# Patient Record
Sex: Female | Born: 1990
Health system: Southern US, Community
[De-identification: ages and names within clinical notes are randomized; demographics above are authoritative.]

## PROBLEM LIST (undated history)

## (undated) ENCOUNTER — Emergency Department (HOSPITAL_BASED_OUTPATIENT_CLINIC_OR_DEPARTMENT_OTHER): Discharge status: Absent without leave | Payer: PPO

## (undated) ENCOUNTER — Inpatient Hospital Stay (HOSPITAL_COMMUNITY): Payer: Self-pay

## (undated) ENCOUNTER — Emergency Department (HOSPITAL_COMMUNITY): Admission: EM | Payer: PPO | Source: Home / Self Care

## (undated) DIAGNOSIS — R51 Headache: Secondary | ICD-10-CM

## (undated) DIAGNOSIS — F259 Schizoaffective disorder, unspecified: Secondary | ICD-10-CM

## (undated) DIAGNOSIS — T391X1A Poisoning by 4-Aminophenol derivatives, accidental (unintentional), initial encounter: Secondary | ICD-10-CM

## (undated) DIAGNOSIS — K5909 Other constipation: Secondary | ICD-10-CM

## (undated) DIAGNOSIS — F419 Anxiety disorder, unspecified: Secondary | ICD-10-CM

## (undated) DIAGNOSIS — K219 Gastro-esophageal reflux disease without esophagitis: Secondary | ICD-10-CM

## (undated) DIAGNOSIS — R9431 Abnormal electrocardiogram [ECG] [EKG]: Secondary | ICD-10-CM

## (undated) DIAGNOSIS — G250 Essential tremor: Secondary | ICD-10-CM

## (undated) DIAGNOSIS — R569 Unspecified convulsions: Secondary | ICD-10-CM

## (undated) DIAGNOSIS — I480 Paroxysmal atrial fibrillation: Secondary | ICD-10-CM

## (undated) DIAGNOSIS — G4730 Sleep apnea, unspecified: Secondary | ICD-10-CM

## (undated) DIAGNOSIS — F609 Personality disorder, unspecified: Secondary | ICD-10-CM

## (undated) DIAGNOSIS — J45909 Unspecified asthma, uncomplicated: Secondary | ICD-10-CM

## (undated) DIAGNOSIS — R45851 Suicidal ideations: Secondary | ICD-10-CM

## (undated) DIAGNOSIS — F329 Major depressive disorder, single episode, unspecified: Secondary | ICD-10-CM

## (undated) DIAGNOSIS — F250 Schizoaffective disorder, bipolar type: Secondary | ICD-10-CM

## (undated) DIAGNOSIS — Z765 Malingerer [conscious simulation]: Secondary | ICD-10-CM

## (undated) DIAGNOSIS — F32A Depression, unspecified: Secondary | ICD-10-CM

## (undated) DIAGNOSIS — G709 Myoneural disorder, unspecified: Secondary | ICD-10-CM

## (undated) DIAGNOSIS — R519 Headache, unspecified: Secondary | ICD-10-CM

## (undated) DIAGNOSIS — F840 Autistic disorder: Secondary | ICD-10-CM

## (undated) DIAGNOSIS — Z148 Genetic carrier of other disease: Secondary | ICD-10-CM

## (undated) HISTORY — DX: Headache: R51

## (undated) HISTORY — DX: Unspecified convulsions: R56.9

## (undated) HISTORY — DX: Headache, unspecified: R51.9

## (undated) HISTORY — DX: Genetic carrier of other disease: Z14.8

## (undated) HISTORY — PX: MOUTH SURGERY: SHX715

## (undated) HISTORY — DX: Sleep apnea, unspecified: G47.30

---
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
## 2008-08-07 ENCOUNTER — Emergency Department (HOSPITAL_COMMUNITY): Admission: EM | Admit: 2008-08-07 | Discharge: 2008-08-07 | Payer: Self-pay | Admitting: Emergency Medicine

## 2008-08-08 ENCOUNTER — Emergency Department (HOSPITAL_BASED_OUTPATIENT_CLINIC_OR_DEPARTMENT_OTHER): Admission: EM | Admit: 2008-08-08 | Discharge: 2008-08-08 | Payer: Self-pay | Admitting: Emergency Medicine

## 2008-08-08 ENCOUNTER — Emergency Department (HOSPITAL_COMMUNITY): Admission: EM | Admit: 2008-08-08 | Discharge: 2008-08-08 | Payer: Self-pay | Admitting: Emergency Medicine

## 2008-11-13 ENCOUNTER — Emergency Department (HOSPITAL_COMMUNITY): Admission: EM | Admit: 2008-11-13 | Discharge: 2008-11-15 | Payer: Self-pay | Admitting: Emergency Medicine

## 2008-12-09 ENCOUNTER — Emergency Department (HOSPITAL_COMMUNITY): Admission: EM | Admit: 2008-12-09 | Discharge: 2008-12-09 | Payer: Self-pay | Admitting: Emergency Medicine

## 2008-12-18 ENCOUNTER — Emergency Department (HOSPITAL_COMMUNITY): Admission: EM | Admit: 2008-12-18 | Discharge: 2008-12-18 | Payer: Self-pay | Admitting: Emergency Medicine

## 2008-12-29 ENCOUNTER — Emergency Department (HOSPITAL_COMMUNITY): Admission: EM | Admit: 2008-12-29 | Discharge: 2008-12-29 | Payer: Self-pay | Admitting: Emergency Medicine

## 2010-05-10 LAB — COMPREHENSIVE METABOLIC PANEL
ALT: 19 U/L (ref 0–35)
Albumin: 4.3 g/dL (ref 3.5–5.2)
Calcium: 9.5 mg/dL (ref 8.4–10.5)
GFR calc Af Amer: >60 mL/min (ref >60)
Glucose, Bld: 101 mg/dL — ABNORMAL HIGH (ref 70–99)
Sodium: 137 mEq/L (ref 135–145)
Total Protein: 7.2 g/dL (ref 6.0–8.3)

## 2010-05-10 LAB — URINALYSIS, ROUTINE W REFLEX MICROSCOPIC
Bilirubin Urine: NEGATIVE
Glucose, UA: NEGATIVE mg/dL
Ketones, ur: NEGATIVE mg/dL
Ketones, ur: NEGATIVE mg/dL
Leukocytes, UA: NEGATIVE
Nitrite: NEGATIVE
Nitrite: NEGATIVE
Protein, ur: NEGATIVE mg/dL
Protein, ur: NEGATIVE mg/dL
Urobilinogen, UA: 0.2 mg/dL (ref 0.0–1.0)
Urobilinogen, UA: 0.2 mg/dL (ref 0.0–1.0)
pH: 5.5 (ref 5.0–8.0)

## 2010-05-10 LAB — RAPID URINE DRUG SCREEN, HOSP PERFORMED
Amphetamines: NOT DETECTED
Benzodiazepines: POSITIVE — AB
Tetrahydrocannabinol: NOT DETECTED

## 2010-05-10 LAB — PREGNANCY, URINE: Preg Test, Ur: NEGATIVE

## 2010-05-10 LAB — DIFFERENTIAL
Eosinophils Absolute: 0 10*3/uL (ref 0.0–0.7)
Lymphs Abs: 2.4 10*3/uL (ref 0.7–4.0)
Monocytes Absolute: 0.4 10*3/uL (ref 0.1–1.0)
Monocytes Relative: 5 % (ref 3–12)
Neutro Abs: 5.7 10*3/uL (ref 1.7–7.7)
Neutrophils Relative %: 67 % (ref 43–77)

## 2010-05-10 LAB — CBC
Hemoglobin: 12.6 g/dL (ref 12.0–15.0)
MCHC: 33.3 g/dL (ref 30.0–36.0)
Platelets: 250 10*3/uL (ref 150–400)
RDW: 12.6 % (ref 11.5–15.5)

## 2010-05-10 LAB — URINE MICROSCOPIC-ADD ON

## 2010-05-10 LAB — ETHANOL: Alcohol, Ethyl (B): <5 mg/dL (ref 0–10)

## 2010-05-11 LAB — POCT I-STAT, CHEM 8
Creatinine, Ser: 0.6 mg/dL (ref 0.4–1.2)
HCT: 36 % (ref 36.0–46.0)
Hemoglobin: 12.2 g/dL (ref 12.0–15.0)
Potassium: 3.6 mEq/L (ref 3.5–5.1)
Sodium: 142 mEq/L (ref 135–145)

## 2010-05-11 LAB — CBC
MCV: 81.6 fL (ref 78.0–100.0)
Platelets: 255 10*3/uL (ref 150–400)
WBC: 9.3 10*3/uL (ref 4.0–10.5)

## 2010-05-11 LAB — RAPID URINE DRUG SCREEN, HOSP PERFORMED
Amphetamines: NOT DETECTED
Tetrahydrocannabinol: NOT DETECTED

## 2010-05-11 LAB — DIFFERENTIAL
Eosinophils Absolute: 0.2 10*3/uL (ref 0.0–0.7)
Lymphocytes Relative: 39 % (ref 12–46)
Lymphs Abs: 3.6 10*3/uL (ref 0.7–4.0)
Neutrophils Relative %: 50 % (ref 43–77)

## 2010-05-11 LAB — ETHANOL: Alcohol, Ethyl (B): <5 mg/dL (ref 0–10)

## 2010-05-11 LAB — TRICYCLICS SCREEN, URINE: TCA Scrn: NOT DETECTED

## 2010-05-14 LAB — URINALYSIS, ROUTINE W REFLEX MICROSCOPIC
Glucose, UA: NEGATIVE mg/dL
Ketones, ur: NEGATIVE mg/dL
Leukocytes, UA: NEGATIVE
Protein, ur: NEGATIVE mg/dL
Specific Gravity, Urine: 1.027 (ref 1.005–1.030)
Urobilinogen, UA: 1 mg/dL (ref 0.0–1.0)
Urobilinogen, UA: 1 mg/dL (ref 0.0–1.0)

## 2010-05-14 LAB — BASIC METABOLIC PANEL
BUN: 3 mg/dL — ABNORMAL LOW (ref 6–23)
Calcium: 9.6 mg/dL (ref 8.4–10.5)
Creatinine, Ser: 0.6 mg/dL (ref 0.4–1.2)
GFR calc non Af Amer: >60 mL/min (ref >60)
Glucose, Bld: 101 mg/dL — ABNORMAL HIGH (ref 70–99)
Potassium: 4 mEq/L (ref 3.5–5.1)

## 2010-05-14 LAB — URINE CULTURE: Colony Count: 50000

## 2010-05-14 LAB — CBC
HCT: 36 % (ref 36.0–46.0)
Platelets: 189 10*3/uL (ref 150–400)
RDW: 11.3 % — ABNORMAL LOW (ref 11.5–15.5)
WBC: 6.2 10*3/uL (ref 4.0–10.5)

## 2010-05-14 LAB — WET PREP, GENITAL
Clue Cells Wet Prep HPF POC: NONE SEEN
Trich, Wet Prep: NONE SEEN

## 2010-05-14 LAB — URINE MICROSCOPIC-ADD ON

## 2010-05-14 LAB — DIFFERENTIAL
Basophils Absolute: 0.1 10*3/uL (ref 0.0–0.1)
Lymphocytes Relative: 45 % (ref 12–46)
Lymphs Abs: 2.7 10*3/uL (ref 0.7–4.0)
Neutro Abs: 2.8 10*3/uL (ref 1.7–7.7)
Neutrophils Relative %: 46 % (ref 43–77)

## 2010-05-14 LAB — POCT TOXICOLOGY PANEL

## 2010-05-14 LAB — ETHANOL: Alcohol, Ethyl (B): <5 mg/dL (ref 0–10)

## 2010-05-14 LAB — POCT PREGNANCY, URINE: Preg Test, Ur: NEGATIVE

## 2010-05-15 LAB — COMPREHENSIVE METABOLIC PANEL
Albumin: 3.8 g/dL (ref 3.5–5.2)
Alkaline Phosphatase: 47 U/L (ref 39–117)
BUN: 2 mg/dL — ABNORMAL LOW (ref 6–23)
CO2: 22 mEq/L (ref 19–32)
Chloride: 110 mEq/L (ref 96–112)
Creatinine, Ser: 0.79 mg/dL (ref 0.4–1.2)
GFR calc non Af Amer: >60 mL/min (ref >60)
Potassium: 2.9 mEq/L — ABNORMAL LOW (ref 3.5–5.1)
Total Bilirubin: 0.2 mg/dL — ABNORMAL LOW (ref 0.3–1.2)

## 2010-05-15 LAB — URINALYSIS, ROUTINE W REFLEX MICROSCOPIC
Glucose, UA: NEGATIVE mg/dL
Hgb urine dipstick: NEGATIVE
Ketones, ur: NEGATIVE mg/dL
Protein, ur: NEGATIVE mg/dL
Urobilinogen, UA: 1 mg/dL (ref 0.0–1.0)

## 2010-05-15 LAB — CBC
HCT: 34.4 % — ABNORMAL LOW (ref 36.0–46.0)
Hemoglobin: 11.6 g/dL — ABNORMAL LOW (ref 12.0–15.0)
MCV: 85.7 fL (ref 78.0–100.0)
Platelets: 196 10*3/uL (ref 150–400)
RBC: 4.01 MIL/uL (ref 3.87–5.11)
WBC: 7 10*3/uL (ref 4.0–10.5)

## 2010-05-15 LAB — URINE CULTURE: Colony Count: >=100000

## 2010-05-15 LAB — URINE MICROSCOPIC-ADD ON

## 2010-05-15 LAB — POCT PREGNANCY, URINE: Preg Test, Ur: NEGATIVE
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
## 2010-06-20 NOTE — Discharge Summary (Signed)
NAMEMarland Kitchen  LOVELLA, HARDIE NO.:  [id number]   MEDICAL RECORD NO.:  [id number]          PATIENT TYPE:  IPS   LOCATION:  0602                          FACILITY:  BH   PHYSICIAN:  Jasmine Pang, M.D. DATE OF BIRTH:  1990-04-22   DATE OF ADMISSION:  07/21/2007  DATE OF DISCHARGE:  07/25/2007                               DISCHARGE SUMMARY   IDENTIFICATION:  This is an 20 year old single white female from  [place] who was admitted on a voluntary basis on July 21, 2007.   HISTORY OF PRESENT ILLNESS:  The patient admits being depressed and  began to have thoughts of wanting harm herself for the past week.  She  feels her medications are not working.  She reports a history of cutting  herself.  She attempted to cut her wrist with her fingernails.   PAST PSYCHIATRIC HISTORY:  This is the first [facility] admission for the  patient.  She sees Dr. Cheree Ditto at [facility].  She  sees Designer, fashion/clothing at [facility].  She was on Abilify at  one point, but did not like this.   FAMILY HISTORY:  Father has depression.   DRUG AND ALCOHOL HISTORY:  No known drug or alcohol use.   MEDICAL PROBLEMS:  None known.   MEDICATIONS:  1. Trazodone 50 mg daily.  2. Zoloft 150 mg daily.  3. Abilify 5 mg in the afternoon.   DRUG ALLERGIES:  No known drug allergies.   PHYSICAL FINDINGS:  There were no acute physical or medical problems  noted on physical exam.   DIAGNOSTIC STUDIES:  There was 2+ bacteria on urinalysis.  CBC revealed  hematocrit of 35.5.  Alcohol was less than 0.01.  C-MET was within  normal limits.   HOSPITAL COURSE:  Upon admission, the patient was restarted on her home  medications, trazodone 50 mg p.o. q.h.s., Abilify 10 mg p.o. b.i.d. and  5 mg in the afternoon, Zoloft 150 mg daily, and Keflex 250 mg p.o.  q.i.d. x7 days.  On July 21, 2007, trazodone was discontinued.  She was  started on Ambien 10 mg p.o. q.h.s. p.r.n. insomnia  with may repeat if  needed.  On July 22, 2007, the Abilify was discontinued.  She was  started on Risperdal M-Tab 0.5 mg p.o. q.6 hours p.r.n. anxiety or  agitation and Risperdal 1 mg p.o. q.h.s. as a standing order.  On July 24, 2007, the Risperdal order was changed because she wanted to be on a  standing dose.  During the day, she was started on Risperdal 0.5 mg p.o.  q.a.m. and q. noon and 1 mg p.o. q.h.s.  She was also started on  ibuprofen 400 mg p.o. q.6 hours p.r.n. headache.  In individual sessions  with me, the patient was reserved, but cooperative.  She was upset at  other peers and wanted to hit them.  She has been on various medications  since November 12, 2006.  She scratched herself on her left wrist and then  called for help.  She states she graduated last  week.  She is going to  [employer] this fall.  She denies any drugs or alcohol use.  As hospitalization  progressed, she discussed living in her group home.  She likes the  staff, but appears get on my nerves.  As hospitalization progressed  further, she became less depressed, less anxious.  She stated that she  felt better even though she had poor eye contact with psychomotor  retardation (this seemed to be baseline for her).  On July 25, 2007,  mental status had improved markedly from admission status.  The patient  was less depressed, less anxious.  Her affect was consistent with mood.  There was no suicidal or homicidal ideation.  No self-injurious  behavior.  No auditory or visual hallucinations.  No paranoia or  delusions.  Thoughts were logical and goal-directed.  Thought content,  no predominant theme.  Cognitive was grossly intact.  The patient was  felt safe for discharge today.   DISCHARGE DIAGNOSES:  Axis I:  Mood disorder, not otherwise specified.  Axis II:  Features of personality disorder, NOS (by history).  Axis III:  None known.  Axis IV:  Moderate (problems related to social environment, burden of  psychiatric  illness).  Axis V:  Global assessment of functioning was 50 upon discharge.  GAF  was 40 upon admission.  GAF highest past year was 60.   DISCHARGE PLANS:  There was no specific activity level or dietary  restrictions.   POSTHOSPITAL CARE PLANS:  The patient will go to the [facility] on July 28, 2007, at 9 a.m. for medication management.  She will also see Raul Del counselor for followup therapy.   DISCHARGE MEDICATIONS:  1. Keflex 250 mg four times daily through July 28, 2007.  2. Risperdal 0.5 mg in the a.m., 0.5 mg at noon, and 1 mg at bedtime.  3. Sertraline 150 mg daily in the a.m.  4. Ambien 10 mg at bedtime.      Jasmine Pang, M.D.  Electronically Signed     BHS/MEDQ  D:  07/25/2007  T:  07/25/2007  Job:  [id number]
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
## 2010-11-02 LAB — HEPATIC FUNCTION PANEL
ALT: 12
Alkaline Phosphatase: 48
Bilirubin, Direct: 0.1
Indirect Bilirubin: 0.6
Total Bilirubin: 0.7

## 2010-11-02 LAB — URINALYSIS, ROUTINE W REFLEX MICROSCOPIC
Glucose, UA: NEGATIVE
Hgb urine dipstick: NEGATIVE
Ketones, ur: NEGATIVE
Protein, ur: NEGATIVE
Urobilinogen, UA: 0.2

## 2010-11-02 LAB — DIFFERENTIAL
Basophils Absolute: 0
Eosinophils Relative: 1
Lymphocytes Relative: 38
Neutro Abs: 3.4
Neutrophils Relative %: 54

## 2010-11-02 LAB — CBC
HCT: 35.1 — ABNORMAL LOW
Platelets: 197
RDW: 14

## 2010-11-02 LAB — VALPROIC ACID LEVEL: Valproic Acid Lvl: 40.2 — ABNORMAL LOW

## 2010-11-13 LAB — COMPREHENSIVE METABOLIC PANEL
ALT: 19
AST: 17
CO2: 28
Calcium: 9.4
GFR calc Af Amer: >60
GFR calc non Af Amer: >60
Sodium: 139
Total Protein: 6.8

## 2010-11-13 LAB — URINALYSIS, ROUTINE W REFLEX MICROSCOPIC
Bilirubin Urine: NEGATIVE
Glucose, UA: NEGATIVE
Hgb urine dipstick: NEGATIVE
Ketones, ur: NEGATIVE
Protein, ur: NEGATIVE

## 2010-11-13 LAB — DIFFERENTIAL
Eosinophils Absolute: 0.1 — ABNORMAL LOW
Eosinophils Relative: 0
Lymphs Abs: 3.2
Monocytes Relative: 5

## 2010-11-13 LAB — CBC
Hemoglobin: 12.6
RDW: 12.5
WBC: 14.3 — ABNORMAL HIGH

## 2011-02-07 DIAGNOSIS — F259 Schizoaffective disorder, unspecified: Secondary | ICD-10-CM | POA: Diagnosis not present

## 2011-02-07 DIAGNOSIS — F988 Other specified behavioral and emotional disorders with onset usually occurring in childhood and adolescence: Secondary | ICD-10-CM | POA: Diagnosis not present

## 2011-02-07 DIAGNOSIS — F848 Other pervasive developmental disorders: Secondary | ICD-10-CM | POA: Diagnosis not present

## 2011-02-09 DIAGNOSIS — B354 Tinea corporis: Secondary | ICD-10-CM | POA: Diagnosis not present

## 2011-03-08 DIAGNOSIS — Z79899 Other long term (current) drug therapy: Secondary | ICD-10-CM | POA: Diagnosis not present

## 2011-03-14 DIAGNOSIS — B958 Unspecified staphylococcus as the cause of diseases classified elsewhere: Secondary | ICD-10-CM | POA: Diagnosis not present

## 2011-03-14 DIAGNOSIS — L0291 Cutaneous abscess, unspecified: Secondary | ICD-10-CM | POA: Diagnosis not present

## 2011-03-14 DIAGNOSIS — L0292 Furuncle, unspecified: Secondary | ICD-10-CM | POA: Diagnosis not present

## 2011-04-13 DIAGNOSIS — Z79899 Other long term (current) drug therapy: Secondary | ICD-10-CM | POA: Diagnosis not present

## 2011-04-29 DIAGNOSIS — F489 Nonpsychotic mental disorder, unspecified: Secondary | ICD-10-CM | POA: Diagnosis not present

## 2011-04-29 DIAGNOSIS — F3289 Other specified depressive episodes: Secondary | ICD-10-CM | POA: Diagnosis not present

## 2011-04-29 DIAGNOSIS — F329 Major depressive disorder, single episode, unspecified: Secondary | ICD-10-CM | POA: Diagnosis not present

## 2011-04-30 DIAGNOSIS — F848 Other pervasive developmental disorders: Secondary | ICD-10-CM | POA: Diagnosis not present

## 2011-04-30 DIAGNOSIS — F259 Schizoaffective disorder, unspecified: Secondary | ICD-10-CM | POA: Diagnosis not present

## 2011-04-30 DIAGNOSIS — F489 Nonpsychotic mental disorder, unspecified: Secondary | ICD-10-CM | POA: Diagnosis not present

## 2011-04-30 DIAGNOSIS — I10 Essential (primary) hypertension: Secondary | ICD-10-CM | POA: Diagnosis present

## 2011-04-30 DIAGNOSIS — K219 Gastro-esophageal reflux disease without esophagitis: Secondary | ICD-10-CM | POA: Diagnosis present

## 2011-04-30 DIAGNOSIS — F603 Borderline personality disorder: Secondary | ICD-10-CM | POA: Diagnosis not present

## 2011-04-30 DIAGNOSIS — F329 Major depressive disorder, single episode, unspecified: Secondary | ICD-10-CM | POA: Diagnosis not present

## 2011-05-09 DIAGNOSIS — R42 Dizziness and giddiness: Secondary | ICD-10-CM | POA: Diagnosis not present

## 2011-05-09 DIAGNOSIS — R109 Unspecified abdominal pain: Secondary | ICD-10-CM | POA: Diagnosis not present

## 2011-05-09 DIAGNOSIS — H6120 Impacted cerumen, unspecified ear: Secondary | ICD-10-CM | POA: Diagnosis not present

## 2011-05-09 DIAGNOSIS — H9209 Otalgia, unspecified ear: Secondary | ICD-10-CM | POA: Diagnosis not present

## 2011-05-09 DIAGNOSIS — K219 Gastro-esophageal reflux disease without esophagitis: Secondary | ICD-10-CM | POA: Diagnosis not present

## 2011-05-14 DIAGNOSIS — F329 Major depressive disorder, single episode, unspecified: Secondary | ICD-10-CM | POA: Diagnosis not present

## 2011-05-14 DIAGNOSIS — F603 Borderline personality disorder: Secondary | ICD-10-CM | POA: Diagnosis not present

## 2011-05-14 DIAGNOSIS — F259 Schizoaffective disorder, unspecified: Secondary | ICD-10-CM | POA: Diagnosis not present

## 2011-05-14 DIAGNOSIS — K219 Gastro-esophageal reflux disease without esophagitis: Secondary | ICD-10-CM | POA: Diagnosis present

## 2011-05-14 DIAGNOSIS — I10 Essential (primary) hypertension: Secondary | ICD-10-CM | POA: Diagnosis not present

## 2011-05-14 DIAGNOSIS — F3289 Other specified depressive episodes: Secondary | ICD-10-CM | POA: Diagnosis not present

## 2011-05-14 DIAGNOSIS — F848 Other pervasive developmental disorders: Secondary | ICD-10-CM | POA: Diagnosis not present

## 2011-05-14 DIAGNOSIS — F3130 Bipolar disorder, current episode depressed, mild or moderate severity, unspecified: Secondary | ICD-10-CM | POA: Diagnosis not present

## 2011-05-14 DIAGNOSIS — F489 Nonpsychotic mental disorder, unspecified: Secondary | ICD-10-CM | POA: Diagnosis not present

## 2011-05-24 DIAGNOSIS — F918 Other conduct disorders: Secondary | ICD-10-CM | POA: Diagnosis not present

## 2011-05-24 DIAGNOSIS — F068 Other specified mental disorders due to known physiological condition: Secondary | ICD-10-CM | POA: Diagnosis not present

## 2011-06-22 DIAGNOSIS — D510 Vitamin B12 deficiency anemia due to intrinsic factor deficiency: Secondary | ICD-10-CM | POA: Diagnosis not present

## 2011-06-29 DIAGNOSIS — D510 Vitamin B12 deficiency anemia due to intrinsic factor deficiency: Secondary | ICD-10-CM | POA: Diagnosis not present

## 2011-06-29 DIAGNOSIS — Z79899 Other long term (current) drug therapy: Secondary | ICD-10-CM | POA: Diagnosis not present

## 2011-07-05 DIAGNOSIS — J309 Allergic rhinitis, unspecified: Secondary | ICD-10-CM | POA: Diagnosis not present

## 2011-07-05 DIAGNOSIS — H00019 Hordeolum externum unspecified eye, unspecified eyelid: Secondary | ICD-10-CM | POA: Diagnosis not present

## 2011-07-05 DIAGNOSIS — F319 Bipolar disorder, unspecified: Secondary | ICD-10-CM | POA: Diagnosis not present

## 2011-07-05 DIAGNOSIS — R12 Heartburn: Secondary | ICD-10-CM | POA: Diagnosis not present

## 2011-07-05 DIAGNOSIS — F603 Borderline personality disorder: Secondary | ICD-10-CM | POA: Diagnosis not present

## 2011-07-06 DIAGNOSIS — D510 Vitamin B12 deficiency anemia due to intrinsic factor deficiency: Secondary | ICD-10-CM | POA: Diagnosis not present

## 2011-07-13 DIAGNOSIS — E538 Deficiency of other specified B group vitamins: Secondary | ICD-10-CM | POA: Diagnosis not present

## 2011-07-15 DIAGNOSIS — F430 Acute stress reaction: Secondary | ICD-10-CM | POA: Diagnosis not present

## 2011-07-23 DIAGNOSIS — E538 Deficiency of other specified B group vitamins: Secondary | ICD-10-CM | POA: Diagnosis not present

## 2011-07-24 DIAGNOSIS — L600 Ingrowing nail: Secondary | ICD-10-CM | POA: Diagnosis not present

## 2011-07-26 DIAGNOSIS — Z79899 Other long term (current) drug therapy: Secondary | ICD-10-CM | POA: Diagnosis not present

## 2011-07-30 DIAGNOSIS — E538 Deficiency of other specified B group vitamins: Secondary | ICD-10-CM | POA: Diagnosis not present

## 2011-08-02 DIAGNOSIS — J309 Allergic rhinitis, unspecified: Secondary | ICD-10-CM | POA: Diagnosis not present

## 2011-08-02 DIAGNOSIS — E538 Deficiency of other specified B group vitamins: Secondary | ICD-10-CM | POA: Diagnosis not present

## 2011-08-02 DIAGNOSIS — L600 Ingrowing nail: Secondary | ICD-10-CM | POA: Diagnosis not present

## 2011-08-06 DIAGNOSIS — E538 Deficiency of other specified B group vitamins: Secondary | ICD-10-CM | POA: Diagnosis not present

## 2011-08-13 DIAGNOSIS — M4000 Postural kyphosis, site unspecified: Secondary | ICD-10-CM | POA: Diagnosis not present

## 2011-08-13 DIAGNOSIS — E538 Deficiency of other specified B group vitamins: Secondary | ICD-10-CM | POA: Diagnosis not present

## 2011-08-19 DIAGNOSIS — F4324 Adjustment disorder with disturbance of conduct: Secondary | ICD-10-CM | POA: Diagnosis not present

## 2011-08-19 DIAGNOSIS — Z8659 Personal history of other mental and behavioral disorders: Secondary | ICD-10-CM | POA: Diagnosis not present

## 2011-08-19 DIAGNOSIS — Z888 Allergy status to other drugs, medicaments and biological substances status: Secondary | ICD-10-CM | POA: Diagnosis not present

## 2011-08-19 DIAGNOSIS — F329 Major depressive disorder, single episode, unspecified: Secondary | ICD-10-CM | POA: Diagnosis not present

## 2011-08-29 DIAGNOSIS — M545 Low back pain: Secondary | ICD-10-CM | POA: Diagnosis not present

## 2011-08-29 DIAGNOSIS — I889 Nonspecific lymphadenitis, unspecified: Secondary | ICD-10-CM | POA: Diagnosis not present

## 2011-08-29 DIAGNOSIS — R509 Fever, unspecified: Secondary | ICD-10-CM | POA: Diagnosis not present

## 2011-08-29 DIAGNOSIS — L259 Unspecified contact dermatitis, unspecified cause: Secondary | ICD-10-CM | POA: Diagnosis not present

## 2011-08-29 DIAGNOSIS — N390 Urinary tract infection, site not specified: Secondary | ICD-10-CM | POA: Diagnosis not present

## 2011-08-29 DIAGNOSIS — F319 Bipolar disorder, unspecified: Secondary | ICD-10-CM | POA: Diagnosis not present

## 2011-08-29 DIAGNOSIS — R5081 Fever presenting with conditions classified elsewhere: Secondary | ICD-10-CM | POA: Diagnosis not present

## 2011-08-29 DIAGNOSIS — F603 Borderline personality disorder: Secondary | ICD-10-CM | POA: Diagnosis not present

## 2011-09-06 DIAGNOSIS — F319 Bipolar disorder, unspecified: Secondary | ICD-10-CM | POA: Diagnosis not present

## 2011-09-06 DIAGNOSIS — J309 Allergic rhinitis, unspecified: Secondary | ICD-10-CM | POA: Diagnosis not present

## 2011-09-14 DIAGNOSIS — M545 Low back pain: Secondary | ICD-10-CM | POA: Diagnosis not present

## 2011-09-20 DIAGNOSIS — Z113 Encounter for screening for infections with a predominantly sexual mode of transmission: Secondary | ICD-10-CM | POA: Diagnosis not present

## 2011-09-20 DIAGNOSIS — Z124 Encounter for screening for malignant neoplasm of cervix: Secondary | ICD-10-CM | POA: Diagnosis not present

## 2011-10-06 DIAGNOSIS — Z888 Allergy status to other drugs, medicaments and biological substances status: Secondary | ICD-10-CM | POA: Diagnosis not present

## 2011-10-06 DIAGNOSIS — D649 Anemia, unspecified: Secondary | ICD-10-CM | POA: Diagnosis not present

## 2011-10-06 DIAGNOSIS — F29 Unspecified psychosis not due to a substance or known physiological condition: Secondary | ICD-10-CM | POA: Diagnosis not present

## 2011-10-06 DIAGNOSIS — I959 Hypotension, unspecified: Secondary | ICD-10-CM | POA: Diagnosis not present

## 2011-10-06 DIAGNOSIS — F2089 Other schizophrenia: Secondary | ICD-10-CM | POA: Diagnosis not present

## 2011-10-06 DIAGNOSIS — K219 Gastro-esophageal reflux disease without esophagitis: Secondary | ICD-10-CM | POA: Diagnosis not present

## 2011-10-06 DIAGNOSIS — F919 Conduct disorder, unspecified: Secondary | ICD-10-CM | POA: Diagnosis not present

## 2011-10-07 DIAGNOSIS — F259 Schizoaffective disorder, unspecified: Secondary | ICD-10-CM | POA: Diagnosis not present

## 2011-10-15 DIAGNOSIS — R599 Enlarged lymph nodes, unspecified: Secondary | ICD-10-CM | POA: Diagnosis not present

## 2011-10-15 DIAGNOSIS — R0609 Other forms of dyspnea: Secondary | ICD-10-CM | POA: Diagnosis not present

## 2011-10-15 DIAGNOSIS — R0989 Other specified symptoms and signs involving the circulatory and respiratory systems: Secondary | ICD-10-CM | POA: Diagnosis not present

## 2011-10-15 DIAGNOSIS — R918 Other nonspecific abnormal finding of lung field: Secondary | ICD-10-CM | POA: Diagnosis not present

## 2011-10-22 DIAGNOSIS — F848 Other pervasive developmental disorders: Secondary | ICD-10-CM | POA: Diagnosis not present

## 2011-10-22 DIAGNOSIS — Z733 Stress, not elsewhere classified: Secondary | ICD-10-CM | POA: Diagnosis not present

## 2011-10-25 DIAGNOSIS — J029 Acute pharyngitis, unspecified: Secondary | ICD-10-CM | POA: Diagnosis not present

## 2011-10-31 DIAGNOSIS — R000 Tachycardia, unspecified: Secondary | ICD-10-CM | POA: Diagnosis not present

## 2011-11-08 DIAGNOSIS — J45909 Unspecified asthma, uncomplicated: Secondary | ICD-10-CM | POA: Diagnosis not present

## 2011-11-29 DIAGNOSIS — F259 Schizoaffective disorder, unspecified: Secondary | ICD-10-CM | POA: Diagnosis not present

## 2011-11-29 DIAGNOSIS — F988 Other specified behavioral and emotional disorders with onset usually occurring in childhood and adolescence: Secondary | ICD-10-CM | POA: Diagnosis not present

## 2011-11-29 DIAGNOSIS — F848 Other pervasive developmental disorders: Secondary | ICD-10-CM | POA: Diagnosis not present

## 2011-12-05 DIAGNOSIS — L03319 Cellulitis of trunk, unspecified: Secondary | ICD-10-CM | POA: Diagnosis not present

## 2011-12-05 DIAGNOSIS — L02219 Cutaneous abscess of trunk, unspecified: Secondary | ICD-10-CM | POA: Diagnosis not present

## 2011-12-05 DIAGNOSIS — Z23 Encounter for immunization: Secondary | ICD-10-CM | POA: Diagnosis not present

## 2011-12-13 DIAGNOSIS — D518 Other vitamin B12 deficiency anemias: Secondary | ICD-10-CM | POA: Diagnosis not present

## 2011-12-13 DIAGNOSIS — G250 Essential tremor: Secondary | ICD-10-CM | POA: Diagnosis not present

## 2011-12-13 DIAGNOSIS — G252 Other specified forms of tremor: Secondary | ICD-10-CM | POA: Diagnosis not present

## 2011-12-14 DIAGNOSIS — T50901A Poisoning by unspecified drugs, medicaments and biological substances, accidental (unintentional), initial encounter: Secondary | ICD-10-CM | POA: Diagnosis not present

## 2011-12-14 DIAGNOSIS — T56894A Toxic effect of other metals, undetermined, initial encounter: Secondary | ICD-10-CM | POA: Diagnosis not present

## 2011-12-14 DIAGNOSIS — T438X4A Poisoning by other psychotropic drugs, undetermined, initial encounter: Secondary | ICD-10-CM | POA: Diagnosis not present

## 2011-12-14 DIAGNOSIS — R9431 Abnormal electrocardiogram [ECG] [EKG]: Secondary | ICD-10-CM | POA: Diagnosis not present

## 2011-12-15 DIAGNOSIS — F329 Major depressive disorder, single episode, unspecified: Secondary | ICD-10-CM | POA: Diagnosis not present

## 2011-12-15 DIAGNOSIS — R000 Tachycardia, unspecified: Secondary | ICD-10-CM | POA: Diagnosis not present

## 2011-12-15 DIAGNOSIS — R259 Unspecified abnormal involuntary movements: Secondary | ICD-10-CM | POA: Diagnosis present

## 2011-12-15 DIAGNOSIS — T50901A Poisoning by unspecified drugs, medicaments and biological substances, accidental (unintentional), initial encounter: Secondary | ICD-10-CM | POA: Diagnosis not present

## 2011-12-15 DIAGNOSIS — G253 Myoclonus: Secondary | ICD-10-CM | POA: Diagnosis present

## 2011-12-15 DIAGNOSIS — Z6836 Body mass index (BMI) 36.0-36.9, adult: Secondary | ICD-10-CM | POA: Diagnosis not present

## 2011-12-15 DIAGNOSIS — T438X4A Poisoning by other psychotropic drugs, undetermined, initial encounter: Secondary | ICD-10-CM | POA: Diagnosis not present

## 2011-12-15 DIAGNOSIS — F840 Autistic disorder: Secondary | ICD-10-CM | POA: Diagnosis not present

## 2011-12-15 DIAGNOSIS — E669 Obesity, unspecified: Secondary | ICD-10-CM | POA: Diagnosis present

## 2011-12-15 DIAGNOSIS — R112 Nausea with vomiting, unspecified: Secondary | ICD-10-CM | POA: Diagnosis present

## 2011-12-15 DIAGNOSIS — G252 Other specified forms of tremor: Secondary | ICD-10-CM | POA: Diagnosis not present

## 2011-12-15 DIAGNOSIS — D649 Anemia, unspecified: Secondary | ICD-10-CM | POA: Diagnosis present

## 2011-12-15 DIAGNOSIS — R4782 Fluency disorder in conditions classified elsewhere: Secondary | ICD-10-CM | POA: Diagnosis present

## 2011-12-15 DIAGNOSIS — T56894A Toxic effect of other metals, undetermined, initial encounter: Secondary | ICD-10-CM | POA: Diagnosis not present

## 2011-12-15 DIAGNOSIS — T438X1A Poisoning by other psychotropic drugs, accidental (unintentional), initial encounter: Secondary | ICD-10-CM | POA: Diagnosis not present

## 2011-12-15 DIAGNOSIS — F17200 Nicotine dependence, unspecified, uncomplicated: Secondary | ICD-10-CM | POA: Diagnosis present

## 2011-12-15 DIAGNOSIS — F319 Bipolar disorder, unspecified: Secondary | ICD-10-CM | POA: Diagnosis not present

## 2011-12-17 DIAGNOSIS — F319 Bipolar disorder, unspecified: Secondary | ICD-10-CM | POA: Diagnosis not present

## 2011-12-19 DIAGNOSIS — K644 Residual hemorrhoidal skin tags: Secondary | ICD-10-CM | POA: Diagnosis not present

## 2011-12-20 DIAGNOSIS — K649 Unspecified hemorrhoids: Secondary | ICD-10-CM | POA: Diagnosis not present

## 2011-12-21 DIAGNOSIS — T50992A Poisoning by other drugs, medicaments and biological substances, intentional self-harm, initial encounter: Secondary | ICD-10-CM | POA: Diagnosis not present

## 2011-12-21 DIAGNOSIS — F205 Residual schizophrenia: Secondary | ICD-10-CM | POA: Diagnosis not present

## 2011-12-21 DIAGNOSIS — F2089 Other schizophrenia: Secondary | ICD-10-CM | POA: Diagnosis not present

## 2011-12-21 DIAGNOSIS — F209 Schizophrenia, unspecified: Secondary | ICD-10-CM | POA: Diagnosis not present

## 2011-12-21 DIAGNOSIS — F603 Borderline personality disorder: Secondary | ICD-10-CM | POA: Diagnosis not present

## 2011-12-21 DIAGNOSIS — K219 Gastro-esophageal reflux disease without esophagitis: Secondary | ICD-10-CM | POA: Diagnosis not present

## 2011-12-21 DIAGNOSIS — D649 Anemia, unspecified: Secondary | ICD-10-CM | POA: Diagnosis not present

## 2011-12-21 DIAGNOSIS — F339 Major depressive disorder, recurrent, unspecified: Secondary | ICD-10-CM | POA: Diagnosis not present

## 2011-12-21 DIAGNOSIS — F848 Other pervasive developmental disorders: Secondary | ICD-10-CM | POA: Diagnosis not present

## 2011-12-21 DIAGNOSIS — E538 Deficiency of other specified B group vitamins: Secondary | ICD-10-CM | POA: Diagnosis not present

## 2011-12-21 DIAGNOSIS — F29 Unspecified psychosis not due to a substance or known physiological condition: Secondary | ICD-10-CM | POA: Diagnosis not present

## 2011-12-21 DIAGNOSIS — Z733 Stress, not elsewhere classified: Secondary | ICD-10-CM | POA: Diagnosis not present

## 2011-12-21 DIAGNOSIS — F489 Nonpsychotic mental disorder, unspecified: Secondary | ICD-10-CM | POA: Diagnosis not present

## 2011-12-26 DIAGNOSIS — Z733 Stress, not elsewhere classified: Secondary | ICD-10-CM | POA: Diagnosis not present

## 2011-12-26 DIAGNOSIS — F848 Other pervasive developmental disorders: Secondary | ICD-10-CM | POA: Diagnosis not present

## 2012-01-10 DIAGNOSIS — F848 Other pervasive developmental disorders: Secondary | ICD-10-CM | POA: Diagnosis not present

## 2012-01-10 DIAGNOSIS — F988 Other specified behavioral and emotional disorders with onset usually occurring in childhood and adolescence: Secondary | ICD-10-CM | POA: Diagnosis not present

## 2012-01-10 DIAGNOSIS — F259 Schizoaffective disorder, unspecified: Secondary | ICD-10-CM | POA: Diagnosis not present

## 2012-01-17 DIAGNOSIS — F209 Schizophrenia, unspecified: Secondary | ICD-10-CM | POA: Diagnosis not present

## 2012-01-17 DIAGNOSIS — D518 Other vitamin B12 deficiency anemias: Secondary | ICD-10-CM | POA: Diagnosis not present

## 2012-01-17 DIAGNOSIS — F3289 Other specified depressive episodes: Secondary | ICD-10-CM | POA: Diagnosis not present

## 2012-01-17 DIAGNOSIS — N760 Acute vaginitis: Secondary | ICD-10-CM | POA: Diagnosis not present

## 2012-01-17 DIAGNOSIS — N390 Urinary tract infection, site not specified: Secondary | ICD-10-CM | POA: Diagnosis not present

## 2012-01-17 DIAGNOSIS — K649 Unspecified hemorrhoids: Secondary | ICD-10-CM | POA: Diagnosis not present

## 2012-01-18 DIAGNOSIS — F17200 Nicotine dependence, unspecified, uncomplicated: Secondary | ICD-10-CM | POA: Diagnosis not present

## 2012-01-18 DIAGNOSIS — F329 Major depressive disorder, single episode, unspecified: Secondary | ICD-10-CM | POA: Diagnosis not present

## 2012-01-18 DIAGNOSIS — N390 Urinary tract infection, site not specified: Secondary | ICD-10-CM | POA: Diagnosis not present

## 2012-01-18 DIAGNOSIS — F209 Schizophrenia, unspecified: Secondary | ICD-10-CM | POA: Diagnosis not present

## 2012-01-18 DIAGNOSIS — F2089 Other schizophrenia: Secondary | ICD-10-CM | POA: Diagnosis not present

## 2012-01-18 DIAGNOSIS — F29 Unspecified psychosis not due to a substance or known physiological condition: Secondary | ICD-10-CM | POA: Diagnosis not present

## 2012-01-18 DIAGNOSIS — R45851 Suicidal ideations: Secondary | ICD-10-CM | POA: Diagnosis not present

## 2012-01-18 DIAGNOSIS — N760 Acute vaginitis: Secondary | ICD-10-CM | POA: Diagnosis not present

## 2012-01-18 DIAGNOSIS — F201 Disorganized schizophrenia: Secondary | ICD-10-CM | POA: Diagnosis present

## 2012-01-18 DIAGNOSIS — F603 Borderline personality disorder: Secondary | ICD-10-CM | POA: Diagnosis not present

## 2012-02-14 DIAGNOSIS — F988 Other specified behavioral and emotional disorders with onset usually occurring in childhood and adolescence: Secondary | ICD-10-CM | POA: Diagnosis not present

## 2012-02-14 DIAGNOSIS — Z8659 Personal history of other mental and behavioral disorders: Secondary | ICD-10-CM | POA: Diagnosis not present

## 2012-02-14 DIAGNOSIS — F848 Other pervasive developmental disorders: Secondary | ICD-10-CM | POA: Diagnosis not present

## 2012-02-14 DIAGNOSIS — K219 Gastro-esophageal reflux disease without esophagitis: Secondary | ICD-10-CM | POA: Diagnosis not present

## 2012-02-14 DIAGNOSIS — D649 Anemia, unspecified: Secondary | ICD-10-CM | POA: Diagnosis not present

## 2012-02-14 DIAGNOSIS — F17200 Nicotine dependence, unspecified, uncomplicated: Secondary | ICD-10-CM | POA: Diagnosis not present

## 2012-02-14 DIAGNOSIS — F259 Schizoaffective disorder, unspecified: Secondary | ICD-10-CM | POA: Diagnosis not present

## 2012-02-14 DIAGNOSIS — J069 Acute upper respiratory infection, unspecified: Secondary | ICD-10-CM | POA: Diagnosis not present

## 2012-02-14 DIAGNOSIS — Z888 Allergy status to other drugs, medicaments and biological substances status: Secondary | ICD-10-CM | POA: Diagnosis not present

## 2012-02-16 DIAGNOSIS — K219 Gastro-esophageal reflux disease without esophagitis: Secondary | ICD-10-CM | POA: Diagnosis not present

## 2012-02-16 DIAGNOSIS — Z8659 Personal history of other mental and behavioral disorders: Secondary | ICD-10-CM | POA: Diagnosis not present

## 2012-02-16 DIAGNOSIS — F4321 Adjustment disorder with depressed mood: Secondary | ICD-10-CM | POA: Diagnosis not present

## 2012-02-16 DIAGNOSIS — F4325 Adjustment disorder with mixed disturbance of emotions and conduct: Secondary | ICD-10-CM | POA: Diagnosis not present

## 2012-02-16 DIAGNOSIS — F919 Conduct disorder, unspecified: Secondary | ICD-10-CM | POA: Diagnosis not present

## 2012-02-16 DIAGNOSIS — F4322 Adjustment disorder with anxiety: Secondary | ICD-10-CM | POA: Diagnosis not present

## 2012-02-16 DIAGNOSIS — Z888 Allergy status to other drugs, medicaments and biological substances status: Secondary | ICD-10-CM | POA: Diagnosis not present

## 2012-02-16 DIAGNOSIS — D649 Anemia, unspecified: Secondary | ICD-10-CM | POA: Diagnosis not present

## 2012-02-19 DIAGNOSIS — F319 Bipolar disorder, unspecified: Secondary | ICD-10-CM | POA: Diagnosis not present

## 2012-02-19 DIAGNOSIS — D518 Other vitamin B12 deficiency anemias: Secondary | ICD-10-CM | POA: Diagnosis not present

## 2012-02-22 DIAGNOSIS — Z049 Encounter for examination and observation for unspecified reason: Secondary | ICD-10-CM | POA: Diagnosis not present

## 2012-02-22 DIAGNOSIS — M545 Low back pain: Secondary | ICD-10-CM | POA: Diagnosis not present

## 2012-02-25 DIAGNOSIS — Z111 Encounter for screening for respiratory tuberculosis: Secondary | ICD-10-CM | POA: Diagnosis not present

## 2012-03-08 DIAGNOSIS — D649 Anemia, unspecified: Secondary | ICD-10-CM | POA: Diagnosis not present

## 2012-03-08 DIAGNOSIS — Z8659 Personal history of other mental and behavioral disorders: Secondary | ICD-10-CM | POA: Diagnosis not present

## 2012-03-08 DIAGNOSIS — F209 Schizophrenia, unspecified: Secondary | ICD-10-CM | POA: Diagnosis not present

## 2012-03-08 DIAGNOSIS — Z888 Allergy status to other drugs, medicaments and biological substances status: Secondary | ICD-10-CM | POA: Diagnosis not present

## 2012-03-08 DIAGNOSIS — K219 Gastro-esophageal reflux disease without esophagitis: Secondary | ICD-10-CM | POA: Diagnosis not present

## 2012-03-18 DIAGNOSIS — F319 Bipolar disorder, unspecified: Secondary | ICD-10-CM | POA: Diagnosis not present

## 2012-03-18 DIAGNOSIS — D518 Other vitamin B12 deficiency anemias: Secondary | ICD-10-CM | POA: Diagnosis not present

## 2012-03-18 DIAGNOSIS — Z79899 Other long term (current) drug therapy: Secondary | ICD-10-CM | POA: Diagnosis not present

## 2012-04-07 DIAGNOSIS — Z79899 Other long term (current) drug therapy: Secondary | ICD-10-CM | POA: Diagnosis not present

## 2012-04-16 DIAGNOSIS — D518 Other vitamin B12 deficiency anemias: Secondary | ICD-10-CM | POA: Diagnosis not present

## 2012-04-16 DIAGNOSIS — Z79899 Other long term (current) drug therapy: Secondary | ICD-10-CM | POA: Diagnosis not present

## 2012-04-23 DIAGNOSIS — R509 Fever, unspecified: Secondary | ICD-10-CM | POA: Diagnosis not present

## 2012-04-23 DIAGNOSIS — M545 Low back pain: Secondary | ICD-10-CM | POA: Diagnosis not present

## 2012-04-28 DIAGNOSIS — Z8659 Personal history of other mental and behavioral disorders: Secondary | ICD-10-CM | POA: Diagnosis not present

## 2012-04-28 DIAGNOSIS — Z888 Allergy status to other drugs, medicaments and biological substances status: Secondary | ICD-10-CM | POA: Diagnosis not present

## 2012-04-28 DIAGNOSIS — R079 Chest pain, unspecified: Secondary | ICD-10-CM | POA: Diagnosis not present

## 2012-04-28 DIAGNOSIS — F4322 Adjustment disorder with anxiety: Secondary | ICD-10-CM | POA: Diagnosis not present

## 2012-04-30 DIAGNOSIS — Z79899 Other long term (current) drug therapy: Secondary | ICD-10-CM | POA: Diagnosis not present

## 2012-05-09 DIAGNOSIS — Z79899 Other long term (current) drug therapy: Secondary | ICD-10-CM | POA: Diagnosis not present

## 2012-05-27 DIAGNOSIS — D518 Other vitamin B12 deficiency anemias: Secondary | ICD-10-CM | POA: Diagnosis not present

## 2012-06-09 DIAGNOSIS — Z79899 Other long term (current) drug therapy: Secondary | ICD-10-CM | POA: Diagnosis not present

## 2012-06-26 DIAGNOSIS — D518 Other vitamin B12 deficiency anemias: Secondary | ICD-10-CM | POA: Diagnosis not present

## 2012-07-10 DIAGNOSIS — Z79899 Other long term (current) drug therapy: Secondary | ICD-10-CM | POA: Diagnosis not present

## 2012-07-17 DIAGNOSIS — E538 Deficiency of other specified B group vitamins: Secondary | ICD-10-CM | POA: Diagnosis not present

## 2012-07-17 DIAGNOSIS — D518 Other vitamin B12 deficiency anemias: Secondary | ICD-10-CM | POA: Diagnosis not present

## 2012-07-29 DIAGNOSIS — Z111 Encounter for screening for respiratory tuberculosis: Secondary | ICD-10-CM | POA: Diagnosis not present

## 2012-07-29 DIAGNOSIS — D518 Other vitamin B12 deficiency anemias: Secondary | ICD-10-CM | POA: Diagnosis not present

## 2012-07-31 DIAGNOSIS — L500 Allergic urticaria: Secondary | ICD-10-CM | POA: Diagnosis not present

## 2012-08-09 DIAGNOSIS — N390 Urinary tract infection, site not specified: Secondary | ICD-10-CM | POA: Diagnosis not present

## 2012-08-09 DIAGNOSIS — R1013 Epigastric pain: Secondary | ICD-10-CM | POA: Diagnosis not present

## 2012-08-09 DIAGNOSIS — Z888 Allergy status to other drugs, medicaments and biological substances status: Secondary | ICD-10-CM | POA: Diagnosis not present

## 2012-08-09 DIAGNOSIS — K219 Gastro-esophageal reflux disease without esophagitis: Secondary | ICD-10-CM | POA: Diagnosis not present

## 2012-08-09 DIAGNOSIS — Z8659 Personal history of other mental and behavioral disorders: Secondary | ICD-10-CM | POA: Diagnosis not present

## 2012-08-09 DIAGNOSIS — R1084 Generalized abdominal pain: Secondary | ICD-10-CM | POA: Diagnosis not present

## 2012-08-09 DIAGNOSIS — R52 Pain, unspecified: Secondary | ICD-10-CM | POA: Diagnosis not present

## 2012-08-11 DIAGNOSIS — Z79899 Other long term (current) drug therapy: Secondary | ICD-10-CM | POA: Diagnosis not present

## 2012-08-28 DIAGNOSIS — Z111 Encounter for screening for respiratory tuberculosis: Secondary | ICD-10-CM | POA: Diagnosis not present

## 2012-08-28 DIAGNOSIS — D518 Other vitamin B12 deficiency anemias: Secondary | ICD-10-CM | POA: Diagnosis not present

## 2012-09-11 DIAGNOSIS — R45851 Suicidal ideations: Secondary | ICD-10-CM | POA: Diagnosis not present

## 2012-09-11 DIAGNOSIS — F329 Major depressive disorder, single episode, unspecified: Secondary | ICD-10-CM | POA: Diagnosis not present

## 2012-09-11 DIAGNOSIS — Z79899 Other long term (current) drug therapy: Secondary | ICD-10-CM | POA: Diagnosis not present

## 2012-09-12 DIAGNOSIS — F259 Schizoaffective disorder, unspecified: Secondary | ICD-10-CM | POA: Diagnosis not present

## 2012-09-12 DIAGNOSIS — F209 Schizophrenia, unspecified: Secondary | ICD-10-CM | POA: Diagnosis not present

## 2012-09-12 DIAGNOSIS — F3130 Bipolar disorder, current episode depressed, mild or moderate severity, unspecified: Secondary | ICD-10-CM | POA: Diagnosis not present

## 2012-09-12 DIAGNOSIS — F607 Dependent personality disorder: Secondary | ICD-10-CM | POA: Diagnosis not present

## 2012-09-12 DIAGNOSIS — I10 Essential (primary) hypertension: Secondary | ICD-10-CM | POA: Diagnosis present

## 2012-09-12 DIAGNOSIS — K219 Gastro-esophageal reflux disease without esophagitis: Secondary | ICD-10-CM | POA: Diagnosis not present

## 2012-09-12 DIAGNOSIS — F329 Major depressive disorder, single episode, unspecified: Secondary | ICD-10-CM | POA: Diagnosis not present

## 2012-09-12 DIAGNOSIS — F603 Borderline personality disorder: Secondary | ICD-10-CM | POA: Diagnosis not present

## 2012-09-12 DIAGNOSIS — R45851 Suicidal ideations: Secondary | ICD-10-CM | POA: Diagnosis not present

## 2012-09-30 DIAGNOSIS — D518 Other vitamin B12 deficiency anemias: Secondary | ICD-10-CM | POA: Diagnosis not present

## 2012-09-30 DIAGNOSIS — J0110 Acute frontal sinusitis, unspecified: Secondary | ICD-10-CM | POA: Diagnosis not present

## 2012-09-30 DIAGNOSIS — F319 Bipolar disorder, unspecified: Secondary | ICD-10-CM | POA: Diagnosis not present

## 2012-09-30 DIAGNOSIS — G250 Essential tremor: Secondary | ICD-10-CM | POA: Diagnosis not present

## 2012-10-07 DIAGNOSIS — T675XXA Heat exhaustion, unspecified, initial encounter: Secondary | ICD-10-CM | POA: Diagnosis not present

## 2012-10-07 DIAGNOSIS — X30XXXA Exposure to excessive natural heat, initial encounter: Secondary | ICD-10-CM | POA: Diagnosis not present

## 2012-10-07 DIAGNOSIS — R42 Dizziness and giddiness: Secondary | ICD-10-CM | POA: Diagnosis not present

## 2012-10-07 DIAGNOSIS — Z8659 Personal history of other mental and behavioral disorders: Secondary | ICD-10-CM | POA: Diagnosis not present

## 2012-10-07 DIAGNOSIS — H2510 Age-related nuclear cataract, unspecified eye: Secondary | ICD-10-CM | POA: Diagnosis not present

## 2012-10-07 DIAGNOSIS — Z888 Allergy status to other drugs, medicaments and biological substances status: Secondary | ICD-10-CM | POA: Diagnosis not present

## 2012-10-07 DIAGNOSIS — Z91018 Allergy to other foods: Secondary | ICD-10-CM | POA: Diagnosis not present

## 2012-10-07 DIAGNOSIS — H1045 Other chronic allergic conjunctivitis: Secondary | ICD-10-CM | POA: Diagnosis not present

## 2012-10-07 DIAGNOSIS — R0609 Other forms of dyspnea: Secondary | ICD-10-CM | POA: Diagnosis not present

## 2012-10-09 DIAGNOSIS — F603 Borderline personality disorder: Secondary | ICD-10-CM | POA: Diagnosis not present

## 2012-10-09 DIAGNOSIS — R109 Unspecified abdominal pain: Secondary | ICD-10-CM | POA: Diagnosis not present

## 2012-10-09 DIAGNOSIS — K5289 Other specified noninfective gastroenteritis and colitis: Secondary | ICD-10-CM | POA: Diagnosis not present

## 2012-10-09 DIAGNOSIS — T675XXA Heat exhaustion, unspecified, initial encounter: Secondary | ICD-10-CM | POA: Diagnosis not present

## 2012-10-09 DIAGNOSIS — R112 Nausea with vomiting, unspecified: Secondary | ICD-10-CM | POA: Diagnosis not present

## 2012-10-10 DIAGNOSIS — R110 Nausea: Secondary | ICD-10-CM | POA: Diagnosis not present

## 2012-10-10 DIAGNOSIS — R000 Tachycardia, unspecified: Secondary | ICD-10-CM | POA: Diagnosis not present

## 2012-10-13 DIAGNOSIS — Z79899 Other long term (current) drug therapy: Secondary | ICD-10-CM | POA: Diagnosis not present

## 2012-10-14 DIAGNOSIS — R45851 Suicidal ideations: Secondary | ICD-10-CM | POA: Diagnosis not present

## 2012-10-14 DIAGNOSIS — F329 Major depressive disorder, single episode, unspecified: Secondary | ICD-10-CM | POA: Diagnosis not present

## 2012-10-14 DIAGNOSIS — F259 Schizoaffective disorder, unspecified: Secondary | ICD-10-CM | POA: Diagnosis not present

## 2012-10-14 DIAGNOSIS — F319 Bipolar disorder, unspecified: Secondary | ICD-10-CM | POA: Diagnosis not present

## 2012-10-14 DIAGNOSIS — F3289 Other specified depressive episodes: Secondary | ICD-10-CM | POA: Diagnosis not present

## 2012-10-15 DIAGNOSIS — F603 Borderline personality disorder: Secondary | ICD-10-CM | POA: Diagnosis not present

## 2012-10-15 DIAGNOSIS — I10 Essential (primary) hypertension: Secondary | ICD-10-CM | POA: Diagnosis not present

## 2012-10-15 DIAGNOSIS — F329 Major depressive disorder, single episode, unspecified: Secondary | ICD-10-CM | POA: Diagnosis not present

## 2012-10-15 DIAGNOSIS — F607 Dependent personality disorder: Secondary | ICD-10-CM | POA: Diagnosis not present

## 2012-10-15 DIAGNOSIS — F3189 Other bipolar disorder: Secondary | ICD-10-CM | POA: Diagnosis not present

## 2012-10-15 DIAGNOSIS — K219 Gastro-esophageal reflux disease without esophagitis: Secondary | ICD-10-CM | POA: Diagnosis present

## 2012-10-15 DIAGNOSIS — R45851 Suicidal ideations: Secondary | ICD-10-CM | POA: Diagnosis not present

## 2012-10-15 DIAGNOSIS — F259 Schizoaffective disorder, unspecified: Secondary | ICD-10-CM | POA: Diagnosis not present

## 2012-10-15 DIAGNOSIS — F319 Bipolar disorder, unspecified: Secondary | ICD-10-CM | POA: Diagnosis not present

## 2012-10-15 DIAGNOSIS — F3130 Bipolar disorder, current episode depressed, mild or moderate severity, unspecified: Secondary | ICD-10-CM | POA: Diagnosis present

## 2012-10-22 DIAGNOSIS — Z91018 Allergy to other foods: Secondary | ICD-10-CM | POA: Diagnosis not present

## 2012-10-22 DIAGNOSIS — F919 Conduct disorder, unspecified: Secondary | ICD-10-CM | POA: Diagnosis not present

## 2012-10-22 DIAGNOSIS — Z8659 Personal history of other mental and behavioral disorders: Secondary | ICD-10-CM | POA: Diagnosis not present

## 2012-10-22 DIAGNOSIS — Z888 Allergy status to other drugs, medicaments and biological substances status: Secondary | ICD-10-CM | POA: Diagnosis not present

## 2012-11-04 DIAGNOSIS — R109 Unspecified abdominal pain: Secondary | ICD-10-CM | POA: Diagnosis not present

## 2012-11-04 DIAGNOSIS — R1110 Vomiting, unspecified: Secondary | ICD-10-CM | POA: Diagnosis not present

## 2012-11-04 DIAGNOSIS — R110 Nausea: Secondary | ICD-10-CM | POA: Diagnosis not present

## 2012-11-04 DIAGNOSIS — R1031 Right lower quadrant pain: Secondary | ICD-10-CM | POA: Diagnosis not present

## 2012-11-04 DIAGNOSIS — K5900 Constipation, unspecified: Secondary | ICD-10-CM | POA: Diagnosis not present

## 2012-11-04 DIAGNOSIS — E559 Vitamin D deficiency, unspecified: Secondary | ICD-10-CM | POA: Diagnosis not present

## 2012-11-04 DIAGNOSIS — Z79899 Other long term (current) drug therapy: Secondary | ICD-10-CM | POA: Diagnosis not present

## 2012-11-04 DIAGNOSIS — D518 Other vitamin B12 deficiency anemias: Secondary | ICD-10-CM | POA: Diagnosis not present

## 2012-11-04 DIAGNOSIS — F411 Generalized anxiety disorder: Secondary | ICD-10-CM | POA: Diagnosis not present

## 2012-11-04 DIAGNOSIS — R1084 Generalized abdominal pain: Secondary | ICD-10-CM | POA: Diagnosis not present

## 2012-11-04 DIAGNOSIS — F603 Borderline personality disorder: Secondary | ICD-10-CM | POA: Diagnosis not present

## 2012-11-04 DIAGNOSIS — F209 Schizophrenia, unspecified: Secondary | ICD-10-CM | POA: Diagnosis not present

## 2012-11-04 DIAGNOSIS — K219 Gastro-esophageal reflux disease without esophagitis: Secondary | ICD-10-CM | POA: Diagnosis not present

## 2012-11-04 DIAGNOSIS — J45909 Unspecified asthma, uncomplicated: Secondary | ICD-10-CM | POA: Diagnosis not present

## 2012-11-04 DIAGNOSIS — F319 Bipolar disorder, unspecified: Secondary | ICD-10-CM | POA: Diagnosis not present

## 2012-11-04 DIAGNOSIS — R911 Solitary pulmonary nodule: Secondary | ICD-10-CM | POA: Diagnosis not present

## 2012-11-05 DIAGNOSIS — R911 Solitary pulmonary nodule: Secondary | ICD-10-CM | POA: Diagnosis not present

## 2012-11-05 DIAGNOSIS — R109 Unspecified abdominal pain: Secondary | ICD-10-CM | POA: Diagnosis not present

## 2012-11-05 DIAGNOSIS — R4182 Altered mental status, unspecified: Secondary | ICD-10-CM | POA: Diagnosis not present

## 2012-11-05 DIAGNOSIS — N949 Unspecified condition associated with female genital organs and menstrual cycle: Secondary | ICD-10-CM | POA: Diagnosis not present

## 2012-11-05 DIAGNOSIS — F840 Autistic disorder: Secondary | ICD-10-CM | POA: Diagnosis not present

## 2012-11-05 DIAGNOSIS — R1031 Right lower quadrant pain: Secondary | ICD-10-CM | POA: Diagnosis not present

## 2012-11-05 DIAGNOSIS — F319 Bipolar disorder, unspecified: Secondary | ICD-10-CM | POA: Diagnosis not present

## 2012-11-05 DIAGNOSIS — R110 Nausea: Secondary | ICD-10-CM | POA: Diagnosis not present

## 2012-11-05 DIAGNOSIS — K5900 Constipation, unspecified: Secondary | ICD-10-CM | POA: Diagnosis not present

## 2012-11-05 DIAGNOSIS — N83209 Unspecified ovarian cyst, unspecified side: Secondary | ICD-10-CM | POA: Diagnosis not present

## 2012-11-05 DIAGNOSIS — Z79899 Other long term (current) drug therapy: Secondary | ICD-10-CM | POA: Diagnosis not present

## 2012-11-05 DIAGNOSIS — R1110 Vomiting, unspecified: Secondary | ICD-10-CM | POA: Diagnosis not present

## 2012-11-07 DIAGNOSIS — R4182 Altered mental status, unspecified: Secondary | ICD-10-CM | POA: Diagnosis not present

## 2012-11-07 DIAGNOSIS — Z733 Stress, not elsewhere classified: Secondary | ICD-10-CM | POA: Diagnosis not present

## 2012-11-07 DIAGNOSIS — F259 Schizoaffective disorder, unspecified: Secondary | ICD-10-CM | POA: Diagnosis present

## 2012-11-07 DIAGNOSIS — Z79899 Other long term (current) drug therapy: Secondary | ICD-10-CM | POA: Diagnosis not present

## 2012-11-07 DIAGNOSIS — F603 Borderline personality disorder: Secondary | ICD-10-CM | POA: Diagnosis present

## 2012-11-07 DIAGNOSIS — F209 Schizophrenia, unspecified: Secondary | ICD-10-CM | POA: Diagnosis not present

## 2012-11-07 DIAGNOSIS — F489 Nonpsychotic mental disorder, unspecified: Secondary | ICD-10-CM | POA: Diagnosis not present

## 2012-11-07 DIAGNOSIS — J984 Other disorders of lung: Secondary | ICD-10-CM | POA: Diagnosis not present

## 2012-11-07 DIAGNOSIS — I10 Essential (primary) hypertension: Secondary | ICD-10-CM | POA: Diagnosis present

## 2012-11-12 DIAGNOSIS — IMO0002 Reserved for concepts with insufficient information to code with codable children: Secondary | ICD-10-CM | POA: Diagnosis not present

## 2012-11-12 DIAGNOSIS — Z8659 Personal history of other mental and behavioral disorders: Secondary | ICD-10-CM | POA: Diagnosis not present

## 2012-11-12 DIAGNOSIS — R296 Repeated falls: Secondary | ICD-10-CM | POA: Diagnosis not present

## 2012-11-12 DIAGNOSIS — S8990XA Unspecified injury of unspecified lower leg, initial encounter: Secondary | ICD-10-CM | POA: Diagnosis not present

## 2012-11-12 DIAGNOSIS — Z91018 Allergy to other foods: Secondary | ICD-10-CM | POA: Diagnosis not present

## 2012-11-12 DIAGNOSIS — M25569 Pain in unspecified knee: Secondary | ICD-10-CM | POA: Diagnosis not present

## 2012-11-12 DIAGNOSIS — S0990XA Unspecified injury of head, initial encounter: Secondary | ICD-10-CM | POA: Diagnosis not present

## 2012-11-12 DIAGNOSIS — Z888 Allergy status to other drugs, medicaments and biological substances status: Secondary | ICD-10-CM | POA: Diagnosis not present

## 2012-11-12 DIAGNOSIS — S0003XA Contusion of scalp, initial encounter: Secondary | ICD-10-CM | POA: Diagnosis not present

## 2012-11-13 DIAGNOSIS — Z79899 Other long term (current) drug therapy: Secondary | ICD-10-CM | POA: Diagnosis not present

## 2012-11-13 DIAGNOSIS — F209 Schizophrenia, unspecified: Secondary | ICD-10-CM | POA: Diagnosis not present

## 2012-11-13 DIAGNOSIS — F3289 Other specified depressive episodes: Secondary | ICD-10-CM | POA: Diagnosis not present

## 2012-11-13 DIAGNOSIS — F329 Major depressive disorder, single episode, unspecified: Secondary | ICD-10-CM | POA: Diagnosis not present

## 2012-11-13 DIAGNOSIS — R443 Hallucinations, unspecified: Secondary | ICD-10-CM | POA: Diagnosis not present

## 2012-11-13 DIAGNOSIS — R4182 Altered mental status, unspecified: Secondary | ICD-10-CM | POA: Diagnosis not present

## 2012-11-26 DIAGNOSIS — Z23 Encounter for immunization: Secondary | ICD-10-CM | POA: Diagnosis not present

## 2012-11-26 DIAGNOSIS — D518 Other vitamin B12 deficiency anemias: Secondary | ICD-10-CM | POA: Diagnosis not present

## 2012-11-30 DIAGNOSIS — Z79899 Other long term (current) drug therapy: Secondary | ICD-10-CM | POA: Diagnosis not present

## 2012-11-30 DIAGNOSIS — F29 Unspecified psychosis not due to a substance or known physiological condition: Secondary | ICD-10-CM | POA: Diagnosis not present

## 2012-11-30 DIAGNOSIS — IMO0002 Reserved for concepts with insufficient information to code with codable children: Secondary | ICD-10-CM | POA: Diagnosis not present

## 2012-11-30 DIAGNOSIS — F489 Nonpsychotic mental disorder, unspecified: Secondary | ICD-10-CM | POA: Diagnosis not present

## 2012-11-30 DIAGNOSIS — R45851 Suicidal ideations: Secondary | ICD-10-CM | POA: Diagnosis not present

## 2012-11-30 DIAGNOSIS — I10 Essential (primary) hypertension: Secondary | ICD-10-CM | POA: Diagnosis not present

## 2012-11-30 DIAGNOSIS — R443 Hallucinations, unspecified: Secondary | ICD-10-CM | POA: Diagnosis not present

## 2012-11-30 DIAGNOSIS — S81009A Unspecified open wound, unspecified knee, initial encounter: Secondary | ICD-10-CM | POA: Diagnosis not present

## 2012-12-15 DIAGNOSIS — Z79899 Other long term (current) drug therapy: Secondary | ICD-10-CM | POA: Diagnosis not present

## 2012-12-16 DIAGNOSIS — R569 Unspecified convulsions: Secondary | ICD-10-CM | POA: Diagnosis not present

## 2012-12-16 DIAGNOSIS — R259 Unspecified abnormal involuntary movements: Secondary | ICD-10-CM | POA: Diagnosis not present

## 2012-12-16 DIAGNOSIS — K219 Gastro-esophageal reflux disease without esophagitis: Secondary | ICD-10-CM | POA: Diagnosis not present

## 2012-12-16 DIAGNOSIS — J45909 Unspecified asthma, uncomplicated: Secondary | ICD-10-CM | POA: Diagnosis not present

## 2012-12-16 DIAGNOSIS — Z79899 Other long term (current) drug therapy: Secondary | ICD-10-CM | POA: Diagnosis not present

## 2012-12-20 DIAGNOSIS — S8990XA Unspecified injury of unspecified lower leg, initial encounter: Secondary | ICD-10-CM | POA: Diagnosis not present

## 2012-12-20 DIAGNOSIS — L02419 Cutaneous abscess of limb, unspecified: Secondary | ICD-10-CM | POA: Diagnosis not present

## 2012-12-20 DIAGNOSIS — S81009A Unspecified open wound, unspecified knee, initial encounter: Secondary | ICD-10-CM | POA: Diagnosis not present

## 2012-12-24 DIAGNOSIS — D518 Other vitamin B12 deficiency anemias: Secondary | ICD-10-CM | POA: Diagnosis not present

## 2012-12-24 DIAGNOSIS — S81009A Unspecified open wound, unspecified knee, initial encounter: Secondary | ICD-10-CM | POA: Diagnosis not present

## 2013-01-05 DIAGNOSIS — Z4802 Encounter for removal of sutures: Secondary | ICD-10-CM | POA: Diagnosis not present

## 2013-01-07 DIAGNOSIS — Z5181 Encounter for therapeutic drug level monitoring: Secondary | ICD-10-CM | POA: Diagnosis not present

## 2013-01-07 DIAGNOSIS — F209 Schizophrenia, unspecified: Secondary | ICD-10-CM | POA: Diagnosis not present

## 2013-01-07 DIAGNOSIS — R259 Unspecified abnormal involuntary movements: Secondary | ICD-10-CM | POA: Diagnosis not present

## 2013-01-07 DIAGNOSIS — Z79899 Other long term (current) drug therapy: Secondary | ICD-10-CM | POA: Diagnosis not present

## 2013-01-07 DIAGNOSIS — R42 Dizziness and giddiness: Secondary | ICD-10-CM | POA: Diagnosis not present

## 2013-01-07 DIAGNOSIS — R5381 Other malaise: Secondary | ICD-10-CM | POA: Diagnosis not present

## 2013-01-09 DIAGNOSIS — F319 Bipolar disorder, unspecified: Secondary | ICD-10-CM | POA: Diagnosis not present

## 2013-01-09 DIAGNOSIS — F209 Schizophrenia, unspecified: Secondary | ICD-10-CM | POA: Diagnosis not present

## 2013-01-09 DIAGNOSIS — F411 Generalized anxiety disorder: Secondary | ICD-10-CM | POA: Diagnosis not present

## 2013-01-09 DIAGNOSIS — F339 Major depressive disorder, recurrent, unspecified: Secondary | ICD-10-CM | POA: Diagnosis not present

## 2013-01-09 DIAGNOSIS — F29 Unspecified psychosis not due to a substance or known physiological condition: Secondary | ICD-10-CM | POA: Diagnosis not present

## 2013-01-09 DIAGNOSIS — F329 Major depressive disorder, single episode, unspecified: Secondary | ICD-10-CM | POA: Diagnosis not present

## 2013-01-09 DIAGNOSIS — Z79899 Other long term (current) drug therapy: Secondary | ICD-10-CM | POA: Diagnosis not present

## 2013-01-12 DIAGNOSIS — R4182 Altered mental status, unspecified: Secondary | ICD-10-CM | POA: Diagnosis not present

## 2013-01-12 DIAGNOSIS — R079 Chest pain, unspecified: Secondary | ICD-10-CM | POA: Diagnosis not present

## 2013-01-12 DIAGNOSIS — R071 Chest pain on breathing: Secondary | ICD-10-CM | POA: Diagnosis not present

## 2013-01-13 DIAGNOSIS — Z79899 Other long term (current) drug therapy: Secondary | ICD-10-CM | POA: Diagnosis not present

## 2013-01-13 DIAGNOSIS — Z885 Allergy status to narcotic agent status: Secondary | ICD-10-CM | POA: Diagnosis not present

## 2013-01-13 DIAGNOSIS — Z882 Allergy status to sulfonamides status: Secondary | ICD-10-CM | POA: Diagnosis not present

## 2013-01-13 DIAGNOSIS — F209 Schizophrenia, unspecified: Secondary | ICD-10-CM | POA: Diagnosis not present

## 2013-01-13 DIAGNOSIS — IMO0002 Reserved for concepts with insufficient information to code with codable children: Secondary | ICD-10-CM | POA: Diagnosis not present

## 2013-01-13 DIAGNOSIS — F29 Unspecified psychosis not due to a substance or known physiological condition: Secondary | ICD-10-CM | POA: Diagnosis not present

## 2013-01-13 DIAGNOSIS — Z888 Allergy status to other drugs, medicaments and biological substances status: Secondary | ICD-10-CM | POA: Diagnosis not present

## 2013-01-13 DIAGNOSIS — F919 Conduct disorder, unspecified: Secondary | ICD-10-CM | POA: Diagnosis not present

## 2013-01-30 DIAGNOSIS — Z79899 Other long term (current) drug therapy: Secondary | ICD-10-CM | POA: Diagnosis not present

## 2013-02-09 DIAGNOSIS — R071 Chest pain on breathing: Secondary | ICD-10-CM | POA: Diagnosis not present

## 2013-02-09 DIAGNOSIS — R079 Chest pain, unspecified: Secondary | ICD-10-CM | POA: Diagnosis not present

## 2013-02-13 DIAGNOSIS — F341 Dysthymic disorder: Secondary | ICD-10-CM | POA: Diagnosis not present

## 2013-02-13 DIAGNOSIS — R404 Transient alteration of awareness: Secondary | ICD-10-CM | POA: Diagnosis not present

## 2013-02-13 DIAGNOSIS — F209 Schizophrenia, unspecified: Secondary | ICD-10-CM | POA: Diagnosis not present

## 2013-02-13 DIAGNOSIS — F919 Conduct disorder, unspecified: Secondary | ICD-10-CM | POA: Diagnosis not present

## 2013-02-13 DIAGNOSIS — D518 Other vitamin B12 deficiency anemias: Secondary | ICD-10-CM | POA: Diagnosis not present

## 2013-02-13 DIAGNOSIS — F603 Borderline personality disorder: Secondary | ICD-10-CM | POA: Diagnosis not present

## 2013-02-13 DIAGNOSIS — K219 Gastro-esophageal reflux disease without esophagitis: Secondary | ICD-10-CM | POA: Diagnosis not present

## 2013-02-23 DIAGNOSIS — R0989 Other specified symptoms and signs involving the circulatory and respiratory systems: Secondary | ICD-10-CM | POA: Diagnosis not present

## 2013-02-23 DIAGNOSIS — R0602 Shortness of breath: Secondary | ICD-10-CM | POA: Diagnosis not present

## 2013-02-23 DIAGNOSIS — R0609 Other forms of dyspnea: Secondary | ICD-10-CM | POA: Diagnosis not present

## 2013-02-23 DIAGNOSIS — Z79899 Other long term (current) drug therapy: Secondary | ICD-10-CM | POA: Diagnosis not present

## 2013-02-23 DIAGNOSIS — K219 Gastro-esophageal reflux disease without esophagitis: Secondary | ICD-10-CM | POA: Diagnosis not present

## 2013-02-23 DIAGNOSIS — R079 Chest pain, unspecified: Secondary | ICD-10-CM | POA: Diagnosis not present

## 2013-02-23 DIAGNOSIS — J45909 Unspecified asthma, uncomplicated: Secondary | ICD-10-CM | POA: Diagnosis not present

## 2013-02-23 DIAGNOSIS — R071 Chest pain on breathing: Secondary | ICD-10-CM | POA: Diagnosis not present

## 2013-02-25 DIAGNOSIS — K922 Gastrointestinal hemorrhage, unspecified: Secondary | ICD-10-CM | POA: Diagnosis not present

## 2013-02-25 DIAGNOSIS — Z881 Allergy status to other antibiotic agents status: Secondary | ICD-10-CM | POA: Diagnosis not present

## 2013-02-25 DIAGNOSIS — K602 Anal fissure, unspecified: Secondary | ICD-10-CM | POA: Diagnosis not present

## 2013-02-25 DIAGNOSIS — R110 Nausea: Secondary | ICD-10-CM | POA: Diagnosis not present

## 2013-03-04 DIAGNOSIS — Z79899 Other long term (current) drug therapy: Secondary | ICD-10-CM | POA: Diagnosis not present

## 2013-03-13 DIAGNOSIS — S93409A Sprain of unspecified ligament of unspecified ankle, initial encounter: Secondary | ICD-10-CM | POA: Diagnosis not present

## 2013-03-13 DIAGNOSIS — Z8659 Personal history of other mental and behavioral disorders: Secondary | ICD-10-CM | POA: Diagnosis not present

## 2013-03-13 DIAGNOSIS — X58XXXA Exposure to other specified factors, initial encounter: Secondary | ICD-10-CM | POA: Diagnosis not present

## 2013-03-13 DIAGNOSIS — M25579 Pain in unspecified ankle and joints of unspecified foot: Secondary | ICD-10-CM | POA: Diagnosis not present

## 2013-03-13 DIAGNOSIS — Z888 Allergy status to other drugs, medicaments and biological substances status: Secondary | ICD-10-CM | POA: Diagnosis not present

## 2013-03-17 DIAGNOSIS — H6690 Otitis media, unspecified, unspecified ear: Secondary | ICD-10-CM | POA: Diagnosis not present

## 2013-03-17 DIAGNOSIS — J069 Acute upper respiratory infection, unspecified: Secondary | ICD-10-CM | POA: Diagnosis not present

## 2013-03-17 DIAGNOSIS — Z8659 Personal history of other mental and behavioral disorders: Secondary | ICD-10-CM | POA: Diagnosis not present

## 2013-03-17 DIAGNOSIS — Z91018 Allergy to other foods: Secondary | ICD-10-CM | POA: Diagnosis not present

## 2013-03-17 DIAGNOSIS — Z888 Allergy status to other drugs, medicaments and biological substances status: Secondary | ICD-10-CM | POA: Diagnosis not present

## 2013-03-18 DIAGNOSIS — R05 Cough: Secondary | ICD-10-CM | POA: Diagnosis not present

## 2013-03-18 DIAGNOSIS — R0609 Other forms of dyspnea: Secondary | ICD-10-CM | POA: Diagnosis not present

## 2013-03-18 DIAGNOSIS — Z8659 Personal history of other mental and behavioral disorders: Secondary | ICD-10-CM | POA: Diagnosis not present

## 2013-03-18 DIAGNOSIS — J069 Acute upper respiratory infection, unspecified: Secondary | ICD-10-CM | POA: Diagnosis not present

## 2013-03-18 DIAGNOSIS — Z888 Allergy status to other drugs, medicaments and biological substances status: Secondary | ICD-10-CM | POA: Diagnosis not present

## 2013-03-18 DIAGNOSIS — H6690 Otitis media, unspecified, unspecified ear: Secondary | ICD-10-CM | POA: Diagnosis not present

## 2013-03-18 DIAGNOSIS — R059 Cough, unspecified: Secondary | ICD-10-CM | POA: Diagnosis not present

## 2013-03-27 DIAGNOSIS — Z888 Allergy status to other drugs, medicaments and biological substances status: Secondary | ICD-10-CM | POA: Diagnosis not present

## 2013-03-27 DIAGNOSIS — S6390XA Sprain of unspecified part of unspecified wrist and hand, initial encounter: Secondary | ICD-10-CM | POA: Diagnosis not present

## 2013-03-27 DIAGNOSIS — X58XXXA Exposure to other specified factors, initial encounter: Secondary | ICD-10-CM | POA: Diagnosis not present

## 2013-03-27 DIAGNOSIS — Z8659 Personal history of other mental and behavioral disorders: Secondary | ICD-10-CM | POA: Diagnosis not present

## 2013-03-27 DIAGNOSIS — Z91018 Allergy to other foods: Secondary | ICD-10-CM | POA: Diagnosis not present

## 2013-04-07 DIAGNOSIS — R443 Hallucinations, unspecified: Secondary | ICD-10-CM | POA: Diagnosis not present

## 2013-04-07 DIAGNOSIS — F200 Paranoid schizophrenia: Secondary | ICD-10-CM | POA: Diagnosis not present

## 2013-04-07 DIAGNOSIS — H6690 Otitis media, unspecified, unspecified ear: Secondary | ICD-10-CM | POA: Diagnosis not present

## 2013-04-07 DIAGNOSIS — R5381 Other malaise: Secondary | ICD-10-CM | POA: Diagnosis not present

## 2013-04-07 DIAGNOSIS — F489 Nonpsychotic mental disorder, unspecified: Secondary | ICD-10-CM | POA: Diagnosis not present

## 2013-04-07 DIAGNOSIS — R45850 Homicidal ideations: Secondary | ICD-10-CM | POA: Diagnosis not present

## 2013-04-07 DIAGNOSIS — R42 Dizziness and giddiness: Secondary | ICD-10-CM | POA: Diagnosis not present

## 2013-04-08 DIAGNOSIS — R443 Hallucinations, unspecified: Secondary | ICD-10-CM | POA: Diagnosis not present

## 2013-04-08 DIAGNOSIS — R4182 Altered mental status, unspecified: Secondary | ICD-10-CM | POA: Diagnosis not present

## 2013-04-08 DIAGNOSIS — R45850 Homicidal ideations: Secondary | ICD-10-CM | POA: Diagnosis not present

## 2013-04-08 DIAGNOSIS — F200 Paranoid schizophrenia: Secondary | ICD-10-CM | POA: Diagnosis not present

## 2013-04-08 DIAGNOSIS — F489 Nonpsychotic mental disorder, unspecified: Secondary | ICD-10-CM | POA: Diagnosis not present

## 2013-04-09 DIAGNOSIS — F200 Paranoid schizophrenia: Secondary | ICD-10-CM | POA: Diagnosis present

## 2013-04-09 DIAGNOSIS — F209 Schizophrenia, unspecified: Secondary | ICD-10-CM | POA: Diagnosis not present

## 2013-04-09 DIAGNOSIS — Z733 Stress, not elsewhere classified: Secondary | ICD-10-CM | POA: Diagnosis not present

## 2013-04-14 DIAGNOSIS — R1011 Right upper quadrant pain: Secondary | ICD-10-CM | POA: Diagnosis not present

## 2013-04-14 DIAGNOSIS — S93409A Sprain of unspecified ligament of unspecified ankle, initial encounter: Secondary | ICD-10-CM | POA: Diagnosis not present

## 2013-04-14 DIAGNOSIS — F259 Schizoaffective disorder, unspecified: Secondary | ICD-10-CM | POA: Diagnosis not present

## 2013-04-14 DIAGNOSIS — D518 Other vitamin B12 deficiency anemias: Secondary | ICD-10-CM | POA: Diagnosis not present

## 2013-04-17 DIAGNOSIS — J309 Allergic rhinitis, unspecified: Secondary | ICD-10-CM | POA: Diagnosis not present

## 2013-04-17 DIAGNOSIS — J45909 Unspecified asthma, uncomplicated: Secondary | ICD-10-CM | POA: Diagnosis not present

## 2013-04-17 DIAGNOSIS — K219 Gastro-esophageal reflux disease without esophagitis: Secondary | ICD-10-CM | POA: Diagnosis not present

## 2013-04-18 DIAGNOSIS — R51 Headache: Secondary | ICD-10-CM | POA: Diagnosis not present

## 2013-04-18 DIAGNOSIS — R079 Chest pain, unspecified: Secondary | ICD-10-CM | POA: Diagnosis not present

## 2013-04-18 DIAGNOSIS — R5381 Other malaise: Secondary | ICD-10-CM | POA: Diagnosis not present

## 2013-04-18 DIAGNOSIS — R0602 Shortness of breath: Secondary | ICD-10-CM | POA: Diagnosis not present

## 2013-04-20 DIAGNOSIS — Z8659 Personal history of other mental and behavioral disorders: Secondary | ICD-10-CM | POA: Diagnosis not present

## 2013-04-20 DIAGNOSIS — R1011 Right upper quadrant pain: Secondary | ICD-10-CM | POA: Diagnosis not present

## 2013-04-20 DIAGNOSIS — K219 Gastro-esophageal reflux disease without esophagitis: Secondary | ICD-10-CM | POA: Diagnosis not present

## 2013-04-20 DIAGNOSIS — Z91018 Allergy to other foods: Secondary | ICD-10-CM | POA: Diagnosis not present

## 2013-04-20 DIAGNOSIS — D649 Anemia, unspecified: Secondary | ICD-10-CM | POA: Diagnosis not present

## 2013-04-20 DIAGNOSIS — Z888 Allergy status to other drugs, medicaments and biological substances status: Secondary | ICD-10-CM | POA: Diagnosis not present

## 2013-04-20 DIAGNOSIS — R0789 Other chest pain: Secondary | ICD-10-CM | POA: Diagnosis not present

## 2013-04-22 DIAGNOSIS — Z888 Allergy status to other drugs, medicaments and biological substances status: Secondary | ICD-10-CM | POA: Diagnosis not present

## 2013-04-22 DIAGNOSIS — D649 Anemia, unspecified: Secondary | ICD-10-CM | POA: Diagnosis not present

## 2013-04-22 DIAGNOSIS — K219 Gastro-esophageal reflux disease without esophagitis: Secondary | ICD-10-CM | POA: Diagnosis not present

## 2013-04-22 DIAGNOSIS — F4323 Adjustment disorder with mixed anxiety and depressed mood: Secondary | ICD-10-CM | POA: Diagnosis not present

## 2013-04-22 DIAGNOSIS — R1011 Right upper quadrant pain: Secondary | ICD-10-CM | POA: Diagnosis not present

## 2013-04-22 DIAGNOSIS — R000 Tachycardia, unspecified: Secondary | ICD-10-CM | POA: Diagnosis not present

## 2013-04-22 DIAGNOSIS — F209 Schizophrenia, unspecified: Secondary | ICD-10-CM | POA: Diagnosis not present

## 2013-04-22 DIAGNOSIS — R109 Unspecified abdominal pain: Secondary | ICD-10-CM | POA: Diagnosis not present

## 2013-04-22 DIAGNOSIS — R4182 Altered mental status, unspecified: Secondary | ICD-10-CM | POA: Diagnosis not present

## 2013-04-28 DIAGNOSIS — F209 Schizophrenia, unspecified: Secondary | ICD-10-CM | POA: Diagnosis not present

## 2013-04-28 DIAGNOSIS — F0280 Dementia in other diseases classified elsewhere without behavioral disturbance: Secondary | ICD-10-CM | POA: Diagnosis not present

## 2013-04-28 DIAGNOSIS — F29 Unspecified psychosis not due to a substance or known physiological condition: Secondary | ICD-10-CM | POA: Diagnosis not present

## 2013-04-28 DIAGNOSIS — J45909 Unspecified asthma, uncomplicated: Secondary | ICD-10-CM | POA: Diagnosis not present

## 2013-04-28 DIAGNOSIS — F3289 Other specified depressive episodes: Secondary | ICD-10-CM | POA: Diagnosis not present

## 2013-04-28 DIAGNOSIS — F329 Major depressive disorder, single episode, unspecified: Secondary | ICD-10-CM | POA: Diagnosis not present

## 2013-04-28 DIAGNOSIS — F603 Borderline personality disorder: Secondary | ICD-10-CM | POA: Diagnosis not present

## 2013-04-28 DIAGNOSIS — Z79899 Other long term (current) drug therapy: Secondary | ICD-10-CM | POA: Diagnosis not present

## 2013-04-28 DIAGNOSIS — K219 Gastro-esophageal reflux disease without esophagitis: Secondary | ICD-10-CM | POA: Diagnosis not present

## 2013-05-03 DIAGNOSIS — F918 Other conduct disorders: Secondary | ICD-10-CM | POA: Diagnosis not present

## 2013-05-03 DIAGNOSIS — R4182 Altered mental status, unspecified: Secondary | ICD-10-CM | POA: Diagnosis not present

## 2013-05-03 DIAGNOSIS — J45909 Unspecified asthma, uncomplicated: Secondary | ICD-10-CM | POA: Diagnosis not present

## 2013-05-03 DIAGNOSIS — F29 Unspecified psychosis not due to a substance or known physiological condition: Secondary | ICD-10-CM | POA: Diagnosis not present

## 2013-05-03 DIAGNOSIS — K219 Gastro-esophageal reflux disease without esophagitis: Secondary | ICD-10-CM | POA: Diagnosis not present

## 2013-05-03 DIAGNOSIS — F329 Major depressive disorder, single episode, unspecified: Secondary | ICD-10-CM | POA: Diagnosis not present

## 2013-05-03 DIAGNOSIS — F603 Borderline personality disorder: Secondary | ICD-10-CM | POA: Diagnosis not present

## 2013-05-03 DIAGNOSIS — Z79899 Other long term (current) drug therapy: Secondary | ICD-10-CM | POA: Diagnosis not present

## 2013-05-03 DIAGNOSIS — Z008 Encounter for other general examination: Secondary | ICD-10-CM | POA: Diagnosis not present

## 2013-05-04 DIAGNOSIS — I498 Other specified cardiac arrhythmias: Secondary | ICD-10-CM | POA: Diagnosis not present

## 2013-05-04 DIAGNOSIS — F259 Schizoaffective disorder, unspecified: Secondary | ICD-10-CM | POA: Diagnosis not present

## 2013-05-04 DIAGNOSIS — F319 Bipolar disorder, unspecified: Secondary | ICD-10-CM | POA: Diagnosis present

## 2013-05-04 DIAGNOSIS — R079 Chest pain, unspecified: Secondary | ICD-10-CM | POA: Diagnosis not present

## 2013-05-04 DIAGNOSIS — R0602 Shortness of breath: Secondary | ICD-10-CM | POA: Diagnosis not present

## 2013-05-04 DIAGNOSIS — H9209 Otalgia, unspecified ear: Secondary | ICD-10-CM | POA: Diagnosis not present

## 2013-05-04 DIAGNOSIS — F39 Unspecified mood [affective] disorder: Secondary | ICD-10-CM | POA: Diagnosis not present

## 2013-05-04 DIAGNOSIS — R918 Other nonspecific abnormal finding of lung field: Secondary | ICD-10-CM | POA: Diagnosis not present

## 2013-05-04 DIAGNOSIS — F840 Autistic disorder: Secondary | ICD-10-CM | POA: Diagnosis not present

## 2013-05-04 DIAGNOSIS — F329 Major depressive disorder, single episode, unspecified: Secondary | ICD-10-CM | POA: Diagnosis not present

## 2013-05-04 DIAGNOSIS — D649 Anemia, unspecified: Secondary | ICD-10-CM | POA: Diagnosis present

## 2013-05-04 DIAGNOSIS — Z79899 Other long term (current) drug therapy: Secondary | ICD-10-CM | POA: Diagnosis not present

## 2013-05-04 DIAGNOSIS — F3289 Other specified depressive episodes: Secondary | ICD-10-CM | POA: Diagnosis not present

## 2013-05-04 DIAGNOSIS — F17200 Nicotine dependence, unspecified, uncomplicated: Secondary | ICD-10-CM | POA: Diagnosis not present

## 2013-05-04 DIAGNOSIS — R0789 Other chest pain: Secondary | ICD-10-CM | POA: Diagnosis not present

## 2013-05-04 DIAGNOSIS — J45909 Unspecified asthma, uncomplicated: Secondary | ICD-10-CM | POA: Diagnosis not present

## 2013-05-04 DIAGNOSIS — Z658 Other specified problems related to psychosocial circumstances: Secondary | ICD-10-CM | POA: Diagnosis not present

## 2013-05-04 DIAGNOSIS — F607 Dependent personality disorder: Secondary | ICD-10-CM | POA: Diagnosis not present

## 2013-05-04 DIAGNOSIS — F3189 Other bipolar disorder: Secondary | ICD-10-CM | POA: Diagnosis not present

## 2013-05-04 DIAGNOSIS — R911 Solitary pulmonary nodule: Secondary | ICD-10-CM | POA: Diagnosis not present

## 2013-05-04 DIAGNOSIS — Z609 Problem related to social environment, unspecified: Secondary | ICD-10-CM | POA: Diagnosis not present

## 2013-05-04 DIAGNOSIS — G250 Essential tremor: Secondary | ICD-10-CM | POA: Diagnosis not present

## 2013-05-04 DIAGNOSIS — F29 Unspecified psychosis not due to a substance or known physiological condition: Secondary | ICD-10-CM | POA: Diagnosis not present

## 2013-05-04 DIAGNOSIS — I10 Essential (primary) hypertension: Secondary | ICD-10-CM | POA: Diagnosis present

## 2013-05-04 DIAGNOSIS — K219 Gastro-esophageal reflux disease without esophagitis: Secondary | ICD-10-CM | POA: Diagnosis not present

## 2013-05-12 DIAGNOSIS — I498 Other specified cardiac arrhythmias: Secondary | ICD-10-CM | POA: Diagnosis not present

## 2013-05-12 DIAGNOSIS — H9209 Otalgia, unspecified ear: Secondary | ICD-10-CM | POA: Diagnosis not present

## 2013-05-12 DIAGNOSIS — R0789 Other chest pain: Secondary | ICD-10-CM | POA: Diagnosis not present

## 2013-05-12 DIAGNOSIS — R0602 Shortness of breath: Secondary | ICD-10-CM | POA: Diagnosis not present

## 2013-05-12 DIAGNOSIS — R911 Solitary pulmonary nodule: Secondary | ICD-10-CM | POA: Diagnosis not present

## 2013-05-12 DIAGNOSIS — R918 Other nonspecific abnormal finding of lung field: Secondary | ICD-10-CM | POA: Diagnosis not present

## 2013-05-12 DIAGNOSIS — I10 Essential (primary) hypertension: Secondary | ICD-10-CM | POA: Diagnosis not present

## 2013-05-12 DIAGNOSIS — R079 Chest pain, unspecified: Secondary | ICD-10-CM | POA: Diagnosis not present

## 2013-05-18 DIAGNOSIS — K219 Gastro-esophageal reflux disease without esophagitis: Secondary | ICD-10-CM | POA: Diagnosis not present

## 2013-05-18 DIAGNOSIS — M6281 Muscle weakness (generalized): Secondary | ICD-10-CM | POA: Diagnosis not present

## 2013-05-18 DIAGNOSIS — F259 Schizoaffective disorder, unspecified: Secondary | ICD-10-CM | POA: Diagnosis not present

## 2013-05-18 DIAGNOSIS — F319 Bipolar disorder, unspecified: Secondary | ICD-10-CM | POA: Diagnosis not present

## 2013-05-18 DIAGNOSIS — R45851 Suicidal ideations: Secondary | ICD-10-CM | POA: Diagnosis not present

## 2013-05-19 DIAGNOSIS — F259 Schizoaffective disorder, unspecified: Secondary | ICD-10-CM | POA: Diagnosis not present

## 2013-05-20 DIAGNOSIS — F259 Schizoaffective disorder, unspecified: Secondary | ICD-10-CM | POA: Diagnosis not present

## 2013-05-21 DIAGNOSIS — F259 Schizoaffective disorder, unspecified: Secondary | ICD-10-CM | POA: Diagnosis not present

## 2013-05-22 DIAGNOSIS — F259 Schizoaffective disorder, unspecified: Secondary | ICD-10-CM | POA: Diagnosis not present

## 2013-05-23 DIAGNOSIS — F259 Schizoaffective disorder, unspecified: Secondary | ICD-10-CM | POA: Diagnosis not present

## 2013-05-24 DIAGNOSIS — F259 Schizoaffective disorder, unspecified: Secondary | ICD-10-CM | POA: Diagnosis not present

## 2013-05-25 DIAGNOSIS — F259 Schizoaffective disorder, unspecified: Secondary | ICD-10-CM | POA: Diagnosis not present

## 2013-05-26 DIAGNOSIS — F259 Schizoaffective disorder, unspecified: Secondary | ICD-10-CM | POA: Diagnosis not present

## 2013-05-27 DIAGNOSIS — F259 Schizoaffective disorder, unspecified: Secondary | ICD-10-CM | POA: Diagnosis not present

## 2013-05-30 DIAGNOSIS — F259 Schizoaffective disorder, unspecified: Secondary | ICD-10-CM | POA: Diagnosis not present

## 2013-05-30 DIAGNOSIS — I10 Essential (primary) hypertension: Secondary | ICD-10-CM | POA: Diagnosis not present

## 2013-05-30 DIAGNOSIS — Z79899 Other long term (current) drug therapy: Secondary | ICD-10-CM | POA: Diagnosis not present

## 2013-05-30 DIAGNOSIS — F319 Bipolar disorder, unspecified: Secondary | ICD-10-CM | POA: Diagnosis not present

## 2013-05-30 DIAGNOSIS — R079 Chest pain, unspecified: Secondary | ICD-10-CM | POA: Diagnosis not present

## 2013-05-30 DIAGNOSIS — Z87891 Personal history of nicotine dependence: Secondary | ICD-10-CM | POA: Diagnosis not present

## 2013-05-30 DIAGNOSIS — K219 Gastro-esophageal reflux disease without esophagitis: Secondary | ICD-10-CM | POA: Diagnosis not present

## 2013-06-01 DIAGNOSIS — R1011 Right upper quadrant pain: Secondary | ICD-10-CM | POA: Diagnosis not present

## 2013-06-01 DIAGNOSIS — Z79899 Other long term (current) drug therapy: Secondary | ICD-10-CM | POA: Diagnosis not present

## 2013-06-01 DIAGNOSIS — R110 Nausea: Secondary | ICD-10-CM | POA: Diagnosis not present

## 2013-06-01 DIAGNOSIS — Z5181 Encounter for therapeutic drug level monitoring: Secondary | ICD-10-CM | POA: Diagnosis not present

## 2013-06-03 DIAGNOSIS — R21 Rash and other nonspecific skin eruption: Secondary | ICD-10-CM | POA: Diagnosis not present

## 2013-06-03 DIAGNOSIS — L0291 Cutaneous abscess, unspecified: Secondary | ICD-10-CM | POA: Diagnosis not present

## 2013-06-03 DIAGNOSIS — L0390 Cellulitis, unspecified: Secondary | ICD-10-CM | POA: Diagnosis not present

## 2013-06-04 DIAGNOSIS — G8929 Other chronic pain: Secondary | ICD-10-CM | POA: Diagnosis not present

## 2013-06-04 DIAGNOSIS — R52 Pain, unspecified: Secondary | ICD-10-CM | POA: Diagnosis not present

## 2013-06-04 DIAGNOSIS — R071 Chest pain on breathing: Secondary | ICD-10-CM | POA: Diagnosis not present

## 2013-06-04 DIAGNOSIS — R079 Chest pain, unspecified: Secondary | ICD-10-CM | POA: Diagnosis not present

## 2013-06-04 DIAGNOSIS — M542 Cervicalgia: Secondary | ICD-10-CM | POA: Diagnosis not present

## 2013-06-04 DIAGNOSIS — M545 Low back pain, unspecified: Secondary | ICD-10-CM | POA: Diagnosis not present

## 2013-06-04 DIAGNOSIS — S129XXA Fracture of neck, unspecified, initial encounter: Secondary | ICD-10-CM | POA: Diagnosis not present

## 2013-06-04 DIAGNOSIS — R9431 Abnormal electrocardiogram [ECG] [EKG]: Secondary | ICD-10-CM | POA: Diagnosis not present

## 2013-06-05 DIAGNOSIS — M545 Low back pain, unspecified: Secondary | ICD-10-CM | POA: Diagnosis not present

## 2013-06-05 DIAGNOSIS — S129XXA Fracture of neck, unspecified, initial encounter: Secondary | ICD-10-CM | POA: Diagnosis not present

## 2013-06-05 DIAGNOSIS — R071 Chest pain on breathing: Secondary | ICD-10-CM | POA: Diagnosis not present

## 2013-06-08 DIAGNOSIS — R110 Nausea: Secondary | ICD-10-CM | POA: Diagnosis not present

## 2013-06-08 DIAGNOSIS — R42 Dizziness and giddiness: Secondary | ICD-10-CM | POA: Diagnosis not present

## 2013-06-08 DIAGNOSIS — R112 Nausea with vomiting, unspecified: Secondary | ICD-10-CM | POA: Diagnosis not present

## 2013-06-08 DIAGNOSIS — I10 Essential (primary) hypertension: Secondary | ICD-10-CM | POA: Diagnosis not present

## 2013-06-08 DIAGNOSIS — F259 Schizoaffective disorder, unspecified: Secondary | ICD-10-CM | POA: Diagnosis not present

## 2013-06-08 DIAGNOSIS — R0789 Other chest pain: Secondary | ICD-10-CM | POA: Diagnosis not present

## 2013-06-08 DIAGNOSIS — K219 Gastro-esophageal reflux disease without esophagitis: Secondary | ICD-10-CM | POA: Diagnosis not present

## 2013-06-08 DIAGNOSIS — Z87891 Personal history of nicotine dependence: Secondary | ICD-10-CM | POA: Diagnosis not present

## 2013-06-08 DIAGNOSIS — R079 Chest pain, unspecified: Secondary | ICD-10-CM | POA: Diagnosis not present

## 2013-06-08 DIAGNOSIS — F319 Bipolar disorder, unspecified: Secondary | ICD-10-CM | POA: Diagnosis not present

## 2013-06-11 DIAGNOSIS — IMO0002 Reserved for concepts with insufficient information to code with codable children: Secondary | ICD-10-CM | POA: Diagnosis not present

## 2013-06-11 DIAGNOSIS — K6289 Other specified diseases of anus and rectum: Secondary | ICD-10-CM | POA: Diagnosis not present

## 2013-06-11 DIAGNOSIS — T7421XA Adult sexual abuse, confirmed, initial encounter: Secondary | ICD-10-CM | POA: Diagnosis not present

## 2013-06-13 DIAGNOSIS — R45850 Homicidal ideations: Secondary | ICD-10-CM | POA: Diagnosis not present

## 2013-06-13 DIAGNOSIS — R110 Nausea: Secondary | ICD-10-CM | POA: Diagnosis not present

## 2013-06-13 DIAGNOSIS — R45851 Suicidal ideations: Secondary | ICD-10-CM | POA: Diagnosis not present

## 2013-06-13 DIAGNOSIS — F489 Nonpsychotic mental disorder, unspecified: Secondary | ICD-10-CM | POA: Diagnosis not present

## 2013-06-15 DIAGNOSIS — F840 Autistic disorder: Secondary | ICD-10-CM | POA: Insufficient documentation

## 2013-06-15 DIAGNOSIS — F845 Asperger's syndrome: Secondary | ICD-10-CM | POA: Insufficient documentation

## 2013-06-18 DIAGNOSIS — F259 Schizoaffective disorder, unspecified: Secondary | ICD-10-CM | POA: Diagnosis not present

## 2013-06-18 DIAGNOSIS — R45851 Suicidal ideations: Secondary | ICD-10-CM | POA: Diagnosis not present

## 2013-06-18 DIAGNOSIS — Z5987 Material hardship: Secondary | ICD-10-CM | POA: Diagnosis not present

## 2013-06-18 DIAGNOSIS — K5900 Constipation, unspecified: Secondary | ICD-10-CM | POA: Diagnosis not present

## 2013-06-18 DIAGNOSIS — Z638 Other specified problems related to primary support group: Secondary | ICD-10-CM | POA: Diagnosis not present

## 2013-06-18 DIAGNOSIS — Q992 Fragile X chromosome: Secondary | ICD-10-CM | POA: Diagnosis not present

## 2013-06-18 DIAGNOSIS — Z5900 Homelessness unspecified: Secondary | ICD-10-CM | POA: Diagnosis not present

## 2013-06-18 DIAGNOSIS — Z733 Stress, not elsewhere classified: Secondary | ICD-10-CM | POA: Diagnosis not present

## 2013-06-18 DIAGNOSIS — Z598 Other problems related to housing and economic circumstances: Secondary | ICD-10-CM | POA: Diagnosis not present

## 2013-08-02 ENCOUNTER — Encounter (HOSPITAL_COMMUNITY): Payer: Self-pay | Admitting: Emergency Medicine

## 2013-08-02 ENCOUNTER — Emergency Department (HOSPITAL_COMMUNITY)
Admission: EM | Admit: 2013-08-02 | Discharge: 2013-08-02 | Disposition: A | Discharge status: Home / Self Care | Payer: Medicare Other | Attending: Emergency Medicine | Admitting: Emergency Medicine

## 2013-08-02 DIAGNOSIS — J45909 Unspecified asthma, uncomplicated: Secondary | ICD-10-CM | POA: Insufficient documentation

## 2013-08-02 DIAGNOSIS — F259 Schizoaffective disorder, unspecified: Secondary | ICD-10-CM | POA: Diagnosis not present

## 2013-08-02 DIAGNOSIS — F3289 Other specified depressive episodes: Secondary | ICD-10-CM | POA: Insufficient documentation

## 2013-08-02 DIAGNOSIS — R064 Hyperventilation: Secondary | ICD-10-CM | POA: Diagnosis not present

## 2013-08-02 DIAGNOSIS — Z87891 Personal history of nicotine dependence: Secondary | ICD-10-CM | POA: Insufficient documentation

## 2013-08-02 DIAGNOSIS — F411 Generalized anxiety disorder: Secondary | ICD-10-CM | POA: Diagnosis not present

## 2013-08-02 DIAGNOSIS — Z79899 Other long term (current) drug therapy: Secondary | ICD-10-CM | POA: Insufficient documentation

## 2013-08-02 DIAGNOSIS — F329 Major depressive disorder, single episode, unspecified: Secondary | ICD-10-CM | POA: Insufficient documentation

## 2013-08-02 DIAGNOSIS — Z3202 Encounter for pregnancy test, result negative: Secondary | ICD-10-CM | POA: Diagnosis not present

## 2013-08-02 HISTORY — DX: Major depressive disorder, single episode, unspecified: F32.9

## 2013-08-02 HISTORY — DX: Depression, unspecified: F32.A

## 2013-08-02 HISTORY — DX: Unspecified asthma, uncomplicated: J45.909

## 2013-08-02 HISTORY — DX: Schizoaffective disorder, bipolar type: F25.0

## 2013-08-02 LAB — PREGNANCY, URINE: Preg Test, Ur: NEGATIVE

## 2013-08-02 MED ORDER — LORAZEPAM 1 MG PO TABS
1.0000 mg | ORAL_TABLET | Freq: Once | ORAL | Status: AC
  Administered 2013-08-02: 1 mg via ORAL
  Filled 2013-08-02: qty 1

## 2013-08-02 NOTE — ED Provider Notes (Signed)
CSN: [id number]     Arrival date & time 08/02/13  1511 History   First MD Initiated Contact with Patient 08/02/13 1512     Chief Complaint  Patient presents with  . Hyperventilating     (Consider location/radiation/quality/duration/timing/severity/associated sxs/prior Treatment) HPI  23 year old female brought in by EMS for evaluation of shortness of breath. Acute onset shortly before arrival. Patient states that she had a repeated while walking and then got into a hot and which further exacerbated things. She denies any acute pain anywhere. No fevers or chills. Was in her usual state of health prior to her walk. No history of asthma. Nonsmoker. Denies any drug use. No unusual leg pain or swelling. No cough.   Past Medical History  Diagnosis Date  . Asthma   . Depression   . Schizoaffective disorder, bipolar type    History reviewed. No pertinent past surgical history. No family history on file. History  Substance Use Topics  . Smoking status: Former Smoker -- 1.00 packs/day  . Smokeless tobacco: Not on file  . Alcohol Use: No   OB History   Grav Para Term Preterm Abortions TAB SAB Ect Mult Living                 Review of Systems  All systems reviewed and negative, other than as noted in HPI.   Allergies  Geodon; Haloperidol and related; Lithium; Oxycodone; and Prilosec  Home Medications   Prior to Admission medications   Medication Sig Start Date End Date Taking? Authorizing Hartley Urton  cetirizine (ZYRTEC) 10 MG tablet Take 10 mg by mouth daily.   Yes Historical Marly Schuld, MD  cloZAPine (CLOZARIL) 100 MG tablet Take 100 mg by mouth 2 (two) times daily.   Yes Historical Naveen Lorusso, MD  ferrous sulfate 325 (65 FE) MG tablet Take 325 mg by mouth daily.   Yes Historical Dian Minahan, MD  FLUoxetine (PROZAC) 20 MG capsule Take 20 mg by mouth daily.   Yes Historical Jamala Kohen, MD  metoprolol succinate (TOPROL-XL) 25 MG 24 hr tablet Take 25 mg by mouth daily.   Yes Historical  Evora Schechter, MD  pantoprazole (PROTONIX) 40 MG tablet Take 40 mg by mouth daily.   Yes Historical Celine Dishman, MD  ranitidine (ZANTAC) 150 MG tablet Take 150 mg by mouth 2 (two) times daily.   Yes Historical Haji Delaine, MD  senna (SENOKOT) 8.6 MG TABS tablet Take 1 tablet by mouth 2 (two) times daily.   Yes Historical Linden Tagliaferro, MD  vitamin C (ASCORBIC ACID) 500 MG tablet Take 500 mg by mouth daily.   Yes Historical Andree Heeg, MD   BP 110/61  Pulse 107  Temp(Src) 98.3 F (36.8 C) (Oral)  Resp 14  Ht 5\' 4"  (1.626 m)  Wt 173 lb (78.472 kg)  BMI 29.68 kg/m2  SpO2 99%  LMP 07/19/2013 Physical Exam  Nursing note and vitals reviewed. Constitutional: She appears well-developed and well-nourished. No distress.  HENT:  Head: Normocephalic and atraumatic.  Eyes: Conjunctivae are normal. Right eye exhibits no discharge. Left eye exhibits no discharge.  Neck: Neck supple.  Cardiovascular: Normal rate, regular rhythm and normal heart sounds.  Exam reveals no gallop and no friction rub.   No murmur heard. Pulmonary/Chest:  Breathing 30 times a minute. Respiratory rate decreases when  patient is speaking. She is able to speak in complete sentences. Lungs are clear bilaterally.  Abdominal: Soft. She exhibits no distension. There is no tenderness.  Musculoskeletal: She exhibits no edema and no tenderness.  Lower extremities symmetric  as compared to each other. No calf tenderness. Negative Homan's. No palpable cords.   Neurological: She is alert.  Skin: Skin is warm and dry.  Psychiatric: Her behavior is normal. Thought content normal.  anxious    ED Course  Procedures (including critical care time) Labs Review Labs Reviewed  PREGNANCY, URINE    Imaging Review No results found.   EKG Interpretation None      MDM   Final diagnoses:  None   23 year old female with shortness of breath. Patient extremely anxious on arrival and breathing 30-35 times a minute. She does slow down when speaking.  Able to speak in complete sentences. Her lung sounds are clear. Oxygen saturations are normal. I suspect that this is anxiety more than anything. Will give a dose of Ativan. Will continue to observe and w/u dictated by further clinical picture.   4:42 PM Now breathing 16-20/min. Lungs remain clear. o2 sats normal on RA. Suspect anxiety? Doubt PE, infectious, ACS or other emergent process.    Virgel Manifold, MD 08/05/13 [phone]

## 2013-08-02 NOTE — ED Notes (Addendum)
Per EMS: Pt was sitting in car when she had sudden onset of difficulty breathing. Pt denies any pain, EMS noted clear lung sounds. Pt states hx of asthma and panic attacks and reports episode similar "months ago". Pt arrives on non rebreather at 1005, tachypnea noted as well. Axo x4.  Pt from [facility] group home.

## 2013-08-02 NOTE — Progress Notes (Signed)
Patient provided with taxi voucher back to [facility].  Tilden Fossa, MSW, [place] Clinical Social Worker [facility] Emergency Dept. [phone]

## 2013-08-02 NOTE — Discharge Instructions (Signed)
Hyperventilation Hyperventilation is breathing that is deeper and more rapid than normal. It is usually associated with panic and anxiety. Hyperventilation can make you feel breathless. It is sometimes called overbreathing. Breathing out too much causes a decrease in the amount of carbon dioxide gas in the blood. This leads to tingling and numbness in the hands, feet, and around the mouth. If this continues, your fingers, hands, and toes may begin to spasm. Hyperventilation usually lasts 20-30 minutes and can be associated with other symptoms of panic and anxiety, including:   Chest pains or tightness.  A pounding or irregular, racing heartbeat (palpitations).  Dizziness.  Lightheadedness.  Dry mouth.  Weakness.  Confusion.  Sleep disturbance. CAUSES  Sudden onset (acute) hyperventilation is usually triggered by acute stress, anxiety, or emotional upset. Long-term (chronic) and recurring hyperventilation can occur with chronic lung problems, such emphysema or asthma. Other causes include:   Nervousness.  Stress.  Stimulant, drug, or alcohol use.  Lung disease.  Infections, such as pneumonia.  Heart problems.  Severe pain.  Waking from a bad dream.  Pregnancy.  Bleeding. HOME CARE INSTRUCTIONS  Learn and use breathing exercises that help you breathe from your diaphragm and abdomen.  Practice relaxation techniques to reduce stress, such as visualization, meditation, and muscle release.  During an attack, try breathing into a paper bag. This changes the carbon dioxide level and slows down breathing. SEEK IMMEDIATE MEDICAL CARE IF:  Your hyperventilation continues or gets worse. MAKE SURE YOU:  Understand these instructions.  Will watch your condition.  Will get help right away if you are not doing well or get worse. Document Released: 01/20/2000 Document Revised: 07/24/2011 Document Reviewed: 05/03/2011 [facility] Patient Information 2015 [place], [place]. This  information is not intended to replace advice given to you by your health care provider. Make sure you discuss any questions you have with your health care provider.  Pursed Lip Breathing Pursed lip breathing keeps airways open longer during breathing out (exhaling). This helps release trapped air from your lungs and allows fresh air to come in. Practice pursed lip breathing so you can use this method when you are feeling short of breath. HOW TO PERFORM PURSED LIP BREATHING 1. Close your mouth. 2. Breathe in (inhale) through your nose, taking a normal-sized breath. The rate of breathing in may be your normal rate, or you can try breathing in with a slow count to two or three if you feel you are not getting enough air. 3. Pucker your lips as if you were going to whistle. 4. Gently tighten your stomach muscles to help push the air out. 5. Breathe out slowly through your pursed lips. You should breathe out at least twice as long as you breathe in. 6.  Be sure you breathe out all of the air, but do not force the air out. GET HELP IF: Your shortness of breath gets worse. GET HELP RIGHT AWAY IF:  You are extremely short of breath. Document Released: 11/01/2007 Document Revised: 11/12/2012 Document Reviewed: 09/03/2012 [facility] Patient Information 2015 [place], [place]. This information is not intended to replace advice given to you by your health care provider. Make sure you discuss any questions you have with your health care provider.

## 2013-08-05 DIAGNOSIS — F259 Schizoaffective disorder, unspecified: Secondary | ICD-10-CM | POA: Diagnosis not present

## 2013-08-06 DIAGNOSIS — Z79899 Other long term (current) drug therapy: Secondary | ICD-10-CM | POA: Diagnosis not present

## 2013-08-26 DIAGNOSIS — F259 Schizoaffective disorder, unspecified: Secondary | ICD-10-CM | POA: Diagnosis not present

## 2013-08-27 DIAGNOSIS — Z13220 Encounter for screening for lipoid disorders: Secondary | ICD-10-CM | POA: Diagnosis not present

## 2013-08-27 DIAGNOSIS — R000 Tachycardia, unspecified: Secondary | ICD-10-CM | POA: Diagnosis not present

## 2013-08-27 DIAGNOSIS — K2100 Gastro-esophageal reflux disease with esophagitis, without bleeding: Secondary | ICD-10-CM | POA: Diagnosis not present

## 2013-08-27 DIAGNOSIS — Z131 Encounter for screening for diabetes mellitus: Secondary | ICD-10-CM | POA: Diagnosis not present

## 2013-08-29 ENCOUNTER — Encounter (HOSPITAL_COMMUNITY): Payer: Self-pay | Admitting: Emergency Medicine

## 2013-08-29 DIAGNOSIS — R109 Unspecified abdominal pain: Secondary | ICD-10-CM | POA: Diagnosis not present

## 2013-08-29 DIAGNOSIS — J45909 Unspecified asthma, uncomplicated: Secondary | ICD-10-CM | POA: Diagnosis not present

## 2013-08-29 DIAGNOSIS — K921 Melena: Secondary | ICD-10-CM | POA: Insufficient documentation

## 2013-08-29 DIAGNOSIS — Z79899 Other long term (current) drug therapy: Secondary | ICD-10-CM | POA: Insufficient documentation

## 2013-08-29 DIAGNOSIS — Z87891 Personal history of nicotine dependence: Secondary | ICD-10-CM | POA: Diagnosis not present

## 2013-08-29 DIAGNOSIS — R000 Tachycardia, unspecified: Secondary | ICD-10-CM | POA: Insufficient documentation

## 2013-08-29 DIAGNOSIS — F3289 Other specified depressive episodes: Secondary | ICD-10-CM | POA: Insufficient documentation

## 2013-08-29 DIAGNOSIS — F259 Schizoaffective disorder, unspecified: Secondary | ICD-10-CM | POA: Insufficient documentation

## 2013-08-29 DIAGNOSIS — F329 Major depressive disorder, single episode, unspecified: Secondary | ICD-10-CM | POA: Diagnosis not present

## 2013-08-29 NOTE — ED Notes (Addendum)
Pt BIB [facility] from Group Home. [phone]

## 2013-08-29 NOTE — ED Notes (Signed)
Pt in stating she had a bowel movement earlier and noted some bright red blood, later developed left flank pain, no distress noted

## 2013-08-30 ENCOUNTER — Emergency Department (HOSPITAL_COMMUNITY)
Admission: EM | Admit: 2013-08-30 | Discharge: 2013-08-30 | Disposition: A | Discharge status: Home / Self Care | Payer: Medicare Other | Attending: Emergency Medicine | Admitting: Emergency Medicine

## 2013-08-30 DIAGNOSIS — K921 Melena: Secondary | ICD-10-CM | POA: Diagnosis not present

## 2013-08-30 LAB — CBC WITH DIFFERENTIAL/PLATELET
BASOS ABS: 0 10*3/uL (ref 0.0–0.1)
Basophils Relative: 0 % (ref 0–1)
Eosinophils Absolute: 0 10*3/uL (ref 0.0–0.7)
Eosinophils Relative: 0 % (ref 0–5)
HCT: 35.8 % — ABNORMAL LOW (ref 36.0–46.0)
HEMOGLOBIN: 11.4 g/dL — AB (ref 12.0–15.0)
LYMPHS PCT: 36 % (ref 12–46)
Lymphs Abs: 3.5 10*3/uL (ref 0.7–4.0)
MCH: 25.4 pg — ABNORMAL LOW (ref 26.0–34.0)
MCHC: 31.8 g/dL (ref 30.0–36.0)
MCV: 79.7 fL (ref 78.0–100.0)
MONO ABS: 0.5 10*3/uL (ref 0.1–1.0)
Monocytes Relative: 6 % (ref 3–12)
NEUTROS ABS: 5.8 10*3/uL (ref 1.7–7.7)
NEUTROS PCT: 58 % (ref 43–77)
Platelets: 246 10*3/uL (ref 150–400)
RBC: 4.49 MIL/uL (ref 3.87–5.11)
RDW: 15.6 % — ABNORMAL HIGH (ref 11.5–15.5)
WBC: 9.8 10*3/uL (ref 4.0–10.5)

## 2013-08-30 LAB — BASIC METABOLIC PANEL
ANION GAP: 16 — AB (ref 5–15)
BUN: 8 mg/dL (ref 6–23)
CHLORIDE: 105 meq/L (ref 96–112)
CO2: 22 meq/L (ref 19–32)
Calcium: 9 mg/dL (ref 8.4–10.5)
Creatinine, Ser: 0.56 mg/dL (ref 0.50–1.10)
GFR calc non Af Amer: >90 mL/min (ref >90)
Glucose, Bld: 89 mg/dL (ref 70–99)
POTASSIUM: 3.9 meq/L (ref 3.7–5.3)
SODIUM: 143 meq/L (ref 137–147)

## 2013-08-30 LAB — URINALYSIS, ROUTINE W REFLEX MICROSCOPIC
Bilirubin Urine: NEGATIVE
Glucose, UA: NEGATIVE mg/dL
HGB URINE DIPSTICK: NEGATIVE
Ketones, ur: NEGATIVE mg/dL
Leukocytes, UA: NEGATIVE
Nitrite: NEGATIVE
PROTEIN: NEGATIVE mg/dL
SPECIFIC GRAVITY, URINE: 1.024 (ref 1.005–1.030)
Urobilinogen, UA: 0.2 mg/dL (ref 0.0–1.0)
pH: 5.5 (ref 5.0–8.0)

## 2013-08-30 LAB — PREGNANCY, URINE: PREG TEST UR: NEGATIVE

## 2013-08-30 LAB — POC OCCULT BLOOD, ED: Fecal Occult Bld: NEGATIVE

## 2013-08-30 MED ORDER — FENTANYL CITRATE 0.05 MG/ML IJ SOLN
50.0000 ug | INTRAMUSCULAR | Status: DC | PRN
  Administered 2013-08-30: 50 ug via INTRAVENOUS
  Filled 2013-08-30: qty 2

## 2013-08-30 MED ORDER — SODIUM CHLORIDE 0.9 % IV BOLUS (SEPSIS)
1000.0000 mL | Freq: Once | INTRAVENOUS | Status: AC
  Administered 2013-08-30: 1000 mL via INTRAVENOUS

## 2013-08-30 MED ORDER — SODIUM CHLORIDE 0.9 % IV BOLUS (SEPSIS)
500.0000 mL | Freq: Once | INTRAVENOUS | Status: DC
  Administered 2013-08-30: 500 mL via INTRAVENOUS

## 2013-08-30 NOTE — ED Provider Notes (Signed)
CSN: [id number]     Arrival date & time 08/29/13  2010 History   First MD Initiated Contact with Patient 08/30/13 0021     Chief Complaint  Patient presents with  . Flank Pain  . Melena     (Consider location/radiation/quality/duration/timing/severity/associated sxs/prior Treatment) HPI Comments: 23 year old female with history of asthma, schizoaffective disorder bipolar type presents from group home after she had bright red blood in bowel movements this evening 2 episodes with stool mixed with lighter blood. No history of similar. Patient denies colonoscopy or GI history including diverticulitis, hemorrhoids, ulcers, other. Patient has mild left lower flank pain since event. No fevers or chills or vomiting. No alcohol or liver disease, no blood thinners, mild ache intermittent.  Patient is a 23 y.o. female presenting with flank pain. The history is provided by the patient.  Flank Pain Associated symptoms include abdominal pain. Pertinent negatives include no chest pain, no headaches and no shortness of breath.    Past Medical History  Diagnosis Date  . Asthma   . Depression   . Schizoaffective disorder, bipolar type    History reviewed. No pertinent past surgical history. History reviewed. No pertinent family history. History  Substance Use Topics  . Smoking status: Former Smoker -- 1.00 packs/day  . Smokeless tobacco: Not on file  . Alcohol Use: No   OB History   Grav Para Term Preterm Abortions TAB SAB Ect Mult Living                 Review of Systems  Constitutional: Negative for fever and chills.  HENT: Negative for congestion.   Eyes: Negative for visual disturbance.  Respiratory: Negative for shortness of breath.   Cardiovascular: Negative for chest pain.  Gastrointestinal: Positive for abdominal pain and blood in stool. Negative for nausea and vomiting.  Genitourinary: Positive for flank pain. Negative for dysuria.  Musculoskeletal: Negative for back pain, neck  pain and neck stiffness.  Skin: Negative for rash.  Neurological: Negative for light-headedness and headaches.      Allergies  Geodon; Haloperidol and related; Lithium; Oxycodone; and Prilosec  Home Medications   Prior to Admission medications   Medication Sig Start Date End Date Taking? Authorizing Provider  cloZAPine (CLOZARIL) 100 MG tablet Take 100 mg by mouth 2 (two) times daily.   Yes Historical Provider, MD  ferrous sulfate 325 (65 FE) MG tablet Take 325 mg by mouth daily.   Yes Historical Provider, MD  FLUoxetine (PROZAC) 20 MG capsule Take 20 mg by mouth daily.   Yes Historical Provider, MD  metoprolol succinate (TOPROL-XL) 25 MG 24 hr tablet Take 25 mg by mouth daily.   Yes Historical Provider, MD  pantoprazole (PROTONIX) 40 MG tablet Take 40 mg by mouth daily.   Yes Historical Provider, MD  ranitidine (ZANTAC) 150 MG tablet Take 150 mg by mouth 2 (two) times daily.   Yes Historical Provider, MD  senna (SENOKOT) 8.6 MG TABS tablet Take 1 tablet by mouth 2 (two) times daily.   Yes Historical Provider, MD  vitamin C (ASCORBIC ACID) 500 MG tablet Take 500 mg by mouth daily.   Yes Historical Provider, MD   BP 123/60  Pulse 118  Temp(Src) 98.7 F (37.1 C) (Oral)  Resp 18  SpO2 99%  LMP 08/09/2013 Physical Exam  Nursing note and vitals reviewed. Constitutional: She is oriented to person, place, and time. She appears well-developed and well-nourished.  HENT:  Head: Normocephalic and atraumatic.  Mild dry mucous membranes  Eyes:  Conjunctivae are normal. Right eye exhibits no discharge. Left eye exhibits no discharge.  Neck: Normal range of motion. Neck supple. No tracheal deviation present.  Cardiovascular: Regular rhythm.  Tachycardia present.   Pulmonary/Chest: Effort normal and breath sounds normal.  Abdominal: Soft. She exhibits no distension. There is tenderness (mild left lower quadrant and left flank no guarding). There is no guarding.  Musculoskeletal: She exhibits  no edema.  Neurological: She is alert and oriented to person, place, and time.  Skin: Skin is warm. No rash noted.  Psychiatric: She has a normal mood and affect.    ED Course  Procedures (including critical care time) Labs Review Labs Reviewed  URINALYSIS, ROUTINE W REFLEX MICROSCOPIC - Abnormal; Notable for the following:    APPearance HAZY (*)    All other components within normal limits  BASIC METABOLIC PANEL - Abnormal; Notable for the following:    Anion gap 16 (*)    All other components within normal limits  CBC WITH DIFFERENTIAL - Abnormal; Notable for the following:    Hemoglobin 11.4 (*)    HCT 35.8 (*)    MCH 25.4 (*)    RDW 15.6 (*)    All other components within normal limits  PREGNANCY, URINE  OCCULT BLOOD X 1 CARD TO LAB, STOOL  POC OCCULT BLOOD, ED    Imaging Review No results found.   EKG Interpretation None      MDM   Final diagnoses:  Blood in stool   Overall healthy patient no GI history presents after 2 episodes of bright red blood per rectum discussed differential of patient's including internal hemorrhoid, diverticular, other. Plan for blood work, IV fluid bolus due to mild tachycardia and reassessment. Pain meds ordered for mild discomfort.   Patient had no bleeding in the ER, benign abdominal exam, brown stool in rectal exam. Followup outpatient discussed  Results and differential diagnosis were discussed with the patient/parent/guardian. Close follow up outpatient was discussed, comfortable with the plan.   Medications  fentaNYL (SUBLIMAZE) injection 50 mcg (50 mcg Intravenous Given 08/30/13 0126)  sodium chloride 0.9 % bolus 1,000 mL (1,000 mLs Intravenous New Bag/Given 08/30/13 0125)    Filed Vitals:   08/29/13 2022  BP: 123/60  Pulse: 118  Temp: 98.7 F (37.1 C)  TempSrc: Oral  Resp: 18  SpO2: 99%           Mariea Clonts, MD 08/30/13 [phone]

## 2013-08-30 NOTE — Discharge Instructions (Signed)
If you were given medicines take as directed.  If you are on coumadin or contraceptives realize their levels and effectiveness is altered by many different medicines.  If you have any reaction (rash, tongues swelling, other) to the medicines stop taking and see a physician.   Please follow up as directed and return to the ER or see a physician for new or worsening symptoms.  Thank you. Filed Vitals:   08/29/13 2022  BP: 123/60  Pulse: 118  Temp: 98.7 F (37.1 C)  TempSrc: Oral  Resp: 18  SpO2: 99%    Bloody Stools Bloody stools means there is blood in your poop (stool). It is a sign that there is a problem somewhere in the digestive system. It is important for your doctor to find the cause of your bleeding, so the problem can be treated.  HOME CARE  Only take medicine as told by your doctor.  Eat foods with fiber (prunes, bran cereals).  Drink enough fluids to keep your pee (urine) clear or pale yellow.  Sit in warm water (sitz bath) for 10 to 15 minutes as told by your doctor.  Know how to take your medicines (enemas, suppositories) if advised by your doctor.  Watch for signs that you are getting better or getting worse. GET HELP RIGHT AWAY IF:   You are not getting better.  You start to get better but then get worse again.  You have new problems.  You have severe bleeding from the place where poop comes out (rectum) that does not stop.  You throw up (vomit) blood.  You feel weak or pass out (faint).  You have a fever. MAKE SURE YOU:   Understand these instructions.  Will watch your condition.  Will get help right away if you are not doing well or get worse. Document Released: 01/10/2009 Document Revised: 04/16/2011 Document Reviewed: 06/09/2010 [facility] Patient Information 2015 [place], [place]. This information is not intended to replace advice given to you by your health care provider. Make sure you discuss any questions you have with your health care  provider.

## 2013-09-03 DIAGNOSIS — Z79899 Other long term (current) drug therapy: Secondary | ICD-10-CM | POA: Diagnosis not present

## 2013-09-12 ENCOUNTER — Emergency Department (HOSPITAL_COMMUNITY)
Admission: EM | Admit: 2013-09-12 | Discharge: 2013-09-13 | Disposition: A | Discharge status: Home / Self Care | Payer: Medicare Other | Attending: Emergency Medicine | Admitting: Emergency Medicine

## 2013-09-12 ENCOUNTER — Encounter (HOSPITAL_COMMUNITY): Payer: Self-pay | Admitting: Emergency Medicine

## 2013-09-12 DIAGNOSIS — F329 Major depressive disorder, single episode, unspecified: Secondary | ICD-10-CM | POA: Insufficient documentation

## 2013-09-12 DIAGNOSIS — F259 Schizoaffective disorder, unspecified: Secondary | ICD-10-CM | POA: Diagnosis not present

## 2013-09-12 DIAGNOSIS — F3289 Other specified depressive episodes: Secondary | ICD-10-CM | POA: Insufficient documentation

## 2013-09-12 DIAGNOSIS — R440 Auditory hallucinations: Secondary | ICD-10-CM

## 2013-09-12 DIAGNOSIS — F1510 Other stimulant abuse, uncomplicated: Secondary | ICD-10-CM | POA: Diagnosis not present

## 2013-09-12 DIAGNOSIS — Z79899 Other long term (current) drug therapy: Secondary | ICD-10-CM | POA: Diagnosis not present

## 2013-09-12 DIAGNOSIS — Z3202 Encounter for pregnancy test, result negative: Secondary | ICD-10-CM | POA: Diagnosis not present

## 2013-09-12 DIAGNOSIS — R443 Hallucinations, unspecified: Secondary | ICD-10-CM | POA: Insufficient documentation

## 2013-09-12 DIAGNOSIS — F250 Schizoaffective disorder, bipolar type: Secondary | ICD-10-CM

## 2013-09-12 DIAGNOSIS — J45909 Unspecified asthma, uncomplicated: Secondary | ICD-10-CM | POA: Diagnosis not present

## 2013-09-12 LAB — CBC WITH DIFFERENTIAL/PLATELET
Basophils Absolute: 0 10*3/uL (ref 0.0–0.1)
Basophils Relative: 0 % (ref 0–1)
EOS ABS: 0 10*3/uL (ref 0.0–0.7)
Eosinophils Relative: 0 % (ref 0–5)
HCT: 39.9 % (ref 36.0–46.0)
HEMOGLOBIN: 12.8 g/dL (ref 12.0–15.0)
LYMPHS ABS: 2.9 10*3/uL (ref 0.7–4.0)
LYMPHS PCT: 38 % (ref 12–46)
MCH: 25.7 pg — AB (ref 26.0–34.0)
MCHC: 32.1 g/dL (ref 30.0–36.0)
MCV: 80 fL (ref 78.0–100.0)
MONOS PCT: 7 % (ref 3–12)
Monocytes Absolute: 0.5 10*3/uL (ref 0.1–1.0)
Neutro Abs: 4.2 10*3/uL (ref 1.7–7.7)
Neutrophils Relative %: 55 % (ref 43–77)
PLATELETS: 291 10*3/uL (ref 150–400)
RBC: 4.99 MIL/uL (ref 3.87–5.11)
RDW: 14.9 % (ref 11.5–15.5)
WBC: 7.6 10*3/uL (ref 4.0–10.5)

## 2013-09-12 LAB — I-STAT CHEM 8, ED
BUN: 4 mg/dL — AB (ref 6–23)
CREATININE: 0.6 mg/dL (ref 0.50–1.10)
Calcium, Ion: 1.18 mmol/L (ref 1.12–1.23)
Chloride: 110 mEq/L (ref 96–112)
GLUCOSE: 105 mg/dL — AB (ref 70–99)
HCT: 40 % (ref 36.0–46.0)
HEMOGLOBIN: 13.6 g/dL (ref 12.0–15.0)
POTASSIUM: 3.9 meq/L (ref 3.7–5.3)
SODIUM: 143 meq/L (ref 137–147)
TCO2: 19 mmol/L (ref 0–100)

## 2013-09-12 LAB — RAPID URINE DRUG SCREEN, HOSP PERFORMED
AMPHETAMINES: POSITIVE — AB
Barbiturates: NOT DETECTED
Benzodiazepines: NOT DETECTED
COCAINE: NOT DETECTED
Opiates: NOT DETECTED
TETRAHYDROCANNABINOL: NOT DETECTED

## 2013-09-12 LAB — PREGNANCY, URINE: Preg Test, Ur: NEGATIVE

## 2013-09-12 LAB — ETHANOL: Alcohol, Ethyl (B): <11 mg/dL (ref 0–11)

## 2013-09-12 NOTE — ED Notes (Signed)
Pt changed into purple scrubs and wanded by security.  Pt has one bag of belongings consisting of purse, jacket, shirt, bra, panties, flip flops, and pants.

## 2013-09-12 NOTE — ED Provider Notes (Signed)
CSN: [id number]     Arrival date & time 09/12/13  2044 History  This chart was scribed for non-physician practitioner working with Richarda Blade, MD, by Erling Conte, ED Scribe. This patient was seen in room [facility]3/[facility] and the patient's care was started at 9:22 PM.   Chief Complaint  Patient presents with  . Depression      The history is provided by the patient. No language interpreter was used.   HPI Comments: [name] is a 23 y.o. female with a h/o asthma, depression and schizoaffective disorder who presents to the Emergency Department complaining of depression and thoughts of harming herself. Patient states she has been having thoughts of cutting her wrist but does not actually want to kill herself. She states she has been depressed for several weeks. She states she has not been sleeping or eating well for the past 2 weeks. Patient believes this has been making her depression worse. She states she was supposed to see her fiance today that lives in [place] but he failed to show and that furthered her desire to cut her wrists. Pt states that she called her counselor last night and expressed to them thoughts of killing herself and they advised her to seek help. She denies any new change in living situation or anyone harming her. She states she has been taking her medications. She denies any HI, SI, auditory of visual hallucinations.      Past Medical History  Diagnosis Date  . Asthma   . Depression   . Schizoaffective disorder, bipolar type    History reviewed. No pertinent past surgical history. History reviewed. No pertinent family history. History  Substance Use Topics  . Smoking status: Former Smoker -- 1.00 packs/day  . Smokeless tobacco: Not on file  . Alcohol Use: No   OB History   Grav Para Term Preterm Abortions TAB SAB Ect Mult Living                 Review of Systems  Constitutional: Positive for appetite change (has not been eating much for 2 weeks).   Psychiatric/Behavioral: Positive for sleep disturbance (has not been sleeping much for 2 weeks), self-injury and dysphoric mood. Negative for suicidal ideas and hallucinations.  All other systems reviewed and are negative.     Allergies  Geodon; Haloperidol and related; Lithium; Oxycodone; and Prilosec  Home Medications   Prior to Admission medications   Medication Sig Start Date End Date Taking? Authorizing Provider  cloZAPine (CLOZARIL) 100 MG tablet Take 100 mg by mouth 2 (two) times daily.    Historical Provider, MD  ferrous sulfate 325 (65 FE) MG tablet Take 325 mg by mouth daily.    Historical Provider, MD  FLUoxetine (PROZAC) 20 MG capsule Take 20 mg by mouth daily.    Historical Provider, MD  metoprolol succinate (TOPROL-XL) 25 MG 24 hr tablet Take 25 mg by mouth daily.    Historical Provider, MD  pantoprazole (PROTONIX) 40 MG tablet Take 40 mg by mouth daily.    Historical Provider, MD  ranitidine (ZANTAC) 150 MG tablet Take 150 mg by mouth 2 (two) times daily.    Historical Provider, MD  senna (SENOKOT) 8.6 MG TABS tablet Take 1 tablet by mouth 2 (two) times daily.    Historical Provider, MD  vitamin C (ASCORBIC ACID) 500 MG tablet Take 500 mg by mouth daily.    Historical Provider, MD   Triage Vitals: BP 117/69  Pulse 114  Temp(Src) 98.5  F (36.9 C) (Oral)  Resp 16  SpO2 97%  LMP 08/09/2013  Physical Exam  Nursing note and vitals reviewed. Constitutional: She is oriented to person, place, and time. She appears well-developed and well-nourished. No distress.  HENT:  Head: Normocephalic and atraumatic.  Eyes: Conjunctivae and EOM are normal.  Neck: Neck supple. No tracheal deviation present.  Cardiovascular: Normal rate.   Pulmonary/Chest: Effort normal. No respiratory distress.  Musculoskeletal: Normal range of motion.  Neurological: She is alert and oriented to person, place, and time.  Skin: Skin is warm and dry.  Psychiatric: She has a normal mood and affect.  Her behavior is normal.    ED Course  Procedures (including critical care time)  DIAGNOSTIC STUDIES: Oxygen Saturation is 97% on RA, normal by my interpretation.    COORDINATION OF CARE: 9:39 PM- Will order diagnostic labs.  Pt advised of plan for treatment and pt agrees.   Labs Review Labs Reviewed - No data to display  Imaging Review No results found.   EKG Interpretation None      MDM   Final diagnoses:  None   Patient has been assessed by TTS and meets criteria for admission   I personally performed the services described in this documentation, which was scribed in my presence. The recorded information has been reviewed and is accurate.   Garald Balding, NP 09/13/13 [phone]

## 2013-09-12 NOTE — BH Assessment (Signed)
Assessment completed. Consulted with Serena Colonel, NP who recommended inpatient treatment. [facility] at capacity. TTS will contact other facilities for placement. Junius Creamer, NP has been informed of the recommendation.
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
## 2013-09-12 NOTE — ED Notes (Signed)
Pt arrived to the ED with a complaint of depression with thoughts of cutting her wrists.  Pt denies suicidal ideations.  Pt states she has been depressed for several weeks.  Pt was suppose to see her fiance today but he failed to show and that caused her to have thoughts of cutting her wrists.

## 2013-09-13 ENCOUNTER — Encounter (HOSPITAL_COMMUNITY): Payer: Self-pay | Admitting: Psychiatry

## 2013-09-13 DIAGNOSIS — R443 Hallucinations, unspecified: Secondary | ICD-10-CM | POA: Diagnosis not present

## 2013-09-13 DIAGNOSIS — F259 Schizoaffective disorder, unspecified: Secondary | ICD-10-CM

## 2013-09-13 MED ORDER — ZOLPIDEM TARTRATE 5 MG PO TABS: 5.0000 mg | ORAL_TABLET | Freq: Every evening | ORAL | Status: DC | PRN

## 2013-09-13 MED ORDER — FLUOXETINE HCL 20 MG PO CAPS
20.0000 mg | ORAL_CAPSULE | Freq: Every day | ORAL | Status: DC
  Administered 2013-09-13: 20 mg via ORAL
  Filled 2013-09-13: qty 1

## 2013-09-13 MED ORDER — CLOZAPINE 100 MG PO TABS
100.0000 mg | ORAL_TABLET | Freq: Two times a day (BID) | ORAL | Status: DC
  Administered 2013-09-13: 100 mg via ORAL
  Filled 2013-09-13 (×3): qty 1

## 2013-09-13 MED ORDER — PANTOPRAZOLE SODIUM 40 MG PO TBEC
40.0000 mg | DELAYED_RELEASE_TABLET | Freq: Every day | ORAL | Status: DC
  Administered 2013-09-13: 40 mg via ORAL
  Filled 2013-09-13: qty 1

## 2013-09-13 MED ORDER — LORAZEPAM 1 MG PO TABS: 1.0000 mg | ORAL_TABLET | Freq: Three times a day (TID) | ORAL | Status: DC | PRN

## 2013-09-13 MED ORDER — METOPROLOL SUCCINATE ER 25 MG PO TB24
25.0000 mg | ORAL_TABLET | Freq: Every day | ORAL | Status: DC
  Administered 2013-09-13: 25 mg via ORAL
  Filled 2013-09-13: qty 1

## 2013-09-13 MED ORDER — ONDANSETRON HCL 4 MG PO TABS: 4.0000 mg | ORAL_TABLET | Freq: Three times a day (TID) | ORAL | Status: DC | PRN

## 2013-09-13 NOTE — ED Notes (Signed)
Transferred to rm 11 in main ED.

## 2013-09-13 NOTE — Progress Notes (Signed)
Pt spoke with Jinny Sanders [phone]), administrator of group home. Jinny Sanders states she will pick pt up at 7:30 pm.   CSW to sign off.  Rochele Pages,     ED CSW  phone: [phone] 2:04pm

## 2013-09-13 NOTE — ED Notes (Signed)
Patient arrived to unit with no s/s of distress noted. Pt pleasant and cooperative but with dull, flat affect endorsing depression. Pt states she is upset because she was supposed to meet her fiance and he couldn't meet up with her because "something came up". Pt states she hasn't been able to see him since March and has been feeling more depressed recently. Pt verbally contracts for safety.

## 2013-09-13 NOTE — [facility] Suicide Risk Assessment (Signed)
Suicide Risk Assessment  Discharge Assessment     Demographic Factors:  Adolescent or young adult and Caucasian  Total Time spent with patient: 20 minutes  Psychiatric Specialty Exam:     Blood pressure 105/60, pulse 98, temperature 97.9 F (36.6 C), temperature source Oral, resp. rate 16, last menstrual period 08/09/2013, SpO2 96.00%.There is no weight on file to calculate BMI.  General Appearance: Casual  Eye Contact::  Good  Speech:  Normal Rate  Volume:  Normal  Mood:  Anxious  Affect:  Congruent  Thought Process:  Coherent  Orientation:  Full (Time, Place, and Person)  Thought Content:  WDL  Suicidal Thoughts:  No  Homicidal Thoughts:  No  Memory:  Immediate;   Good Recent;   Good Remote;   Good  Judgement:  Fair  Insight:  Fair  Psychomotor Activity:  Normal  Concentration:  Good  Recall:  Good  Fund of Knowledge:Good  Language: Good  Akathisia:  No  Handed:  Right  AIMS (if indicated):     Assets:  Desire for Improvement Housing Leisure Time Physical Health Resilience Social Support Transportation  Sleep:      Musculoskeletal: Strength & Muscle Tone: within normal limits Gait & Station: normal Patient leans: N/A  Mental Status Per Nursing Assessment::   On Admission:   Altercation with group home staff  Current Mental Status by Physician: NA  Loss Factors: NA  Historical Factors: Prior suicide attempts, does not feel this way now  Risk Reduction Factors:   Living with another person, especially a relative, Positive social support, Positive therapeutic relationship and Positive coping skills or problem solving skills  Continued Clinical Symptoms:  Mild anxiety  Cognitive Features That Contribute To Risk:  None  Suicide Risk:  Minimal: No identifiable suicidal ideation.  Patients presenting with no risk factors but with morbid ruminations; may be classified as minimal risk based on the severity of the depressive symptoms  Discharge  Diagnoses:   AXIS I:  Schizoaffective Disorder AXIS II:  Cluster B Traits AXIS III:   Past Medical History  Diagnosis Date  . Asthma   . Depression   . Schizoaffective disorder, bipolar type    AXIS IV:  other psychosocial or environmental problems, problems related to social environment and problems with primary support group AXIS V:  61-70 mild symptoms  Plan Of Care/Follow-up recommendations:  Activity:  as tolerated Diet:  low-sodium heart healthy diet  Is patient on multiple antipsychotic therapies at discharge:  No   Has Patient had three or more failed trials of antipsychotic monotherapy by history:  No  Recommended Plan for Multiple Antipsychotic Therapies: NA    Virgin Zellers, PMH-NP 09/13/2013, 1:46 PM

## 2013-09-13 NOTE — BH Assessment (Signed)
Nash Mantis, AC at [facility], confirmed adult unit is currently at capacity. Contacted the following facilities for placement:  BED AVAILABLE, FAXED CLINICAL INFORMATION: [facility], per [facility], per [facility], per [facility], per [facility], per [facility], per [facility], per [place]  AT CAPACITY: [facility], per [facility], per [facility], per [facility], per [facility], per [facility], per [facility], per [facility], per [facility], per [facility]  NO RESPONSE: [facility] [address] Perrin Maltese, [place], [facility] Triage Specialist [phone]

## 2013-09-13 NOTE — ED Provider Notes (Signed)
Medical screening examination/treatment/procedure(s) were performed by non-physician practitioner and as supervising physician I was immediately available for consultation/collaboration.   EKG Interpretation None        Julianne Rice, MD 09/13/13 [phone]

## 2013-09-13 NOTE — ED Notes (Signed)
Bed: HB71 Expected date:  Expected time:  Means of arrival:  Comments: Hold for Room 27

## 2013-09-13 NOTE — ED Notes (Signed)
Report CSW group home contact. Waiting for disposition.

## 2013-09-13 NOTE — ED Notes (Signed)
Denies SI/HI and no AVH.  Cooperative and in NAD. Calling group home to secure d/c transportation.
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
## 2013-09-15 DIAGNOSIS — F259 Schizoaffective disorder, unspecified: Secondary | ICD-10-CM | POA: Diagnosis not present

## 2013-09-21 ENCOUNTER — Encounter (HOSPITAL_COMMUNITY): Payer: Self-pay | Admitting: Emergency Medicine

## 2013-09-21 ENCOUNTER — Emergency Department (HOSPITAL_COMMUNITY)
Admission: EM | Admit: 2013-09-21 | Discharge: 2013-09-22 | Disposition: A | Discharge status: Home / Self Care | Payer: Medicare Other | Attending: Emergency Medicine | Admitting: Emergency Medicine

## 2013-09-21 DIAGNOSIS — F3289 Other specified depressive episodes: Secondary | ICD-10-CM | POA: Diagnosis not present

## 2013-09-21 DIAGNOSIS — Z3202 Encounter for pregnancy test, result negative: Secondary | ICD-10-CM | POA: Diagnosis not present

## 2013-09-21 DIAGNOSIS — R10819 Abdominal tenderness, unspecified site: Secondary | ICD-10-CM | POA: Diagnosis not present

## 2013-09-21 DIAGNOSIS — Z87891 Personal history of nicotine dependence: Secondary | ICD-10-CM | POA: Diagnosis not present

## 2013-09-21 DIAGNOSIS — K2970 Gastritis, unspecified, without bleeding: Secondary | ICD-10-CM | POA: Diagnosis not present

## 2013-09-21 DIAGNOSIS — J45909 Unspecified asthma, uncomplicated: Secondary | ICD-10-CM | POA: Diagnosis not present

## 2013-09-21 DIAGNOSIS — R519 Headache, unspecified: Secondary | ICD-10-CM

## 2013-09-21 DIAGNOSIS — F329 Major depressive disorder, single episode, unspecified: Secondary | ICD-10-CM | POA: Insufficient documentation

## 2013-09-21 DIAGNOSIS — K2990 Gastroduodenitis, unspecified, without bleeding: Secondary | ICD-10-CM | POA: Diagnosis not present

## 2013-09-21 DIAGNOSIS — F259 Schizoaffective disorder, unspecified: Secondary | ICD-10-CM | POA: Diagnosis not present

## 2013-09-21 DIAGNOSIS — N83209 Unspecified ovarian cyst, unspecified side: Secondary | ICD-10-CM | POA: Diagnosis not present

## 2013-09-21 DIAGNOSIS — R51 Headache: Secondary | ICD-10-CM | POA: Diagnosis not present

## 2013-09-21 DIAGNOSIS — R1032 Left lower quadrant pain: Secondary | ICD-10-CM | POA: Insufficient documentation

## 2013-09-21 DIAGNOSIS — Z79899 Other long term (current) drug therapy: Secondary | ICD-10-CM | POA: Diagnosis not present

## 2013-09-21 LAB — CBC WITH DIFFERENTIAL/PLATELET
BASOS ABS: 0 10*3/uL (ref 0.0–0.1)
Basophils Relative: 0 % (ref 0–1)
EOS ABS: 0 10*3/uL (ref 0.0–0.7)
Eosinophils Relative: 0 % (ref 0–5)
HCT: 35.9 % — ABNORMAL LOW (ref 36.0–46.0)
Hemoglobin: 11.6 g/dL — ABNORMAL LOW (ref 12.0–15.0)
Lymphocytes Relative: 16 % (ref 12–46)
Lymphs Abs: 1.8 10*3/uL (ref 0.7–4.0)
MCH: 25.6 pg — AB (ref 26.0–34.0)
MCHC: 32.3 g/dL (ref 30.0–36.0)
MCV: 79.2 fL (ref 78.0–100.0)
Monocytes Absolute: 0.5 10*3/uL (ref 0.1–1.0)
Monocytes Relative: 4 % (ref 3–12)
NEUTROS ABS: 9 10*3/uL — AB (ref 1.7–7.7)
Neutrophils Relative %: 80 % — ABNORMAL HIGH (ref 43–77)
PLATELETS: 240 10*3/uL (ref 150–400)
RBC: 4.53 MIL/uL (ref 3.87–5.11)
RDW: 14.4 % (ref 11.5–15.5)
WBC: 11.3 10*3/uL — ABNORMAL HIGH (ref 4.0–10.5)

## 2013-09-21 LAB — URINE MICROSCOPIC-ADD ON

## 2013-09-21 LAB — URINALYSIS, ROUTINE W REFLEX MICROSCOPIC
Bilirubin Urine: NEGATIVE
Glucose, UA: NEGATIVE mg/dL
KETONES UR: NEGATIVE mg/dL
NITRITE: NEGATIVE
PH: 5.5 (ref 5.0–8.0)
Protein, ur: NEGATIVE mg/dL
Specific Gravity, Urine: 1.02 (ref 1.005–1.030)
Urobilinogen, UA: 0.2 mg/dL (ref 0.0–1.0)

## 2013-09-21 LAB — PREGNANCY, URINE: PREG TEST UR: NEGATIVE

## 2013-09-21 MED ORDER — ONDANSETRON HCL 4 MG/2ML IJ SOLN
4.0000 mg | Freq: Once | INTRAMUSCULAR | Status: AC
  Administered 2013-09-21: 4 mg via INTRAVENOUS
  Filled 2013-09-21: qty 2

## 2013-09-21 MED ORDER — SODIUM CHLORIDE 0.9 % IV BOLUS (SEPSIS)
1000.0000 mL | Freq: Once | INTRAVENOUS | Status: AC
  Administered 2013-09-21: 1000 mL via INTRAVENOUS

## 2013-09-21 NOTE — ED Provider Notes (Signed)
CSN: [id number]     Arrival date & time 09/21/13  2028 History   First MD Initiated Contact with Patient 09/21/13 2241     Chief Complaint  Patient presents with  . Headache  . Abdominal Pain     (Consider location/radiation/quality/duration/timing/severity/associated sxs/prior Treatment) HPI  [name] is a 23 y.o. female complaining of complaining of left lower quadrant pain onset at 2:30 PM this afternoon states it was acute and severe 8/10. Patient also reports a frontal headache also rated at 8/10. Patient denies any fever, chills, nausea, vomiting, change in bowel or bladder habits, abnormal vaginal discharge.   Past Medical History  Diagnosis Date  . Asthma   . Depression   . Schizoaffective disorder, bipolar type    History reviewed. No pertinent past surgical history. No family history on file. History  Substance Use Topics  . Smoking status: Former Smoker -- 1.00 packs/day  . Smokeless tobacco: Not on file  . Alcohol Use: No   OB History   Grav Para Term Preterm Abortions TAB SAB Ect Mult Living                 Review of Systems  10 systems reviewed and found to be negative, except as noted in the HPI.   Allergies  Geodon; Haloperidol and related; Lithium; Oxycodone; and Prilosec  Home Medications   Prior to Admission medications   Medication Sig Start Date End Date Taking? Authorizing Provider  cloZAPine (CLOZARIL) 100 MG tablet Take 100 mg by mouth 2 (two) times daily.   Yes Historical Provider, MD  ferrous sulfate 325 (65 FE) MG tablet Take 325 mg by mouth daily.   Yes Historical Provider, MD  FLUoxetine (PROZAC) 40 MG capsule Take 40 mg by mouth daily.   Yes Historical Provider, MD  metoprolol succinate (TOPROL-XL) 25 MG 24 hr tablet Take 25 mg by mouth daily.   Yes Historical Provider, MD  pantoprazole (PROTONIX) 40 MG tablet Take 40 mg by mouth daily.   Yes Historical Provider, MD  ranitidine (ZANTAC) 150 MG tablet Take 150 mg by mouth 2 (two)  times daily.   Yes Historical Provider, MD  senna (SENOKOT) 8.6 MG TABS tablet Take 1 tablet by mouth 2 (two) times daily.   Yes Historical Provider, MD  vitamin C (ASCORBIC ACID) 500 MG tablet Take 500 mg by mouth daily.   Yes Historical Provider, MD  acetaminophen (TYLENOL) 500 MG tablet Take 1 tablet (500 mg total) by mouth every 6 (six) hours as needed. 09/22/13   Milani Lowenstein, PA-C  cephALEXin (KEFLEX) 500 MG capsule Take 1 capsule (500 mg total) by mouth 2 (two) times daily. 09/22/13   Journie Howson, PA-C  ibuprofen (ADVIL,MOTRIN) 600 MG tablet Take 1 tablet (600 mg total) by mouth every 6 (six) hours as needed. 09/22/13   Edrian Melucci, PA-C   BP 109/61  Pulse 93  Temp(Src) 98.5 F (36.9 C) (Oral)  Resp 18  Ht 5\' 4"  (1.626 m)  Wt 177 lb (80.287 kg)  BMI 30.37 kg/m2  SpO2 98%  LMP 09/07/2013 Physical Exam  Nursing note and vitals reviewed. Constitutional: She is oriented to person, place, and time. She appears well-developed and well-nourished. No distress.  HENT:  Head: Normocephalic and atraumatic.  Mouth/Throat: Oropharynx is clear and moist.  Eyes: Conjunctivae and EOM are normal. Pupils are equal, round, and reactive to light.  Neck: Normal range of motion.  FROM to C-spine. Pt can touch chin to chest without discomfort. No  TTP of midline cervical spine.   Cardiovascular: Normal rate, regular rhythm and intact distal pulses.   Pulmonary/Chest: Effort normal and breath sounds normal. No stridor. No respiratory distress. She has no wheezes. She has no rales. She exhibits no tenderness.  Abdominal: Soft. Bowel sounds are normal. She exhibits no distension and no mass. There is tenderness. There is no rebound and no guarding.    Mid left sided abdominal pain with no guarding or rebound  Genitourinary:  No CVA tenderness to palpation bilaterally.  Pelvic exam a chaperoned by technician: No rashes or lesions, there is a scant non-foul-smelling white discharge. No  cervical motion or adnexal tenderness.  Musculoskeletal: Normal range of motion. She exhibits no edema and no tenderness.  Neurological: She is alert and oriented to person, place, and time.  II-Visual fields grossly intact. III/IV/VI-Extraocular movements intact.  Pupils reactive bilaterally. V/VII-Smile symmetric, equal eyebrow raise,  facial sensation intact VIII- Hearing grossly intact IX/X-Normal gag XI-bilateral shoulder shrug XII-midline tongue extension Motor: 5/5 bilaterally with normal tone and bulk Cerebellar: Normal finger-to-nose  and normal heel-to-shin test.   Romberg negative Ambulates with a coordinated gait   Skin: Skin is warm.  Psychiatric: She has a normal mood and affect.    ED Course  Procedures (including critical care time) Labs Review Labs Reviewed  CBC WITH DIFFERENTIAL - Abnormal; Notable for the following:    WBC 11.3 (*)    Hemoglobin 11.6 (*)    HCT 35.9 (*)    MCH 25.6 (*)    Neutrophils Relative % 80 (*)    Neutro Abs 9.0 (*)    All other components within normal limits  BASIC METABOLIC PANEL - Abnormal; Notable for the following:    Potassium 3.6 (*)    Glucose, Bld 100 (*)    All other components within normal limits  HEPATIC FUNCTION PANEL - Abnormal; Notable for the following:    Total Bilirubin <0.2 (*)    All other components within normal limits  URINALYSIS, ROUTINE W REFLEX MICROSCOPIC - Abnormal; Notable for the following:    APPearance TURBID (*)    Hgb urine dipstick SMALL (*)    Leukocytes, UA MODERATE (*)    All other components within normal limits  URINE MICROSCOPIC-ADD ON - Abnormal; Notable for the following:    Squamous Epithelial / LPF FEW (*)    Bacteria, UA MANY (*)    Casts GRANULAR CAST (*)    All other components within normal limits  WET PREP, GENITAL  GC/CHLAMYDIA PROBE AMP  URINE CULTURE  LIPASE, BLOOD  PREGNANCY, URINE    Imaging Review US Transvaginal Non-ob  09/22/2013   CLINICAL DATA:  Acute onset  left lower quadrant pain.  EXAM: TRANSABDOMINAL AND TRANSVAGINAL ULTRASOUND OF PELVIS  DOPPLER ULTRASOUND OF OVARIES  TECHNIQUE: Both transabdominal and transvaginal ultrasound examinations of the pelvis were performed. Transabdominal technique was performed for global imaging of the pelvis including uterus, ovaries, adnexal regions, and pelvic cul-de-sac.  It was necessary to proceed with endovaginal exam following the transabdominal exam to visualize the adnexa and endometrium. Color and duplex Doppler ultrasound was utilized to evaluate blood flow to the ovaries.  COMPARISON:  Abdominal CT mean 05/24/2008  FINDINGS: Uterus  Measurements: 7 x 4 x 4 cm. No fibroids or other myometrial mass visualized.  Endometrium  There is accumulation of fluid within the lower uterine segment or upper endocervical canal. The fluid outlines a polypoid abnormality with vascular flow which measures 6 mm. Peripherally there is  a rim of mildly increased echogenicity which may reflect the endometrial lining (noted multiple recent negative urine pregnancy tests). In retrospect, there was a low-attenuation structure at the level of the lower uterine segment or cervix on abdominal CT in 2010. Although this would usually represent nabothian cyst, it could represent the current abnormality instead.  Right ovary  Measurements: 2.4 x 1.4 x 1.6 cm. Normal appearance/no adnexal mass.  Left ovary  Measurements: 3.1 x 3.5 x 3.1 cm. Size asymmetry related to a simple 2.2 cm cyst which is likely follicular.  Pulsed Doppler evaluation of both ovaries demonstrates normal low-resistance arterial and venous waveforms.  Other findings  No complex are large volume free pelvic fluid.  IMPRESSION: 1. Negative for ovarian torsion. 2. Lower uterine segment or endocervical canal fluid outlining a 6 mm polypoid lesion. Nonemergent gynecologic referral recommended. 3. 2 cm left ovarian cyst, likely follicular.   Electronically Signed   By: Jorje Guild M.D.    On: 09/22/2013 01:53   US Pelvis Complete  09/22/2013   CLINICAL DATA:  Acute onset left lower quadrant pain.  EXAM: TRANSABDOMINAL AND TRANSVAGINAL ULTRASOUND OF PELVIS  DOPPLER ULTRASOUND OF OVARIES  TECHNIQUE: Both transabdominal and transvaginal ultrasound examinations of the pelvis were performed. Transabdominal technique was performed for global imaging of the pelvis including uterus, ovaries, adnexal regions, and pelvic cul-de-sac.  It was necessary to proceed with endovaginal exam following the transabdominal exam to visualize the adnexa and endometrium. Color and duplex Doppler ultrasound was utilized to evaluate blood flow to the ovaries.  COMPARISON:  Abdominal CT mean 05/24/2008  FINDINGS: Uterus  Measurements: 7 x 4 x 4 cm. No fibroids or other myometrial mass visualized.  Endometrium  There is accumulation of fluid within the lower uterine segment or upper endocervical canal. The fluid outlines a polypoid abnormality with vascular flow which measures 6 mm. Peripherally there is a rim of mildly increased echogenicity which may reflect the endometrial lining (noted multiple recent negative urine pregnancy tests). In retrospect, there was a low-attenuation structure at the level of the lower uterine segment or cervix on abdominal CT in 2010. Although this would usually represent nabothian cyst, it could represent the current abnormality instead.  Right ovary  Measurements: 2.4 x 1.4 x 1.6 cm. Normal appearance/no adnexal mass.  Left ovary  Measurements: 3.1 x 3.5 x 3.1 cm. Size asymmetry related to a simple 2.2 cm cyst which is likely follicular.  Pulsed Doppler evaluation of both ovaries demonstrates normal low-resistance arterial and venous waveforms.  Other findings  No complex are large volume free pelvic fluid.  IMPRESSION: 1. Negative for ovarian torsion. 2. Lower uterine segment or endocervical canal fluid outlining a 6 mm polypoid lesion. Nonemergent gynecologic referral recommended. 3. 2 cm  left ovarian cyst, likely follicular.   Electronically Signed   By: Jorje Guild M.D.   On: 09/22/2013 01:53   [name] Art/ven Flow Abd Pelv Doppler  09/22/2013   CLINICAL DATA:  Acute onset left lower quadrant pain.  EXAM: TRANSABDOMINAL AND TRANSVAGINAL ULTRASOUND OF PELVIS  DOPPLER ULTRASOUND OF OVARIES  TECHNIQUE: Both transabdominal and transvaginal ultrasound examinations of the pelvis were performed. Transabdominal technique was performed for global imaging of the pelvis including uterus, ovaries, adnexal regions, and pelvic cul-de-sac.  It was necessary to proceed with endovaginal exam following the transabdominal exam to visualize the adnexa and endometrium. Color and duplex Doppler ultrasound was utilized to evaluate blood flow to the ovaries.  COMPARISON:  Abdominal CT mean 05/24/2008  FINDINGS:  Uterus  Measurements: 7 x 4 x 4 cm. No fibroids or other myometrial mass visualized.  Endometrium  There is accumulation of fluid within the lower uterine segment or upper endocervical canal. The fluid outlines a polypoid abnormality with vascular flow which measures 6 mm. Peripherally there is a rim of mildly increased echogenicity which may reflect the endometrial lining (noted multiple recent negative urine pregnancy tests). In retrospect, there was a low-attenuation structure at the level of the lower uterine segment or cervix on abdominal CT in 2010. Although this would usually represent nabothian cyst, it could represent the current abnormality instead.  Right ovary  Measurements: 2.4 x 1.4 x 1.6 cm. Normal appearance/no adnexal mass.  Left ovary  Measurements: 3.1 x 3.5 x 3.1 cm. Size asymmetry related to a simple 2.2 cm cyst which is likely follicular.  Pulsed Doppler evaluation of both ovaries demonstrates normal low-resistance arterial and venous waveforms.  Other findings  No complex are large volume free pelvic fluid.  IMPRESSION: 1. Negative for ovarian torsion. 2. Lower uterine segment or  endocervical canal fluid outlining a 6 mm polypoid lesion. Nonemergent gynecologic referral recommended. 3. 2 cm left ovarian cyst, likely follicular.   Electronically Signed   By: Jorje Guild M.D.   On: 09/22/2013 01:53     EKG Interpretation None      MDM   Final diagnoses:  LLQ abdominal pain  Acute nonintractable headache, unspecified headache type    Filed Vitals:   09/21/13 2045 09/22/13 0024  BP: 118/61 109/61  Pulse: 72 93  Temp: 98.5 F (36.9 C)   TempSrc: Oral   Resp: 18 18  Height: 5\' 4"  (1.626 m)   Weight: 177 lb (80.287 kg)   SpO2: 100% 98%    Medications  sodium chloride 0.9 % bolus 1,000 mL (0 mLs Intravenous Stopped 09/22/13 0022)  ondansetron (ZOFRAN) injection 4 mg (4 mg Intravenous Given 09/21/13 2329)  cefTRIAXone (ROCEPHIN) 1 g in dextrose 5 % 50 mL IVPB (0 g Intravenous Stopped 09/22/13 0051)  traMADol (ULTRAM) tablet 50 mg (50 mg Oral Given 09/22/13 0021)    [name] is a 23 y.o. female presenting with left-sided abdominal pain and headache acute in onset. Serial abdominal exams are benign. It was acute in onset, ultrasound to rule out torsion is pending. Patient is a mild leukocytosis of 11.3. Urinalysis and moderate leukocytes. 3 to 6 white blood cells and many bacteria. Patient will be given Rocephin.    Pain improved in the ED. Serial abdominal exams are benign. Neuro exam is nonfocal. Bowels are, patient has no meningeal signs  Evaluation does not show pathology that would require ongoing emergent intervention or inpatient treatment. Pt is hemodynamically stable and mentating appropriately. Discussed findings and plan with patient/guardian, who agrees with care plan. All questions answered. Return precautions discussed and outpatient follow up given.   New Prescriptions   ACETAMINOPHEN (TYLENOL) 500 MG TABLET    Take 1 tablet (500 mg total) by mouth every 6 (six) hours as needed.   CEPHALEXIN (KEFLEX) 500 MG CAPSULE    Take 1 capsule (500  mg total) by mouth 2 (two) times daily.   IBUPROFEN (ADVIL,MOTRIN) 600 MG TABLET    Take 1 tablet (600 mg total) by mouth every 6 (six) hours as needed.         Monico Blitz, PA-C 09/22/13 [id number]

## 2013-09-21 NOTE — ED Notes (Signed)
Per EMS pt is c/o headache and left upper quadrant abd pain that started today around 2pm  Pt states the abd pain is sharp and stabbing  Rates pain 8/10  Pts headache is in the front of her head  Pt states she is from a group home and is not allowed to take anything for the pain until cleared by a dr

## 2013-09-21 NOTE — ED Notes (Signed)
Pt states her head is starting to hurt again and she is now feeling dizzy

## 2013-09-22 ENCOUNTER — Emergency Department (HOSPITAL_COMMUNITY): Payer: Medicare Other

## 2013-09-22 DIAGNOSIS — R1032 Left lower quadrant pain: Secondary | ICD-10-CM | POA: Diagnosis not present

## 2013-09-22 DIAGNOSIS — N83209 Unspecified ovarian cyst, unspecified side: Secondary | ICD-10-CM | POA: Diagnosis not present

## 2013-09-22 LAB — GC/CHLAMYDIA PROBE AMP
CT Probe RNA: NEGATIVE
GC PROBE AMP APTIMA: NEGATIVE

## 2013-09-22 LAB — HEPATIC FUNCTION PANEL
ALT: 12 U/L (ref 0–35)
AST: 12 U/L (ref 0–37)
Albumin: 3.8 g/dL (ref 3.5–5.2)
Alkaline Phosphatase: 91 U/L (ref 39–117)
Bilirubin, Direct: <0.2 mg/dL (ref 0.0–0.3)
Total Bilirubin: <0.2 mg/dL — ABNORMAL LOW (ref 0.3–1.2)
Total Protein: 7 g/dL (ref 6.0–8.3)

## 2013-09-22 LAB — BASIC METABOLIC PANEL
ANION GAP: 14 (ref 5–15)
BUN: 6 mg/dL (ref 6–23)
CHLORIDE: 102 meq/L (ref 96–112)
CO2: 23 mEq/L (ref 19–32)
Calcium: 9.3 mg/dL (ref 8.4–10.5)
Creatinine, Ser: 0.66 mg/dL (ref 0.50–1.10)
Glucose, Bld: 100 mg/dL — ABNORMAL HIGH (ref 70–99)
POTASSIUM: 3.6 meq/L — AB (ref 3.7–5.3)
SODIUM: 139 meq/L (ref 137–147)

## 2013-09-22 LAB — WET PREP, GENITAL
Clue Cells Wet Prep HPF POC: NONE SEEN
TRICH WET PREP: NONE SEEN
WBC, Wet Prep HPF POC: NONE SEEN
YEAST WET PREP: NONE SEEN

## 2013-09-22 LAB — LIPASE, BLOOD: LIPASE: 26 U/L (ref 11–59)

## 2013-09-22 MED ORDER — TRAMADOL HCL 50 MG PO TABS
50.0000 mg | ORAL_TABLET | Freq: Once | ORAL | Status: AC
  Administered 2013-09-22: 50 mg via ORAL
  Filled 2013-09-22: qty 1

## 2013-09-22 MED ORDER — IBUPROFEN 600 MG PO TABS: 600.0000 mg | ORAL_TABLET | Freq: Four times a day (QID) | ORAL | Status: DC | PRN

## 2013-09-22 MED ORDER — CEPHALEXIN 500 MG PO CAPS: 500.0000 mg | ORAL_CAPSULE | Freq: Two times a day (BID) | ORAL | Status: DC

## 2013-09-22 MED ORDER — CEFTRIAXONE SODIUM 1 G IJ SOLR
1.0000 g | Freq: Once | INTRAMUSCULAR | Status: AC
  Administered 2013-09-22: 1 g via INTRAVENOUS
  Filled 2013-09-22: qty 10

## 2013-09-22 MED ORDER — ACETAMINOPHEN 500 MG PO TABS: 500.0000 mg | ORAL_TABLET | Freq: Four times a day (QID) | ORAL | Status: DC | PRN

## 2013-09-22 NOTE — Discharge Instructions (Signed)
Take your antibiotics as directed and to completion. You should never have any leftover antibiotics! Push fluids and stay well hydrated.   Please follow with your primary care doctor in the next 2 days for a check-up. They must obtain records for further management.   Do not hesitate to return to the Emergency Department for any new, worsening or concerning symptoms.    Abdominal Pain, Women Abdominal (stomach, pelvic, or belly) pain can be caused by many things. It is important to tell your doctor:  The location of the pain.  Does it come and go or is it present all the time?  Are there things that start the pain (eating certain foods, exercise)?  Are there other symptoms associated with the pain (fever, nausea, vomiting, diarrhea)? All of this is helpful to know when trying to find the cause of the pain. CAUSES   Stomach: virus or bacteria infection, or ulcer.  Intestine: appendicitis (inflamed appendix), regional ileitis (Crohn's disease), ulcerative colitis (inflamed colon), irritable bowel syndrome, diverticulitis (inflamed diverticulum of the colon), or cancer of the stomach or intestine.  Gallbladder disease or stones in the gallbladder.  Kidney disease, kidney stones, or infection.  Pancreas infection or cancer.  Fibromyalgia (pain disorder).  Diseases of the female organs:  Uterus: fibroid (non-cancerous) tumors or infection.  Fallopian tubes: infection or tubal pregnancy.  Ovary: cysts or tumors.  Pelvic adhesions (scar tissue).  Endometriosis (uterus lining tissue growing in the pelvis and on the pelvic organs).  Pelvic congestion syndrome (female organs filling up with blood just before the menstrual period).  Pain with the menstrual period.  Pain with ovulation (producing an egg).  Pain with an IUD (intrauterine device, birth control) in the uterus.  Cancer of the female organs.  Functional pain (pain not caused by a disease, may improve without  treatment).  Psychological pain.  Depression. DIAGNOSIS  Your doctor will decide the seriousness of your pain by doing an examination.  Blood tests.  X-rays.  Ultrasound.  CT scan (computed tomography, special type of X-ray).  MRI (magnetic resonance imaging).  Cultures, for infection.  Barium enema (dye inserted in the large intestine, to better view it with X-rays).  Colonoscopy (looking in intestine with a lighted tube).  Laparoscopy (minor surgery, looking in abdomen with a lighted tube).  Major abdominal exploratory surgery (looking in abdomen with a large incision). TREATMENT  The treatment will depend on the cause of the pain.   Many cases can be observed and treated at home.  Over-the-counter medicines recommended by your caregiver.  Prescription medicine.  Antibiotics, for infection.  Birth control pills, for painful periods or for ovulation pain.  Hormone treatment, for endometriosis.  Nerve blocking injections.  Physical therapy.  Antidepressants.  Counseling with a psychologist or psychiatrist.  Minor or major surgery. HOME CARE INSTRUCTIONS   Do not take laxatives, unless directed by your caregiver.  Take over-the-counter pain medicine only if ordered by your caregiver. Do not take aspirin because it can cause an upset stomach or bleeding.  Try a clear liquid diet (broth or water) as ordered by your caregiver. Slowly move to a bland diet, as tolerated, if the pain is related to the stomach or intestine.  Have a thermometer and take your temperature several times a day, and record it.  Bed rest and sleep, if it helps the pain.  Avoid sexual intercourse, if it causes pain.  Avoid stressful situations.  Keep your follow-up appointments and tests, as your caregiver orders.  If the pain does not go away with medicine or surgery, you may try:  Acupuncture.  Relaxation exercises (yoga, meditation).  Group therapy.  Counseling. SEEK  MEDICAL CARE IF:   You notice certain foods cause stomach pain.  Your home care treatment is not helping your pain.  You need stronger pain medicine.  You want your IUD removed.  You feel faint or lightheaded.  You develop nausea and vomiting.  You develop a rash.  You are having side effects or an allergy to your medicine. SEEK IMMEDIATE MEDICAL CARE IF:   Your pain does not go away or gets worse.  You have a fever.  Your pain is felt only in portions of the abdomen. The right side could possibly be appendicitis. The left lower portion of the abdomen could be colitis or diverticulitis.  You are passing blood in your stools (bright red or black tarry stools, with or without vomiting).  You have blood in your urine.  You develop chills, with or without a fever.  You pass out. MAKE SURE YOU:   Understand these instructions.  Will watch your condition.  Will get help right away if you are not doing well or get worse. Document Released: 11/19/2006 Document Revised: 06/08/2013 Document Reviewed: 12/09/2008 [facility] Patient Information 2015 [place], [place]. This information is not intended to replace advice given to you by your health care provider. Make sure you discuss any questions you have with your health care provider.

## 2013-09-23 NOTE — ED Provider Notes (Signed)
Medical screening examination/treatment/procedure(s) were performed by non-physician practitioner and as supervising physician I was immediately available for consultation/collaboration.   EKG Interpretation None       Virgel Manifold, MD 09/23/13 1418

## 2013-09-24 LAB — URINE CULTURE: Colony Count: >=100000

## 2013-09-25 DIAGNOSIS — F259 Schizoaffective disorder, unspecified: Secondary | ICD-10-CM | POA: Diagnosis not present

## 2013-09-26 ENCOUNTER — Telehealth (HOSPITAL_BASED_OUTPATIENT_CLINIC_OR_DEPARTMENT_OTHER): Payer: Self-pay

## 2013-09-26 NOTE — Telephone Encounter (Signed)
Post ED Visit - Positive Culture Follow-up  Culture report reviewed by antimicrobial stewardship pharmacist: []  Wes Dulaney, Pharm.D., BCPS []  Heide Guile, Pharm.D., BCPS []  Alycia Rossetti, Pharm.D., BCPS []  [place], Pharm.D., BCPS, AAHIVP []  Legrand Como, Pharm.D., BCPS, AAHIVP [x]  Hassie Bruce, Pharm.D. []  Milus Glazier, [place].D.  Positive Urine culture, >/= 100,000 colonies -> E Coli Treated with Cephalexin, organism sensitive to the same and no further patient follow-up is required at this time.  Dortha Kern 09/26/2013, 8:12 PM

## 2013-10-01 DIAGNOSIS — Z79899 Other long term (current) drug therapy: Secondary | ICD-10-CM | POA: Diagnosis not present

## 2013-10-19 DIAGNOSIS — F259 Schizoaffective disorder, unspecified: Secondary | ICD-10-CM | POA: Diagnosis not present

## 2013-10-24 ENCOUNTER — Emergency Department (HOSPITAL_COMMUNITY)
Admission: EM | Admit: 2013-10-24 | Discharge: 2013-10-26 | Disposition: A | Discharge status: Home / Self Care | Payer: Medicare Other | Attending: Emergency Medicine | Admitting: Emergency Medicine

## 2013-10-24 ENCOUNTER — Encounter (HOSPITAL_COMMUNITY): Payer: Self-pay | Admitting: Emergency Medicine

## 2013-10-24 DIAGNOSIS — Z3202 Encounter for pregnancy test, result negative: Secondary | ICD-10-CM | POA: Diagnosis not present

## 2013-10-24 DIAGNOSIS — K219 Gastro-esophageal reflux disease without esophagitis: Secondary | ICD-10-CM | POA: Insufficient documentation

## 2013-10-24 DIAGNOSIS — Z87891 Personal history of nicotine dependence: Secondary | ICD-10-CM | POA: Insufficient documentation

## 2013-10-24 DIAGNOSIS — J45909 Unspecified asthma, uncomplicated: Secondary | ICD-10-CM | POA: Insufficient documentation

## 2013-10-24 DIAGNOSIS — G252 Other specified forms of tremor: Secondary | ICD-10-CM

## 2013-10-24 DIAGNOSIS — K5900 Constipation, unspecified: Secondary | ICD-10-CM | POA: Diagnosis not present

## 2013-10-24 DIAGNOSIS — R443 Hallucinations, unspecified: Secondary | ICD-10-CM | POA: Diagnosis not present

## 2013-10-24 DIAGNOSIS — G250 Essential tremor: Secondary | ICD-10-CM | POA: Insufficient documentation

## 2013-10-24 DIAGNOSIS — F332 Major depressive disorder, recurrent severe without psychotic features: Secondary | ICD-10-CM | POA: Diagnosis not present

## 2013-10-24 DIAGNOSIS — R45851 Suicidal ideations: Secondary | ICD-10-CM | POA: Insufficient documentation

## 2013-10-24 DIAGNOSIS — F840 Autistic disorder: Secondary | ICD-10-CM | POA: Insufficient documentation

## 2013-10-24 DIAGNOSIS — F259 Schizoaffective disorder, unspecified: Secondary | ICD-10-CM | POA: Insufficient documentation

## 2013-10-24 DIAGNOSIS — Z79899 Other long term (current) drug therapy: Secondary | ICD-10-CM | POA: Insufficient documentation

## 2013-10-24 DIAGNOSIS — F319 Bipolar disorder, unspecified: Secondary | ICD-10-CM | POA: Diagnosis not present

## 2013-10-24 DIAGNOSIS — R45850 Homicidal ideations: Secondary | ICD-10-CM | POA: Diagnosis not present

## 2013-10-24 DIAGNOSIS — F251 Schizoaffective disorder, depressive type: Secondary | ICD-10-CM

## 2013-10-24 HISTORY — DX: Other constipation: K59.09

## 2013-10-24 HISTORY — DX: Essential tremor: G25.0

## 2013-10-24 HISTORY — DX: Gastro-esophageal reflux disease without esophagitis: K21.9

## 2013-10-24 HISTORY — DX: Autistic disorder: F84.0

## 2013-10-24 LAB — COMPREHENSIVE METABOLIC PANEL
ALBUMIN: 4.1 g/dL (ref 3.5–5.2)
ALK PHOS: 93 U/L (ref 39–117)
ALT: 15 U/L (ref 0–35)
AST: 19 U/L (ref 0–37)
Anion gap: 16 — ABNORMAL HIGH (ref 5–15)
BUN: 5 mg/dL — ABNORMAL LOW (ref 6–23)
CHLORIDE: 106 meq/L (ref 96–112)
CO2: 21 mEq/L (ref 19–32)
Calcium: 9.3 mg/dL (ref 8.4–10.5)
Creatinine, Ser: 0.63 mg/dL (ref 0.50–1.10)
GFR calc Af Amer: >90 mL/min (ref >90)
GFR calc non Af Amer: >90 mL/min (ref >90)
Glucose, Bld: 121 mg/dL — ABNORMAL HIGH (ref 70–99)
POTASSIUM: 4 meq/L (ref 3.7–5.3)
SODIUM: 143 meq/L (ref 137–147)
TOTAL PROTEIN: 7.7 g/dL (ref 6.0–8.3)

## 2013-10-24 LAB — CBC
HEMATOCRIT: 39.1 % (ref 36.0–46.0)
Hemoglobin: 12.8 g/dL (ref 12.0–15.0)
MCH: 26.8 pg (ref 26.0–34.0)
MCHC: 32.7 g/dL (ref 30.0–36.0)
MCV: 82 fL (ref 78.0–100.0)
Platelets: 252 10*3/uL (ref 150–400)
RBC: 4.77 MIL/uL (ref 3.87–5.11)
RDW: 14.2 % (ref 11.5–15.5)
WBC: 9 10*3/uL (ref 4.0–10.5)

## 2013-10-24 LAB — SALICYLATE LEVEL: Salicylate Lvl: <2 mg/dL — ABNORMAL LOW (ref 2.8–20.0)

## 2013-10-24 LAB — RAPID URINE DRUG SCREEN, HOSP PERFORMED
Amphetamines: NOT DETECTED
BARBITURATES: NOT DETECTED
BENZODIAZEPINES: NOT DETECTED
COCAINE: NOT DETECTED
OPIATES: NOT DETECTED
Tetrahydrocannabinol: NOT DETECTED

## 2013-10-24 LAB — ETHANOL: Alcohol, Ethyl (B): <11 mg/dL (ref 0–11)

## 2013-10-24 LAB — ACETAMINOPHEN LEVEL: Acetaminophen (Tylenol), Serum: <15 ug/mL (ref 10–30)

## 2013-10-24 LAB — POC URINE PREG, ED: Preg Test, Ur: NEGATIVE

## 2013-10-24 LAB — PREGNANCY, URINE: Preg Test, Ur: NEGATIVE

## 2013-10-24 MED ORDER — FERROUS SULFATE 325 (65 FE) MG PO TABS
325.0000 mg | ORAL_TABLET | Freq: Every day | ORAL | Status: DC
  Administered 2013-10-24 – 2013-10-26 (×3): 325 mg via ORAL
  Filled 2013-10-24 (×3): qty 1

## 2013-10-24 MED ORDER — IBUPROFEN 200 MG PO TABS
600.0000 mg | ORAL_TABLET | Freq: Three times a day (TID) | ORAL | Status: DC | PRN
  Administered 2013-10-24: 600 mg via ORAL
  Filled 2013-10-24 (×2): qty 3

## 2013-10-24 MED ORDER — CLOZAPINE 100 MG PO TABS
100.0000 mg | ORAL_TABLET | Freq: Two times a day (BID) | ORAL | Status: DC
  Administered 2013-10-24 – 2013-10-26 (×3): 100 mg via ORAL
  Filled 2013-10-24 (×5): qty 1

## 2013-10-24 MED ORDER — FLUOXETINE HCL 20 MG PO CAPS
40.0000 mg | ORAL_CAPSULE | Freq: Every day | ORAL | Status: DC
  Administered 2013-10-25 – 2013-10-26 (×2): 40 mg via ORAL
  Filled 2013-10-24 (×2): qty 2

## 2013-10-24 MED ORDER — ONDANSETRON HCL 4 MG PO TABS
4.0000 mg | ORAL_TABLET | Freq: Once | ORAL | Status: AC
  Administered 2013-10-24: 4 mg via ORAL
  Filled 2013-10-24: qty 1

## 2013-10-24 MED ORDER — ALUM & MAG HYDROXIDE-SIMETH 200-200-20 MG/5ML PO SUSP: 30.0000 mL | ORAL | Status: DC | PRN

## 2013-10-24 MED ORDER — PANTOPRAZOLE SODIUM 40 MG PO TBEC
40.0000 mg | DELAYED_RELEASE_TABLET | Freq: Every day | ORAL | Status: DC
  Administered 2013-10-24 – 2013-10-26 (×3): 40 mg via ORAL
  Filled 2013-10-24 (×3): qty 1

## 2013-10-24 MED ORDER — SENNA 8.6 MG PO TABS
1.0000 | ORAL_TABLET | Freq: Two times a day (BID) | ORAL | Status: DC
  Administered 2013-10-24 – 2013-10-26 (×3): 8.6 mg via ORAL
  Filled 2013-10-24 (×4): qty 1

## 2013-10-24 MED ORDER — ACETAMINOPHEN 325 MG PO TABS
650.0000 mg | ORAL_TABLET | ORAL | Status: DC | PRN
  Administered 2013-10-25 – 2013-10-26 (×2): 650 mg via ORAL
  Filled 2013-10-24 (×2): qty 2

## 2013-10-24 MED ORDER — NICOTINE 21 MG/24HR TD PT24
21.0000 mg | MEDICATED_PATCH | Freq: Every day | TRANSDERMAL | Status: DC
  Administered 2013-10-25 – 2013-10-26 (×2): 21 mg via TRANSDERMAL
  Filled 2013-10-24 (×3): qty 1

## 2013-10-24 MED ORDER — METOPROLOL SUCCINATE ER 25 MG PO TB24
25.0000 mg | ORAL_TABLET | Freq: Every day | ORAL | Status: DC
  Administered 2013-10-24 – 2013-10-26 (×2): 25 mg via ORAL
  Filled 2013-10-24 (×3): qty 1

## 2013-10-24 MED ORDER — ONDANSETRON HCL 4 MG PO TABS: 4.0000 mg | ORAL_TABLET | Freq: Three times a day (TID) | ORAL | Status: DC | PRN

## 2013-10-24 NOTE — ED Provider Notes (Signed)
CSN: [id number]     Arrival date & time 10/24/13  1858 History   First MD Initiated Contact with Patient 10/24/13 1944     Chief Complaint  Patient presents with  . Suicidal    HPI [name] is a 23 y.o. female with PMH of depression, schizoaffective disorder, bipolar type presenting with thoughts of suicide. Patient has been hospitalized over 30 times and presented 9 times for detox. Patient reports increased depression after seeing her brother today. Patient had  Thoughts of wanting to use a razor to slice her throat. No attempts. Patient came in today for one last try to get better. She denies HI, harm to others. Patient also reports auditory hallucinations of voices telling her to kill herself and not go to the ED for help. Patient reports only hearing these voices during suicidal thoughts or severe depression. [facility] is [facility] by [facility] and takes prozac and last month increased to dose without improvement of depression. Patient denies illicit or IV drug use. No alcohol use today. Former smoker.    Past Medical History  Diagnosis Date  . Asthma   . Depression   . Schizoaffective disorder, bipolar type   . Autism   . Essential tremor   . Chronic constipation   . Acid reflux    Past Surgical History  Procedure Laterality Date  . Mouth surgery     No family history on file. History  Substance Use Topics  . Smoking status: Former Smoker -- 1.00 packs/day  . Smokeless tobacco: Not on file  . Alcohol Use: No   OB History   Grav Para Term Preterm Abortions TAB SAB Ect Mult Living                 Review of Systems  Constitutional: Negative for fever and chills.  HENT: Negative for congestion and rhinorrhea.   Eyes: Negative for visual disturbance.  Respiratory: Negative for cough and shortness of breath.   Cardiovascular: Negative for chest pain and palpitations.  Gastrointestinal: Negative for nausea, vomiting and diarrhea.  Genitourinary: Negative for dysuria and  hematuria.  Musculoskeletal: Negative for back pain and gait problem.  Skin: Negative for rash.  Neurological: Negative for weakness and headaches.      Allergies  Coconut flavor; Geodon; Haloperidol and related; Lithium; Oxycodone; and Prilosec  Home Medications   Prior to Admission medications   Medication Sig Start Date End Date Taking? Authorizing Provider  cloZAPine (CLOZARIL) 100 MG tablet Take 100 mg by mouth 2 (two) times daily.   Yes Historical Provider, MD  ferrous sulfate 325 (65 FE) MG tablet Take 325 mg by mouth daily.   Yes Historical Provider, MD  FLUoxetine (PROZAC) 40 MG capsule Take 40 mg by mouth daily.   Yes Historical Provider, MD  metoprolol succinate (TOPROL-XL) 25 MG 24 hr tablet Take 25 mg by mouth daily.   Yes Historical Provider, MD  pantoprazole (PROTONIX) 40 MG tablet Take 40 mg by mouth daily.   Yes Historical Provider, MD  ranitidine (ZANTAC) 150 MG tablet Take 150 mg by mouth 2 (two) times daily.   Yes Historical Provider, MD  senna (SENOKOT) 8.6 MG TABS tablet Take 1 tablet by mouth 2 (two) times daily.   Yes Historical Provider, MD  vitamin C (ASCORBIC ACID) 500 MG tablet Take 500 mg by mouth daily.   Yes Historical Provider, MD   BP 111/66  Pulse 132  Temp(Src) 99.1 F (37.3 C) (Oral)  Resp 20  Ht 5'  4" (1.626 m)  Wt 177 lb (80.287 kg)  BMI 30.37 kg/m2  SpO2 98%  LMP 10/03/2013 Physical Exam  Nursing note and vitals reviewed. Constitutional: She appears well-developed and well-nourished. No distress.  HENT:  Head: Normocephalic and atraumatic.  Eyes: Conjunctivae and EOM are normal. Right eye exhibits no discharge. Left eye exhibits no discharge.  Cardiovascular: Normal rate, regular rhythm and normal heart sounds.   Pulmonary/Chest: Effort normal and breath sounds normal. No respiratory distress. She has no wheezes.  Abdominal: Soft. Bowel sounds are normal. She exhibits no distension. There is no tenderness.  Neurological: She is alert.  She exhibits normal muscle tone. Coordination normal.  Normal gait.  Skin: Skin is warm and dry. She is not diaphoretic.  Patient without injection marks.    ED Course  Procedures (including critical care time) Labs Review Labs Reviewed  CBC  ACETAMINOPHEN LEVEL  COMPREHENSIVE METABOLIC PANEL  ETHANOL  SALICYLATE LEVEL  URINE RAPID DRUG SCREEN (HOSP PERFORMED)  POC URINE PREG, ED    Imaging Review No results found.   EKG Interpretation None     Meds given in ED:  Medications  ibuprofen (ADVIL,MOTRIN) tablet 600 mg (not administered)  acetaminophen (TYLENOL) tablet 650 mg (not administered)  cloZAPine (CLOZARIL) tablet 100 mg (not administered)  ferrous sulfate tablet 325 mg (not administered)  FLUoxetine (PROZAC) capsule 40 mg (not administered)  metoprolol succinate (TOPROL-XL) 24 hr tablet 25 mg (not administered)  pantoprazole (PROTONIX) EC tablet 40 mg (not administered)  senna (SENOKOT) tablet 8.6 mg (not administered)  ondansetron (ZOFRAN) tablet 4 mg (not administered)  ondansetron (ZOFRAN) tablet 4 mg (not administered)  alum & mag hydroxide-simeth (MAALOX/MYLANTA) 200-200-20 MG/5ML suspension 30 mL (not administered)  nicotine (NICODERM CQ - dosed in mg/24 hours) patch 21 mg (not administered)    New Prescriptions   No medications on file      MDM   Final diagnoses:  None    Patient has been medically cleared in the ED and is awaiting consult by ACT team for possible placement vs OP clinic information. Pt is currently having SI and hallucinations. No HI and appears stable in NAD. Pt is cleared to be moved back to [facility].      Pura Spice, PA-C 10/24/13 2028

## 2013-10-24 NOTE — ED Notes (Signed)
Sitter at bedside.

## 2013-10-24 NOTE — ED Provider Notes (Signed)
Medical screening examination/treatment/procedure(s) were performed by non-physician practitioner and as supervising physician I was immediately available for consultation/collaboration.   EKG Interpretation None        Hoy Morn, MD 10/24/13 2213

## 2013-10-24 NOTE — BH Assessment (Signed)
Consulted with Evelyn Colonel, NP who recommends inpatient treatment. Al Corpus, PA-C has been informed of the recommendations. [facility] at capacity. TTS will contact other facilities for placement.
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
## 2013-10-24 NOTE — ED Notes (Signed)
Tele psych being done at this time by assessment team

## 2013-10-24 NOTE — ED Notes (Signed)
Pt. and belongings wanded by security 

## 2013-10-24 NOTE — ED Notes (Signed)
Pt presents with c/o suicidal thoughts. Pt reports that she was seen recently for same with wanting to cut herself. Pt reports that she would hurt herself any way she could think of. Pt is talking very rapidly and appears very anxious. Pt denies any HI.

## 2013-10-25 ENCOUNTER — Encounter (HOSPITAL_COMMUNITY): Payer: Self-pay | Admitting: Registered Nurse

## 2013-10-25 DIAGNOSIS — R45851 Suicidal ideations: Secondary | ICD-10-CM

## 2013-10-25 DIAGNOSIS — F332 Major depressive disorder, recurrent severe without psychotic features: Secondary | ICD-10-CM | POA: Diagnosis not present

## 2013-10-25 DIAGNOSIS — R45850 Homicidal ideations: Secondary | ICD-10-CM

## 2013-10-25 DIAGNOSIS — F259 Schizoaffective disorder, unspecified: Secondary | ICD-10-CM | POA: Diagnosis not present

## 2013-10-25 LAB — DIFFERENTIAL
BASOS PCT: 0 % (ref 0–1)
Basophils Absolute: 0 10*3/uL (ref 0.0–0.1)
Eosinophils Absolute: 0 10*3/uL (ref 0.0–0.7)
Eosinophils Relative: 0 % (ref 0–5)
Lymphocytes Relative: 26 % (ref 12–46)
Lymphs Abs: 2.3 10*3/uL (ref 0.7–4.0)
MONOS PCT: 7 % (ref 3–12)
Monocytes Absolute: 0.6 10*3/uL (ref 0.1–1.0)
NEUTROS ABS: 6.1 10*3/uL (ref 1.7–7.7)
Neutrophils Relative %: 67 % (ref 43–77)

## 2013-10-25 NOTE — ED Notes (Signed)
Pt denies pain. Pt is here for SI and confirms. Pt in NAD and A&O. Pt sitter at bedside. Will continue to monitor

## 2013-10-25 NOTE — ED Notes (Signed)
Pt ambulated to BR and back to bed w/o assistance

## 2013-10-25 NOTE — ED Notes (Signed)
Pt resting quietly at this time with even, unlabored resp.Sitter at bedside. Will continue to monitor

## 2013-10-25 NOTE — ED Notes (Signed)
Two bags placed in [place] number 29
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
## 2013-10-25 NOTE — ED Notes (Signed)
Patient asleep; no s/s of distress noted at this time. Respirations regular and unlabored, skin warm and dry. Sitter at bedside for safety and monitoring.

## 2013-10-25 NOTE — ED Notes (Signed)
Report given to Manuela Schwartz in [place]
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
## 2013-10-25 NOTE — ED Notes (Signed)
Pt is asleep,  Sitter is at bedside,  Pt in NAD

## 2013-10-26 ENCOUNTER — Encounter (HOSPITAL_COMMUNITY): Payer: Self-pay | Admitting: Psychiatry

## 2013-10-26 DIAGNOSIS — F259 Schizoaffective disorder, unspecified: Secondary | ICD-10-CM | POA: Diagnosis not present

## 2013-10-26 NOTE — [facility] Suicide Risk Assessment (Addendum)
Suicide Risk Assessment  Discharge Assessment     Demographic Factors:  Adolescent or young adult and Caucasian  Total Time spent with patient: 20 minutes  Psychiatric Specialty Exam:     Blood pressure 95/55, pulse 75, temperature 97.9 F (36.6 C), temperature source Oral, resp. rate 18, height 5\' 4"  (1.626 m), weight 177 lb (80.287 kg), last menstrual period 10/03/2013, SpO2 99.00%.Body mass index is 30.37 kg/(m^2).  General Appearance: Casual  Eye Contact::  Good  Speech:  Normal Rate  Volume:  Normal  Mood:  Depressed  Affect:  Congruent  Thought Process:  Coherent  Orientation:  Full (Time, Place, and Person)  Thought Content:  WDL  Suicidal Thoughts:  Passive, no intent or plan  Homicidal Thoughts:  No  Memory:  Immediate;   Good Recent;   Good Remote;   Good  Judgement:  Fair  Insight:  Fair  Psychomotor Activity:  Normal  Concentration:  Good  Recall:  Good  Fund of Knowledge:Good  Language: Good  Akathisia:  No  Handed:  Right  AIMS (if indicated):     Assets:  Agricultural consultant Housing Leisure Time Physical Health Resilience Social Support Transportation  Sleep:      Musculoskeletal: Strength & Muscle Tone: within normal limits Gait & Station: normal Patient leans: N/A  Mental Status Per Nursing Assessment::   On Admission:   depression  Current Mental Status by Physician: NA  Loss Factors: NA  Historical Factors: NA  Risk Reduction Factors:   Sense of responsibility to family, Living with another person, especially a relative, Positive social support and Positive therapeutic relationship  Continued Clinical Symptoms:  Depression  Cognitive Features That Contribute To Risk:  None  Suicide Risk:  Minimal: No identifiable suicidal ideation.  Patients presenting with no risk factors but with morbid ruminations; may be classified as minimal risk based on the severity of the depressive  symptoms  Discharge Diagnoses:   AXIS I:  Schizoaffective Disorder AXIS II:  Cluster B traits AXIS III:   Past Medical History  Diagnosis Date  . Asthma   . Depression   . Schizoaffective disorder, bipolar type   . Autism   . Essential tremor   . Chronic constipation   . Acid reflux    AXIS IV:  other psychosocial or environmental problems and problems related to social environment AXIS V:  61-70 mild symptoms  Plan Of Care/Follow-up recommendations:  Activity:  as tolerated Diet:  low-sodium heart healthy diet  Is patient on multiple antipsychotic therapies at discharge:  No   Has Patient had three or more failed trials of antipsychotic monotherapy by history:  No  Recommended Plan for Multiple Antipsychotic Therapies: NA    Felder Lebeda, PMH-NP 10/26/2013, 2:03 PM

## 2013-10-26 NOTE — BH Assessment (Signed)
[facility] Assessment Progress Note  10/26/2013  Discharge back to group home per Dr. Dois Davenport, NP.
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
## 2013-10-26 NOTE — BHH Counselor (Signed)
Received guardianship papers from [facility]. Papers reviewed and it appears that [name] is in fact guardian or patient. Writer discussed patient's disposition with guardian. No further concerns regarding discharge reported.   Writer contacted group home [facility] [phone] and left a voicemail. Writer continue trying to contact group home.

## 2013-10-26 NOTE — BH Assessment (Addendum)
[facility] called requesting updates regarding patient's disposition. Writer informed Larkin Ina that she would need to provide this Probation officer with legal documentation before disclosing updates regarding disposition. Larkin Ina will fax legal documents to this Probation officer.

## 2013-10-26 NOTE — Progress Notes (Signed)
CSW spoke with Jinny Sanders with the group home who reports that she will be here around 6pm to pick up the patient for discharge.       Chesley Noon, MSW, [place] Clinical Social Worker [phone]

## 2013-10-26 NOTE — ED Notes (Signed)
Charge RN spoke with [facility], plan is for inpatient treatment.

## 2013-10-26 NOTE — Discharge Instructions (Signed)
Borderline Personality Disorder Borderline personality disorder is a mental health disorder. People with borderline personality disorder have unhealthy patterns of perceiving, thinking about, and reacting to their environment and events in their life. These patterns are established by adolescence or early adulthood. People with borderline personality disorder also have difficulty coping with stress on their own and fear being abandoned by others. They have difficulty controlling their emotions. Their emotions change quickly, frequently, and intensely. They are easily upset and can become very angry, very suddenly. Their unpredictable behavior often leads to problems in their relationships. They often feel worthless, unloved, and emotionally empty. CAUSES No one knows the exact cause of borderline personality disorder. Most mental health experts think that there is more than one cause. Possible contributing factors include:  Genetic factors. These are traits that are passed down from one generation to the next. Many people with borderline personality disorder have a family history of the disorder.  Physical factors. The part of the brain that controls emotion may be different in people who have borderline personality disorder.  Social factors. Traumatic experiences involving other people may play a role in the development of borderline personality disorder. Examples include neglect, abandonment, and physical and sexual abuse. SYMPTOMS Signs and symptoms of borderline personality disorder include:  A series of unstable personal relationships.  A strong fear of being abandoned and frantic efforts to avoid abandonment.  Impulsive, self-destructive behavior, such as substance abuse, irrational spending of money, unprotected sex with multiple partners, reckless driving, and binge eating.  Poor self-image that also may change a lot, or a sense of identity that is inconsistent.  Recurring self-injury or  attempted suicide.  Severe mood swings, including depression, irritability, and anxiety.  Lasting feelings of emptiness.  Difficulty controlling anger.  Temporary feelings of paranoia or loss of touch with reality. DIAGNOSIS A diagnosis of borderline personality disorder requires the presence of at least 5 of the common signs and symptoms. This information is gathered from family and friends as well as medical professionals and legal professionals who have a close association with the patient. This information is also gathered during a psychiatric assessment. During the assessment, the patient is asked about early life experiences, level of education, employment status, physical health conditions, and current prescription and over-the-counter medicines used. TREATMENT Caregivers who usually treat borderline personality disorder are mental health professionals, such as psychologists, psychiatrists, and clinical social workers. More than one type of treatment may be needed. Types of treatment include:  Psychotherapy (also known as talk therapy or counseling).  Cognitive behavioral therapy. This helps the person to recognize and change unhealthy feelings, thoughts, and behaviors. They find new, more positive thoughts and actions to replace the old ones.  Dialectical behavioral therapy. Through this type of treatment, a person learns to understand his or her feelings and to regulate them. This may be one-on-one treatment or part of group therapy.  Family therapy. This treatment includes family members.  Medicine. Medicine may be used to help control emotions, reduce reckless and self-destructive behavior, treat anxiety, and treat depression. SEEK IMMEDIATE MEDICAL CARE IF:   You cannot control your behavior or emotions.  You think about hurting yourself.  You think about suicide. FOR MORE INFORMATION National Alliance on Mental Illness: www.nami.[place]: https://carter.com/ [place]: http://bpdresourcecenter.org Document Released: 05/09/2010 Document Revised: 04/16/2011 Document Reviewed: 05/09/2010 [facility] Patient Information 2015 [place], [place]. This information is not intended to replace advice given to you by your health  care provider. Make sure you discuss any questions you have with your health care provider.

## 2013-10-29 DIAGNOSIS — Z79899 Other long term (current) drug therapy: Secondary | ICD-10-CM | POA: Diagnosis not present

## 2013-10-31 ENCOUNTER — Emergency Department (HOSPITAL_COMMUNITY)
Admission: EM | Admit: 2013-10-31 | Discharge: 2013-11-02 | Disposition: A | Discharge status: Home / Self Care | Payer: Medicare Other | Attending: Emergency Medicine | Admitting: Emergency Medicine

## 2013-10-31 ENCOUNTER — Encounter (HOSPITAL_COMMUNITY): Payer: Self-pay | Admitting: Emergency Medicine

## 2013-10-31 DIAGNOSIS — K219 Gastro-esophageal reflux disease without esophagitis: Secondary | ICD-10-CM | POA: Insufficient documentation

## 2013-10-31 DIAGNOSIS — S51809A Unspecified open wound of unspecified forearm, initial encounter: Secondary | ICD-10-CM | POA: Diagnosis not present

## 2013-10-31 DIAGNOSIS — Z79899 Other long term (current) drug therapy: Secondary | ICD-10-CM | POA: Diagnosis not present

## 2013-10-31 DIAGNOSIS — F321 Major depressive disorder, single episode, moderate: Secondary | ICD-10-CM | POA: Diagnosis not present

## 2013-10-31 DIAGNOSIS — Z87891 Personal history of nicotine dependence: Secondary | ICD-10-CM | POA: Diagnosis not present

## 2013-10-31 DIAGNOSIS — F329 Major depressive disorder, single episode, unspecified: Secondary | ICD-10-CM | POA: Diagnosis not present

## 2013-10-31 DIAGNOSIS — Z3202 Encounter for pregnancy test, result negative: Secondary | ICD-10-CM | POA: Diagnosis not present

## 2013-10-31 DIAGNOSIS — X789XXA Intentional self-harm by unspecified sharp object, initial encounter: Secondary | ICD-10-CM | POA: Diagnosis not present

## 2013-10-31 DIAGNOSIS — R45851 Suicidal ideations: Secondary | ICD-10-CM

## 2013-10-31 DIAGNOSIS — Z7289 Other problems related to lifestyle: Secondary | ICD-10-CM

## 2013-10-31 DIAGNOSIS — F489 Nonpsychotic mental disorder, unspecified: Secondary | ICD-10-CM | POA: Insufficient documentation

## 2013-10-31 DIAGNOSIS — F259 Schizoaffective disorder, unspecified: Secondary | ICD-10-CM | POA: Diagnosis present

## 2013-10-31 DIAGNOSIS — Z23 Encounter for immunization: Secondary | ICD-10-CM | POA: Insufficient documentation

## 2013-10-31 DIAGNOSIS — F603 Borderline personality disorder: Secondary | ICD-10-CM | POA: Diagnosis not present

## 2013-10-31 DIAGNOSIS — F840 Autistic disorder: Secondary | ICD-10-CM | POA: Diagnosis present

## 2013-10-31 DIAGNOSIS — Z8669 Personal history of other diseases of the nervous system and sense organs: Secondary | ICD-10-CM | POA: Insufficient documentation

## 2013-10-31 DIAGNOSIS — F3289 Other specified depressive episodes: Secondary | ICD-10-CM | POA: Insufficient documentation

## 2013-10-31 DIAGNOSIS — J45909 Unspecified asthma, uncomplicated: Secondary | ICD-10-CM | POA: Insufficient documentation

## 2013-10-31 DIAGNOSIS — F251 Schizoaffective disorder, depressive type: Secondary | ICD-10-CM | POA: Diagnosis present

## 2013-10-31 LAB — COMPREHENSIVE METABOLIC PANEL
ALT: 16 U/L (ref 0–35)
AST: 19 U/L (ref 0–37)
Albumin: 4.2 g/dL (ref 3.5–5.2)
Alkaline Phosphatase: 91 U/L (ref 39–117)
Anion gap: 15 (ref 5–15)
BUN: 8 mg/dL (ref 6–23)
CHLORIDE: 107 meq/L (ref 96–112)
CO2: 19 mEq/L (ref 19–32)
CREATININE: 0.7 mg/dL (ref 0.50–1.10)
Calcium: 9.6 mg/dL (ref 8.4–10.5)
GFR calc Af Amer: >90 mL/min (ref >90)
Glucose, Bld: 106 mg/dL — ABNORMAL HIGH (ref 70–99)
Potassium: 3.9 mEq/L (ref 3.7–5.3)
Sodium: 141 mEq/L (ref 137–147)
Total Protein: 7.9 g/dL (ref 6.0–8.3)

## 2013-10-31 LAB — CBC
HEMATOCRIT: 39.6 % (ref 36.0–46.0)
Hemoglobin: 13.2 g/dL (ref 12.0–15.0)
MCH: 27.4 pg (ref 26.0–34.0)
MCHC: 33.3 g/dL (ref 30.0–36.0)
MCV: 82.2 fL (ref 78.0–100.0)
PLATELETS: 265 10*3/uL (ref 150–400)
RBC: 4.82 MIL/uL (ref 3.87–5.11)
RDW: 14 % (ref 11.5–15.5)
WBC: 7.8 10*3/uL (ref 4.0–10.5)

## 2013-10-31 LAB — RAPID URINE DRUG SCREEN, HOSP PERFORMED
Amphetamines: NOT DETECTED
BENZODIAZEPINES: NOT DETECTED
Barbiturates: NOT DETECTED
Cocaine: NOT DETECTED
Opiates: NOT DETECTED
TETRAHYDROCANNABINOL: NOT DETECTED

## 2013-10-31 LAB — SALICYLATE LEVEL: Salicylate Lvl: <2 mg/dL — ABNORMAL LOW (ref 2.8–20.0)

## 2013-10-31 LAB — POC URINE PREG, ED: Preg Test, Ur: NEGATIVE

## 2013-10-31 LAB — ETHANOL

## 2013-10-31 LAB — ACETAMINOPHEN LEVEL: Acetaminophen (Tylenol), Serum: <15 ug/mL (ref 10–30)

## 2013-10-31 MED ORDER — VITAMIN C 500 MG PO TABS
500.0000 mg | ORAL_TABLET | Freq: Every day | ORAL | Status: DC
  Administered 2013-11-01 – 2013-11-02 (×2): 500 mg via ORAL
  Filled 2013-10-31 (×2): qty 1

## 2013-10-31 MED ORDER — PANTOPRAZOLE SODIUM 40 MG PO TBEC
40.0000 mg | DELAYED_RELEASE_TABLET | Freq: Every day | ORAL | Status: DC
  Administered 2013-11-01 – 2013-11-02 (×2): 40 mg via ORAL
  Filled 2013-10-31 (×2): qty 1

## 2013-10-31 MED ORDER — IBUPROFEN 200 MG PO TABS: 600.0000 mg | ORAL_TABLET | Freq: Three times a day (TID) | ORAL | Status: DC | PRN

## 2013-10-31 MED ORDER — ACETAMINOPHEN 325 MG PO TABS: 650.0000 mg | ORAL_TABLET | ORAL | Status: DC | PRN

## 2013-10-31 MED ORDER — METOPROLOL SUCCINATE ER 25 MG PO TB24
25.0000 mg | ORAL_TABLET | Freq: Every day | ORAL | Status: DC
  Administered 2013-11-02: 25 mg via ORAL
  Filled 2013-10-31 (×2): qty 1

## 2013-10-31 MED ORDER — CLOZAPINE 100 MG PO TABS
100.0000 mg | ORAL_TABLET | Freq: Two times a day (BID) | ORAL | Status: DC
  Administered 2013-10-31 – 2013-11-02 (×4): 100 mg via ORAL
  Filled 2013-10-31 (×6): qty 1

## 2013-10-31 MED ORDER — FLUOXETINE HCL 20 MG PO CAPS
40.0000 mg | ORAL_CAPSULE | Freq: Every day | ORAL | Status: DC
  Administered 2013-11-01 – 2013-11-02 (×2): 40 mg via ORAL
  Filled 2013-10-31 (×2): qty 2

## 2013-10-31 MED ORDER — SENNA 8.6 MG PO TABS
1.0000 | ORAL_TABLET | Freq: Two times a day (BID) | ORAL | Status: DC
  Administered 2013-11-01 – 2013-11-02 (×3): 8.6 mg via ORAL
  Filled 2013-10-31 (×4): qty 1

## 2013-10-31 MED ORDER — FERROUS SULFATE 325 (65 FE) MG PO TABS
325.0000 mg | ORAL_TABLET | Freq: Every day | ORAL | Status: DC
  Administered 2013-10-31 – 2013-11-02 (×3): 325 mg via ORAL
  Filled 2013-10-31 (×3): qty 1

## 2013-10-31 MED ORDER — TETANUS-DIPHTH-ACELL PERTUSSIS 5-2.5-18.5 LF-MCG/0.5 IM SUSP
0.5000 mL | Freq: Once | INTRAMUSCULAR | Status: AC
  Administered 2013-10-31: 0.5 mL via INTRAMUSCULAR
  Filled 2013-10-31: qty 0.5

## 2013-10-31 MED ORDER — ONDANSETRON HCL 4 MG PO TABS
4.0000 mg | ORAL_TABLET | Freq: Three times a day (TID) | ORAL | Status: DC | PRN
  Administered 2013-10-31: 4 mg via ORAL
  Filled 2013-10-31: qty 1

## 2013-10-31 MED ORDER — ALUM & MAG HYDROXIDE-SIMETH 200-200-20 MG/5ML PO SUSP: 30.0000 mL | ORAL | Status: DC | PRN

## 2013-10-31 MED ORDER — NICOTINE 21 MG/24HR TD PT24
21.0000 mg | MEDICATED_PATCH | Freq: Every day | TRANSDERMAL | Status: DC
  Filled 2013-10-31: qty 1

## 2013-10-31 NOTE — ED Notes (Signed)
She arrives in [employer] and is in the presence of a sitter.  She matter-of-factly tells [name] her birthday is tomorrow "and I want to die on my birthday".  She does not elaborate.  I assure her I am glad she is here where we can keep her safe.
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
## 2013-10-31 NOTE — ED Notes (Signed)
Patients personal belonging/clothing (3 bags) placed in locker 32.

## 2013-10-31 NOTE — ED Notes (Signed)
Pt with c/o mild nausea after her meal. PRN Zofran given.

## 2013-10-31 NOTE — BH Assessment (Signed)
Assessment completed. Consulted Darlyne Russian, PA-C who recommended inpatient treatment. TTS will contact other facilities for placement. Dr. Wilson Singer has been notified of the recommendation.
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
## 2013-10-31 NOTE — ED Notes (Signed)
Patients cell phone and $20 bill locked up with security key #2. Patient valuable [phone]

## 2013-10-31 NOTE — ED Notes (Signed)
On arrival pt states "tomorrow is my birthday and I want to kill myself, I was going to cut my neck, but I cut my arm instead." Pt has numerous superficial lacerations noted to the left forearm. Pt states that her family is in [place], her fiance is in [place], and that she was discharge to a group home in [place] after being seen at [place]. Pt denies using any drugs or alcohol. Pt states that she is hearing voices that are stating that "she would be better off dead." Pt denies visual hallucinations. Pt reports a similar episode back in April.

## 2013-10-31 NOTE — ED Provider Notes (Signed)
CSN: [id number]     Arrival date & time 10/31/13  1553 History   First MD Initiated Contact with Patient 10/31/13 1617     Chief Complaint  Patient presents with  . Suicidal     (Consider location/radiation/quality/duration/timing/severity/associated sxs/prior Treatment) HPI [name] is a(n) 24 y.o. female who presents to the ED with chief complaint of suicidal ideation, hearing voices and depression. Patient has been seen multiple times for similar complaints. She has a past medical history of schizoaffective disorder, autism. The patient lives in a group home setting. She seen on the 19th for similar complaints and discharged back to her care providers. Her medications are prescribed by [facility]. The patient states that the only medication change she has had recently is increase in her Prozac dose to 40 mg daily. She complains of hearing voices that are telling her to kill herself and that she is worthless. She states that she wanted to die before her birthday which is tomorrow. She cut herself multiple times on the left forearm with a razor blade today. She states she has a past history of multiple suicide attempts including overdosing, and herself, cutting herself. Her pain today was to cut her wrists with the razor blade however she called 911.  The patient denies any alcohol or drug abuse. She states she is taking her medications as directed. She complains of severe depression with bouts of weeping. She is unsure of her last tetanus vaccination.  Past Medical History  Diagnosis Date  . Asthma   . Depression   . Schizoaffective disorder, bipolar type   . Autism   . Essential tremor   . Chronic constipation   . Acid reflux    Past Surgical History  Procedure Laterality Date  . Mouth surgery     No family history on file. History  Substance Use Topics  . Smoking status: Former Smoker -- 1.00 packs/day  . Smokeless tobacco: Not on file  . Alcohol Use: No   OB History   Grav  Para Term Preterm Abortions TAB SAB Ect Mult Living                 Review of Systems  Ten systems reviewed and are negative for acute change, except as noted in the HPI.    Allergies  Coconut flavor; Geodon; Haloperidol and related; Lithium; Oxycodone; and Prilosec  Home Medications   Prior to Admission medications   Medication Sig Start Date End Date Taking? Authorizing Provider  cloZAPine (CLOZARIL) 100 MG tablet Take 100 mg by mouth 2 (two) times daily.    Historical Provider, MD  ferrous sulfate 325 (65 FE) MG tablet Take 325 mg by mouth daily.    Historical Provider, MD  FLUoxetine (PROZAC) 40 MG capsule Take 40 mg by mouth daily.    Historical Provider, MD  metoprolol succinate (TOPROL-XL) 25 MG 24 hr tablet Take 25 mg by mouth daily.    Historical Provider, MD  pantoprazole (PROTONIX) 40 MG tablet Take 40 mg by mouth daily.    Historical Provider, MD  ranitidine (ZANTAC) 150 MG tablet Take 150 mg by mouth 2 (two) times daily.    Historical Provider, MD  senna (SENOKOT) 8.6 MG TABS tablet Take 1 tablet by mouth 2 (two) times daily.    Historical Provider, MD  vitamin C (ASCORBIC ACID) 500 MG tablet Take 500 mg by mouth daily.    Historical Provider, MD   BP 124/71  Pulse 119  Temp(Src) 98.9 F (37.2  C) (Oral)  Resp 16  SpO2 99%  LMP 10/03/2013 Physical Exam  Constitutional: She is oriented to person, place, and time. She appears well-developed and well-nourished. No distress.  HENT:  Head: Normocephalic and atraumatic.  Eyes: Conjunctivae are normal. No scleral icterus.  Neck: Normal range of motion.  Cardiovascular: Normal rate, regular rhythm and normal heart sounds.  Exam reveals no gallop and no friction rub.   No murmur heard. Pulmonary/Chest: Effort normal and breath sounds normal. No respiratory distress.  Abdominal: Soft. Bowel sounds are normal. She exhibits no distension and no mass. There is no tenderness. There is no guarding.  Neurological: She is alert  and oriented to person, place, and time.  Skin: Skin is warm and dry. She is not diaphoretic.  Multiple, fresh artificial self-inflicted lacerations to left forearm.    ED Course  Procedures (including critical care time) Labs Review Labs Reviewed  ACETAMINOPHEN LEVEL  CBC  COMPREHENSIVE METABOLIC PANEL  ETHANOL  SALICYLATE LEVEL  URINE RAPID DRUG SCREEN (HOSP PERFORMED)    Imaging Review No results found.   EKG Interpretation None      MDM   Final diagnoses:  Self-mutilation  Suicidal ideation    Patient with suicidal ideation, self mutiliation. Will consult with Psych   Patient is medically clear     Margarita Mail, PA-C 11/01/13 0111

## 2013-10-31 NOTE — ED Notes (Signed)
Patient sitting up in bed watching t.v.. No s/s of distress noted. Patient verbally contracts for safety while on unit. Pt currently denies SI. Sitter at bedside for safety.

## 2013-11-01 ENCOUNTER — Encounter (HOSPITAL_COMMUNITY): Payer: Self-pay | Admitting: Registered Nurse

## 2013-11-01 DIAGNOSIS — F321 Major depressive disorder, single episode, moderate: Secondary | ICD-10-CM

## 2013-11-01 DIAGNOSIS — F259 Schizoaffective disorder, unspecified: Secondary | ICD-10-CM

## 2013-11-01 DIAGNOSIS — R45851 Suicidal ideations: Secondary | ICD-10-CM

## 2013-11-01 NOTE — ED Notes (Signed)
Report given to Latricia, RN in psych ED and pt ambulated to room 43. Belongings given to psych ED tech.
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
## 2013-11-01 NOTE — ED Notes (Signed)
Patient ate 75% of her breakfast

## 2013-11-01 NOTE — ED Notes (Signed)
Patient ate 100% of her dinner

## 2013-11-01 NOTE — ED Notes (Signed)
Patient asleep at this time.

## 2013-11-01 NOTE — Progress Notes (Signed)
CSW attempted to contact the patient's group home, [facility] at [phone].  The number was not in service and no message was able to be left.  CSW contacted the patient's mother who gave an alternate contact number of [phone].  CSW attempted this number that went to voicemail and left a voicemail message with this CSW's contact information at the [facility].  Social Work service will continue to attempt contact [employer].  [place] [facility]

## 2013-11-01 NOTE — ED Notes (Signed)
Pt transferred from [place], presents with SI plan to hurt self with razor or scissors.  Feeling hopeless, hearing voices telling her to kill herself, pt willing to contract for safety.  Denies street drugs or alcohol abuse.  Pt reports diagnosed with Schizoaffective DO, Bipolar DO, Asperger, Borderline Personality.  Pt reports she has attempted SI in the past by taking pills and cleaner fluid.  Pt calm & cooperative at present.

## 2013-11-02 DIAGNOSIS — R45851 Suicidal ideations: Secondary | ICD-10-CM | POA: Diagnosis not present

## 2013-11-02 DIAGNOSIS — F603 Borderline personality disorder: Secondary | ICD-10-CM | POA: Diagnosis not present

## 2013-11-02 NOTE — ED Notes (Signed)
Call from [facility], when she got up front with the patient she was told that the group home worker left.  We had told them that patient would be right out.  Worker said to put the patient in a cab and she would pay for it when the cab arrived.  Attempted to call and verify with them, but call went to voice mail.  Security called [employer] and asked them to pick the patient up and relayed the information that someone at the accepting address would pay for the ride.  They agreed to the conditions and stated they would transport the patient home.

## 2013-11-02 NOTE — Discharge Instructions (Signed)
Depression Depression refers to feeling sad, low, down in the dumps, blue, gloomy, or empty. In general, there are two kinds of depression: 1. Normal sadness or normal grief. This kind of depression is one that we all feel from time to time after upsetting life experiences, such as the loss of a job or the ending of a relationship. This kind of depression is considered normal, is short lived, and resolves within a few days to 2 weeks. Depression experienced after the loss of a loved one (bereavement) often lasts longer than 2 weeks but normally gets better with time. 2. Clinical depression. This kind of depression lasts longer than normal sadness or normal grief or interferes with your ability to function at home, at work, and in school. It also interferes with your personal relationships. It affects almost every aspect of your life. Clinical depression is an illness. Symptoms of depression can also be caused by conditions other than those mentioned above, such as:  Physical illness. Some physical illnesses, including underactive thyroid gland (hypothyroidism), severe anemia, specific types of cancer, diabetes, uncontrolled seizures, heart and lung problems, strokes, and chronic pain are commonly associated with symptoms of depression.  Side effects of some prescription medicine. In some people, certain types of medicine can cause symptoms of depression.  Substance abuse. Abuse of alcohol and illicit drugs can cause symptoms of depression. SYMPTOMS Symptoms of normal sadness and normal grief include the following:  Feeling sad or crying for short periods of time.  Not caring about anything (apathy).  Difficulty sleeping or sleeping too much.  No longer able to enjoy the things you used to enjoy.  Desire to be by oneself all the time (social isolation).  Lack of energy or motivation.  Difficulty concentrating or remembering.  Change in appetite or weight.  Restlessness or  agitation. Symptoms of clinical depression include the same symptoms of normal sadness or normal grief and also the following symptoms:  Feeling sad or crying all the time.  Feelings of guilt or worthlessness.  Feelings of hopelessness or helplessness.  Thoughts of suicide or the desire to harm yourself (suicidal ideation).  Loss of touch with reality (psychotic symptoms). Seeing or hearing things that are not real (hallucinations) or having false beliefs about your life or the people around you (delusions and paranoia). DIAGNOSIS  The diagnosis of clinical depression is usually based on how bad the symptoms are and how long they have lasted. Your health care provider will also ask you questions about your medical history and substance use to find out if physical illness, use of prescription medicine, or substance abuse is causing your depression. Your health care provider may also order blood tests. TREATMENT  Often, normal sadness and normal grief do not require treatment. However, sometimes antidepressant medicine is given for bereavement to ease the depressive symptoms until they resolve. The treatment for clinical depression depends on how bad the symptoms are but often includes antidepressant medicine, counseling with a mental health professional, or both. Your health care provider will help to determine what treatment is best for you. Depression caused by physical illness usually goes away with appropriate medical treatment of the illness. If prescription medicine is causing depression, talk with your health care provider about stopping the medicine, decreasing the dose, or changing to another medicine. Depression caused by the abuse of alcohol or illicit drugs goes away when you stop using these substances. Some adults need professional help in order to stop drinking or using drugs. SEEK IMMEDIATE MEDICAL   CARE IF:  You have thoughts about hurting yourself or others.  You lose touch  with reality (have psychotic symptoms).  You are taking medicine for depression and have a serious side effect. FOR MORE INFORMATION  National Alliance on Mental Illness: www.nami.org  National Institute of Mental Health: www.nimh.nih.gov Document Released: 01/20/2000 Document Revised: 06/08/2013 Document Reviewed: 04/23/2011 Exit[employer] Patient Information 2015 [employer]. This information is not intended to replace advice given to you by your health care provider. Make sure you discuss any questions you have with your health care provider.  

## 2013-11-02 NOTE — [facility] Suicide Risk Assessment (Signed)
   Demographic Factors:  Caucasian, Unemployed and female, lives in a group home  Total Time spent with patient: 20 minutes    Psychiatric Specialty Exam: Physical Exam  ROS  Blood pressure 120/62, pulse 80, temperature 98.6 F (37 C), temperature source Oral, resp. rate 18, last menstrual period 10/03/2013, SpO2 100.00%.There is no weight on file to calculate BMI.  General Appearance: Casual  Eye Contact::  Good  Speech:  Clear and Coherent  Volume:  Normal  Mood:  Dysphoric  Affect:  Appropriate  Thought Process:  Circumstantial  Orientation:  Full (Time, Place, and Person)  Thought Content:  Rumination  Suicidal Thoughts:  No  Homicidal Thoughts:  No  Memory:  Immediate;   Good Recent;   Good Remote;   Good  Judgement:  Fair  Insight:  Shallow  Psychomotor Activity:  Normal  Concentration:  Fair  Recall:  Good  Fund of Knowledge:Fair  Language: Fair  Akathisia:  No  Handed:  Right  AIMS (if indicated):     Assets:  Communication Skills Desire for Improvement Physical Health  Sleep:   good    Musculoskeletal: Strength & Muscle Tone: within normal limits Gait & Station: normal Patient leans: N/A   Mental Status Per Nursing Assessment::   On Admission:     Current Mental Status by Physician: Patient denies suicidal ideations, intent ot plan  Loss Factors: Financial problems/change in socioeconomic status  Historical Factors: Prior suicide attempts and poor impulse control  Risk Reduction Factors:   Positive social support and Positive therapeutic relationship  Continued Clinical Symptoms:  Resolving depressive symptoms  Cognitive Features That Contribute To Risk:  Closed-mindedness Polarized thinking    Suicide Risk:  Minimal: No identifiable suicidal ideation.  Patients presenting with no risk factors but with morbid ruminations; may be classified as minimal risk based on the severity of the depressive symptoms  Discharge Diagnoses:   AXIS  I:  Schizoaffective disorder  AXIS II:  Borderline Personality Dis. AXIS III:   Past Medical History  Diagnosis Date  . Asthma   . Depression   . Schizoaffective disorder, bipolar type   . Autism   . Essential tremor   . Chronic constipation   . Acid reflux    AXIS IV:  other psychosocial or environmental problems and problems related to social environment AXIS V:  61-70 mild symptoms  Plan Of Care/Follow-up recommendations:  Activity:  as tolerated Diet:  healthy  Is patient on multiple antipsychotic therapies at discharge:  No   Has Patient had three or more failed trials of antipsychotic monotherapy by history:  No  Recommended Plan for Multiple Antipsychotic Therapies: NA    [name], MD 11/02/2013, 11:21 AM
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
## 2013-11-02 NOTE — ED Notes (Signed)
Patient discharged to home with group home staff.  Left the unit ambulatory.  All belongings returned to patient.

## 2013-11-02 NOTE — ED Notes (Signed)
[place] that patient has left to go to group home in [facility].

## 2013-11-02 NOTE — BHH Counselor (Signed)
TC to [facility] Olivia Canter. Writer informed Ms Juleen China that pt has been psychiatrically cleared and ready for d/c. Ms. Juleen China sts someone will be at [facility] after 6:30 pm for pick up. Writer then notified Jackie Plum via voicemail.   Arnold Long, [place] Assessment Counselor  TC from pt's guardian representative of [facility]. Lisabeth Devoid asks if pt can be kept in an acute unit until she can find other housing for pt. Lisabeth Devoid sts that group home [facility] refuses to take her back.   Arnold Long, [place] Assessment Counselor

## 2013-11-02 NOTE — Progress Notes (Signed)
  CARE MANAGEMENT ED NOTE 11/02/2013  Patient:  [name], [name]   Account Number:  [id number]  Date Initiated:  11/02/2013  Documentation initiated by:  Jackelyn Poling  Subjective/Objective Assessment:   23 yr old medicare/medicaid of [place] pt  Depression, inability to get along with the staff at her group home states "tomorrow is my birthday and I want to kill myself, I was going to cut my neck, but I cut my arm instead." Pt has numerous     Subjective/Objective Assessment Detail:   superficial lacerations noted to the left forearm. Pt states that her family is in [place], her fiance is in [place], and that she was discharge to a group home in [place] after being seen at [place]    dx schizoaffective disorder borderline personality disorder     Action/Plan:   CM transferred call to TTS from Jac Canavan   Action/Plan Detail:   Anticipated DC Date:       Status Recommendation to Physician:   Result of Recommendation:    Other ED Services  Consult Working Plan   In-house referral  Clinical Social Worker   DC Forensic scientist  Other  Outpatient Services - Pt will follow up    Choice offered to / List presented to:            Status of service:  Completed, signed off  ED Comments:   ED Comments Detail:

## 2013-11-02 NOTE — ED Provider Notes (Signed)
History/physical exam/procedure(s) were performed by non-physician practitioner and as supervising physician I was immediately available for consultation/collaboration. I have reviewed all notes and am in agreement with care and plan.   Shaune Pollack, MD 11/02/13 1500

## 2013-11-04 LAB — CLOZAPINE (CLOZARIL)
Clozapine Lvl: 403 mcg/L
NORCLOZAPINE: 217 ug/L (ref 25–400)

## 2013-11-09 DIAGNOSIS — R109 Unspecified abdominal pain: Secondary | ICD-10-CM | POA: Diagnosis not present

## 2013-11-09 DIAGNOSIS — K219 Gastro-esophageal reflux disease without esophagitis: Secondary | ICD-10-CM | POA: Diagnosis not present

## 2013-11-09 DIAGNOSIS — R1011 Right upper quadrant pain: Secondary | ICD-10-CM | POA: Diagnosis not present

## 2013-11-09 DIAGNOSIS — Z79899 Other long term (current) drug therapy: Secondary | ICD-10-CM | POA: Diagnosis not present

## 2013-11-09 DIAGNOSIS — Z87891 Personal history of nicotine dependence: Secondary | ICD-10-CM | POA: Diagnosis not present

## 2013-11-09 DIAGNOSIS — F329 Major depressive disorder, single episode, unspecified: Secondary | ICD-10-CM | POA: Diagnosis not present

## 2013-11-09 DIAGNOSIS — R1013 Epigastric pain: Secondary | ICD-10-CM | POA: Diagnosis not present

## 2013-11-11 DIAGNOSIS — R2242 Localized swelling, mass and lump, left lower limb: Secondary | ICD-10-CM | POA: Diagnosis not present

## 2013-11-11 DIAGNOSIS — R002 Palpitations: Secondary | ICD-10-CM | POA: Diagnosis not present

## 2013-11-11 DIAGNOSIS — S90812A Abrasion, left foot, initial encounter: Secondary | ICD-10-CM | POA: Diagnosis not present

## 2013-11-11 DIAGNOSIS — R069 Unspecified abnormalities of breathing: Secondary | ICD-10-CM | POA: Diagnosis not present

## 2013-11-11 DIAGNOSIS — R000 Tachycardia, unspecified: Secondary | ICD-10-CM | POA: Diagnosis not present

## 2013-11-26 DIAGNOSIS — Z79899 Other long term (current) drug therapy: Secondary | ICD-10-CM | POA: Diagnosis not present

## 2013-11-27 ENCOUNTER — Emergency Department (HOSPITAL_COMMUNITY)
Admission: EM | Admit: 2013-11-27 | Discharge: 2013-11-27 | Disposition: A | Discharge status: Home / Self Care | Payer: Medicare Other | Attending: Emergency Medicine | Admitting: Emergency Medicine

## 2013-11-27 ENCOUNTER — Encounter (HOSPITAL_COMMUNITY): Payer: Self-pay | Admitting: Emergency Medicine

## 2013-11-27 DIAGNOSIS — J069 Acute upper respiratory infection, unspecified: Secondary | ICD-10-CM | POA: Diagnosis not present

## 2013-11-27 DIAGNOSIS — F329 Major depressive disorder, single episode, unspecified: Secondary | ICD-10-CM | POA: Insufficient documentation

## 2013-11-27 DIAGNOSIS — K219 Gastro-esophageal reflux disease without esophagitis: Secondary | ICD-10-CM | POA: Diagnosis not present

## 2013-11-27 DIAGNOSIS — R51 Headache: Secondary | ICD-10-CM | POA: Diagnosis not present

## 2013-11-27 DIAGNOSIS — F840 Autistic disorder: Secondary | ICD-10-CM | POA: Insufficient documentation

## 2013-11-27 DIAGNOSIS — J45909 Unspecified asthma, uncomplicated: Secondary | ICD-10-CM | POA: Insufficient documentation

## 2013-11-27 DIAGNOSIS — K5900 Constipation, unspecified: Secondary | ICD-10-CM | POA: Insufficient documentation

## 2013-11-27 DIAGNOSIS — F250 Schizoaffective disorder, bipolar type: Secondary | ICD-10-CM | POA: Diagnosis not present

## 2013-11-27 DIAGNOSIS — J029 Acute pharyngitis, unspecified: Secondary | ICD-10-CM | POA: Insufficient documentation

## 2013-11-27 DIAGNOSIS — Z8669 Personal history of other diseases of the nervous system and sense organs: Secondary | ICD-10-CM | POA: Insufficient documentation

## 2013-11-27 DIAGNOSIS — R0981 Nasal congestion: Secondary | ICD-10-CM | POA: Diagnosis present

## 2013-11-27 DIAGNOSIS — Z79899 Other long term (current) drug therapy: Secondary | ICD-10-CM | POA: Diagnosis not present

## 2013-11-27 DIAGNOSIS — R031 Nonspecific low blood-pressure reading: Secondary | ICD-10-CM | POA: Diagnosis not present

## 2013-11-27 LAB — RAPID STREP SCREEN (MED CTR MEBANE ONLY): Streptococcus, Group A Screen (Direct): NEGATIVE

## 2013-11-27 MED ORDER — FLUTICASONE PROPIONATE 50 MCG/ACT NA SUSP
2.0000 | Freq: Every day | NASAL | Status: DC
  Administered 2013-11-27: 2 via NASAL
  Filled 2013-11-27: qty 16

## 2013-11-27 MED ORDER — IBUPROFEN 800 MG PO TABS
800.0000 mg | ORAL_TABLET | Freq: Once | ORAL | Status: AC
  Administered 2013-11-27: 800 mg via ORAL
  Filled 2013-11-27: qty 1

## 2013-11-27 MED ORDER — IBUPROFEN 600 MG PO TABS: 600.0000 mg | ORAL_TABLET | Freq: Four times a day (QID) | ORAL | Status: DC | PRN

## 2013-11-27 MED ORDER — GI COCKTAIL ~~LOC~~
30.0000 mL | Freq: Once | ORAL | Status: AC
  Administered 2013-11-27: 30 mL via ORAL
  Filled 2013-11-27: qty 30

## 2013-11-27 MED ORDER — MAGIC MOUTHWASH W/LIDOCAINE: 5.0000 mL | Freq: Four times a day (QID) | ORAL | Status: DC | PRN

## 2013-11-27 NOTE — ED Provider Notes (Signed)
CSN: [id number]     Arrival date & time 11/27/13  2035 History   First MD Initiated Contact with Patient 11/27/13 2200     This chart was scribed for non-physician practitioner, Antonietta Breach, PA-C working with Dr. Sherwood Gambler by Forrestine Him, ED Scribe. This patient was seen in [facility] and the patient's care was started at 7:36 AM.   Chief Complaint  Patient presents with  . URI   The history is provided by the patient. No language interpreter was used.    HPI Comments: [name] is a 23 y.o. female with a PMHx of asthma, depression, and schizoaffective disorder who presents to the Emergency Department complaining of constant, moderate sore throat x 2-3 days. Pt also reports ongoing nasal congestions, pain with swallowing, dizziness (room spinning), and HA. She has not tried any OTC medications to help manage symptoms. Pt denies any cough, vomiting, or fever. She is drinking as normal. Roommate is currently sick with similar symptoms. She was sick prior to onset of pts symptoms. Pt with known allergies to multiple medications as listed below. No other concerns this visit.  Past Medical History  Diagnosis Date  . Asthma   . Depression   . Schizoaffective disorder, bipolar type   . Autism   . Essential tremor   . Chronic constipation   . Acid reflux    Past Surgical History  Procedure Laterality Date  . Mouth surgery     No family history on file. History  Substance Use Topics  . Smoking status: Never Smoker   . Smokeless tobacco: Not on file  . Alcohol Use: No   OB History    No data available      Review of Systems  Constitutional: Negative for fever and chills.  HENT: Positive for congestion and sore throat.   Respiratory: Negative for cough.   Gastrointestinal: Negative for nausea.  Neurological: Positive for dizziness and headaches.  All other systems reviewed and are negative.   Allergies  Coconut flavor; Geodon; Haloperidol and related; Lithium;  Oxycodone; and Prilosec  Home Medications   Prior to Admission medications   Medication Sig Start Date End Date Taking? Authorizing Provider  cloZAPine (CLOZARIL) 100 MG tablet Take 100 mg by mouth 2 (two) times daily.   Yes Historical Provider, MD  ferrous sulfate 325 (65 FE) MG tablet Take 325 mg by mouth every evening.    Yes Historical Provider, MD  FLUoxetine (PROZAC) 40 MG capsule Take 40 mg by mouth every morning.    Yes Historical Provider, MD  metoprolol tartrate (LOPRESSOR) 25 MG tablet Take 25 mg by mouth 2 (two) times daily.   Yes Historical Provider, MD  pantoprazole (PROTONIX) 40 MG tablet Take 40 mg by mouth every morning.    Yes Historical Provider, MD  ranitidine (ZANTAC) 150 MG tablet Take 150 mg by mouth 2 (two) times daily.   Yes Historical Provider, MD  senna (SENOKOT) 8.6 MG TABS tablet Take 1 tablet by mouth 2 (two) times daily.   Yes Historical Provider, MD  vitamin C (ASCORBIC ACID) 500 MG tablet Take 500 mg by mouth every morning.    Yes Historical Provider, MD  Alum & Mag Hydroxide-Simeth (MAGIC MOUTHWASH W/LIDOCAINE) SOLN Take 5 mLs by mouth 4 (four) times daily as needed for mouth pain. 11/27/13   Antonietta Breach, PA-C  ibuprofen (ADVIL,MOTRIN) 600 MG tablet Take 1 tablet (600 mg total) by mouth every 6 (six) hours as needed. 11/27/13   Antonietta Breach,  PA-C   Triage Vitals: BP 91/54 mmHg  Pulse 102  Temp(Src) 97.4 F (36.3 C) (Oral)  Resp 20  Ht 5\' 5"  (1.651 m)  Wt 180 lb (81.647 kg)  BMI 29.95 kg/m2  SpO2 99%  LMP 11/06/2013   Physical Exam  Constitutional: She is oriented to person, place, and time. She appears well-developed and well-nourished. No distress.  Nontoxic/nonseptic appearing  HENT:  Head: Normocephalic and atraumatic.  Right Ear: Hearing, tympanic membrane, external ear and ear canal normal.  Left Ear: Hearing, tympanic membrane, external ear and ear canal normal.  Nose: Right sinus exhibits no maxillary sinus tenderness and no frontal sinus  tenderness. Left sinus exhibits no maxillary sinus tenderness and no frontal sinus tenderness.  Mouth/Throat: No oropharyngeal exudate.  Audible nasal congestion. Mild oropharyngeal erythema without exudates. Patient tolerating secretions without difficulty.  Eyes: Conjunctivae and EOM are normal. Pupils are equal, round, and reactive to light. No scleral icterus.  Neck: Normal range of motion. Neck supple.  No nuchal rigidity or meningismus.  Cardiovascular: Normal rate, regular rhythm and normal heart sounds.   Pulmonary/Chest: Effort normal. No respiratory distress. She has no wheezes. She has no rales.  Chest expansion symmetric. No tachypnea or dyspnea. No wheezing or rales.  Musculoskeletal: Normal range of motion.  Neurological: She is alert and oriented to person, place, and time. She exhibits normal muscle tone. Coordination normal.  GCS 15. Patient moves extremities without ataxia. She ambulates with normal gait.  Skin: Skin is warm and dry. No rash noted. She is not diaphoretic. No erythema. No pallor.  Psychiatric: She has a normal mood and affect. Her behavior is normal.  Nursing note and vitals reviewed.   ED Course  Procedures (including critical care time)  DIAGNOSTIC STUDIES: Oxygen Saturation is 99% on RA, Normal by my interpretation.    COORDINATION OF CARE: 7:36 AM- Will order rapid strep screen and culture group A strep. Will give Flonase, Ibuprofen, and GI cocktail here in ED. Discussed treatment plan with pt at bedside and pt agreed to plan.     Labs Review Labs Reviewed  RAPID STREP SCREEN  CULTURE, GROUP A STREP    Imaging Review No results found.   EKG Interpretation None      MDM   Final diagnoses:  Viral URI  Pharyngitis    23 year old female presents to the emergency department for further evaluation of upper respiratory symptoms and sore throat. Patient presents with her roommate who is sick with similar symptoms. Patient denies associated  fever. She has been afebrile since her arrival in the ED. Physical exam findings consistent with viral URI. Uvula midline and patient tolerating secretions that difficulty. Oropharynx is clear with mild posterior oropharyngeal erythema. Rapid strep screen negative. Patient to be managed as outpatient with Flonase, Advil, and Magic mouthwash. Advised primary care follow-up to ensure resolution of symptoms. Return precautions provided. Patient agreeable to plan with no unaddressed concerns.  I personally performed the services described in this documentation, which was scribed in my presence. The recorded information has been reviewed and is accurate.    Filed Vitals:   11/27/13 2102 11/27/13 2116  BP: 91/54   Pulse: 102   Temp: 97.4 F (36.3 C)   TempSrc: Oral   Resp: 20   Height:  5\' 5"  (1.651 m)  Weight:  180 lb (81.647 kg)  SpO2: 99%      Antonietta Breach, PA-C 12/07/13 [phone]

## 2013-11-27 NOTE — Discharge Instructions (Signed)
Upper Respiratory Infection, Adult An upper respiratory infection (URI) is also known as the common cold. It is often caused by a type of germ (virus). Colds are easily spread (contagious). You can pass it to others by kissing, coughing, sneezing, or drinking out of the same glass. Usually, you get better in 1 or 2 weeks.  HOME CARE   Only take medicine as told by your doctor.  Use a warm mist humidifier or breathe in steam from a hot shower.  Drink enough water and fluids to keep your pee (urine) clear or pale yellow.  Get plenty of rest.  Return to work when your temperature is back to normal or as told by your doctor. You may use a face mask and wash your hands to stop your cold from spreading. GET HELP RIGHT AWAY IF:   After the first few days, you feel you are getting worse.  You have questions about your medicine.  You have chills, shortness of breath, or brown or red spit (mucus).  You have yellow or brown snot (nasal discharge) or pain in the face, especially when you bend forward.  You have a fever, puffy (swollen) neck, pain when you swallow, or white spots in the back of your throat.  You have a bad headache, ear pain, sinus pain, or chest pain.  You have a high-pitched whistling sound when you breathe in and out (wheezing).  You have a lasting cough or cough up blood.  You have sore muscles or a stiff neck. MAKE SURE YOU:   Understand these instructions.  Will watch your condition.  Will get help right away if you are not doing well or get worse. Document Released: 07/11/2007 Document Revised: 04/16/2011 Document Reviewed: 04/29/2013 [facility] Patient Information 2015 [place], [place]. This information is not intended to replace advice given to you by your health care provider. Make sure you discuss any questions you have with your health care provider. Pharyngitis Pharyngitis is redness, pain, and swelling (inflammation) of your pharynx.  CAUSES  Pharyngitis is  usually caused by infection. Most of the time, these infections are from viruses (viral) and are part of a cold. However, sometimes pharyngitis is caused by bacteria (bacterial). Pharyngitis can also be caused by allergies. Viral pharyngitis may be spread from person to person by coughing, sneezing, and personal items or utensils (cups, forks, spoons, toothbrushes). Bacterial pharyngitis may be spread from person to person by more intimate contact, such as kissing.  SIGNS AND SYMPTOMS  Symptoms of pharyngitis include:   Sore throat.   Tiredness (fatigue).   Low-grade fever.   Headache.  Joint pain and muscle aches.  Skin rashes.  Swollen lymph nodes.  Plaque-like film on throat or tonsils (often seen with bacterial pharyngitis). DIAGNOSIS  Your health care provider will ask you questions about your illness and your symptoms. Your medical history, along with a physical exam, is often all that is needed to diagnose pharyngitis. Sometimes, a rapid strep test is done. Other lab tests may also be done, depending on the suspected cause.  TREATMENT  Viral pharyngitis will usually get better in 3-4 days without the use of medicine. Bacterial pharyngitis is treated with medicines that kill germs (antibiotics).  HOME CARE INSTRUCTIONS   Drink enough water and fluids to keep your urine clear or pale yellow.   Only take over-the-counter or prescription medicines as directed by your health care provider:   If you are prescribed antibiotics, make sure you finish them even if you start  to feel better.   Do not take aspirin.   Get lots of rest.   Gargle with 8 oz of salt water ( tsp of salt per 1 qt of water) as often as every 1-2 hours to soothe your throat.   Throat lozenges (if you are not at risk for choking) or sprays may be used to soothe your throat. SEEK MEDICAL CARE IF:   You have large, tender lumps in your neck.  You have a rash.  You cough up green, yellow-brown, or  bloody spit. SEEK IMMEDIATE MEDICAL CARE IF:   Your neck becomes stiff.  You drool or are unable to swallow liquids.  You vomit or are unable to keep medicines or liquids down.  You have severe pain that does not go away with the use of recommended medicines.  You have trouble breathing (not caused by a stuffy nose). MAKE SURE YOU:   Understand these instructions.  Will watch your condition.  Will get help right away if you are not doing well or get worse. Document Released: 01/22/2005 Document Revised: 11/12/2012 Document Reviewed: 09/29/2012 [facility] Patient Information 2015 [place], [place]. This information is not intended to replace advice given to you by your health care provider. Make sure you discuss any questions you have with your health care provider. Salt Water Gargle This solution will help make your mouth and throat feel better. HOME CARE INSTRUCTIONS   Mix 1 teaspoon of salt in 8 ounces of warm water.  Gargle with this solution as much or often as you need or as directed. Swish and gargle gently if you have any sores or wounds in your mouth.  Do not swallow this mixture. Document Released: 10/27/2003 Document Revised: 04/16/2011 Document Reviewed: 03/19/2008 [facility] Patient Information 2015 [place], [place]. This information is not intended to replace advice given to you by your health care provider. Make sure you discuss any questions you have with your health care provider.

## 2013-11-27 NOTE — ED Notes (Signed)
Pt c/o nasal congestion, cough, sore throat, painful swallowing. Pt states she was exposed to strep last week.

## 2013-11-29 LAB — CULTURE, GROUP A STREP

## 2013-11-30 ENCOUNTER — Telehealth (HOSPITAL_BASED_OUTPATIENT_CLINIC_OR_DEPARTMENT_OTHER): Payer: Self-pay | Admitting: Emergency Medicine

## 2013-12-07 DIAGNOSIS — F250 Schizoaffective disorder, bipolar type: Secondary | ICD-10-CM | POA: Diagnosis not present

## 2013-12-14 DIAGNOSIS — F250 Schizoaffective disorder, bipolar type: Secondary | ICD-10-CM | POA: Diagnosis not present

## 2013-12-28 DIAGNOSIS — Z79899 Other long term (current) drug therapy: Secondary | ICD-10-CM | POA: Diagnosis not present

## 2014-01-04 DIAGNOSIS — F250 Schizoaffective disorder, bipolar type: Secondary | ICD-10-CM | POA: Diagnosis not present

## 2014-01-07 DIAGNOSIS — R000 Tachycardia, unspecified: Secondary | ICD-10-CM | POA: Diagnosis not present

## 2014-01-07 DIAGNOSIS — F259 Schizoaffective disorder, unspecified: Secondary | ICD-10-CM | POA: Diagnosis not present

## 2014-01-07 DIAGNOSIS — G44209 Tension-type headache, unspecified, not intractable: Secondary | ICD-10-CM | POA: Diagnosis not present

## 2014-01-07 DIAGNOSIS — K210 Gastro-esophageal reflux disease with esophagitis: Secondary | ICD-10-CM | POA: Diagnosis not present

## 2014-01-08 ENCOUNTER — Emergency Department (HOSPITAL_COMMUNITY)
Admission: EM | Admit: 2014-01-08 | Discharge: 2014-01-09 | Disposition: A | Discharge status: Home / Self Care | Payer: Medicare Other | Attending: Emergency Medicine | Admitting: Emergency Medicine

## 2014-01-08 ENCOUNTER — Encounter (HOSPITAL_COMMUNITY): Payer: Self-pay | Admitting: Emergency Medicine

## 2014-01-08 DIAGNOSIS — K5900 Constipation, unspecified: Secondary | ICD-10-CM | POA: Diagnosis not present

## 2014-01-08 DIAGNOSIS — Z3202 Encounter for pregnancy test, result negative: Secondary | ICD-10-CM | POA: Diagnosis not present

## 2014-01-08 DIAGNOSIS — J45909 Unspecified asthma, uncomplicated: Secondary | ICD-10-CM | POA: Diagnosis not present

## 2014-01-08 DIAGNOSIS — R45851 Suicidal ideations: Secondary | ICD-10-CM

## 2014-01-08 DIAGNOSIS — R451 Restlessness and agitation: Secondary | ICD-10-CM | POA: Diagnosis not present

## 2014-01-08 DIAGNOSIS — Z79899 Other long term (current) drug therapy: Secondary | ICD-10-CM | POA: Diagnosis not present

## 2014-01-08 DIAGNOSIS — K219 Gastro-esophageal reflux disease without esophagitis: Secondary | ICD-10-CM | POA: Insufficient documentation

## 2014-01-08 DIAGNOSIS — F840 Autistic disorder: Secondary | ICD-10-CM | POA: Diagnosis not present

## 2014-01-08 DIAGNOSIS — F603 Borderline personality disorder: Secondary | ICD-10-CM | POA: Diagnosis not present

## 2014-01-08 DIAGNOSIS — G479 Sleep disorder, unspecified: Secondary | ICD-10-CM | POA: Diagnosis not present

## 2014-01-08 DIAGNOSIS — F259 Schizoaffective disorder, unspecified: Secondary | ICD-10-CM | POA: Diagnosis not present

## 2014-01-08 LAB — CBC
HCT: 35.7 % — ABNORMAL LOW (ref 36.0–46.0)
Hemoglobin: 11.5 g/dL — ABNORMAL LOW (ref 12.0–15.0)
MCH: 26.7 pg (ref 26.0–34.0)
MCHC: 32.2 g/dL (ref 30.0–36.0)
MCV: 83 fL (ref 78.0–100.0)
PLATELETS: 284 10*3/uL (ref 150–400)
RBC: 4.3 MIL/uL (ref 3.87–5.11)
RDW: 13 % (ref 11.5–15.5)
WBC: 8.6 10*3/uL (ref 4.0–10.5)

## 2014-01-08 LAB — RAPID URINE DRUG SCREEN, HOSP PERFORMED
AMPHETAMINES: POSITIVE — AB
Barbiturates: NOT DETECTED
Benzodiazepines: NOT DETECTED
COCAINE: NOT DETECTED
Opiates: NOT DETECTED
Tetrahydrocannabinol: NOT DETECTED

## 2014-01-08 LAB — PREGNANCY, URINE: PREG TEST UR: NEGATIVE

## 2014-01-08 MED ORDER — LORAZEPAM 1 MG PO TABS: 1.0000 mg | ORAL_TABLET | Freq: Three times a day (TID) | ORAL | Status: DC | PRN

## 2014-01-08 MED ORDER — ONDANSETRON HCL 4 MG PO TABS: 4.0000 mg | ORAL_TABLET | Freq: Three times a day (TID) | ORAL | Status: DC | PRN

## 2014-01-08 MED ORDER — ZOLPIDEM TARTRATE 5 MG PO TABS: 5.0000 mg | ORAL_TABLET | Freq: Every evening | ORAL | Status: DC | PRN

## 2014-01-08 MED ORDER — IBUPROFEN 200 MG PO TABS: 600.0000 mg | ORAL_TABLET | Freq: Three times a day (TID) | ORAL | Status: DC | PRN

## 2014-01-08 MED ORDER — ACETAMINOPHEN 325 MG PO TABS: 650.0000 mg | ORAL_TABLET | ORAL | Status: DC | PRN

## 2014-01-08 MED ORDER — ALUM & MAG HYDROXIDE-SIMETH 200-200-20 MG/5ML PO SUSP: 30.0000 mL | ORAL | Status: DC | PRN
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
## 2014-01-09 DIAGNOSIS — F4310 Post-traumatic stress disorder, unspecified: Secondary | ICD-10-CM | POA: Diagnosis present

## 2014-01-09 DIAGNOSIS — R45850 Homicidal ideations: Secondary | ICD-10-CM | POA: Diagnosis present

## 2014-01-09 DIAGNOSIS — K219 Gastro-esophageal reflux disease without esophagitis: Secondary | ICD-10-CM | POA: Diagnosis present

## 2014-01-09 DIAGNOSIS — R45851 Suicidal ideations: Secondary | ICD-10-CM | POA: Diagnosis not present

## 2014-01-09 DIAGNOSIS — F840 Autistic disorder: Secondary | ICD-10-CM | POA: Diagnosis not present

## 2014-01-09 DIAGNOSIS — Z91410 Personal history of adult physical and sexual abuse: Secondary | ICD-10-CM | POA: Diagnosis not present

## 2014-01-09 DIAGNOSIS — F250 Schizoaffective disorder, bipolar type: Secondary | ICD-10-CM | POA: Diagnosis not present

## 2014-01-09 DIAGNOSIS — G250 Essential tremor: Secondary | ICD-10-CM | POA: Diagnosis present

## 2014-01-09 DIAGNOSIS — D649 Anemia, unspecified: Secondary | ICD-10-CM | POA: Diagnosis present

## 2014-01-09 DIAGNOSIS — F603 Borderline personality disorder: Secondary | ICD-10-CM

## 2014-01-09 DIAGNOSIS — F259 Schizoaffective disorder, unspecified: Secondary | ICD-10-CM

## 2014-01-09 DIAGNOSIS — Z915 Personal history of self-harm: Secondary | ICD-10-CM | POA: Diagnosis not present

## 2014-01-09 DIAGNOSIS — K5090 Crohn's disease, unspecified, without complications: Secondary | ICD-10-CM | POA: Diagnosis not present

## 2014-01-09 DIAGNOSIS — Z590 Homelessness: Secondary | ICD-10-CM | POA: Diagnosis not present

## 2014-01-09 DIAGNOSIS — J45909 Unspecified asthma, uncomplicated: Secondary | ICD-10-CM | POA: Diagnosis present

## 2014-01-09 LAB — COMPREHENSIVE METABOLIC PANEL
ALBUMIN: 3.6 g/dL (ref 3.5–5.2)
ALT: 14 U/L (ref 0–35)
AST: 15 U/L (ref 0–37)
Alkaline Phosphatase: 88 U/L (ref 39–117)
Anion gap: 15 (ref 5–15)
BUN: 8 mg/dL (ref 6–23)
CHLORIDE: 105 meq/L (ref 96–112)
CO2: 19 meq/L (ref 19–32)
Calcium: 9.4 mg/dL (ref 8.4–10.5)
Creatinine, Ser: 0.63 mg/dL (ref 0.50–1.10)
GFR calc Af Amer: >90 mL/min (ref >90)
Glucose, Bld: 100 mg/dL — ABNORMAL HIGH (ref 70–99)
Potassium: 4 mEq/L (ref 3.7–5.3)
Sodium: 139 mEq/L (ref 137–147)
Total Protein: 7.1 g/dL (ref 6.0–8.3)

## 2014-01-09 LAB — SALICYLATE LEVEL: Salicylate Lvl: <2 mg/dL — ABNORMAL LOW (ref 2.8–20.0)

## 2014-01-09 LAB — ACETAMINOPHEN LEVEL: Acetaminophen (Tylenol), Serum: <15 ug/mL (ref 10–30)

## 2014-01-09 LAB — ETHANOL: Alcohol, Ethyl (B): <11 mg/dL (ref 0–11)

## 2014-01-09 MED ORDER — FLUOXETINE HCL 40 MG PO CAPS: 40.0000 mg | ORAL_CAPSULE | Freq: Every morning | ORAL | Status: DC

## 2014-01-09 MED ORDER — METOPROLOL TARTRATE 25 MG PO TABS: 25.0000 mg | ORAL_TABLET | Freq: Two times a day (BID) | ORAL | Status: DC

## 2014-01-09 MED ORDER — TRAZODONE HCL 50 MG PO TABS: 50.0000 mg | ORAL_TABLET | Freq: Every evening | ORAL | Status: DC | PRN

## 2014-01-09 NOTE — ED Notes (Signed)
Called [phone] to request [place] transport to [facility].
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
## 2014-01-09 NOTE — ED Notes (Signed)
Called report to Edison Simon RN at [facility] adult unit, [phone].
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
## 2014-01-09 NOTE — BHH Suicide Risk Assessment (Signed)
Suicide Risk Assessment  Discharge Assessment     Demographic Factors:  Caucasian  Total Time spent with patient: 30 minutes  Psychiatric Specialty Exam:     Blood pressure 121/68, pulse 93, temperature 98.8 F (37.1 C), temperature source Oral, resp. rate 16, last menstrual period 01/07/2014, SpO2 100 %.There is no weight on file to calculate BMI.   General Appearance: Casual and Fairly Groomed  Engineer, water:: Good  Speech: Clear and Coherent  Volume: Normal  Mood: Anxious and Depressed  Affect: Depressed  Thought Process: Coherent  Orientation: Full (Time, Place, and Person)  Thought Content: Rumination  Suicidal Thoughts: Yes. with intent/plan  Homicidal Thoughts: No  Memory: Immediate; Fair Recent; Fair Remote; Fair  Judgement: Good  Insight: Good  Psychomotor Activity: Normal  Concentration: Good  Recall: Good  Akathisia: No  Handed:   AIMS (if indicated):    Assets: Communication Skills Desire for Improvement Resilience  Sleep:       Musculoskeletal: Strength & Muscle Tone: within normal limits Gait & Station: normal Patient leans: N/A   Mental Status Per Nursing Assessment::   On Admission:     Current Mental Status by Physician: Suicide ideation indicated by: Patient  Loss Factors: 9 ATTEMPTS, LIFE STRESSORS  Historical Factors: Prior suicide attempts and Impulsivity  Risk Reduction Factors:   Positive social support  Continued Clinical Symptoms:  Depression:   Anhedonia Impulsivity Insomnia More than one psychiatric diagnosis Unstable or Poor Therapeutic Relationship  Cognitive Features That Contribute To Risk:  Closed-mindedness    Suicide Risk:  Severe:  Frequent, intense, and enduring suicidal ideation, specific plan, no subjective intent, but some objective markers of intent (i.e., choice of lethal method), the method is accessible, some limited preparatory behavior, evidence of  impaired self-control, severe dysphoria/symptomatology, multiple risk factors present, and few if any protective factors, particularly a lack of social support.  Discharge Diagnoses: Axis I: Schizoaffective Disorder and Autism Spectrum, Borderline Personality Disorder  Past Medical History  Diagnosis Date  . Asthma   . Depression   . Schizoaffective disorder, bipolar type   . Autism   . Essential tremor   . Chronic constipation   . Acid reflux     Plan Of Care/Follow-up recommendations:  Other:  As tolerated, transferring  Is patient on multiple antipsychotic therapies at discharge:  No   Has Patient had three or more failed trials of antipsychotic monotherapy by history:  No  Recommended Plan for Multiple Antipsychotic Therapies: NA   Benjamine Mola, FNP-BC 01/09/2014, 4:39 PM

## 2014-01-09 NOTE — BH Assessment (Signed)
Lavell Luster, [facility], confirmed adult unit is currently at capacity. Contacted the following facilities for placement:  BED AVAILABLE, FAXED CLINICAL INFORMATION: [facility], per Brien Mates, per [facility], per [facility], per [facility], per [facility], per [facility]  AT CAPACITY: [facility], per [facility], per [facility], per [facility], per [facility], per [facility], per [facility], per [facility], per [facility], per [facility], per [facility], per [employer] [facility] [employer], per Junita Push  NO RESPONSE: [facility] [address] [place], [facility] Triage Specialist [phone]

## 2014-01-09 NOTE — BH Assessment (Signed)
[place] Assessment Progress Note    Received call from [place] at [facility] stating pt declined there due to acuity at 0940.  Shaune Pascal, MS, [facility] Licensed Professional Counselor Therapeutic Triage Specialist [facility] Phone: [phone] Fax: [phone]
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
## 2014-01-09 NOTE — BH Assessment (Signed)
Assessment completed. Consulted Chapman Fitch, PA-C who recommend inpatient treatment. Pt has been declined at [facility]. TTS will contact other facilities for placement. Brent General, PA-C has been informed of the recommendation.

## 2014-01-09 NOTE — BH Assessment (Signed)
Received call from [facility] saying Pt is declined.  Orpah Greek Rosana Hoes, [facility] Triage Specialist [phone]

## 2014-03-04 ENCOUNTER — Emergency Department (HOSPITAL_COMMUNITY)
Admission: EM | Admit: 2014-03-04 | Discharge: 2014-03-05 | Disposition: A | Discharge status: Home / Self Care | Payer: No Typology Code available for payment source | Attending: Emergency Medicine | Admitting: Emergency Medicine

## 2014-03-04 ENCOUNTER — Encounter (HOSPITAL_COMMUNITY): Payer: Self-pay | Admitting: *Deleted

## 2014-03-04 DIAGNOSIS — Y998 Other external cause status: Secondary | ICD-10-CM | POA: Insufficient documentation

## 2014-03-04 DIAGNOSIS — R1030 Lower abdominal pain, unspecified: Secondary | ICD-10-CM | POA: Diagnosis not present

## 2014-03-04 DIAGNOSIS — F250 Schizoaffective disorder, bipolar type: Secondary | ICD-10-CM | POA: Insufficient documentation

## 2014-03-04 DIAGNOSIS — R110 Nausea: Secondary | ICD-10-CM | POA: Insufficient documentation

## 2014-03-04 DIAGNOSIS — J45909 Unspecified asthma, uncomplicated: Secondary | ICD-10-CM | POA: Diagnosis not present

## 2014-03-04 DIAGNOSIS — T7421XA Adult sexual abuse, confirmed, initial encounter: Secondary | ICD-10-CM | POA: Diagnosis not present

## 2014-03-04 DIAGNOSIS — Y92009 Unspecified place in unspecified non-institutional (private) residence as the place of occurrence of the external cause: Secondary | ICD-10-CM | POA: Insufficient documentation

## 2014-03-04 DIAGNOSIS — Z3202 Encounter for pregnancy test, result negative: Secondary | ICD-10-CM | POA: Diagnosis not present

## 2014-03-04 DIAGNOSIS — Y9389 Activity, other specified: Secondary | ICD-10-CM | POA: Insufficient documentation

## 2014-03-04 DIAGNOSIS — F840 Autistic disorder: Secondary | ICD-10-CM | POA: Diagnosis not present

## 2014-03-04 DIAGNOSIS — Z8669 Personal history of other diseases of the nervous system and sense organs: Secondary | ICD-10-CM | POA: Insufficient documentation

## 2014-03-04 DIAGNOSIS — Z79899 Other long term (current) drug therapy: Secondary | ICD-10-CM | POA: Diagnosis not present

## 2014-03-04 DIAGNOSIS — Z8719 Personal history of other diseases of the digestive system: Secondary | ICD-10-CM | POA: Diagnosis not present

## 2014-03-04 DIAGNOSIS — T7691XA Unspecified adult maltreatment, suspected, initial encounter: Secondary | ICD-10-CM | POA: Diagnosis not present
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
## 2014-03-05 MED ORDER — AZITHROMYCIN 1 G PO PACK
1.0000 g | PACK | Freq: Once | ORAL | Status: AC
  Administered 2014-03-05: 1 g via ORAL
  Filled 2014-03-05: qty 1

## 2014-03-05 MED ORDER — CEFIXIME 400 MG PO TABS
400.0000 mg | ORAL_TABLET | Freq: Once | ORAL | Status: AC
  Administered 2014-03-05: 400 mg via ORAL
  Filled 2014-03-05: qty 1

## 2014-03-05 MED ORDER — PROMETHAZINE HCL 25 MG PO TABS
25.0000 mg | ORAL_TABLET | Freq: Four times a day (QID) | ORAL | Status: DC | PRN
  Administered 2014-03-05: 25 mg via ORAL
  Filled 2014-03-05: qty 1

## 2014-03-05 MED ORDER — METRONIDAZOLE 500 MG PO TABS
2000.0000 mg | ORAL_TABLET | Freq: Once | ORAL | Status: AC
  Administered 2014-03-05: 2000 mg via ORAL
  Filled 2014-03-05: qty 4

## 2014-03-05 MED ORDER — CIPROFLOXACIN HCL 500 MG PO TABS
500.0000 mg | ORAL_TABLET | Freq: Once | ORAL | Status: AC
  Administered 2014-03-05: 500 mg via ORAL
  Filled 2014-03-05: qty 1

## 2014-03-05 MED ORDER — ULIPRISTAL ACETATE 30 MG PO TABS
30.0000 mg | ORAL_TABLET | Freq: Once | ORAL | Status: AC
  Administered 2014-03-05: 30 mg via ORAL
  Filled 2014-03-05: qty 1
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
## 2014-03-07 ENCOUNTER — Encounter (HOSPITAL_COMMUNITY): Payer: Self-pay | Admitting: Emergency Medicine

## 2014-03-07 ENCOUNTER — Emergency Department (EMERGENCY_DEPARTMENT_HOSPITAL)
Admission: EM | Admit: 2014-03-07 | Discharge: 2014-03-09 | Disposition: A | Discharge status: Other Acute Inpatient Hospital | Payer: Medicare Other | Source: Home / Self Care | Attending: Emergency Medicine | Admitting: Emergency Medicine

## 2014-03-07 DIAGNOSIS — Z791 Long term (current) use of non-steroidal anti-inflammatories (NSAID): Secondary | ICD-10-CM

## 2014-03-07 DIAGNOSIS — S0990XA Unspecified injury of head, initial encounter: Secondary | ICD-10-CM | POA: Diagnosis not present

## 2014-03-07 DIAGNOSIS — R000 Tachycardia, unspecified: Secondary | ICD-10-CM

## 2014-03-07 DIAGNOSIS — Z79899 Other long term (current) drug therapy: Secondary | ICD-10-CM | POA: Insufficient documentation

## 2014-03-07 DIAGNOSIS — K219 Gastro-esophageal reflux disease without esophagitis: Secondary | ICD-10-CM | POA: Diagnosis present

## 2014-03-07 DIAGNOSIS — Z8669 Personal history of other diseases of the nervous system and sense organs: Secondary | ICD-10-CM

## 2014-03-07 DIAGNOSIS — J45909 Unspecified asthma, uncomplicated: Secondary | ICD-10-CM

## 2014-03-07 DIAGNOSIS — F4310 Post-traumatic stress disorder, unspecified: Secondary | ICD-10-CM | POA: Diagnosis not present

## 2014-03-07 DIAGNOSIS — F259 Schizoaffective disorder, unspecified: Secondary | ICD-10-CM | POA: Diagnosis not present

## 2014-03-07 DIAGNOSIS — F840 Autistic disorder: Secondary | ICD-10-CM | POA: Diagnosis not present

## 2014-03-07 DIAGNOSIS — F410 Panic disorder [episodic paroxysmal anxiety] without agoraphobia: Secondary | ICD-10-CM | POA: Diagnosis present

## 2014-03-07 DIAGNOSIS — W228XXA Striking against or struck by other objects, initial encounter: Secondary | ICD-10-CM | POA: Diagnosis present

## 2014-03-07 DIAGNOSIS — R451 Restlessness and agitation: Secondary | ICD-10-CM | POA: Diagnosis not present

## 2014-03-07 DIAGNOSIS — Z3202 Encounter for pregnancy test, result negative: Secondary | ICD-10-CM

## 2014-03-07 DIAGNOSIS — F515 Nightmare disorder: Secondary | ICD-10-CM | POA: Diagnosis present

## 2014-03-07 DIAGNOSIS — F250 Schizoaffective disorder, bipolar type: Secondary | ICD-10-CM | POA: Diagnosis not present

## 2014-03-07 DIAGNOSIS — G4710 Hypersomnia, unspecified: Secondary | ICD-10-CM | POA: Diagnosis present

## 2014-03-07 DIAGNOSIS — R45851 Suicidal ideations: Secondary | ICD-10-CM

## 2014-03-07 DIAGNOSIS — R440 Auditory hallucinations: Secondary | ICD-10-CM | POA: Diagnosis not present

## 2014-03-07 DIAGNOSIS — F603 Borderline personality disorder: Secondary | ICD-10-CM | POA: Diagnosis present

## 2014-03-07 DIAGNOSIS — Y92239 Unspecified place in hospital as the place of occurrence of the external cause: Secondary | ICD-10-CM

## 2014-03-07 LAB — URINALYSIS, ROUTINE W REFLEX MICROSCOPIC
Bilirubin Urine: NEGATIVE
Glucose, UA: NEGATIVE mg/dL
HGB URINE DIPSTICK: NEGATIVE
KETONES UR: NEGATIVE mg/dL
LEUKOCYTES UA: NEGATIVE
NITRITE: NEGATIVE
Protein, ur: NEGATIVE mg/dL
Specific Gravity, Urine: 1.024 (ref 1.005–1.030)
UROBILINOGEN UA: 0.2 mg/dL (ref 0.0–1.0)
pH: 5.5 (ref 5.0–8.0)

## 2014-03-07 LAB — PREGNANCY, URINE: Preg Test, Ur: NEGATIVE

## 2014-03-07 LAB — CBC
HEMATOCRIT: 39.3 % (ref 36.0–46.0)
Hemoglobin: 12.9 g/dL (ref 12.0–15.0)
MCH: 27.4 pg (ref 26.0–34.0)
MCHC: 32.8 g/dL (ref 30.0–36.0)
MCV: 83.6 fL (ref 78.0–100.0)
Platelets: 247 10*3/uL (ref 150–400)
RBC: 4.7 MIL/uL (ref 3.87–5.11)
RDW: 13.5 % (ref 11.5–15.5)
WBC: 8.7 10*3/uL (ref 4.0–10.5)

## 2014-03-07 LAB — COMPREHENSIVE METABOLIC PANEL
ALBUMIN: 4.1 g/dL (ref 3.5–5.2)
ALK PHOS: 88 U/L (ref 39–117)
ALT: 13 U/L (ref 0–35)
ANION GAP: 11 (ref 5–15)
AST: 28 U/L (ref 0–37)
BUN: 7 mg/dL (ref 6–23)
CO2: 23 mmol/L (ref 19–32)
Calcium: 9.5 mg/dL (ref 8.4–10.5)
Chloride: 105 mmol/L (ref 96–112)
Creatinine, Ser: 0.75 mg/dL (ref 0.50–1.10)
GFR calc Af Amer: >90 mL/min (ref >90)
GFR calc non Af Amer: >90 mL/min (ref >90)
Glucose, Bld: 126 mg/dL — ABNORMAL HIGH (ref 70–99)
Potassium: 2.9 mmol/L — ABNORMAL LOW (ref 3.5–5.1)
Sodium: 139 mmol/L (ref 135–145)
Total Bilirubin: 0.3 mg/dL (ref 0.3–1.2)
Total Protein: 7.1 g/dL (ref 6.0–8.3)

## 2014-03-07 LAB — ACETAMINOPHEN LEVEL: Acetaminophen (Tylenol), Serum: <10 ug/mL — ABNORMAL LOW (ref 10–30)

## 2014-03-07 LAB — SALICYLATE LEVEL: Salicylate Lvl: <4 mg/dL (ref 2.8–20.0)

## 2014-03-07 LAB — ETHANOL

## 2014-03-07 MED ORDER — POTASSIUM CHLORIDE 10 MEQ/100ML IV SOLN
10.0000 meq | Freq: Once | INTRAVENOUS | Status: AC
  Administered 2014-03-07: 10 meq via INTRAVENOUS
  Filled 2014-03-07: qty 100

## 2014-03-07 MED ORDER — SODIUM CHLORIDE 0.9 % IV BOLUS (SEPSIS)
1000.0000 mL | Freq: Once | INTRAVENOUS | Status: AC
  Administered 2014-03-07: 1000 mL via INTRAVENOUS

## 2014-03-07 MED ORDER — LORAZEPAM 2 MG/ML IJ SOLN
2.0000 mg | Freq: Once | INTRAMUSCULAR | Status: AC
  Administered 2014-03-07: 2 mg via INTRAVENOUS
  Filled 2014-03-07: qty 1

## 2014-03-07 MED ORDER — POTASSIUM CHLORIDE CRYS ER 20 MEQ PO TBCR
40.0000 meq | EXTENDED_RELEASE_TABLET | Freq: Once | ORAL | Status: AC
  Administered 2014-03-07: 40 meq via ORAL
  Filled 2014-03-07: qty 2

## 2014-03-07 NOTE — ED Notes (Signed)
Staffing sitter at bedside.  

## 2014-03-07 NOTE — ED Notes (Signed)
Pt is aware we need a urine sample. 

## 2014-03-07 NOTE — ED Notes (Signed)
Pt requested a HAPPY MEAL and Coke to drink pt given the same
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
## 2014-03-08 LAB — RAPID URINE DRUG SCREEN, HOSP PERFORMED
Amphetamines: NOT DETECTED
Barbiturates: NOT DETECTED
Benzodiazepines: NOT DETECTED
Cocaine: NOT DETECTED
OPIATES: NOT DETECTED
TETRAHYDROCANNABINOL: NOT DETECTED

## 2014-03-08 LAB — POTASSIUM: POTASSIUM: 3.7 mmol/L (ref 3.5–5.1)

## 2014-03-08 LAB — POC URINE PREG, ED: PREG TEST UR: NEGATIVE

## 2014-03-08 MED ORDER — ALUM & MAG HYDROXIDE-SIMETH 200-200-20 MG/5ML PO SUSP: 30.0000 mL | ORAL | Status: DC | PRN

## 2014-03-08 MED ORDER — FLUOXETINE HCL 20 MG PO CAPS
40.0000 mg | ORAL_CAPSULE | Freq: Every morning | ORAL | Status: DC
  Administered 2014-03-08 – 2014-03-09 (×2): 40 mg via ORAL
  Filled 2014-03-08 (×4): qty 2

## 2014-03-08 MED ORDER — DOCUSATE SODIUM 100 MG PO CAPS
100.0000 mg | ORAL_CAPSULE | Freq: Two times a day (BID) | ORAL | Status: DC
  Administered 2014-03-08 – 2014-03-09 (×4): 100 mg via ORAL
  Filled 2014-03-08 (×4): qty 1

## 2014-03-08 MED ORDER — ONDANSETRON HCL 4 MG PO TABS: 4.0000 mg | ORAL_TABLET | Freq: Three times a day (TID) | ORAL | Status: DC | PRN

## 2014-03-08 MED ORDER — IBUPROFEN 800 MG PO TABS
800.0000 mg | ORAL_TABLET | Freq: Three times a day (TID) | ORAL | Status: DC | PRN
Start: 2014-03-08 — End: 2014-03-09

## 2014-03-08 MED ORDER — FAMOTIDINE 20 MG PO TABS
20.0000 mg | ORAL_TABLET | Freq: Every day | ORAL | Status: DC
  Administered 2014-03-08 – 2014-03-09 (×2): 20 mg via ORAL
  Filled 2014-03-08 (×2): qty 1

## 2014-03-08 MED ORDER — METOPROLOL TARTRATE 25 MG PO TABS
25.0000 mg | ORAL_TABLET | Freq: Two times a day (BID) | ORAL | Status: DC
  Administered 2014-03-08 (×3): 25 mg via ORAL
  Filled 2014-03-08 (×4): qty 1

## 2014-03-08 MED ORDER — LORAZEPAM 1 MG PO TABS
1.0000 mg | ORAL_TABLET | Freq: Three times a day (TID) | ORAL | Status: DC | PRN
  Administered 2014-03-08 – 2014-03-09 (×2): 1 mg via ORAL
  Filled 2014-03-08 (×2): qty 1

## 2014-03-08 NOTE — BHH Counselor (Addendum)
[facility]) confirms that [facility] adult unit is at capacity. TTS contacted the following facilities in effort to locate inpt placement for pt:   BED AVAILABLE, FAXED CLINICAL INFORMATION: [place]: [place], per [facility], per [facility], per [facility], per [facility] per [facility], per [place], per [facility], per [facility], per [facility], per [facility], per [facility] per [facility], per [facility], per [facility], per [facility], per [facility], per [facility], per [facility], per [employer], per [employer], per [employer], per [facility]  NO RESPONSE: [facility]

## 2014-03-08 NOTE — ED Notes (Signed)
Pt resting with eyes closed. Respirations even and unlabored. Sitter at bedside

## 2014-03-08 NOTE — ED Notes (Signed)
"  i just need some time to sort things out because i have been thru a lot these last few days"

## 2014-03-08 NOTE — BHH Counselor (Signed)
TTS Counselor spoke with pt's attending RN and asked if tele-assessment cart could be placed in pt's room. TTS Counselor also reviewed pt's chart and MD note in preparation for behavioral health assessment.  Assessment to begin shortly.    Ramond Dial, [facility] Triage Specialist
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
## 2014-03-08 NOTE — Progress Notes (Signed)
Pt referral faxed to the following facilities:  [place]  Will continue to seek placement.  Peri Maris, [place] 03/08/2014 10:16 AM

## 2014-03-08 NOTE — ED Notes (Signed)
FAXED COPY OF PT GUARDIANSHIP PAPERS TO [place]

## 2014-03-08 NOTE — Progress Notes (Signed)
Pt is still under review at the following facilities:  [place] (at capacity on 03-08-14) [place]  Was declined at the following facilities:  [facility] due to substance abuse  Will continue to seek placement.   Peri Maris, [place] 03/08/2014 3:22 PM

## 2014-03-08 NOTE — ED Notes (Signed)
Pt. Was sleeping and started crying loudly in her sleep. Every time the sitter or RN tried to wake pt, the pt would cry louder. RN gave pt. PRN ativan to help her calm down. Pt. States that she did not want to take any more medication. RN explained to pt. That the ativan would help relax pt. And pt. Agreed to the medication.

## 2014-03-08 NOTE — ED Notes (Signed)
[employer] officer brought IVC paperwork signed by [employer] fast streamer and spoke with patient regarding inability to leave with this RN
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
## 2014-03-08 NOTE — ED Notes (Signed)
IVC PAPERS FAXED TO [facility]. CALLED AND HAVE CONFIRMED THEY HAVE RECEIVED THEM.

## 2014-03-08 NOTE — BHH Counselor (Signed)
Per Glenda Chroman, NP Pt meets criteria for inpatient psychiatric treatment. TTS will seek placement at other facilities which accept individuals with autism spectrum disorder.  TTS Counselor informed pt's attending RN of disposition, who stated that she will share this information with the patient.   Ramond Dial, [facility] Triage Specialist

## 2014-03-08 NOTE — ED Notes (Addendum)
Pt. Is calmer after PRN dose of ativan. Pt. Is more cooperative and relaxed. RN will continue to monitor pt.

## 2014-03-08 NOTE — ED Notes (Signed)
SPOKE TO PT COURT APPOINTED GUARDIAN [name]. HE IS SENDING OVER THE GUARDIANSHIP PAPERS NOW.

## 2014-03-08 NOTE — ED Notes (Signed)
BH called and they are faxing guardianship papers back to [employer] fax machine.

## 2014-03-09 ENCOUNTER — Encounter (HOSPITAL_COMMUNITY): Payer: Self-pay | Admitting: *Deleted

## 2014-03-09 ENCOUNTER — Inpatient Hospital Stay (HOSPITAL_COMMUNITY)
Admission: AD | Admit: 2014-03-09 | Discharge: 2014-03-16 | DRG: 885 | Disposition: A | Discharge status: Other Acute Inpatient Hospital | Payer: Medicare Other | Source: Intra-hospital | Attending: Emergency Medicine | Admitting: Emergency Medicine

## 2014-03-09 DIAGNOSIS — R45851 Suicidal ideations: Secondary | ICD-10-CM

## 2014-03-09 DIAGNOSIS — F845 Asperger's syndrome: Secondary | ICD-10-CM | POA: Diagnosis not present

## 2014-03-09 DIAGNOSIS — R451 Restlessness and agitation: Secondary | ICD-10-CM

## 2014-03-09 DIAGNOSIS — W228XXA Striking against or struck by other objects, initial encounter: Secondary | ICD-10-CM | POA: Diagnosis present

## 2014-03-09 DIAGNOSIS — S0990XA Unspecified injury of head, initial encounter: Secondary | ICD-10-CM

## 2014-03-09 DIAGNOSIS — F603 Borderline personality disorder: Secondary | ICD-10-CM | POA: Diagnosis not present

## 2014-03-09 DIAGNOSIS — Y92239 Unspecified place in hospital as the place of occurrence of the external cause: Secondary | ICD-10-CM | POA: Diagnosis not present

## 2014-03-09 DIAGNOSIS — I4581 Long QT syndrome: Secondary | ICD-10-CM | POA: Diagnosis not present

## 2014-03-09 DIAGNOSIS — F410 Panic disorder [episodic paroxysmal anxiety] without agoraphobia: Secondary | ICD-10-CM | POA: Diagnosis present

## 2014-03-09 DIAGNOSIS — F99 Mental disorder, not otherwise specified: Secondary | ICD-10-CM | POA: Diagnosis not present

## 2014-03-09 DIAGNOSIS — F4310 Post-traumatic stress disorder, unspecified: Secondary | ICD-10-CM

## 2014-03-09 DIAGNOSIS — F250 Schizoaffective disorder, bipolar type: Secondary | ICD-10-CM | POA: Diagnosis present

## 2014-03-09 DIAGNOSIS — S0083XA Contusion of other part of head, initial encounter: Secondary | ICD-10-CM | POA: Diagnosis not present

## 2014-03-09 DIAGNOSIS — F259 Schizoaffective disorder, unspecified: Secondary | ICD-10-CM | POA: Diagnosis not present

## 2014-03-09 DIAGNOSIS — F840 Autistic disorder: Secondary | ICD-10-CM | POA: Diagnosis present

## 2014-03-09 DIAGNOSIS — G4710 Hypersomnia, unspecified: Secondary | ICD-10-CM | POA: Diagnosis present

## 2014-03-09 DIAGNOSIS — J45909 Unspecified asthma, uncomplicated: Secondary | ICD-10-CM | POA: Diagnosis present

## 2014-03-09 DIAGNOSIS — K219 Gastro-esophageal reflux disease without esophagitis: Secondary | ICD-10-CM | POA: Diagnosis present

## 2014-03-09 DIAGNOSIS — F29 Unspecified psychosis not due to a substance or known physiological condition: Secondary | ICD-10-CM | POA: Diagnosis not present

## 2014-03-09 DIAGNOSIS — T1490XA Injury, unspecified, initial encounter: Secondary | ICD-10-CM

## 2014-03-09 DIAGNOSIS — Z791 Long term (current) use of non-steroidal anti-inflammatories (NSAID): Secondary | ICD-10-CM | POA: Diagnosis not present

## 2014-03-09 DIAGNOSIS — F989 Unspecified behavioral and emotional disorders with onset usually occurring in childhood and adolescence: Secondary | ICD-10-CM | POA: Diagnosis not present

## 2014-03-09 DIAGNOSIS — F515 Nightmare disorder: Secondary | ICD-10-CM | POA: Diagnosis present

## 2014-03-09 MED ORDER — IBUPROFEN 800 MG PO TABS
800.0000 mg | ORAL_TABLET | Freq: Three times a day (TID) | ORAL | Status: DC | PRN
  Administered 2014-03-10 – 2014-03-16 (×9): 800 mg via ORAL
  Filled 2014-03-09 (×11): qty 1

## 2014-03-09 MED ORDER — ACETAMINOPHEN 325 MG PO TABS: 650.0000 mg | ORAL_TABLET | Freq: Four times a day (QID) | ORAL | Status: DC | PRN

## 2014-03-09 MED ORDER — LORAZEPAM 1 MG PO TABS
2.0000 mg | ORAL_TABLET | Freq: Four times a day (QID) | ORAL | Status: DC | PRN
  Administered 2014-03-09 – 2014-03-10 (×3): 2 mg via ORAL
  Filled 2014-03-09 (×2): qty 2

## 2014-03-09 MED ORDER — DOCUSATE SODIUM 100 MG PO CAPS
100.0000 mg | ORAL_CAPSULE | Freq: Two times a day (BID) | ORAL | Status: DC
  Administered 2014-03-09 – 2014-03-10 (×2): 100 mg via ORAL
  Filled 2014-03-09 (×6): qty 1

## 2014-03-09 MED ORDER — FAMOTIDINE 20 MG PO TABS
20.0000 mg | ORAL_TABLET | Freq: Every day | ORAL | Status: DC
Start: 2014-03-10 — End: 2014-03-16
  Administered 2014-03-10 – 2014-03-16 (×7): 20 mg via ORAL
  Filled 2014-03-09 (×10): qty 1

## 2014-03-09 MED ORDER — TRAZODONE HCL 50 MG PO TABS
50.0000 mg | ORAL_TABLET | Freq: Every evening | ORAL | Status: DC | PRN
  Filled 2014-03-09: qty 1

## 2014-03-09 MED ORDER — FLUOXETINE HCL 20 MG PO CAPS
40.0000 mg | ORAL_CAPSULE | Freq: Every morning | ORAL | Status: DC
  Filled 2014-03-09 (×2): qty 2

## 2014-03-09 MED ORDER — METOPROLOL TARTRATE 25 MG PO TABS
25.0000 mg | ORAL_TABLET | Freq: Two times a day (BID) | ORAL | Status: DC
  Administered 2014-03-10: 25 mg via ORAL
  Filled 2014-03-09 (×6): qty 1

## 2014-03-09 MED ORDER — MAGNESIUM HYDROXIDE 400 MG/5ML PO SUSP: 30.0000 mL | Freq: Every day | ORAL | Status: DC | PRN
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
## 2014-03-09 NOTE — ED Notes (Signed)
Pt called out asking for something to eat. This RN informed that pt that no snacks are given out until breakfast at 7. Pt is calm and quiet at this time.

## 2014-03-09 NOTE — ED Provider Notes (Signed)
  Physical Exam  BP 79/62 mmHg  Pulse 96  Temp(Src) 97.9 F (36.6 C) (Oral)  Resp 20  Ht 5\' 5"  (1.651 m)  Wt 185 lb (83.915 kg)  BMI 30.79 kg/m2  SpO2 99%  LMP 02/18/2014 (Within Days)  Physical Exam  ED Course  Procedures  MDM Accepted at [facility].      Jasper Riling. Alvino Chapel, MD 03/09/14 1257
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
## 2014-03-09 NOTE — Progress Notes (Signed)
Spoke with Guardian and told him about the location of the pt. Guardian will be calling to speak with social work tomorrow.   Bedelia Person, M.S., LPCA, [facility] Licensed Professional Counselor Associate  Triage Specialist  [facility]  Therapeutic Triage Services Phone: [phone] Fax: [phone]
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
## 2014-03-09 NOTE — Progress Notes (Signed)
Patient ID: [name], female   DOB: [birth year], 24 y.o.   MRN: [id number] PER STATE REGULATIONS 482.30  THIS CHART WAS REVIEWED FOR MEDICAL NECESSITY WITH RESPECT TO THE PATIENT'S ADMISSION/DURATION OF STAY.  NEXT REVIEW DATE: 03/13/14  Roma Schanz, RN, BSN CASE MANAGER

## 2014-03-09 NOTE — ED Notes (Signed)
Patient currently having tele psych conference in room.

## 2014-03-09 NOTE — ED Notes (Signed)
Pt given new paper scrub top and a cup of water; no additional needs expressed at this time

## 2014-03-09 NOTE — Progress Notes (Signed)
Per Lyda Jester at [facility], [place] has been accepted to [facility], bed 506-2 to Dr. Shea Evans. ED nurse notified.  Peri Maris, [place] 03/09/2014 1:36 PM

## 2014-03-09 NOTE — ED Notes (Signed)
Pt kicking in bed and crying loudly, apparently asleep. Pt's sitter states that the pt occassionally calls out in her sleep.
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
## 2014-03-10 ENCOUNTER — Encounter (HOSPITAL_COMMUNITY): Payer: Self-pay | Admitting: Psychiatry

## 2014-03-10 DIAGNOSIS — R45851 Suicidal ideations: Secondary | ICD-10-CM

## 2014-03-10 DIAGNOSIS — F845 Asperger's syndrome: Secondary | ICD-10-CM

## 2014-03-10 DIAGNOSIS — F4310 Post-traumatic stress disorder, unspecified: Secondary | ICD-10-CM

## 2014-03-10 DIAGNOSIS — F250 Schizoaffective disorder, bipolar type: Principal | ICD-10-CM

## 2014-03-10 DIAGNOSIS — F603 Borderline personality disorder: Secondary | ICD-10-CM | POA: Diagnosis present

## 2014-03-10 MED ORDER — FLUOXETINE HCL 20 MG PO CAPS: 20.0000 mg | ORAL_CAPSULE | Freq: Every morning | ORAL | Status: DC

## 2014-03-10 MED ORDER — ESCITALOPRAM OXALATE 10 MG PO TABS
10.0000 mg | ORAL_TABLET | Freq: Every day | ORAL | Status: DC
Start: 2014-03-10 — End: 2014-03-11
  Administered 2014-03-10 – 2014-03-11 (×2): 10 mg via ORAL
  Filled 2014-03-10 (×4): qty 1

## 2014-03-10 MED ORDER — FERROUS SULFATE 325 (65 FE) MG PO TABS
325.0000 mg | ORAL_TABLET | Freq: Every day | ORAL | Status: DC
  Administered 2014-03-10 – 2014-03-15 (×6): 325 mg via ORAL
  Filled 2014-03-10 (×10): qty 1

## 2014-03-10 MED ORDER — LORAZEPAM 0.5 MG PO TABS
0.5000 mg | ORAL_TABLET | Freq: Four times a day (QID) | ORAL | Status: DC | PRN
  Administered 2014-03-10 – 2014-03-12 (×3): 0.5 mg via ORAL
  Filled 2014-03-10 (×3): qty 1

## 2014-03-10 MED ORDER — CITALOPRAM HYDROBROMIDE 10 MG PO TABS: 10.0000 mg | ORAL_TABLET | Freq: Every day | ORAL | Status: DC

## 2014-03-10 MED ORDER — METOPROLOL TARTRATE 25 MG PO TABS
25.0000 mg | ORAL_TABLET | Freq: Two times a day (BID) | ORAL | Status: DC
  Administered 2014-03-10 – 2014-03-16 (×11): 25 mg via ORAL
  Filled 2014-03-10 (×19): qty 1

## 2014-03-10 MED ORDER — TEMAZEPAM 7.5 MG PO CAPS
7.5000 mg | ORAL_CAPSULE | Freq: Every evening | ORAL | Status: DC | PRN
  Administered 2014-03-10 – 2014-03-11 (×2): 7.5 mg via ORAL
  Filled 2014-03-10 (×2): qty 1

## 2014-03-10 MED ORDER — LAMOTRIGINE 25 MG PO TABS
25.0000 mg | ORAL_TABLET | Freq: Every day | ORAL | Status: DC
  Administered 2014-03-10 – 2014-03-14 (×5): 25 mg via ORAL
  Filled 2014-03-10 (×7): qty 1

## 2014-03-10 MED ORDER — OLANZAPINE 2.5 MG PO TABS
2.5000 mg | ORAL_TABLET | Freq: Every day | ORAL | Status: DC
  Filled 2014-03-10: qty 1

## 2014-03-10 MED ORDER — BENZTROPINE MESYLATE 0.5 MG PO TABS
0.5000 mg | ORAL_TABLET | Freq: Every day | ORAL | Status: DC
  Filled 2014-03-10: qty 1

## 2014-03-10 MED ORDER — LORAZEPAM 1 MG PO TABS
2.0000 mg | ORAL_TABLET | Freq: Four times a day (QID) | ORAL | Status: DC | PRN
  Administered 2014-03-11 (×3): 2 mg via ORAL
  Filled 2014-03-10 (×3): qty 2

## 2014-03-10 MED ORDER — DOCUSATE SODIUM 100 MG PO CAPS
100.0000 mg | ORAL_CAPSULE | Freq: Two times a day (BID) | ORAL | Status: DC
  Administered 2014-03-10 – 2014-03-16 (×12): 100 mg via ORAL
  Filled 2014-03-10 (×21): qty 1

## 2014-03-10 NOTE — [facility] Group Notes (Signed)
[facility] LCSW Aftercare Discharge Planning Group Note   03/10/2014 12:47 PM  Participation Quality:  Did not attend    [name]
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
## 2014-03-10 NOTE — Progress Notes (Signed)
Discussed abnormal ECG and prolonged QTC with Dr Ron Parker.  His recommendation is not to give Zyprexa for this evening.  Will call cardiology pager # [phone] or [phone] to contact electrophysiologist in the morning.
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
## 2014-03-10 NOTE — BHH Group Notes (Signed)
[facility] LCSW Group Therapy  03/10/2014 1:35 PM  Type of Therapy:  Group Therapy  Participation Level:  Minimal  Participation Quality:  Attentive  Affect:  Flat  Cognitive:  Alert  Insight:  Limited  Engagement in Therapy:  Limited  Modes of Intervention:  Education, Socialization and Support  Summary of Progress/Problems:Mental [employer] ([place]) speaker came to talk about his personal journey with substance abuse and mental illness. Group members were challenged to process ways by which to relate to the speaker. [place] speaker provided handouts and educational information pertaining to groups and services offered by the [facility]. [name] attended group and stayed the entire time. She sat quietly and listened to the speaker.    Ward,Evelyn 03/10/2014, 1:35 PM
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
## 2014-03-11 LAB — MAGNESIUM: MAGNESIUM: 1.9 mg/dL (ref 1.5–2.5)

## 2014-03-11 LAB — GC/CHLAMYDIA PROBE AMP (~~LOC~~) NOT AT ARMC
CHLAMYDIA, DNA PROBE: NEGATIVE
Neisseria Gonorrhea: NEGATIVE

## 2014-03-11 MED ORDER — ESCITALOPRAM OXALATE 20 MG PO TABS
20.0000 mg | ORAL_TABLET | Freq: Every day | ORAL | Status: DC
  Administered 2014-03-12 – 2014-03-16 (×5): 20 mg via ORAL
  Filled 2014-03-11 (×8): qty 1

## 2014-03-11 MED ORDER — DIPHENHYDRAMINE HCL 50 MG PO CAPS
50.0000 mg | ORAL_CAPSULE | Freq: Once | ORAL | Status: AC
  Administered 2014-03-12: 50 mg via ORAL
  Filled 2014-03-11: qty 2
  Filled 2014-03-11: qty 1

## 2014-03-11 MED ORDER — GABAPENTIN 100 MG PO CAPS
200.0000 mg | ORAL_CAPSULE | ORAL | Status: DC
  Administered 2014-03-11 – 2014-03-16 (×15): 200 mg via ORAL
  Filled 2014-03-11 (×29): qty 2
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
## 2014-03-11 NOTE — Progress Notes (Signed)
Psychoeducational Group Note  Date:  03/11/2014 Time:  0945  Group Topic/Focus:  Goals Group:   The focus of this group is to help patients establish daily goals to achieve during treatment and discuss how the patient can incorporate goal setting into their daily lives to aide in recovery.  Participation Level: Did Not Attend  Participation Quality:  Not Applicable  Affect:  Not Applicable  Cognitive:  Not Applicable  Insight:  Not Applicable  Engagement in Group: Not Applicable  Additional Comments:    Lauralyn Primes 03/11/2014, 6:22 PM

## 2014-03-11 NOTE — Progress Notes (Addendum)
Spoke with Dr Rayann Heman Electrophysiologist and discussed his best recommendation:  Avoid any anti-psychotics.  Will need to check if Mg and K levels are WNL.  Will ckeck MG level tonight.  K level normal.  Informed Dr Shea Evans.

## 2014-03-11 NOTE — Progress Notes (Signed)
[name] has had a hard day today. Initially, she refused to get up and take morning meds. She also refused morning labwork. MD made aware. This nurse awakened pt at 0930 and she reluctantly took her morning meds scheduled. She is flat, depressed and avoids eye contact.    A Her affect ranges from gamey to irritable and  she refuses all offers from this nurse to console her ( when she begins sobbing loudly in the dayroom this afternoon) after her guardian visited. When she doesn't know staff is watching  her, she is seen looking up towards  Nurses and very quickly looks down and away when she catches this nurse's eyes. When physician approaches her and tries to speak with her and comfort her, she begins to start sobbing LOUDER and begins to breathe heavier. She remains unconsoleable and this nurse monitors her for15 min while she sits in the dayroom chair- alone - and sobs / wails /  Breathes heavily. She refuses to go to her room, to go to the quiet room, to take prn medication, change places, or to allow staff to console her or speak with her.   R She answers all questions on her daily assessment as this nurse asks them , she dictates the answers and this  Nurse transcribes answers on to the sheet. She writes she is POSITIVE for feelings of SI within the past 24 hrs  ( but she is able to contract for safety with this nurse) and she rates her depression, hopelessness and anxiety  " 8/8/8 " , respectively. Safety is in place.

## 2014-03-11 NOTE — BHH Group Notes (Signed)
[facility] Group Notes:  (Counselor/Nursing/MHT/Case Management/Adjunct)  03/11/2014 1:15PM  Type of Therapy:  Group Therapy  Participation Level:  Active  Participation Quality:  Appropriate  Affect:  Flat  Cognitive:  Oriented  Insight:  Improving  Engagement in Group:  Limited  Engagement in Therapy:  Limited  Modes of Intervention:  Discussion, Exploration and Socialization  Summary of Progress/Problems: The topic for group was balance in life.  Pt participated in the discussion about when their life was in balance and out of balance and how this feels.  Pt discussed ways to get back in balance and short term goals they can work on to get where they want to be. "When I get angry, I start throwing things, and I say things that I later feel sorry about."  Talked about how her family is a trigger for her, and her struggles with overwhelming anxiety as well.  Participated minimally, displays minimal insight, but stayed for the entire time.   Roque Lias B 03/11/2014 1:10 PM

## 2014-03-11 NOTE — Progress Notes (Signed)
Psychoeducational Group Note  Date:  03/11/2014 Time:  0945  Group Topic/Focus:  Goals Group:   The focus of this group is to help patients establish daily goals to achieve during treatment and discuss how the patient can incorporate goal setting into their daily lives to aide in recovery.  Participation Level: Did Not Attend  Participation Quality:  Not Applicable  Affect:  Not Applicable  Cognitive:  Not Applicable  Insight:  Not Applicable  Engagement in Group: Not Applicable  Additional Comments:    Lauralyn Primes 03/11/2014, 5:34 PM
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
## 2014-03-12 MED ORDER — PRAZOSIN HCL 1 MG PO CAPS
1.0000 mg | ORAL_CAPSULE | Freq: Every day | ORAL | Status: DC
  Administered 2014-03-12 – 2014-03-16 (×4): 1 mg via ORAL
  Filled 2014-03-12 (×7): qty 1

## 2014-03-12 MED ORDER — TUBERCULIN PPD 5 UNIT/0.1ML ID SOLN
5.0000 [IU] | Freq: Once | INTRADERMAL | Status: AC
  Administered 2014-03-12: 5 [IU] via INTRADERMAL

## 2014-03-12 MED ORDER — HYDROXYZINE HCL 25 MG PO TABS
25.0000 mg | ORAL_TABLET | ORAL | Status: DC | PRN
  Administered 2014-03-12 – 2014-03-15 (×5): 25 mg via ORAL
  Filled 2014-03-12 (×5): qty 1

## 2014-03-12 MED ORDER — CLONAZEPAM 0.5 MG PO TABS
0.2500 mg | ORAL_TABLET | Freq: Two times a day (BID) | ORAL | Status: DC | PRN
  Administered 2014-03-12 – 2014-03-14 (×6): 0.25 mg via ORAL
  Filled 2014-03-12 (×6): qty 1

## 2014-03-12 NOTE — Progress Notes (Signed)
Pt says she has received conflicting information about whether she may return to [facility]. She says Latoya at the home told her she could return, while another person from [facility] said she may not. She wanted social work to speak with [place] at [phone].  Pt has often been tearful this afternoon. She reported having upsetting phone calls with her parents and says they told her they did not want to speak with her. She says the only support she has left is her 26 year old fiance. Spoke with pt and provided support/encouragement. Will continue to monitor.

## 2014-03-12 NOTE — Plan of Care (Signed)
Problem: Diagnosis: Increased Risk For Suicide Attempt Goal: STG-Patient Will Comply With Medication Regime Outcome: Progressing Pt has been compliant with all medications this shift.

## 2014-03-12 NOTE — BHH Group Notes (Signed)
[facility] LCSW Aftercare Discharge Planning Group Note   03/12/2014 10:57 AM  Participation Quality:  Invited  Chose to not attend    [name]

## 2014-03-12 NOTE — Progress Notes (Signed)
Patient ID: [name], female   DOB: 1990-11-19, 24 y.o.   MRN: [id number] Patient walking around nursing station and pacing, entering nursing station and standing behind staff. Pt refusing to comply with directions to abide by boundaries. Pt smiling while pretending to wail and cry loudly at the desk. Pt behaviors redirected. Pt went to her room and began to hit the walls and yell. Pt informed that she had an option to go to the quiet room if behaviors continued, unless she can calm down and behave appropriately. Pt needing frequent redirection for this.

## 2014-03-12 NOTE — Progress Notes (Signed)
Patient ID: [name], female   DOB: [birth year], 24 y.o.   MRN: [id number] Pt continuing to yell and talk loudly to herself. Pt states she is anxious and mad at her dad; pt not explaining why she was feeling this way. PRN Vistaril given as ordered.

## 2014-03-12 NOTE — Progress Notes (Signed)
PPD placed in left arm at 1458 on 2/5. Area circled with marker. Pt tolerated well and instructed to leave area alone.

## 2014-03-12 NOTE — Progress Notes (Signed)
[name] was awakened and did eat some lunch. She later complained of anxiety and requested Ativan. Explained order changes and gave her Vistaril, which she accepted without complaint. She is now in group. Will continue to monitor.
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
## 2014-03-12 NOTE — Progress Notes (Signed)
D: Pt has labile, anxious affect and mood.  Pt reports she did not have a goal for the day.  She reports passive SI and verbally contracts for safety.  Pt reports "I heard some voices to kill myself or hurt myself."  Pt denies HI, denies visual hallucinations.  Pt paces the hallway at times and stomps her feet or lightly hits the wall until she receives attention.  Pt was agitated when a peer was yelling on the unit and became anxious and irritable after a phone call.  Pt's agitation is easily triggered.  Pt initially refused to leave the dayroom at bedtime.  Pt reported this is because of flashbacks she has at night.  When pt did go to her room, she began breathing heavily and was very loud.  Pt denied pain, denies hallucinations at this time.  Pt reported that she was scared.   A: Writer and another RN stayed with pt and provided reassurance of her safety.  Pt encouraged to take deep breaths.  Within a few minutes of taking Benadryl 50mg  POX1, pt de-escalated.   Bathroom light left on per pt's request.  Pt reassured that staff will be checking on her at least every 15 minutes for safety.  Pt reported that she felt better and pt denied further needs.  Safety maintained.  Encouraged and supported pt.  PRN medication administered for sleep and anxiety, see flowsheet.   R: Pt verbally contracted for safety.  She is safe on the unit.  Pt reports she will notify staff of needs and concerns.  Will continue to monitor and assess for safety.

## 2014-03-13 LAB — TSH: TSH: 1.096 u[IU]/mL (ref 0.350–4.500)
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
## 2014-03-13 NOTE — [facility] Group Notes (Signed)
[facility] Group Notes:  (Nursing/MHT/Case Management/Adjunct)  Date:  03/13/2014  Time:  9:30am  Type of Therapy:  Nurse Education  Participation Level:    Participation Quality:    Affect:    Cognitive:    Insight:    Engagement in Group:    Modes of Intervention:    Summary of Progress/Problems: Patient was invited to attend group however elected to remain in bed.  Jamie Kato 03/13/2014, 3:50 PM

## 2014-03-13 NOTE — Progress Notes (Signed)
D: The patient made a few telephone calls and then became loud and agitated. She is upset over being told that she has to wait to shave. The patient has been yelling and cursing at her peers and the staff. She is threatening to hit her hand and hurt herself.   A: This author along with two of the staff attempted to de-escalate the patient but have been unsuccessful thus far.   R: The patient remains loud and agitated.

## 2014-03-13 NOTE — Progress Notes (Signed)
[facility] Group Notes:  (Nursing/MT/Case Management/Adjunct)  Date:  03/13/2014  Time:  9:22 PM  Type of Therapy:  Psychoeducational Skills  Participation Level:  Active  Participation Quality:  Appropriate  Affect:  Appropriate  Cognitive:  Appropriate  Insight:  Appropriate  Engagement in Group:  Engaged  Modes of Intervention:  Education  Summary of Progress/Problems: Patient shared with the group that she had a "great" day overall. She elaborated by saying that she had a good visit with her fiance and also had a good visit with her uncle. She states that her uncle is her only support system in her life. In terms of the theme for the day, her coping skill is to listen to music.   [name] 03/13/2014, 9:22 PM

## 2014-03-13 NOTE — [facility] Group Notes (Signed)
[facility] Group Notes: (Clinical Social Work)   03/13/2014      Type of Therapy:  Group Therapy   Participation Level:  Did Not Attend despite MHT prompting   Selmer Dominion, LCSW 03/13/2014, 1:13 PM

## 2014-03-14 MED ORDER — LAMOTRIGINE 25 MG PO TABS
50.0000 mg | ORAL_TABLET | Freq: Every day | ORAL | Status: DC
  Administered 2014-03-15 – 2014-03-16 (×2): 50 mg via ORAL
  Filled 2014-03-14 (×5): qty 2
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
## 2014-03-14 NOTE — [facility] Group Notes (Signed)
[facility] Group Notes:  (Nursing/MHT/Case Management/Adjunct)  Date:  03/14/2014  Time:  0930am  Type of Therapy:  Self Inventory Group with RN  Participation Level:  Did Not Attend  Participation Quality:  Did not attend  Affect:  Did not attend  Cognitive:  Did not attend  Insight:  None  Engagement in Group:  Did not attend  Modes of Intervention:  Discussion, Education and Support  Summary of Progress/Problems: Patient was invited to group. Pt did not attend and remained in bed resting.  Charlyne Quale A 03/14/2014, 10:53 AM
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
## 2014-03-14 NOTE — [facility] Group Notes (Signed)
[facility] Group Notes: (Clinical Social Work)   03/14/2014      Type of Therapy:  Group Therapy   Participation Level:  Did Not Attend despite MHT prompting   Selmer Dominion, LCSW 03/14/2014, 2:19 PM

## 2014-03-15 ENCOUNTER — Inpatient Hospital Stay (HOSPITAL_COMMUNITY): Payer: Medicare Other

## 2014-03-15 DIAGNOSIS — I4581 Long QT syndrome: Secondary | ICD-10-CM | POA: Diagnosis not present

## 2014-03-15 DIAGNOSIS — F259 Schizoaffective disorder, unspecified: Secondary | ICD-10-CM | POA: Diagnosis not present

## 2014-03-15 LAB — COMPREHENSIVE METABOLIC PANEL
ALT: 14 U/L (ref 0–35)
ANION GAP: 7 (ref 5–15)
AST: 19 U/L (ref 0–37)
Albumin: 4.3 g/dL (ref 3.5–5.2)
Alkaline Phosphatase: 68 U/L (ref 39–117)
BUN: 11 mg/dL (ref 6–23)
CO2: 21 mmol/L (ref 19–32)
Calcium: 9.5 mg/dL (ref 8.4–10.5)
Chloride: 109 mmol/L (ref 96–112)
Creatinine, Ser: 0.73 mg/dL (ref 0.50–1.10)
GFR calc Af Amer: >90 mL/min (ref >90)
GLUCOSE: 91 mg/dL (ref 70–99)
Potassium: 3.9 mmol/L (ref 3.5–5.1)
SODIUM: 137 mmol/L (ref 135–145)
Total Bilirubin: 0.4 mg/dL (ref 0.3–1.2)
Total Protein: 7.2 g/dL (ref 6.0–8.3)

## 2014-03-15 LAB — CBC WITH DIFFERENTIAL/PLATELET
BASOS PCT: 0 % (ref 0–1)
Basophils Absolute: 0 10*3/uL (ref 0.0–0.1)
EOS PCT: 0 % (ref 0–5)
Eosinophils Absolute: 0 10*3/uL (ref 0.0–0.7)
HEMATOCRIT: 36.2 % (ref 36.0–46.0)
Hemoglobin: 11.7 g/dL — ABNORMAL LOW (ref 12.0–15.0)
LYMPHS ABS: 2.2 10*3/uL (ref 0.7–4.0)
LYMPHS PCT: 19 % (ref 12–46)
MCH: 27 pg (ref 26.0–34.0)
MCHC: 32.3 g/dL (ref 30.0–36.0)
MCV: 83.6 fL (ref 78.0–100.0)
MONO ABS: 0.6 10*3/uL (ref 0.1–1.0)
MONOS PCT: 5 % (ref 3–12)
NEUTROS PCT: 76 % (ref 43–77)
Neutro Abs: 8.7 10*3/uL — ABNORMAL HIGH (ref 1.7–7.7)
Platelets: 249 10*3/uL (ref 150–400)
RBC: 4.33 MIL/uL (ref 3.87–5.11)
RDW: 13.7 % (ref 11.5–15.5)
WBC: 11.5 10*3/uL — AB (ref 4.0–10.5)

## 2014-03-15 LAB — URINALYSIS, ROUTINE W REFLEX MICROSCOPIC
Bilirubin Urine: NEGATIVE
GLUCOSE, UA: NEGATIVE mg/dL
Ketones, ur: 40 mg/dL — AB
Leukocytes, UA: NEGATIVE
Nitrite: NEGATIVE
PROTEIN: NEGATIVE mg/dL
Specific Gravity, Urine: 1.024 (ref 1.005–1.030)
UROBILINOGEN UA: 0.2 mg/dL (ref 0.0–1.0)
pH: 5.5 (ref 5.0–8.0)

## 2014-03-15 LAB — HIV ANTIBODY (ROUTINE TESTING W REFLEX): HIV Screen 4th Generation wRfx: NONREACTIVE

## 2014-03-15 LAB — URINE MICROSCOPIC-ADD ON

## 2014-03-15 LAB — PREGNANCY, URINE: Preg Test, Ur: NEGATIVE

## 2014-03-15 MED ORDER — LORAZEPAM 2 MG/ML IJ SOLN
2.0000 mg | Freq: Once | INTRAMUSCULAR | Status: AC
  Administered 2014-03-15: 2 mg via INTRAMUSCULAR

## 2014-03-15 MED ORDER — LORAZEPAM 2 MG/ML IJ SOLN
INTRAMUSCULAR | Status: AC
  Filled 2014-03-15: qty 1

## 2014-03-15 MED ORDER — LORAZEPAM 1 MG PO TABS: 2.0000 mg | ORAL_TABLET | Freq: Once | ORAL | Status: DC

## 2014-03-15 MED ORDER — LORAZEPAM 2 MG/ML IJ SOLN
4.0000 mg | Freq: Once | INTRAMUSCULAR | Status: AC
  Administered 2014-03-15: 4 mg via INTRAVENOUS
  Filled 2014-03-15: qty 2

## 2014-03-15 MED ORDER — CLONAZEPAM 0.5 MG PO TABS: 0.5000 mg | ORAL_TABLET | Freq: Once | ORAL | Status: DC

## 2014-03-15 MED ORDER — LORAZEPAM 2 MG/ML IJ SOLN
INTRAMUSCULAR | Status: AC
  Administered 2014-03-15: 04:00:00
  Filled 2014-03-15: qty 1

## 2014-03-15 MED ORDER — CLONAZEPAM 1 MG PO TABS
1.0000 mg | ORAL_TABLET | Freq: Every day | ORAL | Status: DC
  Administered 2014-03-16: 1 mg via ORAL
  Filled 2014-03-15: qty 1

## 2014-03-15 MED ORDER — ZOLPIDEM TARTRATE 5 MG PO TABS
5.0000 mg | ORAL_TABLET | Freq: Every evening | ORAL | Status: DC | PRN
  Administered 2014-03-16: 5 mg via ORAL
  Filled 2014-03-15: qty 1

## 2014-03-15 MED ORDER — LORAZEPAM 2 MG/ML IJ SOLN
2.0000 mg | INTRAMUSCULAR | Status: AC
  Administered 2014-03-15: 2 mg via INTRAMUSCULAR

## 2014-03-15 MED ORDER — LORAZEPAM 2 MG/ML IJ SOLN: 2.0000 mg | Freq: Once | INTRAMUSCULAR | Status: AC

## 2014-03-15 NOTE — ED Notes (Signed)
Patient has been moved to room 14. [employer] remain at bedside. Sitter at bedside. Pt remains yelling and screaming. She still fighting against restraints.
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
## 2014-03-15 NOTE — ED Notes (Signed)
Patient ambulated to restroom and back to bed with assistance x 2. Pt states she is dizzy and blurry vision.

## 2014-03-15 NOTE — Progress Notes (Signed)
Client beating on bed, screaming, hollering, having pseudo-anxiety attack, trying to hyperventilating, Writer encouraged client to slow down breathing by inhaling deep from diaphragm. Client cries harder and refused to follow instructions, also gave client a brown bag to breath into, client exclaims "you just don't know" Writer called [name]. PA, received order for Ativan 2 mg  IM/PO administered IM. Writer to monitor for efficacy within one hour.

## 2014-03-15 NOTE — ED Notes (Signed)
Patient is resting quietly with sitter and [employer] at bedside. Patient was given ginger ale when administering medication earlier. Appears in no acute distress.

## 2014-03-15 NOTE — ED Notes (Signed)
Patient has been transported to CT. Before going to CT, removed mask from patients face with her request. She reports she will not spit. Informed patient if she starts spitting, mask will go on.
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
## 2014-03-15 NOTE — ED Notes (Signed)
Patient has returned from [place]. She was calm and cooperative for CT scan to be completed. Removed the soft posey restraint.

## 2014-03-15 NOTE — ED Notes (Signed)
Report to [facility] at [facility] for patient-patient sitting up in bed drinking water and eating a [place] sandwich, intermittent crying episodes

## 2014-03-15 NOTE — ED Notes (Signed)
Patient has calmed down since moving patient to another room. Pt is asking for something to drink and eat. Informed pt in calm manner that if she was able to lay still for CT, have negative results, she could have fluids and something to eat. Pt is in agreeable to going to CT for scan. Notified CT that patient is ready for scan.

## 2014-03-15 NOTE — Progress Notes (Signed)
Patient ID: [name], female   DOB: 1990-11-08, 24 y.o.   MRN: [id number] PER STATE REGULATIONS 482.30  THIS CHART WAS REVIEWED FOR MEDICAL NECESSITY WITH RESPECT TO THE PATIENT'S ADMISSION/ DURATION OF STAY.  NEXT REVIEW DATE: 03/17/2014  Chauncy Lean, RN, BSN CASE MANAGER
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
## 2014-03-15 NOTE — ED Notes (Signed)
Applied soft posey restraint due to patient attempting sit up on the stretcher and wiggle herself to the end of the bed. [facility] police officers still at bedside. Pt has sitter at bedside and in view of nursing station. She continues to scream loudly and attempt to get of of restraints.

## 2014-03-15 NOTE — Progress Notes (Addendum)
D:  Patient denied SI and HI.  Denied A/V hallucinations.  Denied pain. A:  Medications administered per MD orders.  Emotional support and encouragement given patient. R:  Safety maintained with 15 minute checks.  Patient's self inventory sheet, patient slept good after 4 a.m. Medication last night.  Poor appetite, low energy level, good concentration.  Rated depression 4, denied hopeless, anxiety 8.  Denied withdrawals.  Denied SI.  Headache #6.  Goal is to go to groups and not have anxiety attack.  Plans to talk to MD today.  Plans to return to group home. Patient has slept most of day.  Staff has tried repeatedly to wake up patient to go to groups and go to dining room for meals.  MD gave permission to lock patient's door to keep her out of her room.  However, patient would not get out of bed until late afternoon.

## 2014-03-15 NOTE — ED Notes (Signed)
Patient is requesting Advil for headache. Informed patient she could not take Advil due to not being in time parameters. Offered Tylenol but she refused. She reports she would like to wait until Advil can be given.

## 2014-03-15 NOTE — Tx Team (Signed)
  Interdisciplinary Treatment Plan Update   Date Reviewed:  03/15/2014  Time Reviewed:  8:22 AM  Progress in Treatment:   Attending groups: Yes Participating in groups: Yes Taking medication as prescribed: Yes  Tolerating medication: Yes Family/Significant other contact made: Yes  Patient understands diagnosis: Yes  Discussing patient identified problems/goals with staff: Yes  See initial care plan Medical problems stabilized or resolved: Yes Denies suicidal/homicidal ideation: Yes  In tx team Patient has not harmed self or others: Yes  For review of initial/current patient goals, please see plan of care.  Estimated Length of Stay:  4-5 days  Reason for Continuation of Hospitalization: Depression Medication stabilization  New Problems/Goals identified:  N/A  Discharge Plan or Barriers:   unknown; working with guardian to determine  Additional Comments:  "I want to be in bed , I just got a shot at 5 am." . Patient continues to be withdrawn with limited participation in therapeutic milieu. Did not wanted to turn or communicate much. Will attempt to talk to her again later today. Discussed plan with staff to limit the use of BZD as well as to possibly restrict patients access to her room during day time, since she is always in bed ,with no participation in milieu. Patient with anxiety attacks as well as acting out behavior usually at night. This could be due to her being in bed the whole day. Patient was not on a sleep medication due to the same reason. However will try to keep her out of her room today and will make available a sleep aid as prn for tonight.   Patient cannot have any medication that can prolong QTC ,which includes antipsychotics.  Attendees:  Signature: Steva Colder, MD 03/15/2014 8:22 AM   Signature: Ripley Fraise, LCSW 03/15/2014 8:22 AM  Signature: Elmarie Shiley, NP 03/15/2014 8:22 AM  Signature: Grayland Ormond, RN 03/15/2014 8:22 AM  Signature:  03/15/2014 8:22 AM   Signature:  03/15/2014 8:22 AM  Signature:   03/15/2014 8:22 AM  Signature:    Signature:    Signature:    Signature:    Signature:    Signature:      Scribe for Treatment Team:   Ripley Fraise, LCSW  03/15/2014 8:22 AM

## 2014-03-15 NOTE — [facility] Group Notes (Signed)
[facility] LCSW Aftercare Discharge Planning Group Note   03/15/2014 4:14 PM  Participation Quality:  Invited.  Chose to not attend    [place], [name] B

## 2014-03-15 NOTE — Discharge Instructions (Signed)
Head Injury  ° °You have received a head injury. It does not appear serious at this time. Headaches and vomiting are common following head injury. It should be easy to awaken from sleeping. Sometimes it is necessary for you to stay in the emergency department for a while for observation. Sometimes admission to the hospital may be needed. After injuries such as yours, most problems occur within the first 24 hours, but side effects may occur up to 7-10 days after the injury. It is important for you to carefully monitor your condition and contact your health care provider or seek immediate medical care if there is a change in your condition.  °WHAT ARE THE TYPES OF HEAD INJURIES?  °Head injuries can be as minor as a bump. Some head injuries can be more severe. More severe head injuries include:  °A jarring injury to the brain (concussion).  °A bruise of the brain (contusion). This mean there is bleeding in the brain that can cause swelling.  °A cracked skull (skull fracture).  °Bleeding in the brain that collects, clots, and forms a bump (hematoma). °WHAT CAUSES A HEAD INJURY?  °A serious head injury is most likely to happen to someone who is in a car wreck and is not wearing a seat belt. Other causes of major head injuries include bicycle or motorcycle accidents, sports injuries, and falls.  °HOW ARE HEAD INJURIES DIAGNOSED?  °A complete history of the event leading to the injury and your current symptoms will be helpful in diagnosing head injuries. Many times, pictures of the brain, such as CT or MRI are needed to see the extent of the injury. Often, an overnight hospital stay is necessary for observation.  °WHEN SHOULD I SEEK IMMEDIATE MEDICAL CARE?  °You should get help right away if:  °You have confusion or drowsiness.  °You feel sick to your stomach (nauseous) or have continued, forceful vomiting.  °You have dizziness or unsteadiness that is getting worse.  °You have severe, continued headaches not relieved by  medicine. Only take over-the-counter or prescription medicines for pain, fever, or discomfort as directed by your health care provider.  °You do not have normal function of the arms or legs or are unable to walk.  °You notice changes in the black spots in the center of the colored part of your eye (pupil).  °You have a clear or bloody fluid coming from your nose or ears.  °You have a loss of vision. °During the next 24 hours after the injury, you must stay with someone who can watch you for the warning signs. This person should contact local emergency services (911 in the [place] if you have seizures, you become unconscious, or you are unable to wake up.  °HOW CAN I PREVENT A HEAD INJURY IN THE FUTURE?  °The most important factor for preventing major head injuries is avoiding motor vehicle accidents. To minimize the potential for damage to your head, it is crucial to wear seat belts while riding in motor vehicles. Wearing helmets while bike riding and playing collision sports (like football) is also helpful. Also, avoiding dangerous activities around the house will further help reduce your risk of head injury.  °WHEN CAN I RETURN TO NORMAL ACTIVITIES AND ATHLETICS?  °You should be reevaluated by your health care provider before returning to these activities. If you have any of the following symptoms, you should not return to activities or contact sports until 1 week after the symptoms have stopped:  °Persistent headache.  °Dizziness   or vertigo.  °Poor attention and concentration.  °Confusion.  °Memory problems.  °Nausea or vomiting.  °Fatigue or tire easily.  °Irritability.  °Intolerant of bright lights or loud noises.  °Anxiety or depression.  °Disturbed sleep. °MAKE SURE YOU:  °Understand these instructions.  °Will watch your condition.  °Will get help right away if you are not doing well or get worse. °Document Released: 01/22/2005 Document Revised: 01/27/2013 Document Reviewed: 09/29/2012  °ExitCare® Patient  Information ©2015 [employer]. This information is not intended to replace advice given to you by your health care provider. Make sure you discuss any questions you have with your health care provider.  ° ° °

## 2014-03-15 NOTE — BHH Group Notes (Signed)
[facility] LCSW Group Therapy  03/15/2014 1:15 pm  Type of Therapy: Process Group Therapy  Participation Level:  Invited  Chose to not attend    Modes of Intervention:  Activity, Clarification, Education, Problem-solving and Support  Summary of Progress/Problems: Today's group addressed the issue of overcoming obstacles.  Patients were asked to identify their biggest obstacle post d/c that stands in the way of their on-going success, and then problem solve as to how to manage this.  Roque Lias B 03/15/2014   4:14 PM

## 2014-03-15 NOTE — Progress Notes (Signed)
1726  Patient became very upset, agitated after SW told her about discharge arrangements.  Patient screaming, crying, would not calm down.  Many staff members attempted to comfort patient verbally.  Patient sat in floor, and then laid in floor, continuing to scream, yell, crying, pulling hair, pulling at her face.  Hands on manual hold at 1726.   Medication IM administered ativan 2 mg per MD order.  Staff holding legs were Ryta and Richardson Landry.  Arms held by [place].  Noelle guarding with towel to keep patient from spitting on staff.  Tanna Savoy, [place], [place] were monitoring situation.  Aggie and Mickel Baas present.  Patient continued to spit at staff.   1735  Attempted to release patient but patient continued kicking staff.  Patrice and Ryta holding arms.  Asa Lente and Steve holding legs.  Patient continued to say "I want to kill myself, I want to die fast, give me all the pills you have." 1745  Dr. Sabra Heck continues to monitor situation. 1747  Attempted to release patient.  Patient crawled on floor, began hitting staff.  Rehold occurred. 1750  Restraints being applied. [place] applied. [place] on gurney. [place] room. [place]  Medication administered IM 2 mg Ativan per MD orders. 1759  Restraints removed, seclusion.  Patient bags head on wall/window, screaming.  Continues to knock/bang head on door window of seclusion room.  Repeated requests by staff to stop banging her head and fists on door/window. 1802  Seclusion ended.  Manual hold.  Repeatedly staeted "what are you doing to me".  Aniceto Boss and Patrice hold arms.  Danny and Ryta hold legs. 1803  Restraining patient continues. 1806 Edison Pace no longer present. 1807  Patient stated "Please don't call the police, the police hurt me." [id number]  Staff asks "What is going to help you calm down?"  "Don't give me haldol, I'm allergic." 1812  "I want my stuff if I'm going to be discharged." 1813  Patient  kicks, screams continue. 1818   "You need to shut up and get out of my face."  Patient told police.  Patient spit into one of the policeman's mouth.] 9357  Patient told the police "You will have a broken arm." 1825  Closed doors seclusion. 1830  Banging head, cursing at staff, more police arrive. 0177  Police went into seclusion room and restrained patient with cuffs.   1842  Patient in handcuffs, still on floor with police present. [id number]  EMS arrived. 1850  Patient on gurney, police and EMS take patient to [facility] ED.

## 2014-03-15 NOTE — Progress Notes (Signed)
PPD NEGATIVE ON 03/15/2014 AT 1600.

## 2014-03-15 NOTE — ED Provider Notes (Signed)
CSN: [id number]     Arrival date & time 03/15/14  1907 History   First MD Initiated Contact with Patient 03/15/14 1908     No chief complaint on file.    (Consider location/radiation/quality/duration/timing/severity/associated sxs/prior Treatment) HPI Comments: Level V caveat secondary to acute agitation and psychiatric disturbance.  Patient is a 24 y.o. female presenting with mental health disorder.  Mental Health Problem Presenting symptoms: agitation and suicidal thoughts   Presenting symptoms comment:  Banging head into a wall Patient accompanied by:  Law enforcement Degree of incapacity (severity):  Severe Onset quality:  Gradual Duration: she has [facility] behavioral [facility] for several days ,but became acutely agitated today. Timing:  Constant Progression:  Worsening Context comment:  She reports becoming upset while she was talking with social work. Relieved by:  Nothing Ineffective treatments: 4 mg of Ativan.   Past Medical History  Diagnosis Date  . Asthma   . Depression   . Schizoaffective disorder, bipolar type   . Autism   . Essential tremor   . Chronic constipation   . Acid reflux    Past Surgical History  Procedure Laterality Date  . Mouth surgery     Family History  Problem Relation Age of Onset  . Mental illness Father   . PDD Brother   . Seizures Brother    History  Substance Use Topics  . Smoking status: Never Smoker   . Smokeless tobacco: Not on file  . Alcohol Use: No   OB History    No data available     Review of Systems  Unable to perform ROS: Psychiatric disorder  Psychiatric/Behavioral: Positive for suicidal ideas and agitation.      Allergies  Coconut flavor; Geodon; Haloperidol and related; Lithium; Oxycodone; Prilosec; and Seroquel  Home Medications   Prior to Admission medications   Medication Sig Start Date End Date Taking? Authorizing Provider  clozapine (CLOZARIL) 200 MG tablet Take 200  mg by mouth at bedtime.   Yes Historical Provider, MD  docusate sodium (COLACE) 100 MG capsule Take 100 mg by mouth 2 (two) times daily.   Yes Historical Provider, MD  esomeprazole (NEXIUM) 40 MG capsule Take 40 mg by mouth daily at 12 noon.   Yes Historical Provider, MD  FLUoxetine (PROZAC) 40 MG capsule Take 1 capsule (40 mg total) by mouth every morning. 01/09/14  Yes Benjamine Mola, FNP  ibuprofen (ADVIL,MOTRIN) 800 MG tablet Take 800 mg by mouth every 8 (eight) hours as needed for moderate pain.   Yes Historical Provider, MD  IRON PO Take 1 tablet by mouth daily before supper.    Yes Historical Provider, MD  metoprolol tartrate (LOPRESSOR) 25 MG tablet Take 1 tablet (25 mg total) by mouth 2 (two) times daily. 01/09/14  Yes Benjamine Mola, FNP  ranitidine (ZANTAC) 150 MG tablet Take 150 mg by mouth 2 (two) times daily.   Yes Historical Provider, MD  traZODone (DESYREL) 50 MG tablet Take 1 tablet (50 mg total) by mouth at bedtime as needed for sleep. 01/09/14  Yes Elyse Jarvis [place], FNP   BP 113/53 mmHg  Pulse 82  Temp(Src) 98.6 F (37 C) (Oral)  Resp 17  Ht 5\' 3"  (1.6 m)  Wt 189 lb (85.73 kg)  BMI 33.49 kg/m2  SpO2 100%  LMP 02/18/2014 (Within Days) Physical Exam  Constitutional: She appears well-developed and well-nourished. No distress.  HENT:  Head: Normocephalic and atraumatic.    Mouth/Throat: Oropharynx is clear  and moist.  Eyes: Conjunctivae are normal. Pupils are equal, round, and reactive to light. No scleral icterus.  Neck: Neck supple.  Cardiovascular: Normal rate, regular rhythm, normal heart sounds and intact distal pulses.   No murmur heard. Pulmonary/Chest: Effort normal and breath sounds normal. No stridor. No respiratory distress. She has no rales.  Abdominal: Soft. Bowel sounds are normal. She exhibits no distension. There is no tenderness.  Musculoskeletal: Normal range of motion.  Neurological: She is alert.  Moving all extremities  Skin: Skin is warm and dry. No  rash noted.  Psychiatric: Her affect is angry. She is agitated and aggressive. She expresses suicidal ideation.  Nursing note and vitals reviewed.   ED Course  CRITICAL CARE Performed by: Cyd Silence DAVID Authorized by: Cyd Silence DAVID Total critical care time: 35 minutes Critical care time was exclusive of separately billable procedures and treating other patients. Critical care was necessary to treat or prevent imminent or life-threatening deterioration of the following conditions: Psychiatric disturbance. Critical care was time spent personally by me on the following activities: development of treatment plan with patient or surrogate, evaluation of patient's response to treatment, examination of patient, obtaining history from patient or surrogate, ordering and performing treatments and interventions, ordering and review of laboratory studies, ordering and review of radiographic studies, pulse oximetry, re-evaluation of patient's condition and review of old charts.   (including critical care time) Labs Review Labs Reviewed  CBC WITH DIFFERENTIAL/PLATELET - Abnormal; Notable for the following:    WBC 11.5 (*)    Hemoglobin 11.7 (*)    Neutro Abs 8.7 (*)    All other components within normal limits  URINALYSIS, ROUTINE W REFLEX MICROSCOPIC - Abnormal; Notable for the following:    Hgb urine dipstick MODERATE (*)    Ketones, ur 40 (*)    All other components within normal limits  MAGNESIUM  HIV ANTIBODY (ROUTINE TESTING)  TSH  COMPREHENSIVE METABOLIC PANEL  PREGNANCY, URINE  URINE MICROSCOPIC-ADD ON  RPR  GC/CHLAMYDIA PROBE AMP ([place])    Imaging Review Ct Head Wo Contrast  03/15/2014   CLINICAL DATA:  Initial evaluation for acute trauma.  Struck head.  EXAM: CT HEAD WITHOUT CONTRAST  TECHNIQUE: Contiguous axial images were obtained from the base of the skull through the vertex without intravenous contrast.  COMPARISON:  Prior CT from 03/12/2009  FINDINGS:  There is no acute intracranial hemorrhage or infarct. No mass lesion or midline shift. Gray-white matter differentiation is well maintained. Ventricles are normal in size without evidence of hydrocephalus. CSF containing spaces are within normal limits. No extra-axial fluid collection.  The calvarium is intact.  Orbital soft tissues are within normal limits.  The paranasal sinuses and mastoid air cells are well pneumatized and free of fluid.  Small contusion present at the central forehead.  IMPRESSION: 1. No acute intracranial process. 2. Small central forehead contusion.   Electronically Signed   By: Jeannine Boga M.D.   On: 03/15/2014 22:56  ,All radiology studies independently viewed by me.      EKG Interpretation None      MDM   Final diagnoses:  Head injury, initial encounter  Agitation  Suicidal ideation    24 year old female who presented in an acutely agitated state after obtaining her head repeatedly against a wall while an inpatient psychiatric ward. She required high dose of benzodiazepines to control her agitation.  Review of past EKGs showed prolonged QTc, limiting treatment options.  Ultimately, ativan settled her  down.  CT imaging of her head revealed no acute intracranial or calvarial injuries.  She appeared to be appropriate for continued inpatient psychiatric care at [facility].      Houston Siren III, MD 03/16/14 [phone]

## 2014-03-16 ENCOUNTER — Other Ambulatory Visit: Payer: Self-pay

## 2014-03-16 ENCOUNTER — Encounter (HOSPITAL_COMMUNITY): Payer: Self-pay | Admitting: Emergency Medicine

## 2014-03-16 ENCOUNTER — Emergency Department (EMERGENCY_DEPARTMENT_HOSPITAL)
Admission: EM | Admit: 2014-03-16 | Discharge: 2014-03-17 | Disposition: A | Discharge status: Other Acute Inpatient Hospital | Payer: Medicare Other | Source: Home / Self Care | Attending: Emergency Medicine | Admitting: Emergency Medicine

## 2014-03-16 DIAGNOSIS — F989 Unspecified behavioral and emotional disorders with onset usually occurring in childhood and adolescence: Secondary | ICD-10-CM | POA: Diagnosis not present

## 2014-03-16 DIAGNOSIS — I4581 Long QT syndrome: Secondary | ICD-10-CM

## 2014-03-16 DIAGNOSIS — K5900 Constipation, unspecified: Secondary | ICD-10-CM | POA: Insufficient documentation

## 2014-03-16 DIAGNOSIS — J45909 Unspecified asthma, uncomplicated: Secondary | ICD-10-CM | POA: Insufficient documentation

## 2014-03-16 DIAGNOSIS — F1310 Sedative, hypnotic or anxiolytic abuse, uncomplicated: Secondary | ICD-10-CM | POA: Insufficient documentation

## 2014-03-16 DIAGNOSIS — F29 Unspecified psychosis not due to a substance or known physiological condition: Secondary | ICD-10-CM | POA: Diagnosis not present

## 2014-03-16 DIAGNOSIS — F4310 Post-traumatic stress disorder, unspecified: Secondary | ICD-10-CM | POA: Diagnosis not present

## 2014-03-16 DIAGNOSIS — F840 Autistic disorder: Secondary | ICD-10-CM

## 2014-03-16 DIAGNOSIS — Z79899 Other long term (current) drug therapy: Secondary | ICD-10-CM

## 2014-03-16 DIAGNOSIS — F919 Conduct disorder, unspecified: Secondary | ICD-10-CM

## 2014-03-16 DIAGNOSIS — R911 Solitary pulmonary nodule: Secondary | ICD-10-CM | POA: Diagnosis not present

## 2014-03-16 DIAGNOSIS — M25511 Pain in right shoulder: Secondary | ICD-10-CM | POA: Diagnosis not present

## 2014-03-16 DIAGNOSIS — F329 Major depressive disorder, single episode, unspecified: Secondary | ICD-10-CM

## 2014-03-16 DIAGNOSIS — F259 Schizoaffective disorder, unspecified: Secondary | ICD-10-CM | POA: Diagnosis not present

## 2014-03-16 DIAGNOSIS — F845 Asperger's syndrome: Secondary | ICD-10-CM | POA: Diagnosis not present

## 2014-03-16 DIAGNOSIS — K219 Gastro-esophageal reflux disease without esophagitis: Secondary | ICD-10-CM

## 2014-03-16 DIAGNOSIS — F250 Schizoaffective disorder, bipolar type: Secondary | ICD-10-CM | POA: Diagnosis not present

## 2014-03-16 DIAGNOSIS — S4991XA Unspecified injury of right shoulder and upper arm, initial encounter: Secondary | ICD-10-CM | POA: Diagnosis not present

## 2014-03-16 DIAGNOSIS — R110 Nausea: Secondary | ICD-10-CM | POA: Diagnosis not present

## 2014-03-16 DIAGNOSIS — F603 Borderline personality disorder: Secondary | ICD-10-CM | POA: Diagnosis not present

## 2014-03-16 DIAGNOSIS — R109 Unspecified abdominal pain: Secondary | ICD-10-CM | POA: Diagnosis not present

## 2014-03-16 LAB — CBC WITH DIFFERENTIAL/PLATELET
Basophils Absolute: 0 10*3/uL (ref 0.0–0.1)
Basophils Relative: 0 % (ref 0–1)
EOS PCT: 0 % (ref 0–5)
Eosinophils Absolute: 0 10*3/uL (ref 0.0–0.7)
HCT: 35.1 % — ABNORMAL LOW (ref 36.0–46.0)
Hemoglobin: 11.2 g/dL — ABNORMAL LOW (ref 12.0–15.0)
Lymphocytes Relative: 25 % (ref 12–46)
Lymphs Abs: 2.5 10*3/uL (ref 0.7–4.0)
MCH: 26.7 pg (ref 26.0–34.0)
MCHC: 31.9 g/dL (ref 30.0–36.0)
MCV: 83.6 fL (ref 78.0–100.0)
MONOS PCT: 6 % (ref 3–12)
Monocytes Absolute: 0.6 10*3/uL (ref 0.1–1.0)
Neutro Abs: 7 10*3/uL (ref 1.7–7.7)
Neutrophils Relative %: 69 % (ref 43–77)
Platelets: 242 10*3/uL (ref 150–400)
RBC: 4.2 MIL/uL (ref 3.87–5.11)
RDW: 13.9 % (ref 11.5–15.5)
WBC: 10.1 10*3/uL (ref 4.0–10.5)

## 2014-03-16 LAB — COMPREHENSIVE METABOLIC PANEL
ALBUMIN: 4.1 g/dL (ref 3.5–5.2)
ALK PHOS: 65 U/L (ref 39–117)
ALT: 16 U/L (ref 0–35)
AST: 29 U/L (ref 0–37)
Anion gap: 8 (ref 5–15)
BUN: 12 mg/dL (ref 6–23)
CHLORIDE: 109 mmol/L (ref 96–112)
CO2: 22 mmol/L (ref 19–32)
Calcium: 9 mg/dL (ref 8.4–10.5)
Creatinine, Ser: 0.69 mg/dL (ref 0.50–1.10)
GFR calc Af Amer: >90 mL/min (ref >90)
GFR calc non Af Amer: >90 mL/min (ref >90)
GLUCOSE: 102 mg/dL — AB (ref 70–99)
POTASSIUM: 3.9 mmol/L (ref 3.5–5.1)
SODIUM: 139 mmol/L (ref 135–145)
TOTAL PROTEIN: 6.9 g/dL (ref 6.0–8.3)
Total Bilirubin: 0.4 mg/dL (ref 0.3–1.2)

## 2014-03-16 LAB — RAPID URINE DRUG SCREEN, HOSP PERFORMED
Amphetamines: NOT DETECTED
BARBITURATES: NOT DETECTED
BENZODIAZEPINES: POSITIVE — AB
COCAINE: NOT DETECTED
OPIATES: NOT DETECTED
Tetrahydrocannabinol: NOT DETECTED

## 2014-03-16 LAB — TROPONIN I: Troponin I: <0.03 ng/mL (ref <0.031)

## 2014-03-16 LAB — MAGNESIUM: MAGNESIUM: 1.8 mg/dL (ref 1.5–2.5)

## 2014-03-16 LAB — ETHANOL: Alcohol, Ethyl (B): <5 mg/dL (ref 0–9)

## 2014-03-16 LAB — PHOSPHORUS: PHOSPHORUS: 2.6 mg/dL (ref 2.3–4.6)

## 2014-03-16 MED ORDER — CLONAZEPAM 0.5 MG PO TABS
1.0000 mg | ORAL_TABLET | Freq: Three times a day (TID) | ORAL | Status: DC | PRN
  Administered 2014-03-16 – 2014-03-17 (×2): 1 mg via ORAL
  Filled 2014-03-16 (×2): qty 1

## 2014-03-16 MED ORDER — CARBAMAZEPINE ER 100 MG PO TB12
100.0000 mg | ORAL_TABLET | Freq: Two times a day (BID) | ORAL | Status: DC
  Administered 2014-03-16: 100 mg via ORAL
  Filled 2014-03-16 (×6): qty 1

## 2014-03-16 MED ORDER — DIPHENHYDRAMINE HCL 50 MG/ML IJ SOLN
50.0000 mg | Freq: Once | INTRAMUSCULAR | Status: AC
  Administered 2014-03-16: 50 mg via INTRAMUSCULAR
  Filled 2014-03-16: qty 1

## 2014-03-16 MED ORDER — METOPROLOL TARTRATE 25 MG PO TABS
25.0000 mg | ORAL_TABLET | Freq: Two times a day (BID) | ORAL | Status: DC
  Administered 2014-03-17 (×2): 25 mg via ORAL
  Filled 2014-03-16 (×3): qty 1

## 2014-03-16 MED ORDER — DIPHENHYDRAMINE HCL 50 MG/ML IJ SOLN
INTRAMUSCULAR | Status: AC
  Administered 2014-03-16: 13:00:00
  Filled 2014-03-16: qty 1

## 2014-03-16 MED ORDER — DOCUSATE SODIUM 100 MG PO CAPS
100.0000 mg | ORAL_CAPSULE | Freq: Two times a day (BID) | ORAL | Status: DC
  Administered 2014-03-16 – 2014-03-17 (×3): 100 mg via ORAL
  Filled 2014-03-16 (×5): qty 1

## 2014-03-16 MED ORDER — PANTOPRAZOLE SODIUM 40 MG PO TBEC
80.0000 mg | DELAYED_RELEASE_TABLET | Freq: Every day | ORAL | Status: DC
  Administered 2014-03-17: 80 mg via ORAL

## 2014-03-16 MED ORDER — PRAZOSIN HCL 1 MG PO CAPS
1.0000 mg | ORAL_CAPSULE | Freq: Every day | ORAL | Status: DC
  Administered 2014-03-16 – 2014-03-17 (×2): 1 mg via ORAL
  Filled 2014-03-16 (×3): qty 1

## 2014-03-16 MED ORDER — LORAZEPAM 2 MG/ML IJ SOLN
2.0000 mg | Freq: Once | INTRAMUSCULAR | Status: AC
  Administered 2014-03-16: 2 mg via INTRAMUSCULAR
  Filled 2014-03-16: qty 1

## 2014-03-16 MED ORDER — LAMOTRIGINE 25 MG PO TABS
50.0000 mg | ORAL_TABLET | Freq: Every day | ORAL | Status: DC
  Administered 2014-03-17 (×2): 50 mg via ORAL
  Filled 2014-03-16 (×2): qty 2

## 2014-03-16 MED ORDER — LORAZEPAM 2 MG/ML IJ SOLN
1.0000 mg | Freq: Four times a day (QID) | INTRAMUSCULAR | Status: DC | PRN
  Administered 2014-03-16: 1 mg via INTRAMUSCULAR

## 2014-03-16 MED ORDER — CLOZAPINE 100 MG PO TABS
200.0000 mg | ORAL_TABLET | Freq: Every day | ORAL | Status: DC
  Administered 2014-03-16: 200 mg via ORAL
  Filled 2014-03-16 (×2): qty 2

## 2014-03-16 MED ORDER — DIPHENHYDRAMINE HCL 50 MG/ML IJ SOLN
50.0000 mg | Freq: Four times a day (QID) | INTRAMUSCULAR | Status: DC | PRN
  Administered 2014-03-16: 50 mg via INTRAMUSCULAR

## 2014-03-16 MED ORDER — IBUPROFEN 200 MG PO TABS
600.0000 mg | ORAL_TABLET | Freq: Three times a day (TID) | ORAL | Status: DC | PRN
  Administered 2014-03-16 – 2014-03-17 (×4): 600 mg via ORAL
  Filled 2014-03-16 (×2): qty 3
  Filled 2014-03-16: qty 1
  Filled 2014-03-16 (×2): qty 3

## 2014-03-16 MED ORDER — LORAZEPAM 2 MG/ML IJ SOLN: 2.0000 mg | Freq: Once | INTRAMUSCULAR | Status: DC

## 2014-03-16 MED ORDER — TRAZODONE HCL 50 MG PO TABS: 50.0000 mg | ORAL_TABLET | Freq: Every evening | ORAL | Status: DC | PRN

## 2014-03-16 MED ORDER — CARBAMAZEPINE ER 100 MG PO TB12
100.0000 mg | ORAL_TABLET | Freq: Two times a day (BID) | ORAL | Status: DC
  Administered 2014-03-16 – 2014-03-17 (×3): 100 mg via ORAL
  Filled 2014-03-16 (×5): qty 1

## 2014-03-16 MED ORDER — ONDANSETRON HCL 4 MG PO TABS: 4.0000 mg | ORAL_TABLET | Freq: Three times a day (TID) | ORAL | Status: DC | PRN

## 2014-03-16 MED ORDER — FERROUS SULFATE 325 (65 FE) MG PO TABS
325.0000 mg | ORAL_TABLET | Freq: Every day | ORAL | Status: DC
  Administered 2014-03-17: 325 mg via ORAL
  Filled 2014-03-16: qty 1

## 2014-03-16 MED ORDER — HYDROXYZINE HCL 25 MG PO TABS
25.0000 mg | ORAL_TABLET | ORAL | Status: DC | PRN
  Administered 2014-03-17: 25 mg via ORAL
  Filled 2014-03-16: qty 1

## 2014-03-16 MED ORDER — LORAZEPAM 2 MG/ML IJ SOLN
INTRAMUSCULAR | Status: AC
  Filled 2014-03-16: qty 1

## 2014-03-16 MED ORDER — FAMOTIDINE 20 MG PO TABS
20.0000 mg | ORAL_TABLET | Freq: Two times a day (BID) | ORAL | Status: DC
  Administered 2014-03-17: 20 mg via ORAL
  Filled 2014-03-16 (×2): qty 1

## 2014-03-16 MED ORDER — ACETAMINOPHEN 325 MG PO TABS: 650.0000 mg | ORAL_TABLET | ORAL | Status: DC | PRN

## 2014-03-16 MED ORDER — LORAZEPAM 1 MG PO TABS
1.0000 mg | ORAL_TABLET | Freq: Three times a day (TID) | ORAL | Status: DC | PRN
  Administered 2014-03-16 – 2014-03-17 (×2): 1 mg via ORAL
  Filled 2014-03-16 (×3): qty 1

## 2014-03-16 NOTE — ED Notes (Signed)
RN at bedside to give medications when patient starting screaming and being combative. Was spitting in staff's faces. Patient was previously calm, cooperative. Was given fluids and was given the phone. Previously and explicitly explained to patient she was to remain calm if restraints removed. Patient is in four point restraints at this time with [employer] at the bedside and security. Moving all extremities. Mask reapplied. Explained to patient why she is restrained with mask on.

## 2014-03-16 NOTE — ED Notes (Signed)
Pt breathing WNL RR 20-24 VS 116/ 73, 91

## 2014-03-16 NOTE — ED Notes (Signed)
Bed: VZ85 Expected date:  Expected time:  Means of arrival:  Comments: psyche

## 2014-03-16 NOTE — BHH Group Notes (Signed)
[facility] LCSW Group Therapy  03/16/2014 , 12:38 PM   Type of Therapy:  Group Therapy  Participation Level:  Active  Participation Quality:  Attentive  Affect:  Appropriate  Cognitive:  Alert  Insight:  Improving  Engagement in Therapy:  Engaged  Modes of Intervention:  Discussion, Exploration and Socialization  Summary of Progress/Problems: Today's group focused on the term Diagnosis.  Participants were asked to define the term, and then pronounce whether it is a negative, positive or neutral term.  Mikalia came in very disruptive, and would not follow prompts so I kicked her out.  She refused to go, and pleaded to be given another chance, so another staff member I had enlisted for help advocated for her to stay.  I allowed it, and she was able to raise her hand and not blurt out.  She stayed engaged throughout.  Talked about how she is not getting the right medications "for my diagnosis and symptoms" but was unwilling/unable to problem solve around that.  Also talked about how she finds support by talking to her guardian and particular staff at the various group homes she has been.  Talked about diagnosis in terms of "labeling in a bad way" because others "point their fingers and laugh."  Was encouraged by other patients to not take that personally.  Trish Mage 03/16/2014 , 12:38 PM
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
## 2014-03-16 NOTE — ED Notes (Signed)
MD gave verbal consent/order for 4 point violent restraints.

## 2014-03-16 NOTE — Progress Notes (Signed)
Patient ID: [name], female   DOB: 09/10/1990, 24 y.o.   MRN: [id number] Called Dr Johnney Killian, EDP in reference to obtaining a cardiology consult for this patient whose QTC interval is prolonged.  Dr Johnney Killian stated that she already discussed the issue with Cardiologist Dr Lin Landsman.  Dr Lin Landsman has asserted that Cardiology will not be making any consult or medication advise based on QTC prolongation.  Instead, Dr Lin Landsman suggested that Psychiatry  Search QTC prolongation .com and chose a safe medication for patient.  Dr Dwyane Dee has been updated.  Dr Johnney Killian also stated that patient have been calm and cooperative since her arrival to the ER.  Charmaine Downs   PMHNP-BC
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
## 2014-03-16 NOTE — ED Notes (Signed)
Patient arrived from [name] and is currently screaming and being verbally abusive to staff and [employer]. Pt arrived currently arrived handcuffed by [employer]. No bruises/redness/abrasions noted under handcuffs. Pt is banging her legs and arms against the bed. Mask applied to patient because she is attempting to spit on staff and [employer]. Patient is still screaming through the mask but is able to breathe freely. SpO2 100% on RA. RR even/unlabored. Copies of IVC paperwork at the bedside. Moving all extremities equally. C/o head and right shoulder pain.

## 2014-03-16 NOTE — BH Assessment (Signed)
Writer informed TTS Erasmo Downer of the consult.

## 2014-03-16 NOTE — Progress Notes (Signed)
Patient ID: [name], female   DOB: 04-13-1990, 24 y.o.   MRN: [id number]  [facility], 09:30am: DID NOT ATTEND  The focus of this group is to educate the patient on the purpose and policies of crisis stabilization and provide a format to answer questions about their admission.  The group details unit policies and expectations of patients while admitted.

## 2014-03-16 NOTE — Progress Notes (Signed)
Patient ID: ARLYS SCATENA, female   DOB: Apr 06, 1990, 24 y.o.   MRN: [id number] D: Client returned from ED, accompanied by [employer] and staff. Client still attentions seeking, "I want my medicine" whinnying. A: Writer provided emotional support,VS taken, administered medications as ordered. Staff will monitor q41min for safety. R: client is safe on the unit.

## 2014-03-16 NOTE — Clinical Social Work Note (Signed)
CSW contacted [facility] - has care coordinator - Junius Creamer [phone]). [facility] provided [facility] auth number - [id number].  CC advised that guardian, Gae Gallop, has good relationship w patient and has information related to attempts at housing and referral coordination for patient in community.  [place] referral faxed to facility, confirmed receipt.  Edwyna Shell, LCSW Clinical Social Worker
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
## 2014-03-16 NOTE — BH Assessment (Signed)
Writer informed the TTS Erasmo Downer that the patient does not need a consult because the patient was sent to the ED from [facility].   Writer spoke to the nurse and the patient will be able to take the consult out of the system.

## 2014-03-17 ENCOUNTER — Emergency Department (HOSPITAL_COMMUNITY): Payer: Medicare Other

## 2014-03-17 ENCOUNTER — Inpatient Hospital Stay (HOSPITAL_COMMUNITY): Admit: 2014-03-17 | Payer: Self-pay

## 2014-03-17 ENCOUNTER — Inpatient Hospital Stay (HOSPITAL_COMMUNITY)
Admission: AD | Admit: 2014-03-17 | Discharge: 2014-03-29 | DRG: 885 | Disposition: A | Discharge status: Other Acute Inpatient Hospital | Payer: Medicare Other | Source: Intra-hospital | Attending: Emergency Medicine | Admitting: Emergency Medicine

## 2014-03-17 DIAGNOSIS — R110 Nausea: Secondary | ICD-10-CM | POA: Diagnosis not present

## 2014-03-17 DIAGNOSIS — F845 Asperger's syndrome: Secondary | ICD-10-CM | POA: Diagnosis not present

## 2014-03-17 DIAGNOSIS — I4581 Long QT syndrome: Secondary | ICD-10-CM | POA: Diagnosis not present

## 2014-03-17 DIAGNOSIS — Z79899 Other long term (current) drug therapy: Secondary | ICD-10-CM

## 2014-03-17 DIAGNOSIS — K2970 Gastritis, unspecified, without bleeding: Secondary | ICD-10-CM | POA: Diagnosis not present

## 2014-03-17 DIAGNOSIS — R45851 Suicidal ideations: Secondary | ICD-10-CM | POA: Diagnosis present

## 2014-03-17 DIAGNOSIS — F99 Mental disorder, not otherwise specified: Secondary | ICD-10-CM | POA: Diagnosis not present

## 2014-03-17 DIAGNOSIS — R112 Nausea with vomiting, unspecified: Secondary | ICD-10-CM | POA: Diagnosis not present

## 2014-03-17 DIAGNOSIS — F4310 Post-traumatic stress disorder, unspecified: Secondary | ICD-10-CM | POA: Diagnosis not present

## 2014-03-17 DIAGNOSIS — F29 Unspecified psychosis not due to a substance or known physiological condition: Secondary | ICD-10-CM | POA: Diagnosis present

## 2014-03-17 DIAGNOSIS — F603 Borderline personality disorder: Secondary | ICD-10-CM | POA: Diagnosis not present

## 2014-03-17 DIAGNOSIS — M25511 Pain in right shoulder: Secondary | ICD-10-CM | POA: Diagnosis not present

## 2014-03-17 DIAGNOSIS — R109 Unspecified abdominal pain: Secondary | ICD-10-CM

## 2014-03-17 DIAGNOSIS — M79604 Pain in right leg: Secondary | ICD-10-CM | POA: Diagnosis not present

## 2014-03-17 DIAGNOSIS — S4991XA Unspecified injury of right shoulder and upper arm, initial encounter: Secondary | ICD-10-CM | POA: Diagnosis not present

## 2014-03-17 DIAGNOSIS — S46811A Strain of other muscles, fascia and tendons at shoulder and upper arm level, right arm, initial encounter: Secondary | ICD-10-CM | POA: Diagnosis not present

## 2014-03-17 DIAGNOSIS — M25519 Pain in unspecified shoulder: Secondary | ICD-10-CM | POA: Diagnosis not present

## 2014-03-17 DIAGNOSIS — F250 Schizoaffective disorder, bipolar type: Secondary | ICD-10-CM | POA: Diagnosis present

## 2014-03-17 DIAGNOSIS — R911 Solitary pulmonary nodule: Secondary | ICD-10-CM

## 2014-03-17 DIAGNOSIS — F259 Schizoaffective disorder, unspecified: Secondary | ICD-10-CM | POA: Diagnosis not present

## 2014-03-17 DIAGNOSIS — R52 Pain, unspecified: Secondary | ICD-10-CM

## 2014-03-17 LAB — RPR: RPR Ser Ql: NONREACTIVE

## 2014-03-17 MED ORDER — RISPERIDONE 0.5 MG PO TABS: 0.5000 mg | ORAL_TABLET | Freq: Every day | ORAL | Status: DC

## 2014-03-17 MED ORDER — LORAZEPAM 1 MG PO TABS
1.0000 mg | ORAL_TABLET | Freq: Once | ORAL | Status: AC
  Administered 2014-03-17: 1 mg via ORAL
  Filled 2014-03-17: qty 1

## 2014-03-17 MED ORDER — DIAZEPAM 5 MG/ML IJ SOLN
INTRAMUSCULAR | Status: AC
  Administered 2014-03-17: 5 mg
  Filled 2014-03-17: qty 2

## 2014-03-17 MED ORDER — PIPERACILLIN-TAZOBACTAM 3.375 G IVPB: 3.3750 g | Freq: Once | INTRAVENOUS | Status: DC

## 2014-03-17 MED ORDER — VANCOMYCIN HCL 10 G IV SOLR: 1500.0000 mg | Freq: Once | INTRAVENOUS | Status: DC

## 2014-03-17 MED ORDER — DIAZEPAM 5 MG PO TABS
5.0000 mg | ORAL_TABLET | Freq: Once | ORAL | Status: AC
  Administered 2014-03-17: 5 mg via ORAL
  Filled 2014-03-17: qty 1

## 2014-03-17 MED ORDER — IBUPROFEN 200 MG PO TABS
200.0000 mg | ORAL_TABLET | Freq: Once | ORAL | Status: AC
  Administered 2014-03-17: 200 mg via ORAL
  Filled 2014-03-17: qty 1

## 2014-03-17 MED ORDER — DIAZEPAM 5 MG/ML IJ SOLN: 5.0000 mg | Freq: Once | INTRAMUSCULAR | Status: DC

## 2014-03-17 NOTE — Progress Notes (Signed)
Pt brought back to the unit at 2130, pt searched, belongings checked and pt brought back on the unit. Pt information was not in the CPU until after 2220. Per PA pt will be on a 1:1
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
## 2014-03-17 NOTE — ED Notes (Signed)
Patient is currently sleeping, vitals will be assessed at a later time. Attending RN notified.

## 2014-03-17 NOTE — Clinical Social Work Note (Signed)
Opal Sidles from [facility] called, patient has been placed on wait list.  Wants copy of UDS if available.  Edwyna Shell, LCSW Clinical Social Worker

## 2014-03-17 NOTE — Progress Notes (Signed)
Pt was informed she was going to be placed on a 1 :1, pt became very emotional. Pt is very labile and very concrete in her thinking.
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
## 2014-03-17 NOTE — ED Notes (Signed)
Report called to RN Legrand Como, [facility], [employer] transport requested.

## 2014-03-17 NOTE — ED Provider Notes (Signed)
I have discussed the patient's care with the cardiologist Dr. Julianne Handler - whose advice has been greatly appreciated. The patient does not have a prolonged QT at this time, if needed for psychiatric medications the patient can have a repeat EKG which should be followed daily if the patient is inpatient. The patient can be transported back to behavioral health at this time.  Johnna Acosta, MD 03/17/14 [phone]

## 2014-03-17 NOTE — ED Notes (Signed)
Cardiologist came by to evaluate patient.  Suggested daily EKGs if psychotrophic medications are needed.
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
## 2014-03-17 NOTE — ED Notes (Signed)
Patient is up in the milieu.  Was able to complete an EKG on her.  Patient c/o head and right shoulder pain.  She denies any SI/HI/AVH.  She was compliant with her medications.  Patient is tearful about her behavior yesterday and states, "I don't remember much of it."  She has been cooperative asking for ice packs and medications.  Plan is for her to return to [facility].
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
## 2014-03-17 NOTE — Progress Notes (Signed)
Patient ID: [name], female   DOB: 1990/02/10, 24 y.o.   MRN: [id number] PER STATE REGULATIONS 482.30  THIS CHART WAS REVIEWED FOR MEDICAL NECESSITY WITH RESPECT TO THE PATIENT'S ADMISSION/ DURATION OF STAY.  NEXT REVIEW DATE: 03/21/2014  Chauncy Lean, RN, BSN CASE MANAGER

## 2014-03-17 NOTE — Progress Notes (Addendum)
Pt still not officially in the CPU. Pt was given 5 mg Valium per PA on duty at 2326.

## 2014-03-17 NOTE — Progress Notes (Signed)
CSW reached out to [facility] at [facility].   AC states that pt has a bed waiting and that staff will be sent to come and admit pt back to [facility].  Willette Brace [phone] ED CSW 03/17/2014 4:29 PM

## 2014-03-17 NOTE — ED Notes (Signed)
Pt returned from xray
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
## 2014-03-17 NOTE — ED Notes (Signed)
Pt AAO x 3, no acute distress noted, complaining of rt shoulder and facial pain.  Pt calm & cooperative at present.  Pending transfer to [facility].

## 2014-03-17 NOTE — ED Notes (Signed)
Patient transported to X-ray 

## 2014-03-18 ENCOUNTER — Encounter (HOSPITAL_COMMUNITY): Payer: Self-pay

## 2014-03-18 ENCOUNTER — Other Ambulatory Visit: Payer: Self-pay

## 2014-03-18 MED ORDER — ESCITALOPRAM OXALATE 20 MG PO TABS
20.0000 mg | ORAL_TABLET | Freq: Every day | ORAL | Status: DC
  Administered 2014-03-18 – 2014-03-28 (×11): 20 mg via ORAL
  Filled 2014-03-18: qty 1
  Filled 2014-03-18: qty 2
  Filled 2014-03-18 (×13): qty 1

## 2014-03-18 MED ORDER — TRAZODONE HCL 50 MG PO TABS
50.0000 mg | ORAL_TABLET | Freq: Every evening | ORAL | Status: DC | PRN
Start: 2014-03-18 — End: 2014-03-22
  Administered 2014-03-20: 50 mg via ORAL
  Filled 2014-03-18 (×2): qty 1

## 2014-03-18 MED ORDER — RISPERIDONE 0.5 MG PO TABS
0.5000 mg | ORAL_TABLET | Freq: Two times a day (BID) | ORAL | Status: DC
  Administered 2014-03-18 – 2014-03-21 (×6): 0.5 mg via ORAL
  Filled 2014-03-18 (×11): qty 1

## 2014-03-18 MED ORDER — IBUPROFEN 800 MG PO TABS
800.0000 mg | ORAL_TABLET | Freq: Three times a day (TID) | ORAL | Status: DC | PRN
Start: 2014-03-18 — End: 2014-03-30
  Administered 2014-03-18 – 2014-03-28 (×19): 800 mg via ORAL
  Filled 2014-03-18 (×20): qty 1

## 2014-03-18 MED ORDER — RISPERIDONE 1 MG PO TBDP
1.0000 mg | ORAL_TABLET | Freq: Every day | ORAL | Status: DC
  Administered 2014-03-18 – 2014-03-20 (×3): 1 mg via ORAL
  Filled 2014-03-18 (×6): qty 1

## 2014-03-18 MED ORDER — CLONAZEPAM 0.5 MG PO TABS
0.2500 mg | ORAL_TABLET | Freq: Two times a day (BID) | ORAL | Status: DC | PRN
  Administered 2014-03-18 – 2014-03-28 (×12): 0.25 mg via ORAL
  Filled 2014-03-18 (×14): qty 1

## 2014-03-18 MED ORDER — ALUM & MAG HYDROXIDE-SIMETH 200-200-20 MG/5ML PO SUSP: 30.0000 mL | ORAL | Status: DC | PRN

## 2014-03-18 MED ORDER — HYDROXYZINE HCL 25 MG PO TABS
25.0000 mg | ORAL_TABLET | ORAL | Status: DC | PRN
Start: 2014-03-18 — End: 2014-03-30
  Administered 2014-03-20 – 2014-03-27 (×5): 25 mg via ORAL
  Filled 2014-03-18 (×8): qty 1

## 2014-03-18 MED ORDER — PANTOPRAZOLE SODIUM 40 MG PO TBEC
40.0000 mg | DELAYED_RELEASE_TABLET | Freq: Every day | ORAL | Status: DC
  Administered 2014-03-18 – 2014-03-28 (×11): 40 mg via ORAL
  Filled 2014-03-18 (×15): qty 1

## 2014-03-18 MED ORDER — FERROUS SULFATE 325 (65 FE) MG PO TABS
325.0000 mg | ORAL_TABLET | Freq: Every day | ORAL | Status: DC
  Administered 2014-03-18 – 2014-03-28 (×10): 325 mg via ORAL
  Filled 2014-03-18 (×13): qty 1

## 2014-03-18 MED ORDER — MAGNESIUM HYDROXIDE 400 MG/5ML PO SUSP: 30.0000 mL | Freq: Every day | ORAL | Status: DC | PRN

## 2014-03-18 MED ORDER — CARBAMAZEPINE ER 100 MG PO TB12
100.0000 mg | ORAL_TABLET | Freq: Two times a day (BID) | ORAL | Status: DC
  Administered 2014-03-18: 100 mg via ORAL
  Filled 2014-03-18 (×4): qty 1

## 2014-03-18 MED ORDER — CLONAZEPAM 1 MG PO TABS
1.0000 mg | ORAL_TABLET | Freq: Every day | ORAL | Status: DC
  Administered 2014-03-18 – 2014-03-28 (×10): 1 mg via ORAL
  Filled 2014-03-18: qty 1
  Filled 2014-03-18: qty 2
  Filled 2014-03-18 (×10): qty 1

## 2014-03-18 MED ORDER — ACETAMINOPHEN 325 MG PO TABS
650.0000 mg | ORAL_TABLET | Freq: Four times a day (QID) | ORAL | Status: DC | PRN
Start: 2014-03-18 — End: 2014-03-30
  Administered 2014-03-23: 650 mg via ORAL
  Filled 2014-03-18: qty 2

## 2014-03-18 MED ORDER — PRAZOSIN HCL 1 MG PO CAPS
1.0000 mg | ORAL_CAPSULE | Freq: Once | ORAL | Status: AC
  Administered 2014-03-18: 1 mg via ORAL
  Filled 2014-03-18 (×2): qty 1

## 2014-03-18 MED ORDER — LAMOTRIGINE 25 MG PO TABS
50.0000 mg | ORAL_TABLET | Freq: Every day | ORAL | Status: DC
  Administered 2014-03-18 – 2014-03-28 (×11): 50 mg via ORAL
  Filled 2014-03-18 (×15): qty 2

## 2014-03-18 MED ORDER — CARBAMAZEPINE ER 200 MG PO TB12
200.0000 mg | ORAL_TABLET | Freq: Two times a day (BID) | ORAL | Status: DC
  Administered 2014-03-18 – 2014-03-28 (×20): 200 mg via ORAL
  Filled 2014-03-18 (×26): qty 1

## 2014-03-18 MED ORDER — METOPROLOL TARTRATE 25 MG PO TABS
25.0000 mg | ORAL_TABLET | Freq: Two times a day (BID) | ORAL | Status: DC
  Administered 2014-03-18 – 2014-03-28 (×18): 25 mg via ORAL
  Filled 2014-03-18 (×28): qty 1

## 2014-03-18 MED ORDER — DOCUSATE SODIUM 100 MG PO CAPS
100.0000 mg | ORAL_CAPSULE | Freq: Two times a day (BID) | ORAL | Status: DC
  Administered 2014-03-18 – 2014-03-21 (×7): 100 mg via ORAL
  Filled 2014-03-18 (×12): qty 1

## 2014-03-18 MED ORDER — BENZTROPINE MESYLATE 1 MG PO TABS: 1.0000 mg | ORAL_TABLET | Freq: Two times a day (BID) | ORAL | Status: DC | PRN

## 2014-03-18 NOTE — Progress Notes (Signed)
Pt calm and mood and affect has improved, not cursing or trying to spit on staff.

## 2014-03-18 NOTE — Progress Notes (Signed)
Pt continues to be focused on her medication. Pt stated she takes 800 mg of Ibuprofen at home. Pt is focused on the fact the doctor in the ER told her she could have Risperdal. Per Cardiologist note the Risperadal is to be given with continual EKG monitoring.
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
## 2014-03-18 NOTE — Progress Notes (Signed)
Due to pt not being transferred correctly by the ER, pt admission had to be done over in the computer. Pt paperwork needs to be signed.

## 2014-03-18 NOTE — Progress Notes (Signed)
Patient Discharge Instructions:  Next Level Care Provider Has Access to the EMR, 03/18/14  The patient was readmitted to [facility].  The entire medical record is made available via CH[facility]/Epic access.  [name], 03/18/2014, 3:36 PM
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
## 2014-03-18 NOTE — Progress Notes (Signed)
Spoke with NP on call, and was given a 1 time order for mini-press to help with her nightmares. Per np benefits out weigh risk at this point.
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
## 2014-03-18 NOTE — Progress Notes (Signed)
Nursing 1:1 note D:Pt observed sleeping in bed with eyes closed. RR even and unlabored. No distress noted. A: 1:1 observation continues for safety  R: pt remains safe  
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
## 2014-03-18 NOTE — Progress Notes (Addendum)
D: Patient crying this afternoon when asked to leave group due to gagging. Patient tearful feeling that the group leader did not like her. Given klonopin and vistaril prn and it was effective. Patient was able to maintain her behavior without cirting. No aggression or self harm behaviors. A: Patient changed to continious observation due to behavior better today.  R: Patient needed some redirection but was cooperative.

## 2014-03-18 NOTE — Tx Team (Signed)
  Interdisciplinary Treatment Plan Update   Date Reviewed:  03/18/2014  Time Reviewed:  8:26 AM  Progress in Treatment:   Attending groups: Yes Participating in groups: Yes Taking medication as prescribed: Yes  Tolerating medication: Yes Family/Significant other contact made: Yes  Patient understands diagnosis: Yes  Discussing patient identified problems/goals with staff: Yes  See initial care plan Medical problems stabilized or resolved: Yes Denies suicidal/homicidal ideation: Yes  In tx team Patient has not harmed self or others: Yes  For review of initial/current patient goals, please see plan of care.  Estimated Length of Stay:  4-5 days  Reason for Continuation of Hospitalization: Medication stabilization Other; describe mood lability  New Problems/Goals identified:  N/A  Discharge Plan or Barriers:   referred to [facility]  Additional Comments:  Updated clinicals faxed to [facility] w guardian's consent. Update given to [facility], per Marlowe Kays, patient is on "good" list, no estimated date of admission available. This is based on two separate issues of head banging/tantrumming on the unit that necessitated sending patient to ED for medical clearance.  Attendees:  Signature: Steva Colder, MD 03/18/2014 8:26 AM   Signature: Ripley Fraise, LCSW 03/18/2014 8:26 AM  Signature:  03/18/2014 8:26 AM  Signature: , RN 03/18/2014 8:26 AM  Signature:  03/18/2014 8:26 AM  Signature:  03/18/2014 8:26 AM  Signature:   03/18/2014 8:26 AM  Signature:    Signature:    Signature:    Signature:    Signature:    Signature:      Scribe for Treatment Team:   Ripley Fraise, LCSW  03/18/2014 8:26 AM

## 2014-03-18 NOTE — BHH Group Notes (Signed)
[place] Group Notes:  (Nursing/MHT/Case Management/Adjunct)  Date:  03/18/2014  Time:  0900  Type of Therapy:  Nurse Education  Participation Level:  Active  Participation Quality:  Appropriate  Affect:  Appropriate  Cognitive:  Alert and Appropriate  Insight:  Lacking  Engagement in Group:  Poor  Modes of Intervention:  Problem-solving and Support  Summary of Progress/Problems: Goals and rules  Franciso Bend 03/18/2014, 1:55 PM

## 2014-03-18 NOTE — Clinical Social Work Note (Signed)
Updated clinicals faxed to [facility] w guardian's consent.  Update given to [facility] at [facility], per Marlowe Kays, patient is on "good" list, no estimated date of admission available.    Edwyna Shell, LCSW Clinical Social Worker
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
## 2014-03-18 NOTE — Progress Notes (Signed)
Patient ID: [name], female   DOB: Aug 24, 1990, 24 y.o.   MRN: [id number]  D: Patient calm but anxious on approach. Concerned about medications but talked them over with doctor and changes made. Reports that her goal is to be calm and not act out today. Gives depression "6" on scale, hopelessness "3" and anxiety "7". Denies any SI but states she still hears voices at times. No agitation toward staff thus far today. Patient apologizing for behavior on previous days but appears to have liked the negative attention as she keeps talking about it.  A: Staff will continue on 1:1 monitoring due to aggressive behaviors this week. R: Taking medication as ordered. Cooperating with staff this am.
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
## 2014-03-18 NOTE — Progress Notes (Signed)
D: Patient has been drowsy this afternoon. No inappropriate behavior observed. Argued with another patient briefly today but was able to maintain her own behavior except saying a few curse words.  A: Staff will continue to monitor on continuous observation due to previous aggressive behavior and self harm behavior. R: Cooperative at this time.

## 2014-03-18 NOTE — Progress Notes (Signed)
D: unable to assess pt SI/HI/AVH. Pt will not answer questions because she was only focused on her medications. Pt is labile, emotional and has a hard time processing information. Pt constantly needs to be re-directed.  A: Pt was offered support and encouragement. Pt was given scheduled medications.  Q 15 minute checks were done for safety.  R: Pt is taking medication.
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
## 2014-03-19 MED ORDER — ENSURE COMPLETE PO LIQD
237.0000 mL | Freq: Three times a day (TID) | ORAL | Status: DC
  Administered 2014-03-19 – 2014-03-26 (×17): 237 mL via ORAL
  Filled 2014-03-19 (×3): qty 237

## 2014-03-19 MED ORDER — PRAZOSIN HCL 1 MG PO CAPS
1.0000 mg | ORAL_CAPSULE | Freq: Every day | ORAL | Status: DC
Start: 2014-03-19 — End: 2014-03-30
  Administered 2014-03-19 – 2014-03-28 (×9): 1 mg via ORAL
  Filled 2014-03-19 (×16): qty 1
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
## 2014-03-19 NOTE — Progress Notes (Signed)
D:  Per pt self inventory pt reports sleeping good last night, appetite poor over the past 24 hours, energy level normal, ability to pay attention good, rates depression at a 4 out of 10 and hopelessness at an 3 out of 10, anxiety at an 8 out, denies SI/HI/AVH, goal for today is "controlling my anger" and pt plans to "color and talk to staff" to help her achieve this goal today, so far for this shift pt has been cooperative and calm, she does has a sitter at close observation, pt did notify staff when her anxiety was increasing and staff were able to give prescribed prn antianxiety medication per orders which helped the patient feel better and bring her anxiety down to a more manageable level.  A:  Emotional support provided, Encouraged pt to continue with treatment plan and attend all group activities, q15 min checks maintained for safety, pt started on ensure nutritional supplement today and she is tolerating them well.  R:  Pt has been calm and cooperative, has gone to a couple of groups today but has isolated in the room for the majority of the day, appropriate with staff and other patients.

## 2014-03-19 NOTE — BHH Group Notes (Signed)
Adult Psychoeducational Group Note  Date:  03/19/2014 Time:  9:57 PM  Group Topic/Focus:  Wrap-Up Group:   The focus of this group is to help patients review their daily goal of treatment and discuss progress on daily workbooks.  Participation Level:  Did Not Attend  Participation Quality:  None  Affect:  None  Cognitive:  None  Insight: None  Engagement in Group:  None  Modes of Intervention:  Discussion  Additional Comments:  Taylor did not attend group.  Victorino Sparrow A 03/19/2014, 9:57 PM

## 2014-03-19 NOTE — Progress Notes (Signed)
D: Pt denies SI/HI/AVH. Pt is pleasant and cooperative. Pt continues to apologize for her behaviors last night. Pt says she will try to not get upset before understanding what is happening. Pt reacted last night and could not process Hydrologist would try to see what was going on. Pt only could focus on she was not going to get minipress and could not hear anything else staff were saying to her. Pt did inform writer " I'm not going to fight you tonight"  A: Pt was offered support and encouragement. Pt was given scheduled medications. Pt was encourage to attend groups. Q 15 minute checks were done for safety.   R:Pt attends groups and interacts well with peers and staff. Pt is taking medication. Pt has no complaints at this time .Pt receptive to treatment and safety maintained on unit.

## 2014-03-19 NOTE — Discharge Summary (Deleted)
  The note originally documented on this encounter has been moved the the encounter in which it belongs.  

## 2014-03-19 NOTE — Progress Notes (Signed)
D: Pt denies SI/HI/AVH.   A: Pt was offered support and encouragement. Pt was given scheduled medications. Pt was encourage to attend groups. Q 15 minute checks were done for safety.   R: Pt is taking medication.Pt receptive to treatment and safety maintained on unit.

## 2014-03-19 NOTE — Progress Notes (Signed)
[facility] MD Progress Note  03/19/2014 4:29 PM [name]  MRN:  [id number] Subjective:  Aware that she she went off last night requiring restraints. She states she "panicked" when she did not have the minipress ordered to help with her nightmares, she states she is afraid of the nightmares. She states she is going to work hard tonight in not going off using other strategies like talking to staff, reasoning with them what is causing the frustration so they can work together with her to have a solution. She feels "OK" with the Risperdal and does not mind taking it. Wants to be sure she has the Minipress tonight. She also would like some Ensure as she is not eating well as she is not able to go to the cafeteria due to being 1:1 and what is brought to her is not necessarily of her her liking. She also hopes that if she does well tonight she can progress to CO Principal Problem: Schizoaffective disorder, bipolar type Diagnosis:   Patient Active Problem List   Diagnosis Date Noted  . Prolonged QT syndrome [I45.81]   . Schizoaffective disorder, unspecified type [F25.9]   . EKG abnormality [R94.31] 03/12/2014  . Schizoaffective disorder, bipolar type [F25.0] 03/10/2014  . Borderline personality disorder [F60.3] 03/10/2014  . PTSD (post-traumatic stress disorder) [F43.10] 03/10/2014  . Asperger's syndrome [F84.5] 03/10/2014   Total Time spent with patient: 30 minutes   Past Medical History:  Past Medical History  Diagnosis Date  . Asthma   . Depression   . Schizoaffective disorder, bipolar type   . Autism   . Essential tremor   . Chronic constipation   . Acid reflux     Past Surgical History  Procedure Laterality Date  . Mouth surgery     Family History:  Family History  Problem Relation Age of Onset  . Mental illness Father   . PDD Brother   . Seizures Brother    Social History:  History  Alcohol Use No     History  Drug Use No    History   Social History  . Marital Status:  Single    Spouse Name: N/A  . Number of Children: N/A  . Years of Education: N/A   Social History Main Topics  . Smoking status: Never Smoker   . Smokeless tobacco: Not on file  . Alcohol Use: No  . Drug Use: No  . Sexual Activity: Yes   Other Topics Concern  . None   Social History Narrative   Additional History:    Sleep: Poor  Appetite:  Fair   Assessment:   Musculoskeletal: Strength & Muscle Tone: within normal limits Gait & Station: normal Patient leans: N/A   Psychiatric Specialty Exam: Physical Exam  Review of Systems  Constitutional: Negative.   HENT: Negative.   Eyes: Negative.   Respiratory: Negative.   Cardiovascular: Negative.   Gastrointestinal: Negative.   Genitourinary: Negative.   Musculoskeletal: Positive for joint pain.  Skin: Negative.   Neurological: Negative.   Endo/Heme/Allergies: Negative.   Psychiatric/Behavioral: Positive for depression. The patient is nervous/anxious and has insomnia.     Blood pressure 110/56, pulse 70, temperature 98 F (36.7 C), temperature source Oral, resp. rate 16, height 5\' 5"  (1.651 m), weight 83.9 kg (184 lb 15.5 oz), last menstrual period 03/17/2014, SpO2 100 %.Body mass index is 30.78 kg/(m^2).  General Appearance: Fairly Groomed  Engineer, water::  Fair  Speech:  Clear and Coherent  Volume:  Normal  Mood:  Anxious and worried  Affect:  anxious worried  Thought Process:  Coherent and Goal Directed  Orientation:  Full (Time, Place, and Person)  Thought Content:  fears worries concerns  Suicidal Thoughts:  No  Homicidal Thoughts:  No  Memory:  Immediate;   Fair Recent;   Fair Remote;   Fair  Judgement:  Fair  Insight:  Shallow  Psychomotor Activity:  Restlessness  Concentration:  Fair  Recall:  [employer] of Knowledge:Poor  Language: Fair  Akathisia:  No  Handed:  Right  AIMS (if indicated):     Assets:  Desire for Improvement  ADL's:  Intact  Cognition: WNL  Sleep:  Number of Hours: 5.5      Current Medications: Current Facility-Administered Medications  Medication Dose Route Frequency Provider Last Rate Last Dose  . acetaminophen (TYLENOL) tablet 650 mg  650 mg Oral Q6H PRN Laverle Hobby, PA-C      . alum & mag hydroxide-simeth (MAALOX/MYLANTA) 200-200-20 MG/5ML suspension 30 mL  30 mL Oral Q4H PRN Laverle Hobby, PA-C      . benztropine (COGENTIN) tablet 1 mg  1 mg Oral BID PRN Nicholaus Bloom, MD      . carbamazepine (TEGRETOL XR) 12 hr tablet 200 mg  200 mg Oral BID Nicholaus Bloom, MD   200 mg at 03/19/14 0845  . clonazePAM (KLONOPIN) tablet 0.25 mg  0.25 mg Oral BID PRN Laverle Hobby, PA-C   0.25 mg at 03/19/14 0850  . clonazePAM (KLONOPIN) tablet 1 mg  1 mg Oral QHS Laverle Hobby, PA-C   1 mg at 03/18/14 2139  . docusate sodium (COLACE) capsule 100 mg  100 mg Oral BID Laverle Hobby, PA-C   100 mg at 03/19/14 0844  . escitalopram (LEXAPRO) tablet 20 mg  20 mg Oral Daily Nicholaus Bloom, MD   20 mg at 03/19/14 0844  . feeding supplement (ENSURE COMPLETE) (ENSURE COMPLETE) liquid 237 mL  237 mL Oral TID BM Nicholaus Bloom, MD   237 mL at 03/19/14 1426  . ferrous sulfate tablet 325 mg  325 mg Oral Q supper Laverle Hobby, PA-C   325 mg at 03/18/14 1657  . hydrOXYzine (ATARAX/VISTARIL) tablet 25 mg  25 mg Oral Q4H PRN Laverle Hobby, PA-C      . ibuprofen (ADVIL,MOTRIN) tablet 800 mg  800 mg Oral Q8H PRN Laverle Hobby, PA-C   800 mg at 03/19/14 1202  . lamoTRIgine (LAMICTAL) tablet 50 mg  50 mg Oral Daily Laverle Hobby, PA-C   50 mg at 03/19/14 0844  . magnesium hydroxide (MILK OF MAGNESIA) suspension 30 mL  30 mL Oral Daily PRN Laverle Hobby, PA-C      . metoprolol tartrate (LOPRESSOR) tablet 25 mg  25 mg Oral BID Laverle Hobby, PA-C   25 mg at 03/19/14 0844  . pantoprazole (PROTONIX) EC tablet 40 mg  40 mg Oral Daily Laverle Hobby, PA-C   40 mg at 03/19/14 0844  . prazosin (MINIPRESS) capsule 1 mg  1 mg Oral QHS Nicholaus Bloom, MD      . risperiDONE (RISPERDAL  M-TABS) disintegrating tablet 1 mg  1 mg Oral QHS Nicholaus Bloom, MD   1 mg at 03/18/14 2139  . risperiDONE (RISPERDAL) tablet 0.5 mg  0.5 mg Oral BID Nicholaus Bloom, MD   0.5 mg at 03/19/14 0844  . traZODone (DESYREL) tablet 50 mg  50 mg Oral QHS  PRN Laverle Hobby, PA-C        Lab Results: No results found for this or any previous visit (from the past 48 hour(s)).  Physical Findings: AIMS: Facial and Oral Movements Muscles of Facial Expression: None, normal Lips and Perioral Area: None, normal Jaw: None, normal Tongue: None, normal,Extremity Movements Upper (arms, wrists, hands, fingers): None, normal Lower (legs, knees, ankles, toes): None, normal, Trunk Movements Neck, shoulders, hips: None, normal, Overall Severity Severity of abnormal movements (highest score from questions above): None, normal Incapacitation due to abnormal movements: None, normal Patient's awareness of abnormal movements (rate only patient's report): No Awareness, Dental Status Current problems with teeth and/or dentures?: No Does patient usually wear dentures?: No  CIWA:  CIWA-Ar Total: 4 COWS:  COWS Total Score: 3  Treatment Plan Summary: Daily contact with patient to assess and evaluate symptoms and progress in treatment and Medication management Supportive approach/work on coping skills Lamonte Sakai been able to tolerate the Risperdal so far EKG WNL Will keep the Minipress at night Will continue the Risperdal at night and increase to 1.5 mg m tab Will continue the Risperdal 0.5 mg BID during the day Will continue 1:1 and consider CO if she has a good night  Medical Decision Making:  Review of Psycho-Social Stressors (1), Established Problem, Worsening (2), Review of Medication Regimen & Side Effects (2) and Review of New Medication or Change in Dosage (2)     Tywon Niday A 03/19/2014, 4:29 PM

## 2014-03-19 NOTE — BHH Group Notes (Signed)
[place] LCSW Group Therapy  03/19/2014 1:56 PM  Type of Therapy:  Group Therapy  Participation Level:  Minimal  Participation Quality:  Appropriate  Affect:  Flat  Cognitive:  Alert  Insight:  Limited  Engagement in Therapy:  Limited  Modes of Intervention:  Education, Socialization and Support  Summary of Progress/Problems:Today's group focused on topics such as hope and care. [name] finds hope in the future and boyfriend. She did not stay in group long.   Hyatt,Candace 03/19/2014, 1:56 PM

## 2014-03-19 NOTE — Progress Notes (Signed)
[facility] Group Notes:  (Nursing/MHT/Case Management/Adjunct)  Date:  03/19/2014  Time:  3:36 AM  Type of Therapy:  Psychoeducational Skills  Participation Level:  Active  Participation Quality:  Monopolizing  Affect:  Appropriate  Cognitive:  Appropriate  Insight:  Good  Engagement in Group:  Developing/Improving and Monopolizing  Modes of Intervention:  Education  Summary of Progress/Problems: The patient shared in group last evening that she had a good day overall since she did not have any outbursts. She also stated that she had a good talk with her doctor. The patient was also proud of the fact that she had a good talk with her parents over the telephone in which she apologized to them. Patient was redirected for some minor talking back and forth during the group. As a theme for the day, her lifestyle changes will include working on reducing the number and frequency of her outbursts.   Archie Balboa S 03/19/2014, 3:36 AM
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
## 2014-03-19 NOTE — Plan of Care (Signed)
Problem: Aggression Towards others,Towards Self, and or Destruction Goal: LTG - No aggression,physical/verbal/destruction prior to D/C (Patient will have no episodes of physical or verbal aggression or property destruction towards self or others for _____ day (s) prior to discharge.)  Outcome: Progressing Pt showed no aggression toward staff this evening Goal: STG-Patient will comply with prescribed medication regimen (Patient will comply with prescribed medication regimen)  Outcome: Progressing Pt taking medications Goal: STG-Patient will identify a plan to deal with triggers Outcome: Progressing Pt stated she will try to talk to staff if she starts getting upset
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
## 2014-03-20 ENCOUNTER — Other Ambulatory Visit: Payer: Self-pay

## 2014-03-20 DIAGNOSIS — F250 Schizoaffective disorder, bipolar type: Principal | ICD-10-CM

## 2014-03-20 MED ORDER — CLOTRIMAZOLE 1 % VA CREA
1.0000 | TOPICAL_CREAM | Freq: Every day | VAGINAL | Status: DC
  Administered 2014-03-20 – 2014-03-21 (×2): 1 via VAGINAL
  Filled 2014-03-20: qty 45

## 2014-03-20 MED ORDER — IBUPROFEN 800 MG PO TABS
ORAL_TABLET | ORAL | Status: AC
  Administered 2014-03-20: 09:00:00
  Filled 2014-03-20: qty 1

## 2014-03-20 NOTE — Progress Notes (Signed)
Patient ID: [name], female   DOB: 1990/09/07, 24 y.o.   MRN: [id number]  1:1 Note:  D: Patient currently asleep. Respirations regular and unlabored, skin warm and dry. A: Sitter at bedside for continuous observation. R: No needs or complaints at this time. No s/s of distress noted.

## 2014-03-20 NOTE — [facility] Group Notes (Signed)
[facility] Notes: Coping skills and goals  Date:  03/20/2014  Time:  10:00am  Type of Therapy:  Nurse Education  Participation Level:  Did Not Attend  Participation Quality:  Inattentive  Affect:  Flat  Cognitive:  Lacking  Insight:  Lacking  Engagement in Group:  None  Modes of Intervention:  Education  Summary of Progress/Problems:  [name] 03/20/2014, 10:38 AM
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
## 2014-03-20 NOTE — Progress Notes (Addendum)
Pt has remained in her room this am. She spoke with the MD and stated voices said to kick and spit at people." pt stated,"when I hear them I start to sing a song." Pt has multilpe somatic complaints -Pt remains on a 1:1. MD made aware that pt continues to c/o right shoulder pain . Pt continues to put ice on her shoulder. Pt c/o a clear vaginal odorless discharge associated with vaginal itching. Pt is requesting for a female practictioner to see her. MD made aware. Pt s/p EKG done at the bedside by tech,Mac.3:40pm- pt states her depression is a 4/10 and her anxiety is a 7/10. Pt wants to continue to talk to staff and sing when she gets stressed. 4pm-Pt given 800mg  of motrin po for right shoulder pain a 7/10. Pt continues to apply ice to the area. 6pm-Pt was given her lotrimin cream early secondary to c/o sever vaginal itching. Pt did lie down afterwards.She remains on a 1:1.

## 2014-03-20 NOTE — Progress Notes (Addendum)
Patient ID: [name], female   DOB: Apr 07, 1990, 24 y.o.   MRN: [id number] [facility] MD Progress Note  03/20/2014 2:35 PM [name]  MRN:  [id number] Subjective:  Patient states " I am OK". At this time reports as only current issue a subjective sense of genital pruritus and states " I need a female Doctor to see me". She denies medication side effects. She acknowledges she has felt calmer. Objective: Patient case discussed with staff and patient seen with RN. At this time patient remains on 1:1 due to recent episodes of severe agitation and self injurious behaviors .  Currently calm, superficially pleasant.  Denies medication side effects. Does not endorse medication side effects. Due to QTc prolongation history she is getting serial EKGs and her antipsychotic management has been only slowly titrated. * Most recent EKG ( 2/12) is reported as NSR, no QTc prolongation ( QTc is 456)   Principal Problem: Schizoaffective disorder, bipolar type Diagnosis:   Patient Active Problem List   Diagnosis Date Noted  . Prolonged QT syndrome [I45.81]   . Schizoaffective disorder, unspecified type [F25.9]   . EKG abnormality [R94.31] 03/12/2014  . Schizoaffective disorder, bipolar type [F25.0] 03/10/2014  . Borderline personality disorder [F60.3] 03/10/2014  . PTSD (post-traumatic stress disorder) [F43.10] 03/10/2014  . Asperger's syndrome [F84.5] 03/10/2014   Total Time spent with patient: 20 minutes   Past Medical History:  Past Medical History  Diagnosis Date  . Asthma   . Depression   . Schizoaffective disorder, bipolar type   . Autism   . Essential tremor   . Chronic constipation   . Acid reflux     Past Surgical History  Procedure Laterality Date  . Mouth surgery     Family History:  Family History  Problem Relation Age of Onset  . Mental illness Father   . PDD Brother   . Seizures Brother    Social History:  History  Alcohol Use No     History  Drug Use No    History    Social History  . Marital Status: Single    Spouse Name: N/A  . Number of Children: N/A  . Years of Education: N/A   Social History Main Topics  . Smoking status: Never Smoker   . Smokeless tobacco: Not on file  . Alcohol Use: No  . Drug Use: No  . Sexual Activity: Yes   Other Topics Concern  . None   Social History Narrative   Additional History:    Sleep: improved   Appetite:  Fair   Assessment:   Musculoskeletal: Strength & Muscle Tone: within normal limits Gait & Station: normal Patient leans: N/A   Psychiatric Specialty Exam: Physical Exam  ROS  Blood pressure 113/70, pulse 73, temperature 97.9 F (36.6 C), temperature source Oral, resp. rate 18, height 5\' 5"  (1.651 m), weight 184 lb 15.5 oz (83.9 kg), last menstrual period 03/17/2014, SpO2 99 %.Body mass index is 30.78 kg/(m^2).  General Appearance: Fairly Groomed  Engineer, water::  improved   Speech:  Clear and Coherent  Volume:  Normal  Mood:  states she feels OK today  Affect:  Constricted and smiles briefly at times  Thought Process:  Coherent and Goal Directed  Orientation:  Full (Time, Place, and Person)  Thought Content:  fears worries concerns  Suicidal Thoughts:  No- currently denies thoughts of hurting self   Homicidal Thoughts:  No  Memory:  Immediate;   Fair Recent;   Fair Remote;  Fair  Judgement:  Fair  Insight:  Shallow  Psychomotor Activity:  calm at this time  Concentration:  Fair  Recall:  [employer] of Knowledge:Good  Language: Fair  Akathisia:  No  Handed:  Right  AIMS (if indicated):     Assets:  Desire for Improvement  ADL's:  Intact  Cognition: WNL  Sleep:  Number of Hours: 5     Current Medications: Current Facility-Administered Medications  Medication Dose Route Frequency Provider Last Rate Last Dose  . acetaminophen (TYLENOL) tablet 650 mg  650 mg Oral Q6H PRN Laverle Hobby, PA-C      . alum & mag hydroxide-simeth (MAALOX/MYLANTA) 200-200-20 MG/5ML suspension  30 mL  30 mL Oral Q4H PRN Laverle Hobby, PA-C      . benztropine (COGENTIN) tablet 1 mg  1 mg Oral BID PRN Nicholaus Bloom, MD      . carbamazepine (TEGRETOL XR) 12 hr tablet 200 mg  200 mg Oral BID Nicholaus Bloom, MD   200 mg at 03/20/14 0815  . clonazePAM (KLONOPIN) tablet 0.25 mg  0.25 mg Oral BID PRN Laverle Hobby, PA-C   0.25 mg at 03/20/14 0315  . clonazePAM (KLONOPIN) tablet 1 mg  1 mg Oral QHS Laverle Hobby, PA-C   1 mg at 03/19/14 2135  . docusate sodium (COLACE) capsule 100 mg  100 mg Oral BID Laverle Hobby, PA-C   100 mg at 03/20/14 1308  . escitalopram (LEXAPRO) tablet 20 mg  20 mg Oral Daily Nicholaus Bloom, MD   20 mg at 03/20/14 [phone]  . feeding supplement (ENSURE COMPLETE) (ENSURE COMPLETE) liquid 237 mL  237 mL Oral TID BM Nicholaus Bloom, MD   237 mL at 03/20/14 0800  . ferrous sulfate tablet 325 mg  325 mg Oral Q supper Laverle Hobby, PA-C   325 mg at 03/19/14 1738  . hydrOXYzine (ATARAX/VISTARIL) tablet 25 mg  25 mg Oral Q4H PRN Laverle Hobby, PA-C   25 mg at 03/20/14 0315  . ibuprofen (ADVIL,MOTRIN) tablet 800 mg  800 mg Oral Q8H PRN Laverle Hobby, PA-C   800 mg at 03/19/14 2135  . lamoTRIgine (LAMICTAL) tablet 50 mg  50 mg Oral Daily Laverle Hobby, PA-C   50 mg at 03/20/14 4696  . magnesium hydroxide (MILK OF MAGNESIA) suspension 30 mL  30 mL Oral Daily PRN Laverle Hobby, PA-C      . metoprolol tartrate (LOPRESSOR) tablet 25 mg  25 mg Oral BID Laverle Hobby, PA-C   25 mg at 03/20/14 2952  . pantoprazole (PROTONIX) EC tablet 40 mg  40 mg Oral Daily Laverle Hobby, PA-C   40 mg at 03/20/14 8413  . prazosin (MINIPRESS) capsule 1 mg  1 mg Oral QHS Nicholaus Bloom, MD   1 mg at 03/19/14 2135  . risperiDONE (RISPERDAL M-TABS) disintegrating tablet 1 mg  1 mg Oral QHS Nicholaus Bloom, MD   1 mg at 03/19/14 2135  . risperiDONE (RISPERDAL) tablet 0.5 mg  0.5 mg Oral BID Nicholaus Bloom, MD   0.5 mg at 03/20/14 2440  . traZODone (DESYREL) tablet 50 mg  50 mg Oral QHS PRN Laverle Hobby, PA-C        Lab Results: No results found for this or any previous visit (from the past 48 hour(s)).  Physical Findings: AIMS: Facial and Oral Movements Muscles of Facial Expression: None, normal Lips and Perioral Area:  None, normal Jaw: None, normal Tongue: None, normal,Extremity Movements Upper (arms, wrists, hands, fingers): None, normal Lower (legs, knees, ankles, toes): None, normal, Trunk Movements Neck, shoulders, hips: None, normal, Overall Severity Severity of abnormal movements (highest score from questions above): None, normal Incapacitation due to abnormal movements: None, normal Patient's awareness of abnormal movements (rate only patient's report): No Awareness, Dental Status Current problems with teeth and/or dentures?: No Does patient usually wear dentures?: No  CIWA:  CIWA-Ar Total: 4 COWS:  COWS Total Score: 3   Assessment- patient is partially improved, and today is calm, superficially pleasant, and not endorsing any self injurious ideations. However, as discussed with staff, has had recent and relatively sudden episodes of severe agitation , and had episode of repeated head banging earlier in the week. Due to this , will continue 1:1 observation, which may be helping patient be less impulsive and self injurious.   Treatment Plan Summary: Daily contact with patient to assess and evaluate symptoms and progress in treatment and Medication management Risperidone 0.5 mgrs BID + 1 mgr QHS Minipress 1 mgr QHS Lamictal 50 mgrs QDAY Lexapro 20 mgrs QDAY Tegretol 200 mgrs BID Klonopin 1 mgr QHS Will ask female Nurse Practitioner to meet with patient to review her gynecological symptoms, initiate treatment versus request Ob/Gyn consult as appropriate.  Medical Decision Making:  Established Problem, Stable/Improving (1), Review of Psycho-Social Stressors (1), Review of Medication Regimen & Side Effects (2) and Review of New Medication or Change in Dosage  (2)     COBOS, FERNANDO 03/20/2014, 2:35 PM

## 2014-03-20 NOTE — Progress Notes (Signed)
Pt complained that she was hearing voices to hit and spit on people. Pt stated she normally turns her music up loud to drown it out. She was informed that option is not a good one in this facility due to increased noise could wake up other patients. Pt was given 0.25mg   PRN klonopin and 25 mg vistaril.

## 2014-03-20 NOTE — Progress Notes (Signed)
Patient ID: [name], female   DOB: Jul 04, 1990, 24 y.o.   MRN: [id number]   1:1 Note:  D: Patient resting in bed, talking to sitter. Pt pleasant and cooperative on assessment and has bright affect. Pt laughing and joking appropriately with staff. Pt denies SI/HI or plans to harm herself at this time. A: 1:1 continuous observation, encourage staff interaction, administer medications as ordered by MD. R: No s/s of distress noted at this time, respirations regular and unlabored, skin warm and dry.

## 2014-03-20 NOTE — Progress Notes (Signed)
Psychoeducational Group Note  Date:  03/20/2014 Time:  2228  Group Topic/Focus:  Wrap-Up Group:   The focus of this group is to help patients review their daily goal of treatment and discuss progress on daily workbooks.  Participation Level: Did Not Attend  Participation Quality:  Not Applicable  Affect:  Not Applicable  Cognitive:  Not Applicable  Insight:  Not Applicable  Engagement in Group: Not Applicable  Additional Comments:  The patient did not attend group this evening since she was talking to the staff assigned to her.   Archie Balboa S 03/20/2014, 10:28 PM

## 2014-03-21 ENCOUNTER — Other Ambulatory Visit: Payer: Self-pay

## 2014-03-21 DIAGNOSIS — I4581 Long QT syndrome: Secondary | ICD-10-CM

## 2014-03-21 MED ORDER — RISPERIDONE 0.5 MG PO TABS
0.5000 mg | ORAL_TABLET | Freq: Every morning | ORAL | Status: DC
  Administered 2014-03-22 – 2014-03-28 (×7): 0.5 mg via ORAL
  Filled 2014-03-21 (×11): qty 1

## 2014-03-21 MED ORDER — DOCUSATE SODIUM 100 MG PO CAPS
100.0000 mg | ORAL_CAPSULE | Freq: Every day | ORAL | Status: DC
  Administered 2014-03-22 – 2014-03-27 (×6): 100 mg via ORAL
  Filled 2014-03-21 (×10): qty 1

## 2014-03-21 MED ORDER — RISPERIDONE 2 MG PO TBDP
2.0000 mg | ORAL_TABLET | Freq: Every day | ORAL | Status: DC
  Administered 2014-03-21 – 2014-03-25 (×5): 2 mg via ORAL
  Filled 2014-03-21: qty 1
  Filled 2014-03-21: qty 2
  Filled 2014-03-21 (×8): qty 1

## 2014-03-21 NOTE — Progress Notes (Signed)
Patient ID: [name], female   DOB: 1990-10-01, 24 y.o.   MRN: [id number] [facility] MD Progress Note  03/21/2014 2:50 PM [name]  MRN:  [id number] Subjective:  Patient reports she has had an increase in hallucinations today. States voices tell her to "  Hurt  And spit on people", but states she has no intention to do so. She also requests to change her colace medication to night time , as she states this is the time she normally takes it. Also reports she continues to have right shoulder discomfort, particularly when making certain movements such as combing hair.  Objective:  I have discussed case with RN . Patient generally calm, spends most time in her room. She remains on 1:1 observation.  No recent agitation, but as noted, reports ongoing hallucinations . Presents calm, cooperative, reports ongoing depression. Denies SI. Repeat EKG reported as NSR, pulse 71, no QTc prolongation . QTc 441.   Principal Problem: Schizoaffective disorder, bipolar type Diagnosis:   Patient Active Problem List   Diagnosis Date Noted  . Prolonged QT syndrome [I45.81]   . Schizoaffective disorder, unspecified type [F25.9]   . EKG abnormality [R94.31] 03/12/2014  . Schizoaffective disorder, bipolar type [F25.0] 03/10/2014  . Borderline personality disorder [F60.3] 03/10/2014  . PTSD (post-traumatic stress disorder) [F43.10] 03/10/2014  . Asperger's syndrome [F84.5] 03/10/2014   Total Time spent with patient: 20 minutes   Past Medical History:  Past Medical History  Diagnosis Date  . Asthma   . Depression   . Schizoaffective disorder, bipolar type   . Autism   . Essential tremor   . Chronic constipation   . Acid reflux     Past Surgical History  Procedure Laterality Date  . Mouth surgery     Family History:  Family History  Problem Relation Age of Onset  . Mental illness Father   . PDD Brother   . Seizures Brother    Social History:  History  Alcohol Use No     History  Drug  Use No    History   Social History  . Marital Status: Single    Spouse Name: N/A  . Number of Children: N/A  . Years of Education: N/A   Social History Main Topics  . Smoking status: Never Smoker   . Smokeless tobacco: Not on file  . Alcohol Use: No  . Drug Use: No  . Sexual Activity: Yes   Other Topics Concern  . None   Social History Narrative   Additional History:    Sleep: improved   Appetite:  Fair   Assessment:   Musculoskeletal: Strength & Muscle Tone: within normal limits Gait & Station: normal Patient leans: N/A   Psychiatric Specialty Exam: Physical Exam  Review of Systems  Constitutional: Negative for fever and chills.  Respiratory: Negative for cough and shortness of breath.   Cardiovascular: Negative for chest pain.  Gastrointestinal: Negative for vomiting.  Musculoskeletal: Negative.        Right shoulder pain   Skin: Negative for rash.  Psychiatric/Behavioral: Positive for depression and hallucinations.    Blood pressure 113/70, pulse 73, temperature 97.9 F (36.6 C), temperature source Oral, resp. rate 18, height 5\' 5"  (1.651 m), weight 184 lb 15.5 oz (83.9 kg), last menstrual period 03/17/2014, SpO2 99 %.Body mass index is 30.78 kg/(m^2).  General Appearance: Fairly Groomed  Patent attorney::  improved   Speech:  Clear and Coherent  Volume:  Normal  Mood:   Depressed ,  but improved compared to admission  Affect:  Constricted and smiles briefly at times  Thought Process:  Coherent and Goal Directed  Orientation:  Full (Time, Place, and Person)  Thought Content:  fears worries concerns  Suicidal Thoughts:  No-  Denies SI at this time  Homicidal Thoughts:  No States she hears voices telling her to hurt people and to spit on them, but denies any plan or intention of this and currently denies thoughts of hurting self   Memory:  Immediate;   Fair Recent;   Fair Remote;   Fair  Judgement:  Fair  Insight:  Shallow  Psychomotor Activity:  calm  at this time  Concentration:  Fair  Recall:  [employer] of Knowledge:Good  Language: Fair  Akathisia:  No  Handed:  Right  AIMS (if indicated):     Assets:  Desire for Improvement  ADL's:  Intact  Cognition: WNL  Sleep:  Number of Hours: 6     Current Medications: Current Facility-Administered Medications  Medication Dose Route Frequency Provider Last Rate Last Dose  . acetaminophen (TYLENOL) tablet 650 mg  650 mg Oral Q6H PRN Kerry Hough, PA-C      . alum & mag hydroxide-simeth (MAALOX/MYLANTA) 200-200-20 MG/5ML suspension 30 mL  30 mL Oral Q4H PRN Kerry Hough, PA-C      . benztropine (COGENTIN) tablet 1 mg  1 mg Oral BID PRN Rachael Fee, MD      . carbamazepine (TEGRETOL XR) 12 hr tablet 200 mg  200 mg Oral BID Rachael Fee, MD   200 mg at 03/21/14 0913  . clonazePAM (KLONOPIN) tablet 0.25 mg  0.25 mg Oral BID PRN Kerry Hough, PA-C   0.25 mg at 03/21/14 1223  . clonazePAM (KLONOPIN) tablet 1 mg  1 mg Oral QHS Kerry Hough, PA-C   1 mg at 03/20/14 2034  . clotrimazole (GYNE-LOTRIMIN) vaginal cream 1 Applicatorful  1 Applicatorful Vaginal QHS Lindwood Qua, NP   1 Applicatorful at 03/20/14 1800  . docusate sodium (COLACE) capsule 100 mg  100 mg Oral BID Kerry Hough, PA-C   100 mg at 03/21/14 0913  . escitalopram (LEXAPRO) tablet 20 mg  20 mg Oral Daily Rachael Fee, MD   20 mg at 03/21/14 0913  . feeding supplement (ENSURE COMPLETE) (ENSURE COMPLETE) liquid 237 mL  237 mL Oral TID BM Rachael Fee, MD   237 mL at 03/21/14 0930  . ferrous sulfate tablet 325 mg  325 mg Oral Q supper Kerry Hough, PA-C   325 mg at 03/20/14 1706  . hydrOXYzine (ATARAX/VISTARIL) tablet 25 mg  25 mg Oral Q4H PRN Kerry Hough, PA-C   25 mg at 03/21/14 0148  . ibuprofen (ADVIL,MOTRIN) tablet 800 mg  800 mg Oral Q8H PRN Kerry Hough, PA-C   800 mg at 03/21/14 1130  . lamoTRIgine (LAMICTAL) tablet 50 mg  50 mg Oral Daily Kerry Hough, PA-C   50 mg at 03/21/14 0912  .  magnesium hydroxide (MILK OF MAGNESIA) suspension 30 mL  30 mL Oral Daily PRN Kerry Hough, PA-C      . metoprolol tartrate (LOPRESSOR) tablet 25 mg  25 mg Oral BID Kerry Hough, PA-C   25 mg at 03/21/14 0912  . pantoprazole (PROTONIX) EC tablet 40 mg  40 mg Oral Daily Kerry Hough, PA-C   40 mg at 03/21/14 0912  . prazosin (MINIPRESS) capsule 1 mg  1 mg Oral QHS Rachael Fee, MD   1 mg at 03/20/14 2033  . risperiDONE (RISPERDAL M-TABS) disintegrating tablet 1 mg  1 mg Oral QHS Rachael Fee, MD   1 mg at 03/20/14 2036  . risperiDONE (RISPERDAL) tablet 0.5 mg  0.5 mg Oral BID Rachael Fee, MD   0.5 mg at 03/21/14 0912  . traZODone (DESYREL) tablet 50 mg  50 mg Oral QHS PRN Kerry Hough, PA-C   50 mg at 03/20/14 2034    Lab Results: No results found for this or any previous visit (from the past 48 hour(s)).  Physical Findings: AIMS: Facial and Oral Movements Muscles of Facial Expression: None, normal Lips and Perioral Area: None, normal Jaw: None, normal Tongue: None, normal,Extremity Movements Upper (arms, wrists, hands, fingers): None, normal Lower (legs, knees, ankles, toes): None, normal, Trunk Movements Neck, shoulders, hips: None, normal, Overall Severity Severity of abnormal movements (highest score from questions above): None, normal Incapacitation due to abnormal movements: None, normal Patient's awareness of abnormal movements (rate only patient's report): No Awareness, Dental Status Current problems with teeth and/or dentures?: No Does patient usually wear dentures?: No  CIWA:  CIWA-Ar Total: 4 COWS:  COWS Total Score: 3   Assessment- patient has not presented with any ongoing agitation or head banging behaviors. Presents calm, although isolative and reporting ongoing hallucinations. No QTc prolongation on today's EKG. This may have been temporally related, caused by Clozaril, which she is no longer taking.   Treatment Plan Summary: Daily contact with patient  to assess and evaluate symptoms and progress in treatment and Medication management Increase Risperidone to  0.5 mgrs QAM  + 2mg r QHS ( QTc within normal and patient continues to report hallucinations)  Minipress 1 mgr QHS Lamictal 50 mgrs QDAY Lexapro 20 mgrs QDAY Tegretol 200 mgrs BID Klonopin 1 mgr QHS Treatment team will re-evaluate in AM and decide upon continuing or D/Cing one to one observation.  Medical Decision Making:  Established Problem, Stable/Improving (1), Review of Psycho-Social Stressors (1), Review of Medication Regimen & Side Effects (2) and Review of New Medication or Change in Dosage (2)     Shalita Notte 03/21/2014, 2:50 PM

## 2014-03-21 NOTE — Clinical Social Work Note (Signed)
Clinical Social Work Note  At patient and RN request, attempted to make telephone contact with pt's fiance Melton Alar [phone].  He apparently lives in a group home, and the staff said that he had left, was not there at that time.  No message left.  Selmer Dominion, LCSW 03/21/2014, 4:02 PM

## 2014-03-21 NOTE — Progress Notes (Signed)
Patient observed resting in bed on R side. Laughing and conversing with staff. Patient did have verbal altercation with peer within the last hour. Has been up using the phone. Remains labile, unpredictable. 1:1 obs in place for patient's safety. Patient is safe. [name]

## 2014-03-21 NOTE — [facility] Group Notes (Signed)
[facility] Group Notes:  (Clinical Social Work)  03/21/2014   11:15am-12:00pm  Summary of Progress/Problems:  The main focus of today's process group was to listen to a variety of genres of music and to identify that different types of music provoke different responses.  The patient then was able to identify personally what was soothing for them, as well as energizing.  Handouts were used to record feelings evoked, as well as how patient can personally use this knowledge in sleep habits, with depression, and with other symptoms.  The patient expressed understanding of concepts, as well as knowledge of how each type of music affected him/her and how this can be used at home as a wellness/recovery tool.  She was extremely labile and reacted visibly, whether positively or negatively, to every song.  She was so irritable during several songs that CSW switched song.    Type of Therapy:  Music Therapy   Participation Level:  Active  Participation Quality:  Attentive and Sharing  Affect:  Flat and Labile  Cognitive:  Confused  Insight:  Developing  Engagement in Therapy:  Developing  Modes of Intervention:   Activity, Exploration  Selmer Dominion, LCSW 03/21/2014, 12:30pm
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
## 2014-03-21 NOTE — Progress Notes (Signed)
  1:1 Note-  D: Patient pleasant and cooperative with care, brightens on approach, silly acting at times. Pt appropriate with staff/peers on unit; no self-harm behaviors noted. Pt states she needs something for anxiety, but displays no agitation and is laughing. A: Continuous 1:1 monitoring with safety sitter, encourage peer interaction and group participation, administer medications as ordered by MD. R: Pt compliant with medications; remains calm and pleasant with staff.

## 2014-03-21 NOTE — Progress Notes (Signed)
Patient continues to rest in bed. Up at intervals. Attended music group but said the songs made her miss her fiance and she became tearful. Patient also reported command AH to harm others by punching, hitting, spitting. Patient reluctant to take klonpin 0.25mg  but did comply. Advil for shoulder pain given as well which went from a 10/10 down to a 6/10. NP and MD notified regarding patient's AH and request for prn. EKG obtained. No new orders received. She remains on 1:1 obs for her safety and patient is safe. She verbally contracts for safety. Jamie Kato

## 2014-03-21 NOTE — Progress Notes (Signed)
  1:1 Note:  D: Patient currently asleep; no s/s of distress noted at this time. A: 1:1 continuous observation.  R: No needs noted. Respirations regular and unlabored.

## 2014-03-21 NOTE — Plan of Care (Signed)
Problem: Ineffective individual coping Goal: STG: Patient will remain free from self harm Outcome: Progressing Patient denies Si and has not engaged in any self harm.  Problem: Alteration in thought process Goal: STG-Patient is able to discuss thoughts with staff Outcome: Progressing Patient came to this writer to report command AH to harm others - punch, hit, spit. Asking for a prn.

## 2014-03-21 NOTE — [facility] Group Notes (Signed)
[facility] Group Notes:  (Nursing/MHT/Case Management/Adjunct)  Date:  03/21/2014  Time:  0930 am  Type of Therapy:  Psychoeducational Skills  Participation Level:  Did Not Attend  [name] 03/21/2014, 10:33 AM

## 2014-03-22 ENCOUNTER — Other Ambulatory Visit: Payer: Self-pay

## 2014-03-22 MED ORDER — METRONIDAZOLE 500 MG PO TABS
500.0000 mg | ORAL_TABLET | Freq: Two times a day (BID) | ORAL | Status: AC
  Administered 2014-03-22 – 2014-03-28 (×14): 500 mg via ORAL
  Filled 2014-03-22 (×17): qty 1

## 2014-03-22 NOTE — Progress Notes (Signed)
Patient ID: [name], female   DOB: 02-12-1990, 24 y.o.   MRN: [id number] .PER STATE REGULATIONS 482.30  THIS CHART WAS REVIEWED FOR MEDICAL NECESSITY WITH RESPECT TO THE PATIENT'S ADMISSION/ DURATION OF STAY.  NEXT REVIEW DATE: 03/25/2014  Chauncy Lean, RN, BSN CASE MANAGER
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
## 2014-03-22 NOTE — Progress Notes (Signed)
RN 1:1 NOTE  D: Patient denies SI/HI; patient had complaints about vaginal burning and itching and also shoulder pain   A: Monitored q 15 minutes; patient encouraged to attend groups; patient educated about medications; patient given medications per physician orders; patient encouraged to express feelings and/or concerns  R: Patient is calm and cooperative; patient is needy at times; patient's interaction with staff and peers is assertive and appropriate at this time; patient was able to set goal to talk with staff 1:1 when having feelings of SI; patient is taking medications as prescribed and tolerating medications

## 2014-03-22 NOTE — Progress Notes (Signed)
  1:1 Note-  D: Patient awake with complaints of right shoulder pain. Pt pleasant and bright on approach.  A: Continuous observation for safety. Give meds as ordered. R: No inappropriate behaviors noted.

## 2014-03-22 NOTE — Progress Notes (Signed)
Did not attended group 

## 2014-03-22 NOTE — Progress Notes (Signed)
RN 1:1 NOTE  D: Patient denies SI/HI and A/V hallucinations  A: Monitored q 15 minutes; patient encouraged to attend groups; patient educated about medications; patient given medications per physician orders; patient encouraged to express feelings and/or concerns; continue 1:1 observation  R: Patient is taking medications; patient is talkative and needed frequent needs; patient is cooperative

## 2014-03-22 NOTE — Progress Notes (Signed)
  1:1 Note-  D: Patient asleep with no s/s of distress noted. Respirations regular and unlabored A: pt on 1:1 observation for safety. R: no change in status.
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
## 2014-03-22 NOTE — Progress Notes (Signed)
Patient ID: [name], female   DOB: 12-30-1990, 24 y.o.   MRN: [id number] PER STATE REGULATIONS 482.30  THIS CHART WAS REVIEWED FOR MEDICAL NECESSITY WITH RESPECT TO THE PATIENT'S ADMISSION/ DURATION OF STAY.  NEXT REVIEW DATE: 03/21/2014  Chauncy Lean, RN, BSN CASE MANAGER

## 2014-03-23 ENCOUNTER — Inpatient Hospital Stay (HOSPITAL_COMMUNITY): Payer: Medicare Other

## 2014-03-23 ENCOUNTER — Encounter (HOSPITAL_COMMUNITY): Payer: Self-pay | Admitting: Emergency Medicine

## 2014-03-23 ENCOUNTER — Other Ambulatory Visit: Payer: Self-pay

## 2014-03-23 LAB — URINALYSIS, ROUTINE W REFLEX MICROSCOPIC
Bilirubin Urine: NEGATIVE
Glucose, UA: NEGATIVE mg/dL
Hgb urine dipstick: NEGATIVE
Ketones, ur: NEGATIVE mg/dL
Leukocytes, UA: NEGATIVE
NITRITE: NEGATIVE
PH: 7.5 (ref 5.0–8.0)
Protein, ur: NEGATIVE mg/dL
SPECIFIC GRAVITY, URINE: 1.011 (ref 1.005–1.030)
Urobilinogen, UA: 0.2 mg/dL (ref 0.0–1.0)

## 2014-03-23 LAB — CBC WITH DIFFERENTIAL/PLATELET
BASOS PCT: 0 % (ref 0–1)
Basophils Absolute: 0 10*3/uL (ref 0.0–0.1)
EOS ABS: 0 10*3/uL (ref 0.0–0.7)
EOS PCT: 0 % (ref 0–5)
HEMATOCRIT: 37.3 % (ref 36.0–46.0)
HEMOGLOBIN: 11.9 g/dL — AB (ref 12.0–15.0)
LYMPHS ABS: 2.1 10*3/uL (ref 0.7–4.0)
LYMPHS PCT: 27 % (ref 12–46)
MCH: 27 pg (ref 26.0–34.0)
MCHC: 31.9 g/dL (ref 30.0–36.0)
MCV: 84.8 fL (ref 78.0–100.0)
MONO ABS: 0.6 10*3/uL (ref 0.1–1.0)
MONOS PCT: 7 % (ref 3–12)
Neutro Abs: 5.2 10*3/uL (ref 1.7–7.7)
Neutrophils Relative %: 66 % (ref 43–77)
Platelets: 256 10*3/uL (ref 150–400)
RBC: 4.4 MIL/uL (ref 3.87–5.11)
RDW: 13.6 % (ref 11.5–15.5)
WBC: 8 10*3/uL (ref 4.0–10.5)

## 2014-03-23 LAB — COMPREHENSIVE METABOLIC PANEL
ALBUMIN: 4.4 g/dL (ref 3.5–5.2)
ALK PHOS: 73 U/L (ref 39–117)
ALT: 17 U/L (ref 0–35)
AST: 19 U/L (ref 0–37)
Anion gap: 7 (ref 5–15)
BILIRUBIN TOTAL: 0.5 mg/dL (ref 0.3–1.2)
BUN: 11 mg/dL (ref 6–23)
CHLORIDE: 105 mmol/L (ref 96–112)
CO2: 24 mmol/L (ref 19–32)
CREATININE: 0.69 mg/dL (ref 0.50–1.10)
Calcium: 8.7 mg/dL (ref 8.4–10.5)
GFR calc Af Amer: >90 mL/min (ref >90)
GFR calc non Af Amer: >90 mL/min (ref >90)
Glucose, Bld: 98 mg/dL (ref 70–99)
POTASSIUM: 4.1 mmol/L (ref 3.5–5.1)
SODIUM: 136 mmol/L (ref 135–145)
Total Protein: 7.1 g/dL (ref 6.0–8.3)

## 2014-03-23 LAB — PREGNANCY, URINE: Preg Test, Ur: NEGATIVE

## 2014-03-23 LAB — LIPASE, BLOOD: Lipase: 21 U/L (ref 11–59)

## 2014-03-23 MED ORDER — MORPHINE SULFATE 4 MG/ML IJ SOLN
4.0000 mg | Freq: Once | INTRAMUSCULAR | Status: DC
  Filled 2014-03-23: qty 1

## 2014-03-23 MED ORDER — HYDROCODONE-ACETAMINOPHEN 10-325 MG PO TABS
ORAL_TABLET | ORAL | Status: AC
  Filled 2014-03-23: qty 1

## 2014-03-23 MED ORDER — HYDROCODONE-ACETAMINOPHEN 10-325 MG PO TABS
1.0000 | ORAL_TABLET | Freq: Four times a day (QID) | ORAL | Status: DC | PRN
  Administered 2014-03-23 – 2014-03-28 (×19): 1 via ORAL
  Filled 2014-03-23 (×18): qty 1

## 2014-03-23 MED ORDER — IOHEXOL 300 MG/ML  SOLN
100.0000 mL | Freq: Once | INTRAMUSCULAR | Status: AC | PRN
  Administered 2014-03-23: 100 mL via INTRAVENOUS

## 2014-03-23 MED ORDER — SODIUM CHLORIDE 0.9 % IV BOLUS (SEPSIS)
1000.0000 mL | Freq: Once | INTRAVENOUS | Status: AC
  Administered 2014-03-23: 1000 mL via INTRAVENOUS

## 2014-03-23 MED ORDER — GI COCKTAIL ~~LOC~~
30.0000 mL | Freq: Once | ORAL | Status: AC
Start: 2014-03-23 — End: 2014-03-23
  Administered 2014-03-23: 30 mL via ORAL
  Filled 2014-03-23: qty 30

## 2014-03-23 MED ORDER — ONDANSETRON HCL 4 MG/2ML IJ SOLN
4.0000 mg | Freq: Once | INTRAMUSCULAR | Status: AC
  Administered 2014-03-23: 4 mg via INTRAVENOUS
  Filled 2014-03-23: qty 2

## 2014-03-23 MED ORDER — HYDROCODONE-ACETAMINOPHEN 10-325 MG PO TABS: 1.0000 | ORAL_TABLET | ORAL | Status: DC | PRN

## 2014-03-23 NOTE — ED Provider Notes (Signed)
CSN: [id number]     Arrival date & time 03/23/14  1943 History   First MD Initiated Contact with Patient 03/23/14 1949     Chief Complaint  Patient presents with  . Abdominal Pain  . Nausea     (Consider location/radiation/quality/duration/timing/severity/associated sxs/prior Treatment) HPI Comments: The patient is a 24 year old female currently at [facility] receiving treatment with a past medical history of schizoaffective disorder, depression, chronic constipation, acid reflux presenting from behavioral health with a chief complaint of left-sided abdominal discomfort since today. Patient reports discomfort onset approximately 1300 today. She reports associated nausea without emesis. She reports decrease in oral intake due to pain and nausea.  Reports she was abel to eat lunch today.  Reports mild resolution of symptoms with zofran, given by EMS. She reports normal BM today. Patient reports history of vaginal discharge, resolved. She also reports being treated for a UTI recently. Patient's last menstrual period was 03/17/2014.  Patient is a 24 y.o. female presenting with abdominal pain. The history is provided by the patient. No language interpreter was used.  Abdominal Pain Associated symptoms: nausea   Associated symptoms: no chills, no constipation, no diarrhea, no dysuria, no fever, no hematuria, no vaginal bleeding, no vaginal discharge and no vomiting     Past Medical History  Diagnosis Date  . Asthma   . Depression   . Schizoaffective disorder, bipolar type   . Autism   . Essential tremor   . Chronic constipation   . Acid reflux    Past Surgical History  Procedure Laterality Date  . Mouth surgery     Family History  Problem Relation Age of Onset  . Mental illness Father   . PDD Brother   . Seizures Brother    History  Substance Use Topics  . Smoking status: Never Smoker   . Smokeless tobacco: Not on file  . Alcohol Use: No   OB History    No data available      Review of Systems  Constitutional: Negative for fever and chills.  Gastrointestinal: Positive for nausea and abdominal pain. Negative for vomiting, diarrhea and constipation.  Genitourinary: Negative for dysuria, hematuria, vaginal bleeding and vaginal discharge.      Allergies  Coconut flavor; Geodon; Haloperidol and related; Lithium; Oxycodone; Prilosec; and Seroquel  Home Medications   Prior to Admission medications   Medication Sig Start Date End Date Taking? Authorizing Provider  carbamazepine (TEGRETOL XR) 100 MG 12 hr tablet Take 100 mg by mouth 2 (two) times daily.   Yes Historical Provider, MD  clonazePAM (KLONOPIN) 0.5 MG tablet Take 0.25 mg by mouth 2 (two) times daily as needed for anxiety.   Yes Historical Provider, MD  clonazePAM (KLONOPIN) 1 MG tablet Take 1 mg by mouth at bedtime.   Yes Historical Provider, MD  docusate sodium (COLACE) 100 MG capsule Take 100 mg by mouth 2 (two) times daily.   Yes Historical Provider, MD  esomeprazole (NEXIUM) 40 MG capsule Take 40 mg by mouth daily at 12 noon.   Yes Historical Provider, MD  ferrous sulfate 325 (65 FE) MG tablet Take 325 mg by mouth daily with supper.   Yes Historical Provider, MD  FLUoxetine (PROZAC) 40 MG capsule Take 1 capsule (40 mg total) by mouth every morning. 01/09/14  Yes Benjamine Mola, FNP  hydrOXYzine (ATARAX/VISTARIL) 25 MG tablet Take 25 mg by mouth every 4 (four) hours as needed for anxiety.   Yes Historical Provider, MD  ibuprofen (ADVIL,MOTRIN) 800 MG  tablet Take 800 mg by mouth every 8 (eight) hours as needed for moderate pain.   Yes Historical Provider, MD  LamoTRIgine 50 MG TBDP Take 1 tablet by mouth daily.   Yes Historical Provider, MD  metoprolol tartrate (LOPRESSOR) 25 MG tablet Take 1 tablet (25 mg total) by mouth 2 (two) times daily. 01/09/14  Yes Benjamine Mola, FNP  ranitidine (ZANTAC) 150 MG tablet Take 150 mg by mouth 2 (two) times daily.   Yes Historical Provider, MD  traZODone (DESYREL)  50 MG tablet Take 1 tablet (50 mg total) by mouth at bedtime as needed for sleep. Patient not taking: Reported on 03/16/2014 01/09/14   Benjamine Mola, FNP   BP 111/64 mmHg  Pulse 85  Temp(Src) 97.6 F (36.4 C) (Oral)  Resp 20  Ht 5\' 5"  (1.651 m)  Wt 184 lb 15.5 oz (83.9 kg)  BMI 30.78 kg/m2  SpO2 99%  LMP 03/17/2014 Physical Exam  Constitutional: She is oriented to person, place, and time. She appears well-developed and well-nourished. No distress.  HENT:  Head: Normocephalic and atraumatic.  Handling secretions without issues.  Eyes: EOM are normal. No scleral icterus.  Neck: Neck supple.  Cardiovascular: Normal rate and regular rhythm.   Pulmonary/Chest: Effort normal. No respiratory distress.  Abdominal: Soft. There is tenderness. There is no rebound and no guarding.  Inconsistent abdominal exam. Patient has left lower quadrant pain and right upper quadrant pain with palpation, however does not have discomfort in the same areas. Repeat pressure.  Neurological: She is alert and oriented to person, place, and time.  Skin: Skin is warm and dry. She is not diaphoretic.  Psychiatric: She has a normal mood and affect. Her behavior is normal.  Nursing note and vitals reviewed.   ED Course  Procedures (including critical care time) Labs Review Labs Reviewed  URINALYSIS, ROUTINE W REFLEX MICROSCOPIC - Abnormal; Notable for the following:    APPearance CLOUDY (*)    All other components within normal limits  CBC WITH DIFFERENTIAL/PLATELET - Abnormal; Notable for the following:    Hemoglobin 11.9 (*)    All other components within normal limits  PREGNANCY, URINE  COMPREHENSIVE METABOLIC PANEL  LIPASE, BLOOD    Imaging Review Ct Abdomen Pelvis W Contrast  03/23/2014   CLINICAL DATA:  Abdominal pain and nausea beginning this evening.  EXAM: CT ABDOMEN AND PELVIS WITH CONTRAST  TECHNIQUE: Multidetector CT imaging of the abdomen and pelvis was performed using the standard protocol  following bolus administration of intravenous contrast.  CONTRAST:  100 mL OMNIPAQUE IOHEXOL 300 MG/ML  SOLN  COMPARISON:  CT abdomen and pelvis 05/24/2013.  FINDINGS: A 0.6 cm nodule is seen on the first image along the right major fissure. The lung bases are otherwise clear. No pleural or pericardial effusion.  The gallbladder, liver, spleen, adrenal glands, pancreas and kidneys appear normal. Uterus, adnexa and urinary bladder are unremarkable. The stomach, small and large bowel and appendix appear normal. No lymphadenopathy or fluid is noted. No focal bony abnormality is identified.  IMPRESSION: No acute finding in the abdomen or pelvis. No abnormality to explain the patient's symptoms.  0.6 cm nodule along the major fissure on the right major fissure is almost certainly a benign lymph node in this 24 year old. If the patient is at high risk for bronchogenic carcinoma, follow-up chest CT at 6-12 months is recommended. If the patient is at low risk for bronchogenic carcinoma, follow-up chest CT at 12 months is recommended. This recommendation follows  the consensus statement: Guidelines for Management of Small Pulmonary Nodules Detected on CT Scans: A Statement from the [employer] as published in Radiology 2005;237:395-400.   Electronically Signed   By: Inge Rise M.D.   On: 03/23/2014 22:41     EKG Interpretation None      MDM   Final diagnoses:  Left sided abdominal pain  Nausea  Incidental lung nodule   Patient presents with abdominal pain and nausea. Patient's abdominal exam very inconsistent she has left upper and  left mid abdominal discomfort, along with right upper. Labs unremarkable. Re-eval patient refused medication in the ED.  She reports persistent nausea no emesis in the ED.   Dr. Betsey Holiday also evaluated the patient in the ED, plan on CT. Re-eval patient reports persistent nausea no emesis. She also reports she is unable to take medication due to her "choking". Patient  is handling secretions at this time. Discussed this with the patient and CT results. Plan to discharge back to behavioral health.     Harvie Heck, PA-C 03/24/14 9509  Orpah Greek, MD 03/24/14 [phone]
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
## 2014-03-23 NOTE — ED Notes (Signed)
Pt comes from [facility] with abdominal pain and nausea starting this evening around 1700. Pt denies v/d. Pt given 4mg  of Zofran in route and states she feels better.

## 2014-03-23 NOTE — Progress Notes (Signed)
Patient ID: [name], female   DOB: 1990-12-22, 24 y.o.   MRN: [id number] Pt transported to [facility] ED  1-1 staff went with her.

## 2014-03-23 NOTE — ED Provider Notes (Signed)
Patient presented to the ER with abdominal pain. Patient reports nausea , poor appetite, inability to eat and drink. Pain has been diffuse and moving around her abdomen. She reports as a sharp, crampy pain.  Face to face Exam: HEENT - PERRLA Lungs - CTAB Heart - RRR, no M/R/G Abd - soft, nondistended. Left upper quadrant tenderness without guarding or rebound Neuro - alert, oriented x3  Plan: Patient with abdominal pain of unclear etiology. She has had associated nausea and vomiting. All blood work is unremarkable. Obtain CT scan, anticipate return to [facility] if negative.   Orpah Greek, MD 03/23/14 2129

## 2014-03-23 NOTE — BHH Group Notes (Signed)
[facility] Group Notes:  (Counselor/Nursing/MHT/Case Management/Adjunct)  03/23/2014 1:15PM  Type of Therapy:  Group Therapy  Participation Level: Invited but did not attend.  Pt was sobbing in her bed.  Summary of Progress/Problems: The topic for group was balance in life.  Pt participated in the discussion about when their life was in balance and out of balance and how this feels.  Pt discussed ways to get back in balance and short term goals they can work on to get where they want to be.    Trish Mage 03/23/2014 12:31 PM

## 2014-03-23 NOTE — Progress Notes (Signed)
Patient ID: [name], female   DOB: Feb 27, 1990, 24 y.o.   MRN: [id number] PT. recommended that she be seen by occupational therapy.

## 2014-03-23 NOTE — BHH Group Notes (Signed)
[facility] Group Notes:  (Nursing/MHT/Case Management/Adjunct)  Date:  03/23/2014  Time:  11:08 AM  Type of Therapy:  Nurse Education  Participation Level:  Did Not Attend  Participation Quality:  Did not attend  Affect:  Did not attend  Cognitive:  Did not attend  Insight:  None  Engagement in Group:  None  Modes of Intervention:  Discussion and Education  Summary of Progress/Problems: The purpose of this group is to discuss the topic of the day which is Recovery. The patient was invited to group but did not attend.
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
## 2014-03-23 NOTE — ED Notes (Signed)
Pt refusing meds stating she has difficulty swallowing. PA aware.

## 2014-03-23 NOTE — Progress Notes (Signed)
[facility] MD Progress Note  03/23/2014 6:14 PM [name]  MRN:  [id number] Subjective:  [name] had been complaining of pain in her right shoulder after being restrained. She was not responding to the ibuprofen or the tylenol. She started to escalate. She stated she could take Hydrocodone. She states she did not sleep too well last night due to the pain but that the nightmares are not there anymore and that the voices are gone. She later complained of acute abdominal pain and wanted to be taken to the ED for an evaluation Principal Problem: Schizoaffective disorder, bipolar type Diagnosis:   Patient Active Problem List   Diagnosis Date Noted  . Prolonged QT syndrome [I45.81]   . Schizoaffective disorder, unspecified type [F25.9]   . EKG abnormality [R94.31] 03/12/2014  . Schizoaffective disorder, bipolar type [F25.0] 03/10/2014  . Borderline personality disorder [F60.3] 03/10/2014  . PTSD (post-traumatic stress disorder) [F43.10] 03/10/2014  . Asperger's syndrome [F84.5] 03/10/2014   Total Time spent with patient: 30 minutes   Past Medical History:  Past Medical History  Diagnosis Date  . Asthma   . Depression   . Schizoaffective disorder, bipolar type   . Autism   . Essential tremor   . Chronic constipation   . Acid reflux     Past Surgical History  Procedure Laterality Date  . Mouth surgery     Family History:  Family History  Problem Relation Age of Onset  . Mental illness Father   . PDD Brother   . Seizures Brother    Social History:  History  Alcohol Use No     History  Drug Use No    History   Social History  . Marital Status: Single    Spouse Name: N/A  . Number of Children: N/A  . Years of Education: N/A   Social History Main Topics  . Smoking status: Never Smoker   . Smokeless tobacco: Not on file  . Alcohol Use: No  . Drug Use: No  . Sexual Activity: Yes   Other Topics Concern  . None   Social History Narrative   Additional History:     Sleep: Poor  Appetite:  Poor   Assessment:   Musculoskeletal: Strength & Muscle Tone: decreased range of motion right shoulder Gait & Station: unsteady Patient leans: N/A   Psychiatric Specialty Exam: Physical Exam  Review of Systems  Constitutional: Negative.   Eyes: Negative.   Respiratory: Negative.   Cardiovascular: Negative.   Gastrointestinal: Positive for abdominal pain.  Genitourinary: Negative.   Musculoskeletal: Positive for joint pain and neck pain.  Skin: Negative.   Neurological: Negative.   Endo/Heme/Allergies: Negative.   Psychiatric/Behavioral: Positive for suicidal ideas. The patient is nervous/anxious and has insomnia.     Blood pressure 111/64, pulse 85, temperature 97.6 F (36.4 C), temperature source Oral, resp. rate 20, height 5\' 5"  (1.651 m), weight 83.9 kg (184 lb 15.5 oz), last menstrual period 03/17/2014, SpO2 99 %.Body mass index is 30.78 kg/(m^2).  General Appearance: Fairly Groomed  Engineer, water::  Fair  Speech:  Clear and Coherent  Volume:  Normal  Mood:  Anxious and states she is in pain  Affect:  anxious worried  Thought Process:  Coherent and Goal Directed  Orientation:  Full (Time, Place, and Person)  Thought Content:  symptoms events worries concerns  Suicidal Thoughts:  No  Homicidal Thoughts:  No  Memory:  Immediate;   Fair Recent;   Fair Remote;   Fair  Judgement:  Fair  Insight:  Shallow  Psychomotor Activity:  Restlessness  Concentration:  Fair  Recall:  [employer] of Knowledge:Fair  Language: Fair  Akathisia:  No  Handed:  Right  AIMS (if indicated):     Assets:  Desire for Improvement  ADL's:  Intact  Cognition: WNL  Sleep:  Number of Hours: 4.5     Current Medications: Current Facility-Administered Medications  Medication Dose Route Frequency Provider Last Rate Last Dose  . acetaminophen (TYLENOL) tablet 650 mg  650 mg Oral Q6H PRN Laverle Hobby, PA-C   650 mg at 03/23/14 2831  . alum & mag  hydroxide-simeth (MAALOX/MYLANTA) 200-200-20 MG/5ML suspension 30 mL  30 mL Oral Q4H PRN Laverle Hobby, PA-C      . benztropine (COGENTIN) tablet 1 mg  1 mg Oral BID PRN Nicholaus Bloom, MD      . carbamazepine (TEGRETOL XR) 12 hr tablet 200 mg  200 mg Oral BID Nicholaus Bloom, MD   200 mg at 03/23/14 0856  . clonazePAM (KLONOPIN) tablet 0.25 mg  0.25 mg Oral BID PRN Laverle Hobby, PA-C   0.25 mg at 03/23/14 0353  . clonazePAM (KLONOPIN) tablet 1 mg  1 mg Oral QHS Laverle Hobby, PA-C   1 mg at 03/22/14 2139  . docusate sodium (COLACE) capsule 100 mg  100 mg Oral QHS Jenne Campus, MD   100 mg at 03/22/14 2139  . escitalopram (LEXAPRO) tablet 20 mg  20 mg Oral Daily Nicholaus Bloom, MD   20 mg at 03/23/14 0858  . feeding supplement (ENSURE COMPLETE) (ENSURE COMPLETE) liquid 237 mL  237 mL Oral TID BM Nicholaus Bloom, MD   237 mL at 03/23/14 1253  . ferrous sulfate tablet 325 mg  325 mg Oral Q supper Laverle Hobby, PA-C   325 mg at 03/22/14 1708  . HYDROcodone-acetaminophen (NORCO) 10-325 MG per tablet 1 tablet  1 tablet Oral Q6H PRN Nicholaus Bloom, MD   1 tablet at 03/23/14 1203  . HYDROcodone-acetaminophen (NORCO) 10-325 MG per tablet           . hydrOXYzine (ATARAX/VISTARIL) tablet 25 mg  25 mg Oral Q4H PRN Laverle Hobby, PA-C   25 mg at 03/21/14 2112  . ibuprofen (ADVIL,MOTRIN) tablet 800 mg  800 mg Oral Q8H PRN Laverle Hobby, PA-C   800 mg at 03/23/14 5176  . lamoTRIgine (LAMICTAL) tablet 50 mg  50 mg Oral Daily Laverle Hobby, PA-C   50 mg at 03/23/14 0859  . magnesium hydroxide (MILK OF MAGNESIA) suspension 30 mL  30 mL Oral Daily PRN Laverle Hobby, PA-C      . metoprolol tartrate (LOPRESSOR) tablet 25 mg  25 mg Oral BID Laverle Hobby, PA-C   25 mg at 03/23/14 0858  . metroNIDAZOLE (FLAGYL) tablet 500 mg  500 mg Oral Q12H Encarnacion Slates, NP   500 mg at 03/23/14 0857  . pantoprazole (PROTONIX) EC tablet 40 mg  40 mg Oral Daily Laverle Hobby, PA-C   40 mg at 03/23/14 0857  . prazosin  (MINIPRESS) capsule 1 mg  1 mg Oral QHS Nicholaus Bloom, MD   1 mg at 03/22/14 2139  . risperiDONE (RISPERDAL M-TABS) disintegrating tablet 2 mg  2 mg Oral QHS Jenne Campus, MD   2 mg at 03/22/14 2141  . risperiDONE (RISPERDAL) tablet 0.5 mg  0.5 mg Oral q morning - 10a Fernando A Cobos,  MD   0.5 mg at 03/23/14 0900    Lab Results: No results found for this or any previous visit (from the past 48 hour(s)).  Physical Findings: AIMS: Facial and Oral Movements Muscles of Facial Expression: None, normal Lips and Perioral Area: None, normal Jaw: None, normal Tongue: None, normal,Extremity Movements Upper (arms, wrists, hands, fingers): None, normal Lower (legs, knees, ankles, toes): None, normal, Trunk Movements Neck, shoulders, hips: None, normal, Overall Severity Severity of abnormal movements (highest score from questions above): None, normal Incapacitation due to abnormal movements: None, normal Patient's awareness of abnormal movements (rate only patient's report): No Awareness, Dental Status Current problems with teeth and/or dentures?: No Does patient usually wear dentures?: No  CIWA:  CIWA-Ar Total: 4 COWS:  COWS Total Score: 3  Treatment Plan Summary: Daily contact with patient to assess and evaluate symptoms and progress in treatment and Medication management Supportive approach/coping skills Shoulder pain: Hydrocodone (Norco 10/325 mg was ordered Q 6 hours PRN Pain) A PT evaluation was ordered Hallucinations: continue the Risperdal Nightmares: continue the Prazosin Acute abdominal pain: will transfer to the [facility] ED to assess her pain Continue 1:1 Obs Medical Decision Making:  Review of Psycho-Social Stressors (1), Review or order clinical lab tests (1), Review of Medication Regimen & Side Effects (2) and Review of New Medication or Change in Dosage (2)     Valecia Beske A 03/23/2014, 6:14 PM

## 2014-03-23 NOTE — ED Notes (Signed)
Patient ambulated to the bathroom with one person assist. Patient did not have any problem walking.

## 2014-03-23 NOTE — Progress Notes (Signed)
1-1 note. She has been in her room mostly on the bed. She has not requested any prn medication that her rt. Shoulder was feeling better. She has been calm and cooperative this afternoon. 1-1 continues for safety.

## 2014-03-23 NOTE — Progress Notes (Signed)
1-1 note. She has been in room most of afternoon except to make a phone call, has been in room remainder of time . She stated that the prn  Norco 11/3223 given at 1203 pm was effective. Did not c/o pain again till 5pm . C/o headache and abdominal pain right side. Had gone to restroom and she then layed down in floor and said she could notr get up and would not let us help her her up. Spoke with MD and order obtained to send her to [facility] ED for acute abdominal pain.She refused to have her vital signs taken.

## 2014-03-23 NOTE — Progress Notes (Signed)
She is sitting on hallway had been on the phone and is calm. Said that her pain was at a 5 and apologized for her behavior.

## 2014-03-23 NOTE — Progress Notes (Addendum)
1 on 1 note 10:00: she has been up and took AM medications . Returned to her room and has been sitting and talking to her sitter. She has not c/o and discomfort and has been cooperative. 1-1 continues for safety.

## 2014-03-23 NOTE — ED Notes (Signed)
Bed: [facility] Expected date:  Expected time:  Means of arrival:  Comments: Pt from [facility]

## 2014-03-23 NOTE — Evaluation (Signed)
Physical Therapy Evaluation Patient Details Name: [name] MRN: [id number] DOB: January 05, 1991 Today's Date: 03/23/2014   History of Present Illness  patient is 24 yo female admitted 03/17/14  to [facility] with violet behavior, h/o schozophrenic behavior, autism.Per RN has had CIRT intervention multiple times with UE restraints. XRay of R shoulder negative for  fracture or soft tissue injury.   Clinical Impression  Patient present with painful and  Very dysfunctional RUE  With limited ROM tolerated in shoulder elevation. Instructed in self ROM exercises, attempted pendulums but pt. guarding shoulder too much  Due to c/o increased pain. Recommend OT consult for this patient PT will follow until OT is able to evaluate patient. Patient will benefit from PT  To address problems listed in note.    Follow Up Recommendations Outpatient PTor OT.if dysfunction persists at DC, may consider evaluation by orthopedist.    Equipment Recommendations  None recommended by PT    Recommendations for Other Services OT consult (or OP OT)     Precautions / Restrictions Precautions Precaution Comments: can be vilet      Mobility  Bed Mobility Overal bed mobility: Independent                Transfers Overall transfer level: Needs assistance Equipment used: 1 person hand held assist             General transfer comment: pt is quite lethargic and unsteady for standing up,  Ambulation/Gait Ambulation/Gait assistance: Min guard Ambulation Distance (Feet): 50 Feet Assistive device: 1 person hand held assist       General Gait Details: gait is unsteady, recently medicated for pain. RN reports that patien walks well,   Stairs            Wheelchair Mobility    Modified Rankin (Stroke Patients Only)       Balance                                             Pertinent Vitals/Pain Pain Assessment: 0-10 Pain Score: 8  Pain Location: R shoulder and across  clavicle Pain Descriptors / Indicators: Aching;Discomfort;Sharp;Shooting;Tender;Tightness Pain Intervention(s): Monitored during session;Premedicated before session;Ice applied    Home Living Family/patient expects to be discharged to::  (boarding house) Living Arrangements: Non-relatives/Friends                    Prior Function Level of Independence: Independent               Hand Dominance        Extremity/Trunk Assessment   Upper Extremity Assessment: RUE deficits/detail RUE Deficits / Details: limited shoulder elevation:forward flexion only to about 50, horizontal adduction nearly no range, abduction 30, much guarding of shoulder  and difficult to  gain any PROM.                   Communication      Cognition Arousal/Alertness: Lethargic;Suspect due to medications Behavior During Therapy: [facility] for tasks assessed/performed Overall Cognitive Status: Within Functional Limits for tasks assessed                      General Comments      Exercises General Exercises - Upper Extremity Shoulder Flexion: Self ROM;Right;5 reps;Supine Shoulder Horizontal ADduction: Self ROM;Right;5 reps;Supine Other Exercises Other Exercises: shoulder  ER/IR x 5 self  ROM,      Assessment/Plan    PT Assessment Patient needs continued PT services (until OT consult is obtained.)  PT Diagnosis Acute pain   PT Problem List Decreased strength;Decreased range of motion;Pain  PT Treatment Interventions Therapeutic activities   PT Goals (Current goals can be found in the Care Plan section) Acute Rehab PT Goals Patient Stated Goal: to get my arm working so I can pull my hau=ir back. PT Goal Formulation: With patient Time For Goal Achievement: 04/06/14 Potential to Achieve Goals: Good    Frequency Min 2X/week   Barriers to discharge        Co-evaluation               End of Session   Activity Tolerance: Patient limited by pain Patient left: in bed;with  nursing/sitter in room Nurse Communication:  (need for OT consult.)    Functional Assessment Tool Used: clinical judgement Functional Limitation: Other PT primary Carrying, Moving and Handling Objects Current Status (S9373): At least 80 percent but less than 100 percent impaired, limited or restricted Carrying, Moving and Handling Objects Goal Status [phone]): At least 1 percent but less than 20 percent impaired, limited or restricted    Time: 1510-1540 PT Time Calculation (min) (ACUTE ONLY): 30 min   Charges:         PT G Codes:   PT G-Codes **NOT FOR INPATIENT CLASS** Functional Assessment Tool Used: clinical judgement Functional Limitation: Other PT primary Carrying, Moving and Handling Objects Current Status (O1157): At least 80 percent but less than 100 percent impaired, limited or restricted Carrying, Moving and Handling Objects Goal Status [phone]): At least 1 percent but less than 20 percent impaired, limited or restricted    Claretha Cooper 03/23/2014, 6:19 PM  Tresa Endo PT [phone]

## 2014-03-23 NOTE — Progress Notes (Signed)
Patient resting quietly with eyes closed. Respirations even and unlabored. No distress noted 1:1 observation continues as ordered to maintain safety.

## 2014-03-23 NOTE — Progress Notes (Signed)
Physical therapy is her to see her at present.she is being cooperative with them.

## 2014-03-23 NOTE — Progress Notes (Signed)
Adult Psychoeducational Group Note  Date:  03/23/2014 Time:  1:17 AM  Group Topic/Focus:  Wrap-Up Group:   The focus of this group is to help patients review their daily goal of treatment and discuss progress on daily workbooks.  Participation Level:  Did Not Attend  Additional Comments:  Pt was in her room lying in bed at the time of group.  Clint Bolder 03/23/2014, 1:17 AM

## 2014-03-24 ENCOUNTER — Other Ambulatory Visit: Payer: Self-pay

## 2014-03-24 NOTE — Progress Notes (Addendum)
Patient ID: [name], female   DOB: 1990-11-30, 24 y.o.   MRN: [id number]  Pt presents with a flat affect and needy/shildlike behavior. Pt remains on 1:1 continuous monitoring per MD orders. Pt has been in bed for most of the day or on the phone with her fiance. Pt reports daily goal to "control my anger and voices" and intends to do so by talk to staff." Pt rates depression 2, hopelessness 0 and anxiety at a 6.  Pt presents with numerous somatic signs and symptoms throughout the day. Pt breathing unlabored and regular. Pt in no current distress. Will continue to monitor.   Pt provided with medications per providers orders. Pt's labs and vitals were monitored throughout the day. Pt supported emotionally and encouraged to express concerns and questions. Pt educated on medications and diet/nutrition.  Pt's safety ensured even more with 15 minute and environmental checks. Pt currently denies SI/HI and A/V hallucinations. Pt verbally agrees to seek staff if SI/HI or A/VH occurs and to consult with staff before acting on these thoughts.

## 2014-03-24 NOTE — Progress Notes (Signed)
Patient ID: [name], female   DOB: [birth year], 24 y.o.   MRN: [id number]  1:1 Note: Pt repositioned in bed, with pillows and reports "feeling sleepy." Sitter at bedside. Pt breathing unlabored and regular. Will continue to monitor.

## 2014-03-24 NOTE — Progress Notes (Signed)
Pt is at the [facility] for evaluation of unrelieved pain to her R shoulder and abdominal pain.  Sitter with pt at the ED.

## 2014-03-24 NOTE — Progress Notes (Signed)
The focus of this group is to help patients review their daily goal of treatment and discuss progress on daily workbooks. Pt shared that her goal today was to control her and not spit on or hit anyone. Pt stated that she accomplished this goal. Pt shared that she accomplished this goal by talking to staff about her feelings rather than acting out. Pt stated that she had a second goal of eating more. Pt shared that she did well toward this goal in that she ate applesauce and drank ensure, which is an improvement over the past few days. Pt also shared that her guardian visited her today, which made her happy.

## 2014-03-24 NOTE — Progress Notes (Signed)
Patient ID: [name], female   DOB: 1990/05/01, 24 y.o.   MRN: [id number]   1:1 Note: Pt in laying bed talking with sitter. Sitter at the bedside. Pt talking about the exercises PT explained to her yesterday. Will continue to monitor.

## 2014-03-24 NOTE — [facility] Group Notes (Signed)
[facility] LCSW Aftercare Discharge Planning Group Note   03/24/2014 10:08 AM  Participation Quality:  Invited. Did not attend.    Hyatt,Candace
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
## 2014-03-24 NOTE — Progress Notes (Addendum)
1:1 progress note:  Pt resting in bed with eyes closed.  No distress observed.  Respirations even/unlabored.  Continue 1:1 for impulsive behaviors.  Sitter with pt.  Pt safe at this time.

## 2014-03-24 NOTE — Progress Notes (Signed)
Pt continues to rest in bed with her eyes closed.  No distress observed.  Respirations even/unlabored.  Pt was up around 0515 requesting pain medication which she was given at that time, along with an ice pack.  Pt remained calm and cooperative with the med administration.  Pt continues on 1:1 for safety.  Sitter at bedside.  Pt remains safe.

## 2014-03-24 NOTE — Progress Notes (Signed)
Pt resting in bed with eyes closed.  No distress observed.  Respirations even/unlabored.  Continue 1:1 for safety d/t impulsive behaviors.  Sitter at bedside.  Pt safe at this time.
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
## 2014-03-24 NOTE — Progress Notes (Signed)
Patient ID: [name], female   DOB: 1990/10/24, 24 y.o.   MRN: [id number]  1:1 Note: Pt in bed talking with sitter at bedside. Pt states "I would like to talk to my fiance since I haven't been able to this morning. I keep forgetting." Pt stable and cooperative. Will continue to monitor.    Elenore Rota, RN

## 2014-03-24 NOTE — [facility] Group Notes (Signed)
[place] LCSW Group Therapy  03/24/2014 1:47 PM  Type of Therapy:  Group Therapy  Participation Level:  Did Not Attend  Summary of Progress/Problems:[facility]) speaker came to talk about his personal journey with substance abuse and mental illness. Group members were challenged to process ways by which to relate to the speaker. [place] speaker provided handouts and educational information pertaining to groups and services offered by the [facility]   Hyatt,Candace 03/24/2014, 1:47 PM

## 2014-03-24 NOTE — Progress Notes (Signed)
Pt returned to the unit from the ED.  Pt received a GI cocktail at the ED along with IV zofran and morphine.  Pt refused her hs meds while over at the ED, but requested them when she returned to [facility].  Pt was given her 2000 and 2200 meds.  Pt is asking about the Norco she has prn, but was informed that it would be 0400 before she could have it as she had received morphine while at the ED.  Pt was agreeable to this information.  No acute findings were reported by the ED for her symptoms.  Pt is calm and cooperative with staff upon arrival to [facility].  Pt continues on 1:1 for her impulsive behaviors.  Pt safe at this time.

## 2014-03-25 MED ORDER — BIOTENE DRY MOUTH MT LIQD
15.0000 mL | OROMUCOSAL | Status: DC | PRN
  Administered 2014-03-25 – 2014-03-26 (×2): 15 mL via OROMUCOSAL
  Filled 2014-03-25 (×3): qty 15

## 2014-03-25 NOTE — Progress Notes (Signed)
Discussed c/o persistent right shoulder pain with Dr Maureen Ralphs MD (Ortho group).  Ordered MRI to r/o any soft tissue involvement. I agree with assessment and plan Geralyn Flash A. Sabra Heck, M.D.

## 2014-03-25 NOTE — Progress Notes (Signed)
1:1 note- Pt resting in bed with eyes closed.  No distress observed.  Respirations even/unlabored.  Around 2330, pt was awake asking for pain medication which she was given.  Pt went back to sleep shortly after receiving the medication.  Continue 1:1 for safety d/t impulsive/aggressive behaviors.  Sitter at bedside.  Pt safe at this time.

## 2014-03-25 NOTE — [facility] Group Notes (Signed)
[place] Group Notes:  (Counselor/Nursing/MHT/Case Management/Adjunct)  03/25/2014 1:15PM  Type of Therapy:  Group Therapy  Participation Level:Invited.  Did not attend  Summary of Progress/Problems: The topic for group was balance in life.  Pt participated in the discussion about when their life was in balance and out of balance and how this feels.  Pt discussed ways to get back in balance and short term goals they can work on to get where they want to be.    Trish Mage 03/25/2014 12:28 PM

## 2014-03-25 NOTE — Progress Notes (Signed)
Patient ID: [name], female   DOB: 07/02/1990, 24 y.o.   MRN: [id number] PER STATE REGULATIONS 482.30  THIS CHART WAS REVIEWED FOR MEDICAL NECESSITY WITH RESPECT TO THE PATIENT'S ADMISSION/ DURATION OF STAY.  NEXT REVIEW DATE: 03/29/2014  Chauncy Lean, RN, BSN CASE MANAGER

## 2014-03-25 NOTE — Evaluation (Signed)
Occupational Therapy Evaluation Patient Details Name: [name] MRN: [id number] DOB: July 16, 1990 Today's Date: 03/25/2014    History of Present Illness patient is 24 yo female admitted 03/17/14  to [facility] with violet behavior, h/o schozophrenic behavior, autism.Per RN has had CIRT intervention multiple times with UE restraints. XRay of R shoulder negative for  fracture or soft tissue injury.    Clinical Impression   PT seen for initial evaluation:  Pt was very guarded and needs min A for ADLs.  Will  Follow in acute for ADLs, UE ROM as tolerated.  Pt was independent with adls prior to admission    Follow Up Recommendations  Home health OT;Outpatient OT (pt is very guarded:  may need orthopedic consult for soft tissue injury)    Equipment Recommendations   (shower seat)    Recommendations for Other Services       Precautions / Restrictions Precautions Precautions: None Restrictions Weight Bearing Restrictions: No      Mobility Bed Mobility Overal bed mobility: Modified Independent (extra time)                Transfers                 General transfer comment: NT:  only got to EOB    Balance                                            ADL Overall ADL's : Needs assistance/impaired Eating/Feeding: Set up (occasional set up)   Grooming: Minimal assistance;Sitting (to put hair up)   Upper Body Bathing: Minimal assitance;Sitting   Lower Body Bathing: Set up;Sit to/from stand   Upper Body Dressing : Set up;Sitting   Lower Body Dressing: Set up;Sit to/from stand                       Vision     Perception     Praxis      Pertinent Vitals/Pain Pain Assessment: 0-10 Pain Score: 9  Pain Location: R shoulder Pain Descriptors / Indicators: Aching Pain Intervention(s): Limited activity within patient's tolerance;Monitored during session;Premedicated before session;Repositioned;Ice applied     Hand Dominance Right    Extremity/Trunk Assessment Upper Extremity Assessment Upper Extremity Assessment: RUE deficits/detail RUE Deficits / Details: pt is very guarded with RUE.  She reports pain in R shoulder, anterior aspect is sensitive to touch.  She is able to actively move RUE approximately 50 degrees; and perform SROM to 90.  She can abduct 30 degrees and perform external rotation to neutral in gravity eliminated plane.  Pt is very guarded and has elevated traps.  She is keeping elbow in flexion, only moving wrist into 10 degrees of extension and fingers approximately 1/2 fist.  Pt reports decreased sensation:  in all finger tips.  During sensory test for light touch, she shook head "no" at times when I touched R fingers after I had touched her.  I did not touch on a regular interval and she did not report "no" when I hadn't touched her.  During functional task (donning sock) she did extend elbow nearly fully           Communication Communication Communication: No difficulties   Cognition Arousal/Alertness: Awake/alert Behavior During Therapy: WFL for tasks assessed/performed Overall Cognitive Status: Within Functional Limits for tasks assessed  General Comments       Exercises   Other Exercises Other Exercises: continued with AROM/SROM for shoulder flexion which PT gave her, within pain-free tolerance.  Added AROM for fingers, wrist, elbow, and scapula.   Shoulder Instructions      Home Living Family/patient expects to be discharged to::  (possibly ALF; was in [facility])                                        Prior Functioning/Environment Level of Independence: Independent             OT Diagnosis: Acute pain;Generalized weakness   OT Problem List: Decreased strength;Decreased range of motion;Decreased activity tolerance;Pain;Impaired UE functional use;Decreased knowledge of use of DME or AE   OT Treatment/Interventions: Self-care/ADL  training;DME and/or AE instruction;Patient/family education;Therapeutic exercise;Therapeutic activities    OT Goals(Current goals can be found in the care plan section) Acute Rehab OT Goals Patient Stated Goal: get R arm better OT Goal Formulation: With patient Time For Goal Achievement: 04/01/14 Potential to Achieve Goals: Fair ADL Goals Additional ADL Goal #1: pt will don bra/shirt donning RUE first to minimize pain with supervision/set up Additional ADL Goal #2: pt will feed herself without dropping utensil with built up foam Additional ADL Goal #3: pt will be independent with written HEP focusing on AROM/self ROM as tolerated  OT Frequency: Min 2X/week   Barriers to D/C:            Co-evaluation              End of Session    Activity Tolerance: Patient limited by pain Patient left: in bed;with call bell/phone within reach;with nursing/sitter in room   Time: 4782-9562 OT Time Calculation (min): 40 min Charges:  OT General Charges $OT Visit: 1 Procedure OT Evaluation $Initial OT Evaluation Tier I: 1 Procedure OT Treatments $Self Care/Home Management : 8-22 mins $Therapeutic Exercise: 8-22 mins G-Codes: OT G-codes **NOT FOR INPATIENT CLASS** Functional Assessment Tool Used: clinical observation and judgment Functional Limitation: Self care Self Care Current Status (Z3086): At least 1 percent but less than 20 percent impaired, limited or restricted Self Care Goal Status (V7846): At least 1 percent but less than 20 percent impaired, limited or restricted  [facility] 03/25/2014, 3:42 PM  Lesle Chris, OTR/L [phone] 03/25/2014

## 2014-03-25 NOTE — Progress Notes (Addendum)
Patient ID: [name], female   DOB: [birth year], 24 y.o.   MRN: [id number]  Pt presents with a flat affect and anxious behavior. Pt states throughout the day "I have no energy, I just want to lay here." Pt remains on a 1:1 due to impulsive behavior. Will continue to monitor. Pt states that she does not want to eat because she isn't hungry and "gag when I watch other people eat." Pt complains of L shoulder pain throughout the day. Pt can be seen leaning on her L arm while repositioning in bed and picking up her cup with both hands.   Pt provided with medications per providers orders. Pt's labs and vitals were monitored throughout the day. Pt supported emotionally and encouraged to express concerns and questions. Pt educated on medications and diet/nutrition.  Pt's safety ensured with 15 minute and environmental checks. Pt currently denies SI/HI and A/V hallucinations. Pt verbally agrees to seek staff if SI/HI or A/VH occurs and to consult with staff before acting on these thoughts. Pt encouraged to eat and drink more fluids today. Dietary consult came to see pt however pt was asleep. OT visited pt today as well. Order for daily EKG modified to every other day per MD.

## 2014-03-25 NOTE — Progress Notes (Signed)
Patient ID: [name], female   DOB: 1990/05/02, 24 y.o.   MRN: [id number]  1:1 Note - Pt is resting in bed. Pt reports that "I'm just resting, I'm tired after working so hard." Pt referring to OT consult. Pt stable at this time. Sitter at the bedside. Will continue to monitor.
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
## 2014-03-25 NOTE — Progress Notes (Signed)
PT Note: PT discharge  Patient Details Name: [name] MRN: [id number] DOB: 1990-11-18     PT to sign off at this time due to OT now ordered and helping address the patient's shoulder issue.  Thank you.    Clide Dales 03/25/2014, 5:08 PM  Clide Dales, PT Pager: [phone] 03/25/2014

## 2014-03-25 NOTE — Progress Notes (Signed)
Patient ID: [name], female   DOB: February 22, 1990, 24 y.o.   MRN: [id number]  1:1 Note: Pt in room asleep. Pt awoken and given scheduled medications. Pt cooperative and anxious. Sitter at bedside. Pt in no current distress. Will continue to monitor.

## 2014-03-25 NOTE — Progress Notes (Signed)
NUTRITION ASSESSMENT  RD consulted for patient with poor PO intake.  INTERVENTION: 1. Supplements: Continue Ensure Complete po TID, each supplement provides 350 kcal and 13 grams of protein   NUTRITION DIAGNOSIS: Inadequate oral intake related to poor appetite as evidenced by pt report.   Goal: Pt to meet >/= 90% of their estimated nutrition needs.  Monitor:  PO intake  Assessment:  Pt admitted with schizoaffective disorder and PTSD.  RD attempted to see patient but pt was asleep. RD spoke with tech in room, per tech pt has not been eating well. Pt has only had applesauce today with her medications. Pt is reporting gagging when eating. Pt is drinking Ensure supplements.   Per weight history, pt with 5 lb weight loss since 2/02.   Agree with current interventions and RD to try to follow-up with patient at a later date.  Height: Ht Readings from Last 1 Encounters:  03/17/14 5\' 5"  (1.651 m)    Weight: Wt Readings from Last 1 Encounters:  03/17/14 184 lb 15.5 oz (83.9 kg)    Weight Hx: Wt Readings from Last 10 Encounters:  03/17/14 184 lb 15.5 oz (83.9 kg)  03/09/14 189 lb (85.73 kg)  03/04/14 185 lb (83.915 kg)  11/27/13 180 lb (81.647 kg)  10/24/13 177 lb (80.287 kg)  09/21/13 177 lb (80.287 kg)  08/02/13 173 lb (78.472 kg)    BMI:  Body mass index is 30.78 kg/(m^2). Pt meets criteria for obesity based on current BMI.  Estimated Nutritional Needs: Kcal: 25-30 kcal/kg Protein: > 1 gram protein/kg Fluid: 1 ml/kcal  Diet Order: Diet regular Pt is also offered choice of unit snacks mid-morning and mid-afternoon.  Pt is eating as desired.   Lab results and medications reviewed.   Clayton Bibles, MS, RD, LDN Pager: [phone] After Hours Pager: [phone]

## 2014-03-25 NOTE — Progress Notes (Signed)
Pt has remained in her room most of the shift, but did come out for group this evening.  Pt reports she is still having severe pain to her R shoulder and is receiving pain medication prn as order.  Pt denies SI/HI/AV at the time of the shift assessment.  Pt is being cooperative with staff and seems to be responding favorably to her medications.  She is still somatically focused most of the time.  Pt makes her needs known to staff.  She continues on 1:1 for her impulsive behaviors.  Support and encouragement offered.  Pt remains safe with 1:1 observation.

## 2014-03-25 NOTE — Progress Notes (Signed)
Adult Psychoeducational Group Note  Date:  03/25/2014 Time: 09:30  Group Topic/Focus:  Orientation:   The focus of this group is to educate the patient on the purpose and policies of crisis stabilization and provide a format to answer questions about their admission.  The group details unit policies and expectations of patients while admitted.  Participation Level:  Did Not Attend  Participation Quality: n/a  Affect:  n/a  Cognitive:  n/a  Insight: n/a  Engagement in Group:  n/a  Modes of Intervention:  Discussion, Education, and Support  Additional Comments:  Pt did not attend group. Pt in bed, asleep.   Elenore Rota 03/25/2014, 12:10 PM

## 2014-03-25 NOTE — Progress Notes (Signed)
1:1 note- Pt is up and "organizing " things in her room. She says she woke up a short time ago and could not get back to sleep.  Pt says otherwise she slept well.  Pt is loud when she talks and could be heard all the way to the NS.  She has remained calm and cooperative. Pt is receiving her prn pain med at this time.  Pt makes her needs known to staff.  She continues on 1:1 for safety.  Sitter with patient.  Pt remains safe.

## 2014-03-25 NOTE — Progress Notes (Signed)
1:1 note- Pt has been in her room all evening.  She still c/o R shoulder pain and tells this Probation officer that OT came to see her and she is going to the hospital tomorrow to have an MRI done on her shoulder.  She also has been observed limping when she comes out of her room to go to the dayroom, but she has not mentioned any pain to her lower extremities.  Pt has remained calm and cooperative this evening with staff.  She takes her meds with applesauce and "throws" herself back on her bed when she swallows saying that it helps her not gag when she swallows.  Pt makes her needs known to staff.  Support and encouragement offered.  Pt continues on 1:1 for safety.  Sitter at bedside.  Pt safe at this time.

## 2014-03-26 ENCOUNTER — Ambulatory Visit (HOSPITAL_COMMUNITY)
Admit: 2014-03-26 | Discharge: 2014-03-26 | Disposition: A | Discharge status: Home / Self Care | Payer: Medicare Other | Attending: Nurse Practitioner | Admitting: Nurse Practitioner

## 2014-03-26 ENCOUNTER — Ambulatory Visit (HOSPITAL_COMMUNITY): Payer: Medicare Other

## 2014-03-26 ENCOUNTER — Other Ambulatory Visit: Payer: Self-pay

## 2014-03-26 DIAGNOSIS — S46811A Strain of other muscles, fascia and tendons at shoulder and upper arm level, right arm, initial encounter: Secondary | ICD-10-CM | POA: Diagnosis not present

## 2014-03-26 MED ORDER — TUBERCULIN PPD 5 UNIT/0.1ML ID SOLN: 5.0000 [IU] | Freq: Once | INTRADERMAL | Status: DC

## 2014-03-26 MED ORDER — RISPERIDONE 0.5 MG PO TBDP
2.5000 mg | ORAL_TABLET | Freq: Every day | ORAL | Status: DC
  Administered 2014-03-26: 2.5 mg via ORAL
  Filled 2014-03-26 (×2): qty 5

## 2014-03-26 NOTE — Progress Notes (Signed)
Nutrition Follow-up  RD attempted to follow-up with patient today but pt was asleep. Spoke with tech in room, pt has not been eating. Pt has eaten applesauce with medications and is sipping on Ensure supplements.  Continue Ensure Complete po TID, each supplement provides 350 kcal and 13 grams of protein  No further nutrition interventions warranted at this time. If other nutrition issues arise, please consult RD.   Clayton Bibles, MS, RD, LDN Pager: [phone] After Hours Pager: [phone]

## 2014-03-26 NOTE — Progress Notes (Signed)
Patient ID: [name], female   DOB: 16-Jul-1990, 24 y.o.   MRN: [id number]  1:1 Note - Pt in bed, speaking with MD. Pt in no current distress. Pt complaint of sore throat. Pt given throat lozenge. Sitter at bedside. Will continue to monitor.

## 2014-03-26 NOTE — Plan of Care (Signed)
Problem: Alteration in thought process Goal: LTG-Patient has not harmed self or others in at least 2 days Outcome: Completed/Met Date Met:  03/26/14 Pt has not harmed self or other for the last three days and has been able to control her physically aggressive behaviors.

## 2014-03-26 NOTE — Progress Notes (Signed)
Occupational Therapy Treatment Patient Details Name: [name] MRN: [id number] DOB: May 28, 1990 Today's Date: 03/26/2014    History of present illness patient is 24 yo female admitted 03/17/14  to [facility] with violet behavior, h/o schozophrenic behavior, autism.Per RN has had CIRT intervention multiple times with UE restraints. XRay of R shoulder negative for  fracture or soft tissue injury.    OT comments  Pt much more off balance today.  Tolerated activities well  Follow Up Recommendations  Home health OT;Outpatient OT (OP if pt has transportation)    Equipment Recommendations  3 in 1 bedside comode;Tub/shower seat    Recommendations for Other Services      Precautions / Restrictions Precautions Precautions: None       Mobility Bed Mobility Overal bed mobility: Independent                Transfers Overall transfer level: Needs assistance Equipment used: None Transfers: Sit to/from Stand Sit to Stand: Min guard         General transfer comment: for safety    Balance                                   ADL                                         General ADL Comments: today, pt did not involve RUE with donning socks:  initially held sock, then let go and donned with L.  Brought built up foam and pt was able to manipulate it to eat a little yogurt. She has a very poor appetite right now.  Used built up foam to brush teeth at sink with RUE.  Pt reports that she got dressed placing RUE into shirt first and that this was easier.  Brought a pencil with foam to practice handwriting.  Squeeze ball given for hand grip.  ROM has not changed since yesterday.  She performed AROM for FF and SROM.  Pt is still guarded with shoulder elevation:  she was able to relax scapula downward a little with tactile cues.  she cannot retract (reports pain anterior part of shoulder).  Pt is going to have MRI later today.  Encouraged her to work within pain-free  tolerance.  Have been encouraging pt to relax elbow in bed--when she tries to do this consciously, it is painful, however, when getting OOB, she relaxed arm to approximately -30 extension without complaint.  Pt reports dizziness today and was very unbalanced walking to bathroom:  min A given and OT helped her catch balance at least 4x's.  Foam and ball given to RN to lock up.      Vision                     Perception     Praxis      Cognition   Behavior During Therapy: [facility] for tasks assessed/performed Overall Cognitive Status: Within Functional Limits for tasks assessed                       Extremity/Trunk Assessment               Exercises     Shoulder Instructions       General Comments      Pertinent Vitals/  Pain       Pain Assessment: Faces Faces Pain Scale: Hurts even more Pain Location: R shoulder Pain Descriptors / Indicators: Aching Pain Intervention(s): Limited activity within patient's tolerance;Monitored during session;Ice applied  Home Living                                          Prior Functioning/Environment              Frequency Min 2X/week     Progress Toward Goals  OT Goals(current goals can now be found in the care plan section)  Progress towards OT goals: Progressing toward goals     Plan      Co-evaluation                 End of Session     Activity Tolerance Patient tolerated treatment well   Patient Left in bed;with call bell/phone within reach;with nursing/sitter in room   Nurse Communication          Time: 1308-6578 OT Time Calculation (min): 32 min  Charges: OT General Charges $OT Visit: 1 Procedure OT Treatments $Self Care/Home Management : 8-22 mins $Therapeutic Exercise: 8-22 mins  Arlee Bossard 03/26/2014, 12:12 PM  Marica Otter, OTR/L [phone] 03/26/2014

## 2014-03-26 NOTE — Progress Notes (Signed)
Patient ID: [name], female   DOB: 22-Feb-1990, 24 y.o.   MRN: [id number]  Pt sent to [facility] for MRI appointment today at 1400. Pt sent with non-emergent transport team and [employer]. Pt sent with IVC copied paperwork and transport report. Pt is stable and in no distress. Pt given pain medication and anxiety medication prior to transport, see MAR. Enzo Bi, MHT sent with patient to the ED. Camera operator and security notified.

## 2014-03-26 NOTE — Progress Notes (Signed)
Pt is lying in bed quietly with her eyes closed, but she is still awake.  Pt voices no needs at this time.  She has remained in her room this evening.   She has received her scheduled meds along with her prn pain medication.  She has an ice pack to her R shoulder.  Pt has been calm and cooperative with staff.  Pt makes her needs known.  Support and encouragement offered.  Continue 1:1 for safety.  Sitter at bedside.  Pt safe at this time.

## 2014-03-26 NOTE — B[facility] Group Notes (Signed)
[place] LCSW Group Therapy  03/26/2014 1:22 PM  Type of Therapy:  Group Therapy  Participation Level:  Was unavailable for group-medical attention    Summary of Progress/Problems: Today's topic focused on themes such as hope and care.    Hyatt,Candace 03/26/2014, 1:22 PM
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
## 2014-03-26 NOTE — Progress Notes (Addendum)
Patient ID: [name], female   DOB: 06-10-1990, 24 y.o.   MRN: [id number]  Pt currently presents with a flat affect and anxious behavior. Per self inventory, pt rates depression at a 1, hopelessness 0 and anxiety 9. Pt's daily goal is to "control anger and eating more" and they intend to do so by "take small bites." Pt reports poor sleep, poor concentration and a poor appetite. Pt reports "If they can pull the car up and if she can leave a little early for the MRI? Ask Dr about sleep medication?" Pt reports that "getting mad and spitting" would cause her to not be able to follow her discharge plan.    Pt provided with medications per providers orders. Pt's labs and vitals were monitored throughout the day. Pt supported emotionally and encouraged to express concerns and questions. Pt educated on medications and procedures.  Pt's safety ensured with 15 minute and environmental checks. Pt currently denies SI/HI and A/V hallucinations. Pt verbally agrees to seek staff if SI/HI or A/VH occurs and to consult with staff before acting on these thoughts. Pt currently on a 1:1. Pt sent for an MRI at [facility] today, see results review.

## 2014-03-26 NOTE — Plan of Care (Signed)
Problem: Alteration in thought process Goal: STG-Patient is able to follow short directions Outcome: Completed/Met Date Met:  03/26/14 For the past three days, pt has been able to follow short directions and cooperate with staff with out acting out or spitting.

## 2014-03-26 NOTE — Progress Notes (Addendum)
Patient ID: [name], female   DOB: [birth year], 24 y.o.   MRN: [id number]  1:1 Note - Pt observed at 1801. Pt is currently in bed asleep. Sitter at bedside. No current distress noted, respirations even and unlabored. Will continue to monitor.

## 2014-03-26 NOTE — Progress Notes (Signed)
[facility] MD Progress Note  03/26/2014 5:08 PM NYLEAH MCGINNIS  MRN:  [id number] Subjective:  Ceaira has continued to complain of pain in her shoulder and limitation in what she can do by herself needing help to carry out some of her ADL's . She did not sleep as well last night. OT has continued to come and a shoulder MRI was performed. There was an attempt to D/C the 1:1 what Atisha did not take well getting involved in some acting out behavior. She still endorses that the voices are gone. Not sure what went on last night that she could not sleep as well. Admits she gets to worry "obsess" " I have autism you know"  Principal Problem: Schizoaffective disorder, bipolar type Diagnosis:   Patient Active Problem List   Diagnosis Date Noted  . Prolonged QT syndrome [I45.81]   . Schizoaffective disorder, unspecified type [F25.9]   . EKG abnormality [R94.31] 03/12/2014  . Schizoaffective disorder, bipolar type [F25.0] 03/10/2014  . Borderline personality disorder [F60.3] 03/10/2014  . PTSD (post-traumatic stress disorder) [F43.10] 03/10/2014  . Asperger's syndrome [F84.5] 03/10/2014   Total Time spent with patient: 30 minutes   Past Medical History:  Past Medical History  Diagnosis Date  . Asthma   . Depression   . Schizoaffective disorder, bipolar type   . Autism   . Essential tremor   . Chronic constipation   . Acid reflux     Past Surgical History  Procedure Laterality Date  . Mouth surgery     Family History:  Family History  Problem Relation Age of Onset  . Mental illness Father   . PDD Brother   . Seizures Brother    Social History:  History  Alcohol Use No     History  Drug Use No    History   Social History  . Marital Status: Single    Spouse Name: N/A  . Number of Children: N/A  . Years of Education: N/A   Social History Main Topics  . Smoking status: Never Smoker   . Smokeless tobacco: Not on file  . Alcohol Use: No  . Drug Use: No  . Sexual Activity: Yes    Other Topics Concern  . None   Social History Narrative   Additional History:    Sleep: Poor  Appetite:  Poor   Assessment:   Musculoskeletal: Strength & Muscle Tone: decreased Gait & Station: normal Patient leans: N/A   Psychiatric Specialty Exam: Physical Exam  Review of Systems  Constitutional: Negative.   HENT: Negative.   Eyes: Negative.   Respiratory: Negative.   Cardiovascular: Negative.   Gastrointestinal: Negative.   Genitourinary: Negative.   Musculoskeletal: Positive for joint pain.  Skin: Negative.   Neurological: Negative.   Endo/Heme/Allergies: Negative.   Psychiatric/Behavioral: The patient is nervous/anxious and has insomnia.     Blood pressure 102/69, pulse 153, temperature 97.6 F (36.4 C), temperature source Oral, resp. rate 20, height 5\' 5"  (1.651 m), weight 83.9 kg (184 lb 15.5 oz), last menstrual period 03/17/2014, SpO2 98 %.Body mass index is 30.78 kg/(m^2).  General Appearance: Fairly Groomed  Engineer, water::  Fair  Speech:  Clear and Coherent  Volume:  fluctuates  Mood:  Anxious and Depressed  Affect:  Restricted  Thought Process:  Coherent and Goal Directed  Orientation:  Full (Time, Place, and Person)  Thought Content:  symptoms worries and concerns  Suicidal Thoughts:  No  Homicidal Thoughts:  No  Memory:  Immediate;   Fair  Recent;   Fair Remote;   Fair  Judgement:  Fair  Insight:  Shallow  Psychomotor Activity:  Restlessness  Concentration:  Fair  Recall:  [employer] of Knowledge:Fair  Language: Fair  Akathisia:  No  Handed:  Right  AIMS (if indicated):     Assets:  Desire for Improvement  ADL's:  Impaired  Cognition: WNL  Sleep:  Number of Hours: 2     Current Medications: Current Facility-Administered Medications  Medication Dose Route Frequency Provider Last Rate Last Dose  . acetaminophen (TYLENOL) tablet 650 mg  650 mg Oral Q6H PRN Laverle Hobby, PA-C   650 mg at 03/23/14 0960  . alum & mag hydroxide-simeth  (MAALOX/MYLANTA) 200-200-20 MG/5ML suspension 30 mL  30 mL Oral Q4H PRN Laverle Hobby, PA-C      . antiseptic oral rinse (BIOTENE) solution 15 mL  15 mL Mouth Rinse PRN Nicholaus Bloom, MD   15 mL at 03/26/14 [phone]  . benztropine (COGENTIN) tablet 1 mg  1 mg Oral BID PRN Nicholaus Bloom, MD      . carbamazepine (TEGRETOL XR) 12 hr tablet 200 mg  200 mg Oral BID Nicholaus Bloom, MD   200 mg at 03/26/14 0819  . clonazePAM (KLONOPIN) tablet 0.25 mg  0.25 mg Oral BID PRN Laverle Hobby, PA-C   0.25 mg at 03/26/14 1248  . clonazePAM (KLONOPIN) tablet 1 mg  1 mg Oral QHS Laverle Hobby, PA-C   1 mg at 03/25/14 2204  . docusate sodium (COLACE) capsule 100 mg  100 mg Oral QHS Jenne Campus, MD   100 mg at 03/25/14 2204  . escitalopram (LEXAPRO) tablet 20 mg  20 mg Oral Daily Nicholaus Bloom, MD   20 mg at 03/26/14 9811  . feeding supplement (ENSURE COMPLETE) (ENSURE COMPLETE) liquid 237 mL  237 mL Oral TID BM Nicholaus Bloom, MD   237 mL at 03/26/14 1400  . ferrous sulfate tablet 325 mg  325 mg Oral Q supper Laverle Hobby, PA-C   325 mg at 03/25/14 1712  . HYDROcodone-acetaminophen (NORCO) 10-325 MG per tablet 1 tablet  1 tablet Oral Q6H PRN Nicholaus Bloom, MD   1 tablet at 03/26/14 1307  . hydrOXYzine (ATARAX/VISTARIL) tablet 25 mg  25 mg Oral Q4H PRN Laverle Hobby, PA-C   25 mg at 03/21/14 2112  . ibuprofen (ADVIL,MOTRIN) tablet 800 mg  800 mg Oral Q8H PRN Laverle Hobby, PA-C   800 mg at 03/23/14 9147  . lamoTRIgine (LAMICTAL) tablet 50 mg  50 mg Oral Daily Laverle Hobby, PA-C   50 mg at 03/26/14 8295  . magnesium hydroxide (MILK OF MAGNESIA) suspension 30 mL  30 mL Oral Daily PRN Laverle Hobby, PA-C      . metoprolol tartrate (LOPRESSOR) tablet 25 mg  25 mg Oral BID Laverle Hobby, PA-C   25 mg at 03/26/14 6213  . metroNIDAZOLE (FLAGYL) tablet 500 mg  500 mg Oral Q12H Encarnacion Slates, NP   500 mg at 03/26/14 0865  . morphine 4 MG/ML injection 4 mg  4 mg Intravenous Once Harvie Heck, PA-C   4 mg at  03/23/14 2244  . pantoprazole (PROTONIX) EC tablet 40 mg  40 mg Oral Daily Laverle Hobby, PA-C   40 mg at 03/26/14 7846  . prazosin (MINIPRESS) capsule 1 mg  1 mg Oral QHS Nicholaus Bloom, MD   1 mg at 03/25/14 2204  .  risperiDONE (RISPERDAL M-TABS) disintegrating tablet 2.5 mg  2.5 mg Oral QHS Nicholaus Bloom, MD      . risperiDONE (RISPERDAL) tablet 0.5 mg  0.5 mg Oral q morning - 10a Jenne Campus, MD   0.5 mg at 03/26/14 0948  . tuberculin injection 5 Units  5 Units Intradermal Once Nicholaus Bloom, MD   5 Units at 03/26/14 1245    Lab Results: No results found for this or any previous visit (from the past 48 hour(s)).  Physical Findings: AIMS: Facial and Oral Movements Muscles of Facial Expression: None, normal Lips and Perioral Area: None, normal Jaw: None, normal Tongue: None, normal,Extremity Movements Upper (arms, wrists, hands, fingers): None, normal Lower (legs, knees, ankles, toes): None, normal, Trunk Movements Neck, shoulders, hips: None, normal, Overall Severity Severity of abnormal movements (highest score from questions above): None, normal Incapacitation due to abnormal movements: None, normal Patient's awareness of abnormal movements (rate only patient's report): No Awareness, Dental Status Current problems with teeth and/or dentures?: No Does patient usually wear dentures?: No  CIWA:  CIWA-Ar Total: 4 COWS:  COWS Total Score: 3  Treatment Plan Summary: Daily contact with patient to assess and evaluate symptoms and progress in treatment and Medication management Supportive approach/coping skills Shoulder pain S/P trauma; continue to follow up on PT/OT orthopedist recommendations Insomnia: will increase the Risperdal M tab to 2.5 mg HS with further possible increase to 3 mg ( EKG still WNL) Will resume the 1:1 OBS Work with guardian on placement   Medical Decision Making:  Review of Psycho-Social Stressors (1), Review or order medicine tests (1), Review of  Medication Regimen & Side Effects (2) and Review of New Medication or Change in Dosage (2)     Jibri Schriefer A 03/26/2014, 5:08 PM

## 2014-03-26 NOTE — BHH Group Notes (Signed)
[facility] LCSW Aftercare Discharge Planning Group Note   03/26/2014 10:01 AM  Participation Quality:  Invited. Did not attend.    Hyatt,Candace

## 2014-03-26 NOTE — Progress Notes (Signed)
1:1 note- Pt has been up for about 30 minutes after sleeping about 2 hrs.  Pt's BP is a a little low and her pulse is high which may be d/t her refusal to drink enough fluids.  Pt has been encouraged to drink fluids, but she claims she can't drink or eat because it makes her gag.  Pt did eat about a 1/3 of a small yogurt this morning.  Her gait is unsteady and she says her knee hurts, but there is no discoloration nor swelling to her knees.  Pt continues to receive pain meds for her R shoulder as needed.  Continue 1:1 for safety.  Sitter with pt.  Pt remains safe.

## 2014-03-27 ENCOUNTER — Other Ambulatory Visit: Payer: Self-pay

## 2014-03-27 MED ORDER — RISPERIDONE 1 MG PO TBDP
3.0000 mg | ORAL_TABLET | Freq: Every day | ORAL | Status: DC
  Administered 2014-03-27: 3 mg via ORAL
  Filled 2014-03-27 (×5): qty 1

## 2014-03-27 NOTE — Progress Notes (Signed)
1:1 Nursing Note. Patient in bed all evening. Her HS blood pressure was low (100/48). Writer notified the PA because patient was suppose to be taking Prazosin, Klonopin, Risperdal and she requested for Norco for pain and has been asking for it for about 2 hours. Writer already gave patient Ibuprofen because she already received Norco earlier and the medication is too early to be repeated. [name] Polio, PA advised that the writer help the Klonopin and the Minipress. But if patient angry or getting agitated over holding the medications writer should give one after that other.  Writer explained to patient. Patient seemed to understand but asked writer to wake her up later and give her the minipress because she doesn't want to have nightmare. 1:1 observations continues to maintain safety.

## 2014-03-27 NOTE — BHH Group Notes (Signed)
[facility] Group Notes: (Clinical Social Work)   03/27/2014      Type of Therapy:  Group Therapy   Participation Level:  Did Not Attend despite MHT prompting   Selmer Dominion, LCSW 03/27/2014, 12:47 PM

## 2014-03-27 NOTE — Progress Notes (Signed)
D) Pt has remained in her room thus far this shift.Presently is sleeping with even unlabored breathing. A) Given support and reassurance along with praise. Remains on the 1:1 for her safety. R) Remains safe and on the 1:1.

## 2014-03-27 NOTE — Progress Notes (Addendum)
[name] came to inform that pt fell when she turned around to flush the commode.  She lost her balance and fell, hitting her lower back and bumping her head.  Prior to this time, pt was in the bed, but she had not yet gone to sleep.  There is no noticeable bump to her head.  No redness to her lower back.  Pt is not complaining of any pain to these areas.  Vital signs were checked and were WNL.  Pt was assisted back to bed.  She was reminded to change positions slowly and ask for assistance if she felt dizzy.  Continue monitoring 1:1 for safety.  The sitter was reminded to stay within arms reach of the patient at all times.  Pt was assisted back to bed and given a heat pack for her back as she had just received Norco for her R shoulder pain a short time prior to the incident.   Pt safe in bed at this time.

## 2014-03-27 NOTE — Progress Notes (Signed)
Pt lying in bed awake waiting for her pain med which can be given at this time.  Pt voices not other complaints other than her shoulder pain.  Pt takes med with applesauce.  Pt is going to the bathroom after med is given.  Continue 1:1 for safety.  Sitter at bedside.  Pt safe at this time.

## 2014-03-27 NOTE — Progress Notes (Signed)
1:1 note- Pt continues to c/o pain to her back and R shoulder rating both 10/10.  Pt has become more agitated as the shift has progressed as she is angry with the sitter and the [name] because the sitter will not engage in conversation with her.  Pt claims the [name] told her she need to go to [place] and she says it the MHT comes near her, she will spit on her.  Pt is ranting that staff should not talk to her like that and that she knows how she needs to be treated.  Writer spent extra time talking with pt, trying to calm her down.  Pt settled down a little, saying she was not angry with Probation officer, but she was still upset.  Pt requested another heat pack which was given.  Writer encouraged pt to try to eat today,  Pt continues on 1:1 for her behavior.  Sitter at bedside.  Pt safe at this time.

## 2014-03-27 NOTE — Progress Notes (Signed)
1:1 Note  D) Pt lying in her bed. Affect is flat and mood depressed. Pt states that that she is having trouble getting up with her right shoulder pain and her back pain. While taking her morning medications Pt was able to sit up, without help. Lie down while swallowing each pill and then sit straight up without showing any signs of pain. Complains of aching bach and right shoulder pain. Did get up and take a shower this morning after her morning medications. Denies SI and HI. Rates her depression at a 2, hopelessness at a 1 and her anxiety at an 8.  A)  Given support and reassurance. All medications brought to Pt with apple sauce so that she can swallow her meds. Provided with a 1:1 with this Probation officer as well as her constant 1:1 sitter. R) Pt remains in the bed, requesting that all food and medications be brought to her.

## 2014-03-27 NOTE — Progress Notes (Signed)
Pt still c/o pain to her back so she was given Ibuprofen 800 mg prn for pain rated 10/10.  Pt still taking her meds with applesauce.  Even with the stated pain rating, pt "throws" herself backwards when she took her meds at this time.  Pt was not seen to wince or make any gesture that would suggest that her pain was a 10/10.  Pt is using heat packs to her back also.  Pt continues on 1:1 for her impulsive behaviors.

## 2014-03-27 NOTE — Progress Notes (Signed)
[facility] MD Progress Note  03/27/2014 3:36 PM [name]  MRN:  [id number]   Subjective: Patient states that she is still experiencing previously noted right shoulder pain (4/10) but that it is somewhat improving daily. Patient reports that her appetite is improving but that she is still having trouble sleeping at night. The patient states that her medications are "working well for her" but is requesting something to help her sleep better. The patient reports that she is "happy here" but that she is ready to be discharged and work on "getting her guardianship back" so that she can marry her current boyfriend.  Objective: Patient has a depressed mood and a flat affect. The patient denies SI/HI and auditory and visual hallucinations. The patient reports that she last heard auditory hallucinations "about three days ago" and that "they are gone now." According to nursing staff, the patient is isolating to her room and not attending groups. Staff is encouraging patient to increase her activity (with assistancel; currently on 1:1) and participate within the milieu more.  Principal Problem: Schizoaffective disorder, bipolar type Diagnosis:   Patient Active Problem List   Diagnosis Date Noted  . Prolonged QT syndrome [I45.81]   . Schizoaffective disorder, unspecified type [F25.9]   . EKG abnormality [R94.31] 03/12/2014  . Schizoaffective disorder, bipolar type [F25.0] 03/10/2014  . Borderline personality disorder [F60.3] 03/10/2014  . PTSD (post-traumatic stress disorder) [F43.10] 03/10/2014  . Asperger's syndrome [F84.5] 03/10/2014   Total Time spent with patient: 30 minutes   Past Medical History:  Past Medical History  Diagnosis Date  . Asthma   . Depression   . Schizoaffective disorder, bipolar type   . Autism   . Essential tremor   . Chronic constipation   . Acid reflux     Past Surgical History  Procedure Laterality Date  . Mouth surgery     Family History:  Family History   Problem Relation Age of Onset  . Mental illness Father   . PDD Brother   . Seizures Brother    Social History:  History  Alcohol Use No     History  Drug Use No    History   Social History  . Marital Status: Single    Spouse Name: N/A  . Number of Children: N/A  . Years of Education: N/A   Social History Main Topics  . Smoking status: Never Smoker   . Smokeless tobacco: Not on file  . Alcohol Use: No  . Drug Use: No  . Sexual Activity: Yes   Other Topics Concern  . None   Social History Narrative   Additional History: Patient reports engagement to current boyfriend and states that she wants to "get her guardianship back" in order to marry him when she is discharged.  Sleep: Poor  Appetite:  Fair   Assessment:   Musculoskeletal: Strength & Muscle Tone: decreased Gait & Station: normal Patient leans: N/A   Psychiatric Specialty Exam: Physical Exam  ROS  Blood pressure 110/52, pulse 68, temperature 97.8 F (36.6 C), temperature source Oral, resp. rate 20, height 5\' 5"  (1.651 m), weight 83.9 kg (184 lb 15.5 oz), last menstrual period 03/17/2014, SpO2 98 %.Body mass index is 30.78 kg/(m^2).  General Appearance: Fairly Groomed  Engineer, water::  Fair  Speech:  Clear and Coherent  Volume:  fluctuates  Mood:  Depressed  Affect:  Restricted  Thought Process:  Coherent and Goal Directed  Orientation:  Full (Time, Place, and Person)  Thought Content:  symptoms  worries and concerns  Suicidal Thoughts:  No  Homicidal Thoughts:  No  Memory:  Immediate;   Fair Recent;   Fair Remote;   Fair  Judgement:  Fair  Insight:  Shallow  Psychomotor Activity:  Restlessness  Concentration:  Fair  Recall:  [employer] of Knowledge:Fair  Language: Fair  Akathisia:  No  Handed:  Right  AIMS (if indicated):     Assets:  Desire for Improvement  ADL's:  Impaired  Cognition: WNL  Sleep:  Number of Hours: 2     Current Medications: Current Facility-Administered  Medications  Medication Dose Route Frequency Provider Last Rate Last Dose  . acetaminophen (TYLENOL) tablet 650 mg  650 mg Oral Q6H PRN Laverle Hobby, PA-C   650 mg at 03/23/14 4825  . alum & mag hydroxide-simeth (MAALOX/MYLANTA) 200-200-20 MG/5ML suspension 30 mL  30 mL Oral Q4H PRN Laverle Hobby, PA-C      . antiseptic oral rinse (BIOTENE) solution 15 mL  15 mL Mouth Rinse PRN Nicholaus Bloom, MD   15 mL at 03/26/14 [phone]  . benztropine (COGENTIN) tablet 1 mg  1 mg Oral BID PRN Nicholaus Bloom, MD      . carbamazepine (TEGRETOL XR) 12 hr tablet 200 mg  200 mg Oral BID Nicholaus Bloom, MD   200 mg at 03/27/14 0909  . clonazePAM (KLONOPIN) tablet 0.25 mg  0.25 mg Oral BID PRN Laverle Hobby, PA-C   0.25 mg at 03/26/14 1248  . clonazePAM (KLONOPIN) tablet 1 mg  1 mg Oral QHS Laverle Hobby, PA-C   1 mg at 03/26/14 2123  . docusate sodium (COLACE) capsule 100 mg  100 mg Oral QHS Jenne Campus, MD   100 mg at 03/26/14 2122  . escitalopram (LEXAPRO) tablet 20 mg  20 mg Oral Daily Nicholaus Bloom, MD   20 mg at 03/27/14 0909  . feeding supplement (ENSURE COMPLETE) (ENSURE COMPLETE) liquid 237 mL  237 mL Oral TID BM Nicholaus Bloom, MD   237 mL at 03/26/14 1400  . ferrous sulfate tablet 325 mg  325 mg Oral Q supper Laverle Hobby, PA-C   325 mg at 03/26/14 1722  . HYDROcodone-acetaminophen (NORCO) 10-325 MG per tablet 1 tablet  1 tablet Oral Q6H PRN Nicholaus Bloom, MD   1 tablet at 03/27/14 0907  . hydrOXYzine (ATARAX/VISTARIL) tablet 25 mg  25 mg Oral Q4H PRN Laverle Hobby, PA-C   25 mg at 03/27/14 0909  . ibuprofen (ADVIL,MOTRIN) tablet 800 mg  800 mg Oral Q8H PRN Laverle Hobby, PA-C   800 mg at 03/27/14 1212  . lamoTRIgine (LAMICTAL) tablet 50 mg  50 mg Oral Daily Laverle Hobby, PA-C   50 mg at 03/27/14 0908  . magnesium hydroxide (MILK OF MAGNESIA) suspension 30 mL  30 mL Oral Daily PRN Laverle Hobby, PA-C      . metoprolol tartrate (LOPRESSOR) tablet 25 mg  25 mg Oral BID Laverle Hobby, PA-C    25 mg at 03/27/14 0912  . metroNIDAZOLE (FLAGYL) tablet 500 mg  500 mg Oral Q12H Encarnacion Slates, NP   500 mg at 03/27/14 0910  . morphine 4 MG/ML injection 4 mg  4 mg Intravenous Once Harvie Heck, PA-C   4 mg at 03/23/14 2244  . pantoprazole (PROTONIX) EC tablet 40 mg  40 mg Oral Daily Laverle Hobby, PA-C   40 mg at 03/27/14 0488  . prazosin (  MINIPRESS) capsule 1 mg  1 mg Oral QHS Nicholaus Bloom, MD   1 mg at 03/26/14 2122  . risperiDONE (RISPERDAL M-TABS) disintegrating tablet 2.5 mg  2.5 mg Oral QHS Nicholaus Bloom, MD   2.5 mg at 03/26/14 2122  . risperiDONE (RISPERDAL) tablet 0.5 mg  0.5 mg Oral q morning - 10a Myer Peer Cobos, MD   0.5 mg at 03/27/14 1211  . tuberculin injection 5 Units  5 Units Intradermal Once Nicholaus Bloom, MD   5 Units at 03/26/14 1245    Lab Results: No results found for this or any previous visit (from the past 48 hour(s)).  Physical Findings: AIMS: Facial and Oral Movements Muscles of Facial Expression: None, normal Lips and Perioral Area: None, normal Jaw: None, normal Tongue: None, normal,Extremity Movements Upper (arms, wrists, hands, fingers): None, normal Lower (legs, knees, ankles, toes): None, normal, Trunk Movements Neck, shoulders, hips: None, normal, Overall Severity Severity of abnormal movements (highest score from questions above): None, normal Incapacitation due to abnormal movements: None, normal Patient's awareness of abnormal movements (rate only patient's report): No Awareness, Dental Status Current problems with teeth and/or dentures?: No Does patient usually wear dentures?: No  CIWA:  CIWA-Ar Total: 4 COWS:  COWS Total Score: 3  Treatment Plan Summary: Daily contact with patient to assess and evaluate symptoms and progress in treatment and Medication management Supportive approach/coping skills Shoulder pain S/P trauma; continue to follow up on PT/OT orthopedist recommendations Continue 1:1 Observation Work with guardian on  placement   Medical Decision Making:  Review of Psycho-Social Stressors (1), Review or order medicine tests (1), Review of Medication Regimen & Side Effects (2) and Review of New Medication or Change in Dosage (2)   Milta Deiters T. Catricia Scheerer RPAC 3:05 PM 02/202016

## 2014-03-27 NOTE — BHH Group Notes (Signed)
[facility] Group Notes:  (Nursing/MHT/Case Management/Adjunct)  Date:  03/27/2014  Time:  2:10 PM  Type of Therapy:  Psychoeducational Skills  Participation Level:  Did Not Attend  Participation Quality:  na  Affect:  na  Cognitive:  na  Insight:  None  Engagement in Group:  na  Modes of Intervention:  na  Summary of Progress/Problems:  [name] 03/27/2014, 2:10 PM

## 2014-03-27 NOTE — Progress Notes (Signed)
D) Pt became upset with this Probation officer and began to spit and to tell this Probation officer "I want a razor blade to cut my wrist and my mother can have me cremated and spread my ashes wherever she wants to". Screaming and yelling S"shut the f----up b----". Also stating "I have been through it all and I can't do this anymore. I want to die".  Crying loudly without tears. Stating I will sue this place if you put your hands on me. You can't do anyithing because I am already hurt. A) Attempts made at calming Pt down to no avail. Unable or unwilling to be comforted.  R) Pt continues to demand a razor blade to cut her wrists.

## 2014-03-28 NOTE — Progress Notes (Signed)
1:1 Nursing note: Patient woke up and requested for more ice. Complaint of pain and requested for more pain medication. She rated her pain at 10 on the scale of 1-10 with 10 the worst. Other than that, patient had no complaint and seemed to have a resting night. 1:1 observations continues to maintain safety.

## 2014-03-28 NOTE — Progress Notes (Signed)
Did not attend group 
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
## 2014-03-28 NOTE — Progress Notes (Signed)
D) Pt is sleeping with even unlabored breathing. A) Frequent contact with Pt. R) Pt remains on a 1:1. For her safety and the safety of others.
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
## 2014-03-28 NOTE — Progress Notes (Signed)
1:1 Nursing Note @ 0630 am:  Patient resting quietly with eyes closed. Respirations even and unlabored. No distress noted. 1:1 oservations continues as ordered to maintain safety.

## 2014-03-28 NOTE — Progress Notes (Addendum)
D) Pt was given her medications this morning and is pleasant. Asked to use the wheelchair to go to the tub room to take a bath and wash her hair. Also requested to use the wheelchair while speaking on the phone due to the length of the cord and Pt feels she is unable to stand. Affect is flat with some brightness on approach. States that she would like to attend the some of the groups today. A) Pt given support and reassurance. Allowed to use the wheelchair to go to group and to talk on the phone. Pt remains a fall risk and requires a 1:1 due to her injuries, fall history and behaviors. R) Pt remains on a 1:1 for her safety and the safety of others.

## 2014-03-28 NOTE — BHH Group Notes (Signed)
[facility] Group Notes:  (Clinical Social Work)  03/28/2014   11:15am-12:00pm  Summary of Progress/Problems:  The main focus of today's process group was to listen to a variety of genres of music and to identify that different types of music provoke different responses.  The patient then was able to identify personally what was soothing for them, as well as energizing.  Handouts were used to record feelings evoked, as well as how patient can personally use this knowledge in sleep habits, with depression, and with other symptoms.  The patient expressed understanding of concepts, as well as knowledge of how each type of music affected him/her and how this can be used at home as a wellness/recovery tool.  She was interactive and pleasant.  Type of Therapy:  Music Therapy   Participation Level:  Active  Participation Quality:  Attentive  Affect:  Flat   Cognitive:  Oriented  Insight:  Engaged  Engagement in Therapy:  Engaged  Modes of Intervention:   Activity, Exploration  Selmer Dominion, LCSW 03/28/2014, 12:30pm

## 2014-03-29 ENCOUNTER — Other Ambulatory Visit: Payer: Self-pay

## 2014-03-29 ENCOUNTER — Other Ambulatory Visit (HOSPITAL_COMMUNITY): Payer: Self-pay

## 2014-03-29 ENCOUNTER — Inpatient Hospital Stay (HOSPITAL_COMMUNITY): Payer: Medicare Other

## 2014-03-29 DIAGNOSIS — F602 Antisocial personality disorder: Secondary | ICD-10-CM | POA: Diagnosis not present

## 2014-03-29 DIAGNOSIS — M79604 Pain in right leg: Secondary | ICD-10-CM | POA: Diagnosis not present

## 2014-03-29 DIAGNOSIS — Z639 Problem related to primary support group, unspecified: Secondary | ICD-10-CM | POA: Diagnosis not present

## 2014-03-29 DIAGNOSIS — E559 Vitamin D deficiency, unspecified: Secondary | ICD-10-CM | POA: Diagnosis not present

## 2014-03-29 DIAGNOSIS — F840 Autistic disorder: Secondary | ICD-10-CM | POA: Diagnosis not present

## 2014-03-29 DIAGNOSIS — F603 Borderline personality disorder: Secondary | ICD-10-CM | POA: Diagnosis not present

## 2014-03-29 DIAGNOSIS — Z609 Problem related to social environment, unspecified: Secondary | ICD-10-CM | POA: Diagnosis not present

## 2014-03-29 DIAGNOSIS — R112 Nausea with vomiting, unspecified: Secondary | ICD-10-CM | POA: Diagnosis not present

## 2014-03-29 DIAGNOSIS — F250 Schizoaffective disorder, bipolar type: Secondary | ICD-10-CM | POA: Diagnosis not present

## 2014-03-29 DIAGNOSIS — Z659 Problem related to unspecified psychosocial circumstances: Secondary | ICD-10-CM | POA: Diagnosis not present

## 2014-03-29 DIAGNOSIS — Z591 Inadequate housing: Secondary | ICD-10-CM | POA: Diagnosis not present

## 2014-03-29 LAB — CBC WITH DIFFERENTIAL/PLATELET
Basophils Absolute: 0 10*3/uL (ref 0.0–0.1)
Basophils Relative: 0 % (ref 0–1)
EOS ABS: 0 10*3/uL (ref 0.0–0.7)
Eosinophils Relative: 0 % (ref 0–5)
HCT: 40.7 % (ref 36.0–46.0)
HEMOGLOBIN: 13.4 g/dL (ref 12.0–15.0)
Lymphocytes Relative: 21 % (ref 12–46)
Lymphs Abs: 2.2 10*3/uL (ref 0.7–4.0)
MCH: 27.6 pg (ref 26.0–34.0)
MCHC: 32.9 g/dL (ref 30.0–36.0)
MCV: 83.7 fL (ref 78.0–100.0)
MONOS PCT: 6 % (ref 3–12)
Monocytes Absolute: 0.6 10*3/uL (ref 0.1–1.0)
NEUTROS ABS: 7.4 10*3/uL (ref 1.7–7.7)
Neutrophils Relative %: 73 % (ref 43–77)
PLATELETS: 295 10*3/uL (ref 150–400)
RBC: 4.86 MIL/uL (ref 3.87–5.11)
RDW: 13.5 % (ref 11.5–15.5)
WBC: 10.2 10*3/uL (ref 4.0–10.5)

## 2014-03-29 LAB — COMPREHENSIVE METABOLIC PANEL
ALBUMIN: 5 g/dL (ref 3.5–5.2)
ALT: 22 U/L (ref 0–35)
AST: 24 U/L (ref 0–37)
Alkaline Phosphatase: 91 U/L (ref 39–117)
Anion gap: 14 (ref 5–15)
BUN: 10 mg/dL (ref 6–23)
CHLORIDE: 103 mmol/L (ref 96–112)
CO2: 19 mmol/L (ref 19–32)
CREATININE: 0.65 mg/dL (ref 0.50–1.10)
Calcium: 9.8 mg/dL (ref 8.4–10.5)
GFR calc Af Amer: >90 mL/min (ref >90)
GFR calc non Af Amer: >90 mL/min (ref >90)
GLUCOSE: 85 mg/dL (ref 70–99)
POTASSIUM: 3.9 mmol/L (ref 3.5–5.1)
Sodium: 136 mmol/L (ref 135–145)
Total Bilirubin: 0.8 mg/dL (ref 0.3–1.2)
Total Protein: 8.4 g/dL — ABNORMAL HIGH (ref 6.0–8.3)

## 2014-03-29 LAB — URINALYSIS, ROUTINE W REFLEX MICROSCOPIC
Glucose, UA: NEGATIVE mg/dL
HGB URINE DIPSTICK: NEGATIVE
Ketones, ur: >80 mg/dL — AB
Leukocytes, UA: NEGATIVE
NITRITE: NEGATIVE
PROTEIN: NEGATIVE mg/dL
Specific Gravity, Urine: 1.023 (ref 1.005–1.030)
Urobilinogen, UA: 0.2 mg/dL (ref 0.0–1.0)
pH: 5.5 (ref 5.0–8.0)

## 2014-03-29 LAB — LIPASE, BLOOD: Lipase: 23 U/L (ref 11–59)

## 2014-03-29 MED ORDER — RISPERIDONE 0.5 MG PO TABS: 0.5000 mg | ORAL_TABLET | Freq: Every morning | ORAL | Status: DC

## 2014-03-29 MED ORDER — SODIUM CHLORIDE 0.9 % IV SOLN
1000.0000 mL | Freq: Once | INTRAVENOUS | Status: AC
  Administered 2014-03-29: 1000 mL via INTRAVENOUS

## 2014-03-29 MED ORDER — CLONAZEPAM 1 MG PO TABS: 1.0000 mg | ORAL_TABLET | Freq: Every day | ORAL | Status: DC

## 2014-03-29 MED ORDER — FERROUS SULFATE 325 (65 FE) MG PO TABS: 325.0000 mg | ORAL_TABLET | Freq: Every day | ORAL | Status: DC

## 2014-03-29 MED ORDER — CARBAMAZEPINE ER 200 MG PO TB12: 200.0000 mg | ORAL_TABLET | Freq: Two times a day (BID) | ORAL | Status: DC

## 2014-03-29 MED ORDER — ONDANSETRON 4 MG PO TBDP: 4.0000 mg | ORAL_TABLET | Freq: Three times a day (TID) | ORAL | Status: DC | PRN

## 2014-03-29 MED ORDER — METOPROLOL TARTRATE 25 MG PO TABS: 25.0000 mg | ORAL_TABLET | Freq: Two times a day (BID) | ORAL | Status: DC

## 2014-03-29 MED ORDER — CLONAZEPAM 0.5 MG PO TABS: 0.2500 mg | ORAL_TABLET | Freq: Two times a day (BID) | ORAL | Status: DC | PRN

## 2014-03-29 MED ORDER — SODIUM CHLORIDE 0.9 % IV SOLN
1000.0000 mL | INTRAVENOUS | Status: DC
  Administered 2014-03-29: 1000 mL via INTRAVENOUS
  Filled 2014-03-29: qty 1000

## 2014-03-29 MED ORDER — ESCITALOPRAM OXALATE 20 MG PO TABS: 20.0000 mg | ORAL_TABLET | Freq: Every day | ORAL | Status: DC

## 2014-03-29 MED ORDER — ONDANSETRON HCL 4 MG/2ML IJ SOLN
4.0000 mg | Freq: Once | INTRAMUSCULAR | Status: AC
  Administered 2014-03-29: 4 mg via INTRAVENOUS
  Filled 2014-03-29: qty 2

## 2014-03-29 MED ORDER — HYDROMORPHONE HCL 1 MG/ML IJ SOLN
1.0000 mg | Freq: Once | INTRAMUSCULAR | Status: AC
  Administered 2014-03-29: 1 mg via INTRAVENOUS
  Filled 2014-03-29: qty 1

## 2014-03-29 MED ORDER — PRAZOSIN HCL 1 MG PO CAPS: 1.0000 mg | ORAL_CAPSULE | Freq: Every day | ORAL | Status: DC

## 2014-03-29 MED ORDER — DIPHENHYDRAMINE HCL 50 MG/ML IJ SOLN
50.0000 mg | Freq: Once | INTRAMUSCULAR | Status: AC
  Administered 2014-03-29: 50 mg via INTRAMUSCULAR
  Filled 2014-03-29 (×2): qty 1

## 2014-03-29 MED ORDER — PANTOPRAZOLE SODIUM 40 MG PO TBEC: 40.0000 mg | DELAYED_RELEASE_TABLET | Freq: Every day | ORAL | Status: DC

## 2014-03-29 MED ORDER — RISPERIDONE 3 MG PO TBDP: 3.0000 mg | ORAL_TABLET | Freq: Every day | ORAL | Status: DC

## 2014-03-29 MED ORDER — DOCUSATE SODIUM 100 MG PO CAPS: 100.0000 mg | ORAL_CAPSULE | Freq: Every day | ORAL | Status: DC

## 2014-03-29 MED ORDER — DIAZEPAM 5 MG/ML IJ SOLN: 5.0000 mg | Freq: Once | INTRAMUSCULAR | Status: DC

## 2014-03-29 MED ORDER — DIAZEPAM 5 MG PO TABS
ORAL_TABLET | ORAL | Status: AC
  Administered 2014-03-29: 5 mg via ORAL
  Filled 2014-03-29: qty 1

## 2014-03-29 MED ORDER — LAMOTRIGINE 25 MG PO TABS: 50.0000 mg | ORAL_TABLET | Freq: Every day | ORAL | Status: DC

## 2014-03-29 MED ORDER — BIOTENE DRY MOUTH MT LIQD: 15.0000 mL | OROMUCOSAL | Status: DC | PRN

## 2014-03-29 MED ORDER — BENZTROPINE MESYLATE 1 MG PO TABS: 1.0000 mg | ORAL_TABLET | Freq: Two times a day (BID) | ORAL | Status: DC | PRN

## 2014-03-29 MED ORDER — DIAZEPAM 5 MG PO TABS
5.0000 mg | ORAL_TABLET | Freq: Once | ORAL | Status: AC
  Administered 2014-03-29: 5 mg via ORAL

## 2014-03-29 NOTE — Discharge Instructions (Signed)
Call for a follow up appointment with a Family or Primary Care Provider in 6-12 months to evaluate the incidental nodule (0.6 cm nodule) in your lung. Return if Symptoms worsen.   Take medication as prescribed.     Abdominal Pain Many things can cause abdominal pain. Usually, abdominal pain is not caused by a disease and will improve without treatment. It can often be observed and treated at home. Your health care provider will do a physical exam and possibly order blood tests and X-rays to help determine the seriousness of your pain. However, in many cases, more time must pass before a clear cause of the pain can be found. Before that point, your health care provider may not know if you need more testing or further treatment. HOME CARE INSTRUCTIONS  Monitor your abdominal pain for any changes. The following actions may help to alleviate any discomfort you are experiencing:  Only take over-the-counter or prescription medicines as directed by your health care provider.  Do not take laxatives unless directed to do so by your health care provider.  Try a clear liquid diet (broth, tea, or water) as directed by your health care provider. Slowly move to a bland diet as tolerated. SEEK MEDICAL CARE IF:  You have unexplained abdominal pain.  You have abdominal pain associated with nausea or diarrhea.  You have pain when you urinate or have a bowel movement.  You experience abdominal pain that wakes you in the night.  You have abdominal pain that is worsened or improved by eating food.  You have abdominal pain that is worsened with eating fatty foods.  You have a fever. SEEK IMMEDIATE MEDICAL CARE IF:   Your pain does not go away within 2 hours.  You keep throwing up (vomiting).  Your pain is felt only in portions of the abdomen, such as the right side or the left lower portion of the abdomen.  You pass bloody or black tarry stools. MAKE SURE YOU:  Understand these instructions.    Will watch your condition.   Will get help right away if you are not doing well or get worse.  Document Released: 11/01/2004 Document Revised: 01/27/2013 Document Reviewed: 10/01/2012 [facility] Patient Information 2015 [place], [place]. This information is not intended to replace advice given to you by your health care provider. Make sure you discuss any questions you have with your health care provider.

## 2014-03-29 NOTE — Clinical Social Work Note (Signed)
Patient has been accepted to [facility]. Transport by [employer] should arrive around 8-9pm [employer]. [place] [phone]), Opal Sidles at [facility] has been notified of an ETA of around 11 pm. RN and [facility] updated. IVC paperwork placed on patient's chart.  Tilden Fossa, MSW, [place] Worker [facility] [phone]
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
## 2014-03-29 NOTE — Progress Notes (Signed)
Patient ID: [name], female   DOB: [birth year], 24 y.o.   MRN: [id number] Pt was resistant to receiving discharge education. " I'm not stupid". Pt declined writer's review of her medications. " I know all of that". Pt was encouraged to contact the help-line for any thoughts of wanting to harm herself. " I know that's why I called my fiance". Pt denied any SI/HI. Pt states, "I'm not crazy", when writer asked about the presence of any AVH. Pt was encouraged several times by 1:1 staff member to use the bathroom prior to leaving. Pt reported the need to go once she was getting into the van in transport to [facility]. Pt previously reported that she was unable to go. Pt was hesitant to leave [facility]. Pt was provided with incontinent pad in the event of any emergent need to use the bathroom.
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
## 2014-03-29 NOTE — ED Notes (Addendum)
Pt transferred back to [facility] with sitter by [place]. Pt NAD.

## 2014-03-29 NOTE — ED Provider Notes (Signed)
CSN: [id number]     Arrival date & time 03/23/14  1943 History   First MD Initiated Contact with Patient 03/23/14 1949     Chief Complaint  Patient presents with  . Abdominal Pain  . Nausea    HPI The patient is currently an inpatient at [facility] behavioral health.   She was sent to the emergency room because of complaints of pain in her abdomen.   Pt states she has been having pain for the last 3 weeks in her abdomen.    She was recently sent to the emergency department about a week ago and had an evaluation that included an abdominal pelvic CT. The results were unremarkable.  Over the last 3-4 days her symptoms are getting worse.  The pain is in the left upper mid and upper abdomen.  Not in the lower abdomen.  It is a sharp pain.  She gets dizzy and lightheaded with it.  She has not been able to eat or drink properly because of the pain.  She has been vomiting.  No diarrhea or constipation. No dysuria.  Patient is sure that there is something wrong. She wants everything tested. Past Medical History  Diagnosis Date  . Asthma   . Depression   . Schizoaffective disorder, bipolar type   . Autism   . Essential tremor   . Chronic constipation   . Acid reflux    Past Surgical History  Procedure Laterality Date  . Mouth surgery     Family History  Problem Relation Age of Onset  . Mental illness Father   . PDD Brother   . Seizures Brother    History  Substance Use Topics  . Smoking status: Never Smoker   . Smokeless tobacco: Not on file  . Alcohol Use: No   OB History    No data available     Review of Systems  All other systems reviewed and are negative.     Allergies  Coconut flavor; Geodon; Haloperidol and related; Lithium; Oxycodone; Prilosec; and Seroquel  Home Medications   Prior to Admission medications   Medication Sig Start Date End Date Taking? Authorizing Provider  carbamazepine (TEGRETOL XR) 100 MG 12 hr tablet Take 100 mg by mouth 2 (two) times daily.    Yes Historical Provider, MD  clonazePAM (KLONOPIN) 0.5 MG tablet Take 0.25 mg by mouth 2 (two) times daily as needed for anxiety.   Yes Historical Provider, MD  clonazePAM (KLONOPIN) 1 MG tablet Take 1 mg by mouth at bedtime.   Yes Historical Provider, MD  docusate sodium (COLACE) 100 MG capsule Take 100 mg by mouth 2 (two) times daily.   Yes Historical Provider, MD  esomeprazole (NEXIUM) 40 MG capsule Take 40 mg by mouth daily at 12 noon.   Yes Historical Provider, MD  FLUoxetine (PROZAC) 40 MG capsule Take 1 capsule (40 mg total) by mouth every morning. 01/09/14  Yes Benjamine Mola, FNP  hydrOXYzine (ATARAX/VISTARIL) 25 MG tablet Take 25 mg by mouth every 4 (four) hours as needed for anxiety.   Yes Historical Provider, MD  ibuprofen (ADVIL,MOTRIN) 800 MG tablet Take 800 mg by mouth every 8 (eight) hours as needed for moderate pain.   Yes Historical Provider, MD  LamoTRIgine 50 MG TBDP Take 1 tablet by mouth daily.   Yes Historical Provider, MD  ranitidine (ZANTAC) 150 MG tablet Take 150 mg by mouth 2 (two) times daily.   Yes Historical Provider, MD  antiseptic oral rinse (BIOTENE)  LIQD 15 mLs by Mouth Rinse route as needed for dry mouth. 03/29/14   Elmarie Shiley, NP  benztropine (COGENTIN) 1 MG tablet Take 1 tablet (1 mg total) by mouth 2 (two) times daily as needed for tremors. 03/29/14   Elmarie Shiley, NP  carbamazepine (TEGRETOL XR) 200 MG 12 hr tablet Take 1 tablet (200 mg total) by mouth 2 (two) times daily. 03/29/14   Elmarie Shiley, NP  clonazePAM (KLONOPIN) 0.5 MG tablet Take 0.5 tablets (0.25 mg total) by mouth 2 (two) times daily as needed (agitation). 03/29/14   Elmarie Shiley, NP  clonazePAM (KLONOPIN) 1 MG tablet Take 1 tablet (1 mg total) by mouth at bedtime. 03/29/14   Elmarie Shiley, NP  docusate sodium (COLACE) 100 MG capsule Take 1 capsule (100 mg total) by mouth at bedtime. 03/29/14   Elmarie Shiley, NP  escitalopram (LEXAPRO) 20 MG tablet Take 1 tablet (20 mg total) by mouth daily. 03/29/14   Elmarie Shiley, NP  ferrous sulfate 325 (65 FE) MG tablet Take 1 tablet (325 mg total) by mouth daily with supper. 03/29/14   Elmarie Shiley, NP  lamoTRIgine (LAMICTAL) 25 MG tablet Take 2 tablets (50 mg total) by mouth daily. 03/29/14   Elmarie Shiley, NP  metoprolol tartrate (LOPRESSOR) 25 MG tablet Take 1 tablet (25 mg total) by mouth 2 (two) times daily. 03/29/14   Elmarie Shiley, NP  pantoprazole (PROTONIX) 40 MG tablet Take 1 tablet (40 mg total) by mouth daily. 03/29/14   Elmarie Shiley, NP  prazosin (MINIPRESS) 1 MG capsule Take 1 capsule (1 mg total) by mouth at bedtime. 03/29/14   Elmarie Shiley, NP  risperiDONE (RISPERDAL M-TABS) 3 MG disintegrating tablet Take 1 tablet (3 mg total) by mouth at bedtime. 03/29/14   Elmarie Shiley, NP  risperiDONE (RISPERDAL) 0.5 MG tablet Take 1 tablet (0.5 mg total) by mouth every morning. 03/29/14   Elmarie Shiley, NP  traZODone (DESYREL) 50 MG tablet Take 1 tablet (50 mg total) by mouth at bedtime as needed for sleep. Patient not taking: Reported on 03/16/2014 01/09/14   Benjamine Mola, FNP   BP 129/84 mmHg  Pulse 101  Temp(Src) 98.9 F (37.2 C) (Axillary)  Resp 19  Ht 5\' 5"  (1.651 m)  Wt 184 lb 15.5 oz (83.9 kg)  BMI 30.78 kg/m2  SpO2 99%  LMP 03/17/2014 Physical Exam  Constitutional: She appears well-developed and well-nourished. No distress.  HENT:  Head: Normocephalic and atraumatic.  Right Ear: External ear normal.  Left Ear: External ear normal.  Eyes: Conjunctivae are normal. Right eye exhibits no discharge. Left eye exhibits no discharge. No scleral icterus.  Neck: Neck supple. No tracheal deviation present.  Cardiovascular: Normal rate, regular rhythm and intact distal pulses.   Pulmonary/Chest: Effort normal and breath sounds normal. No stridor. No respiratory distress. She has no wheezes. She has no rales.  Abdominal: Soft. Bowel sounds are normal. She exhibits no distension. There is tenderness in the left upper quadrant. There is no rebound, no guarding and no CVA  tenderness. No hernia.  Musculoskeletal: She exhibits no edema or tenderness.  Neurological: She is alert. She has normal strength. No cranial nerve deficit (no facial droop, extraocular movements intact, no slurred speech) or sensory deficit. She exhibits normal muscle tone. She displays no seizure activity. Coordination normal.  Skin: Skin is warm and dry. No rash noted.  Psychiatric: She has a normal mood and affect.  Nursing note and vitals reviewed.   ED Course  Procedures (including critical  care time) Labs Review Labs Reviewed  URINALYSIS, ROUTINE W REFLEX MICROSCOPIC - Abnormal; Notable for the following:    APPearance CLOUDY (*)    All other components within normal limits  CBC WITH DIFFERENTIAL/PLATELET - Abnormal; Notable for the following:    Hemoglobin 11.9 (*)    All other components within normal limits  COMPREHENSIVE METABOLIC PANEL - Abnormal; Notable for the following:    Total Protein 8.4 (*)    All other components within normal limits  PREGNANCY, URINE  COMPREHENSIVE METABOLIC PANEL  LIPASE, BLOOD  CBC WITH DIFFERENTIAL/PLATELET  LIPASE, BLOOD  URINALYSIS, ROUTINE W REFLEX MICROSCOPIC    Imaging Review Dg Abd Acute W/chest  03/29/2014   CLINICAL DATA:  Nausea and vomiting.  EXAM: ACUTE ABDOMEN SERIES (ABDOMEN 2 VIEW & CHEST 1 VIEW)  COMPARISON:  03/23/2014  FINDINGS: Normal heart size.  Clear lungs.  No free intraperitoneal gas on the left decubitus image. No disproportionate dilatation of bowel.  No acute bony deformity.  Unremarkable soft tissues.  IMPRESSION: Negative abdominal radiographs.  No acute cardiopulmonary disease.   Electronically Signed   By: Marybelle Killings M.D.   On: 03/29/2014 14:06       MDM   Final diagnoses:  Left sided abdominal pain  Nausea  Incidental lung nodule    Patient had an incidental lung nodule and a prior CT scan. Patient's laboratory tests are unremarkable. She just had a recent abdominal pelvic CT when she was in the  emergency department last week.  I do not feel that repeat imaging is necessary at this time. There may be a psychosomatic component considering her significant mental health issues.  At this time there does not appear to be any evidence of an acute emergency medical condition and the patient appears stable for discharge with appropriate outpatient follow up.     Dorie Rank, MD 03/29/14 [phone]

## 2014-03-29 NOTE — ED Notes (Signed)
Bed: PX10 Expected date:  Expected time:  Means of arrival:  Comments: BH-abdominal pain

## 2014-03-29 NOTE — Clinical Social Work Note (Signed)
Per Opal Sidles at [facility], patient has been accepted for admission today. [facility] would like ETA when known.   Tilden Fossa, MSW, [place] Worker [facility] [phone]

## 2014-03-29 NOTE — Plan of Care (Signed)
Problem: Alteration in thought process Goal: STG-Patient is able to sleep at least 6 hours per night Outcome: Not Progressing Patient states she only slept 0.75 hours last evening which is confirmed by chart.
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
## 2014-03-29 NOTE — Progress Notes (Signed)
MD evaluated patient. Order received to transport patient to [facility] (with [facility]) as she continues to complain of severe epigastric pain, nausea and inability to drink/eat. Non-emergent EMS phoned and report called to [employer]. Patient aware of plan. Patient stable at this time. Jamie Kato

## 2014-03-29 NOTE — Progress Notes (Signed)
Patient ID: [name], female   DOB: May 30, 1990, 24 y.o.   MRN: [id number] PER STATE REGULATIONS 482.30  THIS CHART WAS REVIEWED FOR MEDICAL NECESSITY WITH RESPECT TO THE PATIENT'S ADMISSION/ DURATION OF STAY.  NEXT REVIEW DATE: 04/02/2014  Chauncy Lean, RN, BSN CASE MANAGER
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
## 2014-03-29 NOTE — ED Notes (Signed)
Patient states that she can not give sample and will notify when ready.

## 2014-03-29 NOTE — Clinical Social Work Note (Signed)
CSW left voicemail for guardian Gae Gallop [phone] to update on discharge plans. Awaiting return calls.  Tilden Fossa, MSW, [place] Worker [facility] [phone]

## 2014-03-29 NOTE — Progress Notes (Signed)
PPD read and is negative. [name]

## 2014-03-29 NOTE — Clinical Social Work Note (Signed)
IVC paperwork completed and faxed to [facility].   Tilden Fossa, MSW, [place] Worker [facility] [phone]
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
## 2014-03-29 NOTE — Clinical Social Work Note (Signed)
CSW spoke with Gae Bon at [facility] who states that they have bed available today and are requesting that Allen fax over updated clinicals and IVC paperwork. Patient updated MD. Upon reviewing IVC paperwork, patient will need to be re-petitioned at this time.   Tilden Fossa, MSW, [place] Worker [facility] [phone]

## 2014-03-29 NOTE — ED Notes (Signed)
Per [facility], patient states she is having "GI issues"-states nausea and vomiting-no vomiting witnessed other that spitting up what appears to be mucous-patient very talkative on the way over, did not appear in any distress
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
## 2014-03-30 NOTE — [facility] Suicide Risk Assessment (Signed)
[place] INPATIENT:  Family/Significant Other Suicide Prevention Education  Suicide Prevention Education:  Patient Discharged to [place]:  Suicide Prevention Education Not Provided: {PT. DISCHARGED TO OTHER HEALTHCARE FACILITY:SUICIDE PREVENTION EDUCATION NOT PROVIDED (CHL):  The patient is discharging to another healthcare facility for continuation of treatment.  The patient's medical information, including suicide ideations and risk factors, are a part of the medical information shared with the receiving healthcare facility.  Aizah Gehlhausen, Casimiro Needle 03/30/2014, 5:14 PM

## 2014-04-02 NOTE — Progress Notes (Signed)
Patient Discharge Instructions:  No documentation was faxed to Haskel Schroeder MD for HBIPS.  No ROI is available.  Patsey Berthold, 04/02/2014, 1:30 PM

## 2014-06-22 ENCOUNTER — Emergency Department (HOSPITAL_COMMUNITY)
Admission: EM | Admit: 2014-06-22 | Discharge: 2014-07-21 | Disposition: A | Discharge status: Other Acute Inpatient Hospital | Payer: Medicare Other | Attending: Emergency Medicine | Admitting: Emergency Medicine

## 2014-06-22 ENCOUNTER — Encounter (HOSPITAL_COMMUNITY): Payer: Self-pay | Admitting: Emergency Medicine

## 2014-06-22 DIAGNOSIS — F329 Major depressive disorder, single episode, unspecified: Secondary | ICD-10-CM | POA: Insufficient documentation

## 2014-06-22 DIAGNOSIS — Z3202 Encounter for pregnancy test, result negative: Secondary | ICD-10-CM | POA: Diagnosis not present

## 2014-06-22 DIAGNOSIS — S50819A Abrasion of unspecified forearm, initial encounter: Secondary | ICD-10-CM | POA: Diagnosis not present

## 2014-06-22 DIAGNOSIS — J45909 Unspecified asthma, uncomplicated: Secondary | ICD-10-CM | POA: Diagnosis not present

## 2014-06-22 DIAGNOSIS — F250 Schizoaffective disorder, bipolar type: Secondary | ICD-10-CM | POA: Diagnosis not present

## 2014-06-22 DIAGNOSIS — T1491 Suicide attempt: Secondary | ICD-10-CM | POA: Diagnosis not present

## 2014-06-22 DIAGNOSIS — L03114 Cellulitis of left upper limb: Secondary | ICD-10-CM | POA: Insufficient documentation

## 2014-06-22 DIAGNOSIS — K219 Gastro-esophageal reflux disease without esophagitis: Secondary | ICD-10-CM | POA: Diagnosis not present

## 2014-06-22 DIAGNOSIS — F840 Autistic disorder: Secondary | ICD-10-CM | POA: Diagnosis not present

## 2014-06-22 DIAGNOSIS — F419 Anxiety disorder, unspecified: Secondary | ICD-10-CM | POA: Insufficient documentation

## 2014-06-22 DIAGNOSIS — Z79899 Other long term (current) drug therapy: Secondary | ICD-10-CM | POA: Insufficient documentation

## 2014-06-22 DIAGNOSIS — R45851 Suicidal ideations: Secondary | ICD-10-CM | POA: Diagnosis not present

## 2014-06-22 DIAGNOSIS — R9431 Abnormal electrocardiogram [ECG] [EKG]: Secondary | ICD-10-CM | POA: Diagnosis not present

## 2014-06-22 DIAGNOSIS — R45850 Homicidal ideations: Secondary | ICD-10-CM | POA: Diagnosis not present

## 2014-06-22 LAB — CBC
HEMATOCRIT: 41.1 % (ref 36.0–46.0)
Hemoglobin: 13.4 g/dL (ref 12.0–15.0)
MCH: 27 pg (ref 26.0–34.0)
MCHC: 32.6 g/dL (ref 30.0–36.0)
MCV: 82.7 fL (ref 78.0–100.0)
PLATELETS: 268 10*3/uL (ref 150–400)
RBC: 4.97 MIL/uL (ref 3.87–5.11)
RDW: 13.2 % (ref 11.5–15.5)
WBC: 9 10*3/uL (ref 4.0–10.5)

## 2014-06-22 LAB — COMPREHENSIVE METABOLIC PANEL
ALBUMIN: 4.8 g/dL (ref 3.5–5.0)
ALT: 19 U/L (ref 14–54)
AST: 17 U/L (ref 15–41)
Alkaline Phosphatase: 60 U/L (ref 38–126)
Anion gap: 11 (ref 5–15)
BUN: 8 mg/dL (ref 6–20)
CO2: 23 mmol/L (ref 22–32)
Calcium: 9.7 mg/dL (ref 8.9–10.3)
Chloride: 107 mmol/L (ref 101–111)
Creatinine, Ser: 0.72 mg/dL (ref 0.44–1.00)
GFR calc non Af Amer: >60 mL/min (ref >60)
GLUCOSE: 91 mg/dL (ref 65–99)
POTASSIUM: 3.6 mmol/L (ref 3.5–5.1)
SODIUM: 141 mmol/L (ref 135–145)
TOTAL PROTEIN: 8.4 g/dL — AB (ref 6.5–8.1)
Total Bilirubin: 0.4 mg/dL (ref 0.3–1.2)

## 2014-06-22 LAB — ACETAMINOPHEN LEVEL: Acetaminophen (Tylenol), Serum: <10 ug/mL — ABNORMAL LOW (ref 10–30)

## 2014-06-22 LAB — ETHANOL

## 2014-06-22 LAB — SALICYLATE LEVEL

## 2014-06-22 MED ORDER — PANTOPRAZOLE SODIUM 40 MG PO TBEC
40.0000 mg | DELAYED_RELEASE_TABLET | Freq: Every day | ORAL | Status: DC
  Administered 2014-06-23 – 2014-07-21 (×29): 40 mg via ORAL
  Filled 2014-06-22 (×30): qty 1

## 2014-06-22 MED ORDER — ACETAMINOPHEN 325 MG PO TABS
650.0000 mg | ORAL_TABLET | ORAL | Status: DC | PRN
Start: 2014-06-22 — End: 2014-07-21
  Administered 2014-06-23 – 2014-07-16 (×40): 650 mg via ORAL
  Filled 2014-06-22 (×40): qty 2

## 2014-06-22 MED ORDER — CLONAZEPAM 1 MG PO TABS
1.0000 mg | ORAL_TABLET | Freq: Every day | ORAL | Status: DC
  Administered 2014-06-22: 1 mg via ORAL
  Filled 2014-06-22: qty 1

## 2014-06-22 MED ORDER — SENNA 8.6 MG PO TABS
2.0000 | ORAL_TABLET | Freq: Every day | ORAL | Status: DC
  Administered 2014-06-22 – 2014-07-20 (×29): 17.2 mg via ORAL
  Filled 2014-06-22 (×32): qty 2

## 2014-06-22 MED ORDER — HYDROXYZINE HCL 25 MG PO TABS
25.0000 mg | ORAL_TABLET | Freq: Three times a day (TID) | ORAL | Status: DC | PRN
  Administered 2014-06-22 – 2014-06-23 (×4): 25 mg via ORAL
  Filled 2014-06-22 (×4): qty 1

## 2014-06-22 MED ORDER — ONDANSETRON HCL 4 MG PO TABS
4.0000 mg | ORAL_TABLET | Freq: Three times a day (TID) | ORAL | Status: DC | PRN
  Administered 2014-06-26 – 2014-07-20 (×6): 4 mg via ORAL
  Filled 2014-06-22 (×6): qty 1

## 2014-06-22 MED ORDER — PRAZOSIN HCL 2 MG PO CAPS
2.0000 mg | ORAL_CAPSULE | Freq: Every day | ORAL | Status: DC
  Administered 2014-06-22 – 2014-07-20 (×29): 2 mg via ORAL
  Filled 2014-06-22 (×31): qty 1

## 2014-06-22 MED ORDER — RISPERIDONE 2 MG PO TABS
2.0000 mg | ORAL_TABLET | Freq: Two times a day (BID) | ORAL | Status: DC
  Administered 2014-06-22 – 2014-07-20 (×57): 2 mg via ORAL
  Filled 2014-06-22 (×58): qty 1

## 2014-06-22 MED ORDER — ESCITALOPRAM OXALATE 10 MG PO TABS
10.0000 mg | ORAL_TABLET | Freq: Every day | ORAL | Status: DC
  Administered 2014-06-23: 10 mg via ORAL
  Filled 2014-06-22 (×2): qty 1

## 2014-06-22 MED ORDER — ZOLPIDEM TARTRATE 5 MG PO TABS: 5.0000 mg | ORAL_TABLET | Freq: Every evening | ORAL | Status: DC | PRN

## 2014-06-22 MED ORDER — IBUPROFEN 200 MG PO TABS
600.0000 mg | ORAL_TABLET | Freq: Three times a day (TID) | ORAL | Status: DC | PRN
  Administered 2014-06-26 – 2014-07-20 (×20): 600 mg via ORAL
  Filled 2014-06-22 (×21): qty 3
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
## 2014-06-22 NOTE — ED Notes (Signed)
Patient restless, anxious. Reports SI "if I go back there, and I'll hurt that lady too". Anxiety 10/10, depression 5/10. Feels that "old lady" from the group home is mean. Threatening to harm herself if sent back there.  Encouragement offered.  Q 15 safety checks in place.  Patient has stuffed animal in her room.
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
## 2014-06-22 NOTE — ED Notes (Signed)
[facility] Expected date: 06/22/14 Expected time: 10:46 PM Means of arrival:  Comments: Hold for Risher S
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
## 2014-06-23 DIAGNOSIS — R45851 Suicidal ideations: Secondary | ICD-10-CM | POA: Diagnosis not present

## 2014-06-23 DIAGNOSIS — F250 Schizoaffective disorder, bipolar type: Secondary | ICD-10-CM

## 2014-06-23 DIAGNOSIS — L03114 Cellulitis of left upper limb: Secondary | ICD-10-CM | POA: Diagnosis not present

## 2014-06-23 LAB — RAPID URINE DRUG SCREEN, HOSP PERFORMED
AMPHETAMINES: NOT DETECTED
Barbiturates: NOT DETECTED
Benzodiazepines: NOT DETECTED
Cocaine: NOT DETECTED
Opiates: NOT DETECTED
Tetrahydrocannabinol: NOT DETECTED

## 2014-06-23 MED ORDER — LORAZEPAM 2 MG/ML IJ SOLN: 2.0000 mg | Freq: Once | INTRAMUSCULAR | Status: DC

## 2014-06-23 MED ORDER — CLONAZEPAM 0.5 MG PO TABS
0.5000 mg | ORAL_TABLET | Freq: Two times a day (BID) | ORAL | Status: DC
  Administered 2014-06-23 – 2014-07-21 (×56): 0.5 mg via ORAL
  Filled 2014-06-23 (×56): qty 1

## 2014-06-23 MED ORDER — DIVALPROEX SODIUM 250 MG PO DR TAB: 250.0000 mg | DELAYED_RELEASE_TABLET | Freq: Two times a day (BID) | ORAL | Status: DC

## 2014-06-23 MED ORDER — ZIPRASIDONE MESYLATE 20 MG IM SOLR
INTRAMUSCULAR | Status: AC
  Filled 2014-06-23: qty 20

## 2014-06-23 MED ORDER — ZIPRASIDONE MESYLATE 20 MG IM SOLR
20.0000 mg | Freq: Once | INTRAMUSCULAR | Status: AC
  Administered 2014-06-23: 20 mg via INTRAMUSCULAR

## 2014-06-23 MED ORDER — DIPHENHYDRAMINE HCL 50 MG/ML IJ SOLN
50.0000 mg | Freq: Once | INTRAMUSCULAR | Status: AC
  Administered 2014-06-23: 50 mg via INTRAVENOUS

## 2014-06-23 MED ORDER — DOCUSATE SODIUM 100 MG PO CAPS
100.0000 mg | ORAL_CAPSULE | Freq: Two times a day (BID) | ORAL | Status: DC | PRN
  Administered 2014-06-24 – 2014-07-20 (×27): 100 mg via ORAL
  Filled 2014-06-23 (×34): qty 1

## 2014-06-23 MED ORDER — DIPHENHYDRAMINE HCL 50 MG/ML IJ SOLN
INTRAMUSCULAR | Status: AC
  Administered 2014-06-23: 50 mg via INTRAOCULAR
  Filled 2014-06-23: qty 1

## 2014-06-23 MED ORDER — DOCUSATE SODIUM 100 MG PO CAPS
100.0000 mg | ORAL_CAPSULE | Freq: Once | ORAL | Status: AC
  Administered 2014-06-23: 100 mg via ORAL
  Filled 2014-06-23 (×2): qty 1

## 2014-06-23 MED ORDER — LORAZEPAM 2 MG/ML IJ SOLN
2.0000 mg | Freq: Four times a day (QID) | INTRAMUSCULAR | Status: DC
  Administered 2014-06-23: 2 mg via INTRAMUSCULAR
  Filled 2014-06-23: qty 1

## 2014-06-23 MED ORDER — CHOLECALCIFEROL 10 MCG (400 UNIT) PO TABS
400.0000 [IU] | ORAL_TABLET | Freq: Every day | ORAL | Status: DC
  Administered 2014-06-23 – 2014-07-21 (×29): 400 [IU] via ORAL
  Filled 2014-06-23 (×30): qty 1

## 2014-06-23 MED ORDER — HYDROXYZINE HCL 25 MG PO TABS
50.0000 mg | ORAL_TABLET | Freq: Four times a day (QID) | ORAL | Status: DC | PRN
  Administered 2014-06-24 – 2014-07-20 (×56): 50 mg via ORAL
  Filled 2014-06-23 (×58): qty 2

## 2014-06-23 NOTE — ED Notes (Signed)
Patient reports trouble falling asleep. Continuing to color, crying stopped.

## 2014-06-23 NOTE — ED Notes (Signed)
Patient appears calm, speech slow; quiet. Reports SI and thoughts of self-harm. Reports HI directed at the worker from her group home. Denies AVH. Rates feelings of anxiety 7/10 and feelings of depression 10/10.  Inquired about taking her medications. States she takes them at 1900 at home.  Encouragement offered. Snack provided.  Q 15 safety checks continue. MHT at bedside.

## 2014-06-23 NOTE — ED Notes (Signed)
Iliza states she does "not feel safe by herself".  1:1 continued.

## 2014-06-23 NOTE — ED Notes (Signed)
Patient began verbal aggression towards staff following provider rounds. Impossible to redirect.  PO meds given.  Patient cont to scratch forearm, bang on wall , threw chair, and would not contract for sfaety or listen to redirection.  Report to MD. Soft restraint order @ 1050 and ordered medication. Continuous observation and therapeutic discussion continued.  Fluids offered.

## 2014-06-23 NOTE — ED Notes (Signed)
Patient resting with eyes closed and resp even and unlabored.  Hold 1:1 and cont 15' checks for safety.
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
## 2014-06-23 NOTE — ED Notes (Signed)
1:1 initiated per [facility] AC and AD. Calm and cooperative at present.

## 2014-06-23 NOTE — ED Notes (Signed)
Alert and oriented.  Reluctant to contract for safety.

## 2014-06-23 NOTE — ED Notes (Signed)
Patient was observed awakening and making a gesture to hit her head on the wall per MHT.  Continuous observation resumed.  Redirection and support as needed.

## 2014-06-23 NOTE — ED Notes (Signed)
Patient expresses SI to Probation officer. Declines additional Vistaril at present stating "it's not going to work".  Encouragement offered. MHT remains at bedside. Q 15 safety checks continue.

## 2014-06-23 NOTE — ED Notes (Signed)
Patient tearful, scratching at her left forearm. States "I am sad".   Distraction offered. Patient coloring at present.

## 2014-06-23 NOTE — Progress Notes (Addendum)
CSW spoke with pt guardian, Rudy Jew from [place] [facility] [place]. Patient is a new client to the [facility] [place] however they have been working with patient since her discharge from [facility]. Pt guardian stated that patient call him yesterday stating that she wanted to go back to [facility] for 3 months and wait for the new group home to open in [place] which is closer to patient fiance. Pt guardian also shared that patient was suppose to be visited by her parents this weekend and those plans were postponed and patient became upset. Pt guardian states that he has tried to talk with patient by phone a few times this morning and patient has not been able to have a logical conversation and is unable reason with pt at this time. Pt guardian states, pt is determined to get back to [facility]. Pt guardian faxing over assessments from [facility] during previous hospitalization.   Belia Heman, [place] Work  [employer] [phone]

## 2014-06-23 NOTE — Progress Notes (Signed)
Pt referred to [facility], authoirzation # [id number] 5/18 to 5/24. Pt referral faxed to [phone]. CSW completed phone demographics with [facility]. Pt referral under review awaiting call back to determine acceptance to waitlist.   Belia Heman, [place] Work  [employer] [phone]

## 2014-06-24 DIAGNOSIS — L03114 Cellulitis of left upper limb: Secondary | ICD-10-CM | POA: Diagnosis not present

## 2014-06-24 DIAGNOSIS — R45851 Suicidal ideations: Secondary | ICD-10-CM | POA: Diagnosis not present

## 2014-06-24 DIAGNOSIS — F250 Schizoaffective disorder, bipolar type: Secondary | ICD-10-CM | POA: Diagnosis not present

## 2014-06-24 LAB — PREGNANCY, URINE: PREG TEST UR: NEGATIVE

## 2014-06-24 MED ORDER — CARBAMAZEPINE 200 MG PO TABS
400.0000 mg | ORAL_TABLET | Freq: Every day | ORAL | Status: DC
  Administered 2014-06-24 – 2014-06-26 (×3): 400 mg via ORAL
  Filled 2014-06-24 (×5): qty 2

## 2014-06-24 MED ORDER — SERTRALINE HCL 50 MG PO TABS
50.0000 mg | ORAL_TABLET | Freq: Every day | ORAL | Status: DC
  Administered 2014-06-24 – 2014-07-01 (×8): 50 mg via ORAL
  Filled 2014-06-24 (×8): qty 1

## 2014-06-24 NOTE — ED Notes (Signed)
On the phone 

## 2014-06-24 NOTE — BH Assessment (Signed)
[place] Assessment Progress Note  At 10:46 Marlowe Kays called from [facility].  Pt has been placed on their wait list.  [name], [place] Triage Specialist [phone]

## 2014-06-24 NOTE — ED Notes (Signed)
Patient tearful.  MHT remains at bedside. Q 15 safety checks continue.

## 2014-06-24 NOTE — ED Notes (Signed)
Bed: [facility] Expected date:  Expected time:  Means of arrival:  Comments: Hold for 34

## 2014-06-24 NOTE — ED Notes (Signed)
Up to the bathroom 

## 2014-06-24 NOTE — ED Notes (Signed)
Patient appears calm, flat. Denies SI, HI, AVH at present. Reports that outbursts from the patient in bed 40 are bothering her. C/o headache and increasing anxiety. States she is having thoughts to scratch at her arms.   Encouragement offered. Given Tylenol and Vistaril.   MHT at bedside.  Q 15 safety checks continue.

## 2014-06-24 NOTE — ED Notes (Signed)
Pt moved to room 37  

## 2014-06-24 NOTE — ED Notes (Signed)
Patient observed in bed with quick shallow breaths and rocking back and forth. Vitals taken, comfort offered. Patient reports "panic attack" after calming down.

## 2014-06-24 NOTE — ED Notes (Signed)
Up to the bathroom to shower and change srubs

## 2014-06-24 NOTE — ED Notes (Signed)
Pt Request vistaril for " I will get upset when the doctor gets here.  I go from  0 to 100 and you cant calm me down once that happens"    Medicated as per order.

## 2014-06-24 NOTE — ED Notes (Signed)
Patient agitated by outbursts from bed 40. Patient attempted to go to the other side of the unit to confront the patient from bed 40. Security and [employer] to the unit to assist. Patient escorted back to her room by MHT, [employer], and security.   One to one remains. Security remains on unit. Q 15 safety checks continue.

## 2014-06-24 NOTE — ED Notes (Signed)
Patient voiced to MHT at bedside that "I feel like acting out because I'm".  [place] AC to notified.
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
## 2014-06-25 DIAGNOSIS — F250 Schizoaffective disorder, bipolar type: Secondary | ICD-10-CM | POA: Diagnosis not present

## 2014-06-25 DIAGNOSIS — R45851 Suicidal ideations: Secondary | ICD-10-CM | POA: Diagnosis not present

## 2014-06-25 DIAGNOSIS — L03114 Cellulitis of left upper limb: Secondary | ICD-10-CM | POA: Diagnosis not present

## 2014-06-25 MED ORDER — LORAZEPAM 1 MG PO TABS
1.0000 mg | ORAL_TABLET | Freq: Once | ORAL | Status: AC
  Administered 2014-06-25: 1 mg via ORAL
  Filled 2014-06-25: qty 1

## 2014-06-25 MED ORDER — LORAZEPAM 1 MG PO TABS: 1.0000 mg | ORAL_TABLET | Freq: Once | ORAL | Status: DC

## 2014-06-25 NOTE — ED Notes (Signed)
Pt. States " I want to bleed to relieve my Pain"  When asked if she was hurting she said "Emotional pain"   Encouraged pt. To talk and offered coloring pages .  Pt requested her prn Vistaril.  See MAR.   Pt. Agreed to stop hurting herself.

## 2014-06-25 NOTE — ED Notes (Signed)
Pt. Noted in room. Pt. C/o anxiety. No acute distress noted. Will continue to monitor with security cameras. Q 15 minute rounds continue.

## 2014-06-25 NOTE — ED Notes (Signed)
Pt. Resting quietly.  Denies SI HI and AVH,continues to appear sullen and quiet.  Encouraged pt. To discuss feelings.

## 2014-06-25 NOTE — ED Notes (Signed)
Pt. Noted in room. No acute distress noted. Will continue to monitor with security cameras. Q 15 minute rounds continue.

## 2014-06-25 NOTE — BH Assessment (Signed)
[facility] Assessment Progress Note  The following facilities have been contacted to seek placement for this patient with results as noted:  Beds available, information sent, decision pending:  [facility] available, attempted to send information without success:  [facility]  No beds available, but accepting referrals for future consideration; information sent:  [facility]  At capacity:  [facility] [place] Triage Specialist [phone]
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
## 2014-06-25 NOTE — ED Notes (Signed)
Report received from [facility]. Pt. Alert and oriented in no distress denies SI, AVH and pain.  Pt. Instructed to come to me with problems or concerns.Will continue to monitor for safety via security cameras and Q 15 minute checks.

## 2014-06-25 NOTE — Progress Notes (Signed)
Pt discussed in [facility] progression meeting Pt not [place] candidate discussed. This Group home disposition discussed with covering SW

## 2014-06-25 NOTE — ED Notes (Signed)
Pt. C/o insomnia and anxiety as well as generalized pain.
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
## 2014-06-25 NOTE — ED Notes (Signed)
Pt. Attention seeking by scratching her left arm  to keep Tech in the room. Attempts at redirection by [facility] MHT.

## 2014-06-25 NOTE — ED Notes (Signed)
Pt crying constantly.  Up in hallway.

## 2014-06-25 NOTE — ED Notes (Addendum)
NP Ijeoma made aware of patients attempts to scratch herself. Nursing staff directed to continue to redirect patient and avoid self injury by distraction, while waiting for medications to take effect.

## 2014-06-25 NOTE — BH Assessment (Signed)
Seeking inpt placement. Sent referrals to:  [place], [facility] Triage Specialist 06/25/2014 5:04 AM

## 2014-06-25 NOTE — BH Assessment (Signed)
[facility] Assessment Progress Note  At 10:00 Connie at [facility] reports that pt remains on their wait list.  [name], MA Triage Specialist [phone]

## 2014-06-26 DIAGNOSIS — R45851 Suicidal ideations: Secondary | ICD-10-CM | POA: Diagnosis not present

## 2014-06-26 DIAGNOSIS — F250 Schizoaffective disorder, bipolar type: Secondary | ICD-10-CM | POA: Diagnosis not present

## 2014-06-26 DIAGNOSIS — L03114 Cellulitis of left upper limb: Secondary | ICD-10-CM | POA: Diagnosis not present

## 2014-06-26 MED ORDER — TRIPLE ANTIBIOTIC 3.5-400-5000 EX OINT
TOPICAL_OINTMENT | Freq: Two times a day (BID) | CUTANEOUS | Status: DC
  Administered 2014-06-26 – 2014-07-04 (×15): via TOPICAL
  Administered 2014-07-05 – 2014-07-07 (×6): 1 via TOPICAL
  Filled 2014-06-26: qty 1
  Filled 2014-06-26 (×2): qty 15

## 2014-06-26 NOTE — ED Notes (Addendum)
[id number]: Pt began scratching herself w/ her fingernail.  Pt could iniatly be redirected but scratching became progressive and she  could not be redirected or distracted. Her hands had to be held to assist her from not scratching.  Support given. [place] NP contacted, place back on 1:1, contact EDP for soft wrist restraints.

## 2014-06-26 NOTE — ED Notes (Signed)
Pt sitting quietly on the bed drawing.  Calm cooperative, nad.  Sitter at bedside.

## 2014-06-26 NOTE — ED Notes (Signed)
Pt. Lying on the floor in front of nurses station. Pt. encouraged to go to her room. Pt. Declined. Will continue to redirect and observe patient for safety.
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
## 2014-06-26 NOTE — ED Notes (Signed)
Pt. Noted sleeping in room. No complaints or concerns voiced. No distress or abnormal behavior noted. Will continue to monitor with security cameras. Q 15 minute rounds continue. 

## 2014-06-26 NOTE — ED Notes (Signed)
Pt. Noted in room with sitter at bedside. No complaints or concerns voiced. No distress or abnormal behavior noted. Will continue to monitor with security cameras. Q 15 minute rounds continue.

## 2014-06-26 NOTE — ED Notes (Signed)
Soft restraints applied both wrists.  Pt is aware that she must stop scratching for them to be removed. Sitter at bedside

## 2014-06-26 NOTE — ED Notes (Signed)
Drawing in room, snack given

## 2014-06-26 NOTE — ED Notes (Signed)
Pt. At nurses station requesting water. No complaints or concerns voiced. No distress or abnormal behavior noted. Will continue to monitor with security cameras. Q 15 minute rounds continue.

## 2014-06-26 NOTE — ED Notes (Addendum)
Pt laying on the bed, calm, sitter at bedside.  Pt agrees not to scratch, rt wrist released at 1715, skin intact, moves all digits.
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
## 2014-06-26 NOTE — ED Notes (Signed)
More coloring materials given

## 2014-06-26 NOTE — ED Notes (Signed)
Pt. Sitting on floor in front of nurses station. Encouraged to go to her room and lay down. Pt. Declined.

## 2014-06-26 NOTE — ED Notes (Signed)
Pt coloring queilty in room.

## 2014-06-26 NOTE — ED Notes (Signed)
Sitting quietly in room drawing. Pt reports that her HA is returning and is 10/10.  Medicated, ice pack given., NAD.

## 2014-06-26 NOTE — ED Notes (Signed)
Drawing quietly in room

## 2014-06-26 NOTE — ED Notes (Signed)
Up to the bathroom 

## 2014-06-26 NOTE — ED Notes (Addendum)
Pt sitting in room drawing, reports "over whelming desire to hurt myself." Support given, other ways to distract herself discussed w/ pt. Pt encouraged to focus on her goals and sucesses. This Probation officer remained w/ patient.

## 2014-06-26 NOTE — ED Notes (Signed)
C/o of nausea, reports HA has improved

## 2014-06-26 NOTE — ED Notes (Signed)
Pt. Coloring with sitter at bedside.

## 2014-06-26 NOTE — ED Notes (Signed)
Up on the phone 

## 2014-06-26 NOTE — ED Notes (Signed)
Pt. Noted in room coloring with sitter at bedside. No complaints or concerns voiced. No distress or abnormal behavior noted. Will continue to monitor with security cameras. Q 15 minute rounds continue.

## 2014-06-26 NOTE — ED Notes (Signed)
Pt laying in bed noted to be scratching her arm w/ her fingernail.  Pt instructed to quit and was able to stop.  Pt reports that she started because she is "frustrated".  Pt encouraged to discuss what is frustrating her but was fague about it.  Pt also reminded that she had agreed to talk to staff before she scratched.  Pt reports she is still slt nauseated, but ha has improved.  Pillow case around lt are, pt eatting  Pop. NAD.  Pt again agreed to talk to staff if she feels like she needs to scratch herself.

## 2014-06-26 NOTE — ED Notes (Addendum)
Sitter at bedside, pt clam, agrees not scratch herself. Lt wrist restraint removed. PO fluids offered.  No new abrasions noted, but abrasion inner lt wrist appears worse.

## 2014-06-26 NOTE — ED Notes (Signed)
Pt. Up to rest room. Pt. Back from rest room laying on stretcher with sitter at bedside. Pt. Attempting to rub her left arm with her right hand. Pt. Redirected to stop self injurious behavior and distracted with coloring.

## 2014-06-26 NOTE — ED Notes (Signed)
Pt. Noted in room. Arlester Marker at bedside. Pt. Calm at this time.

## 2014-06-26 NOTE — ED Notes (Signed)
Sitter at bedside, pt. Calm.

## 2014-06-26 NOTE — ED Notes (Signed)
On the phone 

## 2014-06-26 NOTE — ED Notes (Signed)
[place] notified of pt begin placed back on 1:1

## 2014-06-26 NOTE — ED Notes (Signed)
Pt. Up to restroom. 

## 2014-06-26 NOTE — ED Notes (Signed)
Otila Kluver RN in talking w/ pt

## 2014-06-27 DIAGNOSIS — R45851 Suicidal ideations: Secondary | ICD-10-CM | POA: Diagnosis not present

## 2014-06-27 DIAGNOSIS — F250 Schizoaffective disorder, bipolar type: Secondary | ICD-10-CM | POA: Diagnosis not present

## 2014-06-27 DIAGNOSIS — L03114 Cellulitis of left upper limb: Secondary | ICD-10-CM | POA: Diagnosis not present

## 2014-06-27 MED ORDER — LORAZEPAM 1 MG PO TABS
2.0000 mg | ORAL_TABLET | Freq: Once | ORAL | Status: AC
  Administered 2014-06-27: 2 mg via ORAL
  Filled 2014-06-27: qty 2

## 2014-06-27 NOTE — ED Notes (Addendum)
Pt. Noted sleeping in room with sitter at bedside. No complaints or concerns voiced. No distress or abnormal behavior noted. Will continue to monitor with security cameras. Q 15 minute rounds continue.

## 2014-06-27 NOTE — ED Notes (Signed)
Up tot he bathroom to shower and change scrubs 

## 2014-06-27 NOTE — ED Notes (Signed)
Pt. Noted sleeping in room with sitter at bedside. No complaints or concerns voiced. No distress or abnormal behavior noted. Will continue to monitor with security cameras. Q 15 minute rounds continue.

## 2014-06-27 NOTE — ED Notes (Signed)
Pt. Noted in room with sitter at bedside. No complaints or concerns voiced. No distress or abnormal behavior noted. Will continue to monitor with security cameras. Q 15 minute rounds continue.
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
## 2014-06-27 NOTE — ED Notes (Signed)
Theodoro Clock NP into see

## 2014-06-27 NOTE — ED Notes (Signed)
Up to the bathroom 

## 2014-06-27 NOTE — ED Notes (Signed)
Pt. Repeatedly ringing call button and laughing when staff come to the room. Pt. Rubbing her left arm "because I'm bored". Consulted with Ijeoma. Will continue to observe for safety and keep staff in room to a minimum.

## 2014-06-27 NOTE — ED Notes (Signed)
Up on the phone 

## 2014-06-27 NOTE — ED Notes (Signed)
Pt c/o continued HA, medicated, ice pk to head. Sitter at bedside.  PO fluids encouraged
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
## 2014-06-27 NOTE — ED Notes (Signed)
Pt. Has caused several new abrasions on left arm. Ijeoma made aware.

## 2014-06-27 NOTE — ED Notes (Signed)
Pt reports that he HA's have been reocurring when she sits up and that this is different from her regular HA's.  Pt reports that she noticed that this started when she started on the tegretol.  Pt requesting to talk to MD, Dr Jeani Hawking NP updated.  Pt sitting in room drawing.

## 2014-06-27 NOTE — ED Notes (Signed)
Pt. Noted sleeping in room with sitter at beside. No complaints or concerns voiced. No distress or abnormal behavior noted. Will continue to monitor with security cameras. Q 15 minute rounds continue.

## 2014-06-27 NOTE — ED Notes (Signed)
Pt. Noted in room with sitter at bedside. No distress or abnormal behavior noted. Will continue to monitor with security cameras. Q 15 minute rounds continue.

## 2014-06-27 NOTE — ED Notes (Addendum)
Pt. Noted in room with sitter at bedside. Pt. Still intermittently rubbing her left arm. No acute distress noted. Will continue to monitor with security cameras. Q 15 minute rounds continue.

## 2014-06-27 NOTE — ED Notes (Signed)
Pt. Noted sleeping in room with sitter at bedside.. No complaints or concerns voiced. No distress or abnormal behavior noted. Will continue to monitor with security cameras. Q 15 minute rounds continue.

## 2014-06-28 DIAGNOSIS — R45851 Suicidal ideations: Secondary | ICD-10-CM | POA: Diagnosis not present

## 2014-06-28 DIAGNOSIS — F250 Schizoaffective disorder, bipolar type: Secondary | ICD-10-CM | POA: Diagnosis not present

## 2014-06-28 DIAGNOSIS — L03114 Cellulitis of left upper limb: Secondary | ICD-10-CM | POA: Diagnosis not present

## 2014-06-28 MED ORDER — GABAPENTIN 300 MG PO CAPS
300.0000 mg | ORAL_CAPSULE | Freq: Two times a day (BID) | ORAL | Status: DC
  Administered 2014-06-28 – 2014-06-30 (×5): 300 mg via ORAL
  Filled 2014-06-28 (×5): qty 1

## 2014-06-28 MED ORDER — DIPHENHYDRAMINE HCL 25 MG PO CAPS
50.0000 mg | ORAL_CAPSULE | Freq: Once | ORAL | Status: AC
  Administered 2014-06-28: 50 mg via ORAL
  Filled 2014-06-28: qty 2

## 2014-06-28 MED ORDER — GABAPENTIN 600 MG PO TABS
300.0000 mg | ORAL_TABLET | Freq: Two times a day (BID) | ORAL | Status: DC
  Filled 2014-06-28 (×2): qty 0.5

## 2014-06-28 MED ORDER — LORAZEPAM 1 MG PO TABS
1.0000 mg | ORAL_TABLET | Freq: Once | ORAL | Status: AC
  Administered 2014-06-28: 1 mg via ORAL
  Filled 2014-06-28: qty 1

## 2014-06-28 NOTE — ED Notes (Signed)
Patient requested Visteril at the same time I was giving her the other scheduled medications that were due.  I explained to her that she was getting that she was receiving other medications which should help to control her anxiety.  I encouraged her to take her scheduled medicines and wait an hour or so to see if they helped.  She then started banging her fist on the wall in her room, she then moved over to the door and started banging on the glass.  She was asked to stop and she did finally lie on the bed and start crying.  She is no longer banging and has been encouraged to write her feelings down and to work on her coloring sheets.

## 2014-06-28 NOTE — ED Notes (Signed)
Pt. Stopped rubbing her arm and is now coloring.

## 2014-06-28 NOTE — ED Notes (Signed)
Pt. Noted in room with sitter at bedside. Pt. Still intermittently rubbing her left arm. No acute distress noted. Will continue to monitor with security cameras. Q 15 minute rounds continue.
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
## 2014-06-28 NOTE — ED Notes (Addendum)
Pt AAO x 3, 1-1 sitter at bedside, pt cooperative at present, scratch marks noted to Left arm.  No distress noted, monitoring for safety, Q 15 min checks in effect.

## 2014-06-28 NOTE — ED Notes (Signed)
PT is resting, VS not done

## 2014-06-28 NOTE — ED Notes (Signed)
Patient continues with 1:1.  She was scratching on her right wrist this morning.  States her left arm is hurting her.  She was distracted from scratching with some juice and some coloring pages.  She was encouraged to talk about what was bothering her.  She was able to sit and color and she wrote a letter for the doctor to help her to refocus.  She remains safe at this time.

## 2014-06-28 NOTE — ED Notes (Signed)
Pt. Up to rest room to wash her hands.

## 2014-06-28 NOTE — ED Notes (Signed)
Pt. Noted sleeping in room with sitter at bedside. No complaints or concerns voiced. No distress or abnormal behavior noted. Will continue to monitor with security cameras. Q 15 minute rounds continue.

## 2014-06-28 NOTE — ED Notes (Signed)
Patients guardian was in to visit and after he left she started scratching on her right forearm again.  When patient was asked to stop she started scratching on her chest area.  I reminded her that she would have to be restrained to protect her from hurting herself.  She then started crying and acted as though she were hyperventilating and stated she could not breath and was having a panic attack.  When talking to her she was able to normalize her breathing to answer questions.  Spoke with Eston Mould., NP and he gave orders for diphenhydramine and lorazepam PO.  Patient did not want to take them until I advised her that she would get them IM if she refused the PO formulation.  Patient took the medications and is currently sitting on her bed coloring.

## 2014-06-28 NOTE — ED Notes (Signed)
Pt. Noted sleeping in room. No complaints or concerns voiced. No distress or abnormal behavior noted. Will continue to monitor with security cameras. Q 15 minute rounds continue. 

## 2014-06-28 NOTE — ED Notes (Signed)
Pt. Noted in room with sitter at bedside. No complaints or concerns voiced. No distress or abnormal behavior noted. Will continue to monitor with security cameras. Q 15 minute rounds continue.

## 2014-06-28 NOTE — ED Notes (Signed)
Caleb Popp NP in to see pt.

## 2014-06-28 NOTE — ED Notes (Signed)
Neosporin applied to scratches on left arm at pts. Request.

## 2014-06-29 DIAGNOSIS — R45851 Suicidal ideations: Secondary | ICD-10-CM | POA: Diagnosis not present

## 2014-06-29 DIAGNOSIS — L03114 Cellulitis of left upper limb: Secondary | ICD-10-CM | POA: Diagnosis not present

## 2014-06-29 DIAGNOSIS — F250 Schizoaffective disorder, bipolar type: Secondary | ICD-10-CM | POA: Diagnosis not present

## 2014-06-29 MED ORDER — DOXYCYCLINE HYCLATE 100 MG PO TABS
100.0000 mg | ORAL_TABLET | Freq: Two times a day (BID) | ORAL | Status: AC
  Administered 2014-06-29 – 2014-07-09 (×20): 100 mg via ORAL
  Filled 2014-06-29 (×22): qty 1

## 2014-06-29 NOTE — ED Notes (Signed)
Pt scratching rt wrist at present, 1-1 sitter remains at bedside,  PA Spencer notified, states continue to observe pt and pt will be reassessed by Psychiatrist this am.

## 2014-06-29 NOTE — ED Notes (Signed)
Dr Tawnya Crook at bedside to evalulate Left forearm. reddness site marked with site marker as per Dr Tawnya Crook.
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
## 2014-06-29 NOTE — ED Notes (Signed)
Pt AAO x 3, 1-1 sitter at bedside, no acute distress noted, monitoring for safety, q15 min checks in effect.

## 2014-06-29 NOTE — Progress Notes (Signed)
CSW spoke with Marlowe Kays at [facility], patient is close to the top of the list. Pt previous social worker at [facility] has been in contact with guardian, at the arc of [place], and are concenred about patient as she was only discharged to group home for 5 days before another crisis requiring hospitalization. [facility] is looking at extending patient stay further than previous admissions.   Belia Heman, [place] Work  [employer] [phone]

## 2014-06-29 NOTE — ED Provider Notes (Signed)
Pt has area of developing cellulitis of her L forearm. Will start PO abx for 10d.   1. Suicidal ideation   2. Homicidal ideation   3. Cellulitis of left arm      Ernestina Patches, MD 06/30/14 1029

## 2014-06-30 DIAGNOSIS — L03114 Cellulitis of left upper limb: Secondary | ICD-10-CM | POA: Diagnosis not present

## 2014-06-30 DIAGNOSIS — R45851 Suicidal ideations: Secondary | ICD-10-CM | POA: Diagnosis not present

## 2014-06-30 DIAGNOSIS — F250 Schizoaffective disorder, bipolar type: Secondary | ICD-10-CM | POA: Diagnosis not present

## 2014-06-30 MED ORDER — LORAZEPAM 2 MG/ML IJ SOLN: 2.0000 mg | Freq: Once | INTRAMUSCULAR | Status: DC

## 2014-06-30 MED ORDER — OLANZAPINE 10 MG IM SOLR
10.0000 mg | Freq: Once | INTRAMUSCULAR | Status: AC | PRN
  Administered 2014-06-30: 10 mg via INTRAMUSCULAR
  Filled 2014-06-30: qty 10

## 2014-06-30 MED ORDER — DIPHENHYDRAMINE HCL 50 MG/ML IJ SOLN
50.0000 mg | Freq: Once | INTRAMUSCULAR | Status: AC
  Administered 2014-06-30: 50 mg via INTRAMUSCULAR
  Filled 2014-06-30: qty 1

## 2014-06-30 MED ORDER — GABAPENTIN 300 MG PO CAPS
600.0000 mg | ORAL_CAPSULE | Freq: Two times a day (BID) | ORAL | Status: DC
  Administered 2014-06-30 – 2014-07-04 (×8): 600 mg via ORAL
  Filled 2014-06-30 (×9): qty 2

## 2014-06-30 MED ORDER — LORAZEPAM 2 MG/ML IJ SOLN
1.0000 mg | Freq: Once | INTRAMUSCULAR | Status: AC
  Administered 2014-06-30: 1 mg via INTRAMUSCULAR
  Filled 2014-06-30: qty 1
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
## 2014-06-30 NOTE — ED Notes (Signed)
Pt AAO x 3, no acute distress noted, calm & cooperative at present, 1-1 sitter at bedside.  Monitoring for safety, Q 15 min checks in effect.
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
## 2014-06-30 NOTE — Progress Notes (Signed)
CSW spoke with CSW at [facility] regarding patient referral. Per [place], patient is presenting with behaviors in order to get to her fiance in [place] once the new group home opens up. Our providers also agree. However due to patient continued self harm, verbal aggression, and unable to contract for safety inpatient treatment is recommended at this time. [facility] in agreement.   Belia Heman, [place] Work  [employer] [phone]

## 2014-06-30 NOTE — Progress Notes (Signed)
Patient remains on [facility] waitlist per Marlowe Kays.   [name], [place] Work  [employer] [phone]

## 2014-07-01 DIAGNOSIS — F250 Schizoaffective disorder, bipolar type: Secondary | ICD-10-CM | POA: Diagnosis not present

## 2014-07-01 DIAGNOSIS — L03114 Cellulitis of left upper limb: Secondary | ICD-10-CM | POA: Diagnosis not present

## 2014-07-01 DIAGNOSIS — R45851 Suicidal ideations: Secondary | ICD-10-CM | POA: Diagnosis not present

## 2014-07-01 MED ORDER — SERTRALINE HCL 50 MG PO TABS
75.0000 mg | ORAL_TABLET | Freq: Every day | ORAL | Status: DC
  Administered 2014-07-02 – 2014-07-20 (×19): 75 mg via ORAL
  Filled 2014-07-01 (×23): qty 2
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
## 2014-07-01 NOTE — ED Notes (Addendum)
Pt with 1-1 sitter, AAO x 3, no acute distress noted, cooperative but anxious at present.  Monitoring for safety, Q 15 min checks in effect.

## 2014-07-01 NOTE — ED Notes (Signed)
Pt c/o wanting to hit wall.  Medicated pt with scheduled meds and spoke with her.  Encouraged her to color and talk about her feelings.  1:1 sitter at bedside. Will continue to monitor.

## 2014-07-01 NOTE — Progress Notes (Signed)
Per Robinette pt remains on [facility] waitlist.   [name], [place] Work  [employer] [phone]

## 2014-07-01 NOTE — ED Notes (Signed)
Previous note charted on wrong pt 

## 2014-07-02 DIAGNOSIS — L03114 Cellulitis of left upper limb: Secondary | ICD-10-CM | POA: Diagnosis not present

## 2014-07-02 DIAGNOSIS — F250 Schizoaffective disorder, bipolar type: Secondary | ICD-10-CM | POA: Diagnosis not present

## 2014-07-02 DIAGNOSIS — R45851 Suicidal ideations: Secondary | ICD-10-CM | POA: Diagnosis not present

## 2014-07-02 NOTE — ED Notes (Signed)
Pt. Noted sleeping in room. No complaints or concerns voiced. No distress or abnormal behavior noted. Will continue to monitor with security cameras. Q 15 minute rounds continue. 

## 2014-07-02 NOTE — ED Notes (Signed)
Pt. Noted in room with sitter. No distress or abnormal behavior noted. Will continue to monitor with security cameras. Q 15 minute rounds continue.

## 2014-07-02 NOTE — ED Notes (Signed)
Pt. Noted in room with sitter at bedside. No complaints or concerns voiced. No distress or abnormal behavior noted. Will continue to monitor with security cameras. Q 15 minute rounds continue.
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
## 2014-07-02 NOTE — Progress Notes (Signed)
Pt remains on [place] waitlist per [facility]. Per Pamala Hurry she is 2/3 discharges from the top.   Belia Heman, [place] Work  [employer] [phone]
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
## 2014-07-03 DIAGNOSIS — L03114 Cellulitis of left upper limb: Secondary | ICD-10-CM | POA: Diagnosis not present

## 2014-07-03 DIAGNOSIS — F250 Schizoaffective disorder, bipolar type: Secondary | ICD-10-CM | POA: Diagnosis not present

## 2014-07-03 DIAGNOSIS — R45851 Suicidal ideations: Secondary | ICD-10-CM | POA: Diagnosis not present
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
## 2014-07-03 NOTE — ED Notes (Signed)
Patient resting in stretcher comfortably at this time. Patient's call bell within reach, safety measures are in place via security cameras and q 15 min safety checks. Stretcher in lowest position. Will continue to monitor.

## 2014-07-03 NOTE — ED Notes (Signed)
Pt. Noted sleeping in room. No complaints or concerns voiced. No distress or abnormal behavior noted. Will continue to monitor with security cameras. Q 15 minute rounds continue. 

## 2014-07-03 NOTE — ED Notes (Signed)
Patient is in room in bed with sitter present at bedside at this time. Patient is alert and oriented. Safety maintained at q 15 min. Will continue to monitor at this time.

## 2014-07-03 NOTE — BH Assessment (Signed)
[facility] Assessment Progress Note   Called [place] and per Maudie Mercury at [phone], pt is still on [facility] wait list.  Shaune Pascal, MS, [facility] Therapeutic Triage Specialist [facility]

## 2014-07-03 NOTE — ED Notes (Addendum)
Patient is room laying in bed and comfortable. Patient alert and oriented. Call bell within reach. Safety measures in place with sitter, q 15 minutes checks, and security cameras. Stretcher in lowest position. Will continue to monitor at this time.  Patient declined neomycin ointment because she stated "that it caused yellow drainage."

## 2014-07-03 NOTE — ED Notes (Signed)
Patient laying in stretcher comfortably, patient alert and oriented. Call bell with reach. Safety measures are place via q 15 min checks, 1:1 sitter, and security cameras. Stretcher in lowest position. Will continue to monitor accordingly.

## 2014-07-03 NOTE — ED Notes (Signed)
Pt. C/o some anxiety.
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
## 2014-07-03 NOTE — ED Notes (Signed)
Pt. Noted in room with sitter at bedside. No complaints or concerns voiced. No distress or abnormal behavior noted. Will continue to monitor with security cameras. Q 15 minute rounds continue.

## 2014-07-04 DIAGNOSIS — L03114 Cellulitis of left upper limb: Secondary | ICD-10-CM | POA: Diagnosis not present

## 2014-07-04 DIAGNOSIS — R45851 Suicidal ideations: Secondary | ICD-10-CM | POA: Diagnosis not present

## 2014-07-04 DIAGNOSIS — F250 Schizoaffective disorder, bipolar type: Secondary | ICD-10-CM | POA: Diagnosis not present

## 2014-07-04 MED ORDER — GI COCKTAIL ~~LOC~~: 30.0000 mL | Freq: Once | ORAL | Status: DC

## 2014-07-04 NOTE — ED Notes (Signed)
Patient in bed resting comfortably. Call bell within reach reach, safety measures in place via security cameras and q 15 min checks. Stretcher in lowest position, will continue to monitor.

## 2014-07-04 NOTE — ED Notes (Signed)
Pt. Noted in room. Pt is watching television. No complaints or concerns voiced. No distress or abnormal behavior noted. Will continue to monitor with security cameras. Q 15 minute rounds continue. Pt continues to have a sitter at the bedside.

## 2014-07-04 NOTE — ED Notes (Signed)
Patient resting comfortably at this time, call bell within reach. Safety measures are in place via security cameras in place, sitter at bedside, and safety checks q 15 min. Stretcher in lowest position, will continue to monitor.

## 2014-07-04 NOTE — Progress Notes (Signed)
Pt. Noted in room. No complaints or concerns voiced. No distress or abnormal behavior noted. Will continue to monitor with security cameras. Q 15 minute rounds continue. 

## 2014-07-04 NOTE — ED Notes (Signed)
Pt. Sitting on floor in hallway. No complaints or concerns voiced. Pt keeps mumbling under her breath and talking about not wanting to take Neurontin because it makes her drowsy. Will continue to monitor with security cameras. Q 15 minute rounds continue.
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
## 2014-07-04 NOTE — ED Notes (Addendum)
Patient resting comfortably at this time, call bell within reach. Safety measures are in place via security cameras in place, sitter at bedside, and safety checks q 15 min. Stretcher in lowest position, will continue to monitor.

## 2014-07-04 NOTE — ED Notes (Signed)
Report received from [facility]. Pt. Alert and oriented in no distress denies SI, HI, AVH and pain.  Pt. Instructed to come to me with problems or concerns.Will continue to monitor for safety via security cameras and Q 15 minute checks.

## 2014-07-05 DIAGNOSIS — F250 Schizoaffective disorder, bipolar type: Secondary | ICD-10-CM | POA: Diagnosis not present

## 2014-07-05 DIAGNOSIS — R45851 Suicidal ideations: Secondary | ICD-10-CM | POA: Diagnosis not present

## 2014-07-05 DIAGNOSIS — L03114 Cellulitis of left upper limb: Secondary | ICD-10-CM | POA: Diagnosis not present

## 2014-07-05 MED ORDER — SODIUM CHLORIDE 0.9 % IV BOLUS (SEPSIS): 500.0000 mL | Freq: Once | INTRAVENOUS | Status: DC

## 2014-07-05 MED ORDER — GABAPENTIN 300 MG PO CAPS
300.0000 mg | ORAL_CAPSULE | Freq: Two times a day (BID) | ORAL | Status: DC
  Administered 2014-07-05 – 2014-07-21 (×33): 300 mg via ORAL
  Filled 2014-07-05 (×33): qty 1

## 2014-07-05 MED ORDER — NALOXONE HCL 1 MG/ML IJ SOLN: 1.0000 mg | Freq: Once | INTRAMUSCULAR | Status: DC

## 2014-07-05 NOTE — ED Notes (Signed)
Pt. Noted in room. No complaints or concerns voiced. No distress or abnormal behavior noted. Will continue to monitor with security cameras. Q 15 minute rounds continue. 

## 2014-07-05 NOTE — ED Notes (Signed)
Patient is currently sleeping, vitals will be retreived at a later time, attending RN notified.

## 2014-07-05 NOTE — ED Notes (Signed)
Pt. Noted in bathroom. No complaints or concerns voiced. No distress or abnormal behavior noted. Will continue to monitor with security cameras. Q 15 minute rounds continue.

## 2014-07-05 NOTE — BH Assessment (Signed)
[place] Assessment Progress Note  At 08:42 Richardson Landry at [facility] reports that pt remains on their wait list.  [name], [place] Triage Specialist [phone]

## 2014-07-05 NOTE — ED Notes (Signed)
Pt AAO x 3, no distress noted, calm & cooperative, denies SI, HI or AV hallucinations.  Monitoring for safety, Q 15 min checks in effect.

## 2014-07-05 NOTE — ED Notes (Cosign Needed)
Pt. Noted in room. No complaints or concerns voiced. No distress or abnormal behavior noted. Will continue to monitor with security cameras. Q 15 minute rounds continue. 

## 2014-07-05 NOTE — ED Notes (Signed)
Pt. Noted in room. Pt complains of left arm pain. Rates pain 8 out of 10. Tylenol given. No distress or abnormal behavior noted. Will continue to monitor with security cameras. Q 15 minute rounds continue.

## 2014-07-05 NOTE — ED Notes (Signed)
Pt. Noted in room. No complaints or concerns voiced. No distress or abnormal behavior noted. Will continue to monitor with security cameras. Q 15 minute rounds continue.lhour

## 2014-07-05 NOTE — Progress Notes (Signed)
CM received a call from covering ED SW after she was called by [facility] to maintain pt on [facility] waiting list Pt discussed in [facility] progression meeting Pt remains on [facility] waiting list
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
## 2014-07-06 DIAGNOSIS — F250 Schizoaffective disorder, bipolar type: Secondary | ICD-10-CM | POA: Diagnosis not present

## 2014-07-06 DIAGNOSIS — R45851 Suicidal ideations: Secondary | ICD-10-CM | POA: Diagnosis not present

## 2014-07-06 DIAGNOSIS — L03114 Cellulitis of left upper limb: Secondary | ICD-10-CM | POA: Diagnosis not present

## 2014-07-06 MED ORDER — BACITRACIN-NEOMYCIN-POLYMYXIN 400-5-5000 EX OINT: 1.0000 "application " | TOPICAL_OINTMENT | Freq: Two times a day (BID) | CUTANEOUS | Status: DC

## 2014-07-06 NOTE — ED Notes (Signed)
Patient observed in bathroom.  Appeared to be scratching herself.  MHT opened door to observe.  Patient is back in her room.  1:1 to be continued for safety.

## 2014-07-06 NOTE — ED Notes (Addendum)
Patient has been sleeping most of the morning.  She has not had any "scratching" episodes.  She will remain 1:1 for safety.
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
## 2014-07-06 NOTE — ED Notes (Signed)
Pt AAO x 3, cooperative, but anxious, no acute distress noted. Continues with destructive behavior, scratches to bilateral arms noted. 1-1 sitter at bedside.  Monitoring for safety, Q 15 min checks in effect.

## 2014-07-07 DIAGNOSIS — R45851 Suicidal ideations: Secondary | ICD-10-CM | POA: Diagnosis not present

## 2014-07-07 DIAGNOSIS — L03114 Cellulitis of left upper limb: Secondary | ICD-10-CM | POA: Diagnosis not present

## 2014-07-07 DIAGNOSIS — F250 Schizoaffective disorder, bipolar type: Secondary | ICD-10-CM | POA: Diagnosis not present

## 2014-07-07 NOTE — ED Notes (Addendum)
1730 and 1739 note charted incorrectly/1730 vitals charted incorrectly.

## 2014-07-07 NOTE — ED Notes (Signed)
Pt d/c with [facility]. All items (four bags) returned.
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
## 2014-07-07 NOTE — BH Assessment (Signed)
[place] Assessment Progress Note  At 09:45 Gae Bon at [facility] reports that pt remains on their wait list.  [name], [place] Triage Specialist [phone]

## 2014-07-07 NOTE — Progress Notes (Signed)
CSW updated patient guardian, [name] of the [employer].   Belia Heman, [place] Work  [employer] [phone]

## 2014-07-07 NOTE — ED Notes (Signed)
Pt requested and reports that she takes colace daily.

## 2014-07-08 DIAGNOSIS — R45851 Suicidal ideations: Secondary | ICD-10-CM | POA: Diagnosis not present

## 2014-07-08 DIAGNOSIS — L03114 Cellulitis of left upper limb: Secondary | ICD-10-CM | POA: Diagnosis not present

## 2014-07-08 DIAGNOSIS — F250 Schizoaffective disorder, bipolar type: Secondary | ICD-10-CM | POA: Diagnosis not present

## 2014-07-08 MED ORDER — BACITRACIN-NEOMYCIN-POLYMYXIN 400-5-5000 EX OINT
1.0000 "application " | TOPICAL_OINTMENT | CUTANEOUS | Status: DC | PRN
  Filled 2014-07-08: qty 1

## 2014-07-08 NOTE — Progress Notes (Signed)
Pt was trying to scratch her left arm and reopened old cuts. Ice pack applied. Vistaril PRN given.

## 2014-07-08 NOTE — ED Notes (Signed)
D: Pt in room resting at this time. Still verbalizing depression with SI. Pt still on 1:1 with sitter at bedside.  A: Pt was offered meds as prescribed. Comfort and encouragement offered.  R: Pt tolerated meds with no appropriately. 1:1 sitter continue to be at the bedside. 15 mins rounds continues.
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
## 2014-07-08 NOTE — Progress Notes (Signed)
Per Amor, pt remains on [facility] waitlist.   Belia Heman, [place] Work  [employer] [phone]

## 2014-07-08 NOTE — B[facility] Counselor (Signed)
Per CSW, Amor at [facility] verified that patient remains on the wait list.  Patient is close to the top of the list.

## 2014-07-08 NOTE — ED Notes (Signed)
Patient lying in bed talking to sitter at bedside. Telling her about her past. Past hyperverbal when awake but cooperative at this time. States that she doesn't feel that nothing has improved since she has been here in the hospital. Reports continued depression and SI today. Patient talking about waiting to go to [facility]. No overt signs of depression. Patient on 1:1 due to unpredictable self harm.

## 2014-07-09 DIAGNOSIS — L03114 Cellulitis of left upper limb: Secondary | ICD-10-CM | POA: Diagnosis not present

## 2014-07-09 DIAGNOSIS — R45851 Suicidal ideations: Secondary | ICD-10-CM | POA: Diagnosis not present

## 2014-07-09 DIAGNOSIS — F250 Schizoaffective disorder, bipolar type: Secondary | ICD-10-CM | POA: Diagnosis not present
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
## 2014-07-09 NOTE — ED Notes (Signed)
Pt. Requesting vistaril for anxiety and insomnia.

## 2014-07-09 NOTE — ED Notes (Signed)
Pt. Noted sleeping in room. No complaints or concerns voiced. No distress or abnormal behavior noted. Will continue to monitor with security cameras. Q 15 minute rounds continue. 

## 2014-07-09 NOTE — ED Notes (Signed)
Pt. Noted in room. No complaints or concerns voiced. No distress or abnormal behavior noted. Will continue to monitor with security cameras. Q 15 minute rounds continue. 

## 2014-07-09 NOTE — Progress Notes (Signed)
Per Robinette pt remains on [facility] waitlist   Belia Heman, [place] Work  [employer] [phone]
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
## 2014-07-10 DIAGNOSIS — R45851 Suicidal ideations: Secondary | ICD-10-CM | POA: Diagnosis not present

## 2014-07-10 DIAGNOSIS — L03114 Cellulitis of left upper limb: Secondary | ICD-10-CM | POA: Diagnosis not present

## 2014-07-10 DIAGNOSIS — F250 Schizoaffective disorder, bipolar type: Secondary | ICD-10-CM | POA: Diagnosis not present

## 2014-07-10 NOTE — ED Notes (Signed)
Pt. Noted in room with sitter at bedside. No complaints or concerns voiced. No distress or abnormal behavior noted. Will continue to monitor with security cameras. Q 15 minute rounds continue.;

## 2014-07-10 NOTE — ED Notes (Signed)
Dr allen into see 

## 2014-07-10 NOTE — ED Notes (Signed)
Pt. Noted sleeping in room. No complaints or concerns voiced. No distress or abnormal behavior noted. Will continue to monitor with security cameras. Q 15 minute rounds continue. 

## 2014-07-10 NOTE — ED Notes (Signed)
Pt. Noted in room sitter ar bedside. No complaints or concerns voiced. No distress or abnormal behavior noted. Will continue to monitor with security cameras. Q 15 minute rounds continue.

## 2014-07-10 NOTE — ED Notes (Signed)
Pt. Noted in rest room. Upon exit from rest room asked for vistaril for anxiety and insomnia. No distress or abnormal behavior noted. Will continue to monitor with security cameras. Q 15 minute rounds continue.

## 2014-07-10 NOTE — ED Notes (Addendum)
Up on the phone.  Pt also c/o pain at the top of her labia, pt reports she has not noted any swelling.

## 2014-07-10 NOTE — ED Notes (Signed)
Pt. Noted in room. No complaints or concerns voiced. No distress or abnormal behavior noted. Will continue to monitor with security cameras. Q 15 minute rounds continue. 
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
## 2014-07-10 NOTE — ED Notes (Signed)
Pt. Noted sleeping in room. No complaints or concerns voiced. No distress or abnormal behavior noted. Will continue to monitor with security cameras. Q 15 minute rounds continue.l

## 2014-07-10 NOTE — ED Notes (Signed)
Report received from Janie Rambo RN. Pt. Sleeping, respirations regular and unlabored. Will continue to monitor for safety via security cameras and Q 15 minute checks. 

## 2014-07-10 NOTE — ED Notes (Signed)
Medicated for HA.

## 2014-07-10 NOTE — ED Provider Notes (Signed)
Called to see patient because of pain in her vaginal area. On exam she complains of discomfort above the clitoris. On physical exam is no evidence of rash. No vesicles. Slight erythema noted. No need for medical intervention at this time  Lacretia Leigh, MD 07/10/14 1038

## 2014-07-10 NOTE — ED Notes (Signed)
Dr Daryl Eastern DNP into see

## 2014-07-10 NOTE — ED Notes (Addendum)
Pt. Noted in room sitter at bedside. No complaints or concerns voiced. No distress or abnormal behavior noted. Will continue to monitor with security cameras. Q 15 minute rounds continue.

## 2014-07-10 NOTE — ED Notes (Addendum)
Pt. Noted sleeping in room sitter at bedside. No complaints or concerns voiced. No distress or abnormal behavior noted. Will continue to monitor with security cameras. Q 15 minute rounds continue.

## 2014-07-10 NOTE — B[facility] Counselor (Signed)
[facility] Assessment Progress Note  64 Counselor faxed IVC to Magistrate. Per [employer], paperwork has been approved and LE has been dispatched.  Kenna Gilbert. Lovena Le, [place], [place] Disposition Counselor

## 2014-07-10 NOTE — ED Notes (Signed)
Time approx Snack given, sitter w/ pt

## 2014-07-11 DIAGNOSIS — R45851 Suicidal ideations: Secondary | ICD-10-CM | POA: Diagnosis not present

## 2014-07-11 DIAGNOSIS — F250 Schizoaffective disorder, bipolar type: Secondary | ICD-10-CM | POA: Diagnosis not present

## 2014-07-11 DIAGNOSIS — L03114 Cellulitis of left upper limb: Secondary | ICD-10-CM | POA: Diagnosis not present

## 2014-07-11 NOTE — ED Notes (Signed)
Pt reports that she is hearing a voice that is telling her to scratch herself "I'm not going to do what is.  Pt reports that she is stressed and that when she  Is stressed her resperidal does not work.  Pt declined visteril. Sitter at bedside. Pt encouraged to use alternate methods to distract herself

## 2014-07-11 NOTE — ED Notes (Signed)
Pt. Noted sleeping in room. No complaints or concerns voiced. No distress or abnormal behavior noted. Will continue to monitor with security cameras. Q 15 minute rounds continue. 

## 2014-07-11 NOTE — ED Notes (Signed)
Pt. Noted sleeping in room with sitter at bedside. No complaints or concerns voiced. No distress or abnormal behavior noted. Will continue to monitor with security cameras. Q 15 minute rounds continue.

## 2014-07-11 NOTE — ED Notes (Signed)
Pt requested visteril for increased anxiety, sitter at bedside, support given.

## 2014-07-11 NOTE — ED Notes (Signed)
Pt. C/o anxiety and insomnia.

## 2014-07-11 NOTE — ED Notes (Signed)
Dr Daryl Eastern DNP into see

## 2014-07-11 NOTE — ED Notes (Signed)
Pt. Noted in rest room. No complaints or concerns voiced. No distress or abnormal behavior noted. Will continue to monitor with security cameras. Q 15 minute rounds continue.  

## 2014-07-11 NOTE — B[facility] Counselor (Signed)
[name] at [facility]  At [phone] who states that patient is still on [facility] wait list.
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
## 2014-07-11 NOTE — ED Notes (Signed)
Up to the bathroom 

## 2014-07-11 NOTE — ED Notes (Signed)
Report received from [facility]. Pt. Sleeping with sitter at bedside, respirations regular and unlabored. Will continue to monitor for safety via security cameras and Q 15 minute checks.

## 2014-07-12 DIAGNOSIS — F250 Schizoaffective disorder, bipolar type: Secondary | ICD-10-CM | POA: Diagnosis not present

## 2014-07-12 DIAGNOSIS — L03114 Cellulitis of left upper limb: Secondary | ICD-10-CM | POA: Diagnosis not present

## 2014-07-12 DIAGNOSIS — R45851 Suicidal ideations: Secondary | ICD-10-CM | POA: Diagnosis not present
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
## 2014-07-12 NOTE — ED Notes (Signed)
Pt. Noted sleeping in room. No complaints or concerns voiced. No distress or abnormal behavior noted. Will continue to monitor with security cameras. Q 15 minute rounds continue. 

## 2014-07-12 NOTE — ED Notes (Signed)
Pt is in no distress.  Denies SI an HI today but complains of audio hallucinations.  Cannot tell what she hears.  1:1 remains in place and pt remains safe.  No pain at this time , states ":my tooth feels better this afternoon"

## 2014-07-12 NOTE — ED Notes (Signed)
Pt. Noted sleeping in room with sitter at bedside. No complaints or concerns voiced. No distress or abnormal behavior noted. Will continue to monitor with security cameras. Q 15 minute rounds continue.

## 2014-07-12 NOTE — ED Notes (Signed)
Pt. C/o anxiety. 

## 2014-07-12 NOTE — ED Notes (Signed)
Pt. Noted in rest room. No complaints or concerns voiced. No distress or abnormal behavior noted. Will continue to monitor with security cameras. Q 15 minute rounds continue.  

## 2014-07-12 NOTE — ED Notes (Signed)
Pt AAO x 3, no distress noted, 1-1 sitter at bedside, remains passive SI, arms healing well.  Monitoring for safety, Q 15 min checks in effect.

## 2014-07-13 DIAGNOSIS — R45851 Suicidal ideations: Secondary | ICD-10-CM | POA: Diagnosis not present

## 2014-07-13 DIAGNOSIS — L03114 Cellulitis of left upper limb: Secondary | ICD-10-CM | POA: Diagnosis not present

## 2014-07-13 DIAGNOSIS — F250 Schizoaffective disorder, bipolar type: Secondary | ICD-10-CM | POA: Diagnosis not present

## 2014-07-13 NOTE — ED Notes (Signed)
Pt continues to complain of menstrual cramps

## 2014-07-13 NOTE — ED Notes (Signed)
Pt.less tearful.  Writing about her feelings and more talkative.  Not actively trying to scratch her arms.

## 2014-07-13 NOTE — ED Notes (Signed)
Patient appears flat, bored. Complains of cramping and anxiety. Reports thoughts of self harm.   Encouragement offered. Given Motrin and Vistaril.   Remains on one to one. Q 15 safety checks continue.

## 2014-07-13 NOTE — ED Notes (Signed)
Nurse called to room because sitter noticed pt. Scratching.  Noticed pt. Was crying and scratching and saying she should have cut her throat at the group home.  Tried multiple distractions, encouraged pt to write or talk about feelings.  Medicated as ordered.

## 2014-07-13 NOTE — BH Assessment (Signed)
[place] Assessment Progress Note  At 08:40 [name] reports that pt remains on the [facility] wait list.  Evelyn Mullet, MA Triage Specialist [phone]

## 2014-07-13 NOTE — ED Notes (Signed)
Pt. Alert and oriented x 3.  Denies HI, SI, AVH.  Sitter at bedside.  Pt. Talkative and smiling this am.  Complains of menstrual cramps, medicated per md order for that.

## 2014-07-13 NOTE — Progress Notes (Signed)
CSW spoke with pt guardian, [name] of [place] [facility] [place] regarding pt and disposition. Per guardian, care coordinator is working on a placement in [place], [place]. [place] called care coordinator, [name] ,[phone]. Per karen, they are working on trying to get placement for pt at [facility]. Per [name] is a high acuity placement. Per care coordinator, an apartment could be available, however it is up to gaurdian to determine safe discharge plan for patient. CSW, care coordinator, and guardian to continue to seek placement for patient.   [name], [place] Work  [employer] [phone]
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
## 2014-07-14 DIAGNOSIS — R45851 Suicidal ideations: Secondary | ICD-10-CM | POA: Diagnosis not present

## 2014-07-14 DIAGNOSIS — F250 Schizoaffective disorder, bipolar type: Secondary | ICD-10-CM | POA: Diagnosis not present

## 2014-07-14 DIAGNOSIS — L03114 Cellulitis of left upper limb: Secondary | ICD-10-CM | POA: Diagnosis not present
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
## 2014-07-14 NOTE — ED Notes (Signed)
Patient up to bathroom.  Asked if she would take a shower.  She proceeded to bathroom with washcloth and is washing herself at the sink.  Patient has not taken a full shower since her admission.  She requested her own soap, lotion, shampoo and conditioner.  MD advised her to use the products we have on hand here.

## 2014-07-14 NOTE — Progress Notes (Addendum)
CSW spoke with Sonia Baller from [facility], (readmission [facility]) [phone] who states that patient is still on waitlist however guardian and care coordinator are looking into a supportive apartment and [address]. CSW awaiting return call from guardian and care coordinator regarding status of supportive apartment. At  This time pt remains SI however seen laughing in the hallway talking on the phone. Pt states, "I like it here."   Belia Heman, [employer] Work  [employer] [phone]
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
## 2014-07-14 NOTE — ED Notes (Signed)
C/o nausea, abdominal pain, constipation, cramping.

## 2014-07-14 NOTE — ED Notes (Signed)
D:Pt reports that she may go to a group home following discharge instead of [place]. Pt is mildly anxious and has a sitter with her.  A:Offered support, encouragement and 15 minute checks. R:Safety maintained in the [facility].

## 2014-07-15 DIAGNOSIS — F250 Schizoaffective disorder, bipolar type: Secondary | ICD-10-CM | POA: Diagnosis not present

## 2014-07-15 DIAGNOSIS — L03114 Cellulitis of left upper limb: Secondary | ICD-10-CM | POA: Diagnosis not present

## 2014-07-15 NOTE — Progress Notes (Addendum)
Pt remains on [place] wait list per Robinette.   [name], Alice Work  [employer] [phone]

## 2014-07-15 NOTE — ED Notes (Signed)
Patient is currently sleeping, vitals signs will be taken at a later time.
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
## 2014-07-15 NOTE — ED Notes (Signed)
Patient alert. Drowsy, flat. Reports anxiety.  Encouragement offered.  Q 15 safety checks continue. One to one continues.

## 2014-07-15 NOTE — ED Notes (Signed)
Pt. Resting most of the afternoon.  1:1 at bedside. 15 minute checks remain in place.  No distress noted

## 2014-07-16 DIAGNOSIS — F250 Schizoaffective disorder, bipolar type: Secondary | ICD-10-CM | POA: Diagnosis not present

## 2014-07-16 DIAGNOSIS — L03114 Cellulitis of left upper limb: Secondary | ICD-10-CM | POA: Diagnosis not present

## 2014-07-16 NOTE — ED Notes (Signed)
Report received from [facility]. Pt. Alert and oriented in no distress denies SI, HI and AVH.  Pt. Instructed to come to me with problems or concerns.Will continue to monitor for safety via security cameras and Q 15 minute checks.

## 2014-07-16 NOTE — ED Notes (Signed)
Patient's 1:1 on trial discontinuation. Monitoring patient closely. Will continue to monitor.  Zonia Caplin, Thornton Dales, RN

## 2014-07-16 NOTE — BH Assessment (Signed)
[facility] Assessment Progress Note  Per Robinette at 09:15, pt remains on [facility] wait list.  [name], MA Triage Specialist [phone]

## 2014-07-16 NOTE — ED Notes (Signed)
Pt. Noted sleeping in room. No complaints or concerns voiced. No distress or abnormal behavior noted. Will continue to monitor with security cameras. Q 15 minute rounds continue. 
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
## 2014-07-16 NOTE — ED Notes (Addendum)
Pt. Noted in room. No complaints or concerns voiced. No distress or abnormal behavior noted. Will continue to monitor with security cameras. Q 15 minute rounds continue. 

## 2014-07-16 NOTE — ED Notes (Signed)
Patient is A&Ox4, denies SI/HI A/V hallucinations, sitter at bedside. Patient requesting to have 1:1 discontinued because she isn't having any thoughts of hurting herself.   Ryin Schillo, Thornton Dales, RN

## 2014-07-17 DIAGNOSIS — F250 Schizoaffective disorder, bipolar type: Secondary | ICD-10-CM | POA: Diagnosis not present

## 2014-07-17 DIAGNOSIS — L03114 Cellulitis of left upper limb: Secondary | ICD-10-CM | POA: Diagnosis not present

## 2014-07-17 NOTE — ED Notes (Addendum)
Patient assessed, patient resting comfortably at this time.  Stretcher in lowest position, Call bell within reach of patient. Will continue to monitor accordingly with security cameras and q 15 min monitoring.

## 2014-07-17 NOTE — ED Notes (Signed)
Pt. Noted sleeping in room. No complaints or concerns voiced. No distress or abnormal behavior noted. Will continue to monitor with security cameras. Q 15 minute rounds continue. 

## 2014-07-17 NOTE — ED Notes (Signed)
Patient is resting at this time, Patient has no complaints nor distress at this time. Call bell within reach, stretcher in lowest position, security cameras and q 15 min checks are monitoring patient. Will continue to monitor at this time.

## 2014-07-17 NOTE — ED Notes (Signed)
Reading magazinese in room

## 2014-07-17 NOTE — ED Notes (Signed)
Pt. Noted in room. Pt. C/o some anxiety. No distress or abnormal behavior noted. Will continue to monitor with security cameras. Q 15 minute rounds continue.

## 2014-07-17 NOTE — ED Notes (Signed)
Patient assessed, watching TV on stretcher.  Stretcher in lowest position, Call bell within reach of patient. Will continue to monitor accordingly with security cameras and q 15 min monitoring.

## 2014-07-17 NOTE — ED Notes (Signed)
Pt. Noted in room. No complaints or concerns voiced. No distress or abnormal behavior noted. Will continue to monitor with security cameras. Q 15 minute rounds continue. Saltine crackers and ice water given at patients request.

## 2014-07-17 NOTE — ED Notes (Signed)
Pt. Noted in room. No complaints or concerns voiced. No distress or abnormal behavior noted. Will continue to monitor with security cameras. Q 15 minute rounds continue. 

## 2014-07-17 NOTE — ED Notes (Signed)
Patient assessed, patient resting comfortably at this time.  Stretcher in lowest position, Call bell within reach of patient. Will continue to monitor accordingly with security cameras and q 15 min monitoring.

## 2014-07-17 NOTE — ED Notes (Signed)
Patient assessed, patient resting on stretcher at this time. Patient alert and oriented.Patient stated that she feels "sad" from being here. Reminded patient of accomplishments since being here. Patient denies SI, HI, AVH. Stretcher in lowest position, call bell within reach, patient monitored q 15 min checks and security cameras. Will continue to monitor.

## 2014-07-17 NOTE — ED Notes (Addendum)
Patient assessed, patient on phone, sitting on floor by nurses' station. Stretcher in lowest position, Call bell within reach of patient. Will continue to monitor accordingly with security cameras and q 15 min monitoring.

## 2014-07-17 NOTE — ED Notes (Signed)
Patient alert and oriented, watching TV at this time.  Patient denies SI, HI, AVH. Stretcher in lowest position, Call bell within reach of patient. Will continue to monitor accordingly with security cameras and q 15 min monitoring.
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
## 2014-07-17 NOTE — ED Notes (Signed)
Up on the phone 

## 2014-07-17 NOTE — ED Notes (Addendum)
Patient assessed, patient watching TV on stretcher. Patient alert and oriented. Patient denies SI, HI, AVH. Stretcher in lowest position, call bell within reach, patient monitored q 15 min checks and security cameras. Will continue to monitor.

## 2014-07-17 NOTE — ED Notes (Signed)
Report received from Janie Rambo RN. Pt. Alert and oriented in no distress denies SI, HI, AVH and pain.  Pt. Instructed to come to me with problems or concerns.Will continue to monitor for safety via security cameras and Q 15 minute checks. 

## 2014-07-17 NOTE — ED Notes (Signed)
Patient assessed, noted to be alert and oriented. Patient watching TV at this time. Given ginger ale and docusate sodium capsule PO. Patient denies SI, HI, and AVH. Stretcher in lowest position, Call bell within reach of patient. Will continue to monitor accordingly with security cameras and q 15 min monitoring.

## 2014-07-17 NOTE — ED Notes (Signed)
Pt. C/o mild nausea.

## 2014-07-18 DIAGNOSIS — L03114 Cellulitis of left upper limb: Secondary | ICD-10-CM | POA: Diagnosis not present

## 2014-07-18 DIAGNOSIS — F250 Schizoaffective disorder, bipolar type: Secondary | ICD-10-CM | POA: Diagnosis not present

## 2014-07-18 NOTE — ED Notes (Signed)
Pt. C/o anxiety. 

## 2014-07-18 NOTE — ED Notes (Signed)
Up tot he bathroom to shower and change scrubs 

## 2014-07-18 NOTE — ED Notes (Signed)
Pt. Noted sleeping in room. No complaints or concerns voiced. No distress or abnormal behavior noted. Will continue to monitor with security cameras. Q 15 minute rounds continue. 

## 2014-07-18 NOTE — ED Notes (Signed)
Pt. Noted in room. No complaints or concerns voiced. No distress or abnormal behavior noted. Will continue to monitor with security cameras. Q 15 minute rounds continue. 

## 2014-07-18 NOTE — ED Notes (Signed)
Up to the bathroom 
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
## 2014-07-18 NOTE — ED Notes (Signed)
Dr Harrington Challenger and josephine NP into see

## 2014-07-18 NOTE — ED Notes (Signed)
Report received from Janie Rambo RN. Pt. Sleeping, respirations regular and unlabored. Will continue to monitor for safety via security cameras and Q 15 minute checks. 

## 2014-07-19 DIAGNOSIS — R45851 Suicidal ideations: Secondary | ICD-10-CM | POA: Diagnosis not present

## 2014-07-19 DIAGNOSIS — F250 Schizoaffective disorder, bipolar type: Secondary | ICD-10-CM | POA: Diagnosis not present

## 2014-07-19 DIAGNOSIS — L03114 Cellulitis of left upper limb: Secondary | ICD-10-CM | POA: Diagnosis not present

## 2014-07-19 NOTE — ED Notes (Signed)
Pt sleeping at present, no distress noted, calm & cooperative, monitoring for safety, Q 15 min checks in effect. 

## 2014-07-19 NOTE — ED Notes (Signed)
Pt. Noted sleeping in room. No complaints or concerns voiced. No distress or abnormal behavior noted. Will continue to monitor with security cameras. Q 15 minute rounds continue. 

## 2014-07-19 NOTE — Progress Notes (Signed)
CSW spoke with care cooridnator karen. Per Santiago Glad they are still working on placements closer to pt fiance in [place], so if patient is able to stabilize before bed available at [facility], or after inpatient stabilization at [facility].   Belia Heman, [place] Work  [employer] [phone]

## 2014-07-19 NOTE — BH Assessment (Signed)
[place] Assessment Progress Note  At 08:54 Marlowe Kays from [facility] calls to report that this pt remains on their wait list.  [name], [place] Triage Specialist [phone]

## 2014-07-19 NOTE — Progress Notes (Signed)
Pt remains on [place] waist list.  CSW called patient guardian Rudy Jew [place] [phone]) and unable to leave a message.  CSW called pt care coordinator Santiago Glad [phone] and left message.   Belia Heman, [place] Work  [employer] [phone]
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
## 2014-07-19 NOTE — ED Notes (Signed)
Patient upset because she called her Guardian and they were unable to speak with her at that time.  Patient tearful, sobbing and stating, "I don't want to go to [facility].  They are going to change my medication all around!"  Patient stated, "I feel like banging my head."  Attempting to keep patient calm.

## 2014-07-20 DIAGNOSIS — L03114 Cellulitis of left upper limb: Secondary | ICD-10-CM | POA: Diagnosis not present

## 2014-07-20 DIAGNOSIS — F250 Schizoaffective disorder, bipolar type: Secondary | ICD-10-CM | POA: Diagnosis not present

## 2014-07-20 NOTE — Progress Notes (Signed)
[name] of [facility] reached out to CSW requesting clinicals to review to possible pt admission.  CSW faxed the requested documents to [facility].  CSW will continue to follow up with [facility] regarding pt.  Willette Brace [phone] ED CSW 07/20/2014 5:33 PM

## 2014-07-20 NOTE — ED Notes (Signed)
Pt AAO x 3, calm & anxious at present, med given.  No distress noted, cooperative, monitoring for safety, Q 15 min checks in effect.

## 2014-07-20 NOTE — ED Notes (Addendum)
Patient did not have a good day.  She spoke on the phone several times.  After her conversations, she would cry and sit in the floor.  She had to be redirected to her room several times.  Phone privileges had to be suspended due her emotional state.  She threatened to bang her head against the wall.  She requested another nurse and was informed that would not happen.

## 2014-07-20 NOTE — ED Notes (Signed)
Pt taken off unit briefly accompanied by Elenor Quinones and RN Jinny Blossom, Security accompanied for assistance.  Pt tolerated well.  No distress noted. Pt calm & cooperative.
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
## 2014-07-21 DIAGNOSIS — L03114 Cellulitis of left upper limb: Secondary | ICD-10-CM | POA: Diagnosis not present

## 2014-07-21 DIAGNOSIS — F331 Major depressive disorder, recurrent, moderate: Secondary | ICD-10-CM | POA: Diagnosis not present

## 2014-07-21 DIAGNOSIS — F329 Major depressive disorder, single episode, unspecified: Secondary | ICD-10-CM | POA: Diagnosis not present

## 2014-07-21 DIAGNOSIS — R609 Edema, unspecified: Secondary | ICD-10-CM | POA: Diagnosis not present

## 2014-07-21 DIAGNOSIS — F603 Borderline personality disorder: Secondary | ICD-10-CM | POA: Diagnosis not present

## 2014-07-21 DIAGNOSIS — M7989 Other specified soft tissue disorders: Secondary | ICD-10-CM | POA: Diagnosis not present

## 2014-07-21 DIAGNOSIS — D539 Nutritional anemia, unspecified: Secondary | ICD-10-CM | POA: Diagnosis not present

## 2014-07-21 DIAGNOSIS — Z599 Problem related to housing and economic circumstances, unspecified: Secondary | ICD-10-CM | POA: Diagnosis not present

## 2014-07-21 DIAGNOSIS — R4183 Borderline intellectual functioning: Secondary | ICD-10-CM | POA: Diagnosis not present

## 2014-07-21 DIAGNOSIS — Z644 Discord with counselors: Secondary | ICD-10-CM | POA: Diagnosis not present

## 2014-07-21 DIAGNOSIS — E538 Deficiency of other specified B group vitamins: Secondary | ICD-10-CM | POA: Diagnosis not present

## 2014-07-21 DIAGNOSIS — E559 Vitamin D deficiency, unspecified: Secondary | ICD-10-CM | POA: Diagnosis not present

## 2014-07-21 DIAGNOSIS — Z608 Other problems related to social environment: Secondary | ICD-10-CM | POA: Diagnosis not present

## 2014-07-21 DIAGNOSIS — R52 Pain, unspecified: Secondary | ICD-10-CM | POA: Diagnosis not present

## 2014-07-21 DIAGNOSIS — Z639 Problem related to primary support group, unspecified: Secondary | ICD-10-CM | POA: Diagnosis not present

## 2014-07-21 DIAGNOSIS — K219 Gastro-esophageal reflux disease without esophagitis: Secondary | ICD-10-CM | POA: Diagnosis not present

## 2014-07-21 DIAGNOSIS — J45909 Unspecified asthma, uncomplicated: Secondary | ICD-10-CM | POA: Diagnosis not present

## 2014-07-21 DIAGNOSIS — F250 Schizoaffective disorder, bipolar type: Secondary | ICD-10-CM | POA: Diagnosis not present

## 2014-07-21 DIAGNOSIS — F840 Autistic disorder: Secondary | ICD-10-CM | POA: Diagnosis not present

## 2014-07-21 DIAGNOSIS — Q992 Fragile X chromosome: Secondary | ICD-10-CM | POA: Diagnosis not present

## 2014-07-21 DIAGNOSIS — Z9119 Patient's noncompliance with other medical treatment and regimen: Secondary | ICD-10-CM | POA: Diagnosis not present

## 2014-07-21 NOTE — ED Notes (Addendum)
Pt is pleasant this am. He was seen to be cuddling with her stuffed animal . Pt denies SI and HI and contracts for safety. Pt is pleasant this am.She stated she would not hurt herself. Pt did request motrin for stomach pain. Pt was given motrin for head and stomach pain. Spoke to [place] at [facility]. Report given on the pt who will be transported this evening. Pt stated at 11am-her headache and stomachache are better. Pt will be transported shortly to [facility]. At this time she is stable.
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
## 2014-07-21 NOTE — BH Assessment (Addendum)
[facility] Assessment Progress Note  At 11:26 [name] at [facility] reports that this pt has been accepted to their facility.  [name], MD concurs with this decision.  Pt's nurse, [name], has been notified.  Pt is to be transported via [name].  [name], MA Triage Specialist [phone]

## 2014-11-23 DIAGNOSIS — M7989 Other specified soft tissue disorders: Secondary | ICD-10-CM | POA: Diagnosis not present

## 2014-11-23 DIAGNOSIS — R609 Edema, unspecified: Secondary | ICD-10-CM | POA: Diagnosis not present

## 2014-11-23 DIAGNOSIS — R52 Pain, unspecified: Secondary | ICD-10-CM | POA: Diagnosis not present

## 2015-01-11 ENCOUNTER — Emergency Department (HOSPITAL_COMMUNITY)
Admission: EM | Admit: 2015-01-11 | Discharge: 2015-01-11 | Disposition: A | Discharge status: Home / Self Care | Payer: Medicare Other | Attending: Emergency Medicine | Admitting: Emergency Medicine

## 2015-01-11 ENCOUNTER — Encounter (HOSPITAL_COMMUNITY): Payer: Self-pay

## 2015-01-11 DIAGNOSIS — F419 Anxiety disorder, unspecified: Secondary | ICD-10-CM | POA: Diagnosis not present

## 2015-01-11 DIAGNOSIS — F329 Major depressive disorder, single episode, unspecified: Secondary | ICD-10-CM | POA: Insufficient documentation

## 2015-01-11 DIAGNOSIS — J45909 Unspecified asthma, uncomplicated: Secondary | ICD-10-CM | POA: Insufficient documentation

## 2015-01-11 DIAGNOSIS — Z8669 Personal history of other diseases of the nervous system and sense organs: Secondary | ICD-10-CM | POA: Insufficient documentation

## 2015-01-11 DIAGNOSIS — Z79899 Other long term (current) drug therapy: Secondary | ICD-10-CM | POA: Insufficient documentation

## 2015-01-11 DIAGNOSIS — F250 Schizoaffective disorder, bipolar type: Secondary | ICD-10-CM | POA: Diagnosis not present

## 2015-01-11 DIAGNOSIS — F32A Depression, unspecified: Secondary | ICD-10-CM

## 2015-01-11 DIAGNOSIS — F840 Autistic disorder: Secondary | ICD-10-CM | POA: Insufficient documentation

## 2015-01-11 DIAGNOSIS — K219 Gastro-esophageal reflux disease without esophagitis: Secondary | ICD-10-CM | POA: Insufficient documentation

## 2015-01-11 NOTE — Discharge Instructions (Signed)
Suicidal Feelings: How to Help Yourself °Suicide is the taking of one's own life. If you feel as though life is getting too tough to handle and are thinking about suicide, get help right away. To get help: °· Call your local emergency services (911 in the [place] °· Call a suicide hotline to speak with a trained counselor who understands how you are feeling. The following is a list of suicide hotlines in the [place]. For a list of hotlines in [place], visit www.suicide.org/hotlines/international/[place]-suicide-hotlines.html. °¨  1-800-273-TALK (1-800-273-8255). °¨  1-800-SUICIDE (1-800-784-2433). °¨  1-888-628-9454. This is a hotline for Spanish speakers. °¨  1-800-799-4TTY (1-800-799-4889). This is a hotline for TTY users. °¨  1-866-4-U-TREVOR (1-866-488-7386). This is a hotline for lesbian, gay, bisexual, transgender, or questioning youth. °· Contact a crisis center or a local suicide prevention center. To find a crisis center or suicide prevention center: °¨ Call your local hospital, clinic, community service organization, mental health center, social service provider, or health department. Ask for assistance in connecting to a crisis center. °¨ Visit www.suicidepreventionlifeline.org/getinvolved/locator for a list of crisis centers in the [place], or visit www.suicideprevention.ca/thinking-about-suicide/find-a-crisis-centre for a list of centers in [place]. °· Visit the following websites: °¨  National Suicide Prevention Lifeline: www.suicidepreventionlifeline.org °¨  Hopeline: www.hopeline.com °¨  American Foundation for Suicide Prevention: www.afsp.org °¨  The Trevor Project (for lesbian, gay, bisexual, transgender, or questioning youth): www.thetrevorproject.org °HOW CAN I HELP MYSELF FEEL BETTER? °· Promise yourself that you will not do anything drastic when you have suicidal feelings. Remember, there is hope. Many people have gotten through suicidal thoughts and feelings, and you will, too. You may  have gotten through them before, and this proves that you can get through them again. °· Let family, friends, teachers, or counselors know how you are feeling. Try not to isolate yourself from those who care about you. Remember, they will want to help you. Talk with someone every day, even if you do not feel sociable. Face-to-face conversation is best. °· Call a mental health professional and see one regularly. °· Visit your primary health care provider every year. °· Eat a well-balanced diet, and space your meals so you eat regularly. °· Get plenty of rest. °· Avoid alcohol and drugs, and remove them from your home. They will only make you feel worse. °· If you are thinking of taking a lot of medicine, give your medicine to someone who can give it to you one day at a time. If you are on antidepressants and are concerned you will overdose, let your health care provider know so he or she can give you safer medicines. Ask your mental health professional about the possible side effects of any medicines you are taking. °· Remove weapons, poisons, knives, and anything else that could harm you from your home. °· Try to stick to routines. Follow a schedule every day. Put self-care on your schedule. °· Make a list of realistic goals, and cross them off when you achieve them. Accomplishments give a sense of worth. °· Wait until you are feeling better before doing the things you find difficult or unpleasant. °· Exercise if you are able. You will feel better if you exercise for even a half hour each day. °· Go out in the sun or into nature. This will help you recover from depression faster. If you have a favorite place to walk, go there. °· Do the things that have always given you pleasure. Play your favorite music, read a good book, paint a picture, play your favorite instrument, or do anything   else that takes your mind off your depression if it is safe to do. °· Keep your living space well lit. °· When you are feeling well,  write yourself a letter about tips and support that you can read when you are not feeling well. °· Remember that life's difficulties can be sorted out with help. Conditions can be treated. You can work on thoughts and strategies that serve you well. °  °This information is not intended to replace advice given to you by your health care provider. Make sure you discuss any questions you have with your health care provider. °  °Document Released: 07/29/2002 Document Revised: 02/12/2014 Document Reviewed: 05/19/2013 °Elsevier Interactive Patient Education ©2016 [employer] ° °

## 2015-01-11 NOTE — BH Assessment (Signed)
Writer consulted with Dr, Tomi Bamberger and he reports that the patient told him the she was going to follow up with her provider at [facility]. Writer informed Dr, Tomi Bamberger that if the patient returns that TTS will be able to assess the patient.

## 2015-01-11 NOTE — ED Notes (Signed)
Pt decided to not stay.  Denied SI once again prior to discharge.  MD made aware pt not willing to stay.

## 2015-01-11 NOTE — ED Provider Notes (Signed)
CSN: [id number]     Arrival date & time 01/11/15  [id number] History   First MD Initiated Contact with Patient 01/11/15 1003     Chief Complaint  Patient presents with  . Anxiety  . Depression   HPI The patient presents to the emergency room with complaints of depression.  Patient has history of depression, schizoaffective disorder, and autism. She was in [facility] for approximately 6 months.  Patient states she was in a group home and told them that she was going to kill herself if they did not let her leave. Patient states she was discharged from the psychiatric hospital approximately 1-2 weeks ago. This past weekend her boyfriend who was also at [facility] was with her on a weekend pass. He had to go back to the psychiatric facility. Patient states she has become depressed and is anxious. She feels like she needs to have her medications increased. She did have some thoughts of suicidal ideation were fleeting yesterday but denies any suicidal thoughts today. She does not want to harm herself. The patient does not want to go to [facility]. She she does not want to be in the emergency room right now. Patient is followed by the act team at [facility]. She did not speak with them today. Past Medical History  Diagnosis Date  . Asthma   . Depression   . Schizoaffective disorder, bipolar type ([place])   . Autism   . Essential tremor   . Chronic constipation   . Acid reflux    Past Surgical History  Procedure Laterality Date  . Mouth surgery     Family History  Problem Relation Age of Onset  . Mental illness Father   . PDD Brother   . Seizures Brother    Social History  Substance Use Topics  . Smoking status: Never Smoker   . Smokeless tobacco: None  . Alcohol Use: No   OB History    No data available     Review of Systems  All other systems reviewed and are negative.     Allergies  Coconut flavor; Geodon; Haloperidol and related; Lithium;  Oxycodone; Prilosec; Seroquel; and Thorazine  Home Medications   Prior to Admission medications   Medication Sig Start Date End Date Taking? Authorizing Provider  benztropine (COGENTIN) 1 MG tablet Take 1 tablet (1 mg total) by mouth 2 (two) times daily as needed for tremors. Patient not taking: Reported on 06/22/2014 03/29/14   Niel Hummer, NP  carbamazepine (TEGRETOL XR) 200 MG 12 hr tablet Take 1 tablet (200 mg total) by mouth 2 (two) times daily. Patient not taking: Reported on 06/22/2014 03/29/14   Niel Hummer, NP  cholecalciferol (VITAMIN D) 1000 UNITS tablet Take 1,000 Units by mouth daily.    Historical Provider, MD  clonazePAM (KLONOPIN) 0.5 MG tablet Take 0.5 tablets (0.25 mg total) by mouth 2 (two) times daily as needed (agitation). Patient not taking: Reported on 06/22/2014 03/29/14   Niel Hummer, NP  clonazePAM (KLONOPIN) 1 MG tablet Take 1 tablet (1 mg total) by mouth at bedtime. 03/29/14   Niel Hummer, NP  docusate sodium (COLACE) 100 MG capsule Take 1 capsule (100 mg total) by mouth at bedtime. Patient taking differently: Take 100 mg by mouth every morning.  03/29/14   Niel Hummer, NP  escitalopram (LEXAPRO) 10 MG tablet Take 10 mg by mouth daily.    Historical Provider, MD  escitalopram (LEXAPRO) 20 MG tablet Take  1 tablet (20 mg total) by mouth daily. Patient not taking: Reported on 06/22/2014 03/29/14   Niel Hummer, NP  ferrous sulfate 325 (65 FE) MG tablet Take 1 tablet (325 mg total) by mouth daily with supper. Patient not taking: Reported on 06/22/2014 03/29/14   Niel Hummer, NP  hydrOXYzine (VISTARIL) 25 MG capsule Take 25 mg by mouth 3 (three) times daily as needed (for anxiety).    Historical Provider, MD  lamoTRIgine (LAMICTAL) 25 MG tablet Take 2 tablets (50 mg total) by mouth daily. Patient not taking: Reported on 06/22/2014 03/29/14   Niel Hummer, NP  metoprolol tartrate (LOPRESSOR) 25 MG tablet Take 1 tablet (25 mg total) by mouth 2 (two) times daily. Patient  not taking: Reported on 06/22/2014 03/29/14   Niel Hummer, NP  pantoprazole (PROTONIX) 40 MG tablet Take 1 tablet (40 mg total) by mouth daily. 03/29/14   Niel Hummer, NP  prazosin (MINIPRESS) 1 MG capsule Take 1 capsule (1 mg total) by mouth at bedtime. Patient not taking: Reported on 06/22/2014 03/29/14   Niel Hummer, NP  prazosin (MINIPRESS) 2 MG capsule Take 2 mg by mouth at bedtime.    Historical Provider, MD  risperiDONE (RISPERDAL M-TABS) 3 MG disintegrating tablet Take 1 tablet (3 mg total) by mouth at bedtime. Patient not taking: Reported on 06/22/2014 03/29/14   Niel Hummer, NP  risperiDONE (RISPERDAL) 0.5 MG tablet Take 1 tablet (0.5 mg total) by mouth every morning. Patient not taking: Reported on 06/22/2014 03/29/14   Niel Hummer, NP  risperiDONE (RISPERDAL) 2 MG tablet Take 2 mg by mouth 2 (two) times daily.    Historical Provider, MD  senna (SENOKOT) 8.6 MG TABS tablet Take 2 tablets by mouth at bedtime.    Historical Provider, MD  traZODone (DESYREL) 50 MG tablet Take 1 tablet (50 mg total) by mouth at bedtime as needed for sleep. Patient not taking: Reported on 03/16/2014 01/09/14   Benjamine Mola, FNP   BP 132/84 mmHg  Pulse 100  Temp(Src) 98 F (36.7 C) (Oral)  Resp 18  SpO2 100%  LMP 12/28/2014 Physical Exam  Constitutional: She appears well-developed and well-nourished. No distress.  HENT:  Head: Normocephalic and atraumatic.  Right Ear: External ear normal.  Left Ear: External ear normal.  Eyes: Conjunctivae are normal. Right eye exhibits no discharge. Left eye exhibits no discharge. No scleral icterus.  Neck: Neck supple. No tracheal deviation present.  Cardiovascular: Normal rate, regular rhythm and intact distal pulses.   Pulmonary/Chest: Effort normal and breath sounds normal. No stridor. No respiratory distress. She has no wheezes. She has no rales.  Abdominal: Soft. Bowel sounds are normal. She exhibits no distension. There is no tenderness. There is no  rebound and no guarding.  Musculoskeletal: She exhibits no edema or tenderness.  Neurological: She is alert. She has normal strength. No cranial nerve deficit (no facial droop, extraocular movements intact, no slurred speech) or sensory deficit. She exhibits normal muscle tone. She displays no seizure activity. Coordination normal.  Skin: Skin is warm and dry. No rash noted.  Psychiatric: She exhibits a depressed mood. She expresses no homicidal and no suicidal ideation. She expresses no suicidal plans and no homicidal plans.  Nursing note and vitals reviewed.   ED Course  Procedures (including critical care time)   MDM   Final diagnoses:  Depression    Patient is complaining of recurrent depression. She denies to me that she is suicidal and  has no intent to harm herself. She feels like her medications need to be increased.  I suggested the patient see one of the behavioral health specialist here in the emergency department.  The patient does not want to be here in the emergency room now. She has changed her mind. She wants to go to [facility] to see one of her act specialists.  Patient denies suicidal or homicidal ideation. I do not feel that she has any active intent to harm herself.  I do not feel she meets IVC at this time.  1018  prior to being discharged, the patient decided that she will stay and be evaluated here. I have placed medical screening labs and will consult with TTS.    Dorie Rank, MD 01/11/15 1018

## 2015-01-11 NOTE — ED Notes (Signed)
Pt checked into ED as stating "suicidal".  Pt assessment in triage, denies.  Pt states she just left [facility].  Boyfriend left with her on a weekend pass.  She is depressed and anxious b/c he had to go back and she is in the home alone.  States she needs her meds increased.

## 2015-01-13 DIAGNOSIS — F250 Schizoaffective disorder, bipolar type: Secondary | ICD-10-CM | POA: Diagnosis not present

## 2015-01-16 ENCOUNTER — Emergency Department (HOSPITAL_COMMUNITY): Payer: Medicare Other

## 2015-01-16 ENCOUNTER — Encounter (HOSPITAL_COMMUNITY): Payer: Self-pay | Admitting: Emergency Medicine

## 2015-01-16 ENCOUNTER — Emergency Department (HOSPITAL_COMMUNITY)
Admission: EM | Admit: 2015-01-16 | Discharge: 2015-01-17 | Disposition: A | Discharge status: Home / Self Care | Payer: Medicare Other | Attending: Emergency Medicine | Admitting: Emergency Medicine

## 2015-01-16 DIAGNOSIS — F603 Borderline personality disorder: Secondary | ICD-10-CM | POA: Diagnosis not present

## 2015-01-16 DIAGNOSIS — R42 Dizziness and giddiness: Secondary | ICD-10-CM | POA: Insufficient documentation

## 2015-01-16 DIAGNOSIS — Z8669 Personal history of other diseases of the nervous system and sense organs: Secondary | ICD-10-CM | POA: Insufficient documentation

## 2015-01-16 DIAGNOSIS — R200 Anesthesia of skin: Secondary | ICD-10-CM | POA: Diagnosis not present

## 2015-01-16 DIAGNOSIS — K5900 Constipation, unspecified: Secondary | ICD-10-CM | POA: Insufficient documentation

## 2015-01-16 DIAGNOSIS — R110 Nausea: Secondary | ICD-10-CM | POA: Insufficient documentation

## 2015-01-16 DIAGNOSIS — R0789 Other chest pain: Secondary | ICD-10-CM | POA: Diagnosis not present

## 2015-01-16 DIAGNOSIS — F840 Autistic disorder: Secondary | ICD-10-CM | POA: Diagnosis not present

## 2015-01-16 DIAGNOSIS — F250 Schizoaffective disorder, bipolar type: Secondary | ICD-10-CM | POA: Insufficient documentation

## 2015-01-16 DIAGNOSIS — R45851 Suicidal ideations: Secondary | ICD-10-CM | POA: Diagnosis not present

## 2015-01-16 DIAGNOSIS — Z86718 Personal history of other venous thrombosis and embolism: Secondary | ICD-10-CM | POA: Insufficient documentation

## 2015-01-16 DIAGNOSIS — R079 Chest pain, unspecified: Secondary | ICD-10-CM | POA: Diagnosis not present

## 2015-01-16 DIAGNOSIS — J40 Bronchitis, not specified as acute or chronic: Secondary | ICD-10-CM | POA: Diagnosis not present

## 2015-01-16 DIAGNOSIS — Z23 Encounter for immunization: Secondary | ICD-10-CM | POA: Diagnosis not present

## 2015-01-16 DIAGNOSIS — F419 Anxiety disorder, unspecified: Secondary | ICD-10-CM | POA: Insufficient documentation

## 2015-01-16 DIAGNOSIS — K219 Gastro-esophageal reflux disease without esophagitis: Secondary | ICD-10-CM | POA: Diagnosis not present

## 2015-01-16 DIAGNOSIS — J45901 Unspecified asthma with (acute) exacerbation: Secondary | ICD-10-CM | POA: Diagnosis not present

## 2015-01-16 DIAGNOSIS — Z79899 Other long term (current) drug therapy: Secondary | ICD-10-CM | POA: Diagnosis not present

## 2015-01-16 DIAGNOSIS — F4310 Post-traumatic stress disorder, unspecified: Secondary | ICD-10-CM | POA: Diagnosis not present

## 2015-01-16 LAB — CBC
HCT: 41.3 % (ref 36.0–46.0)
HEMOGLOBIN: 13.3 g/dL (ref 12.0–15.0)
MCH: 27.5 pg (ref 26.0–34.0)
MCHC: 32.2 g/dL (ref 30.0–36.0)
MCV: 85.3 fL (ref 78.0–100.0)
Platelets: 235 10*3/uL (ref 150–400)
RBC: 4.84 MIL/uL (ref 3.87–5.11)
RDW: 13.1 % (ref 11.5–15.5)
WBC: 8.1 10*3/uL (ref 4.0–10.5)

## 2015-01-16 LAB — BASIC METABOLIC PANEL
Anion gap: 11 (ref 5–15)
BUN: <5 mg/dL — ABNORMAL LOW (ref 6–20)
CALCIUM: 9.8 mg/dL (ref 8.9–10.3)
CO2: 22 mmol/L (ref 22–32)
Chloride: 106 mmol/L (ref 101–111)
Creatinine, Ser: 0.74 mg/dL (ref 0.44–1.00)
GFR calc non Af Amer: >60 mL/min (ref >60)
Glucose, Bld: 97 mg/dL (ref 65–99)
POTASSIUM: 3.6 mmol/L (ref 3.5–5.1)
Sodium: 139 mmol/L (ref 135–145)

## 2015-01-16 LAB — I-STAT TROPONIN, ED: TROPONIN I, POC: 0 ng/mL (ref 0.00–0.08)

## 2015-01-16 MED ORDER — TETANUS-DIPHTH-ACELL PERTUSSIS 5-2.5-18.5 LF-MCG/0.5 IM SUSP
0.5000 mL | Freq: Once | INTRAMUSCULAR | Status: AC
  Administered 2015-01-16: 0.5 mL via INTRAMUSCULAR
  Filled 2015-01-16 (×2): qty 0.5

## 2015-01-16 NOTE — ED Notes (Signed)
Pt in EMS from home reporting L sided CP with SOB, nausea. Hx anxiety. Chest is tender upon palpation
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
## 2015-01-17 ENCOUNTER — Encounter (HOSPITAL_COMMUNITY): Payer: Self-pay | Admitting: Emergency Medicine

## 2015-01-17 ENCOUNTER — Emergency Department (EMERGENCY_DEPARTMENT_HOSPITAL)
Admit: 2015-01-17 | Discharge: 2015-01-20 | Disposition: A | Discharge status: Home / Self Care | Payer: Medicare Other | Attending: Emergency Medicine | Admitting: Emergency Medicine

## 2015-01-17 DIAGNOSIS — F250 Schizoaffective disorder, bipolar type: Secondary | ICD-10-CM | POA: Insufficient documentation

## 2015-01-17 DIAGNOSIS — K5900 Constipation, unspecified: Secondary | ICD-10-CM | POA: Insufficient documentation

## 2015-01-17 DIAGNOSIS — Z79899 Other long term (current) drug therapy: Secondary | ICD-10-CM | POA: Insufficient documentation

## 2015-01-17 DIAGNOSIS — F840 Autistic disorder: Secondary | ICD-10-CM | POA: Insufficient documentation

## 2015-01-17 DIAGNOSIS — J45909 Unspecified asthma, uncomplicated: Secondary | ICD-10-CM | POA: Insufficient documentation

## 2015-01-17 DIAGNOSIS — F329 Major depressive disorder, single episode, unspecified: Secondary | ICD-10-CM | POA: Insufficient documentation

## 2015-01-17 DIAGNOSIS — K219 Gastro-esophageal reflux disease without esophagitis: Secondary | ICD-10-CM | POA: Insufficient documentation

## 2015-01-17 DIAGNOSIS — Z8669 Personal history of other diseases of the nervous system and sense organs: Secondary | ICD-10-CM | POA: Insufficient documentation

## 2015-01-17 DIAGNOSIS — R45851 Suicidal ideations: Secondary | ICD-10-CM

## 2015-01-17 DIAGNOSIS — F603 Borderline personality disorder: Secondary | ICD-10-CM | POA: Diagnosis present

## 2015-01-17 DIAGNOSIS — R0789 Other chest pain: Secondary | ICD-10-CM | POA: Diagnosis not present

## 2015-01-17 DIAGNOSIS — Z3202 Encounter for pregnancy test, result negative: Secondary | ICD-10-CM | POA: Insufficient documentation

## 2015-01-17 DIAGNOSIS — Z793 Long term (current) use of hormonal contraceptives: Secondary | ICD-10-CM | POA: Insufficient documentation

## 2015-01-17 DIAGNOSIS — F4310 Post-traumatic stress disorder, unspecified: Secondary | ICD-10-CM | POA: Diagnosis present

## 2015-01-17 DIAGNOSIS — R1110 Vomiting, unspecified: Secondary | ICD-10-CM | POA: Insufficient documentation

## 2015-01-17 LAB — CBC WITH DIFFERENTIAL/PLATELET
BASOS PCT: 0 %
Basophils Absolute: 0 10*3/uL (ref 0.0–0.1)
EOS ABS: 0 10*3/uL (ref 0.0–0.7)
Eosinophils Relative: 0 %
HEMATOCRIT: 41.5 % (ref 36.0–46.0)
Hemoglobin: 13.5 g/dL (ref 12.0–15.0)
LYMPHS ABS: 2.7 10*3/uL (ref 0.7–4.0)
Lymphocytes Relative: 30 %
MCH: 27.8 pg (ref 26.0–34.0)
MCHC: 32.5 g/dL (ref 30.0–36.0)
MCV: 85.4 fL (ref 78.0–100.0)
MONO ABS: 0.6 10*3/uL (ref 0.1–1.0)
MONOS PCT: 6 %
Neutro Abs: 5.8 10*3/uL (ref 1.7–7.7)
Neutrophils Relative %: 64 %
Platelets: 240 10*3/uL (ref 150–400)
RBC: 4.86 MIL/uL (ref 3.87–5.11)
RDW: 13.1 % (ref 11.5–15.5)
WBC: 9 10*3/uL (ref 4.0–10.5)

## 2015-01-17 LAB — COMPREHENSIVE METABOLIC PANEL
ALBUMIN: 4.7 g/dL (ref 3.5–5.0)
ALK PHOS: 51 U/L (ref 38–126)
ALT: 35 U/L (ref 14–54)
AST: 23 U/L (ref 15–41)
Anion gap: 10 (ref 5–15)
BILIRUBIN TOTAL: 0.6 mg/dL (ref 0.3–1.2)
BUN: 7 mg/dL (ref 6–20)
CALCIUM: 9.5 mg/dL (ref 8.9–10.3)
CO2: 21 mmol/L — AB (ref 22–32)
CREATININE: 0.75 mg/dL (ref 0.44–1.00)
Chloride: 107 mmol/L (ref 101–111)
GFR calc Af Amer: >60 mL/min (ref >60)
GFR calc non Af Amer: >60 mL/min (ref >60)
GLUCOSE: 102 mg/dL — AB (ref 65–99)
Potassium: 3.6 mmol/L (ref 3.5–5.1)
SODIUM: 138 mmol/L (ref 135–145)
TOTAL PROTEIN: 7.5 g/dL (ref 6.5–8.1)

## 2015-01-17 LAB — BASIC METABOLIC PANEL
ANION GAP: 12 (ref 5–15)
BUN: 8 mg/dL (ref 6–20)
CHLORIDE: 108 mmol/L (ref 101–111)
CO2: 20 mmol/L — AB (ref 22–32)
CREATININE: 0.98 mg/dL (ref 0.44–1.00)
Calcium: 9.2 mg/dL (ref 8.9–10.3)
GFR calc non Af Amer: >60 mL/min (ref >60)
Glucose, Bld: 95 mg/dL (ref 65–99)
POTASSIUM: 3.8 mmol/L (ref 3.5–5.1)
SODIUM: 140 mmol/L (ref 135–145)

## 2015-01-17 LAB — I-STAT BETA HCG BLOOD, ED (MC, WL, AP ONLY): I-stat hCG, quantitative: <5 m[IU]/mL (ref <5)

## 2015-01-17 LAB — ACETAMINOPHEN LEVEL: Acetaminophen (Tylenol), Serum: <10 ug/mL — ABNORMAL LOW (ref 10–30)

## 2015-01-17 LAB — ETHANOL: Alcohol, Ethyl (B): <5 mg/dL (ref <5)

## 2015-01-17 LAB — D-DIMER, QUANTITATIVE (NOT AT ARMC): D-Dimer, Quant: <0.27 ug/mL-FEU (ref 0.00–0.50)

## 2015-01-17 LAB — SALICYLATE LEVEL: Salicylate Lvl: <4 mg/dL (ref 2.8–30.0)

## 2015-01-17 MED ORDER — KETOROLAC TROMETHAMINE 30 MG/ML IJ SOLN: 30.0000 mg | Freq: Once | INTRAMUSCULAR | Status: DC

## 2015-01-17 MED ORDER — TRAZODONE HCL 50 MG PO TABS
50.0000 mg | ORAL_TABLET | Freq: Every day | ORAL | Status: DC
  Administered 2015-01-17: 50 mg via ORAL
  Filled 2015-01-17: qty 1

## 2015-01-17 MED ORDER — SERTRALINE HCL 50 MG PO TABS
75.0000 mg | ORAL_TABLET | Freq: Every day | ORAL | Status: DC
Start: 2015-01-17 — End: 2015-01-18
  Administered 2015-01-18: 75 mg via ORAL
  Filled 2015-01-17 (×2): qty 2

## 2015-01-17 MED ORDER — PRAZOSIN HCL 2 MG PO CAPS
2.0000 mg | ORAL_CAPSULE | Freq: Every day | ORAL | Status: DC
Start: 2015-01-17 — End: 2015-01-20
  Administered 2015-01-17 – 2015-01-19 (×3): 2 mg via ORAL
  Filled 2015-01-17 (×4): qty 1

## 2015-01-17 MED ORDER — LEVONORGESTREL-ETHINYL ESTRAD 0.1-20 MG-MCG PO TABS
1.0000 | ORAL_TABLET | Freq: Every day | ORAL | Status: DC
  Administered 2015-01-17 – 2015-01-19 (×3): 1 via ORAL

## 2015-01-17 MED ORDER — RISPERIDONE 2 MG PO TABS
2.0000 mg | ORAL_TABLET | Freq: Two times a day (BID) | ORAL | Status: DC
Start: 2015-01-17 — End: 2015-01-20
  Administered 2015-01-17 – 2015-01-20 (×6): 2 mg via ORAL
  Filled 2015-01-17 (×6): qty 1

## 2015-01-17 MED ORDER — SENNA 8.6 MG PO TABS
2.0000 | ORAL_TABLET | Freq: Every day | ORAL | Status: DC
  Administered 2015-01-17 – 2015-01-19 (×3): 17.2 mg via ORAL
  Filled 2015-01-17 (×3): qty 2

## 2015-01-17 MED ORDER — KETOROLAC TROMETHAMINE 30 MG/ML IJ SOLN
30.0000 mg | Freq: Once | INTRAMUSCULAR | Status: AC
  Administered 2015-01-17: 30 mg via INTRAMUSCULAR
  Filled 2015-01-17: qty 1

## 2015-01-17 MED ORDER — LORAZEPAM 1 MG PO TABS
1.0000 mg | ORAL_TABLET | Freq: Once | ORAL | Status: AC
  Administered 2015-01-17: 1 mg via ORAL
  Filled 2015-01-17: qty 1

## 2015-01-17 NOTE — ED Notes (Signed)
Home medications placed in pt belongings bag.

## 2015-01-17 NOTE — ED Notes (Signed)
Bed: RESB Expected date:  Expected time:  Means of arrival:  Comments: EMS- OD took 96 ibuprofen

## 2015-01-17 NOTE — ED Notes (Signed)
TTS at bedside. 

## 2015-01-17 NOTE — ED Provider Notes (Signed)
CSN: [id number]     Arrival date & time 01/17/15  1716 History   First MD Initiated Contact with Patient 01/17/15 1738     Chief Complaint  Patient presents with  . Drug Overdose     (Consider location/radiation/quality/duration/timing/severity/associated sxs/prior Treatment) HPI Comments: Patient here after allegedly taking 96 tablets of 200 mg just over an hour prior to arrival. Patient has nausea but no vomiting. Denies any other ingestions at this time. States that this was a suicide attempt that she is upset by [facility]. Patient does have a history of schizoaffective disorder as well as borderline personality disorder. Denies any alcohol or illicit drug ingestions at this time. No visual or auditory hallucinations.  Patient is a 24 y.o. female presenting with Overdose. The history is provided by the patient.  Drug Overdose    Past Medical History  Diagnosis Date  . Asthma   . Depression   . Schizoaffective disorder, bipolar type ([place])   . Autism   . Essential tremor   . Chronic constipation   . Acid reflux    Past Surgical History  Procedure Laterality Date  . Mouth surgery     Family History  Problem Relation Age of Onset  . Mental illness Father   . PDD Brother   . Seizures Brother    Social History  Substance Use Topics  . Smoking status: Never Smoker   . Smokeless tobacco: None  . Alcohol Use: No   OB History    No data available     Review of Systems  All other systems reviewed and are negative.     Allergies  Coconut flavor; Geodon; Haloperidol and related; Lithium; Oxycodone; Prilosec; Seroquel; and Thorazine  Home Medications   Prior to Admission medications   Medication Sig Start Date End Date Taking? Authorizing Provider  cholecalciferol (VITAMIN D-400) 400 UNITS TABS tablet Take 800 Units by mouth daily.   Yes Historical Provider, MD  cyanocobalamin 500 MCG tablet Take 2,000 mcg by mouth daily.    Yes Historical Provider, MD  docusate sodium (COLACE) 100 MG capsule Take 1 capsule (100 mg total) by mouth at bedtime. Patient taking differently: Take 100 mg by mouth every morning.  03/29/14  Yes Niel Hummer, NP  ibuprofen (ADVIL,MOTRIN) 200 MG tablet Take 19,200 mg by mouth once as needed (Overdose.).   Yes Historical Provider, MD  levonorgestrel-ethinyl estradiol (AVIANE,ALESSE,LESSINA) 0.1-20 MG-MCG tablet Take 1 tablet by mouth daily.   Yes Historical Provider, MD  Multiple Vitamins-Minerals (MULTIVITAMIN) tablet Take 1 tablet by mouth daily.   Yes Historical Provider, MD  pantoprazole (PROTONIX) 40 MG tablet Take 1 tablet (40 mg total) by mouth daily. 03/29/14  Yes Niel Hummer, NP  prazosin (MINIPRESS) 1 MG capsule Take 1 capsule (1 mg total) by mouth at bedtime. Patient taking differently: Take 2 mg by mouth at bedtime.  03/29/14  Yes Niel Hummer, NP  risperiDONE (RISPERDAL) 2 MG tablet Take 2 mg by mouth 2 (two) times daily.   Yes Historical Provider, MD  senna (SENOKOT) 8.6 MG TABS tablet Take 2 tablets by mouth at bedtime.   Yes Historical Provider, MD  sertraline (ZOLOFT) 50 MG tablet Take 75 mg by mouth daily.   Yes Historical Provider, MD  traZODone (DESYREL) 50 MG tablet Take 1 tablet (50 mg total) by mouth at bedtime as needed for sleep. Patient taking differently: Take 50 mg by mouth at bedtime.  01/09/14  Yes John  C Withrow, FNP   BP 105/61 mmHg  Pulse 97  Temp(Src) 98.9 F (37.2 C) (Oral)  Resp 20  SpO2 97%  LMP 12/28/2014 Physical Exam  Constitutional: She is oriented to person, place, and time. She appears well-developed and well-nourished.  Non-toxic appearance. No distress.  HENT:  Head: Normocephalic and atraumatic.  Eyes: Conjunctivae, EOM and lids are normal. Pupils are equal, round, and reactive to light.  Neck: Normal range of motion. Neck supple. No tracheal deviation present. No thyroid mass present.  Cardiovascular: Normal rate, regular rhythm and normal  heart sounds.  Exam reveals no gallop.   No murmur heard. Pulmonary/Chest: Effort normal and breath sounds normal. No stridor. No respiratory distress. She has no decreased breath sounds. She has no wheezes. She has no rhonchi. She has no rales.  Abdominal: Soft. Normal appearance and bowel sounds are normal. She exhibits no distension. There is no tenderness. There is no rebound and no CVA tenderness.  Musculoskeletal: Normal range of motion. She exhibits no edema or tenderness.  Neurological: She is alert and oriented to person, place, and time. She has normal strength. No cranial nerve deficit or sensory deficit. GCS eye subscore is 4. GCS verbal subscore is 5. GCS motor subscore is 6.  Skin: Skin is warm and dry. No abrasion and no rash noted.  Psychiatric: Her speech is normal and behavior is normal. Her affect is blunt.  Nursing note and vitals reviewed.   ED Course  Procedures (including critical care time) Labs Review Labs Reviewed  COMPREHENSIVE METABOLIC PANEL - Abnormal; Notable for the following:    CO2 21 (*)    Glucose, Bld 102 (*)    All other components within normal limits  ACETAMINOPHEN LEVEL - Abnormal; Notable for the following:    Acetaminophen (Tylenol), Serum <10 (*)    All other components within normal limits  CBC WITH DIFFERENTIAL/PLATELET  SALICYLATE LEVEL  ETHANOL  URINE RAPID DRUG SCREEN, HOSP PERFORMED  I-STAT BETA HCG BLOOD, ED ([facility], [facility], [facility] ONLY)  I-STAT BETA HCG BLOOD, ED ([facility], [facility], [facility] ONLY)    Imaging Review Dg Chest 2 View  01/16/2015  CLINICAL DATA:  Chest wall pain tonight.  History of asthma. EXAM: CHEST  2 VIEW COMPARISON:  None. FINDINGS: The heart size and mediastinal contours are within normal limits. Mild bronchitic changes. Small nodular density projects in RIGHT mid lung zone, no specific follow-up given patient's young age. Both lungs are clear. The visualized skeletal structures are unremarkable. IMPRESSION: Mild bronchitic changes without  focal consolidation. Electronically Signed   By: Elon Alas M.D.   On: 01/16/2015 21:13   I have personally reviewed and evaluated these images and lab results as part of my medical decision-making.   EKG Interpretation   Date/Time:  Monday January 17 2015 17:24:36 EST Ventricular Rate:  108 PR Interval:  120 QRS Duration: 91 QT Interval:  364 QTC Calculation: 488 R Axis:   43 Text Interpretation:  Sinus tachycardia Borderline T abnormalities,  diffuse leads Borderline prolonged QT interval Confirmed by Zenia Resides  MD,  Dylana Shaw ([id number]) on 01/17/2015 5:38:46 PM      MDM   Final diagnoses:  None   low suspicion that patient actually ingested the amount of ibuprofen that she says she did. She has no signs of overdose at this time. She is not somnolent, she has had no emesis. She does not have any tinnitus. She was monitored here for 3 hours and continues to remain a symptomatic. I spoke  with poison control myself who agrees with not starting charcoal at this time. Will recheck bmet to make sure that renal function is stable. Patient is medically clear at this time for disposition by psychiatry    Lacretia Leigh, MD 01/17/15 2042

## 2015-01-17 NOTE — ED Notes (Addendum)
Per previous shift RN, according to poison control, activated charcoal was only to be given if it were given within the first hour after estimated ingestion. At the time of the call to poison control, it was about 58 min, therefore activated charcoal was not administered. Per their suggestions in lieu of charcoal, lab work to monitor renal function around midnight should be repeated.

## 2015-01-17 NOTE — ED Notes (Signed)
Poison control called.  Recommendations are: - Activated charcoal - Check BUN, Creatinine, and electrolytes. Repeat check at 6 hours.  - Concerned about Renal effects and upset stomach.   According to [facility], pt took a little over 200mg /kg of ibuprofen. PC not concerned about life threatening issues until someone has taken 400 mg/kg ibuprofen.
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
## 2015-01-17 NOTE — ED Notes (Signed)
Per EMS, Pt sts she is upset because her boyfriend is in a psych hospital. Pt sts she wanted to kill herself so she took 96, 200mg  ibuprofen. Pt A&Ox4. C/o some nausea. Denies vomiting diarrhea. Pt was seen at [facility] yesterday for anxiety and chest pain.

## 2015-01-17 NOTE — ED Notes (Signed)
Bed: [id number] Expected date: 01/17/15 Expected time: 8:12 PM Means of arrival:  Comments: [place]

## 2015-01-17 NOTE — ED Notes (Signed)
Patient restless, cooperative. Denies SI, HI, AVH. Reports that she got upset because her ACT team has not brought her the increased doses for her Trazodone and Zoloft. This lead her to taking an overdose. Reports nausea. Alert and oriented. Patient hopes to go home 12/13 and get her new rx from her ACT team.   Encouragement offered. Oriented to the unit.   Q 15 safety checks in place.

## 2015-01-18 DIAGNOSIS — F603 Borderline personality disorder: Secondary | ICD-10-CM | POA: Diagnosis not present

## 2015-01-18 DIAGNOSIS — F4310 Post-traumatic stress disorder, unspecified: Secondary | ICD-10-CM | POA: Diagnosis not present

## 2015-01-18 DIAGNOSIS — F250 Schizoaffective disorder, bipolar type: Secondary | ICD-10-CM

## 2015-01-18 DIAGNOSIS — R0789 Other chest pain: Secondary | ICD-10-CM | POA: Diagnosis not present

## 2015-01-18 LAB — BASIC METABOLIC PANEL
ANION GAP: 11 (ref 5–15)
ANION GAP: 7 (ref 5–15)
BUN: 12 mg/dL (ref 6–20)
BUN: 8 mg/dL (ref 6–20)
CHLORIDE: 108 mmol/L (ref 101–111)
CHLORIDE: 112 mmol/L — AB (ref 101–111)
CO2: 20 mmol/L — ABNORMAL LOW (ref 22–32)
CO2: 20 mmol/L — ABNORMAL LOW (ref 22–32)
Calcium: 9.6 mg/dL (ref 8.9–10.3)
Calcium: 9.3 mg/dL (ref 8.9–10.3)
Creatinine, Ser: 1.54 mg/dL — ABNORMAL HIGH (ref 0.44–1.00)
Creatinine, Ser: 1.23 mg/dL — ABNORMAL HIGH (ref 0.44–1.00)
GFR calc Af Amer: 53 mL/min — ABNORMAL LOW (ref >60)
GFR calc Af Amer: >60 mL/min (ref >60)
GFR calc non Af Amer: 46 mL/min — ABNORMAL LOW (ref >60)
GFR calc non Af Amer: 60 mL/min — ABNORMAL LOW (ref >60)
Glucose, Bld: 105 mg/dL — ABNORMAL HIGH (ref 65–99)
Glucose, Bld: 117 mg/dL — ABNORMAL HIGH (ref 65–99)
POTASSIUM: 4.3 mmol/L (ref 3.5–5.1)
POTASSIUM: 4 mmol/L (ref 3.5–5.1)
SODIUM: 139 mmol/L (ref 135–145)
SODIUM: 139 mmol/L (ref 135–145)

## 2015-01-18 MED ORDER — ONDANSETRON HCL 4 MG/2ML IJ SOLN
4.0000 mg | Freq: Once | INTRAMUSCULAR | Status: AC
  Administered 2015-01-18: 4 mg via INTRAVENOUS
  Filled 2015-01-18: qty 2

## 2015-01-18 MED ORDER — SODIUM CHLORIDE 0.9 % IV BOLUS (SEPSIS)
2000.0000 mL | Freq: Once | INTRAVENOUS | Status: AC
  Administered 2015-01-18: 2000 mL via INTRAVENOUS

## 2015-01-18 MED ORDER — DOCUSATE SODIUM 100 MG PO CAPS: 100.0000 mg | ORAL_CAPSULE | Freq: Every day | ORAL | Status: DC

## 2015-01-18 MED ORDER — CHOLECALCIFEROL 10 MCG (400 UNIT) PO TABS
800.0000 [IU] | ORAL_TABLET | Freq: Every day | ORAL | Status: DC
  Administered 2015-01-18 – 2015-01-20 (×3): 800 [IU] via ORAL
  Filled 2015-01-18 (×3): qty 2

## 2015-01-18 MED ORDER — SERTRALINE HCL 50 MG PO TABS
100.0000 mg | ORAL_TABLET | Freq: Every day | ORAL | Status: DC
  Administered 2015-01-19 – 2015-01-20 (×2): 100 mg via ORAL
  Filled 2015-01-18 (×2): qty 2

## 2015-01-18 MED ORDER — DOCUSATE SODIUM 100 MG PO CAPS
100.0000 mg | ORAL_CAPSULE | Freq: Two times a day (BID) | ORAL | Status: DC
  Administered 2015-01-18 – 2015-01-20 (×5): 100 mg via ORAL
  Filled 2015-01-18 (×5): qty 1

## 2015-01-18 MED ORDER — VITAMIN B-12 1000 MCG PO TABS
2000.0000 ug | ORAL_TABLET | Freq: Every day | ORAL | Status: DC
  Administered 2015-01-18 – 2015-01-20 (×3): 2000 ug via ORAL
  Filled 2015-01-18 (×3): qty 2

## 2015-01-18 MED ORDER — TRAZODONE HCL 100 MG PO TABS
100.0000 mg | ORAL_TABLET | Freq: Every day | ORAL | Status: DC
  Administered 2015-01-18 – 2015-01-19 (×2): 100 mg via ORAL
  Filled 2015-01-18 (×2): qty 1

## 2015-01-18 NOTE — ED Notes (Signed)
Patient denies SI,HI and AVH at this time. Patient however, admits to talking an overdose prior to admission. Plan of care discussed with patient. Patient voices no complaints or concerns at this time. Encouragement and support provided and safety maintain. Q 15 min safety checks remain in place.

## 2015-01-18 NOTE — ED Notes (Signed)
Bed: [id number] Expected date:  Expected time:  Means of arrival:  Comments: [facility]

## 2015-01-18 NOTE — ED Notes (Signed)
Phlebotomy in for lab draw.

## 2015-01-18 NOTE — ED Notes (Signed)
C/o nausea, stomach pain. Making gagging noises in room. Patient to restroom. Reports 1 episode of emesis.

## 2015-01-18 NOTE — BH Assessment (Signed)
[facility] Assessment Progress Note  The following facilities have been contacted to seek placement for this pt, with results as noted:  Beds available, information sent, decision pending:  [facility]   At capacity:  [facility] (no female beds) [facility] (no female beds) [place], [place] Triage Specialist [phone]

## 2015-01-18 NOTE — ED Notes (Signed)
Bed: [id number] Expected date: 01/18/15 Expected time:  Means of arrival:  Comments: IV infusion in room 4

## 2015-01-18 NOTE — ED Notes (Signed)
Patty with poison control called. Closing case. Suggests calling them back if CO2 continues to drop.

## 2015-01-18 NOTE — ED Notes (Signed)
Bed: [id number] Expected date:  Expected time:  Means of arrival:  Comments: [facility] 42

## 2015-01-18 NOTE — ED Notes (Signed)
Pt transferred to ED rm 4 for IV fluids and IV meds.

## 2015-01-18 NOTE — ED Notes (Signed)
Pt. did not want anything to eat at this time, she stated that she was not hungry. She did agree to drink some sprite and tolerate it well.

## 2015-01-18 NOTE — ED Provider Notes (Signed)
BUN  Date Value Ref Range Status  01/18/2015 12 6 - 20 mg/dL Final  01/17/2015 8 6 - 20 mg/dL Final  01/17/2015 7 6 - 20 mg/dL Final  01/16/2015 <5* 6 - 20 mg/dL Final   CREATININE, SER  Date Value Ref Range Status  01/18/2015 1.54* 0.44 - 1.00 mg/dL Final  01/17/2015 0.98 0.44 - 1.00 mg/dL Final  01/17/2015 0.75 0.44 - 1.00 mg/dL Final  01/16/2015 0.74 0.44 - 1.00 mg/dL Final     Mild elevation in creatinine. Will IV hydrate in the ER and recheck a BMET later today.   Jola Schmidt, MD 01/18/15 [phone]

## 2015-01-18 NOTE — Consult Note (Signed)
[place] [facility] Consult   Reason for Consult:  Over dose, Depression Referring Physician:  EDP Patient Identification: [name] MRN:  [id number] Principal Diagnosis: Schizoaffective disorder, bipolar type [facility]) Diagnosis:   Patient Active Problem List   Diagnosis Date Noted  . Schizoaffective disorder, bipolar type ([place]) [F25.0] 03/10/2014    Priority: High  . Suicidal ideation [R45.851]   . Homicidal ideation [R45.850]   . Suicidal ideations [R45.851] 06/23/2014  . Prolonged QT syndrome [I45.81]   . EKG abnormality [R94.31] 03/12/2014  . Borderline personality disorder [F60.3] 03/10/2014  . PTSD (post-traumatic stress disorder) [F43.10] 03/10/2014  . Asperger's syndrome [F84.5] 03/10/2014    Total Time spent with patient: 45 minutes  Subjective:   [name] is a 24 y.o. female patient admitted with Over dose, Depression.  HPI:  Caucasian female, 24 years old was evaluated this morning after she over dosed on 93 tablets of 200 mg of Ibuprofen.  Patient reports that she has been depressed since her boyfriend was admitted to [facility] for treatment of Schizophrenia.  Patient reported that her intention for taking this many Ibuprofen was not to hurt self.  Patient reported that she was missing her friend and she decided to take the pills.  Patient was laughing and joking with providers on arrival to her room.  Her mood was not congruent with her affect.  Patient lives in her apartment that she shares with her boyfriend.  Patient has an ACT with [facility] and goes there for medication management.  Patient today denies SI/HI/AVH.  Patient has been accepted for admission and we will be seeking placement at any facility with available beds.    Past Psychiatric History:  Schizoaffective  Disorder, Borderline personality disorder, PTSD, Asperger's Syndrome.  Risk to Self: Suicidal Ideation: No-Not Currently/Within Last 6 Months Suicidal Intent: No Is patient at risk for  suicide?: Yes Suicidal Plan?: No-Not Currently/Within Last 6 Months Access to Means: Yes Specify Access to Suicidal Means: Pt claims to have taken 94 ibuprophen What has been your use of drugs/alcohol within the last 12 months?: Pt denies How many times?:  (Multiple) Other Self Harm Risks: Cutting Triggers for Past Attempts: Unpredictable Intentional Self Injurious Behavior: Cutting Comment - Self Injurious Behavior: Cut on her legs a week ago. Risk to Others: Homicidal Ideation: No Thoughts of Harm to Others: No Current Homicidal Intent: No Current Homicidal Plan: No Access to Homicidal Means: No Identified Victim: No one History of harm to others?: No Assessment of Violence: None Noted Violent Behavior Description: None reported Does patient have access to weapons?: No Criminal Charges Pending?: No Does patient have a court date: No Prior Inpatient Therapy: Prior Inpatient Therapy: Yes Prior Therapy Dates: Just d/c'ed three weeks ago Prior Therapy Facilty/Provider(s): [facility] Reason for Treatment: Threats to kill self over group home. Prior Outpatient Therapy: Prior Outpatient Therapy: Yes Prior Therapy Dates: Last three weeks Prior Therapy Facilty/Provider(s): [facility] ACTT team Reason for Treatment: ACTT services Does patient have an ACCT team?: Yes Does patient have Intensive In-House Services?  : No Does patient have [facility] services? : Yes Does patient have P4CC services?: No  Past Medical History:  Past Medical History  Diagnosis Date  . Asthma   . Depression   . Schizoaffective disorder, bipolar type ([place])   . Autism   . Essential tremor   . Chronic constipation   . Acid reflux     Past Surgical History  Procedure Laterality Date  . Mouth surgery  Family History:  Family History  Problem Relation Age of Onset  . Mental illness Father   . PDD Brother   . Seizures Brother    Family Psychiatric  History:  Father has an unknown mental illness, Brother has  PDD Social History:  History  Alcohol Use No     History  Drug Use No    Social History   Social History  . Marital Status: Single    Spouse Name: N/A  . Number of Children: N/A  . Years of Education: N/A   Social History Main Topics  . Smoking status: Never Smoker   . Smokeless tobacco: None  . Alcohol Use: No  . Drug Use: No  . Sexual Activity: Yes   Other Topics Concern  . None   Social History Narrative   Additional Social History:    Pain Medications: None Prescriptions: Pt has ACTT team services through [facility].  Pt says that medication has been adjusted recently but patient claims she has not gotten her new prescriptions from ACTT team. Over the Counter: N/A History of alcohol / drug use?: No history of alcohol / drug abuse    Allergies:   Allergies  Allergen Reactions  . Coconut Flavor Anaphylaxis  . Geodon [Ziprasidone Hcl] Other (See Comments)    Paralysis of mouth  . Haloperidol And Related Other (See Comments)    Paralysis of mouth   . Lithium Other (See Comments)    Seizure like muscle spasms   . Oxycodone Other (See Comments)    hallucincations   . Prilosec [Omeprazole] Nausea And Vomiting  . Seroquel [Quetiapine Fumarate]     TOO STRONG  . Thorazine [Chlorpromazine] Palpitations    Labs:  Results for orders placed or performed during the hospital encounter of 01/17/15 (from the past 48 hour(s))  CBC with Differential     Status: None   Collection Time: 01/17/15  5:44 PM  Result Value Ref Range   WBC 9.0 4.0 - 10.5 K/uL   RBC 4.86 3.87 - 5.11 MIL/uL   Hemoglobin 13.5 12.0 - 15.0 g/dL   HCT 41.5 36.0 - 46.0 %   MCV 85.4 78.0 - 100.0 fL   MCH 27.8 26.0 - 34.0 pg   MCHC 32.5 30.0 - 36.0 g/dL   RDW 13.1 11.5 - 15.5 %   Platelets 240 150 - 400 K/uL   Neutrophils Relative % 64 %   Neutro Abs 5.8 1.7 - 7.7 K/uL   Lymphocytes Relative 30 %   Lymphs Abs 2.7 0.7 - 4.0 K/uL   Monocytes Relative 6 %   Monocytes Absolute 0.6 0.1 - 1.0 K/uL    Eosinophils Relative 0 %   Eosinophils Absolute 0.0 0.0 - 0.7 K/uL   Basophils Relative 0 %   Basophils Absolute 0.0 0.0 - 0.1 K/uL  Comprehensive metabolic panel     Status: Abnormal   Collection Time: 01/17/15  5:44 PM  Result Value Ref Range   Sodium 138 135 - 145 mmol/L   Potassium 3.6 3.5 - 5.1 mmol/L   Chloride 107 101 - 111 mmol/L   CO2 21 (L) 22 - 32 mmol/L   Glucose, Bld 102 (H) 65 - 99 mg/dL   BUN 7 6 - 20 mg/dL   Creatinine, Ser 0.75 0.44 - 1.00 mg/dL   Calcium 9.5 8.9 - 10.3 mg/dL   Total Protein 7.5 6.5 - 8.1 g/dL   Albumin 4.7 3.5 - 5.0 g/dL   AST 23 15 - 41 U/L  ALT 35 14 - 54 U/L   Alkaline Phosphatase 51 38 - 126 U/L   Total Bilirubin 0.6 0.3 - 1.2 mg/dL   GFR calc non Af Amer >60 >60 mL/min   GFR calc Af Amer >60 >60 mL/min    Comment: (NOTE) The eGFR has been calculated using the CKD EPI equation. This calculation has not been validated in all clinical situations. eGFR's persistently <60 mL/min signify possible Chronic Kidney Disease.    Anion gap 10 5 - 15  Salicylate level     Status: None   Collection Time: 01/17/15  5:45 PM  Result Value Ref Range   Salicylate Lvl <6.7 2.8 - 30.0 mg/dL  Acetaminophen level     Status: Abnormal   Collection Time: 01/17/15  5:45 PM  Result Value Ref Range   Acetaminophen (Tylenol), Serum <10 (L) 10 - 30 ug/mL    Comment:        THERAPEUTIC CONCENTRATIONS VARY SIGNIFICANTLY. A RANGE OF 10-30 ug/mL MAY BE AN EFFECTIVE CONCENTRATION FOR MANY PATIENTS. HOWEVER, SOME ARE BEST TREATED AT CONCENTRATIONS OUTSIDE THIS RANGE. ACETAMINOPHEN CONCENTRATIONS >150 ug/mL AT 4 HOURS AFTER INGESTION AND >50 ug/mL AT 12 HOURS AFTER INGESTION ARE OFTEN ASSOCIATED WITH TOXIC REACTIONS.   Ethanol     Status: None   Collection Time: 01/17/15  5:45 PM  Result Value Ref Range   Alcohol, Ethyl (B) <5 <5 mg/dL    Comment:        LOWEST DETECTABLE LIMIT FOR SERUM ALCOHOL IS 5 mg/dL FOR MEDICAL PURPOSES ONLY   I-Stat Beta hCG  blood, ED ([facility], [facility], [facility] only)     Status: None   Collection Time: 01/17/15  5:51 PM  Result Value Ref Range   I-stat hCG, quantitative <5.0 <5 mIU/mL   Comment 3            Comment:   GEST. AGE      CONC.  (mIU/mL)   <=1 WEEK        5 - 50     2 WEEKS       50 - 500     3 WEEKS       100 - 10,000     4 WEEKS     1,000 - 30,000        FEMALE AND NON-PREGNANT FEMALE:     LESS THAN 5 mIU/mL   Basic metabolic panel     Status: Abnormal   Collection Time: 01/17/15 10:36 PM  Result Value Ref Range   Sodium 140 135 - 145 mmol/L   Potassium 3.8 3.5 - 5.1 mmol/L   Chloride 108 101 - 111 mmol/L   CO2 20 (L) 22 - 32 mmol/L   Glucose, Bld 95 65 - 99 mg/dL   BUN 8 6 - 20 mg/dL   Creatinine, Ser 0.98 0.44 - 1.00 mg/dL   Calcium 9.2 8.9 - 10.3 mg/dL   GFR calc non Af Amer >60 >60 mL/min   GFR calc Af Amer >60 >60 mL/min    Comment: (NOTE) The eGFR has been calculated using the CKD EPI equation. This calculation has not been validated in all clinical situations. eGFR's persistently <60 mL/min signify possible Chronic Kidney Disease.    Anion gap 12 5 - 15  Basic metabolic panel     Status: Abnormal   Collection Time: 01/18/15  6:47 AM  Result Value Ref Range   Sodium 139 135 - 145 mmol/L   Potassium 4.3 3.5 - 5.1 mmol/L  Chloride 108 101 - 111 mmol/L   CO2 20 (L) 22 - 32 mmol/L   Glucose, Bld 105 (H) 65 - 99 mg/dL   BUN 12 6 - 20 mg/dL   Creatinine, Ser 1.54 (H) 0.44 - 1.00 mg/dL   Calcium 9.6 8.9 - 10.3 mg/dL   GFR calc non Af Amer 46 (L) >60 mL/min   GFR calc Af Amer 53 (L) >60 mL/min    Comment: (NOTE) The eGFR has been calculated using the CKD EPI equation. This calculation has not been validated in all clinical situations. eGFR's persistently <60 mL/min signify possible Chronic Kidney Disease.    Anion gap 11 5 - 15    Current Facility-Administered Medications  Medication Dose Route Frequency Provider Last Rate Last Dose  . cholecalciferol (VITAMIN D) tablet 800 Units  800  Units Oral Daily Lexandra Rettke   800 Units at 01/18/15 1223  . docusate sodium (COLACE) capsule 100 mg  100 mg Oral BID Aleese Kamps   100 mg at 01/18/15 1223  . levonorgestrel-ethinyl estradiol (AVIANE,ALESSE,LESSINA) 0.1-20 MG-MCG per tablet 1 tablet  1 tablet Oral Daily Lacretia Leigh, MD   Stopped at 01/18/15 1219  . prazosin (MINIPRESS) capsule 2 mg  2 mg Oral QHS Lacretia Leigh, MD   2 mg at 01/17/15 2131  . risperiDONE (RISPERDAL) tablet 2 mg  2 mg Oral BID Lacretia Leigh, MD   2 mg at 01/18/15 1035  . senna (SENOKOT) tablet 17.2 mg  2 tablet Oral QHS Lacretia Leigh, MD   17.2 mg at 01/17/15 2131  . [START ON 01/19/2015] sertraline (ZOLOFT) tablet 100 mg  100 mg Oral Daily Arlander Gillen      . traZODone (DESYREL) tablet 100 mg  100 mg Oral QHS Rahn Lacuesta      . vitamin B-12 (CYANOCOBALAMIN) tablet 2,000 mcg  2,000 mcg Oral Daily Senay Sistrunk   2,000 mcg at 01/18/15 1223   Current Outpatient Prescriptions  Medication Sig Dispense Refill  . cholecalciferol (VITAMIN D-400) 400 UNITS TABS tablet Take 800 Units by mouth daily.    . cyanocobalamin 500 MCG tablet Take 2,000 mcg by mouth daily.    Marland Kitchen docusate sodium (COLACE) 100 MG capsule Take 1 capsule (100 mg total) by mouth at bedtime. (Patient taking differently: Take 100 mg by mouth every morning. ) 10 capsule 0  . ibuprofen (ADVIL,MOTRIN) 200 MG tablet Take 19,200 mg by mouth once as needed (Overdose.).    Marland Kitchen levonorgestrel-ethinyl estradiol (AVIANE,ALESSE,LESSINA) 0.1-20 MG-MCG tablet Take 1 tablet by mouth daily.    . Multiple Vitamins-Minerals (MULTIVITAMIN) tablet Take 1 tablet by mouth daily.    . pantoprazole (PROTONIX) 40 MG tablet Take 1 tablet (40 mg total) by mouth daily.    . prazosin (MINIPRESS) 1 MG capsule Take 1 capsule (1 mg total) by mouth at bedtime. (Patient taking differently: Take 2 mg by mouth at bedtime. )    . risperiDONE (RISPERDAL) 2 MG tablet Take 2 mg by mouth 2 (two) times daily.    Marland Kitchen senna (SENOKOT)  8.6 MG TABS tablet Take 2 tablets by mouth at bedtime.    . sertraline (ZOLOFT) 50 MG tablet Take 75 mg by mouth daily.    . traZODone (DESYREL) 50 MG tablet Take 1 tablet (50 mg total) by mouth at bedtime as needed for sleep. (Patient taking differently: Take 50 mg by mouth at bedtime. )      Musculoskeletal: Strength & Muscle Tone: within normal limits Gait & Station: normal Patient leans: N/A  Psychiatric Specialty Exam: Review of Systems  Constitutional: Negative.   HENT: Negative.   Eyes: Negative.   Respiratory: Negative.   Cardiovascular: Negative.   Gastrointestinal: Negative.   Genitourinary: Negative.   Musculoskeletal: Negative.   Skin: Negative.   Neurological: Negative.   Endo/Heme/Allergies: Negative.     Blood pressure 115/70, pulse 93, temperature 97.7 F (36.5 C), temperature source Oral, resp. rate 16, last menstrual period 12/28/2014, SpO2 97 %.There is no weight on file to calculate BMI.  General Appearance: Casual  Eye Contact::  Good  Speech:  Clear and Coherent and Normal Rate  Volume:  Normal  Mood:  Anxious  Affect:  Congruent  Thought Process:  Coherent, Goal Directed and Intact  Orientation:  Full (Time, Place, and Person)  Thought Content:  WDL  Suicidal Thoughts:  No  Homicidal Thoughts:  No  Memory:  Immediate;   Good Recent;   Good Remote;   Good  Judgement:  Poor  Insight:  Shallow  Psychomotor Activity:  Normal  Concentration:  Good  Recall:  NA  Fund of Knowledge:Poor  Language: Good  Akathisia:  No  Handed:  Right  AIMS (if indicated):     Assets:  Desire for Improvement  ADL's:  Intact  Cognition: WNL  Sleep:      Treatment Plan Summary: Daily contact with patient to assess and evaluate symptoms and progress in treatment and Medication management  Disposition:  Accepted for admission and we will be seeking placement at any facility with available bed.  We have resumed home medications.  Delfin Gant    PMHNP-BC 01/18/2015 3:58 PM Patient seen face-to-face for psychiatric evaluation, chart reviewed and case discussed with the physician extender and developed treatment plan. Reviewed the information documented and agree with the treatment plan. [name], MD

## 2015-01-19 DIAGNOSIS — R0789 Other chest pain: Secondary | ICD-10-CM | POA: Diagnosis not present

## 2015-01-19 MED ORDER — ADULT MULTIVITAMIN W/MINERALS CH
1.0000 | ORAL_TABLET | Freq: Once | ORAL | Status: AC
  Administered 2015-01-19: 1 via ORAL
  Filled 2015-01-19: qty 1

## 2015-01-19 NOTE — BH Assessment (Signed)
[place] Assessment Progress Note  The following facilities have been contacted to seek placement for this pt, with results as noted:  Beds available, information sent, decision pending:  [facility]   No beds available, but accepting referrals for future consideration; information sent:  [facility]   Declined:  [facility] (diagnosed with Aspergers; unable to program)   At capacity:  [facility], [place] Triage Specialist [phone]

## 2015-01-19 NOTE — ED Notes (Signed)
ACTT team update provided.

## 2015-01-19 NOTE — ED Notes (Signed)
Patient denies SI, HI and AVh at this time. Patient contracts for safety at this time. Plan of care discussed with patient. Patient voices no complaints or concerns at this time. Encouragement and support provided and safety maintain. Q 15 min safety checks remain in place.

## 2015-01-19 NOTE — Clinical Social Work Note (Signed)
CSW met with pt for reassessment.  Pt asked if she could go home.  Pt stated that she had just started with an act team two weeks before she was hospitalized.  Pt denied any suicidal ideations, homicidal ideations or hallucination. Pt still meets criteria for inpatient per her admission criteria when presented to the ED.  Dede Query LCSW [facility]/Triage/TTS

## 2015-01-19 NOTE — ED Provider Notes (Signed)
BUN  Date Value Ref Range Status  01/18/2015 8 6 - 20 mg/dL Final  01/18/2015 12 6 - 20 mg/dL Final  01/17/2015 8 6 - 20 mg/dL Final  01/17/2015 7 6 - 20 mg/dL Final   CREATININE, SER  Date Value Ref Range Status  01/18/2015 1.23* 0.44 - 1.00 mg/dL Final  01/18/2015 1.54* 0.44 - 1.00 mg/dL Final  01/17/2015 0.98 0.44 - 1.00 mg/dL Final  01/17/2015 0.75 0.44 - 1.00 mg/dL Final     Resolving renal insufficiency with IV hydration. Medically cleared  Jola Schmidt, MD 01/19/15 [phone]

## 2015-01-20 DIAGNOSIS — F250 Schizoaffective disorder, bipolar type: Secondary | ICD-10-CM | POA: Diagnosis not present

## 2015-01-20 DIAGNOSIS — F603 Borderline personality disorder: Secondary | ICD-10-CM | POA: Diagnosis not present

## 2015-01-20 DIAGNOSIS — R0789 Other chest pain: Secondary | ICD-10-CM | POA: Diagnosis not present

## 2015-01-20 DIAGNOSIS — F4310 Post-traumatic stress disorder, unspecified: Secondary | ICD-10-CM | POA: Diagnosis not present

## 2015-01-20 MED ORDER — ADULT MULTIVITAMIN W/MINERALS CH
1.0000 | ORAL_TABLET | Freq: Once | ORAL | Status: AC
  Administered 2015-01-20: 1 via ORAL
  Filled 2015-01-20: qty 1

## 2015-01-20 MED ORDER — PANTOPRAZOLE SODIUM 40 MG PO TBEC
40.0000 mg | DELAYED_RELEASE_TABLET | Freq: Once | ORAL | Status: AC
  Administered 2015-01-20: 40 mg via ORAL
  Filled 2015-01-20: qty 1

## 2015-01-20 MED ORDER — TRAZODONE HCL 100 MG PO TABS: 100.0000 mg | ORAL_TABLET | Freq: Every day | ORAL | Status: DC

## 2015-01-20 MED ORDER — SERTRALINE HCL 100 MG PO TABS: 100.0000 mg | ORAL_TABLET | Freq: Every day | ORAL | Status: DC

## 2015-01-20 NOTE — ED Notes (Signed)
Pt states that she packed a large bag of her belongings prior to overdosing on medication because she knew that no one was available to bring belongings to her. She felt that she would be going to an inpatient facility. At the present time she wants to go home.

## 2015-01-20 NOTE — Discharge Instructions (Signed)
For your ongoing mental health needs, you are advised to continue treatment with the [facility] ACT Team:       [facility]      [address]      [place], [place] [address]      [phone]      [phone]

## 2015-01-20 NOTE — Consult Note (Signed)
Psychiatric Specialty Exam: Physical Exam  ROS  Blood pressure 98/62, pulse 70, temperature 97.7 F (36.5 C), temperature source Oral, resp. rate 18, last menstrual period 12/28/2014, SpO2 100 %.There is no weight on file to calculate BMI.  General Appearance: Casual and Fairly Groomed  Engineer, water::  Good  Speech:  Clear and Coherent and Normal Rate  Volume:  Normal  Mood:  Euthymic  Affect:  Congruent  Thought Process:  Coherent, Goal Directed and Intact  Orientation:  Full (Time, Place, and Person)  Thought Content:  WDL  Suicidal Thoughts:  No  Homicidal Thoughts:  No  Memory:  Immediate;   Good Recent;   Good Remote;   Good  Judgement:  Good  Insight:  Good  Psychomotor Activity:  Normal  Concentration:  Good  Recall:  NA  Fund of Knowledge:  Good  Language:  Good  Akathisia:  NA  Handed:  Right  AIMS (if indicated):     Assets:  Desire for Improvement  ADL's:  Intact  Cognition:  WNL  Sleep:      Patient is calm and cooperative.  Reports good sleep and appetite.  Patient states she will not OD on any medications because that is not the correct means of solving her problem.  Patient reports that she thought through what she has done and is not willing to lose her kidney.  Patient denies SI/HI/AVH.  Patient agrees to continue taking her medications as prescribed at home.  Patient is discharged. Schizoaffective disorder, bipolar type [facility])   Plan:  Discharge home, follow up with your providers at [facility].  Charmaine Downs   PMHNP-BC Patient seen face-to-face for psychiatric evaluation, chart reviewed and case discussed with the physician extender and developed treatment plan. Reviewed the information documented and agree with the treatment plan. [name], MD

## 2015-01-20 NOTE — BH Assessment (Addendum)
[place] Assessment Progress Note  Per [name], MD, this pt does not require psychiatric hospitalization at this time. She is to be released from [facility] with recommendation to follow up with her ACT Team services through [facility]. This recommendation shall be included in pt's discharge instructions. Additionally, at 10:23 this Probation officer spoke to [employer] at [place]. She agrees to have an ACT Team member come to [facility]. Pt's nurse, Diane, has been notified.  [name], MA Triage Specialist [phone]   Addendum:  This Probation officer called pt's legal guardian, Delton See at [employer] [facility] [place].  Please note that his phone number, recorded elsewhere in EPIC, is incorrect.  The correct number is [phone].  He requests that pt's discharge summary be faxed to him at [phone].  This has been completed.  [name], [place] Triage Specialist [phone]

## 2015-01-20 NOTE — [facility] Suicide Risk Assessment (Cosign Needed)
Suicide Risk Assessment  Discharge Assessment   [facility] Discharge Suicide Risk Assessment   Demographic Factors:  Adolescent or young adult, Low socioeconomic status, Living alone, Unemployed and She has an ACT team and guardian  Total Time spent with patient: 20 minutes  Musculoskeletal: Strength & Muscle Tone: within normal limits Gait & Station: normal Patient leans: N/A  Psychiatric Specialty Exam:     Blood pressure 98/62, pulse 70, temperature 97.7 F (36.5 C), temperature source Oral, resp. rate 18, last menstrual period 12/28/2014, SpO2 100 %.There is no weight on file to calculate BMI.  General Appearance: Casual and Fairly Groomed  Engineer, water:: Good  Speech: Clear and Coherent and Normal Rate  Volume: Normal  Mood: Euthymic  Affect: Congruent  Thought Process: Coherent, Goal Directed and Intact  Orientation: Full (Time, Place, and Person)  Thought Content: WDL  Suicidal Thoughts: No  Homicidal Thoughts: No  Memory: Immediate; Good Recent; Good Remote; Good  Judgement: Good  Insight: Good  Psychomotor Activity: Normal  Concentration: Good  Recall: NA  Fund of Knowledge: Good  Language: Good  Akathisia: NA  Handed: Right  AIMS (if indicated):    Assets: Desire for Improvement  ADL's: Intact  Cognition: WNL            Has this patient used any form of tobacco in the last 30 days? (Cigarettes, Smokeless Tobacco, Cigars, and/or Pipes) N/A  Mental Status Per Nursing Assessment::   On Admission:     Current Mental Status by Physician: NA  Loss Factors: NA  Historical Factors: Prior suicide attempts  Risk Reduction Factors:   Positive therapeutic relationship  Continued Clinical Symptoms:  Bipolar Disorder:   Depressive phase Previous Psychiatric Diagnoses and Treatments  Cognitive Features That Contribute To Risk:  Polarized thinking    Suicide Risk:  Minimal: No identifiable suicidal  ideation.  Patients presenting with no risk factors but with morbid ruminations; may be classified as minimal risk based on the severity of the depressive symptoms  Principal Problem: Schizoaffective disorder, bipolar type [facility]) Discharge Diagnoses:  Patient Active Problem List   Diagnosis Date Noted  . Schizoaffective disorder, bipolar type ([place]) [F25.0] 03/10/2014    Priority: Low  . Suicidal ideation [R45.851]   . Homicidal ideation [R45.850]   . Suicidal ideations [R45.851] 06/23/2014  . Prolonged QT syndrome [I45.81]   . EKG abnormality [R94.31] 03/12/2014  . Borderline personality disorder [F60.3] 03/10/2014  . PTSD (post-traumatic stress disorder) [F43.10] 03/10/2014  . Asperger's syndrome [F84.5] 03/10/2014      Plan Of Care/Follow-up recommendations:  Activity:  as tolerated Diet:  regular  Is patient on multiple antipsychotic therapies at discharge:  No   Has Patient had three or more failed trials of antipsychotic monotherapy by history:  No  Recommended Plan for Multiple Antipsychotic Therapies: NA    Delfin Gant    PMHNP-BC 01/20/2015, 10:56 AM

## 2015-02-18 DIAGNOSIS — Z131 Encounter for screening for diabetes mellitus: Secondary | ICD-10-CM | POA: Diagnosis not present

## 2015-02-18 DIAGNOSIS — E559 Vitamin D deficiency, unspecified: Secondary | ICD-10-CM | POA: Diagnosis not present

## 2015-02-18 DIAGNOSIS — E538 Deficiency of other specified B group vitamins: Secondary | ICD-10-CM | POA: Diagnosis not present

## 2015-02-18 DIAGNOSIS — Z124 Encounter for screening for malignant neoplasm of cervix: Secondary | ICD-10-CM | POA: Diagnosis not present

## 2015-02-18 DIAGNOSIS — Z79899 Other long term (current) drug therapy: Secondary | ICD-10-CM | POA: Diagnosis not present

## 2015-02-18 DIAGNOSIS — F259 Schizoaffective disorder, unspecified: Secondary | ICD-10-CM | POA: Diagnosis not present

## 2015-02-18 DIAGNOSIS — Z13220 Encounter for screening for lipoid disorders: Secondary | ICD-10-CM | POA: Diagnosis not present

## 2015-02-18 DIAGNOSIS — K210 Gastro-esophageal reflux disease with esophagitis: Secondary | ICD-10-CM | POA: Diagnosis not present

## 2015-02-18 DIAGNOSIS — Z309 Encounter for contraceptive management, unspecified: Secondary | ICD-10-CM | POA: Diagnosis not present

## 2015-02-18 DIAGNOSIS — N760 Acute vaginitis: Secondary | ICD-10-CM | POA: Diagnosis not present

## 2015-02-18 DIAGNOSIS — K5900 Constipation, unspecified: Secondary | ICD-10-CM | POA: Diagnosis not present

## 2015-02-24 ENCOUNTER — Encounter (HOSPITAL_COMMUNITY): Payer: Self-pay | Admitting: *Deleted

## 2015-02-24 ENCOUNTER — Emergency Department (HOSPITAL_COMMUNITY)
Admission: EM | Admit: 2015-02-24 | Discharge: 2015-02-24 | Disposition: A | Discharge status: Home / Self Care | Payer: Medicare Other | Attending: Emergency Medicine | Admitting: Emergency Medicine

## 2015-02-24 ENCOUNTER — Emergency Department (HOSPITAL_COMMUNITY)
Admission: EM | Admit: 2015-02-24 | Discharge: 2015-02-24 | Disposition: A | Discharge status: Home / Self Care | Payer: Medicare Other | Source: Home / Self Care | Attending: Emergency Medicine | Admitting: Emergency Medicine

## 2015-02-24 ENCOUNTER — Encounter (HOSPITAL_COMMUNITY): Payer: Self-pay | Admitting: Emergency Medicine

## 2015-02-24 DIAGNOSIS — M545 Low back pain, unspecified: Secondary | ICD-10-CM

## 2015-02-24 DIAGNOSIS — F329 Major depressive disorder, single episode, unspecified: Secondary | ICD-10-CM

## 2015-02-24 DIAGNOSIS — Z793 Long term (current) use of hormonal contraceptives: Secondary | ICD-10-CM | POA: Insufficient documentation

## 2015-02-24 DIAGNOSIS — J45909 Unspecified asthma, uncomplicated: Secondary | ICD-10-CM

## 2015-02-24 DIAGNOSIS — G250 Essential tremor: Secondary | ICD-10-CM | POA: Diagnosis not present

## 2015-02-24 DIAGNOSIS — K5900 Constipation, unspecified: Secondary | ICD-10-CM | POA: Insufficient documentation

## 2015-02-24 DIAGNOSIS — Z79899 Other long term (current) drug therapy: Secondary | ICD-10-CM

## 2015-02-24 DIAGNOSIS — R45851 Suicidal ideations: Secondary | ICD-10-CM | POA: Insufficient documentation

## 2015-02-24 DIAGNOSIS — F250 Schizoaffective disorder, bipolar type: Secondary | ICD-10-CM | POA: Insufficient documentation

## 2015-02-24 DIAGNOSIS — Z8669 Personal history of other diseases of the nervous system and sense organs: Secondary | ICD-10-CM

## 2015-02-24 DIAGNOSIS — F840 Autistic disorder: Secondary | ICD-10-CM | POA: Insufficient documentation

## 2015-02-24 DIAGNOSIS — K219 Gastro-esophageal reflux disease without esophagitis: Secondary | ICD-10-CM | POA: Insufficient documentation

## 2015-02-24 DIAGNOSIS — F419 Anxiety disorder, unspecified: Secondary | ICD-10-CM

## 2015-02-24 HISTORY — DX: Malingerer (conscious simulation): Z76.5

## 2015-02-24 LAB — COMPREHENSIVE METABOLIC PANEL
ALK PHOS: 44 U/L (ref 38–126)
ALT: 23 U/L (ref 14–54)
AST: 23 U/L (ref 15–41)
Albumin: 4.4 g/dL (ref 3.5–5.0)
Anion gap: 11 (ref 5–15)
BILIRUBIN TOTAL: 0.7 mg/dL (ref 0.3–1.2)
BUN: 9 mg/dL (ref 6–20)
CALCIUM: 9.2 mg/dL (ref 8.9–10.3)
CO2: 23 mmol/L (ref 22–32)
CREATININE: 0.68 mg/dL (ref 0.44–1.00)
Chloride: 104 mmol/L (ref 101–111)
GFR calc Af Amer: >60 mL/min (ref >60)
Glucose, Bld: 98 mg/dL (ref 65–99)
Potassium: 4 mmol/L (ref 3.5–5.1)
Sodium: 138 mmol/L (ref 135–145)
TOTAL PROTEIN: 7.6 g/dL (ref 6.5–8.1)

## 2015-02-24 LAB — CBC WITH DIFFERENTIAL/PLATELET
BASOS ABS: 0 10*3/uL (ref 0.0–0.1)
Basophils Relative: 0 %
EOS PCT: 0 %
Eosinophils Absolute: 0 10*3/uL (ref 0.0–0.7)
HCT: 39.2 % (ref 36.0–46.0)
Hemoglobin: 12.8 g/dL (ref 12.0–15.0)
LYMPHS PCT: 32 %
Lymphs Abs: 2.5 10*3/uL (ref 0.7–4.0)
MCH: 27.3 pg (ref 26.0–34.0)
MCHC: 32.7 g/dL (ref 30.0–36.0)
MCV: 83.6 fL (ref 78.0–100.0)
MONO ABS: 0.3 10*3/uL (ref 0.1–1.0)
Monocytes Relative: 4 %
Neutro Abs: 5 10*3/uL (ref 1.7–7.7)
Neutrophils Relative %: 64 %
PLATELETS: 218 10*3/uL (ref 150–400)
RBC: 4.69 MIL/uL (ref 3.87–5.11)
RDW: 12.8 % (ref 11.5–15.5)
WBC: 7.8 10*3/uL (ref 4.0–10.5)

## 2015-02-24 LAB — URINALYSIS, ROUTINE W REFLEX MICROSCOPIC
Bilirubin Urine: NEGATIVE
GLUCOSE, UA: NEGATIVE mg/dL
Hgb urine dipstick: NEGATIVE
Ketones, ur: NEGATIVE mg/dL
LEUKOCYTES UA: NEGATIVE
Nitrite: NEGATIVE
PH: 5.5 (ref 5.0–8.0)
Protein, ur: NEGATIVE mg/dL
Specific Gravity, Urine: 1.014 (ref 1.005–1.030)

## 2015-02-24 LAB — ETHANOL

## 2015-02-24 MED ORDER — TRAMADOL HCL 50 MG PO TABS: 50.0000 mg | ORAL_TABLET | Freq: Four times a day (QID) | ORAL | Status: DC | PRN

## 2015-02-24 MED ORDER — IBUPROFEN 800 MG PO TABS
800.0000 mg | ORAL_TABLET | Freq: Once | ORAL | Status: AC
  Administered 2015-02-24: 800 mg via ORAL
  Filled 2015-02-24: qty 1

## 2015-02-24 MED ORDER — ACETAMINOPHEN 325 MG PO TABS
650.0000 mg | ORAL_TABLET | Freq: Once | ORAL | Status: AC
  Administered 2015-02-24: 650 mg via ORAL
  Filled 2015-02-24: qty 2

## 2015-02-24 MED ORDER — IBUPROFEN 200 MG PO TABS: 400.0000 mg | ORAL_TABLET | Freq: Three times a day (TID) | ORAL | Status: AC

## 2015-02-24 NOTE — ED Notes (Signed)
Pt denies being suicidal, IVC paperwork rescinded. Pt advised to come back to ED if symptoms worsen.

## 2015-02-24 NOTE — Discharge Instructions (Signed)
As discussed, your evaluation today has been largely reassuring.  But, it is important that you monitor your condition carefully, and do not hesitate to return to the ED if you develop new, or concerning changes in your condition. ? ?Otherwise, please follow-up with your physician for appropriate ongoing care. ? ?

## 2015-02-24 NOTE — ED Provider Notes (Signed)
CSN: [id number]     Arrival date & time 02/24/15  0246 History   First MD Initiated Contact with Patient 02/24/15 0355     Chief Complaint  Patient presents with  . Back Pain     (Consider location/radiation/quality/duration/timing/severity/associated sxs/prior Treatment) HPI   The patient has a past medical history of drug-seeking behavior, acid reflux, chronic constipation, autism, schizoaffective disorder, depression and asthma.  She comes the ER today complaining of right lower back pain that has been persisting for the past 2 weeks. She denies taking any medications at home because she said she cannot have any medicines in her house for fear that she may attempt to overdose on the medications. She denies having any fall or inciting factors. She has had a small amount of burning with urination, decreased urination. She reports pain worsens with walking and movement, as well as touching her back. She has not had any nausea, vomiting, diarrhea, fevers, abdominal pain, vaginal symptoms, chest pain or shortness of breath, fatigue or weakness (focal or generalized).  Past Medical History  Diagnosis Date  . Asthma   . Depression   . Schizoaffective disorder, bipolar type ([place])   . Autism   . Essential tremor   . Chronic constipation   . Acid reflux   . Drug-seeking behavior    Past Surgical History  Procedure Laterality Date  . Mouth surgery     Family History  Problem Relation Age of Onset  . Mental illness Father   . PDD Brother   . Seizures Brother    Social History  Substance Use Topics  . Smoking status: Never Smoker   . Smokeless tobacco: None  . Alcohol Use: No   OB History    No data available     Review of Systems  Review of Systems All other systems negative except as documented in the HPI. All pertinent positives and negatives as reviewed in the HPI.   Allergies  Coconut flavor; Geodon; Haloperidol and related; Lithium; Oxycodone; Prilosec; Seroquel; and  Thorazine  Home Medications   Prior to Admission medications   Medication Sig Start Date End Date Taking? Authorizing Provider  cholecalciferol (VITAMIN D-400) 400 UNITS TABS tablet Take 800 Units by mouth daily.    Historical Provider, MD  cyanocobalamin 500 MCG tablet Take 2,000 mcg by mouth daily.    Historical Provider, MD  docusate sodium (COLACE) 100 MG capsule Take 1 capsule (100 mg total) by mouth at bedtime. Patient taking differently: Take 100 mg by mouth every morning.  03/29/14   Niel Hummer, NP  ibuprofen (ADVIL,MOTRIN) 200 MG tablet Take 19,200 mg by mouth once as needed (Overdose.).    Historical Provider, MD  levonorgestrel-ethinyl estradiol (AVIANE,ALESSE,LESSINA) 0.1-20 MG-MCG tablet Take 1 tablet by mouth daily.    Historical Provider, MD  Multiple Vitamins-Minerals (MULTIVITAMIN) tablet Take 1 tablet by mouth daily.    Historical Provider, MD  pantoprazole (PROTONIX) 40 MG tablet Take 1 tablet (40 mg total) by mouth daily. 03/29/14   Niel Hummer, NP  prazosin (MINIPRESS) 1 MG capsule Take 1 capsule (1 mg total) by mouth at bedtime. Patient taking differently: Take 2 mg by mouth at bedtime.  03/29/14   Niel Hummer, NP  risperiDONE (RISPERDAL) 2 MG tablet Take 2 mg by mouth 2 (two) times daily.    Historical Provider, MD  senna (SENOKOT) 8.6 MG TABS tablet Take 2 tablets by mouth at bedtime.    Historical Provider, MD  sertraline (ZOLOFT) 100 MG  tablet Take 1 tablet (100 mg total) by mouth daily. 01/20/15   Delfin Gant, NP  traZODone (DESYREL) 100 MG tablet Take 1 tablet (100 mg total) by mouth at bedtime. 01/20/15   Delfin Gant, NP   BP 102/53 mmHg  Pulse 78  Temp(Src) 98 F (36.7 C) (Oral)  Resp 20  SpO2 100% Physical Exam  Constitutional: She appears well-developed and well-nourished. No distress.  HENT:  Head: Normocephalic and atraumatic.  Right Ear: Tympanic membrane and ear canal normal.  Left Ear: Tympanic membrane and ear canal normal.   Nose: Nose normal.  Mouth/Throat: Uvula is midline, oropharynx is clear and moist and mucous membranes are normal.  Eyes: Pupils are equal, round, and reactive to light.  Neck: Normal range of motion. Neck supple.  Cardiovascular: Normal rate and regular rhythm.   Pulmonary/Chest: Effort normal.  Abdominal: Soft.  No signs of abdominal distention  Musculoskeletal:  Symmetrical and physiologic strength to bilateral lower extremities.  Neurosensory function adequate to both legs Skin color is normal. Skin is warm and moist.  No step off deformity appreciated and no midline bony tenderness.  Ambulatory  No crepitus, laceration, effusion, induration, lesions Pedal pulses are symmetrical and palpable bilaterally  Tenderness to palpation of lumbar right paraspinal muscle. No pain midline of spine  Neurological: She is alert.  Acting at baseline  Skin: Skin is warm and dry. No rash noted.  Nursing note and vitals reviewed.   ED Course  Procedures (including critical care time) Labs Review Labs Reviewed  URINALYSIS, ROUTINE W REFLEX MICROSCOPIC (NOT AT [facility])    Imaging Review No results found. I have personally reviewed and evaluated these images and lab results as part of my medical decision-making.   EKG Interpretation None      MDM   Final diagnoses:  Right-sided low back pain without sciatica    Normal UA. Pt given Ibuprofen and Tylenol in the ED for pain. Advised to speak with family members about assisting with handing out medications if patient is concerned about taking too much.  Patient with back pain.  No neurological deficits and normal neuro exam.  Patient is ambulatory.  No loss of bowel or bladder control.  No concern for cauda equina.  No fever, night sweats, weight loss, h/o cancer, IVDA, no recent procedure to back. No urinary symptoms suggestive of UTI.  Supportive care and return precaution discussed. Appears safe for discharge at this time. Follow up as  indicated in discharge paperwork.      Delos Haring, PA-C 02/24/15 [place], DO 02/24/15 [phone]

## 2015-02-24 NOTE — Discharge Instructions (Signed)

## 2015-02-24 NOTE — ED Notes (Addendum)
Pt IVC'd by [facility]. IVC papers state pt called [facility] and stated she was "agitated and plans to hurt herself". IVC papers state the pt has the means to do so and has had prior suicide attempts overdosing on medication.   Pt states she is not suicidal, states she became agitated by the act team and said she was "60% sure she would hurt herself". [employer] state pt denied being suicidal. Pt denies HI, AV hallucinations. Pt has hx of PTSD, anxiety, depression and borderline personality disorder. Pt states she last attempted suicide by overdosing on ibuprofen, states "I'm not like that anymore, I'm engaged now".   Pt was seen at [facility] for back pain, states she is agitated because "they didn't do anything".

## 2015-02-24 NOTE — ED Provider Notes (Signed)
CSN: [id number]     Arrival date & time 02/24/15  1206 History   First MD Initiated Contact with Patient 02/24/15 1250     Chief Complaint  Patient presents with  . IVC     HPI  Patient presents with 2 chief concerns.  Patient chief concern 1: Suicidal ideation. The patient herself denies this complaint, states that she is frustrated, feels as though she is getting no relief for her ongoing back pain. She acknowledges a history of depression, anxiety, states that she has no intention of killing herself. She is getting married in 2 weeks.  Earlier today, after the patient had no relief from her back pain, she called a crisis line. The respondents found the patient to be depressed, with concern for suicide, the discuss her case with act team, and the patient had IVC papers taken out for suicide risk.  Patient's concern is low back pain. This is been present for about 2 weeks, occurring without clear precipitant. The pain is focally in the right lower, and mid lower back. No radiation, no abdominal pain, no urinary complaints prior Patient has not tried any occasion for relief.    Past Medical History  Diagnosis Date  . Asthma   . Depression   . Schizoaffective disorder, bipolar type ([place])   . Autism   . Essential tremor   . Chronic constipation   . Acid reflux   . Drug-seeking behavior    Past Surgical History  Procedure Laterality Date  . Mouth surgery     Family History  Problem Relation Age of Onset  . Mental illness Father   . PDD Brother   . Seizures Brother    Social History  Substance Use Topics  . Smoking status: Never Smoker   . Smokeless tobacco: None  . Alcohol Use: No   OB History    No data available     Review of Systems  Constitutional:       Per HPI, otherwise negative  HENT:       Per HPI, otherwise negative  Respiratory:       Per HPI, otherwise negative  Cardiovascular:       Per HPI, otherwise negative  Gastrointestinal: Negative  for vomiting.  Endocrine:       Negative aside from HPI  Genitourinary:       Neg aside from HPI   Musculoskeletal:       Per HPI, otherwise negative  Skin: Negative.   Neurological: Negative for syncope.  Psychiatric/Behavioral: Positive for dysphoric mood. Negative for suicidal ideas.      Allergies  Coconut flavor; Geodon; Haloperidol and related; Lithium; Oxycodone; Prilosec; Seroquel; and Thorazine  Home Medications   Prior to Admission medications   Medication Sig Start Date End Date Taking? Authorizing Provider  cholecalciferol (VITAMIN D-400) 400 UNITS TABS tablet Take 800 Units by mouth daily.   Yes Historical Provider, MD  cyanocobalamin 500 MCG tablet Take 2,000 mcg by mouth daily.   Yes Historical Provider, MD  docusate sodium (COLACE) 100 MG capsule Take 1 capsule (100 mg total) by mouth at bedtime. Patient taking differently: Take 100 mg by mouth every morning.  03/29/14  Yes Niel Hummer, NP  hydrOXYzine (VISTARIL) 25 MG capsule Take 25 mg by mouth 3 (three) times daily.   Yes Historical Provider, MD  levonorgestrel-ethinyl estradiol (AVIANE,ALESSE,LESSINA) 0.1-20 MG-MCG tablet Take 1 tablet by mouth daily.   Yes Historical Provider, MD  Multiple Vitamins-Minerals (MULTIVITAMIN) tablet Take 1 tablet  by mouth daily.   Yes Historical Provider, MD  pantoprazole (PROTONIX) 40 MG tablet Take 1 tablet (40 mg total) by mouth daily. 03/29/14  Yes Niel Hummer, NP  prazosin (MINIPRESS) 1 MG capsule Take 1 capsule (1 mg total) by mouth at bedtime. Patient taking differently: Take 2 mg by mouth at bedtime.  03/29/14  Yes Niel Hummer, NP  risperiDONE (RISPERDAL) 2 MG tablet Take 2 mg by mouth 2 (two) times daily.   Yes Historical Provider, MD  senna (SENOKOT) 8.6 MG TABS tablet Take 2 tablets by mouth at bedtime.   Yes Historical Provider, MD  sertraline (ZOLOFT) 100 MG tablet Take 1 tablet (100 mg total) by mouth daily. 01/20/15  Yes Delfin Gant, NP  traZODone (DESYREL)  100 MG tablet Take 1 tablet (100 mg total) by mouth at bedtime. 01/20/15  Yes Delfin Gant, NP  ibuprofen (ADVIL,MOTRIN) 200 MG tablet Take 2 tablets (400 mg total) by mouth 3 (three) times daily. Reported on 02/24/2015 02/24/15 02/28/15  Carmin Muskrat, MD  traMADol (ULTRAM) 50 MG tablet Take 1 tablet (50 mg total) by mouth every 6 (six) hours as needed. 02/24/15   Carmin Muskrat, MD   BP 125/80 mmHg  Pulse 86  Temp(Src) 97.9 F (36.6 C) (Oral)  Resp 20  SpO2 100% Physical Exam  Constitutional: She is oriented to person, place, and time. She appears well-developed and well-nourished. No distress.  HENT:  Head: Normocephalic and atraumatic.  Eyes: Conjunctivae and EOM are normal.  Cardiovascular: Normal rate and regular rhythm.   Pulmonary/Chest: Effort normal and breath sounds normal. No stridor. No respiratory distress.  Abdominal: She exhibits no distension.  Musculoskeletal: She exhibits no edema.  No deformity, no tenderness palpation  Neurological: She is alert and oriented to person, place, and time. She displays no atrophy and no tremor. No cranial nerve deficit or sensory deficit. She exhibits normal muscle tone. She displays no seizure activity. Coordination normal.  Skin: Skin is warm and dry.  Psychiatric: Her mood appears anxious. Thought content is not paranoid and not delusional. Cognition and memory are not impaired. She does not exhibit a depressed mood. She expresses no homicidal and no suicidal ideation. She expresses no suicidal plans and no homicidal plans.  Nursing note and vitals reviewed.   ED Course  Procedures (including critical care time) Labs Review Labs Reviewed  CBC WITH DIFFERENTIAL/PLATELET  COMPREHENSIVE METABOLIC PANEL  ETHANOL  URINE RAPID DRUG SCREEN, HOSP PERFORMED    I reviewed the patient's chart, including labs from last night, urinalysis unremarkable. Subsequently I discussed patient's case with our behavioral health colleagues. They  know the patient well, states that she is interacting at baseline, typically has episodes of frustration, with verbalized suicidal ideation, but is at minimal risk for suicide.  Patient confirms that she has no intention of hurting herself, is excited to be married in 2 weeks. IVC papers were all reversed  MDM   Final diagnoses:  Suicidal ideation  Bilateral low back pain without sciatica   Young female presents with concern of back pain, but is found to have elevated suicide risk, by outpatient providers. Here, the patient has no acknowledgment of increased suicidal thoughts, has clear expectation of being married in 2 weeks, and has low risk. Back pain is unremarkable, no evidence for urinary tract infection, acute abdomen. Patient started on a course of anti-inflammatory, will follow up with primary care.  Carmin Muskrat, MD 02/24/15 [phone]

## 2015-02-24 NOTE — ED Notes (Signed)
Pt in EMS from home reporting R lower back pain, present past 2 weeks. Pt has not tried to take OTC meds b/c pt states she will OD on them if she keeps any medications in her house. Vitals stable, Pt A/OX4

## 2015-03-12 ENCOUNTER — Emergency Department (HOSPITAL_COMMUNITY)
Admission: EM | Admit: 2015-03-12 | Discharge: 2015-03-12 | Disposition: A | Discharge status: Home / Self Care | Payer: Medicare Other | Attending: Emergency Medicine | Admitting: Emergency Medicine

## 2015-03-12 ENCOUNTER — Encounter (HOSPITAL_COMMUNITY): Payer: Self-pay | Admitting: Emergency Medicine

## 2015-03-12 DIAGNOSIS — F250 Schizoaffective disorder, bipolar type: Secondary | ICD-10-CM | POA: Diagnosis not present

## 2015-03-12 DIAGNOSIS — J45909 Unspecified asthma, uncomplicated: Secondary | ICD-10-CM | POA: Insufficient documentation

## 2015-03-12 DIAGNOSIS — F840 Autistic disorder: Secondary | ICD-10-CM | POA: Insufficient documentation

## 2015-03-12 DIAGNOSIS — K5900 Constipation, unspecified: Secondary | ICD-10-CM | POA: Insufficient documentation

## 2015-03-12 DIAGNOSIS — K219 Gastro-esophageal reflux disease without esophagitis: Secondary | ICD-10-CM | POA: Insufficient documentation

## 2015-03-12 DIAGNOSIS — Z79899 Other long term (current) drug therapy: Secondary | ICD-10-CM | POA: Diagnosis not present

## 2015-03-12 DIAGNOSIS — R6884 Jaw pain: Secondary | ICD-10-CM

## 2015-03-12 DIAGNOSIS — F329 Major depressive disorder, single episode, unspecified: Secondary | ICD-10-CM | POA: Diagnosis not present

## 2015-03-12 MED ORDER — KETOROLAC TROMETHAMINE 30 MG/ML IJ SOLN
30.0000 mg | Freq: Once | INTRAMUSCULAR | Status: DC
  Filled 2015-03-12: qty 1

## 2015-03-12 MED ORDER — CYCLOBENZAPRINE HCL 10 MG PO TABS: 10.0000 mg | ORAL_TABLET | Freq: Two times a day (BID) | ORAL | Status: DC | PRN

## 2015-03-12 MED ORDER — DIAZEPAM 5 MG/ML IJ SOLN
5.0000 mg | Freq: Once | INTRAMUSCULAR | Status: DC
  Filled 2015-03-12: qty 2

## 2015-03-12 MED ORDER — ONDANSETRON 4 MG PO TBDP
4.0000 mg | ORAL_TABLET | Freq: Once | ORAL | Status: AC
  Administered 2015-03-12: 4 mg via ORAL
  Filled 2015-03-12: qty 1

## 2015-03-12 MED ORDER — DIAZEPAM 5 MG PO TABS
5.0000 mg | ORAL_TABLET | Freq: Once | ORAL | Status: AC
  Administered 2015-03-12: 5 mg via ORAL
  Filled 2015-03-12: qty 1

## 2015-03-12 NOTE — ED Notes (Signed)
Pt c/o "throat closing up and I am out of pain meds"  Takes hydrocodone for TMJ -- has an appt on Tuesday with oral surgeon-- pt has rapid speech, continually rubbing face, states saw psychiatrist yesterday and was started on Thorazine -- taken 2 doses, not a new med -- pt has taken it in past -- states it was given for agitation.

## 2015-03-12 NOTE — Discharge Instructions (Signed)
Temporomandibular Joint Syndrome  Temporomandibular joint (TMJ) syndrome is a condition that affects the joints between your jaw and your skull. The TMJs are located near your ears and allow your jaw to open and close. These joints and the nearby muscles are involved in all movements of the jaw. People with TMJ syndrome have pain in the area of these joints and muscles. Chewing, biting, or other movements of the jaw can be difficult or painful.  TMJ syndrome can be caused by various things. In many cases, the condition is mild and goes away within a few weeks. For some people, the condition can become a long-term problem.  CAUSES  Possible causes of TMJ syndrome include:  · Grinding your teeth or clenching your jaw. Some people do this when they are under stress.  · Arthritis.  · Injury to the jaw.  · Head or neck injury.  · Teeth or dentures that are not aligned well.  In some cases, the cause of TMJ syndrome may not be known.  SIGNS AND SYMPTOMS  The most common symptom is an aching pain on the side of the head in the area of the TMJ. Other symptoms may include:  · Pain when moving your jaw, such as when chewing or biting.  · Being unable to open your jaw all the way.  · Making a clicking sound when you open your mouth.  · Headache.  · Earache.  · Neck or shoulder pain.  DIAGNOSIS  Diagnosis can usually be made based on your symptoms, your medical history, and a physical exam. Your health care provider may check the range of motion of your jaw. Imaging tests, such as X-rays or an MRI, are sometimes done. You may need to see your dentist to determine if your teeth and jaw are lined up correctly.  TREATMENT  TMJ syndrome often goes away on its own. If treatment is needed, the options may include:  · Eating soft foods and applying ice or heat.  · Medicines to relieve pain or inflammation.  · Medicines to relax the muscles.  · A splint, bite plate, or mouthpiece to prevent teeth grinding or jaw  clenching.  · Relaxation techniques or counseling to help reduce stress.  · Transcutaneous electrical nerve stimulation (TENS). This helps to relieve pain by applying an electrical current through the skin.  · Acupuncture. This is sometimes helpful to relieve pain.  · Jaw surgery. This is rarely needed.  HOME CARE INSTRUCTIONS  · Take medicines only as directed by your health care provider.  · Eat a soft diet if you are having trouble chewing.  · Apply ice to the painful area.    Put ice in a plastic bag.    Place a towel between your skin and the bag.    Leave the ice on for 20 minutes, 2-3 times a day.  · Apply a warm compress to the painful area as directed.  · Massage your jaw area and perform any jaw stretching exercises as recommended by your health care provider.  · If you were given a mouthpiece or bite plate, wear it as directed.  · Avoid foods that require a lot of chewing. Do not chew gum.  · Keep all follow-up visits as directed by your health care provider. This is important.  SEEK MEDICAL CARE IF:  · You are having trouble eating.  · You have new or worsening symptoms.  SEEK IMMEDIATE MEDICAL CARE IF:  · Your jaw locks open or   closed.     This information is not intended to replace advice given to you by your health care provider. Make sure you discuss any questions you have with your health care provider.     Document Released: 10/17/2000 Document Revised: 02/12/2014 Document Reviewed: 08/27/2013  [employer]vier Interactive Patient Education ©2016 [employer]

## 2015-03-12 NOTE — ED Provider Notes (Signed)
CSN: [id number]     Arrival date & time 03/12/15  1201 History   First MD Initiated Contact with Patient 03/12/15 1241     Chief Complaint  Patient presents with  . med check      (Consider location/radiation/quality/duration/timing/severity/associated sxs/prior Treatment) HPI  The patient is a 25 year old female with history of asthma and depression, schizoaffective disorder, chronic constipation, drug-seeking behavior, she comes to emergency room stating that she needs more pain medication because "her throat is closing up" and she is out of pain medications. She states that she first had jaw pain 4 days ago and 2 days ago she was given pain medication. She is waiting for appointment 2 days from now with an oral surgeon. She was given 4 day supply of pain medication by her doctor 2 days ago. She has come the ER stating she has out and she also complains of associated nausea. Her pain is rated 10/10 located in her right jaw. She denies any visual disturbances she denies ear pain .She states she is taking 3 stool softeners and is not eating much. Her last bowel movement was today. She denies abdominal pain, chest pain.  Past Medical History  Diagnosis Date  . Asthma   . Depression   . Schizoaffective disorder, bipolar type ([place])   . Autism   . Essential tremor   . Chronic constipation   . Acid reflux   . Drug-seeking behavior    Past Surgical History  Procedure Laterality Date  . Mouth surgery     Family History  Problem Relation Age of Onset  . Mental illness Father   . PDD Brother   . Seizures Brother    Social History  Substance Use Topics  . Smoking status: Never Smoker   . Smokeless tobacco: None  . Alcohol Use: No   OB History    No data available     Review of Systems  All other systems reviewed and are negative.     Allergies  Coconut flavor; Geodon; Haloperidol and related; Lithium; Oxycodone; Prilosec; Seroquel; and Thorazine  Home Medications   Prior  to Admission medications   Medication Sig Start Date End Date Taking? Authorizing Provider  cholecalciferol (VITAMIN D-400) 400 UNITS TABS tablet Take 800 Units by mouth daily.    Historical Provider, MD  cyanocobalamin 500 MCG tablet Take 2,000 mcg by mouth daily.    Historical Provider, MD  cyclobenzaprine (FLEXERIL) 10 MG tablet Take 1 tablet (10 mg total) by mouth 2 (two) times daily as needed for muscle spasms. 03/12/15   Delsa Grana, PA-C  docusate sodium (COLACE) 100 MG capsule Take 1 capsule (100 mg total) by mouth at bedtime. Patient taking differently: Take 100 mg by mouth every morning.  03/29/14   Niel Hummer, NP  hydrOXYzine (VISTARIL) 25 MG capsule Take 25 mg by mouth 3 (three) times daily.    Historical Provider, MD  levonorgestrel-ethinyl estradiol (AVIANE,ALESSE,LESSINA) 0.1-20 MG-MCG tablet Take 1 tablet by mouth daily.    Historical Provider, MD  Multiple Vitamins-Minerals (MULTIVITAMIN) tablet Take 1 tablet by mouth daily.    Historical Provider, MD  pantoprazole (PROTONIX) 40 MG tablet Take 1 tablet (40 mg total) by mouth daily. 03/29/14   Niel Hummer, NP  prazosin (MINIPRESS) 1 MG capsule Take 1 capsule (1 mg total) by mouth at bedtime. Patient taking differently: Take 2 mg by mouth at bedtime.  03/29/14   Niel Hummer, NP  risperiDONE (RISPERDAL) 2 MG tablet Take 2 mg  by mouth 2 (two) times daily.    Historical Provider, MD  senna (SENOKOT) 8.6 MG TABS tablet Take 2 tablets by mouth at bedtime.    Historical Provider, MD  sertraline (ZOLOFT) 100 MG tablet Take 1 tablet (100 mg total) by mouth daily. 01/20/15   Delfin Gant, NP  traZODone (DESYREL) 100 MG tablet Take 1 tablet (100 mg total) by mouth at bedtime. 01/20/15   Delfin Gant, NP   BP 117/71 mmHg  Pulse 93  Temp(Src) 98.8 F (37.1 C) (Oral)  Resp 16  Ht 5\' 4"  (1.626 m)  Wt 81.647 kg  BMI 30.88 kg/m2  SpO2 99% Physical Exam  Constitutional: She is oriented to person, place, and time. She appears  well-developed and well-nourished. No distress.  HENT:  Head: Normocephalic and atraumatic.    Right Ear: Hearing and external ear normal.  Left Ear: Hearing and external ear normal.  Nose: Nose normal.  Mouth/Throat: Uvula is midline, oropharynx is clear and moist and mucous membranes are normal. No trismus in the jaw. No oropharyngeal exudate or posterior oropharyngeal edema.  Eyes: Conjunctivae and EOM are normal. Pupils are equal, round, and reactive to light. Right eye exhibits no discharge. Left eye exhibits no discharge. No scleral icterus.  Neck: Normal range of motion. Neck supple. No JVD present. No tracheal deviation present.  Cardiovascular: Normal rate and regular rhythm.   Pulmonary/Chest: Effort normal and breath sounds normal. No stridor. No tachypnea. No respiratory distress. She has no decreased breath sounds.  Musculoskeletal: Normal range of motion. She exhibits no edema.  Lymphadenopathy:    She has no cervical adenopathy.  Neurological: She is alert and oriented to person, place, and time. She exhibits normal muscle tone. Coordination normal.  Skin: Skin is warm and dry. No rash noted. She is not diaphoretic. No erythema. No pallor.  Psychiatric: She has a normal mood and affect. Judgment and thought content normal. She is hyperactive.  Nursing note and vitals reviewed.   ED Course  Procedures (including critical care time) Labs Review Labs Reviewed - No data to display  Imaging Review No results found. I have personally reviewed and evaluated these images and lab results as part of my medical decision-making.   EKG Interpretation None      MDM   25 year old female, seeking pain medication for reported TMJ 2 days ago she was given a four-day supply Patient is able to open and close mouth when speaking however when I attempted to palpate her TMJ and she clenched her jaw shut and would not open and close it.   I discussed with the patient regarding the  four-day supply of narcotic pain medication, was concerning that it was gone in 2 days. She states that she "just want to get rid of the pain."  She stopped taking NSAIDs and apparently did not have any other treatments.   I ordered Valium and Toradol shot with Zofran for her nausea She refused all injections, and by mouth Valium was ordered. She was discharged with muscle relaxers and encouraged to contact her regular doctor.  I did not feel comfortable prescribing her any further narcotic pain medication, as she has a doctor mentation on her chart of drug seeking behavior, and she admits to not taking medicines as prescribed. I explained this to the patient and encouraged her to contact her PCP.  Patient was discharged in stable condition  Final diagnoses:  Jaw pain       Delsa Grana, PA-C 03/12/15  [id number]  Forde Dandy, MD 03/12/15 [phone]

## 2015-03-23 DIAGNOSIS — R070 Pain in throat: Secondary | ICD-10-CM | POA: Diagnosis not present

## 2015-03-29 DIAGNOSIS — R6884 Jaw pain: Secondary | ICD-10-CM | POA: Diagnosis not present

## 2015-04-01 ENCOUNTER — Emergency Department (HOSPITAL_COMMUNITY): Payer: Medicare Other

## 2015-04-01 ENCOUNTER — Encounter (HOSPITAL_COMMUNITY): Payer: Self-pay | Admitting: Emergency Medicine

## 2015-04-01 ENCOUNTER — Emergency Department (HOSPITAL_COMMUNITY)
Admission: EM | Admit: 2015-04-01 | Discharge: 2015-04-02 | Disposition: A | Discharge status: Home / Self Care | Payer: Medicare Other | Attending: Emergency Medicine | Admitting: Emergency Medicine

## 2015-04-01 DIAGNOSIS — Z8719 Personal history of other diseases of the digestive system: Secondary | ICD-10-CM | POA: Insufficient documentation

## 2015-04-01 DIAGNOSIS — R0600 Dyspnea, unspecified: Secondary | ICD-10-CM | POA: Diagnosis not present

## 2015-04-01 DIAGNOSIS — J45909 Unspecified asthma, uncomplicated: Secondary | ICD-10-CM

## 2015-04-01 DIAGNOSIS — Z79899 Other long term (current) drug therapy: Secondary | ICD-10-CM | POA: Insufficient documentation

## 2015-04-01 DIAGNOSIS — F329 Major depressive disorder, single episode, unspecified: Secondary | ICD-10-CM | POA: Diagnosis not present

## 2015-04-01 DIAGNOSIS — F419 Anxiety disorder, unspecified: Secondary | ICD-10-CM | POA: Diagnosis not present

## 2015-04-01 DIAGNOSIS — F840 Autistic disorder: Secondary | ICD-10-CM | POA: Insufficient documentation

## 2015-04-01 DIAGNOSIS — R079 Chest pain, unspecified: Secondary | ICD-10-CM | POA: Diagnosis present

## 2015-04-01 LAB — BASIC METABOLIC PANEL
Anion gap: 10 (ref 5–15)
BUN: 8 mg/dL (ref 6–20)
CALCIUM: 9.5 mg/dL (ref 8.9–10.3)
CHLORIDE: 107 mmol/L (ref 101–111)
CO2: 25 mmol/L (ref 22–32)
CREATININE: 0.62 mg/dL (ref 0.44–1.00)
GFR calc Af Amer: >60 mL/min (ref >60)
GFR calc non Af Amer: >60 mL/min (ref >60)
GLUCOSE: 91 mg/dL (ref 65–99)
POTASSIUM: 3.8 mmol/L (ref 3.5–5.1)
SODIUM: 142 mmol/L (ref 135–145)

## 2015-04-01 LAB — CBC
HEMATOCRIT: 39.3 % (ref 36.0–46.0)
Hemoglobin: 12.7 g/dL (ref 12.0–15.0)
MCH: 27.2 pg (ref 26.0–34.0)
MCHC: 32.3 g/dL (ref 30.0–36.0)
MCV: 84.2 fL (ref 78.0–100.0)
Platelets: 266 10*3/uL (ref 150–400)
RBC: 4.67 MIL/uL (ref 3.87–5.11)
RDW: 13.1 % (ref 11.5–15.5)
WBC: 8.5 10*3/uL (ref 4.0–10.5)

## 2015-04-01 LAB — I-STAT TROPONIN, ED: Troponin i, poc: 0 ng/mL (ref 0.00–0.08)

## 2015-04-01 MED ORDER — ALBUTEROL SULFATE HFA 108 (90 BASE) MCG/ACT IN AERS
1.0000 | INHALATION_SPRAY | Freq: Once | RESPIRATORY_TRACT | Status: AC
Start: 2015-04-01 — End: 2015-04-01
  Administered 2015-04-01: 2 via RESPIRATORY_TRACT
  Filled 2015-04-01: qty 6.7

## 2015-04-01 NOTE — ED Notes (Signed)
Pt went to [facility] for CP since [place] and got really bad yesterday., and SOB, Pt also c/o R nipple pain for a week. Pt also c/o easily bruising 3-4 times a day. Pt states she had a xray of her chest today at [facility]. Pt appears jittery and unable to hold still. Pt states "I want everything checked out for my heart".

## 2015-04-01 NOTE — ED Provider Notes (Signed)
CSN: [id number]     Arrival date & time 04/01/15  1724 History   First MD Initiated Contact with Patient 04/01/15 2106     Chief Complaint  Patient presents with  . Chest Pain   HPI   25 year old female presents today with complaints of asthma. Patient reports over the last several years she had shortness of breath, with associated chest tightness. She reports this is only present when doing physical activity, reports it's associated with dry nonproductive cough. She reports at baseline she has no shortness of breath, no chest pain. She was given inhaler at one point which improved her symptoms, but has not followed up with primary care for reevaluation. Presently patient denies any chest pain, shortness of breath, fever, chills, cough, lower extremity swelling or edema, estrogen therapy, or any other significant risk factors for infectious, cardiac, PE or DVT.   Patient also reports right breast pain that has been present over the last week. She reports it's sharp in nature, located at the top breast down to her nipple. She denies any redness, swelling, edema, nodules, discharge from the nipple. No trauma noted.    Past Medical History  Diagnosis Date  . Asthma   . Depression   . Schizoaffective disorder, bipolar type ([place])   . Autism   . Essential tremor   . Chronic constipation   . Acid reflux   . Drug-seeking behavior    Past Surgical History  Procedure Laterality Date  . Mouth surgery     Family History  Problem Relation Age of Onset  . Mental illness Father   . PDD Brother   . Seizures Brother    Social History  Substance Use Topics  . Smoking status: Never Smoker   . Smokeless tobacco: None  . Alcohol Use: No   OB History    No data available     Review of Systems  All other systems reviewed and are negative.  Allergies  Coconut flavor; Geodon; Haloperidol and related; Lithium; Oxycodone; Seroquel; Prilosec; and Thorazine  Home Medications   Prior to  Admission medications   Medication Sig Start Date End Date Taking? Authorizing Provider  acetaminophen (TYLENOL) 500 MG tablet Take 1,000 mg by mouth every 6 (six) hours as needed for moderate pain.   Yes Historical Provider, MD  celecoxib (CELEBREX) 200 MG capsule Take 200 mg by mouth daily.   Yes Historical Provider, MD  chlorproMAZINE (THORAZINE) 25 MG tablet Take 25 mg by mouth 4 (four) times daily as needed (for agitation).   Yes Historical Provider, MD  cholecalciferol (VITAMIN D-400) 400 UNITS TABS tablet Take 800 Units by mouth daily.   Yes Historical Provider, MD  cyanocobalamin 2000 MCG tablet Take 2,000 mcg by mouth daily.   Yes Historical Provider, MD  cyclobenzaprine (FLEXERIL) 10 MG tablet Take 1 tablet (10 mg total) by mouth 2 (two) times daily as needed for muscle spasms. 03/12/15  Yes Delsa Grana, PA-C  docusate sodium (COLACE) 100 MG capsule Take 1 capsule (100 mg total) by mouth at bedtime. Patient taking differently: Take 100 mg by mouth every morning.  03/29/14  Yes Niel Hummer, NP  Multiple Vitamins-Minerals (MULTIVITAMIN) tablet Take 1 tablet by mouth daily.   Yes Historical Provider, MD  senna (SENOKOT) 8.6 MG TABS tablet Take 2 tablets by mouth at bedtime.   Yes Historical Provider, MD  sertraline (ZOLOFT) 100 MG tablet Take 1 tablet (100 mg total) by mouth daily. 01/20/15  Yes Delfin Gant, NP  traZODone (DESYREL) 100 MG tablet Take 1 tablet (100 mg total) by mouth at bedtime. Patient taking differently: Take 100 mg by mouth at bedtime as needed for sleep.  01/20/15  Yes Delfin Gant, NP  pantoprazole (PROTONIX) 40 MG tablet Take 1 tablet (40 mg total) by mouth daily. 03/29/14   Niel Hummer, NP  prazosin (MINIPRESS) 1 MG capsule Take 1 capsule (1 mg total) by mouth at bedtime. Patient taking differently: Take 2 mg by mouth at bedtime.  03/29/14   Niel Hummer, NP   BP 103/67 mmHg  Pulse 102  Temp(Src) 98.9 F (37.2 C) (Oral)  Resp 24  SpO2 100%    Physical Exam  Constitutional: She is oriented to person, place, and time. She appears well-developed and well-nourished.  Patient appears anxious with grinding of the teeth and rapid movements of the fingers  HENT:  Head: Normocephalic and atraumatic.  Eyes: Conjunctivae are normal. Pupils are equal, round, and reactive to light. Right eye exhibits no discharge. Left eye exhibits no discharge. No scleral icterus.  Neck: Normal range of motion. No JVD present. No tracheal deviation present.  Cardiovascular: Regular rhythm, normal heart sounds and intact distal pulses.  Exam reveals no gallop and no friction rub.   No murmur heard. Pulmonary/Chest: Effort normal and breath sounds normal. No stridor. No respiratory distress. She has no wheezes. She has no rales. She exhibits no tenderness. Right breast exhibits tenderness. Right breast exhibits no inverted nipple, no mass, no nipple discharge and no skin change.    Musculoskeletal: Normal range of motion. She exhibits no edema or tenderness.  Neurological: She is alert and oriented to person, place, and time. Coordination normal.  Psychiatric: She has a normal mood and affect. Her behavior is normal. Judgment and thought content normal.  Nursing note and vitals reviewed.   ED Course  Procedures (including critical care time) Labs Review Labs Reviewed  BASIC METABOLIC PANEL  [place], ED    Imaging Review Dg Chest 2 View  04/01/2015  CLINICAL DATA:  25 year old presenting with left-sided chest pain over the past week or so, headache excessive bruising over the past 3 weeks, and acute onset of nausea and vomiting 2 days ago. Former smoker. EXAM: CHEST  2 VIEW COMPARISON:  01/16/2015 and earlier. FINDINGS: Cardiomediastinal silhouette unremarkable, unchanged. Mild central peribronchial thickening, unchanged. Lungs otherwise clear. No localized airspace consolidation. No pleural effusions. No pneumothorax. Normal pulmonary  vascularity. Visualized bony thorax intact. IMPRESSION: Stable mild changes of asthma and/or bronchitis. No acute cardiopulmonary disease. Electronically Signed   By: Evangeline Dakin M.D.   On: 04/01/2015 18:25   I have personally reviewed and evaluated these images and lab results as part of my medical decision-making.   EKG Interpretation   Date/Time:  Friday April 01 2015 17:35:25 EST Ventricular Rate:  102 PR Interval:  122 QRS Duration: 78 QT Interval:  350 QTC Calculation: 456 R Axis:   37 Text Interpretation:  Sinus tachycardia Possible Lateral infarct , age  undetermined Inferior infarct , age undetermined Abnormal ECG Confirmed by  Hazle Coca [phone]) on 04/01/2015 5:50:29 PM      MDM   Final diagnoses:  Asthma, unspecified asthma severity, uncomplicated    Labs: BMP, CBC and i-STAT troponin- no significant findings Imaging: Chest 2 view  Consults:  Therapeutics: Albuterol  Discharge Meds:   Assessment/Plan: [age] female presents with complaints of chest pain and shortness of breath. This is been going on for several  years, she is currently asymptomatic, this is likely asthma. She is afebrile, nontoxic, she has reassuring vital signs, no significant findings on laboratory analysis or physical exam that would necessitate further evaluation and management here in the ED. I have very low suspicion for pulmonary embolism, ACS, or infectious etiology. Patient appears very anxious, she is jittery, grinding her teeth and speaking very rapidly, I have suspicion for drug use in this patient. Patient denies complaints of right-sided breast pain. She has no significant findings on physical exam. Patient will be referred to her primary care provider for reevaluation of breast tenderness with likely GYN follow-up,  and asthma. She is given strict return precautions, verbalized understanding and agreement to today's plan and had no further questions or concerns at time of  discharge.        Okey Regal, PA-C 04/01/15 [id number]  Quintella Reichert, MD 04/04/15 [phone]

## 2015-04-01 NOTE — Discharge Instructions (Signed)
Asthma, Adult Asthma is a condition of the lungs in which the airways tighten and narrow. Asthma can make it hard to breathe. Asthma cannot be cured, but medicine and lifestyle changes can help control it. Asthma may be started (triggered) by:  Animal skin flakes (dander).  Dust.  Cockroaches.  Pollen.  Mold.  Smoke.  Cleaning products.  Hair sprays or aerosol sprays.  Paint fumes or strong smells.  Cold air, weather changes, and winds.  Crying or laughing hard.  Stress.  Certain medicines or drugs.  Foods, such as dried fruit, potato chips, and sparkling grape juice.  Infections or conditions (colds, flu).  Exercise.  Certain medical conditions or diseases.  Exercise or tiring activities. HOME CARE   Take medicine as told by your doctor.  Use a peak flow meter as told by your doctor. A peak flow meter is a tool that measures how well the lungs are working.  Record and keep track of the peak flow meter's readings.  Understand and use the asthma action plan. An asthma action plan is a written plan for taking care of your asthma and treating your attacks.  To help prevent asthma attacks:  Do not smoke. Stay away from secondhand smoke.  Change your heating and air conditioning filter often.  Limit your use of fireplaces and wood stoves.  Get rid of pests (such as roaches and mice) and their droppings.  Throw away plants if you see mold on them.  Clean your floors. Dust regularly. Use cleaning products that do not smell.  Have someone vacuum when you are not home. Use a vacuum cleaner with a HEPA filter if possible.  Replace carpet with wood, tile, or vinyl flooring. Carpet can trap animal skin flakes and dust.  Use allergy-proof pillows, mattress covers, and box spring covers.  Wash bed sheets and blankets every week in hot water and dry them in a dryer.  Use blankets that are made of polyester or cotton.  Clean bathrooms and kitchens with bleach.  If possible, have someone repaint the walls in these rooms with mold-resistant paint. Keep out of the rooms that are being cleaned and painted.  Wash hands often. GET HELP IF:  You have make a whistling sound when breaking (wheeze), have shortness of breath, or have a cough even if taking medicine to prevent attacks.  The colored mucus you cough up (sputum) is thicker than usual.  The colored mucus you cough up changes from clear or white to yellow, green, gray, or bloody.  You have problems from the medicine you are taking such as:  A rash.  Itching.  Swelling.  Trouble breathing.  You need reliever medicines more than 2-3 times a week.  Your peak flow measurement is still at 50-79% of your personal best after following the action plan for 1 hour.  You have a fever. GET HELP RIGHT AWAY IF:   You seem to be worse and are not responding to medicine during an asthma attack.  You are short of breath even at rest.  You get short of breath when doing very little activity.  You have trouble eating, drinking, or talking.  You have chest pain.  You have a fast heartbeat.  Your lips or fingernails start to turn blue.  You are light-headed, dizzy, or faint.  Your peak flow is less than 50% of your personal best.   This information is not intended to replace advice given to you by your health care provider. Make sure   you discuss any questions you have with your health care provider.   Document Released: 07/11/2007 Document Revised: 10/13/2014 Document Reviewed: 08/21/2012 Elsevier Interactive Patient Education 2016 [employer]  

## 2015-04-11 ENCOUNTER — Emergency Department (HOSPITAL_COMMUNITY)
Admission: EM | Admit: 2015-04-11 | Discharge: 2015-04-11 | Discharge status: Against Medical Advice | Payer: Medicare Other | Attending: Emergency Medicine | Admitting: Emergency Medicine

## 2015-04-11 ENCOUNTER — Encounter (HOSPITAL_COMMUNITY): Payer: Self-pay | Admitting: Emergency Medicine

## 2015-04-11 ENCOUNTER — Emergency Department (HOSPITAL_COMMUNITY)
Admission: EM | Admit: 2015-04-11 | Discharge: 2015-04-11 | Disposition: A | Discharge status: Home / Self Care | Payer: Medicare Other | Attending: Emergency Medicine | Admitting: Emergency Medicine

## 2015-04-11 ENCOUNTER — Encounter (HOSPITAL_COMMUNITY): Payer: Self-pay | Admitting: *Deleted

## 2015-04-11 ENCOUNTER — Emergency Department (HOSPITAL_COMMUNITY): Payer: Medicare Other

## 2015-04-11 DIAGNOSIS — R412 Retrograde amnesia: Secondary | ICD-10-CM | POA: Insufficient documentation

## 2015-04-11 DIAGNOSIS — Y9389 Activity, other specified: Secondary | ICD-10-CM | POA: Insufficient documentation

## 2015-04-11 DIAGNOSIS — F840 Autistic disorder: Secondary | ICD-10-CM | POA: Diagnosis not present

## 2015-04-11 DIAGNOSIS — R112 Nausea with vomiting, unspecified: Secondary | ICD-10-CM

## 2015-04-11 DIAGNOSIS — J45909 Unspecified asthma, uncomplicated: Secondary | ICD-10-CM | POA: Diagnosis not present

## 2015-04-11 DIAGNOSIS — K5900 Constipation, unspecified: Secondary | ICD-10-CM | POA: Diagnosis not present

## 2015-04-11 DIAGNOSIS — Z791 Long term (current) use of non-steroidal anti-inflammatories (NSAID): Secondary | ICD-10-CM | POA: Diagnosis not present

## 2015-04-11 DIAGNOSIS — R000 Tachycardia, unspecified: Secondary | ICD-10-CM | POA: Diagnosis not present

## 2015-04-11 DIAGNOSIS — F259 Schizoaffective disorder, unspecified: Secondary | ICD-10-CM | POA: Diagnosis not present

## 2015-04-11 DIAGNOSIS — Z7982 Long term (current) use of aspirin: Secondary | ICD-10-CM | POA: Insufficient documentation

## 2015-04-11 DIAGNOSIS — F250 Schizoaffective disorder, bipolar type: Secondary | ICD-10-CM | POA: Insufficient documentation

## 2015-04-11 DIAGNOSIS — K219 Gastro-esophageal reflux disease without esophagitis: Secondary | ICD-10-CM | POA: Diagnosis not present

## 2015-04-11 DIAGNOSIS — J4520 Mild intermittent asthma, uncomplicated: Secondary | ICD-10-CM | POA: Diagnosis not present

## 2015-04-11 DIAGNOSIS — W1839XA Other fall on same level, initial encounter: Secondary | ICD-10-CM | POA: Insufficient documentation

## 2015-04-11 DIAGNOSIS — R55 Syncope and collapse: Secondary | ICD-10-CM | POA: Insufficient documentation

## 2015-04-11 DIAGNOSIS — Y998 Other external cause status: Secondary | ICD-10-CM | POA: Diagnosis not present

## 2015-04-11 DIAGNOSIS — R410 Disorientation, unspecified: Secondary | ICD-10-CM | POA: Diagnosis not present

## 2015-04-11 DIAGNOSIS — Z79899 Other long term (current) drug therapy: Secondary | ICD-10-CM | POA: Insufficient documentation

## 2015-04-11 DIAGNOSIS — R51 Headache: Secondary | ICD-10-CM | POA: Diagnosis not present

## 2015-04-11 DIAGNOSIS — S060X1A Concussion with loss of consciousness of 30 minutes or less, initial encounter: Secondary | ICD-10-CM | POA: Diagnosis not present

## 2015-04-11 DIAGNOSIS — Y9289 Other specified places as the place of occurrence of the external cause: Secondary | ICD-10-CM | POA: Diagnosis not present

## 2015-04-11 DIAGNOSIS — F329 Major depressive disorder, single episode, unspecified: Secondary | ICD-10-CM | POA: Insufficient documentation

## 2015-04-11 DIAGNOSIS — S060X0A Concussion without loss of consciousness, initial encounter: Secondary | ICD-10-CM | POA: Diagnosis not present

## 2015-04-11 DIAGNOSIS — S0990XA Unspecified injury of head, initial encounter: Secondary | ICD-10-CM | POA: Diagnosis present

## 2015-04-11 DIAGNOSIS — K210 Gastro-esophageal reflux disease with esophagitis: Secondary | ICD-10-CM | POA: Diagnosis not present

## 2015-04-11 LAB — CBC
HEMATOCRIT: 37.4 % (ref 36.0–46.0)
HEMOGLOBIN: 12.2 g/dL (ref 12.0–15.0)
MCH: 27.2 pg (ref 26.0–34.0)
MCHC: 32.6 g/dL (ref 30.0–36.0)
MCV: 83.3 fL (ref 78.0–100.0)
Platelets: 232 10*3/uL (ref 150–400)
RBC: 4.49 MIL/uL (ref 3.87–5.11)
RDW: 13.5 % (ref 11.5–15.5)
WBC: 7 10*3/uL (ref 4.0–10.5)

## 2015-04-11 LAB — BASIC METABOLIC PANEL
Anion gap: 9 (ref 5–15)
BUN: 5 mg/dL — AB (ref 6–20)
CHLORIDE: 108 mmol/L (ref 101–111)
CO2: 23 mmol/L (ref 22–32)
Calcium: 9.3 mg/dL (ref 8.9–10.3)
Creatinine, Ser: 0.64 mg/dL (ref 0.44–1.00)
GFR calc Af Amer: >60 mL/min (ref >60)
GFR calc non Af Amer: >60 mL/min (ref >60)
GLUCOSE: 90 mg/dL (ref 65–99)
POTASSIUM: 3.7 mmol/L (ref 3.5–5.1)
Sodium: 140 mmol/L (ref 135–145)

## 2015-04-11 MED ORDER — KETOROLAC TROMETHAMINE 30 MG/ML IJ SOLN
30.0000 mg | Freq: Once | INTRAMUSCULAR | Status: DC
  Filled 2015-04-11: qty 1

## 2015-04-11 MED ORDER — METOCLOPRAMIDE HCL 5 MG/ML IJ SOLN
10.0000 mg | INTRAMUSCULAR | Status: DC
  Filled 2015-04-11: qty 2

## 2015-04-11 MED ORDER — IBUPROFEN 200 MG PO TABS: 400.0000 mg | ORAL_TABLET | Freq: Once | ORAL | Status: DC

## 2015-04-11 NOTE — ED Notes (Addendum)
Place a high fall band on (r) arm.both side rail are up.

## 2015-04-11 NOTE — ED Notes (Signed)
Pt states she rolled OOB and fell on the hardwood floor around 2:55am.  States she woke up once she fell but then "passed out" for approx 1 min.  C/o "knot" on R side of head.  Denies neck and back pain.  MAE without difficulty.

## 2015-04-11 NOTE — ED Provider Notes (Signed)
CSN: [id number]     Arrival date & time 04/11/15  1502 History   First MD Initiated Contact with Patient 04/11/15 2001     Chief Complaint  Patient presents with  . vomiting, confusion      (Consider location/radiation/quality/duration/timing/severity/associated sxs/prior Treatment) HPI Comments: 25 year old female with a history of asthma, depression, schizoaffective disorder, bipolar type, autism, essential tremor, and drug-seeking behavior presents to the emergency department 12 hours following ED discharge for complaints of persistent nausea and vomiting as well as confusion. She was evaluated in the emergency department this past evening after a fall. Workup included a negative head CT. Patient now presents to the ED via EMS and states that she has felt nauseous ever since she was discharged. She reports 4-5 episodes of nonbloody emesis prior to arrival. She denies taking any medications for her symptoms. Her last episode of emesis was 4 hours ago. She also has noticed some increased confusion. She reports that she cannot remember her address or birth date, though she stated her name and correct date of birth with nursing on arrival. Patient is also able to give me a detailed depiction of what contributed to her fall this past evening. She has had no repeat syncope or fall since discharge. She states that her "face feels hot", though she has had no fever.  The history is provided by the patient. No language interpreter was used.    Past Medical History  Diagnosis Date  . Asthma   . Depression   . Schizoaffective disorder, bipolar type ([place])   . Autism   . Essential tremor   . Chronic constipation   . Acid reflux   . Drug-seeking behavior    Past Surgical History  Procedure Laterality Date  . Mouth surgery     Family History  Problem Relation Age of Onset  . Mental illness Father   . PDD Brother   . Seizures Brother    Social History  Substance Use Topics  . Smoking status:  Never Smoker   . Smokeless tobacco: None  . Alcohol Use: No   OB History    No data available      Review of Systems  Constitutional: Negative for fever.  HENT: Negative for trouble swallowing.   Gastrointestinal: Positive for nausea and vomiting.  Neurological: Positive for headaches. Negative for weakness.  Ten systems reviewed and are negative for acute change, except as noted in the HPI.    Allergies  Coconut flavor; Geodon; Haloperidol and related; Lithium; Oxycodone; Seroquel; Prilosec; and Thorazine  Home Medications   Prior to Admission medications   Medication Sig Start Date End Date Taking? Authorizing Provider  acetaminophen (TYLENOL) 500 MG tablet Take 1,000 mg by mouth every 6 (six) hours as needed for moderate pain.    Historical Provider, MD  albuterol (PROVENTIL HFA;VENTOLIN HFA) 108 (90 Base) MCG/ACT inhaler Inhale 1-2 puffs into the lungs every 6 (six) hours as needed for wheezing or shortness of breath.    Historical Provider, MD  aspirin-acetaminophen-caffeine (PAMPRIN MAX) [phone] MG tablet Take 2 tablets by mouth every 6 (six) hours as needed for headache or migraine.    Historical Provider, MD  carbamazepine (TEGRETOL XR) 200 MG 12 hr tablet Take 200-400 mg by mouth 2 (two) times daily. 200mg  in the morning and 400mg  in the evening    Historical Provider, MD  celecoxib (CELEBREX) 200 MG capsule Take 200 mg by mouth daily.    Historical Provider, MD  chlorproMAZINE (THORAZINE) 25 MG tablet  Take 25 mg by mouth 4 (four) times daily as needed (for agitation).    Historical Provider, MD  cholecalciferol (VITAMIN D-400) 400 UNITS TABS tablet Take 800 Units by mouth daily.    Historical Provider, MD  cyanocobalamin 2000 MCG tablet Take 2,000 mcg by mouth daily.    Historical Provider, MD  docusate sodium (COLACE) 100 MG capsule Take 1 capsule (100 mg total) by mouth at bedtime. Patient taking differently: Take 100 mg by mouth every morning.  03/29/14   Niel Hummer, NP  Multiple Vitamins-Minerals (MULTIVITAMIN) tablet Take 1 tablet by mouth daily.    Historical Provider, MD  pantoprazole (PROTONIX) 40 MG tablet Take 1 tablet (40 mg total) by mouth daily. 03/29/14   Niel Hummer, NP  prazosin (MINIPRESS) 1 MG capsule Take 1 capsule (1 mg total) by mouth at bedtime. Patient taking differently: Take 2 mg by mouth at bedtime.  03/29/14   Niel Hummer, NP  senna (SENOKOT) 8.6 MG TABS tablet Take 2 tablets by mouth at bedtime.    Historical Provider, MD  sertraline (ZOLOFT) 100 MG tablet Take 1 tablet (100 mg total) by mouth daily. Patient taking differently: Take 50 mg by mouth daily.  01/20/15   Delfin Gant, NP  tiZANidine (ZANAFLEX) 4 MG tablet Take 4 mg by mouth every 6 (six) hours as needed for muscle spasms.    Historical Provider, MD  traZODone (DESYREL) 100 MG tablet Take 1 tablet (100 mg total) by mouth at bedtime. Patient taking differently: Take 100 mg by mouth at bedtime as needed for sleep.  01/20/15   Delfin Gant, NP   BP 137/73 mmHg  Pulse 88  Temp(Src) 98.8 F (37.1 C) (Oral)  Resp 18  SpO2 100%  LMP 04/11/2015   Physical Exam  Constitutional: She is oriented to person, place, and time. She appears well-developed and well-nourished. No distress.  Nontoxic/nonseptic appearing  HENT:  Head: Normocephalic and atraumatic.  Mouth/Throat: Oropharynx is clear and moist. No oropharyngeal exudate.  Symmetric rise of the uvula with phonation. No battle's sign or raccoon's eyes. No skull instability or hemotympanum.  Eyes: Conjunctivae and EOM are normal. Pupils are equal, round, and reactive to light. No scleral icterus.  Neck: Normal range of motion.  No nuchal rigidity or meningismus.  Cardiovascular: Normal rate, regular rhythm and intact distal pulses.   Pulmonary/Chest: Effort normal. No respiratory distress. She has no wheezes.  Respirations even and unlabored  Musculoskeletal: Normal range of motion.  Neurological:  She is alert and oriented to person, place, and time. No cranial nerve deficit or sensory deficit. She exhibits normal muscle tone. Coordination normal. GCS eye subscore is 4. GCS verbal subscore is 5. GCS motor subscore is 6.  GCS 15. Speech is goal oriented. No cranial nerve deficits appreciated; symmetric eyebrow raise, no facial drooping, tongue midline. Patient with sensation to light touch intact, though she reports subjective decreased sensation in the RUE and RLE. She has 4/5 strength against resistance in the RUE and RLE which is suspected to be secondary to poor effort as patient balancing her entire body weight on her RLE when asked to ambulate. Patient ambulatory, unassisted, with antalgic gait.  Skin: Skin is warm and dry. No rash noted. She is not diaphoretic. No erythema. No pallor.  Psychiatric: She has a normal mood and affect. Her speech is rapid and/or pressured. She is hyperactive.  Nursing note and vitals reviewed.   ED Course  Procedures (including critical care time)  Labs Review Labs Reviewed  CBC  BASIC METABOLIC PANEL  URINE RAPID DRUG SCREEN, HOSP PERFORMED  PREGNANCY, URINE  URINALYSIS, ROUTINE W REFLEX MICROSCOPIC (NOT AT [facility])    Imaging Review Ct Head Wo Contrast  04/11/2015  CLINICAL DATA:  Fall last Thursday, then again this morning striking right-sided head. EXAM: CT HEAD WITHOUT CONTRAST TECHNIQUE: Contiguous axial images were obtained from the base of the skull through the vertex without intravenous contrast. COMPARISON:  03/15/2014; 03/12/2009 FINDINGS: Regional soft tissues appear normal with special attention paid to the right side of the head. No radiopaque foreign body. No displaced calvarial fracture. Gray-white differentiation is maintained. No CT evidence of acute large territory infarct. No intraparenchymal or extra-axial mass or hemorrhage. Normal size a configuration of the ventricles and basilar cisterns. No midline shift. There is underpneumatization  the bilateral frontal sinuses. Otherwise, normal appearance of the paranasal sinuses and mastoid air cells. No air-fluid levels. IMPRESSION: Negative noncontrast head CT. Electronically Signed   By: Sandi Mariscal M.D.   On: 04/11/2015 07:39   I have personally reviewed and evaluated these images and lab results as part of my medical decision-making.   EKG Interpretation None      9:00 PM Notified by nurse that patient refusing IV or medications because she wants to "go home" because she is "tired of being in hospitals all day". Notified nurse that patient may leave AMA if she chooses to do so.  9:50 PM Notified by nurse that patient left the ED; continued to refuse treatment. See nursing documentation. MDM   Final diagnoses:  Non-intractable vomiting with nausea, vomiting of unspecified type    Patient eloped the ED after presenting for N/V and reported confusion following a fall yesterday. Nonfocal neurologic exam today. Head CT from 12 hours ago reviewed, negative for acute findings. Patient refusing additional care in the ED after evaluation by this writer. Per nurse, patient also refused to sign AMA papers.   Filed Vitals:   04/11/15 1503 04/11/15 1519 04/11/15 1951  BP:  123/74 137/73  Pulse:  100 88  Temp:  98.5 F (36.9 C) 98.8 F (37.1 C)  TempSrc:  Oral   Resp:  18 18  SpO2: 100% 99% 100%     Antonietta Breach, PA-C 04/11/15 2154  Malvin Johns, MD 04/11/15 2158

## 2015-04-11 NOTE — ED Notes (Addendum)
Patient here by EMS with ongoing nausea, vomiting and reports she is confused. Seen this am and had negative CT, patient answers to her name and states correct date of birth. No active vomiting on arrival. PERRLA.

## 2015-04-11 NOTE — ED Notes (Signed)
Pt refusing IV medication; requesting to "go home, I've been in hospitals all day, I'm just ready to go."

## 2015-04-11 NOTE — ED Notes (Signed)
Pt called out requesting to leave; Pt fully dressed, boyfriend at bedside. Pt refused to sign AMA

## 2015-04-11 NOTE — ED Notes (Signed)
MD at bedside. 

## 2015-04-11 NOTE — ED Provider Notes (Signed)
CSN: [id number]     Arrival date & time 04/11/15  0355 History   First MD Initiated Contact with Patient 04/11/15 [phone]     Chief Complaint  Patient presents with  . Fall     (Consider location/radiation/quality/duration/timing/severity/associated sxs/prior Treatment) Patient is a 25 y.o. female presenting with fall and headaches.  Fall Associated symptoms include headaches.  Headache Pain location:  R temporal Quality:  Sharp and dull Radiates to:  Does not radiate Severity currently:  5/10 Severity at highest:  5/10 Onset quality:  Gradual Timing:  Constant Progression:  Worsening Chronicity:  New Similar to prior headaches: no   Context: not caffeine, not defecating, not eating, not loud noise and not straining   Relieved by:  None tried Worsened by:  Nothing Ineffective treatments:  None tried Associated symptoms: syncope   Associated symptoms: no back pain, no eye pain, no facial pain, no fatigue and no fever     Past Medical History  Diagnosis Date  . Asthma   . Depression   . Schizoaffective disorder, bipolar type ([place])   . Autism   . Essential tremor   . Chronic constipation   . Acid reflux   . Drug-seeking behavior    Past Surgical History  Procedure Laterality Date  . Mouth surgery     Family History  Problem Relation Age of Onset  . Mental illness Father   . PDD Brother   . Seizures Brother    Social History  Substance Use Topics  . Smoking status: Never Smoker   . Smokeless tobacco: None  . Alcohol Use: No   OB History    No data available     Review of Systems  Constitutional: Negative for fever and fatigue.  Eyes: Negative for pain.  Cardiovascular: Positive for syncope.  Musculoskeletal: Negative for back pain.  Neurological: Positive for headaches.       Retrograde amnesia  All other systems reviewed and are negative.     Allergies  Coconut flavor; Geodon; Haloperidol and related; Lithium; Oxycodone; Seroquel; Prilosec; and  Thorazine  Home Medications   Prior to Admission medications   Medication Sig Start Date End Date Taking? Authorizing Provider  acetaminophen (TYLENOL) 500 MG tablet Take 1,000 mg by mouth every 6 (six) hours as needed for moderate pain.   Yes Historical Provider, MD  albuterol (PROVENTIL HFA;VENTOLIN HFA) 108 (90 Base) MCG/ACT inhaler Inhale 1-2 puffs into the lungs every 6 (six) hours as needed for wheezing or shortness of breath.   Yes Historical Provider, MD  aspirin-acetaminophen-caffeine (PAMPRIN MAX) [phone] MG tablet Take 2 tablets by mouth every 6 (six) hours as needed for headache or migraine.   Yes Historical Provider, MD  carbamazepine (TEGRETOL XR) 200 MG 12 hr tablet Take 200-400 mg by mouth 2 (two) times daily. 200mg  in the morning and 400mg  in the evening   Yes Historical Provider, MD  celecoxib (CELEBREX) 200 MG capsule Take 200 mg by mouth daily.   Yes Historical Provider, MD  chlorproMAZINE (THORAZINE) 25 MG tablet Take 25 mg by mouth 4 (four) times daily as needed (for agitation).   Yes Historical Provider, MD  cholecalciferol (VITAMIN D-400) 400 UNITS TABS tablet Take 800 Units by mouth daily.   Yes Historical Provider, MD  cyanocobalamin 2000 MCG tablet Take 2,000 mcg by mouth daily.   Yes Historical Provider, MD  docusate sodium (COLACE) 100 MG capsule Take 1 capsule (100 mg total) by mouth at bedtime. Patient taking differently: Take 100 mg by  mouth every morning.  03/29/14  Yes Niel Hummer, NP  Multiple Vitamins-Minerals (MULTIVITAMIN) tablet Take 1 tablet by mouth daily.   Yes Historical Provider, MD  pantoprazole (PROTONIX) 40 MG tablet Take 1 tablet (40 mg total) by mouth daily. 03/29/14  Yes Niel Hummer, NP  prazosin (MINIPRESS) 1 MG capsule Take 1 capsule (1 mg total) by mouth at bedtime. Patient taking differently: Take 2 mg by mouth at bedtime.  03/29/14  Yes Niel Hummer, NP  senna (SENOKOT) 8.6 MG TABS tablet Take 2 tablets by mouth at bedtime.   Yes  Historical Provider, MD  sertraline (ZOLOFT) 100 MG tablet Take 1 tablet (100 mg total) by mouth daily. Patient taking differently: Take 50 mg by mouth daily.  01/20/15  Yes Delfin Gant, NP  tiZANidine (ZANAFLEX) 4 MG tablet Take 4 mg by mouth every 6 (six) hours as needed for muscle spasms.   Yes Historical Provider, MD  traZODone (DESYREL) 100 MG tablet Take 1 tablet (100 mg total) by mouth at bedtime. Patient taking differently: Take 100 mg by mouth at bedtime as needed for sleep.  01/20/15  Yes Delfin Gant, NP   BP 109/69 mmHg  Pulse 85  Temp(Src) 97.9 F (36.6 C) (Oral)  Resp 16  SpO2 98%  LMP 04/11/2015 Physical Exam  Constitutional: She is oriented to person, place, and time. She appears well-developed and well-nourished.  HENT:  Head: Normocephalic and atraumatic.  Neck: Normal range of motion.  Cardiovascular: Normal rate and regular rhythm.   Pulmonary/Chest: Effort normal and breath sounds normal. No stridor. No respiratory distress.  Abdominal: Soft. She exhibits no distension. There is no tenderness. There is no rebound.  Musculoskeletal: Normal range of motion. She exhibits no edema or tenderness.  Neurological: She is alert and oriented to person, place, and time.  No altered mental status, able to give full seemingly accurate history.  Face is symmetric, EOM's intact, pupils equal and reactive, vision intact, tongue and uvula midline without deviation Upper and Lower extremity motor 5/5, intact pain perception in distal extremities, 2+ reflexes in biceps, patella and achilles tendons. Finger to nose normal, heel to shin normal.  Skin: Skin is warm and dry. No rash noted. No erythema.  Nursing note and vitals reviewed.   ED Course  Procedures (including critical care time) Labs Review Labs Reviewed - No data to display  Imaging Review Ct Head Wo Contrast  04/11/2015  CLINICAL DATA:  Fall last Thursday, then again this morning striking right-sided  head. EXAM: CT HEAD WITHOUT CONTRAST TECHNIQUE: Contiguous axial images were obtained from the base of the skull through the vertex without intravenous contrast. COMPARISON:  03/15/2014; 03/12/2009 FINDINGS: Regional soft tissues appear normal with special attention paid to the right side of the head. No radiopaque foreign body. No displaced calvarial fracture. Gray-white differentiation is maintained. No CT evidence of acute large territory infarct. No intraparenchymal or extra-axial mass or hemorrhage. Normal size a configuration of the ventricles and basilar cisterns. No midline shift. There is underpneumatization the bilateral frontal sinuses. Otherwise, normal appearance of the paranasal sinuses and mastoid air cells. No air-fluid levels. IMPRESSION: Negative noncontrast head CT. Electronically Signed   By: Sandi Mariscal M.D.   On: 04/11/2015 07:39   I have personally reviewed and evaluated these images and lab results as part of my medical decision-making.   EKG Interpretation None      MDM   Final diagnoses:  Concussion, with loss of consciousness of 30  minutes or less, initial encounter   Fall with syncope, persistent headache. Normal neuro exam. Also with some retrograde amnesia, will CT but likely has a concussion. Head ct negative. Neuro exam unchaged. Likely concussion.     Merrily Pew, MD 04/11/15 1505

## 2015-04-11 NOTE — ED Notes (Signed)
Patient exhibits extreme fidgety behavior and is talking non-stop. Pt is very concerned about the spot on her head from falling out of bed. No visible injury or hematoma noted. Patient keeps pressing on it hard and reports tenderness. Told patient to stop pressing down on it and take some deep breaths. Patient took one deep breath and began to freak out again. Pt then went on to say that she has bipolar disorder, PTSD, and panic disorder and is on many medications.Marland KitchenMarland Kitchen

## 2015-04-11 NOTE — ED Notes (Signed)
Patient has returned from being out of the department, patient placed back on continuous pulse oximetry and blood pressure cuff; visitor at bedside asleep in chair

## 2015-04-12 ENCOUNTER — Ambulatory Visit (INDEPENDENT_AMBULATORY_CARE_PROVIDER_SITE_OTHER): Payer: Medicare Other | Admitting: Family Medicine

## 2015-04-12 VITALS — BP 119/74 | HR 111 | Temp 98.2°F | Resp 18 | Ht 64.0 in

## 2015-04-12 DIAGNOSIS — W19XXXA Unspecified fall, initial encounter: Secondary | ICD-10-CM

## 2015-04-12 DIAGNOSIS — R569 Unspecified convulsions: Secondary | ICD-10-CM | POA: Diagnosis not present

## 2015-04-12 MED ORDER — MELOXICAM 15 MG PO TABS: 15.0000 mg | ORAL_TABLET | Freq: Every day | ORAL | Status: DC

## 2015-04-12 NOTE — Assessment & Plan Note (Signed)
She does not have many S/Sx c/w true seizures/epilepsy.  However, she does state she had bladder loss x 3 with 3 different times.  CT scan in ED last night was negative.  Will refer to neuro for EEG and w/u but again doubt this is true seizure disorder.  No driving until cleared by neurology with Sx free period with explained to pt which she understands.

## 2015-04-12 NOTE — Progress Notes (Signed)
[name] is a 25 y.o. female who presents today for fall and possible seizures.    Fall - One week ago, fell out of bed when having a nightmare.  Denies inability to walk or gait abnormality/limp.  No bruising or hitting her head at this time.  No previous injury or x-rays.   Possible seizures - She states this has been ongoing now for 3 days now after she hit her head on the floor from falling out of bed again.  She was seen in the ED yesterday and had CT scan which was completely negative at that time.  Her episodes do include bowel/bladder loss, but no tongue biting, generalized convulsion.  Has never had before and states she does remember how long they last.  No post-ictal problems.    Past Medical History  Diagnosis Date  . Asthma   . Depression   . Schizoaffective disorder, bipolar type ([place])   . Autism   . Essential tremor   . Chronic constipation   . Acid reflux   . Drug-seeking behavior   . Seizures ([place])     History  Smoking status  . Never Smoker   Smokeless tobacco  . Not on file    Family History  Problem Relation Age of Onset  . Mental illness Father   . PDD Brother   . Seizures Brother     Current Outpatient Prescriptions on File Prior to Visit  Medication Sig Dispense Refill  . acetaminophen (TYLENOL) 500 MG tablet Take 1,000 mg by mouth every 6 (six) hours as needed for moderate pain.    Marland Kitchen albuterol (PROVENTIL HFA;VENTOLIN HFA) 108 (90 Base) MCG/ACT inhaler Inhale 1-2 puffs into the lungs every 6 (six) hours as needed for wheezing or shortness of breath.    . carbamazepine (TEGRETOL XR) 200 MG 12 hr tablet Take 200-400 mg by mouth 2 (two) times daily. 200mg  in the morning and 400mg  in the evening    . celecoxib (CELEBREX) 200 MG capsule Take 200 mg by mouth daily.    . chlorproMAZINE (THORAZINE) 25 MG tablet Take 25 mg by mouth 4 (four) times daily as needed (for agitation).    . cholecalciferol (VITAMIN D-400) 400 UNITS TABS tablet Take 800 Units by  mouth daily.    . cyanocobalamin 2000 MCG tablet Take 2,000 mcg by mouth daily.    Marland Kitchen docusate sodium (COLACE) 100 MG capsule Take 1 capsule (100 mg total) by mouth at bedtime. (Patient taking differently: Take 100 mg by mouth every morning. ) 10 capsule 0  . Multiple Vitamins-Minerals (MULTIVITAMIN) tablet Take 1 tablet by mouth daily.    . pantoprazole (PROTONIX) 40 MG tablet Take 1 tablet (40 mg total) by mouth daily.    . prazosin (MINIPRESS) 1 MG capsule Take 1 capsule (1 mg total) by mouth at bedtime. (Patient taking differently: Take 2 mg by mouth at bedtime. )    . senna (SENOKOT) 8.6 MG TABS tablet Take 2 tablets by mouth at bedtime.    . sertraline (ZOLOFT) 100 MG tablet Take 1 tablet (100 mg total) by mouth daily. (Patient taking differently: Take 50 mg by mouth daily. ) 60 tablet 0  . tiZANidine (ZANAFLEX) 4 MG tablet Take 4 mg by mouth every 6 (six) hours as needed for muscle spasms.    . traZODone (DESYREL) 100 MG tablet Take 1 tablet (100 mg total) by mouth at bedtime. (Patient taking differently: Take 100 mg by mouth at bedtime as needed for sleep. )  30 tablet 60  . aspirin-acetaminophen-caffeine (PAMPRIN MAX) [id number] MG tablet Take 2 tablets by mouth every 6 (six) hours as needed for headache or migraine. Reported on 04/12/2015     No current facility-administered medications on file prior to visit.    ROS: Per HPI.  All other systems reviewed and are negative.   Physical Exam Filed Vitals:   04/12/15 1537  BP: 119/74  Pulse: 111  Temp: 98.2 F (36.8 C)  Resp: 18    Physical Examination: General appearance - anxious and NAD Oral: No evidence of oral lacerations or tongue damage.  Teeth intact, MMM  Mental status - anxious Chest - clear to auscultation, no wheezes, rales or rhonchi, symmetric air entry Heart - normal rate and regular rhythm, no murmurs noted    Chemistry      Component Value Date/Time   NA 140 04/11/2015 2058   K 3.7 04/11/2015 2058   CL 108  04/11/2015 2058   CO2 23 04/11/2015 2058   BUN 5* 04/11/2015 2058   CREATININE 0.64 04/11/2015 2058      Component Value Date/Time   CALCIUM 9.3 04/11/2015 2058   ALKPHOS 44 02/24/2015 1310   AST 23 02/24/2015 1310   ALT 23 02/24/2015 1310   BILITOT 0.7 02/24/2015 1310      Lab Results  Component Value Date   WBC 7.0 04/11/2015   HGB 12.2 04/11/2015   HCT 37.4 04/11/2015   MCV 83.3 04/11/2015   PLT 232 04/11/2015   Lab Results  Component Value Date   TSH 1.096 03/12/2014   No results found for: HGBA1C

## 2015-04-12 NOTE — Assessment & Plan Note (Signed)
No red flags or TTP along bony prominences of L side.  She does not have pain with F/E or any hip movement itself.  Contusion likely explanation, as well as conservative management appropriate  No imaging needed today.

## 2015-04-15 ENCOUNTER — Encounter (HOSPITAL_COMMUNITY): Payer: Self-pay

## 2015-04-15 ENCOUNTER — Ambulatory Visit (INDEPENDENT_AMBULATORY_CARE_PROVIDER_SITE_OTHER): Payer: Medicare Other | Admitting: Family Medicine

## 2015-04-15 ENCOUNTER — Emergency Department (HOSPITAL_COMMUNITY): Payer: Medicare Other

## 2015-04-15 ENCOUNTER — Emergency Department (HOSPITAL_COMMUNITY)
Admission: EM | Admit: 2015-04-15 | Discharge: 2015-04-15 | Disposition: A | Discharge status: Home / Self Care | Payer: Medicare Other | Attending: Emergency Medicine | Admitting: Emergency Medicine

## 2015-04-15 VITALS — BP 120/82 | HR 106 | Temp 97.9°F | Resp 20 | Ht 65.0 in | Wt 175.0 lb

## 2015-04-15 DIAGNOSIS — F329 Major depressive disorder, single episode, unspecified: Secondary | ICD-10-CM | POA: Diagnosis not present

## 2015-04-15 DIAGNOSIS — R51 Headache: Secondary | ICD-10-CM | POA: Insufficient documentation

## 2015-04-15 DIAGNOSIS — Z79899 Other long term (current) drug therapy: Secondary | ICD-10-CM | POA: Diagnosis not present

## 2015-04-15 DIAGNOSIS — F250 Schizoaffective disorder, bipolar type: Secondary | ICD-10-CM | POA: Diagnosis not present

## 2015-04-15 DIAGNOSIS — R112 Nausea with vomiting, unspecified: Secondary | ICD-10-CM | POA: Diagnosis not present

## 2015-04-15 DIAGNOSIS — F209 Schizophrenia, unspecified: Secondary | ICD-10-CM | POA: Insufficient documentation

## 2015-04-15 DIAGNOSIS — J45909 Unspecified asthma, uncomplicated: Secondary | ICD-10-CM | POA: Insufficient documentation

## 2015-04-15 DIAGNOSIS — R29898 Other symptoms and signs involving the musculoskeletal system: Secondary | ICD-10-CM | POA: Diagnosis not present

## 2015-04-15 DIAGNOSIS — R413 Other amnesia: Secondary | ICD-10-CM

## 2015-04-15 DIAGNOSIS — F840 Autistic disorder: Secondary | ICD-10-CM | POA: Insufficient documentation

## 2015-04-15 DIAGNOSIS — H5319 Other subjective visual disturbances: Secondary | ICD-10-CM | POA: Diagnosis not present

## 2015-04-15 DIAGNOSIS — F919 Conduct disorder, unspecified: Secondary | ICD-10-CM | POA: Diagnosis not present

## 2015-04-15 DIAGNOSIS — H53149 Visual discomfort, unspecified: Secondary | ICD-10-CM

## 2015-04-15 DIAGNOSIS — G44301 Post-traumatic headache, unspecified, intractable: Secondary | ICD-10-CM | POA: Diagnosis not present

## 2015-04-15 DIAGNOSIS — Z7951 Long term (current) use of inhaled steroids: Secondary | ICD-10-CM | POA: Insufficient documentation

## 2015-04-15 DIAGNOSIS — S0990XD Unspecified injury of head, subsequent encounter: Secondary | ICD-10-CM

## 2015-04-15 DIAGNOSIS — R519 Headache, unspecified: Secondary | ICD-10-CM

## 2015-04-15 DIAGNOSIS — F05 Delirium due to known physiological condition: Secondary | ICD-10-CM | POA: Diagnosis not present

## 2015-04-15 DIAGNOSIS — Z791 Long term (current) use of non-steroidal anti-inflammatories (NSAID): Secondary | ICD-10-CM | POA: Insufficient documentation

## 2015-04-15 LAB — COMPREHENSIVE METABOLIC PANEL
ALBUMIN: 4.4 g/dL (ref 3.5–5.0)
ALT: 23 U/L (ref 14–54)
AST: 22 U/L (ref 15–41)
Alkaline Phosphatase: 52 U/L (ref 38–126)
Anion gap: 13 (ref 5–15)
CHLORIDE: 106 mmol/L (ref 101–111)
CO2: 24 mmol/L (ref 22–32)
Calcium: 9.6 mg/dL (ref 8.9–10.3)
Creatinine, Ser: 0.67 mg/dL (ref 0.44–1.00)
GFR calc Af Amer: >60 mL/min (ref >60)
GFR calc non Af Amer: >60 mL/min (ref >60)
GLUCOSE: 93 mg/dL (ref 65–99)
POTASSIUM: 4 mmol/L (ref 3.5–5.1)
SODIUM: 143 mmol/L (ref 135–145)
Total Bilirubin: 0.4 mg/dL (ref 0.3–1.2)
Total Protein: 6.6 g/dL (ref 6.5–8.1)

## 2015-04-15 LAB — CBC WITH DIFFERENTIAL/PLATELET
BASOS ABS: 0 10*3/uL (ref 0.0–0.1)
BASOS PCT: 0 %
EOS PCT: 0 %
Eosinophils Absolute: 0 10*3/uL (ref 0.0–0.7)
HCT: 38.4 % (ref 36.0–46.0)
Hemoglobin: 12.5 g/dL (ref 12.0–15.0)
Lymphocytes Relative: 37 %
Lymphs Abs: 2.3 10*3/uL (ref 0.7–4.0)
MCH: 27.6 pg (ref 26.0–34.0)
MCHC: 32.6 g/dL (ref 30.0–36.0)
MCV: 84.8 fL (ref 78.0–100.0)
MONO ABS: 0.3 10*3/uL (ref 0.1–1.0)
Monocytes Relative: 5 %
Neutro Abs: 3.6 10*3/uL (ref 1.7–7.7)
Neutrophils Relative %: 58 %
PLATELETS: 212 10*3/uL (ref 150–400)
RBC: 4.53 MIL/uL (ref 3.87–5.11)
RDW: 13.7 % (ref 11.5–15.5)
WBC: 6.3 10*3/uL (ref 4.0–10.5)

## 2015-04-15 MED ORDER — METOCLOPRAMIDE HCL 5 MG/ML IJ SOLN
10.0000 mg | Freq: Once | INTRAMUSCULAR | Status: AC
  Administered 2015-04-15: 10 mg via INTRAVENOUS
  Filled 2015-04-15: qty 2

## 2015-04-15 MED ORDER — DIPHENHYDRAMINE HCL 50 MG/ML IJ SOLN
25.0000 mg | Freq: Once | INTRAMUSCULAR | Status: AC
  Administered 2015-04-15: 25 mg via INTRAVENOUS
  Filled 2015-04-15: qty 1

## 2015-04-15 MED ORDER — SODIUM CHLORIDE 0.9 % IV BOLUS (SEPSIS)
1000.0000 mL | Freq: Once | INTRAVENOUS | Status: AC
  Administered 2015-04-15: 1000 mL via INTRAVENOUS

## 2015-04-15 NOTE — ED Notes (Signed)
Pt was complaining repeatedly about her headache, "It's been hurting for 6 days, just getting worse.  Feels like someone hit my head with a sledgehammer."  Pt kept repeating these statements over and over until she was in a panicked and hostile state.  This EMT brought a blanket since pt was exposing herself when writhing on her bed in the hallway. Pt was using her sheet to cover her eyes. This EMT also offered an ice pack for her forehead but the pt snapped back that, "We tried it at home and it didn't work."  This EMT gave pt space, and returned 5 min later with a second offer of an ice pack for her neck and pt accepted midstream of her almost manic complaints and then continued.  This EMT returned with the ice pack, and informed pt that calming down by not talking may help reduce her pulse and hopefully her pain.  Pt complied and is still lying quietly and still.  Pt's husband shared that she is improving.

## 2015-04-15 NOTE — Progress Notes (Addendum)
Subjective:  By signing my name below, I, Raven Small, attest that this documentation has been prepared under the direction and in the presence of Merri Ray, MD.  Electronically Signed: Thea Alken, ED Scribe. 04/15/2015. 1:43 PM.   Patient ID: [name], female    DOB: 1990-08-16, 25 y.o.   MRN: [id number]  HPI Chief Complaint  Patient presents with  . Headache    patient states has had head injury concussion now with headache  . Insomnia    patient states she can only sleep when she has sex    HPI Comments: [name] is a 25 y.o. female who presents to the [facility] complaining of a headache. She was seen here 3 days ago after fall 1 week prior. At that time complained of poss seizure from a repeat fall. Did have associated bowel and bladder incontinence but no tongue biting. Was seen 4 days in ED. CT brain on 3/6 was negative. She was referred to neuro for EEG and work up. appointment still pending for neuro eval. She has hx of schizoaffective disorder, bipolar based on ED note 4 days ago. She was seen in Ed twice that day. Second visit was by transport due to nausea and increased confusion since being discharged that day. She refused IV meds, refused treatment and left AMA refusing to sign AMA papers.     Pt states she's had progressively worsening HA with associated photophobia since fall 6 days ago . She states she was diagnosed with a concussion at ED. She also reports loss of memory after for 3 days after fall which she states has worsened as well as confusion, leg weakness, nausea and worsening vomiting. Pt states vomiting is due to medication change of tegretol, managed by ACt team. She has been taking tylenol 500 mg twice a day and ice pack without relief to HA. Pt denies convulsion or seizure. She denies hx of HA and migraines. Pt also noted that she had an additional fall 4 days ago while in the shower. During this time she hit her head again but is  unable to recall object of impaction. Her husband helped her at that time and he denies pt having LOC at that time.   She denies illicit drug use.     Patient Active Problem List   Diagnosis Date Noted  . Convulsion ([place]) 04/12/2015  . Fall 04/12/2015  . Suicidal ideation   . Homicidal ideation   . Suicidal ideations 06/23/2014  . Prolonged QT syndrome   . EKG abnormality 03/12/2014  . Schizoaffective disorder, bipolar type ([place]) 03/10/2014  . Borderline personality disorder 03/10/2014  . PTSD (post-traumatic stress disorder) 03/10/2014  . Asperger's syndrome 03/10/2014   Past Medical History  Diagnosis Date  . Asthma   . Depression   . Schizoaffective disorder, bipolar type ([place])   . Autism   . Essential tremor   . Chronic constipation   . Acid reflux   . Drug-seeking behavior   . Seizures [facility])    Past Surgical History  Procedure Laterality Date  . Mouth surgery     Allergies  Allergen Reactions  . Coconut Flavor Anaphylaxis  . Geodon [Ziprasidone Hcl] Other (See Comments)    Paralysis of mouth  . Haloperidol And Related Other (See Comments)    Paralysis of mouth   . Lithium Other (See Comments)    Seizure like muscle spasms   . Oxycodone Other (See Comments)    hallucinations   .  Seroquel [Quetiapine Fumarate] Other (See Comments)    TOO STRONG  . Prilosec [Omeprazole] Nausea And Vomiting  . Percocet [Oxycodone-Acetaminophen]   . Thorazine [Chlorpromazine] Palpitations    Currently taking med-PRN   Prior to Admission medications   Medication Sig Start Date End Date Taking? Authorizing Provider  acetaminophen (TYLENOL) 500 MG tablet Take 1,000 mg by mouth every 6 (six) hours as needed for moderate pain.   Yes Historical Provider, MD  albuterol (PROVENTIL HFA;VENTOLIN HFA) 108 (90 Base) MCG/ACT inhaler Inhale 1-2 puffs into the lungs every 6 (six) hours as needed for wheezing or shortness of breath.   Yes Historical Provider, MD    aspirin-acetaminophen-caffeine (PAMPRIN MAX) [phone] MG tablet Take 2 tablets by mouth every 6 (six) hours as needed for headache or migraine. Reported on 04/12/2015   Yes Historical Provider, MD  beclomethasone (QVAR) 40 MCG/ACT inhaler Inhale 1 puff into the lungs 2 (two) times daily.   Yes Historical Provider, MD  carbamazepine (TEGRETOL XR) 200 MG 12 hr tablet Take 200-400 mg by mouth 2 (two) times daily. 200mg  in the morning and 400mg  in the evening   Yes Historical Provider, MD  chlorproMAZINE (THORAZINE) 25 MG tablet Take 25 mg by mouth 4 (four) times daily as needed (for agitation).   Yes Historical Provider, MD  cyanocobalamin 2000 MCG tablet Take 2,000 mcg by mouth daily.   Yes Historical Provider, MD  docusate sodium (COLACE) 100 MG capsule Take 1 capsule (100 mg total) by mouth at bedtime. Patient taking differently: Take 100 mg by mouth every morning.  03/29/14  Yes Niel Hummer, NP  meloxicam (MOBIC) 15 MG tablet Take 1 tablet (15 mg total) by mouth daily. 04/12/15  Yes Nolon Rod, DO  Multiple Vitamins-Minerals (MULTIVITAMIN) tablet Take 1 tablet by mouth daily.   Yes Historical Provider, MD  pantoprazole (PROTONIX) 40 MG tablet Take 1 tablet (40 mg total) by mouth daily. 03/29/14  Yes Niel Hummer, NP  prazosin (MINIPRESS) 1 MG capsule Take 1 capsule (1 mg total) by mouth at bedtime. Patient taking differently: Take 2 mg by mouth at bedtime.  03/29/14  Yes Niel Hummer, NP  senna (SENOKOT) 8.6 MG TABS tablet Take 2 tablets by mouth at bedtime.   Yes Historical Provider, MD  sertraline (ZOLOFT) 100 MG tablet Take 1 tablet (100 mg total) by mouth daily. Patient taking differently: Take 50 mg by mouth daily.  01/20/15  Yes Delfin Gant, NP  tiZANidine (ZANAFLEX) 4 MG tablet Take 4 mg by mouth every 6 (six) hours as needed for muscle spasms.   Yes Historical Provider, MD  traZODone (DESYREL) 100 MG tablet Take 1 tablet (100 mg total) by mouth at bedtime. Patient taking  differently: Take 100 mg by mouth at bedtime as needed for sleep.  01/20/15  Yes Delfin Gant, NP  cholecalciferol (VITAMIN D-400) 400 UNITS TABS tablet Take 800 Units by mouth daily.    Historical Provider, MD   Social History   Social History  . Marital Status: Single    Spouse Name: N/A  . Number of Children: N/A  . Years of Education: N/A   Occupational History  . Not on file.   Social History Main Topics  . Smoking status: Never Smoker   . Smokeless tobacco: Not on file  . Alcohol Use: No  . Drug Use: No  . Sexual Activity: Yes   Other Topics Concern  . Not on file   Social History Narrative  Review of Systems  Eyes: Positive for photophobia. Negative for visual disturbance.  Gastrointestinal: Positive for nausea and vomiting.  Neurological: Positive for weakness and headaches. Negative for syncope and numbness.  Psychiatric/Behavioral: Positive for confusion.   Objective:   Physical Exam  Constitutional: She is oriented to person, place, and time. She appears well-developed and well-nourished. No distress.  HENT:  Head: Normocephalic and atraumatic.  Eyes: Conjunctivae and EOM are normal.  Unable to retract due to discomfort keeping her eyes open. Unable to check nystagmus due to discomfort.  Neck: Neck supple.  Cardiovascular: Normal rate.   Pulmonary/Chest: Effort normal.  Musculoskeletal: Normal range of motion.  No pronator drift. No focal weakness in UE.  Initially she had normal grip strength but on repeat decrease grip strength. Able to plantar flex at feet. No dorsal flex right foot.    Neurological: She is alert and oriented to person, place, and time.  Pt is aware time place and   Skin: Skin is warm and dry.  Psychiatric:  Pressure speech, fast rate of speech.  Nursing note and vitals reviewed.   Filed Vitals:   04/15/15 1332  BP: 120/82  Pulse: 106  Temp: 97.9 F (36.6 C)  Resp: 20  Height: 5\' 5"  (1.651 m)  Weight: 175 lb (79.379 kg)   SpO2: 99%   2:20 PM- EMS called for transport after consent from husband in room. Also consent for IV placement.    Assessment & Plan:  [name] is a 25 y.o. female Intractable post-traumatic headache, unspecified chronicity pattern  Nausea and vomiting, intractability of vomiting not specified, unspecified vomiting type  Right leg weakness  Photophobia  Acute confusional state  Memory difficulty  Schizoaffective disorder, bipolar type ([place])  Head injury, subsequent encounter  History of schizoaffective disorder, other past medical history per chart. Reported fall approximately 7-10 days ago, with subsequent fall 3 days ago. Initially reported loss of consciousness with initial fall and retrograde amnesia for possibly 3 days. Was evaluated through the emergency room with CT scanning that was negative for acute findings, subsequently seen here 3 days ago for convulsions. Convulsions have now ceased, but now with increasing headache, photophobia, increasing nausea and vomiting, and reported right hand and right leg weakness, along with memory difficulty and reported confusion. There were some inconsistencies with strength testing on the right hand, but with repeat testing of the right foot, did not dorsiflex against resistance or active. No pronator drift, no other focal weakness appreciated.   - At patient's request, discussed with Charna Archer (her identified legal guardian through the [facility]) on patient's cell phone.   -With above worsening symptoms, and initial head injury, then repeat head injury, will have further evaluation in the emergency room to decide if further neuroimaging necessary or further testing.    I personally performed the services described in this documentation, which was scribed in my presence. The recorded information has been reviewed and considered, and addended by me as needed.

## 2015-04-15 NOTE — ED Provider Notes (Signed)
CSN: [id number]     Arrival date & time 04/15/15  1508 History   First MD Initiated Contact with Patient 04/15/15 1511     Chief Complaint  Patient presents with  . Nausea  . Emesis     (Consider location/radiation/quality/duration/timing/severity/associated sxs/prior Treatment) HPI Comments: Patient with history of schizoaffective disorder, bipolar disorder -- presents with complaint of headache with associated photophobia, phonophobia. Patient had a fall proximal one week ago with subsequent amnesia, headache, vomiting. Patient was seen in emergency department on 04/11/15 and had a negative head CT at that time. Patient states that 3 days ago she fell again. Patient went to [facility] care today and was referred back to the emergency department with continued symptoms. They noted weakness in the lower extremities. The onset of this condition was acute. The course is constant. Aggravating factors: none. Alleviating factors: none.    Patient is a 25 y.o. female presenting with vomiting. The history is provided by the patient and medical records.  Emesis Associated symptoms: headaches   Associated symptoms: no abdominal pain, no diarrhea, no myalgias and no sore throat     Past Medical History  Diagnosis Date  . Asthma   . Depression   . Schizoaffective disorder, bipolar type ([place])   . Autism   . Essential tremor   . Chronic constipation   . Acid reflux   . Drug-seeking behavior   . Seizures [facility])    Past Surgical History  Procedure Laterality Date  . Mouth surgery     Family History  Problem Relation Age of Onset  . Mental illness Father   . PDD Brother   . Seizures Brother    Social History  Substance Use Topics  . Smoking status: Never Smoker   . Smokeless tobacco: None  . Alcohol Use: No   OB History    No data available     Review of Systems  Constitutional: Negative for fever.  HENT: Negative for congestion, dental problem, rhinorrhea, sinus pressure and  sore throat.   Eyes: Positive for photophobia. Negative for discharge, redness and visual disturbance.  Respiratory: Negative for cough and shortness of breath.   Cardiovascular: Negative for chest pain.  Gastrointestinal: Positive for nausea and vomiting. Negative for abdominal pain and diarrhea.  Genitourinary: Negative for dysuria.  Musculoskeletal: Negative for myalgias, gait problem, neck pain and neck stiffness.  Skin: Negative for rash.  Neurological: Positive for headaches. Negative for syncope, speech difficulty, weakness, light-headedness and numbness.  Psychiatric/Behavioral: Positive for behavioral problems and confusion.      Allergies  Coconut flavor; Geodon; Haloperidol and related; Lithium; Oxycodone; Seroquel; Prilosec; Percocet; and Thorazine  Home Medications   Prior to Admission medications   Medication Sig Start Date End Date Taking? Authorizing Provider  acetaminophen (TYLENOL) 500 MG tablet Take 1,000 mg by mouth every 6 (six) hours as needed for moderate pain.    Historical Provider, MD  albuterol (PROVENTIL HFA;VENTOLIN HFA) 108 (90 Base) MCG/ACT inhaler Inhale 1-2 puffs into the lungs every 6 (six) hours as needed for wheezing or shortness of breath.    Historical Provider, MD  aspirin-acetaminophen-caffeine (PAMPRIN MAX) [phone] MG tablet Take 2 tablets by mouth every 6 (six) hours as needed for headache or migraine. Reported on 04/12/2015    Historical Provider, MD  beclomethasone (QVAR) 40 MCG/ACT inhaler Inhale 1 puff into the lungs 2 (two) times daily.    Historical Provider, MD  carbamazepine (TEGRETOL XR) 200 MG 12 hr tablet Take 200-400 mg by  mouth 2 (two) times daily. 200mg  in the morning and 400mg  in the evening    Historical Provider, MD  chlorproMAZINE (THORAZINE) 25 MG tablet Take 25 mg by mouth 4 (four) times daily as needed (for agitation).    Historical Provider, MD  cholecalciferol (VITAMIN D-400) 400 UNITS TABS tablet Take 800 Units by mouth  daily.    Historical Provider, MD  cyanocobalamin 2000 MCG tablet Take 2,000 mcg by mouth daily.    Historical Provider, MD  docusate sodium (COLACE) 100 MG capsule Take 1 capsule (100 mg total) by mouth at bedtime. Patient taking differently: Take 100 mg by mouth every morning.  03/29/14   Niel Hummer, NP  meloxicam (MOBIC) 15 MG tablet Take 1 tablet (15 mg total) by mouth daily. 04/12/15   Nolon Rod, DO  Multiple Vitamins-Minerals (MULTIVITAMIN) tablet Take 1 tablet by mouth daily.    Historical Provider, MD  pantoprazole (PROTONIX) 40 MG tablet Take 1 tablet (40 mg total) by mouth daily. 03/29/14   Niel Hummer, NP  prazosin (MINIPRESS) 1 MG capsule Take 1 capsule (1 mg total) by mouth at bedtime. Patient taking differently: Take 2 mg by mouth at bedtime.  03/29/14   Niel Hummer, NP  senna (SENOKOT) 8.6 MG TABS tablet Take 2 tablets by mouth at bedtime.    Historical Provider, MD  sertraline (ZOLOFT) 100 MG tablet Take 1 tablet (100 mg total) by mouth daily. Patient taking differently: Take 50 mg by mouth daily.  01/20/15   Delfin Gant, NP  tiZANidine (ZANAFLEX) 4 MG tablet Take 4 mg by mouth every 6 (six) hours as needed for muscle spasms.    Historical Provider, MD  traZODone (DESYREL) 100 MG tablet Take 1 tablet (100 mg total) by mouth at bedtime. Patient taking differently: Take 100 mg by mouth at bedtime as needed for sleep.  01/20/15   Delfin Gant, NP   BP 113/51 mmHg  Pulse 83  Temp(Src) 98.2 F (36.8 C) (Oral)  Resp 16  SpO2 99%  LMP 04/09/2015   Physical Exam  Constitutional: She is oriented to person, place, and time. She appears well-developed and well-nourished.  HENT:  Head: Normocephalic and atraumatic.  Right Ear: Tympanic membrane, external ear and ear canal normal.  Left Ear: Tympanic membrane, external ear and ear canal normal.  Nose: Nose normal.  Mouth/Throat: Uvula is midline, oropharynx is clear and moist and mucous membranes are normal.   Eyes: Conjunctivae, EOM and lids are normal. Pupils are equal, round, and reactive to light. Right eye exhibits no nystagmus. Left eye exhibits no nystagmus.  Neck: Normal range of motion. Neck supple.  Cardiovascular: Normal rate and regular rhythm.   Pulmonary/Chest: Effort normal and breath sounds normal.  Abdominal: Soft. There is no tenderness.  Musculoskeletal:       Cervical back: She exhibits normal range of motion, no tenderness and no bony tenderness.  Neurological: She is alert and oriented to person, place, and time. She has normal strength and normal reflexes. No cranial nerve deficit or sensory deficit. She displays a negative Romberg sign. Coordination and gait normal. GCS eye subscore is 4. GCS verbal subscore is 5. GCS motor subscore is 6.  Skin: Skin is warm and dry.  Psychiatric: She has a normal mood and affect.  Nursing note and vitals reviewed.   ED Course  Procedures (including critical care time) Labs Review Labs Reviewed  COMPREHENSIVE METABOLIC PANEL - Abnormal; Notable for the following:  BUN <5 (*)    All other components within normal limits  CBC WITH DIFFERENTIAL/PLATELET    Imaging Review Ct Head Wo Contrast  04/15/2015  CLINICAL DATA:  25 year old female persistent headache, nausea and vomiting following fall and head injury 4 days ago. Initial encounter. EXAM: CT HEAD WITHOUT CONTRAST TECHNIQUE: Contiguous axial images were obtained from the base of the skull through the vertex without intravenous contrast. COMPARISON:  04/11/2015 and prior CTs FINDINGS: No intracranial abnormalities are identified, including mass lesion or mass effect, hydrocephalus, extra-axial fluid collection, midline shift, hemorrhage, or acute infarction. The visualized bony calvarium is unremarkable. IMPRESSION: Unremarkable noncontrast head CT. Electronically Signed   By: Margarette Canada M.D.   On: 04/15/2015 17:16   I have personally reviewed and evaluated these images and lab  results as part of my medical decision-making.   EKG Interpretation None       3:23 PM Patient seen and examined. Work-up initiated. Medications ordered.   Vital signs reviewed and are as follows: BP 113/51 mmHg  Pulse 83  Temp(Src) 98.2 F (36.8 C) (Oral)  Resp 16  SpO2 99%  LMP 04/09/2015  5:59 PM headache resolved after treatment. Exam is stable. CT imaging is negative for bleed. Patient is comfortable with discharge at this point. I encouraged her to follow-up with her primary care physician in the next 3 days if she continues to have symptoms of concussion including persistent headaches, nausea, balance difficulties, trouble with concentration. Patient verbalizes understanding and agrees with the plan.  MDM   Final diagnoses:  Acute nonintractable headache, unspecified headache type   Patient without high-risk features of headache including: sudden onset/thunderclap HA, no similar headache in past, altered mental status, accompanying seizure, headache with exertion, age > 65, history of immunocompromise, neck or shoulder pain, fever, use of anticoagulation, family history of spontaneous SAH, concomitant drug use, toxic exposure.   CT ordered 2/2 additional falls, vomiting and is negative. Patient has a normal complete neurological exam, normal vital signs, normal level of consciousness, no signs of meningismus, is well-appearing/non-toxic appearing, no signs of trauma.   No dangerous or life-threatening conditions suspected or identified by history, physical exam, and by work-up. No indications for hospitalization identified.     Carlisle Cater, PA-C 04/15/15 [place], MD 04/16/15 [phone]

## 2015-04-15 NOTE — ED Notes (Signed)
[facility]- pt coming from [facility] [facility] with c/o nausea vomiting after a fall on Monday. Pt cleared here with negative head CT. Pt has extensive psych hx. Vitals stable, IV in place, no episodes of vomiting.

## 2015-04-15 NOTE — Discharge Instructions (Signed)
Please read and follow all provided instructions.  Your diagnoses today include:  1. Acute nonintractable headache, unspecified headache type     Tests performed today include:  CT of your head which was normal and did not show any serious cause of your headache  Vital signs. See below for your results today.   Medications:  In the Emergency Department you received:  Reglan - antinausea/headache medication  Benadryl - antihistamine to counteract potential side effects of reglan  Take any prescribed medications only as directed.  Additional information:  Follow any educational materials contained in this packet.  You are having a headache. No specific cause was found today for your headache. It may have been a migraine or other cause of headache. Stress, anxiety, fatigue, and depression are common triggers for headaches.   Your headache today does not appear to be life-threatening or require hospitalization, but often the exact cause of headaches is not determined in the emergency department. Therefore, follow-up with your doctor is very important to find out what may have caused your headache and whether or not you need any further diagnostic testing or treatment.   Sometimes headaches can appear benign (not harmful), but then more serious symptoms can develop which should prompt an immediate re-evaluation by your doctor or the emergency department.  BE VERY CAREFUL not to take multiple medicines containing Tylenol (also called acetaminophen). Doing so can lead to an overdose which can damage your liver and cause liver failure and possibly death.   Follow-up instructions: Please follow-up with your primary care provider in the next 3 days for further evaluation of your symptoms.   Return instructions:   Please return to the Emergency Department if you experience worsening symptoms.  Return if the medications do not resolve your headache, if it recurs, or if you have multiple  episodes of vomiting or cannot keep down fluids.  Return if you have a change from the usual headache.  RETURN IMMEDIATELY IF you:  Develop a sudden, severe headache  Develop confusion or become poorly responsive or faint  Develop a fever above 100.75F or problem breathing  Have a change in speech, vision, swallowing, or understanding  Develop new weakness, numbness, tingling, incoordination in your arms or legs  Have a seizure  Please return if you have any other emergent concerns.  Additional Information:  Your vital signs today were: BP 113/51 mmHg   Pulse 83   Temp(Src) 98.2 F (36.8 C) (Oral)   Resp 16   SpO2 99%   LMP 04/09/2015 If your blood pressure (BP) was elevated above 135/85 this visit, please have this repeated by your doctor within one month. --------------

## 2015-05-04 ENCOUNTER — Encounter (HOSPITAL_COMMUNITY): Payer: Self-pay | Admitting: *Deleted

## 2015-05-04 DIAGNOSIS — J45909 Unspecified asthma, uncomplicated: Secondary | ICD-10-CM | POA: Diagnosis present

## 2015-05-04 DIAGNOSIS — Z7951 Long term (current) use of inhaled steroids: Secondary | ICD-10-CM | POA: Diagnosis not present

## 2015-05-04 DIAGNOSIS — K219 Gastro-esophageal reflux disease without esophagitis: Secondary | ICD-10-CM | POA: Insufficient documentation

## 2015-05-04 DIAGNOSIS — J45901 Unspecified asthma with (acute) exacerbation: Secondary | ICD-10-CM | POA: Diagnosis not present

## 2015-05-04 DIAGNOSIS — Z8669 Personal history of other diseases of the nervous system and sense organs: Secondary | ICD-10-CM | POA: Diagnosis not present

## 2015-05-04 DIAGNOSIS — Z791 Long term (current) use of non-steroidal anti-inflammatories (NSAID): Secondary | ICD-10-CM | POA: Insufficient documentation

## 2015-05-04 DIAGNOSIS — K5900 Constipation, unspecified: Secondary | ICD-10-CM | POA: Diagnosis not present

## 2015-05-04 DIAGNOSIS — Z79899 Other long term (current) drug therapy: Secondary | ICD-10-CM | POA: Diagnosis not present

## 2015-05-04 DIAGNOSIS — F329 Major depressive disorder, single episode, unspecified: Secondary | ICD-10-CM | POA: Diagnosis not present

## 2015-05-04 DIAGNOSIS — Z7982 Long term (current) use of aspirin: Secondary | ICD-10-CM | POA: Insufficient documentation

## 2015-05-04 DIAGNOSIS — F840 Autistic disorder: Secondary | ICD-10-CM | POA: Diagnosis not present

## 2015-05-04 MED ORDER — ALBUTEROL SULFATE (2.5 MG/3ML) 0.083% IN NEBU
5.0000 mg | INHALATION_SOLUTION | Freq: Once | RESPIRATORY_TRACT | Status: AC
  Administered 2015-05-04: 5 mg via RESPIRATORY_TRACT
  Filled 2015-05-04: qty 6

## 2015-05-04 MED ORDER — IPRATROPIUM BROMIDE 0.02 % IN SOLN
0.5000 mg | Freq: Once | RESPIRATORY_TRACT | Status: AC
  Administered 2015-05-04: 0.5 mg via RESPIRATORY_TRACT
  Filled 2015-05-04: qty 2.5

## 2015-05-04 NOTE — ED Notes (Signed)
Pt stepped outside, states "I'll be right back".

## 2015-05-04 NOTE — ED Notes (Signed)
The pt has asthma and since 1900 she has had difficulty breathing her inhaler is not helping.  She has very rapid   Speech and she is constanytly talking wihtout stopping for air  lmp march 1st

## 2015-05-05 ENCOUNTER — Encounter: Payer: Self-pay | Admitting: Family Medicine

## 2015-05-05 ENCOUNTER — Telehealth: Payer: Self-pay

## 2015-05-05 ENCOUNTER — Emergency Department (HOSPITAL_COMMUNITY)
Admission: EM | Admit: 2015-05-05 | Discharge: 2015-05-05 | Disposition: A | Discharge status: Home / Self Care | Payer: Medicare Other | Attending: Emergency Medicine | Admitting: Emergency Medicine

## 2015-05-05 ENCOUNTER — Ambulatory Visit (INDEPENDENT_AMBULATORY_CARE_PROVIDER_SITE_OTHER): Payer: Medicare Other | Admitting: Family Medicine

## 2015-05-05 VITALS — BP 124/62 | HR 105 | Temp 98.1°F | Resp 20 | Ht 65.0 in | Wt 168.0 lb

## 2015-05-05 DIAGNOSIS — F4310 Post-traumatic stress disorder, unspecified: Secondary | ICD-10-CM

## 2015-05-05 DIAGNOSIS — S0990XD Unspecified injury of head, subsequent encounter: Secondary | ICD-10-CM

## 2015-05-05 DIAGNOSIS — G43009 Migraine without aura, not intractable, without status migrainosus: Secondary | ICD-10-CM

## 2015-05-05 DIAGNOSIS — F840 Autistic disorder: Secondary | ICD-10-CM | POA: Diagnosis not present

## 2015-05-05 DIAGNOSIS — E538 Deficiency of other specified B group vitamins: Secondary | ICD-10-CM

## 2015-05-05 DIAGNOSIS — F259 Schizoaffective disorder, unspecified: Secondary | ICD-10-CM | POA: Diagnosis not present

## 2015-05-05 DIAGNOSIS — F250 Schizoaffective disorder, bipolar type: Secondary | ICD-10-CM

## 2015-05-05 DIAGNOSIS — F603 Borderline personality disorder: Secondary | ICD-10-CM

## 2015-05-05 DIAGNOSIS — G43909 Migraine, unspecified, not intractable, without status migrainosus: Secondary | ICD-10-CM

## 2015-05-05 DIAGNOSIS — H539 Unspecified visual disturbance: Secondary | ICD-10-CM

## 2015-05-05 DIAGNOSIS — F845 Asperger's syndrome: Secondary | ICD-10-CM | POA: Diagnosis not present

## 2015-05-05 DIAGNOSIS — J45901 Unspecified asthma with (acute) exacerbation: Secondary | ICD-10-CM

## 2015-05-05 DIAGNOSIS — J4542 Moderate persistent asthma with status asthmaticus: Secondary | ICD-10-CM | POA: Diagnosis not present

## 2015-05-05 DIAGNOSIS — R519 Headache, unspecified: Secondary | ICD-10-CM

## 2015-05-05 DIAGNOSIS — R51 Headache: Principal | ICD-10-CM

## 2015-05-05 DIAGNOSIS — R6889 Other general symptoms and signs: Secondary | ICD-10-CM | POA: Diagnosis not present

## 2015-05-05 DIAGNOSIS — R561 Post traumatic seizures: Secondary | ICD-10-CM

## 2015-05-05 LAB — CBC WITH DIFFERENTIAL/PLATELET
BASOS ABS: 0 10*3/uL (ref 0.0–0.1)
Basophils Relative: 0 % (ref 0–1)
EOS ABS: 0 10*3/uL (ref 0.0–0.7)
EOS PCT: 0 % (ref 0–5)
HCT: 42.4 % (ref 36.0–46.0)
Hemoglobin: 14.2 g/dL (ref 12.0–15.0)
Lymphocytes Relative: 26 % (ref 12–46)
Lymphs Abs: 2.8 10*3/uL (ref 0.7–4.0)
MCH: 28.1 pg (ref 26.0–34.0)
MCHC: 33.5 g/dL (ref 30.0–36.0)
MCV: 83.8 fL (ref 78.0–100.0)
MPV: 10.8 fL (ref 8.6–12.4)
Monocytes Absolute: 0.7 10*3/uL (ref 0.1–1.0)
Monocytes Relative: 6 % (ref 3–12)
NEUTROS PCT: 68 % (ref 43–77)
Neutro Abs: 7.4 10*3/uL (ref 1.7–7.7)
PLATELETS: 271 10*3/uL (ref 150–400)
RBC: 5.06 MIL/uL (ref 3.87–5.11)
RDW: 13.3 % (ref 11.5–15.5)
WBC: 10.9 10*3/uL — AB (ref 4.0–10.5)

## 2015-05-05 LAB — COMPREHENSIVE METABOLIC PANEL
ALK PHOS: 58 U/L (ref 33–115)
ALT: 22 U/L (ref 6–29)
AST: 17 U/L (ref 10–30)
Albumin: 4.9 g/dL (ref 3.6–5.1)
BILIRUBIN TOTAL: 0.7 mg/dL (ref 0.2–1.2)
BUN: 6 mg/dL — ABNORMAL LOW (ref 7–25)
CO2: 22 mmol/L (ref 20–31)
CREATININE: 0.6 mg/dL (ref 0.50–1.10)
Calcium: 10 mg/dL (ref 8.6–10.2)
Chloride: 104 mmol/L (ref 98–110)
GLUCOSE: 98 mg/dL (ref 65–99)
Potassium: 4 mmol/L (ref 3.5–5.3)
SODIUM: 136 mmol/L (ref 135–146)
TOTAL PROTEIN: 7.5 g/dL (ref 6.1–8.1)

## 2015-05-05 LAB — TSH: TSH: 1.2 mIU/L

## 2015-05-05 LAB — T4, FREE: Free T4: 1.1 ng/dL (ref 0.8–1.8)

## 2015-05-05 MED ORDER — PREDNISONE 20 MG PO TABS: 40.0000 mg | ORAL_TABLET | Freq: Every day | ORAL | Status: DC

## 2015-05-05 MED ORDER — PREDNISONE 20 MG PO TABS
60.0000 mg | ORAL_TABLET | Freq: Once | ORAL | Status: AC
  Administered 2015-05-05: 60 mg via ORAL
  Filled 2015-05-05: qty 3

## 2015-05-05 NOTE — Progress Notes (Signed)
Subjective:    Patient ID: [name], female    DOB: [birth year], 25 y.o.   MRN: [id number]  05/05/2015  OTHER and Migraine   HPI This 25 y.o. female presents for evaluation of multiple concerns:  1. "Heat tolerance": onset in January; worsening with warmer weather.  Suffering with breathing gets difficult, feels like going to pass out, weight fluctuation, has lost 20 pounds since January, nausea.  Wants thyroid panel; wants pregnancy test.  Gets really sweaty in the heat.  Has been researching on line.  Grandmother with thyroid problems.  Mother with temporary thyroid issues.  Requesting B12 level.  Having sex every day.  Not preventing pregnancy; NOT taking PNV.    Going to beach for honeymoon.  LMP three weeks ago; normal periods every month; stopped OCP.  Doctor kicked out because patient threatened to sue.  Dr. Paralee Cancel HPI from 04/15/2015:  Requesting note stating that needs glasses; guardian is not allowing patient to go to eye doctor.  Getting migraines; did not go to neurologist after ED visit; feels migraines are due to need for glasses.  Seizures have stopped.    Had asthma attack after taking Excedrin last night. Very first dose was last night.  Not taking QVAR; taking Ventolin and Flovent.   Psychiatrist [facility] ACT team.  Started Abilify last night; also prescribed new sleep medication but has not started yet.  Stopped Tegretol three weeks ago due to vomiting; sweating has not improved. Vomiting from Tegretol; has lost 12 pounds in past two months.  HPI Comments: [name] is a 25 y.o. female who presents to the [facility] complaining of a headache. She was seen here 3 days ago after fall 1 week prior. At that time complained of poss seizure from a repeat fall. Did have associated bowel and bladder incontinence but no tongue biting. Was seen 4 days in ED. CT brain on 3/6 was negative. She was referred to neuro for EEG and work up. appointment still  pending for neuro eval. She has hx of schizoaffective disorder, bipolar based on ED note 4 days ago. She was seen in Ed twice that day. Second visit was by transport due to nausea and increased confusion since being discharged that day. She refused IV meds, refused treatment and left AMA refusing to sign AMA papers.   Pt states she's had progressively worsening HA with associated photophobia since fall 6 days ago . She states she was diagnosed with a concussion at ED. She also reports loss of memory after for 3 days after fall which she states has worsened as well as confusion, leg weakness, nausea and worsening vomiting. Pt states vomiting is due to medication change of tegretol, managed by ACt team. She has been taking tylenol 500 mg twice a day and ice pack without relief to HA. Pt denies convulsion or seizure. She denies hx of HA and migraines. Pt also noted that she had an additional fall 4 days ago while in the shower. During this time she hit her head again but is unable to recall object of impaction. Her husband helped her at that time and he denies pt having LOC at that time. She denies illicit drug use.    Dr. Paralee Cancel A/P from 04/15/2015: History of schizoaffective disorder, other past medical history per chart. Reported fall approximately 7-10 days ago, with subsequent fall 3 days ago. Initially reported loss of consciousness with initial fall and retrograde amnesia for possibly 3 days. Was  evaluated through the emergency room with CT scanning that was negative for acute findings, subsequently seen here 3 days ago for convulsions. Convulsions have now ceased, but now with increasing headache, photophobia, increasing nausea and vomiting, and reported right hand and right leg weakness, along with memory difficulty and reported confusion. There were some inconsistencies with strength testing on the right hand, but with repeat testing of the right foot, did not dorsiflex against resistance or  active. No pronator drift, no other focal weakness appreciated.  - At patient's request, discussed with Kayleen Memos (her identified legal guardian through the [facility]) on patient's cell phone.  -With above worsening symptoms, and initial head injury, then repeat head injury, will have further evaluation in the emergency room to decide if further neuroimaging necessary or further testing.     Review of Systems  Constitutional: Negative for fever, chills, diaphoresis and fatigue.  Eyes: Negative for visual disturbance.  Respiratory: Negative for cough and shortness of breath.   Cardiovascular: Negative for chest pain, palpitations and leg swelling.  Gastrointestinal: Negative for nausea, vomiting, abdominal pain, diarrhea and constipation.  Endocrine: Negative for cold intolerance, heat intolerance, polydipsia, polyphagia and polyuria.  Neurological: Negative for dizziness, tremors, seizures, syncope, facial asymmetry, speech difficulty, weakness, light-headedness, numbness and headaches.    Past Medical History  Diagnosis Date  . Asthma   . Depression   . Schizoaffective disorder, bipolar type (HCC)   . Autism   . Essential tremor   . Chronic constipation   . Acid reflux   . Drug-seeking behavior   . Seizures [facility])    Past Surgical History  Procedure Laterality Date  . Mouth surgery     Allergies  Allergen Reactions  . Coconut Flavor Anaphylaxis  . Geodon [Ziprasidone Hcl] Other (See Comments)    Paralysis of mouth  . Haloperidol And Related Other (See Comments)    Paralysis of mouth   . Lithium Other (See Comments)    Seizure like muscle spasms   . Oxycodone Other (See Comments)    hallucinations   . Seroquel [Quetiapine Fumarate] Other (See Comments)    TOO STRONG  . Prilosec [Omeprazole] Nausea And Vomiting  . Percocet [Oxycodone-Acetaminophen]   . Thorazine [Chlorpromazine] Palpitations    Currently taking med-PRN   Current Outpatient  Prescriptions  Medication Sig Dispense Refill  . acetaminophen (TYLENOL) 500 MG tablet Take 1,000 mg by mouth every 6 (six) hours as needed for moderate pain.    Marland Kitchen albuterol (PROVENTIL HFA;VENTOLIN HFA) 108 (90 Base) MCG/ACT inhaler Inhale 1-2 puffs into the lungs every 6 (six) hours as needed for wheezing or shortness of breath. Reported on 05/05/2015    . ARIPiprazole (ABILIFY) 15 MG tablet Take 15 mg by mouth daily.    . chlorproMAZINE (THORAZINE) 25 MG tablet Take 25 mg by mouth 4 (four) times daily as needed (for agitation). Reported on 05/05/2015    . cholecalciferol (VITAMIN D-400) 400 UNITS TABS tablet Take 800 Units by mouth daily.    . cyanocobalamin 2000 MCG tablet Take 2,000 mcg by mouth daily.    Marland Kitchen docusate sodium (COLACE) 100 MG capsule Take 1 capsule (100 mg total) by mouth at bedtime. (Patient taking differently: Take 100 mg by mouth every morning. ) 10 capsule 0  . fluticasone (FLOVENT HFA) 220 MCG/ACT inhaler Inhale into the lungs 2 (two) times daily.    . meloxicam (MOBIC) 15 MG tablet Take 1 tablet (15 mg total) by mouth daily. 30 tablet 2  . Multiple Vitamins-Minerals (  MULTIVITAMIN) tablet Take 1 tablet by mouth daily.    . pantoprazole (PROTONIX) 40 MG tablet Take 1 tablet (40 mg total) by mouth daily.    . prazosin (MINIPRESS) 1 MG capsule Take 1 capsule (1 mg total) by mouth at bedtime. (Patient taking differently: Take 2 mg by mouth at bedtime. )    . predniSONE (DELTASONE) 20 MG tablet Take 2 tablets (40 mg total) by mouth daily. 10 tablet 0  . senna (SENOKOT) 8.6 MG TABS tablet Take 2 tablets by mouth at bedtime.    Marland Kitchen aspirin-acetaminophen-caffeine (PAMPRIN MAX) 250-250-65 MG tablet Take 2 tablets by mouth every 6 (six) hours as needed for headache or migraine. Reported on 05/05/2015     No current facility-administered medications for this visit.   Social History   Social History  . Marital Status: Single    Spouse Name: N/A  . Number of Children: 0  . Years of  Education: N/A   Occupational History  . disability    Social History Main Topics  . Smoking status: Never Smoker   . Smokeless tobacco: Not on file  . Alcohol Use: No  . Drug Use: No  . Sexual Activity: Yes   Other Topics Concern  . Not on file   Social History Narrative   Marital status: married; guardian is Midwife      Children: none      Lives: with husband in apartment      Employment:  Disability      Tobacco: quit smoking; smoked for two years.      Alcohol ;none      Drugs: none           Family History  Problem Relation Age of Onset  . Mental illness Father   . PDD Brother   . Seizures Brother        Objective:    BP 124/62 mmHg  Pulse 105  Temp(Src) 98.1 F (36.7 C) (Oral)  Resp 20  Ht 5\' 5"  (1.651 m)  Wt 168 lb (76.204 kg)  BMI 27.96 kg/m2  SpO2 99%  LMP 04/09/2015 Physical Exam  Constitutional: She is oriented to person, place, and time. She appears well-developed and well-nourished. No distress.  HENT:  Head: Normocephalic and atraumatic.  Right Ear: External ear normal.  Left Ear: External ear normal.  Nose: Nose normal.  Mouth/Throat: Oropharynx is clear and moist.  Eyes: Conjunctivae and EOM are normal. Pupils are equal, round, and reactive to light.  Neck: Normal range of motion. Neck supple. Carotid bruit is not present. No thyromegaly present.  Cardiovascular: Normal rate, regular rhythm, normal heart sounds and intact distal pulses.  Exam reveals no gallop and no friction rub.   No murmur heard. Pulmonary/Chest: Effort normal and breath sounds normal. She has no wheezes. She has no rales.  Abdominal: Soft. Bowel sounds are normal. She exhibits no distension and no mass. There is no tenderness. There is no rebound and no guarding.  Lymphadenopathy:    She has no cervical adenopathy.  Neurological: She is alert and oriented to person, place, and time. No cranial nerve deficit.  Skin: Skin is warm and dry. No rash noted. She is not  diaphoretic. No erythema. No pallor.  Psychiatric: She has a normal mood and affect. Her behavior is normal.        Assessment & Plan:   1. Heat intolerance   2. Asthma, moderate persistent, with status asthmaticus   3. Schizoaffective disorder, bipolar type (HCC)  4. Borderline personality disorder   5. PTSD (post-traumatic stress disorder)   6. Autism spectrum   7. Asperger's syndrome   8. Migraine without aura and without status migrainosus, not intractable   9. Vision changes   10. Vitamin B12 deficiency   11. Abnormal vision     Orders Placed This Encounter  Procedures  . CBC with Differential/Platelet  . Comprehensive metabolic panel  . TSH  . T4, free  . Ambulatory referral to Ophthalmology    Referral Priority:  Routine    Referral Type:  Consultation    Referral Reason:  Specialty Services Required    Requested Specialty:  Ophthalmology    Number of Visits Requested:  1   Meds ordered this encounter  Medications  . fluticasone (FLOVENT HFA) 220 MCG/ACT inhaler    Sig: Inhale into the lungs 2 (two) times daily.  . ARIPiprazole (ABILIFY) 15 MG tablet    Sig: Take 15 mg by mouth daily.    No Follow-up on file.    Peniel Biel Paulita Fujita, M.D. [facility] [address] [place], [place]  [address] [phone] phone [phone] fax

## 2015-05-05 NOTE — Patient Instructions (Addendum)
     IF you received an x-ray today, you will receive an invoice from [facility]. Please contact [facility] at [phone] with questions or concerns regarding your invoice.   IF you received labwork today, you will receive an invoice from [employer]. Please contact [facility] at [phone] with questions or concerns regarding your invoice.   Our billing staff will not be able to assist you with questions regarding bills from these companies.  You will be contacted with the lab results as soon as they are available. The fastest way to get your results is to activate your My Chart account. Instructions are located on the last page of this paperwork. If you have not heard from [name] regarding the results in 2 weeks, please contact this office.     1. Start a prenatal vitamin --- one daily at bedtime.  Please stop your mutivitamin. 2.  Call neurology for an appointment [place].

## 2015-05-05 NOTE — Telephone Encounter (Signed)
Pt needs referral adjusted now referral req. Is for Neurology- w/Dx: Migraines  [phone] Please call when complete

## 2015-05-05 NOTE — Telephone Encounter (Signed)
Patient is requesting a new referral for neurology.  A referral was placed by Kennith Maes for a neurology referral with a diagnosis of convulsions.  The patient declined at the time.  She now wants a neurology referral for migraines.  Please advise.

## 2015-05-05 NOTE — Discharge Instructions (Signed)
1. Medications: prednisone, usual home medications 2. Treatment: rest, drink plenty of fluids 3. Follow Up: please followup with your primary doctorfor discussion of your diagnoses and further evaluation after today's visit; if you do not have a primary care doctor use the phone number listed in your discharge paperwork to find one; please return to the ER for new or worsening symptoms   Asthma, Adult Asthma is a condition of the lungs in which the airways tighten and narrow. Asthma can make it hard to breathe. Asthma cannot be cured, but medicine and lifestyle changes can help control it. Asthma may be started (triggered) by:  Animal skin flakes (dander).  Dust.  Cockroaches.  Pollen.  Mold.  Smoke.  Cleaning products.  Hair sprays or aerosol sprays.  Paint fumes or strong smells.  Cold air, weather changes, and winds.  Crying or laughing hard.  Stress.  Certain medicines or drugs.  Foods, such as dried fruit, potato chips, and sparkling grape juice.  Infections or conditions (colds, flu).  Exercise.  Certain medical conditions or diseases.  Exercise or tiring activities. HOME CARE   Take medicine as told by your doctor.  Use a peak flow meter as told by your doctor. A peak flow meter is a tool that measures how well the lungs are working.  Record and keep track of the peak flow meter's readings.  Understand and use the asthma action plan. An asthma action plan is a written plan for taking care of your asthma and treating your attacks.  To help prevent asthma attacks:  Do not smoke. Stay away from secondhand smoke.  Change your heating and air conditioning filter often.  Limit your use of fireplaces and wood stoves.  Get rid of pests (such as roaches and mice) and their droppings.  Throw away plants if you see mold on them.  Clean your floors. Dust regularly. Use cleaning products that do not smell.  Have someone vacuum when you are not home. Use a  vacuum cleaner with a HEPA filter if possible.  Replace carpet with wood, tile, or vinyl flooring. Carpet can trap animal skin flakes and dust.  Use allergy-proof pillows, mattress covers, and box spring covers.  Wash bed sheets and blankets every week in hot water and dry them in a dryer.  Use blankets that are made of polyester or cotton.  Clean bathrooms and kitchens with bleach. If possible, have someone repaint the walls in these rooms with mold-resistant paint. Keep out of the rooms that are being cleaned and painted.  Wash hands often. GET HELP IF:  You have make a whistling sound when breaking (wheeze), have shortness of breath, or have a cough even if taking medicine to prevent attacks.  The colored mucus you cough up (sputum) is thicker than usual.  The colored mucus you cough up changes from clear or white to yellow, green, gray, or bloody.  You have problems from the medicine you are taking such as:  A rash.  Itching.  Swelling.  Trouble breathing.  You need reliever medicines more than 2-3 times a week.  Your peak flow measurement is still at 50-79% of your personal best after following the action plan for 1 hour.  You have a fever. GET HELP RIGHT AWAY IF:   You seem to be worse and are not responding to medicine during an asthma attack.  You are short of breath even at rest.  You get short of breath when doing very little activity.  You  have trouble eating, drinking, or talking.  You have chest pain.  You have a fast heartbeat.  Your lips or fingernails start to turn blue.  You are light-headed, dizzy, or faint.  Your peak flow is less than 50% of your personal best.   This information is not intended to replace advice given to you by your health care provider. Make sure you discuss any questions you have with your health care provider.   Document Released: 07/11/2007 Document Revised: 10/13/2014 Document Reviewed: 08/21/2012 Elsevier  Interactive Patient Education [employer].

## 2015-05-05 NOTE — Telephone Encounter (Signed)
Pt called in to check on her referral to the neurologist. I explained to patient that a phone response usually takes 24-48 hrs but as soon as the provider submits the new referral we will send it to the neurologist and their office will call her with the appointment date and time.

## 2015-05-05 NOTE — Telephone Encounter (Signed)
Pt called to check status  And provided alt# Spouse [phone]

## 2015-05-05 NOTE — ED Notes (Addendum)
Pt stopped this Probation officer in hallway and asked if she was able to get another breathing treatment. RN notified of request. Pt states "if i'm not getting another treatment then i just paid $22 for my food and i'm going to go eat it".  Pt constantly walking around waiting room nonstop with no SOB noted.

## 2015-05-05 NOTE — ED Provider Notes (Signed)
CSN: [id number]     Arrival date & time 05/04/15  2158 History   First MD Initiated Contact with Patient 05/05/15 0105     Chief Complaint  Patient presents with  . Asthma    HPI   [name] is a 25 y.o. female with a PMH of asthma, depression, schizoaffective disorder, who presents to the ED with asthma exacerbation, which she states started prior to arrival and is now resolved. She notes cough and shortness of breath, which she states is characteristic of her typical asthma symptoms. She denies fever, chills, chest pain. She states she tried her home inhaler with no significant symptom relief. She denies exacerbating factors.   Past Medical History  Diagnosis Date  . Asthma   . Depression   . Schizoaffective disorder, bipolar type ([place])   . Autism   . Essential tremor   . Chronic constipation   . Acid reflux   . Drug-seeking behavior   . Seizures [facility])    Past Surgical History  Procedure Laterality Date  . Mouth surgery     Family History  Problem Relation Age of Onset  . Mental illness Father   . PDD Brother   . Seizures Brother    Social History  Substance Use Topics  . Smoking status: Never Smoker   . Smokeless tobacco: None  . Alcohol Use: No   OB History    No data available      Review of Systems  Constitutional: Negative for fever and chills.  Respiratory: Positive for cough and shortness of breath.   Cardiovascular: Negative for chest pain.  All other systems reviewed and are negative.     Allergies  Coconut flavor; Geodon; Haloperidol and related; Lithium; Oxycodone; Seroquel; Prilosec; Percocet; and Thorazine  Home Medications   Prior to Admission medications   Medication Sig Start Date End Date Taking? Authorizing Provider  acetaminophen (TYLENOL) 500 MG tablet Take 1,000 mg by mouth every 6 (six) hours as needed for moderate pain.    Historical Provider, MD  albuterol (PROVENTIL HFA;VENTOLIN HFA) 108 (90 Base) MCG/ACT inhaler Inhale 1-2  puffs into the lungs every 6 (six) hours as needed for wheezing or shortness of breath.    Historical Provider, MD  aspirin-acetaminophen-caffeine (PAMPRIN MAX) [phone] MG tablet Take 2 tablets by mouth every 6 (six) hours as needed for headache or migraine. Reported on 04/12/2015    Historical Provider, MD  beclomethasone (QVAR) 40 MCG/ACT inhaler Inhale 1 puff into the lungs 2 (two) times daily.    Historical Provider, MD  carbamazepine (TEGRETOL XR) 200 MG 12 hr tablet Take 200-400 mg by mouth 2 (two) times daily. 200mg  in the morning and 400mg  in the evening    Historical Provider, MD  chlorproMAZINE (THORAZINE) 25 MG tablet Take 25 mg by mouth 4 (four) times daily as needed (for agitation).    Historical Provider, MD  cholecalciferol (VITAMIN D-400) 400 UNITS TABS tablet Take 800 Units by mouth daily.    Historical Provider, MD  cyanocobalamin 2000 MCG tablet Take 2,000 mcg by mouth daily.    Historical Provider, MD  docusate sodium (COLACE) 100 MG capsule Take 1 capsule (100 mg total) by mouth at bedtime. Patient taking differently: Take 100 mg by mouth every morning.  03/29/14   Niel Hummer, NP  meloxicam (MOBIC) 15 MG tablet Take 1 tablet (15 mg total) by mouth daily. 04/12/15   Nolon Rod, DO  Multiple Vitamins-Minerals (MULTIVITAMIN) tablet Take 1 tablet by mouth  daily.    Historical Provider, MD  pantoprazole (PROTONIX) 40 MG tablet Take 1 tablet (40 mg total) by mouth daily. 03/29/14   Niel Hummer, NP  prazosin (MINIPRESS) 1 MG capsule Take 1 capsule (1 mg total) by mouth at bedtime. Patient taking differently: Take 2 mg by mouth at bedtime.  03/29/14   Niel Hummer, NP  predniSONE (DELTASONE) 20 MG tablet Take 2 tablets (40 mg total) by mouth daily. 05/05/15   Marella Chimes, PA-C  senna (SENOKOT) 8.6 MG TABS tablet Take 2 tablets by mouth at bedtime.    Historical Provider, MD  sertraline (ZOLOFT) 100 MG tablet Take 1 tablet (100 mg total) by mouth daily. Patient taking  differently: Take 50 mg by mouth daily.  01/20/15   Delfin Gant, NP  tiZANidine (ZANAFLEX) 4 MG tablet Take 4 mg by mouth every 6 (six) hours as needed for muscle spasms.    Historical Provider, MD  traZODone (DESYREL) 100 MG tablet Take 1 tablet (100 mg total) by mouth at bedtime. Patient taking differently: Take 100 mg by mouth at bedtime as needed for sleep.  01/20/15   Delfin Gant, NP    BP 110/82 mmHg  Pulse 105  Temp(Src) 98.1 F (36.7 C) (Oral)  Resp 20  SpO2 99%  LMP 04/09/2015 Physical Exam  Constitutional: She is oriented to person, place, and time. She appears well-developed and well-nourished. No distress.  HENT:  Head: Normocephalic and atraumatic.  Right Ear: External ear normal.  Left Ear: External ear normal.  Nose: Nose normal.  Mouth/Throat: Uvula is midline, oropharynx is clear and moist and mucous membranes are normal.  Eyes: Conjunctivae, EOM and lids are normal. Pupils are equal, round, and reactive to light. Right eye exhibits no discharge. Left eye exhibits no discharge. No scleral icterus.  Neck: Normal range of motion. Neck supple.  Cardiovascular: Normal rate, regular rhythm, normal heart sounds, intact distal pulses and normal pulses.   Pulmonary/Chest: Effort normal and breath sounds normal. No respiratory distress. She has no wheezes. She has no rales.  Abdominal: Soft. Normal appearance and bowel sounds are normal. She exhibits no distension and no mass. There is no tenderness. There is no rigidity, no rebound and no guarding.  Musculoskeletal: Normal range of motion. She exhibits no edema or tenderness.  Neurological: She is alert and oriented to person, place, and time.  Skin: Skin is warm, dry and intact. No rash noted. She is not diaphoretic. No erythema. No pallor.  Psychiatric: She has a normal mood and affect. Her speech is normal and behavior is normal.  Nursing note and vitals reviewed.   ED Course  Procedures (including critical  care time)  Labs Review Labs Reviewed - No data to display  Imaging Review No results found.    EKG Interpretation None      MDM   Final diagnoses:  Asthma exacerbation    25 year old female presents with asthma exacerbation. Reports cough and shortness of breath, which she states is characteristic of her typical asthma symptoms. Received a breathing treatment while in the waiting room, and states her symptoms are significantly improved.   Patient is afebrile. Heart rate 105. Heart regular rhythm. Lungs clear to auscultation bilaterally. No wheezing or increased work of breathing.   Do not feel imaging is indicated at this time. Patient noted to be walking around and requesting several different things from multiple different staff throughout her ED course. Patient is nontoxic and well-appearing, feel she is  stable for discharge at this time. Will give short steroid course. Patient to follow up with PCP, information for [facility] given. Strict return precautions discussed. Patient verbalizes her understanding and is in agreement with plan.  Of note, patient requesting thyroid function testing due to heat sensitivity, which she states she looked up online. Advised she can follow up with her PCP regarding this.    BP 110/82 mmHg  Pulse 105  Temp(Src) 98.1 F (36.7 C) (Oral)  Resp 20  SpO2 99%  LMP 04/09/2015     Marella Chimes, PA-C 05/05/15 0138  Quintella Reichert, MD 05/06/15 [phone]

## 2015-05-06 ENCOUNTER — Emergency Department (HOSPITAL_COMMUNITY)
Admission: EM | Admit: 2015-05-06 | Discharge: 2015-05-07 | Disposition: A | Discharge status: Home / Self Care | Payer: Medicare Other | Attending: Emergency Medicine | Admitting: Emergency Medicine

## 2015-05-06 ENCOUNTER — Encounter (HOSPITAL_COMMUNITY): Payer: Self-pay | Admitting: Family Medicine

## 2015-05-06 ENCOUNTER — Emergency Department (HOSPITAL_COMMUNITY): Payer: Medicare Other

## 2015-05-06 DIAGNOSIS — F329 Major depressive disorder, single episode, unspecified: Secondary | ICD-10-CM | POA: Insufficient documentation

## 2015-05-06 DIAGNOSIS — Z7951 Long term (current) use of inhaled steroids: Secondary | ICD-10-CM | POA: Diagnosis not present

## 2015-05-06 DIAGNOSIS — R102 Pelvic and perineal pain: Secondary | ICD-10-CM | POA: Diagnosis not present

## 2015-05-06 DIAGNOSIS — N72 Inflammatory disease of cervix uteri: Secondary | ICD-10-CM

## 2015-05-06 DIAGNOSIS — Z3200 Encounter for pregnancy test, result unknown: Secondary | ICD-10-CM | POA: Diagnosis not present

## 2015-05-06 DIAGNOSIS — Z79899 Other long term (current) drug therapy: Secondary | ICD-10-CM | POA: Insufficient documentation

## 2015-05-06 DIAGNOSIS — J45909 Unspecified asthma, uncomplicated: Secondary | ICD-10-CM | POA: Diagnosis not present

## 2015-05-06 DIAGNOSIS — G8929 Other chronic pain: Secondary | ICD-10-CM | POA: Diagnosis not present

## 2015-05-06 DIAGNOSIS — N889 Noninflammatory disorder of cervix uteri, unspecified: Secondary | ICD-10-CM | POA: Diagnosis not present

## 2015-05-06 DIAGNOSIS — F259 Schizoaffective disorder, unspecified: Secondary | ICD-10-CM | POA: Diagnosis not present

## 2015-05-06 DIAGNOSIS — R51 Headache: Secondary | ICD-10-CM | POA: Diagnosis not present

## 2015-05-06 DIAGNOSIS — F840 Autistic disorder: Secondary | ICD-10-CM | POA: Insufficient documentation

## 2015-05-06 DIAGNOSIS — K5900 Constipation, unspecified: Secondary | ICD-10-CM | POA: Insufficient documentation

## 2015-05-06 DIAGNOSIS — R300 Dysuria: Secondary | ICD-10-CM | POA: Diagnosis not present

## 2015-05-06 DIAGNOSIS — Z791 Long term (current) use of non-steroidal anti-inflammatories (NSAID): Secondary | ICD-10-CM | POA: Insufficient documentation

## 2015-05-06 LAB — URINALYSIS, ROUTINE W REFLEX MICROSCOPIC
Bilirubin Urine: NEGATIVE
GLUCOSE, UA: NEGATIVE mg/dL
Hgb urine dipstick: NEGATIVE
Ketones, ur: NEGATIVE mg/dL
LEUKOCYTES UA: NEGATIVE
Nitrite: NEGATIVE
PH: 7 (ref 5.0–8.0)
Protein, ur: NEGATIVE mg/dL
SPECIFIC GRAVITY, URINE: 1.022 (ref 1.005–1.030)

## 2015-05-06 LAB — COMPREHENSIVE METABOLIC PANEL
ALK PHOS: 54 U/L (ref 38–126)
ALT: 21 U/L (ref 14–54)
AST: 25 U/L (ref 15–41)
Albumin: 4.2 g/dL (ref 3.5–5.0)
Anion gap: 9 (ref 5–15)
BUN: 9 mg/dL (ref 6–20)
CALCIUM: 9.3 mg/dL (ref 8.9–10.3)
CO2: 21 mmol/L — AB (ref 22–32)
CREATININE: 0.7 mg/dL (ref 0.44–1.00)
Chloride: 108 mmol/L (ref 101–111)
GFR calc non Af Amer: >60 mL/min (ref >60)
Glucose, Bld: 92 mg/dL (ref 65–99)
Potassium: 3.8 mmol/L (ref 3.5–5.1)
SODIUM: 138 mmol/L (ref 135–145)
Total Bilirubin: 0.4 mg/dL (ref 0.3–1.2)
Total Protein: 6.9 g/dL (ref 6.5–8.1)

## 2015-05-06 LAB — CBC WITH DIFFERENTIAL/PLATELET
Basophils Absolute: 0 10*3/uL (ref 0.0–0.1)
Basophils Relative: 0 %
EOS ABS: 0 10*3/uL (ref 0.0–0.7)
Eosinophils Relative: 0 %
HEMATOCRIT: 40 % (ref 36.0–46.0)
HEMOGLOBIN: 13.5 g/dL (ref 12.0–15.0)
LYMPHS ABS: 3.7 10*3/uL (ref 0.7–4.0)
LYMPHS PCT: 35 %
MCH: 28.6 pg (ref 26.0–34.0)
MCHC: 33.8 g/dL (ref 30.0–36.0)
MCV: 84.7 fL (ref 78.0–100.0)
Monocytes Absolute: 0.7 10*3/uL (ref 0.1–1.0)
Monocytes Relative: 7 %
NEUTROS ABS: 6 10*3/uL (ref 1.7–7.7)
NEUTROS PCT: 58 %
Platelets: 249 10*3/uL (ref 150–400)
RBC: 4.72 MIL/uL (ref 3.87–5.11)
RDW: 13.3 % (ref 11.5–15.5)
WBC: 10.5 10*3/uL (ref 4.0–10.5)

## 2015-05-06 LAB — WET PREP, GENITAL
Clue Cells Wet Prep HPF POC: NONE SEEN
Sperm: NONE SEEN
TRICH WET PREP: NONE SEEN
Yeast Wet Prep HPF POC: NONE SEEN

## 2015-05-06 LAB — I-STAT BETA HCG BLOOD, ED (MC, WL, AP ONLY): I-stat hCG, quantitative: 11.4 m[IU]/mL — ABNORMAL HIGH (ref <5)

## 2015-05-06 MED ORDER — STERILE WATER FOR INJECTION IJ SOLN
INTRAMUSCULAR | Status: AC
  Administered 2015-05-06: 10 mL
  Filled 2015-05-06: qty 10

## 2015-05-06 MED ORDER — SODIUM CHLORIDE 0.9 % IV BOLUS (SEPSIS): 1000.0000 mL | Freq: Once | INTRAVENOUS | Status: DC

## 2015-05-06 MED ORDER — IBUPROFEN 800 MG PO TABS: 800.0000 mg | ORAL_TABLET | Freq: Three times a day (TID) | ORAL | Status: DC

## 2015-05-06 MED ORDER — DEXAMETHASONE SODIUM PHOSPHATE 10 MG/ML IJ SOLN
6.0000 mg | Freq: Once | INTRAMUSCULAR | Status: DC
  Filled 2015-05-06 (×2): qty 1

## 2015-05-06 MED ORDER — METOCLOPRAMIDE HCL 5 MG/ML IJ SOLN
10.0000 mg | Freq: Once | INTRAMUSCULAR | Status: DC
  Filled 2015-05-06 (×2): qty 2

## 2015-05-06 MED ORDER — AZITHROMYCIN 250 MG PO TABS
1000.0000 mg | ORAL_TABLET | Freq: Once | ORAL | Status: AC
  Administered 2015-05-06: 1000 mg via ORAL
  Filled 2015-05-06: qty 4

## 2015-05-06 MED ORDER — DIPHENHYDRAMINE HCL 50 MG/ML IJ SOLN
25.0000 mg | Freq: Once | INTRAMUSCULAR | Status: DC
  Filled 2015-05-06 (×2): qty 1

## 2015-05-06 MED ORDER — HYDROCODONE-ACETAMINOPHEN 5-325 MG PO TABS: 1.0000 | ORAL_TABLET | Freq: Four times a day (QID) | ORAL | Status: DC | PRN

## 2015-05-06 MED ORDER — DOXYLAMINE-PYRIDOXINE 10-10 MG PO TBEC: 2.0000 | DELAYED_RELEASE_TABLET | Freq: Every day | ORAL | Status: DC

## 2015-05-06 MED ORDER — KETOROLAC TROMETHAMINE 30 MG/ML IJ SOLN
30.0000 mg | Freq: Once | INTRAMUSCULAR | Status: DC
  Filled 2015-05-06 (×2): qty 1

## 2015-05-06 MED ORDER — CEFTRIAXONE SODIUM 250 MG IJ SOLR
250.0000 mg | Freq: Once | INTRAMUSCULAR | Status: AC
  Administered 2015-05-06: 250 mg via INTRAMUSCULAR
  Filled 2015-05-06: qty 250

## 2015-05-06 NOTE — Discharge Instructions (Signed)
Human Chorionic Gonadotropin Test   You have a low value, but positive HCG test - 11.4 Recommend follow up with OB/GYN for this and other complaints, including pelvic pain, possible cyst, possible STD infection, cervicitis and dysuria Please get referral for OB/GYN care or go to the Health department Human chorionic gonadotropin (hCG) is a hormone produced during pregnancy by the cells that form the placenta. The placenta is the organ that grows inside your womb (uterus) to nourish a developing baby. When you are pregnant, hCG starts to appear in your blood about 11 days after conception. It continues to go up for the first 8-11 weeks of pregnancy.  Your hCG level can be measured with several different types of tests. You may have:  A urine test.  hCG is eliminated from your body by your kidneys, so a urine test is one way to check for this hormone.  A urine test only shows whether there is hCG in your urine. It does not measure how much.  You may have a urine test to find out whether you are pregnant.  A home pregnancy test detects whether there is hCG in your urine.  A qualitative blood test.  Like the urine test, this blood test only shows whether there is hCG in your blood. It does not measure how much.  You may have this type of blood test to find out whether you are pregnant.  A quantitative blood test.  This type of blood test measures the amount of hCG in your blood.  You may have this type of test to diagnose an abnormal pregnancy or determine whether you are at risk of, or have had, a failed pregnancy (miscarriage). PREPARATION FOR TEST For the urine test:  Limit your fluid intake before the urine test as directed by your health care provider.  Collect the sample the first time you urinate in the morning.  Let your health care provider know if you have blood in your urine. This may interfere with the test result. Some medicines may interfere with the urine and blood  tests. Let your health care provider know about all the medicines you are taking. No additional preparation is required for the blood test.  RESULTS It is your responsibility to obtain your test results. Ask the lab or department performing the test when and how you will get your results. Talk to your health care provider if you have any questions about your test results. The results of the hCG urine test and the qualitative hCG blood test are either positive or negative. The results of the quantitative hCG blood test are reported as a number. hCG is measured in international units per liter (IU/L). Meaning of Negative Test Results A negative result on a urine or qualitative blood test could mean that you are not pregnant. It could also mean the test was done too early to detect hCG. If you still have other signs of pregnancy, the test should be repeated. Meaning of Positive Test Results A positive result on the urine or qualitative blood tests means you are most likely pregnant. Your health care provider may confirm your pregnancy with an imaging study of the inside of your uterus at 5-6 weeks (ultrasound).  Range of Normal Values Ranges for normal values for the quantitative hCG blood test may vary among different labs and hospitals. You should always check with your health care provider after having lab work or other tests done to discuss whether your values are considered within normal  limits.   Less than 5 IU/L means it is most likely you are not pregnant.  Greater than 25 IU/L means it is most likely you are pregnant. Meaning of Results Outside Normal Value Ranges If your hCG level on the quantitative test is not what would be expected, you may have the test again. It may also be important for your health care provider to know whether your hCG level goes up or down over time. Common causes of results outside the normal range include:   Being pregnant with twins (hCG level is higher than  expected).  Having an ectopic pregnancy (hCG rises more slowly than expected).  Miscarriage (hCG level falls).  Abnormal growths in the womb (hCG level is higher than expected).   This information is not intended to replace advice given to you by your health care provider. Make sure you discuss any questions you have with your health care provider.   Document Released: 02/24/2004 Document Revised: 02/12/2014 Document Reviewed: 04/28/2013 Elsevier Interactive Patient Education 2016 Else[employer] Inc[employer]    Dysuria Dysuria is pain or discomfort while urinating. The pain or discomfort may be felt in the tube that carries urine out of the bladder (urethra) or in the surrounding tissue of the genitals. The pain may also be felt in the groin area, lower abdomen, and lower back. You may have to urinate frequently or have the sudden feeling that you have to urinate (urgency). Dysuria can affect both men and women, but is more common in women. Dysuria can be caused by many different things, including:  Urinary tract infection in women.  Infection of the kidney or bladder.  Kidney stones or bladder stones.  Certain sexually transmitted infections (STIs), such as chlamydia.  Dehydration.  Inflammation of the vagina.  Use of certain medicines.  Use of certain soaps or scented products that cause irritation. HOME CARE INSTRUCTIONS Watch your dysuria for any changes. The following actions may help to reduce any discomfort you are feeling:  Drink enough fluid to keep your urine clear or pale yellow.  Empty your bladder often. Avoid holding urine for long periods of time.  After a bowel movement or urination, women should cleanse from front to back, using each tissue only once.  Empty your bladder after sexual intercourse.  Take medicines only as directed by your health care provider.  If you were prescribed an antibiotic medicine, finish it all even if you start to feel better.  Avoid  caffeine, tea, and alcohol. They can irritate the bladder and make dysuria worse. In men, alcohol may irritate the prostate.  Keep all follow-up visits as directed by your health care provider. This is important.  If you had any tests done to find the cause of dysuria, it is your responsibility to obtain your test results. Ask the lab or department performing the test when and how you will get your results. Talk with your health care provider if you have any questions about your results. SEEK MEDICAL CARE IF:  You develop pain in your back or sides.  You have a fever.  You have nausea or vomiting.  You have blood in your urine.  You are not urinating as often as you usually do. SEEK IMMEDIATE MEDICAL CARE IF:  You pain is severe and not relieved with medicines.  You are unable to hold down any fluids.  You or someone else notices a change in your mental function.  You have a rapid heartbeat at rest.  You have shaking  or chills.  You feel extremely weak.   This information is not intended to replace advice given to you by your health care provider. Make sure you discuss any questions you have with your health care provider.   Document Released: 10/21/2003 Document Revised: 02/12/2014 Document Reviewed: 09/17/2013 Elsevier Interactive Patient Education 2016 Elsevier Inc.  Cervicitis Cervicitis is a soreness and swelling (inflammation) of the cervix. Your cervix is located at the bottom of your uterus. It opens up to the vagina. CAUSES   Sexually transmitted infections (STIs).   Allergic reaction.   Medicines or birth control devices that are put in the vagina.   Injury to the cervix.   Bacterial infections.  RISK FACTORS You are at greater risk if you:  Have unprotected sexual intercourse.  Have sexual intercourse with many partners.  Began sexual intercourse at an early age.  Have a history of STIs. SYMPTOMS  There may be no symptoms. If symptoms occur,  they may include:   Gray, white, yellow, or bad-smelling vaginal discharge.   Pain or itching of the area outside the vagina.   Painful sexual intercourse.   Lower abdominal or lower back pain, especially during intercourse.   Frequent urination.   Abnormal vaginal bleeding between periods, after sexual intercourse, or after menopause.   Pressure or a heavy feeling in the pelvis.  DIAGNOSIS  Diagnosis is made after a pelvic exam. Other tests may include:   Examination of any discharge under a microscope (wet prep).   A Pap test.  TREATMENT  Treatment will depend on the cause of cervicitis. If it is caused by an STI, both you and your partner will need to be treated. Antibiotic medicines will be given.  HOME CARE INSTRUCTIONS   Do not have sexual intercourse until your health care provider says it is okay.   Do not have sexual intercourse until your partner has been treated, if your cervicitis is caused by an STI.   Take your antibiotics as directed. Finish them even if you start to feel better.  SEEK MEDICAL CARE IF:  Your symptoms come back.   You have a fever.  MAKE SURE YOU:   Understand these instructions.  Will watch your condition.  Will get help right away if you are not doing well or get worse.   This information is not intended to replace advice given to you by your health care provider. Make sure you discuss any questions you have with your health care provider.   Document Released: 01/22/2005 Document Revised: 01/27/2013 Document Reviewed: 07/16/2012 Elsevier Interactive Patient Education 2016 Elsevier Inc.  Pelvic Pain, Female Female pelvic pain can be caused by many different things and start from a variety of places. Pelvic pain refers to pain that is located in the lower half of the abdomen and between your hips. The pain may occur over a short period of time (acute) or may be reoccurring (chronic). The cause of pelvic pain may be related  to disorders affecting the female reproductive organs (gynecologic), but it may also be related to the bladder, kidney stones, an intestinal complication, or muscle or skeletal problems. Getting help right away for pelvic pain is important, especially if there has been severe, sharp, or a sudden onset of unusual pain. It is also important to get help right away because some types of pelvic pain can be life threatening.  CAUSES  Below are only some of the causes of pelvic pain. The causes of pelvic pain can be in  one of several categories.   Gynecologic.  Pelvic inflammatory disease.  Sexually transmitted infection.  Ovarian cyst or a twisted ovarian ligament (ovarian torsion).  Uterine lining that grows outside the uterus (endometriosis).  Fibroids, cysts, or tumors.  Ovulation.  Pregnancy.  Pregnancy that occurs outside the uterus (ectopic pregnancy).  Miscarriage.  Labor.  Abruption of the placenta or ruptured uterus.  Infection.  Uterine infection (endometritis).  Bladder infection.  Diverticulitis.  Miscarriage related to a uterine infection (septic abortion).  Bladder.  Inflammation of the bladder (cystitis).  Kidney stone(s).  Gastrointestinal.  Constipation.  Diverticulitis.  Neurologic.  Trauma.  Feeling pelvic pain because of mental or emotional causes (psychosomatic).  Cancers of the bowel or pelvis. EVALUATION  Your caregiver will want to take a careful history of your concerns. This includes recent changes in your health, a careful gynecologic history of your periods (menses), and a sexual history. Obtaining your family history and medical history is also important. Your caregiver may suggest a pelvic exam. A pelvic exam will help identify the location and severity of the pain. It also helps in the evaluation of which organ system may be involved. In order to identify the cause of the pelvic pain and be properly treated, your caregiver may order  tests. These tests may include:   A pregnancy test.  Pelvic ultrasonography.  An X-ray exam of the abdomen.  A urinalysis or evaluation of vaginal discharge.  Blood tests. HOME CARE INSTRUCTIONS   Only take over-the-counter or prescription medicines for pain, discomfort, or fever as directed by your caregiver.   Rest as directed by your caregiver.   Eat a balanced diet.   Drink enough fluids to make your urine clear or pale yellow, or as directed.   Avoid sexual intercourse if it causes pain.   Apply warm or cold compresses to the lower abdomen depending on which one helps the pain.   Avoid stressful situations.   Keep a journal of your pelvic pain. Write down when it started, where the pain is located, and if there are things that seem to be associated with the pain, such as food or your menstrual cycle.  Follow up with your caregiver as directed.  SEEK MEDICAL CARE IF:  Your medicine does not help your pain.  You have abnormal vaginal discharge. SEEK IMMEDIATE MEDICAL CARE IF:   You have heavy bleeding from the vagina.   Your pelvic pain increases.   You feel light-headed or faint.   You have chills.   You have pain with urination or blood in your urine.   You have uncontrolled diarrhea or vomiting.   You have a fever or persistent symptoms for more than 3 days.  You have a fever and your symptoms suddenly get worse.   You are being physically or sexually abused.   This information is not intended to replace advice given to you by your health care provider. Make sure you discuss any questions you have with your health care provider.   Document Released: 12/20/2003 Document Revised: 10/13/2014 Document Reviewed: 05/14/2011 Elsevier Interactive Patient Education 2016 Elsevier Inc. Post-Concussion Syndrome Post-concussion syndrome describes the symptoms that can occur after a head injury. These symptoms can last from weeks to months. CAUSES    It is not clear why some head injuries cause post-concussion syndrome. It can occur whether your head injury was mild or severe and whether you were wearing head protection or not.  SIGNS AND SYMPTOMS  Memory difficulties.  Dizziness.  Headaches.  Double vision or blurry vision.  Sensitivity to light.  Hearing difficulties.  Depression.  Tiredness.  Weakness.  Difficulty with concentration.  Difficulty sleeping or staying asleep.  Vomiting.  Poor balance or instability on your feet.  Slow reaction time.  Difficulty learning and remembering things you have heard. DIAGNOSIS  There is no test to determine whether you have post-concussion syndrome. Your health care provider may order an imaging scan of your brain, such as a CT scan, to check for other problems that may be causing your symptoms (such as a severe injury inside your skull). TREATMENT  Usually, these problems disappear over time without medical care. Your health care provider may prescribe medicine to help ease your symptoms. It is important to follow up with a neurologist to evaluate your recovery and address any lingering symptoms or issues. HOME CARE INSTRUCTIONS   Take medicines only as directed by your health care provider. Do not take aspirin. Aspirin can slow blood clotting.  Sleep with your head slightly elevated to help with headaches.  Avoid any situation where there is potential for another head injury. This includes football, hockey, soccer, basketball, martial arts, downhill snow sports, and horseback riding. Your condition will get worse every time you experience a concussion. You should avoid these activities until you are evaluated by the appropriate follow-up health care providers.  Keep all follow-up visits as directed by your health care provider. This is important. SEEK MEDICAL CARE IF:  You have increased problems paying attention or concentrating.  You have increased difficulty  remembering or learning new information.  You need more time to complete tasks or assignments than before.  You have increased irritability or decreased ability to cope with stress.  You have more symptoms than before. Seek medical care if you have any of the following symptoms for more than two weeks after your injury:  Lasting (chronic) headaches.  Dizziness or balance problems.  Nausea.  Vision problems.  Increased sensitivity to noise or light.  Depression or mood swings.  Anxiety or irritability.  Memory problems.  Difficulty concentrating or paying attention.  Sleep problems.  Feeling tired all the time. SEEK IMMEDIATE MEDICAL CARE IF:  You have confusion or unusual drowsiness.  Others find it difficult to wake you up.  You have nausea or persistent, forceful vomiting.  You feel like you are moving when you are not (vertigo). Your eyes may move rapidly back and forth.  You have convulsions or faint.  You have severe, persistent headaches that are not relieved by medicine.  You cannot use your arms or legs normally.  One of your pupils is larger than the other.  You have clear or bloody discharge from your nose or ears.  Your problems are getting worse, not better. MAKE SURE YOU:  Understand these instructions.  Will watch your condition.  Will get help right away if you are not doing well or get worse.   This information is not intended to replace advice given to you by your health care provider. Make sure you discuss any questions you have with your health care provider.   Document Released: 07/14/2001 Document Revised: 02/12/2014 Document Reviewed: 04/29/2013 Elsevier Interactive Patient Education [employer].

## 2015-05-06 NOTE — ED Notes (Signed)
Pt here for headache and lower abd pain. Pt having rapid speech and very fidgety  and refusing blood draw and urine sample. sts she took a preg test and was negative,.

## 2015-05-06 NOTE — Telephone Encounter (Signed)
[facility]

## 2015-05-06 NOTE — ED Notes (Signed)
Pt up to restroom.  Gait steady and even.   

## 2015-05-06 NOTE — Telephone Encounter (Signed)
[place] for neurology?

## 2015-05-07 LAB — RPR: RPR Ser Ql: NONREACTIVE

## 2015-05-07 LAB — HIV ANTIBODY (ROUTINE TESTING W REFLEX): HIV SCREEN 4TH GENERATION: NONREACTIVE

## 2015-05-07 NOTE — ED Notes (Signed)
Patient able to dress and ambulate independently 

## 2015-05-08 NOTE — Telephone Encounter (Signed)
Referral to neurology placed.

## 2015-05-09 LAB — GC/CHLAMYDIA PROBE AMP (~~LOC~~) NOT AT ARMC
Chlamydia: NEGATIVE
Neisseria Gonorrhea: NEGATIVE

## 2015-05-10 ENCOUNTER — Inpatient Hospital Stay (HOSPITAL_COMMUNITY): Payer: Medicare Other

## 2015-05-10 ENCOUNTER — Telehealth: Payer: Self-pay

## 2015-05-10 ENCOUNTER — Encounter (HOSPITAL_COMMUNITY): Payer: Self-pay | Admitting: *Deleted

## 2015-05-10 ENCOUNTER — Inpatient Hospital Stay (HOSPITAL_COMMUNITY)
Admission: AD | Admit: 2015-05-10 | Discharge: 2015-05-10 | Disposition: A | Discharge status: Home / Self Care | Payer: Medicare Other | Source: Ambulatory Visit | Attending: Obstetrics & Gynecology | Admitting: Obstetrics & Gynecology

## 2015-05-10 ENCOUNTER — Telehealth: Payer: Self-pay | Admitting: *Deleted

## 2015-05-10 DIAGNOSIS — Z79899 Other long term (current) drug therapy: Secondary | ICD-10-CM | POA: Insufficient documentation

## 2015-05-10 DIAGNOSIS — K5909 Other constipation: Secondary | ICD-10-CM | POA: Insufficient documentation

## 2015-05-10 DIAGNOSIS — Z888 Allergy status to other drugs, medicaments and biological substances status: Secondary | ICD-10-CM | POA: Diagnosis not present

## 2015-05-10 DIAGNOSIS — N939 Abnormal uterine and vaginal bleeding, unspecified: Secondary | ICD-10-CM

## 2015-05-10 DIAGNOSIS — Z7951 Long term (current) use of inhaled steroids: Secondary | ICD-10-CM | POA: Diagnosis not present

## 2015-05-10 DIAGNOSIS — O26891 Other specified pregnancy related conditions, first trimester: Secondary | ICD-10-CM | POA: Diagnosis not present

## 2015-05-10 DIAGNOSIS — Z91011 Allergy to milk products: Secondary | ICD-10-CM | POA: Insufficient documentation

## 2015-05-10 DIAGNOSIS — K219 Gastro-esophageal reflux disease without esophagitis: Secondary | ICD-10-CM | POA: Insufficient documentation

## 2015-05-10 DIAGNOSIS — R1032 Left lower quadrant pain: Secondary | ICD-10-CM | POA: Diagnosis present

## 2015-05-10 DIAGNOSIS — G250 Essential tremor: Secondary | ICD-10-CM | POA: Diagnosis not present

## 2015-05-10 DIAGNOSIS — J45909 Unspecified asthma, uncomplicated: Secondary | ICD-10-CM | POA: Insufficient documentation

## 2015-05-10 DIAGNOSIS — R102 Pelvic and perineal pain: Secondary | ICD-10-CM | POA: Diagnosis not present

## 2015-05-10 DIAGNOSIS — F840 Autistic disorder: Secondary | ICD-10-CM | POA: Diagnosis not present

## 2015-05-10 DIAGNOSIS — Z91018 Allergy to other foods: Secondary | ICD-10-CM | POA: Insufficient documentation

## 2015-05-10 DIAGNOSIS — Z91013 Allergy to seafood: Secondary | ICD-10-CM | POA: Insufficient documentation

## 2015-05-10 DIAGNOSIS — Z885 Allergy status to narcotic agent status: Secondary | ICD-10-CM | POA: Diagnosis not present

## 2015-05-10 DIAGNOSIS — F250 Schizoaffective disorder, bipolar type: Secondary | ICD-10-CM | POA: Diagnosis not present

## 2015-05-10 DIAGNOSIS — Z91030 Bee allergy status: Secondary | ICD-10-CM | POA: Insufficient documentation

## 2015-05-10 DIAGNOSIS — Z3A00 Weeks of gestation of pregnancy not specified: Secondary | ICD-10-CM | POA: Diagnosis not present

## 2015-05-10 DIAGNOSIS — O4691 Antepartum hemorrhage, unspecified, first trimester: Secondary | ICD-10-CM | POA: Diagnosis not present

## 2015-05-10 LAB — CBC
HCT: 36.4 % (ref 36.0–46.0)
Hemoglobin: 12.2 g/dL (ref 12.0–15.0)
MCH: 28.3 pg (ref 26.0–34.0)
MCHC: 33.5 g/dL (ref 30.0–36.0)
MCV: 84.5 fL (ref 78.0–100.0)
PLATELETS: 231 10*3/uL (ref 150–400)
RBC: 4.31 MIL/uL (ref 3.87–5.11)
RDW: 13.7 % (ref 11.5–15.5)
WBC: 11.3 10*3/uL — AB (ref 4.0–10.5)

## 2015-05-10 LAB — URINE MICROSCOPIC-ADD ON

## 2015-05-10 LAB — URINALYSIS, ROUTINE W REFLEX MICROSCOPIC
Bilirubin Urine: NEGATIVE
GLUCOSE, UA: NEGATIVE mg/dL
Ketones, ur: NEGATIVE mg/dL
LEUKOCYTES UA: NEGATIVE
Nitrite: NEGATIVE
PH: 5 (ref 5.0–8.0)
Protein, ur: NEGATIVE mg/dL

## 2015-05-10 LAB — ABO/RH: ABO/RH(D): A NEG

## 2015-05-10 LAB — HCG, QUANTITATIVE, PREGNANCY: HCG, BETA CHAIN, QUANT, S: 3 m[IU]/mL (ref <5)

## 2015-05-10 LAB — POCT PREGNANCY, URINE: Preg Test, Ur: NEGATIVE

## 2015-05-10 NOTE — Telephone Encounter (Signed)
Dr. Richardson [place] office called stating that patient was not sure why she was getting referred to eye care for her decrease in vision. Patient told them that she just went to [employer] and bought a pair of glasses.

## 2015-05-10 NOTE — [facility] Note (Signed)
Pt states she went to [facility] last Friday, dx'd with ovarian cyst.  Was told to come to [facility]U if she has bleeding or pain.  Has had heavy bleeding for the last 3 hours, also LLQ pain for last 3 hours.  Pt very fidgety with rapid speech.

## 2015-05-10 NOTE — Telephone Encounter (Signed)
Pt called requesting lab results. Please review.  Thank you.

## 2015-05-10 NOTE — Discharge Instructions (Signed)
Pelvic Pain, Female °Female pelvic pain can be caused by many different things and start from a variety of places. Pelvic pain refers to pain that is located in the lower half of the abdomen and between your hips. The pain may occur over a short period of time (acute) or may be reoccurring (chronic). The cause of pelvic pain may be related to disorders affecting the female reproductive organs (gynecologic), but it may also be related to the bladder, kidney stones, an intestinal complication, or muscle or skeletal problems. Getting help right away for pelvic pain is important, especially if there has been severe, sharp, or a sudden onset of unusual pain. It is also important to get help right away because some types of pelvic pain can be life threatening.  °CAUSES  °Below are only some of the causes of pelvic pain. The causes of pelvic pain can be in one of several categories.  °· Gynecologic. °¨ Pelvic inflammatory disease. °¨ Sexually transmitted infection. °¨ Ovarian cyst or a twisted ovarian ligament (ovarian torsion). °¨ Uterine lining that grows outside the uterus (endometriosis). °¨ Fibroids, cysts, or tumors. °¨ Ovulation. °· Pregnancy. °¨ Pregnancy that occurs outside the uterus (ectopic pregnancy). °¨ Miscarriage. °¨ Labor. °¨ Abruption of the placenta or ruptured uterus. °· Infection. °¨ Uterine infection (endometritis). °¨ Bladder infection. °¨ Diverticulitis. °¨ Miscarriage related to a uterine infection (septic abortion). °· Bladder. °¨ Inflammation of the bladder (cystitis). °¨ Kidney stone(s). °· Gastrointestinal. °¨ Constipation. °¨ Diverticulitis. °· Neurologic. °¨ Trauma. °¨ Feeling pelvic pain because of mental or emotional causes (psychosomatic). °· Cancers of the bowel or pelvis. °EVALUATION  °Your caregiver will want to take a careful history of your concerns. This includes recent changes in your health, a careful gynecologic history of your periods (menses), and a sexual history. Obtaining  your family history and medical history is also important. Your caregiver may suggest a pelvic exam. A pelvic exam will help identify the location and severity of the pain. It also helps in the evaluation of which organ system may be involved. In order to identify the cause of the pelvic pain and be properly treated, your caregiver may order tests. These tests may include:  °· A pregnancy test. °· Pelvic ultrasonography. °· An X-ray exam of the abdomen. °· A urinalysis or evaluation of vaginal discharge. °· Blood tests. °HOME CARE INSTRUCTIONS  °· Only take over-the-counter or prescription medicines for pain, discomfort, or fever as directed by your caregiver.   °· Rest as directed by your caregiver.   °· Eat a balanced diet.   °· Drink enough fluids to make your urine clear or pale yellow, or as directed.   °· Avoid sexual intercourse if it causes pain.   °· Apply warm or cold compresses to the lower abdomen depending on which one helps the pain.   °· Avoid stressful situations.   °· Keep a journal of your pelvic pain. Write down when it started, where the pain is located, and if there are things that seem to be associated with the pain, such as food or your menstrual cycle. °· Follow up with your caregiver as directed.   °SEEK MEDICAL CARE IF: °· Your medicine does not help your pain. °· You have abnormal vaginal discharge. °SEEK IMMEDIATE MEDICAL CARE IF:  °· You have heavy bleeding from the vagina.   °· Your pelvic pain increases.   °· You feel light-headed or faint.   °· You have chills.   °· You have pain with urination or blood in your urine.   °· You have uncontrolled   diarrhea or vomiting.   °· You have a fever or persistent symptoms for more than 3 days. °· You have a fever and your symptoms suddenly get worse.   °· You are being physically or sexually abused. °  °This information is not intended to replace advice given to you by your health care provider. Make sure you discuss any questions you have with  your health care provider. °  °Document Released: 12/20/2003 Document Revised: 10/13/2014 Document Reviewed: 05/14/2011 °Elsevier Interactive Patient Education ©2016 Elsevier Inc. ° °

## 2015-05-10 NOTE — MAU Provider Note (Signed)
Chief Complaint: Vaginal Bleeding and Abdominal Pain   First Provider Initiated Contact with Patient 05/10/15 1844      SUBJECTIVE HPI: [name] is a 25 y.o. G1P0010 who presents to maternity admissions reporting LLQ pain starting today at noon followed by vaginal bleeding 1 hour later.  The pain is sharp and constant and gradually worsening.  Nothing alleviates or worsens the pain.  It is not associated with other symptoms.  It does not radiate.  She has not tried any medications.  Her bleeding is a more than her usual periods, requiring a pad every 2-3 hours. She is concerned she is pregnant and having a miscarriage.  Her LMP was 3/3. She had recent visit to [facility] on 3/31 for abdominal pain and finding of hemorrhagic ovarian cyst on right.  Of note, her istat quant hcg was 11.4 on 3/31.   She denies vaginal itching/burning, urinary symptoms, h/a, dizziness, n/v, or fever/chills.     HPI  Past Medical History  Diagnosis Date  . Asthma   . Depression   . Schizoaffective disorder, bipolar type ([place])   . Autism   . Essential tremor   . Chronic constipation   . Acid reflux   . Drug-seeking behavior   . Seizures [facility])    Past Surgical History  Procedure Laterality Date  . Mouth surgery     Social History   Social History  . Marital Status: Single    Spouse Name: N/A  . Number of Children: 0  . Years of Education: N/A   Occupational History  . disability    Social History Main Topics  . Smoking status: Never Smoker   . Smokeless tobacco: Not on file  . Alcohol Use: No  . Drug Use: No  . Sexual Activity: Yes    Birth Control/ Protection: None   Other Topics Concern  . Not on file   Social History Narrative   Marital status: married; guardian is Furniture conservator/restorer      Children: none      Lives: with husband in apartment      Employment:  Disability      Tobacco: quit smoking; smoked for two years.      Alcohol ;none      Drugs: none           No current  facility-administered medications on file prior to encounter.   Current Outpatient Prescriptions on File Prior to Encounter  Medication Sig Dispense Refill  . acetaminophen (TYLENOL) 500 MG tablet Take 1,000 mg by mouth every 6 (six) hours as needed for moderate pain.    Marland Kitchen albuterol (PROVENTIL HFA;VENTOLIN HFA) 108 (90 Base) MCG/ACT inhaler Inhale 1-2 puffs into the lungs every 6 (six) hours as needed for wheezing or shortness of breath. Reported on 05/05/2015    . ARIPiprazole (ABILIFY) 15 MG tablet Take 15 mg by mouth daily.    Marland Kitchen aspirin-acetaminophen-caffeine (PAMPRIN MAX) 250-250-65 MG tablet Take 2 tablets by mouth every 6 (six) hours as needed for headache or migraine. Reported on 05/05/2015    . chlorproMAZINE (THORAZINE) 25 MG tablet Take 25 mg by mouth 4 (four) times daily as needed (for agitation). Reported on 05/05/2015    . cholecalciferol (VITAMIN D-400) 400 UNITS TABS tablet Take 800 Units by mouth daily.    Marland Kitchen docusate sodium (COLACE) 100 MG capsule Take 1 capsule (100 mg total) by mouth at bedtime. (Patient taking differently: Take 100 mg by mouth every morning. ) 10 capsule 0  .  fluticasone (FLOVENT HFA) 220 MCG/ACT inhaler Inhale into the lungs 2 (two) times daily.    . meloxicam (MOBIC) 15 MG tablet Take 1 tablet (15 mg total) by mouth daily. 30 tablet 2  . pantoprazole (PROTONIX) 40 MG tablet Take 1 tablet (40 mg total) by mouth daily.    . prazosin (MINIPRESS) 1 MG capsule Take 1 capsule (1 mg total) by mouth at bedtime. (Patient taking differently: Take 2 mg by mouth at bedtime. )    . ramelteon (ROZEREM) 8 MG tablet Take 8 mg by mouth at bedtime as needed for sleep.    Marland Kitchen senna (SENOKOT) 8.6 MG TABS tablet Take 2 tablets by mouth at bedtime.    . Doxylamine-Pyridoxine 10-10 MG TBEC Take 2 tablets by mouth at bedtime. (Patient not taking: Reported on 05/10/2015) 60 tablet 1  . HYDROcodone-acetaminophen (NORCO/VICODIN) 5-325 MG tablet Take 1-2 tablets by mouth every 6 (six) hours as  needed for severe pain. (Patient not taking: Reported on 05/10/2015) 6 tablet 0  . ibuprofen (ADVIL,MOTRIN) 800 MG tablet Take 1 tablet (800 mg total) by mouth 3 (three) times daily. (Patient not taking: Reported on 05/10/2015) 21 tablet 0  . predniSONE (DELTASONE) 20 MG tablet Take 2 tablets (40 mg total) by mouth daily. (Patient not taking: Reported on 05/10/2015) 10 tablet 0   Allergies  Allergen Reactions  . Bee Venom Anaphylaxis  . Coconut Flavor Anaphylaxis  . Geodon [Ziprasidone Hcl] Other (See Comments)    Paralysis of mouth  . Haloperidol And Related Other (See Comments)    Paralysis of mouth   . Lithium Other (See Comments)    Seizure like muscle spasms   . Oxycodone Other (See Comments)    hallucinations   . Seroquel [Quetiapine Fumarate] Other (See Comments)    TOO STRONG  . Prilosec [Omeprazole] Nausea And Vomiting  . Milk-Related Compounds Nausea And Vomiting    Patient can eat dairy but can not drink "straight milk".  Marland Kitchen Percocet [Oxycodone-Acetaminophen] Other (See Comments)    Causes hallucinations.  . Shellfish Allergy Swelling  . Tegretol [Carbamazepine] Nausea And Vomiting    ROS:  Review of Systems  Constitutional: Negative for fatigue.  Respiratory: Negative for shortness of breath.   Cardiovascular: Negative for chest pain.  Genitourinary: Negative for difficulty urinating and vaginal pain.  Neurological: Negative for dizziness.  Psychiatric/Behavioral: Negative.      I have reviewed patient's Past Medical Hx, Surgical Hx, Family Hx, Social Hx, medications and allergies.   Physical Exam  Patient Vitals for the past 24 hrs:  BP Temp Pulse Resp  05/10/15 1528 (!) 117/50 mmHg - 96 18   Constitutional: Well-developed, well-nourished female in no acute distress.  Cardiovascular: normal rate Respiratory: normal effort GI: Abd soft, non-tender. No rebound tenderness or guarding. Pos BS x 4 MS: Extremities nontender, no edema, normal ROM Neurologic: Alert  and oriented x 4.  GU: Neg CVAT.  PELVIC EXAM: Deferred, done 05/06/15  LAB RESULTS Results for orders placed or performed during the hospital encounter of 05/10/15 (from the past 24 hour(s))  Urinalysis, Routine w reflex microscopic (not at [facility])     Status: Abnormal   Collection Time: 05/10/15  3:35 PM  Result Value Ref Range   Color, Urine YELLOW YELLOW   APPearance CLEAR CLEAR   Specific Gravity, Urine >1.030 (H) 1.005 - 1.030   pH 5.0 5.0 - 8.0   Glucose, UA NEGATIVE NEGATIVE mg/dL   Hgb urine dipstick MODERATE (A) NEGATIVE   Bilirubin Urine  NEGATIVE NEGATIVE   Ketones, ur NEGATIVE NEGATIVE mg/dL   Protein, ur NEGATIVE NEGATIVE mg/dL   Nitrite NEGATIVE NEGATIVE   Leukocytes, UA NEGATIVE NEGATIVE  Urine microscopic-add on     Status: Abnormal   Collection Time: 05/10/15  3:35 PM  Result Value Ref Range   Squamous Epithelial / LPF 0-5 (A) NONE SEEN   WBC, UA 0-5 0 - 5 WBC/hpf   RBC / HPF 0-5 0 - 5 RBC/hpf   Bacteria, UA MANY (A) NONE SEEN   Urine-Other MUCOUS PRESENT   Pregnancy, urine POC     Status: None   Collection Time: 05/10/15  3:51 PM  Result Value Ref Range   Preg Test, Ur NEGATIVE NEGATIVE  CBC     Status: Abnormal   Collection Time: 05/10/15  7:23 PM  Result Value Ref Range   WBC 11.3 (H) 4.0 - 10.5 K/uL   RBC 4.31 3.87 - 5.11 MIL/uL   Hemoglobin 12.2 12.0 - 15.0 g/dL   HCT 36.4 36.0 - 46.0 %   MCV 84.5 78.0 - 100.0 fL   MCH 28.3 26.0 - 34.0 pg   MCHC 33.5 30.0 - 36.0 g/dL   RDW 13.7 11.5 - 15.5 %   Platelets 231 150 - 400 K/uL       IMAGING   US Pelvis Complete  05/06/2015  CLINICAL DATA:  Pelvic pain in a female EXAM: TRANSABDOMINAL AND TRANSVAGINAL ULTRASOUND OF PELVIS TECHNIQUE: Both transabdominal and transvaginal ultrasound examinations of the pelvis were performed. Transabdominal technique was performed for global imaging of the pelvis including uterus, ovaries, adnexal regions, and pelvic cul-de-sac. It was necessary to proceed with  endovaginal exam following the transabdominal exam to visualize the uterus, endometrium and ovaries. Transabdominal images are severely impaired by decompressed urinary bladder and lack of an adequate acoustic window. COMPARISON:  CT abdomen and pelvis 03/23/2014 FINDINGS: Uterus Measurements: 6.4 x 3.5 x 4.2 cm. Appears retroverted. No focal mass lesion. Endometrium Thickness: 10 mm thick, normal. No endometrial fluid or focal abnormality Right ovary Measurements: 1.9 x 2.2 x 2.2 cm. Question small hemorrhagic cyst 1.4 x 1.3 x 1.5 cm. Otherwise normal appearance. Left ovary Measurements: 2.5 x 1.5 x 1.3 cm. Normal morphology without mass Other findings Trace free pelvic fluid. No additional adnexal masses. IMPRESSION: Probable small hemorrhagic cyst RIGHT ovary 1.5 cm greatest size. Otherwise negative exam. Electronically Signed   By: Lavonia Dana M.D.   On: 05/06/2015 22:47   US Transvaginal Non-ob  05/10/2015  CLINICAL DATA:  Patient diagnosed with a small ovarian cyst on 05/06/2015 now with heavy vaginal bleeding and left lower quadrant pain for 3 hours. No known injury. Subsequent encounter. EXAM: ULTRASOUND PELVIS TRANSVAGINAL TECHNIQUE: Transvaginal ultrasound examination of the pelvis was performed including evaluation of the uterus, ovaries, adnexal regions, and pelvic cul-de-sac. COMPARISON:  Pelvic ultrasound 05/06/2015. FINDINGS: Uterus Measurements: 7.3 x 3.6 x 4.1 cm. No fibroids or other mass visualized. Endometrium Thickness: 0.6 cm.  No focal abnormality visualized. Right ovary Measurements: 2.3 x 1.5 x 1.6 cm. Normal appearance/no adnexal mass. Hemorrhagic ovarian cyst seen on the prior study is difficult to appreciate on this exam. Left ovary Measurements: 2.0 x 1.7 x 2.2 cm. Normal appearance/no adnexal mass. Other findings:  No abnormal free fluid. IMPRESSION: Negative exam. Small hemorrhagic ovarian cyst seen on the prior study is difficult to appreciate today. Electronically Signed   By:  Inge Rise M.D.   On: 05/10/2015 21:03     MAU Management/MDM: Ordered labs and  [name] and reviewed results.  Consult Dr Harolyn Rutherford.  With recent hcg at [facility] on 3/31 of 11.4 only and hcg of <3 today, unlikely recent pregnancy, most likely false positive on istat hcg.  Likely menstrual bleeding/cramping today.  Urine sent for culture.  Pt to f/u in [place] as needed for Gyn concerns, return to MAU or ED for emergencies.  Pt declined pain management in MAU or Rx for home.  Pt stable at time of discharge.   ASSESSMENT 1. Pelvic pain affecting pregnancy in first trimester, antepartum   2. Pelvic pain affecting pregnancy in first trimester, antepartum   3. Abdominal pain, LLQ (left lower quadrant)   4. Abnormal uterine bleeding (AUB)     PLAN Discharge home  Follow-up Information    Follow up with [facility].   Specialty:  Obstetrics and Gynecology   Why:  As needed, Return to MAU as needed for emergencies   Contact information:   [place] Certified Nurse-Midwife 05/10/2015  7:43 PM

## 2015-05-11 LAB — HIV ANTIBODY (ROUTINE TESTING W REFLEX): HIV SCREEN 4TH GENERATION: NONREACTIVE

## 2015-05-19 ENCOUNTER — Ambulatory Visit (INDEPENDENT_AMBULATORY_CARE_PROVIDER_SITE_OTHER): Payer: Medicare Other | Admitting: Neurology

## 2015-05-19 ENCOUNTER — Encounter: Payer: Self-pay | Admitting: Neurology

## 2015-05-19 ENCOUNTER — Encounter: Payer: Self-pay | Admitting: Family Medicine

## 2015-05-19 DIAGNOSIS — G43709 Chronic migraine without aura, not intractable, without status migrainosus: Secondary | ICD-10-CM | POA: Diagnosis not present

## 2015-05-19 DIAGNOSIS — R569 Unspecified convulsions: Secondary | ICD-10-CM | POA: Diagnosis not present

## 2015-05-19 DIAGNOSIS — F250 Schizoaffective disorder, bipolar type: Secondary | ICD-10-CM

## 2015-05-19 DIAGNOSIS — IMO0002 Reserved for concepts with insufficient information to code with codable children: Secondary | ICD-10-CM

## 2015-05-19 MED ORDER — DIVALPROEX SODIUM ER 500 MG PO TB24: 500.0000 mg | ORAL_TABLET | Freq: Every day | ORAL | Status: DC

## 2015-05-19 MED ORDER — DIVALPROEX SODIUM ER 500 MG PO TB24: 1000.0000 mg | ORAL_TABLET | Freq: Every day | ORAL | Status: DC

## 2015-05-19 NOTE — Progress Notes (Signed)
[name]  PATIENT: [name] DOB: [birth year]  REFERRING DOCTOR OR PCP:  She is in the process of changing primary care. She sees [place] for psychiatry (phone:    [phone]) SOURCE: Patient, husband records in EMR  _________________________________   HISTORICAL  CHIEF COMPLAINT:  Chief Complaint  Patient presents with  . Headaches    [name] is here for eval of headaches and seizures.  Sts. on April 15, 2015, she was asleep, fell out of bed--from about 2-3 feet, hitting the left side of her  head on a hardwood floor.  Fall was unwitnessed, but she believes she had a loc for approx. 1 min.  She was seen and treated at [facility] ER, sts. was dx. with concussion.  Sts. she also has had mult. episodes of  sz. like activity, usually at night.  Husband [name] describes full body convulsions, urinary incontinence.  [name] sts. her younger   . History of Head Injury    younger brother has a hx. of sz. She also c/o persistent h/a's since concussion/fim    HISTORY OF PRESENT ILLNESS:  [name] is a 25 year old woman with schizoaffective disorder who has had multiple spells of altered consciousness. She also has frequent, near daily, migraine headaches.  She reports a concussion 04/15/15 after hitting her head on the hardwood floor when she fell out of bed.     She had LOC x 1 minute or so.  She went to [facility] ED.  She had nausea, vomiting and felt confusion x 1 week afterwardsand memory loss.    She has had other head injuries in the past but without loss of consciousness  She states she has had multiple (1 to 2 a day) seizures since her head injury last month.   She gets warning for a few seconds with intense anxiety and then she loses consciousness.    Her spells last 20 seconds to 4-5 minutes.  She has convulsions and often urinary incontinence.    She feels she is groggy x 1-3 minutes after a seizure and then is back to her baseline a few minutes later.     They have occurred while awake and while asleep.         Her brother has a h/o seizures since age 45 with staring spells.     She reports a history of migraines x many years.    She reports pain is throbbing and mostly in the forehead bilaterally , towards the vertex.    She states migraines occur on a daily basis.   She gets nausea but not vomiting.   She has photophobia and phonophobia.    She feels dizzy when they occur.   She has never been   She has a complicated psychiatric history and has been diagnosed with Bipolar disease and with schizoaffective disease.   She is currently on Abilify and thorazine for these disorders and Prazosin for PTSD.  She also reports a lot of anxiety.   She sees Transport planner for [employer]   She was seeing Administrator as PCP and is going to be seeing [facility]) soon.        REVIEW OF SYSTEMS: Constitutional: No fevers, chills, sweats, or change in appetite.   Poor sleep Eyes: No visual changes, double vision, eye pain Ear, nose and throat: No hearing loss, ear pain, nasal congestion, sore throat Cardiovascular: No chest pain, palpitations Respiratory: No shortness of breath at rest or with  exertion.   No wheezes GastrointestinaI: No nausea, vomiting, diarrhea, abdominal pain, fecal incontinence Genitourinary: No dysuria, urinary retention or frequency.  No nocturia. Musculoskeletal: Some neck pain and back pain Integumentary: No rash, pruritus, skin lesions Neurological: as above Psychiatric: see above Endocrine: No palpitations, diaphoresis, change in appetite, change in weigh or increased thirst Hematologic/Lymphatic: No anemia, purpura, petechiae. Allergic/Immunologic: No itchy/runny eyes, nasal congestion, recent allergic reactions, rashes  ALLERGIES: Allergies  Allergen Reactions  . Bee Venom Anaphylaxis  . Coconut Flavor Anaphylaxis  . Geodon [Ziprasidone Hcl] Other (See Comments)    Paralysis of mouth  . Haloperidol  And Related Other (See Comments)    Paralysis of mouth   . Lithium Other (See Comments)    Seizure like muscle spasms   . Oxycodone Other (See Comments)    hallucinations   . Seroquel [Quetiapine Fumarate] Other (See Comments)    TOO STRONG  . Prilosec [Omeprazole] Nausea And Vomiting  . Milk-Related Compounds Nausea And Vomiting    Patient can eat dairy but can not drink "straight milk".  Marland Kitchen Percocet [Oxycodone-Acetaminophen] Other (See Comments)    Causes hallucinations.  . Shellfish Allergy Swelling  . Tegretol [Carbamazepine] Nausea And Vomiting    HOME MEDICATIONS:  Current outpatient prescriptions:  .  acetaminophen (TYLENOL) 500 MG tablet, Take 1,000 mg by mouth every 6 (six) hours as needed for moderate pain., Disp: , Rfl:  .  albuterol (PROVENTIL HFA;VENTOLIN HFA) 108 (90 Base) MCG/ACT inhaler, Inhale 1-2 puffs into the lungs every 6 (six) hours as needed for wheezing or shortness of breath. Reported on 05/05/2015, Disp: , Rfl:  .  ARIPiprazole (ABILIFY) 15 MG tablet, Take 15 mg by mouth daily., Disp: , Rfl:  .  cholecalciferol (VITAMIN D-400) 400 UNITS TABS tablet, Take 800 Units by mouth daily., Disp: , Rfl:  .  docusate sodium (COLACE) 100 MG capsule, Take 1 capsule (100 mg total) by mouth at bedtime. (Patient taking differently: Take 100 mg by mouth every morning. ), Disp: 10 capsule, Rfl: 0 .  fluticasone (FLOVENT HFA) 220 MCG/ACT inhaler, Inhale into the lungs 2 (two) times daily., Disp: , Rfl:  .  ibuprofen (ADVIL,MOTRIN) 800 MG tablet, Take 1 tablet (800 mg total) by mouth 3 (three) times daily., Disp: 21 tablet, Rfl: 0 .  meloxicam (MOBIC) 15 MG tablet, Take 1 tablet (15 mg total) by mouth daily., Disp: 30 tablet, Rfl: 2 .  Prenatal Vit-Fe Fumarate-FA (PRENATAL MULTIVITAMIN) TABS tablet, Take 1 tablet by mouth at bedtime., Disp: , Rfl:  .  senna (SENOKOT) 8.6 MG TABS tablet, Take 2 tablets by mouth at bedtime., Disp: , Rfl:  .  Biotin (BIOTIN MAXIMUM STRENGTH) 10 MG  TABS, Take 1 tablet by mouth at bedtime., Disp: , Rfl:  .  divalproex (DEPAKOTE ER) 500 MG 24 hr tablet, Take 2 tablets (1,000 mg total) by mouth daily., Disp: 60 tablet, Rfl: 5 .  prazosin (MINIPRESS) 1 MG capsule, Take 1 capsule (1 mg total) by mouth at bedtime. (Patient taking differently: Take 2 mg by mouth at bedtime. ), Disp: , Rfl:   PAST MEDICAL HISTORY: Past Medical History  Diagnosis Date  . Asthma   . Depression   . Schizoaffective disorder, bipolar type (HCC)   . Autism   . Essential tremor   . Chronic constipation   . Acid reflux   . Drug-seeking behavior   . Seizures (HCC)     PAST SURGICAL HISTORY: Past Surgical History  Procedure Laterality Date  . Mouth surgery  FAMILY HISTORY: Family History  Problem Relation Age of Onset  . Mental illness Father   . Asthma Father   . PDD Brother   . Seizures Brother     SOCIAL HISTORY:  Social History   Social History  . Marital Status: Single    Spouse Name: N/A  . Number of Children: 0  . Years of Education: N/A   Occupational History  . disability    Social History Main Topics  . Smoking status: Never Smoker   . Smokeless tobacco: Not on file  . Alcohol Use: No  . Drug Use: No  . Sexual Activity: Yes    Birth Control/ Protection: None   Other Topics Concern  . Not on file   Social History Narrative   Marital status: married; guardian is Midwife      Children: none      Lives: with husband in apartment      Employment:  Disability      Tobacco: quit smoking; smoked for two years.      Alcohol ;none      Drugs: none             PHYSICAL EXAM  There were no vitals filed for this visit.  There is no weight on file to calculate BMI.   General: The patient is well-developed and well-nourished and in no acute distress  Eyes:  Funduscopic exam shows normal optic discs and retinal vessels.  Neck: The neck is supple, no carotid bruits are noted.  The neck is  nontender.  Cardiovascular: The heart has a regular rate and rhythm with a normal S1 and S2. There were no murmurs, gallops or rubs. Lungs are clear to auscultation.  Skin: Extremities are without significant edema.  Musculoskeletal:  Back is nontender  Neurologic Exam  Mental status: The patient is alert and oriented x 3 at the time of the examination. The patient has apparent normal recent and remote memory, with an apparently normal attention span and concentration ability.   Speech is normal.  Cranial nerves: Extraocular movements are full. Pupils are equal, round, and reactive to light and accomodation.  Visual fields are full.  Facial symmetry is present. There is good facial sensation to soft touch bilaterally.Facial strength is normal.  Trapezius and sternocleidomastoid strength is normal. No dysarthria is noted.  The tongue is midline, and the patient has symmetric elevation of the soft palate. No obvious hearing deficits are noted.  Motor:  Muscle bulk is normal.   Tone is normal. Strength is  5 / 5 in all 4 extremities.   Sensory: Sensory testing is intact to pinprick, soft touch and vibration sensation in arms and she reports slightly reduced vibratory sensation in the left leg compared to the right..  Coordination: Cerebellar testing reveals good finger-nose-finger and heel-to-shin bilaterally.  Gait and station: Station is normal.   Gait is normal. Tandem gait is wide. Romberg is negative.   Reflexes: Deep tendon reflexes are symmetric and normal bilaterally.   Plantar responses are flexor.    DIAGNOSTIC DATA (LABS, IMAGING, TESTING) - I reviewed patient records, labs, notes, testing and imaging myself where available.  Lab Results  Component Value Date   WBC 11.3* 05/10/2015   HGB 12.2 05/10/2015   HCT 36.4 05/10/2015   MCV 84.5 05/10/2015   PLT 231 05/10/2015      Component Value Date/Time   NA 138 05/06/2015 2008   K 3.8 05/06/2015 2008   CL 108 05/06/2015  2008   CO2 21* 05/06/2015 2008   GLUCOSE 92 05/06/2015 2008   BUN 9 05/06/2015 2008   CREATININE 0.70 05/06/2015 2008   CREATININE 0.60 05/05/2015 1051   CALCIUM 9.3 05/06/2015 2008   PROT 6.9 05/06/2015 2008   ALBUMIN 4.2 05/06/2015 2008   AST 25 05/06/2015 2008   ALT 21 05/06/2015 2008   ALKPHOS 54 05/06/2015 2008   BILITOT 0.4 05/06/2015 2008   GFRNONAA >60 05/06/2015 2008   GFRAA >60 05/06/2015 2008    Lab Results  Component Value Date   TSH 1.20 05/05/2015       ASSESSMENT AND PLAN  Convulsions, unspecified convulsion type (HCC) - Plan: EEG adult, MR Brain Wo Contrast, Valproic acid level, CBC with Differential/Platelet, Comprehensive metabolic panel  Chronic migraine w/o aura w/o status migrainosus, not intractable - Plan: EEG adult, MR Brain Wo Contrast, Valproic acid level, CBC with Differential/Platelet, Comprehensive metabolic panel  Schizoaffective disorder, bipolar type (HCC)  Chronic migraine   In summary, [name] is a 25 year old woman with schizoaffective disorder, bipolar type who has had multiple spells since a mild head injury last month. The description of the events is consistent with genaralized tonic-clonic convulsions, possibly secondarily generalizing as she feels pain right before one of the spells occurs. The spells could also represent psychogenic seizures.   She also has chronic migrainous headaches.   She is on Abilify daily and Thorazine when necessary.   Both can reduce the seizure threshold, Thorazine more so than Abilify.  1.    EEG to determine if there is any epileptiform activity.   MRI of the brain without contrast 2.    Depakote 500 mg ER, 2 pills daily.   We will check blood work for a Depakote level, CBC and CMP in 2 weeks. 3.   She currently is taking Thorazine on an as-needed basis and I will have her hold this for the time being. She states that she has an appointment to see [facility] in the next week or 2. 4.    She is advised to  try to get ample sleep as sleep deprivation can also lower the seizure threshold 5.     He will return to see [name] in 2 months or call sooner if new or worsening neurologic symptoms.   Stop thorazine eeg Mri depakote   Takeshi Teasdale A. Epimenio Foot, MD, PhD 05/19/2015, 2:29 PM Certified in Neurology, Clinical Neurophysiology, Sleep Medicine, Pain Medicine and Neuroimaging  [facility] [address] [place], [place] [address] [phone]

## 2015-05-19 NOTE — Patient Instructions (Signed)
Start taking Depakote 500 mg twice a day.  In 2 weeks I will do to return to our office to get some blood work done.   Stop the Thorazine. He can continue to take Abilify.  We will schedule an EEG and an MRI.

## 2015-05-23 ENCOUNTER — Other Ambulatory Visit: Payer: Self-pay

## 2015-05-31 ENCOUNTER — Other Ambulatory Visit (INDEPENDENT_AMBULATORY_CARE_PROVIDER_SITE_OTHER): Payer: Self-pay

## 2015-05-31 ENCOUNTER — Ambulatory Visit
Admission: RE | Admit: 2015-05-31 | Discharge: 2015-05-31 | Disposition: A | Discharge status: Home / Self Care | Payer: Medicare Other | Source: Ambulatory Visit | Attending: Neurology | Admitting: Neurology

## 2015-05-31 DIAGNOSIS — R569 Unspecified convulsions: Secondary | ICD-10-CM

## 2015-05-31 DIAGNOSIS — Z0289 Encounter for other administrative examinations: Secondary | ICD-10-CM

## 2015-05-31 DIAGNOSIS — G43709 Chronic migraine without aura, not intractable, without status migrainosus: Secondary | ICD-10-CM

## 2015-06-01 ENCOUNTER — Other Ambulatory Visit: Payer: Medicare Other

## 2015-06-01 LAB — COMPREHENSIVE METABOLIC PANEL
ALT: 20 IU/L (ref 0–32)
AST: 18 IU/L (ref 0–40)
Albumin/Globulin Ratio: 1.8 (ref 1.2–2.2)
Albumin: 4.7 g/dL (ref 3.5–5.5)
Alkaline Phosphatase: 56 IU/L (ref 39–117)
BILIRUBIN TOTAL: 0.3 mg/dL (ref 0.0–1.2)
BUN/Creatinine Ratio: 8 — ABNORMAL LOW (ref 9–23)
BUN: 6 mg/dL (ref 6–20)
CHLORIDE: 101 mmol/L (ref 96–106)
CO2: 20 mmol/L (ref 18–29)
Calcium: 9.9 mg/dL (ref 8.7–10.2)
Creatinine, Ser: 0.77 mg/dL (ref 0.57–1.00)
GFR calc non Af Amer: 107 mL/min/{1.73_m2} (ref >59)
GFR, EST AFRICAN AMERICAN: 123 mL/min/{1.73_m2} (ref >59)
GLUCOSE: 100 mg/dL — AB (ref 65–99)
Globulin, Total: 2.6 g/dL (ref 1.5–4.5)
Potassium: 4.9 mmol/L (ref 3.5–5.2)
Sodium: 140 mmol/L (ref 134–144)
TOTAL PROTEIN: 7.3 g/dL (ref 6.0–8.5)

## 2015-06-01 LAB — CBC WITH DIFFERENTIAL/PLATELET
BASOS ABS: 0 10*3/uL (ref 0.0–0.2)
Basos: 0 %
EOS (ABSOLUTE): 0 10*3/uL (ref 0.0–0.4)
Eos: 0 %
Hematocrit: 41.7 % (ref 34.0–46.6)
Hemoglobin: 13.4 g/dL (ref 11.1–15.9)
IMMATURE GRANS (ABS): 0 10*3/uL (ref 0.0–0.1)
IMMATURE GRANULOCYTES: 0 %
LYMPHS: 35 %
Lymphocytes Absolute: 2.9 10*3/uL (ref 0.7–3.1)
MCH: 27.5 pg (ref 26.6–33.0)
MCHC: 32.1 g/dL (ref 31.5–35.7)
MCV: 86 fL (ref 79–97)
Monocytes Absolute: 0.4 10*3/uL (ref 0.1–0.9)
Monocytes: 5 %
NEUTROS ABS: 4.9 10*3/uL (ref 1.4–7.0)
NEUTROS PCT: 60 %
PLATELETS: 258 10*3/uL (ref 150–379)
RBC: 4.88 x10E6/uL (ref 3.77–5.28)
RDW: 13.1 % (ref 12.3–15.4)
WBC: 8.3 10*3/uL (ref 3.4–10.8)

## 2015-06-01 LAB — VALPROIC ACID LEVEL: VALPROIC ACID LVL: 84 ug/mL (ref 50–100)

## 2015-06-03 ENCOUNTER — Telehealth: Payer: Self-pay | Admitting: *Deleted

## 2015-06-03 NOTE — Telephone Encounter (Signed)
-----   Message from Britt Bottom, MD sent at 06/02/2015  5:24 PM EDT ----- MRI brain is normal

## 2015-06-03 NOTE — Telephone Encounter (Signed)
I have spoken with [name] this morning, and per RAS, advised that mri brain is normal.  She verbalized understanding of same/fim

## 2015-06-06 ENCOUNTER — Telehealth: Payer: Self-pay | Admitting: *Deleted

## 2015-06-06 ENCOUNTER — Other Ambulatory Visit: Payer: Medicare Other

## 2015-06-06 NOTE — Telephone Encounter (Signed)
-----   Message from Britt Bottom, MD sent at 06/04/2015  3:27 PM EDT ----- Please let her know that the lab work looks good.    Continue Depakote at the current dose.

## 2015-06-06 NOTE — Telephone Encounter (Signed)
I have spoken with [name] this afternoon, and per RAS, advised that labwork done in our office looks good; she should continue Depakote at the current dose.  She verbalized understanding of same/fim

## 2015-06-09 NOTE — ED Provider Notes (Signed)
CSN: [id number]     Arrival date & time 05/06/15  1737 History   First MD Initiated Contact with Patient 05/06/15 2105     Chief Complaint  Patient presents with  . Headache     (Consider location/radiation/quality/duration/timing/severity/associated sxs/prior Treatment) Patient is a 25 y.o. female presenting with headaches. The history is provided by the patient. The history is limited by the condition of the patient (Pt difficult historian with rapid, tangential speech, difficult to redirect).  Headache    [name] is a 25 y/o female with hx asthma, depression, schizoaffective disorder, seizures, recent closed head injury with headaches, she presents today with multiple complaints including continued HA with associated photophobia, difficulty concentrating, unchanged since her head injury, N/V - unable to specify frequency and onset, diffuse lower abdominal discomfort with intermittent cramping, dysuria. Adominal pain is across her lower abdomen, but worse in the LLQ, described as sharp and stabbing, intermittent 8/10 pain.  No active vomiting currently.  No fever, flank pain, vaginal discharge, hematuria.  She is requesting pregnancy test, has taken home urine test that was negative.  LMP 04/09/15, she is sexually active with one partner, no birth control.    Head ache is 10/10 pain, located all over, throbbing and pounding, w/o alleviating factors.  She denies vision changes, hearing changes, syncope, fever, neck pain, weakness.  No CP, SOB, wheeze.  Past Medical History  Diagnosis Date  . Asthma   . Depression   . Schizoaffective disorder, bipolar type ([place])   . Autism   . Essential tremor   . Chronic constipation   . Acid reflux   . Drug-seeking behavior   . Seizures [facility])    Past Surgical History  Procedure Laterality Date  . Mouth surgery     Family History  Problem Relation Age of Onset  . Mental illness Father   . Asthma Father   . PDD Brother   . Seizures Brother     Social History  Substance Use Topics  . Smoking status: Never Smoker   . Smokeless tobacco: None  . Alcohol Use: No   OB History    Gravida Para Term Preterm AB TAB SAB Ectopic Multiple Living   1    1  1         Review of Systems  Neurological: Positive for headaches.  All other systems reviewed and are negative.     Allergies  Bee venom; Coconut flavor; Geodon; Haloperidol and related; Lithium; Oxycodone; Seroquel; Prilosec; Milk-related compounds; Percocet; Shellfish allergy; and Tegretol  Home Medications   Prior to Admission medications   Medication Sig Start Date End Date Taking? Authorizing Provider  acetaminophen (TYLENOL) 500 MG tablet Take 1,000 mg by mouth every 6 (six) hours as needed for moderate pain.   Yes Historical Provider, MD  albuterol (PROVENTIL HFA;VENTOLIN HFA) 108 (90 Base) MCG/ACT inhaler Inhale 1-2 puffs into the lungs every 6 (six) hours as needed for wheezing or shortness of breath. Reported on 05/05/2015   Yes Historical Provider, MD  ARIPiprazole (ABILIFY) 15 MG tablet Take 15 mg by mouth daily.   Yes Historical Provider, MD  cholecalciferol (VITAMIN D-400) 400 UNITS TABS tablet Take 800 Units by mouth daily.   Yes Historical Provider, MD  docusate sodium (COLACE) 100 MG capsule Take 1 capsule (100 mg total) by mouth at bedtime. Patient taking differently: Take 100 mg by mouth every morning.  03/29/14  Yes Niel Hummer, NP  fluticasone (FLOVENT HFA) 220 MCG/ACT inhaler Inhale into the  lungs 2 (two) times daily.   Yes Historical Provider, MD  meloxicam (MOBIC) 15 MG tablet Take 1 tablet (15 mg total) by mouth daily. 04/12/15  Yes Bryan R Hess, DO  prazosin (MINIPRESS) 1 MG capsule Take 1 capsule (1 mg total) by mouth at bedtime. Patient taking differently: Take 2 mg by mouth at bedtime.  03/29/14  Yes Niel Hummer, NP  senna (SENOKOT) 8.6 MG TABS tablet Take 2 tablets by mouth at bedtime.   Yes Historical Provider, MD  Biotin (BIOTIN MAXIMUM STRENGTH)  10 MG TABS Take 1 tablet by mouth at bedtime.    Historical Provider, MD  divalproex (DEPAKOTE ER) 500 MG 24 hr tablet Take 2 tablets (1,000 mg total) by mouth daily. 05/19/15   Britt Bottom, MD  ibuprofen (ADVIL,MOTRIN) 800 MG tablet Take 1 tablet (800 mg total) by mouth 3 (three) times daily. 05/06/15   Delsa Grana, PA-C  Prenatal Vit-Fe Fumarate-FA (PRENATAL MULTIVITAMIN) TABS tablet Take 1 tablet by mouth at bedtime.    Historical Provider, MD   BP 119/69 mmHg  Pulse 95  Temp(Src) 98.5 F (36.9 C) (Oral)  Resp 18  Ht 5\' 5"  (1.651 m)  Wt 76.856 kg  BMI 28.20 kg/m2  SpO2 100%  LMP 04/09/2015 Physical Exam  Constitutional: She is oriented to person, place, and time. She appears well-developed and well-nourished. No distress.  HENT:  Head: Normocephalic and atraumatic.  Nose: Nose normal.  Mouth/Throat: No oropharyngeal exudate.  Mucous membranes dry, no cyanosis  Eyes: Conjunctivae and EOM are normal. Pupils are equal, round, and reactive to light. Right eye exhibits no discharge. Left eye exhibits no discharge. No scleral icterus.  Neck: Normal range of motion. Neck supple. No JVD present. No tracheal deviation present. No thyromegaly present.  Neck supple, no rigidity, normal ROM  Cardiovascular: Normal rate, regular rhythm, normal heart sounds and intact distal pulses.  Exam reveals no gallop and no friction rub.   No murmur heard. Pulmonary/Chest: Effort normal and breath sounds normal. No accessory muscle usage or stridor. No respiratory distress. She has no decreased breath sounds. She has no wheezes. She has no rhonchi. She has no rales. She exhibits no tenderness.  Abdominal: Soft. Normal appearance and bowel sounds are normal. She exhibits no distension, no ascites and no mass. There is tenderness in the suprapubic area and left lower quadrant. There is no rigidity, no rebound, no guarding, no CVA tenderness, no tenderness at McBurney's point and negative Murphy's sign.   Genitourinary: Vagina normal and uterus normal. There is no rash or tenderness on the right labia. There is no rash or tenderness on the left labia. Cervix exhibits motion tenderness. Cervix exhibits no discharge and no friability. Right adnexum displays tenderness. Left adnexum displays tenderness.  Tenderness with pelvic exam, cervix tender Exam somewhat limited by pt's inability to hold still and relax thighs and pelvic floor   Musculoskeletal: Normal range of motion. She exhibits no edema or tenderness.  Lymphadenopathy:    She has no cervical adenopathy.  Neurological: She is alert and oriented to person, place, and time. She has normal strength and normal reflexes. She is not disoriented. No cranial nerve deficit or sensory deficit. She exhibits normal muscle tone. Coordination normal. GCS eye subscore is 4. GCS verbal subscore is 5. GCS motor subscore is 6.  Speech is clear, follows simple commands, sometimes needs redirection Major Cranial nerves without deficit, no facial droop Normal strength in upper and lower extremities bilaterally including dorsiflexion and plantar  flexion, strong and equal grip strength Sensation normal to light and sharp touch Moves extremities without ataxia, coordination intact Normal finger to nose and rapid alternating movements Neg romberg, no pronator drift Normal gait and balance  Skin: Skin is warm and dry. No rash noted. She is not diaphoretic. No cyanosis or erythema. No pallor.  Psychiatric: Judgment and thought content normal. Her affect is inappropriate. Her speech is rapid and/or pressured and tangential. Her speech is not slurred. She is hyperactive. She is not agitated, not aggressive, not slowed and not withdrawn. Cognition and memory are normal. She does not exhibit a depressed mood. She expresses no suicidal plans and no homicidal plans. She is inattentive.  Nursing note and vitals reviewed.   ED Course  Procedures (including critical care  time) Labs Review  Results for orders placed or performed during the hospital encounter of 05/06/15  Wet prep, genital  Result Value Ref Range   Yeast Wet Prep HPF POC NONE SEEN NONE SEEN   Trich, Wet Prep NONE SEEN NONE SEEN   Clue Cells Wet Prep HPF POC NONE SEEN NONE SEEN   WBC, Wet Prep HPF POC FEW (A) NONE SEEN   Sperm NONE SEEN   Urinalysis, Routine w reflex microscopic (not at [facility])  Result Value Ref Range   Color, Urine YELLOW YELLOW   APPearance CLOUDY (A) CLEAR   Specific Gravity, Urine 1.022 1.005 - 1.030   pH 7.0 5.0 - 8.0   Glucose, UA NEGATIVE NEGATIVE mg/dL   Hgb urine dipstick NEGATIVE NEGATIVE   Bilirubin Urine NEGATIVE NEGATIVE   Ketones, ur NEGATIVE NEGATIVE mg/dL   Protein, ur NEGATIVE NEGATIVE mg/dL   Nitrite NEGATIVE NEGATIVE   Leukocytes, UA NEGATIVE NEGATIVE  CBC with Differential  Result Value Ref Range   WBC 10.5 4.0 - 10.5 K/uL   RBC 4.72 3.87 - 5.11 MIL/uL   Hemoglobin 13.5 12.0 - 15.0 g/dL   HCT 40.0 36.0 - 46.0 %   MCV 84.7 78.0 - 100.0 fL   MCH 28.6 26.0 - 34.0 pg   MCHC 33.8 30.0 - 36.0 g/dL   RDW 13.3 11.5 - 15.5 %   Platelets 249 150 - 400 K/uL   Neutrophils Relative % 58 %   Neutro Abs 6.0 1.7 - 7.7 K/uL   Lymphocytes Relative 35 %   Lymphs Abs 3.7 0.7 - 4.0 K/uL   Monocytes Relative 7 %   Monocytes Absolute 0.7 0.1 - 1.0 K/uL   Eosinophils Relative 0 %   Eosinophils Absolute 0.0 0.0 - 0.7 K/uL   Basophils Relative 0 %   Basophils Absolute 0.0 0.0 - 0.1 K/uL  Comprehensive metabolic panel  Result Value Ref Range   Sodium 138 135 - 145 mmol/L   Potassium 3.8 3.5 - 5.1 mmol/L   Chloride 108 101 - 111 mmol/L   CO2 21 (L) 22 - 32 mmol/L   Glucose, Bld 92 65 - 99 mg/dL   BUN 9 6 - 20 mg/dL   Creatinine, Ser 0.70 0.44 - 1.00 mg/dL   Calcium 9.3 8.9 - 10.3 mg/dL   Total Protein 6.9 6.5 - 8.1 g/dL   Albumin 4.2 3.5 - 5.0 g/dL   AST 25 15 - 41 U/L   ALT 21 14 - 54 U/L   Alkaline Phosphatase 54 38 - 126 U/L   Total Bilirubin 0.4 0.3  - 1.2 mg/dL   GFR calc non Af Amer >60 >60 mL/min   GFR calc Af Amer >60 >60 mL/min  Anion gap 9 5 - 15  RPR  Result Value Ref Range   RPR Ser Ql Non Reactive Non Reactive  HIV antibody  Result Value Ref Range   HIV Screen 4th Generation wRfx Non Reactive Non Reactive  I-Stat Beta hCG blood, ED ([facility], [facility], [facility] only)  Result Value Ref Range   I-stat hCG, quantitative 11.4 (H) <5 mIU/mL   Comment 3          GC/Chlamydia probe amp  Result Value Ref Range   Chlamydia Negative    Neisseria gonorrhea Negative    Mr Brain Wo Contrast  06/01/2015  [name] NEUROIMAGING REPORT STUDY DATE: 05/31/15 PATIENT NAME: [name] DOB: [birth year] MRN: [id number] ORDERING CLINICIAN: Arlice Colt, MD PhD CLINICAL HISTORY: 25 year old female with convulsions and headaches. EXAM: MRI brain (without) TECHNIQUE: MRI of the brain without contrast was obtained utilizing 5 mm axial slices with T1, T2, T2 flair, SWI and diffusion weighted views.  T1 sagittal and T2 coronal views were obtained. CONTRAST: no IMAGING SITE: [facility] [address] (1.5 Tesla MRI)  FINDINGS: No abnormal lesions are seen on diffusion-weighted views to suggest acute ischemia. The cortical sulci, fissures and cisterns are normal in size and appearance. Lateral, third and fourth ventricle are normal in size and appearance. No extra-axial fluid collections are seen. No evidence of mass effect or midline shift.  On coronal views no mesial temporal sclerosis or hippocampal atrophy. On sagittal views the posterior fossa, pituitary gland and corpus callosum are unremarkable. No evidence of intracranial hemorrhage on SWI views. The orbits and their contents, paranasal sinuses and calvarium are unremarkable.  Intracranial flow voids are present.   06/01/2015  Normal MRI brain (without). No change from MRI on 04/20/08. INTERPRETING PHYSICIAN: Penni Bombard, MD Certified in Neurology, Neurophysiology and  Neuroimaging [facility] [address] [place], [place] [address] [phone]   US Transvaginal Non-ob  05/10/2015  CLINICAL DATA:  Patient diagnosed with a small ovarian cyst on 05/06/2015 now with heavy vaginal bleeding and left lower quadrant pain for 3 hours. No known injury. Subsequent encounter. EXAM: ULTRASOUND PELVIS TRANSVAGINAL TECHNIQUE: Transvaginal ultrasound examination of the pelvis was performed including evaluation of the uterus, ovaries, adnexal regions, and pelvic cul-de-sac. COMPARISON:  Pelvic ultrasound 05/06/2015. FINDINGS: Uterus Measurements: 7.3 x 3.6 x 4.1 cm. No fibroids or other mass visualized. Endometrium Thickness: 0.6 cm.  No focal abnormality visualized. Right ovary Measurements: 2.3 x 1.5 x 1.6 cm. Normal appearance/no adnexal mass. Hemorrhagic ovarian cyst seen on the prior study is difficult to appreciate on this exam. Left ovary Measurements: 2.0 x 1.7 x 2.2 cm. Normal appearance/no adnexal mass. Other findings:  No abnormal free fluid. IMPRESSION: Negative exam. Small hemorrhagic ovarian cyst seen on the prior study is difficult to appreciate today. Electronically Signed   By: Inge Rise M.D.   On: 05/10/2015 21:03     Imaging Review No results found. I have personally reviewed and evaluated these images and lab results as part of my medical decision-making.   EKG Interpretation None      MDM   Pt is a 25 y/o female, presents with multiple complaints.  I have personally evaluated the patient in the past, she is well known to this facility, with many visits in the past 6 months.  She has multiple complaints, and each time entering the room, she adds complaints.  She has rapid speech, is hyperactive and inattentive, very difficult to obtain  history from, even with mutliple re-directs, and with multiple times speaking with the patient in the room.  She has persistent headache, since closed head injury which occurred nearly 2  months ago.  My attending physician saw her nearly 2 weeks ago, with normal repeated head CT.  I have evaluated her in the past as well for same head ache.  She complains of 10/10 pain with photophobia, however is active, unable to hold still, moving around and speaking rapidly in fully lit ER exam room, appearing of the pt inconsistent with her complaints.  She has grossly normal neurological exam, CN 2-12 grossly normal, no droop, no slurring, EOM's and VF normal, no evident sensitivity to light when checking pupillary response.  She is able to ambulate normally, intact strength, sensation and coordination in all extremities.  Dr. Maryan Rued and I agree there is no emergent neurological concern, no indication for retesting, pt offered headache cocktail, encouraged IV fluids with meds, however she has refused multiple times the meds.  She has refused blood draws and prescribed tx, but continues to come back with same complaints, this behavior is well documented.  I discussed this with her, how we are trying to help her, but she refuses treatment that would be beneficial for her HA.  I also explained that she will be referred to outpatient neurology.  I hope she will benefit from this given her recent head injury, persistent headaches, compounded by hx of seizures and multiple psychiatric dx.    Pt allowed pelvic testing, STD testing, basic blood tests and UA.  HCG quantitative was ordered with pt's concerns of pregnancy.  Pelvic US was also ordered. Pelvic exam concerning for cervicitis.  Wet prep with +WBC's no trich, no clue cells.  i-stat HCG + (11.4), pt LMP is less than a month ago.  There is no IUP on [name], possible early pregnancy.  Pt was encouraged to follow up with PCP, go to OBGYN as needed.  [name] - "question small hemorrhagic cyst" right ovary, trace free pelvic fluid.  Finding of cyst on right side, although pain is greater on left, with trace pelvic fluid, LLQ pain may represent ruptured cyst.  Not  currently concerned with PID, due to pt's normal white count, pt is afebrile, no active vomiting observed.  Unclear if reported hx of vomiting is related to HA, possible early pregnancy, or other etiology, however pt's abdominal exam is unconcerning for acute abdomen, less concerning for intraabdominal infection which necessatates further emergent work up or treatment here today.  Pt is well appearing, other labs unremarkable. Will treat for STD's with meds approved for use in pregnancy, azithro and rocephin, will avoid flagyl.  Pt discharged with diclegis.  Has tolerated PO's.  She has ambulated w steady gait.  VVS.  Pt safe to discharge home.  Filed Vitals:   05/06/15 2321 05/07/15 0026  BP: 110/78 119/69  Pulse: 99 95  Temp:  98.5 F (36.9 C)  Resp: 16 18   Medications  azithromycin (ZITHROMAX) tablet 1,000 mg (1,000 mg Oral Given 05/06/15 2339)  cefTRIAXone (ROCEPHIN) injection 250 mg (250 mg Intramuscular Given 05/06/15 2340)  sterile water (preservative free) injection (10 mLs  Given 05/06/15 2340)   Meds refused:  IVF, reglan,benadryl, toradol, decadron  Final diagnoses:  Dysuria  Chronic intractable headache, unspecified headache type  Cervicitis        Delsa Grana, PA-C 06/09/15 1618  Blanchie Dessert, MD 06/09/15 1739

## 2015-06-14 ENCOUNTER — Ambulatory Visit (INDEPENDENT_AMBULATORY_CARE_PROVIDER_SITE_OTHER): Payer: Medicare Other | Admitting: Physician Assistant

## 2015-06-14 ENCOUNTER — Ambulatory Visit (INDEPENDENT_AMBULATORY_CARE_PROVIDER_SITE_OTHER): Payer: Medicare Other

## 2015-06-14 VITALS — BP 118/80 | HR 79 | Temp 98.0°F | Resp 18 | Ht 65.0 in | Wt 168.4 lb

## 2015-06-14 DIAGNOSIS — R0600 Dyspnea, unspecified: Secondary | ICD-10-CM

## 2015-06-14 DIAGNOSIS — J069 Acute upper respiratory infection, unspecified: Secondary | ICD-10-CM

## 2015-06-14 DIAGNOSIS — R1033 Periumbilical pain: Secondary | ICD-10-CM

## 2015-06-14 DIAGNOSIS — B9789 Other viral agents as the cause of diseases classified elsewhere: Secondary | ICD-10-CM

## 2015-06-14 DIAGNOSIS — R059 Cough, unspecified: Secondary | ICD-10-CM

## 2015-06-14 DIAGNOSIS — R05 Cough: Secondary | ICD-10-CM | POA: Diagnosis not present

## 2015-06-14 LAB — POCT CBC
GRANULOCYTE PERCENT: 63.8 % (ref 37–80)
HEMATOCRIT: 38.1 % (ref 37.7–47.9)
Hemoglobin: 13.1 g/dL (ref 12.2–16.2)
LYMPH, POC: 2.2 (ref 0.6–3.4)
MCH, POC: 28.7 pg (ref 27–31.2)
MCHC: 34.5 g/dL (ref 31.8–35.4)
MCV: 83.1 fL (ref 80–97)
MID (CBC): 0.5 (ref 0–0.9)
MPV: 8.7 fL (ref 0–99.8)
POC GRANULOCYTE: 4.8 (ref 2–6.9)
POC LYMPH %: 28.9 % (ref 10–50)
POC MID %: 7.3 %M (ref 0–12)
Platelet Count, POC: 183 10*3/uL (ref 142–424)
RBC: 4.58 M/uL (ref 4.04–5.48)
RDW, POC: 13.5 %
WBC: 7.5 10*3/uL (ref 4.6–10.2)

## 2015-06-14 LAB — POCT URINALYSIS DIP (MANUAL ENTRY)
BILIRUBIN UA: NEGATIVE
Blood, UA: NEGATIVE
Glucose, UA: NEGATIVE
LEUKOCYTES UA: NEGATIVE
NITRITE UA: NEGATIVE
PH UA: 6
PROTEIN UA: NEGATIVE
Spec Grav, UA: 1.015
Urobilinogen, UA: 0.2

## 2015-06-14 LAB — POCT URINE PREGNANCY: Preg Test, Ur: NEGATIVE

## 2015-06-14 MED ORDER — BENZONATATE 100 MG PO CAPS: 100.0000 mg | ORAL_CAPSULE | Freq: Three times a day (TID) | ORAL | Status: DC | PRN

## 2015-06-14 MED ORDER — GUAIFENESIN ER 1200 MG PO TB12: 1.0000 | ORAL_TABLET | Freq: Two times a day (BID) | ORAL | Status: DC | PRN

## 2015-06-14 NOTE — Patient Instructions (Addendum)
IF you received an x-ray today, you will receive an invoice from [facility]. Please contact [facility] at [phone] with questions or concerns regarding your invoice.   IF you received labwork today, you will receive an invoice from [employer]. Please contact [facility] at [phone] with questions or concerns regarding your invoice.   Our billing staff will not be able to assist you with questions regarding bills from these companies.  You will be contacted with the lab results as soon as they are available. The fastest way to get your results is to activate your My Chart account. Instructions are located on the last page of this paperwork. If you have not heard from [name] regarding the results in 2 weeks, please contact this office.   You should be hydrating with 64 oz or more water per day, which is almost 4 regular sized bottles. Warm baths for 4 times per day for 15 minutes.  You can use epsom salt. You can use over-the counter Delsym for the cough.  Let me know if you are having no improvement within the next 7 days. The bruising along your stomach appear very superficial.  Please let me know if these do not improve within the next 2 weeks. Upper Respiratory Infection, Adult Most upper respiratory infections (URIs) are a viral infection of the air passages leading to the lungs. A URI affects the nose, throat, and upper air passages. The most common type of URI is nasopharyngitis and is typically referred to as "the common cold." URIs run their course and usually go away on their own. Most of the time, a URI does not require medical attention, but sometimes a bacterial infection in the upper airways can follow a viral infection. This is called a secondary infection. Sinus and middle ear infections are common types of secondary upper respiratory infections. Bacterial pneumonia can also complicate a URI. A URI can worsen asthma and chronic  obstructive pulmonary disease (COPD). Sometimes, these complications can require emergency medical care and may be life threatening.  CAUSES Almost all URIs are caused by viruses. A virus is a type of germ and can spread from one person to another.  RISKS FACTORS You may be at risk for a URI if:   You smoke.   You have chronic heart or lung disease.  You have a weakened defense (immune) system.   You are very young or very old.   You have nasal allergies or asthma.  You work in crowded or poorly ventilated areas.  You work in health care facilities or schools. SIGNS AND SYMPTOMS  Symptoms typically develop 2-3 days after you come in contact with a cold virus. Most viral URIs last 7-10 days. However, viral URIs from the influenza virus (flu virus) can last 14-18 days and are typically more severe. Symptoms may include:   Runny or stuffy (congested) nose.   Sneezing.   Cough.   Sore throat.   Headache.   Fatigue.   Fever.   Loss of appetite.   Pain in your forehead, behind your eyes, and over your cheekbones (sinus pain).  Muscle aches.  DIAGNOSIS  Your health care provider may diagnose a URI by:  Physical exam.  Tests to check that your symptoms are not due to another condition such as:  Strep throat.  Sinusitis.  Pneumonia.  Asthma. TREATMENT  A URI goes away on its own with time. It cannot be cured with medicines, but medicines may be prescribed  or recommended to relieve symptoms. Medicines may help:  Reduce your fever.  Reduce your cough.  Relieve nasal congestion. HOME CARE INSTRUCTIONS   Take medicines only as directed by your health care provider.   Gargle warm saltwater or take cough drops to comfort your throat as directed by your health care provider.  Use a warm mist humidifier or inhale steam from a shower to increase air moisture. This may make it easier to breathe.  Drink enough fluid to keep your urine clear or pale  yellow.   Eat soups and other clear broths and maintain good nutrition.   Rest as needed.   Return to work when your temperature has returned to normal or as your health care provider advises. You may need to stay home longer to avoid infecting others. You can also use a face mask and careful hand washing to prevent spread of the virus.  Increase the usage of your inhaler if you have asthma.   Do not use any tobacco products, including cigarettes, chewing tobacco, or electronic cigarettes. If you need help quitting, ask your health care provider. PREVENTION  The best way to protect yourself from getting a cold is to practice good hygiene.   Avoid oral or hand contact with people with cold symptoms.   Wash your hands often if contact occurs.  There is no clear evidence that vitamin C, vitamin E, echinacea, or exercise reduces the chance of developing a cold. However, it is always recommended to get plenty of rest, exercise, and practice good nutrition.  SEEK MEDICAL CARE IF:   You are getting worse rather than better.   Your symptoms are not controlled by medicine.   You have chills.  You have worsening shortness of breath.  You have brown or red mucus.  You have yellow or brown nasal discharge.  You have pain in your face, especially when you bend forward.  You have a fever.  You have swollen neck glands.  You have pain while swallowing.  You have white areas in the back of your throat. SEEK IMMEDIATE MEDICAL CARE IF:   You have severe or persistent:  Headache.  Ear pain.  Sinus pain.  Chest pain.  You have chronic lung disease and any of the following:  Wheezing.  Prolonged cough.  Coughing up blood.  A change in your usual mucus.  You have a stiff neck.  You have changes in your:  Vision.  Hearing.  Thinking.  Mood. MAKE SURE YOU:   Understand these instructions.  Will watch your condition.  Will get help right away if you are  not doing well or get worse.   This information is not intended to replace advice given to you by your health care provider. Make sure you discuss any questions you have with your health care provider.   Document Released: 07/18/2000 Document Revised: 06/08/2014 Document Reviewed: 04/29/2013 Elsevier Interactive Patient Education [employer].

## 2015-06-14 NOTE — Progress Notes (Signed)
[facility] [address] [place] [address] [phone]  Date:  06/14/2015   Name:  [name]   DOB:  1990/10/26   MRN:  [id number]  PCP:  No primary care provider on file.   History of Present Illness:  [name] is a 25 y.o. female patient who presents to [facility] for cc of cough, bruise along stomach, and bump along the pubic area.  Coughing: for 3 days, runny nose and stuffy nose.  Coughing up yellow green mucus, intermittently throughout the day.  Her chest hurts.  She has sore throat that has resolved.  Minimal nasal congestion.  She has taken alka-seltzer to no avail.  She has had to use inhaler intermittently.  She has no sob at this time.  She has had no fever..    Bruises on her belly: she has had soreness along her belly with some bruising.  They are tender.  She has no other surrounding abdominal pain.  Some bruising along her arm as well.  No hematuria, or dysuria.  No nausea, diarrhea, or constipation.  These have been present for about 4 days.  She recalls running into her wall.  She states it was because it was darkly lit.       Bump on her groin:  4 weeks of a small bump at her vagina that is painful.  It has not changed in size.  She has no drainage to the area.  She has not done anything for relief.  No thermotherapy use.      Patient Active Problem List   Diagnosis Date Noted  . Chronic migraine 05/19/2015  . Asthma 04/15/2015  . Convulsion ([place]) 04/12/2015  . Fall 04/12/2015  . Suicidal ideation   . Homicidal ideation   . Suicidal ideations 06/23/2014  . Prolonged QT syndrome   . EKG abnormality 03/12/2014  . Schizoaffective disorder, bipolar type ([place]) 03/10/2014  . Borderline personality disorder 03/10/2014  . PTSD (post-traumatic stress disorder) 03/10/2014  . Asperger's syndrome 03/10/2014    Past Medical History  Diagnosis Date  . Asthma   . Depression   . Schizoaffective disorder, bipolar type ([place])   . Autism   .  Essential tremor   . Chronic constipation   . Acid reflux   . Drug-seeking behavior   . Seizures [facility])     Past Surgical History  Procedure Laterality Date  . Mouth surgery      Social History  Substance Use Topics  . Smoking status: Never Smoker   . Smokeless tobacco: None  . Alcohol Use: No    Family History  Problem Relation Age of Onset  . Mental illness Father   . Asthma Father   . PDD Brother   . Seizures Brother     Allergies  Allergen Reactions  . Bee Venom Anaphylaxis  . Coconut Flavor Anaphylaxis  . Geodon [Ziprasidone Hcl] Other (See Comments)    Paralysis of mouth  . Haloperidol And Related Other (See Comments)    Paralysis of mouth   . Lithium Other (See Comments)    Seizure like muscle spasms   . Oxycodone Other (See Comments)    hallucinations   . Seroquel [Quetiapine Fumarate] Other (See Comments)    TOO STRONG  . Prilosec [Omeprazole] Nausea And Vomiting  . Milk-Related Compounds Nausea And Vomiting    Patient can eat dairy but can not drink "straight milk".  Richardo Hanks [Oxycodone-Acetaminophen] Other (See Comments)  Causes hallucinations.  . Shellfish Allergy Swelling  . Tegretol [Carbamazepine] Nausea And Vomiting    Medication list has been reviewed and updated.  Current Outpatient Prescriptions on File Prior to Visit  Medication Sig Dispense Refill  . acetaminophen (TYLENOL) 500 MG tablet Take 1,000 mg by mouth every 6 (six) hours as needed for moderate pain.    Marland Kitchen albuterol (PROVENTIL HFA;VENTOLIN HFA) 108 (90 Base) MCG/ACT inhaler Inhale 1-2 puffs into the lungs every 6 (six) hours as needed for wheezing or shortness of breath. Reported on 05/05/2015    . ARIPiprazole (ABILIFY) 15 MG tablet Take 30 mg by mouth daily.     . cholecalciferol (VITAMIN D-400) 400 UNITS TABS tablet Take 800 Units by mouth daily.    . divalproex (DEPAKOTE ER) 500 MG 24 hr tablet Take 2 tablets (1,000 mg total) by mouth daily. 60 tablet 5  . fluticasone  (FLOVENT HFA) 220 MCG/ACT inhaler Inhale into the lungs 2 (two) times daily.    Marland Kitchen ibuprofen (ADVIL,MOTRIN) 800 MG tablet Take 1 tablet (800 mg total) by mouth 3 (three) times daily. 21 tablet 0  . prazosin (MINIPRESS) 1 MG capsule Take 1 capsule (1 mg total) by mouth at bedtime. (Patient taking differently: Take 2 mg by mouth at bedtime. )    . Prenatal Vit-Fe Fumarate-FA (PRENATAL MULTIVITAMIN) TABS tablet Take 1 tablet by mouth at bedtime.    . senna (SENOKOT) 8.6 MG TABS tablet Take 2 tablets by mouth at bedtime.    . Biotin (BIOTIN MAXIMUM STRENGTH) 10 MG TABS Take 1 tablet by mouth at bedtime. Reported on 06/14/2015    . docusate sodium (COLACE) 100 MG capsule Take 1 capsule (100 mg total) by mouth at bedtime. (Patient not taking: Reported on 06/14/2015) 10 capsule 0  . meloxicam (MOBIC) 15 MG tablet Take 1 tablet (15 mg total) by mouth daily. (Patient not taking: Reported on 06/14/2015) 30 tablet 2   No current facility-administered medications on file prior to visit.    ROS ROS otherwise unremarkable unless listed above.   Physical Examination: BP 118/80 mmHg  Pulse 79  Temp(Src) 98 F (36.7 C) (Oral)  Resp 18  Ht 5\' 5"  (1.651 m)  Wt 168 lb 6.4 oz (76.386 kg)  BMI 28.02 kg/m2  SpO2 100%  LMP 06/07/2015 Ideal Body Weight: Weight in (lb) to have BMI = 25: 149.9  Physical Exam  Constitutional: She is oriented to person, place, and time. She appears well-developed and well-nourished. No distress.  HENT:  Head: Normocephalic and atraumatic.  Right Ear: Tympanic membrane, external ear and ear canal normal.  Left Ear: Tympanic membrane, external ear and ear canal normal.  Nose: Mucosal edema and rhinorrhea present. Right sinus exhibits no maxillary sinus tenderness and no frontal sinus tenderness. Left sinus exhibits no maxillary sinus tenderness and no frontal sinus tenderness.  Mouth/Throat: No uvula swelling. No oropharyngeal exudate, posterior oropharyngeal edema or posterior  oropharyngeal erythema.  Eyes: Conjunctivae and EOM are normal. Pupils are equal, round, and reactive to light.  Cardiovascular: Normal rate and regular rhythm.  Exam reveals no gallop, no distant heart sounds and no friction rub.   No murmur heard. Pulmonary/Chest: Effort normal. No respiratory distress. She has no decreased breath sounds. She has no wheezes. She has no rhonchi.  Abdominal: Normal appearance and bowel sounds are normal. There is no hepatosplenomegaly. There is no tenderness. There is no tenderness at McBurney's point and negative Murphy's sign.  Ecchymotic circular lesions just right superior of the umbilicus.  They are tender superficially.  No mass or nodularity appreciated.    Genitourinary:     Fluctuant small swelling just lateral to clitoris along the labia minora.  It is tender.  There are small pustules without expression at this site.    Lymphadenopathy:       Head (right side): No submandibular, no tonsillar, no preauricular and no posterior auricular adenopathy present.       Head (left side): No submandibular, no tonsillar, no preauricular and no posterior auricular adenopathy present.  Neurological: She is alert and oriented to person, place, and time.  Skin: Skin is warm and dry. She is not diaphoretic.  Psychiatric: She has a normal mood and affect. Her behavior is normal.     Assessment and Plan: [name] is a 25 y.o. female who is here today for bump at her vaginal area, bruising along her belly, and cough. This cough appears viral at this time.  I have advised treating this supportively and heavy hydration. Abdominal contusion, appears from trauma--I have advised that she return in 2 weeks if they have not resolved. Small opening allowed for appropriate minimal purulent expressoion.  Advised warm sitz bath.     Periumbilical abdominal pain - Plan: POCT CBC, POCT urine pregnancy, POCT urinalysis dipstick  Cough - Plan: POCT CBC, DG Chest 2  View  Dyspnea - Plan: POCT CBC, DG Chest 2 View, Guaifenesin (MUCINEX MAXIMUM STRENGTH) 1200 MG TB12  Viral URI with cough - Plan: Guaifenesin (MUCINEX MAXIMUM STRENGTH) 1200 MG TB12, benzonatate (TESSALON) 100 MG capsule  Ivar Drape, PA-C [facility] 06/14/2015 8:34 AM

## 2015-06-20 ENCOUNTER — Other Ambulatory Visit: Payer: Medicare Other

## 2015-06-23 ENCOUNTER — Telehealth: Payer: Self-pay | Admitting: *Deleted

## 2015-06-23 NOTE — Telephone Encounter (Signed)
Lynnmarie Razzano Taber               CID [id number]  Patient SAME                 Pt's Dr Felecia Shelling        Area Code 336 Phone# [phone]     RE NEEDS TO RESCHEDULE APPT FOR AFTER 3:30/ALSO      PCB AFTER 3:30                                       Disp:Y/N N If Y = C/B If No Response In 18minutes ============================================================ LT v/m for pt to call to r/s.

## 2015-07-05 ENCOUNTER — Telehealth: Payer: Self-pay | Admitting: Neurology

## 2015-07-05 NOTE — Telephone Encounter (Signed)
Message For: [facility]                  Taken 30-MAY-17 at  2:04PM by [facility] ------------------------------------------------------------  Wilmon Pali               CID  [id number]   Patient  SAME                  Pt's Dr  Felecia Shelling         Area Code  336  Phone#  [employer]  [birth year]      RE  APPT REQ//PCB                                                                                           Disp:Y/N  N  If Y = C/B If No Response In 44minutes  ============================================================  I called pt back, she is going to keep all appts the same.

## 2015-07-10 ENCOUNTER — Encounter (HOSPITAL_COMMUNITY): Payer: Self-pay | Admitting: Emergency Medicine

## 2015-07-10 ENCOUNTER — Inpatient Hospital Stay (HOSPITAL_COMMUNITY)
Admission: EM | Admit: 2015-07-10 | Discharge: 2015-07-15 | DRG: 918 | Disposition: A | Discharge status: Other Acute Inpatient Hospital | Payer: Medicare Other | Attending: Internal Medicine | Admitting: Internal Medicine

## 2015-07-10 ENCOUNTER — Emergency Department (HOSPITAL_COMMUNITY): Payer: Medicare Other

## 2015-07-10 DIAGNOSIS — E872 Acidosis: Secondary | ICD-10-CM | POA: Diagnosis present

## 2015-07-10 DIAGNOSIS — F4310 Post-traumatic stress disorder, unspecified: Secondary | ICD-10-CM | POA: Diagnosis present

## 2015-07-10 DIAGNOSIS — Z79899 Other long term (current) drug therapy: Secondary | ICD-10-CM | POA: Diagnosis not present

## 2015-07-10 DIAGNOSIS — F17210 Nicotine dependence, cigarettes, uncomplicated: Secondary | ICD-10-CM | POA: Diagnosis present

## 2015-07-10 DIAGNOSIS — Z8669 Personal history of other diseases of the nervous system and sense organs: Secondary | ICD-10-CM | POA: Diagnosis not present

## 2015-07-10 DIAGNOSIS — Z3A01 Less than 8 weeks gestation of pregnancy: Secondary | ICD-10-CM | POA: Diagnosis not present

## 2015-07-10 DIAGNOSIS — Z765 Malingerer [conscious simulation]: Secondary | ICD-10-CM | POA: Diagnosis not present

## 2015-07-10 DIAGNOSIS — G40909 Epilepsy, unspecified, not intractable, without status epilepticus: Secondary | ICD-10-CM | POA: Diagnosis present

## 2015-07-10 DIAGNOSIS — R339 Retention of urine, unspecified: Secondary | ICD-10-CM

## 2015-07-10 DIAGNOSIS — T443X2A Poisoning by other parasympatholytics [anticholinergics and antimuscarinics] and spasmolytics, intentional self-harm, initial encounter: Secondary | ICD-10-CM

## 2015-07-10 DIAGNOSIS — Z885 Allergy status to narcotic agent status: Secondary | ICD-10-CM | POA: Diagnosis not present

## 2015-07-10 DIAGNOSIS — Z915 Personal history of self-harm: Secondary | ICD-10-CM | POA: Diagnosis not present

## 2015-07-10 DIAGNOSIS — F250 Schizoaffective disorder, bipolar type: Secondary | ICD-10-CM | POA: Diagnosis not present

## 2015-07-10 DIAGNOSIS — Z888 Allergy status to other drugs, medicaments and biological substances status: Secondary | ICD-10-CM

## 2015-07-10 DIAGNOSIS — Z91013 Allergy to seafood: Secondary | ICD-10-CM | POA: Diagnosis not present

## 2015-07-10 DIAGNOSIS — I4581 Long QT syndrome: Secondary | ICD-10-CM

## 2015-07-10 DIAGNOSIS — Z331 Pregnant state, incidental: Secondary | ICD-10-CM | POA: Diagnosis present

## 2015-07-10 DIAGNOSIS — F840 Autistic disorder: Secondary | ICD-10-CM | POA: Diagnosis present

## 2015-07-10 DIAGNOSIS — O26899 Other specified pregnancy related conditions, unspecified trimester: Secondary | ICD-10-CM

## 2015-07-10 DIAGNOSIS — K5909 Other constipation: Secondary | ICD-10-CM | POA: Diagnosis present

## 2015-07-10 DIAGNOSIS — Z91030 Bee allergy status: Secondary | ICD-10-CM

## 2015-07-10 DIAGNOSIS — Z818 Family history of other mental and behavioral disorders: Secondary | ICD-10-CM

## 2015-07-10 DIAGNOSIS — Z9889 Other specified postprocedural states: Secondary | ICD-10-CM | POA: Diagnosis not present

## 2015-07-10 DIAGNOSIS — T1491 Suicide attempt: Secondary | ICD-10-CM | POA: Diagnosis not present

## 2015-07-10 DIAGNOSIS — K219 Gastro-esophageal reflux disease without esophagitis: Secondary | ICD-10-CM | POA: Diagnosis present

## 2015-07-10 DIAGNOSIS — F845 Asperger's syndrome: Secondary | ICD-10-CM | POA: Diagnosis present

## 2015-07-10 DIAGNOSIS — G250 Essential tremor: Secondary | ICD-10-CM | POA: Diagnosis present

## 2015-07-10 DIAGNOSIS — K5900 Constipation, unspecified: Secondary | ICD-10-CM | POA: Diagnosis present

## 2015-07-10 DIAGNOSIS — T443X1A Poisoning by other parasympatholytics [anticholinergics and antimuscarinics] and spasmolytics, accidental (unintentional), initial encounter: Secondary | ICD-10-CM | POA: Diagnosis present

## 2015-07-10 DIAGNOSIS — Z9102 Food additives allergy status: Secondary | ICD-10-CM | POA: Diagnosis not present

## 2015-07-10 DIAGNOSIS — E876 Hypokalemia: Secondary | ICD-10-CM | POA: Diagnosis present

## 2015-07-10 DIAGNOSIS — T450X4A Poisoning by antiallergic and antiemetic drugs, undetermined, initial encounter: Secondary | ICD-10-CM

## 2015-07-10 DIAGNOSIS — Z62810 Personal history of physical and sexual abuse in childhood: Secondary | ICD-10-CM | POA: Diagnosis present

## 2015-07-10 DIAGNOSIS — O0000 Abdominal pregnancy without intrauterine pregnancy: Secondary | ICD-10-CM | POA: Diagnosis not present

## 2015-07-10 DIAGNOSIS — F603 Borderline personality disorder: Secondary | ICD-10-CM | POA: Diagnosis present

## 2015-07-10 DIAGNOSIS — Z91011 Allergy to milk products: Secondary | ICD-10-CM

## 2015-07-10 DIAGNOSIS — Z825 Family history of asthma and other chronic lower respiratory diseases: Secondary | ICD-10-CM | POA: Diagnosis not present

## 2015-07-10 DIAGNOSIS — R45851 Suicidal ideations: Secondary | ICD-10-CM | POA: Diagnosis present

## 2015-07-10 DIAGNOSIS — E559 Vitamin D deficiency, unspecified: Secondary | ICD-10-CM | POA: Diagnosis present

## 2015-07-10 DIAGNOSIS — T450X4S Poisoning by antiallergic and antiemetic drugs, undetermined, sequela: Secondary | ICD-10-CM | POA: Diagnosis not present

## 2015-07-10 DIAGNOSIS — R102 Pelvic and perineal pain: Secondary | ICD-10-CM

## 2015-07-10 DIAGNOSIS — J45909 Unspecified asthma, uncomplicated: Secondary | ICD-10-CM | POA: Diagnosis present

## 2015-07-10 DIAGNOSIS — T450X2A Poisoning by antiallergic and antiemetic drugs, intentional self-harm, initial encounter: Principal | ICD-10-CM | POA: Diagnosis present

## 2015-07-10 DIAGNOSIS — Z3490 Encounter for supervision of normal pregnancy, unspecified, unspecified trimester: Secondary | ICD-10-CM

## 2015-07-10 LAB — BASIC METABOLIC PANEL
ANION GAP: 10 (ref 5–15)
ANION GAP: 7 (ref 5–15)
BUN: 5 mg/dL — AB (ref 6–20)
CALCIUM: 7.7 mg/dL — AB (ref 8.9–10.3)
CALCIUM: 7.8 mg/dL — AB (ref 8.9–10.3)
CO2: 12 mmol/L — ABNORMAL LOW (ref 22–32)
CO2: 17 mmol/L — ABNORMAL LOW (ref 22–32)
CREATININE: 0.8 mg/dL (ref 0.44–1.00)
CREATININE: 0.65 mg/dL (ref 0.44–1.00)
Chloride: 114 mmol/L — ABNORMAL HIGH (ref 101–111)
Chloride: 113 mmol/L — ABNORMAL HIGH (ref 101–111)
GFR calc Af Amer: >60 mL/min (ref >60)
GFR calc Af Amer: >60 mL/min (ref >60)
GLUCOSE: 90 mg/dL (ref 65–99)
GLUCOSE: 77 mg/dL (ref 65–99)
Potassium: 3.9 mmol/L (ref 3.5–5.1)
Potassium: 3.8 mmol/L (ref 3.5–5.1)
Sodium: 136 mmol/L (ref 135–145)
Sodium: 137 mmol/L (ref 135–145)

## 2015-07-10 LAB — URINALYSIS, ROUTINE W REFLEX MICROSCOPIC
Bilirubin Urine: NEGATIVE
Glucose, UA: NEGATIVE mg/dL
Hgb urine dipstick: NEGATIVE
KETONES UR: 15 mg/dL — AB
LEUKOCYTES UA: NEGATIVE
NITRITE: NEGATIVE
PH: 5.5 (ref 5.0–8.0)
Protein, ur: NEGATIVE mg/dL
SPECIFIC GRAVITY, URINE: 1.025 (ref 1.005–1.030)

## 2015-07-10 LAB — COMPREHENSIVE METABOLIC PANEL
ALBUMIN: 3.5 g/dL (ref 3.5–5.0)
ALT: 15 U/L (ref 14–54)
ANION GAP: 9 (ref 5–15)
AST: 20 U/L (ref 15–41)
Alkaline Phosphatase: 47 U/L (ref 38–126)
BILIRUBIN TOTAL: 0.4 mg/dL (ref 0.3–1.2)
BUN: 8 mg/dL (ref 6–20)
CO2: 19 mmol/L — ABNORMAL LOW (ref 22–32)
Calcium: 8.8 mg/dL — ABNORMAL LOW (ref 8.9–10.3)
Chloride: 109 mmol/L (ref 101–111)
Creatinine, Ser: 0.8 mg/dL (ref 0.44–1.00)
Glucose, Bld: 97 mg/dL (ref 65–99)
POTASSIUM: 2.6 mmol/L — AB (ref 3.5–5.1)
Sodium: 137 mmol/L (ref 135–145)
TOTAL PROTEIN: 6.1 g/dL — AB (ref 6.5–8.1)

## 2015-07-10 LAB — CBC WITH DIFFERENTIAL/PLATELET
BASOS PCT: 0 %
Basophils Absolute: 0 10*3/uL (ref 0.0–0.1)
Eosinophils Absolute: 0 10*3/uL (ref 0.0–0.7)
Eosinophils Relative: 0 %
HEMATOCRIT: 38.9 % (ref 36.0–46.0)
Hemoglobin: 12.5 g/dL (ref 12.0–15.0)
Lymphocytes Relative: 35 %
Lymphs Abs: 3.6 10*3/uL (ref 0.7–4.0)
MCH: 27.8 pg (ref 26.0–34.0)
MCHC: 32.1 g/dL (ref 30.0–36.0)
MCV: 86.6 fL (ref 78.0–100.0)
MONO ABS: 0.7 10*3/uL (ref 0.1–1.0)
MONOS PCT: 7 %
NEUTROS ABS: 6 10*3/uL (ref 1.7–7.7)
NEUTROS PCT: 58 %
Platelets: 202 10*3/uL (ref 150–400)
RBC: 4.49 MIL/uL (ref 3.87–5.11)
RDW: 13.1 % (ref 11.5–15.5)
WBC: 10.3 10*3/uL (ref 4.0–10.5)

## 2015-07-10 LAB — RAPID URINE DRUG SCREEN, HOSP PERFORMED
Amphetamines: NOT DETECTED
BENZODIAZEPINES: POSITIVE — AB
Barbiturates: NOT DETECTED
COCAINE: NOT DETECTED
Opiates: NOT DETECTED
Tetrahydrocannabinol: NOT DETECTED

## 2015-07-10 LAB — ETHANOL

## 2015-07-10 LAB — MAGNESIUM: MAGNESIUM: 1.8 mg/dL (ref 1.7–2.4)

## 2015-07-10 LAB — MRSA PCR SCREENING: MRSA by PCR: NEGATIVE

## 2015-07-10 LAB — ACETAMINOPHEN LEVEL: ACETAMINOPHEN (TYLENOL), SERUM: 15 ug/mL (ref 10–30)

## 2015-07-10 LAB — HCG, QUANTITATIVE, PREGNANCY: HCG, BETA CHAIN, QUANT, S: 7423 m[IU]/mL — AB (ref <5)

## 2015-07-10 LAB — I-STAT BETA HCG BLOOD, ED (MC, WL, AP ONLY)

## 2015-07-10 LAB — GLUCOSE, CAPILLARY: GLUCOSE-CAPILLARY: 104 mg/dL — AB (ref 65–99)

## 2015-07-10 LAB — SALICYLATE LEVEL

## 2015-07-10 LAB — CK: CK TOTAL: 200 U/L (ref 38–234)

## 2015-07-10 LAB — VALPROIC ACID LEVEL: VALPROIC ACID LVL: 48 ug/mL — AB (ref 50.0–100.0)

## 2015-07-10 MED ORDER — LORAZEPAM 2 MG/ML IJ SOLN
1.0000 mg | Freq: Once | INTRAMUSCULAR | Status: AC
  Administered 2015-07-10: 1 mg via INTRAVENOUS

## 2015-07-10 MED ORDER — CHOLECALCIFEROL 10 MCG (400 UNIT) PO TABS
800.0000 [IU] | ORAL_TABLET | Freq: Every day | ORAL | Status: DC
  Administered 2015-07-10 – 2015-07-15 (×5): 800 [IU] via ORAL
  Filled 2015-07-10 (×6): qty 2

## 2015-07-10 MED ORDER — WHITE PETROLATUM GEL
Status: AC
  Administered 2015-07-10: 0.2 via ORAL
  Filled 2015-07-10: qty 1

## 2015-07-10 MED ORDER — ACETAMINOPHEN 650 MG RE SUPP
650.0000 mg | Freq: Four times a day (QID) | RECTAL | Status: DC | PRN
Start: 2015-07-10 — End: 2015-07-15

## 2015-07-10 MED ORDER — LORAZEPAM 2 MG/ML IJ SOLN: 0.5000 mg | INTRAMUSCULAR | Status: DC | PRN

## 2015-07-10 MED ORDER — ACETAMINOPHEN 325 MG PO TABS
650.0000 mg | ORAL_TABLET | Freq: Four times a day (QID) | ORAL | Status: DC | PRN
  Administered 2015-07-11 – 2015-07-15 (×4): 650 mg via ORAL
  Filled 2015-07-10 (×4): qty 2

## 2015-07-10 MED ORDER — SODIUM CHLORIDE 0.9% FLUSH
3.0000 mL | Freq: Two times a day (BID) | INTRAVENOUS | Status: DC
  Administered 2015-07-10 – 2015-07-15 (×10): 3 mL via INTRAVENOUS

## 2015-07-10 MED ORDER — SODIUM CHLORIDE 0.9 % IV BOLUS (SEPSIS)
1000.0000 mL | Freq: Once | INTRAVENOUS | Status: AC
  Administered 2015-07-10: 1000 mL via INTRAVENOUS

## 2015-07-10 MED ORDER — SODIUM CHLORIDE 0.9 % IV SOLN
500.0000 mg | Freq: Two times a day (BID) | INTRAVENOUS | Status: DC
  Administered 2015-07-10 – 2015-07-11 (×3): 500 mg via INTRAVENOUS
  Filled 2015-07-10 (×4): qty 5

## 2015-07-10 MED ORDER — KCL IN DEXTROSE-NACL 40-5-0.45 MEQ/L-%-% IV SOLN
INTRAVENOUS | Status: DC
  Administered 2015-07-10: 1000 mL via INTRAVENOUS
  Administered 2015-07-11 (×2): via INTRAVENOUS
  Filled 2015-07-10 (×8): qty 1000

## 2015-07-10 MED ORDER — POTASSIUM CHLORIDE 10 MEQ/100ML IV SOLN
10.0000 meq | INTRAVENOUS | Status: AC
  Administered 2015-07-10 (×2): 10 meq via INTRAVENOUS
  Filled 2015-07-10 (×2): qty 100

## 2015-07-10 MED ORDER — PRENATAL MULTIVITAMIN CH
1.0000 | ORAL_TABLET | Freq: Every day | ORAL | Status: DC
  Administered 2015-07-10 – 2015-07-14 (×5): 1 via ORAL
  Filled 2015-07-10 (×7): qty 1

## 2015-07-10 MED ORDER — POTASSIUM CHLORIDE IN NACL 40-0.9 MEQ/L-% IV SOLN
INTRAVENOUS | Status: DC
  Administered 2015-07-10: 125 mL/h via INTRAVENOUS
  Filled 2015-07-10 (×2): qty 1000

## 2015-07-10 MED ORDER — VALPROATE SODIUM 500 MG/5ML IV SOLN
500.0000 mg | Freq: Once | INTRAVENOUS | Status: DC
  Filled 2015-07-10: qty 5

## 2015-07-10 MED ORDER — POTASSIUM CHLORIDE CRYS ER 20 MEQ PO TBCR
40.0000 meq | EXTENDED_RELEASE_TABLET | Freq: Once | ORAL | Status: DC
  Filled 2015-07-10: qty 2

## 2015-07-10 MED ORDER — MENTHOL 3 MG MT LOZG
1.0000 | LOZENGE | OROMUCOSAL | Status: DC | PRN
  Administered 2015-07-11 – 2015-07-15 (×4): 3 mg via ORAL
  Filled 2015-07-10 (×4): qty 9

## 2015-07-10 MED ORDER — LORAZEPAM 2 MG/ML IJ SOLN
1.0000 mg | Freq: Once | INTRAMUSCULAR | Status: AC
  Administered 2015-07-10: 1 mg via INTRAVENOUS
  Filled 2015-07-10: qty 1

## 2015-07-10 MED ORDER — PHENOL 1.4 % MT LIQD
1.0000 | OROMUCOSAL | Status: DC | PRN
Start: 2015-07-10 — End: 2015-07-15
  Administered 2015-07-10 – 2015-07-15 (×3): 1 via OROMUCOSAL
  Filled 2015-07-10 (×2): qty 177

## 2015-07-10 MED ORDER — LORAZEPAM 2 MG/ML IJ SOLN: 1.0000 mg | INTRAMUSCULAR | Status: DC | PRN

## 2015-07-10 MED ORDER — LORAZEPAM 2 MG/ML IJ SOLN
1.0000 mg | Freq: Once | INTRAMUSCULAR | Status: DC
  Filled 2015-07-10: qty 1

## 2015-07-10 MED ORDER — LORAZEPAM 2 MG/ML IJ SOLN
INTRAMUSCULAR | Status: AC
  Filled 2015-07-10: qty 1

## 2015-07-10 NOTE — ED Provider Notes (Signed)
CSN: [id number]     Arrival date & time 07/10/15  0151 History   By signing my name below, I, [name], attest that this documentation has been prepared under the direction and in the presence of Evelyn Arthurs, MD . Electronically Signed: Rowan Ward, Scribe. 07/10/2015. 2:14 AM.   Chief Complaint  Patient presents with  . Drug Overdose   The history is provided by the patient. No language interpreter was used.   HPI Comments:  [name] is a 25 y.o. female with PMHx of depression, autism, essential tremor, and seizures who presents to the Emergency Department via EMS s/p drug overdose. Pt took 60 50mg  tablets of diphenhydramine ~0100 today. Pt denies ETOH use or use of any other drugs tonight. She states she was stressed about her job and feeling depressed. Pt is currently taking Depakote for seizures; she denies overdose of Depakote tonight. Her LMP was 5/2 and she was late for her period. Denies abdominal pain.   Past Medical History  Diagnosis Date  . Asthma   . Depression   . Schizoaffective disorder, bipolar type ([place])   . Autism   . Essential tremor   . Chronic constipation   . Acid reflux   . Drug-seeking behavior   . Seizures [facility])    Past Surgical History  Procedure Laterality Date  . Mouth surgery     Family History  Problem Relation Age of Onset  . Mental illness Father   . Asthma Father   . PDD Brother   . Seizures Brother    Social History  Substance Use Topics  . Smoking status: Never Smoker   . Smokeless tobacco: None  . Alcohol Use: No   OB History    Gravida Para Term Preterm AB TAB SAB Ectopic Multiple Living   1    1  1         Review of Systems  Psychiatric/Behavioral: Positive for suicidal ideas, self-injury and dysphoric mood. The patient is nervous/anxious.   All other systems reviewed and are negative.    Allergies  Bee venom; Coconut flavor; Geodon; Haloperidol and related; Lithium; Oxycodone; Seroquel; Prilosec; Milk-related  compounds; Percocet; Shellfish allergy; and Tegretol  Home Medications   Prior to Admission medications   Medication Sig Start Date End Date Taking? Authorizing Provider  diphenhydrAMINE (BENADRYL) 25 MG tablet Take 25 mg by mouth once.   Yes Historical Provider, MD  acetaminophen (TYLENOL) 500 MG tablet Take 1,000 mg by mouth every 6 (six) hours as needed for moderate pain.    Historical Provider, MD  albuterol (PROVENTIL HFA;VENTOLIN HFA) 108 (90 Base) MCG/ACT inhaler Inhale 1-2 puffs into the lungs every 6 (six) hours as needed for wheezing or shortness of breath. Reported on 05/05/2015    Historical Provider, MD  ARIPiprazole (ABILIFY) 15 MG tablet Take 30 mg by mouth daily.     Historical Provider, MD  benzonatate (TESSALON) 100 MG capsule Take 1-2 capsules (100-200 mg total) by mouth 3 (three) times daily as needed for cough. 06/14/15   Evelyn Heckle English, PA  Biotin (BIOTIN MAXIMUM STRENGTH) 10 MG TABS Take 1 tablet by mouth at bedtime. Reported on 06/14/2015    Historical Provider, MD  cholecalciferol (VITAMIN D-400) 400 UNITS TABS tablet Take 800 Units by mouth daily.    Historical Provider, MD  divalproex (DEPAKOTE ER) 500 MG 24 hr tablet Take 2 tablets (1,000 mg total) by mouth daily. 05/19/15   Evelyn Bottom, MD  docusate sodium (COLACE) 100  MG capsule Take 1 capsule (100 mg total) by mouth at bedtime. Patient not taking: Reported on 06/14/2015 03/29/14   Evelyn Hummer, NP  doxylamine, Sleep, (UNISOM) 25 MG tablet Take 25 mg by mouth at bedtime as needed.    Historical Provider, MD  fluticasone (FLOVENT HFA) 220 MCG/ACT inhaler Inhale into the lungs 2 (two) times daily.    Historical Provider, MD  Guaifenesin (MUCINEX MAXIMUM STRENGTH) 1200 MG TB12 Take 1 tablet (1,200 mg total) by mouth every 12 (twelve) hours as needed. 06/14/15   Evelyn Heckle English, PA  ibuprofen (ADVIL,MOTRIN) 800 MG tablet Take 1 tablet (800 mg total) by mouth 3 (three) times daily. 05/06/15   Evelyn Grana, PA-C   meloxicam (MOBIC) 15 MG tablet Take 1 tablet (15 mg total) by mouth daily. Patient not taking: Reported on 06/14/2015 04/12/15   Evelyn Rod, DO  prazosin (MINIPRESS) 1 MG capsule Take 1 capsule (1 mg total) by mouth at bedtime. Patient taking differently: Take 2 mg by mouth at bedtime.  03/29/14   Evelyn Hummer, NP  Prenatal Vit-Fe Fumarate-FA (PRENATAL MULTIVITAMIN) TABS tablet Take 1 tablet by mouth at bedtime.    Historical Provider, MD  senna (SENOKOT) 8.6 MG TABS tablet Take 2 tablets by mouth at bedtime.    Historical Provider, MD   BP 117/67 mmHg  Pulse 121  Temp(Src) 98.4 F (36.9 C)  Resp 22  SpO2 100%  LMP 06/07/2015   Physical Exam  Constitutional: She appears well-developed and well-nourished. No distress.  HENT:  Head: Normocephalic and atraumatic.  Mouth/Throat: Oropharynx is clear and moist. No oropharyngeal exudate.  Eyes: Conjunctivae and EOM are normal. Right eye exhibits no discharge. Left eye exhibits no discharge. No scleral icterus.  Pupils small but reactive  Neck: Normal range of motion. Neck supple. No JVD present. No thyromegaly present.  Cardiovascular: Regular rhythm, normal heart sounds and intact distal pulses.  Tachycardia present.  Exam reveals no gallop and no friction rub.   No murmur heard. Pulmonary/Chest: Effort normal and breath sounds normal. No respiratory distress. She has no wheezes. She has no rales.  Abdominal: Soft. Bowel sounds are normal. She exhibits no distension and no mass. There is no tenderness.  Musculoskeletal: Normal range of motion. She exhibits no edema or tenderness.  Lymphadenopathy:    She has no cervical adenopathy.  Neurological: She is alert. Coordination normal.  Skin: Skin is warm and dry. No rash noted. No erythema.  Psychiatric: She has a normal mood and affect. Her behavior is normal.  Nursing note and vitals reviewed.   ED Course  Procedures  DIAGNOSTIC STUDIES: Oxygen Saturation is 100% on RA, normal by my  interpretation.    COORDINATION OF CARE: 2:02 AM Will consult poison control, administer fluids and ativan. Discussed treatment plan with pt at bedside and pt agreed to plan.  2:11 AM Consulted poison control who recommends hydration, benzos, and observation for at least 6 hours.  CRITICAL CARE Performed by: Darl Householder, Aaria Happ   Total critical care time: 30 minutes  Critical care time was exclusive of separately billable procedures and treating other patients.  Critical care was necessary to treat or prevent imminent or life-threatening deterioration.  Critical care was time spent personally by me on the following activities: development of treatment plan with patient and/or surrogate as well as nursing, discussions with consultants, evaluation of patient's response to treatment, examination of patient, obtaining history from patient or surrogate, ordering and performing treatments and interventions, ordering and review  of laboratory studies, ordering and review of radiographic studies, pulse oximetry and re-evaluation of patient's condition.    Labs Review Labs Reviewed  COMPREHENSIVE METABOLIC PANEL - Abnormal; Notable for the following:    Potassium 2.6 (*)    CO2 19 (*)    Calcium 8.8 (*)    Total Protein 6.1 (*)    All other components within normal limits  VALPROIC ACID LEVEL - Abnormal; Notable for the following:    Valproic Acid Lvl 48 (*)    All other components within normal limits  HCG, QUANTITATIVE, PREGNANCY - Abnormal; Notable for the following:    hCG, Beta Chain, Quant, S 7423 (*)    All other components within normal limits  I-STAT BETA HCG BLOOD, ED ([facility], [facility], [facility] ONLY) - Abnormal; Notable for the following:    I-stat hCG, quantitative >2000.0 (*)    All other components within normal limits  CBC WITH DIFFERENTIAL/PLATELET  ETHANOL  ACETAMINOPHEN LEVEL  SALICYLATE LEVEL  MAGNESIUM  URINE RAPID DRUG SCREEN, HOSP PERFORMED  URINALYSIS, ROUTINE W REFLEX MICROSCOPIC  (NOT AT [facility])    Imaging Review No results found. I have personally reviewed and evaluated these images and lab results as part of my medical decision-making.   EKG Interpretation   Date/Time:  Sunday July 10 2015 01:59:47 EDT Ventricular Rate:  165 PR Interval:  104 QRS Duration: 101 QT Interval:  356 QTC Calculation: 590 R Axis:   101 Text Interpretation:  Sinus tachycardia Borderline right axis deviation  Low voltage, precordial leads Minimal ST depression, diffuse leads  Prolonged QT interval unchanged since preivous  Confirmed by Quadarius Henton  MD,  Laniece Hornbaker ([id number]) on 07/10/2015 2:12:46 AM      MDM   Final diagnoses:  Pelvic pain in pregnancy    [name] is a 25 y.o. female here with intentional benadryl overdose. She is tachycardic and has prolonged QTc. She has occasional jerking movements but no actual seizures. I called poison control, who recommend checking K and Mag and replete as needed and IVF and benzos.   5:30 AM HCG 7000. K 2.6. Supplemented. Still tachycardic despite IVF. Still altered. I did bedside US and saw enlarged uterus but unable to see good IUP but saw now pelvic fluid to suggest ruptured ectopic and she denies abdominal pain. Radiology attempted [name] but unable to get consent. I doubt ectopic. She is a most a month pregnant based on dates. Will admit to stepdown for anticholinergic overdose.    I personally performed the services described in this documentation, which was scribed in my presence. The recorded information has been reviewed and is accurate.   Evelyn Arthurs, MD 07/10/15 [phone]

## 2015-07-10 NOTE — Progress Notes (Addendum)
[facility] PLAN OF CARE NOTE Patient: [name] [id number]   PCP: No primary care provider on file. DOB: 1990/05/24   DOA: 07/10/2015   DOS: 07/10/2015    Patient was admitted by my colleague Dr.Carter earlier on 07/10/2015. I have reviewed the H&P as well as assessment and plan and agree with the same. Important changes in the plan are listed below.  Plan of care: Principal Problem:   Diphenhydramine drug overdose Unclear whether this is intentional or not. History is limited. Psychiatric consult once the patient is communicative and stable.  Active Problems:   History of seizure When necessary lorazepam Keppra initiated.    Pregnancy Recheck hCG ordered 07/12/2015. Follow-up with OB/GYN the high risk clinic at [facility] per Dr. Vale Haven note.  Changing IV fluids from normal saline and 40 mEq of potassium to D5 half normal saline with 40 mg of potassium. Once psychiatry evaluates the patient need to resume patient's home psychotropic medications including prazosin.  Author: Berle Mull, MD [facility] Pager: [phone] 07/10/2015 4:41 PM   If 7PM-7AM, please contact night-coverage at [email or web address], password [facility]

## 2015-07-10 NOTE — Progress Notes (Signed)
I spoke with Dr. Nehemiah Settle, on call for the [facility] (patient seen by his partners in April).  He has confirmed that depakote is typically avoided in pregnancy.  Preferred anti-epileptic drug is keppra, even though it is Category C.  We will stop depakote now and start keppra 500mg  IV q12h.  Monitor for seizure.  May still need to confer with neurology prior to discharge for recommendations on maintenance dosing.  From obstetrics standpoint, I have ordered a repeat serum beta HCG quantitative level in 48 hours.  The patient will need a repeat ultrasound in two weeks.  She will need to be referred to the [place] [facility] at [facility].

## 2015-07-10 NOTE — ED Notes (Addendum)
Pt in [name]; pt unable to verbally consent for transvaginal US. EDP made aware of situation. [name] tech to attempt abdominal imaging

## 2015-07-10 NOTE — ED Notes (Signed)
Pt states she is hallucinating. Unable to say what she is seeing.

## 2015-07-10 NOTE — ED Notes (Signed)
Transferring pt to [place] with this RN and NT.

## 2015-07-10 NOTE — ED Notes (Signed)
Per EMS, pt from home for a drug overdose. Per EMS, pt took 80-90 50mg   Diphenhydramine HCL tablets. BP-120/74, HR-170

## 2015-07-10 NOTE — H&P (Signed)
History and Physical    [name] [id number] DOB: 1990/10/21 DOA: 07/10/2015  PCP: No primary care provider on file. Last seen by provider in Urgent Care Receives psychiatric care through [facility]  Patient coming from: Home  Chief Complaint: Benadryl overdose  HPI: [name] is a 25 y.o. woman with a history of schizoaffective disorder, depression, seizure, and prior suicidal ideation who reportedly call EMS herself after taking at least 18 50mg  benadryl tablets, presumably as a suicide attempt.  The patient is unable to give any meaningful history at this point.  I spoke to her husband by phone, and he really did not provide any additional details different from what has been documented.  Essentially, the majority of this H/P taken from the EMR due to patient's altered mental status secondary to drug overdose.    At this point, she does not admit to any chest pain, shortness of breath, or abdominal pain.  She is anxious and restless, and her pupils are dilated.  She has been hallucinating.  No stigmata of seizure activity at this point.  No known urinary output since arrival to the ED.  ED Course:  The patient has prolonged QT interval on EKG, sinus tachycardia, hypokalemia, and a metabolic acidosis, all of which are related to her overdose.  Poison control contacted by EMS personnel.  Plan of care at this point is aggressive hydration and benzodiazepine use as needed for agitation.  Of note, the patient was a positive beta HCG consistent with pregnancy.  Transvaginal ultrasound attempted but could not be completed due to patient noncompliance with exam.  Transabdominal images were obtained.  The patient has received 2L NS, a third 1L bolus has been ordered.  She has received at least two doses of IV ativan.  Potassium replacement has been ordered.  Depakote level mildly subtherapeutic.  Review of Systems: Limited by mental status changes.  Past Medical History  Diagnosis Date    . Asthma   . Depression   . Schizoaffective disorder, bipolar type ([place])   . Autism   . Essential tremor   . Chronic constipation   . Acid reflux   . Drug-seeking behavior   . Seizures [facility])     Past Surgical History  Procedure Laterality Date  . Mouth surgery       reports that she has never smoked. She does not have any smokeless tobacco history on file. She reports that she does not drink alcohol or use illicit drugs.  Denies tobacco, EtOH, or illicit drug use (husband able to confirm).  No children.  Patient reports prior miscarriage.  Allergies  Allergen Reactions  . Bee Venom Anaphylaxis  . Coconut Flavor Anaphylaxis  . Geodon [Ziprasidone Hcl] Other (See Comments)    Paralysis of mouth  . Haloperidol And Related Other (See Comments)    Paralysis of mouth   . Lithium Other (See Comments)    Seizure like muscle spasms   . Oxycodone Other (See Comments)    hallucinations   . Seroquel [Quetiapine Fumarate] Other (See Comments)    TOO STRONG  . Prilosec [Omeprazole] Nausea And Vomiting  . Milk-Related Compounds Nausea And Vomiting    Patient can eat dairy but can not drink "straight milk".  Marland Kitchen Percocet [Oxycodone-Acetaminophen] Other (See Comments)    Causes hallucinations.  . Shellfish Allergy Swelling  . Tegretol [Carbamazepine] Nausea And Vomiting    Family History  Problem Relation Age of Onset  . Mental illness Father   . Asthma  Father   . PDD Brother   . Seizures Brother     Prior to Admission medications   Medication Sig Start Date End Date Taking? Authorizing Provider  diphenhydrAMINE (BENADRYL) 25 MG tablet Take 50 mg by mouth once.    Yes Historical Provider, MD  acetaminophen (TYLENOL) 500 MG tablet Take 1,000 mg by mouth every 6 (six) hours as needed for moderate pain.    Historical Provider, MD  albuterol (PROVENTIL HFA;VENTOLIN HFA) 108 (90 Base) MCG/ACT inhaler Inhale 1-2 puffs into the lungs every 6 (six) hours as needed for wheezing or  shortness of breath. Reported on 05/05/2015    Historical Provider, MD  ARIPiprazole (ABILIFY) 15 MG tablet Take 30 mg by mouth daily.     Historical Provider, MD  benzonatate (TESSALON) 100 MG capsule Take 1-2 capsules (100-200 mg total) by mouth 3 (three) times daily as needed for cough. 06/14/15   Dorian Heckle English, PA  Biotin (BIOTIN MAXIMUM STRENGTH) 10 MG TABS Take 1 tablet by mouth at bedtime. Reported on 06/14/2015    Historical Provider, MD  cholecalciferol (VITAMIN D-400) 400 UNITS TABS tablet Take 800 Units by mouth daily.    Historical Provider, MD  divalproex (DEPAKOTE ER) 500 MG 24 hr tablet Take 2 tablets (1,000 mg total) by mouth daily. 05/19/15   Britt Bottom, MD  docusate sodium (COLACE) 100 MG capsule Take 1 capsule (100 mg total) by mouth at bedtime. Patient not taking: Reported on 06/14/2015 03/29/14   Niel Hummer, NP  doxylamine, Sleep, (UNISOM) 25 MG tablet Take 25 mg by mouth at bedtime as needed.    Historical Provider, MD  fluticasone (FLOVENT HFA) 220 MCG/ACT inhaler Inhale into the lungs 2 (two) times daily.    Historical Provider, MD  Guaifenesin (MUCINEX MAXIMUM STRENGTH) 1200 MG TB12 Take 1 tablet (1,200 mg total) by mouth every 12 (twelve) hours as needed. 06/14/15   Dorian Heckle English, PA  ibuprofen (ADVIL,MOTRIN) 800 MG tablet Take 1 tablet (800 mg total) by mouth 3 (three) times daily. 05/06/15   Delsa Grana, PA-C  meloxicam (MOBIC) 15 MG tablet Take 1 tablet (15 mg total) by mouth daily. Patient not taking: Reported on 06/14/2015 04/12/15   Nolon Rod, DO  prazosin (MINIPRESS) 1 MG capsule Take 1 capsule (1 mg total) by mouth at bedtime. Patient taking differently: Take 2 mg by mouth at bedtime.  03/29/14   Niel Hummer, NP  Prenatal Vit-Fe Fumarate-FA (PRENATAL MULTIVITAMIN) TABS tablet Take 1 tablet by mouth at bedtime.    Historical Provider, MD  senna (SENOKOT) 8.6 MG TABS tablet Take 2 tablets by mouth at bedtime.    Historical Provider, MD    Physical  Exam: Filed Vitals:   07/10/15 0330 07/10/15 0345 07/10/15 0532 07/10/15 0609  BP: 120/63 117/67 131/64 122/64  Pulse: 121 121 134 146  Temp:      Resp: 27 22 32 28  SpO2: 99% 100% 99% 99%      Constitutional: anxious, restless but NAD, protecting her airway  Filed Vitals:   07/10/15 0330 07/10/15 0345 07/10/15 0532 07/10/15 0609  BP: 120/63 117/67 131/64 122/64  Pulse: 121 121 134 146  Temp:      Resp: 27 22 32 28  SpO2: 99% 100% 99% 99%   Eyes: pupils are dilated but reactive bilaterally, lids and conjunctivae normal ENMT: Mucous membranes are VERY DRY. Posterior pharynx clear of any exudate or lesions.Normal dentition.  Neck: normal, supple Respiratory: clear to auscultation bilaterally,  no wheezing, no crackles. Normal respiratory effort. No accessory muscle use.  Cardiovascular: Tachycardic but regular.  No murmurs / rubs / gallops. No extremity edema. 2+ pedal pulses.  Abdomen: RUQ and pelvic tenderness.  No hepatosplenomegaly.  Bowel sounds positive.  Musculoskeletal: no clubbing / cyanosis. No joint deformity upper and lower extremities. Good ROM, no contractures. Normal muscle tone.  Skin: no rashes, warm, dry but not particularly flushed at this point Neurologic: Compliance with exam limited by mental status changes but moves all four extremities spontaneously.  Follows most commands but not consistently.  Tongue is midline.  Speech is garbled (though some things can be understood; attributed to medication at this point). Psychiatric: Impaired judgement.  No insight at this point.  Flat affect.    Labs on Admission: I have personally reviewed following labs and imaging studies  CBC:  Recent Labs Lab 07/10/15 0213  WBC 10.3  NEUTROABS 6.0  HGB 12.5  HCT 38.9  MCV 86.6  PLT 123XX123   Basic Metabolic Panel:  Recent Labs Lab 07/10/15 0213  NA 137  K 2.6*  CL 109  CO2 19*  GLUCOSE 97  BUN 8  CREATININE 0.80  CALCIUM 8.8*  MG 1.8   GFR: CrCl cannot be  calculated (Unknown ideal weight.). Liver Function Tests:  Recent Labs Lab 07/10/15 0213  AST 20  ALT 15  ALKPHOS 47  BILITOT 0.4  PROT 6.1*  ALBUMIN 3.5   Urine analysis: Pending  UDS: Pending  CK level: Pending  Radiological Exams on Admission: US Ob Comp Less 14 Wks  07/10/2015  CLINICAL DATA:  Pelvic pain in first-trimester pregnancy. Quantitative beta HCG 7,400. EXAM: OBSTETRIC <14 WK ULTRASOUND TECHNIQUE: Transabdominal ultrasound was performed for evaluation of the gestation as well as the maternal uterus and adnexal regions. COMPARISON:  05/10/2015 FINDINGS: No definitive intra or extrauterine gestational sac is identified. A 5 mm cystic structure is noted in the endometrial cavity. Intra-ovarian cystic structure on the right consistent with follicular process. No adnexal mass or hemoperitoneum. Consent for transvaginal examination could not be obtained from patient. IMPRESSION: 5 mm cystic structure in the endometrial cavity could reflect an early gestational sac but is indeterminate on this transabdominal study. No adnexal mass or pelvic fluid. Recommend follow-up quantitative B-HCG levels and follow-up US in 14 days to confirm and assess viability. This recommendation follows SRU consensus guidelines: Diagnostic Criteria for Nonviable Pregnancy Early in the First Trimester. [place] Med [id number]. Electronically Signed   By: Monte Fantasia M.D.   On: 07/10/2015 05:37    EKG: Independently reviewed.  Sinus tachycardia, HR 160's.  Prolonged QT interval 590  Assessment/Plan Principal Problem:   Anticholinergic drug overdose Active Problems:   Schizoaffective disorder, bipolar type (HCC)   Suicidal ideations   History of seizure   Pregnancy  Anticholinergic toxicity, benadryl overdose --Aggressive hydration --IV ativan PRN --Supportive care --UDS pending --U/A pending --CK level pending  Hypokalemia and metabolic acidosis --Oral and IV potassium ordered in  the ED --Will continue NS with 40 mEq KCl at 125 cc/hr for now.  Consider bicarb drip if acidosis is refractory.  BMP q6h x4 --Clear liquid diet only for now if she passes bedside swallow screen  Prolonged QT interval and sinus tachycardia secondary to benadryl overdose --Monitor on telemetry --Avoid agents that can contribute --Holding most medications for now  Pregnancy --Will need to see obstetrics at some point, particularly if there are concerns about viability of the pregnancy --Continue prenatal vitamin, vitamin  D.  Holding all other medications at this point.  History of schizoaffective disorder --Hold abilify for now --Will need [facility] consult  Suicidal ideation --Will need [facility] consult when hemodynamically stable --Sitter at bedside --Suicide precautions --Fall precautions  History of seizure --Depakote level slightly subtherapeutic but depakote would be contraindicated with pregnancy.  Will need to discuss with obstetrics.  Neurohospitalist recommended that I confer with OB re: acceptable AED therapy during pregnancy. --Seizure precautions   DVT prophylaxis: SCDs Code Status: FULL Family Communication: Spoke with husband briefly by phone.  Also notified father of admission (I could not reach the husband initially). Disposition Plan: [facility] when hemodynamically stable Consults called: Awaiting call back from obstetrics on call Admission status: Inpatient, stepdown unit   Eber Jones MD [facility]  If 7PM-7AM, please contact night-coverage [email or web address] Password [facility]  07/10/2015, 6:36 AM

## 2015-07-10 NOTE — Progress Notes (Signed)
Attempted to call report x 1  

## 2015-07-10 NOTE — ED Notes (Signed)
Attempted report x1. 

## 2015-07-11 ENCOUNTER — Telehealth: Payer: Self-pay | Admitting: Neurology

## 2015-07-11 DIAGNOSIS — Z8669 Personal history of other diseases of the nervous system and sense organs: Secondary | ICD-10-CM

## 2015-07-11 DIAGNOSIS — T1491 Suicide attempt: Secondary | ICD-10-CM

## 2015-07-11 DIAGNOSIS — T450X2A Poisoning by antiallergic and antiemetic drugs, intentional self-harm, initial encounter: Principal | ICD-10-CM

## 2015-07-11 DIAGNOSIS — T450X4S Poisoning by antiallergic and antiemetic drugs, undetermined, sequela: Secondary | ICD-10-CM

## 2015-07-11 LAB — CBC WITH DIFFERENTIAL/PLATELET
BASOS ABS: 0 10*3/uL (ref 0.0–0.1)
BASOS PCT: 0 %
Eosinophils Absolute: 0 10*3/uL (ref 0.0–0.7)
Eosinophils Relative: 0 %
HEMATOCRIT: 36.1 % (ref 36.0–46.0)
HEMOGLOBIN: 11.7 g/dL — AB (ref 12.0–15.0)
Lymphocytes Relative: 24 %
Lymphs Abs: 2.6 10*3/uL (ref 0.7–4.0)
MCH: 27.8 pg (ref 26.0–34.0)
MCHC: 32.4 g/dL (ref 30.0–36.0)
MCV: 85.7 fL (ref 78.0–100.0)
Monocytes Absolute: 0.8 10*3/uL (ref 0.1–1.0)
Monocytes Relative: 7 %
NEUTROS ABS: 7.5 10*3/uL (ref 1.7–7.7)
NEUTROS PCT: 69 %
Platelets: 215 10*3/uL (ref 150–400)
RBC: 4.21 MIL/uL (ref 3.87–5.11)
RDW: 13.7 % (ref 11.5–15.5)
WBC: 10.9 10*3/uL — ABNORMAL HIGH (ref 4.0–10.5)

## 2015-07-11 LAB — COMPREHENSIVE METABOLIC PANEL
ALBUMIN: 3.7 g/dL (ref 3.5–5.0)
ALK PHOS: 46 U/L (ref 38–126)
ALT: 17 U/L (ref 14–54)
AST: 34 U/L (ref 15–41)
Anion gap: 9 (ref 5–15)
CALCIUM: 8.7 mg/dL — AB (ref 8.9–10.3)
CHLORIDE: 114 mmol/L — AB (ref 101–111)
CO2: 14 mmol/L — AB (ref 22–32)
CREATININE: 0.71 mg/dL (ref 0.44–1.00)
GFR calc non Af Amer: >60 mL/min (ref >60)
GLUCOSE: 93 mg/dL (ref 65–99)
Potassium: 4.6 mmol/L (ref 3.5–5.1)
SODIUM: 137 mmol/L (ref 135–145)
Total Bilirubin: 1.1 mg/dL (ref 0.3–1.2)
Total Protein: 6.2 g/dL — ABNORMAL LOW (ref 6.5–8.1)

## 2015-07-11 LAB — MAGNESIUM: Magnesium: 2 mg/dL (ref 1.7–2.4)

## 2015-07-11 MED ORDER — ALBUTEROL SULFATE (2.5 MG/3ML) 0.083% IN NEBU
2.5000 mg | INHALATION_SOLUTION | Freq: Four times a day (QID) | RESPIRATORY_TRACT | Status: DC | PRN
  Administered 2015-07-11 – 2015-07-12 (×2): 2.5 mg via RESPIRATORY_TRACT
  Filled 2015-07-11 (×2): qty 3

## 2015-07-11 MED ORDER — LEVETIRACETAM 500 MG PO TABS
500.0000 mg | ORAL_TABLET | Freq: Two times a day (BID) | ORAL | Status: DC
  Administered 2015-07-11 – 2015-07-15 (×8): 500 mg via ORAL
  Filled 2015-07-11 (×8): qty 1

## 2015-07-11 NOTE — Progress Notes (Addendum)
Patient ID: [name], female   DOB: Jan 27, 1991, 27 y.o.   MRN: [id number]  PROGRESS NOTE    [name]  [id number] DOB: [birth year] DOA: 07/10/2015  PCP: No primary care provider on file.   Brief Narrative:  25 y.o. woman with a history of schizoaffective disorder, depression, seizure, prior suicidal ideation who reportedly called EMS herself after taking at least 60 of 50mg  benadryl tablets in suicidal attempt.  Patient was hemodynamically stable on the admission. She had prolonged QT interval on EKG, sinus tachycardia. Poison control was contacted by EMS personnel. Recommendation was for aggressive hydration and benzodiazepine as needed for agitation. Beta hCG was positive. Transvaginal ultrasound was not completed because of patient noncompliance distress abdominal ultrasound shows seems to be gestational sac although too early to determine.   Assessment & Plan:  Principal problem: Suicidal attempt by overdosing on Benadryl - Patient took at least 60 tablets of 50 mg Benadryl - Urine drug screen positive for benzodiazepines otherwise negative - Alcohol was within normal limits and salicylate level was within normal limits - Tylenol was within normal limits - Psychiatry consulted - Sitter at bedside  Active Problems: Metabolic acidosis - Likely from overdosing on Benadryl - Renal function within normal limits - We'll follow-up on BMP tomorrow morning  History of seizure disorder - Valproic acid slightly lower than the baseline range, 48 - No reports of seizures - She is on Keppra 500 mg twice daily  Pregnancy - Beta hCG more than 2000 - Repeat ultrasound in 2 weeks recommended based on transabdominal ultrasound done on this admission which shows potential gestational sac   DVT prophylaxis: SCD's bilaterally  Code Status: full code  Family Communication: no family at the bedside  Disposition Plan:    Consultants:   Psychiatry 6/5  Procedures:   None    Antimicrobials:   None    Subjective: No overnight events.  Objective: Filed Vitals:   07/11/15 0150 07/11/15 0222 07/11/15 0420 07/11/15 0755  BP:  106/51 113/84 120/65  Pulse: 101 102 95 102  Temp:  98.9 F (37.2 C) 98.5 F (36.9 C)   TempSrc:  Oral Oral   Resp: 17 26 19 22   Height:      Weight:   78.654 kg (173 lb 6.4 oz)   SpO2: 99% 99% 100% 100%    Intake/Output Summary (Last 24 hours) at 07/11/15 1056 Last data filed at 07/11/15 1031  Gross per 24 hour  Intake 3255.58 ml  Output   7150 ml  Net -3894.42 ml   Filed Weights   07/10/15 0800 07/11/15 0420  Weight: 76.34 kg (168 lb 4.8 oz) 78.654 kg (173 lb 6.4 oz)    Examination:  General exam: Appears anxious Respiratory system: Clear to auscultation. Respiratory effort normal. Cardiovascular system: S1 & S2 heard, RRR. No JVD, murmurs, rubs, gallops or clicks. No pedal edema. Gastrointestinal system: Abdomen is nondistended, soft and nontender. No organomegaly or masses felt. Normal bowel sounds heard. Central nervous system: Alert and oriented. No focal neurological deficits. Extremities: Symmetric 5 x 5 power. Skin: No rashes, lesions or ulcers Psychiatry: Speech is pressured, tangential, appears nervous  Data Reviewed: I have personally reviewed following labs and imaging studies  CBC:  Recent Labs Lab 07/10/15 0213 07/11/15 0634  WBC 10.3 10.9*  NEUTROABS 6.0 7.5  HGB 12.5 11.7*  HCT 38.9 36.1  MCV 86.6 85.7  PLT 202 [id number]   Basic Metabolic Panel:  Recent Labs Lab 07/10/15 0213 07/10/15 0948 07/10/15  1401 07/11/15 0437  NA 137 136 137 137  K 2.6* 3.9 3.8 4.6  CL 109 114* 113* 114*  CO2 19* 12* 17* 14*  GLUCOSE 97 90 77 93  BUN 8 5* <5* <5*  CREATININE 0.80 0.80 0.65 0.71  CALCIUM 8.8* 7.7* 7.8* 8.7*  MG 1.8  --   --  2.0   GFR: Estimated Creatinine Clearance: 103.5 mL/min (by C-G formula based on Cr of 0.71). Liver Function Tests:  Recent Labs Lab 07/10/15 0213  07/11/15 0437  AST 20 34  ALT 15 17  ALKPHOS 47 46  BILITOT 0.4 1.1  PROT 6.1* 6.2*  ALBUMIN 3.5 3.7   No results for input(s): LIPASE, AMYLASE in the last 168 hours. No results for input(s): AMMONIA in the last 168 hours. Coagulation Profile: No results for input(s): INR, PROTIME in the last 168 hours. Cardiac Enzymes:  Recent Labs Lab 07/10/15 0948  CKTOTAL 200   BNP (last 3 results) No results for input(s): PROBNP in the last 8760 hours. HbA1C: No results for input(s): HGBA1C in the last 72 hours. CBG:  Recent Labs Lab 07/10/15 0842  GLUCAP 104*   Lipid Profile: No results for input(s): CHOL, HDL, LDLCALC, TRIG, CHOLHDL, LDLDIRECT in the last 72 hours. Thyroid Function Tests: No results for input(s): TSH, T4TOTAL, FREET4, T3FREE, THYROIDAB in the last 72 hours. Anemia Panel: No results for input(s): VITAMINB12, FOLATE, FERRITIN, TIBC, IRON, RETICCTPCT in the last 72 hours. Urine analysis:    Component Value Date/Time   COLORURINE YELLOW 07/10/2015 0700   APPEARANCEUR CLEAR 07/10/2015 0700   LABSPEC 1.025 07/10/2015 0700   PHURINE 5.5 07/10/2015 0700   GLUCOSEU NEGATIVE 07/10/2015 0700   HGBUR NEGATIVE 07/10/2015 0700   BILIRUBINUR NEGATIVE 07/10/2015 0700   BILIRUBINUR negative 06/14/2015 0930   KETONESUR 15* 07/10/2015 0700   KETONESUR trace (5)* 06/14/2015 0930   PROTEINUR NEGATIVE 07/10/2015 0700   PROTEINUR negative 06/14/2015 0930   UROBILINOGEN 0.2 06/14/2015 0930   UROBILINOGEN 0.2 03/29/2014 1548   NITRITE NEGATIVE 07/10/2015 0700   NITRITE Negative 06/14/2015 0930   LEUKOCYTESUR NEGATIVE 07/10/2015 0700   Sepsis Labs: @LABRCNTIP (procalcitonin:4,lacticidven:4)   Recent Results (from the past 240 hour(s))  MRSA PCR Screening     Status: None   Collection Time: 07/10/15  8:04 AM  Result Value Ref Range Status   MRSA by PCR NEGATIVE NEGATIVE Final      Radiology Studies: US Ob Comp Less 14 Wks 07/10/2015 5 mm cystic structure in the  endometrial cavity could reflect an early gestational sac but is indeterminate on this transabdominal study. No adnexal mass or pelvic fluid. Recommend follow-up quantitative B-HCG levels and follow-up US in 14 days to confirm and assess viability. This recommendation follows SRU consensus guidelines:    Scheduled Meds: . cholecalciferol  800 Units Oral Daily  . levETIRAcetam  500 mg Oral BID  . prenatal multivitamin  1 tablet Oral QHS  . sodium chloride flush  3 mL Intravenous Q12H   Continuous Infusions: . dextrose 5 % and 0.45 % NaCl with KCl 40 mEq/L 100 mL/hr at 07/11/15 0145     LOS: 1 day    Time spent: 25 minutes  Greater than 50% of the time spent on counseling and coordinating the care.   Leisa Lenz, MD [facility] Pager [phone]  If 7PM-7AM, please contact night-coverage [email or web address] Password [email or web address] 07/11/2015, 10:56 AM

## 2015-07-11 NOTE — Telephone Encounter (Signed)
Patient called to advise that she has been taking DEPAKOTE, wants Faith to know, hospital has switched her to [facility] because she is pregnant.

## 2015-07-11 NOTE — Progress Notes (Signed)
Initial visit with patient.  Consulted with nurse prior to visiting room.  Briefed that patient attempted suicide by ingesting an overdose of pills. She has also experienced a previous miscarriage. Patient has been diagnosed as schizophrenic and has learned while a patient she is pregnant. i met with patient she shared how happy she was to be expecting.  She also shared she plans to turn her life around and get a job to support her and her child.  She mentioned her husband is happy about the child.  She expressed remorse for attempting to end her life.  Patient expressed she has a reason to take care of herself.  Patient asked for prayer.  I prayed with her.  I made her aware that spiritual care was available if she needed it.  As i exited the patient's room I could hear her start to cry, I returned and offered further words of encouragement.

## 2015-07-11 NOTE — Consult Note (Signed)
[facility] Consult   Reason for Consult:  Intentional overdose Referring Physician:  EDP Patient Identification: [name] MRN:  [id number] Principal Diagnosis: Schizoaffective disorder, bipolar type [facility]) Diagnosis:   Patient Active Problem List   Diagnosis Date Noted  . Suicidal ideations [R45.851] 06/23/2014    Priority: High  . Schizoaffective disorder, bipolar type ([place]) [F25.0] 03/10/2014    Priority: High  . Anticholinergic drug overdose [T44.3X1A] 07/10/2015  . History of seizure [Z86.69] 07/10/2015  . Pregnancy [Z33.1] 07/10/2015  . Diphenhydramine overdose of undetermined intent [T45.0X4A]   . Chronic migraine [G43.709] 05/19/2015  . Asthma [J45.909] 04/15/2015  . Convulsion ([place]) [R56.9] 04/12/2015  . Fall [W19.XXXA] 04/12/2015  . Suicidal ideation [R45.851]   . Homicidal ideation [R45.850]   . Prolonged QT syndrome [I45.81]   . EKG abnormality [R94.31] 03/12/2014  . Borderline personality disorder [F60.3] 03/10/2014  . PTSD (post-traumatic stress disorder) [F43.10] 03/10/2014  . Asperger's syndrome [F84.5] 03/10/2014    Total Time spent with patient: 45 minutes  Subjective:   [name] is a 25 y.o. female patient will need to be admitted for suicide attempt.  Dr. Dwyane Dee as reviewed this patient and concurs with the findings, transfer to inpatient psychiatric hospital.  HPI:  25 yo female who presented to the ED after overdosing on 90 Benadryl in a suicide attempt.  Today, she reports she was upset that she was not pregnant but found out while in the hospital she is.  Her husband is supportive and at her bedside, married since [place] since September.  She reports at least twenty overdoses prior to the age of 83 but none in six years until now. "I shouldn't have done it."  Denies homicidal or suicidal ideations at this time along with hallucinations and alcohol/drug abuse.  Reports PTSD fShe is excited about the baby and her preference  is to discharge and go to her primary care MD appointment tomorrow at 1 pm and her psychiatric appointment on Wednesday at [facility].  She does have an ACT team.  Her preference is to come to the psychiatric hospital voluntarily vs. IVC.    Past Psychiatric History: schizoaffective disorder, PTSD, borderline personality  Risk to Self: Is patient at risk for suicide?: Yes Risk to Others:   Prior Inpatient Therapy:   Prior Outpatient Therapy:    Past Medical History:  Past Medical History  Diagnosis Date  . Asthma   . Depression   . Schizoaffective disorder, bipolar type ([place])   . Autism   . Essential tremor   . Chronic constipation   . Acid reflux   . Drug-seeking behavior   . Seizures [facility])     Past Surgical History  Procedure Laterality Date  . Mouth surgery     Family History:  Family History  Problem Relation Age of Onset  . Mental illness Father   . Asthma Father   . PDD Brother   . Seizures Brother    Family Psychiatric  History: none  Social History:  History  Alcohol Use No     History  Drug Use No    Social History   Social History  . Marital Status: Single    Spouse Name: N/A  . Number of Children: 0  . Years of Education: N/A   Occupational History  . disability    Social History Main Topics  . Smoking status: Never Smoker   . Smokeless tobacco: None  . Alcohol Use: No  . Drug Use: No  .  Sexual Activity: Yes    Birth Control/ Protection: None   Other Topics Concern  . None   Social History Narrative   Marital status: married; guardian is Furniture conservator/restorer      Children: none      Lives: with husband in apartment      Employment:  Disability      Tobacco: quit smoking; smoked for two years.      Alcohol ;none      Drugs: none           Additional Social History:    Allergies:   Allergies  Allergen Reactions  . Bee Venom Anaphylaxis  . Coconut Flavor Anaphylaxis  . Geodon [Ziprasidone Hcl] Other (See Comments)    Paralysis of mouth  .  Haloperidol And Related Other (See Comments)    Paralysis of mouth   . Lithium Other (See Comments)    Seizure like muscle spasms   . Oxycodone Other (See Comments)    hallucinations   . Seroquel [Quetiapine Fumarate] Other (See Comments)    TOO STRONG  . Prilosec [Omeprazole] Nausea And Vomiting  . Milk-Related Compounds Nausea And Vomiting    Patient can eat dairy but can not drink "straight milk".  Marland Kitchen Percocet [Oxycodone-Acetaminophen] Other (See Comments)    Causes hallucinations.  . Shellfish Allergy Swelling  . Tegretol [Carbamazepine] Nausea And Vomiting    Labs:  Results for orders placed or performed during the hospital encounter of 07/10/15 (from the past 48 hour(s))  CBC with Differential     Status: None   Collection Time: 07/10/15  2:13 AM  Result Value Ref Range   WBC 10.3 4.0 - 10.5 K/uL   RBC 4.49 3.87 - 5.11 MIL/uL   Hemoglobin 12.5 12.0 - 15.0 g/dL   HCT 38.9 36.0 - 46.0 %   MCV 86.6 78.0 - 100.0 fL   MCH 27.8 26.0 - 34.0 pg   MCHC 32.1 30.0 - 36.0 g/dL   RDW 13.1 11.5 - 15.5 %   Platelets 202 150 - 400 K/uL   Neutrophils Relative % 58 %   Neutro Abs 6.0 1.7 - 7.7 K/uL   Lymphocytes Relative 35 %   Lymphs Abs 3.6 0.7 - 4.0 K/uL   Monocytes Relative 7 %   Monocytes Absolute 0.7 0.1 - 1.0 K/uL   Eosinophils Relative 0 %   Eosinophils Absolute 0.0 0.0 - 0.7 K/uL   Basophils Relative 0 %   Basophils Absolute 0.0 0.0 - 0.1 K/uL  Comprehensive metabolic panel     Status: Abnormal   Collection Time: 07/10/15  2:13 AM  Result Value Ref Range   Sodium 137 135 - 145 mmol/L   Potassium 2.6 (LL) 3.5 - 5.1 mmol/L    Comment: CRITICAL RESULT CALLED TO, READ BACK BY AND VERIFIED WITH: BELCHER,B RN 07/10/2015 0249 JORDANS    Chloride 109 101 - 111 mmol/L   CO2 19 (L) 22 - 32 mmol/L   Glucose, Bld 97 65 - 99 mg/dL   BUN 8 6 - 20 mg/dL   Creatinine, Ser 0.80 0.44 - 1.00 mg/dL   Calcium 8.8 (L) 8.9 - 10.3 mg/dL   Total Protein 6.1 (L) 6.5 - 8.1 g/dL   Albumin  3.5 3.5 - 5.0 g/dL   AST 20 15 - 41 U/L   ALT 15 14 - 54 U/L   Alkaline Phosphatase 47 38 - 126 U/L   Total Bilirubin 0.4 0.3 - 1.2 mg/dL   GFR calc non Af Amer >  60 >60 mL/min   GFR calc Af Amer >60 >60 mL/min    Comment: (NOTE) The eGFR has been calculated using the CKD EPI equation. This calculation has not been validated in all clinical situations. eGFR's persistently <60 mL/min signify possible Chronic Kidney Disease.    Anion gap 9 5 - 15  Ethanol     Status: None   Collection Time: 07/10/15  2:13 AM  Result Value Ref Range   Alcohol, Ethyl (B) <5 <5 mg/dL    Comment:        LOWEST DETECTABLE LIMIT FOR SERUM ALCOHOL IS 5 mg/dL FOR MEDICAL PURPOSES ONLY   Valproic acid level     Status: Abnormal   Collection Time: 07/10/15  2:13 AM  Result Value Ref Range   Valproic Acid Lvl 48 (L) 50.0 - 100.0 ug/mL  Acetaminophen level     Status: None   Collection Time: 07/10/15  2:13 AM  Result Value Ref Range   Acetaminophen (Tylenol), Serum 15 10 - 30 ug/mL    Comment:        THERAPEUTIC CONCENTRATIONS VARY SIGNIFICANTLY. A RANGE OF 10-30 ug/mL MAY BE AN EFFECTIVE CONCENTRATION FOR MANY PATIENTS. HOWEVER, SOME ARE BEST TREATED AT CONCENTRATIONS OUTSIDE THIS RANGE. ACETAMINOPHEN CONCENTRATIONS >150 ug/mL AT 4 HOURS AFTER INGESTION AND >50 ug/mL AT 12 HOURS AFTER INGESTION ARE OFTEN ASSOCIATED WITH TOXIC REACTIONS.   Salicylate level     Status: None   Collection Time: 07/10/15  2:13 AM  Result Value Ref Range   Salicylate Lvl <2.4 2.8 - 30.0 mg/dL  hCG, quantitative, pregnancy     Status: Abnormal   Collection Time: 07/10/15  2:13 AM  Result Value Ref Range   hCG, Beta Chain, Quant, S 7423 (H) <5 mIU/mL    Comment:          GEST. AGE      CONC.  (mIU/mL)   <=1 WEEK        5 - 50     2 WEEKS       50 - 500     3 WEEKS       100 - 10,000     4 WEEKS     1,000 - 30,000     5 WEEKS     3,500 - 115,000   6-8 WEEKS     12,000 - 270,000    12 WEEKS     15,000 -  220,000        FEMALE AND NON-PREGNANT FEMALE:     LESS THAN 5 mIU/mL   Magnesium     Status: None   Collection Time: 07/10/15  2:13 AM  Result Value Ref Range   Magnesium 1.8 1.7 - 2.4 mg/dL  I-Stat Beta hCG blood, ED ([facility], [facility], [facility] only)     Status: Abnormal   Collection Time: 07/10/15  2:17 AM  Result Value Ref Range   I-stat hCG, quantitative >2000.0 (H) <5 mIU/mL   Comment 3            Comment:   GEST. AGE      CONC.  (mIU/mL)   <=1 WEEK        5 - 50     2 WEEKS       50 - 500     3 WEEKS       100 - 10,000     4 WEEKS     1,000 - 30,000        FEMALE AND  NON-PREGNANT FEMALE:     LESS THAN 5 mIU/mL   Urine rapid drug screen (hosp performed)     Status: Abnormal   Collection Time: 07/10/15  7:00 AM  Result Value Ref Range   Opiates NONE DETECTED NONE DETECTED   Cocaine NONE DETECTED NONE DETECTED   Benzodiazepines POSITIVE (A) NONE DETECTED   Amphetamines NONE DETECTED NONE DETECTED   Tetrahydrocannabinol NONE DETECTED NONE DETECTED   Barbiturates NONE DETECTED NONE DETECTED    Comment:        DRUG SCREEN FOR MEDICAL PURPOSES ONLY.  IF CONFIRMATION IS NEEDED FOR ANY PURPOSE, NOTIFY LAB WITHIN 5 DAYS.        LOWEST DETECTABLE LIMITS FOR URINE DRUG SCREEN Drug Class       Cutoff (ng/mL) Amphetamine      1000 Barbiturate      200 Benzodiazepine   932 Tricyclics       671 Opiates          300 Cocaine          300 THC              50   Urinalysis, Routine w reflex microscopic (not at [facility])     Status: Abnormal   Collection Time: 07/10/15  7:00 AM  Result Value Ref Range   Color, Urine YELLOW YELLOW   APPearance CLEAR CLEAR   Specific Gravity, Urine 1.025 1.005 - 1.030   pH 5.5 5.0 - 8.0   Glucose, UA NEGATIVE NEGATIVE mg/dL   Hgb urine dipstick NEGATIVE NEGATIVE   Bilirubin Urine NEGATIVE NEGATIVE   Ketones, ur 15 (A) NEGATIVE mg/dL   Protein, ur NEGATIVE NEGATIVE mg/dL   Nitrite NEGATIVE NEGATIVE   Leukocytes, UA NEGATIVE NEGATIVE    Comment: MICROSCOPIC NOT  DONE ON URINES WITH NEGATIVE PROTEIN, BLOOD, LEUKOCYTES, NITRITE, OR GLUCOSE <1000 mg/dL.  MRSA PCR Screening     Status: None   Collection Time: 07/10/15  8:04 AM  Result Value Ref Range   MRSA by PCR NEGATIVE NEGATIVE    Comment:        The GeneXpert MRSA Assay (FDA approved for NASAL specimens only), is one component of a comprehensive MRSA colonization surveillance program. It is not intended to diagnose MRSA infection nor to guide or monitor treatment for MRSA infections.   Glucose, capillary     Status: Abnormal   Collection Time: 07/10/15  8:42 AM  Result Value Ref Range   Glucose-Capillary 104 (H) 65 - 99 mg/dL  CK     Status: None   Collection Time: 07/10/15  9:48 AM  Result Value Ref Range   Total CK 200 38 - 234 U/L  Basic metabolic panel     Status: Abnormal   Collection Time: 07/10/15  9:48 AM  Result Value Ref Range   Sodium 136 135 - 145 mmol/L   Potassium 3.9 3.5 - 5.1 mmol/L    Comment: DELTA CHECK NOTED   Chloride 114 (H) 101 - 111 mmol/L   CO2 12 (L) 22 - 32 mmol/L   Glucose, Bld 90 65 - 99 mg/dL   BUN 5 (L) 6 - 20 mg/dL   Creatinine, Ser 0.80 0.44 - 1.00 mg/dL   Calcium 7.7 (L) 8.9 - 10.3 mg/dL   GFR calc non Af Amer >60 >60 mL/min   GFR calc Af Amer >60 >60 mL/min    Comment: (NOTE) The eGFR has been calculated using the CKD EPI equation. This calculation has not been validated in all  clinical situations. eGFR's persistently <60 mL/min signify possible Chronic Kidney Disease.    Anion gap 10 5 - 15  Basic metabolic panel     Status: Abnormal   Collection Time: 07/10/15  2:01 PM  Result Value Ref Range   Sodium 137 135 - 145 mmol/L   Potassium 3.8 3.5 - 5.1 mmol/L   Chloride 113 (H) 101 - 111 mmol/L   CO2 17 (L) 22 - 32 mmol/L   Glucose, Bld 77 65 - 99 mg/dL   BUN <5 (L) 6 - 20 mg/dL   Creatinine, Ser 0.65 0.44 - 1.00 mg/dL   Calcium 7.8 (L) 8.9 - 10.3 mg/dL   GFR calc non Af Amer >60 >60 mL/min   GFR calc Af Amer >60 >60 mL/min     Comment: (NOTE) The eGFR has been calculated using the CKD EPI equation. This calculation has not been validated in all clinical situations. eGFR's persistently <60 mL/min signify possible Chronic Kidney Disease.    Anion gap 7 5 - 15  Comprehensive metabolic panel     Status: Abnormal   Collection Time: 07/11/15  4:37 AM  Result Value Ref Range   Sodium 137 135 - 145 mmol/L   Potassium 4.6 3.5 - 5.1 mmol/L    Comment: DELTA CHECK NOTED   Chloride 114 (H) 101 - 111 mmol/L   CO2 14 (L) 22 - 32 mmol/L   Glucose, Bld 93 65 - 99 mg/dL   BUN <5 (L) 6 - 20 mg/dL   Creatinine, Ser 0.71 0.44 - 1.00 mg/dL   Calcium 8.7 (L) 8.9 - 10.3 mg/dL   Total Protein 6.2 (L) 6.5 - 8.1 g/dL   Albumin 3.7 3.5 - 5.0 g/dL   AST 34 15 - 41 U/L   ALT 17 14 - 54 U/L   Alkaline Phosphatase 46 38 - 126 U/L   Total Bilirubin 1.1 0.3 - 1.2 mg/dL   GFR calc non Af Amer >60 >60 mL/min   GFR calc Af Amer >60 >60 mL/min    Comment: (NOTE) The eGFR has been calculated using the CKD EPI equation. This calculation has not been validated in all clinical situations. eGFR's persistently <60 mL/min signify possible Chronic Kidney Disease.    Anion gap 9 5 - 15  Magnesium     Status: None   Collection Time: 07/11/15  4:37 AM  Result Value Ref Range   Magnesium 2.0 1.7 - 2.4 mg/dL  CBC with Differential/Platelet     Status: Abnormal   Collection Time: 07/11/15  6:34 AM  Result Value Ref Range   WBC 10.9 (H) 4.0 - 10.5 K/uL   RBC 4.21 3.87 - 5.11 MIL/uL   Hemoglobin 11.7 (L) 12.0 - 15.0 g/dL   HCT 36.1 36.0 - 46.0 %   MCV 85.7 78.0 - 100.0 fL   MCH 27.8 26.0 - 34.0 pg   MCHC 32.4 30.0 - 36.0 g/dL   RDW 13.7 11.5 - 15.5 %   Platelets 215 150 - 400 K/uL   Neutrophils Relative % 69 %   Neutro Abs 7.5 1.7 - 7.7 K/uL   Lymphocytes Relative 24 %   Lymphs Abs 2.6 0.7 - 4.0 K/uL   Monocytes Relative 7 %   Monocytes Absolute 0.8 0.1 - 1.0 K/uL   Eosinophils Relative 0 %   Eosinophils Absolute 0.0 0.0 - 0.7 K/uL    Basophils Relative 0 %   Basophils Absolute 0.0 0.0 - 0.1 K/uL    Current Facility-Administered Medications  Medication Dose Route Frequency Provider Last Rate Last Dose  . acetaminophen (TYLENOL) tablet 650 mg  650 mg Oral Q6H PRN Lily Kocher, MD       Or  . acetaminophen (TYLENOL) suppository 650 mg  650 mg Rectal Q6H PRN Lily Kocher, MD      . albuterol (PROVENTIL) (2.5 MG/3ML) 0.083% nebulizer solution 2.5 mg  2.5 mg Nebulization Q6H PRN Hewitt Shorts Harduk, PA-C   2.5 mg at 07/11/15 0150  . cholecalciferol (VITAMIN D) tablet 800 Units  800 Units Oral Daily Lily Kocher, MD   800 Units at 07/10/15 [phone]  . dextrose 5 % and 0.45 % NaCl with KCl 40 mEq/L infusion   Intravenous Continuous Lavina Hamman, MD 100 mL/hr at 07/11/15 1132    . levETIRAcetam (KEPPRA) tablet 500 mg  500 mg Oral BID Robbie Lis, MD      . LORazepam (ATIVAN) injection 0.5-1 mg  0.5-1 mg Intravenous Q4H PRN Lavina Hamman, MD      . menthol-cetylpyridinium (CEPACOL) lozenge 3 mg  1 lozenge Oral PRN Hewitt Shorts Harduk, PA-C   3 mg at 07/11/15 0357  . phenol (CHLORASEPTIC) mouth spray 1 spray  1 spray Mouth/Throat PRN Lavina Hamman, MD   1 spray at 07/10/15 2337  . prenatal multivitamin tablet 1 tablet  1 tablet Oral QHS Lily Kocher, MD   1 tablet at 07/10/15 2118  . sodium chloride flush (NS) 0.9 % injection 3 mL  3 mL Intravenous Q12H Lily Kocher, MD   3 mL at 07/11/15 0828    Musculoskeletal: Strength & Muscle Tone: within normal limits Gait & Station: normal Patient leans: N/A  Psychiatric Specialty Exam: Physical Exam  Constitutional: She is oriented to person, place, and time. She appears well-developed and well-nourished.  HENT:  Head: Normocephalic.  Neck: Normal range of motion.  Respiratory: Effort normal.  Musculoskeletal: Normal range of motion.  Neurological: She is alert and oriented to person, place, and time.  Skin: Skin is warm and dry.  Psychiatric: Her speech is normal and behavior is normal.  Her mood appears anxious. Cognition and memory are normal. She expresses impulsivity. She exhibits a depressed mood. She expresses suicidal ideation.  Overdose on 90 Benadryl     Review of Systems  Constitutional: Negative.   HENT: Negative.   Eyes: Negative.   Respiratory: Negative.   Cardiovascular: Negative.   Gastrointestinal: Negative.   Genitourinary: Negative.   Musculoskeletal: Negative.   Skin: Negative.   Neurological: Negative.   Endo/Heme/Allergies: Negative.   Psychiatric/Behavioral: Positive for depression and suicidal ideas. The patient is nervous/anxious.     Blood pressure 121/63, pulse 85, temperature 98.1 F (36.7 C), temperature source Oral, resp. rate 21, height 5' 2"  (1.575 m), weight 78.654 kg (173 lb 6.4 oz), last menstrual period 06/07/2015, SpO2 100 %.Body mass index is 31.71 kg/(m^2).  General Appearance: Casual  Eye Contact:  Fair  Speech:  Normal Rate  Volume:  Normal  Mood:  Anxious and Depressed  Affect:  Non-Congruent  Thought Process:  Coherent  Orientation:  Full (Time, Place, and Person)  Thought Content:  Rumination  Suicidal Thoughts:  Yes.  with intent/plan  Homicidal Thoughts:  No  Memory:  Immediate;   Fair Recent;   Fair Remote;   Fair  Judgement:  Poor  Insight:  Lacking  Psychomotor Activity:  Decreased  Concentration:  Concentration: Fair and Attention Span: Fair  Recall:  [employer] of Knowledge:  Fair  Language:  Good  Akathisia:  No  Handed:  Right  AIMS (if indicated):     Assets:  Housing Intimacy Leisure Time Physical Health Resilience Social Support  ADL's:  Intact  Cognition:  WNL  Sleep:        Treatment Plan Summary: Daily contact with patient to assess and evaluate symptoms and progress in treatment, Medication management and Plan schizoaffective disorder, bipolar type:  -Crisis stabilization -Medication management: -Individual counseling  Disposition: Recommend psychiatric Inpatient admission when  medically cleared.  Waylan Boga, NP 07/11/2015 4:56 PM

## 2015-07-11 NOTE — Progress Notes (Signed)
Called to patients room. Upon arrival was asked by pt if there was a therapist available for her to speak with.  Have notified MD, paged social work, and paged chaplain service.  MD advised that social work or chaplain would be best available resources.  Have not received return phone call from either service.  Pt is moderately agitated at this time and is visibly fidgiting and upset. She says she wants to talk about family issues concerning her baby.  Will continue to monitor.

## 2015-07-11 NOTE — Telephone Encounter (Signed)
LMOM to let pt. know I received her message, reviewed her med list, and his correct.  Depakote has been removed and Keppra has been added/fim

## 2015-07-12 LAB — BASIC METABOLIC PANEL
Anion gap: 11 (ref 5–15)
BUN: 7 mg/dL (ref 6–20)
CALCIUM: 9.6 mg/dL (ref 8.9–10.3)
CHLORIDE: 105 mmol/L (ref 101–111)
CO2: 20 mmol/L — ABNORMAL LOW (ref 22–32)
CREATININE: 0.73 mg/dL (ref 0.44–1.00)
Glucose, Bld: 91 mg/dL (ref 65–99)
Potassium: 4.6 mmol/L (ref 3.5–5.1)
SODIUM: 136 mmol/L (ref 135–145)

## 2015-07-12 LAB — HCG, QUANTITATIVE, PREGNANCY: HCG, BETA CHAIN, QUANT, S: 14840 m[IU]/mL — AB (ref <5)

## 2015-07-12 MED ORDER — DOCUSATE SODIUM 100 MG PO CAPS
100.0000 mg | ORAL_CAPSULE | Freq: Every day | ORAL | Status: DC
  Administered 2015-07-12 – 2015-07-13 (×2): 100 mg via ORAL
  Filled 2015-07-12 (×2): qty 1

## 2015-07-12 MED ORDER — SENNA 8.6 MG PO TABS
2.0000 | ORAL_TABLET | Freq: Every day | ORAL | Status: DC
  Administered 2015-07-12 – 2015-07-14 (×3): 17.2 mg via ORAL
  Filled 2015-07-12 (×4): qty 2

## 2015-07-12 NOTE — Care Management Important Message (Signed)
Important Message  Patient Details  Name: [name] MRN: [id number] Date of Birth: 24-Nov-1990   Medicare Important Message Given:  Yes    Merik Mignano Abena 07/12/2015, 1:40 PM

## 2015-07-12 NOTE — Progress Notes (Signed)
Patient ID: [name], female   DOB: 03-28-1990, 25 y.o.   MRN: [id number]  PROGRESS NOTE    SHERRILYN TIBURCIO  [id number] DOB: 1990-09-20 DOA: 07/10/2015  PCP: No primary care provider on file.   Brief Narrative:  25 y.o. woman with a history of schizoaffective disorder, depression, seizure, prior suicidal ideation who reportedly called EMS herself after taking at least 60 of 50mg  benadryl tablets in suicidal attempt.  Patient was hemodynamically stable on the admission. She had prolonged QT interval on EKG, sinus tachycardia. Poison control was contacted by EMS personnel. Recommendation was for aggressive hydration and benzodiazepine as needed for agitation. Beta hCG was positive. Transvaginal ultrasound was not completed because of patient noncompliance distress abdominal ultrasound shows seems to be gestational sac although too early to determine.   Assessment & Plan:  Principal problem: Suicidal attempt by overdosing on Benadryl - Patient to at least 60 tablets of 50 mg Benadryl - Urine drug screen positive for benzodiazepines otherwise negative - Alcohol was within normal limits and salicylate level was within normal limits - Tylenol was within normal limits - Psychiatry consulted, inpatient psych recommended  - Sitter at bedside - pt stable for d/c once bed available   Active Problems: Metabolic acidosis - Likely from overdosing on Benadryl - Renal function within normal limits - BMP pending this AM  History of seizure disorder - Valproic acid slightly lower than the baseline range, 48 - No reports of seizures - continue Keppra 500 mg twice daily  Pregnancy - Beta hCG more than 2000 - Repeat ultrasound in 2 weeks recommended based on transabdominal ultrasound done on this admission which shows potential gestational sac  DVT prophylaxis: SCD's bilaterally  Code Status: full code  Family Communication: significant other at bedside  Disposition Plan: inpatient psych  when bed available   Consultants:   Psychiatry 6/5  Procedures:   None   Antimicrobials:   None   Subjective: No overnight events.  Objective: Filed Vitals:   07/11/15 1637 07/11/15 1959 07/12/15 0020 07/12/15 0601  BP: 121/63 107/64 100/56 104/62  Pulse: 85 92 97 84  Temp:  98.5 F (36.9 C) 98.7 F (37.1 C) 98.4 F (36.9 C)  TempSrc:  Oral Oral Oral  Resp: 21 20 25 18   Height:      Weight:      SpO2: 100%  100% 99%    Intake/Output Summary (Last 24 hours) at 07/12/15 1051 Last data filed at 07/12/15 0600  Gross per 24 hour  Intake   2583 ml  Output      0 ml  Net   2583 ml   Filed Weights   07/10/15 0800 07/11/15 0420  Weight: 76.34 kg (168 lb 4.8 oz) 78.654 kg (173 lb 6.4 oz)    Examination:  General exam: Appears anxious Respiratory system: Clear to auscultation. Respiratory effort normal. Cardiovascular system: S1 & S2 heard, RRR. No JVD, murmurs, rubs, gallops or clicks. No pedal edema. Gastrointestinal system: Abdomen is nondistended, soft and nontender. No organomegaly or masses felt. Normal bowel sounds heard. Central nervous system: Alert and oriented. No focal neurological deficits.  Data Reviewed: I have personally reviewed following labs and imaging studies  CBC:  Recent Labs Lab 07/10/15 0213 07/11/15 0634  WBC 10.3 10.9*  NEUTROABS 6.0 7.5  HGB 12.5 11.7*  HCT 38.9 36.1  MCV 86.6 85.7  PLT 202 [id number]   Basic Metabolic Panel:  Recent Labs Lab 07/10/15 0213 07/10/15 0948 07/10/15 1401 07/11/15 0437  NA 137 136 137 137  K 2.6* 3.9 3.8 4.6  CL 109 114* 113* 114*  CO2 19* 12* 17* 14*  GLUCOSE 97 90 77 93  BUN 8 5* <5* <5*  CREATININE 0.80 0.80 0.65 0.71  CALCIUM 8.8* 7.7* 7.8* 8.7*  MG 1.8  --   --  2.0   Liver Function Tests:  Recent Labs Lab 07/10/15 0213 07/11/15 0437  AST 20 34  ALT 15 17  ALKPHOS 47 46  BILITOT 0.4 1.1  PROT 6.1* 6.2*  ALBUMIN 3.5 3.7   Cardiac Enzymes:  Recent Labs Lab 07/10/15 0948    CKTOTAL 200   CBG:  Recent Labs Lab 07/10/15 0842  GLUCAP 104*   Urine analysis:    Component Value Date/Time   COLORURINE YELLOW 07/10/2015 0700   APPEARANCEUR CLEAR 07/10/2015 0700   LABSPEC 1.025 07/10/2015 0700   PHURINE 5.5 07/10/2015 0700   GLUCOSEU NEGATIVE 07/10/2015 0700   HGBUR NEGATIVE 07/10/2015 0700   BILIRUBINUR NEGATIVE 07/10/2015 0700   BILIRUBINUR negative 06/14/2015 0930   KETONESUR 15* 07/10/2015 0700   KETONESUR trace (5)* 06/14/2015 0930   PROTEINUR NEGATIVE 07/10/2015 0700   PROTEINUR negative 06/14/2015 0930   UROBILINOGEN 0.2 06/14/2015 0930   UROBILINOGEN 0.2 03/29/2014 1548   NITRITE NEGATIVE 07/10/2015 0700   NITRITE Negative 06/14/2015 0930   LEUKOCYTESUR NEGATIVE 07/10/2015 0700    Recent Results (from the past 240 hour(s))  MRSA PCR Screening     Status: None   Collection Time: 07/10/15  8:04 AM  Result Value Ref Range Status   MRSA by PCR NEGATIVE NEGATIVE Final      Radiology Studies: US Ob Comp Less 14 Wks 07/10/2015 5 mm cystic structure in the endometrial cavity could reflect an early gestational sac but is indeterminate on this transabdominal study. No adnexal mass or pelvic fluid. Recommend follow-up quantitative B-HCG levels and follow-up US in 14 days to confirm and assess viability. This recommendation follows SRU consensus guidelines:    Scheduled Meds: . cholecalciferol  800 Units Oral Daily  . docusate sodium  100 mg Oral Daily  . levETIRAcetam  500 mg Oral BID  . prenatal multivitamin  1 tablet Oral QHS  . senna  2 tablet Oral Daily  . sodium chloride flush  3 mL Intravenous Q12H   Continuous Infusions: . dextrose 5 % and 0.45 % NaCl with KCl 40 mEq/L 100 mL/hr at 07/11/15 1132     LOS: 2 days    Time spent: 25 minutes  Greater than 50% of the time spent on counseling and coordinating the care.   Faye Ramsay, MD [facility] Pager [phone]  If 7PM-7AM, please contact  night-coverage [email or web address] Password [email or web address] 07/12/2015, 10:51 AM

## 2015-07-12 NOTE — Progress Notes (Signed)
Pt stable for discharge to inpatient psych when bed available. Please notify me.  Faye Ramsay, MD  [facility] Pager [phone]  If 7PM-7AM, please contact night-coverage [email or web address] Password [email or web address]

## 2015-07-13 ENCOUNTER — Encounter (HOSPITAL_COMMUNITY): Payer: Self-pay

## 2015-07-13 ENCOUNTER — Other Ambulatory Visit: Payer: Medicare Other

## 2015-07-13 DIAGNOSIS — E872 Acidosis: Secondary | ICD-10-CM

## 2015-07-13 DIAGNOSIS — O0000 Abdominal pregnancy without intrauterine pregnancy: Secondary | ICD-10-CM

## 2015-07-13 LAB — CBC
HCT: 41.7 % (ref 36.0–46.0)
HEMOGLOBIN: 13.1 g/dL (ref 12.0–15.0)
MCH: 27.1 pg (ref 26.0–34.0)
MCHC: 31.4 g/dL (ref 30.0–36.0)
MCV: 86.3 fL (ref 78.0–100.0)
PLATELETS: 239 10*3/uL (ref 150–400)
RBC: 4.83 MIL/uL (ref 3.87–5.11)
RDW: 13.2 % (ref 11.5–15.5)
WBC: 11.9 10*3/uL — ABNORMAL HIGH (ref 4.0–10.5)

## 2015-07-13 LAB — BASIC METABOLIC PANEL
Anion gap: 11 (ref 5–15)
BUN: 15 mg/dL (ref 6–20)
CHLORIDE: 103 mmol/L (ref 101–111)
CO2: 21 mmol/L — ABNORMAL LOW (ref 22–32)
Calcium: 9.5 mg/dL (ref 8.9–10.3)
Creatinine, Ser: 0.79 mg/dL (ref 0.44–1.00)
GFR calc Af Amer: >60 mL/min (ref >60)
GFR calc non Af Amer: >60 mL/min (ref >60)
GLUCOSE: 96 mg/dL (ref 65–99)
POTASSIUM: 4 mmol/L (ref 3.5–5.1)
Sodium: 135 mmol/L (ref 135–145)

## 2015-07-13 MED ORDER — LEVETIRACETAM 500 MG PO TABS: 500.0000 mg | ORAL_TABLET | Freq: Two times a day (BID) | ORAL | Status: DC

## 2015-07-13 NOTE — Discharge Instructions (Signed)
Levetiracetam extended-release tablets °What is this medicine? °LEVETIRACETAM (lee ve tye RA se tam) is an antiepileptic drug. It is used with other medicines to treat certain types of seizures. °This medicine may be used for other purposes; ask your health care provider or pharmacist if you have questions. °What should I tell my health care provider before I take this medicine? °They need to know if you have any of these conditions: °-kidney disease °-suicidal thoughts, plans, or attempt; a previous suicide attempt by you or a family member °-an unusual or allergic reaction to levetiracetam, other medicines, foods, dyes, or preservatives °-pregnant or trying to get pregnant °-breast-feeding °How should I use this medicine? °Take this medicine by mouth with a glass of water. Follow the directions on the prescription label. Do not cut, crush or chew this medicine. You may take this medicine with or without food. Take your doses at regular intervals. Do not take your medicine more often than directed. Do not stop taking this medicine or any of your seizure medicines unless instructed by your doctor or health care professional. Stopping your medicine suddenly can increase your seizures or their severity. °A special MedGuide will be given to you by the pharmacist with each prescription and refill. Be sure to read this information carefully each time. °Contact your pediatrician or health care professional regarding the use of this medication in children. While this drug may be prescribed for children as young as 12 years of age for selected conditions, precautions do apply. °Overdosage: If you think you have taken too much of this medicine contact a poison control center or emergency room at once. °NOTE: This medicine is only for you. Do not share this medicine with others. °What if I miss a dose? °If you miss a dose and it has only been a few hours, take it as soon as you can. If it is almost time for your next dose,  take only that dose. Do not take double or extra doses. °What may interact with this medicine? °This medicine may interact with the following medications: °-carbamazepine °-colesevelam °-probenecid °-sevelamer °This list may not describe all possible interactions. Give your health care provider a list of all the medicines, herbs, non-prescription drugs, or dietary supplements you use. Also tell them if you smoke, drink alcohol, or use illegal drugs. Some items may interact with your medicine. °What should I watch for while using this medicine? °Visit your doctor or health care professional for a regular check on your progress. Wear a medical identification bracelet or chain to say you have epilepsy, and carry a card that lists all your medications. °It is important to take this medicine exactly as instructed by your health care professional. When first starting treatment, your dose may need to be adjusted. It may take weeks or months before your dose is stable. You should contact your doctor or health care professional if your seizures get worse or if you have any new types of seizures. °You may get drowsy or dizzy. Do not drive, use machinery, or do anything that needs mental alertness until you know how this medicine affects you. Do not stand or sit up quickly, especially if you are an older patient. This reduces the risk of dizzy or fainting spells. Alcohol may interfere with the effect of this medicine. Avoid alcoholic drinks. °The use of this medicine may increase the chance of suicidal thoughts or actions. Pay special attention to how you are responding while on this medicine. Any worsening of mood,   or thoughts of suicide or dying should be reported to your health care professional right away. °The tablet shell for some brands of this medicine does not dissolve. This is normal. The tablet shell may appear in the stool. This is not cause for concern. °Women who become pregnant while using this medicine may  enroll in the [place] Antiepileptic Drug Pregnancy Registry by calling 1-888-233-2334. This registry collects information about the safety of antiepileptic drug use during pregnancy. °What side effects may I notice from receiving this medicine? °Side effects you should report to your doctor or health care professional as soon as possible: °-allergic reactions like skin rash, itching or hives, swelling of the face, lips, or tongue °-breathing problems °-changes in emotions or moods °-dark urine °-general ill feeling or flu-like symptoms °-problems with balance, talking, walking °-suicidal thoughts or actions °-unusually weak or tired °-yellowing of the eyes or skin °Side effects that usually do not require medical attention (report to your doctor or health care professional if they continue or are bothersome): °-diarrhea °-dizzy, drowsy °-headache °-loss of appetite °This list may not describe all possible side effects. Call your doctor for medical advice about side effects. You may report side effects to FDA at 1-800-FDA-1088. °Where should I keep my medicine? °Keep out of reach of children. °Store at room temperature between 15 and 30 degrees C (59 and 86 degrees F). Throw away any unused medicine after the expiration date. °NOTE: This sheet is a summary. It may not cover all possible information. If you have questions about this medicine, talk to your doctor, pharmacist, or health care provider. °  °© 2016, [employer]. (2014-05-17 10:13:38) ° °

## 2015-07-13 NOTE — Care Management Note (Signed)
Case Management Note  Patient Details  Name: HAIVYN BUCHWALD MRN: [id number] Date of Birth: Aug 22, 1990  Subjective/Objective: 25 y.o. F admitted 07/10/2015 with Overdose of 50 Benadryl tablets, found to be HCG+ (early), and awaiting Inpatient Psych placement. CM referral received for follow up as pt is considered High risk pregnancy due to the medication she consumed prior to learning she was pregnant. When talking with her she reports her plans are to go to the [facility] as soon as she is discharged from the hospital to get prenatal care as she has Medicare and Medicaid. Encouraged to make them aware of this hospitalization when she seeks her prenatal care.                    Action/Plan:Disposition is deferred to the CSW as pt will be placed in Inpatient Psych at discharge. CM will sign off for now but will be available should additional needs arise.    Expected Discharge Date:                  Expected Discharge Plan:  Psychiatric Hospital  In-House Referral:  Clinical Social Work  Discharge planning Services  CM Consult  Post Acute Care Choice:    Choice offered to:     DME Arranged:    DME Agency:     HH Arranged:    [place] Agency:     Status of Service:  In process, will continue to follow  Medicare Important Message Given:  Yes Date Medicare IM Given:    Medicare IM give by:    Date Additional Medicare IM Given:    Additional Medicare Important Message give by:     If discussed at [place] of Stay Meetings, dates discussed:    Additional Comments:  Delrae Sawyers, RN 07/13/2015, 10:14 AM

## 2015-07-13 NOTE — Progress Notes (Signed)
Report given to Manuel Garcia, RN on 5W. Patient transferred via wheelchair with husband and belongings. Patient will be in 5W19. CCMD notified of transfer. Patient will not be on Telemetry. Called [facility] to inform them of patient's transfer as well.  Milford Cage, RN

## 2015-07-13 NOTE — Progress Notes (Signed)
Attempted Report. Charge nurse getting patient. Will call back.

## 2015-07-13 NOTE — Progress Notes (Signed)
Called back Shelda Pal for report on Patient

## 2015-07-13 NOTE — Discharge Summary (Addendum)
Physician Discharge Summary  [name] [id number] DOB: 1990/04/20 DOA: 07/10/2015  PCP: No primary care provider on file.  Admit date: 07/10/2015 Discharge date: 07/13/2015  Recommendations for Outpatient Follow-up:  Because patient is pregnant  at the time of this admission we recommending avoiding all medications other than what's needed for asthma control which is albuterol inhaler and Flovent and Keppra for seizures .  Discharge Diagnoses:  Principal Problem:   Schizoaffective disorder, bipolar type ([place]) Active Problems:   Suicidal ideations   Anticholinergic drug overdose   History of seizure   Pregnancy   Diphenhydramine overdose of undetermined intent    Discharge Condition: stable   Diet recommendation: as tolerated   History of present illness:  25 y.o. woman with a history of schizoaffective disorder, depression, seizure, prior suicidal ideation who reportedly called EMS herself after taking at least 60 of 50mg  benadryl tablets in suicidal attempt.  Patient was hemodynamically stable on the admission. She had prolonged QT interval on EKG, sinus tachycardia. Poison control was contacted by EMS personnel. Recommendation was for aggressive hydration and benzodiazepine as needed for agitation. Beta hCG was positive. Transvaginal ultrasound was not completed because of patient noncompliance distress abdominal ultrasound shows seems to be gestational sac although too early to determine.  Patient has been seen by psychiatry in consultation and they recommend inpatient behavioral management.  Hospital Course:   Assessment & Plan:  Principal problem: Suicidal attempt by overdosing on Benadryl - Patient took at least 60 tablets of 50 mg Benadryl - Urine drug screen positive for benzodiazepines otherwise negative - Alcohol was within normal limits and salicylate level was within normal limits - Tylenol was within normal limits - Psychiatry consulted, inpatient psych  recommended  - Sitter at bedside  Active Problems: Metabolic acidosis - Likely from overdosing on Benadryl - Renal function within normal limits  History of seizure disorder - No reports of seizures - Continue Keppra 500 mg twice daily - Valproic acid was not ordered on admission and she should take it because of pregnancy   Pregnancy - Beta hCG more than 2000 - Repeat ultrasound in 2 weeks recommended based on transabdominal ultrasound done on this admission which shows potential gestational sac  DVT prophylaxis: SCD's bilaterally in hospital  Code Status: full code  Family Communication: pt husband at bedside   Consultants:   Psychiatry 6/5  Procedures:   None  Antimicrobials:   None   Signed:  Leisa Lenz, MD  [facility] 07/13/2015, 9:21 AM  Pager #: [phone]  Time spent in minutes: less than 30 minutes   Discharge Exam: Filed Vitals:   07/13/15 0312 07/13/15 0734  BP: 100/76 101/62  Pulse: 91 84  Temp: 98.1 F (36.7 C) 98 F (36.7 C)  Resp: 19 18   Filed Vitals:   07/12/15 2249 07/13/15 0027 07/13/15 0312 07/13/15 0734  BP:  97/70 100/76 101/62  Pulse:  110 91 84  Temp:  98.3 F (36.8 C) 98.1 F (36.7 C) 98 F (36.7 C)  TempSrc:  Oral Oral Oral  Resp:  17 19 18   Height:      Weight:      SpO2: 99% 98% 96% 97%    General: Pt is alert, follows commands appropriately, not in acute distress Cardiovascular: Regular rate and rhythm, S1/S2 +, no murmurs Respiratory: Clear to auscultation bilaterally, no wheezing, no crackles, no rhonchi Abdominal: Soft, non tender, non distended, bowel sounds +, no guarding Extremities: no edema, no cyanosis, pulses palpable bilaterally  DP and PT Neuro: Grossly nonfocal  Discharge Instructions  Discharge Instructions    Call MD for:  difficulty breathing, headache or visual disturbances    Complete by:  As directed      Call MD for:  persistant dizziness or light-headedness    Complete by:   As directed      Call MD for:  persistant nausea and vomiting    Complete by:  As directed      Call MD for:  severe uncontrolled pain    Complete by:  As directed      Diet - low sodium heart healthy    Complete by:  As directed      Discharge instructions    Complete by:  As directed   Because patient is pregnant  at the time of this admission we recommending avoiding all medications other than what's needed for asthma control which is albuterol inhaler and Flovent and Keppra for seizures .     Increase activity slowly    Complete by:  As directed             Medication List    STOP taking these medications        ARIPiprazole 30 MG tablet  Commonly known as:  ABILIFY     benzonatate 100 MG capsule  Commonly known as:  TESSALON     busPIRone 10 MG tablet  Commonly known as:  BUSPAR     diphenhydrAMINE 25 MG tablet  Commonly known as:  BENADRYL     divalproex 500 MG 24 hr tablet  Commonly known as:  DEPAKOTE ER     docusate sodium 100 MG capsule  Commonly known as:  COLACE     DULCOLAX PO     Guaifenesin 1200 MG Tb12  Commonly known as:  MUCINEX MAXIMUM STRENGTH     ibuprofen 200 MG tablet  Commonly known as:  ADVIL,MOTRIN     ibuprofen 800 MG tablet  Commonly known as:  ADVIL,MOTRIN     meloxicam 15 MG tablet  Commonly known as:  MOBIC     OVER THE COUNTER MEDICATION     prazosin 1 MG capsule  Commonly known as:  MINIPRESS     prazosin 2 MG capsule  Commonly known as:  MINIPRESS     senna 8.6 MG Tabs tablet  Commonly known as:  SENOKOT      TAKE these medications        albuterol 108 (90 Base) MCG/ACT inhaler  Commonly known as:  PROVENTIL HFA;VENTOLIN HFA  Inhale 1-2 puffs into the lungs every 6 (six) hours as needed for wheezing or shortness of breath. Reported on 05/05/2015     fluticasone 220 MCG/ACT inhaler  Commonly known as:  FLOVENT HFA  Inhale 1 puff into the lungs 2 (two) times daily.     levETIRAcetam 500 MG tablet  Commonly  known as:  KEPPRA  Take 1 tablet (500 mg total) by mouth 2 (two) times daily.     prenatal multivitamin Tabs tablet  Take 1 tablet by mouth at bedtime.     VITAMIN D-400 400 units Tabs tablet  Generic drug:  cholecalciferol  Take 400 Units by mouth at bedtime.          The results of significant diagnostics from this hospitalization (including imaging, microbiology, ancillary and laboratory) are listed below for reference.    Significant Diagnostic Studies: Dg Chest 2 View  06/14/2015  CLINICAL DATA:  Cough and congestion for  4 days EXAM: CHEST  2 VIEW COMPARISON:  None. FINDINGS: The heart size and mediastinal contours are within normal limits. Both lungs are clear. The visualized skeletal structures are unremarkable. IMPRESSION: No active cardiopulmonary disease. Electronically Signed   By: Inez Catalina M.D.   On: 06/14/2015 09:55   US Ob Comp Less 14 Wks  07/10/2015  CLINICAL DATA:  Pelvic pain in first-trimester pregnancy. Quantitative beta HCG 7,400. EXAM: OBSTETRIC <14 WK ULTRASOUND TECHNIQUE: Transabdominal ultrasound was performed for evaluation of the gestation as well as the maternal uterus and adnexal regions. COMPARISON:  05/10/2015 FINDINGS: No definitive intra or extrauterine gestational sac is identified. A 5 mm cystic structure is noted in the endometrial cavity. Intra-ovarian cystic structure on the right consistent with follicular process. No adnexal mass or hemoperitoneum. Consent for transvaginal examination could not be obtained from patient. IMPRESSION: 5 mm cystic structure in the endometrial cavity could reflect an early gestational sac but is indeterminate on this transabdominal study. No adnexal mass or pelvic fluid. Recommend follow-up quantitative B-HCG levels and follow-up US in 14 days to confirm and assess viability. This recommendation follows SRU consensus guidelines: Diagnostic Criteria for Nonviable Pregnancy Early in the First Trimester. [place] Med 2013HT:[id number]. Electronically Signed   By: Monte Fantasia M.D.   On: 07/10/2015 05:37    Microbiology: Recent Results (from the past 240 hour(s))  MRSA PCR Screening     Status: None   Collection Time: 07/10/15  8:04 AM  Result Value Ref Range Status   MRSA by PCR NEGATIVE NEGATIVE Final    Comment:        The GeneXpert MRSA Assay (FDA approved for NASAL specimens only), is one component of a comprehensive MRSA colonization surveillance program. It is not intended to diagnose MRSA infection nor to guide or monitor treatment for MRSA infections.      Labs: Basic Metabolic Panel:  Recent Labs Lab 07/10/15 0213 07/10/15 0948 07/10/15 1401 07/11/15 0437 07/12/15 1025 07/13/15 0422  NA 137 136 137 137 136 135  K 2.6* 3.9 3.8 4.6 4.6 4.0  CL 109 114* 113* 114* 105 103  CO2 19* 12* 17* 14* 20* 21*  GLUCOSE 97 90 77 93 91 96  BUN 8 5* <5* <5* 7 15  CREATININE 0.80 0.80 0.65 0.71 0.73 0.79  CALCIUM 8.8* 7.7* 7.8* 8.7* 9.6 9.5  MG 1.8  --   --  2.0  --   --    Liver Function Tests:  Recent Labs Lab 07/10/15 0213 07/11/15 0437  AST 20 34  ALT 15 17  ALKPHOS 47 46  BILITOT 0.4 1.1  PROT 6.1* 6.2*  ALBUMIN 3.5 3.7   No results for input(s): LIPASE, AMYLASE in the last 168 hours. No results for input(s): AMMONIA in the last 168 hours. CBC:  Recent Labs Lab 07/10/15 0213 07/11/15 0634 07/13/15 0422  WBC 10.3 10.9* 11.9*  NEUTROABS 6.0 7.5  --   HGB 12.5 11.7* 13.1  HCT 38.9 36.1 41.7  MCV 86.6 85.7 86.3  PLT 202 215 239   Cardiac Enzymes:  Recent Labs Lab 07/10/15 0948  CKTOTAL 200   BNP: BNP (last 3 results) No results for input(s): BNP in the last 8760 hours.  ProBNP (last 3 results) No results for input(s): PROBNP in the last 8760 hours.  CBG:  Recent Labs Lab 07/10/15 0842  GLUCAP 104*
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
## 2015-07-13 NOTE — Progress Notes (Signed)
   07/13/15 1500  Clinical Encounter Type  Visited With Patient  Visit Type Initial;Psychological support;Spiritual support;Social support  Referral From Nurse  Spiritual Encounters  Spiritual Needs Emotional  Stress Factors  Patient Stress Factors Family relationships;Financial concerns;Health changes;Major life changes  Family Stress Factors Family relationships;Financial concerns   Chaplain visited with pt. Chaplain provided emotional support to pt. Pt very stressed about situation with pregnancy, family dynamics, loss of job.  Chaplain advised pt to try and focus on her health and not stress about situations she has no control over.  Chaplain provided ministry of care for patient and will follow up with the pt as needed.  [name] [name]  07/13/2015  3:56 PM  [id number]

## 2015-07-14 ENCOUNTER — Encounter (HOSPITAL_COMMUNITY): Payer: Self-pay | Admitting: *Deleted

## 2015-07-14 DIAGNOSIS — F250 Schizoaffective disorder, bipolar type: Secondary | ICD-10-CM

## 2015-07-14 MED ORDER — DOCUSATE SODIUM 100 MG PO CAPS
200.0000 mg | ORAL_CAPSULE | Freq: Every day | ORAL | Status: DC
  Administered 2015-07-14: 200 mg via ORAL
  Filled 2015-07-14: qty 2

## 2015-07-14 MED ORDER — PHENOL 1.4 % MT LIQD: 1.0000 | OROMUCOSAL | Status: DC | PRN

## 2015-07-14 NOTE — Progress Notes (Signed)
Patient seen and examined at the bedside. Patient is medically stable for discharge to behavioral health Hospital once bed available. Please refer to discharge summary completed 07/13/2015. No changes in medical management since 07/13/2015.  Leisa Lenz [facility] [id number]

## 2015-07-14 NOTE — Progress Notes (Signed)
   07/14/15 1500  Clinical Encounter Type  Visited With Patient;Health care provider  Visit Type Follow-up;Spiritual support;Social support  Referral From Patient  Spiritual Encounters  Spiritual Needs Emotional  Stress Factors  Patient Stress Factors Exhausted;Family relationships;Financial concerns;Health changes;Loss of control;Major life changes  Family Stress Factors Not reviewed   Chaplain visited with pt today.  She was frustrated and agitated. Pt complained of dizziness and blood in stool. Chaplain advised nurse as to medical issues with pt.  Sitter was present in the room during visit.  Pt concerned with spouse and being separated from him.  Pt as concerned about when she would be discharged from hospital to be move to behavioral health.  Chaplain provided ministry of presence to pt while she shared her frustrations.  Chaplain spoke to pt about plan of action after she is discharged.  Pt is looking forward to baby and starting new life with spouse.  Chaplain noticed pt concerned about family dynamics and strained relationship with parents.  Pt wants to improve her relationships.  Chaplain advised pt she would visit with her tomorrow.  Vilinda Blanks Majesta Leichter  07/14/2015  4:00 PM  [id number]

## 2015-07-14 NOTE — Progress Notes (Signed)
MEDICATION RELATED CONSULT NOTE - INITIAL   Indication: Assessment of medications in 1st Trimester pregnancy  Allergies  Allergen Reactions  . Bee Venom Anaphylaxis  . Coconut Flavor Anaphylaxis  . Geodon [Ziprasidone Hcl] Other (See Comments)    Paralysis of mouth  . Haloperidol And Related Other (See Comments)    Paralysis of mouth   . Lithium Other (See Comments)    Seizure like muscle spasms   . Oxycodone Other (See Comments)    hallucinations   . Seroquel [Quetiapine Fumarate] Other (See Comments)    TOO STRONG  . Prilosec [Omeprazole] Nausea And Vomiting  . Milk-Related Compounds Nausea And Vomiting    Patient can eat dairy but can not drink "straight milk".  Marland Kitchen Percocet [Oxycodone-Acetaminophen] Other (See Comments)    Causes hallucinations.  . Shellfish Allergy Swelling  . Tegretol [Carbamazepine] Nausea And Vomiting      Medical History: Past Medical History  Diagnosis Date  . Asthma   . Depression   . Schizoaffective disorder, bipolar type ([place])   . Autism   . Essential tremor   . Chronic constipation   . Acid reflux   . Drug-seeking behavior   . Seizures ([facility])     Medications:  Scheduled:  . cholecalciferol  800 Units Oral Daily  . docusate sodium  200 mg Oral Daily  . levETIRAcetam  500 mg Oral BID  . prenatal multivitamin  1 tablet Oral QHS  . senna  2 tablet Oral Daily  . sodium chloride flush  3 mL Intravenous Q12H    Assessment: I have looked up currently ordered medications in this pt and those on her Med Rec that have not been resumed at this time.  The Pregnancy Category Risk Factor is noted, if available, or assessment based on reading.    Current meds: APAP- ok Albuterol- C (some anomalies but pts on mult meds in most cases) VitD-  OK in deficient female Docusate- ok, hypoMg in newborn has been seen with chronic use Keppra- C (preferred, d/w OB at time of admission) Senna- OK, though chronic use not recommended  Home meds not  ordered: Depakote ER- avoid, changed to [place] at time of admission Guaifenesin- ok Aripiprazole-   C Bisacodyl- Long term use should be avoided Buspirone- B Benadryl- B (pt was using qHS, but then overdosed on this medication) Flonase- C, intranasal steroids recommended for rhinitis at lowest effective dose OK Ibuprofen- OK during first trimester.Do not use beyond 31wks pregnancy Prazosin- C   Recommendations: 1- Recommend checking Vit D level in this pt, and reducing dose as chronic Vit D > that dose in Prenatal Vitamin may lead to Vit D deficiency in the newborn. 2-  Discuss options for recommended treatment of chronic constipation with Obstetrician.   Gracy Bruins, PharmD Clinical Pharmacist [facility]
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
## 2015-07-15 ENCOUNTER — Encounter (HOSPITAL_COMMUNITY): Payer: Self-pay | Admitting: Psychiatry

## 2015-07-15 ENCOUNTER — Inpatient Hospital Stay
Admission: RE | Admit: 2015-07-15 | Discharge: 2015-07-18 | DRG: 885 | Disposition: A | Discharge status: Home / Self Care | Payer: Medicare Other | Source: Intra-hospital | Attending: Psychiatry | Admitting: Psychiatry

## 2015-07-15 DIAGNOSIS — K219 Gastro-esophageal reflux disease without esophagitis: Secondary | ICD-10-CM | POA: Diagnosis present

## 2015-07-15 DIAGNOSIS — Z9889 Other specified postprocedural states: Secondary | ICD-10-CM | POA: Diagnosis not present

## 2015-07-15 DIAGNOSIS — R45851 Suicidal ideations: Secondary | ICD-10-CM | POA: Diagnosis present

## 2015-07-15 DIAGNOSIS — F250 Schizoaffective disorder, bipolar type: Principal | ICD-10-CM | POA: Diagnosis present

## 2015-07-15 DIAGNOSIS — F603 Borderline personality disorder: Secondary | ICD-10-CM | POA: Diagnosis present

## 2015-07-15 DIAGNOSIS — Z3490 Encounter for supervision of normal pregnancy, unspecified, unspecified trimester: Secondary | ICD-10-CM

## 2015-07-15 DIAGNOSIS — G40909 Epilepsy, unspecified, not intractable, without status epilepticus: Secondary | ICD-10-CM | POA: Diagnosis present

## 2015-07-15 DIAGNOSIS — F17210 Nicotine dependence, cigarettes, uncomplicated: Secondary | ICD-10-CM | POA: Diagnosis present

## 2015-07-15 DIAGNOSIS — Z888 Allergy status to other drugs, medicaments and biological substances status: Secondary | ICD-10-CM | POA: Diagnosis not present

## 2015-07-15 DIAGNOSIS — J45909 Unspecified asthma, uncomplicated: Secondary | ICD-10-CM | POA: Diagnosis present

## 2015-07-15 DIAGNOSIS — Z62810 Personal history of physical and sexual abuse in childhood: Secondary | ICD-10-CM | POA: Diagnosis present

## 2015-07-15 DIAGNOSIS — F4310 Post-traumatic stress disorder, unspecified: Secondary | ICD-10-CM | POA: Diagnosis present

## 2015-07-15 DIAGNOSIS — F845 Asperger's syndrome: Secondary | ICD-10-CM | POA: Diagnosis present

## 2015-07-15 DIAGNOSIS — T450X2A Poisoning by antiallergic and antiemetic drugs, intentional self-harm, initial encounter: Secondary | ICD-10-CM | POA: Diagnosis present

## 2015-07-15 DIAGNOSIS — Z3A01 Less than 8 weeks gestation of pregnancy: Secondary | ICD-10-CM

## 2015-07-15 DIAGNOSIS — R569 Unspecified convulsions: Secondary | ICD-10-CM

## 2015-07-15 DIAGNOSIS — Z915 Personal history of self-harm: Secondary | ICD-10-CM

## 2015-07-15 DIAGNOSIS — Z885 Allergy status to narcotic agent status: Secondary | ICD-10-CM

## 2015-07-15 DIAGNOSIS — Z825 Family history of asthma and other chronic lower respiratory diseases: Secondary | ICD-10-CM

## 2015-07-15 DIAGNOSIS — Z818 Family history of other mental and behavioral disorders: Secondary | ICD-10-CM

## 2015-07-15 DIAGNOSIS — Z79899 Other long term (current) drug therapy: Secondary | ICD-10-CM

## 2015-07-15 DIAGNOSIS — E559 Vitamin D deficiency, unspecified: Secondary | ICD-10-CM | POA: Diagnosis present

## 2015-07-15 MED ORDER — METHYLPREDNISOLONE SODIUM SUCC 40 MG IJ SOLR
20.0000 mg | Freq: Once | INTRAMUSCULAR | Status: DC
  Filled 2015-07-15: qty 1

## 2015-07-15 MED ORDER — PRENATAL MULTIVITAMIN CH
1.0000 | ORAL_TABLET | Freq: Every day | ORAL | Status: DC
Start: 2015-07-15 — End: 2015-07-18
  Administered 2015-07-15 – 2015-07-17 (×3): 1 via ORAL
  Filled 2015-07-15 (×7): qty 1

## 2015-07-15 MED ORDER — PHENOL 1.4 % MT LIQD
1.0000 | OROMUCOSAL | Status: DC | PRN
Start: 2015-07-15 — End: 2015-07-18
  Filled 2015-07-15: qty 177

## 2015-07-15 MED ORDER — LEVETIRACETAM 500 MG PO TABS
500.0000 mg | ORAL_TABLET | Freq: Two times a day (BID) | ORAL | Status: DC
  Administered 2015-07-15 – 2015-07-18 (×6): 500 mg via ORAL
  Filled 2015-07-15 (×8): qty 1

## 2015-07-15 MED ORDER — ACETAMINOPHEN 325 MG PO TABS
650.0000 mg | ORAL_TABLET | Freq: Four times a day (QID) | ORAL | Status: DC | PRN
  Administered 2015-07-16 (×2): 650 mg via ORAL
  Filled 2015-07-15 (×2): qty 2

## 2015-07-15 MED ORDER — PHENOL 1.4 % MT LIQD: 1.0000 | OROMUCOSAL | Status: DC | PRN

## 2015-07-15 MED ORDER — ALBUTEROL SULFATE HFA 108 (90 BASE) MCG/ACT IN AERS
1.0000 | INHALATION_SPRAY | Freq: Four times a day (QID) | RESPIRATORY_TRACT | Status: DC | PRN
  Filled 2015-07-15: qty 6.7

## 2015-07-15 MED ORDER — ALUM & MAG HYDROXIDE-SIMETH 200-200-20 MG/5ML PO SUSP: 30.0000 mL | ORAL | Status: DC | PRN

## 2015-07-15 MED ORDER — SENNA 8.6 MG PO TABS
2.0000 | ORAL_TABLET | Freq: Every day | ORAL | Status: DC
  Administered 2015-07-15 – 2015-07-17 (×3): 17.2 mg via ORAL
  Filled 2015-07-15 (×3): qty 2

## 2015-07-15 MED ORDER — MAGNESIUM HYDROXIDE 400 MG/5ML PO SUSP: 30.0000 mL | Freq: Every day | ORAL | Status: DC | PRN

## 2015-07-15 MED ORDER — MENTHOL 3 MG MT LOZG
1.0000 | LOZENGE | OROMUCOSAL | Status: DC | PRN
  Filled 2015-07-15: qty 9

## 2015-07-15 MED ORDER — CHOLECALCIFEROL 10 MCG (400 UNIT) PO TABS
800.0000 [IU] | ORAL_TABLET | Freq: Every day | ORAL | Status: DC
Start: 2015-07-16 — End: 2015-07-18
  Administered 2015-07-16 – 2015-07-18 (×3): 800 [IU] via ORAL
  Filled 2015-07-15 (×3): qty 2

## 2015-07-15 MED ORDER — ALBUTEROL SULFATE (2.5 MG/3ML) 0.083% IN NEBU
2.5000 mg | INHALATION_SOLUTION | Freq: Four times a day (QID) | RESPIRATORY_TRACT | Status: DC | PRN
  Filled 2015-07-15 (×2): qty 3

## 2015-07-15 MED ORDER — PREDNISONE 50 MG PO TABS
50.0000 mg | ORAL_TABLET | Freq: Once | ORAL | Status: AC
  Administered 2015-07-15: 50 mg via ORAL
  Filled 2015-07-15: qty 1

## 2015-07-15 NOTE — Clinical Social Work Psych Note (Signed)
Psych CSW spoke with intake at [facility] regarding possible admission today.  Intake coordinator to contact Jonte re: signing the voluntary form for patient.  [id number].    Unit RN can be reached at [phone] to coordinate transfer.  Patient will discharge today per MD order. Patient will discharge to: [facility] Transportation: [facility] P[facility] [id number]- RN to call for transportation after [facility] gives bed assignment information  CSW sent discharge summary to SNF for review.  Packet is complete.  RN, patient and family aware of discharge plans.  Nonnie Done, LCSW [phone]  5N1-9, 2S 15-16 and Psychiatric Service Line  Licensed Clinical Social Worker

## 2015-07-15 NOTE — Progress Notes (Signed)
Pt angry that her Colace was discontinued for safety reasons pertaining to her pregnancy. Patient threw her carton of milk against the wall out into the hall. RN trying to desculate situation, MD made aware. Will continue to monitor.

## 2015-07-15 NOTE — Care Management Important Message (Signed)
Important Message  Patient Details  Name: [name] MRN: [id number] Date of Birth: [birth year]   Medicare Important Message Given:  Yes    Fleur Audino Abena 07/15/2015, 11:19 AM

## 2015-07-15 NOTE — Progress Notes (Signed)
Patient discharged to [facility], with transport by way of wheelchair.

## 2015-07-15 NOTE — Progress Notes (Signed)
   07/15/15 1100  Clinical Encounter Type  Visited With Patient  Visit Type Follow-up;Spiritual support;Social support  Referral From Patient  Spiritual Encounters  Spiritual Needs Emotional  Stress Factors  Patient Stress Factors Exhausted;Family relationships;Financial concerns;Health changes;Lack of knowledge;Loss of control;Major life changes  Family Stress Factors Family relationships;Major life changes   Chaplain visited with pt this morning. Pt is very stressed and agitated this morning. PT threw milk carton across the room.  Pt concerned about being in hospital for extended period and then having to be sent to behavioral health for another extended period of time.  Pt concerned with time away from new spouse and his condition. Pt concerned about her dog and her pregnancy.  Chaplain advised pt to try and decrease her stress level by concentrating on her baby and her plan of action when released from [place] spoke with CSW and was informed that a bed at [facility] may have been found for pt. Pt not told about the update yet.  Chaplain will follow up with pt this afternoon to see if she has been able to destress.  [name] [name]  07/15/2015  11:59 AM  [id number]

## 2015-07-15 NOTE — Progress Notes (Addendum)
Called report  [phone] to [facility], Junita Push RN. Pt assigned to room 310.

## 2015-07-15 NOTE — Progress Notes (Signed)
Transportation: [facility]y [facility] [phone]- RN to call for transportation after [facility] gives bed assignment information  This RN called for Transportation. Pt going to bed [place]

## 2015-07-15 NOTE — Clinical Social Work Note (Signed)
Psych CSW was notified that [facility] is re-reviewing the patient for possible admission today.  Charna Archer, guardian will complete voluntary form.  Psych CSW contacted guardian for completion of paperwork.  Psych CSW awaiting a return call.  Nonnie Done, LCSW [phone]  5N 24-32 and [place] Licensed Clinical Social Worker

## 2015-07-15 NOTE — Progress Notes (Signed)
Patient pleasant and cooperative during admission assessment. Patient denies SI/HI at this time. Patient denies AVH. Patient informed of fall risk status, fall risk assessed "low" at this time. Patient oriented to unit/staff/room. Patient denies any questions/concerns at this time. Patient safe on unit with Q15 minute checks for safety. Skin assessment & body search done ,no contraband found.

## 2015-07-15 NOTE — Progress Notes (Signed)
Notified by psych CSW that patient has been accepted by [facility] bed 310.  [name] LCSWA [phone]

## 2015-07-15 NOTE — Progress Notes (Addendum)
Patient ID: [name], female   DOB: 10/22/1990, 25 y.o.   MRN: [id number]  PROGRESS NOTE    [name]  [id number] DOB: Feb 25, 1990 DOA: 07/10/2015  PCP: No primary care provider on file.   Brief Narrative:  25 y.o. woman with a history of schizoaffective disorder, depression, seizure, prior suicidal ideation who reportedly called EMS herself after taking at least 60 of 50mg  benadryl tablets in suicidal attempt.  Patient was hemodynamically stable on the admission. She had prolonged QT interval on EKG, sinus tachycardia. Poison control was contacted by EMS personnel. Recommendation was for aggressive hydration and benzodiazepine as needed for agitation. Beta hCG was positive. Transvaginal ultrasound was not completed because of patient noncompliance distress abdominal ultrasound shows seems to be gestational sac although too early to determine.  Patient has been seen by psychiatry in consultation and they recommend inpatient behavioral management.   Assessment & Plan:  Principal problem: Suicidal attempt by overdosing on Benadryl - Patient took at least 60 tablets of 50 mg Benadryl - Urine drug screen positive for benzodiazepines otherwise negative - Alcohol was within normal limits and salicylate level was within normal limits - Tylenol was within normal limits - Psychiatry consulted, inpatient psych recommended, awaiting placement - Sitter at bedside  Active Problems: Constipation - Advised patient on not taking Colace because of pregnancy. She insists on taking Colace. Very frustrated that Colace was stopped.  Metabolic acidosis - Likely from overdosing on Benadryl - Renal function within normal limits  History of seizure disorder - No reports of seizures - Continue Keppra 500 mg twice daily - Valproic acid was not ordered on admission and she should take it because of pregnancy   Pregnancy - Beta hCG more than 2000 - Repeat ultrasound in 2 weeks recommended  based on transabdominal ultrasound done on this admission which shows potential gestational sac (due for repeat 6/28)  Sore throat / swollen tonsils - Will give Cipro: Lozenges - Tonsils slightly swollen but no exudates, no erythema visualized - She feels that she can't swallow however had no problems taking by mouth Keppra. We'll give 1 dose of prednisone by mouth 50 mg and see there is any improvement.   DVT prophylaxis: SCD's bilaterally in hospital  Code Status: full code  Family Communication: pt husband at bedside  Disposition Plan: To behavioral health once bed available  Consultants:   Psychiatry 6/5  Procedures:   None  Antimicrobials:   None    Subjective: No overnight events.  Objective: Filed Vitals:   07/13/15 1847 07/14/15 0629 07/14/15 2208 07/15/15 0515  BP: 114/67 109/59 111/52 102/47  Pulse: 99 85 83 82  Temp: 98.2 F (36.8 C) 98.2 F (36.8 C) 98.2 F (36.8 C) 98.5 F (36.9 C)  TempSrc: Oral Oral    Resp: 18 17 18 18   Height: 5\' 4"  (1.626 m)     Weight: 76.204 kg (168 lb)     SpO2: 100% 99% 99% 99%    Intake/Output Summary (Last 24 hours) at 07/15/15 1113 Last data filed at 07/15/15 0900  Gross per 24 hour  Intake   1020 ml  Output      0 ml  Net   1020 ml   Filed Weights   07/10/15 0800 07/11/15 0420 07/13/15 1847  Weight: 76.34 kg (168 lb 4.8 oz) 78.654 kg (173 lb 6.4 oz) 76.204 kg (168 lb)    Examination:  General exam: Appears calm and comfortable  Respiratory system: Clear to auscultation. Respiratory effort  normal. Cardiovascular system: S1 & S2 heard, RRR. No JVD, murmurs, rubs, gallops or clicks. No pedal edema. Gastrointestinal system: Abdomen is nondistended, soft and nontender. No organomegaly or masses felt. Normal bowel sounds heard. Central nervous system: Alert and oriented. No focal neurological deficits. Extremities: Symmetric 5 x 5 power. Skin: No rashes, lesions or ulcers Psychiatry: Judgement and insight  appear normal. Mood & affect appropriate.   Data Reviewed: I have personally reviewed following labs and imaging studies  CBC:  Recent Labs Lab 07/10/15 0213 07/11/15 0634 07/13/15 0422  WBC 10.3 10.9* 11.9*  NEUTROABS 6.0 7.5  --   HGB 12.5 11.7* 13.1  HCT 38.9 36.1 41.7  MCV 86.6 85.7 86.3  PLT 202 215 [id number]   Basic Metabolic Panel:  Recent Labs Lab 07/10/15 0213 07/10/15 0948 07/10/15 1401 07/11/15 0437 07/12/15 1025 07/13/15 0422  NA 137 136 137 137 136 135  K 2.6* 3.9 3.8 4.6 4.6 4.0  CL 109 114* 113* 114* 105 103  CO2 19* 12* 17* 14* 20* 21*  GLUCOSE 97 90 77 93 91 96  BUN 8 5* <5* <5* 7 15  CREATININE 0.80 0.80 0.65 0.71 0.73 0.79  CALCIUM 8.8* 7.7* 7.8* 8.7* 9.6 9.5  MG 1.8  --   --  2.0  --   --    GFR: Estimated Creatinine Clearance: 106.5 mL/min (by C-G formula based on Cr of 0.79). Liver Function Tests:  Recent Labs Lab 07/10/15 0213 07/11/15 0437  AST 20 34  ALT 15 17  ALKPHOS 47 46  BILITOT 0.4 1.1  PROT 6.1* 6.2*  ALBUMIN 3.5 3.7   No results for input(s): LIPASE, AMYLASE in the last 168 hours. No results for input(s): AMMONIA in the last 168 hours. Coagulation Profile: No results for input(s): INR, PROTIME in the last 168 hours. Cardiac Enzymes:  Recent Labs Lab 07/10/15 0948  CKTOTAL 200   BNP (last 3 results) No results for input(s): PROBNP in the last 8760 hours. HbA1C: No results for input(s): HGBA1C in the last 72 hours. CBG:  Recent Labs Lab 07/10/15 0842  GLUCAP 104*   Lipid Profile: No results for input(s): CHOL, HDL, LDLCALC, TRIG, CHOLHDL, LDLDIRECT in the last 72 hours. Thyroid Function Tests: No results for input(s): TSH, T4TOTAL, FREET4, T3FREE, THYROIDAB in the last 72 hours. Anemia Panel: No results for input(s): VITAMINB12, FOLATE, FERRITIN, TIBC, IRON, RETICCTPCT in the last 72 hours. Urine analysis:    Component Value Date/Time   COLORURINE YELLOW 07/10/2015 0700   APPEARANCEUR CLEAR 07/10/2015 0700     LABSPEC 1.025 07/10/2015 0700   PHURINE 5.5 07/10/2015 0700   GLUCOSEU NEGATIVE 07/10/2015 0700   HGBUR NEGATIVE 07/10/2015 0700   BILIRUBINUR NEGATIVE 07/10/2015 0700   BILIRUBINUR negative 06/14/2015 0930   KETONESUR 15* 07/10/2015 0700   KETONESUR trace (5)* 06/14/2015 0930   PROTEINUR NEGATIVE 07/10/2015 0700   PROTEINUR negative 06/14/2015 0930   UROBILINOGEN 0.2 06/14/2015 0930   UROBILINOGEN 0.2 03/29/2014 1548   NITRITE NEGATIVE 07/10/2015 0700   NITRITE Negative 06/14/2015 0930   LEUKOCYTESUR NEGATIVE 07/10/2015 0700   Sepsis Labs: @LABRCNTIP (procalcitonin:4,lacticidven:4)   Recent Results (from the past 240 hour(s))  MRSA PCR Screening     Status: None   Collection Time: 07/10/15  8:04 AM  Result Value Ref Range Status   MRSA by PCR NEGATIVE NEGATIVE Final    Comment:        The GeneXpert MRSA Assay (FDA approved for NASAL specimens only), is one component of a comprehensive  MRSA colonization surveillance program. It is not intended to diagnose MRSA infection nor to guide or monitor treatment for MRSA infections.       Radiology Studies:  US Ob Comp Less 14 Wks 07/23/15  CLINICAL DATA:  Pelvic pain in first-trimester pregnancy. Quantitative beta HCG 7,400. EXAM: OBSTETRIC <14 WK ULTRASOUND TECHNIQUE: Transabdominal ultrasound was performed for evaluation of the gestation as well as the maternal uterus and adnexal regions. COMPARISON:  05/10/2015 FINDINGS: No definitive intra or extrauterine gestational sac is identified. A 5 mm cystic structure is noted in the endometrial cavity. Intra-ovarian cystic structure on the right consistent with follicular process. No adnexal mass or hemoperitoneum. Consent for transvaginal examination could not be obtained from patient. IMPRESSION: 5 mm cystic structure in the endometrial cavity could reflect an early gestational sac but is indeterminate on this transabdominal study. No adnexal mass or pelvic fluid. Recommend  follow-up quantitative B-HCG levels and follow-up US in 14 days to confirm and assess viability. This recommendation follows SRU consensus guidelines: Diagnostic Criteria for Nonviable Pregnancy Early in the First Trimester. [employer] Med [id number]. Electronically Signed   By: Monte Fantasia M.D.   On: 2015/07/23 05:37       Scheduled Meds: . cholecalciferol  800 Units Oral Daily  . levETIRAcetam  500 mg Oral BID  . methylPREDNISolone (SOLU-MEDROL) injection  20 mg Intravenous Once  . predniSONE  50 mg Oral Once  . prenatal multivitamin  1 tablet Oral QHS  . senna  2 tablet Oral Daily  . sodium chloride flush  3 mL Intravenous Q12H   Continuous Infusions:    LOS: 5 days    Time spent: 15 minutes  Greater than 50% of the time spent on counseling and coordinating the care.   Leisa Lenz, MD [facility] Pager [phone]  If 7PM-7AM, please contact night-coverage [email or web address] Password [email or web address] 07/15/2015, 11:13 AM

## 2015-07-15 NOTE — BH Assessment (Signed)
Patient has been accepted to [facility].  Accepting physician is Dr. Jerilee Hoh.  Attending Physician will be Dr. Alden Hipp  Patient has been assigned to room 310, by [place].  Call report to [phone].  Representative/Transfer Coordinator is Control and instrumentation engineer (Lenna Sciara, Nurse & Barnett Applebaum, Education officer, museum) made aware of acceptance.

## 2015-07-15 NOTE — Progress Notes (Signed)
If bed available, please refer to discharge summary completed 07/13/2015 No changes in medical management since 6/7  Leisa Lenz [facility] [phone]

## 2015-07-15 NOTE — BH Assessment (Signed)
Patient's Guardian Tommie Ard [phone]) signed volunteer admission form for the patient and is in agreement with her being inpatient. Copy was placed on patient's paper chart.

## 2015-07-16 ENCOUNTER — Encounter: Payer: Self-pay | Admitting: Psychiatry

## 2015-07-16 LAB — LIPID PANEL
CHOL/HDL RATIO: 3.7 ratio
CHOLESTEROL: 165 mg/dL (ref 0–200)
HDL: 45 mg/dL (ref >40)
LDL Cholesterol: 99 mg/dL (ref 0–99)
TRIGLYCERIDES: 105 mg/dL (ref <150)
VLDL: 21 mg/dL (ref 0–40)

## 2015-07-16 LAB — HEMOGLOBIN A1C: Hgb A1c MFr Bld: 5 % (ref 4.0–6.0)

## 2015-07-16 MED ORDER — BUDESONIDE 0.5 MG/2ML IN SUSP
0.5000 mg | Freq: Two times a day (BID) | RESPIRATORY_TRACT | Status: DC
  Administered 2015-07-16 – 2015-07-18 (×5): 0.5 mg via RESPIRATORY_TRACT
  Filled 2015-07-16 (×7): qty 2

## 2015-07-16 MED ORDER — LURASIDONE HCL 40 MG PO TABS
40.0000 mg | ORAL_TABLET | Freq: Every day | ORAL | Status: DC
  Administered 2015-07-16 – 2015-07-17 (×2): 40 mg via ORAL
  Filled 2015-07-16 (×2): qty 1

## 2015-07-16 NOTE — B[facility] Group Notes (Signed)
[place] LCSW Group Therapy  07/16/2015 2:46 PM  Type of Therapy: Group Therapy  Participation Level: Minimal  Participation Quality: Attentive  Affect: Appropriate  Cognitive: Alert  Insight: Limited  Engagement in Therapy: Limited  Modes of Intervention: Discussion, Education, Socialization and Support  Summary of Progress/Problems:. Pt will identify unhealthy thoughts and how they impact their emotions and behavior. Pt will be encouraged to discuss these thoughts, emotions and behaviors with the group. Pt attended group and stayed the entire time. She was preoccupied with discharge. She states "I just want to go home and raise my baby and be normal."   [place] MSW, LCSWA  07/16/2015, 2:46 PM

## 2015-07-16 NOTE — [facility] Group Notes (Signed)
[place] Group Notes:  (Nursing/MHT/Case Management/Adjunct)  Date:  07/16/2015  Time:  5:13 AM  Type of Therapy:  Psychoeducational Skills  Participation Level:  Active  Participation Quality:  Appropriate  Affect:  Appropriate  Cognitive:  Appropriate  Insight:  Appropriate  Engagement in Group:  Engaged  Modes of Intervention:  Discussion, Socialization and Support  Summary of Progress/Problems:  Reece Agar 07/16/2015, 5:13 AM
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
## 2015-07-16 NOTE — Tx Team (Signed)
Initial Interdisciplinary Treatment Plan   PATIENT STRESSORS: Financial difficulties Marital or family conflict   PATIENT STRENGTHS: Capable of independent living General fund of knowledge Physical Health   PROBLEM LIST: Problem List/Patient Goals Date to be addressed Date deferred Reason deferred Estimated date of resolution  SI 07-15-15     Depression 07-15-15     Anxiety 07-15-15     AH 07-15-15           "I want to somewhere else where they have more groups"      " I can't take any  meds for the voices while I am pregnant bc it isn't safe. I will have to deal with them".                   DISCHARGE CRITERIA:  Improved stabilization in mood, thinking, and/or behavior  PRELIMINARY DISCHARGE PLAN: Outpatient therapy Return to previous living arrangement  PATIENT/FAMIILY INVOLVEMENT: This treatment plan has been presented to and reviewed with the patient, [name], and/or family member, .  The patient and family have been given the opportunity to ask questions and make suggestions.  [name] 07/16/2015, 1:29 AM

## 2015-07-16 NOTE — Progress Notes (Signed)
D: Pt anxious and speech rapid. Intrusive and at Nurses station frequently repeating same requests multiple time. Little patience. Writer had to go to Pt room to process with pt when she was wailing in her room. Pt able to process and calm down. States " This is the worst place I have ever been. I want to go where there are more groups if I have to be committed. I don't like sitting on my ass all day. I want to be discharge on Monday at 0659. I really want to go home and not be here"  A: Encouragement and support provided. Medications given as prescribed. Q15 minute checks maintained for safety R: Remains safe on unit. Requires multiple redirections. Intrusive. Will continue to monitor.

## 2015-07-16 NOTE — Progress Notes (Signed)
D:  Per pt self inventory pt reports sleeping good, appetite fair, energy level high, ability to pay attention good, rates depression at a 2 out of 10, hopelessness at a 0 out of 10, anxiety at a 3 out of 10, denies SI/HI/AVH, goal today: "I want to start [place] for my baby and I and get stable to return home as soon as possible, hopefully Monday", pleasant, anxious during interaction, rapid speech, appropriate.     A:  Emotional support provided, Encouraged pt to continue with treatment plan and attend all group activities, q15 min checks maintained for safety.  R:  Pt is receptive, going to groups, pleasant and cooperative with staff and other patients on the unit, states that she feels motivated to take care of her baby and get well and stay well for her baby.
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
## 2015-07-17 LAB — PROLACTIN: PROLACTIN: 13 ng/mL (ref 4.8–23.3)

## 2015-07-17 NOTE — Progress Notes (Signed)
Pt affect anxious. Speech rapid. Med compliant. Focused on getting discharged Monday. Minimizes need to be here. Denies any SI/AVH. Voices no additional concerns. Slept 9.45. Will continue to monitor.
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
## 2015-07-17 NOTE — Progress Notes (Signed)
Pt has been pleasant and cooperative.. Pt denies SI and A/V hallucinations . Pt continues to be active on the unit interacting with both staff and peers appropriately.Pt's mood and affect has been depressed.

## 2015-07-17 NOTE — BHH Group Notes (Signed)
[facility] Group Notes:  (Nursing/MHT/Case Management/Adjunct)  Date:  07/17/2015  Time:  5:23 AM  Type of Therapy:  Psychoeducational Skills  Participation Level:  Active  Participation Quality:  Appropriate  Affect:  Appropriate  Cognitive:  Appropriate  Insight:  Appropriate and Good  Engagement in Group:  Engaged  Modes of Intervention:  Discussion, Socialization and Support  Summary of Progress/Problems:  [name] 07/17/2015, 5:23 AM

## 2015-07-17 NOTE — BHH Group Notes (Signed)
[place] LCSW Group Therapy  07/17/2015 2:03 PM  Type of Therapy:  Group Therapy  Participation Level:  Active   Participation Quality:  Attentive  Affect:  Flat  Cognitive:  Alert  Insight:  Limited  Engagement in Therapy:  Limited  Modes of Intervention:  Discussion, Education, Socialization and Support  Summary of Progress/Problems: Self esteem: Patients discussed self esteem and how it impacts them. They discussed what aspects in their lives has influenced their self esteem. They were challenged to identify changes that are needed in order to improve self esteem.  Pt attended group and stayed the entire time. Pt was very focused on wanting to discharge tomorrow.   [place] MSW, [place]  07/17/2015, 2:03 PM

## 2015-07-17 NOTE — Progress Notes (Signed)
  Pt has been pleasant and cooperative thus far on shift. Pt denies SI and A/V hallucinations . Pt continues to be active on the unit interacting with both staff and peers appropriately.Pt's mood and affect has been bright. She reports being happy about her pregnancy and new husband. No behavioral issues to report on shift at this time.

## 2015-07-18 ENCOUNTER — Encounter: Payer: Self-pay | Admitting: Psychiatry

## 2015-07-18 DIAGNOSIS — E559 Vitamin D deficiency, unspecified: Secondary | ICD-10-CM

## 2015-07-18 DIAGNOSIS — F250 Schizoaffective disorder, bipolar type: Principal | ICD-10-CM

## 2015-07-18 MED ORDER — LEVETIRACETAM 500 MG PO TABS: 500.0000 mg | ORAL_TABLET | Freq: Two times a day (BID) | ORAL | Status: DC

## 2015-07-18 MED ORDER — PRENATAL MULTIVITAMIN CH: 1.0000 | ORAL_TABLET | Freq: Every day | ORAL | Status: DC

## 2015-07-18 MED ORDER — LURASIDONE HCL 40 MG PO TABS: 40.0000 mg | ORAL_TABLET | Freq: Every day | ORAL | Status: DC

## 2015-07-18 NOTE — Tx Team (Signed)
Pt was admitted after the previous tx team mtg that the CSW was present for (on 07/15/15) and before the next scheduled tx meeting (on 07/20/15) and as such, a tx team note was not needed.    [name]. [name], LCSWA, LCAS  612/[phone]

## 2015-07-18 NOTE — Progress Notes (Signed)
Patient denies SI/HI, denies A/V hallucinations. Patient verbalizes understanding of discharge instructions, follow up care and prescriptions. Patient given all belongings from  locker. Patient escorted out by staff, transported by family. 

## 2015-07-18 NOTE — BHH Group Notes (Signed)
[facility] Group Notes:  (Nursing/MHT/Case Management/Adjunct)  Date:  07/18/2015  Time:  12:09 AM  Type of Therapy:  Group Therapy  Participation Level:  Active  Participation Quality:  Attentive and Intrusive  Affect:  Excited  Cognitive:  Alert  Insight:  Appropriate  Engagement in Group:  Engaged  Modes of Intervention:  Discussion  Summary of Progress/Problems: Pt would mumble words that staff could not understand while fellow pt's shared their goal. Each time staff was able to redirect the pt.  Pt spoke very rapidly when speaking of her goal to go home. Pt stated that she would never try to harm herself again.   [name] 07/18/2015, 12:09 AM

## 2015-07-18 NOTE — BHH Suicide Risk Assessment (Signed)
[facility] INPATIENT:  Family/Significant Other Suicide Prevention Education  Suicide Prevention Education:  Patient Refusal for Family/Significant Other Suicide Prevention Education: The patient [name] has refused to provide written consent for family/significant other to be provided Family/Significant Other Suicide Prevention Education during admission and/or prior to discharge.  Physician notified. Pt refused SPE from the CSW.  Alphonse Guild Sidni Fusco 07/18/2015, 1:29 PM

## 2015-07-18 NOTE — Progress Notes (Signed)
  [facility] Adult Case Management Discharge Plan :  Will you be returning to the same living situation after discharge:  Yes,  pt will be returning to her home in [place] At discharge, do you have transportation home?: Yes,  pt will be picked up by her family Do you have the ability to pay for your medications: Yes,  pt will be provided with prescriptions at discharge.  Release of information consent forms completed and in the chart;  Patient's signature needed at discharge.  Patient to Follow up at: Follow-up Information    Follow up with CONSTANT,PEGGY, MD.   Specialty:  Obstetrics and Gynecology   Why:  f/u high risk pregnancy July 3rd 9:20 am [place] information:   [place] [address] [phone]       Follow up with [facility].   Why:  Please arrive to the walk-in clinic Monday through Friday from 9-4pm for your assessment for your hospital follow up for medication management and therapy. Please call [place] at discharge at ph: [phone]   Contact information:   [facility] [address] [place], [place] [address] Phone: [phone] Fax: [phone]      Follow up with [facility] [place].   Why:  Your legal guardian Wyonia Hough will contact you the day after discharge to facilitate your discharge home to the community   Contact information:   [facility] [place] [address] [place], [place] [address] Phone: [phone] Fax: [phone]      Next level of care provider has access to [place] and Suicide Prevention discussed: No. Pt refused SPE from the [place].  Have you used any form of tobacco in the last 30 days? (Cigarettes, Smokeless Tobacco, Cigars, and/or Pipes): No  Has patient been referred to the Quitline?: N/A patient is not a smoker  Patient has been referred for addiction treatment: N/A  Alphonse Guild Milburn Freeney 07/18/2015, 3:01  PM
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
## 2015-07-18 NOTE — Progress Notes (Signed)
Recreation Therapy Notes  Date: 06.12.17 Time: 9:30 am Location: Craft Room  Group Topic: Self-expression  Goal Area(s) Addresses:  Patient will effectively use art as a means of self-expression. Patient will be able to identify one emotion experienced during group session.  Behavioral Response: Intermittently Attentive, Interactive, Disruptive  Intervention: Two Faces of Me  Activity: Patients were given a blank worksheet and instructed to draw a line down the middle. On one side of the face, patients were instructed to draw or write how they were feeling when they were admitted to the hospital. On the other side of the face, patients were instructed to draw or write how they want to feel when they are d/c.  Education: LRT educated group on different forms on self-expression.  Education Outcome: In group clarification offered  Clinical Observations/Feedback: Patient was disruptive at the beginning of group. When LRT was explaining the topic, patient would say things like, "Who even cares." Patient would mutter to herself. Patient laid her head on the table. LRT informed patient if she did not want to participate in group she could go back to her room. Patient stayed and completed activity by writing and drawing how she felt when she was admitted and how she wanted to feel when she is d/c. Patient contributed to group discussion by stating how her faces were different, what she had learned to help her with positive change, what her plain is to maintain the difference, and what emotions she felt during group. Patient would mutter to herself and talk over LRT and peers.   Leonette Monarch, LRT/CTRS 07/18/2015 10:06 AM

## 2015-07-19 ENCOUNTER — Telehealth: Payer: Self-pay | Admitting: Neurology

## 2015-07-19 NOTE — Telephone Encounter (Signed)
LMOM advising EEG is safe during pregnancy.  She may call back with any other questions/fim

## 2015-07-19 NOTE — Telephone Encounter (Signed)
Patient called to inquire if EEG is safe during pregnancy. Please call to advise [phone].

## 2015-07-20 ENCOUNTER — Ambulatory Visit (INDEPENDENT_AMBULATORY_CARE_PROVIDER_SITE_OTHER): Payer: Medicare Other | Admitting: Family Medicine

## 2015-07-20 VITALS — BP 102/62 | HR 85 | Temp 98.1°F | Resp 16 | Ht 64.0 in | Wt 164.8 lb

## 2015-07-20 DIAGNOSIS — O0001 Abdominal pregnancy with intrauterine pregnancy: Secondary | ICD-10-CM

## 2015-07-20 DIAGNOSIS — J4540 Moderate persistent asthma, uncomplicated: Secondary | ICD-10-CM

## 2015-07-20 MED ORDER — ALBUTEROL SULFATE HFA 108 (90 BASE) MCG/ACT IN AERS: 1.0000 | INHALATION_SPRAY | Freq: Four times a day (QID) | RESPIRATORY_TRACT | Status: DC | PRN

## 2015-07-20 MED ORDER — FLUTICASONE-SALMETEROL 115-21 MCG/ACT IN AERO: 2.0000 | INHALATION_SPRAY | Freq: Two times a day (BID) | RESPIRATORY_TRACT | Status: DC

## 2015-07-20 NOTE — Patient Instructions (Addendum)
     IF you received an x-ray today, you will receive an invoice from [facility]. Please contact [facility] at [phone] with questions or concerns regarding your invoice.   IF you received labwork today, you will receive an invoice from [employer]. Please contact [facility] at [phone] with questions or concerns regarding your invoice.   Our billing staff will not be able to assist you with questions regarding bills from these companies.  You will be contacted with the lab results as soon as they are available. The fastest way to get your results is to activate your My Chart account. Instructions are located on the last page of this paperwork. If you have not heard from [name] regarding the results in 2 weeks, please contact this office.    As we discussed, the asthma medications are not providing sufficient control AT THIS TIME. Will change Flovent to Advair, still 2 puffs twice per day. Albuterol as needed for breakthrough symptoms. Recheck in the next 2 weeks to determine your asthma control, and if not improving at that point, may need evaluation with pulmonary specialist.  Keep follow-up with neurology and appointment with primary care provider in next few months. However we do need to see you in follow-up in the next few weeks to see how your asthma is doing.  Return to the clinic or go to the nearest emergency room if any of your symptoms worsen or new symptoms occur.

## 2015-07-20 NOTE — Progress Notes (Signed)
Subjective:  By signing my name below, I, Moises Blood, attest that this documentation has been prepared under the direction and in the presence of Merri Ray, MD. Electronically Signed: Moises Blood, [place]. 07/20/2015 , 8:43 AM .  Patient was seen in Room 10 .   Patient ID: [name], female    DOB: 10-21-1990, 25 y.o.   MRN: [id number] Chief Complaint  Patient presents with  . Medication Refill    Ventolin, flovent   HPI [name] is a 25 y.o. female Patient has h/o asthma, schizoaffective disorder, and seizures. Currently pregnant, here for asthma medication refill. She was just admitted 10 days ago after suspected suicide attempt. She's treated by inpatient behavioral health. Discharged June 12th. Determined that she's 5-[redacted] weeks pregnant. Plan for evaluation and appointment with [name] for seizures tomorrow, was changed to keppra from depakote while hospitalized. She was referred to obstetrician high risk clinic. Has appointment with OBGYN July 3rd.   Patient was last seen by her PCP in Oct at [facility]. She reports her last asthma attack was a few nights ago; she felt hot and wheezy. She notes using her albuterol 2 times a day and about 2-4 times a week. She also uses them at night when she has night time symptoms. She was diagnosed with asthma in the ED in March. She denies seeing a pulmonologist.   Patient Active Problem List   Diagnosis Date Noted  . Vitamin D deficiency 07/18/2015  . Pregnancy 07/10/2015  . Asthma 04/15/2015  . Convulsion ([place]) 04/12/2015  . Schizoaffective disorder, bipolar type by history(HCC) 03/10/2014  . Borderline personality disorder 03/10/2014  . PTSD (post-traumatic stress disorder) 03/10/2014   Past Medical History  Diagnosis Date  . Asthma   . Depression   . Schizoaffective disorder, bipolar type ([place])   . Autism   . Essential tremor   . Chronic constipation   . Acid reflux   . Drug-seeking behavior   .  Seizures [facility])    Past Surgical History  Procedure Laterality Date  . Mouth surgery     Allergies  Allergen Reactions  . Bee Venom Anaphylaxis  . Coconut Flavor Anaphylaxis  . Geodon [Ziprasidone Hcl] Other (See Comments)    Paralysis of mouth  . Haloperidol And Related Other (See Comments)    Paralysis of mouth   . Lithium Other (See Comments)    Seizure like muscle spasms   . Oxycodone Other (See Comments)    hallucinations   . Seroquel [Quetiapine Fumarate] Other (See Comments)    TOO STRONG  . Prilosec [Omeprazole] Nausea And Vomiting  . Percocet [Oxycodone-Acetaminophen] Other (See Comments)    Causes hallucinations.  . Shellfish Allergy Swelling  . Tegretol [Carbamazepine] Nausea And Vomiting   Prior to Admission medications   Medication Sig Start Date End Date Taking? Authorizing Provider  albuterol (PROVENTIL HFA;VENTOLIN HFA) 108 (90 Base) MCG/ACT inhaler Inhale 1-2 puffs into the lungs every 6 (six) hours as needed for wheezing or shortness of breath. Reported on 05/05/2015   Yes Historical Provider, MD  cholecalciferol (VITAMIN D-400) 400 UNITS TABS tablet Take 400 Units by mouth at bedtime.    Yes Historical Provider, MD  fluticasone (FLOVENT HFA) 220 MCG/ACT inhaler Inhale 1 puff into the lungs 2 (two) times daily.    Yes Historical Provider, MD  levETIRAcetam (KEPPRA) 500 MG tablet Take 1 tablet (500 mg total) by mouth 2 (two) times daily. 07/18/15  Yes Hildred Priest, MD  lurasidone (LATUDA)  40 MG TABS tablet Take 1 tablet (40 mg total) by mouth daily with supper. 07/18/15  Yes Hildred Priest, MD  Prenatal Vit-Fe Fumarate-FA (PRENATAL MULTIVITAMIN) TABS tablet Take 1 tablet by mouth at bedtime. 07/18/15  Yes Hildred Priest, MD   Social History   Social History  . Marital Status: Single    Spouse Name: N/A  . Number of Children: 0  . Years of Education: N/A   Occupational History  . disability    Social History Main Topics    . Smoking status: Former Smoker -- 0.00 packs/day  . Smokeless tobacco: Not on file     Comment: Smoked for 2  years age [age]  . Alcohol Use: 0.6 oz/week    1 Standard drinks or equivalent per week  . Drug Use: No     Comment: History of cocaine use at age 77 for 4 months  . Sexual Activity: Yes    Birth Control/ Protection: None   Other Topics Concern  . Not on file   Social History Narrative   Marital status: married; guardian is Furniture conservator/restorer      Children: none      Lives: with husband in apartment      Employment:  Disability      Tobacco: quit smoking; smoked for two years.      Alcohol ;none      Drugs: none           Review of Systems  Constitutional: Negative for fever, chills and fatigue.  Respiratory: Negative for cough, shortness of breath and wheezing.   Gastrointestinal: Negative for nausea, vomiting, diarrhea and constipation.  Neurological: Negative for dizziness, weakness and light-headedness.       Objective:   Physical Exam  Constitutional: She is oriented to person, place, and time. She appears well-developed and well-nourished. No distress.  HENT:  Head: Normocephalic and atraumatic.  Eyes: EOM are normal. Pupils are equal, round, and reactive to light.  Neck: Neck supple.  Cardiovascular: Normal rate.   Pulmonary/Chest: Effort normal and breath sounds normal. No respiratory distress. She has no wheezes.  Musculoskeletal: Normal range of motion.  Neurological: She is alert and oriented to person, place, and time.  Skin: Skin is warm and dry.  Psychiatric: She has a normal mood and affect. Her behavior is normal.  Nursing note and vitals reviewed.   Filed Vitals:   07/20/15 0818  BP: 102/62  Pulse: 85  Temp: 98.1 F (36.7 C)  TempSrc: Oral  Resp: 16  Height: 5\' 4"  (1.626 m)  Weight: 164 lb 12.8 oz (74.753 kg)  SpO2: 99%   Peak flow: 400 Predicted peak flow: approximately 440    Assessment & Plan:   [name] is a 25 y.o.  female Moderate persistent asthma, uncomplicated - Plan: fluticasone-salmeterol (ADVAIR HFA) 115-21 MCG/ACT inhaler, albuterol (PROVENTIL HFA;VENTOLIN HFA) 108 (90 Base) MCG/ACT inhaler  Abdominal pregnancy with intrauterine pregnancy  Recent diagnosis of pregnancy, history of seizure disorder, asthma, recent hospitalization as above. Lungs were clear today, minimally decreased peak flow, but based on her frequent use of albuterol, appears to be moderate persistent asthma with decrease control.  -Change Flovent to Advair HFA, continue albuterol as needed for breakthrough symptoms. Risks of these medications were discussed with pregnancy, but discussed that untreated asthma may be greater risk. Specifically appears that the salmeterol in Advair may be more an issue near time of delivery. Can discuss these medications further with her OB/GYN.  -Keep appointment with neurologist  tomorrow and PCP next few months. Recheck asthma symptoms in the next few weeks. Sooner if worse.   Meds ordered this encounter  Medications  . fluticasone-salmeterol (ADVAIR HFA) 115-21 MCG/ACT inhaler    Sig: Inhale 2 puffs into the lungs 2 (two) times daily.    Dispense:  1 Inhaler    Refill:  3  . albuterol (PROVENTIL HFA;VENTOLIN HFA) 108 (90 Base) MCG/ACT inhaler    Sig: Inhale 1-2 puffs into the lungs every 6 (six) hours as needed for wheezing or shortness of breath. Reported on 05/05/2015    Dispense:  1 Inhaler    Refill:  1   Patient Instructions       IF you received an x-ray today, you will receive an invoice from [facility]. Please contact [facility] at [phone] with questions or concerns regarding your invoice.   IF you received labwork today, you will receive an invoice from [employer]. Please contact [facility] at [phone] with questions or concerns regarding your invoice.   Our billing staff will not be able to assist you with questions regarding  bills from these companies.  You will be contacted with the lab results as soon as they are available. The fastest way to get your results is to activate your My Chart account. Instructions are located on the last page of this paperwork. If you have not heard from [name] regarding the results in 2 weeks, please contact this office.    As we discussed, the asthma medications are not providing sufficient control AT THIS TIME. Will change Flovent to Advair, still 2 puffs twice per day. Albuterol as needed for breakthrough symptoms. Recheck in the next 2 weeks to determine your asthma control, and if not improving at that point, may need evaluation with pulmonary specialist.  Keep follow-up with neurology and appointment with primary care provider in next few months. However we do need to see you in follow-up in the next few weeks to see how your asthma is doing.  Return to the clinic or go to the nearest emergency room if any of your symptoms worsen or new symptoms occur.     I personally performed the services described in this documentation, which was scribed in my presence. The recorded information has been reviewed and considered, and addended by me as needed.   Signed,   Merri Ray, MD [facility].  07/20/2015 8:54 AM

## 2015-07-21 ENCOUNTER — Ambulatory Visit (INDEPENDENT_AMBULATORY_CARE_PROVIDER_SITE_OTHER): Payer: Medicare Other | Admitting: Neurology

## 2015-07-21 DIAGNOSIS — R569 Unspecified convulsions: Secondary | ICD-10-CM

## 2015-07-21 DIAGNOSIS — G43709 Chronic migraine without aura, not intractable, without status migrainosus: Secondary | ICD-10-CM

## 2015-07-21 NOTE — Procedures (Signed)
   HISTORY: 25 year old female presented with seizure-like activity, history of frequent headaches, head injury in the past.  TECHNIQUE:  16 channel EEG was performed based on standard 10-16 international system. One channel was dedicated to EKG, which has demonstrates normal sinus rhythm of 66 beats per minutes.  Upon awakening, the posterior background activity was well-developed, in alpha range, reactive to eye opening and closure. There was frequent bilateral frontal dominant small amplitude beta range activity.  This is commonly seen patient taking benzodiazepine  There was no evidence of epileptiform discharge.  Photic stimulation was not performed.  Hyperventilation was performed, there was no abnormality elicit.  No sleep was achieved.  CONCLUSION: This is a  Normal awake EEG.  There is no electrodiagnostic evidence of epileptiform discharge. The increased appearance of beta activity is commonly associated with benzodiazepine use.

## 2015-07-22 ENCOUNTER — Inpatient Hospital Stay (HOSPITAL_COMMUNITY)
Admission: AD | Admit: 2015-07-22 | Discharge: 2015-07-22 | Disposition: A | Discharge status: Home / Self Care | Payer: Medicare Other | Source: Ambulatory Visit | Attending: Obstetrics & Gynecology | Admitting: Obstetrics & Gynecology

## 2015-07-22 ENCOUNTER — Encounter (HOSPITAL_COMMUNITY): Payer: Self-pay | Admitting: *Deleted

## 2015-07-22 DIAGNOSIS — J45909 Unspecified asthma, uncomplicated: Secondary | ICD-10-CM | POA: Insufficient documentation

## 2015-07-22 DIAGNOSIS — Z3A01 Less than 8 weeks gestation of pregnancy: Secondary | ICD-10-CM | POA: Insufficient documentation

## 2015-07-22 DIAGNOSIS — O99511 Diseases of the respiratory system complicating pregnancy, first trimester: Secondary | ICD-10-CM | POA: Insufficient documentation

## 2015-07-22 DIAGNOSIS — Z87891 Personal history of nicotine dependence: Secondary | ICD-10-CM | POA: Diagnosis not present

## 2015-07-22 DIAGNOSIS — O210 Mild hyperemesis gravidarum: Secondary | ICD-10-CM | POA: Insufficient documentation

## 2015-07-22 DIAGNOSIS — O219 Vomiting of pregnancy, unspecified: Secondary | ICD-10-CM

## 2015-07-22 LAB — COMPREHENSIVE METABOLIC PANEL
ALBUMIN: 4.1 g/dL (ref 3.5–5.0)
ALT: 17 U/L (ref 14–54)
ANION GAP: 9 (ref 5–15)
AST: 16 U/L (ref 15–41)
Alkaline Phosphatase: 43 U/L (ref 38–126)
BILIRUBIN TOTAL: 0.6 mg/dL (ref 0.3–1.2)
BUN: 7 mg/dL (ref 6–20)
CHLORIDE: 104 mmol/L (ref 101–111)
CO2: 22 mmol/L (ref 22–32)
Calcium: 9 mg/dL (ref 8.9–10.3)
Creatinine, Ser: 0.61 mg/dL (ref 0.44–1.00)
GFR calc Af Amer: >60 mL/min (ref >60)
GFR calc non Af Amer: >60 mL/min (ref >60)
Glucose, Bld: 84 mg/dL (ref 65–99)
POTASSIUM: 3.8 mmol/L (ref 3.5–5.1)
SODIUM: 135 mmol/L (ref 135–145)
TOTAL PROTEIN: 6.8 g/dL (ref 6.5–8.1)

## 2015-07-22 LAB — CBC
HEMATOCRIT: 36.7 % (ref 36.0–46.0)
Hemoglobin: 12.2 g/dL (ref 12.0–15.0)
MCH: 28.2 pg (ref 26.0–34.0)
MCHC: 33.2 g/dL (ref 30.0–36.0)
MCV: 84.8 fL (ref 78.0–100.0)
Platelets: 231 10*3/uL (ref 150–400)
RBC: 4.33 MIL/uL (ref 3.87–5.11)
RDW: 13.1 % (ref 11.5–15.5)
WBC: 13.6 10*3/uL — AB (ref 4.0–10.5)

## 2015-07-22 LAB — URINALYSIS, ROUTINE W REFLEX MICROSCOPIC
BILIRUBIN URINE: NEGATIVE
Glucose, UA: NEGATIVE mg/dL
Hgb urine dipstick: NEGATIVE
Ketones, ur: 15 mg/dL — AB
Leukocytes, UA: NEGATIVE
NITRITE: NEGATIVE
PROTEIN: NEGATIVE mg/dL
SPECIFIC GRAVITY, URINE: 1.025 (ref 1.005–1.030)
pH: 6 (ref 5.0–8.0)

## 2015-07-22 LAB — POCT PREGNANCY, URINE: PREG TEST UR: POSITIVE — AB

## 2015-07-22 MED ORDER — LACTATED RINGERS IV BOLUS (SEPSIS)
1000.0000 mL | Freq: Once | INTRAVENOUS | Status: AC
  Administered 2015-07-22: 1000 mL via INTRAVENOUS

## 2015-07-22 MED ORDER — METOCLOPRAMIDE HCL 10 MG PO TABS: 10.0000 mg | ORAL_TABLET | Freq: Four times a day (QID) | ORAL | Status: DC | PRN

## 2015-07-22 MED ORDER — METOCLOPRAMIDE HCL 5 MG/ML IJ SOLN
10.0000 mg | Freq: Once | INTRAMUSCULAR | Status: AC
  Administered 2015-07-22: 10 mg via INTRAVENOUS
  Filled 2015-07-22: qty 2

## 2015-07-22 NOTE — MAU Note (Signed)
Pt C/O vomiting for the past 3 days, unable to keep anything down but water.  Denies pain, bleeding or diarrhea.

## 2015-07-22 NOTE — Discharge Instructions (Signed)
Morning Sickness Morning sickness is when you feel sick to your stomach (nauseous) during pregnancy. This nauseous feeling may or may not come with vomiting. It often occurs in the morning but can be a problem any time of day. Morning sickness is most common during the first trimester, but it may continue throughout pregnancy. While morning sickness is unpleasant, it is usually harmless unless you develop severe and continual vomiting (hyperemesis gravidarum). This condition requires more intense treatment.  CAUSES  The cause of morning sickness is not completely known but seems to be related to normal hormonal changes that occur in pregnancy. RISK FACTORS You are at greater risk if you:  Experienced nausea or vomiting before your pregnancy.  Had morning sickness during a previous pregnancy.  Are pregnant with more than one baby, such as twins. TREATMENT  Do not use any medicines (prescription, over-the-counter, or herbal) for morning sickness without first talking to your health care provider. Your health care provider may prescribe or recommend:  Vitamin B6 supplements.  Anti-nausea medicines.  The herbal medicine ginger. HOME CARE INSTRUCTIONS   Only take over-the-counter or prescription medicines as directed by your health care provider.  Taking multivitamins before getting pregnant can prevent or decrease the severity of morning sickness in most women.  Eat a piece of dry toast or unsalted crackers before getting out of bed in the morning.  Eat five or six small meals a day.  Eat dry and bland foods (rice, baked potato). Foods high in carbohydrates are often helpful.  Do not drink liquids with your meals. Drink liquids between meals.  Avoid greasy, fatty, and spicy foods.  Get someone to cook for you if the smell of any food causes nausea and vomiting.  If you feel nauseous after taking prenatal vitamins, take the vitamins at night or with a snack.  Snack on protein  foods (nuts, yogurt, cheese) between meals if you are hungry.  Eat unsweetened gelatins for desserts.  Wearing an acupressure wristband (worn for sea sickness) may be helpful.  Acupuncture may be helpful.  Do not smoke.  Get a humidifier to keep the air in your house free of odors.  Get plenty of fresh air. SEEK MEDICAL CARE IF:   Your home remedies are not working, and you need medicine.  You feel dizzy or lightheaded.  You are losing weight. SEEK IMMEDIATE MEDICAL CARE IF:   You have persistent and uncontrolled nausea and vomiting.  You pass out (faint). MAKE SURE YOU:  Understand these instructions.  Will watch your condition.  Will get help right away if you are not doing well or get worse.   This information is not intended to replace advice given to you by your health care provider. Make sure you discuss any questions you have with your health care provider.   Document Released: 03/15/2006 Document Revised: 01/27/2013 Document Reviewed: 07/09/2012 Elsevier Interactive Patient Education 2016 [place] Medications in Pregnancy   Acne: Benzoyl Peroxide Salicylic Acid  Backache/Headache: Tylenol: 2 regular strength every 4 hours OR              2 Extra strength every 6 hours  Colds/Coughs/Allergies: Benadryl (alcohol free) 25 mg every 6 hours as needed Breath right strips Claritin Cepacol throat lozenges Chloraseptic throat spray Cold-Eeze- up to three times per day Cough drops, alcohol free Flonase (by prescription only) Guaifenesin Mucinex Robitussin DM (plain only, alcohol free) Saline nasal spray/drops Sudafed (pseudoephedrine) & Actifed ** use only after [redacted]  weeks gestation and if you do not have high blood pressure Tylenol Vicks Vaporub Zinc lozenges Zyrtec   Constipation: Colace Ducolax suppositories Fleet enema Glycerin suppositories Metamucil Milk of magnesia Miralax Senokot Smooth move  tea  Diarrhea: Kaopectate Imodium A-D  *NO pepto Bismol  Hemorrhoids: Anusol Anusol HC Preparation H Tucks  Indigestion: Tums Maalox Mylanta Zantac  Pepcid  Insomnia: Benadryl (alcohol free) 25mg  every 6 hours as needed Tylenol PM Unisom, no Gelcaps  Leg Cramps: Tums MagGel  Nausea/Vomiting:  Bonine Dramamine Emetrol Ginger extract Sea bands Meclizine  Nausea medication to take during pregnancy:  Unisom (doxylamine succinate 25 mg tablets) Take one tablet daily at bedtime. If symptoms are not adequately controlled, the dose can be increased to a maximum recommended dose of two tablets daily (1/2 tablet in the morning, 1/2 tablet mid-afternoon and one at bedtime). Vitamin B6 100mg  tablets. Take one tablet twice a day (up to 200 mg per day).  Skin Rashes: Aveeno products Benadryl cream or 25mg  every 6 hours as needed Calamine Lotion 1% cortisone cream  Yeast infection: Gyne-lotrimin 7 Monistat 7  Gum/tooth pain: Anbesol  **If taking multiple medications, please check labels to avoid duplicating the same active ingredients **take medication as directed on the label ** Do not exceed 4000 mg of tylenol in 24 hours **Do not take medications that contain aspirin or ibuprofen

## 2015-07-22 NOTE — MAU Provider Note (Signed)
History     CSN: [id number]  Arrival date and time: 07/22/15 1547   First Provider Initiated Contact with Patient 07/22/15 1742      Chief Complaint  Patient presents with  . Emesis During Pregnancy   HPI  [name] is a 25 y.o. G2P0010 at 60w6dwho presents with nausea & vomiting. Reports symptoms x 3 days. States she hasn't been able to keep down food or fluids. Last tried to at yesterday; ate beef tips & gravy but vomited afterwards. Denies abdominal pain, vaginal bleeding, heartburn, diarrhea, or constipation. Has not antiemetics at home.   OB History    Gravida Para Term Preterm AB TAB SAB Ectopic Multiple Living   2    1  1          Past Medical History  Diagnosis Date  . Asthma   . Depression   . Schizoaffective disorder, bipolar type ([place]   . Autism   . Essential tremor   . Chronic constipation   . Acid reflux   . Drug-seeking behavior   . Seizures ([facility]     Past Surgical History  Procedure Laterality Date  . Mouth surgery      Family History  Problem Relation Age of Onset  . Mental illness Father   . Asthma Father   . PDD Brother   . Seizures Brother     Social History  Substance Use Topics  . Smoking status: Former Smoker -- 0.00 packs/day  . Smokeless tobacco: None     Comment: Smoked for 2  years age [age] . Alcohol Use: 0.6 oz/week    1 Standard drinks or equivalent per week    Allergies:  Allergies  Allergen Reactions  . Bee Venom Anaphylaxis  . Coconut Flavor Anaphylaxis  . Geodon [Ziprasidone Hcl] Other (See Comments)    Paralysis of mouth  . Haloperidol And Related Other (See Comments)    Paralysis of mouth   . Lithium Other (See Comments)    Seizure like muscle spasms   . Oxycodone Other (See Comments)    hallucinations   . Seroquel [Quetiapine Fumarate] Other (See Comments)    TOO STRONG  . Prilosec [Omeprazole] Nausea And Vomiting  . Percocet [Oxycodone-Acetaminophen] Other (See Comments)    Causes hallucinations.   . Shellfish Allergy Swelling  . Tegretol [Carbamazepine] Nausea And Vomiting    Prescriptions prior to admission  Medication Sig Dispense Refill Last Dose  . acetaminophen (TYLENOL) 325 MG tablet Take 650 mg by mouth every 6 (six) hours as needed for moderate pain.   Past Week at Unknown time  . cholecalciferol (VITAMIN D-400) 400 UNITS TABS tablet Take 800 Units by mouth daily.    07/22/2015 at Unknown time  . FLOVENT HFA 220 MCG/ACT inhaler Inhale 1 puff into the lungs 2 (two) times daily.  3 07/21/2015 at Unknown time  . levETIRAcetam (KEPPRA) 500 MG tablet Take 1 tablet (500 mg total) by mouth 2 (two) times daily. 60 tablet 0 07/22/2015 at Unknown time  . lurasidone (LATUDA) 40 MG TABS tablet Take 1 tablet (40 mg total) by mouth daily with supper. 30 tablet 0 07/22/2015 at Unknown time  . Prenatal Vit-Fe Fumarate-FA (PRENATAL MULTIVITAMIN) TABS tablet Take 1 tablet by mouth at bedtime. 30 tablet 0 07/21/2015 at Unknown time  . senna (SENOKOT) 8.6 MG tablet Take 2 tablets by mouth at bedtime.   07/21/2015 at Unknown time  . albuterol (PROVENTIL HFA;VENTOLIN HFA) 108 (90 Base) MCG/ACT inhaler Inhale 1-2  puffs into the lungs every 6 (six) hours as needed for wheezing or shortness of breath. Reported on 05/05/2015 (Patient not taking: Reported on 07/22/2015) 1 Inhaler 1   . fluticasone-salmeterol (ADVAIR HFA) 115-21 MCG/ACT inhaler Inhale 2 puffs into the lungs 2 (two) times daily. (Patient not taking: Reported on 07/22/2015) 1 Inhaler 3     Review of Systems  Constitutional: Negative.   Gastrointestinal: Positive for nausea and vomiting. Negative for heartburn, abdominal pain, diarrhea and constipation.  Genitourinary: Negative.    Physical Exam   Blood pressure 112/62, pulse 95, temperature 98 F (36.7 C), temperature source Oral, resp. rate 16, height 5' 4"  (1.626 m), weight 164 lb (74.39 kg), last menstrual period 06/07/2015.  Physical Exam  Nursing note and vitals reviewed. Constitutional:  She is oriented to person, place, and time. She appears well-developed and well-nourished. No distress.  HENT:  Head: Normocephalic and atraumatic.  Eyes: Conjunctivae are normal. Right eye exhibits no discharge. Left eye exhibits no discharge. No scleral icterus.  Neck: Normal range of motion.  Cardiovascular: Normal rate, regular rhythm and normal heart sounds.   No murmur heard. Respiratory: Effort normal and breath sounds normal. No respiratory distress. She has no wheezes.  GI: Soft. Bowel sounds are normal. There is no tenderness.  Neurological: She is alert and oriented to person, place, and time.  Skin: Skin is warm and dry. She is not diaphoretic.  Psychiatric: She has a normal mood and affect. Her behavior is normal. Judgment and thought content normal.    MAU Course  Procedures Results for orders placed or performed during the hospital encounter of 07/22/15 (from the past 72 hour(s))  CBC     Status: Abnormal   Collection Time: 07/22/15  4:10 PM  Result Value Ref Range   WBC 13.6 (H) 4.0 - 10.5 K/uL   RBC 4.33 3.87 - 5.11 MIL/uL   Hemoglobin 12.2 12.0 - 15.0 g/dL   HCT 36.7 36.0 - 46.0 %   MCV 84.8 78.0 - 100.0 fL   MCH 28.2 26.0 - 34.0 pg   MCHC 33.2 30.0 - 36.0 g/dL   RDW 13.1 11.5 - 15.5 %   Platelets 231 150 - 400 K/uL  Comprehensive metabolic panel     Status: None   Collection Time: 07/22/15  4:10 PM  Result Value Ref Range   Sodium 135 135 - 145 mmol/L   Potassium 3.8 3.5 - 5.1 mmol/L   Chloride 104 101 - 111 mmol/L   CO2 22 22 - 32 mmol/L   Glucose, Bld 84 65 - 99 mg/dL   BUN 7 6 - 20 mg/dL   Creatinine, Ser 0.61 0.44 - 1.00 mg/dL   Calcium 9.0 8.9 - 10.3 mg/dL   Total Protein 6.8 6.5 - 8.1 g/dL   Albumin 4.1 3.5 - 5.0 g/dL   AST 16 15 - 41 U/L   ALT 17 14 - 54 U/L   Alkaline Phosphatase 43 38 - 126 U/L   Total Bilirubin 0.6 0.3 - 1.2 mg/dL   GFR calc non Af Amer >60 >60 mL/min   GFR calc Af Amer >60 >60 mL/min    Comment: (NOTE) The eGFR has been  calculated using the CKD EPI equation. This calculation has not been validated in all clinical situations. eGFR's persistently <60 mL/min signify possible Chronic Kidney Disease.    Anion gap 9 5 - 15  Urinalysis, Routine w reflex microscopic (not at [facility])     Status: Abnormal   Collection Time:  07/22/15  4:35 PM  Result Value Ref Range   Color, Urine YELLOW YELLOW   APPearance CLEAR CLEAR   Specific Gravity, Urine 1.025 1.005 - 1.030   pH 6.0 5.0 - 8.0   Glucose, UA NEGATIVE NEGATIVE mg/dL   Hgb urine dipstick NEGATIVE NEGATIVE   Bilirubin Urine NEGATIVE NEGATIVE   Ketones, ur 15 (A) NEGATIVE mg/dL   Protein, ur NEGATIVE NEGATIVE mg/dL   Nitrite NEGATIVE NEGATIVE   Leukocytes, UA NEGATIVE NEGATIVE    Comment: MICROSCOPIC NOT DONE ON URINES WITH NEGATIVE PROTEIN, BLOOD, LEUKOCYTES, NITRITE, OR GLUCOSE <1000 mg/dL.  Pregnancy, urine POC     Status: Abnormal   Collection Time: 07/22/15  4:40 PM  Result Value Ref Range   Preg Test, Ur POSITIVE (A) NEGATIVE    Comment:        THE SENSITIVITY OF THIS METHODOLOGY IS >24 mIU/mL     MDM LR fluid bolus & reglan 10 mg IV Pt reports improvement in symptoms No vomiting in MAU  Assessment and Plan  A: 1. Nausea and vomiting during pregnancy prior to [redacted] weeks gestation     P: Discharge home Rx reglan Discussed diet appropriate for n/v Keep scheduled ob appt in [name] Discussed reasons to return to [place] 07/22/2015, 5:40 PM

## 2015-07-25 ENCOUNTER — Inpatient Hospital Stay (HOSPITAL_COMMUNITY)
Admission: AD | Admit: 2015-07-25 | Discharge: 2015-07-25 | Disposition: A | Discharge status: Home / Self Care | Payer: Medicare Other | Source: Ambulatory Visit | Attending: Obstetrics & Gynecology | Admitting: Obstetrics & Gynecology

## 2015-07-25 ENCOUNTER — Encounter (HOSPITAL_COMMUNITY): Payer: Self-pay | Admitting: *Deleted

## 2015-07-25 DIAGNOSIS — F840 Autistic disorder: Secondary | ICD-10-CM | POA: Insufficient documentation

## 2015-07-25 DIAGNOSIS — Z3A01 Less than 8 weeks gestation of pregnancy: Secondary | ICD-10-CM | POA: Insufficient documentation

## 2015-07-25 DIAGNOSIS — Z87891 Personal history of nicotine dependence: Secondary | ICD-10-CM | POA: Diagnosis not present

## 2015-07-25 DIAGNOSIS — O99511 Diseases of the respiratory system complicating pregnancy, first trimester: Secondary | ICD-10-CM | POA: Insufficient documentation

## 2015-07-25 DIAGNOSIS — J45909 Unspecified asthma, uncomplicated: Secondary | ICD-10-CM | POA: Diagnosis not present

## 2015-07-25 DIAGNOSIS — Z91013 Allergy to seafood: Secondary | ICD-10-CM | POA: Insufficient documentation

## 2015-07-25 DIAGNOSIS — Z888 Allergy status to other drugs, medicaments and biological substances status: Secondary | ICD-10-CM | POA: Diagnosis not present

## 2015-07-25 DIAGNOSIS — Z825 Family history of asthma and other chronic lower respiratory diseases: Secondary | ICD-10-CM | POA: Diagnosis not present

## 2015-07-25 DIAGNOSIS — O219 Vomiting of pregnancy, unspecified: Secondary | ICD-10-CM

## 2015-07-25 DIAGNOSIS — O99341 Other mental disorders complicating pregnancy, first trimester: Secondary | ICD-10-CM | POA: Insufficient documentation

## 2015-07-25 DIAGNOSIS — K920 Hematemesis: Secondary | ICD-10-CM | POA: Diagnosis present

## 2015-07-25 DIAGNOSIS — Z79899 Other long term (current) drug therapy: Secondary | ICD-10-CM | POA: Diagnosis not present

## 2015-07-25 DIAGNOSIS — Z885 Allergy status to narcotic agent status: Secondary | ICD-10-CM | POA: Insufficient documentation

## 2015-07-25 DIAGNOSIS — O99611 Diseases of the digestive system complicating pregnancy, first trimester: Secondary | ICD-10-CM | POA: Diagnosis not present

## 2015-07-25 DIAGNOSIS — F259 Schizoaffective disorder, unspecified: Secondary | ICD-10-CM | POA: Diagnosis not present

## 2015-07-25 HISTORY — DX: Schizoaffective disorder, unspecified: F25.9

## 2015-07-25 HISTORY — DX: Personality disorder, unspecified: F60.9

## 2015-07-25 LAB — URINALYSIS, ROUTINE W REFLEX MICROSCOPIC
Bilirubin Urine: NEGATIVE
Glucose, UA: NEGATIVE mg/dL
Hgb urine dipstick: NEGATIVE
Ketones, ur: NEGATIVE mg/dL
LEUKOCYTES UA: NEGATIVE
NITRITE: NEGATIVE
PH: 5.5 (ref 5.0–8.0)
Protein, ur: NEGATIVE mg/dL

## 2015-07-25 MED ORDER — PROMETHAZINE HCL 25 MG PO TABS: 25.0000 mg | ORAL_TABLET | Freq: Four times a day (QID) | ORAL | Status: DC | PRN

## 2015-07-25 MED ORDER — ONDANSETRON 4 MG PO TBDP: 4.0000 mg | ORAL_TABLET | Freq: Four times a day (QID) | ORAL | Status: DC | PRN

## 2015-07-25 NOTE — Telephone Encounter (Signed)
-----   Message from Britt Bottom, MD sent at 07/24/2015 12:10 PM EDT ----- Please let her know the EEG is normal

## 2015-07-25 NOTE — MAU Note (Signed)
Hx of mental health problems; attempted a drug overdose 3 weeks ago but she did not know she was pregnant at that time; c/o N&V for past week- got a reglan rx 3 days ago from MAU;  Reglan works sometimes; c/o seeing blood in her vomitus this afternoon; vomited 6 times yesterday and 2 times today so far;

## 2015-07-25 NOTE — MAU Provider Note (Signed)
MAU HISTORY AND PHYSICAL  Chief Complaint:  Hematemesis   [name] is a 25 y.o.  G2P0010 with IUP at [redacted]w[redacted]d presenting for Hematemesis  Has had nausea/vomiting for one week. Seen in this mau 3 days ago, given reglan, sent home. Taking that at home, helping some. Prior to reglan was vomiting 3-4 times a day. Today has vomited twice. Tolerating liquids, urinating normally. Here today because saw some blood in vomit. Blood mixed with vomit. No abdominal pain. No back pain, no fever, no dysuria or hematuria. No vaginal bleeding.    Past Medical History  Diagnosis Date  . Asthma   . Depression   . Schizoaffective disorder, bipolar type ([place])   . Autism   . Essential tremor   . Chronic constipation   . Acid reflux   . Drug-seeking behavior   . Seizures ([place])   . Schizo-affective psychosis ([place])   . Personality disorder     Past Surgical History  Procedure Laterality Date  . Mouth surgery      Family History  Problem Relation Age of Onset  . Mental illness Father   . Asthma Father   . PDD Brother   . Seizures Brother     Social History  Substance Use Topics  . Smoking status: Former Smoker -- 0.00 packs/day  . Smokeless tobacco: None     Comment: Smoked for 2  years age [age]  . Alcohol Use: 0.6 oz/week    1 Standard drinks or equivalent per week    Allergies  Allergen Reactions  . Bee Venom Anaphylaxis  . Coconut Flavor Anaphylaxis  . Geodon [Ziprasidone Hcl] Other (See Comments)    Paralysis of mouth  . Haloperidol And Related Other (See Comments)    Paralysis of mouth   . Lithium Other (See Comments)    Seizure like muscle spasms   . Oxycodone Other (See Comments)    hallucinations   . Seroquel [Quetiapine Fumarate] Other (See Comments)    TOO STRONG  . Prilosec [Omeprazole] Nausea And Vomiting  . Percocet [Oxycodone-Acetaminophen] Other (See Comments)    Causes hallucinations.  . Shellfish Allergy Swelling  . Tegretol [Carbamazepine] Nausea And  Vomiting    Prescriptions prior to admission  Medication Sig Dispense Refill Last Dose  . acetaminophen (TYLENOL) 325 MG tablet Take 650 mg by mouth every 6 (six) hours as needed for moderate pain.   Past Week at Unknown time  . albuterol (PROVENTIL HFA;VENTOLIN HFA) 108 (90 Base) MCG/ACT inhaler Inhale 1-2 puffs into the lungs every 6 (six) hours as needed for wheezing or shortness of breath. Reported on 05/05/2015 1 Inhaler 1 Past Week at Unknown time  . cholecalciferol (VITAMIN D-400) 400 UNITS TABS tablet Take 800 Units by mouth daily.    07/25/2015 at Unknown time  . diphenhydrAMINE-zinc acetate (BENADRYL) cream Apply 1 application topically 3 (three) times daily as needed for itching.   07/25/2015 at Unknown time  . fluticasone-salmeterol (ADVAIR HFA) 115-21 MCG/ACT inhaler Inhale 2 puffs into the lungs 2 (two) times daily. 1 Inhaler 3 07/25/2015 at Unknown time  . levETIRAcetam (KEPPRA) 500 MG tablet Take 1 tablet (500 mg total) by mouth 2 (two) times daily. 60 tablet 0 07/25/2015 at Unknown time  . lurasidone (LATUDA) 40 MG TABS tablet Take 1 tablet (40 mg total) by mouth daily with supper. 30 tablet 0 07/25/2015 at Unknown time  . metoCLOPramide (REGLAN) 10 MG tablet Take 1 tablet (10 mg total) by mouth every 6 (six) hours as  needed for nausea. 60 tablet 0 07/25/2015 at Unknown time  . Prenatal Vit-Fe Fumarate-FA (PRENATAL MULTIVITAMIN) TABS tablet Take 1 tablet by mouth at bedtime. 30 tablet 0 07/24/2015 at Unknown time  . senna (SENOKOT) 8.6 MG tablet Take 2 tablets by mouth at bedtime.   07/24/2015 at Unknown time    Review of Systems - Negative except for what is mentioned in HPI.  Physical Exam  Blood pressure 116/61, pulse 101, temperature 98.7 F (37.1 C), temperature source Oral, resp. rate 18, weight 161 lb 1.9 oz (73.084 kg), last menstrual period 06/07/2015. GENERAL: Well-developed, well-nourished female in no acute distress.  LUNGS: Clear to auscultation bilaterally.  HEART:  Regular rate and rhythm. ABDOMEN: Soft, nontender, nondistended, .  EXTREMITIES: Nontender, no edema, 2+ distal pulses.    Labs: No results found for this or any previous visit (from the past 24 hour(s)).  Imaging Studies:  US Ob Comp Less 14 Wks  07/10/2015  CLINICAL DATA:  Pelvic pain in first-trimester pregnancy. Quantitative beta HCG 7,400. EXAM: OBSTETRIC <14 WK ULTRASOUND TECHNIQUE: Transabdominal ultrasound was performed for evaluation of the gestation as well as the maternal uterus and adnexal regions. COMPARISON:  05/10/2015 FINDINGS: No definitive intra or extrauterine gestational sac is identified. A 5 mm cystic structure is noted in the endometrial cavity. Intra-ovarian cystic structure on the right consistent with follicular process. No adnexal mass or hemoperitoneum. Consent for transvaginal examination could not be obtained from patient. IMPRESSION: 5 mm cystic structure in the endometrial cavity could reflect an early gestational sac but is indeterminate on this transabdominal study. No adnexal mass or pelvic fluid. Recommend follow-up quantitative B-HCG levels and follow-up US in 14 days to confirm and assess viability. This recommendation follows SRU consensus guidelines: Diagnostic Criteria for Nonviable Pregnancy Early in the First Trimester. [employer] Med [id number]. Electronically Signed   By: Monte Fantasia M.D.   On: 07/10/2015 05:37    Assessment: [name] is  24 y.o. G2P0010 at [redacted]w[redacted]d presents with Hematemesis Mild. N/V has actually improved since starting reglan. Does not describe dehydration and does not appear dehdyrated. Normal cmp on check 3 days ago. This likely a mallory weiss tear from vomiting.   Plan: - d/c reglan as increases risk of extrapyramidal side effects (patient takes latuda) - start phenergan, with zofran prn - provided appropriate eating advice, and push po fluids - vomiting of pregnancy return precautions  Desma Maxim 6/19/20176:15  PM

## 2015-07-25 NOTE — Discharge Instructions (Signed)
Morning Sickness  Morning sickness is when you feel sick to your stomach (nauseous) during pregnancy. You may feel sick to your stomach and throw up (vomit). You may feel sick in the morning, but you can feel this way any time of day. Some women feel very sick to their stomach and cannot stop throwing up (hyperemesis gravidarum).  HOME CARE  · Only take medicines as told by your doctor.  · Take multivitamins as told by your doctor. Taking multivitamins before getting pregnant can stop or lessen the harshness of morning sickness.  · Eat dry toast or unsalted crackers before getting out of bed.  · Eat 5 to 6 small meals a day.  · Eat dry and bland foods like rice and baked potatoes.  · Do not drink liquids with meals. Drink between meals.  · Do not eat greasy, fatty, or spicy foods.  · Have someone cook for you if the smell of food causes you to feel sick or throw up.  · If you feel sick to your stomach after taking prenatal vitamins, take them at night or with a snack.  · Eat protein when you need a snack (nuts, yogurt, cheese).  · Eat unsweetened gelatins for dessert.  · Wear a bracelet used for sea sickness (acupressure wristband).  · Go to a doctor that puts thin needles into certain body points (acupuncture) to improve how you feel.  · Do not smoke.  · Use a humidifier to keep the air in your house free of odors.  · Get lots of fresh air.  GET HELP IF:  · You need medicine to feel better.  · You feel dizzy or lightheaded.  · You are losing weight.  GET HELP RIGHT AWAY IF:   · You feel very sick to your stomach and cannot stop throwing up.  · You pass out (faint).  MAKE SURE YOU:  · Understand these instructions.  · Will watch your condition.  · Will get help right away if you are not doing well or get worse.     This information is not intended to replace advice given to you by your health care provider. Make sure you discuss any questions you have with your health care provider.     Document Released: 03/01/2004  Document Revised: 02/12/2014 Document Reviewed: 07/09/2012  Elsevier Interactive Patient Education ©2016 [employer]

## 2015-07-25 NOTE — MAU Note (Addendum)
Vomited blood  1 time, mix of bright red and dark blood. Has been vomiting a lot.

## 2015-07-25 NOTE — Telephone Encounter (Signed)
I have spoken with [name] this afternoon and per RAS, advised that EEG is normal.  She verbalized understanding of same/fim

## 2015-07-27 ENCOUNTER — Ambulatory Visit: Payer: Medicare Other | Admitting: Neurology

## 2015-07-27 ENCOUNTER — Emergency Department (HOSPITAL_COMMUNITY)
Admission: EM | Admit: 2015-07-27 | Discharge: 2015-07-28 | Disposition: A | Discharge status: Home / Self Care | Payer: Medicare Other | Attending: Emergency Medicine | Admitting: Emergency Medicine

## 2015-07-27 ENCOUNTER — Encounter (HOSPITAL_COMMUNITY): Payer: Self-pay | Admitting: *Deleted

## 2015-07-27 ENCOUNTER — Ambulatory Visit (INDEPENDENT_AMBULATORY_CARE_PROVIDER_SITE_OTHER): Payer: Medicare Other | Admitting: Neurology

## 2015-07-27 ENCOUNTER — Encounter: Payer: Self-pay | Admitting: Neurology

## 2015-07-27 VITALS — BP 104/62 | HR 70 | Resp 14 | Ht 64.0 in | Wt 162.5 lb

## 2015-07-27 DIAGNOSIS — F259 Schizoaffective disorder, unspecified: Secondary | ICD-10-CM

## 2015-07-27 DIAGNOSIS — R569 Unspecified convulsions: Secondary | ICD-10-CM | POA: Diagnosis not present

## 2015-07-27 DIAGNOSIS — G43909 Migraine, unspecified, not intractable, without status migrainosus: Secondary | ICD-10-CM | POA: Insufficient documentation

## 2015-07-27 DIAGNOSIS — F329 Major depressive disorder, single episode, unspecified: Secondary | ICD-10-CM | POA: Insufficient documentation

## 2015-07-27 DIAGNOSIS — F250 Schizoaffective disorder, bipolar type: Secondary | ICD-10-CM

## 2015-07-27 DIAGNOSIS — Z3490 Encounter for supervision of normal pregnancy, unspecified, unspecified trimester: Secondary | ICD-10-CM

## 2015-07-27 DIAGNOSIS — Z79899 Other long term (current) drug therapy: Secondary | ICD-10-CM | POA: Insufficient documentation

## 2015-07-27 DIAGNOSIS — G43709 Chronic migraine without aura, not intractable, without status migrainosus: Secondary | ICD-10-CM | POA: Insufficient documentation

## 2015-07-27 DIAGNOSIS — F209 Schizophrenia, unspecified: Secondary | ICD-10-CM | POA: Insufficient documentation

## 2015-07-27 DIAGNOSIS — F840 Autistic disorder: Secondary | ICD-10-CM | POA: Diagnosis not present

## 2015-07-27 DIAGNOSIS — IMO0002 Reserved for concepts with insufficient information to code with codable children: Secondary | ICD-10-CM | POA: Insufficient documentation

## 2015-07-27 DIAGNOSIS — J45909 Unspecified asthma, uncomplicated: Secondary | ICD-10-CM | POA: Diagnosis not present

## 2015-07-27 DIAGNOSIS — R443 Hallucinations, unspecified: Secondary | ICD-10-CM

## 2015-07-27 LAB — COMPREHENSIVE METABOLIC PANEL
ALT: 15 U/L (ref 14–54)
ANION GAP: 6 (ref 5–15)
AST: 17 U/L (ref 15–41)
Albumin: 4 g/dL (ref 3.5–5.0)
Alkaline Phosphatase: 40 U/L (ref 38–126)
BILIRUBIN TOTAL: 0.5 mg/dL (ref 0.3–1.2)
BUN: 8 mg/dL (ref 6–20)
CHLORIDE: 110 mmol/L (ref 101–111)
CO2: 19 mmol/L — ABNORMAL LOW (ref 22–32)
Calcium: 8.9 mg/dL (ref 8.9–10.3)
Creatinine, Ser: 0.49 mg/dL (ref 0.44–1.00)
Glucose, Bld: 92 mg/dL (ref 65–99)
POTASSIUM: 3.5 mmol/L (ref 3.5–5.1)
Sodium: 135 mmol/L (ref 135–145)
TOTAL PROTEIN: 6.7 g/dL (ref 6.5–8.1)

## 2015-07-27 LAB — CBC
HCT: 36.1 % (ref 36.0–46.0)
Hemoglobin: 12.1 g/dL (ref 12.0–15.0)
MCH: 28.3 pg (ref 26.0–34.0)
MCHC: 33.5 g/dL (ref 30.0–36.0)
MCV: 84.5 fL (ref 78.0–100.0)
PLATELETS: 220 10*3/uL (ref 150–400)
RBC: 4.27 MIL/uL (ref 3.87–5.11)
RDW: 13 % (ref 11.5–15.5)
WBC: 11.6 10*3/uL — AB (ref 4.0–10.5)

## 2015-07-27 LAB — ETHANOL

## 2015-07-27 LAB — SALICYLATE LEVEL

## 2015-07-27 LAB — ACETAMINOPHEN LEVEL

## 2015-07-27 LAB — RAPID URINE DRUG SCREEN, HOSP PERFORMED
Amphetamines: NOT DETECTED
BENZODIAZEPINES: NOT DETECTED
Barbiturates: NOT DETECTED
COCAINE: NOT DETECTED
OPIATES: NOT DETECTED
Tetrahydrocannabinol: NOT DETECTED

## 2015-07-27 MED ORDER — ONDANSETRON HCL 4 MG PO TABS: 4.0000 mg | ORAL_TABLET | Freq: Three times a day (TID) | ORAL | Status: DC | PRN

## 2015-07-27 MED ORDER — ALUM & MAG HYDROXIDE-SIMETH 200-200-20 MG/5ML PO SUSP: 30.0000 mL | ORAL | Status: DC | PRN

## 2015-07-27 MED ORDER — LEVETIRACETAM 500 MG PO TABS: 500.0000 mg | ORAL_TABLET | Freq: Two times a day (BID) | ORAL | Status: DC

## 2015-07-27 MED ORDER — ACETAMINOPHEN 325 MG PO TABS: 650.0000 mg | ORAL_TABLET | ORAL | Status: DC | PRN

## 2015-07-27 NOTE — ED Notes (Signed)
Pt says for 24 hours she has been hearing voices that are telling her to kill herself. She said that just a few minutes ago the voices were telling her to hit the officer standing in the hall in triage. Pt says she doesn't want to be on any medications that will harm her baby (reports [redacted] weeks pregnant), only on latuda.

## 2015-07-27 NOTE — ED Provider Notes (Signed)
CSN: [id number]     Arrival date & time 07/27/15  2204 History   First MD Initiated Contact with Patient 07/27/15 2314     Chief Complaint  Patient presents with  . Hallucinations     (Consider location/radiation/quality/duration/timing/severity/associated sxs/prior Treatment) Patient is a 25 y.o. female presenting with mental health disorder.  Mental Health Problem Presenting symptoms: hallucinations   Patient accompanied by:  Child Degree of incapacity (severity):  Mild Onset quality:  Gradual Timing:  Constant Progression:  Worsening Chronicity:  Recurrent Context: not alcohol use and not noncompliant   Treatment compliance:  Untreated   Past Medical History  Diagnosis Date  . Asthma   . Depression   . Schizoaffective disorder, bipolar type ([place])   . Autism   . Essential tremor   . Chronic constipation   . Acid reflux   . Drug-seeking behavior   . Seizures ([place])   . Schizo-affective psychosis ([place])   . Personality disorder    Past Surgical History  Procedure Laterality Date  . Mouth surgery     Family History  Problem Relation Age of Onset  . Mental illness Father   . Asthma Father   . PDD Brother   . Seizures Brother    Social History  Substance Use Topics  . Smoking status: Former Smoker -- 0.00 packs/day  . Smokeless tobacco: None     Comment: Smoked for 2  years age [age]  . Alcohol Use: 0.6 oz/week    1 Standard drinks or equivalent per week   OB History    Gravida Para Term Preterm AB TAB SAB Ectopic Multiple Living   2    1  1         Review of Systems  Psychiatric/Behavioral: Positive for hallucinations.  All other systems reviewed and are negative.     Allergies  Bee venom; Coconut flavor; Geodon; Haloperidol and related; Lithium; Oxycodone; Seroquel; Prilosec; Percocet; Shellfish allergy; and Tegretol  Home Medications   Prior to Admission medications   Medication Sig Start Date End Date Taking? Authorizing Provider  acetaminophen  (TYLENOL) 325 MG tablet Take 650 mg by mouth every 6 (six) hours as needed for moderate pain.   Yes Historical Provider, MD  albuterol (PROVENTIL HFA;VENTOLIN HFA) 108 (90 Base) MCG/ACT inhaler Inhale 1-2 puffs into the lungs every 6 (six) hours as needed for wheezing or shortness of breath. Reported on 05/05/2015 07/20/15  Yes Wendie Agreste, MD  cholecalciferol (VITAMIN D-400) 400 UNITS TABS tablet Take 800 Units by mouth daily.    Yes Historical Provider, MD  fluticasone-salmeterol (ADVAIR HFA) 115-21 MCG/ACT inhaler Inhale 2 puffs into the lungs 2 (two) times daily. 07/20/15  Yes Wendie Agreste, MD  levETIRAcetam (KEPPRA) 500 MG tablet Take 1 tablet (500 mg total) by mouth 2 (two) times daily. 07/27/15  Yes Britt Bottom, MD  lurasidone (LATUDA) 40 MG TABS tablet Take 1 tablet (40 mg total) by mouth daily with supper. 07/18/15  Yes Hildred Priest, MD  Menthol, Topical Analgesic, (BENGAY EX) Apply 1 application topically daily as needed (pain).    Yes Historical Provider, MD  Prenatal Vit-Fe Fumarate-FA (PRENATAL MULTIVITAMIN) TABS tablet Take 1 tablet by mouth at bedtime. 07/18/15  Yes Hildred Priest, MD  promethazine (PHENERGAN) 25 MG tablet Take 1 tablet (25 mg total) by mouth every 6 (six) hours as needed for nausea or vomiting. 07/25/15  Yes Gwynne Edinger, MD  senna (SENOKOT) 8.6 MG tablet Take 2 tablets by mouth at bedtime.  Yes Historical Provider, MD  ondansetron (ZOFRAN ODT) 4 MG disintegrating tablet Take 1 tablet (4 mg total) by mouth every 6 (six) hours as needed for nausea. Patient not taking: Reported on 07/27/2015 07/25/15   Gwynne Edinger, MD   BP 110/59 mmHg  Pulse 98  Temp(Src) 98.3 F (36.8 C) (Oral)  Resp 16  Ht 5\' 4"  (1.626 m)  Wt 162 lb (73.483 kg)  BMI 27.79 kg/m2  SpO2 97%  LMP 06/07/2015 Physical Exam  Constitutional: She appears well-developed and well-nourished.  HENT:  Head: Normocephalic and atraumatic.  Neck: Normal range of  motion.  Cardiovascular: Normal rate and regular rhythm.   Pulmonary/Chest: No stridor. No respiratory distress.  Abdominal: She exhibits no distension.  Neurological: She is alert.  Psychiatric: Her speech is tangential. She is actively hallucinating.  Nursing note and vitals reviewed.   ED Course  Procedures (including critical care time) Labs Review Labs Reviewed  COMPREHENSIVE METABOLIC PANEL - Abnormal; Notable for the following:    CO2 19 (*)    All other components within normal limits  ACETAMINOPHEN LEVEL - Abnormal; Notable for the following:    Acetaminophen (Tylenol), Serum <10 (*)    All other components within normal limits  CBC - Abnormal; Notable for the following:    WBC 11.6 (*)    All other components within normal limits  ETHANOL  SALICYLATE LEVEL  URINE RAPID DRUG SCREEN, HOSP PERFORMED    Imaging Review No results found. I have personally reviewed and evaluated these images and lab results as part of my medical decision-making.   EKG Interpretation None      MDM   Final diagnoses:  Hallucinations    Worsening hallucinations. Commanding suicide. Also commanding her to hurt others. Also pregnant but no other medical conditions precluding TTS consult.     Merrily Pew, MD 07/27/15 2350

## 2015-07-27 NOTE — Progress Notes (Signed)
[name]  PATIENT: [name] DOB: [birth year]  REFERRING DOCTOR OR PCP:  She is in the process of changing primary care. She sees [place] for psychiatry (phone:    [phone]) SOURCE: Patient, husband records in EMR  _________________________________   HISTORICAL  CHIEF COMPLAINT:  Chief Complaint  Patient presents with  . Migraines    Sts. h/a's are less frequent, same severity.  Sts. she is [redacted] weeks pregnant, so has stopped Depakote and is now on Keppra 500mg  bid.  Sts. she has not been able to keep all doses down due to n/v, and has had 2 sz. this week/fim  . Seizures    HISTORY OF PRESENT ILLNESS:  [name] is a 25 year old woman with schizoaffective disorder who has had multiple spells of altered consciousness and has frequent, near daily, migraine headaches.   She is now [redacted] weeks pregnant  She reportss having seizures every 3-4 days but notes vomiting a lot as she is  [redacted] weeks pregnant.   She feels she is vomiting up 1/3 to 1/2 the pills the past 2 weeks.     She stopped the Depakote when she was pregnant.   Last month, she had no more than 1 spell.     She states she usually loses consciousness with the spells x 20-60 seconds.   Some have had convulsions.    She feels she is groggy x 1-3 minutes after a seizure and then is back to her baseline a few minutes later.    They have occurred while awake and while asleep.         She reports a history of migraines x many years.    She reports pain is throbbing and mostly in the forehead bilaterally , towards the vertex.    She states migraines occur on a daily basis.   She gets nausea but not vomiting.   She has photophobia and phonophobia.    She feels dizzy when they occur.   Depakote helped much more than Keppra.    She had to stop the Depakote.  She has had a lot of nausea and vomiting the past 2 weeks and is on phenergan.   She feels better the past 2 days and has been able to keep her pills  She  reports a concussion 04/15/15 after hitting her head on the hardwood floor when she fell out of bed.     She had LOC x 1 minute or so.  She went to [facility] ED.  She had nausea, vomiting and felt confusion x 1 week afterwardsand memory loss.    She has had other head injuries in the past but without loss of consciousness  Her brother has a h/o seizures since age 19 with staring spells.     She has a complicated psychiatric history and has been diagnosed with Bipolar disease and with schizoaffective disease.   She is currently on Abilify and thorazine for these disorders and Prazosin for PTSD.  She also reports a lot of anxiety.   She sees Transport planner for [employer]     REVIEW OF SYSTEMS: Constitutional: No fevers, chills, sweats, or change in appetite.   Poor sleep Eyes: No visual changes, double vision, eye pain Ear, nose and throat: No hearing loss, ear pain, nasal congestion, sore throat Cardiovascular: No chest pain, palpitations Respiratory: No shortness of breath at rest or with exertion.   No wheezes GastrointestinaI: No nausea, vomiting, diarrhea, abdominal pain, fecal incontinence  Genitourinary: No dysuria, urinary retention or frequency.  No nocturia. Musculoskeletal: Some neck pain and back pain Integumentary: No rash, pruritus, skin lesions Neurological: as above Psychiatric: see above Endocrine: No palpitations, diaphoresis, change in appetite, change in weigh or increased thirst Hematologic/Lymphatic: No anemia, purpura, petechiae. Allergic/Immunologic: No itchy/runny eyes, nasal congestion, recent allergic reactions, rashes  ALLERGIES: Allergies  Allergen Reactions  . Bee Venom Anaphylaxis  . Coconut Flavor Anaphylaxis  . Geodon [Ziprasidone Hcl] Other (See Comments)    Paralysis of mouth  . Haloperidol And Related Other (See Comments)    Paralysis of mouth   . Lithium Other (See Comments)    Seizure like muscle spasms   . Oxycodone Other (See Comments)     hallucinations   . Seroquel [Quetiapine Fumarate] Other (See Comments)    TOO STRONG  . Prilosec [Omeprazole] Nausea And Vomiting  . Percocet [Oxycodone-Acetaminophen] Other (See Comments)    Causes hallucinations.  . Shellfish Allergy Swelling  . Tegretol [Carbamazepine] Nausea And Vomiting    HOME MEDICATIONS:  Current outpatient prescriptions:  .  acetaminophen (TYLENOL) 325 MG tablet, Take 650 mg by mouth every 6 (six) hours as needed for moderate pain., Disp: , Rfl:  .  albuterol (PROVENTIL HFA;VENTOLIN HFA) 108 (90 Base) MCG/ACT inhaler, Inhale 1-2 puffs into the lungs every 6 (six) hours as needed for wheezing or shortness of breath. Reported on 05/05/2015, Disp: 1 Inhaler, Rfl: 1 .  cholecalciferol (VITAMIN D-400) 400 UNITS TABS tablet, Take 800 Units by mouth daily. , Disp: , Rfl:  .  fluticasone-salmeterol (ADVAIR HFA) 115-21 MCG/ACT inhaler, Inhale 2 puffs into the lungs 2 (two) times daily., Disp: 1 Inhaler, Rfl: 3 .  levETIRAcetam (KEPPRA) 500 MG tablet, Take 1 tablet (500 mg total) by mouth 2 (two) times daily., Disp: 60 tablet, Rfl: 5 .  lurasidone (LATUDA) 40 MG TABS tablet, Take 1 tablet (40 mg total) by mouth daily with supper., Disp: 30 tablet, Rfl: 0 .  Menthol, Topical Analgesic, (BENGAY EX), Apply topically., Disp: , Rfl:  .  ondansetron (ZOFRAN ODT) 4 MG disintegrating tablet, Take 1 tablet (4 mg total) by mouth every 6 (six) hours as needed for nausea., Disp: 30 tablet, Rfl: 2 .  Prenatal Vit-Fe Fumarate-FA (PRENATAL MULTIVITAMIN) TABS tablet, Take 1 tablet by mouth at bedtime., Disp: 30 tablet, Rfl: 0 .  promethazine (PHENERGAN) 25 MG tablet, Take 1 tablet (25 mg total) by mouth every 6 (six) hours as needed for nausea or vomiting., Disp: 45 tablet, Rfl: 2 .  senna (SENOKOT) 8.6 MG tablet, Take 2 tablets by mouth at bedtime., Disp: , Rfl:   PAST MEDICAL HISTORY: Past Medical History  Diagnosis Date  . Asthma   . Depression   . Schizoaffective disorder, bipolar  type (HCC)   . Autism   . Essential tremor   . Chronic constipation   . Acid reflux   . Drug-seeking behavior   . Seizures (HCC)   . Schizo-affective psychosis (HCC)   . Personality disorder     PAST SURGICAL HISTORY: Past Surgical History  Procedure Laterality Date  . Mouth surgery      FAMILY HISTORY: Family History  Problem Relation Age of Onset  . Mental illness Father   . Asthma Father   . PDD Brother   . Seizures Brother     SOCIAL HISTORY:  Social History   Social History  . Marital Status: Single    Spouse Name: N/A  . Number of Children: 0  . Years of  Education: N/A   Occupational History  . disability    Social History Main Topics  . Smoking status: Former Smoker -- 0.00 packs/day  . Smokeless tobacco: Not on file     Comment: Smoked for 2  years age [age]  . Alcohol Use: 0.6 oz/week    1 Standard drinks or equivalent per week  . Drug Use: No     Comment: History of cocaine use at age 67 for 4 months  . Sexual Activity: Yes    Birth Control/ Protection: None   Other Topics Concern  . Not on file   Social History Narrative   Marital status: married; guardian is Midwife      Children: none      Lives: with husband in apartment      Employment:  Disability      Tobacco: quit smoking; smoked for two years.      Alcohol ;none      Drugs: none             PHYSICAL EXAM  Filed Vitals:   07/27/15 1108  BP: 104/62  Pulse: 70  Resp: 14  Height: 5\' 4"  (1.626 m)  Weight: 162 lb 8 oz (73.71 kg)    Body mass index is 27.88 kg/(m^2).   General: The patient is well-developed and well-nourished and in no acute distress  Skin: Extremities are without significant edema.  Musculoskeletal:  Back is nontender  Neurologic Exam  Mental status: The patient is alert and oriented x 3 at the time of the examination. The patient has apparent normal recent and remote memory, with an apparently normal attention span and concentration ability.    Speech is normal.  Cranial nerves: Extraocular movements are full. Pupils are equal, round, and reactive to light and accomodation.  Visual fields are full.  Facial symmetry is present. There is good facial sensation to soft touch bilaterally.Facial strength is normal.  Trapezius and sternocleidomastoid strength is normal. No dysarthria is noted.  The tongue is midline, and the patient has symmetric elevation of the soft palate. No obvious hearing deficits are noted.  Motor:  Muscle bulk is normal.   Tone is normal. Strength is  5 / 5 in all 4 extremities.   Sensory: Sensory testing is intact to pinprick, soft touch and vibration sensation in arms and she reports slightly reduced vibratory sensation in the left leg compared to the right..  Coordination: Cerebellar testing reveals good finger-nose-finger and heel-to-shin bilaterally.  Gait and station: Station is normal.   Gait is normal. Tandem gait is wide. Romberg is negative.   Reflexes: Deep tendon reflexes are symmetric and normal bilaterally.   Plantar responses are flexor.    DIAGNOSTIC DATA (LABS, IMAGING, TESTING) - I reviewed patient records, labs, notes, testing and imaging myself where available.  Lab Results  Component Value Date   WBC 13.6* 07/22/2015   HGB 12.2 07/22/2015   HCT 36.7 07/22/2015   MCV 84.8 07/22/2015   PLT 231 07/22/2015      Component Value Date/Time   NA 135 07/22/2015 1610   NA 140 05/31/2015 1241   K 3.8 07/22/2015 1610   CL 104 07/22/2015 1610   CO2 22 07/22/2015 1610   GLUCOSE 84 07/22/2015 1610   GLUCOSE 100* 05/31/2015 1241   BUN 7 07/22/2015 1610   BUN 6 05/31/2015 1241   CREATININE 0.61 07/22/2015 1610   CREATININE 0.60 05/05/2015 1051   CALCIUM 9.0 07/22/2015 1610   PROT 6.8 07/22/2015 1610  PROT 7.3 05/31/2015 1241   ALBUMIN 4.1 07/22/2015 1610   ALBUMIN 4.7 05/31/2015 1241   AST 16 07/22/2015 1610   ALT 17 07/22/2015 1610   ALKPHOS 43 07/22/2015 1610   BILITOT 0.6 07/22/2015  1610   BILITOT 0.3 05/31/2015 1241   GFRNONAA >60 07/22/2015 1610   GFRAA >60 07/22/2015 1610    Lab Results  Component Value Date   TSH 1.20 05/05/2015       ASSESSMENT AND PLAN  Convulsions, unspecified convulsion type (HCC)  Pregnancy  Schizoaffective disorder, bipolar type by history(HCC)  Chronic migraine     1.    Continue Keppra 500 mg po bid. 2.    She is advised to try to get ample sleep as sleep deprivation can also lower the seizure threshold 3.    She will return to see [name] in 4-5 months or call sooner if new or worsening neurologic symptoms.     Lyndle Pang A. Epimenio Foot, MD, PhD 07/27/2015, 6:54 PM Certified in Neurology, Clinical Neurophysiology, Sleep Medicine, Pain Medicine and Neuroimaging  [facility] [address] [place], [place] [address] [phone]

## 2015-07-27 NOTE — ED Notes (Signed)
Pt has been changed into scrubs, wanded and belongings removed. 1 white band with stones, 1 white band with stones and large stone, 1 purple bracelet, set of green earrings and 99$ cash locked in security safe #3

## 2015-07-28 ENCOUNTER — Ambulatory Visit: Payer: Medicare Other | Admitting: Neurology

## 2015-07-28 DIAGNOSIS — R443 Hallucinations, unspecified: Secondary | ICD-10-CM | POA: Diagnosis not present

## 2015-07-28 MED ORDER — LURASIDONE HCL 40 MG PO TABS
40.0000 mg | ORAL_TABLET | Freq: Every day | ORAL | Status: DC
  Filled 2015-07-28: qty 1

## 2015-07-28 MED ORDER — ALBUTEROL SULFATE HFA 108 (90 BASE) MCG/ACT IN AERS: 1.0000 | INHALATION_SPRAY | Freq: Four times a day (QID) | RESPIRATORY_TRACT | Status: DC | PRN

## 2015-07-28 MED ORDER — PRENATAL MULTIVITAMIN CH
1.0000 | ORAL_TABLET | Freq: Every day | ORAL | Status: DC
  Filled 2015-07-28: qty 1

## 2015-07-28 MED ORDER — SENNA 8.6 MG PO TABS
2.0000 | ORAL_TABLET | Freq: Every day | ORAL | Status: DC
  Filled 2015-07-28: qty 2

## 2015-07-28 MED ORDER — LEVETIRACETAM 500 MG PO TABS
500.0000 mg | ORAL_TABLET | Freq: Two times a day (BID) | ORAL | Status: DC
  Filled 2015-07-28 (×2): qty 1

## 2015-07-28 MED ORDER — PROMETHAZINE HCL 25 MG PO TABS
25.0000 mg | ORAL_TABLET | Freq: Four times a day (QID) | ORAL | Status: DC | PRN
  Administered 2015-07-28: 25 mg via ORAL
  Filled 2015-07-28: qty 1

## 2015-07-28 NOTE — ED Notes (Signed)
EDP at bedside to assess the pt

## 2015-07-28 NOTE — ED Notes (Signed)
Pt states " I am here voluntarily and Im not staying here, I am gonna catch a cab and leave". Notified dr. Lenoria Chime of the same, he states he will come to evaluate the pt

## 2015-07-28 NOTE — ED Notes (Signed)
Per edp, pt will be discharged.

## 2015-07-28 NOTE — ED Notes (Signed)
Pt secured belongings from security returned to her.

## 2015-07-28 NOTE — Discharge Instructions (Signed)
Schizoaffective Disorder  Schizoaffective disorder (ScAD) is a mental illness. It causes symptoms that are a mixture of schizophrenia (a psychotic disorder) and an affective (mood) disorder. The schizophrenic symptoms may include delusions, hallucinations, or odd behavior. The mood symptoms may be similar to major depression or bipolar disorder. ScAD may interfere with personal relationships or normal daily activities. People with ScAD are at increased risk for job loss, social isolation, physical health problems, anxiety and substance use disorders, and suicide.  ScAD usually occurs in cycles. Periods of severe symptoms are followed by periods of  less severe symptoms or improvement. The illness affects men and women equally but usually appears at an earlier age (teenage or early adult years) in men. People who have family members with schizophrenia, bipolar disorder, or ScAD are at higher risk of developing ScAD.  SYMPTOMS   At any one time, people with ScAD may have psychotic symptoms only or both psychotic and mood symptoms. The psychotic symptoms include one or more of the following:  · Hearing, seeing, or feeling things that are not there (hallucinations).    · Having fixed, false beliefs (delusions). The delusions usually are of being attacked, harassed, cheated, persecuted, or conspired against (paranoid delusions).  · Speaking in a way that makes no sense to others (disorganized speech).  The psychotic symptoms of ScAD may also include confusing or odd behavior or any of the negative symptoms of schizophrenia. These include loss of motivation for normal daily activities, such as bathing or grooming, withdrawal from other people, and lack of emotions.     The mood symptoms of ScAD occur more often than not. They resemble major depressive disorder or bipolar mania. Symptoms of major depression include depressed mood and four or more of the following:  · Loss of interest in usually pleasurable activities  (anhedonia).  · Sleeping more or less than normal.  · Feeling worthless or excessively guilty.  · Lack of energy or motivation.  · Trouble concentrating.  · Eating more or less than usual.  · Thinking a lot about death or suicide.  Symptoms of bipolar mania include abnormally elevated or irritable mood and increased energy or activity, plus three or more of the following:    · More confidence than normal or feeling that you are able to do anything (grandiosity).  · Feeling rested with less sleep than normal.    · Being easily distracted.    · Talking more than usual or feeling pressured to keep talking.    · Feeling that your thoughts are racing.  · Engaging in high-risk activities such as buying sprees or foolish business decisions.  DIAGNOSIS   ScAD is diagnosed through an assessment by your health care provider. Your health care provider will observe and ask questions about your thoughts, behavior, mood, and ability to function in daily life. Your health care provider may also ask questions about your medical history and use of drugs, including prescription medicines. Your health care provider may also order blood tests and imaging exams. Certain medical conditions and substances can cause symptoms that resemble ScAD. Your health care provider may refer you to a mental health specialist for evaluation.   ScAD is divided into two types. The depressive type is diagnosed if your mood symptoms are limited to major depression. The bipolar type is diagnosed if your mood symptoms are manic or a mixture of manic and depressive symptoms  TREATMENT   ScAD is usually a lifelong illness. Long-term treatment is necessary. The following treatments are available:  · Medicine. Different types of   medicine are used to treat ScAD. The exact combination depends on the type and severity of your symptoms. Antipsychotic medicine is used to control psychotic symptoms such as delusions, paranoia, and hallucinations. Mood stabilizers can  even the highs and lows of bipolar manic mood swings. Antidepressant medicines are used to treat major depressive symptoms.  · Counseling or talk therapy. Individual, group, or family counseling may be helpful in providing education, support, and guidance. Many people with ScAD also benefit from social skills and job skills (vocational) training.  A combination of medicine and counseling is usually best for managing the disorder over time. A procedure in which electricity is applied to the brain through the scalp (electroconvulsive therapy) may be used to treat people with severe manic symptoms that do not respond to medicine and counseling.  HOME CARE INSTRUCTIONS   · Take all your medicine as prescribed.  · Check with your health care provider before starting new prescription or over-the-counter medicines.  · Keep all follow up appointments with your health care provider.  SEEK MEDICAL CARE IF:   · If you are not able to take your medicines as prescribed.  · If your symptoms get worse.  SEEK IMMEDIATE MEDICAL CARE IF:   · You have serious thoughts about hurting yourself or others.     This information is not intended to replace advice given to you by your health care provider. Make sure you discuss any questions you have with your health care provider.     Document Released: 06/04/2006 Document Revised: 02/12/2014 Document Reviewed: 09/05/2012  Elsevier Interactive Patient Education ©2016 [employer]

## 2015-07-30 ENCOUNTER — Encounter (HOSPITAL_COMMUNITY): Payer: Self-pay | Admitting: *Deleted

## 2015-07-30 ENCOUNTER — Emergency Department (HOSPITAL_COMMUNITY)
Admission: EM | Admit: 2015-07-30 | Discharge: 2015-07-30 | Disposition: A | Discharge status: Home / Self Care | Payer: Medicare Other | Attending: Emergency Medicine | Admitting: Emergency Medicine

## 2015-07-30 ENCOUNTER — Other Ambulatory Visit: Payer: Self-pay

## 2015-07-30 ENCOUNTER — Inpatient Hospital Stay (HOSPITAL_COMMUNITY)
Admission: AD | Admit: 2015-07-30 | Discharge: 2015-07-30 | Disposition: A | Discharge status: Home / Self Care | Payer: Medicare Other | Source: Ambulatory Visit | Attending: Obstetrics and Gynecology | Admitting: Obstetrics and Gynecology

## 2015-07-30 DIAGNOSIS — J45909 Unspecified asthma, uncomplicated: Secondary | ICD-10-CM

## 2015-07-30 DIAGNOSIS — Z3A01 Less than 8 weeks gestation of pregnancy: Secondary | ICD-10-CM | POA: Insufficient documentation

## 2015-07-30 DIAGNOSIS — Z79899 Other long term (current) drug therapy: Secondary | ICD-10-CM | POA: Insufficient documentation

## 2015-07-30 DIAGNOSIS — O26891 Other specified pregnancy related conditions, first trimester: Secondary | ICD-10-CM | POA: Insufficient documentation

## 2015-07-30 DIAGNOSIS — Z87891 Personal history of nicotine dependence: Secondary | ICD-10-CM | POA: Insufficient documentation

## 2015-07-30 DIAGNOSIS — T426X5A Adverse effect of other antiepileptic and sedative-hypnotic drugs, initial encounter: Secondary | ICD-10-CM

## 2015-07-30 DIAGNOSIS — R112 Nausea with vomiting, unspecified: Secondary | ICD-10-CM

## 2015-07-30 DIAGNOSIS — R079 Chest pain, unspecified: Secondary | ICD-10-CM | POA: Insufficient documentation

## 2015-07-30 DIAGNOSIS — O99611 Diseases of the digestive system complicating pregnancy, first trimester: Secondary | ICD-10-CM

## 2015-07-30 DIAGNOSIS — O210 Mild hyperemesis gravidarum: Secondary | ICD-10-CM | POA: Insufficient documentation

## 2015-07-30 DIAGNOSIS — O99511 Diseases of the respiratory system complicating pregnancy, first trimester: Secondary | ICD-10-CM

## 2015-07-30 DIAGNOSIS — K219 Gastro-esophageal reflux disease without esophagitis: Secondary | ICD-10-CM | POA: Insufficient documentation

## 2015-07-30 DIAGNOSIS — T50905A Adverse effect of unspecified drugs, medicaments and biological substances, initial encounter: Secondary | ICD-10-CM

## 2015-07-30 DIAGNOSIS — R0602 Shortness of breath: Secondary | ICD-10-CM | POA: Diagnosis not present

## 2015-07-30 LAB — CBC WITH DIFFERENTIAL/PLATELET
Basophils Absolute: 0 10*3/uL (ref 0.0–0.1)
Basophils Relative: 0 %
EOS ABS: 0 10*3/uL (ref 0.0–0.7)
EOS PCT: 0 %
HCT: 39.2 % (ref 36.0–46.0)
HEMOGLOBIN: 12.8 g/dL (ref 12.0–15.0)
LYMPHS ABS: 2.8 10*3/uL (ref 0.7–4.0)
LYMPHS PCT: 30 %
MCH: 27.8 pg (ref 26.0–34.0)
MCHC: 32.7 g/dL (ref 30.0–36.0)
MCV: 85.2 fL (ref 78.0–100.0)
MONOS PCT: 5 %
Monocytes Absolute: 0.4 10*3/uL (ref 0.1–1.0)
Neutro Abs: 6 10*3/uL (ref 1.7–7.7)
Neutrophils Relative %: 65 %
PLATELETS: 221 10*3/uL (ref 150–400)
RBC: 4.6 MIL/uL (ref 3.87–5.11)
RDW: 13 % (ref 11.5–15.5)
WBC: 9.1 10*3/uL (ref 4.0–10.5)

## 2015-07-30 LAB — COMPREHENSIVE METABOLIC PANEL
ALK PHOS: 45 U/L (ref 38–126)
ALT: 16 U/L (ref 14–54)
ANION GAP: 10 (ref 5–15)
AST: 22 U/L (ref 15–41)
Albumin: 3.9 g/dL (ref 3.5–5.0)
BUN: 6 mg/dL (ref 6–20)
CALCIUM: 10.1 mg/dL (ref 8.9–10.3)
CO2: 19 mmol/L — ABNORMAL LOW (ref 22–32)
CREATININE: 0.8 mg/dL (ref 0.44–1.00)
Chloride: 108 mmol/L (ref 101–111)
Glucose, Bld: 96 mg/dL (ref 65–99)
Potassium: 3.9 mmol/L (ref 3.5–5.1)
SODIUM: 137 mmol/L (ref 135–145)
TOTAL PROTEIN: 6.6 g/dL (ref 6.5–8.1)
Total Bilirubin: 0.6 mg/dL (ref 0.3–1.2)

## 2015-07-30 MED ORDER — SUCRALFATE 1 G PO TABS
1.0000 g | ORAL_TABLET | Freq: Once | ORAL | Status: AC
  Administered 2015-07-30: 1 g via ORAL
  Filled 2015-07-30: qty 1

## 2015-07-30 MED ORDER — SUCRALFATE 1 G PO TABS: 1.0000 g | ORAL_TABLET | Freq: Three times a day (TID) | ORAL | Status: DC

## 2015-07-30 MED ORDER — DOXYLAMINE-PYRIDOXINE 10-10 MG PO TBEC: DELAYED_RELEASE_TABLET | ORAL | Status: DC

## 2015-07-30 MED ORDER — ACETAMINOPHEN 325 MG PO TABS
650.0000 mg | ORAL_TABLET | Freq: Once | ORAL | Status: AC
  Administered 2015-07-30: 650 mg via ORAL
  Filled 2015-07-30: qty 2

## 2015-07-30 NOTE — ED Notes (Signed)
[facility]. She felt it was related to her nausea medications which she stopped taking but still having chest pain and sob. Also has tingling to hands, appears to be hyperventilating at triage. Pt is approx [redacted] weeks pregnant.

## 2015-07-30 NOTE — MAU Note (Signed)
Patient states she was put on phenergan for nausea in pregnancy and she thinks it is causing chest pain for past 5 days.  Denies SOB.

## 2015-07-30 NOTE — Progress Notes (Signed)
E Signature page not working in room 7.  Patient signed printed copy.

## 2015-07-30 NOTE — ED Provider Notes (Signed)
Medical screening examination/treatment/procedure(s) were conducted as a shared visit with non-physician practitioner(s) and myself.  I personally evaluated the patient during the encounter.   EKG Interpretation   Date/Time:  Saturday July 30 2015 17:05:10 EDT Ventricular Rate:  96 PR Interval:  118 QRS Duration: 80 QT Interval:  346 QTC Calculation: 437 R Axis:   76 Text Interpretation:  Normal sinus rhythm Normal ECG No significant change  since last tracing Confirmed by Tonesha Tsou  MD, Saniya Tranchina [phone]) on 07/30/2015  8:01:57 PM       Results for orders placed or performed during the hospital encounter of 07/30/15  CBC with Differential/Platelet  Result Value Ref Range   WBC 9.1 4.0 - 10.5 K/uL   RBC 4.60 3.87 - 5.11 MIL/uL   Hemoglobin 12.8 12.0 - 15.0 g/dL   HCT 39.2 36.0 - 46.0 %   MCV 85.2 78.0 - 100.0 fL   MCH 27.8 26.0 - 34.0 pg   MCHC 32.7 30.0 - 36.0 g/dL   RDW 13.0 11.5 - 15.5 %   Platelets 221 150 - 400 K/uL   Neutrophils Relative % 65 %   Neutro Abs 6.0 1.7 - 7.7 K/uL   Lymphocytes Relative 30 %   Lymphs Abs 2.8 0.7 - 4.0 K/uL   Monocytes Relative 5 %   Monocytes Absolute 0.4 0.1 - 1.0 K/uL   Eosinophils Relative 0 %   Eosinophils Absolute 0.0 0.0 - 0.7 K/uL   Basophils Relative 0 %   Basophils Absolute 0.0 0.0 - 0.1 K/uL  Comprehensive metabolic panel  Result Value Ref Range   Sodium 137 135 - 145 mmol/L   Potassium 3.9 3.5 - 5.1 mmol/L   Chloride 108 101 - 111 mmol/L   CO2 19 (L) 22 - 32 mmol/L   Glucose, Bld 96 65 - 99 mg/dL   BUN 6 6 - 20 mg/dL   Creatinine, Ser 0.80 0.44 - 1.00 mg/dL   Calcium 10.1 8.9 - 10.3 mg/dL   Total Protein 6.6 6.5 - 8.1 g/dL   Albumin 3.9 3.5 - 5.0 g/dL   AST 22 15 - 41 U/L   ALT 16 14 - 54 U/L   Alkaline Phosphatase 45 38 - 126 U/L   Total Bilirubin 0.6 0.3 - 1.2 mg/dL   GFR calc non Af Amer >60 >60 mL/min   GFR calc Af Amer >60 >60 mL/min   Anion gap 10 5 - 15    Patient with one-week history of left-sided  intermittent chest pain. The check she correlates to the taking of Phenergan when she holds off it doesn't occur possible could be some reflux causing the chest pain. Patient was concerned it was an allergic reaction not really consistent with that. Patient's EKGs 2 today without any acute findings. Patient's not tachycardic is no hypoxia patient's lungs are clear bilaterally heart regular rate and rhythm. No leg swelling. Abdomen soft and nontender. Clinically no real concern for pulmonary embolus.  Patient is currently [redacted] weeks pregnant. [address] minimize doing chest x-rays. I think clinically she is safe to her discharge home and follow-up with OB/GYN.  Fredia Sorrow, MD 07/30/15 2011

## 2015-07-30 NOTE — Discharge Instructions (Signed)
Please read and follow all provided instructions.  Your diagnoses today include:  1. Chest pain, unspecified chest pain type   2. Non-intractable vomiting with nausea, vomiting of unspecified type    Tests performed today include:  Blood counts and electrolytes  EKG - unchanged from previous  Vital signs. See below for your results today.   Medications prescribed:   Carafate - for stomach upset and to protect your stomach  Take any prescribed medications only as directed.  Home care instructions:  Follow any educational materials contained in this packet.  Follow-up instructions: Please follow-up with your primary care provider in the next 3 days for further evaluation of your symptoms.   Return instructions:   Please return to the Emergency Department if you experience worsening symptoms.   Return with worsening chest pain, persistent vomiting, trouble breathing, passing out episode.   Please return if you have any other emergent concerns.  Additional Information:  Your vital signs today were: BP 91/55 mmHg   Pulse 78   Temp(Src) 97.6 F (36.4 C) (Oral)   Resp 15   SpO2 100%   LMP 06/07/2015 If your blood pressure (BP) was elevated above 135/85 this visit, please have this repeated by your doctor within one month. --------------

## 2015-07-30 NOTE — Discharge Instructions (Signed)
Nausea medication to take during pregnancy:   Unisom (doxylamine succinate 25 mg tablets) Take one tablet daily at bedtime. If symptoms are not adequately controlled, the dose can be increased to a maximum recommended dose of two tablets daily (1/2 tablet in the morning, 1/2 tablet mid-afternoon and one at bedtime).  Vitamin B6 100mg  tablets. Take one tablet twice a day (up to 200 mg per day).    Morning Sickness Morning sickness is when you feel sick to your stomach (nauseous) during pregnancy. This nauseous feeling may or may not come with vomiting. It often occurs in the morning but can be a problem any time of day. Morning sickness is most common during the first trimester, but it may continue throughout pregnancy. While morning sickness is unpleasant, it is usually harmless unless you develop severe and continual vomiting (hyperemesis gravidarum). This condition requires more intense treatment.  CAUSES  The cause of morning sickness is not completely known but seems to be related to normal hormonal changes that occur in pregnancy. RISK FACTORS You are at greater risk if you:  Experienced nausea or vomiting before your pregnancy.  Had morning sickness during a previous pregnancy.  Are pregnant with more than one baby, such as twins. TREATMENT  Do not use any medicines (prescription, over-the-counter, or herbal) for morning sickness without first talking to your health care provider. Your health care provider may prescribe or recommend:  Vitamin B6 supplements.  Anti-nausea medicines.  The herbal medicine ginger. HOME CARE INSTRUCTIONS   Only take over-the-counter or prescription medicines as directed by your health care provider.  Taking multivitamins before getting pregnant can prevent or decrease the severity of morning sickness in most women.  Eat a piece of dry toast or unsalted crackers before getting out of bed in the morning.  Eat five or six small meals a day.  Eat  dry and bland foods (rice, baked potato). Foods high in carbohydrates are often helpful.  Do not drink liquids with your meals. Drink liquids between meals.  Avoid greasy, fatty, and spicy foods.  Get someone to cook for you if the smell of any food causes nausea and vomiting.  If you feel nauseous after taking prenatal vitamins, take the vitamins at night or with a snack.  Snack on protein foods (nuts, yogurt, cheese) between meals if you are hungry.  Eat unsweetened gelatins for desserts.  Wearing an acupressure wristband (worn for sea sickness) may be helpful.  Acupuncture may be helpful.  Do not smoke.  Get a humidifier to keep the air in your house free of odors.  Get plenty of fresh air. SEEK MEDICAL CARE IF:   Your home remedies are not working, and you need medicine.  You feel dizzy or lightheaded.  You are losing weight. SEEK IMMEDIATE MEDICAL CARE IF:   You have persistent and uncontrolled nausea and vomiting.  You pass out (faint). MAKE SURE YOU:  Understand these instructions.  Will watch your condition.  Will get help right away if you are not doing well or get worse.   This information is not intended to replace advice given to you by your health care provider. Make sure you discuss any questions you have with your health care provider.   Document Released: 03/15/2006 Document Revised: 01/27/2013 Document Reviewed: 07/09/2012 Elsevier Interactive Patient Education [employer].

## 2015-07-30 NOTE — MAU Provider Note (Signed)
Chief Complaint: Morning Sickness and Chest Pain   First Provider Initiated Contact with Patient 07/30/15 1406      SUBJECTIVE HPI: [name] is a 25 y.o. G2P0010 at [redacted]w[redacted]d by LMP who presents to maternity admissions reporting the Phenergan she was prescribed for her n/v of pregnancy is causing chest pain. She reports she cannot take Reglan because it conflicts with her Latuda medication and her insurance will not pay for Zofran.  She was started on Phenergan 5 days ago and reports the onset of sharp intermittent pain in her mid chest as soon as she takes the medication, that resolves completely when the medicine wears off. She last took a dose 2-3 hours ago and reports the chest pain is present upon arrival but beginning to improve.  She denies shortness of breath, abdominal pain, vaginal bleeding, vaginal itching/burning, urinary symptoms, h/a, dizziness, or fever/chills.    She was seen in the ED a few days ago for hallucinations and hearing voices telling her to hurt herself but denies any of these symptoms today. She reports she feels safe and denies interest or plan to cause herself or others any harm.  HPI  Past Medical History  Diagnosis Date  . Asthma   . Depression   . Schizoaffective disorder, bipolar type ([place])   . Autism   . Essential tremor   . Chronic constipation   . Acid reflux   . Drug-seeking behavior   . Seizures ([place])   . Schizo-affective psychosis ([place])   . Personality disorder    Past Surgical History  Procedure Laterality Date  . Mouth surgery     Social History   Social History  . Marital Status: Married    Spouse Name: N/A  . Number of Children: 0  . Years of Education: N/A   Occupational History  . disability    Social History Main Topics  . Smoking status: Former Smoker -- 0.00 packs/day  . Smokeless tobacco: Not on file     Comment: Smoked for 2  years age [age]  . Alcohol Use: 0.6 oz/week    1 Standard drinks or equivalent per week  .  Drug Use: No     Comment: History of cocaine use at age 21 for 4 months  . Sexual Activity: Yes    Birth Control/ Protection: None   Other Topics Concern  . Not on file   Social History Narrative   Marital status: married; guardian is Furniture conservator/restorer      Children: none      Lives: with husband in apartment      Employment:  Disability      Tobacco: quit smoking; smoked for two years.      Alcohol ;none      Drugs: none           No current facility-administered medications on file prior to encounter.   Current Outpatient Prescriptions on File Prior to Encounter  Medication Sig Dispense Refill  . acetaminophen (TYLENOL) 325 MG tablet Take 650 mg by mouth every 6 (six) hours as needed for moderate pain.    Marland Kitchen albuterol (PROVENTIL HFA;VENTOLIN HFA) 108 (90 Base) MCG/ACT inhaler Inhale 1-2 puffs into the lungs every 6 (six) hours as needed for wheezing or shortness of breath. Reported on 05/05/2015 1 Inhaler 1  . cholecalciferol (VITAMIN D-400) 400 UNITS TABS tablet Take 800 Units by mouth daily.     . fluticasone-salmeterol (ADVAIR HFA) 115-21 MCG/ACT inhaler Inhale 2 puffs into the lungs  2 (two) times daily. 1 Inhaler 3  . levETIRAcetam (KEPPRA) 500 MG tablet Take 1 tablet (500 mg total) by mouth 2 (two) times daily. 60 tablet 5  . lurasidone (LATUDA) 40 MG TABS tablet Take 1 tablet (40 mg total) by mouth daily with supper. 30 tablet 0  . Menthol, Topical Analgesic, (BENGAY EX) Apply 1 application topically daily as needed (pain).     . Prenatal Vit-Fe Fumarate-FA (PRENATAL MULTIVITAMIN) TABS tablet Take 1 tablet by mouth at bedtime. (Patient taking differently: Take 1 tablet by mouth at bedtime. ) 30 tablet 0  . senna (SENOKOT) 8.6 MG tablet Take 2 tablets by mouth at bedtime.     Allergies  Allergen Reactions  . Bee Venom Anaphylaxis  . Coconut Flavor Anaphylaxis  . Geodon [Ziprasidone Hcl] Other (See Comments)    Paralysis of mouth  . Haloperidol And Related Other (See Comments)     Paralysis of mouth   . Lithium Other (See Comments)    Seizure like muscle spasms   . Oxycodone Other (See Comments)    hallucinations   . Seroquel [Quetiapine Fumarate] Other (See Comments)    TOO STRONG  . Prilosec [Omeprazole] Nausea And Vomiting  . Percocet [Oxycodone-Acetaminophen] Other (See Comments)    Causes hallucinations.  . Shellfish Allergy Swelling  . Tegretol [Carbamazepine] Nausea And Vomiting    ROS:  Review of Systems  Constitutional: Negative for fever, chills and fatigue.  Respiratory: Positive for chest tightness. Negative for shortness of breath.   Cardiovascular: Positive for chest pain.  Gastrointestinal: Positive for nausea and vomiting. Negative for abdominal pain.       Pt reports vomiting 4-5x daily  Genitourinary: Negative for dysuria, flank pain, vaginal bleeding, vaginal discharge, difficulty urinating, vaginal pain and pelvic pain.  Neurological: Negative for dizziness and headaches.  Psychiatric/Behavioral: Negative.  Negative for suicidal ideas, hallucinations and agitation. The patient is not nervous/anxious.      I have reviewed patient's Past Medical Hx, Surgical Hx, Family Hx, Social Hx, medications and allergies.   Physical Exam   Patient Vitals for the past 24 hrs:  BP Temp Temp src Pulse Resp SpO2  07/30/15 1444 103/65 mmHg - - 94 16 -  07/30/15 1315 - - - - - 97 %  07/30/15 1237 123/59 mmHg 98.2 F (36.8 C) Oral 95 18 -  07/30/15 1236 - - - - - 100 %   Constitutional: Well-developed, well-nourished female in no acute distress.  HEART: normal rate, heart sounds, regular rhythm RESP: normal effort, lung sounds clear and equal bilaterally GI: Abd soft, non-tender. Pos BS x 4 MS: Extremities nontender, no edema, normal ROM Neurologic: Alert and oriented x 4.  GU: Neg CVAT.  EKG: normal EKG, sinus arrythmia noted    MAU Management/MDM: With normal EKG and heart/lung assessment, and well-appearing pt, no evidence of acute  cardiac event. Pt pain is also intermittent, resolving spontaneously when pt not taking Phenergan. Likely Phenergan is causing reaction for pt and is discontinued. Rx for Diclegis written and message sent to [place] to fill out prior-auth forms.  Discussed Unisom/B6 with pt to take until insurance covers Diclegis.  Pt states understanding.  Precautions given/reasons to return to ED or MAU.  Pt stable at time of discharge.  ASSESSMENT 1. Adverse drug reaction, initial encounter   2. Chest pain, unspecified chest pain type     PLAN Discharge home Keep scheduled appt in [place] on July 3    Medication List    STOP  taking these medications        ondansetron 4 MG disintegrating tablet  Commonly known as:  ZOFRAN ODT     promethazine 25 MG tablet  Commonly known as:  PHENERGAN      TAKE these medications        acetaminophen 325 MG tablet  Commonly known as:  TYLENOL  Take 650 mg by mouth every 6 (six) hours as needed for moderate pain.     albuterol 108 (90 Base) MCG/ACT inhaler  Commonly known as:  PROVENTIL HFA;VENTOLIN HFA  Inhale 1-2 puffs into the lungs every 6 (six) hours as needed for wheezing or shortness of breath. Reported on 05/05/2015     BENGAY EX  Apply 1 application topically daily as needed (pain).     Doxylamine-Pyridoxine 10-10 MG Tbec  Commonly known as:  DICLEGIS  Take 2 tabs at bedtime. If needed, add another tab in the morning. If needed, add another tab in the afternoon, up to 4 tabs/day.     fluticasone-salmeterol 115-21 MCG/ACT inhaler  Commonly known as:  ADVAIR HFA  Inhale 2 puffs into the lungs 2 (two) times daily.     levETIRAcetam 500 MG tablet  Commonly known as:  KEPPRA  Take 1 tablet (500 mg total) by mouth 2 (two) times daily.     lurasidone 40 MG Tabs tablet  Commonly known as:  LATUDA  Take 1 tablet (40 mg total) by mouth daily with supper.     prenatal multivitamin Tabs tablet  Take 1 tablet by mouth at bedtime.     senna 8.6 MG tablet   Commonly known as:  SENOKOT  Take 2 tablets by mouth at bedtime.     VITAMIN D-400 400 units Tabs tablet  Generic drug:  cholecalciferol  Take 800 Units by mouth daily.       Follow-up Information    Follow up with [facility].   Specialty:  Obstetrics and Gynecology   Why:  As scheduled, Return to MAU as needed for emergencies   Contact information:   [place] Certified Nurse-Midwife 07/30/2015  3:34 PM

## 2015-07-30 NOTE — ED Provider Notes (Signed)
CSN: [id number]     Arrival date & time 07/30/15  1659 History   First MD Initiated Contact with Patient 07/30/15 1705     Chief Complaint  Patient presents with  . Chest Pain  . Shortness of Breath     (Consider location/radiation/quality/duration/timing/severity/associated sxs/prior Treatment) HPI Comments: Patient who is currently approximately [redacted] weeks pregnant presents with complaint of intermittent sharp left upper chest pains with associated dizziness, shortness of breath, tingling in her fingers and toes. Patient was seen at the [facility] earlier today with same complaint as well as vomiting. Patient has had intermittent vomiting over the past several weeks related to her pregnancy.  Patient states that she feels short of breath and lightheaded/dizzy. Symptoms are worse with ambulation. Not made worse with eating. No lower extremity swelling or history of blood clots. No syncope. The onset of this condition was acute. The course is constant. Aggravating factors: none. Alleviating factors: none.   Recently seen for SI and patient has history of drug-seeking behavior.    Patient is a 25 y.o. female presenting with chest pain and shortness of breath. The history is provided by the patient.  Chest Pain Associated symptoms: dizziness, nausea, shortness of breath and vomiting   Associated symptoms: no abdominal pain, no cough, no fever, no headache, no numbness (+ paresthesias) and no palpitations   Shortness of Breath Associated symptoms: chest pain and vomiting   Associated symptoms: no abdominal pain, no cough, no fever, no headaches, no rash and no sore throat     Past Medical History  Diagnosis Date  . Asthma   . Depression   . Schizoaffective disorder, bipolar type ([place])   . Autism   . Essential tremor   . Chronic constipation   . Acid reflux   . Drug-seeking behavior   . Seizures ([place])   . Schizo-affective psychosis ([place])   . Personality disorder    Past Surgical  History  Procedure Laterality Date  . Mouth surgery     Family History  Problem Relation Age of Onset  . Mental illness Father   . Asthma Father   . PDD Brother   . Seizures Brother    Social History  Substance Use Topics  . Smoking status: Former Smoker -- 0.00 packs/day  . Smokeless tobacco: None     Comment: Smoked for 2  years age [age]  . Alcohol Use: 0.6 oz/week    1 Standard drinks or equivalent per week   OB History    Gravida Para Term Preterm AB TAB SAB Ectopic Multiple Living   2    1  1         Review of Systems  Constitutional: Negative for fever.  HENT: Negative for rhinorrhea and sore throat.   Eyes: Negative for redness.  Respiratory: Positive for shortness of breath. Negative for cough.   Cardiovascular: Positive for chest pain. Negative for palpitations and leg swelling.  Gastrointestinal: Positive for nausea and vomiting. Negative for abdominal pain and diarrhea.  Genitourinary: Negative for dysuria.  Musculoskeletal: Negative for myalgias.  Skin: Negative for rash.  Neurological: Positive for dizziness and light-headedness. Negative for numbness (+ paresthesias) and headaches.    Allergies  Bee venom; Coconut flavor; Geodon; Haloperidol and related; Lithium; Oxycodone; Seroquel; Prilosec; Percocet; Shellfish allergy; and Tegretol  Home Medications   Prior to Admission medications   Medication Sig Start Date End Date Taking? Authorizing Provider  acetaminophen (TYLENOL) 325 MG tablet Take 650 mg by mouth every 6 (six)  hours as needed for moderate pain.    Historical Provider, MD  albuterol (PROVENTIL HFA;VENTOLIN HFA) 108 (90 Base) MCG/ACT inhaler Inhale 1-2 puffs into the lungs every 6 (six) hours as needed for wheezing or shortness of breath. Reported on 05/05/2015 07/20/15   Wendie Agreste, MD  cholecalciferol (VITAMIN D-400) 400 UNITS TABS tablet Take 800 Units by mouth daily.     Historical Provider, MD  Doxylamine-Pyridoxine (DICLEGIS) 10-10 MG  TBEC Take 2 tabs at bedtime. If needed, add another tab in the morning. If needed, add another tab in the afternoon, up to 4 tabs/day. 07/30/15   Kathie Dike Leftwich-Kirby, CNM  fluticasone-salmeterol (ADVAIR HFA) 115-21 MCG/ACT inhaler Inhale 2 puffs into the lungs 2 (two) times daily. 07/20/15   Wendie Agreste, MD  levETIRAcetam (KEPPRA) 500 MG tablet Take 1 tablet (500 mg total) by mouth 2 (two) times daily. 07/27/15   Britt Bottom, MD  lurasidone (LATUDA) 40 MG TABS tablet Take 1 tablet (40 mg total) by mouth daily with supper. 07/18/15   Hildred Priest, MD  Menthol, Topical Analgesic, (BENGAY EX) Apply 1 application topically daily as needed (pain).     Historical Provider, MD  Prenatal Vit-Fe Fumarate-FA (PRENATAL MULTIVITAMIN) TABS tablet Take 1 tablet by mouth at bedtime. Patient taking differently: Take 1 tablet by mouth at bedtime.  07/18/15   Hildred Priest, MD  senna (SENOKOT) 8.6 MG tablet Take 2 tablets by mouth at bedtime.    Historical Provider, MD   BP 102/56 mmHg  Pulse 90  Temp(Src) 97.6 F (36.4 C) (Oral)  Resp 18  SpO2 94%  LMP 06/07/2015   Physical Exam  Constitutional: She appears well-developed and well-nourished.  HENT:  Head: Normocephalic and atraumatic.  Mouth/Throat: Oropharynx is clear and moist.  Eyes: Conjunctivae are normal. Right eye exhibits no discharge. Left eye exhibits no discharge.  Neck: Normal range of motion. Neck supple.  Cardiovascular: Normal rate, regular rhythm and normal heart sounds.   No murmur heard. Pulmonary/Chest: Effort normal and breath sounds normal. No respiratory distress. She has no wheezes. She has no rales. She exhibits no tenderness.  Abdominal: Soft. There is no tenderness.  Musculoskeletal: She exhibits no edema or tenderness.  Neurological: She is alert.  Skin: Skin is warm and dry.  Psychiatric: Her mood appears anxious.  Nursing note and vitals reviewed.   ED Course  Procedures (including  critical care time) Labs Review Labs Reviewed  COMPREHENSIVE METABOLIC PANEL - Abnormal; Notable for the following:    CO2 19 (*)    All other components within normal limits  CBC WITH DIFFERENTIAL/PLATELET     ED ECG REPORT   Date: 07/30/2015  Rate: 96  Rhythm: normal sinus rhythm  QRS Axis: normal  Intervals: normal  ST/T Wave abnormalities: normal  Conduction Disutrbances:none  Narrative Interpretation:   Old EKG Reviewed: unchanged from previously today  I have personally reviewed the EKG tracing and agree with the computerized printout as noted.   5:28 PM Patient seen and examined. Low concern for serious cause of her pain. Will check labs, orthostatics, and ambulate to ensure she does not desaturate. No fever or cough to suspect PNA. No other features concerning for PE. EKG is normal. Pt is not hypertensive.   Vital signs reviewed and are as follows: BP 102/56 mmHg  Pulse 90  Temp(Src) 97.6 F (36.4 C) (Oral)  Resp 18  SpO2 94%  LMP 06/07/2015  8:34 PM Patient states that she feels that her pain is  not completely resolved. Discussed all lab findings and results. Discussed that symptoms not suggestive of severe chest etiology or pneumonia, would not want to get a chest x-ray. No compelling symptoms or indications to suggest PE and will avoid imaging for this at this time. Suspect this is more GI related. Discharged home with Tylenol and Carafate for symptom control.  Patient discussed with and seen by Dr. Rogene Houston.   The patient was urged to return to the Emergency Department immediately with worsening of current symptoms, worsening abdominal pain, persistent vomiting, blood noted in stools, fever, or any other concerns. The patient verbalized understanding.    MDM   Final diagnoses:  Chest pain, unspecified chest pain type  Non-intractable vomiting with nausea, vomiting of unspecified type   Patient with chest pain in setting of pregnancy, recent vomiting. Seen  at [facility] hospital today and discharge. Symptoms improved here with Tylenol and Carafate. Lab results are reassuring. Do not suspect esophageal rupture such as Boerhaave's due to vomiting. Do not suspect pneumonia given lack of elevated WBC count, fever, cough. No other concerning features suggesting PE. Patient has ambulated without becoming hypoxic. She is not tachypneic or tachycardic. No indication for further imaging or workup at this time. Encouraged PCP follow-up. Patient has been prescribed Diclegis by her GYN.    Carlisle Cater, PA-C 07/30/15 2037

## 2015-08-03 ENCOUNTER — Ambulatory Visit (INDEPENDENT_AMBULATORY_CARE_PROVIDER_SITE_OTHER): Payer: Medicare Other | Admitting: Family Medicine

## 2015-08-03 VITALS — BP 122/72 | HR 76 | Temp 98.2°F | Resp 17 | Ht 64.0 in | Wt 164.0 lb

## 2015-08-03 DIAGNOSIS — J4540 Moderate persistent asthma, uncomplicated: Secondary | ICD-10-CM

## 2015-08-03 MED ORDER — FLUTICASONE-SALMETEROL 115-21 MCG/ACT IN AERO: 2.0000 | INHALATION_SPRAY | Freq: Two times a day (BID) | RESPIRATORY_TRACT | Status: DC

## 2015-08-03 NOTE — Patient Instructions (Addendum)
Great to meet you!  I will send refills of your advair.      IF you received an x-ray today, you will receive an invoice from [facility]. Please contact [facility] at [phone] with questions or concerns regarding your invoice.   IF you received labwork today, you will receive an invoice from [employer]. Please contact [facility] at [phone] with questions or concerns regarding your invoice.   Our billing staff will not be able to assist you with questions regarding bills from these companies.  You will be contacted with the lab results as soon as they are available. The fastest way to get your results is to activate your My Chart account. Instructions are located on the last page of this paperwork. If you have not heard from [name] regarding the results in 2 weeks, please contact this office.

## 2015-08-03 NOTE — Progress Notes (Signed)
   HPI  Patient presents today here for asthma follow up   Pt states that she feels much better on the adviar. She is using 2 puffs twice daily without a problem. She has only used her rescue inhaler 2 times since starting.   She denies any SOB or chest pain today  Sh eis [redacted] weeks pregnant and has had recent issues with phenergan causing chest pain and indigestion. This is resolved an dher nausea is improving.     PMH: Smoking status noted ROS: Per HPI  Objective: BP 122/72 mmHg  Pulse 76  Temp(Src) 98.2 F (36.8 C) (Oral)  Resp 17  Ht 5\' 4"  (1.626 m)  Wt 164 lb (74.39 kg)  BMI 28.14 kg/m2  SpO2 100%  LMP 06/07/2015 Gen: NAD, alert, cooperative with exam HEENT: NCAT, oropharynx clear with large pink tonsils CV: RRR, good S1/S2, no murmur Resp: CTABL, no wheezes, non-labored Ext: No edema, warm Neuro: Alert and oriented, No gross deficits  Assessment and plan:  # Asthma Improved with advair, continue, prescribed 6 months RX Has f/u with her previous PCP in 3 months and routine GYN care planned Discussed rinsing mouth after using advair RTC with any concerns, otherwise f/u with PCP as planned.     Meds ordered this encounter  Medications  . fluticasone-salmeterol (ADVAIR HFA) 115-21 MCG/ACT inhaler    Sig: Inhale 2 puffs into the lungs 2 (two) times daily.    Dispense:  1 Inhaler    Refill:  5    Kenn File, MD 8:47 AM

## 2015-08-05 ENCOUNTER — Encounter (HOSPITAL_COMMUNITY): Payer: Self-pay | Admitting: Emergency Medicine

## 2015-08-05 ENCOUNTER — Emergency Department (HOSPITAL_COMMUNITY)
Admission: EM | Admit: 2015-08-05 | Discharge: 2015-08-05 | Disposition: A | Discharge status: Home / Self Care | Payer: Medicare Other | Attending: Dermatology | Admitting: Dermatology

## 2015-08-05 DIAGNOSIS — Z87891 Personal history of nicotine dependence: Secondary | ICD-10-CM | POA: Insufficient documentation

## 2015-08-05 DIAGNOSIS — O99511 Diseases of the respiratory system complicating pregnancy, first trimester: Secondary | ICD-10-CM | POA: Diagnosis present

## 2015-08-05 DIAGNOSIS — Z3A01 Less than 8 weeks gestation of pregnancy: Secondary | ICD-10-CM | POA: Insufficient documentation

## 2015-08-05 DIAGNOSIS — Z5321 Procedure and treatment not carried out due to patient leaving prior to being seen by health care provider: Secondary | ICD-10-CM | POA: Diagnosis not present

## 2015-08-05 DIAGNOSIS — R064 Hyperventilation: Secondary | ICD-10-CM | POA: Diagnosis not present

## 2015-08-05 MED ORDER — ALBUTEROL SULFATE (2.5 MG/3ML) 0.083% IN NEBU
5.0000 mg | INHALATION_SOLUTION | Freq: Once | RESPIRATORY_TRACT | Status: AC
  Administered 2015-08-05: 5 mg via RESPIRATORY_TRACT

## 2015-08-05 MED ORDER — ALBUTEROL SULFATE (2.5 MG/3ML) 0.083% IN NEBU
INHALATION_SOLUTION | RESPIRATORY_TRACT | Status: AC
  Administered 2015-08-05: 5 mg via RESPIRATORY_TRACT
  Filled 2015-08-05: qty 6

## 2015-08-05 NOTE — ED Notes (Addendum)
PT arrived to triage hyperventilating.  States she started having an "asthma attack" while doing a "vigourous activity" and doesn't want to talk about it.  PT encouraged to take slow, deep breaths.  Very faint wheezing noted to lower lungs.  Pt denies pain.  States she is [redacted] weeks pregnant.

## 2015-08-06 ENCOUNTER — Inpatient Hospital Stay (HOSPITAL_COMMUNITY)
Admission: AD | Admit: 2015-08-06 | Discharge: 2015-08-06 | Disposition: A | Discharge status: Home / Self Care | Payer: Medicare Other | Source: Ambulatory Visit | Attending: Obstetrics & Gynecology | Admitting: Obstetrics & Gynecology

## 2015-08-06 ENCOUNTER — Encounter (HOSPITAL_COMMUNITY): Payer: Self-pay

## 2015-08-06 DIAGNOSIS — O99611 Diseases of the digestive system complicating pregnancy, first trimester: Secondary | ICD-10-CM | POA: Insufficient documentation

## 2015-08-06 DIAGNOSIS — K219 Gastro-esophageal reflux disease without esophagitis: Secondary | ICD-10-CM | POA: Diagnosis not present

## 2015-08-06 DIAGNOSIS — O26891 Other specified pregnancy related conditions, first trimester: Secondary | ICD-10-CM | POA: Diagnosis not present

## 2015-08-06 DIAGNOSIS — F259 Schizoaffective disorder, unspecified: Secondary | ICD-10-CM | POA: Diagnosis not present

## 2015-08-06 DIAGNOSIS — Z87891 Personal history of nicotine dependence: Secondary | ICD-10-CM | POA: Insufficient documentation

## 2015-08-06 DIAGNOSIS — O99511 Diseases of the respiratory system complicating pregnancy, first trimester: Secondary | ICD-10-CM | POA: Diagnosis not present

## 2015-08-06 DIAGNOSIS — K5900 Constipation, unspecified: Secondary | ICD-10-CM | POA: Diagnosis present

## 2015-08-06 DIAGNOSIS — G250 Essential tremor: Secondary | ICD-10-CM | POA: Insufficient documentation

## 2015-08-06 DIAGNOSIS — O99341 Other mental disorders complicating pregnancy, first trimester: Secondary | ICD-10-CM | POA: Insufficient documentation

## 2015-08-06 DIAGNOSIS — F840 Autistic disorder: Secondary | ICD-10-CM | POA: Insufficient documentation

## 2015-08-06 DIAGNOSIS — J45909 Unspecified asthma, uncomplicated: Secondary | ICD-10-CM | POA: Insufficient documentation

## 2015-08-06 DIAGNOSIS — Z3A08 8 weeks gestation of pregnancy: Secondary | ICD-10-CM | POA: Insufficient documentation

## 2015-08-06 DIAGNOSIS — K5909 Other constipation: Secondary | ICD-10-CM | POA: Diagnosis not present

## 2015-08-06 MED ORDER — FLEET ENEMA 7-19 GM/118ML RE ENEM
1.0000 | ENEMA | Freq: Once | RECTAL | Status: AC
  Administered 2015-08-06: 1 via RECTAL

## 2015-08-06 NOTE — MAU Provider Note (Signed)
History   [id number]   Chief Complaint  Patient presents with  . Constipation    HPI [name] is a 25 y.o. female  G2P0010 at [redacted]w[redacted]d IUP here with report of being constipated x 3 days.  Tried Senakot with no relief.  Denies pelvic pain or vaginal bleeding.  +nausea, without vomiting.  History of chronic constipation.  Patient's last menstrual period was 06/07/2015.  OB History  Gravida Para Term Preterm AB SAB TAB Ectopic Multiple Living  2    1 1         # Outcome Date GA Lbr Len/2nd Weight Sex Delivery Anes PTL Lv  2 Current           1 SAB 2010              Past Medical History  Diagnosis Date  . Asthma   . Depression   . Schizoaffective disorder, bipolar type ([place])   . Autism   . Essential tremor   . Chronic constipation   . Acid reflux   . Drug-seeking behavior   . Seizures ([place])   . Schizo-affective psychosis ([place])   . Personality disorder     Family History  Problem Relation Age of Onset  . Mental illness Father   . Asthma Father   . PDD Brother   . Seizures Brother     Social History   Social History  . Marital Status: Married    Spouse Name: N/A  . Number of Children: 0  . Years of Education: N/A   Occupational History  . disability    Social History Main Topics  . Smoking status: Former Smoker -- 0.00 packs/day  . Smokeless tobacco: None     Comment: Smoked for 2  years age [age]  . Alcohol Use: 0.6 oz/week    1 Standard drinks or equivalent per week     Comment: "not with preg"  . Drug Use: No     Comment: History of cocaine use at age 24 for 4 months  . Sexual Activity: Yes    Birth Control/ Protection: None   Other Topics Concern  . None   Social History Narrative   Marital status: married; guardian is Furniture conservator/restorer      Children: none      Lives: with husband in apartment      Employment:  Disability      Tobacco: quit smoking; smoked for two years.      Alcohol ;none      Drugs: none            Allergies  Allergen  Reactions  . Bee Venom Anaphylaxis  . Coconut Flavor Anaphylaxis  . Geodon [Ziprasidone Hcl] Other (See Comments)    Paralysis of mouth  . Haloperidol And Related Other (See Comments)    Paralysis of mouth   . Lithium Other (See Comments)    Seizure like muscle spasms   . Oxycodone Other (See Comments)    hallucinations   . Seroquel [Quetiapine Fumarate] Other (See Comments)    TOO STRONG  . Percocet [Oxycodone-Acetaminophen] Other (See Comments)    Causes hallucinations.  . Prilosec [Omeprazole] Nausea And Vomiting  . Shellfish Allergy Swelling  . Tegretol [Carbamazepine] Nausea And Vomiting  . Phenergan [Promethazine Hcl] Other (See Comments)    Chest pain      No current facility-administered medications on file prior to encounter.   Current Outpatient Prescriptions on File Prior to Encounter  Medication Sig Dispense Refill  .  acetaminophen (TYLENOL) 325 MG tablet Take 650 mg by mouth every 6 (six) hours as needed for moderate pain.    Marland Kitchen albuterol (PROVENTIL HFA;VENTOLIN HFA) 108 (90 Base) MCG/ACT inhaler Inhale 1-2 puffs into the lungs every 6 (six) hours as needed for wheezing or shortness of breath. Reported on 05/05/2015 1 Inhaler 1  . cholecalciferol (VITAMIN D-400) 400 UNITS TABS tablet Take 800 Units by mouth daily.     Marland Kitchen levETIRAcetam (KEPPRA) 500 MG tablet Take 1 tablet (500 mg total) by mouth 2 (two) times daily. 60 tablet 5  . lurasidone (LATUDA) 40 MG TABS tablet Take 1 tablet (40 mg total) by mouth daily with supper. 30 tablet 0  . Menthol, Topical Analgesic, (BENGAY EX) Apply 1 application topically daily as needed (pain).     . Prenatal Vit-Fe Fumarate-FA (PRENATAL MULTIVITAMIN) TABS tablet Take 1 tablet by mouth at bedtime. (Patient taking differently: Take 1 tablet by mouth at bedtime. ) 30 tablet 0  . senna (SENOKOT) 8.6 MG tablet Take 2 tablets by mouth at bedtime.       Review of Systems  Constitutional: Negative for fever and chills.   Gastrointestinal: Positive for nausea and constipation. Negative for vomiting, abdominal pain, diarrhea, blood in stool, anal bleeding and rectal pain.  Genitourinary: Negative for dysuria, frequency, vaginal bleeding and vaginal discharge.     Physical Exam   Filed Vitals:   08/06/15 0509  BP: 110/53  Pulse: 93  Temp: 98.7 F (37.1 C)  TempSrc: Oral  Resp: 20  SpO2: 96%    Physical Exam  Constitutional: She is oriented to person, place, and time. She appears well-developed and well-nourished.  HENT:  Head: Normocephalic.  Neck: Normal range of motion. Neck supple.  Cardiovascular: Normal rate, regular rhythm and normal heart sounds.   Respiratory: Effort normal and breath sounds normal. No respiratory distress.  GI: Soft. There is no tenderness.  Genitourinary: No bleeding in the vagina.  Musculoskeletal: Normal range of motion. She exhibits no edema.  Neurological: She is alert and oriented to person, place, and time.  Skin: Skin is warm and dry.    MAU Course  Procedures  MDM Fleets enema > patient reports good relief, large amount of stool  Assessment and Plan  25 y.o. G2P0010 at [redacted]w[redacted]d IUP  Constipation  Plan: Given info of increased fiber Keep scheduled appt with Dr. Elly Modena   Gwen Pounds, CNM 08/06/2015 6:12 AM

## 2015-08-06 NOTE — MAU Note (Signed)
Constipated for 3 days.  Was told Colace was not safe.  Taking Senakot but didn't help.  No bleeding. Abd pain from constipation, states "knows the difference"

## 2015-08-06 NOTE — Discharge Instructions (Signed)
Constipation, Adult °Constipation is when a person has fewer than three bowel movements a week, has difficulty having a bowel movement, or has stools that are dry, hard, or larger than normal. As people grow older, constipation is more common. A low-fiber diet, not taking in enough fluids, and taking certain medicines may make constipation worse.  °CAUSES  °· Certain medicines, such as antidepressants, pain medicine, iron supplements, antacids, and water pills.   °· Certain diseases, such as diabetes, irritable bowel syndrome (IBS), thyroid disease, or depression.   °· Not drinking enough water.   °· Not eating enough fiber-rich foods.   °· Stress or travel.   °· Lack of physical activity or exercise.   °· Ignoring the urge to have a bowel movement.   °· Using laxatives too much.   °SIGNS AND SYMPTOMS  °· Having fewer than three bowel movements a week.   °· Straining to have a bowel movement.   °· Having stools that are hard, dry, or larger than normal.   °· Feeling full or bloated.   °· Pain in the lower abdomen.   °· Not feeling relief after having a bowel movement.   °DIAGNOSIS  °Your health care provider will take a medical history and perform a physical exam. Further testing may be done for severe constipation. Some tests may include: °· A barium enema X-ray to examine your rectum, colon, and, sometimes, your small intestine.   °· A sigmoidoscopy to examine your lower colon.   °· A colonoscopy to examine your entire colon. °TREATMENT  °Treatment will depend on the severity of your constipation and what is causing it. Some dietary treatments include drinking more fluids and eating more fiber-rich foods. Lifestyle treatments may include regular exercise. If these diet and lifestyle recommendations do not help, your health care provider may recommend taking over-the-counter laxative medicines to help you have bowel movements. Prescription medicines may be prescribed if over-the-counter medicines do not work.    °HOME CARE INSTRUCTIONS  °· Eat foods that have a lot of fiber, such as fruits, vegetables, whole grains, and beans. °· Limit foods high in fat and processed sugars, such as french fries, hamburgers, cookies, candies, and soda.   °· A fiber supplement may be added to your diet if you cannot get enough fiber from foods.   °· Drink enough fluids to keep your urine clear or pale yellow.   °· Exercise regularly or as directed by your health care provider.   °· Go to the restroom when you have the urge to go. Do not hold it.   °· Only take over-the-counter or prescription medicines as directed by your health care provider. Do not take other medicines for constipation without talking to your health care provider first.   °SEEK IMMEDIATE MEDICAL CARE IF:  °· You have bright red blood in your stool.   °· Your constipation lasts for more than 4 days or gets worse.   °· You have abdominal or rectal pain.   °· You have thin, pencil-like stools.   °· You have unexplained weight loss. °MAKE SURE YOU:  °· Understand these instructions. °· Will watch your condition. °· Will get help right away if you are not doing well or get worse. °  °This information is not intended to replace advice given to you by your health care provider. Make sure you discuss any questions you have with your health care provider. °  °Document Released: 10/21/2003 Document Revised: 02/12/2014 Document Reviewed: 11/03/2012 °Elsevier Interactive Patient Education ©2016 [employer] ° °High-Fiber Diet °Fiber, also called dietary fiber, is a type of carbohydrate found in fruits, vegetables, whole grains, and   beans. A high-fiber diet can have many health benefits. Your health care provider may recommend a high-fiber diet to help: °· Prevent constipation. Fiber can make your bowel movements more regular. °· Lower your cholesterol. °· Relieve hemorrhoids, uncomplicated diverticulosis, or irritable bowel syndrome. °· Prevent overeating as part of a weight-loss  plan. °· Prevent heart disease, type 2 diabetes, and certain cancers. °WHAT IS MY PLAN? °The recommended daily intake of fiber includes: °· 38 grams for men under age 50. °· 30 grams for men over age 50. °· 25 grams for women under age 50. °· 21 grams for women over age 50. °You can get the recommended daily intake of dietary fiber by eating a variety of fruits, vegetables, grains, and beans. Your health care provider may also recommend a fiber supplement if it is not possible to get enough fiber through your diet. °WHAT DO I NEED TO KNOW ABOUT A HIGH-FIBER DIET? °· Fiber supplements have not been widely studied for their effectiveness, so it is better to get fiber through food sources. °· Always check the fiber content on the nutrition facts label of any prepackaged food. Look for foods that contain at least 5 grams of fiber per serving. °· Ask your dietitian if you have questions about specific foods that are related to your condition, especially if those foods are not listed in the following section. °· Increase your daily fiber consumption gradually. Increasing your intake of dietary fiber too quickly may cause bloating, cramping, or gas. °· Drink plenty of water. Water helps you to digest fiber. °WHAT FOODS CAN I EAT? °Grains °Whole-grain breads. Multigrain cereal. Oats and oatmeal. Brown rice. Barley. Bulgur wheat. Millet. Bran muffins. Popcorn. Rye wafer crackers. °Vegetables °Sweet potatoes. Spinach. Kale. Artichokes. Cabbage. Broccoli. Green peas. Carrots. Squash. °Fruits °Berries. Pears. Apples. Oranges. Avocados. Prunes and raisins. Dried figs. °Meats and Other Protein Sources °Navy, kidney, pinto, and soy beans. Split peas. Lentils. Nuts and seeds. °Dairy °Fiber-fortified yogurt. °Beverages °Fiber-fortified soy milk. Fiber-fortified orange juice. °Other °Fiber bars. °The items listed above may not be a complete list of recommended foods or beverages. Contact your dietitian for more options. °WHAT FOODS  ARE NOT RECOMMENDED? °Grains °White bread. Pasta made with refined flour. White rice. °Vegetables °Fried potatoes. Canned vegetables. Well-cooked vegetables.  °Fruits °Fruit juice. Cooked, strained fruit. °Meats and Other Protein Sources °Fatty cuts of meat. Fried poultry or fried fish. °Dairy °Milk. Yogurt. Cream cheese. Sour cream. °Beverages °Soft drinks. °Other °Cakes and pastries. Butter and oils. °The items listed above may not be a complete list of foods and beverages to avoid. Contact your dietitian for more information. °WHAT ARE SOME TIPS FOR INCLUDING HIGH-FIBER FOODS IN MY DIET? °· Eat a wide variety of high-fiber foods. °· Make sure that half of all grains consumed each day are whole grains. °· Replace breads and cereals made from refined flour or white flour with whole-grain breads and cereals. °· Replace white rice with brown rice, bulgur wheat, or millet. °· Start the day with a breakfast that is high in fiber, such as a cereal that contains at least 5 grams of fiber per serving. °· Use beans in place of meat in soups, salads, or pasta. °· Eat high-fiber snacks, such as berries, raw vegetables, nuts, or popcorn. °  °This information is not intended to replace advice given to you by your health care provider. Make sure you discuss any questions you have with your health care provider. °  °Document Released: 01/22/2005 Document Revised: 02/12/2014 Document   Reviewed: 07/07/2013 °Elsevier Interactive Patient Education ©2016 Elsevier Inc. ° °

## 2015-08-08 ENCOUNTER — Ambulatory Visit (INDEPENDENT_AMBULATORY_CARE_PROVIDER_SITE_OTHER): Payer: Medicare Other | Admitting: Obstetrics and Gynecology

## 2015-08-08 ENCOUNTER — Encounter: Payer: Self-pay | Admitting: Obstetrics and Gynecology

## 2015-08-08 VITALS — BP 111/68 | HR 79 | Wt 164.3 lb

## 2015-08-08 DIAGNOSIS — O99350 Diseases of the nervous system complicating pregnancy, unspecified trimester: Secondary | ICD-10-CM

## 2015-08-08 DIAGNOSIS — G40909 Epilepsy, unspecified, not intractable, without status epilepticus: Secondary | ICD-10-CM | POA: Diagnosis not present

## 2015-08-08 DIAGNOSIS — O0991 Supervision of high risk pregnancy, unspecified, first trimester: Secondary | ICD-10-CM

## 2015-08-08 DIAGNOSIS — O99351 Diseases of the nervous system complicating pregnancy, first trimester: Secondary | ICD-10-CM | POA: Diagnosis not present

## 2015-08-08 DIAGNOSIS — O0990 Supervision of high risk pregnancy, unspecified, unspecified trimester: Secondary | ICD-10-CM | POA: Insufficient documentation

## 2015-08-08 DIAGNOSIS — Z113 Encounter for screening for infections with a predominantly sexual mode of transmission: Secondary | ICD-10-CM

## 2015-08-08 DIAGNOSIS — F250 Schizoaffective disorder, bipolar type: Secondary | ICD-10-CM

## 2015-08-08 LAB — POCT URINALYSIS DIP (DEVICE)
Bilirubin Urine: NEGATIVE
Glucose, UA: NEGATIVE mg/dL
HGB URINE DIPSTICK: NEGATIVE
Ketones, ur: NEGATIVE mg/dL
NITRITE: NEGATIVE
PH: 8.5 — AB (ref 5.0–8.0)
PROTEIN: NEGATIVE mg/dL
SPECIFIC GRAVITY, URINE: 1.02 (ref 1.005–1.030)
UROBILINOGEN UA: 0.2 mg/dL (ref 0.0–1.0)

## 2015-08-08 NOTE — Addendum Note (Signed)
Addended by: Michel Harrow on: 08/08/2015 10:56 AM   Modules accepted: Orders

## 2015-08-08 NOTE — Progress Notes (Signed)
First Trimester Screen

## 2015-08-08 NOTE — Progress Notes (Addendum)
First Trimester Screen scheduled for 09/06/15.  Pt notified.
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
## 2015-08-09 LAB — CULTURE, OB URINE: Colony Count: 80000

## 2015-08-10 ENCOUNTER — Encounter: Payer: Self-pay | Admitting: Obstetrics and Gynecology

## 2015-08-10 DIAGNOSIS — O26899 Other specified pregnancy related conditions, unspecified trimester: Secondary | ICD-10-CM

## 2015-08-10 DIAGNOSIS — Z6791 Unspecified blood type, Rh negative: Secondary | ICD-10-CM | POA: Insufficient documentation

## 2015-08-10 LAB — PRENATAL PROFILE (SOLSTAS)
ANTIBODY SCREEN: NEGATIVE
BASOS ABS: 0 {cells}/uL (ref 0–200)
BASOS PCT: 0 %
EOS ABS: 0 {cells}/uL — AB (ref 15–500)
Eosinophils Relative: 0 %
HCT: 33.7 % — ABNORMAL LOW (ref 35.0–45.0)
HIV: NONREACTIVE
Hemoglobin: 8.7 g/dL — ABNORMAL LOW (ref 11.7–15.5)
Hepatitis B Surface Ag: NEGATIVE
LYMPHS PCT: 24 %
Lymphs Abs: 2064 cells/uL (ref 850–3900)
MCH: 22.1 pg — AB (ref 27.0–33.0)
MCHC: 25.8 g/dL — AB (ref 32.0–36.0)
MCV: 85.8 fL (ref 80.0–100.0)
MONO ABS: 430 {cells}/uL (ref 200–950)
MONOS PCT: 5 %
MPV: 10.9 fL (ref 7.5–12.5)
Neutro Abs: 6106 cells/uL (ref 1500–7800)
Neutrophils Relative %: 71 %
Platelets: 161 10*3/uL (ref 140–400)
RBC: 3.93 MIL/uL (ref 3.80–5.10)
RDW: 13.2 % (ref 11.0–15.0)
RH TYPE: NEGATIVE
RUBELLA: 1.91 {index} — AB (ref <0.90)
WBC: 8.6 10*3/uL (ref 3.8–10.8)

## 2015-08-10 LAB — PAIN MGMT, PROFILE 6 CONF W/O MM, U
6 Acetylmorphine: NEGATIVE ng/mL (ref <10)
ALCOHOL METABOLITES: NEGATIVE ng/mL (ref <500)
Amphetamines: NEGATIVE ng/mL (ref <500)
BARBITURATES: NEGATIVE ng/mL (ref <300)
BENZODIAZEPINES: NEGATIVE ng/mL (ref <100)
COCAINE METABOLITE: NEGATIVE ng/mL (ref <150)
CREATININE: 158.7 mg/dL (ref >=20.0)
MARIJUANA METABOLITE: NEGATIVE ng/mL (ref <20)
METHADONE METABOLITE: NEGATIVE ng/mL (ref <100)
OPIATES: NEGATIVE ng/mL (ref <100)
OXYCODONE: NEGATIVE ng/mL (ref <100)
Oxidant: NEGATIVE ug/mL (ref <200)
PHENCYCLIDINE: NEGATIVE ng/mL (ref <25)
PLEASE NOTE: 0
pH: 7.6 (ref 4.5–9.0)

## 2015-08-11 ENCOUNTER — Other Ambulatory Visit: Payer: Self-pay | Admitting: Neurology

## 2015-08-12 ENCOUNTER — Other Ambulatory Visit: Payer: Self-pay | Admitting: Family Medicine

## 2015-08-12 ENCOUNTER — Encounter: Payer: Self-pay | Admitting: *Deleted

## 2015-08-15 ENCOUNTER — Ambulatory Visit: Payer: Medicare Other

## 2015-08-15 ENCOUNTER — Emergency Department (HOSPITAL_COMMUNITY)
Admission: EM | Admit: 2015-08-15 | Discharge: 2015-08-15 | Disposition: A | Discharge status: Home / Self Care | Payer: Medicare Other | Attending: Emergency Medicine | Admitting: Emergency Medicine

## 2015-08-15 ENCOUNTER — Encounter (HOSPITAL_COMMUNITY): Payer: Self-pay | Admitting: *Deleted

## 2015-08-15 DIAGNOSIS — Z3A09 9 weeks gestation of pregnancy: Secondary | ICD-10-CM | POA: Diagnosis not present

## 2015-08-15 DIAGNOSIS — Y929 Unspecified place or not applicable: Secondary | ICD-10-CM | POA: Diagnosis not present

## 2015-08-15 DIAGNOSIS — Z79899 Other long term (current) drug therapy: Secondary | ICD-10-CM | POA: Insufficient documentation

## 2015-08-15 DIAGNOSIS — O9A211 Injury, poisoning and certain other consequences of external causes complicating pregnancy, first trimester: Secondary | ICD-10-CM | POA: Diagnosis not present

## 2015-08-15 DIAGNOSIS — X58XXXA Exposure to other specified factors, initial encounter: Secondary | ICD-10-CM | POA: Diagnosis not present

## 2015-08-15 DIAGNOSIS — Y939 Activity, unspecified: Secondary | ICD-10-CM | POA: Diagnosis not present

## 2015-08-15 DIAGNOSIS — M25562 Pain in left knee: Secondary | ICD-10-CM | POA: Diagnosis not present

## 2015-08-15 DIAGNOSIS — Y999 Unspecified external cause status: Secondary | ICD-10-CM | POA: Diagnosis not present

## 2015-08-15 DIAGNOSIS — Z87891 Personal history of nicotine dependence: Secondary | ICD-10-CM | POA: Diagnosis not present

## 2015-08-15 DIAGNOSIS — J45909 Unspecified asthma, uncomplicated: Secondary | ICD-10-CM | POA: Diagnosis not present

## 2015-08-15 NOTE — ED Notes (Signed)
PT thinks she broke a bone in her left knee.  PT states she did not fall and states a little bit of weight will cause it to break from past injuries.  Pt states [redacted] weeks pregnant tomorrow.  No problems with pregnancy, only her knee.

## 2015-08-15 NOTE — ED Provider Notes (Signed)
History  By signing my name below, I, Bea Graff, attest that this documentation has been prepared under the direction and in the presence of Margarita Mail, PA-C. Electronically Signed: Bea Graff, ED Scribe. 08/15/2015. 8:00 PM.  Chief Complaint  Patient presents with  . Knee Injury   The history is provided by the patient and medical records. No language interpreter was used.    HPI Comments:  [name] is a 25 y.o. female at 9 weeks 6 days gestation who presents to the Emergency Department complaining of left knee pain that began earlier today. She reports an associated bruise on the left knee. She reports fracturing the left patellar in the past and believes there is a bone broken in the left knee again. She has not been able to bear weight. She has taken Tylenol for pain with minimal relief. Trying to stand or bear weight increases the pain. She denies alleviating factors. She denies wounds, numbness, tingling or weakness of the LLE.   Past Medical History  Diagnosis Date  . Asthma   . Depression   . Schizoaffective disorder, bipolar type ([place])   . Autism   . Essential tremor   . Chronic constipation   . Acid reflux   . Drug-seeking behavior   . Seizures ([place])   . Schizo-affective psychosis ([place])   . Personality disorder   . 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 Mouth surgery     Family History  Problem Relation Age of Onset  . Mental illness Father   . Asthma Father   . PDD Brother   . Seizures Brother    Social History  Substance Use Topics  . Smoking status: Former Smoker -- 0.00 packs/day  . Smokeless tobacco: None     Comment: Smoked for 2  years age [age]  . Alcohol Use: 0.6 oz/week    1 Standard drinks or equivalent per week     Comment: "not with preg"   OB History    Gravida Para Term Preterm AB TAB SAB Ectopic Multiple Living   2    1  1         Review of Systems  Musculoskeletal:  Positive for arthralgias.  Skin: Positive for color change. Negative for wound.  Neurological: Negative for weakness and numbness.    Allergies  Bee venom; Coconut flavor; Geodon; Haloperidol and related; Lithium; Oxycodone; Seroquel; Percocet; Prilosec; Shellfish allergy; Tegretol; and Phenergan  Home Medications   Prior to Admission medications   Medication Sig Start Date End Date Taking? Authorizing Provider  albuterol (PROVENTIL HFA;VENTOLIN HFA) 108 (90 Base) MCG/ACT inhaler Inhale 1-2 puffs into the lungs every 6 (six) hours as needed for wheezing or shortness of breath. Reported on 05/05/2015 07/20/15   Wendie Agreste, MD  cholecalciferol (VITAMIN D-400) 400 UNITS TABS tablet Take 800 Units by mouth daily.     Historical Provider, MD  docusate sodium (COLACE) 100 MG capsule Take 100 mg by mouth 2 (two) times daily.    Historical Provider, MD  fluticasone-salmeterol (ADVAIR HFA) 115-21 MCG/ACT inhaler Inhale 2 puffs into the lungs 2 (two) times daily. 08/03/15   Timmothy Euler, MD  levETIRAcetam (KEPPRA) 500 MG tablet Take 1 tablet (500 mg total) by mouth 2 (two) times daily. 07/27/15   Britt Bottom, MD  lurasidone (LATUDA) 40 MG TABS tablet Take 1 tablet (40 mg total) by mouth daily with supper. 07/18/15   Hildred Priest,  MD  Menthol, Topical Analgesic, (BENGAY EX) Apply 1 application topically daily as needed (pain).     Historical Provider, MD  Prenatal Vit-Fe Fumarate-FA (PRENATAL MULTIVITAMIN) TABS tablet Take 1 tablet by mouth at bedtime. Patient taking differently: Take 1 tablet by mouth at bedtime.  07/18/15   Hildred Priest, MD  Pyridoxine HCl (B-6) 50 MG TABS Take 100 mg by mouth 2 (two) times daily.     Historical Provider, MD  senna (SENOKOT) 8.6 MG tablet Take 2 tablets by mouth daily.    Historical Provider, MD   Triage Vitals: BP 111/62 mmHg  Pulse 97  Temp(Src) 98.7 F (37.1 C) (Oral)  Resp 20  Ht 5\' 4"  (1.626 m)  Wt 165 lb (74.844 kg)  BMI  28.31 kg/m2  SpO2 100%  LMP 06/07/2015 Physical Exam  Constitutional: She is oriented to person, place, and time. She appears well-developed and well-nourished.  HENT:  Head: Normocephalic and atraumatic.  Eyes: EOM are normal.  Neck: Normal range of motion.  Cardiovascular: Normal rate.   Pulmonary/Chest: Effort normal.  Musculoskeletal: Normal range of motion. She exhibits tenderness. She exhibits no edema.  Left knee swelling and pain on lateral side of patellar tendon. Full ROM. Bruise over the area.  Neurological: She is alert and oriented to person, place, and time.  Normal strength of LLE.  Skin: Skin is warm and dry.  Psychiatric: She has a normal mood and affect. Her behavior is normal.  Nursing note and vitals reviewed.   ED Course  Procedures (including critical care time) DIAGNOSTIC STUDIES: Oxygen Saturation is 100% on RA, normal by my interpretation.   COORDINATION OF CARE: 7:45 PM- Offered to X-Ray left knee but pt declined due to pregnancy. Explained to pt that it is likely not fractured. Advised to RICE the area and continue taking OTC Tylenol for pain. Pt verbalizes understanding and agrees to plan.   MDM   Final diagnoses:  Knee pain, acute, left    Patient declined X-Ray due to pregnancy. Patient given brace while in ED, conservative therapy recommended and discussed. Patient will be discharged home & is agreeable with above plan. Returns precautions discussed. Pt appears safe for discharge.   I personally performed the services described in this documentation, which was scribed in my presence. The recorded information has been reviewed and is accurate.       Margarita Mail, PA-C 08/16/15 0050  Davonna Belling, MD 08/17/15 0100

## 2015-08-15 NOTE — Discharge Instructions (Signed)
Cryotherapy °Cryotherapy means treatment with cold. Ice or gel packs can be used to reduce both pain and swelling. Ice is the most helpful within the first 24 to 48 hours after an injury or flare-up from overusing a muscle or joint. Sprains, strains, spasms, burning pain, shooting pain, and aches can all be eased with ice. Ice can also be used when recovering from surgery. Ice is effective, has very few side effects, and is safe for most people to use. °PRECAUTIONS  °Ice is not a safe treatment option for people with: °· Raynaud phenomenon. This is a condition affecting small blood vessels in the extremities. Exposure to cold may cause your problems to return. °· Cold hypersensitivity. There are many forms of cold hypersensitivity, including: °· Cold urticaria. Red, itchy hives appear on the skin when the tissues begin to warm after being iced. °· Cold erythema. This is a red, itchy rash caused by exposure to cold. °· Cold hemoglobinuria. Red blood cells break down when the tissues begin to warm after being iced. The hemoglobin that carry oxygen are passed into the urine because they cannot combine with blood proteins fast enough. °· Numbness or altered sensitivity in the area being iced. °If you have any of the following conditions, do not use ice until you have discussed cryotherapy with your caregiver: °· Heart conditions, such as arrhythmia, angina, or chronic heart disease. °· High blood pressure. °· Healing wounds or open skin in the area being iced. °· Current infections. °· Rheumatoid arthritis. °· Poor circulation. °· Diabetes. °Ice slows the blood flow in the region it is applied. This is beneficial when trying to stop inflamed tissues from spreading irritating chemicals to surrounding tissues. However, if you expose your skin to cold temperatures for too long or without the proper protection, you can damage your skin or nerves. Watch for signs of skin damage due to cold. °HOME CARE INSTRUCTIONS °Follow  these tips to use ice and cold packs safely. °· Place a dry or damp towel between the ice and skin. A damp towel will cool the skin more quickly, so you may need to shorten the time that the ice is used. °· For a more rapid response, add gentle compression to the ice. °· Ice for no more than 10 to 20 minutes at a time. The bonier the area you are icing, the less time it will take to get the benefits of ice. °· Check your skin after 5 minutes to make sure there are no signs of a poor response to cold or skin damage. °· Rest 20 minutes or more between uses. °· Once your skin is numb, you can end your treatment. You can test numbness by very lightly touching your skin. The touch should be so light that you do not see the skin dimple from the pressure of your fingertip. When using ice, most people will feel these normal sensations in this order: cold, burning, aching, and numbness. °· Do not use ice on someone who cannot communicate their responses to pain, such as small children or people with dementia. °HOW TO MAKE AN ICE PACK °Ice packs are the most common way to use ice therapy. Other methods include ice massage, ice baths, and cryosprays. Muscle creams that cause a cold, tingly feeling do not offer the same benefits that ice offers and should not be used as a substitute unless recommended by your caregiver. °To make an ice pack, do one of the following: °· Place crushed ice or a   bag of frozen vegetables in a sealable plastic bag. Squeeze out the excess air. Place this bag inside another plastic bag. Slide the bag into a pillowcase or place a damp towel between your skin and the bag.  Mix 3 parts water with 1 part rubbing alcohol. Freeze the mixture in a sealable plastic bag. When you remove the mixture from the freezer, it will be slushy. Squeeze out the excess air. Place this bag inside another plastic bag. Slide the bag into a pillowcase or place a damp towel between your skin and the bag. SEEK MEDICAL CARE  IF:  You develop white spots on your skin. This may give the skin a blotchy (mottled) appearance.  Your skin turns blue or pale.  Your skin becomes waxy or hard.  Your swelling gets worse. MAKE SURE YOU:   Understand these instructions.  Will watch your condition.  Will get help right away if you are not doing well or get worse.   This information is not intended to replace advice given to you by your health care provider. Make sure you discuss any questions you have with your health care provider.   Document Released: 09/18/2010 Document Revised: 02/12/2014 Document Reviewed: 09/18/2010 Elsevier Interactive Patient Education 2016 [employer]  Knee Pain Knee pain is a very common symptom and can have many causes. Knee pain often goes away when you follow your health care provider's instructions for relieving pain and discomfort at home. However, knee pain can develop into a condition that needs treatment. Some conditions may include:  Arthritis caused by wear and tear (osteoarthritis).  Arthritis caused by swelling and irritation (rheumatoid arthritis or gout).  A cyst or growth in your knee.  An infection in your knee joint.  An injury that will not heal.  Damage, swelling, or irritation of the tissues that support your knee (torn ligaments or tendinitis). If your knee pain continues, additional tests may be ordered to diagnose your condition. Tests may include X-rays or other imaging studies of your knee. You may also need to have fluid removed from your knee. Treatment for ongoing knee pain depends on the cause, but treatment may include:  Medicines to relieve pain or swelling.  Steroid injections in your knee.  Physical therapy.  Surgery. HOME CARE INSTRUCTIONS  Take medicines only as directed by your health care provider.  Rest your knee and keep it raised (elevated) while you are resting.  Do not do things that cause or worsen pain.  Avoid high-impact  activities or exercises, such as running, jumping rope, or doing jumping jacks.  Apply ice to the knee area:  Put ice in a plastic bag.  Place a towel between your skin and the bag.  Leave the ice on for 20 minutes, 2-3 times a day.  Ask your health care provider if you should wear an elastic knee support.  Keep a pillow under your knee when you sleep.  Lose weight if you are overweight. Extra weight can put pressure on your knee.  Do not use any tobacco products, including cigarettes, chewing tobacco, or electronic cigarettes. If you need help quitting, ask your health care provider. Smoking may slow the healing of any bone and joint problems that you may have. SEEK MEDICAL CARE IF:  Your knee pain continues, changes, or gets worse.  You have a fever along with knee pain.  Your knee buckles or locks up.  Your knee becomes more swollen. SEEK IMMEDIATE MEDICAL CARE IF:   Your knee  joint feels hot to the touch.  You have chest pain or trouble breathing.   This information is not intended to replace advice given to you by your health care provider. Make sure you discuss any questions you have with your health care provider.   Document Released: 11/19/2006 Document Revised: 02/12/2014 Document Reviewed: 09/07/2013 Elsevier Interactive Patient Education 2016 [place] A contusion is a deep bruise. Contusions are the result of a blunt injury to tissues and muscle fibers under the skin. The injury causes bleeding under the skin. The skin overlying the contusion may turn blue, purple, or yellow. Minor injuries will give you a painless contusion, but more severe contusions may stay painful and swollen for a few weeks.  CAUSES  This condition is usually caused by a blow, trauma, or direct force to an area of the body. SYMPTOMS  Symptoms of this condition include:  Swelling of the injured area.  Pain and tenderness in the injured area.  Discoloration. The area may  have redness and then turn blue, purple, or yellow. DIAGNOSIS  This condition is diagnosed based on a physical exam and medical history. An X-ray, CT scan, or MRI may be needed to determine if there are any associated injuries, such as broken bones (fractures). TREATMENT  Specific treatment for this condition depends on what area of the body was injured. In general, the best treatment for a contusion is resting, icing, applying pressure to (compression), and elevating the injured area. This is often called the RICE strategy. Over-the-counter anti-inflammatory medicines may also be recommended for pain control.  HOME CARE INSTRUCTIONS   Rest the injured area.  If directed, apply ice to the injured area:  Put ice in a plastic bag.  Place a towel between your skin and the bag.  Leave the ice on for 20 minutes, 2-3 times per day.  If directed, apply light compression to the injured area using an elastic bandage. Make sure the bandage is not wrapped too tightly. Remove and reapply the bandage as directed by your health care provider.  If possible, raise (elevate) the injured area above the level of your heart while you are sitting or lying down.  Take over-the-counter and prescription medicines only as told by your health care provider. SEEK MEDICAL CARE IF:  Your symptoms do not improve after several days of treatment.  Your symptoms get worse.  You have difficulty moving the injured area. SEEK IMMEDIATE MEDICAL CARE IF:   You have severe pain.  You have numbness in a hand or foot.  Your hand or foot turns pale or cold.   This information is not intended to replace advice given to you by your health care provider. Make sure you discuss any questions you have with your health care provider.   Document Released: 11/01/2004 Document Revised: 10/13/2014 Document Reviewed: 06/09/2014 Elsevier Interactive Patient Education [employer].

## 2015-08-16 ENCOUNTER — Telehealth: Payer: Self-pay

## 2015-08-16 ENCOUNTER — Other Ambulatory Visit: Payer: Self-pay | Admitting: Obstetrics and Gynecology

## 2015-08-16 NOTE — Telephone Encounter (Signed)
Pt is requesting prenatal vitamin be called into to her pharmacy. I have called them in to [facility].

## 2015-08-16 NOTE — Addendum Note (Signed)
Addended by: Phillip Heal, DEMETRICE A on: 08/16/2015 11:43 AM   Modules accepted: Orders

## 2015-08-17 ENCOUNTER — Ambulatory Visit (INDEPENDENT_AMBULATORY_CARE_PROVIDER_SITE_OTHER): Payer: Medicare Other | Admitting: Urgent Care

## 2015-08-17 VITALS — BP 110/66 | HR 101 | Temp 98.6°F | Resp 18 | Ht 64.0 in

## 2015-08-17 DIAGNOSIS — H9201 Otalgia, right ear: Secondary | ICD-10-CM

## 2015-08-17 DIAGNOSIS — H6123 Impacted cerumen, bilateral: Secondary | ICD-10-CM | POA: Diagnosis not present

## 2015-08-17 DIAGNOSIS — Z3490 Encounter for supervision of normal pregnancy, unspecified, unspecified trimester: Secondary | ICD-10-CM

## 2015-08-17 LAB — POCT CBC
GRANULOCYTE PERCENT: 70.9 % (ref 37–80)
HEMATOCRIT: 34.6 % — AB (ref 37.7–47.9)
HEMOGLOBIN: 12.6 g/dL (ref 12.2–16.2)
Lymph, poc: 2.8 (ref 0.6–3.4)
MCH: 29.7 pg (ref 27–31.2)
MCHC: 36.3 g/dL — AB (ref 31.8–35.4)
MCV: 81.8 fL (ref 80–97)
MID (cbc): 0.3 (ref 0–0.9)
MPV: 8.1 fL (ref 0–99.8)
POC GRANULOCYTE: 7.5 — AB (ref 2–6.9)
POC LYMPH PERCENT: 26.4 %L (ref 10–50)
POC MID %: 2.6 % (ref 0–12)
Platelet Count, POC: 215 10*3/uL (ref 142–424)
RBC: 4.23 M/uL (ref 4.04–5.48)
RDW, POC: 13 %
WBC: 10.6 10*3/uL — AB (ref 4.6–10.2)

## 2015-08-17 NOTE — Patient Instructions (Addendum)
Cerumen Impaction The structures of the external ear canal secrete a waxy substance known as cerumen. Excess cerumen can build up in the ear canal, causing a condition known as cerumen impaction. Cerumen impaction can cause ear pain and disrupt the function of the ear. The rate of cerumen production differs for each individual. In certain individuals, the configuration of the ear canal may decrease his or her ability to naturally remove cerumen. CAUSES Cerumen impaction is caused by excessive cerumen production or buildup. RISK FACTORS  Frequent use of swabs to clean ears.  Having narrow ear canals.  Having eczema.  Being dehydrated. SIGNS AND SYMPTOMS  Diminished hearing.  Ear drainage.  Ear pain.  Ear itch. TREATMENT Treatment may involve:  Over-the-counter or prescription ear drops to soften the cerumen.  Removal of cerumen by a health care provider. This may be done with:  Irrigation with warm water. This is the most common method of removal.  Ear curettes and other instruments.  Surgery. This may be done in severe cases. HOME CARE INSTRUCTIONS  Take medicines only as directed by your health care provider.  Do not insert objects into the ear with the intent of cleaning the ear. PREVENTION  Do not insert objects into the ear, even with the intent of cleaning the ear. Removing cerumen as a part of normal hygiene is not necessary, and the use of swabs in the ear canal is not recommended.  Drink enough water to keep your urine clear or pale yellow.  Control your eczema if you have it. SEEK MEDICAL CARE IF:  You develop ear pain.  You develop bleeding from the ear.  The cerumen does not clear after you use ear drops as directed.   This information is not intended to replace advice given to you by your health care provider. Make sure you discuss any questions you have with your health care provider.   Document Released: 03/01/2004 Document Revised: 02/12/2014  Document Reviewed: 09/08/2014 Elsevier Interactive Patient Education 2016 [place] An earache, also called otalgia, can be caused by many things. Pain from an earache can be sharp, dull, or burning. The pain may be temporary or constant. Earaches can be caused by problems with the ear, such as infection in either the middle ear or the ear canal, injury, impacted ear wax, middle ear pressure, or a foreign body in the ear. Ear pain can also result from problems in other areas. This is called referred pain. For example, pain can come from a sore throat, a tooth infection, or problems with the jaw or the joint between the jaw and the skull (temporomandibular joint, or TMJ). The cause of an earache is not always easy to identify. Watchful waiting may be appropriate for some earaches until a clear cause of the pain can be found. HOME CARE INSTRUCTIONS Watch your condition for any changes. The following actions may help to lessen any discomfort that you are feeling:  Take medicines only as directed by your health care provider. This includes ear drops.  Apply ice to your outer ear to help reduce pain.  Put ice in a plastic bag.  Place a towel between your skin and the bag.  Leave the ice on for 20 minutes, 2-3 times per day.  Do not put anything in your ear other than medicine that is prescribed by your health care provider.  Try resting in an upright position instead of lying down. This may help to reduce pressure in the  middle ear and relieve pain.  Chew gum if it helps to relieve your ear pain.  Control any allergies that you have.  Keep all follow-up visits as directed by your health care provider. This is important. SEEK MEDICAL CARE IF:  Your pain does not improve within 2 days.  You have a fever.  You have new or worsening symptoms. SEEK IMMEDIATE MEDICAL CARE IF:  You have a severe headache.  You have a stiff neck.  You have difficulty swallowing.  You  have redness or swelling behind your ear.  You have drainage from your ear.  You have hearing loss.  You feel dizzy.   This information is not intended to replace advice given to you by your health care provider. Make sure you discuss any questions you have with your health care provider.   Document Released: 09/09/2003 Document Revised: 02/12/2014 Document Reviewed: 08/23/2013 Elsevier Interactive Patient Education 2016 [employer].     IF you received an x-ray today, you will receive an invoice from [facility]. Please contact [facility] at [phone] with questions or concerns regarding your invoice.   IF you received labwork today, you will receive an invoice from [employer]. Please contact [facility] at [phone] with questions or concerns regarding your invoice.   Our billing staff will not be able to assist you with questions regarding bills from these companies.  You will be contacted with the lab results as soon as they are available. The fastest way to get your results is to activate your My Chart account. Instructions are located on the last page of this paperwork. If you have not heard from [name] regarding the results in 2 weeks, please contact this office.

## 2015-08-17 NOTE — Progress Notes (Signed)
    MRN: [id number] DOB: [birth year]  Subjective:   [name] is a 25 y.o. female presenting for chief complaint of Ear Pain  Reports 1 day history of severe right ear pain, now having right sided sore throat and neck discomfort, occasional dry cough. Has decreased hearing right side. Patient is 10 months pregnant and is worried that she may have serious infection. Also admits history of cerumen impaction. Has tried Tylenol extra strength with minimal relief. Denies fever, ear drainage, sinus pain, sinus congestion.  [name] has a current medication list which includes the following prescription(s): albuterol, cholecalciferol, docusate sodium, fluticasone-salmeterol, levetiracetam, menthol (topical analgesic), prenatal, b-6, and senna. Also is allergic to bee venom; coconut flavor; geodon; haloperidol and related; lithium; oxycodone; seroquel; percocet; prilosec; shellfish allergy; tegretol; and phenergan.  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 Also  has past surgical history that includes Mouth surgery.  Objective:   Vitals: BP 110/66 mmHg  Pulse 101  Temp(Src) 98.6 F (37 C) (Oral)  Resp 18  Ht 5\' 4"  (1.626 m)  SpO2 98%  LMP 06/07/2015  Physical Exam  Constitutional: She is oriented to person, place, and time.  HENT:  Bilateral cerumen impaction. Right tragus tenderness. Nasal turbinates pink and moist, nasal passages patent. No sinus tenderness. Oropharynx clear, mucous membranes moist, dentition in good repair.  UPDATE: TM's intact and without erythema bilaterally s/p ear lavage.  Eyes: Right eye exhibits no discharge. Left eye exhibits no discharge.  Neck: Normal range of motion. Neck supple.  Cardiovascular: Normal rate.     Pulmonary/Chest: Effort normal.  Lymphadenopathy:    She has cervical adenopathy (right-sided).  Neurological: She is alert and oriented to person, place, and time.  Skin: Skin is warm and dry.  Psychiatric: Her mood appears anxious. Her speech is rapid and/or pressured.   Assessment and Plan :   1. Cerumen impaction, bilateral 2. Right ear pain 3. Pregnancy - Patient reports ongoing pain after ear lavage. However, I will hold off on using antibiotics given physical exam findings, pregnancy. Patient is to rtc on 08/20/2015 if this problem persists.  Jaynee Eagles, PA-C [facility] [phone] 08/17/2015 8:38 AM

## 2015-08-18 ENCOUNTER — Encounter (HOSPITAL_COMMUNITY): Payer: Self-pay

## 2015-08-18 ENCOUNTER — Emergency Department (HOSPITAL_COMMUNITY)
Admission: EM | Admit: 2015-08-18 | Discharge: 2015-08-18 | Disposition: A | Discharge status: Home / Self Care | Payer: Medicare Other | Attending: Emergency Medicine | Admitting: Emergency Medicine

## 2015-08-18 DIAGNOSIS — J45909 Unspecified asthma, uncomplicated: Secondary | ICD-10-CM | POA: Insufficient documentation

## 2015-08-18 DIAGNOSIS — M7989 Other specified soft tissue disorders: Secondary | ICD-10-CM | POA: Insufficient documentation

## 2015-08-18 DIAGNOSIS — Z3A01 Less than 8 weeks gestation of pregnancy: Secondary | ICD-10-CM | POA: Diagnosis not present

## 2015-08-18 DIAGNOSIS — H9201 Otalgia, right ear: Secondary | ICD-10-CM | POA: Diagnosis not present

## 2015-08-18 DIAGNOSIS — O9989 Other specified diseases and conditions complicating pregnancy, childbirth and the puerperium: Secondary | ICD-10-CM | POA: Insufficient documentation

## 2015-08-18 DIAGNOSIS — Z79899 Other long term (current) drug therapy: Secondary | ICD-10-CM | POA: Diagnosis not present

## 2015-08-18 DIAGNOSIS — O1201 Gestational edema, first trimester: Secondary | ICD-10-CM

## 2015-08-18 DIAGNOSIS — S8002XD Contusion of left knee, subsequent encounter: Secondary | ICD-10-CM

## 2015-08-18 DIAGNOSIS — Z87891 Personal history of nicotine dependence: Secondary | ICD-10-CM | POA: Diagnosis not present

## 2015-08-18 NOTE — ED Provider Notes (Signed)
CSN: [id number]     Arrival date & time 08/18/15  0514 History   First MD Initiated Contact with Patient 08/18/15 0600     Chief Complaint  Patient presents with  . Ear Pain  . Knee Pain  . Leg Swelling     (Consider location/radiation/quality/duration/timing/severity/associated sxs/prior Treatment) HPI Comments: Patients who is in her first trimester pregnancy, history of schizoaffective disorder, history of personality disorder, history of seizures -- presents with multiple complaints. Patient states that her left knee has been hurting for several days since an injury that occurred while she was having sex. She was seen in emergency department on 08/15/15 for the same and was discharged with a knee sleeve and crutches. She states that she has been using these helped her pain continues. She is ambulatory. No worsening swelling of the knee itself. Patient also complains about 2 days of right ear pain and right-sided sore throat. She had ears cleaned at an urgent care. Denies foreign bodies in the ear. No associated fevers, otorrhea, nasal discharge or congestion. No cough, nausea, vomiting, or diarrhea. Patient has been using Tylenol without relief. She also complains of bilateral lower extremity edema. No redness or associated pain. No chest pain or shortness of breath. No history of pulmonary embolism or DVT. No history of heart, liver, or kidney conditions. Onset of symptoms acute. Course is constant. No known sick contacts. No other medications prior to arrival.  The history is provided by the patient.    Past Medical History  Diagnosis Date  . Asthma   . Depression   . Schizoaffective disorder, bipolar type ([place])   . Autism   . Essential tremor   . Chronic constipation   . Acid reflux   . Drug-seeking behavior   . Seizures ([place])   . Schizo-affective psychosis ([place])   . Personality disorder   . 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  Mouth surgery     Family History  Problem Relation Age of Onset  . Mental illness Father   . Asthma Father   . PDD Brother   . Seizures Brother    Social History  Substance Use Topics  . Smoking status: Former Smoker -- 0.00 packs/day  . Smokeless tobacco: None     Comment: Smoked for 2  years age [age]  . Alcohol Use: 0.6 oz/week    1 Standard drinks or equivalent per week     Comment: "not with preg"   OB History    Gravida Para Term Preterm AB TAB SAB Ectopic Multiple Living   2    1  1         Review of Systems  Constitutional: Negative for fever, chills, activity change and fatigue.  HENT: Positive for ear pain and sore throat. Negative for congestion, rhinorrhea and sinus pressure.   Eyes: Negative for redness.  Respiratory: Negative for cough and wheezing.   Cardiovascular: Positive for leg swelling.  Gastrointestinal: Negative for nausea, vomiting, abdominal pain and diarrhea.  Genitourinary: Negative for dysuria.  Musculoskeletal: Positive for arthralgias. Negative for myalgias, back pain, joint swelling, neck pain and neck stiffness.  Skin: Negative for rash and wound.  Neurological: Negative for weakness, numbness and headaches.  Hematological: Negative for adenopathy.      Allergies  Bee venom; Coconut flavor; Geodon; Haloperidol and related; Lithium; Oxycodone; Seroquel; Percocet; Prilosec; Shellfish allergy; Tegretol; and Phenergan  Home Medications   Prior to Admission medications  Medication Sig Start Date End Date Taking? Authorizing Provider  albuterol (PROVENTIL HFA;VENTOLIN HFA) 108 (90 Base) MCG/ACT inhaler Inhale 1-2 puffs into the lungs every 6 (six) hours as needed for wheezing or shortness of breath. Reported on 05/05/2015 07/20/15  Yes Wendie Agreste, MD  cholecalciferol (VITAMIN D-400) 400 UNITS TABS tablet Take 800 Units by mouth daily.    Yes Historical Provider, MD  docusate sodium (COLACE) 100 MG capsule Take 100 mg by mouth 2 (two) times  daily.   Yes Historical Provider, MD  fluticasone-salmeterol (ADVAIR HFA) 115-21 MCG/ACT inhaler Inhale 2 puffs into the lungs 2 (two) times daily. 08/03/15  Yes Timmothy Euler, MD  levETIRAcetam (KEPPRA) 500 MG tablet Take 1 tablet (500 mg total) by mouth 2 (two) times daily. 07/27/15  Yes Britt Bottom, MD  Menthol, Topical Analgesic, (BENGAY EX) Apply 1 application topically daily as needed (pain).    Yes Historical Provider, MD  PRENATAL 27-1 MG TABS TAKE 1 TABLET BY MOUTH AT BEDTIME 08/16/15  Yes Ugonna A Anyanwu, MD  Pyridoxine HCl (B-6) 50 MG TABS Take 100 mg by mouth 2 (two) times daily.    Yes Historical Provider, MD  senna (SENOKOT) 8.6 MG tablet Take 2 tablets by mouth daily.   Yes Historical Provider, MD   BP 116/59 mmHg  Pulse 90  Temp(Src) 98 F (36.7 C) (Oral)  Resp 20  Ht 5\' 5"  (1.651 m)  Wt 74.844 kg  BMI 27.46 kg/m2  SpO2 99%  LMP 06/07/2015   Physical Exam  Constitutional: She appears well-developed and well-nourished.  HENT:  Head: Normocephalic and atraumatic.  Right Ear: Tympanic membrane, external ear and ear canal normal.  Left Ear: Tympanic membrane, external ear and ear canal normal.  Nose: Nose normal. No mucosal edema or rhinorrhea.  Mouth/Throat: Oropharynx is clear and moist and mucous membranes are normal. No oropharyngeal exudate, posterior oropharyngeal edema or posterior oropharyngeal erythema.  Eyes: Conjunctivae are normal. Pupils are equal, round, and reactive to light. Right eye exhibits no discharge. Left eye exhibits no discharge.  Neck: Normal range of motion. Neck supple.  Cardiovascular: Normal rate, regular rhythm and normal heart sounds.   Pulmonary/Chest: Effort normal and breath sounds normal. No respiratory distress. She has no wheezes. She has no rales.  Abdominal: Soft. There is no tenderness.  Musculoskeletal: She exhibits edema and tenderness.       Right hip: Normal.       Left hip: Normal.       Right knee: Normal. She  exhibits normal range of motion.       Left knee: Tenderness found. Medial joint line and lateral joint line tenderness noted.       Right ankle: Normal.       Left ankle: Normal.       Right upper leg: Normal.       Left upper leg: Normal.       Right lower leg: Normal.       Left lower leg: Normal.       Right foot: There is swelling (trace). There is normal range of motion, no tenderness and normal capillary refill.       Left foot: There is swelling (trace). There is normal range of motion, no tenderness and normal capillary refill.  Trace bilateral symmetric pedal edema, non-pitting.   Neurological: She is alert.  Skin: Skin is warm and dry.  Psychiatric: She has a normal mood and affect.  Nursing note and vitals reviewed.  ED Course  Procedures (including critical care time)   6:20 AM Patient seen and examined.    Vital signs reviewed and are as follows: BP 116/59 mmHg  Pulse 90  Temp(Src) 98 F (36.7 C) (Oral)  Resp 20  Ht 5\' 5"  (1.651 m)  Wt 74.844 kg  BMI 27.46 kg/m2  SpO2 99%  LMP 06/07/2015   Knee pain: Good ROM, no point tenderness, no significant mechanism. Suspect bruising. Continue Immobilizer and crutches as needed. Weightbearing as tolerated.  Ear pain: No evidence of infection. Continue Tylenol. Possible oral syndrome with eustachian tube dysfunction.  Bilateral lower extremity edema: Very mild. Likely related to pregnancy. Encouraged elevation, compression stockings as needed. Blood pressure is normal. Do not suspect kidney failure, liver failure, heart failure.  Patient urged to return with worsening symptoms or other concerns. Patient verbalized understanding and agrees with plan.   MDM   Final diagnoses:  Otalgia, right  Leg swelling in pregnancy in first trimester   As above.    Carlisle Cater, PA-C [id number]  Delora Fuel, MD [id number] [id number]

## 2015-08-18 NOTE — Discharge Instructions (Signed)
Please read and follow all provided instructions.  Your diagnoses today include:  1. Otalgia, right   2. Leg swelling in pregnancy in first trimester    Tests performed today include:  Vital signs. See below for your results today.   Medications prescribed:   Tylenol - medication for pain  Take any prescribed medications only as directed.  Home care instructions:  Follow any educational materials contained in this packet.  BE VERY CAREFUL not to take multiple medicines containing Tylenol (also called acetaminophen). Doing so can lead to an overdose which can damage your liver and cause liver failure and possibly death.   Follow-up instructions: Please follow-up with your primary care provider in the next 3 days for further evaluation of your symptoms.   Return instructions:   Please return to the Emergency Department if you experience worsening symptoms.   Return with chest pain or shortness of breath, if your legs become red or you develop a fever.   Please return if you have any other emergent concerns.  Additional Information:  Your vital signs today were: BP 116/59 mmHg   Pulse 90   Temp(Src) 98 F (36.7 C) (Oral)   Resp 20   Ht 5\' 5"  (1.651 m)   Wt 74.844 kg   BMI 27.46 kg/m2   SpO2 99%   LMP 06/07/2015 If your blood pressure (BP) was elevated above 135/85 this visit, please have this repeated by your doctor within one month. --------------

## 2015-08-18 NOTE — ED Notes (Signed)
Pt had wax removed from bilateral ears and is having right ear pain. Also complaining of left knee pain that started several days ago. She was seen here for the knee pain but states no xray was done because she's [redacted] weeks pregnant and the pain is getting worse. Pt states she has bilateral leg swelling

## 2015-08-24 ENCOUNTER — Encounter: Payer: Self-pay | Admitting: Family Medicine

## 2015-08-25 ENCOUNTER — Encounter (HOSPITAL_COMMUNITY): Payer: Self-pay | Admitting: *Deleted

## 2015-08-25 ENCOUNTER — Emergency Department (HOSPITAL_COMMUNITY)
Admission: EM | Admit: 2015-08-25 | Discharge: 2015-08-25 | Disposition: A | Discharge status: Home / Self Care | Payer: Medicare Other | Attending: Dermatology | Admitting: Dermatology

## 2015-08-25 DIAGNOSIS — Z5321 Procedure and treatment not carried out due to patient leaving prior to being seen by health care provider: Secondary | ICD-10-CM | POA: Diagnosis not present

## 2015-08-25 DIAGNOSIS — F600 Paranoid personality disorder: Secondary | ICD-10-CM | POA: Insufficient documentation

## 2015-08-25 NOTE — ED Notes (Signed)
Arletha Grippe the pt's [facility] ACT team member is in the hospital to see another pt and ran into pt; Magda Paganini states that she advised pt that she did not need to come to the hospital tonight and that she has a place to live and resources; Magda Paganini advised that the pt's husband is seeking treatment for hallucinations at another facility and will be home; Magda Paganini reports that pt is at her baseline since coming off her medications with the pregnancy; Magda Paganini states that she will come and take the pt home once she is discharged but if pt refuses she is on her own because she has been provided adequate resources; she states that the patient has talked to her numerous times tonight and that she knows how to contact her

## 2015-08-25 NOTE — ED Notes (Signed)
Pt arrived to the ER via [employer]; pt states that she does not want to be alone; pt states that she gets paranoid alone; pt states that she is 4 months pregnant and that her husband left her tonight; [employer] provided pt with resources and shelters and pt refused; pt denies SI / HI; pt states "If you don't let me stay here tonight I will say I am suicidal so you will have to keep me"; pt tearful and crying and states "I have never been alone and my family won't come get me or come stay with me"; pt has an apartment and refuses to stay in her apartment by herself tonight; when pt advised that she can wait in the lobby to be seen she began screaming, yelling and cursing and stating "You don't care about me, I need a place to stay" RN advised that pt is able to wait in the lobby until she is to be seen but the cursing and yelling will not be tolerated

## 2015-08-25 NOTE — ED Notes (Signed)
PT decided to leave with [name] her ACT team member prior to being seen; pt was seen leaving the ER and getting in the car with [name]

## 2015-08-26 ENCOUNTER — Encounter (HOSPITAL_COMMUNITY): Payer: Self-pay | Admitting: *Deleted

## 2015-08-26 ENCOUNTER — Emergency Department (HOSPITAL_COMMUNITY)
Admission: EM | Admit: 2015-08-26 | Discharge: 2015-08-29 | Disposition: A | Discharge status: Home / Self Care | Payer: Medicare Other | Attending: Emergency Medicine | Admitting: Emergency Medicine

## 2015-08-26 DIAGNOSIS — R45851 Suicidal ideations: Secondary | ICD-10-CM | POA: Diagnosis not present

## 2015-08-26 DIAGNOSIS — O219 Vomiting of pregnancy, unspecified: Secondary | ICD-10-CM | POA: Diagnosis present

## 2015-08-26 DIAGNOSIS — O26891 Other specified pregnancy related conditions, first trimester: Secondary | ICD-10-CM | POA: Diagnosis not present

## 2015-08-26 DIAGNOSIS — Z3A08 8 weeks gestation of pregnancy: Secondary | ICD-10-CM | POA: Diagnosis not present

## 2015-08-26 DIAGNOSIS — J45909 Unspecified asthma, uncomplicated: Secondary | ICD-10-CM | POA: Insufficient documentation

## 2015-08-26 DIAGNOSIS — Z87891 Personal history of nicotine dependence: Secondary | ICD-10-CM | POA: Diagnosis not present

## 2015-08-26 DIAGNOSIS — Z791 Long term (current) use of non-steroidal anti-inflammatories (NSAID): Secondary | ICD-10-CM | POA: Insufficient documentation

## 2015-08-26 DIAGNOSIS — F250 Schizoaffective disorder, bipolar type: Secondary | ICD-10-CM

## 2015-08-26 DIAGNOSIS — F329 Major depressive disorder, single episode, unspecified: Secondary | ICD-10-CM | POA: Insufficient documentation

## 2015-08-26 DIAGNOSIS — Z79899 Other long term (current) drug therapy: Secondary | ICD-10-CM | POA: Diagnosis not present

## 2015-08-26 DIAGNOSIS — F840 Autistic disorder: Secondary | ICD-10-CM | POA: Diagnosis not present

## 2015-08-26 LAB — CBC
HEMATOCRIT: 38.3 % (ref 36.0–46.0)
HEMOGLOBIN: 12.8 g/dL (ref 12.0–15.0)
MCH: 28.4 pg (ref 26.0–34.0)
MCHC: 33.4 g/dL (ref 30.0–36.0)
MCV: 84.9 fL (ref 78.0–100.0)
Platelets: 256 10*3/uL (ref 150–400)
RBC: 4.51 MIL/uL (ref 3.87–5.11)
RDW: 13.2 % (ref 11.5–15.5)
WBC: 16.3 10*3/uL — ABNORMAL HIGH (ref 4.0–10.5)

## 2015-08-26 LAB — COMPREHENSIVE METABOLIC PANEL
ALK PHOS: 42 U/L (ref 38–126)
ALT: 18 U/L (ref 14–54)
ANION GAP: 10 (ref 5–15)
AST: 28 U/L (ref 15–41)
Albumin: 4.4 g/dL (ref 3.5–5.0)
BILIRUBIN TOTAL: 0.6 mg/dL (ref 0.3–1.2)
BUN: 8 mg/dL (ref 6–20)
CALCIUM: 9.6 mg/dL (ref 8.9–10.3)
CO2: 17 mmol/L — ABNORMAL LOW (ref 22–32)
Chloride: 110 mmol/L (ref 101–111)
Creatinine, Ser: 0.48 mg/dL (ref 0.44–1.00)
Glucose, Bld: 97 mg/dL (ref 65–99)
POTASSIUM: 3.7 mmol/L (ref 3.5–5.1)
Sodium: 137 mmol/L (ref 135–145)
TOTAL PROTEIN: 7.9 g/dL (ref 6.5–8.1)

## 2015-08-26 LAB — I-STAT BETA HCG BLOOD, ED (MC, WL, AP ONLY)

## 2015-08-26 LAB — ETHANOL: Alcohol, Ethyl (B): <5 mg/dL (ref <5)

## 2015-08-26 LAB — RAPID URINE DRUG SCREEN, HOSP PERFORMED
Amphetamines: NOT DETECTED
BARBITURATES: NOT DETECTED
BENZODIAZEPINES: NOT DETECTED
COCAINE: NOT DETECTED
Opiates: NOT DETECTED
TETRAHYDROCANNABINOL: NOT DETECTED

## 2015-08-26 LAB — ACETAMINOPHEN LEVEL: ACETAMINOPHEN (TYLENOL), SERUM: 15 ug/mL (ref 10–30)

## 2015-08-26 LAB — SALICYLATE LEVEL: Salicylate Lvl: <4 mg/dL (ref 2.8–30.0)

## 2015-08-26 MED ORDER — OLANZAPINE 10 MG PO TBDP
10.0000 mg | ORAL_TABLET | Freq: Every day | ORAL | Status: DC
  Administered 2015-08-26 – 2015-08-28 (×3): 10 mg via ORAL
  Filled 2015-08-26 (×3): qty 1

## 2015-08-26 MED ORDER — OLANZAPINE 10 MG PO TBDP
10.0000 mg | ORAL_TABLET | Freq: Three times a day (TID) | ORAL | Status: DC | PRN
  Administered 2015-08-26 – 2015-08-29 (×5): 10 mg via ORAL
  Filled 2015-08-26 (×6): qty 1

## 2015-08-26 MED ORDER — GABAPENTIN 100 MG PO CAPS
200.0000 mg | ORAL_CAPSULE | Freq: Three times a day (TID) | ORAL | Status: DC
  Administered 2015-08-26 – 2015-08-29 (×9): 200 mg via ORAL
  Filled 2015-08-26 (×9): qty 2

## 2015-08-26 MED ORDER — SENNA 8.6 MG PO TABS
2.0000 | ORAL_TABLET | Freq: Every day | ORAL | Status: DC
  Administered 2015-08-26 – 2015-08-28 (×3): 17.2 mg via ORAL
  Filled 2015-08-26 (×3): qty 2

## 2015-08-26 MED ORDER — DIPHENHYDRAMINE HCL 25 MG PO CAPS
50.0000 mg | ORAL_CAPSULE | Freq: Once | ORAL | Status: AC
  Administered 2015-08-26: 50 mg via ORAL
  Filled 2015-08-26: qty 2

## 2015-08-26 MED ORDER — PRENATAL MULTIVITAMIN CH
1.0000 | ORAL_TABLET | Freq: Every day | ORAL | Status: DC
  Administered 2015-08-26 – 2015-08-28 (×3): 1 via ORAL
  Filled 2015-08-26 (×3): qty 1

## 2015-08-26 MED ORDER — DOCUSATE SODIUM 100 MG PO CAPS
200.0000 mg | ORAL_CAPSULE | Freq: Every day | ORAL | Status: DC
  Administered 2015-08-27 – 2015-08-29 (×3): 200 mg via ORAL
  Filled 2015-08-26 (×3): qty 2

## 2015-08-26 MED ORDER — CHOLECALCIFEROL 10 MCG (400 UNIT) PO TABS
800.0000 [IU] | ORAL_TABLET | Freq: Every day | ORAL | Status: DC
  Administered 2015-08-27 – 2015-08-29 (×3): 800 [IU] via ORAL
  Filled 2015-08-26 (×4): qty 2

## 2015-08-26 NOTE — BH Assessment (Signed)
Clinician was informed by Jeannene Patella, RN that pt is agitated at this time and unable to participate in assessment. Clinician requested consult be canceled and resubmitted once pt able to participate.

## 2015-08-26 NOTE — ED Notes (Signed)
Patient noted in room. No complaints, stable, in no acute distress. Q15 minute rounds and monitoring via Security Cameras to continue.  

## 2015-08-26 NOTE — ED Notes (Signed)
Pt. Continues to scream and cry pounding here bed. When told that she was upsetting here next door neighbor she said " I don't care".

## 2015-08-26 NOTE — ED Notes (Signed)
Pt. Back in room crying and loudly talking cursing and yanking on side rails to make noise.

## 2015-08-26 NOTE — BH Assessment (Signed)
[facility] Assessment Progress Note  Case was staffed with Akintayo MD who recommended inpatient admission per IVC as appropriate bed placement is investigated.

## 2015-08-26 NOTE — ED Provider Notes (Signed)
CSN: [id number]     Arrival date & time 08/26/15  0108 History   First MD Initiated Contact with Patient 08/26/15 0154     Chief Complaint  Patient presents with  . Suicidal     (Consider location/radiation/quality/duration/timing/severity/associated sxs/prior Treatment) HPI Comments: Patient presents to the emergency department under IVC for suicidal ideation. She reportedly stated that she wanted to "blow her head off." Patient denies saying this now. She states that her husband just left her. She is reportedly 2 months pregnant. She states that she is very stressed about life, and did write for suicide note today. She states that she plan on overdosing on medication, but ultimately called for help. She states that she did have some nausea and vomiting earlier today. She denies any pain or symptoms now.  The history is provided by the patient. No language interpreter was used.    Past Medical History  Diagnosis Date  . Asthma   . Depression   . Schizoaffective disorder, bipolar type ([place])   . Autism   . Essential tremor   . Chronic constipation   . Acid reflux   . Drug-seeking behavior   . Seizures ([place])   . Schizo-affective psychosis ([place])   . Personality disorder   . 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 Mouth surgery     Family History  Problem Relation Age of Onset  . Mental illness Father   . Asthma Father   . PDD Brother   . Seizures Brother    Social History  Substance Use Topics  . Smoking status: Former Smoker -- 0.00 packs/day  . Smokeless tobacco: None     Comment: Smoked for 2  years age [age]  . Alcohol Use: 0.6 oz/week    1 Standard drinks or equivalent per week     Comment: "not with preg"   OB History    Gravida Para Term Preterm AB TAB SAB Ectopic Multiple Living   2    1  1         Review of Systems  Gastrointestinal: Positive for nausea and vomiting.  Psychiatric/Behavioral: Positive for  suicidal ideas.  All other systems reviewed and are negative.     Allergies  Bee venom; Coconut flavor; Geodon; Haloperidol and related; Lithium; Oxycodone; Seroquel; Percocet; Prilosec; Shellfish allergy; Tegretol; and Phenergan  Home Medications   Prior to Admission medications   Medication Sig Start Date End Date Taking? Authorizing Provider  acetaminophen (TYLENOL) 500 MG tablet Take 500 mg by mouth every 6 (six) hours as needed for mild pain.   Yes Historical Provider, MD  albuterol (PROVENTIL HFA;VENTOLIN HFA) 108 (90 Base) MCG/ACT inhaler Inhale 1-2 puffs into the lungs every 6 (six) hours as needed for wheezing or shortness of breath. Reported on 05/05/2015 07/20/15  Yes Wendie Agreste, MD  calcium carbonate (TUMS - DOSED IN MG ELEMENTAL CALCIUM) 500 MG chewable tablet Chew 1 tablet by mouth daily.   Yes Historical Provider, MD  cholecalciferol (VITAMIN D-400) 400 UNITS TABS tablet Take 800 Units by mouth daily.    Yes Historical Provider, MD  docusate sodium (COLACE) 100 MG capsule Take 200 mg by mouth daily.    Yes Historical Provider, MD  doxylamine, Sleep, (UNISOM) 25 MG tablet Take 25 mg by mouth at bedtime as needed for sleep.   Yes Historical Provider, MD  fluticasone-salmeterol (ADVAIR HFA) 115-21 MCG/ACT inhaler Inhale 2 puffs into the lungs 2 (two) times  daily. 08/03/15  Yes Timmothy Euler, MD  levETIRAcetam (KEPPRA) 500 MG tablet Take 1 tablet (500 mg total) by mouth 2 (two) times daily. 07/27/15  Yes Britt Bottom, MD  Menthol, Topical Analgesic, (BENGAY EX) Apply 1 application topically daily as needed (pain).    Yes Historical Provider, MD  PRENATAL 27-1 MG TABS TAKE 1 TABLET BY MOUTH AT BEDTIME 08/16/15  Yes Ugonna A Anyanwu, MD  Pyridoxine HCl (B-6) 50 MG TABS Take 100 mg by mouth 2 (two) times daily.    Yes Historical Provider, MD  senna (SENOKOT) 8.6 MG tablet Take 2 tablets by mouth daily.   Yes Historical Provider, MD   BP 104/81 mmHg  Pulse 105  Temp(Src)  98.2 F (36.8 C) (Oral)  Resp 18  SpO2 99%  LMP 06/07/2015 Physical Exam  Constitutional: She is oriented to person, place, and time. She appears well-developed and well-nourished.  HENT:  Head: Normocephalic and atraumatic.  Eyes: Conjunctivae and EOM are normal. Pupils are equal, round, and reactive to light.  Neck: Normal range of motion. Neck supple.  Cardiovascular: Normal rate and regular rhythm.  Exam reveals no gallop and no friction rub.   No murmur heard. Pulmonary/Chest: Effort normal and breath sounds normal. No respiratory distress. She has no wheezes. She has no rales. She exhibits no tenderness.  CTAB  Abdominal: Soft. Bowel sounds are normal. She exhibits no distension and no mass. There is no tenderness. There is no rebound and no guarding.  No focal abdominal tenderness, no RLQ tenderness or pain at McBurney's point, no RUQ tenderness or Murphy's sign, no left-sided abdominal tenderness, no fluid wave, or signs of peritonitis   Musculoskeletal: Normal range of motion. She exhibits no edema or tenderness.  Neurological: She is alert and oriented to person, place, and time.  Skin: Skin is warm and dry.  Psychiatric:  Manic Tangential  Nursing note and vitals reviewed.   ED Course  Procedures (including critical care time) Results for orders placed or performed during the hospital encounter of 08/26/15  Comprehensive metabolic panel  Result Value Ref Range   Sodium 137 135 - 145 mmol/L   Potassium 3.7 3.5 - 5.1 mmol/L   Chloride 110 101 - 111 mmol/L   CO2 17 (L) 22 - 32 mmol/L   Glucose, Bld 97 65 - 99 mg/dL   BUN 8 6 - 20 mg/dL   Creatinine, Ser 0.48 0.44 - 1.00 mg/dL   Calcium 9.6 8.9 - 10.3 mg/dL   Total Protein 7.9 6.5 - 8.1 g/dL   Albumin 4.4 3.5 - 5.0 g/dL   AST 28 15 - 41 U/L   ALT 18 14 - 54 U/L   Alkaline Phosphatase 42 38 - 126 U/L   Total Bilirubin 0.6 0.3 - 1.2 mg/dL   GFR calc non Af Amer >60 >60 mL/min   GFR calc Af Amer >60 >60 mL/min    Anion gap 10 5 - 15  Ethanol  Result Value Ref Range   Alcohol, Ethyl (B) <5 <5 mg/dL  Salicylate level  Result Value Ref Range   Salicylate Lvl 123456 2.8 - 30.0 mg/dL  Acetaminophen level  Result Value Ref Range   Acetaminophen (Tylenol), Serum 15 10 - 30 ug/mL  cbc  Result Value Ref Range   WBC 16.3 (H) 4.0 - 10.5 K/uL   RBC 4.51 3.87 - 5.11 MIL/uL   Hemoglobin 12.8 12.0 - 15.0 g/dL   HCT 38.3 36.0 - 46.0 %   MCV 84.9  78.0 - 100.0 fL   MCH 28.4 26.0 - 34.0 pg   MCHC 33.4 30.0 - 36.0 g/dL   RDW 13.2 11.5 - 15.5 %   Platelets 256 150 - 400 K/uL  I-Stat beta hCG blood, ED  Result Value Ref Range   I-stat hCG, quantitative >2000.0 (H) <5 mIU/mL   Comment 3           No results found.    MDM   Final diagnoses:  Suicidal thoughts    Patient here under IVC for suicidal ideation.  Vital signs are stable. Nonspecific leukocytosis, perhaps from vomiting earlier. No abdominal pain. Patient is well-appearing. She is not in any physical distress.  TTS consult pending.    Montine Circle, PA-C 08/26/15 [place], DO 08/26/15 [phone]

## 2015-08-26 NOTE — ED Notes (Signed)
Pt talks to herself in her room alone, and also while sitting in the hallway, ruminating and repeating her concern that she be released so that she can go and make things right with her family.

## 2015-08-26 NOTE — Progress Notes (Signed)
Unable to assess pt. Due to patients screaming and crying when writer attempted to speak to her.  Pt. Refused to stop screaming even though it was the middle of the night and her peers were all resting quietly.  Pt. Had been in the ED earlier in the evening refusing to leave because her husband is being seen at [facility] due to his mental illness and she did not want to be home alone.  Pt.'s was screaming that no one cares about her etc.  Writer just left pt. Alone in the room to calm down and pt. Appears to be settling down for sleep.  Pt. has frequented the ED and [facility] over the years and always requires a lot of attention and redirecting due to explosive loud behavior.  Pt. Is pregnant and off her meds at present time.  Pt. Remains safe on the unitl

## 2015-08-26 NOTE — ED Notes (Signed)
Spoke with [name] the [facility] ACT team member that pt called the Suicide Hotline and is back in the ER; Magda Paganini B states that she will follow up in the am

## 2015-08-26 NOTE — ED Notes (Signed)
Pt's affect is blunted and she is manic. Her speech is rapid and circumstantial. She wants to go see her mother-in-law, give her a hug, and assure her that she wants to take care of her son. She wants to have her baby, keep her baby, and be her own guardian. According to pt she has a court date soon to determine if she can be her own guardian. Her baby's father is now in the hospital for having hallucinations.

## 2015-08-26 NOTE — BH Assessment (Signed)
[facility] Assessment Progress Note  Per [name], MD, this pt requires psychiatric hospitalization at this time.  The following facilities have been contacted to seek placement for this pt, with results as noted:  Beds available, information sent, decision pending:  [facility]   At capacity:  [facility]   [name], [place] Triage Specialist [phone]

## 2015-08-26 NOTE — ED Notes (Signed)
Pt. Up to restroom. 

## 2015-08-26 NOTE — ED Notes (Signed)
Pt. Out in hall to restroom, crying and mumbling. Attempted to find out why patient upset. Between sobs she mumbles that she doesn't want to be here and needs to call her husband who is a patient at [facility]. Patient attempted called her mother. Determined that her husband is not "in the system" and related that to her. Pt. More upset now yelling and crying. Redirection not effective.

## 2015-08-26 NOTE — ED Notes (Signed)
Report from RN. Patient sleeping, respirations regular and unlabored. Q15 minute rounds and security camera observation to continue.

## 2015-08-26 NOTE — BH Assessment (Addendum)
Assessment Note  [name] is an 25 y.o. female that presents this date under IVC initiated by [employer] after patient contacted EMS stating she had thoughts of self harm with a plan to overdose. Patient is currently 4 months pregnant and reported she D/Ced her medications two weeks ago Anette Guarneri) due to side effects. Patient presented with a very agitated affect and pressured speech as she interacted with this Probation officer. Patient has a guardian [employer] of , who manages her care. Patient is also followed by a [facility] ACT team who assist with medication management and therapy. Patient has had multiple hospitalizations with the last one in June of 2017 at [facility] for S/I and symptoms associated with Schizoaffective D/O. Patient is time/placed oriented but is very disorganized, agitated, hyper-verbal and difficult to redirect.   Patient reports ongoing relationship problems with her husband who is also currently hospitalized for MH issues. Patient denies any current SA use, HI but seems to be responding to internal stimuli. Admission note from [facility] stated: "Pt's affect is blunted and she is manic. Her speech is rapid and circumstantial. She wants to go see her mother-in-law, give her a hug, and assure her that she wants to take care of her son. She wants to have her baby, keep her baby, and be her own guardian. According to pt she has a court date soon to determine if she can be her own guardian. Her baby's father is now in the hospital for having hallucinations." Admission note stated: "Patient presents to the emergency department under IVC for suicidal ideation. She reportedly stated that she wanted to "blow her head off." Patient denies saying this now. She states that her husband just left her. She is reportedly 2 months pregnant. She states that she is very stressed about life, and did write for suicide note today. She states that she plan on overdosing on medication, but ultimately called for help. She states that  she did have some nausea and vomiting earlier today. She denies any pain or symptoms now." Case was staffed with Akintayo MD who recommended inpatient admission per IVC as appropriate bed placement is investigated.   Diagnosis: Schizoaffective disorder, unspecified type [facility])  Past Medical History:  Past Medical History  Diagnosis Date  . Asthma   . Depression   . Schizoaffective disorder, bipolar type ([place])   . Autism   . Essential tremor   . Chronic constipation   . Acid reflux   . Drug-seeking behavior   . Seizures ([place])   . Schizo-affective psychosis ([place])   . Personality disorder   . 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     Past Surgical History  Procedure Laterality Date  . Mouth surgery      Family History:  Family History  Problem Relation Age of Onset  . Mental illness Father   . Asthma Father   . PDD Brother   . Seizures Brother     Social History:  reports that she has quit smoking. She does not have any smokeless tobacco history on file. She reports that she drinks about 0.6 oz of alcohol per week. She reports that she does not use illicit drugs.  Additional Social History:     CIWA: CIWA-Ar BP: 106/67 mmHg Pulse Rate: 86 COWS:    Allergies:  Allergies  Allergen Reactions  . Bee Venom Anaphylaxis  . Coconut Flavor Anaphylaxis  . Geodon [Ziprasidone Hcl] Other (See Comments)    Paralysis of mouth  . Haloperidol And Related Other (  See Comments)    Paralysis of mouth   . Lithium Other (See Comments)    Seizure like muscle spasms   . Oxycodone Other (See Comments)    hallucinations   . Seroquel [Quetiapine Fumarate] Other (See Comments)    TOO STRONG  . Percocet [Oxycodone-Acetaminophen] Other (See Comments)    Causes hallucinations.  . Prilosec [Omeprazole] Nausea And Vomiting  . Shellfish Allergy Swelling  . Tegretol [Carbamazepine] Nausea And Vomiting  . Phenergan [Promethazine Hcl] Other (See Comments)    Chest pain      Home Medications:   (Not in a hospital admission)  OB/GYN Status:  Patient's last menstrual period was 06/07/2015.  General Assessment Data [facility] of Assessment: [facility] TTS Assessment: In system Is this a Tele or Face-to-Face Assessment?: Face-to-Face Is this an Initial Assessment or a Re-assessment for this encounter?: Initial Assessment Marital status: Married [place] name: na Is patient pregnant?: Yes Pregnancy Status: Yes (Comment: include estimated delivery date) (3/17) Living Arrangements: Spouse/significant other Can pt return to current living arrangement?: Yes Admission Status: Involuntary Is patient capable of signing voluntary admission?: No Referral Source: Other Insurance type: Medicaid  Medical Screening Exam ([place]) Medical Exam completed: Yes  Crisis Care Plan Living Arrangements: Spouse/significant other Legal Guardian: Other: (ARC) Name of Psychiatrist: [facility] (pt cannot remember MD's name) Name of Therapist: pt cannot remember  Education Status Is patient currently in school?: No Current Grade: na Highest grade of school patient has completed: 12 Name of school: na Contact person: na  Risk to self with the past 6 months Suicidal Ideation: Yes-Currently Present Has patient been a risk to self within the past 6 months prior to admission? : Yes Suicidal Intent: Yes-Currently Present Has patient had any suicidal intent within the past 6 months prior to admission? : Yes Is patient at risk for suicide?: Yes Suicidal Plan?: Yes-Currently Present Has patient had any suicidal plan within the past 6 months prior to admission? : Yes Specify Current Suicidal Plan: Overdose Access to Means: Yes Specify Access to Suicidal Means: Per IVC, pt has pills at her home What has been your use of drugs/alcohol within the last 12 months?: Denies Previous Attempts/Gestures: Yes How many times?: 3 Other Self Harm Risks: None Triggers for Past Attempts: Other (Comment)  (relationship issues) Intentional Self Injurious Behavior: None Family Suicide History: No Recent stressful life event(s): Other (Comment) (marital issues) Persecutory voices/beliefs?: No Depression: Yes Depression Symptoms: Feeling worthless/self pity, Feeling angry/irritable Substance abuse history and/or treatment for substance abuse?: No Suicide prevention information given to non-admitted patients: Not applicable  Risk to Others within the past 6 months Homicidal Ideation: No Does patient have any lifetime risk of violence toward others beyond the six months prior to admission? : Unknown Thoughts of Harm to Others: No Current Homicidal Intent: No Current Homicidal Plan: No Access to Homicidal Means: No Identified Victim: NA History of harm to others?: No Assessment of Violence: None Noted Violent Behavior Description: NA Does patient have access to weapons?: No Criminal Charges Pending?: No Does patient have a court date: No Is patient on probation?: No  Psychosis Hallucinations: None noted Delusions: None noted  Mental Status Report Appearance/Hygiene: In scrubs Eye Contact: Fair Motor Activity: Hyperactivity Speech: Pressured Level of Consciousness: Combative Mood: Anxious Affect: Anxious Anxiety Level: Moderate Thought Processes: Irrelevant Judgement: Impaired Orientation: Person, Place Obsessive Compulsive Thoughts/Behaviors: None  Cognitive Functioning Concentration: Decreased Memory: Recent Intact IQ: Average Insight: Poor Impulse Control: Poor Appetite: Fair Weight Loss: 0 Weight  Gain: 0 Sleep: Decreased Total Hours of Sleep: 3 Vegetative Symptoms: None  ADLScreening [facility] Assessment Services) Patient's cognitive ability adequate to safely complete daily activities?: Yes Patient able to express need for assistance with ADLs?: Yes Independently performs ADLs?: Yes (appropriate for developmental age)  Prior Inpatient Therapy Prior Inpatient  Therapy: Yes Prior Therapy Dates: 2016 Prior Therapy Facilty/Provider(s): [facility] Reason for Treatment: MH issues  Prior Outpatient Therapy Prior Outpatient Therapy: Yes Prior Therapy Dates: 2016 Prior Therapy Facilty/Provider(s): [facility] Reason for Treatment: SPMI Does patient have an ACCT team?: Yes Does patient have Intensive In-House Services?  : Yes Does patient have [facility] services? : Yes Does patient have P4CC services?: No  ADL Screening (condition at time of admission) Patient's cognitive ability adequate to safely complete daily activities?: Yes Patient able to express need for assistance with ADLs?: Yes Independently performs ADLs?: Yes (appropriate for developmental age)             Regulatory affairs officer ([place]) Does patient have an advance directive?: No Would patient like information on creating an advanced directive?: No - patient declined information    Additional Information 1:1 In Past 12 Months?: Yes CIRT Risk: No Elopement Risk: No Does patient have medical clearance?: Yes     Disposition: Case was staffed with Akintayo MD who recommended inpatient admission per IVC as appropriate bed placement is investigated. Disposition Initial Assessment Completed for this Encounter: Yes Disposition of Patient: Inpatient treatment program  On Site Evaluation by:   Reviewed with Physician:    Mamie Nick 08/26/2015 1:54 PM

## 2015-08-26 NOTE — ED Notes (Signed)
Pt has been yelling and raving and slamming her tray table. Other patients have complained about the noise, so her door was closed. This made her angry as well. She wants to go home and try to save her marriage. PRN medication was given to pt that was ordered for agitation.

## 2015-08-26 NOTE — ED Notes (Addendum)
Pt arrives to the ER via [employer]: pt called [facility] and advised that she was going to blow her head off since she was home by herself; pt does not have a gun in her house; pt was seen earlier this evening and left prior to being seen; pt advised earlier "I was say I am suicidal so I can stay"; pt was seen and called her [facility] ACT team worker Magda Paganini B who advised her that she needed to stay at home and not come to the hospital; pt was driven home from the ER earlier by her Act team member; pt called the Suicide Hotline after getting home instead of her ACT worker so that she could come back to the hospital; pt tearful and states "I am too scared to stay by myself without my husband"; pt states "I don't want to commit suicide but I was thinking about it since I was left with no one"; pt's husband went to [facility] ER for evaluation tonight and has not returned home; pt states "He didn't tell me or his baby bye and I am left and no one cares about me"; pt is 2 months pregnant; pt has an apartment and a place to stay and has been evaluated and in contact with her ACT worker multiple times this evening

## 2015-08-27 DIAGNOSIS — O26891 Other specified pregnancy related conditions, first trimester: Secondary | ICD-10-CM | POA: Diagnosis not present

## 2015-08-27 MED ORDER — DIPHENHYDRAMINE HCL 25 MG PO CAPS
50.0000 mg | ORAL_CAPSULE | Freq: Every evening | ORAL | Status: DC | PRN
Start: 2015-08-27 — End: 2015-08-29
  Administered 2015-08-27: 50 mg via ORAL
  Filled 2015-08-27 (×2): qty 2

## 2015-08-27 MED ORDER — DIPHENHYDRAMINE HCL 25 MG PO CAPS
50.0000 mg | ORAL_CAPSULE | Freq: Once | ORAL | Status: AC
  Administered 2015-08-27: 50 mg via ORAL

## 2015-08-27 MED ORDER — DIPHENHYDRAMINE HCL 25 MG PO CAPS
ORAL_CAPSULE | ORAL | Status: AC
  Filled 2015-08-27: qty 2

## 2015-08-27 NOTE — ED Notes (Signed)
Patient noted sleeping in room. No complaints, stable, in no acute distress. Q15 minute rounds and monitoring via [employer] to continue.  

## 2015-08-27 NOTE — ED Notes (Signed)
Patient noted in room with sitter at bedside. No complaints, stable, in no acute distress. Q15 minute rounds and monitoring via [employer] to continue.

## 2015-08-27 NOTE — ED Notes (Signed)
Report received from [employer]. Patient alert and oriented, warm and dry, in no acute distress. Patient denies SI, HI, AVH and pain. One on one sitter at bedside. Patient made aware of Q15 minute rounds and security cameras for their safety. Patient instructed to come to me with needs or concerns.

## 2015-08-27 NOTE — BH Assessment (Addendum)
08/27/15, 1125: TTS met with this pt to reassess.  Pt sleeping and slow to respond, then quite irritable.  Pt reports she is "doing good and ready to go home."  She said she is ready to "start a new life without her husband."  Pt denies SI, HI, AV to this counselor.  Pt talking very fast, very difficult to understand, and irritable when TTS asked her to repeat herself.  When told by TTS that the MD was not going to release her today, she put her sheet over her head and refused to respond. Dr Darleene Cleaver continues to recommend inpt treatment for this pt in 500 hall bed. Lurline Idol, LCSW

## 2015-08-27 NOTE — Progress Notes (Signed)
1:1 initiated because [name] would not stop scratching her arm with the intent to self harm. Area was increasingly reddened. She would stop scratching intermittently, only to start again if staff did not give her attention. Pt was only minimally receptive to redirection. It became clear she was intent on harming herself and would not stop without constant staff supervision. 10 mg Zyprexa and 50 mg Benadryl PRN PO given. [place] MHT now sitting with pt. Pt remains safe and free from harm.

## 2015-08-27 NOTE — ED Notes (Signed)
Patient is resting

## 2015-08-27 NOTE — ED Notes (Signed)
[name] has remained in bed with eyes closed for most of the a.m. She awakened briefly when a [place] spoke with her and this Probation officer gave meds, which pt took without complaint or issue. During that time, she muttered rapidly about her situation, including her husband, her pregnancy, and her desire to go home. Much did not make sense. This Probation officer encouraged her to get more rest. Pt was displeased with situation but did lie down. Will continue to monitor for needs/safety.

## 2015-08-27 NOTE — ED Notes (Signed)
Patient noted sleeping in room with sitter at bedside. No complaints, stable, in no acute distress. Q15 minute rounds and monitoring via [employer] to continue.

## 2015-08-28 ENCOUNTER — Encounter (HOSPITAL_COMMUNITY): Payer: Self-pay | Admitting: Emergency Medicine

## 2015-08-28 DIAGNOSIS — F250 Schizoaffective disorder, bipolar type: Secondary | ICD-10-CM

## 2015-08-28 DIAGNOSIS — O26891 Other specified pregnancy related conditions, first trimester: Secondary | ICD-10-CM | POA: Diagnosis not present

## 2015-08-28 MED ORDER — ACETAMINOPHEN 325 MG PO TABS
650.0000 mg | ORAL_TABLET | Freq: Four times a day (QID) | ORAL | Status: DC | PRN
  Administered 2015-08-28 – 2015-08-29 (×2): 650 mg via ORAL
  Filled 2015-08-28 (×2): qty 2

## 2015-08-28 MED ORDER — DIPHENHYDRAMINE HCL 25 MG PO CAPS
50.0000 mg | ORAL_CAPSULE | Freq: Two times a day (BID) | ORAL | Status: DC
  Administered 2015-08-28: 50 mg via ORAL

## 2015-08-28 MED ORDER — LEVETIRACETAM 500 MG PO TABS
500.0000 mg | ORAL_TABLET | Freq: Two times a day (BID) | ORAL | Status: DC
  Administered 2015-08-28 – 2015-08-29 (×3): 500 mg via ORAL
  Filled 2015-08-28 (×5): qty 1

## 2015-08-28 MED ORDER — DIPHENHYDRAMINE HCL 25 MG PO CAPS
25.0000 mg | ORAL_CAPSULE | Freq: Two times a day (BID) | ORAL | Status: DC
  Administered 2015-08-28 – 2015-08-29 (×2): 25 mg via ORAL
  Filled 2015-08-28 (×2): qty 1

## 2015-08-28 NOTE — Consult Note (Signed)
[facility] Psych ED Progress Note  08/28/2015 12:11 PM [name]  MRN:  [id number] Subjective: Patient seen, chart reviewed and treatment plan discussed with the team. Patient who was admitted for suicidal thought after her fiance left her. She remains upset, apprehensive, moody, irritable with passive suicidal thoughts. She was reported to be scratching herself yesterday but patient has not been self mutilating today. Patient has limited insight into her problem. She is requesting to be discharged even though she is exercising poor judgment.  Principal Problem: Schizoaffective disorder, bipolar type ([place]) Diagnosis:   Patient Active Problem List   Diagnosis Date Noted  . Schizoaffective disorder, bipolar type by history(HCC) [F25.0] 03/10/2014    Priority: High  . Borderline personality disorder [F60.3] 03/10/2014    Priority: High  . PTSD (post-traumatic stress disorder) [F43.10] 03/10/2014    Priority: High  . Rh negative, antepartum [O36.0190] 08/10/2015  . Supervision of high risk pregnancy in first trimester [O09.91] 08/08/2015  . Seizure disorder ([place]) [id number] 08/08/2015  . Seizure disorder in pregnancy, antepartum ([place]) [O99.350, G40.909] 08/08/2015  . Chronic migraine [G43.709] 07/27/2015  . Vitamin D deficiency [E55.9] 07/18/2015  . Pregnancy [Z33.1] 07/10/2015  . Asthma [J45.909] 04/15/2015  . Convulsion ([place]) [R56.9] 04/12/2015   Total Time spent with patient: 30 minutes  Past Psychiatric History: multiple prior inpatient admissions.  Past Medical History:  Past Medical History:  Diagnosis Date  . Acid reflux   . Asthma   . Autism   . 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 Chronic constipation   . Depression   . Drug-seeking behavior   . Essential tremor   . Personality disorder   . Schizo-affective psychosis ([place])   . Schizoaffective disorder, bipolar type ([place])   . Seizures ([place])     Past Surgical History:  Procedure Laterality Date  . MOUTH SURGERY      Family History:  Family History  Problem Relation Age of Onset  . Mental illness Father   . Asthma Father   . PDD Brother   . Seizures Brother    Family Psychiatric  History:  Social History:  History  Alcohol Use  . 0.6 oz/week  . 1 Standard drinks or equivalent per week    Comment: "not with preg"     History  Drug Use No    Comment: History of cocaine use at age 3 for 4 months    Social History   Social History  . Marital status: Married    Spouse name: N/A  . Number of children: 0  . Years of education: N/A   Occupational History  . disability    Social History Main Topics  . Smoking status: Former Smoker    Packs/day: 0.00  . Smokeless tobacco: None     Comment: Smoked for 2  years age [age]  . Alcohol use 0.6 oz/week    1 Standard drinks or equivalent per week     Comment: "not with preg"  . Drug use: No     Comment: History of cocaine use at age 81 for 4 months  . Sexual activity: Yes    Birth control/ protection: None   Other Topics Concern  . None   Social History Narrative   Marital status: married; guardian is Furniture conservator/restorer      Children: none      Lives: with husband in apartment      Employment:  Disability      Tobacco: quit smoking; smoked for two  years.      Alcohol ;none      Drugs: none            Sleep: Fair  Appetite:  Fair  Current Medications: Current Facility-Administered Medications  Medication Dose Route Frequency Provider Last Rate Last Dose  . cholecalciferol (VITAMIN D) tablet 800 Units  800 Units Oral Daily Lurena Nida, NP   800 Units at 08/28/15 0915  . diphenhydrAMINE (BENADRYL) capsule 25 mg  25 mg Oral BID Elara Cocke, MD      . diphenhydrAMINE (BENADRYL) capsule 50 mg  50 mg Oral QHS PRN Sherwood Gambler, MD   50 mg at 08/27/15 2140  . docusate sodium (COLACE) capsule 200 mg  200 mg Oral Daily Lurena Nida, NP   200 mg at 08/28/15 0915  . gabapentin (NEURONTIN) capsule 200 mg  200 mg Oral TID [name], MD   200 mg at 08/28/15 0915  . levETIRAcetam (KEPPRA) tablet 500 mg  500 mg Oral BID Lurena Nida, NP   500 mg at 08/28/15 [id number]  . OLANZapine zydis (ZYPREXA) disintegrating tablet 10 mg  10 mg Oral QHS [name], MD   10 mg at 08/27/15 2140  . OLANZapine zydis (ZYPREXA) disintegrating tablet 10 mg  10 mg Oral Q8H PRN [name], MD   10 mg at 08/28/15 1000  . prenatal multivitamin tablet 1 tablet  1 tablet Oral QHS Lurena Nida, NP   1 tablet at 08/27/15 2139  . senna (SENOKOT) tablet 17.2 mg  2 tablet Oral Daily Lurena Nida, NP   17.2 mg at 08/27/15 2139   Current Outpatient Prescriptions  Medication Sig Dispense Refill  . acetaminophen (TYLENOL) 500 MG tablet Take 500 mg by mouth every 6 (six) hours as needed for mild pain.    Marland Kitchen albuterol (PROVENTIL HFA;VENTOLIN HFA) 108 (90 Base) MCG/ACT inhaler Inhale 1-2 puffs into the lungs every 6 (six) hours as needed for wheezing or shortness of breath. Reported on 05/05/2015 1 Inhaler 1  . calcium carbonate (TUMS - DOSED IN MG ELEMENTAL CALCIUM) 500 MG chewable tablet Chew 1 tablet by mouth daily.    . cholecalciferol (VITAMIN D-400) 400 UNITS TABS tablet Take 800 Units by mouth daily.     Marland Kitchen docusate sodium (COLACE) 100 MG capsule Take 200 mg by mouth daily.     Marland Kitchen doxylamine, Sleep, (UNISOM) 25 MG tablet Take 25 mg by mouth at bedtime as needed for sleep.    . fluticasone-salmeterol (ADVAIR HFA) 115-21 MCG/ACT inhaler Inhale 2 puffs into the lungs 2 (two) times daily. 1 Inhaler 5  . levETIRAcetam (KEPPRA) 500 MG tablet Take 1 tablet (500 mg total) by mouth 2 (two) times daily. 60 tablet 5  . Menthol, Topical Analgesic, (BENGAY EX) Apply 1 application topically daily as needed (pain).     Marland Kitchen PRENATAL 27-1 MG TABS TAKE 1 TABLET BY MOUTH AT BEDTIME 30 tablet 12  . Pyridoxine HCl (B-6) 50 MG TABS Take 100 mg by mouth 2 (two) times daily.     Marland Kitchen senna (SENOKOT) 8.6 MG tablet Take 2 tablets by mouth daily.      Lab Results: No  results found for this or any previous visit (from the past 48 hour(s)).  Blood Alcohol level:  Lab Results  Component Value Date   [facility] <5 08/26/2015   ETH <5 07/27/2015    Physical Findings: AIMS:  , ,  ,  ,    CIWA:    COWS:  Musculoskeletal: Strength & Muscle Tone: within normal limits Gait & Station: normal Patient leans: N/A  Psychiatric Specialty Exam: Physical Exam  Psychiatric: Her affect is labile. Her speech is rapid and/or pressured. She is hyperactive. Cognition and memory are impaired. She expresses impulsivity. She expresses suicidal ideation.    Review of Systems  Constitutional: Negative.   HENT: Negative.   Eyes: Negative.   Respiratory: Negative.   Cardiovascular: Negative.   Gastrointestinal: Negative.   Genitourinary: Negative.   Musculoskeletal: Negative.   Skin: Negative.   Neurological: Negative.   Endo/Heme/Allergies: Negative.   Psychiatric/Behavioral: Positive for hallucinations and suicidal ideas. The patient is nervous/anxious and has insomnia.     Blood pressure (!) 97/46, pulse 86, temperature 98.3 F (36.8 C), temperature source Oral, resp. rate 18, last menstrual period 06/07/2015, SpO2 100 %.There is no height or weight on file to calculate BMI.  General Appearance: Casual  Eye Contact:  Fair  Speech:  Pressured  Volume:  Increased  Mood:  Angry and Irritable  Affect:  Labile  Thought Process:  Disorganized  Orientation:  Full (Time, Place, and Person)  Thought Content:  Paranoid Ideation and Rumination  Suicidal Thoughts:  passive suicidal thoughts  Homicidal Thoughts:  No  Memory:  Immediate;   Good Recent;   Good Remote;   Fair  Judgement:  Impaired  Insight:  Shallow  Psychomotor Activity:  Increased  Concentration:  Concentration: Fair and Attention Span: Fair  Recall:  [employer] of Knowledge:  Fair  Language:  Good  Akathisia:  No  Handed:  Right  AIMS (if indicated):     Assets:  Communication Skills  ADL's:   Intact  Cognition:  WNL  Sleep:   fair      Treatment Plan Summary: Plan: -Daily contact with patient to assess and evaluate symptoms and progress in treatment - Medication management. -Continue Zyprexa Zydis 10mg  qhs for delusion/mood -Increase Benadryl to 25mg  bid for EPS prevention. -Awaiting placement in inpatient psychiatric facility.  [name], MD 08/28/2015, 12:11 PM

## 2015-08-28 NOTE — ED Notes (Signed)
Patient's mother is visiting.  Mother is upset because she feel like she is unable to see her daughter for some reason or another.  Patient was asleep when mother requested to come back.  Patient stated that she would speak to a supervisor because staff also was hanging up on her.

## 2015-08-28 NOTE — ED Notes (Signed)
Patient noted sleeping with sitter in room. No complaints, stable, in no acute distress. Q15 minute rounds and monitoring via [employer] to continue.

## 2015-08-28 NOTE — ED Notes (Signed)
Tech back to regular duties after pt. Agrees to stay calm.

## 2015-08-28 NOTE — Progress Notes (Signed)
Patient's 1:1 discontinued.  Patient is able to contract for safety.  Also benedryl ordered to be given with her zyprexa.  Patient became upset when she couldn't use the phone.  In bed sobbing and angry.

## 2015-08-28 NOTE — ED Notes (Signed)
Patient noted sleeping in room. No complaints, stable, in no acute distress. Q15 minute rounds and monitoring via [employer] to continue.  

## 2015-08-28 NOTE — ED Notes (Signed)
Patient requesting to be off 1:1.  Informed patient she would remain on 1:1 for safety until the MD decides she is able to control her scratching behaviors.  Patient continues to state that her husband left her and she's pregnant.  She states, "my husband is hearing voices so he left.  He's in a hospital somewhere and my mother-in-law will not let me know where he is."  Patient is lying in bed.  Her speech is rapid and pressured.  At time, she is incoherent because she is talking so fast.  She contracts for safety at this time.

## 2015-08-28 NOTE — ED Notes (Signed)
Patient noted sleeping in room with sitter at bedside. No complaints, stable, in no acute distress. Q15 minute rounds and monitoring via [employer] to continue.

## 2015-08-28 NOTE — ED Notes (Signed)
Patient irritable.  She requested zyprexa and refused to take it without the benedryl.  Informed her the benedryl was ordered for bedtime.  Holding zyprexa if drawer if patient decides to take it.

## 2015-08-28 NOTE — ED Notes (Signed)
Report from [employer]. Patient sleeping, respirations regular and unlabored. Q15 minute rounds and security camera observation to continue.

## 2015-08-28 NOTE — ED Notes (Signed)
Patient noted in room talking to [facility] and this nurse about treatment plan. Stable, in no acute distress. Q15 minute rounds and monitoring via [employer] to continue.

## 2015-08-28 NOTE — ED Notes (Signed)
Pt. Crying in room

## 2015-08-28 NOTE — ED Notes (Signed)
Tech in to sit with patient.

## 2015-08-28 NOTE — ED Notes (Signed)
Patient noted in room. No complaints, stable, in no acute distress. Q15 minute rounds and monitoring via Security Cameras to continue.  

## 2015-08-29 ENCOUNTER — Encounter (HOSPITAL_COMMUNITY): Payer: Self-pay

## 2015-08-29 DIAGNOSIS — Z87891 Personal history of nicotine dependence: Secondary | ICD-10-CM | POA: Insufficient documentation

## 2015-08-29 DIAGNOSIS — F250 Schizoaffective disorder, bipolar type: Secondary | ICD-10-CM | POA: Diagnosis not present

## 2015-08-29 DIAGNOSIS — F840 Autistic disorder: Secondary | ICD-10-CM

## 2015-08-29 DIAGNOSIS — R440 Auditory hallucinations: Secondary | ICD-10-CM

## 2015-08-29 DIAGNOSIS — R45851 Suicidal ideations: Secondary | ICD-10-CM | POA: Insufficient documentation

## 2015-08-29 DIAGNOSIS — F329 Major depressive disorder, single episode, unspecified: Secondary | ICD-10-CM | POA: Insufficient documentation

## 2015-08-29 DIAGNOSIS — O99342 Other mental disorders complicating pregnancy, second trimester: Secondary | ICD-10-CM | POA: Insufficient documentation

## 2015-08-29 DIAGNOSIS — O99282 Endocrine, nutritional and metabolic diseases complicating pregnancy, second trimester: Secondary | ICD-10-CM | POA: Insufficient documentation

## 2015-08-29 DIAGNOSIS — E876 Hypokalemia: Secondary | ICD-10-CM | POA: Insufficient documentation

## 2015-08-29 DIAGNOSIS — J45909 Unspecified asthma, uncomplicated: Secondary | ICD-10-CM

## 2015-08-29 DIAGNOSIS — Z3A16 16 weeks gestation of pregnancy: Secondary | ICD-10-CM

## 2015-08-29 DIAGNOSIS — O26891 Other specified pregnancy related conditions, first trimester: Secondary | ICD-10-CM | POA: Diagnosis not present

## 2015-08-29 LAB — COMPREHENSIVE METABOLIC PANEL
ALT: 18 U/L (ref 14–54)
ANION GAP: 7 (ref 5–15)
AST: 21 U/L (ref 15–41)
Albumin: 3.7 g/dL (ref 3.5–5.0)
Alkaline Phosphatase: 37 U/L — ABNORMAL LOW (ref 38–126)
BUN: 5 mg/dL — ABNORMAL LOW (ref 6–20)
CHLORIDE: 108 mmol/L (ref 101–111)
CO2: 22 mmol/L (ref 22–32)
CREATININE: 0.57 mg/dL (ref 0.44–1.00)
Calcium: 9.3 mg/dL (ref 8.9–10.3)
Glucose, Bld: 113 mg/dL — ABNORMAL HIGH (ref 65–99)
Potassium: 3.3 mmol/L — ABNORMAL LOW (ref 3.5–5.1)
SODIUM: 137 mmol/L (ref 135–145)
Total Bilirubin: 0.3 mg/dL (ref 0.3–1.2)
Total Protein: 6.5 g/dL (ref 6.5–8.1)

## 2015-08-29 LAB — CBC
HCT: 36.7 % (ref 36.0–46.0)
Hemoglobin: 12.1 g/dL (ref 12.0–15.0)
MCH: 28.3 pg (ref 26.0–34.0)
MCHC: 33 g/dL (ref 30.0–36.0)
MCV: 85.9 fL (ref 78.0–100.0)
PLATELETS: 220 10*3/uL (ref 150–400)
RBC: 4.27 MIL/uL (ref 3.87–5.11)
RDW: 12.8 % (ref 11.5–15.5)
WBC: 10.9 10*3/uL — ABNORMAL HIGH (ref 4.0–10.5)

## 2015-08-29 LAB — RAPID URINE DRUG SCREEN, HOSP PERFORMED
Amphetamines: NOT DETECTED
Barbiturates: NOT DETECTED
Benzodiazepines: NOT DETECTED
COCAINE: NOT DETECTED
OPIATES: NOT DETECTED
Tetrahydrocannabinol: NOT DETECTED

## 2015-08-29 LAB — ACETAMINOPHEN LEVEL

## 2015-08-29 LAB — SALICYLATE LEVEL

## 2015-08-29 LAB — ETHANOL

## 2015-08-29 NOTE — BHH Suicide Risk Assessment (Signed)
Suicide Risk Assessment  Discharge Assessment   [facility] Discharge Suicide Risk Assessment   Principal Problem: Schizoaffective disorder, bipolar type [facility]) Discharge Diagnoses:  Patient Active Problem List   Diagnosis Date Noted  . Schizoaffective disorder, bipolar type by history(HCC) [F25.0] 03/10/2014    Priority: High  . Rh negative, antepartum [O36.0190] 08/10/2015  . Supervision of high risk pregnancy in first trimester [O09.91] 08/08/2015  . Seizure disorder ([place]) [id number] 08/08/2015  . Seizure disorder in pregnancy, antepartum ([place]) [O99.350, G40.909] 08/08/2015  . Chronic migraine [G43.709] 07/27/2015  . Vitamin D deficiency [E55.9] 07/18/2015  . Pregnancy [Z33.1] 07/10/2015  . Asthma [J45.909] 04/15/2015  . Convulsion ([place]) [R56.9] 04/12/2015  . Borderline personality disorder [F60.3] 03/10/2014  . PTSD (post-traumatic stress disorder) [F43.10] 03/10/2014    Total Time spent with patient: 30 minutes  Musculoskeletal: Strength & Muscle Tone: within normal limits Gait & Station: normal Patient leans: N/A  Psychiatric Specialty Exam: Physical Exam  Constitutional: She is oriented to person, place, and time. She appears well-developed and well-nourished.  HENT:  Head: Normocephalic.  Neck: Normal range of motion.  Respiratory: Effort normal.  Musculoskeletal: Normal range of motion.  Neurological: She is oriented to person, place, and time.  Skin: Skin is warm and dry.  Psychiatric: Her speech is normal. Judgment and thought content normal. Her mood appears anxious. Cognition and memory are normal.    Review of Systems  Constitutional: Negative.   HENT: Negative.   Eyes: Negative.   Respiratory: Negative.   Cardiovascular: Negative.   Gastrointestinal: Negative.   Genitourinary: Negative.   Musculoskeletal: Negative.   Skin: Negative.   Neurological: Negative.   Endo/Heme/Allergies: Negative.   Psychiatric/Behavioral: The patient is nervous/anxious.      Blood pressure 113/68, pulse 75, temperature 98.7 F (37.1 C), temperature source Oral, resp. rate 20, last menstrual period 06/07/2015, SpO2 98 %.There is no height or weight on file to calculate BMI.  General Appearance: Casual  Eye Contact:  Good  Speech:  Normal  Volume:  Normal  Mood:  Anxious  Affect:  Congruent  Thought Process:  Clear and coherent  Orientation:  Full (Time, Place, and Person)  Thought Content:  WDL  Suicidal Thoughts:  No  Homicidal Thoughts:  No  Memory:  Immediate;   Good Recent;   Good Remote;   Fair  Judgement:  Fair  Insight:  Fair  Psychomotor Activity:  Normal  Concentration:  Concentration: Fair and Attention Span: Fair  Recall:  Good  Fund of Knowledge:  Fair  Language:  Good  Akathisia:  No  Handed:  Right  AIMS (if indicated):     Assets:  Communication Skills     Mental Status Per Nursing Assessment::   On Admission:   mania  Demographic Factors:  Adolescent or young adult and Caucasian  Loss Factors: NA  Historical Factors: NA  Risk Reduction Factors:   Pregnancy, Sense of responsibility to family, Living with another person, especially a relative, Positive social support and Positive therapeutic relationship  Continued Clinical Symptoms:  Anxiety, mild  Cognitive Features That Contribute To Risk:  None    Suicide Risk:  Minimal: No identifiable suicidal ideation.  Patients presenting with no risk factors but with morbid ruminations; may be classified as minimal risk based on the severity of the depressive symptoms    Plan Of Care/Follow-up recommendations:  Activity:  as tolerated Diet:  heart healthy diet  LORD, JAMISON, NP 08/29/2015, 9:55 AM

## 2015-08-29 NOTE — ED Notes (Signed)
Patient noted sleeping in room. No complaints, stable, in no acute distress. Q15 minute rounds and monitoring via [employer] to continue.  

## 2015-08-29 NOTE — ED Notes (Signed)
Pt states is 4 months pregnant.

## 2015-08-29 NOTE — ED Notes (Signed)
Pt mumbling to self. Unable to understand speech. Easily redirectable.

## 2015-08-29 NOTE — ED Triage Notes (Signed)
Pt states recent family issues. States seen at [facility] recently. Pt asking for psychiatric placement. Pt states paranoid of being alone at night. Hears voices telling her to kill self. States "feels like cutting arm." Pt's speech very pressured and rapid at triage.

## 2015-08-29 NOTE — BH Assessment (Signed)
[place] Assessment Progress Note This Probation officer attempted to contact [facility] ACT team case manager Ernest Haber [phone] to inform her [name] had been discharged this date and was provided transportation to her residence by her mother in law. Hapli could not be reached as this Probation officer left a message in reference to patient's discharge information.

## 2015-08-29 NOTE — Discharge Instructions (Signed)
For your ongoing mental health needs, you are advised to continue treatment with Beverly Sessions, your ACT Team provider.  The number below also serves as a 24 hour crisis number:       [facility]      [address]      [place], [place] [address]      [phone]

## 2015-08-29 NOTE — Progress Notes (Signed)
25 yr  Old female with 14 ED visits and 2 admissions in the last 6 months not [facility] eligible No ED CP Pt has a legal guardian per EPIC but no name is listed   During ED CM visit to [facility] unit Pt was noted to be lying on the hallway floor near her room - No reason [facility] RN aware and ED CM discussed it with RN Pt in for SI previously at ED refusing to speak with others, screaming -refusing to leave earlier because husband was at [facility] and she did not want to go home alone   Entered in d/c instructions [facility] PCP - General Family Medicine       Next Steps: Schedule an appointment as soon as possible for a visit on 08/29/2015    Instructions: This is your assigned Medicaid [place] access doctor If you prefer to see another Medicaid doctor other than the one on your Medicaid card [place] [phone] or [phone]     medicaid [place] access patient     As a Medicaid client you MUST contact DSS/SSI each time you change address, move to another [place] or another state to keep your address updated  [place] Medicaid Transportation to Dr appts if you are have full Medicaid: [phone], [phone]    Instructions: [name]: [place], [place] [address] [email or web address] Use this website to assist with understanding your coverage & to renew application

## 2015-08-29 NOTE — BH Assessment (Addendum)
[place] Assessment Progress Note This Probation officer and Camera operator spoke with patient this date to discuss discharge plans. This Probation officer and RN who was present, ask patient if she was safe to be discharged and could contract for safety. Patient stated she did not have any thoughts of self harm denying any S/I, H/I or thoughts of harm to others. Patient stated she was ready to be discharged and had contacted her mother in mother to transport to her residence on discharge.

## 2015-08-29 NOTE — Consult Note (Signed)
[facility] Psych ED Progress Note  08/29/2015 8:44 AM Evelyn DRESSEL  MRN:  [id number]    Subjective:   Patient has been asking to leave since she came to the ED.  The psychiatrist saw her since admission and told her yesterday if she stayed calm from yesterday until today, she could discharge.  Today, the patient is not suicidal/homicidal, no hallucinations.  She is stable for discharge but does not want to return to her home.  Her ACT via Beverly Sessions will be notified of her discharge or her guardian.  Principal Problem: Schizoaffective disorder, bipolar type ([place]) Diagnosis:   Patient Active Problem List   Diagnosis Date Noted  . Schizoaffective disorder, bipolar type by history(HCC) [F25.0] 03/10/2014    Priority: High  . Rh negative, antepartum [O36.0190] 08/10/2015  . Supervision of high risk pregnancy in first trimester [O09.91] 08/08/2015  . Seizure disorder ([place]) [id number] 08/08/2015  . Seizure disorder in pregnancy, antepartum ([place]) [O99.350, G40.909] 08/08/2015  . Chronic migraine [G43.709] 07/27/2015  . Vitamin D deficiency [E55.9] 07/18/2015  . Pregnancy [Z33.1] 07/10/2015  . Asthma [J45.909] 04/15/2015  . Convulsion ([place]) [R56.9] 04/12/2015  . Borderline personality disorder [F60.3] 03/10/2014  . PTSD (post-traumatic stress disorder) [F43.10] 03/10/2014   Total Time spent with patient: 30 minutes  Past Psychiatric History: multiple prior inpatient admissions.  Past Medical History:  Past Medical History:  Diagnosis Date  . Acid reflux   . Asthma   . Autism   . 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 Chronic constipation   . Depression   . Drug-seeking behavior   . Essential tremor   . Personality disorder   . Schizo-affective psychosis ([place])   . Schizoaffective disorder, bipolar type ([name])   . Seizures ([place])     Past Surgical History:  Procedure Laterality Date  . MOUTH SURGERY     Family History:  Family History  Problem Relation Age of Onset  . Mental illness  Father   . Asthma Father   . PDD Brother   . Seizures Brother    Family Psychiatric  History:  Social History:  History  Alcohol Use  . 0.6 oz/week  . 1 Standard drinks or equivalent per week    Comment: "not with preg"     History  Drug Use No    Comment: History of cocaine use at age 16 for 4 months    Social History   Social History  . Marital status: Married    Spouse name: N/A  . Number of children: 0  . Years of education: N/A   Occupational History  . disability    Social History Main Topics  . Smoking status: Former Smoker    Packs/day: 0.00  . Smokeless tobacco: None     Comment: Smoked for 2  years age [age]  . Alcohol use 0.6 oz/week    1 Standard drinks or equivalent per week     Comment: "not with preg"  . Drug use: No     Comment: History of cocaine use at age 63 for 4 months  . Sexual activity: Yes    Birth control/ protection: None   Other Topics Concern  . None   Social History Narrative   Marital status: married; guardian is Furniture conservator/restorer      Children: none      Lives: with husband in apartment      Employment:  Disability      Tobacco: quit smoking; smoked for two years.  Alcohol ;none      Drugs: none            Sleep: Good  Appetite:  Good  Current Medications: Current Facility-Administered Medications  Medication Dose Route Frequency Provider Last Rate Last Dose  . acetaminophen (TYLENOL) tablet 650 mg  650 mg Oral Q6H PRN Serena Colonel, RN   650 mg at 08/29/15 0518  . cholecalciferol (VITAMIN D) tablet 800 Units  800 Units Oral Daily Lurena Nida, NP   800 Units at 08/28/15 0915  . diphenhydrAMINE (BENADRYL) capsule 25 mg  25 mg Oral BID [name], MD   25 mg at 08/28/15 2103  . diphenhydrAMINE (BENADRYL) capsule 50 mg  50 mg Oral QHS PRN Sherwood Gambler, MD   50 mg at 08/27/15 2140  . docusate sodium (COLACE) capsule 200 mg  200 mg Oral Daily Lurena Nida, NP   200 mg at 08/28/15 0915  . gabapentin (NEURONTIN) capsule  200 mg  200 mg Oral TID [name], MD   200 mg at 08/28/15 2102  . levETIRAcetam (KEPPRA) tablet 500 mg  500 mg Oral BID Lurena Nida, NP   500 mg at 08/28/15 2106  . OLANZapine zydis (ZYPREXA) disintegrating tablet 10 mg  10 mg Oral QHS [name], MD   10 mg at 08/28/15 2103  . OLANZapine zydis (ZYPREXA) disintegrating tablet 10 mg  10 mg Oral Q8H PRN [name], MD   10 mg at 08/28/15 1000  . prenatal multivitamin tablet 1 tablet  1 tablet Oral QHS Lurena Nida, NP   1 tablet at 08/28/15 2102  . senna (SENOKOT) tablet 17.2 mg  2 tablet Oral Daily Lurena Nida, NP   17.2 mg at 08/28/15 2103   Current Outpatient Prescriptions  Medication Sig Dispense Refill  . acetaminophen (TYLENOL) 500 MG tablet Take 500 mg by mouth every 6 (six) hours as needed for mild pain.    Marland Kitchen albuterol (PROVENTIL HFA;VENTOLIN HFA) 108 (90 Base) MCG/ACT inhaler Inhale 1-2 puffs into the lungs every 6 (six) hours as needed for wheezing or shortness of breath. Reported on 05/05/2015 1 Inhaler 1  . calcium carbonate (TUMS - DOSED IN MG ELEMENTAL CALCIUM) 500 MG chewable tablet Chew 1 tablet by mouth daily.    . cholecalciferol (VITAMIN D-400) 400 UNITS TABS tablet Take 800 Units by mouth daily.     Marland Kitchen docusate sodium (COLACE) 100 MG capsule Take 200 mg by mouth daily.     Marland Kitchen doxylamine, Sleep, (UNISOM) 25 MG tablet Take 25 mg by mouth at bedtime as needed for sleep.    . fluticasone-salmeterol (ADVAIR HFA) 115-21 MCG/ACT inhaler Inhale 2 puffs into the lungs 2 (two) times daily. 1 Inhaler 5  . levETIRAcetam (KEPPRA) 500 MG tablet Take 1 tablet (500 mg total) by mouth 2 (two) times daily. 60 tablet 5  . Menthol, Topical Analgesic, (BENGAY EX) Apply 1 application topically daily as needed (pain).     Marland Kitchen PRENATAL 27-1 MG TABS TAKE 1 TABLET BY MOUTH AT BEDTIME 30 tablet 12  . Pyridoxine HCl (B-6) 50 MG TABS Take 100 mg by mouth 2 (two) times daily.     Marland Kitchen senna (SENOKOT) 8.6 MG tablet Take 2 tablets by mouth daily.       Lab Results: No results found for this or any previous visit (from the past 48 hour(s)).  Blood Alcohol level:  Lab Results  Component Value Date   ETH <5 08/26/2015   ETH <5 07/27/2015  Physical Findings: AIMS:  , ,  ,  ,    CIWA:    COWS:     Musculoskeletal: Strength & Muscle Tone: within normal limits Gait & Station: normal Patient leans: N/A  Psychiatric Specialty Exam: Physical Exam  Constitutional: She is oriented to person, place, and time. She appears well-developed and well-nourished.  HENT:  Head: Normocephalic.  Neck: Normal range of motion.  Respiratory: Effort normal.  Musculoskeletal: Normal range of motion.  Neurological: She is oriented to person, place, and time.  Skin: Skin is warm and dry.  Psychiatric: Her speech is normal. Judgment and thought content normal. Her mood appears anxious. Cognition and memory are normal.    Review of Systems  Constitutional: Negative.   HENT: Negative.   Eyes: Negative.   Respiratory: Negative.   Cardiovascular: Negative.   Gastrointestinal: Negative.   Genitourinary: Negative.   Musculoskeletal: Negative.   Skin: Negative.   Neurological: Negative.   Endo/Heme/Allergies: Negative.   Psychiatric/Behavioral: The patient is nervous/anxious.     Blood pressure 113/68, pulse 75, temperature 98.7 F (37.1 C), temperature source Oral, resp. rate 20, last menstrual period 06/07/2015, SpO2 98 %.There is no height or weight on file to calculate BMI.  General Appearance: Casual  Eye Contact:  Good  Speech:  Normal  Volume:  Normal  Mood:  Anxious  Affect:  Congruent  Thought Process:  Clear and coherent  Orientation:  Full (Time, Place, and Person)  Thought Content:  WDL  Suicidal Thoughts:  No  Homicidal Thoughts:  No  Memory:  Immediate;   Good Recent;   Good Remote;   Fair  Judgement:  Fair  Insight:  Fair  Psychomotor Activity:  Normal  Concentration:  Concentration: Fair and Attention Span: Fair   Recall:  Good  Fund of Knowledge:  Fair  Language:  Good  Akathisia:  No  Handed:  Right  AIMS (if indicated):     Assets:  Communication Skills  ADL's:  Intact  Cognition:  WNL  Sleep:   Good    Treatment Plan Summary: Plan: -Daily contact with patient to assess and evaluate symptoms and progress in treatment - Medication management:  Continue Zyprexa Zydis 10mg  qhs for delusion/mood, Benadryl 25mg  bid for EPS prevention. -Coordination of care with [facility] ACT team and guardian  Waylan Boga, NP 08/29/2015, 8:44 AM  Patient seen face-to-face for psychiatric evaluation, chart reviewed and case discussed with the physician extender and developed treatment plan. Reviewed the information documented and agree with the treatment plan. [name], MD

## 2015-08-29 NOTE — ED Notes (Signed)
Pt discharged ambulatory with mother in law.  Pt denied S/I and H/I to myself and another staff member.  She was in no distress at discharge.  She was calm and cooperative.  All belongings were returned to patient.  Discharge instructions were reviewed.

## 2015-08-29 NOTE — ED Notes (Signed)
ACT team here to assess patient.

## 2015-08-29 NOTE — BH Assessment (Signed)
[place] Assessment Progress Note  Per [name], MD, this pt does not require psychiatric hospitalization at this time.  Pt presents under IVC initiated by law enforcement on 08/26/2015 and upheld at that time by Dr Darleene Cleaver.  Pt is to be released from IVC and discharged from [facility] with recommendation to follow up with Beverly Sessions, her ACT Team treatment provider.  IVC has been rescinded.  Discharge instructions include [facility] ACT Team information.  At 10:13 pt's legal guardian, [name] from [facility], calls expressing his concern about plan to discharge pt.  I provided him with information about medication changes initiated by Dr Darleene Cleaver, as well as pt's clinical presentation today.  He asks that I call pt's ACT Team clinician, Barnett Applebaum [phone]), to ask her to come to [facility] to check and see if pt is at baseline.  At 10:25 this Probation officer called Barnett Applebaum.  She also expresses concerns about pt being discharged, but agrees to come to [facility] early in the afternoon to check on pt.  I also informed her about medication changes.  Pt's nurse, [name] Jordan, has been notified.  [name], [place] Triage Specialist [phone]

## 2015-08-29 NOTE — BH Assessment (Addendum)
[place] Assessment Progress Note This Probation officer spoke with patient's ACT team case manager from [facility] [phone] in reference to patient being discharged this date. Case manager presented to [facility] to assist with transporting patient to her residence this date on discharge. Although after case manager spoke to patient, she stated that she felt that patient was not ready to be discharged and declined to transport patient to her residence. Case manager also stated patient reported she was not ready to be discharged from the hospital and was asking for continued admission to further stabilize. This Probation officer contacted [facility] to staff case, with Reita Cliche stating that patient had been psychiatrically cleared and patient no longer met inpatient criteria and was appropriate for discharge. Lord stated patient could be referred to social work to assist with placement if patient did not desire to be discharged. Patient was informed of this information with ACT team case manager present and when patient was presented with the social work placement option, patient asked to be discharged. Patient denied any S/I, H/I and was able to contact for safety. Case manager continued to decline to transport patient. Patient stated she was going to contact her mother in law to provide transportation upon discharge. This Probation officer explained to case manger why patient no longer met inpatient criteria. Case manager continued to decline to transport patient.

## 2015-08-30 ENCOUNTER — Emergency Department (HOSPITAL_COMMUNITY)
Admission: EM | Admit: 2015-08-30 | Discharge: 2015-08-30 | Disposition: A | Discharge status: Home / Self Care | Payer: Medicare Other | Source: Home / Self Care | Attending: Emergency Medicine | Admitting: Emergency Medicine

## 2015-08-30 ENCOUNTER — Encounter (HOSPITAL_COMMUNITY): Payer: Self-pay | Admitting: Obstetrics and Gynecology

## 2015-08-30 DIAGNOSIS — R440 Auditory hallucinations: Secondary | ICD-10-CM

## 2015-08-30 DIAGNOSIS — R45851 Suicidal ideations: Secondary | ICD-10-CM

## 2015-08-30 DIAGNOSIS — E876 Hypokalemia: Secondary | ICD-10-CM

## 2015-08-30 DIAGNOSIS — O26891 Other specified pregnancy related conditions, first trimester: Secondary | ICD-10-CM | POA: Diagnosis not present

## 2015-08-30 LAB — HCG, QUANTITATIVE, PREGNANCY: hCG, Beta Chain, Quant, S: 75671 m[IU]/mL — ABNORMAL HIGH (ref <5)

## 2015-08-30 LAB — PREGNANCY, URINE: Preg Test, Ur: POSITIVE — AB

## 2015-08-30 MED ORDER — LEVETIRACETAM 500 MG PO TABS
500.0000 mg | ORAL_TABLET | Freq: Two times a day (BID) | ORAL | Status: DC
  Administered 2015-08-30 (×2): 500 mg via ORAL
  Filled 2015-08-30 (×2): qty 1

## 2015-08-30 MED ORDER — ACETAMINOPHEN 325 MG PO TABS: 650.0000 mg | ORAL_TABLET | ORAL | Status: DC | PRN

## 2015-08-30 MED ORDER — ALBUTEROL SULFATE HFA 108 (90 BASE) MCG/ACT IN AERS: 1.0000 | INHALATION_SPRAY | Freq: Four times a day (QID) | RESPIRATORY_TRACT | Status: DC | PRN

## 2015-08-30 MED ORDER — LORAZEPAM 1 MG PO TABS: 1.0000 mg | ORAL_TABLET | Freq: Three times a day (TID) | ORAL | Status: DC | PRN

## 2015-08-30 MED ORDER — ONDANSETRON HCL 4 MG PO TABS: 4.0000 mg | ORAL_TABLET | Freq: Three times a day (TID) | ORAL | Status: DC | PRN

## 2015-08-30 MED ORDER — SENNA 8.6 MG PO TABS
17.2000 mg | ORAL_TABLET | Freq: Every day | ORAL | Status: DC
  Administered 2015-08-30: 17.2 mg via ORAL
  Filled 2015-08-30: qty 2

## 2015-08-30 MED ORDER — VITAMIN B-6 100 MG PO TABS
100.0000 mg | ORAL_TABLET | Freq: Two times a day (BID) | ORAL | Status: DC
  Administered 2015-08-30: 100 mg via ORAL
  Filled 2015-08-30 (×2): qty 1

## 2015-08-30 MED ORDER — MOMETASONE FURO-FORMOTEROL FUM 200-5 MCG/ACT IN AERO
2.0000 | INHALATION_SPRAY | Freq: Two times a day (BID) | RESPIRATORY_TRACT | Status: DC
  Administered 2015-08-30: 2 via RESPIRATORY_TRACT
  Filled 2015-08-30: qty 8.8

## 2015-08-30 MED ORDER — ALUM & MAG HYDROXIDE-SIMETH 200-200-20 MG/5ML PO SUSP: 30.0000 mL | ORAL | Status: DC | PRN

## 2015-08-30 MED ORDER — VITAMIN D 1000 UNITS PO TABS
1000.0000 [IU] | ORAL_TABLET | Freq: Every day | ORAL | Status: DC
  Administered 2015-08-30: 1000 [IU] via ORAL
  Filled 2015-08-30: qty 1

## 2015-08-30 MED ORDER — ZOLPIDEM TARTRATE 5 MG PO TABS: 5.0000 mg | ORAL_TABLET | Freq: Every evening | ORAL | Status: DC | PRN

## 2015-08-30 MED ORDER — POTASSIUM CHLORIDE CRYS ER 20 MEQ PO TBCR
40.0000 meq | EXTENDED_RELEASE_TABLET | Freq: Once | ORAL | Status: AC
  Administered 2015-08-30: 40 meq via ORAL
  Filled 2015-08-30: qty 2

## 2015-08-30 MED ORDER — CALCIUM CARBONATE ANTACID 500 MG PO CHEW
1.0000 | CHEWABLE_TABLET | Freq: Every day | ORAL | Status: DC
  Filled 2015-08-30: qty 1

## 2015-08-30 MED ORDER — DOCUSATE SODIUM 100 MG PO CAPS
200.0000 mg | ORAL_CAPSULE | Freq: Every day | ORAL | Status: DC
  Administered 2015-08-30: 200 mg via ORAL
  Filled 2015-08-30: qty 2

## 2015-08-30 MED ORDER — IBUPROFEN 200 MG PO TABS: 600.0000 mg | ORAL_TABLET | Freq: Three times a day (TID) | ORAL | Status: DC | PRN

## 2015-08-30 MED ORDER — PRENATAL MULTIVITAMIN CH
1.0000 | ORAL_TABLET | Freq: Every day | ORAL | Status: DC
  Administered 2015-08-30: 1 via ORAL
  Filled 2015-08-30: qty 1

## 2015-08-30 NOTE — ED Provider Notes (Signed)
[facility] DEPT Provider Note   CSN: [id number] Arrival date & time: 08/29/15  2222  First Provider Contact:  First MD Initiated Contact with Patient 08/30/15 0444        History   Chief Complaint Chief Complaint  Patient presents with  . Hallucinations  . Suicidal    HPI [name] is a 25 y.o. female.  The history is provided by the patient.  As she is approximately [redacted] weeks pregnant-G2, P0, A1 (miscarriage). She came to the ED tonight because of suicidal thoughts. She had been at [facility] for 4 days and was discharged to the care of her ACT team. She states that she is home alone and is concerned because her husband is away for psychiatric care and may be away for a long time. She's not sure if he is coming back and she is very worried about taking care of even simple things like paying the bills. She expresses a desire to go to a psychiatric facility for group services. She does state that tonight she heard a voice telling her to kill herself. She does have suicidal thoughts but also states that she doesn't want to hurt the baby. She had expressed that she would take pills which she does have available at home. She also states that she feels that she should cut herself because she knows that won't injure the baby.  Past Medical History:  Diagnosis Date  . Acid reflux   . Asthma   . Autism   . 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 Chronic constipation   . Depression   . Drug-seeking behavior   . Essential tremor   . Personality disorder   . Schizo-affective psychosis ([place])   . Schizoaffective disorder, bipolar type ([place])   . Seizures [facility])     Patient Active Problem List   Diagnosis Date Noted  . Rh negative, antepartum 08/10/2015  . Supervision of high risk pregnancy in first trimester 08/08/2015  . Seizure disorder ([place]) 08/08/2015  . Seizure disorder in pregnancy, antepartum ([place]) 08/08/2015  . Chronic migraine 07/27/2015  . Vitamin D deficiency  07/18/2015  . Pregnancy 07/10/2015  . Asthma 04/15/2015  . Convulsion ([place]) 04/12/2015  . Schizoaffective disorder, bipolar type by history(HCC) 03/10/2014  . Borderline personality disorder 03/10/2014  . PTSD (post-traumatic stress disorder) 03/10/2014    Past Surgical History:  Procedure Laterality Date  . MOUTH SURGERY      OB History    Gravida Para Term Preterm AB Living   2       1     SAB TAB Ectopic Multiple Live Births   1               Home Medications    Prior to Admission medications   Medication Sig Start Date End Date Taking? Authorizing Provider  acetaminophen (TYLENOL) 500 MG tablet Take 500 mg by mouth every 6 (six) hours as needed for mild pain.    Historical Provider, MD  albuterol (PROVENTIL HFA;VENTOLIN HFA) 108 (90 Base) MCG/ACT inhaler Inhale 1-2 puffs into the lungs every 6 (six) hours as needed for wheezing or shortness of breath. Reported on 05/05/2015 07/20/15   Wendie Agreste, MD  calcium carbonate (TUMS - DOSED IN MG ELEMENTAL CALCIUM) 500 MG chewable tablet Chew 1 tablet by mouth daily.    Historical Provider, MD  cholecalciferol (VITAMIN D-400) 400 UNITS TABS tablet Take 800 Units by mouth daily.     Historical Provider, MD  docusate sodium (COLACE) 100 MG capsule Take 200 mg by mouth daily.     Historical Provider, MD  doxylamine, Sleep, (UNISOM) 25 MG tablet Take 25 mg by mouth at bedtime as needed for sleep.    Historical Provider, MD  fluticasone-salmeterol (ADVAIR HFA) 115-21 MCG/ACT inhaler Inhale 2 puffs into the lungs 2 (two) times daily. 08/03/15   Timmothy Euler, MD  levETIRAcetam (KEPPRA) 500 MG tablet Take 1 tablet (500 mg total) by mouth 2 (two) times daily. 07/27/15   Britt Bottom, MD  Menthol, Topical Analgesic, (BENGAY EX) Apply 1 application topically daily as needed (pain).     Historical Provider, MD  PRENATAL 27-1 MG TABS TAKE 1 TABLET BY MOUTH AT BEDTIME 08/16/15   Osborne Oman, MD  Pyridoxine HCl (B-6) 50 MG TABS Take  100 mg by mouth 2 (two) times daily.     Historical Provider, MD  senna (SENOKOT) 8.6 MG tablet Take 2 tablets by mouth daily.    Historical Provider, MD    Family History Family History  Problem Relation Age of Onset  . Mental illness Father   . Asthma Father   . PDD Brother   . Seizures Brother     Social History Social History  Substance Use Topics  . Smoking status: Former Smoker    Packs/day: 0.00  . Smokeless tobacco: Never Used     Comment: Smoked for 2  years age [age]  . Alcohol use 0.6 oz/week    1 Standard drinks or equivalent per week     Comment: "not with preg"     Allergies   Bee venom; Coconut flavor; Geodon [ziprasidone hcl]; Haloperidol and related; Lithium; Oxycodone; Seroquel [quetiapine fumarate]; Percocet [oxycodone-acetaminophen]; Prilosec [omeprazole]; Shellfish allergy; Tegretol [carbamazepine]; and Phenergan [promethazine hcl]   Review of Systems Review of Systems  All other systems reviewed and are negative.    Physical Exam Updated Vital Signs BP 127/62 (BP Location: Right Arm)   Pulse 88   Temp 98.4 F (36.9 C) (Oral)   Resp 16   LMP 06/07/2015   SpO2 98%   Physical Exam  Nursing note and vitals reviewed.   25 year old female, resting comfortably and in no acute distress. Vital signs are normal. Oxygen saturation is 98%, which is normal. Head is normocephalic and atraumatic. PERRLA, EOMI. Oropharynx is clear. Neck is nontender and supple without adenopathy or JVD. Back is nontender and there is no CVA tenderness. Lungs are clear without rales, wheezes, or rhonchi. Chest is nontender. Heart has regular rate and rhythm without murmur. Abdomen is soft, flat, nontender without masses or hepatosplenomegaly and peristalsis is normoactive. Fundus is present in the lower abdomen and nontender. Extremities have no cyanosis or edema, full range of motion is present. Skin is warm and dry without rash. Neurologic: Mental status is normal,  cranial nerves are intact, there are no motor or sensory deficits. Psychiatric: Very animated and does not appear overtly depressed. There is a tendency toward flight of ideas, but overall thought processes are normal.  ED Treatments / Results  Labs (all labs ordered are listed, but only abnormal results are displayed) Results for orders placed or performed during the hospital encounter of 08/30/15  Comprehensive metabolic panel  Result Value Ref Range   Sodium 137 135 - 145 mmol/L   Potassium 3.3 (L) 3.5 - 5.1 mmol/L   Chloride 108 101 - 111 mmol/L   CO2 22 22 - 32 mmol/L   Glucose, Bld 113 (H)  65 - 99 mg/dL   BUN 5 (L) 6 - 20 mg/dL   Creatinine, Ser 0.57 0.44 - 1.00 mg/dL   Calcium 9.3 8.9 - 10.3 mg/dL   Total Protein 6.5 6.5 - 8.1 g/dL   Albumin 3.7 3.5 - 5.0 g/dL   AST 21 15 - 41 U/L   ALT 18 14 - 54 U/L   Alkaline Phosphatase 37 (L) 38 - 126 U/L   Total Bilirubin 0.3 0.3 - 1.2 mg/dL   GFR calc non Af Amer >60 >60 mL/min   GFR calc Af Amer >60 >60 mL/min   Anion gap 7 5 - 15  Ethanol  Result Value Ref Range   Alcohol, Ethyl (B) <5 <5 mg/dL  Salicylate level  Result Value Ref Range   Salicylate Lvl 123456 2.8 - 30.0 mg/dL  Acetaminophen level  Result Value Ref Range   Acetaminophen (Tylenol), Serum <10 (L) 10 - 30 ug/mL  cbc  Result Value Ref Range   WBC 10.9 (H) 4.0 - 10.5 K/uL   RBC 4.27 3.87 - 5.11 MIL/uL   Hemoglobin 12.1 12.0 - 15.0 g/dL   HCT 36.7 36.0 - 46.0 %   MCV 85.9 78.0 - 100.0 fL   MCH 28.3 26.0 - 34.0 pg   MCHC 33.0 30.0 - 36.0 g/dL   RDW 12.8 11.5 - 15.5 %   Platelets 220 150 - 400 K/uL  Rapid urine drug screen (hospital performed)  Result Value Ref Range   Opiates NONE DETECTED NONE DETECTED   Cocaine NONE DETECTED NONE DETECTED   Benzodiazepines NONE DETECTED NONE DETECTED   Amphetamines NONE DETECTED NONE DETECTED   Tetrahydrocannabinol NONE DETECTED NONE DETECTED   Barbiturates NONE DETECTED NONE DETECTED  Pregnancy, urine  Result Value Ref  Range   Preg Test, Ur POSITIVE (A) NEGATIVE  hCG, quantitative, pregnancy  Result Value Ref Range   hCG, Beta Chain, Quant, S 75,671 (H) <5 mIU/mL    Procedures Procedures (including critical care time)  Medications Ordered in ED Medications - No data to display   Initial Impression / Assessment and Plan / ED Course  I have reviewed the triage vital signs and the nursing notes.  Pertinent labs that were available during my care of the patient were reviewed by me and considered in my medical decision making (see chart for details).  Clinical Course    Patient presents with hallucinations and suicidal thoughts. Old records are reviewed confirming recent stay at psychiatric holding and was a long hospital with discharge on July 24. She does express suicidal thoughts tonight, so consultation will be obtained with TTS.Screening labs are significant only for mild hypokalemia and she's given a dose of K-Dur.  Final Clinical Impressions(s) / ED Diagnoses   Final diagnoses:  Suicidal ideation  Auditory hallucination  Hypokalemia    New Prescriptions New Prescriptions   No medications on file     Delora Fuel, MD [id number]

## 2015-08-30 NOTE — ED Provider Notes (Signed)
Patient has requested to be discharged and I was asked to evaluate by nursing staff. Patient states that she is not suicidal. She reports that she had heard a voice yesterday but that has not recurred since then. She reports that the voices had told her to kill her self but she does not feel suicidal. She states that she is pregnant and she has no intention of hurting herself or her baby. She reports she came to here voluntarily because she is currently having family stressors that include the father of her child currently being in a psychiatric facility. She also was in a difficult position trying to get her checks cashed to pay bills. She reports that her mother-in-law is reliable and helpful to her. She wants to go stay with her and she will assist her in cashing her checks and paying bills until her boyfriend is back.   Patient speech is slightly pressured but is all situationally oriented. There is no tangential pattern that does not correlate with her plans around getting to her mother-in-law's house in managing her personal affairs. Patient states she is not suicidal and she describes occasionally hearing voices but even at that time not believing she would act on it. At this time, I do not find there is cause to place the patient in IVC. She is not acutely psychotic and she does not have any plan for self injury.  Physical exam the patient is alert and oriented. She is ambulatory without difficulty. Lungs are clear and heart is regular. Abdomen soft and nontender. She does not have any peripheral edema. She is neurologically intact.   Charlesetta Shanks, MD 08/30/15 [phone]

## 2015-08-30 NOTE — ED Notes (Signed)
Pt side lying/resting e/u resp. NAD at this time. Will awaken patient when meal tray comes.

## 2015-08-30 NOTE — ED Notes (Signed)
Pt wanded by security. 

## 2015-08-30 NOTE — ED Notes (Signed)
Verbal report given to Levada Dy, RN

## 2015-08-30 NOTE — ED Notes (Signed)
Breakfast tray no sharps ordered

## 2015-08-30 NOTE — ED Notes (Signed)
Pt having crying outburst then ate food very quickly. Then went to bathroom and vomited salad particles. Pt does not respond when given opportunity to speak offered something for nausea.  Pt then states "I want to go the fuck home." Pt speaking with fast pressured words. States she is upset because her husband has had psychotic break and is in an undisclosed  Psych facility. Dr. Wilhemena Durie called and informed of same.

## 2015-08-30 NOTE — ED Notes (Addendum)
Pt yelling, wants to be discharged now or will start swinging. Security at bedside.

## 2015-08-30 NOTE — ED Notes (Signed)
Pt making threats to hospital staff. States she wants her stuff back and to get the fuck out of here. Dr. Johnney Killian and security at bedside.

## 2015-09-01 ENCOUNTER — Encounter (HOSPITAL_COMMUNITY): Payer: Self-pay | Admitting: *Deleted

## 2015-09-01 ENCOUNTER — Emergency Department (HOSPITAL_COMMUNITY)
Admission: EM | Admit: 2015-09-01 | Discharge: 2015-09-03 | Disposition: A | Discharge status: Home / Self Care | Payer: Medicare Other | Attending: Emergency Medicine | Admitting: Emergency Medicine

## 2015-09-01 DIAGNOSIS — J45909 Unspecified asthma, uncomplicated: Secondary | ICD-10-CM | POA: Insufficient documentation

## 2015-09-01 DIAGNOSIS — Z79899 Other long term (current) drug therapy: Secondary | ICD-10-CM | POA: Insufficient documentation

## 2015-09-01 DIAGNOSIS — Z3A12 12 weeks gestation of pregnancy: Secondary | ICD-10-CM | POA: Insufficient documentation

## 2015-09-01 DIAGNOSIS — R45851 Suicidal ideations: Secondary | ICD-10-CM | POA: Insufficient documentation

## 2015-09-01 DIAGNOSIS — Z87891 Personal history of nicotine dependence: Secondary | ICD-10-CM | POA: Diagnosis not present

## 2015-09-01 DIAGNOSIS — F1490 Cocaine use, unspecified, uncomplicated: Secondary | ICD-10-CM | POA: Diagnosis not present

## 2015-09-01 DIAGNOSIS — F4310 Post-traumatic stress disorder, unspecified: Secondary | ICD-10-CM

## 2015-09-01 DIAGNOSIS — Z046 Encounter for general psychiatric examination, requested by authority: Secondary | ICD-10-CM | POA: Diagnosis present

## 2015-09-01 DIAGNOSIS — F259 Schizoaffective disorder, unspecified: Secondary | ICD-10-CM | POA: Diagnosis not present

## 2015-09-01 DIAGNOSIS — F250 Schizoaffective disorder, bipolar type: Secondary | ICD-10-CM | POA: Diagnosis not present

## 2015-09-01 DIAGNOSIS — F603 Borderline personality disorder: Secondary | ICD-10-CM

## 2015-09-01 DIAGNOSIS — O99345 Other mental disorders complicating the puerperium: Secondary | ICD-10-CM | POA: Diagnosis not present

## 2015-09-01 LAB — CBC
HEMATOCRIT: 38.9 % (ref 36.0–46.0)
HEMOGLOBIN: 12.9 g/dL (ref 12.0–15.0)
MCH: 28.5 pg (ref 26.0–34.0)
MCHC: 33.2 g/dL (ref 30.0–36.0)
MCV: 85.9 fL (ref 78.0–100.0)
Platelets: 238 10*3/uL (ref 150–400)
RBC: 4.53 MIL/uL (ref 3.87–5.11)
RDW: 13 % (ref 11.5–15.5)
WBC: 10.8 10*3/uL — AB (ref 4.0–10.5)

## 2015-09-01 LAB — COMPREHENSIVE METABOLIC PANEL WITH GFR
ALT: 21 U/L (ref 14–54)
AST: 20 U/L (ref 15–41)
Albumin: 4.4 g/dL (ref 3.5–5.0)
Alkaline Phosphatase: 42 U/L (ref 38–126)
Anion gap: 10 (ref 5–15)
BUN: 6 mg/dL (ref 6–20)
CO2: 20 mmol/L — ABNORMAL LOW (ref 22–32)
Calcium: 9.3 mg/dL (ref 8.9–10.3)
Chloride: 106 mmol/L (ref 101–111)
Creatinine, Ser: 0.51 mg/dL (ref 0.44–1.00)
GFR calc Af Amer: >60 mL/min
GFR calc non Af Amer: >60 mL/min
Glucose, Bld: 83 mg/dL (ref 65–99)
Potassium: 3.5 mmol/L (ref 3.5–5.1)
Sodium: 136 mmol/L (ref 135–145)
Total Bilirubin: 0.7 mg/dL (ref 0.3–1.2)
Total Protein: 7.9 g/dL (ref 6.5–8.1)

## 2015-09-01 LAB — RAPID URINE DRUG SCREEN, HOSP PERFORMED
Amphetamines: NOT DETECTED
Barbiturates: NOT DETECTED
Benzodiazepines: NOT DETECTED
Cocaine: NOT DETECTED
Opiates: NOT DETECTED
Tetrahydrocannabinol: NOT DETECTED

## 2015-09-01 LAB — URINE MICROSCOPIC-ADD ON

## 2015-09-01 LAB — URINALYSIS, ROUTINE W REFLEX MICROSCOPIC
Bilirubin Urine: NEGATIVE
Glucose, UA: NEGATIVE mg/dL
Ketones, ur: >80 mg/dL — AB
Nitrite: NEGATIVE
Protein, ur: 30 mg/dL — AB
Specific Gravity, Urine: 1.029 (ref 1.005–1.030)
pH: 5.5 (ref 5.0–8.0)

## 2015-09-01 LAB — PREGNANCY, URINE: Preg Test, Ur: POSITIVE — AB

## 2015-09-01 LAB — ETHANOL: Alcohol, Ethyl (B): <5 mg/dL

## 2015-09-01 MED ORDER — ACETAMINOPHEN 325 MG PO TABS
650.0000 mg | ORAL_TABLET | Freq: Once | ORAL | Status: AC
  Administered 2015-09-01: 650 mg via ORAL
  Filled 2015-09-01: qty 2

## 2015-09-01 NOTE — ED Notes (Signed)
Bed: [facility]PT4 Expected date:  Expected time:  Means of arrival:  Comments: EMS-psych patient

## 2015-09-01 NOTE — ED Triage Notes (Signed)
Per BIB EMS, pt states would like to admitted inpatient to a psychiatric hospital for 7-10 days due to stressors at home. Pt was seen at [facility] on 7/25 for similar symptoms. Pt states she hears voices telling her to hurt herself, states she does not want to hurt herself due to being [redacted] weeks pregnant.

## 2015-09-01 NOTE — Progress Notes (Signed)
Patient noted to have had 16 ED visits with 2 admissions within the last six months.   Patient listed with BCBS and Medicaid [place] access insurance. Pcp listed on patient's Medicaid card is located at [facility]. Patient does not qualify for [facility] Patient does not qualify for medication assistance through [facility] due to insurance status. Per chart review, patient's ACT Team Case Manager from [facility] is [name] [phone] Patient's legal guardian [name] from the [facility].

## 2015-09-01 NOTE — ED Provider Notes (Signed)
[facility] DEPT Provider Note   CSN: [id number] Arrival date & time: 09/01/15  1920   By signing my name below, I, [name] Fudge, attest that this documentation has been prepared under the direction and in the presence of [employer], PA-C. Electronically Signed: [name] Fudge, ED Scribe. 09/01/15. 8:43 PM.  First Provider Contact:  8:31 PM     History   Chief Complaint Chief Complaint  Patient presents with  . Psychiatric Evaluation  . Suicidal    The history is provided by the patient. No language interpreter was used.    HPI Comments: [name] is a 25 y.o. female who presents to the Emergency Department complaining of hearing voices instructing her to hurt herself. Pt was seen on 08/30/15 for similar symptoms. Pt states her husband walked out 9 days ago and that's when she "lost it". Pt states the social worker wanted to place her in a group home, but she doesn't feel she needs a group home. She states "I need 7-10 days in psychiatric hospital, not a group home". Pt states she is hearing voices to tell her to kill herself. Pt states she has planned to use Tylenol to overdose. Pt states she has hx of OD "8-9 times". Pt states she has been to [facility] and other hospitals multiples times before for attempted overdoses. Pt states she wanted to go home the last time she was hospitalized because she was "forced into a corner". Pt states she is currently pregnant; no reported vaginal c/o or bleeding.   Past Medical History:  Diagnosis Date  . Acid reflux   . Asthma   . Autism   . 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 Chronic constipation   . Depression   . Drug-seeking behavior   . Essential tremor   . Personality disorder   . Schizo-affective psychosis ([place])   . Schizoaffective disorder, bipolar type ([place])   . Seizures [facility])     Patient Active Problem List   Diagnosis Date Noted  . Rh negative, antepartum 08/10/2015  . Supervision of high risk pregnancy in first trimester  08/08/2015  . Seizure disorder ([place]) 08/08/2015  . Seizure disorder in pregnancy, antepartum ([place]) 08/08/2015  . Chronic migraine 07/27/2015  . Vitamin D deficiency 07/18/2015  . Pregnancy 07/10/2015  . Asthma 04/15/2015  . Convulsion ([place]) 04/12/2015  . Schizoaffective disorder, bipolar type by history(HCC) 03/10/2014  . Borderline personality disorder 03/10/2014  . PTSD (post-traumatic stress disorder) 03/10/2014    Past Surgical History:  Procedure Laterality Date  . MOUTH SURGERY      OB History    Gravida Para Term Preterm AB Living   2       1     SAB TAB Ectopic Multiple Live Births   1               Home Medications    Prior to Admission medications   Medication Sig Start Date End Date Taking? Authorizing Provider  acetaminophen (TYLENOL) 500 MG tablet Take 500 mg by mouth every 6 (six) hours as needed for mild pain.   Yes Historical Provider, MD  albuterol (PROVENTIL HFA;VENTOLIN HFA) 108 (90 Base) MCG/ACT inhaler Inhale 1-2 puffs into the lungs every 6 (six) hours as needed for wheezing or shortness of breath. Reported on 05/05/2015 07/20/15  Yes Wendie Agreste, MD  calcium carbonate (TUMS - DOSED IN MG ELEMENTAL CALCIUM) 500 MG chewable tablet Chew 1 tablet by mouth daily.   Yes Historical Provider,  MD  cholecalciferol (VITAMIN D-400) 400 UNITS TABS tablet Take 800 Units by mouth daily.    Yes Historical Provider, MD  docusate sodium (COLACE) 100 MG capsule Take 200 mg by mouth daily.    Yes Historical Provider, MD  doxylamine, Sleep, (UNISOM) 25 MG tablet Take 25 mg by mouth at bedtime as needed for sleep.   Yes Historical Provider, MD  fluticasone-salmeterol (ADVAIR HFA) 115-21 MCG/ACT inhaler Inhale 2 puffs into the lungs 2 (two) times daily. 08/03/15  Yes Timmothy Euler, MD  levETIRAcetam (KEPPRA) 500 MG tablet Take 1 tablet (500 mg total) by mouth 2 (two) times daily. 07/27/15  Yes Britt Bottom, MD  Menthol, Topical Analgesic, (BENGAY EX) Apply 1 application  topically daily as needed (pain).    Yes Historical Provider, MD  PRENATAL 27-1 MG TABS TAKE 1 TABLET BY MOUTH AT BEDTIME 08/16/15  Yes Ugonna A Anyanwu, MD  Pyridoxine HCl (B-6) 50 MG TABS Take 100 mg by mouth 2 (two) times daily.    Yes Historical Provider, MD  senna (SENOKOT) 8.6 MG tablet Take 2 tablets by mouth daily.   Yes Historical Provider, MD    Family History Family History  Problem Relation Age of Onset  . Mental illness Father   . Asthma Father   . PDD Brother   . Seizures Brother     Social History Social History  Substance Use Topics  . Smoking status: Former Smoker    Packs/day: 0.00  . Smokeless tobacco: Never Used     Comment: Smoked for 2  years age [age]  . Alcohol use 0.6 oz/week    1 Standard drinks or equivalent per week     Comment: "not with preg"     Allergies   Bee venom; Coconut flavor; Geodon [ziprasidone hcl]; Haloperidol and related; Lithium; Oxycodone; Seroquel [quetiapine fumarate]; Percocet [oxycodone-acetaminophen]; Prilosec [omeprazole]; Shellfish allergy; Tegretol [carbamazepine]; and Phenergan [promethazine hcl]   Review of Systems Review of Systems  Constitutional: Negative for fever.  Psychiatric/Behavioral: Positive for hallucinations (Auditory) and suicidal ideas.  All other systems reviewed and are negative.    Physical Exam Updated Vital Signs BP 117/69 (BP Location: Left Arm)   Pulse 87   Temp 97 F (36.1 C) (Oral)   Resp 22   LMP 06/07/2015   SpO2 99%   Physical Exam  Constitutional: She is oriented to person, place, and time. She appears well-developed and well-nourished. No distress.  Nontoxic appearing, in no distress. Eating a sandwich.  HENT:  Head: Normocephalic and atraumatic.  Eyes: Conjunctivae and EOM are normal. No scleral icterus.  Neck: Normal range of motion.  Cardiovascular: Normal rate.   Pulmonary/Chest: Effort normal. No respiratory distress.  Respirations even and unlabored  Abdominal: She  exhibits no distension.  Musculoskeletal: Normal range of motion.  Neurological: She is alert and oriented to person, place, and time.  Skin: Skin is warm and dry. No rash noted. She is not diaphoretic. No erythema. No pallor.  Psychiatric: Her speech is rapid and/or pressured. She is agitated (mild). Cognition and memory are normal. She expresses suicidal ideation. She expresses suicidal plans (overdose, pills/tylenol).  Nursing note and vitals reviewed.    ED Treatments / Results  DIAGNOSTIC STUDIES: Oxygen Saturation is 98% on RA, normal by my interpretation.    COORDINATION OF CARE: 8:32 PM Discussed treatment plan with pt at bedside which includes TTS consult and pt agreed to plan.   Labs (all labs ordered are listed, but only abnormal results are  displayed) Labs Reviewed  COMPREHENSIVE METABOLIC PANEL - Abnormal; Notable for the following:       Result Value   CO2 20 (*)    All other components within normal limits  CBC - Abnormal; Notable for the following:    WBC 10.8 (*)    All other components within normal limits  PREGNANCY, URINE - Abnormal; Notable for the following:    Preg Test, Ur POSITIVE (*)    All other components within normal limits  URINALYSIS, ROUTINE W REFLEX MICROSCOPIC (NOT AT [facility]) - Abnormal; Notable for the following:    APPearance TURBID (*)    Hgb urine dipstick LARGE (*)    Ketones, ur >80 (*)    Protein, ur 30 (*)    Leukocytes, UA TRACE (*)    All other components within normal limits  URINE MICROSCOPIC-ADD ON - Abnormal; Notable for the following:    Squamous Epithelial / LPF TOO NUMEROUS TO COUNT (*)    Bacteria, UA MANY (*)    All other components within normal limits  ETHANOL  URINE RAPID DRUG SCREEN, HOSP PERFORMED    EKG  EKG Interpretation None       Radiology No results found.  Procedures Procedures (including critical care time)  Medications Ordered in ED Medications  acetaminophen (TYLENOL) tablet 500 mg (not  administered)  docusate sodium (COLACE) capsule 200 mg (not administered)  levETIRAcetam (KEPPRA) tablet 500 mg (500 mg Oral Given 09/02/15 0058)  multivitamin-prenatal tablet 1 tablet (1 tablet Oral Given 09/02/15 0058)  doxylamine (Sleep) (UNISOM) tablet 25 mg (not administered)  senna (SENOKOT) tablet 17.2 mg (17.2 mg Oral Given 09/02/15 0058)  pyridOXINE (VITAMIN B-6) tablet 100 mg (100 mg Oral Given 09/02/15 0058)  calcium carbonate (TUMS - dosed in mg elemental calcium) chewable tablet 200 mg of elemental calcium (not administered)  mometasone-formoterol (DULERA) 200-5 MCG/ACT inhaler 2 puff (not administered)  acetaminophen (TYLENOL) tablet 650 mg (650 mg Oral Given 09/01/15 2200)     Initial Impression / Assessment and Plan / ED Course  I have reviewed the triage vital signs and the nursing notes.  Pertinent labs & imaging results that were available during my care of the patient were reviewed by me and considered in my medical decision making (see chart for details).  Clinical Course    Patient medically cleared. She has been recommended for inpatient psychiatric care. No beds available at [facility]. TTS seeking placement. Disposition to be determined by oncoming ED provider.   Final Clinical Impressions(s) / ED Diagnoses   Final diagnoses:  Schizoaffective disorder, unspecified type ([place])    New Prescriptions New Prescriptions   No medications on file   I personally performed the services described in this documentation, which was scribed in my presence. The recorded information has been reviewed and is accurate.       Antonietta Breach, PA-C 09/02/15 [id number]    Charlesetta Shanks, MD 09/08/15 [phone]

## 2015-09-01 NOTE — BH Assessment (Addendum)
Tele Assessment Note   [name] is an 25 y.o. married female who presents voluntarily to [facility] ED via EMS due to depression, anxiety, suicidal ideation and auditory command hallucinations. Pt describes being in local ED's seeking treatment and says she left [name] yesterday before being evaluated by TTS. Pt says she contacted her ACTT team today because she feels she needs to be in a psychiatric facility. She said ACTT discussed possible group home placement but Pt says that she doesn't want to lose her independence. Pt states she is four months pregnant and therefore is off her usual psychiatric medications. She says she feels depressed, anxious and paranoid. She says her thoughts are racing and she cannot concentrate. She says she is not eating and ate today for the first time in three days.  She says she is sleeping because she is taking Unisom. She reports suicidal ideation with no intent but adds "if I was going to do it I would use pills." Pt says she has attempted suicide 8-9 times by overdose and was admitted to [facility] in June 2016 following overdose. Pt reports command auditory hallucinations telling her to kill herself, bang her head against a wall and also derogatory comments. Pt denies visual hallucinations. Pt denies current homicidal ideation or history of violence. Pt reports she used crack when she was [age] but denies any alcohol or substance use since then.  Pt identifies her primary stressor as her husband leaving her. Pt says she fears he is not coming back and worries she will have to raise her child by herself. She says her step-cousin's baby is critically ill and dying, which is also upsetting her. Pt says "I'm pushed to my limit!" Pt states she has tried working with mobile crisis and her ACTT team but "I know what I need and I need to be in a hospital." Pt reports her legal guardian is [name] with [place] [phone]  Pt is dressed in hospital  scrubs, alert, oriented x4 with pressured speech and restless motor behavior. Eye contact is fair. Pt's mood is depressed and anxious; affect anxious. Thought process is coherent and relevant. Pt was cooperative throughout assessment.  Received call from Woodroe Mode, RN with [facility] [phone] (Do NOT give Pt this personal telephone number). She said Pt is not psychiatrically stabile at this time, is unsafe to be on her own and needs inpatient psychiatric treatment.    Diagnosis: Schizoaffective Disorder  Past Medical History:  Past Medical History:  Diagnosis Date  . Acid reflux   . Asthma   . Autism   . 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 Chronic constipation   . Depression   . Drug-seeking behavior   . Essential tremor   . Personality disorder   . Schizo-affective psychosis ([place])   . Schizoaffective disorder, bipolar type ([place])   . Seizures ([place])     Past Surgical History:  Procedure Laterality Date  . MOUTH SURGERY      Family History:  Family History  Problem Relation Age of Onset  . Mental illness Father   . Asthma Father   . PDD Brother   . Seizures Brother     Social History:  reports that she has quit smoking. She smoked 0.00 packs per day. She has never used smokeless tobacco. She reports that she drinks about 0.6 oz of alcohol per week . She reports that she does not use drugs.  Additional Social  History:  Alcohol / Drug Use Pain Medications: None Prescriptions: See MAR Over the Counter: See MAR History of alcohol / drug use?: Yes (Pt reports she used cocaine at age 38) Longest period of sobriety (when/how long): Six years  CIWA: CIWA-Ar BP: 116/68 Pulse Rate: 94 COWS:    PATIENT STRENGTHS: (choose at least two) Ability for insight Average or above average intelligence Communication skills General fund of knowledge Motivation for treatment/growth Physical Health  Allergies:  Allergies  Allergen Reactions  . Bee Venom Anaphylaxis   . Coconut Flavor Anaphylaxis  . Geodon [Ziprasidone Hcl] Other (See Comments)    Paralysis of mouth  . Haloperidol And Related Other (See Comments)    Paralysis of mouth   . Lithium Other (See Comments)    Seizure like muscle spasms   . Oxycodone Other (See Comments)    hallucinations   . Seroquel [Quetiapine Fumarate] Other (See Comments)    TOO STRONG  . Percocet [Oxycodone-Acetaminophen] Other (See Comments)    Causes hallucinations.  . Prilosec [Omeprazole] Nausea And Vomiting  . Shellfish Allergy Swelling  . Tegretol [Carbamazepine] Nausea And Vomiting  . Phenergan [Promethazine Hcl] Other (See Comments)    Chest pain      Home Medications:  (Not in a hospital admission)  OB/GYN Status:  Patient's last menstrual period was 06/07/2015.  General Assessment Data [facility] of Assessment: [facility] TTS Assessment: In system Is this a Tele or Face-to-Face Assessment?: Tele Assessment Is this an Initial Assessment or a Re-assessment for this encounter?: Initial Assessment Marital status: Married [place] name: NA Is patient pregnant?: Yes Pregnancy Status: Yes (Comment: include estimated delivery date) (Pt reports she is four months pregnany) Living Arrangements: Spouse/significant other Can pt return to current living arrangement?: Yes Admission Status: Voluntary Is patient capable of signing voluntary admission?: Yes Referral Source: Self/Family/Friend Insurance type: Medicare and Medicaid     Crisis Care Plan Living Arrangements: Spouse/significant other Legal Guardian: Other: [name] with [place] [phone]) Name of Psychiatrist: Beverly Sessions Name of Therapist: [facility]  Education Status Is patient currently in school?: No Current Grade: NA Highest grade of school patient has completed: 12 Name of school: NA Contact person: NA  Risk to self with the past 6 months Suicidal Ideation: Yes-Currently Present Has patient been a risk to self within the past 6  months prior to admission? : Yes Suicidal Intent: No Has patient had any suicidal intent within the past 6 months prior to admission? : Yes Is patient at risk for suicide?: Yes Suicidal Plan?: Yes-Currently Present Has patient had any suicidal plan within the past 6 months prior to admission? : Yes Specify Current Suicidal Plan: Overdose on medications Access to Means: Yes Specify Access to Suicidal Means: Pt has access to Tylenol at home What has been your use of drugs/alcohol within the last 12 months?: Pt reports she used cocaine at age 66, denies recent use Previous Attempts/Gestures: Yes How many times?: 9 Other Self Harm Risks: Command hallucinations to kill herself Triggers for Past Attempts: Spouse contact, Hallucinations Intentional Self Injurious Behavior: None Family Suicide History: No Recent stressful life event(s): Conflict (Comment), Other (Comment) (Pregnant, husband left) Persecutory voices/beliefs?: Yes Depression: Yes Depression Symptoms: Despondent, Tearfulness, Feeling angry/irritable, Loss of interest in usual pleasures Substance abuse history and/or treatment for substance abuse?: Yes Suicide prevention information given to non-admitted patients: Not applicable  Risk to Others within the past 6 months Homicidal Ideation: No Does patient have any lifetime risk of violence toward others beyond  the six months prior to admission? : No Thoughts of Harm to Others: No Current Homicidal Intent: No Current Homicidal Plan: No Access to Homicidal Means: No Identified Victim: None History of harm to others?: No Assessment of Violence: None Noted Violent Behavior Description: Pt denies history of violence Does patient have access to weapons?: No Criminal Charges Pending?: No Does patient have a court date: No Is patient on probation?: No  Psychosis Hallucinations: Auditory, With command (Command voices to kill or harm herself) Delusions: None noted  Mental Status  Report Appearance/Hygiene: In scrubs Eye Contact: Fair Motor Activity: Restlessness Speech: Pressured Level of Consciousness: Alert Mood: Anxious, Depressed Affect: Anxious Anxiety Level: Moderate Thought Processes: Coherent, Relevant Judgement: Partial Orientation: Person, Place, Time, Situation, Appropriate for developmental age Obsessive Compulsive Thoughts/Behaviors: None  Cognitive Functioning Concentration: Decreased Memory: Recent Intact, Remote Intact IQ: Average Insight: Fair Impulse Control: Poor Appetite: Poor Weight Loss: 0 Weight Gain: 0 Sleep: Decreased Total Hours of Sleep: 6 (Using Unisom to sleep) Vegetative Symptoms: None  ADLScreening [facility] Assessment Services) Patient's cognitive ability adequate to safely complete daily activities?: Yes Patient able to express need for assistance with ADLs?: Yes Independently performs ADLs?: Yes (appropriate for developmental age)  Prior Inpatient Therapy Prior Inpatient Therapy: Yes Prior Therapy Dates: 2016 Prior Therapy Facilty/Provider(s): [facility], [facility], [facility] Reason for Treatment: Schizoaffective disorder  Prior Outpatient Therapy Prior Outpatient Therapy: Yes Prior Therapy Dates: Current Prior Therapy Facilty/Provider(s): [facility] Reason for Treatment: Schizoaffective disorder Does patient have an ACCT team?: Yes ([facility]) Does patient have Intensive In-House Services?  : No Does patient have [facility] services? : Yes Does patient have P4CC services?: No  ADL Screening (condition at time of admission) Patient's cognitive ability adequate to safely complete daily activities?: Yes Is the patient deaf or have difficulty hearing?: No Does the patient have difficulty seeing, even when wearing glasses/contacts?: No Does the patient have difficulty concentrating, remembering, or making decisions?: No Patient able to express need for assistance with ADLs?: Yes Does the patient have difficulty  dressing or bathing?: No Independently performs ADLs?: Yes (appropriate for developmental age) Does the patient have difficulty walking or climbing stairs?: No Weakness of Legs: None Weakness of Arms/Hands: None  Home Assistive Devices/Equipment Home Assistive Devices/Equipment: Eyeglasses    Abuse/Neglect Assessment (Assessment to be complete while patient is alone) Physical Abuse: Denies Verbal Abuse: Denies Sexual Abuse: Denies Exploitation of patient/patient's resources: Denies Self-Neglect: Denies     Regulatory affairs officer (For Healthcare) Does patient have an advance directive?: No Would patient like information on creating an advanced directive?: No - patient declined information    Additional Information 1:1 In Past 12 Months?: Yes CIRT Risk: No Elopement Risk: No Does patient have medical clearance?: Yes     Disposition: Gave clinical report to Serena Colonel, NP who said Pt meets criteria for inpatient psychiatric treatment but is not clinically appropriate for [facility] due to pregnancy. TTS will contact other facilities for placement. Notified [facility] staff of recommendation.  Disposition Initial Assessment Completed for this Encounter: Yes Disposition of Patient: Inpatient treatment program Type of inpatient treatment program: Adult   Evelena Peat, [facility], [facility], [facility] Triage Specialist [phone]   Evelena Peat 09/01/2015 9:36 PM

## 2015-09-02 DIAGNOSIS — F250 Schizoaffective disorder, bipolar type: Secondary | ICD-10-CM

## 2015-09-02 DIAGNOSIS — R45851 Suicidal ideations: Secondary | ICD-10-CM | POA: Diagnosis not present

## 2015-09-02 DIAGNOSIS — O99345 Other mental disorders complicating the puerperium: Secondary | ICD-10-CM | POA: Diagnosis not present

## 2015-09-02 MED ORDER — DIPHENHYDRAMINE HCL 25 MG PO CAPS
25.0000 mg | ORAL_CAPSULE | Freq: Two times a day (BID) | ORAL | Status: DC
  Administered 2015-09-02 – 2015-09-03 (×3): 25 mg via ORAL
  Filled 2015-09-02 (×3): qty 1

## 2015-09-02 MED ORDER — OLANZAPINE 10 MG PO TBDP
10.0000 mg | ORAL_TABLET | Freq: Three times a day (TID) | ORAL | Status: DC | PRN
  Administered 2015-09-02: 10 mg via ORAL
  Filled 2015-09-02: qty 1

## 2015-09-02 MED ORDER — MOMETASONE FURO-FORMOTEROL FUM 200-5 MCG/ACT IN AERO
2.0000 | INHALATION_SPRAY | Freq: Two times a day (BID) | RESPIRATORY_TRACT | Status: DC
  Administered 2015-09-02 – 2015-09-03 (×3): 2 via RESPIRATORY_TRACT
  Filled 2015-09-02: qty 8.8

## 2015-09-02 MED ORDER — CALCIUM CARBONATE ANTACID 500 MG PO CHEW
1.0000 | CHEWABLE_TABLET | Freq: Every day | ORAL | Status: DC
  Filled 2015-09-02 (×2): qty 1

## 2015-09-02 MED ORDER — LEVETIRACETAM 500 MG PO TABS
500.0000 mg | ORAL_TABLET | Freq: Two times a day (BID) | ORAL | Status: DC
  Administered 2015-09-02 – 2015-09-03 (×4): 500 mg via ORAL
  Filled 2015-09-02 (×4): qty 1

## 2015-09-02 MED ORDER — DOCUSATE SODIUM 100 MG PO CAPS
200.0000 mg | ORAL_CAPSULE | Freq: Every day | ORAL | Status: DC
  Administered 2015-09-02 – 2015-09-03 (×2): 200 mg via ORAL
  Filled 2015-09-02 (×3): qty 2

## 2015-09-02 MED ORDER — ACETAMINOPHEN 500 MG PO TABS
500.0000 mg | ORAL_TABLET | Freq: Four times a day (QID) | ORAL | Status: DC | PRN
  Administered 2015-09-02: 500 mg via ORAL
  Filled 2015-09-02: qty 1

## 2015-09-02 MED ORDER — PRENATAL 27-0.8 MG PO TABS
1.0000 | ORAL_TABLET | Freq: Every day | ORAL | Status: DC
Start: 2015-09-02 — End: 2015-09-03
  Administered 2015-09-02 (×2): 1 via ORAL
  Filled 2015-09-02 (×2): qty 1

## 2015-09-02 MED ORDER — MOMETASONE FURO-FORMOTEROL FUM 200-5 MCG/ACT IN AERO: 2.0000 | INHALATION_SPRAY | Freq: Two times a day (BID) | RESPIRATORY_TRACT | Status: DC

## 2015-09-02 MED ORDER — VITAMIN B-6 50 MG PO TABS
100.0000 mg | ORAL_TABLET | Freq: Two times a day (BID) | ORAL | Status: DC
  Administered 2015-09-02 – 2015-09-03 (×4): 100 mg via ORAL
  Filled 2015-09-02 (×4): qty 2

## 2015-09-02 MED ORDER — DOXYLAMINE SUCCINATE (SLEEP) 25 MG PO TABS
25.0000 mg | ORAL_TABLET | Freq: Every evening | ORAL | Status: DC | PRN
  Filled 2015-09-02: qty 1

## 2015-09-02 MED ORDER — OLANZAPINE 5 MG PO TBDP
5.0000 mg | ORAL_TABLET | Freq: Two times a day (BID) | ORAL | Status: DC
  Administered 2015-09-02 – 2015-09-03 (×2): 5 mg via ORAL
  Filled 2015-09-02 (×2): qty 1

## 2015-09-02 MED ORDER — SENNA 8.6 MG PO TABS
2.0000 | ORAL_TABLET | Freq: Every day | ORAL | Status: DC
  Administered 2015-09-02 (×2): 17.2 mg via ORAL
  Filled 2015-09-02 (×2): qty 2

## 2015-09-02 NOTE — BH Assessment (Addendum)
[place] Assessment Progress Note  [name], pt's guardian from the [facility] of [place], calls to ask about pt's status.  I report that pt requires psychiatric hospitalization at this time.  He asks if [facility] is among the facilities to which pt is being referred.  I inform him that we are considering [facility], but that if pt is diagnosed with IDD, this will complicate the process and that we will need psychometric testing to establish pt's IQ.  He agrees to look into this, and will fax results to [phone] if he finds them.  His phone number is [phone].  He adds that he will be turning guardianship of this pt over to another [employer] [facility] employee, [name] [phone]) in the near future.  Evelyn Mullet, MA Triage Specialist [phone]   Addendum:  Mr [name] has faxed [facility] document for this pt, following up with a call.  He reports that documentation supports diagnosis of Asperger's Disorder, but not IDD.  [name], [place] Triage Specialist [phone]
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
## 2015-09-02 NOTE — Progress Notes (Signed)
CSW spoke with Margarita Grizzle of [facility]. She states that at this time their physician is seeking more information from pt's ACT team.   She states to call back Monday morning for an update if the patient is still present in  [facility].  Willette Brace [id number] ED CSW 7/28/20176:20 PM

## 2015-09-02 NOTE — ED Notes (Signed)
Patient noted sleeping in room. No complaints, stable, in no acute distress. Q15 minute rounds and monitoring via [employer] to continue.  

## 2015-09-02 NOTE — ED Notes (Signed)
Pt states she needs her "10 mg of pyrexa and benedryl" to help with her "racing thoughts."  Pt states she has been trying to get medicine for this for "12 hours."  Pt was informed that the provider is with another pt at this minute and that I will inform provider of her request when provider is available.

## 2015-09-02 NOTE — ED Notes (Signed)
Patient continues to scratch arm.  When trying to redirect patient to not hurt herself patient replied,"  I just dont care anymore."

## 2015-09-02 NOTE — BH Assessment (Signed)
[place] Assessment Progress Note  Per [name], MD, this pt requires psychiatic hospitalization at this time;  The following facilities have been contacted to seek placement for this pt, with results as noted:  Beds available, information sent, decision pending:  [facility]   At capacity:  [place], [place] Triage Specialist [phone]

## 2015-09-02 NOTE — ED Notes (Signed)
Report from [place]. Patient sleeping, respirations regular and unlabored. Q15 minute rounds and security camera observation to continue.

## 2015-09-02 NOTE — ED Notes (Signed)
Sitter alerted  Nurse to patient scratching her arm very hard saying she was going to make it bleed.  Called ED physician and then in house psychiatriatrist.  Psychiatrist to order medication for patient.  Patient and sitter updated.

## 2015-09-02 NOTE — Progress Notes (Signed)
CSW spoke with Noela of [facility]. She states that she will run this patient with her physician in order to be reviewed.  Willette Brace [id number] ED CSW 7/28/20176:16 PM

## 2015-09-02 NOTE — Progress Notes (Signed)
Noela of [facility] reached out to CSW. She states that facility is declining patient due to her having a seizure in June.  Willette Brace [id number] ED CSW 7/28/20176:22 PM

## 2015-09-02 NOTE — Consult Note (Signed)
[facility] Consult   Reason for Consult:  Overdose  Referring Physician:  EDP Patient Identification: SCHERRIE SENECA MRN:  [id number] Principal Diagnosis: Schizoaffective disorder, bipolar type [facility]) Diagnosis:   Patient Active Problem List   Diagnosis Date Noted  . Schizoaffective disorder, bipolar type by history(HCC) [F25.0] 03/10/2014    Priority: High  . Rh negative, antepartum [O36.0190] 08/10/2015  . Supervision of high risk pregnancy in first trimester [O09.91] 08/08/2015  . Seizure disorder ([place]) [I50.277] 08/08/2015  . Seizure disorder in pregnancy, antepartum ([place]) [O99.350, G40.909] 08/08/2015  . Chronic migraine [G43.709] 07/27/2015  . Vitamin D deficiency [E55.9] 07/18/2015  . Pregnancy [Z33.1] 07/10/2015  . Asthma [J45.909] 04/15/2015  . Convulsion ([place]) [R56.9] 04/12/2015  . Borderline personality disorder [F60.3] 03/10/2014  . PTSD (post-traumatic stress disorder) [F43.10] 03/10/2014    Total Time spent with patient: 45 minutes  Subjective:   [name] is a 25 y.o. female patient admitted with suicide attempt.  HPI:  On admission:  25 y.o. married female who presents voluntarily to [facility] ED via EMS due to depression, anxiety, suicidal ideation and auditory command hallucinations. Pt describes being in local ED's seeking treatment and says she left [name] yesterday before being evaluated by TTS. Pt says she contacted her ACTT team today because she feels she needs to be in a psychiatric facility. She said ACTT discussed possible group home placement but Pt says that she doesn't want to lose her independence. Pt states she is four months pregnant and therefore is off her usual psychiatric medications. She says she feels depressed, anxious and paranoid. She says her thoughts are racing and she cannot concentrate. She says she is not eating and ate today for the first time in three days.  She says she is sleeping because she is taking  Unisom. She reports suicidal ideation with no intent but adds "if I was going to do it I would use pills." Pt says she has attempted suicide 8-9 times by overdose and was admitted to [facility] in June 2016 following overdose. Pt reports command auditory hallucinations telling her to kill herself, bang her head against a wall and also derogatory comments. Pt denies visual hallucinations. Pt denies current homicidal ideation or history of violence. Pt reports she used crack when she was [age] but denies any alcohol or substance use since then.  Pt identifies her primary stressor as her husband leaving her. Pt says she fears he is not coming back and worries she will have to raise her child by herself. She says her step-cousin's baby is critically ill and dying, which is also upsetting her. Pt says "I'm pushed to my limit!" Pt states she has tried working with mobile crisis and her ACTT team but "I know what I need and I need to be in a hospital." Pt reports her legal guardian is [name] with [place] [phone]  Pt is dressed in hospital scrubs, alert, oriented x4 with pressured speech and restless motor behavior. Eye contact is fair. Pt's mood is depressed and anxious; affect anxious. Thought process is coherent and relevant. Pt was cooperative throughout assessment.  Received call from Woodroe Mode, RN with [facility] [phone] (Do NOT give Pt this personal telephone number). She said Pt is not psychiatrically stabile at this time, is unsafe to be on her own and needs inpatient psychiatric treatment.   Today:  Patient is irritable and depressed on assessment.  She became upset when told she  might [facility] the state [facility] and later tried to cut her arms.  Patient is high on the Cluster B traits.  ACT team and guardian involved.   Past Psychiatric History: schizoaffective disorder  Risk to Self: Suicidal Ideation: Yes-Currently Present Suicidal Intent: No Is patient at risk  for suicide?: Yes Suicidal Plan?: Yes-Currently Present Specify Current Suicidal Plan: Overdose on medications Access to Means: Yes Specify Access to Suicidal Means: Pt has access to Tylenol at home What has been your use of drugs/alcohol within the last 12 months?: Pt reports she used cocaine at age 72, denies recent use How many times?: 9 Other Self Harm Risks: Command hallucinations to kill herself Triggers for Past Attempts: Spouse contact, Hallucinations Intentional Self Injurious Behavior: None Risk to Others: Homicidal Ideation: No Thoughts of Harm to Others: No Current Homicidal Intent: No Current Homicidal Plan: No Access to Homicidal Means: No Identified Victim: None History of harm to others?: No Assessment of Violence: None Noted Violent Behavior Description: Pt denies history of violence Does patient have access to weapons?: No Criminal Charges Pending?: No Does patient have a court date: No Prior Inpatient Therapy: Prior Inpatient Therapy: Yes Prior Therapy Dates: 2016 Prior Therapy Facilty/Provider(s): [place], [facility], [facility] Reason for Treatment: Schizoaffective disorder Prior Outpatient Therapy: Prior Outpatient Therapy: Yes Prior Therapy Dates: Current Prior Therapy Facilty/Provider(s): [facility] Reason for Treatment: Schizoaffective disorder Does patient have an ACCT team?: Yes ([facility]) Does patient have Intensive In-House Services?  : No Does patient have [facility] services? : Yes Does patient have P4CC services?: No  Past Medical History:  Past Medical History:  Diagnosis Date  . Acid reflux   . Asthma   . Autism   . 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 Chronic constipation   . Depression   . Drug-seeking behavior   . Essential tremor   . Personality disorder   . Schizo-affective psychosis ([place])   . Schizoaffective disorder, bipolar type ([place])   . Seizures ([place])     Past Surgical History:  Procedure Laterality Date  . MOUTH SURGERY      Family History:  Family History  Problem Relation Age of Onset  . Mental illness Father   . Asthma Father   . PDD Brother   . Seizures Brother    Family Psychiatric  History: none Social History:  History  Alcohol Use  . 0.6 oz/week  . 1 Standard drinks or equivalent per week    Comment: "not with preg"     History  Drug Use No    Comment: History of cocaine use at age 26 for 4 months    Social History   Social History  . Marital status: Married    Spouse name: N/A  . Number of children: 0  . Years of education: N/A   Occupational History  . disability    Social History Main Topics  . Smoking status: Former Smoker    Packs/day: 0.00  . Smokeless tobacco: Never Used     Comment: Smoked for 2  years age [age]  . Alcohol use 0.6 oz/week    1 Standard drinks or equivalent per week     Comment: "not with preg"  . Drug use: No     Comment: History of cocaine use at age 53 for 4 months  . Sexual activity: Yes    Birth control/ protection: None   Other Topics Concern  . None   Social History Narrative   Marital  status: married; guardian is Furniture conservator/restorer      Children: none      Lives: with husband in apartment      Employment:  Disability      Tobacco: quit smoking; smoked for two years.      Alcohol ;none      Drugs: none           Additional Social History:    Allergies:   Allergies  Allergen Reactions  . Bee Venom Anaphylaxis  . Coconut Flavor Anaphylaxis  . Geodon [Ziprasidone Hcl] Other (See Comments)    Paralysis of mouth  . Haloperidol And Related Other (See Comments)    Paralysis of mouth   . Lithium Other (See Comments)    Seizure like muscle spasms   . Oxycodone Other (See Comments)    hallucinations   . Seroquel [Quetiapine Fumarate] Other (See Comments)    TOO STRONG  . Percocet [Oxycodone-Acetaminophen] Other (See Comments)    Causes hallucinations.  . Prilosec [Omeprazole] Nausea And Vomiting  . Shellfish Allergy Swelling  .  Tegretol [Carbamazepine] Nausea And Vomiting  . Phenergan [Promethazine Hcl] Other (See Comments)    Chest pain      Labs:  Results for orders placed or performed during the hospital encounter of 09/01/15 (from the past 48 hour(s))  Rapid urine drug screen (hospital performed)     Status: None   Collection Time: 09/01/15  8:12 PM  Result Value Ref Range   Opiates NONE DETECTED NONE DETECTED   Cocaine NONE DETECTED NONE DETECTED   Benzodiazepines NONE DETECTED NONE DETECTED   Amphetamines NONE DETECTED NONE DETECTED   Tetrahydrocannabinol NONE DETECTED NONE DETECTED   Barbiturates NONE DETECTED NONE DETECTED    Comment:        DRUG SCREEN FOR MEDICAL PURPOSES ONLY.  IF CONFIRMATION IS NEEDED FOR ANY PURPOSE, NOTIFY LAB WITHIN 5 DAYS.        LOWEST DETECTABLE LIMITS FOR URINE DRUG SCREEN Drug Class       Cutoff (ng/mL) Amphetamine      1000 Barbiturate      200 Benzodiazepine   329 Tricyclics       191 Opiates          300 Cocaine          300 THC              50   Pregnancy, urine     Status: Abnormal   Collection Time: 09/01/15  8:18 PM  Result Value Ref Range   Preg Test, Ur POSITIVE (A) NEGATIVE    Comment:        THE SENSITIVITY OF THIS METHODOLOGY IS >20 mIU/mL.   Urinalysis, Routine w reflex microscopic (not at [facility])     Status: Abnormal   Collection Time: 09/01/15  8:18 PM  Result Value Ref Range   Color, Urine YELLOW YELLOW   APPearance TURBID (A) CLEAR   Specific Gravity, Urine 1.029 1.005 - 1.030   pH 5.5 5.0 - 8.0   Glucose, UA NEGATIVE NEGATIVE mg/dL   Hgb urine dipstick LARGE (A) NEGATIVE   Bilirubin Urine NEGATIVE NEGATIVE   Ketones, ur >80 (A) NEGATIVE mg/dL   Protein, ur 30 (A) NEGATIVE mg/dL   Nitrite NEGATIVE NEGATIVE   Leukocytes, UA TRACE (A) NEGATIVE  Urine microscopic-add on     Status: Abnormal   Collection Time: 09/01/15  8:18 PM  Result Value Ref Range   Squamous Epithelial / LPF TOO NUMEROUS TO COUNT (  A) NONE SEEN   WBC, UA 0-5 0 -  5 WBC/hpf   RBC / HPF 0-5 0 - 5 RBC/hpf   Bacteria, UA MANY (A) NONE SEEN   Urine-Other MICROSCOPIC EXAM PERFORMED ON UNCONCENTRATED URINE     Comment: URINALYSIS PERFORMED ON SUPERNATANT LESS THAN 10 mL OF URINE SUBMITTED   Comprehensive metabolic panel     Status: Abnormal   Collection Time: 09/01/15  8:34 PM  Result Value Ref Range   Sodium 136 135 - 145 mmol/L   Potassium 3.5 3.5 - 5.1 mmol/L   Chloride 106 101 - 111 mmol/L   CO2 20 (L) 22 - 32 mmol/L   Glucose, Bld 83 65 - 99 mg/dL   BUN 6 6 - 20 mg/dL   Creatinine, Ser 0.51 0.44 - 1.00 mg/dL   Calcium 9.3 8.9 - 10.3 mg/dL   Total Protein 7.9 6.5 - 8.1 g/dL   Albumin 4.4 3.5 - 5.0 g/dL   AST 20 15 - 41 U/L   ALT 21 14 - 54 U/L   Alkaline Phosphatase 42 38 - 126 U/L   Total Bilirubin 0.7 0.3 - 1.2 mg/dL   GFR calc non Af Amer >60 >60 mL/min   GFR calc Af Amer >60 >60 mL/min    Comment: (NOTE) The eGFR has been calculated using the CKD EPI equation. This calculation has not been validated in all clinical situations. eGFR's persistently <60 mL/min signify possible Chronic Kidney Disease.    Anion gap 10 5 - 15  Ethanol     Status: None   Collection Time: 09/01/15  8:34 PM  Result Value Ref Range   Alcohol, Ethyl (B) <5 <5 mg/dL    Comment:        LOWEST DETECTABLE LIMIT FOR SERUM ALCOHOL IS 5 mg/dL FOR MEDICAL PURPOSES ONLY   cbc     Status: Abnormal   Collection Time: 09/01/15  8:34 PM  Result Value Ref Range   WBC 10.8 (H) 4.0 - 10.5 K/uL   RBC 4.53 3.87 - 5.11 MIL/uL   Hemoglobin 12.9 12.0 - 15.0 g/dL   HCT 38.9 36.0 - 46.0 %   MCV 85.9 78.0 - 100.0 fL   MCH 28.5 26.0 - 34.0 pg   MCHC 33.2 30.0 - 36.0 g/dL   RDW 13.0 11.5 - 15.5 %   Platelets 238 150 - 400 K/uL    Current Facility-Administered Medications  Medication Dose Route Frequency Provider Last Rate Last Dose  . acetaminophen (TYLENOL) tablet 500 mg  500 mg Oral Q6H PRN Antonietta Breach, PA-C   500 mg at 09/02/15 4235  . calcium carbonate (TUMS -  dosed in mg elemental calcium) chewable tablet 200 mg of elemental calcium  1 tablet Oral Daily Antonietta Breach, PA-C      . diphenhydrAMINE (BENADRYL) capsule 25 mg  25 mg Oral BID PC Yurianna Tusing, MD   25 mg at 09/02/15 1107  . docusate sodium (COLACE) capsule 200 mg  200 mg Oral Daily Antonietta Breach, PA-C   200 mg at 09/02/15 1012  . doxylamine (Sleep) (UNISOM) tablet 25 mg  25 mg Oral QHS PRN Antonietta Breach, PA-C      . levETIRAcetam (KEPPRA) tablet 500 mg  500 mg Oral BID Antonietta Breach, PA-C   500 mg at 09/02/15 1020  . mometasone-formoterol (DULERA) 200-5 MCG/ACT inhaler 2 puff  2 puff Inhalation BID Antonietta Breach, PA-C   2 puff at 09/02/15 0817  . multivitamin-prenatal tablet 1 tablet  1 tablet  Oral QHS Antonietta Breach, PA-C   1 tablet at 09/02/15 0058  . OLANZapine zydis (ZYPREXA) disintegrating tablet 10 mg  10 mg Oral Q8H PRN [name], MD   10 mg at 09/02/15 1104  . OLANZapine zydis (ZYPREXA) disintegrating tablet 5 mg  5 mg Oral BID PC Chemere Steffler, MD      . pyridOXINE (VITAMIN B-6) tablet 100 mg  100 mg Oral BID Antonietta Breach, PA-C   100 mg at 09/02/15 1020  . senna (SENOKOT) tablet 17.2 mg  2 tablet Oral Daily Antonietta Breach, PA-C   17.2 mg at 09/02/15 1937   Current Outpatient Prescriptions  Medication Sig Dispense Refill  . acetaminophen (TYLENOL) 500 MG tablet Take 500 mg by mouth every 6 (six) hours as needed for mild pain.    Marland Kitchen albuterol (PROVENTIL HFA;VENTOLIN HFA) 108 (90 Base) MCG/ACT inhaler Inhale 1-2 puffs into the lungs every 6 (six) hours as needed for wheezing or shortness of breath. Reported on 05/05/2015 1 Inhaler 1  . calcium carbonate (TUMS - DOSED IN MG ELEMENTAL CALCIUM) 500 MG chewable tablet Chew 1 tablet by mouth daily.    . cholecalciferol (VITAMIN D-400) 400 UNITS TABS tablet Take 800 Units by mouth daily.     Marland Kitchen docusate sodium (COLACE) 100 MG capsule Take 200 mg by mouth daily.     Marland Kitchen doxylamine, Sleep, (UNISOM) 25 MG tablet Take 25 mg by mouth at bedtime as needed for  sleep.    . fluticasone-salmeterol (ADVAIR HFA) 115-21 MCG/ACT inhaler Inhale 2 puffs into the lungs 2 (two) times daily. 1 Inhaler 5  . levETIRAcetam (KEPPRA) 500 MG tablet Take 1 tablet (500 mg total) by mouth 2 (two) times daily. 60 tablet 5  . Menthol, Topical Analgesic, (BENGAY EX) Apply 1 application topically daily as needed (pain).     Marland Kitchen PRENATAL 27-1 MG TABS TAKE 1 TABLET BY MOUTH AT BEDTIME 30 tablet 12  . Pyridoxine HCl (B-6) 50 MG TABS Take 100 mg by mouth 2 (two) times daily.     Marland Kitchen senna (SENOKOT) 8.6 MG tablet Take 2 tablets by mouth daily.      Musculoskeletal: Strength & Muscle Tone: within normal limits Gait & Station: normal Patient leans: N/A  Psychiatric Specialty Exam: Physical Exam  Constitutional: She is oriented to person, place, and time. She appears well-developed and well-nourished.  HENT:  Head: Normocephalic.  Neck: Normal range of motion.  Respiratory: Effort normal.  Musculoskeletal: Normal range of motion.  Neurological: She is alert and oriented to person, place, and time.  Skin: Skin is warm and dry.  Psychiatric: Her speech is normal and behavior is normal. Her mood appears anxious. Her affect is blunt and labile. Cognition and memory are normal. She expresses impulsivity. She exhibits a depressed mood. She expresses suicidal ideation. She expresses suicidal plans.    Review of Systems  Constitutional: Negative.   HENT: Negative.   Eyes: Negative.   Respiratory: Negative.   Cardiovascular: Negative.   Gastrointestinal: Negative.   Genitourinary: Negative.   Musculoskeletal: Negative.   Skin: Negative.   Neurological: Negative.   Endo/Heme/Allergies: Negative.   Psychiatric/Behavioral: Positive for depression and suicidal ideas. The patient is nervous/anxious.     Blood pressure 114/69, pulse 84, temperature 98.3 F (36.8 C), temperature source Oral, resp. rate 20, last menstrual period 06/07/2015, SpO2 99 %.There is no height or weight on  file to calculate BMI.  General Appearance: Disheveled  Eye Contact:  Minimal  Speech:  Normal Rate  Volume:  Decreased  Mood:  Anxious, Depressed and Irritable  Affect:  Blunt  Thought Process:  Coherent and Descriptions of Associations: Intact  Orientation:  Full (Time, Place, and Person)  Thought Content:  Rumination  Suicidal Thoughts:  Yes.  with intent/plan  Homicidal Thoughts:  No  Memory:  Immediate;   Fair Recent;   Fair Remote;   Fair  Judgement:  Impaired  Insight:  Lacking  Psychomotor Activity:  Decreased  Concentration:  Concentration: Fair and Attention Span: Fair  Recall:  [employer] of Knowledge:  Fair  Language:  Good  Akathisia:  No  Handed:  Right  AIMS (if indicated):     Assets:  Housing Leisure Time Physical Health Resilience Social Support  ADL's:  Intact  Cognition:  Impaired,  Mild  Sleep:        Treatment Plan Summary: Daily contact with patient to assess and evaluate symptoms and progress in treatment, Medication management and Plan schizoaffective disorder, bipolar type:  -Crisis stabilization -Medication management:  Continue current medical medications.  Started Zyprexa 5 mg BID for mood stabilization and Zyprexa 10 mg TID PRN agitation. -Individual counseling  Disposition: Recommend psychiatric Inpatient admission when medically cleared.  Waylan Boga, NP 09/02/2015 1:39 PM  Patient seen face-to-face for psychiatric evaluation, chart reviewed and case discussed with the physician extender and developed treatment plan. Reviewed the information documented and agree with the treatment plan. [name], MD

## 2015-09-02 NOTE — ED Notes (Signed)
Pt arrived to room 34.  She does not wish to speak to anyone as she just wants to sleep.  15 minute checks and video monitoring in place.

## 2015-09-02 NOTE — ED Notes (Signed)
Snack and beverage given. 

## 2015-09-03 DIAGNOSIS — R45851 Suicidal ideations: Secondary | ICD-10-CM | POA: Diagnosis not present

## 2015-09-03 DIAGNOSIS — F250 Schizoaffective disorder, bipolar type: Secondary | ICD-10-CM | POA: Diagnosis not present

## 2015-09-03 DIAGNOSIS — O99345 Other mental disorders complicating the puerperium: Secondary | ICD-10-CM | POA: Diagnosis not present

## 2015-09-03 LAB — URINE CULTURE

## 2015-09-03 MED ORDER — OLANZAPINE 5 MG PO TBDP: 5.0000 mg | ORAL_TABLET | Freq: Two times a day (BID) | ORAL | 0 refills | Status: DC

## 2015-09-03 NOTE — ED Notes (Signed)
Up on the phone 

## 2015-09-03 NOTE — ED Notes (Signed)
Up to the desk reports that she came in because of the voices and that her ACT team could not get her zyprexa until Tuesday.  Pt reports that she also has genetic testing scheduled for 8/1.

## 2015-09-03 NOTE — Discharge Instructions (Signed)
Schizoaffective Disorder  Schizoaffective disorder (ScAD) is a mental illness. It causes symptoms that are a mixture of schizophrenia (a psychotic disorder) and an affective (mood) disorder. The schizophrenic symptoms may include delusions, hallucinations, or odd behavior. The mood symptoms may be similar to major depression or bipolar disorder. ScAD may interfere with personal relationships or normal daily activities. People with ScAD are at increased risk for job loss, social isolation, physical health problems, anxiety and substance use disorders, and suicide.  ScAD usually occurs in cycles. Periods of severe symptoms are followed by periods of  less severe symptoms or improvement. The illness affects men and women equally but usually appears at an earlier age (teenage or early adult years) in men. People who have family members with schizophrenia, bipolar disorder, or ScAD are at higher risk of developing ScAD.  SYMPTOMS   At any one time, people with ScAD may have psychotic symptoms only or both psychotic and mood symptoms. The psychotic symptoms include one or more of the following:  · Hearing, seeing, or feeling things that are not there (hallucinations).    · Having fixed, false beliefs (delusions). The delusions usually are of being attacked, harassed, cheated, persecuted, or conspired against (paranoid delusions).  · Speaking in a way that makes no sense to others (disorganized speech).  The psychotic symptoms of ScAD may also include confusing or odd behavior or any of the negative symptoms of schizophrenia. These include loss of motivation for normal daily activities, such as bathing or grooming, withdrawal from other people, and lack of emotions.     The mood symptoms of ScAD occur more often than not. They resemble major depressive disorder or bipolar mania. Symptoms of major depression include depressed mood and four or more of the following:  · Loss of interest in usually pleasurable activities  (anhedonia).  · Sleeping more or less than normal.  · Feeling worthless or excessively guilty.  · Lack of energy or motivation.  · Trouble concentrating.  · Eating more or less than usual.  · Thinking a lot about death or suicide.  Symptoms of bipolar mania include abnormally elevated or irritable mood and increased energy or activity, plus three or more of the following:    · More confidence than normal or feeling that you are able to do anything (grandiosity).  · Feeling rested with less sleep than normal.    · Being easily distracted.    · Talking more than usual or feeling pressured to keep talking.    · Feeling that your thoughts are racing.  · Engaging in high-risk activities such as buying sprees or foolish business decisions.  DIAGNOSIS   ScAD is diagnosed through an assessment by your health care provider. Your health care provider will observe and ask questions about your thoughts, behavior, mood, and ability to function in daily life. Your health care provider may also ask questions about your medical history and use of drugs, including prescription medicines. Your health care provider may also order blood tests and imaging exams. Certain medical conditions and substances can cause symptoms that resemble ScAD. Your health care provider may refer you to a mental health specialist for evaluation.   ScAD is divided into two types. The depressive type is diagnosed if your mood symptoms are limited to major depression. The bipolar type is diagnosed if your mood symptoms are manic or a mixture of manic and depressive symptoms  TREATMENT   ScAD is usually a lifelong illness. Long-term treatment is necessary. The following treatments are available:  · Medicine. Different types of   medicine are used to treat ScAD. The exact combination depends on the type and severity of your symptoms. Antipsychotic medicine is used to control psychotic symptoms such as delusions, paranoia, and hallucinations. Mood stabilizers can  even the highs and lows of bipolar manic mood swings. Antidepressant medicines are used to treat major depressive symptoms.  · Counseling or talk therapy. Individual, group, or family counseling may be helpful in providing education, support, and guidance. Many people with ScAD also benefit from social skills and job skills (vocational) training.  A combination of medicine and counseling is usually best for managing the disorder over time. A procedure in which electricity is applied to the brain through the scalp (electroconvulsive therapy) may be used to treat people with severe manic symptoms that do not respond to medicine and counseling.  HOME CARE INSTRUCTIONS   · Take all your medicine as prescribed.  · Check with your health care provider before starting new prescription or over-the-counter medicines.  · Keep all follow up appointments with your health care provider.  SEEK MEDICAL CARE IF:   · If you are not able to take your medicines as prescribed.  · If your symptoms get worse.  SEEK IMMEDIATE MEDICAL CARE IF:   · You have serious thoughts about hurting yourself or others.     This information is not intended to replace advice given to you by your health care provider. Make sure you discuss any questions you have with your health care provider.     Document Released: 06/04/2006 Document Revised: 02/12/2014 Document Reviewed: 09/05/2012  Elsevier Interactive Patient Education ©2016 [employer]

## 2015-09-03 NOTE — ED Notes (Signed)
Patient noted sleeping in room. No complaints, stable, in no acute distress. Q15 minute rounds and monitoring via [employer] to continue.  

## 2015-09-03 NOTE — ED Notes (Signed)
On the phone, husband to pick pt up

## 2015-09-03 NOTE — ED Notes (Addendum)
Evelyn Clock DNP into see  Pt agrees to let her husband lock up all medications that are at home and follow up with [facility] and her ACT team. Pt denies si/hi/avh.

## 2015-09-03 NOTE — [facility] Suicide Risk Assessment (Signed)
Suicide Risk Assessment  Discharge Assessment   [facility] Discharge Suicide Risk Assessment   Principal Problem: Schizoaffective disorder, bipolar type [facility]) Discharge Diagnoses:  Patient Active Problem List   Diagnosis Date Noted  . Schizoaffective disorder, bipolar type by history(HCC) [F25.0] 03/10/2014    Priority: High  . Rh negative, antepartum [O36.0190] 08/10/2015  . Supervision of high risk pregnancy in first trimester [O09.91] 08/08/2015  . Seizure disorder ([place]) [id number] 08/08/2015  . Seizure disorder in pregnancy, antepartum ([place]) [O99.350, G40.909] 08/08/2015  . Chronic migraine [G43.709] 07/27/2015  . Vitamin D deficiency [E55.9] 07/18/2015  . Pregnancy [Z33.1] 07/10/2015  . Asthma [J45.909] 04/15/2015  . Convulsion ([place]) [R56.9] 04/12/2015  . Borderline personality disorder [F60.3] 03/10/2014  . PTSD (post-traumatic stress disorder) [F43.10] 03/10/2014    Total Time spent with patient: 45 minutes  Musculoskeletal: Strength & Muscle Tone: within normal limits Gait & Station: normal Patient leans: N/A  Psychiatric Specialty Exam: Physical Exam  Constitutional: She is oriented to person, place, and time. She appears well-developed and well-nourished.  HENT:  Head: Normocephalic.  Neck: Normal range of motion.  Respiratory: Effort normal.  Musculoskeletal: Normal range of motion.  Neurological: She is alert and oriented to person, place, and time.  Skin: Skin is warm and dry.  Psychiatric: Her speech is normal and behavior is normal. Thought content normal. Her mood appears anxious. Her affect is labile. Cognition and memory are normal. She expresses impulsivity.    Review of Systems  Constitutional: Negative.   HENT: Negative.   Eyes: Negative.   Respiratory: Negative.   Cardiovascular: Negative.   Gastrointestinal: Negative.   Genitourinary: Negative.   Musculoskeletal: Negative.   Skin: Negative.   Neurological: Negative.   Endo/Heme/Allergies: Negative.    Psychiatric/Behavioral: The patient is nervous/anxious.     Blood pressure (!) 100/53, pulse 70, temperature 98 F (36.7 C), temperature source Oral, resp. rate 16, last menstrual period 06/07/2015, SpO2 99 %.There is no height or weight on file to calculate BMI.  General Appearance: Casual  Eye Contact:  Good  Speech:  Normal Rate  Volume:  Normal  Mood:  Anxious  Affect:  Congruent  Thought Process:  Coherent and Descriptions of Associations: Intact  Orientation:  Full (Time, Place, and Person)  Thought Content:  WDL  Suicidal Thoughts:  No  Homicidal Thoughts:  No  Memory:  Immediate;   Good Recent;   Good Remote;   Good  Judgement:  Fair  Insight:  Fair  Psychomotor Activity:  Normal  Concentration:  Concentration: Good and Attention Span: Good  Recall:  Good  Fund of Knowledge:  Good  Language:  Good  Akathisia:  No  Handed:  Right  AIMS (if indicated):     Assets:  Housing Intimacy Leisure Time Physical Health Resilience Social Support  ADL's:  Intact  Cognition:  WNL  Sleep:       Mental Status Per Nursing Assessment::   On Admission:   overdose, intentional  Demographic Factors:  Adolescent or young adult and Caucasian  Loss Factors: NA  Historical Factors: Prior suicide attempts and Impulsivity  Risk Reduction Factors:   Pregnancy, Sense of responsibility to family, Living with another person, especially a relative, Positive social support and Positive therapeutic relationship  Continued Clinical Symptoms:  Anxiety, mild  Cognitive Features That Contribute To Risk:  None    Suicide Risk:  Minimal: No identifiable suicidal ideation.  Patients presenting with no risk factors but with morbid ruminations; may be classified as minimal risk based  on the severity of the depressive symptoms    Plan Of Care/Follow-up recommendations:  Activity:  as tolerated Diet:  heart healthy diet  LORD, JAMISON, NP 09/03/2015, 10:55 AM

## 2015-09-03 NOTE — Consult Note (Signed)
[facility] Consult   Reason for Consult:  Intentional overdose  Referring Physician:  EDP Patient Identification: [name] MRN:  [id number] Principal Diagnosis: Schizoaffective disorder, bipolar type [facility]) Diagnosis:   Patient Active Problem List   Diagnosis Date Noted  . Schizoaffective disorder, bipolar type by history(HCC) [F25.0] 03/10/2014    Priority: High  . Rh negative, antepartum [O36.0190] 08/10/2015  . Supervision of high risk pregnancy in first trimester [O09.91] 08/08/2015  . Seizure disorder ([place]) [V25.366] 08/08/2015  . Seizure disorder in pregnancy, antepartum ([place]) [O99.350, G40.909] 08/08/2015  . Chronic migraine [G43.709] 07/27/2015  . Vitamin D deficiency [E55.9] 07/18/2015  . Pregnancy [Z33.1] 07/10/2015  . Asthma [J45.909] 04/15/2015  . Convulsion ([place]) [R56.9] 04/12/2015  . Borderline personality disorder [F60.3] 03/10/2014  . PTSD (post-traumatic stress disorder) [F43.10] 03/10/2014    Total Time spent with patient: 45 minutes  Subjective:   [name] is a 25 y.o. female patient does not warrant admission.  HPI:  On admission:  26 y.o. married female who presents voluntarily to [address] ED via EMS due to depression, anxiety, suicidal ideation and auditory command hallucinations. Pt describes being in local ED's seeking treatment and says she left [name] yesterday before being evaluated by TTS. Pt says she contacted her ACTT team today because she feels she needs to be in a psychiatric facility. She said ACTT discussed possible group home placement but Pt says that she doesn't want to lose her independence. Pt states she is four months pregnant and therefore is off her usual psychiatric medications. She says she feels depressed, anxious and paranoid. She says her thoughts are racing and she cannot concentrate. She says she is not eating and ate today for the first time in three days.  She says she is sleeping because she is  taking Unisom. She reports suicidal ideation with no intent but adds "if I was going to do it I would use pills." Pt says she has attempted suicide 8-9 times by overdose and was admitted to [facility] in June 2016 following overdose. Pt reports command auditory hallucinations telling her to kill herself, bang her head against a wall and also derogatory comments. Pt denies visual hallucinations. Pt denies current homicidal ideation or history of violence. Pt reports she used crack when she was [age] but denies any alcohol or substance use since then.  Pt identifies her primary stressor as her husband leaving her. Pt says she fears he is not coming back and worries she will have to raise her child by herself. She says her step-cousin's baby is critically ill and dying, which is also upsetting her. Pt says "I'm pushed to my limit!" Pt states she has tried working with mobile crisis and her ACTT team but "I know what I need and I need to be in a hospital." Pt reports her legal guardian is [name] with [facility] [phone]  Pt is dressed in hospital scrubs, alert, oriented x4 with pressured speech and restless motor behavior. Eye contact is fair. Pt's mood is depressed and anxious; affect anxious. Thought process is coherent and relevant. Pt was cooperative throughout assessment.  Received call from Woodroe Mode, RN with [facility] [phone] (Do NOT give Pt this personal telephone number). She said Pt is not psychiatrically stabile at this time, is unsafe to be on her own and needs inpatient psychiatric treatment.   Today:  The patient is adamant that she is not depressed or suicidal.  She  was upset because she and her husband were having issues but have resolved these now.  Denies suicidal/homicidal ideations, hallucinations, paranoia, and alcohol/drug abuse.  The husband, Norman Ditto, was contacted by this provider and he is willing to take her home and has no safety concerns for  [name].  He was instructed to call 911 or bring her back to ED if he has any concerns at this time.  [name] is agreeable to continue her medication after discharge.  ACT team is with [facility].  Past Psychiatric History: schizoaffective disorder  Risk to Self: Suicidal Ideation: Yes-Currently Present Suicidal Intent: No Is patient at risk for suicide?: Yes Suicidal Plan?: Yes-Currently Present Specify Current Suicidal Plan: Overdose on medications Access to Means: Yes Specify Access to Suicidal Means: Pt has access to Tylenol at home What has been your use of drugs/alcohol within the last 12 months?: Pt reports she used cocaine at age 18, denies recent use How many times?: 9 Other Self Harm Risks: Command hallucinations to kill herself Triggers for Past Attempts: Spouse contact, Hallucinations Intentional Self Injurious Behavior: None Risk to Others: Homicidal Ideation: No Thoughts of Harm to Others: No Current Homicidal Intent: No Current Homicidal Plan: No Access to Homicidal Means: No Identified Victim: None History of harm to others?: No Assessment of Violence: None Noted Violent Behavior Description: Pt denies history of violence Does patient have access to weapons?: No Criminal Charges Pending?: No Does patient have a court date: No Prior Inpatient Therapy: Prior Inpatient Therapy: Yes Prior Therapy Dates: 2016 Prior Therapy Facilty/Provider(s): [facility], [facility], [facility] Reason for Treatment: Schizoaffective disorder Prior Outpatient Therapy: Prior Outpatient Therapy: Yes Prior Therapy Dates: Current Prior Therapy Facilty/Provider(s): [facility] Reason for Treatment: Schizoaffective disorder Does patient have an ACCT team?: Yes ([facility]) Does patient have Intensive In-House Services?  : No Does patient have [facility] services? : Yes Does patient have P4CC services?: No  Past Medical History:  Past Medical History:  Diagnosis Date  . Acid reflux   . Asthma   .  Autism   . 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 Chronic constipation   . Depression   . Drug-seeking behavior   . Essential tremor   . Personality disorder   . Schizo-affective psychosis (HCC)   . Schizoaffective disorder, bipolar type (HCC)   . Seizures (HCC)     Past Surgical History:  Procedure Laterality Date  . MOUTH SURGERY     Family History:  Family History  Problem Relation Age of Onset  . Mental illness Father   . Asthma Father   . PDD Brother   . Seizures Brother    Family Psychiatric  History: none Social History:  History  Alcohol Use  . 0.6 oz/week  . 1 Standard drinks or equivalent per week    Comment: "not with preg"     History  Drug Use No    Comment: History of cocaine use at age 18 for 4 months    Social History   Social History  . Marital status: Married    Spouse name: N/A  . Number of children: 0  . Years of education: N/A   Occupational History  . disability    Social History Main Topics  . Smoking status: Former Smoker    Packs/day: 0.00  . Smokeless tobacco: Never Used     Comment: Smoked for 2  years age [age]  . Alcohol use 0.6 oz/week    1 Standard drinks or equivalent per   week     Comment: "not with preg"  . Drug use: No     Comment: History of cocaine use at age 18 for 4 months  . Sexual activity: Yes    Birth control/ protection: None   Other Topics Concern  . None   Social History Narrative   Marital status: married; guardian is Jonte Joe      Children: none      Lives: with husband in apartment      Employment:  Disability      Tobacco: quit smoking; smoked for two years.      Alcohol ;none      Drugs: none           Additional Social History:    Allergies:   Allergies  Allergen Reactions  . Bee Venom Anaphylaxis  . Coconut Flavor Anaphylaxis  . Geodon [Ziprasidone Hcl] Other (See Comments)    Paralysis of mouth  . Haloperidol And Related Other (See Comments)    Paralysis of mouth   . Lithium Other  (See Comments)    Seizure like muscle spasms   . Oxycodone Other (See Comments)    hallucinations   . Seroquel [Quetiapine Fumarate] Other (See Comments)    TOO STRONG  . Percocet [Oxycodone-Acetaminophen] Other (See Comments)    Causes hallucinations.  . Prilosec [Omeprazole] Nausea And Vomiting  . Shellfish Allergy Swelling  . Tegretol [Carbamazepine] Nausea And Vomiting  . Phenergan [Promethazine Hcl] Other (See Comments)    Chest pain      Labs:  Results for orders placed or performed during the hospital encounter of 09/01/15 (from the past 48 hour(s))  Rapid urine drug screen (hospital performed)     Status: None   Collection Time: 09/01/15  8:12 PM  Result Value Ref Range   Opiates NONE DETECTED NONE DETECTED   Cocaine NONE DETECTED NONE DETECTED   Benzodiazepines NONE DETECTED NONE DETECTED   Amphetamines NONE DETECTED NONE DETECTED   Tetrahydrocannabinol NONE DETECTED NONE DETECTED   Barbiturates NONE DETECTED NONE DETECTED    Comment:        DRUG SCREEN FOR MEDICAL PURPOSES ONLY.  IF CONFIRMATION IS NEEDED FOR ANY PURPOSE, NOTIFY LAB WITHIN 5 DAYS.        LOWEST DETECTABLE LIMITS FOR URINE DRUG SCREEN Drug Class       Cutoff (ng/mL) Amphetamine      1000 Barbiturate      200 Benzodiazepine   200 Tricyclics       300 Opiates          300 Cocaine          300 THC              50   Pregnancy, urine     Status: Abnormal   Collection Time: 09/01/15  8:18 PM  Result Value Ref Range   Preg Test, Ur POSITIVE (A) NEGATIVE    Comment:        THE SENSITIVITY OF THIS METHODOLOGY IS >20 mIU/mL.   Urinalysis, Routine w reflex microscopic (not at [facility])     Status: Abnormal   Collection Time: 09/01/15  8:18 PM  Result Value Ref Range   Color, Urine YELLOW YELLOW   APPearance TURBID (A) CLEAR   Specific Gravity, Urine 1.029 1.005 - 1.030   pH 5.5 5.0 - 8.0   Glucose, UA NEGATIVE NEGATIVE mg/dL   Hgb urine dipstick LARGE (A) NEGATIVE   Bilirubin Urine NEGATIVE  NEGATIVE   Ketones,   ur >80 (A) NEGATIVE mg/dL   Protein, ur 30 (A) NEGATIVE mg/dL   Nitrite NEGATIVE NEGATIVE   Leukocytes, UA TRACE (A) NEGATIVE  Urine microscopic-add on     Status: Abnormal   Collection Time: 09/01/15  8:18 PM  Result Value Ref Range   Squamous Epithelial / LPF TOO NUMEROUS TO COUNT (A) NONE SEEN   WBC, UA 0-5 0 - 5 WBC/hpf   RBC / HPF 0-5 0 - 5 RBC/hpf   Bacteria, UA MANY (A) NONE SEEN   Urine-Other MICROSCOPIC EXAM PERFORMED ON UNCONCENTRATED URINE     Comment: URINALYSIS PERFORMED ON SUPERNATANT LESS THAN 10 mL OF URINE SUBMITTED   Comprehensive metabolic panel     Status: Abnormal   Collection Time: 09/01/15  8:34 PM  Result Value Ref Range   Sodium 136 135 - 145 mmol/L   Potassium 3.5 3.5 - 5.1 mmol/L   Chloride 106 101 - 111 mmol/L   CO2 20 (L) 22 - 32 mmol/L   Glucose, Bld 83 65 - 99 mg/dL   BUN 6 6 - 20 mg/dL   Creatinine, Ser 0.51 0.44 - 1.00 mg/dL   Calcium 9.3 8.9 - 10.3 mg/dL   Total Protein 7.9 6.5 - 8.1 g/dL   Albumin 4.4 3.5 - 5.0 g/dL   AST 20 15 - 41 U/L   ALT 21 14 - 54 U/L   Alkaline Phosphatase 42 38 - 126 U/L   Total Bilirubin 0.7 0.3 - 1.2 mg/dL   GFR calc non Af Amer >60 >60 mL/min   GFR calc Af Amer >60 >60 mL/min    Comment: (NOTE) The eGFR has been calculated using the CKD EPI equation. This calculation has not been validated in all clinical situations. eGFR's persistently <60 mL/min signify possible Chronic Kidney Disease.    Anion gap 10 5 - 15  Ethanol     Status: None   Collection Time: 09/01/15  8:34 PM  Result Value Ref Range   Alcohol, Ethyl (B) <5 <5 mg/dL    Comment:        LOWEST DETECTABLE LIMIT FOR SERUM ALCOHOL IS 5 mg/dL FOR MEDICAL PURPOSES ONLY   cbc     Status: Abnormal   Collection Time: 09/01/15  8:34 PM  Result Value Ref Range   WBC 10.8 (H) 4.0 - 10.5 K/uL   RBC 4.53 3.87 - 5.11 MIL/uL   Hemoglobin 12.9 12.0 - 15.0 g/dL   HCT 38.9 36.0 - 46.0 %   MCV 85.9 78.0 - 100.0 fL   MCH 28.5 26.0 -  34.0 pg   MCHC 33.2 30.0 - 36.0 g/dL   RDW 13.0 11.5 - 15.5 %   Platelets 238 150 - 400 K/uL    Current Facility-Administered Medications  Medication Dose Route Frequency Provider Last Rate Last Dose  . acetaminophen (TYLENOL) tablet 500 mg  500 mg Oral Q6H PRN Antonietta Breach, PA-C   500 mg at 09/02/15 6734  . calcium carbonate (TUMS - dosed in mg elemental calcium) chewable tablet 200 mg of elemental calcium  1 tablet Oral Daily Antonietta Breach, PA-C      . diphenhydrAMINE (BENADRYL) capsule 25 mg  25 mg Oral BID PC Mojeed Akintayo, MD   25 mg at 09/03/15 0819  . docusate sodium (COLACE) capsule 200 mg  200 mg Oral Daily Antonietta Breach, PA-C   200 mg at 09/03/15 0944  . doxylamine (Sleep) (UNISOM) tablet 25 mg  25 mg Oral QHS PRN Antonietta Breach, PA-C      .  levETIRAcetam (KEPPRA) tablet 500 mg  500 mg Oral BID Kelly Humes, PA-C   500 mg at 09/03/15 0944  . mometasone-formoterol (DULERA) 200-5 MCG/ACT inhaler 2 puff  2 puff Inhalation BID Kelly Humes, PA-C   2 puff at 09/03/15 0818  . multivitamin-prenatal tablet 1 tablet  1 tablet Oral QHS Kelly Humes, PA-C   1 tablet at 09/02/15 2156  . OLANZapine zydis (ZYPREXA) disintegrating tablet 10 mg  10 mg Oral Q8H PRN Mojeed Akintayo, MD   10 mg at 09/02/15 1104  . OLANZapine zydis (ZYPREXA) disintegrating tablet 5 mg  5 mg Oral BID PC Mojeed Akintayo, MD   5 mg at 09/03/15 0819  . pyridOXINE (VITAMIN B-6) tablet 100 mg  100 mg Oral BID Kelly Humes, PA-C   100 mg at 09/03/15 0944  . senna (SENOKOT) tablet 17.2 mg  2 tablet Oral Daily Kelly Humes, PA-C   17.2 mg at 09/02/15 2156   Current Outpatient Prescriptions  Medication Sig Dispense Refill  . acetaminophen (TYLENOL) 500 MG tablet Take 500 mg by mouth every 6 (six) hours as needed for mild pain.    . albuterol (PROVENTIL HFA;VENTOLIN HFA) 108 (90 Base) MCG/ACT inhaler Inhale 1-2 puffs into the lungs every 6 (six) hours as needed for wheezing or shortness of breath. Reported on 05/05/2015 1 Inhaler 1  .  calcium carbonate (TUMS - DOSED IN MG ELEMENTAL CALCIUM) 500 MG chewable tablet Chew 1 tablet by mouth daily.    . cholecalciferol (VITAMIN D-400) 400 UNITS TABS tablet Take 800 Units by mouth daily.     . docusate sodium (COLACE) 100 MG capsule Take 200 mg by mouth daily.     . doxylamine, Sleep, (UNISOM) 25 MG tablet Take 25 mg by mouth at bedtime as needed for sleep.    . fluticasone-salmeterol (ADVAIR HFA) 115-21 MCG/ACT inhaler Inhale 2 puffs into the lungs 2 (two) times daily. 1 Inhaler 5  . levETIRAcetam (KEPPRA) 500 MG tablet Take 1 tablet (500 mg total) by mouth 2 (two) times daily. 60 tablet 5  . Menthol, Topical Analgesic, (BENGAY EX) Apply 1 application topically daily as needed (pain).     . PRENATAL 27-1 MG TABS TAKE 1 TABLET BY MOUTH AT BEDTIME 30 tablet 12  . Pyridoxine HCl (B-6) 50 MG TABS Take 100 mg by mouth 2 (two) times daily.     . senna (SENOKOT) 8.6 MG tablet Take 2 tablets by mouth daily.      Musculoskeletal: Strength & Muscle Tone: within normal limits Gait & Station: normal Patient leans: N/A  Psychiatric Specialty Exam: Physical Exam  Constitutional: She is oriented to person, place, and time. She appears well-developed and well-nourished.  HENT:  Head: Normocephalic.  Neck: Normal range of motion.  Respiratory: Effort normal.  Musculoskeletal: Normal range of motion.  Neurological: She is alert and oriented to person, place, and time.  Skin: Skin is warm and dry.  Psychiatric: Her speech is normal and behavior is normal. Thought content normal. Her mood appears anxious. Her affect is labile. Cognition and memory are normal. She expresses impulsivity.    Review of Systems  Constitutional: Negative.   HENT: Negative.   Eyes: Negative.   Respiratory: Negative.   Cardiovascular: Negative.   Gastrointestinal: Negative.   Genitourinary: Negative.   Musculoskeletal: Negative.   Skin: Negative.   Neurological: Negative.   Endo/Heme/Allergies: Negative.    Psychiatric/Behavioral: The patient is nervous/anxious.     Blood pressure (!) 100/53, pulse 70, temperature 98 F (36.7 C),   temperature source Oral, resp. rate 16, last menstrual period 06/07/2015, SpO2 99 %.There is no height or weight on file to calculate BMI.  General Appearance: Casual  Eye Contact:  Good  Speech:  Normal Rate  Volume:  Normal  Mood:  Anxious  Affect:  Congruent  Thought Process:  Coherent and Descriptions of Associations: Intact  Orientation:  Full (Time, Place, and Person)  Thought Content:  WDL  Suicidal Thoughts:  No  Homicidal Thoughts:  No  Memory:  Immediate;   Good Recent;   Good Remote;   Good  Judgement:  Fair  Insight:  Fair  Psychomotor Activity:  Normal  Concentration:  Concentration: Good and Attention Span: Good  Recall:  Good  Fund of Knowledge:  Good  Language:  Good  Akathisia:  No  Handed:  Right  AIMS (if indicated):     Assets:  Housing Intimacy Leisure Time Physical Health Resilience Social Support  ADL's:  Intact  Cognition:  WNL  Sleep:        Treatment Plan Summary: Daily contact with patient to assess and evaluate symptoms and progress in treatment, Medication management and Plan schizoaffective disorder, bipolar type:  -Crisis stabilization -Medication management:  Continue medical medications and Zyprexa 5 mg BID for mood and psychosis. -Individual counseling -Coordination with her husband regarding her care -Rx provided  Disposition: No evidence of imminent risk to self or others at present.    Waylan Boga, NP 09/03/2015 10:44 AM

## 2015-09-03 NOTE — ED Notes (Signed)
Dr Dwyane Dee and Nathanial Rancher DNP into see

## 2015-09-03 NOTE — ED Notes (Signed)
Up to the bathroom 

## 2015-09-03 NOTE — ED Notes (Addendum)
1635: Pt called and reported that her insurance would not fill her rx as written and that the pharmacy called the MD and it was changed to 10mg  at bedtime.  Pt questioning if she should start it tonight.  Jamison dnp contacted pt may start medication tonight VORB.  Pt informed.

## 2015-09-03 NOTE — ED Notes (Signed)
Ate approx 90% of breakfast

## 2015-09-03 NOTE — ED Notes (Addendum)
Written dc instructions reviewed with patient and husband (with pt's permission).  Pt encouraged to allow her husband to lock her medications up, to take her medications as directed, keep her OB appointments, and contact her her ACT team on Monday.  Pt encouraged to return/seek treatment for return of si/hi/avh.  Pt verbalized understanding  and agreed to do so,Pt's  husband Mariea Clonts) agreed to lock her medications up. Pt denies si/hi/avh on dc.  Pt ambulatory w/o difficulty to dc area with mHt, belongings returned after leaving the area.

## 2015-09-06 ENCOUNTER — Ambulatory Visit (HOSPITAL_COMMUNITY)
Admission: RE | Admit: 2015-09-06 | Discharge: 2015-09-06 | Disposition: A | Discharge status: Home / Self Care | Payer: Medicare Other | Source: Ambulatory Visit | Attending: Obstetrics and Gynecology | Admitting: Obstetrics and Gynecology

## 2015-09-06 ENCOUNTER — Encounter (HOSPITAL_COMMUNITY): Payer: Self-pay

## 2015-09-06 VITALS — BP 103/65 | HR 84 | Wt 165.0 lb

## 2015-09-06 DIAGNOSIS — F259 Schizoaffective disorder, unspecified: Secondary | ICD-10-CM

## 2015-09-06 DIAGNOSIS — F840 Autistic disorder: Secondary | ICD-10-CM

## 2015-09-06 DIAGNOSIS — Q992 Fragile X chromosome: Secondary | ICD-10-CM

## 2015-09-06 DIAGNOSIS — O99511 Diseases of the respiratory system complicating pregnancy, first trimester: Secondary | ICD-10-CM | POA: Diagnosis not present

## 2015-09-06 DIAGNOSIS — Z3A13 13 weeks gestation of pregnancy: Secondary | ICD-10-CM | POA: Diagnosis not present

## 2015-09-06 DIAGNOSIS — J4540 Moderate persistent asthma, uncomplicated: Secondary | ICD-10-CM | POA: Diagnosis not present

## 2015-09-06 DIAGNOSIS — O99351 Diseases of the nervous system complicating pregnancy, first trimester: Secondary | ICD-10-CM | POA: Insufficient documentation

## 2015-09-06 DIAGNOSIS — O99341 Other mental disorders complicating pregnancy, first trimester: Secondary | ICD-10-CM | POA: Diagnosis not present

## 2015-09-06 DIAGNOSIS — F603 Borderline personality disorder: Secondary | ICD-10-CM | POA: Diagnosis not present

## 2015-09-06 DIAGNOSIS — Z148 Genetic carrier of other disease: Secondary | ICD-10-CM

## 2015-09-06 DIAGNOSIS — G40909 Epilepsy, unspecified, not intractable, without status epilepticus: Secondary | ICD-10-CM | POA: Insufficient documentation

## 2015-09-06 DIAGNOSIS — O0991 Supervision of high risk pregnancy, unspecified, first trimester: Secondary | ICD-10-CM

## 2015-09-06 DIAGNOSIS — F209 Schizophrenia, unspecified: Secondary | ICD-10-CM | POA: Insufficient documentation

## 2015-09-06 DIAGNOSIS — O360190 Maternal care for anti-D [Rh] antibodies, unspecified trimester, not applicable or unspecified: Secondary | ICD-10-CM

## 2015-09-06 DIAGNOSIS — Z315 Encounter for genetic counseling: Secondary | ICD-10-CM | POA: Insufficient documentation

## 2015-09-06 NOTE — Progress Notes (Signed)
MFM Consult, staff note:  I. Schizoaffective d/o, borderline personality disorder: I discussed the association between uncontrolled psychiatric conditions during pregnancy and poor neonatal outcomes, such as low birth weight and increased risk of premature delivery. Therefore, I recommend she continue prescribed therapies and have close monitoring for signs of worsening life function.  ? Discontinuation of medication would place her at risk for major depression/pschosis.  I explained post partum depression is associated with decreased maternal attachment with the neonate, excessive crying in the neonate and the neonate can be more difficult to soothe. Therefore we recommend treating as is she reports off medications she experiences symptoms of deteriorating life function. ? She denies suicidal or homicidal ideation and has never tried to hurt herself or anyone else. She has a good support system in her psychiatrist and partner.  She agrees to continued medical therapy and psychiatry follow up. ? The benefit from continuing on a regimen that provides her stable psychiatric control may, in this case, outweigh the potential (unknown) theoretical risk to the fetus. She was advised as such. The limited data of fetal exposure to the aforementioned medications does not suggest a clinically significant increased risk of fetal anomalies or adverse pregnancy events. She was encouraged to continue this medication during pregnancy. She was encouraged to continue her prenatal care and psychiatric care with her providers.  II. Asthma, requiring albuterol MDI 1-2x/month indicates that patient has appropriate control on Advair BID. This patient has moderate, persistent asthma without current exacerbation.  Should she need her rescue inhaler 2 or more times in a week, this would indicate either an exacerbation needing medical attention or need for adjustment maintenance therapy (ie, increase dose of Advair).  III.   Seizure Disorder: The incidence of seizure disorders during pregnancy is 1-10 percent. Approximately 25 percent of women will have increased frequency of seizures during pregnancy while the remainder of women will have no change (50%) or decreased (25%) frequency of seizures.The increase seizure frequency is thought to be due to the increased stress during pregnancy as well as the physiologic changes of pregnancy. The metabolic changes during pregnancy often make it necessary to adjust the antiepileptic medications the woman is taking due to decreased absorption of the medication and increased renal clearance.   While all pregnancies have a 3-4 percent risk for some type of birth defect, the risk is increased to 6-9% in children of mothers who have a seizure disorder (especially for neural tube defects, heart defects and cleft lip/palate). Conversely, today's ultrasound demonstrates normal first trimester morphology.  She should have targeted survey in 5 weeks.  I explained the importance of adjusting medications used to prevent major seizures because it can be harmful for seizures to occur during pregnancy. In women with increased seizure frequency there is increased risk for stillbirth (2-8%), low birth weight (10%), preterm delivery (10%) and infants with developmental delay.   I recommend she see her neurologist regularly (every 2-3 months at a minimum) to help with adjustment of her medication if needed in setting of recurrent seizures or for recommendations for additional medication if appropriate.   Summary of Recommendations: 1. Continue antiepileptic prophylaxis (keppra 500mg  po bid) as prescribed in conjunction with neurology follow up q2 months in pregnancy 2. Continue current medication (zyprexa 5mg  po bid) for mood stabilization and psychiatry follow up monthly at a minimum during pregnancy (ie, high risk for deterioration of mood and function) 3. Targeted survey in 5 weeks 4. First  trimester NT today (see genetic counseling  note separately) with NIPS (panorama) 5. Interval growth q6-8 weeks in the mid and late trimesters 6. Anesthesia consultation in third trimester 7. Consider fetal echocardiogram (seizure in first trimester is a risk for heart defect; ie, embryopathy in setting of hypoxia with maternal seizure) at around 18-20 weeks 8. Delivery 39-40 weeks  9. Would ensure preconception folic acid and in first trimester in future pregnancies.  Time Spent: I spent in excess of 30 minutes in consultation with this patient to review records, evaluate her case, and provide her with an adequate discussion and education.  More than 50% of this time was spent in direct face-to-face counseling.  It was a pleasure seeing your patient in the office today.  Thank you for consultation. Please do not hesitate to contact our service for any further questions.   Thank you,  Delman Cheadle Harl Favor, Delman Cheadle, MD, MS, FACOG Assistant Professor Section of [place]

## 2015-09-08 DIAGNOSIS — Z3A13 13 weeks gestation of pregnancy: Secondary | ICD-10-CM | POA: Insufficient documentation

## 2015-09-08 DIAGNOSIS — Z148 Genetic carrier of other disease: Secondary | ICD-10-CM | POA: Insufficient documentation

## 2015-09-08 NOTE — Progress Notes (Signed)
Genetic Counseling  High-Risk Gestation Note  Appointment Date:  09/06/2015 Referred By: Mora Bellman, MD Date of Birth:  July 24, 1990 Partner:  Lianne Bushy   Pregnancy History: [id number] Estimated Date of Delivery: 03/13/16 Estimated Gestational Age: 24w0dAttending: JViann Fish MD   I met with Mrs. SMyrtis Hoppingand her husband, Mr. NLianne Bushy for genetic counseling because of a family history of Fragile X syndrome.  In summary:  Discussed family history of Fragile X syndrome  Patient reported that she and her mother are carriers for Fragile X syndrome  One of her brothers has Fragile X syndrome  Reviewed X-linked inheritance  1 in 4 risk for affected female and 1 in 4 risk for female carrier which can display features in some cases  Discussed options of screening / testing  Prenatal diagnosis for Fragile X via CVS or amniocentesis- patient declined  Patient declined repeating Fragile X carrier screening for herself given that she is not interested in prenatal diagnosis  First screen- NT ultrasound performed today within normal range  NIPS- patient elected to pursue this given her interest in aneuploidy screening but also to help assess risk for Fragile X, given the X-linked inheritance of the condition  Discussed general population carrier screening options- patient declined CF, SMA, and hemoglobinopathies  We began by reviewing the family history in detail. Mrs. Olenick reported that her brother has Fragile X syndrome. He is 27years old and was diagnosed with autism at age 27or 27years old. Mrs. Arnold reported that following his diagnosis, their mother was determined to be a Fragile X carrier. Mrs. Wynter reported a personal diagnosis of partial autism/Asperger syndrome for herself and reported that she was found to be a Fragile X carrier. She did not have records documenting this report and was unsure where the Fragile X testing was performed.   No additional  relatives were known to have Fragile X syndrome or be carriers of Fragile X.   We reviewed Fragile X syndrome and X-linked inheritance. We reviewed chromosomes and that the 23rd chromosome pair is referred to as the sex chromosomes. Males typically have one X and one Y chromosomes, and females typically have two X chromosomes.  In X-linked inheritance, a female passes on his X chromosome to all of his daughters and none of his sons. For a female, there is a 1 in 2 (50%) chance of passing on a particular X chromosome to each child. Thus, when a female is a carrier for an X-linked condition, having one copy of a nonworking gene on one X chromosome, there are 4 possibilities: 1) a daughter who inherits the nonworking gene and is also a carrier, 2) a daughter who inherits the working copy of the gene and is neither a carrier nor affected, 3) a son who inherits the nonworking copy of the gene and is affected, or 4) a son who inherits the working copy of the gene and is unaffected.   Fragile X syndrome is a form of inherited intellectual disability where predominantly affected males are born to unaffected females, though there is variability in the condition. More than 99% of individuals with Fragile X syndrome results from an expanded segment (an expansion of the CGG trinucleotide repeat) of the FMR1 gene on the X chromosome. (Less than 1% of individuals with Fragile X syndrome have a deletion of FMR1 or an intragenic FMR1 mutation.) Regarding the number of trinucleotide repeats in the FMR1 gene, less than 45 repeats of this region is  considered within normal limits. People with 45-54 repeats are typically considered to be in the "intermediate" or "gray zone" range. Individuals with 55-200 repeats are considered "premutation carriers" and are at personal risk to develop certain health concerns including primary ovarian insufficiency (POI) in females and Fragile X-associated tremor/ataxia syndrome in males and females.  Females who are premutation carriers are at increased risk to have children affected with Fragile X syndrome, given the chance for a premutation to expand to a full mutation when transmitted to offspring. Individuals with greater than 200 repeats have full mutations and are expected to be clinically affected, if female, and be carriers or possibly clinically affected, if female. Carriers of full mutations can be asymptomatic or have variable, typically milder symptoms. We do not have documentation confirming the carrier status or the particular type of causative genetic change. In the case of an expansion in FMR1, documentation of the size of the expansion would be helpful to accurately assess risk assessment. The reported family history is suggestive of the presence of a full mutation (rather than a premutation), and we thus, counseled on recurrence risk based on this information. We discussed the option of repeat Fragile X carrier testing for Mrs. Ellington today to clarify her carrier status.   Given the reported family history and the inheritance, we discussed that each of Mrs. Rought's pregnancy would have a 1 in 4 (25%) chance for each of the following: 1) an unaffected female who is not a carrier, 2) an unaffected female, 3) a carrier female who could possibly have symptoms of Fragile X syndrome, and 4) a female with Fragile X syndrome. Additional information regarding Mrs. Siegenthaler's carrier status may alter this information. We discussed the option of prenatal diagnosis via amniocentesis. We discussed the risks, benefits, and limitations of amniocentesis including the associated 1 in 833-825 risk for complications, including spontaneous pregnancy loss. We discussed that prior to pursuing prenatal diagnosis, documentation of Mrs. Huffaker's previous Fragile X studies or repeating Fragile X carrier testing for her, would be needed. Mrs. Alsteen declined amniocentesis, stating she is not interested in prenatal diagnosis  for Fragile X syndrome and thus, also declined redraw for Fragile X carrier screening for herself.  We discussed screening for fetal sex during pregnancy would help further assess recurrence risk via ultrasound or noninvasive prenatal screening (NIPS) for aneuploidy.   We reviewed available screening and diagnostic options.  Regarding screening tests, we discussed the options of First screen, Quad screen and ultrasound. They understand that screening tests are used to modify a patient's a priori risk for aneuploidy, typically based on age.  This estimate provides a pregnancy specific risk assessment. Given Mrs. Zajicek's interest in screening for fetal aneuploidy as well as adjusting recurrence risk for Fragile X syndrome by assessing fetal sex, we also discussed the screening option of noninvasive prenatal screening (NIPS)/prenatal cell free DNA testing. We discussed the methodology, conditions for which it assesses, and detection rates and false positive rates of each. We discussed the possible results that the tests might provide including: positive, negative, unanticipated, and no result. Finally, they were counseled regarding the cost of each option and potential out of pocket expenses. After careful consideration, Mrs. Szydlowski elected to proceed with NIPS (Panorama through [facility] laboratory) today. Nuchal translucency ultrasound was performed today and was within normal range. Complete ultrasound results reported separately.   The patient also reported multiple relatives with mental health conditions. She has bipolar disorder and schizoaffective disorder. Her other brother, who does not  have Fragile X, has bipolar disorder and schizophrenia. The patient's father reportedly also has mental health disorders. Mr. Lavalle reported that he has schizophrenia paranoia, with onset following trauma in a car crash. He has no additional family history of mental health conditions. We discussed that for the majority of  cases of mental health conditions, an underlying genetic cause is not known but a combination of genetic and environmental factors (multifactorial inheritance) are suspected to contribute to their onset.  Recurrence risk for the couple's offspring would be increased above the general population risk.  We discussed that it might be helpful for pediatricians to be aware of this family history to ensure that family members are followed appropriately. The patient understands that prenatal testing or screening is not available for the majority of mental health conditions.   Mrs. Watrous reported a paternal uncle with cleft palate (she was unsure if it was + or - cleft lip) and spina bifida. He did not require surgery for spina bifida to her knowledge and does not appear to have symptoms related to spina bifida. The patient reported that she had very limited information regarding this uncle's medical history. The incidence of isolated cleft palate is estimated to be 1 in 2,500, and the incidence of cleft lip with or without cleft palate is approximately 1 in 1,000, varying with ethnicity. Cleft palate is most often an isolated condition, but can be present as one feature of an underlying genetic condition in combination with other birth defects or features. Clefting can also be associated with maternal environmental exposures during pregnancy. When there is no syndrome or known underlying factor as the cause, multifactorial inheritance is suspected involving a combination of genetic and environmental contributing factors. We discussed that the spina bifida reported may fit more with spina bifida occulta, given that this relative does not appear to be symptomatic. Spina bifida occulta, or closed spina bifida, often describes an absence of one or two vertebral arches, without a visible lesion, which may be discovered incidentally following an X-ray for backache or other unrelated symptoms. Some forms of spina bifida  occulta can cause symptoms, such as the presence of a tethered cord. Limited information exists regarding the prevalence and underlying cause of spina bifida occulta. Studies suggest that in cases of asymptomatic spina bifida occulta, recurrence risk to relatives is not likely increased. We reviewed that in the case that this relative's features are due to a single underlying etiology, recurrence risk would depend on that etiology.  A second trimester targeted ultrasound may detect facial clefts. However, it is important to remember that not all clefting can be detected prenatally, and isolated cleft palate is difficult to detect on prenatal ultrasound. Spina bifida occulta would not be expected to be detected on prenatal ultrasound.   Mr. Cannady reported a brother who died at age 85-30 years of age due to congenital heart disease. He had limited information regarding the specific type of congenital heart disease, and reported that this sibling was otherwise healthy. Congenital heart defects occur in approximately 1% of pregnancies.  Congenital heart defects may occur due to multifactorial influences, chromosomal abnormalities, genetic syndromes or environmental exposures.  Isolated heart defects are generally multifactorial.  Given the reported family history and assuming multifactorial inheritance, the risk for a congenital heart defect in the current pregnancy would be approximately 1-2%. We discussed availability of detailed ultrasound and possibly fetal echocardiogram to assess fetal heart development in detail. They understand that ultrasound cannot diagnose or rule out all  birth defects or genetic conditions prenatally.   The family histories were otherwise found to be noncontributory for birth defects, intellectual disability, and known genetic conditions. Without further information regarding the provided family history, an accurate genetic risk cannot be calculated. Further genetic counseling is  warranted if more information is obtained.  Mrs. Myrtis Hopping was provided with written information regarding cystic fibrosis (CF), spinal muscular atrophy (SMA) and hemoglobinopathies including the carrier frequency, availability of carrier screening and prenatal diagnosis if indicated.  In addition, we discussed that CF and hemoglobinopathies are routinely screened for as part of the [place] newborn screening panel.  After further discussion, she declined screening for CF, SMA and hemoglobinopathies today.  Mrs. Myrtis Hopping denied the use of alcohol, tobacco or street drugs during the pregnancy. She denied significant viral illnesses during the course of her pregnancy. Her medical and surgical histories were contributory for schizoaffective disorder, bipolar disorder, seizure disorder, and asthma.She is treated with medication for these conditions. Mrs. Collazos had maternal fetal medicine consultation today to review this medical history. See separate MFM consult note.    I counseled this couple regarding the above risks and available options.  The approximate face-to-face time with the genetic counselor was 40 minutes.  Chipper Oman, MS Certified Genetic Counselor 09/08/2015

## 2015-09-13 ENCOUNTER — Other Ambulatory Visit (HOSPITAL_COMMUNITY): Payer: Self-pay

## 2015-09-13 ENCOUNTER — Telehealth (HOSPITAL_COMMUNITY): Payer: Self-pay | Admitting: MS"

## 2015-09-13 NOTE — Telephone Encounter (Signed)
Left message for patient to return call.   Santiago Glad Idabell Picking 09/13/2015 2:34 PM   Called Myrtis Hopping and her husband, Dorothye Chesebro to discuss her prenatal cell free DNA test results.  Mrs. Myrtis Hopping had Panorama testing through [facility] laboratories.  Testing was offered because Mrs. Ehrlich is a Fragile X carrier and also desired screening for fetal aneuploidy.   The patient was identified by name and DOB.  We reviewed that these are within normal limits, showing a less than 1 in 10,000 risk for trisomies 21, 18 and 13, and monosomy X (Turner syndrome).  In addition, the risk for triploidy and sex chromosome trisomies (47,XXX and 47,XXY) was also low risk.  We reviewed that this testing identifies > 99% of pregnancies with trisomy 34, trisomy 55, sex chromosome trisomies (47,XXX and 47,XXY), and triploidy. The detection rate for trisomy 18 is 96%.  The detection rate for monosomy X is ~92%.  The false positive rate is <0.1% for all conditions. Testing was also consistent with female fetal sex.  The patient did wish to know fetal sex.  She understands that this testing does not identify all genetic conditions.  All questions were answered to her satisfaction, she was encouraged to call with additional questions or concerns.  Chipper Oman, MS Insurance risk surveyor

## 2015-09-15 ENCOUNTER — Ambulatory Visit (INDEPENDENT_AMBULATORY_CARE_PROVIDER_SITE_OTHER): Payer: Medicare Other | Admitting: Obstetrics & Gynecology

## 2015-09-15 VITALS — BP 107/68 | HR 69 | Wt 167.1 lb

## 2015-09-15 DIAGNOSIS — O0992 Supervision of high risk pregnancy, unspecified, second trimester: Secondary | ICD-10-CM

## 2015-09-15 NOTE — Progress Notes (Signed)
Subjective:a  [name] is a 25 y.o. G2P0010 at [redacted]w[redacted]d being seen today for ongoing prenatal care.  She is currently monitored for the following issues for this high-risk pregnancy and has Schizoaffective disorder, bipolar type by history(HCC); Borderline personality disorder; PTSD (post-traumatic stress disorder); Convulsion ([place]); Asthma; Pregnancy; Vitamin D deficiency; Chronic migraine; Supervision of high risk pregnancy, antepartum; Seizure disorder ([place]); Seizure disorder in pregnancy, antepartum ([place]); Rh negative, antepartum; 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; and [redacted] weeks gestation of pregnancy on her problem list.  Patient reports no complaints.  Contractions: Not present. Vag. Bleeding: None.   . Denies leaking of fluid.   The following portions of the patient's history were reviewed and updated as appropriate: allergies, current medications, past family history, past medical history, past social history, past surgical history and problem list. Problem list updated.  Objective:   Vitals:   09/15/15 1257  BP: 107/68  Pulse: 69  Weight: 167 lb 1.6 oz (75.8 kg)    Fetal Status: Fetal Heart Rate (bpm): 154         General:  Alert, oriented and cooperative. Patient is in no acute distress.  Skin: Skin is warm and dry. No rash noted.   Cardiovascular: Normal heart rate noted  Respiratory: Normal respiratory effort, no problems with respiration noted  Abdomen: Soft, gravid, appropriate for gestational age. Pain/Pressure: Absent     Pelvic:  Cervical exam deferred        Extremities: Normal range of motion.  Edema: None  Mental Status: Normal mood and affect. Normal behavior. Normal judgment and thought content.   Urinalysis:      Assessment and Plan:  Pregnancy: G2P0010 at [redacted]w[redacted]d  There are no diagnoses linked to this encounter. Preterm labor symptoms and general obstetric precautions including but not limited to vaginal bleeding, contractions, leaking of fluid and fetal movement  were reviewed in detail with the patient. Please refer to After Visit Summary for other counseling recommendations.  Return in about 4 weeks (around 10/13/2015). [name] in 4 weeks is scheduled  Woodroe Mode, MD

## 2015-10-11 ENCOUNTER — Emergency Department (HOSPITAL_COMMUNITY)
Admission: EM | Admit: 2015-10-11 | Discharge: 2015-10-12 | Disposition: A | Discharge status: Home / Self Care | Payer: Medicare Other | Attending: Emergency Medicine | Admitting: Emergency Medicine

## 2015-10-11 ENCOUNTER — Encounter (HOSPITAL_COMMUNITY): Payer: Self-pay | Admitting: *Deleted

## 2015-10-11 DIAGNOSIS — R42 Dizziness and giddiness: Secondary | ICD-10-CM | POA: Insufficient documentation

## 2015-10-11 DIAGNOSIS — R079 Chest pain, unspecified: Secondary | ICD-10-CM | POA: Insufficient documentation

## 2015-10-11 DIAGNOSIS — J45909 Unspecified asthma, uncomplicated: Secondary | ICD-10-CM | POA: Diagnosis not present

## 2015-10-11 DIAGNOSIS — Z3A18 18 weeks gestation of pregnancy: Secondary | ICD-10-CM | POA: Diagnosis not present

## 2015-10-11 DIAGNOSIS — O9989 Other specified diseases and conditions complicating pregnancy, childbirth and the puerperium: Secondary | ICD-10-CM | POA: Diagnosis present

## 2015-10-11 DIAGNOSIS — Z87891 Personal history of nicotine dependence: Secondary | ICD-10-CM | POA: Insufficient documentation

## 2015-10-11 DIAGNOSIS — Z5321 Procedure and treatment not carried out due to patient leaving prior to being seen by health care provider: Secondary | ICD-10-CM | POA: Diagnosis not present

## 2015-10-11 LAB — CBC
HCT: 34.1 % — ABNORMAL LOW (ref 36.0–46.0)
HEMOGLOBIN: 11.1 g/dL — AB (ref 12.0–15.0)
MCH: 28.2 pg (ref 26.0–34.0)
MCHC: 32.6 g/dL (ref 30.0–36.0)
MCV: 86.5 fL (ref 78.0–100.0)
PLATELETS: 230 10*3/uL (ref 150–400)
RBC: 3.94 MIL/uL (ref 3.87–5.11)
RDW: 13.1 % (ref 11.5–15.5)
WBC: 12.5 10*3/uL — ABNORMAL HIGH (ref 4.0–10.5)

## 2015-10-11 LAB — I-STAT TROPONIN, ED: TROPONIN I, POC: 0 ng/mL (ref 0.00–0.08)

## 2015-10-11 LAB — BASIC METABOLIC PANEL
ANION GAP: 8 (ref 5–15)
BUN: <5 mg/dL — ABNORMAL LOW (ref 6–20)
CALCIUM: 9.5 mg/dL (ref 8.9–10.3)
CO2: 21 mmol/L — AB (ref 22–32)
CREATININE: 0.55 mg/dL (ref 0.44–1.00)
Chloride: 108 mmol/L (ref 101–111)
GLUCOSE: 114 mg/dL — AB (ref 65–99)
Potassium: 4 mmol/L (ref 3.5–5.1)
Sodium: 137 mmol/L (ref 135–145)

## 2015-10-11 NOTE — ED Triage Notes (Signed)
Pt reports centra/left chest pain that started today along with dizziness. Pt is [redacted] weeks pregnant as well. No complications during pregnancy.

## 2015-10-12 NOTE — ED Notes (Signed)
This nurse went in pts room to assess her and zhe stated " I feel better I am ready to go. I dont want to wait to see the dr. I have a OB appointment tomorrow so I will let her check me out". I notified the pt the MD would be in shortly and I was here to assess her. She then stated "well I don't want to be assessed im ready to go". I explained the risk of leaving AMA and pt stated she understood. AMA form signed. Pt in NAD and ambulatory out of the emergency room with significant other.

## 2015-10-12 NOTE — ED Provider Notes (Signed)
12:45 AM  Pt LWBS after triage.  I did not see patient.  Was not informed she was leaving until after already gone.   [place], DO 10/12/15 0045

## 2015-10-13 ENCOUNTER — Other Ambulatory Visit (HOSPITAL_COMMUNITY): Payer: Self-pay | Admitting: Obstetrics and Gynecology

## 2015-10-13 ENCOUNTER — Encounter (HOSPITAL_COMMUNITY): Payer: Self-pay

## 2015-10-13 ENCOUNTER — Ambulatory Visit (HOSPITAL_COMMUNITY)
Admission: RE | Admit: 2015-10-13 | Discharge: 2015-10-13 | Disposition: A | Discharge status: Home / Self Care | Payer: Medicare Other | Source: Ambulatory Visit | Attending: Obstetrics and Gynecology | Admitting: Obstetrics and Gynecology

## 2015-10-13 DIAGNOSIS — O99352 Diseases of the nervous system complicating pregnancy, second trimester: Secondary | ICD-10-CM | POA: Insufficient documentation

## 2015-10-13 DIAGNOSIS — Z1389 Encounter for screening for other disorder: Secondary | ICD-10-CM

## 2015-10-13 DIAGNOSIS — Z3A18 18 weeks gestation of pregnancy: Secondary | ICD-10-CM

## 2015-10-13 DIAGNOSIS — Z36 Encounter for antenatal screening of mother: Secondary | ICD-10-CM | POA: Diagnosis not present

## 2015-10-13 DIAGNOSIS — Z363 Encounter for antenatal screening for malformations: Secondary | ICD-10-CM

## 2015-10-13 DIAGNOSIS — G40909 Epilepsy, unspecified, not intractable, without status epilepticus: Secondary | ICD-10-CM

## 2015-10-13 DIAGNOSIS — O360190 Maternal care for anti-D [Rh] antibodies, unspecified trimester, not applicable or unspecified: Secondary | ICD-10-CM

## 2015-10-13 DIAGNOSIS — Z148 Genetic carrier of other disease: Secondary | ICD-10-CM

## 2015-10-13 DIAGNOSIS — O99351 Diseases of the nervous system complicating pregnancy, first trimester: Secondary | ICD-10-CM

## 2015-10-15 ENCOUNTER — Inpatient Hospital Stay (HOSPITAL_COMMUNITY)
Admission: AD | Admit: 2015-10-15 | Discharge: 2015-10-15 | Disposition: A | Discharge status: Home / Self Care | Payer: Medicare Other | Source: Ambulatory Visit | Attending: Obstetrics and Gynecology | Admitting: Obstetrics and Gynecology

## 2015-10-15 ENCOUNTER — Encounter (HOSPITAL_COMMUNITY): Payer: Self-pay | Admitting: *Deleted

## 2015-10-15 DIAGNOSIS — Z888 Allergy status to other drugs, medicaments and biological substances status: Secondary | ICD-10-CM | POA: Diagnosis not present

## 2015-10-15 DIAGNOSIS — O99612 Diseases of the digestive system complicating pregnancy, second trimester: Secondary | ICD-10-CM | POA: Insufficient documentation

## 2015-10-15 DIAGNOSIS — F250 Schizoaffective disorder, bipolar type: Secondary | ICD-10-CM | POA: Diagnosis not present

## 2015-10-15 DIAGNOSIS — O09892 Supervision of other high risk pregnancies, second trimester: Secondary | ICD-10-CM | POA: Insufficient documentation

## 2015-10-15 DIAGNOSIS — Z148 Genetic carrier of other disease: Secondary | ICD-10-CM

## 2015-10-15 DIAGNOSIS — R079 Chest pain, unspecified: Secondary | ICD-10-CM | POA: Insufficient documentation

## 2015-10-15 DIAGNOSIS — R0789 Other chest pain: Secondary | ICD-10-CM

## 2015-10-15 DIAGNOSIS — Z3A18 18 weeks gestation of pregnancy: Secondary | ICD-10-CM | POA: Insufficient documentation

## 2015-10-15 DIAGNOSIS — R51 Headache: Secondary | ICD-10-CM | POA: Diagnosis not present

## 2015-10-15 DIAGNOSIS — Z87891 Personal history of nicotine dependence: Secondary | ICD-10-CM | POA: Insufficient documentation

## 2015-10-15 DIAGNOSIS — Z91018 Allergy to other foods: Secondary | ICD-10-CM | POA: Insufficient documentation

## 2015-10-15 DIAGNOSIS — Z91030 Bee allergy status: Secondary | ICD-10-CM | POA: Insufficient documentation

## 2015-10-15 DIAGNOSIS — O360190 Maternal care for anti-D [Rh] antibodies, unspecified trimester, not applicable or unspecified: Secondary | ICD-10-CM

## 2015-10-15 DIAGNOSIS — F840 Autistic disorder: Secondary | ICD-10-CM | POA: Diagnosis not present

## 2015-10-15 DIAGNOSIS — F603 Borderline personality disorder: Secondary | ICD-10-CM | POA: Diagnosis not present

## 2015-10-15 DIAGNOSIS — Z91013 Allergy to seafood: Secondary | ICD-10-CM | POA: Insufficient documentation

## 2015-10-15 DIAGNOSIS — G250 Essential tremor: Secondary | ICD-10-CM | POA: Diagnosis not present

## 2015-10-15 DIAGNOSIS — O99342 Other mental disorders complicating pregnancy, second trimester: Secondary | ICD-10-CM | POA: Diagnosis not present

## 2015-10-15 DIAGNOSIS — G43001 Migraine without aura, not intractable, with status migrainosus: Secondary | ICD-10-CM | POA: Diagnosis not present

## 2015-10-15 DIAGNOSIS — F4310 Post-traumatic stress disorder, unspecified: Secondary | ICD-10-CM | POA: Insufficient documentation

## 2015-10-15 DIAGNOSIS — Z885 Allergy status to narcotic agent status: Secondary | ICD-10-CM | POA: Insufficient documentation

## 2015-10-15 DIAGNOSIS — O26892 Other specified pregnancy related conditions, second trimester: Secondary | ICD-10-CM | POA: Diagnosis present

## 2015-10-15 LAB — RAPID URINE DRUG SCREEN, HOSP PERFORMED
AMPHETAMINES: NOT DETECTED
BARBITURATES: NOT DETECTED
BENZODIAZEPINES: NOT DETECTED
Cocaine: NOT DETECTED
Opiates: NOT DETECTED
TETRAHYDROCANNABINOL: NOT DETECTED

## 2015-10-15 LAB — URINALYSIS, ROUTINE W REFLEX MICROSCOPIC
Bilirubin Urine: NEGATIVE
GLUCOSE, UA: NEGATIVE mg/dL
Hgb urine dipstick: NEGATIVE
KETONES UR: NEGATIVE mg/dL
LEUKOCYTES UA: NEGATIVE
Nitrite: NEGATIVE
PH: 6 (ref 5.0–8.0)
Protein, ur: NEGATIVE mg/dL

## 2015-10-15 MED ORDER — BUTALBITAL-APAP-CAFFEINE 50-325-40 MG PO TABS
1.0000 | ORAL_TABLET | Freq: Once | ORAL | Status: AC
  Administered 2015-10-15: 1 via ORAL
  Filled 2015-10-15: qty 1

## 2015-10-15 MED ORDER — PRENATAL COMPLETE 14-0.4 MG PO TABS: 1.0000 | ORAL_TABLET | Freq: Every day | ORAL | 11 refills | Status: DC

## 2015-10-15 NOTE — MAU Note (Signed)
Pt reports she has been having mild chest pain and a bad headache with some dizziness since Tuesday. Went to [facility] on Tuesday had EKG and they said it ws normal and then she left AMA. Stated the headache goes away for a few hours when she takes tylenol but then it comes back. Chest pain feels like a heaviness on her chest. Denies any SOB.

## 2015-10-15 NOTE — MAU Provider Note (Signed)
History   G2P0010 @ 18 wks in with c/o chest pain, and headache since Tuesday. Pt has hx of migraines, asthma, borderline personality disorder, carrier of fragile x, PTSD.  CSN: [id number]  Arrival date & time 10/15/15  1637   None     Chief Complaint  Patient presents with  . Headache  . Chest Pain    HPI  Past Medical History:  Diagnosis Date  . Acid reflux   . Asthma   . Autism   . 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 Chronic constipation   . Depression   . Drug-seeking behavior   . Essential tremor   . Personality disorder   . Schizo-affective psychosis ([place])   . Schizoaffective disorder, bipolar type ([place])   . Seizures ([place])     Past Surgical History:  Procedure Laterality Date  . MOUTH SURGERY      Family History  Problem Relation Age of Onset  . Mental illness Father   . Asthma Father   . PDD Brother   . Seizures Brother     Social History  Substance Use Topics  . Smoking status: Former Smoker    Packs/day: 0.00  . Smokeless tobacco: Never Used     Comment: Smoked for 2  years age [age]  . Alcohol use 0.6 oz/week    1 Standard drinks or equivalent per week     Comment: "not with preg"    OB History    Gravida Para Term Preterm AB Living   2       1     SAB TAB Ectopic Multiple Live Births   1              Review of Systems  Constitutional: Negative.   Eyes: Negative.   Respiratory: Negative.   Cardiovascular: Positive for chest pain.  Gastrointestinal: Negative.   Endocrine: Negative.   Genitourinary: Negative.   Musculoskeletal: Negative.   Skin: Negative.   Allergic/Immunologic: Negative.   Neurological: Positive for headaches.  Hematological: Negative.   Psychiatric/Behavioral: Negative.     Allergies  Bee venom; Coconut flavor; Geodon [ziprasidone hcl]; Haloperidol and related; Lithium; Oxycodone; Seroquel [quetiapine fumarate]; Percocet [oxycodone-acetaminophen]; Prilosec [omeprazole]; Shellfish allergy; Tegretol  [carbamazepine]; and Phenergan [promethazine hcl]  Home Medications   Current Outpatient Rx  . Order #: [id number] Class: Normal    BP 118/68   Pulse 103   Temp 98.2 F (36.8 C)   Resp 18   Ht 5\' 4"  (1.626 m)   Wt 176 lb (79.8 kg)   LMP 06/07/2015   SpO2 99%   BMI 30.21 kg/m   Physical Exam  Constitutional: She is oriented to person, place, and time. She appears well-developed and well-nourished.  HENT:  Head: Normocephalic.  Eyes: Pupils are equal, round, and reactive to light.  Neck: Normal range of motion.  Cardiovascular: Normal rate, regular rhythm, normal heart sounds and intact distal pulses.   Pulmonary/Chest: Effort normal and breath sounds normal.  Abdominal: Soft. Bowel sounds are normal.  Musculoskeletal: Normal range of motion.  Neurological: She is alert and oriented to person, place, and time. She has normal reflexes.  Skin: Skin is warm and dry.  Psychiatric: She has a normal mood and affect. Her behavior is normal. Judgment and thought content normal.    MAU Course  Procedures (including critical care time)  Labs Reviewed  URINALYSIS, ROUTINE W REFLEX MICROSCOPIC (NOT AT [facility])  URINE RAPID DRUG SCREEN, HOSP PERFORMED   No results found.  1. 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   2. Rh negative, antepartum, unspecified trimester, not applicable or unspecified fetus       MDM  EKG normal, will treat migraine with fioricet. Pt states headache and chest pain better wants d/c home

## 2015-10-15 NOTE — Discharge Instructions (Signed)
Chest Pain Observation °It is often hard to give a specific diagnosis for the cause of chest pain. Among other possibilities your symptoms might be caused by inadequate oxygen delivery to your heart (angina). Angina that is not treated or evaluated can lead to a heart attack (myocardial infarction) or death. °Blood tests, electrocardiograms, and X-rays may have been done to help determine a possible cause of your chest pain. After evaluation and observation, your health care provider has determined that it is unlikely your pain was caused by an unstable condition that requires hospitalization. However, a full evaluation of your pain may need to be completed, with additional diagnostic testing as directed. It is very important to keep your follow-up appointments. Not keeping your follow-up appointments could result in permanent heart damage, disability, or death. If there is any problem keeping your follow-up appointments, you must call your health care provider. °HOME CARE INSTRUCTIONS  °Due to the slight chance that your pain could be angina, it is important to follow your health care provider's treatment plan and also maintain a healthy lifestyle: °· Maintain or work toward achieving a healthy weight. °· Stay physically active and exercise regularly. °· Decrease your salt intake. °· Eat a balanced, healthy diet. Talk to a dietitian to learn about heart-healthy foods. °· Increase your fiber intake by including whole grains, vegetables, fruits, and nuts in your diet. °· Avoid situations that cause stress, anger, or depression. °· Take medicines as advised by your health care provider. Report any side effects to your health care provider. Do not stop medicines or adjust the dosages on your own. °· Quit smoking. Do not use nicotine patches or gum until you check with your health care provider. °· Keep your blood pressure, blood sugar, and cholesterol levels within normal limits. °· Limit alcohol intake to no more than  1 drink per day for women who are not pregnant and 2 drinks per day for men. °· Do not abuse drugs. °SEEK IMMEDIATE MEDICAL CARE IF: °You have severe chest pain or pressure which may include symptoms such as: °· You feel pain or pressure in your arms, neck, jaw, or back. °· You have severe back or abdominal pain, feel sick to your stomach (nauseous), or throw up (vomit). °· You are sweating profusely. °· You are having a fast or irregular heartbeat. °· You feel short of breath while at rest. °· You notice increasing shortness of breath during rest, sleep, or with activity. °· You have chest pain that does not get better after rest or after taking your usual medicine. °· You wake from sleep with chest pain. °· You are unable to sleep because you cannot breathe. °· You develop a frequent cough or you are coughing up blood. °· You feel dizzy, faint, or experience extreme fatigue. °· You develop severe weakness, dizziness, fainting, or chills. °Any of these symptoms may represent a serious problem that is an emergency. Do not wait to see if the symptoms will go away. Call your local emergency services (911 in the [place] Do not drive yourself to the hospital. °MAKE SURE YOU: °· Understand these instructions. °· Will watch your condition. °· Will get help right away if you are not doing well or get worse. °  °This information is not intended to replace advice given to you by your health care provider. Make sure you discuss any questions you have with your health care provider. °  °Document Released: 02/24/2010 Document Revised: 01/27/2013 Document Reviewed: 07/24/2012 °Elsevier Interactive Patient   Education 2016 [employer].  Migraine Headache A migraine headache is an intense, throbbing pain on one or both sides of your head. A migraine can last for 30 minutes to several hours. CAUSES  The exact cause of a migraine headache is not always known. However, a migraine may be caused when nerves in the brain become  irritated and release chemicals that cause inflammation. This causes pain. Certain things may also trigger migraines, such as:  Alcohol.  Smoking.  Stress.  Menstruation.  Aged cheeses.  Foods or drinks that contain nitrates, glutamate, aspartame, or tyramine.  Lack of sleep.  Chocolate.  Caffeine.  Hunger.  Physical exertion.  Fatigue.  Medicines used to treat chest pain (nitroglycerine), birth control pills, estrogen, and some blood pressure medicines. SIGNS AND SYMPTOMS  Pain on one or both sides of your head.  Pulsating or throbbing pain.  Severe pain that prevents daily activities.  Pain that is aggravated by any physical activity.  Nausea, vomiting, or both.  Dizziness.  Pain with exposure to bright lights, loud noises, or activity.  General sensitivity to bright lights, loud noises, or smells. Before you get a migraine, you may get warning signs that a migraine is coming (aura). An aura may include:  Seeing flashing lights.  Seeing bright spots, halos, or zigzag lines.  Having tunnel vision or blurred vision.  Having feelings of numbness or tingling.  Having trouble talking.  Having muscle weakness. DIAGNOSIS  A migraine headache is often diagnosed based on:  Symptoms.  Physical exam.  A CT scan or MRI of your head. These imaging tests cannot diagnose migraines, but they can help rule out other causes of headaches. TREATMENT Medicines may be given for pain and nausea. Medicines can also be given to help prevent recurrent migraines.  HOME CARE INSTRUCTIONS  Only take over-the-counter or prescription medicines for pain or discomfort as directed by your health care provider. The use of long-term narcotics is not recommended.  Lie down in a dark, quiet room when you have a migraine.  Keep a journal to find out what may trigger your migraine headaches. For example, write down:  What you eat and drink.  How much sleep you get.  Any  change to your diet or medicines.  Limit alcohol consumption.  Quit smoking if you smoke.  Get 7-9 hours of sleep, or as recommended by your health care provider.  Limit stress.  Keep lights dim if bright lights bother you and make your migraines worse. SEEK IMMEDIATE MEDICAL CARE IF:   Your migraine becomes severe.  You have a fever.  You have a stiff neck.  You have vision loss.  You have muscular weakness or loss of muscle control.  You start losing your balance or have trouble walking.  You feel faint or pass out.  You have severe symptoms that are different from your first symptoms. MAKE SURE YOU:   Understand these instructions.  Will watch your condition.  Will get help right away if you are not doing well or get worse.   This information is not intended to replace advice given to you by your health care provider. Make sure you discuss any questions you have with your health care provider.   Document Released: 01/22/2005 Document Revised: 02/12/2014 Document Reviewed: 09/29/2012 Elsevier Interactive Patient Education [employer].

## 2015-10-15 NOTE — MAU Note (Signed)
Called respiratory for EKG.

## 2015-10-17 ENCOUNTER — Telehealth: Payer: Self-pay | Admitting: Neurology

## 2015-10-17 NOTE — Telephone Encounter (Signed)
I have spoken with Brandilyn this afternoon.  She sts. she has had a h/a for one week.  Has been treated in the ER for same.  Appt. given with RAS po 10-19-15/fim

## 2015-10-17 NOTE — Telephone Encounter (Signed)
Pt called said she is having migraines and is [redacted] weeks pregnant. Please call

## 2015-10-19 ENCOUNTER — Encounter: Payer: Self-pay | Admitting: Neurology

## 2015-10-19 ENCOUNTER — Emergency Department (HOSPITAL_COMMUNITY)
Admission: EM | Admit: 2015-10-19 | Discharge: 2015-10-20 | Disposition: A | Discharge status: Home / Self Care | Payer: Medicare Other | Attending: Emergency Medicine | Admitting: Emergency Medicine

## 2015-10-19 ENCOUNTER — Ambulatory Visit (INDEPENDENT_AMBULATORY_CARE_PROVIDER_SITE_OTHER): Payer: Medicare Other | Admitting: Neurology

## 2015-10-19 ENCOUNTER — Encounter (HOSPITAL_COMMUNITY): Payer: Self-pay

## 2015-10-19 VITALS — BP 110/56 | HR 84 | Resp 14 | Ht 64.0 in | Wt 175.0 lb

## 2015-10-19 DIAGNOSIS — O9989 Other specified diseases and conditions complicating pregnancy, childbirth and the puerperium: Secondary | ICD-10-CM | POA: Insufficient documentation

## 2015-10-19 DIAGNOSIS — R51 Headache: Secondary | ICD-10-CM | POA: Insufficient documentation

## 2015-10-19 DIAGNOSIS — IMO0002 Reserved for concepts with insufficient information to code with codable children: Secondary | ICD-10-CM

## 2015-10-19 DIAGNOSIS — J45909 Unspecified asthma, uncomplicated: Secondary | ICD-10-CM | POA: Insufficient documentation

## 2015-10-19 DIAGNOSIS — G43709 Chronic migraine without aura, not intractable, without status migrainosus: Secondary | ICD-10-CM

## 2015-10-19 DIAGNOSIS — Z3A19 19 weeks gestation of pregnancy: Secondary | ICD-10-CM | POA: Diagnosis not present

## 2015-10-19 DIAGNOSIS — Z79899 Other long term (current) drug therapy: Secondary | ICD-10-CM | POA: Diagnosis not present

## 2015-10-19 DIAGNOSIS — M542 Cervicalgia: Secondary | ICD-10-CM | POA: Insufficient documentation

## 2015-10-19 DIAGNOSIS — R519 Headache, unspecified: Secondary | ICD-10-CM

## 2015-10-19 DIAGNOSIS — Z87891 Personal history of nicotine dependence: Secondary | ICD-10-CM | POA: Diagnosis not present

## 2015-10-19 DIAGNOSIS — Z3490 Encounter for supervision of normal pregnancy, unspecified, unspecified trimester: Secondary | ICD-10-CM

## 2015-10-19 DIAGNOSIS — G40909 Epilepsy, unspecified, not intractable, without status epilepticus: Secondary | ICD-10-CM | POA: Diagnosis not present

## 2015-10-19 DIAGNOSIS — F250 Schizoaffective disorder, bipolar type: Secondary | ICD-10-CM | POA: Diagnosis not present

## 2015-10-19 MED ORDER — LEVETIRACETAM 750 MG PO TABS: 750.0000 mg | ORAL_TABLET | Freq: Two times a day (BID) | ORAL | 11 refills | Status: DC

## 2015-10-19 MED ORDER — ACETAMINOPHEN 500 MG PO TABS
1000.0000 mg | ORAL_TABLET | Freq: Once | ORAL | Status: AC
  Administered 2015-10-20: 1000 mg via ORAL
  Filled 2015-10-19: qty 2

## 2015-10-19 MED ORDER — DIPHENHYDRAMINE HCL 50 MG/ML IJ SOLN
25.0000 mg | Freq: Once | INTRAMUSCULAR | Status: AC
  Administered 2015-10-19: 25 mg via INTRAVENOUS
  Filled 2015-10-19: qty 1

## 2015-10-19 MED ORDER — SODIUM CHLORIDE 0.9 % IV BOLUS (SEPSIS)
1000.0000 mL | Freq: Once | INTRAVENOUS | Status: AC
  Administered 2015-10-19: 1000 mL via INTRAVENOUS

## 2015-10-19 NOTE — Progress Notes (Signed)
[name]  PATIENT: [name] DOB: 1990-10-28  REFERRING DOCTOR OR PCP:   She sees [facility] for psychiatry (phone:    [phone]) SOURCE: Patient, husband, records in EMR  _________________________________   HISTORICAL  CHIEF COMPLAINT:  Chief Complaint  Patient presents with  . Headaches    [name] is [redacted] wks pregnant  Sts. h/a's are more frequent, more severe.  She is often in the ER.  Sts. she usually has to sleep h/a's off.  Sts. last sz. wa 9 weeks ago.  Sts. she is compliant with Keppra/fim  . Seizures    HISTORY OF PRESENT ILLNESS:  [name] is a 25 year old woman with schizoaffective disorder who has had multiple spells of altered consciousness and has frequent, near daily, migraine headaches.   She is now [redacted] weeks pregnant   Her pregnancy is doing well.    Headache:  She reports a history of migraines x many years.  HA's have been daily the past month.  Pain is frontal and throbbing.       She gets mild nausea but no vomiting.   She has photophobia and phonophobia.  Moving usually worsens the pain.    She feels dizzy when they occur.  Tylenol helps x 60 minutes to  Depakote helped much more than Keppra.    She had to stop the Depakote.  Spells:   She was having more seizure like spells but none in past 7 weeks.    She states she usually loses consciousness with the spells x 20-60 seconds.   Some have had convulsions.    She feels she is groggy x 1-3 minutes after a seizure and then is back to her baseline a few minutes later.    They have occurred while awake and while asleep.         Psych:   She has a complicated psychiatric history and has been diagnosed with Bipolar disease and with schizoaffective disease.   She is currently on Risperdal.   She was on  Abilify for schizoaffective disease and Prazosin for PTSD.  She also reports a lot of anxiety.   She sees Warden/ranger for [employer]     REVIEW OF SYSTEMS: Constitutional: No fevers,  chills, sweats, or change in appetite.   Poor sleep Eyes: No visual changes, double vision, eye pain Ear, nose and throat: No hearing loss, ear pain, nasal congestion, sore throat Cardiovascular: No chest pain, palpitations Respiratory: No shortness of breath at rest or with exertion.   No wheezes GastrointestinaI: No nausea, vomiting, diarrhea, abdominal pain, fecal incontinence Genitourinary: No dysuria, urinary retention or frequency.  No nocturia. Musculoskeletal: Some neck pain and back pain Integumentary: No rash, pruritus, skin lesions Neurological: as above Psychiatric: see above Endocrine: No palpitations, diaphoresis, change in appetite, change in weigh or increased thirst Hematologic/Lymphatic: No anemia, purpura, petechiae. Allergic/Immunologic: No itchy/runny eyes, nasal congestion, recent allergic reactions, rashes  ALLERGIES: Allergies  Allergen Reactions  . Bee Venom Anaphylaxis  . Coconut Flavor Anaphylaxis  . Geodon [Ziprasidone Hcl] Other (See Comments)    Paralysis of mouth  . Haloperidol And Related Other (See Comments)    Paralysis of mouth   . Lithium Other (See Comments)    Seizure like muscle spasms   . Oxycodone Other (See Comments)    hallucinations   . Seroquel [Quetiapine Fumarate] Other (See Comments)    TOO STRONG  . Percocet [Oxycodone-Acetaminophen] Other (See Comments)    Causes hallucinations.  . Prilosec [  Omeprazole] Nausea And Vomiting  . Shellfish Allergy Swelling  . Tegretol [Carbamazepine] Nausea And Vomiting  . Phenergan [Promethazine Hcl] Other (See Comments)    Chest pain      HOME MEDICATIONS:  Current Outpatient Prescriptions:  .  albuterol (PROVENTIL HFA;VENTOLIN HFA) 108 (90 Base) MCG/ACT inhaler, Inhale 1-2 puffs into the lungs every 6 (six) hours as needed for wheezing or shortness of breath. Reported on 05/05/2015, Disp: 1 Inhaler, Rfl: 1 .  calcium carbonate (TUMS - DOSED IN MG ELEMENTAL CALCIUM) 500 MG chewable  tablet, Chew 1 tablet by mouth daily., Disp: , Rfl:  .  cholecalciferol (VITAMIN D-400) 400 UNITS TABS tablet, Take 800 Units by mouth daily. , Disp: , Rfl:  .  diphenhydrAMINE (BENADRYL) 25 mg capsule, Take 25 mg by mouth every 6 (six) hours as needed., Disp: , Rfl:  .  docusate sodium (COLACE) 100 MG capsule, Take 200 mg by mouth daily. , Disp: , Rfl:  .  fluticasone-salmeterol (ADVAIR HFA) 115-21 MCG/ACT inhaler, Inhale 2 puffs into the lungs 2 (two) times daily., Disp: 1 Inhaler, Rfl: 5 .  levETIRAcetam (KEPPRA) 750 MG tablet, Take 1 tablet (750 mg total) by mouth 2 (two) times daily., Disp: 60 tablet, Rfl: 11 .  Menthol, Topical Analgesic, (BENGAY EX), Apply 1 application topically daily as needed (pain). , Disp: , Rfl:  .  Prenatal Vit-Fe Fumarate-FA (PRENATAL COMPLETE) 14-0.4 MG TABS, Take 1 tablet by mouth daily., Disp: 30 each, Rfl: 11 .  Pyridoxine HCl (B-6) 50 MG TABS, Take 100 mg by mouth 2 (two) times daily. , Disp: , Rfl:  .  senna (SENOKOT) 8.6 MG tablet, Take 2 tablets by mouth daily., Disp: , Rfl:  .  doxylamine, Sleep, (UNISOM) 25 MG tablet, Take 25 mg by mouth at bedtime as needed for sleep., Disp: , Rfl:  .  OLANZapine zydis (ZYPREXA) 5 MG disintegrating tablet, Take 1 tablet (5 mg total) by mouth 2 (two) times daily after a meal. (Patient not taking: Reported on 10/19/2015), Disp: 60 tablet, Rfl: 0 .  risperiDONE (RISPERDAL) 2 MG tablet, , Disp: , Rfl: 0  PAST MEDICAL HISTORY: Past Medical History:  Diagnosis Date  . Acid reflux   . Asthma   . Autism   . 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 Chronic constipation   . Depression   . Drug-seeking behavior   . Essential tremor   . Personality disorder   . Schizo-affective psychosis ([place])   . Schizoaffective disorder, bipolar type ([place])   . Seizures ([place])     PAST SURGICAL HISTORY: Past Surgical History:  Procedure Laterality Date  . MOUTH SURGERY      FAMILY HISTORY: Family History  Problem Relation Age of Onset  .  Mental illness Father   . Asthma Father   . PDD Brother   . Seizures Brother     SOCIAL HISTORY:  Social History   Social History  . Marital status: Married    Spouse name: N/A  . Number of children: 0  . Years of education: N/A   Occupational History  . disability    Social History Main Topics  . Smoking status: Former Smoker    Packs/day: 0.00  . Smokeless tobacco: Never Used     Comment: Smoked for 2  years age [age]  . Alcohol use 0.6 oz/week    1 Standard drinks or equivalent per week     Comment: "not with preg"  . Drug use: No  Comment: History of cocaine use at age 23 for 4 months  . Sexual activity: Yes    Birth control/ protection: None   Other Topics Concern  . Not on file   Social History Narrative   Marital status: married; guardian is Furniture conservator/restorer      Children: none      Lives: with husband in apartment      Employment:  Disability      Tobacco: quit smoking; smoked for two years.      Alcohol ;none      Drugs: none             PHYSICAL EXAM  Vitals:   10/19/15 1542  BP: (!) 110/56  Pulse: 84  Resp: 14  Weight: 175 lb (79.4 kg)  Height: 5\' 4"  (1.626 m)    Body mass index is 30.04 kg/m.   General: The patient is well-developed and well-nourished and in no acute distress  Musculoskeletal:  Neck is tender at occiput bilaterall  Neurologic Exam  Mental status: The patient is alert and oriented x 3 at the time of the examination. The patient has apparent normal recent and remote memory, with an apparently normal attention span and concentration ability.   Speech is normal.  Cranial nerves: Extraocular movements are full. Pupils are equal, round, and reactive to light and accomodation.  Visual fields are full.  Facial symmetry is present. There is good facial sensation to soft touch bilaterally.Facial strength is normal.  Trapezius and sternocleidomastoid strength is normal. No dysarthria is noted.  The tongue is midline, and the patient  has symmetric elevation of the soft palate. No obvious hearing deficits are noted.  Motor:  Muscle bulk is normal.   Tone is normal. Strength is  5 / 5 in all 4 extremities.   Sensory: Sensory testing is intact to pinprick, soft touch and vibration sensation in arms and she reports slightly reduced vibratory sensation in the left leg compared to the right..  Coordination: Cerebellar testing reveals good finger-nose-finger and heel-to-shin bilaterally.  Gait and station: Station is normal.   Gait is normal. Tandem gait is wide. Romberg is negative.   Reflexes: Deep tendon reflexes are symmetric and normal bilaterally.   Plantar responses are flexor.    DIAGNOSTIC DATA (LABS, IMAGING, TESTING) - I reviewed patient records, labs, notes, testing and imaging myself where available.  Lab Results  Component Value Date   WBC 12.5 (H) 10/11/2015   HGB 11.1 (L) 10/11/2015   HCT 34.1 (L) 10/11/2015   MCV 86.5 10/11/2015   PLT 230 10/11/2015      Component Value Date/Time   NA 137 10/11/2015 1859   NA 140 05/31/2015 1241   K 4.0 10/11/2015 1859   CL 108 10/11/2015 1859   CO2 21 (L) 10/11/2015 1859   GLUCOSE 114 (H) 10/11/2015 1859   BUN <5 (L) 10/11/2015 1859   BUN 6 05/31/2015 1241   CREATININE 0.55 10/11/2015 1859   CREATININE 0.60 05/05/2015 1051   CALCIUM 9.5 10/11/2015 1859   PROT 7.9 09/01/2015 2034   PROT 7.3 05/31/2015 1241   ALBUMIN 4.4 09/01/2015 2034   ALBUMIN 4.7 05/31/2015 1241   AST 20 09/01/2015 2034   ALT 21 09/01/2015 2034   ALKPHOS 42 09/01/2015 2034   BILITOT 0.7 09/01/2015 2034   BILITOT 0.3 05/31/2015 1241   GFRNONAA >60 10/11/2015 1859   GFRAA >60 10/11/2015 1859    Lab Results  Component Value Date   TSH 1.20 05/05/2015  ASSESSMENT AND PLAN  Chronic migraine  Pregnancy  Seizure disorder (HCC)  Schizoaffective disorder, bipolar type by history(HCC)  Neck pain    1.    Increase Keppra to 750 mg po bid. 2.    She is advised to try  to get ample sleep as sleep deprivation can also lower the seizure threshold 3.    Due to asthma not a good beta-blocker candidate. 4.    Bilateral splenius capitis muscle injections with 80 mg Depo-Medrol in 4 mL Marcaine using sterile technique. She tolerated the procedures well.   Pain was better afterwards She will return to see [name] in 4-5 months or call sooner if new or worsening neurologic symptoms.     Richard A. Felecia Shelling, MD, PhD [id number], [id number] PM Certified in Neurology, Clinical Neurophysiology, Sleep Medicine, Pain Medicine and Neuroimaging  [facility] [address] [place], [place] [address] [phone]

## 2015-10-19 NOTE — ED Triage Notes (Signed)
Pt reports headache that started last Tuesday. Pt also reports she is 19w pregnant. [facility] and given steroids without improvement.

## 2015-10-20 DIAGNOSIS — O9989 Other specified diseases and conditions complicating pregnancy, childbirth and the puerperium: Secondary | ICD-10-CM | POA: Diagnosis not present

## 2015-10-20 NOTE — Discharge Instructions (Signed)
Return here as needed.  Follow-up with your doctor, increase her fluid intake and rest as much as possible

## 2015-10-20 NOTE — ED Provider Notes (Signed)
[place] DEPT Provider Note   CSN: [id number] Arrival date & time: 10/19/15  1826     History   Chief Complaint Chief Complaint  Patient presents with  . Headache    HPI [name] is a 25 y.o. female.  HPI Patient presents to the emergency department with headache.  This been ongoing for the last few weeks.  Patient has a history of chronic migraines.  States the headaches get worse over the last few weeks.  Patient states that the condition better, but light does seem to make her headache worse. The patient denies chest pain, shortness of breath,,blurred vision, neck pain, fever, cough, weakness, numbness, dizziness, anorexia, edema, abdominal pain, nausea, vomiting, diarrhea, rash, back pain, dysuria, hematemesis, bloody stool, near syncope, or syncope. Past Medical History:  Diagnosis Date  . Acid reflux   . Asthma   . Autism   . 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 Chronic constipation   . Depression   . Drug-seeking behavior   . Essential tremor   . Personality disorder   . Schizo-affective psychosis ([place])   . Schizoaffective disorder, bipolar type ([place])   . Seizures [facility])     Patient Active Problem List   Diagnosis Date Noted  . Neck pain 10/19/2015  . 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  . [redacted] weeks gestation of pregnancy   . Rh negative, antepartum 08/10/2015  . Supervision of high risk pregnancy, antepartum 08/08/2015  . Seizure disorder ([place]) 08/08/2015  . Seizure disorder in pregnancy, antepartum ([place]) 08/08/2015  . Chronic migraine 07/27/2015  . Vitamin D deficiency 07/18/2015  . Pregnancy 07/10/2015  . Asthma 04/15/2015  . Convulsion ([place]) 04/12/2015  . Schizoaffective disorder, bipolar type by history(HCC) 03/10/2014  . Borderline personality disorder 03/10/2014  . PTSD (post-traumatic stress disorder) 03/10/2014    Past Surgical History:  Procedure Laterality Date  . MOUTH SURGERY      OB History    Gravida Para Term Preterm AB  Living   2       1     SAB TAB Ectopic Multiple Live Births   1               Home Medications    Prior to Admission medications   Medication Sig Start Date End Date Taking? Authorizing Provider  albuterol (PROVENTIL HFA;VENTOLIN HFA) 108 (90 Base) MCG/ACT inhaler Inhale 1-2 puffs into the lungs every 6 (six) hours as needed for wheezing or shortness of breath. Reported on 05/05/2015 07/20/15   Wendie Agreste, MD  calcium carbonate (TUMS - DOSED IN MG ELEMENTAL CALCIUM) 500 MG chewable tablet Chew 1 tablet by mouth daily.    Historical Provider, MD  cholecalciferol (VITAMIN D-400) 400 UNITS TABS tablet Take 800 Units by mouth daily.     Historical Provider, MD  diphenhydrAMINE (BENADRYL) 25 mg capsule Take 25 mg by mouth every 6 (six) hours as needed.    Historical Provider, MD  docusate sodium (COLACE) 100 MG capsule Take 200 mg by mouth daily.     Historical Provider, MD  doxylamine, Sleep, (UNISOM) 25 MG tablet Take 25 mg by mouth at bedtime as needed for sleep.    Historical Provider, MD  fluticasone-salmeterol (ADVAIR HFA) 115-21 MCG/ACT inhaler Inhale 2 puffs into the lungs 2 (two) times daily. 08/03/15   Timmothy Euler, MD  levETIRAcetam (KEPPRA) 750 MG tablet Take 1 tablet (750 mg total) by mouth 2 (two) times daily. 10/19/15   Richard August Saucer,  MD  Menthol, Topical Analgesic, (BENGAY EX) Apply 1 application topically daily as needed (pain).     Historical Provider, MD  OLANZapine zydis (ZYPREXA) 5 MG disintegrating tablet Take 1 tablet (5 mg total) by mouth 2 (two) times daily after a meal. Patient not taking: Reported on 10/19/2015 09/03/15   Patrecia Pour, NP  Prenatal Vit-Fe Fumarate-FA (PRENATAL COMPLETE) 14-0.4 MG TABS Take 1 tablet by mouth daily. 10/15/15   Keitha Butte, CNM  Pyridoxine HCl (B-6) 50 MG TABS Take 100 mg by mouth 2 (two) times daily.     Historical Provider, MD  risperiDONE (RISPERDAL) 2 MG tablet  10/11/15   Historical Provider, MD  senna (SENOKOT) 8.6 MG tablet  Take 2 tablets by mouth daily.    Historical Provider, MD    Family History Family History  Problem Relation Age of Onset  . Mental illness Father   . Asthma Father   . PDD Brother   . Seizures Brother     Social History Social History  Substance Use Topics  . Smoking status: Former Smoker    Packs/day: 0.00  . Smokeless tobacco: Never Used     Comment: Smoked for 2  years age 3-21  . Alcohol use 0.6 oz/week    1 Standard drinks or equivalent per week     Comment: "not with preg"     Allergies   Bee venom; Coconut flavor; Geodon [ziprasidone hcl]; Haloperidol and related; Lithium; Oxycodone; Seroquel [quetiapine fumarate]; Percocet [oxycodone-acetaminophen]; Prilosec [omeprazole]; Shellfish allergy; Tegretol [carbamazepine]; and Phenergan [promethazine hcl]   Review of Systems Review of Systems All other systems negative except as documented in the HPI. All pertinent positives and negatives as reviewed in the HPI.   Physical Exam Updated Vital Signs BP 120/68   Pulse 89   Temp 98.3 F (36.8 C) (Oral)   Resp 17   Ht 5\' 4"  (1.626 m)   Wt 79.4 kg   LMP 06/07/2015   SpO2 99%   BMI 30.04 kg/m   Physical Exam  Constitutional: She is oriented to person, place, and time. She appears well-developed and well-nourished. No distress.  HENT:  Head: Normocephalic and atraumatic.  Mouth/Throat: Oropharynx is clear and moist.  Eyes: Pupils are equal, round, and reactive to light.  Neck: Normal range of motion. Neck supple.  Cardiovascular: Normal rate, regular rhythm and normal heart sounds.  Exam reveals no gallop and no friction rub.   No murmur heard. Pulmonary/Chest: Effort normal and breath sounds normal. No respiratory distress. She has no wheezes.  Abdominal: Soft. Bowel sounds are normal. She exhibits no distension. There is no tenderness.  Neurological: She is alert and oriented to person, place, and time. She exhibits normal muscle tone. Coordination normal.    Skin: Skin is warm and dry. No rash noted. No erythema.  Psychiatric: She has a normal mood and affect. Her behavior is normal.  Nursing note and vitals reviewed.    ED Treatments / Results  Labs (all labs ordered are listed, but only abnormal results are displayed) Labs Reviewed - No data to display  EKG  EKG Interpretation None       Radiology No results found.  Procedures Procedures (including critical care time)  Medications Ordered in ED Medications  sodium chloride 0.9 % bolus 1,000 mL (1,000 mLs Intravenous New Bag/Given 10/19/15 2302)  diphenhydrAMINE (BENADRYL) injection 25 mg (25 mg Intravenous Given 10/19/15 2302)  acetaminophen (TYLENOL) tablet 1,000 mg (1,000 mg Oral Given 10/20/15 0013)  Initial Impression / Assessment and Plan / ED Course  I have reviewed the triage vital signs and the nursing notes.  Pertinent labs & imaging results that were available during my care of the patient were reviewed by me and considered in my medical decision making (see chart for details).  Clinical Course    Patient is given IV fluids along with Tylenol and Benadryl for her headache.  Advised patient that her headaches be worse during pregnancy.  Patient is advised follow-up with her primary doctor along with the neurologist told to return here as needed.  Patient agrees the plan and all questions Richard.  She does not have any neurological deficits on exam.  She does not have any signs of indicate intracranial bleeding at this point  Final Clinical Impressions(s) / ED Diagnoses   Final diagnoses:  None    New Prescriptions New Prescriptions   No medications on file     Dalia Heading, PA-C 10/20/15 0117    Virgel Manifold, MD 10/20/15 1406

## 2015-10-24 ENCOUNTER — Ambulatory Visit (INDEPENDENT_AMBULATORY_CARE_PROVIDER_SITE_OTHER): Payer: Medicare Other | Admitting: Obstetrics & Gynecology

## 2015-10-24 ENCOUNTER — Other Ambulatory Visit: Payer: Self-pay | Admitting: Obstetrics & Gynecology

## 2015-10-24 VITALS — BP 119/54 | HR 100 | Wt 173.5 lb

## 2015-10-24 DIAGNOSIS — E559 Vitamin D deficiency, unspecified: Secondary | ICD-10-CM

## 2015-10-24 DIAGNOSIS — O4402 Placenta previa specified as without hemorrhage, second trimester: Secondary | ICD-10-CM

## 2015-10-24 DIAGNOSIS — O4400 Placenta previa specified as without hemorrhage, unspecified trimester: Secondary | ICD-10-CM | POA: Insufficient documentation

## 2015-10-24 DIAGNOSIS — O4412 Placenta previa with hemorrhage, second trimester: Secondary | ICD-10-CM

## 2015-10-24 DIAGNOSIS — Z148 Genetic carrier of other disease: Secondary | ICD-10-CM

## 2015-10-24 DIAGNOSIS — O0992 Supervision of high risk pregnancy, unspecified, second trimester: Secondary | ICD-10-CM

## 2015-10-24 LAB — POCT URINALYSIS DIP (DEVICE)
BILIRUBIN URINE: NEGATIVE
GLUCOSE, UA: NEGATIVE mg/dL
Hgb urine dipstick: NEGATIVE
KETONES UR: NEGATIVE mg/dL
Leukocytes, UA: NEGATIVE
NITRITE: NEGATIVE
PROTEIN: NEGATIVE mg/dL
Specific Gravity, Urine: 1.015 (ref 1.005–1.030)
Urobilinogen, UA: 0.2 mg/dL (ref 0.0–1.0)
pH: 7 (ref 5.0–8.0)

## 2015-10-24 NOTE — Progress Notes (Signed)
   PRENATAL VISIT NOTE  Subjective:  [name] is a 25 y.o. G2P0010 at [redacted]w[redacted]d being seen today for ongoing prenatal care.  She is currently monitored for the following issues for this high-risk pregnancy and has Schizoaffective disorder, bipolar type by history(HCC); Borderline personality disorder; PTSD (post-traumatic stress disorder); Convulsion ([place]); Asthma; Vitamin D deficiency; Chronic migraine; Supervision of high risk pregnancy, antepartum; Seizure disorder ([place]); Seizure disorder in pregnancy, antepartum ([place]); Rh negative, antepartum; 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; Neck pain; and Placenta previa on her problem list.  Patient reports no complaints.  Contractions: Not present. Vag. Bleeding: None.  Movement: Present. Denies leaking of fluid.   The following portions of the patient's history were reviewed and updated as appropriate: allergies, current medications, past family history, past medical history, past social history, past surgical history and problem list. Problem list updated.  Objective:   Vitals:   10/24/15 1143  BP: (!) 119/54  Pulse: 100  Weight: 173 lb 8 oz (78.7 kg)    Fetal Status: Fetal Heart Rate (bpm): 156   Movement: Present     General:  Alert, oriented and cooperative. Patient is in no acute distress.  Skin: Skin is warm and dry. No rash noted.   Cardiovascular: Normal heart rate noted  Respiratory: Normal respiratory effort, no problems with respiration noted  Abdomen: Soft, gravid, appropriate for gestational age. Pain/Pressure: Absent     Pelvic:  Cervical exam deferred        Extremities: Normal range of motion.  Edema: None  Mental Status: Normal mood and affect. Normal behavior. Normal judgment and thought content.   Urinalysis:      Assessment and Plan:  Pregnancy: G2P0010 at [redacted]w[redacted]d  1. Placenta previa, second trimester -Vaginal rest until next [name]  2. 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 -genetic screening done (see note)  3. Vitamin D  deficiency - VITAMIN D 25 Hydroxy (Vit-D Deficiency, Fractures)  4. Supervision of high-risk pregnancy, second trimester - Alpha fetoprotein, maternal - Glucose tolerance, 1 hour (obesity)  5.  Seizure and Psych -No issues, controlled with meds -Monitored by neurologist and behavioral health.  Preterm labor symptoms and general obstetric precautions including but not limited to vaginal bleeding, contractions, leaking of fluid and fetal movement were reviewed in detail with the patient. Please refer to After Visit Summary for other counseling recommendations.  Return in about 4 weeks (around 11/21/2015).  Guss Bunde, MD

## 2015-10-24 NOTE — Progress Notes (Signed)
ROI filled out for notes from Neurologist  Declined flu vaccine

## 2015-10-25 ENCOUNTER — Encounter: Payer: Self-pay | Admitting: Certified Nurse Midwife

## 2015-10-25 LAB — ALPHA FETOPROTEIN, MATERNAL
AFP: 65.2 ng/mL
Curr Gest Age: 19.9 weeks
MOM FOR AFP: 1.26
OPEN SPINA BIFIDA: NEGATIVE
Osb Risk: 1:5720 {titer}

## 2015-10-25 LAB — VITAMIN D 25 HYDROXY (VIT D DEFICIENCY, FRACTURES): VIT D 25 HYDROXY: 33 ng/mL (ref 30–100)

## 2015-10-31 LAB — GLUCOSE TOLERANCE, 1 HOUR

## 2015-11-04 LAB — GLUCOSE TOLERANCE, 1 HOUR

## 2015-11-04 LAB — GLUCOSE TOLERANCE, 1 HOUR (50G) W/O FASTING: Glucose, 1 Hr, gestational: 105 mg/dL (ref <140)

## 2015-11-21 ENCOUNTER — Ambulatory Visit (INDEPENDENT_AMBULATORY_CARE_PROVIDER_SITE_OTHER): Payer: Medicare Other | Admitting: Family Medicine

## 2015-11-21 VITALS — BP 117/59 | HR 100 | Wt 174.2 lb

## 2015-11-21 DIAGNOSIS — Z148 Genetic carrier of other disease: Secondary | ICD-10-CM

## 2015-11-21 DIAGNOSIS — F250 Schizoaffective disorder, bipolar type: Secondary | ICD-10-CM

## 2015-11-21 DIAGNOSIS — O0990 Supervision of high risk pregnancy, unspecified, unspecified trimester: Secondary | ICD-10-CM

## 2015-11-21 DIAGNOSIS — F840 Autistic disorder: Secondary | ICD-10-CM

## 2015-11-21 DIAGNOSIS — O99352 Diseases of the nervous system complicating pregnancy, second trimester: Secondary | ICD-10-CM

## 2015-11-21 DIAGNOSIS — O99350 Diseases of the nervous system complicating pregnancy, unspecified trimester: Secondary | ICD-10-CM

## 2015-11-21 DIAGNOSIS — Z6791 Unspecified blood type, Rh negative: Secondary | ICD-10-CM

## 2015-11-21 DIAGNOSIS — F603 Borderline personality disorder: Secondary | ICD-10-CM

## 2015-11-21 DIAGNOSIS — O26899 Other specified pregnancy related conditions, unspecified trimester: Secondary | ICD-10-CM

## 2015-11-21 DIAGNOSIS — O4402 Placenta previa specified as without hemorrhage, second trimester: Secondary | ICD-10-CM

## 2015-11-21 DIAGNOSIS — G40909 Epilepsy, unspecified, not intractable, without status epilepticus: Secondary | ICD-10-CM

## 2015-11-21 DIAGNOSIS — F4310 Post-traumatic stress disorder, unspecified: Secondary | ICD-10-CM

## 2015-11-21 NOTE — Patient Instructions (Signed)
Second Trimester of Pregnancy The second trimester is from week 13 through week 28, month 4 through 6. This is often the time in pregnancy that you feel your best. Often times, morning sickness has lessened or quit. You may have more energy, and you may get hungry more often. Your unborn baby (fetus) is growing rapidly. At the end of the sixth month, he or she is about 9 inches long and weighs about 1 pounds. You will likely feel the baby move (quickening) between 18 and 20 weeks of pregnancy. HOME CARE   Avoid all smoking, herbs, and alcohol. Avoid drugs not approved by your doctor.  Do not use any tobacco products, including cigarettes, chewing tobacco, and electronic cigarettes. If you need help quitting, ask your doctor. You may get counseling or other support to help you quit.  Only take medicine as told by your doctor. Some medicines are safe and some are not during pregnancy.  Exercise only as told by your doctor. Stop exercising if you start having cramps.  Eat regular, healthy meals.  Wear a good support bra if your breasts are tender.  Do not use hot tubs, steam rooms, or saunas.  Wear your seat belt when driving.  Avoid raw meat, uncooked cheese, and liter boxes and soil used by cats.  Take your prenatal vitamins.  Take 1500-2000 milligrams of calcium daily starting at the 20th week of pregnancy until you deliver your baby.  Try taking medicine that helps you poop (stool softener) as needed, and if your doctor approves. Eat more fiber by eating fresh fruit, vegetables, and whole grains. Drink enough fluids to keep your pee (urine) clear or pale yellow.  Take warm water baths (sitz baths) to soothe pain or discomfort caused by hemorrhoids. Use hemorrhoid cream if your doctor approves.  If you have puffy, bulging veins (varicose veins), wear support hose. Raise (elevate) your feet for 15 minutes, 3-4 times a day. Limit salt in your diet.  Avoid heavy lifting, wear low heals,  and sit up straight.  Rest with your legs raised if you have leg cramps or low back pain.  Visit your dentist if you have not gone during your pregnancy. Use a soft toothbrush to brush your teeth. Be gentle when you floss.  You can have sex (intercourse) unless your doctor tells you not to.  Go to your doctor visits. GET HELP IF:   You feel dizzy.  You have mild cramps or pressure in your lower belly (abdomen).  You have a nagging pain in your belly area.  You continue to feel sick to your stomach (nauseous), throw up (vomit), or have watery poop (diarrhea).  You have bad smelling fluid coming from your vagina.  You have pain with peeing (urination). GET HELP RIGHT AWAY IF:   You have a fever.  You are leaking fluid from your vagina.  You have spotting or bleeding from your vagina.  You have severe belly cramping or pain.  You lose or gain weight rapidly.  You have trouble catching your breath and have chest pain.  You notice sudden or extreme puffiness (swelling) of your face, hands, ankles, feet, or legs.  You have not felt the baby move in over an hour.  You have severe headaches that do not go away with medicine.  You have vision changes.   This information is not intended to replace advice given to you by your health care provider. Make sure you discuss any questions you have with your  health care provider.   Document Released: 04/18/2009 Document Revised: 02/12/2014 Document Reviewed: 03/25/2012 Elsevier Interactive Patient Education 2016 Elsevier Inc[employer]   Placenta Previa Placenta previa is a condition in pregnant women where the placenta implants in the lower part of the uterus. The placenta either partially or completely covers the opening to the cervix. This is a problem because the baby must pass through the cervix during delivery. There are three types of placenta previa. They include:  1. Marginal placenta previa. The placenta is near the cervix, but  does not cover the opening. 2. Partial placenta previa. The placenta covers part of the cervical opening. 3. Complete placenta previa. The placenta covers the entire cervical opening.  Depending on the type of placenta previa, there is a chance the placenta may move into a normal position and no longer cover the cervix as the pregnancy progresses. It is important to keep all prenatal visits with your caregiver.  RISK FACTORS You may be more likely to develop placenta previa if you:   Are carrying more than one baby (multiples).   Have an abnormally shaped uterus.   Have scars on the lining of the uterus.   Had previous surgeries involving the uterus, such as a cesarean delivery.   Have delivered a baby previously.   Have a history of placenta previa.   Have smoked or used cocaine during pregnancy.   Are age 12 or older during pregnancy.  SYMPTOMS The main symptom of placenta previa is sudden, painless vaginal bleeding during the second half of pregnancy. The amount of bleeding can be light to very heavy. The bleeding may stop on its own, but almost always returns. Cramping, regular contractions, abdominal pain, and lower back pain can also occur with placenta previa.  DIAGNOSIS Placenta previa can be diagnosed through an ultrasound by finding where the placenta is located. The ultrasound may find placenta previa either during a routine prenatal visit or after vaginal bleeding is noticed. If you are diagnosed with placenta previa, your caregiver may avoid vaginal exams to reduce the risk of heavy bleeding. There is a chance that placenta previa may not be diagnosed until bleeding occurs during labor.  TREATMENT Specific treatment depends on:   How much you are bleeding or if the bleeding has stopped.  How far along you are in your pregnancy.   The condition of the baby.   The location of the baby and placenta.   The type of placenta previa.  Depending on the factors  above, your caregiver may recommend:   Decreased activity.   Bed rest at home or in the hospital.  Pelvic rest. This means no sex, using tampons, douching, pelvic exams, or placing anything into the vagina.  A blood transfusion to replace maternal blood loss.  A cesarean delivery if the bleeding is heavy and cannot be controlled or the placenta completely covers the cervix.  Medication to stop premature labor or mature the fetal lungs if delivery is needed before the pregnancy is full term.  WHEN SHOULD YOU SEEK IMMEDIATE MEDICAL CARE IF YOU ARE SENT HOME WITH PLACENTA PREVIA? Seek immediate medical care if you show any symptoms of placenta previa. You will need to go to the hospital to get checked immediately. Again, those symptoms are:  Sudden, painless vaginal bleeding, even a small amount.  Cramping or regular contractions.  Lower back or abdominal pain.   This information is not intended to replace advice given to you by your health care provider. Make  sure you discuss any questions you have with your health care provider.   Document Released: 01/22/2005 Document Revised: 02/12/2014 Document Reviewed: 04/25/2012 Elsevier Interactive Patient Education [employer].

## 2015-11-21 NOTE — Progress Notes (Signed)
Subjective:  [name] is a 25 y.o. G2P0010 at [redacted]w[redacted]d being seen today for ongoing prenatal care.  She is currently monitored for the following issues for this high-risk pregnancy and has Schizoaffective disorder, bipolar type by history(HCC); Borderline personality disorder; PTSD (post-traumatic stress disorder); Convulsion ([place]); Asthma; Vitamin D deficiency; Chronic migraine; Supervision of high risk pregnancy, antepartum; Seizure disorder ([place]); Seizure disorder in pregnancy, antepartum ([place]); Rh negative, antepartum; 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; Neck pain; and Placenta previa on her problem list.  Patient reports no complaints.  Contractions: Not present.  .  Movement: Present. Denies leaking of fluid. Patient present with husband today.  The following portions of the patient's history were reviewed and updated as appropriate: allergies, current medications, past family history, past medical history, past social history, past surgical history and problem list. Problem list updated.  Objective:   Vitals:   11/21/15 1253  BP: (!) 117/59  Pulse: 100  Weight: 174 lb 3.2 oz (79 kg)    Fetal Status: Fetal Heart Rate (bpm): 152   Movement: Present   Fundal Height: 24cm  General:  Alert, oriented and cooperative. Patient is in no acute distress.  Skin: Skin is warm and dry. No rash noted.   Cardiovascular: Normal heart rate noted  Respiratory: Normal respiratory effort, no problems with respiration noted  Abdomen: Soft, gravid, appropriate for gestational age. Pain/Pressure: Absent     Pelvic:  Cervical exam deferred      KNOWN PREVIA  Extremities: Normal range of motion.  Edema: None  Mental Status: Normal mood and affect. Normal behavior. Normal judgment and thought content.   Urinalysis:      Assessment and Plan:  Pregnancy: G2P0010 at [redacted]w[redacted]d  1. Supervision of high risk pregnancy, antepartum - 1-hr GTT NEXT visit - NIPS negative  2. Rh negative, antepartum - Rhogam at 28  weeks (next visit)  3. 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 - NIPS was low risk  4. Placenta previa in second trimester - [name] 10/19 @ 12:45PM to reassess and assess growth  5. Seizure disorder in pregnancy, antepartum ([place]) - No recent seizures, on Keppra  6. PTSD (post-traumatic stress disorder)  7. Borderline personality disorder  8. Schizoaffective disorder, bipolar type by history(HCC)  9. Autism spectrum disorder  Preterm labor symptoms and general obstetric precautions including but not limited to vaginal bleeding, contractions, leaking of fluid and fetal movement were reviewed in detail with the patient. Placental previa precautions discussed Please refer to After Visit Summary for other counseling recommendations.  Return in about 4 weeks (around 12/19/2015) for 1-hr GTT, Routine OB visit.   [place], [place]

## 2015-11-24 ENCOUNTER — Ambulatory Visit (HOSPITAL_COMMUNITY)
Admission: RE | Admit: 2015-11-24 | Discharge: 2015-11-24 | Disposition: A | Discharge status: Home / Self Care | Payer: Medicare Other | Source: Ambulatory Visit | Attending: Obstetrics and Gynecology | Admitting: Obstetrics and Gynecology

## 2015-11-24 DIAGNOSIS — O4402 Placenta previa specified as without hemorrhage, second trimester: Secondary | ICD-10-CM | POA: Insufficient documentation

## 2015-11-24 DIAGNOSIS — O99512 Diseases of the respiratory system complicating pregnancy, second trimester: Secondary | ICD-10-CM | POA: Insufficient documentation

## 2015-11-24 DIAGNOSIS — O99352 Diseases of the nervous system complicating pregnancy, second trimester: Secondary | ICD-10-CM | POA: Diagnosis not present

## 2015-11-24 DIAGNOSIS — O99351 Diseases of the nervous system complicating pregnancy, first trimester: Secondary | ICD-10-CM

## 2015-11-24 DIAGNOSIS — Z148 Genetic carrier of other disease: Secondary | ICD-10-CM | POA: Diagnosis not present

## 2015-11-24 DIAGNOSIS — O99342 Other mental disorders complicating pregnancy, second trimester: Secondary | ICD-10-CM | POA: Diagnosis not present

## 2015-11-24 DIAGNOSIS — G40909 Epilepsy, unspecified, not intractable, without status epilepticus: Secondary | ICD-10-CM | POA: Diagnosis not present

## 2015-11-24 DIAGNOSIS — Z362 Encounter for other antenatal screening follow-up: Secondary | ICD-10-CM | POA: Diagnosis not present

## 2015-11-24 DIAGNOSIS — J45909 Unspecified asthma, uncomplicated: Secondary | ICD-10-CM | POA: Insufficient documentation

## 2015-11-24 DIAGNOSIS — Z3A24 24 weeks gestation of pregnancy: Secondary | ICD-10-CM | POA: Diagnosis not present

## 2015-11-24 DIAGNOSIS — O26899 Other specified pregnancy related conditions, unspecified trimester: Secondary | ICD-10-CM

## 2015-11-24 DIAGNOSIS — Z6791 Unspecified blood type, Rh negative: Secondary | ICD-10-CM

## 2015-12-14 ENCOUNTER — Ambulatory Visit: Payer: Medicare Other | Admitting: Neurology

## 2015-12-15 ENCOUNTER — Encounter: Payer: Self-pay | Admitting: Neurology

## 2015-12-15 ENCOUNTER — Ambulatory Visit (INDEPENDENT_AMBULATORY_CARE_PROVIDER_SITE_OTHER): Payer: Medicare Other | Admitting: Neurology

## 2015-12-15 VITALS — BP 130/64 | HR 78 | Resp 16 | Ht 64.0 in | Wt 182.5 lb

## 2015-12-15 DIAGNOSIS — G43709 Chronic migraine without aura, not intractable, without status migrainosus: Secondary | ICD-10-CM | POA: Diagnosis not present

## 2015-12-15 DIAGNOSIS — F250 Schizoaffective disorder, bipolar type: Secondary | ICD-10-CM

## 2015-12-15 DIAGNOSIS — G40909 Epilepsy, unspecified, not intractable, without status epilepticus: Secondary | ICD-10-CM

## 2015-12-15 DIAGNOSIS — O99350 Diseases of the nervous system complicating pregnancy, unspecified trimester: Secondary | ICD-10-CM | POA: Diagnosis not present

## 2015-12-15 DIAGNOSIS — IMO0002 Reserved for concepts with insufficient information to code with codable children: Secondary | ICD-10-CM

## 2015-12-15 NOTE — Progress Notes (Signed)
[name]  PATIENT: [name] DOB: Mar 30, 1990  REFERRING DOCTOR OR PCP:   She sees [facility] for psychiatry (phone:    [phone]) SOURCE: Patient, husband, records in EMR  _________________________________   HISTORICAL  CHIEF COMPLAINT:  Chief Complaint  Patient presents with  . Migraines    Sts. h/a's have been less frequent, less severe--sts she has 3-4 mild h/a's per week.  Sts. she had 2 sz. back to back a few nights ago, lasting about 30 sec. each, per boyfriend. Sts. she increased Keppra to 750mg  bid as rx'd.  She denies missed doses. She is now 7 mos. pregnant--due 03-12-16/fim  . Seizure Disorder    HISTORY OF PRESENT ILLNESS:  [name] is a 25 year old woman with schizoaffective disorder who has had multiple spells of altered consciousness (+/- jerking) and frequent, migraine headaches.   She is now 7 months pregnant.   Her pregnancy is doing well.    Headache:  She reports a history of migraines x many years.  HA's used to be daily but now are only 3-4 times a week and milder,    She feels the Keppra has helped.   When present, pain is bifrontal and throbbing.       She denies  nausea o vomiting.   She has mild photophobia and phonophobia.  Moving usually worsens the pain.   She tolerates Keppra well.  Spells:   She had 2 more seizure like spells with jerking x 30 seconds followed by grogginess for a few minutes last week.   These were the first 2 since the [place] was increased.  Current dose is 750 mg by mouth twice a day. She tolerates that well.   Spells occurred while awake and while asleep.         Psych:   She sees Warden/ranger for [employer].   She has a complicated psychiatric history and has been diagnosed with Bipolar disease and with schizoaffective disease.   She is currently on Risperdal.   She was on  Abilify for schizoaffective disease and Prazosin for PTSD.  She also reports a lot of anxiety.      REVIEW OF  SYSTEMS: Constitutional: No fevers, chills, sweats, or change in appetite.   Poor sleep Eyes: No visual changes, double vision, eye pain Ear, nose and throat: No hearing loss, ear pain, nasal congestion, sore throat Cardiovascular: No chest pain, palpitations Respiratory: No shortness of breath at rest or with exertion.   No wheezes GastrointestinaI: No nausea, vomiting, diarrhea, abdominal pain, fecal incontinence Genitourinary: No dysuria, urinary retention or frequency.  No nocturia. Musculoskeletal: Some neck pain and back pain Integumentary: No rash, pruritus, skin lesions Neurological: as above Psychiatric: see above Endocrine: No palpitations, diaphoresis, change in appetite, change in weigh or increased thirst Hematologic/Lymphatic: No anemia, purpura, petechiae. Allergic/Immunologic: No itchy/runny eyes, nasal congestion, recent allergic reactions, rashes  ALLERGIES: Allergies  Allergen Reactions  . Bee Venom Anaphylaxis  . Coconut Flavor Anaphylaxis  . Geodon [Ziprasidone Hcl] Other (See Comments)    Paralysis of mouth  . Haloperidol And Related Other (See Comments)    Paralysis of mouth   . Lithium Other (See Comments)    Seizure like muscle spasms   . Oxycodone Other (See Comments)    hallucinations   . Seroquel [Quetiapine Fumarate] Other (See Comments)    TOO STRONG  . Percocet [Oxycodone-Acetaminophen] Other (See Comments)    Causes hallucinations.  . Prilosec [Omeprazole] Nausea And Vomiting  .  Shellfish Allergy Swelling  . Tegretol [Carbamazepine] Nausea And Vomiting  . Phenergan [Promethazine Hcl] Other (See Comments)    Chest pain      HOME MEDICATIONS:  Current Outpatient Prescriptions:  .  albuterol (PROVENTIL HFA;VENTOLIN HFA) 108 (90 Base) MCG/ACT inhaler, Inhale 1-2 puffs into the lungs every 6 (six) hours as needed for wheezing or shortness of breath. Reported on 05/05/2015, Disp: 1 Inhaler, Rfl: 1 .  calcium carbonate (TUMS - DOSED IN MG  ELEMENTAL CALCIUM) 500 MG chewable tablet, Chew 1 tablet by mouth daily., Disp: , Rfl:  .  cholecalciferol (VITAMIN D-400) 400 UNITS TABS tablet, Take 800 Units by mouth daily. , Disp: , Rfl:  .  diphenhydrAMINE (BENADRYL) 25 mg capsule, Take 25 mg by mouth every 6 (six) hours as needed., Disp: , Rfl:  .  docusate sodium (COLACE) 100 MG capsule, Take 200 mg by mouth daily. , Disp: , Rfl:  .  fluticasone-salmeterol (ADVAIR HFA) 115-21 MCG/ACT inhaler, Inhale 2 puffs into the lungs 2 (two) times daily., Disp: 1 Inhaler, Rfl: 5 .  levETIRAcetam (KEPPRA) 750 MG tablet, Take 1 tablet (750 mg total) by mouth 2 (two) times daily., Disp: 60 tablet, Rfl: 11 .  Menthol, Topical Analgesic, (BENGAY EX), Apply 1 application topically daily as needed (pain). , Disp: , Rfl:  .  Prenatal Vit-Fe Fumarate-FA (PRENATAL COMPLETE) 14-0.4 MG TABS, Take 1 tablet by mouth daily., Disp: 30 each, Rfl: 11 .  Pyridoxine HCl (B-6) 50 MG TABS, Take 100 mg by mouth 2 (two) times daily. , Disp: , Rfl:  .  ranitidine (ZANTAC) 150 MG tablet, Take 150 mg by mouth 2 (two) times daily., Disp: , Rfl:  .  risperiDONE (RISPERDAL) 2 MG tablet, , Disp: , Rfl: 0 .  senna (SENOKOT) 8.6 MG tablet, Take 2 tablets by mouth daily., Disp: , Rfl:   PAST MEDICAL HISTORY: Past Medical History:  Diagnosis Date  . Acid reflux   . Asthma   . Autism   . 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 Chronic constipation   . Depression   . Drug-seeking behavior   . Essential tremor   . Personality disorder   . Schizo-affective psychosis ([place])   . Schizoaffective disorder, bipolar type ([place])   . Seizures ([place])     PAST SURGICAL HISTORY: Past Surgical History:  Procedure Laterality Date  . MOUTH SURGERY      FAMILY HISTORY: Family History  Problem Relation Age of Onset  . Mental illness Father   . Asthma Father   . PDD Brother   . Seizures Brother     SOCIAL HISTORY:  Social History   Social History  . Marital status: Married    Spouse  name: N/A  . Number of children: 0  . Years of education: N/A   Occupational History  . disability    Social History Main Topics  . Smoking status: Former Smoker    Packs/day: 0.00  . Smokeless tobacco: Never Used     Comment: Smoked for 2  years age [age]  . Alcohol use 0.6 oz/week    1 Standard drinks or equivalent per week     Comment: "not with preg"  . Drug use: No     Comment: History of cocaine use at age 25 for 4 months  . Sexual activity: Yes    Birth control/ protection: None   Other Topics Concern  . Not on file   Social History Narrative   Marital status: married;  guardian is Furniture conservator/restorer      Children: none      Lives: with husband in apartment      Employment:  Disability      Tobacco: quit smoking; smoked for two years.      Alcohol ;none      Drugs: none             PHYSICAL EXAM  Vitals:   12/15/15 1356  BP: 130/64  Pulse: 78  Resp: 16  Weight: 182 lb 8 oz (82.8 kg)  Height: 5\' 4"  (1.626 m)    Body mass index is 31.33 kg/m.   General: The patient is well-developed and well-nourished and in no acute distress  Musculoskeletal:  Neck is nontender   Neurologic Exam  Mental status: The patient is alert and oriented x 3 at the time of the examination. The patient has apparent normal recent and remote memory, with an apparently normal attention span and concentration ability.   Speech is normal.  Cranial nerves: Extraocular movements are full.   Facial symmetry is present. There is good facial sensation to soft touch bilaterally.Facial strength is normal.  Trapezius and sternocleidomastoid strength is normal. No dysarthria is noted.  The tongue is midline, and the patient has symmetric elevation of the soft palate. No obvious hearing deficits are noted.  Motor:  Muscle bulk is normal.   Tone is normal. Strength is  5 / 5 in all 4 extremities.   Sensory: Sensory testing is intact to pinprick, soft touch and vibration sensation in arms and she  reports slightly reduced vibratory sensation in the left leg compared to the right..  Coordination: Cerebellar testing reveals good finger-nose-finger and heel-to-shin bilaterally.  Gait and station: Station is normal.   Gait is normal. Tandem gait is slightly wide. Romberg is negative.   Reflexes: Deep tendon reflexes are symmetric and normal bilaterally.        DIAGNOSTIC DATA (LABS, IMAGING, TESTING) - I reviewed patient records, labs, notes, testing and imaging myself where available.  Lab Results  Component Value Date   WBC 12.5 (H) 10/11/2015   HGB 11.1 (L) 10/11/2015   HCT 34.1 (L) 10/11/2015   MCV 86.5 10/11/2015   PLT 230 10/11/2015      Component Value Date/Time   NA 137 10/11/2015 1859   NA 140 05/31/2015 1241   K 4.0 10/11/2015 1859   CL 108 10/11/2015 1859   CO2 21 (L) 10/11/2015 1859   GLUCOSE 114 (H) 10/11/2015 1859   BUN <5 (L) 10/11/2015 1859   BUN 6 05/31/2015 1241   CREATININE 0.55 10/11/2015 1859   CREATININE 0.60 05/05/2015 1051   CALCIUM 9.5 10/11/2015 1859   PROT 7.9 09/01/2015 2034   PROT 7.3 05/31/2015 1241   ALBUMIN 4.4 09/01/2015 2034   ALBUMIN 4.7 05/31/2015 1241   AST 20 09/01/2015 2034   ALT 21 09/01/2015 2034   ALKPHOS 42 09/01/2015 2034   BILITOT 0.7 09/01/2015 2034   BILITOT 0.3 05/31/2015 1241   GFRNONAA >60 10/11/2015 1859   GFRAA >60 10/11/2015 1859    Lab Results  Component Value Date   TSH 1.20 05/05/2015       ASSESSMENT AND PLAN  Seizure disorder in pregnancy, antepartum ([place])  Schizoaffective disorder, bipolar type by history(HCC)  Chronic migraine    1.   Continue Keppra to 750 mg po bid.   If she has more spells, consider an increase in the dose. 2.    She is advised to  try to get ample sleep as sleep deprivation can also lower the seizure threshold 3.   She will return to see [name] in 5-6 months or call sooner if new or worsening neurologic symptoms.     Richard A. Felecia Shelling, MD, PhD [id number], [id number]  PM Certified in Neurology, Clinical Neurophysiology, Sleep Medicine, Pain Medicine and Neuroimaging  [facility] [address] [place], [place] [address] [phone]

## 2015-12-16 ENCOUNTER — Telehealth: Payer: Self-pay | Admitting: Neurology

## 2015-12-16 ENCOUNTER — Encounter (HOSPITAL_COMMUNITY): Payer: Self-pay

## 2015-12-16 ENCOUNTER — Emergency Department (HOSPITAL_COMMUNITY)
Admission: EM | Admit: 2015-12-16 | Discharge: 2015-12-16 | Disposition: A | Discharge status: Home / Self Care | Payer: Medicare Other | Attending: Emergency Medicine | Admitting: Emergency Medicine

## 2015-12-16 DIAGNOSIS — R569 Unspecified convulsions: Secondary | ICD-10-CM

## 2015-12-16 DIAGNOSIS — F840 Autistic disorder: Secondary | ICD-10-CM | POA: Insufficient documentation

## 2015-12-16 DIAGNOSIS — O99353 Diseases of the nervous system complicating pregnancy, third trimester: Secondary | ICD-10-CM | POA: Insufficient documentation

## 2015-12-16 DIAGNOSIS — Z79899 Other long term (current) drug therapy: Secondary | ICD-10-CM | POA: Diagnosis not present

## 2015-12-16 DIAGNOSIS — G40909 Epilepsy, unspecified, not intractable, without status epilepticus: Secondary | ICD-10-CM | POA: Diagnosis not present

## 2015-12-16 DIAGNOSIS — J45909 Unspecified asthma, uncomplicated: Secondary | ICD-10-CM | POA: Insufficient documentation

## 2015-12-16 DIAGNOSIS — Z87891 Personal history of nicotine dependence: Secondary | ICD-10-CM | POA: Diagnosis not present

## 2015-12-16 DIAGNOSIS — Z3493 Encounter for supervision of normal pregnancy, unspecified, third trimester: Secondary | ICD-10-CM

## 2015-12-16 DIAGNOSIS — Z3A28 28 weeks gestation of pregnancy: Secondary | ICD-10-CM | POA: Diagnosis not present

## 2015-12-16 LAB — COMPREHENSIVE METABOLIC PANEL
ALT: 18 U/L (ref 14–54)
ANION GAP: 7 (ref 5–15)
AST: 19 U/L (ref 15–41)
Albumin: 3 g/dL — ABNORMAL LOW (ref 3.5–5.0)
Alkaline Phosphatase: 39 U/L (ref 38–126)
BILIRUBIN TOTAL: 0.1 mg/dL — AB (ref 0.3–1.2)
BUN: <5 mg/dL — ABNORMAL LOW (ref 6–20)
CO2: 19 mmol/L — ABNORMAL LOW (ref 22–32)
Calcium: 9 mg/dL (ref 8.9–10.3)
Chloride: 109 mmol/L (ref 101–111)
Creatinine, Ser: 0.49 mg/dL (ref 0.44–1.00)
GFR calc Af Amer: >60 mL/min (ref >60)
Glucose, Bld: 108 mg/dL — ABNORMAL HIGH (ref 65–99)
POTASSIUM: 3.3 mmol/L — AB (ref 3.5–5.1)
Sodium: 135 mmol/L (ref 135–145)
TOTAL PROTEIN: 5.9 g/dL — AB (ref 6.5–8.1)

## 2015-12-16 LAB — CBC WITH DIFFERENTIAL/PLATELET
BASOS PCT: 0 %
Basophils Absolute: 0 10*3/uL (ref 0.0–0.1)
Eosinophils Absolute: 0 10*3/uL (ref 0.0–0.7)
Eosinophils Relative: 0 %
HEMATOCRIT: 32.8 % — AB (ref 36.0–46.0)
Hemoglobin: 10.6 g/dL — ABNORMAL LOW (ref 12.0–15.0)
LYMPHS PCT: 17 %
Lymphs Abs: 1.8 10*3/uL (ref 0.7–4.0)
MCH: 27.9 pg (ref 26.0–34.0)
MCHC: 32.3 g/dL (ref 30.0–36.0)
MCV: 86.3 fL (ref 78.0–100.0)
MONO ABS: 0.7 10*3/uL (ref 0.1–1.0)
MONOS PCT: 7 %
NEUTROS ABS: 8 10*3/uL — AB (ref 1.7–7.7)
Neutrophils Relative %: 76 %
Platelets: 188 10*3/uL (ref 150–400)
RBC: 3.8 MIL/uL — ABNORMAL LOW (ref 3.87–5.11)
RDW: 13.9 % (ref 11.5–15.5)
WBC: 10.4 10*3/uL (ref 4.0–10.5)

## 2015-12-16 LAB — MAGNESIUM: MAGNESIUM: 1.7 mg/dL (ref 1.7–2.4)

## 2015-12-16 MED ORDER — SODIUM CHLORIDE 0.9 % IV BOLUS (SEPSIS)
1000.0000 mL | Freq: Once | INTRAVENOUS | Status: AC
  Administered 2015-12-16: 1000 mL via INTRAVENOUS

## 2015-12-16 MED ORDER — SODIUM CHLORIDE 0.9 % IV SOLN
500.0000 mg | Freq: Once | INTRAVENOUS | Status: AC
  Administered 2015-12-16: 500 mg via INTRAVENOUS
  Filled 2015-12-16: qty 5

## 2015-12-16 MED ORDER — LEVETIRACETAM 500 MG PO TABS: 1000.0000 mg | ORAL_TABLET | Freq: Two times a day (BID) | ORAL | 0 refills | Status: DC

## 2015-12-16 NOTE — Telephone Encounter (Signed)
I was paged today. Patient stated she is well controlled on Keppra 750mg  twice daily with no seizures at her current baseline. Today patient stated she had 4 seizures with shaking, alteration of consciousness for 60 seconds each and confusion afterwards, and she is 7 months pregnant. When asked again, patient endorsed her baseline is  well controlled on Keppra 750mg  twice daily. 4 seizures in one day is very unusual per patient and husband (I spoke to both).  I advised if this is an acute increase in seizure frequency, and since she is 7 months pregnanct she should be evaluated in the emergency room.

## 2015-12-16 NOTE — ED Provider Notes (Signed)
[facility] DEPT Provider Note   CSN: [id number] Arrival date & time: 12/16/15  1925     History   Chief Complaint Chief Complaint  Patient presents with  . Seizures  . Routine Prenatal Visit    HPI [name] is a 25 y.o. female 7 months pregnant here presenting with seizure. Tonic-clonic seizure around 5:30 that lasted for less than 30 seconds. She then had a postictal period for about a minute or so. She had a similar episode around 6 PM and 6:30 PM and 7:30 PM. Had altogether 4 episodes today. She adamantly denies any abdominal pain or vaginal discharge or vaginal bleeding. Patient states that she has been followed with [name] for    her known seizure disorder. Her Keppra was increased to 750 mg twice a day from 500 mg wice a day in September due to increased seizures. She follows up with [facility] care. She took keppra 750 mg prior to arrival.   The history is provided by the patient.    Past Medical History:  Diagnosis Date  . Acid reflux   . Asthma   . Autism   . 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 Chronic constipation   . Depression   . Drug-seeking behavior   . Essential tremor   . Personality disorder   . Schizo-affective psychosis ([place])   . Schizoaffective disorder, bipolar type ([place])   . Seizures [facility])     Patient Active Problem List   Diagnosis Date Noted  . Placenta previa 10/24/2015  . Neck pain 10/19/2015  . 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  . Rh negative, antepartum 08/10/2015  . Supervision of high risk pregnancy, antepartum 08/08/2015  . Seizure disorder ([place]) 08/08/2015  . Seizure disorder in pregnancy, antepartum ([place]) 08/08/2015  . Chronic migraine 07/27/2015  . Vitamin D deficiency 07/18/2015  . Asthma 04/15/2015  . Convulsion ([place]) 04/12/2015  . Schizoaffective disorder, bipolar type by history(HCC) 03/10/2014  . Borderline personality disorder 03/10/2014  . PTSD (post-traumatic stress disorder)  03/10/2014  . Autism spectrum disorder 06/15/2013    Past Surgical History:  Procedure Laterality Date  . MOUTH SURGERY      OB History    Gravida Para Term Preterm AB Living   2       1     SAB TAB Ectopic Multiple Live Births   1               Home Medications    Prior to Admission medications   Medication Sig Start Date End Date Taking? Authorizing Provider  acetaminophen (TYLENOL) 500 MG chewable tablet Chew 1,000 mg by mouth every 6 (six) hours as needed for pain.   Yes Historical Provider, MD  albuterol (PROVENTIL HFA;VENTOLIN HFA) 108 (90 Base) MCG/ACT inhaler Inhale 1-2 puffs into the lungs every 6 (six) hours as needed for wheezing or shortness of breath. Reported on 05/05/2015 07/20/15  Yes Wendie Agreste, MD  calcium carbonate (TUMS - DOSED IN MG ELEMENTAL CALCIUM) 500 MG chewable tablet Chew 1 tablet by mouth daily.   Yes Historical Provider, MD  cholecalciferol (VITAMIN D-400) 400 UNITS TABS tablet Take 800 Units by mouth daily.    Yes Historical Provider, MD  diphenhydrAMINE (BENADRYL) 25 mg capsule Take 25 mg by mouth every 6 (six) hours as needed for sleep.    Yes Historical Provider, MD  docusate sodium (COLACE) 100 MG capsule Take 200 mg by mouth daily.    Yes  Historical Provider, MD  fluticasone-salmeterol (ADVAIR HFA) 115-21 MCG/ACT inhaler Inhale 2 puffs into the lungs 2 (two) times daily. 08/03/15  Yes Timmothy Euler, MD  levETIRAcetam (KEPPRA) 750 MG tablet Take 1 tablet (750 mg total) by mouth 2 (two) times daily. 10/19/15  Yes Britt Bottom, MD  Menthol, Topical Analgesic, (BENGAY EX) Apply 1 application topically daily as needed (pain).    Yes Historical Provider, MD  Prenatal Vit-Fe Fumarate-FA (PRENATAL COMPLETE) 14-0.4 MG TABS Take 1 tablet by mouth daily. 10/15/15  Yes Keitha Butte, CNM  Pyridoxine HCl (B-6) 50 MG TABS Take 100 mg by mouth 2 (two) times daily.    Yes Historical Provider, MD  ranitidine (ZANTAC) 150 MG tablet Take 150 mg by mouth 2  (two) times daily.   Yes Historical Provider, MD  risperiDONE (RISPERDAL) 2 MG tablet Take 2 mg by mouth 2 (two) times daily.  10/11/15  Yes Historical Provider, MD  senna (SENOKOT) 8.6 MG tablet Take 2 tablets by mouth daily.   Yes Historical Provider, MD    Family History Family History  Problem Relation Age of Onset  . Mental illness Father   . Asthma Father   . PDD Brother   . Seizures Brother     Social History Social History  Substance Use Topics  . Smoking status: Former Smoker    Packs/day: 0.00  . Smokeless tobacco: Never Used     Comment: Smoked for 2  years age [age]  . Alcohol use 0.6 oz/week    1 Standard drinks or equivalent per week     Comment: "not with preg"     Allergies   Bee venom; Coconut flavor; Geodon [ziprasidone hcl]; Haloperidol and related; Lithium; Oxycodone; Seroquel [quetiapine fumarate]; Percocet [oxycodone-acetaminophen]; Phenergan [promethazine hcl]; Prilosec [omeprazole]; Shellfish allergy; and Tegretol [carbamazepine]   Review of Systems Review of Systems  Neurological: Positive for seizures.  All other systems reviewed and are negative.    Physical Exam Updated Vital Signs BP 112/60   Pulse 91   Temp 98.3 F (36.8 C) (Oral)   Resp 15   LMP 06/07/2015   SpO2 99%   Physical Exam  Constitutional: She is oriented to person, place, and time. She appears well-developed and well-nourished.  Alert, oriented   HENT:  Head: Normocephalic.  Eyes: EOM are normal. Pupils are equal, round, and reactive to light.  Neck: Normal range of motion. Neck supple.  Cardiovascular: Normal rate, regular rhythm and normal heart sounds.   Pulmonary/Chest: Effort normal and breath sounds normal. No respiratory distress. She has no wheezes. She has no rales.  Abdominal: Soft. Bowel sounds are normal. She exhibits no distension.  Gravid uterus   Musculoskeletal: Normal range of motion.  Neurological: She is alert and oriented to person, place, and  time. She displays normal reflexes. No cranial nerve deficit. Coordination normal.  Skin: Skin is warm.  Psychiatric: She has a normal mood and affect.  Nursing note and vitals reviewed.    ED Treatments / Results  Labs (all labs ordered are listed, but only abnormal results are displayed) Labs Reviewed  CBC WITH DIFFERENTIAL/PLATELET - Abnormal; Notable for the following:       Result Value   RBC 3.80 (*)    Hemoglobin 10.6 (*)    HCT 32.8 (*)    Neutro Abs 8.0 (*)    All other components within normal limits  COMPREHENSIVE METABOLIC PANEL - Abnormal; Notable for the following:    Potassium 3.3 (*)  CO2 19 (*)    Glucose, Bld 108 (*)    BUN <5 (*)    Total Protein 5.9 (*)    Albumin 3.0 (*)    Total Bilirubin 0.1 (*)    All other components within normal limits  MAGNESIUM  URINALYSIS, ROUTINE W REFLEX MICROSCOPIC (NOT AT [facility])  CBG MONITORING, ED    EKG  EKG Interpretation None       Radiology No results found.  Procedures Procedures (including critical care time)  Medications Ordered in ED Medications  sodium chloride 0.9 % bolus 1,000 mL (0 mLs Intravenous Stopped 12/16/15 2246)  levETIRAcetam (KEPPRA) 500 mg in sodium chloride 0.9 % 100 mL IVPB (500 mg Intravenous New Bag/Given 12/16/15 2142)     Initial Impression / Assessment and Plan / ED Course  I have reviewed the triage vital signs and the nursing notes.  Pertinent labs & imaging results that were available during my care of the patient were reviewed by me and considered in my medical decision making (see chart for details).  Clinical Course    [name] is a 25 y.o. female here with seizures x 4 today. Known seizure disorder. BP nl, not concerned for ecclampsia. Will get labs, consult neuro.   10:58 PM Consulted Dr. Cheral Marker from neuro. He recommend checking Mg level, which was normal, increase keppra to 1000 mg BID. He considered eclampsia but LFTs nl and not hypertensive in the ED. She  was loaded with keppra IV in the ED. Told [facility]    Final Clinical Impressions(s) / ED Diagnoses   Final diagnoses:  None    New Prescriptions New Prescriptions   No medications on file     Drenda Freeze, MD 12/16/15 2300

## 2015-12-16 NOTE — ED Triage Notes (Signed)
Pt states seizures x 4 today. Pt states 7 months pregnant on Tuesday. Pt states hx of seizures. On Keppra 2x daily. Pt states shakes and goes unresponsive x 30s.

## 2015-12-16 NOTE — Discharge Instructions (Signed)
Switch keppra to 1000 mg twice daily.   See your neurologist at [facility] neuro for follow up.   Please see your OB doctor.   Return to ER if you have another seizure, vaginal bleeding, abdominal pain, back pain, contractions.

## 2015-12-18 NOTE — Telephone Encounter (Signed)
Please see if [name] is doing better ([name] called Jaynee Eagles that [name] had 4 seizures and [name] recommended [name] go to ER)  If ED did not make other changes, [name] should increase Keppra to tid

## 2015-12-19 ENCOUNTER — Other Ambulatory Visit (HOSPITAL_COMMUNITY): Payer: Self-pay | Admitting: Obstetrics and Gynecology

## 2015-12-19 ENCOUNTER — Ambulatory Visit (INDEPENDENT_AMBULATORY_CARE_PROVIDER_SITE_OTHER): Payer: Medicare Other | Admitting: Obstetrics and Gynecology

## 2015-12-19 ENCOUNTER — Ambulatory Visit (HOSPITAL_COMMUNITY): Payer: Medicare Other

## 2015-12-19 ENCOUNTER — Ambulatory Visit (HOSPITAL_COMMUNITY)
Admission: RE | Admit: 2015-12-19 | Discharge: 2015-12-19 | Disposition: A | Discharge status: Home / Self Care | Payer: Medicare Other | Source: Ambulatory Visit | Attending: Obstetrics and Gynecology | Admitting: Obstetrics and Gynecology

## 2015-12-19 VITALS — BP 129/65 | HR 113 | Wt 182.5 lb

## 2015-12-19 DIAGNOSIS — O99343 Other mental disorders complicating pregnancy, third trimester: Secondary | ICD-10-CM

## 2015-12-19 DIAGNOSIS — O0993 Supervision of high risk pregnancy, unspecified, third trimester: Secondary | ICD-10-CM

## 2015-12-19 DIAGNOSIS — G40909 Epilepsy, unspecified, not intractable, without status epilepticus: Secondary | ICD-10-CM

## 2015-12-19 DIAGNOSIS — O99352 Diseases of the nervous system complicating pregnancy, second trimester: Secondary | ICD-10-CM | POA: Insufficient documentation

## 2015-12-19 DIAGNOSIS — O99353 Diseases of the nervous system complicating pregnancy, third trimester: Secondary | ICD-10-CM

## 2015-12-19 DIAGNOSIS — O26899 Other specified pregnancy related conditions, unspecified trimester: Secondary | ICD-10-CM

## 2015-12-19 DIAGNOSIS — O99342 Other mental disorders complicating pregnancy, second trimester: Secondary | ICD-10-CM | POA: Diagnosis present

## 2015-12-19 DIAGNOSIS — Z3A28 28 weeks gestation of pregnancy: Secondary | ICD-10-CM

## 2015-12-19 DIAGNOSIS — Z148 Genetic carrier of other disease: Secondary | ICD-10-CM | POA: Diagnosis not present

## 2015-12-19 DIAGNOSIS — O99512 Diseases of the respiratory system complicating pregnancy, second trimester: Secondary | ICD-10-CM | POA: Insufficient documentation

## 2015-12-19 DIAGNOSIS — O99350 Diseases of the nervous system complicating pregnancy, unspecified trimester: Secondary | ICD-10-CM

## 2015-12-19 DIAGNOSIS — Z3A27 27 weeks gestation of pregnancy: Secondary | ICD-10-CM | POA: Insufficient documentation

## 2015-12-19 DIAGNOSIS — O360930 Maternal care for other rhesus isoimmunization, third trimester, not applicable or unspecified: Secondary | ICD-10-CM | POA: Diagnosis not present

## 2015-12-19 DIAGNOSIS — O0990 Supervision of high risk pregnancy, unspecified, unspecified trimester: Secondary | ICD-10-CM

## 2015-12-19 DIAGNOSIS — Z23 Encounter for immunization: Secondary | ICD-10-CM

## 2015-12-19 DIAGNOSIS — Z6791 Unspecified blood type, Rh negative: Secondary | ICD-10-CM

## 2015-12-19 DIAGNOSIS — F250 Schizoaffective disorder, bipolar type: Secondary | ICD-10-CM

## 2015-12-19 DIAGNOSIS — Z3A29 29 weeks gestation of pregnancy: Secondary | ICD-10-CM

## 2015-12-19 LAB — CBC
HCT: 32.5 % — ABNORMAL LOW (ref 35.0–45.0)
Hemoglobin: 10.6 g/dL — ABNORMAL LOW (ref 11.7–15.5)
MCH: 28 pg (ref 27.0–33.0)
MCHC: 32.6 g/dL (ref 32.0–36.0)
MCV: 86 fL (ref 80.0–100.0)
MPV: 9.8 fL (ref 7.5–12.5)
PLATELETS: 191 10*3/uL (ref 140–400)
RBC: 3.78 MIL/uL — ABNORMAL LOW (ref 3.80–5.10)
RDW: 13.8 % (ref 11.0–15.0)
WBC: 9.3 10*3/uL (ref 3.8–10.8)

## 2015-12-19 MED ORDER — RHO D IMMUNE GLOBULIN 1500 UNIT/2ML IJ SOSY
300.0000 ug | PREFILLED_SYRINGE | Freq: Once | INTRAMUSCULAR | Status: AC
  Administered 2015-12-19: 300 ug via INTRAMUSCULAR

## 2015-12-19 MED ORDER — RHO D IMMUNE GLOBULIN 1500 UNITS IM SOSY: 1500.0000 [IU] | PREFILLED_SYRINGE | Freq: Once | INTRAMUSCULAR | Status: DC

## 2015-12-19 MED ORDER — LEVETIRACETAM 750 MG PO TABS: 750.0000 mg | ORAL_TABLET | Freq: Three times a day (TID) | ORAL | 11 refills | Status: DC

## 2015-12-19 NOTE — Telephone Encounter (Signed)
I have spoken with [name] this morning.  She sts. she had 4 sz. on Friday, 1 on Sat.  Was seen in the ER and Keppra was increased to 1,000mg  bid, but she has not picked this rx. up and started it yet.  Per RAS, she should increase Keppra 750mg  to tid instead.  She already has the 750mg  tabs, so can start this now.  New rx. also escribed to [facility].  I also called [place] and advised they should disregard the rx. sent by the ER/fim

## 2015-12-19 NOTE — Addendum Note (Signed)
Addended by: France Ravens I on: 12/19/2015 09:57 AM   Modules accepted: Orders

## 2015-12-19 NOTE — Progress Notes (Signed)
Prenatal Visit Note Date: 12/19/2015 Clinic: [facility]  Subjective:  [name] is a 25 y.o. G2P0010 at [redacted]w[redacted]d being seen today for ongoing prenatal care.  She is currently monitored for the following issues for this high-risk pregnancy and has Autism spectrum disorder; Schizoaffective disorder, bipolar type by history(HCC); Borderline personality disorder; PTSD (post-traumatic stress disorder); Convulsion ([place]); Asthma; Chronic migraine; Supervision of high risk pregnancy, antepartum; Seizure disorder ([place]); Seizure disorder in pregnancy, antepartum ([place]); Rh negative, antepartum; and 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 on her problem list.  Patient reports no complaints.   Contractions: Not present. Vag. Bleeding: None.  Movement: Present. Denies leaking of fluid.   The following portions of the patient's history were reviewed and updated as appropriate: allergies, current medications, past family history, past medical history, past social history, past surgical history and problem list. Problem list updated.  Objective:   Vitals:   12/19/15 1030  BP: 129/65  Pulse: (!) 113  Weight: 182 lb 8 oz (82.8 kg)    Fetal Status: Fetal Heart Rate (bpm): 153   Movement: Present     General:  Alert, oriented and cooperative. Patient is in no acute distress.  Skin: Skin is warm and dry. No rash noted.   Cardiovascular: Normal heart rate noted  Respiratory: Normal respiratory effort, no problems with respiration noted  Abdomen: Soft, gravid, appropriate for gestational age. Pain/Pressure: Absent     Pelvic:  Cervical exam deferred        Extremities: Normal range of motion.  Edema: None  Mental Status: Normal mood and affect. Normal behavior. Normal judgment and thought content.   Urinalysis:      Assessment and Plan:  Pregnancy: G2P0010 at [redacted]w[redacted]d  1. Supervision of high risk pregnancy in third trimester Routine care. Pt not fasting today but we did switch recently to  fasting test so will do 2hr GTT next visit. Pt told about need for fast but okay to take meds with sips of water - HIV antibody (with reflex) - CBC - RPR  2. Rh negative state in antepartum period today - Rho D Immune Globulin SOSY 1,500 Units; Inject 1,500 Units into the muscle once. - Antibody screen; Future  3. Seizure disorder in pregnancy, antepartum [facility]) Patient had seizure and went to ER on 11/10 and was instructed to increase keppra dose and had another seizure yesterday; pt states she is increasing her keppra dose starting today.  She denies any belly trauma, falling or VB, cramping, decreased fetal movement s/s. I told her that we have to keep a close eye on her seizure, along with neurology who she sees on 11/16. I told her that if she has another seizure to come to [facility] for fetal testing to make sure she's doing well from [facility] standpoint. Also, given the fact that her seizures are becoming uncontrolled while previously controlled, I told her that we'll do antepartum testing with q4w growth u/s and do qwk BPPs from 28-32wks with MFM and then 2x/week testing here in our clinic. Pt for growth u/s and BPP today. Also, can talk to patient closer to [facility], but likely would benefit with consideration for IOL at 39wks as d/w MFM on 8/1.   4. Schizoaffective disorder, bipolar type by history(HCC) No issues on no meds. Can d/w pt at next visit re: who she is/was seeing for this  5. Asthma Pt states on no meds and not needing PRN meds. Can follow up at subsequent prenatal visits  Preterm labor  symptoms and general obstetric precautions including but not limited to vaginal bleeding, contractions, leaking of fluid and fetal movement were reviewed in detail with the patient. Please refer to After Visit Summary for other counseling recommendations.  7-10d RTC for ROB and 2hr GTT   Aletha Halim, MD

## 2015-12-19 NOTE — Addendum Note (Signed)
Addended by: Riccardo Dubin on: 12/19/2015 11:11 AM   Modules accepted: Orders

## 2015-12-19 NOTE — Addendum Note (Signed)
Addended by: Riccardo Dubin on: 12/19/2015 12:07 PM   Modules accepted: Orders

## 2015-12-19 NOTE — Addendum Note (Signed)
Addended by: Novella Olive on: 12/19/2015 11:03 AM   Modules accepted: Orders

## 2015-12-20 LAB — HIV ANTIBODY (ROUTINE TESTING W REFLEX): HIV 1&2 Ab, 4th Generation: NONREACTIVE

## 2015-12-20 LAB — ANTIBODY SCREEN: Antibody Screen: NEGATIVE

## 2015-12-20 LAB — RPR

## 2015-12-21 ENCOUNTER — Encounter (HOSPITAL_COMMUNITY): Payer: Self-pay | Admitting: *Deleted

## 2015-12-21 ENCOUNTER — Inpatient Hospital Stay (HOSPITAL_COMMUNITY)
Admission: AD | Admit: 2015-12-21 | Discharge: 2015-12-21 | Disposition: A | Discharge status: Home / Self Care | Payer: Medicare Other | Source: Ambulatory Visit | Attending: Obstetrics and Gynecology | Admitting: Obstetrics and Gynecology

## 2015-12-21 DIAGNOSIS — Z3A28 28 weeks gestation of pregnancy: Secondary | ICD-10-CM | POA: Insufficient documentation

## 2015-12-21 DIAGNOSIS — R569 Unspecified convulsions: Secondary | ICD-10-CM

## 2015-12-21 DIAGNOSIS — O99353 Diseases of the nervous system complicating pregnancy, third trimester: Secondary | ICD-10-CM | POA: Insufficient documentation

## 2015-12-21 DIAGNOSIS — Z87891 Personal history of nicotine dependence: Secondary | ICD-10-CM | POA: Insufficient documentation

## 2015-12-21 DIAGNOSIS — Z148 Genetic carrier of other disease: Secondary | ICD-10-CM

## 2015-12-21 DIAGNOSIS — O26899 Other specified pregnancy related conditions, unspecified trimester: Secondary | ICD-10-CM

## 2015-12-21 DIAGNOSIS — Z6791 Unspecified blood type, Rh negative: Secondary | ICD-10-CM

## 2015-12-21 NOTE — MAU Note (Signed)
Pt presents stating she had a seizure about 20 minutes ago that lasted for 30 seconds. States she has taken 750mg  of Keppra 3 times today. Increased dose this week. Had a seizure on Sunday too. Denies leaking or bleeding. Denies pain. Reports good fetal movement.

## 2015-12-21 NOTE — MAU Provider Note (Signed)
History     CSN: [id number]  Arrival date and time: 12/21/15 [id number]   First Provider Initiated Contact with Patient 12/21/15 1929      Chief Complaint  Patient presents with  . Seizures   HPI [name] is a 25 y.o. G2P0010 at [redacted]w[redacted]d who presents to MAU today with complaint of recent seizure. The patient started having seizures after a concussion from a fall in March. She is currently on Keppra and managed by Neurology. She was seen by OB on Monday and told to come in to MAU if she had another seizure. She states that she had a seizure this evening while laying in bed. It was witnessed by her significant other who confirms no fall or injury. The patient denies abdominal pain, contractions, vaginal bleeding or LOF. She reports good fetal movement.    OB History    Gravida Para Term Preterm AB Living   2       1     SAB TAB Ectopic Multiple Live Births   1              Past Medical History:  Diagnosis Date  . Acid reflux   . Asthma   . Autism   . 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 Chronic constipation   . Depression   . Drug-seeking behavior   . Essential tremor   . Personality disorder   . Schizo-affective psychosis ([place])   . Schizoaffective disorder, bipolar type ([place])   . Seizures ([place])     Past Surgical History:  Procedure Laterality Date  . MOUTH SURGERY      Family History  Problem Relation Age of Onset  . Mental illness Father   . Asthma Father   . PDD Brother   . Seizures Brother     Social History  Substance Use Topics  . Smoking status: Former Smoker    Packs/day: 0.00  . Smokeless tobacco: Never Used     Comment: Smoked for 2  years age [age]  . Alcohol use 0.6 oz/week    1 Standard drinks or equivalent per week     Comment: "not with preg"    Allergies:  Allergies  Allergen Reactions  . Bee Venom Anaphylaxis  . Coconut Flavor Anaphylaxis  . Geodon [Ziprasidone Hcl] Other (See Comments)    Pt states that this medication causes  paralysis of the mouth.    . Haloperidol And Related Other (See Comments)    Pt states that this medication causes paralysis of the mouth.    . Lithium Other (See Comments)    Reaction:  Seizure-like activity.    . Oxycodone Other (See Comments)    Reaction:  Hallucinations   . Seroquel [Quetiapine Fumarate] Other (See Comments)    Pt states that this medication is too strong.   . Shellfish Allergy Anaphylaxis  . Percocet [Oxycodone-Acetaminophen] Other (See Comments)    Reaction:  Hallucinations   . Phenergan [Promethazine Hcl] Other (See Comments)    Reaction:  Chest pain    . Prilosec [Omeprazole] Nausea And Vomiting  . Tegretol [Carbamazepine] Nausea And Vomiting    Prescriptions Prior to Admission  Medication Sig Dispense Refill Last Dose  . acetaminophen (TYLENOL) 500 MG chewable tablet Chew 1,000 mg by mouth every 6 (six) hours as needed for pain.   Taking  . albuterol (PROVENTIL HFA;VENTOLIN HFA) 108 (90 Base) MCG/ACT inhaler Inhale 1-2 puffs into the lungs every 6 (six) hours as needed for  wheezing or shortness of breath. Reported on 05/05/2015 (Patient not taking: Reported on 12/19/2015) 1 Inhaler 1 Not Taking  . calcium carbonate (TUMS - DOSED IN MG ELEMENTAL CALCIUM) 500 MG chewable tablet Chew 1 tablet by mouth daily.   Taking  . cholecalciferol (VITAMIN D-400) 400 UNITS TABS tablet Take 800 Units by mouth daily.    Not Taking  . diphenhydrAMINE (BENADRYL) 25 mg capsule Take 25 mg by mouth every 6 (six) hours as needed for sleep.    Taking  . docusate sodium (COLACE) 100 MG capsule Take 200 mg by mouth daily.    Taking  . fluticasone-salmeterol (ADVAIR HFA) 115-21 MCG/ACT inhaler Inhale 2 puffs into the lungs 2 (two) times daily. (Patient not taking: Reported on 12/19/2015) 1 Inhaler 5 Not Taking  . levETIRAcetam (KEPPRA) 750 MG tablet Take 1 tablet (750 mg total) by mouth 3 (three) times daily. 90 tablet 11 Taking  . Menthol, Topical Analgesic, (BENGAY EX) Apply 1  application topically daily as needed (pain).    Taking  . Prenatal Vit-Fe Fumarate-FA (PRENATAL COMPLETE) 14-0.4 MG TABS Take 1 tablet by mouth daily. 30 each 11 Taking  . Pyridoxine HCl (B-6) 50 MG TABS Take 100 mg by mouth 2 (two) times daily.    Not Taking  . ranitidine (ZANTAC) 150 MG tablet Take 150 mg by mouth 2 (two) times daily.   Taking  . risperiDONE (RISPERDAL) 2 MG tablet Take 2 mg by mouth 2 (two) times daily.   0 Not Taking  . senna (SENOKOT) 8.6 MG tablet Take 2 tablets by mouth daily.   Taking    Review of Systems  Gastrointestinal: Negative for abdominal pain.  Genitourinary:       Neg - vaginal bleeding, discharge, LOF  Neurological: Positive for seizures. Negative for dizziness and headaches.   Physical Exam   Blood pressure 142/77, pulse 117, temperature 98 F (36.7 C), temperature source Oral, resp. rate 18, last menstrual period 06/07/2015.  Physical Exam  Nursing note and vitals reviewed. Constitutional: She is oriented to person, place, and time. She appears well-developed and well-nourished. No distress.  HENT:  Head: Normocephalic and atraumatic.  Cardiovascular: Tachycardia present.   Respiratory: Effort normal.  GI: Soft. She exhibits no distension.  Neurological: She is alert and oriented to person, place, and time.  Skin: Skin is warm and dry. No erythema.  Psychiatric: She has a normal mood and affect.    Fetal Monitoring: Baseline: 150 bpm Variability: moderate Accelerations: 10 x 10 Decelerations: none Contractions: mild UI  MAU Course  Procedures None  MDM Discussed with Dr. Rip Harbour. Patient may be discharge to follow-up with Neurology as scheduled tomorrow for further management.   Assessment and Plan  A: [facility] at [redacted]w[redacted]d  P: Discharge home Patient advised to continue medication as prescribed at least until Neurology appointment tomorrow Warning signs for worsening condition discussed Patient advised to follow-up with [facility] as  scheduled for routine prenatal care and Neurology as scheduled tomorrow Patient may return to MAU as needed or if her condition were to change or worsen   Luvenia Redden, PA-C  12/21/2015, 7:29 PM

## 2015-12-21 NOTE — Discharge Instructions (Signed)
Seizure, Adult When you have a seizure:  Parts of your body may move.  How aware or awake (conscious) you are may change.  You may shake (convulse). Some people have symptoms right before a seizure happens. These symptoms may include:  Fear.  Worry (anxiety).  Feeling like you are going to throw up (nausea).  Feeling like the room is spinning (vertigo).  Feeling like you saw or heard something before (deja vu).  Odd tastes or smells.  Changes in vision, such as seeing flashing lights or spots. Seizures usually last from 30 seconds to 2 minutes. Usually, they are not harmful unless they last a long time. Follow these instructions at home: Medicines  Take over-the-counter and prescription medicines only as told by your doctor.  Avoid anything that may keep your medicine from working, such as alcohol. Activity  Do not do any activities that would be dangerous if you had another seizure, like driving or swimming. Wait until your doctor approves.  If you live in the [place] ask your local DMV (department of motor vehicles) when you can drive.  Rest. Teaching others  Teach friends and family what to do when you have a seizure. They should:  Lay you on the ground.  Protect your head and body.  Loosen any tight clothing around your neck.  Turn you on your side.  Stay with you until you are better.  Not hold you down.  Not put anything in your mouth.  Know whether or not you need emergency care. General instructions  Contact your doctor each time you have a seizure.  Avoid anything that gives you seizures.  Keep a seizure diary. Write down:  What you think caused each seizure.  What you remember about each seizure.  Keep all follow-up visits as told by your doctor. This is important. Contact a doctor if:  You have another seizure.  You have seizures more often.  There is any change in what happens during your seizures.  You continue to have seizures  with treatment.  You have symptoms of being sick or having an infection. Get help right away if:  You have a seizure:  That lasts longer than 5 minutes.  That is different than seizures you had before.  That makes it harder to breathe.  After you hurt your head.  After a seizure, you cannot speak or use a part of your body.  After a seizure, you are confused or have a bad headache.  You have two or more seizures in a row.  You are having seizures more often.  You do not wake up right after a seizure.  You get hurt during a seizure. In an emergency:  These symptoms may be an emergency. Do not wait to see if the symptoms will go away. Get medical help right away. Call your local emergency services (911 in the [place] Do not drive yourself to the hospital. This information is not intended to replace advice given to you by your health care provider. Make sure you discuss any questions you have with your health care provider. Document Released: 07/11/2007 Document Revised: 10/05/2015 Document Reviewed: 10/05/2015 Elsevier Interactive Patient Education  2017 [place] I Need to Know About Injuries During Pregnancy? Injuries can happen during pregnancy. Minor falls and accidents usually do not harm you or your baby. However, any injury should be reported to your doctor. What can I do to protect myself from injuries?  Remove rugs and loose objects on the  floor.  Wear comfortable shoes that have a good grip. Do not wear high-heeled shoes.  Always wear your seat belt. The lap belt should be below your belly. Always practice safe driving.  Do not ride on a motorcycle.  Do not participate in high-impact activities or sports.  Avoid:  Walking on wet or slippery floors.  Fires.  Starting fires.  Lifting heavy pots of boiling or hot liquids.  Fixing electrical problems.  Only take medicine as told by your doctor.  Know your blood type and the blood type of the  baby's father.  Call your local emergency services (911 in the [place] if you are a victim of domestic violence or assault. For help and support, contact the [employer]. Get help right away if:  You fall on your belly or have any high-impact accident or injury.  You have been a victim of domestic violence or any kind of violence.  You have been in a car accident.  You have bleeding from your vagina.  Fluid is leaking from your vagina.  You start to have belly cramping (contractions) or pain.  You feel weak or pass out (faint).  You start to throw up (vomit) after an injury.  You have been burned.  You have a stiff neck or neck pain.  You get a headache or have vision problems after an injury.  You do not feel the baby move or the baby is not moving as much as normal. This information is not intended to replace advice given to you by your health care provider. Make sure you discuss any questions you have with your health care provider. Document Released: 02/24/2010 Document Revised: 06/30/2015 Document Reviewed: 10/29/2012 Elsevier Interactive Patient Education  2017 [employer].

## 2015-12-22 ENCOUNTER — Ambulatory Visit (INDEPENDENT_AMBULATORY_CARE_PROVIDER_SITE_OTHER): Payer: Medicare Other | Admitting: Neurology

## 2015-12-22 ENCOUNTER — Encounter: Payer: Self-pay | Admitting: Neurology

## 2015-12-22 VITALS — BP 114/56 | HR 108 | Resp 18 | Ht 62.0 in | Wt 182.0 lb

## 2015-12-22 DIAGNOSIS — G40909 Epilepsy, unspecified, not intractable, without status epilepticus: Secondary | ICD-10-CM | POA: Diagnosis not present

## 2015-12-22 DIAGNOSIS — IMO0002 Reserved for concepts with insufficient information to code with codable children: Secondary | ICD-10-CM

## 2015-12-22 DIAGNOSIS — G43709 Chronic migraine without aura, not intractable, without status migrainosus: Secondary | ICD-10-CM

## 2015-12-22 DIAGNOSIS — F250 Schizoaffective disorder, bipolar type: Secondary | ICD-10-CM | POA: Diagnosis not present

## 2015-12-22 DIAGNOSIS — O99350 Diseases of the nervous system complicating pregnancy, unspecified trimester: Secondary | ICD-10-CM | POA: Diagnosis not present

## 2015-12-22 NOTE — Progress Notes (Signed)
y    [name]  PATIENT: [name] DOB: [birth year]  REFERRING DOCTOR OR PCP:   She sees [facility] for psychiatry (phone:    [phone]) SOURCE: Patient, husband, records in EMR  _________________________________   HISTORICAL  CHIEF COMPLAINT:  Chief Complaint  Patient presents with  . Multiple Sclerosis    Sts. she had 5 sz. over the weekend.  Was seen in the ER.  Per [facility], Keppra was increased to 750mg  po tid, but pt. just started the increased dose yesterday.  Had another sz. last night and was again seen in the ER/fim    HISTORY OF PRESENT ILLNESS:  [name] is a 25 year old woman with schizoaffective disorder who has had multiple spells of altered consciousness (+/- jerking) and frequent, migraine headaches.   She is now 7 months pregnant.  She is due 03/13/15.   Her pregnancy is doing well.    Seizures:   She had 4 short seizures with possible LOC 12/16/15 and she went to the ED.   Her dose of Keppra was increased to 750 mg by mouth 3 times a day. She had 2 more spells yesterday.   She started taking 3 x 750 mg 2 days ago and is tolerating the higher dose well.   She has had these for many years and has done best on Keppra. They can occur or she is awake or asleep.  Headache:  She reports a history of migraines x many years. These have done well since starting Keppra and they're no longer daily. She still gets a couple of weeks with a milder than they used to be.  When present, pain is bifrontal and throbbing.       She denies  nausea o vomiting.   She has mild photophobia and phonophobia.  Moving usually worsens the pain.     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   She has a complicated psychiatric history and has been diagnosed with Bipolar disease and with schizoaffective disease.   She is currently on Risperdal.   She was on  Abilify for schizoaffective disease and Prazosin for PTSD.  She also reports a lot of anxiety.      REVIEW OF  SYSTEMS: Constitutional: No fevers, chills, sweats, or change in appetite.   Poor sleep Eyes: No visual changes, double vision, eye pain Ear, nose and throat: No hearing loss, ear pain, nasal congestion, sore throat Cardiovascular: No chest pain, palpitations Respiratory: No shortness of breath at rest or with exertion.   No wheezes GastrointestinaI: No nausea, vomiting, diarrhea, abdominal pain, fecal incontinence Genitourinary: No dysuria, urinary retention or frequency.  No nocturia. Musculoskeletal: Some neck pain and back pain Integumentary: No rash, pruritus, skin lesions Neurological: as above Psychiatric: see above Endocrine: No palpitations, diaphoresis, change in appetite, change in weigh or increased thirst Hematologic/Lymphatic: No anemia, purpura, petechiae. Allergic/Immunologic: No itchy/runny eyes, nasal congestion, recent allergic reactions, rashes  ALLERGIES: Allergies  Allergen Reactions  . Bee Venom Anaphylaxis  . Coconut Flavor Anaphylaxis  . Geodon [Ziprasidone Hcl] Other (See Comments)    Pt states that this medication causes paralysis of the mouth.    . Haloperidol And Related Other (See Comments)    Pt states that this medication causes paralysis of the mouth.    . Lithium Other (See Comments)    Reaction:  Seizure-like activity.    . Oxycodone Other (See Comments)    Reaction:  Hallucinations   . Seroquel [Quetiapine Fumarate] Other (  See Comments)    Pt states that this medication is too strong.   . Shellfish Allergy Anaphylaxis  . Percocet [Oxycodone-Acetaminophen] Other (See Comments)    Reaction:  Hallucinations   . Phenergan [Promethazine Hcl] Other (See Comments)    Reaction:  Chest pain    . Prilosec [Omeprazole] Nausea And Vomiting  . Tegretol [Carbamazepine] Nausea And Vomiting    HOME MEDICATIONS:  Current Outpatient Prescriptions:  .  acetaminophen (TYLENOL) 500 MG tablet, Take 1,000 mg by mouth every 6 (six) hours as needed for mild  pain, moderate pain or headache., Disp: , Rfl:  .  albuterol (PROVENTIL HFA;VENTOLIN HFA) 108 (90 Base) MCG/ACT inhaler, Inhale 1-2 puffs into the lungs every 6 (six) hours as needed for wheezing or shortness of breath., Disp: , Rfl:  .  calcium carbonate (TUMS - DOSED IN MG ELEMENTAL CALCIUM) 500 MG chewable tablet, Chew 2 tablets by mouth as needed for indigestion or heartburn. , Disp: , Rfl:  .  diphenhydrAMINE (BENADRYL) 25 mg capsule, Take 50 mg by mouth at bedtime as needed for sleep. , Disp: , Rfl:  .  docusate sodium (COLACE) 100 MG capsule, Take 200 mg by mouth daily. , Disp: , Rfl:  .  Ergocalciferol (VITAMIN D2) 400 units TABS, Take 800 Units by mouth daily., Disp: , Rfl:  .  fluticasone-salmeterol (ADVAIR HFA) 115-21 MCG/ACT inhaler, Inhale 2 puffs into the lungs 2 (two) times daily., Disp: 1 Inhaler, Rfl: 5 .  levETIRAcetam (KEPPRA) 750 MG tablet, Take 1 tablet (750 mg total) by mouth 3 (three) times daily., Disp: 90 tablet, Rfl: 11 .  Menthol, Topical Analgesic, (BENGAY EX), Apply 1 application topically as needed (for pain). , Disp: , Rfl:  .  Prenatal Vit-Fe Fumarate-FA (PRENATAL COMPLETE) 14-0.4 MG TABS, Take 1 tablet by mouth at bedtime., Disp: , Rfl:  .  pyridOXINE (VITAMIN B-6) 100 MG tablet, Take 200 mg by mouth 2 (two) times daily., Disp: , Rfl:  .  ranitidine (ZANTAC) 150 MG tablet, Take 150 mg by mouth 2 (two) times daily., Disp: , Rfl:  .  risperiDONE (RISPERDAL) 2 MG tablet, Take 2 mg by mouth 2 (two) times daily. , Disp: , Rfl: 0 .  senna (SENOKOT) 8.6 MG tablet, Take 2 tablets by mouth at bedtime. , Disp: , Rfl:   PAST MEDICAL HISTORY: Past Medical History:  Diagnosis Date  . Acid reflux   . Asthma   . Autism   . 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 Chronic constipation   . Depression   . Drug-seeking behavior   . Essential tremor   . Personality disorder   . Schizo-affective psychosis ([facility])   . Schizoaffective disorder, bipolar type ([facility])   . Seizures ([facility])      PAST SURGICAL HISTORY: Past Surgical History:  Procedure Laterality Date  . MOUTH SURGERY      FAMILY HISTORY: Family History  Problem Relation Age of Onset  . Mental illness Father   . Asthma Father   . PDD Brother   . Seizures Brother     SOCIAL HISTORY:  Social History   Social History  . Marital status: Married    Spouse name: N/A  . Number of children: 0  . Years of education: N/A   Occupational History  . disability    Social History Main Topics  . Smoking status: Former Smoker    Packs/day: 0.00  . Smokeless tobacco: Never Used     Comment: Smoked for 2  years age [age]  . Alcohol use 0.6 oz/week    1 Standard drinks or equivalent per week     Comment: "not with preg"  . Drug use: No     Comment: History of cocaine use at age 89 for 4 months  . Sexual activity: Yes    Birth control/ protection: None   Other Topics Concern  . Not on file   Social History Narrative   Marital status: married; guardian is Midwife      Children: none      Lives: with husband in apartment      Employment:  Disability      Tobacco: quit smoking; smoked for two years.      Alcohol ;none      Drugs: none             PHYSICAL EXAM  Vitals:   12/22/15 1105  BP: (!) 114/56  Pulse: (!) 108  Resp: 18  Weight: 182 lb (82.6 kg)  Height: 5\' 2"  (1.575 m)    Body mass index is 33.29 kg/m.   General: The patient is well-developed well-nourished Pregnant woman in no acute distress  Musculoskeletal:  Neck is nontender   Neurologic Exam  Mental status: The patient is alert and oriented x 3 at the time of the examination. The patient has apparent normal recent and remote memory, with an apparently normal attention span and concentration ability.   Speech is normal.  Cranial nerves: Extraocular movements are full.   Facial symmetry is present. There is good facial sensation to soft touch bilaterally.Facial strength is normal.  Trapezius and sternocleidomastoid  strength is normal. No dysarthria is noted.  The tongue is midline, and the patient has symmetric elevation of the soft palate. No obvious hearing deficits are noted.  Motor:  Muscle bulk is normal.   Tone is normal. Strength is  5 / 5 in all 4 extremities.   Sensory: Sensory testing is intact to pinprick, soft touch and vibration sensation in arms and she reports slightly reduced vibratory sensation in the left leg compared to the right..  Coordination: Cerebellar testing reveals good finger-nose-finger bilaterally.  Gait and station: Station is normal.   Gait is normal. Tandem gait is slightly wide. Romberg is negative.   Reflexes: Deep tendon reflexes are symmetric and normal bilaterally.        DIAGNOSTIC DATA (LABS, IMAGING, TESTING) - I reviewed patient records, labs, notes, testing and imaging myself where available.  Lab Results  Component Value Date   WBC 9.3 12/19/2015   HGB 10.6 (L) 12/19/2015   HCT 32.5 (L) 12/19/2015   MCV 86.0 12/19/2015   PLT 191 12/19/2015      Component Value Date/Time   NA 135 12/16/2015 2005   NA 140 05/31/2015 1241   K 3.3 (L) 12/16/2015 2005   CL 109 12/16/2015 2005   CO2 19 (L) 12/16/2015 2005   GLUCOSE 108 (H) 12/16/2015 2005   BUN <5 (L) 12/16/2015 2005   BUN 6 05/31/2015 1241   CREATININE 0.49 12/16/2015 2005   CREATININE 0.60 05/05/2015 1051   CALCIUM 9.0 12/16/2015 2005   PROT 5.9 (L) 12/16/2015 2005   PROT 7.3 05/31/2015 1241   ALBUMIN 3.0 (L) 12/16/2015 2005   ALBUMIN 4.7 05/31/2015 1241   AST 19 12/16/2015 2005   ALT 18 12/16/2015 2005   ALKPHOS 39 12/16/2015 2005   BILITOT 0.1 (L) 12/16/2015 2005   BILITOT 0.3 05/31/2015 1241   GFRNONAA >60 12/16/2015 2005  GFRAA >60 12/16/2015 2005    Lab Results  Component Value Date   TSH 1.20 05/05/2015       ASSESSMENT AND PLAN  Seizure disorder in pregnancy, antepartum (HCC)  Schizoaffective disorder, bipolar type by history([facility]CC)  Chronic migraine    1.    Continue Keppra to 750 mg po tid.   If she has more spells, Increase to 1000 mg po tid and consider addition of an additional agent. 2.   She is advised to try to get at least 7 hours sleep as sleep deprivation can also lower the seizure threshold 3.   She will return to see [name] in 4-5 months or call sooner if new or worsening neurologic symptoms.     Kennadee Walthour A. Epimenio Foot, MD, PhD 12/22/2015, 11:29 AM Certified in Neurology, Clinical Neurophysiology, Sleep Medicine, Pain Medicine and Neuroimaging  [facility] [address], [address] [place], [place] [address] [phone]

## 2015-12-26 ENCOUNTER — Ambulatory Visit (HOSPITAL_COMMUNITY)
Admission: RE | Admit: 2015-12-26 | Discharge: 2015-12-26 | Disposition: A | Discharge status: Home / Self Care | Payer: Medicare Other | Source: Ambulatory Visit | Attending: Obstetrics and Gynecology | Admitting: Obstetrics and Gynecology

## 2015-12-26 ENCOUNTER — Other Ambulatory Visit (HOSPITAL_COMMUNITY): Payer: Self-pay | Admitting: Obstetrics and Gynecology

## 2015-12-26 ENCOUNTER — Encounter (HOSPITAL_COMMUNITY): Payer: Self-pay

## 2015-12-26 DIAGNOSIS — J45909 Unspecified asthma, uncomplicated: Secondary | ICD-10-CM | POA: Diagnosis not present

## 2015-12-26 DIAGNOSIS — O99343 Other mental disorders complicating pregnancy, third trimester: Secondary | ICD-10-CM | POA: Insufficient documentation

## 2015-12-26 DIAGNOSIS — O99353 Diseases of the nervous system complicating pregnancy, third trimester: Secondary | ICD-10-CM | POA: Insufficient documentation

## 2015-12-26 DIAGNOSIS — Z148 Genetic carrier of other disease: Secondary | ICD-10-CM

## 2015-12-26 DIAGNOSIS — O99340 Other mental disorders complicating pregnancy, unspecified trimester: Secondary | ICD-10-CM

## 2015-12-26 DIAGNOSIS — G40909 Epilepsy, unspecified, not intractable, without status epilepticus: Secondary | ICD-10-CM

## 2015-12-26 DIAGNOSIS — Z3A28 28 weeks gestation of pregnancy: Secondary | ICD-10-CM | POA: Diagnosis not present

## 2015-12-26 DIAGNOSIS — O99513 Diseases of the respiratory system complicating pregnancy, third trimester: Secondary | ICD-10-CM | POA: Diagnosis not present

## 2015-12-27 ENCOUNTER — Ambulatory Visit (INDEPENDENT_AMBULATORY_CARE_PROVIDER_SITE_OTHER): Payer: Medicare Other | Admitting: Family Medicine

## 2015-12-27 VITALS — BP 123/71 | HR 108 | Wt 181.8 lb

## 2015-12-27 DIAGNOSIS — F603 Borderline personality disorder: Secondary | ICD-10-CM

## 2015-12-27 DIAGNOSIS — J4520 Mild intermittent asthma, uncomplicated: Secondary | ICD-10-CM

## 2015-12-27 DIAGNOSIS — F4310 Post-traumatic stress disorder, unspecified: Secondary | ICD-10-CM

## 2015-12-27 DIAGNOSIS — F250 Schizoaffective disorder, bipolar type: Secondary | ICD-10-CM

## 2015-12-27 DIAGNOSIS — Z6791 Unspecified blood type, Rh negative: Secondary | ICD-10-CM

## 2015-12-27 DIAGNOSIS — O99343 Other mental disorders complicating pregnancy, third trimester: Secondary | ICD-10-CM

## 2015-12-27 DIAGNOSIS — O26899 Other specified pregnancy related conditions, unspecified trimester: Secondary | ICD-10-CM

## 2015-12-27 DIAGNOSIS — O99513 Diseases of the respiratory system complicating pregnancy, third trimester: Secondary | ICD-10-CM

## 2015-12-27 DIAGNOSIS — Z3A29 29 weeks gestation of pregnancy: Secondary | ICD-10-CM

## 2015-12-27 DIAGNOSIS — F840 Autistic disorder: Secondary | ICD-10-CM

## 2015-12-27 DIAGNOSIS — Z148 Genetic carrier of other disease: Secondary | ICD-10-CM

## 2015-12-27 DIAGNOSIS — G40909 Epilepsy, unspecified, not intractable, without status epilepticus: Secondary | ICD-10-CM

## 2015-12-27 DIAGNOSIS — O99353 Diseases of the nervous system complicating pregnancy, third trimester: Secondary | ICD-10-CM

## 2015-12-27 DIAGNOSIS — O99350 Diseases of the nervous system complicating pregnancy, unspecified trimester: Secondary | ICD-10-CM

## 2015-12-27 DIAGNOSIS — O0990 Supervision of high risk pregnancy, unspecified, unspecified trimester: Secondary | ICD-10-CM

## 2015-12-27 NOTE — Progress Notes (Signed)
   Subjective:  SORIAH DENHOLM is a 25 y.o. G2P0010 at [redacted]w[redacted]d being seen today for ongoing prenatal care.  She is currently monitored for the following issues for this high-risk pregnancy and has Autism spectrum disorder; Schizoaffective disorder, bipolar type by history(HCC); Borderline personality disorder; PTSD (post-traumatic stress disorder); Convulsion ([place]); Asthma; Chronic migraine; Supervision of high risk pregnancy, antepartum; Seizure disorder ([place]); Seizure disorder in pregnancy, antepartum ([place]); Rh negative, antepartum; and 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 on her problem list.  Patient reports no complaints.  Contractions: Not present.  .  Movement: Present. Denies leaking of fluid.   The following portions of the patient's history were reviewed and updated as appropriate: allergies, current medications, past family history, past medical history, past social history, past surgical history and problem list. Problem list updated.  Objective:   Vitals:   12/27/15 0845  BP: 123/71  Pulse: (!) 108  Weight: 181 lb 12.8 oz (82.5 kg)    Fetal Status: Fetal Heart Rate (bpm): 145   Movement: Present     General:  Alert, oriented and cooperative. Patient is in no acute distress.  Skin: Skin is warm and dry. No rash noted.   Cardiovascular: Normal heart rate noted  Respiratory: Normal respiratory effort, no problems with respiration noted  Abdomen: Soft, gravid, appropriate for gestational age. Pain/Pressure: Absent     Pelvic:  Cervical exam deferred        Extremities: Normal range of motion.     Mental Status: Normal mood and affect. Normal behavior. Normal judgment and thought content.   Urinalysis:      Assessment and Plan:  Pregnancy: G2P0010 at [redacted]w[redacted]d  1. [redacted] weeks gestation of pregnancy - Glucose Tolerance, 2 Hours w/1 Hour  2. Supervision of high risk pregnancy, antepartum - GTT today  3. Rh negative, antepartum - Rhogam given last visit  4. Borderline personality  disorder  5. Schizoaffective disorder, bipolar type by history(HCC)  6. Autism spectrum disorder  7. PTSD (post-traumatic stress disorder)  8. 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  9. Seizure disorder in pregnancy, antepartum ([place])  10. Mild intermittent asthma without complication   Preterm labor symptoms and general obstetric precautions including but not limited to vaginal bleeding, contractions, leaking of fluid and fetal movement were reviewed in detail with the patient. Please refer to After Visit Summary for other counseling recommendations.  Return in about 4 weeks (around 01/24/2016) for Routine OB visit.   Isaias Sakai, DO [facility], [facility]

## 2015-12-27 NOTE — Patient Instructions (Signed)

## 2015-12-28 LAB — GLUCOSE TOLERANCE, 2 HOURS W/ 1HR
GLUCOSE, FASTING: 88 mg/dL (ref 65–99)
GLUCOSE: 117 mg/dL
Glucose, 2 hour: 130 mg/dL (ref <140)

## 2016-01-05 ENCOUNTER — Encounter (HOSPITAL_COMMUNITY): Payer: Self-pay

## 2016-01-05 ENCOUNTER — Ambulatory Visit (HOSPITAL_COMMUNITY)
Admission: RE | Admit: 2016-01-05 | Discharge: 2016-01-05 | Disposition: A | Discharge status: Home / Self Care | Payer: Medicare Other | Source: Ambulatory Visit | Attending: Obstetrics and Gynecology | Admitting: Obstetrics and Gynecology

## 2016-01-05 ENCOUNTER — Other Ambulatory Visit (HOSPITAL_COMMUNITY): Payer: Self-pay | Admitting: Maternal and Fetal Medicine

## 2016-01-05 DIAGNOSIS — Z3A30 30 weeks gestation of pregnancy: Secondary | ICD-10-CM

## 2016-01-05 DIAGNOSIS — O288 Other abnormal findings on antenatal screening of mother: Secondary | ICD-10-CM

## 2016-01-05 DIAGNOSIS — F209 Schizophrenia, unspecified: Secondary | ICD-10-CM | POA: Insufficient documentation

## 2016-01-05 DIAGNOSIS — Z148 Genetic carrier of other disease: Secondary | ICD-10-CM

## 2016-01-05 DIAGNOSIS — O26899 Other specified pregnancy related conditions, unspecified trimester: Secondary | ICD-10-CM

## 2016-01-05 DIAGNOSIS — O99340 Other mental disorders complicating pregnancy, unspecified trimester: Secondary | ICD-10-CM | POA: Insufficient documentation

## 2016-01-05 DIAGNOSIS — Z6791 Unspecified blood type, Rh negative: Secondary | ICD-10-CM

## 2016-01-05 DIAGNOSIS — G40909 Epilepsy, unspecified, not intractable, without status epilepticus: Secondary | ICD-10-CM

## 2016-01-05 DIAGNOSIS — Z3A29 29 weeks gestation of pregnancy: Secondary | ICD-10-CM

## 2016-01-05 DIAGNOSIS — O99353 Diseases of the nervous system complicating pregnancy, third trimester: Secondary | ICD-10-CM | POA: Insufficient documentation

## 2016-01-06 ENCOUNTER — Ambulatory Visit (HOSPITAL_COMMUNITY): Payer: Medicare Other

## 2016-01-09 ENCOUNTER — Telehealth: Payer: Self-pay | Admitting: Neurology

## 2016-01-09 NOTE — Telephone Encounter (Signed)
I left a message with Dr. Channing Mutters at 4:30 PM.

## 2016-01-09 NOTE — Telephone Encounter (Signed)
[phone] Dr. Roetta Sessions with [facility] mental health called and requested to speak with Dr. Felecia Shelling regarding a plan of care for this patients medication. Please call her in your earliest convenience. Thanks.   Phone number is [phone].

## 2016-01-10 NOTE — Telephone Encounter (Signed)
I discussed her case with Dr. Channing Mutters of psychiatry. [name] has had more depression recently. I think that the seizure risks of the SSRIs is low enough that one of them can be started. Long-term, she might benefit from lamotrigine. She is due about 7 weeks for delivery.

## 2016-01-12 ENCOUNTER — Ambulatory Visit (HOSPITAL_COMMUNITY)
Admission: RE | Admit: 2016-01-12 | Discharge: 2016-01-12 | Disposition: A | Discharge status: Home / Self Care | Payer: Medicare Other | Source: Ambulatory Visit | Attending: Obstetrics and Gynecology | Admitting: Obstetrics and Gynecology

## 2016-01-12 ENCOUNTER — Encounter (HOSPITAL_COMMUNITY): Payer: Self-pay

## 2016-01-12 DIAGNOSIS — Z6791 Unspecified blood type, Rh negative: Secondary | ICD-10-CM | POA: Insufficient documentation

## 2016-01-12 DIAGNOSIS — J45909 Unspecified asthma, uncomplicated: Secondary | ICD-10-CM | POA: Diagnosis not present

## 2016-01-12 DIAGNOSIS — O99513 Diseases of the respiratory system complicating pregnancy, third trimester: Secondary | ICD-10-CM | POA: Insufficient documentation

## 2016-01-12 DIAGNOSIS — O99343 Other mental disorders complicating pregnancy, third trimester: Secondary | ICD-10-CM | POA: Diagnosis not present

## 2016-01-12 DIAGNOSIS — Z3A31 31 weeks gestation of pregnancy: Secondary | ICD-10-CM | POA: Insufficient documentation

## 2016-01-12 DIAGNOSIS — O99353 Diseases of the nervous system complicating pregnancy, third trimester: Secondary | ICD-10-CM | POA: Diagnosis present

## 2016-01-12 DIAGNOSIS — O26893 Other specified pregnancy related conditions, third trimester: Secondary | ICD-10-CM | POA: Diagnosis not present

## 2016-01-12 DIAGNOSIS — G40909 Epilepsy, unspecified, not intractable, without status epilepticus: Secondary | ICD-10-CM | POA: Diagnosis not present

## 2016-01-13 ENCOUNTER — Inpatient Hospital Stay (HOSPITAL_COMMUNITY)
Admission: AD | Admit: 2016-01-13 | Discharge: 2016-01-13 | Disposition: A | Discharge status: Home / Self Care | Payer: Medicare Other | Source: Ambulatory Visit | Attending: Family Medicine | Admitting: Family Medicine

## 2016-01-13 ENCOUNTER — Inpatient Hospital Stay (HOSPITAL_COMMUNITY): Payer: Medicare Other

## 2016-01-13 ENCOUNTER — Encounter (HOSPITAL_COMMUNITY): Payer: Self-pay | Admitting: *Deleted

## 2016-01-13 DIAGNOSIS — Z87891 Personal history of nicotine dependence: Secondary | ICD-10-CM | POA: Insufficient documentation

## 2016-01-13 DIAGNOSIS — Z3A31 31 weeks gestation of pregnancy: Secondary | ICD-10-CM | POA: Insufficient documentation

## 2016-01-13 DIAGNOSIS — R42 Dizziness and giddiness: Secondary | ICD-10-CM | POA: Diagnosis present

## 2016-01-13 DIAGNOSIS — R000 Tachycardia, unspecified: Secondary | ICD-10-CM

## 2016-01-13 DIAGNOSIS — O368390 Maternal care for abnormalities of the fetal heart rate or rhythm, unspecified trimester, not applicable or unspecified: Secondary | ICD-10-CM

## 2016-01-13 DIAGNOSIS — O26893 Other specified pregnancy related conditions, third trimester: Secondary | ICD-10-CM | POA: Insufficient documentation

## 2016-01-13 MED ORDER — LABETALOL HCL 100 MG PO TABS
100.0000 mg | ORAL_TABLET | Freq: Once | ORAL | Status: AC
  Administered 2016-01-13: 100 mg via ORAL
  Filled 2016-01-13: qty 1

## 2016-01-13 MED ORDER — LABETALOL HCL 100 MG PO TABS: 100.0000 mg | ORAL_TABLET | Freq: Two times a day (BID) | ORAL | 0 refills | Status: DC

## 2016-01-13 NOTE — MAU Note (Signed)
Had ultrasound on Thursday and patient's hr was 134 was in [place] go light headed, dizzy, denies N/V/D. Feels like her heart rate is elevated has been on medication in the past prior to pregnancy.

## 2016-01-13 NOTE — MAU Provider Note (Signed)
Chief Complaint:  Dizziness   Provider saw patient at 1415   HPI: [name] is a 25 y.o. G2P0010 at 40w3dwho presents to maternity admissions reporting fast heart rate and associated dizziness. Has had it happen before and it happened today while shopping. In past was told by Psychiatrist that her medication was causing it.  . She reports good fetal movement, denies LOF, vaginal bleeding, vaginal itching/burning, urinary symptoms, h/a, dizziness, n/v, diarrhea, constipation or fever/chills.  She denies headache, visual changes or RUQ abdominal pain.  Dizziness  This is a recurrent problem. The current episode started today. The problem occurs intermittently. The problem has been gradually improving. Pertinent negatives include no abdominal pain, chest pain, chills, fatigue, fever, nausea or urinary symptoms. Associated symptoms comments: Fast Heart Rate. The symptoms are aggravated by walking. She has tried nothing for the symptoms.   RN Note: Had ultrasound on Thursday and patient's hr was 134 was in [place] go light headed, dizzy, denies N/V/D. Feels like her heart rate is elevated has been on medication in the past prior to pregnancy.   Past Medical History: Past Medical History:  Diagnosis Date  . Acid reflux   . Asthma   . Autism   . 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 Chronic constipation   . Depression   . Drug-seeking behavior   . Essential tremor   . Personality disorder   . Schizo-affective psychosis ([place])   . Schizoaffective disorder, bipolar type ([place])   . Seizures ([place])    Last seizure November 2017    Past obstetric history: OB History  Gravida Para Term Preterm AB Living  2       1    SAB TAB Ectopic Multiple Live Births  1            # Outcome Date GA Lbr Len/2nd Weight Sex Delivery Anes PTL Lv  2 Current           1 SAB 2010              Past Surgical History: Past Surgical History:  Procedure Laterality Date  . MOUTH SURGERY      Family  History: Family History  Problem Relation Age of Onset  . Mental illness Father   . Asthma Father   . PDD Brother   . Seizures Brother     Social History: Social History  Substance Use Topics  . Smoking status: Former Smoker    Packs/day: 0.00  . Smokeless tobacco: Never Used     Comment: Smoked for 2  years age [age]  . Alcohol use 0.6 oz/week    1 Standard drinks or equivalent per week     Comment: "not with preg"    Allergies:  Allergies  Allergen Reactions  . Bee Venom Anaphylaxis  . Coconut Flavor Anaphylaxis  . Geodon [Ziprasidone Hcl] Other (See Comments)    Pt states that this medication causes paralysis of the mouth.    . Haloperidol And Related Other (See Comments)    Pt states that this medication causes paralysis of the mouth.    . Lithium Other (See Comments)    Reaction:  Seizure-like activity.    . Oxycodone Other (See Comments)    Reaction:  Hallucinations   . Seroquel [Quetiapine Fumarate] Other (See Comments)    Pt states that this medication is too strong.   . Shellfish Allergy Anaphylaxis  . Percocet [Oxycodone-Acetaminophen] Other (See Comments)    Reaction:  Hallucinations   .  Phenergan [Promethazine Hcl] Other (See Comments)    Reaction:  Chest pain    . Prilosec [Omeprazole] Nausea And Vomiting  . Tegretol [Carbamazepine] Nausea And Vomiting    Meds:  Prescriptions Prior to Admission  Medication Sig Dispense Refill Last Dose  . acetaminophen (TYLENOL) 500 MG tablet Take 1,000 mg by mouth every 6 (six) hours as needed for mild pain, moderate pain or headache.   Taking  . albuterol (PROVENTIL HFA;VENTOLIN HFA) 108 (90 Base) MCG/ACT inhaler Inhale 1-2 puffs into the lungs every 6 (six) hours as needed for wheezing or shortness of breath.   Taking  . calcium carbonate (TUMS - DOSED IN MG ELEMENTAL CALCIUM) 500 MG chewable tablet Chew 2 tablets by mouth as needed for indigestion or heartburn.    Taking  . diphenhydrAMINE (BENADRYL) 25 mg capsule  Take 50 mg by mouth at bedtime as needed for sleep.    Taking  . docusate sodium (COLACE) 100 MG capsule Take 200 mg by mouth daily.    Taking  . Ergocalciferol (VITAMIN D2) 400 units TABS Take 800 Units by mouth daily.   Taking  . fluticasone-salmeterol (ADVAIR HFA) 115-21 MCG/ACT inhaler Inhale 2 puffs into the lungs 2 (two) times daily. 1 Inhaler 5 Taking  . levETIRAcetam (KEPPRA) 750 MG tablet Take 1 tablet (750 mg total) by mouth 3 (three) times daily. 90 tablet 11 Taking  . Menthol, Topical Analgesic, (BENGAY EX) Apply 1 application topically as needed (for pain).    Taking  . Prenatal Vit-Fe Fumarate-FA (PRENATAL COMPLETE) 14-0.4 MG TABS Take 1 tablet by mouth at bedtime.   Taking  . pyridOXINE (VITAMIN B-6) 100 MG tablet Take 200 mg by mouth 2 (two) times daily.   Taking  . ranitidine (ZANTAC) 150 MG tablet Take 150 mg by mouth 2 (two) times daily.   Taking  . risperiDONE (RISPERDAL) 2 MG tablet Take 2 mg by mouth 2 (two) times daily.   0 Taking  . senna (SENOKOT) 8.6 MG tablet Take 2 tablets by mouth at bedtime.    Taking    I have reviewed patient's Past Medical Hx, Surgical Hx, Family Hx, Social Hx, medications and allergies.   ROS:  Review of Systems  Constitutional: Negative for chills, fatigue and fever.  Cardiovascular: Negative for chest pain.  Gastrointestinal: Negative for abdominal pain and nausea.  Neurological: Positive for dizziness.   Other systems negative  Physical Exam  Patient Vitals for the past 24 hrs:  BP Temp Pulse Resp SpO2 Height Weight  01/13/16 1428 - - 117 - 98 % - -  01/13/16 1423 - - 119 - 99 % - -  01/13/16 1418 - - 110 - - - -  01/13/16 1413 - - (!) 131 - 97 % - -  01/13/16 1352 109/64 97.8 F (36.6 C) (!) 133 18 99 % 5' 3.25" (1.607 m) 185 lb (83.9 kg)   Constitutional: Well-developed, well-nourished female in no acute distress.  Cardiovascular: tachycardic rate and regular rhythm Respiratory: normal effort, clear to auscultation  bilaterally GI: Abd soft, non-tender, gravid appropriate for gestational age.   No rebound or guarding. MS: Extremities nontender, no edema, normal ROM Neurologic: Alert and oriented x 4.  GU: Neg CVAT.  PELVIC EXAM:  deferred  FHT:  Baseline 135 , moderate variability, accelerations present, no decelerations Contractions:   Rare   Labs: No results found for this or any previous visit (from the past 24 hour(s)). A/NEG/-- (07/03 1002)  Imaging:   MAU Course/MDM:  I have ordered labs and reviewed results. EKG done [facility] to read EKG which showed Sinus Tach with some other changes. Cardiologist states these are old EKG changes, no reason for concern now.  They do want to see her in their office and will make her an appt NST reviewed Consult Dr Kennon Rounds Patient had been on Metoprolol before with good response. Cardiologist states we can use that or Labetalol. We gave a dose of Labetalol 100mg  with good reduction in rate. Tolerated by pt Will send home on low dose  Assessment: SIUP at [redacted]w[redacted]d Sinus Tachycardia, symptomatic Possibly due to meds.  Plan: Discharge home Preterm Labor precautions and fetal kick counts Follow up in Office for prenatal visits and recheck of status Cardiology will arrange followup in their office Rx Labetalol 100mg  bid.  [facility] to notify us if has dizziness or syncope with med   Pt stable at time of discharge.  Hansel Feinstein CNM, MSN Certified Nurse-Midwife 01/13/2016 2:34 PM

## 2016-01-13 NOTE — Discharge Instructions (Signed)
Sinus Tachycardia Sinus tachycardia is a kind of fast heartbeat. In sinus tachycardia, the heart beats more than 100 times a minute. Sinus tachycardia starts in a part of the heart called the sinus node. Sinus tachycardia may be harmless, or it may be a sign of a serious condition. What are the causes? This condition may be caused by:  Exercise or exertion.  A fever.  Pain.  Loss of body fluids (dehydration).  Severe bleeding (hemorrhage).  Anxiety and stress.  Certain substances, including:  Alcohol.  Caffeine.  Tobacco and nicotine products.  Diet pills.  Illegal drugs.  Medical conditions including:  Heart disease.  An infection.  An overactive thyroid (hyperthyroidism).  A lack of red blood cells (anemia). What are the signs or symptoms? Symptoms of this condition include:  A feeling that the heart is beating quickly (palpitations).  Suddenly noticing your heartbeat (cardiac awareness).  Dizziness.  Tiredness (fatigue).  Shortness of breath.  Chest pain.  Nausea.  Fainting. How is this diagnosed? This condition is diagnosed with:  A physical exam.  Other tests, such as:  Blood tests.  An electrocardiogram (ECG). This test measures the electrical activity of the heart.  Holter monitoring. For this test, you wear a device that records your heartbeat for one or more days. You may be referred to a heart specialist (cardiologist). How is this treated? Treatment for this condition depends on the cause or underlying condition. Treatment may involve:  Treating the underlying condition.  Taking new medicines or changing your current medicines as told by your health care provider.  Making changes to your diet or lifestyle.  Practicing relaxation methods. Follow these instructions at home: Lifestyle   Do not use any products that contain nicotine or tobacco, such as cigarettes and e-cigarettes. If you need help quitting, ask your health care  provider.  Learn relaxation methods, like deep breathing, to help you when you get stressed or anxious.  Do not use illegal drugs, such as cocaine.  Do not abuse alcohol. Limit alcohol intake to no more than 1 drink a day for non-pregnant women and 2 drinks a day for men. One drink equals 12 oz of beer, 5 oz of wine, or 1 oz of hard liquor.  Find time to rest and relax often. This reduces stress.  Avoid:  Caffeine.  Stimulants such as over-the-counter diet pills or pills that help you to stay awake.  Situations that cause anxiety or stress. General instructions   Drink enough fluids to keep your urine clear or pale yellow.  Take over-the-counter and prescription medicines only as told by your health care provider.  Keep all follow-up visits as told by your health care provider. This is important. Contact a health care provider if:  You have a fever.  You have vomiting or diarrhea that keeps happening (is persistent). Get help right away if:  You have pain in your chest, upper arms, jaw, or neck.  You become weak or dizzy.  You feel faint.  You have palpitations that do not go away. This information is not intended to replace advice given to you by your health care provider. Make sure you discuss any questions you have with your health care provider. Document Released: 03/01/2004 Document Revised: 08/20/2015 Document Reviewed: 08/06/2014 Elsevier Interactive Patient Education  2017 Elsevier Inc.  

## 2016-01-18 ENCOUNTER — Encounter: Payer: Self-pay | Admitting: Internal Medicine

## 2016-01-19 ENCOUNTER — Other Ambulatory Visit (HOSPITAL_COMMUNITY): Payer: Self-pay

## 2016-01-20 ENCOUNTER — Encounter (HOSPITAL_COMMUNITY): Payer: Self-pay

## 2016-01-20 ENCOUNTER — Other Ambulatory Visit (HOSPITAL_COMMUNITY): Payer: Self-pay | Admitting: Obstetrics and Gynecology

## 2016-01-20 ENCOUNTER — Other Ambulatory Visit (HOSPITAL_COMMUNITY): Payer: Self-pay | Admitting: *Deleted

## 2016-01-20 ENCOUNTER — Ambulatory Visit (HOSPITAL_COMMUNITY)
Admission: RE | Admit: 2016-01-20 | Discharge: 2016-01-20 | Disposition: A | Discharge status: Home / Self Care | Payer: Medicare Other | Source: Ambulatory Visit | Attending: Obstetrics and Gynecology | Admitting: Obstetrics and Gynecology

## 2016-01-20 DIAGNOSIS — Z3A32 32 weeks gestation of pregnancy: Secondary | ICD-10-CM

## 2016-01-20 DIAGNOSIS — O99343 Other mental disorders complicating pregnancy, third trimester: Secondary | ICD-10-CM | POA: Insufficient documentation

## 2016-01-20 DIAGNOSIS — Z148 Genetic carrier of other disease: Secondary | ICD-10-CM | POA: Diagnosis not present

## 2016-01-20 DIAGNOSIS — J45909 Unspecified asthma, uncomplicated: Secondary | ICD-10-CM | POA: Diagnosis not present

## 2016-01-20 DIAGNOSIS — O99351 Diseases of the nervous system complicating pregnancy, first trimester: Secondary | ICD-10-CM

## 2016-01-20 DIAGNOSIS — O99519 Diseases of the respiratory system complicating pregnancy, unspecified trimester: Secondary | ICD-10-CM

## 2016-01-20 DIAGNOSIS — O99353 Diseases of the nervous system complicating pregnancy, third trimester: Secondary | ICD-10-CM | POA: Insufficient documentation

## 2016-01-20 DIAGNOSIS — O99340 Other mental disorders complicating pregnancy, unspecified trimester: Secondary | ICD-10-CM

## 2016-01-20 DIAGNOSIS — G40909 Epilepsy, unspecified, not intractable, without status epilepticus: Secondary | ICD-10-CM

## 2016-01-20 DIAGNOSIS — O360130 Maternal care for anti-D [Rh] antibodies, third trimester, not applicable or unspecified: Secondary | ICD-10-CM | POA: Diagnosis not present

## 2016-01-20 DIAGNOSIS — O9989 Other specified diseases and conditions complicating pregnancy, childbirth and the puerperium: Secondary | ICD-10-CM | POA: Diagnosis not present

## 2016-01-24 ENCOUNTER — Other Ambulatory Visit: Payer: Self-pay

## 2016-01-24 ENCOUNTER — Other Ambulatory Visit (HOSPITAL_COMMUNITY): Payer: Self-pay | Admitting: *Deleted

## 2016-01-24 ENCOUNTER — Encounter: Payer: Self-pay | Admitting: Internal Medicine

## 2016-01-24 ENCOUNTER — Ambulatory Visit (INDEPENDENT_AMBULATORY_CARE_PROVIDER_SITE_OTHER): Payer: Medicare Other | Admitting: Internal Medicine

## 2016-01-24 ENCOUNTER — Encounter: Payer: Self-pay | Admitting: Obstetrics and Gynecology

## 2016-01-24 VITALS — BP 118/68 | HR 113 | Ht 65.0 in | Wt 190.6 lb

## 2016-01-24 DIAGNOSIS — G40909 Epilepsy, unspecified, not intractable, without status epilepticus: Secondary | ICD-10-CM

## 2016-01-24 DIAGNOSIS — R000 Tachycardia, unspecified: Secondary | ICD-10-CM

## 2016-01-24 DIAGNOSIS — O99353 Diseases of the nervous system complicating pregnancy, third trimester: Principal | ICD-10-CM

## 2016-01-24 MED ORDER — LABETALOL HCL 100 MG PO TABS: 100.0000 mg | ORAL_TABLET | Freq: Two times a day (BID) | ORAL | 2 refills | Status: DC

## 2016-01-24 NOTE — Patient Instructions (Signed)
Medication Instructions:  Your physician recommends that you continue on your current medications as directed. Please refer to the Current Medication list given to you today.   Labwork: None Ordered   Testing/Procedures: None Ordered   Follow-Up: Follow-up with Dr. Taylor as needed    Any Other Special Instructions Will Be Listed Below (If Applicable).     If you need a refill on your cardiac medications before your next appointment, please call your pharmacy.   

## 2016-01-24 NOTE — Progress Notes (Signed)
HPI [name] is referrred today for evaluation of sinus tachycardia. The patient is a pleasant 25 yo woman who is in her 3rd trimester of pregnancy. She has schizoaffective disorder. She was seen several weeks ago and found to have sinus tachycardia at rates of 165/min. The patient was placed on Labetalol. She is on multiple medications. She denies sob except when she goes up an incline. No edema. Allergies  Allergen Reactions  . Bee Venom Anaphylaxis  . Coconut Flavor Anaphylaxis  . Geodon [Ziprasidone Hcl] Other (See Comments)    Pt states that this medication causes paralysis of the mouth.    . Haloperidol And Related Other (See Comments)    Pt states that this medication causes paralysis of the mouth.    . Lithium Other (See Comments)    Reaction:  Seizure-like activity.    . Oxycodone Other (See Comments)    Reaction:  Hallucinations   . Seroquel [Quetiapine Fumarate] Other (See Comments)    Pt states that this medication is too strong.   . Shellfish Allergy Anaphylaxis  . Percocet [Oxycodone-Acetaminophen] Other (See Comments)    Reaction:  Hallucinations   . Phenergan [Promethazine Hcl] Other (See Comments)    Reaction:  Chest pain    . Prilosec [Omeprazole] Nausea And Vomiting  . Tegretol [Carbamazepine] Nausea And Vomiting     Current Outpatient Prescriptions  Medication Sig Dispense Refill  . acetaminophen (TYLENOL) 500 MG tablet Take 1,000 mg by mouth every 6 (six) hours as needed for mild pain, moderate pain or headache.    . albuterol (PROVENTIL HFA;VENTOLIN HFA) 108 (90 Base) MCG/ACT inhaler Inhale 1-2 puffs into the lungs every 6 (six) hours as needed for wheezing or shortness of breath.    . calcium carbonate (TUMS - DOSED IN MG ELEMENTAL CALCIUM) 500 MG chewable tablet Chew 2 tablets by mouth as needed for indigestion or heartburn.     . diphenhydrAMINE (BENADRYL) 25 mg capsule Take 50 mg by mouth at bedtime as needed for sleep.     Marland Kitchen docusate sodium  (COLACE) 100 MG capsule Take 200 mg by mouth daily.     . Ergocalciferol (VITAMIN D2) 400 units TABS Take 800 Units by mouth daily.    . fluticasone-salmeterol (ADVAIR HFA) 115-21 MCG/ACT inhaler Inhale 2 puffs into the lungs 2 (two) times daily. 1 Inhaler 5  . labetalol (NORMODYNE) 100 MG tablet Take 1 tablet (100 mg total) by mouth 2 (two) times daily. 60 tablet 2  . levETIRAcetam (KEPPRA) 750 MG tablet Take 1 tablet (750 mg total) by mouth 3 (three) times daily. 90 tablet 11  . Menthol, Topical Analgesic, (BENGAY EX) Apply 1 application topically as needed (for pain).     . Prenatal Vit-Fe Fumarate-FA (PRENATAL COMPLETE) 14-0.4 MG TABS Take 1 tablet by mouth at bedtime.    . pyridOXINE (VITAMIN B-6) 100 MG tablet Take 200 mg by mouth 2 (two) times daily.    . ranitidine (ZANTAC) 150 MG tablet Take 150 mg by mouth 2 (two) times daily.    . risperiDONE (RISPERDAL) 2 MG tablet Take 2 mg by mouth 2 (two) times daily.   0  . senna (SENOKOT) 8.6 MG tablet Take 2 tablets by mouth at bedtime.     . sertraline (ZOLOFT) 25 MG tablet Take 25 mg by mouth daily.  0   No current facility-administered medications for this visit.      Past Medical History:  Diagnosis Date  . Acid reflux   .  Asthma   . Autism   . 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 Chronic constipation   . Depression   . Drug-seeking behavior   . Essential tremor   . Personality disorder   . Schizo-affective psychosis ([place])   . Schizoaffective disorder, bipolar type ([place])   . Seizures ([place])    Last seizure November 2017    ROS:   All systems reviewed and negative except as noted in the HPI.   Past Surgical History:  Procedure Laterality Date  . MOUTH SURGERY       Family History  Problem Relation Age of Onset  . Mental illness Father   . Asthma Father   . PDD Brother   . Seizures Brother      Social History   Social History  . Marital status: Married    Spouse name: N/A  . Number of children: 0  . Years of  education: N/A   Occupational History  . disability    Social History Main Topics  . Smoking status: Former Smoker    Packs/day: 0.00  . Smokeless tobacco: Never Used     Comment: Smoked for 2  years age [age]  . Alcohol use 0.6 oz/week    1 Standard drinks or equivalent per week     Comment: "not with preg"  . Drug use: No     Comment: History of cocaine use at age 58 for 4 months  . Sexual activity: Yes    Birth control/ protection: None   Other Topics Concern  . Not on file   Social History Narrative   Marital status: married; guardian is Furniture conservator/restorer      Children: none      Lives: with husband in apartment      Employment:  Disability      Tobacco: quit smoking; smoked for two years.      Alcohol ;none      Drugs: none             BP 118/68   Pulse (!) 113   Ht 5\' 5"  (1.651 m)   Wt 190 lb 9.6 oz (86.5 kg)   LMP 06/07/2015   BMI 31.72 kg/m   Physical Exam:  Well appearing 25 yo woman, NAD HEENT: Unremarkable Neck:  6 cm JVD, no thyromegally Lymphatics:  No adenopathy Back:  No CVA tenderness Lungs:  Clear with no wheezes HEART:  Regular rate rhythm, no murmurs, no rubs, no clicks Abd:  soft, gravid, positive bowel sounds, no organomegally, no rebound, no guarding Ext:  2 plus pulses, no edema, no cyanosis, no clubbing Skin:  No rashes no nodules Neuro:  CN II through XII intact, motor grossly intact  EKG - sinus tachycardia at 113/min  DEVICE  Normal device function.  See PaceArt for details.   Assess/Plan: 1. Sinus tachycardia - we discussed the benign nature of her sinus tachycardia and that pregnancy often results in her heart racing. She has tolerated labetalol. She has been given a new prescription. She will followup as needed. 2. 3rd trimester gestatiion - other than sinus tachycardia, her pregnance appears to be uncomplicated.  Mikle Bosworth.D.

## 2016-01-26 ENCOUNTER — Encounter (HOSPITAL_COMMUNITY): Payer: Self-pay

## 2016-01-26 ENCOUNTER — Ambulatory Visit (HOSPITAL_COMMUNITY)
Admission: RE | Admit: 2016-01-26 | Discharge: 2016-01-26 | Disposition: A | Discharge status: Home / Self Care | Payer: Medicare Other | Source: Ambulatory Visit | Attending: Obstetrics and Gynecology | Admitting: Obstetrics and Gynecology

## 2016-01-26 ENCOUNTER — Other Ambulatory Visit (HOSPITAL_COMMUNITY): Payer: Self-pay | Admitting: Obstetrics and Gynecology

## 2016-01-26 DIAGNOSIS — Z3A33 33 weeks gestation of pregnancy: Secondary | ICD-10-CM

## 2016-01-26 DIAGNOSIS — O26899 Other specified pregnancy related conditions, unspecified trimester: Secondary | ICD-10-CM

## 2016-01-26 DIAGNOSIS — O99343 Other mental disorders complicating pregnancy, third trimester: Secondary | ICD-10-CM | POA: Insufficient documentation

## 2016-01-26 DIAGNOSIS — F259 Schizoaffective disorder, unspecified: Secondary | ICD-10-CM | POA: Diagnosis not present

## 2016-01-26 DIAGNOSIS — G40909 Epilepsy, unspecified, not intractable, without status epilepticus: Secondary | ICD-10-CM | POA: Diagnosis not present

## 2016-01-26 DIAGNOSIS — Z148 Genetic carrier of other disease: Secondary | ICD-10-CM | POA: Insufficient documentation

## 2016-01-26 DIAGNOSIS — O99513 Diseases of the respiratory system complicating pregnancy, third trimester: Secondary | ICD-10-CM | POA: Diagnosis not present

## 2016-01-26 DIAGNOSIS — O99353 Diseases of the nervous system complicating pregnancy, third trimester: Secondary | ICD-10-CM | POA: Insufficient documentation

## 2016-01-26 DIAGNOSIS — F840 Autistic disorder: Secondary | ICD-10-CM | POA: Diagnosis not present

## 2016-01-26 DIAGNOSIS — J45909 Unspecified asthma, uncomplicated: Secondary | ICD-10-CM | POA: Diagnosis not present

## 2016-01-26 DIAGNOSIS — Z6791 Unspecified blood type, Rh negative: Secondary | ICD-10-CM

## 2016-01-26 NOTE — Addendum Note (Signed)
Encounter addended by: Jill Poling, RT on: 01/26/2016  3:32 PM<BR>    Actions taken: Imaging Exam ended

## 2016-01-28 ENCOUNTER — Inpatient Hospital Stay (HOSPITAL_COMMUNITY): Payer: Medicare Other

## 2016-01-28 ENCOUNTER — Inpatient Hospital Stay (HOSPITAL_COMMUNITY)
Admission: AD | Admit: 2016-01-28 | Discharge: 2016-01-28 | Disposition: A | Discharge status: Home / Self Care | Payer: Medicare Other | Source: Ambulatory Visit | Attending: Obstetrics & Gynecology | Admitting: Obstetrics & Gynecology

## 2016-01-28 ENCOUNTER — Encounter (HOSPITAL_COMMUNITY): Payer: Self-pay

## 2016-01-28 DIAGNOSIS — Z87891 Personal history of nicotine dependence: Secondary | ICD-10-CM | POA: Insufficient documentation

## 2016-01-28 DIAGNOSIS — Z888 Allergy status to other drugs, medicaments and biological substances status: Secondary | ICD-10-CM | POA: Insufficient documentation

## 2016-01-28 DIAGNOSIS — F329 Major depressive disorder, single episode, unspecified: Secondary | ICD-10-CM | POA: Diagnosis not present

## 2016-01-28 DIAGNOSIS — J45909 Unspecified asthma, uncomplicated: Secondary | ICD-10-CM | POA: Diagnosis not present

## 2016-01-28 DIAGNOSIS — Z91013 Allergy to seafood: Secondary | ICD-10-CM | POA: Insufficient documentation

## 2016-01-28 DIAGNOSIS — F259 Schizoaffective disorder, unspecified: Secondary | ICD-10-CM | POA: Insufficient documentation

## 2016-01-28 DIAGNOSIS — R569 Unspecified convulsions: Secondary | ICD-10-CM | POA: Diagnosis present

## 2016-01-28 DIAGNOSIS — O99343 Other mental disorders complicating pregnancy, third trimester: Secondary | ICD-10-CM | POA: Insufficient documentation

## 2016-01-28 DIAGNOSIS — Z825 Family history of asthma and other chronic lower respiratory diseases: Secondary | ICD-10-CM | POA: Diagnosis not present

## 2016-01-28 DIAGNOSIS — Z79899 Other long term (current) drug therapy: Secondary | ICD-10-CM | POA: Diagnosis not present

## 2016-01-28 DIAGNOSIS — O99613 Diseases of the digestive system complicating pregnancy, third trimester: Secondary | ICD-10-CM | POA: Insufficient documentation

## 2016-01-28 DIAGNOSIS — Z148 Genetic carrier of other disease: Secondary | ICD-10-CM | POA: Insufficient documentation

## 2016-01-28 DIAGNOSIS — G40909 Epilepsy, unspecified, not intractable, without status epilepticus: Secondary | ICD-10-CM | POA: Diagnosis not present

## 2016-01-28 DIAGNOSIS — K219 Gastro-esophageal reflux disease without esophagitis: Secondary | ICD-10-CM | POA: Diagnosis not present

## 2016-01-28 DIAGNOSIS — Z885 Allergy status to narcotic agent status: Secondary | ICD-10-CM | POA: Diagnosis not present

## 2016-01-28 DIAGNOSIS — Z6791 Unspecified blood type, Rh negative: Secondary | ICD-10-CM

## 2016-01-28 DIAGNOSIS — O26893 Other specified pregnancy related conditions, third trimester: Secondary | ICD-10-CM | POA: Insufficient documentation

## 2016-01-28 DIAGNOSIS — O99513 Diseases of the respiratory system complicating pregnancy, third trimester: Secondary | ICD-10-CM | POA: Diagnosis not present

## 2016-01-28 DIAGNOSIS — Z7951 Long term (current) use of inhaled steroids: Secondary | ICD-10-CM | POA: Insufficient documentation

## 2016-01-28 DIAGNOSIS — O99353 Diseases of the nervous system complicating pregnancy, third trimester: Secondary | ICD-10-CM | POA: Insufficient documentation

## 2016-01-28 DIAGNOSIS — Z3A33 33 weeks gestation of pregnancy: Secondary | ICD-10-CM | POA: Diagnosis not present

## 2016-01-28 DIAGNOSIS — Z818 Family history of other mental and behavioral disorders: Secondary | ICD-10-CM | POA: Insufficient documentation

## 2016-01-28 DIAGNOSIS — T1490XA Injury, unspecified, initial encounter: Secondary | ICD-10-CM | POA: Diagnosis not present

## 2016-01-28 DIAGNOSIS — F840 Autistic disorder: Secondary | ICD-10-CM | POA: Insufficient documentation

## 2016-01-28 DIAGNOSIS — O9A213 Injury, poisoning and certain other consequences of external causes complicating pregnancy, third trimester: Secondary | ICD-10-CM

## 2016-01-28 DIAGNOSIS — X58XXXA Exposure to other specified factors, initial encounter: Secondary | ICD-10-CM | POA: Diagnosis not present

## 2016-01-28 DIAGNOSIS — O26899 Other specified pregnancy related conditions, unspecified trimester: Secondary | ICD-10-CM

## 2016-01-28 LAB — RAPID URINE DRUG SCREEN, HOSP PERFORMED
Amphetamines: NOT DETECTED
BENZODIAZEPINES: NOT DETECTED
Barbiturates: NOT DETECTED
COCAINE: NOT DETECTED
Opiates: NOT DETECTED
Tetrahydrocannabinol: NOT DETECTED

## 2016-01-28 LAB — URINALYSIS, ROUTINE W REFLEX MICROSCOPIC
BILIRUBIN URINE: NEGATIVE
GLUCOSE, UA: NEGATIVE mg/dL
HGB URINE DIPSTICK: NEGATIVE
KETONES UR: NEGATIVE mg/dL
Leukocytes, UA: NEGATIVE
NITRITE: NEGATIVE
PH: 7 (ref 5.0–8.0)
Protein, ur: NEGATIVE mg/dL
Specific Gravity, Urine: 1.006 (ref 1.005–1.030)

## 2016-01-28 LAB — KLEIHAUER-BETKE STAIN
# Vials RhIg: 1
Fetal Cells %: 0 %
QUANTITATION FETAL HEMOGLOBIN: 0 mL

## 2016-01-28 MED ORDER — LEVETIRACETAM 750 MG PO TABS
750.0000 mg | ORAL_TABLET | Freq: Once | ORAL | Status: AC
  Administered 2016-01-28: 750 mg via ORAL
  Filled 2016-01-28: qty 1

## 2016-01-28 MED ORDER — LABETALOL HCL 100 MG PO TABS
100.0000 mg | ORAL_TABLET | Freq: Once | ORAL | Status: AC
  Administered 2016-01-28: 100 mg via ORAL
  Filled 2016-01-28: qty 1

## 2016-01-28 MED ORDER — DIPHENHYDRAMINE HCL 25 MG PO CAPS
50.0000 mg | ORAL_CAPSULE | Freq: Once | ORAL | Status: AC
  Administered 2016-01-28: 50 mg via ORAL
  Filled 2016-01-28: qty 2

## 2016-01-28 MED ORDER — RISPERIDONE 1 MG PO TABS
2.0000 mg | ORAL_TABLET | Freq: Once | ORAL | Status: AC
  Administered 2016-01-28: 2 mg via ORAL
  Filled 2016-01-28: qty 2

## 2016-01-28 NOTE — MAU Provider Note (Signed)
History     CSN: [id number]  Arrival date and time: 01/28/16 1727   First Provider Initiated Contact with Patient 01/28/16 1744      Chief Complaint  Patient presents with  . Seizures  . Fall   HPI   [name] is a 25 y.o. female G2P0010 @ [redacted]w[redacted]d with a history of seizure disorder on Keppra here following an unwitnessed seizure/fall at home. Around 1645 her husband heard her fall. When he found her, she was laying flat on her abdomen, her head was to the side and the patient was conscious and mumbling to him. She said prior to having the seizure to was walking down the hall of her house and felt the seizure come on. That is all she can recall.  She says she took her Keppra as prescribed. She is unsure whether she hit her head. She is without Ha or pain.   She denies vaginal bleeding or pain at this time. Her next neurology appointment is in May.   OB History    Gravida Para Term Preterm AB Living   2       1     SAB TAB Ectopic Multiple Live Births   1              Past Medical History:  Diagnosis Date  . Acid reflux   . Asthma   . Autism   . 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 Chronic constipation   . Depression   . Drug-seeking behavior   . Essential tremor   . Personality disorder   . Schizo-affective psychosis ([place])   . Schizoaffective disorder, bipolar type ([place])   . Seizures [facility])    Last seizure November 2017    Past Surgical History:  Procedure Laterality Date  . MOUTH SURGERY      Family History  Problem Relation Age of Onset  . Mental illness Father   . Asthma Father   . PDD Brother   . Seizures Brother     Social History  Substance Use Topics  . Smoking status: Former Smoker    Packs/day: 0.00  . Smokeless tobacco: Never Used     Comment: Smoked for 2  years age [age]  . Alcohol use 0.6 oz/week    1 Standard drinks or equivalent per week     Comment: "not with preg"    Allergies:  Allergies  Allergen Reactions  . Bee Venom  Anaphylaxis  . Coconut Flavor Anaphylaxis  . Geodon [Ziprasidone Hcl] Other (See Comments)    Pt states that this medication causes paralysis of the mouth.    . Haloperidol And Related Other (See Comments)    Pt states that this medication causes paralysis of the mouth.    . Lithium Other (See Comments)    Reaction:  Seizure-like activity.    . Oxycodone Other (See Comments)    Reaction:  Hallucinations   . Seroquel [Quetiapine Fumarate] Other (See Comments)    Pt states that this medication is too strong.   . Shellfish Allergy Anaphylaxis  . Percocet [Oxycodone-Acetaminophen] Other (See Comments)    Reaction:  Hallucinations   . Phenergan [Promethazine Hcl] Other (See Comments)    Reaction:  Chest pain    . Prilosec [Omeprazole] Nausea And Vomiting  . Tegretol [Carbamazepine] Nausea And Vomiting    Prescriptions Prior to Admission  Medication Sig Dispense Refill Last Dose  . acetaminophen (TYLENOL) 500 MG tablet Take 1,000 mg by mouth every  6 (six) hours as needed for mild pain, moderate pain or headache.   Taking  . albuterol (PROVENTIL HFA;VENTOLIN HFA) 108 (90 Base) MCG/ACT inhaler Inhale 1-2 puffs into the lungs every 6 (six) hours as needed for wheezing or shortness of breath.   Taking  . calcium carbonate (TUMS - DOSED IN MG ELEMENTAL CALCIUM) 500 MG chewable tablet Chew 2 tablets by mouth as needed for indigestion or heartburn.    Taking  . diphenhydrAMINE (BENADRYL) 25 mg capsule Take 50 mg by mouth at bedtime as needed for sleep.    Taking  . docusate sodium (COLACE) 100 MG capsule Take 200 mg by mouth daily.    Taking  . Ergocalciferol (VITAMIN D2) 400 units TABS Take 800 Units by mouth daily.   Taking  . fluticasone-salmeterol (ADVAIR HFA) 115-21 MCG/ACT inhaler Inhale 2 puffs into the lungs 2 (two) times daily. 1 Inhaler 5 Taking  . labetalol (NORMODYNE) 100 MG tablet Take 1 tablet (100 mg total) by mouth 2 (two) times daily. 60 tablet 2 Taking  . levETIRAcetam (KEPPRA)  750 MG tablet Take 1 tablet (750 mg total) by mouth 3 (three) times daily. 90 tablet 11 Taking  . Menthol, Topical Analgesic, (BENGAY EX) Apply 1 application topically as needed (for pain).    Taking  . Prenatal Vit-Fe Fumarate-FA (PRENATAL COMPLETE) 14-0.4 MG TABS Take 1 tablet by mouth at bedtime.   Taking  . pyridOXINE (VITAMIN B-6) 100 MG tablet Take 200 mg by mouth 2 (two) times daily.   Taking  . ranitidine (ZANTAC) 150 MG tablet Take 150 mg by mouth 2 (two) times daily.   Taking  . risperiDONE (RISPERDAL) 2 MG tablet Take 2 mg by mouth 2 (two) times daily.   0 Taking  . senna (SENOKOT) 8.6 MG tablet Take 2 tablets by mouth at bedtime.    Taking  . sertraline (ZOLOFT) 25 MG tablet Take 25 mg by mouth daily.  0 Taking   Results for orders placed or performed during the hospital encounter of 01/28/16 (from the past 48 hour(s))  Urinalysis, Routine w reflex microscopic     Status: None   Collection Time: 01/28/16  5:31 PM  Result Value Ref Range   Color, Urine YELLOW YELLOW   APPearance CLEAR CLEAR   Specific Gravity, Urine 1.006 1.005 - 1.030   pH 7.0 5.0 - 8.0   Glucose, UA NEGATIVE NEGATIVE mg/dL   Hgb urine dipstick NEGATIVE NEGATIVE   Bilirubin Urine NEGATIVE NEGATIVE   Ketones, ur NEGATIVE NEGATIVE mg/dL   Protein, ur NEGATIVE NEGATIVE mg/dL   Nitrite NEGATIVE NEGATIVE   Leukocytes, UA NEGATIVE NEGATIVE    Review of Systems  Gastrointestinal: Negative for abdominal pain, nausea and vomiting.  Neurological: Negative for dizziness and headaches.   Physical Exam   Blood pressure 116/65, pulse 104, temperature 98.7 F (37.1 C), resp. rate 18, last menstrual period 06/07/2015, SpO2 98 %.  Physical Exam  Constitutional: She is oriented to person, place, and time. She appears well-developed and well-nourished. No distress.  HENT:  Head: Normocephalic.  Respiratory: Effort normal.  GI: Soft. She exhibits no distension. There is no tenderness. There is no rebound and no  guarding.  Genitourinary:  Genitourinary Comments: Dilation: Closed Exam by:: J Rasch NP  Musculoskeletal: Normal range of motion.  Neurological: She is alert and oriented to person, place, and time. She has normal strength. GCS eye subscore is 4. GCS verbal subscore is 5. GCS motor subscore is 6.  Skin: Skin is  warm. She is not diaphoretic.  Psychiatric: Her behavior is normal.   Fetal Tracing: Baseline: 155 bpm  Variability: Episodes of minimal variability  Accelerations: 15x15 and 10x10 Decelerations: 1 quick variable  Toco: Occasional contraction   MAU Course  Procedures  None  MDM  Discussed patient with Dr. Harolyn Rutherford BPP ordered Limited US to evaluate Placenta  4 hours of fetal monitoring due to trauma/fall  Kleihauer-Betke pending  A negative blood type  Urine drug screen.  Report given to [place] who resumes care of the patient  Lezlie Lye, NP  BPP 8/8, no evidence of abruption. FHR reactive. No further UC's. Denies pain or vaginal bleeding. Scheduled Keppra, Risperdal given.   - Fall w/out evidence of abruption or other emergent condition. Possible seizure. Pt reports being compliant w/ seizure meds. No seizures in MAU. Suggest she call [facility] to discuss if dose of Keppra needs to be increased.    Assessment and Plan   1. Traumatic injury during pregnancy in third trimester   2. Rh negative, antepartum    D/C home in stable condition Abruption precautions.  Follow-up Information    SATER,RICHARD A, MD Follow up.   Specialty:  Neurology Why:  Call to discuss if change in seizure medicine is needed.  Contact information: [place] [address] [place] for [facility] Follow up.   Specialty:  Obstetrics and Gynecology Why:  as scheduled for prenatal visit Contact information: [place] [phone]       [facility] Follow up.   Why:  as needed in emergencies (Vaginal bleeding, Preterm contractions, decreased fetal movement) Contact information: [id number] [place] [id number] [phone]         Allergies as of 01/28/2016      Reactions   Bee Venom Anaphylaxis   Coconut Flavor Anaphylaxis   Geodon [ziprasidone Hcl] Other (See Comments)   Pt states that this medication causes paralysis of the mouth.     Haloperidol And Related Other (See Comments)   Pt states that this medication causes paralysis of the mouth.     Lithium Other (See Comments)   Reaction:  Seizure-like activity.    Oxycodone Other (See Comments)   Reaction:  Hallucinations    Seroquel [quetiapine Fumarate] Other (See Comments)   Pt states that this medication is too strong.    Shellfish Allergy Anaphylaxis   Percocet [oxycodone-acetaminophen] Other (See Comments)   Reaction:  Hallucinations    Phenergan [promethazine Hcl] Other (See Comments)   Reaction:  Chest pain    Prilosec [omeprazole] Nausea And Vomiting   Tegretol [carbamazepine] Nausea And Vomiting      Medication List    TAKE these medications   acetaminophen 500 MG tablet Commonly known as:  TYLENOL Take 1,000 mg by mouth every 6 (six) hours as needed for mild pain, moderate pain or headache.   albuterol 108 (90 Base) MCG/ACT inhaler Commonly known as:  PROVENTIL HFA;VENTOLIN HFA Inhale 1-2 puffs into the lungs every 6 (six) hours as needed for wheezing or shortness of breath.   BENGAY EX Apply 1 application topically as needed (for pain).   calcium carbonate 500 MG chewable tablet Commonly known as:  TUMS - dosed in mg elemental calcium Chew 2 tablets by mouth as needed for indigestion or heartburn.   diphenhydrAMINE 25 mg capsule Commonly known as:  BENADRYL  Take 50 mg by mouth at bedtime as needed for sleep.   docusate sodium 100 MG capsule Commonly known as:  COLACE Take 200 mg by mouth  daily.   fluticasone-salmeterol 115-21 MCG/ACT inhaler Commonly known as:  ADVAIR HFA Inhale 2 puffs into the lungs 2 (two) times daily.   labetalol 100 MG tablet Commonly known as:  NORMODYNE Take 1 tablet (100 mg total) by mouth 2 (two) times daily.   levETIRAcetam 750 MG tablet Commonly known as:  KEPPRA Take 1 tablet (750 mg total) by mouth 3 (three) times daily.   PRENATAL COMPLETE 14-0.4 MG Tabs Take 1 tablet by mouth at bedtime.   pyridOXINE 100 MG tablet Commonly known as:  VITAMIN B-6 Take 200 mg by mouth 2 (two) times daily.   ranitidine 150 MG tablet Commonly known as:  ZANTAC Take 150 mg by mouth 2 (two) times daily.   risperiDONE 2 MG tablet Commonly known as:  RISPERDAL Take 2 mg by mouth 2 (two) times daily.   senna 8.6 MG tablet Commonly known as:  SENOKOT Take 2 tablets by mouth at bedtime.   sertraline 25 MG tablet Commonly known as:  ZOLOFT Take 25 mg by mouth daily.   Vitamin D2 400 units Tabs Take 800 Units by mouth daily.       Verona Walk, [place] 01/28/2016 11:36 PM

## 2016-01-28 NOTE — MAU Note (Signed)
Pt had seizure around 4:45pm and fell. No one was present at the time, not sure if she hit her head. Is on Keppra for the seizures

## 2016-01-28 NOTE — Discharge Instructions (Signed)
What Do I Need to Know About Injuries During Pregnancy? Injuries can happen during pregnancy. Minor falls and accidents usually do not harm you or your baby. However, any injury should be reported to your doctor. What can I do to protect myself from injuries?  Remove rugs and loose objects on the floor.  Wear comfortable shoes that have a good grip. Do not wear high-heeled shoes.  Always wear your seat belt. The lap belt should be below your belly. Always practice safe driving.  Do not ride on a motorcycle.  Do not participate in high-impact activities or sports.  Avoid:  Walking on wet or slippery floors.  Fires.  Starting fires.  Lifting heavy pots of boiling or hot liquids.  Fixing electrical problems.  Only take medicine as told by your doctor.  Know your blood type and the blood type of the baby's father.  Call your local emergency services (911 in the [place] if you are a victim of domestic violence or assault. For help and support, contact the [employer]. Get help right away if:  You fall on your belly or have any high-impact accident or injury.  You have been a victim of domestic violence or any kind of violence.  You have been in a car accident.  You have bleeding from your vagina.  Fluid is leaking from your vagina.  You start to have belly cramping (contractions) or pain.  You feel weak or pass out (faint).  You start to throw up (vomit) after an injury.  You have been burned.  You have a stiff neck or neck pain.  You get a headache or have vision problems after an injury.  You do not feel the baby move or the baby is not moving as much as normal. This information is not intended to replace advice given to you by your health care provider. Make sure you discuss any questions you have with your health care provider. Document Released: 02/24/2010 Document Revised: 06/30/2015 Document Reviewed: 10/29/2012 Elsevier Interactive  Patient Education  2017 [employer].

## 2016-02-01 ENCOUNTER — Ambulatory Visit (INDEPENDENT_AMBULATORY_CARE_PROVIDER_SITE_OTHER): Payer: Medicare Other | Admitting: Obstetrics and Gynecology

## 2016-02-01 VITALS — BP 99/58 | HR 111

## 2016-02-01 DIAGNOSIS — G40909 Epilepsy, unspecified, not intractable, without status epilepticus: Secondary | ICD-10-CM

## 2016-02-01 DIAGNOSIS — Z6791 Unspecified blood type, Rh negative: Secondary | ICD-10-CM

## 2016-02-01 DIAGNOSIS — O26899 Other specified pregnancy related conditions, unspecified trimester: Secondary | ICD-10-CM

## 2016-02-01 DIAGNOSIS — O99350 Diseases of the nervous system complicating pregnancy, unspecified trimester: Principal | ICD-10-CM

## 2016-02-01 DIAGNOSIS — O99353 Diseases of the nervous system complicating pregnancy, third trimester: Secondary | ICD-10-CM

## 2016-02-01 NOTE — Progress Notes (Signed)
Prenatal Visit Note Date: 02/01/2016 Clinic: [facility]  Subjective:  [name] is a 25 y.o. G2P0010 at [redacted]w[redacted]d being seen today for ongoing prenatal care.  She is currently monitored for the following issues for this low-risk pregnancy and has Autism spectrum disorder; Schizoaffective disorder, bipolar type by history(HCC); Borderline personality disorder; PTSD (post-traumatic stress disorder); Asthma; Chronic migraine; Supervision of high risk pregnancy, antepartum; Seizure disorder ([place]); Seizure disorder in pregnancy, antepartum ([place]); Rh negative, antepartum; 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; and Tachycardia with heart rate 121-140 beats per minute on her problem list.  Patient reports no issues. no seizures since last visit on 12/23.Marland Kitchen   Contractions: Not present. Vag. Bleeding: None.  Movement: Present. Denies leaking of fluid.   The following portions of the patient's history were reviewed and updated as appropriate: allergies, current medications, past family history, past medical history, past social history, past surgical history and problem list. Problem list updated.  Objective:   Vitals:   02/01/16 0748  BP: (!) 99/58  Pulse: (!) 111    Fetal Status: Fetal Heart Rate (bpm): 155 Fundal Height: 34 cm Movement: Present     General:  Alert, oriented and cooperative. Patient is in no acute distress.  Skin: Skin is warm and dry. No rash noted.   Cardiovascular: Normal heart rate noted  Respiratory: Normal respiratory effort, no problems with respiration noted  Abdomen: Soft, gravid, appropriate for gestational age. Pain/Pressure: Absent     Pelvic:  Cervical exam deferred        Extremities: Normal range of motion.  Edema: None  Mental Status: Normal mood and affect. Normal behavior. Normal judgment and thought content.   Urinalysis:      Assessment and Plan:  Pregnancy: G2P0010 at [redacted]w[redacted]d  1. Seizure disorder in pregnancy, antepartum [facility]) Patient  currently on keppra 750 tid and she states that she sees [facility]. I told her to give them a call and I'll forward my note to them today to see about checking levels and/or going up on her dosing. Has qwk BPP scheduled for tomorrow and for a few weeks out and growth scan two weeks ago normal with large AC (normal GTT).   2. Rh negative, antepartum S/p rhogam already.   3. Psych On no meds and pt doing well.  Preterm labor symptoms and general obstetric precautions including but not limited to vaginal bleeding, contractions, leaking of fluid and fetal movement were reviewed in detail with the patient. Please refer to After Visit Summary for other counseling recommendations.  Return in about 10 days (around 02/11/2016).   Aletha Halim, MD

## 2016-02-02 ENCOUNTER — Encounter (HOSPITAL_COMMUNITY): Payer: Self-pay

## 2016-02-02 ENCOUNTER — Ambulatory Visit (HOSPITAL_COMMUNITY)
Admission: RE | Admit: 2016-02-02 | Discharge: 2016-02-02 | Disposition: A | Discharge status: Home / Self Care | Payer: Medicare Other | Source: Ambulatory Visit | Attending: Obstetrics and Gynecology | Admitting: Obstetrics and Gynecology

## 2016-02-02 DIAGNOSIS — Z3A34 34 weeks gestation of pregnancy: Secondary | ICD-10-CM | POA: Diagnosis not present

## 2016-02-02 DIAGNOSIS — J45909 Unspecified asthma, uncomplicated: Secondary | ICD-10-CM | POA: Diagnosis not present

## 2016-02-02 DIAGNOSIS — O99513 Diseases of the respiratory system complicating pregnancy, third trimester: Secondary | ICD-10-CM | POA: Insufficient documentation

## 2016-02-02 DIAGNOSIS — Z6791 Unspecified blood type, Rh negative: Secondary | ICD-10-CM | POA: Insufficient documentation

## 2016-02-02 DIAGNOSIS — O26893 Other specified pregnancy related conditions, third trimester: Secondary | ICD-10-CM | POA: Insufficient documentation

## 2016-02-02 DIAGNOSIS — O99353 Diseases of the nervous system complicating pregnancy, third trimester: Secondary | ICD-10-CM | POA: Insufficient documentation

## 2016-02-02 DIAGNOSIS — G40909 Epilepsy, unspecified, not intractable, without status epilepticus: Secondary | ICD-10-CM | POA: Insufficient documentation

## 2016-02-06 NOTE — L&D Delivery Note (Signed)
26 y.o. G2P0010 at [redacted]w[redacted]d delivered a viable female infant in cephalic, LOA position. Anterior shoulder delivered with ease. 60 sec delayed cord clamping. Cord clamped x2 and cut. Placenta delivered spontaneously intact, with 3VC. Fundus firm on exam with massage and pitocin. Good hemostasis noted.  Baby's HR down to 60s upon delivery. Code apgar called. Neonatology team present and resuscitated baby.   Laceration: 1st degree left labial Suture: 3.0 Vicryl Good hemostasis noted. EBL 38cc  Mom and baby recovering in LDR.    Apgars: 5/8 Weight: pending    Eloise Levels, MD PGY-1 03/16/2016, 11:08 AM   OB FELLOW DELIVERY ATTESTATION  I was gloved and present for the delivery in its entirety, and I agree with the above resident's note.    Jacquiline Doe, MD 11:25 AM

## 2016-02-09 ENCOUNTER — Other Ambulatory Visit (HOSPITAL_COMMUNITY): Payer: Self-pay | Admitting: Obstetrics and Gynecology

## 2016-02-09 ENCOUNTER — Encounter (HOSPITAL_COMMUNITY): Payer: Self-pay

## 2016-02-09 ENCOUNTER — Ambulatory Visit (HOSPITAL_COMMUNITY)
Admission: RE | Admit: 2016-02-09 | Discharge: 2016-02-09 | Disposition: A | Discharge status: Home / Self Care | Payer: PPO | Source: Ambulatory Visit | Attending: Obstetrics and Gynecology | Admitting: Obstetrics and Gynecology

## 2016-02-09 DIAGNOSIS — J45909 Unspecified asthma, uncomplicated: Secondary | ICD-10-CM | POA: Diagnosis not present

## 2016-02-09 DIAGNOSIS — G40909 Epilepsy, unspecified, not intractable, without status epilepticus: Secondary | ICD-10-CM

## 2016-02-09 DIAGNOSIS — Q992 Fragile X chromosome: Secondary | ICD-10-CM

## 2016-02-09 DIAGNOSIS — O99353 Diseases of the nervous system complicating pregnancy, third trimester: Secondary | ICD-10-CM | POA: Insufficient documentation

## 2016-02-09 DIAGNOSIS — F259 Schizoaffective disorder, unspecified: Secondary | ICD-10-CM

## 2016-02-09 DIAGNOSIS — Z3A35 35 weeks gestation of pregnancy: Secondary | ICD-10-CM | POA: Insufficient documentation

## 2016-02-09 DIAGNOSIS — O99343 Other mental disorders complicating pregnancy, third trimester: Secondary | ICD-10-CM | POA: Insufficient documentation

## 2016-02-09 DIAGNOSIS — Z3A36 36 weeks gestation of pregnancy: Secondary | ICD-10-CM

## 2016-02-09 DIAGNOSIS — O26899 Other specified pregnancy related conditions, unspecified trimester: Secondary | ICD-10-CM

## 2016-02-09 DIAGNOSIS — F258 Other schizoaffective disorders: Secondary | ICD-10-CM

## 2016-02-09 DIAGNOSIS — O99513 Diseases of the respiratory system complicating pregnancy, third trimester: Secondary | ICD-10-CM | POA: Diagnosis not present

## 2016-02-09 DIAGNOSIS — Z148 Genetic carrier of other disease: Secondary | ICD-10-CM

## 2016-02-09 DIAGNOSIS — O26893 Other specified pregnancy related conditions, third trimester: Secondary | ICD-10-CM | POA: Insufficient documentation

## 2016-02-09 DIAGNOSIS — Z6791 Unspecified blood type, Rh negative: Secondary | ICD-10-CM | POA: Diagnosis not present

## 2016-02-10 ENCOUNTER — Ambulatory Visit (INDEPENDENT_AMBULATORY_CARE_PROVIDER_SITE_OTHER): Payer: Medicare Other | Admitting: Family Medicine

## 2016-02-10 VITALS — BP 103/54 | HR 97 | Wt 192.6 lb

## 2016-02-10 DIAGNOSIS — O0990 Supervision of high risk pregnancy, unspecified, unspecified trimester: Secondary | ICD-10-CM

## 2016-02-10 DIAGNOSIS — R000 Tachycardia, unspecified: Secondary | ICD-10-CM

## 2016-02-10 NOTE — Progress Notes (Signed)
   PRENATAL VISIT NOTE  Subjective:  [name] is a 26 y.o. G2P0010 at [redacted]w[redacted]d being seen today for ongoing prenatal care.  She is currently monitored for the following issues for this high-risk pregnancy and has Autism spectrum disorder; Schizoaffective disorder, bipolar type by history(HCC); Borderline personality disorder; PTSD (post-traumatic stress disorder); Asthma; Chronic migraine; Supervision of high risk pregnancy, antepartum; Seizure disorder ([place]); Seizure disorder in pregnancy, antepartum ([place]); Rh negative, antepartum; 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; and Tachycardia with heart rate 121-140 beats per minute on her problem list.  Patient reports no complaints.  Contractions: Not present. Vag. Bleeding: None.  Movement: Present. Denies leaking of fluid.   The following portions of the patient's history were reviewed and updated as appropriate: allergies, current medications, past family history, past medical history, past social history, past surgical history and problem list. Problem list updated.  Objective:   Vitals:   02/10/16 1016  BP: (!) 103/54  Pulse: 97  Weight: 192 lb 9.6 oz (87.4 kg)    Fetal Status: Fetal Heart Rate (bpm): 154 Fundal Height: 35 cm Movement: Present  Presentation: Vertex  General:  Alert, oriented and cooperative. Patient is in no acute distress.  Skin: Skin is warm and dry. No rash noted.   Cardiovascular: Normal heart rate noted  Respiratory: Normal respiratory effort, no problems with respiration noted  Abdomen: Soft, gravid, appropriate for gestational age. Pain/Pressure: Present     Pelvic:  Cervical exam deferred        Extremities: Normal range of motion.  Edema: None  Mental Status: Normal mood and affect. Normal behavior. Normal judgment and thought content.   Assessment and Plan:  Pregnancy: G2P0010 at [redacted]w[redacted]d  1. Supervision of high risk pregnancy, antepartum Continue prenatal care.--cultures next visit. Getting weekly BPP with  MFM--for u/s for growth next week   2. Tachycardia with heart rate 121-140 beats per minute Continue Labetalol  Preterm labor symptoms and general obstetric precautions including but not limited to vaginal bleeding, contractions, leaking of fluid and fetal movement were reviewed in detail with the patient. Please refer to After Visit Summary for other counseling recommendations.  Return in 1 week (on 02/17/2016).   Donnamae Jude, MD

## 2016-02-10 NOTE — Patient Instructions (Signed)
Third Trimester of Pregnancy The third trimester is from week 29 through week 40 (months 7 through 9). The third trimester is a time when the unborn baby (fetus) is growing rapidly. At the end of the ninth month, the fetus is about 20 inches in length and weighs 6-10 pounds. Body changes during your third trimester Your body goes through many changes during pregnancy. The changes vary from woman to woman. During the third trimester:  Your weight will continue to increase. You can expect to gain 25-35 pounds (11-16 kg) by the end of the pregnancy.  You may begin to get stretch marks on your hips, abdomen, and breasts.  You may urinate more often because the fetus is moving lower into your pelvis and pressing on your bladder.  You may develop or continue to have heartburn. This is caused by increased hormones that slow down muscles in the digestive tract.  You may develop or continue to have constipation because increased hormones slow digestion and cause the muscles that push waste through your intestines to relax.  You may develop hemorrhoids. These are swollen veins (varicose veins) in the rectum that can itch or be painful.  You may develop swollen, bulging veins (varicose veins) in your legs.  You may have increased body aches in the pelvis, back, or thighs. This is due to weight gain and increased hormones that are relaxing your joints.  You may have changes in your hair. These can include thickening of your hair, rapid growth, and changes in texture. Some women also have hair loss during or after pregnancy, or hair that feels dry or thin. Your hair will most likely return to normal after your baby is born.  Your breasts will continue to grow and they will continue to become tender. A yellow fluid (colostrum) may leak from your breasts. This is the first milk you are producing for your baby.  Your belly button may stick out.  You may notice more swelling in your hands, face, or  ankles.  You may have increased tingling or numbness in your hands, arms, and legs. The skin on your belly may also feel numb.  You may feel short of breath because of your expanding uterus.  You may have more problems sleeping. This can be caused by the size of your belly, increased need to urinate, and an increase in your body's metabolism.  You may notice the fetus "dropping," or moving lower in your abdomen.  You may have increased vaginal discharge.  Your cervix becomes thin and soft (effaced) near your due date. What to expect at prenatal visits You will have prenatal exams every 2 weeks until week 36. Then you will have weekly prenatal exams. During a routine prenatal visit:  You will be weighed to make sure you and the fetus are growing normally.  Your blood pressure will be taken.  Your abdomen will be measured to track your baby's growth.  The fetal heartbeat will be listened to.  Any test results from the previous visit will be discussed.  You may have a cervical check near your due date to see if you have effaced. At around 36 weeks, your health care provider will check your cervix. At the same time, your health care provider will also perform a test on the secretions of the vaginal tissue. This test is to determine if a type of bacteria, Group B streptococcus, is present. Your health care provider will explain this further. Your health care provider may ask you:    What your birth plan is.  How you are feeling.  If you are feeling the baby move.  If you have had any abnormal symptoms, such as leaking fluid, bleeding, severe headaches, or abdominal cramping.  If you are using any tobacco products, including cigarettes, chewing tobacco, and electronic cigarettes.  If you have any questions. Other tests or screenings that may be performed during your third trimester include:  Blood tests that check for low iron levels (anemia).  Fetal testing to check the health,  activity level, and growth of the fetus. Testing is done if you have certain medical conditions or if there are problems during the pregnancy.  Nonstress test (NST). This test checks the health of your baby to make sure there are no signs of problems, such as the baby not getting enough oxygen. During this test, a belt is placed around your belly. The baby is made to move, and its heart rate is monitored during movement. What is false labor? False labor is a condition in which you feel small, irregular tightenings of the muscles in the womb (contractions) that eventually go away. These are called Braxton Hicks contractions. Contractions may last for hours, days, or even weeks before true labor sets in. If contractions come at regular intervals, become more frequent, increase in intensity, or become painful, you should see your health care provider. What are the signs of labor?  Abdominal cramps.  Regular contractions that start at 10 minutes apart and become stronger and more frequent with time.  Contractions that start on the top of the uterus and spread down to the lower abdomen and back.  Increased pelvic pressure and dull back pain.  A watery or bloody mucus discharge that comes from the vagina.  Leaking of amniotic fluid. This is also known as your "water breaking." It could be a slow trickle or a gush. Let your doctor know if it has a color or strange odor. If you have any of these signs, call your health care provider right away, even if it is before your due date. Follow these instructions at home: Eating and drinking  Continue to eat regular, healthy meals.  Do not eat:  Raw meat or meat spreads.  Unpasteurized milk or cheese.  Unpasteurized juice.  Store-made salad.  Refrigerated smoked seafood.  Hot dogs or deli meat, unless they are piping hot.  More than 6 ounces of albacore tuna a week.  Shark, swordfish, king mackerel, or tile fish.  Store-made salads.  Raw  sprouts, such as mung bean or alfalfa sprouts.  Take prenatal vitamins as told by your health care provider.  Take 1000 mg of calcium daily as told by your health care provider.  If you develop constipation:  Take over-the-counter or prescription medicines.  Drink enough fluid to keep your urine clear or pale yellow.  Eat foods that are high in fiber, such as fresh fruits and vegetables, whole grains, and beans.  Limit foods that are high in fat and processed sugars, such as fried and sweet foods. Activity  Exercise only as directed by your health care provider. Healthy pregnant women should aim for 2 hours and 30 minutes of moderate exercise per week. If you experience any pain or discomfort while exercising, stop.  Avoid heavy lifting.  Do not exercise in extreme heat or humidity, or at high altitudes.  Wear low-heel, comfortable shoes.  Practice good posture.  Do not travel far distances unless it is absolutely necessary and only with the approval   of your health care provider.  Wear your seat belt at all times while in a car, on a bus, or on a plane.  Take frequent breaks and rest with your legs elevated if you have leg cramps or low back pain.  Do not use hot tubs, steam rooms, or saunas.  You may continue to have sex unless your health care provider tells you otherwise. Lifestyle  Do not use any products that contain nicotine or tobacco, such as cigarettes and e-cigarettes. If you need help quitting, ask your health care provider.  Do not drink alcohol.  Do not use any medicinal herbs or unprescribed drugs. These chemicals affect the formation and growth of the baby.  If you develop varicose veins:  Wear support pantyhose or compression stockings as told by your healthcare provider.  Elevate your feet for 15 minutes, 3-4 times a day.  Wear a supportive maternity bra to help with breast tenderness. General instructions  Take over-the-counter and prescription  medicines only as told by your health care provider. There are medicines that are either safe or unsafe to take during pregnancy.  Take warm sitz baths to soothe any pain or discomfort caused by hemorrhoids. Use hemorrhoid cream or witch hazel if your health care provider approves.  Avoid cat litter boxes and soil used by cats. These carry germs that can cause birth defects in the baby. If you have a cat, ask someone to clean the litter box for you.  To prepare for the arrival of your baby:  Take prenatal classes to understand, practice, and ask questions about the labor and delivery.  Make a trial run to the hospital.  Visit the hospital and tour the maternity area.  Arrange for maternity or paternity leave through employers.  Arrange for family and friends to take care of pets while you are in the hospital.  Purchase a rear-facing car seat and make sure you know how to install it in your car.  Pack your hospital bag.  Prepare the baby's nursery. Make sure to remove all pillows and stuffed animals from the baby's crib to prevent suffocation.  Visit your dentist if you have not gone during your pregnancy. Use a soft toothbrush to brush your teeth and be gentle when you floss.  Keep all prenatal follow-up visits as told by your health care provider. This is important. Contact a health care provider if:  You are unsure if you are in labor or if your water has broken.  You become dizzy.  You have mild pelvic cramps, pelvic pressure, or nagging pain in your abdominal area.  You have lower back pain.  You have persistent nausea, vomiting, or diarrhea.  You have an unusual or bad smelling vaginal discharge.  You have pain when you urinate. Get help right away if:  You have a fever.  You are leaking fluid from your vagina.  You have spotting or bleeding from your vagina.  You have severe abdominal pain or cramping.  You have rapid weight loss or weight gain.  You have  shortness of breath with chest pain.  You notice sudden or extreme swelling of your face, hands, ankles, feet, or legs.  Your baby makes fewer than 10 movements in 2 hours.  You have severe headaches that do not go away with medicine.  You have vision changes. Summary  The third trimester is from week 29 through week 40, months 7 through 9. The third trimester is a time when the unborn baby (fetus)   is growing rapidly.  During the third trimester, your discomfort may increase as you and your baby continue to gain weight. You may have abdominal, leg, and back pain, sleeping problems, and an increased need to urinate.  During the third trimester your breasts will keep growing and they will continue to become tender. A yellow fluid (colostrum) may leak from your breasts. This is the first milk you are producing for your baby.  False labor is a condition in which you feel small, irregular tightenings of the muscles in the womb (contractions) that eventually go away. These are called Braxton Hicks contractions. Contractions may last for hours, days, or even weeks before true labor sets in.  Signs of labor can include: abdominal cramps; regular contractions that start at 10 minutes apart and become stronger and more frequent with time; watery or bloody mucus discharge that comes from the vagina; increased pelvic pressure and dull back pain; and leaking of amniotic fluid. This information is not intended to replace advice given to you by your health care provider. Make sure you discuss any questions you have with your health care provider. Document Released: 01/16/2001 Document Revised: 06/30/2015 Document Reviewed: 03/25/2012 [employer] Interactive Patient Education  2017 [employer]   Breastfeeding Deciding to breastfeed is one of the best choices you can make for you and your baby. A change in hormones during pregnancy causes your breast tissue to grow and increases the number and size of your  milk ducts. These hormones also allow proteins, sugars, and fats from your blood supply to make breast milk in your milk-producing glands. Hormones prevent breast milk from being released before your baby is born as well as prompt milk flow after birth. Once breastfeeding has begun, thoughts of your baby, as well as his or her sucking or crying, can stimulate the release of milk from your milk-producing glands. Benefits of breastfeeding For Your Baby  Your first milk (colostrum) helps your baby's digestive system function better.  There are antibodies in your milk that help your baby fight off infections.  Your baby has a lower incidence of asthma, allergies, and sudden infant death syndrome.  The nutrients in breast milk are better for your baby than infant formulas and are designed uniquely for your baby's needs.  Breast milk improves your baby's brain development.  Your baby is less likely to develop other conditions, such as childhood obesity, asthma, or type 2 diabetes mellitus. For You  Breastfeeding helps to create a very special bond between you and your baby.  Breastfeeding is convenient. Breast milk is always available at the correct temperature and costs nothing.  Breastfeeding helps to burn calories and helps you lose the weight gained during pregnancy.  Breastfeeding makes your uterus contract to its prepregnancy size faster and slows bleeding (lochia) after you give birth.  Breastfeeding helps to lower your risk of developing type 2 diabetes mellitus, osteoporosis, and breast or ovarian cancer later in life. Signs that your baby is hungry Early Signs of Hunger  Increased alertness or activity.  Stretching.  Movement of the head from side to side.  Movement of the head and opening of the mouth when the corner of the mouth or cheek is stroked (rooting).  Increased sucking sounds, smacking lips, cooing, sighing, or squeaking.  Hand-to-mouth movements.  Increased  sucking of fingers or hands. Late Signs of Hunger  Fussing.  Intermittent crying. Extreme Signs of Hunger  Signs of extreme hunger will require calming and consoling before your baby will   be able to breastfeed successfully. Do not wait for the following signs of extreme hunger to occur before you initiate breastfeeding:  Restlessness.  A loud, strong cry.  Screaming. Breastfeeding basics  Breastfeeding Initiation  Find a comfortable place to sit or lie down, with your neck and back well supported.  Place a pillow or rolled up blanket under your baby to bring him or her to the level of your breast (if you are seated). Nursing pillows are specially designed to help support your arms and your baby while you breastfeed.  Make sure that your baby's abdomen is facing your abdomen.  Gently massage your breast. With your fingertips, massage from your chest wall toward your nipple in a circular motion. This encourages milk flow. You may need to continue this action during the feeding if your milk flows slowly.  Support your breast with 4 fingers underneath and your thumb above your nipple. Make sure your fingers are well away from your nipple and your baby's mouth.  Stroke your baby's lips gently with your finger or nipple.  When your baby's mouth is open wide enough, quickly bring your baby to your breast, placing your entire nipple and as much of the colored area around your nipple (areola) as possible into your baby's mouth.  More areola should be visible above your baby's upper lip than below the lower lip.  Your baby's tongue should be between his or her lower gum and your breast.  Ensure that your baby's mouth is correctly positioned around your nipple (latched). Your baby's lips should create a seal on your breast and be turned out (everted).  It is common for your baby to suck about 2-3 minutes in order to start the flow of breast milk. Latching  Teaching your baby how to latch  on to your breast properly is very important. An improper latch can cause nipple pain and decreased milk supply for you and poor weight gain in your baby. Also, if your baby is not latched onto your nipple properly, he or she may swallow some air during feeding. This can make your baby fussy. Burping your baby when you switch breasts during the feeding can help to get rid of the air. However, teaching your baby to latch on properly is still the best way to prevent fussiness from swallowing air while breastfeeding. Signs that your baby has successfully latched on to your nipple:  Silent tugging or silent sucking, without causing you pain.  Swallowing heard between every 3-4 sucks.  Muscle movement above and in front of his or her ears while sucking. Signs that your baby has not successfully latched on to nipple:  Sucking sounds or smacking sounds from your baby while breastfeeding.  Nipple pain. If you think your baby has not latched on correctly, slip your finger into the corner of your baby's mouth to break the suction and place it between your baby's gums. Attempt breastfeeding initiation again. Signs of Successful Breastfeeding  Signs from your baby:  A gradual decrease in the number of sucks or complete cessation of sucking.  Falling asleep.  Relaxation of his or her body.  Retention of a small amount of milk in his or her mouth.  Letting go of your breast by himself or herself. Signs from you:  Breasts that have increased in firmness, weight, and size 1-3 hours after feeding.  Breasts that are softer immediately after breastfeeding.  Increased milk volume, as well as a change in milk consistency and color by   the fifth day of breastfeeding.  Nipples that are not sore, cracked, or bleeding. Signs That Your Baby is Getting Enough Milk  Wetting at least 1-2 diapers during the first 24 hours after birth.  Wetting at least 5-6 diapers every 24 hours for the first week after  birth. The urine should be clear or pale yellow by 5 days after birth.  Wetting 6-8 diapers every 24 hours as your baby continues to grow and develop.  At least 3 stools in a 24-hour period by age 5 days. The stool should be soft and yellow.  At least 3 stools in a 24-hour period by age 7 days. The stool should be seedy and yellow.  No loss of weight greater than 10% of birth weight during the first 3 days of age.  Average weight gain of 4-7 ounces (113-198 g) per week after age 4 days.  Consistent daily weight gain by age 5 days, without weight loss after the age of 2 weeks. After a feeding, your baby may spit up a small amount. This is common. Breastfeeding frequency and duration Frequent feeding will help you make more milk and can prevent sore nipples and breast engorgement. Breastfeed when you feel the need to reduce the fullness of your breasts or when your baby shows signs of hunger. This is called "breastfeeding on demand." Avoid introducing a pacifier to your baby while you are working to establish breastfeeding (the first 4-6 weeks after your baby is born). After this time you may choose to use a pacifier. Research has shown that pacifier use during the first year of a baby's life decreases the risk of sudden infant death syndrome (SIDS). Allow your baby to feed on each breast as long as he or she wants. Breastfeed until your baby is finished feeding. When your baby unlatches or falls asleep while feeding from the first breast, offer the second breast. Because newborns are often sleepy in the first few weeks of life, you may need to awaken your baby to get him or her to feed. Breastfeeding times will vary from baby to baby. However, the following rules can serve as a guide to help you ensure that your baby is properly fed:  Newborns (babies 4 weeks of age or younger) may breastfeed every 1-3 hours.  Newborns should not go longer than 3 hours during the day or 5 hours during the night  without breastfeeding.  You should breastfeed your baby a minimum of 8 times in a 24-hour period until you begin to introduce solid foods to your baby at around 6 months of age. Breast milk pumping Pumping and storing breast milk allows you to ensure that your baby is exclusively fed your breast milk, even at times when you are unable to breastfeed. This is especially important if you are going back to work while you are still breastfeeding or when you are not able to be present during feedings. Your lactation consultant can give you guidelines on how long it is safe to store breast milk. A breast pump is a machine that allows you to pump milk from your breast into a sterile bottle. The pumped breast milk can then be stored in a refrigerator or freezer. Some breast pumps are operated by hand, while others use electricity. Ask your lactation consultant which type will work best for you. Breast pumps can be purchased, but some hospitals and breastfeeding support groups lease breast pumps on a monthly basis. A lactation consultant can teach you how to   hand express breast milk, if you prefer not to use a pump. Caring for your breasts while you breastfeed Nipples can become dry, cracked, and sore while breastfeeding. The following recommendations can help keep your breasts moisturized and healthy:  Avoid using soap on your nipples.  Wear a supportive bra. Although not required, special nursing bras and tank tops are designed to allow access to your breasts for breastfeeding without taking off your entire bra or top. Avoid wearing underwire-style bras or extremely tight bras.  Air dry your nipples for 3-4minutes after each feeding.  Use only cotton bra pads to absorb leaked breast milk. Leaking of breast milk between feedings is normal.  Use lanolin on your nipples after breastfeeding. Lanolin helps to maintain your skin's normal moisture barrier. If you use pure lanolin, you do not need to wash it off  before feeding your baby again. Pure lanolin is not toxic to your baby. You may also hand express a few drops of breast milk and gently massage that milk into your nipples and allow the milk to air dry. In the first few weeks after giving birth, some women experience extremely full breasts (engorgement). Engorgement can make your breasts feel heavy, warm, and tender to the touch. Engorgement peaks within 3-5 days after you give birth. The following recommendations can help ease engorgement:  Completely empty your breasts while breastfeeding or pumping. You may want to start by applying warm, moist heat (in the shower or with warm water-soaked hand towels) just before feeding or pumping. This increases circulation and helps the milk flow. If your baby does not completely empty your breasts while breastfeeding, pump any extra milk after he or she is finished.  Wear a snug bra (nursing or regular) or tank top for 1-2 days to signal your body to slightly decrease milk production.  Apply ice packs to your breasts, unless this is too uncomfortable for you.  Make sure that your baby is latched on and positioned properly while breastfeeding. If engorgement persists after 48 hours of following these recommendations, contact your health care provider or a lactation consultant. Overall health care recommendations while breastfeeding  Eat healthy foods. Alternate between meals and snacks, eating 3 of each per day. Because what you eat affects your breast milk, some of the foods may make your baby more irritable than usual. Avoid eating these foods if you are sure that they are negatively affecting your baby.  Drink milk, fruit juice, and water to satisfy your thirst (about 10 glasses a day).  Rest often, relax, and continue to take your prenatal vitamins to prevent fatigue, stress, and anemia.  Continue breast self-awareness checks.  Avoid chewing and smoking tobacco. Chemicals from cigarettes that pass  into breast milk and exposure to secondhand smoke may harm your baby.  Avoid alcohol and drug use, including marijuana. Some medicines that may be harmful to your baby can pass through breast milk. It is important to ask your health care provider before taking any medicine, including all over-the-counter and prescription medicine as well as vitamin and herbal supplements. It is possible to become pregnant while breastfeeding. If birth control is desired, ask your health care provider about options that will be safe for your baby. Contact a health care provider if:  You feel like you want to stop breastfeeding or have become frustrated with breastfeeding.  You have painful breasts or nipples.  Your nipples are cracked or bleeding.  Your breasts are red, tender, or warm.  You have   a swollen area on either breast.  You have a fever or chills.  You have nausea or vomiting.  You have drainage other than breast milk from your nipples.  Your breasts do not become full before feedings by the fifth day after you give birth.  You feel sad and depressed.  Your baby is too sleepy to eat well.  Your baby is having trouble sleeping.  Your baby is wetting less than 3 diapers in a 24-hour period.  Your baby has less than 3 stools in a 24-hour period.  Your baby's skin or the white part of his or her eyes becomes yellow.  Your baby is not gaining weight by 5 days of age. Get help right away if:  Your baby is overly tired (lethargic) and does not want to wake up and feed.  Your baby develops an unexplained fever. This information is not intended to replace advice given to you by your health care provider. Make sure you discuss any questions you have with your health care provider. Document Released: 01/22/2005 Document Revised: 07/06/2015 Document Reviewed: 07/16/2012 Elsevier Interactive Patient Education  2017 Elsevier Inc.  

## 2016-02-12 ENCOUNTER — Encounter (HOSPITAL_COMMUNITY): Payer: Self-pay

## 2016-02-12 ENCOUNTER — Inpatient Hospital Stay (HOSPITAL_COMMUNITY)
Admission: AD | Admit: 2016-02-12 | Discharge: 2016-02-12 | Disposition: A | Discharge status: Home / Self Care | Payer: PPO | Source: Ambulatory Visit | Attending: Obstetrics & Gynecology | Admitting: Obstetrics & Gynecology

## 2016-02-12 DIAGNOSIS — O26899 Other specified pregnancy related conditions, unspecified trimester: Secondary | ICD-10-CM

## 2016-02-12 DIAGNOSIS — J111 Influenza due to unidentified influenza virus with other respiratory manifestations: Secondary | ICD-10-CM

## 2016-02-12 DIAGNOSIS — Z820 Family history of epilepsy and other diseases of the nervous system: Secondary | ICD-10-CM | POA: Insufficient documentation

## 2016-02-12 DIAGNOSIS — Z91030 Bee allergy status: Secondary | ICD-10-CM | POA: Diagnosis not present

## 2016-02-12 DIAGNOSIS — F840 Autistic disorder: Secondary | ICD-10-CM | POA: Diagnosis not present

## 2016-02-12 DIAGNOSIS — O99613 Diseases of the digestive system complicating pregnancy, third trimester: Secondary | ICD-10-CM | POA: Insufficient documentation

## 2016-02-12 DIAGNOSIS — Z825 Family history of asthma and other chronic lower respiratory diseases: Secondary | ICD-10-CM | POA: Diagnosis not present

## 2016-02-12 DIAGNOSIS — K219 Gastro-esophageal reflux disease without esophagitis: Secondary | ICD-10-CM | POA: Diagnosis not present

## 2016-02-12 DIAGNOSIS — Z885 Allergy status to narcotic agent status: Secondary | ICD-10-CM | POA: Insufficient documentation

## 2016-02-12 DIAGNOSIS — Z818 Family history of other mental and behavioral disorders: Secondary | ICD-10-CM | POA: Diagnosis not present

## 2016-02-12 DIAGNOSIS — Z91013 Allergy to seafood: Secondary | ICD-10-CM | POA: Insufficient documentation

## 2016-02-12 DIAGNOSIS — G250 Essential tremor: Secondary | ICD-10-CM | POA: Diagnosis not present

## 2016-02-12 DIAGNOSIS — Z888 Allergy status to other drugs, medicaments and biological substances status: Secondary | ICD-10-CM | POA: Diagnosis not present

## 2016-02-12 DIAGNOSIS — Z87891 Personal history of nicotine dependence: Secondary | ICD-10-CM | POA: Insufficient documentation

## 2016-02-12 DIAGNOSIS — O98513 Other viral diseases complicating pregnancy, third trimester: Secondary | ICD-10-CM | POA: Insufficient documentation

## 2016-02-12 DIAGNOSIS — Z91018 Allergy to other foods: Secondary | ICD-10-CM | POA: Insufficient documentation

## 2016-02-12 DIAGNOSIS — Z3A35 35 weeks gestation of pregnancy: Secondary | ICD-10-CM | POA: Diagnosis not present

## 2016-02-12 DIAGNOSIS — O26893 Other specified pregnancy related conditions, third trimester: Secondary | ICD-10-CM | POA: Diagnosis not present

## 2016-02-12 DIAGNOSIS — O99343 Other mental disorders complicating pregnancy, third trimester: Secondary | ICD-10-CM | POA: Diagnosis not present

## 2016-02-12 DIAGNOSIS — Z6791 Unspecified blood type, Rh negative: Secondary | ICD-10-CM

## 2016-02-12 DIAGNOSIS — B349 Viral infection, unspecified: Secondary | ICD-10-CM | POA: Insufficient documentation

## 2016-02-12 DIAGNOSIS — Z148 Genetic carrier of other disease: Secondary | ICD-10-CM

## 2016-02-12 LAB — URINALYSIS, ROUTINE W REFLEX MICROSCOPIC
Bilirubin Urine: NEGATIVE
Glucose, UA: NEGATIVE mg/dL
HGB URINE DIPSTICK: NEGATIVE
KETONES UR: NEGATIVE mg/dL
LEUKOCYTES UA: NEGATIVE
Nitrite: NEGATIVE
PROTEIN: NEGATIVE mg/dL
Specific Gravity, Urine: 1.008 (ref 1.005–1.030)
pH: 6 (ref 5.0–8.0)

## 2016-02-12 LAB — INFLUENZA PANEL BY PCR (TYPE A & B)
Influenza A By PCR: NEGATIVE
Influenza B By PCR: NEGATIVE

## 2016-02-12 MED ORDER — OSELTAMIVIR PHOSPHATE 75 MG PO CAPS: 75.0000 mg | ORAL_CAPSULE | Freq: Two times a day (BID) | ORAL | 0 refills | Status: DC

## 2016-02-12 NOTE — MAU Provider Note (Signed)
History   G2P0010 @ 35.5 wks in with c/o cough, sore throat, malise, started this morning. States BF had flu one week ago. Pt states she refused flu shot.  CSN: [id number]  Arrival date & time 02/12/16  1549   None     No chief complaint on file.   HPI  Past Medical History:  Diagnosis Date  . Acid reflux   . Asthma   . Autism   . 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 Chronic constipation   . Depression   . Drug-seeking behavior   . Essential tremor   . Personality disorder   . Schizo-affective psychosis ([place])   . Schizoaffective disorder, bipolar type ([place])   . Seizures [facility])    Last seizure November 2017    Past Surgical History:  Procedure Laterality Date  . MOUTH SURGERY      Family History  Problem Relation Age of Onset  . Mental illness Father   . Asthma Father   . PDD Brother   . Seizures Brother     Social History  Substance Use Topics  . Smoking status: Former Smoker    Packs/day: 0.00  . Smokeless tobacco: Never Used     Comment: Smoked for 2  years age [age]  . Alcohol use 0.6 oz/week    1 Standard drinks or equivalent per week     Comment: "not with preg"    OB History    Gravida Para Term Preterm AB Living   2       1     SAB TAB Ectopic Multiple Live Births   1              Review of Systems  Constitutional: Positive for chills.  HENT: Positive for rhinorrhea and sore throat.   Eyes: Negative.   Respiratory: Positive for cough.   Cardiovascular: Negative.   Gastrointestinal: Negative.   Endocrine: Negative.   Genitourinary: Negative.   Musculoskeletal: Negative.   Allergic/Immunologic: Negative.   Neurological: Positive for headaches.  Hematological: Negative.   Psychiatric/Behavioral: Negative.     Allergies  Bee venom; Coconut flavor; Geodon [ziprasidone hcl]; Haloperidol and related; Lithium; Oxycodone; Seroquel [quetiapine fumarate]; Shellfish allergy; Percocet [oxycodone-acetaminophen]; Phenergan [promethazine hcl]; Prilosec  [omeprazole]; and Tegretol [carbamazepine]  Home Medications    BP 133/73 (BP Location: Right Arm)   Pulse 105   Temp 98.5 F (36.9 C) (Oral)   Resp 19   Ht 5\' 5"  (1.651 m)   Wt 198 lb 9.6 oz (90.1 kg)   LMP 06/07/2015   SpO2 98%   BMI 33.05 kg/m   Physical Exam  Constitutional: She is oriented to person, place, and time. She appears well-developed and well-nourished.  HENT:  Head: Normocephalic.  Eyes: Pupils are equal, round, and reactive to light.  Neck: Normal range of motion.  Cardiovascular: Normal rate, regular rhythm, normal heart sounds and intact distal pulses.   Pulmonary/Chest: Effort normal and breath sounds normal.  Abdominal: Soft. Bowel sounds are normal.  Genitourinary: Uterus normal.  Musculoskeletal: Normal range of motion.  Neurological: She is alert and oriented to person, place, and time. She has normal reflexes.  Skin: Skin is warm and dry.  Psychiatric: She has a normal mood and affect. Her behavior is normal. Judgment and thought content normal.    MAU Course  Procedures (including critical care time)  Labs Reviewed  URINALYSIS, ROUTINE W REFLEX MICROSCOPIC  INFLUENZA PANEL BY PCR (TYPE A & B, H1N1)   No  results found.   No diagnosis found.    MDM  Influenza swab, VSS, given the fact pt's symptoms started this morning. Will start tamiflu and d/c home to f/u PRN. FHR pattern reassuring.

## 2016-02-12 NOTE — MAU Note (Signed)
Pt presents to MAU with complaints of sore throat since this morning. States her husband tested positive for flu on Thursday. Denies any vaginal bleeding or abnormal discharge

## 2016-02-12 NOTE — Discharge Instructions (Signed)
Influenza, Adult Influenza ("the flu") is an infection in the lungs, nose, and throat (respiratory tract). It is caused by a virus. The flu causes many common cold symptoms, as well as a high fever and body aches. It can make you feel very sick. The flu spreads easily from person to person (is contagious). Getting a flu shot (influenza vaccination) every year is the best way to prevent the flu. Follow these instructions at home:  Take over-the-counter and prescription medicines only as told by your doctor.  Use a cool mist humidifier to add moisture (humidity) to the air in your home. This can make it easier to breathe.  Rest as needed.  Drink enough fluid to keep your pee (urine) clear or pale yellow.  Cover your mouth and nose when you cough or sneeze.  Wash your hands with soap and water often, especially after you cough or sneeze. If you cannot use soap and water, use hand sanitizer.  Stay home from work or school as told by your doctor. Unless you are visiting your doctor, try to avoid leaving home until your fever has been gone for 24 hours without the use of medicine.  Keep all follow-up visits as told by your doctor. This is important. How is this prevented?  Getting a yearly (annual) flu shot is the best way to avoid getting the flu. You may get the flu shot in late summer, fall, or winter. Ask your doctor when you should get your flu shot.  Wash your hands often or use hand sanitizer often.  Avoid contact with people who are sick during cold and flu season.  Eat healthy foods.  Drink plenty of fluids.  Get enough sleep.  Exercise regularly. Contact a doctor if:  You get new symptoms.  You have: ? Chest pain. ? Watery poop (diarrhea). ? A fever.  Your cough gets worse.  You start to have more mucus.  You feel sick to your stomach (nauseous).  You throw up (vomit). Get help right away if:  You start to be short of breath or have trouble breathing.  Your  skin or nails turn a bluish color.  You have very bad pain or stiffness in your neck.  You get a sudden headache.  You get sudden pain in your face or ear.  You cannot stop throwing up. This information is not intended to replace advice given to you by your health care provider. Make sure you discuss any questions you have with your health care provider. Document Released: 11/01/2007 Document Revised: 06/30/2015 Document Reviewed: 11/16/2014 Elsevier Interactive Patient Education  2017 Elsevier Inc.  

## 2016-02-16 ENCOUNTER — Ambulatory Visit (HOSPITAL_COMMUNITY)
Admission: RE | Admit: 2016-02-16 | Discharge: 2016-02-16 | Disposition: A | Discharge status: Home / Self Care | Payer: PPO | Source: Ambulatory Visit | Attending: Obstetrics and Gynecology | Admitting: Obstetrics and Gynecology

## 2016-02-16 ENCOUNTER — Encounter (HOSPITAL_COMMUNITY): Payer: Self-pay

## 2016-02-16 ENCOUNTER — Other Ambulatory Visit (HOSPITAL_COMMUNITY): Payer: Self-pay | Admitting: Obstetrics and Gynecology

## 2016-02-16 DIAGNOSIS — Z6791 Unspecified blood type, Rh negative: Secondary | ICD-10-CM | POA: Diagnosis not present

## 2016-02-16 DIAGNOSIS — O99513 Diseases of the respiratory system complicating pregnancy, third trimester: Secondary | ICD-10-CM | POA: Diagnosis not present

## 2016-02-16 DIAGNOSIS — O26899 Other specified pregnancy related conditions, unspecified trimester: Secondary | ICD-10-CM

## 2016-02-16 DIAGNOSIS — O26893 Other specified pregnancy related conditions, third trimester: Secondary | ICD-10-CM | POA: Diagnosis not present

## 2016-02-16 DIAGNOSIS — O99353 Diseases of the nervous system complicating pregnancy, third trimester: Principal | ICD-10-CM

## 2016-02-16 DIAGNOSIS — Z3A36 36 weeks gestation of pregnancy: Secondary | ICD-10-CM | POA: Insufficient documentation

## 2016-02-16 DIAGNOSIS — J45909 Unspecified asthma, uncomplicated: Secondary | ICD-10-CM | POA: Diagnosis not present

## 2016-02-16 DIAGNOSIS — G40909 Epilepsy, unspecified, not intractable, without status epilepticus: Secondary | ICD-10-CM

## 2016-02-16 DIAGNOSIS — Z148 Genetic carrier of other disease: Secondary | ICD-10-CM

## 2016-02-16 DIAGNOSIS — F259 Schizoaffective disorder, unspecified: Secondary | ICD-10-CM

## 2016-02-16 DIAGNOSIS — Q992 Fragile X chromosome: Secondary | ICD-10-CM

## 2016-02-17 ENCOUNTER — Other Ambulatory Visit (HOSPITAL_COMMUNITY): Payer: Self-pay | Admitting: *Deleted

## 2016-02-17 DIAGNOSIS — O99353 Diseases of the nervous system complicating pregnancy, third trimester: Principal | ICD-10-CM

## 2016-02-17 DIAGNOSIS — G40909 Epilepsy, unspecified, not intractable, without status epilepticus: Secondary | ICD-10-CM

## 2016-02-19 ENCOUNTER — Encounter (HOSPITAL_COMMUNITY): Payer: Self-pay

## 2016-02-19 ENCOUNTER — Inpatient Hospital Stay (HOSPITAL_COMMUNITY)
Admission: AD | Admit: 2016-02-19 | Discharge: 2016-02-19 | Disposition: A | Discharge status: Home / Self Care | Payer: PPO | Source: Ambulatory Visit | Attending: Obstetrics & Gynecology | Admitting: Obstetrics & Gynecology

## 2016-02-19 DIAGNOSIS — Z148 Genetic carrier of other disease: Secondary | ICD-10-CM

## 2016-02-19 DIAGNOSIS — Z6791 Unspecified blood type, Rh negative: Secondary | ICD-10-CM

## 2016-02-19 DIAGNOSIS — Z3A36 36 weeks gestation of pregnancy: Secondary | ICD-10-CM | POA: Insufficient documentation

## 2016-02-19 DIAGNOSIS — M549 Dorsalgia, unspecified: Secondary | ICD-10-CM | POA: Insufficient documentation

## 2016-02-19 DIAGNOSIS — O26893 Other specified pregnancy related conditions, third trimester: Secondary | ICD-10-CM | POA: Diagnosis not present

## 2016-02-19 DIAGNOSIS — O26899 Other specified pregnancy related conditions, unspecified trimester: Secondary | ICD-10-CM

## 2016-02-19 NOTE — MAU Note (Signed)
Urine sent to lab 

## 2016-02-19 NOTE — MAU Note (Signed)
Pt presents to MAU with back pain and pressure that started at 1930. Pt reports pain comes q 5 min. Denies bleeding or leaking of fluid. Reports good fetal movement.

## 2016-02-19 NOTE — Discharge Instructions (Signed)
Braxton Hicks Contractions °Contractions of the uterus can occur throughout pregnancy. Contractions are not always a sign that you are in labor.  °WHAT ARE BRAXTON HICKS CONTRACTIONS?  °Contractions that occur before labor are called Braxton Hicks contractions, or false labor. Toward the end of pregnancy (32-34 weeks), these contractions can develop more often and may become more forceful. This is not true labor because these contractions do not result in opening (dilatation) and thinning of the cervix. They are sometimes difficult to tell apart from true labor because these contractions can be forceful and people have different pain tolerances. You should not feel embarrassed if you go to the hospital with false labor. Sometimes, the only way to tell if you are in true labor is for your health care provider to look for changes in the cervix. °If there are no prenatal problems or other health problems associated with the pregnancy, it is completely safe to be sent home with false labor and await the onset of true labor. °HOW CAN YOU TELL THE DIFFERENCE BETWEEN TRUE AND FALSE LABOR? °False Labor  °· The contractions of false labor are usually shorter and not as hard as those of true labor.   °· The contractions are usually irregular.   °· The contractions are often felt in the front of the lower abdomen and in the groin.   °· The contractions may go away when you walk around or change positions while lying down.   °· The contractions get weaker and are shorter lasting as time goes on.   °· The contractions do not usually become progressively stronger, regular, and closer together as with true labor.   °True Labor  °· Contractions in true labor last 30-70 seconds, become very regular, usually become more intense, and increase in frequency.   °· The contractions do not go away with walking.   °· The discomfort is usually felt in the top of the uterus and spreads to the lower abdomen and low back.   °· True labor can be  determined by your health care provider with an exam. This will show that the cervix is dilating and getting thinner.   °WHAT TO REMEMBER °· Keep up with your usual exercises and follow other instructions given by your health care provider.   °· Take medicines as directed by your health care provider.   °· Keep your regular prenatal appointments.   °· Eat and drink lightly if you think you are going into labor.   °· If Braxton Hicks contractions are making you uncomfortable:   °¨ Change your position from lying down or resting to walking, or from walking to resting.   °¨ Sit and rest in a tub of warm water.   °¨ Drink 2-3 glasses of water. Dehydration may cause these contractions.   °¨ Do slow and deep breathing several times an hour.   °WHEN SHOULD I SEEK IMMEDIATE MEDICAL CARE? °Seek immediate medical care if: °· Your contractions become stronger, more regular, and closer together.   °· You have fluid leaking or gushing from your vagina.   °· You have a fever.   °· You pass blood-tinged mucus.   °· You have vaginal bleeding.   °· You have continuous abdominal pain.   °· You have low back pain that you never had before.   °· You feel your baby's head pushing down and causing pelvic pressure.   °· Your baby is not moving as much as it used to.   °This information is not intended to replace advice given to you by your health care provider. Make sure you discuss any questions you have with your health care   provider. °Document Released: 01/22/2005 Document Revised: 05/16/2015 Document Reviewed: 11/03/2012 °Elsevier Interactive Patient Education © 2017 Elsevier Inc. ° °

## 2016-02-21 ENCOUNTER — Other Ambulatory Visit (HOSPITAL_COMMUNITY)
Admission: RE | Admit: 2016-02-21 | Discharge: 2016-02-21 | Disposition: A | Discharge status: Home / Self Care | Payer: PPO | Source: Ambulatory Visit | Attending: Obstetrics and Gynecology | Admitting: Obstetrics and Gynecology

## 2016-02-21 ENCOUNTER — Ambulatory Visit (INDEPENDENT_AMBULATORY_CARE_PROVIDER_SITE_OTHER): Payer: PPO | Admitting: Obstetrics and Gynecology

## 2016-02-21 VITALS — BP 115/74 | HR 114 | Wt 199.0 lb

## 2016-02-21 DIAGNOSIS — O0990 Supervision of high risk pregnancy, unspecified, unspecified trimester: Secondary | ICD-10-CM

## 2016-02-21 DIAGNOSIS — Z113 Encounter for screening for infections with a predominantly sexual mode of transmission: Secondary | ICD-10-CM

## 2016-02-21 DIAGNOSIS — Z6791 Unspecified blood type, Rh negative: Secondary | ICD-10-CM

## 2016-02-21 DIAGNOSIS — Z148 Genetic carrier of other disease: Secondary | ICD-10-CM

## 2016-02-21 DIAGNOSIS — R000 Tachycardia, unspecified: Secondary | ICD-10-CM

## 2016-02-21 DIAGNOSIS — O26899 Other specified pregnancy related conditions, unspecified trimester: Secondary | ICD-10-CM

## 2016-02-21 DIAGNOSIS — O99350 Diseases of the nervous system complicating pregnancy, unspecified trimester: Secondary | ICD-10-CM

## 2016-02-21 DIAGNOSIS — F250 Schizoaffective disorder, bipolar type: Secondary | ICD-10-CM

## 2016-02-21 DIAGNOSIS — G40909 Epilepsy, unspecified, not intractable, without status epilepticus: Secondary | ICD-10-CM

## 2016-02-21 LAB — OB RESULTS CONSOLE GBS: GBS: NEGATIVE

## 2016-02-21 LAB — OB RESULTS CONSOLE GC/CHLAMYDIA: Gonorrhea: NEGATIVE

## 2016-02-21 NOTE — Patient Instructions (Signed)
Vaginal Delivery Vaginal delivery means that you will give birth by pushing your baby out of your birth canal (vagina). A team of health care providers will help you before, during, and after vaginal delivery. Birth experiences are unique for every woman and every pregnancy, and birth experiences vary depending on where you choose to give birth. What should I do to prepare for my baby's birth? Before your baby is born, it is important to talk with your health care provider about:  Your labor and delivery preferences. These may include:  Medicines that you may be given.  How you will manage your pain. This might include non-medical pain relief techniques or injectable pain relief such as epidural analgesia.  How you and your baby will be monitored during labor and delivery.  Who may be in the labor and delivery room with you.  Your feelings about surgical delivery of your baby (cesarean delivery, or C-section) if this becomes necessary.  Your feelings about receiving donated blood through an IV tube (blood transfusion) if this becomes necessary.  Whether you are able:  To take pictures or videos of the birth.  To eat during labor and delivery.  To move around, walk, or change positions during labor and delivery.  What to expect after your baby is born, such as:  Whether delayed umbilical cord clamping and cutting is offered.  Who will care for your baby right after birth.  Medicines or tests that may be recommended for your baby.  Whether breastfeeding is supported in your hospital or birth center.  How long you will be in the hospital or birth center.  How any medical conditions you have may affect your baby or your labor and delivery experience. To prepare for your baby's birth, you should also:  Attend all of your health care visits before delivery (prenatal visits) as recommended by your health care provider. This is important.  Prepare your home for your baby's  arrival. Make sure that you have:  Diapers.  Baby clothing.  Feeding equipment.  Safe sleeping arrangements for you and your baby.  Install a car seat in your vehicle. Have your car seat checked by a certified car seat installer to make sure that it is installed safely.  Think about who will help you with your new baby at home for at least the first several weeks after delivery. What can I expect when I arrive at the birth center or hospital? Once you are in labor and have been admitted into the hospital or birth center, your health care provider may:  Review your pregnancy history and any concerns you have.  Insert an IV tube into one of your veins. This is used to give you fluids and medicines.  Check your blood pressure, pulse, temperature, and heart rate (vital signs).  Check whether your bag of water (amniotic sac) has broken (ruptured).  Talk with you about your birth plan and discuss pain control options. Monitoring  Your health care provider may monitor your contractions (uterine monitoring) and your baby's heart rate (fetal monitoring). You may need to be monitored:  Often, but not continuously (intermittently).  All the time or for long periods at a time (continuously). Continuous monitoring may be needed if:  You are taking certain medicines, such as medicine to relieve pain or make your contractions stronger.  You have pregnancy or labor complications. Monitoring may be done by:  Placing a special stethoscope or a handheld monitoring device on your abdomen to check your baby's   heartbeat, and feeling your abdomen for contractions. This method of monitoring does not continuously record your baby's heartbeat or your contractions.  Placing monitors on your abdomen (external monitors) to record your baby's heartbeat and the frequency and length of contractions. You may not have to wear external monitors all the time.  Placing monitors inside of your uterus (internal  monitors) to record your baby's heartbeat and the frequency, length, and strength of your contractions.  Your health care provider may use internal monitors if he or she needs more information about the strength of your contractions or your baby's heart rate.  Internal monitors are put in place by passing a thin, flexible wire through your vagina and into your uterus. Depending on the type of monitor, it may remain in your uterus or on your baby's head until birth.  Your health care provider will discuss the benefits and risks of internal monitoring with you and will ask for your permission before inserting the monitors.  Telemetry. This is a type of continuous monitoring that can be done with external or internal monitors. Instead of having to stay in bed, you are able to move around during telemetry. Ask your health care provider if telemetry is an option for you. Physical exam  Your health care provider may perform a physical exam. This may include:  Checking whether your baby is positioned:  With the head toward your vagina (head-down). This is most common.  With the head toward the top of your uterus (head-up or breech). If your baby is in a breech position, your health care provider may try to turn your baby to a head-down position so you can deliver vaginally. If it does not seem that your baby can be born vaginally, your provider may recommend surgery to deliver your baby. In rare cases, you may be able to deliver vaginally if your baby is head-up (breech delivery).  Lying sideways (transverse). Babies that are lying sideways cannot be delivered vaginally.  Checking your cervix to determine:  Whether it is thinning out (effacing).  Whether it is opening up (dilating).  How low your baby has moved into your birth canal. What are the three stages of labor and delivery?   Normal labor and delivery is divided into the following three stages: Stage 1   Stage 1 is the longest stage  of labor, and it can last for hours or days. Stage 1 includes:  Early labor. This is when contractions may be irregular, or regular and mild. Generally, early labor contractions are more than 10 minutes apart.  Active labor. This is when contractions get longer, more regular, more frequent, and more intense.  The transition phase. This is when contractions happen very close together, are very intense, and may last longer than during any other part of labor.  Contractions generally feel mild, infrequent, and irregular at first. They get stronger, more frequent (about every 2-3 minutes), and more regular as you progress from early labor through active labor and transition.  Many women progress through stage 1 naturally, but you may need help to continue making progress. If this happens, your health care provider may talk with you about:  Rupturing your amniotic sac if it has not ruptured yet.  Giving you medicine to help make your contractions stronger and more frequent.  Stage 1 ends when your cervix is completely dilated to 4 inches (10 cm) and completely effaced. This happens at the end of the transition phase. Stage 2   Once your   cervix is completely effaced and dilated to 4 inches (10 cm), you may start to feel an urge to push. It is common for the body to naturally take a rest before feeling the urge to push, especially if you received an epidural or certain other pain medicines. This rest period may last for up to 1-2 hours, depending on your unique labor experience.  During stage 2, contractions are generally less painful, because pushing helps relieve contraction pain. Instead of contraction pain, you may feel stretching and burning pain, especially when the widest part of your baby's head passes through the vaginal opening (crowning).  Your health care provider will closely monitor your pushing progress and your baby's progress through the vagina during stage 2.  Your health care  provider may massage the area of skin between your vaginal opening and anus (perineum) or apply warm compresses to your perineum. This helps it stretch as the baby's head starts to crown, which can help prevent perineal tearing.  In some cases, an incision may be made in your perineum (episiotomy) to allow the baby to pass through the vaginal opening. An episiotomy helps to make the opening of the vagina larger to allow more room for the baby to fit through.  It is very important to breathe and focus so your health care provider can control the delivery of your baby's head. Your health care provider may have you decrease the intensity of your pushing, to help prevent perineal tearing.  After delivery of your baby's head, the shoulders and the rest of the body generally deliver very quickly and without difficulty.  Once your baby is delivered, the umbilical cord may be cut right away, or this may be delayed for 1-2 minutes, depending on your baby's health. This may vary among health care providers, hospitals, and birth centers.  If you and your baby are healthy enough, your baby may be placed on your chest or abdomen to help maintain the baby's temperature and to help you bond with each other. Some mothers and babies start breastfeeding at this time. Your health care team will dry your baby and help keep your baby warm during this time.  Your baby may need immediate care if he or she:  Showed signs of distress during labor.  Has a medical condition.  Was born too early (prematurely).  Had a bowel movement before birth (meconium).  Shows signs of difficulty transitioning from being inside the uterus to being outside of the uterus. If you are planning to breastfeed, your health care team will help you begin a feeding. Stage 3   The third stage of labor starts immediately after the birth of your baby and ends after you deliver the placenta. The placenta is an organ that develops during  pregnancy to provide oxygen and nutrients to your baby in the womb.  Delivering the placenta may require some pushing, and you may have mild contractions. Breastfeeding can stimulate contractions to help you deliver the placenta.  After the placenta is delivered, your uterus should tighten (contract) and become firm. This helps to stop bleeding in your uterus. To help your uterus contract and to control bleeding, your health care provider may:  Give you medicine by injection, through an IV tube, by mouth, or through your rectum (rectally).  Massage your abdomen or perform a vaginal exam to remove any blood clots that are left in your uterus.  Empty your bladder by placing a thin, flexible tube (catheter) into your bladder.  Encourage   you to breastfeed your baby. After labor is over, you and your baby will be monitored closely to ensure that you are both healthy until you are ready to go home. Your health care team will teach you how to care for yourself and your baby. This information is not intended to replace advice given to you by your health care provider. Make sure you discuss any questions you have with your health care provider. Document Released: 11/01/2007 Document Revised: 08/12/2015 Document Reviewed: 02/06/2015 Elsevier Interactive Patient Education  2017 [employer] Inc.  

## 2016-02-21 NOTE — Progress Notes (Signed)
Subjective:  [name] is a 26 y.o. G2P0010 at [redacted]w[redacted]d being seen today for ongoing prenatal care.  She is currently monitored for the following issues for this high-risk pregnancy and has Autism spectrum disorder; Schizoaffective disorder, bipolar type by history(HCC); Borderline personality disorder; PTSD (post-traumatic stress disorder); Asthma; Chronic migraine; Supervision of high risk pregnancy, antepartum; Seizure disorder ([place]); Seizure disorder in pregnancy, antepartum ([place]); Rh negative, antepartum; 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; and Tachycardia with heart rate 121-140 beats per minute on her problem list.  Patient reports no complaints.  Contractions: Not present. Vag. Bleeding: None.  Movement: Present. Denies leaking of fluid.   The following portions of the patient's history were reviewed and updated as appropriate: allergies, current medications, past family history, past medical history, past social history, past surgical history and problem list. Problem list updated.  Objective:   Vitals:   02/21/16 1350  BP: 115/74  Pulse: (!) 114  Weight: 199 lb (90.3 kg)    Fetal Status: Fetal Heart Rate (bpm): 150   Movement: Present     General:  Alert, oriented and cooperative. Patient is in no acute distress.  Skin: Skin is warm and dry. No rash noted.   Cardiovascular: Normal heart rate noted  Respiratory: Normal respiratory effort, no problems with respiration noted  Abdomen: Soft, gravid, appropriate for gestational age. Pain/Pressure: Present     Pelvic:  Cervical exam performed        Extremities: Normal range of motion.  Edema: Trace  Mental Status: Normal mood and affect. Normal behavior. Normal judgment and thought content.   Urinalysis:      Assessment and Plan:  Pregnancy: G2P0010 at [redacted]w[redacted]d  1. Seizure disorder in pregnancy, antepartum ([place]) Weekly BPP - Culture, beta strep (group b only) - GC/Chlamydia probe amp ([place])not at [facility]  2. Schizoaffective  disorder, bipolar type by history(HCC)  - Culture, beta strep (group b only) - GC/Chlamydia probe amp ([place])not at [facility]  3. Supervision of high risk pregnancy, antepartum  - Culture, beta strep (group b only) - GC/Chlamydia probe amp ([place])not at [facility]  4. Rh negative, antepartum Tx post partum  5. 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   6. Tachycardia with heart rate 121-140 beats per minute   Term labor symptoms and general obstetric precautions including but not limited to vaginal bleeding, contractions, leaking of fluid and fetal movement were reviewed in detail with the patient. Please refer to After Visit Summary for other counseling recommendations.  Return in about 1 day (around 02/22/2016) for OB visit.   Chancy Milroy, MD

## 2016-02-22 ENCOUNTER — Encounter (HOSPITAL_COMMUNITY): Payer: Self-pay

## 2016-02-22 ENCOUNTER — Ambulatory Visit (HOSPITAL_COMMUNITY)
Admission: RE | Admit: 2016-02-22 | Discharge: 2016-02-22 | Disposition: A | Discharge status: Home / Self Care | Payer: PPO | Source: Ambulatory Visit | Attending: Obstetrics and Gynecology | Admitting: Obstetrics and Gynecology

## 2016-02-22 DIAGNOSIS — G40909 Epilepsy, unspecified, not intractable, without status epilepticus: Secondary | ICD-10-CM | POA: Diagnosis not present

## 2016-02-22 DIAGNOSIS — Z3A37 37 weeks gestation of pregnancy: Secondary | ICD-10-CM | POA: Diagnosis not present

## 2016-02-22 DIAGNOSIS — O99353 Diseases of the nervous system complicating pregnancy, third trimester: Secondary | ICD-10-CM | POA: Diagnosis not present

## 2016-02-22 DIAGNOSIS — O26893 Other specified pregnancy related conditions, third trimester: Secondary | ICD-10-CM | POA: Diagnosis not present

## 2016-02-22 DIAGNOSIS — J45909 Unspecified asthma, uncomplicated: Secondary | ICD-10-CM | POA: Diagnosis not present

## 2016-02-22 DIAGNOSIS — O99513 Diseases of the respiratory system complicating pregnancy, third trimester: Secondary | ICD-10-CM | POA: Diagnosis not present

## 2016-02-22 DIAGNOSIS — Z6791 Unspecified blood type, Rh negative: Secondary | ICD-10-CM | POA: Diagnosis not present

## 2016-02-23 ENCOUNTER — Ambulatory Visit (HOSPITAL_COMMUNITY): Payer: Medicare Other

## 2016-02-23 LAB — GC/CHLAMYDIA PROBE AMP (~~LOC~~) NOT AT ARMC
CHLAMYDIA, DNA PROBE: NEGATIVE
NEISSERIA GONORRHEA: NEGATIVE

## 2016-02-23 LAB — CULTURE, BETA STREP (GROUP B ONLY)

## 2016-02-27 ENCOUNTER — Encounter (HOSPITAL_COMMUNITY): Payer: Self-pay

## 2016-02-27 ENCOUNTER — Inpatient Hospital Stay (HOSPITAL_COMMUNITY)
Admission: AD | Admit: 2016-02-27 | Discharge: 2016-02-28 | Disposition: A | Discharge status: Home / Self Care | Payer: PPO | Source: Ambulatory Visit | Attending: Family Medicine | Admitting: Family Medicine

## 2016-02-27 DIAGNOSIS — Z818 Family history of other mental and behavioral disorders: Secondary | ICD-10-CM | POA: Diagnosis not present

## 2016-02-27 DIAGNOSIS — F840 Autistic disorder: Secondary | ICD-10-CM | POA: Insufficient documentation

## 2016-02-27 DIAGNOSIS — Z825 Family history of asthma and other chronic lower respiratory diseases: Secondary | ICD-10-CM | POA: Diagnosis not present

## 2016-02-27 DIAGNOSIS — O99513 Diseases of the respiratory system complicating pregnancy, third trimester: Secondary | ICD-10-CM | POA: Insufficient documentation

## 2016-02-27 DIAGNOSIS — O99343 Other mental disorders complicating pregnancy, third trimester: Secondary | ICD-10-CM | POA: Diagnosis not present

## 2016-02-27 DIAGNOSIS — J45909 Unspecified asthma, uncomplicated: Secondary | ICD-10-CM | POA: Insufficient documentation

## 2016-02-27 DIAGNOSIS — K219 Gastro-esophageal reflux disease without esophagitis: Secondary | ICD-10-CM | POA: Diagnosis not present

## 2016-02-27 DIAGNOSIS — Z888 Allergy status to other drugs, medicaments and biological substances status: Secondary | ICD-10-CM | POA: Diagnosis not present

## 2016-02-27 DIAGNOSIS — O9989 Other specified diseases and conditions complicating pregnancy, childbirth and the puerperium: Secondary | ICD-10-CM | POA: Diagnosis not present

## 2016-02-27 DIAGNOSIS — Z91030 Bee allergy status: Secondary | ICD-10-CM | POA: Diagnosis not present

## 2016-02-27 DIAGNOSIS — Q992 Fragile X chromosome: Secondary | ICD-10-CM | POA: Insufficient documentation

## 2016-02-27 DIAGNOSIS — G250 Essential tremor: Secondary | ICD-10-CM | POA: Diagnosis not present

## 2016-02-27 DIAGNOSIS — Z91018 Allergy to other foods: Secondary | ICD-10-CM | POA: Insufficient documentation

## 2016-02-27 DIAGNOSIS — F250 Schizoaffective disorder, bipolar type: Secondary | ICD-10-CM | POA: Insufficient documentation

## 2016-02-27 DIAGNOSIS — O479 False labor, unspecified: Secondary | ICD-10-CM

## 2016-02-27 DIAGNOSIS — Z87891 Personal history of nicotine dependence: Secondary | ICD-10-CM | POA: Insufficient documentation

## 2016-02-27 DIAGNOSIS — Z3A37 37 weeks gestation of pregnancy: Secondary | ICD-10-CM | POA: Insufficient documentation

## 2016-02-27 DIAGNOSIS — Z885 Allergy status to narcotic agent status: Secondary | ICD-10-CM | POA: Diagnosis not present

## 2016-02-27 DIAGNOSIS — O99613 Diseases of the digestive system complicating pregnancy, third trimester: Secondary | ICD-10-CM | POA: Insufficient documentation

## 2016-02-27 DIAGNOSIS — Z820 Family history of epilepsy and other diseases of the nervous system: Secondary | ICD-10-CM | POA: Diagnosis not present

## 2016-02-27 DIAGNOSIS — Z91013 Allergy to seafood: Secondary | ICD-10-CM | POA: Diagnosis not present

## 2016-02-27 LAB — URINALYSIS, ROUTINE W REFLEX MICROSCOPIC
BILIRUBIN URINE: NEGATIVE
GLUCOSE, UA: NEGATIVE mg/dL
HGB URINE DIPSTICK: NEGATIVE
Ketones, ur: NEGATIVE mg/dL
Leukocytes, UA: NEGATIVE
Nitrite: NEGATIVE
PH: 7 (ref 5.0–8.0)
Protein, ur: NEGATIVE mg/dL
SPECIFIC GRAVITY, URINE: 1.006 (ref 1.005–1.030)

## 2016-02-27 LAB — POCT FERN TEST: POCT Fern Test: NEGATIVE

## 2016-02-27 NOTE — Discharge Instructions (Signed)
Third Trimester of Pregnancy The third trimester is from week 29 through week 40 (months 7 through 9). The third trimester is a time when the unborn baby (fetus) is growing rapidly. At the end of the ninth month, the fetus is about 20 inches in length and weighs 6-10 pounds. Body changes during your third trimester Your body goes through many changes during pregnancy. The changes vary from woman to woman. During the third trimester:  Your weight will continue to increase. You can expect to gain 25-35 pounds (11-16 kg) by the end of the pregnancy.  You may begin to get stretch marks on your hips, abdomen, and breasts.  You may urinate more often because the fetus is moving lower into your pelvis and pressing on your bladder.  You may develop or continue to have heartburn. This is caused by increased hormones that slow down muscles in the digestive tract.  You may develop or continue to have constipation because increased hormones slow digestion and cause the muscles that push waste through your intestines to relax.  You may develop hemorrhoids. These are swollen veins (varicose veins) in the rectum that can itch or be painful.  You may develop swollen, bulging veins (varicose veins) in your legs.  You may have increased body aches in the pelvis, back, or thighs. This is due to weight gain and increased hormones that are relaxing your joints.  You may have changes in your hair. These can include thickening of your hair, rapid growth, and changes in texture. Some women also have hair loss during or after pregnancy, or hair that feels dry or thin. Your hair will most likely return to normal after your baby is born.  Your breasts will continue to grow and they will continue to become tender. A yellow fluid (colostrum) may leak from your breasts. This is the first milk you are producing for your baby.  Your belly button may stick out.  You may notice more swelling in your hands, face, or  ankles.  You may have increased tingling or numbness in your hands, arms, and legs. The skin on your belly may also feel numb.  You may feel short of breath because of your expanding uterus.  You may have more problems sleeping. This can be caused by the size of your belly, increased need to urinate, and an increase in your body's metabolism.  You may notice the fetus "dropping," or moving lower in your abdomen.  You may have increased vaginal discharge.  Your cervix becomes thin and soft (effaced) near your due date. What to expect at prenatal visits You will have prenatal exams every 2 weeks until week 36. Then you will have weekly prenatal exams. During a routine prenatal visit:  You will be weighed to make sure you and the fetus are growing normally.  Your blood pressure will be taken.  Your abdomen will be measured to track your baby's growth.  The fetal heartbeat will be listened to.  Any test results from the previous visit will be discussed.  You may have a cervical check near your due date to see if you have effaced. At around 36 weeks, your health care provider will check your cervix. At the same time, your health care provider will also perform a test on the secretions of the vaginal tissue. This test is to determine if a type of bacteria, Group B streptococcus, is present. Your health care provider will explain this further. Your health care provider may ask you:  What your birth plan is.  How you are feeling.  If you are feeling the baby move.  If you have had any abnormal symptoms, such as leaking fluid, bleeding, severe headaches, or abdominal cramping.  If you are using any tobacco products, including cigarettes, chewing tobacco, and electronic cigarettes.  If you have any questions. Other tests or screenings that may be performed during your third trimester include:  Blood tests that check for low iron levels (anemia).  Fetal testing to check the health,  activity level, and growth of the fetus. Testing is done if you have certain medical conditions or if there are problems during the pregnancy.  Nonstress test (NST). This test checks the health of your baby to make sure there are no signs of problems, such as the baby not getting enough oxygen. During this test, a belt is placed around your belly. The baby is made to move, and its heart rate is monitored during movement. What is false labor? False labor is a condition in which you feel small, irregular tightenings of the muscles in the womb (contractions) that eventually go away. These are called Braxton Hicks contractions. Contractions may last for hours, days, or even weeks before true labor sets in. If contractions come at regular intervals, become more frequent, increase in intensity, or become painful, you should see your health care provider. What are the signs of labor?  Abdominal cramps.  Regular contractions that start at 10 minutes apart and become stronger and more frequent with time.  Contractions that start on the top of the uterus and spread down to the lower abdomen and back.  Increased pelvic pressure and dull back pain.  A watery or bloody mucus discharge that comes from the vagina.  Leaking of amniotic fluid. This is also known as your "water breaking." It could be a slow trickle or a gush. Let your doctor know if it has a color or strange odor. If you have any of these signs, call your health care provider right away, even if it is before your due date. Follow these instructions at home: Eating and drinking  Continue to eat regular, healthy meals.  Do not eat:  Raw meat or meat spreads.  Unpasteurized milk or cheese.  Unpasteurized juice.  Store-made salad.  Refrigerated smoked seafood.  Hot dogs or deli meat, unless they are piping hot.  More than 6 ounces of albacore tuna a week.  Shark, swordfish, king mackerel, or tile fish.  Store-made salads.  Raw  sprouts, such as mung bean or alfalfa sprouts.  Take prenatal vitamins as told by your health care provider.  Take 1000 mg of calcium daily as told by your health care provider.  If you develop constipation:  Take over-the-counter or prescription medicines.  Drink enough fluid to keep your urine clear or pale yellow.  Eat foods that are high in fiber, such as fresh fruits and vegetables, whole grains, and beans.  Limit foods that are high in fat and processed sugars, such as fried and sweet foods. Activity  Exercise only as directed by your health care provider. Healthy pregnant women should aim for 2 hours and 30 minutes of moderate exercise per week. If you experience any pain or discomfort while exercising, stop.  Avoid heavy lifting.  Do not exercise in extreme heat or humidity, or at high altitudes.  Wear low-heel, comfortable shoes.  Practice good posture.  Do not travel far distances unless it is absolutely necessary and only with the approval  of your health care provider.  Wear your seat belt at all times while in a car, on a bus, or on a plane.  Take frequent breaks and rest with your legs elevated if you have leg cramps or low back pain.  Do not use hot tubs, steam rooms, or saunas.  You may continue to have sex unless your health care provider tells you otherwise. Lifestyle  Do not use any products that contain nicotine or tobacco, such as cigarettes and e-cigarettes. If you need help quitting, ask your health care provider.  Do not drink alcohol.  Do not use any medicinal herbs or unprescribed drugs. These chemicals affect the formation and growth of the baby.  If you develop varicose veins:  Wear support pantyhose or compression stockings as told by your healthcare provider.  Elevate your feet for 15 minutes, 3-4 times a day.  Wear a supportive maternity bra to help with breast tenderness. General instructions  Take over-the-counter and prescription  medicines only as told by your health care provider. There are medicines that are either safe or unsafe to take during pregnancy.  Take warm sitz baths to soothe any pain or discomfort caused by hemorrhoids. Use hemorrhoid cream or witch hazel if your health care provider approves.  Avoid cat litter boxes and soil used by cats. These carry germs that can cause birth defects in the baby. If you have a cat, ask someone to clean the litter box for you.  To prepare for the arrival of your baby:  Take prenatal classes to understand, practice, and ask questions about the labor and delivery.  Make a trial run to the hospital.  Visit the hospital and tour the maternity area.  Arrange for maternity or paternity leave through employers.  Arrange for family and friends to take care of pets while you are in the hospital.  Purchase a rear-facing car seat and make sure you know how to install it in your car.  Pack your hospital bag.  Prepare the babys nursery. Make sure to remove all pillows and stuffed animals from the baby's crib to prevent suffocation.  Visit your dentist if you have not gone during your pregnancy. Use a soft toothbrush to brush your teeth and be gentle when you floss.  Keep all prenatal follow-up visits as told by your health care provider. This is important. Contact a health care provider if:  You are unsure if you are in labor or if your water has broken.  You become dizzy.  You have mild pelvic cramps, pelvic pressure, or nagging pain in your abdominal area.  You have lower back pain.  You have persistent nausea, vomiting, or diarrhea.  You have an unusual or bad smelling vaginal discharge.  You have pain when you urinate. Get help right away if:  You have a fever.  You are leaking fluid from your vagina.  You have spotting or bleeding from your vagina.  You have severe abdominal pain or cramping.  You have rapid weight loss or weight gain.  You have  shortness of breath with chest pain.  You notice sudden or extreme swelling of your face, hands, ankles, feet, or legs.  Your baby makes fewer than 10 movements in 2 hours.  You have severe headaches that do not go away with medicine.  You have vision changes. Summary  The third trimester is from week 29 through week 40, months 7 through 9. The third trimester is a time when the unborn baby (fetus)  is growing rapidly.  During the third trimester, your discomfort may increase as you and your baby continue to gain weight. You may have abdominal, leg, and back pain, sleeping problems, and an increased need to urinate.  During the third trimester your breasts will keep growing and they will continue to become tender. A yellow fluid (colostrum) may leak from your breasts. This is the first milk you are producing for your baby.  False labor is a condition in which you feel small, irregular tightenings of the muscles in the womb (contractions) that eventually go away. These are called Braxton Hicks contractions. Contractions may last for hours, days, or even weeks before true labor sets in.  Signs of labor can include: abdominal cramps; regular contractions that start at 10 minutes apart and become stronger and more frequent with time; watery or bloody mucus discharge that comes from the vagina; increased pelvic pressure and dull back pain; and leaking of amniotic fluid. This information is not intended to replace advice given to you by your health care provider. Make sure you discuss any questions you have with your health care provider. Document Released: 01/16/2001 Document Revised: 06/30/2015 Document Reviewed: 03/25/2012 Elsevier Interactive Patient Education  2017 [place]. Introduction Patient Name: ________________________________________________ Patient Due Date: ____________________ What is a fetal movement count? A fetal movement count is the number of times that you feel your baby  move during a certain amount of time. This may also be called a fetal kick count. A fetal movement count is recommended for every pregnant woman. You may be asked to start counting fetal movements as early as week 28 of your pregnancy. Pay attention to when your baby is most active. You may notice your baby's sleep and wake cycles. You may also notice things that make your baby move more. You should do a fetal movement count:  When your baby is normally most active.  At the same time each day. A good time to count movements is while you are resting, after having something to eat and drink. How do I count fetal movements? 1. Find a quiet, comfortable area. Sit, or lie down on your side. 2. Write down the date, the start time and stop time, and the number of movements that you felt between those two times. Take this information with you to your health care visits. 3. For 2 hours, count kicks, flutters, swishes, rolls, and jabs. You should feel at least 10 movements during 2 hours. 4. You may stop counting after you have felt 10 movements. 5. If you do not feel 10 movements in 2 hours, have something to eat and drink. Then, keep resting and counting for 1 hour. If you feel at least 4 movements during that hour, you may stop counting. Contact a health care provider if:  You feel fewer than 4 movements in 2 hours.  Your baby is not moving like he or she usually does. Date: ____________ Start time: ____________ Stop time: ____________ Movements: ____________ Date: ____________ Start time: ____________ Stop time: ____________ Movements: ____________ Date: ____________ Start time: ____________ Stop time: ____________ Movements: ____________ Date: ____________ Start time: ____________ Stop time: ____________ Movements: ____________ Date: ____________ Start time: ____________ Stop time: ____________ Movements: ____________ Date: ____________ Start time: ____________ Stop time: ____________ Movements:  ____________ Date: ____________ Start time: ____________ Stop time: ____________ Movements: ____________ Date: ____________ Start time: ____________ Stop time: ____________ Movements: ____________ Date: ____________ Start time: ____________ Stop time: ____________ Movements: ____________ This information is not intended to replace  advice given to you by your health care provider. Make sure you discuss any questions you have with your health care provider. Document Released: 02/21/2006 Document Revised: 09/21/2015 Document Reviewed: 03/03/2015 Elsevier Interactive Patient Education  2017 [employer].

## 2016-02-27 NOTE — MAU Provider Note (Signed)
History     CSN: [id number]  Arrival date and time: 02/27/16 2143   First Provider Initiated Contact with Patient 02/27/16 2343      Chief Complaint  Patient presents with  . Labor Eval   Patient is a 26 y/o G2P0 at 37 wks and 6 days presents for contractions. While in MAU patient believes water may have broken. She reports contractions every 5-7 minutes that are 4-5 out of 10 in intensity. They started about 8-9PM.     OB History    Gravida Para Term Preterm AB Living   2       1 0   SAB TAB Ectopic Multiple Live Births   1              Past Medical History:  Diagnosis Date  . Acid reflux   . Asthma   . Autism   . 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 Chronic constipation   . Depression   . Drug-seeking behavior   . Essential tremor   . Personality disorder   . Schizo-affective psychosis ([place])   . Schizoaffective disorder, bipolar type ([place])   . Seizures [facility])    Last seizure November 2017    Past Surgical History:  Procedure Laterality Date  . MOUTH SURGERY      Family History  Problem Relation Age of Onset  . Mental illness Father   . Asthma Father   . PDD Brother   . Seizures Brother     Social History  Substance Use Topics  . Smoking status: Former Smoker    Packs/day: 0.00  . Smokeless tobacco: Never Used     Comment: Smoked for 2  years age [age]  . Alcohol use 0.6 oz/week    1 Standard drinks or equivalent per week     Comment: "not with preg"    Allergies:  Allergies  Allergen Reactions  . Bee Venom Anaphylaxis  . Coconut Flavor Anaphylaxis  . Geodon [Ziprasidone Hcl] Other (See Comments)    Pt states that this medication causes paralysis of the mouth.    . Haloperidol And Related Other (See Comments)    Pt states that this medication causes paralysis of the mouth.    . Lithium Other (See Comments)    Reaction:  Seizure-like activity.    . Oxycodone Other (See Comments)    Reaction:  Hallucinations   . Seroquel [Quetiapine  Fumarate] Other (See Comments)    Pt states that this medication is too strong.   . Shellfish Allergy Anaphylaxis  . Percocet [Oxycodone-Acetaminophen] Other (See Comments)    Reaction:  Hallucinations   . Phenergan [Promethazine Hcl] Other (See Comments)    Reaction:  Chest pain    . Prilosec [Omeprazole] Nausea And Vomiting  . Tegretol [Carbamazepine] Nausea And Vomiting    Prescriptions Prior to Admission  Medication Sig Dispense Refill Last Dose  . acetaminophen (TYLENOL) 500 MG tablet Take 1,000 mg by mouth every 6 (six) hours as needed for mild pain, moderate pain or headache.   Taking  . albuterol (PROVENTIL HFA;VENTOLIN HFA) 108 (90 Base) MCG/ACT inhaler Inhale 1-2 puffs into the lungs every 6 (six) hours as needed for wheezing or shortness of breath.   Taking  . Benzocaine (ORAJEL MT) Use as directed in the mouth or throat.   Taking  . calcium carbonate (TUMS - DOSED IN MG ELEMENTAL CALCIUM) 500 MG chewable tablet Chew 2 tablets by mouth as needed for indigestion or heartburn.  Taking  . diphenhydrAMINE (BENADRYL) 25 mg capsule Take 50 mg by mouth at bedtime as needed for sleep.    Taking  . docusate sodium (COLACE) 100 MG capsule Take 200 mg by mouth daily.    Taking  . Ergocalciferol (VITAMIN D2) 400 units TABS Take 800 Units by mouth daily.   Taking  . fluticasone-salmeterol (ADVAIR HFA) 115-21 MCG/ACT inhaler Inhale 2 puffs into the lungs 2 (two) times daily. 1 Inhaler 5 Taking  . labetalol (NORMODYNE) 100 MG tablet Take 1 tablet (100 mg total) by mouth 2 (two) times daily. 60 tablet 2 Taking  . levETIRAcetam (KEPPRA) 750 MG tablet Take 1 tablet (750 mg total) by mouth 3 (three) times daily. 90 tablet 11 Taking  . Menthol, Topical Analgesic, (BENGAY EX) Apply 1 application topically as needed (for pain).    Taking  . Prenatal Vit-Fe Fumarate-FA (PRENATAL COMPLETE) 14-0.4 MG TABS Take 1 tablet by mouth at bedtime.   Taking  . pyridOXINE (VITAMIN B-6) 100 MG tablet Take 200 mg  by mouth 2 (two) times daily.   Taking  . ranitidine (ZANTAC) 150 MG tablet Take 150 mg by mouth 2 (two) times daily.   Taking  . risperiDONE (RISPERDAL) 2 MG tablet Take 2 mg by mouth 2 (two) times daily.   0 Taking  . senna (SENOKOT) 8.6 MG tablet Take 2 tablets by mouth at bedtime.    Taking  . sertraline (ZOLOFT) 25 MG tablet Take 50 mg by mouth daily.   0 Taking    Review of Systems  Constitutional: Negative for chills and fever.  Respiratory: Negative for cough and shortness of breath.   Gastrointestinal: Positive for abdominal pain. Negative for abdominal distention, constipation, diarrhea, nausea and vomiting.  Neurological: Negative for dizziness.   Physical Exam   Blood pressure 137/63, pulse 109, temperature 98.1 F (36.7 C), temperature source Oral, resp. rate 19, last menstrual period 06/07/2015.  Physical Exam  Constitutional: She is oriented to person, place, and time.  Respiratory: Effort normal. No respiratory distress.  GI: Soft. She exhibits no distension. There is no tenderness. There is no rebound and no guarding.  Genitourinary:  Genitourinary Comments: Significant thick mucous vaginal discharge. No pooling. Negative ferning. Cervix 1/50/-3  Neurological: She is alert and oriented to person, place, and time. No cranial nerve deficit.  Skin: Skin is warm and dry.  Psychiatric: She has a normal mood and affect. Her behavior is normal.    MAU Course  Procedures  MDM In MAU patient underwent serial vaginal exams and showed an unchanged cervix at 1 cm.  Fern and pooling where negative.    Assessment and Plan  #1: early Labor without rupture of membranes. D/C home with precautions.   [name] 02/27/2016, 11:51 PM

## 2016-02-28 ENCOUNTER — Inpatient Hospital Stay (HOSPITAL_COMMUNITY)
Admission: AD | Admit: 2016-02-28 | Discharge: 2016-02-28 | Disposition: A | Discharge status: Home / Self Care | Payer: PPO | Source: Ambulatory Visit | Attending: Family Medicine | Admitting: Family Medicine

## 2016-02-28 ENCOUNTER — Encounter (HOSPITAL_COMMUNITY): Payer: Self-pay

## 2016-02-28 DIAGNOSIS — Z3490 Encounter for supervision of normal pregnancy, unspecified, unspecified trimester: Secondary | ICD-10-CM | POA: Insufficient documentation

## 2016-02-28 DIAGNOSIS — Z3A00 Weeks of gestation of pregnancy not specified: Secondary | ICD-10-CM | POA: Insufficient documentation

## 2016-02-28 DIAGNOSIS — Z0371 Encounter for suspected problem with amniotic cavity and membrane ruled out: Secondary | ICD-10-CM

## 2016-02-28 LAB — AMNISURE RUPTURE OF MEMBRANE (ROM) NOT AT ARMC: AMNISURE: NEGATIVE

## 2016-02-28 NOTE — MAU Note (Signed)
Returned due to noticing 3 gushes of clear, thin fluid coming out at approximately 0530 this morning while ambulating.  No Vb, Good Fm.

## 2016-02-28 NOTE — Discharge Instructions (Signed)
Introduction Patient Name: ________________________________________________ Patient Due Date: ____________________ What is a fetal movement count? A fetal movement count is the number of times that you feel your baby move during a certain amount of time. This may also be called a fetal kick count. A fetal movement count is recommended for every pregnant woman. You may be asked to start counting fetal movements as early as week 28 of your pregnancy. Pay attention to when your baby is most active. You may notice your baby's sleep and wake cycles. You may also notice things that make your baby move more. You should do a fetal movement count:  When your baby is normally most active.  At the same time each day. A good time to count movements is while you are resting, after having something to eat and drink. How do I count fetal movements? 1. Find a quiet, comfortable area. Sit, or lie down on your side. 2. Write down the date, the start time and stop time, and the number of movements that you felt between those two times. Take this information with you to your health care visits. 3. For 2 hours, count kicks, flutters, swishes, rolls, and jabs. You should feel at least 10 movements during 2 hours. 4. You may stop counting after you have felt 10 movements. 5. If you do not feel 10 movements in 2 hours, have something to eat and drink. Then, keep resting and counting for 1 hour. If you feel at least 4 movements during that hour, you may stop counting. Contact a health care provider if:  You feel fewer than 4 movements in 2 hours.  Your baby is not moving like he or she usually does. Date: ____________ Start time: ____________ Stop time: ____________ Movements: ____________ Date: ____________ Start time: ____________ Stop time: ____________ Movements: ____________ Date: ____________ Start time: ____________ Stop time: ____________ Movements: ____________ Date: ____________ Start time: ____________  Stop time: ____________ Movements: ____________ Date: ____________ Start time: ____________ Stop time: ____________ Movements: ____________ Date: ____________ Start time: ____________ Stop time: ____________ Movements: ____________ Date: ____________ Start time: ____________ Stop time: ____________ Movements: ____________ Date: ____________ Start time: ____________ Stop time: ____________ Movements: ____________ Date: ____________ Start time: ____________ Stop time: ____________ Movements: ____________ This information is not intended to replace advice given to you by your health care provider. Make sure you discuss any questions you have with your health care provider. Document Released: 02/21/2006 Document Revised: 09/21/2015 Document Reviewed: 03/03/2015 Elsevier Interactive Patient Education  2017 [place]. Vaginal Delivery Vaginal delivery means that you will give birth by pushing your baby out of your birth canal (vagina). A team of health care providers will help you before, during, and after vaginal delivery. Birth experiences are unique for every woman and every pregnancy, and birth experiences vary depending on where you choose to give birth. What should I do to prepare for my baby's birth? Before your baby is born, it is important to talk with your health care provider about:  Your labor and delivery preferences. These may include:  Medicines that you may be given.  How you will manage your pain. This might include non-medical pain relief techniques or injectable pain relief such as epidural analgesia.  How you and your baby will be monitored during labor and delivery.  Who may be in the labor and delivery room with you.  Your feelings about surgical delivery of your baby (cesarean delivery, or C-section) if this becomes necessary.  Your feelings about receiving donated blood through an  IV tube (blood transfusion) if this becomes necessary.  Whether you are able:  To  take pictures or videos of the birth.  To eat during labor and delivery.  To move around, walk, or change positions during labor and delivery.  What to expect after your baby is born, such as:  Whether delayed umbilical cord clamping and cutting is offered.  Who will care for your baby right after birth.  Medicines or tests that may be recommended for your baby.  Whether breastfeeding is supported in your hospital or birth center.  How long you will be in the hospital or birth center.  How any medical conditions you have may affect your baby or your labor and delivery experience. To prepare for your baby's birth, you should also:  Attend all of your health care visits before delivery (prenatal visits) as recommended by your health care provider. This is important.  Prepare your home for your baby's arrival. Make sure that you have:  Diapers.  Baby clothing.  Feeding equipment.  Safe sleeping arrangements for you and your baby.  Install a car seat in your vehicle. Have your car seat checked by a certified car seat installer to make sure that it is installed safely.  Think about who will help you with your new baby at home for at least the first several weeks after delivery. What can I expect when I arrive at the birth center or hospital? Once you are in labor and have been admitted into the hospital or birth center, your health care provider may:  Review your pregnancy history and any concerns you have.  Insert an IV tube into one of your veins. This is used to give you fluids and medicines.  Check your blood pressure, pulse, temperature, and heart rate (vital signs).  Check whether your bag of water (amniotic sac) has broken (ruptured).  Talk with you about your birth plan and discuss pain control options. Monitoring Your health care provider may monitor your contractions (uterine monitoring) and your baby's heart rate (fetal monitoring). You may need to be  monitored:  Often, but not continuously (intermittently).  All the time or for long periods at a time (continuously). Continuous monitoring may be needed if:  You are taking certain medicines, such as medicine to relieve pain or make your contractions stronger.  You have pregnancy or labor complications. Monitoring may be done by:  Placing a special stethoscope or a handheld monitoring device on your abdomen to check your baby's heartbeat, and feeling your abdomen for contractions. This method of monitoring does not continuously record your baby's heartbeat or your contractions.  Placing monitors on your abdomen (external monitors) to record your baby's heartbeat and the frequency and length of contractions. You may not have to wear external monitors all the time.  Placing monitors inside of your uterus (internal monitors) to record your baby's heartbeat and the frequency, length, and strength of your contractions.  Your health care provider may use internal monitors if he or she needs more information about the strength of your contractions or your baby's heart rate.  Internal monitors are put in place by passing a thin, flexible wire through your vagina and into your uterus. Depending on the type of monitor, it may remain in your uterus or on your baby's head until birth.  Your health care provider will discuss the benefits and risks of internal monitoring with you and will ask for your permission before inserting the monitors.  Telemetry. This is a  type of continuous monitoring that can be done with external or internal monitors. Instead of having to stay in bed, you are able to move around during telemetry. Ask your health care provider if telemetry is an option for you. Physical exam Your health care provider may perform a physical exam. This may include:  Checking whether your baby is positioned:  With the head toward your vagina (head-down). This is most common.  With the head  toward the top of your uterus (head-up or breech). If your baby is in a breech position, your health care provider may try to turn your baby to a head-down position so you can deliver vaginally. If it does not seem that your baby can be born vaginally, your provider may recommend surgery to deliver your baby. In rare cases, you may be able to deliver vaginally if your baby is head-up (breech delivery).  Lying sideways (transverse). Babies that are lying sideways cannot be delivered vaginally.  Checking your cervix to determine:  Whether it is thinning out (effacing).  Whether it is opening up (dilating).  How low your baby has moved into your birth canal. What are the three stages of labor and delivery?   Normal labor and delivery is divided into the following three stages: Stage 1  Stage 1 is the longest stage of labor, and it can last for hours or days. Stage 1 includes:  Early labor. This is when contractions may be irregular, or regular and mild. Generally, early labor contractions are more than 10 minutes apart.  Active labor. This is when contractions get longer, more regular, more frequent, and more intense.  The transition phase. This is when contractions happen very close together, are very intense, and may last longer than during any other part of labor.  Contractions generally feel mild, infrequent, and irregular at first. They get stronger, more frequent (about every 2-3 minutes), and more regular as you progress from early labor through active labor and transition.  Many women progress through stage 1 naturally, but you may need help to continue making progress. If this happens, your health care provider may talk with you about:  Rupturing your amniotic sac if it has not ruptured yet.  Giving you medicine to help make your contractions stronger and more frequent.  Stage 1 ends when your cervix is completely dilated to 4 inches (10 cm) and completely effaced. This happens  at the end of the transition phase. Stage 2  Once your cervix is completely effaced and dilated to 4 inches (10 cm), you may start to feel an urge to push. It is common for the body to naturally take a rest before feeling the urge to push, especially if you received an epidural or certain other pain medicines. This rest period may last for up to 1-2 hours, depending on your unique labor experience.  During stage 2, contractions are generally less painful, because pushing helps relieve contraction pain. Instead of contraction pain, you may feel stretching and burning pain, especially when the widest part of your baby's head passes through the vaginal opening (crowning).  Your health care provider will closely monitor your pushing progress and your baby's progress through the vagina during stage 2.  Your health care provider may massage the area of skin between your vaginal opening and anus (perineum) or apply warm compresses to your perineum. This helps it stretch as the baby's head starts to crown, which can help prevent perineal tearing.  In some cases, an incision may  be made in your perineum (episiotomy) to allow the baby to pass through the vaginal opening. An episiotomy helps to make the opening of the vagina larger to allow more room for the baby to fit through.  It is very important to breathe and focus so your health care provider can control the delivery of your baby's head. Your health care provider may have you decrease the intensity of your pushing, to help prevent perineal tearing.  After delivery of your baby's head, the shoulders and the rest of the body generally deliver very quickly and without difficulty.  Once your baby is delivered, the umbilical cord may be cut right away, or this may be delayed for 1-2 minutes, depending on your baby's health. This may vary among health care providers, hospitals, and birth centers.  If you and your baby are healthy enough, your baby may be  placed on your chest or abdomen to help maintain the baby's temperature and to help you bond with each other. Some mothers and babies start breastfeeding at this time. Your health care team will dry your baby and help keep your baby warm during this time.  Your baby may need immediate care if he or she:  Showed signs of distress during labor.  Has a medical condition.  Was born too early (prematurely).  Had a bowel movement before birth (meconium).  Shows signs of difficulty transitioning from being inside the uterus to being outside of the uterus. If you are planning to breastfeed, your health care team will help you begin a feeding. Stage 3  The third stage of labor starts immediately after the birth of your baby and ends after you deliver the placenta. The placenta is an organ that develops during pregnancy to provide oxygen and nutrients to your baby in the womb.  Delivering the placenta may require some pushing, and you may have mild contractions. Breastfeeding can stimulate contractions to help you deliver the placenta.  After the placenta is delivered, your uterus should tighten (contract) and become firm. This helps to stop bleeding in your uterus. To help your uterus contract and to control bleeding, your health care provider may:  Give you medicine by injection, through an IV tube, by mouth, or through your rectum (rectally).  Massage your abdomen or perform a vaginal exam to remove any blood clots that are left in your uterus.  Empty your bladder by placing a thin, flexible tube (catheter) into your bladder.  Encourage you to breastfeed your baby. After labor is over, you and your baby will be monitored closely to ensure that you are both healthy until you are ready to go home. Your health care team will teach you how to care for yourself and your baby. This information is not intended to replace advice given to you by your health care provider. Make sure you discuss any  questions you have with your health care provider. Document Released: 11/01/2007 Document Revised: 08/12/2015 Document Reviewed: 02/06/2015 Elsevier Interactive Patient Education  2017 [place] of Pregnancy The third trimester is from week 29 through week 40 (months 7 through 9). The third trimester is a time when the unborn baby (fetus) is growing rapidly. At the end of the ninth month, the fetus is about 20 inches in length and weighs 6-10 pounds. Body changes during your third trimester Your body goes through many changes during pregnancy. The changes vary from woman to woman. During the third trimester:  Your weight will continue to increase. You  can expect to gain 25-35 pounds (11-16 kg) by the end of the pregnancy.  You may begin to get stretch marks on your hips, abdomen, and breasts.  You may urinate more often because the fetus is moving lower into your pelvis and pressing on your bladder.  You may develop or continue to have heartburn. This is caused by increased hormones that slow down muscles in the digestive tract.  You may develop or continue to have constipation because increased hormones slow digestion and cause the muscles that push waste through your intestines to relax.  You may develop hemorrhoids. These are swollen veins (varicose veins) in the rectum that can itch or be painful.  You may develop swollen, bulging veins (varicose veins) in your legs.  You may have increased body aches in the pelvis, back, or thighs. This is due to weight gain and increased hormones that are relaxing your joints.  You may have changes in your hair. These can include thickening of your hair, rapid growth, and changes in texture. Some women also have hair loss during or after pregnancy, or hair that feels dry or thin. Your hair will most likely return to normal after your baby is born.  Your breasts will continue to grow and they will continue to become tender. A  yellow fluid (colostrum) may leak from your breasts. This is the first milk you are producing for your baby.  Your belly button may stick out.  You may notice more swelling in your hands, face, or ankles.  You may have increased tingling or numbness in your hands, arms, and legs. The skin on your belly may also feel numb.  You may feel short of breath because of your expanding uterus.  You may have more problems sleeping. This can be caused by the size of your belly, increased need to urinate, and an increase in your body's metabolism.  You may notice the fetus "dropping," or moving lower in your abdomen.  You may have increased vaginal discharge.  Your cervix becomes thin and soft (effaced) near your due date. What to expect at prenatal visits You will have prenatal exams every 2 weeks until week 36. Then you will have weekly prenatal exams. During a routine prenatal visit:  You will be weighed to make sure you and the fetus are growing normally.  Your blood pressure will be taken.  Your abdomen will be measured to track your baby's growth.  The fetal heartbeat will be listened to.  Any test results from the previous visit will be discussed.  You may have a cervical check near your due date to see if you have effaced. At around 36 weeks, your health care provider will check your cervix. At the same time, your health care provider will also perform a test on the secretions of the vaginal tissue. This test is to determine if a type of bacteria, Group B streptococcus, is present. Your health care provider will explain this further. Your health care provider may ask you:  What your birth plan is.  How you are feeling.  If you are feeling the baby move.  If you have had any abnormal symptoms, such as leaking fluid, bleeding, severe headaches, or abdominal cramping.  If you are using any tobacco products, including cigarettes, chewing tobacco, and electronic cigarettes.  If you  have any questions. Other tests or screenings that may be performed during your third trimester include:  Blood tests that check for low iron levels (anemia).  Fetal  testing to check the health, activity level, and growth of the fetus. Testing is done if you have certain medical conditions or if there are problems during the pregnancy.  Nonstress test (NST). This test checks the health of your baby to make sure there are no signs of problems, such as the baby not getting enough oxygen. During this test, a belt is placed around your belly. The baby is made to move, and its heart rate is monitored during movement. What is false labor? False labor is a condition in which you feel small, irregular tightenings of the muscles in the womb (contractions) that eventually go away. These are called Braxton Hicks contractions. Contractions may last for hours, days, or even weeks before true labor sets in. If contractions come at regular intervals, become more frequent, increase in intensity, or become painful, you should see your health care provider. What are the signs of labor?  Abdominal cramps.  Regular contractions that start at 10 minutes apart and become stronger and more frequent with time.  Contractions that start on the top of the uterus and spread down to the lower abdomen and back.  Increased pelvic pressure and dull back pain.  A watery or bloody mucus discharge that comes from the vagina.  Leaking of amniotic fluid. This is also known as your "water breaking." It could be a slow trickle or a gush. Let your doctor know if it has a color or strange odor. If you have any of these signs, call your health care provider right away, even if it is before your due date. Follow these instructions at home: Eating and drinking  Continue to eat regular, healthy meals.  Do not eat:  Raw meat or meat spreads.  Unpasteurized milk or cheese.  Unpasteurized juice.  Store-made  salad.  Refrigerated smoked seafood.  Hot dogs or deli meat, unless they are piping hot.  More than 6 ounces of albacore tuna a week.  Shark, swordfish, king mackerel, or tile fish.  Store-made salads.  Raw sprouts, such as mung bean or alfalfa sprouts.  Take prenatal vitamins as told by your health care provider.  Take 1000 mg of calcium daily as told by your health care provider.  If you develop constipation:  Take over-the-counter or prescription medicines.  Drink enough fluid to keep your urine clear or pale yellow.  Eat foods that are high in fiber, such as fresh fruits and vegetables, whole grains, and beans.  Limit foods that are high in fat and processed sugars, such as fried and sweet foods. Activity  Exercise only as directed by your health care provider. Healthy pregnant women should aim for 2 hours and 30 minutes of moderate exercise per week. If you experience any pain or discomfort while exercising, stop.  Avoid heavy lifting.  Do not exercise in extreme heat or humidity, or at high altitudes.  Wear low-heel, comfortable shoes.  Practice good posture.  Do not travel far distances unless it is absolutely necessary and only with the approval of your health care provider.  Wear your seat belt at all times while in a car, on a bus, or on a plane.  Take frequent breaks and rest with your legs elevated if you have leg cramps or low back pain.  Do not use hot tubs, steam rooms, or saunas.  You may continue to have sex unless your health care provider tells you otherwise. Lifestyle  Do not use any products that contain nicotine or tobacco, such as cigarettes  and e-cigarettes. If you need help quitting, ask your health care provider.  Do not drink alcohol.  Do not use any medicinal herbs or unprescribed drugs. These chemicals affect the formation and growth of the baby.  If you develop varicose veins:  Wear support pantyhose or compression stockings as  told by your healthcare provider.  Elevate your feet for 15 minutes, 3-4 times a day.  Wear a supportive maternity bra to help with breast tenderness. General instructions  Take over-the-counter and prescription medicines only as told by your health care provider. There are medicines that are either safe or unsafe to take during pregnancy.  Take warm sitz baths to soothe any pain or discomfort caused by hemorrhoids. Use hemorrhoid cream or witch hazel if your health care provider approves.  Avoid cat litter boxes and soil used by cats. These carry germs that can cause birth defects in the baby. If you have a cat, ask someone to clean the litter box for you.  To prepare for the arrival of your baby:  Take prenatal classes to understand, practice, and ask questions about the labor and delivery.  Make a trial run to the hospital.  Visit the hospital and tour the maternity area.  Arrange for maternity or paternity leave through employers.  Arrange for family and friends to take care of pets while you are in the hospital.  Purchase a rear-facing car seat and make sure you know how to install it in your car.  Pack your hospital bag.  Prepare the babys nursery. Make sure to remove all pillows and stuffed animals from the baby's crib to prevent suffocation.  Visit your dentist if you have not gone during your pregnancy. Use a soft toothbrush to brush your teeth and be gentle when you floss.  Keep all prenatal follow-up visits as told by your health care provider. This is important. Contact a health care provider if:  You are unsure if you are in labor or if your water has broken.  You become dizzy.  You have mild pelvic cramps, pelvic pressure, or nagging pain in your abdominal area.  You have lower back pain.  You have persistent nausea, vomiting, or diarrhea.  You have an unusual or bad smelling vaginal discharge.  You have pain when you urinate. Get help right away  if:  You have a fever.  You are leaking fluid from your vagina.  You have spotting or bleeding from your vagina.  You have severe abdominal pain or cramping.  You have rapid weight loss or weight gain.  You have shortness of breath with chest pain.  You notice sudden or extreme swelling of your face, hands, ankles, feet, or legs.  Your baby makes fewer than 10 movements in 2 hours.  You have severe headaches that do not go away with medicine.  You have vision changes. Summary  The third trimester is from week 29 through week 40, months 7 through 9. The third trimester is a time when the unborn baby (fetus) is growing rapidly.  During the third trimester, your discomfort may increase as you and your baby continue to gain weight. You may have abdominal, leg, and back pain, sleeping problems, and an increased need to urinate.  During the third trimester your breasts will keep growing and they will continue to become tender. A yellow fluid (colostrum) may leak from your breasts. This is the first milk you are producing for your baby.  False labor is a condition in which you  feel small, irregular tightenings of the muscles in the womb (contractions) that eventually go away. These are called Braxton Hicks contractions. Contractions may last for hours, days, or even weeks before true labor sets in.  Signs of labor can include: abdominal cramps; regular contractions that start at 10 minutes apart and become stronger and more frequent with time; watery or bloody mucus discharge that comes from the vagina; increased pelvic pressure and dull back pain; and leaking of amniotic fluid. This information is not intended to replace advice given to you by your health care provider. Make sure you discuss any questions you have with your health care provider. Document Released: 01/16/2001 Document Revised: 06/30/2015 Document Reviewed: 03/25/2012 Elsevier Interactive Patient Education  2017 [employer].

## 2016-02-29 ENCOUNTER — Ambulatory Visit (HOSPITAL_COMMUNITY)
Admission: RE | Admit: 2016-02-29 | Discharge: 2016-02-29 | Disposition: A | Discharge status: Home / Self Care | Payer: PPO | Source: Ambulatory Visit | Attending: Obstetrics and Gynecology | Admitting: Obstetrics and Gynecology

## 2016-02-29 ENCOUNTER — Encounter (HOSPITAL_COMMUNITY): Payer: Self-pay

## 2016-02-29 DIAGNOSIS — Z3A38 38 weeks gestation of pregnancy: Secondary | ICD-10-CM | POA: Insufficient documentation

## 2016-02-29 DIAGNOSIS — Z148 Genetic carrier of other disease: Secondary | ICD-10-CM | POA: Insufficient documentation

## 2016-02-29 DIAGNOSIS — O26893 Other specified pregnancy related conditions, third trimester: Secondary | ICD-10-CM | POA: Diagnosis not present

## 2016-02-29 DIAGNOSIS — J45909 Unspecified asthma, uncomplicated: Secondary | ICD-10-CM | POA: Diagnosis not present

## 2016-02-29 DIAGNOSIS — G40909 Epilepsy, unspecified, not intractable, without status epilepticus: Secondary | ICD-10-CM | POA: Insufficient documentation

## 2016-02-29 DIAGNOSIS — O99513 Diseases of the respiratory system complicating pregnancy, third trimester: Secondary | ICD-10-CM | POA: Diagnosis not present

## 2016-02-29 DIAGNOSIS — O99343 Other mental disorders complicating pregnancy, third trimester: Secondary | ICD-10-CM | POA: Diagnosis not present

## 2016-02-29 DIAGNOSIS — Z6791 Unspecified blood type, Rh negative: Secondary | ICD-10-CM | POA: Insufficient documentation

## 2016-02-29 DIAGNOSIS — O99353 Diseases of the nervous system complicating pregnancy, third trimester: Secondary | ICD-10-CM | POA: Diagnosis not present

## 2016-03-01 ENCOUNTER — Ambulatory Visit (INDEPENDENT_AMBULATORY_CARE_PROVIDER_SITE_OTHER): Payer: Medicaid Other | Admitting: Family Medicine

## 2016-03-01 VITALS — BP 117/79 | HR 86 | Wt 200.2 lb

## 2016-03-01 DIAGNOSIS — O0990 Supervision of high risk pregnancy, unspecified, unspecified trimester: Secondary | ICD-10-CM

## 2016-03-01 DIAGNOSIS — O99343 Other mental disorders complicating pregnancy, third trimester: Secondary | ICD-10-CM

## 2016-03-01 DIAGNOSIS — F250 Schizoaffective disorder, bipolar type: Secondary | ICD-10-CM

## 2016-03-01 DIAGNOSIS — O99350 Diseases of the nervous system complicating pregnancy, unspecified trimester: Principal | ICD-10-CM

## 2016-03-01 DIAGNOSIS — O99353 Diseases of the nervous system complicating pregnancy, third trimester: Secondary | ICD-10-CM

## 2016-03-01 DIAGNOSIS — G40909 Epilepsy, unspecified, not intractable, without status epilepticus: Secondary | ICD-10-CM

## 2016-03-01 NOTE — Progress Notes (Signed)
   PRENATAL VISIT NOTE  Subjective:  [name] is a 26 y.o. G2P0010 at [redacted]w[redacted]d being seen today for ongoing prenatal care.  She is currently monitored for the following issues for this high-risk pregnancy and has Autism spectrum disorder; Schizoaffective disorder, bipolar type by history(HCC); Borderline personality disorder; PTSD (post-traumatic stress disorder); Asthma; Chronic migraine; Supervision of high risk pregnancy, antepartum; Seizure disorder ([place]); Seizure disorder in pregnancy, antepartum ([place]); Rh negative, antepartum; 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; and Tachycardia with heart rate 121-140 beats per minute on her problem list.  Patient reports contractions since 2 days ago.  Contractions: Irritability. Vag. Bleeding: None.  Movement: Present. Denies leaking of fluid.   The following portions of the patient's history were reviewed and updated as appropriate: allergies, current medications, past family history, past medical history, past social history, past surgical history and problem list. Problem list updated.  Objective:   Vitals:   03/01/16 1548  BP: 117/79  Pulse: 86  Weight: 200 lb 3.2 oz (90.8 kg)    Fetal Status: Fetal Heart Rate (bpm): 130 Fundal Height: 38 cm Movement: Present  Presentation: Vertex  General:  Alert, oriented and cooperative. Patient is in no acute distress.  Skin: Skin is warm and dry. No rash noted.   Cardiovascular: Normal heart rate noted  Respiratory: Normal respiratory effort, no problems with respiration noted  Abdomen: Soft, gravid, appropriate for gestational age. Pain/Pressure: Present     Pelvic:  Cervical exam performed Dilation: 1 Effacement (%): 50    Extremities: Normal range of motion.  Edema: Trace  Mental Status: Normal mood and affect. Normal behavior. Normal judgment and thought content.   Assessment and Plan:  Pregnancy: G2P0010 at [redacted]w[redacted]d  1. Seizure disorder in pregnancy, antepartum ([place]) Continue Keppra  2.  Schizoaffective disorder, bipolar type by history(HCC) Continue risperdal  3. Supervision of high risk pregnancy, antepartum Labor precautions reviewed.  Term labor symptoms and general obstetric precautions including but not limited to vaginal bleeding, contractions, leaking of fluid and fetal movement were reviewed in detail with the patient. Please refer to After Visit Summary for other counseling recommendations.  Return in 1 week (on 03/08/2016).   Donnamae Jude, MD

## 2016-03-01 NOTE — Patient Instructions (Signed)
Third Trimester of Pregnancy The third trimester is from week 29 through week 40 (months 7 through 9). The third trimester is a time when the unborn baby (fetus) is growing rapidly. At the end of the ninth month, the fetus is about 20 inches in length and weighs 6-10 pounds. Body changes during your third trimester Your body goes through many changes during pregnancy. The changes vary from woman to woman. During the third trimester:  Your weight will continue to increase. You can expect to gain 25-35 pounds (11-16 kg) by the end of the pregnancy.  You may begin to get stretch marks on your hips, abdomen, and breasts.  You may urinate more often because the fetus is moving lower into your pelvis and pressing on your bladder.  You may develop or continue to have heartburn. This is caused by increased hormones that slow down muscles in the digestive tract.  You may develop or continue to have constipation because increased hormones slow digestion and cause the muscles that push waste through your intestines to relax.  You may develop hemorrhoids. These are swollen veins (varicose veins) in the rectum that can itch or be painful.  You may develop swollen, bulging veins (varicose veins) in your legs.  You may have increased body aches in the pelvis, back, or thighs. This is due to weight gain and increased hormones that are relaxing your joints.  You may have changes in your hair. These can include thickening of your hair, rapid growth, and changes in texture. Some women also have hair loss during or after pregnancy, or hair that feels dry or thin. Your hair will most likely return to normal after your baby is born.  Your breasts will continue to grow and they will continue to become tender. A yellow fluid (colostrum) may leak from your breasts. This is the first milk you are producing for your baby.  Your belly button may stick out.  You may notice more swelling in your hands, face, or  ankles.  You may have increased tingling or numbness in your hands, arms, and legs. The skin on your belly may also feel numb.  You may feel short of breath because of your expanding uterus.  You may have more problems sleeping. This can be caused by the size of your belly, increased need to urinate, and an increase in your body's metabolism.  You may notice the fetus "dropping," or moving lower in your abdomen.  You may have increased vaginal discharge.  Your cervix becomes thin and soft (effaced) near your due date. What to expect at prenatal visits You will have prenatal exams every 2 weeks until week 36. Then you will have weekly prenatal exams. During a routine prenatal visit:  You will be weighed to make sure you and the fetus are growing normally.  Your blood pressure will be taken.  Your abdomen will be measured to track your baby's growth.  The fetal heartbeat will be listened to.  Any test results from the previous visit will be discussed.  You may have a cervical check near your due date to see if you have effaced. At around 36 weeks, your health care provider will check your cervix. At the same time, your health care provider will also perform a test on the secretions of the vaginal tissue. This test is to determine if a type of bacteria, Group B streptococcus, is present. Your health care provider will explain this further. Your health care provider may ask you:    What your birth plan is.  How you are feeling.  If you are feeling the baby move.  If you have had any abnormal symptoms, such as leaking fluid, bleeding, severe headaches, or abdominal cramping.  If you are using any tobacco products, including cigarettes, chewing tobacco, and electronic cigarettes.  If you have any questions. Other tests or screenings that may be performed during your third trimester include:  Blood tests that check for low iron levels (anemia).  Fetal testing to check the health,  activity level, and growth of the fetus. Testing is done if you have certain medical conditions or if there are problems during the pregnancy.  Nonstress test (NST). This test checks the health of your baby to make sure there are no signs of problems, such as the baby not getting enough oxygen. During this test, a belt is placed around your belly. The baby is made to move, and its heart rate is monitored during movement. What is false labor? False labor is a condition in which you feel small, irregular tightenings of the muscles in the womb (contractions) that eventually go away. These are called Braxton Hicks contractions. Contractions may last for hours, days, or even weeks before true labor sets in. If contractions come at regular intervals, become more frequent, increase in intensity, or become painful, you should see your health care provider. What are the signs of labor?  Abdominal cramps.  Regular contractions that start at 10 minutes apart and become stronger and more frequent with time.  Contractions that start on the top of the uterus and spread down to the lower abdomen and back.  Increased pelvic pressure and dull back pain.  A watery or bloody mucus discharge that comes from the vagina.  Leaking of amniotic fluid. This is also known as your "water breaking." It could be a slow trickle or a gush. Let your doctor know if it has a color or strange odor. If you have any of these signs, call your health care provider right away, even if it is before your due date. Follow these instructions at home: Eating and drinking  Continue to eat regular, healthy meals.  Do not eat:  Raw meat or meat spreads.  Unpasteurized milk or cheese.  Unpasteurized juice.  Store-made salad.  Refrigerated smoked seafood.  Hot dogs or deli meat, unless they are piping hot.  More than 6 ounces of albacore tuna a week.  Shark, swordfish, king mackerel, or tile fish.  Store-made salads.  Raw  sprouts, such as mung bean or alfalfa sprouts.  Take prenatal vitamins as told by your health care provider.  Take 1000 mg of calcium daily as told by your health care provider.  If you develop constipation:  Take over-the-counter or prescription medicines.  Drink enough fluid to keep your urine clear or pale yellow.  Eat foods that are high in fiber, such as fresh fruits and vegetables, whole grains, and beans.  Limit foods that are high in fat and processed sugars, such as fried and sweet foods. Activity  Exercise only as directed by your health care provider. Healthy pregnant women should aim for 2 hours and 30 minutes of moderate exercise per week. If you experience any pain or discomfort while exercising, stop.  Avoid heavy lifting.  Do not exercise in extreme heat or humidity, or at high altitudes.  Wear low-heel, comfortable shoes.  Practice good posture.  Do not travel far distances unless it is absolutely necessary and only with the approval   of your health care provider.  Wear your seat belt at all times while in a car, on a bus, or on a plane.  Take frequent breaks and rest with your legs elevated if you have leg cramps or low back pain.  Do not use hot tubs, steam rooms, or saunas.  You may continue to have sex unless your health care provider tells you otherwise. Lifestyle  Do not use any products that contain nicotine or tobacco, such as cigarettes and e-cigarettes. If you need help quitting, ask your health care provider.  Do not drink alcohol.  Do not use any medicinal herbs or unprescribed drugs. These chemicals affect the formation and growth of the baby.  If you develop varicose veins:  Wear support pantyhose or compression stockings as told by your healthcare provider.  Elevate your feet for 15 minutes, 3-4 times a day.  Wear a supportive maternity bra to help with breast tenderness. General instructions  Take over-the-counter and prescription  medicines only as told by your health care provider. There are medicines that are either safe or unsafe to take during pregnancy.  Take warm sitz baths to soothe any pain or discomfort caused by hemorrhoids. Use hemorrhoid cream or witch hazel if your health care provider approves.  Avoid cat litter boxes and soil used by cats. These carry germs that can cause birth defects in the baby. If you have a cat, ask someone to clean the litter box for you.  To prepare for the arrival of your baby:  Take prenatal classes to understand, practice, and ask questions about the labor and delivery.  Make a trial run to the hospital.  Visit the hospital and tour the maternity area.  Arrange for maternity or paternity leave through employers.  Arrange for family and friends to take care of pets while you are in the hospital.  Purchase a rear-facing car seat and make sure you know how to install it in your car.  Pack your hospital bag.  Prepare the baby's nursery. Make sure to remove all pillows and stuffed animals from the baby's crib to prevent suffocation.  Visit your dentist if you have not gone during your pregnancy. Use a soft toothbrush to brush your teeth and be gentle when you floss.  Keep all prenatal follow-up visits as told by your health care provider. This is important. Contact a health care provider if:  You are unsure if you are in labor or if your water has broken.  You become dizzy.  You have mild pelvic cramps, pelvic pressure, or nagging pain in your abdominal area.  You have lower back pain.  You have persistent nausea, vomiting, or diarrhea.  You have an unusual or bad smelling vaginal discharge.  You have pain when you urinate. Get help right away if:  You have a fever.  You are leaking fluid from your vagina.  You have spotting or bleeding from your vagina.  You have severe abdominal pain or cramping.  You have rapid weight loss or weight gain.  You have  shortness of breath with chest pain.  You notice sudden or extreme swelling of your face, hands, ankles, feet, or legs.  Your baby makes fewer than 10 movements in 2 hours.  You have severe headaches that do not go away with medicine.  You have vision changes. Summary  The third trimester is from week 29 through week 40, months 7 through 9. The third trimester is a time when the unborn baby (fetus)   is growing rapidly.  During the third trimester, your discomfort may increase as you and your baby continue to gain weight. You may have abdominal, leg, and back pain, sleeping problems, and an increased need to urinate.  During the third trimester your breasts will keep growing and they will continue to become tender. A yellow fluid (colostrum) may leak from your breasts. This is the first milk you are producing for your baby.  False labor is a condition in which you feel small, irregular tightenings of the muscles in the womb (contractions) that eventually go away. These are called Braxton Hicks contractions. Contractions may last for hours, days, or even weeks before true labor sets in.  Signs of labor can include: abdominal cramps; regular contractions that start at 10 minutes apart and become stronger and more frequent with time; watery or bloody mucus discharge that comes from the vagina; increased pelvic pressure and dull back pain; and leaking of amniotic fluid. This information is not intended to replace advice given to you by your health care provider. Make sure you discuss any questions you have with your health care provider. Document Released: 01/16/2001 Document Revised: 06/30/2015 Document Reviewed: 03/25/2012 [employer] Interactive Patient Education  2017 [employer]   Breastfeeding Deciding to breastfeed is one of the best choices you can make for you and your baby. A change in hormones during pregnancy causes your breast tissue to grow and increases the number and size of your  milk ducts. These hormones also allow proteins, sugars, and fats from your blood supply to make breast milk in your milk-producing glands. Hormones prevent breast milk from being released before your baby is born as well as prompt milk flow after birth. Once breastfeeding has begun, thoughts of your baby, as well as his or her sucking or crying, can stimulate the release of milk from your milk-producing glands. Benefits of breastfeeding For Your Baby  Your first milk (colostrum) helps your baby's digestive system function better.  There are antibodies in your milk that help your baby fight off infections.  Your baby has a lower incidence of asthma, allergies, and sudden infant death syndrome.  The nutrients in breast milk are better for your baby than infant formulas and are designed uniquely for your baby's needs.  Breast milk improves your baby's brain development.  Your baby is less likely to develop other conditions, such as childhood obesity, asthma, or type 2 diabetes mellitus. For You  Breastfeeding helps to create a very special bond between you and your baby.  Breastfeeding is convenient. Breast milk is always available at the correct temperature and costs nothing.  Breastfeeding helps to burn calories and helps you lose the weight gained during pregnancy.  Breastfeeding makes your uterus contract to its prepregnancy size faster and slows bleeding (lochia) after you give birth.  Breastfeeding helps to lower your risk of developing type 2 diabetes mellitus, osteoporosis, and breast or ovarian cancer later in life. Signs that your baby is hungry Early Signs of Hunger  Increased alertness or activity.  Stretching.  Movement of the head from side to side.  Movement of the head and opening of the mouth when the corner of the mouth or cheek is stroked (rooting).  Increased sucking sounds, smacking lips, cooing, sighing, or squeaking.  Hand-to-mouth movements.  Increased  sucking of fingers or hands. Late Signs of Hunger  Fussing.  Intermittent crying. Extreme Signs of Hunger  Signs of extreme hunger will require calming and consoling before your baby will   be able to breastfeed successfully. Do not wait for the following signs of extreme hunger to occur before you initiate breastfeeding:  Restlessness.  A loud, strong cry.  Screaming. Breastfeeding basics  Breastfeeding Initiation  Find a comfortable place to sit or lie down, with your neck and back well supported.  Place a pillow or rolled up blanket under your baby to bring him or her to the level of your breast (if you are seated). Nursing pillows are specially designed to help support your arms and your baby while you breastfeed.  Make sure that your baby's abdomen is facing your abdomen.  Gently massage your breast. With your fingertips, massage from your chest wall toward your nipple in a circular motion. This encourages milk flow. You may need to continue this action during the feeding if your milk flows slowly.  Support your breast with 4 fingers underneath and your thumb above your nipple. Make sure your fingers are well away from your nipple and your baby's mouth.  Stroke your baby's lips gently with your finger or nipple.  When your baby's mouth is open wide enough, quickly bring your baby to your breast, placing your entire nipple and as much of the colored area around your nipple (areola) as possible into your baby's mouth.  More areola should be visible above your baby's upper lip than below the lower lip.  Your baby's tongue should be between his or her lower gum and your breast.  Ensure that your baby's mouth is correctly positioned around your nipple (latched). Your baby's lips should create a seal on your breast and be turned out (everted).  It is common for your baby to suck about 2-3 minutes in order to start the flow of breast milk. Latching  Teaching your baby how to latch  on to your breast properly is very important. An improper latch can cause nipple pain and decreased milk supply for you and poor weight gain in your baby. Also, if your baby is not latched onto your nipple properly, he or she may swallow some air during feeding. This can make your baby fussy. Burping your baby when you switch breasts during the feeding can help to get rid of the air. However, teaching your baby to latch on properly is still the best way to prevent fussiness from swallowing air while breastfeeding. Signs that your baby has successfully latched on to your nipple:  Silent tugging or silent sucking, without causing you pain.  Swallowing heard between every 3-4 sucks.  Muscle movement above and in front of his or her ears while sucking. Signs that your baby has not successfully latched on to nipple:  Sucking sounds or smacking sounds from your baby while breastfeeding.  Nipple pain. If you think your baby has not latched on correctly, slip your finger into the corner of your baby's mouth to break the suction and place it between your baby's gums. Attempt breastfeeding initiation again. Signs of Successful Breastfeeding  Signs from your baby:  A gradual decrease in the number of sucks or complete cessation of sucking.  Falling asleep.  Relaxation of his or her body.  Retention of a small amount of milk in his or her mouth.  Letting go of your breast by himself or herself. Signs from you:  Breasts that have increased in firmness, weight, and size 1-3 hours after feeding.  Breasts that are softer immediately after breastfeeding.  Increased milk volume, as well as a change in milk consistency and color by   the fifth day of breastfeeding.  Nipples that are not sore, cracked, or bleeding. Signs That Your Baby is Getting Enough Milk  Wetting at least 1-2 diapers during the first 24 hours after birth.  Wetting at least 5-6 diapers every 24 hours for the first week after  birth. The urine should be clear or pale yellow by 5 days after birth.  Wetting 6-8 diapers every 24 hours as your baby continues to grow and develop.  At least 3 stools in a 24-hour period by age 5 days. The stool should be soft and yellow.  At least 3 stools in a 24-hour period by age 7 days. The stool should be seedy and yellow.  No loss of weight greater than 10% of birth weight during the first 3 days of age.  Average weight gain of 4-7 ounces (113-198 g) per week after age 4 days.  Consistent daily weight gain by age 5 days, without weight loss after the age of 2 weeks. After a feeding, your baby may spit up a small amount. This is common. Breastfeeding frequency and duration Frequent feeding will help you make more milk and can prevent sore nipples and breast engorgement. Breastfeed when you feel the need to reduce the fullness of your breasts or when your baby shows signs of hunger. This is called "breastfeeding on demand." Avoid introducing a pacifier to your baby while you are working to establish breastfeeding (the first 4-6 weeks after your baby is born). After this time you may choose to use a pacifier. Research has shown that pacifier use during the first year of a baby's life decreases the risk of sudden infant death syndrome (SIDS). Allow your baby to feed on each breast as long as he or she wants. Breastfeed until your baby is finished feeding. When your baby unlatches or falls asleep while feeding from the first breast, offer the second breast. Because newborns are often sleepy in the first few weeks of life, you may need to awaken your baby to get him or her to feed. Breastfeeding times will vary from baby to baby. However, the following rules can serve as a guide to help you ensure that your baby is properly fed:  Newborns (babies 4 weeks of age or younger) may breastfeed every 1-3 hours.  Newborns should not go longer than 3 hours during the day or 5 hours during the night  without breastfeeding.  You should breastfeed your baby a minimum of 8 times in a 24-hour period until you begin to introduce solid foods to your baby at around 6 months of age. Breast milk pumping Pumping and storing breast milk allows you to ensure that your baby is exclusively fed your breast milk, even at times when you are unable to breastfeed. This is especially important if you are going back to work while you are still breastfeeding or when you are not able to be present during feedings. Your lactation consultant can give you guidelines on how long it is safe to store breast milk. A breast pump is a machine that allows you to pump milk from your breast into a sterile bottle. The pumped breast milk can then be stored in a refrigerator or freezer. Some breast pumps are operated by hand, while others use electricity. Ask your lactation consultant which type will work best for you. Breast pumps can be purchased, but some hospitals and breastfeeding support groups lease breast pumps on a monthly basis. A lactation consultant can teach you how to   hand express breast milk, if you prefer not to use a pump. Caring for your breasts while you breastfeed Nipples can become dry, cracked, and sore while breastfeeding. The following recommendations can help keep your breasts moisturized and healthy:  Avoid using soap on your nipples.  Wear a supportive bra. Although not required, special nursing bras and tank tops are designed to allow access to your breasts for breastfeeding without taking off your entire bra or top. Avoid wearing underwire-style bras or extremely tight bras.  Air dry your nipples for 3-4minutes after each feeding.  Use only cotton bra pads to absorb leaked breast milk. Leaking of breast milk between feedings is normal.  Use lanolin on your nipples after breastfeeding. Lanolin helps to maintain your skin's normal moisture barrier. If you use pure lanolin, you do not need to wash it off  before feeding your baby again. Pure lanolin is not toxic to your baby. You may also hand express a few drops of breast milk and gently massage that milk into your nipples and allow the milk to air dry. In the first few weeks after giving birth, some women experience extremely full breasts (engorgement). Engorgement can make your breasts feel heavy, warm, and tender to the touch. Engorgement peaks within 3-5 days after you give birth. The following recommendations can help ease engorgement:  Completely empty your breasts while breastfeeding or pumping. You may want to start by applying warm, moist heat (in the shower or with warm water-soaked hand towels) just before feeding or pumping. This increases circulation and helps the milk flow. If your baby does not completely empty your breasts while breastfeeding, pump any extra milk after he or she is finished.  Wear a snug bra (nursing or regular) or tank top for 1-2 days to signal your body to slightly decrease milk production.  Apply ice packs to your breasts, unless this is too uncomfortable for you.  Make sure that your baby is latched on and positioned properly while breastfeeding. If engorgement persists after 48 hours of following these recommendations, contact your health care provider or a lactation consultant. Overall health care recommendations while breastfeeding  Eat healthy foods. Alternate between meals and snacks, eating 3 of each per day. Because what you eat affects your breast milk, some of the foods may make your baby more irritable than usual. Avoid eating these foods if you are sure that they are negatively affecting your baby.  Drink milk, fruit juice, and water to satisfy your thirst (about 10 glasses a day).  Rest often, relax, and continue to take your prenatal vitamins to prevent fatigue, stress, and anemia.  Continue breast self-awareness checks.  Avoid chewing and smoking tobacco. Chemicals from cigarettes that pass  into breast milk and exposure to secondhand smoke may harm your baby.  Avoid alcohol and drug use, including marijuana. Some medicines that may be harmful to your baby can pass through breast milk. It is important to ask your health care provider before taking any medicine, including all over-the-counter and prescription medicine as well as vitamin and herbal supplements. It is possible to become pregnant while breastfeeding. If birth control is desired, ask your health care provider about options that will be safe for your baby. Contact a health care provider if:  You feel like you want to stop breastfeeding or have become frustrated with breastfeeding.  You have painful breasts or nipples.  Your nipples are cracked or bleeding.  Your breasts are red, tender, or warm.  You have   a swollen area on either breast.  You have a fever or chills.  You have nausea or vomiting.  You have drainage other than breast milk from your nipples.  Your breasts do not become full before feedings by the fifth day after you give birth.  You feel sad and depressed.  Your baby is too sleepy to eat well.  Your baby is having trouble sleeping.  Your baby is wetting less than 3 diapers in a 24-hour period.  Your baby has less than 3 stools in a 24-hour period.  Your baby's skin or the white part of his or her eyes becomes yellow.  Your baby is not gaining weight by 5 days of age. Get help right away if:  Your baby is overly tired (lethargic) and does not want to wake up and feed.  Your baby develops an unexplained fever. This information is not intended to replace advice given to you by your health care provider. Make sure you discuss any questions you have with your health care provider. Document Released: 01/22/2005 Document Revised: 07/06/2015 Document Reviewed: 07/16/2012 Elsevier Interactive Patient Education  2017 Elsevier Inc.  

## 2016-03-04 ENCOUNTER — Inpatient Hospital Stay (HOSPITAL_COMMUNITY)
Admission: AD | Admit: 2016-03-04 | Discharge: 2016-03-04 | Disposition: A | Discharge status: Home / Self Care | Payer: PPO | Source: Ambulatory Visit | Attending: Obstetrics and Gynecology | Admitting: Obstetrics and Gynecology

## 2016-03-04 ENCOUNTER — Inpatient Hospital Stay (HOSPITAL_COMMUNITY): Payer: PPO

## 2016-03-04 ENCOUNTER — Encounter (HOSPITAL_COMMUNITY): Payer: Self-pay | Admitting: *Deleted

## 2016-03-04 DIAGNOSIS — G250 Essential tremor: Secondary | ICD-10-CM | POA: Diagnosis not present

## 2016-03-04 DIAGNOSIS — F840 Autistic disorder: Secondary | ICD-10-CM | POA: Insufficient documentation

## 2016-03-04 DIAGNOSIS — O471 False labor at or after 37 completed weeks of gestation: Secondary | ICD-10-CM | POA: Diagnosis not present

## 2016-03-04 DIAGNOSIS — O99343 Other mental disorders complicating pregnancy, third trimester: Secondary | ICD-10-CM | POA: Insufficient documentation

## 2016-03-04 DIAGNOSIS — Z87891 Personal history of nicotine dependence: Secondary | ICD-10-CM | POA: Insufficient documentation

## 2016-03-04 DIAGNOSIS — O99513 Diseases of the respiratory system complicating pregnancy, third trimester: Secondary | ICD-10-CM | POA: Diagnosis not present

## 2016-03-04 DIAGNOSIS — O99613 Diseases of the digestive system complicating pregnancy, third trimester: Secondary | ICD-10-CM | POA: Diagnosis not present

## 2016-03-04 DIAGNOSIS — N898 Other specified noninflammatory disorders of vagina: Secondary | ICD-10-CM

## 2016-03-04 DIAGNOSIS — O26899 Other specified pregnancy related conditions, unspecified trimester: Secondary | ICD-10-CM

## 2016-03-04 DIAGNOSIS — Z3A38 38 weeks gestation of pregnancy: Secondary | ICD-10-CM | POA: Insufficient documentation

## 2016-03-04 DIAGNOSIS — K219 Gastro-esophageal reflux disease without esophagitis: Secondary | ICD-10-CM | POA: Insufficient documentation

## 2016-03-04 DIAGNOSIS — Z885 Allergy status to narcotic agent status: Secondary | ICD-10-CM | POA: Diagnosis not present

## 2016-03-04 DIAGNOSIS — Z91030 Bee allergy status: Secondary | ICD-10-CM | POA: Diagnosis not present

## 2016-03-04 DIAGNOSIS — Z888 Allergy status to other drugs, medicaments and biological substances status: Secondary | ICD-10-CM | POA: Diagnosis not present

## 2016-03-04 DIAGNOSIS — Z91018 Allergy to other foods: Secondary | ICD-10-CM | POA: Insufficient documentation

## 2016-03-04 DIAGNOSIS — O26893 Other specified pregnancy related conditions, third trimester: Secondary | ICD-10-CM | POA: Diagnosis not present

## 2016-03-04 DIAGNOSIS — Z6791 Unspecified blood type, Rh negative: Secondary | ICD-10-CM

## 2016-03-04 DIAGNOSIS — Z3689 Encounter for other specified antenatal screening: Secondary | ICD-10-CM

## 2016-03-04 DIAGNOSIS — Z148 Genetic carrier of other disease: Secondary | ICD-10-CM

## 2016-03-04 DIAGNOSIS — J45909 Unspecified asthma, uncomplicated: Secondary | ICD-10-CM | POA: Insufficient documentation

## 2016-03-04 LAB — AMNISURE RUPTURE OF MEMBRANE (ROM) NOT AT ARMC: AMNISURE: NEGATIVE

## 2016-03-04 MED ORDER — CYCLOBENZAPRINE HCL 10 MG PO TABS
10.0000 mg | ORAL_TABLET | Freq: Once | ORAL | Status: AC
  Administered 2016-03-04: 10 mg via ORAL
  Filled 2016-03-04: qty 1

## 2016-03-04 MED ORDER — LACTATED RINGERS IV SOLN
INTRAVENOUS | Status: DC
  Administered 2016-03-04: 02:00:00 via INTRAVENOUS

## 2016-03-04 NOTE — Discharge Instructions (Signed)
Round Ligament Pain Introduction The round ligament is a cord of muscle and tissue that helps to support the uterus. It can become a source of pain during pregnancy if it becomes stretched or twisted as the baby grows. The pain usually begins in the second trimester of pregnancy, and it can come and go until the baby is delivered. It is not a serious problem, and it does not cause harm to the baby. Round ligament pain is usually a short, sharp, and pinching pain, but it can also be a dull, lingering, and aching pain. The pain is felt in the lower side of the abdomen or in the groin. It usually starts deep in the groin and moves up to the outside of the hip area. Pain can occur with:  A sudden change in position.  Rolling over in bed.  Coughing or sneezing.  Physical activity. Follow these instructions at home: Watch your condition for any changes. Take these steps to help with your pain:  When the pain starts, relax. Then try:  Sitting down.  Flexing your knees up to your abdomen.  Lying on your side with one pillow under your abdomen and another pillow between your legs.  Sitting in a warm bath for 15-20 minutes or until the pain goes away.  Take over-the-counter and prescription medicines only as told by your health care provider.  Move slowly when you sit and stand.  Avoid long walks if they cause pain.  Stop or lessen your physical activities if they cause pain. Contact a health care provider if:  Your pain does not go away with treatment.  You feel pain in your back that you did not have before.  Your medicine is not helping. Get help right away if:  You develop a fever or chills.  You develop uterine contractions.  You develop vaginal bleeding.  You develop nausea or vomiting.  You develop diarrhea.  You have pain when you urinate. This information is not intended to replace advice given to you by your health care provider. Make sure you discuss any questions  you have with your health care provider. Document Released: 11/01/2007 Document Revised: 06/30/2015 Document Reviewed: 03/31/2014  2017 Elsevier  

## 2016-03-04 NOTE — MAU Provider Note (Signed)
History   [id number]   Chief Complaint  Patient presents with  . Contractions    HPI [name] is a 26 y.o. female  G2P0010 at [redacted]w[redacted]d IUP here with report of lower groin pain and back pain that started around 1500 yesterday.  Pain is described as contractions.  Noticed a popping sensation around 0830 and small amount of clear fluid.  +intercourse in past 24 hours.  +spotting of fluid.  Pain not present when not having contractions.  Decreased fetal movement reported.  Patient's last menstrual period was 06/07/2015.  OB History  Gravida Para Term Preterm AB Living  2       1 0  SAB TAB Ectopic Multiple Live Births  1            # Outcome Date GA Lbr Len/2nd Weight Sex Delivery Anes PTL Lv  2 Current           1 SAB 2010              Past Medical History:  Diagnosis Date  . Acid reflux   . Asthma   . Autism   . 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 Chronic constipation   . Depression   . Drug-seeking behavior   . Essential tremor   . Personality disorder   . Schizo-affective psychosis ([place])   . Schizoaffective disorder, bipolar type ([place])   . Seizures [facility])    Last seizure November 2017    Family History  Problem Relation Age of Onset  . Mental illness Father   . Asthma Father   . PDD Brother   . Seizures Brother     Social History   Social History  . Marital status: Married    Spouse name: N/A  . Number of children: 0  . Years of education: N/A   Occupational History  . disability    Social History Main Topics  . Smoking status: Former Smoker    Packs/day: 0.00  . Smokeless tobacco: Never Used     Comment: Smoked for 2  years age [age]  . Alcohol use 0.6 oz/week    1 Standard drinks or equivalent per week     Comment: "not with preg"  . Drug use: No     Comment: History of cocaine use at age 3 for 4 months  . Sexual activity: Yes    Birth control/ protection: None   Other Topics Concern  . None   Social History Narrative   Marital status:  married; guardian is Furniture conservator/restorer      Children: none      Lives: with husband in apartment      Employment:  Disability      Tobacco: quit smoking; smoked for two years.      Alcohol ;none      Drugs: none            Allergies  Allergen Reactions  . Bee Venom Anaphylaxis  . Coconut Flavor Anaphylaxis  . Geodon [Ziprasidone Hcl] Other (See Comments)    Pt states that this medication causes paralysis of the mouth.    . Haloperidol And Related Other (See Comments)    Pt states that this medication causes paralysis of the mouth.    . Lithium Other (See Comments)    Reaction:  Seizure-like activity.    . Oxycodone Other (See Comments)    Reaction:  Hallucinations   . Seroquel [Quetiapine Fumarate] Other (See Comments)  Pt states that this medication is too strong.   . Shellfish Allergy Anaphylaxis  . Percocet [Oxycodone-Acetaminophen] Other (See Comments)    Reaction:  Hallucinations   . Phenergan [Promethazine Hcl] Other (See Comments)    Reaction:  Chest pain    . Prilosec [Omeprazole] Nausea And Vomiting  . Tegretol [Carbamazepine] Nausea And Vomiting    No current facility-administered medications on file prior to encounter.    Current Outpatient Prescriptions on File Prior to Encounter  Medication Sig Dispense Refill  . acetaminophen (TYLENOL) 500 MG tablet Take 1,000 mg by mouth every 6 (six) hours as needed for mild pain, moderate pain or headache.    . albuterol (PROVENTIL HFA;VENTOLIN HFA) 108 (90 Base) MCG/ACT inhaler Inhale 1-2 puffs into the lungs every 6 (six) hours as needed for wheezing or shortness of breath.    . Benzocaine (ORAJEL MT) Use as directed in the mouth or throat.    . calcium carbonate (TUMS - DOSED IN MG ELEMENTAL CALCIUM) 500 MG chewable tablet Chew 2 tablets by mouth as needed for indigestion or heartburn.     . diphenhydrAMINE (BENADRYL) 25 mg capsule Take 50 mg by mouth at bedtime as needed for sleep.     Marland Kitchen docusate sodium (COLACE) 100 MG  capsule Take 200 mg by mouth daily.     . Ergocalciferol (VITAMIN D2) 400 units TABS Take 800 Units by mouth daily.    . fluticasone-salmeterol (ADVAIR HFA) 115-21 MCG/ACT inhaler Inhale 2 puffs into the lungs 2 (two) times daily. 1 Inhaler 5  . labetalol (NORMODYNE) 100 MG tablet Take 1 tablet (100 mg total) by mouth 2 (two) times daily. 60 tablet 2  . levETIRAcetam (KEPPRA) 750 MG tablet Take 1 tablet (750 mg total) by mouth 3 (three) times daily. 90 tablet 11  . Menthol, Topical Analgesic, (BENGAY EX) Apply 1 application topically as needed (for pain).     . Prenatal Vit-Fe Fumarate-FA (PRENATAL COMPLETE) 14-0.4 MG TABS Take 1 tablet by mouth at bedtime.    . pyridOXINE (VITAMIN B-6) 100 MG tablet Take 200 mg by mouth 2 (two) times daily.    . ranitidine (ZANTAC) 150 MG tablet Take 150 mg by mouth 2 (two) times daily.    . risperiDONE (RISPERDAL) 2 MG tablet Take 2 mg by mouth 2 (two) times daily.   0  . senna (SENOKOT) 8.6 MG tablet Take 2 tablets by mouth at bedtime.     . sertraline (ZOLOFT) 25 MG tablet Take 50 mg by mouth daily.   0     Review of Systems  Constitutional: Negative for chills and fever.  Genitourinary: Positive for pelvic pain, vaginal bleeding (spotting) and vaginal discharge. Negative for dyspareunia, dysuria and flank pain.     Physical Exam   Vitals:   03/04/16 0042  BP: 120/68  Pulse: 112  Resp: 18  Temp: 98.4 F (36.9 C)  Weight: 92.9 kg (204 lb 12.8 oz)  Height: 5\' 5"  (1.651 m)    Physical Exam  Constitutional: She is oriented to person, place, and time. She appears well-developed and well-nourished.  HENT:  Head: Normocephalic.  Neck: Normal range of motion. Neck supple.  Cardiovascular: Normal rate, regular rhythm and normal heart sounds.   Respiratory: Effort normal and breath sounds normal. No respiratory distress.  GI: Soft. There is no tenderness.  Genitourinary: No bleeding in the vagina. Vaginal discharge (mucusy) found.   Musculoskeletal: Normal range of motion. She exhibits no edema.  Neurological: She is alert and  oriented to person, place, and time.  Skin: Skin is warm and dry.   Dilation: 1 Effacement (%): 70 Cervical Position: [place]: -3 Presentation: Vertex Exam by:: B Mosca RN  Results for orders placed or performed during the hospital encounter of 03/04/16 (from the past 24 hour(s))  Amnisure rupture of membrane (rom)not at [facility]     Status: None   Collection Time: 03/04/16  1:32 AM  Result Value Ref Range   Amnisure ROM NEGATIVE    FHR 132, +accels, decel to 60's x 1 min at approximately 0129; returns to baseline, position change and Dr. Elly Modena notified by RN staff  0300 Consulted with Dr. Elly Modena. discussed HPI, fetal monitoring/no additional prolonged decelerations and reactive NST/AFI results> may discharge home  MAU Course  Procedures  MDM Preliminary AFI:  11.53  Assessment and Plan  26 y.o. G2P0010 at [redacted]w[redacted]d IUP  Reactive NST Contractions - no cervical change  Plan: Discharge home Reviewed labor precautions Keep BPP in two and appt on 03/07/16  Gwen Pounds, CNM 03/04/2016 0520 AM

## 2016-03-04 NOTE — MAU Note (Signed)
Pt reports reports strong contractions since 3pm coming every 10-15 minutes. Pt felt a "pop" around 0830 and had a small amount of fluid leak at that time. Pt reports decreased movement today. Denies vaginal bleeding.

## 2016-03-04 NOTE — MAU Note (Signed)
Contractions since 1500. Felt a pop and leaked fld around 2000. 1cm on Thurs. Has seizure disorder. Last seizure Dec 2017

## 2016-03-06 ENCOUNTER — Emergency Department (HOSPITAL_COMMUNITY)
Admission: EM | Admit: 2016-03-06 | Discharge: 2016-03-06 | Discharge status: Against Medical Advice | Payer: PPO | Source: Home / Self Care | Attending: Emergency Medicine | Admitting: Emergency Medicine

## 2016-03-06 ENCOUNTER — Encounter (HOSPITAL_COMMUNITY): Payer: Self-pay | Admitting: Family Medicine

## 2016-03-06 ENCOUNTER — Encounter (HOSPITAL_COMMUNITY): Payer: Self-pay | Admitting: *Deleted

## 2016-03-06 ENCOUNTER — Inpatient Hospital Stay (HOSPITAL_COMMUNITY)
Admission: AD | Admit: 2016-03-06 | Discharge: 2016-03-06 | Disposition: A | Discharge status: Home / Self Care | Payer: PPO | Source: Ambulatory Visit | Attending: Obstetrics and Gynecology | Admitting: Obstetrics and Gynecology

## 2016-03-06 ENCOUNTER — Inpatient Hospital Stay (HOSPITAL_COMMUNITY): Payer: PPO

## 2016-03-06 DIAGNOSIS — O99343 Other mental disorders complicating pregnancy, third trimester: Secondary | ICD-10-CM | POA: Insufficient documentation

## 2016-03-06 DIAGNOSIS — O26899 Other specified pregnancy related conditions, unspecified trimester: Secondary | ICD-10-CM

## 2016-03-06 DIAGNOSIS — O368130 Decreased fetal movements, third trimester, not applicable or unspecified: Secondary | ICD-10-CM | POA: Diagnosis not present

## 2016-03-06 DIAGNOSIS — Z3A39 39 weeks gestation of pregnancy: Secondary | ICD-10-CM

## 2016-03-06 DIAGNOSIS — Z87891 Personal history of nicotine dependence: Secondary | ICD-10-CM

## 2016-03-06 DIAGNOSIS — F840 Autistic disorder: Secondary | ICD-10-CM

## 2016-03-06 DIAGNOSIS — O288 Other abnormal findings on antenatal screening of mother: Secondary | ICD-10-CM

## 2016-03-06 DIAGNOSIS — O26893 Other specified pregnancy related conditions, third trimester: Secondary | ICD-10-CM | POA: Insufficient documentation

## 2016-03-06 DIAGNOSIS — F329 Major depressive disorder, single episode, unspecified: Secondary | ICD-10-CM | POA: Insufficient documentation

## 2016-03-06 DIAGNOSIS — Q992 Fragile X chromosome: Secondary | ICD-10-CM | POA: Insufficient documentation

## 2016-03-06 DIAGNOSIS — Z6791 Unspecified blood type, Rh negative: Secondary | ICD-10-CM | POA: Diagnosis not present

## 2016-03-06 DIAGNOSIS — Z3493 Encounter for supervision of normal pregnancy, unspecified, third trimester: Secondary | ICD-10-CM

## 2016-03-06 DIAGNOSIS — Z148 Genetic carrier of other disease: Secondary | ICD-10-CM

## 2016-03-06 DIAGNOSIS — J45909 Unspecified asthma, uncomplicated: Secondary | ICD-10-CM

## 2016-03-06 DIAGNOSIS — F32A Depression, unspecified: Secondary | ICD-10-CM

## 2016-03-06 LAB — URINALYSIS, ROUTINE W REFLEX MICROSCOPIC
Bilirubin Urine: NEGATIVE
GLUCOSE, UA: NEGATIVE mg/dL
HGB URINE DIPSTICK: NEGATIVE
Ketones, ur: NEGATIVE mg/dL
Leukocytes, UA: NEGATIVE
Nitrite: NEGATIVE
PH: 7 (ref 5.0–8.0)
Protein, ur: NEGATIVE mg/dL
SPECIFIC GRAVITY, URINE: 1.013 (ref 1.005–1.030)

## 2016-03-06 NOTE — ED Notes (Signed)
Bed: [id number] Expected date:  Expected time:  Means of arrival:  Comments: Triage 4

## 2016-03-06 NOTE — ED Provider Notes (Signed)
[place] [facility] DEPT Provider Note   CSN: [id number] Arrival date & time: 03/06/16  2203 By signing my name below, I, Dyke Brackett, attest that this documentation has been prepared under the direction and in the presence of Merryl Hacker, MD . Electronically Signed: Dyke Brackett, Scribe. 03/06/2016. 11:29 PM.   History   Chief Complaint Chief Complaint  Patient presents with  . Psychiatric Evaluation   HPI [name] is a [redacted] week pregnant, G1 26 y.o. female who presents to the Emergency Department complaining of intermittent abdominal pain which she describes as contractions for 9 days. Pt states she is currently having contractions every 20 minutes and is currently 1 cm dilated. She was seen at the [facility] hospital tonight and had a non-stress test, but was sent home. She notes associated depression and states she feels "stuck" and is ready to deliver her child. Per pt, she is followed by ACT team who was supposed to increase her Zoloft. She is currently taking rispridol 2 mg and Zoloft 50 mg. She denies any SI/HI or any physical complaints.   The history is provided by the patient. No language interpreter was used.   Past Medical History:  Diagnosis Date  . Acid reflux   . Asthma   . Autism   . 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 Chronic constipation   . Depression   . Drug-seeking behavior   . Essential tremor   . Personality disorder   . Schizo-affective psychosis ([place])   . Schizoaffective disorder, bipolar type ([place])   . Seizures [facility])    Last seizure December 2017    Patient Active Problem List   Diagnosis Date Noted  . Tachycardia with heart rate 121-140 beats per minute 01/13/2016  . 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  . Rh negative, antepartum 08/10/2015  . Supervision of high risk pregnancy, antepartum 08/08/2015  . Seizure disorder in pregnancy, antepartum ([place]) 08/08/2015  . Chronic migraine 07/27/2015  . Asthma 04/15/2015  . Schizoaffective  disorder, bipolar type by history(HCC) 03/10/2014  . Borderline personality disorder 03/10/2014  . PTSD (post-traumatic stress disorder) 03/10/2014  . Autism spectrum disorder 06/15/2013    Past Surgical History:  Procedure Laterality Date  . MOUTH SURGERY      OB History    Gravida Para Term Preterm AB Living   2       1 0   SAB TAB Ectopic Multiple Live Births   1              Home Medications    Prior to Admission medications   Medication Sig Start Date End Date Taking? Authorizing Provider  acetaminophen (TYLENOL) 500 MG tablet Take 1,000 mg by mouth every 6 (six) hours as needed for mild pain, moderate pain or headache.   Yes Historical Provider, MD  albuterol (PROVENTIL HFA;VENTOLIN HFA) 108 (90 Base) MCG/ACT inhaler Inhale 1-2 puffs into the lungs every 6 (six) hours as needed for wheezing or shortness of breath.   Yes Historical Provider, MD  Benzocaine (ORAJEL MT) Use as directed in the mouth or throat.   Yes Historical Provider, MD  calcium carbonate (TUMS - DOSED IN MG ELEMENTAL CALCIUM) 500 MG chewable tablet Chew 2 tablets by mouth as needed for indigestion or heartburn.    Yes Historical Provider, MD  diphenhydrAMINE (BENADRYL) 25 mg capsule Take 50 mg by mouth at bedtime as needed for sleep.    Yes Historical Provider, MD  docusate sodium (COLACE) 100 MG capsule  Take 200 mg by mouth daily.    Yes Historical Provider, MD  Ergocalciferol (VITAMIN D2) 400 units TABS Take 800 Units by mouth daily.   Yes Historical Provider, MD  fluticasone-salmeterol (ADVAIR HFA) 115-21 MCG/ACT inhaler Inhale 2 puffs into the lungs 2 (two) times daily. 08/03/15  Yes Timmothy Euler, MD  labetalol (NORMODYNE) 100 MG tablet Take 1 tablet (100 mg total) by mouth 2 (two) times daily. 01/24/16  Yes Evans Lance, MD  levETIRAcetam (KEPPRA) 750 MG tablet Take 1 tablet (750 mg total) by mouth 3 (three) times daily. 12/19/15  Yes Britt Bottom, MD  Menthol, Topical Analgesic, (BENGAY EX) Apply  1 application topically as needed (for pain).    Yes Historical Provider, MD  Prenatal Vit-Fe Fumarate-FA (PRENATAL MULTIVITAMIN) TABS tablet Take 1 tablet by mouth daily at 12 noon.   Yes Historical Provider, MD  pyridOXINE (VITAMIN B-6) 100 MG tablet Take 200 mg by mouth 2 (two) times daily.   Yes Historical Provider, MD  ranitidine (ZANTAC) 150 MG tablet Take 150 mg by mouth 2 (two) times daily.   Yes Historical Provider, MD  risperiDONE (RISPERDAL) 2 MG tablet Take 2 mg by mouth 2 (two) times daily.    Yes Historical Provider, MD  senna (SENOKOT) 8.6 MG tablet Take 2 tablets by mouth at bedtime.    Yes Historical Provider, MD  sertraline (ZOLOFT) 50 MG tablet Take 50 mg by mouth daily.   Yes Historical Provider, MD    Family History Family History  Problem Relation Age of Onset  . Mental illness Father   . Asthma Father   . PDD Brother   . Seizures Brother     Social History Social History  Substance Use Topics  . Smoking status: Former Smoker    Packs/day: 0.00  . Smokeless tobacco: Never Used     Comment: Smoked for 2  years age [age]  . Alcohol use No     Allergies   Bee venom; Coconut flavor; Geodon [ziprasidone hcl]; Haloperidol and related; Lithium; Oxycodone; Seroquel [quetiapine fumarate]; Shellfish allergy; Percocet [oxycodone-acetaminophen]; Phenergan [promethazine hcl]; Prilosec [omeprazole]; and Tegretol [carbamazepine]   Review of Systems Review of Systems  Gastrointestinal: Negative for abdominal pain.  Genitourinary: Negative for dysuria, vaginal bleeding, vaginal discharge and vaginal pain.  Psychiatric/Behavioral: Positive for dysphoric mood. Negative for suicidal ideas.  All other systems reviewed and are negative.   Physical Exam Updated Vital Signs BP 127/78   Pulse 106   Temp 98.2 F (36.8 C) (Oral)   Resp 18   LMP 06/07/2015   SpO2 97%   Physical Exam  Constitutional: She is oriented to person, place, and time. She appears well-developed  and well-nourished. No distress.  HENT:  Head: Normocephalic and atraumatic.  Cardiovascular: Normal rate, regular rhythm and normal heart sounds.   No murmur heard. Pulmonary/Chest: Effort normal and breath sounds normal. No respiratory distress. She has no wheezes.  Abdominal: Soft. Bowel sounds are normal. There is no tenderness. There is no guarding.  Gravid, no palpated contractions  Genitourinary:  Genitourinary Comments: Deferred, patient was evaluated at [facility] hospital just prior to arrival here and had a full cervical exam  Neurological: She is alert and oriented to person, place, and time.  Skin: Skin is warm and dry.  Psychiatric:  Flat affect  Nursing note and vitals reviewed.   ED Treatments / Results  DIAGNOSTIC STUDIES:  Oxygen Saturation is 97% on RA, normal by my interpretation.    COORDINATION OF  CARE:  11:17 PM Discussed treatment plan with pt at bedside and pt agreed to plan.  Labs (all labs ordered are listed, but only abnormal results are displayed) Labs Reviewed - No data to display  EKG  EKG Interpretation None       Radiology No results found.  Procedures Procedures (including critical care time)  Medications Ordered in ED Medications - No data to display   Initial Impression / Assessment and Plan / ED Course  I have reviewed the triage vital signs and the nursing notes.  Pertinent labs & imaging results that were available during my care of the patient were reviewed by me and considered in my medical decision making (see chart for details).     Patient presents requesting induction. She also expresses depression related to the end of her pregnancy. Denies suicidal ideation. She was just discharged from [facility] hospital. At that time she had a reactive NST and her cervical exam was 1 cm, -2, 50% effaced. She denies any recent changes in that. Do not feel she needs repeat cervical examination. I discussed at length with the patient why  she was not being electively induced at this time based on information from [facility] hospital. She appears frustrated. Prior to obtaining fetal heart tones, patient left without discharge paperwork.  After history, exam, and medical workup I feel the patient has been appropriately medically screened and is safe for discharge home. Pertinent diagnoses were discussed with the patient. Patient was given return precautions.   Final Clinical Impressions(s) / ED Diagnoses   Final diagnoses:  Depression, unspecified depression type  Third trimester pregnancy    New Prescriptions Discharge Medication List as of 03/06/2016 11:54 PM    I personally performed the services described in this documentation, which was scribed in my presence. The recorded information has been reviewed and is accurate.    Merryl Hacker, MD 03/07/16 [phone]

## 2016-03-06 NOTE — MAU Provider Note (Signed)
History   [id number]   Chief Complaint  Patient presents with  . Back Pain  . Decreased Fetal Movement    HPI [name] is a 26 y.o. female  G2P0010 here with report of decreased fetal movement since yesterday.  Reports feeling the baby move approximately 1 time today.  Denies vaginal bleeding or leaking of fluid.  Feels occasional contractions & low back pain.  Patient's last menstrual period was 06/07/2015.  OB History  Gravida Para Term Preterm AB Living  2       1 0  SAB TAB Ectopic Multiple Live Births  1            # Outcome Date GA Lbr Len/2nd Weight Sex Delivery Anes PTL Lv  2 Current           1 SAB 2010              Past Medical History:  Diagnosis Date  . Acid reflux   . Asthma   . Autism   . 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 Chronic constipation   . Depression   . Drug-seeking behavior   . Essential tremor   . Personality disorder   . Schizo-affective psychosis ([place])   . Schizoaffective disorder, bipolar type ([place])   . Seizures [facility])    Last seizure December 2017    Family History  Problem Relation Age of Onset  . Mental illness Father   . Asthma Father   . PDD Brother   . Seizures Brother     Social History   Social History  . Marital status: Married    Spouse name: N/A  . Number of children: 0  . Years of education: N/A   Occupational History  . disability    Social History Main Topics  . Smoking status: Former Smoker    Packs/day: 0.00  . Smokeless tobacco: Never Used     Comment: Smoked for 2  years age [age]  . Alcohol use 0.6 oz/week    1 Standard drinks or equivalent per week     Comment: "not with preg"  . Drug use: No     Comment: History of cocaine use at age 38 for 4 months  . Sexual activity: Yes    Birth control/ protection: None   Other Topics Concern  . None   Social History Narrative   Marital status: married; guardian is Furniture conservator/restorer      Children: none      Lives: with husband in apartment       Employment:  Disability      Tobacco: quit smoking; smoked for two years.      Alcohol ;none      Drugs: none            Allergies  Allergen Reactions  . Bee Venom Anaphylaxis  . Coconut Flavor Anaphylaxis  . Geodon [Ziprasidone Hcl] Other (See Comments)    Pt states that this medication causes paralysis of the mouth.    . Haloperidol And Related Other (See Comments)    Pt states that this medication causes paralysis of the mouth.    . Lithium Other (See Comments)    Reaction:  Seizure-like activity.    . Oxycodone Other (See Comments)    Reaction:  Hallucinations   . Seroquel [Quetiapine Fumarate] Other (See Comments)    Pt states that this medication is too strong.   . Shellfish Allergy Anaphylaxis  . Percocet [Oxycodone-Acetaminophen] Other (  See Comments)    Reaction:  Hallucinations   . Phenergan [Promethazine Hcl] Other (See Comments)    Reaction:  Chest pain    . Prilosec [Omeprazole] Nausea And Vomiting  . Tegretol [Carbamazepine] Nausea And Vomiting    No current facility-administered medications on file prior to encounter.    Current Outpatient Prescriptions on File Prior to Encounter  Medication Sig Dispense Refill  . acetaminophen (TYLENOL) 500 MG tablet Take 1,000 mg by mouth every 6 (six) hours as needed for mild pain, moderate pain or headache.    . albuterol (PROVENTIL HFA;VENTOLIN HFA) 108 (90 Base) MCG/ACT inhaler Inhale 1-2 puffs into the lungs every 6 (six) hours as needed for wheezing or shortness of breath.    . Benzocaine (ORAJEL MT) Use as directed in the mouth or throat.    . calcium carbonate (TUMS - DOSED IN MG ELEMENTAL CALCIUM) 500 MG chewable tablet Chew 2 tablets by mouth as needed for indigestion or heartburn.     . diphenhydrAMINE (BENADRYL) 25 mg capsule Take 50 mg by mouth at bedtime as needed for sleep.     Marland Kitchen docusate sodium (COLACE) 100 MG capsule Take 200 mg by mouth daily.     . Ergocalciferol (VITAMIN D2) 400 units TABS Take 800 Units  by mouth daily.    . fluticasone-salmeterol (ADVAIR HFA) 115-21 MCG/ACT inhaler Inhale 2 puffs into the lungs 2 (two) times daily. 1 Inhaler 5  . labetalol (NORMODYNE) 100 MG tablet Take 1 tablet (100 mg total) by mouth 2 (two) times daily. 60 tablet 2  . levETIRAcetam (KEPPRA) 750 MG tablet Take 1 tablet (750 mg total) by mouth 3 (three) times daily. 90 tablet 11  . Menthol, Topical Analgesic, (BENGAY EX) Apply 1 application topically as needed (for pain).     Marland Kitchen pyridOXINE (VITAMIN B-6) 100 MG tablet Take 200 mg by mouth 2 (two) times daily.    . ranitidine (ZANTAC) 150 MG tablet Take 150 mg by mouth 2 (two) times daily.    . risperiDONE (RISPERDAL) 2 MG tablet Take 2 mg by mouth 2 (two) times daily.   0  . senna (SENOKOT) 8.6 MG tablet Take 2 tablets by mouth at bedtime.        Review of Systems  Constitutional: Negative.   Gastrointestinal: Positive for abdominal pain.  Genitourinary: Negative for vaginal bleeding and vaginal discharge.  Musculoskeletal: Positive for back pain.     Physical Exam   Vitals:   03/06/16 1759  BP: 122/75  Pulse: 107  Resp: 20  Temp: 98.3 F (36.8 C)  TempSrc: Oral  Weight: 205 lb 8 oz (93.2 kg)  Height: 5\' 4"  (1.626 m)    Physical Exam  Nursing note and vitals reviewed. Constitutional: She is oriented to person, place, and time. She appears well-developed and well-nourished. No distress.  HENT:  Head: Normocephalic and atraumatic.  Eyes: Conjunctivae are normal. Right eye exhibits no discharge. Left eye exhibits no discharge. No scleral icterus.  Neck: Normal range of motion.  Respiratory: Effort normal. No respiratory distress.  Neurological: She is alert and oriented to person, place, and time.  Skin: Skin is warm and dry. She is not diaphoretic.  Psychiatric: She has a normal mood and affect. Her behavior is normal. Judgment and thought content normal.   Dilation: 1 Effacement (%): 50 Cervical Position: [place]:  -2 Presentation: Vertex Exam by:: Velna Ochs RN  Fetal Tracing:  Baseline: 150 Variability: minimal to moderate Accelerations: 10x10 Decelerations:variable  Toco: UI  MAU Course  Procedures Results for orders placed or performed during the hospital encounter of 03/06/16 (from the past 24 hour(s))  Urinalysis, Routine w reflex microscopic     Status: None   Collection Time: 03/06/16  6:03 PM  Result Value Ref Range   Color, Urine YELLOW YELLOW   APPearance CLEAR CLEAR   Specific Gravity, Urine 1.013 1.005 - 1.030   pH 7.0 5.0 - 8.0   Glucose, UA NEGATIVE NEGATIVE mg/dL   Hgb urine dipstick NEGATIVE NEGATIVE   Bilirubin Urine NEGATIVE NEGATIVE   Ketones, ur NEGATIVE NEGATIVE mg/dL   Protein, ur NEGATIVE NEGATIVE mg/dL   Nitrite NEGATIVE NEGATIVE   Leukocytes, UA NEGATIVE NEGATIVE    MDM Non reactive NST SVE unchanged from previous visit [facility]PP 8/8 with normal AFI S/w Dr. Ilda Basset who reviewed fetal tracing. Will monitor for additional 30 minutes after ultrasound and reassess.   Care turned over to [place], NP 03/06/2016 7:58 PM.   Reactive NST after return from [name]. Discussed findings with Dr. Ilda Basset. Stable for discharge home. Phone call from Duard Brady from [place] reports patient called him recently and states thoughts of harming herself. Pt did not report this to provider or staff. Pt had already been discharge (when phone call received) and was unable to be located in the hospital.    Assessment and Plan   1. NST (non-stress test) reactive   2. 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   3. Rh negative, antepartum   4. NST (non-stress test) nonreactive   5. [redacted] weeks gestation of pregnancy   6. Decreased fetal movement affecting management of pregnancy in third trimester, single or unspecified fetus   7. Decreased fetal movements in third trimester, single or unspecified fetus    Discharge home Follow up in [place] in 2 days as  scheduled FMCs  Allergies as of 03/06/2016      Reactions   Bee Venom Anaphylaxis   Coconut Flavor Anaphylaxis   Geodon [ziprasidone Hcl] Other (See Comments)   Pt states that this medication causes paralysis of the mouth.     Haloperidol And Related Other (See Comments)   Pt states that this medication causes paralysis of the mouth.     Lithium Other (See Comments)   Reaction:  Seizure-like activity.    Oxycodone Other (See Comments)   Reaction:  Hallucinations    Seroquel [quetiapine Fumarate] Other (See Comments)   Pt states that this medication is too strong.    Shellfish Allergy Anaphylaxis   Percocet [oxycodone-acetaminophen] Other (See Comments)   Reaction:  Hallucinations    Phenergan [promethazine Hcl] Other (See Comments)   Reaction:  Chest pain    Prilosec [omeprazole] Nausea And Vomiting   Tegretol [carbamazepine] Nausea And Vomiting      Medication List    TAKE these medications   acetaminophen 500 MG tablet Commonly known as:  TYLENOL Take 1,000 mg by mouth every 6 (six) hours as needed for mild pain, moderate pain or headache.   albuterol 108 (90 Base) MCG/ACT inhaler Commonly known as:  PROVENTIL HFA;VENTOLIN HFA Inhale 1-2 puffs into the lungs every 6 (six) hours as needed for wheezing or shortness of breath.   BENGAY EX Apply 1 application topically as needed (for pain).   calcium carbonate 500 MG chewable tablet Commonly known as:  TUMS - dosed in mg elemental calcium Chew 2 tablets by mouth as needed for indigestion or heartburn.   diphenhydrAMINE 25 mg capsule  Commonly known as:  BENADRYL Take 50 mg by mouth at bedtime as needed for sleep.   docusate sodium 100 MG capsule Commonly known as:  COLACE Take 200 mg by mouth daily.   fluticasone-salmeterol 115-21 MCG/ACT inhaler Commonly known as:  ADVAIR HFA Inhale 2 puffs into the lungs 2 (two) times daily.   labetalol 100 MG tablet Commonly known as:  NORMODYNE Take 1 tablet (100 mg total) by  mouth 2 (two) times daily.   levETIRAcetam 750 MG tablet Commonly known as:  KEPPRA Take 1 tablet (750 mg total) by mouth 3 (three) times daily.   ORAJEL MT Use as directed in the mouth or throat.   prenatal multivitamin Tabs tablet Take 1 tablet by mouth daily at 12 noon.   pyridOXINE 100 MG tablet Commonly known as:  VITAMIN B-6 Take 200 mg by mouth 2 (two) times daily.   ranitidine 150 MG tablet Commonly known as:  ZANTAC Take 150 mg by mouth 2 (two) times daily.   risperiDONE 2 MG tablet Commonly known as:  RISPERDAL Take 2 mg by mouth 2 (two) times daily.   senna 8.6 MG tablet Commonly known as:  SENOKOT Take 2 tablets by mouth at bedtime.   sertraline 50 MG tablet Commonly known as:  ZOLOFT Take 50 mg by mouth daily.   Vitamin D2 400 units Tabs Take 800 Units by mouth daily.      Julianne Handler, CNM  03/06/2016 9:19 PM

## 2016-03-06 NOTE — ED Triage Notes (Signed)
Patient is [redacted] weeks pregnant. She reports she has been 1 cm for 9 days and she is wanting to be induced. Furthermore, reports being depressed but denies SI. ACT team was suppose to increase Zoloft this week but have not per patient.

## 2016-03-06 NOTE — ED Notes (Signed)
Went to pt's room with handheld doppler per MD to get FHT (which were audible at initial assessment through bell of stethoscope but needed to get actual rate).  Pt stopped me and stated she wanted to go home.  Pt was asked to wait and speak with MD Horton but pt refused any further evaluation & treatment stating she wanted to leave "since you're not going to do anything".  MD Horton aware.  Pt signed out AMA.  Called [facility] and alerted them to pt's departure.

## 2016-03-06 NOTE — MAU Note (Signed)
Is having contractions. Back is really hurting.  Has been one cm.  Denies leaking or bleeding.  Baby is not moving as much

## 2016-03-06 NOTE — ED Notes (Signed)
[facility] called.  They are going to look over her visit at [facility] today and determine if she needs to be seen.  They will call back.

## 2016-03-06 NOTE — MAU Note (Signed)
Urine in lab 

## 2016-03-06 NOTE — Discharge Instructions (Signed)
Introduction Patient Name: ________________________________________________ Patient Due Date: ____________________ What is a fetal movement count? A fetal movement count is the number of times that you feel your baby move during a certain amount of time. This may also be called a fetal kick count. A fetal movement count is recommended for every pregnant woman. You may be asked to start counting fetal movements as early as week 28 of your pregnancy. Pay attention to when your baby is most active. You may notice your baby's sleep and wake cycles. You may also notice things that make your baby move more. You should do a fetal movement count:  When your baby is normally most active.  At the same time each day. A good time to count movements is while you are resting, after having something to eat and drink. How do I count fetal movements? 1. Find a quiet, comfortable area. Sit, or lie down on your side. 2. Write down the date, the start time and stop time, and the number of movements that you felt between those two times. Take this information with you to your health care visits. 3. For 2 hours, count kicks, flutters, swishes, rolls, and jabs. You should feel at least 10 movements during 2 hours. 4. You may stop counting after you have felt 10 movements. 5. If you do not feel 10 movements in 2 hours, have something to eat and drink. Then, keep resting and counting for 1 hour. If you feel at least 4 movements during that hour, you may stop counting. Contact a health care provider if:  You feel fewer than 4 movements in 2 hours.  Your baby is not moving like he or she usually does. Date: ____________ Start time: ____________ Stop time: ____________ Movements: ____________ Date: ____________ Start time: ____________ Stop time: ____________ Movements: ____________ Date: ____________ Start time: ____________ Stop time: ____________ Movements: ____________ Date: ____________ Start time: ____________  Stop time: ____________ Movements: ____________ Date: ____________ Start time: ____________ Stop time: ____________ Movements: ____________ Date: ____________ Start time: ____________ Stop time: ____________ Movements: ____________ Date: ____________ Start time: ____________ Stop time: ____________ Movements: ____________ Date: ____________ Start time: ____________ Stop time: ____________ Movements: ____________ Date: ____________ Start time: ____________ Stop time: ____________ Movements: ____________ This information is not intended to replace advice given to you by your health care provider. Make sure you discuss any questions you have with your health care provider. Document Released: 02/21/2006 Document Revised: 09/21/2015 Document Reviewed: 03/03/2015 Elsevier Interactive Patient Education  2017 [employer]  

## 2016-03-06 NOTE — ED Notes (Signed)
Pt states that she desires an induction even if it requires a C-section.  States that she is tired of her lower back pain and intermittent contractions for over a week now.  Reports being 39 wks and 1 cm dilated.  Denies vaginal bleeding/cramping or changes in fetal activity.  Denies N/V, blurred vision, or CP or SOB.

## 2016-03-06 NOTE — MAU Note (Signed)
At time of discharge, patient upset about leaving and requesting to be induced. CNM came to bedside and explained there is no medical reason for induction and reviewed labor precautions. Discharge instructions reviewed by RN and RN asked patient if she had a safe place to go upton discharge. Patient stated that she did and the "mental health people are coming to pick me up." Discharged in stable condition with significant other and patient walked out to lobby.

## 2016-03-07 ENCOUNTER — Ambulatory Visit (HOSPITAL_COMMUNITY): Admission: RE | Admit: 2016-03-07 | Payer: Medicare Other | Source: Ambulatory Visit

## 2016-03-07 NOTE — Progress Notes (Signed)
[facility] called about patient's arrival to [facility] ED requesting to be induced; ED RN states that patient was seen earlier in the day in MAU to be induced and patient did not have a medical indication for induction so she was discharged; patient was told by ED staff that they are unable to do anything related to her pregnancy care at [facility] and patient left AMA; Dr Ilda Basset called and made aware of patient's visit and AMA status

## 2016-03-08 ENCOUNTER — Inpatient Hospital Stay (HOSPITAL_COMMUNITY)
Admission: AD | Admit: 2016-03-08 | Discharge: 2016-03-08 | Disposition: A | Discharge status: Home / Self Care | Payer: PPO | Source: Ambulatory Visit | Attending: Obstetrics and Gynecology | Admitting: Obstetrics and Gynecology

## 2016-03-08 ENCOUNTER — Encounter (HOSPITAL_COMMUNITY): Payer: Self-pay | Admitting: *Deleted

## 2016-03-08 ENCOUNTER — Ambulatory Visit (INDEPENDENT_AMBULATORY_CARE_PROVIDER_SITE_OTHER): Payer: Medicaid Other | Admitting: Family Medicine

## 2016-03-08 VITALS — BP 116/53 | HR 72 | Wt 206.3 lb

## 2016-03-08 DIAGNOSIS — Z148 Genetic carrier of other disease: Secondary | ICD-10-CM | POA: Diagnosis not present

## 2016-03-08 DIAGNOSIS — G40909 Epilepsy, unspecified, not intractable, without status epilepticus: Secondary | ICD-10-CM

## 2016-03-08 DIAGNOSIS — Z0371 Encounter for suspected problem with amniotic cavity and membrane ruled out: Secondary | ICD-10-CM

## 2016-03-08 DIAGNOSIS — Z87891 Personal history of nicotine dependence: Secondary | ICD-10-CM | POA: Diagnosis not present

## 2016-03-08 DIAGNOSIS — O26899 Other specified pregnancy related conditions, unspecified trimester: Secondary | ICD-10-CM

## 2016-03-08 DIAGNOSIS — O0990 Supervision of high risk pregnancy, unspecified, unspecified trimester: Secondary | ICD-10-CM

## 2016-03-08 DIAGNOSIS — F250 Schizoaffective disorder, bipolar type: Secondary | ICD-10-CM

## 2016-03-08 DIAGNOSIS — Z3A39 39 weeks gestation of pregnancy: Secondary | ICD-10-CM | POA: Diagnosis not present

## 2016-03-08 DIAGNOSIS — Z6791 Unspecified blood type, Rh negative: Secondary | ICD-10-CM | POA: Diagnosis not present

## 2016-03-08 DIAGNOSIS — O4292 Full-term premature rupture of membranes, unspecified as to length of time between rupture and onset of labor: Secondary | ICD-10-CM | POA: Diagnosis not present

## 2016-03-08 DIAGNOSIS — O99350 Diseases of the nervous system complicating pregnancy, unspecified trimester: Principal | ICD-10-CM

## 2016-03-08 DIAGNOSIS — O26893 Other specified pregnancy related conditions, third trimester: Secondary | ICD-10-CM | POA: Insufficient documentation

## 2016-03-08 DIAGNOSIS — O99353 Diseases of the nervous system complicating pregnancy, third trimester: Secondary | ICD-10-CM

## 2016-03-08 LAB — AMNISURE RUPTURE OF MEMBRANE (ROM) NOT AT ARMC: AMNISURE: NEGATIVE

## 2016-03-08 NOTE — MAU Note (Signed)
PT  SAYS  SHE HAD  SEX  AT 530PM.      NOTICED  SPOTTING  AT  6 PM.     THINKS  [facility]  AT 927PM-    WHILE  SHE  WAS LAYING  DOWN -  THEN  TOWELS  WERE  WET .      FEELS  PRESSURE.  WAS AT  CLINIC   TODAY - VE  1  CM.   DENIES HSV AND MRSA.  GBS- NEG

## 2016-03-08 NOTE — MAU Provider Note (Signed)
Obstetric Resident MAU Note  Chief Complaint:  Rupture of Membranes   None    HPI: [name] is a 26 y.o. G2P0010 at [redacted]w[redacted]d who presents to maternity admissions reporting a gushing of fluid around 21:30 this evening. She reports continuing to feel leaking. She is uncertain if the fluid was clear as it was on a blue towel. She had intercourse 4 hrs prior to this episode. She reports having a small amount of vaginal bleeding after intercourse, but this resolved.   Denies contractions, any other vaginal bleeding. Good fetal movement.   Pregnancy Course: Receives care at [facility] Patient Active Problem List   Diagnosis Date Noted  . Tachycardia with heart rate 121-140 beats per minute 01/13/2016  . 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  . Rh negative, antepartum 08/10/2015  . Supervision of high risk pregnancy, antepartum 08/08/2015  . Seizure disorder in pregnancy, antepartum ([place]) 08/08/2015  . Chronic migraine 07/27/2015  . Asthma 04/15/2015  . Schizoaffective disorder, bipolar type by history(HCC) 03/10/2014  . Borderline personality disorder 03/10/2014  . PTSD (post-traumatic stress disorder) 03/10/2014  . Autism spectrum disorder 06/15/2013    Past Medical History:  Diagnosis Date  . Acid reflux   . Asthma   . Autism   . 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 Chronic constipation   . Depression   . Drug-seeking behavior   . Essential tremor   . Personality disorder   . Schizo-affective psychosis ([place])   . Schizoaffective disorder, bipolar type ([place])   . Seizures ([place])    Last seizure December 2017    OB History  Gravida Para Term Preterm AB Living  2       1 0  SAB TAB Ectopic Multiple Live Births  1            # Outcome Date GA Lbr Len/2nd Weight Sex Delivery Anes PTL Lv  2 Current           1 SAB 2010              Past Surgical History:  Procedure Laterality Date  . MOUTH SURGERY      Family History: Family History  Problem Relation Age of Onset  .  Mental illness Father   . Asthma Father   . PDD Brother   . Seizures Brother     Social History: Social History  Substance Use Topics  . Smoking status: Former Smoker    Packs/day: 0.00  . Smokeless tobacco: Never Used     Comment: Smoked for 2  years age [age]  . Alcohol use No    Allergies:  Allergies  Allergen Reactions  . Bee Venom Anaphylaxis  . Coconut Flavor Anaphylaxis  . Geodon [Ziprasidone Hcl] Other (See Comments)    Pt states that this medication causes paralysis of the mouth.    . Haloperidol And Related Other (See Comments)    Pt states that this medication causes paralysis of the mouth.    . Lithium Other (See Comments)    Reaction:  Seizure-like activity.    . Oxycodone Other (See Comments)    Reaction:  Hallucinations   . Seroquel [Quetiapine Fumarate] Other (See Comments)    Pt states that this medication is too strong.   . Shellfish Allergy Anaphylaxis  . Percocet [Oxycodone-Acetaminophen] Other (See Comments)    Reaction:  Hallucinations   . Phenergan [Promethazine Hcl] Other (See Comments)    Reaction:  Chest pain    .  Prilosec [Omeprazole] Nausea And Vomiting  . Tegretol [Carbamazepine] Nausea And Vomiting    Prescriptions Prior to Admission  Medication Sig Dispense Refill Last Dose  . acetaminophen (TYLENOL) 500 MG tablet Take 1,000 mg by mouth every 6 (six) hours as needed for mild pain, moderate pain or headache.   Taking  . albuterol (PROVENTIL HFA;VENTOLIN HFA) 108 (90 Base) MCG/ACT inhaler Inhale 1-2 puffs into the lungs every 6 (six) hours as needed for wheezing or shortness of breath.   Taking  . Benzocaine (ORAJEL MT) Use as directed in the mouth or throat.   Taking  . calcium carbonate (TUMS - DOSED IN MG ELEMENTAL CALCIUM) 500 MG chewable tablet Chew 2 tablets by mouth as needed for indigestion or heartburn.    Taking  . diphenhydrAMINE (BENADRYL) 25 mg capsule Take 50 mg by mouth at bedtime as needed for sleep.    Taking  . docusate  sodium (COLACE) 100 MG capsule Take 200 mg by mouth daily.    Taking  . Ergocalciferol (VITAMIN D2) 400 units TABS Take 800 Units by mouth daily.   Taking  . fluticasone-salmeterol (ADVAIR HFA) 115-21 MCG/ACT inhaler Inhale 2 puffs into the lungs 2 (two) times daily. 1 Inhaler 5 Taking  . labetalol (NORMODYNE) 100 MG tablet Take 1 tablet (100 mg total) by mouth 2 (two) times daily. 60 tablet 2 Taking  . levETIRAcetam (KEPPRA) 750 MG tablet Take 1 tablet (750 mg total) by mouth 3 (three) times daily. 90 tablet 11 Taking  . Menthol, Topical Analgesic, (BENGAY EX) Apply 1 application topically as needed (for pain).    Taking  . Prenatal Vit-Fe Fumarate-FA (PRENATAL MULTIVITAMIN) TABS tablet Take 1 tablet by mouth daily at 12 noon.   Taking  . pyridOXINE (VITAMIN B-6) 100 MG tablet Take 200 mg by mouth 2 (two) times daily.   Taking  . ranitidine (ZANTAC) 150 MG tablet Take 150 mg by mouth 2 (two) times daily.   Taking  . risperiDONE (RISPERDAL) 2 MG tablet Take 2 mg by mouth 2 (two) times daily.   0 Taking  . senna (SENOKOT) 8.6 MG tablet Take 2 tablets by mouth at bedtime.    03/06/2016 at Unknown time  . sertraline (ZOLOFT) 50 MG tablet Take 50 mg by mouth daily.   Taking    ROS: Pertinent findings in history of present illness.  Physical Exam  Blood pressure 121/66, pulse 103, temperature 98.9 F (37.2 C), height 5\' 4"  (1.626 m), weight 204 lb 12 oz (92.9 kg), last menstrual period 06/07/2015. CONSTITUTIONAL: Well-developed, well-nourished female in no acute distress.  HENT:  Normocephalic, atraumatic, Moist mucus membranes EYES: Conjunctivae are normal.  No scleral icterus.  NECK: Normal range of motion, supple, no masses SKIN: Skin is warm and dry. No rash noted. Not diaphoretic. No erythema. No pallor. [place]: Alert and oriented to person, place, and time.No focal defects CARDIOVASCULAR: Normal heart rate noted, regular rhythm RESPIRATORY:No increased work of breathing, stable on room  air, LCTAB ABDOMEN: Soft, nontender, nondistended, gravid appropriate for gestational age MUSCULOSKELETAL: Normal range of motion. No edema and no tenderness. 2+ distal pulses.  SPECULUM EXAM: NEFG, a small amount of clear discharge is present. Physiologic discharge with mucus production, no blood, cervix otherwise without lesions  Dilation: 1.5 Effacement (%): 50 Cervical Position: Anterior Station: -1 Presentation: Vertex Exam by:: Para March  FHT:  Baseline  140s, moderate variability, accelerations present, no decelerations Contractions: irregular pattern   Labs: Results for orders placed or performed during the  hospital encounter of 03/08/16 (from the past 24 hour(s))  Amnisure rupture of membrane (rom)not at [facility]     Status: None   Collection Time: 03/08/16 11:10 PM  Result Value Ref Range   Amnisure ROM NEGATIVE     Imaging:    MAU Course: Patient presented with concern for SROM. Sterile speculum exam was performed and showed a very small amount of clear fluid leaking out, unsure if it is from urethra or vagina. No pooling or liquidy discharge in the vagina at all.  [place] test was performed and negative. Amnisure was obtained and also found to be negative. Patient with reassuring Cat I FHT and irregular contraction pattern with no report of feeling regular contractions.   Assessment: 1. Encounter for suspected premature rupture of amniotic membranes, with rupture of membranes not found   2. 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   3. Rh negative, antepartum     Plan: Discharge home Labor precautions  reviewed Follow up with OB provider  [place] for [place] Follow up.   Specialty:  Obstetrics and Gynecology Why:  as scheduled Contact information: [place] [phone]          Allergies as of 03/08/2016      Reactions   Bee Venom Anaphylaxis   Coconut Flavor Anaphylaxis    Geodon [ziprasidone Hcl] Other (See Comments)   Pt states that this medication causes paralysis of the mouth.     Haloperidol And Related Other (See Comments)   Pt states that this medication causes paralysis of the mouth.     Lithium Other (See Comments)   Reaction:  Seizure-like activity.    Oxycodone Other (See Comments)   Reaction:  Hallucinations    Seroquel [quetiapine Fumarate] Other (See Comments)   Pt states that this medication is too strong.    Shellfish Allergy Anaphylaxis   Percocet [oxycodone-acetaminophen] Other (See Comments)   Reaction:  Hallucinations    Phenergan [promethazine Hcl] Other (See Comments)   Reaction:  Chest pain    Prilosec [omeprazole] Nausea And Vomiting   Tegretol [carbamazepine] Nausea And Vomiting      Medication List    TAKE these medications   acetaminophen 500 MG tablet Commonly known as:  TYLENOL Take 1,000 mg by mouth every 6 (six) hours as needed for mild pain, moderate pain or headache.   albuterol 108 (90 Base) MCG/ACT inhaler Commonly known as:  PROVENTIL HFA;VENTOLIN HFA Inhale 1-2 puffs into the lungs every 6 (six) hours as needed for wheezing or shortness of breath.   BENGAY EX Apply 1 application topically as needed (for pain).   calcium carbonate 500 MG chewable tablet Commonly known as:  TUMS - dosed in mg elemental calcium Chew 2 tablets by mouth as needed for indigestion or heartburn.   diphenhydrAMINE 25 mg capsule Commonly known as:  BENADRYL Take 50 mg by mouth at bedtime as needed for sleep.   docusate sodium 100 MG capsule Commonly known as:  COLACE Take 200 mg by mouth daily.   fluticasone-salmeterol 115-21 MCG/ACT inhaler Commonly known as:  ADVAIR HFA Inhale 2 puffs into the lungs 2 (two) times daily.   labetalol 100 MG tablet Commonly known as:  NORMODYNE Take 1 tablet (100 mg total) by mouth 2 (two) times daily.   levETIRAcetam 750 MG tablet Commonly known as:  KEPPRA Take 1 tablet (750 mg total)  by mouth 3 (three) times daily.   ORAJEL MT  Use as directed in the mouth or throat.   prenatal multivitamin Tabs tablet Take 1 tablet by mouth daily at 12 noon.   pyridOXINE 100 MG tablet Commonly known as:  VITAMIN B-6 Take 200 mg by mouth 2 (two) times daily.   ranitidine 150 MG tablet Commonly known as:  ZANTAC Take 150 mg by mouth 2 (two) times daily.   risperiDONE 2 MG tablet Commonly known as:  RISPERDAL Take 2 mg by mouth 2 (two) times daily.   senna 8.6 MG tablet Commonly known as:  SENOKOT Take 2 tablets by mouth at bedtime.   sertraline 50 MG tablet Commonly known as:  ZOLOFT Take 50 mg by mouth daily.   Vitamin D2 400 units Tabs Take 800 Units by mouth daily.       Loann Quill, MD PGY-2 03/08/2016 11:38 PM   I have seen and examined this patient and agree with the management plan.

## 2016-03-08 NOTE — Discharge Instructions (Signed)
Braxton Hicks Contractions °Contractions of the uterus can occur throughout pregnancy. Contractions are not always a sign that you are in labor.  °WHAT ARE BRAXTON HICKS CONTRACTIONS?  °Contractions that occur before labor are called Braxton Hicks contractions, or false labor. Toward the end of pregnancy (32-34 weeks), these contractions can develop more often and may become more forceful. This is not true labor because these contractions do not result in opening (dilatation) and thinning of the cervix. They are sometimes difficult to tell apart from true labor because these contractions can be forceful and people have different pain tolerances. You should not feel embarrassed if you go to the hospital with false labor. Sometimes, the only way to tell if you are in true labor is for your health care provider to look for changes in the cervix. °If there are no prenatal problems or other health problems associated with the pregnancy, it is completely safe to be sent home with false labor and await the onset of true labor. °HOW CAN YOU TELL THE DIFFERENCE BETWEEN TRUE AND FALSE LABOR? °False Labor  °· The contractions of false labor are usually shorter and not as hard as those of true labor.   °· The contractions are usually irregular.   °· The contractions are often felt in the front of the lower abdomen and in the groin.   °· The contractions may go away when you walk around or change positions while lying down.   °· The contractions get weaker and are shorter lasting as time goes on.   °· The contractions do not usually become progressively stronger, regular, and closer together as with true labor.   °True Labor  °· Contractions in true labor last 30-70 seconds, become very regular, usually become more intense, and increase in frequency.   °· The contractions do not go away with walking.   °· The discomfort is usually felt in the top of the uterus and spreads to the lower abdomen and low back.   °· True labor can be  determined by your health care provider with an exam. This will show that the cervix is dilating and getting thinner.   °WHAT TO REMEMBER °· Keep up with your usual exercises and follow other instructions given by your health care provider.   °· Take medicines as directed by your health care provider.   °· Keep your regular prenatal appointments.   °· Eat and drink lightly if you think you are going into labor.   °· If Braxton Hicks contractions are making you uncomfortable:   °¨ Change your position from lying down or resting to walking, or from walking to resting.   °¨ Sit and rest in a tub of warm water.   °¨ Drink 2-3 glasses of water. Dehydration may cause these contractions.   °¨ Do slow and deep breathing several times an hour.   °WHEN SHOULD I SEEK IMMEDIATE MEDICAL CARE? °Seek immediate medical care if: °· Your contractions become stronger, more regular, and closer together.   °· You have fluid leaking or gushing from your vagina.   °· You have a fever.   °· You pass blood-tinged mucus.   °· You have vaginal bleeding.   °· You have continuous abdominal pain.   °· You have low back pain that you never had before.   °· You feel your baby's head pushing down and causing pelvic pressure.   °· Your baby is not moving as much as it used to.   °This information is not intended to replace advice given to you by your health care provider. Make sure you discuss any questions you have with your health care   provider. °Document Released: 01/22/2005 Document Revised: 05/16/2015 Document Reviewed: 11/03/2012 °Elsevier Interactive Patient Education © 2017 Elsevier Inc. ° °

## 2016-03-08 NOTE — Progress Notes (Signed)
   PRENATAL VISIT NOTE  Subjective:  [name] is a 26 y.o. G2P0010 at [redacted]w[redacted]d being seen today for ongoing prenatal care.  She is currently monitored for the following issues for this high-risk pregnancy and has Autism spectrum disorder; Schizoaffective disorder, bipolar type by history(HCC); Borderline personality disorder; PTSD (post-traumatic stress disorder); Asthma; Chronic migraine; Supervision of high risk pregnancy, antepartum; Seizure disorder in pregnancy, antepartum ([place]); Rh negative, antepartum; 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; and Tachycardia with heart rate 121-140 beats per minute on her problem list.  Patient reports no complaints.  Contractions: Irritability. Vag. Bleeding: None.  Movement: Present. Denies leaking of fluid.   The following portions of the patient's history were reviewed and updated as appropriate: allergies, current medications, past family history, past medical history, past social history, past surgical history and problem list. Problem list updated.  Objective:   Vitals:   03/08/16 1532  BP: (!) 116/53  Pulse: 72  Weight: 206 lb 4.8 oz (93.6 kg)    Fetal Status: Fetal Heart Rate (bpm): 150 Fundal Height: 36 cm Movement: Present  Presentation: Vertex  General:  Alert, oriented and cooperative. Patient is in no acute distress.  Skin: Skin is warm and dry. No rash noted.   Cardiovascular: Normal heart rate noted  Respiratory: Normal respiratory effort, no problems with respiration noted  Abdomen: Soft, gravid, appropriate for gestational age. Pain/Pressure: Present     Pelvic:  Cervical exam performed Dilation: 1.5 Effacement (%): 70    Extremities: Normal range of motion.     Mental Status: Normal mood and affect. Normal behavior. Normal judgment and thought content.   Assessment and Plan:  Pregnancy: G2P0010 at [redacted]w[redacted]d  1. Seizure disorder in pregnancy, antepartum ([place]) Continue Keppra  2. Schizoaffective disorder, bipolar type by  history(HCC) Continue Risperdal, zoloft  3. Supervision of high risk pregnancy, antepartum Labor precautions Begin 2x/wk testing next visit IOL scheduled 2/13  Term labor symptoms and general obstetric precautions including but not limited to vaginal bleeding, contractions, leaking of fluid and fetal movement were reviewed in detail with the patient. Please refer to After Visit Summary for other counseling recommendations.  Return in 1 week (on 03/15/2016) for OB visit and NST.   Donnamae Jude, MD

## 2016-03-08 NOTE — Patient Instructions (Signed)
Third Trimester of Pregnancy The third trimester is from week 29 through week 40 (months 7 through 9). The third trimester is a time when the unborn baby (fetus) is growing rapidly. At the end of the ninth month, the fetus is about 20 inches in length and weighs 6-10 pounds. Body changes during your third trimester Your body goes through many changes during pregnancy. The changes vary from woman to woman. During the third trimester:  Your weight will continue to increase. You can expect to gain 25-35 pounds (11-16 kg) by the end of the pregnancy.  You may begin to get stretch marks on your hips, abdomen, and breasts.  You may urinate more often because the fetus is moving lower into your pelvis and pressing on your bladder.  You may develop or continue to have heartburn. This is caused by increased hormones that slow down muscles in the digestive tract.  You may develop or continue to have constipation because increased hormones slow digestion and cause the muscles that push waste through your intestines to relax.  You may develop hemorrhoids. These are swollen veins (varicose veins) in the rectum that can itch or be painful.  You may develop swollen, bulging veins (varicose veins) in your legs.  You may have increased body aches in the pelvis, back, or thighs. This is due to weight gain and increased hormones that are relaxing your joints.  You may have changes in your hair. These can include thickening of your hair, rapid growth, and changes in texture. Some women also have hair loss during or after pregnancy, or hair that feels dry or thin. Your hair will most likely return to normal after your baby is born.  Your breasts will continue to grow and they will continue to become tender. A yellow fluid (colostrum) may leak from your breasts. This is the first milk you are producing for your baby.  Your belly button may stick out.  You may notice more swelling in your hands, face, or  ankles.  You may have increased tingling or numbness in your hands, arms, and legs. The skin on your belly may also feel numb.  You may feel short of breath because of your expanding uterus.  You may have more problems sleeping. This can be caused by the size of your belly, increased need to urinate, and an increase in your body's metabolism.  You may notice the fetus "dropping," or moving lower in your abdomen.  You may have increased vaginal discharge.  Your cervix becomes thin and soft (effaced) near your due date. What to expect at prenatal visits You will have prenatal exams every 2 weeks until week 36. Then you will have weekly prenatal exams. During a routine prenatal visit:  You will be weighed to make sure you and the fetus are growing normally.  Your blood pressure will be taken.  Your abdomen will be measured to track your baby's growth.  The fetal heartbeat will be listened to.  Any test results from the previous visit will be discussed.  You may have a cervical check near your due date to see if you have effaced. At around 36 weeks, your health care provider will check your cervix. At the same time, your health care provider will also perform a test on the secretions of the vaginal tissue. This test is to determine if a type of bacteria, Group B streptococcus, is present. Your health care provider will explain this further. Your health care provider may ask you:    What your birth plan is.  How you are feeling.  If you are feeling the baby move.  If you have had any abnormal symptoms, such as leaking fluid, bleeding, severe headaches, or abdominal cramping.  If you are using any tobacco products, including cigarettes, chewing tobacco, and electronic cigarettes.  If you have any questions. Other tests or screenings that may be performed during your third trimester include:  Blood tests that check for low iron levels (anemia).  Fetal testing to check the health,  activity level, and growth of the fetus. Testing is done if you have certain medical conditions or if there are problems during the pregnancy.  Nonstress test (NST). This test checks the health of your baby to make sure there are no signs of problems, such as the baby not getting enough oxygen. During this test, a belt is placed around your belly. The baby is made to move, and its heart rate is monitored during movement. What is false labor? False labor is a condition in which you feel small, irregular tightenings of the muscles in the womb (contractions) that eventually go away. These are called Braxton Hicks contractions. Contractions may last for hours, days, or even weeks before true labor sets in. If contractions come at regular intervals, become more frequent, increase in intensity, or become painful, you should see your health care provider. What are the signs of labor?  Abdominal cramps.  Regular contractions that start at 10 minutes apart and become stronger and more frequent with time.  Contractions that start on the top of the uterus and spread down to the lower abdomen and back.  Increased pelvic pressure and dull back pain.  A watery or bloody mucus discharge that comes from the vagina.  Leaking of amniotic fluid. This is also known as your "water breaking." It could be a slow trickle or a gush. Let your doctor know if it has a color or strange odor. If you have any of these signs, call your health care provider right away, even if it is before your due date. Follow these instructions at home: Eating and drinking  Continue to eat regular, healthy meals.  Do not eat:  Raw meat or meat spreads.  Unpasteurized milk or cheese.  Unpasteurized juice.  Store-made salad.  Refrigerated smoked seafood.  Hot dogs or deli meat, unless they are piping hot.  More than 6 ounces of albacore tuna a week.  Shark, swordfish, king mackerel, or tile fish.  Store-made salads.  Raw  sprouts, such as mung bean or alfalfa sprouts.  Take prenatal vitamins as told by your health care provider.  Take 1000 mg of calcium daily as told by your health care provider.  If you develop constipation:  Take over-the-counter or prescription medicines.  Drink enough fluid to keep your urine clear or pale yellow.  Eat foods that are high in fiber, such as fresh fruits and vegetables, whole grains, and beans.  Limit foods that are high in fat and processed sugars, such as fried and sweet foods. Activity  Exercise only as directed by your health care provider. Healthy pregnant women should aim for 2 hours and 30 minutes of moderate exercise per week. If you experience any pain or discomfort while exercising, stop.  Avoid heavy lifting.  Do not exercise in extreme heat or humidity, or at high altitudes.  Wear low-heel, comfortable shoes.  Practice good posture.  Do not travel far distances unless it is absolutely necessary and only with the approval   of your health care provider.  Wear your seat belt at all times while in a car, on a bus, or on a plane.  Take frequent breaks and rest with your legs elevated if you have leg cramps or low back pain.  Do not use hot tubs, steam rooms, or saunas.  You may continue to have sex unless your health care provider tells you otherwise. Lifestyle  Do not use any products that contain nicotine or tobacco, such as cigarettes and e-cigarettes. If you need help quitting, ask your health care provider.  Do not drink alcohol.  Do not use any medicinal herbs or unprescribed drugs. These chemicals affect the formation and growth of the baby.  If you develop varicose veins:  Wear support pantyhose or compression stockings as told by your healthcare provider.  Elevate your feet for 15 minutes, 3-4 times a day.  Wear a supportive maternity bra to help with breast tenderness. General instructions  Take over-the-counter and prescription  medicines only as told by your health care provider. There are medicines that are either safe or unsafe to take during pregnancy.  Take warm sitz baths to soothe any pain or discomfort caused by hemorrhoids. Use hemorrhoid cream or witch hazel if your health care provider approves.  Avoid cat litter boxes and soil used by cats. These carry germs that can cause birth defects in the baby. If you have a cat, ask someone to clean the litter box for you.  To prepare for the arrival of your baby:  Take prenatal classes to understand, practice, and ask questions about the labor and delivery.  Make a trial run to the hospital.  Visit the hospital and tour the maternity area.  Arrange for maternity or paternity leave through employers.  Arrange for family and friends to take care of pets while you are in the hospital.  Purchase a rear-facing car seat and make sure you know how to install it in your car.  Pack your hospital bag.  Prepare the baby's nursery. Make sure to remove all pillows and stuffed animals from the baby's crib to prevent suffocation.  Visit your dentist if you have not gone during your pregnancy. Use a soft toothbrush to brush your teeth and be gentle when you floss.  Keep all prenatal follow-up visits as told by your health care provider. This is important. Contact a health care provider if:  You are unsure if you are in labor or if your water has broken.  You become dizzy.  You have mild pelvic cramps, pelvic pressure, or nagging pain in your abdominal area.  You have lower back pain.  You have persistent nausea, vomiting, or diarrhea.  You have an unusual or bad smelling vaginal discharge.  You have pain when you urinate. Get help right away if:  You have a fever.  You are leaking fluid from your vagina.  You have spotting or bleeding from your vagina.  You have severe abdominal pain or cramping.  You have rapid weight loss or weight gain.  You have  shortness of breath with chest pain.  You notice sudden or extreme swelling of your face, hands, ankles, feet, or legs.  Your baby makes fewer than 10 movements in 2 hours.  You have severe headaches that do not go away with medicine.  You have vision changes. Summary  The third trimester is from week 29 through week 40, months 7 through 9. The third trimester is a time when the unborn baby (fetus)   is growing rapidly.  During the third trimester, your discomfort may increase as you and your baby continue to gain weight. You may have abdominal, leg, and back pain, sleeping problems, and an increased need to urinate.  During the third trimester your breasts will keep growing and they will continue to become tender. A yellow fluid (colostrum) may leak from your breasts. This is the first milk you are producing for your baby.  False labor is a condition in which you feel small, irregular tightenings of the muscles in the womb (contractions) that eventually go away. These are called Braxton Hicks contractions. Contractions may last for hours, days, or even weeks before true labor sets in.  Signs of labor can include: abdominal cramps; regular contractions that start at 10 minutes apart and become stronger and more frequent with time; watery or bloody mucus discharge that comes from the vagina; increased pelvic pressure and dull back pain; and leaking of amniotic fluid. This information is not intended to replace advice given to you by your health care provider. Make sure you discuss any questions you have with your health care provider. Document Released: 01/16/2001 Document Revised: 06/30/2015 Document Reviewed: 03/25/2012 [employer] Interactive Patient Education  2017 [employer]   Breastfeeding Deciding to breastfeed is one of the best choices you can make for you and your baby. A change in hormones during pregnancy causes your breast tissue to grow and increases the number and size of your  milk ducts. These hormones also allow proteins, sugars, and fats from your blood supply to make breast milk in your milk-producing glands. Hormones prevent breast milk from being released before your baby is born as well as prompt milk flow after birth. Once breastfeeding has begun, thoughts of your baby, as well as his or her sucking or crying, can stimulate the release of milk from your milk-producing glands. Benefits of breastfeeding For Your Baby  Your first milk (colostrum) helps your baby's digestive system function better.  There are antibodies in your milk that help your baby fight off infections.  Your baby has a lower incidence of asthma, allergies, and sudden infant death syndrome.  The nutrients in breast milk are better for your baby than infant formulas and are designed uniquely for your baby's needs.  Breast milk improves your baby's brain development.  Your baby is less likely to develop other conditions, such as childhood obesity, asthma, or type 2 diabetes mellitus. For You  Breastfeeding helps to create a very special bond between you and your baby.  Breastfeeding is convenient. Breast milk is always available at the correct temperature and costs nothing.  Breastfeeding helps to burn calories and helps you lose the weight gained during pregnancy.  Breastfeeding makes your uterus contract to its prepregnancy size faster and slows bleeding (lochia) after you give birth.  Breastfeeding helps to lower your risk of developing type 2 diabetes mellitus, osteoporosis, and breast or ovarian cancer later in life. Signs that your baby is hungry Early Signs of Hunger  Increased alertness or activity.  Stretching.  Movement of the head from side to side.  Movement of the head and opening of the mouth when the corner of the mouth or cheek is stroked (rooting).  Increased sucking sounds, smacking lips, cooing, sighing, or squeaking.  Hand-to-mouth movements.  Increased  sucking of fingers or hands. Late Signs of Hunger  Fussing.  Intermittent crying. Extreme Signs of Hunger  Signs of extreme hunger will require calming and consoling before your baby will   be able to breastfeed successfully. Do not wait for the following signs of extreme hunger to occur before you initiate breastfeeding:  Restlessness.  A loud, strong cry.  Screaming. Breastfeeding basics  Breastfeeding Initiation  Find a comfortable place to sit or lie down, with your neck and back well supported.  Place a pillow or rolled up blanket under your baby to bring him or her to the level of your breast (if you are seated). Nursing pillows are specially designed to help support your arms and your baby while you breastfeed.  Make sure that your baby's abdomen is facing your abdomen.  Gently massage your breast. With your fingertips, massage from your chest wall toward your nipple in a circular motion. This encourages milk flow. You may need to continue this action during the feeding if your milk flows slowly.  Support your breast with 4 fingers underneath and your thumb above your nipple. Make sure your fingers are well away from your nipple and your baby's mouth.  Stroke your baby's lips gently with your finger or nipple.  When your baby's mouth is open wide enough, quickly bring your baby to your breast, placing your entire nipple and as much of the colored area around your nipple (areola) as possible into your baby's mouth.  More areola should be visible above your baby's upper lip than below the lower lip.  Your baby's tongue should be between his or her lower gum and your breast.  Ensure that your baby's mouth is correctly positioned around your nipple (latched). Your baby's lips should create a seal on your breast and be turned out (everted).  It is common for your baby to suck about 2-3 minutes in order to start the flow of breast milk. Latching  Teaching your baby how to latch  on to your breast properly is very important. An improper latch can cause nipple pain and decreased milk supply for you and poor weight gain in your baby. Also, if your baby is not latched onto your nipple properly, he or she may swallow some air during feeding. This can make your baby fussy. Burping your baby when you switch breasts during the feeding can help to get rid of the air. However, teaching your baby to latch on properly is still the best way to prevent fussiness from swallowing air while breastfeeding. Signs that your baby has successfully latched on to your nipple:  Silent tugging or silent sucking, without causing you pain.  Swallowing heard between every 3-4 sucks.  Muscle movement above and in front of his or her ears while sucking. Signs that your baby has not successfully latched on to nipple:  Sucking sounds or smacking sounds from your baby while breastfeeding.  Nipple pain. If you think your baby has not latched on correctly, slip your finger into the corner of your baby's mouth to break the suction and place it between your baby's gums. Attempt breastfeeding initiation again. Signs of Successful Breastfeeding  Signs from your baby:  A gradual decrease in the number of sucks or complete cessation of sucking.  Falling asleep.  Relaxation of his or her body.  Retention of a small amount of milk in his or her mouth.  Letting go of your breast by himself or herself. Signs from you:  Breasts that have increased in firmness, weight, and size 1-3 hours after feeding.  Breasts that are softer immediately after breastfeeding.  Increased milk volume, as well as a change in milk consistency and color by   the fifth day of breastfeeding.  Nipples that are not sore, cracked, or bleeding. Signs That Your Baby is Getting Enough Milk  Wetting at least 1-2 diapers during the first 24 hours after birth.  Wetting at least 5-6 diapers every 24 hours for the first week after  birth. The urine should be clear or pale yellow by 5 days after birth.  Wetting 6-8 diapers every 24 hours as your baby continues to grow and develop.  At least 3 stools in a 24-hour period by age 5 days. The stool should be soft and yellow.  At least 3 stools in a 24-hour period by age 7 days. The stool should be seedy and yellow.  No loss of weight greater than 10% of birth weight during the first 3 days of age.  Average weight gain of 4-7 ounces (113-198 g) per week after age 4 days.  Consistent daily weight gain by age 5 days, without weight loss after the age of 2 weeks. After a feeding, your baby may spit up a small amount. This is common. Breastfeeding frequency and duration Frequent feeding will help you make more milk and can prevent sore nipples and breast engorgement. Breastfeed when you feel the need to reduce the fullness of your breasts or when your baby shows signs of hunger. This is called "breastfeeding on demand." Avoid introducing a pacifier to your baby while you are working to establish breastfeeding (the first 4-6 weeks after your baby is born). After this time you may choose to use a pacifier. Research has shown that pacifier use during the first year of a baby's life decreases the risk of sudden infant death syndrome (SIDS). Allow your baby to feed on each breast as long as he or she wants. Breastfeed until your baby is finished feeding. When your baby unlatches or falls asleep while feeding from the first breast, offer the second breast. Because newborns are often sleepy in the first few weeks of life, you may need to awaken your baby to get him or her to feed. Breastfeeding times will vary from baby to baby. However, the following rules can serve as a guide to help you ensure that your baby is properly fed:  Newborns (babies 4 weeks of age or younger) may breastfeed every 1-3 hours.  Newborns should not go longer than 3 hours during the day or 5 hours during the night  without breastfeeding.  You should breastfeed your baby a minimum of 8 times in a 24-hour period until you begin to introduce solid foods to your baby at around 6 months of age. Breast milk pumping Pumping and storing breast milk allows you to ensure that your baby is exclusively fed your breast milk, even at times when you are unable to breastfeed. This is especially important if you are going back to work while you are still breastfeeding or when you are not able to be present during feedings. Your lactation consultant can give you guidelines on how long it is safe to store breast milk. A breast pump is a machine that allows you to pump milk from your breast into a sterile bottle. The pumped breast milk can then be stored in a refrigerator or freezer. Some breast pumps are operated by hand, while others use electricity. Ask your lactation consultant which type will work best for you. Breast pumps can be purchased, but some hospitals and breastfeeding support groups lease breast pumps on a monthly basis. A lactation consultant can teach you how to   hand express breast milk, if you prefer not to use a pump. Caring for your breasts while you breastfeed Nipples can become dry, cracked, and sore while breastfeeding. The following recommendations can help keep your breasts moisturized and healthy:  Avoid using soap on your nipples.  Wear a supportive bra. Although not required, special nursing bras and tank tops are designed to allow access to your breasts for breastfeeding without taking off your entire bra or top. Avoid wearing underwire-style bras or extremely tight bras.  Air dry your nipples for 3-4minutes after each feeding.  Use only cotton bra pads to absorb leaked breast milk. Leaking of breast milk between feedings is normal.  Use lanolin on your nipples after breastfeeding. Lanolin helps to maintain your skin's normal moisture barrier. If you use pure lanolin, you do not need to wash it off  before feeding your baby again. Pure lanolin is not toxic to your baby. You may also hand express a few drops of breast milk and gently massage that milk into your nipples and allow the milk to air dry. In the first few weeks after giving birth, some women experience extremely full breasts (engorgement). Engorgement can make your breasts feel heavy, warm, and tender to the touch. Engorgement peaks within 3-5 days after you give birth. The following recommendations can help ease engorgement:  Completely empty your breasts while breastfeeding or pumping. You may want to start by applying warm, moist heat (in the shower or with warm water-soaked hand towels) just before feeding or pumping. This increases circulation and helps the milk flow. If your baby does not completely empty your breasts while breastfeeding, pump any extra milk after he or she is finished.  Wear a snug bra (nursing or regular) or tank top for 1-2 days to signal your body to slightly decrease milk production.  Apply ice packs to your breasts, unless this is too uncomfortable for you.  Make sure that your baby is latched on and positioned properly while breastfeeding. If engorgement persists after 48 hours of following these recommendations, contact your health care provider or a lactation consultant. Overall health care recommendations while breastfeeding  Eat healthy foods. Alternate between meals and snacks, eating 3 of each per day. Because what you eat affects your breast milk, some of the foods may make your baby more irritable than usual. Avoid eating these foods if you are sure that they are negatively affecting your baby.  Drink milk, fruit juice, and water to satisfy your thirst (about 10 glasses a day).  Rest often, relax, and continue to take your prenatal vitamins to prevent fatigue, stress, and anemia.  Continue breast self-awareness checks.  Avoid chewing and smoking tobacco. Chemicals from cigarettes that pass  into breast milk and exposure to secondhand smoke may harm your baby.  Avoid alcohol and drug use, including marijuana. Some medicines that may be harmful to your baby can pass through breast milk. It is important to ask your health care provider before taking any medicine, including all over-the-counter and prescription medicine as well as vitamin and herbal supplements. It is possible to become pregnant while breastfeeding. If birth control is desired, ask your health care provider about options that will be safe for your baby. Contact a health care provider if:  You feel like you want to stop breastfeeding or have become frustrated with breastfeeding.  You have painful breasts or nipples.  Your nipples are cracked or bleeding.  Your breasts are red, tender, or warm.  You have   a swollen area on either breast.  You have a fever or chills.  You have nausea or vomiting.  You have drainage other than breast milk from your nipples.  Your breasts do not become full before feedings by the fifth day after you give birth.  You feel sad and depressed.  Your baby is too sleepy to eat well.  Your baby is having trouble sleeping.  Your baby is wetting less than 3 diapers in a 24-hour period.  Your baby has less than 3 stools in a 24-hour period.  Your baby's skin or the white part of his or her eyes becomes yellow.  Your baby is not gaining weight by 5 days of age. Get help right away if:  Your baby is overly tired (lethargic) and does not want to wake up and feed.  Your baby develops an unexplained fever. This information is not intended to replace advice given to you by your health care provider. Make sure you discuss any questions you have with your health care provider. Document Released: 01/22/2005 Document Revised: 07/06/2015 Document Reviewed: 07/16/2012 Elsevier Interactive Patient Education  2017 Elsevier Inc.  

## 2016-03-09 ENCOUNTER — Telehealth (HOSPITAL_COMMUNITY): Payer: Self-pay | Admitting: *Deleted

## 2016-03-09 NOTE — Telephone Encounter (Signed)
Preadmission screen  

## 2016-03-10 ENCOUNTER — Inpatient Hospital Stay (HOSPITAL_COMMUNITY)
Admission: AD | Admit: 2016-03-10 | Discharge: 2016-03-11 | Disposition: A | Discharge status: Home / Self Care | Payer: PPO | Source: Ambulatory Visit | Attending: Obstetrics & Gynecology | Admitting: Obstetrics & Gynecology

## 2016-03-10 ENCOUNTER — Encounter (HOSPITAL_COMMUNITY): Payer: Self-pay | Admitting: *Deleted

## 2016-03-10 DIAGNOSIS — Z148 Genetic carrier of other disease: Secondary | ICD-10-CM

## 2016-03-10 DIAGNOSIS — Z6791 Unspecified blood type, Rh negative: Secondary | ICD-10-CM

## 2016-03-10 DIAGNOSIS — O26899 Other specified pregnancy related conditions, unspecified trimester: Secondary | ICD-10-CM

## 2016-03-10 DIAGNOSIS — Z3A39 39 weeks gestation of pregnancy: Secondary | ICD-10-CM | POA: Insufficient documentation

## 2016-03-10 DIAGNOSIS — O471 False labor at or after 37 completed weeks of gestation: Secondary | ICD-10-CM | POA: Insufficient documentation

## 2016-03-10 NOTE — MAU Note (Signed)
Contractions for 2wks. Worse since 2000. Baby not moving 5x/hr . Denies LOF or bleeding

## 2016-03-11 DIAGNOSIS — Z3A39 39 weeks gestation of pregnancy: Secondary | ICD-10-CM | POA: Diagnosis not present

## 2016-03-11 DIAGNOSIS — O471 False labor at or after 37 completed weeks of gestation: Secondary | ICD-10-CM | POA: Diagnosis not present

## 2016-03-12 ENCOUNTER — Telehealth (HOSPITAL_COMMUNITY): Payer: Self-pay | Admitting: *Deleted

## 2016-03-12 ENCOUNTER — Encounter (HOSPITAL_COMMUNITY): Payer: Self-pay | Admitting: *Deleted

## 2016-03-12 NOTE — Telephone Encounter (Signed)
Preadmission screen  

## 2016-03-14 ENCOUNTER — Encounter: Payer: Self-pay | Admitting: Obstetrics and Gynecology

## 2016-03-14 ENCOUNTER — Inpatient Hospital Stay (HOSPITAL_COMMUNITY)
Admission: AD | Admit: 2016-03-14 | Discharge: 2016-03-14 | Disposition: A | Discharge status: Home / Self Care | Payer: PPO | Source: Ambulatory Visit | Attending: Obstetrics & Gynecology | Admitting: Obstetrics & Gynecology

## 2016-03-14 ENCOUNTER — Encounter (HOSPITAL_COMMUNITY): Payer: Self-pay | Admitting: *Deleted

## 2016-03-14 ENCOUNTER — Ambulatory Visit (INDEPENDENT_AMBULATORY_CARE_PROVIDER_SITE_OTHER): Payer: PPO | Admitting: Obstetrics and Gynecology

## 2016-03-14 VITALS — BP 112/70 | HR 103 | Wt 207.4 lb

## 2016-03-14 DIAGNOSIS — Z148 Genetic carrier of other disease: Secondary | ICD-10-CM

## 2016-03-14 DIAGNOSIS — Z3493 Encounter for supervision of normal pregnancy, unspecified, third trimester: Secondary | ICD-10-CM | POA: Diagnosis not present

## 2016-03-14 DIAGNOSIS — O99344 Other mental disorders complicating childbirth: Secondary | ICD-10-CM | POA: Diagnosis not present

## 2016-03-14 DIAGNOSIS — O99353 Diseases of the nervous system complicating pregnancy, third trimester: Secondary | ICD-10-CM

## 2016-03-14 DIAGNOSIS — E669 Obesity, unspecified: Secondary | ICD-10-CM | POA: Diagnosis not present

## 2016-03-14 DIAGNOSIS — O0990 Supervision of high risk pregnancy, unspecified, unspecified trimester: Secondary | ICD-10-CM

## 2016-03-14 DIAGNOSIS — O26899 Other specified pregnancy related conditions, unspecified trimester: Secondary | ICD-10-CM

## 2016-03-14 DIAGNOSIS — Z87891 Personal history of nicotine dependence: Secondary | ICD-10-CM | POA: Diagnosis not present

## 2016-03-14 DIAGNOSIS — O9962 Diseases of the digestive system complicating childbirth: Secondary | ICD-10-CM | POA: Diagnosis not present

## 2016-03-14 DIAGNOSIS — K219 Gastro-esophageal reflux disease without esophagitis: Secondary | ICD-10-CM | POA: Diagnosis not present

## 2016-03-14 DIAGNOSIS — G40909 Epilepsy, unspecified, not intractable, without status epilepticus: Secondary | ICD-10-CM

## 2016-03-14 DIAGNOSIS — Z6791 Unspecified blood type, Rh negative: Secondary | ICD-10-CM

## 2016-03-14 DIAGNOSIS — Z6834 Body mass index (BMI) 34.0-34.9, adult: Secondary | ICD-10-CM | POA: Diagnosis not present

## 2016-03-14 DIAGNOSIS — O360930 Maternal care for other rhesus isoimmunization, third trimester, not applicable or unspecified: Secondary | ICD-10-CM

## 2016-03-14 DIAGNOSIS — O480 Post-term pregnancy: Secondary | ICD-10-CM | POA: Diagnosis not present

## 2016-03-14 DIAGNOSIS — Z3A40 40 weeks gestation of pregnancy: Secondary | ICD-10-CM | POA: Diagnosis not present

## 2016-03-14 DIAGNOSIS — O9952 Diseases of the respiratory system complicating childbirth: Secondary | ICD-10-CM | POA: Diagnosis not present

## 2016-03-14 DIAGNOSIS — O99350 Diseases of the nervous system complicating pregnancy, unspecified trimester: Secondary | ICD-10-CM

## 2016-03-14 DIAGNOSIS — F250 Schizoaffective disorder, bipolar type: Secondary | ICD-10-CM | POA: Diagnosis not present

## 2016-03-14 DIAGNOSIS — J45909 Unspecified asthma, uncomplicated: Secondary | ICD-10-CM | POA: Diagnosis not present

## 2016-03-14 DIAGNOSIS — O99214 Obesity complicating childbirth: Secondary | ICD-10-CM | POA: Diagnosis not present

## 2016-03-14 NOTE — Progress Notes (Signed)
   PRENATAL VISIT NOTE  Subjective:  [name] is a 26 y.o. G2P0010 at [redacted]w[redacted]d being seen today for ongoing prenatal care.  She is currently monitored for the following issues for this high-risk pregnancy and has Autism spectrum disorder; Schizoaffective disorder, bipolar type by history(HCC); Borderline personality disorder; PTSD (post-traumatic stress disorder); Asthma; Chronic migraine; Supervision of high risk pregnancy, antepartum; Seizure disorder in pregnancy, antepartum ([place]); Rh negative, antepartum; 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; and Tachycardia with heart rate 121-140 beats per minute on her problem list.  Patient reports no complaints.  Contractions: Irregular. Vag. Bleeding: None.  Movement: Present. Denies leaking of fluid.   The following portions of the patient's history were reviewed and updated as appropriate: allergies, current medications, past family history, past medical history, past social history, past surgical history and problem list. Problem list updated.  Objective:   Vitals:   03/14/16 1437  BP: 112/70  Pulse: (!) 103  Weight: 207 lb 6.4 oz (94.1 kg)    Fetal Status: Fetal Heart Rate (bpm): NST   Movement: Present     General:  Alert, oriented and cooperative. Patient is in no acute distress.  Skin: Skin is warm and dry. No rash noted.   Cardiovascular: Normal heart rate noted  Respiratory: Normal respiratory effort, no problems with respiration noted  Abdomen: Soft, gravid, appropriate for gestational age. Pain/Pressure: Present     Pelvic:  Cervical exam deferred        Extremities: Normal range of motion.     Mental Status: Normal mood and affect. Normal behavior. Normal judgment and thought content.   Assessment and Plan:  Pregnancy: G2P0010 at [redacted]w[redacted]d  1. Post term pregnancy, antepartum condition or complication NST reviewed and reactive  - Fetal nonstress test  2. Supervision of high risk pregnancy, antepartum Patient is doing well without  complaints IOL on 2/13  3. Seizure disorder in pregnancy, antepartum ([place]) Continue Keppra  4. Rh negative, antepartum S/p rhogam  Term labor symptoms and general obstetric precautions including but not limited to vaginal bleeding, contractions, leaking of fluid and fetal movement were reviewed in detail with the patient. Please refer to After Visit Summary for other counseling recommendations.  Return in about 2 days (around 03/16/2016) for NST/AFI .   Mora Bellman, MD

## 2016-03-14 NOTE — Discharge Instructions (Signed)
Braxton Hicks Contractions °Contractions of the uterus can occur throughout pregnancy. Contractions are not always a sign that you are in labor.  °WHAT ARE BRAXTON HICKS CONTRACTIONS?  °Contractions that occur before labor are called Braxton Hicks contractions, or false labor. Toward the end of pregnancy (32-34 weeks), these contractions can develop more often and may become more forceful. This is not true labor because these contractions do not result in opening (dilatation) and thinning of the cervix. They are sometimes difficult to tell apart from true labor because these contractions can be forceful and people have different pain tolerances. You should not feel embarrassed if you go to the hospital with false labor. Sometimes, the only way to tell if you are in true labor is for your health care provider to look for changes in the cervix. °If there are no prenatal problems or other health problems associated with the pregnancy, it is completely safe to be sent home with false labor and await the onset of true labor. °HOW CAN YOU TELL THE DIFFERENCE BETWEEN TRUE AND FALSE LABOR? °False Labor  °· The contractions of false labor are usually shorter and not as hard as those of true labor.   °· The contractions are usually irregular.   °· The contractions are often felt in the front of the lower abdomen and in the groin.   °· The contractions may go away when you walk around or change positions while lying down.   °· The contractions get weaker and are shorter lasting as time goes on.   °· The contractions do not usually become progressively stronger, regular, and closer together as with true labor.   °True Labor  °· Contractions in true labor last 30-70 seconds, become very regular, usually become more intense, and increase in frequency.   °· The contractions do not go away with walking.   °· The discomfort is usually felt in the top of the uterus and spreads to the lower abdomen and low back.   °· True labor can be  determined by your health care provider with an exam. This will show that the cervix is dilating and getting thinner.   °WHAT TO REMEMBER °· Keep up with your usual exercises and follow other instructions given by your health care provider.   °· Take medicines as directed by your health care provider.   °· Keep your regular prenatal appointments.   °· Eat and drink lightly if you think you are going into labor.   °· If Braxton Hicks contractions are making you uncomfortable:   °¨ Change your position from lying down or resting to walking, or from walking to resting.   °¨ Sit and rest in a tub of warm water.   °¨ Drink 2-3 glasses of water. Dehydration may cause these contractions.   °¨ Do slow and deep breathing several times an hour.   °WHEN SHOULD I SEEK IMMEDIATE MEDICAL CARE? °Seek immediate medical care if: °· Your contractions become stronger, more regular, and closer together.   °· You have fluid leaking or gushing from your vagina.   °· You have a fever.   °· You pass blood-tinged mucus.   °· You have vaginal bleeding.   °· You have continuous abdominal pain.   °· You have low back pain that you never had before.   °· You feel your baby's head pushing down and causing pelvic pressure.   °· Your baby is not moving as much as it used to.   °This information is not intended to replace advice given to you by your health care provider. Make sure you discuss any questions you have with your health care   provider. Document Released: 01/22/2005 Document Revised: 05/16/2015 Document Reviewed: 11/03/2012 Elsevier Interactive Patient Education  2017 [employer]. Introduction Patient Name: ________________________________________________ Patient Due Date: ____________________ What is a fetal movement count? A fetal movement count is the number of times that you feel your baby move during a certain amount of time. This may also be called a fetal kick count. A fetal movement count is recommended for every pregnant  woman. You may be asked to start counting fetal movements as early as week 28 of your pregnancy. Pay attention to when your baby is most active. You may notice your baby's sleep and wake cycles. You may also notice things that make your baby move more. You should do a fetal movement count:  When your baby is normally most active.  At the same time each day. A good time to count movements is while you are resting, after having something to eat and drink. How do I count fetal movements? 1. Find a quiet, comfortable area. Sit, or lie down on your side. 2. Write down the date, the start time and stop time, and the number of movements that you felt between those two times. Take this information with you to your health care visits. 3. For 2 hours, count kicks, flutters, swishes, rolls, and jabs. You should feel at least 10 movements during 2 hours. 4. You may stop counting after you have felt 10 movements. 5. If you do not feel 10 movements in 2 hours, have something to eat and drink. Then, keep resting and counting for 1 hour. If you feel at least 4 movements during that hour, you may stop counting. Contact a health care provider if:  You feel fewer than 4 movements in 2 hours.  Your baby is not moving like he or she usually does. Date: ____________ Start time: ____________ Stop time: ____________ Movements: ____________ Date: ____________ Start time: ____________ Stop time: ____________ Movements: ____________ Date: ____________ Start time: ____________ Stop time: ____________ Movements: ____________ Date: ____________ Start time: ____________ Stop time: ____________ Movements: ____________ Date: ____________ Start time: ____________ Stop time: ____________ Movements: ____________ Date: ____________ Start time: ____________ Stop time: ____________ Movements: ____________ Date: ____________ Start time: ____________ Stop time: ____________ Movements: ____________ Date: ____________ Start time:  ____________ Stop time: ____________ Movements: ____________ Date: ____________ Start time: ____________ Stop time: ____________ Movements: ____________ This information is not intended to replace advice given to you by your health care provider. Make sure you discuss any questions you have with your health care provider. Document Released: 02/21/2006 Document Revised: 09/21/2015 Document Reviewed: 03/03/2015 Elsevier Interactive Patient Education  2017 [employer].

## 2016-03-14 NOTE — MAU Note (Signed)
Was contracting earlier today, stopped for a few hours and have started back an hour and 20 min ago.  Unsure how often they are coming now. No bleeding or leaking

## 2016-03-14 NOTE — Progress Notes (Signed)
Pt had MAU visit on 2/3 due to decreased FM.  She reports good FM since that visit. IOL scheduled 03/20/16 @ 0730.

## 2016-03-16 ENCOUNTER — Other Ambulatory Visit: Payer: Self-pay | Admitting: Family Medicine

## 2016-03-16 ENCOUNTER — Encounter (HOSPITAL_COMMUNITY): Payer: Self-pay

## 2016-03-16 ENCOUNTER — Inpatient Hospital Stay (HOSPITAL_COMMUNITY): Payer: PPO | Admitting: Anesthesiology

## 2016-03-16 ENCOUNTER — Inpatient Hospital Stay (HOSPITAL_COMMUNITY)
Admission: AD | Admit: 2016-03-16 | Discharge: 2016-03-18 | DRG: 775 | Disposition: A | Discharge status: Home / Self Care | Payer: PPO | Source: Ambulatory Visit | Attending: Obstetrics & Gynecology | Admitting: Obstetrics & Gynecology

## 2016-03-16 DIAGNOSIS — Z148 Genetic carrier of other disease: Secondary | ICD-10-CM

## 2016-03-16 DIAGNOSIS — O99214 Obesity complicating childbirth: Secondary | ICD-10-CM | POA: Diagnosis present

## 2016-03-16 DIAGNOSIS — O26899 Other specified pregnancy related conditions, unspecified trimester: Secondary | ICD-10-CM

## 2016-03-16 DIAGNOSIS — K219 Gastro-esophageal reflux disease without esophagitis: Secondary | ICD-10-CM | POA: Diagnosis present

## 2016-03-16 DIAGNOSIS — Z87891 Personal history of nicotine dependence: Secondary | ICD-10-CM | POA: Diagnosis not present

## 2016-03-16 DIAGNOSIS — F250 Schizoaffective disorder, bipolar type: Secondary | ICD-10-CM | POA: Diagnosis not present

## 2016-03-16 DIAGNOSIS — Z3A40 40 weeks gestation of pregnancy: Secondary | ICD-10-CM | POA: Diagnosis not present

## 2016-03-16 DIAGNOSIS — Z6834 Body mass index (BMI) 34.0-34.9, adult: Secondary | ICD-10-CM | POA: Diagnosis not present

## 2016-03-16 DIAGNOSIS — J45909 Unspecified asthma, uncomplicated: Secondary | ICD-10-CM | POA: Diagnosis present

## 2016-03-16 DIAGNOSIS — Z3A41 41 weeks gestation of pregnancy: Secondary | ICD-10-CM | POA: Diagnosis not present

## 2016-03-16 DIAGNOSIS — E669 Obesity, unspecified: Secondary | ICD-10-CM | POA: Diagnosis present

## 2016-03-16 DIAGNOSIS — O9952 Diseases of the respiratory system complicating childbirth: Secondary | ICD-10-CM | POA: Diagnosis not present

## 2016-03-16 DIAGNOSIS — O99344 Other mental disorders complicating childbirth: Secondary | ICD-10-CM | POA: Diagnosis present

## 2016-03-16 DIAGNOSIS — O9962 Diseases of the digestive system complicating childbirth: Secondary | ICD-10-CM | POA: Diagnosis not present

## 2016-03-16 DIAGNOSIS — Z3493 Encounter for supervision of normal pregnancy, unspecified, third trimester: Secondary | ICD-10-CM | POA: Diagnosis not present

## 2016-03-16 DIAGNOSIS — Z6791 Unspecified blood type, Rh negative: Secondary | ICD-10-CM

## 2016-03-16 LAB — CBC
HEMATOCRIT: 35.7 % — AB (ref 36.0–46.0)
HEMOGLOBIN: 12.1 g/dL (ref 12.0–15.0)
MCH: 28.4 pg (ref 26.0–34.0)
MCHC: 33.9 g/dL (ref 30.0–36.0)
MCV: 83.8 fL (ref 78.0–100.0)
Platelets: 196 10*3/uL (ref 150–400)
RBC: 4.26 MIL/uL (ref 3.87–5.11)
RDW: 14.6 % (ref 11.5–15.5)
WBC: 14.6 10*3/uL — ABNORMAL HIGH (ref 4.0–10.5)

## 2016-03-16 LAB — TYPE AND SCREEN
ABO/RH(D): A NEG
ANTIBODY SCREEN: NEGATIVE

## 2016-03-16 MED ORDER — SERTRALINE HCL 50 MG PO TABS
50.0000 mg | ORAL_TABLET | Freq: Every day | ORAL | Status: DC
  Administered 2016-03-17 – 2016-03-18 (×2): 50 mg via ORAL
  Filled 2016-03-16 (×3): qty 1

## 2016-03-16 MED ORDER — RISPERIDONE 2 MG PO TABS
2.0000 mg | ORAL_TABLET | Freq: Two times a day (BID) | ORAL | Status: DC
  Administered 2016-03-16 – 2016-03-18 (×5): 2 mg via ORAL
  Filled 2016-03-16 (×8): qty 1

## 2016-03-16 MED ORDER — WITCH HAZEL-GLYCERIN EX PADS: 1.0000 "application " | MEDICATED_PAD | CUTANEOUS | Status: DC | PRN

## 2016-03-16 MED ORDER — LACTATED RINGERS IV SOLN
INTRAVENOUS | Status: DC
  Administered 2016-03-16: 04:00:00 via INTRAVENOUS

## 2016-03-16 MED ORDER — EPHEDRINE 5 MG/ML INJ
10.0000 mg | INTRAVENOUS | Status: DC | PRN
  Filled 2016-03-16: qty 4

## 2016-03-16 MED ORDER — ONDANSETRON HCL 4 MG/2ML IJ SOLN: 4.0000 mg | Freq: Four times a day (QID) | INTRAMUSCULAR | Status: DC | PRN

## 2016-03-16 MED ORDER — FLEET ENEMA 7-19 GM/118ML RE ENEM: 1.0000 | ENEMA | RECTAL | Status: DC | PRN

## 2016-03-16 MED ORDER — SOD CITRATE-CITRIC ACID 500-334 MG/5ML PO SOLN: 30.0000 mL | ORAL | Status: DC | PRN

## 2016-03-16 MED ORDER — COCONUT OIL OIL: 1.0000 "application " | TOPICAL_OIL | Status: DC | PRN

## 2016-03-16 MED ORDER — ZOLPIDEM TARTRATE 5 MG PO TABS: 5.0000 mg | ORAL_TABLET | Freq: Every evening | ORAL | Status: DC | PRN

## 2016-03-16 MED ORDER — RISPERIDONE 2 MG PO TABS
2.0000 mg | ORAL_TABLET | Freq: Two times a day (BID) | ORAL | Status: DC
  Administered 2016-03-16: 2 mg via ORAL
  Filled 2016-03-16 (×3): qty 1

## 2016-03-16 MED ORDER — OXYCODONE-ACETAMINOPHEN 5-325 MG PO TABS: 1.0000 | ORAL_TABLET | ORAL | Status: DC | PRN

## 2016-03-16 MED ORDER — ACETAMINOPHEN 325 MG PO TABS
650.0000 mg | ORAL_TABLET | ORAL | Status: DC | PRN
  Administered 2016-03-16 – 2016-03-17 (×2): 650 mg via ORAL
  Filled 2016-03-16 (×2): qty 2

## 2016-03-16 MED ORDER — OXYTOCIN 40 UNITS IN LACTATED RINGERS INFUSION - SIMPLE MED
2.5000 [IU]/h | INTRAVENOUS | Status: DC
  Filled 2016-03-16: qty 1000

## 2016-03-16 MED ORDER — DIBUCAINE 1 % RE OINT: 1.0000 "application " | TOPICAL_OINTMENT | RECTAL | Status: DC | PRN

## 2016-03-16 MED ORDER — OXYTOCIN BOLUS FROM INFUSION
500.0000 mL | Freq: Once | INTRAVENOUS | Status: AC
Start: 2016-03-16 — End: 2016-03-16
  Administered 2016-03-16: 500 mL via INTRAVENOUS

## 2016-03-16 MED ORDER — FENTANYL 2.5 MCG/ML BUPIVACAINE 1/10 % EPIDURAL INFUSION (WH - ANES)
14.0000 mL/h | INTRAMUSCULAR | Status: DC | PRN
  Administered 2016-03-16: 14 mL/h via EPIDURAL

## 2016-03-16 MED ORDER — ALBUTEROL SULFATE (2.5 MG/3ML) 0.083% IN NEBU: 3.0000 mL | INHALATION_SOLUTION | Freq: Four times a day (QID) | RESPIRATORY_TRACT | Status: DC | PRN

## 2016-03-16 MED ORDER — TETANUS-DIPHTH-ACELL PERTUSSIS 5-2.5-18.5 LF-MCG/0.5 IM SUSP: 0.5000 mL | Freq: Once | INTRAMUSCULAR | Status: DC

## 2016-03-16 MED ORDER — SENNOSIDES-DOCUSATE SODIUM 8.6-50 MG PO TABS
2.0000 | ORAL_TABLET | ORAL | Status: DC
  Administered 2016-03-17 – 2016-03-18 (×3): 2 via ORAL
  Filled 2016-03-16 (×3): qty 2

## 2016-03-16 MED ORDER — ONDANSETRON HCL 4 MG/2ML IJ SOLN: 4.0000 mg | INTRAMUSCULAR | Status: DC | PRN

## 2016-03-16 MED ORDER — LACTATED RINGERS IV SOLN: 500.0000 mL | Freq: Once | INTRAVENOUS | Status: DC

## 2016-03-16 MED ORDER — ACETAMINOPHEN 325 MG PO TABS: 650.0000 mg | ORAL_TABLET | ORAL | Status: DC | PRN

## 2016-03-16 MED ORDER — ONDANSETRON HCL 4 MG PO TABS: 4.0000 mg | ORAL_TABLET | ORAL | Status: DC | PRN

## 2016-03-16 MED ORDER — LIDOCAINE HCL (PF) 1 % IJ SOLN
30.0000 mL | INTRAMUSCULAR | Status: AC | PRN
  Administered 2016-03-16: 30 mL via SUBCUTANEOUS
  Filled 2016-03-16: qty 30

## 2016-03-16 MED ORDER — PHENYLEPHRINE 40 MCG/ML (10ML) SYRINGE FOR IV PUSH (FOR BLOOD PRESSURE SUPPORT)
80.0000 ug | PREFILLED_SYRINGE | INTRAVENOUS | Status: DC | PRN
  Filled 2016-03-16: qty 5

## 2016-03-16 MED ORDER — MOMETASONE FURO-FORMOTEROL FUM 200-5 MCG/ACT IN AERO
2.0000 | INHALATION_SPRAY | Freq: Two times a day (BID) | RESPIRATORY_TRACT | Status: DC
  Administered 2016-03-16 – 2016-03-18 (×5): 2 via RESPIRATORY_TRACT
  Filled 2016-03-16: qty 8.8

## 2016-03-16 MED ORDER — SIMETHICONE 80 MG PO CHEW: 80.0000 mg | CHEWABLE_TABLET | ORAL | Status: DC | PRN

## 2016-03-16 MED ORDER — LEVETIRACETAM 750 MG PO TABS
750.0000 mg | ORAL_TABLET | Freq: Three times a day (TID) | ORAL | Status: DC
  Administered 2016-03-16 – 2016-03-18 (×8): 750 mg via ORAL
  Filled 2016-03-16 (×9): qty 1

## 2016-03-16 MED ORDER — FENTANYL 2.5 MCG/ML BUPIVACAINE 1/10 % EPIDURAL INFUSION (WH - ANES)
INTRAMUSCULAR | Status: AC
  Filled 2016-03-16: qty 100

## 2016-03-16 MED ORDER — PRENATAL MULTIVITAMIN CH
1.0000 | ORAL_TABLET | Freq: Every day | ORAL | Status: DC
  Administered 2016-03-17 – 2016-03-18 (×2): 1 via ORAL
  Filled 2016-03-16 (×2): qty 1

## 2016-03-16 MED ORDER — OXYCODONE-ACETAMINOPHEN 5-325 MG PO TABS: 2.0000 | ORAL_TABLET | ORAL | Status: DC | PRN

## 2016-03-16 MED ORDER — LABETALOL HCL 100 MG PO TABS
100.0000 mg | ORAL_TABLET | Freq: Two times a day (BID) | ORAL | Status: DC
  Administered 2016-03-16: 100 mg via ORAL
  Filled 2016-03-16: qty 1

## 2016-03-16 MED ORDER — LIDOCAINE HCL (PF) 1 % IJ SOLN
INTRAMUSCULAR | Status: DC | PRN
  Administered 2016-03-16 (×2): 4 mL via EPIDURAL

## 2016-03-16 MED ORDER — LACTATED RINGERS IV SOLN: 500.0000 mL | INTRAVENOUS | Status: DC | PRN

## 2016-03-16 MED ORDER — SERTRALINE HCL 50 MG PO TABS
50.0000 mg | ORAL_TABLET | Freq: Every day | ORAL | Status: DC
  Administered 2016-03-16: 50 mg via ORAL
  Filled 2016-03-16 (×2): qty 1

## 2016-03-16 MED ORDER — IBUPROFEN 600 MG PO TABS
600.0000 mg | ORAL_TABLET | Freq: Four times a day (QID) | ORAL | Status: DC
  Administered 2016-03-16 – 2016-03-18 (×9): 600 mg via ORAL
  Filled 2016-03-16 (×9): qty 1

## 2016-03-16 MED ORDER — LEVETIRACETAM 750 MG PO TABS
750.0000 mg | ORAL_TABLET | Freq: Three times a day (TID) | ORAL | Status: DC
  Administered 2016-03-16: 750 mg via ORAL
  Filled 2016-03-16 (×4): qty 1

## 2016-03-16 MED ORDER — DIPHENHYDRAMINE HCL 25 MG PO CAPS
25.0000 mg | ORAL_CAPSULE | Freq: Four times a day (QID) | ORAL | Status: DC | PRN
  Administered 2016-03-16: 25 mg via ORAL
  Filled 2016-03-16: qty 1

## 2016-03-16 MED ORDER — BENZOCAINE-MENTHOL 20-0.5 % EX AERO
1.0000 "application " | INHALATION_SPRAY | CUTANEOUS | Status: DC | PRN
  Administered 2016-03-17: 1 via TOPICAL
  Filled 2016-03-16: qty 56

## 2016-03-16 MED ORDER — DIPHENHYDRAMINE HCL 50 MG/ML IJ SOLN: 12.5000 mg | INTRAMUSCULAR | Status: DC | PRN

## 2016-03-16 MED ORDER — PHENYLEPHRINE 40 MCG/ML (10ML) SYRINGE FOR IV PUSH (FOR BLOOD PRESSURE SUPPORT)
PREFILLED_SYRINGE | INTRAVENOUS | Status: AC
  Filled 2016-03-16: qty 10

## 2016-03-16 NOTE — Progress Notes (Signed)
Evaluated patient for progress of labor. She is feeling comfortable. Now complete and +1. Will labor down and start pushing when patient complains of increased pressure. Continue to monitor closely. Anticipate SVD.  [name] L. Rosalyn Gess, MD PGY-1 03/16/2016 9:51 AM

## 2016-03-16 NOTE — Anesthesia Postprocedure Evaluation (Signed)
Anesthesia Post Note  Patient: [name]  Procedure(s) Performed: * No procedures listed *  Patient location during evaluation: Mother Baby Anesthesia Type: Epidural Level of consciousness: awake and alert Pain management: pain level controlled Vital Signs Assessment: post-procedure vital signs reviewed and stable Respiratory status: spontaneous breathing, nonlabored ventilation and respiratory function stable Cardiovascular status: stable Postop Assessment: no headache, no backache and epidural receding Anesthetic complications: no        Last Vitals:  Vitals:   03/16/16 1240 03/16/16 1348  BP: (!) 120/59 (!) 112/56  Pulse: (!) 106 (!) 128  Resp: 20 20  Temp:  37 C    Last Pain:  Vitals:   03/16/16 1400  TempSrc:   PainSc: 4    Pain Goal:                 [employer]

## 2016-03-16 NOTE — Anesthesia Pain Management Evaluation Note (Signed)
  CRNA Pain Management Visit Note  Patient: [name], 26 y.o., female  "Hello I am a member of the anesthesia team at [facility]. We have an anesthesia team available at all times to provide care throughout the hospital, including epidural management and anesthesia for C-section. I don't know your plan for the delivery whether it a natural birth, water birth, IV sedation, nitrous supplementation, doula or epidural, but we want to meet your pain goals."   1.Was your pain managed to your expectations on prior hospitalizations?   No prior hospitalizations  2.What is your expectation for pain management during this hospitalization?     Epidural  3.How can we help you reach that goal? Epidural in situ.  Record the patient's initial score and the patient's pain goal.   Pain: 2  Pain Goal: 4 [facility] wants you to be able to say your pain was always managed very well.  Josilyn Shippee L 03/16/2016

## 2016-03-16 NOTE — MAU Note (Signed)
Presents to MAU for increased contractions every 2-6 minutes that began around 2100 last night.  Also complaining of decreased FM tonight.  No gushes of fluid or vaginal discharge.  Reports some spotting that began tonight around 0100.  Last intercourse yesterday afternoon.

## 2016-03-16 NOTE — H&P (Signed)
[name] is a 26 y.o. female G22P0010 with IUP at [redacted]w[redacted]d presenting for contractions since 2100. .  Membranes are intact, with active fetal movement.   [facility] 8 wks     Past Medical History: Past Medical History:  Diagnosis Date  . Acid reflux   . Asthma   . Autism   . 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 Chronic constipation   . Depression   . Drug-seeking behavior   . Essential tremor   . Personality disorder   . Schizo-affective psychosis ([place])   . Schizoaffective disorder, bipolar type ([place])   . Seizures [facility])    Last seizure December 2017    Past Surgical History: Past Surgical History:  Procedure Laterality Date  . MOUTH SURGERY      Obstetrical History: OB History    Gravida Para Term Preterm AB Living   2       1 0   SAB TAB Ectopic Multiple Live Births   1               Social History: Social History   Social History  . Marital status: Married    Spouse name: N/A  . Number of children: 0  . Years of education: N/A   Occupational History  . disability    Social History Main Topics  . Smoking status: Former Smoker    Packs/day: 0.00  . Smokeless tobacco: Never Used     Comment: Smoked for 2  years age [age]  . Alcohol use No  . Drug use: No     Comment: History of cocaine use at age 72 for 4 months  . Sexual activity: Yes    Birth control/ protection: None   Other Topics Concern  . None   Social History Narrative   Marital status: married; guardian is Furniture conservator/restorer      Children: none      Lives: with husband in apartment      Employment:  Disability      Tobacco: quit smoking; smoked for two years.      Alcohol ;none      Drugs: none            Family History: Family History  Problem Relation Age of Onset  . Mental illness Father   . Asthma Father   . PDD Brother   . Seizures Brother     Allergies: Allergies  Allergen Reactions  . Bee Venom Anaphylaxis  . Coconut Flavor Anaphylaxis  . Geodon [Ziprasidone  Hcl] Other (See Comments)    Pt states that this medication causes paralysis of the mouth.    . Haloperidol And Related Other (See Comments)    Pt states that this medication causes paralysis of the mouth.    . Lithium Other (See Comments)    Reaction:  Seizure-like activity.    . Oxycodone Other (See Comments)    Reaction:  Hallucinations   . Seroquel [Quetiapine Fumarate] Other (See Comments)    Pt states that this medication is too strong.   . Shellfish Allergy Anaphylaxis  . Percocet [Oxycodone-Acetaminophen] Other (See Comments)    Reaction:  Hallucinations   . Phenergan [Promethazine Hcl] Other (See Comments)    Reaction:  Chest pain    . Prilosec [Omeprazole] Nausea And Vomiting  . Tegretol [Carbamazepine] Nausea And Vomiting    Prescriptions Prior to Admission  Medication Sig Dispense Refill Last Dose  . acetaminophen (TYLENOL) 500 MG tablet Take  1,000 mg by mouth every 6 (six) hours as needed for mild pain, moderate pain or headache.   03/14/2016 at Unknown time  . albuterol (PROVENTIL HFA;VENTOLIN HFA) 108 (90 Base) MCG/ACT inhaler Inhale 1-2 puffs into the lungs every 6 (six) hours as needed for wheezing or shortness of breath.   03/13/2016 at Unknown time  . calcium carbonate (TUMS - DOSED IN MG ELEMENTAL CALCIUM) 500 MG chewable tablet Chew 2 tablets by mouth as needed for indigestion or heartburn.    03/13/2016 at Unknown time  . diphenhydrAMINE (BENADRYL) 25 mg capsule Take 50 mg by mouth at bedtime as needed for sleep.    03/13/2016 at Unknown time  . docusate sodium (COLACE) 100 MG capsule Take 200 mg by mouth daily.    03/14/2016 at Unknown time  . Ergocalciferol (VITAMIN D2) 400 units TABS Take 800 Units by mouth daily.   03/14/2016 at Unknown time  . fluticasone-salmeterol (ADVAIR HFA) 115-21 MCG/ACT inhaler Inhale 2 puffs into the lungs 2 (two) times daily. 1 Inhaler 5 03/14/2016 at Unknown time  . labetalol (NORMODYNE) 100 MG tablet Take 1 tablet (100 mg total) by mouth 2 (two)  times daily. 60 tablet 2 03/14/2016 at 0700  . levETIRAcetam (KEPPRA) 750 MG tablet Take 1 tablet (750 mg total) by mouth 3 (three) times daily. 90 tablet 11 03/14/2016 at Unknown time  . Menthol, Topical Analgesic, (BENGAY EX) Apply 1 application topically as needed (for pain).    03/13/2016 at Unknown time  . Prenatal Vit-Fe Fumarate-FA (PRENATAL MULTIVITAMIN) TABS tablet Take 1 tablet by mouth daily at 12 noon.   03/13/2016 at Unknown time  . pyridOXINE (VITAMIN B-6) 100 MG tablet Take 200 mg by mouth 2 (two) times daily.   03/14/2016 at Unknown time  . ranitidine (ZANTAC) 150 MG tablet Take 150 mg by mouth 2 (two) times daily.   03/14/2016 at Unknown time  . risperiDONE (RISPERDAL) 2 MG tablet Take 2 mg by mouth 2 (two) times daily.   0 03/14/2016 at Unknown time  . senna (SENOKOT) 8.6 MG tablet Take 2 tablets by mouth at bedtime.    03/13/2016 at Unknown time  . sertraline (ZOLOFT) 50 MG tablet Take 50 mg by mouth daily.   03/14/2016 at Unknown time        Review of Systems   Constitutional: Negative for fever and chills Eyes: Negative for visual disturbances Respiratory: Negative for shortness of breath, dyspnea Cardiovascular: Negative for chest pain or palpitations  Gastrointestinal: Negative for vomiting, diarrhea and constipation.  POSITIVE for abdominal pain (contractions) Genitourinary: Negative for dysuria and urgency Musculoskeletal: Negative for back pain, joint pain, myalgias  Neurological: Negative for dizziness and headaches      Blood pressure 125/69, pulse 105, temperature 97.6 F (36.4 C), temperature source Oral, resp. rate 20, last menstrual period 06/07/2015. General appearance: alert, cooperative and no distress Lungs: clear to auscultation bilaterally Heart: regular rate and rhythm Abdomen: soft, non-tender; bowel sounds normal Extremities: Homans sign is negative, no sign of DVT DTR's 2+ Presentation: cephalic Fetal monitoring  Baseline: 150 bpm, Variability: Good {> 6  bpm), Accelerations: Reactive and Decelerations: none Uterine activity  3-4 Dilation: 4 Effacement (%): 50 Station: -1, -2 Exam by:: Gennie Alma, RN   Prenatal labs: ABO, Rh: A/NEG/-- (07/03 1002) Antibody: NEG (11/13 1104) Rubella: !Error! RPR: NON REAC (11/13 1104)  HBsAg: NEGATIVE (07/03 1002)  HIV: NONREACTIVE (11/13 1104)  GBS: Negative (01/16 0000)    Clinic  WOC Prenatal Labs  Dating  LMP c/w 1st trimester sono Blood type: --/--/A NEG (04/04 1923)   Genetic Screen  AFP: nml     NIPS: Normal Antibody: negative  Anatomic [name] Anterior previa, nml anatomy - female Rubella:  Immune  GTT Early:  114             Third trimester: 88/117/130 RPR: Non Reactive (03/31 2008) //neg  Flu vaccine declined HBsAg:   negative  TDaP vaccine          12/19/15      Rhogam:  12/19/15 HIV: Non Reactive (04/04 1923) //neg  Baby Food   formula                                            GBS: negative (For PCN allergy, check sensitivities)  Contraception  OCPs Pap: will obtain records from [facility]- patient declined exam on 08/08/15  Circumcision   N/A - girl   Pediatrician [facility]   Support Person Husband        No results found for this or any previous visit (from the past 24 hour(s)).  Assessment: RYLYNN MINNIEAR is a 26 y.o. G2P0010 with an IUP at [redacted]w[redacted]d presenting for labor  Plan: #Labor: expectant management #Pain:  Per request #FWB Cat 1    [name],[name] Fake 03/16/2016, 3:27 AM

## 2016-03-16 NOTE — Progress Notes (Signed)
Patient seen doing well. Comfortable with epdirual. AROM for no minimal clear fluid. Patient complete. Will labor down for 1 hour then begin pushing.

## 2016-03-16 NOTE — Anesthesia Preprocedure Evaluation (Signed)
Anesthesia Evaluation  Patient identified by MRN, date of birth, ID band Patient awake    Reviewed: Allergy & Precautions, NPO status , Patient's Chart, lab work & pertinent test results, reviewed documented beta blocker date and time   Airway Mallampati: IV   Neck ROM: Full  Mouth opening: Limited Mouth Opening  Dental  (+) Teeth Intact, Poor Dentition   Pulmonary asthma , former smoker,    Pulmonary exam normal breath sounds clear to auscultation       Cardiovascular Normal cardiovascular exam+ dysrhythmias  Rhythm:Regular Rate:Normal  Tachycardia   Neuro/Psych  Headaches, Seizures -, Well Controlled,  PSYCHIATRIC DISORDERS Depression Bipolar Disorder Schizophrenia Schizoaffective disorder Autism Essential tremor Fragile x Carrier    GI/Hepatic Neg liver ROS, GERD  Medicated and Controlled,  Endo/Other  Obesity  Renal/GU negative Renal ROS  negative genitourinary   Musculoskeletal negative musculoskeletal ROS (+)   Abdominal (+) + obese,   Peds  Hematology negative hematology ROS (+)   Anesthesia Other Findings   Reproductive/Obstetrics (+) Pregnancy                             Anesthesia Physical Anesthesia Plan  ASA: III  Anesthesia Plan: Epidural   Post-op Pain Management:    Induction:   Airway Management Planned: Natural Airway  Additional Equipment:   Intra-op Plan:   Post-operative Plan:   Informed Consent: I have reviewed the patients History and Physical, chart, labs and discussed the procedure including the risks, benefits and alternatives for the proposed anesthesia with the patient or authorized representative who has indicated his/her understanding and acceptance.     Plan Discussed with: Anesthesiologist  Anesthesia Plan Comments:         Anesthesia Quick Evaluation

## 2016-03-16 NOTE — Progress Notes (Signed)
To room 145 in wheelchair, holding infant, fob at side

## 2016-03-16 NOTE — Anesthesia Procedure Notes (Signed)
Epidural Patient location during procedure: OB Start time: 03/16/2016 4:59 AM  Staffing Anesthesiologist: Josephine Igo Performed: anesthesiologist   Preanesthetic Checklist Completed: patient identified, site marked, surgical consent, pre-op evaluation, timeout performed, IV checked, risks and benefits discussed and monitors and equipment checked  Epidural Patient position: sitting Prep: site prepped and draped and DuraPrep Patient monitoring: continuous pulse ox and blood pressure Approach: midline Location: L3-L4 Injection technique: LOR air  Needle:  Needle type: Tuohy  Needle gauge: 17 G Needle length: 9 cm and 9 Needle insertion depth: 6 cm Catheter type: closed end flexible Catheter size: 19 Gauge Catheter at skin depth: 11 cm Test dose: negative and Other  Assessment Events: blood not aspirated, injection not painful, no injection resistance, negative IV test and no paresthesia  Additional Notes Patient identified. Risks and benefits discussed including failed block, incomplete  Pain control, post dural puncture headache, nerve damage, paralysis, blood pressure Changes, nausea, vomiting, reactions to medications-both toxic and allergic and post Partum back pain. All questions were answered. Patient expressed understanding and wished to proceed. Sterile technique was used throughout procedure. Epidural site was Dressed with sterile barrier dressing. No paresthesias, signs of intravascular injection Or signs of intrathecal spread were encountered.  Patient was more comfortable after the epidural was dosed. Please see RN's note for documentation of vital signs and FHR which are stable.

## 2016-03-17 ENCOUNTER — Encounter (HOSPITAL_COMMUNITY): Payer: Self-pay | Admitting: *Deleted

## 2016-03-17 LAB — RPR: RPR: NONREACTIVE

## 2016-03-17 MED ORDER — RHO D IMMUNE GLOBULIN 1500 UNIT/2ML IJ SOSY
300.0000 ug | PREFILLED_SYRINGE | Freq: Once | INTRAMUSCULAR | Status: AC
  Administered 2016-03-17: 300 ug via INTRAVENOUS
  Filled 2016-03-17: qty 2

## 2016-03-17 NOTE — Progress Notes (Signed)
Patient ID: [name], female   DOB: [birth year], 26 y.o.   MRN: [id number]  POSTPARTUM PROGRESS NOTE  Post Partum Day #1 Subjective:  [name] is a 26 y.o. G2P1011 [redacted]w[redacted]d s/p NSVD.  No acute events overnight.  Pt denies problems with ambulating, voiding or po intake.  She denies nausea or vomiting.  Pain is well controlled.  She has had flatus. She has not had bowel movement.  Lochia Small. Tachycardia has resolved, patient asymptomatic.  Objective: Blood pressure 123/60, pulse 88, temperature 98.2 F (36.8 C), resp. rate 18, height 5\' 5"  (1.651 m), weight 207 lb (93.9 kg), last menstrual period 06/07/2015, SpO2 99 %, unknown if currently breastfeeding.  Physical Exam:  General: alert, cooperative and no distress Lochia:normal flow Chest: CTAB Heart: RRR no m/r/g Abdomen: +BS, soft, nontender,  Uterine Fundus: firm, below umbilicus DVT Evaluation: No calf swelling or tenderness Extremities: Trace edema   Recent Labs  03/16/16 0342  HGB 12.1  HCT 35.7*    Assessment/Plan:  ASSESSMENT: [name] is a 26 y.o. G2P1011 [redacted]w[redacted]d s/p NSVD  Tachycardia resolved Plan for discharge tomorrow and Contraception OCPs  Bottle feeding    LOS: 1 day   Katherine Basset, [place] for [facility], [facility] 03/17/2016, 3:37 PM

## 2016-03-17 NOTE — Progress Notes (Signed)
CLINICAL SOCIAL WORK MATERNAL/CHILD NOTE  Patient Details  Name: [name] MRN: [id number] Date of Birth: 03/16/2018  Date:  03/17/2016  Clinical Social Worker Initiating Note:  Inga Noller, MSW, LCSW-A  Date/ Time Initiated:  03/17/16/1326     Child's Name:  [name]    Legal Guardian:  Other (Comment) (Not established by court system; MOB and FOB (Norman Dobesh)  parent collectively in primary household composition )   Need for Interpreter:  None   Date of Referral:  03/16/16     Reason for Referral:  Other (Comment) (MOB hx of personality disorder; drug seeking behaviors; suicide attempt in 2017)   Referral Source:  Central Nursery   Address:  [address] [place], [place] [address]  Phone number:  [phone]   Household Members:  Self, Spouse   Natural Supports (not living in the home):  Immediate Family, Friends, Parent, Community, Other (Comment) (Legal Guardian- [name]-[phone] ; Act Team- Gina Davidson - [phone])   Professional Supports:     Employment: Unemployed   Type of Work: Unemployed    Education:      Financial Resources:  Medicaid   Other Resources:  WIC, Food Stamps    Cultural/Religious Considerations Which May Impact Care:  Christian per face sheet.   Strengths:  Ability to meet basic needs , Compliance with medical plan , Home prepared for child , Pediatrician chosen  ([facility] )   Risk Factors/Current Problems:  Intellectual Development Disorder , Mental Health Concerns  (MOB and FOB have IDD. MOB has dx of Aspergers, Schizoaffective unspecified disorder and Bipolar Unspecified Disorder )   Cognitive State:  Alert , Able to Concentrate , Goal Oriented , Insightful    Mood/Affect:  Calm , Comfortable , Interested    CSW Assessment: CSW met with MOB at bedside to complete assessment for consult regarding hx of suicide attempt in 2017. Upon this writer's  arrival, MOB was reading menu while FOB and FOB's mother were bonding with the baby. With MOB's permission, this writer explained role and reasoning for visit being to assess on going care of baby and psychosocial supports. MOB and FOB were both pleasant and engaging to this writer. CSW explained to MOB and FOB that she is aware of history of mental health dx schizoaffective unspecified disorder, bipolar unspecified disorder and Asperges. When CSW stated these, MOB was able to identify and confirm she has said dx and further notes she takes medicine for all of them given to her by the ACT team. MOB describes these medications as Zoloft and Spiradol. MOB notes medications help her hearing voices and her different mood swings. CSW praised MOB for being able to acknowledge and be forthcoming regarding behavioral health dx. This writer talked to MOB about the importance of taking medications as prescribed given that she now has a responsibility to her child. MOB verbalized understanding noting that her medication dosage was increased so that she could better manage baby's arrival. CSW inquired about MOB's feelings since baby's arrival. MOB notes she is happy about baby's arrival. She further notes her ACT team has taught her a lot about what to expect and how to care for the baby when they go home. CSW educated MOB on safe sleeping/SIDS and PPD. CSW requested both MOB and FOB do teach back to confirm understanding. Both MOB and FOB were able to do so. This writer informed MOB of the importance of seeking medical attention if she   feel she is sad and cannot take care of the baby. MOB verbalized understanding.   CSW assessed MOB hx of substance use and suicide attempt. MOB was unable to provide insight on this. CSW inquired about MOB's guardian [name]. MOB notes she and Evelyn have a good relationship and she identifies Evelyn as a support for her, FOB and baby. MOB denies and use of substance during pregnancy and a  desire to use now that she has delivered. This writer informed MOB of the hospitals policy and procedure regarding hx of substance and 2 drug screens taken of baby. MOB understood and was able to explain it back to me.   CSW assessed MOB and FOB preparedness to go home with baby. MOB notes they have everything they need such as a car seat, crib, basinet, swing, diapers and clothes. This writer inquired about having milk/breast feeding supply's for baby to eat. MOB notes she is not breast feeing because she is taking medications and she feels she is not producing any milk. MOB further notes she has set up baby's appointment with [facility]. This writer educated MOB and FOB on Medicaid insurance coverage for baby up to 45 days. CSW informed MOB that she needs to call on Monday and schedule an appt to go to the Medicaid office and officially enroll baby in coverage. MOB understood noting that her husband will drive her as she does not drive but he does. This writer inquired if FOB works and if transportation will be an issue in the future. MOB notes her husband and she go everywhere they need to go and transportation is not an issue.   Collateral:  CSW contacted patients guardian, [name], to inquire if there were any concerns/barriers to MOB and FOB carrying for baby. Evelyn noted to this writer that he came and to visit with patient FOB and the baby yesterday and both FOB and MOB seemed appropriate in providing care for the baby. Evelyn further noted his only concern would be that both parents have behavioral health concerns (was unable to identify was FOB's dx were) and have no experience with children. Evelyn informed this writer that he contacted DSS to get guidance on what usually happens in this situation and how they can get involved if at all. Evelyn noted DSS informed him the only way they would get involved is if there was a hx of abuse/neglect and/or substance abuse affecting the care of the  child. Evelyn noted because there is not, DSS has no involvement unless a formal report is made.   CSW spoke with RN covering MOB and baby's room. RN notes she has no concerns noting both MOB and FOB have been very appropriate and asked appropriate questions regarding care of baby.   CSW spoke with pediatrician nurse practitioner Jennifer in nursery who noted she first met with them yesterday and no concerns. Jennifer notes both FOB and MOB was interested and able to perform teach back of care for child.   Upon completion of the assessment, CSW has no concerns regarding care of child with FOB and MOB. Both parents identify community supports and family supports that are available to help in the care of baby. CSW will continue to follow pending cord blood screen results; otherwise, CSW has no barriers to d/c.   CSW Plan/Description:  Patient/Family Education , Information/Referral to Community Resources , No Further Intervention Required/No Barriers to Discharge, Other (Comment) (CSW will continue to follow pending UDS and cord blood   screen )   Azaan Leask, MSW, LCSW-A Clinical Social Worker  [facility]  Office: [phone]   

## 2016-03-18 LAB — RH IG WORKUP (INCLUDES ABO/RH)
ABO/RH(D): A NEG
ANTIBODY SCREEN: NEGATIVE
FETAL SCREEN: NEGATIVE
Gestational Age(Wks): 40
UNIT DIVISION: 0

## 2016-03-18 MED ORDER — IBUPROFEN 600 MG PO TABS: 600.0000 mg | ORAL_TABLET | Freq: Four times a day (QID) | ORAL | 0 refills | Status: DC

## 2016-03-18 MED ORDER — FUROSEMIDE 20 MG PO TABS
20.0000 mg | ORAL_TABLET | Freq: Once | ORAL | Status: AC
  Administered 2016-03-18: 20 mg via ORAL
  Filled 2016-03-18: qty 1

## 2016-03-18 NOTE — Discharge Summary (Signed)
OB Discharge Summary     Patient Name: [name] DOB: 22-Nov-1990 MRN: [id number]  Date of admission: 03/16/2016 Delivering MD: Jacquiline Doe MICHAEL   Date of discharge: 03/18/2016  Admitting diagnosis: [redacted]w[redacted]d, Contractions  Intrauterine pregnancy: [redacted]w[redacted]d     Secondary diagnosis:  Principal Problem:   NSVD (normal spontaneous vaginal delivery) Active Problems:   Indication for care in labor or delivery  Additional problems: None     Discharge diagnosis: Term Pregnancy Delivered                                                                                                Post partum procedures:rhogam - received  Augmentation: AROM  Complications: None  Hospital course:  Onset of Labor With Vaginal Delivery     26 y.o. yo G2P1011 at [redacted]w[redacted]d was admitted in Active Labor on 03/16/2016. Patient had an uncomplicated labor course as follows:  Membrane Rupture Time/Date: 9:18 AM ,03/16/2016   Intrapartum Procedures: Episiotomy: None [1]                                         Lacerations:  Labial [10]  Patient had a delivery of a Viable infant. 03/16/2016  Information for the patient's newborn:  Tyneka, Montani [id number]  Delivery Method: Vaginal, Spontaneous Delivery (Filed from Delivery Summary)    Pateint had an uncomplicated postpartum course.  She is ambulating, tolerating a regular diet, passing flatus, and urinating well. Patient is discharged home in stable condition on 03/18/16.   Physical exam  Vitals:   03/16/16 2007 03/17/16 0520 03/17/16 1912 03/18/16 0537  BP: 113/69 123/60 128/64 115/63  Pulse: (!) 114 88 (!) 105 100  Resp: 18  16 18   Temp: 98.3 F (36.8 C) 98.2 F (36.8 C) 98.5 F (36.9 C) 98.6 F (37 C)  TempSrc: Oral  Oral Oral  SpO2: 99%     Weight:      Height:       General: alert, cooperative and no distress Lochia: appropriate Uterine Fundus: firm Incision: N/A DVT Evaluation: No evidence of DVT seen on physical exam. Negative Homan's  sign. No cords or calf tenderness. Labs: Lab Results  Component Value Date   WBC 14.6 (H) 03/16/2016   HGB 12.1 03/16/2016   HCT 35.7 (L) 03/16/2016   MCV 83.8 03/16/2016   PLT 196 03/16/2016   CMP Latest Ref Rng & Units 12/16/2015  Glucose 65 - 99 mg/dL 108(H)  BUN 6 - 20 mg/dL <5(L)  Creatinine 0.44 - 1.00 mg/dL 0.49  Sodium 135 - 145 mmol/L 135  Potassium 3.5 - 5.1 mmol/L 3.3(L)  Chloride 101 - 111 mmol/L 109  CO2 22 - 32 mmol/L 19(L)  Calcium 8.9 - 10.3 mg/dL 9.0  Total Protein 6.5 - 8.1 g/dL 5.9(L)  Total Bilirubin 0.3 - 1.2 mg/dL 0.1(L)  Alkaline Phos 38 - 126 U/L 39  AST 15 - 41 U/L 19  ALT 14 - 54 U/L 18    Discharge instruction: per After  Visit Summary and "Baby and Me Booklet".  After visit meds:  Allergies as of 03/18/2016      Reactions   Bee Venom Anaphylaxis   Coconut Flavor Anaphylaxis   Geodon [ziprasidone Hcl] Other (See Comments)   Pt states that this medication causes paralysis of the mouth.     Haloperidol And Related Other (See Comments)   Pt states that this medication causes paralysis of the mouth.     Lithium Other (See Comments)   Reaction:  Seizure-like activity.    Oxycodone Other (See Comments)   Reaction:  Hallucinations    Seroquel [quetiapine Fumarate] Other (See Comments)   Pt states that this medication is too strong.    Shellfish Allergy Anaphylaxis   Percocet [oxycodone-acetaminophen] Other (See Comments)   Reaction:  Hallucinations    Phenergan [promethazine Hcl] Other (See Comments)   Reaction:  Chest pain    Prilosec [omeprazole] Nausea And Vomiting   Tegretol [carbamazepine] Nausea And Vomiting      Medication List    STOP taking these medications   BENGAY EX   diphenhydrAMINE 25 mg capsule Commonly known as:  BENADRYL   labetalol 100 MG tablet Commonly known as:  NORMODYNE   pyridOXINE 100 MG tablet Commonly known as:  VITAMIN B-6   ranitidine 150 MG tablet Commonly known as:  ZANTAC     TAKE these  medications   acetaminophen 500 MG tablet Commonly known as:  TYLENOL Take 1,000 mg by mouth every 6 (six) hours as needed for mild pain, moderate pain or headache.   albuterol 108 (90 Base) MCG/ACT inhaler Commonly known as:  PROVENTIL HFA;VENTOLIN HFA Inhale 1-2 puffs into the lungs every 6 (six) hours as needed for wheezing or shortness of breath.   calcium carbonate 500 MG chewable tablet Commonly known as:  TUMS - dosed in mg elemental calcium Chew 2 tablets by mouth as needed for indigestion or heartburn.   docusate sodium 100 MG capsule Commonly known as:  COLACE Take 200 mg by mouth daily.   fluticasone-salmeterol 115-21 MCG/ACT inhaler Commonly known as:  ADVAIR HFA Inhale 2 puffs into the lungs 2 (two) times daily.   ibuprofen 600 MG tablet Commonly known as:  ADVIL,MOTRIN Take 1 tablet (600 mg total) by mouth every 6 (six) hours.   levETIRAcetam 750 MG tablet Commonly known as:  KEPPRA Take 1 tablet (750 mg total) by mouth 3 (three) times daily.   prenatal multivitamin Tabs tablet Take 1 tablet by mouth daily at 12 noon.   risperiDONE 2 MG tablet Commonly known as:  RISPERDAL Take 2 mg by mouth 2 (two) times daily.   senna 8.6 MG tablet Commonly known as:  SENOKOT Take 2 tablets by mouth at bedtime.   sertraline 50 MG tablet Commonly known as:  ZOLOFT Take 50 mg by mouth daily.   Vitamin D2 400 units Tabs Take 800 Units by mouth daily.       Diet: routine diet  Activity: Advance as tolerated. Pelvic rest for 6 weeks.   Outpatient follow up:2 weeks - needs close behavioral health follow up Follow up Appt:Future Appointments Date Time Provider [place]  06/13/2016 3:00 PM Britt Bottom, MD [facility] None   Follow up Visit:No Follow-up on file.  Postpartum contraception: Combination OCPs  Newborn Data: Live born female  Birth Weight: 7 lb 12.7 oz (3535 g) APGAR: 5, 8  Baby Feeding: Bottle Disposition:home with  mother   03/18/2016 Katherine Basset, DO OB Fellow

## 2016-03-18 NOTE — Progress Notes (Signed)
CSW contacted [name]'s guardian [name] to inform him of [name]'s interest in the [name] program. This Probation officer informed [name] that because it is the weekend, a formal referral cannot be made at this time. This Probation officer provided [name] with the phone number to the care coordinator who would need to be contacted to initiate services for [name] and [name]. [name] noted he would follow-up on Monday during normal business hours. At this time, no other needs addressed or requested. Case closed to this CSW.   [name], MSW, LCSW-A Clinical Social Worker  [facility]  Office: [phone]

## 2016-03-18 NOTE — Discharge Instructions (Signed)
Vaginal Delivery, Care After °Refer to this sheet in the next few weeks. These instructions provide you with information about caring for yourself after vaginal delivery. Your health care provider may also give you more specific instructions. Your treatment has been planned according to current medical practices, but problems sometimes occur. Call your health care provider if you have any problems or questions. °What can I expect after the procedure? °After vaginal delivery, it is common to have: °· Some bleeding from your vagina. °· Soreness in your abdomen, your vagina, and the area of skin between your vaginal opening and your anus (perineum). °· Pelvic cramps. °· Fatigue. °Follow these instructions at home: °Medicines °· Take over-the-counter and prescription medicines only as told by your health care provider. °· If you were prescribed an antibiotic medicine, take it as told by your health care provider. Do not stop taking the antibiotic until it is finished. °Driving °· Do not drive or operate heavy machinery while taking prescription pain medicine. °· Do not drive for 24 hours if you received a sedative. °Lifestyle °· Do not drink alcohol. This is especially important if you are breastfeeding or taking medicine to relieve pain. °· Do not use tobacco products, including cigarettes, chewing tobacco, or e-cigarettes. If you need help quitting, ask your health care provider. °Eating and drinking °· Drink at least 8 eight-ounce glasses of water every day unless you are told not to by your health care provider. If you choose to breastfeed your baby, you may need to drink more water than this. °· Eat high-fiber foods every day. These foods may help prevent or relieve constipation. High-fiber foods include: °¨ Whole grain cereals and breads. °¨ Brown rice. °¨ Beans. °¨ Fresh fruits and vegetables. °Activity °· Return to your normal activities as told by your health care provider. Ask your health care provider what  activities are safe for you. °· Rest as much as possible. Try to rest or take a nap when your baby is sleeping. °· Do not lift anything that is heavier than your baby or 10 lb (4.5 kg) until your health care provider says that it is safe. °· Talk with your health care provider about when you can engage in sexual activity. This may depend on your: °¨ Risk of infection. °¨ Rate of healing. °¨ Comfort and desire to engage in sexual activity. °Vaginal Care °· If you have an episiotomy or a vaginal tear, check the area every day for signs of infection. Check for: °¨ More redness, swelling, or pain. °¨ More fluid or blood. °¨ Warmth. °¨ Pus or a bad smell. °· Do not use tampons or douches until your health care provider says this is safe. °· Watch for any blood clots that may pass from your vagina. These may look like clumps of dark red, brown, or black discharge. °General instructions °· Keep your perineum clean and dry as told by your health care provider. °· Wear loose, comfortable clothing. °· Wipe from front to back when you use the toilet. °· Ask your health care provider if you can shower or take a bath. If you had an episiotomy or a perineal tear during labor and delivery, your health care provider may tell you not to take baths for a certain length of time. °· Wear a bra that supports your breasts and fits you well. °· If possible, have someone help you with household activities and help care for your baby for at least a few days after you leave   the hospital. °· Keep all follow-up visits for you and your baby as told by your health care provider. This is important. °Contact a health care provider if: °· You have: °¨ Vaginal discharge that has a bad smell. °¨ Difficulty urinating. °¨ Pain when urinating. °¨ A sudden increase or decrease in the frequency of your bowel movements. °¨ More redness, swelling, or pain around your episiotomy or vaginal tear. °¨ More fluid or blood coming from your episiotomy or vaginal  tear. °¨ Pus or a bad smell coming from your episiotomy or vaginal tear. °¨ A fever. °¨ A rash. °¨ Little or no interest in activities you used to enjoy. °¨ Questions about caring for yourself or your baby. °· Your episiotomy or vaginal tear feels warm to the touch. °· Your episiotomy or vaginal tear is separating or does not appear to be healing. °· Your breasts are painful, hard, or turn red. °· You feel unusually sad or worried. °· You feel nauseous or you vomit. °· You pass large blood clots from your vagina. If you pass a blood clot from your vagina, save it to show to your health care provider. Do not flush blood clots down the toilet without having your health care provider look at them. °· You urinate more than usual. °· You are dizzy or light-headed. °· You have not breastfed at all and you have not had a menstrual period for 12 weeks after delivery. °· You have stopped breastfeeding and you have not had a menstrual period for 12 weeks after you stopped breastfeeding. °Get help right away if: °· You have: °¨ Pain that does not go away or does not get better with medicine. °¨ Chest pain. °¨ Difficulty breathing. °¨ Blurred vision or spots in your vision. °¨ Thoughts about hurting yourself or your baby. °· You develop pain in your abdomen or in one of your legs. °· You develop a severe headache. °· You faint. °· You bleed from your vagina so much that you fill two sanitary pads in one hour. °This information is not intended to replace advice given to you by your health care provider. Make sure you discuss any questions you have with your health care provider. °Document Released: 01/20/2000 Document Revised: 07/06/2015 Document Reviewed: 02/06/2015 °Elsevier Interactive Patient Education © 2017 [employer] Inc[employer] ° °

## 2016-03-19 ENCOUNTER — Telehealth: Payer: Self-pay | Admitting: Neurology

## 2016-03-19 NOTE — Telephone Encounter (Signed)
Pt called said she feels levETIRAcetam (KEPPRA) 750 MG tablet needs to be lowered now that she has given birth 08/13/16. Please call

## 2016-03-19 NOTE — Telephone Encounter (Signed)
I have spoken with [name] this afternoon and given appt. with RAS tomorrow am/fim

## 2016-03-20 ENCOUNTER — Ambulatory Visit: Payer: Self-pay | Admitting: Neurology

## 2016-03-20 ENCOUNTER — Inpatient Hospital Stay (HOSPITAL_COMMUNITY): Admission: RE | Admit: 2016-03-20 | Payer: Medicaid Other | Source: Ambulatory Visit

## 2016-03-23 ENCOUNTER — Telehealth: Payer: Self-pay | Admitting: *Deleted

## 2016-03-23 NOTE — Telephone Encounter (Signed)
Message left on voice mail from [place] with [facility]. She was speaking very fast and the connection was in and out. She wants orders for patient. Please return call.

## 2016-03-26 ENCOUNTER — Encounter: Payer: Self-pay | Admitting: Neurology

## 2016-03-26 ENCOUNTER — Ambulatory Visit (INDEPENDENT_AMBULATORY_CARE_PROVIDER_SITE_OTHER): Payer: PPO | Admitting: Neurology

## 2016-03-26 VITALS — BP 123/69 | HR 70 | Resp 18 | Ht 65.0 in | Wt 193.0 lb

## 2016-03-26 DIAGNOSIS — G40909 Epilepsy, unspecified, not intractable, without status epilepticus: Secondary | ICD-10-CM

## 2016-03-26 DIAGNOSIS — G43709 Chronic migraine without aura, not intractable, without status migrainosus: Secondary | ICD-10-CM | POA: Diagnosis not present

## 2016-03-26 DIAGNOSIS — F250 Schizoaffective disorder, bipolar type: Secondary | ICD-10-CM

## 2016-03-26 DIAGNOSIS — IMO0002 Reserved for concepts with insufficient information to code with codable children: Secondary | ICD-10-CM

## 2016-03-26 MED ORDER — LEVETIRACETAM 1000 MG PO TABS
1000.0000 mg | ORAL_TABLET | Freq: Two times a day (BID) | ORAL | 11 refills | Status: DC
Start: 2016-03-26 — End: 2016-06-12

## 2016-03-26 NOTE — Progress Notes (Signed)
y    [name]  PATIENT: [name] DOB: 09/30/1990  REFERRING DOCTOR OR PCP:   She sees [facility] for psychiatry (phone:    [phone]) SOURCE: Patient, husband, records in EMR  _________________________________   HISTORICAL  CHIEF COMPLAINT:  Chief Complaint  Patient presents with  . Seizures    Recently gave birth to a dtr.  Denies sz. activity.  Would like to discuss decreasing Keppra/fim    HISTORY OF PRESENT ILLNESS:  [name] is a 26 year old woman with schizoaffective disorder who has had multiple spells of altered consciousness (+/- jerking) and frequent, migraine headaches.     She just delivered a daughter 10 days ago and the delivery went well.   The daughter will be living with her mother-in-law as both her and her husband have significant psychiatric disorders.   Seizures:   She has had no seizures the past 3 months and would like to be on a ower dose.   Her last episodes were 12/16/2015 when she had 4 episodes with possible loss of consciousness. Her dose of Keppra was increased from 1500-20-50 at that time and she has had no further seizures on the higher dose.    Headache:  She has a long history of migraine headaches. These are also doing better with the Keppra.    She still gets a couple of weeks with a milder than they used to be.  When present, pain is bifrontal and throbbing.       She denies  nausea o vomiting.   She has mild photophobia and phonophobia.  Moving usually worsens the pain.     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  She has a long history of psychiatric issues. In the past she has been diagnosed with bipolar disease and with schizoaffective disease.  She is currently on Risperdal.   She was on  Abilify for schizoaffective disease and Prazosin for PTSD.  She also reports a lot of anxiety.      REVIEW OF SYSTEMS: Constitutional: No fevers, chills, sweats, or change in appetite.   Poor sleep Eyes: No visual  changes, double vision, eye pain Ear, nose and throat: No hearing loss, ear pain, nasal congestion, sore throat Cardiovascular: No chest pain, palpitations Respiratory: No shortness of breath at rest or with exertion.   No wheezes GastrointestinaI: No nausea, vomiting, diarrhea, abdominal pain, fecal incontinence Genitourinary: No dysuria, urinary retention or frequency.  No nocturia. Musculoskeletal: Some neck pain and back pain Integumentary: No rash, pruritus, skin lesions Neurological: as above Psychiatric: see above Endocrine: No palpitations, diaphoresis, change in appetite, change in weigh or increased thirst Hematologic/Lymphatic: No anemia, purpura, petechiae. Allergic/Immunologic: No itchy/runny eyes, nasal congestion, recent allergic reactions, rashes  ALLERGIES: Allergies  Allergen Reactions  . Bee Venom Anaphylaxis  . Coconut Flavor Anaphylaxis  . Geodon [Ziprasidone Hcl] Other (See Comments)    Pt states that this medication causes paralysis of the mouth.    . Haloperidol And Related Other (See Comments)    Pt states that this medication causes paralysis of the mouth.    . Lithium Other (See Comments)    Reaction:  Seizure-like activity.    . Oxycodone Other (See Comments)    Reaction:  Hallucinations   . Seroquel [Quetiapine Fumarate] Other (See Comments)    Pt states that this medication is too strong.   . Shellfish Allergy Anaphylaxis  . Percocet [Oxycodone-Acetaminophen] Other (See Comments)    Reaction:  Hallucinations   .  Phenergan [Promethazine Hcl] Other (See Comments)    Reaction:  Chest pain    . Prilosec [Omeprazole] Nausea And Vomiting  . Tegretol [Carbamazepine] Nausea And Vomiting    HOME MEDICATIONS:  Current Outpatient Prescriptions:  .  acetaminophen (TYLENOL) 500 MG tablet, Take 1,000 mg by mouth every 6 (six) hours as needed for mild pain, moderate pain or headache., Disp: , Rfl:  .  albuterol (PROVENTIL HFA;VENTOLIN HFA) 108 (90 Base)  MCG/ACT inhaler, Inhale 1-2 puffs into the lungs every 6 (six) hours as needed for wheezing or shortness of breath., Disp: , Rfl:  .  calcium carbonate (TUMS - DOSED IN MG ELEMENTAL CALCIUM) 500 MG chewable tablet, Chew 2 tablets by mouth as needed for indigestion or heartburn. , Disp: , Rfl:  .  docusate sodium (COLACE) 100 MG capsule, Take 200 mg by mouth daily. , Disp: , Rfl:  .  Ergocalciferol (VITAMIN D2) 400 units TABS, Take 800 Units by mouth daily., Disp: , Rfl:  .  fluticasone-salmeterol (ADVAIR HFA) 115-21 MCG/ACT inhaler, Inhale 2 puffs into the lungs 2 (two) times daily., Disp: 1 Inhaler, Rfl: 5 .  ibuprofen (ADVIL,MOTRIN) 600 MG tablet, Take 1 tablet (600 mg total) by mouth every 6 (six) hours., Disp: 30 tablet, Rfl: 0 .  levETIRAcetam (KEPPRA) 1000 MG tablet, Take 1 tablet (1,000 mg total) by mouth 2 (two) times daily., Disp: 60 tablet, Rfl: 11 .  Prenatal Vit-Fe Fumarate-FA (PRENATAL MULTIVITAMIN) TABS tablet, Take 1 tablet by mouth daily at 12 noon., Disp: , Rfl:  .  risperiDONE (RISPERDAL) 2 MG tablet, Take 2 mg by mouth 2 (two) times daily. , Disp: , Rfl: 0 .  senna (SENOKOT) 8.6 MG tablet, Take 2 tablets by mouth at bedtime. , Disp: , Rfl:  .  sertraline (ZOLOFT) 50 MG tablet, Take 50 mg by mouth daily., Disp: , Rfl:   PAST MEDICAL HISTORY: Past Medical History:  Diagnosis Date  . Acid reflux   . Asthma    last attack 03/13/15 or 03/14/15  . Autism   . 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 Chronic constipation   . Depression   . Drug-seeking behavior   . Essential tremor   . Personality disorder   . Schizo-affective psychosis (HCC)   . Schizoaffective disorder, bipolar type (HCC)   . Seizures (HCC)    Last seizure December 2017    PAST SURGICAL HISTORY: Past Surgical History:  Procedure Laterality Date  . MOUTH SURGERY  2009 or 2010    FAMILY HISTORY: Family History  Problem Relation Age of Onset  . Mental illness Father   . Asthma Father   . PDD Brother   .  Seizures Brother     SOCIAL HISTORY:  Social History   Social History  . Marital status: Married    Spouse name: N/A  . Number of children: 0  . Years of education: N/A   Occupational History  . disability    Social History Main Topics  . Smoking status: Former Smoker    Packs/day: 0.00  . Smokeless tobacco: Never Used     Comment: Smoked for 2  years age [age]  . Alcohol use No  . Drug use: No     Comment: History of cocaine use at age 57 for 4 months  . Sexual activity: Yes    Birth control/ protection: None   Other Topics Concern  . Not on file   Social History Narrative   Marital status: married; guardian is  Jonte Joe      Children: none      Lives: with husband in apartment      Employment:  Disability      Tobacco: quit smoking; smoked for two years.      Alcohol ;none      Drugs: none             PHYSICAL EXAM  Vitals:   03/26/16 1130  BP: 123/69  Pulse: 70  Resp: 18  Weight: 193 lb (87.5 kg)  Height: 5\' 5"  (1.651 m)    Body mass index is 32.12 kg/m.   General: The patient is well-developed well-nourished Pregnant woman in no acute distress  Musculoskeletal:  Neck is nontender   Neurologic Exam  Mental status: The patient is alert and oriented x 3 at the time of the examination. The patient has apparent normal recent and remote memory, with an apparently normal attention span and concentration ability.   Speech is normal.  Cranial nerves: Extraocular movements are full.   .Facial strength is normal.  Trapezius and sternocleidomastoid strength is normal. No dysarthria is noted.  The tongue is midline, and the patient has symmetric elevation of the soft palate. No obvious hearing deficits are noted.  Motor:  Muscle bulk is normal.   Tone is normal. Strength is  5 / 5 in all 4 extremities.   Sensory: Sensory testing is intact to touch and vibration sensation in arms and legs.  Coordination: Cerebellar testing reveals good finger-nose-finger  bilaterally.  Gait and station: Station is normal.   Gait is normal. Tandem gait is slightly wide. Romberg is negative.   Reflexes: Deep tendon reflexes are symmetric and normal bilaterally.        DIAGNOSTIC DATA (LABS, IMAGING, TESTING) - I reviewed patient records, labs, notes, testing and imaging myself where available.  Lab Results  Component Value Date   WBC 14.6 (H) 03/16/2016   HGB 12.1 03/16/2016   HCT 35.7 (L) 03/16/2016   MCV 83.8 03/16/2016   PLT 196 03/16/2016      Component Value Date/Time   NA 135 12/16/2015 2005   NA 140 05/31/2015 1241   K 3.3 (L) 12/16/2015 2005   CL 109 12/16/2015 2005   CO2 19 (L) 12/16/2015 2005   GLUCOSE 108 (H) 12/16/2015 2005   BUN <5 (L) 12/16/2015 2005   BUN 6 05/31/2015 1241   CREATININE 0.49 12/16/2015 2005   CREATININE 0.60 05/05/2015 1051   CALCIUM 9.0 12/16/2015 2005   PROT 5.9 (L) 12/16/2015 2005   PROT 7.3 05/31/2015 1241   ALBUMIN 3.0 (L) 12/16/2015 2005   ALBUMIN 4.7 05/31/2015 1241   AST 19 12/16/2015 2005   ALT 18 12/16/2015 2005   ALKPHOS 39 12/16/2015 2005   BILITOT 0.1 (L) 12/16/2015 2005   BILITOT 0.3 05/31/2015 1241   GFRNONAA >60 12/16/2015 2005   GFRAA >60 12/16/2015 2005    Lab Results  Component Value Date   TSH 1.20 05/05/2015       ASSESSMENT AND PLAN  Seizure disorder (HCC)  Chronic migraine  Schizoaffective disorder, bipolar type by history(HCC)    1.   Change Keppra to 1000 mg po bid to make dosing easier for her.      If she has more spells, Increase to 1000 mg po tid and consider addition of an additional agent. 2.   She needs to make sure she gets at least 7 hours of sleep as sleep deprivation can also lower the seizure threshold  3.   She will return to see [name] in 6 months or call sooner if new or worsening neurologic symptoms.     [name] A. Epimenio Foot, MD, PhD 03/26/2016, 12:54 PM Certified in Neurology, Clinical Neurophysiology, Sleep Medicine, Pain Medicine and  Neuroimaging  [facility] [address] [place], [place] [address] [phone]

## 2016-03-28 ENCOUNTER — Telehealth: Payer: Self-pay | Admitting: Neurology

## 2016-03-28 ENCOUNTER — Inpatient Hospital Stay (HOSPITAL_COMMUNITY)
Admission: AD | Admit: 2016-03-28 | Discharge: 2016-03-28 | Disposition: A | Discharge status: Home / Self Care | Payer: PPO | Source: Ambulatory Visit | Attending: Obstetrics & Gynecology | Admitting: Obstetrics & Gynecology

## 2016-03-28 DIAGNOSIS — G44209 Tension-type headache, unspecified, not intractable: Secondary | ICD-10-CM

## 2016-03-28 DIAGNOSIS — R51 Headache: Secondary | ICD-10-CM | POA: Insufficient documentation

## 2016-03-28 DIAGNOSIS — O9089 Other complications of the puerperium, not elsewhere classified: Secondary | ICD-10-CM | POA: Diagnosis not present

## 2016-03-28 DIAGNOSIS — Z87891 Personal history of nicotine dependence: Secondary | ICD-10-CM | POA: Diagnosis not present

## 2016-03-28 LAB — CBC
HEMATOCRIT: 33.9 % — AB (ref 36.0–46.0)
HEMOGLOBIN: 11.3 g/dL — AB (ref 12.0–15.0)
MCH: 28 pg (ref 26.0–34.0)
MCHC: 33.3 g/dL (ref 30.0–36.0)
MCV: 84.1 fL (ref 78.0–100.0)
Platelets: 281 10*3/uL (ref 150–400)
RBC: 4.03 MIL/uL (ref 3.87–5.11)
RDW: 13.6 % (ref 11.5–15.5)
WBC: 8.2 10*3/uL (ref 4.0–10.5)

## 2016-03-28 LAB — URINALYSIS, ROUTINE W REFLEX MICROSCOPIC
Bacteria, UA: NONE SEEN
Bilirubin Urine: NEGATIVE
GLUCOSE, UA: NEGATIVE mg/dL
Ketones, ur: NEGATIVE mg/dL
Nitrite: NEGATIVE
PH: 5 (ref 5.0–8.0)
PROTEIN: 30 mg/dL — AB
Specific Gravity, Urine: 1.027 (ref 1.005–1.030)

## 2016-03-28 LAB — COMPREHENSIVE METABOLIC PANEL
ALBUMIN: 3.6 g/dL (ref 3.5–5.0)
ALK PHOS: 66 U/L (ref 38–126)
ALT: 26 U/L (ref 14–54)
ANION GAP: 7 (ref 5–15)
AST: 22 U/L (ref 15–41)
BUN: 11 mg/dL (ref 6–20)
CALCIUM: 9 mg/dL (ref 8.9–10.3)
CO2: 25 mmol/L (ref 22–32)
Chloride: 105 mmol/L (ref 101–111)
Creatinine, Ser: 0.66 mg/dL (ref 0.44–1.00)
GFR calc Af Amer: >60 mL/min (ref >60)
GFR calc non Af Amer: >60 mL/min (ref >60)
GLUCOSE: 91 mg/dL (ref 65–99)
POTASSIUM: 3.8 mmol/L (ref 3.5–5.1)
SODIUM: 137 mmol/L (ref 135–145)
Total Bilirubin: 0.3 mg/dL (ref 0.3–1.2)
Total Protein: 7 g/dL (ref 6.5–8.1)

## 2016-03-28 MED ORDER — CYCLOBENZAPRINE HCL 10 MG PO TABS
10.0000 mg | ORAL_TABLET | Freq: Once | ORAL | Status: AC
  Administered 2016-03-28: 10 mg via ORAL
  Filled 2016-03-28: qty 1

## 2016-03-28 MED ORDER — CYCLOBENZAPRINE HCL 10 MG PO TABS: 10.0000 mg | ORAL_TABLET | Freq: Two times a day (BID) | ORAL | 0 refills | Status: DC | PRN

## 2016-03-28 NOTE — MAU Note (Signed)
Pt had vaginal delivery on Feb 9, denies any hypertension during her pregnancy or labor.

## 2016-03-28 NOTE — Telephone Encounter (Signed)
LMTC./fim 

## 2016-03-28 NOTE — Telephone Encounter (Signed)
Patient is returning your call.  

## 2016-03-28 NOTE — MAU Note (Signed)
Ongoing headache for past 5 days. Been taking Tylenol and Ibuprofen, not helping at all.  Has been getting worse. denies epigastric pain, visual changes or increased swelling today.  Is not light sensative.

## 2016-03-28 NOTE — Telephone Encounter (Signed)
Pt request appt. Said she's had HA for 5 days. Said she has not rec'd medication from pill pak so it is not a medication change HA. Please call

## 2016-03-28 NOTE — Discharge Instructions (Signed)
Tension Headache A tension headache is pain, pressure, or aching that is felt over the front and sides of your head. These headaches can last from 30 minutes to several days. Follow these instructions at home: Managing pain  Take over-the-counter and prescription medicines only as told by your doctor.  Lie down in a dark, quiet room when you have a headache.  If directed, apply ice to your head and neck area:  Put ice in a plastic bag.  Place a towel between your skin and the bag.  Leave the ice on for 20 minutes, 2-3 times per day.  Use a heating pad or a hot shower to apply heat to your head and neck area as told by your doctor. Eating and drinking  Eat meals on a regular schedule.  Do not drink a lot of alcohol.  Do not use a lot of caffeine, or stop using caffeine. General instructions  Keep all follow-up visits as told by your doctor. This is important.  Keep a journal to find out if certain things bring on headaches. For example, write down:  What you eat and drink.  How much sleep you get.  Any change to your diet or medicines.  Try getting a massage, or doing other things that help you to relax.  Lessen stress.  Sit up straight. Do not tighten (tense) your muscles.  Do not use tobacco products. This includes cigarettes, chewing tobacco, or e-cigarettes. If you need help quitting, ask your doctor.  Exercise regularly as told by your doctor.  Get enough sleep. This may mean 7-9 hours of sleep. Contact a doctor if:  Your symptoms are not helped by medicine.  You have a headache that feels different from your usual headache.  You feel sick to your stomach (nauseous) or you throw up (vomit).  You have a fever. Get help right away if:  Your headache becomes very bad.  You keep throwing up.  You have a stiff neck.  You have trouble seeing.  You have trouble speaking.  You have pain in your eye or ear.  Your muscles are weak or you lose muscle  control.  You lose your balance or you have trouble walking.  You feel like you will pass out (faint) or you pass out.  You have confusion. This information is not intended to replace advice given to you by your health care provider. Make sure you discuss any questions you have with your health care provider. Document Released: 04/18/2009 Document Revised: 09/22/2015 Document Reviewed: 05/17/2014 Elsevier Interactive Patient Education  2017 [employer]vier Inc.  

## 2016-03-28 NOTE — MAU Provider Note (Signed)
History   [id number]   Chief Complaint  Patient presents with  . Headache    HPI [name] is a 26 y.o. female G2P1011 here with report of a headache x 5 days.  Denies photophobia or vision changes.  Denies any problems during pregnancy related to blood pressures.  No report of epigastric pain or vision changes. Reports normal bleeding since delivering 12 days ago.     No LMP recorded.  OB History  Gravida Para Term Preterm AB Living  2 1 1   1 1   SAB TAB Ectopic Multiple Live Births  1     0 1    # Outcome Date GA Lbr Len/2nd Weight Sex Delivery Anes PTL Lv  2 Term 03/16/16 [redacted]w[redacted]d 09:17 / 01:23 7 lb 12.7 oz (3.535 kg) F Vag-Spont EPI  LIV  1 SAB 2010              Past Medical History:  Diagnosis Date  . Acid reflux   . Asthma    last attack 03/13/15 or 03/14/15  . Autism   . 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 Chronic constipation   . Depression   . Drug-seeking behavior   . Essential tremor   . Personality disorder   . Schizo-affective psychosis ([place])   . Schizoaffective disorder, bipolar type ([place])   . Seizures [facility])    Last seizure December 2017    Family History  Problem Relation Age of Onset  . Mental illness Father   . Asthma Father   . PDD Brother   . Seizures Brother     Social History   Social History  . Marital status: Married    Spouse name: N/A  . Number of children: 0  . Years of education: N/A   Occupational History  . disability    Social History Main Topics  . Smoking status: Former Smoker    Packs/day: 0.00  . Smokeless tobacco: Never Used     Comment: Smoked for 2  years age [age]  . Alcohol use No  . Drug use: No     Comment: History of cocaine use at age 1 for 4 months  . Sexual activity: Yes    Birth control/ protection: None   Other Topics Concern  . Not on file   Social History Narrative   Marital status: married; guardian is Furniture conservator/restorer      Children: none      Lives: with husband in apartment      Employment:   Disability      Tobacco: quit smoking; smoked for two years.      Alcohol ;none      Drugs: none            Allergies  Allergen Reactions  . Bee Venom Anaphylaxis  . Coconut Flavor Anaphylaxis  . Geodon [Ziprasidone Hcl] Other (See Comments)    Pt states that this medication causes paralysis of the mouth.    . Haloperidol And Related Other (See Comments)    Pt states that this medication causes paralysis of the mouth.    . Lithium Other (See Comments)    Reaction:  Seizure-like activity.    . Oxycodone Other (See Comments)    Reaction:  Hallucinations   . Seroquel [Quetiapine Fumarate] Other (See Comments)    Pt states that this medication is too strong.   . Shellfish Allergy Anaphylaxis  . Percocet [Oxycodone-Acetaminophen] Other (See Comments)    Reaction:  Hallucinations   . Phenergan [Promethazine Hcl] Other (See Comments)    Reaction:  Chest pain    . Prilosec [Omeprazole] Nausea And Vomiting  . Tegretol [Carbamazepine] Nausea And Vomiting    No current facility-administered medications on file prior to encounter.    Current Outpatient Prescriptions on File Prior to Encounter  Medication Sig Dispense Refill  . acetaminophen (TYLENOL) 500 MG tablet Take 1,000 mg by mouth every 6 (six) hours as needed for mild pain, moderate pain or headache.    . calcium carbonate (TUMS - DOSED IN MG ELEMENTAL CALCIUM) 500 MG chewable tablet Chew 2 tablets by mouth as needed for indigestion or heartburn.     . docusate sodium (COLACE) 100 MG capsule Take 200 mg by mouth daily.     . Ergocalciferol (VITAMIN D2) 400 units TABS Take 800 Units by mouth daily.    . fluticasone-salmeterol (ADVAIR HFA) 115-21 MCG/ACT inhaler Inhale 2 puffs into the lungs 2 (two) times daily. 1 Inhaler 5  . ibuprofen (ADVIL,MOTRIN) 600 MG tablet Take 1 tablet (600 mg total) by mouth every 6 (six) hours. 30 tablet 0  . Prenatal Vit-Fe Fumarate-FA (PRENATAL MULTIVITAMIN) TABS tablet Take 1 tablet by mouth at  bedtime.     . risperiDONE (RISPERDAL) 2 MG tablet Take 2 mg by mouth 2 (two) times daily.   0  . senna (SENOKOT) 8.6 MG tablet Take 2 tablets by mouth at bedtime.     . sertraline (ZOLOFT) 50 MG tablet Take 50 mg by mouth daily.    Marland Kitchen albuterol (PROVENTIL HFA;VENTOLIN HFA) 108 (90 Base) MCG/ACT inhaler Inhale 1-2 puffs into the lungs every 6 (six) hours as needed for wheezing or shortness of breath.    . levETIRAcetam (KEPPRA) 1000 MG tablet Take 1 tablet (1,000 mg total) by mouth 2 (two) times daily. (Patient taking differently: Take 1,000 mg by mouth 2 (two) times daily. Pt currently has been taking 750mg  three times a day.  Last dose 03/28/16. Will start the 1000mg  on 04/03/16) 60 tablet 11     Review of Systems  Constitutional: Negative for fever.  Eyes: Negative for photophobia and visual disturbance.  Gastrointestinal: Negative for abdominal pain, nausea and vomiting.  Genitourinary: Positive for vaginal bleeding ("little"). Negative for pelvic pain.  Neurological: Positive for headaches. Negative for light-headedness.     Physical Exam   Vitals:   03/28/16 1139  BP: 119/72  Pulse: 90  Resp: 18  Temp: 99 F (37.2 C)  TempSrc: Oral  SpO2: 99%  Weight: 190 lb (86.2 kg)  Height: 5' 2.99" (1.6 m)    Physical Exam  Constitutional: She is oriented to person, place, and time. She appears well-developed and well-nourished. No distress.  HENT:  Head: Normocephalic.  Eyes: Pupils are equal, round, and reactive to light.  Neck: Normal range of motion. Neck supple.  Cardiovascular: Normal rate and regular rhythm.   Respiratory: Effort normal and breath sounds normal.  GI: Soft. There is no tenderness.  Genitourinary: No bleeding in the vagina.  Musculoskeletal: Normal range of motion. She exhibits edema.  Trace pedal edema  Neurological: She is alert and oriented to person, place, and time. She has normal reflexes. She displays normal reflexes.  Skin: Skin is warm and dry.     MAU Course  Procedures  MDM CBC, CMP Flexeril 10 mg PO  1250 report given to Tomi Bamberger who assumes care of patient  Gwen Pounds, CNM  Assessment and Plan

## 2016-03-29 MED ORDER — MELOXICAM 15 MG PO TABS: ORAL_TABLET | ORAL | 0 refills | Status: DC

## 2016-03-29 NOTE — Telephone Encounter (Signed)
Pt returned RN's call °

## 2016-03-29 NOTE — Addendum Note (Signed)
Addended by: France Ravens I on: 03/29/2016 09:24 AM   Modules accepted: Orders

## 2016-03-29 NOTE — Telephone Encounter (Signed)
I have spoken with [name] this morning and per RAS, offered Meloxicam 15mg  po daily prn h/a; not to be used with any other NSAID.  She verbalized understanding of same, is agreeable with this plan.  Rx. escribed to [facility] per her request/fim

## 2016-04-02 ENCOUNTER — Institutional Professional Consult (permissible substitution): Payer: Self-pay

## 2016-04-03 ENCOUNTER — Ambulatory Visit (INDEPENDENT_AMBULATORY_CARE_PROVIDER_SITE_OTHER): Payer: PPO | Admitting: Clinical

## 2016-04-03 DIAGNOSIS — F4323 Adjustment disorder with mixed anxiety and depressed mood: Secondary | ICD-10-CM

## 2016-04-03 NOTE — BH Specialist Note (Addendum)
Session Start time: 3:20   End Time: 3:50 Total Time:  30 minutes Type of Service: Behavioral Health - Individual/Family Interpreter: No.   Interpreter Name & Language: n/a # [facility] Visits July 2017-June 2018: 1st  SUBJECTIVE: [name] is a 26 y.o. female  Pt. was referred by Dr Harolyn Rutherford for:  depression. Pt. reports the following symptoms/concerns: Pt states that she is does not know why she has been crying more than usual; that it helps to be more aware of postpartum depression. Pt says it helps to have an ACT Team, and her husband, to talk to, and is open to additional coping strategies.  Duration of problem:  About two weeks Severity: mild Previous treatment: Currently in treatment with [facility]  OBJECTIVE: Mood: Appropriate & Affect: Appropriate Risk of harm to self or others: No known risk of harm to self or others today Assessments administered: PHQ9: 16/ GAD7: 19  LIFE CONTEXT:  Family & Social: Lives with husband, mother-in-law and father-in-law School/ Work: disability Self-Care: ACT Team, takes [employer], talks to husband Life changes: Recent childbirth What is important to pt/family (values): Healthy baby  GOALS ADDRESSED:  -Reduce symptoms of anxiety and depression  INTERVENTIONS: Motivational Interviewing   ASSESSMENT:  Pt currently experiencing Adjustment disorder with mixed anxious and depressed mood.  Pt may benefit from psychoeducation and brief therapeutic intervention regarding coping with symptoms of anxiety and depression, specifically related to recent childbirth, along with continuing care with ACT Team providers.  PLAN: 1. F/U with behavioral health clinician: Only as needed 2. Behavioral Health meds: Risperdal, Zoloft 3. Behavioral recommendations:  -Continue receiving [place] treatment via [facility] -Consider playing favorite music daily, as long as it remains helpful -Consider reading educational material regarding preventing postpartum  depression, and coping with symptoms of anxiety and depression 4. Referral: Brief Counseling/Psychotherapy and Psychoeducation 5. From scale of 1-10, how likely are you to follow plan: 8  Woc-Behavioral Health Clinician  Behavioral Health Clinician  Warmhandoff: no  Depression screen [facility] 2/9 04/03/2016 03/14/2016 03/08/2016 03/01/2016 02/21/2016  Decreased Interest 0 0 2 1 0  Down, Depressed, Hopeless 3 2 2 1 1   PHQ - 2 Score 3 2 4 2 1   Altered sleeping 3 2 3 3 2   Tired, decreased energy 3 2 2 1  0  Change in appetite 3 0 0 0 0  Feeling bad or failure about yourself  0 0 0 0 0  Trouble concentrating 3 2 3 1  0  Moving slowly or fidgety/restless 1 0 1 0 0  Suicidal thoughts 0 0 0 0 0  PHQ-9 Score 16 8 13 7 3   Some recent data might be hidden   GAD 7 : Generalized Anxiety Score 04/03/2016 03/14/2016 03/08/2016 03/01/2016  Nervous, Anxious, on Edge 3 3 1  0  Control/stop worrying 3 3 2 2   Worry too much - different things 3 3 2 2   Trouble relaxing 3 2 2  0  Restless 1 1 0 0  Easily annoyed or irritable 3 0 0 1  Afraid - awful might happen 3 1 2  0  Total GAD 7 Score 19 13 9  5

## 2016-04-04 NOTE — Telephone Encounter (Signed)
Patient was seen in our office yesterday.  

## 2016-04-19 ENCOUNTER — Ambulatory Visit (INDEPENDENT_AMBULATORY_CARE_PROVIDER_SITE_OTHER): Payer: Medicaid Other | Admitting: Obstetrics and Gynecology

## 2016-04-19 NOTE — BH Specialist Note (Signed)
Pt confirms she is still receiving services via her ACT team, including daily home visits, after loss of pt's husband over 2 weeks prior. Ms. Macleod does not wish to speak further about it today, but says she will take information about hospice [facility], to share with husband's family.

## 2016-04-19 NOTE — Progress Notes (Signed)
Subjective:     [name] is a 26 y.o. female who presents for a postpartum visit. She is six weeks postpartum following a vaginal on 03/16/2016. I have fully reviewed the prenatal and intrapartum course. The delivery was at 40 gestational weeks. Outcome: vaginal. Anesthesia: Epidural. Postpartum course has been uncomplicated. Baby's course has been uncomplicated. Baby is feeding by bottle.  Patient does not have custody of her child. The paternal grandmother has custody. She is living with them. Her husband and father of the baby did recently (in the last 2 weeks) for  Bleeding not at this time. Bowel function Is normal . Bladder function is normal. Patient is not  sexually active. Contraception method is not at this time. Postpartum depression screening: negative  Review of Systems Pertinent items are noted in HPI.   Objective:    BP 135/74   Pulse 82   Wt 180 lb (81.6 kg)   BMI 31.89 kg/m   General:  alert, cooperative and appears depressed   Breasts:  not preformed no complaints  Lungs: clear to auscultation bilaterally  Heart:  regular rate and rhythm, S1, S2 normal, no murmur, click, rub or gallop  Abdomen: soft, non-tender; bowel sounds normal; no masses,  no organomegaly   Vulva:  not evaluated  Vagina: not evaluated  Cervix:  not evaluated  Corpus: not examined  Adnexa:  not evaluated  Rectal Exam: Not performed.        Assessment:     Normal postpartum exam. Pap smear not done at today's visit.  patient declined exam  Plan:    1. Contraception: none husband recently deceased 2.  pateint with adjustment disrder and depression following birth of her child and hisband death. Referred to [facility]. Has an ACT team. 3. Follow up in: 1 year or as needed.

## 2016-05-04 ENCOUNTER — Telehealth: Payer: Self-pay | Admitting: Neurology

## 2016-05-04 NOTE — Telephone Encounter (Signed)
Pt called said she is having severe migraines, said the mobic did not work. Please call

## 2016-05-04 NOTE — Telephone Encounter (Signed)
Per RAS, ok for Imitrex 50mg  #9, one po at onset of h/a.  **She should not repeat dose in 2 hrs.**  Max 1 tablet in 24 hours, and no more than 2-3 tablets per week.  I have tried contacting [facility], but received message that "due to network difficulties, your call can't be completed at this time."/fim

## 2016-05-08 ENCOUNTER — Emergency Department (HOSPITAL_COMMUNITY): Payer: PPO

## 2016-05-08 ENCOUNTER — Emergency Department (HOSPITAL_COMMUNITY)
Admission: EM | Admit: 2016-05-08 | Discharge: 2016-05-08 | Disposition: A | Discharge status: Home / Self Care | Payer: PPO | Attending: Dermatology | Admitting: Dermatology

## 2016-05-08 ENCOUNTER — Encounter (HOSPITAL_COMMUNITY): Payer: Self-pay | Admitting: Emergency Medicine

## 2016-05-08 DIAGNOSIS — Z5321 Procedure and treatment not carried out due to patient leaving prior to being seen by health care provider: Secondary | ICD-10-CM | POA: Diagnosis not present

## 2016-05-08 DIAGNOSIS — R071 Chest pain on breathing: Secondary | ICD-10-CM | POA: Insufficient documentation

## 2016-05-08 DIAGNOSIS — R079 Chest pain, unspecified: Secondary | ICD-10-CM | POA: Diagnosis not present

## 2016-05-08 LAB — BASIC METABOLIC PANEL
Anion gap: 10 (ref 5–15)
BUN: 12 mg/dL (ref 6–20)
CO2: 25 mmol/L (ref 22–32)
Calcium: 9.7 mg/dL (ref 8.9–10.3)
Chloride: 103 mmol/L (ref 101–111)
Creatinine, Ser: 0.72 mg/dL (ref 0.44–1.00)
GFR calc Af Amer: >60 mL/min (ref >60)
GLUCOSE: 88 mg/dL (ref 65–99)
POTASSIUM: 3.7 mmol/L (ref 3.5–5.1)
Sodium: 138 mmol/L (ref 135–145)

## 2016-05-08 LAB — CBC
HEMATOCRIT: 40.5 % (ref 36.0–46.0)
Hemoglobin: 13.1 g/dL (ref 12.0–15.0)
MCH: 27 pg (ref 26.0–34.0)
MCHC: 32.3 g/dL (ref 30.0–36.0)
MCV: 83.3 fL (ref 78.0–100.0)
Platelets: 252 10*3/uL (ref 150–400)
RBC: 4.86 MIL/uL (ref 3.87–5.11)
RDW: 13.2 % (ref 11.5–15.5)
WBC: 10.2 10*3/uL (ref 4.0–10.5)

## 2016-05-08 LAB — I-STAT TROPONIN, ED: Troponin i, poc: 0 ng/mL (ref 0.00–0.08)

## 2016-05-08 NOTE — ED Triage Notes (Signed)
Pt sts left sided CP x 1 hour worse with inspiration; pt is 2 months post partum

## 2016-05-08 NOTE — ED Notes (Signed)
No answer in waiting room when name called.

## 2016-05-08 NOTE — ED Notes (Signed)
Patient called for vitals, no answer.  

## 2016-05-09 ENCOUNTER — Ambulatory Visit (INDEPENDENT_AMBULATORY_CARE_PROVIDER_SITE_OTHER): Payer: PPO | Admitting: Neurology

## 2016-05-09 ENCOUNTER — Encounter: Payer: Self-pay | Admitting: Neurology

## 2016-05-09 DIAGNOSIS — F250 Schizoaffective disorder, bipolar type: Secondary | ICD-10-CM

## 2016-05-09 DIAGNOSIS — G43709 Chronic migraine without aura, not intractable, without status migrainosus: Secondary | ICD-10-CM | POA: Diagnosis not present

## 2016-05-09 DIAGNOSIS — IMO0002 Reserved for concepts with insufficient information to code with codable children: Secondary | ICD-10-CM

## 2016-05-09 DIAGNOSIS — G40909 Epilepsy, unspecified, not intractable, without status epilepticus: Secondary | ICD-10-CM

## 2016-05-09 MED ORDER — TOPIRAMATE 100 MG PO TABS: ORAL_TABLET | ORAL | 11 refills | Status: DC

## 2016-05-09 NOTE — Progress Notes (Signed)
[name]  PATIENT: [name] DOB: 1990-10-11  REFERRING DOCTOR OR PCP:   She sees [facility] for psychiatry (phone:    [phone]) SOURCE: Patient,, records in EMR  _________________________________   HISTORICAL  CHIEF COMPLAINT:  Chief Complaint  Patient presents with  . Migraines    Denies sz. and sts. is compliant with Keppra. Sts. h/a's are more frequent, more severe over the last 3 weeks.  Sts. her husband passed away (suicide) about 4 weeks ago.  Pt. denies depression, SI/HI and is followed by psych..  Sts. she is not able to identify triggers for h/a's.  They come at random times during the day./fim  . Seizures    HISTORY OF PRESENT ILLNESS:  [name] is a 26 year old woman with schizoaffective disorder who has had multiple spells of altered consciousness (+/- jerking) and frequent, migraine headaches.     Social:   She delivered a daughter 03/16/2016 and the delivery went well.   The daughter will be living with her mother-in-law and has custody.    Her husband committed suicide 04/2016.  Seizures:   She has had no seizures the past 3 months.   Her last episodes were 12/16/2015 when she had 4 episodes with possible loss of consciousness. Her dose of Keppra is now 1000 mg po bid.  She has no seizures on that dose since about 5 months ago.  Marland Kitchen    Headache:  She is experiencing more headaches the past month.    They are bifrontal.  Pain is throbbing.      She denies  nausea or vomiting.   She has mild photophobia and phonophobia.  Moving usually worsens the pain.   She has a long history of migraine headaches.     She still gets a couple of weeks with a milder than they used to be.  Keppra seemed to help at first.   Meloxicam has not helped.    She is on Keppra.     Psych:   She sees [facility] [employer] [facility]. She sees the ACT team 2-3 times a week.    She has a long history of psychiatric issues. In the past she has been diagnosed with bipolar  disease and with schizoaffective disease.  She is currently on Risperdal and zoloft with benefit.  She has been diagnosed with schizoaffective disease and PTSD.  She also reports a lot of anxiety.      REVIEW OF SYSTEMS: Constitutional: No fevers, chills, sweats, or change in appetite.   Poor sleep Eyes: No visual changes, double vision, eye pain Ear, nose and throat: No hearing loss, ear pain, nasal congestion, sore throat Cardiovascular: No chest pain, palpitations Respiratory: No shortness of breath at rest or with exertion.   No wheezes GastrointestinaI: No nausea, vomiting, diarrhea, abdominal pain, fecal incontinence Genitourinary: No dysuria, urinary retention or frequency.  No nocturia. Musculoskeletal: Some neck pain and back pain Integumentary: No rash, pruritus, skin lesions Neurological: as above Psychiatric: see above Endocrine: No palpitations, diaphoresis, change in appetite, change in weigh or increased thirst Hematologic/Lymphatic: No anemia, purpura, petechiae. Allergic/Immunologic: No itchy/runny eyes, nasal congestion, recent allergic reactions, rashes  ALLERGIES: Allergies  Allergen Reactions  . Bee Venom Anaphylaxis  . Coconut Flavor Anaphylaxis  . Geodon [Ziprasidone Hcl] Other (See Comments)    Pt states that this medication causes paralysis of the mouth.    . Haloperidol And Related Other (See Comments)    Pt states that this medication causes paralysis  of the mouth.    . Lithium Other (See Comments)    Reaction:  Seizure-like activity.    . Oxycodone Other (See Comments)    Reaction:  Hallucinations   . Seroquel [Quetiapine Fumarate] Other (See Comments)    Pt states that this medication is too strong.   . Shellfish Allergy Anaphylaxis  . Percocet [Oxycodone-Acetaminophen] Other (See Comments)    Reaction:  Hallucinations   . Phenergan [Promethazine Hcl] Other (See Comments)    Reaction:  Chest pain    . Prilosec [Omeprazole] Nausea And Vomiting    . Tegretol [Carbamazepine] Nausea And Vomiting    HOME MEDICATIONS:  Current Outpatient Prescriptions:  .  acetaminophen (TYLENOL) 500 MG tablet, Take 1,000 mg by mouth every 6 (six) hours as needed for mild pain, moderate pain or headache., Disp: , Rfl:  .  albuterol (PROVENTIL HFA;VENTOLIN HFA) 108 (90 Base) MCG/ACT inhaler, Inhale 1-2 puffs into the lungs every 6 (six) hours as needed for wheezing or shortness of breath., Disp: , Rfl:  .  Biotin 1000 MCG CHEW, Chew 10,000 mcg by mouth daily., Disp: , Rfl:  .  calcium carbonate (TUMS - DOSED IN MG ELEMENTAL CALCIUM) 500 MG chewable tablet, Chew 2 tablets by mouth as needed for indigestion or heartburn. , Disp: , Rfl:  .  docusate sodium (COLACE) 100 MG capsule, Take 200 mg by mouth daily. , Disp: , Rfl:  .  Ergocalciferol (VITAMIN D2) 400 units TABS, Take 800 Units by mouth daily., Disp: , Rfl:  .  fluticasone-salmeterol (ADVAIR HFA) 115-21 MCG/ACT inhaler, Inhale 2 puffs into the lungs 2 (two) times daily., Disp: 1 Inhaler, Rfl: 5 .  ibuprofen (ADVIL,MOTRIN) 600 MG tablet, Take 1 tablet (600 mg total) by mouth every 6 (six) hours., Disp: 30 tablet, Rfl: 0 .  levETIRAcetam (KEPPRA) 1000 MG tablet, Take 1 tablet (1,000 mg total) by mouth 2 (two) times daily. (Patient taking differently: Take 1,000 mg by mouth 2 (two) times daily. Pt currently has been taking 750mg  three times a day.  Last dose 03/28/16. Will start the 1000mg  on 04/03/16), Disp: 60 tablet, Rfl: 11 .  meloxicam (MOBIC) 15 MG tablet, Take one tablet daily as needed for headache, Disp: 30 tablet, Rfl: 0 .  Prenatal Vit-Fe Fumarate-FA (PRENATAL MULTIVITAMIN) TABS tablet, Take 1 tablet by mouth at bedtime. , Disp: , Rfl:  .  risperiDONE (RISPERDAL) 2 MG tablet, Take 2 mg by mouth 2 (two) times daily. , Disp: , Rfl: 0 .  senna (SENOKOT) 8.6 MG tablet, Take 2 tablets by mouth at bedtime. , Disp: , Rfl:  .  sertraline (ZOLOFT) 100 MG tablet, Take 100 mg by mouth daily., Disp: , Rfl:  .   topiramate (TOPAMAX) 100 MG tablet, 1/2 pill po qhs x one week then one pill po qHS, Disp: 30 tablet, Rfl: 11  PAST MEDICAL HISTORY: Past Medical History:  Diagnosis Date  . Acid reflux   . Asthma    last attack 03/13/15 or 03/14/15  . Autism   . 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 Chronic constipation   . Depression   . Drug-seeking behavior   . Essential tremor   . Personality disorder   . Schizo-affective psychosis ([place])   . Schizoaffective disorder, bipolar type ([place])   . Seizures ([place])    Last seizure December 2017    PAST SURGICAL HISTORY: Past Surgical History:  Procedure Laterality Date  . MOUTH SURGERY  2009 or 2010    FAMILY HISTORY:  Family History  Problem Relation Age of Onset  . Mental illness Father   . Asthma Father   . PDD Brother   . Seizures Brother     SOCIAL HISTORY:  Social History   Social History  . Marital status: Married    Spouse name: N/A  . Number of children: 0  . Years of education: N/A   Occupational History  . disability    Social History Main Topics  . Smoking status: Former Smoker    Packs/day: 0.00  . Smokeless tobacco: Never Used     Comment: Smoked for 2  years age [age]  . Alcohol use No  . Drug use: No     Comment: History of cocaine use at age 56 for 4 months  . Sexual activity: Yes    Birth control/ protection: None   Other Topics Concern  . Not on file   Social History Narrative   Marital status: married; guardian is Furniture conservator/restorer      Children: none      Lives: with husband in apartment      Employment:  Disability      Tobacco: quit smoking; smoked for two years.      Alcohol ;none      Drugs: none             PHYSICAL EXAM  There were no vitals filed for this visit.  There is no height or weight on file to calculate BMI.   General: The patient is well-developed well-nourished woman in no acute distress  Musculoskeletal:  Neck is nontender.  Occiput is nontender.  Neurologic Exam  Mental status:  The patient is alert and oriented x 3 at the time of the examination. The patient has apparent normal recent and remote memory, with an apparently normal attention span and concentration ability.   Speech is normal.  Cranial nerves: Extraocular movements are full.  Facial strength is normal.  Trapezius and sternocleidomastoid strength is normal. No dysarthria is noted.  The tongue is midline, and the patient has symmetric elevation of the soft palate. No obvious hearing deficits are noted.  Motor:  Muscle bulk is normal.   Tone is normal. Strength is  5 / 5 in all 4 extremities.   Sensory: She has normal and symmetric sensation to touch and vibration in the arms and legs.  Coordination: Cerebellar testing reveals good finger-nose-finger bilaterally.  Gait and station: Station is normal.   Her gait is normal. The tandem gait is slightly wide.. Romberg is negative.   Reflexes: Deep tendon reflexes are symmetric and normal bilaterally.        DIAGNOSTIC DATA (LABS, IMAGING, TESTING) - I reviewed patient records, labs, notes, testing and imaging myself where available.  Lab Results  Component Value Date   WBC 10.2 05/08/2016   HGB 13.1 05/08/2016   HCT 40.5 05/08/2016   MCV 83.3 05/08/2016   PLT 252 05/08/2016      Component Value Date/Time   NA 138 05/08/2016 1908   NA 140 05/31/2015 1241   K 3.7 05/08/2016 1908   CL 103 05/08/2016 1908   CO2 25 05/08/2016 1908   GLUCOSE 88 05/08/2016 1908   BUN 12 05/08/2016 1908   BUN 6 05/31/2015 1241   CREATININE 0.72 05/08/2016 1908   CREATININE 0.60 05/05/2015 1051   CALCIUM 9.7 05/08/2016 1908   PROT 7.0 03/28/2016 1226   PROT 7.3 05/31/2015 1241   ALBUMIN 3.6 03/28/2016 1226   ALBUMIN 4.7 05/31/2015  1241   AST 22 03/28/2016 1226   ALT 26 03/28/2016 1226   ALKPHOS 66 03/28/2016 1226   BILITOT 0.3 03/28/2016 1226   BILITOT 0.3 05/31/2015 1241   GFRNONAA >60 05/08/2016 1908   GFRAA >60 05/08/2016 1908   \  Lab Results    Component Value Date   TSH 1.20 05/05/2015       ASSESSMENT AND PLAN  Seizure disorder (HCC)  Chronic migraine  Schizoaffective disorder, bipolar type by history(HCC)    1.   Continue Keppra 1000 mg po bid and add Topamax 100 mg po qHS as this may help headaches as well as seizures.    2.   We discussed that sleep deprivation can worsen seizures and she needs to try to get 7 hours or more every night. 3.   She will return to see [name] in 6 months or call sooner if new or worsening neurologic symptoms.     Gearl Baratta A. Felecia Shelling, MD, PhD 4/0/3524, 81:85 AM Certified in Neurology, Clinical Neurophysiology, Sleep Medicine, Pain Medicine and Neuroimaging  [facility] [address], [place], [place] [address] [phone]

## 2016-05-21 DIAGNOSIS — K219 Gastro-esophageal reflux disease without esophagitis: Secondary | ICD-10-CM | POA: Diagnosis not present

## 2016-05-21 DIAGNOSIS — G40909 Epilepsy, unspecified, not intractable, without status epilepticus: Secondary | ICD-10-CM | POA: Diagnosis not present

## 2016-05-21 DIAGNOSIS — F3130 Bipolar disorder, current episode depressed, mild or moderate severity, unspecified: Secondary | ICD-10-CM | POA: Diagnosis not present

## 2016-05-21 DIAGNOSIS — G43709 Chronic migraine without aura, not intractable, without status migrainosus: Secondary | ICD-10-CM | POA: Diagnosis not present

## 2016-05-21 DIAGNOSIS — F209 Schizophrenia, unspecified: Secondary | ICD-10-CM | POA: Diagnosis not present

## 2016-05-21 DIAGNOSIS — J4540 Moderate persistent asthma, uncomplicated: Secondary | ICD-10-CM | POA: Diagnosis not present

## 2016-06-12 ENCOUNTER — Telehealth: Payer: Self-pay | Admitting: Neurology

## 2016-06-12 MED ORDER — LEVETIRACETAM 750 MG PO TABS: 750.0000 mg | ORAL_TABLET | Freq: Two times a day (BID) | ORAL | 3 refills | Status: DC

## 2016-06-12 NOTE — Telephone Encounter (Signed)
I have spoken with [name].  She sts. that prior to her pregnancy, she was taking Keppra 750mg  bid and sz. were well controlled on this dose.  Now that she is no longer pregnant, she sts. Keppra 1,000mg  bid is causing excessive drowsiness, so she would like to decrease back to 750mg  bid.  Will check with RAS and call her back/fim

## 2016-06-12 NOTE — Telephone Encounter (Signed)
I have spoken with [name] this afternoon and per RAS, advised she may decrease Keppra back to 750mg  bid.  New rx. escribed to [place] per pt's request/fim

## 2016-06-12 NOTE — Telephone Encounter (Signed)
Pt called said since lowering levETIRAcetam (KEPPRA) 1000 MG tablet to 1000 bid she is too sleepy during the day to take care of her child. Please call

## 2016-06-12 NOTE — Addendum Note (Signed)
Addended by: France Ravens I on: 06/12/2016 05:14 PM   Modules accepted: Orders

## 2016-06-13 ENCOUNTER — Ambulatory Visit: Payer: Medicare Other | Admitting: Neurology

## 2016-06-19 ENCOUNTER — Encounter: Payer: Self-pay | Admitting: Neurology

## 2016-08-07 ENCOUNTER — Ambulatory Visit: Payer: PPO | Admitting: Neurology

## 2016-08-09 ENCOUNTER — Ambulatory Visit (INDEPENDENT_AMBULATORY_CARE_PROVIDER_SITE_OTHER): Payer: PPO | Admitting: Obstetrics and Gynecology

## 2016-08-09 ENCOUNTER — Encounter: Payer: Self-pay | Admitting: Obstetrics and Gynecology

## 2016-08-09 DIAGNOSIS — Z3202 Encounter for pregnancy test, result negative: Secondary | ICD-10-CM

## 2016-08-09 DIAGNOSIS — Z30011 Encounter for initial prescription of contraceptive pills: Secondary | ICD-10-CM

## 2016-08-09 DIAGNOSIS — N912 Amenorrhea, unspecified: Secondary | ICD-10-CM

## 2016-08-09 DIAGNOSIS — Z309 Encounter for contraceptive management, unspecified: Secondary | ICD-10-CM | POA: Insufficient documentation

## 2016-08-09 LAB — POCT PREGNANCY, URINE: Preg Test, Ur: NEGATIVE

## 2016-08-09 MED ORDER — LEVONORGEST-ETH ESTRAD 91-DAY 0.15-0.03 MG PO TABS: 1.0000 | ORAL_TABLET | Freq: Every day | ORAL | 4 refills | Status: DC

## 2016-08-09 MED ORDER — PROGESTERONE MICRONIZED 200 MG PO CAPS: 200.0000 mg | ORAL_CAPSULE | Freq: Every day | ORAL | 0 refills | Status: DC

## 2016-08-09 NOTE — Progress Notes (Signed)
States that she hasn't had a period since March 15th.

## 2016-08-09 NOTE — Patient Instructions (Signed)
Hormonal Contraception Information Hormonal contraception is a type of birth control that uses hormones to prevent pregnancy. It usually involves a combination of the hormones estrogen and progesterone or only the hormone progesterone. Hormonal contraception works in these ways:  It thickens the mucus in the cervix, making it harder for sperm to enter the uterus.  It changes the lining of the uterus, making it harder for an egg to implant.  It may stop the ovaries from releasing eggs (ovulation). Some women who take hormonal contraceptives that contain only progesterone may continue to ovulate.  Hormonal contraception cannot prevent sexually transmitted infections (STIs). Pregnancy may still occur. Estrogen and progesterone contraceptives Contraceptives that use a combination of estrogen and progesterone are available in these forms:  Pill. Pills come in different combinations of hormones. They must be taken at the same time each day. Pills can affect your period, causing you to get your period once every three months or not at all.  Patch. The patch must be worn on the lower abdomen for three weeks and then removed on the fourth.  Vaginal ring. The ring is placed in the vagina and left there for three weeks. It is then removed for one week.  Progesterone contraceptives Contraceptives that use progesterone only are available in these forms:  Pill. Pills should be taken every day of the cycle.  Intrauterine device (IUD). This device is inserted into the uterus and removed or replaced every five years or sooner.  Implant. Plastic rods are placed under the skin of the upper arm. They are removed or replaced every three years or sooner.  Injection. The injection is given once every 90 days.  What are the side effects? The side effects of estrogen and progesterone contraceptives include:  Nausea.  Headaches.  Breast tenderness.  Bleeding or spotting between menstrual cycles.  High  blood pressure (rare).  Strokes, heart attacks, or blood clots (rare)  Side effects of progesterone-only contraceptives include:  Nausea.  Headaches.  Breast tenderness.  Unpredictable menstrual bleeding.  High blood pressure (rare).  Talk to your health care provider about what side effects may affect you. Where to find more information:  Ask your health care provider for more information and resources about hormonal contraception.  U.S. Department of Health and Programmer, systems on Women's Health: [place] Questions to ask:  What type of hormonal contraception is right for me?  How long should I plan to use hormonal contraception?  What are the side effects of the hormonal contraception method I choose?  How can I prevent STIs while using hormonal contraception? Contact a health care provider if:  You start taking hormonal contraceptives and you develop persistent or severe side effects. Summary  Estrogen and progesterone are hormones used in many forms of birth control.  Talk to your health care provider about what side effects may affect you.  Hormonal contraception cannot prevent sexually transmitted infections (STIs).  Ask your health care provider for more information and resources about hormonal contraception. This information is not intended to replace advice given to you by your health care provider. Make sure you discuss any questions you have with your health care provider. Document Released: 02/11/2007 Document Revised: 12/23/2015 Document Reviewed: 12/23/2015 Elsevier Interactive Patient Education  2018 [employer]. Secondary Amenorrhea Secondary amenorrhea is the stopping of menstrual flow for 3-6 months in a female who has previously had periods. There are many possible causes. Most of these causes are not serious. Usually, treating the underlying problem causing the  loss of menses will return your periods to normal. What are the  causes? Some common and uncommon causes of not menstruating include:  Malnutrition.  Low blood sugar (hypoglycemia).  Polycystic ovary disease.  Stress or fear.  Breastfeeding.  Hormone imbalance.  Ovarian failure.  Medicines.  Extreme obesity.  Cystic fibrosis.  Low body weight or drastic weight reduction from any cause.  Early menopause.  Removal of ovaries or uterus.  Contraceptives.  Illness.  Long-term (chronic) illnesses.  Cushing syndrome.  Thyroid problems.  Birth control pills, patches, or vaginal rings for birth control.  What increases the risk? You may be at greater risk of secondary amenorrhea if:  You have a family history of this condition.  You have an eating disorder.  You do athletic training.  How is this diagnosed? A diagnosis is made by your health care provider taking a medical history and doing a physical exam. This will include a pelvic exam to check for problems with your reproductive organs. Pregnancy must be ruled out. Often, numerous blood tests are done to measure different hormones in the body. Urine testing may be done. Specialized exams (ultrasound, CT scan, MRI, or hysteroscopy) may have to be done as well as measuring the body mass index (BMI). How is this treated? Treatment depends on the cause of the amenorrhea. If an eating disorder is present, this can be treated with an adequate diet and therapy. Chronic illnesses may improve with treatment of the illness. Amenorrhea may be corrected with medicines, lifestyle changes, or surgery. If the amenorrhea cannot be corrected, it is sometimes possible to create a false menstruation with medicines. Follow these instructions at home:  Maintain a healthy diet.  Manage weight problems.  Exercise regularly but not excessively.  Get adequate sleep.  Manage stress.  Be aware of changes in your menstrual cycle. Keep a record of when your periods occur. Note the date your period  starts, how long it lasts, and any problems. Contact a health care provider if: Your symptoms do not get better with treatment. This information is not intended to replace advice given to you by your health care provider. Make sure you discuss any questions you have with your health care provider. Document Released: 03/05/2006 Document Revised: 06/30/2015 Document Reviewed: 07/10/2012 Elsevier Interactive Patient Education  [employer].

## 2016-08-10 ENCOUNTER — Telehealth: Payer: Self-pay | Admitting: General Practice

## 2016-08-10 DIAGNOSIS — N912 Amenorrhea, unspecified: Secondary | ICD-10-CM

## 2016-08-10 LAB — PROLACTIN: Prolactin: 146.3 ng/mL — ABNORMAL HIGH (ref 4.8–23.3)

## 2016-08-10 LAB — TSH: TSH: 0.904 u[IU]/mL (ref 0.450–4.500)

## 2016-08-10 MED ORDER — PROGESTERONE MICRONIZED 200 MG PO CAPS: 200.0000 mg | ORAL_CAPSULE | Freq: Every day | ORAL | 0 refills | Status: DC

## 2016-08-10 NOTE — Telephone Encounter (Signed)
Patient called and left message stating she has questions about her birth control. Called patient and she states she isn't sure what she is suppose to be taking and when to start the medication. Per chart review, patient was given progesterone to take daily for 10 days to induce a period. Patient is to start OCPs the Sunday following the start of her period. Discussed with patient. Patient verbalized understanding & had no questions

## 2016-08-10 NOTE — Progress Notes (Signed)
Patient ID: FAYTH TREFRY, female   DOB: [birth year], 26 y.o.   MRN: [id number]   Pt presents with c/o no cycle since about a week after her postpartum visit on March 15. That cycle was normal.  She has been under a lot of stress these last few months. Her boyfriend committed suicide. Pt with known psychiatric disorders and seizure disorder. She is receiving behavior healthcare at [facility].  She desires OCP's. Not sexual active.   PE AF VSS Lungs clear Heart RRR Abd soft + BS GU deferred  UPT negative.  A/P Amenorrhea  Suspect amenorrhea is related to psych meds and life stressors. Will check TSH and Prolactin level. Withdraw with Prometrium and then start [place]. U/R/B reviewed with pt. F/U in 6 months or PRN

## 2016-08-20 ENCOUNTER — Ambulatory Visit (INDEPENDENT_AMBULATORY_CARE_PROVIDER_SITE_OTHER): Payer: PPO | Admitting: Obstetrics and Gynecology

## 2016-08-20 VITALS — BP 120/67 | HR 82 | Ht 64.0 in | Wt 198.9 lb

## 2016-08-20 DIAGNOSIS — N912 Amenorrhea, unspecified: Secondary | ICD-10-CM

## 2016-08-20 DIAGNOSIS — D352 Benign neoplasm of pituitary gland: Secondary | ICD-10-CM

## 2016-08-20 DIAGNOSIS — E221 Hyperprolactinemia: Secondary | ICD-10-CM | POA: Insufficient documentation

## 2016-08-20 MED ORDER — CABERGOLINE 0.5 MG PO TABS: 0.2500 mg | ORAL_TABLET | ORAL | 2 refills | Status: DC

## 2016-08-23 NOTE — Progress Notes (Signed)
Pt here today because she has not had a cycle since completing her progesterone. She just completed this past Sat. Pt informed that it could take 7 -14 days after completing the progesterone before she has a cycle.   Discussed lab results as well with pt. Including elevated prolactin and that this could be causing her amenorrhea.  Discussed beginning carbgoline. U/R/B reviewed.  Rx sent to pharmacy.  Pt will keep her scheduled follow up appt.

## 2016-09-04 ENCOUNTER — Encounter: Payer: Self-pay | Admitting: Neurology

## 2016-09-04 ENCOUNTER — Ambulatory Visit (INDEPENDENT_AMBULATORY_CARE_PROVIDER_SITE_OTHER): Payer: PPO | Admitting: Neurology

## 2016-09-04 VITALS — BP 100/76 | HR 78 | Ht 66.0 in | Wt 197.9 lb

## 2016-09-04 DIAGNOSIS — F250 Schizoaffective disorder, bipolar type: Secondary | ICD-10-CM | POA: Diagnosis not present

## 2016-09-04 DIAGNOSIS — G40909 Epilepsy, unspecified, not intractable, without status epilepticus: Secondary | ICD-10-CM | POA: Diagnosis not present

## 2016-09-04 DIAGNOSIS — G43009 Migraine without aura, not intractable, without status migrainosus: Secondary | ICD-10-CM | POA: Diagnosis not present

## 2016-09-04 MED ORDER — LEVETIRACETAM 500 MG PO TABS: 500.0000 mg | ORAL_TABLET | Freq: Two times a day (BID) | ORAL | 3 refills | Status: DC

## 2016-09-04 MED ORDER — TOPIRAMATE 100 MG PO TABS: 100.0000 mg | ORAL_TABLET | Freq: Every day | ORAL | 3 refills | Status: DC

## 2016-09-04 NOTE — Patient Instructions (Signed)
1.  Decrease Keppra to 500mg  twice daily 2.  Continue topamax 100mg  at bedtime 3.  I will contact you about neurocognitive testing 4.  Follow up in 3 months.

## 2016-09-04 NOTE — Progress Notes (Signed)
NEUROLOGY CONSULTATION NOTE  [name] MRN: [id number] DOB: Jun 17, 1990  Referring provider: Dr. Sabra Heck Primary care provider: Dr. Sabra Heck  Reason for consult:  Migraines, seizure disorder  HISTORY OF PRESENT ILLNESS: [name] is a 26 year old right-handed female with schizoaffective disorder, migraine and asthma who presents for migraines and seizure disorder.  She is accompanied by her mother-in-law who supplements history.  MIGRAINES: Onset:  teenager Location:  varies Quality:  pounding Intensity:  10/10 Aura:  no Prodrome:  no Postdrome:  no Associated symptoms:  Photophobia, phonophobia.  Sometimes nausea.  She has not had any new worse headache of her life, waking up from sleep Duration:  All day Frequency:  3 to 5 days per week Frequency of abortive medication: 3 to 5 days per week Triggers/exacerbating factors:  no Relieving factors:  no Activity:  Cannot function.  She was started on topiramate in April and has since only had one migraine that responded quickly (15-20 minutes) to Tylenol.  Past NSAIDS:  ibuprofen Past analgesics:  no Past abortive triptans:  no Past muscle relaxants:  no Past anti-emetic:  promethazine 25mg  Past antihypertensive medications:  metoprolol Past antidepressant medications:  unknown Past anticonvulsant medications:  lamotrigine 50mg  Past vitamins/Herbal/Supplements:  no Other past therapies:  no  Current NSAIDS:  no Current analgesics:  Tylenol Current triptans:  no Current anti-emetic:  no Current muscle relaxants:  no Current anti-anxiolytic:  no Current sleep aide:  no Current Antihypertensive medications:  no Current Antidepressant medications:  sertraline 100mg  Current Anticonvulsant medications:  topiramate 100mg , Keppra 750mg  twice daily Current Vitamins/Herbal/Supplements:  prenatal Current Antihistamines/Decongestants:  no Other therapy:  no Birth Control:  levonorgestrel-ethinyl estradiol,  progesterone Other medication:  risperidone 2mg   Depression/anxiety:  no Sleep hygiene:  good  SEIZURE DISORDER: She had a concussion on 04/15/15 after falling out of bed and hitting her head on the floor.  She reports loss of consciousness for about a minute.  She had nausea and vomiting and went to the ED where CT of head was unremarkable.  She soon developed new onset seizures.  They have been unwitnessed.  She loses consciousness for less than a minute.  She does not bite her tongue or have incontinence.  They were occurring about 2 to 3 times a month.  She initially was on Depakote, which was subsequently changed to [place] when she became pregnant.  She has responded well to [place].  Her last seizure was in December 2017.  After giving birth in February, she had steadily been decreasing her dose of Keppra under the supervision of her previous neurologist (from 750mg  three times daily to 1000mg  twice daily to 750mg  twice daily).   She has no previous history of seizures.  She has history of repeated closed head trauma.  There is no known family history of seizures.  MRI of brain without contrast from 06/01/15 was personally reviewed and was normal. EEG from 07/21/15 was normal.  Current antiepileptic medication:  Keppra 750mg  twice daily, topiramate 100mg  at bedtime Past antiepileptic medication:  Depakote ER 1000mg  daily (helpful but discontinued due to pregnancy)  She has an extensive psychiatric history.  She carries a diagnosis of schizoaffective disorder, Bipolar disorder, schizophrenia and borderline personality disorder.  She has history of prior suicide attempts.  She is treated at [facility].  She is a ward of the state and was unable to keep her baby.  Her husband had passed away and she is under the care of her mother-in-law.  Her mother-in-law would like more extensive testing to narrow down and determine an accurate psychiatric diagnosis.  LABS: 08/09/16: Urine pregnancy test  negative 05/08/16: CBC with WBC 10.2, HGB 13.1, HCT 40.5, PLT 252; BMP with Na 138, K 3.7, Cl 103, CO2 25, glucose 88, BUN 12. Cr 0.72 03/28/16: Hepatic panel with total bili 0.1, ALP 39, AST 19 and ALT 18.  PAST MEDICAL HISTORY: Past Medical History:  Diagnosis Date  . Acid reflux   . Asthma    last attack 03/13/15 or 03/14/15  . Autism   . 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 Chronic constipation   . Depression   . Drug-seeking behavior   . Essential tremor   . Headache   . Personality disorder   . Schizo-affective psychosis ([place])   . Schizoaffective disorder, bipolar type ([place])   . Seizures ([place])    Last seizure December 2017    PAST SURGICAL HISTORY: Past Surgical History:  Procedure Laterality Date  . MOUTH SURGERY  2009 or 2010    MEDICATIONS: Current Outpatient Prescriptions on File Prior to Visit  Medication Sig Dispense Refill  . acetaminophen (TYLENOL) 500 MG tablet Take 1,000 mg by mouth every 6 (six) hours as needed for mild pain, moderate pain or headache.    . albuterol (PROVENTIL HFA;VENTOLIN HFA) 108 (90 Base) MCG/ACT inhaler Inhale 1-2 puffs into the lungs every 6 (six) hours as needed for wheezing or shortness of breath.    . Biotin 1000 MCG CHEW Chew 10,000 mcg by mouth daily.    . cabergoline (DOSTINEX) 0.5 MG tablet Take 0.5 tablets (0.25 mg total) by mouth 2 (two) times a week. 10 tablet 2  . fluticasone-salmeterol (ADVAIR HFA) 115-21 MCG/ACT inhaler Inhale 2 puffs into the lungs 2 (two) times daily. 1 Inhaler 5  . levonorgestrel-ethinyl estradiol (JOLESSA) 0.15-0.03 MG tablet Take 1 tablet by mouth daily. 1 Package 4  . Multiple Vitamin (MULTIVITAMIN) tablet Take 1 tablet by mouth daily.    . risperiDONE (RISPERDAL) 2 MG tablet Take 2 mg by mouth 2 (two) times daily.   0  . senna (SENOKOT) 8.6 MG tablet Take 2 tablets by mouth at bedtime.     . sertraline (ZOLOFT) 100 MG tablet Take 100 mg by mouth daily.     No current facility-administered medications on  file prior to visit.     ALLERGIES: Allergies  Allergen Reactions  . Bee Venom Anaphylaxis  . Coconut Flavor Anaphylaxis  . Geodon [Ziprasidone Hcl] Other (See Comments)    Pt states that this medication causes paralysis of the mouth.    . Haloperidol And Related Other (See Comments)    Pt states that this medication causes paralysis of the mouth.    . Lithium Other (See Comments)    Reaction:  Seizure-like activity.    . Oxycodone Other (See Comments)    Reaction:  Hallucinations   . Seroquel [Quetiapine Fumarate] Other (See Comments)    Pt states that this medication is too strong.   . Shellfish Allergy Anaphylaxis  . Percocet [Oxycodone-Acetaminophen] Other (See Comments)    Reaction:  Hallucinations   . Phenergan [Promethazine Hcl] Other (See Comments)    Reaction:  Chest pain    . Prilosec [Omeprazole] Nausea And Vomiting  . Tegretol [Carbamazepine] Nausea And Vomiting    FAMILY HISTORY: Family History  Problem Relation Age of Onset  . Mental illness Father   . Asthma Father   . PDD Brother   . Seizures Brother  SOCIAL HISTORY: Social History   Social History  . Marital status: Married    Spouse name: N/A  . Number of children: 0  . Years of education: N/A   Occupational History  . disability    Social History Main Topics  . Smoking status: Former Smoker    Packs/day: 0.00  . Smokeless tobacco: Never Used     Comment: Smoked for 2  years age [age]  . Alcohol use No  . Drug use: No     Comment: History of cocaine use at age 79 for 4 months  . Sexual activity: Yes    Birth control/ protection: None   Other Topics Concern  . Not on file   Social History Narrative   Marital status: married; guardian is Furniture conservator/restorer      Children: none      Lives: with husband in apartment      Employment:  Disability      Tobacco: quit smoking; smoked for two years.      Alcohol ;none      Drugs: none            REVIEW OF SYSTEMS: Constitutional: No  fevers, chills, or sweats, no generalized fatigue, change in appetite Eyes: No visual changes, double vision, eye pain Ear, nose and throat: No hearing loss, ear pain, nasal congestion, sore throat Cardiovascular: No chest pain, palpitations Respiratory:  No shortness of breath at rest or with exertion, wheezes GastrointestinaI: No nausea, vomiting, diarrhea, abdominal pain, fecal incontinence Genitourinary:  No dysuria, urinary retention or frequency Musculoskeletal:  No neck pain, back pain Integumentary: No rash, pruritus, skin lesions Neurological: as above Psychiatric: No depression, insomnia, anxiety Endocrine: No palpitations, fatigue, diaphoresis, mood swings, change in appetite, change in weight, increased thirst Hematologic/Lymphatic:  No purpura, petechiae. Allergic/Immunologic: no itchy/runny eyes, nasal congestion, recent allergic reactions, rashes  PHYSICAL EXAM: Vitals:   09/04/16 1436  BP: 100/76  Pulse: 78   General: No acute distress.  Patient appears well-groomed.  Head:  Normocephalic/atraumatic Eyes:  fundi examined but not visualized Neck: supple, no paraspinal tenderness, full range of motion Back: No paraspinal tenderness Heart: regular rate and rhythm Lungs: Clear to auscultation bilaterally. Vascular: No carotid bruits. Neurological Exam: Mental status: alert and oriented to person, place, and time, recent and remote memory intact, fund of knowledge intact, attention and concentration intact, speech fluent and not dysarthric, language intact. Cranial nerves: CN I: not tested CN II: pupils equal, round and reactive to light, visual fields intact CN III, IV, VI:  full range of motion, no nystagmus, no ptosis CN V: facial sensation intact CN VII: upper and lower face symmetric CN VIII: hearing intact CN IX, X: gag intact, uvula midline CN XI: sternocleidomastoid and trapezius muscles intact CN XII: tongue midline Bulk & Tone: normal, no  fasciculations. Motor:  5/5 throughout  Sensation: temperature and vibration sensation intact. Deep Tendon Reflexes:  2+ throughout, toes downgoing.  Finger to nose testing:  Without dysmetria.  Heel to shin:  Without dysmetria.  Gait:  Normal station and stride.  Able to turn and tandem walk. Romberg negative.  IMPRESSION: Migraine without aura Seizure disorder Psychiatric history  PLAN: 1.  We will decrease Keppra to 500mg  twice daily and see how she does. 2.  She will continue topiramate 100mg  at bedtime 3.  I explained to her mother-in-law that her psychiatrist is most specialized to make a psychiatric diagnosis but she would like more concrete testing.  I will contact Dr.  Bailar, our neuropsychologist, to see if neuropsychological testing would be helpful. 4.  Follow up in 3 months.  Thank you for allowing me to take part in the care of this patient.  Metta Clines, DO  CC:  Kathyrn Lass, MD

## 2016-09-19 ENCOUNTER — Ambulatory Visit: Payer: PPO | Admitting: Neurology

## 2016-09-21 DIAGNOSIS — R42 Dizziness and giddiness: Secondary | ICD-10-CM | POA: Diagnosis not present

## 2016-09-21 DIAGNOSIS — R635 Abnormal weight gain: Secondary | ICD-10-CM | POA: Diagnosis not present

## 2016-10-18 ENCOUNTER — Other Ambulatory Visit: Payer: Self-pay | Admitting: Neurology

## 2016-10-18 ENCOUNTER — Other Ambulatory Visit: Payer: Self-pay | Admitting: Student

## 2016-11-22 ENCOUNTER — Ambulatory Visit (INDEPENDENT_AMBULATORY_CARE_PROVIDER_SITE_OTHER): Payer: PPO | Admitting: Obstetrics and Gynecology

## 2016-11-22 ENCOUNTER — Encounter: Payer: Self-pay | Admitting: Obstetrics and Gynecology

## 2016-11-22 VITALS — BP 112/66 | HR 79 | Wt 201.0 lb

## 2016-11-22 DIAGNOSIS — N938 Other specified abnormal uterine and vaginal bleeding: Secondary | ICD-10-CM | POA: Insufficient documentation

## 2016-11-22 DIAGNOSIS — D352 Benign neoplasm of pituitary gland: Secondary | ICD-10-CM | POA: Diagnosis not present

## 2016-11-22 NOTE — Progress Notes (Signed)
Thekla presents for follow up of DUB She had a cycle in September, lasted @ 3 weeks She is currently taking [place] and tolerating well  She is also on cabergoline for prolactinoma. She is tolerating this as well.   She reports no change in her psych meds. Being followed by neurology and [facility]  ROS:  Denies HA Denies CP  Denies SOB Denies Bowel or bladder dysfunction  PE AF VSS Pleasant female in NAD Lungs clear Heart RRR Abd soft + BS no masses  A/P DUB        Prolactinoma  Will continue with present management. Will check Prolactin level and GYN U/S. Follow up in 6 months or as test results dictate

## 2016-11-23 LAB — PROLACTIN: PROLACTIN: 194.7 ng/mL — AB (ref 4.8–23.3)

## 2016-11-29 ENCOUNTER — Ambulatory Visit (HOSPITAL_COMMUNITY): Payer: PPO

## 2016-12-10 ENCOUNTER — Other Ambulatory Visit: Payer: Self-pay | Admitting: Neurology

## 2016-12-13 ENCOUNTER — Ambulatory Visit (INDEPENDENT_AMBULATORY_CARE_PROVIDER_SITE_OTHER): Payer: PPO | Admitting: Neurology

## 2016-12-13 ENCOUNTER — Encounter: Payer: Self-pay | Admitting: Neurology

## 2016-12-13 VITALS — BP 94/68 | HR 93 | Ht 64.0 in | Wt 200.0 lb

## 2016-12-13 DIAGNOSIS — G43709 Chronic migraine without aura, not intractable, without status migrainosus: Secondary | ICD-10-CM

## 2016-12-13 DIAGNOSIS — F250 Schizoaffective disorder, bipolar type: Secondary | ICD-10-CM | POA: Diagnosis not present

## 2016-12-13 DIAGNOSIS — G40909 Epilepsy, unspecified, not intractable, without status epilepticus: Secondary | ICD-10-CM | POA: Diagnosis not present

## 2016-12-13 DIAGNOSIS — G4440 Drug-induced headache, not elsewhere classified, not intractable: Secondary | ICD-10-CM | POA: Diagnosis not present

## 2016-12-13 NOTE — Progress Notes (Signed)
NEUROLOGY FOLLOW UP OFFICE NOTE  [name] [id number]  HISTORY OF PRESENT ILLNESS: [name] is a 26 year old right-handed female with schizoaffective disorder, migraine and asthma who presents for migraines and seizure disorder.  She is accompanied by her mother-in-law who supplements history.   MIGRAINES: Update: Migraines improved but she is having daily holocephalic headaches. Intensity:  Moderate to severe Duration:  1 hour Frequency:  Daily (1 migraine a month) Current NSAIDS:  no Current analgesics:  Tylenol Current triptans:  no Current anti-emetic:  no Current muscle relaxants:  no Current anti-anxiolytic:  no Current sleep aide:  no Current Antihypertensive medications:  no Current Antidepressant medications:  sertraline 100mg  Current Anticonvulsant medications:  topiramate 100mg , Keppra 500mg  twice daily Current Vitamins/Herbal/Supplements:  prenatal Current Antihistamines/Decongestants:  no Other therapy:  no Birth Control:  levonorgestrel-ethinyl estradiol, progesterone Other medication:  risperidone 2mg    Depression/anxiety:  no Sleep hygiene:  Good Exercise:  No  Hydration:  yes  History: Onset:  teenager Location:  varies Quality:  pounding Intensity:  10/10 Aura:  no Prodrome:  no Postdrome:  no Associated symptoms:  Photophobia, phonophobia.  Sometimes nausea.  She has not had any new worse headache of her life, waking up from sleep Duration:  All day Frequency:  3 to 5 days per week Frequency of abortive medication: 3 to 5 days per week Triggers/exacerbating factors:  no Relieving factors:  no Activity:  Cannot function.   Past NSAIDS:  ibuprofen Past analgesics:  no Past abortive triptans:  no Past muscle relaxants:  no Past anti-emetic:  promethazine 25mg  Past antihypertensive medications:  metoprolol Past antidepressant medications:  unknown Past anticonvulsant medications:  lamotrigine 50mg  Past  vitamins/Herbal/Supplements:  no Other past therapies:  no    SEIZURE DISORDER: Update: Last visit, Keppra was decreased to 500mg  twice daily. Current antiepileptic medication:  Keppra 500mg  twice daily, topiramate 100mg  at bedtime  History: She had a concussion on 04/15/15 after falling out of bed and hitting her head on the floor.  She reports loss of consciousness for about a minute.  She had nausea and vomiting and went to the ED where CT of head was unremarkable.  She soon developed new onset seizures.  They have been unwitnessed.  She loses consciousness for less than a minute.  She does not bite her tongue or have incontinence.  They were occurring about 2 to 3 times a month.  She initially was on Depakote, which was subsequently changed to [place] when she became pregnant.  She has responded well to [place].  Her last seizure was in December 2017.  After giving birth in February, she had steadily been decreasing her dose of Keppra under the supervision of her previous neurologist (from 750mg  three times daily to 1000mg  twice daily to 750mg  twice daily).    She has no previous history of seizures.  She has history of repeated closed head trauma.  There is no known family history of seizures.   MRI of brain without contrast from 06/01/15 was personally reviewed and was normal. EEG from 07/21/15 was normal.   Past antiepileptic medication:  Depakote ER 1000mg  daily (helpful but discontinued due to pregnancy)  PSYCHIATRIC HISTORY:  She has an extensive psychiatric history.  She carries a diagnosis of schizoaffective disorder, Bipolar disorder, schizophrenia and borderline personality disorder.  She has history of prior suicide attempts.  She is treated at [facility]  She is a ward of the state and was unable to keep her baby.  Her husband had passed away and she is under the care of her mother-in-law.  Her mother-in-law would like more extensive testing to narrow down and determine an accurate  psychiatric diagnosis.  PAST MEDICAL HISTORY: Past Medical History:  Diagnosis Date  . Acid reflux   . Asthma    last attack 03/13/15 or 03/14/15  . Autism   . 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 Chronic constipation   . Depression   . Drug-seeking behavior   . Essential tremor   . Headache   . Personality disorder ([place])   . Schizo-affective psychosis ([place])   . Schizoaffective disorder, bipolar type ([place])   . Seizures [facility])    Last seizure December 2017    MEDICATIONS: Current Outpatient Medications on File Prior to Visit  Medication Sig Dispense Refill  . acetaminophen (TYLENOL) 500 MG tablet Take 1,000 mg by mouth every 6 (six) hours as needed for mild pain, moderate pain or headache.    . albuterol (PROVENTIL HFA;VENTOLIN HFA) 108 (90 Base) MCG/ACT inhaler Inhale 1-2 puffs into the lungs every 6 (six) hours as needed for wheezing or shortness of breath.    . Biotin 1000 MCG CHEW Chew 10,000 mcg by mouth daily.    . cabergoline (DOSTINEX) 0.5 MG tablet Take 0.5 tablets (0.25 mg total) by mouth 2 (two) times a week. 10 tablet 2  . fluticasone-salmeterol (ADVAIR HFA) 115-21 MCG/ACT inhaler Inhale 2 puffs into the lungs 2 (two) times daily. 1 Inhaler 5  . levETIRAcetam (KEPPRA) 500 MG tablet TAKE 1 TABLET BY MOUTH TWICE A DAY 60 tablet 3  . levonorgestrel-ethinyl estradiol (JOLESSA) 0.15-0.03 MG tablet Take 1 tablet by mouth daily. 1 Package 4  . Multiple Vitamin (MULTIVITAMIN) tablet Take 1 tablet by mouth daily.    . risperiDONE (RISPERDAL) 2 MG tablet Take 2 mg by mouth 2 (two) times daily.   0  . senna (SENOKOT) 8.6 MG tablet Take 2 tablets by mouth at bedtime.     . sertraline (ZOLOFT) 100 MG tablet Take 100 mg by mouth daily.    Marland Kitchen topiramate (TOPAMAX) 100 MG tablet Take 1 tablet (100 mg total) by mouth at bedtime. 30 tablet 3   No current facility-administered medications on file prior to visit.     ALLERGIES: Allergies  Allergen Reactions  . Bee Venom Anaphylaxis  .  Coconut Flavor Anaphylaxis  . Geodon [Ziprasidone Hcl] Other (See Comments)    Pt states that this medication causes paralysis of the mouth.    . Haloperidol And Related Other (See Comments)    Pt states that this medication causes paralysis of the mouth.    . Lithium Other (See Comments)    Reaction:  Seizure-like activity.    . Oxycodone Other (See Comments)    Reaction:  Hallucinations   . Seroquel [Quetiapine Fumarate] Other (See Comments)    Pt states that this medication is too strong.   . Shellfish Allergy Anaphylaxis  . Percocet [Oxycodone-Acetaminophen] Other (See Comments)    Reaction:  Hallucinations   . Phenergan [Promethazine Hcl] Other (See Comments)    Reaction:  Chest pain    . Prilosec [Omeprazole] Nausea And Vomiting  . Tegretol [Carbamazepine] Nausea And Vomiting    FAMILY HISTORY: Family History  Problem Relation Age of Onset  . Mental illness Father   . Asthma Father   . PDD Brother   . Seizures Brother     SOCIAL HISTORY: Social History   Socioeconomic History  . Marital  status: Married    Spouse name: Not on file  . Number of children: 0  . Years of education: Not on file  . Highest education level: Not on file  Social Needs  . Financial resource strain: Not on file  . Food insecurity - worry: Not on file  . Food insecurity - inability: Not on file  . Transportation needs - medical: Not on file  . Transportation needs - non-medical: Not on file  Occupational History  . Occupation: disability  Tobacco Use  . Smoking status: Former Smoker    Packs/day: 0.00  . Smokeless tobacco: Never Used  . Tobacco comment: Smoked for 2  years age [age]  Substance and Sexual Activity  . Alcohol use: No    Alcohol/week: 0.6 oz    Types: 1 Standard drinks or equivalent per week  . Drug use: No    Comment: History of cocaine use at age 37 for 4 months  . Sexual activity: Yes    Birth control/protection: None  Other Topics Concern  . Not on file    Social History Narrative   Marital status: married; guardian is Furniture conservator/restorer      Children: none      Lives: with husband in apartment      Employment:  Disability      Tobacco: quit smoking; smoked for two years.      Alcohol ;none      Drugs: none   Has not traveled outside of the country.         REVIEW OF SYSTEMS: Constitutional: No fevers, chills, or sweats, no generalized fatigue, change in appetite Eyes: No visual changes, double vision, eye pain Ear, nose and throat: No hearing loss, ear pain, nasal congestion, sore throat Cardiovascular: No chest pain, palpitations Respiratory:  No shortness of breath at rest or with exertion, wheezes GastrointestinaI: No nausea, vomiting, diarrhea, abdominal pain, fecal incontinence Genitourinary:  No dysuria, urinary retention or frequency Musculoskeletal:  No neck pain, back pain Integumentary: No rash, pruritus, skin lesions Neurological: as above Psychiatric: No depression, insomnia, anxiety Endocrine: No palpitations, fatigue, diaphoresis, mood swings, change in appetite, change in weight, increased thirst Hematologic/Lymphatic:  No purpura, petechiae. Allergic/Immunologic: no itchy/runny eyes, nasal congestion, recent allergic reactions, rashes  PHYSICAL EXAM: Vitals:   12/13/16 0855  BP: 94/68  Pulse: 93  SpO2: 93%   General: No acute distress.  Patient appears well-groomed.   Head:  Normocephalic/atraumatic Eyes:  Fundi examined but not visualized Neck: supple, no paraspinal tenderness, full range of motion Heart:  Regular rate and rhythm Lungs:  Clear to auscultation bilaterally Back: No paraspinal tenderness Neurological Exam: alert and oriented to person, place, and time. Attention span and concentration intact, recent and remote memory intact, fund of knowledge intact.  Speech fluent and not dysarthric, language intact.  CN II-XII intact. Bulk and tone normal, muscle strength 5/5 throughout.  Sensation to light touch,  temperature and vibration intact.  Deep tendon reflexes 2+ throughout, toes downgoing.  Finger to nose and heel to shin testing intact.  Gait normal, Romberg negative.  IMPRESSION: Migraine complicated by medication overuse Seizure disorder Psychiatric history  PLAN: 1.  Continue topiramate 100mg  daily and Keppra 500mg  twice daily 2.  Limit Tylenol to no more than 2 days out of the week 3.  Lifestyle modification: exercise 4.  Advise to start magnesium citrate, riboflavin, CoQ10 5.  Follow up in 3 months.  Metta Clines, DO  CC: Kathyrn Lass, MD

## 2016-12-13 NOTE — Patient Instructions (Signed)
Migraine Recommendations: 1.  Continue topiramate 100mg  at bedtime and Keppra 500mg  twice daily 2.  Take Tylenol at earliest onset of headache.   3.  Limit use of pain relievers to no more than 2 days out of the week.  These medications include acetaminophen, ibuprofen, triptans and narcotics.  This will help reduce risk of rebound headaches. 4.  Be aware of common food triggers such as processed sweets, processed foods with nitrites (such as deli meat, hot dogs, sausages), foods with MSG, alcohol (such as wine), chocolate, certain cheeses, certain fruits (dried fruits, bananas, some citrus fruit), vinegar, diet soda. 4.  Avoid caffeine 5.  Routine exercise 6.  Proper sleep hygiene 7.  Stay adequately hydrated with water 8.  Keep a headache diary. 9.  Maintain proper stress management. 10.  Do not skip meals. 11.  Consider supplements:  Magnesium citrate 400mg  to 600mg  daily, riboflavin 400mg , Coenzyme Q 10 100mg  three times daily

## 2016-12-18 ENCOUNTER — Encounter: Payer: Self-pay | Admitting: General Practice

## 2016-12-18 ENCOUNTER — Other Ambulatory Visit: Payer: Self-pay | Admitting: General Practice

## 2016-12-18 DIAGNOSIS — G44021 Chronic cluster headache, intractable: Secondary | ICD-10-CM

## 2016-12-18 DIAGNOSIS — R7989 Other specified abnormal findings of blood chemistry: Secondary | ICD-10-CM

## 2016-12-18 DIAGNOSIS — E229 Hyperfunction of pituitary gland, unspecified: Principal | ICD-10-CM

## 2016-12-20 ENCOUNTER — Telehealth: Payer: Self-pay | Admitting: Neurology

## 2016-12-20 NOTE — Telephone Encounter (Signed)
Patient states that she just a had a 2 second seizure and wanted to let us know

## 2016-12-20 NOTE — Telephone Encounter (Signed)
Called Pt to discuss what happened with her seizure and to go over medications she is taking. There was no answer, LM on VM for rtrn call

## 2016-12-24 NOTE — Telephone Encounter (Signed)
Called and spoke with Pt, wanted to check how she was doing, she had not rtrnd my call. Pt states" I am fine, doing good".

## 2016-12-25 ENCOUNTER — Telehealth: Payer: Self-pay | Admitting: General Practice

## 2016-12-25 NOTE — Telephone Encounter (Signed)
Patient's MRI had to be rescheduled to 12/13 @ 945 due to needing to obtain a prior auth. Per chart review, patient has not seen previously sent mychart message regarding this. Called patient, no answer- left message stating we are trying to reach you regarding an appt we have set up for you, please call us back. Will send letter.

## 2016-12-26 ENCOUNTER — Ambulatory Visit (HOSPITAL_COMMUNITY): Admission: RE | Admit: 2016-12-26 | Payer: PPO | Source: Ambulatory Visit

## 2017-01-17 ENCOUNTER — Ambulatory Visit (HOSPITAL_COMMUNITY): Admission: RE | Admit: 2017-01-17 | Payer: PPO | Source: Ambulatory Visit

## 2017-02-14 ENCOUNTER — Telehealth: Payer: Self-pay | Admitting: *Deleted

## 2017-02-14 NOTE — Telephone Encounter (Signed)
[name] left a voice message 02/11/17 pm stating she was pregnant a year ago and wants to see if we can call [facility] to cancel her prenatal vitamins since she doesn't need them anymore. Request a call.

## 2017-02-18 NOTE — Telephone Encounter (Signed)
I located phone number for [facility] and cancelled order for PNV. I Called Shonte back and spoke with her and informed her I had called and cancelled the PNV at [facility].She states thank you, no other issues voiced.

## 2017-02-18 NOTE — Telephone Encounter (Signed)
I called [name] and left a message I am returning her call, please call us with more information including phone number so we can take care of the issue she left [name] a message about.

## 2017-03-26 ENCOUNTER — Ambulatory Visit: Payer: Self-pay | Admitting: Neurology

## 2017-05-03 ENCOUNTER — Ambulatory Visit: Payer: Self-pay | Admitting: Neurology

## 2017-05-03 ENCOUNTER — Telehealth: Payer: Self-pay | Admitting: Neurology

## 2017-05-03 NOTE — Telephone Encounter (Signed)
Patient missed her appt today and was resch to July 10. She will be out of medication and wants to have [name] do refills on the keppra and the  topamax

## 2017-05-06 ENCOUNTER — Other Ambulatory Visit: Payer: Self-pay | Admitting: Neurology

## 2017-05-08 MED ORDER — LEVETIRACETAM 500 MG PO TABS: 500.0000 mg | ORAL_TABLET | Freq: Two times a day (BID) | ORAL | 3 refills | Status: DC

## 2017-05-08 MED ORDER — TOPIRAMATE 100 MG PO TABS: 100.0000 mg | ORAL_TABLET | Freq: Every day | ORAL | 3 refills | Status: DC

## 2017-06-03 ENCOUNTER — Encounter: Payer: Self-pay | Admitting: Neurology

## 2017-06-30 ENCOUNTER — Encounter (HOSPITAL_COMMUNITY): Payer: Self-pay | Admitting: *Deleted

## 2017-06-30 ENCOUNTER — Other Ambulatory Visit: Payer: Self-pay

## 2017-06-30 ENCOUNTER — Emergency Department (HOSPITAL_COMMUNITY): Payer: PPO

## 2017-06-30 ENCOUNTER — Inpatient Hospital Stay (HOSPITAL_COMMUNITY)
Admission: EM | Admit: 2017-06-30 | Discharge: 2017-07-04 | DRG: 439 | Disposition: A | Discharge status: Home / Self Care | Payer: PPO | Attending: Internal Medicine | Admitting: Internal Medicine

## 2017-06-30 DIAGNOSIS — Z91013 Allergy to seafood: Secondary | ICD-10-CM

## 2017-06-30 DIAGNOSIS — Z9102 Food additives allergy status: Secondary | ICD-10-CM | POA: Diagnosis not present

## 2017-06-30 DIAGNOSIS — K5909 Other constipation: Secondary | ICD-10-CM | POA: Diagnosis present

## 2017-06-30 DIAGNOSIS — Z825 Family history of asthma and other chronic lower respiratory diseases: Secondary | ICD-10-CM

## 2017-06-30 DIAGNOSIS — Z885 Allergy status to narcotic agent status: Secondary | ICD-10-CM | POA: Diagnosis not present

## 2017-06-30 DIAGNOSIS — F4310 Post-traumatic stress disorder, unspecified: Secondary | ICD-10-CM | POA: Diagnosis not present

## 2017-06-30 DIAGNOSIS — Z79899 Other long term (current) drug therapy: Secondary | ICD-10-CM

## 2017-06-30 DIAGNOSIS — Z309 Encounter for contraceptive management, unspecified: Secondary | ICD-10-CM

## 2017-06-30 DIAGNOSIS — F250 Schizoaffective disorder, bipolar type: Secondary | ICD-10-CM | POA: Diagnosis not present

## 2017-06-30 DIAGNOSIS — R1110 Vomiting, unspecified: Secondary | ICD-10-CM | POA: Diagnosis not present

## 2017-06-30 DIAGNOSIS — Z91030 Bee allergy status: Secondary | ICD-10-CM

## 2017-06-30 DIAGNOSIS — R748 Abnormal levels of other serum enzymes: Secondary | ICD-10-CM | POA: Diagnosis present

## 2017-06-30 DIAGNOSIS — Z818 Family history of other mental and behavioral disorders: Secondary | ICD-10-CM

## 2017-06-30 DIAGNOSIS — G40909 Epilepsy, unspecified, not intractable, without status epilepticus: Secondary | ICD-10-CM | POA: Diagnosis not present

## 2017-06-30 DIAGNOSIS — Z882 Allergy status to sulfonamides status: Secondary | ICD-10-CM

## 2017-06-30 DIAGNOSIS — R1011 Right upper quadrant pain: Secondary | ICD-10-CM

## 2017-06-30 DIAGNOSIS — G250 Essential tremor: Secondary | ICD-10-CM | POA: Diagnosis present

## 2017-06-30 DIAGNOSIS — K219 Gastro-esophageal reflux disease without esophagitis: Secondary | ICD-10-CM | POA: Diagnosis present

## 2017-06-30 DIAGNOSIS — F609 Personality disorder, unspecified: Secondary | ICD-10-CM | POA: Diagnosis present

## 2017-06-30 DIAGNOSIS — R110 Nausea: Secondary | ICD-10-CM | POA: Diagnosis not present

## 2017-06-30 DIAGNOSIS — F840 Autistic disorder: Secondary | ICD-10-CM | POA: Diagnosis present

## 2017-06-30 DIAGNOSIS — K8500 Idiopathic acute pancreatitis without necrosis or infection: Principal | ICD-10-CM | POA: Diagnosis present

## 2017-06-30 DIAGNOSIS — F329 Major depressive disorder, single episode, unspecified: Secondary | ICD-10-CM | POA: Diagnosis present

## 2017-06-30 DIAGNOSIS — Z148 Genetic carrier of other disease: Secondary | ICD-10-CM | POA: Diagnosis not present

## 2017-06-30 DIAGNOSIS — Z888 Allergy status to other drugs, medicaments and biological substances status: Secondary | ICD-10-CM

## 2017-06-30 DIAGNOSIS — Z765 Malingerer [conscious simulation]: Secondary | ICD-10-CM

## 2017-06-30 DIAGNOSIS — Z87891 Personal history of nicotine dependence: Secondary | ICD-10-CM | POA: Diagnosis not present

## 2017-06-30 DIAGNOSIS — J45909 Unspecified asthma, uncomplicated: Secondary | ICD-10-CM | POA: Diagnosis not present

## 2017-06-30 DIAGNOSIS — E860 Dehydration: Secondary | ICD-10-CM | POA: Diagnosis present

## 2017-06-30 DIAGNOSIS — Z91018 Allergy to other foods: Secondary | ICD-10-CM

## 2017-06-30 DIAGNOSIS — K859 Acute pancreatitis without necrosis or infection, unspecified: Secondary | ICD-10-CM | POA: Diagnosis present

## 2017-06-30 LAB — COMPREHENSIVE METABOLIC PANEL
ALBUMIN: 4.2 g/dL (ref 3.5–5.0)
ALK PHOS: 75 U/L (ref 38–126)
ALT: 17 U/L (ref 14–54)
ANION GAP: 8 (ref 5–15)
AST: 23 U/L (ref 15–41)
BILIRUBIN TOTAL: 0.7 mg/dL (ref 0.3–1.2)
BUN: 12 mg/dL (ref 6–20)
CALCIUM: 9.4 mg/dL (ref 8.9–10.3)
CO2: 21 mmol/L — ABNORMAL LOW (ref 22–32)
Chloride: 110 mmol/L (ref 101–111)
Creatinine, Ser: 0.72 mg/dL (ref 0.44–1.00)
GFR calc Af Amer: >60 mL/min (ref >60)
GLUCOSE: 98 mg/dL (ref 65–99)
POTASSIUM: 4.1 mmol/L (ref 3.5–5.1)
Sodium: 139 mmol/L (ref 135–145)
TOTAL PROTEIN: 7.4 g/dL (ref 6.5–8.1)

## 2017-06-30 LAB — URINALYSIS, ROUTINE W REFLEX MICROSCOPIC
BILIRUBIN URINE: NEGATIVE
GLUCOSE, UA: NEGATIVE mg/dL
Hgb urine dipstick: NEGATIVE
KETONES UR: NEGATIVE mg/dL
NITRITE: NEGATIVE
PH: 5 (ref 5.0–8.0)
PROTEIN: NEGATIVE mg/dL
Specific Gravity, Urine: 1.014 (ref 1.005–1.030)

## 2017-06-30 LAB — CBC
HEMATOCRIT: 42.3 % (ref 36.0–46.0)
HEMOGLOBIN: 13.7 g/dL (ref 12.0–15.0)
MCH: 27.5 pg (ref 26.0–34.0)
MCHC: 32.4 g/dL (ref 30.0–36.0)
MCV: 84.8 fL (ref 78.0–100.0)
Platelets: 205 10*3/uL (ref 150–400)
RBC: 4.99 MIL/uL (ref 3.87–5.11)
RDW: 12.7 % (ref 11.5–15.5)
WBC: 7.7 10*3/uL (ref 4.0–10.5)

## 2017-06-30 LAB — TRIGLYCERIDES: Triglycerides: 75 mg/dL (ref <150)

## 2017-06-30 LAB — LIPASE, BLOOD
Lipase: 2538 U/L — ABNORMAL HIGH (ref 11–51)
Lipase: 2447 U/L — ABNORMAL HIGH (ref 11–51)

## 2017-06-30 LAB — I-STAT BETA HCG BLOOD, ED (MC, WL, AP ONLY): I-stat hCG, quantitative: <5 m[IU]/mL (ref <5)

## 2017-06-30 LAB — PREGNANCY, URINE: Preg Test, Ur: NEGATIVE

## 2017-06-30 MED ORDER — FENTANYL CITRATE (PF) 100 MCG/2ML IJ SOLN
25.0000 ug | Freq: Once | INTRAMUSCULAR | Status: AC
  Administered 2017-06-30: 25 ug via INTRAVENOUS
  Filled 2017-06-30: qty 2

## 2017-06-30 MED ORDER — LORAZEPAM 2 MG/ML IJ SOLN
INTRAMUSCULAR | Status: AC
  Administered 2017-06-30: 2 mg via INTRAVENOUS
  Filled 2017-06-30: qty 1

## 2017-06-30 MED ORDER — HYDROMORPHONE HCL 2 MG/ML IJ SOLN
1.0000 mg | INTRAMUSCULAR | Status: DC | PRN
  Administered 2017-06-30: 1 mg via INTRAVENOUS
  Filled 2017-06-30: qty 1

## 2017-06-30 MED ORDER — IOHEXOL 300 MG/ML  SOLN
100.0000 mL | Freq: Once | INTRAMUSCULAR | Status: AC | PRN
  Administered 2017-06-30: 100 mL via INTRAVENOUS

## 2017-06-30 MED ORDER — SODIUM CHLORIDE 0.9 % IV BOLUS
1000.0000 mL | Freq: Once | INTRAVENOUS | Status: AC
  Administered 2017-06-30: 1000 mL via INTRAVENOUS

## 2017-06-30 MED ORDER — LORAZEPAM 2 MG/ML IJ SOLN
2.0000 mg | INTRAMUSCULAR | Status: DC | PRN
  Administered 2017-06-30 – 2017-07-01 (×2): 2 mg via INTRAVENOUS
  Filled 2017-06-30: qty 1

## 2017-06-30 MED ORDER — DEXTROSE IN LACTATED RINGERS 5 % IV SOLN
INTRAVENOUS | Status: DC
  Administered 2017-06-30 – 2017-07-01 (×2): via INTRAVENOUS

## 2017-06-30 MED ORDER — KETOROLAC TROMETHAMINE 15 MG/ML IJ SOLN: 15.0000 mg | Freq: Once | INTRAMUSCULAR | Status: DC

## 2017-06-30 MED ORDER — ENOXAPARIN SODIUM 40 MG/0.4ML ~~LOC~~ SOLN
40.0000 mg | SUBCUTANEOUS | Status: DC
  Administered 2017-07-01 – 2017-07-03 (×3): 40 mg via SUBCUTANEOUS
  Filled 2017-06-30 (×5): qty 0.4

## 2017-06-30 NOTE — ED Notes (Signed)
Pt ambulatory to hallway bathroom without difficulty.

## 2017-06-30 NOTE — H&P (Signed)
History and Physical    [name] CNO:[id number] DOB: 1991/01/18 DOA: 06/30/2017  Referring MD/NP/PA: Dr Julious Oka  PCP: Evelyn Lass, MD   Outpatient Specialists: None  Patient coming from: Home  Chief Complaint: abdominal pain  HPI: [name] is a 27 y.o. female with medical history significant of bipolar disorder, prolactinoma, seizure disorder as well as schizoaffective disorder who has had dysfunctional uterine bleeding in the past and has been on contraceptive management with OB/GYN. Patient is on multiple psych medications. She presented with abdominal pain and nausea. Pain is centrally located up to 10 out of 10. Denied any diarrhea. Denied any fever or chills. No sick contacts. Patient was evaluated and found to have elevated lipase and clinically appears to have pancreatitis. No history of alcohol abuse. Normal lipid profile and no evidence of gallstones and has scan. She is being admitted primarily to treat acute pancreatitis.  ED Course: vital signs were stable except for blood pressure 89/50 on arrival. Her sodium is 139 potassium 4.1. Lipase is 2538. White count is 7.7 hemoglobin 13.7. Prolactin level 194, CT abdomen and pelvis showed no evidence of pancreatitis. Triglyceride levels are 75  Review of Systems: As per HPI otherwise 10 point review of systems negative.    Past Medical History:  Diagnosis Date  . Acid reflux   . Asthma    last attack 03/13/15 or 03/14/15  . Autism   . 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 Chronic constipation   . Depression   . Drug-seeking behavior   . Essential tremor   . Headache   . Personality disorder ([place])   . Schizo-affective psychosis ([place])   . Schizoaffective disorder, bipolar type ([place])   . Seizures [facility])    Last seizure December 2017    Past Surgical History:  Procedure Laterality Date  . MOUTH SURGERY  2009 or 2010     reports that she has quit smoking. She smoked 0.00 packs per day. She has never used smokeless  tobacco. She reports that she does not drink alcohol or use drugs.  Allergies  Allergen Reactions  . Bee Venom Anaphylaxis  . Coconut Flavor Anaphylaxis  . Coconut Oil Anaphylaxis and Rash  . Geodon [Ziprasidone Hcl] Other (See Comments)    Pt states that this medication causes paralysis of the mouth.    . Haloperidol And Related Other (See Comments)    Pt states that this medication causes paralysis of the mouth.    . Lithium Other (See Comments)    Reaction:  Seizure-like activity.    . Oxycodone Other (See Comments)    Reaction:  Hallucinations   . Quetiapine Fumarate Other (See Comments)    TOO STRONG  . Seroquel [Quetiapine Fumarate] Other (See Comments)    Pt states that this medication is too strong.   . Shellfish Allergy Anaphylaxis  . Percocet [Oxycodone-Acetaminophen] Other (See Comments)    Reaction:  Hallucinations   . Phenergan [Promethazine Hcl] Other (See Comments)    Reaction:  Chest pain    . Prilosec [Omeprazole] Nausea And Vomiting  . Sulfa Antibiotics Other (See Comments)    Chest pain   . Tegretol [Carbamazepine] Nausea And Vomiting    Family History  Problem Relation Age of Onset  . Mental illness Father   . Asthma Father   . PDD Brother   . Seizures Brother     Prior to Admission medications   Medication Sig Start Date End Date Taking? Authorizing Provider  acetaminophen (  TYLENOL) 500 MG tablet Take 1,000 mg by mouth every 6 (six) hours as needed for mild pain, moderate pain or headache.   Yes [provider]  albuterol (PROVENTIL HFA;VENTOLIN HFA) 108 (90 Base) MCG/ACT inhaler Inhale 1-2 puffs into the lungs every 6 (six) hours as needed for wheezing or shortness of breath.   Yes [provider]  fluticasone-salmeterol (ADVAIR HFA) 115-21 MCG/ACT inhaler Inhale 2 puffs into the lungs 2 (two) times daily. 08/03/15  Yes Evelyn Euler, MD  levETIRAcetam (KEPPRA) 500 MG tablet TAKE 1 TABLET BY MOUTH TWICE A DAY 05/06/17  Yes Ward,  Evelyn R, DO  Multiple Vitamin (MULTIVITAMIN) tablet Take 1 tablet by mouth daily.   Yes [provider]  risperiDONE (RISPERDAL) 2 MG tablet Take 2 mg by mouth 2 (two) times daily.    Yes [provider]  sertraline (ZOLOFT) 100 MG tablet Take 100 mg by mouth daily.   Yes [provider]  topiramate (TOPAMAX) 100 MG tablet Take 1 tablet (100 mg total) by mouth at bedtime. 09/04/16  Yes Ward, Evelyn R, DO  cabergoline (DOSTINEX) 0.5 MG tablet Take 0.5 tablets (0.25 mg total) by mouth 2 (two) times a week. Patient not taking: Reported on 06/30/2017 08/20/16   Evelyn Milroy, MD  levETIRAcetam (KEPPRA) 500 MG tablet Take 1 tablet (500 mg total) by mouth 2 (two) times daily. Patient not taking: Reported on 06/30/2017 05/08/17   Evelyn Partridge, DO  levonorgestrel-ethinyl estradiol (JOLESSA) 0.15-0.03 MG tablet Take 1 tablet by mouth daily. Patient not taking: Reported on 06/30/2017 08/09/16   Evelyn Milroy, MD  topiramate (TOPAMAX) 100 MG tablet Take 1 tablet (100 mg total) by mouth at bedtime. Patient not taking: Reported on 06/30/2017 05/08/17   Evelyn Partridge, DO    Physical Exam: Vitals:   06/30/17 1730 06/30/17 1745 06/30/17 1800 06/30/17 1830  BP: (!) 103/54 110/61 109/61 (!) 97/55  Pulse: 72 87 82 80  Resp:    14  Temp:      TempSrc:      SpO2: 98% 100% 99% 100%      Constitutional: NAD, calm, comfortable Vitals:   06/30/17 1730 06/30/17 1745 06/30/17 1800 06/30/17 1830  BP: (!) 103/54 110/61 109/61 (!) 97/55  Pulse: 72 87 82 80  Resp:    14  Temp:      TempSrc:      SpO2: 98% 100% 99% 100%   Eyes: PERRL, lids and conjunctivae normal ENMT: Mucous membranes are moist. Posterior pharynx clear of any exudate or lesions.Normal dentition.  Neck: normal, supple, no masses, no thyromegaly Respiratory: clear to auscultation bilaterally, no wheezing, no crackles. Normal respiratory effort. No accessory muscle use.  Cardiovascular: Regular rate and rhythm, no murmurs  / rubs / gallops. No extremity edema. 2+ pedal pulses. No carotid bruits.  Abdomen: diffuse tenderness, no masses palpated. No hepatosplenomegaly. Bowel sounds positive.  Musculoskeletal: no clubbing / cyanosis. No joint deformity upper and lower extremities. Good ROM, no contractures. Normal muscle tone.  Skin: no rashes, lesions, ulcers. No induration Neurologic: CN 2-12 grossly intact. Sensation intact, DTR normal. Strength 5/5 in all 4.  Psychiatric: Normal judgment and insight. Alert and oriented x 3. Normal mood.    Labs on Admission: I have personally reviewed following labs and imaging studies  CBC: Recent Labs  Lab 06/30/17 1215  WBC 7.7  HGB 13.7  HCT 42.3  MCV 84.8  PLT 073   Basic Metabolic Panel: Recent Labs  Lab  06/30/17 1215  NA 139  K 4.1  CL 110  CO2 21*  GLUCOSE 98  BUN 12  CREATININE 0.72  CALCIUM 9.4   GFR: CrCl cannot be calculated (Unknown ideal weight.). Liver Function Tests: Recent Labs  Lab 06/30/17 1215  AST 23  ALT 17  ALKPHOS 75  BILITOT 0.7  PROT 7.4  ALBUMIN 4.2   Recent Labs  Lab 06/30/17 1215 06/30/17 1726  LIPASE 2,538* 2,447*   No results for input(s): AMMONIA in the last 168 hours. Coagulation Profile: No results for input(s): INR, PROTIME in the last 168 hours. Cardiac Enzymes: No results for input(s): CKTOTAL, CKMB, CKMBINDEX, TROPONINI in the last 168 hours. BNP (last 3 results) No results for input(s): PROBNP in the last 8760 hours. HbA1C: No results for input(s): HGBA1C in the last 72 hours. CBG: No results for input(s): GLUCAP in the last 168 hours. Lipid Profile: Recent Labs    06/30/17 1725  TRIG 75   Thyroid Function Tests: No results for input(s): TSH, T4TOTAL, FREET4, T3FREE, THYROIDAB in the last 72 hours. Anemia Panel: No results for input(s): VITAMINB12, FOLATE, FERRITIN, TIBC, IRON, RETICCTPCT in the last 72 hours. Urine analysis:    Component Value Date/Time   COLORURINE YELLOW 06/30/2017  1152   APPEARANCEUR HAZY (A) 06/30/2017 1152   LABSPEC 1.014 06/30/2017 1152   PHURINE 5.0 06/30/2017 1152   GLUCOSEU NEGATIVE 06/30/2017 1152   HGBUR NEGATIVE 06/30/2017 1152   BILIRUBINUR NEGATIVE 06/30/2017 1152   BILIRUBINUR negative 06/14/2015 0930   KETONESUR NEGATIVE 06/30/2017 1152   PROTEINUR NEGATIVE 06/30/2017 1152   UROBILINOGEN 0.2 10/24/2015 1428   NITRITE NEGATIVE 06/30/2017 1152   LEUKOCYTESUR SMALL (A) 06/30/2017 1152   Sepsis Labs: @LABRCNTIP (procalcitonin:4,lacticidven:4) )No results found for this or any previous visit (from the past 240 hour(s)).   Radiological Exams on Admission: Ct Abdomen Pelvis W Contrast  Result Date: 06/30/2017 CLINICAL DATA:  Epigastric region pain with vomiting EXAM: CT ABDOMEN AND PELVIS WITH CONTRAST TECHNIQUE: Multidetector CT imaging of the abdomen and pelvis was performed using the standard protocol following bolus administration of intravenous contrast. CONTRAST:  117mL OMNIPAQUE IOHEXOL 300 MG/ML  SOLN COMPARISON:  None. FINDINGS: Lower chest: Lung bases are clear. Hepatobiliary: No focal liver lesions are evident. Gallbladder wall is not appreciably thickened. There is no biliary duct dilatation. Pancreas: There is no pancreatic mass or inflammatory focus. Spleen: No splenic lesions are evident. Adrenals/Urinary Tract: Adrenals bilaterally appear normal. Kidneys bilaterally show no evident mass or hydronephrosis on either side. There is no appreciable renal or ureteral calculus demonstrable on either side. Urinary bladder is midline with wall thickness within normal limits. Stomach/Bowel: There is moderate stool in the colon. There is no appreciable bowel wall or mesenteric thickening. There is no evident bowel obstruction. No free air or portal venous air. Vascular/Lymphatic: There is no abdominal aortic aneurysm. No vascular lesions are evident on this study. No adenopathy is appreciable in the abdomen or pelvis. Reproductive: Uterus is  anteverted and slightly canted toward the right. No pelvic mass evident. Other: Appendix is normal in size and contour. There is no appendiceal region inflammation. A small amount of increased attenuation material is noted in the appendix without surrounding thickening or inflammation. There is no ascites or abscess in the abdomen or pelvis. Small ventral hernia is noted containing only fat. Musculoskeletal: There are no blastic or lytic bone lesions. There is no intramuscular or abdominal wall lesions. IMPRESSION: 1. No bowel obstruction. No abscess. No appendiceal region inflammation. 2.  No demonstrable renal or ureteral calculi. Note that intravenous contrast potentially could obscure small calculi in the collecting systems. No hydronephrosis on either side. 3.  Small ventral hernia containing only fat. Electronically Signed   By: Lowella Grip III M.D.   On: 06/30/2017 17:03   US Abdomen Limited Ruq  Result Date: 06/30/2017 CLINICAL DATA:  Right upper quadrant pain EXAM: ULTRASOUND ABDOMEN LIMITED RIGHT UPPER QUADRANT COMPARISON:  None. FINDINGS: Gallbladder: No gallstones or wall thickening visualized. There is no pericholecystic fluid. No sonographic Murphy sign noted by sonographer. Common bile duct: Diameter: 2 mm. No intrahepatic or extrahepatic biliary duct dilatation. Liver: No focal lesion identified. Within normal limits in parenchymal echogenicity. Portal vein is patent on color Doppler imaging with normal direction of blood flow towards the liver. IMPRESSION: Study within normal limits. Electronically Signed   By: Lowella Grip III M.D.   On: 06/30/2017 15:01     Assessment/Plan Principal Problem:   Pancreatitis, acute Active Problems:   Schizoaffective disorder, bipolar type by history(HCC)   PTSD (post-traumatic stress disorder)   Seizure disorder (HCC)   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   Contraceptive management   Acute pancreatitis   #1 acute pancreatitis: Patient has  acute pancreatitis clinically as well as her lipase level. Their is no obvious cause of her pancreatitis. She is not alcoholic. No gallstones or evidence of dilated common bile duct. We will still check right upper quadrant abdominal ultrasound. I suspect this is due to her medications. She also has multiples psychiatric medications and seizure medications. At this point we will admit the patient for bowel rest. Pain management as well as nausea control. Follow lipase levels. Consider GI consult in the morning. Patient also on contraceptive management which could trigger pancreatitis  #2 seizure disorder: Patient will be on Ativan as needed. We will resume her home regimen as well as practicable.  #3 history of PTSD: Continue with Ativan IV.  #4 dehydration: We'll hydrate patient aggressively with Ringer's lactate.    DVT prophylaxis: Lovenox  Code Status: Full Code  Family Communication: Mother in law with patient  Disposition Plan: Home  Consults called: None  Admission status: inpatient  Severity of Illness: The appropriate patient status for this patient is INPATIENT. Inpatient status is judged to be reasonable and necessary in order to provide the required intensity of service to ensure the patient's safety. The patient's presenting symptoms, physical exam findings, and initial radiographic and laboratory data in the context of their chronic comorbidities is felt to place them at high risk for further clinical deterioration. Furthermore, it is not anticipated that the patient will be medically stable for discharge from the hospital within 2 midnights of admission. The following factors support the patient status of inpatient.   " The patient's presenting symptoms include abdominal pain and nausea. " The worrisome physical exam findings include epigastric tenderness. " The initial radiographic and laboratory data are worrisome because of no obvious pancreatitis. " The chronic co-morbidities  include multiple psychiatric medications and seizure medicines.   * I certify that at the point of admission it is my clinical judgment that the patient will require inpatient hospital care spanning beyond 2 midnights from the point of admission due to high intensity of service, high risk for further deterioration and high frequency of surveillance required.Barbette Merino MD [facility] Pager [phone]  If 7PM-7AM, please contact night-coverage [email or web address] Password [email or web address]  06/30/2017, 8:00 PM

## 2017-06-30 NOTE — ED Notes (Signed)
Patient transported to CT 

## 2017-06-30 NOTE — Progress Notes (Signed)
Pt call RN to room and whiles speaking with RN pt states " I had a seizure". When asked when pt said now which was not noted nor witness by RN. MD notified and new order received. Will continue to closely monitor pt. Delia Heady RN

## 2017-06-30 NOTE — ED Provider Notes (Signed)
[place] EMERGENCY DEPARTMENT Provider Note   CSN: [id number] Arrival date & time: 06/30/17  7262     History   Chief Complaint Chief Complaint  Patient presents with  . Abdominal Pain    HPI [name] is a 27 y.o. female.  HPI   Patient is a 27 year old female with past medical history significant for autism, personality disorder, schizoaffective spectrum disorder, seizures, depression.  She is presenting with right upper quadrant pain.  Patient reports plan the last 3 to 4 days.  Got worse 2 days ago.  Has had nausea, no vomiting.  No fevers.  Patient's father and grandmother have history of gallbladder disease.  Normal bowel movements, most recently yesterday.   Past Medical History:  Diagnosis Date  . Acid reflux   . Asthma    last attack 03/13/15 or 03/14/15  . Autism   . 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 Chronic constipation   . Depression   . Drug-seeking behavior   . Essential tremor   . Headache   . Personality disorder ([place])   . Schizo-affective psychosis ([place])   . Schizoaffective disorder, bipolar type ([place])   . Seizures [facility])    Last seizure December 2017    Patient Active Problem List   Diagnosis Date Noted  . DUB (dysfunctional uterine bleeding) 11/22/2016  . Prolactinoma ([place]) 08/20/2016  . Contraceptive management 08/09/2016  . Tachycardia with heart rate 121-140 beats per minute 01/13/2016  . 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  . Seizure disorder ([place]) 08/08/2015  . Chronic migraine 07/27/2015  . Asthma 04/15/2015  . Schizoaffective disorder, bipolar type by history(HCC) 03/10/2014  . Borderline personality disorder ([place]) 03/10/2014  . PTSD (post-traumatic stress disorder) 03/10/2014  . Autism spectrum disorder 06/15/2013    Past Surgical History:  Procedure Laterality Date  . MOUTH SURGERY  2009 or 2010     OB History    Gravida  2   Para  1   Term  1   Preterm      AB  1   Living  1     SAB  1   TAB      Ectopic      Multiple  0   Live Births  1            Home Medications    Prior to Admission medications   Medication Sig Start Date End Date Taking? Authorizing Provider  acetaminophen (TYLENOL) 500 MG tablet Take 1,000 mg by mouth every 6 (six) hours as needed for mild pain, moderate pain or headache.   Yes [provider]  albuterol (PROVENTIL HFA;VENTOLIN HFA) 108 (90 Base) MCG/ACT inhaler Inhale 1-2 puffs into the lungs every 6 (six) hours as needed for wheezing or shortness of breath.   Yes [provider]  fluticasone-salmeterol (ADVAIR HFA) 115-21 MCG/ACT inhaler Inhale 2 puffs into the lungs 2 (two) times daily. 08/03/15  Yes Timmothy Euler, MD  levETIRAcetam (KEPPRA) 500 MG tablet TAKE 1 TABLET BY MOUTH TWICE A DAY 05/06/17  Yes Jaffe, Adam R, DO  Multiple Vitamin (MULTIVITAMIN) tablet Take 1 tablet by mouth daily.   Yes [provider]  risperiDONE (RISPERDAL) 2 MG tablet Take 2 mg by mouth 2 (two) times daily.    Yes [provider]  sertraline (ZOLOFT) 100 MG tablet Take 100 mg by mouth daily.   Yes [provider]  topiramate (TOPAMAX) 100 MG tablet Take 1 tablet (100 mg  total) by mouth at bedtime. 09/04/16  Yes Jaffe, Adam R, DO  cabergoline (DOSTINEX) 0.5 MG tablet Take 0.5 tablets (0.25 mg total) by mouth 2 (two) times a week. Patient not taking: Reported on 06/30/2017 08/20/16   Chancy Milroy, MD  levETIRAcetam (KEPPRA) 500 MG tablet Take 1 tablet (500 mg total) by mouth 2 (two) times daily. Patient not taking: Reported on 06/30/2017 05/08/17   Pieter Partridge, DO  levonorgestrel-ethinyl estradiol (JOLESSA) 0.15-0.03 MG tablet Take 1 tablet by mouth daily. Patient not taking: Reported on 06/30/2017 08/09/16   Chancy Milroy, MD  topiramate (TOPAMAX) 100 MG tablet Take 1 tablet (100 mg total) by mouth at bedtime. Patient not taking: Reported on 06/30/2017 05/08/17   Pieter Partridge, DO    Family History Family  History  Problem Relation Age of Onset  . Mental illness Father   . Asthma Father   . PDD Brother   . Seizures Brother     Social History Social History   Tobacco Use  . Smoking status: Former Smoker    Packs/day: 0.00  . Smokeless tobacco: Never Used  . Tobacco comment: Smoked for 2  years age [age]  Substance Use Topics  . Alcohol use: No    Alcohol/week: 0.6 oz    Types: 1 Standard drinks or equivalent per week  . Drug use: No    Comment: History of cocaine use at age 63 for 4 months     Allergies   Bee venom; Coconut flavor; Coconut oil; Geodon [ziprasidone hcl]; Haloperidol and related; Lithium; Oxycodone; Quetiapine fumarate; Seroquel [quetiapine fumarate]; Shellfish allergy; Percocet [oxycodone-acetaminophen]; Phenergan [promethazine hcl]; Prilosec [omeprazole]; Sulfa antibiotics; and Tegretol [carbamazepine]   Review of Systems Review of Systems  Constitutional: Negative for activity change, fatigue and fever.  Respiratory: Negative for shortness of breath.   Cardiovascular: Negative for chest pain.  Gastrointestinal: Positive for abdominal pain and nausea. Negative for vomiting.  All other systems reviewed and are negative.    Physical Exam Updated Vital Signs BP (!) 97/55   Pulse 80   Temp 97.7 F (36.5 C) (Oral)   Resp 16   LMP 04/30/2017   SpO2 100%   Physical Exam  Constitutional: She is oriented to person, place, and time. She appears well-developed and well-nourished.  HENT:  Head: Normocephalic and atraumatic.  Eyes: Right eye exhibits no discharge.  Cardiovascular: Normal rate, regular rhythm and normal heart sounds.  No murmur heard. Pulmonary/Chest: Effort normal and breath sounds normal. She has no wheezes. She has no rales.  Abdominal: Soft. She exhibits no distension. There is tenderness in the right upper quadrant.  Neurological: She is oriented to person, place, and time.  Skin: Skin is warm and dry. She is not diaphoretic.    Psychiatric:  Odd affect  Nursing note and vitals reviewed.    ED Treatments / Results  Labs (all labs ordered are listed, but only abnormal results are displayed) Labs Reviewed  LIPASE, BLOOD - Abnormal; Notable for the following components:      Result Value   Lipase 2,538 (*)    All other components within normal limits  COMPREHENSIVE METABOLIC PANEL - Abnormal; Notable for the following components:   CO2 21 (*)    All other components within normal limits  URINALYSIS, ROUTINE W REFLEX MICROSCOPIC - Abnormal; Notable for the following components:   APPearance HAZY (*)    Leukocytes, UA SMALL (*)    Bacteria, UA FEW (*)    All other  components within normal limits  LIPASE, BLOOD - Abnormal; Notable for the following components:   Lipase 2,447 (*)    All other components within normal limits  CBC  PREGNANCY, URINE  TRIGLYCERIDES  I-STAT BETA HCG BLOOD, ED ([facility], [facility], [facility] ONLY)    EKG None  Radiology Ct Abdomen Pelvis W Contrast  Result Date: 06/30/2017 CLINICAL DATA:  Epigastric region pain with vomiting EXAM: CT ABDOMEN AND PELVIS WITH CONTRAST TECHNIQUE: Multidetector CT imaging of the abdomen and pelvis was performed using the standard protocol following bolus administration of intravenous contrast. CONTRAST:  165mL OMNIPAQUE IOHEXOL 300 MG/ML  SOLN COMPARISON:  None. FINDINGS: Lower chest: Lung bases are clear. Hepatobiliary: No focal liver lesions are evident. Gallbladder wall is not appreciably thickened. There is no biliary duct dilatation. Pancreas: There is no pancreatic mass or inflammatory focus. Spleen: No splenic lesions are evident. Adrenals/Urinary Tract: Adrenals bilaterally appear normal. Kidneys bilaterally show no evident mass or hydronephrosis on either side. There is no appreciable renal or ureteral calculus demonstrable on either side. Urinary bladder is midline with wall thickness within normal limits. Stomach/Bowel: There is moderate stool in the colon.  There is no appreciable bowel wall or mesenteric thickening. There is no evident bowel obstruction. No free air or portal venous air. Vascular/Lymphatic: There is no abdominal aortic aneurysm. No vascular lesions are evident on this study. No adenopathy is appreciable in the abdomen or pelvis. Reproductive: Uterus is anteverted and slightly canted toward the right. No pelvic mass evident. Other: Appendix is normal in size and contour. There is no appendiceal region inflammation. A small amount of increased attenuation material is noted in the appendix without surrounding thickening or inflammation. There is no ascites or abscess in the abdomen or pelvis. Small ventral hernia is noted containing only fat. Musculoskeletal: There are no blastic or lytic bone lesions. There is no intramuscular or abdominal wall lesions. IMPRESSION: 1. No bowel obstruction. No abscess. No appendiceal region inflammation. 2. No demonstrable renal or ureteral calculi. Note that intravenous contrast potentially could obscure small calculi in the collecting systems. No hydronephrosis on either side. 3.  Small ventral hernia containing only fat. Electronically Signed   By: Lowella Grip III M.D.   On: 06/30/2017 17:03   US Abdomen Limited Ruq  Result Date: 06/30/2017 CLINICAL DATA:  Right upper quadrant pain EXAM: ULTRASOUND ABDOMEN LIMITED RIGHT UPPER QUADRANT COMPARISON:  None. FINDINGS: Gallbladder: No gallstones or wall thickening visualized. There is no pericholecystic fluid. No sonographic Murphy sign noted by sonographer. Common bile duct: Diameter: 2 mm. No intrahepatic or extrahepatic biliary duct dilatation. Liver: No focal lesion identified. Within normal limits in parenchymal echogenicity. Portal vein is patent on color Doppler imaging with normal direction of blood flow towards the liver. IMPRESSION: Study within normal limits. Electronically Signed   By: Lowella Grip III M.D.   On: 06/30/2017 15:01     Procedures Procedures (including critical care time)  Medications Ordered in ED Medications  sodium chloride 0.9 % bolus 1,000 mL (0 mLs Intravenous Stopped 06/30/17 1634)  fentaNYL (SUBLIMAZE) injection 25 mcg (25 mcg Intravenous Given 06/30/17 1715)  iohexol (OMNIPAQUE) 300 MG/ML solution 100 mL (100 mLs Intravenous Contrast Given 06/30/17 1644)  sodium chloride 0.9 % bolus 1,000 mL (0 mLs Intravenous Stopped 06/30/17 1836)     Initial Impression / Assessment and Plan / ED Course  I have reviewed the triage vital signs and the nursing notes.  Pertinent labs & imaging results that were available during my care  of the patient were reviewed by me and considered in my medical decision making (see chart for details).      Patient is a 28 year old female with past medical history significant for autism, personality disorder, schizoaffective spectrum disorder, seizures, depression.  She is presenting with right upper quadrant pain.  Patient reports plan the last 3 to 4 days.  Got worse 2 days ago.  Has had nausea, no vomiting.  No fevers.  Patient's father and grandmother have history of gallbladder disease.  Normal bowel movements, most recently yesterday.   1:05 PM Tenderness and redness noted to right upper quadrant.  Will do right upper quadrant ultrasound.  6:51 PM Patient lipase extremely elevated.  But oddly no evidence of inflammation on CT or ultrasound.  No evidence of gallstone pancreatitis.  Patient is on no medications that are typical for causing pancreatitis.  Reports no scorpion bites, or abnormal intake.  Will repeat.  REpeat still elevated. Will admit.   Final Clinical Impressions(s) / ED Diagnoses   Final diagnoses:  RUQ abdominal pain    ED Discharge Orders    None       Macarthur Critchley, MD 06/30/17 1853

## 2017-06-30 NOTE — ED Triage Notes (Signed)
Pt reports right side pain since Tuesday night. Had episode of nausea but denies vomiting, diarrhea or urinary symptoms.

## 2017-06-30 NOTE — Progress Notes (Signed)
Pt arrived to the unit from ED via stretcher with belongings to the side. Pt A&O x4; IV intact and transfusing; pt oriented to the unit and room; fall safety precaution and prevention education completed. Skin intact with no opened wounds or pressure ulcer noted. VSS; Call light within reach and will continue to closely monitor pt. Francis Gaines Eleri Ruben RN.

## 2017-07-01 DIAGNOSIS — F250 Schizoaffective disorder, bipolar type: Secondary | ICD-10-CM

## 2017-07-01 LAB — CBC
HCT: 35.5 % — ABNORMAL LOW (ref 36.0–46.0)
Hemoglobin: 11.4 g/dL — ABNORMAL LOW (ref 12.0–15.0)
MCH: 27.7 pg (ref 26.0–34.0)
MCHC: 32.1 g/dL (ref 30.0–36.0)
MCV: 86.2 fL (ref 78.0–100.0)
PLATELETS: 183 10*3/uL (ref 150–400)
RBC: 4.12 MIL/uL (ref 3.87–5.11)
RDW: 12.8 % (ref 11.5–15.5)
WBC: 7.3 10*3/uL (ref 4.0–10.5)

## 2017-07-01 LAB — COMPREHENSIVE METABOLIC PANEL
ALT: 16 U/L (ref 14–54)
AST: 19 U/L (ref 15–41)
Albumin: 3.3 g/dL — ABNORMAL LOW (ref 3.5–5.0)
Alkaline Phosphatase: 60 U/L (ref 38–126)
Anion gap: 6 (ref 5–15)
BUN: 6 mg/dL (ref 6–20)
CHLORIDE: 111 mmol/L (ref 101–111)
CO2: 21 mmol/L — ABNORMAL LOW (ref 22–32)
Calcium: 8.5 mg/dL — ABNORMAL LOW (ref 8.9–10.3)
Creatinine, Ser: 0.56 mg/dL (ref 0.44–1.00)
GLUCOSE: 120 mg/dL — AB (ref 65–99)
POTASSIUM: 3.8 mmol/L (ref 3.5–5.1)
Sodium: 138 mmol/L (ref 135–145)
TOTAL PROTEIN: 6.1 g/dL — AB (ref 6.5–8.1)
Total Bilirubin: 0.4 mg/dL (ref 0.3–1.2)

## 2017-07-01 LAB — LIPASE, BLOOD: Lipase: 1456 U/L — ABNORMAL HIGH (ref 11–51)

## 2017-07-01 MED ORDER — SODIUM CHLORIDE 0.9 % IV SOLN
INTRAVENOUS | Status: DC
  Administered 2017-07-01 – 2017-07-03 (×6): via INTRAVENOUS

## 2017-07-01 MED ORDER — ONDANSETRON HCL 4 MG/2ML IJ SOLN
4.0000 mg | Freq: Four times a day (QID) | INTRAMUSCULAR | Status: DC | PRN
  Administered 2017-07-01 – 2017-07-02 (×3): 4 mg via INTRAVENOUS
  Filled 2017-07-01 (×4): qty 2

## 2017-07-01 MED ORDER — LEVETIRACETAM IN NACL 500 MG/100ML IV SOLN
500.0000 mg | Freq: Two times a day (BID) | INTRAVENOUS | Status: DC
Start: 2017-07-02 — End: 2017-07-03
  Administered 2017-07-02 – 2017-07-03 (×4): 500 mg via INTRAVENOUS
  Filled 2017-07-01 (×4): qty 100

## 2017-07-01 MED ORDER — LEVETIRACETAM 500 MG PO TABS
500.0000 mg | ORAL_TABLET | Freq: Two times a day (BID) | ORAL | Status: DC
  Administered 2017-07-01: 500 mg via ORAL
  Filled 2017-07-01: qty 1

## 2017-07-01 MED ORDER — HYDROMORPHONE HCL 1 MG/ML IJ SOLN
1.0000 mg | INTRAMUSCULAR | Status: DC | PRN
  Administered 2017-07-01 (×2): 1 mg via INTRAVENOUS
  Filled 2017-07-01 (×2): qty 1

## 2017-07-01 MED ORDER — HYDROMORPHONE HCL 1 MG/ML IJ SOLN
0.5000 mg | INTRAMUSCULAR | Status: DC | PRN
  Administered 2017-07-01 – 2017-07-02 (×6): 0.5 mg via INTRAVENOUS
  Filled 2017-07-01 (×6): qty 0.5

## 2017-07-01 NOTE — Plan of Care (Signed)
Pt progressing on plan of care.

## 2017-07-01 NOTE — Progress Notes (Signed)
Progress Note    [name]  KWI:[id number] DOB: [birth year]  DOA: 06/30/2017 PCP: Kathyrn Lass, MD    Brief Narrative:    Medical records reviewed and are as summarized below:  [name] is an 27 y.o. female medical history significant of bipolar disorder, prolactinoma, seizure disorder as well as schizoaffective disorder who has had dysfunctional uterine bleeding in the past and has been on contraceptive management with OB/GYN. Patient is on multiple psych medications. She presented with abdominal pain and nausea. Pain is centrally located up to 10 out of 10. Denied any diarrhea. Denied any fever or chills. No sick contacts. Patient was evaluated and found to have elevated lipase and clinically appears to have pancreatitis. No history of alcohol abuse. Normal lipid profile and no evidence of gallstones and has scan. She is being admitted primarily to treat acute pancreatitis.     Assessment/Plan:   Principal Problem:   Pancreatitis, acute Active Problems:   Schizoaffective disorder, bipolar type by history(HCC)   PTSD (post-traumatic stress disorder)   Seizure disorder (HCC)   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   Contraceptive management   Acute pancreatitis  N/V due to acute pancreatitis:  -no obvious cause of her pancreatitis -No gallstones or evidence of dilated common bile duc -?due to her medications. -NPO -?GI consult- for possible autoimmune -NPO except ICE chips  seizure disorder:  -IV keppra   dehydration -IVF  H/o prolactinoma -outpatient follow up needs MRI  Body mass index is 31.86 kg/m.   Family Communication/Anticipated D/C date and plan/Code Status   DVT prophylaxis: Lovenox ordered. Code Status: Full Code.  Family Communication:  Disposition Plan:    Medical Consultants:    None.    Subjective:   Asking for liquids but with profuse vomiting after  Objective:    Vitals:   06/30/17 2321 07/01/17 0344 07/01/17 0812  07/01/17 1214  BP: (!) 100/57 (!) 102/55 123/69 123/72  Pulse: 75 78 90 (!) 101  Resp: 18 18 17    Temp: 97.7 F (36.5 C) 97.7 F (36.5 C)  (!) 97.5 F (36.4 C)  TempSrc: Oral Oral  Oral  SpO2: 99% 98% 98% 99%  Weight:      Height:        Intake/Output Summary (Last 24 hours) at 07/01/2017 1457 Last data filed at 07/01/2017 1100 Gross per 24 hour  Intake 5737.5 ml  Output -  Net 5737.5 ml   Filed Weights   06/30/17 2121  Weight: 84.2 kg (185 lb 10 oz)    Exam: NAD +tenderness in epigastric region Flat affect  Data Reviewed:   I have personally reviewed following labs and imaging studies:  Labs: Labs show the following:   Basic Metabolic Panel: Recent Labs  Lab 06/30/17 1215 07/01/17 0436  NA 139 138  K 4.1 3.8  CL 110 111  CO2 21* 21*  GLUCOSE 98 120*  BUN 12 6  CREATININE 0.72 0.56  CALCIUM 9.4 8.5*   GFR Estimated Creatinine Clearance: 109.9 mL/min (by C-G formula based on SCr of 0.56 mg/dL). Liver Function Tests: Recent Labs  Lab 06/30/17 1215 07/01/17 0436  AST 23 19  ALT 17 16  ALKPHOS 75 60  BILITOT 0.7 0.4  PROT 7.4 6.1*  ALBUMIN 4.2 3.3*   Recent Labs  Lab 06/30/17 1215 06/30/17 1726 07/01/17 0436  LIPASE 2,538* 2,447* 1,456*   No results for input(s): AMMONIA in the last 168 hours. Coagulation profile No results for input(s): INR, PROTIME  in the last 168 hours.  CBC: Recent Labs  Lab 06/30/17 1215 07/01/17 0436  WBC 7.7 7.3  HGB 13.7 11.4*  HCT 42.3 35.5*  MCV 84.8 86.2  PLT 205 183   Cardiac Enzymes: No results for input(s): CKTOTAL, CKMB, CKMBINDEX, TROPONINI in the last 168 hours. BNP (last 3 results) No results for input(s): PROBNP in the last 8760 hours. CBG: No results for input(s): GLUCAP in the last 168 hours. D-Dimer: No results for input(s): DDIMER in the last 72 hours. Hgb A1c: No results for input(s): HGBA1C in the last 72 hours. Lipid Profile: Recent Labs    06/30/17 1725  TRIG 75   Thyroid  function studies: No results for input(s): TSH, T4TOTAL, T3FREE, THYROIDAB in the last 72 hours.  Invalid input(s): FREET3 Anemia work up: No results for input(s): VITAMINB12, FOLATE, FERRITIN, TIBC, IRON, RETICCTPCT in the last 72 hours. Sepsis Labs: Recent Labs  Lab 06/30/17 1215 07/01/17 0436  WBC 7.7 7.3    Microbiology No results found for this or any previous visit (from the past 240 hour(s)).  Procedures and diagnostic studies:  Ct Abdomen Pelvis W Contrast  Result Date: 06/30/2017 CLINICAL DATA:  Epigastric region pain with vomiting EXAM: CT ABDOMEN AND PELVIS WITH CONTRAST TECHNIQUE: Multidetector CT imaging of the abdomen and pelvis was performed using the standard protocol following bolus administration of intravenous contrast. CONTRAST:  13mL OMNIPAQUE IOHEXOL 300 MG/ML  SOLN COMPARISON:  None. FINDINGS: Lower chest: Lung bases are clear. Hepatobiliary: No focal liver lesions are evident. Gallbladder wall is not appreciably thickened. There is no biliary duct dilatation. Pancreas: There is no pancreatic mass or inflammatory focus. Spleen: No splenic lesions are evident. Adrenals/Urinary Tract: Adrenals bilaterally appear normal. Kidneys bilaterally show no evident mass or hydronephrosis on either side. There is no appreciable renal or ureteral calculus demonstrable on either side. Urinary bladder is midline with wall thickness within normal limits. Stomach/Bowel: There is moderate stool in the colon. There is no appreciable bowel wall or mesenteric thickening. There is no evident bowel obstruction. No free air or portal venous air. Vascular/Lymphatic: There is no abdominal aortic aneurysm. No vascular lesions are evident on this study. No adenopathy is appreciable in the abdomen or pelvis. Reproductive: Uterus is anteverted and slightly canted toward the right. No pelvic mass evident. Other: Appendix is normal in size and contour. There is no appendiceal region inflammation. A small  amount of increased attenuation material is noted in the appendix without surrounding thickening or inflammation. There is no ascites or abscess in the abdomen or pelvis. Small ventral hernia is noted containing only fat. Musculoskeletal: There are no blastic or lytic bone lesions. There is no intramuscular or abdominal wall lesions. IMPRESSION: 1. No bowel obstruction. No abscess. No appendiceal region inflammation. 2. No demonstrable renal or ureteral calculi. Note that intravenous contrast potentially could obscure small calculi in the collecting systems. No hydronephrosis on either side. 3.  Small ventral hernia containing only fat. Electronically Signed   By: Lowella Grip III M.D.   On: 06/30/2017 17:03   US Abdomen Limited Ruq  Result Date: 06/30/2017 CLINICAL DATA:  Right upper quadrant pain EXAM: ULTRASOUND ABDOMEN LIMITED RIGHT UPPER QUADRANT COMPARISON:  None. FINDINGS: Gallbladder: No gallstones or wall thickening visualized. There is no pericholecystic fluid. No sonographic Murphy sign noted by sonographer. Common bile duct: Diameter: 2 mm. No intrahepatic or extrahepatic biliary duct dilatation. Liver: No focal lesion identified. Within normal limits in parenchymal echogenicity. Portal vein is patent on color  Doppler imaging with normal direction of blood flow towards the liver. IMPRESSION: Study within normal limits. Electronically Signed   By: Lowella Grip III M.D.   On: 06/30/2017 15:01    Medications:   . enoxaparin (LOVENOX) injection  40 mg Subcutaneous Q24H   Continuous Infusions: . dextrose 5% lactated ringers 125 mL/hr at 07/01/17 1235  . [START ON 07/02/2017] levETIRAcetam       LOS: 1 day   Geradine Girt  [facility]   *Please refer to [email or web address], password [email or web address] to get updated schedule on who will round on this patient, as hospitalists switch teams weekly. If 7PM-7AM, please contact night-coverage at [email or web address], password [email or web address] for any overnight  needs.  07/01/2017, 2:57 PM

## 2017-07-01 NOTE — Progress Notes (Signed)
Patient states she has had three seizures in the past 10 minutes. Patient looking at phone/texting. RN sat at patient's bedside for observation for 5 minutes. Patient drowsy but communicating without difficulty. No distress or discomfort noticed. Will administer Ativan per order. Will notify MD.  Roselyn Reef [place],RN

## 2017-07-02 ENCOUNTER — Encounter (HOSPITAL_COMMUNITY): Payer: Self-pay | Admitting: *Deleted

## 2017-07-02 LAB — BASIC METABOLIC PANEL
ANION GAP: 7 (ref 5–15)
BUN: <5 mg/dL — ABNORMAL LOW (ref 6–20)
CALCIUM: 8.8 mg/dL — AB (ref 8.9–10.3)
CHLORIDE: 112 mmol/L — AB (ref 101–111)
CO2: 21 mmol/L — AB (ref 22–32)
Creatinine, Ser: 0.67 mg/dL (ref 0.44–1.00)
GFR calc Af Amer: >60 mL/min (ref >60)
GFR calc non Af Amer: >60 mL/min (ref >60)
GLUCOSE: 97 mg/dL (ref 65–99)
Potassium: 3.9 mmol/L (ref 3.5–5.1)
Sodium: 140 mmol/L (ref 135–145)

## 2017-07-02 LAB — CBC
HEMATOCRIT: 37.3 % (ref 36.0–46.0)
HEMOGLOBIN: 11.8 g/dL — AB (ref 12.0–15.0)
MCH: 27.8 pg (ref 26.0–34.0)
MCHC: 31.6 g/dL (ref 30.0–36.0)
MCV: 87.8 fL (ref 78.0–100.0)
Platelets: 188 10*3/uL (ref 150–400)
RBC: 4.25 MIL/uL (ref 3.87–5.11)
RDW: 12.7 % (ref 11.5–15.5)
WBC: 7.4 10*3/uL (ref 4.0–10.5)

## 2017-07-02 LAB — LIPASE, BLOOD: Lipase: 1027 U/L — ABNORMAL HIGH (ref 11–51)

## 2017-07-02 LAB — HIV ANTIBODY (ROUTINE TESTING W REFLEX): HIV Screen 4th Generation wRfx: NONREACTIVE

## 2017-07-02 MED ORDER — SERTRALINE HCL 100 MG PO TABS
100.0000 mg | ORAL_TABLET | Freq: Every day | ORAL | Status: DC
Start: 2017-07-02 — End: 2017-07-04
  Administered 2017-07-02 – 2017-07-04 (×3): 100 mg via ORAL
  Filled 2017-07-02 (×3): qty 1

## 2017-07-02 MED ORDER — FLUTICASONE FUROATE-VILANTEROL 200-25 MCG/INH IN AEPB
1.0000 | INHALATION_SPRAY | Freq: Every day | RESPIRATORY_TRACT | Status: DC
  Administered 2017-07-02 – 2017-07-04 (×3): 1 via RESPIRATORY_TRACT
  Filled 2017-07-02 (×2): qty 28

## 2017-07-02 MED ORDER — TOPIRAMATE 100 MG PO TABS
100.0000 mg | ORAL_TABLET | Freq: Every day | ORAL | Status: DC
  Administered 2017-07-02 – 2017-07-03 (×2): 100 mg via ORAL
  Filled 2017-07-02 (×2): qty 1

## 2017-07-02 MED ORDER — ALBUTEROL SULFATE (2.5 MG/3ML) 0.083% IN NEBU: 2.5000 mg | INHALATION_SOLUTION | Freq: Four times a day (QID) | RESPIRATORY_TRACT | Status: DC | PRN

## 2017-07-02 MED ORDER — RISPERIDONE 0.5 MG PO TABS
2.0000 mg | ORAL_TABLET | Freq: Two times a day (BID) | ORAL | Status: DC
  Administered 2017-07-02 – 2017-07-04 (×5): 2 mg via ORAL
  Filled 2017-07-02 (×5): qty 4

## 2017-07-02 MED ORDER — SUMATRIPTAN SUCCINATE 6 MG/0.5ML ~~LOC~~ SOLN
6.0000 mg | Freq: Once | SUBCUTANEOUS | Status: AC
  Administered 2017-07-02: 6 mg via SUBCUTANEOUS
  Filled 2017-07-02: qty 0.5

## 2017-07-02 MED ORDER — TRAMADOL HCL 50 MG PO TABS
50.0000 mg | ORAL_TABLET | Freq: Four times a day (QID) | ORAL | Status: DC | PRN
  Administered 2017-07-02: 50 mg via ORAL
  Filled 2017-07-02: qty 1

## 2017-07-02 NOTE — Progress Notes (Signed)
[facility] for Med Review for Causes of Pancreatitis  The patient's PTA med list have been reviewed and the risk of pancreatitis for each agent is listed below:  Cabergoline (pt listed as not taking): no reference to risk of pancreatitis Keppra: <1% risk Jolessa: pancreatitis risk if also with concurrent severe hypertriglyceridemia (noted TG on 5/26 were wnl at 75) Topamax: <1% risk Risperdal: <1% risk Zoloft: <1% risk  There is no clear medication-induced cause of the pancreatitis. Keppra, Topamax, Risperdal, or Zoloft do have at least one reported case in the literature however have not consistently been associated with acute pancreatitis with an overall risk of <1%. The combination of several of these agents could have increased this risk.   Pharmacy will sign off at this time. Please contact us with any additional questions. Thanks!  Thank you for allowing pharmacy to be a part of this patient's care.  Alycia Rossetti, PharmD, BCPS Clinical Pharmacist Pager: [phone] Clinical phone for 07/02/2017 from 7a-3:30p: [phone] If after 3:30p, please call main pharmacy at: [phone] 07/02/2017 2:08 PM

## 2017-07-02 NOTE — Progress Notes (Signed)
Progress Note    TASNEEM CORMIER  EXN:[id number] DOB: Feb 13, 1990  DOA: 06/30/2017 PCP: Kathyrn Lass, MD    Brief Narrative:    Medical records reviewed and are as summarized below:  [name] is an 27 y.o. female medical history significant of bipolar disorder, prolactinoma, seizure disorder as well as schizoaffective disorder who has had dysfunctional uterine bleeding in the past and has been on contraceptive management with OB/GYN. Patient is on multiple psych medications. She presented with abdominal pain and nausea. Pain is centrally located up to 10 out of 10. Denied any diarrhea. Denied any fever or chills. No sick contacts. Patient was evaluated and found to have elevated lipase and clinically appears to have pancreatitis. No history of alcohol abuse. Normal lipid profile and no evidence of gallstones and has scan. She is being admitted primarily to treat acute pancreatitis.     Assessment/Plan:   Principal Problem:   Pancreatitis, acute Active Problems:   Schizoaffective disorder, bipolar type by history(HCC)   PTSD (post-traumatic stress disorder)   Seizure disorder (HCC)   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   Contraceptive management   Acute pancreatitis  N/V due to acute pancreatitis:  -no obvious cause of her pancreatitis- ? Virus vs medication related -No gallstones or evidence of dilated common bile duc -advance diet slowly -outpatient GI follow up-- ? automimmune  seizure disorder:  -IV keppra   dehydration -IVF  H/o prolactinoma -outpatient follow up needs MRI  Body mass index is 31.86 kg/m.   Family Communication/Anticipated D/C date and plan/Code Status   DVT prophylaxis: Lovenox ordered. Code Status: Full Code.  Family Communication: called legal guardian Disposition Plan: home in 24-48 hours   Medical Consultants:    None.    Subjective:   Tolerating liquids today  Objective:    Vitals:   07/02/17 0749 07/02/17 0902  07/02/17 1144 07/02/17 1500  BP: 97/60  (!) 104/59 (!) 97/47  Pulse: (!) 57  (!) 52 62  Resp: 16  16 16   Temp: 97.8 F (36.6 C)  98.5 F (36.9 C) 98.3 F (36.8 C)  TempSrc: Oral  Oral Oral  SpO2: 99% 99% 99% 100%  Weight:      Height:        Intake/Output Summary (Last 24 hours) at 07/02/2017 1534 Last data filed at 07/02/2017 0400 Gross per 24 hour  Intake 1225 ml  Output -  Net 1225 ml   Filed Weights   06/30/17 2121  Weight: 84.2 kg (185 lb 10 oz)    Exam: In bed, asking to eat  rrr Minimal epigastric tenderness No LE edema  Data Reviewed:   I have personally reviewed following labs and imaging studies:  Labs: Labs show the following:   Basic Metabolic Panel: Recent Labs  Lab 06/30/17 1215 07/01/17 0436 07/02/17 0631  NA 139 138 140  K 4.1 3.8 3.9  CL 110 111 112*  CO2 21* 21* 21*  GLUCOSE 98 120* 97  BUN 12 6 <5*  CREATININE 0.72 0.56 0.67  CALCIUM 9.4 8.5* 8.8*   GFR Estimated Creatinine Clearance: 109.9 mL/min (by C-G formula based on SCr of 0.67 mg/dL). Liver Function Tests: Recent Labs  Lab 06/30/17 1215 07/01/17 0436  AST 23 19  ALT 17 16  ALKPHOS 75 60  BILITOT 0.7 0.4  PROT 7.4 6.1*  ALBUMIN 4.2 3.3*   Recent Labs  Lab 06/30/17 1215 06/30/17 1726 07/01/17 0436 07/02/17 0631  LIPASE 2,538* 2,447* 1,456* 1,027*  No results for input(s): AMMONIA in the last 168 hours. Coagulation profile No results for input(s): INR, PROTIME in the last 168 hours.  CBC: Recent Labs  Lab 06/30/17 1215 07/01/17 0436 07/02/17 0631  WBC 7.7 7.3 7.4  HGB 13.7 11.4* 11.8*  HCT 42.3 35.5* 37.3  MCV 84.8 86.2 87.8  PLT 205 183 188   Cardiac Enzymes: No results for input(s): CKTOTAL, CKMB, CKMBINDEX, TROPONINI in the last 168 hours. BNP (last 3 results) No results for input(s): PROBNP in the last 8760 hours. CBG: No results for input(s): GLUCAP in the last 168 hours. D-Dimer: No results for input(s): DDIMER in the last 72 hours. Hgb  A1c: No results for input(s): HGBA1C in the last 72 hours. Lipid Profile: Recent Labs    06/30/17 1725  TRIG 75   Thyroid function studies: No results for input(s): TSH, T4TOTAL, T3FREE, THYROIDAB in the last 72 hours.  Invalid input(s): FREET3 Anemia work up: No results for input(s): VITAMINB12, FOLATE, FERRITIN, TIBC, IRON, RETICCTPCT in the last 72 hours. Sepsis Labs: Recent Labs  Lab 06/30/17 1215 07/01/17 0436 07/02/17 0631  WBC 7.7 7.3 7.4    Microbiology No results found for this or any previous visit (from the past 240 hour(s)).  Procedures and diagnostic studies:  Ct Abdomen Pelvis W Contrast  Result Date: 06/30/2017 CLINICAL DATA:  Epigastric region pain with vomiting EXAM: CT ABDOMEN AND PELVIS WITH CONTRAST TECHNIQUE: Multidetector CT imaging of the abdomen and pelvis was performed using the standard protocol following bolus administration of intravenous contrast. CONTRAST:  169mL OMNIPAQUE IOHEXOL 300 MG/ML  SOLN COMPARISON:  None. FINDINGS: Lower chest: Lung bases are clear. Hepatobiliary: No focal liver lesions are evident. Gallbladder wall is not appreciably thickened. There is no biliary duct dilatation. Pancreas: There is no pancreatic mass or inflammatory focus. Spleen: No splenic lesions are evident. Adrenals/Urinary Tract: Adrenals bilaterally appear normal. Kidneys bilaterally show no evident mass or hydronephrosis on either side. There is no appreciable renal or ureteral calculus demonstrable on either side. Urinary bladder is midline with wall thickness within normal limits. Stomach/Bowel: There is moderate stool in the colon. There is no appreciable bowel wall or mesenteric thickening. There is no evident bowel obstruction. No free air or portal venous air. Vascular/Lymphatic: There is no abdominal aortic aneurysm. No vascular lesions are evident on this study. No adenopathy is appreciable in the abdomen or pelvis. Reproductive: Uterus is anteverted and slightly  canted toward the right. No pelvic mass evident. Other: Appendix is normal in size and contour. There is no appendiceal region inflammation. A small amount of increased attenuation material is noted in the appendix without surrounding thickening or inflammation. There is no ascites or abscess in the abdomen or pelvis. Small ventral hernia is noted containing only fat. Musculoskeletal: There are no blastic or lytic bone lesions. There is no intramuscular or abdominal wall lesions. IMPRESSION: 1. No bowel obstruction. No abscess. No appendiceal region inflammation. 2. No demonstrable renal or ureteral calculi. Note that intravenous contrast potentially could obscure small calculi in the collecting systems. No hydronephrosis on either side. 3.  Small ventral hernia containing only fat. Electronically Signed   By: Lowella Grip III M.D.   On: 06/30/2017 17:03    Medications:   . enoxaparin (LOVENOX) injection  40 mg Subcutaneous Q24H  . fluticasone furoate-vilanterol  1 puff Inhalation Daily  . risperiDONE  2 mg Oral BID  . sertraline  100 mg Oral Daily  . topiramate  100 mg Oral QHS  Continuous Infusions: . sodium chloride 125 mL/hr at 07/02/17 0802  . levETIRAcetam Stopped (07/02/17 1147)     LOS: 2 days   Geradine Girt  [facility]   *Please refer to [email or web address], password [email or web address] to get updated schedule on who will round on this patient, as hospitalists switch teams weekly. If 7PM-7AM, please contact night-coverage at [email or web address], password [email or web address] for any overnight needs.  07/02/2017, 3:34 PM

## 2017-07-03 ENCOUNTER — Telehealth: Payer: Self-pay | Admitting: Neurology

## 2017-07-03 DIAGNOSIS — G40909 Epilepsy, unspecified, not intractable, without status epilepticus: Secondary | ICD-10-CM

## 2017-07-03 LAB — COMPREHENSIVE METABOLIC PANEL
ALT: 18 U/L (ref 14–54)
AST: 16 U/L (ref 15–41)
Albumin: 3.8 g/dL (ref 3.5–5.0)
Alkaline Phosphatase: 72 U/L (ref 38–126)
Anion gap: 9 (ref 5–15)
BUN: <5 mg/dL — ABNORMAL LOW (ref 6–20)
CHLORIDE: 111 mmol/L (ref 101–111)
CO2: 20 mmol/L — AB (ref 22–32)
CREATININE: 0.59 mg/dL (ref 0.44–1.00)
Calcium: 9.4 mg/dL (ref 8.9–10.3)
GFR calc non Af Amer: >60 mL/min (ref >60)
Glucose, Bld: 107 mg/dL — ABNORMAL HIGH (ref 65–99)
POTASSIUM: 3.8 mmol/L (ref 3.5–5.1)
SODIUM: 140 mmol/L (ref 135–145)
Total Bilirubin: 0.6 mg/dL (ref 0.3–1.2)
Total Protein: 7 g/dL (ref 6.5–8.1)

## 2017-07-03 LAB — CBC
HCT: 38.7 % (ref 36.0–46.0)
Hemoglobin: 12.3 g/dL (ref 12.0–15.0)
MCH: 27.5 pg (ref 26.0–34.0)
MCHC: 31.8 g/dL (ref 30.0–36.0)
MCV: 86.4 fL (ref 78.0–100.0)
PLATELETS: 208 10*3/uL (ref 150–400)
RBC: 4.48 MIL/uL (ref 3.87–5.11)
RDW: 12.4 % (ref 11.5–15.5)
WBC: 7.2 10*3/uL (ref 4.0–10.5)

## 2017-07-03 MED ORDER — LEVETIRACETAM 500 MG PO TABS
500.0000 mg | ORAL_TABLET | Freq: Two times a day (BID) | ORAL | Status: DC
  Administered 2017-07-03 – 2017-07-04 (×2): 500 mg via ORAL
  Filled 2017-07-03 (×2): qty 1

## 2017-07-03 MED ORDER — TRAMADOL HCL 50 MG PO TABS
50.0000 mg | ORAL_TABLET | Freq: Two times a day (BID) | ORAL | Status: DC | PRN
  Administered 2017-07-03: 50 mg via ORAL
  Filled 2017-07-03: qty 1

## 2017-07-03 MED ORDER — ACETAMINOPHEN 325 MG PO TABS
650.0000 mg | ORAL_TABLET | Freq: Four times a day (QID) | ORAL | Status: DC | PRN
  Administered 2017-07-03 (×2): 650 mg via ORAL
  Filled 2017-07-03 (×2): qty 2

## 2017-07-03 NOTE — Progress Notes (Signed)
Patient had a good night and sleep, had 1x complaint of abd pain but eased off after oral pain medicine given.

## 2017-07-03 NOTE — Consult Note (Signed)
[facility] CM Primary Care Navigator  07/03/2017  [name] 1990-03-29 [id number]   Met withpatient at the bedsideto identify possible discharge needs. Patientreportsthat she had right upper quadrant abdominal pain, nausea and vomiting that had led to this admission (acute pancreatitis)  Patientendorses Dr.Lisa Hyacinth Meeker with [facility] as herprimary care provider.   [place] [facility] [address] [facility] [address] [facility] medications without difficulty.   Patient reportsmanagingherownmedications at homestraight out of the containers.  Patient statesthather mother in-law [name]), father in-law Molly Maduro) and father [name]) have been providingtransportation to herdoctors' appointments.  Patientmentionedthatshe lives in an apartment right behind her mother in-law's house. She verbalized being physically active and independent with self care as well as the caregiver for herself after her husband passed away last year. Patient states that mother in-law can assist her if needed.   Anticipateddischarge planishomeaccording to patient.  Patienthad voiced understanding to call primary care provider's officewhen shereturnshomefor a post discharge follow-upvisitwithin1- 2weeksor sooner if needs arise.Patient letter (with PCP's contact number) was provided asareminder.   Discussed with patientregarding [facility] CM services available for health management and resources at home but she deniesany needs at this time.  Patient shared that she is capable of managing her health needs so far. Patient verbalized understandingto seekreferral from primary care provider to [facility] care management if deemednecessaryand appropriate for anyservices in the future.  [facility] care management information was provided for future needs thatshe may have.  Patienthowever, had verbally  agreedand optedforEMMIcalls tofollowup herrecoveryat home.   Referral made for [facility] General calls after discharge.   For additional questions please contact:  Karin Golden A. [place] Urieta, BSN, RN-BC [facility] PRIMARY CARE Navigator Cell: [phone]

## 2017-07-03 NOTE — Telephone Encounter (Signed)
Patient is in the hospital and wants to talk to someone about something they are saying she has going on with her

## 2017-07-03 NOTE — Care Management Important Message (Signed)
Important Message  Patient Details  Name: [name] MRN: [id number] Date of Birth: 02-01-1991   Medicare Important Message Given:  Yes    Arlin Sass 07/03/2017, 1:52 PM

## 2017-07-03 NOTE — Progress Notes (Signed)
Progress Note    [name]  MEQ:[id number] DOB: [birth year]  DOA: 06/30/2017 PCP: Kathyrn Lass, MD    Brief Narrative:    Medical records reviewed and are as summarized below:  [name] is an 27 y.o. female medical history significant of bipolar disorder, prolactinoma, seizure disorder as well as schizoaffective disorder who has had dysfunctional uterine bleeding in the past and on multiple psych medications. She presented with abdominal pain and nausea. Pain is centrally located up to 10 out of 10.  She found to have elevated lipase and clinically appears to have pancreatitis of unknown source. No history of alcohol abuse. Normal lipid profile and no evidence of gallstones.Slow to improve.  Suspect home 5/30    Assessment/Plan:   Principal Problem:   Pancreatitis, acute Active Problems:   Schizoaffective disorder, bipolar type by history(HCC)   PTSD (post-traumatic stress disorder)   Seizure disorder (HCC)   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   Contraceptive management   Acute pancreatitis  N/V due to acute pancreatitis:  -no obvious cause of her pancreatitis- ? Virus vs medication related vs autoimmune -No gallstones or evidence of dilated common bile duct -do not want to change her psych meds as her guardian states she has been stable for the last few years and may soon be released to make her own decisions (per pharmacy: Keppra: <1% risk, Jolessa: pancreatitis risk if also with concurrent severe hypertriglyceridemia (noted TG on 5/26 were wnl at 75). Topamax: <1% risk, Risperdal: <1% risk. Zoloft: <1% risk There is no clear medication-induced cause of the pancreatitis. Keppra, Topamax, Risperdal, or Zoloft do have at least one reported case in the literature however have not consistently been associated with acute pancreatitis with an overall risk of <1%. The combination of several of these agents could have increased this risk. -advanced diet to soft today -outpatient  GI follow up  seizure disorder:  -keppra   dehydration -IVF  H/o prolactinoma -outpatient follow up needs MRI per chart review  Body mass index is 31.86 kg/m.   Family Communication/Anticipated D/C date and plan/Code Status   DVT prophylaxis: Lovenox ordered. Code Status: Full Code.  Family Communication: called legal guardian 5/28- please call again if ok to be d/c'd (legal Colan Neptune at [phone] ) Disposition Plan: home in AM   Medical Consultants:    None.    Subjective:   No further pain-- asking for soft diet  Objective:    Vitals:   07/03/17 0352 07/03/17 0717 07/03/17 0820 07/03/17 1251  BP: 97/82  114/63 102/65  Pulse: 65  79 78  Resp: 18  16 16   Temp: 98 F (36.7 C)  98.9 F (37.2 C) 98.2 F (36.8 C)  TempSrc: Oral  Oral Oral  SpO2: 98% 98% 100% 98%  Weight:      Height:        Intake/Output Summary (Last 24 hours) at 07/03/2017 1426 Last data filed at 07/03/2017 1244 Gross per 24 hour  Intake 3991.67 ml  Output -  Net 3991.67 ml   Filed Weights   06/30/17 2121  Weight: 84.2 kg (185 lb 10 oz)    Exam: Awake, NAD-- on phone No increased work of breathing rrr Min epigastric tenderness  Data Reviewed:   I have personally reviewed following labs and imaging studies:  Labs: Labs show the following:   Basic Metabolic Panel: Recent Labs  Lab 06/30/17 1215 07/01/17 0436 07/02/17 0631 07/03/17 0810  NA 139 138 140  140  K 4.1 3.8 3.9 3.8  CL 110 111 112* 111  CO2 21* 21* 21* 20*  GLUCOSE 98 120* 97 107*  BUN 12 6 <5* <5*  CREATININE 0.72 0.56 0.67 0.59  CALCIUM 9.4 8.5* 8.8* 9.4   GFR Estimated Creatinine Clearance: 109.9 mL/min (by C-G formula based on SCr of 0.59 mg/dL). Liver Function Tests: Recent Labs  Lab 06/30/17 1215 07/01/17 0436 07/03/17 0810  AST 23 19 16   ALT 17 16 18   ALKPHOS 75 60 72  BILITOT 0.7 0.4 0.6  PROT 7.4 6.1* 7.0  ALBUMIN 4.2 3.3* 3.8   Recent Labs  Lab 06/30/17 1215  06/30/17 1726 07/01/17 0436 07/02/17 0631  LIPASE 2,538* 2,447* 1,456* 1,027*   No results for input(s): AMMONIA in the last 168 hours. Coagulation profile No results for input(s): INR, PROTIME in the last 168 hours.  CBC: Recent Labs  Lab 06/30/17 1215 07/01/17 0436 07/02/17 0631 07/03/17 0810  WBC 7.7 7.3 7.4 7.2  HGB 13.7 11.4* 11.8* 12.3  HCT 42.3 35.5* 37.3 38.7  MCV 84.8 86.2 87.8 86.4  PLT 205 183 188 208   Cardiac Enzymes: No results for input(s): CKTOTAL, CKMB, CKMBINDEX, TROPONINI in the last 168 hours. BNP (last 3 results) No results for input(s): PROBNP in the last 8760 hours. CBG: No results for input(s): GLUCAP in the last 168 hours. D-Dimer: No results for input(s): DDIMER in the last 72 hours. Hgb A1c: No results for input(s): HGBA1C in the last 72 hours. Lipid Profile: Recent Labs    06/30/17 1725  TRIG 75   Thyroid function studies: No results for input(s): TSH, T4TOTAL, T3FREE, THYROIDAB in the last 72 hours.  Invalid input(s): FREET3 Anemia work up: No results for input(s): VITAMINB12, FOLATE, FERRITIN, TIBC, IRON, RETICCTPCT in the last 72 hours. Sepsis Labs: Recent Labs  Lab 06/30/17 1215 07/01/17 0436 07/02/17 0631 07/03/17 0810  WBC 7.7 7.3 7.4 7.2    Microbiology No results found for this or any previous visit (from the past 240 hour(s)).  Procedures and diagnostic studies:  No results found.  Medications:   . enoxaparin (LOVENOX) injection  40 mg Subcutaneous Q24H  . fluticasone furoate-vilanterol  1 puff Inhalation Daily  . risperiDONE  2 mg Oral BID  . sertraline  100 mg Oral Daily  . topiramate  100 mg Oral QHS   Continuous Infusions: . sodium chloride 75 mL/hr at 07/03/17 1141  . levETIRAcetam Stopped (07/03/17 1244)     LOS: 3 days   Geradine Girt  [facility]   *Please refer to [email or web address], password [email or web address] to get updated schedule on who will round on this patient, as hospitalists switch teams  weekly. If 7PM-7AM, please contact night-coverage at [email or web address], password [email or web address] for any overnight needs.  07/03/2017, 2:26 PM

## 2017-07-03 NOTE — Telephone Encounter (Signed)
Called and spoke with Pt. She was told that on a previous MRI it was noted she had a benign tumor. She has never been told that before. Advised her the last MRI of brain I see from 2017 states is normal, no change from 2010 study.

## 2017-07-03 NOTE — Care Management Note (Signed)
Case Management Note  Patient Details  Name: [name] MRN: [id number] Date of Birth: 1990/03/10  Subjective/Objective:    Pt admitted with acute pancreatitis. She is from home alone. She does have a legal guardian through [employer]: [name]. Paper work is on the chart.                   Action/Plan: Plan is for patient to return home at d/c. CM following for d/c needs, physician orders.   Expected Discharge Date:                  Expected Discharge Plan:  Home/Self Care  In-House Referral:     Discharge planning Services     Post Acute Care Choice:    Choice offered to:     DME Arranged:    DME Agency:     HH Arranged:    HH Agency:     Status of Service:  In process, will continue to follow  If discussed at Long Length of Stay Meetings, dates discussed:    Additional Comments:  Pollie Friar, RN 07/03/2017, 1:04 PM

## 2017-07-04 DIAGNOSIS — K8500 Idiopathic acute pancreatitis without necrosis or infection: Principal | ICD-10-CM

## 2017-07-04 NOTE — Care Management Note (Signed)
Case Management Note  Patient Details  Name: [name] MRN: [id number] Date of Birth: January 27, 1991  Subjective/Objective:                    Action/Plan: Pt discharging home with self care. CM spoke to Legal Guardian, [name], and he is aware of d/c. Pt's father in law to provide transportation home. Bedside RN updated.   Expected Discharge Date:  07/04/17               Expected Discharge Plan:  Home/Self Care  In-House Referral:     Discharge planning Services     Post Acute Care Choice:    Choice offered to:     DME Arranged:    DME Agency:     HH Arranged:    HH Agency:     Status of Service:  Completed, signed off  If discussed at [employer] of Stay Meetings, dates discussed:    Additional Comments:  Pollie Friar, RN 07/04/2017, 12:13 PM

## 2017-07-04 NOTE — Discharge Instructions (Signed)
Acute Pancreatitis Acute pancreatitis is a condition in which the pancreas suddenly gets irritated and swollen (has inflammation). The pancreas is a large gland behind the stomach. It makes enzymes that help to digest food. The pancreas also makes hormones that help to control your blood sugar. Acute pancreatitis happens when the enzymes attack the pancreas and damage it. Most attacks last a couple of days and can cause serious problems. Follow these instructions at home: Eating and drinking  Follow instructions from your doctor about diet. You may need to: ? Avoid alcohol. ? Limit how much fat is in your diet.  Eat small meals often. Avoid eating big meals.  Drink enough fluid to keep your pee (urine) clear or pale yellow.  Do not drink alcohol if it caused your condition. General instructions  Take over-the-counter and prescription medicines only as told by your doctor.  Do not use any tobacco products. These include cigarettes, chewing tobacco, and e-cigarettes. If you need help quitting, ask your doctor.  Get plenty of rest.  If directed, check your blood sugar at home as told by your doctor.  Keep all follow-up visits as told by your doctor. This is important. Contact a doctor if:  You do not get better as quickly as expected.  You have new symptoms.  Your symptoms get worse.  You have lasting pain or weakness.  You continue to feel sick to your stomach (nauseous).  You get better and then you have another pain attack.  You have a fever. Get help right away if:  You cannot eat or keep fluids down.  Your pain becomes very bad.  Your skin or the white part of your eyes turns yellow (jaundice).  You throw up (vomit).  You feel dizzy or you pass out (faint).  Your blood sugar is high (over 300 mg/dL). This information is not intended to replace advice given to you by your health care provider. Make sure you discuss any questions you have with your health care  provider. Document Released: 07/11/2007 Document Revised: 06/30/2015 Document Reviewed: 10/26/2014 Elsevier Interactive Patient Education  2018 [employer]  

## 2017-07-04 NOTE — Discharge Summary (Signed)
[facility]  Physician Discharge Summary   Patient ID: [name] MRN: [id number] DOB/AGE: 1990-11-25 27 y.o.  Admit date: 06/30/2017 Discharge date: 07/04/2017  PCP: Kathyrn Lass, MD  DISCHARGE DIAGNOSES:  Acute idiopathic pancreatitis  RECOMMENDATIONS FOR OUTPATIENT FOLLOW UP: 1. Consider outpatient referral to gastroenterology for further work-up of pancreatitis   DISCHARGE CONDITION: fair  Diet recommendation: Soft bland diet for a few days  Filed Weights   06/30/17 2121  Weight: 84.2 kg (185 lb 10 oz)    INITIAL HISTORY: [name] is an 27 y.o. female medical history significant ofbipolar disorder, prolactinoma, seizure disorder as well as schizoaffective disorder who has had dysfunctional uterine bleeding in the past and on multiple psych medications. She presented with abdominal pain and nausea. Pain is centrally located up to 10 out of 10.  She found to have elevated lipase and clinically appears to have pancreatitis of unknown source.  HOSPITAL COURSE:   Acute pancreatitis -no obvious cause of her pancreatitis- ? Virus vs medication related vs autoimmune -No gallstones or evidence of dilated common bile duct.  Patient denies any alcohol use.  Triglyceride levels were normal. -Do not want to change her psych meds as her guardian states she has been stable for the last few years and may soon be released to make her own decisions (per pharmacy: Keppra: <1% risk, Jolessa:pancreatitisrisk if also with concurrentsevere hypertriglyceridemia (noted TG on 5/26 were wnl at 75). Topamax: <1% risk, Risperdal: <1% risk. Zoloft: <1% risk There is no clear medication-induced cause of the pancreatitis. Keppra, Topamax, Risperdal, or Zoloft do have at least one reported case in the literature however have not consistently been associated with acute pancreatitis with an overall risk of <1%. The combination of several of these agents could have increased this  risk. -She is diet was advanced.  She would benefit from being referred to gastroenterology as outpatient for further evaluation.  Seizure disorder Stable.  Continue home medications.  History of bipolar disorder Stable on current medications.  Continue.  H/o prolactinoma -outpatient follow up needs MRI per chart review  Body mass index is 31.86 kg/m.  Overall stable.  Patient wants to go home.  She has tolerated her diet.  Denies any abdominal pain nausea or vomiting.  Okay for discharge home today.  Discussed with her guardian.   PERTINENT LABS:  The results of significant diagnostics from this hospitalization (including imaging, microbiology, ancillary and laboratory) are listed below for reference.     Labs: Basic Metabolic Panel: Recent Labs  Lab 06/30/17 1215 07/01/17 0436 07/02/17 0631 07/03/17 0810  NA 139 138 140 140  K 4.1 3.8 3.9 3.8  CL 110 111 112* 111  CO2 21* 21* 21* 20*  GLUCOSE 98 120* 97 107*  BUN 12 6 <5* <5*  CREATININE 0.72 0.56 0.67 0.59  CALCIUM 9.4 8.5* 8.8* 9.4   Liver Function Tests: Recent Labs  Lab 06/30/17 1215 07/01/17 0436 07/03/17 0810  AST 23 19 16   ALT 17 16 18   ALKPHOS 75 60 72  BILITOT 0.7 0.4 0.6  PROT 7.4 6.1* 7.0  ALBUMIN 4.2 3.3* 3.8   Recent Labs  Lab 06/30/17 1215 06/30/17 1726 07/01/17 0436 07/02/17 0631  LIPASE 2,538* 2,447* 1,456* 1,027*   CBC: Recent Labs  Lab 06/30/17 1215 07/01/17 0436 07/02/17 0631 07/03/17 0810  WBC 7.7 7.3 7.4 7.2  HGB 13.7 11.4* 11.8* 12.3  HCT 42.3 35.5* 37.3 38.7  MCV 84.8 86.2 87.8 86.4  PLT 205 183 188  208    IMAGING STUDIES Ct Abdomen Pelvis W Contrast  Result Date: 06/30/2017 CLINICAL DATA:  Epigastric region pain with vomiting EXAM: CT ABDOMEN AND PELVIS WITH CONTRAST TECHNIQUE: Multidetector CT imaging of the abdomen and pelvis was performed using the standard protocol following bolus administration of intravenous contrast. CONTRAST:  167mL OMNIPAQUE IOHEXOL  300 MG/ML  SOLN COMPARISON:  None. FINDINGS: Lower chest: Lung bases are clear. Hepatobiliary: No focal liver lesions are evident. Gallbladder wall is not appreciably thickened. There is no biliary duct dilatation. Pancreas: There is no pancreatic mass or inflammatory focus. Spleen: No splenic lesions are evident. Adrenals/Urinary Tract: Adrenals bilaterally appear normal. Kidneys bilaterally show no evident mass or hydronephrosis on either side. There is no appreciable renal or ureteral calculus demonstrable on either side. Urinary bladder is midline with wall thickness within normal limits. Stomach/Bowel: There is moderate stool in the colon. There is no appreciable bowel wall or mesenteric thickening. There is no evident bowel obstruction. No free air or portal venous air. Vascular/Lymphatic: There is no abdominal aortic aneurysm. No vascular lesions are evident on this study. No adenopathy is appreciable in the abdomen or pelvis. Reproductive: Uterus is anteverted and slightly canted toward the right. No pelvic mass evident. Other: Appendix is normal in size and contour. There is no appendiceal region inflammation. A small amount of increased attenuation material is noted in the appendix without surrounding thickening or inflammation. There is no ascites or abscess in the abdomen or pelvis. Small ventral hernia is noted containing only fat. Musculoskeletal: There are no blastic or lytic bone lesions. There is no intramuscular or abdominal wall lesions. IMPRESSION: 1. No bowel obstruction. No abscess. No appendiceal region inflammation. 2. No demonstrable renal or ureteral calculi. Note that intravenous contrast potentially could obscure small calculi in the collecting systems. No hydronephrosis on either side. 3.  Small ventral hernia containing only fat. Electronically Signed   By: Lowella Grip III M.D.   On: 06/30/2017 17:03   US Abdomen Limited Ruq  Result Date: 06/30/2017 CLINICAL DATA:  Right upper  quadrant pain EXAM: ULTRASOUND ABDOMEN LIMITED RIGHT UPPER QUADRANT COMPARISON:  None. FINDINGS: Gallbladder: No gallstones or wall thickening visualized. There is no pericholecystic fluid. No sonographic Murphy sign noted by sonographer. Common bile duct: Diameter: 2 mm. No intrahepatic or extrahepatic biliary duct dilatation. Liver: No focal lesion identified. Within normal limits in parenchymal echogenicity. Portal vein is patent on color Doppler imaging with normal direction of blood flow towards the liver. IMPRESSION: Study within normal limits. Electronically Signed   By: Lowella Grip III M.D.   On: 06/30/2017 15:01    DISCHARGE EXAMINATION: Vitals:   07/04/17 0006 07/04/17 0410 07/04/17 0646 07/04/17 0722  BP: (!) 100/48 (!) 77/50 103/68   Pulse: 62 (!) 54 60   Resp: 18 18 18    Temp: 98.5 F (36.9 C) 97.7 F (36.5 C)    TempSrc: Oral     SpO2: 100% 100%  99%  Weight:      Height:       General appearance: alert, cooperative, appears stated age and no distress Resp: clear to auscultation bilaterally Cardio: regular rate and rhythm, S1, S2 normal, no murmur, click, rub or gallop GI: soft, non-tender; bowel sounds normal; no masses,  no organomegaly Extremities: extremities normal, atraumatic, no cyanosis or edema  DISPOSITION: Home  Discharge Instructions    Call MD for:  extreme fatigue   Complete by:  As directed    Call MD for:  persistant  dizziness or light-headedness   Complete by:  As directed    Call MD for:  persistant nausea and vomiting   Complete by:  As directed    Call MD for:  severe uncontrolled pain   Complete by:  As directed    Call MD for:  temperature >100.4   Complete by:  As directed    Discharge instructions   Complete by:  As directed    Eat a soft bland diet for the next 2 to 3 days.  See your primary care provider within 1 week and discuss referral to gastroenterology to pursue further evaluation of pancreatitis.  You were cared for by a  hospitalist during your hospital stay. If you have any questions about your discharge medications or the care you received while you were in the hospital after you are discharged, you can call the unit and asked to speak with the hospitalist on call if the hospitalist that took care of you is not available. Once you are discharged, your primary care physician will handle any further medical issues. Please note that NO REFILLS for any discharge medications will be authorized once you are discharged, as it is imperative that you return to your primary care physician (or establish a relationship with a primary care physician if you do not have one) for your aftercare needs so that they can reassess your need for medications and monitor your lab values. If you do not have a primary care physician, you can call [phone] for a physician referral.   Increase activity slowly   Complete by:  As directed         Allergies as of 07/04/2017      Reactions   Bee Venom Anaphylaxis   Coconut Flavor Anaphylaxis   Coconut Oil Anaphylaxis, Rash   Geodon [ziprasidone Hcl] Other (See Comments)   Pt states that this medication causes paralysis of the mouth.     Haloperidol And Related Other (See Comments)   Pt states that this medication causes paralysis of the mouth.     Lithium Other (See Comments)   Reaction:  Seizure-like activity.    Oxycodone Other (See Comments)   Reaction:  Hallucinations    Quetiapine Fumarate Other (See Comments)   TOO STRONG   Seroquel [quetiapine Fumarate] Other (See Comments)   Pt states that this medication is too strong.    Shellfish Allergy Anaphylaxis   Percocet [oxycodone-acetaminophen] Other (See Comments)   Reaction:  Hallucinations    Phenergan [promethazine Hcl] Other (See Comments)   Reaction:  Chest pain    Prilosec [omeprazole] Nausea And Vomiting   Sulfa Antibiotics Other (See Comments)   Chest pain    Tegretol [carbamazepine] Nausea And Vomiting        Medication List    TAKE these medications   acetaminophen 500 MG tablet Commonly known as:  TYLENOL Take 1,000 mg by mouth every 6 (six) hours as needed for mild pain, moderate pain or headache.   albuterol 108 (90 Base) MCG/ACT inhaler Commonly known as:  PROVENTIL HFA;VENTOLIN HFA Inhale 1-2 puffs into the lungs every 6 (six) hours as needed for wheezing or shortness of breath.   cabergoline 0.5 MG tablet Commonly known as:  DOSTINEX Take 0.5 tablets (0.25 mg total) by mouth 2 (two) times a week.   fluticasone-salmeterol 115-21 MCG/ACT inhaler Commonly known as:  ADVAIR HFA Inhale 2 puffs into the lungs 2 (two) times daily.   levETIRAcetam 500 MG tablet Commonly known as:  KEPPRA  TAKE 1 TABLET BY MOUTH TWICE A DAY What changed:  Another medication with the same name was removed. Continue taking this medication, and follow the directions you see here.   levonorgestrel-ethinyl estradiol 0.15-0.03 MG tablet Commonly known as:  JOLESSA Take 1 tablet by mouth daily.   multivitamin tablet Take 1 tablet by mouth daily.   risperiDONE 2 MG tablet Commonly known as:  RISPERDAL Take 2 mg by mouth 2 (two) times daily.   sertraline 100 MG tablet Commonly known as:  ZOLOFT Take 100 mg by mouth daily.   topiramate 100 MG tablet Commonly known as:  TOPAMAX Take 1 tablet (100 mg total) by mouth at bedtime. What changed:  Another medication with the same name was removed. Continue taking this medication, and follow the directions you see here.        Follow-up Information    Kathyrn Lass, MD. Schedule an appointment as soon as possible for a visit in 1 week(s).   Specialty:  Family Medicine Why:  discuss referral to Gastroenterology for pancreatitis Contact information: [place] [address] [phone]           TOTAL DISCHARGE TIME: 51 minutes  [facility] Pager [phone]  07/04/2017, 3:23 PM

## 2017-07-06 DIAGNOSIS — T391X1A Poisoning by 4-Aminophenol derivatives, accidental (unintentional), initial encounter: Secondary | ICD-10-CM

## 2017-07-06 HISTORY — DX: Poisoning by 4-aminophenol derivatives, accidental (unintentional), initial encounter: T39.1X1A

## 2017-07-12 DIAGNOSIS — R634 Abnormal weight loss: Secondary | ICD-10-CM | POA: Diagnosis not present

## 2017-07-12 DIAGNOSIS — K8500 Idiopathic acute pancreatitis without necrosis or infection: Secondary | ICD-10-CM | POA: Diagnosis not present

## 2017-07-14 ENCOUNTER — Other Ambulatory Visit: Payer: Self-pay

## 2017-07-14 ENCOUNTER — Emergency Department (HOSPITAL_COMMUNITY)
Admission: EM | Admit: 2017-07-14 | Discharge: 2017-07-14 | Disposition: A | Discharge status: Home / Self Care | Payer: PPO | Attending: Emergency Medicine | Admitting: Emergency Medicine

## 2017-07-14 ENCOUNTER — Encounter (HOSPITAL_COMMUNITY): Payer: Self-pay | Admitting: Emergency Medicine

## 2017-07-14 ENCOUNTER — Emergency Department (HOSPITAL_COMMUNITY): Payer: PPO

## 2017-07-14 DIAGNOSIS — R1114 Bilious vomiting: Secondary | ICD-10-CM

## 2017-07-14 DIAGNOSIS — Z79899 Other long term (current) drug therapy: Secondary | ICD-10-CM | POA: Diagnosis not present

## 2017-07-14 DIAGNOSIS — Z87891 Personal history of nicotine dependence: Secondary | ICD-10-CM | POA: Insufficient documentation

## 2017-07-14 DIAGNOSIS — R1011 Right upper quadrant pain: Secondary | ICD-10-CM | POA: Insufficient documentation

## 2017-07-14 DIAGNOSIS — J45909 Unspecified asthma, uncomplicated: Secondary | ICD-10-CM | POA: Insufficient documentation

## 2017-07-14 DIAGNOSIS — R112 Nausea with vomiting, unspecified: Secondary | ICD-10-CM | POA: Insufficient documentation

## 2017-07-14 DIAGNOSIS — R1013 Epigastric pain: Secondary | ICD-10-CM | POA: Insufficient documentation

## 2017-07-14 LAB — URINALYSIS, ROUTINE W REFLEX MICROSCOPIC
BILIRUBIN URINE: NEGATIVE
Glucose, UA: NEGATIVE mg/dL
Hgb urine dipstick: NEGATIVE
KETONES UR: NEGATIVE mg/dL
Leukocytes, UA: NEGATIVE
NITRITE: NEGATIVE
PROTEIN: NEGATIVE mg/dL
Specific Gravity, Urine: 1.009 (ref 1.005–1.030)
pH: 6 (ref 5.0–8.0)

## 2017-07-14 LAB — I-STAT BETA HCG BLOOD, ED (MC, WL, AP ONLY)

## 2017-07-14 LAB — COMPREHENSIVE METABOLIC PANEL
ALBUMIN: 4.3 g/dL (ref 3.5–5.0)
ALK PHOS: 78 U/L (ref 38–126)
ALT: 15 U/L (ref 14–54)
AST: 25 U/L (ref 15–41)
Anion gap: 8 (ref 5–15)
BILIRUBIN TOTAL: 0.8 mg/dL (ref 0.3–1.2)
BUN: 16 mg/dL (ref 6–20)
CALCIUM: 9.5 mg/dL (ref 8.9–10.3)
CO2: 21 mmol/L — ABNORMAL LOW (ref 22–32)
Chloride: 113 mmol/L — ABNORMAL HIGH (ref 101–111)
Creatinine, Ser: 0.88 mg/dL (ref 0.44–1.00)
GFR calc Af Amer: >60 mL/min (ref >60)
GLUCOSE: 95 mg/dL (ref 65–99)
Potassium: 3.9 mmol/L (ref 3.5–5.1)
Sodium: 142 mmol/L (ref 135–145)
TOTAL PROTEIN: 7.4 g/dL (ref 6.5–8.1)

## 2017-07-14 LAB — CBC
HCT: 43.2 % (ref 36.0–46.0)
Hemoglobin: 13.7 g/dL (ref 12.0–15.0)
MCH: 27.6 pg (ref 26.0–34.0)
MCHC: 31.7 g/dL (ref 30.0–36.0)
MCV: 86.9 fL (ref 78.0–100.0)
PLATELETS: 259 10*3/uL (ref 150–400)
RBC: 4.97 MIL/uL (ref 3.87–5.11)
RDW: 12.6 % (ref 11.5–15.5)
WBC: 9.4 10*3/uL (ref 4.0–10.5)

## 2017-07-14 LAB — LIPASE, BLOOD: Lipase: 148 U/L — ABNORMAL HIGH (ref 11–51)

## 2017-07-14 MED ORDER — ONDANSETRON HCL 4 MG/2ML IJ SOLN
4.0000 mg | Freq: Once | INTRAMUSCULAR | Status: AC
  Administered 2017-07-14: 4 mg via INTRAVENOUS
  Filled 2017-07-14: qty 2

## 2017-07-14 MED ORDER — KETOROLAC TROMETHAMINE 30 MG/ML IJ SOLN
15.0000 mg | Freq: Once | INTRAMUSCULAR | Status: AC
  Administered 2017-07-14: 15 mg via INTRAVENOUS
  Filled 2017-07-14: qty 1

## 2017-07-14 MED ORDER — ONDANSETRON 4 MG PO TBDP: 4.0000 mg | ORAL_TABLET | Freq: Three times a day (TID) | ORAL | 0 refills | Status: DC | PRN

## 2017-07-14 MED ORDER — FENTANYL CITRATE (PF) 100 MCG/2ML IJ SOLN
25.0000 ug | Freq: Once | INTRAMUSCULAR | Status: AC
  Administered 2017-07-14: 25 ug via INTRAVENOUS
  Filled 2017-07-14: qty 2

## 2017-07-14 MED ORDER — SODIUM CHLORIDE 0.9 % IV BOLUS
1000.0000 mL | Freq: Once | INTRAVENOUS | Status: AC
  Administered 2017-07-14: 1000 mL via INTRAVENOUS

## 2017-07-14 MED ORDER — ONDANSETRON 4 MG PO TBDP
4.0000 mg | ORAL_TABLET | Freq: Once | ORAL | Status: AC | PRN
  Administered 2017-07-14: 4 mg via ORAL
  Filled 2017-07-14: qty 1

## 2017-07-14 MED ORDER — HYDROCODONE-ACETAMINOPHEN 5-325 MG PO TABS: 1.0000 | ORAL_TABLET | Freq: Four times a day (QID) | ORAL | 0 refills | Status: DC | PRN

## 2017-07-14 NOTE — ED Provider Notes (Signed)
[facility] EMERGENCY DEPARTMENT Provider Note   CSN: [id number] Arrival date & time: 07/14/17  1858     History   Chief Complaint Chief Complaint  Patient presents with  . Abdominal Pain    HPI [name] is a 27 y.o. female.  HPI  Patient presents with concern of abdominal pain, nausea, vomiting. Onset was 2 days ago. Notably the patient was discharged from this facility 4 days ago. She had an episode of pancreatitis, notes that on discharge she was improving but only for about 36 hours. Since that time she has had persistent symptoms, sharp severe pain, not improved with OTC medication. She denies other medication use, including narcotics.  When she does not smoke, does not drink. She has not had diarrhea. The pain is nonradiating, epigastric, right upper quadrant, severe.  Past Medical History:  Diagnosis Date  . Acid reflux   . Asthma    last attack 03/13/15 or 03/14/15  . Autism   . 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 Chronic constipation   . Depression   . Drug-seeking behavior   . Essential tremor   . Headache   . Personality disorder ([place])   . Schizo-affective psychosis ([place])   . Schizoaffective disorder, bipolar type ([place])   . Seizures [facility])    Last seizure December 2017    Patient Active Problem List   Diagnosis Date Noted  . Pancreatitis, acute 06/30/2017  . Acute pancreatitis 06/30/2017  . DUB (dysfunctional uterine bleeding) 11/22/2016  . Prolactinoma ([place]) 08/20/2016  . Contraceptive management 08/09/2016  . Tachycardia with heart rate 121-140 beats per minute 01/13/2016  . 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  . Seizure disorder ([place]) 08/08/2015  . Chronic migraine 07/27/2015  . Asthma 04/15/2015  . Schizoaffective disorder, bipolar type by history(HCC) 03/10/2014  . Borderline personality disorder ([place]) 03/10/2014  . PTSD (post-traumatic stress disorder) 03/10/2014  . Autism spectrum disorder 06/15/2013    Past  Surgical History:  Procedure Laterality Date  . MOUTH SURGERY  2009 or 2010     OB History    Gravida  2   Para  1   Term  1   Preterm      AB  1   Living  1     SAB  1   TAB      Ectopic      Multiple  0   Live Births  1            Home Medications    Prior to Admission medications   Medication Sig Start Date End Date Taking? Authorizing Provider  acetaminophen (TYLENOL) 500 MG tablet Take 1,000 mg by mouth every 6 (six) hours as needed for mild pain, moderate pain or headache.    [provider]  albuterol (PROVENTIL HFA;VENTOLIN HFA) 108 (90 Base) MCG/ACT inhaler Inhale 1-2 puffs into the lungs every 6 (six) hours as needed for wheezing or shortness of breath.    [provider]  cabergoline (DOSTINEX) 0.5 MG tablet Take 0.5 tablets (0.25 mg total) by mouth 2 (two) times a week. Patient not taking: Reported on 06/30/2017 08/20/16   Chancy Milroy, MD  fluticasone-salmeterol (ADVAIR HFA) [phone] MCG/ACT inhaler Inhale 2 puffs into the lungs 2 (two) times daily. 08/03/15   Timmothy Euler, MD  levETIRAcetam (KEPPRA) 500 MG tablet TAKE 1 TABLET BY MOUTH TWICE A DAY 05/06/17   Pieter Partridge, DO  levonorgestrel-ethinyl estradiol (JOLESSA) 0.15-0.03 MG tablet  Take 1 tablet by mouth daily. Patient not taking: Reported on 06/30/2017 08/09/16   Chancy Milroy, MD  Multiple Vitamin (MULTIVITAMIN) tablet Take 1 tablet by mouth daily.    [provider]  risperiDONE (RISPERDAL) 2 MG tablet Take 2 mg by mouth 2 (two) times daily.     [provider]  sertraline (ZOLOFT) 100 MG tablet Take 100 mg by mouth daily.    [provider]  topiramate (TOPAMAX) 100 MG tablet Take 1 tablet (100 mg total) by mouth at bedtime. 09/04/16   Pieter Partridge, DO    Family History Family History  Problem Relation Age of Onset  . Mental illness Father   . Asthma Father   . PDD Brother   . Seizures Brother     Social History Social History    Tobacco Use  . Smoking status: Former Smoker    Packs/day: 0.00  . Smokeless tobacco: Never Used  . Tobacco comment: Smoked for 2  years age [age]  Substance Use Topics  . Alcohol use: No    Alcohol/week: 0.6 oz    Types: 1 Standard drinks or equivalent per week  . Drug use: No    Comment: History of cocaine use at age 89 for 4 months     Allergies   Bee venom; Coconut flavor; Coconut oil; Geodon [ziprasidone hcl]; Haloperidol and related; Lithium; Oxycodone; Quetiapine fumarate; Seroquel [quetiapine fumarate]; Shellfish allergy; Percocet [oxycodone-acetaminophen]; Phenergan [promethazine hcl]; Prilosec [omeprazole]; Sulfa antibiotics; and Tegretol [carbamazepine]   Review of Systems Review of Systems  Constitutional:       Per HPI, otherwise negative  HENT:       Per HPI, otherwise negative  Respiratory:       Per HPI, otherwise negative  Cardiovascular:       Per HPI, otherwise negative  Gastrointestinal: Positive for abdominal pain, nausea and vomiting.  Endocrine:       Negative aside from HPI  Genitourinary:       Neg aside from HPI   Musculoskeletal:       Per HPI, otherwise negative  Skin: Negative.   Neurological: Negative for syncope.     Physical Exam Updated Vital Signs BP 111/80 (BP Location: Right Arm)   Pulse 94   Temp 98 F (36.7 C) (Oral)   Resp 18   Ht 5\' 4"  (1.626 m)   Wt 80.3 kg (177 lb)   LMP 04/14/2017 (Approximate)   SpO2 97%   BMI 30.38 kg/m   Physical Exam  Constitutional: She is oriented to person, place, and time. She appears well-developed and well-nourished. No distress.  HENT:  Head: Normocephalic and atraumatic.  Eyes: Conjunctivae and EOM are normal.  Cardiovascular: Normal rate and regular rhythm.  Pulmonary/Chest: Effort normal and breath sounds normal. No stridor. No respiratory distress.  Abdominal: She exhibits no distension. There is tenderness in the right upper quadrant and epigastric area. There is guarding.   Musculoskeletal: She exhibits no edema.  Neurological: She is alert and oriented to person, place, and time. No cranial nerve deficit.  Skin: Skin is warm and dry.  Psychiatric: She has a normal mood and affect.  Nursing note and vitals reviewed.    ED Treatments / Results  Labs (all labs ordered are listed, but only abnormal results are displayed) Labs Reviewed  LIPASE, BLOOD - Abnormal; Notable for the following components:      Result Value   Lipase 148 (*)    All other components within normal  limits  COMPREHENSIVE METABOLIC PANEL - Abnormal; Notable for the following components:   Chloride 113 (*)    CO2 21 (*)    All other components within normal limits  URINALYSIS, ROUTINE W REFLEX MICROSCOPIC - Abnormal; Notable for the following components:   APPearance HAZY (*)    All other components within normal limits  CBC  I-STAT BETA HCG BLOOD, ED ([facility], [facility], [facility] ONLY)    EKG EKG Interpretation  Date/Time:  Sunday July 14 2017 19:15:04 EDT Ventricular Rate:  95 PR Interval:  124 QRS Duration: 84 QT Interval:  370 QTC Calculation: 464 R Axis:   72 Text Interpretation:  Normal sinus rhythm Normal ECG Normal ECG Confirmed by Carmin Muskrat [phone]) on 07/14/2017 8:01:06 PM   Radiology No results found.  Procedures Procedures (including critical care time)  Medications Ordered in ED Medications  sodium chloride 0.9 % bolus 1,000 mL (has no administration in time range)  ketorolac (TORADOL) 30 MG/ML injection 15 mg (has no administration in time range)  ondansetron (ZOFRAN) injection 4 mg (has no administration in time range)  fentaNYL (SUBLIMAZE) injection 25 mcg (has no administration in time range)  ondansetron (ZOFRAN-ODT) disintegrating tablet 4 mg (4 mg Oral Given 07/14/17 1911)     Initial Impression / Assessment and Plan / ED Course  I have reviewed the triage vital signs and the nursing notes.  Pertinent labs & imaging results that were available during my  care of the patient were reviewed by me and considered in my medical decision making (see chart for details).    10:21 PM I reviewed the patient's notes from recent hospitalization, including CT imaging. Today's ultrasound is reassuring. Patient had recent substantial abnormalities with lipase greater than 1000, but today is only greater than 100. Patient was discharged without pain medicine without nausea medicine, and given the reassuring ultrasound, reassuring labs, it seems as though she is recovering from her illness, though with ongoing symptoms. Patient remains afebrile, hemodynamically unremarkable, with a non-peritoneal abdomen. Patient appropriate for discharge with provision of analgesia, antiemetics, and gastroneurology follow-up this week.   Final Clinical Impressions(s) / ED Diagnoses  Nausea and vomiting Abdominal pain   Carmin Muskrat, MD 07/14/17 2222

## 2017-07-14 NOTE — Discharge Instructions (Signed)
As discussed, your evaluation today has been largely reassuring.  But, it is important that you monitor your condition carefully, and do not hesitate to return to the ED if you develop new, or concerning changes in your condition. ? ?Otherwise, please follow-up with your physician for appropriate ongoing care. ? ?

## 2017-07-14 NOTE — ED Triage Notes (Signed)
Pt reports 9/10 sharp, upper rt abd pain since Friday. Pt was here last week and dx with pancreatitis. Pt reports constant Nausea and Vomiting x2, loss of appetite with wt loss.

## 2017-07-16 ENCOUNTER — Encounter: Payer: Self-pay | Admitting: Obstetrics and Gynecology

## 2017-07-21 ENCOUNTER — Encounter (HOSPITAL_COMMUNITY): Payer: Self-pay | Admitting: Emergency Medicine

## 2017-07-21 ENCOUNTER — Inpatient Hospital Stay (HOSPITAL_COMMUNITY)
Admission: EM | Admit: 2017-07-21 | Discharge: 2017-07-24 | DRG: 918 | Disposition: A | Discharge status: Other Acute Inpatient Hospital | Payer: PPO | Attending: Internal Medicine | Admitting: Internal Medicine

## 2017-07-21 DIAGNOSIS — I4581 Long QT syndrome: Secondary | ICD-10-CM | POA: Diagnosis not present

## 2017-07-21 DIAGNOSIS — T43222A Poisoning by selective serotonin reuptake inhibitors, intentional self-harm, initial encounter: Secondary | ICD-10-CM | POA: Diagnosis not present

## 2017-07-21 DIAGNOSIS — T1491XA Suicide attempt, initial encounter: Secondary | ICD-10-CM | POA: Diagnosis not present

## 2017-07-21 DIAGNOSIS — Z888 Allergy status to other drugs, medicaments and biological substances status: Secondary | ICD-10-CM | POA: Diagnosis not present

## 2017-07-21 DIAGNOSIS — Z9114 Patient's other noncompliance with medication regimen: Secondary | ICD-10-CM | POA: Diagnosis not present

## 2017-07-21 DIAGNOSIS — G4700 Insomnia, unspecified: Secondary | ICD-10-CM | POA: Diagnosis not present

## 2017-07-21 DIAGNOSIS — F250 Schizoaffective disorder, bipolar type: Secondary | ICD-10-CM | POA: Diagnosis not present

## 2017-07-21 DIAGNOSIS — Z825 Family history of asthma and other chronic lower respiratory diseases: Secondary | ICD-10-CM | POA: Diagnosis not present

## 2017-07-21 DIAGNOSIS — I959 Hypotension, unspecified: Secondary | ICD-10-CM | POA: Diagnosis present

## 2017-07-21 DIAGNOSIS — Z818 Family history of other mental and behavioral disorders: Secondary | ICD-10-CM | POA: Diagnosis not present

## 2017-07-21 DIAGNOSIS — F840 Autistic disorder: Secondary | ICD-10-CM | POA: Diagnosis not present

## 2017-07-21 DIAGNOSIS — G250 Essential tremor: Secondary | ICD-10-CM | POA: Diagnosis not present

## 2017-07-21 DIAGNOSIS — T391X2A Poisoning by 4-Aminophenol derivatives, intentional self-harm, initial encounter: Principal | ICD-10-CM | POA: Diagnosis present

## 2017-07-21 DIAGNOSIS — R634 Abnormal weight loss: Secondary | ICD-10-CM | POA: Diagnosis not present

## 2017-07-21 DIAGNOSIS — J45909 Unspecified asthma, uncomplicated: Secondary | ICD-10-CM | POA: Diagnosis not present

## 2017-07-21 DIAGNOSIS — E872 Acidosis, unspecified: Secondary | ICD-10-CM

## 2017-07-21 DIAGNOSIS — R000 Tachycardia, unspecified: Secondary | ICD-10-CM | POA: Diagnosis not present

## 2017-07-21 DIAGNOSIS — E876 Hypokalemia: Secondary | ICD-10-CM | POA: Diagnosis present

## 2017-07-21 DIAGNOSIS — Z6832 Body mass index (BMI) 32.0-32.9, adult: Secondary | ICD-10-CM | POA: Diagnosis not present

## 2017-07-21 DIAGNOSIS — Z6829 Body mass index (BMI) 29.0-29.9, adult: Secondary | ICD-10-CM | POA: Diagnosis not present

## 2017-07-21 DIAGNOSIS — T50901A Poisoning by unspecified drugs, medicaments and biological substances, accidental (unintentional), initial encounter: Secondary | ICD-10-CM | POA: Diagnosis present

## 2017-07-21 DIAGNOSIS — R569 Unspecified convulsions: Secondary | ICD-10-CM | POA: Diagnosis not present

## 2017-07-21 DIAGNOSIS — Z915 Personal history of self-harm: Secondary | ICD-10-CM

## 2017-07-21 DIAGNOSIS — Z87891 Personal history of nicotine dependence: Secondary | ICD-10-CM | POA: Diagnosis not present

## 2017-07-21 DIAGNOSIS — Z91030 Bee allergy status: Secondary | ICD-10-CM | POA: Diagnosis not present

## 2017-07-21 DIAGNOSIS — T43592A Poisoning by other antipsychotics and neuroleptics, intentional self-harm, initial encounter: Secondary | ICD-10-CM | POA: Diagnosis not present

## 2017-07-21 DIAGNOSIS — Z148 Genetic carrier of other disease: Secondary | ICD-10-CM | POA: Diagnosis not present

## 2017-07-21 DIAGNOSIS — G43909 Migraine, unspecified, not intractable, without status migrainosus: Secondary | ICD-10-CM | POA: Diagnosis not present

## 2017-07-21 DIAGNOSIS — E669 Obesity, unspecified: Secondary | ICD-10-CM | POA: Diagnosis present

## 2017-07-21 DIAGNOSIS — T426X2A Poisoning by other antiepileptic and sedative-hypnotic drugs, intentional self-harm, initial encounter: Secondary | ICD-10-CM | POA: Diagnosis present

## 2017-07-21 DIAGNOSIS — G40909 Epilepsy, unspecified, not intractable, without status epilepticus: Secondary | ICD-10-CM | POA: Diagnosis not present

## 2017-07-21 DIAGNOSIS — E221 Hyperprolactinemia: Secondary | ICD-10-CM | POA: Diagnosis not present

## 2017-07-21 DIAGNOSIS — F603 Borderline personality disorder: Secondary | ICD-10-CM | POA: Diagnosis present

## 2017-07-21 DIAGNOSIS — F4310 Post-traumatic stress disorder, unspecified: Secondary | ICD-10-CM | POA: Diagnosis not present

## 2017-07-21 DIAGNOSIS — T50902D Poisoning by unspecified drugs, medicaments and biological substances, intentional self-harm, subsequent encounter: Secondary | ICD-10-CM | POA: Diagnosis not present

## 2017-07-21 DIAGNOSIS — F29 Unspecified psychosis not due to a substance or known physiological condition: Secondary | ICD-10-CM | POA: Diagnosis not present

## 2017-07-21 DIAGNOSIS — F419 Anxiety disorder, unspecified: Secondary | ICD-10-CM | POA: Diagnosis not present

## 2017-07-21 DIAGNOSIS — R45851 Suicidal ideations: Secondary | ICD-10-CM | POA: Diagnosis not present

## 2017-07-21 DIAGNOSIS — Z9102 Food additives allergy status: Secondary | ICD-10-CM | POA: Diagnosis not present

## 2017-07-21 DIAGNOSIS — T50902A Poisoning by unspecified drugs, medicaments and biological substances, intentional self-harm, initial encounter: Secondary | ICD-10-CM | POA: Diagnosis not present

## 2017-07-21 LAB — CBC WITH DIFFERENTIAL/PLATELET
ABS IMMATURE GRANULOCYTES: 0 10*3/uL (ref 0.0–0.1)
Basophils Absolute: 0 10*3/uL (ref 0.0–0.1)
Basophils Relative: 0 %
Eosinophils Absolute: 0.1 10*3/uL (ref 0.0–0.7)
Eosinophils Relative: 1 %
HEMATOCRIT: 38.2 % (ref 36.0–46.0)
Hemoglobin: 11.8 g/dL — ABNORMAL LOW (ref 12.0–15.0)
IMMATURE GRANULOCYTES: 0 %
LYMPHS ABS: 2.6 10*3/uL (ref 0.7–4.0)
Lymphocytes Relative: 39 %
MCH: 27.6 pg (ref 26.0–34.0)
MCHC: 30.9 g/dL (ref 30.0–36.0)
MCV: 89.3 fL (ref 78.0–100.0)
MONO ABS: 0.4 10*3/uL (ref 0.1–1.0)
Monocytes Relative: 7 %
NEUTROS ABS: 3.6 10*3/uL (ref 1.7–7.7)
NEUTROS PCT: 53 %
Platelets: 166 10*3/uL (ref 150–400)
RBC: 4.28 MIL/uL (ref 3.87–5.11)
RDW: 12.5 % (ref 11.5–15.5)
WBC: 6.8 10*3/uL (ref 4.0–10.5)

## 2017-07-21 LAB — I-STAT VENOUS BLOOD GAS, ED
Acid-base deficit: 6 mmol/L — ABNORMAL HIGH (ref 0.0–2.0)
BICARBONATE: 18.7 mmol/L — AB (ref 20.0–28.0)
O2 SAT: 100 %
PO2 VEN: 191 mmHg — AB (ref 32.0–45.0)
TCO2: 20 mmol/L — AB (ref 22–32)
pCO2, Ven: 32.2 mmHg — ABNORMAL LOW (ref 44.0–60.0)
pH, Ven: 7.372 (ref 7.250–7.430)

## 2017-07-21 MED ORDER — CHARCOAL ACTIVATED PO LIQD
1.0000 g/kg | Freq: Once | ORAL | Status: AC
  Administered 2017-07-21: 80.3 g via ORAL
  Filled 2017-07-21: qty 480

## 2017-07-21 MED ORDER — SODIUM CHLORIDE 0.9 % IV BOLUS
1000.0000 mL | Freq: Once | INTRAVENOUS | Status: AC
  Administered 2017-07-21: 1000 mL via INTRAVENOUS

## 2017-07-21 MED ORDER — ACETYLCYSTEINE LOAD VIA INFUSION
150.0000 mg/kg | Freq: Once | INTRAVENOUS | Status: DC
  Filled 2017-07-21 (×2): qty 302

## 2017-07-21 MED ORDER — ACETYLCYSTEINE LOAD VIA INFUSION
150.0000 mg/kg | Freq: Once | INTRAVENOUS | Status: AC
  Administered 2017-07-22: 12045 mg via INTRAVENOUS
  Filled 2017-07-21: qty 302

## 2017-07-21 MED ORDER — DEXTROSE 5 % IV SOLN
15.0000 mg/kg/h | INTRAVENOUS | Status: DC
  Administered 2017-07-22: 15 mg/kg/h via INTRAVENOUS
  Filled 2017-07-21 (×3): qty 200

## 2017-07-21 NOTE — ED Notes (Signed)
Spoke with Ebony Hail from poison control.  Recommendations as follows.  1gm/kg activated charcoal, repeat tylenol level at 4 hours post ingestion (0115)  Observe for 6 hours until asymptomatic and has normal vs.  Replace any potassium and magnesium to the upper limits of normal.  Give IV fluids for tachycardia, benzos for any CNS depression.

## 2017-07-21 NOTE — ED Provider Notes (Signed)
[facility] EMERGENCY DEPARTMENT Provider Note   CSN: [id number] Arrival date & time: 07/21/17  2222    History   Chief Complaint Chief Complaint  Patient presents with  . Ingestion    HPI [name] is a 27 y.o. female.   28 y/o female with hx of depression, DSB, schizo-affective disorder, seizures presents to the ED for overdose.  Patient alleges taking 60 tablets of 500mg  Tylenol, ~40-50 tablets of 2mg  Risperdal, 30 tablets of 100mg  Zoloft, 30 tablets of 100mg  Topamax, 40 tablets of 500mg  Keppra. States ingestion was 1 hour PTA (~2130). States she has been experiencing suicidal thoughts x 3 days. Hx of overdose. Patient called EMS after ingestion c/o dizziness; no medications given en route. No associated syncope, abdominal pain, nausea, vomiting prior to arrival. No HI or reported ETOH use or illicit drug use.     Past Medical History:  Diagnosis Date  . Acid reflux   . Asthma    last attack 03/13/15 or 03/14/15  . Autism   . 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 Chronic constipation   . Depression   . Drug-seeking behavior   . Essential tremor   . Headache   . Personality disorder ([place])   . Schizo-affective psychosis ([place])   . Schizoaffective disorder, bipolar type ([place])   . Seizures [facility])    Last seizure December 2017    Patient Active Problem List   Diagnosis Date Noted  . Pancreatitis, acute 06/30/2017  . Acute pancreatitis 06/30/2017  . DUB (dysfunctional uterine bleeding) 11/22/2016  . Prolactinoma ([place]) 08/20/2016  . Contraceptive management 08/09/2016  . Tachycardia with heart rate 121-140 beats per minute 01/13/2016  . 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  . Seizure disorder ([place]) 08/08/2015  . Chronic migraine 07/27/2015  . Asthma 04/15/2015  . Schizoaffective disorder, bipolar type by history(HCC) 03/10/2014  . Borderline personality disorder ([place]) 03/10/2014  . PTSD (post-traumatic stress disorder) 03/10/2014  . Autism  spectrum disorder 06/15/2013    Past Surgical History:  Procedure Laterality Date  . MOUTH SURGERY  2009 or 2010     OB History    Gravida  2   Para  1   Term  1   Preterm      AB  1   Living  1     SAB  1   TAB      Ectopic      Multiple  0   Live Births  1            Home Medications    Prior to Admission medications   Medication Sig Start Date End Date Taking? Authorizing Provider  acetaminophen (TYLENOL) 500 MG tablet Take 1,000 mg by mouth every 6 (six) hours as needed for mild pain, moderate pain or headache.    [provider]  albuterol (PROVENTIL HFA;VENTOLIN HFA) 108 (90 Base) MCG/ACT inhaler Inhale 1-2 puffs into the lungs every 6 (six) hours as needed for wheezing or shortness of breath.    [provider]  b complex vitamins tablet Take 1 tablet by mouth daily.    [provider]  cabergoline (DOSTINEX) 0.5 MG tablet Take 0.5 tablets (0.25 mg total) by mouth 2 (two) times a week. Patient not taking: Reported on 06/30/2017 08/20/16   Chancy Milroy, MD  Coenzyme Q10 (COQ-10) 100 MG CAPS Take 1 tablet by mouth daily.    [provider]  fluticasone-salmeterol (ADVAIR HFA) 416-60 MCG/ACT inhaler Inhale  2 puffs into the lungs 2 (two) times daily. 08/03/15   Timmothy Euler, MD  HYDROcodone-acetaminophen (NORCO/VICODIN) 5-325 MG tablet Take 1 tablet by mouth every 6 (six) hours as needed for severe pain. 07/14/17   Carmin Muskrat, MD  levETIRAcetam (KEPPRA) 500 MG tablet TAKE 1 TABLET BY MOUTH TWICE A DAY 05/06/17   Pieter Partridge, DO  levonorgestrel-ethinyl estradiol (JOLESSA) 0.15-0.03 MG tablet Take 1 tablet by mouth daily. Patient not taking: Reported on 06/30/2017 08/09/16   Chancy Milroy, MD  magnesium gluconate (MAGONATE) 500 MG tablet Take 500 mg by mouth daily.    [provider]  Multiple Vitamin (MULTIVITAMIN) tablet Take 1 tablet by mouth daily.    [provider]  ondansetron (ZOFRAN ODT)  4 MG disintegrating tablet Take 1 tablet (4 mg total) by mouth every 8 (eight) hours as needed for nausea or vomiting. 07/14/17   Carmin Muskrat, MD  risperiDONE (RISPERDAL) 2 MG tablet Take 2 mg by mouth 2 (two) times daily.     [provider]  sertraline (ZOLOFT) 100 MG tablet Take 100 mg by mouth daily.    [provider]  topiramate (TOPAMAX) 100 MG tablet Take 1 tablet (100 mg total) by mouth at bedtime. 09/04/16   Pieter Partridge, DO    Family History Family History  Problem Relation Age of Onset  . Mental illness Father   . Asthma Father   . PDD Brother   . Seizures Brother     Social History Social History   Tobacco Use  . Smoking status: Former Smoker    Packs/day: 0.00  . Smokeless tobacco: Never Used  . Tobacco comment: Smoked for 2  years age [age]  Substance Use Topics  . Alcohol use: No    Alcohol/week: 0.6 oz    Types: 1 Standard drinks or equivalent per week  . Drug use: No    Comment: History of cocaine use at age 56 for 4 months     Allergies   Bee venom; Coconut flavor; Coconut oil; Geodon [ziprasidone hcl]; Haloperidol and related; Lithium; Oxycodone; Quetiapine fumarate; Seroquel [quetiapine fumarate]; Shellfish allergy; Percocet [oxycodone-acetaminophen]; Phenergan [promethazine hcl]; Prilosec [omeprazole]; Sulfa antibiotics; and Tegretol [carbamazepine]   Review of Systems Review of Systems Ten systems reviewed and are negative for acute change, except as noted in the HPI.    Physical Exam Updated Vital Signs BP 102/68   Pulse 97   Temp 98 F (36.7 C) (Oral)   Resp (!) 24   Ht 5\' 4"  (1.626 m)   Wt 80.3 kg (177 lb)   SpO2 99%   BMI 30.38 kg/m   Physical Exam  Constitutional: She is oriented to person, place, and time. She appears well-developed and well-nourished. No distress.  Nontoxic appearing.  Sleepy.  HENT:  Head: Normocephalic and atraumatic.  Eyes: Conjunctivae and EOM are normal. No scleral icterus.  Neck:  Normal range of motion.  Cardiovascular: Normal rate, regular rhythm and intact distal pulses.  Pulmonary/Chest: Effort normal. No respiratory distress.  Respirations even and unlabored  Musculoskeletal: Normal range of motion.  Neurological: She is alert and oriented to person, place, and time. She exhibits normal muscle tone. Coordination normal.  GCS 15.  Speech is goal oriented.  Moving all extremities spontaneously.  Skin: Skin is warm and dry. No rash noted. She is not diaphoretic. No erythema. No pallor.  Psychiatric: She has a normal mood and affect. Her behavior is normal.  Nursing note and vitals reviewed.  ED Treatments / Results  Labs (all labs ordered are listed, but only abnormal results are displayed) Labs Reviewed  CBC WITH DIFFERENTIAL/PLATELET - Abnormal; Notable for the following components:      Result Value   Hemoglobin 11.8 (*)    All other components within normal limits  COMPREHENSIVE METABOLIC PANEL - Abnormal; Notable for the following components:   Potassium 3.3 (*)    Chloride 112 (*)    CO2 19 (*)    Glucose, Bld 104 (*)    Calcium 8.6 (*)    Total Protein 6.3 (*)    All other components within normal limits  ACETAMINOPHEN LEVEL - Abnormal; Notable for the following components:   Acetaminophen (Tylenol), Serum 175 (*)    All other components within normal limits  RAPID URINE DRUG SCREEN, HOSP PERFORMED - Abnormal; Notable for the following components:   Barbiturates   (*)    Value: Result not available. Reagent lot number recalled by manufacturer.   All other components within normal limits  MAGNESIUM - Abnormal; Notable for the following components:   Magnesium 1.6 (*)    All other components within normal limits  I-STAT VENOUS BLOOD GAS, ED - Abnormal; Notable for the following components:   pCO2, Ven 32.2 (*)    pO2, Ven 191.0 (*)    Bicarbonate 18.7 (*)    TCO2 20 (*)    Acid-base deficit 6.0 (*)    All other components within normal  limits  SALICYLATE LEVEL  ETHANOL  PREGNANCY, URINE  ACETAMINOPHEN LEVEL    EKG EKG Interpretation  Date/Time:  Sunday July 21 2017 22:26:46 EDT Ventricular Rate:  95 PR Interval:    QRS Duration: 93 QT Interval:  395 QTC Calculation: 497 R Axis:   31 Text Interpretation:  Sinus rhythm Prolonged QT interval When compared with ECG of 07/14/2017, QT has lengthened Confirmed by Delora Fuel ([id number]) on 07/21/2017 11:20:16 PM   Radiology No results found.  Procedures Procedures (including critical care time)  Medications Ordered in ED Medications  acetylcysteine (ACETADOTE) 40 mg/mL load via infusion 12,045 mg (12,045 mg Intravenous Bolus from Bag 07/22/17 0047)    Followed by  acetylcysteine (ACETADOTE) 40,000 mg in dextrose 5 % 1,000 mL (40 mg/mL) infusion (15 mg/kg/hr  80.3 kg Intravenous New Bag/Given 07/22/17 0054)  sodium chloride 0.9 % bolus 1,000 mL (0 mLs Intravenous Stopped 07/21/17 2341)  charcoal activated (NO SORBITOL) (ACTIDOSE-AQUA) suspension 80.3 g (80.3 g Oral Given 07/21/17 2307)  sodium chloride 0.9 % bolus 1,000 mL (0 mLs Intravenous Stopped 07/22/17 0034)  sodium chloride 0.9 % bolus 1,000 mL (0 mLs Intravenous Stopped 07/22/17 0216)     CRITICAL CARE Performed by: Antonietta Breach   Total critical care time: 60 minutes  Critical care time was exclusive of separately billable procedures and treating other patients.  Critical care was necessary to treat or prevent imminent or life-threatening deterioration.  Critical care was time spent personally by me on the following activities: development of treatment plan with patient and/or surrogate as well as nursing, discussions with consultants, evaluation of patient's response to treatment, examination of patient, obtaining history from patient or surrogate, ordering and performing treatments and interventions, ordering and review of laboratory studies, ordering and review of radiographic studies, pulse oximetry and  re-evaluation of patient's condition.   3:26 AM Repeat tylenol level is 239.4. Will consult [facility] for admission to SDU. Patient hemodynamically stable.   Initial Impression / Assessment and Plan / ED Course  I have reviewed  the triage vital signs and the nursing notes.  Pertinent labs & imaging results that were available during my care of the patient were reviewed by me and considered in my medical decision making (see chart for details).     27 year old female presents to the emergency department after a suicide attempt by overdose.  She reports taking 60 tablets of 500 mg Tylenol at 2130.  Tylenol level trending upward.  The patient was started on acetylcysteine on arrival.  She has been hemodynamically stable following administration of IV fluids.  Will admit for continued observation and management of overdose.  Consult placed to [facility].  3:35 AM Case discussed with Dr. Hal Hope who will assess for admission.  Vitals:   07/22/17 0145 07/22/17 0200 07/22/17 0230 07/22/17 0245  BP: 103/66 102/65 99/64 102/68  Pulse: (!) 117 88 93 97  Resp: (!) 24 18 20  (!) 24  Temp:      TempSrc:      SpO2: 98% 98% 100% 99%  Weight:      Height:        Final Clinical Impressions(s) / ED Diagnoses   Final diagnoses:  Intentional acetaminophen overdose, initial encounter [facility])  Suicide attempt [facility])    ED Discharge Orders    None       Antonietta Breach, PA-C 07/22/17 0336    Orpah Greek, MD 07/22/17 [phone]

## 2017-07-21 NOTE — ED Triage Notes (Addendum)
Brought by ems for overdose.  Reports taking tylenol 30,000mg , risperdone 64mg , sertraline 1600mg , and topamax 1600mg  and keppra 16000mg .   Ambulatory on arrival.  States I feel dizzy.

## 2017-07-21 NOTE — ED Provider Notes (Signed)
Patient presents to the emergency department after intentional overdose.  Patient reports taking 30 g of Tylenol, 64 mg of risperidone, 1600 mg of sertraline and 1600 mg of Topamax approximately 2 hours ago.  Face to face Exam: HEENT - PERRLA Lungs - CTAB Heart - RRR, no M/R/G Abd - S/NT/ND Neuro - alert, oriented x3  Plan: Based on stated dose of Tylenol, will initiate Acetadote empirically and perform level testing to confirm need for continuation.    Orpah Greek, MD 07/21/17 2337

## 2017-07-22 ENCOUNTER — Encounter (HOSPITAL_COMMUNITY): Payer: Self-pay | Admitting: Internal Medicine

## 2017-07-22 DIAGNOSIS — F250 Schizoaffective disorder, bipolar type: Secondary | ICD-10-CM | POA: Diagnosis present

## 2017-07-22 DIAGNOSIS — E872 Acidosis, unspecified: Secondary | ICD-10-CM

## 2017-07-22 DIAGNOSIS — T50901A Poisoning by unspecified drugs, medicaments and biological substances, accidental (unintentional), initial encounter: Secondary | ICD-10-CM

## 2017-07-22 DIAGNOSIS — Z6832 Body mass index (BMI) 32.0-32.9, adult: Secondary | ICD-10-CM | POA: Diagnosis not present

## 2017-07-22 DIAGNOSIS — T43222A Poisoning by selective serotonin reuptake inhibitors, intentional self-harm, initial encounter: Secondary | ICD-10-CM | POA: Diagnosis present

## 2017-07-22 DIAGNOSIS — F603 Borderline personality disorder: Secondary | ICD-10-CM | POA: Diagnosis present

## 2017-07-22 DIAGNOSIS — T43592A Poisoning by other antipsychotics and neuroleptics, intentional self-harm, initial encounter: Secondary | ICD-10-CM | POA: Diagnosis present

## 2017-07-22 DIAGNOSIS — Z148 Genetic carrier of other disease: Secondary | ICD-10-CM | POA: Diagnosis not present

## 2017-07-22 DIAGNOSIS — Z915 Personal history of self-harm: Secondary | ICD-10-CM | POA: Diagnosis not present

## 2017-07-22 DIAGNOSIS — T1491XA Suicide attempt, initial encounter: Secondary | ICD-10-CM | POA: Diagnosis present

## 2017-07-22 DIAGNOSIS — Z825 Family history of asthma and other chronic lower respiratory diseases: Secondary | ICD-10-CM | POA: Diagnosis not present

## 2017-07-22 DIAGNOSIS — T50902A Poisoning by unspecified drugs, medicaments and biological substances, intentional self-harm, initial encounter: Secondary | ICD-10-CM | POA: Diagnosis not present

## 2017-07-22 DIAGNOSIS — T50902D Poisoning by unspecified drugs, medicaments and biological substances, intentional self-harm, subsequent encounter: Secondary | ICD-10-CM | POA: Diagnosis not present

## 2017-07-22 DIAGNOSIS — Z818 Family history of other mental and behavioral disorders: Secondary | ICD-10-CM | POA: Diagnosis not present

## 2017-07-22 DIAGNOSIS — T426X2A Poisoning by other antiepileptic and sedative-hypnotic drugs, intentional self-harm, initial encounter: Secondary | ICD-10-CM | POA: Diagnosis present

## 2017-07-22 DIAGNOSIS — T391X2A Poisoning by 4-Aminophenol derivatives, intentional self-harm, initial encounter: Secondary | ICD-10-CM | POA: Insufficient documentation

## 2017-07-22 DIAGNOSIS — Z87891 Personal history of nicotine dependence: Secondary | ICD-10-CM | POA: Diagnosis not present

## 2017-07-22 DIAGNOSIS — E876 Hypokalemia: Secondary | ICD-10-CM | POA: Diagnosis present

## 2017-07-22 DIAGNOSIS — G40909 Epilepsy, unspecified, not intractable, without status epilepticus: Secondary | ICD-10-CM | POA: Diagnosis present

## 2017-07-22 DIAGNOSIS — E669 Obesity, unspecified: Secondary | ICD-10-CM | POA: Diagnosis present

## 2017-07-22 DIAGNOSIS — I959 Hypotension, unspecified: Secondary | ICD-10-CM | POA: Diagnosis present

## 2017-07-22 HISTORY — DX: Poisoning by unspecified drugs, medicaments and biological substances, accidental (unintentional), initial encounter: T50.901A

## 2017-07-22 LAB — BASIC METABOLIC PANEL
ANION GAP: 7 (ref 5–15)
ANION GAP: 6 (ref 5–15)
Anion gap: 8 (ref 5–15)
BUN: 5 mg/dL — ABNORMAL LOW (ref 6–20)
BUN: <5 mg/dL — ABNORMAL LOW (ref 6–20)
CALCIUM: 8 mg/dL — AB (ref 8.9–10.3)
CALCIUM: 8.4 mg/dL — AB (ref 8.9–10.3)
CHLORIDE: 120 mmol/L — AB (ref 101–111)
CO2: 15 mmol/L — AB (ref 22–32)
CO2: 16 mmol/L — ABNORMAL LOW (ref 22–32)
CO2: 17 mmol/L — ABNORMAL LOW (ref 22–32)
CREATININE: 0.63 mg/dL (ref 0.44–1.00)
CREATININE: 0.64 mg/dL (ref 0.44–1.00)
Calcium: 7.7 mg/dL — ABNORMAL LOW (ref 8.9–10.3)
Chloride: 120 mmol/L — ABNORMAL HIGH (ref 101–111)
Chloride: 122 mmol/L — ABNORMAL HIGH (ref 101–111)
Creatinine, Ser: 0.75 mg/dL (ref 0.44–1.00)
GFR calc Af Amer: >60 mL/min (ref >60)
GFR calc Af Amer: >60 mL/min (ref >60)
GFR calc non Af Amer: >60 mL/min (ref >60)
GLUCOSE: 117 mg/dL — AB (ref 65–99)
GLUCOSE: 139 mg/dL — AB (ref 65–99)
Glucose, Bld: 122 mg/dL — ABNORMAL HIGH (ref 65–99)
POTASSIUM: 3.4 mmol/L — AB (ref 3.5–5.1)
Potassium: 3.7 mmol/L (ref 3.5–5.1)
Potassium: 3.7 mmol/L (ref 3.5–5.1)
Sodium: 143 mmol/L (ref 135–145)
Sodium: 143 mmol/L (ref 135–145)
Sodium: 145 mmol/L (ref 135–145)

## 2017-07-22 LAB — COMPREHENSIVE METABOLIC PANEL
ALBUMIN: 3.9 g/dL (ref 3.5–5.0)
ALT: 16 U/L (ref 14–54)
ALT: 13 U/L — AB (ref 14–54)
ANION GAP: 6 (ref 5–15)
AST: 16 U/L (ref 15–41)
AST: 13 U/L — ABNORMAL LOW (ref 15–41)
Albumin: 3.2 g/dL — ABNORMAL LOW (ref 3.5–5.0)
Alkaline Phosphatase: 60 U/L (ref 38–126)
Alkaline Phosphatase: 48 U/L (ref 38–126)
Anion gap: 9 (ref 5–15)
BUN: 11 mg/dL (ref 6–20)
BUN: 6 mg/dL (ref 6–20)
CALCIUM: 8.6 mg/dL — AB (ref 8.9–10.3)
CALCIUM: 8 mg/dL — AB (ref 8.9–10.3)
CHLORIDE: 112 mmol/L — AB (ref 101–111)
CHLORIDE: 120 mmol/L — AB (ref 101–111)
CO2: 19 mmol/L — ABNORMAL LOW (ref 22–32)
CO2: 18 mmol/L — ABNORMAL LOW (ref 22–32)
CREATININE: 0.73 mg/dL (ref 0.44–1.00)
CREATININE: 0.67 mg/dL (ref 0.44–1.00)
GFR calc Af Amer: >60 mL/min (ref >60)
GFR calc non Af Amer: >60 mL/min (ref >60)
GLUCOSE: 104 mg/dL — AB (ref 65–99)
Glucose, Bld: 111 mg/dL — ABNORMAL HIGH (ref 65–99)
POTASSIUM: 3.3 mmol/L — AB (ref 3.5–5.1)
Potassium: 3.5 mmol/L (ref 3.5–5.1)
SODIUM: 144 mmol/L (ref 135–145)
Sodium: 140 mmol/L (ref 135–145)
TOTAL PROTEIN: 6.3 g/dL — AB (ref 6.5–8.1)
Total Bilirubin: 0.4 mg/dL (ref 0.3–1.2)
Total Bilirubin: 0.3 mg/dL (ref 0.3–1.2)
Total Protein: 5.6 g/dL — ABNORMAL LOW (ref 6.5–8.1)

## 2017-07-22 LAB — I-STAT ARTERIAL BLOOD GAS, ED
Acid-base deficit: 12 mmol/L — ABNORMAL HIGH (ref 0.0–2.0)
Bicarbonate: 14 mmol/L — ABNORMAL LOW (ref 20.0–28.0)
O2 SAT: 97 %
PCO2 ART: 30.5 mmHg — AB (ref 32.0–48.0)
PH ART: 7.269 — AB (ref 7.350–7.450)
PO2 ART: 100 mmHg (ref 83.0–108.0)
Patient temperature: 97.6
TCO2: 15 mmol/L — ABNORMAL LOW (ref 22–32)

## 2017-07-22 LAB — PROTIME-INR
INR: 1.41
INR: 1.37
INR: 1.36
INR: 1.34
PROTHROMBIN TIME: 16.7 s — AB (ref 11.4–15.2)
PROTHROMBIN TIME: 16.7 s — AB (ref 11.4–15.2)
PROTHROMBIN TIME: 16.5 s — AB (ref 11.4–15.2)
Prothrombin Time: 17.1 seconds — ABNORMAL HIGH (ref 11.4–15.2)

## 2017-07-22 LAB — RAPID URINE DRUG SCREEN, HOSP PERFORMED
Amphetamines: NOT DETECTED
Benzodiazepines: NOT DETECTED
Cocaine: NOT DETECTED
OPIATES: NOT DETECTED
Tetrahydrocannabinol: NOT DETECTED

## 2017-07-22 LAB — GLUCOSE, CAPILLARY
GLUCOSE-CAPILLARY: 116 mg/dL — AB (ref 65–99)
GLUCOSE-CAPILLARY: 103 mg/dL — AB (ref 65–99)
Glucose-Capillary: 104 mg/dL — ABNORMAL HIGH (ref 65–99)
Glucose-Capillary: 114 mg/dL — ABNORMAL HIGH (ref 65–99)

## 2017-07-22 LAB — PHOSPHORUS: Phosphorus: 2.3 mg/dL — ABNORMAL LOW (ref 2.5–4.6)

## 2017-07-22 LAB — HEPATIC FUNCTION PANEL
ALBUMIN: 3.3 g/dL — AB (ref 3.5–5.0)
ALK PHOS: 52 U/L (ref 38–126)
ALT: 15 U/L (ref 14–54)
ALT: 14 U/L (ref 14–54)
ALT: 13 U/L — AB (ref 14–54)
AST: 14 U/L — AB (ref 15–41)
AST: 14 U/L — ABNORMAL LOW (ref 15–41)
AST: 15 U/L (ref 15–41)
Albumin: 3.4 g/dL — ABNORMAL LOW (ref 3.5–5.0)
Albumin: 3.3 g/dL — ABNORMAL LOW (ref 3.5–5.0)
Alkaline Phosphatase: 51 U/L (ref 38–126)
Alkaline Phosphatase: 51 U/L (ref 38–126)
BILIRUBIN TOTAL: 0.2 mg/dL — AB (ref 0.3–1.2)
Bilirubin, Direct: <0.1 mg/dL — ABNORMAL LOW (ref 0.1–0.5)
TOTAL PROTEIN: 5.8 g/dL — AB (ref 6.5–8.1)
Total Bilirubin: 0.3 mg/dL (ref 0.3–1.2)
Total Bilirubin: 0.4 mg/dL (ref 0.3–1.2)
Total Protein: 6 g/dL — ABNORMAL LOW (ref 6.5–8.1)
Total Protein: 5.7 g/dL — ABNORMAL LOW (ref 6.5–8.1)

## 2017-07-22 LAB — ACETAMINOPHEN LEVEL
ACETAMINOPHEN (TYLENOL), SERUM: 175 ug/mL — AB (ref 10–30)
ACETAMINOPHEN (TYLENOL), SERUM: 239 ug/mL — AB (ref 10–30)
ACETAMINOPHEN (TYLENOL), SERUM: 10 ug/mL (ref 10–30)

## 2017-07-22 LAB — LACTIC ACID, PLASMA
LACTIC ACID, VENOUS: 1.4 mmol/L (ref 0.5–1.9)
Lactic Acid, Venous: 1.8 mmol/L (ref 0.5–1.9)

## 2017-07-22 LAB — CBG MONITORING, ED: GLUCOSE-CAPILLARY: 107 mg/dL — AB (ref 65–99)

## 2017-07-22 LAB — MAGNESIUM
Magnesium: 1.6 mg/dL — ABNORMAL LOW (ref 1.7–2.4)
Magnesium: 2.1 mg/dL (ref 1.7–2.4)

## 2017-07-22 LAB — TROPONIN I

## 2017-07-22 LAB — SALICYLATE LEVEL: Salicylate Lvl: <7 mg/dL (ref 2.8–30.0)

## 2017-07-22 LAB — ETHANOL

## 2017-07-22 LAB — PREGNANCY, URINE: PREG TEST UR: NEGATIVE

## 2017-07-22 LAB — MRSA PCR SCREENING: MRSA BY PCR: NEGATIVE

## 2017-07-22 MED ORDER — POTASSIUM PHOSPHATE MONOBASIC 500 MG PO TABS
500.0000 mg | ORAL_TABLET | Freq: Three times a day (TID) | ORAL | Status: DC
  Filled 2017-07-22 (×2): qty 1

## 2017-07-22 MED ORDER — DEXTROSE 5 % IV SOLN
20.0000 mmol | Freq: Once | INTRAVENOUS | Status: DC
  Filled 2017-07-22: qty 6.67

## 2017-07-22 MED ORDER — SODIUM CHLORIDE 0.9 % IV BOLUS
1000.0000 mL | Freq: Once | INTRAVENOUS | Status: AC
  Administered 2017-07-22: 1000 mL via INTRAVENOUS

## 2017-07-22 MED ORDER — DEXTROSE IN LACTATED RINGERS 5 % IV SOLN
INTRAVENOUS | Status: DC
  Administered 2017-07-22 – 2017-07-23 (×3): via INTRAVENOUS
  Filled 2017-07-22 (×2): qty 1000

## 2017-07-22 MED ORDER — K PHOS MONO-SOD PHOS DI & MONO 155-852-130 MG PO TABS
500.0000 mg | ORAL_TABLET | Freq: Three times a day (TID) | ORAL | Status: AC
  Administered 2017-07-22 – 2017-07-23 (×3): 500 mg via ORAL
  Filled 2017-07-22 (×3): qty 2

## 2017-07-22 MED ORDER — MAGNESIUM SULFATE 2 GM/50ML IV SOLN
2.0000 g | Freq: Once | INTRAVENOUS | Status: AC
  Administered 2017-07-22: 2 g via INTRAVENOUS
  Filled 2017-07-22: qty 50

## 2017-07-22 MED ORDER — POTASSIUM CHLORIDE CRYS ER 20 MEQ PO TBCR
40.0000 meq | EXTENDED_RELEASE_TABLET | Freq: Once | ORAL | Status: AC
  Administered 2017-07-22: 40 meq via ORAL
  Filled 2017-07-22: qty 2

## 2017-07-22 MED ORDER — STERILE WATER FOR INJECTION IV SOLN
INTRAVENOUS | Status: AC
  Administered 2017-07-22 – 2017-07-23 (×3): via INTRAVENOUS
  Filled 2017-07-22 (×6): qty 850

## 2017-07-22 MED ORDER — LACTATED RINGERS IV BOLUS
1000.0000 mL | Freq: Once | INTRAVENOUS | Status: AC
  Administered 2017-07-22: 1000 mL via INTRAVENOUS

## 2017-07-22 MED ORDER — POTASSIUM CHLORIDE 10 MEQ/100ML IV SOLN
10.0000 meq | INTRAVENOUS | Status: AC
  Administered 2017-07-22 (×4): 10 meq via INTRAVENOUS
  Filled 2017-07-22 (×4): qty 100

## 2017-07-22 MED ORDER — POTASSIUM PHOSPHATES 15 MMOLE/5ML IV SOLN
10.0000 mmol | Freq: Once | INTRAVENOUS | Status: AC
  Administered 2017-07-22: 10 mmol via INTRAVENOUS
  Filled 2017-07-22: qty 3.33

## 2017-07-22 MED ORDER — SODIUM CHLORIDE 0.9 % IV SOLN
INTRAVENOUS | Status: DC
  Administered 2017-07-22: 06:00:00 via INTRAVENOUS

## 2017-07-22 NOTE — Progress Notes (Signed)
Lab review in pm  - all ok but for mild low phos   Plan Replete k phos Continue INR and LFT monitoring   Dr. Brand Males, M.D., [facility].C.P Pulmonary and Critical Care Medicine Staff Physician, [place] Director - Interstitial Lung Disease  Program  Pulmonary [place] at [place], [place], [address]  Pager: [phone], If no answer or between  15:00h - 7:00h: call [phone] Telephone: [place]  Lab 07/21/17 2322 07/22/17 0612  PHART  --  7.269*  PCO2ART  --  30.5*  PO2ART  --  100.0  HCO3 18.7* 14.0*  TCO2 20* 15*  O2SAT 100.0 97.0    CBC Recent Labs  Lab 07/21/17 2302  HGB 11.8*  HCT 38.2  WBC 6.8  PLT 166    COAGULATION Recent Labs  Lab 07/22/17 0943 07/22/17 1230  INR 1.41 1.37    CARDIAC   Recent Labs  Lab 07/22/17 0732  TROPONINI <0.03   No results for input(s): PROBNP in the last 168 hours.   CHEMISTRY Recent Labs  Lab 07/21/17 2302 07/22/17 0134 07/22/17 0610 07/22/17 1230  NA 140  --  143 143  K 3.3*  --  3.4* 3.7  CL 112*  --  120* 120*  CO2 19*  --  15* 16*  GLUCOSE 104*  --  122* 117*  BUN 11  --  5* <5*  CREATININE 0.73  --  0.63 0.64  CALCIUM 8.6*  --  7.7* 8.0*  MG  --  1.6*  --  2.1  PHOS  --   --   --  2.3*   Estimated Creatinine Clearance: 107.3 mL/min (by C-G formula based on SCr of 0.64 mg/dL).   LIVER Recent Labs  Lab 07/21/17 2302 07/22/17 0943 07/22/17 1230  AST 16 14* 14*  ALT 16 15 14   ALKPHOS 60 51 52  BILITOT 0.4 0.3 0.2*  PROT 6.3* 6.0* 5.8*  ALBUMIN 3.9 3.3* 3.4*  INR  --  1.41 1.37     INFECTIOUS Recent Labs  Lab 07/22/17 0732 07/22/17 0943  LATICACIDVEN 1.4 1.8     ENDOCRINE CBG (last 3)  Recent Labs    07/22/17 0744 07/22/17 1220 07/22/17 1625  GLUCAP 107* 116* 103*         IMAGING x48h  - image(s) personally visualized  -   highlighted in bold No results  found.

## 2017-07-22 NOTE — Consult Note (Signed)
PULMONARY / CRITICAL CARE MEDICINE  Name: [name] MRN: [id number] DOB: 19-Jul-1990    ADMISSION DATE:  07/21/2017 CONSULTATION DATE:  07/22/17  REFERRING MD :  Hal Hope  CHIEF COMPLAINT:  Overdose   HISTORY OF PRESENT ILLNESS:  [name] is a 27 y.o. female with a PMH as outlined below.  She presented to [facility] ED night of 07/21/17 after family called EMS due to her feeling uneasy.  Pt has depression and apparently had suicide attempt with multiple pill ingestions at roughly 2130 that night.  Pills included 60 tabs of 500mg  Tylenol, ~40 - 50 tabs 2mg  Risperdal, 30 tabs 100mg  Zoloft, 30 tabs 100mg  Topamax, 40 tabs 500mg  Keppra.  In ED, she was borderline hypotensive and somewhat somnolent.  Poison control was contacted and recommended N-acetylcysteine as well as q4hr QTc and QRS monitoring, repeat LFT's and APAP levels in 24 hours.  If QRS is > 171ms then might need HCO3 pushes at dose of 2mg /kg until QRS normalizes.  She was initially admitted to SDU by [facility]; however, due to somnolence and metabolic acidosis (8.03 / 30 / 100), PCCM was asked to see in consultation.   PAST MEDICAL HISTORY :   has a past medical history of Acid reflux, Asthma, Autism, 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, Chronic constipation, Depression, Drug-seeking behavior, Essential tremor, Headache, Personality disorder ([place]), Schizo-affective psychosis ([place]), Schizoaffective disorder, bipolar type ([place]), and Seizures ([place]).  has a past surgical history that includes Mouth surgery (2009 or 2010). Prior to Admission medications   Medication Sig Start Date End Date Taking? Authorizing Provider  acetaminophen (TYLENOL) 500 MG tablet Take 1,000 mg by mouth every 6 (six) hours as needed for mild pain, moderate pain or headache.    [provider]  albuterol (PROVENTIL HFA;VENTOLIN HFA) 108 (90 Base) MCG/ACT inhaler Inhale 1-2 puffs into the lungs every 6 (six) hours as needed for wheezing or shortness of breath.     [provider]  b complex vitamins tablet Take 1 tablet by mouth daily.    [provider]  cabergoline (DOSTINEX) 0.5 MG tablet Take 0.5 tablets (0.25 mg total) by mouth 2 (two) times a week. Patient not taking: Reported on 06/30/2017 08/20/16   Chancy Milroy, MD  Coenzyme Q10 (COQ-10) 100 MG CAPS Take 1 tablet by mouth daily.    [provider]  fluticasone-salmeterol (ADVAIR HFA) 115-21 MCG/ACT inhaler Inhale 2 puffs into the lungs 2 (two) times daily. 08/03/15   Timmothy Euler, MD  HYDROcodone-acetaminophen (NORCO/VICODIN) 5-325 MG tablet Take 1 tablet by mouth every 6 (six) hours as needed for severe pain. 07/14/17   Carmin Muskrat, MD  levETIRAcetam (KEPPRA) 500 MG tablet TAKE 1 TABLET BY MOUTH TWICE A DAY 05/06/17   Pieter Partridge, DO  levonorgestrel-ethinyl estradiol (JOLESSA) 0.15-0.03 MG tablet Take 1 tablet by mouth daily. Patient not taking: Reported on 06/30/2017 08/09/16   Chancy Milroy, MD  magnesium gluconate (MAGONATE) 500 MG tablet Take 500 mg by mouth daily.    [provider]  Multiple Vitamin (MULTIVITAMIN) tablet Take 1 tablet by mouth daily.    [provider]  ondansetron (ZOFRAN ODT) 4 MG disintegrating tablet Take 1 tablet (4 mg total) by mouth every 8 (eight) hours as needed for nausea or vomiting. 07/14/17   Carmin Muskrat, MD  risperiDONE (RISPERDAL) 2 MG tablet Take 2 mg by mouth 2 (two) times daily.     [provider]  sertraline (ZOLOFT) 100 MG tablet Take 100  mg by mouth daily.    [provider]  topiramate (TOPAMAX) 100 MG tablet Take 1 tablet (100 mg total) by mouth at bedtime. 09/04/16   Pieter Partridge, DO   Allergies  Allergen Reactions  . Bee Venom Anaphylaxis  . Coconut Flavor Anaphylaxis  . Coconut Oil Anaphylaxis and Rash  . Geodon [Ziprasidone Hcl] Other (See Comments)    Pt states that this medication causes paralysis of the mouth.    . Haloperidol And Related Other (See Comments)     Pt states that this medication causes paralysis of the mouth.    . Lithium Other (See Comments)    Reaction:  Seizure-like activity.    . Oxycodone Other (See Comments)    Reaction:  Hallucinations   . Quetiapine Fumarate Other (See Comments)    TOO STRONG  . Seroquel [Quetiapine Fumarate] Other (See Comments)    Pt states that this medication is too strong.   . Shellfish Allergy Anaphylaxis  . Percocet [Oxycodone-Acetaminophen] Other (See Comments)    Reaction:  Hallucinations   . Phenergan [Promethazine Hcl] Other (See Comments)    Reaction:  Chest pain    . Prilosec [Omeprazole] Nausea And Vomiting  . Sulfa Antibiotics Other (See Comments)    Chest pain   . Tegretol [Carbamazepine] Nausea And Vomiting    FAMILY HISTORY:  family history includes Asthma in her father; Mental illness in her father; PDD in her brother; Seizures in her brother. SOCIAL HISTORY:  reports that she has quit smoking. She smoked 0.00 packs per day. She has never used smokeless tobacco. She reports that she does not drink alcohol or use drugs.  REVIEW OF SYSTEMS:   All negative; except for those that are bolded, which indicate positives.  Constitutional: weight loss, weight gain, night sweats, fevers, chills, fatigue, weakness.  HEENT: headaches, sore throat, sneezing, nasal congestion, post nasal drip, difficulty swallowing, tooth/dental problems, visual complaints, visual changes, ear aches. Neuro: difficulty with speech, weakness, numbness, ataxia. CV:  chest pain, orthopnea, PND, swelling in lower extremities, dizziness, palpitations, syncope.  Resp: cough, hemoptysis, dyspnea, wheezing. GI: heartburn, indigestion, abdominal pain, nausea, vomiting, diarrhea, constipation, change in bowel habits, loss of appetite, hematemesis, melena, hematochezia.  GU: dysuria, change in color of urine, urgency or frequency, flank pain, hematuria. MSK: joint pain or swelling, decreased range of motion. Psych: change  in mood or affect, depression, anxiety, suicidal ideations, homicidal ideations. Skin: rash, itching, bruising.   SUBJECTIVE: Vitals stable, no complaints.  She is sleepy but easily arouses to voice and answers questions appropriately.  VITAL SIGNS: Temp:  [98 F (36.7 C)] 98 F (36.7 C) (06/17 0833) Pulse Rate:  [68-140] 100 (06/17 0835) Resp:  [15-27] 24 (06/17 0835) BP: (84-133)/(43-90) 112/67 (06/17 0833) SpO2:  [94 %-100 %] 99 % (06/17 0835) Weight:  [80.3 kg (177 lb)] 80.3 kg (177 lb) (06/16 2233)  PHYSICAL EXAMINATION: General: Young female, resting in bed, in NAD. Neuro: Sleepy but easily arouses to voice.  A&O x 3 with no focal deficits. HEENT: /AT. Sclerae anicteric, EOMI. Cardiovascular: RRR, no M/R/G.  Lungs: Respirations even and unlabored.  CTA bilaterally, No W/R/R. Abdomen: BS x 4, soft, NT/ND.  Musculoskeletal: No gross deformities, no edema.  Skin: Intact, warm, no rashes.   Recent Labs  Lab 07/21/17 2302 07/22/17 0610  NA 140 143  K 3.3* 3.4*  CL 112* 120*  CO2 19* 15*  BUN 11 5*  CREATININE 0.73 0.63  GLUCOSE 104* 122*   Recent Labs  Lab 07/21/17 2302  HGB 11.8*  HCT 38.2  WBC 6.8  PLT 166   No results found.  STUDIES:  None.  SIGNIFICANT EVENTS  6/17 > admit.  ASSESSMENT / PLAN:  Intentional medication overdose as part of suicide attempt - Per EDP notes, pt admitted to ingesting  60 tabs of 500mg  Tylenol, ~40 - 50 tabs 2mg  Risperdal, 30 tabs 100mg  Zoloft, 30 tabs 100mg  Topamax, 40 tabs 500mg  Keppra.  Poison control was contacted by admitting team and gave recommendations as below. Plan: Continue N-Acetylcysteine per protocol. F/u APAP and LFT's in AM. QTc and QRS monitoring q4hrs until normalize - if QRS > 151ms then give HCO3 boluses at dose of 2mg /kg. Have changed bed placement to ICU for now for closer monitoring of pt's mental status as well as QTc / QRS - if pt remains stable then can likely transfer out to SDU by AM  6/18. Suicide precautions. Psychiatry consulted by primary team.  Hyperchloremic metabolic acidosis - due to overdose + saline. Hypokalemia - had ordered PO replacement; however, pt had vomiting episode. Hypomagnesemia - s/p repletion. Plan: Change fluids to HCO3. 4 runs K via IV. Follow BMP.  Rest per primary team.  PCCM will follow in ICU today, if remains stable can transfer out to SDU and PCCM will sign off at that time.   Montey Hora, [place] Pulmonary & Critical Care Medicine Pager: [phone]  or [phone] 07/22/2017, 8:49 AM

## 2017-07-22 NOTE — ED Notes (Addendum)
ICU at bedside

## 2017-07-22 NOTE — ED Notes (Signed)
Pt vomited potassium pills up.

## 2017-07-22 NOTE — Progress Notes (Signed)
Pharmacy Note:  Relayed most recent labs and VS to [employer].  Patient with no n/v.  Okay to dc acetadote at this point.  Poison Control will continue to follow. Thank you, Excell Seltzer, PharmD

## 2017-07-22 NOTE — ED Notes (Signed)
Spoke with poison control again.  Recommend patient continue maintenance acetadote for 24 hours.  Repeat tylenol level and liver function at 22 hours.

## 2017-07-22 NOTE — ED Notes (Signed)
CBG 107 

## 2017-07-22 NOTE — ED Notes (Signed)
Attempted to call report

## 2017-07-22 NOTE — H&P (Signed)
STAFF NOTE: I, Dr Ann Lions have personally reviewed patient's available data, including medical history, events of note, physical examination and test results as part of my evaluation. I have discussed with resident/NP and other care providers such as pharmacist, RN and RRT.  In addition,  I personally evaluated patient and elicited key findings of   S: Tylenol OD intentional per patient. Currently in [place]. On IV NAC.   O: siter at bedside BP/HR stable Fully oriented Not in distress Admits intentional intake Abd soft No stigmata of bleeding   PULMONARY Recent Labs  Lab 07/21/17 2322 07/22/17 0612  PHART  --  7.269*  PCO2ART  --  30.5*  PO2ART  --  100.0  HCO3 18.7* 14.0*  TCO2 20* 15*  O2SAT 100.0 97.0    CBC Recent Labs  Lab 07/21/17 2302  HGB 11.8*  HCT 38.2  WBC 6.8  PLT 166    COAGULATION No results for input(s): INR in the last 168 hours.  CARDIAC   Recent Labs  Lab 07/22/17 0732  TROPONINI <0.03   No results for input(s): PROBNP in the last 168 hours.   CHEMISTRY Recent Labs  Lab 07/21/17 2302 07/22/17 0134 07/22/17 0610  NA 140  --  143  K 3.3*  --  3.4*  CL 112*  --  120*  CO2 19*  --  15*  GLUCOSE 104*  --  122*  BUN 11  --  5*  CREATININE 0.73  --  0.63  CALCIUM 8.6*  --  7.7*  MG  --  1.6*  --    Estimated Creatinine Clearance: 107.3 mL/min (by C-G formula based on SCr of 0.63 mg/dL).   LIVER Recent Labs  Lab 07/21/17 2302  AST 16  ALT 16  ALKPHOS 60  BILITOT 0.4  PROT 6.3*  ALBUMIN 3.9     INFECTIOUS Recent Labs  Lab 07/22/17 0732  LATICACIDVEN 1.4     ENDOCRINE CBG (last 3)  Recent Labs    07/22/17 0744  GLUCAP 107*     Results for [name], [name] (MRN [id number]) as of 07/22/2017 09:16  Ref. Range 07/21/2017 22:38 07/22/2017 06:10  Alcohol, Ethyl (B) Latest Ref Range: <29 mg/dL <79   Salicylate Lvl Latest Ref Range: 2.8 - 30.0 mg/dL <7.0 <7.0   Results for [name], [name] (MRN [id number]) as of 07/22/2017  09:16  Ref. Range 07/21/2017 22:38  Amphetamines Latest Ref Range: NONE DETECTED  NONE DETECTED  Barbiturates Latest Ref Range: NONE DETECTED  Result not availa... (A)  Benzodiazepines Latest Ref Range: NONE DETECTED  NONE DETECTED  Opiates Latest Ref Range: NONE DETECTED  NONE DETECTED  COCAINE Latest Ref Range: NONE DETECTED  NONE DETECTED  Tetrahydrocannabinol Latest Ref Range: NONE DETECTED  NONE DETECTED  Results for [name], [name] (MRN [id number]) as of 07/22/2017 09:16  Ref. Range 07/27/2015 23:03 08/26/2015 01:30 08/29/2015 22:47 07/21/2017 22:38 07/22/2017 01:21  Acetaminophen (Tylenol), S Latest Ref Range: 10 - 30 ug/mL <10 (L) 15 <10 (L) 175 (HH) 239 (HH)     IMAGING x48h  - image(s) personally visualized  -   highlighted in bold No results found.      A: Tylenol OD - suicide intent. High levels Metabolic acidoisis Maintaining BP/HR and mental status No stigman of bleeding at this ppoint  P:  Agreew ith NAC Iv Start LR bolus and D5 LR maintenance to wash out acidosis Monitor INR Q4h Recheck LFT 1pm and again 7pm and again 07/23/17  Check electrolyes -  esp mag and phos (already repleted) FLuid bolus and maintenance - change NS to D5 LR and LR bolus   TRiad primary with ccm consult - dw/ Dr Sarajane Jews   .  Rest per NP/medical resident whose note is outlined above and that I agree with  The patient is critically ill with multiple organ systems failure and requires high complexity decision making for assessment and support, frequent evaluation and titration of therapies, application of advanced monitoring technologies and extensive interpretation of multiple databases.   Critical Care Time devoted to patient care services described in this note is  30  Minutes. This time reflects time of care of this signee Dr Brand Males. This critical care time does not reflect procedure time, or teaching time or supervisory time of PA/NP/Med student/Med Resident etc but could involve  care discussion time    Dr. Brand Males, M.D., [facility].C.P Pulmonary and Critical Care Medicine Staff Physician [place] [facility] Pager: [phone], If no answer or between  15:00h - 7:00h: call [phone]  07/22/2017 9:03 AM

## 2017-07-22 NOTE — Progress Notes (Signed)
  PROGRESS NOTE  [name] IRW:[id number] DOB: [birth year] DOA: 07/21/2017 PCP: Kathyrn Lass, MD  Brief Narrative: 27 year old woman presented with intentional overdose with Tylenol, risperidone, sertraline, Topamax, Keppra.  Admitted for treatment for Tylenol overdose.  Assessment/Plan Intentional overdose of Tylenol, risperidone, sertraline, Topamax, Keppra. --Replace potassium, magnesium to upper level of normals --Continue N-acetylcysteine, repeat Tylenol level, LFTs, INR at 22 hours --EKG every 4 hours until QTC normalizes, follow magnesium and potassium levels closely if QRS greater than 120 ms may need bicarbonate boluses given at 2 mg/kg body weight until QRS narrows.  Hyperchloremic metabolic acidosis --On lactated Ringer's and bicarbonate infusion  Prolonged QT --telemetry SR. Q4 EKG  Bipolar, schizoaffective disorder, depression, personality disorder --meds on hold  PMH prolactinoma, seizure d/o.  Case discussed with critical care Dr. Chase Caller and with poison control, time in coordination of care, interview of patient, chart review and order updates 40 minutes approximately 1130-1210  DVT prophylaxis: SCDs Code Status: Full Family Communication: none Disposition Plan: pending    [name] Hodgkins, MD  [facility] Direct contact: [phone] --[place] [place]  --[email or web address]; password [email or web address]  7PM-7AM contact night coverage as above 07/22/2017, 12:08 PM  LOS: 0 days   Consultants:  PCCM  Poison control  Procedures:    Antimicrobials:    Interval history/Subjective: Feels dizziness, but no other complaints. No abd pain. Sitter at bedside.  Objective: Vitals:  Vitals:   07/22/17 0900 07/22/17 1000  BP: 106/73 110/69  Pulse: 79 70  Resp: 19 17  Temp:    SpO2: 100% 99%    Exam:  Constitutional:  . Appears calm and comfortable. Alert, speech fluent and clear. Eyes:  . pupils and irises appear normal . Normal lids  ENMT:   . grossly normal hearing  Respiratory:  . CTA bilaterally, no w/r/r.  . Respiratory effort normal. Cardiovascular:  . RRR, no m/r/g . No LE extremity edema   Abdomen:  . Soft, ntnd, no RUQ  pain Psychiatric:  . Mental status o Flat affect  I have personally reviewed the following:   Labs:  Lactic acid WNL  INR 1.41  LFTs unremarkable  Troponin negative  ABG 7.269/30/100  Creatinine WNL  Mg 1.6  Tylenol 175 > 239  Medical tests:  EKG SR prolonged QT   Scheduled Meds: Continuous Infusions: . acetylcysteine 15 mg/kg/hr (07/22/17 0054)  . dextrose 5% lactated ringers 100 mL/hr at 07/22/17 1156  . potassium chloride 10 mEq (07/22/17 1103)  .  sodium bicarbonate (isotonic) infusion in sterile water 100 mL/hr at 07/22/17 1032    Principal Problem:   Drug overdose Active Problems:   Schizoaffective disorder, bipolar type by history(HCC)   Seizure disorder (HCC)   Overdose   Metabolic acidosis   LOS: 0 days

## 2017-07-22 NOTE — ED Notes (Signed)
Pt had peed the bed and placed on purewick.

## 2017-07-22 NOTE — Progress Notes (Signed)
Pharmacy note: allergy assessment   There is an allergy noted to quetiapine that alerts when D5 with LR is ordered. Pharmacy was requested to look into this  One of the components of quetiapine is cornstarch and any medication that is made with a component in the same class will cause an alert from EPIC.  In this case, it is the dextrose component of D5 with LR that causes the alert because dextrose is considered a relation to cornstarch.  The allergy to quetiapine does not mean there will be an allergy to any dextrose containing product just that both products contain a related substance.   Summary -Any Dextrose containing product may cause an alert for a quetiapine allergy -There should be no concern for a true allergy between quetiapine and dextrose containing products unless the patient has a corn allergy (the allergy to quetiapine appears to be intolerance)    Hildred Laser, PharmD Clinical Pharmacist Clinical phone from 8:30-4:00 is [phone] After 4pm, please call Main Rx [phone]) for assistance. 07/22/2017 10:10 AM

## 2017-07-22 NOTE — Progress Notes (Addendum)
Visited with this patient this morning via [place].  Sherah Gaffer support and welcomes having conversations about things that have happened and things that she is currently feeling.  She states that she has tried numerous times to kill herself.  Many times she wanted to get away from her mother so she took lots of pills and another time she talks about when she found out she was pregnant, she tried again.  She feels like there must be some purpose connected to why she is still living.  She says this time her daughter was turning a year old and Father's day came and it was a year ago when her husband died by suicide.  She states that his reason for ending his life was about not being able to have custody rights for his daughter because of mental health issues.  She states that she wants rights also but knows it will be a problem now with another attempt at suicide.  She says this time she was trying to get to her mother-in-law because she knew she needed help but found it too difficult.  She welcomes conversation and is pleasant to talk to.  She showed me pictures of her daughter on her phone and these pictures are what helps her get through tough moments.  Right now she states she says she is mostly over what caused her to be overwhelmed.  She doesn't know how to get help at that very moment when things are overbearing which is why she was trying to get to her mother-in-law who lives below or above her. Clarise Cruz and I agreed that I would visit again. Thank you medical professionals for caring for her. Please page as needed.    07/22/17 0953  Clinical Encounter Type  Visited With Patient;Health care provider  Visit Type Initial;Psychological support;Spiritual support

## 2017-07-22 NOTE — H&P (Addendum)
History and Physical    [name]:[id number] DOB: 1991-01-31 DOA: 07/21/2017  PCP: Kathyrn Lass, MD  Patient coming from: Home.  Chief Complaint: Multiple drug overdose.  HPI: [name] is a 27 y.o. female with history of schizoaffective disorder, prolactinoma, recently admitted for pancreatitis of unknown cause was brought to the ER after patient told her family to call EMS since patient was feeling uneasy.  Patient stated that she took overdose of her medications due to depression and suicidal ideation.  She took it around 9:30 PM last night on July 21, 2017.  She came 1 hour after the ingestion.   From the ER physician's notes 'Patient alleges taking 60 tablets of 500mg  Tylenol, ~40-50 tablets of 2mg  Risperdal, 30 tablets of 100mg  Zoloft, 30 tablets of 100mg  Topamax, 40 tablets of 500mg  Keppra'.  ED Course: Patient in the ER was mildly hypotensive and labs revealed mild acidosis mild hypokalemia and hypomagnesemia with elevated Tylenol level.  Initially was 175 4 hours later was 239.  EKG shows normal sinus rhythm with QTC around 497 ms.  Urine drug screen was positive for barbiturates.  Patient on exam is mildly drowsy and as per the patient's nurse patient has been so since admission has not worsened.  Pupils are reacting to light.  Poison control at this time advised N -acetylcysteine for Tylenol overdose.  Review of Systems: As per HPI, rest all negative.   Past Medical History:  Diagnosis Date  . Acid reflux   . Asthma    last attack 03/13/15 or 03/14/15  . Autism   . 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 Chronic constipation   . Depression   . Drug-seeking behavior   . Essential tremor   . Headache   . Personality disorder ([place])   . Schizo-affective psychosis ([place])   . Schizoaffective disorder, bipolar type ([place])   . Seizures [facility])    Last seizure December 2017    Past Surgical History:  Procedure Laterality Date  . MOUTH SURGERY  2009 or 2010     reports that  she has quit smoking. She smoked 0.00 packs per day. She has never used smokeless tobacco. She reports that she does not drink alcohol or use drugs.  Allergies  Allergen Reactions  . Bee Venom Anaphylaxis  . Coconut Flavor Anaphylaxis  . Coconut Oil Anaphylaxis and Rash  . Geodon [Ziprasidone Hcl] Other (See Comments)    Pt states that this medication causes paralysis of the mouth.    . Haloperidol And Related Other (See Comments)    Pt states that this medication causes paralysis of the mouth.    . Lithium Other (See Comments)    Reaction:  Seizure-like activity.    . Oxycodone Other (See Comments)    Reaction:  Hallucinations   . Quetiapine Fumarate Other (See Comments)    TOO STRONG  . Seroquel [Quetiapine Fumarate] Other (See Comments)    Pt states that this medication is too strong.   . Shellfish Allergy Anaphylaxis  . Percocet [Oxycodone-Acetaminophen] Other (See Comments)    Reaction:  Hallucinations   . Phenergan [Promethazine Hcl] Other (See Comments)    Reaction:  Chest pain    . Prilosec [Omeprazole] Nausea And Vomiting  . Sulfa Antibiotics Other (See Comments)    Chest pain   . Tegretol [Carbamazepine] Nausea And Vomiting    Family History  Problem Relation Age of Onset  . Mental illness Father   . Asthma Father   .  PDD Brother   . Seizures Brother     Prior to Admission medications   Medication Sig Start Date End Date Taking? Authorizing Provider  acetaminophen (TYLENOL) 500 MG tablet Take 1,000 mg by mouth every 6 (six) hours as needed for mild pain, moderate pain or headache.    [provider]  albuterol (PROVENTIL HFA;VENTOLIN HFA) 108 (90 Base) MCG/ACT inhaler Inhale 1-2 puffs into the lungs every 6 (six) hours as needed for wheezing or shortness of breath.    [provider]  b complex vitamins tablet Take 1 tablet by mouth daily.    [provider]  cabergoline (DOSTINEX) 0.5 MG tablet Take 0.5 tablets (0.25 mg total) by  mouth 2 (two) times a week. Patient not taking: Reported on 06/30/2017 08/20/16   Chancy Milroy, MD  Coenzyme Q10 (COQ-10) 100 MG CAPS Take 1 tablet by mouth daily.    [provider]  fluticasone-salmeterol (ADVAIR HFA) 115-21 MCG/ACT inhaler Inhale 2 puffs into the lungs 2 (two) times daily. 08/03/15   Timmothy Euler, MD  HYDROcodone-acetaminophen (NORCO/VICODIN) 5-325 MG tablet Take 1 tablet by mouth every 6 (six) hours as needed for severe pain. 07/14/17   Carmin Muskrat, MD  levETIRAcetam (KEPPRA) 500 MG tablet TAKE 1 TABLET BY MOUTH TWICE A DAY 05/06/17   Pieter Partridge, DO  levonorgestrel-ethinyl estradiol (JOLESSA) 0.15-0.03 MG tablet Take 1 tablet by mouth daily. Patient not taking: Reported on 06/30/2017 08/09/16   Chancy Milroy, MD  magnesium gluconate (MAGONATE) 500 MG tablet Take 500 mg by mouth daily.    [provider]  Multiple Vitamin (MULTIVITAMIN) tablet Take 1 tablet by mouth daily.    [provider]  ondansetron (ZOFRAN ODT) 4 MG disintegrating tablet Take 1 tablet (4 mg total) by mouth every 8 (eight) hours as needed for nausea or vomiting. 07/14/17   Carmin Muskrat, MD  risperiDONE (RISPERDAL) 2 MG tablet Take 2 mg by mouth 2 (two) times daily.     [provider]  sertraline (ZOLOFT) 100 MG tablet Take 100 mg by mouth daily.    [provider]  topiramate (TOPAMAX) 100 MG tablet Take 1 tablet (100 mg total) by mouth at bedtime. 09/04/16   Pieter Partridge, DO    Physical Exam: Vitals:   07/22/17 0430 07/22/17 0445 07/22/17 0500 07/22/17 0515  BP: 108/67 119/72 133/62 101/69  Pulse: 96 91 68 98  Resp: 17 16 (!) 23 20  Temp:      TempSrc:      SpO2: 99% 99% 100% 99%  Weight:      Height:          Constitutional: Moderately built and nourished. Vitals:   07/22/17 0430 07/22/17 0445 07/22/17 0500 07/22/17 0515  BP: 108/67 119/72 133/62 101/69  Pulse: 96 91 68 98  Resp: 17 16 (!) 23 20  Temp:      TempSrc:      SpO2:  99% 99% 100% 99%  Weight:      Height:       Eyes: Anicteric no pallor. ENMT: No discharge from the ears eyes nose or mouth. Neck: No mass felt.  No JVD appreciated. Respiratory: No rhonchi or crepitations. Cardiovascular: S1-S2 heard no murmurs appreciated. Abdomen: Soft nontender bowel sounds present. Musculoskeletal: No edema.  No joint effusion. Skin: No rash. Neurologic: Mildly drowsy but arousable and follows commands and is oriented to name place.  Moves all extremities. Psychiatric: Suicidal.   Labs on Admission: I  have personally reviewed following labs and imaging studies  CBC: Recent Labs  Lab 07/21/17 2302  WBC 6.8  NEUTROABS 3.6  HGB 11.8*  HCT 38.2  MCV 89.3  PLT 017   Basic Metabolic Panel: Recent Labs  Lab 07/21/17 2302 07/22/17 0134  NA 140  --   K 3.3*  --   CL 112*  --   CO2 19*  --   GLUCOSE 104*  --   BUN 11  --   CREATININE 0.73  --   CALCIUM 8.6*  --   MG  --  1.6*   GFR: Estimated Creatinine Clearance: 107.3 mL/min (by C-G formula based on SCr of 0.73 mg/dL). Liver Function Tests: Recent Labs  Lab 07/21/17 2302  AST 16  ALT 16  ALKPHOS 60  BILITOT 0.4  PROT 6.3*  ALBUMIN 3.9   No results for input(s): LIPASE, AMYLASE in the last 168 hours. No results for input(s): AMMONIA in the last 168 hours. Coagulation Profile: No results for input(s): INR, PROTIME in the last 168 hours. Cardiac Enzymes: No results for input(s): CKTOTAL, CKMB, CKMBINDEX, TROPONINI in the last 168 hours. BNP (last 3 results) No results for input(s): PROBNP in the last 8760 hours. HbA1C: No results for input(s): HGBA1C in the last 72 hours. CBG: No results for input(s): GLUCAP in the last 168 hours. Lipid Profile: No results for input(s): CHOL, HDL, LDLCALC, TRIG, CHOLHDL, LDLDIRECT in the last 72 hours. Thyroid Function Tests: No results for input(s): TSH, T4TOTAL, FREET4, T3FREE, THYROIDAB in the last 72 hours. Anemia Panel: No results for input(s):  VITAMINB12, FOLATE, FERRITIN, TIBC, IRON, RETICCTPCT in the last 72 hours. Urine analysis:    Component Value Date/Time   COLORURINE YELLOW 07/14/2017 1922   APPEARANCEUR HAZY (A) 07/14/2017 1922   LABSPEC 1.009 07/14/2017 1922   PHURINE 6.0 07/14/2017 1922   GLUCOSEU NEGATIVE 07/14/2017 [place] NEGATIVE 07/14/2017 [place] NEGATIVE 07/14/2017 1922   BILIRUBINUR negative 06/14/2015 0930   KETONESUR NEGATIVE 07/14/2017 1922   PROTEINUR NEGATIVE 07/14/2017 1922   UROBILINOGEN 0.2 10/24/2015 1428   NITRITE NEGATIVE 07/14/2017 1922   LEUKOCYTESUR NEGATIVE 07/14/2017 1922   Sepsis Labs: @LABRCNTIP (procalcitonin:4,lacticidven:4) )No results found for this or any previous visit (from the past 240 hour(s)).   Radiological Exams on Admission: No results found.  EKG: Independently reviewed.  Normal sinus rhythm with QRS of 93 ms and QTC of 497 ms.  Assessment/Plan Principal Problem:   Drug overdose Active Problems:   Schizoaffective disorder, bipolar type by history(HCC)   Seizure disorder ([place])    1. Multiple drug overdose including Tylenol Keppra Risperdal Zoloft and Topamax with suicidal ideation -discussed with poison control.  Poison control advised N-acetylcysteine and at 22 hours to recheck LFTs and Tylenol level.  Check EKG every 4 hours until QTC and QRS becomes normal.  If QTC is prolonged then will need to check stat mag and potassium levels.  If QRS is more than 120 ms may need bicarbonate boluses given at 2 mg/kg body weight until QRSs narrow.  For now patient will be kept n.p.o. until patient is more alert awake.  Follow metabolic panel for also acidosis.  I also ordered ABG.  May need PRN Ativan if patient gets agitated. 2. Suicide ideation  -please consult psychiatry in a.m. 3. History of prolactinoma on cabergoline.  Presently n.p.o. since patient is drowsy. 4. History of seizure disorder -patient overdosed on Keppra. 5. Recently admitted for  pancreatitis.  Addendum - Patient  getting more acidotic. Consulted PCCM.   DVT prophylaxis: SCDs. Code Status: Full code. Family Communication: No family at the bedside. Disposition Plan: To be determined. Consults called: Pulmonary critical care. Admission status: Inpatient.   Rise Patience MD [facility] Pager [phone].  If 7PM-7AM, please contact night-coverage [email or web address] Password [facility]  07/22/2017, 5:23 AM

## 2017-07-23 DIAGNOSIS — Z818 Family history of other mental and behavioral disorders: Secondary | ICD-10-CM

## 2017-07-23 DIAGNOSIS — Z87891 Personal history of nicotine dependence: Secondary | ICD-10-CM

## 2017-07-23 DIAGNOSIS — T1491XA Suicide attempt, initial encounter: Secondary | ICD-10-CM

## 2017-07-23 LAB — GLUCOSE, CAPILLARY
Glucose-Capillary: 90 mg/dL (ref 65–99)
Glucose-Capillary: 98 mg/dL (ref 65–99)

## 2017-07-23 LAB — CBC WITH DIFFERENTIAL/PLATELET
Abs Immature Granulocytes: 0 10*3/uL (ref 0.0–0.1)
BASOS ABS: 0 10*3/uL (ref 0.0–0.1)
Basophils Relative: 0 %
EOS ABS: 0 10*3/uL (ref 0.0–0.7)
EOS PCT: 1 %
HEMATOCRIT: 32.3 % — AB (ref 36.0–46.0)
HEMOGLOBIN: 10.4 g/dL — AB (ref 12.0–15.0)
Immature Granulocytes: 0 %
Lymphocytes Relative: 42 %
Lymphs Abs: 2.4 10*3/uL (ref 0.7–4.0)
MCH: 27.7 pg (ref 26.0–34.0)
MCHC: 32.2 g/dL (ref 30.0–36.0)
MCV: 86.1 fL (ref 78.0–100.0)
MONO ABS: 0.3 10*3/uL (ref 0.1–1.0)
Monocytes Relative: 6 %
Neutro Abs: 2.9 10*3/uL (ref 1.7–7.7)
Neutrophils Relative %: 51 %
Platelets: 157 10*3/uL (ref 150–400)
RBC: 3.75 MIL/uL — ABNORMAL LOW (ref 3.87–5.11)
RDW: 13.2 % (ref 11.5–15.5)
WBC: 5.8 10*3/uL (ref 4.0–10.5)

## 2017-07-23 LAB — HEPATIC FUNCTION PANEL
ALK PHOS: 42 U/L (ref 38–126)
ALT: 12 U/L — AB (ref 14–54)
AST: 12 U/L — ABNORMAL LOW (ref 15–41)
Albumin: 2.8 g/dL — ABNORMAL LOW (ref 3.5–5.0)
BILIRUBIN TOTAL: 0.2 mg/dL — AB (ref 0.3–1.2)
Total Protein: 4.8 g/dL — ABNORMAL LOW (ref 6.5–8.1)

## 2017-07-23 LAB — PROTIME-INR
INR: 1.38
INR: 1.27
PROTHROMBIN TIME: 16.9 s — AB (ref 11.4–15.2)
Prothrombin Time: 15.8 seconds — ABNORMAL HIGH (ref 11.4–15.2)

## 2017-07-23 LAB — COMPREHENSIVE METABOLIC PANEL
ALBUMIN: 3.3 g/dL — AB (ref 3.5–5.0)
ALT: 12 U/L — AB (ref 14–54)
AST: 14 U/L — AB (ref 15–41)
Alkaline Phosphatase: 54 U/L (ref 38–126)
Anion gap: 5 (ref 5–15)
CHLORIDE: 119 mmol/L — AB (ref 101–111)
CO2: 21 mmol/L — ABNORMAL LOW (ref 22–32)
CREATININE: 0.67 mg/dL (ref 0.44–1.00)
Calcium: 8.4 mg/dL — ABNORMAL LOW (ref 8.9–10.3)
GFR calc Af Amer: >60 mL/min (ref >60)
GFR calc non Af Amer: >60 mL/min (ref >60)
Glucose, Bld: 116 mg/dL — ABNORMAL HIGH (ref 65–99)
POTASSIUM: 3.6 mmol/L (ref 3.5–5.1)
Sodium: 145 mmol/L (ref 135–145)
Total Bilirubin: 0.3 mg/dL (ref 0.3–1.2)
Total Protein: 5.5 g/dL — ABNORMAL LOW (ref 6.5–8.1)

## 2017-07-23 LAB — CBC
HEMATOCRIT: 36.7 % (ref 36.0–46.0)
Hemoglobin: 11.6 g/dL — ABNORMAL LOW (ref 12.0–15.0)
MCH: 27.6 pg (ref 26.0–34.0)
MCHC: 31.6 g/dL (ref 30.0–36.0)
MCV: 87.4 fL (ref 78.0–100.0)
PLATELETS: 154 10*3/uL (ref 150–400)
RBC: 4.2 MIL/uL (ref 3.87–5.11)
RDW: 13.2 % (ref 11.5–15.5)
WBC: 5.4 10*3/uL (ref 4.0–10.5)

## 2017-07-23 LAB — POCT I-STAT 3, ART BLOOD GAS (G3+)
ACID-BASE DEFICIT: 8 mmol/L — AB (ref 0.0–2.0)
BICARBONATE: 16.2 mmol/L — AB (ref 20.0–28.0)
O2 Saturation: 98 %
TCO2: 17 mmol/L — AB (ref 22–32)
pCO2 arterial: 29.7 mmHg — ABNORMAL LOW (ref 32.0–48.0)
pH, Arterial: 7.345 — ABNORMAL LOW (ref 7.350–7.450)
pO2, Arterial: 111 mmHg — ABNORMAL HIGH (ref 83.0–108.0)

## 2017-07-23 LAB — LACTIC ACID, PLASMA: Lactic Acid, Venous: 1.4 mmol/L (ref 0.5–1.9)

## 2017-07-23 LAB — MAGNESIUM: Magnesium: 1.9 mg/dL (ref 1.7–2.4)

## 2017-07-23 LAB — PHOSPHORUS: Phosphorus: 2.6 mg/dL (ref 2.5–4.6)

## 2017-07-23 MED ORDER — DEXTROSE-NACL 5-0.45 % IV SOLN: INTRAVENOUS | Status: DC

## 2017-07-23 NOTE — Progress Notes (Signed)
IVC approved by magistrate and placed on chart- request made to [employer] to come serve patient- once served IVC good from 6/18-6/25  Jorge Ny, [place] Social Worker [phone]

## 2017-07-23 NOTE — Progress Notes (Addendum)
3:30pm CSW informed that psych has recommended for inpatient psych- referral made to [facility] they don't have a bed today but will put on list  Patient attempting to leave IVC requested- CSW initiating  CSW left message for legal guardian listed in chart- awaiting response   12:30pm CSW received consult for intentional overdose- will await psychiatric assessment for recommendations and assist as needed  Please call CSW if patient tries to leave and IVC needs to be initiated.  Jorge Ny, LCSW Clinical Social Worker [phone]

## 2017-07-23 NOTE — Consult Note (Signed)
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 K/uL   Immature Granulocytes 0 %   Abs Immature Granulocytes 0.0 0.0 - 0.1 K/uL    Comment: Performed at [facility], [address] [place], [place] [address]  Protime-INR     Status: Abnormal   Collection Time: 07/23/17 12:44 AM  Result Value Ref Range   Prothrombin Time 16.9 (H) 11.4 - 15.2 seconds   INR 1.38     Comment: Performed at [facility] [address] [place], [place] [address]  Glucose, capillary     Status:  None   Collection Time: 07/23/17  3:54 AM  Result Value Ref Range   Glucose-Capillary 90 65 - 99 mg/dL   Comment 1 Notify RN   Glucose, capillary     Status: None   Collection Time: 07/23/17  7:38 AM  Result Value Ref Range   Glucose-Capillary 98 65 - 99 mg/dL   Comment 1 Capillary Specimen    Comment 2 Notify RN   Protime-INR     Status: Abnormal   Collection Time: 07/23/17  8:48 AM  Result Value Ref Range   Prothrombin Time 15.8 (H) 11.4 - 15.2 seconds   INR 1.27     Comment: Performed at [facility], [place] [address] [place], [place] [address]  Comprehensive metabolic panel     Status: Abnormal   Collection Time: 07/23/17  8:48 AM  Result Value Ref Range   Sodium 145 135 - 145 mmol/L   Potassium 3.6 3.5 - 5.1 mmol/L   Chloride 119 (H) 101 - 111 mmol/L   CO2 21 (L) 22 - 32 mmol/L   Glucose, Bld 116 (H) 65 - 99 mg/dL   BUN <5 (L) 6 - 20 mg/dL   Creatinine, Ser 0.67 0.44 - 1.00 mg/dL   Calcium 8.4 (L) 8.9 - 10.3 mg/dL   Total Protein 5.5 (L) 6.5 - 8.1 g/dL   Albumin 3.3 (L) 3.5 - 5.0 g/dL   AST  14 (L) 15 - 41 U/L   ALT 12 (L) 14 - 54 U/L   Alkaline Phosphatase 54 38 - 126 U/L   Total Bilirubin 0.3 0.3 - 1.2 mg/dL   GFR calc non Af Amer >60 >60 mL/min   GFR calc Af Amer >60 >60 mL/min    Comment: (NOTE) The eGFR has been calculated using the CKD EPI equation. This calculation has not been validated in all clinical situations. eGFR's persistently <60 mL/min signify possible Chronic Kidney Disease.    Anion gap 5 5 - 15    Comment: Performed at [facility] [address], [place], [place] [address]  CBC     Status: Abnormal   Collection Time: 07/23/17  8:48 AM  Result Value Ref Range   WBC 5.4 4.0 - 10.5 K/uL   RBC 4.20 3.87 - 5.11 MIL/uL   Hemoglobin 11.6 (L) 12.0 - 15.0 g/dL   HCT 36.7 36.0 - 46.0 %   MCV 87.4 78.0 - 100.0 fL   MCH 27.6 26.0 - 34.0 pg   MCHC 31.6 30.0 - 36.0 g/dL   RDW 13.2 11.5 - 15.5 %   Platelets 154 150 - 400 K/uL    Comment:  Performed at [facility], [place] [address], [place], [place] [address]  I-STAT 3, arterial blood gas (G3+)     Status: Abnormal   Collection Time: 07/23/17  9:57 AM  Result Value Ref Range   pH, Arterial 7.345 (L) 7.350 - 7.450   pCO2 arterial 29.7 (L) 32.0 - 48.0 mmHg   pO2, Arterial 111.0 (H) 83.0 - 108.0 mmHg   Bicarbonate 16.2 (L) 20.0 - 28.0 mmol/L   TCO2 17 (L) 22 - 32 mmol/L   O2 Saturation 98.0 %   Acid-base deficit 8.0 (H) 0.0 - 2.0 mmol/L   Patient temperature HIDE    Collection site RADIAL, ALLEN'S TEST ACCEPTABLE    Drawn by Operator    Sample type ARTERIAL     Current Facility-Administered Medications  Medication Dose Route Frequency Provider Last Rate Last Dose  . sodium bicarbonate 150 mEq in sterile water 1,000 mL infusion   Intravenous Continuous Samuella Cota, MD 100 mL/hr at 07/23/17 1100      Musculoskeletal: Strength & Muscle Tone: within normal limits Gait & Station: UTA since patient was lying in bed. Patient leans: N/A  Psychiatric Specialty Exam: Physical Exam  Nursing note and vitals reviewed. Constitutional: She is oriented to person, place, and time. She appears well-developed and well-nourished.  HENT:  Head: Normocephalic and atraumatic.  Neck: Normal range of motion.  Respiratory: Effort normal.  Musculoskeletal: Normal range of motion.  Neurological: She is alert and oriented to person, place, and time.  Skin: No rash noted.  Psychiatric: Her speech is normal and behavior is normal. Judgment and thought content normal. Cognition and memory are normal. She exhibits a depressed mood.    Review of Systems  Constitutional: Negative for chills and fever.  Cardiovascular: Negative for chest pain.  Gastrointestinal: Negative for abdominal pain, constipation, diarrhea, nausea and vomiting.  Psychiatric/Behavioral: Negative for depression, hallucinations, substance abuse and suicidal ideas. The patient does not have insomnia.   All other  systems reviewed and are negative.   Blood pressure 116/71, pulse 82, temperature 98 F (36.7 C), temperature source Oral, resp. rate 20, height 5' 4"  (1.626 m), weight 86 kg (189 lb 9.5 oz), SpO2 99 %, not currently breastfeeding.Body mass index is 32.54 kg/m.  General Appearance: Fairly  Groomed, young, Caucasian female, wearing a hospital gown and lying in bed. NAD.   Eye Contact:  Good  Speech:  Clear and Coherent and Normal Rate  Volume:  Normal  Mood:  "I'm very good. I'm happy."  Affect:  Constricted  Thought Process:  Goal Directed, Linear and Descriptions of Associations: Intact  Orientation:  Full (Time, Place, and Person)  Thought Content:  Logical  Suicidal Thoughts:  No  Homicidal Thoughts:  No  Memory:  Immediate;   Good Recent;   Good Remote;   Good  Judgement:  Poor  Insight:  Poor  Psychomotor Activity:  Normal  Concentration:  Concentration: Good and Attention Span: Good  Recall:  Good  Fund of Knowledge:  Good  Language:  Good  Akathisia:  No  Handed:  Right  AIMS (if indicated):   N/A  Assets:  Communication Skills Housing Social Support  ADL's:  Intact  Cognition:  WNL  Sleep:   Okay   Assessment: [name] is a 27 y.o. female who was admitted with suicide attempt by multidrug overdose. Patient reports recent complications from pancreatitis and being unable to take her psychiatric medications for a week. She reports depression in the setting of Father's Day since her husband is deceased due to completing suicide a year ago. She minimizes her suicide attempt to the point of denial and shows poor insight about the seriousness of her attempt. She warrants inpatient psychiatric hospitalization for stabilization and treatment.   Treatment Plan Summary: -Patient warrants inpatient psychiatric hospitalization given high risk of harm to self. -Continue bedside sitter.  -Continue to hold home medications given overdose.  -EKG reviewed and QTc 497 (from 619  on 6/17). Please closely monitor when starting or increasing QTc prolonging agents.  -Please pursue involuntary commitment if patient refuses voluntary psychiatric hospitalization or attempts to leave the hospital.  -Will sign off on patient at this time. Please consult psychiatry again as needed.     Disposition: Recommend psychiatric Inpatient admission when medically cleared.  Faythe Dingwall, DO 07/23/2017 11:24 AM

## 2017-07-23 NOTE — Progress Notes (Signed)
RN contacted SW to initiate bed request for behavioral health. Bed will be requested, patient to transfer to 5W room 31 to await [facility] placement.

## 2017-07-23 NOTE — Plan of Care (Signed)
Patient physically is improving and is able to move about the room and up to the restroom with no assistance.She maintains a good appetite and is taking in adequate fluids. She says she understands what she did wrong and that she is no longer suicidal but this is questionable and she does not appear to be truly genuine in her responses. Currently she is willing to go to behavioral health if it is deemed necessary but when the time actually comes it is uncertain if she will be agreeable. Per the MD if she attempts to leave staff is to call security and have her placed on IVC.

## 2017-07-23 NOTE — Progress Notes (Signed)
Patient transferring to 5W room 331. Report called to [facility]. All belongings are accounted for.

## 2017-07-23 NOTE — Discharge Summary (Signed)
Physician Discharge Summary  [name] YQM:[id number] DOB: 14-May-1990 DOA: 07/21/2017  PCP: Kathyrn Lass, MD  Admit date: 07/21/2017 Discharge date: 07/23/2017  Transfer to inpatient psychiatric facility    Discharge Diagnoses:  1. Intentional overdose of Tylenol, risperidone, sertraline, Topamax, Keppra. 2. Hyperchloremic metabolic acidosis 3. Prolonged QT 4. Bipolar, schizoaffective disorder, depression, personality disorder  Discharge Condition: improved Disposition: inpatient psychiatric facility  Diet recommendation: Regular  Filed Weights   07/21/17 2233 07/23/17 0600  Weight: 80.3 kg (177 lb) 86 kg (189 lb 9.5 oz)    History of present illness:  27 year old woman presented with intentional overdose with Tylenol, risperidone, sertraline, Topamax, Keppra.  Admitted for treatment for Tylenol overdose.  Hospital Course:  Patient was placed in ICU and monitored closely, treated with Mucomyst in coordination with poison control.  LFTs remain normal, subsequent Tylenol level was within normal limits.  INR trended down.  No events on telemetry.  EKG normalized.  No apparent sequela noted.  Patient alert, awake and without complaint.  Psychiatry was consulted with recommendation for inpatient psychiatric treatment.  Intentional overdose of Tylenol, risperidone, sertraline, Topamax, Keppra. --Fortunately no apparent sequela.  Telemetry sinus rhythm, hemodynamics stable, follow-up laboratory studies reassuring.  Case discussed with poison control who recommends no further follow-up or testing at this point.    Hyperchloremic metabolic acidosis --Essentially resolved.    Prolonged QT --Resolved with supportive care.  Bipolar, schizoaffective disorder, depression, personality disorder --meds on hold.  Psychiatry consultation appreciated.  PMH prolactinoma, seizure d/o.  Consultants:  PCCM  Poison control  Today's assessment: S: feels fine today. No complaints. No  n/v. No pain. No issues per RN, no seizures, no events on telemetry. O: Vitals:  Vitals:   07/23/17 1212 07/23/17 1300  BP:  116/65  Pulse:  64  Resp:  19  Temp: 98.4 F (36.9 C)   SpO2:  99%    Constitutional:  . Appears calm and comfortable ENMT:  . grossly normal hearing  Respiratory:  . CTA bilaterally, no w/r/r.  . Respiratory effort normal Cardiovascular:  . RRR, no m/r/g . No LE extremity edema   Psychiatric:  . judgement and insight appear impaired . Mental status o Mood difficult to gauge, affect flat  BMP unremarkable.  CO2 21.  LFTs within normal limits.  INR trending down, 1.27.  CBC unremarkable.  EKG independently reviewed, sinus rhythm, no acute changes.  Normal QTC and QRS.  Telemetry sinus rhythm, no acute changes G7.3 45/29/111  Discharge Instructions  Discharge Instructions    Activity as tolerated - No restrictions   Complete by:  As directed    Diet general   Complete by:  As directed      Allergies as of 07/23/2017      Reactions   Bee Venom Anaphylaxis   Coconut Flavor Anaphylaxis, Rash   Geodon [ziprasidone Hcl] Other (See Comments)   Pt states that this medication causes paralysis of the mouth.     Haloperidol And Related Other (See Comments)   Pt states that this medication causes paralysis of the mouth.     Lithium Other (See Comments)   Reaction:  Seizure-like activity.    Oxycodone Other (See Comments)   Reaction:  Hallucinations    Seroquel [quetiapine Fumarate] Other (See Comments)   Pt states that this medication is too strong.    Shellfish Allergy Anaphylaxis   Phenergan [promethazine Hcl] Other (See Comments)   Reaction:  Chest pain    Prilosec [omeprazole] Nausea And Vomiting  Sulfa Antibiotics Other (See Comments)   Chest pain    Tegretol [carbamazepine] Nausea And Vomiting      Medication List    STOP taking these medications   acetaminophen 500 MG tablet Commonly known as:  TYLENOL   b complex vitamins  tablet   cabergoline 0.5 MG tablet Commonly known as:  DOSTINEX   CoQ-10 100 MG Caps   HYDROcodone-acetaminophen 5-325 MG tablet Commonly known as:  NORCO/VICODIN   ondansetron 4 MG disintegrating tablet Commonly known as:  ZOFRAN ODT   risperiDONE 2 MG tablet Commonly known as:  RISPERDAL   sertraline 100 MG tablet Commonly known as:  ZOLOFT   topiramate 100 MG tablet Commonly known as:  TOPAMAX     TAKE these medications   albuterol 108 (90 Base) MCG/ACT inhaler Commonly known as:  PROVENTIL HFA;VENTOLIN HFA Inhale 1-2 puffs into the lungs every 6 (six) hours as needed for wheezing or shortness of breath.   fluticasone-salmeterol 115-21 MCG/ACT inhaler Commonly known as:  ADVAIR HFA Inhale 2 puffs into the lungs 2 (two) times daily.   levETIRAcetam 500 MG tablet Commonly known as:  KEPPRA TAKE 1 TABLET BY MOUTH TWICE A DAY   levonorgestrel-ethinyl estradiol 0.15-0.03 MG tablet Commonly known as:  JOLESSA Take 1 tablet by mouth daily.   magnesium gluconate 500 MG tablet Commonly known as:  MAGONATE Take 500 mg by mouth daily.   multivitamin tablet Take 1 tablet by mouth daily.      Allergies  Allergen Reactions  . Bee Venom Anaphylaxis  . Coconut Flavor Anaphylaxis and Rash  . Geodon [Ziprasidone Hcl] Other (See Comments)    Pt states that this medication causes paralysis of the mouth.    . Haloperidol And Related Other (See Comments)    Pt states that this medication causes paralysis of the mouth.    . Lithium Other (See Comments)    Reaction:  Seizure-like activity.    . Oxycodone Other (See Comments)    Reaction:  Hallucinations   . Seroquel [Quetiapine Fumarate] Other (See Comments)    Pt states that this medication is too strong.   . Shellfish Allergy Anaphylaxis  . Phenergan [Promethazine Hcl] Other (See Comments)    Reaction:  Chest pain    . Prilosec [Omeprazole] Nausea And Vomiting  . Sulfa Antibiotics Other (See Comments)    Chest pain    . Tegretol [Carbamazepine] Nausea And Vomiting    The results of significant diagnostics from this hospitalization (including imaging, microbiology, ancillary and laboratory) are listed below for reference.    Significant Diagnostic Studies:  Microbiology: Recent Results (from the past 240 hour(s))  MRSA PCR Screening     Status: None   Collection Time: 07/22/17  8:23 AM  Result Value Ref Range Status   MRSA by PCR NEGATIVE NEGATIVE Final    Comment:        The GeneXpert MRSA Assay (FDA approved for NASAL specimens only), is one component of a comprehensive MRSA colonization surveillance program. It is not intended to diagnose MRSA infection nor to guide or monitor treatment for MRSA infections. Performed at [facility], [place] [address] [place], [place] [address]      Labs: Basic Metabolic Panel: Recent Labs  Lab 07/22/17 0134 07/22/17 0610 07/22/17 1230 07/22/17 1717 07/22/17 2157 07/23/17 0044 07/23/17 0848  NA  --  143 143 145 144  --  145  K  --  3.4* 3.7 3.7 3.5  --  3.6  CL  --  120* 120* 122* 120*  --  119*  CO2  --  15* 16* 17* 18*  --  21*  GLUCOSE  --  122* 117* 139* 111*  --  116*  BUN  --  5* <5* <5* 6  --  <5*  CREATININE  --  0.63 0.64 0.75 0.67  --  0.67  CALCIUM  --  7.7* 8.0* 8.4* 8.0*  --  8.4*  MG 1.6*  --  2.1  --   --  1.9  --   PHOS  --   --  2.3*  --   --  2.6  --    Liver Function Tests: Recent Labs  Lab 07/22/17 1230 07/22/17 1717 07/22/17 2157 07/23/17 0044 07/23/17 0848  AST 14* 15 13* 12* 14*  ALT 14 13* 13* 12* 12*  ALKPHOS 52 51 48 42 54  BILITOT 0.2* 0.4 0.3 0.2* 0.3  PROT 5.8* 5.7* 5.6* 4.8* 5.5*  ALBUMIN 3.4* 3.3* 3.2* 2.8* 3.3*   CBC: Recent Labs  Lab 07/21/17 2302 07/23/17 0044 07/23/17 0848  WBC 6.8 5.8 5.4  NEUTROABS 3.6 2.9  --   HGB 11.8* 10.4* 11.6*  HCT 38.2 32.3* 36.7  MCV 89.3 86.1 87.4  PLT 166 157 154   Cardiac Enzymes: Recent Labs  Lab 07/22/17 0732  TROPONINI <0.03     CBG: Recent Labs  Lab 07/22/17 1625 07/22/17 2010 07/22/17 2347 07/23/17 0354 07/23/17 0738  GLUCAP 103* 114* 104* 90 98    Principal Problem:   Schizoaffective disorder, bipolar type by history(HCC) Active Problems:   Seizure disorder (HCC)   Drug overdose   Overdose   Metabolic acidosis   Time coordinating discharge: 35 minutes  Signed:  [name] Hodgkins, MD [facility] 07/23/2017, 3:01 PM

## 2017-07-23 NOTE — Progress Notes (Signed)
[name] is a 27 y.o. female patient admitted from ED awake, alert - oriented  X 4 - no acute distress noted.  VSS - Blood pressure 119/60, pulse 64, temperature 97.8 F (36.6 C), temperature source Oral, resp. rate 18, height 5\' 4"  (1.626 m), weight 86 kg (189 lb 9.5 oz), SpO2 100 %, not currently breastfeeding.    IV in place, occlusive dsg intact without redness.  Orientation to room, and floor completed with information packet given to patient/family.  Patient declined safety video at this time.  Admission INP armband ID verified with patient/family, and in place.   SR up x 2, fall assessment complete, with patient and family able to verbalize understanding of risk associated with falls, and verbalized understanding to call nsg before up out of bed.  Call light within reach, patient able to voice, and demonstrate understanding.  Skin, clean-dry- intact without evidence of bruising, or skin tears.   No evidence of skin break down noted on exam.     Will cont to eval and treat per MD orders.  Luci Bank, RN 07/23/2017 4:01 PM

## 2017-07-23 NOTE — Progress Notes (Signed)
Spoke with hospitalist and ABG and Chemestry looking ok Will sign off Ok to transfer out of ICU Please call us back if needed  Bonnie

## 2017-07-23 NOTE — Progress Notes (Signed)
  PROGRESS NOTE  [name] [id number] DOB: 06/25/1990 DOA: 07/21/2017 PCP: Kathyrn Lass, MD  Brief Narrative: 27 year old woman presented with intentional overdose with Tylenol, risperidone, sertraline, Topamax, Keppra. Admitted for treatment for Tylenol overdose.  Assessment/Plan Intentional overdose of Tylenol, risperidone, sertraline, Topamax, Keppra. --Fortunately no apparent sequela.  Telemetry sinus rhythm, hemodynamics stable, follow-up laboratory studies reassuring.  Case discussed with poison control who recommends no further follow-up or testing at this point.   --Patient medically clear  Hyperchloremic metabolic acidosis --Essentially resolved.    Prolonged QT --Resolved with supportive care.  Bipolar, schizoaffective disorder, depression, personality disorder --meds on hold.  Psychiatry consultation placed.  PMH prolactinoma, seizure d/o.   Psychiatry consultation placed.  Patient is medically clear.  Anticipate need for inpatient psychiatric treatment.  Currently the patient is agreeable.  Discussed with nursing, the patient may not leave, if she attempts to do so security should be called and IVC placed.  DVT prophylaxis: SCDs Code Status: Full Family Communication: none Disposition Plan: pending    [name] Hodgkins, MD  [facility] Direct contact: [phone] --Via amion app OR  --[email or web address]; password [email or web address]  7PM-7AM contact night coverage as above 07/23/2017, 11:31 AM  LOS: 1 day   Consultants:  PCCM  Psych  Procedures:    Antimicrobials:    Interval history/Subjective: S: feels fine today. No complaints. No n/v. No pain. No issues per RN, no seizures, no events on telemetry.  Objective: Vitals:  Vitals:   07/23/17 1000 07/23/17 1100  BP: 111/74 116/71  Pulse: 76 82  Resp: (!) 24 20  Temp:    SpO2: 100% 99%    Exam:   Constitutional:   Appears calm and comfortable ENMT:   grossly normal hearing    Respiratory:   CTA bilaterally, no w/r/r.   Respiratory effort normal Cardiovascular:   RRR, no m/r/g  No LE extremity edema   Psychiatric:   judgement and insight appear impaired  Mental status ? Mood difficult to gauge, affect flat     I have personally reviewed the following:   BMP unremarkable.  CO2 21.  LFTs within normal limits.  INR trending down, 1.27.  CBC unremarkable.  EKG independently reviewed, sinus rhythm, no acute changes.  Normal QTC and QRS.  Telemetry sinus rhythm, no acute changes G7.3 45/29/111     Scheduled Meds: Continuous Infusions: .  sodium bicarbonate (isotonic) infusion in sterile water 100 mL/hr at 07/23/17 1100    Principal Problem:   Drug overdose Active Problems:   Schizoaffective disorder, bipolar type by history(HCC)   Seizure disorder (HCC)   Overdose   Metabolic acidosis   LOS: 1 day

## 2017-07-23 NOTE — Progress Notes (Signed)
Pt requesting to speak with psychiatrist RN called Dr. Mariea Clonts who was no longer at the hospital and did not know of any Dr. who could come see pt at this time but that someone could come tomorrow at some point to speak with pt .Dr Sarajane Jews made aware will continue to monitor

## 2017-07-23 NOTE — Progress Notes (Signed)
First attempt to call report. Receiving RN is at lunch. Call back number left with secretary.

## 2017-07-23 NOTE — Progress Notes (Signed)
PULMONARY / CRITICAL CARE MEDICINE  Name: [name] MRN: [id number] DOB: [birth year]    ADMISSION DATE:  07/21/2017 CONSULTATION DATE:  07/22/17  REFERRING MD :  Hal Hope  CHIEF COMPLAINT:  Overdose   HISTORY OF PRESENT ILLNESS:  [name] is a 27 y.o. female with a PMH as outlined below.  She presented to [facility] ED night of 07/21/17 after family called EMS due to her feeling uneasy.  Pt has depression and apparently had suicide attempt with multiple pill ingestions at roughly 2130 that night.  Pills included 60 tabs of 500mg  Tylenol, ~40 - 50 tabs 2mg  Risperdal, 30 tabs 100mg  Zoloft, 30 tabs 100mg  Topamax, 40 tabs 500mg  Keppra.  In ED, she was borderline hypotensive and somewhat somnolent.  Poison control was contacted and recommended N-acetylcysteine as well as q4hr QTc and QRS monitoring, repeat LFT's and APAP levels in 24 hours.  If QRS is > 192ms then might need HCO3 pushes at dose of 2mg /kg until QRS normalizes.  She was initially admitted to SDU by [facility]; however, due to somnolence and metabolic acidosis (3.84 / 30 / 100), PCCM was asked to see in consultation.  07/23/17 Patient was seen in consultation Patient is doing well QRS<100 LFTs ok No BMP or ABG done in am Patient sitting up in chair asking when can she go home    Prior to Admission medications   Medication Sig Start Date End Date Taking? Authorizing Provider  acetaminophen (TYLENOL) 500 MG tablet Take 1,000 mg by mouth every 6 (six) hours as needed for mild pain, moderate pain or headache.    [provider]  albuterol (PROVENTIL HFA;VENTOLIN HFA) 108 (90 Base) MCG/ACT inhaler Inhale 1-2 puffs into the lungs every 6 (six) hours as needed for wheezing or shortness of breath.    [provider]  b complex vitamins tablet Take 1 tablet by mouth daily.    [provider]  cabergoline (DOSTINEX) 0.5 MG tablet Take 0.5 tablets (0.25 mg total) by mouth 2 (two) times a week. Patient not taking:  Reported on 06/30/2017 08/20/16   Chancy Milroy, MD  Coenzyme Q10 (COQ-10) 100 MG CAPS Take 1 tablet by mouth daily.    [provider]  fluticasone-salmeterol (ADVAIR HFA) 115-21 MCG/ACT inhaler Inhale 2 puffs into the lungs 2 (two) times daily. 08/03/15   Timmothy Euler, MD  HYDROcodone-acetaminophen (NORCO/VICODIN) 5-325 MG tablet Take 1 tablet by mouth every 6 (six) hours as needed for severe pain. 07/14/17   Carmin Muskrat, MD  levETIRAcetam (KEPPRA) 500 MG tablet TAKE 1 TABLET BY MOUTH TWICE A DAY 05/06/17   Pieter Partridge, DO  levonorgestrel-ethinyl estradiol (JOLESSA) 0.15-0.03 MG tablet Take 1 tablet by mouth daily. Patient not taking: Reported on 06/30/2017 08/09/16   Chancy Milroy, MD  magnesium gluconate (MAGONATE) 500 MG tablet Take 500 mg by mouth daily.    [provider]  Multiple Vitamin (MULTIVITAMIN) tablet Take 1 tablet by mouth daily.    [provider]  ondansetron (ZOFRAN ODT) 4 MG disintegrating tablet Take 1 tablet (4 mg total) by mouth every 8 (eight) hours as needed for nausea or vomiting. 07/14/17   Carmin Muskrat, MD  risperiDONE (RISPERDAL) 2 MG tablet Take 2 mg by mouth 2 (two) times daily.     [provider]  sertraline (ZOLOFT) 100 MG tablet Take 100 mg by mouth daily.    [provider]  topiramate (TOPAMAX) 100 MG tablet Take 1 tablet (100 mg total) by  mouth at bedtime. 09/04/16   Pieter Partridge, DO    SUBJECTIVE: Vitals stable, no complaints.  She is sleepy but easily arouses to voice and answers questions appropriately.  VITAL SIGNS: Temp:  [97.7 F (36.5 C)-98.5 F (36.9 C)] 98 F (36.7 C) (06/18 0740) Pulse Rate:  [59-92] 70 (06/18 0900) Resp:  [12-24] 19 (06/18 0900) BP: (99-123)/(50-78) 99/50 (06/18 0900) SpO2:  [98 %-100 %] 100 % (06/18 0900) Weight:  [86 kg (189 lb 9.5 oz)] 86 kg (189 lb 9.5 oz) (06/18 0600)  PHYSICAL EXAMINATION: General: Young female, resting in bed, in NAD. Neuro: Sleepy but  easily arouses to voice.  A&O x 3 with no focal deficits. HEENT: [place]/AT. Sclerae anicteric, EOMI. Cardiovascular: RRR, no M/R/G.  Lungs: Respirations even and unlabored.  CTA bilaterally, No W/R/R. Abdomen: BS x 4, soft, NT/ND.  Musculoskeletal: No gross deformities, no edema.  Skin: Intact, warm, no rashes.   Recent Labs  Lab 07/22/17 1230 07/22/17 1717 07/22/17 2157  NA 143 145 144  K 3.7 3.7 3.5  CL 120* 122* 120*  CO2 16* 17* 18*  BUN <5* <5* 6  CREATININE 0.64 0.75 0.67  GLUCOSE 117* 139* 111*   Recent Labs  Lab 07/21/17 2302 07/23/17 0044 07/23/17 0848  HGB 11.8* 10.4* 11.6*  HCT 38.2 32.3* 36.7  WBC 6.8 5.8 5.4  PLT 166 157 154   No results found.  STUDIES:  None.  SIGNIFICANT EVENTS  6/17 > admit.  ASSESSMENT / PLAN:  Intentional medication overdose as part of suicide attempt - Per EDP notes, pt admitted to ingesting  60 tabs of 500mg  Tylenol, ~40 - 50 tabs 2mg  Risperdal, 30 tabs 100mg  Zoloft, 30 tabs 100mg  Topamax, 40 tabs 500mg  Keppra.  Poison control was contacted by admitting team and gave recommendations as below. Plan: Continue N-Acetylcysteine per protocol. F/u APAP and LFT's  QTc and QRS monitoring q4hrs until normalize - if QRS > 138ms then give HCO3 boluses at dose of 2mg /kg.<patient is already on bicarb check BMP and ABG if they are ok and if poison cotrol is ok -- it can be stopped and give 1/2 NS fluids, Avoid too much cl as bicarb getting low with that>  Overall doing well follow up on BMP and ABG if ok -- Transfer out of ICU-- Patient is currently under hospitalist service Suicide precautions. Psychiatry consulted per primary and dispo per psych and primary  Hyperchloremic metabolic acidosis - due to overdose + saline. Hypomagnesemia - s/p repletion. Pending am  Plan: As above check BMP and adjust fluids accordingly   Skin/Wound: chronic change  Electrolytes: Replace electrolytes per ICU electrolyte replacement protocol.   IVF: Bicarb  fluids  Nutrition: diet as tolerated   Prophylaxis: DVT Prophylaxis-scd  Restraints: none, sitter at bedside  PT/OT eval and treat. OOB when appropriate.   Lines/Tubes:  No  foley  No  central line.  ADVANCE DIRECTIVE:Full code  FAMILY DISCUSSION:spoke with patient   Quality Care: PPI, DVT prophylaxis, HOB elevated, Infection control all reviewed and addressed.  Events and notes from last 24 hours reviewed. Care plan discussed on multidisciplinary rounds  CC TIME:31 min   Old records reviewed discussed results and management plan with patient  Images personally reviewed and results and labs reviewed and discussed with patient.  All medication reviewed and adjusted  Further management depending on test results and work up as outlined above.   ** patient is doing well will sign off, Paged hospitalist to call back-- follow up  on ABG and BMP if any other issues call us back Lahoma Rocker, M.D

## 2017-07-24 ENCOUNTER — Other Ambulatory Visit: Payer: Self-pay

## 2017-07-24 ENCOUNTER — Encounter: Payer: Self-pay | Admitting: Psychiatry

## 2017-07-24 ENCOUNTER — Inpatient Hospital Stay
Admission: AD | Admit: 2017-07-24 | Discharge: 2017-07-31 | DRG: 885 | Disposition: A | Discharge status: Home / Self Care | Payer: PPO | Attending: Psychiatry | Admitting: Psychiatry

## 2017-07-24 DIAGNOSIS — Z87891 Personal history of nicotine dependence: Secondary | ICD-10-CM

## 2017-07-24 DIAGNOSIS — Z91013 Allergy to seafood: Secondary | ICD-10-CM

## 2017-07-24 DIAGNOSIS — G43909 Migraine, unspecified, not intractable, without status migrainosus: Secondary | ICD-10-CM | POA: Diagnosis present

## 2017-07-24 DIAGNOSIS — G40909 Epilepsy, unspecified, not intractable, without status epilepticus: Secondary | ICD-10-CM

## 2017-07-24 DIAGNOSIS — Z91030 Bee allergy status: Secondary | ICD-10-CM

## 2017-07-24 DIAGNOSIS — Z818 Family history of other mental and behavioral disorders: Secondary | ICD-10-CM | POA: Diagnosis not present

## 2017-07-24 DIAGNOSIS — F845 Asperger's syndrome: Secondary | ICD-10-CM | POA: Diagnosis present

## 2017-07-24 DIAGNOSIS — T50902D Poisoning by unspecified drugs, medicaments and biological substances, intentional self-harm, subsequent encounter: Secondary | ICD-10-CM

## 2017-07-24 DIAGNOSIS — E221 Hyperprolactinemia: Secondary | ICD-10-CM | POA: Diagnosis present

## 2017-07-24 DIAGNOSIS — F250 Schizoaffective disorder, bipolar type: Secondary | ICD-10-CM

## 2017-07-24 DIAGNOSIS — Z882 Allergy status to sulfonamides status: Secondary | ICD-10-CM

## 2017-07-24 DIAGNOSIS — T50902A Poisoning by unspecified drugs, medicaments and biological substances, intentional self-harm, initial encounter: Secondary | ICD-10-CM | POA: Diagnosis present

## 2017-07-24 DIAGNOSIS — G4700 Insomnia, unspecified: Secondary | ICD-10-CM | POA: Diagnosis not present

## 2017-07-24 DIAGNOSIS — T391X2A Poisoning by 4-Aminophenol derivatives, intentional self-harm, initial encounter: Secondary | ICD-10-CM | POA: Diagnosis present

## 2017-07-24 DIAGNOSIS — Z915 Personal history of self-harm: Secondary | ICD-10-CM

## 2017-07-24 DIAGNOSIS — G250 Essential tremor: Secondary | ICD-10-CM | POA: Diagnosis not present

## 2017-07-24 DIAGNOSIS — Z7951 Long term (current) use of inhaled steroids: Secondary | ICD-10-CM

## 2017-07-24 DIAGNOSIS — R634 Abnormal weight loss: Secondary | ICD-10-CM | POA: Diagnosis not present

## 2017-07-24 DIAGNOSIS — J45909 Unspecified asthma, uncomplicated: Secondary | ICD-10-CM | POA: Diagnosis present

## 2017-07-24 DIAGNOSIS — Z9102 Food additives allergy status: Secondary | ICD-10-CM

## 2017-07-24 DIAGNOSIS — F840 Autistic disorder: Secondary | ICD-10-CM | POA: Diagnosis not present

## 2017-07-24 DIAGNOSIS — Z9114 Patient's other noncompliance with medication regimen: Secondary | ICD-10-CM | POA: Diagnosis not present

## 2017-07-24 DIAGNOSIS — Z888 Allergy status to other drugs, medicaments and biological substances status: Secondary | ICD-10-CM | POA: Diagnosis not present

## 2017-07-24 DIAGNOSIS — Z885 Allergy status to narcotic agent status: Secondary | ICD-10-CM

## 2017-07-24 DIAGNOSIS — F419 Anxiety disorder, unspecified: Secondary | ICD-10-CM | POA: Diagnosis present

## 2017-07-24 DIAGNOSIS — F603 Borderline personality disorder: Secondary | ICD-10-CM | POA: Diagnosis not present

## 2017-07-24 DIAGNOSIS — R000 Tachycardia, unspecified: Secondary | ICD-10-CM

## 2017-07-24 DIAGNOSIS — T50901A Poisoning by unspecified drugs, medicaments and biological substances, accidental (unintentional), initial encounter: Secondary | ICD-10-CM | POA: Diagnosis present

## 2017-07-24 DIAGNOSIS — F4310 Post-traumatic stress disorder, unspecified: Secondary | ICD-10-CM | POA: Diagnosis present

## 2017-07-24 DIAGNOSIS — Z148 Genetic carrier of other disease: Secondary | ICD-10-CM | POA: Diagnosis not present

## 2017-07-24 DIAGNOSIS — Z6829 Body mass index (BMI) 29.0-29.9, adult: Secondary | ICD-10-CM | POA: Diagnosis not present

## 2017-07-24 DIAGNOSIS — Z79899 Other long term (current) drug therapy: Secondary | ICD-10-CM

## 2017-07-24 MED ORDER — ALUM & MAG HYDROXIDE-SIMETH 200-200-20 MG/5ML PO SUSP: 30.0000 mL | ORAL | Status: DC | PRN

## 2017-07-24 MED ORDER — HYDROXYZINE HCL 50 MG PO TABS
50.0000 mg | ORAL_TABLET | Freq: Three times a day (TID) | ORAL | Status: DC | PRN
  Administered 2017-07-26 – 2017-07-28 (×2): 50 mg via ORAL
  Filled 2017-07-24 (×2): qty 1

## 2017-07-24 MED ORDER — LEVETIRACETAM 500 MG PO TABS
500.0000 mg | ORAL_TABLET | Freq: Two times a day (BID) | ORAL | Status: DC
  Administered 2017-07-25 – 2017-07-31 (×12): 500 mg via ORAL
  Filled 2017-07-24 (×13): qty 1

## 2017-07-24 MED ORDER — SERTRALINE HCL 100 MG PO TABS
100.0000 mg | ORAL_TABLET | Freq: Every day | ORAL | Status: DC
  Filled 2017-07-24: qty 1

## 2017-07-24 MED ORDER — MAGNESIUM HYDROXIDE 400 MG/5ML PO SUSP: 30.0000 mL | Freq: Every day | ORAL | Status: DC | PRN

## 2017-07-24 MED ORDER — ACETAMINOPHEN 325 MG PO TABS
650.0000 mg | ORAL_TABLET | Freq: Four times a day (QID) | ORAL | Status: DC | PRN
  Filled 2017-07-24: qty 2

## 2017-07-24 MED ORDER — RISPERIDONE 1 MG PO TABS
2.0000 mg | ORAL_TABLET | Freq: Two times a day (BID) | ORAL | Status: DC
  Administered 2017-07-24: 2 mg via ORAL
  Filled 2017-07-24 (×2): qty 2

## 2017-07-24 MED ORDER — TOPIRAMATE 100 MG PO TABS
100.0000 mg | ORAL_TABLET | Freq: Every day | ORAL | Status: DC
  Administered 2017-07-24 – 2017-07-30 (×7): 100 mg via ORAL
  Filled 2017-07-24 (×8): qty 1

## 2017-07-24 MED ORDER — TRAZODONE HCL 100 MG PO TABS
100.0000 mg | ORAL_TABLET | Freq: Every evening | ORAL | Status: DC | PRN
Start: 2017-07-24 — End: 2017-07-25

## 2017-07-24 NOTE — Progress Notes (Signed)
Pt requested to have her emergency contacts updated. Pt only want the first 3 emergency updated with her care. She specifically asked that her information be only shared with your three inl-aws on file. Thank you.

## 2017-07-24 NOTE — Progress Notes (Signed)
Report called to [facility]

## 2017-07-24 NOTE — Progress Notes (Signed)
Nutrition Brief Note  Patient identified on the Malnutrition Screening Tool (MST) Report   Patient with PMH pancreatitis, presented with intentional overdose with Tylenol, risperidone, sertraline, Topamax, Keppra.  Admitted for treatment for Tylenol overdose.   She was previously admitted with acute pancreatitis on 5/27 and states she has been eating a vegetable or a fruit for lunch and a vegetable or a fruit for dinner since then. Also reports a 24 pound weight loss over 1 year from 298 to 270. Per chart review, she did exhibit some weight loss from her pancreatitis admission but appears to have regained weight since then. Eating well this admission, PO 75-100% at each meal. No needs. Awaiting behavioral health bed placement.  Wt Readings from Last 15 Encounters:  07/23/17 189 lb 9.5 oz (86 kg)  07/14/17 177 lb (80.3 kg)  06/30/17 185 lb 10 oz (84.2 kg)  12/13/16 200 lb (90.7 kg)  11/22/16 201 lb (91.2 kg)  09/04/16 197 lb 14.4 oz (89.8 kg)  08/20/16 198 lb 14.4 oz (90.2 kg)  08/09/16 197 lb 1.6 oz (89.4 kg)  04/19/16 180 lb (81.6 kg)  03/28/16 190 lb (86.2 kg)  03/26/16 193 lb (87.5 kg)  03/16/16 207 lb (93.9 kg)  03/14/16 207 lb 6.4 oz (94.1 kg)  03/10/16 207 lb (93.9 kg)  03/08/16 204 lb 12 oz (92.9 kg)    Body mass index is 32.54 kg/m. Patient meets criteria for obese calss I based on current BMI.   Current diet order is regular. Labs and medications reviewed.   No nutrition interventions warranted at this time. If nutrition issues arise, please consult RD.   Satira Anis. Jaisean Monteforte, MS, RD LDN Inpatient Clinical Dietitian Pager [phone]

## 2017-07-24 NOTE — Progress Notes (Addendum)
[facility] [phone]) is able to accept patient. They will call PM CSW ([phone]) with bed assignment later ronight. CSW faxing them IVC paperwork. Will require a Sheriff transport [phone] or [phone])   Cedric Fishman LCSW [phone]

## 2017-07-24 NOTE — Progress Notes (Addendum)
Left message for social worker to see if transportation has been contacted.  Contacted sheriffs department at number in notes and left message.

## 2017-07-24 NOTE — Progress Notes (Signed)
Pt requesting to speak with psychiatrist Rn paged Dr. Mariea Clonts will continue to monitor

## 2017-07-24 NOTE — Progress Notes (Signed)
Progress Note    [name]  YYT:[id number] DOB: [birth year]  DOA: 07/21/2017 PCP: Kathyrn Lass, MD    Brief Narrative:   Chief complaint: F/U overdose.  Medical records reviewed and are as summarized below:  [name] is an 27 y.o. female with a PMH of schizoaffective disorder, prolactinoma, and pancreatitis who was admitted 07/21/2017 for treatment of a suicide attempt/intentional drug overdose with Tylenol, Risperdal, Zoloft, Topamax, and Keppra.  Assessment/Plan:   Principal Problem:   Drug overdose of Tylenol, Risperdal, Zoloft, Topamax and Keppra Multiple drug overdose/intentional associated with hypotension, acidosis and hypokalemia/hypomagnesemia noted.  Tylenol level elevated on admission.  Poison control recommended N-acetylcysteine for Tylenol overdose which was given.  Medically clear as of 07/23/2017.  Awaiting transfer to inpatient psychiatry when bed available.  Continue Engineer, materials.  Active Problems:   Schizoaffective disorder, bipolar type by history(HCC) Psychiatrist recommends holding home medications at this time.    History of seizure disorder ([place]) Keppra remains on hold at this time.  Continue seizure precautions.    Metabolic acidosis Improved and stable.    Obesity Body mass index is 32.54 kg/m.   Family Communication/Anticipated D/C date and plan/Code Status   DVT prophylaxis: SCDs ordered. Code Status: Full Code.  Family Communication: No family at the bedside. [facility]   Medical Consultants:    PCCM  Psychiatry   Anti-Infectives:    None  Subjective:   No physical complaints.  Anxious that her usual meds have not been re-started.  Says that her overdose was "stupid".  Objective:    Vitals:   07/23/17 1300 07/23/17 1559 07/23/17 2215 07/24/17 0539  BP: 116/65 119/60 115/65 120/68  Pulse: 64  90 (!) 55  Resp: 19 18 16 16   Temp:  97.8 F (36.6 C) 98.4 F (36.9 C)     TempSrc:  Oral Oral   SpO2: 99% 100% 100% 99%  Weight:      Height:        Intake/Output Summary (Last 24 hours) at 07/24/2017 0837 Last data filed at 07/23/2017 1100 Gross per 24 hour  Intake 671.05 ml  Output -  Net 671.05 ml   Filed Weights   07/21/17 2233 07/23/17 0600  Weight: 80.3 kg (177 lb) 86 kg (189 lb 9.5 oz)    Exam: General: No acute distress. Cardiovascular: Heart sounds show a regular rate, and rhythm. No gallops or rubs. No murmurs. No JVD. Lungs: Clear to auscultation bilaterally with good air movement. No rales, rhonchi or wheezes. Abdomen: Soft, nontender, nondistended with normal active bowel sounds. No masses. No hepatosplenomegaly. Psychiatric: Mood and affect are anxious/flat. Insight and judgment are poor.   Data Reviewed:   I have personally reviewed following labs and imaging studies:  Labs: Labs show the following:   Basic Metabolic Panel: Recent Labs  Lab 07/22/17 0134 07/22/17 0610 07/22/17 1230 07/22/17 1717 07/22/17 2157 07/23/17 0044 07/23/17 0848  NA  --  143 143 145 144  --  145  K  --  3.4* 3.7 3.7 3.5  --  3.6  CL  --  120* 120* 122* 120*  --  119*  CO2  --  15* 16* 17* 18*  --  21*  GLUCOSE  --  122* 117* 139* 111*  --  116*  BUN  --  5* <5* <5* 6  --  <5*  CREATININE  --  0.63 0.64 0.75 0.67  --  0.67  CALCIUM  --  7.7* 8.0* 8.4* 8.0*  --  8.4*  MG 1.6*  --  2.1  --   --  1.9  --   PHOS  --   --  2.3*  --   --  2.6  --    GFR Estimated Creatinine Clearance: 111.1 mL/min (by C-G formula based on SCr of 0.67 mg/dL). Liver Function Tests: Recent Labs  Lab 07/22/17 1230 07/22/17 1717 07/22/17 2157 07/23/17 0044 07/23/17 0848  AST 14* 15 13* 12* 14*  ALT 14 13* 13* 12* 12*  ALKPHOS 52 51 48 42 54  BILITOT 0.2* 0.4 0.3 0.2* 0.3  PROT 5.8* 5.7* 5.6* 4.8* 5.5*  ALBUMIN 3.4* 3.3* 3.2* 2.8* 3.3*   Coagulation profile Recent Labs  Lab 07/22/17 1230 07/22/17 1717 07/22/17 2157 07/23/17 0044 07/23/17 0848  INR  1.37 1.36 1.34 1.38 1.27    CBC: Recent Labs  Lab 07/21/17 2302 07/23/17 0044 07/23/17 0848  WBC 6.8 5.8 5.4  NEUTROABS 3.6 2.9  --   HGB 11.8* 10.4* 11.6*  HCT 38.2 32.3* 36.7  MCV 89.3 86.1 87.4  PLT 166 157 154   Cardiac Enzymes: Recent Labs  Lab 07/22/17 0732  TROPONINI <0.03   CBG: Recent Labs  Lab 07/22/17 1625 07/22/17 2010 07/22/17 2347 07/23/17 0354 07/23/17 0738  GLUCAP 103* 114* 104* 90 98   Sepsis Labs: Recent Labs  Lab 07/21/17 2302 07/22/17 0732 07/22/17 0943 07/23/17 0044 07/23/17 0848  WBC 6.8  --   --  5.8 5.4  LATICACIDVEN  --  1.4 1.8 1.4  --     Microbiology Recent Results (from the past 240 hour(s))  MRSA PCR Screening     Status: None   Collection Time: 07/22/17  8:23 AM  Result Value Ref Range Status   MRSA by PCR NEGATIVE NEGATIVE Final    Comment:        The GeneXpert MRSA Assay (FDA approved for NASAL specimens only), is one component of a comprehensive MRSA colonization surveillance program. It is not intended to diagnose MRSA infection nor to guide or monitor treatment for MRSA infections. Performed at [facility], [place] [address] [place], [place] [address]     Procedures and diagnostic studies:  No results found.  Medications:    Continuous Infusions:   LOS: 2 days   Jacquelynn Cree  [facility] Pager [phone]. If unable to reach me by pager, please call my cell phone at [phone].  *Please refer to [email or web address], password [email or web address] to get updated schedule on who will round on this patient, as hospitalists switch teams weekly. If 7PM-7AM, please contact night-coverage at [email or web address], password [email or web address] for any overnight needs.  07/24/2017, 8:37 AM

## 2017-07-24 NOTE — Progress Notes (Signed)
CSW received phone call back from pt's emergency contact, [name]. [name] provided as contact from pt and permission given to reach out to from pt. [name] confirmed that pt has no legal guardian. [name] asking for medical information. CSW informed, [name] that staff cannot share that over the phone, but pt can reach out to her to answer questions.   [name], [name]  [phone]

## 2017-07-24 NOTE — Progress Notes (Signed)
[facility] ready for transfer.  IV sites removed, AVS signed, and 2 bags of property returned to patient.  Patient inspected valuables and everything on the list was accounted for.  Double checked by sitter, Naaman Plummer NT.  Patient released to sherrif"s office for transport.  Update called to [facility] behavioral.

## 2017-07-24 NOTE — Progress Notes (Signed)
CSW contacted [facility] regarding referral. Calvin (Intake) will contact CSW back with an update.  Percell Locus Bellarae Lizer LCSW [phone]

## 2017-07-24 NOTE — BH Assessment (Signed)
Patient has been accepted to [facility].  Accepting physician is Dr. Bary Leriche.  Attending Physician will be Dr. Bary Leriche.  Patient has been assigned to room 302, by Austin F.  Call report to [phone].  Representative/Transfer Coordinator is Dispensing optician Patient pre-admitted by [facility] Patient Access Genella Rife)  [facility] ([place], Social Worker) made aware of acceptance.

## 2017-07-24 NOTE — Progress Notes (Addendum)
PM ED CSW called Calvin with [facility]. Pt is going to bed 302. Pt can be transported anytime after 8 pm.   Number for Report: [phone]   Pt will need to be transported by [facility] [phone] or [phone]).   Chart indicates pt has a legal guardian. CSW looking into that, placed call to after hours DSS, waiting call back from on call social worker. [place] is not the pt's legal guardian. With pt's permission, CSW reached out to mother in law, [place], [phone]. CSW left voicemail. Number listed for legal guardian is a wrong number.   No mention of legal guardian in previous notes during pt's visit.   Update: CSW left voicemails checking on status of transportation for pt. [facility] unable to accept pt until 8 pm.   CSW called floor nurse back, unable to reach rn.  Update 9:15: Per sheriffs department, stops accepting transportation request after 8 pm.   CSW called [place] at [phone] and updated Sam about transportation issues. [facility] no issues with accepting pt tomorrow.   Update 9:33 Pt picked up and transported to [facility] [place] aware.   [name], [name]  [phone]

## 2017-07-24 NOTE — Progress Notes (Deleted)
[facility] ED CSW called Sheriff's Department, sheriff's department no longer transporting pts after 8. Called updated Rosalie. Pt can come at 10 am tomorrow. Attempted to call rn to update.   [name], [name]  [phone]

## 2017-07-24 NOTE — Progress Notes (Signed)
[name] was seen today per request. She reports, "they are spreading rumors about me that I'm hallucinating and delusional." She was informed that this is not the case and the treatment team is currently looking for a bed for further care. She inquired about the bed search process and this process was explained to her. She is aware that her medications will not be restarted at this time due to recent overdose. She had no additional questions.  Buford Dresser, DO 07/24/17 5:26 PM

## 2017-07-24 NOTE — Discharge Summary (Signed)
Physician Discharge Summary  [name] KVQ:[id number] DOB: 04/24/1990 DOA: 07/21/2017  PCP: Sigmund Hazel, MD  Admit date: 07/21/2017 Discharge date: 07/24/2017  Admitted From: Home Discharge disposition: Psychiatric Hospital   Recommendations for Outpatient Follow-Up:   1. Being discharged to an inpatient psychiatric hospital.   Discharge Diagnosis:   Principal Problem:   Drug overdose Active Problems:   Schizoaffective disorder, bipolar type (HCC)   Seizure disorder (HCC)   Overdose   Metabolic acidosis    Discharge Condition: Improved.  Diet recommendation: Low sodium, heart healthy.  Carbohydrate-modified.  Regular.  Wound care: None.  Code status: Full.   History of Present Illness:   [name] is an 27 y.o. female with a PMH of schizoaffective disorder, prolactinoma, and pancreatitis who was admitted 07/21/2017 for treatment of a suicide attempt/intentional drug overdose with Tylenol, Risperdal, Zoloft, Topamax, and Keppra.  Hospital Course by Problem:   Principal Problem:   Drug overdose of Tylenol, Risperdal, Zoloft, Topamax and Keppra Multiple drug overdose/intentional associated with hypotension, acidosis and hypokalemia/hypomagnesemia noted.  Tylenol level elevated on admission.  Poison control recommended N-acetylcysteine for Tylenol overdose which was given.  Medically clear as of 07/23/2017.  Medically stable.  Active Problems:   Schizoaffective disorder, bipolar type by history(HCC) Psychiatrist recommends holding home medications at this time.    History of seizure disorder (HCC) Keppra remains on hold at this time.  Continue seizure precautions.    Metabolic acidosis Improved and stable.    Obesity Body mass index is 32.54 kg/m.   Medical Consultants:    PCCM  Psychiatry   Discharge Exam:   Vitals:   07/24/17 0539 07/24/17 1335  BP: 120/68 127/76  Pulse: (!) 55 67  Resp: 16 18  Temp:  98.4 F (36.9 C)  SpO2:  99%    Vitals:   07/23/17 1559 07/23/17 2215 07/24/17 0539 07/24/17 1335  BP: 119/60 115/65 120/68 127/76  Pulse:  90 (!) 55 67  Resp: 18 16 16 18   Temp: 97.8 F (36.6 C) 98.4 F (36.9 C)  98.4 F (36.9 C)  TempSrc: Oral Oral  Oral  SpO2: 100% 100% 99%   Weight:      Height:        General exam: Appears calm and comfortable.  Respiratory system: Clear to auscultation. Respiratory effort normal. Cardiovascular system: S1 & S2 heard, RRR. No JVD,  rubs, gallops or clicks. No murmurs. Gastrointestinal system: Abdomen is nondistended, soft and nontender. No organomegaly or masses felt. Normal bowel sounds heard. Central nervous system: Alert and oriented. No focal neurological deficits. Extremities: No clubbing,  or cyanosis. No edema. Skin: No rashes, lesions or ulcers. Psychiatry: Judgement and insight appear impaired. Mood & affect appropriate.    The results of significant diagnostics from this hospitalization (including imaging, microbiology, ancillary and laboratory) are listed below for reference.     Procedures and Diagnostic Studies:   No results found.   Labs:   Basic Metabolic Panel: Recent Labs  Lab 07/22/17 0134 07/22/17 0610 07/22/17 1230 07/22/17 1717 07/22/17 2157 07/23/17 0044 07/23/17 0848  NA  --  143 143 145 144  --  145  K  --  3.4* 3.7 3.7 3.5  --  3.6  CL  --  120* 120* 122* 120*  --  119*  CO2  --  15* 16* 17* 18*  --  21*  GLUCOSE  --  122* 117* 139* 111*  --  116*  BUN  --  5* <5* <  5* 6  --  <5*  CREATININE  --  0.63 0.64 0.75 0.67  --  0.67  CALCIUM  --  7.7* 8.0* 8.4* 8.0*  --  8.4*  MG 1.6*  --  2.1  --   --  1.9  --   PHOS  --   --  2.3*  --   --  2.6  --    GFR Estimated Creatinine Clearance: 111.1 mL/min (by C-G formula based on SCr of 0.67 mg/dL). Liver Function Tests: Recent Labs  Lab 07/22/17 1230 07/22/17 1717 07/22/17 2157 07/23/17 0044 07/23/17 0848  AST 14* 15 13* 12* 14*  ALT 14 13* 13* 12* 12*  ALKPHOS 52 51 48  42 54  BILITOT 0.2* 0.4 0.3 0.2* 0.3  PROT 5.8* 5.7* 5.6* 4.8* 5.5*  ALBUMIN 3.4* 3.3* 3.2* 2.8* 3.3*   No results for input(s): LIPASE, AMYLASE in the last 168 hours. No results for input(s): AMMONIA in the last 168 hours. Coagulation profile Recent Labs  Lab 07/22/17 1230 07/22/17 1717 07/22/17 2157 07/23/17 0044 07/23/17 0848  INR 1.37 1.36 1.34 1.38 1.27    CBC: Recent Labs  Lab 07/21/17 2302 07/23/17 0044 07/23/17 0848  WBC 6.8 5.8 5.4  NEUTROABS 3.6 2.9  --   HGB 11.8* 10.4* 11.6*  HCT 38.2 32.3* 36.7  MCV 89.3 86.1 87.4  PLT 166 157 154   Cardiac Enzymes: Recent Labs  Lab 07/22/17 0732  TROPONINI <0.03   BNP: Invalid input(s): POCBNP CBG: Recent Labs  Lab 07/22/17 1625 07/22/17 2010 07/22/17 2347 07/23/17 0354 07/23/17 0738  GLUCAP 103* 114* 104* 90 98   Microbiology Recent Results (from the past 240 hour(s))  MRSA PCR Screening     Status: None   Collection Time: 07/22/17  8:23 AM  Result Value Ref Range Status   MRSA by PCR NEGATIVE NEGATIVE Final    Comment:        The GeneXpert MRSA Assay (FDA approved for NASAL specimens only), is one component of a comprehensive MRSA colonization surveillance program. It is not intended to diagnose MRSA infection nor to guide or monitor treatment for MRSA infections. Performed at [facility], [address], [place], [place] [address]      Discharge Instructions:   Discharge Instructions    Activity as tolerated - No restrictions   Complete by:  As directed    Call MD for:   Complete by:  As directed    Suicidal thoughts   Diet general   Complete by:  As directed    Diet general   Complete by:  As directed    Increase activity slowly   Complete by:  As directed      Allergies as of 07/24/2017      Reactions   Bee Venom Anaphylaxis   Coconut Flavor Anaphylaxis, Rash   Geodon [ziprasidone Hcl] Other (See Comments)   Pt states that this medication causes paralysis of the mouth.      Haloperidol And Related Other (See Comments)   Pt states that this medication causes paralysis of the mouth.     Lithium Other (See Comments)   Reaction:  Seizure-like activity.    Oxycodone Other (See Comments)   Reaction:  Hallucinations    Seroquel [quetiapine Fumarate] Other (See Comments)   Pt states that this medication is too strong.    Shellfish Allergy Anaphylaxis   Phenergan [promethazine Hcl] Other (See Comments)   Reaction:  Chest pain    Prilosec [  omeprazole] Nausea And Vomiting   Sulfa Antibiotics Other (See Comments)   Chest pain    Tegretol [carbamazepine] Nausea And Vomiting      Medication List    STOP taking these medications   acetaminophen 500 MG tablet Commonly known as:  TYLENOL   b complex vitamins tablet   cabergoline 0.5 MG tablet Commonly known as:  DOSTINEX   CoQ-10 100 MG Caps   HYDROcodone-acetaminophen 5-325 MG tablet Commonly known as:  NORCO/VICODIN   ondansetron 4 MG disintegrating tablet Commonly known as:  ZOFRAN ODT   risperiDONE 2 MG tablet Commonly known as:  RISPERDAL   sertraline 100 MG tablet Commonly known as:  ZOLOFT   topiramate 100 MG tablet Commonly known as:  TOPAMAX     TAKE these medications   albuterol 108 (90 Base) MCG/ACT inhaler Commonly known as:  PROVENTIL HFA;VENTOLIN HFA Inhale 1-2 puffs into the lungs every 6 (six) hours as needed for wheezing or shortness of breath.   fluticasone-salmeterol 115-21 MCG/ACT inhaler Commonly known as:  ADVAIR HFA Inhale 2 puffs into the lungs 2 (two) times daily.   levETIRAcetam 500 MG tablet Commonly known as:  KEPPRA TAKE 1 TABLET BY MOUTH TWICE A DAY   levonorgestrel-ethinyl estradiol 0.15-0.03 MG tablet Commonly known as:  JOLESSA Take 1 tablet by mouth daily.   magnesium gluconate 500 MG tablet Commonly known as:  MAGONATE Take 500 mg by mouth daily.   multivitamin tablet Take 1 tablet by mouth daily.         Time coordinating discharge: 25  minutes.  SignedTrula Ore Kyri Shader  Pager [phone] [facility] 07/24/2017, 5:10 PM

## 2017-07-25 ENCOUNTER — Other Ambulatory Visit: Payer: Self-pay

## 2017-07-25 DIAGNOSIS — F250 Schizoaffective disorder, bipolar type: Principal | ICD-10-CM

## 2017-07-25 LAB — LIPID PANEL
CHOL/HDL RATIO: 3.5 ratio
Cholesterol: 178 mg/dL (ref 0–200)
HDL: 51 mg/dL (ref >40)
LDL Cholesterol: 107 mg/dL — ABNORMAL HIGH (ref 0–99)
TRIGLYCERIDES: 99 mg/dL (ref <150)
VLDL: 20 mg/dL (ref 0–40)

## 2017-07-25 LAB — HEMOGLOBIN A1C
Hgb A1c MFr Bld: 4.9 % (ref 4.8–5.6)
Mean Plasma Glucose: 93.93 mg/dL

## 2017-07-25 LAB — TSH: TSH: 2.1 u[IU]/mL (ref 0.350–4.500)

## 2017-07-25 MED ORDER — ADULT MULTIVITAMIN W/MINERALS CH
1.0000 | ORAL_TABLET | Freq: Every day | ORAL | Status: DC
  Administered 2017-07-26 – 2017-07-31 (×6): 1 via ORAL
  Filled 2017-07-25 (×7): qty 1

## 2017-07-25 MED ORDER — PRAZOSIN HCL 2 MG PO CAPS
2.0000 mg | ORAL_CAPSULE | Freq: Two times a day (BID) | ORAL | Status: DC
  Administered 2017-07-25 – 2017-07-27 (×4): 2 mg via ORAL
  Administered 2017-07-27: 17:00:00 via ORAL
  Administered 2017-07-28 – 2017-07-31 (×6): 2 mg via ORAL
  Filled 2017-07-25 (×13): qty 1

## 2017-07-25 MED ORDER — ENSURE ENLIVE PO LIQD
237.0000 mL | Freq: Two times a day (BID) | ORAL | Status: DC
  Administered 2017-07-25 – 2017-07-31 (×10): 237 mL via ORAL

## 2017-07-25 MED ORDER — SERTRALINE HCL 25 MG PO TABS
150.0000 mg | ORAL_TABLET | Freq: Every day | ORAL | Status: DC
  Administered 2017-07-26 – 2017-07-31 (×6): 150 mg via ORAL
  Filled 2017-07-25 (×6): qty 2

## 2017-07-25 MED ORDER — RISPERIDONE 1 MG PO TABS
2.0000 mg | ORAL_TABLET | Freq: Three times a day (TID) | ORAL | Status: DC
  Administered 2017-07-25 – 2017-07-31 (×17): 2 mg via ORAL
  Filled 2017-07-25 (×21): qty 2

## 2017-07-25 NOTE — Progress Notes (Addendum)
Patient is anxious, disorganized, and restless as she engages this Probation officer. She endorses depression "10/10", and anxiety "10/10", but denies SI/HI/AVH. She refuses to take morning medications, MD made aware. She explains to this Probation officer that she has tried multiple medications and they did "not get a chance to work before her husband died", then later on stated her husband, his parents, and her parents are all against her. She has several crying spells, and continues talking in a tangential manner and starts to tell this writer about her "not being the best mom or the best wife".  Emotional support provided. No self injurious behavior observed or reported. Will continue to monitor for care and safety.
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
## 2017-07-25 NOTE — Progress Notes (Deleted)
Patient refused evening medications, despite much encouragement. Patient finally accepted Keppra, but continued to refuse Risperidone, Prazosin, and her Multivitamin. She also refused all 3 meals today, but is encouraged, and observed drinking all of the Ensure provided.

## 2017-07-25 NOTE — Progress Notes (Signed)
Patient refused evening medications, despite much encouragement. Patient finally accepted Keppra, but continued to refuse Risperidone, Prazosin, and her Multivitamin. She also refused all 3 meals today, but is encouraged, and observed drinking all of the Ensure provided.

## 2017-07-25 NOTE — [facility] Group Notes (Signed)
[facility] Group Notes:  (Nursing/MHT/Case Management/Adjunct)  Date:  07/25/2017  Time:  9:59 PM  Type of Therapy:  Group Therapy  Participation Level:  Did Not Attend  Ivar Drape 07/25/2017, 9:59 PM

## 2017-07-25 NOTE — [facility] Group Notes (Signed)
LCSW Group Therapy Note 07/25/2017 9:00 AM  Type of Therapy and Topic:  Group Therapy:  Setting Goals  Participation Level:  Active  Description of Group: In this process group, patients discussed using strengths to work toward goals and address challenges.  Patients identified two positive things about themselves and one goal they were working on.  Patients were given the opportunity to share openly and support each other's plan for self-empowerment.  The group discussed the value of gratitude and were encouraged to have a daily reflection of positive characteristics or circumstances.  Patients were encouraged to identify a plan to utilize their strengths to work on current challenges and goals.  Therapeutic Goals 1. Patient will verbalize personal strengths/positive qualities and relate how these can assist with achieving desired personal goals 2. Patients will verbalize affirmation of peers plans for personal change and goal setting 3. Patients will explore the value of gratitude and positive focus as related to successful achievement of goals 4. Patients will verbalize a plan for regular reinforcement of personal positive qualities and circumstances.  Summary of Patient Progress:  Zoha actively participated in today's group discussion on setting goals using the SMART Model.  Ellizabeth that a few specific goals that she would like to start working on while in the hospital would be to start eating more and better as well as take her medications as prescribed in order to work on improving her depression symptoms.  Jenesis shared that she will allow members of her ACTT to help her to achieve her goals.     Therapeutic Modalities Cognitive Behavioral Therapy Motivational Interviewing    Devona Konig, [place] 07/25/2017 10:52 AM
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
## 2017-07-25 NOTE — Tx Team (Signed)
Initial Treatment Plan 07/25/2017 1:55 AM [name] ZVJ:[id number]    PATIENT STRESSORS: Loss of husband  Marital or family conflict   PATIENT STRENGTHS: Capable of independent living Child psychotherapist Supportive family/friends   PATIENT IDENTIFIED PROBLEMS: Suicidal Ideation 07/24/17  Depression 07/24/17  Paranoia 07/24/17                 DISCHARGE CRITERIA:  Ability to meet basic life and health needs Adequate post-discharge living arrangements Improved stabilization in mood, thinking, and/or behavior Motivation to continue treatment in a less acute level of care Reduction of life-threatening or endangering symptoms to within safe limits Verbal commitment to aftercare and medication compliance  PRELIMINARY DISCHARGE PLAN: Attend aftercare/continuing care group Outpatient therapy Return to previous living arrangement  PATIENT/FAMILY INVOLVEMENT: This treatment plan has been presented to and reviewed with the patient, [name], and/or family member.  The patient and family have been given the opportunity to ask questions and make suggestions.  Derek Mound, RN 07/25/2017, 1:55 AM

## 2017-07-25 NOTE — Progress Notes (Signed)
Initial Nutrition Assessment  DOCUMENTATION CODES:   Not applicable  INTERVENTION:   Ensure Enlive po BID, each supplement provides 350 kcal and 20 grams of protein  MVI daily  NUTRITION DIAGNOSIS:   Inadequate oral intake related to social / environmental circumstances(depression ) as evidenced by per patient/family report.  GOAL:   Patient will meet greater than or equal to 90% of their needs  MONITOR:   PO intake, Supplement acceptance  REASON FOR ASSESSMENT:   Malnutrition Screening Tool    ASSESSMENT:   27 y.o. female with a PMH of schizoaffective disorder, prolactinoma, and recent pancreatitis 5/26 who was admitted for treatment of a suicide attempt/intentional drug overdose with Tylenol, Risperdal, Zoloft, Topamax, and Keppra.   Met with pt in room today. Pt reports poor appetite and oral intake for several months now that she relates to "being depressed". Pt did have a recent diagnosis of pancreatitis but reports that she had had a poor appetite prior to becoming sick. Pt reports an unintentional wt loss of 20-30lbs over the past year. Per chart, pt weighed 197-198lbs in July 2018; this would be a 9lb(5%) wt loss which is not significant. Pt reports that her appetite remains poor today. Pt is willing to drink chocolate Ensure in hospital. RD will order supplements and MVI.   Medications and labs reviewed   Diet Order:   Diet Order           Diet regular Room service appropriate? Yes; Fluid consistency: Thin  Diet effective now         EDUCATION NEEDS:   Education needs have been addressed  Skin:  Skin Assessment: Reviewed RN Assessment  Last BM:  6/17  Height:   Ht Readings from Last 1 Encounters:  07/25/17 5' 4"  (1.626 m)    Weight:   Wt Readings from Last 1 Encounters:  07/25/17 170 lb (77.1 kg)    Ideal Body Weight:  54.5 kg  BMI:  Body mass index is 29.18 kg/m.  Estimated Nutritional Needs:   Kcal:  1700-2000kcal/day   Protein:   86-95g/day   Fluid:  >1.7L/day   Koleen Distance MS, RD, LDN Pager #- [phone] Office#- [phone] After Hours Pager: [phone]

## 2017-07-25 NOTE — Plan of Care (Signed)
New admit  Problem: Education: Goal: Knowledge of  General Education information/materials will improve Outcome: Not Progressing Goal: Emotional status will improve Outcome: Not Progressing Goal: Mental status will improve Outcome: Not Progressing Goal: Verbalization of understanding the information provided will improve Outcome: Not Progressing   Problem: Activity: Goal: Interest or engagement in activities will improve Outcome: Not Progressing Goal: Sleeping patterns will improve Outcome: Not Progressing   Problem: Coping: Goal: Ability to verbalize frustrations and anger appropriately will improve Outcome: Not Progressing Goal: Ability to demonstrate self-control will improve Outcome: Not Progressing   Problem: Health Behavior/Discharge Planning: Goal: Identification of resources available to assist in meeting health care needs will improve Outcome: Not Progressing Goal: Compliance with treatment plan for underlying cause of condition will improve Outcome: Not Progressing   Problem: Safety: Goal: Periods of time without injury will increase Outcome: Not Progressing   Problem: Education: Goal: Ability to make informed decisions regarding treatment will improve Outcome: Not Progressing   Problem: Coping: Goal: Coping ability will improve Outcome: Not Progressing   Problem: Health Behavior/Discharge Planning: Goal: Identification of resources available to assist in meeting health care needs will improve Outcome: Not Progressing   Problem: Medication: Goal: Compliance with prescribed medication regimen will improve Outcome: Not Progressing   Problem: Self-Concept: Goal: Ability to disclose and discuss suicidal ideas will improve Outcome: Not Progressing Goal: Will verbalize positive feelings about self Outcome: Not Progressing

## 2017-07-25 NOTE — Plan of Care (Signed)
Patient not progressing towards goals. She is educated on the significance of compliance with treatment plan. No evidence of learning. Problem: Elimination: Goal: Will not experience complications related to bowel motility Outcome: Not Progressing   Problem: Education: Goal: Knowledge of [place] General Education information/materials will improve Outcome: Not Progressing Goal: Emotional status will improve Outcome: Not Progressing Goal: Mental status will improve Outcome: Not Progressing Goal: Verbalization of understanding the information provided will improve Outcome: Not Progressing   Problem: Activity: Goal: Interest or engagement in activities will improve Outcome: Not Progressing Goal: Sleeping patterns will improve Outcome: Not Progressing   Problem: Coping: Goal: Ability to verbalize frustrations and anger appropriately will improve Outcome: Not Progressing Goal: Ability to demonstrate self-control will improve Outcome: Not Progressing   Problem: Health Behavior/Discharge Planning: Goal: Identification of resources available to assist in meeting health care needs will improve Outcome: Not Progressing Goal: Compliance with treatment plan for underlying cause of condition will improve Outcome: Not Progressing   Problem: Safety: Goal: Periods of time without injury will increase Outcome: Not Progressing   Problem: Education: Goal: Ability to make informed decisions regarding treatment will improve Outcome: Not Progressing   Problem: Coping: Goal: Coping ability will improve Outcome: Not Progressing   Problem: Health Behavior/Discharge Planning: Goal: Identification of resources available to assist in meeting health care needs will improve Outcome: Not Progressing   Problem: Medication: Goal: Compliance with prescribed medication regimen will improve Outcome: Not Progressing   Problem: Self-Concept: Goal: Ability to disclose and discuss suicidal ideas  will improve Outcome: Not Progressing Goal: Will verbalize positive feelings about self Outcome: Not Progressing

## 2017-07-25 NOTE — Progress Notes (Signed)
Recreation Therapy Notes  Date: 07/25/2017  Time: 9:30 am   Location: Room 21   Behavioral response: N/A   Intervention Topic: Animal Assisted Therapy  Discussion/Intervention: Patient did not attend group.   Clinical Observations/Feedback:  Patient did not attend group.   Rawan Riendeau LRT/CTRS        Jeanie Mccard 07/25/2017 10:36 AM

## 2017-07-25 NOTE — Progress Notes (Signed)
Patient ID: [name], female   DOB: Aug 08, 1990, 27 y.o.   MRN: [id number] CSW attempted to meet with pt to conduct a clinical interview for completion of a psychosocial assessment;  Complete consents, and gather information about her support person who could be contacted to provide suicide prevention education.  CSW would also begin discharge planning with pt. CSW was unable to meet with pt today as pt shared that she did not wish to meet with anyone else this day.  CSW will attempt to meet with pt on Friday, July 26, 2017.

## 2017-07-25 NOTE — BHH Group Notes (Signed)
  07/25/2017  Time: 1PM  Type of Therapy/Topic:  Group Therapy:  Balance in Life  Participation Level:  Did Not Attend  Description of Group:   This group will address the concept of balance and how it feels and looks when one is unbalanced. Patients will be encouraged to process areas in their lives that are out of balance and identify reasons for remaining unbalanced. Facilitators will guide patients in utilizing problem-solving interventions to address and correct the stressor making their life unbalanced. Understanding and applying boundaries will be explored and addressed for obtaining and maintaining a balanced life. Patients will be encouraged to explore ways to assertively make their unbalanced needs known to significant others in their lives, using other group members and facilitator for support and feedback.  Therapeutic Goals: 1. Patient will identify two or more emotions or situations they have that consume much of in their lives. 2. Patient will identify signs/triggers that life has become out of balance:  3. Patient will identify two ways to set boundaries in order to achieve balance in their lives:  4. Patient will demonstrate ability to communicate their needs through discussion and/or role plays  Summary of Patient Progress: Pt was invited to attend group but chose not to attend. CSW will continue to encourage pt to attend group throughout their admission.       Therapeutic Modalities:   Cognitive Behavioral Therapy Solution-Focused Therapy Assertiveness Training  Alden Hipp, MSW, LCSW Clinical Social Worker 07/25/2017 1:48 PM

## 2017-07-25 NOTE — Progress Notes (Signed)
[facility] MD Progress Note  07/26/2017 3:24 PM [name]  MRN:  [id number]  Subjective:    [name] is very paranoid and not cooperative today. She is secluded in her room. She hardly answers my questions and does not look me in the eye. She took medications and reportsd no side effects. She is still very paranoid about her in-laws brelieving that the are trying to make her homelsss. She also thinks that they try to make her pregnant again to snatch another child. Actually, [name], the mother in law, cares for patient's 70 month old daughter.   Spoke with [name], who is supportive, sends love, and understands that the patient is not well.   Principal Problem: Schizoaffective disorder, bipolar type ([place]) Diagnosis:   Patient Active Problem List   Diagnosis Date Noted  . Schizoaffective disorder, bipolar type ([place]) [F25.0] 03/10/2014    Priority: High  . Drug overdose [T50.901A] 07/22/2017  . Overdose [T50.901A] 07/22/2017  . Intentional acetaminophen overdose ([place]) [T39.1X2A]   . Metabolic acidosis [X91.4]   . Pancreatitis, acute [K85.90] 06/30/2017  . Acute pancreatitis [K85.90] 06/30/2017  . DUB (dysfunctional uterine bleeding) [N93.8] 11/22/2016  . Hyperprolactinemia ([place]) [E22.1] 08/20/2016  . Contraceptive management [Z30.9] 08/09/2016  . Tachycardia with heart rate 121-140 beats per minute [R00.0] 01/13/2016  . 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 [Z14.8] 09/08/2015  . Seizure disorder ([place]) [N82.956] 08/08/2015  . Chronic migraine [G43.709] 07/27/2015  . Asthma [J45.909] 04/15/2015  . Borderline personality disorder ([place]) [F60.3] 03/10/2014  . PTSD (post-traumatic stress disorder) [F43.10] 03/10/2014  . Autism spectrum disorder [F84.0] 06/15/2013   Total Time spent with patient: 20 minutes  Past Psychiatric History: schizoaffective disorder  Past Medical History:  Past Medical History:  Diagnosis Date  . Acid reflux   . Asthma    last attack 03/13/15 or 03/14/15  . Autism   .  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 Chronic constipation   . Depression   . Drug-seeking behavior   . Essential tremor   . Headache   . Personality disorder ([place])   . Schizo-affective psychosis ([place])   . Schizoaffective disorder, bipolar type ([place])   . Seizures [facility])    Last seizure December 2017    Past Surgical History:  Procedure Laterality Date  . MOUTH SURGERY  2009 or 2010   Family History:  Family History  Problem Relation Age of Onset  . Mental illness Father   . Asthma Father   . PDD Brother   . Seizures Brother    Family Psychiatric  History: none Social History:  Social History   Substance and Sexual Activity  Alcohol Use No  . Alcohol/week: 0.6 oz  . Types: 1 Standard drinks or equivalent per week     Social History   Substance and Sexual Activity  Drug Use No   Comment: History of cocaine use at age 2 for 4 months    Social History   Socioeconomic History  . Marital status: Married    Spouse name: Not on file  . Number of children: 0  . Years of education: Not on file  . Highest education level: Not on file  Occupational History  . Occupation: disability  Social Needs  . Financial resource strain: Not on file  . Food insecurity:    Worry: Not on file    Inability: Not on file  . Transportation needs:    Medical: Not on file    Non-medical: Not on file  Tobacco  Use  . Smoking status: Former Smoker    Packs/day: 0.00  . Smokeless tobacco: Never Used  . Tobacco comment: Smoked for 2  years age [age]  Substance and Sexual Activity  . Alcohol use: No    Alcohol/week: 0.6 oz    Types: 1 Standard drinks or equivalent per week  . Drug use: No    Comment: History of cocaine use at age 89 for 4 months  . Sexual activity: Not Currently    Birth control/protection: None  Lifestyle  . Physical activity:    Days per week: Not on file    Minutes per session: Not on file  . Stress: Not on file  Relationships  . Social connections:    Talks on  phone: Not on file    Gets together: Not on file    Attends religious service: Not on file    Active member of club or organization: Not on file    Attends meetings of clubs or organizations: Not on file    Relationship status: Not on file  Other Topics Concern  . Not on file  Social History Narrative   Marital status: married; guardian is Furniture conservator/restorer      Children: none      Lives: with husband in apartment      Employment:  Disability      Tobacco: quit smoking; smoked for two years.      Alcohol ;none      Drugs: none   Has not traveled outside of the country.        Additional Social History:    Pain Medications: none Prescriptions: See PTA Over the Counter: See PTA History of alcohol / drug use?: Yes Longest period of sobriety (when/how long): 6 years Negative Consequences of Use: (none) Withdrawal Symptoms: (none)                    Sleep: Fair  Appetite:  Fair  Current Medications: Current Facility-Administered Medications  Medication Dose Route Frequency Provider Last Rate Last Dose  . acetaminophen (TYLENOL) tablet 650 mg  650 mg Oral Q6H PRN Jenet Durio B, MD      . alum & mag hydroxide-simeth (MAALOX/MYLANTA) 200-200-20 MG/5ML suspension 30 mL  30 mL Oral Q4H PRN Odessa Nishi B, MD      . feeding supplement (ENSURE ENLIVE) (ENSURE ENLIVE) liquid 237 mL  237 mL Oral BID BM Dane Kopke B, MD   237 mL at 07/25/17 1707  . hydrOXYzine (ATARAX/VISTARIL) tablet 50 mg  50 mg Oral TID PRN Clancey Welton B, MD   50 mg at 07/26/17 0718  . levETIRAcetam (KEPPRA) tablet 500 mg  500 mg Oral BID Aquila Menzie B, MD   500 mg at 07/26/17 0806  . magnesium hydroxide (MILK OF MAGNESIA) suspension 30 mL  30 mL Oral Daily PRN Rosan Calbert B, MD      . multivitamin with minerals tablet 1 tablet  1 tablet Oral Daily Theophilus Walz B, MD   1 tablet at 07/26/17 0806  . prazosin (MINIPRESS) capsule 2 mg  2 mg Oral BID Tiaja Hagan,  Kahla Risdon B, MD   2 mg at 07/26/17 0806  . risperiDONE (RISPERDAL) tablet 2 mg  2 mg Oral TID Konnor Vondrasek B, MD   2 mg at 07/26/17 1248  . sertraline (ZOLOFT) tablet 150 mg  150 mg Oral Daily Keitha Kolk B, MD   150 mg at 07/26/17 0806  . topiramate (TOPAMAX) tablet 100 mg  100 mg Oral QHS Lanee Chain B, MD   100 mg at 07/25/17 2006    Lab Results:  Results for orders placed or performed during the hospital encounter of 07/24/17 (from the past 48 hour(s))  Lipid panel     Status: Abnormal   Collection Time: 07/25/17  6:58 AM  Result Value Ref Range   Cholesterol 178 0 - 200 mg/dL   Triglycerides 99 <150 mg/dL   HDL 51 >40 mg/dL   Total CHOL/HDL Ratio 3.5 RATIO   VLDL 20 0 - 40 mg/dL   LDL Cholesterol 107 (H) 0 - 99 mg/dL    Comment:        Total Cholesterol/HDL:CHD Risk Coronary Heart Disease Risk Table                     Men   Women  1/2 Average Risk   3.4   3.3  Average Risk       5.0   4.4  2 X Average Risk   9.6   7.1  3 X Average Risk  23.4   11.0        Use the calculated Patient Ratio above and the CHD Risk Table to determine the patient's CHD Risk.        ATP III CLASSIFICATION (LDL):  <100     mg/dL   Optimal  100-129  mg/dL   Near or Above                    Optimal  130-159  mg/dL   Borderline  160-189  mg/dL   High  >190     mg/dL   Very High Performed at [facility], [place], [place] [address]   Hemoglobin A1c     Status: None   Collection Time: 07/25/17  6:58 AM  Result Value Ref Range   Hgb A1c MFr Bld 4.9 4.8 - 5.6 %    Comment: (NOTE) Pre diabetes:          5.7%-6.4% Diabetes:              >6.4% Glycemic control for   <7.0% adults with diabetes    Mean Plasma Glucose 93.93 mg/dL    Comment: Performed at [facility] [address] [place], [place] [address]  TSH     Status: None   Collection Time: 07/25/17  6:58 AM  Result Value Ref Range   TSH 2.100 0.350 - 4.500 uIU/mL    Comment:  Performed by a 3rd Generation assay with a functional sensitivity of <=0.01 uIU/mL. Performed at [facility], [address] [place],  [address]     Blood Alcohol level:  Lab Results  Component Value Date   [facility] <10 07/21/2017   ETH <5 32/44/0102    Metabolic Disorder Labs: Lab Results  Component Value Date   HGBA1C 4.9 07/25/2017   MPG 93.93 07/25/2017   Lab Results  Component Value Date   PROLACTIN 194.7 (H) 11/22/2016   PROLACTIN 146.3 (H) 08/09/2016   Lab Results  Component Value Date   CHOL 178 07/25/2017   TRIG 99 07/25/2017   HDL 51 07/25/2017   CHOLHDL 3.5 07/25/2017   VLDL 20 07/25/2017   LDLCALC 107 (H) 07/25/2017   LDLCALC 99 07/16/2015    Physical Findings: AIMS:  , ,  ,  ,    CIWA:    COWS:     Musculoskeletal: Strength &  Muscle Tone: within normal limits Gait & Station: normal Patient leans: N/A  Psychiatric Specialty Exam: Physical Exam  Nursing note and vitals reviewed. Psychiatric: Her affect is inappropriate. Her speech is rapid and/or pressured and tangential. She is withdrawn. Thought content is paranoid and delusional. Cognition and memory are impaired. She expresses impulsivity.    Review of Systems  Neurological: Positive for headaches.  Psychiatric/Behavioral: Positive for hallucinations.  All other systems reviewed and are negative.   Blood pressure 119/73, pulse (!) 109, temperature 98 F (36.7 C), temperature source Oral, resp. rate 18, height 5\' 4"  (1.626 m), weight 77.1 kg (170 lb), SpO2 100 %, not currently breastfeeding.Body mass index is 29.18 kg/m.  General Appearance: Casual  Eye Contact:  Poor  Speech:  Clear and Coherent and Pressured  Volume:  Increased  Mood:  Angry, Dysphoric and Irritable  Affect:  Inappropriate  Thought Process:  Disorganized, Irrelevant, Linear and Descriptions of Associations: Tangential  Orientation:  Full (Time, Place, and Person)  Thought Content:  Delusions and Paranoid  Ideation  Suicidal Thoughts:  No  Homicidal Thoughts:  No  Memory:  Immediate;   Fair Recent;   Fair Remote;   Fair  Judgement:  Poor  Insight:  Lacking  Psychomotor Activity:  Increased  Concentration:  Concentration: Fair and Attention Span: Fair  Recall:  [employer] of Knowledge:  Fair  Language:  Fair  Akathisia:  No  Handed:  Right  AIMS (if indicated):     Assets:  Communication Skills Desire for Improvement Financial Resources/Insurance Housing Physical Health Resilience Social Support  ADL's:  Intact  Cognition:  WNL  Sleep:  Number of Hours: 4.75     Treatment Plan Summary: Daily contact with patient to assess and evaluate symptoms and progress in treatment and Medication management   Ms. Strausser is a 27 year old female with a history of schizoaffective disorder transfer from medical floor where she was hospitalized after suicide attempt by overdose on multiple medications including Tylenol. She is paranoid, delusional and disorganized.  #Psychosis/mood -continue Risperdal 2 mg TID, per patient wishes -continue Zoloft 150 mg daily  #Insomnia, slept again 4 hours only -start Restoril 15 mg nightly  #PTSD -continue Minipress 2 mg BID  #Seizure disorder -continue Keppra 500 mg BID  #Migraine headache prevention -continue Topamax 100 mg nightly  #Hyperprolactinaemia -head CT scan when patient stable -PRL  #Weight loss -Ensure TID  #Labs -lipid panel, TSH, A1c are normal -EKG reviewed, NSR with QTc 469 -pregnancy test negative  #Disposition -discharge to her apartment -follow up with [facility] ACT team     Orson Slick, MD 07/26/2017, 3:24 PM

## 2017-07-25 NOTE — BHH Suicide Risk Assessment (Signed)
[facility] Admission Suicide Risk Assessment   Nursing information obtained from:  Patient Demographic factors:  Adolescent or young adult, Caucasian, Living alone, Unemployed Current Mental Status:  Self-harm behaviors Loss Factors:  Loss of significant relationship, Legal issues Historical Factors:  Prior suicide attempts, Impulsivity Risk Reduction Factors:  NA  Total Time spent with patient: 1 hour Principal Problem: Schizoaffective disorder, bipolar type ([place]) Diagnosis:   Patient Active Problem List   Diagnosis Date Noted  . Schizoaffective disorder, bipolar type ([place]) [F25.0] 03/10/2014    Priority: High  . Drug overdose [T50.901A] 07/22/2017  . Overdose [T50.901A] 07/22/2017  . Intentional acetaminophen overdose ([place]) [T39.1X2A]   . Metabolic acidosis [C14.4]   . Pancreatitis, acute [K85.90] 06/30/2017  . Acute pancreatitis [K85.90] 06/30/2017  . DUB (dysfunctional uterine bleeding) [N93.8] 11/22/2016  . Hyperprolactinemia ([place]) [E22.1] 08/20/2016  . Contraceptive management [Z30.9] 08/09/2016  . Tachycardia with heart rate 121-140 beats per minute [R00.0] 01/13/2016  . 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 [Z14.8] 09/08/2015  . Seizure disorder ([place]) [Y18.563] 08/08/2015  . Chronic migraine [G43.709] 07/27/2015  . Asthma [J45.909] 04/15/2015  . Borderline personality disorder ([place]) [F60.3] 03/10/2014  . PTSD (post-traumatic stress disorder) [F43.10] 03/10/2014  . Autism spectrum disorder [F84.0] 06/15/2013   Subjective Data: overdose, paranoia  Continued Clinical Symptoms:  Alcohol Use Disorder Identification Test Final Score (AUDIT): 0 The "Alcohol Use Disorders Identification Test", Guidelines for Use in Primary Care, Second Edition.  World Pharmacologist [facility]). Score between 0-7:  no or low risk or alcohol related problems. Score between 8-15:  moderate risk of alcohol related problems. Score between 16-19:  high risk of alcohol related problems. Score 20 or above:   warrants further diagnostic evaluation for alcohol dependence and treatment.   CLINICAL FACTORS:   Severe Anxiety and/or Agitation Bipolar Disorder:   Mixed State Currently Psychotic Previous Psychiatric Diagnoses and Treatments   Musculoskeletal: Strength & Muscle Tone: within normal limits Gait & Station: normal Patient leans: N/A  Psychiatric Specialty Exam: Physical Exam  Nursing note and vitals reviewed. Psychiatric: Her affect is blunt and inappropriate. Her speech is rapid and/or pressured. She is withdrawn. Thought content is paranoid and delusional. Cognition and memory are normal. She expresses impulsivity.    Review of Systems  Neurological: Positive for headaches.  Psychiatric/Behavioral: Positive for depression and hallucinations. The patient is nervous/anxious.   All other systems reviewed and are negative.   Blood pressure 121/76, pulse (!) 124, temperature 98 F (36.7 C), temperature source Oral, resp. rate 18, height 5\' 4"  (1.626 m), weight 77.1 kg (170 lb), SpO2 100 %, not currently breastfeeding.Body mass index is 29.18 kg/m.  General Appearance: Casual  Eye Contact:  Poor  Speech:  Clear and Coherent and Pressured  Volume:  Increased  Mood:  Anxious and Irritable  Affect:  Congruent  Thought Process:  Disorganized and Descriptions of Associations: Tangential  Orientation:  Full (Time, Place, and Person)  Thought Content:  Delusions and Paranoid Ideation  Suicidal Thoughts:  No  Homicidal Thoughts:  No  Memory:  Immediate;   Fair Recent;   Fair Remote;   Fair  Judgement:  Poor  Insight:  Lacking  Psychomotor Activity:  Increased  Concentration:  Concentration: Fair and Attention Span: Fair  Recall:  [employer] of Knowledge:  Fair  Language:  Fair  Akathisia:  No  Handed:  Right  AIMS (if indicated):     Assets:  Communication Skills Desire for Improvement Financial Resources/Insurance Intimacy Physical Health Resilience Social Support  ADL's:  Intact  Cognition:  WNL  Sleep:  Number of Hours: 4.5      COGNITIVE FEATURES THAT CONTRIBUTE TO RISK:  None    SUICIDE RISK:   Moderate:  Frequent suicidal ideation with limited intensity, and duration, some specificity in terms of plans, no associated intent, good self-control, limited dysphoria/symptomatology, some risk factors present, and identifiable protective factors, including available and accessible social support.  PLAN OF CARE: hospital admission, medication management, discharge planning.  Ms. Coatney is a 27 year old female with a history of schizoaffective disorder transfer from medical floor where she was hospitalized after suicide attempt by overdose on multiple medications including Tylenol. She is paranoid, delusional and disorganized.  #Psychosis/mood -restart Risperdal 2 mg TID, per patient wishes -restart Zoloft and increase to 150 mg daily  #PTSD -start Minipress 2 mg BID  #Seizure disorder -restart Keppra 500 mg BID  #Migraine headache prevention -restart Topamax 100 mg nightly  #Hyperprolactinaemia -head CT scan -PRL  #Labs -lipid panel, TSH, A1c -EKG -pregnancy test  #Disposition -discharge to her apartment -follow up with [facility] ACT team   I certify that inpatient services furnished can reasonably be expected to improve the patient's condition.   Orson Slick, MD 07/25/2017, 11:20 AM

## 2017-07-26 MED ORDER — TEMAZEPAM 15 MG PO CAPS
15.0000 mg | ORAL_CAPSULE | Freq: Every day | ORAL | Status: DC
  Administered 2017-07-26 – 2017-07-28 (×3): 15 mg via ORAL
  Filled 2017-07-26 (×3): qty 1

## 2017-07-26 NOTE — Plan of Care (Addendum)
Patient found awake in bed mumbling to herself in bed. Patient is neither visible nor social this evening. Patient continues to be resistant to care. Patient attention-seeking by standing next to the nursing station with her face against the window. When attempting to speak with her, her answers are incoherent and illogical. Patient has persecutory and paranoid thinking. She believes she is being brought here in order for her to get pregnant in order to make another baby for her in-laws "to steal." When providing reality orientation, patient is completely resistant and tearful. Patient refused her minipress and Topamax @1700  but took it @ HS with other medications. Patient played a childlike game saying she would take them and then changing her mind and then finally taking them. Received medications early as I took the opportunity when it arrived @2000 . Patient is secretive and paranoid. While speaking, she told me that she could not even talk because "I don't trust it not to come into my mouth. I won't talk because it will come into my mouth." When asked what, patient states, "The bad stuff." Patient speech is soft and rapid and difficult to understand. Denies SI/HI/AVH. Denies thoughts of self-harm. Reports not eating all day, "because I could swallow it." Patient reports showering and washing her hair, putting on deodorant but has gauze from blood draws at the other facility on her forearms that has not been wet, she is malodorous, her hair greasy. Attempted unsuccessfully to get her to shower. Patient processing is impaired. Compliant with HS medications. Q 15 minute checks maintained. Will continue to monitor throughout the shift. Patient slept 4.75 hours. Restlessly. Patient must be prompted numerous times to comply with am vitals. Patient BP and pulse are elevated. Complains of nausea and anxiety. Offered Vistaril but patient did not want to take it without something for nausea. Spoke with MD. Patient then  said she was not nauseous only anxious. Given Vistaril and had conversation abut focusing on herself. Patient seemed to be calmed by this.  Will endorse to oncoming shift.  Problem: Education: Goal: Knowledge of [facility] Education information/materials will improve Outcome: Not Progressing Goal: Emotional status will improve Outcome: Not Progressing Goal: Mental status will improve Outcome: Not Progressing Goal: Verbalization of understanding the information provided will improve Outcome: Not Progressing   Problem: Activity: Goal: Interest or engagement in activities will improve Outcome: Not Progressing   Problem: Coping: Goal: Ability to verbalize frustrations and anger appropriately will improve Outcome: Not Progressing Goal: Ability to demonstrate self-control will improve Outcome: Not Progressing   Problem: Education: Goal: Ability to make informed decisions regarding treatment will improve Outcome: Not Progressing   Problem: Medication: Goal: Compliance with prescribed medication regimen will improve Outcome: Not Progressing

## 2017-07-26 NOTE — BHH Group Notes (Signed)
07/26/2017 1PM  Type of Therapy and Topic:  Group Therapy:  Feelings around Relapse and Recovery  Participation Level:  Did Not Attend   Description of Group:    Patients in this group will discuss emotions they experience before and after a relapse. They will process how experiencing these feelings, or avoidance of experiencing them, relates to having a relapse. Facilitator will guide patients to explore emotions they have related to recovery. Patients will be encouraged to process which emotions are more powerful. They will be guided to discuss the emotional reaction significant others in their lives may have to patients' relapse or recovery. Patients will be assisted in exploring ways to respond to the emotions of others without this contributing to a relapse.  Therapeutic Goals: 1. Patient will identify two or more emotions that lead to a relapse for them 2. Patient will identify two emotions that result when they relapse 3. Patient will identify two emotions related to recovery 4. Patient will demonstrate ability to communicate their needs through discussion and/or role plays   Summary of Patient Progress: Patient was encouraged and invited to attend group. Patient did not attend group. Social worker will continue to encourage group participation in the future.      Therapeutic Modalities:   Cognitive Behavioral Therapy Solution-Focused Therapy Assertiveness Training Relapse Prevention Therapy   Darin Engels, [place] 07/26/2017 1:46 PM

## 2017-07-26 NOTE — Tx Team (Addendum)
Interdisciplinary Treatment and Diagnostic Plan Update  07/26/2017 Time of Session: 11:05 AM [name] MRN: [id number]  Principal Diagnosis: Schizoaffective disorder, bipolar type ([place])  Secondary Diagnoses: Principal Problem:   Schizoaffective disorder, bipolar type ([place]) Active Problems:   Autism spectrum disorder   PTSD (post-traumatic stress disorder)   Seizure disorder (HCC)   Hyperprolactinemia (HCC)   Overdose   Current Medications:  Current Facility-Administered Medications  Medication Dose Route Frequency Provider Last Rate Last Dose  . acetaminophen (TYLENOL) tablet 650 mg  650 mg Oral Q6H PRN Pucilowska, Jolanta B, MD      . alum & mag hydroxide-simeth (MAALOX/MYLANTA) 200-200-20 MG/5ML suspension 30 mL  30 mL Oral Q4H PRN Pucilowska, Jolanta B, MD      . feeding supplement (ENSURE ENLIVE) (ENSURE ENLIVE) liquid 237 mL  237 mL Oral BID BM Pucilowska, Jolanta B, MD   237 mL at 07/25/17 1707  . hydrOXYzine (ATARAX/VISTARIL) tablet 50 mg  50 mg Oral TID PRN Pucilowska, Jolanta B, MD   50 mg at 07/26/17 0718  . levETIRAcetam (KEPPRA) tablet 500 mg  500 mg Oral BID Pucilowska, Jolanta B, MD   500 mg at 07/26/17 0806  . magnesium hydroxide (MILK OF MAGNESIA) suspension 30 mL  30 mL Oral Daily PRN Pucilowska, Jolanta B, MD      . multivitamin with minerals tablet 1 tablet  1 tablet Oral Daily Pucilowska, Jolanta B, MD   1 tablet at 07/26/17 0806  . prazosin (MINIPRESS) capsule 2 mg  2 mg Oral BID Pucilowska, Jolanta B, MD   2 mg at 07/26/17 0806  . risperiDONE (RISPERDAL) tablet 2 mg  2 mg Oral TID Pucilowska, Jolanta B, MD   2 mg at 07/26/17 1248  . sertraline (ZOLOFT) tablet 150 mg  150 mg Oral Daily Pucilowska, Jolanta B, MD   150 mg at 07/26/17 0806  . topiramate (TOPAMAX) tablet 100 mg  100 mg Oral QHS Pucilowska, Jolanta B, MD   100 mg at 07/25/17 2006   PTA Medications: Medications Prior to Admission  Medication Sig Dispense Refill Last Dose  . albuterol (PROVENTIL  HFA;VENTOLIN HFA) 108 (90 Base) MCG/ACT inhaler Inhale 1-2 puffs into the lungs every 6 (six) hours as needed for wheezing or shortness of breath.   07/24/2017 at Unknown time  . fluticasone-salmeterol (ADVAIR HFA) 115-21 MCG/ACT inhaler Inhale 2 puffs into the lungs 2 (two) times daily. 1 Inhaler 5 07/24/2017 at Unknown time  . levETIRAcetam (KEPPRA) 500 MG tablet TAKE 1 TABLET BY MOUTH TWICE A DAY 60 tablet 1 07/24/2017 at Unknown time  . magnesium gluconate (MAGONATE) 500 MG tablet Take 500 mg by mouth daily.   07/24/2017 at Unknown time  . Multiple Vitamin (MULTIVITAMIN) tablet Take 1 tablet by mouth daily.   07/24/2017 at Unknown time  . levonorgestrel-ethinyl estradiol (JOLESSA) 0.15-0.03 MG tablet Take 1 tablet by mouth daily. (Patient not taking: Reported on 07/25/2017) 1 Package 4 Not Taking at Unknown time    Patient Stressors: Loss of husband  Marital or family conflict  Patient Strengths: Capable of independent living Child psychotherapist Supportive family/friends  Treatment Modalities: Medication Management, Group therapy, Case management,  1 to 1 session with clinician, Psychoeducation, Recreational therapy.   Physician Treatment Plan for Primary Diagnosis: Schizoaffective disorder, bipolar type ([place]) Long Term Goal(s): Improvement in symptoms so as ready for discharge NA   Short Term Goals: Ability to identify changes in lifestyle to reduce recurrence of condition will improve Ability to verbalize feelings will  improve Ability to disclose and discuss suicidal ideas Ability to demonstrate self-control will improve Ability to identify and develop effective coping behaviors will improve Ability to maintain clinical measurements within normal limits will improve Compliance with prescribed medications will improve Ability to identify triggers associated with substance abuse/mental health issues will improve NA  Medication Management: Evaluate patient's response,  side effects, and tolerance of medication regimen.  Therapeutic Interventions: 1 to 1 sessions, Unit Group sessions and Medication administration.  Evaluation of Outcomes: Not Progressing  Physician Treatment Plan for Secondary Diagnosis: Principal Problem:   Schizoaffective disorder, bipolar type ([place]) Active Problems:   Autism spectrum disorder   PTSD (post-traumatic stress disorder)   Seizure disorder (HCC)   Hyperprolactinemia (HCC)   Overdose  Long Term Goal(s): Improvement in symptoms so as ready for discharge NA   Short Term Goals: Ability to identify changes in lifestyle to reduce recurrence of condition will improve Ability to verbalize feelings will improve Ability to disclose and discuss suicidal ideas Ability to demonstrate self-control will improve Ability to identify and develop effective coping behaviors will improve Ability to maintain clinical measurements within normal limits will improve Compliance with prescribed medications will improve Ability to identify triggers associated with substance abuse/mental health issues will improve NA     Medication Management: Evaluate patient's response, side effects, and tolerance of medication regimen.  Therapeutic Interventions: 1 to 1 sessions, Unit Group sessions and Medication administration.  Evaluation of Outcomes: Not Progressing   RN Treatment Plan for Primary Diagnosis: Schizoaffective disorder, bipolar type ([place]) Long Term Goal(s): Knowledge of disease and therapeutic regimen to maintain health will improve  Short Term Goals: Ability to demonstrate self-control, Ability to participate in decision making will improve, Ability to identify and develop effective coping behaviors will improve and Compliance with prescribed medications will improve  Medication Management: RN will administer medications as ordered by provider, will assess and evaluate patient's response and provide education to patient for prescribed  medication. RN will report any adverse and/or side effects to prescribing provider.  Therapeutic Interventions: 1 on 1 counseling sessions, Psychoeducation, Medication administration, Evaluate responses to treatment, Monitor vital signs and CBGs as ordered, Perform/monitor CIWA, COWS, AIMS and Fall Risk screenings as ordered, Perform wound care treatments as ordered.  Evaluation of Outcomes: Not Progressing   LCSW Treatment Plan for Primary Diagnosis: Schizoaffective disorder, bipolar type ([place]) Long Term Goal(s): Safe transition to appropriate next level of care at discharge, Engage patient in therapeutic group addressing interpersonal concerns.  Short Term Goals: Engage patient in aftercare planning with referrals and resources and Increase skills for wellness and recovery  Therapeutic Interventions: Assess for all discharge needs, 1 to 1 time with Social worker, Explore available resources and support systems, Assess for adequacy in community support network, Educate family and significant other(s) on suicide prevention, Complete Psychosocial Assessment, Interpersonal group therapy.  Evaluation of Outcomes: Not Progressing   Progress in Treatment: Attending groups: No. Participating in groups: No. Taking medication as prescribed: No. Toleration medication: Yes. Family/Significant other contact made: No, will contact:  CSW will contact identified support if given consent by pt. Patient understands diagnosis: Yes. Discussing patient identified problems/goals with staff: No. Medical problems stabilized or resolved: Yes. Denies suicidal/homicidal ideation: Yes. Issues/concerns per patient self-inventory: No. Other: n/a  New problem(s) identified: No, Describe:  No new problems identified  New Short Term/Long Term Goal(s):  Patient Goals:  Pt refused to attend treatment team meeting  Discharge Plan or Barriers: Tentative discharge plan is for pt to  return to her apartment and resume  services with her [facility]).  Reason for Continuation of Hospitalization: Anxiety Depression Medication stabilization  Estimated Length of Stay: 3-5 days  Recreational Therapy: Patient Stressors: N/A Patient Goal: Patient will engage in groups without prompting or encouragement from LRT x3 group sessions within 5 recreation therapy group sessions  Attendees: Patient: 07/26/2017 1:44 PM  Physician: Orson Slick, MD 07/26/2017 1:44 PM  Nursing: Elige Radon, RN 07/26/2017 1:44 PM  RN Care Manager: 07/26/2017 1:44 PM  Social Worker: Derrek Gu, LCSW 07/26/2017 1:44 PM  Recreational Therapist: Roanna Epley, LRT 07/26/2017 1:44 PM  Other: Darin Engels, [place] 07/26/2017 1:44 PM  Other: Alden Hipp, LCSW 07/26/2017 1:44 PM  Other: Marney Doctor, Chaplain 07/26/2017 1:44 PM    Scribe for Treatment Team: Devona Konig, LCSW 07/26/2017 1:44 PM

## 2017-07-26 NOTE — Plan of Care (Signed)
This morning patient used call bell states "I cannot breath I am in pain,my throat is pulling."Patient rated her pain 4/10.VS stable,no signs of distress noted.Patient stayed in bed all day.Speech is disorganized and unclear.Denies SI,HI and AVH.Compliant with medications.Encouraged for shower and washing dirty cloths.Support and encouragement given.
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
## 2017-07-26 NOTE — Progress Notes (Signed)
Recreation Therapy Notes  INPATIENT RECREATION THERAPY ASSESSMENT  Patient Details Name: RENI HAUSNER MRN: [id number] DOB: February 29, 1990 Today's Date: 07/26/2017   Patient refused assessment.       Information Obtained From:    Able to Participate in Assessment/Interview:    Patient Presentation:    Reason for Admission (Per Patient):    Patient Stressors:    Coping Skills:      Leisure Interests (2+):     Frequency of Recreation/Participation:    Awareness of Community Resources:     [employer]:     Current Use:    If no, Barriers?:    Expressed Interest in [place] of Residence:     Patient Main Form of Transportation:    Patient Strengths:     Patient Identified Areas of Improvement:     Patient Goal for Hospitalization:     Current SI (including self-harm):     Current HI:     Current AVH:    Staff Intervention Plan:    Consent to Intern Participation:    Tamre Cass 07/26/2017, 2:36 PM

## 2017-07-26 NOTE — BHH Group Notes (Signed)
[place] Group Notes:  (Nursing/MHT/Case Management/Adjunct)  Date:  07/26/2017  Time:  9:17 PM  Type of Therapy:  Group Therapy  Participation Level:  Did Not Attend    Barnie Mort 07/26/2017, 9:17 PM

## 2017-07-26 NOTE — Progress Notes (Signed)
Recreation Therapy Notes  Date: 07/26/2017  Time: 9:30 am   Location: Craft room   Behavioral response: N/A   Intervention Topic: Problem Solving  Discussion/Intervention: Patient did not attend group.   Clinical Observations/Feedback:  Patient did not attend group.   Khamille Beynon LRT/CTRS        Arran Fessel 07/26/2017 10:22 AM

## 2017-07-27 LAB — PROLACTIN: PROLACTIN: 129 ng/mL — AB (ref 4.8–23.3)

## 2017-07-27 NOTE — Plan of Care (Addendum)
Patient found in day room upon my arrival. Patient is visible and social this evening. Patient mood and affect are much improved over the last 48 hours. Patient is more organized and open to processing information. When she requests to speak with Probation officer, she is open to counseling and emotional support. Patient is making a concerted effort to practice coping skills and attend groups in a mindful way. Patient is open to staff suggestions regarding her anxiety and able to use advice in order to maintain control when she feels sad or anxious. Patient is focused on improving herself so she can remain in her daughter's life. Patient seems to be valuing and growing from her experience here. Denies SI/HI/AVH. Had one episode of tearfulness when speaking about her daughter but was able to profit from talking about it. Appetite has improved, patient ate large snack and is drinking more. Patient hygiene is improved. Speech is less tangential, more organized. Compliant with EKG, copy placed in chart. Compliant with HS medications and staff direction. Q 15 minute checks maintained. Will continue to monitor throughout the shift. Patient slept 7.5 hours. No apparent distress. Pulse elevated in am, 104 manually. Will endorse care to oncoming shift.  Problem: Education: Goal: Emotional status will improve Outcome: Progressing Goal: Mental status will improve Outcome: Progressing Goal: Verbalization of understanding the information provided will improve Outcome: Progressing   Problem: Activity: Goal: Interest or engagement in activities will improve Outcome: Progressing Goal: Sleeping patterns will improve Outcome: Progressing   Problem: Coping: Goal: Ability to verbalize frustrations and anger appropriately will improve Outcome: Progressing Goal: Ability to demonstrate self-control will improve Outcome: Progressing   Problem: Coping: Goal: Coping ability will improve Outcome: Progressing   Problem:  Medication: Goal: Compliance with prescribed medication regimen will improve Outcome: Progressing

## 2017-07-27 NOTE — Plan of Care (Signed)
Patient alert and oriented to self, and place . Patient is disoriented about situation. Patient complains of nervousness and continues to be paranoid. Patient's pulse is tachy. Patient has not been eating or drinking last couple of days, however this morning did eat breakfast and stated she would eat and drink today.Patient did take morning medications. Patient has crying spells when asked questions, not able to express thoughts to nurse without crying. Patient denies SI, HI and AVH. Nurse will continue to monitor and encourage foods and group therapy. Problem: Elimination: Goal: Will not experience complications related to bowel motility Outcome: Progressing   Problem: Education: Goal: Knowledge of [facility] Education information/materials will improve Outcome: Progressing Goal: Emotional status will improve Outcome: Progressing Goal: Mental status will improve Outcome: Progressing Goal: Verbalization of understanding the information provided will improve Outcome: Progressing   Problem: Activity: Goal: Interest or engagement in activities will improve Outcome: Progressing Goal: Sleeping patterns will improve Outcome: Progressing

## 2017-07-27 NOTE — BHH Group Notes (Signed)
[place] Group Notes:  (Nursing/MHT/Case Management/Adjunct)  Date:  07/27/2017  Time:  10:01 PM  Type of Therapy:  Group Therapy  Participation Level:  Active  Participation Quality:  Appropriate and Sharing  Affect:  Appropriate  Cognitive:  Appropriate  Insight:  Appropriate  Engagement in Group:  Engaged  Modes of Intervention:  Discussion  Summary of Progress/Problems: Group reviewed rules of unit and expectations. Group made introductions and discussed goal for the day. Group addressed barriers that hindered progress of goals. Group discussed coping skills learned while on the unit and how group could utilize when returning to natural setting. [place] stated she remained positive today, even though her family did not want to talk to her.  Stated she no longer had SI and realized her life was valuable. Stated she accomplished her goal to stay positive. Barnie Mort 07/27/2017, 10:01 PM

## 2017-07-27 NOTE — Progress Notes (Signed)
[facility] MD Progress Note  07/27/2017 1:24 PM [name]  MRN:  [id number] Subjective: "Okay" patient seen chart reviewed.  27 year old woman with a history of chronic psychotic disorder diagnosis of schizoaffective disorder.  Multiple medical problems.  Patient remains withdrawn.  Stays in her bed most of the time.  Does not eat or drink very much.  Not acting out or aggressive and she has been compliant apparently with her medicine but still vaguely paranoid.  Denies suicidal thoughts.  Noted to have persistent tachycardia but EKG just shows a normal sinus tachycardia and the patient is asymptomatic. Principal Problem: Schizoaffective disorder, bipolar type ([place]) Diagnosis:   Patient Active Problem List   Diagnosis Date Noted  . Drug overdose [T50.901A] 07/22/2017  . Overdose [T50.901A] 07/22/2017  . Intentional acetaminophen overdose ([place]) [T39.1X2A]   . Metabolic acidosis [R41.6]   . Pancreatitis, acute [K85.90] 06/30/2017  . Acute pancreatitis [K85.90] 06/30/2017  . DUB (dysfunctional uterine bleeding) [N93.8] 11/22/2016  . Hyperprolactinemia ([place]) [E22.1] 08/20/2016  . Contraceptive management [Z30.9] 08/09/2016  . Tachycardia [R00.0] 01/13/2016  . 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 [Z14.8] 09/08/2015  . Seizure disorder ([place]) [L84.536] 08/08/2015  . Chronic migraine [G43.709] 07/27/2015  . Asthma [J45.909] 04/15/2015  . Schizoaffective disorder, bipolar type ([place]) [F25.0] 03/10/2014  . Borderline personality disorder ([place]) [F60.3] 03/10/2014  . PTSD (post-traumatic stress disorder) [F43.10] 03/10/2014  . Autism spectrum disorder [F84.0] 06/15/2013   Total Time spent with patient: 30 minutes  Past Psychiatric History: Long history of psychotic disorder with noncompliance with medicine  Past Medical History:  Past Medical History:  Diagnosis Date  . Acid reflux   . Asthma    last attack 03/13/15 or 03/14/15  . Autism   . 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 Chronic constipation   .  Depression   . Drug-seeking behavior   . Essential tremor   . Headache   . Personality disorder ([place])   . Schizo-affective psychosis ([place])   . Schizoaffective disorder, bipolar type ([place])   . Seizures [facility])    Last seizure December 2017    Past Surgical History:  Procedure Laterality Date  . MOUTH SURGERY  2009 or 2010   Family History:  Family History  Problem Relation Age of Onset  . Mental illness Father   . Asthma Father   . PDD Brother   . Seizures Brother    Family Psychiatric  History: Positive for mental illness Social History:  Social History   Substance and Sexual Activity  Alcohol Use No  . Alcohol/week: 0.6 oz  . Types: 1 Standard drinks or equivalent per week     Social History   Substance and Sexual Activity  Drug Use No   Comment: History of cocaine use at age 3 for 4 months    Social History   Socioeconomic History  . Marital status: Married    Spouse name: Not on file  . Number of children: 0  . Years of education: Not on file  . Highest education level: Not on file  Occupational History  . Occupation: disability  Social Needs  . Financial resource strain: Not on file  . Food insecurity:    Worry: Not on file    Inability: Not on file  . Transportation needs:    Medical: Not on file    Non-medical: Not on file  Tobacco Use  . Smoking status: Former Smoker    Packs/day: 0.00  . Smokeless tobacco: Never Used  . Tobacco  comment: Smoked for 2  years age [age]  Substance and Sexual Activity  . Alcohol use: No    Alcohol/week: 0.6 oz    Types: 1 Standard drinks or equivalent per week  . Drug use: No    Comment: History of cocaine use at age 50 for 4 months  . Sexual activity: Not Currently    Birth control/protection: None  Lifestyle  . Physical activity:    Days per week: Not on file    Minutes per session: Not on file  . Stress: Not on file  Relationships  . Social connections:    Talks on phone: Not on file    Gets together:  Not on file    Attends religious service: Not on file    Active member of club or organization: Not on file    Attends meetings of clubs or organizations: Not on file    Relationship status: Not on file  Other Topics Concern  . Not on file  Social History Narrative   Marital status: married; guardian is Furniture conservator/restorer      Children: none      Lives: with husband in apartment      Employment:  Disability      Tobacco: quit smoking; smoked for two years.      Alcohol ;none      Drugs: none   Has not traveled outside of the country.        Additional Social History:    Pain Medications: none Prescriptions: See PTA Over the Counter: See PTA History of alcohol / drug use?: Yes Longest period of sobriety (when/how long): 6 years Negative Consequences of Use: (none) Withdrawal Symptoms: (none)                    Sleep: Fair  Appetite:  Fair  Current Medications: Current Facility-Administered Medications  Medication Dose Route Frequency Provider Last Rate Last Dose  . acetaminophen (TYLENOL) tablet 650 mg  650 mg Oral Q6H PRN Pucilowska, Jolanta B, MD      . alum & mag hydroxide-simeth (MAALOX/MYLANTA) 200-200-20 MG/5ML suspension 30 mL  30 mL Oral Q4H PRN Pucilowska, Jolanta B, MD      . feeding supplement (ENSURE ENLIVE) (ENSURE ENLIVE) liquid 237 mL  237 mL Oral BID BM Pucilowska, Jolanta B, MD   237 mL at 07/27/17 1103  . hydrOXYzine (ATARAX/VISTARIL) tablet 50 mg  50 mg Oral TID PRN Pucilowska, Jolanta B, MD   50 mg at 07/26/17 0718  . levETIRAcetam (KEPPRA) tablet 500 mg  500 mg Oral BID Pucilowska, Jolanta B, MD   500 mg at 07/27/17 0753  . magnesium hydroxide (MILK OF MAGNESIA) suspension 30 mL  30 mL Oral Daily PRN Pucilowska, Jolanta B, MD      . multivitamin with minerals tablet 1 tablet  1 tablet Oral Daily Pucilowska, Jolanta B, MD   1 tablet at 07/27/17 0753  . prazosin (MINIPRESS) capsule 2 mg  2 mg Oral BID Pucilowska, Jolanta B, MD   2 mg at 07/27/17 0753  .  risperiDONE (RISPERDAL) tablet 2 mg  2 mg Oral TID Pucilowska, Jolanta B, MD   2 mg at 07/27/17 1245  . sertraline (ZOLOFT) tablet 150 mg  150 mg Oral Daily Pucilowska, Jolanta B, MD   150 mg at 07/27/17 0753  . temazepam (RESTORIL) capsule 15 mg  15 mg Oral QHS Pucilowska, Jolanta B, MD   15 mg at 07/26/17 2144  . topiramate (TOPAMAX) tablet 100  mg  100 mg Oral QHS Pucilowska, Jolanta B, MD   100 mg at 07/26/17 2144    Lab Results:  Results for orders placed or performed during the hospital encounter of 07/24/17 (from the past 48 hour(s))  Prolactin     Status: Abnormal   Collection Time: 07/25/17  5:36 PM  Result Value Ref Range   Prolactin 129.0 (H) 4.8 - 23.3 ng/mL    Comment: (NOTE) Performed At: [facility] [place], [place] [id number] Rush Farmer MD [id number] Performed at [facility], [address] [place], [place] [address]     Blood Alcohol level:  Lab Results  Component Value Date   [facility] <10 07/21/2017   ETH <5 81/02/7508    Metabolic Disorder Labs: Lab Results  Component Value Date   HGBA1C 4.9 07/25/2017   MPG 93.93 07/25/2017   Lab Results  Component Value Date   PROLACTIN 129.0 (H) 07/25/2017   PROLACTIN 194.7 (H) 11/22/2016   Lab Results  Component Value Date   CHOL 178 07/25/2017   TRIG 99 07/25/2017   HDL 51 07/25/2017   CHOLHDL 3.5 07/25/2017   VLDL 20 07/25/2017   LDLCALC 107 (H) 07/25/2017   LDLCALC 99 07/16/2015    Physical Findings: AIMS:  , ,  ,  ,    CIWA:    COWS:     Musculoskeletal: Strength & Muscle Tone: within normal limits Gait & Station: normal Patient leans: N/A  Psychiatric Specialty Exam: Physical Exam  Nursing note and vitals reviewed. Constitutional: She appears well-developed and well-nourished.  HENT:  Head: Normocephalic and atraumatic.  Eyes: Pupils are equal, round, and reactive to light. Conjunctivae are normal.  Neck: Normal range of motion.  Cardiovascular: Normal  heart sounds.  Patient has persistent sinus tachycardia but with normal blood pressure and no complaints.  Respiratory: Effort normal.  GI: Soft.  Musculoskeletal: Normal range of motion.  Neurological: She is alert.  Skin: Skin is warm and dry.  Psychiatric: Her affect is blunt. Her speech is delayed. She is slowed and withdrawn. Thought content is paranoid. Cognition and memory are impaired. She expresses inappropriate judgment.    Review of Systems  Constitutional: Negative.   HENT: Negative.   Eyes: Negative.   Respiratory: Negative.   Cardiovascular: Negative.   Gastrointestinal: Negative.   Musculoskeletal: Negative.   Skin: Negative.   Neurological: Negative.   Psychiatric/Behavioral: Negative.  Negative for depression.    Blood pressure (!) 107/52, pulse (!) 168, temperature 99 F (37.2 C), temperature source Oral, resp. rate 18, height 5\' 4"  (1.626 m), weight 77.1 kg (170 lb), SpO2 96 %, not currently breastfeeding.Body mass index is 29.18 kg/m.  General Appearance: Disheveled  Eye Contact:  Minimal  Speech:  Slow  Volume:  Decreased  Mood:  Euthymic  Affect:  Constricted  Thought Process:  Disorganized  Orientation:  Negative  Thought Content:  Paranoid Ideation and Rumination  Suicidal Thoughts:  No  Homicidal Thoughts:  No  Memory:  Immediate;   Fair Recent;   Poor Remote;   Poor  Judgement:  Impaired  Insight:  Shallow  Psychomotor Activity:  Decreased  Concentration:  Concentration: Poor  Recall:  [employer] of Knowledge:  Fair  Language:  Fair  Akathisia:  No  Handed:  Right  AIMS (if indicated):     Assets:  Financial Resources/Insurance Housing Physical Health Resilience  ADL's:  Impaired  Cognition:  Impaired,  Mild  Sleep:  Number of Hours: 9  Treatment Plan Summary: Medication management and Plan 27 year old woman with chronic psychotic disorder multiple hospitalizations chronic noncompliance.  Continues to be paranoid and withdrawn  but is at least taking medicine.  Staff measured her pulse at 168 this morning but the patient's blood pressure is normal and she denied any symptoms of palpitations or dizziness.  EKG was performed and showed a pulse of 129 but with a normal sinus tachycardia and no signs of coronary artery disease.  Patient will continue on current doses of risk Toradol.  We can try eventually to work on convincing her to take long-acting injectables.  Encourage patient to be engaged in groups and activities and to eat and drink better.  No indication for any specific medicine change because of the tachycardia.  I reviewed her medicine and there is nothing there that I would expect to necessarily be causing a tachycardia particularly considering that her QT interval is normal.  Alethia Berthold, MD 07/27/2017, 1:24 PM

## 2017-07-27 NOTE — [facility] Group Notes (Signed)
LCSW Group Therapy Note  07/27/2017 1:15pm  Type of Therapy and Topic:  Group Therapy:  Cognitive Distortions  Participation Level:  Did Not Attend   Description of Group:    Patients in this group will be introduced to the topic of cognitive distortions.  Patients will identify and describe cognitive distortions, describe the feelings these distortions create for them.  Patients will identify one or more situations in their personal life where they have cognitively distorted thinking and will verbalize challenging this cognitive distortion through positive thinking skills.  Patients will practice the skill of using positive affirmations to challenge cognitive distortions using affirmation cards.    Therapeutic Goals:  1. Patient will identify two or more cognitive distortions they have used 2. Patient will identify one or more emotions that stem from use of a cognitive distortion 3. Patient will demonstrate use of a positive affirmation to counter a cognitive distortion through discussion and/or role play. 4. Patient will describe one way cognitive distortions can be detrimental to wellness   Summary of Patient Progress: Pt was invited to attend group but chose not to attend. CSW will continue to encourage pt to attend group throughout their admission.      Therapeutic Modalities:   Cognitive Behavioral Therapy Motivational Interviewing   Gillie Fleites  CUEBAS-COLON, LCSW 07/27/2017 10:01 AM   

## 2017-07-27 NOTE — Plan of Care (Signed)
Pt in room most of evening and came out for snacks and medications. Pt denies SI/HI. Pt is receptive to treatment and safety maintained on unit. Will continue to monitor. Problem: Education: Goal: Knowledge of [place] [facility] Education information/materials will improve Outcome: Progressing Goal: Emotional status will improve Outcome: Progressing Goal: Mental status will improve Outcome: Progressing Goal: Verbalization of understanding the information provided will improve Outcome: Progressing   Problem: Coping: Goal: Ability to verbalize frustrations and anger appropriately will improve Outcome: Progressing Goal: Ability to demonstrate self-control will improve Outcome: Progressing   Problem: Safety: Goal: Periods of time without injury will increase Outcome: Progressing   Problem: Medication: Goal: Compliance with prescribed medication regimen will improve Outcome: Progressing   Problem: Self-Concept: Goal: Ability to disclose and discuss suicidal ideas will improve Outcome: Progressing

## 2017-07-28 NOTE — Progress Notes (Signed)
[facility] MD Progress Note  07/28/2017 10:34 AM [name]  MRN:  [id number] Subjective: Follow-up note for 27 year old woman with schizoaffective disorder.  Patient seen chart reviewed.  Patient has no complaints today.  She was more interactive made better eye contact.  Affect euthymic and reactive.  Denies any hallucinations.  Denies any paranoia.  Denies suicidal thoughts.  Does not feel like she is having any complications from medicine.  Patient is eating better.  Reviewing her vitals I see that her heart rate while still slightly tachycardic is nowhere as high as it was yesterday suggesting that she is eating and drinking better. Principal Problem: Schizoaffective disorder, bipolar type ([place]) Diagnosis:   Patient Active Problem List   Diagnosis Date Noted  . Drug overdose [T50.901A] 07/22/2017  . Overdose [T50.901A] 07/22/2017  . Intentional acetaminophen overdose ([place]) [T39.1X2A]   . Metabolic acidosis [I94.8]   . Pancreatitis, acute [K85.90] 06/30/2017  . Acute pancreatitis [K85.90] 06/30/2017  . DUB (dysfunctional uterine bleeding) [N93.8] 11/22/2016  . Hyperprolactinemia ([place]) [E22.1] 08/20/2016  . Contraceptive management [Z30.9] 08/09/2016  . Tachycardia [R00.0] 01/13/2016  . 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 [Z14.8] 09/08/2015  . Seizure disorder ([place]) [N46.270] 08/08/2015  . Chronic migraine [G43.709] 07/27/2015  . Asthma [J45.909] 04/15/2015  . Schizoaffective disorder, bipolar type ([place]) [F25.0] 03/10/2014  . Borderline personality disorder ([place]) [F60.3] 03/10/2014  . PTSD (post-traumatic stress disorder) [F43.10] 03/10/2014  . Autism spectrum disorder [F84.0] 06/15/2013   Total Time spent with patient: 20 minutes  Past Psychiatric History: History of schizoaffective disorder med noncompliance frequent decompensations.  Past Medical History:  Past Medical History:  Diagnosis Date  . Acid reflux   . Asthma    last attack 03/13/15 or 03/14/15  . Autism   . Carrier  of fragile X syndrome   . Chronic constipation   . Depression   . Drug-seeking behavior   . Essential tremor   . Headache   . Personality disorder ([place])   . Schizo-affective psychosis ([place])   . Schizoaffective disorder, bipolar type ([place])   . Seizures [facility])    Last seizure December 2017    Past Surgical History:  Procedure Laterality Date  . MOUTH SURGERY  2009 or 2010   Family History:  Family History  Problem Relation Age of Onset  . Mental illness Father   . Asthma Father   . PDD Brother   . Seizures Brother    Family Psychiatric  History: Unknown Social History:  Social History   Substance and Sexual Activity  Alcohol Use No  . Alcohol/week: 0.6 oz  . Types: 1 Standard drinks or equivalent per week     Social History   Substance and Sexual Activity  Drug Use No   Comment: History of cocaine use at age 25 for 4 months    Social History   Socioeconomic History  . Marital status: Married    Spouse name: Not on file  . Number of children: 0  . Years of education: Not on file  . Highest education level: Not on file  Occupational History  . Occupation: disability  Social Needs  . Financial resource strain: Not on file  . Food insecurity:    Worry: Not on file    Inability: Not on file  . Transportation needs:    Medical: Not on file    Non-medical: Not on file  Tobacco Use  . Smoking status: Former Smoker    Packs/day: 0.00  . Smokeless tobacco: Never Used  .  Tobacco comment: Smoked for 2  years age [age]  Substance and Sexual Activity  . Alcohol use: No    Alcohol/week: 0.6 oz    Types: 1 Standard drinks or equivalent per week  . Drug use: No    Comment: History of cocaine use at age 14 for 4 months  . Sexual activity: Not Currently    Birth control/protection: None  Lifestyle  . Physical activity:    Days per week: Not on file    Minutes per session: Not on file  . Stress: Not on file  Relationships  . Social connections:    Talks on phone:  Not on file    Gets together: Not on file    Attends religious service: Not on file    Active member of club or organization: Not on file    Attends meetings of clubs or organizations: Not on file    Relationship status: Not on file  Other Topics Concern  . Not on file  Social History Narrative   Marital status: married; guardian is Furniture conservator/restorer      Children: none      Lives: with husband in apartment      Employment:  Disability      Tobacco: quit smoking; smoked for two years.      Alcohol ;none      Drugs: none   Has not traveled outside of the country.        Additional Social History:    Pain Medications: none Prescriptions: See PTA Over the Counter: See PTA History of alcohol / drug use?: Yes Longest period of sobriety (when/how long): 6 years Negative Consequences of Use: (none) Withdrawal Symptoms: (none)                    Sleep: Fair  Appetite:  Fair  Current Medications: Current Facility-Administered Medications  Medication Dose Route Frequency Provider Last Rate Last Dose  . acetaminophen (TYLENOL) tablet 650 mg  650 mg Oral Q6H PRN Pucilowska, Jolanta B, MD      . alum & mag hydroxide-simeth (MAALOX/MYLANTA) 200-200-20 MG/5ML suspension 30 mL  30 mL Oral Q4H PRN Pucilowska, Jolanta B, MD      . feeding supplement (ENSURE ENLIVE) (ENSURE ENLIVE) liquid 237 mL  237 mL Oral BID BM Pucilowska, Jolanta B, MD   237 mL at 07/27/17 1103  . hydrOXYzine (ATARAX/VISTARIL) tablet 50 mg  50 mg Oral TID PRN Pucilowska, Jolanta B, MD   50 mg at 07/26/17 0718  . levETIRAcetam (KEPPRA) tablet 500 mg  500 mg Oral BID Pucilowska, Jolanta B, MD   500 mg at 07/28/17 0852  . magnesium hydroxide (MILK OF MAGNESIA) suspension 30 mL  30 mL Oral Daily PRN Pucilowska, Jolanta B, MD      . multivitamin with minerals tablet 1 tablet  1 tablet Oral Daily Pucilowska, Jolanta B, MD   1 tablet at 07/28/17 0852  . prazosin (MINIPRESS) capsule 2 mg  2 mg Oral BID Pucilowska, Jolanta B, MD    2 mg at 07/28/17 0852  . risperiDONE (RISPERDAL) tablet 2 mg  2 mg Oral TID Pucilowska, Jolanta B, MD   2 mg at 07/28/17 0852  . sertraline (ZOLOFT) tablet 150 mg  150 mg Oral Daily Pucilowska, Jolanta B, MD   150 mg at 07/28/17 0852  . temazepam (RESTORIL) capsule 15 mg  15 mg Oral QHS Pucilowska, Jolanta B, MD   15 mg at 07/27/17 2125  . topiramate (TOPAMAX) tablet  100 mg  100 mg Oral QHS Pucilowska, Jolanta B, MD   100 mg at 07/27/17 2125    Lab Results: No results found for this or any previous visit (from the past 48 hour(s)).  Blood Alcohol level:  Lab Results  Component Value Date   ETH <10 07/21/2017   ETH <5 16/38/4536    Metabolic Disorder Labs: Lab Results  Component Value Date   HGBA1C 4.9 07/25/2017   MPG 93.93 07/25/2017   Lab Results  Component Value Date   PROLACTIN 129.0 (H) 07/25/2017   PROLACTIN 194.7 (H) 11/22/2016   Lab Results  Component Value Date   CHOL 178 07/25/2017   TRIG 99 07/25/2017   HDL 51 07/25/2017   CHOLHDL 3.5 07/25/2017   VLDL 20 07/25/2017   LDLCALC 107 (H) 07/25/2017   LDLCALC 99 07/16/2015    Physical Findings: AIMS:  , ,  ,  ,    CIWA:    COWS:     Musculoskeletal: Strength & Muscle Tone: within normal limits Gait & Station: normal Patient leans: N/A  Psychiatric Specialty Exam: Physical Exam  Nursing note and vitals reviewed. Constitutional: She appears well-developed and well-nourished.  HENT:  Head: Normocephalic and atraumatic.  Eyes: Pupils are equal, round, and reactive to light. Conjunctivae are normal.  Neck: Normal range of motion.  Cardiovascular: Regular rhythm and normal heart sounds.  Respiratory: Effort normal. No respiratory distress.  GI: Soft.  Musculoskeletal: Normal range of motion.  Neurological: She is alert.  Skin: Skin is warm and dry.  Psychiatric: She has a normal mood and affect. Her behavior is normal. Judgment and thought content normal.    Review of Systems  Constitutional:  Negative.   HENT: Negative.   Eyes: Negative.   Respiratory: Negative.   Cardiovascular: Negative.   Gastrointestinal: Negative.   Musculoskeletal: Negative.   Skin: Negative.   Neurological: Negative.   Psychiatric/Behavioral: Negative.     Blood pressure (!) 102/38, pulse (!) 104, temperature 98.5 F (36.9 C), temperature source Oral, resp. rate 18, height 5\' 4"  (1.626 m), weight 77.1 kg (170 lb), SpO2 100 %, not currently breastfeeding.Body mass index is 29.18 kg/m.  General Appearance: Casual  Eye Contact:  Good  Speech:  Clear and Coherent  Volume:  Decreased  Mood:  Euthymic  Affect:  Congruent  Thought Process:  Goal Directed  Orientation:  Full (Time, Place, and Person)  Thought Content:  Logical  Suicidal Thoughts:  No  Homicidal Thoughts:  No  Memory:  Immediate;   Fair Recent;   Fair Remote;   Fair  Judgement:  Fair  Insight:  Fair  Psychomotor Activity:  Decreased  Concentration:  Concentration: Fair  Recall:  [employer] of Knowledge:  Fair  Language:  Fair  Akathisia:  No  Handed:  Right  AIMS (if indicated):     Assets:  Desire for Improvement Resilience Social Support  ADL's:  Intact  Cognition:  Impaired,  Mild  Sleep:  Number of Hours: 7.5     Treatment Plan Summary: Daily contact with patient to assess and evaluate symptoms and progress in treatment, Medication management and Plan Patient seems to be stabilizing and improving.  Encouraged her to get out of bed and be interactive with people and to continue to eat well and take care of herself.  No change to treatment plan no new orders for today.  Alethia Berthold, MD 07/28/2017, 10:34 AM

## 2017-07-28 NOTE — Plan of Care (Addendum)
Patient found in common area upon my arrival. Patient is visible and somewhat social this evening. Patient continues to report improvement in mood but affect is less bright and animated than last night. Denies all complaints including SI/HI/AVH. Patient is not tearful tonight but was minimally verbal and unengaged upon assessment. Reports mood is "fine." Reports continued improvement in appetite. Denies pain. Voiding adequately. Compliant with HS medications and staff direction. Attended group. Q 15 minute checks maintained. Will continue to monitor throughout the shift. @2345 , patient remains unable to sleep despite Restoril. Given Vistaril. Will monitor for efficacy. Patient slept 5 hours. No apparent distress. Will endorse care to oncoming shift.  Problem: Education: Goal: Emotional status will improve Outcome: Progressing Goal: Mental status will improve Outcome: Progressing Goal: Verbalization of understanding the information provided will improve Outcome: Progressing   Problem: Activity: Goal: Interest or engagement in activities will improve Outcome: Progressing Goal: Sleeping patterns will improve Outcome: Progressing   Problem: Coping: Goal: Ability to demonstrate self-control will improve Outcome: Progressing   Problem: Medication: Goal: Compliance with prescribed medication regimen will improve Outcome: Progressing

## 2017-07-28 NOTE — [facility] Group Notes (Signed)
LCSW Group Therapy Note 07/28/2017 1:15pm  Type of Therapy and Topic: Group Therapy: Feelings Around Returning Home & Establishing a Supportive Framework and Supporting Oneself When Supports Not Available  Participation Level: Active  Description of Group:  Patients first processed thoughts and feelings about upcoming discharge. These included fears of upcoming changes, lack of change, new living environments, judgements and expectations from others and overall stigma of mental health issues. The group then discussed the definition of a supportive framework, what that looks and feels like, and how do to discern it from an unhealthy non-supportive network. The group identified different types of supports as well as what to do when your family/friends are less than helpful or unavailable  Therapeutic Goals  1. Patient will identify one healthy supportive network that they can use at discharge. 2. Patient will identify one factor of a supportive framework and how to tell it from an unhealthy network. 3. Patient able to identify one coping skill to use when they do not have positive supports from others. 4. Patient will demonstrate ability to communicate their needs through discussion and/or role plays.  Summary of Patient Progress:  Patient reported she feels "alone but happy." Pt engaged during group session. As patients processed their anxiety about discharge and described healthy supports patient shared that she is ready to be discharge but have some fears about her family and not being able to find a job. Patient listed her support system as the ACT team.  Patients identified at least one self-care tool they were willing to use after discharge; listening to music, coloring, watching netflix, and praying.   Therapeutic Modalities Cognitive Behavioral Therapy Motivational Interviewing   Jermale Crass  CUEBAS-COLON, LCSW 07/28/2017 11:09 AM

## 2017-07-28 NOTE — Progress Notes (Addendum)
Received Dala this AM in her room after breakfast, she was compliant with her medications. She verbalized feeling less depressed and anxious this AM, and denied feeling suicidal. On her  self inventory sheet she rated depression 6/10, anxiety 4/10 and feeling hopelessness 2/10. She remained isolative to her room throughout the day except for meals and sleeping.

## 2017-07-29 MED ORDER — TEMAZEPAM 15 MG PO CAPS
30.0000 mg | ORAL_CAPSULE | Freq: Every day | ORAL | Status: DC
  Administered 2017-07-29 – 2017-07-30 (×2): 30 mg via ORAL
  Filled 2017-07-29 (×2): qty 2

## 2017-07-29 NOTE — Progress Notes (Signed)
Recreation Therapy Notes  Date: 07/29/2017  Time: 9:30 am  Location: Craft room  Behavioral response: Appropriate   Intervention Topic: Stress  Discussion/Intervention: Group content on today was focused on stress. The group defined stress and ways to cope with stress. Participants expressed how they know when they are stresses out and certain triggers that stress them out. Individuals described the different ways they have to cope with stress. The group stated reasons why it is important to cope with stress. Patient explained what good stress is and some examples. The group participated in the intervention "Managing Stress". Individuals were able to identify new ways to cope with stress.   Clinical Observations/Feedback:  Patient came to group and made a positive contribution towards the group topic. Participant listened to what peers and staff had to say about the topic at hand and helped encourage peers. Individual was social with peers and staff while participating in the intervention.  Cahlil Sattar LRT/CTRS         Harvey Matlack 07/29/2017 12:46 PM

## 2017-07-29 NOTE — Progress Notes (Addendum)
[facility] MD Progress Note  07/29/2017 12:19 PM [name]  MRN:  [id number]  Subjective:   [name] reports feeling better and wants to be discharge on Wednesday. She seems more relaxed and smiling a little but is still in bed this morning, with her head covered. She seems still paranoid but reports speaking to her mother-in-law over the weekend. She had been really suspicious about her in laws. She is pleased with medications and reports no side effects.  Principal Problem: Schizoaffective disorder, bipolar type ([place]) Diagnosis:   Patient Active Problem List   Diagnosis Date Noted  . Schizoaffective disorder, bipolar type ([place]) [F25.0] 03/10/2014    Priority: High  . Drug overdose [T50.901A] 07/22/2017  . Overdose [T50.901A] 07/22/2017  . Intentional acetaminophen overdose ([place]) [T39.1X2A]   . Metabolic acidosis [S85.4]   . Pancreatitis, acute [K85.90] 06/30/2017  . Acute pancreatitis [K85.90] 06/30/2017  . DUB (dysfunctional uterine bleeding) [N93.8] 11/22/2016  . Hyperprolactinemia ([place]) [E22.1] 08/20/2016  . Contraceptive management [Z30.9] 08/09/2016  . Tachycardia [R00.0] 01/13/2016  . 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 [Z14.8] 09/08/2015  . Seizure disorder ([place]) [O27.035] 08/08/2015  . Chronic migraine [G43.709] 07/27/2015  . Asthma [J45.909] 04/15/2015  . Borderline personality disorder ([place]) [F60.3] 03/10/2014  . PTSD (post-traumatic stress disorder) [F43.10] 03/10/2014  . Autism spectrum disorder [F84.0] 06/15/2013   Total Time spent with patient: 20 minutes  Past Psychiatric History: schizoaffective disorder  Past Medical History:  Past Medical History:  Diagnosis Date  . Acid reflux   . Asthma    last attack 03/13/15 or 03/14/15  . Autism   . 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 Chronic constipation   . Depression   . Drug-seeking behavior   . Essential tremor   . Headache   . Personality disorder ([place])   . Schizo-affective psychosis ([place])   .  Schizoaffective disorder, bipolar type ([place])   . Seizures [facility])    Last seizure December 2017    Past Surgical History:  Procedure Laterality Date  . MOUTH SURGERY  2009 or 2010   Family History:  Family History  Problem Relation Age of Onset  . Mental illness Father   . Asthma Father   . PDD Brother   . Seizures Brother    Family Psychiatric  History: none Social History:  Social History   Substance and Sexual Activity  Alcohol Use No  . Alcohol/week: 0.6 oz  . Types: 1 Standard drinks or equivalent per week     Social History   Substance and Sexual Activity  Drug Use No   Comment: History of cocaine use at age 35 for 4 months    Social History   Socioeconomic History  . Marital status: Married    Spouse name: Not on file  . Number of children: 0  . Years of education: Not on file  . Highest education level: Not on file  Occupational History  . Occupation: disability  Social Needs  . Financial resource strain: Not on file  . Food insecurity:    Worry: Not on file    Inability: Not on file  . Transportation needs:    Medical: Not on file    Non-medical: Not on file  Tobacco Use  . Smoking status: Former Smoker    Packs/day: 0.00  . Smokeless tobacco: Never Used  . Tobacco comment: Smoked for 2  years age [age]  Substance and Sexual Activity  . Alcohol use: No    Alcohol/week: 0.6 oz  Types: 1 Standard drinks or equivalent per week  . Drug use: No    Comment: History of cocaine use at age 37 for 4 months  . Sexual activity: Not Currently    Birth control/protection: None  Lifestyle  . Physical activity:    Days per week: Not on file    Minutes per session: Not on file  . Stress: Not on file  Relationships  . Social connections:    Talks on phone: Not on file    Gets together: Not on file    Attends religious service: Not on file    Active member of club or organization: Not on file    Attends meetings of clubs or organizations: Not on file     Relationship status: Not on file  Other Topics Concern  . Not on file  Social History Narrative   Marital status: married; guardian is Furniture conservator/restorer      Children: none      Lives: with husband in apartment      Employment:  Disability      Tobacco: quit smoking; smoked for two years.      Alcohol ;none      Drugs: none   Has not traveled outside of the country.        Additional Social History:    Pain Medications: none Prescriptions: See PTA Over the Counter: See PTA History of alcohol / drug use?: Yes Longest period of sobriety (when/how long): 6 years Negative Consequences of Use: (none) Withdrawal Symptoms: (none)                    Sleep: Fair  Appetite:  Fair  Current Medications: Current Facility-Administered Medications  Medication Dose Route Frequency Provider Last Rate Last Dose  . acetaminophen (TYLENOL) tablet 650 mg  650 mg Oral Q6H PRN Ryen Heitmeyer B, MD      . alum & mag hydroxide-simeth (MAALOX/MYLANTA) 200-200-20 MG/5ML suspension 30 mL  30 mL Oral Q4H PRN Makhiya Coburn B, MD      . feeding supplement (ENSURE ENLIVE) (ENSURE ENLIVE) liquid 237 mL  237 mL Oral BID BM Hang Ammon B, MD   237 mL at 07/28/17 2021  . hydrOXYzine (ATARAX/VISTARIL) tablet 50 mg  50 mg Oral TID PRN Stefannie Defeo B, MD   50 mg at 07/28/17 2346  . levETIRAcetam (KEPPRA) tablet 500 mg  500 mg Oral BID Mehtab Dolberry B, MD   500 mg at 07/29/17 0922  . magnesium hydroxide (MILK OF MAGNESIA) suspension 30 mL  30 mL Oral Daily PRN Jerrard Bradburn B, MD      . multivitamin with minerals tablet 1 tablet  1 tablet Oral Daily Loura Pitt B, MD   1 tablet at 07/29/17 0921  . prazosin (MINIPRESS) capsule 2 mg  2 mg Oral BID Maja Mccaffery B, MD   2 mg at 07/29/17 1044  . risperiDONE (RISPERDAL) tablet 2 mg  2 mg Oral TID Marbella Markgraf B, MD   2 mg at 07/28/17 1652  . sertraline (ZOLOFT) tablet 150 mg  150 mg Oral Daily Yaden Seith,  Shem Plemmons B, MD   150 mg at 07/29/17 0921  . temazepam (RESTORIL) capsule 15 mg  15 mg Oral QHS Ayvah Caroll B, MD   15 mg at 07/28/17 2044  . topiramate (TOPAMAX) tablet 100 mg  100 mg Oral QHS Ariyan Sinnett B, MD   100 mg at 07/28/17 2044    Lab Results: No results found for this  or any previous visit (from the past 48 hour(s)).  Blood Alcohol level:  Lab Results  Component Value Date   ETH <10 07/21/2017   ETH <5 80/99/8338    Metabolic Disorder Labs: Lab Results  Component Value Date   HGBA1C 4.9 07/25/2017   MPG 93.93 07/25/2017   Lab Results  Component Value Date   PROLACTIN 129.0 (H) 07/25/2017   PROLACTIN 194.7 (H) 11/22/2016   Lab Results  Component Value Date   CHOL 178 07/25/2017   TRIG 99 07/25/2017   HDL 51 07/25/2017   CHOLHDL 3.5 07/25/2017   VLDL 20 07/25/2017   LDLCALC 107 (H) 07/25/2017   LDLCALC 99 07/16/2015    Physical Findings: AIMS:  , ,  ,  ,    CIWA:    COWS:     Musculoskeletal: Strength & Muscle Tone: within normal limits Gait & Station: normal Patient leans: N/A  Psychiatric Specialty Exam: Physical Exam  Nursing note and vitals reviewed. Psychiatric: Her speech is normal. Her affect is blunt. She is withdrawn. Thought content is paranoid. Cognition and memory are normal. She expresses impulsivity. She exhibits a depressed mood.    Review of Systems  Neurological: Negative.   Psychiatric/Behavioral: Positive for depression.  All other systems reviewed and are negative.   Blood pressure (!) 112/57, pulse 68, temperature 98 F (36.7 C), temperature source Oral, resp. rate 16, height 5\' 4"  (1.626 m), weight 77.1 kg (170 lb), SpO2 99 %, not currently breastfeeding.Body mass index is 29.18 kg/m.  General Appearance: Fairly Groomed  Eye Contact:  Minimal  Speech:  Clear and Coherent  Volume:  Normal  Mood:  Euthymic  Affect:  Flat  Thought Process:  Goal Directed and Descriptions of Associations: Intact  Orientation:   Full (Time, Place, and Person)  Thought Content:  WDL  Suicidal Thoughts:  No  Homicidal Thoughts:  No  Memory:  Immediate;   Fair Recent;   Fair Remote;   Fair  Judgement:  Poor  Insight:  Lacking  Psychomotor Activity:  Psychomotor Retardation  Concentration:  Concentration: Fair and Attention Span: Fair  Recall:  [employer] of Knowledge:  Fair  Language:  Fair  Akathisia:  No  Handed:  Right  AIMS (if indicated):     Assets:  Communication Skills Desire for Improvement Financial Resources/Insurance Housing Physical Health Resilience Social Support  ADL's:  Intact  Cognition:  WNL  Sleep:  Number of Hours: 5     Treatment Plan Summary: Daily contact with patient to assess and evaluate symptoms and progress in treatment and Medication management   [name] is a 27 year old female with a history of schizoaffective disorder transfer from [facility] medical floor where she was hospitalized after suicide attempt by overdose on multiple medications including Tylenol. She was paranoid, delusional and disorganized on admission. She has been improving and denies any symptoms of derpession, anxiety or psychosis.  #Psychosis/mood -continue Risperdal 2 mg TID, per patient wishes -continue Zoloft 150 mg daily  #Insomnia, slept 5 hours with Restoril 15 mg  -allergic to Seroquel  -increase Restoril 30 mg nightly  #PTSD -continue Minipress 2 mg BID  #Seizure disorder -continue Keppra 500 mg BID  #Migraine headache prevention -continue Topamax 100 mg nightly  #Hyperprolactinaemia -head CT scan when patient stable -PRL, most likely from Risperdal  #Weight loss -Ensure TID  #Labs -lipid panel, TSH, A1c are normal -EKG reviewed, NSR with QTc 469 -pregnancy test negative  #Disposition -discharge to her apartment -follow up with  [facility] ACT team     Orson Slick, MD 07/29/2017, 12:19 PM

## 2017-07-29 NOTE — Progress Notes (Signed)
Received [name] this AM in her room asleep,she was compliant with her medications. She verbalized feellng better and denied feeling suicidal. She rated anxiety 3 on her self inventory scale. She was OOB in the milieu for her meals and briefly throughout the day. Her affect is brighter this PM and she feels ready to go home on Wednesday.

## 2017-07-30 MED ORDER — PRAZOSIN HCL 2 MG PO CAPS: 2.0000 mg | ORAL_CAPSULE | Freq: Two times a day (BID) | ORAL | 1 refills | Status: DC

## 2017-07-30 MED ORDER — RISPERIDONE 2 MG PO TABS: 2.0000 mg | ORAL_TABLET | Freq: Three times a day (TID) | ORAL | 1 refills | Status: DC

## 2017-07-30 MED ORDER — LEVETIRACETAM 500 MG PO TABS: 500.0000 mg | ORAL_TABLET | Freq: Two times a day (BID) | ORAL | 1 refills | Status: DC

## 2017-07-30 MED ORDER — TOPIRAMATE 100 MG PO TABS: 100.0000 mg | ORAL_TABLET | Freq: Every day | ORAL | 1 refills | Status: DC

## 2017-07-30 MED ORDER — SERTRALINE HCL 100 MG PO TABS: 150.0000 mg | ORAL_TABLET | Freq: Every day | ORAL | 1 refills | Status: DC

## 2017-07-30 MED ORDER — TEMAZEPAM 30 MG PO CAPS: 30.0000 mg | ORAL_CAPSULE | Freq: Every day | ORAL | 1 refills | Status: DC

## 2017-07-30 NOTE — Plan of Care (Signed)
Voice no concerns around elimination  of bowels . Able to verbalize understand of information received . Mental and emotional status  improved . Attending unit programing Working on coping skills. No anger  outburst     Problem: Coping: Goal: Coping ability will improve Outcome: Progressing   Problem: Health Behavior/Discharge Planning: Goal: Identification of resources available to assist in meeting health care needs will improve Outcome: Progressing   Problem: Medication: Goal: Compliance with prescribed medication regimen will improve Outcome: Progressing   Problem: Self-Concept: Goal: Ability to disclose and discuss suicidal ideas will improve Outcome: Progressing Goal: Will verbalize positive feelings about self Outcome: Progressing

## 2017-07-30 NOTE — Progress Notes (Signed)
Recreation Therapy Notes  Date: 07/30/2017  Time: 9:30 am  Location: Craft room  Behavioral response: Appropriate   Intervention Topic: Happiness  Discussion/Intervention: Group content today was focused on Happiness. The group defined happiness and stated reasons they are and are not happy at times. Participants identified reasons they are normally happy and why. Individuals expressed how not being happy affects themselves and others. Patients stated reasons why happiness is important to them. The group described how they feel when they are happy. Individuals participated in the intervention "What is happiness" where they defined what happiness means to them.    Clinical Observations/Feedback:   Patient came to group and stated being happy brings up her mood.She stated that going to school and being with her daughter makes her happy. Individual described that being happy keeps her going in life. Patient identified being sick, death, fighting and someone putting you down are all thing that stops her from being happy. She was social with staff while participating in the intervention during group.  [name] LRT/CTRS         Saleen Peden 07/30/2017 10:39 AM

## 2017-07-30 NOTE — Progress Notes (Signed)
[facility] MD Progress Note  07/30/2017 3:58 PM Rolande Moe  MRN:  [id number] Subjective: Follow-up for 27 year old woman with a history of schizoaffective disorder recent suicide attempt.  Last couple days patient has shown significant improvement.  She is up out of her bed dressing appropriately taking care of her hygiene.  On interview the patient says her mood is much better and she feels ready for discharge.  She denies any wish to die or hurt her self.  She has been in touch with her mother-in-law and says she feels very comfortable with going back to her own apartment where her family will be nearby.  She is tolerating medication well and has no complaints about it.  Agrees to continue to follow up with her act team. Principal Problem: Schizoaffective disorder, bipolar type ([place]) Diagnosis:   Patient Active Problem List   Diagnosis Date Noted  . Drug overdose [T50.901A] 07/22/2017  . Overdose [T50.901A] 07/22/2017  . Intentional acetaminophen overdose ([place]) [T39.1X2A]   . Metabolic acidosis [M08.6]   . Pancreatitis, acute [K85.90] 06/30/2017  . Acute pancreatitis [K85.90] 06/30/2017  . DUB (dysfunctional uterine bleeding) [N93.8] 11/22/2016  . Hyperprolactinemia ([place]) [E22.1] 08/20/2016  . Contraceptive management [Z30.9] 08/09/2016  . Tachycardia [R00.0] 01/13/2016  . 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 [Z14.8] 09/08/2015  . Seizure disorder ([place]) [P61.950] 08/08/2015  . Chronic migraine [G43.709] 07/27/2015  . Asthma [J45.909] 04/15/2015  . Schizoaffective disorder, bipolar type ([place]) [F25.0] 03/10/2014  . Borderline personality disorder ([place]) [F60.3] 03/10/2014  . PTSD (post-traumatic stress disorder) [F43.10] 03/10/2014  . Autism spectrum disorder [F84.0] 06/15/2013   Total Time spent with patient: 30 minutes  Past Psychiatric History: Long history of schizoaffective disorder past history of suicide attempts  Past Medical History:  Past Medical History:  Diagnosis Date  .  Acid reflux   . Asthma    last attack 03/13/15 or 03/14/15  . Autism   . 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 Chronic constipation   . Depression   . Drug-seeking behavior   . Essential tremor   . Headache   . Personality disorder ([place])   . Schizo-affective psychosis ([place])   . Schizoaffective disorder, bipolar type ([place])   . Seizures [facility])    Last seizure December 2017    Past Surgical History:  Procedure Laterality Date  . MOUTH SURGERY  2009 or 2010   Family History:  Family History  Problem Relation Age of Onset  . Mental illness Father   . Asthma Father   . PDD Brother   . Seizures Brother    Family Psychiatric  History: None known Social History:  Social History   Substance and Sexual Activity  Alcohol Use No  . Alcohol/week: 0.6 oz  . Types: 1 Standard drinks or equivalent per week     Social History   Substance and Sexual Activity  Drug Use No   Comment: History of cocaine use at age 33 for 4 months    Social History   Socioeconomic History  . Marital status: Married    Spouse name: Not on file  . Number of children: 0  . Years of education: Not on file  . Highest education level: Not on file  Occupational History  . Occupation: disability  Social Needs  . Financial resource strain: Not on file  . Food insecurity:    Worry: Not on file    Inability: Not on file  . Transportation needs:    Medical: Not on file  Non-medical: Not on file  Tobacco Use  . Smoking status: Former Smoker    Packs/day: 0.00  . Smokeless tobacco: Never Used  . Tobacco comment: Smoked for 2  years age [age]  Substance and Sexual Activity  . Alcohol use: No    Alcohol/week: 0.6 oz    Types: 1 Standard drinks or equivalent per week  . Drug use: No    Comment: History of cocaine use at age 63 for 4 months  . Sexual activity: Not Currently    Birth control/protection: None  Lifestyle  . Physical activity:    Days per week: Not on file    Minutes per session: Not on file   . Stress: Not on file  Relationships  . Social connections:    Talks on phone: Not on file    Gets together: Not on file    Attends religious service: Not on file    Active member of club or organization: Not on file    Attends meetings of clubs or organizations: Not on file    Relationship status: Not on file  Other Topics Concern  . Not on file  Social History Narrative   Marital status: married; guardian is Furniture conservator/restorer      Children: none      Lives: with husband in apartment      Employment:  Disability      Tobacco: quit smoking; smoked for two years.      Alcohol ;none      Drugs: none   Has not traveled outside of the country.        Additional Social History:    Pain Medications: none Prescriptions: See PTA Over the Counter: See PTA History of alcohol / drug use?: Yes Longest period of sobriety (when/how long): 6 years Negative Consequences of Use: (none) Withdrawal Symptoms: (none)                    Sleep: Good  Appetite:  Good  Current Medications: Current Facility-Administered Medications  Medication Dose Route Frequency Provider Last Rate Last Dose  . acetaminophen (TYLENOL) tablet 650 mg  650 mg Oral Q6H PRN Pucilowska, Jolanta B, MD      . alum & mag hydroxide-simeth (MAALOX/MYLANTA) 200-200-20 MG/5ML suspension 30 mL  30 mL Oral Q4H PRN Pucilowska, Jolanta B, MD      . feeding supplement (ENSURE ENLIVE) (ENSURE ENLIVE) liquid 237 mL  237 mL Oral BID BM Pucilowska, Jolanta B, MD   237 mL at 07/30/17 1030  . hydrOXYzine (ATARAX/VISTARIL) tablet 50 mg  50 mg Oral TID PRN Pucilowska, Jolanta B, MD   50 mg at 07/28/17 2346  . levETIRAcetam (KEPPRA) tablet 500 mg  500 mg Oral BID Pucilowska, Jolanta B, MD   500 mg at 07/30/17 0820  . magnesium hydroxide (MILK OF MAGNESIA) suspension 30 mL  30 mL Oral Daily PRN Pucilowska, Jolanta B, MD      . multivitamin with minerals tablet 1 tablet  1 tablet Oral Daily Pucilowska, Jolanta B, MD   1 tablet at 07/30/17  0819  . prazosin (MINIPRESS) capsule 2 mg  2 mg Oral BID Pucilowska, Jolanta B, MD   2 mg at 07/30/17 0819  . risperiDONE (RISPERDAL) tablet 2 mg  2 mg Oral TID Pucilowska, Jolanta B, MD   2 mg at 07/30/17 1141  . sertraline (ZOLOFT) tablet 150 mg  150 mg Oral Daily Pucilowska, Jolanta B, MD   150 mg at 07/30/17 0820  .  temazepam (RESTORIL) capsule 30 mg  30 mg Oral QHS Pucilowska, Jolanta B, MD   30 mg at 07/29/17 2120  . topiramate (TOPAMAX) tablet 100 mg  100 mg Oral QHS Pucilowska, Jolanta B, MD   100 mg at 07/29/17 2120    Lab Results: No results found for this or any previous visit (from the past 48 hour(s)).  Blood Alcohol level:  Lab Results  Component Value Date   ETH <10 07/21/2017   ETH <5 81/85/6314    Metabolic Disorder Labs: Lab Results  Component Value Date   HGBA1C 4.9 07/25/2017   MPG 93.93 07/25/2017   Lab Results  Component Value Date   PROLACTIN 129.0 (H) 07/25/2017   PROLACTIN 194.7 (H) 11/22/2016   Lab Results  Component Value Date   CHOL 178 07/25/2017   TRIG 99 07/25/2017   HDL 51 07/25/2017   CHOLHDL 3.5 07/25/2017   VLDL 20 07/25/2017   LDLCALC 107 (H) 07/25/2017   LDLCALC 99 07/16/2015    Physical Findings: AIMS:  , ,  ,  ,    CIWA:    COWS:     Musculoskeletal: Strength & Muscle Tone: within normal limits Gait & Station: normal Patient leans: N/A  Psychiatric Specialty Exam: Physical Exam  Nursing note and vitals reviewed. Constitutional: She appears well-developed and well-nourished.  HENT:  Head: Normocephalic and atraumatic.  Eyes: Pupils are equal, round, and reactive to light. Conjunctivae are normal.  Neck: Normal range of motion.  Cardiovascular: Regular rhythm and normal heart sounds.  Respiratory: Effort normal. No respiratory distress.  GI: Soft.  Musculoskeletal: Normal range of motion.  Neurological: She is alert.  Skin: Skin is warm and dry.  Psychiatric: She has a normal mood and affect. Her speech is normal and  behavior is normal. Judgment and thought content normal. Cognition and memory are normal.    Review of Systems  Constitutional: Negative.   HENT: Negative.   Eyes: Negative.   Respiratory: Negative.   Cardiovascular: Negative.   Gastrointestinal: Negative.   Musculoskeletal: Negative.   Skin: Negative.   Neurological: Negative.   Psychiatric/Behavioral: Negative.     Blood pressure 115/60, pulse 91, temperature 97.7 F (36.5 C), temperature source Oral, resp. rate 16, height 5\' 4"  (1.626 m), weight 77.1 kg (170 lb), SpO2 100 %, not currently breastfeeding.Body mass index is 29.18 kg/m.  General Appearance: Fairly Groomed  Eye Contact:  Good  Speech:  Slow  Volume:  Decreased  Mood:  Euthymic  Affect:  Congruent  Thought Process:  Goal Directed  Orientation:  Full (Time, Place, and Person)  Thought Content:  Logical  Suicidal Thoughts:  No  Homicidal Thoughts:  No  Memory:  Immediate;   Fair Recent;   Fair Remote;   Fair  Judgement:  Fair  Insight:  Fair  Psychomotor Activity:  Normal  Concentration:  Concentration: Fair  Recall:  [employer] of Knowledge:  Fair  Language:  Fair  Akathisia:  No  Handed:  Right  AIMS (if indicated):     Assets:  Desire for Improvement Physical Health Resilience Social Support  ADL's:  Intact  Cognition:  WNL  Sleep:  Number of Hours: 5     Treatment Plan Summary: Daily contact with patient to assess and evaluate symptoms and progress in treatment, Medication management and Plan Patient appears to be much improved.  Denies any suicidal thoughts.  Able to articulate appropriate plans for the future.  Able to articulate appropriate ways to deal with  stressful situations in the future.  Agrees to follow up with her act team.  Vital signs are all stable.  Labs appear adequate.  Patient is agreeable to continuing the current medicine.  I am going to agree with the plan for discharge tomorrow to her own apartment and follow-up with her act  team.  Case reviewed with social work.  Prescriptions prepared.  Alethia Berthold, MD 07/30/2017, 3:58 PM

## 2017-07-30 NOTE — Progress Notes (Signed)
D: Patient stated slept good last night .Stated appetite is good and energy level  Is normal. Stated concentration is good . Stated on Depression scale 0 , hopeless 0 and anxiety 3 .( low 0-10 high) Denies suicidal  homicidal ideations  .  No auditory hallucinations  No pain concerns . Appropriate ADL'S. Interacting with peers and staff.  Voice no concerns around elimination  of bowels . Able to verbalize understand of information received . Mental and emotional status  improved . Attending unit programing Working on coping skills. No anger  outburst   fVoice no concerns around elimination  of bowels . Able to verbalize understand of information received . Mental and emotional status  improved . Attending unit programing Working on coping skills. No anger  outburst  Patient  Is aware of discharge tomorrow    A: Encourage patient participation with unit programming . Instruction  Given on  Medication , verbalize understanding. R: Voice no other concerns. Staff continue to monitor

## 2017-07-30 NOTE — BHH Suicide Risk Assessment (Signed)
[facility] Discharge Suicide Risk Assessment   Principal Problem: Schizoaffective disorder, bipolar type [facility]) Discharge Diagnoses:  Patient Active Problem List   Diagnosis Date Noted  . Drug overdose [T50.901A] 07/22/2017  . Overdose [T50.901A] 07/22/2017  . Intentional acetaminophen overdose ([place]) [T39.1X2A]   . Metabolic acidosis [G86.7]   . Pancreatitis, acute [K85.90] 06/30/2017  . Acute pancreatitis [K85.90] 06/30/2017  . DUB (dysfunctional uterine bleeding) [N93.8] 11/22/2016  . Hyperprolactinemia ([place]) [E22.1] 08/20/2016  . Contraceptive management [Z30.9] 08/09/2016  . Tachycardia [R00.0] 01/13/2016  . 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 [Z14.8] 09/08/2015  . Seizure disorder ([place]) [Y19.509] 08/08/2015  . Chronic migraine [G43.709] 07/27/2015  . Asthma [J45.909] 04/15/2015  . Schizoaffective disorder, bipolar type ([place]) [F25.0] 03/10/2014  . Borderline personality disorder ([place]) [F60.3] 03/10/2014  . PTSD (post-traumatic stress disorder) [F43.10] 03/10/2014  . Autism spectrum disorder [F84.0] 06/15/2013    Total Time spent with patient: 45 minutes  Musculoskeletal: Strength & Muscle Tone: within normal limits Gait & Station: normal Patient leans: N/A  Psychiatric Specialty Exam: Review of Systems  Constitutional: Negative.   HENT: Negative.   Eyes: Negative.   Respiratory: Negative.   Cardiovascular: Negative.   Gastrointestinal: Negative.   Musculoskeletal: Negative.   Skin: Negative.   Neurological: Negative.   Psychiatric/Behavioral: Negative.     Blood pressure 115/60, pulse 91, temperature 97.7 F (36.5 C), temperature source Oral, resp. rate 16, height 5\' 4"  (1.626 m), weight 77.1 kg (170 lb), SpO2 100 %, not currently breastfeeding.Body mass index is 29.18 kg/m.  General Appearance: Casual  Eye Contact::  Good  Speech:  Clear and [id number]  Volume:  Decreased  Mood:  Euthymic  Affect:  Congruent  Thought Process:  Goal Directed  Orientation:  Full (Time,  Place, and Person)  Thought Content:  Logical  Suicidal Thoughts:  No  Homicidal Thoughts:  No  Memory:  Immediate;   Fair Recent;   Fair Remote;   Fair  Judgement:  Fair  Insight:  Fair  Psychomotor Activity:  Decreased  Concentration:  Good  Recall:  Good  Fund of Knowledge:Good  Language: Good  Akathisia:  No  Handed:  Right  AIMS (if indicated):     Assets:  Desire for Improvement Housing Resilience  Sleep:  Number of Hours: 5  Cognition: WNL  ADL's:  Intact   Mental Status Per Nursing Assessment::   On Admission:  Self-harm behaviors  Demographic Factors:  Caucasian and Living alone  Loss Factors: Decline in physical health  Historical Factors: Prior suicide attempts and Impulsivity  Risk Reduction Factors:   Sense of responsibility to family, Positive social support and Positive therapeutic relationship  Continued Clinical Symptoms:  Schizophrenia:   Depressive state  Cognitive Features That Contribute To Risk:  Loss of executive function    Suicide Risk:  Mild:  Suicidal ideation of limited frequency, intensity, duration, and specificity.  There are no identifiable plans, no associated intent, mild dysphoria and related symptoms, good self-control (both objective and subjective assessment), few other risk factors, and identifiable protective factors, including available and accessible social support.  Follow-up Information    [facility].   Specialty:  Behavioral Health Why:  Follow-up with your ACTT. Contact information: [place] [address] [phone]           Plan Of Care/Follow-up recommendations:  Activity:  Activity as tolerated Diet:  Regular diet Other:  Follow-up with outpatient mental health.  Continue current medicine.  Alethia Berthold, MD 07/30/2017, 4:02 PM

## 2017-07-30 NOTE — BHH Group Notes (Signed)
[place] Group Notes:  (Nursing/MHT/Case Management/Adjunct)  Date:  07/30/2017  Time:  9:00 PM  Type of Therapy:  Group Therapy  Participation Level:  Active  Participation Quality:  Appropriate  Affect:  Anxious and Appropriate  Cognitive:  Appropriate  Insight:  Appropriate  Engagement in Group:  Engaged  Modes of Intervention:  Support  Summary of Progress/Problems:  [name] 07/30/2017, 9:00 PM

## 2017-07-30 NOTE — BHH Group Notes (Signed)
LCSW Group Therapy Note   07/29/2017 1:00pm   Type of Therapy and Topic:  Group Therapy:  Overcoming Obstacles   Participation Level:  Active   Description of Group:    In this group patients will be encouraged to explore what they see as obstacles to their own wellness and recovery. They will be guided to discuss their thoughts, feelings, and behaviors related to these obstacles. The group will process together ways to cope with barriers, with attention given to specific choices patients can make. Each patient will be challenged to identify changes they are motivated to make in order to overcome their obstacles. This group will be process-oriented, with patients participating in exploration of their own experiences as well as giving and receiving support and challenge from other group members.   Therapeutic Goals: 1. Patient will identify personal and current obstacles as they relate to admission. 2. Patient will identify barriers that currently interfere with their wellness or overcoming obstacles.  3. Patient will identify feelings, thought process and behaviors related to these barriers. 4. Patient will identify two changes they are willing to make to overcome these obstacles:      Summary of Patient Progress Pt able to meet above therapeutic goals.  She shares she is sad that she has lost custody of her 22 month old daughter.  She is hopeful that she will have an opportunity to work towards gaining that back and shares she is hopeful with her ACTT team's increased support that she will be able to be more stable.     Therapeutic Modalities:   Cognitive Behavioral Therapy Solution Focused Therapy Motivational Interviewing Relapse Prevention Therapy  Evelyn Saucer, LCSW 07/30/2017 8:56 AM

## 2017-07-30 NOTE — Plan of Care (Signed)
Patient is stable and cooperating with medical regimen, expressing no concerns and no side effects from medications, patient is socializing with peers and attends groups,with out any issues. Denies any [place] endorse depression 6/10. Problem: Elimination: Goal: Will not experience complications related to bowel motility Outcome: Progressing   Problem: Education: Goal: Knowledge of [place] [facility] Education information/materials will improve Outcome: Progressing Goal: Emotional status will improve Outcome: Progressing Goal: Mental status will improve Outcome: Progressing Goal: Verbalization of understanding the information provided will improve Outcome: Progressing   Problem: Activity: Goal: Interest or engagement in activities will improve Outcome: Progressing Goal: Sleeping patterns will improve Outcome: Progressing   Problem: Coping: Goal: Ability to verbalize frustrations and anger appropriately will improve Outcome: Progressing Goal: Ability to demonstrate self-control will improve Outcome: Progressing   Problem: Health Behavior/Discharge Planning: Goal: Identification of resources available to assist in meeting health care needs will improve Outcome: Progressing Goal: Compliance with treatment plan for underlying cause of condition will improve Outcome: Progressing   Problem: Safety: Goal: Periods of time without injury will increase Outcome: Progressing   Problem: Education: Goal: Ability to make informed decisions regarding treatment will improve Outcome: Progressing   Problem: Coping: Goal: Coping ability will improve Outcome: Progressing   Problem: Health Behavior/Discharge Planning: Goal: Identification of resources available to assist in meeting health care needs will improve Outcome: Progressing   Problem: Medication: Goal: Compliance with prescribed medication regimen will improve Outcome: Progressing   Problem: Self-Concept: Goal: Ability to  disclose and discuss suicidal ideas will improve Outcome: Progressing Goal: Will verbalize positive feelings about self Outcome: Progressing

## 2017-07-30 NOTE — BHH Group Notes (Signed)
CSW Group Therapy Note  07/30/2017  Time:  0900  Type of Therapy and Topic: Group Therapy: Goals Group: SMART Goals    Participation Level:  Active    Description of Group:   The purpose of a daily goals group is to assist and guide patients in setting recovery/wellness-related goals. The objective is to set goals as they relate to the crisis in which they were admitted. Patients will be using SMART goal modalities to set measurable goals. Characteristics of realistic goals will be discussed and patients will be assisted in setting and processing how one will reach their goal. Facilitator will also assist patients in applying interventions and coping skills learned in psycho-education groups to the SMART goal and process how one will achieve defined goal.    Therapeutic Goals:  -Patients will develop and document one goal related to or their crisis in which brought them into treatment.  -Patients will be guided by LCSW using SMART goal setting modality in how to set a measurable, attainable, realistic and time sensitive goal.  -Patients will process barriers in reaching goal.  -Patients will process interventions in how to overcome and successful in reaching goal.    Patient's Goal:   Pt continues to work towards their tx goals but has not yet reached them. Pt was able to appropriately participate in group discussion, and was able to offer support/validation to other group members. Pt reported her goal for the day is, "to talk to the doctor and social worker at least once about who is picking me up when I'm discharged, by the end of today."   Therapeutic Modalities:  Motivational Interviewing  Cognitive Behavioral Therapy  Crisis Intervention Model  SMART goals setting  Alden Hipp, MSW, LCSW Clinical Social Worker 07/30/2017 9:30 AM

## 2017-07-30 NOTE — BHH Group Notes (Signed)
07/30/2017 1PM  Type of Therapy/Topic:  Group Therapy:  Feelings about Diagnosis  Participation Level:  Active   Description of Group:   This group will allow patients to explore their thoughts and feelings about diagnoses they have received. Patients will be guided to explore their level of understanding and acceptance of these diagnoses. Facilitator will encourage patients to process their thoughts and feelings about the reactions of others to their diagnosis and will guide patients in identifying ways to discuss their diagnosis with significant others in their lives. This group will be process-oriented, with patients participating in exploration of their own experiences, giving and receiving support, and processing challenge from other group members.   Therapeutic Goals: 1. Patient will demonstrate understanding of diagnosis as evidenced by identifying two or more symptoms of the disorder 2. Patient will be able to express two feelings regarding the diagnosis 3. Patient will demonstrate their ability to communicate their needs through discussion and/or role play  Summary of Patient Progress: Actively and appropriately engaged in the group. Patient was able to provide support and validation to other group members.Patient practiced active listening when interacting with the facilitator and other group members. Naelani reports symptoms of "sadness and mood swings". "I get mood swings when my medications are up and down. I agree with most things regarding my diagnosis besides the auditory hallucinations. Those only happen when my medications are off."  Patient is still in the process of obtaining treatment goals.        Therapeutic Modalities:   Cognitive Behavioral Therapy Brief Therapy Feelings Identification    Darin Engels, [place] 07/30/2017 2:01 PM

## 2017-07-31 NOTE — Progress Notes (Signed)
Recreation Therapy Notes          Millisa Giarrusso 07/31/2017 1:04 PM

## 2017-07-31 NOTE — Plan of Care (Signed)
Patient is happy and exited for discharge, cooperating and participating in groups , voice no concerns , 15 minute safety rounding is maintained, patient denies Si/HI no signs of  AVH. Problem: Elimination: Goal: Will not experience complications related to bowel motility Outcome: Progressing   Problem: Education: Goal: Knowledge of [place] [facility] Education information/materials will improve Outcome: Progressing Goal: Emotional status will improve Outcome: Progressing Goal: Mental status will improve Outcome: Progressing Goal: Verbalization of understanding the information provided will improve Outcome: Progressing   Problem: Activity: Goal: Interest or engagement in activities will improve Outcome: Progressing Goal: Sleeping patterns will improve Outcome: Progressing   Problem: Coping: Goal: Ability to verbalize frustrations and anger appropriately will improve Outcome: Progressing Goal: Ability to demonstrate self-control will improve Outcome: Progressing   Problem: Health Behavior/Discharge Planning: Goal: Identification of resources available to assist in meeting health care needs will improve Outcome: Progressing Goal: Compliance with treatment plan for underlying cause of condition will improve Outcome: Progressing   Problem: Safety: Goal: Periods of time without injury will increase Outcome: Progressing   Problem: Education: Goal: Ability to make informed decisions regarding treatment will improve Outcome: Progressing   Problem: Coping: Goal: Coping ability will improve Outcome: Progressing   Problem: Health Behavior/Discharge Planning: Goal: Identification of resources available to assist in meeting health care needs will improve Outcome: Progressing   Problem: Medication: Goal: Compliance with prescribed medication regimen will improve Outcome: Progressing   Problem: Self-Concept: Goal: Ability to disclose and discuss suicidal ideas will  improve Outcome: Progressing Goal: Will verbalize positive feelings about self Outcome: Progressing

## 2017-07-31 NOTE — Progress Notes (Signed)
  [facility] Adult Case Management Discharge Plan :  Will you be returning to the same living situation after discharge:  Yes,  Pt will be discharging back to her home. At discharge, do you have transportation home?: Yes,  The sheriff will be transporting pt back to her home. Do you have the ability to pay for your medications: Yes,  Pt has financial means to pay for her medications.  Release of information consent forms completed and in the chart;  Patient's signature needed at discharge.  Patient to Follow up at: Follow-up Information    [facility] Follow up on 08/01/2017.   Specialty:  Behavioral Health Why:  Dr. Josetta Huddle with your ACTT will be visiting with you on Thursday, August 01, 2017 at your home. Contact information: [address] [place] [address] [phone]           Next level of care provider has access to [place] and Suicide Prevention discussed: Yes,  No safety concerns noted by pt.  Have you used any form of tobacco in the last 30 days? (Cigarettes, Smokeless Tobacco, Cigars, and/or Pipes): No  Has patient been referred to the Quitline?: N/A patient is not a smoker  Patient has been referred for addiction treatment: [place], LCSW 07/31/2017, 9:39 AM

## 2017-07-31 NOTE — Progress Notes (Signed)
Recreation Therapy Notes  INPATIENT RECREATION TR PLAN  Patient Details Name: [name] MRN: [id number] DOB: Feb 23, 1990 Today's Date: 07/31/2017  Rec Therapy Plan Is patient appropriate for Therapeutic Recreation?: Yes Treatment times per week: at least 3 Estimated Length of Stay: 5-7 days TR Treatment/Interventions: Group participation (Comment)  Discharge Criteria Pt will be discharged from therapy if:: Discharged Treatment plan/goals/alternatives discussed and agreed upon by:: Patient/family  Discharge Summary Short term goals set: Patient will engage in groups without prompting or encouragement from LRT x3 group sessions within 5 recreation therapy group sessions Short term goals met: Complete Progress toward goals comments: Groups attended Which groups?: Stress management, Other (Comment)(Happiness., Self-care) Reason goals not met: N/A Therapeutic equipment acquired: N/A Reason patient discharged from therapy: Discharge from hospital Pt/family agrees with progress & goals achieved: Yes Date patient discharged from therapy: 07/31/17   Azharia Surratt 07/31/2017, 1:06 PM

## 2017-08-05 ENCOUNTER — Emergency Department (HOSPITAL_COMMUNITY)
Admission: EM | Admit: 2017-08-05 | Discharge: 2017-08-06 | Disposition: A | Discharge status: Home / Self Care | Payer: PPO | Attending: Emergency Medicine | Admitting: Emergency Medicine

## 2017-08-05 ENCOUNTER — Other Ambulatory Visit: Payer: Self-pay

## 2017-08-05 ENCOUNTER — Encounter (HOSPITAL_COMMUNITY): Payer: Self-pay | Admitting: Emergency Medicine

## 2017-08-05 DIAGNOSIS — F603 Borderline personality disorder: Secondary | ICD-10-CM | POA: Diagnosis present

## 2017-08-05 DIAGNOSIS — Z87891 Personal history of nicotine dependence: Secondary | ICD-10-CM | POA: Diagnosis not present

## 2017-08-05 DIAGNOSIS — R45851 Suicidal ideations: Secondary | ICD-10-CM | POA: Diagnosis not present

## 2017-08-05 DIAGNOSIS — J45909 Unspecified asthma, uncomplicated: Secondary | ICD-10-CM | POA: Diagnosis not present

## 2017-08-05 DIAGNOSIS — Z79899 Other long term (current) drug therapy: Secondary | ICD-10-CM | POA: Diagnosis not present

## 2017-08-05 DIAGNOSIS — F840 Autistic disorder: Secondary | ICD-10-CM | POA: Insufficient documentation

## 2017-08-05 DIAGNOSIS — F250 Schizoaffective disorder, bipolar type: Secondary | ICD-10-CM | POA: Diagnosis present

## 2017-08-05 DIAGNOSIS — F329 Major depressive disorder, single episode, unspecified: Secondary | ICD-10-CM | POA: Diagnosis present

## 2017-08-05 HISTORY — DX: Poisoning by 4-aminophenol derivatives, accidental (unintentional), initial encounter: T39.1X1A

## 2017-08-05 LAB — CBC
HCT: 44.9 % (ref 36.0–46.0)
Hemoglobin: 14 g/dL (ref 12.0–15.0)
MCH: 27.6 pg (ref 26.0–34.0)
MCHC: 31.2 g/dL (ref 30.0–36.0)
MCV: 88.4 fL (ref 78.0–100.0)
Platelets: 265 10*3/uL (ref 150–400)
RBC: 5.08 MIL/uL (ref 3.87–5.11)
RDW: 12.8 % (ref 11.5–15.5)
WBC: 8.8 10*3/uL (ref 4.0–10.5)

## 2017-08-05 LAB — COMPREHENSIVE METABOLIC PANEL
ALT: 21 U/L (ref 0–44)
AST: 24 U/L (ref 15–41)
Albumin: 4.5 g/dL (ref 3.5–5.0)
Alkaline Phosphatase: 74 U/L (ref 38–126)
Anion gap: 9 (ref 5–15)
BUN: 10 mg/dL (ref 6–20)
CO2: 25 mmol/L (ref 22–32)
Calcium: 9.9 mg/dL (ref 8.9–10.3)
Chloride: 110 mmol/L (ref 98–111)
Creatinine, Ser: 0.94 mg/dL (ref 0.44–1.00)
GFR calc Af Amer: >60 mL/min (ref >60)
GFR calc non Af Amer: >60 mL/min (ref >60)
Glucose, Bld: 98 mg/dL (ref 70–99)
Potassium: 3.9 mmol/L (ref 3.5–5.1)
Sodium: 144 mmol/L (ref 135–145)
Total Bilirubin: 0.5 mg/dL (ref 0.3–1.2)
Total Protein: 7.5 g/dL (ref 6.5–8.1)

## 2017-08-05 LAB — I-STAT BETA HCG BLOOD, ED (MC, WL, AP ONLY): I-stat hCG, quantitative: <5 m[IU]/mL (ref <5)

## 2017-08-05 LAB — RAPID URINE DRUG SCREEN, HOSP PERFORMED
Amphetamines: NOT DETECTED
Benzodiazepines: POSITIVE — AB
Cocaine: NOT DETECTED
Opiates: NOT DETECTED
Tetrahydrocannabinol: NOT DETECTED

## 2017-08-05 LAB — ACETAMINOPHEN LEVEL: Acetaminophen (Tylenol), Serum: <10 ug/mL — ABNORMAL LOW (ref 10–30)

## 2017-08-05 LAB — ETHANOL: Alcohol, Ethyl (B): <10 mg/dL (ref <10)

## 2017-08-05 LAB — SALICYLATE LEVEL: Salicylate Lvl: <7 mg/dL (ref 2.8–30.0)

## 2017-08-05 MED ORDER — LORAZEPAM 2 MG/ML IJ SOLN: 1.0000 mg | Freq: Once | INTRAMUSCULAR | Status: DC
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
## 2017-08-05 NOTE — ED Notes (Signed)
TTS in process-Monique,RN  

## 2017-08-05 NOTE — ED Notes (Signed)
NP has been made aware that patient is requesting to leave; IVC paperwork in process; Security on stand by due to patient has been to desk twice asking to [facility]

## 2017-08-05 NOTE — ED Notes (Addendum)
Patient in room having conversation with herself while waiting for TTS to [facility]

## 2017-08-05 NOTE — ED Triage Notes (Addendum)
Pt states she took her last dose of her psychiatric mediations today at 4pm. Denies suicide attempt but endorses suicidal ideation with a plan to cut her wrist. Pt states "I want to go to behavioral health or [facility] denies homicidal thoughts. Pt had a suicidal attempt 1 week ago when she intentionally OD'd on her medications.

## 2017-08-05 NOTE — ED Notes (Addendum)
Belongings inventoried and placed in locker #5 

## 2017-08-05 NOTE — ED Provider Notes (Signed)
[facility] EMERGENCY DEPARTMENT Provider Note   CSN: [id number] Arrival date & time: 08/05/17  1940     History   Chief Complaint Chief Complaint  Patient presents with  . Suicidal    HPI [name] is a 27 y.o. female.  Patient presents to the ED with suicidal ideations. No HI, hallucinations. She reports suicide attempt 2 weeks ago and denies any self harm prior to arrival tonight. She denies physical complaints.  The history is provided by the patient. No language interpreter was used.    Past Medical History:  Diagnosis Date  . Acid reflux   . Asthma    last attack 03/13/15 or 03/14/15  . Autism   . 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 Chronic constipation   . Depression   . Drug-seeking behavior   . Essential tremor   . Headache   . Personality disorder ([place])   . Schizo-affective psychosis ([place])   . Schizoaffective disorder, bipolar type ([place])   . Seizures [facility])    Last seizure December 2017    Patient Active Problem List   Diagnosis Date Noted  . Drug overdose 07/22/2017  . Overdose 07/22/2017  . Intentional acetaminophen overdose ([place])   . Metabolic acidosis   . Pancreatitis, acute 06/30/2017  . Acute pancreatitis 06/30/2017  . DUB (dysfunctional uterine bleeding) 11/22/2016  . Hyperprolactinemia ([place]) 08/20/2016  . Contraceptive management 08/09/2016  . Tachycardia 01/13/2016  . 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  . Seizure disorder ([place]) 08/08/2015  . Chronic migraine 07/27/2015  . Asthma 04/15/2015  . Schizoaffective disorder, bipolar type ([place]) 03/10/2014  . Borderline personality disorder ([place]) 03/10/2014  . PTSD (post-traumatic stress disorder) 03/10/2014  . Autism spectrum disorder 06/15/2013    Past Surgical History:  Procedure Laterality Date  . MOUTH SURGERY  2009 or 2010     OB History    Gravida  2   Para  1   Term  1   Preterm      AB  1   Living  1     SAB  1   TAB      Ectopic       Multiple  0   Live Births  1            Home Medications    Prior to Admission medications   Medication Sig Start Date End Date Taking? Authorizing Provider  albuterol (PROVENTIL HFA;VENTOLIN HFA) 108 (90 Base) MCG/ACT inhaler Inhale 1-2 puffs into the lungs every 6 (six) hours as needed for wheezing or shortness of breath.   Yes [provider]  fluticasone-salmeterol (ADVAIR HFA) 115-21 MCG/ACT inhaler Inhale 2 puffs into the lungs 2 (two) times daily. 08/03/15  Yes Timmothy Euler, MD  levETIRAcetam (KEPPRA) 500 MG tablet Take 1 tablet (500 mg total) by mouth 2 (two) times daily. 07/30/17  Yes Clapacs, Madie [place], MD  prazosin (MINIPRESS) 2 MG capsule Take 1 capsule (2 mg total) by mouth 2 (two) times daily. 07/30/17  Yes Clapacs, Madie Reno, MD  risperiDONE (RISPERDAL) 2 MG tablet Take 1 tablet (2 mg total) by mouth 3 (three) times daily. 07/30/17  Yes Clapacs, Madie Reno, MD  sertraline (ZOLOFT) 100 MG tablet Take 1.5 tablets (150 mg total) by mouth daily. 07/31/17  Yes Clapacs, Madie Reno, MD  temazepam (RESTORIL) 30 MG capsule Take 1 capsule (30 mg total) by mouth at bedtime. 07/30/17  Yes Clapacs, Madie Reno, MD  topiramate (TOPAMAX) 100  MG tablet Take 1 tablet (100 mg total) by mouth at bedtime. 07/30/17  Yes Clapacs, Madie Reno, MD    Family History Family History  Problem Relation Age of Onset  . Mental illness Father   . Asthma Father   . PDD Brother   . Seizures Brother     Social History Social History   Tobacco Use  . Smoking status: Former Smoker    Packs/day: 0.00  . Smokeless tobacco: Never Used  . Tobacco comment: Smoked for 2  years age [age]  Substance Use Topics  . Alcohol use: No    Alcohol/week: 0.6 oz    Types: 1 Standard drinks or equivalent per week  . Drug use: No    Comment: History of cocaine use at age 64 for 4 months     Allergies   Bee venom; Coconut flavor; Geodon [ziprasidone hcl]; Haloperidol and related; Lithium; Oxycodone; Seroquel  [quetiapine fumarate]; Shellfish allergy; Phenergan [promethazine hcl]; Prilosec [omeprazole]; Sulfa antibiotics; and Tegretol [carbamazepine]   Review of Systems Review of Systems  Constitutional: Negative for chills and fever.  HENT: Negative.   Respiratory: Negative.   Cardiovascular: Negative.   Gastrointestinal: Negative.   Musculoskeletal: Negative.   Skin: Negative.   Neurological: Negative.   Psychiatric/Behavioral: Positive for suicidal ideas. Negative for hallucinations.     Physical Exam Updated Vital Signs BP 111/65 (BP Location: Right Arm)   Pulse (!) 124   Temp 99.2 F (37.3 C) (Oral)   Resp 14   LMP 04/05/2017   SpO2 100%   Physical Exam  Constitutional: She appears well-developed and well-nourished.  HENT:  Head: Normocephalic.  Neck: Normal range of motion. Neck supple.  Cardiovascular: Normal rate and regular rhythm.  Pulmonary/Chest: Effort normal and breath sounds normal.  Abdominal: Soft. Bowel sounds are normal. There is no tenderness. There is no rebound and no guarding.  Musculoskeletal: Normal range of motion.  Neurological: She is alert. No cranial nerve deficit.  Skin: Skin is warm and dry. No rash noted.  Psychiatric: Her speech is normal. Her affect is blunt. She is withdrawn. She is not actively hallucinating. She expresses suicidal ideation.     ED Treatments / Results  Labs (all labs ordered are listed, but only abnormal results are displayed) Labs Reviewed  ACETAMINOPHEN LEVEL - Abnormal; Notable for the following components:      Result Value   Acetaminophen (Tylenol), Serum <10 (*)    All other components within normal limits  RAPID URINE DRUG SCREEN, HOSP PERFORMED - Abnormal; Notable for the following components:   Benzodiazepines POSITIVE (*)    Barbiturates   (*)    Value: Result not available. Reagent lot number recalled by manufacturer.   All other components within normal limits  COMPREHENSIVE METABOLIC PANEL  ETHANOL    SALICYLATE LEVEL  CBC  I-STAT BETA HCG BLOOD, ED ([facility], [facility], [facility] ONLY)    EKG None  Radiology No results found.  Procedures Procedures (including critical care time)  Medications Ordered in ED Medications - No data to display   Initial Impression / Assessment and Plan / ED Course  I have reviewed the triage vital signs and the nursing notes.  Pertinent labs & imaging results that were available during my care of the patient were reviewed by me and considered in my medical decision making (see chart for details).     Patient presents for help with SI, no self harm prior to arrival. Suicide attempt 2 weeks ago. She is here voluntarily  and is considered medical cleared for psych eval.   During her clearance, she reports "I talked to my dad and it was a positive conversation and I don't want to hurt myself anymore and want to go home". Discussed that TTS consultation would be necessary to determine whether she was safe for discharge home. She agreed to stay voluntarily for that assessment. No IVC petition started at this time.  Final Clinical Impressions(s) / ED Diagnoses   Final diagnoses:  None   1. Suicidal ideation  ED Discharge Orders    None       Charlann Lange, Hershal Coria 08/05/17 2259    Charlesetta Shanks, MD 08/07/17 1505

## 2017-08-06 ENCOUNTER — Other Ambulatory Visit: Payer: Self-pay

## 2017-08-06 ENCOUNTER — Encounter (HOSPITAL_COMMUNITY): Payer: Self-pay | Admitting: *Deleted

## 2017-08-06 DIAGNOSIS — F250 Schizoaffective disorder, bipolar type: Secondary | ICD-10-CM | POA: Diagnosis not present

## 2017-08-06 MED ORDER — RISPERIDONE 1 MG PO TABS
2.0000 mg | ORAL_TABLET | Freq: Three times a day (TID) | ORAL | Status: DC
  Administered 2017-08-06 (×2): 2 mg via ORAL
  Filled 2017-08-06 (×2): qty 2

## 2017-08-06 MED ORDER — PRAZOSIN HCL 2 MG PO CAPS
2.0000 mg | ORAL_CAPSULE | Freq: Two times a day (BID) | ORAL | Status: DC
  Administered 2017-08-06: 2 mg via ORAL
  Filled 2017-08-06 (×2): qty 1

## 2017-08-06 MED ORDER — SERTRALINE HCL 50 MG PO TABS
150.0000 mg | ORAL_TABLET | Freq: Every day | ORAL | Status: DC
  Administered 2017-08-06: 150 mg via ORAL
  Filled 2017-08-06: qty 1

## 2017-08-06 MED ORDER — LEVETIRACETAM 500 MG PO TABS
500.0000 mg | ORAL_TABLET | Freq: Two times a day (BID) | ORAL | Status: DC
  Administered 2017-08-06: 500 mg via ORAL
  Filled 2017-08-06 (×2): qty 1

## 2017-08-06 MED ORDER — TEMAZEPAM 15 MG PO CAPS
30.0000 mg | ORAL_CAPSULE | Freq: Every day | ORAL | Status: DC
  Administered 2017-08-06: 30 mg via ORAL
  Filled 2017-08-06: qty 2

## 2017-08-06 MED ORDER — LORAZEPAM 1 MG PO TABS
1.0000 mg | ORAL_TABLET | Freq: Once | ORAL | Status: AC
  Administered 2017-08-06: 1 mg via ORAL
  Filled 2017-08-06 (×2): qty 1

## 2017-08-06 MED ORDER — TOPIRAMATE 25 MG PO TABS
100.0000 mg | ORAL_TABLET | Freq: Every day | ORAL | Status: DC
  Administered 2017-08-06: 100 mg via ORAL
  Filled 2017-08-06: qty 4

## 2017-08-06 NOTE — ED Notes (Signed)
Offered pt to shower. Snack given as requested.

## 2017-08-06 NOTE — ED Notes (Signed)
Pt ambulatory to nurses' desk c/o abd pain and nausea. Pt returned to room - sat on bed and is watching tv. Lunch tray delivered to pt shortly after c/o - pt eating w/o difficulty.

## 2017-08-06 NOTE — Progress Notes (Signed)
Patient just left [facility] on 6/26 and has an ACT team that needs to be called so they can handle things.  Waylan Boga, PMHNP

## 2017-08-06 NOTE — ED Notes (Addendum)
Pt approached this tech asking if she was "going to get a bed in the hospital anytime soon". Informed Pt this tech would ask nurse but did not see information supporting this. Pt went back to her room asking for "fucking water" and asking "where's my fucking nurse". Pt is not yelling, but speaking and pacing around room. RN aware.

## 2017-08-06 NOTE — ED Notes (Signed)
RN spoke w/pt re: behavior. States she is frustrated d/t she wants to go to [facility]. Pt stated "I'm going to hit something". RN encouraged pt to display appropriate behavior. Pt voiced understanding. RN offered to administer Ativan as ordered d/t anxiety. Pt voiced agreement. Advised pt of process and that NP, [facility], will be performing Telepsych shortly. Telepsych being performed at this time.

## 2017-08-06 NOTE — Progress Notes (Signed)
Patient assessed by [name], NP.  Pt does not meet criteria for inpatient treatment but has been referred to: [facility] Outpatient Partial Hospitalization Program [address], [place] [address] [phone]  Pt has an appointment at [facility] on Wednesday, August 07, 2017 @1 :30 PM.  Information regarding this appointment has been included in her discharge.  [name]. Judi Cong, MSW, [place] Disposition Clinical Social Work [phone] (cell) [phone] (office)

## 2017-08-06 NOTE — ED Notes (Addendum)
Pt ambulatory to nurses' desk speaking in excessively low tone to RN. Stated "I'm having thoughts of wanting to hurt myself". RN asked pt to repeat x 2 d/t unable to hear what pt was saying. Pt then returned to room. Sitter remains w/pt.

## 2017-08-06 NOTE — ED Notes (Signed)
Pt in shower.  
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
## 2017-08-06 NOTE — ED Notes (Signed)
After pt showered, pt given mesh underwear. Pt gave personal underwear to staff - placed in labeled belongings bag then placed in labeled belongings bag in pt's locker.

## 2017-08-06 NOTE — ED Provider Notes (Signed)
Patient cleared by behavioral health for discharge home.  Follow-up is been arranged by behavioral health.   Fredia Sorrow, MD 08/06/17 [phone]

## 2017-08-06 NOTE — ED Notes (Signed)
NP at bedside to explain to patient she will be here for overnight observation; IVC paperwork has been drawn up and on stand by if needed; patient agrees to stay volunteer at this time-Monique,RN

## 2017-08-06 NOTE — ED Notes (Signed)
Pt called her father from phone at nurses' desk to advise him of tx plan - Out referrals.

## 2017-08-06 NOTE — ED Notes (Signed)
Pt voices understanding and agreement w/tx plan - follow up Outpt w/[facility] - appt 08/07/17 at 1330. Pt states she will be taking a taxi home from ED.

## 2017-08-06 NOTE — ED Notes (Signed)
Pt states she lost custody of her child - states her spouse died 05/27/16 and her mother-in-law has custody of her child and is threatening to take her child away. States she lives alone. States she was medically admitted then psych admitted 2 wks ago d/t OD attempt. States she began experiencing SI thoughts yesterday so came to ED. States she feels like she needs inpt treatment. 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 States she does not want to go back to [facility] Also states she does not want to go to another facility as Voluntary d/t she does not have a way of getting back home and states she does not want to be IVC.

## 2017-08-06 NOTE — ED Notes (Addendum)
Light turned on in room as requested. Pt asking when she will speak w/dr. Durward Fortes pt TTS is performed every 24 hours. States she wants to remain confidential and only wants her father to know where she is. States her father does not have the Code to be able to call/visit w/her. Advised pt she will need to give him the Code when she speaks w/him. Voiced understanding. Pt placed call from nurses' desk then returned to room. Pt sitting on bed talking w/Sitter. Pt asking if she may use her personal items for showering - advised unable to do so - must use hospital-provided items. Voiced understanding.

## 2017-08-06 NOTE — Discharge Instructions (Addendum)
Not cleared by behavioral health for discharge home.  Follow-up as per behavioral health.

## 2017-08-06 NOTE — ED Notes (Signed)
Patient appeared in the doorway stating she needed someone to talk to; patient redirected back to bed-Monique,RN

## 2017-08-06 NOTE — ED Notes (Signed)
Pt given code (last 4 digits of CSN number) - "[id number]" d/t pt is listed as confidential. Pt leaving message for her father from phone at nurses' desk.

## 2017-08-06 NOTE — ED Notes (Addendum)
ALL belongings - 1 labeled belongings bag, 1 purse, and 1 large duffle bag - returned to pt - Pt signed verifying all items present. Pt voiced understanding of following up w/[facility] Outpt Appt - 08/07/17 @ 1330.

## 2017-08-06 NOTE — ED Notes (Signed)
Pt ambulatory to nurses' desk - stating in very low, soft voice - "I'm supposed to get my Risperdal 3 times a day. When am I getting it next?" Advised pt it is due to be given at 1600 - voiced understanding and then returned to room.

## 2017-08-07 ENCOUNTER — Telehealth (HOSPITAL_COMMUNITY): Payer: Self-pay | Admitting: Professional

## 2017-08-07 ENCOUNTER — Other Ambulatory Visit (HOSPITAL_COMMUNITY): Payer: PPO

## 2017-08-07 NOTE — Telephone Encounter (Signed)
After reading pt's chart and staffing with Lorin Glass, it was decided that pt is not appropriate for PHP. Pt is currently on an ACT team, requires Medicaid transport to get to appointments (which she would be unable to use due to we do not take Medicaid), and pt has intellectual disabilities.  Cln called pt to report she would not be eligible for PHP due to being on an ACT team. Cln encouraged pt to contact her ACT team for further services. Pt understood. Cln provided pt with information for [facility] (address and #) and encouraged pt to contact. Pt agreed. Pt appointment cancelled. Pt to f/u with ACT team and/or go to local emergency room if feeling unsafe.

## 2017-08-13 ENCOUNTER — Other Ambulatory Visit (HOSPITAL_COMMUNITY): Payer: PPO

## 2017-08-13 ENCOUNTER — Encounter: Payer: Self-pay | Admitting: Obstetrics and Gynecology

## 2017-08-14 ENCOUNTER — Encounter: Payer: Self-pay | Admitting: Neurology

## 2017-08-14 ENCOUNTER — Ambulatory Visit (INDEPENDENT_AMBULATORY_CARE_PROVIDER_SITE_OTHER): Payer: PPO | Admitting: Neurology

## 2017-08-14 VITALS — BP 90/68 | HR 96 | Ht 64.0 in | Wt 171.0 lb

## 2017-08-14 DIAGNOSIS — G40909 Epilepsy, unspecified, not intractable, without status epilepticus: Secondary | ICD-10-CM | POA: Diagnosis not present

## 2017-08-14 DIAGNOSIS — G43009 Migraine without aura, not intractable, without status migrainosus: Secondary | ICD-10-CM

## 2017-08-14 NOTE — Progress Notes (Signed)
NEUROLOGY FOLLOW UP OFFICE NOTE  Shaela Boer xxxAyende [id number]  HISTORY OF PRESENT ILLNESS: [name] is a 27 year old right-handed female with schizoaffective disorder, migraine and asthma who presents for migraines and seizure disorder.  She is accompanied by her mother-in-law who supplements history.   MIGRAINES: Update: She is doing well.  No longer with daily diffuse headache. Intensity:  Moderate to severe Duration:  1 hour not requiring treatment Frequency:  Once a month Current NSAIDS:  no Current analgesics:  no Current triptans:  no Current anti-emetic:  no Current muscle relaxants:  no Current anti-anxiolytic:  no Current sleep aide:  no Current Antihypertensive medications:  no Current Antidepressant medications:  sertraline 100mg  Current Anticonvulsant medications:  topiramate 100mg , Keppra 500mg  twice daily Current Vitamins/Herbal/Supplements:  prenatal Current Antihistamines/Decongestants:  no Other therapy:  no Birth Control:  no Other medication:  risperidone 2mg    Depression/anxiety:  no Sleep hygiene:  Good Exercise:  No  Hydration:  yes   History: Onset:  teenager Location:  varies Quality:  pounding Intensity:  10/10 Aura:  no Prodrome:  no Postdrome:  no Associated symptoms:  Photophobia, phonophobia.  Sometimes nausea.  She has not had any new worse headache of her life, waking up from sleep Duration:  All day Frequency:  3 to 5 days per week Frequency of abortive medication: 3 to 5 days per week Triggers/exacerbating factors:  no Relieving factors:  no Activity:  Cannot function.   Past NSAIDS:  ibuprofen Past analgesics:  no Past abortive triptans:  no Past muscle relaxants:  no Past anti-emetic:  promethazine 25mg  Past antihypertensive medications:  metoprolol Past antidepressant medications:  unknown Past anticonvulsant medications:  lamotrigine 50mg  Past vitamins/Herbal/Supplements:  no Other past therapies:  no   SEIZURE DISORDER: Update: Last visit, Keppra was decreased to 500mg  twice daily. Current antiepileptic medication:  Keppra 500mg  twice daily, topiramate 100mg  at bedtime   History: She had a concussion on 04/15/15 after falling out of bed and hitting her head on the floor.  She reports loss of consciousness for about a minute.  She had nausea and vomiting and went to the ED where CT of head was unremarkable.  She soon developed new onset seizures.  They have been unwitnessed.  She loses consciousness for less than a minute.  She does not bite her tongue or have incontinence.  They were occurring about 2 to 3 times a month.  She initially was on Depakote, which was subsequently changed to [place] when she became pregnant.  She has responded well to [place].  Her last seizure was in December 2017.  After giving birth in February, she had steadily been decreasing her dose of Keppra under the supervision of her previous neurologist (from 750mg  three times daily to 1000mg  twice daily to 750mg  twice daily).    She has no previous history of seizures.  She has history of repeated closed head trauma.  There is no known family history of seizures.   MRI of brain without contrast from 06/01/15 was personally reviewed and was normal. EEG from 07/21/15 was normal.   Past antiepileptic medication:  Depakote ER 1000mg  daily (helpful but discontinued due to pregnancy)   PSYCHIATRIC HISTORY:  She has an extensive psychiatric history.  She carries a diagnosis of schizoaffective disorder, Bipolar disorder, schizophrenia and borderline personality disorder.  She has history of prior suicide attempts.  She is [facility] at [facility].  She is a ward of the state and was unable to keep  her baby.  Her husband had passed away and she is under the care of her mother-in-law.  Her mother-in-law would like more extensive testing to narrow down and determine an accurate psychiatric diagnosis.  Of note, she has been noted to have  elevated prolactin levels.  Her GYN ordered an MRI of the brain which has not been performed.  She has upcoming appointment with him to discuss further.  PAST MEDICAL HISTORY: Past Medical History:  Diagnosis Date  . Acid reflux   . Asthma    last attack 03/13/15 or 03/14/15  . Autism   . 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 Chronic constipation   . Depression   . Drug-seeking behavior   . Essential tremor   . Headache   . Overdose of acetaminophen 07/2017   and other meds  . Personality disorder ([place])   . Schizo-affective psychosis ([place])   . Schizoaffective disorder, bipolar type ([place])   . Seizures [facility])    Last seizure December 2017    MEDICATIONS: Current Outpatient Medications on File Prior to Visit  Medication Sig Dispense Refill  . albuterol (PROVENTIL HFA;VENTOLIN HFA) 108 (90 Base) MCG/ACT inhaler Inhale 1-2 puffs into the lungs every 6 (six) hours as needed for wheezing or shortness of breath.    . fluticasone-salmeterol (ADVAIR HFA) 115-21 MCG/ACT inhaler Inhale 2 puffs into the lungs 2 (two) times daily. 1 Inhaler 5  . levETIRAcetam (KEPPRA) 500 MG tablet Take 1 tablet (500 mg total) by mouth 2 (two) times daily. 60 tablet 1  . prazosin (MINIPRESS) 2 MG capsule Take 1 capsule (2 mg total) by mouth 2 (two) times daily. 60 capsule 1  . risperiDONE (RISPERDAL) 2 MG tablet Take 1 tablet (2 mg total) by mouth 3 (three) times daily. 90 tablet 1  . sertraline (ZOLOFT) 100 MG tablet Take 1.5 tablets (150 mg total) by mouth daily. 45 tablet 1  . temazepam (RESTORIL) 30 MG capsule Take 1 capsule (30 mg total) by mouth at bedtime. 30 capsule 1  . topiramate (TOPAMAX) 100 MG tablet Take 1 tablet (100 mg total) by mouth at bedtime. 30 tablet 1   No current facility-administered medications on file prior to visit.     ALLERGIES: Allergies  Allergen Reactions  . Bee Venom Anaphylaxis  . Coconut Flavor Anaphylaxis and Rash  . Geodon [Ziprasidone Hcl] Other (See Comments)    Pt states  that this medication causes paralysis of the mouth.    . Haloperidol And Related Other (See Comments)    Pt states that this medication causes paralysis of the mouth.    . Lithium Other (See Comments)    Reaction:  Seizure-like activity.    . Oxycodone Other (See Comments)    Reaction:  Hallucinations   . Seroquel [Quetiapine Fumarate] Other (See Comments)    Pt states that this medication is too strong.   . Shellfish Allergy Anaphylaxis  . Phenergan [Promethazine Hcl] Other (See Comments)    Reaction:  Chest pain    . Prilosec [Omeprazole] Nausea And Vomiting  . Sulfa Antibiotics Other (See Comments)    Chest pain   . Tegretol [Carbamazepine] Nausea And Vomiting    FAMILY HISTORY: Family History  Problem Relation Age of Onset  . Mental illness Father   . Asthma Father   . PDD Brother   . Seizures Brother     SOCIAL HISTORY: Social History   Socioeconomic History  . Marital status: Widowed    Spouse name: Not  on file  . Number of children: 0  . Years of education: Not on file  . Highest education level: Not on file  Occupational History  . Occupation: disability  Social Needs  . Financial resource strain: Not on file  . Food insecurity:    Worry: Not on file    Inability: Not on file  . Transportation needs:    Medical: Not on file    Non-medical: Not on file  Tobacco Use  . Smoking status: Former Smoker    Packs/day: 0.00  . Smokeless tobacco: Never Used  . Tobacco comment: Smoked for 2  years age [age]  Substance and Sexual Activity  . Alcohol use: No    Alcohol/week: 0.6 oz    Types: 1 Standard drinks or equivalent per week  . Drug use: No    Comment: History of cocaine use at age 38 for 4 months  . Sexual activity: Not Currently    Birth control/protection: None  Lifestyle  . Physical activity:    Days per week: Not on file    Minutes per session: Not on file  . Stress: Not on file  Relationships  . Social connections:    Talks on phone: Not on  file    Gets together: Not on file    Attends religious service: Not on file    Active member of club or organization: Not on file    Attends meetings of clubs or organizations: Not on file    Relationship status: Not on file  . Intimate partner violence:    Fear of current or ex partner: Not on file    Emotionally abused: Not on file    Physically abused: Not on file    Forced sexual activity: Not on file  Other Topics Concern  . Not on file  Social History Narrative   Marital status: married; guardian is Furniture conservator/restorer      Children: none      Lives: with husband in apartment      Employment:  Disability      Tobacco: quit smoking; smoked for two years.      Alcohol ;none      Drugs: none   Has not traveled outside of the country.         REVIEW OF SYSTEMS: Constitutional: No fevers, chills, or sweats, no generalized fatigue, change in appetite Eyes: No visual changes, double vision, eye pain Ear, nose and throat: No hearing loss, ear pain, nasal congestion, sore throat Cardiovascular: No chest pain, palpitations Respiratory:  No shortness of breath at rest or with exertion, wheezes GastrointestinaI: No nausea, vomiting, diarrhea, abdominal pain, fecal incontinence Genitourinary:  No dysuria, urinary retention or frequency Musculoskeletal:  No neck pain, back pain Integumentary: No rash, pruritus, skin lesions Neurological: as above Psychiatric: no Endocrine: No palpitations, fatigue, diaphoresis, mood swings, change in appetite, change in weight, increased thirst Hematologic/Lymphatic:  No purpura, petechiae. Allergic/Immunologic: no itchy/runny eyes, nasal congestion, recent allergic reactions, rashes  PHYSICAL EXAM: Vitals:   08/14/17 1534  BP: 90/68  Pulse: 96  SpO2: 98%   General: No acute distress.  Patient appears well-groomed.   Head:  Normocephalic/atraumatic Eyes:  Fundi examined but not visualized Neck: supple, no paraspinal tenderness, full range of  motion Heart:  Regular rate and rhythm Lungs:  Clear to auscultation bilaterally Back: No paraspinal tenderness Neurological Exam: alert and oriented to person, place, and time. Attention span and concentration intact, recent and remote memory intact, fund of knowledge  intact.  Speech fluent and not dysarthric, language intact.  CN II-XII intact. Bulk and tone normal, muscle strength 5/5 throughout.  Sensation to light touch  intact.  Deep tendon reflexes 2+ throughout.  Finger to nose testing intact.  Gait normal, Romberg negative.  IMPRESSION: Migraine without aura, without status migrainosus, not intractable Seizure disorder  PLAN: 1.  Continue topiramate 100mg  at bedtime and Keppra 500mg  twice daily 2.  Limit pain relievers to no more than 2 days out of week to prevent rebound headache 3.  Headache diary 4.  Follow up in 9 months  Metta Clines, DO  CC: Kathyrn Lass, MD

## 2017-08-14 NOTE — Patient Instructions (Addendum)
1.  Continue topiramate (Topamax) 100mg  at bedtime and levetiracetam (Keppra) 500mg  twice daily 2.  Follow up with Dr. Mariane Masters 3.  Follow up with me in 9 months

## 2017-08-16 ENCOUNTER — Encounter (HOSPITAL_COMMUNITY): Payer: Self-pay | Admitting: Emergency Medicine

## 2017-08-16 ENCOUNTER — Emergency Department (HOSPITAL_COMMUNITY)
Admission: EM | Admit: 2017-08-16 | Discharge: 2017-08-17 | Disposition: A | Discharge status: Home / Self Care | Payer: PPO | Attending: Emergency Medicine | Admitting: Emergency Medicine

## 2017-08-16 ENCOUNTER — Other Ambulatory Visit: Payer: Self-pay

## 2017-08-16 DIAGNOSIS — J45909 Unspecified asthma, uncomplicated: Secondary | ICD-10-CM | POA: Diagnosis not present

## 2017-08-16 DIAGNOSIS — Z7289 Other problems related to lifestyle: Secondary | ICD-10-CM | POA: Diagnosis not present

## 2017-08-16 DIAGNOSIS — Z87891 Personal history of nicotine dependence: Secondary | ICD-10-CM | POA: Diagnosis not present

## 2017-08-16 DIAGNOSIS — Z79899 Other long term (current) drug therapy: Secondary | ICD-10-CM | POA: Insufficient documentation

## 2017-08-16 DIAGNOSIS — F840 Autistic disorder: Secondary | ICD-10-CM | POA: Insufficient documentation

## 2017-08-16 DIAGNOSIS — F329 Major depressive disorder, single episode, unspecified: Secondary | ICD-10-CM | POA: Insufficient documentation

## 2017-08-16 DIAGNOSIS — R45851 Suicidal ideations: Secondary | ICD-10-CM | POA: Diagnosis not present

## 2017-08-16 DIAGNOSIS — Z046 Encounter for general psychiatric examination, requested by authority: Secondary | ICD-10-CM | POA: Insufficient documentation

## 2017-08-16 NOTE — ED Notes (Signed)
Pt changed into burgundy scrubs.  Staffing office notified of [facility] sitter need.  Belongings placed in locker by Shirlean Mylar, EMT.  Charge RN aware of pt.  Security at triage to wand pt.

## 2017-08-16 NOTE — ED Triage Notes (Signed)
Pt to ED via [employer].  Reports suicidal ideation with plan to overdose on pills.  Pt cooperative.

## 2017-08-16 NOTE — ED Notes (Signed)
Belongs in locker 5 in pod F

## 2017-08-17 ENCOUNTER — Encounter (HOSPITAL_COMMUNITY): Payer: Self-pay | Admitting: Registered Nurse

## 2017-08-17 ENCOUNTER — Other Ambulatory Visit: Payer: Self-pay

## 2017-08-17 DIAGNOSIS — F329 Major depressive disorder, single episode, unspecified: Secondary | ICD-10-CM | POA: Diagnosis not present

## 2017-08-17 LAB — COMPREHENSIVE METABOLIC PANEL
ALK PHOS: 75 U/L (ref 38–126)
ALT: 12 U/L (ref 0–44)
ANION GAP: 6 (ref 5–15)
AST: 18 U/L (ref 15–41)
Albumin: 4 g/dL (ref 3.5–5.0)
BUN: 9 mg/dL (ref 6–20)
CALCIUM: 9.3 mg/dL (ref 8.9–10.3)
CO2: 22 mmol/L (ref 22–32)
Chloride: 113 mmol/L — ABNORMAL HIGH (ref 98–111)
Creatinine, Ser: 0.78 mg/dL (ref 0.44–1.00)
GFR calc Af Amer: >60 mL/min (ref >60)
GFR calc non Af Amer: >60 mL/min (ref >60)
Glucose, Bld: 95 mg/dL (ref 70–99)
POTASSIUM: 3.5 mmol/L (ref 3.5–5.1)
SODIUM: 141 mmol/L (ref 135–145)
Total Bilirubin: 0.5 mg/dL (ref 0.3–1.2)
Total Protein: 6.6 g/dL (ref 6.5–8.1)

## 2017-08-17 LAB — ACETAMINOPHEN LEVEL

## 2017-08-17 LAB — CBC
HCT: 39.5 % (ref 36.0–46.0)
Hemoglobin: 12.5 g/dL (ref 12.0–15.0)
MCH: 27.8 pg (ref 26.0–34.0)
MCHC: 31.6 g/dL (ref 30.0–36.0)
MCV: 87.8 fL (ref 78.0–100.0)
Platelets: 192 10*3/uL (ref 150–400)
RBC: 4.5 MIL/uL (ref 3.87–5.11)
RDW: 12.7 % (ref 11.5–15.5)
WBC: 8.6 10*3/uL (ref 4.0–10.5)

## 2017-08-17 LAB — I-STAT BETA HCG BLOOD, ED (MC, WL, AP ONLY): I-stat hCG, quantitative: <5 m[IU]/mL (ref <5)

## 2017-08-17 LAB — RAPID URINE DRUG SCREEN, HOSP PERFORMED
Amphetamines: NOT DETECTED
Benzodiazepines: POSITIVE — AB
COCAINE: NOT DETECTED
Opiates: NOT DETECTED
Tetrahydrocannabinol: NOT DETECTED

## 2017-08-17 LAB — SALICYLATE LEVEL

## 2017-08-17 LAB — ETHANOL: Alcohol, Ethyl (B): <10 mg/dL (ref <10)

## 2017-08-17 MED ORDER — ALBUTEROL SULFATE HFA 108 (90 BASE) MCG/ACT IN AERS: 1.0000 | INHALATION_SPRAY | Freq: Four times a day (QID) | RESPIRATORY_TRACT | Status: DC | PRN

## 2017-08-17 MED ORDER — MOMETASONE FURO-FORMOTEROL FUM 200-5 MCG/ACT IN AERO: 2.0000 | INHALATION_SPRAY | Freq: Two times a day (BID) | RESPIRATORY_TRACT | Status: DC

## 2017-08-17 MED ORDER — TOPIRAMATE 25 MG PO TABS: 100.0000 mg | ORAL_TABLET | Freq: Every day | ORAL | Status: DC

## 2017-08-17 MED ORDER — LEVETIRACETAM 500 MG PO TABS
500.0000 mg | ORAL_TABLET | Freq: Two times a day (BID) | ORAL | Status: DC
  Administered 2017-08-17: 500 mg via ORAL
  Filled 2017-08-17: qty 1

## 2017-08-17 MED ORDER — PRAZOSIN HCL 2 MG PO CAPS
2.0000 mg | ORAL_CAPSULE | Freq: Two times a day (BID) | ORAL | Status: DC
  Administered 2017-08-17: 2 mg via ORAL
  Filled 2017-08-17: qty 1

## 2017-08-17 MED ORDER — SERTRALINE HCL 50 MG PO TABS
150.0000 mg | ORAL_TABLET | Freq: Every day | ORAL | Status: DC
  Administered 2017-08-17: 150 mg via ORAL
  Filled 2017-08-17: qty 1

## 2017-08-17 MED ORDER — HYDROXYZINE HCL 50 MG PO TABS: 50.0000 mg | ORAL_TABLET | Freq: Every day | ORAL | Status: DC

## 2017-08-17 MED ORDER — RISPERIDONE 1 MG PO TABS
2.0000 mg | ORAL_TABLET | Freq: Three times a day (TID) | ORAL | Status: DC
  Administered 2017-08-17: 2 mg via ORAL
  Filled 2017-08-17: qty 2

## 2017-08-17 NOTE — ED Notes (Signed)
ALL belongings - 1 labeled belongings bag, 1 black duffel bag, and 1 valuables envelope - returned to pt - Pt signed verifying all items present.

## 2017-08-17 NOTE — ED Notes (Signed)
Pt ambulatory to nurses' desk asking x 2 for her meds. Advised pt EDP has ordered them - waiting on pharmacy to verify. Pt asking "Are they my regular meds?" X 2 - advised yes. Pt returned to room as asked. Sitter remains w/pt.

## 2017-08-17 NOTE — Progress Notes (Signed)
LCSW contacted patient's father Carylon Perches [phone]) and he reports that patient does not have a legal guardian anymore. "She has been declared competent now." He reports that he will come and get her from the ER if her mother in law, Constance Holster, declines to pick her up.  Herbert Moors MSW, LCSW, LCAS Clinical Social Worker 08/17/2017 2:16 PM

## 2017-08-17 NOTE — Progress Notes (Signed)
Patient meets criteria for inpatient treatment. CSW faxed referrals to the following facilities for review.[facility], [facility], [facility], [facility], [place], [place], [place], [place], [place], [place], [place], [place], [place], [place], [place], [place], [place], [place], [place], [place], and [place].    TTS will continue to seek bed placement.     Herbert Moors, MSW, LCSW, LCAS 08/17/2017 8:41 AM

## 2017-08-17 NOTE — ED Notes (Signed)
Pt voiced understanding of Medical Clearance Pt Policy form - copy of signed form given. Pt made phone call from nurses' desk.

## 2017-08-17 NOTE — ED Notes (Signed)
TTS at this time. 

## 2017-08-17 NOTE — ED Notes (Signed)
Per Tamala Fothergill, [facility], pt's father advised pt is her own legal guardian now - Requested for pt to call her mother-in-law to pick her up - if unable, then father will come get her. Pt called her mother-in-law - pt stated she advised for her to call her back in 1 hour.
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
## 2017-08-17 NOTE — ED Provider Notes (Signed)
[place] EMERGENCY DEPARTMENT Provider Note   CSN: [id number] Arrival date & time: 08/16/17  2304     History   Chief Complaint Chief Complaint  Patient presents with  . Suicidal    HPI [name] is a 27 y.o. female.  Patient presents to the emergency department with a chief complaint of suicidal thoughts.  She states that she would like to overdose on medication.  She states that she is try to do this in the past.  She is unable to clarify why she is feeling depressed now.  She states that she also cuts herself for relief of her depression.  She denies any drug or alcohol use.  Denies any other associated symptoms.  The history is provided by the patient. No language interpreter was used.    Past Medical History:  Diagnosis Date  . Acid reflux   . Asthma    last attack 03/13/15 or 03/14/15  . Autism   . 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 Chronic constipation   . Depression   . Drug-seeking behavior   . Essential tremor   . Headache   . Overdose of acetaminophen 07/2017   and other meds  . Personality disorder ([place])   . Schizo-affective psychosis ([place])   . Schizoaffective disorder, bipolar type ([place])   . Seizures [facility])    Last seizure December 2017    Patient Active Problem List   Diagnosis Date Noted  . Drug overdose 07/22/2017  . Overdose 07/22/2017  . Intentional acetaminophen overdose ([place])   . Metabolic acidosis   . Pancreatitis, acute 06/30/2017  . Acute pancreatitis 06/30/2017  . DUB (dysfunctional uterine bleeding) 11/22/2016  . Hyperprolactinemia ([place]) 08/20/2016  . Contraceptive management 08/09/2016  . Tachycardia 01/13/2016  . 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  . Seizure disorder ([place]) 08/08/2015  . Chronic migraine 07/27/2015  . Asthma 04/15/2015  . Schizoaffective disorder, bipolar type ([place]) 03/10/2014  . Borderline personality disorder ([address]) 03/10/2014  . PTSD (post-traumatic stress disorder)  03/10/2014  . Autism spectrum disorder 06/15/2013    Past Surgical History:  Procedure Laterality Date  . MOUTH SURGERY  2009 or 2010     OB History    Gravida  2   Para  1   Term  1   Preterm      AB  1   Living  1     SAB  1   TAB      Ectopic      Multiple  0   Live Births  1            Home Medications    Prior to Admission medications   Medication Sig Start Date End Date Taking? Authorizing Provider  albuterol (PROVENTIL HFA;VENTOLIN HFA) 108 (90 Base) MCG/ACT inhaler Inhale 1-2 puffs into the lungs every 6 (six) hours as needed for wheezing or shortness of breath.   Yes [provider]  fluticasone-salmeterol (ADVAIR HFA) 115-21 MCG/ACT inhaler Inhale 2 puffs into the lungs 2 (two) times daily. 08/03/15  Yes Timmothy Euler, MD  hydrOXYzine (ATARAX/VISTARIL) 50 MG tablet Take 50 mg by mouth at bedtime. 08/15/17  Yes [provider]  levETIRAcetam (KEPPRA) 500 MG tablet Take 1 tablet (500 mg total) by mouth 2 (two) times daily. 07/30/17  Yes Clapacs, Madie Reno, MD  prazosin (MINIPRESS) 2 MG capsule Take 1 capsule (2 mg total) by mouth 2 (two) times daily. 07/30/17  Yes Clapacs, Madie Reno,  MD  risperiDONE (RISPERDAL) 2 MG tablet Take 1 tablet (2 mg total) by mouth 3 (three) times daily. 07/30/17  Yes Clapacs, Madie Reno, MD  sertraline (ZOLOFT) 100 MG tablet Take 1.5 tablets (150 mg total) by mouth daily. 07/31/17  Yes Clapacs, Madie Reno, MD  topiramate (TOPAMAX) 100 MG tablet Take 1 tablet (100 mg total) by mouth at bedtime. 07/30/17  Yes Clapacs, Madie Reno, MD  temazepam (RESTORIL) 30 MG capsule Take 1 capsule (30 mg total) by mouth at bedtime. Patient not taking: Reported on 08/17/2017 07/30/17   Clapacs, Madie Reno, MD    Family History Family History  Problem Relation Age of Onset  . Mental illness Father   . Asthma Father   . PDD Brother   . Seizures Brother     Social History Social History   Tobacco Use  . Smoking status: Former Smoker     Packs/day: 0.00  . Smokeless tobacco: Never Used  . Tobacco comment: Smoked for 2  years age [age]  Substance Use Topics  . Alcohol use: No    Alcohol/week: 0.6 oz    Types: 1 Standard drinks or equivalent per week  . Drug use: No    Comment: History of cocaine use at age 37 for 4 months     Allergies   Bee venom; Coconut flavor; Geodon [ziprasidone hcl]; Haloperidol and related; Lithium; Oxycodone; Seroquel [quetiapine fumarate]; Shellfish allergy; Phenergan [promethazine hcl]; Prilosec [omeprazole]; Sulfa antibiotics; and Tegretol [carbamazepine]   Review of Systems Review of Systems  All other systems reviewed and are negative.    Physical Exam Updated Vital Signs BP 102/66 (BP Location: Right Arm)   Pulse 99   Temp 98.6 F (37 C) (Oral)   Resp 18   SpO2 100%   Physical Exam  Constitutional: She is oriented to person, place, and time. She appears well-developed and well-nourished.  HENT:  Head: Normocephalic and atraumatic.  Eyes: Pupils are equal, round, and reactive to light. Conjunctivae and EOM are normal.  Neck: Normal range of motion. Neck supple.  Cardiovascular: Normal rate and regular rhythm. Exam reveals no gallop and no friction rub.  No murmur heard. Pulmonary/Chest: Effort normal and breath sounds normal. No respiratory distress. She has no wheezes. She has no rales. She exhibits no tenderness.  Abdominal: Soft. Bowel sounds are normal. She exhibits no distension and no mass. There is no tenderness. There is no rebound and no guarding.  Musculoskeletal: Normal range of motion. She exhibits no edema or tenderness.  Neurological: She is alert and oriented to person, place, and time.  Skin: Skin is warm and dry.  Psychiatric: She has a normal mood and affect. Her behavior is normal. Judgment and thought content normal.  Nursing note and vitals reviewed.    ED Treatments / Results  Labs (all labs ordered are listed, but only abnormal results are  displayed) Labs Reviewed  COMPREHENSIVE METABOLIC PANEL - Abnormal; Notable for the following components:      Result Value   Chloride 113 (*)    All other components within normal limits  ACETAMINOPHEN LEVEL - Abnormal; Notable for the following components:   Acetaminophen (Tylenol), Serum <10 (*)    All other components within normal limits  RAPID URINE DRUG SCREEN, HOSP PERFORMED - Abnormal; Notable for the following components:   Benzodiazepines POSITIVE (*)    Barbiturates   (*)    Value: Result not available. Reagent lot number recalled by manufacturer.   All other components within normal  limits  ETHANOL  SALICYLATE LEVEL  CBC  I-STAT BETA HCG BLOOD, ED ([facility], [facility], [facility] ONLY)  I-STAT BETA HCG BLOOD, ED ([facility], [facility], [facility] ONLY)    EKG None  Radiology No results found.  Procedures Procedures (including critical care time)  Medications Ordered in ED Medications - No data to display   Initial Impression / Assessment and Plan / ED Course  I have reviewed the triage vital signs and the nursing notes.  Pertinent labs & imaging results that were available during my care of the patient were reviewed by me and considered in my medical decision making (see chart for details).    Patient here for suicidal ideation.  History of the same.  Would like to overdose with pills.  TTS recommends inpatient treatment.  Patient is medically clear.   Final Clinical Impressions(s) / ED Diagnoses   Final diagnoses:  Suicidal ideation    ED Discharge Orders    None       Montine Circle, PA-C 08/17/17 0224    Jola Schmidt, MD 08/17/17 [phone]

## 2017-08-17 NOTE — ED Notes (Signed)
Pt spoke w/her mother-in-law on phone  - advised she will pick up pt rather than pt's father at 65.

## 2017-08-17 NOTE — ED Notes (Signed)
States she wants to cut herself and has SI plan - attempt to OD. States she contacted her ACT Team PTA and was advised to come to ED. Pt BIB [facility] - states "I want to come in voluntarily". States she is fearful she may hurt herself if she does not get help. Pt asking for Home Meds - advised pt RN is asking EDP to order.

## 2017-08-17 NOTE — ED Notes (Signed)
Attempting to reach [facility] Counselor to inquire if ACT has been or needs to be contacted.

## 2017-08-17 NOTE — Consult Note (Signed)
  Tele Assessment   [name], 27 y.o., female patient presented to [facility] with complaints suicidal ideation.  Patient seen via telepsych by this provider; chart reviewed and consulted with Dr. Dwyane Dee on 08/17/17.  On evaluation [name] reports "When I got home last night I was having suicidal thoughts.  I called my ACTT team and they told me to go to the hospital."  Patient states that she hates being home alone. Reports she has a 23 month old baby who is living with her mother-in-law who also has custody.  States that her husband has past away; and she hates to be at home alone at night and that is when her depression is worse.  Stats that her family, friends, and ACTT is supportive.  States that she see her daughter everyday and she is with her everyday only goes home down the street to sleep.  Patient feeling better today.  States that she has not talked to her therapist about how she is feeling.  States that she has a history of cutting and last time she cut was 2 years ago.  Patient denies suicidal ideation stating that she does not want to kill herself.  Patient also denies self-harm/homicidal ideation, psychosis, and paranoia.      During evaluation [name] is alert/oriented x 4; calm/cooperative; and mood congruent with affect.  She does not appear to be responding to internal/external stimuli or delusional thoughts.  Patient denied suicidal/self-harm/homicidal ideation, psychosis, and paranoia.  Patient answered question appropriately.  Patient presents well, and acknowledge and that her statement of suicidal was to get to hospital because she did not want to be home alone.  Denies suicidal ideation today.  Discussed patient multiple hospital visits and treatment options; she did not go to the scheduled PHP appointment that was scheduled for her during her last hospital visit.  Patient encouraged to speak with her mother-in-law to see if she could spend  the night 2-3 times a week since she stated that would help.  Patient also encouraged to talk with ACTT team about the way she is feeling and to speak with her therapist so they can come up with some coping skills.  Understanding voicedPatient psychiatrically cleared  Recommendations:  Follow up with ACTT team; and current outpatient psychiatric provider  Disposition: No evidence of imminent risk to self or others at present.   Patient does not meet criteria for psychiatric inpatient admission. Supportive therapy provided about ongoing stressors. Refer to IOP. Discussed crisis plan, support from social network, calling 911, coming to the Emergency Department, and calling Suicide Hotline.   Spoke with Dr. Lum Keas; informed of above recommendations/disposition  Evelyn [place], NP

## 2017-08-17 NOTE — ED Notes (Signed)
Pt ate 100% of lunch

## 2017-08-17 NOTE — ED Notes (Signed)
Pt ambulatory to nurses' desk asking if anyone was able to reach her mother-in-law to pick her up. Advised pt RN has not been advised of anything as of yet. States she can take a "LYFT" home if needed.

## 2017-08-17 NOTE — ED Notes (Signed)
Pt advised her father will be arriving to pick her up in an hour.

## 2017-08-18 DIAGNOSIS — R45851 Suicidal ideations: Secondary | ICD-10-CM | POA: Diagnosis not present

## 2017-08-18 DIAGNOSIS — F840 Autistic disorder: Secondary | ICD-10-CM | POA: Diagnosis not present

## 2017-08-18 DIAGNOSIS — J45909 Unspecified asthma, uncomplicated: Secondary | ICD-10-CM | POA: Diagnosis not present

## 2017-08-18 DIAGNOSIS — F250 Schizoaffective disorder, bipolar type: Secondary | ICD-10-CM | POA: Diagnosis not present

## 2017-08-18 DIAGNOSIS — F329 Major depressive disorder, single episode, unspecified: Secondary | ICD-10-CM | POA: Diagnosis present

## 2017-08-18 DIAGNOSIS — Z79899 Other long term (current) drug therapy: Secondary | ICD-10-CM | POA: Diagnosis not present

## 2017-08-18 DIAGNOSIS — Z87891 Personal history of nicotine dependence: Secondary | ICD-10-CM | POA: Diagnosis not present

## 2017-08-19 ENCOUNTER — Other Ambulatory Visit: Payer: Self-pay

## 2017-08-19 ENCOUNTER — Emergency Department (HOSPITAL_COMMUNITY)
Admission: EM | Admit: 2017-08-19 | Discharge: 2017-08-20 | Disposition: A | Discharge status: Home / Self Care | Payer: PPO | Attending: Emergency Medicine | Admitting: Emergency Medicine

## 2017-08-19 ENCOUNTER — Encounter (HOSPITAL_COMMUNITY): Payer: Self-pay | Admitting: Emergency Medicine

## 2017-08-19 DIAGNOSIS — R45851 Suicidal ideations: Secondary | ICD-10-CM | POA: Insufficient documentation

## 2017-08-19 DIAGNOSIS — F4310 Post-traumatic stress disorder, unspecified: Secondary | ICD-10-CM | POA: Diagnosis present

## 2017-08-19 DIAGNOSIS — Z87891 Personal history of nicotine dependence: Secondary | ICD-10-CM | POA: Insufficient documentation

## 2017-08-19 DIAGNOSIS — F4329 Adjustment disorder with other symptoms: Secondary | ICD-10-CM | POA: Diagnosis not present

## 2017-08-19 DIAGNOSIS — F840 Autistic disorder: Secondary | ICD-10-CM | POA: Insufficient documentation

## 2017-08-19 DIAGNOSIS — Z79899 Other long term (current) drug therapy: Secondary | ICD-10-CM | POA: Insufficient documentation

## 2017-08-19 DIAGNOSIS — J45909 Unspecified asthma, uncomplicated: Secondary | ICD-10-CM | POA: Insufficient documentation

## 2017-08-19 DIAGNOSIS — F250 Schizoaffective disorder, bipolar type: Secondary | ICD-10-CM | POA: Insufficient documentation

## 2017-08-19 LAB — COMPREHENSIVE METABOLIC PANEL
ALBUMIN: 4 g/dL (ref 3.5–5.0)
ALT: 14 U/L (ref 0–44)
ANION GAP: 6 (ref 5–15)
AST: 18 U/L (ref 15–41)
Alkaline Phosphatase: 65 U/L (ref 38–126)
BUN: 12 mg/dL (ref 6–20)
CHLORIDE: 114 mmol/L — AB (ref 98–111)
CO2: 21 mmol/L — AB (ref 22–32)
Calcium: 9 mg/dL (ref 8.9–10.3)
Creatinine, Ser: 0.66 mg/dL (ref 0.44–1.00)
GFR calc Af Amer: >60 mL/min (ref >60)
GFR calc non Af Amer: >60 mL/min (ref >60)
Glucose, Bld: 95 mg/dL (ref 70–99)
POTASSIUM: 3.6 mmol/L (ref 3.5–5.1)
SODIUM: 141 mmol/L (ref 135–145)
Total Bilirubin: 0.3 mg/dL (ref 0.3–1.2)
Total Protein: 6.7 g/dL (ref 6.5–8.1)

## 2017-08-19 LAB — CBC
HCT: 39.4 % (ref 36.0–46.0)
Hemoglobin: 12.7 g/dL (ref 12.0–15.0)
MCH: 28.3 pg (ref 26.0–34.0)
MCHC: 32.2 g/dL (ref 30.0–36.0)
MCV: 87.9 fL (ref 78.0–100.0)
PLATELETS: 185 10*3/uL (ref 150–400)
RBC: 4.48 MIL/uL (ref 3.87–5.11)
RDW: 12.9 % (ref 11.5–15.5)
WBC: 8.2 10*3/uL (ref 4.0–10.5)

## 2017-08-19 LAB — RAPID URINE DRUG SCREEN, HOSP PERFORMED
AMPHETAMINES: NOT DETECTED
Benzodiazepines: POSITIVE — AB
Cocaine: NOT DETECTED
OPIATES: NOT DETECTED
TETRAHYDROCANNABINOL: NOT DETECTED

## 2017-08-19 LAB — ACETAMINOPHEN LEVEL

## 2017-08-19 LAB — I-STAT BETA HCG BLOOD, ED (MC, WL, AP ONLY): I-stat hCG, quantitative: <5 m[IU]/mL (ref <5)

## 2017-08-19 LAB — SALICYLATE LEVEL

## 2017-08-19 LAB — ETHANOL: Alcohol, Ethyl (B): <10 mg/dL (ref <10)

## 2017-08-19 MED ORDER — MOMETASONE FURO-FORMOTEROL FUM 200-5 MCG/ACT IN AERO
2.0000 | INHALATION_SPRAY | Freq: Two times a day (BID) | RESPIRATORY_TRACT | Status: DC
  Administered 2017-08-19 – 2017-08-20 (×2): 2 via RESPIRATORY_TRACT
  Filled 2017-08-19: qty 8.8

## 2017-08-19 MED ORDER — SERTRALINE HCL 50 MG PO TABS
150.0000 mg | ORAL_TABLET | Freq: Every day | ORAL | Status: DC
  Administered 2017-08-19 – 2017-08-20 (×2): 150 mg via ORAL
  Filled 2017-08-19 (×2): qty 3

## 2017-08-19 MED ORDER — PRAZOSIN HCL 1 MG PO CAPS
2.0000 mg | ORAL_CAPSULE | Freq: Two times a day (BID) | ORAL | Status: DC
  Administered 2017-08-19 – 2017-08-20 (×3): 2 mg via ORAL
  Filled 2017-08-19 (×3): qty 2

## 2017-08-19 MED ORDER — ALBUTEROL SULFATE HFA 108 (90 BASE) MCG/ACT IN AERS: 1.0000 | INHALATION_SPRAY | Freq: Four times a day (QID) | RESPIRATORY_TRACT | Status: DC | PRN

## 2017-08-19 MED ORDER — LEVETIRACETAM 500 MG PO TABS
500.0000 mg | ORAL_TABLET | Freq: Two times a day (BID) | ORAL | Status: DC
  Administered 2017-08-19 – 2017-08-20 (×3): 500 mg via ORAL
  Filled 2017-08-19 (×3): qty 1

## 2017-08-19 MED ORDER — TOPIRAMATE 100 MG PO TABS
100.0000 mg | ORAL_TABLET | Freq: Every day | ORAL | Status: DC
  Administered 2017-08-19: 100 mg via ORAL
  Filled 2017-08-19: qty 1

## 2017-08-19 MED ORDER — RISPERIDONE 2 MG PO TABS
2.0000 mg | ORAL_TABLET | Freq: Three times a day (TID) | ORAL | Status: DC
  Administered 2017-08-19 – 2017-08-20 (×3): 2 mg via ORAL
  Filled 2017-08-19 (×3): qty 1

## 2017-08-19 MED ORDER — HYDROXYZINE HCL 25 MG PO TABS
50.0000 mg | ORAL_TABLET | Freq: Every day | ORAL | Status: DC
  Administered 2017-08-19: 50 mg via ORAL
  Filled 2017-08-19: qty 2
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
## 2017-08-19 NOTE — ED Triage Notes (Signed)
Pt reports having suicidal ideation with plan of cutting self with razor for the last several hours.

## 2017-08-19 NOTE — ED Notes (Signed)
Bed: [facility] Expected date:  Expected time:  Means of arrival:  Comments: 

## 2017-08-19 NOTE — Progress Notes (Signed)
TTS consulted with Patriciaann Clan, PA who recommends inpt treatment. EDP Leaphart, Zack Seal, PA-C and pt's nurse have been advised of the disposition. TTS to seek placement.  Lind Covert, MSW, LCSW Therapeutic Triage Specialist  [phone]

## 2017-08-19 NOTE — ED Notes (Signed)
ED Provider at bedside. 
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
## 2017-08-19 NOTE — ED Notes (Signed)
Pt is very childlike.  She has been sleeping most of the morning but got up to tell us she started scratching her left arm.  Arm is red with no broken skin.  Pt mumbles and is very difficult to communicate with.  She denies S/I at this time.  15 minute checks and video monitoring in place.

## 2017-08-19 NOTE — Consult Note (Signed)
[place] [facility] Consult   Reason for Consult:  Suicidal ideation Referring Physician:  EDP Patient Identification: [name] MRN:  [id number] Principal Diagnosis: Adjustment disorder with mixed emotional features Diagnosis:   Patient Active Problem List   Diagnosis Date Noted  . Adjustment disorder with mixed emotional features [F43.29] 08/19/2017  . Drug overdose [T50.901A] 07/22/2017  . Overdose [T50.901A] 07/22/2017  . Intentional acetaminophen overdose ([place]) [T39.1X2A]   . Metabolic acidosis [F63.8]   . Pancreatitis, acute [K85.90] 06/30/2017  . Acute pancreatitis [K85.90] 06/30/2017  . DUB (dysfunctional uterine bleeding) [N93.8] 11/22/2016  . Hyperprolactinemia ([place]) [E22.1] 08/20/2016  . Contraceptive management [Z30.9] 08/09/2016  . Tachycardia [R00.0] 01/13/2016  . 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 [Z14.8] 09/08/2015  . Seizure disorder ([place]) [G66.599] 08/08/2015  . Chronic migraine [G43.709] 07/27/2015  . Asthma [J45.909] 04/15/2015  . Schizoaffective disorder, bipolar type ([place]) [F25.0] 03/10/2014  . Borderline personality disorder ([place]) [F60.3] 03/10/2014  . PTSD (post-traumatic stress disorder) [F43.10] 03/10/2014  . Autism spectrum disorder [F84.0] 06/15/2013    Total Time spent with patient: 30 minutes  Subjective:   [name] is a 27 y.o. female patient presented to [facility] voluntarily with complaints of suicidal ideation and a plan to cut her wrist  HPI:  27 y.o., female presented to ED voluntary with complaints of suicidal ideation.  Patients' home medication restarted; and patient monitored overnight for stabilized.  Today patient is alert/oriented, calm/cooperative.  Patient denies suicidal/homicidal ideation, thoughts of self injury, psychosis, and paranoia.  Patient reports that she hates staying home a lone.   Patient asked what it was it she needed to help her because she has had multiple visits to the ED but is denying  suicidal/self-harm/homicidal ideation, psychosis, and paranoia.   Patient asked if she spoke with her mother in law to see if she could stay some nights states she did not; asked if we could speak with mother in law she stated not. Patient stated that she was interested in group services and then stated that group does not help her and that she was denied related to having a ACTT team.  Patient states that her ACTT team is not assessable; then states that ACTT team visit her home twice weekly.  Patient states that she does not get to she her child often and then states that she sees her child everyday. Patient appears stable at this time.  Will speak with ACTT team [facility]) to see if can come by hospital to see patient.  If ACTT comfortable with taking patient home patient can be discharged.  If not monitor patient for another 24 hours; if no changes can discharge tomorrow morning.      Past Psychiatric History: Borderline personality disorder, PTSD, Schizoaffective disorder, bipolar type  Risk to Self: Suicidal Ideation: Yes-Currently Present Suicidal Intent: Yes-Currently Present Is patient at risk for suicide?: Yes Suicidal Plan?: Yes-Currently Present Specify Current Suicidal Plan: pt states she has a plan to cut her wrists  Access to Means: Yes Specify Access to Suicidal Means: pt has access to sharps  What has been your use of drugs/alcohol within the last 12 months?: denies use  How many times?: 5 Other Self Harm Risks: pt lives alone, feels isolated, hx of suicide attempts, hx of mental illness  Triggers for Past Attempts: Unpredictable, Unknown Intentional Self Injurious Behavior: Cutting Comment - Self Injurious Behavior: pt has hx of self harm by cutting with various objects  Risk to Others: Homicidal Ideation: No Thoughts  of Harm to Others: No Current Homicidal Intent: No Current Homicidal Plan: No Access to Homicidal Means: No History of harm to others?: No Assessment of  Violence: None Noted Does patient have access to weapons?: No Criminal Charges Pending?: No Does patient have a court date: No Prior Inpatient Therapy: Prior Inpatient Therapy: Yes Prior Therapy Dates: June 2019 Prior Therapy Facilty/Provider(s): [facility] Reason for Treatment: SI Prior Outpatient Therapy: Prior Outpatient Therapy: Yes Prior Therapy Dates: Current Prior Therapy Facilty/Provider(s): [facility] Reason for Treatment: Schizoaffective disorder Does patient have an ACCT team?: Yes Does patient have Intensive In-House Services?  : No Does patient have [facility] services? : Yes Does patient have P4CC services?: No  Past Medical History:  Past Medical History:  Diagnosis Date  . Acid reflux   . Asthma    last attack 03/13/15 or 03/14/15  . Autism   . 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 Chronic constipation   . Depression   . Drug-seeking behavior   . Essential tremor   . Headache   . Overdose of acetaminophen 07/2017   and other meds  . Personality disorder ([place])   . Schizo-affective psychosis ([place])   . Schizoaffective disorder, bipolar type ([place])   . Seizures [facility])    Last seizure December 2017    Past Surgical History:  Procedure Laterality Date  . MOUTH SURGERY  2009 or 2010   Family History:  Family History  Problem Relation Age of Onset  . Mental illness Father   . Asthma Father   . PDD Brother   . Seizures Brother    Family Psychiatric  History: See above list Social History:  Social History   Substance and Sexual Activity  Alcohol Use No  . Alcohol/week: 0.6 oz  . Types: 1 Standard drinks or equivalent per week     Social History   Substance and Sexual Activity  Drug Use No   Comment: History of cocaine use at age 61 for 4 months    Social History   Socioeconomic History  . Marital status: Widowed    Spouse name: Not on file  . Number of children: 0  . Years of education: Not on file  . Highest education level: Not on file  Occupational History   . Occupation: disability  Social Needs  . Financial resource strain: Not on file  . Food insecurity:    Worry: Not on file    Inability: Not on file  . Transportation needs:    Medical: Not on file    Non-medical: Not on file  Tobacco Use  . Smoking status: Former Smoker    Packs/day: 0.00  . Smokeless tobacco: Never Used  . Tobacco comment: Smoked for 2  years age [age]  Substance and Sexual Activity  . Alcohol use: No    Alcohol/week: 0.6 oz    Types: 1 Standard drinks or equivalent per week  . Drug use: No    Comment: History of cocaine use at age 63 for 4 months  . Sexual activity: Not Currently    Birth control/protection: None  Lifestyle  . Physical activity:    Days per week: Not on file    Minutes per session: Not on file  . Stress: Not on file  Relationships  . Social connections:    Talks on phone: Not on file    Gets together: Not on file    Attends religious service: Not on file    Active member of club or organization:  Not on file    Attends meetings of clubs or organizations: Not on file    Relationship status: Not on file  Other Topics Concern  . Not on file  Social History Narrative   Marital status: married; guardian is Furniture conservator/restorer      Children: none      Lives: with husband in apartment      Employment:  Disability      Tobacco: quit smoking; smoked for two years.      Alcohol ;none      Drugs: none   Has not traveled outside of the country.        Additional Social History:    Allergies:   Allergies  Allergen Reactions  . Bee Venom Anaphylaxis  . Coconut Flavor Anaphylaxis and Rash  . Geodon [Ziprasidone Hcl] Other (See Comments)    Pt states that this medication causes paralysis of the mouth.    . Haloperidol And Related Other (See Comments)    Pt states that this medication causes paralysis of the mouth.    . Lithium Other (See Comments)    Reaction:  Seizure-like activity.    . Oxycodone Other (See Comments)    Reaction:   Hallucinations   . Seroquel [Quetiapine Fumarate] Other (See Comments)    Pt states that this medication is too strong.   . Shellfish Allergy Anaphylaxis  . Phenergan [Promethazine Hcl] Other (See Comments)    Reaction:  Chest pain    . Prilosec [Omeprazole] Nausea And Vomiting  . Sulfa Antibiotics Other (See Comments)    Chest pain   . Tegretol [Carbamazepine] Nausea And Vomiting    Labs:  Results for orders placed or performed during the hospital encounter of 08/19/17 (from the past 48 hour(s))  Comprehensive metabolic panel     Status: Abnormal   Collection Time: 08/19/17 12:47 AM  Result Value Ref Range   Sodium 141 135 - 145 mmol/L   Potassium 3.6 3.5 - 5.1 mmol/L   Chloride 114 (H) 98 - 111 mmol/L    Comment: Please note change in reference range.   CO2 21 (L) 22 - 32 mmol/L   Glucose, Bld 95 70 - 99 mg/dL    Comment: Please note change in reference range.   BUN 12 6 - 20 mg/dL    Comment: Please note change in reference range.   Creatinine, Ser 0.66 0.44 - 1.00 mg/dL   Calcium 9.0 8.9 - 10.3 mg/dL   Total Protein 6.7 6.5 - 8.1 g/dL   Albumin 4.0 3.5 - 5.0 g/dL   AST 18 15 - 41 U/L   ALT 14 0 - 44 U/L    Comment: Please note change in reference range.   Alkaline Phosphatase 65 38 - 126 U/L   Total Bilirubin 0.3 0.3 - 1.2 mg/dL   GFR calc non Af Amer >60 >60 mL/min   GFR calc Af Amer >60 >60 mL/min    Comment: (NOTE) The eGFR has been calculated using the CKD EPI equation. This calculation has not been validated in all clinical situations. eGFR's persistently <60 mL/min signify possible Chronic Kidney Disease.    Anion gap 6 5 - 15    Comment: Performed at [facility], [place] [address], [place], [place] [address]  Ethanol     Status: None   Collection Time: 08/19/17 12:47 AM  Result Value Ref Range   Alcohol, Ethyl (B) <10 <10 mg/dL    Comment: (NOTE) Lowest detectable limit for  serum alcohol is 10 mg/dL. For medical purposes only. Performed  at [facility], [place] [address], [place], [place] [address]   Salicylate level     Status: None   Collection Time: 08/19/17 12:47 AM  Result Value Ref Range   Salicylate Lvl <6.7 2.8 - 30.0 mg/dL    Comment: Performed at [facility], [place] [address], [place], [place] [address]  Acetaminophen level     Status: Abnormal   Collection Time: 08/19/17 12:47 AM  Result Value Ref Range   Acetaminophen (Tylenol), Serum <10 (L) 10 - 30 ug/mL    Comment: (NOTE) Therapeutic concentrations vary significantly. A range of 10-30 ug/mL  may be an effective concentration for many patients. However, some  are best treated at concentrations outside of this range. Acetaminophen concentrations >150 ug/mL at 4 hours after ingestion  and >50 ug/mL at 12 hours after ingestion are often associated with  toxic reactions. Performed at [facility], [place] [address], [place], [place] [address]   cbc     Status: None   Collection Time: 08/19/17 12:47 AM  Result Value Ref Range   WBC 8.2 4.0 - 10.5 K/uL   RBC 4.48 3.87 - 5.11 MIL/uL   Hemoglobin 12.7 12.0 - 15.0 g/dL   HCT 39.4 36.0 - 46.0 %   MCV 87.9 78.0 - 100.0 fL   MCH 28.3 26.0 - 34.0 pg   MCHC 32.2 30.0 - 36.0 g/dL   RDW 12.9 11.5 - 15.5 %   Platelets 185 150 - 400 K/uL    Comment: Performed at [facility], [place] [address], [place], [place] [address]  I-Stat beta hCG blood, ED     Status: None   Collection Time: 08/19/17 12:51 AM  Result Value Ref Range   I-stat hCG, quantitative <5.0 <5 mIU/mL   Comment 3            Comment:   GEST. AGE      CONC.  (mIU/mL)   <=1 WEEK        5 - 50     2 WEEKS       50 - 500     3 WEEKS       100 - 10,000     4 WEEKS     1,000 - 30,000        FEMALE AND NON-PREGNANT FEMALE:     LESS THAN 5 mIU/mL   Rapid urine drug screen (hospital performed)     Status: Abnormal   Collection Time: 08/19/17  1:06 AM  Result Value Ref Range   Opiates  NONE DETECTED NONE DETECTED   Cocaine NONE DETECTED NONE DETECTED   Benzodiazepines POSITIVE (A) NONE DETECTED   Amphetamines NONE DETECTED NONE DETECTED   Tetrahydrocannabinol NONE DETECTED NONE DETECTED   Barbiturates (A) NONE DETECTED    Result not available. Reagent lot number recalled by manufacturer.    Comment: Performed at [facility], [place] [address] [place], [place] [address]    No current facility-administered medications for this encounter.    Current Outpatient Medications  Medication Sig Dispense Refill  . albuterol (PROVENTIL HFA;VENTOLIN HFA) 108 (90 Base) MCG/ACT inhaler Inhale 1-2 puffs into the lungs every 6 (six) hours as needed for wheezing or shortness of breath.    . fluticasone-salmeterol (ADVAIR HFA) 115-21 MCG/ACT inhaler Inhale 2 puffs into the lungs 2 (two) times daily. 1 Inhaler 5  . hydrOXYzine (ATARAX/VISTARIL) 50 MG tablet  Take 50 mg by mouth at bedtime.    . levETIRAcetam (KEPPRA) 500 MG tablet Take 1 tablet (500 mg total) by mouth 2 (two) times daily. 60 tablet 1  . prazosin (MINIPRESS) 2 MG capsule Take 1 capsule (2 mg total) by mouth 2 (two) times daily. 60 capsule 1  . risperiDONE (RISPERDAL) 2 MG tablet Take 1 tablet (2 mg total) by mouth 3 (three) times daily. 90 tablet 1  . sertraline (ZOLOFT) 100 MG tablet Take 1.5 tablets (150 mg total) by mouth daily. 45 tablet 1  . topiramate (TOPAMAX) 100 MG tablet Take 1 tablet (100 mg total) by mouth at bedtime. 30 tablet 1  . temazepam (RESTORIL) 30 MG capsule Take 1 capsule (30 mg total) by mouth at bedtime. (Patient not taking: Reported on 08/17/2017) 30 capsule 1    Musculoskeletal: Strength & Muscle Tone: within normal limits Gait & Station: normal Patient leans: N/A  Psychiatric Specialty Exam: Physical Exam  ROS  Blood pressure 122/68, pulse 91, temperature 98.2 F (36.8 C), temperature source Oral, resp. rate 16, height _0  (1.651 m), weight 77.6 kg (171 lb), SpO2 98 %, not  currently breastfeeding.Body mass index is 28.46 kg/m.  General Appearance: Casual  Eye Contact:  Good  Speech:  Clear and Coherent and Normal Rate  Volume:  Normal  Mood:  Appropriate  Affect:  Appropriate  Thought Process:  Linear  Orientation:  Full (Time, Place, and Person)  Thought Content:  Logical  Suicidal Thoughts:  No  Homicidal Thoughts:  No  Memory:  Immediate;   Good Recent;   Good Remote;   Good  Judgement:  Intact  Insight:  Fair  Psychomotor Activity:  Normal  Concentration:  Concentration: Fair and Attention Span: Fair  Recall:  Good  Fund of Knowledge:  Fair  Language:  Good  Akathisia:  No  Handed:  Right  AIMS (if indicated):     Assets:  Communication Skills Housing Social Support  ADL's:  Intact  Cognition:  WNL  Sleep:        Treatment Plan Summary: Plan Beverly Sessions is to visit patient today.  Will monitor 24 hours for stabilization  Disposition: Monitor over night for stabilization.  If ACTT team reports patient comfortabl with taking patient home patient can be dischargedl  If not will monitor patient for another 24 hours and then discharge  Shuvon Rankin, NP 08/19/2017 11:43 AM

## 2017-08-19 NOTE — BH Assessment (Addendum)
[place] Assessment Progress Note  Per Hampton Abbot, MD, pt is to be held at [facility] overnight or until her outpatient provider, the [facility], is able to come and see her and determine whether she is at baseline.  At 09:47 this Probation officer called the [employer] and spoke to [place].  She agrees to arrange for a clinician to visit pt in the ED.  She took my phone number, and agrees to call with an approximate time.  Return call and ED visit are both pending as of this writing.  Pt's nurse, [name] Jordan, has been notified.  While reviewing pt's chart, I noted that pt reportedly has a guardian.  Under the Media tab I found a letter of guardianship dated 05/25/2010, in which the court assigned guardianship to [place].  Documents specify [name] [phone]) as guardian.  Calling this number, I reached a voice message saying that Mr Mervyn Skeeters would be out of the office, and recommending that I call his supervisor, Raquel James [phone]).  This number rolled to a voice message, stating that the mail box was not configured to take messages.  I called once again and was transferred to Ms Parker's supervisor, Holli Humbles [phone]).  She reports that she believes that the court restored pt as her own guardian.  She reports that pt has been diagnosed with Asperger's Disorder.  She is uncertain whether their records include psychometric testing with an IQ, but she also reports that if pt is her own guardian, she would not be able to send test results to me.  She reports that, given that pt receives ACT Team services, she would not have a care coordinator through the [facility].  Subsequently, Raquel James calls back, verbally confirming that pt was restored as her own guardian by the court on 07/16/2017.  She agrees to fax the court documents to me; this is pending as of this writing.  Once I receive documents, I will modify pt's EPIC record accordingly.  Pt's nurse, [name] Jordan, has been notified of pt's  guardianship status.  [name], [place] [facility] Coordinator [phone]   Addendum:  Court document restoring pt's competency has been received.  A copy has been labeled to be scanned into pt's record, and a copy has been left on pt's chart.  Around 13:20 Dory Peru. with pt's ACT Team presents at [facility].  She reports that pt's functioning is currently below baseline.  She reports that this has persisted since around the time that pt's competency was restored (07/17/2017).  Pt has been non-compliant with medications, taking them only when ACT Team personnel are on hand, and not on her own.  Pt is easily frustrated by her child, of whom she has unrealistic expectations, and has been easily agitated by pt's mother-in-law (the guardian of the child), who goes out of her way to antagonize the pt.  Chinita Greenland believes that pt's repeated visits to the ED are her way of reaching out for help.  I informed her that pt would remain in the ED overnight for further observation and stabilization, but that she could only be admitted to a psychiatric facility if she were found to be a life threatening danger to herself or others.  Chinita Greenland understands this.  She agrees to check pt's record to see if she has been diagnosed with IDD, and if so, to provide [name] with psychometric testing to substantiate this.  Pt's nurse, [name] Jordan, has been informed of pt's current disposition.  [name],  [place] [facility] Coordinator [phone]

## 2017-08-19 NOTE — ED Notes (Signed)
Patient arrived to unit and is noted to have a blunted and flat affect. Patient child-like in conversation, but is cooperative at this time. Pt states "I got a baby now. She is 18 months, she lives with my husband's Momma, but he is dead now". Patient states she has thoughts of wanting to cut herself, but she verbally contracts for safety at this time. No distress noted.

## 2017-08-19 NOTE — ED Notes (Signed)
Actt team RN here to see patient.

## 2017-08-19 NOTE — ED Provider Notes (Signed)
[facility] DEPT Provider Note   CSN: [id number] Arrival date & time: 08/18/17  2357     History   Chief Complaint Chief Complaint  Patient presents with  . Suicidal    HPI [name] is a 27 y.o. female.  HPI Patient is a 26 year old female with history of drug-seeking behavior, depression, anxiety, schizoaffective disorder that presents to the emergency department today for evaluation of suicidal ideations.  Patient has plan of cutting herself.  History of same.  Has not attempted at this time.  Denies drug use, alcohol use, homicidal ideations, auditory visual hallucinations.  Patient denies any pain at this time.  Pt denies any fever, chill, ha, vision changes, lightheadedness, dizziness, congestion, neck pain, cp, sob, cough, abd pain, n/v/d, urinary symptoms, change in bowel habits, melena, hematochezia, lower extremity paresthesias.  Past Medical History:  Diagnosis Date  . Acid reflux   . Asthma    last attack 03/13/15 or 03/14/15  . Autism   . 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 Chronic constipation   . Depression   . Drug-seeking behavior   . Essential tremor   . Headache   . Overdose of acetaminophen 07/2017   and other meds  . Personality disorder ([place])   . Schizo-affective psychosis ([place])   . Schizoaffective disorder, bipolar type ([place])   . Seizures [facility])    Last seizure December 2017    Patient Active Problem List   Diagnosis Date Noted  . Drug overdose 07/22/2017  . Overdose 07/22/2017  . Intentional acetaminophen overdose ([place])   . Metabolic acidosis   . Pancreatitis, acute 06/30/2017  . Acute pancreatitis 06/30/2017  . DUB (dysfunctional uterine bleeding) 11/22/2016  . Hyperprolactinemia ([place]) 08/20/2016  . Contraceptive management 08/09/2016  . Tachycardia 01/13/2016  . 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  . Seizure disorder ([place]) 08/08/2015  . Chronic migraine 07/27/2015  . Asthma 04/15/2015  .  Schizoaffective disorder, bipolar type ([place]) 03/10/2014  . Borderline personality disorder ([place]) 03/10/2014  . PTSD (post-traumatic stress disorder) 03/10/2014  . Autism spectrum disorder 06/15/2013    Past Surgical History:  Procedure Laterality Date  . MOUTH SURGERY  2009 or 2010     OB History    Gravida  2   Para  1   Term  1   Preterm      AB  1   Living  1     SAB  1   TAB      Ectopic      Multiple  0   Live Births  1            Home Medications    Prior to Admission medications   Medication Sig Start Date End Date Taking? Authorizing Provider  albuterol (PROVENTIL HFA;VENTOLIN HFA) 108 (90 Base) MCG/ACT inhaler Inhale 1-2 puffs into the lungs every 6 (six) hours as needed for wheezing or shortness of breath.   Yes [provider]  fluticasone-salmeterol (ADVAIR HFA) 115-21 MCG/ACT inhaler Inhale 2 puffs into the lungs 2 (two) times daily. 08/03/15  Yes Timmothy Euler, MD  hydrOXYzine (ATARAX/VISTARIL) 50 MG tablet Take 50 mg by mouth at bedtime. 08/15/17  Yes [provider]  levETIRAcetam (KEPPRA) 500 MG tablet Take 1 tablet (500 mg total) by mouth 2 (two) times daily. 07/30/17  Yes Clapacs, Madie Reno, MD  prazosin (MINIPRESS) 2 MG capsule Take 1 capsule (2 mg total) by mouth 2 (two) times daily. 07/30/17  Yes  Clapacs, Madie Reno, MD  risperiDONE (RISPERDAL) 2 MG tablet Take 1 tablet (2 mg total) by mouth 3 (three) times daily. 07/30/17  Yes Clapacs, Madie Reno, MD  sertraline (ZOLOFT) 100 MG tablet Take 1.5 tablets (150 mg total) by mouth daily. 07/31/17  Yes Clapacs, Madie Reno, MD  topiramate (TOPAMAX) 100 MG tablet Take 1 tablet (100 mg total) by mouth at bedtime. 07/30/17  Yes Clapacs, Madie Reno, MD  temazepam (RESTORIL) 30 MG capsule Take 1 capsule (30 mg total) by mouth at bedtime. Patient not taking: Reported on 08/17/2017 07/30/17   Clapacs, Madie Reno, MD    Family History Family History  Problem Relation Age of Onset  . Mental illness Father   .  Asthma Father   . PDD Brother   . Seizures Brother     Social History Social History   Tobacco Use  . Smoking status: Former Smoker    Packs/day: 0.00  . Smokeless tobacco: Never Used  . Tobacco comment: Smoked for 2  years age [age]  Substance Use Topics  . Alcohol use: No    Alcohol/week: 0.6 oz    Types: 1 Standard drinks or equivalent per week  . Drug use: No    Comment: History of cocaine use at age 28 for 4 months     Allergies   Bee venom; Coconut flavor; Geodon [ziprasidone hcl]; Haloperidol and related; Lithium; Oxycodone; Seroquel [quetiapine fumarate]; Shellfish allergy; Phenergan [promethazine hcl]; Prilosec [omeprazole]; Sulfa antibiotics; and Tegretol [carbamazepine]   Review of Systems Review of Systems  All other systems reviewed and are negative.    Physical Exam Updated Vital Signs BP 106/60 (BP Location: Left Arm)   Pulse 84   Temp 98.2 F (36.8 C) (Oral)   Resp 16   Ht 5\' 5"  (1.651 m)   Wt 77.6 kg (171 lb)   SpO2 100%   BMI 28.46 kg/m   Physical Exam  Constitutional: She appears well-developed and well-nourished. No distress.  HENT:  Head: Normocephalic and atraumatic.  Eyes: Right eye exhibits no discharge. Left eye exhibits no discharge. No scleral icterus.  Neck: Normal range of motion.  Pulmonary/Chest: No respiratory distress.  Musculoskeletal: Normal range of motion.  Neurological: She is alert.  Skin: No pallor.  Psychiatric: Her behavior is normal. Judgment and thought content normal.  Nursing note and vitals reviewed.    ED Treatments / Results  Labs (all labs ordered are listed, but only abnormal results are displayed) Labs Reviewed  COMPREHENSIVE METABOLIC PANEL - Abnormal; Notable for the following components:      Result Value   Chloride 114 (*)    CO2 21 (*)    All other components within normal limits  ACETAMINOPHEN LEVEL - Abnormal; Notable for the following components:   Acetaminophen (Tylenol), Serum <10 (*)      All other components within normal limits  RAPID URINE DRUG SCREEN, HOSP PERFORMED - Abnormal; Notable for the following components:   Benzodiazepines POSITIVE (*)    Barbiturates   (*)    Value: Result not available. Reagent lot number recalled by manufacturer.   All other components within normal limits  ETHANOL  SALICYLATE LEVEL  CBC  I-STAT BETA HCG BLOOD, ED ([facility], [facility], [facility] ONLY)    EKG None  Radiology No results found.  Procedures Procedures (including critical care time)  Medications Ordered in ED Medications - No data to display   Initial Impression / Assessment and Plan / ED Course  I have reviewed the triage  vital signs and the nursing notes.  Pertinent labs & imaging results that were available during my care of the patient were reviewed by me and considered in my medical decision making (see chart for details).     Patient presents to the ED for suicidal ideations.  Patient has no medical complaints this time.  Work-up has been reassuring including lab work and physical exam.  Patient can be medically cleared for TTS evaluation and disposition.  Final Clinical Impressions(s) / ED Diagnoses   Final diagnoses:  Suicidal ideation    ED Discharge Orders    None       Aaron Edelman 08/19/17 0235    Rolland Porter, MD 08/19/17 [phone]

## 2017-08-20 DIAGNOSIS — F250 Schizoaffective disorder, bipolar type: Secondary | ICD-10-CM | POA: Diagnosis not present

## 2017-08-20 NOTE — ED Notes (Signed)
Pt d/c home per MD order. Discharge summary reviewed with pt. Pt verbalizes understanding. Pt denies SI/HI/AVH. Pt signed for personal property and property returned. Pt signed e-signature. Ambulatory off unit with MHT.

## 2017-08-20 NOTE — [facility] Suicide Risk Assessment (Signed)
Suicide Risk Assessment  Discharge Assessment   [facility] Discharge Suicide Risk Assessment   Principal Problem: Schizoaffective disorder, bipolar type [facility]) Discharge Diagnoses:  Patient Active Problem List   Diagnosis Date Noted  . Schizoaffective disorder, bipolar type ([place]) [F25.0] 03/10/2014    Priority: High  . PTSD (post-traumatic stress disorder) [F43.10] 03/10/2014    Priority: High  . Drug overdose [T50.901A] 07/22/2017  . Overdose [T50.901A] 07/22/2017  . Intentional acetaminophen overdose ([place]) [T39.1X2A]   . Metabolic acidosis [S97.0]   . Pancreatitis, acute [K85.90] 06/30/2017  . Acute pancreatitis [K85.90] 06/30/2017  . DUB (dysfunctional uterine bleeding) [N93.8] 11/22/2016  . Hyperprolactinemia ([place]) [E22.1] 08/20/2016  . Contraceptive management [Z30.9] 08/09/2016  . Tachycardia [R00.0] 01/13/2016  . 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 [Z14.8] 09/08/2015  . Seizure disorder ([place]) [Y63.785] 08/08/2015  . Chronic migraine [G43.709] 07/27/2015  . Asthma [J45.909] 04/15/2015  . Borderline personality disorder ([place]) [F60.3] 03/10/2014  . Autism spectrum disorder [F84.0] 06/15/2013    Total Time spent with patient: 30 minutes  Musculoskeletal: Strength & Muscle Tone: within normal limits Gait & Station: normal Patient leans: N/A  Psychiatric Specialty Exam: Physical Exam  Nursing note and vitals reviewed. Constitutional: She is oriented to person, place, and time. She appears well-developed and well-nourished.  HENT:  Head: Normocephalic.  Neck: Normal range of motion.  Respiratory: Effort normal.  Musculoskeletal: Normal range of motion.  Neurological: She is alert and oriented to person, place, and time.  Psychiatric: She has a normal mood and affect. Her speech is normal and behavior is normal. Judgment and thought content normal. Cognition and memory are normal.    Review of Systems  All other systems reviewed and are negative.   Blood pressure (!) 99/53,  pulse 64, temperature 97.9 F (36.6 C), temperature source Oral, resp. rate 18, height 5\' 5"  (1.651 m), weight 77.6 kg (171 lb), SpO2 99 %, not currently breastfeeding.Body mass index is 28.46 kg/m.  General Appearance: Casual  Eye Contact:  Good  Speech:  Normal Rate  Volume:  Normal  Mood:  Anxious and Depressed, mild  Affect:  Congruent  Thought Process:  Coherent and Descriptions of Associations: Intact  Orientation:  Full (Time, Place, and Person)  Thought Content:  WDL and Logical  Suicidal Thoughts:  No  Homicidal Thoughts:  No  Memory:  Immediate;   Good Recent;   Good Remote;   Good  Judgement:  Fair  Insight:  Fair  Psychomotor Activity:  Normal  Concentration:  Concentration: Good and Attention Span: Good  Recall:  Good  Fund of Knowledge:  Fair  Language:  Good  Akathisia:  No  Handed:  Right  AIMS (if indicated):     Assets:  Housing Leisure Time Physical Health Resilience Social Support  ADL's:  Intact  Cognition:  WNL  Sleep:       Mental Status Per Nursing Assessment::   On Admission:   suicidal ideations  Demographic Factors:  Adolescent or young adult and Caucasian  Loss Factors: NA  Historical Factors: NA  Risk Reduction Factors:   Sense of responsibility to family, Living with another person, especially a relative, Positive social support and Positive therapeutic relationship  Continued Clinical Symptoms:  Depression and anxiety, mild  Cognitive Features That Contribute To Risk:  None    Suicide Risk:  Minimal: No identifiable suicidal ideation.  Patients presenting with no risk factors but with morbid ruminations; may be classified as minimal risk based on the severity of the depressive symptoms  Plan Of Care/Follow-up recommendations:  Activity:  as tolerated Diet:  heart healthy diet  Emalynn Clewis, NP 08/20/2017, 10:45 AM

## 2017-08-20 NOTE — BH Assessment (Signed)
[facility] Assessment Progress Note  Per Buford Dresser, DO, this pt does not require psychiatric hospitalization at this time.  Pt is to be discharged from [facility] with recommendation to continue treatment with the [facility] Team.  This has been included in pt's discharge instructions.  At Dr Tamala Fothergill request, this writer called the [facility] Team and notified them.  Pt's nurse, Caryl Pina, has also been notified.  [name], [place] Triage Specialist [phone]

## 2017-08-20 NOTE — Consult Note (Addendum)
[place] [facility] Consult   Reason for Consult:  Suicidal ideations Referring Physician:  EDP Patient Identification: Sumire Halbleib MRN:  [id number] Principal Diagnosis: Schizoaffective disorder, bipolar type [facility]) Diagnosis:   Patient Active Problem List   Diagnosis Date Noted  . Schizoaffective disorder, bipolar type ([place]) [F25.0] 03/10/2014    Priority: High  . PTSD (post-traumatic stress disorder) [F43.10] 03/10/2014    Priority: High  . Drug overdose [T50.901A] 07/22/2017  . Overdose [T50.901A] 07/22/2017  . Intentional acetaminophen overdose ([place]) [T39.1X2A]   . Metabolic acidosis [G99.2]   . Pancreatitis, acute [K85.90] 06/30/2017  . Acute pancreatitis [K85.90] 06/30/2017  . DUB (dysfunctional uterine bleeding) [N93.8] 11/22/2016  . Hyperprolactinemia ([place]) [E22.1] 08/20/2016  . Contraceptive management [Z30.9] 08/09/2016  . Tachycardia [R00.0] 01/13/2016  . 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 [Z14.8] 09/08/2015  . Seizure disorder ([place]) [E26.834] 08/08/2015  . Chronic migraine [G43.709] 07/27/2015  . Asthma [J45.909] 04/15/2015  . Borderline personality disorder ([place]) [F60.3] 03/10/2014  . Autism spectrum disorder [F84.0] 06/15/2013    Total Time spent with patient: 30 minutes  Subjective:   Brunette Lavalle is a 27 y.o. female patient is stable for discharge.  HPI:  27 yo female who came to the ED with suicidal ideations.  She was kept overnight and stabilized.  Today, she is ready to go and would like her ACT team notified.  She recently was discharged from [facility] and went to [facility], discharged.  Encouraged her to use her resources and not the ED.  No suicidal/homicidal ideations, hallucinations, and substance abuse.  Stable for discharge.  Past Psychiatric History: schizoaffective disorder  Risk to Self: None Risk to Others: Homicidal Ideation: No Thoughts of Harm to Others: No Current Homicidal Intent: No Current Homicidal Plan: No Access to  Homicidal Means: No History of harm to others?: No Assessment of Violence: None Noted Does patient have access to weapons?: No Criminal Charges Pending?: No Does patient have a court date: No Prior Inpatient Therapy: Prior Inpatient Therapy: Yes Prior Therapy Dates: June 2019 Prior Therapy Facilty/Provider(s): [facility] Reason for Treatment: SI Prior Outpatient Therapy: Prior Outpatient Therapy: Yes Prior Therapy Dates: Current Prior Therapy Facilty/Provider(s): [facility] Reason for Treatment: Schizoaffective disorder Does patient have an ACCT team?: Yes Does patient have Intensive In-House Services?  : No Does patient have [facility] services? : Yes Does patient have P4[facility] services?: No  Past Medical History:  Past Medical History:  Diagnosis Date  . Acid reflux   . Asthma    last attack 03/13/15 or 03/14/15  . Autism   . 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 Chronic constipation   . Depression   . Drug-seeking behavior   . Essential tremor   . Headache   . Overdose of acetaminophen 07/2017   and other meds  . Personality disorder ([place])   . Schizo-affective psychosis ([place])   . Schizoaffective disorder, bipolar type ([name])   . Seizures [facility])    Last seizure December 2017    Past Surgical History:  Procedure Laterality Date  . MOUTH SURGERY  2009 or 2010   Family History:  Family History  Problem Relation Age of Onset  . Mental illness Father   . Asthma Father   . PDD Brother   . Seizures Brother    Family Psychiatric  History: brother with PDD, father with mental illness Social History:  Social History   Substance and Sexual Activity  Alcohol Use No  . Alcohol/week: 0.6 oz  . Types: 1 Standard  drinks or equivalent per week     Social History   Substance and Sexual Activity  Drug Use No   Comment: History of cocaine use at age 19 for 4 months    Social History   Socioeconomic History  . Marital status: Widowed    Spouse name: Not on file  . Number of children: 0  .  Years of education: Not on file  . Highest education level: Not on file  Occupational History  . Occupation: disability  Social Needs  . Financial resource strain: Not on file  . Food insecurity:    Worry: Not on file    Inability: Not on file  . Transportation needs:    Medical: Not on file    Non-medical: Not on file  Tobacco Use  . Smoking status: Former Smoker    Packs/day: 0.00  . Smokeless tobacco: Never Used  . Tobacco comment: Smoked for 2  years age [age]  Substance and Sexual Activity  . Alcohol use: No    Alcohol/week: 0.6 oz    Types: 1 Standard drinks or equivalent per week  . Drug use: No    Comment: History of cocaine use at age 53 for 4 months  . Sexual activity: Not Currently    Birth control/protection: None  Lifestyle  . Physical activity:    Days per week: Not on file    Minutes per session: Not on file  . Stress: Not on file  Relationships  . Social connections:    Talks on phone: Not on file    Gets together: Not on file    Attends religious service: Not on file    Active member of club or organization: Not on file    Attends meetings of clubs or organizations: Not on file    Relationship status: Not on file  Other Topics Concern  . Not on file  Social History Narrative   Marital status: married; guardian is Furniture conservator/restorer      Children: none      Lives: with husband in apartment      Employment:  Disability      Tobacco: quit smoking; smoked for two years.      Alcohol ;none      Drugs: none   Has not traveled outside of the country.        Additional Social History: N/A    Allergies:   Allergies  Allergen Reactions  . Bee Venom Anaphylaxis  . Coconut Flavor Anaphylaxis and Rash  . Geodon [Ziprasidone Hcl] Other (See Comments)    Pt states that this medication causes paralysis of the mouth.    . Haloperidol And Related Other (See Comments)    Pt states that this medication causes paralysis of the mouth.    . Lithium Other (See Comments)     Reaction:  Seizure-like activity.    . Oxycodone Other (See Comments)    Reaction:  Hallucinations   . Seroquel [Quetiapine Fumarate] Other (See Comments)    Pt states that this medication is too strong.   . Shellfish Allergy Anaphylaxis  . Phenergan [Promethazine Hcl] Other (See Comments)    Reaction:  Chest pain    . Prilosec [Omeprazole] Nausea And Vomiting  . Sulfa Antibiotics Other (See Comments)    Chest pain   . Tegretol [Carbamazepine] Nausea And Vomiting    Labs:  Results for orders placed or performed during the hospital encounter of 08/19/17 (from the past 48 hour(s))  Comprehensive metabolic  panel     Status: Abnormal   Collection Time: 08/19/17 12:47 AM  Result Value Ref Range   Sodium 141 135 - 145 mmol/L   Potassium 3.6 3.5 - 5.1 mmol/L   Chloride 114 (H) 98 - 111 mmol/L    Comment: Please note change in reference range.   CO2 21 (L) 22 - 32 mmol/L   Glucose, Bld 95 70 - 99 mg/dL    Comment: Please note change in reference range.   BUN 12 6 - 20 mg/dL    Comment: Please note change in reference range.   Creatinine, Ser 0.66 0.44 - 1.00 mg/dL   Calcium 9.0 8.9 - 10.3 mg/dL   Total Protein 6.7 6.5 - 8.1 g/dL   Albumin 4.0 3.5 - 5.0 g/dL   AST 18 15 - 41 U/L   ALT 14 0 - 44 U/L    Comment: Please note change in reference range.   Alkaline Phosphatase 65 38 - 126 U/L   Total Bilirubin 0.3 0.3 - 1.2 mg/dL   GFR calc non Af Amer >60 >60 mL/min   GFR calc Af Amer >60 >60 mL/min    Comment: (NOTE) The eGFR has been calculated using the CKD EPI equation. This calculation has not been validated in all clinical situations. eGFR's persistently <60 mL/min signify possible Chronic Kidney Disease.    Anion gap 6 5 - 15    Comment: Performed at [facility], [place] [address], [place], [place] [address]  Ethanol     Status: None   Collection Time: 08/19/17 12:47 AM  Result Value Ref Range   Alcohol, Ethyl (B) <10 <10 mg/dL    Comment:  (NOTE) Lowest detectable limit for serum alcohol is 10 mg/dL. For medical purposes only. Performed at [facility], [place] [address], [place], [place] [address]   Salicylate level     Status: None   Collection Time: 08/19/17 12:47 AM  Result Value Ref Range   Salicylate Lvl <8.5 2.8 - 30.0 mg/dL    Comment: Performed at [facility], [place] [address], [place], [place] [address]  Acetaminophen level     Status: Abnormal   Collection Time: 08/19/17 12:47 AM  Result Value Ref Range   Acetaminophen (Tylenol), Serum <10 (L) 10 - 30 ug/mL    Comment: (NOTE) Therapeutic concentrations vary significantly. A range of 10-30 ug/mL  may be an effective concentration for many patients. However, some  are best treated at concentrations outside of this range. Acetaminophen concentrations >150 ug/mL at 4 hours after ingestion  and >50 ug/mL at 12 hours after ingestion are often associated with  toxic reactions. Performed at [facility], [place] [address] [place], [place] [address]   cbc     Status: None   Collection Time: 08/19/17 12:47 AM  Result Value Ref Range   WBC 8.2 4.0 - 10.5 K/uL   RBC 4.48 3.87 - 5.11 MIL/uL   Hemoglobin 12.7 12.0 - 15.0 g/dL   HCT 39.4 36.0 - 46.0 %   MCV 87.9 78.0 - 100.0 fL   MCH 28.3 26.0 - 34.0 pg   MCHC 32.2 30.0 - 36.0 g/dL   RDW 12.9 11.5 - 15.5 %   Platelets 185 150 - 400 K/uL    Comment: Performed at [facility], [place] [address], [place], [place] [address]  I-Stat beta hCG blood, ED     Status: None   Collection Time: 08/19/17 12:51 AM  Result Value Ref Range   I-stat hCG, quantitative <5.0 <5 mIU/mL   Comment 3            Comment:   GEST. AGE      CONC.  (mIU/mL)   <=1 WEEK        5 - 50     2 WEEKS       50 - 500     3 WEEKS       100 - 10,000     4 WEEKS     1,000 - 30,000        FEMALE AND NON-PREGNANT FEMALE:     LESS THAN 5 mIU/mL   Rapid urine drug screen (hospital  performed)     Status: Abnormal   Collection Time: 08/19/17  1:06 AM  Result Value Ref Range   Opiates NONE DETECTED NONE DETECTED   Cocaine NONE DETECTED NONE DETECTED   Benzodiazepines POSITIVE (A) NONE DETECTED   Amphetamines NONE DETECTED NONE DETECTED   Tetrahydrocannabinol NONE DETECTED NONE DETECTED   Barbiturates (A) NONE DETECTED    Result not available. Reagent lot number recalled by manufacturer.    Comment: Performed at [facility], [place] [address], [place], [place] [address]    Current Facility-Administered Medications  Medication Dose Route Frequency Provider Last Rate Last Dose  . albuterol (PROVENTIL HFA;VENTOLIN HFA) 108 (90 Base) MCG/ACT inhaler 1-2 puff  1-2 puff Inhalation Q6H PRN Rankin, Shuvon B, NP      . hydrOXYzine (ATARAX/VISTARIL) tablet 50 mg  50 mg Oral QHS Rankin, Shuvon B, NP   50 mg at 08/19/17 2049  . levETIRAcetam (KEPPRA) tablet 500 mg  500 mg Oral BID Rankin, Shuvon B, NP   500 mg at 08/20/17 0910  . mometasone-formoterol (DULERA) 200-5 MCG/ACT inhaler 2 puff  2 puff Inhalation BID Rankin, Shuvon B, NP   2 puff at 08/20/17 0909  . prazosin (MINIPRESS) capsule 2 mg  2 mg Oral BID Rankin, Shuvon B, NP   2 mg at 08/20/17 0909  . risperiDONE (RISPERDAL) tablet 2 mg  2 mg Oral TID Rankin, Shuvon B, NP   2 mg at 08/20/17 0910  . sertraline (ZOLOFT) tablet 150 mg  150 mg Oral Daily Rankin, Shuvon B, NP   150 mg at 08/20/17 0910  . topiramate (TOPAMAX) tablet 100 mg  100 mg Oral QHS Rankin, Shuvon B, NP   100 mg at 08/19/17 2049   Current Outpatient Medications  Medication Sig Dispense Refill  . albuterol (PROVENTIL HFA;VENTOLIN HFA) 108 (90 Base) MCG/ACT inhaler Inhale 1-2 puffs into the lungs every 6 (six) hours as needed for wheezing or shortness of breath.    . fluticasone-salmeterol (ADVAIR HFA) 115-21 MCG/ACT inhaler Inhale 2 puffs into the lungs 2 (two) times daily. 1 Inhaler 5  . hydrOXYzine (ATARAX/VISTARIL) 50 MG tablet Take 50 mg  by mouth at bedtime.    . levETIRAcetam (KEPPRA) 500 MG tablet Take 1 tablet (500 mg total) by mouth 2 (two) times daily. 60 tablet 1  . prazosin (MINIPRESS) 2 MG capsule Take 1 capsule (2 mg total) by mouth 2 (two) times daily. 60 capsule 1  . risperiDONE (RISPERDAL) 2 MG tablet Take 1 tablet (2 mg total) by mouth 3 (three) times daily. 90 tablet 1  . sertraline (ZOLOFT) 100 MG tablet Take 1.5 tablets (150 mg total) by mouth daily. 45 tablet 1  . topiramate (TOPAMAX) 100 MG tablet Take 1 tablet (100 mg total) by mouth at  bedtime. 30 tablet 1  . temazepam (RESTORIL) 30 MG capsule Take 1 capsule (30 mg total) by mouth at bedtime. (Patient not taking: Reported on 08/17/2017) 30 capsule 1    Musculoskeletal: Strength & Muscle Tone: within normal limits Gait & Station: normal Patient leans: N/A  Psychiatric Specialty Exam: Physical Exam  Nursing note and vitals reviewed. Constitutional: She is oriented to person, place, and time. She appears well-developed and well-nourished.  HENT:  Head: Normocephalic and atraumatic.  Neck: Normal range of motion.  Respiratory: Effort normal.  Musculoskeletal: Normal range of motion.  Neurological: She is alert and oriented to person, place, and time.  Psychiatric: She has a normal mood and affect. Her speech is normal and behavior is normal. Judgment and thought content normal. Cognition and memory are normal.    Review of Systems  All other systems reviewed and are negative.   Blood pressure (!) 99/53, pulse 64, temperature 97.9 F (36.6 C), temperature source Oral, resp. rate 18, height _0  (1.651 m), weight 77.6 kg (171 lb), SpO2 99 %, not currently breastfeeding.Body mass index is 28.46 kg/m.  General Appearance: Casual  Eye Contact:  Good  Speech:  Normal Rate  Volume:  Normal  Mood:  Anxious and Depressed, mild  Affect:  Congruent  Thought Process:  Coherent and Descriptions of Associations: Intact  Orientation:  Full (Time, Place, and  Person)  Thought Content:  WDL and Logical  Suicidal Thoughts:  No  Homicidal Thoughts:  No  Memory:  Immediate;   Good Recent;   Good Remote;   Good  Judgement:  Fair  Insight:  Fair  Psychomotor Activity:  Normal  Concentration:  Concentration: Good and Attention Span: Good  Recall:  Good  Fund of Knowledge:  Fair  Language:  Good  Akathisia:  No  Handed:  Right  AIMS (if indicated):   N/A  Assets:  Housing Leisure Time Physical Health Resilience Social Support  ADL's:  Intact  Cognition:  WNL  Sleep:   N/A     Treatment Plan Summary: Schizoaffective disorder, bipolar type: -Continued Vistaril 50 mg at bedtime for sleep/anxiety -Continued Prazosin 2 mg BID for nightmares -Continued Risperdal 2 mg TID for mood stabilization -Continued Zoloft 150 mg daily for depression  Disposition: No evidence of imminent risk to self or others at present.    Waylan Boga, NP 08/20/2017 10:15 AM   Patient seen face-to-face for psychiatric evaluation, chart reviewed and case discussed with the physician extender and developed treatment plan. Reviewed the information documented and agree with the treatment plan.  Buford Dresser, DO 08/20/17 5:43 PM

## 2017-08-20 NOTE — Discharge Instructions (Addendum)
For your behavioral health needs, you are advised to continue treatment with the [facility] ACT Team:       [facility]      [address]      [place], [place] [address]      [phone]      [place] Number: [phone]

## 2017-08-23 NOTE — Discharge Summary (Signed)
Physician Discharge Summary Note  Patient:  [name] is an 27 y.o., female MRN:  [id number] DOB:  Dec 26, 1990 Patient phone:  [phone] (home)  Patient address:   [place] [address],  Total Time spent with patient: 45 minutes  Date of Admission:  07/24/2017 Date of Discharge: August 02, 2017  Reason for Admission: Patient was admitted to the hospital because of worsening suicidal ideation mood instability psychotic symptoms.  Principal Problem: Schizoaffective disorder, bipolar type [facility]) Discharge Diagnoses: Patient Active Problem List   Diagnosis Date Noted  . Drug overdose [T50.901A] 07/22/2017  . Overdose [T50.901A] 07/22/2017  . Intentional acetaminophen overdose ([place]) [T39.1X2A]   . Metabolic acidosis [P59.1]   . Pancreatitis, acute [K85.90] 06/30/2017  . Acute pancreatitis [K85.90] 06/30/2017  . DUB (dysfunctional uterine bleeding) [N93.8] 11/22/2016  . Hyperprolactinemia ([place]) [E22.1] 08/20/2016  . Contraceptive management [Z30.9] 08/09/2016  . Tachycardia [R00.0] 01/13/2016  . 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 [Z14.8] 09/08/2015  . Seizure disorder ([place]) [M38.466] 08/08/2015  . Chronic migraine [G43.709] 07/27/2015  . Asthma [J45.909] 04/15/2015  . Schizoaffective disorder, bipolar type ([place]) [F25.0] 03/10/2014  . Borderline personality disorder ([place]) [F60.3] 03/10/2014  . PTSD (post-traumatic stress disorder) [F43.10] 03/10/2014  . Autism spectrum disorder [F84.0] 06/15/2013    Past Psychiatric History: Patient has a long history of chronic mental health problems several prior hospitalizations difficulty controlling symptoms.  Past Medical History:  Past Medical History:  Diagnosis Date  . Acid reflux   . Asthma    last attack 03/13/15 or 03/14/15  . Autism   . 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 Chronic constipation   . Depression   . Drug-seeking behavior   . Essential tremor   . Headache   . Overdose of acetaminophen  07/2017   and other meds  . Personality disorder ([place])   . Schizo-affective psychosis ([place])   . Schizoaffective disorder, bipolar type ([place])   . Seizures [facility])    Last seizure December 2017    Past Surgical History:  Procedure Laterality Date  . MOUTH SURGERY  2009 or 2010   Family History:  Family History  Problem Relation Age of Onset  . Mental illness Father   . Asthma Father   . PDD Brother   . Seizures Brother    Family Psychiatric  History: None identified Social History:  Social History   Substance and Sexual Activity  Alcohol Use No  . Alcohol/week: 0.6 oz  . Types: 1 Standard drinks or equivalent per week     Social History   Substance and Sexual Activity  Drug Use No   Comment: History of cocaine use at age 19 for 4 months    Social History   Socioeconomic History  . Marital status: Widowed    Spouse name: Not on file  . Number of children: 0  . Years of education: Not on file  . Highest education level: Not on file  Occupational History  . Occupation: disability  Social Needs  . Financial resource strain: Not on file  . Food insecurity:    Worry: Not on file    Inability: Not on file  . Transportation needs:    Medical: Not on file    Non-medical: Not on file  Tobacco Use  . Smoking status: Former Smoker    Packs/day: 0.00  . Smokeless tobacco: Never Used  . Tobacco comment: Smoked for 2  years age [age]  Substance and Sexual Activity  .  Alcohol use: No    Alcohol/week: 0.6 oz    Types: 1 Standard drinks or equivalent per week  . Drug use: No    Comment: History of cocaine use at age 55 for 4 months  . Sexual activity: Not Currently    Birth control/protection: None  Lifestyle  . Physical activity:    Days per week: Not on file    Minutes per session: Not on file  . Stress: Not on file  Relationships  . Social connections:    Talks on phone: Not on file    Gets together: Not on file    Attends religious service: Not on file     Active member of club or organization: Not on file    Attends meetings of clubs or organizations: Not on file    Relationship status: Not on file  Other Topics Concern  . Not on file  Social History Narrative   Marital status: married; guardian is Furniture conservator/restorer      Children: none      Lives: with husband in apartment      Employment:  Disability      Tobacco: quit smoking; smoked for two years.      Alcohol ;none      Drugs: none   Has not traveled outside of the country.         Hospital Course: Patient was treated on the psychiatric ward with standard protocol with medication management and individual and group therapy.  Treatment team met and discussed treatment plan with the patient.  Goals were set.  Medication changes were made appropriate to symptoms and overall progress.  Patient was engaged in individual and group daily therapy and reassessment.  Symptoms gradually improved to where it was felt that she was safe and no longer in acute danger to herself and could be managed outside the hospital.  Patient was discharged with plans that she would continue psychiatric follow-up in the community  Physical Findings: AIMS:  , ,  ,  ,    CIWA:    COWS:     Musculoskeletal: Strength & Muscle Tone: within normal limits Gait & Station: normal Patient leans: N/A  Psychiatric Specialty Exam: Physical Exam  Nursing note and vitals reviewed. Constitutional: She appears well-developed and well-nourished.  HENT:  Head: Normocephalic and atraumatic.  Eyes: Pupils are equal, round, and reactive to light. Conjunctivae are normal.  Neck: Normal range of motion.  Cardiovascular: Normal heart sounds.  Respiratory: Effort normal.  GI: Soft.  Musculoskeletal: Normal range of motion.  Neurological: She is alert.  Skin: Skin is warm and dry.  Psychiatric: She has a normal mood and affect. Her behavior is normal. Judgment and thought content normal.    Review of Systems  Constitutional:  Negative.   HENT: Negative.   Eyes: Negative.   Respiratory: Negative.   Cardiovascular: Negative.   Gastrointestinal: Negative.   Musculoskeletal: Negative.   Skin: Negative.   Neurological: Negative.   Psychiatric/Behavioral: Negative for depression, hallucinations, memory loss, substance abuse and suicidal ideas. The patient is not nervous/anxious and does not have insomnia.     Blood pressure (!) 100/49, pulse 96, temperature 97.9 F (36.6 C), temperature source Oral, resp. rate 16, height 5' 4"  (1.626 m), weight 77.1 kg (170 lb), SpO2 100 %, not currently breastfeeding.Body mass index is 29.18 kg/m.  General Appearance: Casual  Eye Contact:  Fair  Speech:  Clear and Coherent  Volume:  Decreased  Mood:  Euthymic  Affect:  Congruent  Thought Process:  Goal Directed  Orientation:  Full (Time, Place, and Person)  Thought Content:  Logical  Suicidal Thoughts:  No  Homicidal Thoughts:  No  Memory:  Immediate;   Fair Recent;   Fair Remote;   Fair  Judgement:  Fair  Insight:  Fair  Psychomotor Activity:  Decreased  Concentration:  Concentration: Fair  Recall:  [place] of Knowledge:  Fair  Language:  Fair  Akathisia:  No  Handed:  Right  AIMS (if indicated):     Assets:  Communication Skills Desire for Improvement Housing Resilience  ADL's:  Intact  Cognition:  WNL  Sleep:  Number of Hours: 6     Have you used any form of tobacco in the last 30 days? (Cigarettes, Smokeless Tobacco, Cigars, and/or Pipes): No  Has this patient used any form of tobacco in the last 30 days? (Cigarettes, Smokeless Tobacco, Cigars, and/or Pipes) Yes, No  Blood Alcohol level:  Lab Results  Component Value Date   ETH <10 08/19/2017   ETH <10 77/82/4235    Metabolic Disorder Labs:  Lab Results  Component Value Date   HGBA1C 4.9 07/25/2017   MPG 93.93 07/25/2017   Lab Results  Component Value Date   PROLACTIN 129.0 (H) 07/25/2017   PROLACTIN 194.7 (H) 11/22/2016   Lab Results   Component Value Date   CHOL 178 07/25/2017   TRIG 99 07/25/2017   HDL 51 07/25/2017   CHOLHDL 3.5 07/25/2017   VLDL 20 07/25/2017   LDLCALC 107 (H) 07/25/2017   LDLCALC 99 07/16/2015    See Psychiatric Specialty Exam and Suicide Risk Assessment completed by Attending Physician prior to discharge.  Discharge destination:  Home  Is patient on multiple antipsychotic therapies at discharge:  No   Has Patient had three or more failed trials of antipsychotic monotherapy by history:  No  Recommended Plan for Multiple Antipsychotic Therapies: NA  Discharge Instructions    Diet - low sodium heart healthy   Complete by:  As directed    Increase activity slowly   Complete by:  As directed      Allergies as of 07/31/2017      Reactions   Bee Venom Anaphylaxis   Coconut Flavor Anaphylaxis, Rash   Geodon [ziprasidone Hcl] Other (See Comments)   Pt states that this medication causes paralysis of the mouth.     Haloperidol And Related Other (See Comments)   Pt states that this medication causes paralysis of the mouth.     Lithium Other (See Comments)   Reaction:  Seizure-like activity.    Oxycodone Other (See Comments)   Reaction:  Hallucinations    Seroquel [quetiapine Fumarate] Other (See Comments)   Pt states that this medication is too strong.    Shellfish Allergy Anaphylaxis   Phenergan [promethazine Hcl] Other (See Comments)   Reaction:  Chest pain    Prilosec [omeprazole] Nausea And Vomiting   Sulfa Antibiotics Other (See Comments)   Chest pain    Tegretol [carbamazepine] Nausea And Vomiting      Medication List    STOP taking these medications   levonorgestrel-ethinyl estradiol 0.15-0.03 MG tablet Commonly known as:  JOLESSA   magnesium gluconate 500 MG tablet Commonly known as:  MAGONATE   multivitamin tablet     TAKE these medications     Indication  albuterol 108 (90 Base) MCG/ACT inhaler Commonly known as:  PROVENTIL HFA;VENTOLIN HFA Inhale 1-2 puffs  into the lungs every 6 (six)  hours as needed for wheezing or shortness of breath.  Indication:  Asthma   fluticasone-salmeterol 115-21 MCG/ACT inhaler Commonly known as:  ADVAIR HFA Inhale 2 puffs into the lungs 2 (two) times daily.  Indication:  Asthma   levETIRAcetam 500 MG tablet Commonly known as:  KEPPRA Take 1 tablet (500 mg total) by mouth 2 (two) times daily.  Indication:  Seizure   prazosin 2 MG capsule Commonly known as:  MINIPRESS Take 1 capsule (2 mg total) by mouth 2 (two) times daily.  Indication:  nightmares   risperiDONE 2 MG tablet Commonly known as:  RISPERDAL Take 1 tablet (2 mg total) by mouth 3 (three) times daily.  Indication:  Psychosis   sertraline 100 MG tablet Commonly known as:  ZOLOFT Take 1.5 tablets (150 mg total) by mouth daily.  Indication:  Major Depressive Disorder   topiramate 100 MG tablet Commonly known as:  TOPAMAX Take 1 tablet (100 mg total) by mouth at bedtime.  Indication:  Migraine Headache      Follow-up Information    [facility] Follow up on 08/01/2017.   Specialty:  Behavioral Health Why:  Dr. Josetta Huddle with your ACTT will be visiting with you on Thursday, August 01, 2017 at your home. Contact information: [place] [address] [phone]           Follow-up recommendations:  Activity:  Activity as tolerated Diet:  Heart healthy diet Other:  Follow-up with [facility] in the community  Comments: Patient appeared to have stabilized and was tolerating medicine well and will continue following up with her act team in [place].  Signed: Alethia Berthold, MD 08/23/2017, 1:17 PM

## 2017-08-28 ENCOUNTER — Other Ambulatory Visit: Payer: Self-pay | Admitting: Gastroenterology

## 2017-08-28 DIAGNOSIS — F3130 Bipolar disorder, current episode depressed, mild or moderate severity, unspecified: Secondary | ICD-10-CM | POA: Diagnosis not present

## 2017-08-28 DIAGNOSIS — K8500 Idiopathic acute pancreatitis without necrosis or infection: Secondary | ICD-10-CM

## 2017-08-28 DIAGNOSIS — G43709 Chronic migraine without aura, not intractable, without status migrainosus: Secondary | ICD-10-CM | POA: Diagnosis not present

## 2017-08-28 DIAGNOSIS — F209 Schizophrenia, unspecified: Secondary | ICD-10-CM | POA: Diagnosis not present

## 2017-08-28 DIAGNOSIS — J4540 Moderate persistent asthma, uncomplicated: Secondary | ICD-10-CM | POA: Diagnosis not present

## 2017-08-31 ENCOUNTER — Encounter (HOSPITAL_COMMUNITY): Payer: Self-pay | Admitting: *Deleted

## 2017-08-31 ENCOUNTER — Emergency Department (HOSPITAL_COMMUNITY)
Admission: EM | Admit: 2017-08-31 | Discharge: 2017-09-01 | Disposition: A | Discharge status: Home / Self Care | Payer: PPO | Attending: Emergency Medicine | Admitting: Emergency Medicine

## 2017-08-31 DIAGNOSIS — F250 Schizoaffective disorder, bipolar type: Secondary | ICD-10-CM | POA: Diagnosis not present

## 2017-08-31 DIAGNOSIS — F329 Major depressive disorder, single episode, unspecified: Secondary | ICD-10-CM | POA: Insufficient documentation

## 2017-08-31 DIAGNOSIS — F430 Acute stress reaction: Secondary | ICD-10-CM | POA: Diagnosis present

## 2017-08-31 DIAGNOSIS — R45851 Suicidal ideations: Secondary | ICD-10-CM | POA: Insufficient documentation

## 2017-08-31 DIAGNOSIS — F4310 Post-traumatic stress disorder, unspecified: Secondary | ICD-10-CM | POA: Diagnosis present

## 2017-08-31 LAB — COMPREHENSIVE METABOLIC PANEL
ALBUMIN: 4.2 g/dL (ref 3.5–5.0)
ALT: 15 U/L (ref 0–44)
AST: 18 U/L (ref 15–41)
Alkaline Phosphatase: 77 U/L (ref 38–126)
Anion gap: 7 (ref 5–15)
BUN: 11 mg/dL (ref 6–20)
CHLORIDE: 112 mmol/L — AB (ref 98–111)
CO2: 22 mmol/L (ref 22–32)
Calcium: 9.1 mg/dL (ref 8.9–10.3)
Creatinine, Ser: 0.98 mg/dL (ref 0.44–1.00)
GFR calc Af Amer: >60 mL/min (ref >60)
GFR calc non Af Amer: >60 mL/min (ref >60)
Glucose, Bld: 100 mg/dL — ABNORMAL HIGH (ref 70–99)
POTASSIUM: 3.7 mmol/L (ref 3.5–5.1)
SODIUM: 141 mmol/L (ref 135–145)
Total Bilirubin: 0.1 mg/dL — ABNORMAL LOW (ref 0.3–1.2)
Total Protein: 7.5 g/dL (ref 6.5–8.1)

## 2017-08-31 LAB — ACETAMINOPHEN LEVEL: Acetaminophen (Tylenol), Serum: <10 ug/mL — ABNORMAL LOW (ref 10–30)

## 2017-08-31 LAB — I-STAT BETA HCG BLOOD, ED (MC, WL, AP ONLY)

## 2017-08-31 LAB — CBC
HCT: 42.3 % (ref 36.0–46.0)
Hemoglobin: 13.9 g/dL (ref 12.0–15.0)
MCH: 28.5 pg (ref 26.0–34.0)
MCHC: 32.9 g/dL (ref 30.0–36.0)
MCV: 86.9 fL (ref 78.0–100.0)
PLATELETS: 222 10*3/uL (ref 150–400)
RBC: 4.87 MIL/uL (ref 3.87–5.11)
RDW: 12.7 % (ref 11.5–15.5)
WBC: 8.6 10*3/uL (ref 4.0–10.5)

## 2017-08-31 LAB — SALICYLATE LEVEL: Salicylate Lvl: <7 mg/dL (ref 2.8–30.0)

## 2017-08-31 LAB — ETHANOL

## 2017-08-31 MED ORDER — RISPERIDONE 2 MG PO TABS
2.0000 mg | ORAL_TABLET | Freq: Three times a day (TID) | ORAL | Status: DC
  Administered 2017-08-31 – 2017-09-01 (×2): 2 mg via ORAL
  Filled 2017-08-31 (×2): qty 1

## 2017-08-31 MED ORDER — PRAZOSIN HCL 1 MG PO CAPS
2.0000 mg | ORAL_CAPSULE | Freq: Two times a day (BID) | ORAL | Status: DC
  Administered 2017-08-31 – 2017-09-01 (×2): 2 mg via ORAL
  Filled 2017-08-31 (×2): qty 2

## 2017-08-31 MED ORDER — TOPIRAMATE 100 MG PO TABS
100.0000 mg | ORAL_TABLET | Freq: Every day | ORAL | Status: DC
  Administered 2017-08-31: 100 mg via ORAL
  Filled 2017-08-31: qty 1

## 2017-08-31 MED ORDER — SERTRALINE HCL 50 MG PO TABS
150.0000 mg | ORAL_TABLET | Freq: Every day | ORAL | Status: DC
  Administered 2017-08-31 – 2017-09-01 (×2): 150 mg via ORAL
  Filled 2017-08-31 (×2): qty 3

## 2017-08-31 MED ORDER — LORAZEPAM 1 MG PO TABS
1.0000 mg | ORAL_TABLET | Freq: Once | ORAL | Status: AC
  Administered 2017-08-31: 1 mg via ORAL
  Filled 2017-08-31: qty 1

## 2017-08-31 MED ORDER — HYDROXYZINE HCL 25 MG PO TABS
50.0000 mg | ORAL_TABLET | Freq: Every day | ORAL | Status: DC
  Administered 2017-08-31: 50 mg via ORAL
  Filled 2017-08-31: qty 2

## 2017-08-31 MED ORDER — LEVETIRACETAM 500 MG PO TABS
500.0000 mg | ORAL_TABLET | Freq: Two times a day (BID) | ORAL | Status: DC
  Administered 2017-08-31 – 2017-09-01 (×2): 500 mg via ORAL
  Filled 2017-08-31 (×2): qty 1

## 2017-08-31 NOTE — ED Notes (Signed)
Patient approached nursing station with complaints of anxiety and racing thoughts. Patient noted to be tearful while talking. Provider notified of complaints; new orders received. Patient given Ativan po as ordered. No distress noted.

## 2017-08-31 NOTE — ED Notes (Signed)
Patient states she is unable to give a urine sample at this time

## 2017-08-31 NOTE — ED Notes (Signed)
Bed: [facility] Expected date:  Expected time:  Means of arrival:  Comments: 

## 2017-08-31 NOTE — ED Notes (Addendum)
Pt arrived to unit and is noted to be pleasant and cooperative with care. Pt states "My mother-in-law is putting me down all the time and telling me I will never be a good mom and I will never ever get any custody". Patient states she is having passive SI with plans to overdose. Patient agrees verbally to not harm herself while here. No distress noted currently.

## 2017-08-31 NOTE — ED Notes (Signed)
Patient approaching nursing station with complaints of anger and anxiety stating "I just keep thinking about everything my mother-in-law has said".

## 2017-08-31 NOTE — ED Notes (Signed)
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
## 2017-08-31 NOTE — ED Triage Notes (Signed)
Pt reports suicidal thoughts today since getting into argument with mother in law about visiting her daughter. Pt states she would overdose on pills.

## 2017-08-31 NOTE — ED Notes (Signed)
Bed: [facility]34 Expected date:  Expected time:  Means of arrival:  Comments: 

## 2017-08-31 NOTE — ED Provider Notes (Signed)
[facility] DEPT Provider Note   CSN: [id number] Arrival date & time: 08/31/17  1833     History   Chief Complaint Chief Complaint  Patient presents with  . Suicidal    HPI [name] is a 27 y.o. female with history of migraines, seizures, asthma, schizoaffective disorder is here for recent increased stress at home.  States that she has been getting into arguments with her mother-in-law regarding her daughter and custody of her daughter.  Today she got into an argument with her mother-in-law who kept telling her that "you do not care by her daughter", and that she will never get custody of her.  Patient then began to think that if she cannot have custody of her daughter then there is no point in living anymore.  States that she is stressed and wants a break from everything.  She feels like she needs to stay in the hospital to figure her stuff out and get her medications adjusted.  She is denying active suicidal ideation, homicidal ideation, auditory or visual hallucinations.  Denies illicit drug use.  HPI  Past Medical History:  Diagnosis Date  . Acid reflux   . Asthma    last attack 03/13/15 or 03/14/15  . Autism   . 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 Chronic constipation   . Depression   . Drug-seeking behavior   . Essential tremor   . Headache   . Overdose of acetaminophen 07/2017   and other meds  . Personality disorder ([place])   . Schizo-affective psychosis ([place])   . Schizoaffective disorder, bipolar type ([place])   . Seizures [facility])    Last seizure December 2017    Patient Active Problem List   Diagnosis Date Noted  . Drug overdose 07/22/2017  . Overdose 07/22/2017  . Intentional acetaminophen overdose ([place])   . Metabolic acidosis   . Pancreatitis, acute 06/30/2017  . Acute pancreatitis 06/30/2017  . DUB (dysfunctional uterine bleeding) 11/22/2016  . Hyperprolactinemia ([place]) 08/20/2016  . Contraceptive management 08/09/2016  .  Tachycardia 01/13/2016  . 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  . Seizure disorder ([place]) 08/08/2015  . Chronic migraine 07/27/2015  . Asthma 04/15/2015  . Schizoaffective disorder, bipolar type ([place]) 03/10/2014  . Borderline personality disorder ([place]) 03/10/2014  . PTSD (post-traumatic stress disorder) 03/10/2014  . Autism spectrum disorder 06/15/2013    Past Surgical History:  Procedure Laterality Date  . MOUTH SURGERY  2009 or 2010     OB History    Gravida  2   Para  1   Term  1   Preterm      AB  1   Living  1     SAB  1   TAB      Ectopic      Multiple  0   Live Births  1            Home Medications    Prior to Admission medications   Medication Sig Start Date End Date Taking? Authorizing Provider  albuterol (PROVENTIL HFA;VENTOLIN HFA) 108 (90 Base) MCG/ACT inhaler Inhale 1-2 puffs into the lungs every 6 (six) hours as needed for wheezing or shortness of breath.   Yes [provider]  fluticasone-salmeterol (ADVAIR HFA) 115-21 MCG/ACT inhaler Inhale 2 puffs into the lungs 2 (two) times daily. 08/03/15  Yes Timmothy Euler, MD  hydrOXYzine (ATARAX/VISTARIL) 50 MG tablet Take 50 mg by mouth at bedtime. 08/15/17  Yes [provider]  levETIRAcetam (KEPPRA) 500 MG tablet Take 1 tablet (500 mg total) by mouth 2 (two) times daily. 07/30/17  Yes Clapacs, Madie Reno, MD  prazosin (MINIPRESS) 2 MG capsule Take 1 capsule (2 mg total) by mouth 2 (two) times daily. 07/30/17  Yes Clapacs, Madie Reno, MD  risperiDONE (RISPERDAL) 2 MG tablet Take 1 tablet (2 mg total) by mouth 3 (three) times daily. 07/30/17  Yes Clapacs, Madie Reno, MD  sertraline (ZOLOFT) 100 MG tablet Take 1.5 tablets (150 mg total) by mouth daily. 07/31/17  Yes Clapacs, Madie Reno, MD  topiramate (TOPAMAX) 100 MG tablet Take 1 tablet (100 mg total) by mouth at bedtime. 07/30/17  Yes Clapacs, Madie Reno, MD    Family History Family History  Problem Relation Age of Onset  . Mental illness Father    . Asthma Father   . PDD Brother   . Seizures Brother     Social History Social History   Tobacco Use  . Smoking status: Former Smoker    Packs/day: 0.00  . Smokeless tobacco: Never Used  . Tobacco comment: Smoked for 2  years age [age]  Substance Use Topics  . Alcohol use: No    Alcohol/week: 0.6 oz    Types: 1 Standard drinks or equivalent per week  . Drug use: No    Comment: History of cocaine use at age 1 for 4 months     Allergies   Bee venom; Coconut flavor; Geodon [ziprasidone hcl]; Haloperidol and related; Lithium; Oxycodone; Seroquel [quetiapine fumarate]; Shellfish allergy; Phenergan [promethazine hcl]; Prilosec [omeprazole]; Sulfa antibiotics; and Tegretol [carbamazepine]   Review of Systems Review of Systems  Psychiatric/Behavioral: Positive for dysphoric mood. The patient is nervous/anxious.        Transient thought of not wanting to live  All other systems reviewed and are negative.    Physical Exam Updated Vital Signs BP 115/69 (BP Location: Left Arm)   Pulse 100   Temp 98.3 F (36.8 C) (Oral)   Resp 16   SpO2 98%   Physical Exam  Constitutional: She is oriented to person, place, and time. She appears well-developed and well-nourished. No distress.  NAD.  HENT:  Head: Normocephalic and atraumatic.  Right Ear: External ear normal.  Left Ear: External ear normal.  Nose: Nose normal.  Eyes: Conjunctivae and EOM are normal. No scleral icterus.  Neck: Normal range of motion. Neck supple.  Cardiovascular: Normal rate, regular rhythm and normal heart sounds.  No murmur heard. Pulmonary/Chest: Effort normal and breath sounds normal. She has no wheezes.  Musculoskeletal: Normal range of motion. She exhibits no deformity.  Neurological: She is alert and oriented to person, place, and time.  Skin: Skin is warm and dry. Capillary refill takes less than 2 seconds.  Psychiatric: Her behavior is normal.  Teary eyed, look anxious. No active HI, SI, AVH    Nursing note and vitals reviewed.    ED Treatments / Results  Labs (all labs ordered are listed, but only abnormal results are displayed) Labs Reviewed  COMPREHENSIVE METABOLIC PANEL - Abnormal; Notable for the following components:      Result Value   Chloride 112 (*)    Glucose, Bld 100 (*)    Total Bilirubin 0.1 (*)    All other components within normal limits  ACETAMINOPHEN LEVEL - Abnormal; Notable for the following components:   Acetaminophen (Tylenol), Serum <10 (*)    All other components within normal limits  ETHANOL  SALICYLATE LEVEL  CBC  RAPID  URINE DRUG SCREEN, HOSP PERFORMED  I-STAT BETA HCG BLOOD, ED ([facility], [facility], [facility] ONLY)    EKG None  Radiology No results found.  Procedures Procedures (including critical care time)  Medications Ordered in ED Medications  hydrOXYzine (ATARAX/VISTARIL) tablet 50 mg (50 mg Oral Given 08/31/17 2022)  levETIRAcetam (KEPPRA) tablet 500 mg (500 mg Oral Given 08/31/17 2022)  prazosin (MINIPRESS) capsule 2 mg (2 mg Oral Given 08/31/17 2108)  risperiDONE (RISPERDAL) tablet 2 mg (2 mg Oral Given 08/31/17 2022)  sertraline (ZOLOFT) tablet 150 mg (150 mg Oral Given 08/31/17 2022)  topiramate (TOPAMAX) tablet 100 mg (100 mg Oral Given 08/31/17 2024)  LORazepam (ATIVAN) tablet 1 mg (1 mg Oral Given 08/31/17 2201)     Initial Impression / Assessment and Plan / ED Course  I have reviewed the triage vital signs and the nursing notes.  Pertinent labs & imaging results that were available during my care of the patient were reviewed by me and considered in my medical decision making (see chart for details).    Patient here with transient thoughts of not wanting to live.  Known family stressors.  Has long history of psychiatric illness and has been missing some of her medications in the last few days.  Denies illicit drug use or EtOH abuse.  Exam is otherwise unremarkable.  Patient is anxious appearing but denies any active SI, HI and AVH.  Has  been calm cooperative in the ER.  Labs reviewed and WNL.  She is medically cleared for TTS evaluation.  Sitter at bedside.  Home medications ordered.  Discussed ED course and upcoming psych evaluation with patient who is agreeable to stay voluntarily.   Final Clinical Impressions(s) / ED Diagnoses   Final diagnoses:  Suicidal ideation    ED Discharge Orders    None       Arlean Hopping 08/31/17 2233    Julianne Rice, MD 09/07/17 [phone]

## 2017-09-01 DIAGNOSIS — F250 Schizoaffective disorder, bipolar type: Secondary | ICD-10-CM | POA: Diagnosis not present
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
## 2017-09-01 NOTE — [facility] Suicide Risk Assessment (Signed)
Suicide Risk Assessment  Discharge Assessment   [facility] Discharge Suicide Risk Assessment   Principal Problem: Schizoaffective disorder, bipolar type [facility]) Discharge Diagnoses:  Patient Active Problem List   Diagnosis Date Noted  . Schizoaffective disorder, bipolar type ([place]) [F25.0] 03/10/2014    Priority: High  . PTSD (post-traumatic stress disorder) [F43.10] 03/10/2014    Priority: High  . Drug overdose [T50.901A] 07/22/2017  . Overdose [T50.901A] 07/22/2017  . Intentional acetaminophen overdose ([place]) [T39.1X2A]   . Metabolic acidosis [V42.5]   . Pancreatitis, acute [K85.90] 06/30/2017  . Acute pancreatitis [K85.90] 06/30/2017  . DUB (dysfunctional uterine bleeding) [N93.8] 11/22/2016  . Hyperprolactinemia ([place]) [E22.1] 08/20/2016  . Contraceptive management [Z30.9] 08/09/2016  . Tachycardia [R00.0] 01/13/2016  . 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 [Z14.8] 09/08/2015  . Seizure disorder ([place]) [Z56.387] 08/08/2015  . Chronic migraine [G43.709] 07/27/2015  . Asthma [J45.909] 04/15/2015  . Borderline personality disorder ([place]) [F60.3] 03/10/2014  . Autism spectrum disorder [F84.0] 06/15/2013    Total Time spent with patient: 45 minutes  Musculoskeletal: Strength & Muscle Tone: within normal limits Gait & Station: normal Patient leans: N/A  Psychiatric Specialty Exam: Physical Exam  Nursing note and vitals reviewed. Constitutional: She is oriented to person, place, and time. She appears well-developed and well-nourished.  HENT:  Head: Normocephalic.  Neck: Normal range of motion.  Respiratory: Effort normal.  Musculoskeletal: Normal range of motion.  Neurological: She is alert and oriented to person, place, and time.  Psychiatric: Her speech is normal and behavior is normal. Judgment and thought content normal. Her mood appears anxious. Her affect is blunt. Cognition and memory are normal.    Review of Systems  Psychiatric/Behavioral: The patient is nervous/anxious.   All  other systems reviewed and are negative.   Blood pressure 113/63, pulse 81, temperature 97.8 F (36.6 C), temperature source Oral, resp. rate 18, SpO2 98 %, not currently breastfeeding.There is no height or weight on file to calculate BMI.  General Appearance: Casual  Eye Contact:  Good  Speech:  Normal Rate  Volume:  Normal  Mood:  Anxious, mild  Affect:  Blunt  Thought Process:  Coherent and Descriptions of Associations: Intact  Orientation:  Full (Time, Place, and Person)  Thought Content:  WDL and Logical  Suicidal Thoughts:  No  Homicidal Thoughts:  No  Memory:  Immediate;   Good Recent;   Good Remote;   Good  Judgement:  Fair  Insight:  Fair  Psychomotor Activity:  Normal  Concentration:  Concentration: Good and Attention Span: Good  Recall:  Good  Fund of Knowledge:  Good  Language:  Good  Akathisia:  No  Handed:  Right  AIMS (if indicated):     Assets:  Leisure Time Physical Health Resilience Social Support  ADL's:  Intact  Cognition:  WNL  Sleep:       Mental Status Per Nursing Assessment::   On Admission:   suicidal ideations  Demographic Factors:  Adolescent or young adult and Caucasian  Loss Factors: NA  Historical Factors: NA  Risk Reduction Factors:   Sense of responsibility to family, Living with another person, especially a relative, Positive social support and Positive therapeutic relationship  Continued Clinical Symptoms:  Anxiety, mild  Cognitive Features That Contribute To Risk:  None    Suicide Risk:  Minimal: No identifiable suicidal ideation.  Patients presenting with no risk factors but with morbid ruminations; may be classified as minimal risk based on the severity of the depressive symptoms    Plan  Of Care/Follow-up recommendations:  Activity:  as tolerated Diet:  heart healthy diet  LORD, JAMISON, NP 09/01/2017, 1:13 PM

## 2017-09-01 NOTE — Progress Notes (Signed)
CSW made aware of patient's readiness for discharge and patient's involvement with [employer]. CSW left [place] [facility] [phone]) a voicemail informing clinician of patient's discharge.  CSW signing off.  Stephanie Acre, [place] Social Worker [phone]

## 2017-09-01 NOTE — ED Notes (Signed)
After hearing that she was not going to be admitted patient was irritable and was rocking her headboard.  Tech Reggie went in and talked with her and now she is calm lying with the blanket over her head.

## 2017-09-03 ENCOUNTER — Encounter: Payer: Self-pay | Admitting: Obstetrics and Gynecology

## 2017-09-03 ENCOUNTER — Ambulatory Visit (INDEPENDENT_AMBULATORY_CARE_PROVIDER_SITE_OTHER): Payer: PPO | Admitting: Obstetrics and Gynecology

## 2017-09-03 VITALS — BP 109/66 | HR 110 | Wt 179.0 lb

## 2017-09-03 DIAGNOSIS — N938 Other specified abnormal uterine and vaginal bleeding: Secondary | ICD-10-CM

## 2017-09-03 MED ORDER — PROGESTERONE MICRONIZED 200 MG PO CAPS: 200.0000 mg | ORAL_CAPSULE | Freq: Every day | ORAL | 3 refills | Status: DC

## 2017-09-03 NOTE — Progress Notes (Signed)
[name] presents for of her DUB. She presently on [place]. She had regular cycles every 3 months while taking. She however stop taking in March. Does not want to take contraception anymore Last cycle in March.  PE AF VSS Lungs clear Heart RRR Abd soft + BS  A/P DUB  Will start Prometrium 200 mg qd first 10 days of each month. To start August 1. U/R/B reviewed F/U in 6 months

## 2017-09-04 ENCOUNTER — Emergency Department (HOSPITAL_COMMUNITY)
Admission: EM | Admit: 2017-09-04 | Discharge: 2017-09-05 | Disposition: A | Discharge status: Other Acute Inpatient Hospital | Payer: PPO | Attending: Emergency Medicine | Admitting: Emergency Medicine

## 2017-09-04 ENCOUNTER — Encounter (HOSPITAL_COMMUNITY): Payer: Self-pay | Admitting: Emergency Medicine

## 2017-09-04 ENCOUNTER — Other Ambulatory Visit: Payer: Self-pay

## 2017-09-04 DIAGNOSIS — F250 Schizoaffective disorder, bipolar type: Secondary | ICD-10-CM

## 2017-09-04 DIAGNOSIS — S50812A Abrasion of left forearm, initial encounter: Secondary | ICD-10-CM | POA: Diagnosis not present

## 2017-09-04 DIAGNOSIS — T1491XA Suicide attempt, initial encounter: Secondary | ICD-10-CM | POA: Diagnosis not present

## 2017-09-04 DIAGNOSIS — Z87891 Personal history of nicotine dependence: Secondary | ICD-10-CM | POA: Insufficient documentation

## 2017-09-04 DIAGNOSIS — Z79899 Other long term (current) drug therapy: Secondary | ICD-10-CM | POA: Insufficient documentation

## 2017-09-04 DIAGNOSIS — R4588 Nonsuicidal self-harm: Secondary | ICD-10-CM

## 2017-09-04 DIAGNOSIS — F329 Major depressive disorder, single episode, unspecified: Secondary | ICD-10-CM | POA: Diagnosis present

## 2017-09-04 DIAGNOSIS — J45909 Unspecified asthma, uncomplicated: Secondary | ICD-10-CM | POA: Diagnosis not present

## 2017-09-04 DIAGNOSIS — F29 Unspecified psychosis not due to a substance or known physiological condition: Secondary | ICD-10-CM | POA: Diagnosis not present

## 2017-09-04 DIAGNOSIS — R4589 Other symptoms and signs involving emotional state: Secondary | ICD-10-CM

## 2017-09-04 LAB — CBC WITH DIFFERENTIAL/PLATELET
BASOS PCT: 0 %
Basophils Absolute: 0 10*3/uL (ref 0.0–0.1)
EOS ABS: 0.1 10*3/uL (ref 0.0–0.7)
Eosinophils Relative: 1 %
HCT: 41.6 % (ref 36.0–46.0)
Hemoglobin: 13.9 g/dL (ref 12.0–15.0)
Lymphocytes Relative: 26 %
Lymphs Abs: 2.5 10*3/uL (ref 0.7–4.0)
MCH: 28 pg (ref 26.0–34.0)
MCHC: 33.4 g/dL (ref 30.0–36.0)
MCV: 83.7 fL (ref 78.0–100.0)
MONO ABS: 0.6 10*3/uL (ref 0.1–1.0)
MONOS PCT: 6 %
NEUTROS PCT: 67 %
Neutro Abs: 6.5 10*3/uL (ref 1.7–7.7)
Platelets: 239 10*3/uL (ref 150–400)
RBC: 4.97 MIL/uL (ref 3.87–5.11)
RDW: 12.7 % (ref 11.5–15.5)
WBC: 9.6 10*3/uL (ref 4.0–10.5)

## 2017-09-04 LAB — PREGNANCY, URINE: Preg Test, Ur: NEGATIVE

## 2017-09-04 LAB — URINALYSIS, ROUTINE W REFLEX MICROSCOPIC
BILIRUBIN URINE: NEGATIVE
Glucose, UA: NEGATIVE mg/dL
Hgb urine dipstick: NEGATIVE
Ketones, ur: NEGATIVE mg/dL
NITRITE: NEGATIVE
PH: 5 (ref 5.0–8.0)
Protein, ur: NEGATIVE mg/dL
Specific Gravity, Urine: 1.016 (ref 1.005–1.030)

## 2017-09-04 LAB — RAPID URINE DRUG SCREEN, HOSP PERFORMED
Amphetamines: NOT DETECTED
Barbiturates: NOT DETECTED
Benzodiazepines: NOT DETECTED
Cocaine: NOT DETECTED
Opiates: NOT DETECTED
Tetrahydrocannabinol: NOT DETECTED

## 2017-09-04 LAB — BASIC METABOLIC PANEL
Anion gap: 9 (ref 5–15)
BUN: 16 mg/dL (ref 6–20)
CALCIUM: 9.1 mg/dL (ref 8.9–10.3)
CO2: 22 mmol/L (ref 22–32)
CREATININE: 0.86 mg/dL (ref 0.44–1.00)
Chloride: 112 mmol/L — ABNORMAL HIGH (ref 98–111)
GFR calc non Af Amer: >60 mL/min (ref >60)
Glucose, Bld: 99 mg/dL (ref 70–99)
Potassium: 3.5 mmol/L (ref 3.5–5.1)
SODIUM: 143 mmol/L (ref 135–145)

## 2017-09-04 LAB — ACETAMINOPHEN LEVEL

## 2017-09-04 LAB — SALICYLATE LEVEL

## 2017-09-04 LAB — ETHANOL: Alcohol, Ethyl (B): <10 mg/dL (ref <10)

## 2017-09-04 MED ORDER — SERTRALINE HCL 50 MG PO TABS
150.0000 mg | ORAL_TABLET | Freq: Every day | ORAL | Status: DC
  Administered 2017-09-05: 150 mg via ORAL
  Filled 2017-09-04: qty 3

## 2017-09-04 MED ORDER — HYDROXYZINE HCL 25 MG PO TABS
50.0000 mg | ORAL_TABLET | Freq: Four times a day (QID) | ORAL | Status: DC | PRN
  Administered 2017-09-05 (×2): 50 mg via ORAL
  Filled 2017-09-04 (×2): qty 2

## 2017-09-04 MED ORDER — TRAZODONE HCL 50 MG PO TABS
50.0000 mg | ORAL_TABLET | Freq: Every evening | ORAL | Status: DC | PRN
  Administered 2017-09-05: 50 mg via ORAL
  Filled 2017-09-04: qty 1

## 2017-09-04 MED ORDER — TOPIRAMATE 100 MG PO TABS
100.0000 mg | ORAL_TABLET | Freq: Every day | ORAL | Status: DC
  Administered 2017-09-05: 100 mg via ORAL
  Filled 2017-09-04: qty 1

## 2017-09-04 MED ORDER — RISPERIDONE 2 MG PO TABS
2.0000 mg | ORAL_TABLET | Freq: Three times a day (TID) | ORAL | Status: DC
  Administered 2017-09-05 (×2): 2 mg via ORAL
  Filled 2017-09-04 (×2): qty 1

## 2017-09-04 MED ORDER — PRAZOSIN HCL 1 MG PO CAPS
2.0000 mg | ORAL_CAPSULE | Freq: Two times a day (BID) | ORAL | Status: DC
  Administered 2017-09-05 (×2): 2 mg via ORAL
  Filled 2017-09-04 (×3): qty 2

## 2017-09-04 NOTE — ED Triage Notes (Addendum)
Pt arriving via [facility]. Pt reports she had been to hospital 4 times and they would not see her so she cut herself on her left wrist. Pt recently seen on 7/27 for suicidal ideation. Pt brought multiple bags with her demanding to be admitted. Pt reports she has been having suicidal ideation for the last 4 months.

## 2017-09-04 NOTE — ED Provider Notes (Addendum)
[facility] DEPT Provider Note  CSN: [id number] Arrival date & time: 09/04/17  2106    History   Chief Complaint Chief Complaint  Patient presents with  . Suicidal    HPI [name] is a 27 y.o. female with a psychiatric history of schizoaffective disorder, substance use, PTSD and autism and  medical history of asthma and migraines who presented to the ED for self-harming behaviors. Patient states "I want to come in the hospital for 1 week so I can go to groups and get treatment." She states that she has been to the ED 4 times this month, but they would not admit her. Patient states that she cut her wrist with a razor blade so she will be admitted. Patient states "I don't want to die or kill myself." She states that she is unhappy with her [facility] who follows her regularly.  Patient has no specific physical complaints today. Denies SI, HI and AVH.  Past Medical History:  Diagnosis Date  . Acid reflux   . Asthma    last attack 03/13/15 or 03/14/15  . Autism   . 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 Chronic constipation   . Depression   . Drug-seeking behavior   . Essential tremor   . Headache   . Overdose of acetaminophen 07/2017   and other meds  . Personality disorder ([place])   . Schizo-affective psychosis ([place])   . Schizoaffective disorder, bipolar type ([place])   . Seizures [facility])    Last seizure December 2017    Patient Active Problem List   Diagnosis Date Noted  . Drug overdose 07/22/2017  . Overdose 07/22/2017  . Intentional acetaminophen overdose ([place])   . Metabolic acidosis   . Pancreatitis, acute 06/30/2017  . Acute pancreatitis 06/30/2017  . DUB (dysfunctional uterine bleeding) 11/22/2016  . Hyperprolactinemia ([place]) 08/20/2016  . Contraceptive management 08/09/2016  . Tachycardia 01/13/2016  . 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  . Seizure disorder ([place]) 08/08/2015  . Chronic migraine 07/27/2015  . Asthma  04/15/2015  . Schizoaffective disorder, bipolar type ([place]) 03/10/2014  . Borderline personality disorder ([place]) 03/10/2014  . PTSD (post-traumatic stress disorder) 03/10/2014  . Autism spectrum disorder 06/15/2013    Past Surgical History:  Procedure Laterality Date  . MOUTH SURGERY  2009 or 2010     OB History    Gravida  2   Para  1   Term  1   Preterm      AB  1   Living  1     SAB  1   TAB      Ectopic      Multiple  0   Live Births  1            Home Medications    Prior to Admission medications   Medication Sig Start Date End Date Taking? Authorizing Provider  albuterol (PROVENTIL HFA;VENTOLIN HFA) 108 (90 Base) MCG/ACT inhaler Inhale 1-2 puffs into the lungs every 6 (six) hours as needed for wheezing or shortness of breath.   Yes [provider]  fluticasone-salmeterol (ADVAIR HFA) 115-21 MCG/ACT inhaler Inhale 2 puffs into the lungs 2 (two) times daily. 08/03/15  Yes Timmothy Euler, MD  hydrOXYzine (ATARAX/VISTARIL) 50 MG tablet Take 50 mg by mouth at bedtime. 08/15/17  Yes [provider]  levETIRAcetam (KEPPRA) 500 MG tablet Take 1 tablet (500 mg total) by mouth 2 (two) times daily. 07/30/17  Yes Clapacs, Jenny Reichmann  T, MD  prazosin (MINIPRESS) 2 MG capsule Take 1 capsule (2 mg total) by mouth 2 (two) times daily. 07/30/17  Yes Clapacs, Madie Reno, MD  progesterone (PROMETRIUM) 200 MG capsule Take 1 capsule (200 mg total) by mouth daily. 1 tablet first ten days of each month 09/03/17  Yes Chancy Milroy, MD  risperiDONE (RISPERDAL) 2 MG tablet Take 1 tablet (2 mg total) by mouth 3 (three) times daily. 07/30/17  Yes Clapacs, Madie Reno, MD  sertraline (ZOLOFT) 100 MG tablet Take 1.5 tablets (150 mg total) by mouth daily. 07/31/17  Yes Clapacs, Madie Reno, MD  topiramate (TOPAMAX) 100 MG tablet Take 1 tablet (100 mg total) by mouth at bedtime. 07/30/17  Yes Clapacs, Madie Reno, MD    Family History Family History  Problem Relation Age of Onset  . Mental  illness Father   . Asthma Father   . PDD Brother   . Seizures Brother     Social History Social History   Tobacco Use  . Smoking status: Former Smoker    Packs/day: 0.00  . Smokeless tobacco: Never Used  . Tobacco comment: Smoked for 2  years age [age]  Substance Use Topics  . Alcohol use: No    Alcohol/week: 0.6 oz    Types: 1 Standard drinks or equivalent per week  . Drug use: No    Comment: History of cocaine use at age 17 for 4 months     Allergies   Bee venom; Coconut flavor; Geodon [ziprasidone hcl]; Haloperidol and related; Lithium; Oxycodone; Seroquel [quetiapine fumarate]; Shellfish allergy; Phenergan [promethazine hcl]; Prilosec [omeprazole]; Sulfa antibiotics; and Tegretol [carbamazepine]   Review of Systems Review of Systems  Constitutional: Negative.   Musculoskeletal: Negative.   Skin: Negative.   Neurological: Negative.   Psychiatric/Behavioral: Positive for behavioral problems and self-injury. Negative for sleep disturbance and suicidal ideas.     Physical Exam Updated Vital Signs Ht 5\' 5"  (1.651 m)   Wt 81.2 kg (179 lb)   SpO2 99%   BMI 29.79 kg/m   Physical Exam  Constitutional: She is oriented to person, place, and time. She appears well-developed and well-nourished.  Cardiovascular: Normal rate, regular rhythm and normal heart sounds.  Pulmonary/Chest: Effort normal and breath sounds normal.  Abdominal: Soft. Bowel sounds are normal. There is no tenderness.  Musculoskeletal: She exhibits no tenderness or deformity.  Neurological: She is alert and oriented to person, place, and time.  Skin: Skin is warm. Capillary refill takes less than 2 seconds. Abrasion noted.     2 superficial razor marks on anterior aspect of left forearm.  Psychiatric: She has a normal mood and affect. Her speech is normal. She is slowed. She expresses impulsivity. She expresses no suicidal ideation. She expresses no suicidal plans.  Nursing note and vitals  reviewed.    ED Treatments / Results  Labs (all labs ordered are listed, but only abnormal results are displayed) Labs Reviewed  BASIC METABOLIC PANEL - Abnormal; Notable for the following components:      Result Value   Chloride 112 (*)    All other components within normal limits  URINALYSIS, ROUTINE W REFLEX MICROSCOPIC - Abnormal; Notable for the following components:   APPearance HAZY (*)    Leukocytes, UA TRACE (*)    Bacteria, UA FEW (*)    All other components within normal limits  ACETAMINOPHEN LEVEL - Abnormal; Notable for the following components:   Acetaminophen (Tylenol), Serum <10 (*)    All other components within  normal limits  CBC WITH DIFFERENTIAL/PLATELET  ETHANOL  SALICYLATE LEVEL  RAPID URINE DRUG SCREEN, HOSP PERFORMED  PREGNANCY, URINE    EKG None  Radiology No results found.  Procedures Procedures (including critical care time)  Medications Ordered in ED Medications - No data to display   Initial Impression / Assessment and Plan / ED Course  Triage vital signs and the nursing notes have been reviewed.  Pertinent labs & imaging results that were available during care of the patient were reviewed and considered in medical decision making (see chart for details).   Patient presents to the ED citing SI, but openly admits that she is not suicidal and has no plan. She also openly admits to cutting herself with a razor in hopes of getting admitted. Patient does not endorse depression or new depressive symptoms, but discusses her displeasure with her current ACTT. Attention seeking behavior displayed today is consistent with cluster B personality characteristics which patient has presented with in the past. At this time, patient does not meet criteria for IVC or inpatient psychiatric hospitalization as she is not a danger to herself or others and has established consistent care with an outpatient psychiatric provider. Case discussed with TTS who is  familiar with the patient and will see her in the ED.  From a medical perspective, patient is cleared. Patient has elevated chloride, but is consistent with her past labs where baseline chloride is > 110. Physical exam is normal. Superficial cuts on patient's left forearm do not require repair.  Final Clinical Impressions(s) / ED Diagnoses  1. Self-Harming Behaviors. Denies SI, intent or plan. Patient has no acute psychiatric needs that warrant inpatient psychiatric hospitalization at this time. Pt's home psych meds ordered.  Dispo: Consult placed with TTS for appropriate dispo and outpatient planning.  Final diagnoses:  Non-suicidal self harm    ED Discharge Orders    None        Romie Jumper, PA-C 09/04/17 2336    Junita Push 09/04/17 2338    Charlesetta Shanks, MD 09/05/17 [phone]

## 2017-09-05 ENCOUNTER — Other Ambulatory Visit: Payer: Self-pay

## 2017-09-05 DIAGNOSIS — Z62810 Personal history of physical and sexual abuse in childhood: Secondary | ICD-10-CM | POA: Diagnosis not present

## 2017-09-05 DIAGNOSIS — F250 Schizoaffective disorder, bipolar type: Secondary | ICD-10-CM | POA: Diagnosis not present

## 2017-09-05 DIAGNOSIS — E669 Obesity, unspecified: Secondary | ICD-10-CM | POA: Diagnosis not present

## 2017-09-05 DIAGNOSIS — R45851 Suicidal ideations: Secondary | ICD-10-CM | POA: Diagnosis not present

## 2017-09-05 DIAGNOSIS — Z6830 Body mass index (BMI) 30.0-30.9, adult: Secondary | ICD-10-CM | POA: Diagnosis not present

## 2017-09-05 DIAGNOSIS — Z9114 Patient's other noncompliance with medication regimen: Secondary | ICD-10-CM | POA: Diagnosis not present

## 2017-09-05 DIAGNOSIS — J45909 Unspecified asthma, uncomplicated: Secondary | ICD-10-CM | POA: Diagnosis not present

## 2017-09-05 DIAGNOSIS — F332 Major depressive disorder, recurrent severe without psychotic features: Secondary | ICD-10-CM | POA: Diagnosis not present

## 2017-09-05 DIAGNOSIS — G43909 Migraine, unspecified, not intractable, without status migrainosus: Secondary | ICD-10-CM | POA: Diagnosis not present

## 2017-09-05 DIAGNOSIS — Z915 Personal history of self-harm: Secondary | ICD-10-CM | POA: Diagnosis not present

## 2017-09-05 DIAGNOSIS — K219 Gastro-esophageal reflux disease without esophagitis: Secondary | ICD-10-CM | POA: Diagnosis not present

## 2017-09-05 DIAGNOSIS — F4310 Post-traumatic stress disorder, unspecified: Secondary | ICD-10-CM | POA: Diagnosis not present

## 2017-09-05 DIAGNOSIS — Z62811 Personal history of psychological abuse in childhood: Secondary | ICD-10-CM | POA: Diagnosis not present

## 2017-09-05 DIAGNOSIS — Z91410 Personal history of adult physical and sexual abuse: Secondary | ICD-10-CM | POA: Diagnosis not present

## 2017-09-05 MED ORDER — TRAZODONE HCL 50 MG PO TABS
50.0000 mg | ORAL_TABLET | Freq: Once | ORAL | Status: AC
  Administered 2017-09-05: 50 mg via ORAL
  Filled 2017-09-05: qty 1

## 2017-09-05 MED ORDER — LEVETIRACETAM 500 MG PO TABS
500.0000 mg | ORAL_TABLET | Freq: Two times a day (BID) | ORAL | Status: DC
  Administered 2017-09-05: 500 mg via ORAL
  Filled 2017-09-05: qty 1

## 2017-09-05 MED ORDER — TRAZODONE HCL 100 MG PO TABS: 100.0000 mg | ORAL_TABLET | Freq: Every day | ORAL | Status: DC

## 2017-09-05 NOTE — ED Notes (Signed)
Patient not sleeping after giving ordered dose of trazodone, risperdal and hydroxyzine. Patient stated that she was feeling violent like she wants to hit things. She is sitting up on end of bed rocking. Notified MD. New order for additional 50mg  trazodone PO. Continue to monitor.

## 2017-09-05 NOTE — ED Notes (Signed)
Pt discharged safely with [place] driver.  Pt was calm and cooperative.  Pt was in no distress at discharge.  All belongings were sent with patient.

## 2017-09-05 NOTE — ED Notes (Addendum)
*  Belongings located in [facility]* Belongings: Large Black duffle bag, black sandals, [employer] Pants & Top, black wallet,  Purse, $10, discover card [phone], [place] ID x5, [place] eWIC [id number], Medicare & Medicaid card, debit [phone], Social security card x2, iPhone with black case, cell phone charger-white, misc. Makeup products (lipstick, concealer, lotion), set of keyes (3 keyes on ring). Belongings itemized on belongings sheet located in pt chart at nurses station. Pt signed belongings sheet acknowledging her understanding that her belongings would remain locked up until discharge.
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
## 2017-09-05 NOTE — ED Notes (Signed)
Patient escorted to TCU. Belongings placed behind the nurses station in [name].

## 2017-09-05 NOTE — BH Assessment (Signed)
[facility] Assessment Progress Note  Per Buford Dresser, DO, this [name] requires psychiatric hospitalization at this time.  At 15:38 [name] calls from [facility].  [name] has been accepted to their facility by Dr Eugenio Hoes, to the [facility].  Evelyn Hal, FNP, concurs with this disposition, as does the [name] who is currently under voluntary status.  [name]'s nurse, [name], has been notified, and agrees to call report to [phone].  [name] is to be transported via [name], [place] Coordinator [phone]

## 2017-09-05 NOTE — ED Notes (Signed)
Pt agitated, scrubbing chair around in room, hitting door jam with it. Pt redirectable, Meds given to help calm pt. She is upset that she may be discharged and does not feel that will help her situation.

## 2017-09-05 NOTE — ED Notes (Signed)
Pt oriented to unit.  Pt is calm and cooperative.  Went straight back to bed.  15 minute checks and video monitoring in place.

## 2017-09-12 ENCOUNTER — Other Ambulatory Visit: Payer: Self-pay | Admitting: *Deleted

## 2017-09-12 NOTE — Patient Outreach (Signed)
[facility]) Care Management  09/12/2017  [name] 08-15-1990 [id number]   Transition of Care Referral   Referral Date: 09/12/17 Referral Source: [facility] urgent outreach referral  Date of Admission: 09/05/17 Diagnosis:  Major depressv disorder, recurrent severe w/o psych features  Date of Discharge: 09/09/17 Facility: [place]: [employer] advantage   Outreach attempt # 1  Outreach attempt to patient x 2. No answer and unable to leave voicemail message.  Plan: [facility] RN CM sent an unsuccessful outreach letter, updated [facility] RN CM, [place] via email and scheduled this patient for another call attempt within 4 business days with [place]. Lavina Hamman, RN, BSN, CCM [facility] Telephonic Care Management Care Coordinator Direct number [phone]  Main [facility] number [phone] Fax number [phone]

## 2017-09-13 ENCOUNTER — Other Ambulatory Visit: Payer: Self-pay

## 2017-09-13 ENCOUNTER — Ambulatory Visit: Payer: Self-pay

## 2017-09-13 NOTE — Patient Outreach (Signed)
[facility]) Care Management  09/13/2017  [name] January 02, 1991 [id number]   Transition of care  Referral date: 09/12/17 Referral source: discharged from [facility] on 09/09/17 Insurance: Health team advantage  Telephone call to patient regarding transition of care referral. HIPAA verified. Explained reason for call.  Patient states she was recently discharged from [facility].  Patient states she had a follow up appointment with her psychiatrist on yesterday 09/12/17.  Patient states she has her medications and takes them as prescribed. Patient states she has transportation to her appointments. Patient denies any new symptoms or concerns at this time.  RNCM discussed and offered ongoing transition of care follow up. Patient declined.  RNCM advised patient to notify MD of any changes in condition prior to scheduled appointment. RNCM provided contact name and number: [phone] or main office number [phone] and 24 hour nurse advise line [phone].  RNCM verified patient aware of 911 services for urgent/ emergent needs.  PLAN: RNCM will close patient due to refusal of services RNCM will send patient  [facility] care management brochure/ magnet RNCM will send closure notification to patients primary Md.   Quinn Plowman RN,BSN,CCM [facility] Telephonic  [phone]  .

## 2017-09-16 DIAGNOSIS — F319 Bipolar disorder, unspecified: Secondary | ICD-10-CM | POA: Diagnosis not present

## 2017-09-30 ENCOUNTER — Other Ambulatory Visit: Payer: Self-pay

## 2017-09-30 ENCOUNTER — Emergency Department (HOSPITAL_COMMUNITY)
Admission: EM | Admit: 2017-09-30 | Discharge: 2017-09-30 | Disposition: A | Discharge status: Home / Self Care | Payer: PPO | Attending: Emergency Medicine | Admitting: Emergency Medicine

## 2017-09-30 ENCOUNTER — Emergency Department (HOSPITAL_COMMUNITY): Payer: PPO

## 2017-09-30 DIAGNOSIS — J45909 Unspecified asthma, uncomplicated: Secondary | ICD-10-CM | POA: Diagnosis not present

## 2017-09-30 DIAGNOSIS — R42 Dizziness and giddiness: Secondary | ICD-10-CM | POA: Diagnosis not present

## 2017-09-30 DIAGNOSIS — R1084 Generalized abdominal pain: Secondary | ICD-10-CM | POA: Diagnosis not present

## 2017-09-30 DIAGNOSIS — R1011 Right upper quadrant pain: Secondary | ICD-10-CM | POA: Diagnosis not present

## 2017-09-30 DIAGNOSIS — Q992 Fragile X chromosome: Secondary | ICD-10-CM | POA: Diagnosis not present

## 2017-09-30 DIAGNOSIS — Z87891 Personal history of nicotine dependence: Secondary | ICD-10-CM | POA: Insufficient documentation

## 2017-09-30 DIAGNOSIS — F840 Autistic disorder: Secondary | ICD-10-CM | POA: Insufficient documentation

## 2017-09-30 DIAGNOSIS — Z79899 Other long term (current) drug therapy: Secondary | ICD-10-CM | POA: Insufficient documentation

## 2017-09-30 DIAGNOSIS — R0789 Other chest pain: Secondary | ICD-10-CM | POA: Diagnosis not present

## 2017-09-30 DIAGNOSIS — R079 Chest pain, unspecified: Secondary | ICD-10-CM | POA: Diagnosis not present

## 2017-09-30 DIAGNOSIS — R531 Weakness: Secondary | ICD-10-CM | POA: Insufficient documentation

## 2017-09-30 DIAGNOSIS — R197 Diarrhea, unspecified: Secondary | ICD-10-CM | POA: Insufficient documentation

## 2017-09-30 LAB — BASIC METABOLIC PANEL
ANION GAP: 9 (ref 5–15)
BUN: 10 mg/dL (ref 6–20)
CO2: 22 mmol/L (ref 22–32)
Calcium: 9.2 mg/dL (ref 8.9–10.3)
Chloride: 108 mmol/L (ref 98–111)
Creatinine, Ser: 0.91 mg/dL (ref 0.44–1.00)
Glucose, Bld: 114 mg/dL — ABNORMAL HIGH (ref 70–99)
Potassium: 3.4 mmol/L — ABNORMAL LOW (ref 3.5–5.1)
Sodium: 139 mmol/L (ref 135–145)

## 2017-09-30 LAB — CBC
HCT: 43.4 % (ref 36.0–46.0)
Hemoglobin: 14 g/dL (ref 12.0–15.0)
MCH: 27.3 pg (ref 26.0–34.0)
MCHC: 32.3 g/dL (ref 30.0–36.0)
MCV: 84.6 fL (ref 78.0–100.0)
Platelets: 216 10*3/uL (ref 150–400)
RBC: 5.13 MIL/uL — AB (ref 3.87–5.11)
RDW: 12 % (ref 11.5–15.5)
WBC: 9.1 10*3/uL (ref 4.0–10.5)

## 2017-09-30 LAB — I-STAT TROPONIN, ED: TROPONIN I, POC: 0.03 ng/mL (ref 0.00–0.08)

## 2017-09-30 LAB — I-STAT BETA HCG BLOOD, ED (MC, WL, AP ONLY): I-stat hCG, quantitative: <5 m[IU]/mL (ref <5)

## 2017-09-30 LAB — LIPASE, BLOOD: LIPASE: 45 U/L (ref 11–51)

## 2017-09-30 MED ORDER — KETOROLAC TROMETHAMINE 15 MG/ML IJ SOLN: 15.0000 mg | Freq: Once | INTRAMUSCULAR | Status: DC

## 2017-09-30 MED ORDER — SODIUM CHLORIDE 0.9 % IV BOLUS
1000.0000 mL | Freq: Once | INTRAVENOUS | Status: AC
  Administered 2017-09-30: 1000 mL via INTRAVENOUS

## 2017-09-30 MED ORDER — GI COCKTAIL ~~LOC~~
30.0000 mL | Freq: Once | ORAL | Status: AC
  Administered 2017-09-30: 30 mL via ORAL
  Filled 2017-09-30: qty 30

## 2017-09-30 MED ORDER — ONDANSETRON HCL 4 MG/2ML IJ SOLN
4.0000 mg | Freq: Once | INTRAMUSCULAR | Status: AC
  Administered 2017-09-30: 4 mg via INTRAVENOUS
  Filled 2017-09-30: qty 2

## 2017-09-30 MED ORDER — ACETAMINOPHEN 325 MG PO TABS: 650.0000 mg | ORAL_TABLET | Freq: Once | ORAL | Status: DC

## 2017-09-30 MED ORDER — FAMOTIDINE IN NACL 20-0.9 MG/50ML-% IV SOLN
20.0000 mg | Freq: Once | INTRAVENOUS | Status: AC
  Administered 2017-09-30: 20 mg via INTRAVENOUS
  Filled 2017-09-30: qty 50

## 2017-09-30 NOTE — ED Provider Notes (Signed)
[facility] EMERGENCY DEPARTMENT Provider Note   CSN: [id number] Arrival date & time: 09/30/17  0228     History   Chief Complaint Chief Complaint  Patient presents with  . Weakness  . Chest Pain    HPI [name] is a 27 y.o. female.  HPI 27 year old female past medical history significant for carrier for fragile X syndrome, chronic constipation, asthma, schizoaffective disorder, drug-seeking behavior, seizures that presents to the emergency department today for multiple complaints.  Patient was transported by EMS for complaints of generalized weakness, chest pain, diarrhea, generalized abdominal pain and decreased appetite.  Patient states that her symptoms started at 9 PM this evening.  Patient describes the chest pain is substernal pain that was sharp in nature.  The pain does not radiate.  Reports some mild shortness of breath but denies any associated diaphoresis.  She reports some mild nausea but denies any emesis.  No history of same pain.  Chest pain is not exertional or pleuritic in nature.  Patient denies any cardiac disease.  Denies any history of PE/DVT, prolonged immobilization, recent hospitalization/surgeries, unilateral leg swelling or calf tenderness, hemoptysis, OCP use.  Patient also complains of generalized abdominal pain with associated nausea and one episode of nonbloody diarrhea earlier this evening.  She reports passing gas.  She also reports some epigastric discomfort.  Patient describes this as burning in nature.  She states she has a history of pancreatitis and that the abdominal pain feels similar.  Denies any history of alcohol use.  Denies any chronic NSAID use. Denies any recent travel.  Denies any recent antibiotic use.  Patient denies any urinary symptoms.  No known sick contacts.  Denies any associated fevers or chills.  She has not taken anything for her symptoms prior to arrival.  Nothing makes better or worse.  Patient reports  generalized weakness because she is not eating.  Patient states that she cannot afford food so she has not been eating anything.  Pt denies any fever, chill, ha, vision changes, lightheadedness, dizziness, congestion, neck pain, ough,urinary symptoms, melena, hematochezia, lower extremity paresthesias.  Past Medical History:  Diagnosis Date  . Acid reflux   . Asthma    last attack 03/13/15 or 03/14/15  . Autism   . 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 Chronic constipation   . Depression   . Drug-seeking behavior   . Essential tremor   . Headache   . Overdose of acetaminophen 07/2017   and other meds  . Personality disorder ([place])   . Schizo-affective psychosis ([place])   . Schizoaffective disorder, bipolar type ([place])   . Seizures [facility])    Last seizure December 2017    Patient Active Problem List   Diagnosis Date Noted  . Drug overdose 07/22/2017  . Overdose 07/22/2017  . Intentional acetaminophen overdose ([place])   . Metabolic acidosis   . Pancreatitis, acute 06/30/2017  . Acute pancreatitis 06/30/2017  . DUB (dysfunctional uterine bleeding) 11/22/2016  . Hyperprolactinemia ([place]) 08/20/2016  . Contraceptive management 08/09/2016  . Tachycardia 01/13/2016  . 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  . Seizure disorder ([place]) 08/08/2015  . Chronic migraine 07/27/2015  . Asthma 04/15/2015  . Schizoaffective disorder, bipolar type ([place]) 03/10/2014  . Borderline personality disorder ([place]) 03/10/2014  . PTSD (post-traumatic stress disorder) 03/10/2014  . Autism spectrum disorder 06/15/2013    Past Surgical History:  Procedure Laterality Date  . MOUTH SURGERY  2009 or 2010     OB  History    Gravida  2   Para  1   Term  1   Preterm      AB  1   Living  1     SAB  1   TAB      Ectopic      Multiple  0   Live Births  1            Home Medications    Prior to Admission medications   Medication Sig Start Date End Date Taking? Authorizing Provider  albuterol  (PROVENTIL HFA;VENTOLIN HFA) 108 (90 Base) MCG/ACT inhaler Inhale 1-2 puffs into the lungs every 6 (six) hours as needed for wheezing or shortness of breath.   Yes [provider]  fluticasone-salmeterol (ADVAIR HFA) 115-21 MCG/ACT inhaler Inhale 2 puffs into the lungs 2 (two) times daily. 08/03/15  Yes Timmothy Euler, MD  hydrOXYzine (ATARAX/VISTARIL) 50 MG tablet Take 50 mg by mouth at bedtime. 08/15/17  Yes [provider]  levETIRAcetam (KEPPRA) 500 MG tablet Take 1 tablet (500 mg total) by mouth 2 (two) times daily. 07/30/17  Yes Clapacs, Madie Reno, MD  prazosin (MINIPRESS) 2 MG capsule Take 1 capsule (2 mg total) by mouth 2 (two) times daily. Patient taking differently: Take 2 mg by mouth daily.  07/30/17  Yes Clapacs, Madie Reno, MD  progesterone (PROMETRIUM) 200 MG capsule Take 1 capsule (200 mg total) by mouth daily. 1 tablet first ten days of each month 09/03/17  Yes Chancy Milroy, MD  risperiDONE (RISPERDAL) 2 MG tablet Take 1 tablet (2 mg total) by mouth 3 (three) times daily. 07/30/17  Yes Clapacs, Madie Reno, MD  sertraline (ZOLOFT) 100 MG tablet Take 1.5 tablets (150 mg total) by mouth daily. 07/31/17  Yes Clapacs, Madie Reno, MD  topiramate (TOPAMAX) 100 MG tablet Take 1 tablet (100 mg total) by mouth at bedtime. 07/30/17  Yes Clapacs, Madie Reno, MD    Family History Family History  Problem Relation Age of Onset  . Mental illness Father   . Asthma Father   . PDD Brother   . Seizures Brother     Social History Social History   Tobacco Use  . Smoking status: Former Smoker    Packs/day: 0.00  . Smokeless tobacco: Never Used  . Tobacco comment: Smoked for 2  years age [age]  Substance Use Topics  . Alcohol use: No    Alcohol/week: 1.0 standard drinks    Types: 1 Standard drinks or equivalent per week  . Drug use: No    Comment: History of cocaine use at age 5 for 4 months     Allergies   Bee venom; Coconut flavor; Geodon [ziprasidone hcl]; Haloperidol and related;  Lithium; Oxycodone; Seroquel [quetiapine fumarate]; Shellfish allergy; Phenergan [promethazine hcl]; Prilosec [omeprazole]; Sulfa antibiotics; and Tegretol [carbamazepine]   Review of Systems Review of Systems  All other systems reviewed and are negative.    Physical Exam Updated Vital Signs BP 109/70 (BP Location: Right Arm)   Pulse 80   Temp 98.5 F (36.9 C) (Oral)   Resp 13   Ht 5\' 5"  (1.651 m)   Wt 81.2 kg   SpO2 99%   BMI 29.79 kg/m   Physical Exam  Constitutional: She is oriented to person, place, and time. She appears well-developed and well-nourished.  Non-toxic appearance. No distress.  HENT:  Head: Normocephalic and atraumatic.  Nose: Nose normal.  Mouth/Throat: Oropharynx is clear and moist.  Eyes: Pupils are equal,  round, and reactive to light. Conjunctivae are normal. Right eye exhibits no discharge. Left eye exhibits no discharge.  Neck: Normal range of motion. Neck supple.  Cardiovascular: Normal rate, regular rhythm, normal heart sounds and intact distal pulses. Exam reveals no gallop and no friction rub.  No murmur heard. Pulmonary/Chest: Effort normal and breath sounds normal. No stridor. No respiratory distress. She has no wheezes. She has no rales. She exhibits tenderness.    Abdominal: Soft. Normal appearance and bowel sounds are normal. There is generalized tenderness and tenderness in the epigastric area. There is no rigidity, no rebound, no guarding, no CVA tenderness, no tenderness at McBurney's point and negative Murphy's sign.  Musculoskeletal: Normal range of motion. She exhibits no tenderness.  Lymphadenopathy:    She has no cervical adenopathy.  Neurological: She is alert and oriented to person, place, and time.  Skin: Skin is warm and dry. Capillary refill takes less than 2 seconds.  Psychiatric: Her behavior is normal. Judgment and thought content normal.  Nursing note and vitals reviewed.    ED Treatments / Results  Labs (all labs  ordered are listed, but only abnormal results are displayed) Labs Reviewed  BASIC METABOLIC PANEL - Abnormal; Notable for the following components:      Result Value   Potassium 3.4 (*)    Glucose, Bld 114 (*)    All other components within normal limits  CBC - Abnormal; Notable for the following components:   RBC 5.13 (*)    All other components within normal limits  LIPASE, BLOOD  URINALYSIS, ROUTINE W REFLEX MICROSCOPIC  I-STAT TROPONIN, ED  I-STAT BETA HCG BLOOD, ED ([facility], [facility], [facility] ONLY)    EKG EKG Interpretation  Date/Time:  Monday September 30 2017 02:51:52 EDT Ventricular Rate:  73 PR Interval:    QRS Duration: 85 QT Interval:  395 QTC Calculation: 436 R Axis:   35 Text Interpretation:  Sinus rhythm Low voltage, precordial leads ST elev, probable normal early repol pattern No acute changes s1q3t3 is new Confirmed by Varney Biles [phone]) on 09/30/2017 4:28:27 AM   Radiology Dg Chest 2 View  Result Date: 09/30/2017 CLINICAL DATA:  27 y/o F; generalized weakness and chest pain. Diarrhea. EXAM: CHEST - 2 VIEW COMPARISON:  05/08/2016 chest radiograph FINDINGS: Stable heart size and mediastinal contours are within normal limits. Both lungs are clear. Mild levocurvature of the lumbar spine. No acute osseous abnormality identified. IMPRESSION: No acute pulmonary process identified. Electronically Signed   By: Kristine Garbe M.D.   On: 09/30/2017 03:31   US Abdomen Complete  Result Date: 09/30/2017 CLINICAL DATA:  27 y/o F; epigastric and right upper quadrant abdominal pain. History of pancreatitis. EXAM: ABDOMEN ULTRASOUND COMPLETE COMPARISON:  07/14/2017 abdominal ultrasound. 06/30/2017 CT abdomen and pelvis. FINDINGS: Gallbladder: No gallstones or wall thickening visualized. No sonographic Murphy sign noted by sonographer. Common bile duct: Diameter: 4.3 mm Liver: No focal liver lesion. Mildly increased liver echogenicity. Portal vein is patent on color Doppler imaging with  normal direction of blood flow towards the liver. IVC: No abnormality visualized. Pancreas: Largely obscured by bowel gas shadow. Visualized portion unremarkable. Spleen: Size and appearance within normal limits. Right Kidney: Length: 10.0 cm. Echogenicity within normal limits. No mass or hydronephrosis visualized. Left Kidney: Length: 10.5 cm. Echogenicity within normal limits. No mass or hydronephrosis visualized. Abdominal aorta: No aneurysm visualized. Other findings: None. IMPRESSION: 1. No acute process identified. 2. Mildly increased liver echogenicity may represent steatosis. Electronically Signed   By: Mia Creek  [place] M.D.   On: 09/30/2017 04:35    Procedures Procedures (including critical care time)  Medications Ordered in ED Medications  ketorolac (TORADOL) 15 MG/ML injection 15 mg (15 mg Intravenous Not Given 09/30/17 0443)  acetaminophen (TYLENOL) tablet 650 mg (650 mg Oral Not Given 09/30/17 0443)  famotidine (PEPCID) IVPB 20 mg premix (0 mg Intravenous Stopped 09/30/17 0430)  gi cocktail (Maalox,Lidocaine,Donnatal) (30 mLs Oral Given 09/30/17 0323)  ondansetron (ZOFRAN) injection 4 mg (4 mg Intravenous Given 09/30/17 0322)  sodium chloride 0.9 % bolus 1,000 mL (0 mLs Intravenous Stopped 09/30/17 0430)     Initial Impression / Assessment and Plan / ED Course  I have reviewed the triage vital signs and the nursing notes.  Pertinent labs & imaging results that were available during my care of the patient were reviewed by me and considered in my medical decision making (see chart for details).     The patient presents to the ED with multiple complaints including chest pain, generalized weakness, abdominal pain and diarrhea.  His vital signs are reassuring.  She is afebrile.  No tachycardia or hypotension is noted.  Lungs clear to auscultation bilaterally.  Heart regular rate and rhythm.  Generalized abdominal tenderness is with mild tenderness to the epigastric region to  palpation.  Patient does have tenderness of the anterior chest wall.  Otherwise exam reassuring.  Lab work shows no leukocytosis.  Normal hemoglobin.  No significant electrolyte derangement.  Negative pregnancy test.  Normal lipase.  Patient has not been able to give a urine sample at this time.  X-ray without any acute findings.  EKG does show normal sinus rhythm and appears similar to prior tracing.  She does have a new S1Q3T3 on the EKG.  Ultrasound was obtained that showed some hepatic stenosis but no other acute findings.  Patient's troponin was initially not run and is currently pending at this time.  Patient was given GI cocktail, Zofran, Pepcid and fluids.  She felt significantly improved.  Refused Tylenol and Toradol.  Patient states that she wants to leave because she needs to catch the bus.  I instructed patient that I am waiting on her troponin and a ua to result however patient states that she cannot stay any longer.  I discussed with patient that she would have to leave against my advice as her work-up is not complete and risk versus benefits were discussed with patient.  She has decided to leave AMA.  Patient work-up has been very reassuring in the ED today.  Clinical presentation does not seem consistent with ACS or dissection.  Unknown etiology of patient's chest pain.  She is PERC negative.  She does have a new S1Q3T3 on her EKG however patient is not hypoxic or tachycardic in the ED.  Chest pain was reproducible.  Pain improved with GI cocktail, Zofran and Pepcid.  I have low suspicion for PE at this time.  Patient will need close outpatient follow-up for EKG changes.  Patient does report history of pancreatitis in the past.  Her lipase was normal today.  Low suspicion for pancreatitis.  Ultrasound shows no signs of cholecystitis.  Patient has no right lower quadrant abdominal pain and I doubt appendicitis.  Unable to obtain UA at this time however patient denies urinary symptoms.  Low suspicion  for UTI.  Patient has no signs of significant dehydration on lab work in the ED.  Pt presents to the ED for weakness, chest pain, shortness of breath, abdominal pain, diarrhea. They  have decided to leave AMA. I have discussed my concerns as a provider and the possibility that this may worsen. We discussed the nature, risks and benefits, and alternatives to treatment. I have specifically discussed that without further evaluation I cannot guarantee there is not a life threatening event occuring.  Time was given to allow the opportunity to ask questions and consider the options, and after the discussion, the patient decided to refuse the offered treatment. Pt is A&Ox4, his own POA and states understanding of my concerns and the possible consequences. After refusal, I made every reasonable opportunity to treat them to the best of my ability. I have made the patient aware that this is an [place] discharge, but they may return at any time for further evaluation and treatment.    Final Clinical Impressions(s) / ED Diagnoses   Final diagnoses:  Weakness  Atypical chest pain  Generalized abdominal pain  Diarrhea, unspecified type    ED Discharge Orders    None       Doristine Devoid, PA-C 09/30/17 [id number]    Varney Biles, MD 09/30/17 [phone]

## 2017-09-30 NOTE — ED Triage Notes (Signed)
Per ems pt has generalized weakness and chest pain. Diarrhea also. Pt stated this all began about 9pm. 138/90, 84 p, 16 rr, 96% cbg 98

## 2017-09-30 NOTE — Discharge Instructions (Addendum)
You are leaving before your work-up is complete.  You accept the risk of leaving.  Return to ED with any worsening symptoms.  Make sure that you are staying hydrated.  Follow-up with your primary care doctor.

## 2017-09-30 NOTE — ED Notes (Signed)
Patient verbalizes understanding of risks factors of leaving before full workup is complete. Patient requests to leave AMA.

## 2017-10-14 ENCOUNTER — Encounter (HOSPITAL_COMMUNITY): Payer: Self-pay | Admitting: *Deleted

## 2017-10-14 ENCOUNTER — Inpatient Hospital Stay (HOSPITAL_COMMUNITY)
Admission: AD | Admit: 2017-10-14 | Discharge: 2017-10-14 | Disposition: A | Discharge status: Home / Self Care | Payer: PPO | Source: Ambulatory Visit | Attending: Obstetrics & Gynecology | Admitting: Obstetrics & Gynecology

## 2017-10-14 DIAGNOSIS — Z885 Allergy status to narcotic agent status: Secondary | ICD-10-CM | POA: Diagnosis not present

## 2017-10-14 DIAGNOSIS — R569 Unspecified convulsions: Secondary | ICD-10-CM | POA: Diagnosis not present

## 2017-10-14 DIAGNOSIS — R112 Nausea with vomiting, unspecified: Secondary | ICD-10-CM | POA: Diagnosis present

## 2017-10-14 DIAGNOSIS — Z87891 Personal history of nicotine dependence: Secondary | ICD-10-CM | POA: Diagnosis not present

## 2017-10-14 DIAGNOSIS — R51 Headache: Secondary | ICD-10-CM | POA: Insufficient documentation

## 2017-10-14 DIAGNOSIS — Z79899 Other long term (current) drug therapy: Secondary | ICD-10-CM | POA: Insufficient documentation

## 2017-10-14 DIAGNOSIS — R350 Frequency of micturition: Secondary | ICD-10-CM | POA: Diagnosis not present

## 2017-10-14 DIAGNOSIS — Z882 Allergy status to sulfonamides status: Secondary | ICD-10-CM | POA: Diagnosis not present

## 2017-10-14 DIAGNOSIS — R109 Unspecified abdominal pain: Secondary | ICD-10-CM | POA: Diagnosis present

## 2017-10-14 DIAGNOSIS — M545 Low back pain: Secondary | ICD-10-CM | POA: Diagnosis not present

## 2017-10-14 DIAGNOSIS — K219 Gastro-esophageal reflux disease without esophagitis: Secondary | ICD-10-CM | POA: Diagnosis not present

## 2017-10-14 DIAGNOSIS — R1013 Epigastric pain: Secondary | ICD-10-CM | POA: Insufficient documentation

## 2017-10-14 DIAGNOSIS — Z888 Allergy status to other drugs, medicaments and biological substances status: Secondary | ICD-10-CM | POA: Insufficient documentation

## 2017-10-14 LAB — URINALYSIS, ROUTINE W REFLEX MICROSCOPIC
Bilirubin Urine: NEGATIVE
GLUCOSE, UA: NEGATIVE mg/dL
HGB URINE DIPSTICK: NEGATIVE
Ketones, ur: NEGATIVE mg/dL
Leukocytes, UA: NEGATIVE
Nitrite: NEGATIVE
PH: 5 (ref 5.0–8.0)
Protein, ur: NEGATIVE mg/dL
Specific Gravity, Urine: 1.016 (ref 1.005–1.030)

## 2017-10-14 LAB — COMPREHENSIVE METABOLIC PANEL
ALBUMIN: 4 g/dL (ref 3.5–5.0)
ALK PHOS: 74 U/L (ref 38–126)
ALT: 12 U/L (ref 0–44)
AST: 14 U/L — ABNORMAL LOW (ref 15–41)
Anion gap: 8 (ref 5–15)
BILIRUBIN TOTAL: 0.3 mg/dL (ref 0.3–1.2)
BUN: 10 mg/dL (ref 6–20)
CALCIUM: 9.1 mg/dL (ref 8.9–10.3)
CO2: 21 mmol/L — ABNORMAL LOW (ref 22–32)
Chloride: 111 mmol/L (ref 98–111)
Creatinine, Ser: 0.69 mg/dL (ref 0.44–1.00)
GFR calc Af Amer: >60 mL/min (ref >60)
GFR calc non Af Amer: >60 mL/min (ref >60)
GLUCOSE: 101 mg/dL — AB (ref 70–99)
Potassium: 4 mmol/L (ref 3.5–5.1)
Sodium: 140 mmol/L (ref 135–145)
TOTAL PROTEIN: 7 g/dL (ref 6.5–8.1)

## 2017-10-14 LAB — POCT PREGNANCY, URINE: Preg Test, Ur: NEGATIVE

## 2017-10-14 LAB — CBC
HEMATOCRIT: 38.4 % (ref 36.0–46.0)
HEMOGLOBIN: 12.4 g/dL (ref 12.0–15.0)
MCH: 27.1 pg (ref 26.0–34.0)
MCHC: 32.3 g/dL (ref 30.0–36.0)
MCV: 84 fL (ref 78.0–100.0)
Platelets: 188 10*3/uL (ref 150–400)
RBC: 4.57 MIL/uL (ref 3.87–5.11)
RDW: 13.3 % (ref 11.5–15.5)
WBC: 8.6 10*3/uL (ref 4.0–10.5)

## 2017-10-14 LAB — HCG, SERUM, QUALITATIVE: Preg, Serum: NEGATIVE

## 2017-10-14 MED ORDER — ONDANSETRON 8 MG PO TBDP
8.0000 mg | ORAL_TABLET | Freq: Once | ORAL | Status: AC
  Administered 2017-10-14: 8 mg via ORAL
  Filled 2017-10-14: qty 1

## 2017-10-14 MED ORDER — ONDANSETRON 4 MG PO TBDP: 4.0000 mg | ORAL_TABLET | Freq: Three times a day (TID) | ORAL | 0 refills | Status: DC | PRN

## 2017-10-14 NOTE — MAU Provider Note (Signed)
History     CSN: [id number]  Arrival date and time: 10/14/17 6010   First Provider Initiated Contact with Patient 10/14/17 2037      Chief Complaint  Patient presents with  . Possible Pregnancy   HPI  Ms.  [name] is a 27 y.o. year old [id number] non-pregnant female who presents to MAU reporting "feeling pregnant." She reports having N/V, H/A, lower back pain, fatigue, urinary frequency, metallic taste in her mouth, heartburn, and sore breasts all since 09/23/17. She doesn't know when her LMP was. She reports "I have always had very irregular periods." She doesn't use any form birth control. She states, "Birth control is against my cultural beliefs." She reports a h/o pancreatitis. Her PCP is Kathyrn Lass at [facility] at [employer] and her GI specialist with with [facility] as well.   Past Medical History:  Diagnosis Date  . Acid reflux   . Asthma    last attack 03/13/15 or 03/14/15  . Autism   . 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 Chronic constipation   . Depression   . Drug-seeking behavior   . Essential tremor   . Headache   . Overdose of acetaminophen 07/2017   and other meds  . Personality disorder ([place])   . Schizo-affective psychosis ([place])   . Schizoaffective disorder, bipolar type ([place])   . Seizures [facility])    Last seizure December 2017    Past Surgical History:  Procedure Laterality Date  . MOUTH SURGERY  2009 or 2010    Family History  Problem Relation Age of Onset  . Mental illness Father   . Asthma Father   . PDD Brother   . Seizures Brother     Social History   Tobacco Use  . Smoking status: Former Smoker    Packs/day: 0.00  . Smokeless tobacco: Never Used  . Tobacco comment: Smoked for 2  years age [age]  Substance Use Topics  . Alcohol use: No    Alcohol/week: 1.0 standard drinks    Types: 1 Standard drinks or equivalent per week  . Drug use: No    Comment: History of cocaine use at age 35 for 4 months    Allergies:   Allergies  Allergen Reactions  . Bee Venom Anaphylaxis  . Coconut Flavor Anaphylaxis and Rash  . Geodon [Ziprasidone Hcl] Other (See Comments)    Pt states that this medication causes paralysis of the mouth.    . Haloperidol And Related Other (See Comments)    Pt states that this medication causes paralysis of the mouth.    . Lithium Other (See Comments)    Reaction:  Seizure-like activity.    . Oxycodone Other (See Comments)    Reaction:  Hallucinations   . Seroquel [Quetiapine Fumarate] Other (See Comments)    Pt states that this medication is too strong.   . Shellfish Allergy Anaphylaxis  . Phenergan [Promethazine Hcl] Other (See Comments)    Reaction:  Chest pain    . Prilosec [Omeprazole] Nausea And Vomiting  . Sulfa Antibiotics Other (See Comments)    Chest pain   . Tegretol [Carbamazepine] Nausea And Vomiting    Medications Prior to Admission  Medication Sig Dispense Refill Last Dose  . albuterol (PROVENTIL HFA;VENTOLIN HFA) 108 (90 Base) MCG/ACT inhaler Inhale 1-2 puffs into the lungs every 6 (six) hours as needed for wheezing or shortness of breath.   09/29/2017 at Unknown time  . fluticasone-salmeterol (ADVAIR HFA) 115-21 MCG/ACT  inhaler Inhale 2 puffs into the lungs 2 (two) times daily. 1 Inhaler 5 09/29/2017 at Unknown time  . hydrOXYzine (ATARAX/VISTARIL) 50 MG tablet Take 50 mg by mouth at bedtime.   09/29/2017 at Unknown time  . levETIRAcetam (KEPPRA) 500 MG tablet Take 1 tablet (500 mg total) by mouth 2 (two) times daily. 60 tablet 1 09/29/2017 at Unknown time  . prazosin (MINIPRESS) 2 MG capsule Take 1 capsule (2 mg total) by mouth 2 (two) times daily. (Patient taking differently: Take 2 mg by mouth daily. ) 60 capsule 1 09/29/2017 at Unknown time  . progesterone (PROMETRIUM) 200 MG capsule Take 1 capsule (200 mg total) by mouth daily. 1 tablet first ten days of each month 30 capsule 3 09/29/2017 at Unknown time  . risperiDONE (RISPERDAL) 2 MG tablet Take 1 tablet (2 mg  total) by mouth 3 (three) times daily. 90 tablet 1 09/29/2017 at Unknown time  . sertraline (ZOLOFT) 100 MG tablet Take 1.5 tablets (150 mg total) by mouth daily. 45 tablet 1 09/29/2017 at Unknown time  . topiramate (TOPAMAX) 100 MG tablet Take 1 tablet (100 mg total) by mouth at bedtime. 30 tablet 1 09/29/2017 at Unknown time    Review of Systems  Constitutional: Positive for fatigue.  HENT: Negative.   Eyes: Negative.   Respiratory: Negative.   Cardiovascular: Negative.   Gastrointestinal: Positive for nausea and vomiting.       Heartburn, Metallic taste in mouth  Endocrine: Negative.   Genitourinary: Positive for frequency.       Sore breasts  Musculoskeletal: Positive for back pain (lower).  Skin: Negative.   Allergic/Immunologic: Negative.   Neurological: Positive for headaches.  Hematological: Negative.   Psychiatric/Behavioral: The patient is nervous/anxious.    Physical Exam   Blood pressure 118/77, pulse 98, temperature 98.3 F (36.8 C), temperature source Oral, resp. rate 16, height 5' 4" (1.626 m), weight 82.1 kg, SpO2 100 %.  Physical Exam  Nursing note and vitals reviewed. Constitutional: She is oriented to person, place, and time. She appears well-developed and well-nourished.  HENT:  Head: Normocephalic and atraumatic.  Eyes: Pupils are equal, round, and reactive to light.  Neck: Normal range of motion.  Cardiovascular: Normal rate, regular rhythm and normal heart sounds.  Respiratory: Effort normal and breath sounds normal.  GI: Soft. Normal appearance and bowel sounds are normal. There is tenderness in the right upper quadrant, epigastric area and left upper quadrant.  Genitourinary:  Genitourinary Comments: Pelvic deferred  Musculoskeletal: Normal range of motion.  Neurological: She is alert and oriented to person, place, and time.  Skin: Skin is warm and dry.  Psychiatric: Her speech is normal.  anxious    MAU Course   Procedures  MDM CCUA UPT Qualitative HCG CBC CMP Zofran 8 mg ODT -- improved N/V, "feels better"  Results for orders placed or performed during the hospital encounter of 10/14/17 (from the past 48 hour(s))  Urinalysis, Routine w reflex microscopic     Status: None   Collection Time: 10/14/17  7:00 PM  Result Value Ref Range   Color, Urine YELLOW YELLOW   APPearance CLEAR CLEAR   Specific Gravity, Urine 1.016 1.005 - 1.030   pH 5.0 5.0 - 8.0   Glucose, UA NEGATIVE NEGATIVE mg/dL   Hgb urine dipstick NEGATIVE NEGATIVE   Bilirubin Urine NEGATIVE NEGATIVE   Ketones, ur NEGATIVE NEGATIVE mg/dL   Protein, ur NEGATIVE NEGATIVE mg/dL   Nitrite NEGATIVE NEGATIVE   Leukocytes, UA NEGATIVE NEGATIVE  Comment: Performed at [facility], [address] [place], [place] [address]  Pregnancy, urine POC     Status: None   Collection Time: 10/14/17  7:05 PM  Result Value Ref Range   Preg Test, Ur NEGATIVE NEGATIVE    Comment:        THE SENSITIVITY OF THIS METHODOLOGY IS >24 mIU/mL   hCG, serum, qualitative     Status: None   Collection Time: 10/14/17  7:34 PM  Result Value Ref Range   Preg, Serum NEGATIVE NEGATIVE    Comment:        THE SENSITIVITY OF THIS METHODOLOGY IS >10 mIU/mL. Performed at [facility] [address], [place], [place] [address]   CBC     Status: None   Collection Time: 10/14/17  7:34 PM  Result Value Ref Range   WBC 8.6 4.0 - 10.5 K/uL   RBC 4.57 3.87 - 5.11 MIL/uL   Hemoglobin 12.4 12.0 - 15.0 g/dL   HCT 38.4 36.0 - 46.0 %   MCV 84.0 78.0 - 100.0 fL   MCH 27.1 26.0 - 34.0 pg   MCHC 32.3 30.0 - 36.0 g/dL   RDW 13.3 11.5 - 15.5 %   Platelets 188 150 - 400 K/uL    Comment: Performed at [facility], [address], [place], [place] [address]  Comprehensive metabolic panel     Status: Abnormal   Collection Time: 10/14/17  7:34 PM  Result Value Ref Range   Sodium 140 135 - 145 mmol/L   Potassium 4.0 3.5 - 5.1 mmol/L   Chloride 111 98 -  111 mmol/L   CO2 21 (L) 22 - 32 mmol/L   Glucose, Bld 101 (H) 70 - 99 mg/dL   BUN 10 6 - 20 mg/dL   Creatinine, Ser 0.69 0.44 - 1.00 mg/dL   Calcium 9.1 8.9 - 10.3 mg/dL   Total Protein 7.0 6.5 - 8.1 g/dL   Albumin 4.0 3.5 - 5.0 g/dL   AST 14 (L) 15 - 41 U/L   ALT 12 0 - 44 U/L   Alkaline Phosphatase 74 38 - 126 U/L   Total Bilirubin 0.3 0.3 - 1.2 mg/dL   GFR calc non Af Amer >60 >60 mL/min   GFR calc Af Amer >60 >60 mL/min    Comment: (NOTE) The eGFR has been calculated using the CKD EPI equation. This calculation has not been validated in all clinical situations. eGFR's persistently <60 mL/min signify possible Chronic Kidney Disease.    Anion gap 8 5 - 15    Comment: Performed at [facility], [address] [place], [place] [address]     Assessment and Plan  Epigastric pain  - Advised to F/U with GI provider  Intractable vomiting with nausea, unspecified vomiting type  - Rx for Zofran 4 mg ODT every 8 hrs prn N/V - Information provided on N/V in adult   - Discharge patient - 'info provided on MAU GYN changes for 2020 - Patient verbalized an understanding of the plan of care and agrees.    Laury Deep, MSN, CNM 10/14/2017, 8:37 PM

## 2017-10-14 NOTE — Discharge Instructions (Signed)
In late February 2020, the [facility] will be moving to the [facility] At that time, the MAU (Maternity Admissions Unit), where you are being seen today, will no longer take care of non-pregnant patients. We strongly encourage you to find a doctor's office before that time, so that you can be seen with any GYN concerns, like vaginal discharge, urinary tract infection, etc.. in a timely manner.  In order to make an office visit more convenient, the Center for [place] at [facility] will be offering evening hours with same-day appointments, walk-in appointments and scheduled appointments available during this time.  Center for [facility] @ [facility] Hours: Monday - 8am - 7:30 pm with walk-in between 4pm- 7:30 pm Tuesday - 8 am - 5 pm open late and accepting walk-ins from 4pm - 7:30pm Wednesday - 8 am - 5 pm open late and accepting walk-ins from 4pm - 7:30pm Thursday 8 am - 5 pm (starting 11/07/17 we will be open late and accepting walk-ins from 4pm - 7:30pm) Friday 8 am - 5 pm  For an appointment please call the Center for [place] @ [facility] at [phone]  For urgent needs, [name] is also available for management of urgent GYN complaints such as vaginal discharge or urinary tract infections.

## 2017-10-14 NOTE — MAU Note (Signed)
Pt reports she has had multiple negative home preg test but is having a lot of symptoms of preg (nausea, vomiting, bloating, freq urination, heartburn and headache) so she believes she is pregnant.

## 2017-10-28 ENCOUNTER — Encounter (HOSPITAL_COMMUNITY): Payer: Self-pay | Admitting: Emergency Medicine

## 2017-10-28 ENCOUNTER — Emergency Department (HOSPITAL_COMMUNITY)
Admission: EM | Admit: 2017-10-28 | Discharge: 2017-10-28 | Disposition: A | Discharge status: Home / Self Care | Payer: PPO | Attending: Emergency Medicine | Admitting: Emergency Medicine

## 2017-10-28 DIAGNOSIS — R109 Unspecified abdominal pain: Secondary | ICD-10-CM | POA: Diagnosis not present

## 2017-10-28 DIAGNOSIS — R1110 Vomiting, unspecified: Secondary | ICD-10-CM | POA: Insufficient documentation

## 2017-10-28 DIAGNOSIS — Z5321 Procedure and treatment not carried out due to patient leaving prior to being seen by health care provider: Secondary | ICD-10-CM | POA: Insufficient documentation

## 2017-10-28 DIAGNOSIS — R110 Nausea: Secondary | ICD-10-CM | POA: Diagnosis not present

## 2017-10-28 LAB — COMPREHENSIVE METABOLIC PANEL
ALT: 14 U/L (ref 0–44)
ANION GAP: 11 (ref 5–15)
AST: 7 U/L — ABNORMAL LOW (ref 15–41)
Albumin: 4.2 g/dL (ref 3.5–5.0)
Alkaline Phosphatase: 86 U/L (ref 38–126)
BILIRUBIN TOTAL: 0.6 mg/dL (ref 0.3–1.2)
BUN: 7 mg/dL (ref 6–20)
CO2: 22 mmol/L (ref 22–32)
Calcium: 9.3 mg/dL (ref 8.9–10.3)
Chloride: 105 mmol/L (ref 98–111)
Creatinine, Ser: 0.65 mg/dL (ref 0.44–1.00)
GFR calc Af Amer: >60 mL/min (ref >60)
Glucose, Bld: 101 mg/dL — ABNORMAL HIGH (ref 70–99)
POTASSIUM: 3.7 mmol/L (ref 3.5–5.1)
Sodium: 138 mmol/L (ref 135–145)
TOTAL PROTEIN: 7.4 g/dL (ref 6.5–8.1)

## 2017-10-28 LAB — CBC
HCT: 42.1 % (ref 36.0–46.0)
HEMOGLOBIN: 13.1 g/dL (ref 12.0–15.0)
MCH: 26.8 pg (ref 26.0–34.0)
MCHC: 31.1 g/dL (ref 30.0–36.0)
MCV: 86.3 fL (ref 78.0–100.0)
Platelets: 223 10*3/uL (ref 150–400)
RBC: 4.88 MIL/uL (ref 3.87–5.11)
RDW: 12.8 % (ref 11.5–15.5)
WBC: 8.4 10*3/uL (ref 4.0–10.5)

## 2017-10-28 LAB — I-STAT BETA HCG BLOOD, ED (MC, WL, AP ONLY)

## 2017-10-28 LAB — LIPASE, BLOOD: Lipase: 26 U/L (ref 11–51)

## 2017-10-28 NOTE — ED Notes (Signed)
Pts name called for a room no answer 

## 2017-10-28 NOTE — ED Notes (Signed)
Unable to locate x 3

## 2017-10-28 NOTE — ED Triage Notes (Signed)
Pt states mid abdominal pain, states she might be pregnant. Lmp august.

## 2017-10-29 ENCOUNTER — Other Ambulatory Visit: Payer: Self-pay

## 2017-10-29 NOTE — Patient Outreach (Signed)
[facility]) Care Management  10/29/2017  Bekah Igoe 06/06/1990 [id number]   Telephone Screen Referral Date : 10/29/2017 Referral Source: Ed Utilization Referral Reason: Ed Utilization Insurance: HTA Outreach attempt #  To patient for screening. Line busy. Unable to leave a message.    Plan: RN Health Coach will outreach the patient within the next four business days.   Lazaro Arms RN, BSN, [place] Direct Dial:  [phone] Fax: [phone]

## 2017-10-30 ENCOUNTER — Ambulatory Visit: Payer: Self-pay

## 2017-11-04 ENCOUNTER — Other Ambulatory Visit: Payer: Self-pay

## 2017-11-04 NOTE — Patient Outreach (Signed)
[facility]) Care Management  11/04/2017  Icey Tello 07/10/1990 [id number]    Telephone Screen Referral Date : 10/29/2017 Referral Source: Ed Utilization Referral Reason: Ed Utilization Insurance: HTA  Outreach attempt # 2  To patient for screening. Message stating patient could not be reached at this time. Unable to leave a message.    Plan: RN Health Coach will outreach the patient within the next four business days.   Lazaro Arms RN, BSN, [place] Direct Dial: [phone]

## 2017-11-06 ENCOUNTER — Other Ambulatory Visit: Payer: Self-pay

## 2017-11-06 NOTE — Patient Outreach (Signed)
[facility]) Care Management  11/06/2017  Madaleine Simmon 08/02/1990 [id number]    Telephone Screen Referral Date :10/29/2017 Referral Source:Ed Utilization Referral Reason:Ed Utilization Insurance:HTA  Outreach attempt # 2XI patient for screening.Line busy unable to leave a message.   Plan: 3rd outreach attempt has been made. If no response to calls and letter in ten business days [place] will proceed with case closure.   Lazaro Arms RN, BSN, [place] Direct Dial:  [phone]  Fax: [phone]

## 2017-11-09 ENCOUNTER — Encounter (HOSPITAL_COMMUNITY): Payer: Self-pay | Admitting: Emergency Medicine

## 2017-11-09 ENCOUNTER — Emergency Department (HOSPITAL_COMMUNITY)
Admission: EM | Admit: 2017-11-09 | Discharge: 2017-11-10 | Disposition: A | Discharge status: Home / Self Care | Payer: PPO | Attending: Emergency Medicine | Admitting: Emergency Medicine

## 2017-11-09 ENCOUNTER — Other Ambulatory Visit: Payer: Self-pay

## 2017-11-09 DIAGNOSIS — J45909 Unspecified asthma, uncomplicated: Secondary | ICD-10-CM | POA: Diagnosis not present

## 2017-11-09 DIAGNOSIS — R1084 Generalized abdominal pain: Secondary | ICD-10-CM | POA: Diagnosis not present

## 2017-11-09 DIAGNOSIS — Z87891 Personal history of nicotine dependence: Secondary | ICD-10-CM | POA: Diagnosis not present

## 2017-11-09 DIAGNOSIS — R1030 Lower abdominal pain, unspecified: Secondary | ICD-10-CM | POA: Diagnosis not present

## 2017-11-09 DIAGNOSIS — Z79899 Other long term (current) drug therapy: Secondary | ICD-10-CM | POA: Insufficient documentation

## 2017-11-09 DIAGNOSIS — F840 Autistic disorder: Secondary | ICD-10-CM | POA: Insufficient documentation

## 2017-11-09 DIAGNOSIS — R1011 Right upper quadrant pain: Secondary | ICD-10-CM | POA: Diagnosis not present

## 2017-11-09 DIAGNOSIS — R61 Generalized hyperhidrosis: Secondary | ICD-10-CM | POA: Diagnosis not present

## 2017-11-09 LAB — I-STAT BETA HCG BLOOD, ED (MC, WL, AP ONLY): I-stat hCG, quantitative: <5 m[IU]/mL (ref <5)

## 2017-11-09 LAB — URINALYSIS, ROUTINE W REFLEX MICROSCOPIC
BACTERIA UA: NONE SEEN
BILIRUBIN URINE: NEGATIVE
Glucose, UA: NEGATIVE mg/dL
KETONES UR: NEGATIVE mg/dL
Leukocytes, UA: NEGATIVE
Nitrite: NEGATIVE
PH: 9 — AB (ref 5.0–8.0)
Protein, ur: NEGATIVE mg/dL
Specific Gravity, Urine: 1.017 (ref 1.005–1.030)

## 2017-11-09 LAB — WET PREP, GENITAL
CLUE CELLS WET PREP: NONE SEEN
Sperm: NONE SEEN
Trich, Wet Prep: NONE SEEN
Yeast Wet Prep HPF POC: NONE SEEN

## 2017-11-09 LAB — CBC
HCT: 43.1 % (ref 36.0–46.0)
HEMOGLOBIN: 13.9 g/dL (ref 12.0–15.0)
MCH: 27.6 pg (ref 26.0–34.0)
MCHC: 32.3 g/dL (ref 30.0–36.0)
MCV: 85.5 fL (ref 78.0–100.0)
Platelets: 240 10*3/uL (ref 150–400)
RBC: 5.04 MIL/uL (ref 3.87–5.11)
RDW: 13 % (ref 11.5–15.5)
WBC: 10.4 10*3/uL (ref 4.0–10.5)

## 2017-11-09 MED ORDER — ONDANSETRON HCL 4 MG/2ML IJ SOLN: 4.0000 mg | Freq: Once | INTRAMUSCULAR | Status: DC

## 2017-11-09 MED ORDER — MORPHINE SULFATE (PF) 4 MG/ML IV SOLN: 4.0000 mg | Freq: Once | INTRAVENOUS | Status: DC

## 2017-11-09 NOTE — ED Notes (Signed)
Pt sts she only wants to know if she is pregnant. She sts her pain is much better now, and "I know what all this is", but it's very important to her to find out the results of the pregnancy test. She refused PIV and pain medication. Pt was a difficult stick for a blood draw and this nurse was not able to obtain all the labs ordered. 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 Will you give me a taxi voucher"?

## 2017-11-09 NOTE — ED Provider Notes (Signed)
[facility] DEPT Provider Note   CSN: [id number] Arrival date & time: 11/09/17  2110     History   Chief Complaint Chief Complaint  Patient presents with  . Abdominal Pain    HPI [name] is a 27 y.o. female.  The history is provided by the patient. No language interpreter was used.  Abdominal Pain       27 year old female with history of autism, borderline personality disorder, polysubstance abuse brought here via EMS for evaluation of abdominal pain.  Patient developed acute onset of diffuse abdominal pain that started approximately 6 hours ago.  Described pain as sharp stabbing radiating across her abdomen with associated nausea and vomited once of food content.  She felt pain is similar to prior pancreatitis but she also voiced concern for potential pregnancy since she has not had her menstrual period for the past several months.  She denies any associated fever, chills, chest pain, shortness of breath, productive cough, hemoptysis.  She denies any dysuria but does notice some vaginal discharge for the past few days.  No vaginal bleeding.  She is a G2, P1.  She report having one sexual partner not using protection.  At this time her pain is moderate in severity.  No specific treatment tried.  Patient denies any recent alcohol or drug use.  Past Medical History:  Diagnosis Date  . Acid reflux   . Asthma    last attack 03/13/15 or 03/14/15  . Autism   . 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 Chronic constipation   . Depression   . Drug-seeking behavior   . Essential tremor   . Headache   . Overdose of acetaminophen 07/2017   and other meds  . Personality disorder ([place])   . Schizo-affective psychosis ([place])   . Schizoaffective disorder, bipolar type ([place])   . Seizures [facility])    Last seizure December 2017    Patient Active Problem List   Diagnosis Date Noted  . Abdominal pain 10/14/2017  . Nausea & vomiting 10/14/2017  . Drug  overdose 07/22/2017  . Overdose 07/22/2017  . Intentional acetaminophen overdose ([place])   . Metabolic acidosis   . Pancreatitis, acute 06/30/2017  . Acute pancreatitis 06/30/2017  . DUB (dysfunctional uterine bleeding) 11/22/2016  . Hyperprolactinemia ([place]) 08/20/2016  . Contraceptive management 08/09/2016  . Tachycardia 01/13/2016  . 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  . Seizure disorder ([place]) 08/08/2015  . Chronic migraine 07/27/2015  . Asthma 04/15/2015  . Schizoaffective disorder, bipolar type ([place]) 03/10/2014  . Borderline personality disorder ([place]) 03/10/2014  . PTSD (post-traumatic stress disorder) 03/10/2014  . Autism spectrum disorder 06/15/2013    Past Surgical History:  Procedure Laterality Date  . MOUTH SURGERY  2009 or 2010     OB History    Gravida  2   Para  1   Term  1   Preterm      AB  1   Living  1     SAB  1   TAB      Ectopic      Multiple  0   Live Births  1            Home Medications    Prior to Admission medications   Medication Sig Start Date End Date Taking? Authorizing Provider  albuterol (PROVENTIL HFA;VENTOLIN HFA) 108 (90 Base) MCG/ACT inhaler Inhale 1-2 puffs into the lungs every 6 (six) hours as needed for wheezing or shortness  of breath.    [provider]  fluticasone-salmeterol (ADVAIR HFA) 115-21 MCG/ACT inhaler Inhale 2 puffs into the lungs 2 (two) times daily. 08/03/15   Timmothy Euler, MD  hydrOXYzine (ATARAX/VISTARIL) 50 MG tablet Take 50 mg by mouth at bedtime. 08/15/17   [provider]  levETIRAcetam (KEPPRA) 500 MG tablet Take 1 tablet (500 mg total) by mouth 2 (two) times daily. 07/30/17   Clapacs, Madie Reno, MD  ondansetron (ZOFRAN ODT) 4 MG disintegrating tablet Take 1 tablet (4 mg total) by mouth every 8 (eight) hours as needed for nausea or vomiting. 10/14/17   Laury Deep, CNM  prazosin (MINIPRESS) 2 MG capsule Take 1 capsule (2 mg total) by mouth 2 (two) times daily. Patient  taking differently: Take 2 mg by mouth daily.  07/30/17   Clapacs, Madie Reno, MD  progesterone (PROMETRIUM) 200 MG capsule Take 1 capsule (200 mg total) by mouth daily. 1 tablet first ten days of each month 09/03/17   Chancy Milroy, MD  risperiDONE (RISPERDAL) 2 MG tablet Take 1 tablet (2 mg total) by mouth 3 (three) times daily. 07/30/17   Clapacs, Madie Reno, MD  sertraline (ZOLOFT) 100 MG tablet Take 1.5 tablets (150 mg total) by mouth daily. 07/31/17   Clapacs, Madie Reno, MD  topiramate (TOPAMAX) 100 MG tablet Take 1 tablet (100 mg total) by mouth at bedtime. 07/30/17   Clapacs, Madie Reno, MD    Family History Family History  Problem Relation Age of Onset  . Mental illness Father   . Asthma Father   . PDD Brother   . Seizures Brother     Social History Social History   Tobacco Use  . Smoking status: Former Smoker    Packs/day: 0.00  . Smokeless tobacco: Never Used  . Tobacco comment: Smoked for 2  years age [age]  Substance Use Topics  . Alcohol use: No    Alcohol/week: 1.0 standard drinks    Types: 1 Standard drinks or equivalent per week  . Drug use: No    Comment: History of cocaine use at age 7 for 4 months     Allergies   Bee venom; Coconut flavor; Geodon [ziprasidone hcl]; Haloperidol and related; Lithium; Oxycodone; Seroquel [quetiapine fumarate]; Shellfish allergy; Phenergan [promethazine hcl]; Prilosec [omeprazole]; Sulfa antibiotics; and Tegretol [carbamazepine]   Review of Systems Review of Systems  Gastrointestinal: Positive for abdominal pain.  All other systems reviewed and are negative.    Physical Exam Updated Vital Signs BP (!) 123/55 (BP Location: Right Arm)   Pulse 91   Temp 97.9 F (36.6 C) (Oral)   Resp 20   Ht 5\' 4"  (1.626 m)   Wt 83.8 kg   LMP  (LMP Unknown)   SpO2 98%   BMI 31.72 kg/m   Physical Exam  Constitutional: She appears well-developed and well-nourished. No distress.  HENT:  Head: Atraumatic.  Eyes: Conjunctivae are normal.  Neck:  Neck supple.  Cardiovascular: Normal rate and regular rhythm.  Pulmonary/Chest: Effort normal and breath sounds normal.  Abdominal: Soft. Normal appearance and bowel sounds are normal. There is generalized tenderness.  Genitourinary:  Genitourinary Comments: Pelvic exam performed with permission of pt and female ED tach assist during exam.  External genitalia w/out lesions.  Vaginal vault with normal functional discharge.  Cervix w/out lesions, not friable, GC/Chlamydia and wet prep obtained and sent to lab.  Bimanual exam w/out CMT, uterine or adnexal tenderness   Neurological: She is alert.  Skin: No rash noted.  Psychiatric: She has a normal mood and affect.  Nursing note and vitals reviewed.    ED Treatments / Results  Labs (all labs ordered are listed, but only abnormal results are displayed) Labs Reviewed  WET PREP, GENITAL - Abnormal; Notable for the following components:      Result Value   WBC, Wet Prep HPF POC FEW (*)    All other components within normal limits  URINALYSIS, ROUTINE W REFLEX MICROSCOPIC - Abnormal; Notable for the following components:   APPearance CLOUDY (*)    pH 9.0 (*)    Hgb urine dipstick SMALL (*)    All other components within normal limits  CBC  LIPASE, BLOOD  COMPREHENSIVE METABOLIC PANEL  RAPID HIV SCREEN (HIV 1/2 AB+AG)  RPR  I-STAT BETA HCG BLOOD, ED ([facility], [facility], [facility] ONLY)  GC/CHLAMYDIA PROBE AMP ([place]) NOT AT [facility]    EKG None  Radiology No results found.  Procedures Procedures (including critical care time)  Medications Ordered in ED Medications  morphine 4 MG/ML injection 4 mg (4 mg Intravenous Refused 11/09/17 2343)  ondansetron (ZOFRAN) injection 4 mg (4 mg Intravenous Refused 11/09/17 2343)     Initial Impression / Assessment and Plan / ED Course  I have reviewed the triage vital signs and the nursing notes.  Pertinent labs & imaging results that were available during my care of the patient were reviewed by me and  considered in my medical decision making (see chart for details).    BP (!) 123/55 (BP Location: Right Arm)   Pulse 91   Temp 97.9 F (36.6 C) (Oral)   Resp 20   Ht 5\' 4"  (1.626 m)   Wt 83.8 kg   LMP  (LMP Unknown)   SpO2 98%   BMI 31.72 kg/m    Final Clinical Impressions(s) / ED Diagnoses   Final diagnoses:  Lower abdominal pain    ED Discharge Orders    None     10:06 PM Patient here with diffuse abdominal tenderness that started earlier today with associate nausea and vomiting.  She was concern for potential pancreatitis however she does not endorse any recent alcohol use or diabetes.  She also was concern for potential pregnancy since her last menstruation is in August.  Work-up initiated, will perform pelvic examination.  11:59 PM Pt eloped without notifying staff.  Labs are reassuring thus far. Pregnancy test negative.    Domenic Moras, PA-C 11/09/17 2359    Carmin Muskrat, MD 11/11/17 0001

## 2017-11-09 NOTE — ED Triage Notes (Signed)
Pt presents by EMS for abdominal pain that started earlier today. Also, pt unsure if she may be pregnant.

## 2017-11-10 LAB — COMPREHENSIVE METABOLIC PANEL
ALBUMIN: 4.3 g/dL (ref 3.5–5.0)
ALK PHOS: 80 U/L (ref 38–126)
ALT: 13 U/L (ref 0–44)
ANION GAP: 13 (ref 5–15)
AST: 24 U/L (ref 15–41)
BUN: 17 mg/dL (ref 6–20)
CALCIUM: 10 mg/dL (ref 8.9–10.3)
CO2: 24 mmol/L (ref 22–32)
Chloride: 109 mmol/L (ref 98–111)
Creatinine, Ser: 0.73 mg/dL (ref 0.44–1.00)
GFR calc non Af Amer: >60 mL/min (ref >60)
GLUCOSE: 98 mg/dL (ref 70–99)
Potassium: 4.6 mmol/L (ref 3.5–5.1)
Sodium: 146 mmol/L — ABNORMAL HIGH (ref 135–145)
Total Bilirubin: 0.7 mg/dL (ref 0.3–1.2)
Total Protein: 7.7 g/dL (ref 6.5–8.1)

## 2017-11-10 LAB — LIPASE, BLOOD: Lipase: 60 U/L — ABNORMAL HIGH (ref 11–51)

## 2017-11-10 NOTE — ED Notes (Addendum)
Pt not in the room when this nurse went to check on pt. PA aware.

## 2017-11-11 LAB — GC/CHLAMYDIA PROBE AMP (~~LOC~~) NOT AT ARMC
CHLAMYDIA, DNA PROBE: NEGATIVE
Neisseria Gonorrhea: NEGATIVE

## 2017-11-18 ENCOUNTER — Ambulatory Visit (INDEPENDENT_AMBULATORY_CARE_PROVIDER_SITE_OTHER): Payer: PPO | Admitting: General Practice

## 2017-11-18 DIAGNOSIS — Z3202 Encounter for pregnancy test, result negative: Secondary | ICD-10-CM

## 2017-11-18 LAB — POCT PREGNANCY, URINE: PREG TEST UR: NEGATIVE

## 2017-11-18 NOTE — Progress Notes (Signed)
Patient presents to office today for UPT. UPT -. Patient has not taken pregnancy tests at home & is not on birth control. Patient states she has not missed periods. Patient answered questions asked minimally & when results were given she stood up and left the office.

## 2017-11-20 NOTE — Progress Notes (Signed)
Agree with A & P. 

## 2017-11-28 ENCOUNTER — Telehealth: Payer: Self-pay | Admitting: Neurology

## 2017-11-28 NOTE — Telephone Encounter (Signed)
Patient is calling in stating that her topamax that's not working. She is having migraines every day. Please call her back at [phone]. Thanks!

## 2017-11-28 NOTE — Telephone Encounter (Signed)
Patient called in again and left a vm which was hard to understand but I think she said something about her face being swollen and migraines. Please call. Thanks!

## 2017-11-28 NOTE — Telephone Encounter (Signed)
Called and spoke with Pt. She states she has been having daily headaches for 6 weeks. She is no longer taking the levetiracetam.  Below her left eye is swollen and is tender to the touch.

## 2017-11-29 NOTE — Telephone Encounter (Signed)
Attempted to reach the Pt x3. Call will not go through

## 2017-11-29 NOTE — Telephone Encounter (Signed)
We should probably titrate topiramate to 50mg  in AM and 100mg  at night for one week, then 100mg  twice daily.   I always want to remind young women to take precautions not to get pregnant while on topiramate.  If she is having puffiness/tenderness under the eyes, she may have allergies.  I would suggest taking Zyrtec and see if it improves after a week.  She should probably make a sooner follow up appointment in the next couple of months.

## 2017-12-02 ENCOUNTER — Telehealth: Payer: Self-pay | Admitting: Neurology

## 2017-12-02 NOTE — Telephone Encounter (Signed)
Pt called again this morning, unfortunately, I was not at my desk. I have tried to return her call x5 this morning, the call will not connect. I advised the girls in front office if the Pt calls again, please keep her on hold until I can talk with her. They also tried to call her back and were unable to get the call to go through.

## 2017-12-02 NOTE — Telephone Encounter (Signed)
Patient called back regarding her headache. Please call. Thanks

## 2017-12-02 NOTE — Telephone Encounter (Signed)
Attempted to reach Pt again, call will not go through

## 2017-12-02 NOTE — Telephone Encounter (Signed)
See message from 11/28/17

## 2017-12-03 NOTE — Telephone Encounter (Signed)
Attempted to reach Pt again on several occasions yesterday and today. The call will not go through.

## 2017-12-09 NOTE — Telephone Encounter (Signed)
Attempted to reach Pt again

## 2017-12-09 NOTE — Telephone Encounter (Signed)
Sending letter

## 2018-01-18 ENCOUNTER — Encounter (HOSPITAL_COMMUNITY): Payer: Self-pay

## 2018-01-18 ENCOUNTER — Other Ambulatory Visit: Payer: Self-pay

## 2018-01-18 ENCOUNTER — Emergency Department (HOSPITAL_COMMUNITY)
Admission: EM | Admit: 2018-01-18 | Discharge: 2018-01-18 | Disposition: A | Discharge status: Home / Self Care | Payer: PPO | Attending: Emergency Medicine | Admitting: Emergency Medicine

## 2018-01-18 DIAGNOSIS — R1010 Upper abdominal pain, unspecified: Secondary | ICD-10-CM | POA: Insufficient documentation

## 2018-01-18 DIAGNOSIS — I959 Hypotension, unspecified: Secondary | ICD-10-CM | POA: Diagnosis not present

## 2018-01-18 DIAGNOSIS — J45909 Unspecified asthma, uncomplicated: Secondary | ICD-10-CM | POA: Insufficient documentation

## 2018-01-18 DIAGNOSIS — Z87891 Personal history of nicotine dependence: Secondary | ICD-10-CM | POA: Diagnosis not present

## 2018-01-18 DIAGNOSIS — Z79899 Other long term (current) drug therapy: Secondary | ICD-10-CM | POA: Diagnosis not present

## 2018-01-18 DIAGNOSIS — R0902 Hypoxemia: Secondary | ICD-10-CM | POA: Diagnosis not present

## 2018-01-18 DIAGNOSIS — R1084 Generalized abdominal pain: Secondary | ICD-10-CM | POA: Diagnosis not present

## 2018-01-18 DIAGNOSIS — R110 Nausea: Secondary | ICD-10-CM | POA: Diagnosis not present

## 2018-01-18 LAB — URINALYSIS, ROUTINE W REFLEX MICROSCOPIC
BILIRUBIN URINE: NEGATIVE
GLUCOSE, UA: NEGATIVE mg/dL
HGB URINE DIPSTICK: NEGATIVE
KETONES UR: NEGATIVE mg/dL
NITRITE: NEGATIVE
Protein, ur: NEGATIVE mg/dL
Specific Gravity, Urine: 1.016 (ref 1.005–1.030)
pH: 6 (ref 5.0–8.0)

## 2018-01-18 LAB — CBC WITH DIFFERENTIAL/PLATELET
Abs Immature Granulocytes: 0.03 10*3/uL (ref 0.00–0.07)
BASOS ABS: 0 10*3/uL (ref 0.0–0.1)
Basophils Relative: 0 %
Eosinophils Absolute: 0 10*3/uL (ref 0.0–0.5)
Eosinophils Relative: 1 %
HEMATOCRIT: 46.7 % — AB (ref 36.0–46.0)
HEMOGLOBIN: 14.2 g/dL (ref 12.0–15.0)
IMMATURE GRANULOCYTES: 0 %
LYMPHS ABS: 3.1 10*3/uL (ref 0.7–4.0)
LYMPHS PCT: 36 %
MCH: 26.1 pg (ref 26.0–34.0)
MCHC: 30.4 g/dL (ref 30.0–36.0)
MCV: 85.8 fL (ref 80.0–100.0)
MONOS PCT: 6 %
Monocytes Absolute: 0.5 10*3/uL (ref 0.1–1.0)
NEUTROS PCT: 57 %
NRBC: 0 % (ref 0.0–0.2)
Neutro Abs: 4.9 10*3/uL (ref 1.7–7.7)
Platelets: 259 10*3/uL (ref 150–400)
RBC: 5.44 MIL/uL — ABNORMAL HIGH (ref 3.87–5.11)
RDW: 13 % (ref 11.5–15.5)
WBC: 8.5 10*3/uL (ref 4.0–10.5)

## 2018-01-18 LAB — COMPREHENSIVE METABOLIC PANEL
ALBUMIN: 4.6 g/dL (ref 3.5–5.0)
ALK PHOS: 95 U/L (ref 38–126)
ALT: 12 U/L (ref 0–44)
AST: 20 U/L (ref 15–41)
Anion gap: 11 (ref 5–15)
BUN: 7 mg/dL (ref 6–20)
CHLORIDE: 108 mmol/L (ref 98–111)
CO2: 21 mmol/L — AB (ref 22–32)
CREATININE: 0.79 mg/dL (ref 0.44–1.00)
Calcium: 9.1 mg/dL (ref 8.9–10.3)
GFR calc non Af Amer: >60 mL/min (ref >60)
GLUCOSE: 115 mg/dL — AB (ref 70–99)
Potassium: 3.6 mmol/L (ref 3.5–5.1)
SODIUM: 140 mmol/L (ref 135–145)
Total Bilirubin: 0.4 mg/dL (ref 0.3–1.2)
Total Protein: 7.7 g/dL (ref 6.5–8.1)

## 2018-01-18 LAB — LIPASE, BLOOD: LIPASE: 28 U/L (ref 11–51)

## 2018-01-18 LAB — PREGNANCY, URINE: Preg Test, Ur: NEGATIVE

## 2018-01-18 MED ORDER — ONDANSETRON 4 MG PO TBDP: ORAL_TABLET | ORAL | 0 refills | Status: DC

## 2018-01-18 MED ORDER — FENTANYL CITRATE (PF) 100 MCG/2ML IJ SOLN
50.0000 ug | INTRAMUSCULAR | Status: DC | PRN
  Administered 2018-01-18: 50 ug via INTRAVENOUS
  Filled 2018-01-18: qty 2

## 2018-01-18 MED ORDER — SODIUM CHLORIDE 0.9 % IV BOLUS
1000.0000 mL | Freq: Once | INTRAVENOUS | Status: AC
  Administered 2018-01-18: 1000 mL via INTRAVENOUS

## 2018-01-18 NOTE — ED Provider Notes (Signed)
[facility] EMERGENCY DEPARTMENT Provider Note   CSN: [id number] Arrival date & time: 01/18/18  1839     History   Chief Complaint Chief Complaint  Patient presents with  . Abdominal Pain    HPI [name] is a 27 y.o. female.  Patient with history of personality disorder, acid reflux, reported pancreatitis presents for worsening upper abdominal pain in the last 2 days.  Patient feels that similar to pancreatitis history.  Patient denies gallstone history.  Patient denies alcohol abuse.  Pain constant nonradiating.     Past Medical History:  Diagnosis Date  . Acid reflux   . Asthma    last attack 03/13/15 or 03/14/15  . Autism   . 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 Chronic constipation   . Depression   . Drug-seeking behavior   . Essential tremor   . Headache   . Overdose of acetaminophen 07/2017   and other meds  . Personality disorder ([place])   . Schizo-affective psychosis ([place])   . Schizoaffective disorder, bipolar type ([place])   . Seizures [facility])    Last seizure December 2017    Patient Active Problem List   Diagnosis Date Noted  . Abdominal pain 10/14/2017  . Nausea & vomiting 10/14/2017  . Drug overdose 07/22/2017  . Overdose 07/22/2017  . Intentional acetaminophen overdose ([place])   . Metabolic acidosis   . Pancreatitis, acute 06/30/2017  . Acute pancreatitis 06/30/2017  . DUB (dysfunctional uterine bleeding) 11/22/2016  . Hyperprolactinemia ([place]) 08/20/2016  . Contraceptive management 08/09/2016  . Tachycardia 01/13/2016  . 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  . Seizure disorder ([place]) 08/08/2015  . Chronic migraine 07/27/2015  . Asthma 04/15/2015  . Schizoaffective disorder, bipolar type ([place]) 03/10/2014  . Borderline personality disorder ([place]) 03/10/2014  . PTSD (post-traumatic stress disorder) 03/10/2014  . Autism spectrum disorder 06/15/2013    Past Surgical History:  Procedure Laterality Date  . MOUTH SURGERY   2009 or 2010     OB History    Gravida  2   Para  1   Term  1   Preterm      AB  1   Living  1     SAB  1   TAB      Ectopic      Multiple  0   Live Births  1            Home Medications    Prior to Admission medications   Medication Sig Start Date End Date Taking? Authorizing Provider  albuterol (PROVENTIL HFA;VENTOLIN HFA) 108 (90 Base) MCG/ACT inhaler Inhale 1-2 puffs into the lungs every 6 (six) hours as needed for wheezing or shortness of breath.   Yes [provider]  fluticasone-salmeterol (ADVAIR HFA) 115-21 MCG/ACT inhaler Inhale 2 puffs into the lungs 2 (two) times daily. 08/03/15  Yes Timmothy Euler, MD  Multiple Vitamins-Minerals (HAIR/SKIN/NAILS/BIOTIN) TABS Take 1 tablet by mouth See admin instructions. Take one tablet by mouth every other night at bedtime   Yes [provider]  risperiDONE (RISPERDAL) 2 MG tablet Take 1 tablet (2 mg total) by mouth 3 (three) times daily. Patient taking differently: Take 2 mg by mouth every evening.  07/30/17  Yes Clapacs, Madie Reno, MD  topiramate (TOPAMAX) 100 MG tablet Take 1 tablet (100 mg total) by mouth at bedtime. Patient taking differently: Take 100 mg by mouth every evening.  07/30/17  Yes Clapacs, Madie Reno, MD  levETIRAcetam (  KEPPRA) 500 MG tablet Take 1 tablet (500 mg total) by mouth 2 (two) times daily. Patient not taking: Reported on 01/18/2018 07/30/17   Clapacs, Madie Reno, MD  ondansetron (ZOFRAN ODT) 4 MG disintegrating tablet Take 1 tablet (4 mg total) by mouth every 8 (eight) hours as needed for nausea or vomiting. Patient not taking: Reported on 01/18/2018 10/14/17   Laury Deep, CNM  ondansetron (ZOFRAN ODT) 4 MG disintegrating tablet 4mg  ODT q4 hours prn nausea/vomit 01/18/18   Elnora Morrison, MD  prazosin (MINIPRESS) 2 MG capsule Take 1 capsule (2 mg total) by mouth 2 (two) times daily. Patient not taking: Reported on 01/18/2018 07/30/17   Clapacs, Madie Reno, MD  progesterone (PROMETRIUM)  200 MG capsule Take 1 capsule (200 mg total) by mouth daily. 1 tablet first ten days of each month Patient not taking: Reported on 01/18/2018 09/03/17   Chancy Milroy, MD  sertraline (ZOLOFT) 100 MG tablet Take 1.5 tablets (150 mg total) by mouth daily. Patient not taking: Reported on 01/18/2018 07/31/17   Clapacs, Madie Reno, MD    Family History Family History  Problem Relation Age of Onset  . Mental illness Father   . Asthma Father   . PDD Brother   . Seizures Brother     Social History Social History   Tobacco Use  . Smoking status: Former Smoker    Packs/day: 0.00  . Smokeless tobacco: Never Used  . Tobacco comment: Smoked for 2  years age [age]  Substance Use Topics  . Alcohol use: No    Alcohol/week: 1.0 standard drinks    Types: 1 Standard drinks or equivalent per week  . Drug use: No    Comment: History of cocaine use at age 8 for 4 months     Allergies   Bee venom; Coconut flavor; Geodon [ziprasidone hcl]; Haloperidol and related; Lithium; Oxycodone; Seroquel [quetiapine fumarate]; Shellfish allergy; Phenergan [promethazine hcl]; Prilosec [omeprazole]; Sulfa antibiotics; and Tegretol [carbamazepine]   Review of Systems Review of Systems  Constitutional: Negative for chills and fever.  HENT: Negative for congestion.   Eyes: Negative for visual disturbance.  Respiratory: Negative for shortness of breath.   Cardiovascular: Negative for chest pain.  Gastrointestinal: Positive for abdominal pain and nausea. Negative for vomiting.  Genitourinary: Negative for dysuria and flank pain.  Musculoskeletal: Negative for back pain, neck pain and neck stiffness.  Skin: Negative for rash.  Neurological: Negative for light-headedness and headaches.     Physical Exam Updated Vital Signs BP 109/63   Pulse 86   Temp 97.9 F (36.6 C) (Oral)   Resp 20   Ht 5\' 4"  (1.626 m)   Wt 81.6 kg   LMP  (LMP Unknown)   SpO2 99%   Breastfeeding No   BMI 30.90 kg/m   Physical  Exam Vitals signs and nursing note reviewed.  Constitutional:      Appearance: She is well-developed.  HENT:     Head: Normocephalic and atraumatic.  Eyes:     General:        Right eye: No discharge.        Left eye: No discharge.     Conjunctiva/sclera: Conjunctivae normal.  Neck:     Musculoskeletal: Normal range of motion and neck supple.     Trachea: No tracheal deviation.  Cardiovascular:     Rate and Rhythm: Normal rate and regular rhythm.  Pulmonary:     Effort: Pulmonary effort is normal.     Breath sounds: Normal breath  sounds.  Abdominal:     General: There is no distension.     Palpations: Abdomen is soft.     Tenderness: There is abdominal tenderness in the epigastric area. There is no guarding.  Skin:    General: Skin is warm.     Findings: No rash.  Neurological:     Mental Status: She is alert and oriented to person, place, and time.      ED Treatments / Results  Labs (all labs ordered are listed, but only abnormal results are displayed) Labs Reviewed  COMPREHENSIVE METABOLIC PANEL - Abnormal; Notable for the following components:      Result Value   CO2 21 (*)    Glucose, Bld 115 (*)    All other components within normal limits  CBC WITH DIFFERENTIAL/PLATELET - Abnormal; Notable for the following components:   RBC 5.44 (*)    HCT 46.7 (*)    All other components within normal limits  URINALYSIS, ROUTINE W REFLEX MICROSCOPIC - Abnormal; Notable for the following components:   APPearance CLOUDY (*)    Leukocytes, UA TRACE (*)    Bacteria, UA RARE (*)    All other components within normal limits  LIPASE, BLOOD  PREGNANCY, URINE    EKG None  Radiology No results found.  Procedures Ultrasound ED Abd Date/Time: 01/18/2018 9:38 PM Performed by: Elnora Morrison, MD Authorized by: Elnora Morrison, MD   Procedure details:    Indications: abdominal pain     Hepatobiliary:  Visualized       Hepatobiliary findings:    Common bile duct:   Normal   Gallbladder wall:  Normal   Gallbladder stones: not identified     (including critical care time)  Medications Ordered in ED Medications  fentaNYL (SUBLIMAZE) injection 50 mcg (50 mcg Intravenous Given 01/18/18 2033)  sodium chloride 0.9 % bolus 1,000 mL (0 mLs Intravenous Stopped 01/18/18 2017)     Initial Impression / Assessment and Plan / ED Course  I have reviewed the triage vital signs and the nursing notes.  Pertinent labs & imaging results that were available during my care of the patient were reviewed by me and considered in my medical decision making (see chart for details).    Well-appearing patient presents with upper abdominal pain for which she states is similar to her pancreatitis history.  Blood work reviewed unremarkable lipase normal, no white blood cell count elevation, normal LFTs.  Patient's pain controlled in the ER.  Bedside ultrasound normal-appearing gallbladder without gallstones.  Patient stable for outpatient follow-up.  Results and differential diagnosis were discussed with the patient/parent/guardian. Xrays were independently reviewed by myself.  Close follow up outpatient was discussed, comfortable with the plan.   Medications  fentaNYL (SUBLIMAZE) injection 50 mcg (50 mcg Intravenous Given 01/18/18 2033)  sodium chloride 0.9 % bolus 1,000 mL (0 mLs Intravenous Stopped 01/18/18 2017)    Vitals:   01/18/18 1849 01/18/18 1945 01/18/18 2019 01/18/18 2030  BP: 119/63 (!) 117/98 (!) 111/96 109/63  Pulse: 93 96 91 86  Resp: 20     Temp: 97.9 F (36.6 C)     TempSrc: Oral     SpO2: 100% 100% 100% 99%  Weight: 81.6 kg     Height: 5\' 4"  (1.626 m)       Final diagnoses:  Upper abdominal pain     Final Clinical Impressions(s) / ED Diagnoses   Final diagnoses:  Upper abdominal pain    ED Discharge Orders  Ordered    ondansetron (ZOFRAN ODT) 4 MG disintegrating tablet     01/18/18 2133           Elnora Morrison,  MD 01/18/18 2139

## 2018-01-18 NOTE — ED Notes (Signed)
Pt given water 

## 2018-01-18 NOTE — ED Notes (Signed)
Discharge instructions and prescription discussed with Pt. Pt verbalized understanding. Pt stable and ambulatory.    

## 2018-01-18 NOTE — ED Triage Notes (Signed)
Pt reports mid upper abd pain and states that "its the same as when I had pancreatitis.   Pt is AOx4 upon arrival to ED via EMS.

## 2018-01-18 NOTE — Discharge Instructions (Addendum)
Use zofran as needed for nausea.  If you were given medicines take as directed.  If you are on coumadin or contraceptives realize their levels and effectiveness is altered by many different medicines.  If you have any reaction (rash, tongues swelling, other) to the medicines stop taking and see a physician.    If your blood pressure was elevated in the ER make sure you follow up for management with a primary doctor or return for chest pain, shortness of breath or stroke symptoms.  Please follow up as directed and return to the ER or see a physician for new or worsening symptoms.  Thank you. Vitals:   01/18/18 1849 01/18/18 1945 01/18/18 2019 01/18/18 2030  BP: 119/63 (!) 117/98 (!) 111/96 109/63  Pulse: 93 96 91 86  Resp: 20     Temp: 97.9 F (36.6 C)     TempSrc: Oral     SpO2: 100% 100% 100% 99%  Weight: 81.6 kg     Height: 5\' 4"  (1.626 m)

## 2018-01-27 ENCOUNTER — Other Ambulatory Visit: Payer: Self-pay

## 2018-01-27 NOTE — Patient Outreach (Signed)
[facility]) Care Management  01/27/2018  [name] 03-Feb-1991 [id number]     Late Entry   Multiple attempts to establish contact with patient without success. No response from calls or letter mailed to patient. Case is being closed at this time.   Lazaro Arms RN, BSN, [place] Direct Dial:  [phone]  Fax: [phone]

## 2018-02-17 ENCOUNTER — Other Ambulatory Visit: Payer: Self-pay | Admitting: Neurology

## 2018-02-17 ENCOUNTER — Telehealth: Payer: Self-pay

## 2018-02-17 NOTE — Telephone Encounter (Signed)
Rcvd call from Rupert at [facility]. They fill Pt's medications on a weekly basis. I refilled topiramate and increased dose per message from 12/10/17 to 100 mg BID. Sharyn Lull said Pt has only been getting 100 mg QD. She will check with Pt to see if Pt still wants to increase. Pt is to contact us and let us know.

## 2018-03-10 DIAGNOSIS — J4540 Moderate persistent asthma, uncomplicated: Secondary | ICD-10-CM | POA: Diagnosis not present

## 2018-03-10 DIAGNOSIS — F3130 Bipolar disorder, current episode depressed, mild or moderate severity, unspecified: Secondary | ICD-10-CM | POA: Diagnosis not present

## 2018-05-02 ENCOUNTER — Telehealth: Payer: Self-pay

## 2018-05-02 NOTE — Telephone Encounter (Signed)
Pt called about Keppra. She stopped taking it in Sept, but pharmacy keeps refilling it in her piil packs. We have to call to have it d/c'd. Pt also wants to have her 4/6 visit changed to video visit

## 2018-05-07 NOTE — Progress Notes (Signed)
Virtual Visit via Video Note The purpose of this virtual visit is to provide medical care while limiting exposure to the novel coronavirus.    Consent was obtained for video visit:  Yes.   Answered questions that patient had about telehealth interaction:  Yes.   I discussed the limitations, risks, security and privacy concerns of performing an evaluation and management service by telemedicine. I also discussed with the patient that there may be a patient responsible charge related to this service. The patient expressed understanding and agreed to proceed.  Pt location: Home Physician Location: office Name of referring provider:  Kathyrn Lass, MD I connected with [name] at patients initiation/request on 05/08/2018 at  2:00 PM EDT by video enabled telemedicine application and verified that I am speaking with the correct person using two identifiers. Pt MRN:  [id number] Pt DOB:  1990/07/24 Video Participants:  [name];   History of Present Illness:  [name] is a 28 year old right-handed female with schizoaffective disorder, migraine and asthma who presents for migraines and seizure disorder.  She is accompanied by her mother-in-law who supplements history.  MIGRAINES: Update: Due to worsening headaches, topiramate was titrated to 100mg  twice daily in October.  They are now controlled again. Intensity:  Moderate to severe Duration:  1 hour not requiring treatment Frequency:  Once a month Current NSAIDS:  no Current analgesics:  no Current triptans:  no Current anti-emetic:  no Current muscle relaxants:  no Current anti-anxiolytic:  no Current sleep aide:  no Current Antihypertensive medications:  no Current Antidepressant medications:  no Current Anticonvulsant medications:  topiramate 100mg  twice daily Current Vitamins/Herbal/Supplements:  prenatal Current Antihistamines/Decongestants:  no Other therapy:  no Birth Control:  no Other  medication:  risperidone 2mg    Depression/anxiety:  no Sleep hygiene:  Good Exercise:  No  Hydration:  yes  History: Onset: Teenager Location:  varies Quality:  pounding Initial intensity:  10/10 Aura:  no Prodrome:  no Postdrome:  no Associated symptoms: Photophobia, phonophobia.  Sometimes nausea.  She has not had any new worse headache of her life, waking up from sleep Initial duration:  All day Initial Frequency:  3 to 5 days per week Initial Frequency of abortive medication: 3 to 5 days per week Triggers: None Relieving factors: None Activity:  Cannot function.  Past NSAIDS:  ibuprofen Past analgesics:  no Past abortive triptans:  no Past muscle relaxants:  no Past anti-emetic:  promethazine 25mg  Past antihypertensive medications:  metoprolol Past antidepressant medications:  sertraline Past anticonvulsant medications:  lamotrigine 50mg , Keppra 500mg  twice daily Past vitamins/Herbal/Supplements:  no Other past therapies:  no  SEIZURE DISORDER: Update: She reportedly discontinued Keppra because she wished to no longer be on it since she has not had any recurrent seizures. Current antiepileptic medication:  topiramate 100mg  twice daily  History: She had a concussion on 04/15/15 after falling out of bed and hitting her head on the floor.  She reports loss of consciousness for about a minute.  She had nausea and vomiting and went to the ED where CT of head was unremarkable.  She soon developed new onset seizures.  They have been unwitnessed.  She loses consciousness for less than a minute.  She does not bite her tongue or have incontinence.  They were occurring about 2 to 3 times a month.  She initially was on Depakote, which was subsequently changed to [place] when she became pregnant.  She has responded well to [place].  Her last seizure was in December 2017.  After giving birth in February, she had steadily been decreasing her dose of Keppra under the supervision of her  previous neurologist (from 750mg  three times daily to 1000mg  twice daily to 750mg  twice daily).   She has no previous history of seizures.  She has history of repeated closed head trauma.  There is no known family history of seizures.  MRI of brain without contrast from 06/01/15 was personally reviewed and was normal. EEG from 07/21/15 was normal.  Past antiepileptic medication:  Depakote ER 1000mg  daily (helpful but discontinued due to pregnancy)  PSYCHIATRIC HISTORY: She has an extensive psychiatric history.  She carries a diagnosis of schizoaffective disorder, Bipolar disorder, schizophrenia and borderline personality disorder.  She has history of prior suicide attempts.  She is [facility] at [facility].  She is a ward of the state and was unable to keep her baby.  Her husband had passed away and she is under the care of her mother-in-law.  Her mother-in-law would like more extensive testing to narrow down and determine an accurate psychiatric diagnosis.  Of note, she has been noted to have elevated prolactin levels.  Her GYN ordered an MRI of the brain which has not been performed.  She has upcoming appointment with him to discuss further.  Past Medical History: Past Medical History:  Diagnosis Date  . Acid reflux   . Asthma    last attack 03/13/15 or 03/14/15  . Autism   . 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 Chronic constipation   . Depression   . Drug-seeking behavior   . Essential tremor   . Headache   . Overdose of acetaminophen 07/2017   and other meds  . Personality disorder ([place])   . Schizo-affective psychosis ([place])   . Schizoaffective disorder, bipolar type ([place])   . Seizures [facility])    Last seizure December 2017   Medication: Outpatient Encounter Medications as of 05/08/2018  Medication Sig Note  . progesterone (PROMETRIUM) 200 MG capsule Take 1 capsule (200 mg total) by mouth daily. 1 tablet first ten days of each month 09/30/2017: Due to start 10/06/17  . risperiDONE  (RISPERDAL) 2 MG tablet Take 2 mg by mouth daily.   Marland Kitchen albuterol (PROVENTIL HFA;VENTOLIN HFA) 108 (90 Base) MCG/ACT inhaler Inhale 1-2 puffs into the lungs every 6 (six) hours as needed for wheezing or shortness of breath.   . fluticasone-salmeterol (ADVAIR HFA) 115-21 MCG/ACT inhaler Inhale 2 puffs into the lungs 2 (two) times daily.   Marland Kitchen topiramate (TOPAMAX) 100 MG tablet Take 1 tablet (100 mg total) by mouth 2 (two) times daily.   . [DISCONTINUED] levETIRAcetam (KEPPRA) 500 MG tablet TAKE 1 TABLET BY MOUTH TWICE A DAY   . [DISCONTINUED] Multiple Vitamins-Minerals (HAIR/SKIN/NAILS/BIOTIN) TABS Take 1 tablet by mouth See admin instructions. Take one tablet by mouth every other night at bedtime   . [DISCONTINUED] ondansetron (ZOFRAN ODT) 4 MG disintegrating tablet Take 1 tablet (4 mg total) by mouth every 8 (eight) hours as needed for nausea or vomiting. (Patient not taking: Reported on 01/18/2018)   . [DISCONTINUED] ondansetron (ZOFRAN ODT) 4 MG disintegrating tablet 4mg  ODT q4 hours prn nausea/vomit   . [DISCONTINUED] prazosin (MINIPRESS) 2 MG capsule Take 1 capsule (2 mg total) by mouth 2 (two) times daily. (Patient not taking: Reported on 01/18/2018)   . [DISCONTINUED] risperiDONE (RISPERDAL) 2 MG tablet Take 1 tablet (2 mg total) by mouth 3 (three) times daily. (Patient taking differently: Take  2 mg by mouth daily. )   . [DISCONTINUED] sertraline (ZOLOFT) 100 MG tablet Take 1.5 tablets (150 mg total) by mouth daily. (Patient not taking: Reported on 01/18/2018)    No facility-administered encounter medications on file as of 05/08/2018.    Allergies: Allergies  Allergen Reactions  . Bee Venom Anaphylaxis  . Coconut Flavor Anaphylaxis and Rash  . Geodon [Ziprasidone Hcl] Other (See Comments)    Pt states that this medication causes paralysis of the mouth.    . Haloperidol And Related Other (See Comments)    Pt states that this medication causes paralysis of the mouth.    . Lithium Other (See  Comments)    Reaction:  Seizure-like activity.    . Oxycodone Other (See Comments)    Reaction:  Hallucinations   . Seroquel [Quetiapine Fumarate] Other (See Comments)    Pt states that this medication is too strong.   . Shellfish Allergy Anaphylaxis  . Phenergan [Promethazine Hcl] Other (See Comments)    Reaction:  Chest pain    . Prilosec [Omeprazole] Nausea And Vomiting  . Sulfa Antibiotics Other (See Comments)    Chest pain   . Tegretol [Carbamazepine] Nausea And Vomiting   Family History: Family History  Problem Relation Age of Onset  . Mental illness Father   . Asthma Father   . PDD Brother   . Seizures Brother    Social History: Social History   Socioeconomic History  . Marital status: Widowed    Spouse name: Not on file  . Number of children: 0  . Years of education: Not on file  . Highest education level: Not on file  Occupational History  . Occupation: disability  Social Needs  . Financial resource strain: Not on file  . Food insecurity:    Worry: Not on file    Inability: Not on file  . Transportation needs:    Medical: Not on file    Non-medical: Not on file  Tobacco Use  . Smoking status: Former Smoker    Packs/day: 0.00  . Smokeless tobacco: Never Used  . Tobacco comment: Smoked for 2  years age [age]  Substance and Sexual Activity  . Alcohol use: No    Alcohol/week: 1.0 standard drinks    Types: 1 Standard drinks or equivalent per week  . Drug use: No    Comment: History of cocaine use at age 80 for 4 months  . Sexual activity: Yes    Birth control/protection: None  Lifestyle  . Physical activity:    Days per week: Not on file    Minutes per session: Not on file  . Stress: Not on file  Relationships  . Social connections:    Talks on phone: Not on file    Gets together: Not on file    Attends religious service: Not on file    Active member of club or organization: Not on file    Attends meetings of clubs or organizations: Not on file     Relationship status: Not on file  . Intimate partner violence:    Fear of current or ex partner: Not on file    Emotionally abused: Not on file    Physically abused: Not on file    Forced sexual activity: Not on file  Other Topics Concern  . Not on file  Social History Narrative   Marital status: married; guardian is Furniture conservator/restorer      Children: none      Lives: with  husband in apartment      Employment:  Disability      Tobacco: quit smoking; smoked for two years.      Alcohol ;none      Drugs: none   Has not traveled outside of the country.        Review Of Systems: Review of Systems  Constitutional: Negative for diaphoresis, fever and malaise/fatigue.  HENT: Negative for congestion, ear discharge, ear pain and sore throat.   Eyes: Negative for double vision, pain, discharge and redness.  Respiratory: Negative for cough, shortness of breath and wheezing.   Cardiovascular: Negative for chest pain, palpitations and orthopnea.  Gastrointestinal: Negative for constipation, diarrhea, nausea and vomiting.  Genitourinary: Negative for dysuria.  Musculoskeletal: Negative for myalgias.  Skin: Negative for itching and rash.  Endo/Heme/Allergies: Negative for environmental allergies. Does not bruise/bleed easily.  Neurological:  As above Psychiatric:  Denies depression and anxiety  Observations/Objective:   Height 5\' 4"  (1.626 m), weight 178 lb (80.7 kg). alert and oriented to person, place, and time. Attention span and concentration intact, recent and remote memory intact, fund of knowledge intact.  Speech fluent and not dysarthric, language intact.  Pupils equal and round.  Moves eyes in all directions.  Face symmetric.  Assessment and Plan:   1.  Migraine without aura, without status migrainosus, not intractable, stable 2.  Seizure disorder, stable.  She independently stopped Keppra.  However, seizure prevention is being covered since increasing the topiramate anyway.  1.   Topiramate 100mg  twice daily 2.  Limit use of pain relievers to no more than 2 days out of week to prevent risk of rebound or medication-overuse headache. 3.  Keep headache diary. 4.  Follow up in one year.  Follow Up Instructions:    -I discussed the assessment and treatment plan with the patient. The patient was provided an opportunity to ask questions and all were answered. The patient agreed with the plan and demonstrated an understanding of the instructions.   The patient was advised to call back or seek an in-person evaluation if the symptoms worsen or if the condition fails to improve as anticipated.   Dudley Major, DO

## 2018-05-08 ENCOUNTER — Other Ambulatory Visit: Payer: Self-pay

## 2018-05-08 ENCOUNTER — Encounter: Payer: Self-pay | Admitting: Neurology

## 2018-05-08 ENCOUNTER — Telehealth (INDEPENDENT_AMBULATORY_CARE_PROVIDER_SITE_OTHER): Payer: PPO | Admitting: Neurology

## 2018-05-08 VITALS — Ht 64.0 in | Wt 178.0 lb

## 2018-05-08 DIAGNOSIS — G43009 Migraine without aura, not intractable, without status migrainosus: Secondary | ICD-10-CM | POA: Diagnosis not present

## 2018-05-08 DIAGNOSIS — G40909 Epilepsy, unspecified, not intractable, without status epilepticus: Secondary | ICD-10-CM | POA: Diagnosis not present

## 2018-05-08 NOTE — Patient Instructions (Signed)
1.  Continue topiramate 100mg  twice daily 2.  Follow up in one year.

## 2018-05-12 ENCOUNTER — Ambulatory Visit: Payer: Self-pay | Admitting: Neurology

## 2018-05-23 DIAGNOSIS — Z6831 Body mass index (BMI) 31.0-31.9, adult: Secondary | ICD-10-CM | POA: Diagnosis not present

## 2018-05-23 DIAGNOSIS — R110 Nausea: Secondary | ICD-10-CM | POA: Diagnosis not present

## 2018-05-23 DIAGNOSIS — R1013 Epigastric pain: Secondary | ICD-10-CM | POA: Diagnosis not present

## 2018-05-26 ENCOUNTER — Telehealth: Payer: Self-pay | Admitting: Family Medicine

## 2018-05-26 NOTE — Telephone Encounter (Signed)
Spoke with patient to get her a virtual appointment to speak with Rip Harbour about concerns with her getting pregnant. Appointment was given 5/18 @ 1:15 and patient verbalized understanding. No further questions.

## 2018-06-06 ENCOUNTER — Other Ambulatory Visit: Payer: Self-pay | Admitting: Neurology

## 2018-06-23 ENCOUNTER — Ambulatory Visit: Payer: Self-pay | Admitting: Obstetrics and Gynecology

## 2018-06-23 ENCOUNTER — Ambulatory Visit: Payer: PPO | Admitting: Advanced Practice Midwife

## 2018-06-23 ENCOUNTER — Telehealth: Payer: Self-pay | Admitting: Neurology

## 2018-06-23 NOTE — Telephone Encounter (Signed)
Pt has had a migraine for the last three days and wants to know what we can do to help her please call

## 2018-06-24 MED ORDER — PREDNISONE 10 MG (21) PO TBPK: ORAL_TABLET | ORAL | 0 refills | Status: DC

## 2018-06-24 NOTE — Telephone Encounter (Signed)
Called and spoke with Pt. Advised her Per Dr Tomi Likens, he recommends prednisone taper. I advised her of 6,5,4,3,2,1 dosing, no additional NSAIDS and can take OTC Pepcip if GI upset

## 2018-07-03 ENCOUNTER — Other Ambulatory Visit: Payer: Self-pay

## 2018-07-03 ENCOUNTER — Encounter: Payer: Self-pay | Admitting: Obstetrics and Gynecology

## 2018-07-03 ENCOUNTER — Telehealth (INDEPENDENT_AMBULATORY_CARE_PROVIDER_SITE_OTHER): Payer: PPO | Admitting: Obstetrics and Gynecology

## 2018-07-03 DIAGNOSIS — Z319 Encounter for procreative management, unspecified: Secondary | ICD-10-CM | POA: Diagnosis not present

## 2018-07-03 DIAGNOSIS — N938 Other specified abnormal uterine and vaginal bleeding: Secondary | ICD-10-CM | POA: Diagnosis not present

## 2018-07-03 NOTE — Progress Notes (Signed)
   TELEHEALTH VIRTUAL GYNECOLOGY VISIT ENCOUNTER NOTE  I connected with [name] on 07/03/18 at  1:55 PM EDT by telephone at home and verified that I am speaking with the correct person using two identifiers.   I discussed the limitations, risks, security and privacy concerns of performing an evaluation and management service by telephone and the availability of in person appointments. I also discussed with the patient that there may be a patient responsible charge related to this service. The patient expressed understanding and agreed to proceed.   History:  [name] is a 28 y.o. G8P1011 female being evaluated today for desire for pregnancy and DUB. Pt last seen July 2019 with same concerned. Was placed on cyclic Prometrium. Pt reports no bleeding or cycles with Prometrium except this past Jan. Jan cycle last seven days. Sexual active.without problems. Partner 42 yrs old without medical problems and has not fathered any children Medical problems and medications have not change.      Past Medical History:  Diagnosis Date  . Acid reflux   . Asthma    last attack 03/13/15 or 03/14/15  . Autism   . 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 Chronic constipation   . Depression   . Drug-seeking behavior   . Essential tremor   . Headache   . Overdose of acetaminophen 07/2017   and other meds  . Personality disorder ([place])   . Schizo-affective psychosis ([place])   . Schizoaffective disorder, bipolar type ([place])   . Seizures [facility])    Last seizure December 2017   Past Surgical History:  Procedure Laterality Date  . MOUTH SURGERY  2009 or 2010   The following portions of the patient's history were reviewed and updated as appropriate: allergies, current medications, past family history, past medical history, past social history, past surgical history and problem list.      Review of Systems:  Pertinent items noted in HPI and remainder of comprehensive ROS otherwise negative.   Physical Exam:   General:  Alert, oriented and cooperative.   Mental Status: Normal mood and affect perceived. Normal judgment and thought content.  Physical exam deferred due to nature of the encounter  Labs and Imaging No results found for this or any previous visit (from the past 336 hour(s)). No results found.    Assessment and Plan:     1. DUB (dysfunctional uterine bleeding)  - US PELVIC COMPLETE WITH TRANSVAGINAL; Future  2. Desire for pregnancy Partner to be schedule for semen analysis. Will check labs as well F/U after above completed.  Further Tx plan as per test results.        I discussed the assessment and treatment plan with the patient. The patient was provided an opportunity to ask questions and all were answered. The patient agreed with the plan and demonstrated an understanding of the instructions.   The patient was advised to call back or seek an in-person evaluation/go to the ED if the symptoms worsen or if the condition fails to improve as anticipated.  I provided 15 minutes of non-face-to-face time during this encounter.   Chancy Milroy, MD [facility] [place], [place]

## 2018-08-05 ENCOUNTER — Other Ambulatory Visit: Payer: Self-pay | Admitting: Obstetrics and Gynecology

## 2018-08-05 DIAGNOSIS — N938 Other specified abnormal uterine and vaginal bleeding: Secondary | ICD-10-CM

## 2018-08-12 DIAGNOSIS — F250 Schizoaffective disorder, bipolar type: Secondary | ICD-10-CM | POA: Diagnosis not present

## 2018-09-05 DIAGNOSIS — R0681 Apnea, not elsewhere classified: Secondary | ICD-10-CM | POA: Diagnosis not present

## 2018-09-05 DIAGNOSIS — E669 Obesity, unspecified: Secondary | ICD-10-CM | POA: Diagnosis not present

## 2018-09-05 DIAGNOSIS — J4540 Moderate persistent asthma, uncomplicated: Secondary | ICD-10-CM | POA: Diagnosis not present

## 2018-09-05 DIAGNOSIS — G43909 Migraine, unspecified, not intractable, without status migrainosus: Secondary | ICD-10-CM | POA: Diagnosis not present

## 2018-09-16 DIAGNOSIS — R0681 Apnea, not elsewhere classified: Secondary | ICD-10-CM | POA: Diagnosis not present

## 2018-10-03 ENCOUNTER — Other Ambulatory Visit: Payer: Self-pay | Admitting: Neurology

## 2018-11-03 DIAGNOSIS — G4733 Obstructive sleep apnea (adult) (pediatric): Secondary | ICD-10-CM | POA: Diagnosis not present

## 2018-11-04 DIAGNOSIS — G4733 Obstructive sleep apnea (adult) (pediatric): Secondary | ICD-10-CM | POA: Diagnosis not present

## 2018-11-19 DIAGNOSIS — G4733 Obstructive sleep apnea (adult) (pediatric): Secondary | ICD-10-CM | POA: Diagnosis not present

## 2018-11-21 DIAGNOSIS — R600 Localized edema: Secondary | ICD-10-CM | POA: Diagnosis not present

## 2018-11-27 ENCOUNTER — Encounter (HOSPITAL_COMMUNITY): Payer: Self-pay | Admitting: Emergency Medicine

## 2018-11-27 ENCOUNTER — Other Ambulatory Visit: Payer: Self-pay

## 2018-11-27 ENCOUNTER — Emergency Department (HOSPITAL_COMMUNITY)
Admission: EM | Admit: 2018-11-27 | Discharge: 2018-11-28 | Discharge status: Against Medical Advice | Payer: PPO | Attending: Emergency Medicine | Admitting: Emergency Medicine

## 2018-11-27 DIAGNOSIS — Z87891 Personal history of nicotine dependence: Secondary | ICD-10-CM | POA: Insufficient documentation

## 2018-11-27 DIAGNOSIS — F329 Major depressive disorder, single episode, unspecified: Secondary | ICD-10-CM | POA: Diagnosis not present

## 2018-11-27 DIAGNOSIS — J45909 Unspecified asthma, uncomplicated: Secondary | ICD-10-CM | POA: Diagnosis not present

## 2018-11-27 DIAGNOSIS — R1111 Vomiting without nausea: Secondary | ICD-10-CM | POA: Diagnosis not present

## 2018-11-27 DIAGNOSIS — F332 Major depressive disorder, recurrent severe without psychotic features: Secondary | ICD-10-CM | POA: Diagnosis not present

## 2018-11-27 DIAGNOSIS — R1010 Upper abdominal pain, unspecified: Secondary | ICD-10-CM | POA: Diagnosis present

## 2018-11-27 DIAGNOSIS — Z79899 Other long term (current) drug therapy: Secondary | ICD-10-CM | POA: Insufficient documentation

## 2018-11-27 DIAGNOSIS — F840 Autistic disorder: Secondary | ICD-10-CM | POA: Insufficient documentation

## 2018-11-27 DIAGNOSIS — R112 Nausea with vomiting, unspecified: Secondary | ICD-10-CM | POA: Diagnosis not present

## 2018-11-27 DIAGNOSIS — R1084 Generalized abdominal pain: Secondary | ICD-10-CM | POA: Diagnosis not present

## 2018-11-27 DIAGNOSIS — R52 Pain, unspecified: Secondary | ICD-10-CM | POA: Diagnosis not present

## 2018-11-27 DIAGNOSIS — R110 Nausea: Secondary | ICD-10-CM | POA: Diagnosis not present

## 2018-11-27 DIAGNOSIS — F32A Depression, unspecified: Secondary | ICD-10-CM

## 2018-11-27 LAB — I-STAT BETA HCG BLOOD, ED (MC, WL, AP ONLY)
I-stat hCG, quantitative: <5 m[IU]/mL (ref <5)
I-stat hCG, quantitative: <5 m[IU]/mL (ref <5)

## 2018-11-27 LAB — URINALYSIS, ROUTINE W REFLEX MICROSCOPIC
Bilirubin Urine: NEGATIVE
Glucose, UA: NEGATIVE mg/dL
Hgb urine dipstick: NEGATIVE
Ketones, ur: NEGATIVE mg/dL
Leukocytes,Ua: NEGATIVE
Nitrite: NEGATIVE
Protein, ur: NEGATIVE mg/dL
Specific Gravity, Urine: 1.005 (ref 1.005–1.030)
pH: 7 (ref 5.0–8.0)

## 2018-11-27 MED ORDER — SODIUM CHLORIDE 0.9% FLUSH: 3.0000 mL | Freq: Once | INTRAVENOUS | Status: DC

## 2018-11-27 NOTE — ED Triage Notes (Signed)
Pt BIB [facility] from home, c/o nausea/vomiting x 2 days and abdominal pain x 1 day. Hx pancreatitis, denies alcohol use. EMS VSS.

## 2018-11-28 DIAGNOSIS — R1084 Generalized abdominal pain: Secondary | ICD-10-CM | POA: Diagnosis not present

## 2018-11-28 LAB — CBC WITH DIFFERENTIAL/PLATELET
Abs Immature Granulocytes: 0.01 10*3/uL (ref 0.00–0.07)
Basophils Absolute: 0 10*3/uL (ref 0.0–0.1)
Basophils Relative: 0 %
Eosinophils Absolute: 0 10*3/uL (ref 0.0–0.5)
Eosinophils Relative: 0 %
HCT: 38 % (ref 36.0–46.0)
Hemoglobin: 12 g/dL (ref 12.0–15.0)
Immature Granulocytes: 0 %
Lymphocytes Relative: 31 %
Lymphs Abs: 2.5 10*3/uL (ref 0.7–4.0)
MCH: 27 pg (ref 26.0–34.0)
MCHC: 31.6 g/dL (ref 30.0–36.0)
MCV: 85.4 fL (ref 80.0–100.0)
Monocytes Absolute: 0.4 10*3/uL (ref 0.1–1.0)
Monocytes Relative: 6 %
Neutro Abs: 4.9 10*3/uL (ref 1.7–7.7)
Neutrophils Relative %: 63 %
Platelets: 218 10*3/uL (ref 150–400)
RBC: 4.45 MIL/uL (ref 3.87–5.11)
RDW: 13.2 % (ref 11.5–15.5)
WBC: 7.9 10*3/uL (ref 4.0–10.5)
nRBC: 0 % (ref 0.0–0.2)

## 2018-11-28 LAB — COMPREHENSIVE METABOLIC PANEL
ALT: 11 U/L (ref 0–44)
AST: 15 U/L (ref 15–41)
Albumin: 3.8 g/dL (ref 3.5–5.0)
Alkaline Phosphatase: 72 U/L (ref 38–126)
Anion gap: 7 (ref 5–15)
BUN: 5 mg/dL — ABNORMAL LOW (ref 6–20)
CO2: 21 mmol/L — ABNORMAL LOW (ref 22–32)
Calcium: 8.9 mg/dL (ref 8.9–10.3)
Chloride: 114 mmol/L — ABNORMAL HIGH (ref 98–111)
Creatinine, Ser: 0.68 mg/dL (ref 0.44–1.00)
GFR calc Af Amer: >60 mL/min (ref >60)
GFR calc non Af Amer: >60 mL/min (ref >60)
Glucose, Bld: 97 mg/dL (ref 70–99)
Potassium: 3.3 mmol/L — ABNORMAL LOW (ref 3.5–5.1)
Sodium: 142 mmol/L (ref 135–145)
Total Bilirubin: 0.5 mg/dL (ref 0.3–1.2)
Total Protein: 6.4 g/dL — ABNORMAL LOW (ref 6.5–8.1)

## 2018-11-28 LAB — CBG MONITORING, ED: Glucose-Capillary: 88 mg/dL (ref 70–99)

## 2018-11-28 LAB — RAPID URINE DRUG SCREEN, HOSP PERFORMED
Amphetamines: NOT DETECTED
Barbiturates: NOT DETECTED
Benzodiazepines: NOT DETECTED
Cocaine: NOT DETECTED
Opiates: NOT DETECTED
Tetrahydrocannabinol: NOT DETECTED

## 2018-11-28 LAB — LIPASE, BLOOD: Lipase: 31 U/L (ref 11–51)

## 2018-11-28 MED ORDER — ALUM & MAG HYDROXIDE-SIMETH 200-200-20 MG/5ML PO SUSP
30.0000 mL | Freq: Once | ORAL | Status: AC
  Administered 2018-11-28: 30 mL via ORAL
  Filled 2018-11-28: qty 30

## 2018-11-28 MED ORDER — SODIUM CHLORIDE 0.9 % IV BOLUS
1000.0000 mL | Freq: Once | INTRAVENOUS | Status: AC
  Administered 2018-11-28: 1000 mL via INTRAVENOUS

## 2018-11-28 MED ORDER — ONDANSETRON HCL 4 MG/2ML IJ SOLN
4.0000 mg | Freq: Once | INTRAMUSCULAR | Status: AC
  Administered 2018-11-28: 4 mg via INTRAVENOUS
  Filled 2018-11-28: qty 2

## 2018-11-28 NOTE — Discharge Instructions (Signed)
Please follow up with [facility] and your ACT team

## 2018-11-28 NOTE — ED Notes (Signed)
Pt came up to staff complaining of feeling warm and light headed, staff rechecked her vitals and [facility] RN made aware.

## 2018-11-28 NOTE — Discharge Planning (Signed)
EDCM spoke with pt at bedside.  EDCM inquired about ACT team to contact on behalf of pt.  Pt states she will contact them when she gets home. Will await TTS recommendation.
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
## 2018-11-28 NOTE — ED Provider Notes (Signed)
[facility] EMERGENCY DEPARTMENT Provider Note   CSN: [id number] Arrival date & time: 11/27/18  2044     History   Chief Complaint Chief Complaint  Patient presents with  . Abdominal Pain    HPI [name] is a 28 y.o. female who presents with abdominal pain, nausea and vomiting.  Past medical history significant for schedule affective disorder, seizures, history of intentional overdose, drug-seeking behavior, hx of pancreatitis.  Patient for the past 2 to 3 days has had upper abdominal pain with associated nausea and vomiting.  She estimates she has been vomiting about 3 times a day.  She states that the pain is sharp and feels like when she had pancreatitis in the past.  She also reports "feeling warm" and that her arms are swollen.  She denies true fever, chest pain, shortness of breath, diarrhea, constipation, urinary symptoms, vaginal bleeding or discharge.  She denies any prior abdominal surgeries.  She has not tried anything for her symptoms.  She denies any drug or alcohol use.  She denies any recent intentional overdose of any medication. She was diagnosed and admitted with pancreatitis in Sep 2019 and etiology was unknown.     HPI  Past Medical History:  Diagnosis Date  . Acid reflux   . Asthma    last attack 03/13/15 or 03/14/15  . Autism   . 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 Chronic constipation   . Depression   . Drug-seeking behavior   . Essential tremor   . Headache   . Overdose of acetaminophen 07/2017   and other meds  . Personality disorder ([place])   . Schizo-affective psychosis ([place])   . Schizoaffective disorder, bipolar type ([place])   . Seizures [facility])    Last seizure December 2017    Patient Active Problem List   Diagnosis Date Noted  . Desire for pregnancy 07/03/2018  . Drug overdose 07/22/2017  . Overdose 07/22/2017  . Intentional acetaminophen overdose ([place])   . Metabolic acidosis   . DUB (dysfunctional uterine bleeding)  11/22/2016  . Hyperprolactinemia ([place]) 08/20/2016  . Tachycardia 01/13/2016  . 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  . Seizure disorder ([place]) 08/08/2015  . Chronic migraine 07/27/2015  . Asthma 04/15/2015  . Schizoaffective disorder, bipolar type ([place]) 03/10/2014  . Borderline personality disorder ([place]) 03/10/2014  . PTSD (post-traumatic stress disorder) 03/10/2014  . Autism spectrum disorder 06/15/2013    Past Surgical History:  Procedure Laterality Date  . MOUTH SURGERY  2009 or 2010     OB History    Gravida  2   Para  1   Term  1   Preterm      AB  1   Living  1     SAB  1   TAB      Ectopic      Multiple  0   Live Births  1            Home Medications    Prior to Admission medications   Medication Sig Start Date End Date Taking? Authorizing Provider  albuterol (PROVENTIL HFA;VENTOLIN HFA) 108 (90 Base) MCG/ACT inhaler Inhale 1-2 puffs into the lungs every 6 (six) hours as needed for wheezing or shortness of breath.    [provider]  fluticasone-salmeterol (ADVAIR HFA) 115-21 MCG/ACT inhaler Inhale 2 puffs into the lungs 2 (two) times daily. 08/03/15   Timmothy Euler, MD  predniSONE (STERAPRED UNI-PAK 21 TAB) 10 MG (21)  TBPK tablet As directed Patient not taking: Reported on 07/03/2018 06/24/18   Pieter Partridge, DO  risperiDONE (RISPERDAL) 2 MG tablet Take 2 mg by mouth daily.    [provider]  topiramate (TOPAMAX) 100 MG tablet TAKE 1 TABLET BY MOUTH TWICE A DAY 10/03/18   Pieter Partridge, DO    Family History Family History  Problem Relation Age of Onset  . Mental illness Father   . Asthma Father   . PDD Brother   . Seizures Brother     Social History Social History   Tobacco Use  . Smoking status: Former Smoker    Packs/day: 0.00  . Smokeless tobacco: Never Used  . Tobacco comment: Smoked for 2  years age [age]  Substance Use Topics  . Alcohol use: No    Alcohol/week: 1.0 standard drinks    Types: 1 Standard  drinks or equivalent per week  . Drug use: No    Comment: History of cocaine use at age 32 for 4 months     Allergies   Bee venom, Coconut flavor, Geodon [ziprasidone hcl], Haloperidol and related, Lithium, Oxycodone, Seroquel [quetiapine fumarate], Shellfish allergy, Phenergan [promethazine hcl], Prilosec [omeprazole], Sulfa antibiotics, and Tegretol [carbamazepine]   Review of Systems Review of Systems  Constitutional: Negative for fever.  Respiratory: Negative for shortness of breath.   Cardiovascular: Negative for chest pain.  Gastrointestinal: Positive for abdominal pain, nausea and vomiting. Negative for constipation, diarrhea and rectal pain.  Genitourinary: Negative for dysuria, frequency, pelvic pain and vaginal discharge.  All other systems reviewed and are negative.    Physical Exam Updated Vital Signs BP 116/64 (BP Location: Right Arm)   Pulse 92   Temp 98.3 F (36.8 C) (Oral)   Resp 16   SpO2 98%   Physical Exam Vitals signs and nursing note reviewed.  Constitutional:      General: She is not in acute distress.    Appearance: She is well-developed. She is obese. She is not ill-appearing.     Comments: Calm. Flat affect.   HENT:     Head: Normocephalic and atraumatic.  Eyes:     General: No scleral icterus.       Right eye: No discharge.        Left eye: No discharge.     Conjunctiva/sclera: Conjunctivae normal.     Pupils: Pupils are equal, round, and reactive to light.  Neck:     Musculoskeletal: Normal range of motion.  Cardiovascular:     Rate and Rhythm: Normal rate.  Pulmonary:     Effort: Pulmonary effort is normal. No respiratory distress.  Abdominal:     General: Abdomen is protuberant. Bowel sounds are normal. There is no distension.     Palpations: Abdomen is soft.     Tenderness: There is no abdominal tenderness.     Hernia: No hernia is present.  Skin:    General: Skin is warm and dry.  Neurological:     Mental Status: She is alert  and oriented to person, place, and time.  Psychiatric:        Behavior: Behavior normal.      ED Treatments / Results  Labs (all labs ordered are listed, but only abnormal results are displayed) Labs Reviewed  URINALYSIS, ROUTINE W REFLEX MICROSCOPIC - Abnormal; Notable for the following components:      Result Value   Color, Urine STRAW (*)    All other components within normal limits  COMPREHENSIVE METABOLIC PANEL -  Abnormal; Notable for the following components:   Potassium 3.3 (*)    Chloride 114 (*)    CO2 21 (*)    BUN 5 (*)    Total Protein 6.4 (*)    All other components within normal limits  CBC WITH DIFFERENTIAL/PLATELET  LIPASE, BLOOD  RAPID URINE DRUG SCREEN, HOSP PERFORMED  ETHANOL  SALICYLATE LEVEL  ACETAMINOPHEN LEVEL  I-STAT BETA HCG BLOOD, ED ([facility], [facility], AP ONLY)  I-STAT BETA HCG BLOOD, ED ([facility], [facility], AP ONLY)  CBG MONITORING, ED    EKG None  Radiology No results found.  Procedures Procedures (including critical care time)  Medications Ordered in ED Medications  sodium chloride flush (NS) 0.9 % injection 3 mL (has no administration in time range)  sodium chloride 0.9 % bolus 1,000 mL (0 mLs Intravenous Stopped 11/28/18 1104)  ondansetron (ZOFRAN) injection 4 mg (4 mg Intravenous Given 11/28/18 0915)  alum & mag hydroxide-simeth (MAALOX/MYLANTA) 200-200-20 MG/5ML suspension 30 mL (30 mLs Oral Given 11/28/18 0915)     Initial Impression / Assessment and Plan / ED Course  I have reviewed the triage vital signs and the nursing notes.  Pertinent labs & imaging results that were available during my care of the patient were reviewed by me and considered in my medical decision making (see chart for details).  28 year old female with upper abdominal pain, nausea and vomiting for several days.  Differential includes gastritis, PUD, pancreatitis, gallbladder disease.  Patient somewhat vague regarding her pain.  Her vitals are normal.  Her abdomen is soft and  nontender.  CBC is normal.  CMP is remarkable for mild hypokalemia 3.3.  LFTs and lipase are normal.  She is given fluids, Zofran, Mylanta.  I discussed results with patient and she states her nausea is better but she does not want to eat anything right now.  I encouraged her to try p.o. challenge.  Now patient has told nurses she is suicidal with a plan to OD on pills. I went and talked to patient shortly after and she states that she is not really suicidal she is just very stressed because she is trying to get pregnant and can't, her relationship with her boyfriend is not going well, and she is trying to get custody of her 62 year old daughter and is having difficulty. She states she would not kill herself and that she would not take any pills she just needs to talk to somebody. She sees [facility] and hasn't been able to see them. She does have an ACT team. Will have her talk to TTS due to expressing passive SI to nurses  Pt was recommended for inpatient tx. Pt is still adamantly denying SI to me. I discussed with the psych NP Takia who doesn't feel pt needs to be IVC'd. I do not feel pt needs IVC either as she has never expressed any SI to me. She would like to follow up with [facility] and her ACT team. She has tolerated PO here. Since pt was recommended to have inpatient tx will discharge AMA  Final Clinical Impressions(s) / ED Diagnoses   Final diagnoses:  Depression, unspecified depression type  Generalized abdominal pain    ED Discharge Orders    None       Recardo Evangelist, PA-C 11/28/18 1410    Davonna Belling, MD 11/28/18 [phone]

## 2018-11-28 NOTE — ED Notes (Signed)
Pt reports suicidal ideations. States plan is to "go home and take a bunch of pills." Patient belongings collected for inventory, pt changed into paper scrubs, room secured, security contacted for wanding.

## 2018-11-28 NOTE — ED Notes (Signed)
Belongings inventoried and documented. Valuables stored with security. Remaining belongings stored in purple locker 12.

## 2018-11-28 NOTE — Progress Notes (Signed)
Pt meets inpatient criteria per Jefferson Fuel, NP. Referral information has been sent to the following hospitals for review:  [facility]  [facility]       Disposition will continue to assist with inpatient placement needs.   Audree Camel, LCSW, [place] Disposition [facility]/TTS [phone]

## 2018-11-28 NOTE — ED Notes (Signed)
PA made aware pt is wanting to leave

## 2018-11-28 NOTE — ED Notes (Signed)
TTS complete 

## 2018-11-30 ENCOUNTER — Emergency Department (HOSPITAL_COMMUNITY): Payer: PPO

## 2018-11-30 ENCOUNTER — Other Ambulatory Visit: Payer: Self-pay

## 2018-11-30 ENCOUNTER — Encounter (HOSPITAL_COMMUNITY): Payer: Self-pay

## 2018-11-30 ENCOUNTER — Emergency Department (HOSPITAL_COMMUNITY)
Admission: EM | Admit: 2018-11-30 | Discharge: 2018-12-01 | Disposition: A | Discharge status: Home / Self Care | Payer: PPO | Attending: Emergency Medicine | Admitting: Emergency Medicine

## 2018-11-30 DIAGNOSIS — Z87891 Personal history of nicotine dependence: Secondary | ICD-10-CM | POA: Diagnosis not present

## 2018-11-30 DIAGNOSIS — Z79899 Other long term (current) drug therapy: Secondary | ICD-10-CM | POA: Diagnosis not present

## 2018-11-30 DIAGNOSIS — J45909 Unspecified asthma, uncomplicated: Secondary | ICD-10-CM | POA: Diagnosis not present

## 2018-11-30 DIAGNOSIS — R1084 Generalized abdominal pain: Secondary | ICD-10-CM | POA: Diagnosis not present

## 2018-11-30 DIAGNOSIS — K5900 Constipation, unspecified: Secondary | ICD-10-CM | POA: Diagnosis not present

## 2018-11-30 DIAGNOSIS — R109 Unspecified abdominal pain: Secondary | ICD-10-CM | POA: Diagnosis not present

## 2018-11-30 DIAGNOSIS — R1033 Periumbilical pain: Secondary | ICD-10-CM | POA: Diagnosis not present

## 2018-11-30 DIAGNOSIS — R52 Pain, unspecified: Secondary | ICD-10-CM | POA: Diagnosis not present

## 2018-11-30 DIAGNOSIS — R197 Diarrhea, unspecified: Secondary | ICD-10-CM | POA: Diagnosis not present

## 2018-11-30 LAB — CBC WITH DIFFERENTIAL/PLATELET
Abs Immature Granulocytes: 0.03 10*3/uL (ref 0.00–0.07)
Basophils Absolute: 0 10*3/uL (ref 0.0–0.1)
Basophils Relative: 0 %
Eosinophils Absolute: 0.1 10*3/uL (ref 0.0–0.5)
Eosinophils Relative: 1 %
HCT: 44.1 % (ref 36.0–46.0)
Hemoglobin: 13.4 g/dL (ref 12.0–15.0)
Immature Granulocytes: 0 %
Lymphocytes Relative: 33 %
Lymphs Abs: 2.7 10*3/uL (ref 0.7–4.0)
MCH: 26.4 pg (ref 26.0–34.0)
MCHC: 30.4 g/dL (ref 30.0–36.0)
MCV: 87 fL (ref 80.0–100.0)
Monocytes Absolute: 0.5 10*3/uL (ref 0.1–1.0)
Monocytes Relative: 6 %
Neutro Abs: 5 10*3/uL (ref 1.7–7.7)
Neutrophils Relative %: 60 %
Platelets: 227 10*3/uL (ref 150–400)
RBC: 5.07 MIL/uL (ref 3.87–5.11)
RDW: 13.2 % (ref 11.5–15.5)
WBC: 8.2 10*3/uL (ref 4.0–10.5)
nRBC: 0 % (ref 0.0–0.2)

## 2018-11-30 LAB — URINALYSIS, ROUTINE W REFLEX MICROSCOPIC
Bilirubin Urine: NEGATIVE
Glucose, UA: NEGATIVE mg/dL
Hgb urine dipstick: NEGATIVE
Ketones, ur: NEGATIVE mg/dL
Leukocytes,Ua: NEGATIVE
Nitrite: NEGATIVE
Protein, ur: NEGATIVE mg/dL
Specific Gravity, Urine: 1.004 — ABNORMAL LOW (ref 1.005–1.030)
pH: 7 (ref 5.0–8.0)

## 2018-11-30 LAB — COMPREHENSIVE METABOLIC PANEL
ALT: 14 U/L (ref 0–44)
AST: 15 U/L (ref 15–41)
Albumin: 4.7 g/dL (ref 3.5–5.0)
Alkaline Phosphatase: 86 U/L (ref 38–126)
Anion gap: 12 (ref 5–15)
BUN: 9 mg/dL (ref 6–20)
CO2: 17 mmol/L — ABNORMAL LOW (ref 22–32)
Calcium: 9.6 mg/dL (ref 8.9–10.3)
Chloride: 112 mmol/L — ABNORMAL HIGH (ref 98–111)
Creatinine, Ser: 0.64 mg/dL (ref 0.44–1.00)
GFR calc Af Amer: >60 mL/min (ref >60)
GFR calc non Af Amer: >60 mL/min (ref >60)
Glucose, Bld: 88 mg/dL (ref 70–99)
Potassium: 3.8 mmol/L (ref 3.5–5.1)
Sodium: 141 mmol/L (ref 135–145)
Total Bilirubin: 0.3 mg/dL (ref 0.3–1.2)
Total Protein: 8 g/dL (ref 6.5–8.1)

## 2018-11-30 LAB — I-STAT BETA HCG BLOOD, ED (MC, WL, AP ONLY): I-stat hCG, quantitative: <5 m[IU]/mL (ref <5)

## 2018-11-30 LAB — LIPASE, BLOOD: Lipase: 30 U/L (ref 11–51)

## 2018-11-30 LAB — POC OCCULT BLOOD, ED: Fecal Occult Bld: NEGATIVE

## 2018-11-30 MED ORDER — IOHEXOL 300 MG/ML  SOLN
100.0000 mL | Freq: Once | INTRAMUSCULAR | Status: AC | PRN
  Administered 2018-11-30: 100 mL via INTRAVENOUS

## 2018-11-30 NOTE — ED Notes (Signed)
Pt ambulated from stretcher in hallway to bed in room with steady gait.

## 2018-11-30 NOTE — ED Triage Notes (Signed)
Per EMS - Pt coming from home after having loose stool that she noticed bright red blood in and when she wiped. Also complains of abd pain x 3-4 days (seen at [facility] 10/22 for this). Pt states she was constipated last night and took a laxative without relief. A+Ox4 / ambulatory.  112 66 73 HR 100% RA 97.5 16R

## 2018-12-01 DIAGNOSIS — R109 Unspecified abdominal pain: Secondary | ICD-10-CM | POA: Diagnosis not present

## 2018-12-01 DIAGNOSIS — R197 Diarrhea, unspecified: Secondary | ICD-10-CM | POA: Diagnosis not present

## 2018-12-01 NOTE — Discharge Instructions (Signed)
1. Medications: usual home medications 2. Treatment: rest, drink plenty of fluids, advance diet slowly 3. Follow Up: Please followup with your primary doctor in 2 days and [facility] in 1 week for further evaluation of your symptoms; Please return to the ER for persistent vomiting, high fevers or worsening symptoms

## 2018-12-01 NOTE — ED Provider Notes (Signed)
[place] DEPT Provider Note   CSN: [id number] Arrival date & time: 11/30/18  1929     History   Chief Complaint Chief Complaint  Patient presents with  . Rectal Bleeding  . Abdominal Pain    HPI [name] is a 28 y.o. female.     HPI Patient presents to the emergency department with abdominal pain that started 3 to 4 days ago.  The patient states she was seen at [facility] 2 days ago.  The patient states that she feels like tonight she was somewhat constipated and then had a looser bowel movement after taking a laxative that she felt like had blood in it.  The patient states that nothing seems to make the condition better but palpation makes the pain worse.  The patient denies chest pain, shortness of breath, headache,blurred vision, neck pain, fever, cough, weakness, numbness, dizziness, anorexia, edema, nausea, vomiting, diarrhea, rash, back pain, dysuria, hematemesis, bloody stool, near syncope, or syncope. Past Medical History:  Diagnosis Date  . Acid reflux   . Asthma    last attack 03/13/15 or 03/14/15  . Autism   . 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 Chronic constipation   . Depression   . Drug-seeking behavior   . Essential tremor   . Headache   . Overdose of acetaminophen 07/2017   and other meds  . Personality disorder ([place])   . Schizo-affective psychosis ([place])   . Schizoaffective disorder, bipolar type ([place])   . Seizures [facility])    Last seizure December 2017    Patient Active Problem List   Diagnosis Date Noted  . Desire for pregnancy 07/03/2018  . Drug overdose 07/22/2017  . Overdose 07/22/2017  . Intentional acetaminophen overdose ([place])   . Metabolic acidosis   . DUB (dysfunctional uterine bleeding) 11/22/2016  . Hyperprolactinemia ([place]) 08/20/2016  . Tachycardia 01/13/2016  . 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  . Seizure disorder ([place]) 08/08/2015  . Chronic migraine 07/27/2015  . Asthma 04/15/2015  .  Schizoaffective disorder, bipolar type ([place]) 03/10/2014  . Borderline personality disorder ([place]) 03/10/2014  . PTSD (post-traumatic stress disorder) 03/10/2014  . Autism spectrum disorder 06/15/2013    Past Surgical History:  Procedure Laterality Date  . MOUTH SURGERY  2009 or 2010     OB History    Gravida  2   Para  1   Term  1   Preterm      AB  1   Living  1     SAB  1   TAB      Ectopic      Multiple  0   Live Births  1            Home Medications    Prior to Admission medications   Medication Sig Start Date End Date Taking? Authorizing Provider  albuterol (PROVENTIL HFA;VENTOLIN HFA) 108 (90 Base) MCG/ACT inhaler Inhale 1-2 puffs into the lungs every 6 (six) hours as needed for wheezing or shortness of breath.   Yes [provider]  fluticasone-salmeterol (ADVAIR HFA) 115-21 MCG/ACT inhaler Inhale 2 puffs into the lungs 2 (two) times daily. 08/03/15  Yes Timmothy Euler, MD  risperiDONE (RISPERDAL) 2 MG tablet Take 2 mg by mouth daily.   Yes [provider]  topiramate (TOPAMAX) 100 MG tablet TAKE 1 TABLET BY MOUTH TWICE A DAY Patient taking differently: Take 100 mg by mouth 2 (two) times daily.  10/03/18  Yes  Tomi Likens, Adam R, DO  predniSONE (STERAPRED UNI-PAK 21 TAB) 10 MG (21) TBPK tablet As directed Patient not taking: Reported on 07/03/2018 06/24/18   Pieter Partridge, DO    Family History Family History  Problem Relation Age of Onset  . Mental illness Father   . Asthma Father   . PDD Brother   . Seizures Brother     Social History Social History   Tobacco Use  . Smoking status: Former Smoker    Packs/day: 0.00  . Smokeless tobacco: Never Used  . Tobacco comment: Smoked for 2  years age [age]  Substance Use Topics  . Alcohol use: No    Alcohol/week: 1.0 standard drinks    Types: 1 Standard drinks or equivalent per week  . Drug use: No    Comment: History of cocaine use at age 61 for 4 months     Allergies   Bee  venom, Coconut flavor, Geodon [ziprasidone hcl], Haloperidol and related, Lithium, Oxycodone, Seroquel [quetiapine fumarate], Shellfish allergy, Phenergan [promethazine hcl], Prilosec [omeprazole], Sulfa antibiotics, and Tegretol [carbamazepine]   Review of Systems Review of Systems All other systems negative except as documented in the HPI. All pertinent positives and negatives as reviewed in the HPI.  Physical Exam Updated Vital Signs BP 108/61 (BP Location: Left Arm)   Pulse 77   Temp 98.1 F (36.7 C) (Oral)   Resp 18   Ht 5\' 5"  (1.651 m)   Wt 83.9 kg   LMP  (LMP Unknown)   SpO2 100%   BMI 30.79 kg/m   Physical Exam Vitals signs and nursing note reviewed.  Constitutional:      General: She is not in acute distress.    Appearance: She is well-developed.  HENT:     Head: Normocephalic and atraumatic.  Eyes:     Pupils: Pupils are equal, round, and reactive to light.  Neck:     Musculoskeletal: Normal range of motion and neck supple.  Cardiovascular:     Rate and Rhythm: Normal rate and regular rhythm.     Heart sounds: Normal heart sounds. No murmur. No friction rub. No gallop.   Pulmonary:     Effort: Pulmonary effort is normal. No respiratory distress.     Breath sounds: Normal breath sounds. No wheezing.  Abdominal:     General: Bowel sounds are normal. There is no distension.     Palpations: Abdomen is soft.     Tenderness: There is abdominal tenderness in the periumbilical area.  Skin:    General: Skin is warm and dry.     Capillary Refill: Capillary refill takes less than 2 seconds.     Findings: No erythema or rash.  Neurological:     Mental Status: She is alert and oriented to person, place, and time.     Motor: No abnormal muscle tone.     Coordination: Coordination normal.  Psychiatric:        Behavior: Behavior normal.      ED Treatments / Results  Labs (all labs ordered are listed, but only abnormal results are displayed) Labs Reviewed   COMPREHENSIVE METABOLIC PANEL - Abnormal; Notable for the following components:      Result Value   Chloride 112 (*)    CO2 17 (*)    All other components within normal limits  URINALYSIS, ROUTINE W REFLEX MICROSCOPIC - Abnormal; Notable for the following components:   Color, Urine STRAW (*)    Specific Gravity, Urine 1.004 (*)  All other components within normal limits  CBC WITH DIFFERENTIAL/PLATELET  LIPASE, BLOOD  OCCULT BLOOD X 1 CARD TO LAB, STOOL  I-STAT BETA HCG BLOOD, ED ([facility], [facility], [facility] ONLY)  POC OCCULT BLOOD, ED    EKG None  Radiology No results found.  Procedures Procedures (including critical care time)  Medications Ordered in ED Medications  iohexol (OMNIPAQUE) 300 MG/ML solution 100 mL (100 mLs Intravenous Contrast Given 11/30/18 2355)     Initial Impression / Assessment and Plan / ED Course  I have reviewed the triage vital signs and the nursing notes.  Pertinent labs & imaging results that were available during my care of the patient were reviewed by me and considered in my medical decision making (see chart for details).       Patient will have CT scan to further assess her abdominal pain.  She is not had a recent imaging of her abdomen.  This abdominal pain however appears more chronic in nature.  As the CT scan does not show any abnormalities I will refer her to GI. Final Clinical Impressions(s) / ED Diagnoses   Final diagnoses:  None    ED Discharge Orders    None       Dalia Heading, PA-C 12/01/18 0013    Lajean Saver, MD 12/09/18 1013

## 2018-12-01 NOTE — ED Provider Notes (Signed)
Care assumed from [facility], PA-C.  Please see his full H&P.  In short,  [name] is a 28 y.o. female presents for generalized abdominal pain onset several days ago.  Was seen at [facility] 2 days ago with reassuring work-up.  No red flags.  Abdomen soft with mild periumbilical tenderness for the initial provider  Physical Exam  BP 110/74   Pulse 85   Temp 98.1 F (36.7 C) (Oral)   Resp 17   Ht 5\' 5"  (1.651 m)   Wt 83.9 kg   LMP  (LMP Unknown)   SpO2 99%   BMI 30.79 kg/m   Physical Exam Vitals signs and nursing note reviewed.  Constitutional:      General: She is not in acute distress.    Appearance: She is well-developed.  HENT:     Head: Normocephalic.  Eyes:     General: No scleral icterus.    Conjunctiva/sclera: Conjunctivae normal.  Neck:     Musculoskeletal: Normal range of motion.  Cardiovascular:     Rate and Rhythm: Normal rate.  Pulmonary:     Effort: Pulmonary effort is normal.  Musculoskeletal: Normal range of motion.  Skin:    General: Skin is warm and dry.  Neurological:     Mental Status: She is alert.     ED Course/Procedures   Clinical Course as of Dec 01 155  Mon Dec 01, 2018  0030 Plan: CT scan pending if negative, patient will need close follow-up with GI.   [HM]  0155 CT scan without acute abnormality.  CT Abdomen Pelvis W Contrast [HM]    Clinical Course User Index [HM] Mccrae Speciale, [name] Perking    Procedures  MDM    Several days of generalized abdominal pain.  Exam reassuring.  Labs reassuring.  CT scan without acute abnormality.  Patient is established with [facility].  We will have her follow with both primary care and GI for further evaluation.  Discussed reasons to return immediately to the emergency department.  Patient states understanding and is in agreement with the plan.  Generalized abdominal pain     [name], [name] Perking 12/01/18 0157    Ezequiel Essex, MD 12/01/18 [phone]

## 2018-12-12 DIAGNOSIS — K219 Gastro-esophageal reflux disease without esophagitis: Secondary | ICD-10-CM | POA: Diagnosis not present

## 2018-12-12 DIAGNOSIS — R1033 Periumbilical pain: Secondary | ICD-10-CM | POA: Diagnosis not present

## 2018-12-12 DIAGNOSIS — J4540 Moderate persistent asthma, uncomplicated: Secondary | ICD-10-CM | POA: Diagnosis not present

## 2018-12-12 DIAGNOSIS — F2089 Other schizophrenia: Secondary | ICD-10-CM | POA: Diagnosis not present

## 2018-12-12 DIAGNOSIS — F3130 Bipolar disorder, current episode depressed, mild or moderate severity, unspecified: Secondary | ICD-10-CM | POA: Diagnosis not present

## 2018-12-18 DIAGNOSIS — G4733 Obstructive sleep apnea (adult) (pediatric): Secondary | ICD-10-CM | POA: Diagnosis not present

## 2018-12-20 DIAGNOSIS — G4733 Obstructive sleep apnea (adult) (pediatric): Secondary | ICD-10-CM | POA: Diagnosis not present

## 2019-01-05 ENCOUNTER — Telehealth: Payer: Self-pay | Admitting: Neurology

## 2019-01-05 MED ORDER — NORTRIPTYLINE HCL 10 MG PO CAPS: 10.0000 mg | ORAL_CAPSULE | Freq: Every day | ORAL | 3 refills | Status: DC

## 2019-01-05 NOTE — Telephone Encounter (Signed)
I often recommend taking these supplements.  The riboflavin may need to be purchased at a specialty vitamin shop or on [employer]. Magnesium citrate 400mg  daily Riboflavin (vitamin B2) 400mg  daily Coenzyme Q-10 100mg  three times daily

## 2019-01-05 NOTE — Telephone Encounter (Signed)
Called patient she was informed of provider response. She would like to start Rx.  Rx was sent to pharmacy Pt sent to front desk to make 3-4 month follow up

## 2019-01-05 NOTE — Telephone Encounter (Signed)
Called spoke with patient she was informed of provider response and will try

## 2019-01-05 NOTE — Telephone Encounter (Signed)
Any recommendations for home remedies for migraines?

## 2019-01-05 NOTE — Telephone Encounter (Signed)
Patient called back and said that she spoke with someone over at the doctor's office and they said that the migraines were not coming from the sleep apnea machine. She just wanted to let yall know. Thanks!

## 2019-01-05 NOTE — Telephone Encounter (Signed)
Spoke with patient she states that she is having migraines almost every day for the last week. Notice 40 mins after using sleep apnea machine migraines last until next day around 1-2 pm.  Currently Topamax 100 mg 1-2 times a day

## 2019-01-05 NOTE — Telephone Encounter (Signed)
Patient left msg with after hours about experiencing more migraines than normal. She started using the sleep apnea machine in October and not sure if that is causing the increase. Thanks!

## 2019-01-05 NOTE — Telephone Encounter (Signed)
The only way to tell if it is related to the CPAP is to stop using it for a few nights and see if headaches resolve.  Otherwise, we can add nortriptyline 10mg  at bedtime (in addition to topiramate 100mg , which she is supposed to be taking twice a day).  We can increase dose of nortriptyline to 25mg  at bedtime in 4 weeks if needed (she should contact us).  She should schedule an appointment in 3 to 4 months (VV okay).

## 2019-01-19 ENCOUNTER — Encounter (HOSPITAL_COMMUNITY): Payer: Self-pay

## 2019-01-19 ENCOUNTER — Emergency Department (HOSPITAL_COMMUNITY): Payer: PPO

## 2019-01-19 ENCOUNTER — Emergency Department (HOSPITAL_COMMUNITY)
Admission: EM | Admit: 2019-01-19 | Discharge: 2019-01-19 | Disposition: A | Discharge status: Home / Self Care | Payer: PPO | Attending: Emergency Medicine | Admitting: Emergency Medicine

## 2019-01-19 ENCOUNTER — Other Ambulatory Visit: Payer: Self-pay

## 2019-01-19 DIAGNOSIS — R202 Paresthesia of skin: Secondary | ICD-10-CM | POA: Diagnosis not present

## 2019-01-19 DIAGNOSIS — Z87891 Personal history of nicotine dependence: Secondary | ICD-10-CM | POA: Insufficient documentation

## 2019-01-19 DIAGNOSIS — R0789 Other chest pain: Secondary | ICD-10-CM | POA: Diagnosis not present

## 2019-01-19 DIAGNOSIS — R079 Chest pain, unspecified: Secondary | ICD-10-CM | POA: Diagnosis not present

## 2019-01-19 DIAGNOSIS — Z79899 Other long term (current) drug therapy: Secondary | ICD-10-CM | POA: Insufficient documentation

## 2019-01-19 DIAGNOSIS — R072 Precordial pain: Secondary | ICD-10-CM | POA: Insufficient documentation

## 2019-01-19 DIAGNOSIS — R110 Nausea: Secondary | ICD-10-CM | POA: Diagnosis not present

## 2019-01-19 DIAGNOSIS — J45909 Unspecified asthma, uncomplicated: Secondary | ICD-10-CM | POA: Insufficient documentation

## 2019-01-19 DIAGNOSIS — R450 Nervousness: Secondary | ICD-10-CM | POA: Diagnosis not present

## 2019-01-19 DIAGNOSIS — G4733 Obstructive sleep apnea (adult) (pediatric): Secondary | ICD-10-CM | POA: Diagnosis not present

## 2019-01-19 LAB — CBC
HCT: 40.7 % (ref 36.0–46.0)
Hemoglobin: 12.6 g/dL (ref 12.0–15.0)
MCH: 26.6 pg (ref 26.0–34.0)
MCHC: 31 g/dL (ref 30.0–36.0)
MCV: 86 fL (ref 80.0–100.0)
Platelets: 228 10*3/uL (ref 150–400)
RBC: 4.73 MIL/uL (ref 3.87–5.11)
RDW: 13.2 % (ref 11.5–15.5)
WBC: 10.1 10*3/uL (ref 4.0–10.5)
nRBC: 0 % (ref 0.0–0.2)

## 2019-01-19 LAB — BASIC METABOLIC PANEL
Anion gap: 7 (ref 5–15)
BUN: 14 mg/dL (ref 6–20)
CO2: 18 mmol/L — ABNORMAL LOW (ref 22–32)
Calcium: 9 mg/dL (ref 8.9–10.3)
Chloride: 110 mmol/L (ref 98–111)
Creatinine, Ser: 0.85 mg/dL (ref 0.44–1.00)
GFR calc Af Amer: >60 mL/min (ref >60)
GFR calc non Af Amer: >60 mL/min (ref >60)
Glucose, Bld: 109 mg/dL — ABNORMAL HIGH (ref 70–99)
Potassium: 5.1 mmol/L (ref 3.5–5.1)
Sodium: 135 mmol/L (ref 135–145)

## 2019-01-19 LAB — I-STAT BETA HCG BLOOD, ED (MC, WL, AP ONLY): I-stat hCG, quantitative: <5 m[IU]/mL (ref <5)

## 2019-01-19 LAB — D-DIMER, QUANTITATIVE: D-Dimer, Quant: <0.27 ug/mL-FEU (ref 0.00–0.50)

## 2019-01-19 LAB — TROPONIN I (HIGH SENSITIVITY)
Troponin I (High Sensitivity): <2 ng/L (ref <18)
Troponin I (High Sensitivity): <2 ng/L (ref <18)

## 2019-01-19 MED ORDER — SODIUM CHLORIDE 0.9 % IV BOLUS (SEPSIS)
1000.0000 mL | Freq: Once | INTRAVENOUS | Status: AC
  Administered 2019-01-19: 1000 mL via INTRAVENOUS

## 2019-01-19 MED ORDER — SODIUM CHLORIDE 0.9% FLUSH
3.0000 mL | Freq: Once | INTRAVENOUS | Status: AC
  Administered 2019-01-19: 3 mL via INTRAVENOUS

## 2019-01-19 MED ORDER — KETOROLAC TROMETHAMINE 30 MG/ML IJ SOLN
30.0000 mg | Freq: Once | INTRAMUSCULAR | Status: AC
  Administered 2019-01-19: 30 mg via INTRAVENOUS
  Filled 2019-01-19: qty 1

## 2019-01-19 NOTE — ED Provider Notes (Signed)
[place] DEPT Provider Note   CSN: [id number] Arrival date & time: 01/19/19  0203     History Chief Complaint  Patient presents with  . Chest Pain    [name] is a 28 y.o. female.   Chest Pain Associated symptoms: fever and vomiting     HPI: A 28 year old patient presents for evaluation of chest pain. Initial onset of pain was approximately 3-6 hours ago. The patient's chest pain is described as heaviness/pressure/tightness and is not worse with exertion. The patient complains of nausea. The patient's chest pain is middle- or left-sided, is not well-localized, is not sharp and does not radiate to the arms/jaw/neck. The patient denies diaphoresis. The patient has no history of stroke, has no history of peripheral artery disease, has not smoked in the past 90 days, denies any history of treated diabetes, has no relevant family history of coronary artery disease (first degree relative at less than age 38), is not hypertensive, has no history of hypercholesterolemia and does not have an elevated BMI (>=30).  Patient with history of schizoaffective disorder presents with chest pain.  She reports for the past several days she has had chest pain that seems to worsen at night and goes from left to center of her chest.  She reports shortness of breath.  She reports recent fevers and vomiting.  She reports pain with deep breathing.  She thought she might be pregnant. Past Medical History:  Diagnosis Date  . Acid reflux   . Asthma    last attack 03/13/15 or 03/14/15  . Autism   . 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 Chronic constipation   . Depression   . Drug-seeking behavior   . Essential tremor   . Headache   . Overdose of acetaminophen 07/2017   and other meds  . Personality disorder ([place])   . Schizo-affective psychosis ([place])   . Schizoaffective disorder, bipolar type ([place])   . Seizures [facility])    Last seizure December 2017    Patient Active  Problem List   Diagnosis Date Noted  . Desire for pregnancy 07/03/2018  . Drug overdose 07/22/2017  . Overdose 07/22/2017  . Intentional acetaminophen overdose ([place])   . Metabolic acidosis   . DUB (dysfunctional uterine bleeding) 11/22/2016  . Hyperprolactinemia ([place]) 08/20/2016  . Tachycardia 01/13/2016  . 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  . Seizure disorder ([place]) 08/08/2015  . Chronic migraine 07/27/2015  . Asthma 04/15/2015  . Schizoaffective disorder, bipolar type ([place]) 03/10/2014  . Borderline personality disorder ([place]) 03/10/2014  . PTSD (post-traumatic stress disorder) 03/10/2014  . Autism spectrum disorder 06/15/2013    Past Surgical History:  Procedure Laterality Date  . MOUTH SURGERY  2009 or 2010     OB History    Gravida  2   Para  1   Term  1   Preterm      AB  1   Living  1     SAB  1   TAB      Ectopic      Multiple  0   Live Births  1           Family History  Problem Relation Age of Onset  . Mental illness Father   . Asthma Father   . PDD Brother   . Seizures Brother     Social History   Tobacco Use  . Smoking status: Former Smoker    Packs/day: 0.00  .  Smokeless tobacco: Never Used  . Tobacco comment: Smoked for 2  years age [age]  Substance Use Topics  . Alcohol use: No    Alcohol/week: 1.0 standard drinks    Types: 1 Standard drinks or equivalent per week  . Drug use: No    Comment: History of cocaine use at age 40 for 4 months    Home Medications Prior to Admission medications   Medication Sig Start Date End Date Taking? Authorizing Provider  albuterol (PROVENTIL HFA;VENTOLIN HFA) 108 (90 Base) MCG/ACT inhaler Inhale 1-2 puffs into the lungs every 6 (six) hours as needed for wheezing or shortness of breath.   Yes [provider]  fluticasone-salmeterol (ADVAIR HFA) 115-21 MCG/ACT inhaler Inhale 2 puffs into the lungs 2 (two) times daily. 08/03/15  Yes Timmothy Euler, MD  risperiDONE (RISPERDAL)  2 MG tablet Take 2 mg by mouth daily.   Yes [provider]  topiramate (TOPAMAX) 100 MG tablet TAKE 1 TABLET BY MOUTH TWICE A DAY Patient taking differently: Take 100 mg by mouth 2 (two) times daily.  10/03/18  Yes Tomi Likens, Adam R, DO  nortriptyline (PAMELOR) 10 MG capsule Take 1 capsule (10 mg total) by mouth at bedtime. Patient not taking: Reported on 01/19/2019 01/05/19   Pieter Partridge, DO  predniSONE (STERAPRED UNI-PAK 21 TAB) 10 MG (21) TBPK tablet As directed Patient not taking: Reported on 07/03/2018 06/24/18   Pieter Partridge, DO    Allergies    Bee venom, Coconut flavor, Geodon [ziprasidone hcl], Haloperidol and related, Lithium, Oxycodone, Seroquel [quetiapine fumarate], Shellfish allergy, Phenergan [promethazine hcl], Prilosec [omeprazole], Sulfa antibiotics, and Tegretol [carbamazepine]  Review of Systems   Review of Systems  Constitutional: Positive for fever.  Cardiovascular: Positive for chest pain.  Gastrointestinal: Positive for vomiting.  Psychiatric/Behavioral: The patient is nervous/anxious.   All other systems reviewed and are negative.   Physical Exam Updated Vital Signs BP (!) 117/59   Pulse 88   Temp 99.2 F (37.3 C) (Oral)   Resp (!) 21   Ht 1.651 m (5\' 5" )   Wt 83.9 kg   LMP 12/13/2018   SpO2 100%   BMI 30.79 kg/m   Physical Exam CONSTITUTIONAL: Anxious appears distracted HEAD: Normocephalic/atraumatic EYES: EOMI/PERRL ENMT: Mucous membranes moist NECK: supple no meningeal signs SPINE/BACK:entire spine nontender CV: S1/S2 noted, mild tachycardia LUNGS: Lungs are clear to auscultation bilaterally, no apparent distress ABDOMEN: soft, nontender, no rebound or guarding, bowel sounds noted throughout abdomen GU:no cva tenderness NEURO: Pt is awake/alert/appropriate, moves all extremitiesx4.  No facial droop.   EXTREMITIES: pulses normal/equal, full ROM, no calf tenderness or edema SKIN: warm, color normal PSYCH: Anxious  ED Results /  Procedures / Treatments   Labs (all labs ordered are listed, but only abnormal results are displayed) Labs Reviewed  BASIC METABOLIC PANEL - Abnormal; Notable for the following components:      Result Value   CO2 18 (*)    Glucose, Bld 109 (*)    All other components within normal limits  CBC  D-DIMER, QUANTITATIVE (NOT AT [facility])  I-STAT BETA HCG BLOOD, ED ([facility], [facility], [facility] ONLY)  TROPONIN I (HIGH SENSITIVITY)  TROPONIN I (HIGH SENSITIVITY)    EKG EKG Interpretation  Date/Time:  Monday January 19 2019 02:15:52 EST Ventricular Rate:  98 PR Interval:    QRS Duration: 88 QT Interval:  342 QTC Calculation: 437 R Axis:   43 Text Interpretation: Sinus rhythm Borderline short PR interval Inferior infarct, old No significant change  since last tracing Confirmed by Ripley Fraise [phone]) on 01/19/2019 3:11:04 AM   Radiology DG Chest 2 View  Result Date: 01/19/2019 CLINICAL DATA:  Chest pain. EXAM: CHEST - 2 VIEW COMPARISON:  09/30/2017 FINDINGS: The cardiomediastinal contours are normal. The lungs are clear. Pulmonary vasculature is normal. No consolidation, pleural effusion, or pneumothorax. No acute osseous abnormalities are seen. IMPRESSION: Normal radiographs of the chest. Electronically Signed   By: Keith Rake M.D.   On: 01/19/2019 02:29    Procedures Procedures   Medications Ordered in ED Medications  sodium chloride 0.9 % bolus 1,000 mL (1,000 mLs Intravenous New Bag/Given 01/19/19 0455)  sodium chloride flush (NS) 0.9 % injection 3 mL (3 mLs Intravenous Given 01/19/19 0256)  ketorolac (TORADOL) 30 MG/ML injection 30 mg (30 mg Intravenous Given 01/19/19 0456)    ED Course  I have reviewed the triage vital signs and the nursing notes.  Pertinent labs & imaging results that were available during my care of the patient were reviewed by me and considered in my medical decision making (see chart for details).    MDM Rules/Calculators/A&P HEAR Score: 2                       5:13 AM Patient is a challenging historian who presents with chest pain.  She reports it seems to be worse at night No acute EKG changes.  X-rays negative.  D-dimer negative.  Repeat troponin pending 6:20 AM All work-up in the ED is negative. Patient reports feeling improved. Will discharge home Final Clinical Impression(s) / ED Diagnoses Final diagnoses:  Precordial pain    Rx / DC Orders ED Discharge Orders    None       Ripley Fraise, MD 01/19/19 [phone]

## 2019-01-19 NOTE — Discharge Instructions (Addendum)

## 2019-01-19 NOTE — ED Triage Notes (Signed)
Patient arrived stating she is having chest pain, nausea, vomiting, and numbness/tingling in her fingertips for the last three days. 650 Tylenol given by EMS.

## 2019-01-22 ENCOUNTER — Telehealth: Payer: Self-pay | Admitting: Neurology

## 2019-01-22 DIAGNOSIS — G4733 Obstructive sleep apnea (adult) (pediatric): Secondary | ICD-10-CM | POA: Diagnosis not present

## 2019-01-22 NOTE — Telephone Encounter (Signed)
Are these side effects to medication? See below

## 2019-01-22 NOTE — Telephone Encounter (Signed)
Patient called with concerns about mood changes, stomach pain, chest pain, and aggression. She said she thinks she is having side-effects from the topiramate 100 MG she is taking.  [employer] for pharmaceuticals, if needed.

## 2019-01-22 NOTE — Telephone Encounter (Signed)
Patient called and scheduled a virtual visit on 01/29/19 at 10:10 AM.

## 2019-01-22 NOTE — Telephone Encounter (Signed)
Yes, it's possible.  She has been on this dose of topiramate for a long time.  So if these are recent symptoms, I would suspect not.  However, if she really wants to discontinue topiramate, she should make a follow up appointment with me, virtual visit is fine.

## 2019-01-22 NOTE — Telephone Encounter (Signed)
Spoke with patient she was informed of provider message and would like to stop taken the topiramate. Requesting something else to take for headahces until then. Pt was sent to front desk to schedule appt.  She states that theses symptoms has been ongoing for a while but starting to get worst.

## 2019-01-23 ENCOUNTER — Telehealth: Payer: Self-pay | Admitting: Neurology

## 2019-01-23 NOTE — Telephone Encounter (Signed)
Spoke with patient and needs VV before medication is changed

## 2019-01-23 NOTE — Telephone Encounter (Signed)
noted 

## 2019-01-23 NOTE — Telephone Encounter (Signed)
Patient called in regarding Dr.Jaffe having her stop taking her Topamax. She is wanting the pharmacy to know not to fill it for her. She said he is wanting to start her on a new medication? She uses [facility]. Thank you

## 2019-01-26 DIAGNOSIS — G43709 Chronic migraine without aura, not intractable, without status migrainosus: Secondary | ICD-10-CM | POA: Diagnosis not present

## 2019-01-26 DIAGNOSIS — J4540 Moderate persistent asthma, uncomplicated: Secondary | ICD-10-CM | POA: Diagnosis not present

## 2019-01-26 DIAGNOSIS — F209 Schizophrenia, unspecified: Secondary | ICD-10-CM | POA: Diagnosis not present

## 2019-01-26 DIAGNOSIS — G4733 Obstructive sleep apnea (adult) (pediatric): Secondary | ICD-10-CM | POA: Diagnosis not present

## 2019-01-28 ENCOUNTER — Encounter: Payer: Self-pay | Admitting: Neurology

## 2019-01-28 NOTE — Progress Notes (Addendum)
Virtual Visit via Video Note The purpose of this virtual visit is to provide medical care while limiting exposure to the novel coronavirus.    Consent was obtained for video visit:  Yes.   Answered questions that patient had about telehealth interaction:  Yes.   I discussed the limitations, risks, security and privacy concerns of performing an evaluation and management service by telemedicine. I also discussed with the patient that there may be a patient responsible charge related to this service. The patient expressed understanding and agreed to proceed.  Pt location: Home Physician Location: Office Name of referring provider:  Kathyrn Lass, MD I connected with [name] at patients initiation/request on 01/29/2019 at  8:50 AM EST by video enabled telemedicine application and verified that I am speaking with the correct person using two identifiers. Pt MRN:  [id number] Pt DOB:  12-02-1990 Video Participants:  [name]   History of Present Illness:  [name] is a 28 year old right-handed female with schizoaffective disorder, migraine and asthma who presents for migraines and seizure disorder. She is accompanied by her mother-in-law who supplements history.  MIGRAINES: Update: In October, she started using a CPAP and began experiencing increased migraines.  She was  Uncertain if the machine was contributing to this increased frequency.  She also reports multiple symptoms such as mood changes, abdominal pain, chest pain and aggression and was wondering if it could be side effects from topiramate.  She has since stopped topiramate.  She has been feeling well.    I had prescribed her nortriptyline 10mg  but she didn't want to take due to side effects.  Intensity: Moderate to severe Duration: several hours Frequency:20 days a month Current NSAIDS: ibuprofen Current analgesics:no Current triptans: no Current anti-emetic: no Current muscle relaxants:  no Current anti-anxiolytic: no Current sleep aide: no Current Antihypertensive medications: no Current Antidepressant/mood medications: risperidone Current Anticonvulsant medications: none Current Vitamins/Herbal/Supplements: prenatal Current Antihistamines/Decongestants: no Other therapy: no Birth Control:no Other medication: risperidone 2mg   Depression/anxiety: no Sleep hygiene: Good Exercise: No  Hydration: yes  History: Onset: Teenager Location: varies Quality: pounding Initial intensity: 10/10 Aura: no Prodrome: no Postdrome: no Associated symptoms: Photophobia, phonophobia. Sometimes nausea. She has not had any new worse headache of her life, waking up from sleep Initial duration: All day Initial Frequency: 3 to 5 days per week Initial Frequency of abortive medication: 3 to 5 days per week Triggers: None Relieving factors: None Activity: Cannot function.  Past NSAIDS: ibuprofen Past analgesics: no Past abortive triptans: no Past muscle relaxants: no Past anti-emetic: promethazine 25mg  Past antihypertensive medications: metoprolol Past antidepressant medications: sertraline Past anticonvulsant medications: lamotrigine 50mg , Keppra 500mg  twice daily; Depakote (for seizure prophylaxis); topiramate 100mg  twice daily (irritability, GI upset) Past vitamins/Herbal/Supplements: no Other past therapies: no  SEIZURE DISORDER: Update: She reportedly discontinued Keppra because she wished to no longer be on it since she has not had any recurrent seizures. Current antiepileptic medication: none  History: She had a concussion on 04/15/15 after falling out of bed and hitting her head on the floor. She reports loss of consciousness for about a minute. She had nausea and vomiting and went to the ED where CT of head was unremarkable. She soon developed new onset seizures. They have been unwitnessed. She loses consciousness for less  than a minute. She does not bite her tongue or have incontinence. They were occurring about 2 to 3 times a month. She initially was on Depakote, which was subsequently changed to  Keppra when she became pregnant. She has responded well to [place]. Her last seizure was in December 2017. After giving birth in February 2018, she had steadily been decreasing her dose of Keppra under the supervision of her previous neurologist (from 750mg  three times daily to 1000mg  twice daily to 750mg  twice daily).   She has no previous history of seizures. She has history of repeated closed head trauma. There is no known family history of seizures.  MRI of brain without contrast from 06/01/15 was personally reviewed and was normal. EEG from 07/21/15 was normal.  Past antiepileptic medication: Depakote ER 1000mg  daily (helpful but discontinued due to pregnancy)  PSYCHIATRIC HISTORY: She has an extensive psychiatric history. She carries a diagnosis of schizoaffective disorder, Bipolar disorder, schizophrenia and borderline personality disorder. She has history of prior suicide attempts. She is treated at [facility]. She is a ward of the state and was unable to keep her baby. Her husband had passed away and she is under the care of her mother-in-law. Her mother-in-law would like more extensive testing to narrow down and determine an accurate psychiatric diagnosis.  Of note, she has been noted to have elevated prolactin levels. Her GYN ordered an MRI of the brain which has not been performed. She has upcoming appointment with him to discuss further.  Past Medical History: Past Medical History:  Diagnosis Date  . Acid reflux   . Asthma    last attack 03/13/15 or 03/14/15  . Autism   . 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 Chronic constipation   . Depression   . Drug-seeking behavior   . Essential tremor   . Headache   . Overdose of acetaminophen 07/2017   and other meds  . Personality disorder ([place])     . Schizo-affective psychosis ([place])   . Schizoaffective disorder, bipolar type ([place])   . Seizures ([place])    Last seizure December 2017    Medications: Outpatient Encounter Medications as of 01/29/2019  Medication Sig  . albuterol (PROVENTIL HFA;VENTOLIN HFA) 108 (90 Base) MCG/ACT inhaler Inhale 1-2 puffs into the lungs every 6 (six) hours as needed for wheezing or shortness of breath.  . fluticasone-salmeterol (ADVAIR HFA) 115-21 MCG/ACT inhaler Inhale 2 puffs into the lungs 2 (two) times daily.  . nortriptyline (PAMELOR) 10 MG capsule Take 1 capsule (10 mg total) by mouth at bedtime. (Patient not taking: Reported on 01/19/2019)  . predniSONE (STERAPRED UNI-PAK 21 TAB) 10 MG (21) TBPK tablet As directed (Patient not taking: Reported on 07/03/2018)  . risperiDONE (RISPERDAL) 2 MG tablet Take 2 mg by mouth daily.  Marland Kitchen topiramate (TOPAMAX) 100 MG tablet TAKE 1 TABLET BY MOUTH TWICE A DAY (Patient taking differently: Take 100 mg by mouth 2 (two) times daily. )   No facility-administered encounter medications on file as of 01/29/2019.    Allergies: Allergies  Allergen Reactions  . Bee Venom Anaphylaxis  . Coconut Flavor Anaphylaxis and Rash  . Geodon [Ziprasidone Hcl] Other (See Comments)    Pt states that this medication causes paralysis of the mouth.    . Haloperidol And Related Other (See Comments)    Pt states that this medication causes paralysis of the mouth.    . Lithium Other (See Comments)    Reaction:  Seizure-like activity.    . Oxycodone Other (See Comments)    Reaction:  Hallucinations   . Seroquel [Quetiapine Fumarate] Other (See Comments)    Pt states that this medication is too strong.   Marland Kitchen  Shellfish Allergy Anaphylaxis  . Phenergan [Promethazine Hcl] Other (See Comments)    Reaction:  Chest pain    . Prilosec [Omeprazole] Nausea And Vomiting  . Sulfa Antibiotics Other (See Comments)    Chest pain   . Tegretol [Carbamazepine] Nausea And Vomiting    Family  History: Family History  Problem Relation Age of Onset  . Mental illness Father   . Asthma Father   . PDD Brother   . Seizures Brother     Social History: Social History   Socioeconomic History  . Marital status: Widowed    Spouse name: Not on file  . Number of children: 0  . Years of education: Not on file  . Highest education level: Not on file  Occupational History  . Occupation: disability  Tobacco Use  . Smoking status: Former Smoker    Packs/day: 0.00  . Smokeless tobacco: Never Used  . Tobacco comment: Smoked for 2  years age [age]  Substance and Sexual Activity  . Alcohol use: No    Alcohol/week: 1.0 standard drinks    Types: 1 Standard drinks or equivalent per week  . Drug use: No    Comment: History of cocaine use at age 26 for 4 months  . Sexual activity: Yes    Birth control/protection: None  Other Topics Concern  . Not on file  Social History Narrative   Marital status: married; guardian is Furniture conservator/restorer      Children: none      Lives: with husband in apartment      Employment:  Disability      Tobacco: quit smoking; smoked for two years.      Alcohol ;none      Drugs: none   Has not traveled outside of the country.        Social Determinants of Health   Financial Resource Strain:   . Difficulty of Paying Living Expenses: Not on file  Food Insecurity:   . Worried About Charity fundraiser in the Last Year: Not on file  . Ran Out of Food in the Last Year: Not on file  Transportation Needs:   . Lack of Transportation (Medical): Not on file  . Lack of Transportation (Non-Medical): Not on file  Physical Activity:   . Days of Exercise per Week: Not on file  . Minutes of Exercise per Session: Not on file  Stress:   . Feeling of Stress : Not on file  Social Connections:   . Frequency of Communication with Friends and Family: Not on file  . Frequency of Social Gatherings with Friends and Family: Not on file  . Attends Religious Services: Not on file   . Active Member of Clubs or Organizations: Not on file  . Attends Archivist Meetings: Not on file  . Marital Status: Not on file  Intimate Partner Violence:   . Fear of Current or Ex-Partner: Not on file  . Emotionally Abused: Not on file  . Physically Abused: Not on file  . Sexually Abused: Not on file    Observations/Objective:   Height 5\' 4"  (1.626 m), weight 182 lb (82.6 kg). No acute distress.  Alert and oriented.  Speech fluent and not dysarthric.  Language intact.  Eyes orthophoric on primary gaze.  Face symmetric.  Assessment and Plan:   1.  For past 3 months, she has had chronic migraine without aura, without status migrainosus, not intractable.  She would rather avoid antidepressants as it may interfere  with her mood disorder.  She has a fear of injections and would rather avoid CGRP inhibitors.  She has had over 15 headache days for past 3 consecutive months.  She is agreeable to trying Botox 2.  Seizure disorder.  Last seizure was December 2017.  Now off of antiepileptic therapy.  Would like to check EEG to evaluate for epileptiform discharges that may require her to remain on medication.  1.  For preventative management, Botox 2.  For abortive therapy, sumatriptan 50mg   3.  Limit use of pain relievers to no more than 2 days out of week to prevent risk of rebound or medication-overuse headache. 4.  Keep headache diary 5.  Exercise, hydration, caffeine cessation, sleep hygiene, monitor for and avoid triggers 6.  Consider:  magnesium citrate 400mg  daily, riboflavin 400mg  daily, and coenzyme Q10 100mg  three times daily 7. Routine EEG 8. Follow up for Botox-   Follow Up Instructions:    -I discussed the assessment and treatment plan with the patient. The patient was provided an opportunity to ask questions and all were answered. The patient agreed with the plan and demonstrated an understanding of the instructions.   The patient was advised to call back or seek an  in-person evaluation if the symptoms worsen or if the condition fails to improve as anticipated.   Dudley Major, DO

## 2019-01-29 ENCOUNTER — Telehealth (INDEPENDENT_AMBULATORY_CARE_PROVIDER_SITE_OTHER): Payer: PPO | Admitting: Neurology

## 2019-01-29 ENCOUNTER — Other Ambulatory Visit: Payer: Self-pay

## 2019-01-29 ENCOUNTER — Encounter: Payer: Self-pay | Admitting: Neurology

## 2019-01-29 VITALS — Ht 64.0 in | Wt 182.0 lb

## 2019-01-29 DIAGNOSIS — F259 Schizoaffective disorder, unspecified: Secondary | ICD-10-CM

## 2019-01-29 DIAGNOSIS — G43709 Chronic migraine without aura, not intractable, without status migrainosus: Secondary | ICD-10-CM

## 2019-01-29 DIAGNOSIS — G40909 Epilepsy, unspecified, not intractable, without status epilepticus: Secondary | ICD-10-CM

## 2019-01-29 DIAGNOSIS — Z9989 Dependence on other enabling machines and devices: Secondary | ICD-10-CM

## 2019-01-29 MED ORDER — SUMATRIPTAN SUCCINATE 50 MG PO TABS: ORAL_TABLET | ORAL | 3 refills | Status: DC

## 2019-02-02 ENCOUNTER — Encounter: Payer: Self-pay | Admitting: *Deleted

## 2019-02-02 ENCOUNTER — Ambulatory Visit (INDEPENDENT_AMBULATORY_CARE_PROVIDER_SITE_OTHER): Payer: PPO | Admitting: Neurology

## 2019-02-02 ENCOUNTER — Other Ambulatory Visit: Payer: Self-pay

## 2019-02-02 DIAGNOSIS — G40909 Epilepsy, unspecified, not intractable, without status epilepticus: Secondary | ICD-10-CM

## 2019-02-02 NOTE — Progress Notes (Addendum)
Initiated PA  Code [id number] from [employer] for response  [employer] was of no help so I submitted the request directly to [employer] and to Medicaid now waiting for response

## 2019-02-09 NOTE — Procedures (Signed)
ELECTROENCEPHALOGRAM REPORT  Date of Study: 02/02/2019  Patient's Name: [name] MRN: [id number] Date of Birth: 1990-05-22  Referring Provider: Metta Clines, DO  Clinical History: 29 year old female presents for evaluation of seizures, unwitnessed.  Medications: PADVAIR HFA 115-21 MCG/ACT inhaler PROVENTIL HFA;VENTOLIN HFA 108 (90 Base) MCG/ACT inhaler PAMELOR 10 MG capsule STERAPRED UNI-PAK 21 TAB) 10 MG (21) TBPK tablet RISPERDAL 2 MG tablet TOPAMAX 100 MG tablet  Technical Summary: A multichannel digital EEG recording measured by the international 10-20 system with electrodes applied with paste and impedances below 5000 ohms performed in our laboratory with EKG monitoring in an awake and drowsy patient.  Hyperventilation not performed due to wearing mask for Covid.  Photic stimulation was performed.  The digital EEG was referentially recorded, reformatted, and digitally filtered in a variety of bipolar and referential montages for optimal display.    Description: The patient is awake and drowsy during the recording.  During maximal wakefulness, there is a symmetric, medium voltage 10 Hz posterior dominant rhythm that attenuates with eye opening.  The record is symmetric.  Stage 2 sleep not seen.  Photic stimulation did not elicit any abnormalities.  There were no epileptiform discharges or electrographic seizures seen.    EKG lead was unremarkable.  Impression: This awake and drowsy EEG is normal.    Clinical Correlation: A normal EEG does not exclude a clinical diagnosis of epilepsy.  If further clinical questions remain, prolonged EEG may be helpful.  Clinical correlation is advised.   Metta Clines, DO

## 2019-02-10 DIAGNOSIS — G4733 Obstructive sleep apnea (adult) (pediatric): Secondary | ICD-10-CM | POA: Diagnosis not present

## 2019-02-12 NOTE — Progress Notes (Unsigned)
Authorization code [place] [id number].

## 2019-02-13 ENCOUNTER — Other Ambulatory Visit: Payer: Self-pay

## 2019-02-13 ENCOUNTER — Ambulatory Visit (INDEPENDENT_AMBULATORY_CARE_PROVIDER_SITE_OTHER): Payer: PPO | Admitting: Neurology

## 2019-02-13 DIAGNOSIS — G43709 Chronic migraine without aura, not intractable, without status migrainosus: Secondary | ICD-10-CM

## 2019-02-13 MED ORDER — ONABOTULINUMTOXINA 100 UNITS IJ SOLR
200.0000 [IU] | Freq: Once | INTRAMUSCULAR | Status: AC
  Administered 2019-02-13: 200 [IU] via INTRAMUSCULAR

## 2019-02-13 NOTE — Progress Notes (Signed)
[facility]   Procedure Note Botox  Attending: Dr. Metta Clines  Preoperative Diagnosis(es): Chronic migraine  Consent obtained from: The patient Benefits discussed included, but were not limited to decreased muscle tightness, increased joint range of motion, and decreased pain.  Risk discussed included, but were not limited pain and discomfort, bleeding, bruising, excessive weakness, venous thrombosis, muscle atrophy and dysphagia.  Anticipated outcomes of the procedure as well as he risks and benefits of the alternatives to the procedure, and the roles and tasks of the personnel to be involved, were discussed with the patient, and the patient consents to the procedure and agrees to proceed. A copy of the patient medication guide was given to the patient which explains the blackbox warning.  Patients identity and treatment sites confirmed Yes.  .  Details of Procedure: Skin was cleaned with alcohol. Prior to injection, the needle plunger was aspirated to make sure the needle was not within a blood vessel.  There was no blood retrieved on aspiration.    Following is a summary of the muscles injected  And the amount of Botulinum toxin used:  Dilution 200 units of Botox was reconstituted with 4 ml of preservative free normal saline. Time of reconstitution: At the time of the office visit (<30 minutes prior to injection)   Injections  155 total units of Botox was injected with a 30 gauge needle.  Injection Sites: L occipitalis: 15 units- 3 sites  R occiptalis: 15 units- 3 sites  L upper trapezius: 15 units- 3 sites R upper trapezius: 15 units- 3 sits          L paraspinal: 10 units- 2 sites R paraspinal: 10 units- 2 sites  Face L frontalis(2 injection sites):10 units   R frontalis(2 injection sites):10 units         L corrugator: 5 units   R corrugator: 5 units           Procerus: 5 units   L temporalis: 20 units R temporalis: 20 units   Agent:  200 units of botulinum Type  A (Onobotulinum Toxin type A) was reconstituted with 4 ml of preservative free normal saline.  Time of reconstitution: At the time of the office visit (<30 minutes prior to injection)     Total injected (Units): 155  Total wasted (Units): 45  Patient tolerated procedure well without complications.   Reinjection is anticipated in 3 months. Follow up office visit:  March

## 2019-02-16 ENCOUNTER — Telehealth: Payer: Self-pay | Admitting: Neurology

## 2019-02-16 NOTE — Telephone Encounter (Signed)
Can you advise on this, I am not sure

## 2019-02-16 NOTE — Telephone Encounter (Signed)
Patient advised of the botox, per Dr. Starr Lake notes

## 2019-02-16 NOTE — Telephone Encounter (Signed)
Patient left msg with after hours about having the botox shot and wanting to know how long it took to take effect and work for her. Thanks!

## 2019-02-16 NOTE — Telephone Encounter (Signed)
It may take several weeks.  50% of people have a 50% reduction in headache frequency after 6 months (after 2 full rounds).  However, a lot of people have significant improvement after the first shot.  However, it is not immediate.  At the very least, it may take 4 weeks before she begins noticing any improvement.

## 2019-02-18 DIAGNOSIS — F603 Borderline personality disorder: Secondary | ICD-10-CM | POA: Diagnosis not present

## 2019-02-23 ENCOUNTER — Telehealth: Payer: Self-pay | Admitting: Neurology

## 2019-02-23 NOTE — Telephone Encounter (Signed)
Patient called with questions about a letter she received from her insurance company about her Botox.

## 2019-02-24 ENCOUNTER — Other Ambulatory Visit: Payer: Self-pay

## 2019-02-24 DIAGNOSIS — X781XXA Intentional self-harm by knife, initial encounter: Secondary | ICD-10-CM | POA: Diagnosis not present

## 2019-02-24 DIAGNOSIS — J45909 Unspecified asthma, uncomplicated: Secondary | ICD-10-CM | POA: Insufficient documentation

## 2019-02-24 DIAGNOSIS — S51812A Laceration without foreign body of left forearm, initial encounter: Secondary | ICD-10-CM | POA: Insufficient documentation

## 2019-02-24 DIAGNOSIS — Z79899 Other long term (current) drug therapy: Secondary | ICD-10-CM | POA: Diagnosis not present

## 2019-02-24 DIAGNOSIS — Y9389 Activity, other specified: Secondary | ICD-10-CM | POA: Insufficient documentation

## 2019-02-24 DIAGNOSIS — S50812A Abrasion of left forearm, initial encounter: Secondary | ICD-10-CM | POA: Diagnosis not present

## 2019-02-24 DIAGNOSIS — Z87891 Personal history of nicotine dependence: Secondary | ICD-10-CM | POA: Diagnosis not present

## 2019-02-24 DIAGNOSIS — Z733 Stress, not elsewhere classified: Secondary | ICD-10-CM | POA: Diagnosis not present

## 2019-02-24 DIAGNOSIS — T148XXA Other injury of unspecified body region, initial encounter: Secondary | ICD-10-CM | POA: Insufficient documentation

## 2019-02-24 DIAGNOSIS — J4540 Moderate persistent asthma, uncomplicated: Secondary | ICD-10-CM | POA: Diagnosis not present

## 2019-02-24 DIAGNOSIS — R69 Illness, unspecified: Secondary | ICD-10-CM | POA: Diagnosis not present

## 2019-02-24 DIAGNOSIS — F329 Major depressive disorder, single episode, unspecified: Secondary | ICD-10-CM | POA: Diagnosis present

## 2019-02-24 DIAGNOSIS — R5381 Other malaise: Secondary | ICD-10-CM | POA: Diagnosis not present

## 2019-02-24 DIAGNOSIS — R4689 Other symptoms and signs involving appearance and behavior: Secondary | ICD-10-CM | POA: Diagnosis not present

## 2019-02-24 DIAGNOSIS — Y9289 Other specified places as the place of occurrence of the external cause: Secondary | ICD-10-CM | POA: Diagnosis not present

## 2019-02-24 DIAGNOSIS — Y999 Unspecified external cause status: Secondary | ICD-10-CM | POA: Diagnosis not present

## 2019-02-25 ENCOUNTER — Emergency Department (HOSPITAL_COMMUNITY)
Admission: EM | Admit: 2019-02-25 | Discharge: 2019-02-25 | Disposition: A | Discharge status: Home / Self Care | Payer: PPO | Attending: Emergency Medicine | Admitting: Emergency Medicine

## 2019-02-25 ENCOUNTER — Other Ambulatory Visit: Payer: Self-pay

## 2019-02-25 DIAGNOSIS — T07XXXA Unspecified multiple injuries, initial encounter: Secondary | ICD-10-CM

## 2019-02-25 DIAGNOSIS — G4733 Obstructive sleep apnea (adult) (pediatric): Secondary | ICD-10-CM | POA: Diagnosis not present

## 2019-02-25 DIAGNOSIS — F69 Unspecified disorder of adult personality and behavior: Secondary | ICD-10-CM

## 2019-02-25 NOTE — Progress Notes (Signed)
Received [name] from triage with EMS, she is tearful, talking to herself and extremely upset. She eventually calmed herself down, but refused all treatment and refused to cooperate with the admission process. Later she received her discharge paperwork and her questions were answered. She was escorted to the front door to her ride home.

## 2019-02-25 NOTE — Discharge Instructions (Signed)
Follow up with your therapist and/or psychiatrist.  If you experience worsening depression, suicidal thoughts-promptly return to the emergency department for repeat evaluation.  Continue your daily prescribed medications.  You may also return for any other new or concerning symptoms.
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
## 2019-02-26 DIAGNOSIS — R0602 Shortness of breath: Secondary | ICD-10-CM | POA: Diagnosis not present

## 2019-02-27 DIAGNOSIS — G4733 Obstructive sleep apnea (adult) (pediatric): Secondary | ICD-10-CM | POA: Diagnosis not present

## 2019-03-04 ENCOUNTER — Emergency Department (HOSPITAL_COMMUNITY)
Admission: EM | Admit: 2019-03-04 | Discharge: 2019-03-05 | Disposition: A | Discharge status: Other Acute Inpatient Hospital | Payer: PPO | Attending: Emergency Medicine | Admitting: Emergency Medicine

## 2019-03-04 ENCOUNTER — Encounter (HOSPITAL_COMMUNITY): Payer: Self-pay | Admitting: Emergency Medicine

## 2019-03-04 ENCOUNTER — Other Ambulatory Visit: Payer: Self-pay

## 2019-03-04 DIAGNOSIS — F29 Unspecified psychosis not due to a substance or known physiological condition: Secondary | ICD-10-CM | POA: Diagnosis not present

## 2019-03-04 DIAGNOSIS — F329 Major depressive disorder, single episode, unspecified: Secondary | ICD-10-CM | POA: Diagnosis not present

## 2019-03-04 DIAGNOSIS — R112 Nausea with vomiting, unspecified: Secondary | ICD-10-CM | POA: Diagnosis not present

## 2019-03-04 DIAGNOSIS — J45909 Unspecified asthma, uncomplicated: Secondary | ICD-10-CM | POA: Insufficient documentation

## 2019-03-04 DIAGNOSIS — R454 Irritability and anger: Secondary | ICD-10-CM | POA: Diagnosis not present

## 2019-03-04 DIAGNOSIS — Z046 Encounter for general psychiatric examination, requested by authority: Secondary | ICD-10-CM | POA: Insufficient documentation

## 2019-03-04 DIAGNOSIS — R45851 Suicidal ideations: Secondary | ICD-10-CM | POA: Diagnosis not present

## 2019-03-04 DIAGNOSIS — Z7289 Other problems related to lifestyle: Secondary | ICD-10-CM | POA: Diagnosis not present

## 2019-03-04 DIAGNOSIS — F250 Schizoaffective disorder, bipolar type: Secondary | ICD-10-CM | POA: Insufficient documentation

## 2019-03-04 DIAGNOSIS — Z20822 Contact with and (suspected) exposure to covid-19: Secondary | ICD-10-CM | POA: Diagnosis not present

## 2019-03-04 DIAGNOSIS — R630 Anorexia: Secondary | ICD-10-CM | POA: Diagnosis not present

## 2019-03-04 DIAGNOSIS — Z79899 Other long term (current) drug therapy: Secondary | ICD-10-CM | POA: Diagnosis not present

## 2019-03-04 DIAGNOSIS — Z87891 Personal history of nicotine dependence: Secondary | ICD-10-CM | POA: Diagnosis not present

## 2019-03-04 DIAGNOSIS — R410 Disorientation, unspecified: Secondary | ICD-10-CM | POA: Diagnosis not present

## 2019-03-04 DIAGNOSIS — R531 Weakness: Secondary | ICD-10-CM | POA: Insufficient documentation

## 2019-03-04 DIAGNOSIS — F840 Autistic disorder: Secondary | ICD-10-CM | POA: Diagnosis not present

## 2019-03-04 DIAGNOSIS — F32A Depression, unspecified: Secondary | ICD-10-CM

## 2019-03-04 NOTE — ED Triage Notes (Addendum)
Patient arrived with EMS from home , patient reported that she has not eaten for 1 week due to financial constraints , denies suicidal ideation / no hallucinations . Poor historian / unable to focus during encounter with triage RN .

## 2019-03-05 ENCOUNTER — Inpatient Hospital Stay (HOSPITAL_COMMUNITY)
Admission: AD | Admit: 2019-03-05 | Discharge: 2019-03-07 | DRG: 885 | Disposition: A | Discharge status: Home / Self Care | Payer: PPO | Source: Intra-hospital | Attending: Psychiatry | Admitting: Psychiatry

## 2019-03-05 ENCOUNTER — Encounter (HOSPITAL_COMMUNITY): Payer: Self-pay | Admitting: Psychiatry

## 2019-03-05 ENCOUNTER — Other Ambulatory Visit: Payer: Self-pay

## 2019-03-05 DIAGNOSIS — Z882 Allergy status to sulfonamides status: Secondary | ICD-10-CM | POA: Diagnosis not present

## 2019-03-05 DIAGNOSIS — F840 Autistic disorder: Secondary | ICD-10-CM | POA: Diagnosis present

## 2019-03-05 DIAGNOSIS — Z87891 Personal history of nicotine dependence: Secondary | ICD-10-CM

## 2019-03-05 DIAGNOSIS — Z888 Allergy status to other drugs, medicaments and biological substances status: Secondary | ICD-10-CM | POA: Diagnosis not present

## 2019-03-05 DIAGNOSIS — Z148 Genetic carrier of other disease: Secondary | ICD-10-CM

## 2019-03-05 DIAGNOSIS — G250 Essential tremor: Secondary | ICD-10-CM | POA: Diagnosis present

## 2019-03-05 DIAGNOSIS — F603 Borderline personality disorder: Secondary | ICD-10-CM | POA: Diagnosis present

## 2019-03-05 DIAGNOSIS — R45851 Suicidal ideations: Secondary | ICD-10-CM | POA: Diagnosis present

## 2019-03-05 DIAGNOSIS — Z91030 Bee allergy status: Secondary | ICD-10-CM

## 2019-03-05 DIAGNOSIS — G4730 Sleep apnea, unspecified: Secondary | ICD-10-CM | POA: Diagnosis not present

## 2019-03-05 DIAGNOSIS — J45909 Unspecified asthma, uncomplicated: Secondary | ICD-10-CM | POA: Diagnosis not present

## 2019-03-05 DIAGNOSIS — K219 Gastro-esophageal reflux disease without esophagitis: Secondary | ICD-10-CM | POA: Diagnosis present

## 2019-03-05 DIAGNOSIS — F3110 Bipolar disorder, current episode manic without psychotic features, unspecified: Secondary | ICD-10-CM

## 2019-03-05 DIAGNOSIS — F250 Schizoaffective disorder, bipolar type: Secondary | ICD-10-CM | POA: Diagnosis present

## 2019-03-05 DIAGNOSIS — Z825 Family history of asthma and other chronic lower respiratory diseases: Secondary | ICD-10-CM

## 2019-03-05 DIAGNOSIS — Z885 Allergy status to narcotic agent status: Secondary | ICD-10-CM

## 2019-03-05 DIAGNOSIS — Z818 Family history of other mental and behavioral disorders: Secondary | ICD-10-CM

## 2019-03-05 DIAGNOSIS — K5909 Other constipation: Secondary | ICD-10-CM | POA: Diagnosis not present

## 2019-03-05 DIAGNOSIS — F4310 Post-traumatic stress disorder, unspecified: Secondary | ICD-10-CM | POA: Diagnosis present

## 2019-03-05 DIAGNOSIS — Z91013 Allergy to seafood: Secondary | ICD-10-CM

## 2019-03-05 DIAGNOSIS — Z7951 Long term (current) use of inhaled steroids: Secondary | ICD-10-CM | POA: Diagnosis not present

## 2019-03-05 DIAGNOSIS — Z91018 Allergy to other foods: Secondary | ICD-10-CM | POA: Diagnosis not present

## 2019-03-05 DIAGNOSIS — F329 Major depressive disorder, single episode, unspecified: Secondary | ICD-10-CM | POA: Diagnosis not present

## 2019-03-05 DIAGNOSIS — R569 Unspecified convulsions: Secondary | ICD-10-CM | POA: Diagnosis not present

## 2019-03-05 DIAGNOSIS — G4700 Insomnia, unspecified: Secondary | ICD-10-CM | POA: Diagnosis present

## 2019-03-05 DIAGNOSIS — Z915 Personal history of self-harm: Secondary | ICD-10-CM | POA: Diagnosis not present

## 2019-03-05 HISTORY — DX: Anxiety disorder, unspecified: F41.9

## 2019-03-05 LAB — RAPID URINE DRUG SCREEN, HOSP PERFORMED
Amphetamines: NOT DETECTED
Barbiturates: NOT DETECTED
Benzodiazepines: NOT DETECTED
Cocaine: NOT DETECTED
Opiates: NOT DETECTED
Tetrahydrocannabinol: NOT DETECTED

## 2019-03-05 LAB — CBC
HCT: 51.7 % — ABNORMAL HIGH (ref 36.0–46.0)
Hemoglobin: 15.9 g/dL — ABNORMAL HIGH (ref 12.0–15.0)
MCH: 26.7 pg (ref 26.0–34.0)
MCHC: 30.8 g/dL (ref 30.0–36.0)
MCV: 86.9 fL (ref 80.0–100.0)
Platelets: 193 10*3/uL (ref 150–400)
RBC: 5.95 MIL/uL — ABNORMAL HIGH (ref 3.87–5.11)
RDW: 12.9 % (ref 11.5–15.5)
WBC: 8.8 10*3/uL (ref 4.0–10.5)
nRBC: 0 % (ref 0.0–0.2)

## 2019-03-05 LAB — I-STAT BETA HCG BLOOD, ED (MC, WL, AP ONLY): I-stat hCG, quantitative: <5 m[IU]/mL (ref <5)

## 2019-03-05 LAB — I-STAT CHEM 8, ED
BUN: 4 mg/dL — ABNORMAL LOW (ref 6–20)
Calcium, Ion: 1.14 mmol/L — ABNORMAL LOW (ref 1.15–1.40)
Chloride: 105 mmol/L (ref 98–111)
Creatinine, Ser: 0.6 mg/dL (ref 0.44–1.00)
Glucose, Bld: 91 mg/dL (ref 70–99)
HCT: 44 % (ref 36.0–46.0)
Hemoglobin: 15 g/dL (ref 12.0–15.0)
Potassium: 3.9 mmol/L (ref 3.5–5.1)
Sodium: 139 mmol/L (ref 135–145)
TCO2: 22 mmol/L (ref 22–32)

## 2019-03-05 LAB — RESPIRATORY PANEL BY RT PCR (FLU A&B, COVID)
Influenza A by PCR: NEGATIVE
Influenza B by PCR: NEGATIVE
SARS Coronavirus 2 by RT PCR: NEGATIVE

## 2019-03-05 LAB — CBG MONITORING, ED: Glucose-Capillary: 84 mg/dL (ref 70–99)

## 2019-03-05 LAB — ETHANOL: Alcohol, Ethyl (B): <10 mg/dL (ref <10)

## 2019-03-05 MED ORDER — SUMATRIPTAN SUCCINATE 50 MG PO TABS
50.0000 mg | ORAL_TABLET | Freq: Every day | ORAL | Status: DC | PRN
  Filled 2019-03-05: qty 1

## 2019-03-05 MED ORDER — RISPERIDONE 2 MG PO TABS
2.0000 mg | ORAL_TABLET | Freq: Two times a day (BID) | ORAL | Status: DC
  Filled 2019-03-05 (×2): qty 1

## 2019-03-05 MED ORDER — HYDROXYZINE HCL 25 MG PO TABS
25.0000 mg | ORAL_TABLET | ORAL | Status: DC | PRN
  Administered 2019-03-05 – 2019-03-06 (×2): 25 mg via ORAL
  Filled 2019-03-05 (×2): qty 1

## 2019-03-05 MED ORDER — ONDANSETRON HCL 4 MG PO TABS
4.0000 mg | ORAL_TABLET | Freq: Three times a day (TID) | ORAL | Status: DC | PRN
  Administered 2019-03-05 – 2019-03-06 (×2): 4 mg via ORAL
  Filled 2019-03-05 (×2): qty 1

## 2019-03-05 MED ORDER — TOPIRAMATE 100 MG PO TABS: 100.0000 mg | ORAL_TABLET | Freq: Every day | ORAL | Status: DC

## 2019-03-05 MED ORDER — ONDANSETRON 4 MG PO TBDP
8.0000 mg | ORAL_TABLET | Freq: Once | ORAL | Status: AC
  Administered 2019-03-05: 8 mg via ORAL
  Filled 2019-03-05: qty 2

## 2019-03-05 MED ORDER — ALBUTEROL SULFATE HFA 108 (90 BASE) MCG/ACT IN AERS: 1.0000 | INHALATION_SPRAY | Freq: Four times a day (QID) | RESPIRATORY_TRACT | Status: DC | PRN

## 2019-03-05 MED ORDER — MOMETASONE FURO-FORMOTEROL FUM 200-5 MCG/ACT IN AERO
2.0000 | INHALATION_SPRAY | Freq: Two times a day (BID) | RESPIRATORY_TRACT | Status: DC
  Administered 2019-03-05 – 2019-03-07 (×4): 2 via RESPIRATORY_TRACT
  Filled 2019-03-05: qty 8.8

## 2019-03-05 MED ORDER — RISPERIDONE 2 MG PO TABS
2.0000 mg | ORAL_TABLET | Freq: Two times a day (BID) | ORAL | Status: DC
  Administered 2019-03-05 – 2019-03-07 (×4): 2 mg via ORAL
  Filled 2019-03-05 (×7): qty 1

## 2019-03-05 MED ORDER — SUMATRIPTAN SUCCINATE 50 MG PO TABS
50.0000 mg | ORAL_TABLET | Freq: Every day | ORAL | Status: AC | PRN
  Administered 2019-03-05: 50 mg via ORAL
  Filled 2019-03-05: qty 1

## 2019-03-05 MED ORDER — ALBUTEROL SULFATE HFA 108 (90 BASE) MCG/ACT IN AERS
1.0000 | INHALATION_SPRAY | Freq: Four times a day (QID) | RESPIRATORY_TRACT | Status: DC | PRN
  Administered 2019-03-05 – 2019-03-07 (×2): 2 via RESPIRATORY_TRACT
  Filled 2019-03-05: qty 6.7

## 2019-03-05 MED ORDER — TRAZODONE HCL 50 MG PO TABS
50.0000 mg | ORAL_TABLET | Freq: Every evening | ORAL | Status: DC | PRN
  Filled 2019-03-05: qty 1

## 2019-03-05 MED ORDER — SERTRALINE HCL 25 MG PO TABS
25.0000 mg | ORAL_TABLET | Freq: Every day | ORAL | Status: DC
  Administered 2019-03-05 – 2019-03-07 (×3): 25 mg via ORAL
  Filled 2019-03-05 (×6): qty 1

## 2019-03-05 MED ORDER — ACETAMINOPHEN 325 MG PO TABS: 650.0000 mg | ORAL_TABLET | Freq: Four times a day (QID) | ORAL | Status: DC | PRN

## 2019-03-05 MED ORDER — MOMETASONE FURO-FORMOTEROL FUM 200-5 MCG/ACT IN AERO
2.0000 | INHALATION_SPRAY | Freq: Two times a day (BID) | RESPIRATORY_TRACT | Status: DC
  Filled 2019-03-05: qty 8.8

## 2019-03-05 MED ORDER — CHLORPROMAZINE HCL 25 MG PO TABS
25.0000 mg | ORAL_TABLET | Freq: Once | ORAL | Status: AC
  Administered 2019-03-05: 25 mg via ORAL
  Filled 2019-03-05: qty 1

## 2019-03-05 MED ORDER — HYDROXYZINE HCL 50 MG PO TABS
50.0000 mg | ORAL_TABLET | Freq: Every evening | ORAL | Status: DC | PRN
  Filled 2019-03-05 (×2): qty 1
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
## 2019-03-05 NOTE — ED Notes (Signed)
zofran given per [facility]. Name/DOB verified with pt
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
## 2019-03-05 NOTE — ED Provider Notes (Signed)
[facility] EMERGENCY DEPARTMENT Provider Note   CSN: [id number] Arrival date & time: 03/04/19  2332     History Chief Complaint  Patient presents with  . No food In the house    [name] is a 29 y.o. female.  29 y/o female with hx of depression, schizo-affective psychosis, personality d/o, migraines, seizure d/o (pseudoseizure?) presents to the ED c/o generalized weakness. Patient states that she hasn't eaten any food in the past week. Reports not having an appetite and not liking the food she had in her house. Spit up some yesterday, but no gross vomiting. C/o mild nausea at present. Alleges feeling disoriented due to her weakness. Denies SI/HI, AVH. States that she did follow up with [facility] this week as she was supposed to.  Hx of negative EEG in 01/2019 and in 2017. Is no longer on Keppra.   The history is provided by the patient. No language interpreter was used.       Past Medical History:  Diagnosis Date  . Acid reflux   . Asthma    last attack 03/13/15 or 03/14/15  . Autism   . 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 Chronic constipation   . Depression   . Drug-seeking behavior   . Essential tremor   . Headache   . Overdose of acetaminophen 07/2017   and other meds  . Personality disorder ([place])   . Schizo-affective psychosis ([place])   . Schizoaffective disorder, bipolar type ([place])   . Seizures [facility])    Last seizure December 2017  . Sleep apnea     Patient Active Problem List   Diagnosis Date Noted  . Desire for pregnancy 07/03/2018  . Drug overdose 07/22/2017  . Overdose 07/22/2017  . Intentional acetaminophen overdose ([place])   . Metabolic acidosis   . DUB (dysfunctional uterine bleeding) 11/22/2016  . Hyperprolactinemia ([place]) 08/20/2016  . Tachycardia 01/13/2016  . 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  . Seizure disorder ([place]) 08/08/2015  . Chronic migraine 07/27/2015  . Asthma 04/15/2015  . Schizoaffective disorder,  bipolar type ([place]) 03/10/2014  . Borderline personality disorder ([place]) 03/10/2014  . PTSD (post-traumatic stress disorder) 03/10/2014  . Autism spectrum disorder 06/15/2013    Past Surgical History:  Procedure Laterality Date  . MOUTH SURGERY  2009 or 2010     OB History    Gravida  2   Para  1   Term  1   Preterm      AB  1   Living  1     SAB  1   TAB      Ectopic      Multiple  0   Live Births  1           Family History  Problem Relation Age of Onset  . Mental illness Father   . Asthma Father   . PDD Brother   . Seizures Brother     Social History   Tobacco Use  . Smoking status: Former Smoker    Packs/day: 0.00  . Smokeless tobacco: Never Used  . Tobacco comment: Smoked for 2  years age [age]  Substance Use Topics  . Alcohol use: No    Alcohol/week: 1.0 standard drinks    Types: 1 Standard drinks or equivalent per week  . Drug use: No    Comment: History of cocaine use at age 34 for 4 months    Home Medications Prior to Admission medications  Medication Sig Start Date End Date Taking? Authorizing Provider  albuterol (PROVENTIL HFA;VENTOLIN HFA) 108 (90 Base) MCG/ACT inhaler Inhale 1-2 puffs into the lungs every 6 (six) hours as needed for wheezing or shortness of breath.    [provider]  fluticasone-salmeterol (ADVAIR HFA) 115-21 MCG/ACT inhaler Inhale 2 puffs into the lungs 2 (two) times daily. 08/03/15   Timmothy Euler, MD  nortriptyline (PAMELOR) 10 MG capsule Take 1 capsule (10 mg total) by mouth at bedtime. Patient not taking: Reported on 01/19/2019 01/05/19   Pieter Partridge, DO  predniSONE (STERAPRED UNI-PAK 21 TAB) 10 MG (21) TBPK tablet As directed Patient not taking: Reported on 07/03/2018 06/24/18   Pieter Partridge, DO  risperiDONE (RISPERDAL) 2 MG tablet Take 2 mg by mouth daily.    [provider]  SUMAtriptan (IMITREX) 50 MG tablet Take 1 tablet earliest onset of migraine.  May repeat in 2 hours if headache  persists or recurs.  Maximum 2 tablets in 24 hours. 01/29/19   Pieter Partridge, DO  topiramate (TOPAMAX) 100 MG tablet TAKE 1 TABLET BY MOUTH TWICE A DAY Patient not taking: No sig reported 10/03/18   Pieter Partridge, DO    Allergies    Bee venom, Coconut flavor, Geodon [ziprasidone hcl], Haloperidol and related, Lithium, Oxycodone, Seroquel [quetiapine fumarate], Shellfish allergy, Phenergan [promethazine hcl], Prilosec [omeprazole], Sulfa antibiotics, and Tegretol [carbamazepine]  Review of Systems   Review of Systems  Ten systems reviewed and are negative for acute change, except as noted in the HPI.    Physical Exam Updated Vital Signs BP (!) 142/102   Pulse (!) 112   Temp 98.8 F (37.1 C) (Oral)   Resp 18   SpO2 100%   Physical Exam Vitals and nursing note reviewed.  Constitutional:      General: She is not in acute distress.    Appearance: She is well-developed. She is not diaphoretic.     Comments: Slightly pale appearing. Nontoxic. Moving all extremities spontaneously.  HENT:     Head: Normocephalic and atraumatic.  Eyes:     General: No scleral icterus.    Conjunctiva/sclera: Conjunctivae normal.  Pulmonary:     Effort: Pulmonary effort is normal. No respiratory distress.     Comments: Respirations even and unlabored Musculoskeletal:        General: Normal range of motion.     Cervical back: Normal range of motion.  Skin:    General: Skin is warm and dry.     Coloration: Skin is not pale.     Findings: No erythema or rash.  Neurological:     Mental Status: She is alert and oriented to person, place, and time.     Comments: GCS 15. Speech is clear, goal oriented. Patient answers questions appropriately and follows commands.  Psychiatric:        Attention and Perception: She is inattentive.        Mood and Affect: Mood is depressed.        Behavior: Behavior normal.     Comments: Voice is quiet, mumbled. Poor eye contact.     ED Results / Procedures /  Treatments   Labs (all labs ordered are listed, but only abnormal results are displayed) Labs Reviewed  CBC - Abnormal; Notable for the following components:      Result Value   RBC 5.95 (*)    Hemoglobin 15.9 (*)    HCT 51.7 (*)    All other components within normal  limits  I-STAT CHEM 8, ED - Abnormal; Notable for the following components:   BUN 4 (*)    Calcium, Ion 1.14 (*)    All other components within normal limits  RESPIRATORY PANEL BY RT PCR (FLU A&B, COVID)  ETHANOL  RAPID URINE DRUG SCREEN, HOSP PERFORMED  I-STAT BETA HCG BLOOD, ED ([facility], [facility], [facility] ONLY)  CBG MONITORING, ED    EKG None  Radiology No results found.  Procedures Procedures (including critical care time)  Medications Ordered in ED Medications  ondansetron (ZOFRAN-ODT) disintegrating tablet 8 mg (8 mg Oral Given 03/05/19 0215)  chlorproMAZINE (THORAZINE) tablet 25 mg (25 mg Oral Given 03/05/19 [id number])    ED Course  I have reviewed the triage vital signs and the nursing notes.  Pertinent labs & imaging results that were available during my care of the patient were reviewed by me and considered in my medical decision making (see chart for details).  Clinical Course as of Mar 04 612  [place]  0309 Patient now endorsing depression and suicidal ideations. States she stopped eating to kill herself. Complains of difficulty controlling her anger. She is not on medications currently, but feels she needs to be back on Zoloft, Risperdal, and Thorazine. States Thorazine has helped with her anger in the past; notes it may help how she is feeling now. Will give 1 dose of this medication and initiate TTS consultation. Patient agreeable.   [KH]    Clinical Course User Index [KH] Beverely Pace   MDM Rules/Calculators/A&P                      29 year old female presents to the emergency department initially complaining of lack of appetite.  States she has not eaten in 1 week.  Encounter quickly pivoted  to complaints of worsening depression and thoughts of suicide.  She does have a history of suicide attempt and self injury.  Is not currently taking any psychiatric medications.  Patient's labs reviewed and she has been medically cleared.  Assessed by TTS who state the patient meets inpatient criteria, but no beds available.  TTS to coordinate appropriate placement.  Disposition to be determined by oncoming ED provider.   Final Clinical Impression(s) / ED Diagnoses Final diagnoses:  Depression, unspecified depression type  Anorexia    Rx / DC Orders ED Discharge Orders    None       Antonietta Breach, PA-C 03/05/19 0617    Ripley Fraise, MD 03/05/19 [phone]

## 2019-03-05 NOTE — ED Notes (Signed)
Personal valuables received from security.

## 2019-03-05 NOTE — ED Notes (Signed)
Care endorsed to [place], RN

## 2019-03-05 NOTE — ED Notes (Signed)
Breakfast ordered 

## 2019-03-05 NOTE — ED Notes (Signed)
TTS in process 

## 2019-03-05 NOTE — ED Notes (Signed)
Lunch Tray Ordered @ 1026. 

## 2019-03-05 NOTE — Progress Notes (Signed)
   03/05/19 2211  Psych Admission Type (Psych Patients Only)  Admission Status Voluntary  Psychosocial Assessment  Patient Complaints Anxiety;Agitation;Other (Comment) (sleepy)  Eye Contact Brief  Facial Expression Anxious  Affect Anxious  Speech Pressured;Rapid  Interaction Assertive  Motor Activity Fidgety;Hyperactive  Appearance/Hygiene Disheveled;In scrubs  Behavior Characteristics Cooperative;Anxious  Mood Anxious  Thought Process  Coherency Disorganized;Flight of ideas  Content Blaming others  Delusions None reported or observed  Perception WDL  Hallucination None reported or observed  Judgment Impaired  Confusion None  Danger to Self  Current suicidal ideation? Denies  Agreement Not to Harm Self Yes  Description of Agreement verbal contract for safety  Danger to Others  Danger to Others None reported or observed   Pt denies SI, HI, AVH and pain. Pt says she just wants to sleep because she was in the ER all last night. Pt speech pressured, rapid and has flight of ideas. Pt constantly moving and fidgety. Takes meds without problem. Contracts for safety.

## 2019-03-05 NOTE — ED Provider Notes (Signed)
TTS consultation is appreciated.  Patient meets inpatient criteria, but no beds available.  TTS is currently working on appropriate placement.   Delora Fuel, MD [phone]

## 2019-03-05 NOTE — ED Notes (Signed)
Pt scratching at L arm, causing abrasion. Pt asked to stop. Pt replies "I don't have to do shit." Pt laughing and continues to scratch at arm. Pt demonstrating flight of ideas. PA notified and at bedside

## 2019-03-05 NOTE — ED Notes (Signed)
Attempted to call [facility] for TTS evaluation with no answer

## 2019-03-05 NOTE — ED Notes (Signed)
TTS completed. Pt ambulatory to and from BR with steady gait. Urine collected, labeled with 2 pt identifiers, and sent to lab

## 2019-03-05 NOTE — Tx Team (Signed)
Initial Treatment Plan 03/05/2019 8:05 PM Latreese Dotterer [id number]    PATIENT STRESSORS: Educational concerns Financial difficulties Legal issue Marital or family conflict Medication change or noncompliance Occupational concerns PATIENT STRENGTHS: Ability for insight Average or above average intelligence Capable of independent living Communication skills General fund of knowledge Motivation for treatment/growth   PATIENT IDENTIFIED PROBLEMS: "suicidal thoughts"  "depression"  "anxiety"                 DISCHARGE CRITERIA:  Ability to meet basic life and health needs Adequate post-discharge living arrangements Improved stabilization in mood, thinking, and/or behavior Medical problems require only outpatient monitoring Motivation to continue treatment in a less acute level of care Need for constant or close observation no longer present Reduction of life-threatening or endangering symptoms to within safe limits Safe-care adequate arrangements made Verbal commitment to aftercare and medication compliance  PRELIMINARY DISCHARGE PLAN: Attend aftercare/continuing care group Attend PHP/IOP Outpatient therapy Return to previous living arrangement Return to previous work or school arrangements  PATIENT/FAMILY INVOLVEMENT: This treatment plan has been presented to and reviewed with the patient, Atalee Calip.  The patient and family have been given the opportunity to ask questions and make suggestions.  Grayland Ormond [place], [place] 03/05/2019, 8:05 PM

## 2019-03-05 NOTE — ED Notes (Addendum)
Pt seen flailing arms and legs in waiting room. When RN approached pt she stopped. Pupils PERRLA. Pt able to stand and transfer to wheelchair with stand by assist. Pt began asking "where am I? Where's James?" Pt not incontinent. Pt moved back to hallway bed.

## 2019-03-05 NOTE — ED Notes (Signed)
Pt upset about having to get swabbed for covid. Pt tearful and reluctant. Education and coping provided. covid swab collected, labeled with 2 pt identifiers, and brought to lab Labs drawn, labeled with 2 pt identifiers, and sent throazine given per [facility]. Name/DOB verified with pt. Pt provided with ice water. Pt denies any other needs at this time
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
## 2019-03-05 NOTE — Plan of Care (Signed)
Nurse discussed anxiety, depression, coping skills.

## 2019-03-05 NOTE — BHH Counselor (Signed)
Pt currently in the hallway, per nurse she will be moved into room soon, to call TTS soon as she is roomed to complete assessment

## 2019-03-05 NOTE — ED Notes (Signed)
Tech witnessed pt pick up her coat and set it in the wheelchair we placed in front of her as if she was agitated after we sat her in the waiting room. She propped her head on the wheel chair arm and moved her hair also as if agitated but with no weakness. We moved the pt and had her prop her legs in the chair so that she would not fall forward and tech had watched her move her bottom down in the seat.
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
## 2019-03-05 NOTE — ED Notes (Signed)
Pt placed in purple scrubs. 

## 2019-03-05 NOTE — ED Notes (Signed)
PIV initiated, 20G to [place]. IV flushes with 10 cc NS without s/s of infiltration. Positive blood return noted. Secured with tape and tegaderm  istat chem brought to lab CBG 84

## 2019-03-05 NOTE — Progress Notes (Signed)
Pt accepted to [facility]; bed 307-2    Mordecai Maes, NP is the accepting provider.    Dr. Mallie Darting is the attending provider.    Call report to [phone]    Jonathon @ [facility] ED notified.     Pt is voluntary and will be transported by [facility]   Pt is scheduled to arrive at [facility] at 11am today.   Audree Camel, LCSW, [place] Disposition [facility]/TTS [phone]

## 2019-03-05 NOTE — ED Notes (Signed)
Pt brought to ED HW15. When asked why pt presents to ED, pt demonstrating tangential thoughts and poor concentration. Pt poor historian. Pt talking to herself on ED cart. Pt redirectable and cooperative at this time

## 2019-03-05 NOTE — Progress Notes (Signed)
Adult Psychoeducational Group Note  Date:  03/05/2019 Time:  9:52 PM  Group Topic/Focus:  Wrap-Up Group:   The focus of this group is to help patients review their daily goal of treatment and discuss progress on daily workbooks.  Participation Level:  Active  Participation Quality:  Appropriate  Affect:  Appropriate  Cognitive:  Appropriate  Insight: Appropriate  Engagement in Group:  Engaged  Modes of Intervention:  Discussion  Additional Comments:  Patient attended group and said that her day was a 8. Her goal for today was to get back on her medications.   Lylah Lantis W Adriaan Maltese [id number], 9:52 PM

## 2019-03-06 LAB — HEMOGLOBIN A1C
Hgb A1c MFr Bld: 4.8 % (ref 4.8–5.6)
Mean Plasma Glucose: 91.06 mg/dL

## 2019-03-06 LAB — TSH: TSH: 2.056 u[IU]/mL (ref 0.350–4.500)

## 2019-03-06 LAB — LIPID PANEL
Cholesterol: 146 mg/dL (ref 0–200)
HDL: 43 mg/dL (ref >40)
LDL Cholesterol: 92 mg/dL (ref 0–99)
Total CHOL/HDL Ratio: 3.4 RATIO
Triglycerides: 57 mg/dL (ref <150)
VLDL: 11 mg/dL (ref 0–40)

## 2019-03-06 MED ORDER — HYDROXYZINE HCL 50 MG PO TABS
50.0000 mg | ORAL_TABLET | Freq: Every day | ORAL | Status: DC
  Administered 2019-03-06: 21:00:00 50 mg via ORAL
  Filled 2019-03-06 (×3): qty 1

## 2019-03-06 MED ORDER — IBUPROFEN 600 MG PO TABS
600.0000 mg | ORAL_TABLET | Freq: Once | ORAL | Status: AC
  Administered 2019-03-06: 600 mg via ORAL
  Filled 2019-03-06 (×2): qty 1

## 2019-03-06 MED ORDER — SUMATRIPTAN SUCCINATE 50 MG PO TABS
50.0000 mg | ORAL_TABLET | Freq: Once | ORAL | Status: AC
  Administered 2019-03-06: 22:00:00 50 mg via ORAL
  Filled 2019-03-06 (×2): qty 1

## 2019-03-06 NOTE — Progress Notes (Signed)
[facility] MD Progress Note  03/06/2019 1:48 PM [name]  MRN:  [id number]  Subjective: [name] reports, "I'm good. My mood is good. I'm leaving tomorrow".  Objective: Patient is a 29 year old female with a reported past psychiatric history significant for borderline personality disorder, posttraumatic stress disorder, bipolar disorder (?) And schizoaffective disorder (?) Who presented to the [facility] emergency department on 03/05/2019 with "passive suicidal thoughts". The patient reportedly had not been eating over the last several days, was not taking her medications, and was told by her boyfriend that she was "irritable" and needed to get help. Her current stressor is that she is currently in custody issue over the custody of her daughter. The patient has a longstanding past psychiatric history of mental illness. [name] is seen, chart reviewed. The chart findings discussed with the treatment. She is lying down in her bed in her room during this evaluation. She presents alert, oriented & aware of situations. Her presentation today is similar to her presentation on admission. She rambles on during presentations with her eyes rolling upon & down, most difficult to redirect. However, she is able to make her needs known. She denies any symptoms of depression or anxiety today. She denies any other complaints. She feels she is doing well & will be ready to be discharged tomorrow morning. She denies any SIHI, AVH, delusional thoughts or paranoia. She does not appear to be responding to any internal stimuli. [name] is in agreement to continue her current plan of care as already in progress. She says she does not like to attend group sessions even with encouragement.  Principal Problem: Bipolar I disorder, most recent episode (or current) manic ([place])  Diagnosis: Principal Problem:   Bipolar I disorder, most recent episode (or current) manic ([place]) Active Problems:   Schizoaffective  disorder, bipolar type ([place])  Total Time spent with patient: 25 minutes  Past Psychiatric History: See H&P  Past Medical History:  Past Medical History:  Diagnosis Date  . Acid reflux   . Anxiety   . Asthma    last attack 03/13/15 or 03/14/15  . Autism   . 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 Chronic constipation   . Depression   . Drug-seeking behavior   . Essential tremor   . Headache   . Overdose of acetaminophen 07/2017   and other meds  . Personality disorder ([employer])   . Schizo-affective psychosis ([place])   . Schizoaffective disorder, bipolar type ([place])   . Seizures [facility])    Last seizure December 2017  . Sleep apnea     Past Surgical History:  Procedure Laterality Date  . MOUTH SURGERY  2009 or 2010   Family History:  Family History  Problem Relation Age of Onset  . Mental illness Father   . Asthma Father   . PDD Brother   . Seizures Brother    Family Psychiatric  History: See H&P  Social History:  Social History   Substance and Sexual Activity  Alcohol Use No  . Alcohol/week: 1.0 standard drinks  . Types: 1 Standard drinks or equivalent per week     Social History   Substance and Sexual Activity  Drug Use No   Comment: History of cocaine use at age 31 for 4 months    Social History   Socioeconomic History  . Marital status: Widowed    Spouse name: Not on file  . Number of children: 0  . Years of education: Not on file  .  Highest education level: Not on file  Occupational History  . Occupation: disability  Tobacco Use  . Smoking status: Former Smoker    Packs/day: 0.00  . Smokeless tobacco: Never Used  . Tobacco comment: Smoked for 2  years age [age]  Substance and Sexual Activity  . Alcohol use: No    Alcohol/week: 1.0 standard drinks    Types: 1 Standard drinks or equivalent per week  . Drug use: No    Comment: History of cocaine use at age 61 for 4 months  . Sexual activity: Yes    Birth control/protection: None  Other Topics Concern  .  Not on file  Social History Narrative   Marital status: married; guardian is Furniture conservator/restorer      Children: none      Lives: with boyfriend, in two story home      Employment:  Disability      Tobacco: quit smoking; smoked for two years.      Alcohol ;none      Drugs: none   Has not traveled outside of the country.   Right handed         Social Determinants of Health   Financial Resource Strain:   . Difficulty of Paying Living Expenses: Not on file  Food Insecurity:   . Worried About Charity fundraiser in the Last Year: Not on file  . Ran Out of Food in the Last Year: Not on file  Transportation Needs:   . Lack of Transportation (Medical): Not on file  . Lack of Transportation (Non-Medical): Not on file  Physical Activity:   . Days of Exercise per Week: Not on file  . Minutes of Exercise per Session: Not on file  Stress:   . Feeling of Stress : Not on file  Social Connections:   . Frequency of Communication with Friends and Family: Not on file  . Frequency of Social Gatherings with Friends and Family: Not on file  . Attends Religious Services: Not on file  . Active Member of Clubs or Organizations: Not on file  . Attends Archivist Meetings: Not on file  . Marital Status: Not on file   Additional Social History:    Pain Medications: see MAR Prescriptions: see MAR Over the Counter: see MAR History of alcohol / drug use?: No history of alcohol / drug abuse Longest period of sobriety (when/how long): no drug/alcohol use Negative Consequences of Use: (never used drugs/alcohol) Withdrawal Symptoms: Other (Comment)  Sleep: Good  Appetite:  Good  Current Medications: Current Facility-Administered Medications  Medication Dose Route Frequency Provider Last Rate Last Admin  . acetaminophen (TYLENOL) tablet 650 mg  650 mg Oral Q6H PRN Sharma Covert, MD      . albuterol (VENTOLIN HFA) 108 (90 Base) MCG/ACT inhaler 1-2 puff  1-2 puff Inhalation Q6H PRN Sharma Covert, MD   2 puff at 03/05/19 1525  . hydrOXYzine (ATARAX/VISTARIL) tablet 25 mg  25 mg Oral Q4H PRN Sharma Covert, MD   25 mg at 03/06/19 [id number]  . hydrOXYzine (ATARAX/VISTARIL) tablet 50 mg  50 mg Oral QHS PRN,MR X 1 Clary, Cordie Grice, MD      . mometasone-formoterol [facility]) 200-5 MCG/ACT inhaler 2 puff  2 puff Inhalation BID Sharma Covert, MD   2 puff at 03/06/19 [phone]  . ondansetron (ZOFRAN) tablet 4 mg  4 mg Oral Q8H PRN Lindell Spar I, NP   4 mg at 03/05/19 1704  . risperiDONE (  RISPERDAL) tablet 2 mg  2 mg Oral BID Sharma Covert, MD   2 mg at 03/06/19 0806  . sertraline (ZOLOFT) tablet 25 mg  25 mg Oral Daily Sharma Covert, MD   25 mg at 03/06/19 0806  . traZODone (DESYREL) tablet 50 mg  50 mg Oral QHS PRN Sharma Covert, MD       Lab Results:  Results for orders placed or performed during the hospital encounter of 03/05/19 (from the past 48 hour(s))  Hemoglobin A1c     Status: None   Collection Time: 03/06/19  6:25 AM  Result Value Ref Range   Hgb A1c MFr Bld 4.8 4.8 - 5.6 %    Comment: (NOTE) Pre diabetes:          5.7%-6.4% Diabetes:              >6.4% Glycemic control for   <7.0% adults with diabetes    Mean Plasma Glucose 91.06 mg/dL    Comment: Performed at [facility], [place] [address], [place], [place] [address]  Lipid panel     Status: None   Collection Time: 03/06/19  6:25 AM  Result Value Ref Range   Cholesterol 146 0 - 200 mg/dL   Triglycerides 57 <150 mg/dL   HDL 43 >40 mg/dL   Total CHOL/HDL Ratio 3.4 RATIO   VLDL 11 0 - 40 mg/dL   LDL Cholesterol 92 0 - 99 mg/dL    Comment:        Total Cholesterol/HDL:CHD Risk Coronary Heart Disease Risk Table                     Men   Women  1/2 Average Risk   3.4   3.3  Average Risk       5.0   4.4  2 X Average Risk   9.6   7.1  3 X Average Risk  23.4   11.0        Use the calculated Patient Ratio above and the CHD Risk Table to determine the patient's CHD Risk.        ATP III  CLASSIFICATION (LDL):  <100     mg/dL   Optimal  100-129  mg/dL   Near or Above                    Optimal  130-159  mg/dL   Borderline  160-189  mg/dL   High  >190     mg/dL   Very High Performed at [place] [address], [place], [place] [address]   TSH     Status: None   Collection Time: 03/06/19  6:25 AM  Result Value Ref Range   TSH 2.056 0.350 - 4.500 uIU/mL    Comment: Performed by a 3rd Generation assay with a functional sensitivity of <=0.01 uIU/mL. Performed at [facility], [place] [address], [place], [place] [address]    Blood Alcohol level:  Lab Results  Component Value Date   [facility] <10 03/05/2019   ETH <10 [id number]   Metabolic Disorder Labs: Lab Results  Component Value Date   HGBA1C 4.8 03/06/2019   MPG 91.06 03/06/2019   MPG 93.93 07/25/2017   Lab Results  Component Value Date   PROLACTIN 129.0 (H) 07/25/2017   PROLACTIN 194.7 (H) 11/22/2016   Lab Results  Component Value Date   CHOL 146 03/06/2019   TRIG 57 03/06/2019  HDL 43 03/06/2019   CHOLHDL 3.4 03/06/2019   VLDL 11 03/06/2019   LDLCALC 92 03/06/2019   LDLCALC 107 (H) 07/25/2017   Physical Findings: AIMS: Facial and Oral Movements Muscles of Facial Expression: None, normal Lips and Perioral Area: None, normal Jaw: None, normal Tongue: None, normal,Extremity Movements Upper (arms, wrists, hands, fingers): None, normal Lower (legs, knees, ankles, toes): None, normal, Trunk Movements Neck, shoulders, hips: None, normal, Overall Severity Severity of abnormal movements (highest score from questions above): None, normal Incapacitation due to abnormal movements: None, normal Patient's awareness of abnormal movements (rate only patient's report): No Awareness, Dental Status Current problems with teeth and/or dentures?: No Does patient usually wear dentures?: No  CIWA:  CIWA-Ar Total: 7 COWS:  COWS Total Score: 4  Musculoskeletal: Strength & Muscle  Tone: within normal limits Gait & Station: normal Patient leans: N/A  Psychiatric Specialty Exam: Physical Exam  Nursing note and vitals reviewed. Constitutional: She is oriented to person, place, and time. She appears well-developed.  Cardiovascular: Normal rate.  Respiratory: Effort normal.  Genitourinary:    Genitourinary Comments: Deferred   Musculoskeletal:        General: Normal range of motion.     Cervical back: Normal range of motion.  Neurological: She is alert and oriented to person, place, and time.  Skin: Skin is warm and dry.    Review of Systems  Constitutional: Negative for chills, diaphoresis and fever.  HENT: Negative for congestion, rhinorrhea, sneezing and sore throat.   Respiratory: Negative for cough, shortness of breath and wheezing.   Cardiovascular: Negative for chest pain and palpitations.  Gastrointestinal: Negative for diarrhea, nausea and vomiting.  Genitourinary: Negative for difficulty urinating.  Musculoskeletal: Negative.   Skin: Negative for color change.  Allergic/Immunologic: Positive for food allergies (Shell fish & Coconut).  Neurological: Negative for dizziness and headaches.  Psychiatric/Behavioral: Positive for dysphoric mood ("Improving"). Negative for agitation, behavioral problems, confusion, decreased concentration, hallucinations, self-injury, sleep disturbance and suicidal ideas. The patient is not nervous/anxious and is not hyperactive.     Blood pressure 115/72, pulse 88, temperature 97.8 F (36.6 C), resp. rate 18, height 5\' 4"  (1.626 m), weight 82.1 kg, SpO2 95 %.Body mass index is 31.07 kg/m.  General Appearance:Casual  Eye Contact:Minimal  Speech:Pressured  Volume:Increased  Mood:Anxious, Dysphoric and Irritable  Affect:Congruent  Thought Process:Coherent and Descriptions of Associations:Circumstantial  Orientation:Full (Time, Place, and Person)  Thought Content:Rumination  Suicidal Thoughts:No   Homicidal Thoughts:No  Memory:Immediate;Poor Recent;Poor Remote;Poor  Judgement:Impaired  Insight:Lacking  Psychomotor Activity:Increased  Concentration:Concentration:Poorand Attention Span: Poor  Recall:Fair  Fund of Knowledge:Fair  Language:Good  Akathisia:Negative  Handed:Right  AIMS (if indicated):   Assets:Desire for Improvement Resilience  ADL's:Intact  Cognition:WNL    Sleep:  Number of Hours: 3.75   Treatment Plan Summary: Daily contact with patient to assess and evaluate symptoms and progress in treatment and Medication management.  -Continue inpatient hospitalization.  -Will continue today 03/06/2019 plan as below except where it is noted.  -Mood control  -Continue Risperdal 2 mg po bid.   -Continue zoloft 25 mg po qd for depression  -Anxiety  -Continue atarax 25 mg po q4h prn anxiety  -Insomnia  -Continue Trazodone 50 mg po Q hs prn.             -Continue Vistaril 50 mg po Q hs.  -Other medical issues  -Albuterol inhaler 1-2 puffs Q 6 hrs prn for SOB.   -Continue Dulera 2 puffs bid for SOB.             -  Continue Zofran 4 mg po Q 8 hrs prn for nausea/vomiting.  -Encourage participation in groups and therapeutic milieu  -Disposition planning will be ongoing  Lindell Spar, NP, PMHNP, FNP-BC 03/06/2019, 1:49 PM
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
## 2019-03-06 NOTE — Progress Notes (Addendum)
   03/06/19 1400  Psych Admission Type (Psych Patients Only)  Admission Status Voluntary  Psychosocial Assessment  Patient Complaints Anxiety  Eye Contact Brief  Facial Expression Anxious  Affect Anxious  Speech Pressured;Rapid  Interaction Assertive  Motor Activity Fidgety;Hyperactive  Appearance/Hygiene Disheveled;In scrubs  Behavior Characteristics Cooperative;Anxious  Mood Anxious  Thought Process  Coherency Disorganized;Flight of ideas  Content Blaming others  Delusions None reported or observed  Perception WDL  Hallucination None reported or observed  Judgment Impaired  Confusion None  Danger to Self  Current suicidal ideation? Denies  Self-Injurious Behavior No self-injurious ideation or behavior indicators observed or expressed   Agreement Not to Harm Self Yes  Description of Agreement verbal contract for safety  Danger to Others  Danger to Others None reported or observed    Pt has been calm and cooperative- visible in the dayroom interacting well with peer. Per pt's self inventory, pt rated her depression, hopelessness and anxiety a 0/2/7, respectively. Pt currently denies SI/HI and A/V H.

## 2019-03-06 NOTE — Tx Team (Addendum)
Interdisciplinary Treatment and Diagnostic Plan Update  03/06/2019 Time of Session:  [name] MRN: [id number]  Principal Diagnosis: Bipolar I disorder, most recent episode (or current) manic (HCC)  Secondary Diagnoses: Principal Problem:   Bipolar I disorder, most recent episode (or current) manic (HCC) Active Problems:   Schizoaffective disorder, bipolar type (HCC)   Current Medications:  Current Facility-Administered Medications  Medication Dose Route Frequency Provider Last Rate Last Admin  . acetaminophen (TYLENOL) tablet 650 mg  650 mg Oral Q6H PRN Clary, Greg Lawson, MD      . albuterol (VENTOLIN HFA) 108 (90 Base) MCG/ACT inhaler 1-2 puff  1-2 puff Inhalation Q6H PRN Clary, Greg Lawson, MD   2 puff at 03/05/19 1525  . hydrOXYzine (ATARAX/VISTARIL) tablet 25 mg  25 mg Oral Q4H PRN Clary, Greg Lawson, MD   25 mg at 03/06/19 0806  . hydrOXYzine (ATARAX/VISTARIL) tablet 50 mg  50 mg Oral QHS PRN,MR X 1 Clary, Greg Lawson, MD      . mometasone-formoterol (DULERA) 200-5 MCG/ACT inhaler 2 puff  2 puff Inhalation BID Clary, Greg Lawson, MD   2 puff at 03/06/19 0807  . ondansetron (ZOFRAN) tablet 4 mg  4 mg Oral Q8H PRN Nwoko, Agnes I, NP   4 mg at 03/05/19 1704  . risperiDONE (RISPERDAL) tablet 2 mg  2 mg Oral BID Clary, Greg Lawson, MD   2 mg at 03/06/19 0806  . sertraline (ZOLOFT) tablet 25 mg  25 mg Oral Daily Clary, Greg Lawson, MD   25 mg at 03/06/19 0806  . traZODone (DESYREL) tablet 50 mg  50 mg Oral QHS PRN Clary, Greg Lawson, MD       PTA Medications: Medications Prior to Admission  Medication Sig Dispense Refill Last Dose  . albuterol (PROVENTIL HFA;VENTOLIN HFA) 108 (90 Base) MCG/ACT inhaler Inhale 1-2 puffs into the lungs every 6 (six) hours as needed for wheezing or shortness of breath.     . fluticasone-salmeterol (ADVAIR HFA) 115-21 MCG/ACT inhaler Inhale 2 puffs into the lungs 2 (two) times daily. 1 Inhaler 5   . SUMAtriptan (IMITREX) 50 MG tablet Take 1  tablet earliest onset of migraine.  May repeat in 2 hours if headache persists or recurs.  Maximum 2 tablets in 24 hours. 10 tablet 3     Patient Stressors: Educational concerns Financial difficulties Legal issue Marital or family conflict Medication change or noncompliance Occupational concerns  Patient Strengths: Ability for insight Average or above average intelligence Capable of independent living Communication skills General fund of knowledge Motivation for treatment/growth  Treatment Modalities: Medication Management, Group therapy, Case management,  1 to 1 session with clinician, Psychoeducation, Recreational therapy.   Physician Treatment Plan for Primary Diagnosis: Bipolar I disorder, most recent episode (or current) manic (HCC) Long Term Goal(s): Improvement in symptoms so as ready for discharge Improvement in symptoms so as ready for discharge   Short Term Goals: Ability to verbalize feelings will improve Ability to demonstrate self-control will improve Ability to identify and develop effective coping behaviors will improve Compliance with prescribed medications will improve  Medication Management: Evaluate patient's response, side effects, and tolerance of medication regimen.  Therapeutic Interventions: 1 to 1 sessions, Unit Group sessions and Medication administration.  Evaluation of Outcomes: Not Met  Physician Treatment Plan for Secondary Diagnosis: Principal Problem:   Bipolar I disorder, most recent episode (or current) manic (HCC) Active Problems:   Schizoaffective disorder, bipolar type (HCC)  Long Term Goal(s): Improvement in symptoms so as ready for   discharge Improvement in symptoms so as ready for discharge   Short Term Goals: Ability to verbalize feelings will improve Ability to demonstrate self-control will improve Ability to identify and develop effective coping behaviors will improve Compliance with prescribed medications will improve      Medication Management: Evaluate patient's response, side effects, and tolerance of medication regimen.  Therapeutic Interventions: 1 to 1 sessions, Unit Group sessions and Medication administration.  Evaluation of Outcomes: Not Met   RN Treatment Plan for Primary Diagnosis: Bipolar I disorder, most recent episode (or current) manic ([place]) Long Term Goal(s): Knowledge of disease and therapeutic regimen to maintain health will improve  Short Term Goals: Ability to participate in decision making will improve, Ability to verbalize feelings will improve, Ability to disclose and discuss suicidal ideas, Ability to identify and develop effective coping behaviors will improve and Compliance with prescribed medications will improve  Medication Management: RN will administer medications as ordered by provider, will assess and evaluate patient's response and provide education to patient for prescribed medication. RN will report any adverse and/or side effects to prescribing provider.  Therapeutic Interventions: 1 on 1 counseling sessions, Psychoeducation, Medication administration, Evaluate responses to treatment, Monitor vital signs and CBGs as ordered, Perform/monitor CIWA, COWS, AIMS and Fall Risk screenings as ordered, Perform wound care treatments as ordered.  Evaluation of Outcomes: Not Met   LCSW Treatment Plan for Primary Diagnosis: Bipolar I disorder, most recent episode (or current) manic ([place]) Long Term Goal(s): Safe transition to appropriate next level of care at discharge, Engage patient in therapeutic group addressing interpersonal concerns.  Short Term Goals: Engage patient in aftercare planning with referrals and resources  Therapeutic Interventions: Assess for all discharge needs, 1 to 1 time with Social worker, Explore available resources and support systems, Assess for adequacy in community support network, Educate family and significant other(s) on suicide prevention, Complete  Psychosocial Assessment, Interpersonal group therapy.  Evaluation of Outcomes: Not Met   Progress in Treatment: Attending groups: No. New to unit Participating in groups: No. Taking medication as prescribed: Yes. Toleration medication: Yes. Family/Significant other contact made: No, will contact:  if patient consents to collateral contacts Patient understands diagnosis: Yes. Discussing patient identified problems/goals with staff: Yes. Medical problems stabilized or resolved: Yes. Denies suicidal/homicidal ideation: Yes. Issues/concerns per patient self-inventory: No. Other:   New problem(s) identified: None   New Short Term/Long Term Goal(s): medication stabilization, elimination of SI thoughts, development of comprehensive mental wellness plan.    Patient Goals:  " I just want to restart meds"   Discharge Plan or Barriers: Patient recently admitted. CSW will continue to follow and assess for appropriate referrals and possible discharge planning.    Reason for Continuation of Hospitalization: Anxiety Depression Medication stabilization Suicidal ideation  Estimated Length of Stay: 3-5 days   Attendees: Patient: [name]  03/06/2019 9:00 AM  Physician: Dr. Myles Lipps, MD 03/06/2019 9:00 AM  Nursing: Mateo Flow RN 03/06/2019 9:00 AM  RN Care Manager: 03/06/2019 9:00 AM  Social Worker: Radonna Ricker, LCSW 03/06/2019 9:00 AM  Recreational Therapist:  03/06/2019 9:00 AM  Other:  03/06/2019 9:00 AM  Other:  03/06/2019 9:00 AM  Other: 03/06/2019 9:00 AM    Scribe for Treatment Team: Marylee Floras, [place] 03/06/2019 9:00 AM

## 2019-03-06 NOTE — [facility] Group Notes (Signed)
Type of Therapy and Topic: Group Therapy: Core Beliefs  Participation Level: Active  Description of Group: In this group patients will be encouraged to explore their negative and positive core beliefs about themselves, others, and the world. Each patient will be challenged to identify these beliefs and ways to challenge negative core beliefs. This group will be process-oriented, with patients participating in exploration of their own experiences as well as giving and receiving support and challenge from other group members.  Therapeutic Goals: 1. Patient will identify personal core beliefs, both negative and positive. 2. Patient will identify core beliefs relating to others, both negative and positive. 3. Patient will challenge their negative beliefs about themselves and others. 4. Patient will identify three changes they can make to replace negative core beliefs with positive beliefs.  Summary of Patient Progress  Due to the COVID-19 pandemic, this group has been supplemented with worksheets.   Farren Nelles, MSW, LCSW Clinical Social Worker [facility]  Phone: [phone]  

## 2019-03-06 NOTE — [facility] Suicide Risk Assessment (Signed)
[facility] INPATIENT:  Family/Significant Other Suicide Prevention Education  Suicide Prevention Education:  Patient Refusal for Family/Significant Other Suicide Prevention Education: The patient [name] has refused to provide written consent for family/significant other to be provided Family/Significant Other Suicide Prevention Education during admission and/or prior to discharge.  Physician notified.  Joellen Jersey 03/06/2019, 9:44 AM

## 2019-03-07 MED ORDER — RISPERIDONE 3 MG PO TABS: 3.0000 mg | ORAL_TABLET | Freq: Every day | ORAL | 0 refills | Status: DC

## 2019-03-07 MED ORDER — RISPERIDONE 2 MG PO TABS
2.0000 mg | ORAL_TABLET | Freq: Every day | ORAL | Status: DC
  Filled 2019-03-07 (×2): qty 1

## 2019-03-07 MED ORDER — RISPERIDONE 2 MG PO TABS: 2.0000 mg | ORAL_TABLET | Freq: Every day | ORAL | 0 refills | Status: DC

## 2019-03-07 MED ORDER — HYDROXYZINE HCL 25 MG PO TABS: ORAL_TABLET | ORAL | 0 refills | Status: DC

## 2019-03-07 MED ORDER — TRAZODONE HCL 50 MG PO TABS: 50.0000 mg | ORAL_TABLET | Freq: Every evening | ORAL | 0 refills | Status: DC | PRN

## 2019-03-07 MED ORDER — SERTRALINE HCL 25 MG PO TABS: 25.0000 mg | ORAL_TABLET | Freq: Every day | ORAL | 0 refills | Status: DC

## 2019-03-07 MED ORDER — MOMETASONE FURO-FORMOTEROL FUM 200-5 MCG/ACT IN AERO: 2.0000 | INHALATION_SPRAY | Freq: Two times a day (BID) | RESPIRATORY_TRACT | Status: DC

## 2019-03-07 MED ORDER — RISPERIDONE 3 MG PO TABS
3.0000 mg | ORAL_TABLET | Freq: Every day | ORAL | Status: DC
  Filled 2019-03-07 (×2): qty 1

## 2019-03-07 NOTE — Progress Notes (Signed)
Pt discharged to lobby. Pt was stable and appreciative at that time. All papers and prescriptions were given and valuables returned. Verbal understanding expressed. Denies SI/HI Pt given opportunity to express concerns and ask questions.

## 2019-03-07 NOTE — Progress Notes (Signed)
   03/07/19 0146  Psych Admission Type (Psych Patients Only)  Admission Status Voluntary  Psychosocial Assessment  Patient Complaints None  Eye Contact Fair  Facial Expression Flat  Affect Appropriate to circumstance  Speech Logical/coherent  Interaction Assertive;Childlike  Appearance/Hygiene Unremarkable  Behavior Characteristics Cooperative;Appropriate to situation  Mood Anxious  Thought Process  Coherency Disorganized  Content WDL  Delusions WDL  Perception WDL  Hallucination Auditory  Judgment WDL  Confusion WDL  Danger to Self  Current suicidal ideation? Denies  Danger to Others  Danger to Others None reported or observed     Patient talking to self some at the window. Some flight of ideas and possibly responding to internal stimuli. Cooperative on the unit at this time.

## 2019-03-07 NOTE — [facility] Group Notes (Signed)
Adult Psychoeducational Group Note  Date:  03/07/2019 Time:  12:32 PM  Group Topic/Focus: Setting Goals Goals Group:   The focus of this group is to help patients establish daily goals to achieve during treatment and discuss how the patient can incorporate goal setting into their daily lives to aide in recovery.  Participation Level:  Active  Participation Quality:  Intrusive  Affect:  Appropriate  Cognitive:  Disorganized  Insight: Lacking  Engagement in Group:  Engaged  Modes of Intervention:  Limit-setting  Additional Comments:  Pt attended this group. Was hyper verbal and intrusive with others. Was able to set a goal. Stated she was going home today and wanted to not allow herself to get upset so she was going to take a shower and listen to music.   [name] 03/07/2019, 12:32 PM

## 2019-03-07 NOTE — Progress Notes (Signed)
  [facility] Adult Case Management Discharge Plan :  Will you be returning to the same living situation after discharge:  Yes,  with boyfriend At discharge, do you have transportation home?: Yes,  public transit Do you have the ability to pay for your medications: Yes,  insurance and disability income  Release of information consent forms completed and emailed to Medical Records, then turned in to Medical Records by CSW.   Patient to Follow up at: Follow-up Information    [facility] Follow up on 03/09/2019.   Why: You are scheduled for an appointment on 03/09/19 at 1:15 pm.  This will be a virtual appointment.  Please have your insurance information and your discharge summary from this hospitalization. Contact information: [place] [id number] [phone]           Next level of care provider has access to [place] and Suicide Prevention discussed: No.  Refused by patient  Have you used any form of tobacco in the last 30 days? (Cigarettes, Smokeless Tobacco, Cigars, and/or Pipes): No  Has patient been referred to the Quitline?: N/A patient is not a smoker  Patient has been referred for addiction treatment: N/A  Maretta Los, LCSW 03/07/2019, 8:59 AM
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
## 2019-03-07 NOTE — BHH Suicide Risk Assessment (Signed)
[facility] Discharge Suicide Risk Assessment   Principal Problem: Bipolar I disorder, most recent episode (or current) manic ([place]) Discharge Diagnoses: Principal Problem:   Bipolar I disorder, most recent episode (or current) manic ([place]) Active Problems:   Schizoaffective disorder, bipolar type ([place])   Total Time spent with patient: 15 minutes  Musculoskeletal: Strength & Muscle Tone: within normal limits Gait & Station: normal Patient leans: N/A  Psychiatric Specialty Exam: Review of Systems  All other systems reviewed and are negative.   Blood pressure (!) 108/56, pulse 87, temperature 97.8 F (36.6 C), resp. rate 18, height 5\' 4"  (1.626 m), weight 82.1 kg, SpO2 95 %.Body mass index is 31.07 kg/m.  General Appearance: Casual  Eye Contact::  Fair  Speech:  Normal Rate409  Volume:  Normal  Mood:  Anxious  Affect:  Congruent  Thought Process:  Coherent and Descriptions of Associations: Circumstantial  Orientation:  Full (Time, Place, and Person)  Thought Content:  Logical  Suicidal Thoughts:  No  Homicidal Thoughts:  No  Memory:  Immediate;   Fair Recent;   Fair Remote;   Fair  Judgement:  Intact  Insight:  Lacking  Psychomotor Activity:  Increased  Concentration:  Fair  Recall:  [employer] of Knowledge:Fair  Language: Fair  Akathisia:  Negative  Handed:  Right  AIMS (if indicated):     Assets:  Desire for Improvement Resilience  Sleep:  Number of Hours: 3  Cognition: WNL  ADL's:  Intact   Mental Status Per Nursing Assessment::   On Admission:  Suicidal ideation indicated by patient, Self-harm thoughts  Demographic Factors:  Caucasian, Low socioeconomic status and Unemployed  Loss Factors: Financial problems/change in socioeconomic status  Historical Factors: Impulsivity  Risk Reduction Factors:   Living with another person, especially a relative  Continued Clinical Symptoms:  Bipolar Disorder:   Mixed State Personality Disorders:   Cluster  B  Cognitive Features That Contribute To Risk:  Thought constriction (tunnel vision)    Suicide Risk:  Minimal: No identifiable suicidal ideation.  Patients presenting with no risk factors but with morbid ruminations; may be classified as minimal risk based on the severity of the depressive symptoms  Follow-up Information    [facility] Follow up on 03/09/2019.   Why: You are scheduled for an appointment on 03/09/19 at 1:15 pm.  This will be a virtual appointment.  Please have your insurance information and your discharge summary from this hospitalization. Contact information: [address] [place] [phone]           Plan Of Care/Follow-up recommendations:  Activity:  ad lib  Sharma Covert, MD 03/07/2019, 8:10 AM

## 2019-03-07 NOTE — BHH Group Notes (Signed)
LCSW Group Therapy Note  03/07/2019   10:00-11:00am   Type of Therapy and Topic:  Group Therapy: Anger Cues and Responses  Participation Level:  Did Not Attend   Description of Group:   In this group, patients learned how to recognize the physical, cognitive, emotional, and behavioral responses they have to anger-provoking situations.  They identified a recent time they became angry and how they reacted.  They analyzed how their reaction was possibly beneficial and how it was possibly unhelpful.  The group discussed a variety of healthier coping skills that could help with such a situation in the future.  Focus was placed on how helpful it is to recognize the underlying emotions to our anger, because working on those can lead to a more permanent solution as well as our ability to focus on the important rather than the urgent.  Therapeutic Goals: 1. Patients will remember their last incident of anger and how they felt emotionally and physically, what their thoughts were at the time, and how they behaved. 2. Patients will identify how their behavior at that time worked for them, as well as how it worked against them. 3. Patients will explore possible new behaviors to use in future anger situations. 4. Patients will learn that anger itself is normal and cannot be eliminated, and that healthier reactions can assist with resolving conflict rather than worsening situations.  Summary of Patient Progress:  The patient did not attend. Therapeutic Modalities:   Cognitive Behavioral Therapy  Rolanda Jay

## 2019-03-07 NOTE — Progress Notes (Signed)
D. Pt presents with a flat affect/ anxious mood-rapid/ pressured speech- calm, cooperative behavior on the unit. Pt visible in the dayroom attending group led by nurse this am. Pt currently denies SI/HI - continues to appear to be responding to internal stimuli- observed at times talking to herself. A. Labs and vitals monitored. Pt compliant with medications. Pt supported emotionally and encouraged to express concerns and ask questions.   R. Pt remains safe with 15 minute checks. Will continue POC.

## 2019-03-09 DIAGNOSIS — F250 Schizoaffective disorder, bipolar type: Secondary | ICD-10-CM | POA: Diagnosis not present

## 2019-03-14 ENCOUNTER — Other Ambulatory Visit: Payer: Self-pay

## 2019-03-14 ENCOUNTER — Emergency Department (HOSPITAL_COMMUNITY)
Admission: EM | Admit: 2019-03-14 | Discharge: 2019-03-14 | Disposition: A | Discharge status: Home / Self Care | Payer: PPO | Attending: Emergency Medicine | Admitting: Emergency Medicine

## 2019-03-14 ENCOUNTER — Encounter (HOSPITAL_COMMUNITY): Payer: Self-pay | Admitting: *Deleted

## 2019-03-14 DIAGNOSIS — R252 Cramp and spasm: Secondary | ICD-10-CM | POA: Insufficient documentation

## 2019-03-14 DIAGNOSIS — Z79899 Other long term (current) drug therapy: Secondary | ICD-10-CM | POA: Insufficient documentation

## 2019-03-14 DIAGNOSIS — R000 Tachycardia, unspecified: Secondary | ICD-10-CM | POA: Diagnosis not present

## 2019-03-14 DIAGNOSIS — F840 Autistic disorder: Secondary | ICD-10-CM | POA: Diagnosis not present

## 2019-03-14 DIAGNOSIS — F259 Schizoaffective disorder, unspecified: Secondary | ICD-10-CM | POA: Diagnosis not present

## 2019-03-14 DIAGNOSIS — R609 Edema, unspecified: Secondary | ICD-10-CM | POA: Diagnosis not present

## 2019-03-14 DIAGNOSIS — R509 Fever, unspecified: Secondary | ICD-10-CM | POA: Diagnosis not present

## 2019-03-14 MED ORDER — DIPHENHYDRAMINE HCL 25 MG PO CAPS
25.0000 mg | ORAL_CAPSULE | Freq: Once | ORAL | Status: AC
  Administered 2019-03-14: 07:00:00 25 mg via ORAL
  Filled 2019-03-14: qty 1

## 2019-03-14 NOTE — Discharge Instructions (Addendum)
This maybe related to medications that you are taking. Benadryl may help. If it does not please follow up with your psychiatrist to review medications and determine whether alternatives may be used.

## 2019-03-14 NOTE — ED Triage Notes (Signed)
Pt arrives from home via [facility] with c/o left leg swelling since yesterday, some pain associated with the same. 130/72, hr 100, 99.9, CBG 110.

## 2019-03-14 NOTE — ED Provider Notes (Signed)
[facility] EMERGENCY DEPARTMENT Provider Note  CSN: [id number] Arrival date & time: 03/14/19 0230  Chief Complaint(s) Leg Swelling  HPI [name] is a 29 y.o. female with a history listed below including psychiatric disorders on several psychiatric medication here for bilateral leg cramping for 1 day.  She denies any falls or trauma.  She believes she notices some swelling in the calves.  No edema.  No prior history of DVTs.  Not on birth control.  No recent travels or prolonged immobilization.  No other physical complaints.  HPI  Past Medical History Past Medical History:  Diagnosis Date  . Acid reflux   . Anxiety   . Asthma    last attack 03/13/15 or 03/14/15  . Autism   . 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 Chronic constipation   . Depression   . Drug-seeking behavior   . Essential tremor   . Headache   . Overdose of acetaminophen 07/2017   and other meds  . Personality disorder ([place])   . Schizo-affective psychosis ([place])   . Schizoaffective disorder, bipolar type ([place])   . Seizures [facility])    Last seizure December 2017  . Sleep apnea    Patient Active Problem List   Diagnosis Date Noted  . Bipolar I disorder, most recent episode (or current) manic ([place]) 03/05/2019  . Desire for pregnancy 07/03/2018  . Drug overdose 07/22/2017  . Overdose 07/22/2017  . Intentional acetaminophen overdose ([place])   . Metabolic acidosis   . DUB (dysfunctional uterine bleeding) 11/22/2016  . Hyperprolactinemia ([place]) 08/20/2016  . Tachycardia 01/13/2016  . 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  . Seizure disorder ([place]) 08/08/2015  . Chronic migraine 07/27/2015  . Asthma 04/15/2015  . Schizoaffective disorder, bipolar type ([place]) 03/10/2014  . Borderline personality disorder ([place]) 03/10/2014  . PTSD (post-traumatic stress disorder) 03/10/2014  . Autism spectrum disorder 06/15/2013   Home Medication(s) Prior to Admission medications   Medication Sig Start  Date End Date Taking? Authorizing Provider  albuterol (PROVENTIL HFA;VENTOLIN HFA) 108 (90 Base) MCG/ACT inhaler Inhale 1-2 puffs into the lungs every 6 (six) hours as needed for wheezing or shortness of breath.    [provider]  fluticasone-salmeterol (ADVAIR HFA) 115-21 MCG/ACT inhaler Inhale 2 puffs into the lungs 2 (two) times daily. 08/03/15   Timmothy Euler, MD  hydrOXYzine (ATARAX/VISTARIL) 25 MG tablet Take 1 tablet (25 mg) by mouth three times daily & 2 tablets (50 mg) at bedtime as needed: For sleep 03/07/19   Lindell Spar I, NP  mometasone-formoterol (DULERA) 200-5 MCG/ACT AERO Inhale 2 puffs into the lungs 2 (two) times daily. For SOB 03/07/19   Nwoko, Herbert Pun I, NP  risperiDONE (RISPERDAL) 2 MG tablet Take 1 tablet (2 mg total) by mouth daily. For mood control 03/08/19   Lindell Spar I, NP  risperiDONE (RISPERDAL) 3 MG tablet Take 1 tablet (3 mg total) by mouth at bedtime. For mood control 03/07/19   Lindell Spar I, NP  sertraline (ZOLOFT) 25 MG tablet Take 1 tablet (25 mg total) by mouth daily. For depression 03/08/19   Lindell Spar I, NP  SUMAtriptan (IMITREX) 50 MG tablet Take 1 tablet earliest onset of migraine.  May repeat in 2 hours if headache persists or recurs.  Maximum 2 tablets in 24 hours. 01/29/19   Pieter Partridge, DO  traZODone (DESYREL) 50 MG tablet Take 1 tablet (50 mg total) by mouth at bedtime as needed for sleep. 03/07/19  Encarnacion Slates, NP                                                                                                                                    Past Surgical History Past Surgical History:  Procedure Laterality Date  . MOUTH SURGERY  2009 or 2010   Family History Family History  Problem Relation Age of Onset  . Mental illness Father   . Asthma Father   . PDD Brother   . Seizures Brother     Social History Social History   Tobacco Use  . Smoking status: Former Smoker    Packs/day: 0.00  . Smokeless tobacco: Never Used  .  Tobacco comment: Smoked for 2  years age [age]  Substance Use Topics  . Alcohol use: No    Alcohol/week: 1.0 standard drinks    Types: 1 Standard drinks or equivalent per week  . Drug use: No    Comment: History of cocaine use at age 35 for 4 months   Allergies Bee venom, Coconut flavor, Geodon [ziprasidone hcl], Haloperidol and related, Lithium, Oxycodone, Seroquel [quetiapine fumarate], Shellfish allergy, Phenergan [promethazine hcl], Prilosec [omeprazole], Sulfa antibiotics, and Tegretol [carbamazepine]  Review of Systems Review of Systems All other systems are reviewed and are negative for acute change except as noted in the HPI  Physical Exam Vital Signs  I have reviewed the triage vital signs BP 120/72 (BP Location: Left Arm)   Pulse 88   Temp 98.3 F (36.8 C) (Oral)   Resp 17   SpO2 100%   Physical Exam Vitals reviewed.  Constitutional:      General: She is not in acute distress.    Appearance: She is well-developed. She is not diaphoretic.  HENT:     Head: Normocephalic and atraumatic.     Right Ear: External ear normal.     Left Ear: External ear normal.     Nose: Nose normal.  Eyes:     General: No scleral icterus.    Conjunctiva/sclera: Conjunctivae normal.  Neck:     Trachea: Phonation normal.  Cardiovascular:     Rate and Rhythm: Normal rate and regular rhythm.  Pulmonary:     Effort: Pulmonary effort is normal. No respiratory distress.     Breath sounds: No stridor.  Abdominal:     General: There is no distension.  Musculoskeletal:        General: Normal range of motion.     Cervical back: Normal range of motion.     Right lower leg: Tenderness present. No swelling. No edema.     Left lower leg: Tenderness present. No swelling. No edema.       Legs:     Comments: Muscular spasm  Neurological:     Mental Status: She is alert and oriented to person, place, and time.  Psychiatric:        Behavior: Behavior normal.  ED Results and  Treatments Labs (all labs ordered are listed, but only abnormal results are displayed) Labs Reviewed - No data to display                                                                                                                       EKG  EKG Interpretation  Date/Time:    Ventricular Rate:    PR Interval:    QRS Duration:   QT Interval:    QTC Calculation:   R Axis:     Text Interpretation:        Radiology No results found.  Pertinent labs & imaging results that were available during my care of the patient were reviewed by me and considered in my medical decision making (see chart for details).  Medications Ordered in ED Medications  diphenhydrAMINE (BENADRYL) capsule 25 mg (25 mg Oral Given 03/14/19 [id number])                                                                                                                                    Procedures Procedures  (including critical care time)  Medical Decision Making / ED Course I have reviewed the nursing notes for this encounter and the patient's prior records (if available in EHR or on provided paperwork).   Charle Cotey was evaluated in Emergency Department on 03/14/2019 for the symptoms described in the history of present illness. She was evaluated in the context of the global COVID-19 pandemic, which necessitated consideration that the patient might be at risk for infection with the SARS-CoV-2 virus that causes COVID-19. Institutional protocols and algorithms that pertain to the evaluation of patients at risk for COVID-19 are in a state of rapid change based on information released by regulatory bodies including the CDC and federal and state organizations. These policies and algorithms were followed during the patient's care in the ED.  Here with muscular spasms of the calves. No swelling noted or edema. Doubt DVT. No evidence of infection. Given her psych meds, benadryl given.  The patient appears reasonably  screened and/or stabilized for discharge and I doubt any other medical condition or other [facility] requiring further screening, evaluation, or treatment in the ED at this time prior to discharge.  The patient is safe for discharge with strict return precautions.       Final Clinical Impression(s) / ED Diagnoses Final diagnoses:  Leg cramping  The patient appears reasonably screened and/or stabilized for discharge and I doubt any other medical condition or other [facility] requiring further screening, evaluation, or treatment in the ED at this time prior to discharge.  Disposition: Discharge  Condition: Good  I have discussed the results, Dx and Tx plan with the patient who expressed understanding and agree(s) with the plan. Discharge instructions discussed at great length. The patient was given strict return precautions who verbalized understanding of the instructions. No further questions at time of discharge.    ED Discharge Orders    None      Follow Up: [facility] [address] [place] [address] [phone]  Schedule an appointment as soon as possible for a visit       This chart was dictated using voice recognition software.  Despite best efforts to proofread,  errors can occur which can change the documentation meaning.   Fatima Blank, MD 03/14/19 [phone]

## 2019-03-14 NOTE — ED Notes (Signed)
Discharge instructions discussed with pt. Pt verbalized understanding with no questions at this time. Pt to [facility] with [facility] concerning medication.

## 2019-03-16 ENCOUNTER — Encounter (HOSPITAL_COMMUNITY): Payer: Self-pay | Admitting: Emergency Medicine

## 2019-03-16 ENCOUNTER — Emergency Department (HOSPITAL_COMMUNITY)
Admission: EM | Admit: 2019-03-16 | Discharge: 2019-03-16 | Disposition: A | Discharge status: Other Acute Inpatient Hospital | Payer: PPO | Attending: Emergency Medicine | Admitting: Emergency Medicine

## 2019-03-16 ENCOUNTER — Other Ambulatory Visit: Payer: Self-pay

## 2019-03-16 DIAGNOSIS — Z20822 Contact with and (suspected) exposure to covid-19: Secondary | ICD-10-CM | POA: Insufficient documentation

## 2019-03-16 DIAGNOSIS — Z87891 Personal history of nicotine dependence: Secondary | ICD-10-CM | POA: Diagnosis not present

## 2019-03-16 DIAGNOSIS — F329 Major depressive disorder, single episode, unspecified: Secondary | ICD-10-CM

## 2019-03-16 DIAGNOSIS — F250 Schizoaffective disorder, bipolar type: Secondary | ICD-10-CM | POA: Insufficient documentation

## 2019-03-16 DIAGNOSIS — Z592 Discord with neighbors, lodgers and landlord: Secondary | ICD-10-CM

## 2019-03-16 DIAGNOSIS — R45851 Suicidal ideations: Secondary | ICD-10-CM | POA: Insufficient documentation

## 2019-03-16 DIAGNOSIS — Z79899 Other long term (current) drug therapy: Secondary | ICD-10-CM | POA: Diagnosis not present

## 2019-03-16 DIAGNOSIS — F4310 Post-traumatic stress disorder, unspecified: Secondary | ICD-10-CM | POA: Insufficient documentation

## 2019-03-16 DIAGNOSIS — Z03818 Encounter for observation for suspected exposure to other biological agents ruled out: Secondary | ICD-10-CM | POA: Diagnosis not present

## 2019-03-16 DIAGNOSIS — F32A Depression, unspecified: Secondary | ICD-10-CM

## 2019-03-16 DIAGNOSIS — F603 Borderline personality disorder: Secondary | ICD-10-CM | POA: Diagnosis present

## 2019-03-16 DIAGNOSIS — J45909 Unspecified asthma, uncomplicated: Secondary | ICD-10-CM | POA: Diagnosis not present

## 2019-03-16 LAB — CBC WITH DIFFERENTIAL/PLATELET
Abs Immature Granulocytes: 0.07 10*3/uL (ref 0.00–0.07)
Basophils Absolute: 0 10*3/uL (ref 0.0–0.1)
Basophils Relative: 0 %
Eosinophils Absolute: 0.1 10*3/uL (ref 0.0–0.5)
Eosinophils Relative: 1 %
HCT: 39.2 % (ref 36.0–46.0)
Hemoglobin: 12.4 g/dL (ref 12.0–15.0)
Immature Granulocytes: 1 %
Lymphocytes Relative: 26 %
Lymphs Abs: 1.9 10*3/uL (ref 0.7–4.0)
MCH: 27.4 pg (ref 26.0–34.0)
MCHC: 31.6 g/dL (ref 30.0–36.0)
MCV: 86.7 fL (ref 80.0–100.0)
Monocytes Absolute: 0.4 10*3/uL (ref 0.1–1.0)
Monocytes Relative: 6 %
Neutro Abs: 4.7 10*3/uL (ref 1.7–7.7)
Neutrophils Relative %: 66 %
Platelets: 231 10*3/uL (ref 150–400)
RBC: 4.52 MIL/uL (ref 3.87–5.11)
RDW: 13.2 % (ref 11.5–15.5)
WBC: 7.1 10*3/uL (ref 4.0–10.5)
nRBC: 0 % (ref 0.0–0.2)

## 2019-03-16 LAB — I-STAT BETA HCG BLOOD, ED (MC, WL, AP ONLY): I-stat hCG, quantitative: <5 m[IU]/mL (ref <5)

## 2019-03-16 LAB — ETHANOL: Alcohol, Ethyl (B): <10 mg/dL (ref <10)

## 2019-03-16 LAB — RAPID URINE DRUG SCREEN, HOSP PERFORMED
Amphetamines: NOT DETECTED
Barbiturates: NOT DETECTED
Benzodiazepines: NOT DETECTED
Cocaine: NOT DETECTED
Opiates: NOT DETECTED
Tetrahydrocannabinol: NOT DETECTED

## 2019-03-16 LAB — COMPREHENSIVE METABOLIC PANEL
ALT: 17 U/L (ref 0–44)
AST: 19 U/L (ref 15–41)
Albumin: 3.9 g/dL (ref 3.5–5.0)
Alkaline Phosphatase: 69 U/L (ref 38–126)
Anion gap: 9 (ref 5–15)
BUN: 10 mg/dL (ref 6–20)
CO2: 20 mmol/L — ABNORMAL LOW (ref 22–32)
Calcium: 8.8 mg/dL — ABNORMAL LOW (ref 8.9–10.3)
Chloride: 109 mmol/L (ref 98–111)
Creatinine, Ser: 0.73 mg/dL (ref 0.44–1.00)
GFR calc Af Amer: >60 mL/min (ref >60)
GFR calc non Af Amer: >60 mL/min (ref >60)
Glucose, Bld: 110 mg/dL — ABNORMAL HIGH (ref 70–99)
Potassium: 3.9 mmol/L (ref 3.5–5.1)
Sodium: 138 mmol/L (ref 135–145)
Total Bilirubin: 0.4 mg/dL (ref 0.3–1.2)
Total Protein: 6.9 g/dL (ref 6.5–8.1)

## 2019-03-16 LAB — RESPIRATORY PANEL BY RT PCR (FLU A&B, COVID)
Influenza A by PCR: NEGATIVE
Influenza B by PCR: NEGATIVE
SARS Coronavirus 2 by RT PCR: NEGATIVE

## 2019-03-16 LAB — SALICYLATE LEVEL: Salicylate Lvl: <7 mg/dL — ABNORMAL LOW (ref 7.0–30.0)

## 2019-03-16 LAB — ACETAMINOPHEN LEVEL: Acetaminophen (Tylenol), Serum: <10 ug/mL — ABNORMAL LOW (ref 10–30)

## 2019-03-16 MED ORDER — SERTRALINE HCL 50 MG PO TABS
50.0000 mg | ORAL_TABLET | Freq: Every day | ORAL | Status: DC
  Administered 2019-03-16: 11:00:00 50 mg via ORAL
  Filled 2019-03-16: qty 1

## 2019-03-16 MED ORDER — ALBUTEROL SULFATE HFA 108 (90 BASE) MCG/ACT IN AERS: 1.0000 | INHALATION_SPRAY | Freq: Four times a day (QID) | RESPIRATORY_TRACT | Status: DC | PRN

## 2019-03-16 MED ORDER — RISPERIDONE 2 MG PO TABS: 3.0000 mg | ORAL_TABLET | Freq: Every day | ORAL | Status: DC

## 2019-03-16 MED ORDER — FAMOTIDINE 20 MG PO TABS
10.0000 mg | ORAL_TABLET | Freq: Every day | ORAL | Status: DC
  Administered 2019-03-16: 11:00:00 10 mg via ORAL
  Filled 2019-03-16: qty 1

## 2019-03-16 MED ORDER — MOMETASONE FURO-FORMOTEROL FUM 200-5 MCG/ACT IN AERO
2.0000 | INHALATION_SPRAY | Freq: Two times a day (BID) | RESPIRATORY_TRACT | Status: DC
  Filled 2019-03-16: qty 8.8
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
## 2019-03-16 NOTE — ED Provider Notes (Signed)
[facility] DEPT Provider Note   CSN: [id number] Arrival date & time: 03/16/19  0033     History Chief Complaint  Patient presents with  . Suicidal    [name] is a 29 y.o. female.  Patient to ED with depression and suicidal thoughts. She was seen by [facility] earlier in the week but returns with worsening symptoms. She arrives with [employer] voluntarily. No HI, AVH.  The history is provided by the patient.       Past Medical History:  Diagnosis Date  . Acid reflux   . Anxiety   . Asthma    last attack 03/13/15 or 03/14/15  . Autism   . 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 Chronic constipation   . Depression   . Drug-seeking behavior   . Essential tremor   . Headache   . Overdose of acetaminophen 07/2017   and other meds  . Personality disorder ([place])   . Schizo-affective psychosis ([place])   . Schizoaffective disorder, bipolar type ([place])   . Seizures [facility])    Last seizure December 2017  . Sleep apnea     Patient Active Problem List   Diagnosis Date Noted  . Bipolar I disorder, most recent episode (or current) manic ([place]) 03/05/2019  . Desire for pregnancy 07/03/2018  . Drug overdose 07/22/2017  . Overdose 07/22/2017  . Intentional acetaminophen overdose ([place])   . Metabolic acidosis   . DUB (dysfunctional uterine bleeding) 11/22/2016  . Hyperprolactinemia ([place]) 08/20/2016  . Tachycardia 01/13/2016  . 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  . Seizure disorder ([place]) 08/08/2015  . Chronic migraine 07/27/2015  . Asthma 04/15/2015  . Schizoaffective disorder, bipolar type ([place]) 03/10/2014  . Borderline personality disorder ([place]) 03/10/2014  . PTSD (post-traumatic stress disorder) 03/10/2014  . Autism spectrum disorder 06/15/2013    Past Surgical History:  Procedure Laterality Date  . MOUTH SURGERY  2009 or 2010     OB History    Gravida  2   Para  1   Term  1   Preterm      AB  1   Living  1     SAB  1   TAB     Ectopic      Multiple  0   Live Births  1           Family History  Problem Relation Age of Onset  . Mental illness Father   . Asthma Father   . PDD Brother   . Seizures Brother     Social History   Tobacco Use  . Smoking status: Former Smoker    Packs/day: 0.00  . Smokeless tobacco: Never Used  . Tobacco comment: Smoked for 2  years age [age]  Substance Use Topics  . Alcohol use: No    Alcohol/week: 1.0 standard drinks    Types: 1 Standard drinks or equivalent per week  . Drug use: No    Comment: History of cocaine use at age 32 for 4 months    Home Medications Prior to Admission medications   Medication Sig Start Date End Date Taking? Authorizing Provider  albuterol (PROVENTIL HFA;VENTOLIN HFA) 108 (90 Base) MCG/ACT inhaler Inhale 1-2 puffs into the lungs every 6 (six) hours as needed for wheezing or shortness of breath.   Yes [provider]  Ascorbic Acid (VITAMIN C WITH ROSE HIPS) 500 MG tablet Take 500 mg by mouth daily.   Yes [provider]  bismuth subsalicylate (PEPTO BISMOL) 262 MG chewable tablet Chew 524 mg by mouth as needed for indigestion.   Yes [provider]  Capsaicin (MUSCLE RELIEF EX) Apply 1 application topically 2 (two) times daily as needed (pain).   Yes [provider]  famotidine (PEPCID) 10 MG tablet Take 10 mg by mouth daily.   Yes [provider]  fluticasone-salmeterol (ADVAIR HFA) 115-21 MCG/ACT inhaler Inhale 2 puffs into the lungs 2 (two) times daily. 08/03/15  Yes Timmothy Euler, MD  hydrOXYzine (ATARAX/VISTARIL) 25 MG tablet Take 1 tablet (25 mg) by mouth three times daily & 2 tablets (50 mg) at bedtime as needed: For sleep Patient taking differently: Take 25-50 mg by mouth See admin instructions. Take 1 tablet (25 mg) by mouth three times daily & 2 tablets (50 mg) at bedtime as needed: For sleep 03/07/19  Yes Lindell Spar I, NP  ibuprofen (ADVIL) 200 MG tablet Take 400 mg by mouth every  6 (six) hours as needed for moderate pain.   Yes [provider]  risperiDONE (RISPERDAL) 2 MG tablet Take 1 tablet (2 mg total) by mouth daily. For mood control Patient taking differently: Take 2 mg by mouth every morning. For mood control 03/08/19  Yes Nwoko, Herbert Pun I, NP  risperiDONE (RISPERDAL) 3 MG tablet Take 1 tablet (3 mg total) by mouth at bedtime. For mood control 03/07/19  Yes Nwoko, Herbert Pun I, NP  sertraline (ZOLOFT) 50 MG tablet Take 50 mg by mouth daily.   Yes [provider]  SUMAtriptan (IMITREX) 50 MG tablet Take 1 tablet earliest onset of migraine.  May repeat in 2 hours if headache persists or recurs.  Maximum 2 tablets in 24 hours. Patient taking differently: Take 50 mg by mouth every 2 (two) hours as needed. May repeat in 2 hours if headache persists or recurs.  Maximum 2 tablets in 24 hours. 01/29/19  Yes Jaffe, Adam R, DO  vitamin B-12 (CYANOCOBALAMIN) 1000 MCG tablet Take 2,000 mcg by mouth daily.   Yes [provider]  vitamin E 200 UNIT capsule Take 200 Units by mouth 2 (two) times daily.   Yes [provider]  mometasone-formoterol (DULERA) 200-5 MCG/ACT AERO Inhale 2 puffs into the lungs 2 (two) times daily. For SOB Patient not taking: Reported on 03/16/2019 03/07/19   Lindell Spar I, NP  sertraline (ZOLOFT) 25 MG tablet Take 1 tablet (25 mg total) by mouth daily. For depression Patient not taking: Reported on 03/16/2019 03/08/19   Lindell Spar I, NP  traZODone (DESYREL) 50 MG tablet Take 1 tablet (50 mg total) by mouth at bedtime as needed for sleep. Patient not taking: Reported on 03/16/2019 03/07/19   Lindell Spar I, NP    Allergies    Bee venom, Coconut flavor, Geodon [ziprasidone hcl], Haloperidol and related, Lithium, Oxycodone, Seroquel [quetiapine fumarate], Shellfish allergy, Phenergan [promethazine hcl], Prilosec [omeprazole], Sulfa antibiotics, and Tegretol [carbamazepine]  Review of Systems   Review of Systems  Constitutional:  Negative for chills and fever.  HENT: Negative.   Respiratory: Negative.   Cardiovascular: Negative.   Gastrointestinal: Negative.   Musculoskeletal: Negative.   Skin: Negative.   Neurological: Negative.   Psychiatric/Behavioral: Positive for dysphoric mood and suicidal ideas. Negative for hallucinations and self-injury.    Physical Exam Updated Vital Signs There were no vitals taken for this visit.  Physical Exam Constitutional:      Appearance: She is well-developed.  HENT:     Head: Normocephalic.  Cardiovascular:  Rate and Rhythm: Normal rate and regular rhythm.  Pulmonary:     Effort: Pulmonary effort is normal.     Breath sounds: Normal breath sounds.  Abdominal:     General: Bowel sounds are normal.     Palpations: Abdomen is soft.     Tenderness: There is no abdominal tenderness. There is no guarding or rebound.  Musculoskeletal:        General: Normal range of motion.     Cervical back: Normal range of motion and neck supple.  Skin:    General: Skin is warm and dry.     Findings: No rash.  Neurological:     Mental Status: She is alert and oriented to person, place, and time.  Psychiatric:        Attention and Perception: Attention and perception normal.        Mood and Affect: Affect is blunt.        Speech: Speech normal.        Behavior: Behavior is cooperative.        Thought Content: Thought content includes suicidal ideation.     ED Results / Procedures / Treatments   Labs (all labs ordered are listed, but only abnormal results are displayed) Labs Reviewed  SALICYLATE LEVEL - Abnormal; Notable for the following components:      Result Value   Salicylate Lvl [id number] (*)    All other components within normal limits  COMPREHENSIVE METABOLIC PANEL - Abnormal; Notable for the following components:   CO2 20 (*)    Glucose, Bld 110 (*)    Calcium 8.8 (*)    All other components within normal limits  ACETAMINOPHEN LEVEL - Abnormal; Notable for the  following components:   Acetaminophen (Tylenol), Serum <10 (*)    All other components within normal limits  RESPIRATORY PANEL BY RT PCR (FLU A&B, COVID)  CBC WITH DIFFERENTIAL/PLATELET  ETHANOL  RAPID URINE DRUG SCREEN, HOSP PERFORMED  I-STAT BETA HCG BLOOD, ED ([facility], [facility], [facility] ONLY)    EKG None  Radiology No results found.  Procedures Procedures (including critical care time)  Medications Ordered in ED Medications  albuterol (VENTOLIN HFA) 108 (90 Base) MCG/ACT inhaler 1-2 puff (has no administration in time range)  famotidine (PEPCID) tablet 10 mg (has no administration in time range)  mometasone-formoterol (DULERA) 200-5 MCG/ACT inhaler 2 puff (has no administration in time range)  risperiDONE (RISPERDAL) tablet 3 mg (has no administration in time range)  sertraline (ZOLOFT) tablet 50 mg (has no administration in time range)    ED Course  I have reviewed the triage vital signs and the nursing notes.  Pertinent labs & imaging results that were available during my care of the patient were reviewed by me and considered in my medical decision making (see chart for details).    MDM Rules/Calculators/A&P                      Patient to ED with SI, depression, history of same. No physical complaints.   Will medically clear and request TTS consult to determine disposition.   Per TTS: patient meets inpatient criteria. No beds at [facility]. Placement being sought.   Final Clinical Impression(s) / ED Diagnoses Final diagnoses:  None   1. SI 2. Depression  Rx / DC Orders ED Discharge Orders    None       Dennie Bible 03/16/19 [place], MD 03/16/19 [phone]

## 2019-03-16 NOTE — ED Notes (Signed)
Pt was not responding to internal stimuli.
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
## 2019-03-16 NOTE — ED Triage Notes (Signed)
Patient here from home via [employer] voluntary with complaints of suicidal ideation. States "I have a lot going on in my life right now". States that she was at [facility] last week but now she would like to be admitted to inpatient.
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
## 2019-03-16 NOTE — Discharge Instructions (Signed)
Follow up with current outpatient psychiatric provider ?
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
## 2019-03-30 DIAGNOSIS — G4733 Obstructive sleep apnea (adult) (pediatric): Secondary | ICD-10-CM | POA: Diagnosis not present

## 2019-03-31 ENCOUNTER — Emergency Department (HOSPITAL_COMMUNITY)
Admission: EM | Admit: 2019-03-31 | Discharge: 2019-03-31 | Disposition: A | Discharge status: Home / Self Care | Payer: PPO | Attending: Emergency Medicine | Admitting: Emergency Medicine

## 2019-03-31 ENCOUNTER — Other Ambulatory Visit: Payer: Self-pay

## 2019-03-31 ENCOUNTER — Encounter (HOSPITAL_COMMUNITY): Payer: Self-pay | Admitting: *Deleted

## 2019-03-31 DIAGNOSIS — Z5321 Procedure and treatment not carried out due to patient leaving prior to being seen by health care provider: Secondary | ICD-10-CM | POA: Diagnosis not present

## 2019-03-31 DIAGNOSIS — R42 Dizziness and giddiness: Secondary | ICD-10-CM | POA: Diagnosis not present

## 2019-03-31 DIAGNOSIS — R110 Nausea: Secondary | ICD-10-CM | POA: Diagnosis not present

## 2019-03-31 NOTE — ED Triage Notes (Addendum)
BIB EMS with dizziness and some nausea, no pain or headache 124/90-96-98% RA CBG 122. While stating how dizzy she is, she continues to text.

## 2019-03-31 NOTE — ED Notes (Signed)
Patty, RN made this Probation officer aware that patient left the facility at 18:14. Unknown of reason.

## 2019-04-09 ENCOUNTER — Encounter: Payer: Self-pay | Admitting: Neurology

## 2019-04-09 NOTE — Progress Notes (Signed)
Virtual Visit via Video Note The purpose of this virtual visit is to provide medical care while limiting exposure to the novel coronavirus.    Consent was obtained for video visit:  Yes.   Answered questions that patient had about telehealth interaction:  Yes.   I discussed the limitations, risks, security and privacy concerns of performing an evaluation and management service by telemedicine. I also discussed with the patient that there may be a patient responsible charge related to this service. The patient expressed understanding and agreed to proceed.  Pt location: Home Physician Location: office Name of referring provider:  Kathyrn Lass, MD I connected with [name] at patients initiation/request on 04/10/2019 at  1:30 PM EST by video enabled telemedicine application and verified that I am speaking with the correct person using two identifiers. Pt MRN:  [id number] Pt DOB:  [birth year] Video Participants:  [name]   History of Present Illness:  [name] is a 29year old right-handed female with schizoaffective disorder, migraine and asthma who presents for migraines and seizure disorder. She is accompanied by her mother-in-law who supplements history.  MIGRAINES: Update: Headaches have overall been controlled, but she reports daily headaches for the past 2 weeks.  She thinks she may be pregnant.   Intensity: Moderate to severe Duration: several hours Frequency:20 days a month Current NSAIDS: ibuprofen Current analgesics:no Current triptans: sumatriptan 50mg  Current anti-emetic: no Current muscle relaxants: no Current anti-anxiolytic: hydroxyzine Current sleep aide: no Current Antihypertensive medications: no Current Antidepressant/mood medications:risperidone; sertraline 50mg  daily Current Anticonvulsant medications: none Current Vitamins/Herbal/Supplements: prenatal Current Antihistamines/Decongestants: no Other therapy:  Botox (status post 1 round) Birth Control:no  Depression/anxiety: no Sleep hygiene: Good Exercise: No  Hydration: yes  History: Onset: Teenager Location: varies Quality: pounding Initial intensity: 10/10 Aura: no Prodrome: no Postdrome: no Associated symptoms: Photophobia, phonophobia. Sometimes nausea. She has not had any new worse headache of her life, waking up from sleep Initial duration: All day InitialFrequency: 3 to 5 days per week InitialFrequency of abortive medication: 3 to 5 days per week Triggers: pregnancy Relieving factors: None Activity: Cannot function.  Past NSAIDS: ibuprofen Past analgesics: no Past abortive triptans: no Past muscle relaxants: no Past anti-emetic: promethazine 25mg  Past antihypertensive medications: metoprolol Past antidepressant medications:sertraline Past anticonvulsant medications: lamotrigine 50mg , Keppra 500mg  twice daily; Depakote (for seizure prophylaxis); topiramate 100mg twice daily (irritability, GI upset) Past vitamins/Herbal/Supplements: no Other past therapies: no  SEIZURE DISORDER: Update: No recurrent seizures.. 02/02/2019 EEG:  Normal awake and drowsy Current antiepileptic medication:none  History: She had a concussion on 04/15/15 after falling out of bed and hitting her head on the floor. She reports loss of consciousness for about a minute. She had nausea and vomiting and went to the ED where CT of head was unremarkable. She soon developed new onset seizures. They have been unwitnessed. She loses consciousness for less than a minute. She does not bite her tongue or have incontinence. They were occurring about 2 to 3 times a month. She initially was on Depakote, which was subsequently changed to [place] when she became pregnant. She has responded well to [place]. Her last seizure was in December 2017. After giving birth in February 2018, she had steadily been decreasing her dose  of Keppra under the supervision of her previous neurologist (from 750mg  three times daily to 1000mg  twice daily to 750mg  twice daily) and subsequently discontinued it.   She has no previous history of seizures. She has history of repeated closed head trauma. There  is no known family history of seizures.  MRI of brain without contrast from 06/01/15 was personally reviewed and was normal. EEG from 07/21/15 was normal.  Past antiepileptic medication: Depakote ER 1000mg  daily (helpful but discontinued due to pregnancy)  PSYCHIATRIC HISTORY: She has an extensive psychiatric history. She carries a diagnosis of schizoaffective disorder, Bipolar disorder, schizophrenia and borderline personality disorder. She has history of prior suicide attempts. She is treated at [facility]. She is a ward of the state and was unable to keep her baby. Her husband had passed away and she is under the care of her mother-in-law. Her mother-in-law would like more extensive testing to narrow down and determine an accurate psychiatric diagnosis.  Of note, she has been noted to have elevated prolactin levels. Her GYN ordered an MRI of the brain which has not been performed. She has upcoming appointment with him to discuss further.  Past Medical History: Past Medical History:  Diagnosis Date  . Acid reflux   . Anxiety   . Asthma    last attack 03/13/15 or 03/14/15  . Autism   . 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 Chronic constipation   . Depression   . Drug-seeking behavior   . Essential tremor   . Headache   . Overdose of acetaminophen 07/2017   and other meds  . Personality disorder ([place])   . Schizo-affective psychosis ([place])   . Schizoaffective disorder, bipolar type ([place])   . Seizures [facility])    Last seizure December 2017  . Sleep apnea     Medications: Outpatient Encounter Medications as of 04/10/2019  Medication Sig  . albuterol (PROVENTIL HFA;VENTOLIN HFA) 108 (90 Base) MCG/ACT inhaler Inhale 1-2  puffs into the lungs every 6 (six) hours as needed for wheezing or shortness of breath.  . Capsaicin (MUSCLE RELIEF EX) Apply 1 application topically 2 (two) times daily as needed (pain).  . famotidine (PEPCID) 10 MG tablet Take 10 mg by mouth daily.  . fluticasone-salmeterol (ADVAIR HFA) 115-21 MCG/ACT inhaler Inhale 2 puffs into the lungs 2 (two) times daily.  . hydrOXYzine (ATARAX/VISTARIL) 25 MG tablet Take 1 tablet (25 mg) by mouth three times daily & 2 tablets (50 mg) at bedtime as needed: For sleep (Patient taking differently: Take 25-50 mg by mouth See admin instructions. Take 1 tablet (25 mg) by mouth three times daily & 2 tablets (50 mg) at bedtime as needed: For sleep)  . risperiDONE (RISPERDAL) 3 MG tablet Take 1 tablet (3 mg total) by mouth at bedtime. For mood control  . sertraline (ZOLOFT) 50 MG tablet Take 50 mg by mouth daily.  . SUMAtriptan (IMITREX) 50 MG tablet Take 1 tablet earliest onset of migraine.  May repeat in 2 hours if headache persists or recurs.  Maximum 2 tablets in 24 hours. (Patient taking differently: Take 50 mg by mouth every 2 (two) hours as needed. May repeat in 2 hours if headache persists or recurs.  Maximum 2 tablets in 24 hours.)  . vitamin B-12 (CYANOCOBALAMIN) 1000 MCG tablet Take 2,000 mcg by mouth daily.   No facility-administered encounter medications on file as of 04/10/2019.    Allergies: Allergies  Allergen Reactions  . Bee Venom Anaphylaxis  . Coconut Flavor Anaphylaxis and Rash  . Geodon [Ziprasidone Hcl] Other (See Comments)    Pt states that this medication causes paralysis of the mouth.    . Haloperidol And Related Other (See Comments)    Pt states that this medication causes paralysis of the  mouth.    . Lithium Other (See Comments)    Reaction:  Seizure-like activity.    . Oxycodone Other (See Comments)    Reaction:  Hallucinations   . Seroquel [Quetiapine Fumarate] Other (See Comments)    Pt states that this medication is too strong.    . Shellfish Allergy Anaphylaxis  . Phenergan [Promethazine Hcl] Other (See Comments)    Reaction:  Chest pain    . Prilosec [Omeprazole] Nausea And Vomiting  . Sulfa Antibiotics Other (See Comments)    Chest pain   . Tegretol [Carbamazepine] Nausea And Vomiting    Family History: Family History  Problem Relation Age of Onset  . Mental illness Father   . Asthma Father   . PDD Brother   . Seizures Brother     Social History: Social History   Socioeconomic History  . Marital status: Widowed    Spouse name: Not on file  . Number of children: 0  . Years of education: Not on file  . Highest education level: Not on file  Occupational History  . Occupation: disability  Tobacco Use  . Smoking status: Former Smoker    Packs/day: 0.00  . Smokeless tobacco: Never Used  . Tobacco comment: Smoked for 2  years age [age]  Substance and Sexual Activity  . Alcohol use: No    Alcohol/week: 1.0 standard drinks    Types: 1 Standard drinks or equivalent per week  . Drug use: No    Comment: History of cocaine use at age 42 for 4 months  . Sexual activity: Yes    Birth control/protection: None  Other Topics Concern  . Not on file  Social History Narrative   Marital status: married; guardian is Furniture conservator/restorer      Children: none      Lives: with boyfriend, in two story home      Employment:  Disability      Tobacco: quit smoking; smoked for two years.      Alcohol ;none      Drugs: none   Has not traveled outside of the country.   Right handed         Social Determinants of Health   Financial Resource Strain:   . Difficulty of Paying Living Expenses: Not on file  Food Insecurity:   . Worried About Charity fundraiser in the Last Year: Not on file  . Ran Out of Food in the Last Year: Not on file  Transportation Needs:   . Lack of Transportation (Medical): Not on file  . Lack of Transportation (Non-Medical): Not on file  Physical Activity:   . Days of Exercise per Week: Not on  file  . Minutes of Exercise per Session: Not on file  Stress:   . Feeling of Stress : Not on file  Social Connections:   . Frequency of Communication with Friends and Family: Not on file  . Frequency of Social Gatherings with Friends and Family: Not on file  . Attends Religious Services: Not on file  . Active Member of Clubs or Organizations: Not on file  . Attends Archivist Meetings: Not on file  . Marital Status: Not on file  Intimate Partner Violence:   . Fear of Current or Ex-Partner: Not on file  . Emotionally Abused: Not on file  . Physically Abused: Not on file  . Sexually Abused: Not on file    Observations/Objective:   Height 5\' 5"  (1.651 m). No acute distress.  Alert and oriented.  Speech fluent and not dysarthric.  Language intact.  Eyes orthophoric on primary gaze.  Face symmetric.  Assessment and Plan:   1.  migraines without aura, without status migrainosus, not intractable.   2.  Seizure disorder.  Last seizure was December 2017.  EEG from December 2020 normal.  Off antiepileptic therapy.  Continue to monitor.  1.  For preventative management, Botox.  If she is pregnant, Botox remains an overall safe option for migraine management.  I advised that she discuss with her OBGYN to see if he has any objections 2.  For abortive therapy, would recommend only Tylenol for now; hold sumatriptan 50mg  unless it is confirmed that she is not pregnant. 3.  Limit use of pain relievers to no more than 2 days out of week to prevent risk of rebound or medication-overuse headache. 4.  Keep headache diary 5.  Exercise, hydration, caffeine cessation, sleep hygiene, monitor for and avoid triggers 6. Follow up for Botox next month.   Follow Up Instructions:    -I discussed the assessment and treatment plan with the patient. The patient was provided an opportunity to ask questions and all were answered. The patient agreed with the plan and demonstrated an understanding of the  instructions.   The patient was advised to call back or seek an in-person evaluation if the symptoms worsen or if the condition fails to improve as anticipated.   Dudley Major, DO

## 2019-04-10 ENCOUNTER — Encounter: Payer: Self-pay | Admitting: Neurology

## 2019-04-10 ENCOUNTER — Telehealth (INDEPENDENT_AMBULATORY_CARE_PROVIDER_SITE_OTHER): Payer: PPO | Admitting: Neurology

## 2019-04-10 ENCOUNTER — Other Ambulatory Visit: Payer: Self-pay

## 2019-04-10 VITALS — Ht 65.0 in

## 2019-04-10 DIAGNOSIS — G43009 Migraine without aura, not intractable, without status migrainosus: Secondary | ICD-10-CM

## 2019-04-10 DIAGNOSIS — G40909 Epilepsy, unspecified, not intractable, without status epilepticus: Secondary | ICD-10-CM

## 2019-04-10 DIAGNOSIS — F250 Schizoaffective disorder, bipolar type: Secondary | ICD-10-CM | POA: Diagnosis not present

## 2019-04-14 DIAGNOSIS — F250 Schizoaffective disorder, bipolar type: Secondary | ICD-10-CM | POA: Diagnosis not present

## 2019-04-30 ENCOUNTER — Ambulatory Visit: Payer: PPO | Attending: Internal Medicine

## 2019-04-30 DIAGNOSIS — Z23 Encounter for immunization: Secondary | ICD-10-CM

## 2019-04-30 NOTE — Progress Notes (Signed)
   Covid-19 Vaccination Clinic  Name:  [name]    MRN: [id number] DOB: [birth year]  04/30/2019  [name] was observed post Covid-19 immunization for 15 minutes without incident. She was provided with Vaccine Information Sheet and instruction to access the V-Safe system.   [name] was instructed to call 911 with any severe reactions post vaccine: Marland Kitchen Difficulty breathing  . Swelling of face and throat  . A fast heartbeat  . A bad rash all over body  . Dizziness and weakness   Immunizations Administered    Name Date Dose VIS Date Route   Pfizer COVID-19 Vaccine 04/30/2019 10:39 AM 0.3 mL 01/16/2019 Intramuscular   Manufacturer: [place]   Lot: [id number]   Pillager: [id number]

## 2019-05-07 DIAGNOSIS — G4733 Obstructive sleep apnea (adult) (pediatric): Secondary | ICD-10-CM | POA: Diagnosis not present

## 2019-05-15 ENCOUNTER — Other Ambulatory Visit: Payer: Self-pay

## 2019-05-15 ENCOUNTER — Ambulatory Visit (INDEPENDENT_AMBULATORY_CARE_PROVIDER_SITE_OTHER): Payer: PPO | Admitting: Neurology

## 2019-05-15 DIAGNOSIS — G43009 Migraine without aura, not intractable, without status migrainosus: Secondary | ICD-10-CM | POA: Diagnosis not present

## 2019-05-15 MED ORDER — ONABOTULINUMTOXINA 100 UNITS IJ SOLR
100.0000 [IU] | Freq: Once | INTRAMUSCULAR | Status: AC
  Administered 2019-05-15: 13:00:00 55 [IU] via INTRAMUSCULAR

## 2019-05-15 MED ORDER — ONABOTULINUMTOXINA 100 UNITS IJ SOLR
100.0000 [IU] | Freq: Once | INTRAMUSCULAR | Status: AC
  Administered 2019-05-15: 13:00:00 100 [IU] via INTRAMUSCULAR

## 2019-05-15 NOTE — Progress Notes (Signed)
Botulinum Clinic   Procedure Note Botox  Attending: Dr. Jarmarcus Wambold  Preoperative Diagnosis(es): Chronic migraine  Consent obtained from: The patient Benefits discussed included, but were not limited to decreased muscle tightness, increased joint range of motion, and decreased pain.  Risk discussed included, but were not limited pain and discomfort, bleeding, bruising, excessive weakness, venous thrombosis, muscle atrophy and dysphagia.  Anticipated outcomes of the procedure as well as he risks and benefits of the alternatives to the procedure, and the roles and tasks of the personnel to be involved, were discussed with the patient, and the patient consents to the procedure and agrees to proceed. A copy of the patient medication guide was given to the patient which explains the blackbox warning.  Patients identity and treatment sites confirmed Yes.  .  Details of Procedure: Skin was cleaned with alcohol. Prior to injection, the needle plunger was aspirated to make sure the needle was not within a blood vessel.  There was no blood retrieved on aspiration.    Following is a summary of the muscles injected  And the amount of Botulinum toxin used:  Dilution 200 units of Botox was reconstituted with 4 ml of preservative free normal saline. Time of reconstitution: At the time of the office visit (<30 minutes prior to injection)   Injections  155 total units of Botox was injected with a 30 gauge needle.  Injection Sites: L occipitalis: 15 units- 3 sites  R occiptalis: 15 units- 3 sites  L upper trapezius: 15 units- 3 sites R upper trapezius: 15 units- 3 sits          L paraspinal: 10 units- 2 sites R paraspinal: 10 units- 2 sites  Face L frontalis(2 injection sites):10 units   R frontalis(2 injection sites):10 units         L corrugator: 5 units   R corrugator: 5 units           Procerus: 5 units   L temporalis: 20 units R temporalis: 20 units   Agent:  200 units of botulinum Type  A (Onobotulinum Toxin type A) was reconstituted with 4 ml of preservative free normal saline.  Time of reconstitution: At the time of the office visit (<30 minutes prior to injection)     Total injected (Units):  155  Total wasted (Units):  10  Patient tolerated procedure well without complications.   Reinjection is anticipated in 3 months.   

## 2019-05-18 DIAGNOSIS — F250 Schizoaffective disorder, bipolar type: Secondary | ICD-10-CM | POA: Diagnosis not present

## 2019-05-25 ENCOUNTER — Ambulatory Visit: Payer: PPO | Attending: Internal Medicine

## 2019-05-25 DIAGNOSIS — Z23 Encounter for immunization: Secondary | ICD-10-CM

## 2019-05-25 NOTE — Progress Notes (Signed)
   Covid-19 Vaccination Clinic  Name:  [name]    MRN: [id number] DOB: [birth year]  05/25/2019  [name] was observed post Covid-19 immunization for 15 minutes without incident. She was provided with Vaccine Information Sheet and instruction to access the V-Safe system.   [name] was instructed to call 911 with any severe reactions post vaccine: Marland Kitchen Difficulty breathing  . Swelling of face and throat  . A fast heartbeat  . A bad rash all over body  . Dizziness and weakness   Immunizations Administered    Name Date Dose VIS Date Route   Pfizer COVID-19 Vaccine 05/25/2019  9:54 AM 0.3 mL 04/01/2018 Intramuscular   Manufacturer: [place]   Lot: [id number]   Brenda: [id number]

## 2019-05-26 DIAGNOSIS — G4733 Obstructive sleep apnea (adult) (pediatric): Secondary | ICD-10-CM | POA: Diagnosis not present

## 2019-06-01 DIAGNOSIS — G4733 Obstructive sleep apnea (adult) (pediatric): Secondary | ICD-10-CM | POA: Diagnosis not present

## 2019-06-05 DIAGNOSIS — S86911A Strain of unspecified muscle(s) and tendon(s) at lower leg level, right leg, initial encounter: Secondary | ICD-10-CM | POA: Diagnosis not present

## 2019-06-08 ENCOUNTER — Telehealth: Payer: Self-pay | Admitting: Neurology

## 2019-06-08 ENCOUNTER — Other Ambulatory Visit: Payer: Self-pay | Admitting: Neurology

## 2019-06-08 MED ORDER — SUMATRIPTAN SUCCINATE 50 MG PO TABS: ORAL_TABLET | ORAL | 3 refills | Status: DC

## 2019-06-08 NOTE — Telephone Encounter (Signed)
Prescription for sumatriptan 50mg  sent.

## 2019-06-08 NOTE — Telephone Encounter (Signed)
Patient requesting a prescription for Imitrex to the pharmacy below. She said she is not pregnant and is okay to take this medication now.  [employer]

## 2019-06-16 ENCOUNTER — Encounter (HOSPITAL_COMMUNITY): Payer: Self-pay

## 2019-06-16 ENCOUNTER — Emergency Department (HOSPITAL_COMMUNITY)
Admission: EM | Admit: 2019-06-16 | Discharge: 2019-06-16 | Disposition: A | Discharge status: Home / Self Care | Payer: PPO | Attending: Emergency Medicine | Admitting: Emergency Medicine

## 2019-06-16 ENCOUNTER — Other Ambulatory Visit: Payer: Self-pay

## 2019-06-16 ENCOUNTER — Emergency Department (HOSPITAL_COMMUNITY): Payer: PPO

## 2019-06-16 DIAGNOSIS — R1010 Upper abdominal pain, unspecified: Secondary | ICD-10-CM | POA: Diagnosis not present

## 2019-06-16 DIAGNOSIS — Z87891 Personal history of nicotine dependence: Secondary | ICD-10-CM | POA: Diagnosis not present

## 2019-06-16 DIAGNOSIS — R1013 Epigastric pain: Secondary | ICD-10-CM | POA: Diagnosis not present

## 2019-06-16 DIAGNOSIS — F840 Autistic disorder: Secondary | ICD-10-CM | POA: Diagnosis not present

## 2019-06-16 DIAGNOSIS — F1110 Opioid abuse, uncomplicated: Secondary | ICD-10-CM | POA: Diagnosis not present

## 2019-06-16 DIAGNOSIS — J45909 Unspecified asthma, uncomplicated: Secondary | ICD-10-CM | POA: Insufficient documentation

## 2019-06-16 DIAGNOSIS — Z79899 Other long term (current) drug therapy: Secondary | ICD-10-CM | POA: Diagnosis not present

## 2019-06-16 DIAGNOSIS — K7689 Other specified diseases of liver: Secondary | ICD-10-CM | POA: Diagnosis not present

## 2019-06-16 DIAGNOSIS — R1084 Generalized abdominal pain: Secondary | ICD-10-CM | POA: Diagnosis not present

## 2019-06-16 DIAGNOSIS — R112 Nausea with vomiting, unspecified: Secondary | ICD-10-CM | POA: Insufficient documentation

## 2019-06-16 DIAGNOSIS — R52 Pain, unspecified: Secondary | ICD-10-CM | POA: Diagnosis not present

## 2019-06-16 LAB — COMPREHENSIVE METABOLIC PANEL
ALT: 19 U/L (ref 0–44)
AST: 21 U/L (ref 15–41)
Albumin: 4.5 g/dL (ref 3.5–5.0)
Alkaline Phosphatase: 76 U/L (ref 38–126)
Anion gap: 10 (ref 5–15)
BUN: 9 mg/dL (ref 6–20)
CO2: 22 mmol/L (ref 22–32)
Calcium: 9.4 mg/dL (ref 8.9–10.3)
Chloride: 105 mmol/L (ref 98–111)
Creatinine, Ser: 0.64 mg/dL (ref 0.44–1.00)
GFR calc Af Amer: >60 mL/min (ref >60)
GFR calc non Af Amer: >60 mL/min (ref >60)
Glucose, Bld: 108 mg/dL — ABNORMAL HIGH (ref 70–99)
Potassium: 4 mmol/L (ref 3.5–5.1)
Sodium: 137 mmol/L (ref 135–145)
Total Bilirubin: 0.2 mg/dL — ABNORMAL LOW (ref 0.3–1.2)
Total Protein: 7.7 g/dL (ref 6.5–8.1)

## 2019-06-16 LAB — CBC
HCT: 39.4 % (ref 36.0–46.0)
Hemoglobin: 12.6 g/dL (ref 12.0–15.0)
MCH: 26.8 pg (ref 26.0–34.0)
MCHC: 32 g/dL (ref 30.0–36.0)
MCV: 83.7 fL (ref 80.0–100.0)
Platelets: 271 10*3/uL (ref 150–400)
RBC: 4.71 MIL/uL (ref 3.87–5.11)
RDW: 13.2 % (ref 11.5–15.5)
WBC: 11.1 10*3/uL — ABNORMAL HIGH (ref 4.0–10.5)
nRBC: 0 % (ref 0.0–0.2)

## 2019-06-16 LAB — I-STAT BETA HCG BLOOD, ED (MC, WL, AP ONLY): I-stat hCG, quantitative: <5 m[IU]/mL (ref <5)

## 2019-06-16 LAB — RAPID URINE DRUG SCREEN, HOSP PERFORMED
Amphetamines: NOT DETECTED
Barbiturates: NOT DETECTED
Benzodiazepines: NOT DETECTED
Cocaine: NOT DETECTED
Opiates: POSITIVE — AB
Tetrahydrocannabinol: NOT DETECTED

## 2019-06-16 LAB — URINALYSIS, ROUTINE W REFLEX MICROSCOPIC
Bilirubin Urine: NEGATIVE
Glucose, UA: NEGATIVE mg/dL
Hgb urine dipstick: NEGATIVE
Ketones, ur: NEGATIVE mg/dL
Leukocytes,Ua: NEGATIVE
Nitrite: NEGATIVE
Protein, ur: NEGATIVE mg/dL
Specific Gravity, Urine: 1.016 (ref 1.005–1.030)
pH: 5 (ref 5.0–8.0)

## 2019-06-16 LAB — LIPASE, BLOOD: Lipase: 44 U/L (ref 11–51)

## 2019-06-16 MED ORDER — ONDANSETRON 8 MG PO TBDP: 8.0000 mg | ORAL_TABLET | Freq: Three times a day (TID) | ORAL | 0 refills | Status: DC | PRN

## 2019-06-16 MED ORDER — ACETAMINOPHEN 500 MG PO TABS
1000.0000 mg | ORAL_TABLET | Freq: Once | ORAL | Status: AC
  Administered 2019-06-16: 11:00:00 1000 mg via ORAL
  Filled 2019-06-16: qty 2

## 2019-06-16 MED ORDER — ONDANSETRON HCL 4 MG/2ML IJ SOLN
4.0000 mg | Freq: Once | INTRAMUSCULAR | Status: AC
  Administered 2019-06-16: 09:00:00 4 mg via INTRAVENOUS
  Filled 2019-06-16: qty 2

## 2019-06-16 MED ORDER — HYDROMORPHONE HCL 1 MG/ML IJ SOLN
0.5000 mg | Freq: Once | INTRAMUSCULAR | Status: AC
  Administered 2019-06-16: 0.5 mg via INTRAVENOUS
  Filled 2019-06-16: qty 1

## 2019-06-16 MED ORDER — ALUM & MAG HYDROXIDE-SIMETH 200-200-20 MG/5ML PO SUSP
30.0000 mL | Freq: Once | ORAL | Status: AC
  Administered 2019-06-16: 11:00:00 30 mL via ORAL
  Filled 2019-06-16: qty 30

## 2019-06-16 MED ORDER — FAMOTIDINE IN NACL 20-0.9 MG/50ML-% IV SOLN
20.0000 mg | Freq: Once | INTRAVENOUS | Status: AC
  Administered 2019-06-16: 20 mg via INTRAVENOUS
  Filled 2019-06-16: qty 50

## 2019-06-16 MED ORDER — LORAZEPAM 2 MG/ML IJ SOLN
1.0000 mg | Freq: Once | INTRAMUSCULAR | Status: AC
  Administered 2019-06-16: 11:00:00 1 mg via INTRAVENOUS
  Filled 2019-06-16: qty 1

## 2019-06-16 MED ORDER — SODIUM CHLORIDE 0.9 % IV BOLUS
1000.0000 mL | Freq: Once | INTRAVENOUS | Status: AC
  Administered 2019-06-16: 1000 mL via INTRAVENOUS

## 2019-06-16 MED ORDER — METOCLOPRAMIDE HCL 5 MG/ML IJ SOLN
10.0000 mg | Freq: Once | INTRAMUSCULAR | Status: AC
  Administered 2019-06-16: 10 mg via INTRAVENOUS
  Filled 2019-06-16: qty 2

## 2019-06-16 MED ORDER — FAMOTIDINE 20 MG PO TABS
20.0000 mg | ORAL_TABLET | Freq: Once | ORAL | Status: AC
  Administered 2019-06-16: 11:00:00 20 mg via ORAL
  Filled 2019-06-16: qty 1

## 2019-06-16 MED ORDER — PANTOPRAZOLE SODIUM 40 MG PO TBEC: 40.0000 mg | DELAYED_RELEASE_TABLET | Freq: Every day | ORAL | 0 refills | Status: DC

## 2019-06-16 MED ORDER — SODIUM CHLORIDE 0.9 % IV BOLUS
1000.0000 mL | Freq: Once | INTRAVENOUS | Status: AC
  Administered 2019-06-16: 10:00:00 1000 mL via INTRAVENOUS

## 2019-06-16 NOTE — ED Notes (Signed)
Patient aware that urine sample is needed at this time. Call bell within reach and patient instructed to call when she is able to give sample.

## 2019-06-16 NOTE — Discharge Instructions (Addendum)
It was our pleasure to provide your ER care today - we hope that you feel better.  Rest. Drink plenty of fluids.   Take zofran as need for nausea. Take protonix (acid blocker medication). You may also try pepcid or maalox as need.   Follow up with primary care doctor in the next 1-2 days if symptoms fail to improve/resolve.  Return to ER if worse, new symptoms, fevers, worsening or severe pain, persistent vomiting, or other concern.  You were given pain medication in the ER - no driving for the next 6 hours.

## 2019-06-16 NOTE — ED Provider Notes (Signed)
[facility] DEPT Provider Note   CSN: [id number] Arrival date & time: 06/16/19  [id number]     History Chief Complaint  Patient presents with  . Abdominal Pain    [name] is a 29 y.o. female.  Patient c/o upper abd/epigastric pain and nausea/vomiting in the past 2 days. Notes several episodes emesis, not bloody or bilious. Is having regular bms. No abd distension or constipation. Pain acute onset, dull, moderate, non radiating, without specific exacerbating or alleviating factors. No back or flank pain. No cp or sob. No cough or uri symptoms. No fever or chills. Denies hx pud. No hx gallstones.   The history is provided by the patient.  Abdominal Pain Associated symptoms: nausea and vomiting   Associated symptoms: no chest pain, no chills, no fever, no shortness of breath and no sore throat        Past Medical History:  Diagnosis Date  . Acid reflux   . Anxiety   . Asthma    last attack 03/13/15 or 03/14/15  . Autism   . 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 Chronic constipation   . Depression   . Drug-seeking behavior   . Essential tremor   . Headache   . Overdose of acetaminophen 07/2017   and other meds  . Personality disorder ([place])   . Schizo-affective psychosis ([place])   . Schizoaffective disorder, bipolar type ([place])   . Seizures [facility])    Last seizure December 2017  . Sleep apnea     Patient Active Problem List   Diagnosis Date Noted  . Discord with neighbors, lodgers and landlord 03/16/2019  . Bipolar I disorder, most recent episode (or current) manic ([place]) 03/05/2019  . Desire for pregnancy 07/03/2018  . Drug overdose 07/22/2017  . Overdose 07/22/2017  . Intentional acetaminophen overdose ([place])   . Metabolic acidosis   . DUB (dysfunctional uterine bleeding) 11/22/2016  . Hyperprolactinemia ([place]) 08/20/2016  . Tachycardia 01/13/2016  . 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  . Seizure disorder ([place]) 08/08/2015  .  Chronic migraine 07/27/2015  . Asthma 04/15/2015  . Schizoaffective disorder, bipolar type ([place]) 03/10/2014  . Borderline personality disorder ([place]) 03/10/2014  . PTSD (post-traumatic stress disorder) 03/10/2014  . Autism spectrum disorder 06/15/2013    Past Surgical History:  Procedure Laterality Date  . MOUTH SURGERY  2009 or 2010     OB History    Gravida  2   Para  1   Term  1   Preterm      AB  1   Living  1     SAB  1   TAB      Ectopic      Multiple  0   Live Births  1           Family History  Problem Relation Age of Onset  . Mental illness Father   . Asthma Father   . PDD Brother   . Seizures Brother     Social History   Tobacco Use  . Smoking status: Former Smoker    Packs/day: 0.00  . Smokeless tobacco: Never Used  . Tobacco comment: Smoked for 2  years age [age]  Substance Use Topics  . Alcohol use: No    Alcohol/week: 1.0 standard drinks    Types: 1 Standard drinks or equivalent per week  . Drug use: No    Comment: History of cocaine use at age 31 for 4 months  Home Medications Prior to Admission medications   Medication Sig Start Date End Date Taking? Authorizing Provider  albuterol (PROVENTIL HFA;VENTOLIN HFA) 108 (90 Base) MCG/ACT inhaler Inhale 1-2 puffs into the lungs every 6 (six) hours as needed for wheezing or shortness of breath.    [provider]  Capsaicin (MUSCLE RELIEF EX) Apply 1 application topically 2 (two) times daily as needed (pain).    [provider]  famotidine (PEPCID) 10 MG tablet Take 10 mg by mouth daily.    [provider]  fluticasone-salmeterol (ADVAIR HFA) 115-21 MCG/ACT inhaler Inhale 2 puffs into the lungs 2 (two) times daily. 08/03/15   Timmothy Euler, MD  hydrOXYzine (ATARAX/VISTARIL) 25 MG tablet Take 1 tablet (25 mg) by mouth three times daily & 2 tablets (50 mg) at bedtime as needed: For sleep Patient taking differently: Take 25-50 mg by mouth See admin  instructions. Take 1 tablet (25 mg) by mouth three times daily & 2 tablets (50 mg) at bedtime as needed: For sleep 03/07/19   Lindell Spar I, NP  risperiDONE (RISPERDAL) 3 MG tablet Take 1 tablet (3 mg total) by mouth at bedtime. For mood control 03/07/19   Lindell Spar I, NP  sertraline (ZOLOFT) 50 MG tablet Take 50 mg by mouth daily.    [provider]  SUMAtriptan (IMITREX) 50 MG tablet Take 1 tablet earliest onset of migraine.  May repeat in 2 hours if headache persists or recurs.  Maximum 2 tablets in 24 hours. 06/08/19   Pieter Partridge, DO  vitamin B-12 (CYANOCOBALAMIN) 1000 MCG tablet Take 2,000 mcg by mouth daily.    [provider]    Allergies    Bee venom, Coconut flavor, Geodon [ziprasidone hcl], Haloperidol and related, Lithium, Oxycodone, Seroquel [quetiapine fumarate], Shellfish allergy, Phenergan [promethazine hcl], Prilosec [omeprazole], Sulfa antibiotics, and Tegretol [carbamazepine]  Review of Systems   Review of Systems  Constitutional: Negative for chills and fever.  HENT: Negative for sore throat.   Eyes: Negative for redness.  Respiratory: Negative for shortness of breath.   Cardiovascular: Negative for chest pain.  Gastrointestinal: Positive for abdominal pain, nausea and vomiting.  Genitourinary: Negative for flank pain.  Musculoskeletal: Negative for back pain and neck pain.  Skin: Negative for rash.  Neurological: Negative for headaches.  Hematological: Does not bruise/bleed easily.  Psychiatric/Behavioral: Negative for confusion.    Physical Exam Updated Vital Signs BP 132/72   Pulse (!) 118   Temp 98.4 F (36.9 C) (Oral)   Resp 16   Ht 1.651 m (5\' 5" )   Wt 82 kg   SpO2 98%   BMI 30.08 kg/m   Physical Exam Vitals and nursing note reviewed.  Constitutional:      Appearance: Normal appearance. She is well-developed.  HENT:     Head: Atraumatic.     Nose: Nose normal.     Mouth/Throat:     Mouth: Mucous membranes are moist.  Eyes:       General: No scleral icterus.    Conjunctiva/sclera: Conjunctivae normal.  Neck:     Trachea: No tracheal deviation.  Cardiovascular:     Rate and Rhythm: Normal rate and regular rhythm.     Pulses: Normal pulses.     Heart sounds: Normal heart sounds. No murmur. No friction rub. No gallop.   Pulmonary:     Effort: Pulmonary effort is normal. No respiratory distress.     Breath sounds: Normal breath sounds.  Abdominal:     General: Bowel  sounds are normal. There is no distension.     Palpations: Abdomen is soft. There is no mass.     Tenderness: There is abdominal tenderness. There is no guarding or rebound.     Hernia: No hernia is present.     Comments: Epigastric tenderness.   Genitourinary:    Comments: No cva tenderness.  Musculoskeletal:        General: No swelling.     Cervical back: Normal range of motion and neck supple. No rigidity. No muscular tenderness.  Skin:    General: Skin is warm and dry.     Findings: No rash.  Neurological:     Mental Status: She is alert.     Comments: Alert, speech normal.   Psychiatric:        Mood and Affect: Mood normal.     ED Results / Procedures / Treatments   Labs (all labs ordered are listed, but only abnormal results are displayed) Results for orders placed or performed during the hospital encounter of 06/16/19  Lipase, blood  Result Value Ref Range   Lipase 44 11 - 51 U/L  Comprehensive metabolic panel  Result Value Ref Range   Sodium 137 135 - 145 mmol/L   Potassium 4.0 3.5 - 5.1 mmol/L   Chloride 105 98 - 111 mmol/L   CO2 22 22 - 32 mmol/L   Glucose, Bld 108 (H) 70 - 99 mg/dL   BUN 9 6 - 20 mg/dL   Creatinine, Ser 0.64 0.44 - 1.00 mg/dL   Calcium 9.4 8.9 - 10.3 mg/dL   Total Protein 7.7 6.5 - 8.1 g/dL   Albumin 4.5 3.5 - 5.0 g/dL   AST 21 15 - 41 U/L   ALT 19 0 - 44 U/L   Alkaline Phosphatase 76 38 - 126 U/L   Total Bilirubin 0.2 (L) 0.3 - 1.2 mg/dL   GFR calc non Af Amer >60 >60 mL/min   GFR calc Af Amer  >60 >60 mL/min   Anion gap 10 5 - 15  CBC  Result Value Ref Range   WBC 11.1 (H) 4.0 - 10.5 K/uL   RBC 4.71 3.87 - 5.11 MIL/uL   Hemoglobin 12.6 12.0 - 15.0 g/dL   HCT 39.4 36.0 - 46.0 %   MCV 83.7 80.0 - 100.0 fL   MCH 26.8 26.0 - 34.0 pg   MCHC 32.0 30.0 - 36.0 g/dL   RDW 13.2 11.5 - 15.5 %   Platelets 271 150 - 400 K/uL   nRBC 0.0 0.0 - 0.2 %  Urinalysis, Routine w reflex microscopic  Result Value Ref Range   Color, Urine YELLOW YELLOW   APPearance CLEAR CLEAR   Specific Gravity, Urine 1.016 1.005 - 1.030   pH 5.0 5.0 - 8.0   Glucose, UA NEGATIVE NEGATIVE mg/dL   Hgb urine dipstick NEGATIVE NEGATIVE   Bilirubin Urine NEGATIVE NEGATIVE   Ketones, ur NEGATIVE NEGATIVE mg/dL   Protein, ur NEGATIVE NEGATIVE mg/dL   Nitrite NEGATIVE NEGATIVE   Leukocytes,Ua NEGATIVE NEGATIVE  Rapid urine drug screen (hospital performed)  Result Value Ref Range   Opiates POSITIVE (A) NONE DETECTED   Cocaine NONE DETECTED NONE DETECTED   Benzodiazepines NONE DETECTED NONE DETECTED   Amphetamines NONE DETECTED NONE DETECTED   Tetrahydrocannabinol NONE DETECTED NONE DETECTED   Barbiturates NONE DETECTED NONE DETECTED  I-Stat beta hCG blood, ED  Result Value Ref Range   I-stat hCG, quantitative <5.0 <5 mIU/mL   Comment 3  EKG None  Radiology US Abdomen Limited  Result Date: 06/16/2019 CLINICAL DATA:  Nausea, vomiting, right upper quadrant pain EXAM: ULTRASOUND ABDOMEN LIMITED RIGHT UPPER QUADRANT COMPARISON:  CT 11/30/2018 FINDINGS: Gallbladder: No gallstones or wall thickening visualized. No sonographic Murphy sign noted by sonographer. Common bile duct: Diameter: Normal caliber, 3 mm. Liver: Slight increased echotexture suggests early fatty infiltration. No focal hepatic abnormality. Portal vein is patent on color Doppler imaging with normal direction of blood flow towards the liver. Other: None. IMPRESSION: Probable early fatty infiltration of the liver. No acute findings.  Electronically Signed   By: Rolm Baptise M.D.   On: 06/16/2019 13:36    Procedures Procedures (including critical care time)  Medications Ordered in ED Medications  sodium chloride 0.9 % bolus 1,000 mL (has no administration in time range)  famotidine (PEPCID) IVPB 20 mg premix (has no administration in time range)  ondansetron (ZOFRAN) injection 4 mg (has no administration in time range)  HYDROmorphone (DILAUDID) injection 0.5 mg (has no administration in time range)    ED Course  I have reviewed the triage vital signs and the nursing notes.  Pertinent labs & imaging results that were available during my care of the patient were reviewed by me and considered in my medical decision making (see chart for details).    MDM Rules/Calculators/A&P                      Iv ns bolus. zofran iv. Dilaudid iv. pepcid iv.   Reviewed nursing notes and prior charts for additional history.  Pt with prior imaging for eval abd pain, including u/s neg for gallstones, ct neg for acute process.  Initial labs reviewed/interpreted by me - lipase normal, lfts normal. u preg neg.   Additional fluids.   Trial of po.   Pt also given acetaminophen, pepcid and maalox for symptom relief.   Tolerating po fluids.  U/s reviewed/interpreted by me - no gallstones  Recheck abd soft nt.   Patient currently appears stable for d/c.      Final Clinical Impression(s) / ED Diagnoses Final diagnoses:  None    Rx / DC Orders ED Discharge Orders    None       Lajean Saver, MD 06/16/19 1407

## 2019-06-16 NOTE — ED Notes (Signed)
Lab called to verify that urine sample is in process.

## 2019-06-16 NOTE — ED Triage Notes (Signed)
Generalized abdominal pain. N/V. sts hx of pancreatitis

## 2019-06-19 DIAGNOSIS — G4733 Obstructive sleep apnea (adult) (pediatric): Secondary | ICD-10-CM | POA: Diagnosis not present

## 2019-06-23 DIAGNOSIS — F29 Unspecified psychosis not due to a substance or known physiological condition: Secondary | ICD-10-CM | POA: Diagnosis not present

## 2019-06-23 DIAGNOSIS — T887XXA Unspecified adverse effect of drug or medicament, initial encounter: Secondary | ICD-10-CM | POA: Diagnosis not present

## 2019-06-24 ENCOUNTER — Encounter (HOSPITAL_COMMUNITY): Payer: Self-pay | Admitting: Emergency Medicine

## 2019-06-24 ENCOUNTER — Other Ambulatory Visit: Payer: Self-pay

## 2019-06-24 ENCOUNTER — Emergency Department (HOSPITAL_COMMUNITY)
Admission: EM | Admit: 2019-06-24 | Discharge: 2019-06-24 | Disposition: A | Discharge status: Home / Self Care | Payer: PPO | Attending: Emergency Medicine | Admitting: Emergency Medicine

## 2019-06-24 DIAGNOSIS — Z20822 Contact with and (suspected) exposure to covid-19: Secondary | ICD-10-CM | POA: Diagnosis not present

## 2019-06-24 DIAGNOSIS — F840 Autistic disorder: Secondary | ICD-10-CM | POA: Insufficient documentation

## 2019-06-24 DIAGNOSIS — Y999 Unspecified external cause status: Secondary | ICD-10-CM | POA: Diagnosis not present

## 2019-06-24 DIAGNOSIS — Z23 Encounter for immunization: Secondary | ICD-10-CM | POA: Diagnosis not present

## 2019-06-24 DIAGNOSIS — Z03818 Encounter for observation for suspected exposure to other biological agents ruled out: Secondary | ICD-10-CM | POA: Diagnosis not present

## 2019-06-24 DIAGNOSIS — W268XXA Contact with other sharp object(s), not elsewhere classified, initial encounter: Secondary | ICD-10-CM | POA: Insufficient documentation

## 2019-06-24 DIAGNOSIS — F250 Schizoaffective disorder, bipolar type: Secondary | ICD-10-CM | POA: Diagnosis not present

## 2019-06-24 DIAGNOSIS — Z79899 Other long term (current) drug therapy: Secondary | ICD-10-CM | POA: Insufficient documentation

## 2019-06-24 DIAGNOSIS — R45851 Suicidal ideations: Secondary | ICD-10-CM | POA: Insufficient documentation

## 2019-06-24 DIAGNOSIS — Y9389 Activity, other specified: Secondary | ICD-10-CM | POA: Insufficient documentation

## 2019-06-24 DIAGNOSIS — F4310 Post-traumatic stress disorder, unspecified: Secondary | ICD-10-CM | POA: Diagnosis present

## 2019-06-24 DIAGNOSIS — S61512A Laceration without foreign body of left wrist, initial encounter: Secondary | ICD-10-CM | POA: Diagnosis not present

## 2019-06-24 DIAGNOSIS — R4588 Nonsuicidal self-harm: Secondary | ICD-10-CM | POA: Diagnosis present

## 2019-06-24 DIAGNOSIS — F603 Borderline personality disorder: Secondary | ICD-10-CM | POA: Diagnosis present

## 2019-06-24 DIAGNOSIS — Y9289 Other specified places as the place of occurrence of the external cause: Secondary | ICD-10-CM | POA: Diagnosis not present

## 2019-06-24 DIAGNOSIS — F845 Asperger's syndrome: Secondary | ICD-10-CM | POA: Diagnosis present

## 2019-06-24 DIAGNOSIS — S51812A Laceration without foreign body of left forearm, initial encounter: Secondary | ICD-10-CM | POA: Diagnosis not present

## 2019-06-24 DIAGNOSIS — Z7289 Other problems related to lifestyle: Secondary | ICD-10-CM

## 2019-06-24 LAB — COMPREHENSIVE METABOLIC PANEL
ALT: 12 U/L (ref 0–44)
AST: 15 U/L (ref 15–41)
Albumin: 4.2 g/dL (ref 3.5–5.0)
Alkaline Phosphatase: 74 U/L (ref 38–126)
Anion gap: 11 (ref 5–15)
BUN: 8 mg/dL (ref 6–20)
CO2: 23 mmol/L (ref 22–32)
Calcium: 9.4 mg/dL (ref 8.9–10.3)
Chloride: 105 mmol/L (ref 98–111)
Creatinine, Ser: 0.72 mg/dL (ref 0.44–1.00)
GFR calc Af Amer: >60 mL/min (ref >60)
GFR calc non Af Amer: >60 mL/min (ref >60)
Glucose, Bld: 98 mg/dL (ref 70–99)
Potassium: 3.9 mmol/L (ref 3.5–5.1)
Sodium: 139 mmol/L (ref 135–145)
Total Bilirubin: 0.6 mg/dL (ref 0.3–1.2)
Total Protein: 7.5 g/dL (ref 6.5–8.1)

## 2019-06-24 LAB — RAPID URINE DRUG SCREEN, HOSP PERFORMED
Amphetamines: NOT DETECTED
Barbiturates: NOT DETECTED
Benzodiazepines: NOT DETECTED
Cocaine: NOT DETECTED
Opiates: NOT DETECTED
Tetrahydrocannabinol: NOT DETECTED

## 2019-06-24 LAB — CBC
HCT: 38.7 % (ref 36.0–46.0)
Hemoglobin: 12.3 g/dL (ref 12.0–15.0)
MCH: 27 pg (ref 26.0–34.0)
MCHC: 31.8 g/dL (ref 30.0–36.0)
MCV: 85.1 fL (ref 80.0–100.0)
Platelets: 256 10*3/uL (ref 150–400)
RBC: 4.55 MIL/uL (ref 3.87–5.11)
RDW: 13.1 % (ref 11.5–15.5)
WBC: 10.8 10*3/uL — ABNORMAL HIGH (ref 4.0–10.5)
nRBC: 0 % (ref 0.0–0.2)

## 2019-06-24 LAB — ETHANOL: Alcohol, Ethyl (B): <10 mg/dL (ref <10)

## 2019-06-24 LAB — ACETAMINOPHEN LEVEL: Acetaminophen (Tylenol), Serum: <10 ug/mL — ABNORMAL LOW (ref 10–30)

## 2019-06-24 LAB — SALICYLATE LEVEL: Salicylate Lvl: <7 mg/dL — ABNORMAL LOW (ref 7.0–30.0)

## 2019-06-24 LAB — PREGNANCY, URINE: Preg Test, Ur: NEGATIVE

## 2019-06-24 LAB — SARS CORONAVIRUS 2 BY RT PCR (HOSPITAL ORDER, PERFORMED IN ~~LOC~~ HOSPITAL LAB): SARS Coronavirus 2: NEGATIVE

## 2019-06-24 MED ORDER — BACITRACIN ZINC 500 UNIT/GM EX OINT
TOPICAL_OINTMENT | Freq: Once | CUTANEOUS | Status: AC
  Filled 2019-06-24: qty 1.8

## 2019-06-24 MED ORDER — ONDANSETRON 8 MG PO TBDP: 8.0000 mg | ORAL_TABLET | Freq: Three times a day (TID) | ORAL | Status: DC | PRN

## 2019-06-24 MED ORDER — TETANUS-DIPHTH-ACELL PERTUSSIS 5-2.5-18.5 LF-MCG/0.5 IM SUSP
0.5000 mL | Freq: Once | INTRAMUSCULAR | Status: AC
  Administered 2019-06-24: 0.5 mL via INTRAMUSCULAR
  Filled 2019-06-24: qty 0.5

## 2019-06-24 MED ORDER — LORAZEPAM 0.5 MG PO TABS: 0.5000 mg | ORAL_TABLET | Freq: Once | ORAL | Status: DC

## 2019-06-24 MED ORDER — SERTRALINE HCL 50 MG PO TABS: 50.0000 mg | ORAL_TABLET | Freq: Every day | ORAL | Status: DC

## 2019-06-24 MED ORDER — RISPERIDONE 2 MG PO TABS: 3.0000 mg | ORAL_TABLET | Freq: Every day | ORAL | Status: DC

## 2019-06-24 MED ORDER — HYDROXYZINE HCL 25 MG PO TABS
25.0000 mg | ORAL_TABLET | Freq: Once | ORAL | Status: AC
  Administered 2019-06-24: 25 mg via ORAL
  Filled 2019-06-24: qty 1

## 2019-06-24 MED ORDER — FAMOTIDINE 20 MG PO TABS: 10.0000 mg | ORAL_TABLET | Freq: Every day | ORAL | Status: DC

## 2019-06-24 MED ORDER — HYDROXYZINE HCL 25 MG PO TABS: 25.0000 mg | ORAL_TABLET | ORAL | Status: DC

## 2019-06-24 MED ORDER — SUMATRIPTAN SUCCINATE 50 MG PO TABS
50.0000 mg | ORAL_TABLET | Freq: Once | ORAL | Status: AC
  Administered 2019-06-24: 50 mg via ORAL
  Filled 2019-06-24 (×2): qty 1

## 2019-06-24 MED ORDER — RISPERIDONE 2 MG PO TABS: 2.0000 mg | ORAL_TABLET | Freq: Every morning | ORAL | Status: DC

## 2019-06-24 MED ORDER — ALBUTEROL SULFATE HFA 108 (90 BASE) MCG/ACT IN AERS: 1.0000 | INHALATION_SPRAY | Freq: Four times a day (QID) | RESPIRATORY_TRACT | Status: DC | PRN

## 2019-06-24 MED ORDER — RISPERIDONE 2 MG PO TABS
3.0000 mg | ORAL_TABLET | Freq: Every day | ORAL | Status: DC
  Administered 2019-06-24: 3 mg via ORAL
  Filled 2019-06-24: qty 1

## 2019-06-24 NOTE — ED Notes (Signed)
Patient noted to be speaking to someone in an empty room. Patient noted to also be agitated and began banging around furniture in the room.

## 2019-06-24 NOTE — [facility] Counselor (Signed)
Per Nurse, pt unable to participate in TTS assessment at this time, pt to complete assessment once she wakes up.

## 2019-06-24 NOTE — ED Notes (Signed)
Patient's sitter made this RN aware that patient attempted to wrap blood pressure cuff around neck. Sitter reports that she was able to stop her. Kerin Perna, MD aware. Tom with TTS to tell provider at [facility].

## 2019-06-24 NOTE — ED Triage Notes (Signed)
Patient presents SI with cuts to left wrist. Patient states plan to OD on meds or slit wrists. Patient has multiple life stressors.

## 2019-06-24 NOTE — [facility] Counselor (Signed)
Per S. Rankin, NP, Pt is psych-cleared.

## 2019-06-24 NOTE — ED Provider Notes (Signed)
[facility] DEPT Provider Note   CSN: [id number] Arrival date & time: 06/24/19  0002     History Chief Complaint  Patient presents with  . Suicidal  . Laceration    [name] is a 29 y.o. female with past medical history significant for anxiety, autism, personality disorder, schizoaffective psychosis presenting to emergency department today with chief complaint of suicidal ideations and left wrist laceration.  Patient states she has had suicidal ideations for the last several days.  She has a lot of stressors going on her life right now including a custody case about her daughter.  She used a razor to cut her left arm tonight because an attempt to hurt herself.  She has no physical complaints at this time. Denies any drug use or alcohol consumption.  Denies homicidal ideations, visual or auditory hallucinations. She denies compliance with her home medications.    Past Medical History:  Diagnosis Date  . Acid reflux   . Anxiety   . Asthma    last attack 03/13/15 or 03/14/15  . Autism   . 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 Chronic constipation   . Depression   . Drug-seeking behavior   . Essential tremor   . Headache   . Overdose of acetaminophen 07/2017   and other meds  . Personality disorder ([place])   . Schizo-affective psychosis ([place])   . Schizoaffective disorder, bipolar type ([place])   . Seizures [facility])    Last seizure December 2017  . Sleep apnea     Patient Active Problem List   Diagnosis Date Noted  . Discord with neighbors, lodgers and landlord 03/16/2019  . Bipolar I disorder, most recent episode (or current) manic ([place]) 03/05/2019  . Desire for pregnancy 07/03/2018  . Drug overdose 07/22/2017  . Overdose 07/22/2017  . Intentional acetaminophen overdose ([place])   . Metabolic acidosis   . DUB (dysfunctional uterine bleeding) 11/22/2016  . Hyperprolactinemia ([place]) 08/20/2016  . Tachycardia 01/13/2016  . 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 09/08/2015  . Seizure disorder ([place]) 08/08/2015  . Chronic migraine 07/27/2015  . Asthma 04/15/2015  . Schizoaffective disorder, bipolar type ([place]) 03/10/2014  . Borderline personality disorder ([place]) 03/10/2014  . PTSD (post-traumatic stress disorder) 03/10/2014  . Autism spectrum disorder 06/15/2013    Past Surgical History:  Procedure Laterality Date  . MOUTH SURGERY  2009 or 2010     OB History    Gravida  2   Para  1   Term  1   Preterm      AB  1   Living  1     SAB  1   TAB      Ectopic      Multiple  0   Live Births  1           Family History  Problem Relation Age of Onset  . Mental illness Father   . Asthma Father   . PDD Brother   . Seizures Brother     Social History   Tobacco Use  . Smoking status: Former Smoker    Packs/day: 0.00  . Smokeless tobacco: Never Used  . Tobacco comment: Smoked for 2  years age [age]  Substance Use Topics  . Alcohol use: No    Alcohol/week: 1.0 standard drinks    Types: 1 Standard drinks or equivalent per week  . Drug use: No    Comment: History of cocaine use at age 66 for  4 months    Home Medications Prior to Admission medications   Medication Sig Start Date End Date Taking? Authorizing Provider  albuterol (PROVENTIL HFA;VENTOLIN HFA) 108 (90 Base) MCG/ACT inhaler Inhale 1-2 puffs into the lungs every 6 (six) hours as needed for wheezing or shortness of breath.   Yes [provider]  Capsaicin (MUSCLE RELIEF EX) Apply 1 application topically 2 (two) times daily as needed (pain).   Yes [provider]  famotidine (PEPCID) 10 MG tablet Take 10 mg by mouth daily.   Yes [provider]  fluticasone-salmeterol (ADVAIR HFA) 115-21 MCG/ACT inhaler Inhale 2 puffs into the lungs 2 (two) times daily. 08/03/15  Yes Timmothy Euler, MD  hydrOXYzine (ATARAX/VISTARIL) 25 MG tablet Take 1 tablet (25 mg) by mouth three times daily & 2 tablets (50 mg) at bedtime as needed: For  sleep Patient taking differently: Take 25-50 mg by mouth See admin instructions. Take 1 tablet (25 mg) by mouth three times daily & 2 tablets (50 mg) at bedtime as needed: For sleep 03/07/19  Yes Lindell Spar I, NP  risperiDONE (RISPERDAL) 2 MG tablet Take 2 mg by mouth in the morning. 04/24/19  Yes [provider]  risperiDONE (RISPERDAL) 3 MG tablet Take 1 tablet (3 mg total) by mouth at bedtime. For mood control 03/07/19  Yes Nwoko, Herbert Pun I, NP  sertraline (ZOLOFT) 50 MG tablet Take 50 mg by mouth daily.   Yes [provider]  SUMAtriptan (IMITREX) 50 MG tablet Take 1 tablet earliest onset of migraine.  May repeat in 2 hours if headache persists or recurs.  Maximum 2 tablets in 24 hours. 06/08/19  Yes Jaffe, Adam R, DO  vitamin B-12 (CYANOCOBALAMIN) 1000 MCG tablet Take 2,000 mcg by mouth daily.   Yes [provider]  ondansetron (ZOFRAN ODT) 8 MG disintegrating tablet Take 1 tablet (8 mg total) by mouth every 8 (eight) hours as needed for nausea or vomiting. 06/16/19   Lajean Saver, MD  pantoprazole (PROTONIX) 40 MG tablet Take 1 tablet (40 mg total) by mouth daily. 06/16/19   Lajean Saver, MD    Allergies    Bee venom, Coconut flavor, Geodon [ziprasidone hcl], Haloperidol and related, Lithium, Oxycodone, Seroquel [quetiapine fumarate], Shellfish allergy, Phenergan [promethazine hcl], Prilosec [omeprazole], Sulfa antibiotics, and Tegretol [carbamazepine]  Review of Systems   Review of Systems All other systems are reviewed and are negative for acute change except as noted in the HPI.  Physical Exam Updated Vital Signs BP 114/88 (BP Location: Right Arm)   Pulse 97   Temp 98.1 F (36.7 C) (Oral)   Resp 17   SpO2 98%   Physical Exam Vitals and nursing note reviewed.  Constitutional:      General: She is not in acute distress.    Appearance: She is not ill-appearing.  HENT:     Head: Normocephalic and atraumatic.     Right Ear: Tympanic membrane and external  ear normal.     Left Ear: Tympanic membrane and external ear normal.     Nose: Nose normal.     Mouth/Throat:     Mouth: Mucous membranes are moist.     Pharynx: Oropharynx is clear.  Eyes:     General: No scleral icterus.       Right eye: No discharge.        Left eye: No discharge.     Extraocular Movements: Extraocular movements intact.     Conjunctiva/sclera: Conjunctivae normal.     Pupils:  Pupils are equal, round, and reactive to light.  Neck:     Vascular: No JVD.  Cardiovascular:     Rate and Rhythm: Normal rate and regular rhythm.     Pulses: Normal pulses.          Radial pulses are 2+ on the right side and 2+ on the left side.     Heart sounds: Normal heart sounds.  Pulmonary:     Comments: Lungs clear to auscultation in all fields. Symmetric chest rise. No wheezing, rales, or rhonchi. Abdominal:     Comments: Abdomen is soft, non-distended, and non-tender in all quadrants. No rigidity, no guarding. No peritoneal signs.  Musculoskeletal:        General: Normal range of motion.     Cervical back: Normal range of motion.  Skin:    General: Skin is warm and dry.     Capillary Refill: Capillary refill takes less than 2 seconds.     Comments: Please see media below.  Superficial lacerations to left forearm.  No active bleeding.  Neurological:     Mental Status: She is oriented to person, place, and time.     GCS: GCS eye subscore is 4. GCS verbal subscore is 5. GCS motor subscore is 6.     Comments: Fluent speech, no facial droop.  Psychiatric:        Attention and Perception: She perceives auditory and visual hallucinations.        Mood and Affect: Mood is anxious.        Speech: Speech is rapid and pressured.        Behavior: Behavior normal.        Thought Content: Thought content includes suicidal ideation. Thought content does not include homicidal ideation. Thought content does not include homicidal or suicidal plan.       ED Results / Procedures / Treatments    Labs (all labs ordered are listed, but only abnormal results are displayed) Labs Reviewed  SALICYLATE LEVEL - Abnormal; Notable for the following components:      Result Value   Salicylate Lvl [id number] (*)    All other components within normal limits  ACETAMINOPHEN LEVEL - Abnormal; Notable for the following components:   Acetaminophen (Tylenol), Serum <10 (*)    All other components within normal limits  CBC - Abnormal; Notable for the following components:   WBC 10.8 (*)    All other components within normal limits  SARS CORONAVIRUS 2 BY RT PCR (HOSPITAL ORDER, [facility])  COMPREHENSIVE METABOLIC PANEL  ETHANOL  RAPID URINE DRUG SCREEN, HOSP PERFORMED  PREGNANCY, URINE    EKG None  Radiology No results found.  Procedures Procedures (including critical care time)  Medications Ordered in ED Medications  albuterol (VENTOLIN HFA) 108 (90 Base) MCG/ACT inhaler 1-2 puff (has no administration in time range)  famotidine (PEPCID) tablet 10 mg (has no administration in time range)  hydrOXYzine (ATARAX/VISTARIL) tablet 25-50 mg (has no administration in time range)  ondansetron (ZOFRAN-ODT) disintegrating tablet 8 mg (has no administration in time range)  risperiDONE (RISPERDAL) tablet 2 mg (has no administration in time range)  sertraline (ZOLOFT) tablet 50 mg (has no administration in time range)  LORazepam (ATIVAN) tablet 0.5 mg (0.5 mg Oral Refused 06/24/19 0404)  risperiDONE (RISPERDAL) tablet 3 mg (3 mg Oral Given 06/24/19 0501)  SUMAtriptan (IMITREX) tablet 50 mg (has no administration in time range)  Tdap (BOOSTRIX) injection 0.5 mL (0.5 mLs Intramuscular Given 06/24/19  0502)  bacitracin ointment ( Topical Given 06/24/19 0501)  hydrOXYzine (ATARAX/VISTARIL) tablet 25 mg (25 mg Oral Given 06/24/19 0501)    ED Course  I have reviewed the triage vital signs and the nursing notes.  Pertinent labs & imaging results that were available during my care of  the patient were reviewed by me and considered in my medical decision making (see chart for details).    MDM Rules/Calculators/A&P                      No medical complaints today.  Patient here for suicidal ideations.  She has multiple superficial lacerations on her left forearm caused by a dirty razor.  Tetanus updated.  Wound cleaned and irrigated, bacitracin and dressings applied. Wounds are not amendable to suture repair.  Labs ordered and reviewed. They are unremarkable. Patient is medically cleared for TTS evaluation, disposition per [facility].   Patient has been noted be be responding to internal stimuli while in exam room. Home medications and regular diet ordered.   Patient is here voluntarily. Will sign out to default provider pending TTS eval.    Portions of this note were generated with Dragon dictation software. Dictation errors may occur despite best attempts at proofreading.    Final Clinical Impression(s) / ED Diagnoses Final diagnoses:  Suicidal ideation  Deliberate self-cutting    Rx / DC Orders ED Discharge Orders    None       Cherre Robins, PA-C 06/24/19 [id number]    Ezequiel Essex, MD 06/24/19 [phone]

## 2019-06-24 NOTE — [facility] Suicide Risk Assessment (Cosign Needed Addendum)
Suicide Risk Assessment  Discharge Assessment   [facility] Discharge Suicide Risk Assessment   Principal Problem: Non-suicidal self harm Discharge Diagnoses: Principal Problem:   Non-suicidal self harm Active Problems:   Borderline personality disorder ([place])   Autism spectrum disorder   Suicidal ideations   Schizoaffective disorder, bipolar type ([place])   PTSD (post-traumatic stress disorder)   Total Time spent with patient: 30 minutes  Musculoskeletal: Strength & Muscle Tone: within normal limits Gait & Station: normal Patient leans: N/A  Psychiatric Specialty Exam:   Blood pressure (!) 118/54, pulse 74, temperature 98.1 F (36.7 C), temperature source Oral, resp. rate 15, SpO2 98 %.There is no height or weight on file to calculate BMI.  General Appearance: Casual  Eye Contact::  Good  Speech:  Blocked and Normal Rate409  Volume:  Normal  Mood:  "Okay"  Affect:  Appropriate and Congruent  Thought Process:  Coherent, Goal Directed and Descriptions of Associations: Intact  Orientation:  Full (Time, Place, and Person)  Thought Content:  WDL and Logical  Suicidal Thoughts:  No  Homicidal Thoughts:  No  Memory:  Immediate;   Good Recent;   Good  Judgement:  Intact  Insight:  Present  Psychomotor Activity:  Normal  Concentration:  Good  Recall:  Good  Fund of Knowledge:Good  Language: Good  Akathisia:  No  Handed:  Right  AIMS (if indicated):     Assets:  Communication Skills Desire for Improvement Housing Leisure Time Social Support  Sleep:     Cognition: WNL  ADL's:  Intact   Mental Status Per Nursing Assessment::   On Admission:    [name], 29 y.o., female patient seen via tele psych by this provider, Dr. Dwyane Dee; and chart reviewed on 06/24/19.  On evaluation Vernella Student reports that she has a history of self harm (cutting) and that she was not trying to kill herself but did make superficial cut on wrist and no stiches were needed. Patient  states that she lives with her boyfriend who is supportive.  Patient gave permission to speak with her boyfriend for collateral.  [name] at [phone]  During evaluation Lundon Brannum is alert/oriented x 4; calm/cooperative; and mood is congruent with affect.  She does not appear to be responding to internal/external stimuli or delusional thoughts.  Patient denies suicidal/self-harm/homicidal ideation, psychosis, and paranoia.  Patient answered question appropriately.   Spoke to [place] (patients boyfriend) States that patient does live with him and that he does not feel that the patient is suicidal.  States that the patient has a history of self harm (cutting) and does it when she is upset and at times for attention.  States that he and patient were talking about her getting custody of her children and that patient was saying that the mother in law was just trying to keep children because of the stimulus money.  "I asked her why would she even say a thing like that and she got mad."  Patient states "She talks about getting custody everyday and going to court.  I guess I had got tired of hearing the same thing everyday and told her I didn't even want to hear it."  Reports that patient will be safe to come home.  Patient has outpatient psychiatric services and takes her medication.    Demographic Factors:  Caucasian  Loss Factors: Attempting to get custody of her children from mother in law  Historical Factors: Impulsivity  Risk Reduction Factors:   Sense of  responsibility to family, Religious beliefs about death, Living with another person, especially a relative, Positive social support and Positive therapeutic relationship  Continued Clinical Symptoms:  Previous Psychiatric Diagnoses and Treatments  Cognitive Features That Contribute To Risk:  None    Suicide Risk:  Minimal: No identifiable suicidal ideation.  Patients presenting with no risk factors but with morbid ruminations; may  be classified as minimal risk based on the severity of the depressive symptoms    Plan Of Care/Follow-up recommendations:  Activity:  As tolerated Diet:  Heart healthy     Discharge Instructions     For your mental health needs, you are advised to continue treatment with [facility]:       [facility]      [address]      [place], [place] [address]      [phone]      Crisis number: [phone]  If for any reason you are unable to follow up with [facility], contact [facility]:       [facility]      [place],  [address]      [phone]      New patients are seen at their walk-in clinic.  Walk-in hours are Monday - Friday from 8:30 am - 12:00 pm, and from 1:00 pm - 2:30 pm.  Walk-in patients are seen on a first come, first served basis, so try to arrive as early as possible for the best chance of being seen the same day.    Disposition:  Psychiatrically cleared No evidence of imminent risk to self or others at present.   Patient does not meet criteria for psychiatric inpatient admission. Supportive therapy provided about ongoing stressors. Refer to IOP. Discussed crisis plan, support from social network, calling 911, coming to the Emergency Department, and calling Suicide Hotline.  Shyanne Mcclary, NP 06/24/2019, 12:06 PM
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
## 2019-06-24 NOTE — BH Assessment (Addendum)
[facility] Assessment Progress Note  Per Shuvon Rankin, FNP, this pt does not require psychiatric hospitalization at this time.  Pt is psychiatrically cleared.  Discharge instructions advise pt to continue treatment with [facility], but also include referral information for [facility] as an alternative.  Pt's nurse has been notified.  [name], MA Triage Specialist [phone]   Addendum:  Pt's nurse, Morey Hummingbird, calls to report pt's behavior in the ED, including putting blood pressure cuff around her neck.  This was staffed with [place].  She reports that she has spoken to pt's boyfriend to obtain collateral information.  The boyfriend reports that pt engages in self-injurious and attention seeking behavior at baseline.  Pt is to be discharged from [facility] with instructions noted above.  Pt's nurse has been notified.  [name], [place] Coordinator [phone]

## 2019-06-24 NOTE — ED Notes (Signed)
TTS computer set up in room

## 2019-06-24 NOTE — ED Notes (Signed)
Pt belongings collected and placed behind nurses station. Pt has one pt belongings bag that contain patients clothes and purse. Pt also has a black suitcase. Both bag and suitcase are labeled with patient sticker

## 2019-06-24 NOTE — Discharge Instructions (Signed)
For your mental health needs, you are advised to continue treatment with [facility]:       [facility]      [address]      [place], [place] [address]      [phone]      Crisis number: [phone]  If for any reason you are unable to follow up with [facility], contact [facility]:       [facility]      [place], [place] [address]      [phone]      New patients are seen at their walk-in clinic.  Walk-in hours are Monday - Friday from 8:30 am - 12:00 pm, and from 1:00 pm - 2:30 pm.  Walk-in patients are seen on a first come, first served basis, so try to arrive as early as possible for the best chance of being seen the same day.

## 2019-06-30 ENCOUNTER — Encounter (HOSPITAL_COMMUNITY): Payer: Self-pay | Admitting: Emergency Medicine

## 2019-06-30 ENCOUNTER — Emergency Department (HOSPITAL_COMMUNITY)
Admission: EM | Admit: 2019-06-30 | Discharge: 2019-07-01 | Disposition: A | Discharge status: Other Acute Inpatient Hospital | Payer: PPO | Attending: Emergency Medicine | Admitting: Emergency Medicine

## 2019-06-30 DIAGNOSIS — Z03818 Encounter for observation for suspected exposure to other biological agents ruled out: Secondary | ICD-10-CM | POA: Diagnosis not present

## 2019-06-30 DIAGNOSIS — Z20822 Contact with and (suspected) exposure to covid-19: Secondary | ICD-10-CM | POA: Insufficient documentation

## 2019-06-30 DIAGNOSIS — J45909 Unspecified asthma, uncomplicated: Secondary | ICD-10-CM | POA: Diagnosis not present

## 2019-06-30 DIAGNOSIS — F840 Autistic disorder: Secondary | ICD-10-CM | POA: Diagnosis not present

## 2019-06-30 DIAGNOSIS — F250 Schizoaffective disorder, bipolar type: Secondary | ICD-10-CM | POA: Insufficient documentation

## 2019-06-30 DIAGNOSIS — F3110 Bipolar disorder, current episode manic without psychotic features, unspecified: Secondary | ICD-10-CM

## 2019-06-30 DIAGNOSIS — K219 Gastro-esophageal reflux disease without esophagitis: Secondary | ICD-10-CM | POA: Diagnosis not present

## 2019-06-30 DIAGNOSIS — Z79899 Other long term (current) drug therapy: Secondary | ICD-10-CM | POA: Insufficient documentation

## 2019-06-30 DIAGNOSIS — R45851 Suicidal ideations: Secondary | ICD-10-CM | POA: Insufficient documentation

## 2019-06-30 LAB — COMPREHENSIVE METABOLIC PANEL
ALT: 13 U/L (ref 0–44)
AST: 18 U/L (ref 15–41)
Albumin: 4.9 g/dL (ref 3.5–5.0)
Alkaline Phosphatase: 83 U/L (ref 38–126)
Anion gap: 11 (ref 5–15)
BUN: 7 mg/dL (ref 6–20)
CO2: 23 mmol/L (ref 22–32)
Calcium: 9.3 mg/dL (ref 8.9–10.3)
Chloride: 104 mmol/L (ref 98–111)
Creatinine, Ser: 0.67 mg/dL (ref 0.44–1.00)
GFR calc Af Amer: >60 mL/min (ref >60)
GFR calc non Af Amer: >60 mL/min (ref >60)
Glucose, Bld: 112 mg/dL — ABNORMAL HIGH (ref 70–99)
Potassium: 3.7 mmol/L (ref 3.5–5.1)
Sodium: 138 mmol/L (ref 135–145)
Total Bilirubin: 0.3 mg/dL (ref 0.3–1.2)
Total Protein: 7.9 g/dL (ref 6.5–8.1)

## 2019-06-30 LAB — CBC
HCT: 40.4 % (ref 36.0–46.0)
Hemoglobin: 13.2 g/dL (ref 12.0–15.0)
MCH: 27.5 pg (ref 26.0–34.0)
MCHC: 32.7 g/dL (ref 30.0–36.0)
MCV: 84.2 fL (ref 80.0–100.0)
Platelets: 260 10*3/uL (ref 150–400)
RBC: 4.8 MIL/uL (ref 3.87–5.11)
RDW: 12.9 % (ref 11.5–15.5)
WBC: 9.6 10*3/uL (ref 4.0–10.5)
nRBC: 0 % (ref 0.0–0.2)

## 2019-06-30 LAB — I-STAT BETA HCG BLOOD, ED (MC, WL, AP ONLY): I-stat hCG, quantitative: <5 m[IU]/mL (ref <5)

## 2019-06-30 LAB — SARS CORONAVIRUS 2 BY RT PCR (HOSPITAL ORDER, PERFORMED IN ~~LOC~~ HOSPITAL LAB): SARS Coronavirus 2: NEGATIVE

## 2019-06-30 LAB — ETHANOL: Alcohol, Ethyl (B): <10 mg/dL (ref <10)

## 2019-06-30 LAB — SALICYLATE LEVEL: Salicylate Lvl: <7 mg/dL — ABNORMAL LOW (ref 7.0–30.0)

## 2019-06-30 LAB — ACETAMINOPHEN LEVEL: Acetaminophen (Tylenol), Serum: <10 ug/mL — ABNORMAL LOW (ref 10–30)

## 2019-06-30 MED ORDER — MOMETASONE FURO-FORMOTEROL FUM 200-5 MCG/ACT IN AERO
2.0000 | INHALATION_SPRAY | Freq: Two times a day (BID) | RESPIRATORY_TRACT | Status: DC
  Administered 2019-07-01: 2 via RESPIRATORY_TRACT
  Filled 2019-06-30: qty 8.8

## 2019-06-30 MED ORDER — SERTRALINE HCL 50 MG PO TABS
50.0000 mg | ORAL_TABLET | Freq: Every day | ORAL | Status: DC
  Administered 2019-07-01: 50 mg via ORAL
  Filled 2019-06-30 (×2): qty 1

## 2019-06-30 MED ORDER — RISPERIDONE 2 MG PO TABS
2.0000 mg | ORAL_TABLET | Freq: Every morning | ORAL | Status: DC
  Administered 2019-07-01: 2 mg via ORAL
  Filled 2019-06-30 (×2): qty 1

## 2019-06-30 MED ORDER — FAMOTIDINE 20 MG PO TABS
10.0000 mg | ORAL_TABLET | Freq: Every day | ORAL | Status: DC
  Administered 2019-06-30 – 2019-07-01 (×2): 10 mg via ORAL
  Filled 2019-06-30 (×2): qty 1

## 2019-06-30 MED ORDER — PANTOPRAZOLE SODIUM 40 MG PO TBEC
40.0000 mg | DELAYED_RELEASE_TABLET | Freq: Every day | ORAL | Status: DC
  Administered 2019-06-30 – 2019-07-01 (×2): 40 mg via ORAL
  Filled 2019-06-30 (×2): qty 1

## 2019-06-30 MED ORDER — HYDROXYZINE HCL 25 MG PO TABS
25.0000 mg | ORAL_TABLET | ORAL | Status: DC
  Administered 2019-06-30: 25 mg via ORAL
  Filled 2019-06-30: qty 1

## 2019-06-30 MED ORDER — ALBUTEROL SULFATE HFA 108 (90 BASE) MCG/ACT IN AERS: 1.0000 | INHALATION_SPRAY | Freq: Four times a day (QID) | RESPIRATORY_TRACT | Status: DC | PRN

## 2019-06-30 MED ORDER — SUMATRIPTAN SUCCINATE 50 MG PO TABS
50.0000 mg | ORAL_TABLET | Freq: Once | ORAL | Status: AC
  Administered 2019-06-30: 50 mg via ORAL
  Filled 2019-06-30: qty 1

## 2019-06-30 MED ORDER — RISPERIDONE 2 MG PO TABS: 3.0000 mg | ORAL_TABLET | Freq: Every day | ORAL | Status: DC

## 2019-06-30 NOTE — ED Notes (Signed)
Save blue and red tube in main lab °

## 2019-06-30 NOTE — ED Triage Notes (Signed)
Patient here from home with complaints of suicidal ideation that started today. Plane to "slit wrist". Hx of same. Reports multiple life stressors.
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
## 2019-06-30 NOTE — ED Notes (Signed)
Pt has 3 bags of belongings in nurses' station .   Pt has been seen and wand by security.

## 2019-07-01 ENCOUNTER — Other Ambulatory Visit: Payer: Self-pay

## 2019-07-01 ENCOUNTER — Inpatient Hospital Stay (HOSPITAL_COMMUNITY)
Admission: AD | Admit: 2019-07-01 | Discharge: 2019-07-06 | DRG: 885 | Disposition: A | Discharge status: Home / Self Care | Payer: PPO | Source: Intra-hospital | Attending: Psychiatry | Admitting: Psychiatry

## 2019-07-01 ENCOUNTER — Encounter (HOSPITAL_COMMUNITY): Payer: Self-pay | Admitting: Family

## 2019-07-01 DIAGNOSIS — Z148 Genetic carrier of other disease: Secondary | ICD-10-CM | POA: Diagnosis not present

## 2019-07-01 DIAGNOSIS — F314 Bipolar disorder, current episode depressed, severe, without psychotic features: Secondary | ICD-10-CM | POA: Diagnosis not present

## 2019-07-01 DIAGNOSIS — G4710 Hypersomnia, unspecified: Secondary | ICD-10-CM | POA: Diagnosis present

## 2019-07-01 DIAGNOSIS — F4310 Post-traumatic stress disorder, unspecified: Secondary | ICD-10-CM | POA: Diagnosis not present

## 2019-07-01 DIAGNOSIS — Z79899 Other long term (current) drug therapy: Secondary | ICD-10-CM | POA: Diagnosis not present

## 2019-07-01 DIAGNOSIS — G4730 Sleep apnea, unspecified: Secondary | ICD-10-CM | POA: Diagnosis not present

## 2019-07-01 DIAGNOSIS — Z888 Allergy status to other drugs, medicaments and biological substances status: Secondary | ICD-10-CM

## 2019-07-01 DIAGNOSIS — Z91013 Allergy to seafood: Secondary | ICD-10-CM | POA: Diagnosis not present

## 2019-07-01 DIAGNOSIS — K219 Gastro-esophageal reflux disease without esophagitis: Secondary | ICD-10-CM | POA: Diagnosis not present

## 2019-07-01 DIAGNOSIS — Z882 Allergy status to sulfonamides status: Secondary | ICD-10-CM

## 2019-07-01 DIAGNOSIS — Z7951 Long term (current) use of inhaled steroids: Secondary | ICD-10-CM

## 2019-07-01 DIAGNOSIS — F332 Major depressive disorder, recurrent severe without psychotic features: Secondary | ICD-10-CM | POA: Diagnosis not present

## 2019-07-01 DIAGNOSIS — F250 Schizoaffective disorder, bipolar type: Secondary | ICD-10-CM | POA: Diagnosis not present

## 2019-07-01 DIAGNOSIS — F603 Borderline personality disorder: Secondary | ICD-10-CM | POA: Diagnosis not present

## 2019-07-01 DIAGNOSIS — G4700 Insomnia, unspecified: Secondary | ICD-10-CM | POA: Diagnosis not present

## 2019-07-01 DIAGNOSIS — Z818 Family history of other mental and behavioral disorders: Secondary | ICD-10-CM | POA: Diagnosis not present

## 2019-07-01 DIAGNOSIS — R45851 Suicidal ideations: Secondary | ICD-10-CM | POA: Diagnosis present

## 2019-07-01 DIAGNOSIS — F3130 Bipolar disorder, current episode depressed, mild or moderate severity, unspecified: Secondary | ICD-10-CM | POA: Diagnosis present

## 2019-07-01 DIAGNOSIS — J45909 Unspecified asthma, uncomplicated: Secondary | ICD-10-CM | POA: Diagnosis not present

## 2019-07-01 DIAGNOSIS — Z87891 Personal history of nicotine dependence: Secondary | ICD-10-CM | POA: Diagnosis not present

## 2019-07-01 DIAGNOSIS — Z885 Allergy status to narcotic agent status: Secondary | ICD-10-CM

## 2019-07-01 DIAGNOSIS — Z91030 Bee allergy status: Secondary | ICD-10-CM

## 2019-07-01 LAB — RAPID URINE DRUG SCREEN, HOSP PERFORMED
Amphetamines: NOT DETECTED
Barbiturates: NOT DETECTED
Benzodiazepines: NOT DETECTED
Cocaine: NOT DETECTED
Opiates: NOT DETECTED
Tetrahydrocannabinol: NOT DETECTED

## 2019-07-01 MED ORDER — RISPERIDONE 2 MG PO TABS
2.0000 mg | ORAL_TABLET | Freq: Every morning | ORAL | Status: DC
  Administered 2019-07-02 – 2019-07-06 (×5): 2 mg via ORAL
  Filled 2019-07-01 (×8): qty 1

## 2019-07-01 MED ORDER — TRAZODONE HCL 100 MG PO TABS
100.0000 mg | ORAL_TABLET | Freq: Every evening | ORAL | Status: DC | PRN
  Administered 2019-07-01: 100 mg via ORAL
  Filled 2019-07-01: qty 1

## 2019-07-01 MED ORDER — FAMOTIDINE 10 MG PO TABS
10.0000 mg | ORAL_TABLET | Freq: Every day | ORAL | Status: DC
  Administered 2019-07-02 – 2019-07-06 (×5): 10 mg via ORAL
  Filled 2019-07-01 (×7): qty 1

## 2019-07-01 MED ORDER — SERTRALINE HCL 50 MG PO TABS
50.0000 mg | ORAL_TABLET | Freq: Every day | ORAL | Status: DC
  Administered 2019-07-02: 50 mg via ORAL
  Filled 2019-07-01 (×3): qty 1

## 2019-07-01 MED ORDER — MAGNESIUM HYDROXIDE 400 MG/5ML PO SUSP: 30.0000 mL | Freq: Every day | ORAL | Status: DC | PRN

## 2019-07-01 MED ORDER — RISPERIDONE 3 MG PO TABS
3.0000 mg | ORAL_TABLET | Freq: Every day | ORAL | Status: DC
  Administered 2019-07-01 – 2019-07-05 (×5): 3 mg via ORAL
  Filled 2019-07-01 (×3): qty 1
  Filled 2019-07-01: qty 3
  Filled 2019-07-01: qty 1
  Filled 2019-07-01: qty 3
  Filled 2019-07-01 (×3): qty 1

## 2019-07-01 MED ORDER — HYDROXYZINE HCL 50 MG PO TABS
50.0000 mg | ORAL_TABLET | Freq: Every evening | ORAL | Status: DC | PRN
  Administered 2019-07-01: 50 mg via ORAL
  Filled 2019-07-01: qty 1

## 2019-07-01 MED ORDER — HYDROXYZINE HCL 25 MG PO TABS
25.0000 mg | ORAL_TABLET | Freq: Three times a day (TID) | ORAL | Status: DC
  Administered 2019-07-02: 25 mg via ORAL
  Filled 2019-07-01 (×8): qty 1

## 2019-07-01 MED ORDER — ACETAMINOPHEN 325 MG PO TABS: 650.0000 mg | ORAL_TABLET | Freq: Four times a day (QID) | ORAL | Status: DC | PRN

## 2019-07-01 MED ORDER — MOMETASONE FURO-FORMOTEROL FUM 200-5 MCG/ACT IN AERO
2.0000 | INHALATION_SPRAY | Freq: Two times a day (BID) | RESPIRATORY_TRACT | Status: DC
  Administered 2019-07-02 – 2019-07-06 (×9): 2 via RESPIRATORY_TRACT
  Filled 2019-07-01 (×2): qty 8.8

## 2019-07-01 MED ORDER — ALUM & MAG HYDROXIDE-SIMETH 200-200-20 MG/5ML PO SUSP
30.0000 mL | ORAL | Status: DC | PRN
  Administered 2019-07-03: 30 mL via ORAL

## 2019-07-01 MED ORDER — HYDROXYZINE HCL 25 MG PO TABS: 25.0000 mg | ORAL_TABLET | ORAL | Status: DC

## 2019-07-01 MED ORDER — PANTOPRAZOLE SODIUM 40 MG PO TBEC
40.0000 mg | DELAYED_RELEASE_TABLET | Freq: Every day | ORAL | Status: DC
  Administered 2019-07-02 – 2019-07-06 (×5): 40 mg via ORAL
  Filled 2019-07-01 (×7): qty 1

## 2019-07-01 MED ORDER — ALBUTEROL SULFATE HFA 108 (90 BASE) MCG/ACT IN AERS
1.0000 | INHALATION_SPRAY | Freq: Four times a day (QID) | RESPIRATORY_TRACT | Status: DC | PRN
  Filled 2019-07-01: qty 6.7

## 2019-07-01 NOTE — ED Notes (Signed)
Called TTS to follow up. Consult machine placed at bedside call in progress.
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
## 2019-07-01 NOTE — ED Notes (Signed)
TTS assessment completed. Talbot Grumbling, NP, patient meets inpatient criteria. TTS to secure placement.

## 2019-07-01 NOTE — [facility] Group Notes (Signed)
Adult Psychoeducational Group Note  Date:  07/01/2019 Time:  9:42 PM  Group Topic/Focus:  Wrap-Up Group:   The focus of this group is to help patients review their daily goal of treatment and discuss progress on daily workbooks.  Participation Level:  Active  Participation Quality:  Appropriate and Attentive  Affect:  Appropriate  Cognitive:  Alert and Appropriate  Insight: Appropriate and Good  Engagement in Group:  Engaged  Modes of Intervention:  Discussion and Education  Additional Comments:  Pt attended and participated in wrap up group this evening and rated their day a 1/10, due to them being very anxious and being suicidal when they first came in. Pt goal while they are here is to find coping skills for anxiety.   Cristi Loron 07/01/2019, 9:42 PM
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
## 2019-07-01 NOTE — BH Assessment (Signed)
[facility] Acute Care Assessment Progress Note  Per Sheran Fava, DNP, this pt requires psychiatric hospitalization at this time.  Jasmine has assigned pt to [facility] Rm 302-2; [facility] will be ready to receive pt at 16:00.  Pt has signed Voluntary Admission and Consent for Treatment, as well as Consent to Release Information to her providers at [facility] and the [facility], and notification calls have been placed.  Signed forms have been faxed to [facility].  Pt's nurse, Eustaquio Maize, has been notified, and agrees to send original paperwork along with pt via [facility], and to call report to [phone].  [name], [place] Coordinator [phone]

## 2019-07-01 NOTE — ED Notes (Signed)
Bed assigned 302-2 at 4pm. Call report to [phone]. Sonya RN Aware.

## 2019-07-01 NOTE — ED Notes (Signed)
Adaku Anike, NP, patient meets inpatient criteria. TTS to secure placement.  
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
## 2019-07-01 NOTE — ED Notes (Signed)
Transportation arranged

## 2019-07-01 NOTE — ED Notes (Signed)
Pt still verbalizes she is suicidal, plan to cut wrist. States she took restraining order out on s/o. She is also in custody battle for child.

## 2019-07-01 NOTE — Tx Team (Addendum)
Initial Treatment Plan 07/01/2019 9:15 PM [name] [id number]    PATIENT STRESSORS: Legal issue Marital or family conflict Traumatic event   PATIENT STRENGTHS: Ability for insight Communication skills General fund of knowledge   PATIENT IDENTIFIED PROBLEMS: anxiety  depression  Suicidal ideation                 DISCHARGE CRITERIA:  Ability to meet basic life and health needs Adequate post-discharge living arrangements Improved stabilization in mood, thinking, and/or behavior  PRELIMINARY DISCHARGE PLAN: Attend aftercare/continuing care group Outpatient therapy Return to previous living arrangement Return to previous work or school arrangements  PATIENT/FAMILY INVOLVEMENT: This treatment plan has been presented to and reviewed with the patient, [name].  The patient has been given the opportunity to ask questions and make suggestions.  Luna Glasgow, RN 07/01/2019, 9:15 PM

## 2019-07-01 NOTE — ED Notes (Signed)
[facility] arrived. Patient transported with her belonings.

## 2019-07-02 DIAGNOSIS — F332 Major depressive disorder, recurrent severe without psychotic features: Secondary | ICD-10-CM

## 2019-07-02 LAB — LIPID PANEL
Cholesterol: 192 mg/dL (ref 0–200)
HDL: 44 mg/dL (ref >40)
LDL Cholesterol: 122 mg/dL — ABNORMAL HIGH (ref 0–99)
Total CHOL/HDL Ratio: 4.4 RATIO
Triglycerides: 131 mg/dL (ref <150)
VLDL: 26 mg/dL (ref 0–40)

## 2019-07-02 LAB — TSH: TSH: 1.877 u[IU]/mL (ref 0.350–4.500)

## 2019-07-02 LAB — HEMOGLOBIN A1C
Hgb A1c MFr Bld: 5 % (ref 4.8–5.6)
Mean Plasma Glucose: 96.8 mg/dL

## 2019-07-02 MED ORDER — ONDANSETRON HCL 4 MG PO TABS
4.0000 mg | ORAL_TABLET | Freq: Once | ORAL | Status: AC
  Administered 2019-07-02: 4 mg via ORAL
  Filled 2019-07-02 (×2): qty 1

## 2019-07-02 MED ORDER — HYDROXYZINE HCL 25 MG PO TABS
25.0000 mg | ORAL_TABLET | Freq: Four times a day (QID) | ORAL | Status: DC | PRN
  Administered 2019-07-02 – 2019-07-05 (×5): 25 mg via ORAL
  Filled 2019-07-02 (×5): qty 1

## 2019-07-02 MED ORDER — TRAZODONE HCL 50 MG PO TABS: 50.0000 mg | ORAL_TABLET | Freq: Once | ORAL | Status: DC

## 2019-07-02 MED ORDER — TRAZODONE HCL 50 MG PO TABS
50.0000 mg | ORAL_TABLET | Freq: Every evening | ORAL | Status: DC | PRN
  Administered 2019-07-02: 50 mg via ORAL
  Filled 2019-07-02: qty 1

## 2019-07-02 MED ORDER — SERTRALINE HCL 100 MG PO TABS
100.0000 mg | ORAL_TABLET | Freq: Every day | ORAL | Status: DC
  Administered 2019-07-03 – 2019-07-06 (×4): 100 mg via ORAL
  Filled 2019-07-02 (×7): qty 1

## 2019-07-02 NOTE — Progress Notes (Addendum)
Pt couldn't contract for safety so placed in the quiet room on 500 hall. Talked with pt about stressors and reason she can't contract. Pt cried and hit the walls in the padded room. Pt stated that she would contract for safety and was allowed to go back to her room with the caveat that she would go back to the quiet room if anything changes.
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
## 2019-07-02 NOTE — Progress Notes (Signed)
   07/02/19 2100  Psych Admission Type (Psych Patients Only)  Admission Status Voluntary  Psychosocial Assessment  Patient Complaints Anxiety;Depression;Hopelessness  Eye Contact Brief  Facial Expression Anxious  Affect Anxious;Labile  Speech Soft;Logical/coherent  Interaction Assertive  Motor Activity Slow  Appearance/Hygiene Unremarkable  Behavior Characteristics Cooperative;Anxious;Agitated  Mood Anxious;Labile;Depressed  Aggressive Behavior  Targets Self  Type of Behavior Other (Comment) (hitting her arms against the wall)  Thought Process  Coherency WDL  Content WDL;Blaming others;Preoccupation  Delusions None reported or observed  Perception WDL  Hallucination None reported or observed  Judgment Impaired  Confusion None  Danger to Self  Current suicidal ideation? Active  Self-Injurious Behavior Self-injurious ideation verbalized (pt stated she would drown herself)  Agreement Not to Harm Self No (pt refused to contract so taken to the quiet room)  Danger to Others  Danger to Others None reported or observed   Pt seen at shift change sitting in the floor outside the dayroom. Pt was hitting her arms against the corner of the wall. Pt agitated because she states that no one has helped her today. Stated that they are talking to her but not listening. Pt says she came for help but is not getting any. Endorses SI all day. Pt said she would crack her head against the wall or drown herself. Pt taken to isolation room.  Pt began to cry and and stated that she misses her daughter [name] who is 67 years old. Custody is being granted by the courts to her former in-laws. Emotional support given to pt. Pt then stated she wanted to go back to her room and agreed to come to staff if she felt like harming herself. Pt allowed to go back to her room on 300 hall.

## 2019-07-02 NOTE — Progress Notes (Signed)
[facility] Notes:  (Nursing/MHT/Case Management/Adjunct)  Date:  07/02/2019  Time:  2045  Type of Therapy:  wrap up group  Participation Level:  Active  Participation Quality:  Appropriate, Attentive, Sharing and Supportive  Affect:  Blunted and Irritable  Cognitive:  Lacking  Insight:  Lacking  Engagement in Group:  Engaged  Modes of Intervention:  Clarification, Education and Support  Summary of Progress/Problems:Positive thinking and positive change were discussed. Pt concerned with her medication orders and dosage prescribed. Pt interested in virtual groups upon discharge and getting help towards visitation rights with daughter.   Shellia Cleverly 07/02/2019, 9:12 PM

## 2019-07-02 NOTE — Progress Notes (Signed)
D:  Patient denied SI and HI, contracts for safety.  Denied A/V hallucinations.  Denied pain. A:  Medications administered per MD orders.  Emotional support and encouragement given patient. R:  Safety maintained with 15 minute checks.  

## 2019-07-02 NOTE — Progress Notes (Signed)
   07/01/19 2130  Psych Admission Type (Psych Patients Only)  Admission Status Voluntary  Psychosocial Assessment  Patient Complaints Anxiety  Eye Contact Brief  Facial Expression Anxious  Affect Anxious  Speech Soft;Logical/coherent  Interaction Assertive  Motor Activity Slow  Appearance/Hygiene Unremarkable  Behavior Characteristics Cooperative;Anxious  Mood Pleasant;Anxious  Thought Process  Coherency WDL  Content WDL  Delusions None reported or observed  Perception WDL  Hallucination None reported or observed  Judgment Impaired  Confusion None  Danger to Self  Current suicidal ideation? Denies  Danger to Others  Danger to Others None reported or observed   Pt rates anxiety 10/10. Given PRN Vistaril 50 mg.

## 2019-07-02 NOTE — Progress Notes (Signed)
Pt was lying in bed yelling out "assholes, fuckers, no one is listening to me". This Probation officer entered the room and introduced myself and told patient that she may have a hard day but I do not want to be called theses names all night. Pt reported she was not calling me those names but the doctors and nurses who have not listened to what she needs today.  Pt was witnessed by this writer yelling and punching wall at shift change while another technician was talking, deescalating, and listening to patient. Pt reports she needs some of her medication increased and started on a different medication for anxiety. Pt plans on attending evening wrap up group.  Nurse notified.
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
## 2019-07-03 DIAGNOSIS — F3130 Bipolar disorder, current episode depressed, mild or moderate severity, unspecified: Secondary | ICD-10-CM | POA: Diagnosis present

## 2019-07-03 MED ORDER — SUMATRIPTAN SUCCINATE 50 MG PO TABS
50.0000 mg | ORAL_TABLET | Freq: Once | ORAL | Status: AC
  Administered 2019-07-03: 50 mg via ORAL
  Filled 2019-07-03 (×2): qty 1

## 2019-07-03 MED ORDER — IBUPROFEN 100 MG/5ML PO SUSP: 400.0000 mg | Freq: Three times a day (TID) | ORAL | Status: DC | PRN

## 2019-07-03 MED ORDER — IBUPROFEN 400 MG PO TABS
400.0000 mg | ORAL_TABLET | Freq: Three times a day (TID) | ORAL | Status: DC | PRN
  Administered 2019-07-03 – 2019-07-04 (×2): 400 mg via ORAL
  Filled 2019-07-03 (×3): qty 1

## 2019-07-03 MED ORDER — TRAZODONE HCL 100 MG PO TABS
100.0000 mg | ORAL_TABLET | Freq: Every evening | ORAL | Status: DC | PRN
  Administered 2019-07-03 – 2019-07-04 (×2): 100 mg via ORAL
  Filled 2019-07-03 (×2): qty 1

## 2019-07-03 MED ORDER — BUSPIRONE HCL 5 MG PO TABS
5.0000 mg | ORAL_TABLET | Freq: Three times a day (TID) | ORAL | Status: DC
  Administered 2019-07-03 – 2019-07-06 (×10): 5 mg via ORAL
  Filled 2019-07-03 (×16): qty 1

## 2019-07-03 NOTE — Progress Notes (Signed)
Pt denied SI/HI/AVH.  Pt reported that she is anxious.  Pt reported that her goal is "to get my meds straightened out."  RN actively listened to pt and provided encouragement.  RN administered medication per MD orders.  RN maintained q 15 min safety checks per protocol.  Pt has been spending most of the day in her room.  She has not attended groups today.  RN will continue to encourage pt to be social and go to therapy groups.  RN will continue to monitor and provide support as needed.

## 2019-07-03 NOTE — Progress Notes (Signed)
Recreation Therapy Notes  Date:  5.28.21 Time: 0930 Location: 300 Hall Group Room  Group Topic: Stress Management  Goal Area(s) Addresses:  Patient will identify positive stress management techniques. Patient will identify benefits of using stress management post d/c.  Intervention: Stress Management  Activity: Guided Imagery.  LRT read a script that lead patients on a journey being outside at night observing the stars in the sky.  Patients were to listen and follow along as script was read to engage in activity.   Education:  Stress Management, Discharge Planning.   Education Outcome: Acknowledges Education  Clinical Observations/Feedback: Pt did not attend activity.    Victorino Sparrow, LRT/CTRS        Victorino Sparrow A 07/03/2019 12:09 PM

## 2019-07-03 NOTE — Progress Notes (Signed)
[facility] MD Progress Note  07/03/2019 11:21 AM [name]  MRN:  [id number]   Subjective: Follow-up for this 29 year old female diagnosed with bipolar 1 disorder most recent episode depressed and borderline personality disorder.  Patient reports today that she is doing a little bit better.  She is continues to report that her sleep has been broken and is requesting to have her trazodone increased to 100 mg at bedtime.  She reports that her anxiety is a 10 out of 10 with 10 being the worst and is requesting to be started on BuSpar because it is work for her in the past.  She states that Vistaril does not feel like it really reduces her anxiety any.  She reports symptoms some continued self-harm thoughts that come and go throughout the day but none currently.  She denies any homicidal ideations and denies any hallucinations.  She reports that she knows that she will get better as the medications kick in and feels that the Zoloft, Risperdal, and BuSpar will help her significantly.  She reports that her appetite has been improving and the longer she stays here.  Patient also reports that she is hoping to get back onto a ACT team and that social work has been discussing this with her and helping her out.  Principal Problem: Bipolar I disorder, most recent episode depressed ([place]) Diagnosis: Principal Problem:   Bipolar I disorder, most recent episode depressed ([place]) Active Problems:   Borderline personality disorder ([place])  Total Time spent with patient: 30 minutes  Past Psychiatric History: Bipolar disorder, borderline personality disorder, MDD, Schizoaffective.  Past Medical History:  Past Medical History:  Diagnosis Date  . Acid reflux   . Anxiety   . Asthma    last attack 03/13/15 or 03/14/15  . Autism   . 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 Chronic constipation   . Depression   . Drug-seeking behavior   . Essential tremor   . Headache   . Overdose of acetaminophen 07/2017   and other  meds  . Personality disorder ([place])   . Schizo-affective psychosis ([place])   . Schizoaffective disorder, bipolar type ([place])   . Seizures [facility])    Last seizure December 2017  . Sleep apnea     Past Surgical History:  Procedure Laterality Date  . MOUTH SURGERY  2009 or 2010   Family History:  Family History  Problem Relation Age of Onset  . Mental illness Father   . Asthma Father   . PDD Brother   . Seizures Brother    Family Psychiatric  History: Major depression: Father                                                      Bipolar disorder: Brother                                                       Completed suicide: Father & brother.  Social History:  Social History   Substance and Sexual Activity  Alcohol Use No  . Alcohol/week: 1.0 standard drinks  . Types: 1 Standard drinks or equivalent per week     Social  History   Substance and Sexual Activity  Drug Use No   Comment: History of cocaine use at age 60 for 4 months    Social History   Socioeconomic History  . Marital status: Widowed    Spouse name: Not on file  . Number of children: 0  . Years of education: Not on file  . Highest education level: Not on file  Occupational History  . Occupation: disability  Tobacco Use  . Smoking status: Former Smoker    Packs/day: 0.00  . Smokeless tobacco: Never Used  . Tobacco comment: Smoked for 2  years age [age]  Substance and Sexual Activity  . Alcohol use: No    Alcohol/week: 1.0 standard drinks    Types: 1 Standard drinks or equivalent per week  . Drug use: No    Comment: History of cocaine use at age 65 for 4 months  . Sexual activity: Yes    Birth control/protection: None  Other Topics Concern  . Not on file  Social History Narrative   Marital status: Widowed      Children: daughter      Lives: with boyfriend, in two story home      Employment:  Disability      Tobacco: quit smoking; smoked for two years.      Alcohol ;none      Drugs: none   Has  not traveled outside of the country.   Right handed         Social Determinants of Health   Financial Resource Strain:   . Difficulty of Paying Living Expenses:   Food Insecurity:   . Worried About Charity fundraiser in the Last Year:   . Arboriculturist in the Last Year:   Transportation Needs:   . Film/video editor (Medical):   Marland Kitchen Lack of Transportation (Non-Medical):   Physical Activity:   . Days of Exercise per Week:   . Minutes of Exercise per Session:   Stress:   . Feeling of Stress :   Social Connections:   . Frequency of Communication with Friends and Family:   . Frequency of Social Gatherings with Friends and Family:   . Attends Religious Services:   . Active Member of Clubs or Organizations:   . Attends Archivist Meetings:   Marland Kitchen Marital Status:    Additional Social History:                         Sleep: Fair  Appetite:  Fair  Current Medications: Current Facility-Administered Medications  Medication Dose Route Frequency Provider Last Rate Last Admin  . acetaminophen (TYLENOL) tablet 650 mg  650 mg Oral Q6H PRN Suella Broad, FNP      . albuterol (VENTOLIN HFA) 108 (90 Base) MCG/ACT inhaler 1-2 puff  1-2 puff Inhalation Q6H PRN Suella Broad, FNP      . alum & mag hydroxide-simeth (MAALOX/MYLANTA) 200-200-20 MG/5ML suspension 30 mL  30 mL Oral Q4H PRN Starkes-Perry, Gayland Curry, FNP      . busPIRone (BUSPAR) tablet 5 mg  5 mg Oral TID Nitza Schmid, Lowry Ram, FNP      . famotidine (PEPCID) tablet 10 mg  10 mg Oral Daily Suella Broad, FNP   10 mg at 07/03/19 0801  . hydrOXYzine (ATARAX/VISTARIL) tablet 25 mg  25 mg Oral Q6H PRN Cobos, Myer Peer, MD   25 mg at 07/02/19 1912  . magnesium hydroxide (MILK  OF MAGNESIA) suspension 30 mL  30 mL Oral Daily PRN Starkes-Perry, Gayland Curry, FNP      . mometasone-formoterol (DULERA) 200-5 MCG/ACT inhaler 2 puff  2 puff Inhalation BID Suella Broad, FNP   2 puff at 07/03/19 0802   . pantoprazole (PROTONIX) EC tablet 40 mg  40 mg Oral Daily Suella Broad, FNP   40 mg at 07/03/19 0801  . risperiDONE (RISPERDAL) tablet 2 mg  2 mg Oral q AM Suella Broad, FNP   2 mg at 07/03/19 0657  . risperiDONE (RISPERDAL) tablet 3 mg  3 mg Oral QHS Suella Broad, FNP   3 mg at 07/02/19 2059  . sertraline (ZOLOFT) tablet 100 mg  100 mg Oral Daily Cobos, Myer Peer, MD   100 mg at 07/03/19 0800  . traZODone (DESYREL) tablet 100 mg  100 mg Oral QHS PRN Torre Pikus, Lowry Ram, FNP        Lab Results:  Results for orders placed or performed during the hospital encounter of 07/01/19 (from the past 48 hour(s))  Hemoglobin A1c     Status: None   Collection Time: 07/02/19  6:32 AM  Result Value Ref Range   Hgb A1c MFr Bld 5.0 4.8 - 5.6 %    Comment: (NOTE) Pre diabetes:          5.7%-6.4% Diabetes:              >6.4% Glycemic control for   <7.0% adults with diabetes    Mean Plasma Glucose 96.8 mg/dL    Comment: Performed at [facility], [place] [address] [place], [place] [address]  Lipid panel     Status: Abnormal   Collection Time: 07/02/19  6:32 AM  Result Value Ref Range   Cholesterol 192 0 - 200 mg/dL   Triglycerides 131 <150 mg/dL   HDL 44 >40 mg/dL   Total CHOL/HDL Ratio 4.4 RATIO   VLDL 26 0 - 40 mg/dL   LDL Cholesterol 122 (H) 0 - 99 mg/dL    Comment:        Total Cholesterol/HDL:CHD Risk Coronary Heart Disease Risk Table                     Men   Women  1/2 Average Risk   3.4   3.3  Average Risk       5.0   4.4  2 X Average Risk   9.6   7.1  3 X Average Risk  23.4   11.0        Use the calculated Patient Ratio above and the CHD Risk Table to determine the patient's CHD Risk.        ATP III CLASSIFICATION (LDL):  <100     mg/dL   Optimal  100-129  mg/dL   Near or Above                    Optimal  130-159  mg/dL   Borderline  160-189  mg/dL   High  >190     mg/dL   Very High Performed at [place] [address] [name], [place] [address]   TSH     Status: None   Collection Time: 07/02/19  6:32 AM  Result Value Ref Range   TSH 1.877 0.350 - 4.500 uIU/mL    Comment: Performed by a 3rd Generation assay with a functional sensitivity of <=0.01 uIU/mL. Performed at [facility]  [facility], [place] [address] [place], [place] [address]     Blood Alcohol level:  Lab Results  Component Value Date   ETH <10 06/30/2019   ETH <10 123XX123    Metabolic Disorder Labs: Lab Results  Component Value Date   HGBA1C 5.0 07/02/2019   MPG 96.8 07/02/2019   MPG 91.06 03/06/2019   Lab Results  Component Value Date   PROLACTIN 129.0 (H) 07/25/2017   PROLACTIN 194.7 (H) 11/22/2016   Lab Results  Component Value Date   CHOL 192 07/02/2019   TRIG 131 07/02/2019   HDL 44 07/02/2019   CHOLHDL 4.4 07/02/2019   VLDL 26 07/02/2019   LDLCALC 122 (H) 07/02/2019   LDLCALC 92 03/06/2019    Physical Findings: AIMS: Facial and Oral Movements Muscles of Facial Expression: None, normal Lips and Perioral Area: None, normal Jaw: None, normal Tongue: None, normal,Extremity Movements Upper (arms, wrists, hands, fingers): None, normal Lower (legs, knees, ankles, toes): None, normal, Trunk Movements Neck, shoulders, hips: None, normal, Overall Severity Severity of abnormal movements (highest score from questions above): None, normal Incapacitation due to abnormal movements: None, normal Patient's awareness of abnormal movements (rate only patient's report): No Awareness, Dental Status Current problems with teeth and/or dentures?: No Does patient usually wear dentures?: No  CIWA:    COWS:     Musculoskeletal: Strength & Muscle Tone: within normal limits Gait & Station: normal Patient leans: N/A  Psychiatric Specialty Exam: Physical Exam  Nursing note and vitals reviewed. Constitutional: She is oriented to person, place, and time. She appears well-developed and well-nourished.  Cardiovascular:  Normal rate.  Respiratory: Effort normal.  Musculoskeletal:        General: Normal range of motion.  Neurological: She is alert and oriented to person, place, and time.  Skin: Skin is warm.    Review of Systems  Constitutional: Negative.   HENT: Negative.   Eyes: Negative.   Respiratory: Negative.   Cardiovascular: Negative.   Gastrointestinal: Negative.   Genitourinary: Negative.   Musculoskeletal: Negative.   Skin: Negative.   Neurological: Negative.   Psychiatric/Behavioral: The patient is nervous/anxious.     Blood pressure 117/67, pulse 79, temperature 97.8 F (36.6 C), temperature source Oral, resp. rate 16, height 5\' 5"  (1.651 m), weight 88.9 kg, SpO2 100 %.Body mass index is 32.62 kg/m.  General Appearance: Disheveled  Eye Contact:  Fair  Speech:  Clear and Coherent and Normal Rate  Volume:  Decreased  Mood:  Anxious and Depressed  Affect:  Flat  Thought Process:  Coherent and Descriptions of Associations: Intact  Orientation:  Full (Time, Place, and Person)  Thought Content:  WDL  Suicidal Thoughts:  Yes, vague self harms thoughts occassionally  Homicidal Thoughts:  No  Memory:  Immediate;   Fair Recent;   Fair Remote;   Fair  Judgement:  Fair  Insight:  Fair  Psychomotor Activity:  Normal  Concentration:  Concentration: Fair  Recall:  [employer] of Knowledge:  Fair  Language:  Fair  Akathisia:  No  Handed:  Right  AIMS (if indicated):     Assets:  Desire for Improvement Financial Resources/Insurance Housing Resilience Social Support Transportation  ADL's:  Intact  Cognition:  WNL  Sleep:  Number of Hours: 3   Assessment: Patient presents in her room lying in the bed but is awake.  Patient is pleasant, calm, cooperative.  Patient appears to have a flat affect but does not appear to be very anxious or depressed.  She  does not appear to be restless, agitated or angry.  There is been no threatening behavior.  The patient has not shown any signs or  symptoms of any type of psychotic features.  Patient has been compliant with her medications and did request for additional groups to be added through the day so that she has more to do.  Consulted with Dr. Parke Poisson and agreed to start patient on BuSpar 5 mg p.o. 3 times daily and to increase trazodone 200 mg nightly as needed.  Patient's labs have been reviewed new labs indicating that her lipid profile was within normal limits except for LDL of 122.  Her TSH is 1.877 and her A1c is 5.0.  Patient's vital signs are within normal limits. Of note patient does have a history of being diagnosed with schizoaffective disorder versus bipolar 1 disorder.  Treatment Plan Summary: Daily contact with patient to assess and evaluate symptoms and progress in treatment and Medication management Start BuSpar 5 mg p.o. 3 times daily for anxiety Continue Vistaril 25 mg p.o. every 6 hours as needed for anxiety Continue Risperdal 2 mg p.o. every morning and 3 mg p.o. nightly for bipolar disorder Increase trazodone to 100 mg p.o. nightly as needed for insomnia Encourage group therapy participation Continue to 15-minute safety checks Continue Zoloft 100 mg p.o. daily for depression Continue trazodone 100 mg p.o. nightly as needed for insomnia  Lewis Shock, FNP 07/03/2019, 11:21 AM

## 2019-07-03 NOTE — Tx Team (Signed)
Interdisciplinary Treatment and Diagnostic Plan Update  07/03/2019 Time of Session: 9:00am [name] MRN: [id number]  Principal Diagnosis: MDD (major depressive disorder), recurrent severe, without psychosis ([place])  Secondary Diagnoses: Principal Problem:   MDD (major depressive disorder), recurrent severe, without psychosis ([place])   Current Medications:  Current Facility-Administered Medications  Medication Dose Route Frequency Provider Last Rate Last Admin  . acetaminophen (TYLENOL) tablet 650 mg  650 mg Oral Q6H PRN Suella Broad, FNP      . albuterol (VENTOLIN HFA) 108 (90 Base) MCG/ACT inhaler 1-2 puff  1-2 puff Inhalation Q6H PRN Suella Broad, FNP      . alum & mag hydroxide-simeth (MAALOX/MYLANTA) 200-200-20 MG/5ML suspension 30 mL  30 mL Oral Q4H PRN Burt Ek, Gayland Curry, FNP      . famotidine (PEPCID) tablet 10 mg  10 mg Oral Daily Suella Broad, FNP   10 mg at 07/03/19 0801  . hydrOXYzine (ATARAX/VISTARIL) tablet 25 mg  25 mg Oral Q6H PRN Cobos, Myer Peer, MD   25 mg at 07/02/19 1912  . magnesium hydroxide (MILK OF MAGNESIA) suspension 30 mL  30 mL Oral Daily PRN Starkes-Perry, Gayland Curry, FNP      . mometasone-formoterol (DULERA) 200-5 MCG/ACT inhaler 2 puff  2 puff Inhalation BID Suella Broad, FNP   2 puff at 07/03/19 0802  . pantoprazole (PROTONIX) EC tablet 40 mg  40 mg Oral Daily Suella Broad, FNP   40 mg at 07/03/19 0801  . risperiDONE (RISPERDAL) tablet 2 mg  2 mg Oral q AM Suella Broad, FNP   2 mg at 07/03/19 0657  . risperiDONE (RISPERDAL) tablet 3 mg  3 mg Oral QHS Suella Broad, FNP   3 mg at 07/02/19 2059  . sertraline (ZOLOFT) tablet 100 mg  100 mg Oral Daily Cobos, Myer Peer, MD   100 mg at 07/03/19 0800  . traZODone (DESYREL) tablet 50 mg  50 mg Oral QHS PRN Cobos, Myer Peer, MD   50 mg at 07/02/19 2059   PTA Medications: Medications Prior to Admission  Medication Sig Dispense Refill  Last Dose  . albuterol (PROVENTIL HFA;VENTOLIN HFA) 108 (90 Base) MCG/ACT inhaler Inhale 1-2 puffs into the lungs every 6 (six) hours as needed for wheezing or shortness of breath.     . Ascorbic Acid (VITAMIN C) 1000 MG tablet Take 1,000 mg by mouth daily.     . Capsaicin (MUSCLE RELIEF EX) Apply 1 application topically 2 (two) times daily as needed (pain).     . famotidine (PEPCID) 10 MG tablet Take 10 mg by mouth daily.     . fluticasone-salmeterol (ADVAIR HFA) 115-21 MCG/ACT inhaler Inhale 2 puffs into the lungs 2 (two) times daily. 1 Inhaler 5   . hydrOXYzine (ATARAX/VISTARIL) 25 MG tablet Take 1 tablet (25 mg) by mouth three times daily & 2 tablets (50 mg) at bedtime as needed: For sleep (Patient taking differently: Take 25-50 mg by mouth See admin instructions. Take 1 tablet (25 mg) by mouth three times daily & 2 tablets (50 mg) at bedtime as needed: For sleep) 90 tablet 0   . Multiple Vitamin (MULTIVITAMIN WITH MINERALS) TABS tablet Take 1 tablet by mouth daily.     . ondansetron (ZOFRAN ODT) 8 MG disintegrating tablet Take 1 tablet (8 mg total) by mouth every 8 (eight) hours as needed for nausea or vomiting. 10 tablet 0   . pantoprazole (PROTONIX) 40 MG tablet Take 1 tablet (40 mg total)  by mouth daily. 30 tablet 0   . risperiDONE (RISPERDAL) 2 MG tablet Take 2 mg by mouth in the morning.     . risperiDONE (RISPERDAL) 3 MG tablet Take 1 tablet (3 mg total) by mouth at bedtime. For mood control 30 tablet 0   . sertraline (ZOLOFT) 50 MG tablet Take 50 mg by mouth daily.     . SUMAtriptan (IMITREX) 50 MG tablet Take 1 tablet earliest onset of migraine.  May repeat in 2 hours if headache persists or recurs.  Maximum 2 tablets in 24 hours. 10 tablet 3   . vitamin B-12 (CYANOCOBALAMIN) 1000 MCG tablet Take 2,000 mcg by mouth daily.       Patient Stressors: Legal issue Marital or family conflict Traumatic event  Patient Strengths: Ability for insight Curator fund of  knowledge  Treatment Modalities: Medication Management, Group therapy, Case management,  1 to 1 session with clinician, Psychoeducation, Recreational therapy.   Physician Treatment Plan for Primary Diagnosis: MDD (major depressive disorder), recurrent severe, without psychosis ([place]) Long Term Goal(s): Improvement in symptoms so as ready for discharge Improvement in symptoms so as ready for discharge   Short Term Goals: Ability to verbalize feelings will improve Ability to demonstrate self-control will improve Ability to identify and develop effective coping behaviors will improve Compliance with prescribed medications will improve  Medication Management: Evaluate patient's response, side effects, and tolerance of medication regimen.  Therapeutic Interventions: 1 to 1 sessions, Unit Group sessions and Medication administration.  Evaluation of Outcomes: Progressing  Physician Treatment Plan for Secondary Diagnosis: Principal Problem:   MDD (major depressive disorder), recurrent severe, without psychosis ([place])  Long Term Goal(s): Improvement in symptoms so as ready for discharge Improvement in symptoms so as ready for discharge   Short Term Goals: Ability to verbalize feelings will improve Ability to demonstrate self-control will improve Ability to identify and develop effective coping behaviors will improve Compliance with prescribed medications will improve     Medication Management: Evaluate patient's response, side effects, and tolerance of medication regimen.  Therapeutic Interventions: 1 to 1 sessions, Unit Group sessions and Medication administration.  Evaluation of Outcomes: Progressing   RN Treatment Plan for Primary Diagnosis: MDD (major depressive disorder), recurrent severe, without psychosis ([place]) Long Term Goal(s): Knowledge of disease and therapeutic regimen to maintain health will improve  Short Term Goals: Ability to participate in decision making will improve,  Ability to verbalize feelings will improve and Ability to identify and develop effective coping behaviors will improve  Medication Management: RN will administer medications as ordered by provider, will assess and evaluate patient's response and provide education to patient for prescribed medication. RN will report any adverse and/or side effects to prescribing provider.  Therapeutic Interventions: 1 on 1 counseling sessions, Psychoeducation, Medication administration, Evaluate responses to treatment, Monitor vital signs and CBGs as ordered, Perform/monitor CIWA, COWS, AIMS and Fall Risk screenings as ordered, Perform wound care treatments as ordered.  Evaluation of Outcomes: Progressing   LCSW Treatment Plan for Primary Diagnosis: MDD (major depressive disorder), recurrent severe, without psychosis ([place]) Long Term Goal(s): Safe transition to appropriate next level of care at discharge, Engage patient in therapeutic group addressing interpersonal concerns.  Short Term Goals: Engage patient in aftercare planning with referrals and resources, Increase social support, Increase emotional regulation and Increase skills for wellness and recovery  Therapeutic Interventions: Assess for all discharge needs, 1 to 1 time with Social worker, Explore available resources and support systems, Assess for adequacy in community  support network, Educate family and significant other(s) on suicide prevention, Complete Psychosocial Assessment, Interpersonal group therapy.  Evaluation of Outcomes: Progressing   Progress in Treatment: Attending groups: No. Participating in groups: No. Taking medication as prescribed: Yes. Toleration medication: Yes. Family/Significant other contact made: No, will contact:  aunt Patient understands diagnosis: Yes. Discussing patient identified problems/goals with staff: Yes. Medical problems stabilized or resolved: Yes. Denies suicidal/homicidal ideation: Yes. Issues/concerns  per patient self-inventory: Yes.  New problem(s) identified: Yes, Describe:  Child custody issues, limited social supports  New Short Term/Long Term Goal(s): medication management for mood stabilization; elimination of SI thoughts; development of comprehensive mental wellness/sobriety plan.  Patient Goals: "Get on buispar for anxiety. Up my Zoloft."  Discharge Plan or Barriers: Continue outpatient [facility] with providers at [facility]  Reason for Continuation of Hospitalization: Anxiety Depression Medication stabilization  Estimated Length of Stay: 3-5 days  Attendees: Patient: [name] 07/03/2019 9:34 AM  Physician:  07/03/2019 9:34 AM  Nursing:  07/03/2019 9:34 AM  RN Care Manager: 07/03/2019 9:34 AM  Social Worker: Stephanie Acre, Latanya Presser 07/03/2019 9:34 AM  Recreational Therapist:  07/03/2019 9:34 AM  Other: Marvia Pickles, NP 07/03/2019 9:34 AM  Other:  07/03/2019 9:34 AM  Other: 07/03/2019 9:34 AM    Scribe for Treatment Team: Joellen Jersey, [place] 07/03/2019 9:34 AM

## 2019-07-03 NOTE — Progress Notes (Signed)
Pt c/o migraine. Provider notified. Given one-time dose of Imitrex 50 mg.

## 2019-07-03 NOTE — [facility] Group Notes (Signed)
07/03/2019 8:45am Type of Group and Topic: Psychoeducational Group: Discharge Planning  Participation Level: Did Not Attend  Description of Group Discharge planning group reviews patient's anticipated discharge plans and assists patients to anticipate and address any barriers to wellness/recovery in the community. Suicide prevention education is reviewed with patients in group. Therapeutic Goals 1. Patients will state their anticipated discharge plan and mental health aftercare 2. Patients will identify potential barriers to wellness in the community setting 3. Patients will engage in problem solving, solution focused discussion of ways to anticipate and address barriers to wellness/recovery   Summary of Patient Progress Plan for Discharge/Comments:  Invited, chose not to attend.      Radonna Ricker, MSW, [place] Worker [facility]  Phone: [phone] 07/03/2019 2:03 PM

## 2019-07-04 MED ORDER — ONDANSETRON 4 MG PO TBDP
ORAL_TABLET | ORAL | Status: AC
  Filled 2019-07-04: qty 1

## 2019-07-04 MED ORDER — ONDANSETRON 4 MG PO TBDP: 4.0000 mg | ORAL_TABLET | Freq: Four times a day (QID) | ORAL | Status: DC | PRN

## 2019-07-04 NOTE — [facility] Group Notes (Signed)
Adult Psychoeducational Group Note  Date:  07/04/2019 Time:  3:07 PM  Group Topic/Focus:  Goals Group:   The focus of this group is to help patients establish daily goals to achieve during treatment and discuss how the patient can incorporate goal setting into their daily lives to aide in recovery.  Participation Level:  Did Not Attend    [name] 07/04/2019, 3:07 PM

## 2019-07-04 NOTE — Progress Notes (Signed)
   07/04/19 2156  COVID-19 Daily Checkoff  Have you had a fever (temp > 37.80C/100F)  in the past 24 hours?  No  If you have had runny nose, nasal congestion, sneezing in the past 24 hours, has it worsened? No  COVID-19 EXPOSURE  Have you traveled outside the state in the past 14 days? No  Have you been in contact with someone with a confirmed diagnosis of COVID-19 or PUI in the past 14 days without wearing appropriate PPE? No  Have you been living in the same home as a person with confirmed diagnosis of COVID-19 or a PUI (household contact)? No  Have you been diagnosed with COVID-19? No

## 2019-07-04 NOTE — Plan of Care (Signed)
Visible in the milieu. Alert and oriented x4. Denies SI/HI/ Denies hallucinations. Attending groups and taking medications. Complained of anxiety and received Vistaril. No additional concerns.
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
## 2019-07-04 NOTE — Progress Notes (Signed)
   07/04/19 2159  Psych Admission Type (Psych Patients Only)  Admission Status Voluntary  Psychosocial Assessment  Patient Complaints Anxiety;Irritability;Insomnia  Eye Contact Fair  Facial Expression Animated  Affect Appropriate to circumstance  Speech Logical/coherent  Interaction Assertive;Intrusive;Needy  Motor Activity Other (Comment) (WDL)  Appearance/Hygiene Disheveled;In scrubs  Behavior Characteristics Intrusive;Restless;Impulsive  Mood Labile;Anxious  Thought Process  Coherency WDL  Content WDL  Delusions None reported or observed  Perception WDL  Hallucination None reported or observed  Judgment Poor  Confusion None  Danger to Self  Current suicidal ideation? Passive  Self-Injurious Behavior No self-injurious ideation or behavior indicators observed or expressed   Agreement Not to Harm Self Yes  Description of Agreement verbal  Danger to Others  Danger to Others None reported or observed

## 2019-07-05 MED ORDER — TRAZODONE HCL 100 MG PO TABS
100.0000 mg | ORAL_TABLET | Freq: Every evening | ORAL | Status: DC | PRN
  Administered 2019-07-05 (×3): 100 mg via ORAL
  Filled 2019-07-05 (×4): qty 1

## 2019-07-05 NOTE — Progress Notes (Signed)
   07/05/19 2232  COVID-19 Daily Checkoff  Have you had a fever (temp > 37.80C/100F)  in the past 24 hours?  No  If you have had runny nose, nasal congestion, sneezing in the past 24 hours, has it worsened? No  COVID-19 EXPOSURE  Have you traveled outside the state in the past 14 days? No  Have you been in contact with someone with a confirmed diagnosis of COVID-19 or PUI in the past 14 days without wearing appropriate PPE? No  Have you been living in the same home as a person with confirmed diagnosis of COVID-19 or a PUI (household contact)? No  Have you been diagnosed with COVID-19? No

## 2019-07-05 NOTE — [facility] Group Notes (Signed)
Adult Psychoeducational Group Note  Date:  07/05/2019 Time:  12:28 PM  Group Topic/Focus:   PROGRESSIVE RELAXATION.Marland Kitchen A group where deep breathing is taught and tensing and relaxation muscle groups is used. Imagery is used as well.  Pts are asked to imagine 3 pillars that hold them up when they are not able to hold themselves up.  Participation Level:  Active  Participation Quality:  Appropriate  Affect:  Flat  Cognitive:  Appropriate  Insight: Improving  Engagement in Group:  Engaged  Modes of Intervention:  Activity, Discussion and Education  Additional Comments:  Pt partisipated fully in the group  And stayed on topic  [name] 07/05/2019, 12:28 PM
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
## 2019-07-05 NOTE — Progress Notes (Signed)
   07/05/19 2235  Psych Admission Type (Psych Patients Only)  Admission Status Voluntary  Psychosocial Assessment  Patient Complaints Anxiety  Eye Contact Fair  Facial Expression Animated  Affect Appropriate to circumstance  Speech Logical/coherent  Interaction Needy;Intrusive;Assertive  Motor Activity Other (Comment)  Appearance/Hygiene Disheveled;In scrubs  Behavior Characteristics Appropriate to situation  Mood Anxious  Thought Process  Coherency WDL  Content WDL  Delusions None reported or observed  Perception WDL  Hallucination None reported or observed  Judgment Poor  Confusion None  Danger to Self  Current suicidal ideation? Passive  Self-Injurious Behavior No self-injurious ideation or behavior indicators observed or expressed   Agreement Not to Harm Self Yes  Description of Agreement verbal  Danger to Others  Danger to Others None reported or observed

## 2019-07-05 NOTE — Progress Notes (Addendum)
   07/05/19 0900  Psych Admission Type (Psych Patients Only)  Admission Status Voluntary  Psychosocial Assessment  Patient Complaints Anxiety  Eye Contact Fair  Facial Expression Animated  Affect Appropriate to circumstance  Speech Logical/coherent  Interaction Assertive;Needy  Motor Activity Other (Comment) (WDL)  Appearance/Hygiene Disheveled;In scrubs  Behavior Characteristics Cooperative;Anxious  Mood Anxious  Aggressive Behavior  Targets Self  Type of Behavior Other (Comment) (hitting her arms against the wall)  Thought Process  Coherency WDL  Content WDL  Delusions None reported or observed  Perception WDL  Hallucination None reported or observed  Judgment Poor  Confusion None  Danger to Self  Current suicidal ideation? Passive  Self-Injurious Behavior No self-injurious ideation or behavior indicators observed or expressed   Agreement Not to Harm Self Yes  Description of Agreement verbal  Danger to Others  Danger to Others None reported or observed   Pt has been visible in the milieu interacting appropriately with peers and staff, and has been observed attending groups this am.  Per pt's self inventory, pt rates her depression, hopelessness and anxiety a 1/1/6, respectively. Pt writes that her most important goal today is "talk to doctor about discharging tomorrow". Pt currently denies SI/HI and A/V H. Pt remains safe on the unit with Q 15 min checks

## 2019-07-05 NOTE — Progress Notes (Signed)
   07/05/19 2233  Precautions / Armbands  Precautions None  Patient armbands applied: Patient Identification (White)  [facility] Fall Risk Assessment  Age 29  Mental Status 0  Physical Satus 0  Elimination 0  Sensory Impairments 0  Gait or Balance 0  History or falls in past 6 months 0  Mood Stabilizer Medications 0  Benzodiazepines 0  Narcotics 0  Sedatives/Hypnotics 2  Atypical Anti Psychotics 1  Detox Protocol (Alcohol, Narcotics, etc.) 0  Total Score 3  Patient Fall Risk Level Low fall risk  Required Bundle Interventions *See Row Information* Low fall risk - low requirements implemented  Safe Patient Handling Required Documentation-Repositioning Needs  Assist with movement in bed No  Fragile skin with pressure ulcer No  Unresponsive No  Safe Patient Handling Assessment  Ambulates independently Yes, no lift needed

## 2019-07-06 MED ORDER — SERTRALINE HCL 100 MG PO TABS: 100.0000 mg | ORAL_TABLET | Freq: Every day | ORAL | 0 refills | Status: DC

## 2019-07-06 MED ORDER — HYDROXYZINE HCL 25 MG PO TABS: 25.0000 mg | ORAL_TABLET | Freq: Four times a day (QID) | ORAL | 0 refills | Status: DC | PRN

## 2019-07-06 MED ORDER — RISPERIDONE 3 MG PO TABS: 3.0000 mg | ORAL_TABLET | Freq: Every day | ORAL | 0 refills | Status: DC

## 2019-07-06 MED ORDER — RISPERIDONE 2 MG PO TABS: 2.0000 mg | ORAL_TABLET | Freq: Every morning | ORAL | 0 refills | Status: DC

## 2019-07-06 MED ORDER — BUSPIRONE HCL 5 MG PO TABS: 5.0000 mg | ORAL_TABLET | Freq: Three times a day (TID) | ORAL | 0 refills | Status: DC

## 2019-07-06 MED ORDER — TRAZODONE HCL 100 MG PO TABS: 100.0000 mg | ORAL_TABLET | Freq: Every evening | ORAL | 0 refills | Status: DC | PRN
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
## 2019-07-06 NOTE — Progress Notes (Signed)
  [facility] Adult Case Management Discharge Plan :  Will you be returning to the same living situation after discharge:  Yes,  home. At discharge, do you have transportation home?: Yes,  will need to use Safe Transportation at 1:00pm Do you have the ability to pay for your medications: Yes,  has HTA  Release of information consent forms completed and in the chart;  Patient's signature needed at discharge.  Patient to Follow up at: Follow-up Information    [facility], [place] Follow up on 07/07/2019.   Why: A discharge summary was sent to your CST Team, they could not be reached due to the [facility]. Please follow up with your team on 06/01. Contact information: [address] [place] [address] [phone]        [facility] Sessions. Call on 07/07/2019.   Why: Please confirm your therapy and medication management appointments Contact information: [place]  [place] [address] [phone]           Next level of care provider has access to [place] and Suicide Prevention discussed: Yes,  2x attempts to reach aunt, [name].  Has patient been referred to the Quitline?: N/A patient is not a smoker  Patient has been referred for addiction treatment: Yes  Joellen Jersey, [place] 07/06/2019, 9:41 AM

## 2019-07-06 NOTE — Progress Notes (Signed)
RN met with pt and reviewed pt's discharge instructions.  Pt verbalized understanding of discharge instructions and pt did not have any questions. RN reviewed and provided copy of SRA, AVS and Transition Record.  RN returned pt's belongings to pt. Pt denied SI/HI/AVH and voiced no concerns. Pt was appreciative of the care pt received at [facility].  Patient discharged to the lobby without incident.

## 2019-07-06 NOTE — BHH Suicide Risk Assessment (Signed)
[place] INPATIENT:  Family/Significant Other Suicide Prevention Education  Suicide Prevention Education:  Contact Attempts: aunt, Harrel Carina [phone]) has been identified by the patient as the family member/significant other with whom the patient will be residing, and identified as the person(s) who will aid the patient in the event of a mental health crisis.  With written consent from the patient, two attempts were made to provide suicide prevention education, prior to and/or following the patient's discharge.  We were unsuccessful in providing suicide prevention education.  A suicide education pamphlet was given to the patient to share with family/significant other.  Date and time of first attempt: 07/06/2019 at 8:53am. Left VM. Date and time of second attempt: 07/06/2019 9:11am  SPE pamphlet placed on chart for patient to share with supports.   Joellen Jersey 07/06/2019, 9:13 AM

## 2019-07-06 NOTE — BHH Suicide Risk Assessment (Signed)
[place] INPATIENT:  Family/Significant Other Suicide Prevention Education  Suicide Prevention Education:  Contact Attempts: aunt, Harrel Carina [phone]) has been identified by the patient as the family member/significant other with whom the patient will be residing, and identified as the person(s) who will aid the patient in the event of a mental health crisis.  With written consent from the patient, two attempts were made to provide suicide prevention education, prior to and/or following the patient's discharge.  We were unsuccessful in providing suicide prevention education.  A suicide education pamphlet was given to the patient to share with family/significant other.  Date and time of first attempt: 07/06/2019 at 8:53am. Left VM. Date and time of second attempt: needs attempt  Joellen Jersey 07/06/2019, 8:54 AM

## 2019-07-07 ENCOUNTER — Emergency Department (HOSPITAL_COMMUNITY)
Admission: EM | Admit: 2019-07-07 | Discharge: 2019-07-08 | Disposition: A | Discharge status: Other Acute Inpatient Hospital | Payer: PPO | Attending: Emergency Medicine | Admitting: Emergency Medicine

## 2019-07-07 ENCOUNTER — Other Ambulatory Visit: Payer: Self-pay

## 2019-07-07 ENCOUNTER — Telehealth: Payer: Self-pay | Admitting: Neurology

## 2019-07-07 ENCOUNTER — Encounter (HOSPITAL_COMMUNITY): Payer: Self-pay

## 2019-07-07 DIAGNOSIS — Z03818 Encounter for observation for suspected exposure to other biological agents ruled out: Secondary | ICD-10-CM | POA: Diagnosis not present

## 2019-07-07 DIAGNOSIS — Z79899 Other long term (current) drug therapy: Secondary | ICD-10-CM | POA: Insufficient documentation

## 2019-07-07 DIAGNOSIS — Y929 Unspecified place or not applicable: Secondary | ICD-10-CM | POA: Diagnosis not present

## 2019-07-07 DIAGNOSIS — F4310 Post-traumatic stress disorder, unspecified: Secondary | ICD-10-CM | POA: Diagnosis not present

## 2019-07-07 DIAGNOSIS — J45909 Unspecified asthma, uncomplicated: Secondary | ICD-10-CM | POA: Diagnosis not present

## 2019-07-07 DIAGNOSIS — R45851 Suicidal ideations: Secondary | ICD-10-CM

## 2019-07-07 DIAGNOSIS — R58 Hemorrhage, not elsewhere classified: Secondary | ICD-10-CM | POA: Diagnosis not present

## 2019-07-07 DIAGNOSIS — F603 Borderline personality disorder: Secondary | ICD-10-CM | POA: Insufficient documentation

## 2019-07-07 DIAGNOSIS — Z87891 Personal history of nicotine dependence: Secondary | ICD-10-CM | POA: Insufficient documentation

## 2019-07-07 DIAGNOSIS — Y999 Unspecified external cause status: Secondary | ICD-10-CM | POA: Diagnosis not present

## 2019-07-07 DIAGNOSIS — X788XXA Intentional self-harm by other sharp object, initial encounter: Secondary | ICD-10-CM | POA: Insufficient documentation

## 2019-07-07 DIAGNOSIS — Z20822 Contact with and (suspected) exposure to covid-19: Secondary | ICD-10-CM | POA: Diagnosis not present

## 2019-07-07 DIAGNOSIS — T1491XA Suicide attempt, initial encounter: Secondary | ICD-10-CM | POA: Diagnosis present

## 2019-07-07 DIAGNOSIS — F250 Schizoaffective disorder, bipolar type: Secondary | ICD-10-CM | POA: Diagnosis not present

## 2019-07-07 DIAGNOSIS — Y9389 Activity, other specified: Secondary | ICD-10-CM | POA: Insufficient documentation

## 2019-07-07 DIAGNOSIS — R000 Tachycardia, unspecified: Secondary | ICD-10-CM | POA: Diagnosis not present

## 2019-07-07 LAB — CBC
HCT: 40.4 % (ref 36.0–46.0)
Hemoglobin: 13 g/dL (ref 12.0–15.0)
MCH: 27 pg (ref 26.0–34.0)
MCHC: 32.2 g/dL (ref 30.0–36.0)
MCV: 84 fL (ref 80.0–100.0)
Platelets: 271 10*3/uL (ref 150–400)
RBC: 4.81 MIL/uL (ref 3.87–5.11)
RDW: 13 % (ref 11.5–15.5)
WBC: 10.3 10*3/uL (ref 4.0–10.5)
nRBC: 0 % (ref 0.0–0.2)

## 2019-07-07 LAB — COMPREHENSIVE METABOLIC PANEL
ALT: 15 U/L (ref 0–44)
AST: 16 U/L (ref 15–41)
Albumin: 4.4 g/dL (ref 3.5–5.0)
Alkaline Phosphatase: 74 U/L (ref 38–126)
Anion gap: 13 (ref 5–15)
BUN: 14 mg/dL (ref 6–20)
CO2: 23 mmol/L (ref 22–32)
Calcium: 9.2 mg/dL (ref 8.9–10.3)
Chloride: 104 mmol/L (ref 98–111)
Creatinine, Ser: 0.72 mg/dL (ref 0.44–1.00)
GFR calc Af Amer: >60 mL/min (ref >60)
GFR calc non Af Amer: >60 mL/min (ref >60)
Glucose, Bld: 110 mg/dL — ABNORMAL HIGH (ref 70–99)
Potassium: 3.7 mmol/L (ref 3.5–5.1)
Sodium: 140 mmol/L (ref 135–145)
Total Bilirubin: 0.6 mg/dL (ref 0.3–1.2)
Total Protein: 7.5 g/dL (ref 6.5–8.1)

## 2019-07-07 LAB — I-STAT BETA HCG BLOOD, ED (MC, WL, AP ONLY): I-stat hCG, quantitative: <5 m[IU]/mL (ref <5)

## 2019-07-07 NOTE — Telephone Encounter (Signed)
Per pt pharmacist pt was told by the prescribe of her Zoloft that with the increase of the Zoloft and the Sumatriptan could cause Serotonin increase. Pt asked the pharmacist what should she do.  Per the Pharmacist she told the pt to as if we could decrease the Sumatriptan to 25 mg as needed or change to Propranolol.

## 2019-07-07 NOTE — Telephone Encounter (Signed)
Pt advised of Dr.Jaffe note below.  
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
## 2019-07-07 NOTE — Telephone Encounter (Signed)
Patient called in stating the pharmacist stated her "Amatrex" was too much serotonin with her other medications. She wanted to see if there was anything else she could try?

## 2019-07-07 NOTE — Telephone Encounter (Signed)
Risk of serotonin syndrome is low.  It's hard for both psychiatry and neurology to avoid prescribing any medication that can't potentially cause serotonin syndrome with a patient's other medications.  However, if she has a concern about it, I recommend discussing with her psychiatrist since they have made the change.

## 2019-07-08 ENCOUNTER — Other Ambulatory Visit: Payer: Self-pay | Admitting: Registered Nurse

## 2019-07-08 ENCOUNTER — Encounter (HOSPITAL_COMMUNITY): Payer: Self-pay | Admitting: Emergency Medicine

## 2019-07-08 ENCOUNTER — Inpatient Hospital Stay (HOSPITAL_COMMUNITY)
Admission: AD | Admit: 2019-07-08 | Discharge: 2019-07-13 | DRG: 885 | Disposition: A | Discharge status: Home / Self Care | Payer: PPO | Source: Intra-hospital | Attending: Psychiatry | Admitting: Psychiatry

## 2019-07-08 ENCOUNTER — Encounter (HOSPITAL_COMMUNITY): Payer: Self-pay | Admitting: Psychiatry

## 2019-07-08 DIAGNOSIS — Z79899 Other long term (current) drug therapy: Secondary | ICD-10-CM | POA: Diagnosis not present

## 2019-07-08 DIAGNOSIS — K219 Gastro-esophageal reflux disease without esophagitis: Secondary | ICD-10-CM | POA: Diagnosis present

## 2019-07-08 DIAGNOSIS — F840 Autistic disorder: Secondary | ICD-10-CM | POA: Diagnosis not present

## 2019-07-08 DIAGNOSIS — S51812D Laceration without foreign body of left forearm, subsequent encounter: Secondary | ICD-10-CM

## 2019-07-08 DIAGNOSIS — Z825 Family history of asthma and other chronic lower respiratory diseases: Secondary | ICD-10-CM

## 2019-07-08 DIAGNOSIS — F4310 Post-traumatic stress disorder, unspecified: Secondary | ICD-10-CM | POA: Diagnosis not present

## 2019-07-08 DIAGNOSIS — F250 Schizoaffective disorder, bipolar type: Secondary | ICD-10-CM | POA: Diagnosis not present

## 2019-07-08 DIAGNOSIS — X788XXD Intentional self-harm by other sharp object, subsequent encounter: Secondary | ICD-10-CM | POA: Diagnosis present

## 2019-07-08 DIAGNOSIS — Z148 Genetic carrier of other disease: Secondary | ICD-10-CM | POA: Diagnosis not present

## 2019-07-08 DIAGNOSIS — Z818 Family history of other mental and behavioral disorders: Secondary | ICD-10-CM

## 2019-07-08 DIAGNOSIS — J45909 Unspecified asthma, uncomplicated: Secondary | ICD-10-CM | POA: Diagnosis present

## 2019-07-08 DIAGNOSIS — F845 Asperger's syndrome: Secondary | ICD-10-CM | POA: Diagnosis present

## 2019-07-08 DIAGNOSIS — F332 Major depressive disorder, recurrent severe without psychotic features: Secondary | ICD-10-CM | POA: Diagnosis present

## 2019-07-08 DIAGNOSIS — F603 Borderline personality disorder: Secondary | ICD-10-CM | POA: Diagnosis present

## 2019-07-08 DIAGNOSIS — R45851 Suicidal ideations: Secondary | ICD-10-CM | POA: Diagnosis not present

## 2019-07-08 LAB — RAPID URINE DRUG SCREEN, HOSP PERFORMED
Amphetamines: NOT DETECTED
Barbiturates: NOT DETECTED
Benzodiazepines: NOT DETECTED
Cocaine: NOT DETECTED
Opiates: NOT DETECTED
Tetrahydrocannabinol: NOT DETECTED

## 2019-07-08 LAB — ACETAMINOPHEN LEVEL: Acetaminophen (Tylenol), Serum: <10 ug/mL — ABNORMAL LOW (ref 10–30)

## 2019-07-08 LAB — ETHANOL: Alcohol, Ethyl (B): <10 mg/dL (ref <10)

## 2019-07-08 LAB — SALICYLATE LEVEL: Salicylate Lvl: <7 mg/dL — ABNORMAL LOW (ref 7.0–30.0)

## 2019-07-08 LAB — SARS CORONAVIRUS 2 BY RT PCR (HOSPITAL ORDER, PERFORMED IN ~~LOC~~ HOSPITAL LAB): SARS Coronavirus 2: NEGATIVE

## 2019-07-08 MED ORDER — ALBUTEROL SULFATE HFA 108 (90 BASE) MCG/ACT IN AERS
1.0000 | INHALATION_SPRAY | Freq: Four times a day (QID) | RESPIRATORY_TRACT | Status: DC | PRN
  Administered 2019-07-09 – 2019-07-10 (×2): 2 via RESPIRATORY_TRACT
  Filled 2019-07-08 (×2): qty 6.7

## 2019-07-08 MED ORDER — RISPERIDONE 2 MG PO TABS: 3.0000 mg | ORAL_TABLET | Freq: Every day | ORAL | Status: DC

## 2019-07-08 MED ORDER — BUSPIRONE HCL 10 MG PO TABS
5.0000 mg | ORAL_TABLET | Freq: Three times a day (TID) | ORAL | Status: DC
  Administered 2019-07-08 (×2): 5 mg via ORAL
  Filled 2019-07-08 (×2): qty 1

## 2019-07-08 MED ORDER — MOMETASONE FURO-FORMOTEROL FUM 200-5 MCG/ACT IN AERO
2.0000 | INHALATION_SPRAY | Freq: Two times a day (BID) | RESPIRATORY_TRACT | Status: DC
  Administered 2019-07-08: 2 via RESPIRATORY_TRACT
  Filled 2019-07-08: qty 8.8

## 2019-07-08 MED ORDER — BUSPIRONE HCL 5 MG PO TABS
5.0000 mg | ORAL_TABLET | Freq: Three times a day (TID) | ORAL | Status: DC
  Administered 2019-07-08 – 2019-07-09 (×2): 5 mg via ORAL
  Filled 2019-07-08 (×3): qty 1

## 2019-07-08 MED ORDER — RISPERIDONE 2 MG PO TABS
2.0000 mg | ORAL_TABLET | Freq: Every morning | ORAL | Status: DC
  Administered 2019-07-09 – 2019-07-13 (×5): 2 mg via ORAL
  Filled 2019-07-08 (×6): qty 1

## 2019-07-08 MED ORDER — RISPERIDONE 2 MG PO TABS
2.0000 mg | ORAL_TABLET | Freq: Every morning | ORAL | Status: DC
  Administered 2019-07-08: 2 mg via ORAL
  Filled 2019-07-08: qty 1

## 2019-07-08 MED ORDER — MOMETASONE FURO-FORMOTEROL FUM 200-5 MCG/ACT IN AERO
2.0000 | INHALATION_SPRAY | Freq: Two times a day (BID) | RESPIRATORY_TRACT | Status: DC
  Administered 2019-07-09 – 2019-07-13 (×8): 2 via RESPIRATORY_TRACT
  Filled 2019-07-08 (×2): qty 8.8

## 2019-07-08 MED ORDER — ALBUTEROL SULFATE HFA 108 (90 BASE) MCG/ACT IN AERS: 1.0000 | INHALATION_SPRAY | Freq: Four times a day (QID) | RESPIRATORY_TRACT | Status: DC | PRN

## 2019-07-08 MED ORDER — BACITRACIN-NEOMYCIN-POLYMYXIN OINTMENT TUBE
TOPICAL_OINTMENT | CUTANEOUS | Status: DC | PRN
  Administered 2019-07-13: 1 via TOPICAL
  Filled 2019-07-08: qty 14.17

## 2019-07-08 MED ORDER — RISPERIDONE 3 MG PO TABS
3.0000 mg | ORAL_TABLET | Freq: Every day | ORAL | Status: DC
  Administered 2019-07-08 – 2019-07-12 (×5): 3 mg via ORAL
  Filled 2019-07-08 (×5): qty 1
  Filled 2019-07-08: qty 3

## 2019-07-08 MED ORDER — SERTRALINE HCL 50 MG PO TABS
100.0000 mg | ORAL_TABLET | Freq: Every day | ORAL | Status: DC
  Administered 2019-07-08: 100 mg via ORAL
  Filled 2019-07-08: qty 2

## 2019-07-08 MED ORDER — TRAZODONE HCL 100 MG PO TABS
100.0000 mg | ORAL_TABLET | Freq: Every evening | ORAL | Status: DC | PRN
  Administered 2019-07-08: 100 mg via ORAL
  Filled 2019-07-08: qty 1

## 2019-07-08 MED ORDER — HYDROXYZINE HCL 25 MG PO TABS
25.0000 mg | ORAL_TABLET | Freq: Four times a day (QID) | ORAL | Status: DC | PRN
  Administered 2019-07-09: 25 mg via ORAL
  Filled 2019-07-08 (×2): qty 1

## 2019-07-08 MED ORDER — PANTOPRAZOLE SODIUM 40 MG PO TBEC
40.0000 mg | DELAYED_RELEASE_TABLET | Freq: Every day | ORAL | Status: DC
  Administered 2019-07-08 – 2019-07-13 (×6): 40 mg via ORAL
  Filled 2019-07-08 (×7): qty 1

## 2019-07-08 MED ORDER — BACITRACIN ZINC 500 UNIT/GM EX OINT
TOPICAL_OINTMENT | Freq: Two times a day (BID) | CUTANEOUS | Status: DC
  Administered 2019-07-08: 1 via TOPICAL
  Filled 2019-07-08: qty 0.9

## 2019-07-08 MED ORDER — RISPERIDONE 2 MG PO TABS
3.0000 mg | ORAL_TABLET | ORAL | Status: DC
  Filled 2019-07-08: qty 1

## 2019-07-08 MED ORDER — BACITRACIN ZINC 500 UNIT/GM EX OINT
TOPICAL_OINTMENT | CUTANEOUS | Status: AC
  Filled 2019-07-08: qty 0.9

## 2019-07-08 MED ORDER — TRAZODONE HCL 50 MG PO TABS
50.0000 mg | ORAL_TABLET | Freq: Once | ORAL | Status: AC
  Administered 2019-07-09: 50 mg via ORAL
  Filled 2019-07-08: qty 1

## 2019-07-08 MED ORDER — SERTRALINE HCL 100 MG PO TABS
100.0000 mg | ORAL_TABLET | Freq: Every day | ORAL | Status: DC
  Administered 2019-07-09: 100 mg via ORAL
  Filled 2019-07-08: qty 1

## 2019-07-08 MED ORDER — ALUM & MAG HYDROXIDE-SIMETH 200-200-20 MG/5ML PO SUSP: 30.0000 mL | Freq: Four times a day (QID) | ORAL | Status: DC | PRN

## 2019-07-08 MED ORDER — IBUPROFEN 600 MG PO TABS
600.0000 mg | ORAL_TABLET | Freq: Once | ORAL | Status: AC
  Administered 2019-07-09: 600 mg via ORAL
  Filled 2019-07-08: qty 1
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
## 2019-07-08 NOTE — Progress Notes (Signed)
Patient currently in bed sleeping soundly. No sign of distress. Safety monitored as recommended.

## 2019-07-08 NOTE — Progress Notes (Addendum)
Patient woke up reporting that "I am anxious..very anxious". No correlated symptoms noted. Patient is alert and oriented. Sitting in bed, eating a snack, calm. Patient continues to report that she is suicidal but contracts for safety. Staff continue to monitor.
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
## 2019-07-08 NOTE — ED Provider Notes (Addendum)
[facility] DEPT Provider Note   CSN: [id number] Arrival date & time: 07/07/19  2303     History Chief Complaint  Patient presents with  . Suicidal    [name] is a 29 y.o. female.  The history is provided by the patient.  Mental Health Problem Presenting symptoms: suicidal thoughts and suicide attempt   Patient accompanied by: none. Degree of incapacity (severity):  Moderate Onset quality:  Gradual Duration:  1 day Timing:  Constant Progression:  Worsening Chronicity:  Recurrent Context: not stressful life event   Treatment compliance:  All of the time Relieved by:  Nothing Worsened by:  Nothing Ineffective treatments:  None tried Associated symptoms: no abdominal pain, no anhedonia, no anxiety, no appetite change, no chest pain, no decreased need for sleep, not distractible, no euphoric mood, no fatigue, no feelings of worthlessness, no headaches, no hypersomnia, no hyperventilation, no insomnia, no irritability, no poor judgment, no school problems and no weight change   Risk factors: hx of mental illness   Patient has superficial cuts to the forearms. Planned to cut self.  Tetanus is UTD.       Past Medical History:  Diagnosis Date  . Acid reflux   . Anxiety   . Asthma    last attack 03/13/15 or 03/14/15  . Autism   . 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 Chronic constipation   . Depression   . Drug-seeking behavior   . Essential tremor   . Headache   . Overdose of acetaminophen 07/2017   and other meds  . Personality disorder ([place])   . Schizo-affective psychosis ([place])   . Schizoaffective disorder, bipolar type ([place])   . Seizures [facility])    Last seizure December 2017  . Sleep apnea     Patient Active Problem List   Diagnosis Date Noted  . Bipolar I disorder, most recent episode depressed ([place]) 07/03/2019  . MDD (major depressive disorder), recurrent severe, without psychosis ([place]) 07/01/2019  . Non-suicidal self harm  06/24/2019  . Discord with neighbors, lodgers and landlord 03/16/2019  . Bipolar I disorder, most recent episode (or current) manic ([place]) 03/05/2019  . Desire for pregnancy 07/03/2018  . Drug overdose 07/22/2017  . Overdose 07/22/2017  . Intentional acetaminophen overdose ([place])   . Metabolic acidosis   . DUB (dysfunctional uterine bleeding) 11/22/2016  . Hyperprolactinemia ([place]) 08/20/2016  . Tachycardia 01/13/2016  . 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  . Seizure disorder ([place]) 08/08/2015  . Chronic migraine 07/27/2015  . Asthma 04/15/2015  . Schizoaffective disorder, bipolar type ([place]) 03/10/2014  . Borderline personality disorder ([place]) 03/10/2014  . PTSD (post-traumatic stress disorder) 03/10/2014  . Suicidal ideations   . Borderline personality disorder ([place]) 10/31/2013  . Autism spectrum disorder 06/15/2013    Past Surgical History:  Procedure Laterality Date  . MOUTH SURGERY  2009 or 2010     OB History    Gravida  2   Para  1   Term  1   Preterm      AB  1   Living  1     SAB  1   TAB      Ectopic      Multiple  0   Live Births  1           Family History  Problem Relation Age of Onset  . Mental illness Father   . Asthma Father   . PDD Brother   .  Seizures Brother     Social History   Tobacco Use  . Smoking status: Former Smoker    Packs/day: 0.00  . Smokeless tobacco: Never Used  . Tobacco comment: Smoked for 2  years age [age]  Substance Use Topics  . Alcohol use: No    Alcohol/week: 1.0 standard drinks    Types: 1 Standard drinks or equivalent per week  . Drug use: No    Comment: History of cocaine use at age 26 for 4 months    Home Medications Prior to Admission medications   Medication Sig Start Date End Date Taking? Authorizing Provider  albuterol (PROVENTIL HFA;VENTOLIN HFA) 108 (90 Base) MCG/ACT inhaler Inhale 1-2 puffs into the lungs every 6 (six) hours as needed for wheezing or shortness of breath.   Yes  [provider]  busPIRone (BUSPAR) 5 MG tablet Take 1 tablet (5 mg total) by mouth 3 (three) times daily. 07/06/19  Yes Connye Burkitt, NP  famotidine (PEPCID) 10 MG tablet Take 10 mg by mouth daily.   Yes [provider]  fluticasone-salmeterol (ADVAIR HFA) 115-21 MCG/ACT inhaler Inhale 2 puffs into the lungs 2 (two) times daily. 08/03/15  Yes Timmothy Euler, MD  hydrOXYzine (ATARAX/VISTARIL) 25 MG tablet Take 1 tablet (25 mg total) by mouth every 6 (six) hours as needed for anxiety. 07/06/19  Yes Connye Burkitt, NP  pantoprazole (PROTONIX) 40 MG tablet Take 1 tablet (40 mg total) by mouth daily. 06/16/19  Yes Lajean Saver, MD  risperiDONE (RISPERDAL) 2 MG tablet Take 1 tablet (2 mg total) by mouth in the morning. 07/06/19  Yes Connye Burkitt, NP  risperiDONE (RISPERDAL) 3 MG tablet Take 1 tablet (3 mg total) by mouth at bedtime. 07/06/19  Yes Connye Burkitt, NP  sertraline (ZOLOFT) 100 MG tablet Take 1 tablet (100 mg total) by mouth daily. 07/07/19  Yes Connye Burkitt, NP  traZODone (DESYREL) 100 MG tablet Take 1 tablet (100 mg total) by mouth at bedtime as needed and may repeat dose one time if needed for sleep. 07/06/19  Yes Connye Burkitt, NP    Allergies    Bee venom, Coconut flavor, Geodon [ziprasidone hcl], Haloperidol and related, Lithium, Oxycodone, Quetiapine, Seroquel [quetiapine fumarate], Shellfish allergy, Phenergan [promethazine hcl], Prilosec [omeprazole], Sulfa antibiotics, and Tegretol [carbamazepine]  Review of Systems   Review of Systems  Constitutional: Negative for appetite change, fatigue and irritability.  HENT: Negative for congestion.   Eyes: Negative for visual disturbance.  Respiratory: Negative for apnea.   Cardiovascular: Negative for chest pain.  Gastrointestinal: Negative for abdominal pain.  Genitourinary: Negative for difficulty urinating.  Musculoskeletal: Negative for arthralgias.  Skin: Negative for color change.  Neurological: Negative  for headaches.  Psychiatric/Behavioral: Positive for suicidal ideas. The patient is not nervous/anxious and does not have insomnia.   All other systems reviewed and are negative.   Physical Exam Updated Vital Signs BP 115/79 (BP Location: Right Arm)   Pulse 98   Temp 97.8 F (36.6 C) (Oral)   Resp 18   SpO2 99%   Physical Exam Vitals and nursing note reviewed.  Constitutional:      General: She is not in acute distress.    Appearance: Normal appearance.  HENT:     Head: Normocephalic and atraumatic.     Nose: Nose normal.     Mouth/Throat:     Mouth: Mucous membranes are moist.     Pharynx: Oropharynx is clear.  Eyes:  Pupils: Pupils are equal, round, and reactive to light.  Cardiovascular:     Rate and Rhythm: Normal rate and regular rhythm.     Pulses: Normal pulses.     Heart sounds: Normal heart sounds.  Pulmonary:     Effort: Pulmonary effort is normal.     Breath sounds: Normal breath sounds.  Abdominal:     General: Abdomen is flat. Bowel sounds are normal.     Tenderness: There is no abdominal tenderness.  Musculoskeletal:        General: No swelling or tenderness. Normal range of motion.     Cervical back: Normal range of motion and neck supple.     Right knee: Normal.     Left knee: Normal.  Skin:    General: Skin is warm and dry.     Capillary Refill: Capillary refill takes less than 2 seconds.  Neurological:     General: No focal deficit present.     Mental Status: She is alert and oriented to person, place, and time.     Deep Tendon Reflexes: Reflexes normal.  Psychiatric:        Thought Content: Thought content includes suicidal ideation. Thought content includes suicidal plan.     ED Results / Procedures / Treatments   Labs (all labs ordered are listed, but only abnormal  Results for orders placed or performed during the hospital encounter of 07/07/19  SARS Coronavirus 2 by RT PCR (hospital order, performed in [facility] hospital lab)  Nasopharyngeal Nasopharyngeal Swab   Specimen: Nasopharyngeal Swab  Result Value Ref Range   SARS Coronavirus 2 NEGATIVE NEGATIVE  Comprehensive metabolic panel  Result Value Ref Range   Sodium 140 135 - 145 mmol/L   Potassium 3.7 3.5 - 5.1 mmol/L   Chloride 104 98 - 111 mmol/L   CO2 23 22 - 32 mmol/L   Glucose, Bld 110 (H) 70 - 99 mg/dL   BUN 14 6 - 20 mg/dL   Creatinine, Ser 0.72 0.44 - 1.00 mg/dL   Calcium 9.2 8.9 - 10.3 mg/dL   Total Protein 7.5 6.5 - 8.1 g/dL   Albumin 4.4 3.5 - 5.0 g/dL   AST 16 15 - 41 U/L   ALT 15 0 - 44 U/L   Alkaline Phosphatase 74 38 - 126 U/L   Total Bilirubin 0.6 0.3 - 1.2 mg/dL   GFR calc non Af Amer >60 >60 mL/min   GFR calc Af Amer >60 >60 mL/min   Anion gap 13 5 - 15  Ethanol  Result Value Ref Range   Alcohol, Ethyl (B) Q000111Q Q000111Q mg/dL  Salicylate level  Result Value Ref Range   Salicylate Lvl Q000111Q (L) 7.0 - 30.0 mg/dL  Acetaminophen level  Result Value Ref Range   Acetaminophen (Tylenol), Serum <10 (L) 10 - 30 ug/mL  cbc  Result Value Ref Range   WBC 10.3 4.0 - 10.5 K/uL   RBC 4.81 3.87 - 5.11 MIL/uL   Hemoglobin 13.0 12.0 - 15.0 g/dL   HCT 40.4 36.0 - 46.0 %   MCV 84.0 80.0 - 100.0 fL   MCH 27.0 26.0 - 34.0 pg   MCHC 32.2 30.0 - 36.0 g/dL   RDW 13.0 11.5 - 15.5 %   Platelets 271 150 - 400 K/uL   nRBC 0.0 0.0 - 0.2 %  I-Stat beta hCG blood, ED  Result Value Ref Range   I-stat hCG, quantitative <5.0 <5 mIU/mL   Comment 3  US Abdomen Limited  Result Date: 06/16/2019 CLINICAL DATA:  Nausea, vomiting, right upper quadrant pain EXAM: ULTRASOUND ABDOMEN LIMITED RIGHT UPPER QUADRANT COMPARISON:  CT 11/30/2018 FINDINGS: Gallbladder: No gallstones or wall thickening visualized. No sonographic Murphy sign noted by sonographer. Common bile duct: Diameter: Normal caliber, 3 mm. Liver: Slight increased echotexture suggests early fatty infiltration. No focal hepatic abnormality. Portal vein is patent on color Doppler imaging with normal  direction of blood flow towards the liver. Other: None. IMPRESSION: Probable early fatty infiltration of the liver. No acute findings. Electronically Signed   By: Rolm Baptise M.D.   On: 06/16/2019 13:36   EKG None  Radiology No results found.  Procedures Procedures (including critical care time)  Medications Ordered in ED Medications  risperiDONE (RISPERDAL) tablet 3 mg (has no administration in time range)    ED Course  I have reviewed the triage vital signs and the nursing notes.  Pertinent labs & imaging results that were available during my care of the patient were reviewed by me and considered in my medical decision making (see chart for details).    The patient has been placed in psychiatric observation due to the need to provide a safe environment for the patient while obtaining psychiatric consultation and evaluation, as well as ongoing medical and medication management to treat the patient's condition.  The patient has not been placed under full IVC at this time.  Final Clinical Impression(s) / ED Diagnoses Medically cleared by me, orders placed .    Jacqueline Spofford, MD 07/08/19 [id number]    Veatrice Kells, MD 07/08/19 [id number]

## 2019-07-08 NOTE — Plan of Care (Signed)
[facility] Observation Crisis Plan  Reason for Crisis Plan:  Crisis Stabilization   Plan of Care:  Referral for Telepsychiatry/Psychiatric Consult  Family Support:      Current Living Environment:  Living Arrangements: Alone  Insurance:   Hospital Account    Name Acct ID Class Status Primary Coverage   [name], [name] [id number] [place] PPO        Guarantor Account (for Hospital Account [id number])    Name Relation to Pt Service Area Active? Acct Type   [name] [facility] Yes Behavioral Health   Address Phone       [address] [place], [place] [address] [phone])          Coverage Information (for Hospital Account [id number])    1. [employer] ADVANTAGE/HEALTH[employer] ADVANTAGE PPO    F/O Payor/Plan Precert #   [employer] ADVANTAGE/HEALTHTEAM ADVANTAGE PPO    Subscriber Subscriber #   Evelyn, Ward [id number]   Address Phone   [address] [place] [place] [address] [phone]       2. [facility] MEDICAID/[place] MEDICAID    F/O Payor/Plan Precert #   [facility] MEDICAID/[place] MEDICAID    Subscriber Subscriber #   Evelyn, Ward [id number]   Address Phone   [address] [place], [place] [address] [phone]          Legal Guardian:     Primary Care Provider:  Beverly Ward  Current Outpatient Providers:  unsure  Psychiatrist:     Counselor/Therapist:     Compliant with Medications:  Yes  Additional Information:   [name] 6/2/20215:43 PM

## 2019-07-08 NOTE — BH Assessment (Addendum)
[facility] Assessment Progress Note  Per Shuvon Rankin, FNP, this pt would benefit from admission to the [facility] Observation Unit at this time.  Jasmine has assigned pt to [facility] Rm 202-1; they will be ready to receive pt at 15:00.  Pt has signed Voluntary Admission and Consent for Treatment, as well as Consent to Release Information to her CST provider at [facility] in [place], and a notification call has been placed.  Signed forms have been faxed to [facility].  Pt's nurse has been notified, and agrees to send original paperwork along with pt via [employer], and to call report to [phone] or [phone].  [name], [place] [facility] Coordinator [phone]   Addendum:  At 15:42 I spoke to Rudolpho Sevin [phone] with the [facility] Team, notifying her of pt's disposition.  [name], [place] Coordinator [phone]

## 2019-07-08 NOTE — H&P (Signed)
[facility] Provider Admission PAA/H&P  Patient Identification: Selam Atehortua MRN:  [id number] Date of Evaluation:  07/08/2019 Chief Complaint:  Schizoaffective Disorder Bipolar type Boderline personality Disorder  Ptsd Principal Diagnosis: <principal problem not specified> Diagnosis:  Active Problems:   Borderline personality disorder ([place])   Autism spectrum disorder   Suicidal ideations   Schizoaffective disorder, bipolar type ([place])   PTSD (post-traumatic stress disorder)  History of Present Illness: [name], 29 y.o., female patient seen via tele psych by this provider, Dr. Dwyane Dee; and chart reviewed on 07/08/19 while at [facility] prior to transfer to [place] observation unit.  On evaluation Amai Whitton reports she is having suicidal thoughts and unable to contract for safety.  States that she has been under a lot of stress related to the visitation of her children.  States that they are currently living with there paternal grandmother and she would have to pay an agency 25 dollars for each visit which she is unable to pay.  Patient states she also self-harm cutting her left forearm superficially no stiches.   During evaluation Betsie Heimberger is alert/oriented x 4; calm/cooperative; and mood is congruent with affect.  He/She does not appear to be responding to internal/external stimuli or delusional thoughts.   Associated Signs/Symptoms: Depression Symptoms:  depressed mood, suicidal thoughts without plan, anxiety, (Hypo) Manic Symptoms:  Impulsivity, Labiality of Mood, Anxiety Symptoms:  Excessive Worry, Psychotic Symptoms:  Denies PTSD Symptoms: NA Total Time spent with patient: 30 minutes  Past Psychiatric History: See above  Is the patient at risk to self? Yes.    Has the patient been a risk to self in the past 6 months? Yes.    Has the patient been a risk to self within the distant past? Yes.    Is the patient a risk to others? No.   Has the patient been a risk to others in the past 6 months? No.  Has the patient been a risk to others within the distant past? No.   Prior Inpatient Therapy:  Yes Prior Outpatient Therapy:  Yes  Alcohol Screening:   Substance Abuse History in the last 12 months:  No. Consequences of Substance Abuse: NA Previous Psychotropic Medications: Yes  Psychological Evaluations: Yes  Past Medical History:  Past Medical History:  Diagnosis Date  . Acid reflux   . Anxiety   . Asthma    last attack 03/13/15 or 03/14/15  . Autism   . 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 Chronic constipation   . Depression   . Drug-seeking behavior   . Essential tremor   . Headache   . Overdose of acetaminophen 07/2017   and other meds  . Personality disorder ([place])   . Schizo-affective psychosis ([place])   . Schizoaffective disorder, bipolar type ([place])   . Seizures [facility])    Last seizure December 2017  . Sleep apnea     Past Surgical History:  Procedure Laterality Date  . MOUTH SURGERY  2009 or 2010   Family History:  Family History  Problem Relation Age of Onset  . Mental illness Father   . Asthma Father   . PDD Brother   . Seizures Brother    Family Psychiatric History: see above Tobacco Screening:   Social History:  Social History   Substance and Sexual Activity  Alcohol Use No  . Alcohol/week: 1.0 standard drinks  . Types: 1 Standard drinks or equivalent per week     Social  History   Substance and Sexual Activity  Drug Use No   Comment: History of cocaine use at age 78 for 4 months    Additional Social History:                           Allergies:   Allergies  Allergen Reactions  . Bee Venom Anaphylaxis  . Coconut Flavor Anaphylaxis and Rash  . Geodon [Ziprasidone Hcl] Other (See Comments)    Pt states that this medication causes paralysis of the mouth.    . Haloperidol And Related Other (See Comments)    Pt states that this medication causes paralysis of the mouth.     . Lithium Other (See Comments)    Reaction:  Seizure-like activity.    . Oxycodone Other (See Comments)    Reaction:  Hallucinations   . Quetiapine Other (See Comments)    Pt states that this medication is too strong.   . Seroquel [Quetiapine Fumarate] Other (See Comments)    Pt states that this medication is too strong.   . Shellfish Allergy Anaphylaxis  . Phenergan [Promethazine Hcl] Other (See Comments)    Reaction:  Chest pain    . Prilosec [Omeprazole] Nausea And Vomiting  . Sulfa Antibiotics Other (See Comments)    Chest pain   . Tegretol [Carbamazepine] Nausea And Vomiting   Lab Results:  Results for orders placed or performed during the hospital encounter of 07/07/19 (from the past 48 hour(s))  Comprehensive metabolic panel     Status: Abnormal   Collection Time: 07/07/19 11:28 PM  Result Value Ref Range   Sodium 140 135 - 145 mmol/L   Potassium 3.7 3.5 - 5.1 mmol/L   Chloride 104 98 - 111 mmol/L   CO2 23 22 - 32 mmol/L   Glucose, Bld 110 (H) 70 - 99 mg/dL    Comment: Glucose reference range applies only to samples taken after fasting for at least 8 hours.   BUN 14 6 - 20 mg/dL   Creatinine, Ser 0.72 0.44 - 1.00 mg/dL   Calcium 9.2 8.9 - 10.3 mg/dL   Total Protein 7.5 6.5 - 8.1 g/dL   Albumin 4.4 3.5 - 5.0 g/dL   AST 16 15 - 41 U/L   ALT 15 0 - 44 U/L   Alkaline Phosphatase 74 38 - 126 U/L   Total Bilirubin 0.6 0.3 - 1.2 mg/dL   GFR calc non Af Amer >60 >60 mL/min   GFR calc Af Amer >60 >60 mL/min   Anion gap 13 5 - 15    Comment: Performed at [facility], [place] [address], [place], [place] [address]  Ethanol     Status: None   Collection Time: 07/07/19 11:28 PM  Result Value Ref Range   Alcohol, Ethyl (B) <10 <10 mg/dL    Comment: (NOTE) Lowest detectable limit for serum alcohol is 10 mg/dL. For medical purposes only. Performed at [facility], [place] [address], [place], [place] [id number]   Salicylate level     Status:  Abnormal   Collection Time: 07/07/19 11:28 PM  Result Value Ref Range   Salicylate Lvl [id number] (L) 7.0 - 30.0 mg/dL    Comment: Performed at [facility], [place] [address] [place],  [address]  Acetaminophen level     Status: Abnormal   Collection Time: 07/07/19 11:28 PM  Result Value Ref Range   Acetaminophen (Tylenol), Serum <10 (L) 10 -  30 ug/mL    Comment: (NOTE) Therapeutic concentrations vary significantly. A range of 10-30 ug/mL  may be an effective concentration for many patients. However, some  are best treated at concentrations outside of this range. Acetaminophen concentrations >150 ug/mL at 4 hours after ingestion  and >50 ug/mL at 12 hours after ingestion are often associated with  toxic reactions. Performed at [facility], [place] [address], [place], [place] [address]   cbc     Status: None   Collection Time: 07/07/19 11:28 PM  Result Value Ref Range   WBC 10.3 4.0 - 10.5 K/uL   RBC 4.81 3.87 - 5.11 MIL/uL   Hemoglobin 13.0 12.0 - 15.0 g/dL   HCT 40.4 36.0 - 46.0 %   MCV 84.0 80.0 - 100.0 fL   MCH 27.0 26.0 - 34.0 pg   MCHC 32.2 30.0 - 36.0 g/dL   RDW 13.0 11.5 - 15.5 %   Platelets 271 150 - 400 K/uL   nRBC 0.0 0.0 - 0.2 %    Comment: Performed at [facility], [place] [address] [place], [place] [address]  I-Stat beta hCG blood, ED     Status: None   Collection Time: 07/07/19 11:33 PM  Result Value Ref Range   I-stat hCG, quantitative <5.0 <5 mIU/mL   Comment 3            Comment:   GEST. AGE      CONC.  (mIU/mL)   <=1 WEEK        5 - 50     2 WEEKS       50 - 500     3 WEEKS       100 - 10,000     4 WEEKS     1,000 - 30,000        FEMALE AND NON-PREGNANT FEMALE:     LESS THAN 5 mIU/mL   SARS Coronavirus 2 by RT PCR (hospital order, performed in [facility] hospital lab) Nasopharyngeal Nasopharyngeal Swab     Status: None   Collection Time: 07/08/19 12:19 AM   Specimen: Nasopharyngeal Swab  Result  Value Ref Range   SARS Coronavirus 2 NEGATIVE NEGATIVE    Comment: (NOTE) SARS-CoV-2 target nucleic acids are NOT DETECTED. The SARS-CoV-2 RNA is generally detectable in upper and lower respiratory specimens during the acute phase of infection. The lowest concentration of SARS-CoV-2 viral copies this assay can detect is 250 copies / mL. A negative result does not preclude SARS-CoV-2 infection and should not be used as the sole basis for treatment or other patient management decisions.  A negative result may occur with improper specimen collection / handling, submission of specimen other than nasopharyngeal swab, presence of viral mutation(s) within the areas targeted by this assay, and inadequate number of viral copies (<250 copies / mL). A negative result must be combined with clinical observations, patient history, and epidemiological information. Fact Sheet for Patients:   StrictlyIdeas.no Fact Sheet for Healthcare Providers: BankingDealers.co.za This test is not yet approved or cleared  by the [place] FDA and has been authorized for detection and/or diagnosis of SARS-CoV-2 by FDA under an Emergency Use Authorization (EUA).  This EUA will remain in effect (meaning this test can be used) for the duration of the COVID-19 declaration under Section 564(b)(1) of the Act, 21 U.S.C. section 360bbb-3(b)(1), unless the authorization is terminated or revoked sooner. Performed at [facility], [place] [address] [place], [place] [address]  Rapid urine drug screen (hospital performed)     Status: None   Collection Time: 07/08/19  3:00 AM  Result Value Ref Range   Opiates NONE DETECTED NONE DETECTED   Cocaine NONE DETECTED NONE DETECTED   Benzodiazepines NONE DETECTED NONE DETECTED   Amphetamines NONE DETECTED NONE DETECTED   Tetrahydrocannabinol NONE DETECTED NONE DETECTED   Barbiturates NONE DETECTED NONE DETECTED     Comment: (NOTE) DRUG SCREEN FOR MEDICAL PURPOSES ONLY.  IF CONFIRMATION IS NEEDED FOR ANY PURPOSE, NOTIFY LAB WITHIN 5 DAYS. LOWEST DETECTABLE LIMITS FOR URINE DRUG SCREEN Drug Class                     Cutoff (ng/mL) Amphetamine and metabolites    1000 Barbiturate and metabolites    200 Benzodiazepine                 [id number] Tricyclics and metabolites     300 Opiates and metabolites        300 Cocaine and metabolites        300 THC                            50 Performed at [facility], [place] [address], [place],  [address]     Blood Alcohol level:  Lab Results  Component Value Date   ETH <10 07/07/2019   ETH <10 [id number]    Metabolic Disorder Labs:  Lab Results  Component Value Date   HGBA1C 5.0 07/02/2019   MPG 96.8 07/02/2019   MPG 91.06 03/06/2019   Lab Results  Component Value Date   PROLACTIN 129.0 (H) 07/25/2017   PROLACTIN 194.7 (H) 11/22/2016   Lab Results  Component Value Date   CHOL 192 07/02/2019   TRIG 131 07/02/2019   HDL 44 07/02/2019   CHOLHDL 4.4 07/02/2019   VLDL 26 07/02/2019   LDLCALC 122 (H) 07/02/2019   LDLCALC 92 03/06/2019    Current Medications: No current facility-administered medications for this encounter.   Current Outpatient Medications  Medication Sig Dispense Refill  . albuterol (PROVENTIL HFA;VENTOLIN HFA) 108 (90 Base) MCG/ACT inhaler Inhale 1-2 puffs into the lungs every 6 (six) hours as needed for wheezing or shortness of breath.    . busPIRone (BUSPAR) 5 MG tablet Take 1 tablet (5 mg total) by mouth 3 (three) times daily. 90 tablet 0  . famotidine (PEPCID) 10 MG tablet Take 10 mg by mouth daily.    . fluticasone-salmeterol (ADVAIR HFA) 115-21 MCG/ACT inhaler Inhale 2 puffs into the lungs 2 (two) times daily. 1 Inhaler 5  . hydrOXYzine (ATARAX/VISTARIL) 25 MG tablet Take 1 tablet (25 mg total) by mouth every 6 (six) hours as needed for anxiety. 30 tablet 0  . pantoprazole (PROTONIX) 40 MG tablet  Take 1 tablet (40 mg total) by mouth daily. 30 tablet 0  . risperiDONE (RISPERDAL) 2 MG tablet Take 1 tablet (2 mg total) by mouth in the morning. 30 tablet 0  . risperiDONE (RISPERDAL) 3 MG tablet Take 1 tablet (3 mg total) by mouth at bedtime. 30 tablet 0  . sertraline (ZOLOFT) 100 MG tablet Take 1 tablet (100 mg total) by mouth daily. 30 tablet 0  . traZODone (DESYREL) 100 MG tablet Take 1 tablet (100 mg total) by mouth at bedtime as needed and may repeat dose one time if needed for sleep. 15 tablet 0   Facility-Administered Medications Ordered in Other Encounters  Medication Dose Route Frequency Provider Last Rate Last Admin  . albuterol (VENTOLIN HFA) 108 (90 Base) MCG/ACT inhaler 1-2 puff  1-2 puff Inhalation Q6H PRN Palumbo, April, MD      . alum & mag hydroxide-simeth (MAALOX/MYLANTA) 200-200-20 MG/5ML suspension 30 mL  30 mL Oral Q6H PRN Palumbo, April, MD      . bacitracin ointment   Topical BID Palumbo, April, MD   1 application at [id number]  . busPIRone (BUSPAR) tablet 5 mg  5 mg Oral TID Palumbo, April, MD   5 mg at 07/08/19 [id number]  . mometasone-formoterol (DULERA) 200-5 MCG/ACT inhaler 2 puff  2 puff Inhalation BID Palumbo, April, MD   2 puff at 07/08/19 0927  . risperiDONE (RISPERDAL) tablet 2 mg  2 mg Oral q AM Palumbo, April, MD   2 mg at 07/08/19 0803  . risperiDONE (RISPERDAL) tablet 3 mg  3 mg Oral STAT Palumbo, April, MD      . risperiDONE (RISPERDAL) tablet 3 mg  3 mg Oral QHS Palumbo, April, MD      . sertraline (ZOLOFT) tablet 100 mg  100 mg Oral Daily Palumbo, April, MD   100 mg at 07/08/19 0926  . traZODone (DESYREL) tablet 100 mg  100 mg Oral QHS PRN,MR X 1 Palumbo, April, MD   100 mg at 07/08/19 0416   PTA Medications: No medications prior to admission.    Musculoskeletal: Strength & Muscle Tone: within normal limits Gait & Station: normal Patient leans: N/A  Psychiatric Specialty Exam: Physical Exam  Nursing note and vitals reviewed. Constitutional: She  is oriented to person, place, and time. She appears well-developed and well-nourished.  Respiratory: Effort normal.  Musculoskeletal:        General: Normal range of motion.  Neurological: She is alert and oriented to person, place, and time.  Psychiatric: Her speech is normal. Her mood appears anxious. She exhibits a depressed mood.    Review of Systems  Psychiatric/Behavioral: Positive for self-injury and suicidal ideas. Negative for hallucinations.  All other systems reviewed and are negative.   There were no vitals taken for this visit.There is no height or weight on file to calculate BMI.  General Appearance: Casual  Eye Contact:  Good  Speech:  Clear and Coherent and Normal Rate  Volume:  Normal  Mood:  Anxious, Depressed and Irritable  Affect:  Labile  Thought Process:  Coherent, Goal Directed and Descriptions of Associations: Intact  Orientation:  Full (Time, Place, and Person)  Thought Content:  WDL  Suicidal Thoughts:  Yes.  without intent/plan  Homicidal Thoughts:  No  Memory:  Immediate;   Good Recent;   Good  Judgement:  Fair  Insight:  Lacking  Psychomotor Activity:  Normal  Concentration:  Concentration: Good and Attention Span: Good  Recall:  Good  Fund of Knowledge:  Fair  Language:  Good  Akathisia:  No  Handed:  Right  AIMS (if indicated):     Assets:  Communication Skills Desire for Improvement Housing Social Support  ADL's:  Intact  Cognition:  WNL  Sleep:         Treatment Plan Summary: Daily contact with patient to assess and evaluate symptoms and progress in treatment, Medication management and Plan Observation over night  Observation Level/Precautions:  15 minute checks Laboratory:  CBC Chemistry Profile HCG UDS UA Psychotherapy:  Individual Medications:  Home medications restarted Consultations:  As needed Discharge Concerns:  Safety Estimated LOS:  Overnight Other:  Trelyn Vanderlinde, NP 6/2/20212:49 PM

## 2019-07-08 NOTE — Discharge Summary (Signed)
  Patient to be transferred to [facility] for overnight psychiatric observation.

## 2019-07-08 NOTE — ED Notes (Signed)
Patient transported to [facility] with safe transport.  Belongings bag sent with her.

## 2019-07-08 NOTE — [facility] Counselor (Signed)
Disposition: Dr. Dwyane Dee recommends in patient treatment. TTS to seek placement.

## 2019-07-09 DIAGNOSIS — F840 Autistic disorder: Secondary | ICD-10-CM | POA: Diagnosis present

## 2019-07-09 DIAGNOSIS — F603 Borderline personality disorder: Secondary | ICD-10-CM | POA: Diagnosis present

## 2019-07-09 DIAGNOSIS — R45851 Suicidal ideations: Secondary | ICD-10-CM

## 2019-07-09 DIAGNOSIS — F4310 Post-traumatic stress disorder, unspecified: Secondary | ICD-10-CM | POA: Diagnosis present

## 2019-07-09 DIAGNOSIS — F250 Schizoaffective disorder, bipolar type: Principal | ICD-10-CM

## 2019-07-09 DIAGNOSIS — J45909 Unspecified asthma, uncomplicated: Secondary | ICD-10-CM | POA: Diagnosis present

## 2019-07-09 DIAGNOSIS — X788XXD Intentional self-harm by other sharp object, subsequent encounter: Secondary | ICD-10-CM | POA: Diagnosis present

## 2019-07-09 DIAGNOSIS — K219 Gastro-esophageal reflux disease without esophagitis: Secondary | ICD-10-CM | POA: Diagnosis present

## 2019-07-09 DIAGNOSIS — S51812D Laceration without foreign body of left forearm, subsequent encounter: Secondary | ICD-10-CM | POA: Diagnosis not present

## 2019-07-09 DIAGNOSIS — Z79899 Other long term (current) drug therapy: Secondary | ICD-10-CM | POA: Diagnosis not present

## 2019-07-09 DIAGNOSIS — Z825 Family history of asthma and other chronic lower respiratory diseases: Secondary | ICD-10-CM | POA: Diagnosis not present

## 2019-07-09 DIAGNOSIS — Z818 Family history of other mental and behavioral disorders: Secondary | ICD-10-CM | POA: Diagnosis not present

## 2019-07-09 DIAGNOSIS — Z148 Genetic carrier of other disease: Secondary | ICD-10-CM | POA: Diagnosis not present

## 2019-07-09 MED ORDER — ACETAMINOPHEN 325 MG PO TABS: 650.0000 mg | ORAL_TABLET | Freq: Four times a day (QID) | ORAL | Status: DC | PRN

## 2019-07-09 MED ORDER — BUSPIRONE HCL 10 MG PO TABS
10.0000 mg | ORAL_TABLET | Freq: Three times a day (TID) | ORAL | Status: DC
  Administered 2019-07-09 – 2019-07-13 (×12): 10 mg via ORAL
  Filled 2019-07-09 (×14): qty 1
  Filled 2019-07-09: qty 2

## 2019-07-09 MED ORDER — ALUM & MAG HYDROXIDE-SIMETH 200-200-20 MG/5ML PO SUSP: 30.0000 mL | ORAL | Status: DC | PRN

## 2019-07-09 MED ORDER — TRAZODONE HCL 150 MG PO TABS
150.0000 mg | ORAL_TABLET | Freq: Every evening | ORAL | Status: DC | PRN
  Administered 2019-07-09: 150 mg via ORAL
  Filled 2019-07-09 (×2): qty 1

## 2019-07-09 MED ORDER — HYDROXYZINE HCL 25 MG PO TABS
25.0000 mg | ORAL_TABLET | Freq: Three times a day (TID) | ORAL | Status: DC | PRN
  Administered 2019-07-09 – 2019-07-12 (×6): 25 mg via ORAL
  Filled 2019-07-09 (×7): qty 1

## 2019-07-09 MED ORDER — MAGNESIUM HYDROXIDE 400 MG/5ML PO SUSP: 30.0000 mL | Freq: Every day | ORAL | Status: DC | PRN

## 2019-07-09 MED ORDER — SERTRALINE HCL 50 MG PO TABS
150.0000 mg | ORAL_TABLET | Freq: Every day | ORAL | Status: DC
  Administered 2019-07-10 – 2019-07-13 (×4): 150 mg via ORAL
  Filled 2019-07-09 (×5): qty 3

## 2019-07-09 MED ORDER — SUCRALFATE 1 G PO TABS
1.0000 g | ORAL_TABLET | Freq: Three times a day (TID) | ORAL | Status: DC
  Administered 2019-07-09 – 2019-07-13 (×15): 1 g via ORAL
  Filled 2019-07-09 (×19): qty 1

## 2019-07-09 MED ORDER — IBUPROFEN 600 MG PO TABS
600.0000 mg | ORAL_TABLET | Freq: Four times a day (QID) | ORAL | Status: DC | PRN
  Administered 2019-07-10 – 2019-07-12 (×4): 600 mg via ORAL
  Filled 2019-07-09 (×4): qty 1
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
## 2019-07-09 NOTE — BH Assessment (Signed)
[facility] Assessment Progress Note  Per Myles Lipps, MD, this voluntary pt requires psychiatric hospitalization at this time.  Kathalene Frames, RN has assigned pt to [facility] Rm [id number].  Pt's nurse, Nicoletta Dress, has been notified.  [name], [place] Coordinator [phone]

## 2019-07-09 NOTE — Progress Notes (Signed)
CSW contacted staff at [facility]. Per staff member, pt has not received services since 2019, however, they reported that [facility] does have does have a care coordinator who has been attempting to reach out to pt but has been unable to make contact.   Audree Camel, LCSW, [place] Disposition [facility]/TTS [phone]

## 2019-07-09 NOTE — Progress Notes (Signed)
  COVID-19 Daily Checkoff  Have you had a fever (temp > 37.80C/100F)  in the past 24 hours?  No  If you have had runny nose, nasal congestion, sneezing in the past 24 hours, has it worsened? No  COVID-19 EXPOSURE  Have you traveled outside the state in the past 14 days? No  Have you been in contact with someone with a confirmed diagnosis of COVID-19 or PUI in the past 14 days without wearing appropriate PPE? No  Have you been living in the same home as a person with confirmed diagnosis of COVID-19 or a PUI (household contact)? No  Have you been diagnosed with COVID-19? No               D:  Patient presents cooperative this am. Received am medication without difficulty. Writer questioned pt as to why she was displaying self harm behaviors in the middle of the night. Patent responded, "I just want something to take the suicidal thoughts away. The thoughts are so intense. But really, I don't want to die. I want to live for my daughter. I don't know. Maybe I need to up my Zoloft and Buspar or get it changed all together. I dreamed that I killed myself last night and saw my dead husband. It felt good. I think about killing myself all day and dream about it at night. I can't get away from the thoughts. Plus, I need to get back with the [facility]. Things were better for me when I was with them. They made me feel like they really care. Right now, I am still feeling suicidal. I try to focus on my daughter  But it gets hard sometimes. If I go home, I can't guarantee that I won't take a whole bottle of pills. I still see Jesus and hear voices telling me to die. Someone has to help me. I can't keep living like this". Pt denies any physical complaints when asked. At present patient rates her mood #3 (0-10).    A: Support and encouragement provided. Encouraged pt to work on developing 1 positive distraction technique for the suicidal thinking. Writer gave pt some suggestions (reflect on her daughter and her  playing in the waves at the beach eating ice cream on their vacation, coloring pages, journal thoughts, jumping jacks). Routine safety checks conducted every 15 minutes per unit protocol. Encouraged pt to notify staff with urges or impulses to act on self harm behaviors.  R: Patient remains safe at this time, patient verbally contracts for safety at this time. Will continue to monitor.
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
## 2019-07-09 NOTE — Progress Notes (Signed)
Patient stated she does have thoughts of hurting herself at this time, contracts for safety.  May just scratch her arm or hit her head, one or the other.  After discharge will hurt herself by pills OD.  If she does not get any help, she will take pills, that's plain and simple.  At night she still has hallucinations of seeing Jesus, that's it.  She is still hearing voices that are mild, just like "kill, kill, kill".  To kill herself.  Patient stated she wants her zoloft increased.  That's about it.   Patient stated she never has thoughts of killing others.  She wishes she had a beer in here, that's it.

## 2019-07-09 NOTE — Progress Notes (Signed)
Pt reported standing next wall in her room banging her head on the wall stating that her demands were not being met, pt complained of being suicidal, knees pain and insomnia. Pt given Advil 600 mg, Trazodone 50 mg, and Vistaril 25 mg. Pt calmed down and went to sleep. Will continue to observe.
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
## 2019-07-09 NOTE — Progress Notes (Signed)
Patient accepted to inpatient unit on Adult [facility] hall. Pt accepting of admission to unit. Pt escorted to unit via staff without difficulty. RN gave report to [place], Therapist, sports on unit. Safety maintained.

## 2019-07-10 DIAGNOSIS — F840 Autistic disorder: Secondary | ICD-10-CM

## 2019-07-10 MED ORDER — TRAZODONE HCL 100 MG PO TABS
200.0000 mg | ORAL_TABLET | Freq: Every evening | ORAL | Status: DC | PRN
  Administered 2019-07-10 – 2019-07-12 (×3): 200 mg via ORAL
  Filled 2019-07-10 (×3): qty 2

## 2019-07-10 NOTE — Progress Notes (Signed)
Patient  Currently sleeping soundly. No sign of distress while asleep.

## 2019-07-10 NOTE — Tx Team (Signed)
Interdisciplinary Treatment and Diagnostic Plan Update  07/10/2019 Time of Session: 9:00am [name] MRN: [id number]  Principal Diagnosis: <principal problem not specified>  Secondary Diagnoses: Active Problems:   Borderline personality disorder ([place])   Autism spectrum disorder   Suicidal ideations   Schizoaffective disorder, bipolar type (HCC)   Borderline personality disorder (HCC)   PTSD (post-traumatic stress disorder)   Current Medications:  Current Facility-Administered Medications  Medication Dose Route Frequency Provider Last Rate Last Admin  . acetaminophen (TYLENOL) tablet 650 mg  650 mg Oral Q6H PRN Sharma Covert, MD      . albuterol (VENTOLIN HFA) 108 (90 Base) MCG/ACT inhaler 1-2 puff  1-2 puff Inhalation Q6H PRN Rankin, Shuvon B, NP   2 puff at 07/09/19 0005  . alum & mag hydroxide-simeth (MAALOX/MYLANTA) 200-200-20 MG/5ML suspension 30 mL  30 mL Oral Q4H PRN Sharma Covert, MD      . busPIRone (BUSPAR) tablet 10 mg  10 mg Oral TID Sharma Covert, MD   10 mg at 07/10/19 0738  . hydrOXYzine (ATARAX/VISTARIL) tablet 25 mg  25 mg Oral TID PRN Sharma Covert, MD   25 mg at 07/10/19 0105  . ibuprofen (ADVIL) tablet 600 mg  600 mg Oral Q6H PRN Sharma Covert, MD   600 mg at 07/10/19 0104  . magnesium hydroxide (MILK OF MAGNESIA) suspension 30 mL  30 mL Oral Daily PRN Sharma Covert, MD      . mometasone-formoterol [facility]) 200-5 MCG/ACT inhaler 2 puff  2 puff Inhalation BID Rankin, Shuvon B, NP   2 puff at 07/09/19 0748  . neomycin-bacitracin-polymyxin (NEOSPORIN) ointment   Topical PRN Hampton Abbot, MD   Given at 07/09/19 2116  . pantoprazole (PROTONIX) EC tablet 40 mg  40 mg Oral Daily Rankin, Shuvon B, NP   40 mg at 07/10/19 0738  . risperiDONE (RISPERDAL) tablet 2 mg  2 mg Oral q AM Rankin, Shuvon B, NP   2 mg at 07/10/19 0641  . risperiDONE (RISPERDAL) tablet 3 mg  3 mg Oral QHS Rankin, Shuvon B, NP   3 mg at 07/09/19 2119  .  sertraline (ZOLOFT) tablet 150 mg  150 mg Oral Daily Sharma Covert, MD   150 mg at 07/10/19 0738  . sucralfate (CARAFATE) tablet 1 g  1 g Oral TID WC & HS Sharma Covert, MD   1 g at 07/10/19 0738  . traZODone (DESYREL) tablet 150 mg  150 mg Oral QHS PRN Sharma Covert, MD   150 mg at 07/09/19 2119   PTA Medications: Medications Prior to Admission  Medication Sig Dispense Refill Last Dose  . albuterol (PROVENTIL HFA;VENTOLIN HFA) 108 (90 Base) MCG/ACT inhaler Inhale 1-2 puffs into the lungs every 6 (six) hours as needed for wheezing or shortness of breath.     . busPIRone (BUSPAR) 5 MG tablet Take 1 tablet (5 mg total) by mouth 3 (three) times daily. 90 tablet 0   . famotidine (PEPCID) 10 MG tablet Take 10 mg by mouth daily.     . fluticasone-salmeterol (ADVAIR HFA) 115-21 MCG/ACT inhaler Inhale 2 puffs into the lungs 2 (two) times daily. 1 Inhaler 5   . hydrOXYzine (ATARAX/VISTARIL) 25 MG tablet Take 1 tablet (25 mg total) by mouth every 6 (six) hours as needed for anxiety. 30 tablet 0   . pantoprazole (PROTONIX) 40 MG tablet Take 1 tablet (40 mg total) by mouth daily. 30 tablet 0   . risperiDONE (RISPERDAL) 2  MG tablet Take 1 tablet (2 mg total) by mouth in the morning. 30 tablet 0   . risperiDONE (RISPERDAL) 3 MG tablet Take 1 tablet (3 mg total) by mouth at bedtime. 30 tablet 0   . sertraline (ZOLOFT) 100 MG tablet Take 1 tablet (100 mg total) by mouth daily. 30 tablet 0   . traZODone (DESYREL) 100 MG tablet Take 1 tablet (100 mg total) by mouth at bedtime as needed and may repeat dose one time if needed for sleep. 15 tablet 0     Patient Stressors:    Patient Strengths:    Treatment Modalities: Medication Management, Group therapy, Case management,  1 to 1 session with clinician, Psychoeducation, Recreational therapy.   Physician Treatment Plan for Primary Diagnosis: <principal problem not specified> Long Term Goal(s): Improvement in symptoms so as ready for  discharge Improvement in symptoms so as ready for discharge   Short Term Goals: Ability to identify changes in lifestyle to reduce recurrence of condition will improve Ability to verbalize feelings will improve Ability to disclose and discuss suicidal ideas Ability to demonstrate self-control will improve Ability to identify and develop effective coping behaviors will improve Ability to maintain clinical measurements within normal limits will improve Compliance with prescribed medications will improve Ability to identify changes in lifestyle to reduce recurrence of condition will improve Ability to verbalize feelings will improve Ability to disclose and discuss suicidal ideas Ability to demonstrate self-control will improve Ability to identify and develop effective coping behaviors will improve Ability to maintain clinical measurements within normal limits will improve Compliance with prescribed medications will improve  Medication Management: Evaluate patient's response, side effects, and tolerance of medication regimen.  Therapeutic Interventions: 1 to 1 sessions, Unit Group sessions and Medication administration.  Evaluation of Outcomes: Not Met  Physician Treatment Plan for Secondary Diagnosis: Active Problems:   Borderline personality disorder ([place])   Autism spectrum disorder   Suicidal ideations   Schizoaffective disorder, bipolar type ([place])   Borderline personality disorder (HCC)   PTSD (post-traumatic stress disorder)  Long Term Goal(s): Improvement in symptoms so as ready for discharge Improvement in symptoms so as ready for discharge   Short Term Goals: Ability to identify changes in lifestyle to reduce recurrence of condition will improve Ability to verbalize feelings will improve Ability to disclose and discuss suicidal ideas Ability to demonstrate self-control will improve Ability to identify and develop effective coping behaviors will improve Ability to maintain  clinical measurements within normal limits will improve Compliance with prescribed medications will improve Ability to identify changes in lifestyle to reduce recurrence of condition will improve Ability to verbalize feelings will improve Ability to disclose and discuss suicidal ideas Ability to demonstrate self-control will improve Ability to identify and develop effective coping behaviors will improve Ability to maintain clinical measurements within normal limits will improve Compliance with prescribed medications will improve     Medication Management: Evaluate patient's response, side effects, and tolerance of medication regimen.  Therapeutic Interventions: 1 to 1 sessions, Unit Group sessions and Medication administration.  Evaluation of Outcomes: Not Met   RN Treatment Plan for Primary Diagnosis: <principal problem not specified> Long Term Goal(s): Knowledge of disease and therapeutic regimen to maintain health will improve  Short Term Goals: Ability to demonstrate self-control, Ability to participate in decision making will improve, Ability to identify and develop effective coping behaviors will improve and Compliance with prescribed medications will improve  Medication Management: RN will administer medications as ordered by provider, will assess and  evaluate patient's response and provide education to patient for prescribed medication. RN will report any adverse and/or side effects to prescribing provider.  Therapeutic Interventions: 1 on 1 counseling sessions, Psychoeducation, Medication administration, Evaluate responses to treatment, Monitor vital signs and CBGs as ordered, Perform/monitor CIWA, COWS, AIMS and Fall Risk screenings as ordered, Perform wound care treatments as ordered.  Evaluation of Outcomes: Not Met   LCSW Treatment Plan for Primary Diagnosis: <principal problem not specified> Long Term Goal(s): Safe transition to appropriate next level of care at discharge,  Engage patient in therapeutic group addressing interpersonal concerns.  Short Term Goals: Engage patient in aftercare planning with referrals and resources, Increase social support, Increase emotional regulation, Identify triggers associated with mental health/substance abuse issues and Increase skills for wellness and recovery  Therapeutic Interventions: Assess for all discharge needs, 1 to 1 time with Social worker, Explore available resources and support systems, Assess for adequacy in community support network, Educate family and significant other(s) on suicide prevention, Complete Psychosocial Assessment, Interpersonal group therapy.  Evaluation of Outcomes: Not Met  Progress in Treatment: Attending groups: No. New to unit. Participating in groups: No. Taking medication as prescribed: Yes. Toleration medication: Yes. Family/Significant other contact made: No, will contact:  supports if consents are granted. Patient understands diagnosis: Yes. Discussing patient identified problems/goals with staff: Yes. Medical problems stabilized or resolved: Yes. Denies suicidal/homicidal ideation: No. Issues/concerns per patient self-inventory: Yes.  New problem(s) identified: Yes, Describe:  Limited social supports, child custody issues  New Short Term/Long Term Goal(s): medication management for mood stabilization; elimination of SI thoughts; development of comprehensive mental wellness/sobriety plan.  Patient Goals: "Eliminate or decrease my suicidal thoughts."  Discharge Plan or Barriers: Patient is established with [facility] for CST and [facility] for outpatient medication management and therapy.  Reason for Continuation of Hospitalization: Anxiety Depression Medication stabilization Suicidal ideation  Estimated Length of Stay: 3-5 days  Attendees: Patient: [name] 07/10/2019 10:28 AM  Physician:  07/10/2019 10:28 AM  Nursing:  07/10/2019 10:28 AM  RN Care Manager: 07/10/2019 10:28 AM  Social  Worker: Radonna Ricker, [place] 07/10/2019 10:28 AM  Recreational Therapist:  07/10/2019 10:28 AM  Other: Marvia Pickles, NP 07/10/2019 10:28 AM  Other:  07/10/2019 10:28 AM  Other: 07/10/2019 10:28 AM    Scribe for Treatment Team: Joellen Jersey, [place] 07/10/2019 10:28 AM
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
## 2019-07-10 NOTE — Plan of Care (Signed)
Has remained restless and anxious. Frequently calling the nurse. Intrusive and somatic. Was observed talking to self and reported that she was hearing voices but would not discuss more about it. Complained of headache and received Ibuprofen. Skin assessment performed. No sign of infection at the affected area. Has not been able to sleep so far. Staff continue to monitor for safety.

## 2019-07-10 NOTE — BHH Group Notes (Signed)
Type of Therapy/Topic: Group Therapy: Emotion Regulation  Participation Level: Active  Description of Group: The purpose of this group is to assist patients in learning to regulate negative emotions and experience positive emotions. Patients will be guided to discuss ways in which they have been vulnerable to their negative emotions. These vulnerabilities will be juxtaposed with experiences of positive emotions or situations, and patients will be challenged to use positive emotions to combat negative ones. Special emphasis will be placed on coping with negative emotions in conflict situations, and patients will process healthy conflict resolution skills.  Therapeutic Goals: 1. Patient will identify two positive emotions or experiences to reflect on in order to balance out negative emotions 2. Patient will label two or more emotions that they find the most difficult to experience 3. Patient will demonstrate positive conflict resolution skills through discussion and/or role plays  Summary of Patient Progress: Patient was provided printed worksheet. The topic and worksheet was reviewed with patient. Patient was provided the opportunity to ask questions and was provided individual feedback.   Therapeutic Modalities: Cognitive Behavioral Therapy Feelings Identification Dialectical Behavioral Therapy
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
## 2019-07-10 NOTE — Plan of Care (Signed)
Has been active in the milieu and remained calm and cooperative. Attended group. Presented to the medication window and reported that she would like to be discharged Monday because she has to meet with her therapist and also has to work on her child' custody. Patient reported that she is feeling better, expressing readiness for discharge. Maintained expected behavior and received medications as prescribed. Currently in room, preparing for bed. No sign of distress. Support and encouragements provided. Safety precautions maintained.

## 2019-07-10 NOTE — Progress Notes (Signed)
Pt has been observed throughout the night lying in bed talking to self. Pt has lights on and appears to be resisting sleep. @0000  check, pt was observed falling asleep but then immediately opening eyes when writer came in to do 15 min check. Writer asked pt if they would like lights cut off, pt replied "no".

## 2019-07-10 NOTE — Progress Notes (Signed)
Recreation Therapy Notes  Date: 6.4.21 Time: 0930 Location: 300 Hall Group Room  Group Topic: Stress Management  Goal Area(s) Addresses:  Patient will identify positive stress management techniques. Patient will identify benefits of using stress management post d/c.  Intervention: Stress Management  Activity:  Guided Imagery.  LRT read a script that lead patients to enjoy the peaceful waves at the beach at sunrise.  Patients were to listen and follow along as script was read to engage in activity.  Education:  Stress Management, Discharge Planning.   Education Outcome: Acknowledges Education  Clinical Observations/Feedback: Pt did not attend activity.    Victorino Sparrow, LRT/CTRS         Victorino Sparrow A 07/10/2019 11:28 AM

## 2019-07-10 NOTE — Progress Notes (Signed)
Patient had been experiencing difficulty falling asleep, medications not helping. Has been pacing and currently in bed awake. Safety precautions maintained.

## 2019-07-11 NOTE — Progress Notes (Signed)
Patient  Called the nurses station to report that she was having nausea. MHT went to patient room and found patient in bed. Patient reported that she got dizzy and fell on the floor beside the bed. This writer went to patient room and found patient sitting in bed. Patient reported that she felt dizzy and fell. Assessment performed: no injury noted. Vital signs : 97.7; 73; 18; 119/49; 99%,RA. Provider notified at 2357. Writer returned to patient's room and asked patient to describe the fall event. Patient stated that she flipped off her shoe and was about to fall but  turned and held the bed for support and did not actually fall. Patient was avoiding eye contact when describing the fall. Patient had no sign of distress but was avoiding eye contact. Patient has recent  history of displaying manipulative and attention-seeking behaviors. @ 0033: patient presented to the medication window, acting silly and saying that she wants to be discharged Monday, that if she does not get discharged she will leave by force. Patient also said  "I think all these medications are not working, I still feel anxious and angry. Can I have more medication ?". Patient received Vistaril 25 mg PO and returned to her room. Did not want to discuss about the fall anymore. Currently in bed awake. Safety precautions reinforced.
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
## 2019-07-11 NOTE — Progress Notes (Signed)
°   07/11/19 2345  COVID-19 Daily Checkoff  Have you had a fever (temp > 37.80C/100F)  in the past 24 hours?  No  If you have had runny nose, nasal congestion, sneezing in the past 24 hours, has it worsened? No  COVID-19 EXPOSURE  Have you traveled outside the state in the past 14 days? No  Have you been in contact with someone with a confirmed diagnosis of COVID-19 or PUI in the past 14 days without wearing appropriate PPE? No  Have you been living in the same home as a person with confirmed diagnosis of COVID-19 or a PUI (household contact)? No  Have you been diagnosed with COVID-19? No

## 2019-07-11 NOTE — Progress Notes (Signed)
   07/11/19 2348  Psych Admission Type (Psych Patients Only)  Admission Status Voluntary  Psychosocial Assessment  Patient Complaints Anxiety  Eye Contact Fair  Facial Expression Anxious  Affect Appropriate to circumstance  Speech Logical/coherent  Interaction Childlike;Attention-seeking;Needy  Motor Activity Other (Comment) (WDL)  Appearance/Hygiene Unremarkable  Behavior Characteristics Appropriate to situation  Mood Anxious  Thought Process  Coherency WDL  Content Preoccupation  Delusions None reported or observed  Perception WDL  Hallucination None reported or observed  Judgment Poor  Confusion None  Danger to Self  Current suicidal ideation? Denies  Self-Injurious Behavior No self-injurious ideation or behavior indicators observed or expressed   Agreement Not to Harm Self Yes  Description of Agreement verbal  Danger to Others  Danger to Others None reported or observed

## 2019-07-11 NOTE — Progress Notes (Addendum)
   07/11/19 1400  Psych Admission Type (Psych Patients Only)  Admission Status Voluntary  Psychosocial Assessment  Patient Complaints None  Eye Contact Fair  Facial Expression Other (Comment) (indiferent)  Affect Appropriate to circumstance;Euphoric  Speech Logical/coherent  Interaction Attention-seeking;Childlike;Demanding  Motor Activity Slow  Appearance/Hygiene Unremarkable  Behavior Characteristics Cooperative  Mood Anxious  Aggressive Behavior  Targets Self  Type of Behavior Other (Comment) (hitting her arms against the wall)  Thought Process  Coherency WDL  Content Preoccupation  Delusions WDL  Perception WDL  Hallucination None reported or observed  Judgment Impaired  Confusion None  Danger to Self  Current suicidal ideation? Denies  Self-Injurious Behavior No self-injurious ideation or behavior indicators observed or expressed   Agreement Not to Harm Self Yes  Description of Agreement verbal  Danger to Others  Danger to Others None reported or observed

## 2019-07-12 MED ORDER — ONDANSETRON HCL 4 MG PO TABS
4.0000 mg | ORAL_TABLET | Freq: Three times a day (TID) | ORAL | Status: DC | PRN
  Administered 2019-07-12: 4 mg via ORAL
  Filled 2019-07-12: qty 1

## 2019-07-12 NOTE — Plan of Care (Signed)
Nurse discussed anxiety, depression and coping skills. 

## 2019-07-12 NOTE — Progress Notes (Signed)
D:  Patient's self inventory sheet, patient sleeps good, sleep medicine helpful.  Patient has good appetite,

## 2019-07-12 NOTE — [facility] Group Notes (Signed)
[facility] LCSW Group Therapy Note  Date/Time:  07/12/2019 1015  Type of Therapy and Topic:  Group Therapy:  Healthy and Unhealthy Supports  Participation Level:  Active   Description of Group:  Patients in this group were introduced to the idea of adding a variety of healthy supports to address the various needs in their lives.Patients discussed what additional healthy supports could be helpful in their recovery and wellness after discharge in order to prevent future hospitalizations.   An emphasis was placed on using counselor, doctor, therapy groups, 12-step groups, and problem-specific support groups to expand supports.  They also worked as a group on developing a specific plan for several patients to deal with unhealthy supports through [place], psychoeducation with loved ones, and even termination of relationships.   Therapeutic Goals:   1)  discuss importance of adding supports to stay well once out of the hospital  2)  compare healthy versus unhealthy supports and identify some examples of each  3)  generate ideas and descriptions of healthy supports that can be added  4)  offer mutual support about how to address unhealthy supports  5)  encourage active participation in and adherence to discharge plan    Summary of Patient Progress:  The patient actively engaged in introductory check-in, sharing of feeling "Just down, I want to leave, I'm a little anxious now". Pt too provided individual definition of support, stating "To be there for someone". Pt actively engaged in process discussion surrounding healthy and unhealthy supports, stated that current unhealthy supports include her mother, and current healthy supports include school, her daughter, and medicine.  The patient expressed a willingness to add an ACTT team as support(s) to help in her recovery journey. Pt actively engaged with alternate group members and proved receptive to group members input and feedback from  [place].   Therapeutic Modalities:   Motivational Interviewing Brief Solution-Focused Therapy  Katherina Mires 07/12/2019  12:58 PM

## 2019-07-12 NOTE — Progress Notes (Signed)
   07/12/19 1940  COVID-19 Daily Checkoff  Have you had a fever (temp > 37.80C/100F)  in the past 24 hours?  No  COVID-19 EXPOSURE  Have you traveled outside the state in the past 14 days? No  Have you been in contact with someone with a confirmed diagnosis of COVID-19 or PUI in the past 14 days without wearing appropriate PPE? No  Have you been living in the same home as a person with confirmed diagnosis of COVID-19 or a PUI (household contact)? No  Have you been diagnosed with COVID-19? No

## 2019-07-12 NOTE — Progress Notes (Signed)
[facility] Group Notes:  (Nursing/MHT/Case Management/Adjunct)  Date:  07/12/2019  Time:  2000 Type of Therapy:  wrap up group  Participation Level:  Active  Participation Quality:  Appropriate, Attentive, Sharing and Supportive  Affect:  Depressed and Irritable  Cognitive:  Lacking  Insight:  Lacking  Engagement in Group:  Improving  Modes of Intervention:  Clarification, Education and Support  Summary of Progress/Problems: Positive thinking and positive change were discussed. Pt reports looking forward to discharge tomorrow and sleeping better through the night. Pt has hopes in finishing school and finding a job she likes.   Winfield Rast S 07/12/2019, 9:04 PM
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
## 2019-07-12 NOTE — Progress Notes (Signed)
D:  Patient's self inventory sheet, patient sleeps good, sleep medication helpful.  Good appetite, normal energy level, poor concentration.  Rated depression 2, denied hopeless, anxiety #10.  Denied withdrawals.  Denied physical problems.  Physical pain, back, worst pain #5 in past 24 hours.  Goal is discharge.  Plans to talk to MD.  Does have discharge plans. A:  Medications administered per MD orders.  Emotional support and encouragement given patient. R:  Denied SI and HI, contracts for safety.  Denied A/V hallucinations.

## 2019-07-12 NOTE — [facility] Group Notes (Signed)
[facility] Group Notes:  (Nursing/MHT/Case Management/Adjunct)  Date:  07/12/2019  Time:  1:14 AM  Type of Therapy:  Group Therapy  Participation Level:  Active  Participation Quality:  Appropriate  Affect:  Appropriate  Cognitive:  Appropriate  Insight:  Appropriate  Engagement in Group:  Engaged  Modes of Intervention:  Discussion  Summary of Progress/Problems: pt was attentive and appropriate during tonight's wrap up group. Pt shared that she is feeling better and didn't have any suicidal  thoughts. Pt stated that she would like to work on discharge.   Theodoro Grist D 07/12/2019, 1:14 AM

## 2019-07-13 MED ORDER — TRAZODONE HCL 100 MG PO TABS: 200.0000 mg | ORAL_TABLET | Freq: Every evening | ORAL | 0 refills | Status: DC | PRN

## 2019-07-13 MED ORDER — SERTRALINE HCL 50 MG PO TABS: 150.0000 mg | ORAL_TABLET | Freq: Every day | ORAL | 0 refills | Status: DC

## 2019-07-13 MED ORDER — SUCRALFATE 1 G PO TABS: 1.0000 g | ORAL_TABLET | Freq: Three times a day (TID) | ORAL | 0 refills | Status: DC

## 2019-07-13 MED ORDER — BACITRACIN-NEOMYCIN-POLYMYXIN OINTMENT TUBE: 1.0000 "application " | TOPICAL_OINTMENT | CUTANEOUS | Status: DC | PRN

## 2019-07-13 MED ORDER — BUSPIRONE HCL 10 MG PO TABS: 10.0000 mg | ORAL_TABLET | Freq: Three times a day (TID) | ORAL | 0 refills | Status: DC

## 2019-07-13 NOTE — Progress Notes (Signed)
Recreation Therapy Notes  Date:  6.7.21 Time: 0930 Location: [address] Group Room  Group Topic: Stress Management  Goal Area(s) Addresses:  Patient will identify positive stress management techniques. Patient will identify benefits of using stress management post d/c.  Intervention: Stress Management  Activity: Meditation.  LRT played a meditation that focused on embracing change.  Patients were to listen and follow along as meditation was read to engage in activity.    Education:  Stress Management, Discharge Planning.   Education Outcome: Acknowledges Education  Clinical Observations/Feedback: Pt did not attend activity.    Victorino Sparrow, LRT/CTRS         Ria Comment, Lindy Pennisi A 07/13/2019 11:09 AM

## 2019-07-13 NOTE — Progress Notes (Signed)
Pt said that her day had been "okay." She said that the staff was getting on her "nerves" because "they're talking about me." Pt didn't elaborate as to what staff was saying about her. Pt is focused on getting discharged and she says that she feels like she's ready because she has met her goal to "stop her suicidal thoughts." Pt was asked about her plans after returning home and she said that she'll be "chilling and going back to finish school." Pt did report feeling nauseated because she didn't eat much of her meals today because she doesn't like the food here. Pt was offered gold fish and juice which she consumed. Pt now denies any further complaints of nausea. Pt denies SI/HI and AVH. Active listening, reassurance, and support provided. Q 15 minute safety checks continue.    07/12/19 1940  Psych Admission Type (Psych Patients Only)  Admission Status Voluntary  Psychosocial Assessment  Patient Complaints Anxiety;Tension;Suspiciousness  Eye Contact Avoids  Facial Expression Anxious  Affect Anxious;Euphoric  Speech Logical/coherent;Rapid  Interaction Assertive;Attention-seeking;Needy;Childlike  Motor Activity Other (Comment) (WNL)  Appearance/Hygiene Unremarkable  Behavior Characteristics Anxious;Cooperative  Mood Anxious  Thought Process  Coherency WDL  Content Preoccupation  Delusions None reported or observed  Perception WDL  Hallucination None reported or observed  Judgment Poor  Confusion None  Danger to Self  Current suicidal ideation? Denies  Danger to Others  Danger to Others None reported or observed
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
## 2019-07-13 NOTE — Progress Notes (Signed)
D:  Patient's self inventory sheet, patient sleeps good, sleep medication helpful.  Fair appetite, normal energy level, good concentration.  Denied depression and hopeless, anxiety 3.  Denied withdrawals, then checked nausea.  Denied SI.  Denied physical problems.  Denied physical pain.  Pain medication helpful.  Goal is discharge.  Does have discharge plans. A:  Medications administered per MD orders.  Emotional support and encouragement given patient.

## 2019-07-13 NOTE — Progress Notes (Signed)
Discharge Note:  Patient discharged home with transport that patient called.  Patient denied SI and HI.  Denied A/V hallucinations.  Denied pain.  Suicide prevention information given and discussed with patient who stated she understood and had no questions.  Patient stated she received all her belongings, clothing, toiletries, misc items, etc.  Patient stated she appreciated all assistance received from [facility] staff.  All required discharge instructions given at discharge.

## 2019-07-13 NOTE — BHH Suicide Risk Assessment (Signed)
[facility] Discharge Suicide Risk Assessment   Principal Problem: MDD (major depressive disorder), recurrent severe, without psychosis ([place]) Discharge Diagnoses: Principal Problem:   MDD (major depressive disorder), recurrent severe, without psychosis ([place]) Active Problems:   Borderline personality disorder ([place])   Autism spectrum disorder   Suicidal ideations   Schizoaffective disorder, bipolar type ([place])   Borderline personality disorder ([place])   PTSD (post-traumatic stress disorder)   Total Time spent with patient: 15 minutes  Musculoskeletal: Strength & Muscle Tone: within normal limits Gait & Station: normal Patient leans: N/A  Psychiatric Specialty Exam: Review of Systems  All other systems reviewed and are negative.   Blood pressure 100/70, pulse (!) 101, temperature 98.4 F (36.9 C), temperature source Oral, resp. rate 16, height 5\' 5"  (1.651 m), weight 88.9 kg, SpO2 100 %.Body mass index is 32.61 kg/m.  General Appearance: Casual  Eye Contact::  Fair  Speech:  Normal Rate409  Volume:  Normal  Mood:  Euthymic  Affect:  Congruent  Thought Process:  Coherent and Descriptions of Associations: Intact  Orientation:  Full (Time, Place, and Person)  Thought Content:  Logical  Suicidal Thoughts:  No  Homicidal Thoughts:  No  Memory:  Immediate;   Fair Recent;   Fair Remote;   Fair  Judgement:  Intact  Insight:  Fair  Psychomotor Activity:  Increased  Concentration:  Fair  Recall:  [employer] of Knowledge:Fair  Language: Fair  Akathisia:  Negative  Handed:  Right  AIMS (if indicated):     Assets:  Desire for Improvement Resilience  Sleep:  Number of Hours: 5.75  Cognition: WNL  ADL's:  Intact   Mental Status Per Nursing Assessment::   On Admission:  Self-harm behaviors, Suicidal ideation indicated by patient  Demographic Factors:  Caucasian, Living alone and Unemployed  Loss Factors: NA  Historical Factors: Impulsivity  Risk Reduction Factors:    NA  Continued Clinical Symptoms:  Depression:   Impulsivity Personality Disorders:   Cluster B  Cognitive Features That Contribute To Risk:  None    Suicide Risk:  Minimal: No identifiable suicidal ideation.  Patients presenting with no risk factors but with morbid ruminations; may be classified as minimal risk based on the severity of the depressive symptoms    Plan Of Care/Follow-up recommendations:  Activity:  ad lib  Sharma Covert, MD 07/13/2019, 8:11 AM

## 2019-07-13 NOTE — Progress Notes (Signed)
  [facility] Adult Case Management Discharge Plan :  Will you be returning to the same living situation after discharge:  Yes,  patient is returning home, alone At discharge, do you have transportation home?: Yes,  patient reports she is calling herself an [facility] Popper Do you have the ability to pay for your medications: Yes,  [employer]  Release of information consent forms completed and in the chart;  Patient's signature needed at discharge.  Patient to Follow up at: Follow-up Information    [facility] [place]. Go on 07/14/2019.   Why: You have an appointment on  Tuesday, 07/14/19 at 10:30 am with Theodis Sato  to be set up for CST services. This appointment will be held in person. Please be sure to provide any discharge paperwork  Contact information: [address] [place] [address] [phone]        [facility]. Go on 07/16/2019.   Why: Your appointment for medication management is  Thursday, 07/16/19 at 12:00pm with Josph Macho. Please be sure to provide any discharge paperwork, inlcuding your list of medications.  Contact information: [address] [place], [place] [address]  Telephone: [phone]          Next level of care provider has access to [place] and Suicide Prevention discussed: Yes,  with the patient     Has patient been referred to the Quitline?: Patient refused referral  Patient has been referred for addiction treatment: N/A  Marylee Floras, LCSWA 07/13/2019, 11:07 AM

## 2019-07-14 NOTE — Progress Notes (Signed)
Spiritual care group on grief and loss facilitated by chaplain Jerene Pitch MDiv, B[facility]  Group Goal:  Support / Education around grief and loss Members engage in facilitated group support and psycho-social education.  Group Description:  Following introductions and group rules, group members engaged in facilitated group dialog and support around topic of loss, with particular support around experiences of loss in their lives. Group Identified types of loss (relationships / self / things) and identified patterns, circumstances, and changes that precipitate losses. Reflected on thoughts / feelings around loss, normalized grief responses, and recognized variety in grief experience.   Group noted Worden's four tasks of grief in discussion.  Group drew on Adlerian / Rogerian, narrative, MI, Patient Progress:  Raedyn was present briefly during group.  Noted that her fiance had died via suicide as well as the death of her grandmother.  Evanee connected with another group member, who had lost her grandmother as well.  Both spoke of avoidance of feelings - and processed how this had built up for them over time.   Elena left room and did not return

## 2019-07-15 DIAGNOSIS — J4540 Moderate persistent asthma, uncomplicated: Secondary | ICD-10-CM | POA: Diagnosis not present

## 2019-07-15 DIAGNOSIS — N644 Mastodynia: Secondary | ICD-10-CM | POA: Diagnosis not present

## 2019-07-15 DIAGNOSIS — E669 Obesity, unspecified: Secondary | ICD-10-CM | POA: Diagnosis not present

## 2019-07-15 DIAGNOSIS — F3130 Bipolar disorder, current episode depressed, mild or moderate severity, unspecified: Secondary | ICD-10-CM | POA: Diagnosis not present

## 2019-07-15 DIAGNOSIS — Z6835 Body mass index (BMI) 35.0-35.9, adult: Secondary | ICD-10-CM | POA: Diagnosis not present

## 2019-07-15 DIAGNOSIS — Z0000 Encounter for general adult medical examination without abnormal findings: Secondary | ICD-10-CM | POA: Diagnosis not present

## 2019-07-15 DIAGNOSIS — K219 Gastro-esophageal reflux disease without esophagitis: Secondary | ICD-10-CM | POA: Diagnosis not present

## 2019-07-16 ENCOUNTER — Other Ambulatory Visit: Payer: Self-pay

## 2019-07-16 ENCOUNTER — Emergency Department (HOSPITAL_COMMUNITY): Payer: PPO

## 2019-07-16 ENCOUNTER — Encounter (HOSPITAL_COMMUNITY): Payer: Self-pay | Admitting: Emergency Medicine

## 2019-07-16 ENCOUNTER — Emergency Department (HOSPITAL_COMMUNITY)
Admission: EM | Admit: 2019-07-16 | Discharge: 2019-07-16 | Disposition: A | Discharge status: Home / Self Care | Payer: PPO | Attending: Emergency Medicine | Admitting: Emergency Medicine

## 2019-07-16 DIAGNOSIS — Z87891 Personal history of nicotine dependence: Secondary | ICD-10-CM | POA: Diagnosis not present

## 2019-07-16 DIAGNOSIS — J45909 Unspecified asthma, uncomplicated: Secondary | ICD-10-CM | POA: Diagnosis not present

## 2019-07-16 DIAGNOSIS — R0789 Other chest pain: Secondary | ICD-10-CM | POA: Diagnosis not present

## 2019-07-16 DIAGNOSIS — R079 Chest pain, unspecified: Secondary | ICD-10-CM | POA: Diagnosis not present

## 2019-07-16 DIAGNOSIS — N644 Mastodynia: Secondary | ICD-10-CM

## 2019-07-16 LAB — BASIC METABOLIC PANEL
Anion gap: 10 (ref 5–15)
BUN: 8 mg/dL (ref 6–20)
CO2: 26 mmol/L (ref 22–32)
Calcium: 9.7 mg/dL (ref 8.9–10.3)
Chloride: 102 mmol/L (ref 98–111)
Creatinine, Ser: 0.73 mg/dL (ref 0.44–1.00)
GFR calc Af Amer: >60 mL/min (ref >60)
GFR calc non Af Amer: >60 mL/min (ref >60)
Glucose, Bld: 104 mg/dL — ABNORMAL HIGH (ref 70–99)
Potassium: 3.9 mmol/L (ref 3.5–5.1)
Sodium: 138 mmol/L (ref 135–145)

## 2019-07-16 LAB — CBC
HCT: 39.4 % (ref 36.0–46.0)
Hemoglobin: 12.2 g/dL (ref 12.0–15.0)
MCH: 26.4 pg (ref 26.0–34.0)
MCHC: 31 g/dL (ref 30.0–36.0)
MCV: 85.3 fL (ref 80.0–100.0)
Platelets: 279 10*3/uL (ref 150–400)
RBC: 4.62 MIL/uL (ref 3.87–5.11)
RDW: 13.1 % (ref 11.5–15.5)
WBC: 9.7 10*3/uL (ref 4.0–10.5)
nRBC: 0 % (ref 0.0–0.2)

## 2019-07-16 LAB — I-STAT BETA HCG BLOOD, ED (MC, WL, AP ONLY): I-stat hCG, quantitative: <5 m[IU]/mL (ref <5)

## 2019-07-16 LAB — TROPONIN I (HIGH SENSITIVITY)
Troponin I (High Sensitivity): 3 ng/L (ref <18)
Troponin I (High Sensitivity): 3 ng/L (ref <18)

## 2019-07-16 MED ORDER — SODIUM CHLORIDE 0.9% FLUSH: 3.0000 mL | Freq: Once | INTRAVENOUS | Status: DC

## 2019-07-16 MED ORDER — CEPHALEXIN 250 MG PO CAPS
500.0000 mg | ORAL_CAPSULE | Freq: Once | ORAL | Status: AC
  Administered 2019-07-16: 500 mg via ORAL
  Filled 2019-07-16: qty 2

## 2019-07-16 MED ORDER — CEPHALEXIN 500 MG PO CAPS
500.0000 mg | ORAL_CAPSULE | Freq: Three times a day (TID) | ORAL | 0 refills | Status: DC
Start: 2019-07-16 — End: 2019-07-31

## 2019-07-16 MED ORDER — TRAMADOL HCL 50 MG PO TABS
50.0000 mg | ORAL_TABLET | Freq: Once | ORAL | Status: AC
  Administered 2019-07-16: 50 mg via ORAL
  Filled 2019-07-16: qty 1

## 2019-07-16 MED ORDER — HYDROCODONE-ACETAMINOPHEN 5-325 MG PO TABS: 1.0000 | ORAL_TABLET | ORAL | 0 refills | Status: DC | PRN

## 2019-07-16 NOTE — ED Triage Notes (Signed)
Pt presents to Ed POV. Pt c/o L breast pain. Pt says her asthma is also bothering her, tachycardic in triage.

## 2019-07-16 NOTE — ED Notes (Signed)
Pt wanting to leave as she has an appt with the ACT team for intake.  Alert and oriented without distress noted.

## 2019-07-16 NOTE — Discharge Instructions (Addendum)
Please check MyChart for the results of the ultrasound.  Work with your primary care provider to get an appointment for the mammogram.  Return if pain is getting worse, or if you start running a  fever.

## 2019-07-16 NOTE — ED Provider Notes (Signed)
[facility] EMERGENCY DEPARTMENT Provider Note   CSN: [id number] Arrival date & time: 07/16/19  0032   History Chief Complaint  Patient presents with  . Chest Pain    [name] is a 29 y.o. female.  The history is provided by the patient.  Chest Pain She has history of depression, schizoaffective disorder and comes in complaining of left breast pain for the last 2 weeks which got significantly worse over the last 24 hours.  Pain is diffuse through the breast and she rates it at 10/10.  It is worse with any pressure.  She denies any fever or chills or sweats.  She denies any trauma.  She did see her physician yesterday who has ordered a take a dose of ibuprofen without any benefit.  She is unable to take acetaminophen.  She had been admitted to [facility] and she had told them about her breast pain, but she states that they did not do anything for it..  As a second complaint, she states that there has been an exacerbation of her asthma.  She states that she has had a nonproductive cough but denies wheezing or dyspnea.  Past Medical History:  Diagnosis Date  . Acid reflux   . Anxiety   . Asthma    last attack 03/13/15 or 03/14/15  . Autism   . 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 Chronic constipation   . Depression   . Drug-seeking behavior   . Essential tremor   . Headache   . Overdose of acetaminophen 07/2017   and other meds  . Personality disorder ([place])   . Schizo-affective psychosis ([place])   . Schizoaffective disorder, bipolar type ([place])   . Seizures [facility])    Last seizure December 2017  . Sleep apnea     Patient Active Problem List   Diagnosis Date Noted  . Bipolar I disorder, most recent episode depressed ([place]) 07/03/2019  . MDD (major depressive disorder), recurrent severe, without psychosis ([place]) 07/01/2019  . Non-suicidal self harm 06/24/2019  . Discord with neighbors, lodgers and landlord 03/16/2019  . Bipolar I  disorder, most recent episode (or current) manic ([place]) 03/05/2019  . Desire for pregnancy 07/03/2018  . Drug overdose 07/22/2017  . Overdose 07/22/2017  . Intentional acetaminophen overdose ([place])   . Metabolic acidosis   . DUB (dysfunctional uterine bleeding) 11/22/2016  . Hyperprolactinemia ([place]) 08/20/2016  . Tachycardia 01/13/2016  . 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  . Seizure disorder ([place]) 08/08/2015  . Chronic migraine 07/27/2015  . Asthma 04/15/2015  . Schizoaffective disorder, bipolar type ([place]) 03/10/2014  . Borderline personality disorder ([place]) 03/10/2014  . PTSD (post-traumatic stress disorder) 03/10/2014  . Suicidal ideations   . Borderline personality disorder ([place]) 10/31/2013  . Autism spectrum disorder 06/15/2013    Past Surgical History:  Procedure Laterality Date  . MOUTH SURGERY  2009 or 2010     OB History    Gravida  2   Para  1   Term  1   Preterm      AB  1   Living  1     SAB  1   TAB      Ectopic      Multiple  0   Live Births  1           Family History  Problem Relation Age of Onset  . Mental illness Father   . Asthma Father   .  PDD Brother   . Seizures Brother     Social History   Tobacco Use  . Smoking status: Former Smoker    Packs/day: 0.00  . Smokeless tobacco: Never Used  . Tobacco comment: Smoked for 2  years age [age]  Vaping Use  . Vaping Use: Never used  Substance Use Topics  . Alcohol use: No    Alcohol/week: 1.0 standard drink    Types: 1 Standard drinks or equivalent per week  . Drug use: No    Comment: History of cocaine use at age 21 for 4 months    Home Medications Prior to Admission medications   Medication Sig Start Date End Date Taking? Authorizing Provider  albuterol (PROVENTIL HFA;VENTOLIN HFA) 108 (90 Base) MCG/ACT inhaler Inhale 1-2 puffs into the lungs every 6 (six) hours as needed for wheezing or shortness of breath.    [provider]  busPIRone (BUSPAR) 10 MG  tablet Take 1 tablet (10 mg total) by mouth 3 (three) times daily. 07/13/19   Connye Burkitt, NP  fluticasone-salmeterol (ADVAIR HFA) [phone] MCG/ACT inhaler Inhale 2 puffs into the lungs 2 (two) times daily. 08/03/15   Timmothy Euler, MD  hydrOXYzine (ATARAX/VISTARIL) 25 MG tablet Take 1 tablet (25 mg total) by mouth every 6 (six) hours as needed for anxiety. 07/06/19   Connye Burkitt, NP  neomycin-bacitracin-polymyxin (NEOSPORIN) OINT Apply 1 application topically as needed for irritation or wound care (for superficial cuts to left anterior wrist.). 07/13/19   Connye Burkitt, NP  pantoprazole (PROTONIX) 40 MG tablet Take 1 tablet (40 mg total) by mouth daily. 06/16/19   Lajean Saver, MD  risperiDONE (RISPERDAL) 2 MG tablet Take 1 tablet (2 mg total) by mouth in the morning. 07/06/19   Connye Burkitt, NP  risperiDONE (RISPERDAL) 3 MG tablet Take 1 tablet (3 mg total) by mouth at bedtime. 07/06/19   Connye Burkitt, NP  sertraline (ZOLOFT) 50 MG tablet Take 3 tablets (150 mg total) by mouth daily. 07/14/19   Connye Burkitt, NP  sucralfate (CARAFATE) 1 g tablet Take 1 tablet (1 g total) by mouth 4 (four) times daily -  with meals and at bedtime. 07/13/19   Connye Burkitt, NP  traZODone (DESYREL) 100 MG tablet Take 2 tablets (200 mg total) by mouth at bedtime as needed for sleep. 07/13/19   Connye Burkitt, NP    Allergies    Bee venom, Coconut flavor, Geodon [ziprasidone hcl], Haloperidol and related, Lithium, Oxycodone, Quetiapine, Seroquel [quetiapine fumarate], Shellfish allergy, Phenergan [promethazine hcl], Prilosec [omeprazole], Sulfa antibiotics, and Tegretol [carbamazepine]  Review of Systems   Review of Systems  Cardiovascular: Positive for chest pain.  All other systems reviewed and are negative.   Physical Exam Updated Vital Signs BP (!) 109/55 (BP Location: Left Arm)   Pulse 100   Temp 98.6 F (37 C) (Oral)   Resp 20   Ht 5\' 5"  (1.651 m)   Wt 93 kg   LMP 06/13/2019   SpO2 100%   BMI  34.11 kg/m   Physical Exam Vitals and nursing note reviewed.   29 year old female, resting comfortably and in no acute distress. Vital signs are normal. Oxygen saturation is 100%, which is normal. Head is normocephalic and atraumatic. PERRLA, EOMI. Oropharynx is clear. Neck is nontender and supple without adenopathy or JVD. Back is nontender and there is no CVA tenderness. Lungs are clear without rales, wheezes, or rhonchi. Chest: No erythema, warmth, swelling noted  of the breast.  Left breast has tenderness very deep to and slightly medial to the nipple.  There is also tenderness in the left axilla.  No discrete masses palpable.  There is no nipple discharge.Marland [place] Heart has regular rate and rhythm without murmur. Abdomen is soft, flat, nontender without masses or hepatosplenomegaly and peristalsis is normoactive. Extremities have no cyanosis or edema, full range of motion is present. Skin is warm and dry without rash. Neurologic: Mental status is normal, cranial nerves are intact, there are no motor or sensory deficits.  ED Results / Procedures / Treatments   Labs (all labs ordered are listed, but only abnormal results are displayed) Labs Reviewed  BASIC METABOLIC PANEL - Abnormal; Notable for the following components:      Result Value   Glucose, Bld 104 (*)    All other components within normal limits  CBC  I-STAT BETA HCG BLOOD, ED ([facility], [facility], [facility] ONLY)  TROPONIN I (HIGH SENSITIVITY)  TROPONIN I (HIGH SENSITIVITY)    EKG EKG Interpretation  Date/Time:  Thursday July 16 2019 00:38:49 EDT Ventricular Rate:  111 PR Interval:  120 QRS Duration: 84 QT Interval:  338 QTC Calculation: 459 R Axis:   50 Text Interpretation: Sinus tachycardia Possible Inferior infarct , age undetermined Cannot rule out Anterior infarct , age undetermined Abnormal ECG When compared with ECG of 06/24/2019, No significant change was found Confirmed by Delora Fuel ([id number]) on 07/16/2019 8:01:49  AM   Radiology DG Chest 2 View  Result Date: 07/16/2019 CLINICAL DATA:  Chest pain EXAM: CHEST - 2 VIEW COMPARISON:  01/19/2019 FINDINGS: The heart size and mediastinal contours are within normal limits. Both lungs are clear. The visualized skeletal structures are unremarkable. IMPRESSION: No active cardiopulmonary disease. Electronically Signed   By: Constance Holster M.D.   On: 07/16/2019 01:01    Procedures Procedures   Medications Ordered in ED Medications  sodium chloride flush (NS) 0.9 % injection 3 mL (has no administration in time range)  cephALEXin (KEFLEX) capsule 500 mg (has no administration in time range)  traMADol (ULTRAM) tablet 50 mg (50 mg Oral Given 07/16/19 3474)    ED Course  I have reviewed the triage vital signs and the nursing notes.  Pertinent labs & imaging results that were available during my care of the patient were reviewed by me and considered in my medical decision making (see chart for details).  MDM Rules/Calculators/A&P Left breast pain of uncertain cause.  She is not running a fever and WBC is normal.  ECG shows no acute changes.  Troponin is normal x2.  However, I am concerned about possible deep infection with tenderness in the axilla.  Will check ultrasound of the breast and, if negative, start on empiric antibiotics and have her proceed with mammogram as is being scheduled by her primary care provider.  No evidence of asthma exacerbation on exam.  Old records are reviewed, and she has no relevant past visits.  8:08 AM Ultrasound has been completed, but report is still pending.  Patient has an appointment with her ACT team and is anxious to go.  She got no relief of pain with tramadol, will give a prescription for small number of hydrocodone-acetaminophen tablets and will also discharge with prescription for cephalexin.  She is to continue working with her primary care provider to get her mammogram scheduled.  She is instructed to use my chart to  check report for the ultrasound.  Final Clinical Impression(s) / ED Diagnoses Final diagnoses:  Breast pain in female    Rx / DC Orders ED Discharge Orders         Ordered    HYDROcodone-acetaminophen (NORCO) 5-325 MG tablet  Every 4 hours PRN     Discontinue  Reprint     07/16/19 0804    cephALEXin (KEFLEX) 500 MG capsule  3 times daily     Discontinue  Reprint     07/16/19 3354           Delora Fuel, MD 56/25/63 [phone]

## 2019-07-17 ENCOUNTER — Other Ambulatory Visit: Payer: Self-pay | Admitting: Family Medicine

## 2019-07-17 DIAGNOSIS — N644 Mastodynia: Secondary | ICD-10-CM

## 2019-07-20 DIAGNOSIS — G4733 Obstructive sleep apnea (adult) (pediatric): Secondary | ICD-10-CM | POA: Diagnosis not present

## 2019-07-26 ENCOUNTER — Ambulatory Visit (HOSPITAL_COMMUNITY)
Admission: EM | Admit: 2019-07-26 | Discharge: 2019-07-27 | Disposition: A | Discharge status: Other Acute Inpatient Hospital | Payer: PPO | Attending: Nurse Practitioner | Admitting: Nurse Practitioner

## 2019-07-26 ENCOUNTER — Other Ambulatory Visit: Payer: Self-pay

## 2019-07-26 DIAGNOSIS — R45851 Suicidal ideations: Secondary | ICD-10-CM | POA: Insufficient documentation

## 2019-07-26 DIAGNOSIS — F419 Anxiety disorder, unspecified: Secondary | ICD-10-CM | POA: Insufficient documentation

## 2019-07-26 DIAGNOSIS — J45909 Unspecified asthma, uncomplicated: Secondary | ICD-10-CM | POA: Diagnosis not present

## 2019-07-26 DIAGNOSIS — Z818 Family history of other mental and behavioral disorders: Secondary | ICD-10-CM | POA: Diagnosis not present

## 2019-07-26 DIAGNOSIS — F840 Autistic disorder: Secondary | ICD-10-CM | POA: Diagnosis not present

## 2019-07-26 DIAGNOSIS — Z79899 Other long term (current) drug therapy: Secondary | ICD-10-CM | POA: Insufficient documentation

## 2019-07-26 DIAGNOSIS — F333 Major depressive disorder, recurrent, severe with psychotic symptoms: Secondary | ICD-10-CM

## 2019-07-26 DIAGNOSIS — F250 Schizoaffective disorder, bipolar type: Secondary | ICD-10-CM | POA: Diagnosis not present

## 2019-07-26 DIAGNOSIS — K219 Gastro-esophageal reflux disease without esophagitis: Secondary | ICD-10-CM | POA: Diagnosis not present

## 2019-07-26 DIAGNOSIS — Z148 Genetic carrier of other disease: Secondary | ICD-10-CM | POA: Insufficient documentation

## 2019-07-26 DIAGNOSIS — G250 Essential tremor: Secondary | ICD-10-CM | POA: Diagnosis not present

## 2019-07-26 DIAGNOSIS — Z20822 Contact with and (suspected) exposure to covid-19: Secondary | ICD-10-CM | POA: Insufficient documentation

## 2019-07-26 DIAGNOSIS — Z87891 Personal history of nicotine dependence: Secondary | ICD-10-CM | POA: Diagnosis not present

## 2019-07-26 DIAGNOSIS — F603 Borderline personality disorder: Secondary | ICD-10-CM | POA: Insufficient documentation

## 2019-07-26 DIAGNOSIS — G4730 Sleep apnea, unspecified: Secondary | ICD-10-CM | POA: Insufficient documentation

## 2019-07-26 LAB — POCT PREGNANCY, URINE: Preg Test, Ur: NEGATIVE

## 2019-07-26 LAB — POCT URINE DRUG SCREEN - MANUAL ENTRY (I-SCREEN)
POC Amphetamine UR: NOT DETECTED
POC Buprenorphine (BUP): NOT DETECTED
POC Cocaine UR: NOT DETECTED
POC Marijuana UR: NOT DETECTED
POC Methadone UR: NOT DETECTED
POC Methamphetamine UR: NOT DETECTED
POC Morphine: NOT DETECTED
POC Oxazepam (BZO): NOT DETECTED
POC Oxycodone UR: NOT DETECTED
POC Secobarbital (BAR): NOT DETECTED

## 2019-07-26 MED ORDER — MOMETASONE FURO-FORMOTEROL FUM 200-5 MCG/ACT IN AERO: 2.0000 | INHALATION_SPRAY | Freq: Two times a day (BID) | RESPIRATORY_TRACT | Status: DC

## 2019-07-26 MED ORDER — CEPHALEXIN 250 MG PO CAPS: 500.0000 mg | ORAL_CAPSULE | Freq: Three times a day (TID) | ORAL | Status: DC

## 2019-07-26 MED ORDER — BUSPIRONE HCL 5 MG PO TABS: 10.0000 mg | ORAL_TABLET | Freq: Three times a day (TID) | ORAL | Status: DC

## 2019-07-26 MED ORDER — MAGNESIUM HYDROXIDE 400 MG/5ML PO SUSP: 30.0000 mL | Freq: Every day | ORAL | Status: DC | PRN

## 2019-07-26 MED ORDER — ALBUTEROL SULFATE HFA 108 (90 BASE) MCG/ACT IN AERS: 1.0000 | INHALATION_SPRAY | Freq: Four times a day (QID) | RESPIRATORY_TRACT | Status: DC | PRN

## 2019-07-26 MED ORDER — TRAZODONE HCL 100 MG PO TABS
200.0000 mg | ORAL_TABLET | Freq: Every evening | ORAL | Status: DC | PRN
  Administered 2019-07-27: 200 mg via ORAL
  Filled 2019-07-26: qty 2

## 2019-07-26 MED ORDER — HYDROXYZINE HCL 25 MG PO TABS
25.0000 mg | ORAL_TABLET | Freq: Four times a day (QID) | ORAL | Status: DC | PRN
  Administered 2019-07-27: 25 mg via ORAL
  Filled 2019-07-26: qty 1

## 2019-07-26 MED ORDER — SERTRALINE HCL 50 MG PO TABS: 150.0000 mg | ORAL_TABLET | Freq: Every day | ORAL | Status: DC

## 2019-07-26 MED ORDER — ACETAMINOPHEN 325 MG PO TABS: 650.0000 mg | ORAL_TABLET | Freq: Four times a day (QID) | ORAL | Status: DC | PRN

## 2019-07-26 MED ORDER — PANTOPRAZOLE SODIUM 20 MG PO TBEC: 40.0000 mg | DELAYED_RELEASE_TABLET | Freq: Every day | ORAL | Status: DC

## 2019-07-26 MED ORDER — RISPERIDONE 2 MG PO TABS: 2.0000 mg | ORAL_TABLET | Freq: Every day | ORAL | Status: DC

## 2019-07-26 MED ORDER — SUCRALFATE 1 G PO TABS: 1.0000 g | ORAL_TABLET | Freq: Three times a day (TID) | ORAL | Status: DC

## 2019-07-26 MED ORDER — ALUM & MAG HYDROXIDE-SIMETH 200-200-20 MG/5ML PO SUSP: 30.0000 mL | ORAL | Status: DC | PRN

## 2019-07-26 MED ORDER — RISPERIDONE 3 MG PO TABS: 3.0000 mg | ORAL_TABLET | Freq: Every day | ORAL | Status: DC

## 2019-07-26 NOTE — ED Provider Notes (Signed)
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 submission of specimen other than nasopharyngeal swab, presence of viral mutation(s) within the areas targeted by this assay, and inadequate number of viral copies (<250 copies / mL). A negative result must be combined with clinical observations, patient history, and epidemiological information. Fact Sheet for Patients:   StrictlyIdeas.no Fact Sheet  for Healthcare Providers: BankingDealers.co.za This test is not yet approved or cleared                           by the [place] FDA and has been authorized for detection and/or diagnosis of SARS-CoV-2 by FDA under an Emergency Use Authorization (EUA).  This EUA will remain in effect (meaning this test can be used) for the duration of the COVID-19 declaration under Section 564(b)(1) of the Act, 21 U.S.C. section 360bbb-3(b)(1), unless the authorization is terminated or revoked sooner. Performed at [facility], [place] [address] [place], [place] [address]   Admission on 06/24/2019, Discharged on 06/24/2019  Component Date Value Ref Range Status  . Sodium 06/24/2019 139  135 - 145 mmol/L Final  . Potassium 06/24/2019 3.9  3.5 - 5.1 mmol/L Final  . Chloride 06/24/2019 105  98 - 111 mmol/L Final  . CO2 06/24/2019 23  22 - 32 mmol/L Final  . Glucose, Bld 06/24/2019 98  70 - 99 mg/dL Final   Glucose reference range applies only to samples taken after fasting for at least 8 hours.  . BUN 06/24/2019 8  6 - 20 mg/dL Final  . Creatinine, Ser 06/24/2019 0.72  0.44 - 1.00 mg/dL Final  . Calcium 06/24/2019 9.4  8.9 - 10.3 mg/dL Final  . Total Protein 06/24/2019 7.5  6.5 - 8.1 g/dL Final  . Albumin 06/24/2019 4.2  3.5 - 5.0 g/dL Final  . AST 06/24/2019 15  15 - 41 U/L Final  . ALT 06/24/2019 12  0 - 44 U/L Final  . Alkaline Phosphatase 06/24/2019 74  38 - 126 U/L Final  . Total Bilirubin 06/24/2019 0.6  0.3 - 1.2 mg/dL Final  . GFR calc non Af Amer 06/24/2019 >60  >60 mL/min Final  . GFR calc Af Amer 06/24/2019 >60  >60 mL/min Final  . Anion gap 06/24/2019 11  5 - 15 Final   Performed at [facility], [place] [address] [place], [place] [address]  . Alcohol, Ethyl (B) 06/24/2019 <10  <10 mg/dL Final   Comment: (NOTE) Lowest detectable limit for serum alcohol is 10 mg/dL. For medical purposes only. Performed at [facility], [place] [address] [place], [place] [address]   . Salicylate Lvl 12/02/2534 <7.0* 7.0 - 30.0  mg/dL Final   Performed at [facility], [place] [address], [place], [place] [address]  . Acetaminophen (Tylenol), Serum 06/24/2019 <10* 10 - 30 ug/mL Final   Comment: (NOTE) Therapeutic concentrations vary significantly. A range of 10-30 ug/mL  may be an effective concentration for many patients. However, some  are best treated at concentrations outside of this range. Acetaminophen concentrations >150 ug/mL at 4 hours after ingestion  and >50 ug/mL at 12 hours after ingestion are often associated with  toxic reactions. Performed at [facility], [place] [address] [place], [place] [address]   . WBC 06/24/2019 10.8* 4.0 - 10.5 K/uL Final  . RBC 06/24/2019 4.55  3.87 - 5.11 MIL/uL Final  . Hemoglobin 06/24/2019 12.3  12.0 - 15.0 g/dL Final  . HCT 06/24/2019 38.7  36 - 46 % Final  . MCV 06/24/2019 85.1  80.0 - 100.0 fL Final  . MCH 06/24/2019 27.0  26.0 - 34.0 pg Final  . MCHC 06/24/2019 31.8  30.0 - 36.0 g/dL Final  . RDW 06/24/2019 13.1  11.5 - 15.5 % Final  . Platelets 06/24/2019 256  150 - 400 K/uL Final  . nRBC 06/24/2019 0.0  0.0 - 0.2 % Final   Performed at [facility], [place] [address] [place], [place] [address]  . Opiates 06/24/2019 NONE DETECTED  NONE DETECTED Final  . Cocaine 06/24/2019 NONE DETECTED  NONE DETECTED Final  . Benzodiazepines 06/24/2019 NONE DETECTED  NONE DETECTED Final  . Amphetamines 06/24/2019 NONE DETECTED  NONE DETECTED Final  . Tetrahydrocannabinol 06/24/2019 NONE DETECTED  NONE DETECTED Final  . Barbiturates 06/24/2019 NONE DETECTED  NONE DETECTED Final   Comment: (NOTE) DRUG SCREEN FOR MEDICAL PURPOSES ONLY.  IF CONFIRMATION IS NEEDED FOR ANY PURPOSE, NOTIFY LAB WITHIN 5 DAYS. LOWEST DETECTABLE LIMITS FOR URINE DRUG SCREEN Drug Class                     Cutoff (ng/mL) Amphetamine and metabolites     1000 Barbiturate and metabolites    200 Benzodiazepine                 237 Tricyclics and metabolites     300 Opiates and metabolites        300 Cocaine and metabolites        300 THC                            50 Performed at [facility], [place] [address], [place], [place] [address]   . SARS Coronavirus 2 06/24/2019 NEGATIVE  NEGATIVE Final   Comment: (NOTE) SARS-CoV-2 target nucleic acids are NOT DETECTED. The SARS-CoV-2 RNA is generally detectable in upper and lower respiratory specimens during the acute phase of infection. The lowest concentration of SARS-CoV-2 viral copies this assay can detect is 250 copies / mL. A negative result does not preclude SARS-CoV-2 infection and should not be used as the sole basis for treatment or other patient management decisions.  A negative result may occur with improper specimen collection / handling, submission of specimen other than nasopharyngeal swab, presence of viral mutation(s) within the areas targeted by this assay, and inadequate number of viral copies (<250 copies / mL). A negative result must be combined with clinical observations, patient history, and epidemiological information. Fact Sheet for Patients:   StrictlyIdeas.no Fact Sheet for Healthcare Providers: BankingDealers.co.za This test is not yet approved or cleared  by the [place] and has been authorized for detection and/or diagnosis of SARS-CoV-2 by FDA under an Emergency Use Authorization (EUA).  This EUA will remain in effect (meaning this test can be used) for the duration of the COVID-19 declaration under Section 564(b)(1) of the Act, 21 U.S.C. section 360bbb-3(b)(1), unless the authorization is terminated or revoked sooner. Performed at [facility], [place] [address], [place], [place] [address]   . Preg Test, Ur 06/24/2019 NEGATIVE  NEGATIVE Final    Comment:        THE SENSITIVITY OF THIS METHODOLOGY IS >20 mIU/mL. Performed at [facility], [place] [address], [place], [place] [address]   Admission on 06/16/2019, Discharged on 06/16/2019  Component Date Value Ref Range Status  . Lipase 06/16/2019 44  11 - 51 U/L Final   Performed at [facility], [place] [address], [place], [place] [address]  . Sodium 06/16/2019 137  135 - 145 mmol/L Final  . Potassium 06/16/2019 4.0  3.5 - 5.1 mmol/L Final  . Chloride 06/16/2019 105  98 - 111 mmol/L Final  . CO2 06/16/2019 22  22 - 32 mmol/L Final  . Glucose, Bld 06/16/2019 108* 70 - 99 mg/dL Final   Glucose reference range applies only to samples taken after fasting for at least 8 hours.  . BUN 06/16/2019 9  6 - 20 mg/dL Final  . Creatinine, Ser 06/16/2019 0.64  0.44 - 1.00 mg/dL Final  . Calcium 06/16/2019 9.4  8.9 - 10.3 mg/dL Final  . Total Protein 06/16/2019 7.7  6.5 - 8.1 g/dL Final  . Albumin 06/16/2019 4.5  3.5 - 5.0 g/dL Final  . AST 06/16/2019 21  15 - 41 U/L Final  . ALT 06/16/2019 19  0 - 44 U/L Final  . Alkaline Phosphatase 06/16/2019 76  38 - 126 U/L Final  . Total Bilirubin 06/16/2019 0.2* 0.3 - 1.2 mg/dL Final  . GFR calc non Af Amer 06/16/2019 >60  >60 mL/min Final  . GFR calc Af Amer 06/16/2019 >60  >60 mL/min Final  . Anion gap 06/16/2019 10  5 - 15 Final   Performed at [facility], [place] [address], [place], [place] [address]  . WBC 06/16/2019 11.1* 4.0 - 10.5 K/uL Final  . RBC 06/16/2019 4.71  3.87 - 5.11 MIL/uL Final  . Hemoglobin 06/16/2019 12.6  12.0 - 15.0 g/dL Final  . HCT 06/16/2019 39.4  36 - 46 % Final  . MCV 06/16/2019 83.7  80.0 - 100.0 fL Final  . MCH 06/16/2019 26.8  26.0 - 34.0 pg Final  . MCHC 06/16/2019 32.0  30.0 - 36.0 g/dL Final  . RDW 06/16/2019 13.2  11.5 - 15.5 % Final  . Platelets 06/16/2019 271  150 - 400 K/uL Final  . nRBC 06/16/2019 0.0  0.0 - 0.2 % Final   Performed at [facility], [place] [address], [place], [place] [address]  . Color, Urine 06/16/2019 YELLOW  YELLOW Final  . APPearance 06/16/2019 CLEAR  CLEAR Final  . Specific Gravity, Urine 06/16/2019 1.016  1.005 - 1.030 Final  . pH 06/16/2019 5.0  5.0 - 8.0 Final  . Glucose, UA 06/16/2019 NEGATIVE  NEGATIVE mg/dL Final  . Hgb urine dipstick 06/16/2019 NEGATIVE  NEGATIVE Final  . Bilirubin Urine 06/16/2019 NEGATIVE  NEGATIVE Final  . Ketones, ur 06/16/2019 NEGATIVE  NEGATIVE mg/dL Final  . Protein, ur 06/16/2019 NEGATIVE  NEGATIVE mg/dL Final  . Nitrite 06/16/2019  NEGATIVE  NEGATIVE Final  . Chal[place] Guest 06/16/2019 NEGATIVE  NEGATIVE Final   Performed at [facility], [place] [address] [place], [place] [address]  . I-stat hCG, quantitative 06/16/2019 <5.0  <5 mIU/mL Final  . Comment 3 06/16/2019          Final   Comment:   GEST. AGE      CONC.  (mIU/mL)   <=1 WEEK        5 - 50     2 WEEKS       50 - 500     3 WEEKS       100 - 10,000     4 WEEKS     1,000 - 30,000        FEMALE AND NON-PREGNANT FEMALE:     LESS THAN 5 mIU/mL   . Opiates 06/16/2019 POSITIVE* NONE DETECTED Final  . Cocaine 06/16/2019 NONE DETECTED  NONE DETECTED Final  . Benzodiazepines 06/16/2019 NONE DETECTED  NONE DETECTED Final  . Amphetamines 06/16/2019 NONE DETECTED  NONE DETECTED Final  . Tetrahydrocannabinol 06/16/2019 NONE DETECTED  NONE DETECTED Final  . Barbiturates 06/16/2019 NONE DETECTED  NONE DETECTED Final   Comment: (NOTE) DRUG SCREEN FOR MEDICAL PURPOSES ONLY.  IF CONFIRMATION IS NEEDED FOR ANY PURPOSE, NOTIFY LAB WITHIN 5 DAYS. LOWEST DETECTABLE LIMITS FOR URINE DRUG SCREEN Drug Class                     Cutoff (ng/mL) Amphetamine and metabolites    1000 Barbiturate and metabolites    200 Benzodiazepine                 003 Tricyclics and metabolites     300 Opiates and metabolites        300 Cocaine and metabolites        300 THC                            50 Performed at [facility], [place] [address], [place], [place] [address]   Admission on 03/16/2019, Discharged on 03/16/2019  Component Date Value Ref Range Status  . WBC 03/16/2019 7.1  4.0 - 10.5 K/uL Final  . RBC 03/16/2019 4.52  3.87 - 5.11 MIL/uL Final  . Hemoglobin 03/16/2019 12.4  12.0 - 15.0 g/dL Final  . HCT 03/16/2019 39.2  36 - 46 % Final  . MCV 03/16/2019 86.7  80.0 - 100.0 fL Final  . MCH 03/16/2019 27.4  26.0 - 34.0 pg Final  . MCHC 03/16/2019 31.6  30.0 - 36.0 g/dL Final  . RDW 03/16/2019 13.2  11.5 - 15.5 % Final  . Platelets 03/16/2019 231  150 - 400 K/uL Final  . nRBC 03/16/2019 0.0  0.0 - 0.2 % Final  . Neutrophils Relative % 03/16/2019 66  % Final  . Neutro Abs 03/16/2019 4.7  1.7 - 7.7 K/uL Final  . Lymphocytes Relative 03/16/2019 26  % Final  . Lymphs Abs 03/16/2019 1.9  0.7 - 4.0 K/uL Final  . Monocytes Relative 03/16/2019 6  % Final  . Monocytes Absolute 03/16/2019 0.4  0 - 1 K/uL Final  . Eosinophils Relative 03/16/2019 1  % Final  . Eosinophils Absolute 03/16/2019 0.1  0 - 0 K/uL Final  . Basophils Relative 03/16/2019 0  % Final  . Basophils Absolute 03/16/2019 0.0  0 - 0 K/uL Final  . Immature Granulocytes 03/16/2019 1  %  Final  . Abs Immature Granulocytes 03/16/2019 0.07  0.00 - 0.07 K/uL Final   Performed at [place] [address], [place], [place] [address]  . Salicylate Lvl 35/00/9381 <7.0* 7.0 - 30.0 mg/dL Final   Performed at [place] [address] [place], [place] [address]  . Alcohol, Ethyl (B) 03/16/2019 <10  <10 mg/dL Final   Comment: (NOTE) Lowest detectable limit for serum alcohol is 10 mg/dL. For medical purposes only. Performed at [facility], [place] [address], [place], [place] [address]   . Sodium 03/16/2019 138  135 - 145 mmol/L Final  . Potassium 03/16/2019 3.9  3.5 - 5.1 mmol/L Final  . Chloride 03/16/2019 109  98 - 111 mmol/L Final  . CO2 03/16/2019 20* 22 - 32 mmol/L  Final  . Glucose, Bld 03/16/2019 110* 70 - 99 mg/dL Final  . BUN 03/16/2019 10  6 - 20 mg/dL Final  . Creatinine, Ser 03/16/2019 0.73  0.44 - 1.00 mg/dL Final  . Calcium 03/16/2019 8.8* 8.9 - 10.3 mg/dL Final  . Total Protein 03/16/2019 6.9  6.5 - 8.1 g/dL Final  . Albumin 03/16/2019 3.9  3.5 - 5.0 g/dL Final  . AST 03/16/2019 19  15 - 41 U/L Final  . ALT 03/16/2019 17  0 - 44 U/L Final  . Alkaline Phosphatase 03/16/2019 69  38 - 126 U/L Final  . Total Bilirubin 03/16/2019 0.4  0.3 - 1.2 mg/dL Final  . GFR calc non Af Amer 03/16/2019 >60  >60 mL/min Final  . GFR calc Af Amer 03/16/2019 >60  >60 mL/min Final  . Anion gap 03/16/2019 9  5 - 15 Final   Performed at [facility], [place] [address] [place], [place] [address]  . Acetaminophen (Tylenol), Serum 03/16/2019 <10* 10 - 30 ug/mL Final   Comment: (NOTE) Therapeutic concentrations vary significantly. A range of 10-30 ug/mL  may be an effective concentration for many patients. However, some  are best treated at concentrations outside of this range. Acetaminophen concentrations >150 ug/mL at 4 hours after ingestion  and >50 ug/mL at 12 hours after ingestion are often associated with  toxic reactions. Performed at [facility], [place] [address], [place], [place] [address]   . Opiates 03/16/2019 NONE DETECTED  NONE DETECTED Final  . Cocaine 03/16/2019 NONE DETECTED  NONE DETECTED Final  . Benzodiazepines 03/16/2019 NONE DETECTED  NONE DETECTED Final  . Amphetamines 03/16/2019 NONE DETECTED  NONE DETECTED Final  . Tetrahydrocannabinol 03/16/2019 NONE DETECTED  NONE DETECTED Final  . Barbiturates 03/16/2019 NONE DETECTED  NONE DETECTED Final   Comment: (NOTE) DRUG SCREEN FOR MEDICAL PURPOSES ONLY.  IF CONFIRMATION IS NEEDED FOR ANY PURPOSE, NOTIFY LAB WITHIN 5 DAYS. LOWEST DETECTABLE LIMITS FOR URINE DRUG SCREEN Drug Class                     Cutoff (ng/mL) Amphetamine and metabolites     1000 Barbiturate and metabolites    200 Benzodiazepine                 102 Tricyclics and metabolites     300 Opiates and metabolites        300 Cocaine and metabolites        300 THC                            50 Performed at [facility], [place] [address] [place], [place] [address]   . I-stat hCG, quantitative 03/16/2019 <5.0  <5 mIU/mL Final  . Comment 3 03/16/2019          Final   Comment:   GEST. AGE      CONC.  (mIU/mL)   <=1 WEEK        5 - 50     2 WEEKS       50 - 500     3 WEEKS       100 - 10,000     4 WEEKS     1,000 - 30,000        FEMALE AND NON-PREGNANT FEMALE:     LESS THAN 5 mIU/mL   . SARS Coronavirus 2 by RT PCR 03/16/2019 NEGATIVE  NEGATIVE Final   Comment: (NOTE) SARS-CoV-2 target nucleic acids are NOT DETECTED. The SARS-CoV-2 RNA is generally detectable in upper respiratoy specimens during the acute phase of infection. The lowest concentration of SARS-CoV-2 viral copies this assay can detect is 131 copies/mL. A negative result does not preclude SARS-Cov-2 infection and should not be used as the sole basis for treatment or other patient management decisions. A negative result may occur with  improper specimen collection/handling, submission of specimen other than nasopharyngeal swab, presence of viral mutation(s) within the areas targeted by this assay, and inadequate number of viral copies (<131 copies/mL). A negative result must be combined with clinical observations, patient history, and epidemiological information. The expected result is Negative. Fact Sheet for Patients:  PinkCheek.be Fact Sheet for Healthcare Providers:  GravelBags.it This test is not yet ap                          proved or cleared by the [place] FDA and  has been authorized for detection and/or diagnosis of SARS-CoV-2 by FDA under an Emergency Use Authorization (EUA). This EUA will remain  in effect  (meaning this test can be used) for the duration of the COVID-19 declaration under Section 564(b)(1) of the Act, 21 U.S.C. section 360bbb-3(b)(1), unless the authorization is terminated or revoked sooner.   . Influenza A by PCR 03/16/2019 NEGATIVE  NEGATIVE Final  . Influenza B by PCR 03/16/2019 NEGATIVE  NEGATIVE Final   Comment: (NOTE) The Xpert Xpress SARS-CoV-2/FLU/RSV assay is intended as an aid in  the diagnosis of influenza from Nasopharyngeal swab specimens and  should not be used as a sole basis for treatment. Nasal washings and  aspirates are unacceptable for Xpert Xpress SARS-CoV-2/FLU/RSV  testing. Fact Sheet for Patients: PinkCheek.be Fact Sheet for Healthcare Providers: GravelBags.it This test is not yet approved or cleared by the [place] FDA and  has been authorized for detection and/or diagnosis of SARS-CoV-2 by  FDA under an Emergency Use Authorization (EUA). This EUA will remain  in effect (meaning this test can be used) for the duration of the  Covid-19 declaration under Section 564(b)(1) of the Act, 21  U.S.C. section 360bbb-3(b)(1), unless the authorization is  terminated or revoked. Performed at [facility], [place] [address] [place], [place] [address]   Admission on 03/05/2019, Discharged on 03/07/2019  Component Date Value Ref Range Status  . Hgb A1c MFr Bld 03/06/2019 4.8  4.8 - 5.6 % Final   Comment: (NOTE) Pre diabetes:          5.7%-6.4% Diabetes:              >6.4%  Glycemic control for   <7.0% adults with diabetes   . Mean Plasma Glucose 03/06/2019 91.06  mg/dL Final   Performed at [place] [address] [place], [place] [address]  . Cholesterol 03/06/2019 146  0 - 200 mg/dL Final  . Triglycerides 03/06/2019 57  <150 mg/dL Final  . HDL 03/06/2019 43  >40 mg/dL Final  . Total CHOL/HDL Ratio 03/06/2019 3.4  RATIO Final  . VLDL 03/06/2019 11  0 - 40 mg/dL  Final  . LDL Cholesterol 03/06/2019 92  0 - 99 mg/dL Final   Comment:        Total Cholesterol/HDL:CHD Risk Coronary Heart Disease Risk Table                     Men   Women  1/2 Average Risk   3.4   3.3  Average Risk       5.0   4.4  2 X Average Risk   9.6   7.1  3 X Average Risk  23.4   11.0        Use the calculated Patient Ratio above and the CHD Risk Table to determine the patient's CHD Risk.        ATP III CLASSIFICATION (LDL):  <100     mg/dL   Optimal  100-129  mg/dL   Near or Above                    Optimal  130-159  mg/dL   Borderline  160-189  mg/dL   High  >190     mg/dL   Very High Performed at [place] [address] [place], [place] [address]   . TSH 03/06/2019 2.056  0.350 - 4.500 uIU/mL Final   Comment: Performed by a 3rd Generation assay with a functional sensitivity of <=0.01 uIU/mL. Performed at [facility], [place] [address], [place], [place] [address]   Admission on 03/04/2019, Discharged on 03/05/2019  Component Date Value Ref Range Status  . I-stat hCG, quantitative 03/05/2019 <5.0  <5 mIU/mL Final  . Comment 3 03/05/2019          Final   Comment:   GEST. AGE      CONC.  (mIU/mL)   <=1 WEEK        5 - 50     2 WEEKS       50 - 500     3 WEEKS       100 - 10,000     4 WEEKS     1,000 - 30,000        FEMALE AND NON-PREGNANT FEMALE:     LESS THAN 5 mIU/mL   . Glucose-Capillary 03/05/2019 84  70 - 99 mg/dL Final  . Sodium 03/05/2019 139  135 - 145 mmol/L Final  . Potassium 03/05/2019 3.9  3.5 - 5.1 mmol/L Final  . Chloride 03/05/2019 105  98 - 111 mmol/L Final  . BUN 03/05/2019 4* 6 - 20 mg/dL Final  . Creatinine, Ser 03/05/2019 0.60  0.44 - 1.00 mg/dL Final  . Glucose, Bld 03/05/2019 91  70 - 99 mg/dL Final  . Calcium, Ion 03/05/2019 1.14* 1.15 - 1.40 mmol/L Final  . TCO2 03/05/2019 22  22 - 32 mmol/L Final  . Hemoglobin 03/05/2019 15.0  12.0 - 15.0 g/dL Final  . HCT 03/05/2019 44.0  36 - 46 % Final  .  WBC 03/05/2019 8.8  4.0 -  10.5 K/uL Final  . RBC 03/05/2019 5.95* 3.87 - 5.11 MIL/uL Final  . Hemoglobin 03/05/2019 15.9* 12.0 - 15.0 g/dL Final  . HCT 03/05/2019 51.7* 36 - 46 % Final  . MCV 03/05/2019 86.9  80.0 - 100.0 fL Final  . MCH 03/05/2019 26.7  26.0 - 34.0 pg Final  . MCHC 03/05/2019 30.8  30.0 - 36.0 g/dL Final  . RDW 03/05/2019 12.9  11.5 - 15.5 % Final  . Platelets 03/05/2019 193  150 - 400 K/uL Final  . nRBC 03/05/2019 0.0  0.0 - 0.2 % Final   Performed at [facility], [place] [address], [place], [place] [address]  . Alcohol, Ethyl (B) 03/05/2019 <10  <10 mg/dL Final   Comment: (NOTE) Lowest detectable limit for serum alcohol is 10 mg/dL. For medical purposes only. Performed at [facility], [place] [address], [place], [place] [address]   . Opiates 03/05/2019 NONE DETECTED  NONE DETECTED Final  . Cocaine 03/05/2019 NONE DETECTED  NONE DETECTED Final  . Benzodiazepines 03/05/2019 NONE DETECTED  NONE DETECTED Final  . Amphetamines 03/05/2019 NONE DETECTED  NONE DETECTED Final  . Tetrahydrocannabinol 03/05/2019 NONE DETECTED  NONE DETECTED Final  . Barbiturates 03/05/2019 NONE DETECTED  NONE DETECTED Final   Comment: (NOTE) DRUG SCREEN FOR MEDICAL PURPOSES ONLY.  IF CONFIRMATION IS NEEDED FOR ANY PURPOSE, NOTIFY LAB WITHIN 5 DAYS. LOWEST DETECTABLE LIMITS FOR URINE DRUG SCREEN Drug Class                     Cutoff (ng/mL) Amphetamine and metabolites    1000 Barbiturate and metabolites    200 Benzodiazepine                 269 Tricyclics and metabolites     300 Opiates and metabolites        300 Cocaine and metabolites        300 THC                            50 Performed at [facility], [place] [address] [place],  [address]   . SARS Coronavirus 2 by RT PCR 03/05/2019 NEGATIVE  NEGATIVE Final   Comment: (NOTE) SARS-CoV-2 target nucleic acids are NOT DETECTED. The SARS-CoV-2 RNA is generally detectable in upper respiratoy specimens during the  acute phase of infection. The lowest concentration of SARS-CoV-2 viral copies this assay can detect is 131 copies/mL. A negative result does not preclude SARS-Cov-2 infection and should not be used as the sole basis for treatment or other patient management decisions. A negative result may occur with  improper specimen collection/handling, submission of specimen other than nasopharyngeal swab, presence of viral mutation(s) within the areas targeted by this assay, and inadequate number of viral copies (<131 copies/mL). A negative result must be combined with clinical observations, patient history, and epidemiological information. The expected result is Negative. Fact Sheet for Patients:  PinkCheek.be Fact Sheet for Healthcare Providers:  GravelBags.it This test is not yet ap                          proved or cleared by the [place] FDA and  has been authorized for detection and/or diagnosis of SARS-CoV-2 by FDA under an Emergency Use Authorization (EUA). This EUA will remain  in effect (meaning this test can be used) for the duration of the COVID-19 declaration  under Section 564(b)(1) of the Act, 21 U.S.C. section 360bbb-3(b)(1), unless the authorization is terminated or revoked sooner.   . Influenza A by PCR 03/05/2019 NEGATIVE  NEGATIVE Final  . Influenza B by PCR 03/05/2019 NEGATIVE  NEGATIVE Final   Comment: (NOTE) The Xpert Xpress SARS-CoV-2/FLU/RSV assay is intended as an aid in  the diagnosis of influenza from Nasopharyngeal swab specimens and  should not be used as a sole basis for treatment. Nasal washings and  aspirates are unacceptable for Xpert Xpress SARS-CoV-2/FLU/RSV  testing. Fact Sheet for Patients: PinkCheek.be Fact Sheet for Healthcare Providers: GravelBags.it This test is not yet approved or cleared by the [place] FDA and  has been  authorized for detection and/or diagnosis of SARS-CoV-2 by  FDA under an Emergency Use Authorization (EUA). This EUA will remain  in effect (meaning this test can be used) for the duration of the  Covid-19 declaration under Section 564(b)(1) of the Act, 21  U.S.C. section 360bbb-3(b)(1), unless the authorization is  terminated or revoked. Performed at [facility], [place] [address] [place], [place] [address]   There may be more visits with results that are not included.    Allergies: Bee venom, Coconut flavor, Geodon [ziprasidone hcl], Haloperidol and related, Lithium, Oxycodone, Quetiapine, Seroquel [quetiapine fumarate], Shellfish allergy, Phenergan [promethazine hcl], Prilosec [omeprazole], Sulfa antibiotics, and Tegretol [carbamazepine]  PTA Medications: (Not in a hospital admission)    Recommendations  Based on my evaluation the patient does not appear to have an emergency medical condition.  Patient will be placed in continuous assessment area.  She will be reevaluated on 07/27/2019 with disposition to be determined at that time.  Resumed home medications:  Buspar 10 mg TID for anxiety Vistaril 25 mg every 6 hours prn for anxiety Risperdal 2 mg daily for mood stability Risperdal 3 mg QHS for mood stability Zoloft 150 mg daily for depression Trazodone 200 mg QHS for sleep Carafate 1 tablet QID with meals and at HS for ulcer Protonix 40 mg daily for GERD Albuterol INH 2 puffs every 6 hours prn for SOB/Wheezing Dulera 200-5 MCG ACT inhaler 2 puffs BID   Rozetta Nunnery, NP 07/27/19  1:01 AM

## 2019-07-27 ENCOUNTER — Emergency Department (HOSPITAL_COMMUNITY): Payer: PPO

## 2019-07-27 ENCOUNTER — Encounter (HOSPITAL_COMMUNITY): Payer: Self-pay | Admitting: Emergency Medicine

## 2019-07-27 ENCOUNTER — Ambulatory Visit (HOSPITAL_COMMUNITY)
Admission: EM | Admit: 2019-07-27 | Discharge: 2019-07-27 | Disposition: A | Discharge status: Home / Self Care | Payer: PPO | Attending: Psychiatry | Admitting: Psychiatry

## 2019-07-27 ENCOUNTER — Emergency Department (HOSPITAL_COMMUNITY)
Admission: EM | Admit: 2019-07-27 | Discharge: 2019-07-27 | Disposition: A | Discharge status: Home / Self Care | Payer: PPO | Attending: Emergency Medicine | Admitting: Emergency Medicine

## 2019-07-27 ENCOUNTER — Other Ambulatory Visit: Payer: Self-pay

## 2019-07-27 DIAGNOSIS — X58XXXA Exposure to other specified factors, initial encounter: Secondary | ICD-10-CM | POA: Diagnosis not present

## 2019-07-27 DIAGNOSIS — F333 Major depressive disorder, recurrent, severe with psychotic symptoms: Secondary | ICD-10-CM | POA: Diagnosis not present

## 2019-07-27 DIAGNOSIS — Z20822 Contact with and (suspected) exposure to covid-19: Secondary | ICD-10-CM | POA: Diagnosis not present

## 2019-07-27 DIAGNOSIS — F419 Anxiety disorder, unspecified: Secondary | ICD-10-CM | POA: Diagnosis present

## 2019-07-27 DIAGNOSIS — R0789 Other chest pain: Secondary | ICD-10-CM | POA: Insufficient documentation

## 2019-07-27 DIAGNOSIS — R45851 Suicidal ideations: Secondary | ICD-10-CM

## 2019-07-27 DIAGNOSIS — Y9289 Other specified places as the place of occurrence of the external cause: Secondary | ICD-10-CM | POA: Insufficient documentation

## 2019-07-27 DIAGNOSIS — J45909 Unspecified asthma, uncomplicated: Secondary | ICD-10-CM | POA: Insufficient documentation

## 2019-07-27 DIAGNOSIS — T182XXA Foreign body in stomach, initial encounter: Secondary | ICD-10-CM | POA: Diagnosis not present

## 2019-07-27 DIAGNOSIS — F603 Borderline personality disorder: Secondary | ICD-10-CM

## 2019-07-27 DIAGNOSIS — Z79899 Other long term (current) drug therapy: Secondary | ICD-10-CM | POA: Insufficient documentation

## 2019-07-27 DIAGNOSIS — F29 Unspecified psychosis not due to a substance or known physiological condition: Secondary | ICD-10-CM | POA: Diagnosis not present

## 2019-07-27 DIAGNOSIS — Z01812 Encounter for preprocedural laboratory examination: Secondary | ICD-10-CM | POA: Insufficient documentation

## 2019-07-27 DIAGNOSIS — F840 Autistic disorder: Secondary | ICD-10-CM | POA: Insufficient documentation

## 2019-07-27 DIAGNOSIS — Y9389 Activity, other specified: Secondary | ICD-10-CM | POA: Insufficient documentation

## 2019-07-27 DIAGNOSIS — Y998 Other external cause status: Secondary | ICD-10-CM | POA: Diagnosis not present

## 2019-07-27 DIAGNOSIS — R112 Nausea with vomiting, unspecified: Secondary | ICD-10-CM | POA: Insufficient documentation

## 2019-07-27 DIAGNOSIS — R079 Chest pain, unspecified: Secondary | ICD-10-CM | POA: Diagnosis not present

## 2019-07-27 DIAGNOSIS — R202 Paresthesia of skin: Secondary | ICD-10-CM | POA: Insufficient documentation

## 2019-07-27 DIAGNOSIS — F250 Schizoaffective disorder, bipolar type: Secondary | ICD-10-CM | POA: Insufficient documentation

## 2019-07-27 DIAGNOSIS — R442 Other hallucinations: Secondary | ICD-10-CM | POA: Diagnosis not present

## 2019-07-27 DIAGNOSIS — Z87891 Personal history of nicotine dependence: Secondary | ICD-10-CM | POA: Diagnosis not present

## 2019-07-27 DIAGNOSIS — I10 Essential (primary) hypertension: Secondary | ICD-10-CM | POA: Diagnosis not present

## 2019-07-27 LAB — CBC
HCT: 40.2 % (ref 36.0–46.0)
Hemoglobin: 12.6 g/dL (ref 12.0–15.0)
MCH: 26 pg (ref 26.0–34.0)
MCHC: 31.3 g/dL (ref 30.0–36.0)
MCV: 83.1 fL (ref 80.0–100.0)
Platelets: 251 10*3/uL (ref 150–400)
RBC: 4.84 MIL/uL (ref 3.87–5.11)
RDW: 12.4 % (ref 11.5–15.5)
WBC: 10 10*3/uL (ref 4.0–10.5)
nRBC: 0 % (ref 0.0–0.2)

## 2019-07-27 LAB — BASIC METABOLIC PANEL
Anion gap: 10 (ref 5–15)
BUN: 17 mg/dL (ref 6–20)
CO2: 25 mmol/L (ref 22–32)
Calcium: 9.5 mg/dL (ref 8.9–10.3)
Chloride: 103 mmol/L (ref 98–111)
Creatinine, Ser: 0.89 mg/dL (ref 0.44–1.00)
GFR calc Af Amer: >60 mL/min (ref >60)
GFR calc non Af Amer: >60 mL/min (ref >60)
Glucose, Bld: 95 mg/dL (ref 70–99)
Potassium: 3.8 mmol/L (ref 3.5–5.1)
Sodium: 138 mmol/L (ref 135–145)

## 2019-07-27 LAB — TROPONIN I (HIGH SENSITIVITY)
Troponin I (High Sensitivity): <2 ng/L (ref <18)
Troponin I (High Sensitivity): <2 ng/L (ref <18)

## 2019-07-27 LAB — I-STAT BETA HCG BLOOD, ED (MC, WL, AP ONLY): I-stat hCG, quantitative: <5 m[IU]/mL (ref <5)

## 2019-07-27 LAB — ACETAMINOPHEN LEVEL: Acetaminophen (Tylenol), Serum: <10 ug/mL — ABNORMAL LOW (ref 10–30)

## 2019-07-27 LAB — SALICYLATE LEVEL: Salicylate Lvl: <7 mg/dL — ABNORMAL LOW (ref 7.0–30.0)

## 2019-07-27 LAB — POC SARS CORONAVIRUS 2 AG: SARS Coronavirus 2 Ag: NEGATIVE

## 2019-07-27 LAB — ETHANOL: Alcohol, Ethyl (B): <10 mg/dL (ref <10)

## 2019-07-27 MED ORDER — SUCRALFATE 1 G PO TABS
1.0000 g | ORAL_TABLET | Freq: Three times a day (TID) | ORAL | Status: DC
  Administered 2019-07-27 (×2): 1 g via ORAL
  Filled 2019-07-27 (×2): qty 1

## 2019-07-27 MED ORDER — ALBUTEROL SULFATE HFA 108 (90 BASE) MCG/ACT IN AERS
1.0000 | INHALATION_SPRAY | Freq: Four times a day (QID) | RESPIRATORY_TRACT | Status: DC | PRN
  Administered 2019-07-27: 2 via RESPIRATORY_TRACT
  Filled 2019-07-27: qty 6.7

## 2019-07-27 MED ORDER — MAGNESIUM HYDROXIDE 400 MG/5ML PO SUSP
30.0000 mL | Freq: Every day | ORAL | Status: DC | PRN
Start: 2019-07-27 — End: 2019-07-27

## 2019-07-27 MED ORDER — RISPERIDONE 2 MG PO TABS
2.0000 mg | ORAL_TABLET | Freq: Every morning | ORAL | Status: DC
  Administered 2019-07-27: 2 mg via ORAL
  Filled 2019-07-27: qty 1

## 2019-07-27 MED ORDER — SERTRALINE HCL 50 MG PO TABS
150.0000 mg | ORAL_TABLET | Freq: Every day | ORAL | Status: DC
  Administered 2019-07-27: 150 mg via ORAL
  Filled 2019-07-27: qty 1

## 2019-07-27 MED ORDER — PANTOPRAZOLE SODIUM 40 MG PO TBEC
40.0000 mg | DELAYED_RELEASE_TABLET | Freq: Every day | ORAL | Status: DC
  Administered 2019-07-27: 40 mg via ORAL
  Filled 2019-07-27 (×2): qty 1

## 2019-07-27 MED ORDER — BUSPIRONE HCL 10 MG PO TABS
10.0000 mg | ORAL_TABLET | Freq: Three times a day (TID) | ORAL | Status: DC
  Administered 2019-07-27: 10 mg via ORAL
  Filled 2019-07-27: qty 1

## 2019-07-27 MED ORDER — LORAZEPAM 1 MG PO TABS
1.0000 mg | ORAL_TABLET | Freq: Once | ORAL | Status: AC
  Administered 2019-07-27: 1 mg via ORAL
  Filled 2019-07-27: qty 1

## 2019-07-27 MED ORDER — TRAZODONE HCL 50 MG PO TABS: 200.0000 mg | ORAL_TABLET | Freq: Every evening | ORAL | Status: DC | PRN

## 2019-07-27 MED ORDER — RISPERIDONE 3 MG PO TABS
3.0000 mg | ORAL_TABLET | Freq: Every day | ORAL | Status: DC
  Filled 2019-07-27: qty 1

## 2019-07-27 MED ORDER — ASPIRIN 81 MG PO CHEW: 324.0000 mg | CHEWABLE_TABLET | Freq: Once | ORAL | Status: AC

## 2019-07-27 MED ORDER — SODIUM CHLORIDE 0.9% FLUSH: 3.0000 mL | Freq: Once | INTRAVENOUS | Status: DC

## 2019-07-27 MED ORDER — ALUM & MAG HYDROXIDE-SIMETH 200-200-20 MG/5ML PO SUSP: 30.0000 mL | ORAL | Status: DC | PRN

## 2019-07-27 MED ORDER — ACETAMINOPHEN 325 MG PO TABS: 650.0000 mg | ORAL_TABLET | Freq: Four times a day (QID) | ORAL | Status: DC | PRN

## 2019-07-27 MED ORDER — ASPIRIN 81 MG PO CHEW
CHEWABLE_TABLET | ORAL | Status: AC
  Administered 2019-07-27: 324 mg via ORAL
  Filled 2019-07-27: qty 4
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
## 2019-07-27 NOTE — Progress Notes (Addendum)
Patient reports new onset epigastric pain of 8/10, shortness of breath, and nausea.  Denies that pain is radiating. Denies recent injury. B/P 118/79, HR 92, O2 sat 100% on RA.
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
## 2019-07-27 NOTE — ED Notes (Signed)
Lunch Tray Ordered @ 1050. 

## 2019-07-27 NOTE — ED Notes (Signed)
Pt belongings in locker #29. 

## 2019-07-27 NOTE — ED Notes (Signed)
Unsuccessful blood draw x2 

## 2019-07-27 NOTE — ED Provider Notes (Signed)
Per Dr. Dwyane Dee patient does not meet inpatient criteria and is discharged form [facility] and has agreed to follow up. Agree with assessment by TTS and ED provider.

## 2019-07-27 NOTE — ED Notes (Signed)
Patient given paper scrubs to wear but refused to remove her bra despite repeated request by nurse and EMT at triage .

## 2019-07-27 NOTE — ED Triage Notes (Addendum)
Patient arrived with EMS from behavior health reports left chest pain with SOB and emesis this evening , no fever or cough . Patient added suicidal ideation plans to stab herself with auditory hallucinations .

## 2019-07-27 NOTE — Discharge Instructions (Addendum)
1.  Return directly to the behavioral health Hospital for evaluation and treatment for suicidal thoughts. 2.  You swallowed a piece of your face mask while in the emergency department.  This should pass on its own.  You may eat and drink normally.  Examine your stool for evidence of the bit that you swallowed. 3.  A repeat x-ray of the chest and abdomen will be needed in 2 days.

## 2019-07-27 NOTE — ED Triage Notes (Signed)
Pt presents with SI, hearing voices telling her to kill herself.

## 2019-07-27 NOTE — ED Notes (Signed)
Charge Nurse at [facility] ED notified of EMS transfer for evaluation of chest pain.

## 2019-07-27 NOTE — Discharge Instructions (Signed)
Continue current medications Follow up with Outpatient providers

## 2019-07-27 NOTE — ED Notes (Signed)
PT states she swallowed the metal out of her mask because she was angry. No mask found on pt's bed/bedding or on the floor.  Sitter states she did not see the pt swallow anything. Dr Johnney Killian notified.  X-ray ordered and radiology called.

## 2019-07-27 NOTE — ED Notes (Signed)
Blood draw attempted x2 with no success.

## 2019-07-27 NOTE — ED Notes (Signed)
Pt voiced complaint of Left chest pain, rates as 8 on pain scale at present.  NP [name] notified.  911 called.  As per dispatch 4 81 mg ASA given. Chew and swallow.

## 2019-07-27 NOTE — ED Notes (Signed)
Pt A&O x 4, presents with SI, plan to cut self with knife.  Pt reports hearing voices telling her to kill herself.  Denies HI.  Skin search completed, monitoring for safety.  Pt resting at present.

## 2019-07-27 NOTE — ED Provider Notes (Addendum)
[facility] EMERGENCY DEPARTMENT Provider Note   CSN: [id number] Arrival date & time: 07/27/19  0138     History Chief Complaint  Patient presents with  . Chest Pain  . Suicidal    [name] is a 29 y.o. female.  HPI Has been treated at behavioral health Hospital for suicidal ideation and anxiety.  Patient reports it is multifactorial.  She has many social stressors at this time.  She reports yesterday evening about 8 PM she suddenly got a sharp pain in her left upper chest.  She reports it was aching in quality.  She felt a tingling sensation in her left arm.  She reports she felt short of breath at the time.  She reports she got nauseated and vomited once.  She reports that it got a little better after she vomited.  She reports that after about 2 hours it had spontaneously resolved.  She has not had any fever chills body aches or cough.  Patient has had Covid immunization 2 doses, last dose greater than 2 weeks ago.  No pain burning urgency with urination.  No abdominal pain.  No swelling of the legs or pain in the calves.  Patient reports she has gotten similar episodes of pain in the past with anxiety.  She reports she does have problems with reflux and takes Protonix daily although she has not had her a.m. dose today.    Past Medical History:  Diagnosis Date  . Acid reflux   . Anxiety   . Asthma    last attack 03/13/15 or 03/14/15  . Autism   . 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 Chronic constipation   . Depression   . Drug-seeking behavior   . Essential tremor   . Headache   . Overdose of acetaminophen 07/2017   and other meds  . Personality disorder ([place])   . Schizo-affective psychosis ([place])   . Schizoaffective disorder, bipolar type ([place])   . Seizures [facility])    Last seizure December 2017  . Sleep apnea     Patient Active Problem List   Diagnosis Date Noted  . Bipolar I disorder, most recent episode depressed ([place]) 07/03/2019  . MDD (major  depressive disorder), recurrent severe, without psychosis ([place]) 07/01/2019  . Non-suicidal self harm 06/24/2019  . Discord with neighbors, lodgers and landlord 03/16/2019  . Bipolar I disorder, most recent episode (or current) manic ([place]) 03/05/2019  . Desire for pregnancy 07/03/2018  . Drug overdose 07/22/2017  . Overdose 07/22/2017  . Intentional acetaminophen overdose ([place])   . Metabolic acidosis   . DUB (dysfunctional uterine bleeding) 11/22/2016  . Hyperprolactinemia ([place]) 08/20/2016  . Tachycardia 01/13/2016  . 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  . Seizure disorder ([place]) 08/08/2015  . Chronic migraine 07/27/2015  . Asthma 04/15/2015  . Schizoaffective disorder, bipolar type ([place]) 03/10/2014  . Borderline personality disorder ([place]) 03/10/2014  . PTSD (post-traumatic stress disorder) 03/10/2014  . Suicidal ideations   . Borderline personality disorder ([place]) 10/31/2013  . Autism spectrum disorder 06/15/2013    Past Surgical History:  Procedure Laterality Date  . MOUTH SURGERY  2009 or 2010     OB History    Gravida  2   Para  1   Term  1   Preterm      AB  1   Living  1     SAB  1   TAB      Ectopic  Multiple  0   Live Births  1           Family History  Problem Relation Age of Onset  . Mental illness Father   . Asthma Father   . PDD Brother   . Seizures Brother     Social History   Tobacco Use  . Smoking status: Former Smoker    Packs/day: 0.00  . Smokeless tobacco: Never Used  . Tobacco comment: Smoked for 2  years age [age]  Vaping Use  . Vaping Use: Never used  Substance Use Topics  . Alcohol use: No    Alcohol/week: 1.0 standard drink    Types: 1 Standard drinks or equivalent per week  . Drug use: No    Comment: History of cocaine use at age 63 for 4 months    Home Medications Prior to Admission medications   Medication Sig Start Date End Date Taking? Authorizing Provider  albuterol (PROVENTIL HFA;VENTOLIN HFA)  108 (90 Base) MCG/ACT inhaler Inhale 1-2 puffs into the lungs every 6 (six) hours as needed for wheezing or shortness of breath.   Yes [provider]  busPIRone (BUSPAR) 10 MG tablet Take 1 tablet (10 mg total) by mouth 3 (three) times daily. 07/13/19  Yes Connye Burkitt, NP  cephALEXin (KEFLEX) 500 MG capsule Take 1 capsule (500 mg total) by mouth 3 (three) times daily. 9/79/48  Yes Delora Fuel, MD  fluticasone-salmeterol (ADVAIR HFA) [phone] MCG/ACT inhaler Inhale 2 puffs into the lungs 2 (two) times daily. 08/03/15  Yes Timmothy Euler, MD  HYDROcodone-acetaminophen (NORCO) 5-325 MG tablet Take 1 tablet by mouth every 4 (four) hours as needed for moderate pain. 3/74/82  Yes Delora Fuel, MD  hydrOXYzine (ATARAX/VISTARIL) 25 MG tablet Take 1 tablet (25 mg total) by mouth every 6 (six) hours as needed for anxiety. 07/06/19  Yes Connye Burkitt, NP  Multiple Vitamin (MULTIVITAMIN) tablet Take 1 tablet by mouth daily. Gummies.   Yes [provider]  neomycin-bacitracin-polymyxin (NEOSPORIN) OINT Apply 1 application topically as needed for irritation or wound care (for superficial cuts to left anterior wrist.). 07/13/19  Yes Connye Burkitt, NP  pantoprazole (PROTONIX) 40 MG tablet Take 1 tablet (40 mg total) by mouth daily. 06/16/19  Yes Lajean Saver, MD  risperiDONE (RISPERDAL) 2 MG tablet Take 1 tablet (2 mg total) by mouth in the morning. 07/06/19  Yes Connye Burkitt, NP  risperiDONE (RISPERDAL) 3 MG tablet Take 1 tablet (3 mg total) by mouth at bedtime. 07/06/19  Yes Connye Burkitt, NP  sertraline (ZOLOFT) 50 MG tablet Take 3 tablets (150 mg total) by mouth daily. 07/14/19  Yes Connye Burkitt, NP  sucralfate (CARAFATE) 1 g tablet Take 1 tablet (1 g total) by mouth 4 (four) times daily -  with meals and at bedtime. 07/13/19  Yes Connye Burkitt, NP  traZODone (DESYREL) 100 MG tablet Take 2 tablets (200 mg total) by mouth at bedtime as needed for sleep. Patient taking differently: Take 200 mg  by mouth at bedtime.  07/13/19  Yes Connye Burkitt, NP    Allergies    Bee venom, Coconut flavor, Geodon [ziprasidone hcl], Haloperidol and related, Lithium, Oxycodone, Quetiapine, Seroquel [quetiapine fumarate], Shellfish allergy, Phenergan [promethazine hcl], Prilosec [omeprazole], Sulfa antibiotics, and Tegretol [carbamazepine]  Review of Systems   Review of Systems 10 systems reviewed and negative except as per HPI Physical Exam Updated Vital Signs BP 116/73 (BP Location: Right Arm)   Pulse 71  Resp 16   Ht 5\' 5"  (1.651 m)   Wt 95 kg   LMP 06/18/2019 Comment: neg preg test  SpO2 99%   BMI 34.85 kg/m   Physical Exam Constitutional:      Appearance: She is well-developed.     Comments: Alert and clinically well in appearance.  No distress.  Resting comfortably.  HENT:     Head: Normocephalic and atraumatic.  Eyes:     Extraocular Movements: Extraocular movements intact.     Conjunctiva/sclera: Conjunctivae normal.  Cardiovascular:     Rate and Rhythm: Normal rate and regular rhythm.     Heart sounds: Normal heart sounds.  Pulmonary:     Effort: Pulmonary effort is normal.     Breath sounds: Normal breath sounds.  Chest:     Chest wall: No tenderness.  Abdominal:     General: Bowel sounds are normal. There is no distension.     Palpations: Abdomen is soft.     Tenderness: There is no abdominal tenderness.  Musculoskeletal:        General: No swelling or tenderness. Normal range of motion.     Cervical back: Neck supple.     Right lower leg: No edema.     Left lower leg: No edema.  Skin:    General: Skin is warm and dry.  Neurological:     General: No focal deficit present.     Mental Status: She is alert and oriented to person, place, and time.     GCS: GCS eye subscore is 4. GCS verbal subscore is 5. GCS motor subscore is 6.     Coordination: Coordination normal.  Psychiatric:        Mood and Affect: Mood normal.     ED Results / Procedures / Treatments     Labs (all labs ordered are listed, but only abnormal results are displayed) Labs Reviewed  SALICYLATE LEVEL - Abnormal; Notable for the following components:      Result Value   Salicylate Lvl <0.2 (*)    All other components within normal limits  ACETAMINOPHEN LEVEL - Abnormal; Notable for the following components:   Acetaminophen (Tylenol), Serum <10 (*)    All other components within normal limits  BASIC METABOLIC PANEL  CBC  ETHANOL  RAPID URINE DRUG SCREEN, HOSP PERFORMED  I-STAT BETA HCG BLOOD, ED ([facility], [facility], [facility] ONLY)  I-STAT BETA HCG BLOOD, ED ([facility], [facility], [facility] ONLY)  TROPONIN I (HIGH SENSITIVITY)  TROPONIN I (HIGH SENSITIVITY)    EKG EKG Interpretation  Date/Time:  Monday July 27 2019 01:51:47 EDT Ventricular Rate:  111 PR Interval:  118 QRS Duration: 84 QT Interval:  398 QTC Calculation: 541 R Axis:   84 Text Interpretation: Sinus tachycardia Prolonged QT Abnormal ECG No significant change was found Confirmed by Ezequiel Essex [phone]) on 07/27/2019 7:24:12 AM   Radiology DG Chest 2 View  Result Date: 07/27/2019 CLINICAL DATA:  Chest pain EXAM: CHEST - 2 VIEW COMPARISON:  July 16, 2019 FINDINGS: The heart size and mediastinal contours are within normal limits. Both lungs are clear. The visualized skeletal structures are unremarkable. IMPRESSION: No active cardiopulmonary disease. Electronically Signed   By: Prudencio Pair M.D.   On: 07/27/2019 03:36   DG Abdomen Acute W/Chest  Result Date: 07/27/2019 CLINICAL DATA:  The patient swallowed a foreign body. EXAM: DG ABDOMEN ACUTE W/ 1V CHEST COMPARISON:  Chest and two views abdomen 03/29/2014. FINDINGS: Metallic foreign body measuring approximately 2.5 cm in length is  seen in the stomach. Lungs are clear. Heart size is normal. Bowel gas pattern is normal. No acute bony abnormality. IMPRESSION: Metallic foreign body is in the stomach. The exam is otherwise negative. Electronically Signed   By: Inge Rise M.D.   On: 07/27/2019  10:15    Procedures Procedures (including critical care time)  Medications Ordered in ED Medications  sodium chloride flush (NS) 0.9 % injection 3 mL (3 mLs Intravenous Not Given 07/27/19 0930)  busPIRone (BUSPAR) tablet 10 mg (has no administration in time range)  pantoprazole (PROTONIX) EC tablet 40 mg (has no administration in time range)  risperiDONE (RISPERDAL) tablet 2 mg (2 mg Oral Given 07/27/19 0934)  risperiDONE (RISPERDAL) tablet 3 mg (has no administration in time range)  sertraline (ZOLOFT) tablet 150 mg (has no administration in time range)  sucralfate (CARAFATE) tablet 1 g (1 g Oral Given 07/27/19 0934)  traZODone (DESYREL) tablet 200 mg (has no administration in time range)  albuterol (VENTOLIN HFA) 108 (90 Base) MCG/ACT inhaler 1-2 puff (2 puffs Inhalation Given 07/27/19 7616)    ED Course  I have reviewed the triage vital signs and the nursing notes.  Pertinent labs & imaging results that were available during my care of the patient were reviewed by me and considered in my medical decision making (see chart for details).  Clinical Course as of Jul 26 1021  Mon Jul 27, 2019  0937 Patient was medically cleared and ready for transfer back to behavioral health Hospital.  She then reported to the nurse that she swallowed the metal portion out of her facemask.  He has taken her pills and swallowed without difficulty since her report of doing this.  Will obtain a chest and abdomen x-ray to evaluate for possible ingested foreign body.  Patient is stable with no objective signs of distress.   [MP]    Clinical Course User Index [MP] Charlesetta Shanks, MD   MDM Rules/Calculators/A&P                          Patient has been evaluated behavioral health for suicidal ideation.  Patient reports ongoing social stressors.  Patient was transported to the emergency department yesterday evening for an episode of chest pain.  Pain has completely resolved.  Diagnostic evaluation is within  normal limits.  At this time very low suspicion for emergent cardiopulmonary etiology.  Higher suspicion for anxiety\GI\musculoskeletal chest pain.  Patient is medically cleared for return to behavioral health for ongoing treatment of suicide ideation with concurrent psychiatric diagnoses.  During encounter patient disassembled her mask and swallowed the small metal support structure of the nose piece.  X-rays obtained.  This is in the stomach.  Patient has been swallowing and drinking without difficulty since ingesting this.  She made [name] aware of this at the time of plan to transfer back to behavioral health.  This is stable ingested foreign body.  Will need repeat x-ray in 48 hours to make sure it is progressing.  This should pass with normal bowel movement.  Can eat and drink as usual. Final Clinical Impression(s) / ED Diagnoses Final diagnoses:  Suicidal ideation  Atypical chest pain  Foreign body in stomach, initial encounter    Rx / DC Orders ED Discharge Orders    None       Charlesetta Shanks, MD 07/27/19 0737    Charlesetta Shanks, MD 07/27/19 1024

## 2019-07-28 ENCOUNTER — Ambulatory Visit
Admission: RE | Admit: 2019-07-28 | Discharge: 2019-07-28 | Disposition: A | Discharge status: Home / Self Care | Payer: PPO | Source: Ambulatory Visit | Attending: Family Medicine | Admitting: Family Medicine

## 2019-07-28 DIAGNOSIS — R928 Other abnormal and inconclusive findings on diagnostic imaging of breast: Secondary | ICD-10-CM | POA: Diagnosis not present

## 2019-07-28 DIAGNOSIS — N644 Mastodynia: Secondary | ICD-10-CM

## 2019-07-28 DIAGNOSIS — F250 Schizoaffective disorder, bipolar type: Secondary | ICD-10-CM | POA: Diagnosis not present

## 2019-07-31 ENCOUNTER — Telehealth: Payer: Self-pay | Admitting: Obstetrics & Gynecology

## 2019-07-31 ENCOUNTER — Encounter (HOSPITAL_COMMUNITY): Payer: Self-pay | Admitting: Emergency Medicine

## 2019-07-31 ENCOUNTER — Emergency Department (HOSPITAL_COMMUNITY): Payer: PPO

## 2019-07-31 ENCOUNTER — Encounter (HOSPITAL_COMMUNITY): Payer: Self-pay

## 2019-07-31 ENCOUNTER — Emergency Department (HOSPITAL_COMMUNITY)
Admission: EM | Admit: 2019-07-31 | Discharge: 2019-07-31 | Disposition: A | Discharge status: Home / Self Care | Payer: PPO | Source: Home / Self Care | Attending: Emergency Medicine | Admitting: Emergency Medicine

## 2019-07-31 ENCOUNTER — Other Ambulatory Visit: Payer: Self-pay

## 2019-07-31 ENCOUNTER — Emergency Department (HOSPITAL_COMMUNITY)
Admission: EM | Admit: 2019-07-31 | Discharge: 2019-07-31 | Disposition: A | Discharge status: Home / Self Care | Payer: PPO | Attending: Emergency Medicine | Admitting: Emergency Medicine

## 2019-07-31 ENCOUNTER — Ambulatory Visit: Payer: PPO | Admitting: Obstetrics & Gynecology

## 2019-07-31 DIAGNOSIS — T7411XA Adult physical abuse, confirmed, initial encounter: Secondary | ICD-10-CM | POA: Diagnosis not present

## 2019-07-31 DIAGNOSIS — N898 Other specified noninflammatory disorders of vagina: Secondary | ICD-10-CM | POA: Insufficient documentation

## 2019-07-31 DIAGNOSIS — R1084 Generalized abdominal pain: Secondary | ICD-10-CM | POA: Diagnosis not present

## 2019-07-31 DIAGNOSIS — J45909 Unspecified asthma, uncomplicated: Secondary | ICD-10-CM | POA: Insufficient documentation

## 2019-07-31 DIAGNOSIS — R102 Pelvic and perineal pain: Secondary | ICD-10-CM | POA: Diagnosis not present

## 2019-07-31 DIAGNOSIS — T189XXA Foreign body of alimentary tract, part unspecified, initial encounter: Secondary | ICD-10-CM | POA: Diagnosis not present

## 2019-07-31 DIAGNOSIS — R52 Pain, unspecified: Secondary | ICD-10-CM | POA: Diagnosis not present

## 2019-07-31 DIAGNOSIS — F840 Autistic disorder: Secondary | ICD-10-CM | POA: Insufficient documentation

## 2019-07-31 DIAGNOSIS — F250 Schizoaffective disorder, bipolar type: Secondary | ICD-10-CM | POA: Insufficient documentation

## 2019-07-31 DIAGNOSIS — R109 Unspecified abdominal pain: Secondary | ICD-10-CM | POA: Diagnosis not present

## 2019-07-31 DIAGNOSIS — Z79899 Other long term (current) drug therapy: Secondary | ICD-10-CM | POA: Insufficient documentation

## 2019-07-31 DIAGNOSIS — Z87891 Personal history of nicotine dependence: Secondary | ICD-10-CM | POA: Insufficient documentation

## 2019-07-31 DIAGNOSIS — T7421XA Adult sexual abuse, confirmed, initial encounter: Secondary | ICD-10-CM | POA: Diagnosis not present

## 2019-07-31 LAB — URINALYSIS, ROUTINE W REFLEX MICROSCOPIC
Bilirubin Urine: NEGATIVE
Bilirubin Urine: NEGATIVE
Glucose, UA: NEGATIVE mg/dL
Glucose, UA: NEGATIVE mg/dL
Hgb urine dipstick: NEGATIVE
Hgb urine dipstick: NEGATIVE
Ketones, ur: NEGATIVE mg/dL
Ketones, ur: NEGATIVE mg/dL
Leukocytes,Ua: NEGATIVE
Leukocytes,Ua: NEGATIVE
Nitrite: NEGATIVE
Nitrite: NEGATIVE
Protein, ur: NEGATIVE mg/dL
Protein, ur: NEGATIVE mg/dL
Specific Gravity, Urine: 1.021 (ref 1.005–1.030)
Specific Gravity, Urine: 1.016 (ref 1.005–1.030)
pH: 7 (ref 5.0–8.0)
pH: 5 (ref 5.0–8.0)

## 2019-07-31 LAB — COMPREHENSIVE METABOLIC PANEL
ALT: 16 U/L (ref 0–44)
AST: 19 U/L (ref 15–41)
Albumin: 4.2 g/dL (ref 3.5–5.0)
Alkaline Phosphatase: 64 U/L (ref 38–126)
Anion gap: 11 (ref 5–15)
BUN: 6 mg/dL (ref 6–20)
CO2: 24 mmol/L (ref 22–32)
Calcium: 9.3 mg/dL (ref 8.9–10.3)
Chloride: 104 mmol/L (ref 98–111)
Creatinine, Ser: 0.9 mg/dL (ref 0.44–1.00)
GFR calc Af Amer: >60 mL/min (ref >60)
GFR calc non Af Amer: >60 mL/min (ref >60)
Glucose, Bld: 102 mg/dL — ABNORMAL HIGH (ref 70–99)
Potassium: 4 mmol/L (ref 3.5–5.1)
Sodium: 139 mmol/L (ref 135–145)
Total Bilirubin: 0.5 mg/dL (ref 0.3–1.2)
Total Protein: 7 g/dL (ref 6.5–8.1)

## 2019-07-31 LAB — I-STAT BETA HCG BLOOD, ED (MC, WL, AP ONLY): I-stat hCG, quantitative: <5 m[IU]/mL (ref <5)

## 2019-07-31 LAB — CBC
HCT: 41.3 % (ref 36.0–46.0)
Hemoglobin: 13.2 g/dL (ref 12.0–15.0)
MCH: 26.6 pg (ref 26.0–34.0)
MCHC: 32 g/dL (ref 30.0–36.0)
MCV: 83.3 fL (ref 80.0–100.0)
Platelets: 271 10*3/uL (ref 150–400)
RBC: 4.96 MIL/uL (ref 3.87–5.11)
RDW: 12.4 % (ref 11.5–15.5)
WBC: 8.8 10*3/uL (ref 4.0–10.5)
nRBC: 0 % (ref 0.0–0.2)

## 2019-07-31 LAB — WET PREP, GENITAL
Clue Cells Wet Prep HPF POC: NONE SEEN
Sperm: NONE SEEN
Trich, Wet Prep: NONE SEEN
Yeast Wet Prep HPF POC: NONE SEEN

## 2019-07-31 LAB — LIPASE, BLOOD: Lipase: 38 U/L (ref 11–51)

## 2019-07-31 MED ORDER — LIDOCAINE VISCOUS HCL 2 % MT SOLN
15.0000 mL | Freq: Once | OROMUCOSAL | Status: AC
  Administered 2019-07-31: 15 mL via ORAL
  Filled 2019-07-31: qty 15

## 2019-07-31 MED ORDER — ALUM & MAG HYDROXIDE-SIMETH 200-200-20 MG/5ML PO SUSP
30.0000 mL | Freq: Once | ORAL | Status: AC
  Administered 2019-07-31: 30 mL via ORAL
  Filled 2019-07-31: qty 30

## 2019-07-31 MED ORDER — SODIUM CHLORIDE 0.9% FLUSH: 3.0000 mL | Freq: Once | INTRAVENOUS | Status: DC

## 2019-07-31 MED ORDER — SUCRALFATE 1 G PO TABS
1.0000 g | ORAL_TABLET | Freq: Once | ORAL | Status: AC
  Administered 2019-07-31: 1 g via ORAL
  Filled 2019-07-31: qty 1

## 2019-07-31 MED ORDER — BUSPIRONE HCL 10 MG PO TABS
10.0000 mg | ORAL_TABLET | Freq: Once | ORAL | Status: AC
  Administered 2019-07-31: 10 mg via ORAL
  Filled 2019-07-31: qty 1
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
## 2019-07-31 NOTE — Discharge Instructions (Signed)
Use Tylenol or ibuprofen as needed for pain. You may use ice to help with pain and swelling. Follow-up with your OB/GYN as needed for further evaluation. Return to the emergency room with any new, worsening, or concerning symptoms.
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
## 2019-07-31 NOTE — ED Notes (Signed)
Discharge instructions discussed with pt. Pt verbalized understanding with no questions at this time. Pt [facility]
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
## 2019-07-31 NOTE — Telephone Encounter (Signed)
Patient called to say she was told to go to the [facility]. She was worked in to be seen today, and now was not going to make her appointment. I informed her I would have to reschedule her. She stated she was in pain, and needed to be seen. I spoke with Jacques Navy, and she asked me to inform the patient that if she was in pain she should go to the hospital for an evaluation.
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
## 2019-08-01 ENCOUNTER — Emergency Department (HOSPITAL_BASED_OUTPATIENT_CLINIC_OR_DEPARTMENT_OTHER)
Admission: EM | Admit: 2019-08-01 | Discharge: 2019-08-01 | Disposition: A | Discharge status: Home / Self Care | Payer: PPO | Attending: Emergency Medicine | Admitting: Emergency Medicine

## 2019-08-01 ENCOUNTER — Other Ambulatory Visit: Payer: Self-pay

## 2019-08-01 ENCOUNTER — Encounter (HOSPITAL_BASED_OUTPATIENT_CLINIC_OR_DEPARTMENT_OTHER): Payer: Self-pay | Admitting: Emergency Medicine

## 2019-08-01 DIAGNOSIS — R630 Anorexia: Secondary | ICD-10-CM | POA: Diagnosis not present

## 2019-08-01 DIAGNOSIS — R5383 Other fatigue: Secondary | ICD-10-CM | POA: Diagnosis not present

## 2019-08-01 DIAGNOSIS — Z79899 Other long term (current) drug therapy: Secondary | ICD-10-CM | POA: Insufficient documentation

## 2019-08-01 DIAGNOSIS — R5381 Other malaise: Secondary | ICD-10-CM | POA: Diagnosis not present

## 2019-08-01 DIAGNOSIS — J45909 Unspecified asthma, uncomplicated: Secondary | ICD-10-CM | POA: Diagnosis not present

## 2019-08-01 DIAGNOSIS — Z87891 Personal history of nicotine dependence: Secondary | ICD-10-CM | POA: Insufficient documentation

## 2019-08-01 DIAGNOSIS — R531 Weakness: Secondary | ICD-10-CM | POA: Diagnosis not present
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
## 2019-08-01 NOTE — ED Notes (Signed)
Pt is in the waiting room, states that " I am waiting on a taxi". Pt states " I no longer want to wait for the dr." The patient states that she is fine. PA aware

## 2019-08-01 NOTE — ED Notes (Signed)
Went in to reassess the patient and patient no longer in the room. Waiting for patient to return

## 2019-08-01 NOTE — Discharge Instructions (Signed)
As discussed, all of your labs from yesterday were reassuring.  No signs of infection.  I suspect her symptoms could be related to depression.  Continue to take your depression medication as prescribed.  Return to the ER for new or worsening symptoms.

## 2019-08-01 NOTE — ED Triage Notes (Addendum)
BIB ems c/o gen weakness with nausea x 4 days. Was seen at [facility] yesterday for a vaginal problem at at [facility] but states she did not mention this problem.

## 2019-08-02 ENCOUNTER — Encounter (HOSPITAL_COMMUNITY): Payer: Self-pay

## 2019-08-02 ENCOUNTER — Emergency Department (HOSPITAL_COMMUNITY)
Admission: EM | Admit: 2019-08-02 | Discharge: 2019-08-03 | Disposition: A | Discharge status: Home / Self Care | Payer: PPO | Attending: Emergency Medicine | Admitting: Emergency Medicine

## 2019-08-02 DIAGNOSIS — T50902A Poisoning by unspecified drugs, medicaments and biological substances, intentional self-harm, initial encounter: Secondary | ICD-10-CM

## 2019-08-02 DIAGNOSIS — X80XXXA Intentional self-harm by jumping from a high place, initial encounter: Secondary | ICD-10-CM | POA: Diagnosis not present

## 2019-08-02 DIAGNOSIS — T1491XA Suicide attempt, initial encounter: Secondary | ICD-10-CM | POA: Insufficient documentation

## 2019-08-02 DIAGNOSIS — Z79899 Other long term (current) drug therapy: Secondary | ICD-10-CM | POA: Diagnosis not present

## 2019-08-02 DIAGNOSIS — Z20822 Contact with and (suspected) exposure to covid-19: Secondary | ICD-10-CM | POA: Insufficient documentation

## 2019-08-02 DIAGNOSIS — Y939 Activity, unspecified: Secondary | ICD-10-CM | POA: Insufficient documentation

## 2019-08-02 DIAGNOSIS — T50992A Poisoning by other drugs, medicaments and biological substances, intentional self-harm, initial encounter: Secondary | ICD-10-CM | POA: Diagnosis not present

## 2019-08-02 DIAGNOSIS — Z87891 Personal history of nicotine dependence: Secondary | ICD-10-CM | POA: Diagnosis not present

## 2019-08-02 DIAGNOSIS — F4325 Adjustment disorder with mixed disturbance of emotions and conduct: Secondary | ICD-10-CM | POA: Diagnosis present

## 2019-08-02 DIAGNOSIS — F250 Schizoaffective disorder, bipolar type: Secondary | ICD-10-CM | POA: Insufficient documentation

## 2019-08-02 DIAGNOSIS — Y929 Unspecified place or not applicable: Secondary | ICD-10-CM | POA: Insufficient documentation

## 2019-08-02 DIAGNOSIS — F29 Unspecified psychosis not due to a substance or known physiological condition: Secondary | ICD-10-CM | POA: Diagnosis not present

## 2019-08-02 DIAGNOSIS — T43592A Poisoning by other antipsychotics and neuroleptics, intentional self-harm, initial encounter: Secondary | ICD-10-CM | POA: Diagnosis not present

## 2019-08-02 DIAGNOSIS — J45909 Unspecified asthma, uncomplicated: Secondary | ICD-10-CM | POA: Diagnosis not present

## 2019-08-02 DIAGNOSIS — Y999 Unspecified external cause status: Secondary | ICD-10-CM | POA: Insufficient documentation

## 2019-08-02 DIAGNOSIS — R9431 Abnormal electrocardiogram [ECG] [EKG]: Secondary | ICD-10-CM | POA: Diagnosis not present

## 2019-08-02 LAB — COMPREHENSIVE METABOLIC PANEL
ALT: 14 U/L (ref 0–44)
AST: 19 U/L (ref 15–41)
Albumin: 4.3 g/dL (ref 3.5–5.0)
Alkaline Phosphatase: 62 U/L (ref 38–126)
Anion gap: 12 (ref 5–15)
BUN: 7 mg/dL (ref 6–20)
CO2: 22 mmol/L (ref 22–32)
Calcium: 8.9 mg/dL (ref 8.9–10.3)
Chloride: 105 mmol/L (ref 98–111)
Creatinine, Ser: 0.66 mg/dL (ref 0.44–1.00)
GFR calc Af Amer: >60 mL/min (ref >60)
GFR calc non Af Amer: >60 mL/min (ref >60)
Glucose, Bld: 95 mg/dL (ref 70–99)
Potassium: 3.7 mmol/L (ref 3.5–5.1)
Sodium: 139 mmol/L (ref 135–145)
Total Bilirubin: 0.3 mg/dL (ref 0.3–1.2)
Total Protein: 7.2 g/dL (ref 6.5–8.1)

## 2019-08-02 LAB — CBC
HCT: 38.1 % (ref 36.0–46.0)
Hemoglobin: 12.4 g/dL (ref 12.0–15.0)
MCH: 27.3 pg (ref 26.0–34.0)
MCHC: 32.5 g/dL (ref 30.0–36.0)
MCV: 83.7 fL (ref 80.0–100.0)
Platelets: 220 10*3/uL (ref 150–400)
RBC: 4.55 MIL/uL (ref 3.87–5.11)
RDW: 12.7 % (ref 11.5–15.5)
WBC: 10.9 10*3/uL — ABNORMAL HIGH (ref 4.0–10.5)
nRBC: 0 % (ref 0.0–0.2)

## 2019-08-02 LAB — RAPID URINE DRUG SCREEN, HOSP PERFORMED
Amphetamines: NOT DETECTED
Barbiturates: NOT DETECTED
Benzodiazepines: NOT DETECTED
Cocaine: NOT DETECTED
Opiates: NOT DETECTED
Tetrahydrocannabinol: NOT DETECTED

## 2019-08-02 LAB — SARS CORONAVIRUS 2 BY RT PCR (HOSPITAL ORDER, PERFORMED IN ~~LOC~~ HOSPITAL LAB): SARS Coronavirus 2: NEGATIVE

## 2019-08-02 LAB — MAGNESIUM: Magnesium: 1.9 mg/dL (ref 1.7–2.4)

## 2019-08-02 LAB — ACETAMINOPHEN LEVEL
Acetaminophen (Tylenol), Serum: <10 ug/mL — ABNORMAL LOW (ref 10–30)
Acetaminophen (Tylenol), Serum: <10 ug/mL — ABNORMAL LOW (ref 10–30)

## 2019-08-02 LAB — I-STAT BETA HCG BLOOD, ED (MC, WL, AP ONLY): I-stat hCG, quantitative: <5 m[IU]/mL (ref <5)

## 2019-08-02 LAB — SALICYLATE LEVEL: Salicylate Lvl: <7 mg/dL — ABNORMAL LOW (ref 7.0–30.0)

## 2019-08-02 LAB — ETHANOL: Alcohol, Ethyl (B): <10 mg/dL (ref <10)

## 2019-08-02 MED ORDER — BUSPIRONE HCL 10 MG PO TABS
10.0000 mg | ORAL_TABLET | Freq: Three times a day (TID) | ORAL | Status: DC
  Administered 2019-08-02 – 2019-08-03 (×4): 10 mg via ORAL
  Filled 2019-08-02 (×4): qty 1

## 2019-08-02 MED ORDER — MAGNESIUM SULFATE IN D5W 1-5 GM/100ML-% IV SOLN
1.0000 g | Freq: Once | INTRAVENOUS | Status: AC
  Administered 2019-08-02: 1 g via INTRAVENOUS
  Filled 2019-08-02: qty 100

## 2019-08-02 MED ORDER — PANTOPRAZOLE SODIUM 40 MG PO TBEC
40.0000 mg | DELAYED_RELEASE_TABLET | Freq: Every day | ORAL | Status: DC
  Administered 2019-08-02 – 2019-08-03 (×2): 40 mg via ORAL
  Filled 2019-08-02 (×2): qty 1

## 2019-08-02 MED ORDER — VITAMIN E 180 MG (400 UNIT) PO CAPS
800.0000 [IU] | ORAL_CAPSULE | Freq: Every day | ORAL | Status: DC
  Administered 2019-08-02 – 2019-08-03 (×2): 800 [IU] via ORAL
  Filled 2019-08-02 (×2): qty 2

## 2019-08-02 MED ORDER — RISPERIDONE 2 MG PO TABS
2.0000 mg | ORAL_TABLET | Freq: Every morning | ORAL | Status: DC
  Administered 2019-08-02 – 2019-08-03 (×2): 2 mg via ORAL
  Filled 2019-08-02: qty 1

## 2019-08-02 MED ORDER — ALBUTEROL SULFATE HFA 108 (90 BASE) MCG/ACT IN AERS: 1.0000 | INHALATION_SPRAY | Freq: Four times a day (QID) | RESPIRATORY_TRACT | Status: DC | PRN

## 2019-08-02 MED ORDER — MOMETASONE FURO-FORMOTEROL FUM 200-5 MCG/ACT IN AERO
2.0000 | INHALATION_SPRAY | Freq: Two times a day (BID) | RESPIRATORY_TRACT | Status: DC
  Administered 2019-08-02 – 2019-08-03 (×2): 2 via RESPIRATORY_TRACT
  Filled 2019-08-02 (×3): qty 8.8

## 2019-08-02 MED ORDER — SUCRALFATE 1 G PO TABS
1.0000 g | ORAL_TABLET | Freq: Three times a day (TID) | ORAL | Status: DC
  Administered 2019-08-02 – 2019-08-03 (×5): 1 g via ORAL
  Filled 2019-08-02 (×5): qty 1

## 2019-08-02 MED ORDER — ADULT MULTIVITAMIN W/MINERALS CH
1.0000 | ORAL_TABLET | Freq: Every day | ORAL | Status: DC
  Administered 2019-08-02 – 2019-08-03 (×2): 1 via ORAL
  Filled 2019-08-02 (×2): qty 1

## 2019-08-02 MED ORDER — POTASSIUM CHLORIDE CRYS ER 20 MEQ PO TBCR
40.0000 meq | EXTENDED_RELEASE_TABLET | Freq: Once | ORAL | Status: AC
  Administered 2019-08-02: 40 meq via ORAL
  Filled 2019-08-02: qty 2

## 2019-08-02 MED ORDER — ACETAMINOPHEN 325 MG PO TABS: 650.0000 mg | ORAL_TABLET | ORAL | Status: DC | PRN

## 2019-08-02 MED ORDER — MAGNESIUM SULFATE 50 % IJ SOLN: 1.0000 g | Freq: Once | INTRAMUSCULAR | Status: DC

## 2019-08-02 MED ORDER — SERTRALINE HCL 50 MG PO TABS
150.0000 mg | ORAL_TABLET | Freq: Every day | ORAL | Status: DC
  Administered 2019-08-02 – 2019-08-03 (×2): 150 mg via ORAL
  Filled 2019-08-02 (×2): qty 3

## 2019-08-02 MED ORDER — CHARCOAL ACTIVATED PO LIQD
50.0000 g | Freq: Once | ORAL | Status: AC
  Administered 2019-08-02: 50 g via ORAL
  Filled 2019-08-02: qty 240

## 2019-08-02 MED ORDER — TRAZODONE HCL 100 MG PO TABS
200.0000 mg | ORAL_TABLET | Freq: Every evening | ORAL | Status: DC | PRN
  Administered 2019-08-03: 200 mg via ORAL
  Filled 2019-08-02: qty 2

## 2019-08-02 MED ORDER — RISPERIDONE 2 MG PO TABS
3.0000 mg | ORAL_TABLET | Freq: Every day | ORAL | Status: DC
  Administered 2019-08-02: 3 mg via ORAL
  Filled 2019-08-02 (×2): qty 1

## 2019-08-02 MED ORDER — ONDANSETRON HCL 4 MG PO TABS: 4.0000 mg | ORAL_TABLET | Freq: Three times a day (TID) | ORAL | Status: DC | PRN

## 2019-08-02 NOTE — ED Triage Notes (Signed)
Pt BIB [facility] from home. She reports that she took about 100 25mg  tabs of Hydroxyzine. States that she took it either 1 hour or 5 minutes prior to transport (1a). VSS. Ambulatory.

## 2019-08-02 NOTE — ED Notes (Signed)
Patty at [facility] recommends: watch for hypotension, qtc prolongation, seizures. Obs for 6-8 hours on cardiac monitor. EKG now and prior to clearing. If qtc is over 500 optimize K+ and mag. Fluids and norepi if needed for hypotension. Benzos for seizures. Add tylenol level. Can offer charcoal if patient is willing.

## 2019-08-02 NOTE — ED Notes (Signed)
Gave pt her phone privileges. Pt called her boyfriend/finace and she is now happy.  She states that she feels fine and would like to go home.  She states that she was suicidal as she thought he had left her but they are alright and so she feels fine and wants to go home.

## 2019-08-02 NOTE — ED Provider Notes (Signed)
[place] DEPT Provider Note   CSN: [id number] Arrival date & time: 08/02/19  0139     History Chief Complaint  Patient presents with  . Ingestion    [name] is a 29 y.o. female with a history of borderline personality disorder, schizoaffective disorder, PTSD, and seizures who presents to the emergency department via EMS status post suicide attempt by ingestion at 1 AM this morning.  Patient reports that she took 100 tablets of her 25 mg hydroxyzine at 1 AM as a suicide attempt.  She states she has been depressed which is what has led to this attempt.  She currently feels tired and mildly nauseous.  No alleviating or aggravating factors.  Per EMS patient had multiple bottles of hydroxyzine present in her home with some tablets on the table.  Her most recent prescription was from January of this year and was for 90 tablets, however there were some other bottles they think may have been hydroxyzine.  She has been stable throughout transport.  No intervention in route.  Patient denies homicidal ideation, hallucination, vomiting, chest pain, dyspnea, syncope, seizure, or abdominal pain.  She has urinated since ingestion.  HPI     Past Medical History:  Diagnosis Date  . Acid reflux   . Anxiety   . Asthma    last attack 03/13/15 or 03/14/15  . Autism   . 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 Chronic constipation   . Depression   . Drug-seeking behavior   . Essential tremor   . Headache   . Overdose of acetaminophen 07/2017   and other meds  . Personality disorder ([place])   . Schizo-affective psychosis ([place])   . Schizoaffective disorder, bipolar type ([place])   . Seizures [facility])    Last seizure December 2017  . Sleep apnea     Patient Active Problem List   Diagnosis Date Noted  . Bipolar I disorder, most recent episode depressed ([place]) 07/03/2019  . MDD (major depressive disorder), recurrent severe, without psychosis ([place]) 07/01/2019  .  Non-suicidal self harm 06/24/2019  . Discord with neighbors, lodgers and landlord 03/16/2019  . Bipolar I disorder, most recent episode (or current) manic ([place]) 03/05/2019  . Desire for pregnancy 07/03/2018  . Drug overdose 07/22/2017  . Overdose 07/22/2017  . Intentional acetaminophen overdose ([place])   . Metabolic acidosis   . DUB (dysfunctional uterine bleeding) 11/22/2016  . Hyperprolactinemia ([place]) 08/20/2016  . Tachycardia 01/13/2016  . 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  . Seizure disorder ([place]) 08/08/2015  . Chronic migraine 07/27/2015  . Asthma 04/15/2015  . Schizoaffective disorder, bipolar type ([place]) 03/10/2014  . Borderline personality disorder ([place]) 03/10/2014  . PTSD (post-traumatic stress disorder) 03/10/2014  . Suicidal ideations   . Borderline personality disorder ([place]) 10/31/2013  . Autism spectrum disorder 06/15/2013    Past Surgical History:  Procedure Laterality Date  . MOUTH SURGERY  2009 or 2010     OB History    Gravida  2   Para  1   Term  1   Preterm      AB  1   Living  1     SAB  1   TAB      Ectopic      Multiple  0   Live Births  1           Family History  Problem Relation Age of Onset  . Mental illness Father   . Asthma  Father   . PDD Brother   . Seizures Brother     Social History   Tobacco Use  . Smoking status: Former Smoker    Packs/day: 0.00  . Smokeless tobacco: Never Used  . Tobacco comment: Smoked for 2  years age [age]  Vaping Use  . Vaping Use: Never used  Substance Use Topics  . Alcohol use: No    Alcohol/week: 1.0 standard drink    Types: 1 Standard drinks or equivalent per week  . Drug use: No    Comment: History of cocaine use at age 9 for 4 months    Home Medications Prior to Admission medications   Medication Sig Start Date End Date Taking? Authorizing Provider  albuterol (PROVENTIL HFA;VENTOLIN HFA) 108 (90 Base) MCG/ACT inhaler Inhale 1-2 puffs into the lungs every 6 (six) hours  as needed for wheezing or shortness of breath.    [provider]  busPIRone (BUSPAR) 10 MG tablet Take 1 tablet (10 mg total) by mouth 3 (three) times daily. 07/13/19   Connye Burkitt, NP  fluticasone-salmeterol (ADVAIR HFA) [phone] MCG/ACT inhaler Inhale 2 puffs into the lungs 2 (two) times daily. 08/03/15   Timmothy Euler, MD  HYDROcodone-acetaminophen (NORCO) 5-325 MG tablet Take 1 tablet by mouth every 4 (four) hours as needed for moderate pain. 08/02/29   Delora Fuel, MD  hydrOXYzine (ATARAX/VISTARIL) 25 MG tablet Take 1 tablet (25 mg total) by mouth every 6 (six) hours as needed for anxiety. 07/06/19   Connye Burkitt, NP  ibuprofen (ADVIL) 200 MG tablet Take 400 mg by mouth 2 (two) times daily as needed for moderate pain.    [provider]  Multiple Vitamin (MULTIVITAMIN) tablet Take 1 tablet by mouth daily. Gummies.    [provider]  neomycin-bacitracin-polymyxin (NEOSPORIN) OINT Apply 1 application topically as needed for irritation or wound care (for superficial cuts to left anterior wrist.). 07/13/19   Connye Burkitt, NP  pantoprazole (PROTONIX) 40 MG tablet Take 1 tablet (40 mg total) by mouth daily. 06/16/19   Lajean Saver, MD  risperiDONE (RISPERDAL) 2 MG tablet Take 1 tablet (2 mg total) by mouth in the morning. 07/06/19   Connye Burkitt, NP  risperiDONE (RISPERDAL) 3 MG tablet Take 1 tablet (3 mg total) by mouth at bedtime. 07/06/19   Connye Burkitt, NP  sertraline (ZOLOFT) 50 MG tablet Take 3 tablets (150 mg total) by mouth daily. 07/14/19   Connye Burkitt, NP  sucralfate (CARAFATE) 1 g tablet Take 1 tablet (1 g total) by mouth 4 (four) times daily -  with meals and at bedtime. 07/13/19   Connye Burkitt, NP  traZODone (DESYREL) 100 MG tablet Take 2 tablets (200 mg total) by mouth at bedtime as needed for sleep. Patient taking differently: Take 200 mg by mouth at bedtime.  07/13/19   Connye Burkitt, NP  vitamin E (VITAMIN E) 180 MG (400 UNITS) capsule Take 800 Units  by mouth daily.    [provider]    Allergies    Bee venom, Coconut flavor, Geodon [ziprasidone hcl], Haloperidol and related, Lithium, Oxycodone, Quetiapine, Seroquel [quetiapine fumarate], Shellfish allergy, Phenergan [promethazine hcl], Prilosec [omeprazole], Sulfa antibiotics, Tegretol [carbamazepine], and Tape  Review of Systems   Review of Systems  Constitutional: Negative for chills and fever.  Respiratory: Negative for shortness of breath.   Cardiovascular: Negative for leg swelling.  Gastrointestinal: Positive for nausea. Negative for abdominal pain, constipation and vomiting.  Neurological: Negative  for seizures and syncope.  Psychiatric/Behavioral: Positive for suicidal ideas. Negative for hallucinations.       Negative for homicidal ideations.  All other systems reviewed and are negative.   Physical Exam Updated Vital Signs BP (!) 111/57   Pulse 76   Temp 98.6 F (37 C)   Resp 16   SpO2 99%   Physical Exam Vitals and nursing note reviewed.  Constitutional:      General: She is not in acute distress.    Appearance: She is well-developed. She is not toxic-appearing.  HENT:     Head: Normocephalic and atraumatic.  Eyes:     General:        Right eye: No discharge.        Left eye: No discharge.     Conjunctiva/sclera: Conjunctivae normal.  Cardiovascular:     Rate and Rhythm: Normal rate and regular rhythm.  Pulmonary:     Effort: Pulmonary effort is normal. No respiratory distress.     Breath sounds: Normal breath sounds. No wheezing, rhonchi or rales.  Abdominal:     General: There is no distension.     Palpations: Abdomen is soft.     Tenderness: There is no abdominal tenderness.  Musculoskeletal:     Cervical back: Neck supple.  Skin:    General: Skin is warm and dry.     Findings: No rash.  Neurological:     Mental Status: She is alert.     Comments: Clear speech.   Psychiatric:        Mood and Affect: Affect is flat.        Thought  Content: Thought content includes suicidal ideation. Thought content does not include homicidal ideation. Thought content includes suicidal plan. Thought content does not include homicidal plan.    ED Results / Procedures / Treatments   Labs (all labs ordered are listed, but only abnormal results are displayed) Labs Reviewed  CBC - Abnormal; Notable for the following components:      Result Value   WBC 10.9 (*)    All other components within normal limits  ACETAMINOPHEN LEVEL - Abnormal; Notable for the following components:   Acetaminophen (Tylenol), Serum <10 (*)    All other components within normal limits  SALICYLATE LEVEL - Abnormal; Notable for the following components:   Salicylate Lvl <3.2 (*)    All other components within normal limits  SARS CORONAVIRUS 2 BY RT PCR (HOSPITAL ORDER, [place] [facility])  COMPREHENSIVE METABOLIC PANEL  ETHANOL  RAPID URINE DRUG SCREEN, HOSP PERFORMED  MAGNESIUM  ACETAMINOPHEN LEVEL  I-STAT BETA HCG BLOOD, ED ([facility], [facility], [facility] ONLY)    EKG EKG Interpretation  Date/Time:  Sunday August 02 2019 02:23:26 EDT Ventricular Rate:  85 PR Interval:    QRS Duration: 80 QT Interval:  463 QTC Calculation: 551 R Axis:   88 Text Interpretation: Sinus rhythm ST elev, probable normal early repol pattern Prolonged QT interval Confirmed by Pryor Curia [phone]) on 08/02/2019 2:32:20 AM  EKG Interpretation  Date/Time:  Sunday August 02 2019 04:23:06 EDT Ventricular Rate:  82 PR Interval:    QRS Duration: 84 QT Interval:  395 QTC Calculation: 462 R Axis:   54 Text Interpretation: Sinus rhythm Confirmed by Pryor Curia [phone]) on 08/02/2019 5:18:39 AM   Radiology No results found.  Procedures Procedures (including critical care time)  Medications Ordered in ED Medications  charcoal activated (NO SORBITOL) (ACTIDOSE-AQUA) suspension 50 g (50 g Oral Given 08/02/19  0313)    ED Course  I have reviewed the triage vital signs and the  nursing notes.  Pertinent labs & imaging results that were available during my care of the patient were reviewed by me and considered in my medical decision making (see chart for details).  [name] was evaluated in Emergency Department on 08/02/2019 for the symptoms described in the history of present illness. He/she was evaluated in the context of the global COVID-19 pandemic, which necessitated consideration that the patient might be at risk for infection with the SARS-CoV-2 virus that causes COVID-19. Institutional protocols and algorithms that pertain to the evaluation of patients at risk for COVID-19 are in a state of rapid change based on information released by regulatory bodies including the CDC and federal and state organizations. These policies and algorithms were followed during the patient's care in the ED.    MDM Rules/Calculators/A&P                         Patient presents to the ED status post suicide attempt by taking 100 tablets of her 25 mg hydroxyzine.  Arrival patient is nontoxic-appearing, resting comfortably, vitals within normal limits.  She has a relatively benign physical exam.  Additional history obtained:  Additional history obtained from EMS. Previous records obtained and reviewed.   RN spoke with Poison Control: Recommends observing patient for 6 to 8 hours status post ingestion on cardiac monitor.  Monitor for hypotension, QTC prolongation, and seizures.  If QTC is over 500 optimize potassium and magnesium.  Fluids and nor epi as needed for hypotension.  Benzos for seizures.  Tylenol level.  Can offer charcoal if patient is willing.  Initial EKG with prolonged QTC at 551, does have history of prior QT prolongation, will avoid QT prolonging medications at this time.  Lab Tests:  I Ordered, reviewed, and interpreted labs, which included:  CBC: Mild leukocytosis at 10.9 felt to be nonspecific.  No anemia. CMP: No significant electrolyte derangement.   Renal function and LFTs within normal limits Magnesium: Within normal limits 1.9 Acetaminophen level, salicylate level, and ethanol level: Within normal limits Pregnancy test: Negative UDS: Negative  Patient tolerated PO charcoal.   Repeat EKG with improved QTc @ 467 prior intervention. Will give 1g IV magnesium & 40 mEQ of potassium to assist with optimization.   05:00: RE-EVAL: Patient sleeping, resting comfortably, vitals remain stable. Checking 4 hour tylenol level now.   06:34: CONSULT: I personally discussed with poison control regarding patient condition, results, and plan of care, per poison control recommendations if her 4-hour Tylenol level which is currently pending is less than 150 patient can be medically cleared at this time as she is sleepy but easily arousable and oriented when awake and tolerating p.o. with reassuring labs and repeat EKG.  Dr. Leonides Schanz to follow up on acetaminophen level  if < 150 patient to be medically cleared for TTS evaluation with disposition per [facility]. Patient is voluntary at this time   Portions of this note were generated with L[employer]. Dictation errors may occur despite best attempts at proofreading.   Final Clinical Impression(s) / ED Diagnoses Final diagnoses:  Intentional drug overdose, initial encounter [facility])    Rx / DC Orders ED Discharge Orders    None       Amaryllis Dyke, PA-C 08/02/19 [place], Delice Bison, DO 08/02/19 [phone]

## 2019-08-02 NOTE — ED Notes (Signed)
Report received from previous shift RN which included information that pt has been cleared by poison control since second tylenol level came back.  Pt VSS  And appears medically stable.  EDP contacted regarding plan and medical clearing of pt.

## 2019-08-02 NOTE — ED Notes (Signed)
Pt has one bag in the cabinet for 9-12 

## 2019-08-02 NOTE — ED Notes (Signed)
Pt walked out into the hall letting me know she is ready to go and feels fine.  Pt also states that she has to leave before 10 to catch the bus.  Pt does not want to wait on TTS.  EDP informed and then explained to pt that due to the nature of her statements prior to speaking to her boyfriend about how she ingested pills in suicidal ideation we will have to allow for a psychological evaluation prior to her leaving and due to the nature of her claims she will become involuntary if she declines to stay.  Pt seemed to understand and got back on stretcher.

## 2019-08-02 NOTE — ED Notes (Signed)
Spoke with pt briefly when I introduced myself to her.  Pt is tearful and emotional when she tells me that "this was the last straw and now I have lost my daughter forever".  She tells me that her daughter is with her grandparents and that her attorney told her the above mentioned information.  Attempted to comfort pt.

## 2019-08-03 ENCOUNTER — Ambulatory Visit (HOSPITAL_COMMUNITY): Payer: PPO | Admitting: Psychiatry

## 2019-08-03 ENCOUNTER — Ambulatory Visit: Payer: PPO | Admitting: Obstetrics and Gynecology

## 2019-08-03 DIAGNOSIS — F4325 Adjustment disorder with mixed disturbance of emotions and conduct: Secondary | ICD-10-CM

## 2019-08-03 HISTORY — DX: Adjustment disorder with mixed disturbance of emotions and conduct: F43.25

## 2019-08-03 LAB — GC/CHLAMYDIA PROBE AMP (~~LOC~~) NOT AT ARMC
Chlamydia: NEGATIVE
Comment: NEGATIVE
Comment: NORMAL
Neisseria Gonorrhea: NEGATIVE

## 2019-08-03 NOTE — Discharge Instructions (Signed)
For your behavioral health needs, you are advised to continue treatment with the [facility] ACT Team:       [facility]      [address], [place]      [place], [place] [address]      [phone]      Crisis number: [phone]

## 2019-08-03 NOTE — ED Notes (Signed)
Patient given meal tray. Patient feeding herself.
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
## 2019-08-03 NOTE — BH Assessment (Signed)
[place] Assessment Progress Note  Per Waylan Boga, DNP, this pt does not require psychiatric hospitalization at this time.  Pt is to be discharged from [facility] with recommendation to continue treatment with the [facility] Team.  This has been included in pt's discharge instructions.  Pt's nurse has been notified.  [name], [place] Triage Specialist [phone]

## 2019-08-03 NOTE — ED Notes (Signed)
Pt comes up to the nurses station several times to ask about discharge paperwork.  Advised her that we are waiting for the TTS note and will let her know when it is posted.

## 2019-08-03 NOTE — ED Notes (Addendum)
Patient belongings are in 9-12 cabinets. Medication and empty pill bottle.

## 2019-08-03 NOTE — ED Provider Notes (Signed)
Emergency Medicine Observation Re-evaluation Note  [name] is a 29 y.o. female, seen on rounds today.  Pt initially presented to the ED for complaints of Ingestion Currently, the patient is resting comfortably in bed..  Physical Exam  BP 114/69 (BP Location: Right Arm)   Pulse 70   Temp 98.4 F (36.9 C) (Oral)   Resp 19   SpO2 99%  Physical Exam  ED Course / MDM  EKG:EKG Interpretation  Date/Time:  Sunday August 02 2019 04:23:06 EDT Ventricular Rate:  82 PR Interval:    QRS Duration: 84 QT Interval:  395 QTC Calculation: 462 R Axis:   54 Text Interpretation: Sinus rhythm Confirmed by Pryor Curia [phone]) on 08/02/2019 5:18:39 AM    I have reviewed the labs performed to date as well as medications administered while in observation.  Recent changes in the last 24 hours include TTS evaluation, plan for admission.. Plan  Current plan is for inpatient bed search, may get reevaluation this morning to see if still requires admission.   Hayden Rasmussen, MD 08/03/19 [phone]
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
## 2019-08-14 ENCOUNTER — Other Ambulatory Visit: Payer: Self-pay

## 2019-08-14 ENCOUNTER — Ambulatory Visit (INDEPENDENT_AMBULATORY_CARE_PROVIDER_SITE_OTHER): Payer: PPO | Admitting: Neurology

## 2019-08-14 DIAGNOSIS — G43709 Chronic migraine without aura, not intractable, without status migrainosus: Secondary | ICD-10-CM | POA: Diagnosis not present

## 2019-08-14 MED ORDER — ONABOTULINUMTOXINA 100 UNITS IJ SOLR: 160.0000 [IU] | Freq: Once | INTRAMUSCULAR | Status: DC

## 2019-08-14 NOTE — Progress Notes (Signed)
[facility]   Procedure Note Botox  Attending: Dr. Metta Clines  Preoperative Diagnosis(es): Chronic migraine  Consent obtained from: Patient Benefits discussed included, but were not limited to decreased muscle tightness, increased joint range of motion, and decreased pain.  Risk discussed included, but were not limited pain and discomfort, bleeding, bruising, excessive weakness, venous thrombosis, muscle atrophy and dysphagia.  Anticipated outcomes of the procedure as well as he risks and benefits of the alternatives to the procedure, and the roles and tasks of the personnel to be involved, were discussed with the patient, and the patient consents to the procedure and agrees to proceed. A copy of the patient medication guide was given to the patient which explains the blackbox warning.  Patients identity and treatment sites confirmed:  Yes  Details of Procedure: Skin was cleaned with alcohol. Prior to injection, the needle plunger was aspirated to make sure the needle was not within a blood vessel.  There was no blood retrieved on aspiration.    Following is a summary of the muscles injected  And the amount of Botulinum toxin used:  Dilution 200 units of Botox was reconstituted with 4 ml of preservative free normal saline. Time of reconstitution: At the time of the office visit (<30 minutes prior to injection)   Injections  155 total units of Botox was injected with a 30 gauge needle.  Injection Sites: L occipitalis: 15 units- 3 sites  R occiptalis: 15 units- 3 sites  L upper trapezius: 15 units- 3 sites R upper trapezius: 15 units- 3 sits          L paraspinal: 10 units- 2 sites R paraspinal: 10 units- 2 sites  Face L frontalis(2 injection sites):10 units   R frontalis(2 injection sites):10 units         L corrugator: 5 units   R corrugator: 5 units           Procerus: 5 units   L temporalis: 20 units R temporalis: 20 units   Agent:  200 units of botulinum Type A  (Onobotulinum Toxin type A) was reconstituted with 4 ml of preservative free normal saline.  Time of reconstitution: At the time of the office visit (<30 minutes prior to injection)     Total injected (Units): 155  Total wasted (Units): 5  Patient tolerated procedure well without complications.   Reinjection is anticipated in 3 months.

## 2019-08-19 DIAGNOSIS — G4733 Obstructive sleep apnea (adult) (pediatric): Secondary | ICD-10-CM | POA: Diagnosis not present

## 2019-08-20 ENCOUNTER — Encounter (HOSPITAL_COMMUNITY): Payer: Self-pay | Admitting: Emergency Medicine

## 2019-08-20 ENCOUNTER — Emergency Department (HOSPITAL_COMMUNITY)
Admission: EM | Admit: 2019-08-20 | Discharge: 2019-08-20 | Disposition: A | Discharge status: Home / Self Care | Payer: PPO | Attending: Emergency Medicine | Admitting: Emergency Medicine

## 2019-08-20 ENCOUNTER — Other Ambulatory Visit: Payer: Self-pay

## 2019-08-20 DIAGNOSIS — R531 Weakness: Secondary | ICD-10-CM | POA: Diagnosis not present

## 2019-08-20 DIAGNOSIS — R509 Fever, unspecified: Secondary | ICD-10-CM | POA: Diagnosis not present

## 2019-08-20 DIAGNOSIS — Z5321 Procedure and treatment not carried out due to patient leaving prior to being seen by health care provider: Secondary | ICD-10-CM | POA: Insufficient documentation

## 2019-08-20 DIAGNOSIS — M791 Myalgia, unspecified site: Secondary | ICD-10-CM | POA: Diagnosis not present

## 2019-08-20 DIAGNOSIS — R519 Headache, unspecified: Secondary | ICD-10-CM | POA: Insufficient documentation

## 2019-08-20 DIAGNOSIS — R110 Nausea: Secondary | ICD-10-CM | POA: Insufficient documentation

## 2019-08-20 DIAGNOSIS — R0689 Other abnormalities of breathing: Secondary | ICD-10-CM | POA: Diagnosis not present

## 2019-08-20 DIAGNOSIS — R0602 Shortness of breath: Secondary | ICD-10-CM | POA: Insufficient documentation

## 2019-08-20 DIAGNOSIS — R42 Dizziness and giddiness: Secondary | ICD-10-CM | POA: Diagnosis not present

## 2019-08-20 DIAGNOSIS — G4489 Other headache syndrome: Secondary | ICD-10-CM | POA: Diagnosis not present

## 2019-08-20 DIAGNOSIS — R000 Tachycardia, unspecified: Secondary | ICD-10-CM | POA: Diagnosis not present

## 2019-08-20 DIAGNOSIS — R52 Pain, unspecified: Secondary | ICD-10-CM | POA: Diagnosis not present

## 2019-08-20 LAB — BASIC METABOLIC PANEL
Anion gap: 9 (ref 5–15)
BUN: 8 mg/dL (ref 6–20)
CO2: 24 mmol/L (ref 22–32)
Calcium: 9.4 mg/dL (ref 8.9–10.3)
Chloride: 105 mmol/L (ref 98–111)
Creatinine, Ser: 0.76 mg/dL (ref 0.44–1.00)
GFR calc Af Amer: >60 mL/min (ref >60)
GFR calc non Af Amer: >60 mL/min (ref >60)
Glucose, Bld: 118 mg/dL — ABNORMAL HIGH (ref 70–99)
Potassium: 3.4 mmol/L — ABNORMAL LOW (ref 3.5–5.1)
Sodium: 138 mmol/L (ref 135–145)

## 2019-08-20 LAB — I-STAT BETA HCG BLOOD, ED (MC, WL, AP ONLY): I-stat hCG, quantitative: <5 m[IU]/mL (ref <5)

## 2019-08-20 LAB — CBC
HCT: 39.9 % (ref 36.0–46.0)
Hemoglobin: 12.4 g/dL (ref 12.0–15.0)
MCH: 25.4 pg — ABNORMAL LOW (ref 26.0–34.0)
MCHC: 31.1 g/dL (ref 30.0–36.0)
MCV: 81.8 fL (ref 80.0–100.0)
Platelets: 207 10*3/uL (ref 150–400)
RBC: 4.88 MIL/uL (ref 3.87–5.11)
RDW: 12.6 % (ref 11.5–15.5)
WBC: 5.6 10*3/uL (ref 4.0–10.5)
nRBC: 0 % (ref 0.0–0.2)

## 2019-08-20 MED ORDER — SODIUM CHLORIDE 0.9% FLUSH: 3.0000 mL | Freq: Once | INTRAVENOUS | Status: DC

## 2019-08-20 NOTE — ED Triage Notes (Signed)
Pt to triage via [facility] from home.  Reports body aches, generalized weakness, SOB, nausea, headache, and fever x 3-4 days.  States she had COVID vaccines.  20g LAC. NS 500cc.   CBG 130.

## 2019-08-24 ENCOUNTER — Telehealth: Payer: Self-pay | Admitting: Neurology

## 2019-08-24 NOTE — Telephone Encounter (Signed)
I advised to patient of Dr.Jaffe's recommendations.

## 2019-08-24 NOTE — Telephone Encounter (Signed)
Headache may be due to a viral illness or whatever may be causing the low-grade fever.  I would recommend seeing her PCP or go to Urgent Care.

## 2019-08-24 NOTE — Telephone Encounter (Signed)
No answer at 1219

## 2019-08-24 NOTE — Telephone Encounter (Signed)
No answer again at 117

## 2019-08-24 NOTE — Telephone Encounter (Signed)
Patient complaint of bilteral eyes hurt and frontal pain in head ongoing since botox, running low grade fever previously, up to 100.8, no sinuses per patient. Please advise.

## 2019-08-24 NOTE — Telephone Encounter (Signed)
Patient called in stating she was having pain in her head and around her eye. She thinks it a side effect of the new way the Botox was put in on her last visit on 08/14/19.

## 2019-08-26 ENCOUNTER — Other Ambulatory Visit: Payer: Self-pay

## 2019-08-31 ENCOUNTER — Other Ambulatory Visit: Payer: Self-pay | Admitting: Obstetrics and Gynecology

## 2019-08-31 DIAGNOSIS — N938 Other specified abnormal uterine and vaginal bleeding: Secondary | ICD-10-CM

## 2019-09-11 DIAGNOSIS — Z6835 Body mass index (BMI) 35.0-35.9, adult: Secondary | ICD-10-CM | POA: Diagnosis not present

## 2019-09-11 DIAGNOSIS — Z87898 Personal history of other specified conditions: Secondary | ICD-10-CM | POA: Diagnosis not present

## 2019-09-11 DIAGNOSIS — E876 Hypokalemia: Secondary | ICD-10-CM | POA: Diagnosis not present

## 2019-09-19 DIAGNOSIS — G4733 Obstructive sleep apnea (adult) (pediatric): Secondary | ICD-10-CM | POA: Diagnosis not present

## 2019-10-07 DIAGNOSIS — G4733 Obstructive sleep apnea (adult) (pediatric): Secondary | ICD-10-CM | POA: Diagnosis not present

## 2019-10-20 DIAGNOSIS — G4733 Obstructive sleep apnea (adult) (pediatric): Secondary | ICD-10-CM | POA: Diagnosis not present

## 2019-11-13 ENCOUNTER — Ambulatory Visit: Payer: PPO | Admitting: Neurology

## 2019-11-13 ENCOUNTER — Other Ambulatory Visit: Payer: Self-pay

## 2019-11-13 ENCOUNTER — Ambulatory Visit (INDEPENDENT_AMBULATORY_CARE_PROVIDER_SITE_OTHER): Payer: PPO | Admitting: Neurology

## 2019-11-13 DIAGNOSIS — G43709 Chronic migraine without aura, not intractable, without status migrainosus: Secondary | ICD-10-CM

## 2019-11-13 MED ORDER — ONABOTULINUMTOXINA 100 UNITS IJ SOLR
155.0000 [IU] | Freq: Once | INTRAMUSCULAR | Status: AC
  Administered 2019-11-13: 155 [IU] via INTRAMUSCULAR

## 2019-11-13 NOTE — Progress Notes (Signed)
[facility]   Procedure Note Botox  Attending: Dr. Metta Clines  Preoperative Diagnosis(es): Chronic migraine  Consent obtained from: The patient. Benefits discussed included, but were not limited to decreased muscle tightness, increased joint range of motion, and decreased pain.  Risk discussed included, but were not limited pain and discomfort, bleeding, bruising, excessive weakness, venous thrombosis, muscle atrophy and dysphagia.  Anticipated outcomes of the procedure as well as he risks and benefits of the alternatives to the procedure, and the roles and tasks of the personnel to be involved, were discussed with the patient, and the patient consents to the procedure and agrees to proceed. A copy of the patient medication guide was given to the patient which explains the blackbox warning.  Patients identity and treatment sites confirmed: yes  Details of Procedure: Skin was cleaned with alcohol. Prior to injection, the needle plunger was aspirated to make sure the needle was not within a blood vessel.  There was no blood retrieved on aspiration.    Following is a summary of the muscles injected  And the amount of Botulinum toxin used:  Dilution 200 units of Botox was reconstituted with 4 ml of preservative free normal saline. Time of reconstitution: At the time of the office visit (<30 minutes prior to injection)   Injections  155 total units of Botox was injected with a 30 gauge needle.  Injection Sites: L occipitalis: 15 units- 3 sites  R occiptalis: 15 units- 3 sites  L upper trapezius: 15 units- 3 sites R upper trapezius: 15 units- 3 sits          L paraspinal: 10 units- 2 sites R paraspinal: 10 units- 2 sites  Face L frontalis(2 injection sites):10 units   R frontalis(2 injection sites):10 units         L corrugator: 5 units   R corrugator: 5 units           Procerus: 5 units   L temporalis: 20 units R temporalis: 20 units   Agent:  200 units of botulinum Type A  (Onobotulinum Toxin type A) was reconstituted with 4 ml of preservative free normal saline.  Time of reconstitution: At the time of the office visit (<30 minutes prior to injection)     Total injected (Units): 155  Total wasted (Units): none wasted  Patient tolerated procedure well without complications.   Reinjection is anticipated in 3 months.

## 2019-11-16 ENCOUNTER — Telehealth: Payer: Self-pay | Admitting: Neurology

## 2019-11-16 NOTE — Telephone Encounter (Signed)
It will go away.

## 2019-11-16 NOTE — Telephone Encounter (Signed)
Pt called after line  11/15/19, c/o bump in the area Botox was given  Right  above the eyebrow10/09/21.    Tried calling pt, No answer. LMOVM

## 2019-11-16 NOTE — Telephone Encounter (Signed)
Pt advised.

## 2019-11-19 DIAGNOSIS — G4733 Obstructive sleep apnea (adult) (pediatric): Secondary | ICD-10-CM | POA: Diagnosis not present

## 2019-11-23 ENCOUNTER — Other Ambulatory Visit: Payer: Self-pay

## 2019-11-23 ENCOUNTER — Encounter (HOSPITAL_COMMUNITY): Payer: Self-pay

## 2019-11-23 ENCOUNTER — Ambulatory Visit (HOSPITAL_COMMUNITY): Payer: Self-pay

## 2019-11-23 ENCOUNTER — Ambulatory Visit (HOSPITAL_COMMUNITY)
Admission: RE | Admit: 2019-11-23 | Discharge: 2019-11-23 | Disposition: A | Discharge status: Home / Self Care | Payer: PPO | Source: Ambulatory Visit | Attending: Urgent Care | Admitting: Urgent Care

## 2019-11-23 VITALS — BP 119/79 | HR 104 | Temp 98.4°F | Resp 18

## 2019-11-23 DIAGNOSIS — S300XXA Contusion of lower back and pelvis, initial encounter: Secondary | ICD-10-CM | POA: Diagnosis not present

## 2019-11-23 DIAGNOSIS — R109 Unspecified abdominal pain: Secondary | ICD-10-CM

## 2019-11-23 MED ORDER — NAPROXEN 375 MG PO TABS
375.0000 mg | ORAL_TABLET | Freq: Two times a day (BID) | ORAL | 0 refills | Status: DC
Start: 2019-11-23 — End: 2020-01-14
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
## 2019-11-29 ENCOUNTER — Encounter (INDEPENDENT_AMBULATORY_CARE_PROVIDER_SITE_OTHER): Payer: Self-pay
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
## 2019-12-17 ENCOUNTER — Telehealth (INDEPENDENT_AMBULATORY_CARE_PROVIDER_SITE_OTHER): Payer: PPO | Admitting: Neurology

## 2019-12-17 ENCOUNTER — Encounter: Payer: Self-pay | Admitting: Neurology

## 2019-12-17 ENCOUNTER — Other Ambulatory Visit: Payer: Self-pay

## 2019-12-17 VITALS — Ht 64.0 in | Wt 212.0 lb

## 2019-12-17 DIAGNOSIS — Z8669 Personal history of other diseases of the nervous system and sense organs: Secondary | ICD-10-CM | POA: Diagnosis not present

## 2019-12-17 DIAGNOSIS — G43709 Chronic migraine without aura, not intractable, without status migrainosus: Secondary | ICD-10-CM

## 2019-12-20 DIAGNOSIS — G4733 Obstructive sleep apnea (adult) (pediatric): Secondary | ICD-10-CM | POA: Diagnosis not present

## 2020-01-03 ENCOUNTER — Emergency Department (HOSPITAL_COMMUNITY)
Admission: EM | Admit: 2020-01-03 | Discharge: 2020-01-04 | Disposition: A | Discharge status: Home / Self Care | Payer: PPO | Attending: Emergency Medicine | Admitting: Emergency Medicine

## 2020-01-03 ENCOUNTER — Other Ambulatory Visit: Payer: Self-pay

## 2020-01-03 DIAGNOSIS — N939 Abnormal uterine and vaginal bleeding, unspecified: Secondary | ICD-10-CM | POA: Insufficient documentation

## 2020-01-03 DIAGNOSIS — Z79899 Other long term (current) drug therapy: Secondary | ICD-10-CM | POA: Insufficient documentation

## 2020-01-03 DIAGNOSIS — F840 Autistic disorder: Secondary | ICD-10-CM | POA: Insufficient documentation

## 2020-01-03 DIAGNOSIS — Z87891 Personal history of nicotine dependence: Secondary | ICD-10-CM | POA: Insufficient documentation

## 2020-01-03 DIAGNOSIS — J45909 Unspecified asthma, uncomplicated: Secondary | ICD-10-CM | POA: Diagnosis not present

## 2020-01-03 DIAGNOSIS — R1084 Generalized abdominal pain: Secondary | ICD-10-CM | POA: Diagnosis not present

## 2020-01-03 DIAGNOSIS — T7621XA Adult sexual abuse, suspected, initial encounter: Secondary | ICD-10-CM | POA: Diagnosis not present

## 2020-01-03 DIAGNOSIS — Z7951 Long term (current) use of inhaled steroids: Secondary | ICD-10-CM | POA: Insufficient documentation

## 2020-01-03 DIAGNOSIS — Z0441 Encounter for examination and observation following alleged adult rape: Secondary | ICD-10-CM | POA: Diagnosis not present

## 2020-01-03 DIAGNOSIS — R102 Pelvic and perineal pain: Secondary | ICD-10-CM | POA: Diagnosis not present

## 2020-01-03 DIAGNOSIS — R58 Hemorrhage, not elsewhere classified: Secondary | ICD-10-CM | POA: Diagnosis not present

## 2020-01-03 DIAGNOSIS — T7421XA Adult sexual abuse, confirmed, initial encounter: Secondary | ICD-10-CM | POA: Insufficient documentation
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
## 2020-01-03 NOTE — ED Notes (Signed)
SANE nurse, Lenna Sciara, called. ETA one hour

## 2020-01-04 ENCOUNTER — Encounter (HOSPITAL_COMMUNITY): Payer: Self-pay | Admitting: *Deleted

## 2020-01-04 ENCOUNTER — Ambulatory Visit (HOSPITAL_COMMUNITY)
Admission: EM | Admit: 2020-01-04 | Discharge: 2020-01-04 | Disposition: A | Discharge status: Home / Self Care | Payer: No Typology Code available for payment source | Attending: Emergency Medicine | Admitting: Emergency Medicine

## 2020-01-04 ENCOUNTER — Emergency Department (HOSPITAL_COMMUNITY)
Admission: EM | Admit: 2020-01-04 | Discharge: 2020-01-04 | Discharge status: Against Medical Advice | Payer: PPO | Source: Home / Self Care | Attending: Emergency Medicine | Admitting: Emergency Medicine

## 2020-01-04 DIAGNOSIS — Z87891 Personal history of nicotine dependence: Secondary | ICD-10-CM | POA: Insufficient documentation

## 2020-01-04 DIAGNOSIS — R52 Pain, unspecified: Secondary | ICD-10-CM | POA: Diagnosis not present

## 2020-01-04 DIAGNOSIS — N939 Abnormal uterine and vaginal bleeding, unspecified: Secondary | ICD-10-CM

## 2020-01-04 DIAGNOSIS — T7421XA Adult sexual abuse, confirmed, initial encounter: Secondary | ICD-10-CM | POA: Diagnosis not present

## 2020-01-04 DIAGNOSIS — Z7951 Long term (current) use of inhaled steroids: Secondary | ICD-10-CM | POA: Insufficient documentation

## 2020-01-04 DIAGNOSIS — J45909 Unspecified asthma, uncomplicated: Secondary | ICD-10-CM | POA: Insufficient documentation

## 2020-01-04 DIAGNOSIS — T7621XA Adult sexual abuse, suspected, initial encounter: Secondary | ICD-10-CM | POA: Diagnosis not present

## 2020-01-04 DIAGNOSIS — Z0441 Encounter for examination and observation following alleged adult rape: Secondary | ICD-10-CM | POA: Insufficient documentation

## 2020-01-04 DIAGNOSIS — R58 Hemorrhage, not elsewhere classified: Secondary | ICD-10-CM | POA: Diagnosis not present

## 2020-01-04 DIAGNOSIS — R457 State of emotional shock and stress, unspecified: Secondary | ICD-10-CM | POA: Diagnosis not present

## 2020-01-04 LAB — I-STAT BETA HCG BLOOD, ED (MC, WL, AP ONLY): I-stat hCG, quantitative: <5 m[IU]/mL (ref <5)
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
## 2020-01-04 NOTE — ED Notes (Signed)
RN notified that pt was no longer visualized in room, RN searched throughout dept for pt. Pt not visualized in department, nor in waiting room.  EDP [place] made aware.
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
## 2020-01-04 NOTE — ED Notes (Signed)
Pelvic set up at bedside

## 2020-01-04 NOTE — ED Notes (Signed)
Lenna Sciara, SANE nurse, present for pt exam

## 2020-01-05 DIAGNOSIS — G4733 Obstructive sleep apnea (adult) (pediatric): Secondary | ICD-10-CM | POA: Diagnosis not present

## 2020-01-08 ENCOUNTER — Emergency Department (HOSPITAL_COMMUNITY)
Admission: EM | Admit: 2020-01-08 | Discharge: 2020-01-08 | Disposition: A | Discharge status: Home / Self Care | Payer: PPO | Attending: Emergency Medicine | Admitting: Emergency Medicine

## 2020-01-08 ENCOUNTER — Ambulatory Visit: Payer: PPO | Admitting: Neurology

## 2020-01-08 ENCOUNTER — Emergency Department (HOSPITAL_COMMUNITY): Payer: PPO

## 2020-01-08 ENCOUNTER — Other Ambulatory Visit: Payer: Self-pay

## 2020-01-08 DIAGNOSIS — R197 Diarrhea, unspecified: Secondary | ICD-10-CM | POA: Diagnosis not present

## 2020-01-08 DIAGNOSIS — Z7951 Long term (current) use of inhaled steroids: Secondary | ICD-10-CM | POA: Diagnosis not present

## 2020-01-08 DIAGNOSIS — R110 Nausea: Secondary | ICD-10-CM

## 2020-01-08 DIAGNOSIS — R1011 Right upper quadrant pain: Secondary | ICD-10-CM | POA: Diagnosis not present

## 2020-01-08 DIAGNOSIS — K219 Gastro-esophageal reflux disease without esophagitis: Secondary | ICD-10-CM | POA: Diagnosis not present

## 2020-01-08 DIAGNOSIS — R1013 Epigastric pain: Secondary | ICD-10-CM | POA: Diagnosis not present

## 2020-01-08 DIAGNOSIS — R112 Nausea with vomiting, unspecified: Secondary | ICD-10-CM | POA: Insufficient documentation

## 2020-01-08 DIAGNOSIS — Z87891 Personal history of nicotine dependence: Secondary | ICD-10-CM | POA: Insufficient documentation

## 2020-01-08 DIAGNOSIS — K760 Fatty (change of) liver, not elsewhere classified: Secondary | ICD-10-CM | POA: Diagnosis not present

## 2020-01-08 DIAGNOSIS — J45909 Unspecified asthma, uncomplicated: Secondary | ICD-10-CM | POA: Insufficient documentation

## 2020-01-08 DIAGNOSIS — R52 Pain, unspecified: Secondary | ICD-10-CM | POA: Diagnosis not present

## 2020-01-08 DIAGNOSIS — R1084 Generalized abdominal pain: Secondary | ICD-10-CM | POA: Diagnosis not present

## 2020-01-08 LAB — COMPREHENSIVE METABOLIC PANEL
ALT: 20 U/L (ref 0–44)
AST: 22 U/L (ref 15–41)
Albumin: 4.6 g/dL (ref 3.5–5.0)
Alkaline Phosphatase: 67 U/L (ref 38–126)
Anion gap: 9 (ref 5–15)
BUN: 7 mg/dL (ref 6–20)
CO2: 20 mmol/L — ABNORMAL LOW (ref 22–32)
Calcium: 9.2 mg/dL (ref 8.9–10.3)
Chloride: 108 mmol/L (ref 98–111)
Creatinine, Ser: 0.72 mg/dL (ref 0.44–1.00)
GFR, Estimated: >60 mL/min (ref >60)
Glucose, Bld: 103 mg/dL — ABNORMAL HIGH (ref 70–99)
Potassium: 4 mmol/L (ref 3.5–5.1)
Sodium: 137 mmol/L (ref 135–145)
Total Bilirubin: 0.3 mg/dL (ref 0.3–1.2)
Total Protein: 8 g/dL (ref 6.5–8.1)

## 2020-01-08 LAB — URINALYSIS, ROUTINE W REFLEX MICROSCOPIC
Bilirubin Urine: NEGATIVE
Glucose, UA: NEGATIVE mg/dL
Ketones, ur: NEGATIVE mg/dL
Leukocytes,Ua: NEGATIVE
Nitrite: NEGATIVE
Protein, ur: NEGATIVE mg/dL
Specific Gravity, Urine: 1.02 (ref 1.005–1.030)
pH: 5 (ref 5.0–8.0)

## 2020-01-08 LAB — CBC
HCT: 41.6 % (ref 36.0–46.0)
Hemoglobin: 13.3 g/dL (ref 12.0–15.0)
MCH: 26.5 pg (ref 26.0–34.0)
MCHC: 32 g/dL (ref 30.0–36.0)
MCV: 83 fL (ref 80.0–100.0)
Platelets: 268 10*3/uL (ref 150–400)
RBC: 5.01 MIL/uL (ref 3.87–5.11)
RDW: 13.8 % (ref 11.5–15.5)
WBC: 7 10*3/uL (ref 4.0–10.5)
nRBC: 0 % (ref 0.0–0.2)

## 2020-01-08 LAB — HCG, SERUM, QUALITATIVE: Preg, Serum: NEGATIVE

## 2020-01-08 LAB — CBG MONITORING, ED: Glucose-Capillary: 97 mg/dL (ref 70–99)

## 2020-01-08 LAB — LIPASE, BLOOD: Lipase: 42 U/L (ref 11–51)

## 2020-01-08 MED ORDER — ONDANSETRON HCL 4 MG/2ML IJ SOLN
4.0000 mg | Freq: Once | INTRAMUSCULAR | Status: AC
  Administered 2020-01-08: 4 mg via INTRAVENOUS
  Filled 2020-01-08: qty 2

## 2020-01-08 MED ORDER — ONDANSETRON HCL 4 MG PO TABS: 4.0000 mg | ORAL_TABLET | Freq: Three times a day (TID) | ORAL | 0 refills | Status: DC | PRN

## 2020-01-08 MED ORDER — SODIUM CHLORIDE 0.9 % IV BOLUS
1000.0000 mL | Freq: Once | INTRAVENOUS | Status: AC
  Administered 2020-01-08: 1000 mL via INTRAVENOUS

## 2020-01-08 MED ORDER — ALUM & MAG HYDROXIDE-SIMETH 200-200-20 MG/5ML PO SUSP
30.0000 mL | Freq: Once | ORAL | Status: AC
  Administered 2020-01-08: 30 mL via ORAL
  Filled 2020-01-08: qty 30

## 2020-01-08 MED ORDER — ONDANSETRON 4 MG PO TBDP
4.0000 mg | ORAL_TABLET | Freq: Once | ORAL | Status: AC | PRN
  Administered 2020-01-08: 4 mg via ORAL
  Filled 2020-01-08: qty 1

## 2020-01-08 MED ORDER — ACETAMINOPHEN 500 MG PO TABS: 1000.0000 mg | ORAL_TABLET | Freq: Once | ORAL | Status: DC

## 2020-01-08 MED ORDER — LIDOCAINE VISCOUS HCL 2 % MT SOLN
15.0000 mL | Freq: Once | OROMUCOSAL | Status: AC
  Administered 2020-01-08: 15 mL via ORAL
  Filled 2020-01-08: qty 15

## 2020-01-08 NOTE — ED Provider Notes (Signed)
[facility] DEPT Provider Note   CSN: [id number] Arrival date & time: 01/08/20  0092     History Chief Complaint  Patient presents with  . Abdominal Pain    [name] is a 29 y.o. female with history of asthma, underlying personality disorder, schizoaffective disorder bipolar type, and DUB.  Patient complains of right upper quadrant and epigastric abdominal pain.  Patient reports that her pain started 1 week prior, is constant, has been getting progressively worse, is described as a sharp stabbing pain, does not radiate, 10/10 on pain scale, is worse with movement and eating food.  Patient endorses nausea, vomiting, diarrhea and chills.  Patient was vomiting 3 times in the last 24 hours with hematochezia or coffee-ground emesis.  Patient reports that her diarrhea has been present for the last 3 days, denies any blood in her stool.  Patient denies any chest pain, shortness of breath, abdominal distention, pelvic pain, dysuria, vaginal bleeding, syncopal episodes or seizures.  Patient denies any history of abdominal surgeries.  She denies any alcohol, tobacco, or illicit drug use. LMP was 11/28, lasted 3 days and was heavier than usual.    HPI     Past Medical History:  Diagnosis Date  . Acid reflux   . Anxiety   . Asthma    last attack 03/13/15 or 03/14/15  . Autism   . 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 Chronic constipation   . Depression   . Drug-seeking behavior   . Essential tremor   . Headache   . Overdose of acetaminophen 07/2017   and other meds  . Personality disorder ([place])   . Schizo-affective psychosis ([place])   . Schizoaffective disorder, bipolar type ([place])   . Seizures [facility])    Last seizure December 2017  . Sleep apnea     Patient Active Problem List   Diagnosis Date Noted  . Adjustment disorder with mixed disturbance of emotions and conduct 08/03/2019  . Overdose 07/22/2017  . Intentional acetaminophen overdose ([place])   .  DUB (dysfunctional uterine bleeding) 11/22/2016  . Hyperprolactinemia ([place]) 08/20/2016  . Tachycardia 01/13/2016  . 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  . Seizure disorder ([place]) 08/08/2015  . Chronic migraine 07/27/2015  . Asthma 04/15/2015  . Schizoaffective disorder, bipolar type ([place]) 03/10/2014  . PTSD (post-traumatic stress disorder) 03/10/2014  . Suicidal ideations   . Borderline personality disorder ([place]) 10/31/2013  . Autism spectrum disorder 06/15/2013    Past Surgical History:  Procedure Laterality Date  . MOUTH SURGERY  2009 or 2010     OB History    Gravida  2   Para  1   Term  1   Preterm      AB  1   Living  1     SAB  1   TAB      Ectopic      Multiple  0   Live Births  1           Family History  Problem Relation Age of Onset  . Mental illness Father   . Asthma Father   . PDD Brother   . Seizures Brother     Social History   Tobacco Use  . Smoking status: Former Smoker    Packs/day: 0.00  . Smokeless tobacco: Never Used  . Tobacco comment: Smoked for 2  years age [age]  Vaping Use  . Vaping Use: Never used  Substance Use Topics  .  Alcohol use: No    Alcohol/week: 1.0 standard drink    Types: 1 Standard drinks or equivalent per week  . Drug use: No    Comment: History of cocaine use at age 67 for 4 months    Home Medications Prior to Admission medications   Medication Sig Start Date End Date Taking? Authorizing Provider  albuterol (PROVENTIL HFA;VENTOLIN HFA) 108 (90 Base) MCG/ACT inhaler Inhale 1-2 puffs into the lungs every 6 (six) hours as needed for wheezing or shortness of breath.    [provider]  busPIRone (BUSPAR) 10 MG tablet Take 1 tablet (10 mg total) by mouth 3 (three) times daily. Patient not taking: Reported on 01/04/2020 07/13/19   Connye Burkitt, NP  fluticasone-salmeterol (ADVAIR HFA) [phone] MCG/ACT inhaler Inhale 2 puffs into the lungs 2 (two) times daily. Patient not taking: Reported on  01/04/2020 08/03/15   Timmothy Euler, MD  hydrOXYzine (ATARAX/VISTARIL) 25 MG tablet Take 1 tablet (25 mg total) by mouth every 6 (six) hours as needed for anxiety. 07/06/19   Connye Burkitt, NP  naproxen (NAPROSYN) 375 MG tablet Take 1 tablet (375 mg total) by mouth 2 (two) times daily with a meal. Patient not taking: Reported on 01/04/2020 11/23/19   Jaynee Eagles, PA-C  ondansetron (ZOFRAN) 4 MG tablet Take 1 tablet (4 mg total) by mouth every 8 (eight) hours as needed for nausea or vomiting. 01/08/20   Loni Beckwith, PA-C  pantoprazole (PROTONIX) 40 MG tablet Take 1 tablet (40 mg total) by mouth daily. Patient not taking: Reported on 01/04/2020 06/16/19   Lajean Saver, MD  risperiDONE (RISPERDAL) 2 MG tablet Take 1 tablet (2 mg total) by mouth in the morning. Patient not taking: Reported on 01/04/2020 07/06/19   Connye Burkitt, NP  risperiDONE (RISPERDAL) 3 MG tablet Take 1 tablet (3 mg total) by mouth at bedtime. Patient not taking: Reported on 01/04/2020 07/06/19   Connye Burkitt, NP  sertraline (ZOLOFT) 50 MG tablet Take 3 tablets (150 mg total) by mouth daily. Patient not taking: Reported on 01/04/2020 07/14/19   Connye Burkitt, NP  traZODone (DESYREL) 100 MG tablet Take 2 tablets (200 mg total) by mouth at bedtime as needed for sleep. Patient not taking: Reported on 01/04/2020 07/13/19   Connye Burkitt, NP    Allergies    Bee venom, Coconut flavor, Geodon [ziprasidone hcl], Haloperidol and related, Lithium, Oxycodone, Quetiapine, Seroquel [quetiapine fumarate], Shellfish allergy, Phenergan [promethazine hcl], Prilosec [omeprazole], Sulfa antibiotics, Tegretol [carbamazepine], and Tape  Review of Systems   Review of Systems  Constitutional: Positive for appetite change and chills. Negative for diaphoresis, fatigue and fever.  HENT: Negative for sore throat.   Eyes: Negative for visual disturbance.  Respiratory: Negative for cough and shortness of breath.   Cardiovascular: Negative  for chest pain.  Gastrointestinal: Positive for abdominal pain, diarrhea, nausea and vomiting. Negative for abdominal distention, blood in stool and constipation.  Genitourinary: Negative for difficulty urinating, dysuria, hematuria, pelvic pain, urgency and vaginal bleeding.  Musculoskeletal: Negative for back pain and neck pain.  Skin: Negative for color change and rash.  Neurological: Positive for headaches. Negative for dizziness, syncope and light-headedness.  Psychiatric/Behavioral: Negative for confusion.    Physical Exam Updated Vital Signs BP (!) 111/57 (BP Location: Right Arm)   Pulse 91   Temp 98.6 F (37 C) (Oral)   Resp 16   Ht 5\' 5"  (1.651 m)   Wt 93 kg   SpO2 97%  BMI 34.12 kg/m   Physical Exam Constitutional:      General: She is not in acute distress.    Appearance: She is obese. She is not ill-appearing, toxic-appearing or diaphoretic.  HENT:     Head: Normocephalic.  Cardiovascular:     Rate and Rhythm: Normal rate.  Pulmonary:     Effort: Pulmonary effort is normal. No respiratory distress.  Abdominal:     General: Abdomen is protuberant. Bowel sounds are normal.     Palpations: Abdomen is soft. There is no mass or pulsatile mass.     Tenderness: There is abdominal tenderness in the right upper quadrant and epigastric area. There is no right CVA tenderness or left CVA tenderness. Positive signs include Murphy's sign. Negative signs include McBurney's sign.     Hernia: No hernia is present.  Skin:    General: Skin is warm and dry.  Neurological:     General: No focal deficit present.     Mental Status: She is alert.  Psychiatric:        Behavior: Behavior is cooperative.     ED Results / Procedures / Treatments   Labs (all labs ordered are listed, but only abnormal results are displayed) Labs Reviewed  COMPREHENSIVE METABOLIC PANEL - Abnormal; Notable for the following components:      Result Value   CO2 20 (*)    Glucose, Bld 103 (*)    All  other components within normal limits  URINALYSIS, ROUTINE W REFLEX MICROSCOPIC - Abnormal; Notable for the following components:   Hgb urine dipstick SMALL (*)    Bacteria, UA RARE (*)    All other components within normal limits  LIPASE, BLOOD  CBC  HCG, SERUM, QUALITATIVE  CBG MONITORING, ED    EKG None  Radiology US Abdomen Limited RUQ (LIVER/GB)  Result Date: 01/08/2020 CLINICAL DATA:  Epigastric pain for 1 week EXAM: ULTRASOUND ABDOMEN LIMITED RIGHT UPPER QUADRANT COMPARISON:  06/16/2019 FINDINGS: Gallbladder: No gallstones or wall thickening visualized. No sonographic Murphy sign noted by sonographer. Common bile duct: Diameter: 2 mm Liver: Liver echotexture is increased consistent with hepatic steatosis. No focal liver abnormality. Portal vein is patent on color Doppler imaging with normal direction of blood flow towards the liver. Other: None. IMPRESSION: 1. Stable hepatic steatosis. 2. Otherwise unremarkable right upper quadrant ultrasound. Electronically Signed   By: Randa Ngo M.D.   On: 01/08/2020 17:46    Procedures Procedures (including critical care time)  Medications Ordered in ED Medications  ondansetron (ZOFRAN-ODT) disintegrating tablet 4 mg (4 mg Oral Given 01/08/20 0624)  ondansetron (ZOFRAN) injection 4 mg (4 mg Intravenous Given 01/08/20 1619)  sodium chloride 0.9 % bolus 1,000 mL (0 mLs Intravenous Stopped 01/08/20 1730)  alum & mag hydroxide-simeth (MAALOX/MYLANTA) 200-200-20 MG/5ML suspension 30 mL (30 mLs Oral Given 01/08/20 1620)    And  lidocaine (XYLOCAINE) 2 % viscous mouth solution 15 mL (15 mLs Oral Given 01/08/20 1620)    ED Course  I have reviewed the triage vital signs and the nursing notes.  Pertinent labs & imaging results that were available during my care of the patient were reviewed by me and considered in my medical decision making (see chart for details).  Clinical Course as of Jan 08 1840  Fri Jan 08, 2020  1617 Was notified by RN  that pt reported saying, stated "I cant feel my legs."  She was able to ambulate to the hospital bed earlier without difficulty.  Patient not bring up  this complaint earlier on interview.  Responded to painful stimuli bilaterally, negative Babinski sign, patiently actively resisted bending her knees early, after examining her she was seen to voluntarily cross her legs without difficulty.     [PB]    Clinical Course User Index [PB] Loni Beckwith, PA-C   MDM Rules/Calculators/A&P                          Patient is an alert 29 year old female in no acute distress with of asthma, underlying personality disorder, schizoaffective disorder bipolar type, and DUB.  She complains of right upper quadrant and epigastric abdominal pain; pain was present x1 week, worsening, described as sharp and constant, 10/10 on pain scale, no radiation of pain.  Patient associates associated nausea, vomiting, diarrhea and chills.  No hematemesis, abdominal source, hematochezia, blood in stool or melena.  Patient denies any history of abdominal surgeries.  She denies any alcohol, tobacco, or illicit drug use.  On physical exam abdomen is nondistended, soft with tenderness to upper right quadrant and epigastric with positive Murphy sign.    Lipase was within normal limits making pancreatitis less likely.  CBC was unremarkable making infection less likely.  Beta-hCG was negative making ectopic pregnancy less likely.  UA showed small amount of blood and rare bacteria making UTI or pyelonephritis less likely.  CMP was unremarkable.  Due to patient's complaint of upper right quadrant pain, noted on exam and positive Murphy sign as well as worsening pain with food gallbladder disease is suspected.  Right upper quadrant ultrasound pending.    Patient was given Zofran for her nausea, GI cocktail, and a normal saline fluid bolus.  Serial abdominal exams unremarkable with no signs of peritonitis.  Ultrasound showed stable hepatic  steatosis, and otherwise unremarkable right upper quadrant ultrasound.  She was able to tolerate p.o. liquids.  She was given a prescription of Zofran.  Has a recent prescription for Protonix and reports she has not been taking it.  She was told to take her Protonix as prescribed.  Patient was told to use a BRAT diet.  Patient was told to follow-up with GI.  She was given strict return precautions.  Patient expressed understanding of all instructions.       Final Clinical Impression(s) / ED Diagnoses Final diagnoses:  Right upper quadrant pain  Epigastric abdominal pain  Nausea    Rx / DC Orders ED Discharge Orders         Ordered    ondansetron (ZOFRAN) 4 MG tablet  Every 8 hours PRN        01/08/20 1833           Loni Beckwith, PA-C 01/08/20 1846    Quintella Reichert, MD 01/09/20 [phone]

## 2020-01-08 NOTE — ED Notes (Signed)
Pt moved legs spontaneously without command. Pt would not move legs on command. Pt talking out loud to herself. This RN asked pt if she was having any auditory or visual hallucinations. Pt denies any hallucinations at this time. Pt states "it helps calm me"

## 2020-01-08 NOTE — ED Triage Notes (Signed)
Pt presents via [facility], c/o abd pain, n/v x 1 wk getting worse over the last day. Hx pancreatitis and states that it feels similar, denies fevers

## 2020-01-08 NOTE — ED Notes (Signed)
Pt just stated "i cant feel my legs" Pt ambulated to stretcher without difficulty. Pt withdrew from painful stimuli. Pt had movement in both extremities. PA aware

## 2020-01-08 NOTE — ED Notes (Signed)
Provided pt with ginger ale, crackers refused. Pt tolerated sips of ginger ale through straw but stated that pain increased from 3/10 to 10/10 as soon as she swallowed. Reports sharp, constant midepigastric pain. Denies nausea. No vomiting.

## 2020-01-08 NOTE — Discharge Instructions (Signed)
Today you came to the emergency department to have your abdominal pain evaluated.  Your labs were reassuring and your ultrasound showed no was with your gallbladder.  I have prescribed you Zofran to take as needed every 8 hours for nausea.  Please continue to take your prescription of Protonix as prescribed.  I have given you information to follow-up with a GI doctor, please call to schedule an appointment.  You can try to eat a bland diet as outlined in the attached handout or if necessary a clear liquid diet as outlined in the attached handout.  Get help right away if: You cannot stop vomiting. Your pain is only in areas of the abdomen, such as the right side or the left lower portion of the abdomen. Pain on the right side could be caused by appendicitis. You have bloody or black stools, or stools that look like tar. You have severe pain, cramping, or bloating in your abdomen. You have signs of dehydration, such as: Dark urine, very little urine, or no urine. Cracked lips. Dry mouth. Sunken eyes. Sleepiness. Weakness. You have trouble breathing or chest pain.

## 2020-01-09 NOTE — SANE Note (Signed)
Follow-up Phone Call  Patient gives verbal consent for a FNE/SANE follow-up phone call in 48-72 hours: No Patient's telephone number: n/a Patient gives verbal consent to leave voicemail at the phone number listed above: No DO NOT CALL between the hours of: Does not wish to talk about it further
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
## 2020-01-13 ENCOUNTER — Emergency Department (HOSPITAL_COMMUNITY)
Admission: EM | Admit: 2020-01-13 | Discharge: 2020-01-13 | Disposition: A | Discharge status: Home / Self Care | Payer: PPO | Attending: Emergency Medicine | Admitting: Emergency Medicine

## 2020-01-13 ENCOUNTER — Other Ambulatory Visit: Payer: Self-pay

## 2020-01-13 ENCOUNTER — Ambulatory Visit (HOSPITAL_COMMUNITY)
Admission: EM | Admit: 2020-01-13 | Discharge: 2020-01-14 | Disposition: A | Discharge status: Home / Self Care | Payer: PPO | Attending: Nurse Practitioner | Admitting: Nurse Practitioner

## 2020-01-13 ENCOUNTER — Encounter (HOSPITAL_COMMUNITY): Payer: Self-pay | Admitting: *Deleted

## 2020-01-13 DIAGNOSIS — R112 Nausea with vomiting, unspecified: Secondary | ICD-10-CM | POA: Diagnosis not present

## 2020-01-13 DIAGNOSIS — Z5321 Procedure and treatment not carried out due to patient leaving prior to being seen by health care provider: Secondary | ICD-10-CM | POA: Insufficient documentation

## 2020-01-13 DIAGNOSIS — R45851 Suicidal ideations: Secondary | ICD-10-CM | POA: Diagnosis not present

## 2020-01-13 DIAGNOSIS — R110 Nausea: Secondary | ICD-10-CM | POA: Diagnosis not present

## 2020-01-13 DIAGNOSIS — F603 Borderline personality disorder: Secondary | ICD-10-CM | POA: Diagnosis not present

## 2020-01-13 DIAGNOSIS — R109 Unspecified abdominal pain: Secondary | ICD-10-CM | POA: Insufficient documentation

## 2020-01-13 DIAGNOSIS — R1010 Upper abdominal pain, unspecified: Secondary | ICD-10-CM | POA: Diagnosis not present

## 2020-01-13 DIAGNOSIS — G43909 Migraine, unspecified, not intractable, without status migrainosus: Secondary | ICD-10-CM | POA: Diagnosis not present

## 2020-01-13 DIAGNOSIS — R52 Pain, unspecified: Secondary | ICD-10-CM | POA: Diagnosis not present

## 2020-01-13 DIAGNOSIS — R1084 Generalized abdominal pain: Secondary | ICD-10-CM | POA: Diagnosis not present

## 2020-01-13 DIAGNOSIS — R1111 Vomiting without nausea: Secondary | ICD-10-CM | POA: Diagnosis not present

## 2020-01-13 LAB — CBC
HCT: 39.2 % (ref 36.0–46.0)
Hemoglobin: 12.7 g/dL (ref 12.0–15.0)
MCH: 26.6 pg (ref 26.0–34.0)
MCHC: 32.4 g/dL (ref 30.0–36.0)
MCV: 82.2 fL (ref 80.0–100.0)
Platelets: 271 10*3/uL (ref 150–400)
RBC: 4.77 MIL/uL (ref 3.87–5.11)
RDW: 13.6 % (ref 11.5–15.5)
WBC: 7.6 10*3/uL (ref 4.0–10.5)
nRBC: 0 % (ref 0.0–0.2)

## 2020-01-13 LAB — COMPREHENSIVE METABOLIC PANEL
ALT: 17 U/L (ref 0–44)
AST: 21 U/L (ref 15–41)
Albumin: 4.2 g/dL (ref 3.5–5.0)
Alkaline Phosphatase: 65 U/L (ref 38–126)
Anion gap: 10 (ref 5–15)
BUN: 11 mg/dL (ref 6–20)
CO2: 22 mmol/L (ref 22–32)
Calcium: 9.4 mg/dL (ref 8.9–10.3)
Chloride: 107 mmol/L (ref 98–111)
Creatinine, Ser: 0.7 mg/dL (ref 0.44–1.00)
GFR, Estimated: >60 mL/min (ref >60)
Glucose, Bld: 94 mg/dL (ref 70–99)
Potassium: 3.7 mmol/L (ref 3.5–5.1)
Sodium: 139 mmol/L (ref 135–145)
Total Bilirubin: 0.3 mg/dL (ref 0.3–1.2)
Total Protein: 7 g/dL (ref 6.5–8.1)

## 2020-01-13 LAB — I-STAT BETA HCG BLOOD, ED (MC, WL, AP ONLY): I-stat hCG, quantitative: <5 m[IU]/mL (ref <5)

## 2020-01-13 LAB — LIPASE, BLOOD: Lipase: 38 U/L (ref 11–51)
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
## 2020-01-13 NOTE — ED Notes (Signed)
Was told by secretary pt left. Pt called 3 x times
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
## 2020-01-13 NOTE — ED Provider Notes (Signed)
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 significantly. A range of 10-30 ug/mL  may be an effective concentration for many patients. However, some  are best treated at concentrations outside of this range. Acetaminophen concentrations >150 ug/mL at 4 hours after  ingestion  and >50 ug/mL at 12 hours after ingestion are often associated with  toxic reactions.  Performed at [facility], [place] [address], [place], [place] [address]   . Magnesium 08/02/2019 1.9  1.7 - 2.4 mg/dL Final   Performed at [place] [address], [place], [place] [address]  . SARS Coronavirus 2 08/02/2019 NEGATIVE  NEGATIVE Final   Comment: (NOTE) SARS-CoV-2 target nucleic acids are NOT DETECTED.  The SARS-CoV-2 RNA is generally detectable in upper and lower respiratory specimens during the acute phase of infection. The lowest concentration of SARS-CoV-2 viral copies this assay can detect is 250 copies / mL. A negative result does not preclude SARS-CoV-2 infection and should not be used as the sole basis for treatment or other patient management decisions.  A negative result may occur with improper specimen collection / handling, submission of specimen other than nasopharyngeal swab, presence of viral mutation(s) within the areas targeted by this assay, and inadequate number of viral copies (<250 copies / mL). A negative result must be combined with clinical observations, patient history, and epidemiological information.  Fact Sheet for Patients:   StrictlyIdeas.no  Fact Sheet for Healthcare Providers: BankingDealers.co.za  This test is not yet approved or                           cleared by the [place] FDA and has been authorized for detection and/or diagnosis of SARS-CoV-2 by FDA under an Emergency Use Authorization (EUA).  This EUA will remain in effect (meaning this test can be used) for the duration of the COVID-19 declaration under Section 564(b)(1) of the Act, 21 U.S.C. section 360bbb-3(b)(1), unless the authorization is terminated or revoked sooner.  Performed at [facility], [place] [address] [place], [place] [address]   Admission on  07/31/2019, Discharged on 07/31/2019  Component Date Value Ref Range Status  . Chlamydia 07/31/2019 Negative   Final  . Neisseria Gonorrhea 07/31/2019 Negative   Final  . Comment 07/31/2019 Normal Reference Ranger Chlamydia - Negative   Final  . Comment 07/31/2019 Normal Reference Range Neisseria Gonorrhea - Negative   Final  . Color, Urine 07/31/2019 YELLOW  YELLOW Final  . APPearance 07/31/2019 CLEAR  CLEAR Final  . Specific Gravity, Urine 07/31/2019 1.016  1.005 - 1.030 Final  . pH 07/31/2019 5.0  5.0 - 8.0 Final  . Glucose, UA 07/31/2019 NEGATIVE  NEGATIVE mg/dL Final  . Hgb urine dipstick 07/31/2019 NEGATIVE  NEGATIVE Final  . Bilirubin Urine 07/31/2019 NEGATIVE  NEGATIVE Final  . Ketones, ur 07/31/2019 NEGATIVE  NEGATIVE mg/dL Final  . Protein, ur 07/31/2019 NEGATIVE  NEGATIVE mg/dL Final  . Nitrite 07/31/2019 NEGATIVE  NEGATIVE Final  . Chalmers Guest 07/31/2019 NEGATIVE  NEGATIVE Final   Performed at [place] [address], [place], [place] [address]  . Yeast Wet Prep HPF POC 07/31/2019 NONE SEEN  NONE SEEN Final  . Trich, Wet Prep 07/31/2019 NONE SEEN  NONE SEEN Final  . Clue Cells Wet Prep HPF POC 07/31/2019 NONE SEEN  NONE SEEN Final  . WBC, Wet Prep HPF POC 07/31/2019 FEW* NONE SEEN Final  . Sperm 07/31/2019 NONE SEEN   Final   Performed at [facility], [place] [address] [place], [place] [address]  Admission on 07/31/2019, Discharged on 07/31/2019  Component Date Value Ref Range Status  . Lipase 07/31/2019 38  11 - 51 U/L Final   Performed at [facility], [place] [address], [place], [place] [address]  . Sodium 07/31/2019 139  135 - 145 mmol/L Final  . Potassium 07/31/2019 4.0  3.5 - 5.1 mmol/L Final  . Chloride 07/31/2019 104  98 - 111 mmol/L Final  . CO2 07/31/2019 24  22 - 32 mmol/L Final  . Glucose, Bld 07/31/2019 102* 70 - 99 mg/dL Final   Glucose reference range applies only to samples taken after fasting for at least 8  hours.  . BUN 07/31/2019 6  6 - 20 mg/dL Final  . Creatinine, Ser 07/31/2019 0.90  0.44 - 1.00 mg/dL Final  . Calcium 07/31/2019 9.3  8.9 - 10.3 mg/dL Final  . Total Protein 07/31/2019 7.0  6.5 - 8.1 g/dL Final  . Albumin 07/31/2019 4.2  3.5 - 5.0 g/dL Final  . AST 07/31/2019 19  15 - 41 U/L Final  . ALT 07/31/2019 16  0 - 44 U/L Final  . Alkaline Phosphatase 07/31/2019 64  38 - 126 U/L Final  . Total Bilirubin 07/31/2019 0.5  0.3 - 1.2 mg/dL Final  . GFR calc non Af Amer 07/31/2019 >60  >60 mL/min Final  . GFR calc Af Amer 07/31/2019 >60  >60 mL/min Final  . Anion gap 07/31/2019 11  5 - 15 Final   Performed at [place] [address] [place], [place] [address]  . WBC 07/31/2019 8.8  4.0 - 10.5 K/uL Final  . RBC 07/31/2019 4.96  3.87 - 5.11 MIL/uL Final  . Hemoglobin 07/31/2019 13.2  12.0 - 15.0 g/dL Final  . HCT 07/31/2019 41.3  36.0 - 46.0 % Final  . MCV 07/31/2019 83.3  80.0 - 100.0 fL Final  . MCH 07/31/2019 26.6  26.0 - 34.0 pg Final  . MCHC 07/31/2019 32.0  30.0 - 36.0 g/dL Final  . RDW 07/31/2019 12.4  11.5 - 15.5 % Final  . Platelets 07/31/2019 271  150 - 400 K/uL Final  . nRBC 07/31/2019 0.0  0.0 - 0.2 % Final   Performed at [facility], [place] [address], [place], [place] [address]  . Color, Urine 07/31/2019 YELLOW  YELLOW Final  . APPearance 07/31/2019 HAZY* CLEAR Final  . Specific Gravity, Urine 07/31/2019 1.021  1.005 - 1.030 Final  . pH 07/31/2019 7.0  5.0 - 8.0 Final  . Glucose, UA 07/31/2019 NEGATIVE  NEGATIVE mg/dL Final  . Hgb urine dipstick 07/31/2019 NEGATIVE  NEGATIVE Final  . Bilirubin Urine 07/31/2019 NEGATIVE  NEGATIVE Final  . Ketones, ur 07/31/2019 NEGATIVE  NEGATIVE mg/dL Final  . Protein, ur 07/31/2019 NEGATIVE  NEGATIVE mg/dL Final  .  Nitrite 07/31/2019 NEGATIVE  NEGATIVE Final  . Chalmers Guest 07/31/2019 NEGATIVE  NEGATIVE Final   Performed at [facility], [place] [address] [place], [place] [address]  . I-stat hCG, quantitative  07/31/2019 <5.0  <5 mIU/mL Final  . Comment 3 07/31/2019          Final   Comment:   GEST. AGE      CONC.  (mIU/mL)   <=1 WEEK        5 - 50     2 WEEKS       50 - 500     3 WEEKS       100 - 10,000     4 WEEKS     1,000 - 30,000        FEMALE AND NON-PREGNANT FEMALE:     LESS THAN 5 mIU/mL   Admission on 07/27/2019, Discharged on 07/27/2019  Component Date Value Ref Range Status  . Sodium 07/27/2019 138  135 - 145 mmol/L Final  . Potassium 07/27/2019 3.8  3.5 - 5.1 mmol/L Final  . Chloride 07/27/2019 103  98 - 111 mmol/L Final  . CO2 07/27/2019 25  22 - 32 mmol/L Final  . Glucose, Bld 07/27/2019 95  70 - 99 mg/dL Final   Glucose reference range applies only to samples taken after fasting for at least 8 hours.  . BUN 07/27/2019 17  6 - 20 mg/dL Final  . Creatinine, Ser 07/27/2019 0.89  0.44 - 1.00 mg/dL Final  . Calcium 07/27/2019 9.5  8.9 - 10.3 mg/dL Final  . GFR calc non Af Amer 07/27/2019 >60  >60 mL/min Final  . GFR calc Af Amer 07/27/2019 >60  >60 mL/min Final  . Anion gap 07/27/2019 10  5 - 15 Final   Performed at [facility], [place] [address], [place], [place] [address]  . WBC 07/27/2019 10.0  4.0 - 10.5 K/uL Final  . RBC 07/27/2019 4.84  3.87 - 5.11 MIL/uL Final  . Hemoglobin 07/27/2019 12.6  12.0 - 15.0 g/dL Final  . HCT 07/27/2019 40.2  36.0 - 46.0 % Final  . MCV 07/27/2019 83.1  80.0 - 100.0 fL Final  . MCH 07/27/2019 26.0  26.0 - 34.0 pg Final  . MCHC 07/27/2019 31.3  30.0 - 36.0 g/dL Final  . RDW 07/27/2019 12.4  11.5 - 15.5 % Final  . Platelets 07/27/2019 251  150 - 400 K/uL Final  . nRBC 07/27/2019 0.0  0.0 - 0.2 % Final   Performed at [facility], [place] [address] [place], [place] [address]  . Troponin I (High Sensitivity) 07/27/2019 <2  <18 ng/L Final   Comment: (NOTE) Elevated high sensitivity troponin I (hsTnI) values and significant  changes across serial measurements may suggest ACS but many other  chronic and acute conditions are known to  elevate hsTnI results.  Refer to the "Links" section for chest pain algorithms and additional  guidance. Performed at [facility], [place] [address], [place], [place] [address]   . I-stat hCG, quantitative 07/27/2019 <5.0  <5 mIU/mL Final  . Comment 3 07/27/2019          Final   Comment:   GEST. AGE      CONC.  (mIU/mL)   <=1 WEEK        5 - 50     2 WEEKS       50 - 500     3 WEEKS  100 - 10,000     4 WEEKS     1,000 - 30,000        FEMALE AND NON-PREGNANT FEMALE:     LESS THAN 5 mIU/mL   . Troponin I (High Sensitivity) 07/27/2019 <2  <18 ng/L Final   Comment: (NOTE) Elevated high sensitivity troponin I (hsTnI) values and significant  changes across serial measurements may suggest ACS but many other  chronic and acute conditions are known to elevate hsTnI results.  Refer to the "Links" section for chest pain algorithms and additional  guidance. Performed at [facility], [place] [address], [place], [place] [address]   . Alcohol, Ethyl (B) 07/27/2019 <10  <10 mg/dL Final   Comment: (NOTE) Lowest detectable limit for serum alcohol is 10 mg/dL.  For medical purposes only. Performed at [facility], [place] [address], [place], [place] [address]   . Salicylate Lvl 83/38/2505 <7.0* 7.0 - 30.0 mg/dL Final   Performed at [place] [address], [place], [place] [address]  . Acetaminophen (Tylenol), Serum 07/27/2019 <10* 10 - 30 ug/mL Final   Comment: (NOTE) Therapeutic concentrations vary significantly. A range of 10-30 ug/mL  may be an effective concentration for many patients. However, some  are best treated at concentrations outside of this range. Acetaminophen concentrations >150 ug/mL at 4 hours after ingestion  and >50 ug/mL at 12 hours after ingestion are often associated with  toxic reactions.  Performed at [facility], [place] [address], [place], [place] [address]   Admission on 07/26/2019, Discharged on 07/27/2019  Component Date Value  Ref Range Status  . POC Amphetamine UR 07/26/2019 None Detected  None Detected Preliminary  . POC Secobarbital (BAR) 07/26/2019 None Detected  None Detected Preliminary  . POC Buprenorphine (BUP) 07/26/2019 None Detected  None Detected Preliminary  . POC Oxazepam (BZO) 07/26/2019 None Detected  None Detected Preliminary  . POC Cocaine UR 07/26/2019 None Detected  None Detected Preliminary  . POC Methamphetamine UR 07/26/2019 None Detected  None Detected Preliminary  . POC Morphine 07/26/2019 None Detected  None Detected Preliminary  . POC Oxycodone UR 07/26/2019 None Detected  None Detected Preliminary  . POC Methadone UR 07/26/2019 None Detected  None Detected Preliminary  . POC Marijuana UR 07/26/2019 None Detected  None Detected Preliminary  . Preg Test, Ur 07/26/2019 NEGATIVE  NEGATIVE Final   Comment:        THE SENSITIVITY OF THIS METHODOLOGY IS >24 mIU/mL   There may be more visits with results that are not included.    Allergies: Bee venom, Coconut flavor, Geodon [ziprasidone hcl], Haloperidol and related, Lithium, Oxycodone, Quetiapine, Seroquel [quetiapine fumarate], Shellfish allergy, Phenergan [promethazine hcl], Prilosec [omeprazole], Sulfa antibiotics, Tegretol [carbamazepine], and Tape  PTA Medications: (Not in a hospital admission)   Medical Decision Making  Patient was evaluated in the ED for N/V on 01/13/2020 CBC and CMP completed on 01/13/20 unremarkable  Continue home medications -buspar 10 mg TID prn for anxiety -risperdal 2 mg daily and 3 mg QHS for mood stability -zoloft 150 mg daily for depression -trazodone 200 mg QHS prn for sleep -protonix 40 mg daily for GERD    Clinical Course as of 01/14/20 0739  Thu Jan 14, 2020  0342 POCT Urine Drug Screen - (ICup) UDS negative [JB]  0343 Preg Test, Ur: NEGATIVE [JB]    Clinical Course User Index [JB] Rozetta Nunnery, NP    Recommendations  Based on my evaluation the  patient does not appear to have an  emergency medical condition.   Patient will be placed in the continuous assessment area at [facility] for treatment and stabilization. She will be reevaluated on 01/14/2020. The treatment team will determine disposition at that time.     Rozetta Nunnery, NP 01/14/20  7:39 AM

## 2020-01-13 NOTE — ED Triage Notes (Signed)
Pt arrived by [facility] for NV and abd pain x 2 weeks. Has been seen at [facility] without improvement of symptoms. Reports nausea and vomiting after eating and migraines

## 2020-01-14 ENCOUNTER — Encounter (HOSPITAL_COMMUNITY): Payer: Self-pay | Admitting: Emergency Medicine

## 2020-01-14 ENCOUNTER — Other Ambulatory Visit: Payer: Self-pay

## 2020-01-14 DIAGNOSIS — F603 Borderline personality disorder: Secondary | ICD-10-CM

## 2020-01-14 DIAGNOSIS — Z79899 Other long term (current) drug therapy: Secondary | ICD-10-CM | POA: Diagnosis not present

## 2020-01-14 DIAGNOSIS — Z885 Allergy status to narcotic agent status: Secondary | ICD-10-CM | POA: Diagnosis not present

## 2020-01-14 DIAGNOSIS — Z888 Allergy status to other drugs, medicaments and biological substances status: Secondary | ICD-10-CM | POA: Diagnosis not present

## 2020-01-14 DIAGNOSIS — Z882 Allergy status to sulfonamides status: Secondary | ICD-10-CM | POA: Insufficient documentation

## 2020-01-14 DIAGNOSIS — Z630 Problems in relationship with spouse or partner: Secondary | ICD-10-CM | POA: Insufficient documentation

## 2020-01-14 DIAGNOSIS — Z9151 Personal history of suicidal behavior: Secondary | ICD-10-CM | POA: Insufficient documentation

## 2020-01-14 DIAGNOSIS — R45851 Suicidal ideations: Secondary | ICD-10-CM | POA: Diagnosis not present

## 2020-01-14 DIAGNOSIS — Z20822 Contact with and (suspected) exposure to covid-19: Secondary | ICD-10-CM | POA: Diagnosis not present

## 2020-01-14 DIAGNOSIS — R45850 Homicidal ideations: Secondary | ICD-10-CM | POA: Insufficient documentation

## 2020-01-14 DIAGNOSIS — F319 Bipolar disorder, unspecified: Secondary | ICD-10-CM | POA: Insufficient documentation

## 2020-01-14 DIAGNOSIS — Z87891 Personal history of nicotine dependence: Secondary | ICD-10-CM | POA: Diagnosis not present

## 2020-01-14 LAB — POCT URINE DRUG SCREEN - MANUAL ENTRY (I-SCREEN)
POC Amphetamine UR: NOT DETECTED
POC Buprenorphine (BUP): NOT DETECTED
POC Cocaine UR: NOT DETECTED
POC Marijuana UR: NOT DETECTED
POC Methadone UR: NOT DETECTED
POC Methamphetamine UR: NOT DETECTED
POC Morphine: NOT DETECTED
POC Oxazepam (BZO): NOT DETECTED
POC Oxycodone UR: NOT DETECTED
POC Secobarbital (BAR): NOT DETECTED

## 2020-01-14 LAB — POC SARS CORONAVIRUS 2 AG -  ED: SARS Coronavirus 2 Ag: NEGATIVE

## 2020-01-14 LAB — RESP PANEL BY RT-PCR (FLU A&B, COVID) ARPGX2
Influenza A by PCR: NEGATIVE
Influenza B by PCR: NEGATIVE
SARS Coronavirus 2 by RT PCR: NEGATIVE

## 2020-01-14 LAB — POCT PREGNANCY, URINE: Preg Test, Ur: NEGATIVE

## 2020-01-14 MED ORDER — RISPERIDONE 2 MG PO TABS
2.0000 mg | ORAL_TABLET | Freq: Every morning | ORAL | Status: DC
  Administered 2020-01-14: 2 mg via ORAL
  Filled 2020-01-14 (×2): qty 1

## 2020-01-14 MED ORDER — RISPERIDONE 3 MG PO TABS
3.0000 mg | ORAL_TABLET | Freq: Every day | ORAL | Status: DC
  Administered 2020-01-14: 3 mg via ORAL
  Filled 2020-01-14: qty 1

## 2020-01-14 MED ORDER — ALBUTEROL SULFATE HFA 108 (90 BASE) MCG/ACT IN AERS
1.0000 | INHALATION_SPRAY | Freq: Four times a day (QID) | RESPIRATORY_TRACT | Status: DC | PRN
  Filled 2020-01-14: qty 6.7

## 2020-01-14 MED ORDER — MAGNESIUM HYDROXIDE 400 MG/5ML PO SUSP
30.0000 mL | Freq: Every day | ORAL | Status: DC | PRN
  Filled 2020-01-14: qty 30

## 2020-01-14 MED ORDER — ONDANSETRON HCL 4 MG PO TABS: 4.0000 mg | ORAL_TABLET | Freq: Three times a day (TID) | ORAL | Status: DC | PRN

## 2020-01-14 MED ORDER — TRAZODONE HCL 100 MG PO TABS
200.0000 mg | ORAL_TABLET | Freq: Every evening | ORAL | Status: DC | PRN
  Administered 2020-01-14: 200 mg via ORAL
  Filled 2020-01-14: qty 2

## 2020-01-14 MED ORDER — PANTOPRAZOLE SODIUM 40 MG PO TBEC
40.0000 mg | DELAYED_RELEASE_TABLET | Freq: Every day | ORAL | Status: DC
  Administered 2020-01-14: 40 mg via ORAL
  Filled 2020-01-14: qty 1

## 2020-01-14 MED ORDER — ACETAMINOPHEN 325 MG PO TABS
650.0000 mg | ORAL_TABLET | Freq: Four times a day (QID) | ORAL | Status: DC | PRN
  Filled 2020-01-14: qty 2

## 2020-01-14 MED ORDER — ALUM & MAG HYDROXIDE-SIMETH 200-200-20 MG/5ML PO SUSP
30.0000 mL | ORAL | Status: DC | PRN
  Administered 2020-01-14: 30 mL via ORAL

## 2020-01-14 MED ORDER — MOMETASONE FURO-FORMOTEROL FUM 200-5 MCG/ACT IN AERO
2.0000 | INHALATION_SPRAY | Freq: Two times a day (BID) | RESPIRATORY_TRACT | Status: DC
  Administered 2020-01-14: 2 via RESPIRATORY_TRACT
  Filled 2020-01-14: qty 8.8

## 2020-01-14 MED ORDER — TRAZODONE HCL 100 MG PO TABS: 200.0000 mg | ORAL_TABLET | Freq: Every evening | ORAL | 0 refills | Status: DC | PRN

## 2020-01-14 MED ORDER — BUSPIRONE HCL 5 MG PO TABS
10.0000 mg | ORAL_TABLET | Freq: Three times a day (TID) | ORAL | Status: DC
  Administered 2020-01-14: 10 mg via ORAL
  Filled 2020-01-14: qty 2

## 2020-01-14 MED ORDER — SUMATRIPTAN SUCCINATE 50 MG PO TABS
50.0000 mg | ORAL_TABLET | Freq: Once | ORAL | Status: AC
  Administered 2020-01-14: 50 mg via ORAL
  Filled 2020-01-14: qty 1

## 2020-01-14 MED ORDER — SERTRALINE HCL 50 MG PO TABS
150.0000 mg | ORAL_TABLET | Freq: Every day | ORAL | Status: DC
  Administered 2020-01-14: 150 mg via ORAL
  Filled 2020-01-14: qty 1

## 2020-01-14 MED ORDER — HYDROXYZINE HCL 25 MG PO TABS: 25.0000 mg | ORAL_TABLET | Freq: Four times a day (QID) | ORAL | Status: DC | PRN
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
## 2020-01-14 NOTE — ED Notes (Signed)
Patient A&O x 4, ambulatory. Patient discharged in no acute distress. Patient denied SI/HI, A/VH upon discharge. Patient verbalized understanding of all discharge instructions explained by staff, to include follow up appointments, RX's and safety plan. Pt belongings returned to patient from locker intact. Patient escorted to lobby via staff for transport to destination via [facility]. Safety maintained.

## 2020-01-14 NOTE — ED Notes (Signed)
Patient participating in Water quality scientist

## 2020-01-14 NOTE — Discharge Instructions (Addendum)
Discharge recommendations:  Patient is to take medications as prescribed. Please see information for follow-up appointment with psychiatry and therapy. Please follow up with your primary care provider for all medical related needs.   Therapy: We recommend that patient participate in individual therapy to address mental health concerns.  Medications: The patient is to contact a medical professional and/or outpatient provider to address any new side effects that develop. Patient should update outpatient providers of any new medications and/or medication changes.   Atypical antipsychotics: If you are prescribed an atypical antipsychotic, it is recommended that your height, weight, BMI, blood pressure, fasting lipid panel, and fasting blood sugar be monitored by your outpatient providers.  Safety:  The patient should abstain from use of illicit substances/drugs and abuse of any medications. If symptoms worsen or do not continue to improve or if the patient becomes actively suicidal or homicidal then it is recommended that the patient return to the closest hospital emergency department, the [facility], or call 911 for further evaluation and treatment. National Suicide Prevention Lifeline 1-800-SUICIDE or 5795762225.
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
## 2020-01-14 NOTE — ED Notes (Signed)
Pt sleeping at present, no distress noted, monitoring for safety. 

## 2020-01-14 NOTE — ED Notes (Signed)
Patient alert and oriented X 3, positive for SI, and HI this am. Rapid speech with flight of ideas. Patient received scheduled medications. Eating breakfast (yogurt) sitting on edge of bed.  Patient complained of constipation, given milk of mag. No objective signs of distress at this time. Nursing staff will continue to monitor.

## 2020-01-15 ENCOUNTER — Emergency Department (HOSPITAL_COMMUNITY)
Admission: EM | Admit: 2020-01-15 | Discharge: 2020-01-17 | Disposition: A | Discharge status: Other Acute Inpatient Hospital | Payer: PPO | Attending: Emergency Medicine | Admitting: Emergency Medicine

## 2020-01-15 ENCOUNTER — Encounter (HOSPITAL_COMMUNITY): Payer: Self-pay

## 2020-01-15 DIAGNOSIS — T1491XA Suicide attempt, initial encounter: Secondary | ICD-10-CM | POA: Diagnosis not present

## 2020-01-15 DIAGNOSIS — X838XXA Intentional self-harm by other specified means, initial encounter: Secondary | ICD-10-CM | POA: Diagnosis not present

## 2020-01-15 DIAGNOSIS — Z87891 Personal history of nicotine dependence: Secondary | ICD-10-CM | POA: Diagnosis not present

## 2020-01-15 DIAGNOSIS — F840 Autistic disorder: Secondary | ICD-10-CM | POA: Diagnosis not present

## 2020-01-15 DIAGNOSIS — I959 Hypotension, unspecified: Secondary | ICD-10-CM | POA: Diagnosis not present

## 2020-01-15 DIAGNOSIS — F603 Borderline personality disorder: Secondary | ICD-10-CM | POA: Diagnosis not present

## 2020-01-15 DIAGNOSIS — Z20822 Contact with and (suspected) exposure to covid-19: Secondary | ICD-10-CM | POA: Insufficient documentation

## 2020-01-15 DIAGNOSIS — J45909 Unspecified asthma, uncomplicated: Secondary | ICD-10-CM | POA: Diagnosis not present

## 2020-01-15 DIAGNOSIS — T50902A Poisoning by unspecified drugs, medicaments and biological substances, intentional self-harm, initial encounter: Secondary | ICD-10-CM | POA: Diagnosis not present

## 2020-01-15 DIAGNOSIS — T43592A Poisoning by other antipsychotics and neuroleptics, intentional self-harm, initial encounter: Secondary | ICD-10-CM | POA: Diagnosis not present

## 2020-01-15 DIAGNOSIS — T50904A Poisoning by unspecified drugs, medicaments and biological substances, undetermined, initial encounter: Secondary | ICD-10-CM | POA: Diagnosis not present

## 2020-01-15 DIAGNOSIS — F332 Major depressive disorder, recurrent severe without psychotic features: Secondary | ICD-10-CM | POA: Insufficient documentation

## 2020-01-15 DIAGNOSIS — R9431 Abnormal electrocardiogram [ECG] [EKG]: Secondary | ICD-10-CM | POA: Diagnosis not present

## 2020-01-15 DIAGNOSIS — T887XXA Unspecified adverse effect of drug or medicament, initial encounter: Secondary | ICD-10-CM | POA: Diagnosis not present

## 2020-01-15 DIAGNOSIS — Z7952 Long term (current) use of systemic steroids: Secondary | ICD-10-CM | POA: Insufficient documentation

## 2020-01-15 LAB — URINALYSIS, ROUTINE W REFLEX MICROSCOPIC
Bacteria, UA: NONE SEEN
Bilirubin Urine: NEGATIVE
Glucose, UA: NEGATIVE mg/dL
Ketones, ur: NEGATIVE mg/dL
Leukocytes,Ua: NEGATIVE
Nitrite: NEGATIVE
Protein, ur: NEGATIVE mg/dL
Specific Gravity, Urine: 1.001 — ABNORMAL LOW (ref 1.005–1.030)
pH: 6 (ref 5.0–8.0)

## 2020-01-15 LAB — CBC WITH DIFFERENTIAL/PLATELET
Abs Immature Granulocytes: 0.02 10*3/uL (ref 0.00–0.07)
Basophils Absolute: 0 10*3/uL (ref 0.0–0.1)
Basophils Relative: 0 %
Eosinophils Absolute: 0 10*3/uL (ref 0.0–0.5)
Eosinophils Relative: 0 %
HCT: 41.3 % (ref 36.0–46.0)
Hemoglobin: 13.1 g/dL (ref 12.0–15.0)
Immature Granulocytes: 0 %
Lymphocytes Relative: 34 %
Lymphs Abs: 2.5 10*3/uL (ref 0.7–4.0)
MCH: 26.4 pg (ref 26.0–34.0)
MCHC: 31.7 g/dL (ref 30.0–36.0)
MCV: 83.3 fL (ref 80.0–100.0)
Monocytes Absolute: 0.4 10*3/uL (ref 0.1–1.0)
Monocytes Relative: 5 %
Neutro Abs: 4.4 10*3/uL (ref 1.7–7.7)
Neutrophils Relative %: 61 %
Platelets: 230 10*3/uL (ref 150–400)
RBC: 4.96 MIL/uL (ref 3.87–5.11)
RDW: 13.7 % (ref 11.5–15.5)
WBC: 7.4 10*3/uL (ref 4.0–10.5)
nRBC: 0 % (ref 0.0–0.2)

## 2020-01-15 NOTE — ED Notes (Addendum)
Gave patient a urine cup to get sample, patient put what this writer believes to be water in cup. Sent to lab for analysis

## 2020-01-15 NOTE — ED Provider Notes (Addendum)
[facility] DE[facility] Provider Note   CSN: [id number] Arrival date & time: 01/15/20  2104     History Chief Complaint  Patient presents with   Ingestion   Suicide Attempt    [name] is a 29 y.o. female.  Patient to ED after overdosing on her medications, including "a bunch" of sertraline, Buspar, risperidone, hydroxyzine, Imitrex, Zofran, protonix, sucralfate. She reports to have ingested the medications one hour prior to arrival in the ED (9:30 pm). No vomiting. She has started having diarrhea since arrival to ED. No pain. She reports taking the overdose in a suicide attempt. The patient contacted EMS immediately after ingestion. Per chart review, the patient was seen and evaluated by [facility] yesterday for SI, stating she would overdose on medications, but that she stated she "couldn't do it" and that she "would never carry it out" citing that she had a daughter to be there for. History of depression, Borderline Personality.   The history is provided by the patient and the EMS personnel. No language interpreter was used.       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 conduct 08/03/2019   Overdose 07/22/2017   Intentional acetaminophen overdose ([place])    DUB (dysfunctional uterine bleeding) 11/22/2016   Hyperprolactinemia ([place]) 08/20/2016   Tachycardia 01/13/2016    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   Seizure disorder ([place]) 08/08/2015   Chronic migraine 07/27/2015   Asthma 04/15/2015   Schizoaffective disorder, bipolar type ([place]) 03/10/2014   PTSD (post-traumatic stress disorder) 03/10/2014   Suicidal ideations    Borderline personality disorder ([place]) 10/31/2013   Autism spectrum disorder 06/15/2013    Past Surgical History:  Procedure Laterality Date   MOUTH SURGERY  2009 or 2010     OB History    Gravida  2   Para  1   Term  1   Preterm      AB  1   Living  1     SAB  1   IAB      Ectopic      Multiple  0   Live Births  1           Family History  Problem Relation Age of Onset   Mental illness Father    Asthma Father    PDD Brother    Seizures Brother     Social History   Tobacco Use   Smoking status: Former Smoker    Packs/day: 0.00   Smokeless tobacco: Never Used   Tobacco comment: Smoked for 2  years age [age]  Vaping Use   Vaping Use: Never used  Substance Use Topics   Alcohol use: No    Alcohol/week: 1.0 standard drink    Types: 1 Standard drinks or  equivalent per week   Drug use: No    Comment: History of cocaine use at age 20 for 4 months    Home Medications Prior to Admission medications   Medication Sig Start Date End Date Taking? Authorizing Provider  albuterol (PROVENTIL HFA;VENTOLIN HFA) 108 (90 Base) MCG/ACT inhaler Inhale 1-2 puffs into the lungs every 6 (six) hours as needed for wheezing or shortness of breath.    [provider]  busPIRone (BUSPAR) 10 MG tablet Take 1 tablet (10 mg total) by mouth 3 (three) times daily. 07/13/19   Connye Burkitt, NP  fluticasone-salmeterol (ADVAIR HFA) [phone] MCG/ACT inhaler Inhale 2 puffs into the lungs 2 (two) times daily. Patient not taking: No sig reported 08/03/15   Timmothy Euler, MD  hydrOXYzine (ATARAX/VISTARIL) 25 MG tablet Take 1 tablet (25 mg total) by mouth every 6 (six) hours as needed for  anxiety. Patient not taking: Reported on 01/14/2020 07/06/19   Connye Burkitt, NP  ondansetron (ZOFRAN) 4 MG tablet Take 1 tablet (4 mg total) by mouth every 8 (eight) hours as needed for nausea or vomiting. 01/08/20   Loni Beckwith, PA-C  pantoprazole (PROTONIX) 40 MG tablet Take 1 tablet (40 mg total) by mouth daily. 06/16/19   Lajean Saver, MD  risperiDONE (RISPERDAL) 2 MG tablet Take 1 tablet (2 mg total) by mouth in the morning. 07/06/19   Connye Burkitt, NP  risperiDONE (RISPERDAL) 3 MG tablet Take 1 tablet (3 mg total) by mouth at bedtime. 07/06/19   Connye Burkitt, NP  sertraline (ZOLOFT) 50 MG tablet Take 3 tablets (150 mg total) by mouth daily. 07/14/19   Connye Burkitt, NP  sucralfate (CARAFATE) 1 g tablet Take 1 g by mouth 4 (four) times daily.    [provider]  traZODone (DESYREL) 100 MG tablet Take 2 tablets (200 mg total) by mouth at bedtime as needed for sleep. 01/14/20   Prescilla Sours, PA-C    Allergies    Bee venom, Coconut flavor, Geodon [ziprasidone hcl], Haloperidol and related, Lithium, Oxycodone, Quetiapine, Seroquel [quetiapine fumarate], Shellfish allergy, Phenergan [promethazine hcl], Prilosec [omeprazole], Sulfa antibiotics, Tegretol [carbamazepine], and Tape  Review of Systems   Review of Systems  Constitutional: Negative for chills and fever.  HENT: Negative.   Respiratory: Negative.  Negative for shortness of breath.   Cardiovascular: Negative.  Negative for chest pain.  Gastrointestinal: Positive for diarrhea. Negative for abdominal pain.  Musculoskeletal: Negative.   Skin: Negative.   Neurological: Negative.   Psychiatric/Behavioral: Positive for dysphoric mood, self-injury and suicidal ideas.    Physical Exam Updated Vital Signs BP 101/67 (BP Location: Right Arm)    Pulse 70    Temp 98.1 F (36.7 C) (Oral)    Resp 15    SpO2 96%   Physical Exam Vitals and nursing note reviewed.  Constitutional:      Appearance: She is well-developed and  well-nourished.  HENT:     Head: Normocephalic and atraumatic.  Eyes:     Conjunctiva/sclera: Conjunctivae normal.  Cardiovascular:     Rate and Rhythm: Normal rate and regular rhythm.  Pulmonary:     Effort: Pulmonary effort is normal.     Breath sounds: Normal breath sounds. No wheezing, rhonchi or rales.  Abdominal:     General: Bowel sounds are normal.     Palpations: Abdomen is soft.     Tenderness: There is no abdominal tenderness. There is no guarding or rebound.  Musculoskeletal:  General: Normal range of motion.     Cervical back: Normal range of motion and neck supple.  Skin:    General: Skin is warm and dry.     Findings: No rash.  Neurological:     General: No focal deficit present.     Mental Status: She is alert and oriented to person, place, and time.     Comments: Awake, alert. Ambulatory to and from bathroom without imbalance or weakness.   Psychiatric:        Mood and Affect: Mood and affect normal.     ED Results / Procedures / Treatments   Labs (all labs ordered are listed, but only abnormal results are displayed) Labs Reviewed  URINALYSIS, ROUTINE W REFLEX MICROSCOPIC - Abnormal; Notable for the following components:      Result Value   Color, Urine COLORLESS (*)    Specific Gravity, Urine 1.001 (*)    Hgb urine dipstick SMALL (*)    All other components within normal limits    EKG None  Radiology No results found.  Procedures Procedures (including critical care time) CRITICAL CARE Performed by: Dewaine Oats   Total critical care time: 60 minutes  Critical care time was exclusive of separately billable procedures and treating other patients.  Critical care was necessary to treat or prevent imminent or life-threatening deterioration.  Critical care was time spent personally by me on the following activities: development of treatment plan with patient and/or surrogate as well as nursing, discussions with consultants, evaluation of  patient's response to treatment, examination of patient, obtaining history from patient or surrogate, ordering and performing treatments and interventions, ordering and review of laboratory studies, ordering and review of radiographic studies, pulse oximetry and re-evaluation of patient's condition.  Medications Ordered in ED Medications - No data to display  ED Course  I have reviewed the triage vital signs and the nursing notes.  Pertinent labs & imaging results that were available during my care of the patient were reviewed by me and considered in my medical decision making (see chart for details).    MDM Rules/Calculators/A&P                           Patient to ED after reportedly overdosing on multiple medications. Time of ingestion estimated to be around 9:30 pm 01/15/20. Time seen by provider 12:15 am 01/16/20  Per Poison Control: EKG Sert -  CNS dep, tachycardia, hypertension, QTc prolong Resp - CNS dep, tachyc, hypotension, extrapyradm sym, QTc prlong, szs Busp - CNS dep Hydrox - CNS dep, tachycard, Qtc prolong Carafate - diarrhea Prontonix - benign  If QTc prolongation - maximize K+ (4 range) and mag (2) If tachycardia - fluids, Benzos If Seizure - Benzos Obs 6-8 hours Obtain 4 hour Tylenol  Patient initially mildly hypotensive. This is stable at 104/51 without concerning change.   12:45: She does develop tachycardia to 135 on EKG which also shows prolonged QTc of 579 (449 July 2021). No mental status change or excessive somnolence.   Potassium 3.6. Magnesium 1.6. Both supplemented per recommendation of Poison Control.   1:30 - Update with PC - Recheck EKG at 1-2 hours post K= and magnesium administration. If QTc remains prolonged, will recommend admission for serial EKG's until it normalizes at which time she will be medically cleared.   Tylenol level pending.   3:00 - Tylenol <10. Continues to be easily awakened. Tachycardia 109 - improved. Repeat EKG due  at  4:00 am and will determine further course.   4:30 - EKG shows improving QTc of 510. Mag and K+ on board. Patient has no mental status changes. Tachycardia resolved.  5:00 - Per Poison Control, patient cannot be medically cleared until QTc less than 500. Will recheck K+ and magnesium and augment if still outside the target values (4's for K+, 2's for Mag).   6:30 - Repeat EKG proves normal QTc of 487. She is considered medically cleared for psychiatric treatment of intentional overdose.    Final Clinical Impression(s) / ED Diagnoses Final diagnoses:  None   1. Suicide attempt 2. Intentional overdose  Rx / DC Orders ED Discharge Orders    None       Charlann Lange, PA-C 01/16/20 [place], [place], PA-C 01/16/20 0640    Charlann Lange, PA-C 01/16/20 0654    [place], Alvin Critchley, DO 01/18/20 [phone]

## 2020-01-15 NOTE — ED Triage Notes (Signed)
Pt brought in by EMS, she reports taking all of her medications tonight including sertraline, risperidone, Protonix, buspirone, sucralfate, hydroxyzine, sumatriptan, and ondansetron.  Pt called 911 after taking  Medication asking to speak with a therapist. EMS reports lots of medications still left over in patient's bottles and bubble packs.

## 2020-01-16 ENCOUNTER — Other Ambulatory Visit: Payer: Self-pay

## 2020-01-16 LAB — BASIC METABOLIC PANEL
Anion gap: 12 (ref 5–15)
BUN: 7 mg/dL (ref 6–20)
CO2: 17 mmol/L — ABNORMAL LOW (ref 22–32)
Calcium: 8.1 mg/dL — ABNORMAL LOW (ref 8.9–10.3)
Chloride: 112 mmol/L — ABNORMAL HIGH (ref 98–111)
Creatinine, Ser: 0.84 mg/dL (ref 0.44–1.00)
GFR, Estimated: >60 mL/min (ref >60)
Glucose, Bld: 114 mg/dL — ABNORMAL HIGH (ref 70–99)
Potassium: 3.8 mmol/L (ref 3.5–5.1)
Sodium: 141 mmol/L (ref 135–145)

## 2020-01-16 LAB — COMPREHENSIVE METABOLIC PANEL
ALT: 20 U/L (ref 0–44)
AST: 20 U/L (ref 15–41)
Albumin: 4.6 g/dL (ref 3.5–5.0)
Alkaline Phosphatase: 66 U/L (ref 38–126)
Anion gap: 12 (ref 5–15)
BUN: 7 mg/dL (ref 6–20)
CO2: 21 mmol/L — ABNORMAL LOW (ref 22–32)
Calcium: 9.6 mg/dL (ref 8.9–10.3)
Chloride: 107 mmol/L (ref 98–111)
Creatinine, Ser: 0.78 mg/dL (ref 0.44–1.00)
GFR, Estimated: >60 mL/min (ref >60)
Glucose, Bld: 101 mg/dL — ABNORMAL HIGH (ref 70–99)
Potassium: 3.6 mmol/L (ref 3.5–5.1)
Sodium: 140 mmol/L (ref 135–145)
Total Bilirubin: 0.6 mg/dL (ref 0.3–1.2)
Total Protein: 7.9 g/dL (ref 6.5–8.1)

## 2020-01-16 LAB — RAPID URINE DRUG SCREEN, HOSP PERFORMED
Amphetamines: NOT DETECTED
Barbiturates: NOT DETECTED
Benzodiazepines: NOT DETECTED
Cocaine: NOT DETECTED
Opiates: NOT DETECTED
Tetrahydrocannabinol: NOT DETECTED

## 2020-01-16 LAB — RESP PANEL BY RT-PCR (FLU A&B, COVID) ARPGX2
Influenza A by PCR: NEGATIVE
Influenza B by PCR: NEGATIVE
SARS Coronavirus 2 by RT PCR: NEGATIVE

## 2020-01-16 LAB — I-STAT BETA HCG BLOOD, ED (MC, WL, AP ONLY): I-stat hCG, quantitative: <5 m[IU]/mL (ref <5)

## 2020-01-16 LAB — ETHANOL: Alcohol, Ethyl (B): <10 mg/dL (ref <10)

## 2020-01-16 LAB — MAGNESIUM
Magnesium: 1.9 mg/dL (ref 1.7–2.4)
Magnesium: 1.9 mg/dL (ref 1.7–2.4)

## 2020-01-16 LAB — SALICYLATE LEVEL: Salicylate Lvl: <7 mg/dL — ABNORMAL LOW (ref 7.0–30.0)

## 2020-01-16 LAB — ACETAMINOPHEN LEVEL: Acetaminophen (Tylenol), Serum: <10 ug/mL — ABNORMAL LOW (ref 10–30)

## 2020-01-16 MED ORDER — TRAZODONE HCL 100 MG PO TABS
100.0000 mg | ORAL_TABLET | Freq: Once | ORAL | Status: AC
  Administered 2020-01-16: 100 mg via ORAL
  Filled 2020-01-16: qty 1

## 2020-01-16 MED ORDER — SODIUM CHLORIDE 0.9 % IV BOLUS
1000.0000 mL | Freq: Once | INTRAVENOUS | Status: AC
  Administered 2020-01-16: 02:00:00 1000 mL via INTRAVENOUS

## 2020-01-16 MED ORDER — FLUOXETINE HCL 20 MG PO CAPS
20.0000 mg | ORAL_CAPSULE | Freq: Every day | ORAL | Status: DC
  Administered 2020-01-16 – 2020-01-17 (×2): 20 mg via ORAL
  Filled 2020-01-16 (×2): qty 1

## 2020-01-16 MED ORDER — MAGNESIUM SULFATE 50 % IJ SOLN
1.0000 g | Freq: Once | INTRAMUSCULAR | Status: DC
Start: 2020-01-16 — End: 2020-01-16

## 2020-01-16 MED ORDER — POTASSIUM CHLORIDE CRYS ER 20 MEQ PO TBCR
40.0000 meq | EXTENDED_RELEASE_TABLET | Freq: Once | ORAL | Status: DC
  Filled 2020-01-16: qty 2

## 2020-01-16 MED ORDER — MAGNESIUM SULFATE IN D5W 1-5 GM/100ML-% IV SOLN
1.0000 g | Freq: Once | INTRAVENOUS | Status: AC
  Administered 2020-01-16: 02:00:00 1 g via INTRAVENOUS
  Filled 2020-01-16: qty 100

## 2020-01-16 MED ORDER — POTASSIUM CHLORIDE 10 MEQ/100ML IV SOLN
10.0000 meq | Freq: Once | INTRAVENOUS | Status: AC
  Administered 2020-01-16: 02:00:00 10 meq via INTRAVENOUS
  Filled 2020-01-16: qty 100

## 2020-01-16 MED ORDER — SODIUM CHLORIDE 0.9 % IV BOLUS
1000.0000 mL | Freq: Once | INTRAVENOUS | Status: AC
  Administered 2020-01-16: 03:00:00 1000 mL via INTRAVENOUS

## 2020-01-16 NOTE — BH Assessment (Addendum)
[facility] Assessment Progress Note  Case was staffed with Jerelene Redden NP who recommended a inpatient admission for stabilization.

## 2020-01-16 NOTE — ED Notes (Signed)
Patient received breakfast tray 

## 2020-01-16 NOTE — ED Notes (Signed)
Patient calmly laying on stretcher  at this time.

## 2020-01-16 NOTE — ED Provider Notes (Signed)
9:40 AM-she is alert and has been cooperative.  She has mild tachycardia on cardiac monitor, likely secondary to the polypharmaceutical intentional drug overdose.  She has been medically cleared.  It is suspect the tachycardia will gradually improve and can be treated symptomatically at this time.  The patient has not yet been involuntarily committed.  I will proceed with that.  She appears to have inappropriately attempted to kill herself with a polypharmaceutical overdose.  She will require psychiatric hospitalization and stabilization.   Daleen Bo, MD 01/16/20 [phone]

## 2020-01-16 NOTE — ED Notes (Signed)
Patient gets off of stretcher and starts pacing back and forth stating "Im leaving". Pt informed she is IVCd. Security called to bedside.

## 2020-01-16 NOTE — ED Notes (Addendum)
Patient stating "Im ready to leave". MD made aware.
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
## 2020-01-17 ENCOUNTER — Inpatient Hospital Stay (HOSPITAL_COMMUNITY)
Admission: AD | Admit: 2020-01-17 | Discharge: 2020-01-22 | DRG: 885 | Disposition: A | Discharge status: Home / Self Care | Payer: PPO | Source: Intra-hospital | Attending: Psychiatry | Admitting: Psychiatry

## 2020-01-17 ENCOUNTER — Encounter (HOSPITAL_COMMUNITY): Payer: Self-pay | Admitting: Psychiatry

## 2020-01-17 DIAGNOSIS — T471X2D Poisoning by other antacids and anti-gastric-secretion drugs, intentional self-harm, subsequent encounter: Secondary | ICD-10-CM

## 2020-01-17 DIAGNOSIS — Z91013 Allergy to seafood: Secondary | ICD-10-CM | POA: Diagnosis not present

## 2020-01-17 DIAGNOSIS — R45851 Suicidal ideations: Secondary | ICD-10-CM

## 2020-01-17 DIAGNOSIS — T50901A Poisoning by unspecified drugs, medicaments and biological substances, accidental (unintentional), initial encounter: Secondary | ICD-10-CM | POA: Diagnosis present

## 2020-01-17 DIAGNOSIS — F250 Schizoaffective disorder, bipolar type: Principal | ICD-10-CM | POA: Diagnosis present

## 2020-01-17 DIAGNOSIS — Z820 Family history of epilepsy and other diseases of the nervous system: Secondary | ICD-10-CM | POA: Diagnosis not present

## 2020-01-17 DIAGNOSIS — Z882 Allergy status to sulfonamides status: Secondary | ICD-10-CM | POA: Diagnosis not present

## 2020-01-17 DIAGNOSIS — K219 Gastro-esophageal reflux disease without esophagitis: Secondary | ICD-10-CM | POA: Diagnosis not present

## 2020-01-17 DIAGNOSIS — Z148 Genetic carrier of other disease: Secondary | ICD-10-CM | POA: Diagnosis not present

## 2020-01-17 DIAGNOSIS — T43222D Poisoning by selective serotonin reuptake inhibitors, intentional self-harm, subsequent encounter: Secondary | ICD-10-CM

## 2020-01-17 DIAGNOSIS — Z818 Family history of other mental and behavioral disorders: Secondary | ICD-10-CM

## 2020-01-17 DIAGNOSIS — T43592D Poisoning by other antipsychotics and neuroleptics, intentional self-harm, subsequent encounter: Secondary | ICD-10-CM

## 2020-01-17 DIAGNOSIS — T50902A Poisoning by unspecified drugs, medicaments and biological substances, intentional self-harm, initial encounter: Secondary | ICD-10-CM

## 2020-01-17 DIAGNOSIS — Z79899 Other long term (current) drug therapy: Secondary | ICD-10-CM | POA: Diagnosis not present

## 2020-01-17 DIAGNOSIS — Z91018 Allergy to other foods: Secondary | ICD-10-CM

## 2020-01-17 DIAGNOSIS — J45909 Unspecified asthma, uncomplicated: Secondary | ICD-10-CM | POA: Diagnosis not present

## 2020-01-17 DIAGNOSIS — T398X2D Poisoning by other nonopioid analgesics and antipyretics, not elsewhere classified, intentional self-harm, subsequent encounter: Secondary | ICD-10-CM

## 2020-01-17 DIAGNOSIS — F4310 Post-traumatic stress disorder, unspecified: Secondary | ICD-10-CM | POA: Diagnosis not present

## 2020-01-17 DIAGNOSIS — G43909 Migraine, unspecified, not intractable, without status migrainosus: Secondary | ICD-10-CM | POA: Diagnosis not present

## 2020-01-17 DIAGNOSIS — Z888 Allergy status to other drugs, medicaments and biological substances status: Secondary | ICD-10-CM | POA: Diagnosis not present

## 2020-01-17 DIAGNOSIS — J4520 Mild intermittent asthma, uncomplicated: Secondary | ICD-10-CM

## 2020-01-17 DIAGNOSIS — T450X2D Poisoning by antiallergic and antiemetic drugs, intentional self-harm, subsequent encounter: Secondary | ICD-10-CM

## 2020-01-17 DIAGNOSIS — F840 Autistic disorder: Secondary | ICD-10-CM | POA: Diagnosis present

## 2020-01-17 DIAGNOSIS — F603 Borderline personality disorder: Secondary | ICD-10-CM | POA: Diagnosis present

## 2020-01-17 DIAGNOSIS — F845 Asperger's syndrome: Secondary | ICD-10-CM | POA: Diagnosis present

## 2020-01-17 DIAGNOSIS — G40909 Epilepsy, unspecified, not intractable, without status epilepticus: Secondary | ICD-10-CM | POA: Diagnosis not present

## 2020-01-17 DIAGNOSIS — Z825 Family history of asthma and other chronic lower respiratory diseases: Secondary | ICD-10-CM | POA: Diagnosis not present

## 2020-01-17 DIAGNOSIS — Z91030 Bee allergy status: Secondary | ICD-10-CM | POA: Diagnosis not present

## 2020-01-17 DIAGNOSIS — F332 Major depressive disorder, recurrent severe without psychotic features: Secondary | ICD-10-CM | POA: Diagnosis present

## 2020-01-17 MED ORDER — RISPERIDONE 3 MG PO TABS
3.0000 mg | ORAL_TABLET | Freq: Every day | ORAL | Status: DC
  Administered 2020-01-17 – 2020-01-19 (×3): 3 mg via ORAL
  Filled 2020-01-17 (×4): qty 1

## 2020-01-17 MED ORDER — BUSPIRONE HCL 10 MG PO TABS
10.0000 mg | ORAL_TABLET | Freq: Three times a day (TID) | ORAL | Status: DC
Start: 2020-01-17 — End: 2020-01-20
  Administered 2020-01-17 – 2020-01-20 (×10): 10 mg via ORAL
  Filled 2020-01-17: qty 1
  Filled 2020-01-17: qty 2
  Filled 2020-01-17 (×11): qty 1
  Filled 2020-01-17: qty 2

## 2020-01-17 MED ORDER — RISPERIDONE 2 MG PO TABS
2.0000 mg | ORAL_TABLET | Freq: Every day | ORAL | Status: DC
  Administered 2020-01-18 – 2020-01-20 (×3): 2 mg via ORAL
  Filled 2020-01-17 (×5): qty 1

## 2020-01-17 MED ORDER — BENZOCAINE 10 % MT GEL
Freq: Four times a day (QID) | OROMUCOSAL | Status: DC | PRN
  Filled 2020-01-17: qty 9.4

## 2020-01-17 MED ORDER — FLUOXETINE HCL 20 MG PO CAPS
20.0000 mg | ORAL_CAPSULE | Freq: Every day | ORAL | Status: DC
  Filled 2020-01-17 (×3): qty 1

## 2020-01-17 MED ORDER — ACETAMINOPHEN 325 MG PO TABS: 650.0000 mg | ORAL_TABLET | Freq: Four times a day (QID) | ORAL | Status: DC | PRN

## 2020-01-17 MED ORDER — TRAZODONE HCL 100 MG PO TABS
200.0000 mg | ORAL_TABLET | Freq: Every day | ORAL | Status: DC
  Administered 2020-01-17 – 2020-01-21 (×5): 200 mg via ORAL
  Filled 2020-01-17 (×8): qty 2

## 2020-01-17 MED ORDER — ONDANSETRON HCL 4 MG PO TABS
8.0000 mg | ORAL_TABLET | Freq: Three times a day (TID) | ORAL | Status: DC | PRN
  Administered 2020-01-19 – 2020-01-22 (×4): 8 mg via ORAL
  Filled 2020-01-17 (×4): qty 2

## 2020-01-17 MED ORDER — MOMETASONE FURO-FORMOTEROL FUM 200-5 MCG/ACT IN AERO
2.0000 | INHALATION_SPRAY | Freq: Two times a day (BID) | RESPIRATORY_TRACT | Status: DC
  Administered 2020-01-17 – 2020-01-22 (×9): 2 via RESPIRATORY_TRACT
  Filled 2020-01-17 (×2): qty 8.8

## 2020-01-17 MED ORDER — RISPERIDONE 2 MG PO TABS
2.0000 mg | ORAL_TABLET | Freq: Every day | ORAL | Status: DC
  Filled 2020-01-17 (×3): qty 1

## 2020-01-17 MED ORDER — HYDROXYZINE HCL 25 MG PO TABS
25.0000 mg | ORAL_TABLET | Freq: Three times a day (TID) | ORAL | Status: DC | PRN
  Administered 2020-01-17 – 2020-01-21 (×10): 25 mg via ORAL
  Filled 2020-01-17 (×10): qty 1

## 2020-01-17 MED ORDER — ALBUTEROL SULFATE HFA 108 (90 BASE) MCG/ACT IN AERS
1.0000 | INHALATION_SPRAY | Freq: Four times a day (QID) | RESPIRATORY_TRACT | Status: DC | PRN
  Administered 2020-01-19 – 2020-01-21 (×2): 2 via RESPIRATORY_TRACT
  Filled 2020-01-17: qty 6.7

## 2020-01-17 MED ORDER — SERTRALINE HCL 50 MG PO TABS
150.0000 mg | ORAL_TABLET | Freq: Every day | ORAL | Status: DC
  Filled 2020-01-17 (×2): qty 1

## 2020-01-17 MED ORDER — BUSPIRONE HCL 10 MG PO TABS
10.0000 mg | ORAL_TABLET | Freq: Once | ORAL | Status: AC
  Filled 2020-01-17: qty 1

## 2020-01-17 MED ORDER — SUMATRIPTAN SUCCINATE 50 MG PO TABS
100.0000 mg | ORAL_TABLET | ORAL | Status: DC | PRN
  Administered 2020-01-18 – 2020-01-20 (×3): 100 mg via ORAL
  Filled 2020-01-17 (×3): qty 2

## 2020-01-17 MED ORDER — FLUOXETINE HCL 20 MG PO CAPS
20.0000 mg | ORAL_CAPSULE | Freq: Every day | ORAL | Status: DC
  Administered 2020-01-18 – 2020-01-22 (×5): 20 mg via ORAL
  Filled 2020-01-17 (×6): qty 1

## 2020-01-17 MED ORDER — LORAZEPAM 1 MG PO TABS
2.0000 mg | ORAL_TABLET | Freq: Once | ORAL | Status: AC
  Administered 2020-01-17: 02:00:00 2 mg via ORAL
  Filled 2020-01-17: qty 2

## 2020-01-17 MED ORDER — BENZOCAINE 10 % MT GEL
Freq: Four times a day (QID) | OROMUCOSAL | Status: DC | PRN
  Administered 2020-01-17: 1 via OROMUCOSAL
  Filled 2020-01-17: qty 9

## 2020-01-17 MED ORDER — PANTOPRAZOLE SODIUM 40 MG PO TBEC
40.0000 mg | DELAYED_RELEASE_TABLET | Freq: Every day | ORAL | Status: DC
  Administered 2020-01-17 – 2020-01-22 (×6): 40 mg via ORAL
  Filled 2020-01-17 (×9): qty 1

## 2020-01-17 MED ORDER — ALUM & MAG HYDROXIDE-SIMETH 200-200-20 MG/5ML PO SUSP: 30.0000 mL | Freq: Four times a day (QID) | ORAL | Status: DC | PRN

## 2020-01-17 MED ORDER — MAGNESIUM HYDROXIDE 400 MG/5ML PO SUSP: 30.0000 mL | Freq: Every day | ORAL | Status: DC | PRN

## 2020-01-17 MED ORDER — FLUOXETINE HCL 20 MG PO CAPS
20.0000 mg | ORAL_CAPSULE | Freq: Every day | ORAL | Status: DC
  Filled 2020-01-17 (×2): qty 1

## 2020-01-17 NOTE — ED Notes (Signed)
Pt ambulatory to restroom
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
## 2020-01-17 NOTE — Progress Notes (Signed)
   01/17/20 2154  COVID-19 Daily Checkoff  Have you had a fever (temp > 37.80C/100F)  in the past 24 hours?  No  If you have had runny nose, nasal congestion, sneezing in the past 24 hours, has it worsened? No  COVID-19 EXPOSURE  Have you traveled outside the state in the past 14 days? No  Have you been in contact with someone with a confirmed diagnosis of COVID-19 or PUI in the past 14 days without wearing appropriate PPE? No  Have you been living in the same home as a person with confirmed diagnosis of COVID-19 or a PUI (household contact)? No  Have you been diagnosed with COVID-19? No

## 2020-01-17 NOTE — BHH Counselor (Signed)
Clinical Social Work Note  CSW attempted to meet with patient to do Psychosocial Assessment, but she was sleeping soundly and could not be awakened.  CSW team will continue attempts.  Selmer Dominion, LCSW 01/17/2020, 2:03 PM
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
## 2020-01-17 NOTE — ED Notes (Signed)
Called [facility] to cancel her ride to church on wednesday

## 2020-01-17 NOTE — Tx Team (Signed)
Initial Treatment Plan 01/17/2020 12:49 PM [name] MQT:[id number]    PATIENT STRESSORS: Financial difficulties Health problems Medication change or noncompliance   PATIENT STRENGTHS: Communication skills Motivation for treatment/growth Physical Health Supportive family/friends   PATIENT IDENTIFIED PROBLEMS: anxiety  depression  Intentional overdose                 DISCHARGE CRITERIA:  Ability to meet basic life and health needs Improved stabilization in mood, thinking, and/or behavior Medical problems require only outpatient monitoring Motivation to continue treatment in a less acute level of care  PRELIMINARY DISCHARGE PLAN: Attend aftercare/continuing care group Return to previous living arrangement  PATIENT/FAMILY INVOLVEMENT: This treatment plan has been presented to and reviewed with the patient, [name].  The patient and family have been given the opportunity to ask questions and make suggestions.  Baron Sane, RN 01/17/2020, 12:49 PM

## 2020-01-17 NOTE — Progress Notes (Signed)
   01/17/20 2216  Psych Admission Type (Psych Patients Only)  Admission Status Involuntary  Psychosocial Assessment  Patient Complaints Anxiety;Depression  Eye Contact Brief  Facial Expression Flat  Affect Appropriate to circumstance  Speech Logical/coherent  Interaction Minimal  Motor Activity Other (Comment) (WDL)  Appearance/Hygiene Disheveled;In scrubs;Poor hygiene  Behavior Characteristics Appropriate to situation  Mood Depressed  Thought Process  Coherency WDL  Content Blaming others  Delusions None reported or observed  Perception WDL  Hallucination None reported or observed  Judgment Poor  Confusion None  Danger to Self  Current suicidal ideation? Denies  Self-Injurious Behavior No self-injurious ideation or behavior indicators observed or expressed   Agreement Not to Harm Self Yes  Description of Agreement verbal  Danger to Others  Danger to Others None reported or observed

## 2020-01-17 NOTE — ED Notes (Signed)
Called report to [facility] at [facility]

## 2020-01-17 NOTE — Progress Notes (Signed)
EKG results placed on the outside of shadow chart  Normal sinus rhythm Normal ECG  QT/QTc  386/456 ms
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
## 2020-01-18 DIAGNOSIS — F332 Major depressive disorder, recurrent severe without psychotic features: Secondary | ICD-10-CM

## 2020-01-18 LAB — LIPID PANEL
Cholesterol: 173 mg/dL (ref 0–200)
HDL: 49 mg/dL (ref >40)
LDL Cholesterol: 92 mg/dL (ref 0–99)
Total CHOL/HDL Ratio: 3.5 RATIO
Triglycerides: 159 mg/dL — ABNORMAL HIGH (ref <150)
VLDL: 32 mg/dL (ref 0–40)

## 2020-01-18 LAB — TSH: TSH: 2.47 u[IU]/mL (ref 0.350–4.500)

## 2020-01-18 LAB — T4, FREE: Free T4: 0.97 ng/dL (ref 0.61–1.12)

## 2020-01-18 NOTE — Progress Notes (Signed)
Patient is in her bedroom and is crying to herself. She is requesting to speak with her nurse at this time. The nurse has been notified.

## 2020-01-18 NOTE — BHH Suicide Risk Assessment (Signed)
[place] INPATIENT:  Family/Significant Other Suicide Prevention Education  Suicide Prevention Education:  Patient Refusal for Family/Significant Other Suicide Prevention Education: The patient [name] has refused to provide written consent for family/significant other to be provided Family/Significant Other Suicide Prevention Education during admission and/or prior to discharge. Physician notified.  SPE completed with patient, as patient refused to consent to family contact. SPI pamphlet provided to pt and pt was encouraged to share information with support network, ask questions, and talk about any concerns relating to SPE. Patient denies access to guns/firearms and verbalized understanding of information provided. Mobile Crisis information also provided to patient.  Toney Reil, Start Worker [employer]

## 2020-01-18 NOTE — Progress Notes (Signed)
Recreation Therapy Notes  Date:  12.13.21 Time: 0930 Location: 300 Hall Group Room  Group Topic: Stress Management  Goal Area(s) Addresses:  Patient will identify positive stress management techniques. Patient will identify benefits of using stress management post d/c.  Intervention:  Stress Management  Activity: Meditation.  LRT played a meditation that focused on making the most of each moment of the day and the possibilities that could arise from it.    Education:  Stress Management, Discharge Planning.   Education Outcome: Acknowledges Education  Clinical Observations/Feedback: Pt did not attend group activity.    Victorino Sparrow, LRT/CTRS     Victorino Sparrow A 01/18/2020 11:53 AM
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
## 2020-01-18 NOTE — Progress Notes (Signed)
Patient rated her day as a 6 out of 10 due to her anxiety. She states that her day was affected by an argument that she had over the phone with her boyfriend, but she states that she handled it well.
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
## 2020-01-18 NOTE — Tx Team (Signed)
Interdisciplinary Treatment and Diagnostic Plan Update  01/18/2020 Time of Session: 9:00am [name] MRN: [id number]  Principal Diagnosis: MDD (major depressive disorder), recurrent episode, severe ([place])  Secondary Diagnoses: Principal Problem:   MDD (major depressive disorder), recurrent episode, severe ([place]) Active Problems:   Borderline personality disorder ([place])   Autism spectrum disorder   Suicidal ideations   Asthma   Seizure disorder ([place])   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   Overdose   Current Medications:  Current Facility-Administered Medications  Medication Dose Route Frequency Provider Last Rate Last Admin  . acetaminophen (TYLENOL) tablet 650 mg  650 mg Oral Q6H PRN Ethelene Hal, NP      . albuterol (VENTOLIN HFA) 108 (90 Base) MCG/ACT inhaler 1-2 puff  1-2 puff Inhalation Q6H PRN Ethelene Hal, NP      . alum & mag hydroxide-simeth (MAALOX/MYLANTA) 200-200-20 MG/5ML suspension 30 mL  30 mL Oral Q6H PRN Ethelene Hal, NP      . benzocaine (ORAJEL) 10 % mucosal gel   Mouth/Throat QID PRN Ethelene Hal, NP   Given at 01/18/20 [phone]  . busPIRone (BUSPAR) tablet 10 mg  10 mg Oral TID Mallie Darting, NP   10 mg at 01/18/20 0748  . FLUoxetine (PROZAC) capsule 20 mg  20 mg Oral Daily Ethelene Hal, NP   20 mg at 01/18/20 0747  . hydrOXYzine (ATARAX/VISTARIL) tablet 25 mg  25 mg Oral TID PRN Mallie Darting, NP   25 mg at 01/18/20 2863  . magnesium hydroxide (MILK OF MAGNESIA) suspension 30 mL  30 mL Oral Daily PRN Ethelene Hal, NP      . mometasone-formoterol [facility]) 200-5 MCG/ACT inhaler 2 puff  2 puff Inhalation BID Ethelene Hal, NP   2 puff at 01/18/20 0749  . ondansetron (ZOFRAN) tablet 8 mg  8 mg Oral Q8H PRN Ethelene Hal, NP      . pantoprazole (PROTONIX) EC tablet 40 mg  40 mg Oral Daily Ethelene Hal, NP   40 mg at 01/18/20 0750  . risperiDONE (RISPERDAL) tablet 2 mg  2 mg Oral Daily  Ethelene Hal, NP   2 mg at 01/18/20 0751  . risperiDONE (RISPERDAL) tablet 3 mg  3 mg Oral QHS Merlyn Lot E, NP   3 mg at 01/17/20 2332  . SUMAtriptan (IMITREX) tablet 100 mg  100 mg Oral Q2H PRN Ethelene Hal, NP   100 mg at 01/18/20 8177  . traZODone (DESYREL) tablet 200 mg  200 mg Oral QHS Ethelene Hal, NP   200 mg at 01/17/20 2332   PTA Medications: Medications Prior to Admission  Medication Sig Dispense Refill Last Dose  . albuterol (PROVENTIL HFA;VENTOLIN HFA) 108 (90 Base) MCG/ACT inhaler Inhale 1-2 puffs into the lungs every 6 (six) hours as needed for wheezing or shortness of breath.     . busPIRone (BUSPAR) 10 MG tablet Take 1 tablet (10 mg total) by mouth 3 (three) times daily. 90 tablet 0   . fluticasone-salmeterol (ADVAIR HFA) 115-21 MCG/ACT inhaler Inhale 2 puffs into the lungs 2 (two) times daily. (Patient not taking: No sig reported) 1 Inhaler 5   . hydrOXYzine (ATARAX/VISTARIL) 25 MG tablet Take 1 tablet (25 mg total) by mouth every 6 (six) hours as needed for anxiety. (Patient not taking: Reported on 01/14/2020) 30 tablet 0   . ondansetron (ZOFRAN) 4 MG tablet Take 1 tablet (4 mg total) by mouth every 8 (eight) hours  as needed for nausea or vomiting. 12 tablet 0   . pantoprazole (PROTONIX) 40 MG tablet Take 1 tablet (40 mg total) by mouth daily. 30 tablet 0   . risperiDONE (RISPERDAL) 2 MG tablet Take 1 tablet (2 mg total) by mouth in the morning. 30 tablet 0   . risperiDONE (RISPERDAL) 3 MG tablet Take 1 tablet (3 mg total) by mouth at bedtime. 30 tablet 0   . sertraline (ZOLOFT) 50 MG tablet Take 3 tablets (150 mg total) by mouth daily. 90 tablet 0   . sucralfate (CARAFATE) 1 g tablet Take 1 g by mouth 4 (four) times daily.     . traZODone (DESYREL) 100 MG tablet Take 2 tablets (200 mg total) by mouth at bedtime as needed for sleep. 60 tablet 0     Patient Stressors: Financial difficulties Health problems Medication change or  noncompliance  Patient Strengths: Agricultural engineer for treatment/growth Physical Health Supportive family/friends  Treatment Modalities: Medication Management, Group therapy, Case management,  1 to 1 session with clinician, Psychoeducation, Recreational therapy.   Physician Treatment Plan for Primary Diagnosis: MDD (major depressive disorder), recurrent episode, severe ([place]) Long Term Goal(s): Improvement in symptoms so as ready for discharge Improvement in symptoms so as ready for discharge   Short Term Goals: Ability to identify changes in lifestyle to reduce recurrence of condition will improve Ability to disclose and discuss suicidal ideas Ability to identify and develop effective coping behaviors will improve Compliance with prescribed medications will improve Ability to identify triggers associated with substance abuse/mental health issues will improve Ability to identify changes in lifestyle to reduce recurrence of condition will improve Ability to verbalize feelings will improve Ability to disclose and discuss suicidal ideas Ability to identify and develop effective coping behaviors will improve Compliance with prescribed medications will improve Ability to identify triggers associated with substance abuse/mental health issues will improve  Medication Management: Evaluate patient's response, side effects, and tolerance of medication regimen.  Therapeutic Interventions: 1 to 1 sessions, Unit Group sessions and Medication administration.  Evaluation of Outcomes: Not Met  Physician Treatment Plan for Secondary Diagnosis: Principal Problem:   MDD (major depressive disorder), recurrent episode, severe ([place]) Active Problems:   Borderline personality disorder ([place])   Autism spectrum disorder   Suicidal ideations   Asthma   Seizure disorder ([place])   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   Overdose  Long Term Goal(s): Improvement in symptoms so as ready for  discharge Improvement in symptoms so as ready for discharge   Short Term Goals: Ability to identify changes in lifestyle to reduce recurrence of condition will improve Ability to disclose and discuss suicidal ideas Ability to identify and develop effective coping behaviors will improve Compliance with prescribed medications will improve Ability to identify triggers associated with substance abuse/mental health issues will improve Ability to identify changes in lifestyle to reduce recurrence of condition will improve Ability to verbalize feelings will improve Ability to disclose and discuss suicidal ideas Ability to identify and develop effective coping behaviors will improve Compliance with prescribed medications will improve Ability to identify triggers associated with substance abuse/mental health issues will improve     Medication Management: Evaluate patient's response, side effects, and tolerance of medication regimen.  Therapeutic Interventions: 1 to 1 sessions, Unit Group sessions and Medication administration.  Evaluation of Outcomes: Not Met   RN Treatment Plan for Primary Diagnosis: MDD (major depressive disorder), recurrent episode, severe ([place]) Long Term Goal(s): Knowledge of disease and therapeutic regimen to maintain  health will improve  Short Term Goals: Ability to remain free from injury will improve, Ability to participate in decision making will improve, Ability to verbalize feelings will improve, Ability to disclose and discuss suicidal ideas and Ability to identify and develop effective coping behaviors will improve  Medication Management: RN will administer medications as ordered by provider, will assess and evaluate patient's response and provide education to patient for prescribed medication. RN will report any adverse and/or side effects to prescribing provider.  Therapeutic Interventions: 1 on 1 counseling sessions, Psychoeducation, Medication administration,  Evaluate responses to treatment, Monitor vital signs and CBGs as ordered, Perform/monitor CIWA, COWS, AIMS and Fall Risk screenings as ordered, Perform wound care treatments as ordered.  Evaluation of Outcomes: Not Met   LCSW Treatment Plan for Primary Diagnosis: MDD (major depressive disorder), recurrent episode, severe ([place]) Long Term Goal(s): Safe transition to appropriate next level of care at discharge, Engage patient in therapeutic group addressing interpersonal concerns.  Short Term Goals: Engage patient in aftercare planning with referrals and resources, Increase social support, Increase emotional regulation, Facilitate acceptance of mental health diagnosis and concerns, Identify triggers associated with mental health/substance abuse issues and Increase skills for wellness and recovery  Therapeutic Interventions: Assess for all discharge needs, 1 to 1 time with Social worker, Explore available resources and support systems, Assess for adequacy in community support network, Educate family and significant other(s) on suicide prevention, Complete Psychosocial Assessment, Interpersonal group therapy.  Evaluation of Outcomes: Not Met   Progress in Treatment: Attending groups: No. Participating in groups: No. Taking medication as prescribed: Yes. Toleration medication: Yes. Family/Significant other contact made: No, will contact:  [place]  Patient understands diagnosis: Yes. and No. Discussing patient identified problems/goals with staff: Yes. Medical problems stabilized or resolved: Yes. Denies suicidal/homicidal ideation: Yes. Issues/concerns per patient self-inventory: No.   New problem(s) identified: No, Describe:  None   New Short Term/Long Term Goal(s): medication stabilization, elimination of SI thoughts, development of comprehensive mental wellness plan.   Patient Goals:  Did not attend  Discharge Plan or Barriers: Patient recently admitted. CSW will continue to  follow and assess for appropriate referrals and possible discharge planning.   Reason for Continuation of Hospitalization: Depression Medication stabilization Suicidal ideation  Estimated Length of Stay: 3 to 5 days   Attendees: Patient: Did not attend 01/18/2020   Physician: Lala Lund, MD 01/18/2020   Nursing:  01/18/2020   RN Care Manager: 01/18/2020   Social Worker: Verdis Frederickson, [place] 01/18/2020   Recreational Therapist:  01/18/2020   Other:  01/18/2020  Other:  01/18/2020   Other: 01/18/2020     Scribe for Treatment Team: Darleen Crocker, LCSWA 01/18/2020 10:50 AM
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
## 2020-01-18 NOTE — Plan of Care (Signed)
Nurse discussed anxiety, depression and coping skills with patient.  

## 2020-01-18 NOTE — Progress Notes (Signed)
[facility] MD Progress Note  01/18/2020 11:27 AM [name]  MRN:  [id number]  Principal Problem: MDD (major depressive disorder), recurrent episode, severe ([place]) Diagnosis: Principal Problem:   MDD (major depressive disorder), recurrent episode, severe ([place]) Active Problems:   Borderline personality disorder ([name])   Autism spectrum disorder   Suicidal ideations   Asthma   Seizure disorder ([place])   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   Overdose  Total Time spent with patient: 30 minutes  Daily Note:   Patient seen, chart reviewed, case discussed with treatment team.  Patient endorses a complaint of sleep and a fair appetite.  She still reports low mood and occasional thoughts of self-harm, although reports that these are starting to improve given her time here to rest, group therapy she has received, as well as a restarting of her medications.  A discussion was held with patient about ways to avoid future situations including more to avoid becoming so upset from similar situations.   She is compliant with medications without any side effects reported or observed.  She is agreeable to continue treatment for now.       Past Medical History:  Past Medical History:  Diagnosis Date  . Acid reflux   . Anxiety   . Asthma    last attack 03/13/15 or 03/14/15  . Autism   . 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 Chronic constipation   . Depression   . Drug-seeking behavior   . Essential tremor   . Headache   . Overdose of acetaminophen 07/2017   and other meds  . Personality disorder ([place])   . Schizo-affective psychosis ([place])   . Schizoaffective disorder, bipolar type ([place])   . Seizures [facility])    Last seizure December 2017  . Sleep apnea     Past Surgical History:  Procedure Laterality Date  . MOUTH SURGERY  2009 or 2010   Family History:  Family History  Problem Relation Age of Onset  . Mental illness Father   . Asthma Father   . PDD Brother   . Seizures Brother      Social History:  Social History   Substance and Sexual Activity  Alcohol Use No  . Alcohol/week: 1.0 standard drink  . Types: 1 Standard drinks or equivalent per week   Comment: denies at this time     Social History   Substance and Sexual Activity  Drug Use No   Comment: History of cocaine use at age 38 for 4 months    Social History   Socioeconomic History  . Marital status: Widowed    Spouse name: Not on file  . Number of children: 0  . Years of education: Not on file  . Highest education level: Not on file  Occupational History  . Occupation: disability  Tobacco Use  . Smoking status: Former Smoker    Packs/day: 0.00  . Smokeless tobacco: Never Used  . Tobacco comment: Smoked for 2  years age [age]  Vaping Use  . Vaping Use: Never used  Substance and Sexual Activity  . Alcohol use: No    Alcohol/week: 1.0 standard drink    Types: 1 Standard drinks or equivalent per week    Comment: denies at this time  . Drug use: No    Comment: History of cocaine use at age 35 for 4 months  . Sexual activity: Yes    Birth control/protection: None  Other Topics Concern  . Not on file  Social  History Narrative   Marital status: Widowed      Children: daughter      Lives: with boyfriend, in two story home      Employment:  Disability      Tobacco: quit smoking; smoked for two years.      Alcohol ;none      Drugs: none   Has not traveled outside of the country.   Right handed         Social Determinants of Health   Financial Resource Strain: Not on file  Food Insecurity: Not on file  Transportation Needs: Not on file  Physical Activity: Not on file  Stress: Not on file  Social Connections: Not on file   Additional Social History:                         Sleep: Fair  Appetite:  Fair  Current Medications: Current Facility-Administered Medications  Medication Dose Route Frequency Provider Last Rate Last Admin  . acetaminophen (TYLENOL) tablet 650  mg  650 mg Oral Q6H PRN Ethelene Hal, NP      . albuterol (VENTOLIN HFA) 108 (90 Base) MCG/ACT inhaler 1-2 puff  1-2 puff Inhalation Q6H PRN Ethelene Hal, NP      . alum & mag hydroxide-simeth (MAALOX/MYLANTA) 200-200-20 MG/5ML suspension 30 mL  30 mL Oral Q6H PRN Ethelene Hal, NP      . benzocaine (ORAJEL) 10 % mucosal gel   Mouth/Throat QID PRN Ethelene Hal, NP   Given at 01/18/20 [phone]  . busPIRone (BUSPAR) tablet 10 mg  10 mg Oral TID Mallie Darting, NP   10 mg at 01/18/20 0748  . FLUoxetine (PROZAC) capsule 20 mg  20 mg Oral Daily Ethelene Hal, NP   20 mg at 01/18/20 0747  . hydrOXYzine (ATARAX/VISTARIL) tablet 25 mg  25 mg Oral TID PRN Mallie Darting, NP   25 mg at 01/18/20 1517  . magnesium hydroxide (MILK OF MAGNESIA) suspension 30 mL  30 mL Oral Daily PRN Ethelene Hal, NP      . mometasone-formoterol [facility]) 200-5 MCG/ACT inhaler 2 puff  2 puff Inhalation BID Ethelene Hal, NP   2 puff at 01/18/20 0749  . ondansetron (ZOFRAN) tablet 8 mg  8 mg Oral Q8H PRN Ethelene Hal, NP      . pantoprazole (PROTONIX) EC tablet 40 mg  40 mg Oral Daily Ethelene Hal, NP   40 mg at 01/18/20 0750  . risperiDONE (RISPERDAL) tablet 2 mg  2 mg Oral Daily Ethelene Hal, NP   2 mg at 01/18/20 0751  . risperiDONE (RISPERDAL) tablet 3 mg  3 mg Oral QHS Merlyn Lot E, NP   3 mg at 01/17/20 2332  . SUMAtriptan (IMITREX) tablet 100 mg  100 mg Oral Q2H PRN Ethelene Hal, NP   100 mg at 01/18/20 6160  . traZODone (DESYREL) tablet 200 mg  200 mg Oral QHS Ethelene Hal, NP   200 mg at 01/17/20 2332    Lab Results:  Results for orders placed or performed during the hospital encounter of 01/17/20 (from the past 48 hour(s))  Lipid panel     Status: Abnormal   Collection Time: 01/18/20  6:44 AM  Result Value Ref Range   Cholesterol 173 0 - 200 mg/dL   Triglycerides 159 (H) <150 mg/dL   HDL 49 >40 mg/dL   Total  CHOL/HDL Ratio 3.5  RATIO   VLDL 32 0 - 40 mg/dL   LDL Cholesterol 92 0 - 99 mg/dL    Comment:        Total Cholesterol/HDL:CHD Risk Coronary Heart Disease Risk Table                     Men   Women  1/2 Average Risk   3.4   3.3  Average Risk       5.0   4.4  2 X Average Risk   9.6   7.1  3 X Average Risk  23.4   11.0        Use the calculated Patient Ratio above and the CHD Risk Table to determine the patient's CHD Risk.        ATP III CLASSIFICATION (LDL):  <100     mg/dL   Optimal  100-129  mg/dL   Near or Above                    Optimal  130-159  mg/dL   Borderline  160-189  mg/dL   High  >190     mg/dL   Very High Performed at [place] [address] [place], [place] [address]   T4, free     Status: None   Collection Time: 01/18/20  6:44 AM  Result Value Ref Range   Free T4 0.97 0.61 - 1.12 ng/dL    Comment: (NOTE) Biotin ingestion may interfere with free T4 tests. If the results are inconsistent with the TSH level, previous test results, or the clinical presentation, then consider biotin interference. If needed, order repeat testing after stopping biotin. Performed at [facility], [place] [address], [place], [place] [address]   TSH     Status: None   Collection Time: 01/18/20  6:44 AM  Result Value Ref Range   TSH 2.470 0.350 - 4.500 uIU/mL    Comment: Performed by a 3rd Generation assay with a functional sensitivity of <=0.01 uIU/mL. Performed at [facility], [place] [address] [place], [place] [address]     Blood Alcohol level:  Lab Results  Component Value Date   [facility] <10 01/15/2020   ETH <10 25/42/7062    Metabolic Disorder Labs: Lab Results  Component Value Date   HGBA1C 5.0 07/02/2019   MPG 96.8 07/02/2019   MPG 91.06 03/06/2019   Lab Results  Component Value Date   PROLACTIN 129.0 (H) 07/25/2017   PROLACTIN 194.7 (H) 11/22/2016   Lab Results  Component Value Date   CHOL 173 01/18/2020   TRIG  159 (H) 01/18/2020   HDL 49 01/18/2020   CHOLHDL 3.5 01/18/2020   VLDL 32 01/18/2020   LDLCALC 92 01/18/2020   LDLCALC 122 (H) 07/02/2019    Physical Findings: AIMS: Facial and Oral Movements Muscles of Facial Expression: None, normal Lips and Perioral Area: None, normal Jaw: None, normal Tongue: None, normal,Extremity Movements Upper (arms, wrists, hands, fingers): None, normal Lower (legs, knees, ankles, toes): None, normal, Trunk Movements Neck, shoulders, hips: None, normal, Overall Severity Severity of abnormal movements (highest score from questions above): None, normal Incapacitation due to abnormal movements: None, normal Patient's awareness of abnormal movements (rate only patient's report): No Awareness, Dental Status Current problems with teeth and/or dentures?: No Does patient usually wear dentures?: No  CIWA:    COWS:     Musculoskeletal: Strength & Muscle Tone: within normal limits Gait & Station: normal Patient leans:  N/A  Psychiatric Specialty Exam: Physical Exam  Review of Systems  Blood pressure 140/64, pulse (!) 126, temperature 98 F (36.7 C), temperature source Oral, resp. rate 16, height 5\' 4"  (1.626 m), weight 96.2 kg, SpO2 100 %.Body mass index is 36.39 kg/m.  General Appearance: Casual  Eye Contact:  Fair  Speech:  Clear and Coherent  Volume:  Normal  Mood:  Depressed and Dysphoric  Affect:  Congruent  Thought Process:  Coherent  Orientation:  Full (Time, Place, and Person)  Thought Content:  Rumination  Suicidal Thoughts:  Yes.  without intent/plan  Homicidal Thoughts:  No  Memory:  Recent;   Fair  Judgement:  Impaired  Insight:  Shallow  Psychomotor Activity:  Normal  Concentration:  Concentration: Fair  Recall:  [employer] of Knowledge:  Fair  Language:  Poor  Akathisia:  No  Handed:  Right  AIMS (if indicated):     Assets:  Desire for Improvement Housing Physical Health Resilience  ADL's:  Intact  Cognition:  WNL  Sleep:   Number of Hours: 5     Treatment Plan Summary: Daily contact with patient to assess and evaluate symptoms and progress in treatment and Medication management   -Buspar 10 mg TID prn for anxiety -Risperdal 2 mg daily and 3 mg QHS for mood stability -Prozac 20 mg PO daily for depression -Trazodone 200 mg QHS prn for sleep -Protonix 40 mg daily for GERD -Imitrex 100 mf PO, may repeat x1 in 2 hrs for migraines  Patient has been receiving Botox injections for migraines -hydroxyzine 25 mg PO q6h PRN for anxiety -Zofran 8 mg PO q8h for nausea/vomiting Monitor for safety and stabilization Disposition planning in process  Prolactin level for tomorrow am    Dixie Dials, MD 01/18/2020, 11:27 AM
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
## 2020-01-18 NOTE — BHH Group Notes (Signed)
[facility] LCSW Group Therapy  01/18/2020 1:58 PM  Type of Therapy:  Group Therapy: Supports and Sress[facility]  Participation Level:  Active  Participation Quality:  Appropriate and Attentive  Summary of Progress/Problems: Pt shared that she is grateful for being alive and having her beautiful daughter today. When discussing the ecomap, pt shared that family, legal and safety are currently cause stress in her life. Pt shared that her spirituality is a positive. Pt shared that when she feels stressed she doesn't eat and has trouble sleeping. Pt shared that she coped by listening to music and reading the Bible.   Mliss Fritz 01/18/2020, 1:58 PM
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
## 2020-01-19 LAB — PROLACTIN: Prolactin: 128 ng/mL — ABNORMAL HIGH (ref 4.8–23.3)

## 2020-01-19 NOTE — Progress Notes (Signed)
This nurse and Sunnie Nielsen, MHT assisted pt with getting phone from locker and pt writing down phone numbers that she needed. Sunnie Nielsen returned phone to locker, this Probation officer confirmed that cell phone locked in locker.

## 2020-01-19 NOTE — Progress Notes (Signed)
Patient ID: [name], female   DOB: November 16, 1990, 29 y.o.   MRN: [id number]   [facility] MD Progress Note  01/19/2020 9:22 AM [name]  MRN:  [id number]  Principal Problem: MDD (major depressive disorder), recurrent episode, severe ([place]) Diagnosis: Principal Problem:   MDD (major depressive disorder), recurrent episode, severe ([place]) Active Problems:   Borderline personality disorder ([place])   Autism spectrum disorder   Suicidal ideations   Asthma   Seizure disorder ([place])   Carrier of fragile X syndrome   Overdose  Total Time spent with patient: 30 minutes  Daily Note:   Patient seen, chart reviewed, case discussed with treatment team.  Patient states that she was feeling anxious this morning, denied any intent to harm self.  She still reports low mood and feelings of isolation, although reports that these are starting to improve given her time here to rest, group therapy she has received, as well as a restarting of her medications.  A discussion was held with patient about ways to avoid future situations including more to avoid becoming so upset from similar situations.  She is compliant with medications without any side effects reported or observed.  She is agreeable to continue treatment for now.  She was spoken to about contacting her ACT team and she is agreeable.       Past Medical History:  Past Medical History:  Diagnosis Date  . Acid reflux   . Anxiety   . Asthma    last attack 03/13/15 or 03/14/15  . Autism   . 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 Chronic constipation   . Depression   . Drug-seeking behavior   . Essential tremor   . Headache   . Overdose of acetaminophen 07/2017   and other meds  . Personality disorder ([place])   . Schizo-affective psychosis ([place])   . Schizoaffective disorder, bipolar type ([place])   . Seizures [facility])    Last seizure December 2017  . Sleep apnea     Past Surgical History:  Procedure Laterality Date  . MOUTH  SURGERY  2009 or 2010   Family History:  Family History  Problem Relation Age of Onset  . Mental illness Father   . Asthma Father   . PDD Brother   . Seizures Brother     Social History:  Social History   Substance and Sexual Activity  Alcohol Use No  . Alcohol/week: 1.0 standard drink  . Types: 1 Standard drinks or equivalent per week   Comment: denies at this time     Social History   Substance and Sexual Activity  Drug Use No   Comment: History of cocaine use at age 13 for 4 months    Social History   Socioeconomic History  . Marital status: Widowed    Spouse name: Not on file  . Number of children: 0  . Years of education: Not on file  . Highest education level: Not on file  Occupational History  . Occupation: disability  Tobacco Use  . Smoking status: Former Smoker    Packs/day: 0.00  . Smokeless tobacco: Never Used  . Tobacco comment: Smoked for 2  years age [age]  Vaping Use  . Vaping Use: Never used  Substance and Sexual Activity  . Alcohol use: No    Alcohol/week: 1.0 standard drink    Types: 1 Standard drinks or equivalent per week    Comment: denies at this time  . Drug use: No  Comment: History of cocaine use at age 20 for 4 months  . Sexual activity: Yes    Birth control/protection: None  Other Topics Concern  . Not on file  Social History Narrative   Marital status: Widowed      Children: daughter      Lives: with boyfriend, in two story home      Employment:  Disability      Tobacco: quit smoking; smoked for two years.      Alcohol ;none      Drugs: none   Has not traveled outside of the country.   Right handed         Social Determinants of Health   Financial Resource Strain: Not on file  Food Insecurity: Not on file  Transportation Needs: Not on file  Physical Activity: Not on file  Stress: Not on file  Social Connections: Not on file   Additional Social History:                         Sleep: Fair  Appetite:   Fair  Current Medications: Current Facility-Administered Medications  Medication Dose Route Frequency Provider Last Rate Last Admin  . acetaminophen (TYLENOL) tablet 650 mg  650 mg Oral Q6H PRN Ethelene Hal, NP      . albuterol (VENTOLIN HFA) 108 (90 Base) MCG/ACT inhaler 1-2 puff  1-2 puff Inhalation Q6H PRN Ethelene Hal, NP      . alum & mag hydroxide-simeth (MAALOX/MYLANTA) 200-200-20 MG/5ML suspension 30 mL  30 mL Oral Q6H PRN Ethelene Hal, NP      . benzocaine (ORAJEL) 10 % mucosal gel   Mouth/Throat QID PRN Ethelene Hal, NP   Given at 01/19/20 [phone]  . busPIRone (BUSPAR) tablet 10 mg  10 mg Oral TID Mallie Darting, NP   10 mg at 01/19/20 0749  . FLUoxetine (PROZAC) capsule 20 mg  20 mg Oral Daily Ethelene Hal, NP   20 mg at 01/19/20 0749  . hydrOXYzine (ATARAX/VISTARIL) tablet 25 mg  25 mg Oral TID PRN Mallie Darting, NP   25 mg at 01/19/20 0824  . magnesium hydroxide (MILK OF MAGNESIA) suspension 30 mL  30 mL Oral Daily PRN Ethelene Hal, NP      . mometasone-formoterol [facility]) 200-5 MCG/ACT inhaler 2 puff  2 puff Inhalation BID Ethelene Hal, NP   2 puff at 01/19/20 0750  . ondansetron (ZOFRAN) tablet 8 mg  8 mg Oral Q8H PRN Ethelene Hal, NP      . pantoprazole (PROTONIX) EC tablet 40 mg  40 mg Oral Daily Ethelene Hal, NP   40 mg at 01/19/20 0750  . risperiDONE (RISPERDAL) tablet 2 mg  2 mg Oral Daily Ethelene Hal, NP   2 mg at 01/19/20 0750  . risperiDONE (RISPERDAL) tablet 3 mg  3 mg Oral QHS Merlyn Lot E, NP   3 mg at 01/18/20 2155  . SUMAtriptan (IMITREX) tablet 100 mg  100 mg Oral Q2H PRN Ethelene Hal, NP   100 mg at 01/18/20 4332  . traZODone (DESYREL) tablet 200 mg  200 mg Oral QHS Ethelene Hal, NP   200 mg at 01/18/20 2155    Lab Results:  Results for orders placed or performed during the hospital encounter of 01/17/20 (from the past 48 hour(s))  Lipid panel      Status: Abnormal   Collection Time: 01/18/20  6:44  AM  Result Value Ref Range   Cholesterol 173 0 - 200 mg/dL   Triglycerides 159 (H) <150 mg/dL   HDL 49 >40 mg/dL   Total CHOL/HDL Ratio 3.5 RATIO   VLDL 32 0 - 40 mg/dL   LDL Cholesterol 92 0 - 99 mg/dL    Comment:        Total Cholesterol/HDL:CHD Risk Coronary Heart Disease Risk Table                     Men   Women  1/2 Average Risk   3.4   3.3  Average Risk       5.0   4.4  2 X Average Risk   9.6   7.1  3 X Average Risk  23.4   11.0        Use the calculated Patient Ratio above and the CHD Risk Table to determine the patient's CHD Risk.        ATP III CLASSIFICATION (LDL):  <100     mg/dL   Optimal  100-129  mg/dL   Near or Above                    Optimal  130-159  mg/dL   Borderline  160-189  mg/dL   High  >190     mg/dL   Very High Performed at [facility] [address], [place], [place] [address]   T4, free     Status: None   Collection Time: 01/18/20  6:44 AM  Result Value Ref Range   Free T4 0.97 0.61 - 1.12 ng/dL    Comment: (NOTE) Biotin ingestion may interfere with free T4 tests. If the results are inconsistent with the TSH level, previous test results, or the clinical presentation, then consider biotin interference. If needed, order repeat testing after stopping biotin. Performed at [facility], [place] [address], [place],  [address]   TSH     Status: None   Collection Time: 01/18/20  6:44 AM  Result Value Ref Range   TSH 2.470 0.350 - 4.500 uIU/mL    Comment: Performed by a 3rd Generation assay with a functional sensitivity of <=0.01 uIU/mL. Performed at [facility], [place] [address], [place],  [address]   Prolactin     Status: Abnormal   Collection Time: 01/18/20  6:44 AM  Result Value Ref Range   Prolactin 128.0 (H) 4.8 - 23.3 ng/mL    Comment: (NOTE) Performed At: [facility] [place], [place] [id number] Rush Farmer MD ZC:[id number]     Blood Alcohol level:  Lab Results  Component Value Date   [facility] <10 01/15/2020   ETH <10 28/78/6767    Metabolic Disorder Labs: Lab Results  Component Value Date   HGBA1C 5.0 07/02/2019   MPG 96.8 07/02/2019   MPG 91.06 03/06/2019   Lab Results  Component Value Date   PROLACTIN 128.0 (H) 01/18/2020   PROLACTIN 129.0 (H) 07/25/2017   Lab Results  Component Value Date   CHOL 173 01/18/2020   TRIG 159 (H) 01/18/2020   HDL 49 01/18/2020   CHOLHDL 3.5 01/18/2020   VLDL 32 01/18/2020   LDLCALC 92 01/18/2020   LDLCALC 122 (H) 07/02/2019    Physical Findings: AIMS: Facial and Oral Movements Muscles of Facial Expression: None, normal Lips and Perioral Area: None, normal Jaw: None, normal Tongue: None, normal,Extremity Movements Upper (arms, wrists, hands, fingers): None, normal  Lower (legs, knees, ankles, toes): None, normal, Trunk Movements Neck, shoulders, hips: None, normal, Overall Severity Severity of abnormal movements (highest score from questions above): None, normal Incapacitation due to abnormal movements: None, normal Patient's awareness of abnormal movements (rate only patient's report): No Awareness, Dental Status Current problems with teeth and/or dentures?: No Does patient usually wear dentures?: No  CIWA:    COWS:     Musculoskeletal: Strength & Muscle Tone: within normal limits Gait & Station: normal Patient leans: N/A  Psychiatric Specialty Exam: Physical Exam  Review of Systems  Blood pressure 115/63, pulse (!) 129, temperature 97.9 F (36.6 C), temperature source Oral, resp. rate 16, height 5\' 4"  (1.626 m), weight 96.2 kg, SpO2 100 %.Body mass index is 36.39 kg/m.  General Appearance: Casual  Eye Contact:  Fair  Speech:  Clear and Coherent  Volume:  Normal  Mood:  Depressed and Dysphoric  Affect:  Congruent  Thought Process:  Coherent  Orientation:  Full (Time, Place, and Person)  Thought Content:  Rumination   Suicidal Thoughts:  Yes.  without intent/plan  Homicidal Thoughts:  No  Memory:  Recent;   Fair  Judgement:  Impaired  Insight:  Shallow  Psychomotor Activity:  Normal  Concentration:  Concentration: Fair  Recall:  [employer] of Knowledge:  Fair  Language:  Poor  Akathisia:  No  Handed:  Right  AIMS (if indicated):     Assets:  Desire for Improvement Housing Physical Health Resilience  ADL's:  Intact  Cognition:  WNL  Sleep:  Number of Hours: 6.5     Treatment Plan Summary: Daily contact with patient to assess and evaluate symptoms and progress in treatment and Medication management   -Buspar 10 mg TID prn for anxiety -Risperdal 2 mg daily and 3 mg QHS for mood stability -Prozac 20 mg PO daily for depression -Trazodone 200 mg QHS prn for sleep -Protonix 40 mg daily for GERD -Imitrex 100 mf PO, may repeat x1 in 2 hrs for migraines  Patient has been receiving Botox injections for migraines -hydroxyzine 25 mg PO q6h PRN for anxiety -Zofran 8 mg PO q8h for nausea/vomiting Monitor for safety and stabilization Disposition planning in process  Prolactin level is high at 128, however history shows this level in the past and patient is currently without symptoms. Will continue with dose for now and continue to monitor for symptoms.     Dixie Dials, MD 01/19/2020, 9:22 AM
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
## 2020-01-19 NOTE — BHH Counselor (Signed)
CSW received a call from [facility], [phone], with Donley Redder. Caller informed CSW that pt is "a very strong borderline". Caller explained pt's hx with the team and how she has been increasingly more manic the past few weeks due to not taking her medications. Pt has been calling the ACTT crisis phone and various other staff at all times of day and night, multiple times. Caller reports having to do numerous wellness checks on the pt in the last 2 1/2 years due to passive suicidal comments. Caller shared that pt has recently been fixated on the idea that Jesus has healed her and she no longer needs to take her medications and that when ACTT tries to talk to her about it she gets upset and states the acting team is making fun of her religion.   Toney Reil, [place] Worker [employer]
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
## 2020-01-19 NOTE — BHH Counselor (Signed)
CSW had pt sign a consent for [facility]. CSW contacted [facility] to notify the team of her admission. Caller stated someone from the team would call CSW to provide more information about the pt.   Toney Reil, [place] Worker [employer]
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
## 2020-01-20 LAB — PROLACTIN: Prolactin: 129 ng/mL — ABNORMAL HIGH (ref 4.8–23.3)

## 2020-01-20 MED ORDER — LORAZEPAM 1 MG PO TABS
1.0000 mg | ORAL_TABLET | ORAL | Status: DC | PRN
  Administered 2020-01-20: 14:00:00 1 mg via ORAL
  Filled 2020-01-20: qty 1

## 2020-01-20 MED ORDER — RISPERIDONE 3 MG PO TABS
3.0000 mg | ORAL_TABLET | Freq: Two times a day (BID) | ORAL | Status: DC
  Administered 2020-01-20 – 2020-01-22 (×4): 3 mg via ORAL
  Filled 2020-01-20 (×6): qty 1

## 2020-01-20 NOTE — Progress Notes (Signed)
Patient ID: [name], female   DOB: January 09, 1991, 29 y.o.   MRN: [id number]   [facility] MD Progress Note  01/20/2020 9:18 AM [name]  MRN:  [id number]  Principal Problem: MDD (major depressive disorder), recurrent episode, severe ([place])  Diagnosis: Principal Problem:   MDD (major depressive disorder), recurrent episode, severe ([place]) Active Problems:   Borderline personality disorder ([place])   Autism spectrum disorder   Suicidal ideations   Asthma   Seizure disorder ([place])   Carrier of fragile X syndrome   Overdose  Total Time spent with patient: 15 minutes  Subjective: [name] reports, "I'm doing excellent. I have learned my lesson this time to not stop taking my medicines ever again. I feel 100% better. I will be going home after discharge. My boyfriend is actually leaving my apartment as we speak. I'm doing great on my medicines, no side effects. I'm sleeping good. I'm wondering, can I be discharged tomorrow or Friday? I can go home & plan on going to church on Sunday  Objective: [name] is a 29 year old female who was admitted to [facility] from [facility] after an overdose on her home medications. She is well known to this facility. She has a psychiatric history significant for reported schizoaffective disorder, bipolar type, Borderline Personality Disorder, PTSD, and chronic migraines. She was seen in her room shortly after admission. She is in no apparent distress and was snoring when this provider entered the room. She is very difficult to understand due to her rapid, pressured speech. She stated she took "a bunch" of sertraline, buspar, risperidone, Imitrex, hydroxyzine, Zofran, protonix, and sucralfate.  Daily Note:   Patient seen, chart reviewed, case discussed with the treatment team. [name] is seen in her room. She is lying down in her bed. She is easily aroused. She presents with a bright affect. She is alert, oriented & aware of situation. She is verbally responsive only  that her speech is fast, almost pressured. This seem to be her norm as she has been in this [facility] multiple times & this is how her speech presents each time. She reports feeling 100% better. She says she is doing well on her medications without any side effects or reactions. She denies any symptoms of depression or anxiety. She denies any new issues or concerns. She is inquiring if she could be discharged tomorrow or Friday as she is planning on going to church service on Sunday. She says she will be going back to her apartment as her boyfriend is moving out of her apartment. She currently denies any SIHI, AVH, delusional thoughts or paranoia. [name] does not appear to be responding to any internal stimuli.  Past Medical History:  Past Medical History:  Diagnosis Date  . Acid reflux   . Anxiety   . Asthma    last attack 03/13/15 or 03/14/15  . Autism   . 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 Chronic constipation   . Depression   . Drug-seeking behavior   . Essential tremor   . Headache   . Overdose of acetaminophen 07/2017   and other meds  . Personality disorder ([place])   . Schizo-affective psychosis ([place])   . Schizoaffective disorder, bipolar type ([place])   . Seizures [facility])    Last seizure December 2017  . Sleep apnea     Past Surgical History:  Procedure Laterality Date  . MOUTH SURGERY  2009 or 2010   Family History:  Family History  Problem Relation  Age of Onset  . Mental illness Father   . Asthma Father   . PDD Brother   . Seizures Brother    Social History:  Social History   Substance and Sexual Activity  Alcohol Use No  . Alcohol/week: 1.0 standard drink  . Types: 1 Standard drinks or equivalent per week   Comment: denies at this time     Social History   Substance and Sexual Activity  Drug Use No   Comment: History of cocaine use at age 42 for 4 months    Social History   Socioeconomic History  . Marital status: Widowed    Spouse name: Not on file  . Number of  children: 0  . Years of education: Not on file  . Highest education level: Not on file  Occupational History  . Occupation: disability  Tobacco Use  . Smoking status: Former Smoker    Packs/day: 0.00  . Smokeless tobacco: Never Used  . Tobacco comment: Smoked for 2  years age [age]  Vaping Use  . Vaping Use: Never used  Substance and Sexual Activity  . Alcohol use: No    Alcohol/week: 1.0 standard drink    Types: 1 Standard drinks or equivalent per week    Comment: denies at this time  . Drug use: No    Comment: History of cocaine use at age 1 for 4 months  . Sexual activity: Yes    Birth control/protection: None  Other Topics Concern  . Not on file  Social History Narrative   Marital status: Widowed      Children: daughter      Lives: with boyfriend, in two story home      Employment:  Disability      Tobacco: quit smoking; smoked for two years.      Alcohol ;none      Drugs: none   Has not traveled outside of the country.   Right handed         Social Determinants of Health   Financial Resource Strain: Not on file  Food Insecurity: Not on file  Transportation Needs: Not on file  Physical Activity: Not on file  Stress: Not on file  Social Connections: Not on file   Additional Social History:   Sleep: Good  Appetite:  Fair  Current Medications: Current Facility-Administered Medications  Medication Dose Route Frequency Provider Last Rate Last Admin  . acetaminophen (TYLENOL) tablet 650 mg  650 mg Oral Q6H PRN Ethelene Hal, NP      . albuterol (VENTOLIN HFA) 108 (90 Base) MCG/ACT inhaler 1-2 puff  1-2 puff Inhalation Q6H PRN Ethelene Hal, NP   2 puff at 01/19/20 1909  . alum & mag hydroxide-simeth (MAALOX/MYLANTA) 200-200-20 MG/5ML suspension 30 mL  30 mL Oral Q6H PRN Ethelene Hal, NP      . benzocaine (ORAJEL) 10 % mucosal gel   Mouth/Throat QID PRN Ethelene Hal, NP   Given at 01/19/20 [phone]  . busPIRone (BUSPAR) tablet 10 mg   10 mg Oral TID Mallie Darting, NP   10 mg at 01/20/20 0807  . FLUoxetine (PROZAC) capsule 20 mg  20 mg Oral Daily Ethelene Hal, NP   20 mg at 01/20/20 8466  . hydrOXYzine (ATARAX/VISTARIL) tablet 25 mg  25 mg Oral TID PRN Mallie Darting, NP   25 mg at 01/19/20 2145  . magnesium hydroxide (MILK OF MAGNESIA) suspension 30 mL  30 mL Oral Daily PRN Romilda Garret,  Billey Chang, NP      . mometasone-formoterol [facility]) 200-5 MCG/ACT inhaler 2 puff  2 puff Inhalation BID Ethelene Hal, NP   2 puff at 01/20/20 [phone]  . ondansetron (ZOFRAN) tablet 8 mg  8 mg Oral Q8H PRN Ethelene Hal, NP   8 mg at 01/19/20 1218  . pantoprazole (PROTONIX) EC tablet 40 mg  40 mg Oral Daily Ethelene Hal, NP   40 mg at 01/20/20 9470  . risperiDONE (RISPERDAL) tablet 2 mg  2 mg Oral Daily Ethelene Hal, NP   2 mg at 01/20/20 9628  . risperiDONE (RISPERDAL) tablet 3 mg  3 mg Oral QHS Merlyn Lot E, NP   3 mg at 01/19/20 2145  . SUMAtriptan (IMITREX) tablet 100 mg  100 mg Oral Q2H PRN Ethelene Hal, NP   100 mg at 01/19/20 1703  . traZODone (DESYREL) tablet 200 mg  200 mg Oral QHS Ethelene Hal, NP   200 mg at 01/19/20 2145   Lab Results:  Results for orders placed or performed during the hospital encounter of 01/17/20 (from the past 48 hour(s))  Prolactin     Status: Abnormal   Collection Time: 01/19/20  6:36 AM  Result Value Ref Range   Prolactin 129.0 (H) 4.8 - 23.3 ng/mL    Comment: (NOTE) Performed At: [facility] [place], [place] [id number] Rush Farmer MD YY:[id number]    Blood Alcohol level:  Lab Results  Component Value Date   [facility] <10 01/15/2020   ETH <10 27/51/7001   Metabolic Disorder Labs: Lab Results  Component Value Date   HGBA1C 5.0 07/02/2019   MPG 96.8 07/02/2019   MPG 91.06 03/06/2019   Lab Results  Component Value Date   PROLACTIN 129.0 (H) 01/19/2020   PROLACTIN 128.0 (H) 01/18/2020   Lab Results   Component Value Date   CHOL 173 01/18/2020   TRIG 159 (H) 01/18/2020   HDL 49 01/18/2020   CHOLHDL 3.5 01/18/2020   VLDL 32 01/18/2020   LDLCALC 92 01/18/2020   LDLCALC 122 (H) 07/02/2019   Physical Findings: AIMS: Facial and Oral Movements Muscles of Facial Expression: None, normal Lips and Perioral Area: None, normal Jaw: None, normal Tongue: None, normal,Extremity Movements Upper (arms, wrists, hands, fingers): None, normal Lower (legs, knees, ankles, toes): None, normal, Trunk Movements Neck, shoulders, hips: None, normal, Overall Severity Severity of abnormal movements (highest score from questions above): None, normal Incapacitation due to abnormal movements: None, normal Patient's awareness of abnormal movements (rate only patient's report): No Awareness, Dental Status Current problems with teeth and/or dentures?: No Does patient usually wear dentures?: No  CIWA:    COWS:     Musculoskeletal: Strength & Muscle Tone: within normal limits Gait & Station: normal Patient leans: N/A  Psychiatric Specialty Exam: Physical Exam Vitals and nursing note reviewed.  HENT:     Head: Normocephalic.     Nose: Nose normal.     Mouth/Throat:     Pharynx: Oropharynx is clear.  Eyes:     Pupils: Pupils are equal, round, and reactive to light.  Cardiovascular:     Rate and Rhythm: Normal rate.     Comments: Elevated pulse rate : 115 Pulmonary:     Effort: Pulmonary effort is normal.  Abdominal:     Palpations: Abdomen is soft.  Genitourinary:    Comments: Deferred Musculoskeletal:        General: Normal range of motion.     Cervical  back: Normal range of motion.  Skin:    General: Skin is warm and dry.  Neurological:     General: No focal deficit present.     Mental Status: She is alert and oriented to person, place, and time.     Review of Systems  Constitutional: Negative for chills, diaphoresis and fever.  HENT: Negative for congestion, rhinorrhea, sneezing and  sore throat.   Eyes: Negative for discharge.  Respiratory: Negative for cough, chest tightness, shortness of breath and wheezing.   Cardiovascular: Negative for chest pain and palpitations.  Gastrointestinal: Negative for diarrhea, nausea and vomiting.  Endocrine: Negative for cold intolerance.  Genitourinary: Negative for difficulty urinating.  Musculoskeletal: Negative for arthralgias and myalgias.  Skin: Negative.   Allergic/Immunologic: Positive for environmental allergies (Bee Venom) and food allergies (Coconut, Shell fish). Negative for immunocompromised state.       Coconut  Geodon  Haloperidol  Lithium High Oxycodone Seroquel   Shellfish  Phenergan  Prilosec  Sulfa drugs  Tegretol  Tape       Neurological: Negative for dizziness, tremors, seizures, syncope, facial asymmetry, speech difficulty, weakness, light-headedness, numbness and headaches.  Psychiatric/Behavioral: Positive for dysphoric mood. Negative for agitation, behavioral problems, confusion, decreased concentration, self-injury, sleep disturbance and suicidal ideas (Hx. of). The patient is not nervous/anxious and is not hyperactive.     Blood pressure 101/65, pulse (!) 115, temperature 97.6 F (36.4 C), temperature source Oral, resp. rate 16, height 5\' 4"  (1.626 m), weight 96.2 kg, SpO2 100 %.Body mass index is 36.39 kg/m.  General Appearance: Casual  Eye Contact:  Fair  Speech:  Clear and Coherent  Volume:  Normal  Mood:  Euthymic  Affect:  Appropriate and Congruent  Thought Process:  Coherent and Descriptions of Associations: Intact  Orientation:  Full (Time, Place, and Person)  Thought Content:  Logical, denies any hallucinations, delusions or paranoia.  Suicidal Thoughts:  Currently denies any thoughts, plans or intent.  Homicidal Thoughts:  Denies  Memory:  Immediate;   Good Recent;   Fair Remote;   Fair  Judgement:  Fair  Insight:  Fair and Shallow  Psychomotor Activity:  Normal  Concentration:   Concentration: Fair  Recall:  [employer] of Knowledge:  Fair  Language:  Poor  Akathisia:  No  Handed:  Right  AIMS (if indicated):     Assets:  Desire for Improvement Housing Physical Health Resilience  ADL's:  Intact  Cognition:  WNL  Sleep:  Number of Hours: 6.25   Treatment Plan Summary: Daily contact with patient to assess and evaluate symptoms and progress in treatment and Medication management.  Continue inpatient hospitalization. Will continue today 01/20/2020 plan as below except where it is noted.  -Buspar 10 mg TID prn for anxiety -Risperdal 2 mg po daily for mood control -Risperdal 3 mg po QHS for mood control -Prozac 20 mg po daily for depression -Trazodone 200 mg po QHS prn for insomnia -Protonix 40 mg daily po for GERD -Imitrex 100 mg po, may repeat x 1 in 2 hrs for migraine headaches.  *Patient has been receiving Botox injections for migraines -hydroxyzine 25 mg po q6h PRN for anxiety -Zofran 8 mg po q8h for nausea/vomiting Monitor for safety and stabilization Disposition planning in process  Prolactin level is high at 128, however history shows this level in the past and patient is currently without symptoms. Will continue with dose for now and continue to monitor for symptoms.   Lindell Spar, NP, PMHNP, FNP-BC 01/20/2020,  9:18 AM Patient ID: [name], female   DOB: Jun 17, 1990, 29 y.o.   MRN: [id number]

## 2020-01-20 NOTE — Progress Notes (Signed)
Adult Psychoeducational Group Note  Date:  01/20/2020 Time:  12:39 AM  Group Topic/Focus:  Wrap-Up Group:   The focus of this group is to help patients review their daily goal of treatment and discuss progress on daily workbooks.  Participation Level:  Active  Participation Quality:  Appropriate  Affect:  Appropriate  Cognitive:  Appropriate  Insight: Appropriate  Engagement in Group:  Engaged  Modes of Intervention:  Discussion  Additional Comments:  Pt attend wrap up group. Her day was a 6. The one positive thing  that happen she talk to her ex boyfriend.  Lenice Llamas Long 01/20/2020, 12:39 AM

## 2020-01-20 NOTE — Progress Notes (Signed)
Recreation Therapy Notes  Date:  12.15.21 Time: 0930 Location: [address] Dayroom  Group Topic: Stress Management  Goal Area(s) Addresses:  Patient will identify positive stress management techniques. Patient will identify benefits of using stress management post d/c.  Intervention: Stress Management  Activity: Meditation.  LRT played a meditation that focused on being able to self sooth when dealing with things such as anger, anxiety, depression, etc.  The meditation focused on taking the time for yourself you need to calm your emotions.    Education:  Stress Management, Discharge Planning.   Education Outcome: Acknowledges Education  Clinical Observations/Feedback: Pt did not attend group activity.    Victorino Sparrow, LRT/CTRS         Ria Comment, Marisa Hufstetler A 01/20/2020 10:06 AM

## 2020-01-20 NOTE — Progress Notes (Signed)
Psychoeducational Group Note  Date:  01/20/2020 Time:  1100  Group Topic/Focus:  Self Esteem Action Plan:   The focus of this group is to help patients create a plan to continue to build self-esteem after discharge.  Participation Level: Did Not Attend  Participation Quality:  Not Applicable  Affect:  Not Applicable  Cognitive:  Not Applicable  Insight:  Not Applicable  Engagement in Group: Not Applicable  Additional Comments:  Pt refused to attend group this morning.  [name] E 01/20/2020, 11:15 AM

## 2020-01-20 NOTE — Progress Notes (Signed)
D:  [name] has been in the bed all morning.  Minimal participation in group and unit activities.  She denied SI/HI or A/V hallucinations.  She reported that she slept well last night.  She denied any pain or discomfort and appeared to be in no physical distress.  She completed her self inventory and reported depression 2/10, hopelessness 0/10 and anxiety 4/10 (10 the worst).  She stated her goal for today is "talk to the doctor about discharge" and she will accomplish this goal by "talk to doctor."  She became angry  this afternoon because she didn't have her Buspar at lunch.  Explained that she has prn Ativan which she accepted with much encouragement and had good relief.  She did complain of a migraine and Imitrex given with good results. A:  1:1 with RN for support and encouragement.  Medications as ordered.  Q 15 minute checks maintained for safety.  Encouraged participation in group and unit activities.   R:  She remains safe on the unit.  We will continue to monitor the progress towards her goals.

## 2020-01-21 MED ORDER — IBUPROFEN 400 MG PO TABS
400.0000 mg | ORAL_TABLET | Freq: Once | ORAL | Status: AC
  Administered 2020-01-21: 22:00:00 400 mg via ORAL
  Filled 2020-01-21 (×2): qty 1

## 2020-01-21 NOTE — Plan of Care (Signed)
Nurse discussed anxiety, depression and coping skills with patient.  

## 2020-01-21 NOTE — Progress Notes (Signed)
**[name] De-Identified via Obfuscation** Patient ID: Evelyn [name], female   DOB: [birth year], 29 y.o.   MRN: [id number]   Evelyn [name]  01/21/2020 10:36 AM Evelyn [name]  MRN:  [id number]  Principal Problem: MDD (major depressive disorder), recurrent episode, severe (Evelyn [name])  Diagnosis: Principal Problem:   MDD (major depressive disorder), recurrent episode, severe (Evelyn [name]) Active Problems:   Borderline personality disorder ([name])   Autism spectrum disorder   Suicidal ideations   Asthma   Seizure disorder (Evelyn [name])   Carrier of fragile X syndrome   Overdose  Total Time spent with patient: 15 minutes  Subjective: Evelyn [name] reports, "I'm still doing good, I just wanna get out of here. I'm ready to go home. My mood is good. I have no depression or anxiety. I was just upset yesterday because someone stopped my Buspar without telling me. I need to be told if any changes were made on my medicines or when any of my medicines were stopped. I hope I get discharged tomorrow".  Objective: Evelyn [name] is a 29 year old female who was admitted to [facility] from [facility] after an overdose on her home medications. She is well known to this facility. She has a psychiatric history significant for reported schizoaffective disorder, bipolar type, Borderline Personality Disorder, PTSD, and chronic migraines. She was seen in her room shortly after admission. She is in no apparent distress and was snoring when this provider entered the room. She is very difficult to understand due to her rapid, pressured speech. She stated she took "a bunch" of sertraline, buspar, risperidone, Imitrex, hydroxyzine, Zofran, protonix, and sucralfate.  Daily [name]:   Evelyn [name] is seen, chart reviewed, case discussed with the treatment team. Evelyn [name] is seen in her room. She is lying down in her bed. She is easily aroused. She presents with a bright affect. She is alert, oriented & aware of situation. She is verbally responsive only that her speech is fast, almost pressured. This  seem to be her norm as she has been in this [name] multiple times & this is how her speech presents each time. She reports being in a good mood today. She says she is doing well on her medications without any side effects or reactions. She denies any symptoms of depression or anxiety. She denies any new issues or concerns. She is hoping to get discharged tomorrow because she is so ready to be discharged from here.  She said yesterday that she will be going back to her apartment as her boyfriend has or is moving out of her apartment. Evelyn [name] says she was upset yesterday because someone stopped her Buspar without her knowledge. She prefers to be informed prior to making any changes or stopping any of her medications. She currently denies any SIHI, AVH, delusional thoughts or paranoia. Evelyn [name] does not appear to be responding to any internal stimuli. She is in agreement to continue her current plan of care as already in progress.  Past Medical History:  Past Medical History:  Diagnosis Date  . Acid reflux   . Anxiety   . Asthma    last attack 03/13/15 or 03/14/15  . Autism   . 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 Chronic constipation   . Depression   . Drug-seeking behavior   . Essential tremor   . Headache   . Overdose of acetaminophen 07/2017   and other meds  . Personality disorder ([place])   . Schizo-affective psychosis ([name])   . Schizoaffective disorder, bipolar type ([name])   .  Seizures [name])    Last seizure December 2017  . Sleep apnea     Past Surgical History:  Procedure Laterality Date  . MOUTH SURGERY  2009 or 2010   Family History:  Family History  Problem Relation Age of Onset  . Mental illness Father   . Asthma Father   . PDD Brother   . Seizures Brother    Social History:  Social History   Substance and Sexual Activity  Alcohol Use No  . Alcohol/week: 1.0 standard drink  . Types: 1 Standard drinks or equivalent per week   Comment: denies at this time     Social History    Substance and Sexual Activity  Drug Use No   Comment: History of cocaine use at age 9 for 4 months    Social History   Socioeconomic History  . Marital status: Widowed    Spouse name: Not on file  . Number of children: 0  . Years of education: Not on file  . Highest education level: Not on file  Occupational History  . Occupation: disability  Tobacco Use  . Smoking status: Former Smoker    Packs/day: 0.00  . Smokeless tobacco: Never Used  . Tobacco comment: Smoked for 2  years age [age]  Vaping Use  . Vaping Use: Never used  Substance and Sexual Activity  . Alcohol use: No    Alcohol/week: 1.0 standard drink    Types: 1 Standard drinks or equivalent per week    Comment: denies at this time  . Drug use: No    Comment: History of cocaine use at age 60 for 4 months  . Sexual activity: Yes    Birth control/protection: None  Other Topics Concern  . Not on file  Social History Narrative   Marital status: Widowed      Children: daughter      Lives: with boyfriend, in two story home      Employment:  Disability      Tobacco: quit smoking; smoked for two years.      Alcohol ;none      Drugs: none   Has not traveled outside of the country.   Right handed         Social Determinants of Health   Financial Resource Strain: Not on file  Food Insecurity: Not on file  Transportation Needs: Not on file  Physical Activity: Not on file  Stress: Not on file  Social Connections: Not on file   Additional Social History:   Sleep: Good  Appetite:  Fair  Current Medications: Current Facility-Administered Medications  Medication Dose Route Frequency Provider Last Rate Last Admin  . acetaminophen (TYLENOL) tablet 650 mg  650 mg Oral Q6H PRN Ethelene Hal, NP      . albuterol (VENTOLIN HFA) 108 (90 Base) MCG/ACT inhaler 1-2 puff  1-2 puff Inhalation Q6H PRN Ethelene Hal, NP   2 puff at 01/21/20 0957  . alum & mag hydroxide-simeth (MAALOX/MYLANTA) 200-200-20  MG/5ML suspension 30 mL  30 mL Oral Q6H PRN Ethelene Hal, NP      . benzocaine (ORAJEL) 10 % mucosal gel   Mouth/Throat QID PRN Ethelene Hal, NP   Given at 01/20/20 2147  . FLUoxetine (PROZAC) capsule 20 mg  20 mg Oral Daily Ethelene Hal, NP   20 mg at 01/21/20 0751  . hydrOXYzine (ATARAX/VISTARIL) tablet 25 mg  25 mg Oral TID PRN Mallie Darting, NP   25 mg at  01/21/20 0958  . LORazepam (ATIVAN) tablet 1 mg  1 mg Oral Q4H PRN Cristofano, Dorene Ar, MD   1 mg at 01/20/20 1333  . magnesium hydroxide (MILK OF MAGNESIA) suspension 30 mL  30 mL Oral Daily PRN Ethelene Hal, NP      . mometasone-formoterol [facility]) 200-5 MCG/ACT inhaler 2 puff  2 puff Inhalation BID Ethelene Hal, NP   2 puff at 01/21/20 0751  . ondansetron (ZOFRAN) tablet 8 mg  8 mg Oral Q8H PRN Ethelene Hal, NP   8 mg at 01/20/20 1054  . pantoprazole (PROTONIX) EC tablet 40 mg  40 mg Oral Daily Ethelene Hal, NP   40 mg at 01/21/20 0750  . risperiDONE (RISPERDAL) tablet 3 mg  3 mg Oral BID Cristofano, Dorene Ar, MD   3 mg at 01/21/20 0750  . SUMAtriptan (IMITREX) tablet 100 mg  100 mg Oral Q2H PRN Ethelene Hal, NP   100 mg at 01/20/20 1352  . traZODone (DESYREL) tablet 200 mg  200 mg Oral QHS Ethelene Hal, NP   200 mg at 01/20/20 2145   Lab Results:  No results found for this or any previous visit (from the past 48 hour(s)). Blood Alcohol level:  Lab Results  Component Value Date   ETH <10 01/15/2020   ETH <10 16/11/9602   Metabolic Disorder Labs: Lab Results  Component Value Date   HGBA1C 5.0 07/02/2019   MPG 96.8 07/02/2019   MPG 91.06 03/06/2019   Lab Results  Component Value Date   PROLACTIN 129.0 (H) 01/19/2020   PROLACTIN 128.0 (H) 01/18/2020   Lab Results  Component Value Date   CHOL 173 01/18/2020   TRIG 159 (H) 01/18/2020   HDL 49 01/18/2020   CHOLHDL 3.5 01/18/2020   VLDL 32 01/18/2020   LDLCALC 92 01/18/2020   LDLCALC 122 (H)  07/02/2019   Physical Findings: AIMS: Facial and Oral Movements Muscles of Facial Expression: None, normal Lips and Perioral Area: None, normal Jaw: None, normal Tongue: None, normal,Extremity Movements Upper (arms, wrists, hands, fingers): None, normal Lower (legs, knees, ankles, toes): None, normal, Trunk Movements Neck, shoulders, hips: None, normal, Overall Severity Severity of abnormal movements (highest score from questions above): None, normal Incapacitation due to abnormal movements: None, normal Patient's awareness of abnormal movements (rate only patient's report): No Awareness, Dental Status Current problems with teeth and/or dentures?: No Does patient usually wear dentures?: No  CIWA:    COWS:     Musculoskeletal: Strength & Muscle Tone: within normal limits Gait & Station: normal Patient leans: N/A  Psychiatric Specialty Exam: Physical Exam Vitals and nursing [name] reviewed.  HENT:     Head: Normocephalic.     Nose: Nose normal.     Mouth/Throat:     Pharynx: Oropharynx is clear.  Eyes:     Pupils: Pupils are equal, round, and reactive to light.  Cardiovascular:     Rate and Rhythm: Normal rate.     Comments: Elevated pulse rate : 115 Pulmonary:     Effort: Pulmonary effort is normal.  Abdominal:     Palpations: Abdomen is soft.  Genitourinary:    Comments: Deferred Musculoskeletal:        General: Normal range of motion.     Cervical back: Normal range of motion.  Skin:    General: Skin is warm and dry.  Neurological:     General: No focal deficit present.     Mental Status: She is alert  and oriented to person, place, and time.     Review of Systems  Constitutional: Negative for chills, diaphoresis and fever.  HENT: Negative for congestion, rhinorrhea, sneezing and sore throat.   Eyes: Negative for discharge.  Respiratory: Negative for cough, chest tightness, shortness of breath and wheezing.   Cardiovascular: Negative for chest pain and  palpitations.  Gastrointestinal: Negative for diarrhea, nausea and vomiting.  Endocrine: Negative for cold intolerance.  Genitourinary: Negative for difficulty urinating.  Musculoskeletal: Negative for arthralgias and myalgias.  Skin: Negative.   Allergic/Immunologic: Positive for environmental allergies (Bee Venom) and food allergies (Coconut, Shell fish). Negative for immunocompromised state.       Coconut  Geodon  Haloperidol  Lithium High Oxycodone Seroquel   Shellfish  Phenergan  Prilosec  Sulfa drugs  Tegretol  Tape       Neurological: Negative for dizziness, tremors, seizures, syncope, facial asymmetry, speech difficulty, weakness, light-headedness, numbness and headaches.  Psychiatric/Behavioral: Positive for dysphoric mood. Negative for agitation, behavioral problems, confusion, decreased concentration, self-injury, sleep disturbance and suicidal ideas (Hx. of). The patient is not nervous/anxious and is not hyperactive.     Blood pressure 109/67, pulse (!) 126, temperature 97.8 F (36.6 C), temperature source Oral, resp. rate 16, height 5\' 4"  (1.626 m), weight 96.2 kg, SpO2 98 %.Body mass index is 36.39 kg/m.  General Appearance: Casual  Eye Contact:  Fair  Speech:  Clear and Coherent  Volume:  Normal  Mood:  Euthymic  Affect:  Appropriate and Congruent  Thought Process:  Coherent and Descriptions of Associations: Intact  Orientation:  Full (Time, Place, and Person)  Thought Content:  Logical, denies any hallucinations, delusions or paranoia.  Suicidal Thoughts:  Currently denies any thoughts, plans or intent.  Homicidal Thoughts:  Denies  Memory:  Immediate;   Good Recent;   Fair Remote;   Fair  Judgement:  Fair  Insight:  Fair and Shallow  Psychomotor Activity:  Normal  Concentration:  Concentration: Fair  Recall:  [employer] of Knowledge:  Fair  Language:  Poor  Akathisia:  No  Handed:  Right  AIMS (if indicated):     Assets:  Desire for  Improvement Housing Physical Health Resilience  ADL's:  Intact  Cognition:  WNL  Sleep:  Number of Hours: 6.25   Treatment Plan Summary: Daily contact with patient to assess and evaluate symptoms and progress in treatment and Medication management.  Continue inpatient hospitalization. Will continue today 01/21/2020 plan as below except where it is noted.  -Buspar 10 mg TID prn for anxiety -Discontinued Risperdal 2 mg po daily for mood control -Increased Risperdal to 3 mg po bid for mood control -Prozac 20 mg po daily for depression -Trazodone 200 mg po QHS prn for insomnia -Protonix 40 mg daily po for GERD -Imitrex 100 mg po, may repeat x 1 in 2 hrs for migraine headaches.  *Patient has been receiving Botox injections for migraines -hydroxyzine 25 mg po q6h PRN for anxiety -Zofran 8 mg po q8h for nausea/vomiting Monitor for safety and stabilization Disposition planning in process  Prolactin level is high at 128, however history shows this level in the past and patient is currently without symptoms. Will continue with dose for now and continue to monitor for symptoms.   Lindell Spar, NP, PMHNP, FNP-BC 01/21/2020, 10:36 AM Patient ID: [name], female   DOB: September 09, 1990, 29 y.o.   MRN: [id number]

## 2020-01-21 NOTE — Progress Notes (Signed)
Psychoeducational Group Note  Date:  01/21/2020 Time:  2100  Group Topic/Focus:  wrap up group  Participation Level: Did Not Attend  Participation Quality:  Not Applicable  Affect:  Not Applicable  Cognitive:  Not Applicable  Insight:  Not Applicable  Engagement in Group: Not Applicable  Additional Coments: Pt was notified that group was beginning but did not attend.   Shellia Cleverly 01/21/2020, 11:39 PM

## 2020-01-21 NOTE — BHH Counselor (Addendum)
CSW received a call from [facility] requesting a d/c date for the pt. CSW informed them that pt will d/c 12/17 and will be transported via [facility].   Toney Reil, [place] Worker [employer]

## 2020-01-21 NOTE — Progress Notes (Signed)
D:  Patient's self inventory sheet, patient sleeps good, sleep medication helpful.  Good appetite, normal energy level, good concentration.  Denied depression and hopeless, rated anxiety 3.  No withdrawals.  Denied SI, contracts for safety.  Denied physical problems.  Denied physical pain.  Goal is discharge today.  Plans to talk to MD.  Does have discharge plans. A:  Medications administered per MD orders.  Emotional support and encouragement given patient. R:  Denied SI and HI, contracts for safety.  Denied A/V hallucinations.  Safety maintained with 15 minute checks.

## 2020-01-21 NOTE — SANE Note (Signed)
SANE consult released, via web link to encrypted [place] file, to [employer] the [employer], with patient's signed ROI

## 2020-01-21 NOTE — Progress Notes (Signed)
   01/21/20 0213  Psych Admission Type (Psych Patients Only)  Admission Status Involuntary  Psychosocial Assessment  Patient Complaints Other (Comment) (just worried about her Buspar being discontinued)  Eye Contact Fair  Facial Expression Anxious  Affect Anxious  Speech Logical/coherent  Interaction Assertive;Childlike  Appearance/Hygiene Disheveled  Behavior Characteristics Calm  Mood Anxious  Thought Process  Coherency WDL  Content Preoccupation  Delusions WDL  Perception WDL  Hallucination None reported or observed  Judgment WDL  Confusion WDL  Danger to Self  Current suicidal ideation? Denies  Danger to Others  Danger to Others None reported or observed

## 2020-01-22 MED ORDER — MOMETASONE FURO-FORMOTEROL FUM 200-5 MCG/ACT IN AERO: 2.0000 | INHALATION_SPRAY | Freq: Two times a day (BID) | RESPIRATORY_TRACT | 0 refills | Status: DC

## 2020-01-22 MED ORDER — FLUOXETINE HCL 20 MG PO CAPS: 20.0000 mg | ORAL_CAPSULE | Freq: Every day | ORAL | 0 refills | Status: DC

## 2020-01-22 MED ORDER — INFLUENZA VAC A&B SA ADJ QUAD 0.5 ML IM PRSY
0.5000 mL | PREFILLED_SYRINGE | INTRAMUSCULAR | Status: DC
  Filled 2020-01-22: qty 0.5

## 2020-01-22 MED ORDER — TRAZODONE HCL 100 MG PO TABS: 200.0000 mg | ORAL_TABLET | Freq: Every day | ORAL | 0 refills | Status: DC

## 2020-01-22 MED ORDER — BENZOCAINE 10 % MT GEL: Freq: Four times a day (QID) | OROMUCOSAL | 0 refills | Status: DC | PRN

## 2020-01-22 MED ORDER — HYDROXYZINE HCL 25 MG PO TABS: 25.0000 mg | ORAL_TABLET | Freq: Three times a day (TID) | ORAL | 0 refills | Status: DC | PRN

## 2020-01-22 MED ORDER — RISPERIDONE 3 MG PO TABS: 3.0000 mg | ORAL_TABLET | Freq: Two times a day (BID) | ORAL | 0 refills | Status: DC

## 2020-01-22 NOTE — Progress Notes (Signed)
  [facility] Adult Case Management Discharge Plan :  Will you be returning to the same living situation after discharge:  Yes,  personal residence At discharge, do you have transportation home?: Yes,  via safe transport Do you have the ability to pay for your medications: Yes,  has insurance  Release of information consent forms completed and in the chart;  Patient's signature needed at discharge.  Patient to Follow up at:  Follow-up Information    [facility] Follow up.   Why: Continue to follow-up with your ACTT Team for medication management and therapy.  Contact information: [phone] (Regular)              Next level of care provider has access to [place] and Suicide Prevention discussed: Yes,  w/ pt  Have you used any form of tobacco in the last 30 days? (Cigarettes, Smokeless Tobacco, Cigars, and/or Pipes): No  Has patient been referred to the Quitline?: Patient refused referral  Patient has been referred for addiction treatment: N/A  Mliss Fritz, LCSWA 01/22/2020, 9:23 AM

## 2020-01-22 NOTE — Plan of Care (Signed)
Patient mood is seems to be  improving and patient is able to contract for safety, using appropriate coping skills and verbalize needs appropriately.

## 2020-01-22 NOTE — Progress Notes (Signed)
Recreation Therapy Notes  Date:  12.17.21 Time: 0930 Location: [address] Hall Group Room  Group Topic: Stress Management  Goal Area(s) Addresses:  Patient will identify positive stress management techniques. Patient will identify benefits of using stress management post d/c.  Behavioral Response: Engaged  Intervention: Stress Management  Activity: Progressive Muscle Relaxation.  LRT led group in progressive muscle relaxation.  Patients were to tense their muscles but not to the point of strain and then release the tension.  This was done for each muscle group individually. If patients had any areas that were sore or injured they could skip those affected areas.  Patients were to listen and follow along as LRT read script.    Education:  Stress Management, Discharge Planning.   Education Outcome: Acknowledges Education  Clinical Observations/Feedback: Pt attended and participated in group activity.     Victorino Sparrow, LRT/CTRS    Ria Comment, Pinky Ravan A 01/22/2020 10:10 AM

## 2020-01-22 NOTE — BHH Suicide Risk Assessment (Signed)
[employer] Discharge Suicide Risk Assessment   Principal Problem: MDD (major depressive disorder), recurrent episode, severe ([place]) Discharge Diagnoses: Principal Problem:   MDD (major depressive disorder), recurrent episode, severe ([place]) Active Problems:   Borderline personality disorder ([place])   Autism spectrum disorder   Suicidal ideations   Asthma   Seizure disorder ([place])   Carrier of fragile X syndrome   Overdose   Total Time spent with patient: 20 minutes  Musculoskeletal: Strength & Muscle Tone: within normal limits Gait & Station: normal Patient leans: N/A  Psychiatric Specialty Exam: Review of Systems  Blood pressure (!) 143/81, pulse (!) 101, temperature 97.9 F (36.6 C), temperature source Oral, resp. rate 18, height 5\' 4"  (1.626 m), weight 96.2 kg, SpO2 100 %.Body mass index is 36.39 kg/m.  General Appearance: Casual  Eye Contact::  Fair  Speech:  Clear and [id number]  Volume:  Normal  Mood:  Euthymic  Affect:  Appropriate  Thought Process:  Coherent  Orientation:  Full (Time, Place, and Person)  Thought Content:  Logical  Suicidal Thoughts:  No  Homicidal Thoughts:  No  Memory:  Recent;   Fair  Judgement:  Fair  Insight:  Fair  Psychomotor Activity:  Normal  Concentration:  Fair  Recall:  [employer] of Knowledge:Fair  Language: Fair  Akathisia:  No  Handed:  Right  AIMS (if indicated):     Assets:  Communication Skills Desire for Improvement Physical Health Social Support  Sleep:  Number of Hours: 5.75  Cognition: WNL  ADL's:  Intact   Mental Status Per Nursing Assessment::   On Admission:  Suicidal ideation indicated by patient,Self-harm behaviors,Intention to act on plan to harm others,Intention to act on suicide plan,Suicide plan,Belief that plan would result in death,Plan includes specific time, place, or method,Self-harm thoughts  Demographic Factors:  Caucasian and Low socioeconomic status  Loss Factors: Loss of significant  relationship  Historical Factors: Prior suicide attempts and Impulsivity  Risk Reduction Factors:   Positive social support and Positive therapeutic relationship  Continued Clinical Symptoms:  Previous Psychiatric Diagnoses and Treatments  Cognitive Features That Contribute To Risk:  Closed-mindedness    Suicide Risk:  Mild:  Suicidal ideation of limited frequency, intensity, duration, and specificity.  There are no identifiable plans, no associated intent, mild dysphoria and related symptoms, good self-control (both objective and subjective assessment), few other risk factors, and identifiable protective factors, including available and accessible social support.   Follow-up Information    [facility] Follow up.   Why: Continue to follow-up with your ACTT Team for medication management and therapy.  Contact information: [phone] (Regular)              Plan Of Care/Follow-up recommendations:  Other:  Follow-up with outpatient care  Dixie Dials, MD 01/22/2020, 9:38 AM

## 2020-01-22 NOTE — Progress Notes (Signed)
Patient is cooperative with treatment, she remains anxious and sad but she was medication compliant. She was visible in the milieu. She denies depression and hopelessness. She appears to be in bed resting quietly at this time.

## 2020-01-22 NOTE — Tx Team (Signed)
Interdisciplinary Treatment and Diagnostic Plan Update  01/22/2020 Time of Session: 9:00am [name] MRN: [id number]  Principal Diagnosis: MDD (major depressive disorder), recurrent episode, severe ([place])  Secondary Diagnoses: Principal Problem:   MDD (major depressive disorder), recurrent episode, severe ([place]) Active Problems:   Borderline personality disorder ([place])   Autism spectrum disorder   Suicidal ideations   Asthma   Seizure disorder ([place])   Carrier of fragile X syndrome   Overdose   Current Medications:  Current Facility-Administered Medications  Medication Dose Route Frequency Provider Last Rate Last Admin  . albuterol (VENTOLIN HFA) 108 (90 Base) MCG/ACT inhaler 1-2 puff  1-2 puff Inhalation Q6H PRN Ethelene Hal, NP   2 puff at 01/21/20 0957  . alum & mag hydroxide-simeth (MAALOX/MYLANTA) 200-200-20 MG/5ML suspension 30 mL  30 mL Oral Q6H PRN Ethelene Hal, NP      . benzocaine (ORAJEL) 10 % mucosal gel   Mouth/Throat QID PRN Ethelene Hal, NP   Given at 01/21/20 1700  . FLUoxetine (PROZAC) capsule 20 mg  20 mg Oral Daily Ethelene Hal, NP   20 mg at 01/22/20 0815  . hydrOXYzine (ATARAX/VISTARIL) tablet 25 mg  25 mg Oral TID PRN Mallie Darting, NP   25 mg at 01/21/20 2209  . LORazepam (ATIVAN) tablet 1 mg  1 mg Oral Q4H PRN Cristofano, Dorene Ar, MD   1 mg at 01/20/20 1333  . magnesium hydroxide (MILK OF MAGNESIA) suspension 30 mL  30 mL Oral Daily PRN Ethelene Hal, NP      . mometasone-formoterol [facility]) 200-5 MCG/ACT inhaler 2 puff  2 puff Inhalation BID Ethelene Hal, NP   2 puff at 01/22/20 [phone]  . ondansetron (ZOFRAN) tablet 8 mg  8 mg Oral Q8H PRN Ethelene Hal, NP   8 mg at 01/22/20 0815  . pantoprazole (PROTONIX) EC tablet 40 mg  40 mg Oral Daily Ethelene Hal, NP   40 mg at 01/22/20 0815  . risperiDONE (RISPERDAL) tablet 3 mg  3 mg Oral BID Cristofano, Dorene Ar, MD   3 mg at 01/22/20 0815   . SUMAtriptan (IMITREX) tablet 100 mg  100 mg Oral Q2H PRN Ethelene Hal, NP   100 mg at 01/20/20 1352  . traZODone (DESYREL) tablet 200 mg  200 mg Oral QHS Ethelene Hal, NP   200 mg at 01/21/20 2207   PTA Medications: Medications Prior to Admission  Medication Sig Dispense Refill Last Dose  . albuterol (PROVENTIL HFA;VENTOLIN HFA) 108 (90 Base) MCG/ACT inhaler Inhale 1-2 puffs into the lungs every 6 (six) hours as needed for wheezing or shortness of breath.     . busPIRone (BUSPAR) 10 MG tablet Take 1 tablet (10 mg total) by mouth 3 (three) times daily. 90 tablet 0   . fluticasone-salmeterol (ADVAIR HFA) 115-21 MCG/ACT inhaler Inhale 2 puffs into the lungs 2 (two) times daily. (Patient not taking: No sig reported) 1 Inhaler 5   . hydrOXYzine (ATARAX/VISTARIL) 25 MG tablet Take 1 tablet (25 mg total) by mouth every 6 (six) hours as needed for anxiety. (Patient not taking: Reported on 01/14/2020) 30 tablet 0   . ondansetron (ZOFRAN) 4 MG tablet Take 1 tablet (4 mg total) by mouth every 8 (eight) hours as needed for nausea or vomiting. 12 tablet 0   . pantoprazole (PROTONIX) 40 MG tablet Take 1 tablet (40 mg total) by mouth daily. 30 tablet 0   . risperiDONE (RISPERDAL) 2 MG tablet  Take 1 tablet (2 mg total) by mouth in the morning. 30 tablet 0   . risperiDONE (RISPERDAL) 3 MG tablet Take 1 tablet (3 mg total) by mouth at bedtime. 30 tablet 0   . sertraline (ZOLOFT) 50 MG tablet Take 3 tablets (150 mg total) by mouth daily. 90 tablet 0   . sucralfate (CARAFATE) 1 g tablet Take 1 g by mouth 4 (four) times daily.     . traZODone (DESYREL) 100 MG tablet Take 2 tablets (200 mg total) by mouth at bedtime as needed for sleep. 60 tablet 0     Patient Stressors: Financial difficulties Health problems Medication change or noncompliance  Patient Strengths: Agricultural engineer for treatment/growth Physical Health Supportive family/friends  Treatment Modalities: Medication  Management, Group therapy, Case management,  1 to 1 session with clinician, Psychoeducation, Recreational therapy.   Physician Treatment Plan for Primary Diagnosis: MDD (major depressive disorder), recurrent episode, severe ([place]) Long Term Goal(s): Improvement in symptoms so as ready for discharge Improvement in symptoms so as ready for discharge   Short Term Goals: Ability to identify changes in lifestyle to reduce recurrence of condition will improve Ability to disclose and discuss suicidal ideas Ability to identify and develop effective coping behaviors will improve Compliance with prescribed medications will improve Ability to identify triggers associated with substance abuse/mental health issues will improve Ability to identify changes in lifestyle to reduce recurrence of condition will improve Ability to verbalize feelings will improve Ability to disclose and discuss suicidal ideas Ability to identify and develop effective coping behaviors will improve Compliance with prescribed medications will improve Ability to identify triggers associated with substance abuse/mental health issues will improve  Medication Management: Evaluate patient's response, side effects, and tolerance of medication regimen.  Therapeutic Interventions: 1 to 1 sessions, Unit Group sessions and Medication administration.  Evaluation of Outcomes: Adequate for Discharge  Physician Treatment Plan for Secondary Diagnosis: Principal Problem:   MDD (major depressive disorder), recurrent episode, severe ([name]) Active Problems:   Borderline personality disorder ([place])   Autism spectrum disorder   Suicidal ideations   Asthma   Seizure disorder ([place])   Carrier of fragile X syndrome   Overdose  Long Term Goal(s): Improvement in symptoms so as ready for discharge Improvement in symptoms so as ready for discharge   Short Term Goals: Ability to identify changes in lifestyle to reduce recurrence of condition will  improve Ability to disclose and discuss suicidal ideas Ability to identify and develop effective coping behaviors will improve Compliance with prescribed medications will improve Ability to identify triggers associated with substance abuse/mental health issues will improve Ability to identify changes in lifestyle to reduce recurrence of condition will improve Ability to verbalize feelings will improve Ability to disclose and discuss suicidal ideas Ability to identify and develop effective coping behaviors will improve Compliance with prescribed medications will improve Ability to identify triggers associated with substance abuse/mental health issues will improve     Medication Management: Evaluate patient's response, side effects, and tolerance of medication regimen.  Therapeutic Interventions: 1 to 1 sessions, Unit Group sessions and Medication administration.  Evaluation of Outcomes: Adequate for Discharge   RN Treatment Plan for Primary Diagnosis: MDD (major depressive disorder), recurrent episode, severe ([place]) Long Term Goal(s): Knowledge of disease and therapeutic regimen to maintain health will improve  Short Term Goals: Ability to remain free from injury will improve, Ability to participate in decision making will improve, Ability to verbalize feelings will improve, Ability to disclose and discuss suicidal  ideas and Ability to identify and develop effective coping behaviors will improve  Medication Management: RN will administer medications as ordered by provider, will assess and evaluate patient's response and provide education to patient for prescribed medication. RN will report any adverse and/or side effects to prescribing provider.  Therapeutic Interventions: 1 on 1 counseling sessions, Psychoeducation, Medication administration, Evaluate responses to treatment, Monitor vital signs and CBGs as ordered, Perform/monitor CIWA, COWS, AIMS and Fall Risk screenings as ordered, Perform  wound care treatments as ordered.  Evaluation of Outcomes: Adequate for Discharge   LCSW Treatment Plan for Primary Diagnosis: MDD (major depressive disorder), recurrent episode, severe ([place]) Long Term Goal(s): Safe transition to appropriate next level of care at discharge, Engage patient in therapeutic group addressing interpersonal concerns.  Short Term Goals: Engage patient in aftercare planning with referrals and resources, Increase social support, Increase emotional regulation, Facilitate acceptance of mental health diagnosis and concerns, Identify triggers associated with mental health/substance abuse issues and Increase skills for wellness and recovery  Therapeutic Interventions: Assess for all discharge needs, 1 to 1 time with Social worker, Explore available resources and support systems, Assess for adequacy in community support network, Educate family and significant other(s) on suicide prevention, Complete Psychosocial Assessment, Interpersonal group therapy.  Evaluation of Outcomes: Adequate for Discharge   Progress in Treatment: Attending groups: No. Participating in groups: No. Taking medication as prescribed: Yes. Toleration medication: Yes. Family/Significant other contact made: No, will contact:  [place]  Patient understands diagnosis: Yes. and No. Discussing patient identified problems/goals with staff: Yes. Medical problems stabilized or resolved: Yes. Denies suicidal/homicidal ideation: Yes. Issues/concerns per patient self-inventory: No.   New problem(s) identified: No, Describe:  None   New Short Term/Long Term Goal(s): medication stabilization, elimination of SI thoughts, development of comprehensive mental wellness plan.   Patient Goals:  "To go home"  Discharge Plan or Barriers: Patient will return home and will follow up with [facility].  Reason for Continuation of Hospitalization: Medication stabilization  Estimated Length of Stay: Adequate  for discharge    Attendees: Patient: [name]  01/22/2020   Physician: Lala Lund, MD 01/22/2020   Nursing:  01/22/2020   RN Care Manager: 01/22/2020   Social Worker: Verdis Frederickson, [place] 01/22/2020   Recreational Therapist:  01/22/2020   Other:  01/22/2020  Other:  01/22/2020   Other: 01/22/2020     Scribe for Treatment Team: Darleen Crocker, [place] 01/22/2020 9:27 AM

## 2020-01-22 NOTE — Progress Notes (Signed)
Discharge Note:  Patient denies SI/HI AVH at this time. Discharge instructions, AVS, prescriptions and transition record gone over with patient. Patient agrees to comply with medication management, follow-up visit, and outpatient therapy. Patient belongings returned to patient. Patient questions and concerns addressed and answered.  Patient ambulatory off unit.  Patient discharged to home.   

## 2020-01-22 NOTE — Discharge Summary (Signed)
Physician Discharge Summary Note  Patient:  [name] is an 29 y.o., female  MRN:  [id number]  DOB:  1990-10-07  Patient phone:  [phone] (home)   Patient address:   [place] [address],   Total Time spent with patient: Greater than 30 minutes  Date of Admission:  01/17/2020  Date of Discharge: 01-22-20  Reason for Admission: Suicide attempt by overdose on her home medications.  Principal Problem: MDD (major depressive disorder), recurrent episode, severe ([place])  Discharge Diagnoses: Principal Problem:   MDD (major depressive disorder), recurrent episode, severe ([place]) Active Problems:   Borderline personality disorder ([place])   Autism spectrum disorder   Suicidal ideations   Asthma   Seizure disorder ([place])   Carrier of fragile X syndrome   Overdose  Past Psychiatric History: Bipolar 1 disorder, recurrent mania.  Past Medical History:  Past Medical History:  Diagnosis Date  . Acid reflux   . Anxiety   . Asthma    last attack 03/13/15 or 03/14/15  . Autism   . 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 Chronic constipation   . Depression   . Drug-seeking behavior   . Essential tremor   . Headache   . Overdose of acetaminophen 07/2017   and other meds  . Personality disorder ([place])   . Schizo-affective psychosis ([place])   . Schizoaffective disorder, bipolar type ([place])   . Seizures [facility])    Last seizure December 2017  . Sleep apnea     Past Surgical History:  Procedure Laterality Date  . MOUTH SURGERY  2009 or 2010   Family History:  Family History  Problem Relation Age of Onset  . Mental illness Father   . Asthma Father   . PDD Brother   . Seizures Brother    Family Psychiatric  History: See H&P  Social History:  Social History   Substance and Sexual Activity  Alcohol Use No  . Alcohol/week: 1.0 standard drink  . Types: 1 Standard drinks or equivalent per week   Comment: denies at this time     Social History    Substance and Sexual Activity  Drug Use No   Comment: History of cocaine use at age 59 for 4 months    Social History   Socioeconomic History  . Marital status: Widowed    Spouse name: Not on file  . Number of children: 0  . Years of education: Not on file  . Highest education level: Not on file  Occupational History  . Occupation: disability  Tobacco Use  . Smoking status: Former Smoker    Packs/day: 0.00  . Smokeless tobacco: Never Used  . Tobacco comment: Smoked for 2  years age [age]  Vaping Use  . Vaping Use: Never used  Substance and Sexual Activity  . Alcohol use: No    Alcohol/week: 1.0 standard drink    Types: 1 Standard drinks or equivalent per week    Comment: denies at this time  . Drug use: No    Comment: History of cocaine use at age 16 for 4 months  . Sexual activity: Yes    Birth control/protection: None  Other Topics Concern  . Not on file  Social History Narrative   Marital status: Widowed      Children: daughter      Lives: with boyfriend, in two story home      Employment:  Disability      Tobacco: quit smoking; smoked  for two years.      Alcohol ;none      Drugs: none   Has not traveled outside of the country.   Right handed         Social Determinants of Health   Financial Resource Strain: Not on file  Food Insecurity: Not on file  Transportation Needs: Not on file  Physical Activity: Not on file  Stress: Not on file  Social Connections: Not on file   Hospital Course: (Per Md's admission notes): [name] is a 29 year old female who was admitted to [facility] from [facility] after an overdose on her home medications. She is well known to this facility. She has a psychiatric history significant for reported schizoaffective disorder, bipolar type, Borderline Personality Disorder, PTSD, and chronic migraines. She was seen in her room shortly after admission. She is in no apparent distress and was snoring when this provider entered the room. She is  very difficult to understand due to her rapid, pressured speech. She stated she took "a bunch" of sertraline, buspar, risperidone, Imitrex, hydroxyzine, Zofran, protonix, and sucralfate. Of note, these are her home medications, which she stated she takes "on and off." She stated she stops taking her medication when she feels better. She is currently living with her boyfriend and stated they have been arguing "a little" off and on." She stated she took the pills, did not vomit, got scared and called EMS. She arrived at the emergency room approximately 1 hour after ingestion. She stated she was trying to end her life but does not feel that way now. She is requesting her medications be restarted while she is here.   This is one of several psychiatric admission/discharge summaries in this [facility] for this 29 year old Caucasian female with hx of chronic mental & multiple psychiatric admissions. She is known in this [facility] & other psychiatric hospitals within the surrounding areas for worsening symptoms of her mental illness, suicidal ideations  & multiple suicide attempts. She has been tried on multiple psychotropic medications for her symptoms & it appears nothing has actually been helpful in stabilizing her symptoms because Kirstine does not always adhere to her treatment recommendations. She was brought to the [facility] this time around for evaluation & treatment after an overdose attempt on her home medications.  After evaluation of her presenting symptoms, [name] was recommended for mood stabilization treatments. The medication regimen for her presenting symptoms were discussed & with her consent initiated. She received, stabilized & was discharged on the medications as listed below on her discharge medication lists. She was also enrolled & participated in the group counseling sessions being offered & held on this unit. She learned coping skills. She presented on this admission, other chronic medical conditions that required  treatment & monitoring. She was resumed/discharged on all her pertinent home medications for those health issues. She tolerated her treatment regimen without any adverse effects or reactions reported.  On her admission day during her present hospital stay, Nonie was noted to be talkative, tangential as well as circumstantial. However, she denies any symptoms of psychosis. She presented very disorganized in her speech & thinking pattern & yet very difficult to redirect. On this day of her hospital discharge, Kynedi to some extent remained tangential & disorganized, but aware of situation & able to make concrete decisions. She did say that she is feeling a lot better more so knowing that she is back on her medications she knows will help her the best. She maintained  that she is neither suicidal, homicidal nor having any hallucinations, delusions or paranoia. She denies any symptoms of depression or anxiety. She presented mentally & medically stable for discharge.  During the course of this hospitalization, the 15-minute checks were adequate to ensure Adra's safety.  Patient did not display any dangerous, violent or suicidal behavior on the unit.  She interacted with staff appropriately. She participated in the group sessions/therapies. Her medications were addressed & adjusted to meet her needs. She was recommended for outpatient follow-up care & medication management upon discharge to assure her continuity of care.  At the time of discharge patient is not reporting any acute suicidal/homicidal ideations, hallucinations, delusions or paranoia. She feels more confident about her self-care & in managing her mental illness. She currently denies any new issues or concerns. Education and supportive counseling provided throughout her hospital stay & upon discharge.  Today upon her discharge evaluation with the attending psychiatrist, Giabella shares she is doing well. She denies any other specific concerns. She is  sleeping well. Her appetite is good. She denies other physical complaints. She denies AH/VH. She feels that her medications have been helpful & is in agreement to continue her current treatment regimen as recommended. She was able to engage in safety planning including plan to return to [facility] or contact emergency services if she feels unable to maintain her own safety or the safety of others. Pt had no further questions, comments, or concerns. She left [facility] with all personal belongings in no apparent distress. Transportation per the [employer].  Physical Findings: AIMS: Facial and Oral Movements Muscles of Facial Expression: None, normal Lips and Perioral Area: None, normal Jaw: None, normal Tongue: None, normal,Extremity Movements Upper (arms, wrists, hands, fingers): None, normal Lower (legs, knees, ankles, toes): None, normal, Trunk Movements Neck, shoulders, hips: None, normal, Overall Severity Severity of abnormal movements (highest score from questions above): None, normal Incapacitation due to abnormal movements: None, normal Patient's awareness of abnormal movements (rate only patient's report): No Awareness, Dental Status Current problems with teeth and/or dentures?: No Does patient usually wear dentures?: No  CIWA:    COWS:     Musculoskeletal: Strength & Muscle Tone: within normal limits Gait & Station: normal Patient leans: N/A  Psychiatric Specialty Exam: Physical Exam Vitals and nursing note reviewed.  Constitutional:      Appearance: She is well-developed.  HENT:     Head: Normocephalic.     Nose: Nose normal.     Mouth/Throat:     Pharynx: Oropharynx is clear.  Eyes:     Pupils: Pupils are equal, round, and reactive to light.  Cardiovascular:     Rate and Rhythm: Normal rate.     Pulses: Normal pulses.  Pulmonary:     Effort: Pulmonary effort is normal.  Abdominal:     Palpations: Abdomen is soft.  Genitourinary:    Comments:  Deferred Musculoskeletal:        General: Normal range of motion.     Cervical back: Normal range of motion.  Skin:    General: Skin is warm and dry.  Neurological:     General: No focal deficit present.     Mental Status: She is alert and oriented to person, place, and time.     Review of Systems  Constitutional: Negative for chills, diaphoresis and fever.  HENT: Negative for congestion, rhinorrhea, sneezing and sore throat.   Eyes: Negative for discharge.  Respiratory: Negative for cough, shortness of breath and wheezing.  Cardiovascular: Negative for chest pain and palpitations.  Gastrointestinal: Negative for diarrhea, nausea and vomiting.  Endocrine: Negative for cold intolerance.  Genitourinary: Negative for difficulty urinating.  Musculoskeletal: Negative.  Negative for arthralgias and myalgias.  Skin: Negative.   Allergic/Immunologic: Positive for food allergies (Coconut & shell fish allergies). Negative for immunocompromised state.       Allergies:  Coconut  Geodon  Haloperidol A Lithium  Oxycodone  Quetiapine   Seroquel  Shellfish  Phenergan. Prilosec (omeprazole) Medium Nausea And Vomiting  Sulfa Antibiotics Medium Other (See Comments) Chest pain  Tegretol (carbamazepine) Medium Nausea And Vomiting  Tape       Neurological: Negative for dizziness, tremors, seizures, syncope, facial asymmetry, speech difficulty, weakness, light-headedness, numbness and headaches.  Psychiatric/Behavioral: Positive for dysphoric mood (Hx of (Stabilized with medication prior  to discharge)) and sleep disturbance (Stabilized with medication prior to discharge). Negative for agitation, behavioral problems, confusion, decreased concentration, hallucinations, self-injury and suicidal ideas. The patient is not nervous/anxious (Stable) and is not hyperactive.     Blood pressure (!) 143/81, pulse (!) 101, temperature 97.9 F (36.6 C), temperature source Oral, resp. rate 18, height 5\' 4"   (1.626 m), weight 96.2 kg, SpO2 100 %.Body mass index is 36.39 kg/m.  See Md's discharge SRA  Sleep:  Number of Hours: 5.75   Have you used any form of tobacco in the last 30 days? (Cigarettes, Smokeless Tobacco, Cigars, and/or Pipes): No  Has this patient used any form of tobacco in the last 30 days? (Cigarettes, Smokeless Tobacco, Cigars, and/or Pipes): N/A  Blood Alcohol level:  Lab Results  Component Value Date   ETH <10 01/15/2020   ETH <10 34/74/2595   Metabolic Disorder Labs:  Lab Results  Component Value Date   HGBA1C 5.0 07/02/2019   MPG 96.8 07/02/2019   MPG 91.06 03/06/2019   Lab Results  Component Value Date   PROLACTIN 129.0 (H) 01/19/2020   PROLACTIN 128.0 (H) 01/18/2020   Lab Results  Component Value Date   CHOL 173 01/18/2020   TRIG 159 (H) 01/18/2020   HDL 49 01/18/2020   CHOLHDL 3.5 01/18/2020   VLDL 32 01/18/2020   LDLCALC 92 01/18/2020   LDLCALC 122 (H) 07/02/2019   See Psychiatric Specialty Exam and Suicide Risk Assessment completed by Attending Physician prior to discharge.  Discharge destination:  Home  Is patient on multiple antipsychotic therapies at discharge:  No   Has Patient had three or more failed trials of antipsychotic monotherapy by history:  No  Recommended Plan for Multiple Antipsychotic Therapies: NA  Allergies as of 01/22/2020      Reactions   Bee Venom Anaphylaxis   Coconut Flavor Anaphylaxis, Rash   Geodon [ziprasidone Hcl] Other (See Comments)   Pt states that this medication causes paralysis of the mouth.     Haloperidol And Related Other (See Comments)   Pt states that this medication causes paralysis of the mouth.     Lithium Other (See Comments)   Reaction:  Seizure-like activity.    Oxycodone Other (See Comments)   Reaction:  Hallucinations    Quetiapine Other (See Comments)   Pt states that this medication is too strong.    Seroquel [quetiapine Fumarate] Other (See Comments)   Pt states that this medication is  too strong.    Shellfish Allergy Anaphylaxis   Phenergan [promethazine Hcl] Other (See Comments)   Reaction:  Chest pain    Prilosec [omeprazole] Nausea And Vomiting   Sulfa Antibiotics Other (See Comments)  Chest pain    Tegretol [carbamazepine] Nausea And Vomiting   Tape Other (See Comments)   Skin tears, can only tolerate paper tape.      Medication List    STOP taking these medications   busPIRone 10 MG tablet Commonly known as: BUSPAR   sertraline 50 MG tablet Commonly known as: ZOLOFT   sucralfate 1 g tablet Commonly known as: CARAFATE     TAKE these medications     Indication  albuterol 108 (90 Base) MCG/ACT inhaler Commonly known as: VENTOLIN HFA Inhale 1-2 puffs into the lungs every 6 (six) hours as needed for wheezing or shortness of breath.  Indication: Asthma   benzocaine 10 % mucosal gel Commonly known as: ORAJEL Use as directed in the mouth or throat 4 (four) times daily as needed for mouth pain.  Indication: Toothache   FLUoxetine 20 MG capsule Commonly known as: PROZAC Take 1 capsule (20 mg total) by mouth daily. For depression Start taking on: January 23, 2020  Indication: Depressive Phase of Manic-Depression   fluticasone-salmeterol 115-21 MCG/ACT inhaler Commonly known as: Advair HFA Inhale 2 puffs into the lungs 2 (two) times daily.  Indication: Asthma   hydrOXYzine 25 MG tablet Commonly known as: ATARAX/VISTARIL Take 1 tablet (25 mg total) by mouth 3 (three) times daily as needed for anxiety. What changed: when to take this  Indication: Feeling Anxious   mometasone-formoterol 200-5 MCG/ACT Aero Commonly known as: DULERA Inhale 2 puffs into the lungs 2 (two) times daily. For shortness of breath  Indication: Asthma   ondansetron 4 MG tablet Commonly known as: ZOFRAN Take 1 tablet (4 mg total) by mouth every 8 (eight) hours as needed for nausea or vomiting.  Indication: Nausea and Vomiting   pantoprazole 40 MG tablet Commonly known  as: PROTONIX Take 1 tablet (40 mg total) by mouth daily.  Indication: Gastroesophageal Reflux Disease   risperiDONE 3 MG tablet Commonly known as: RISPERDAL Take 1 tablet (3 mg total) by mouth 2 (two) times daily. For mood control What changed:   medication strength  how much to take  when to take this  additional instructions  Another medication with the same name was removed. Continue taking this medication, and follow the directions you see here.  Indication: Mood control   traZODone 100 MG tablet Commonly known as: DESYREL Take 2 tablets (200 mg total) by mouth at bedtime. For sleep What changed:   when to take this  reasons to take this  additional instructions  Indication: [place] Team Follow up.   Why: Continue to follow-up with your ACTT Team for medication management and therapy.  Contact information: [phone] (Regular)             Follow-up recommendations:  Activity:  As tolerated Diet: As recommended by your primary care doctor. Keep all scheduled follow-up appointments as recommended.  Comments: Prescriptions given at discharge.  Patient agreeable to plan.  Given opportunity to ask questions.  Appears to feel comfortable with discharge denies any current suicidal or homicidal thought. Patient is also instructed prior to discharge to: Take all medications as prescribed by his/her mental healthcare provider. Report any adverse effects and or reactions from the medicines to his/her outpatient provider promptly. Patient has been instructed & cautioned: To not engage in alcohol and or illegal drug use while on prescription medicines. In the event of worsening symptoms, patient is instructed to call the  crisis hotline, 911 and or go to the nearest ED for appropriate evaluation and treatment of symptoms. To follow-up with his/her primary care provider for your other medical issues, concerns and or  health care needs.  Signed: Lindell Spar, NP, PMHNP, FNP-BC 01/22/2020, 12:26 PM

## 2020-01-22 NOTE — [facility] Group Notes (Signed)
Pt did not attend morning goals group. 

## 2020-01-23 ENCOUNTER — Other Ambulatory Visit: Payer: Self-pay

## 2020-01-23 ENCOUNTER — Encounter (HOSPITAL_COMMUNITY): Payer: Self-pay | Admitting: Emergency Medicine

## 2020-01-23 ENCOUNTER — Emergency Department (HOSPITAL_COMMUNITY)
Admission: EM | Admit: 2020-01-23 | Discharge: 2020-01-23 | Disposition: A | Discharge status: Home / Self Care | Payer: PPO | Attending: Emergency Medicine | Admitting: Emergency Medicine

## 2020-01-23 ENCOUNTER — Encounter (HOSPITAL_COMMUNITY): Payer: Self-pay

## 2020-01-23 ENCOUNTER — Ambulatory Visit (HOSPITAL_COMMUNITY)
Admission: EM | Admit: 2020-01-23 | Discharge: 2020-01-24 | Disposition: A | Discharge status: Home / Self Care | Payer: PPO | Attending: Behavioral Health | Admitting: Behavioral Health

## 2020-01-23 DIAGNOSIS — Z87891 Personal history of nicotine dependence: Secondary | ICD-10-CM | POA: Insufficient documentation

## 2020-01-23 DIAGNOSIS — J45909 Unspecified asthma, uncomplicated: Secondary | ICD-10-CM | POA: Diagnosis not present

## 2020-01-23 DIAGNOSIS — F329 Major depressive disorder, single episode, unspecified: Secondary | ICD-10-CM | POA: Diagnosis not present

## 2020-01-23 DIAGNOSIS — Z20822 Contact with and (suspected) exposure to covid-19: Secondary | ICD-10-CM | POA: Diagnosis not present

## 2020-01-23 DIAGNOSIS — F332 Major depressive disorder, recurrent severe without psychotic features: Secondary | ICD-10-CM

## 2020-01-23 DIAGNOSIS — R45851 Suicidal ideations: Secondary | ICD-10-CM | POA: Diagnosis not present

## 2020-01-23 LAB — COMPREHENSIVE METABOLIC PANEL
ALT: 17 U/L (ref 0–44)
AST: 17 U/L (ref 15–41)
Albumin: 3.9 g/dL (ref 3.5–5.0)
Alkaline Phosphatase: 58 U/L (ref 38–126)
Anion gap: 11 (ref 5–15)
BUN: 11 mg/dL (ref 6–20)
CO2: 22 mmol/L (ref 22–32)
Calcium: 9 mg/dL (ref 8.9–10.3)
Chloride: 106 mmol/L (ref 98–111)
Creatinine, Ser: 0.77 mg/dL (ref 0.44–1.00)
GFR, Estimated: >60 mL/min (ref >60)
Glucose, Bld: 100 mg/dL — ABNORMAL HIGH (ref 70–99)
Potassium: 3.7 mmol/L (ref 3.5–5.1)
Sodium: 139 mmol/L (ref 135–145)
Total Bilirubin: 0.4 mg/dL (ref 0.3–1.2)
Total Protein: 6.7 g/dL (ref 6.5–8.1)

## 2020-01-23 LAB — CBC
HCT: 38.1 % (ref 36.0–46.0)
Hemoglobin: 12.1 g/dL (ref 12.0–15.0)
MCH: 26.5 pg (ref 26.0–34.0)
MCHC: 31.8 g/dL (ref 30.0–36.0)
MCV: 83.4 fL (ref 80.0–100.0)
Platelets: 251 10*3/uL (ref 150–400)
RBC: 4.57 MIL/uL (ref 3.87–5.11)
RDW: 13.3 % (ref 11.5–15.5)
WBC: 7.2 10*3/uL (ref 4.0–10.5)
nRBC: 0 % (ref 0.0–0.2)

## 2020-01-23 LAB — POCT URINE DRUG SCREEN - MANUAL ENTRY (I-SCREEN)
POC Amphetamine UR: NOT DETECTED
POC Buprenorphine (BUP): NOT DETECTED
POC Cocaine UR: NOT DETECTED
POC Marijuana UR: NOT DETECTED
POC Methadone UR: NOT DETECTED
POC Methamphetamine UR: NOT DETECTED
POC Morphine: NOT DETECTED
POC Oxazepam (BZO): NOT DETECTED
POC Oxycodone UR: NOT DETECTED
POC Secobarbital (BAR): NOT DETECTED

## 2020-01-23 LAB — SALICYLATE LEVEL: Salicylate Lvl: <7 mg/dL — ABNORMAL LOW (ref 7.0–30.0)

## 2020-01-23 LAB — RESP PANEL BY RT-PCR (FLU A&B, COVID) ARPGX2
Influenza A by PCR: NEGATIVE
Influenza B by PCR: NEGATIVE
SARS Coronavirus 2 by RT PCR: NEGATIVE

## 2020-01-23 LAB — ETHANOL: Alcohol, Ethyl (B): <10 mg/dL (ref <10)

## 2020-01-23 LAB — POC SARS CORONAVIRUS 2 AG -  ED: SARS Coronavirus 2 Ag: NEGATIVE

## 2020-01-23 LAB — I-STAT BETA HCG BLOOD, ED (MC, WL, AP ONLY): I-stat hCG, quantitative: <5 m[IU]/mL (ref <5)

## 2020-01-23 LAB — ACETAMINOPHEN LEVEL: Acetaminophen (Tylenol), Serum: <10 ug/mL — ABNORMAL LOW (ref 10–30)

## 2020-01-23 MED ORDER — ACETAMINOPHEN 325 MG PO TABS: 650.0000 mg | ORAL_TABLET | Freq: Four times a day (QID) | ORAL | Status: DC | PRN

## 2020-01-23 MED ORDER — MOMETASONE FURO-FORMOTEROL FUM 200-5 MCG/ACT IN AERO
2.0000 | INHALATION_SPRAY | Freq: Two times a day (BID) | RESPIRATORY_TRACT | Status: DC
  Administered 2020-01-23: 2 via RESPIRATORY_TRACT
  Filled 2020-01-23: qty 8.8

## 2020-01-23 MED ORDER — HYDROXYZINE HCL 25 MG PO TABS
25.0000 mg | ORAL_TABLET | Freq: Three times a day (TID) | ORAL | Status: DC | PRN
  Administered 2020-01-23: 25 mg via ORAL
  Filled 2020-01-23 (×2): qty 1

## 2020-01-23 MED ORDER — ALUM & MAG HYDROXIDE-SIMETH 200-200-20 MG/5ML PO SUSP: 30.0000 mL | ORAL | Status: DC | PRN

## 2020-01-23 MED ORDER — FLUOXETINE HCL 20 MG PO CAPS
20.0000 mg | ORAL_CAPSULE | Freq: Every day | ORAL | Status: DC
  Filled 2020-01-23: qty 1

## 2020-01-23 MED ORDER — IBUPROFEN 200 MG PO TABS: 600.0000 mg | ORAL_TABLET | Freq: Once | ORAL | Status: DC

## 2020-01-23 MED ORDER — TRAZODONE HCL 100 MG PO TABS
200.0000 mg | ORAL_TABLET | Freq: Every day | ORAL | Status: DC
Start: 2020-01-23 — End: 2020-01-24
  Administered 2020-01-23: 200 mg via ORAL
  Filled 2020-01-23: qty 2

## 2020-01-23 MED ORDER — RISPERIDONE 3 MG PO TABS
3.0000 mg | ORAL_TABLET | Freq: Two times a day (BID) | ORAL | Status: DC
  Administered 2020-01-23: 3 mg via ORAL
  Filled 2020-01-23 (×2): qty 1

## 2020-01-23 MED ORDER — MAGNESIUM HYDROXIDE 400 MG/5ML PO SUSP: 30.0000 mL | Freq: Every day | ORAL | Status: DC | PRN

## 2020-01-23 MED ORDER — SUMATRIPTAN SUCCINATE 50 MG PO TABS
50.0000 mg | ORAL_TABLET | Freq: Once | ORAL | Status: AC
  Administered 2020-01-23: 22:00:00 50 mg via ORAL
  Filled 2020-01-23: qty 1

## 2020-01-23 MED ORDER — SUMATRIPTAN SUCCINATE 50 MG PO TABS: 50.0000 mg | ORAL_TABLET | Freq: Two times a day (BID) | ORAL | Status: DC | PRN

## 2020-01-23 NOTE — ED Provider Notes (Signed)
Behavioral Health Admission H&P [facility] & OBS)  Date: 01/23/20 Patient Name: Vinetta Brach MRN: [id number] Chief Complaint: No chief complaint on file.     Diagnoses: Borderline personality disorder ([place]) Suicidal ideation  HPI: Cierah Crader is a 29 year old female presenting voluntarily to [facility] due to requesting a medication change and for SI with a plan to overdose on pills or to cut herself with a knife. The patient resided alone and reported having pills and a knife in the home. Due to attempted overdose, the patient was recently discharged from [facility] length of stay from 01/17/2020 to 01/22/2020. When asked about medications, the patient stated, " I am on Prozac and high dose of Risperdal, it too high." The patient reported no suicide attempts since being discharged from [facility] and reported self-harming behaviors of scratching her arms. The patient disclosed she has a 46 y/o daughter who lives with her paternal grandmother. The patient revealed that her husband attempted suicide in 2018, which was a success. She currently lives alone and does not have much support. She disclosed she has a court order for her daughter, but her mother-in-law does not follow the orders. Therefore, she does not see her daughter. The patient is disheveled, alert, and oriented x4. The patient speaks clearly, at moderate volume, and normal pace. Motor behavior appears normal. Eye contact is good. The patient's mood is depressed, and her affect is congruent with mood. The thought process is coherent and relevant. There is no indication the patient is currently responding to internal stimuli or experiencing delusional thought content. The patient was cooperative throughout the assessment. He is requesting inpatient treatment for mental health and substance use.  The patient will remain under observation overnight and reassessed in the A.M. to determine if she meets the criteria for psychiatric inpatient  admission or can be discharged.  PHQ 2-9:  [place] ED from 07/26/2019 in [facility] Office Visit from 08/20/2016 in [place] for [place] from 04/03/2016 in [place] for [facility]  Thoughts that you would be better off dead, or of hurting yourself in some way Nearly every day Not at all Not at all  PHQ-9 Total Score 15 1 16       Flowsheet Row Admission (Discharged) from 01/17/2020 in [place] [facility] ED from 01/15/2020 in [place] DEPT Admission (Discharged) from 07/08/2019 in [place] [facility]  C-SSRS RISK CATEGORY High Risk High Risk High Risk       Total Time spent with patient: 20 minutes  Musculoskeletal  Strength & Muscle Tone: within normal limits Gait & Station: normal Patient leans: N/A  Psychiatric Specialty Exam  Presentation General Appearance: Disheveled  Eye Contact:Fair  Speech:Normal Rate  Speech Volume:Normal  Handedness:Right   Mood and Affect  Mood:Depressed  Affect:Blunt; Congruent; Depressed   Thought Process  Thought Processes:Coherent  Descriptions of Associations:Intact  Orientation:Full (Time, Place and Person)  Thought Content:Logical  Hallucinations:Hallucinations: None  Ideas of Reference:None  Suicidal Thoughts:Suicidal Thoughts: Yes, Active SI Active Intent and/or Plan: Without Intent; With Plan; With Means to Carry Out; With Access to Means SI Passive Intent and/or Plan: With Intent; With Plan  Homicidal Thoughts:Homicidal Thoughts: No   Sensorium  Memory:Immediate Good; Recent Good; Remote Good  Judgment:Intact  Insight:Fair   Executive Functions  Concentration:Fair  Attention Span:Fair  Recall:Good  Fund of Knowledge:Good  Language:Fair   Psychomotor Activity  Psychomotor Activity:Psychomotor Activity: Normal  Assets   Assets:Desire for Improvement; Housing; Leisure Time; Social Support   Sleep  Sleep:Sleep: Fair   Physical Exam Vitals and nursing note reviewed.  Constitutional:      Appearance: Normal appearance. She is normal weight.  HENT:     Right Ear: External ear normal.     Left Ear: External ear normal.     Nose: Nose normal.     Mouth/Throat:     Mouth: Mucous membranes are moist.     Pharynx: Oropharynx is clear.  Cardiovascular:     Rate and Rhythm: Tachycardia present.  Pulmonary:     Effort: Pulmonary effort is normal.  Musculoskeletal:        General: Normal range of motion.     Cervical back: Normal range of motion and neck supple.  Neurological:     General: No focal deficit present.     Mental Status: She is alert and oriented to person, place, and time. Mental status is at baseline.  Psychiatric:        Thought Content: Thought content normal.    Review of Systems  Psychiatric/Behavioral: Positive for depression and suicidal ideas. The patient is nervous/anxious.     Blood pressure (!) 142/122, pulse (!) 126, temperature 98.7 F (37.1 C), temperature source Temporal, resp. rate 20, height 5\' 4"  (1.626 m), weight 78 kg, SpO2 99 %. Body mass index is 29.52 kg/m.  Past Psychiatric History:    Is the patient at risk to self? Yes  Has the patient been a risk to self in the past 6 months? Yes .    Has the patient been a risk to self within the distant past? No   Is the patient a risk to others? No   Has the patient been a risk to others in the past 6 months? No   Has the patient been a risk to others within the distant past? No   Past Medical History:  Past Medical History:  Diagnosis Date  . Acid reflux   . Anxiety   . Asthma    last attack 03/13/15 or 03/14/15  . Autism   . 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 Chronic constipation   . Depression   . Drug-seeking behavior   . Essential tremor   . Headache   . Overdose of acetaminophen 07/2017   and other meds   . Personality disorder ([place])   . Schizo-affective psychosis ([place])   . Schizoaffective disorder, bipolar type ([place])   . Seizures [facility])    Last seizure December 2017  . Sleep apnea     Past Surgical History:  Procedure Laterality Date  . MOUTH SURGERY  2009 or 2010    Family History:  Family History  Problem Relation Age of Onset  . Mental illness Father   . Asthma Father   . PDD Brother   . Seizures Brother     Social History:  Social History   Socioeconomic History  . Marital status: Widowed    Spouse name: Not on file  . Number of children: 0  . Years of education: Not on file  . Highest education level: Not on file  Occupational History  . Occupation: disability  Tobacco Use  . Smoking status: Former Smoker    Packs/day: 0.00  . Smokeless tobacco: Never Used  . Tobacco comment: Smoked for 2  years age [age]  Vaping Use  . Vaping Use: Never used  Substance and Sexual Activity  . Alcohol use:  No    Alcohol/week: 1.0 standard drink    Types: 1 Standard drinks or equivalent per week    Comment: denies at this time  . Drug use: No    Comment: History of cocaine use at age 84 for 4 months  . Sexual activity: Yes    Birth control/protection: None  Other Topics Concern  . Not on file  Social History Narrative   Marital status: Widowed      Children: daughter      Lives: with boyfriend, in two story home      Employment:  Disability      Tobacco: quit smoking; smoked for two years.      Alcohol ;none      Drugs: none   Has not traveled outside of the country.   Right handed         Social Determinants of Health   Financial Resource Strain: Not on file  Food Insecurity: Not on file  Transportation Needs: Not on file  Physical Activity: Not on file  Stress: Not on file  Social Connections: Not on file  Intimate Partner Violence: Not on file    SDOH:  SDOH Screenings   Alcohol Screen: Low Risk   . Last Alcohol Screening Score (AUDIT): 0   Depression (PHQ2-9): Medium Risk  . PHQ-2 Score: 15  Financial Resource Strain: Not on file  Food Insecurity: Not on file  Housing: Not on file  Physical Activity: Not on file  Social Connections: Not on file  Stress: Not on file  Tobacco Use: Medium Risk  . Smoking Tobacco Use: Former Smoker  . Smokeless Tobacco Use: Never Used  Transportation Needs: Not on file    Last Labs:  Admission on 01/23/2020, Discharged on 01/23/2020  Component Date Value Ref Range Status  . Sodium 01/23/2020 139  135 - 145 mmol/L Final  . Potassium 01/23/2020 3.7  3.5 - 5.1 mmol/L Final  . Chloride 01/23/2020 106  98 - 111 mmol/L Final  . CO2 01/23/2020 22  22 - 32 mmol/L Final  . Glucose, Bld 01/23/2020 100* 70 - 99 mg/dL Final   Glucose reference range applies only to samples taken after fasting for at least 8 hours.  . BUN 01/23/2020 11  6 - 20 mg/dL Final  . Creatinine, Ser 01/23/2020 0.77  0.44 - 1.00 mg/dL Final  . Calcium 01/23/2020 9.0  8.9 - 10.3 mg/dL Final  . Total Protein 01/23/2020 6.7  6.5 - 8.1 g/dL Final  . Albumin 01/23/2020 3.9  3.5 - 5.0 g/dL Final  . AST 01/23/2020 17  15 - 41 U/L Final  . ALT 01/23/2020 17  0 - 44 U/L Final  . Alkaline Phosphatase 01/23/2020 58  38 - 126 U/L Final  . Total Bilirubin 01/23/2020 0.4  0.3 - 1.2 mg/dL Final  . GFR, Estimated 01/23/2020 >60  >60 mL/min Final   Comment: (NOTE) Calculated using the CKD-EPI Creatinine Equation (2021)   . Anion gap 01/23/2020 11  5 - 15 Final   Performed at [facility], [place] [address], [place], [place] [address]  . Alcohol, Ethyl (B) 01/23/2020 <10  <10 mg/dL Final   Comment: (NOTE) Lowest detectable limit for serum alcohol is 10 mg/dL.  For medical purposes only. Performed at [facility], [place] [address], [place], [place] [address]   . Salicylate Lvl 82/64/1583 <7.0* 7.0 - 30.0 mg/dL Final   Performed at [facility] [address] [place], [place]  [address]  . Acetaminophen (Tylenol), Serum 01/23/2020 <10* 10 - 30 ug/mL Final   Comment: (NOTE) Therapeutic concentrations vary significantly. A range of 10-30 ug/mL  may be an effective concentration for many patients. However, some  are best treated at concentrations outside of this range. Acetaminophen concentrations >150 ug/mL at 4 hours after ingestion  and >50 ug/mL at 12 hours after ingestion are often associated with  toxic reactions.  Performed at [facility], [place] [address], [place], [place] [address]   . WBC 01/23/2020 7.2  4.0 - 10.5 K/uL Final  . RBC 01/23/2020 4.57  3.87 - 5.11 MIL/uL Final  . Hemoglobin 01/23/2020 12.1  12.0 - 15.0 g/dL Final  . HCT 01/23/2020 38.1  36.0 - 46.0 % Final  . MCV 01/23/2020 83.4  80.0 - 100.0 fL Final  . MCH 01/23/2020 26.5  26.0 - 34.0 pg Final  . MCHC 01/23/2020 31.8  30.0 - 36.0 g/dL Final  . RDW 01/23/2020 13.3  11.5 - 15.5 % Final  . Platelets 01/23/2020 251  150 - 400 K/uL Final  . nRBC 01/23/2020 0.0  0.0 - 0.2 % Final   Performed at [facility], [place] [address] [place], [place] [address]  . I-stat hCG, quantitative 01/23/2020 <5.0  <5 mIU/mL Final  . Comment 3 01/23/2020          Final   Comment:   GEST. AGE      CONC.  (mIU/mL)   <=1 WEEK        5 - 50     2 WEEKS       50 - 500     3 WEEKS       100 - 10,000     4 WEEKS     1,000 - 30,000        FEMALE AND NON-PREGNANT FEMALE:     LESS THAN 5 mIU/mL   . SARS Coronavirus 2 by RT PCR 01/23/2020 NEGATIVE  NEGATIVE Final   Comment: (NOTE) SARS-CoV-2 target nucleic acids are NOT DETECTED.  The SARS-CoV-2 RNA is generally detectable in upper respiratory specimens during the acute phase of infection. The lowest concentration of SARS-CoV-2 viral copies this assay can detect is 138 copies/mL. A negative result does not preclude SARS-Cov-2 infection and should not be used as the sole basis for treatment or other patient management decisions. A  negative result may occur with  improper specimen collection/handling, submission of specimen other than nasopharyngeal swab, presence of viral mutation(s) within the areas targeted by this assay, and inadequate number of viral copies(<138 copies/mL). A negative result must be combined with clinical observations, patient history, and epidemiological information. The expected result is Negative.  Fact Sheet for Patients:  EntrepreneurPulse.com.au  Fact Sheet for Healthcare Providers:  IncredibleEmployment.be  This test is no                          t yet approved or cleared by the [place] FDA and  has been authorized for detection and/or diagnosis of SARS-CoV-2 by FDA under an Emergency Use Authorization (EUA). This EUA will remain  in effect (meaning this test can be used) for the duration of the COVID-19 declaration under Section 564(b)(1) of the Act, 21 U.S.C.section 360bbb-3(b)(1), unless the authorization is terminated  or revoked sooner.      . Influenza A by PCR 01/23/2020 NEGATIVE  NEGATIVE Final  . Influenza B by PCR 01/23/2020 NEGATIVE  NEGATIVE Final   Comment: (NOTE) The Xpert Xpress SARS-CoV-2/FLU/RSV plus assay is intended as an aid in the diagnosis of influenza from Nasopharyngeal swab specimens and should not be used as a sole basis for treatment. Nasal washings and aspirates are unacceptable for Xpert Xpress SARS-CoV-2/FLU/RSV testing.  Fact Sheet for Patients: EntrepreneurPulse.com.au  Fact Sheet for Healthcare Providers: IncredibleEmployment.be  This test is not yet approved or cleared by the [place] FDA and has been authorized for detection and/or diagnosis of SARS-CoV-2 by FDA under an Emergency Use Authorization (EUA). This EUA will remain in effect (meaning this test can be used) for the duration of the COVID-19 declaration under Section 564(b)(1) of the Act, 21  U.S.C. section 360bbb-3(b)(1), unless the authorization is terminated or revoked.  Performed at [facility], [place] [address], [place], [place] [address]   Admission on 01/17/2020, Discharged on 01/22/2020  Component Date Value Ref Range Status  . Cholesterol 01/18/2020 173  0 - 200 mg/dL Final  . Triglycerides 01/18/2020 159* <150 mg/dL Final  . HDL 01/18/2020 49  >40 mg/dL Final  . Total CHOL/HDL Ratio 01/18/2020 3.5  RATIO Final  . VLDL 01/18/2020 32  0 - 40 mg/dL Final  . LDL Cholesterol 01/18/2020 92  0 - 99 mg/dL Final   Comment:        Total Cholesterol/HDL:CHD Risk Coronary Heart Disease Risk Table                     Men   Women  1/2 Average Risk   3.4   3.3  Average Risk       5.0   4.4  2 X Average Risk   9.6   7.1  3 X Average Risk  23.4   11.0        Use the calculated Patient Ratio above and the CHD Risk Table to determine the patient's CHD Risk.        ATP III CLASSIFICATION (LDL):  <100     mg/dL   Optimal  100-129  mg/dL   Near or Above                    Optimal  130-159  mg/dL   Borderline  160-189  mg/dL   High  >190     mg/dL   Very High Performed at [facility] [address] [place], [place] [address]   . Free T4 01/18/2020 0.97  0.61 - 1.12 ng/dL Final   Comment: (NOTE) Biotin ingestion may interfere with free T4 tests. If the results are inconsistent with the TSH level, previous test results, or the clinical presentation, then consider biotin interference. If needed, order repeat testing after stopping biotin. Performed at [name], [place] [address] [place], [place] [address]   . TSH 01/18/2020 2.470  0.350 - 4.500 uIU/mL Final   Comment: Performed by a 3rd Generation assay with a functional sensitivity of <=0.01 uIU/mL. Performed at [facility], [place] [address], [place], [place] [address]   . Prolactin 01/18/2020 128.0* 4.8 - 23.3 ng/mL Final   Comment: (NOTE) Performed  At: [facility] [place], [place] [id number] Rush Farmer MD [id number]   . Prolactin 01/19/2020 129.0* 4.8 - 23.3 ng/mL Final   Comment: (NOTE) Performed At: [facility] [place], [place] [id number] Rush Farmer MD [id number]   Admission on 01/15/2020, Discharged on 01/17/2020  Component Date Value Ref Range  Status  . Color, Urine 01/15/2020 COLORLESS* YELLOW Final  . APPearance 01/15/2020 CLEAR  CLEAR Final  . Specific Gravity, Urine 01/15/2020 1.001* 1.005 - 1.030 Final  . pH 01/15/2020 6.0  5.0 - 8.0 Final  . Glucose, UA 01/15/2020 NEGATIVE  NEGATIVE mg/dL Final  . Hgb urine dipstick 01/15/2020 SMALL* NEGATIVE Final  . Bilirubin Urine 01/15/2020 NEGATIVE  NEGATIVE Final  . Ketones, ur 01/15/2020 NEGATIVE  NEGATIVE mg/dL Final  . Protein, ur 01/15/2020 NEGATIVE  NEGATIVE mg/dL Final  . Nitrite 01/15/2020 NEGATIVE  NEGATIVE Final  . [place] Guest 01/15/2020 NEGATIVE  NEGATIVE Final  . Bacteria, UA 01/15/2020 NONE SEEN  NONE SEEN Final   Performed at [facility] [address] [place], [place] [address]  . WBC 01/15/2020 7.4  4.0 - 10.5 K/uL Final  . RBC 01/15/2020 4.96  3.87 - 5.11 MIL/uL Final  . Hemoglobin 01/15/2020 13.1  12.0 - 15.0 g/dL Final  . HCT 01/15/2020 41.3  36.0 - 46.0 % Final  . MCV 01/15/2020 83.3  80.0 - 100.0 fL Final  . MCH 01/15/2020 26.4  26.0 - 34.0 pg Final  . MCHC 01/15/2020 31.7  30.0 - 36.0 g/dL Final  . RDW 01/15/2020 13.7  11.5 - 15.5 % Final  . Platelets 01/15/2020 230  150 - 400 K/uL Final  . nRBC 01/15/2020 0.0  0.0 - 0.2 % Final  . Neutrophils Relative % 01/15/2020 61  % Final  . Neutro Abs 01/15/2020 4.4  1.7 - 7.7 K/uL Final  . Lymphocytes Relative 01/15/2020 34  % Final  . Lymphs Abs 01/15/2020 2.5  0.7 - 4.0 K/uL Final  . Monocytes Relative 01/15/2020 5  % Final  . Monocytes Absolute 01/15/2020 0.4  0.1 - 1.0 K/uL Final  . Eosinophils Relative 01/15/2020 0  %  Final  . Eosinophils Absolute 01/15/2020 0.0  0.0 - 0.5 K/uL Final  . Basophils Relative 01/15/2020 0  % Final  . Basophils Absolute 01/15/2020 0.0  0.0 - 0.1 K/uL Final  . Immature Granulocytes 01/15/2020 0  % Final  . Abs Immature Granulocytes 01/15/2020 0.02  0.00 - 0.07 K/uL Final   Performed at [facility], [place] [address], [place],  [address]  . Sodium 01/15/2020 140  135 - 145 mmol/L Final  . Potassium 01/15/2020 3.6  3.5 - 5.1 mmol/L Final  . Chloride 01/15/2020 107  98 - 111 mmol/L Final  . CO2 01/15/2020 21* 22 - 32 mmol/L Final  . Glucose, Bld 01/15/2020 101* 70 - 99 mg/dL Final   Glucose reference range applies only to samples taken after fasting for at least 8 hours.  . BUN 01/15/2020 7  6 - 20 mg/dL Final  . Creatinine, Ser 01/15/2020 0.78  0.44 - 1.00 mg/dL Final  . Calcium 01/15/2020 9.6  8.9 - 10.3 mg/dL Final  . Total Protein 01/15/2020 7.9  6.5 - 8.1 g/dL Final  . Albumin 01/15/2020 4.6  3.5 - 5.0 g/dL Final  . AST 01/15/2020 20  15 - 41 U/L Final  . ALT 01/15/2020 20  0 - 44 U/L Final  . Alkaline Phosphatase 01/15/2020 66  38 - 126 U/L Final  . Total Bilirubin 01/15/2020 0.6  0.3 - 1.2 mg/dL Final  . GFR, Estimated 01/15/2020 >60  >60 mL/min Final   Comment: (NOTE) Calculated using the CKD-EPI Creatinine Equation (2021)   . Anion gap 01/15/2020 12  5 - 15 Final   Performed at [facility], [place]  [address], [place], [place] [address]  . Alcohol, Ethyl (B) 01/15/2020 <10  <10 mg/dL Final   Comment: (NOTE) Lowest detectable limit for serum alcohol is 10 mg/dL.  For medical purposes only. Performed at [facility], [place] [address] [place], [place] [address]   . Salicylate Lvl 42/70/6237 <7.0* 7.0 - 30.0 mg/dL Final   Performed at [place] [address] [place], [place] [address]  . Acetaminophen (Tylenol), Serum 01/15/2020 <10* 10 - 30 ug/mL Final   Comment:  (NOTE) Therapeutic concentrations vary significantly. A range of 10-30 ug/mL  may be an effective concentration for many patients. However, some  are best treated at concentrations outside of this range. Acetaminophen concentrations >150 ug/mL at 4 hours after ingestion  and >50 ug/mL at 12 hours after ingestion are often associated with  toxic reactions.  Performed at [facility], [place] [address], [place], [place] [address]   . Opiates 01/15/2020 NONE DETECTED  NONE DETECTED Final  . Cocaine 01/15/2020 NONE DETECTED  NONE DETECTED Final  . Benzodiazepines 01/15/2020 NONE DETECTED  NONE DETECTED Final  . Amphetamines 01/15/2020 NONE DETECTED  NONE DETECTED Final  . Tetrahydrocannabinol 01/15/2020 NONE DETECTED  NONE DETECTED Final  . Barbiturates 01/15/2020 NONE DETECTED  NONE DETECTED Final   Comment: (NOTE) DRUG SCREEN FOR MEDICAL PURPOSES ONLY.  IF CONFIRMATION IS NEEDED FOR ANY PURPOSE, NOTIFY LAB WITHIN 5 DAYS.  LOWEST DETECTABLE LIMITS FOR URINE DRUG SCREEN Drug Class                     Cutoff (ng/mL) Amphetamine and metabolites    1000 Barbiturate and metabolites    200 Benzodiazepine                 607 Tricyclics and metabolites     300 Opiates and metabolites        300 Cocaine and metabolites        300 THC                            50 Performed at [facility], [place] [address], [place], [place] [address]   . Magnesium 01/16/2020 1.9  1.7 - 2.4 mg/dL Final   Performed at [place] [address], [place],  [address]  . SARS Coronavirus 2 by RT PCR 01/16/2020 NEGATIVE  NEGATIVE Final   Comment: (NOTE) SARS-CoV-2 target nucleic acids are NOT DETECTED.  The SARS-CoV-2 RNA is generally detectable in upper respiratory specimens during the acute phase of infection. The lowest concentration of SARS-CoV-2 viral copies this assay can detect is 138 copies/mL. A negative result does not preclude  SARS-Cov-2 infection and should not be used as the sole basis for treatment or other patient management decisions. A negative result may occur with  improper specimen collection/handling, submission of specimen other than nasopharyngeal swab, presence of viral mutation(s) within the areas targeted by this assay, and inadequate number of viral copies(<138 copies/mL). A negative result must be combined with clinical observations, patient history, and epidemiological information. The expected result is Negative.  Fact Sheet for Patients:  EntrepreneurPulse.com.au  Fact Sheet for Healthcare Providers:  IncredibleEmployment.be  This test is no                          t yet approved or cleared by the [place] and  has been authorized  for detection and/or diagnosis of SARS-CoV-2 by FDA under an Emergency Use Authorization (EUA). This EUA will remain  in effect (meaning this test can be used) for the duration of the COVID-19 declaration under Section 564(b)(1) of the Act, 21 U.S.C.section 360bbb-3(b)(1), unless the authorization is terminated  or revoked sooner.      . Influenza A by PCR 01/16/2020 NEGATIVE  NEGATIVE Final  . Influenza B by PCR 01/16/2020 NEGATIVE  NEGATIVE Final   Comment: (NOTE) The Xpert Xpress SARS-CoV-2/FLU/RSV plus assay is intended as an aid in the diagnosis of influenza from Nasopharyngeal swab specimens and should not be used as a sole basis for treatment. Nasal washings and aspirates are unacceptable for Xpert Xpress SARS-CoV-2/FLU/RSV testing.  Fact Sheet for Patients: EntrepreneurPulse.com.au  Fact Sheet for Healthcare Providers: IncredibleEmployment.be  This test is not yet approved or cleared by the [place] FDA and has been authorized for detection and/or diagnosis of SARS-CoV-2 by FDA under an Emergency Use Authorization (EUA). This EUA will remain in effect  (meaning this test can be used) for the duration of the COVID-19 declaration under Section 564(b)(1) of the Act, 21 U.S.C. section 360bbb-3(b)(1), unless the authorization is terminated or revoked.  Performed at [facility], [place] [address] [place], [place] [address]   . I-stat hCG, quantitative 01/16/2020 <5.0  <5 mIU/mL Final  . Comment 3 01/16/2020          Final   Comment:   GEST. AGE      CONC.  (mIU/mL)   <=1 WEEK        5 - 50     2 WEEKS       50 - 500     3 WEEKS       100 - 10,000     4 WEEKS     1,000 - 30,000        FEMALE AND NON-PREGNANT FEMALE:     LESS THAN 5 mIU/mL   . Sodium 01/16/2020 141  135 - 145 mmol/L Final  . Potassium 01/16/2020 3.8  3.5 - 5.1 mmol/L Final  . Chloride 01/16/2020 112* 98 - 111 mmol/L Final  . CO2 01/16/2020 17* 22 - 32 mmol/L Final  . Glucose, Bld 01/16/2020 114* 70 - 99 mg/dL Final   Glucose reference range applies only to samples taken after fasting for at least 8 hours.  . BUN 01/16/2020 7  6 - 20 mg/dL Final  . Creatinine, Ser 01/16/2020 0.84  0.44 - 1.00 mg/dL Final  . Calcium 01/16/2020 8.1* 8.9 - 10.3 mg/dL Final  . GFR, Estimated 01/16/2020 >60  >60 mL/min Final   Comment: (NOTE) Calculated using the CKD-EPI Creatinine Equation (2021)   . Anion gap 01/16/2020 12  5 - 15 Final   Performed at [place] [facility], [place] [address] [place], [place] [address]  . Magnesium 01/16/2020 1.9  1.7 - 2.4 mg/dL Final   Performed at [place] [address], [place], [place] [address]  Admission on 01/13/2020, Discharged on 01/14/2020  Component Date Value Ref Range Status  . SARS Coronavirus 2 by RT PCR 01/14/2020 NEGATIVE  NEGATIVE Final   Comment: (NOTE) SARS-CoV-2 target nucleic acids are NOT DETECTED.  The SARS-CoV-2 RNA is generally detectable in upper respiratory specimens during the acute phase of infection. The lowest concentration of SARS-CoV-2 viral copies this assay can  detect is 138 copies/mL. A negative result does not preclude SARS-Cov-2 infection and should not be used as  the sole basis for treatment or other patient management decisions. A negative result may occur with  improper specimen collection/handling, submission of specimen other than nasopharyngeal swab, presence of viral mutation(s) within the areas targeted by this assay, and inadequate number of viral copies(<138 copies/mL). A negative result must be combined with clinical observations, patient history, and epidemiological information. The expected result is Negative.  Fact Sheet for Patients:  EntrepreneurPulse.com.au  Fact Sheet for Healthcare Providers:  IncredibleEmployment.be  This test is no                          t yet approved or cleared by the [place] FDA and  has been authorized for detection and/or diagnosis of SARS-CoV-2 by FDA under an Emergency Use Authorization (EUA). This EUA will remain  in effect (meaning this test can be used) for the duration of the COVID-19 declaration under Section 564(b)(1) of the Act, 21 U.S.C.section 360bbb-3(b)(1), unless the authorization is terminated  or revoked sooner.      . Influenza A by PCR 01/14/2020 NEGATIVE  NEGATIVE Final  . Influenza B by PCR 01/14/2020 NEGATIVE  NEGATIVE Final   Comment: (NOTE) The Xpert Xpress SARS-CoV-2/FLU/RSV plus assay is intended as an aid in the diagnosis of influenza from Nasopharyngeal swab specimens and should not be used as a sole basis for treatment. Nasal washings and aspirates are unacceptable for Xpert Xpress SARS-CoV-2/FLU/RSV testing.  Fact Sheet for Patients: EntrepreneurPulse.com.au  Fact Sheet for Healthcare Providers: IncredibleEmployment.be  This test is not yet approved or cleared by the [place] FDA and has been authorized for detection and/or diagnosis of SARS-CoV-2 by FDA under an Emergency  Use Authorization (EUA). This EUA will remain in effect (meaning this test can be used) for the duration of the COVID-19 declaration under Section 564(b)(1) of the Act, 21 U.S.C. section 360bbb-3(b)(1), unless the authorization is terminated or revoked.  Performed at [facility], [place] [address], [place], [place] [address]   . SARS Coronavirus 2 Ag 01/14/2020 Negative  Negative Preliminary  . POC Amphetamine UR 01/14/2020 None Detected  None Detected Preliminary  . POC Secobarbital (BAR) 01/14/2020 None Detected  None Detected Preliminary  . POC Buprenorphine (BUP) 01/14/2020 None Detected  None Detected Preliminary  . POC Oxazepam (BZO) 01/14/2020 None Detected  None Detected Preliminary  . POC Cocaine UR 01/14/2020 None Detected  None Detected Preliminary  . POC Methamphetamine UR 01/14/2020 None Detected  None Detected Preliminary  . POC Morphine 01/14/2020 None Detected  None Detected Preliminary  . POC Oxycodone UR 01/14/2020 None Detected  None Detected Preliminary  . POC Methadone UR 01/14/2020 None Detected  None Detected Preliminary  . POC Marijuana UR 01/14/2020 None Detected  None Detected Preliminary  . Preg Test, Ur 01/14/2020 NEGATIVE  NEGATIVE Final   Comment:        THE SENSITIVITY OF THIS METHODOLOGY IS >24 mIU/mL   Admission on 01/13/2020, Discharged on 01/13/2020  Component Date Value Ref Range Status  . Lipase 01/13/2020 38  11 - 51 U/L Final   Performed at [facility], [place] [address] [place], [place] [address]  . Sodium 01/13/2020 139  135 - 145 mmol/L Final  . Potassium 01/13/2020 3.7  3.5 - 5.1 mmol/L Final  . Chloride 01/13/2020 107  98 - 111 mmol/L Final  . CO2 01/13/2020 22  22 - 32 mmol/L Final  . Glucose, Bld 01/13/2020 94  70 - 99 mg/dL Final  Glucose reference range applies only to samples taken after fasting for at least 8 hours.  . BUN 01/13/2020 11  6 - 20 mg/dL Final  . Creatinine, Ser 01/13/2020 0.70  0.44 - 1.00 mg/dL Final  .  Calcium 01/13/2020 9.4  8.9 - 10.3 mg/dL Final  . Total Protein 01/13/2020 7.0  6.5 - 8.1 g/dL Final  . Albumin 01/13/2020 4.2  3.5 - 5.0 g/dL Final  . AST 01/13/2020 21  15 - 41 U/L Final  . ALT 01/13/2020 17  0 - 44 U/L Final  . Alkaline Phosphatase 01/13/2020 65  38 - 126 U/L Final  . Total Bilirubin 01/13/2020 0.3  0.3 - 1.2 mg/dL Final  . GFR, Estimated 01/13/2020 >60  >60 mL/min Final   Comment: (NOTE) Calculated using the CKD-EPI Creatinine Equation (2021)   . Anion gap 01/13/2020 10  5 - 15 Final   Performed at [facility], [place] [address] [place], [place] [address]  . WBC 01/13/2020 7.6  4.0 - 10.5 K/uL Final  . RBC 01/13/2020 4.77  3.87 - 5.11 MIL/uL Final  . Hemoglobin 01/13/2020 12.7  12.0 - 15.0 g/dL Final  . HCT 01/13/2020 39.2  36.0 - 46.0 % Final  . MCV 01/13/2020 82.2  80.0 - 100.0 fL Final  . MCH 01/13/2020 26.6  26.0 - 34.0 pg Final  . MCHC 01/13/2020 32.4  30.0 - 36.0 g/dL Final  . RDW 01/13/2020 13.6  11.5 - 15.5 % Final  . Platelets 01/13/2020 271  150 - 400 K/uL Final  . nRBC 01/13/2020 0.0  0.0 - 0.2 % Final   Performed at [facility], [place] [address] [place], [place] [address]  . I-stat hCG, quantitative 01/13/2020 <5.0  <5 mIU/mL Final  . Comment 3 01/13/2020          Final   Comment:   GEST. AGE      CONC.  (mIU/mL)   <=1 WEEK        5 - 50     2 WEEKS       50 - 500     3 WEEKS       100 - 10,000     4 WEEKS     1,000 - 30,000        FEMALE AND NON-PREGNANT FEMALE:     LESS THAN 5 mIU/mL   Admission on 01/08/2020, Discharged on 01/08/2020  Component Date Value Ref Range Status  . Lipase 01/08/2020 42  11 - 51 U/L Final   Performed at [facility], [place] [address] [place], [place] [address]  . Sodium 01/08/2020 137  135 - 145 mmol/L Final  . Potassium 01/08/2020 4.0  3.5 - 5.1 mmol/L Final  . Chloride 01/08/2020 108  98 - 111 mmol/L Final  . CO2 01/08/2020 20* 22 - 32 mmol/L Final  . Glucose, Bld 01/08/2020 103* 70 -  99 mg/dL Final   Glucose reference range applies only to samples taken after fasting for at least 8 hours.  . BUN 01/08/2020 7  6 - 20 mg/dL Final  . Creatinine, Ser 01/08/2020 0.72  0.44 - 1.00 mg/dL Final  . Calcium 01/08/2020 9.2  8.9 - 10.3 mg/dL Final  . Total Protein 01/08/2020 8.0  6.5 - 8.1 g/dL Final  . Albumin 01/08/2020 4.6  3.5 - 5.0 g/dL Final  . AST 01/08/2020 22  15 - 41 U/L Final  . ALT 01/08/2020 20  0 - 44 U/L  Final  . Alkaline Phosphatase 01/08/2020 67  38 - 126 U/L Final  . Total Bilirubin 01/08/2020 0.3  0.3 - 1.2 mg/dL Final  . GFR, Estimated 01/08/2020 >60  >60 mL/min Final   Comment: (NOTE) Calculated using the CKD-EPI Creatinine Equation (2021)   . Anion gap 01/08/2020 9  5 - 15 Final   Performed at [facility], [place] [address] [place], [place] [address]  . WBC 01/08/2020 7.0  4.0 - 10.5 K/uL Final  . RBC 01/08/2020 5.01  3.87 - 5.11 MIL/uL Final  . Hemoglobin 01/08/2020 13.3  12.0 - 15.0 g/dL Final  . HCT 01/08/2020 41.6  36.0 - 46.0 % Final  . MCV 01/08/2020 83.0  80.0 - 100.0 fL Final  . MCH 01/08/2020 26.5  26.0 - 34.0 pg Final  . MCHC 01/08/2020 32.0  30.0 - 36.0 g/dL Final  . RDW 01/08/2020 13.8  11.5 - 15.5 % Final  . Platelets 01/08/2020 268  150 - 400 K/uL Final  . nRBC 01/08/2020 0.0  0.0 - 0.2 % Final   Performed at [facility], [place] [address] [place], [place] [address]  . Color, Urine 01/08/2020 YELLOW  YELLOW Final  . APPearance 01/08/2020 CLEAR  CLEAR Final  . Specific Gravity, Urine 01/08/2020 1.020  1.005 - 1.030 Final  . pH 01/08/2020 5.0  5.0 - 8.0 Final  . Glucose, UA 01/08/2020 NEGATIVE  NEGATIVE mg/dL Final  . Hgb urine dipstick 01/08/2020 SMALL* NEGATIVE Final  . Bilirubin Urine 01/08/2020 NEGATIVE  NEGATIVE Final  . Ketones, ur 01/08/2020 NEGATIVE  NEGATIVE mg/dL Final  . Protein, ur 01/08/2020 NEGATIVE  NEGATIVE mg/dL Final  . Nitrite 01/08/2020 NEGATIVE  NEGATIVE Final  . Chalmers Guest  01/08/2020 NEGATIVE  NEGATIVE Final  . RBC / HPF 01/08/2020 0-5  0 - 5 RBC/hpf Final  . WBC, UA 01/08/2020 0-5  0 - 5 WBC/hpf Final  . Bacteria, UA 01/08/2020 RARE* NONE SEEN Final   Performed at [facility], [place] [address] [place], [place] [address]  . Preg, Serum 01/08/2020 NEGATIVE  NEGATIVE Final   Comment:        THE SENSITIVITY OF THIS METHODOLOGY IS >10 mIU/mL. Performed at [facility], [place] [address] [place], [place] [address]   . Glucose-Capillary 01/08/2020 97  70 - 99 mg/dL Final   Glucose reference range applies only to samples taken after fasting for at least 8 hours.  Admission on 01/04/2020, Discharged on 01/04/2020  Component Date Value Ref Range Status  . I-stat hCG, quantitative 01/04/2020 <5.0  <5 mIU/mL Final  . Comment 3 01/04/2020          Final   Comment:   GEST. AGE      CONC.  (mIU/mL)   <=1 WEEK        5 - 50     2 WEEKS       50 - 500     3 WEEKS       100 - 10,000     4 WEEKS     1,000 - 30,000        FEMALE AND NON-PREGNANT FEMALE:     LESS THAN 5 mIU/mL   Admission on 08/20/2019, Discharged on 08/20/2019  Component Date Value Ref Range Status  . Sodium 08/20/2019 138  135 - 145 mmol/L Final  . Potassium 08/20/2019 3.4* 3.5 - 5.1 mmol/L Final  . Chloride 08/20/2019 105  98 - 111 mmol/L Final  .  CO2 08/20/2019 24  22 - 32 mmol/L Final  . Glucose, Bld 08/20/2019 118* 70 - 99 mg/dL Final   Glucose reference range applies only to samples taken after fasting for at least 8 hours.  . BUN 08/20/2019 8  6 - 20 mg/dL Final  . Creatinine, Ser 08/20/2019 0.76  0.44 - 1.00 mg/dL Final  . Calcium 08/20/2019 9.4  8.9 - 10.3 mg/dL Final  . GFR calc non Af Amer 08/20/2019 >60  >60 mL/min Final  . GFR calc Af Amer 08/20/2019 >60  >60 mL/min Final  . Anion gap 08/20/2019 9  5 - 15 Final   Performed at [facility], [place] [address], [place], [place] [address]  . WBC 08/20/2019 5.6  4.0 - 10.5 K/uL Final  . RBC 08/20/2019  4.88  3.87 - 5.11 MIL/uL Final  . Hemoglobin 08/20/2019 12.4  12.0 - 15.0 g/dL Final  . HCT 08/20/2019 39.9  36.0 - 46.0 % Final  . MCV 08/20/2019 81.8  80.0 - 100.0 fL Final  . MCH 08/20/2019 25.4* 26.0 - 34.0 pg Final  . MCHC 08/20/2019 31.1  30.0 - 36.0 g/dL Final  . RDW 08/20/2019 12.6  11.5 - 15.5 % Final  . Platelets 08/20/2019 207  150 - 400 K/uL Final  . nRBC 08/20/2019 0.0  0.0 - 0.2 % Final   Performed at [facility], [place] [address] [place], [place] [address]  . I-stat hCG, quantitative 08/20/2019 <5.0  <5 mIU/mL Final  . Comment 3 08/20/2019          Final   Comment:   GEST. AGE      CONC.  (mIU/mL)   <=1 WEEK        5 - 50     2 WEEKS       50 - 500     3 WEEKS       100 - 10,000     4 WEEKS     1,000 - 30,000        FEMALE AND NON-PREGNANT FEMALE:     LESS THAN 5 mIU/mL   Admission on 08/02/2019, Discharged on 08/03/2019  Component Date Value Ref Range Status  . WBC 08/02/2019 10.9* 4.0 - 10.5 K/uL Final  . RBC 08/02/2019 4.55  3.87 - 5.11 MIL/uL Final  . Hemoglobin 08/02/2019 12.4  12.0 - 15.0 g/dL Final  . HCT 08/02/2019 38.1  36.0 - 46.0 % Final  . MCV 08/02/2019 83.7  80.0 - 100.0 fL Final  . MCH 08/02/2019 27.3  26.0 - 34.0 pg Final  . MCHC 08/02/2019 32.5  30.0 - 36.0 g/dL Final  . RDW 08/02/2019 12.7  11.5 - 15.5 % Final  . Platelets 08/02/2019 220  150 - 400 K/uL Final  . nRBC 08/02/2019 0.0  0.0 - 0.2 % Final   Performed at [facility], [place] [address] [place], [place] [address]  . Sodium 08/02/2019 139  135 - 145 mmol/L Final  . Potassium 08/02/2019 3.7  3.5 - 5.1 mmol/L Final  . Chloride 08/02/2019 105  98 - 111 mmol/L Final  . CO2 08/02/2019 22  22 - 32 mmol/L Final  . Glucose, Bld 08/02/2019 95  70 - 99 mg/dL Final   Glucose reference range applies only to samples taken after fasting for at least 8 hours.  . BUN 08/02/2019 7  6 - 20 mg/dL Final  . Creatinine, Ser 08/02/2019 0.66  0.44 - 1.00 mg/dL Final  .  Calcium  08/02/2019 8.9  8.9 - 10.3 mg/dL Final  . Total Protein 08/02/2019 7.2  6.5 - 8.1 g/dL Final  . Albumin 08/02/2019 4.3  3.5 - 5.0 g/dL Final  . AST 08/02/2019 19  15 - 41 U/L Final  . ALT 08/02/2019 14  0 - 44 U/L Final  . Alkaline Phosphatase 08/02/2019 62  38 - 126 U/L Final  . Total Bilirubin 08/02/2019 0.3  0.3 - 1.2 mg/dL Final  . GFR calc non Af Amer 08/02/2019 >60  >60 mL/min Final  . GFR calc Af Amer 08/02/2019 >60  >60 mL/min Final  . Anion gap 08/02/2019 12  5 - 15 Final   Performed at [facility], [place] [address] [place], [place] [address]  . Acetaminophen (Tylenol), Serum 08/02/2019 <10* 10 - 30 ug/mL Final   Comment: (NOTE) Therapeutic concentrations vary significantly. A range of 10-30 ug/mL  may be an effective concentration for many patients. However, some  are best treated at concentrations outside of this range. Acetaminophen concentrations >150 ug/mL at 4 hours after ingestion  and >50 ug/mL at 12 hours after ingestion are often associated with  toxic reactions.  Performed at [facility], [place] [address] [place], [place] [address]   . Salicylate Lvl 29/52/8413 <7.0* 7.0 - 30.0 mg/dL Final   Performed at [place] [address] [place], [place] [address]  . Alcohol, Ethyl (B) 08/02/2019 <10  <10 mg/dL Final   Comment: (NOTE) Lowest detectable limit for serum alcohol is 10 mg/dL.  For medical purposes only. Performed at [facility], [place] [address], [place], [place] [address]   . Opiates 08/02/2019 NONE DETECTED  NONE DETECTED Final  . Cocaine 08/02/2019 NONE DETECTED  NONE DETECTED Final  . Benzodiazepines 08/02/2019 NONE DETECTED  NONE DETECTED Final  . Amphetamines 08/02/2019 NONE DETECTED  NONE DETECTED Final  . Tetrahydrocannabinol 08/02/2019 NONE DETECTED  NONE DETECTED Final  . Barbiturates 08/02/2019 NONE DETECTED  NONE DETECTED Final   Comment: (NOTE) DRUG SCREEN FOR  MEDICAL PURPOSES ONLY.  IF CONFIRMATION IS NEEDED FOR ANY PURPOSE, NOTIFY LAB WITHIN 5 DAYS.  LOWEST DETECTABLE LIMITS FOR URINE DRUG SCREEN Drug Class                     Cutoff (ng/mL) Amphetamine and metabolites    1000 Barbiturate and metabolites    200 Benzodiazepine                 366 Tricyclics and metabolites     300 Opiates and metabolites        300 Cocaine and metabolites        300 THC                            50 Performed at [facility], [place] [address], [place], [place] [address]   . I-stat hCG, quantitative 08/02/2019 <5.0  <5 mIU/mL Final  . Comment 3 08/02/2019          Final   Comment:   GEST. AGE      CONC.  (mIU/mL)   <=1 WEEK        5 - 50     2 WEEKS       50 - 500     3 WEEKS       100 - 10,000     4 WEEKS  1,000 - 30,000        FEMALE AND NON-PREGNANT FEMALE:     LESS THAN 5 mIU/mL   . Acetaminophen (Tylenol), Serum 08/02/2019 <10* 10 - 30 ug/mL Final   Comment: (NOTE) Therapeutic concentrations vary significantly. A range of 10-30 ug/mL  may be an effective concentration for many patients. However, some  are best treated at concentrations outside of this range. Acetaminophen concentrations >150 ug/mL at 4 hours after ingestion  and >50 ug/mL at 12 hours after ingestion are often associated with  toxic reactions.  Performed at [facility], [place] [address] [place] [address] [place], [place] [address]   . Magnesium 08/02/2019 1.9  1.7 - 2.4 mg/dL Final   Performed at [place] [address] [place], [place] [address]  . SARS Coronavirus 2 08/02/2019 NEGATIVE  NEGATIVE Final   Comment: (NOTE) SARS-CoV-2 target nucleic acids are NOT DETECTED.  The SARS-CoV-2 RNA is generally detectable in upper and lower respiratory specimens during the acute phase of infection. The lowest concentration of SARS-CoV-2 viral copies this assay can detect is 250 copies / mL. A negative result does not preclude  SARS-CoV-2 infection and should not be used as the sole basis for treatment or other patient management decisions.  A negative result may occur with improper specimen collection / handling, submission of specimen other than nasopharyngeal swab, presence of viral mutation(s) within the areas targeted by this assay, and inadequate number of viral copies (<250 copies / mL). A negative result must be combined with clinical observations, patient history, and epidemiological information.  Fact Sheet for Patients:   StrictlyIdeas.no  Fact Sheet for Healthcare Providers: BankingDealers.co.za  This test is not yet approved or                           cleared by the [place] FDA and has been authorized for detection and/or diagnosis of SARS-CoV-2 by FDA under an Emergency Use Authorization (EUA).  This EUA will remain in effect (meaning this test can be used) for the duration of the COVID-19 declaration under Section 564(b)(1) of the Act, 21 U.S.C. section 360bbb-3(b)(1), unless the authorization is terminated or revoked sooner.  Performed at [facility], [place] [address] [place], [place] [address]   Admission on 07/31/2019, Discharged on 07/31/2019  Component Date Value Ref Range Status  . Chlamydia 07/31/2019 Negative   Final  . Neisseria Gonorrhea 07/31/2019 Negative   Final  . Comment 07/31/2019 Normal Reference Ranger Chlamydia - Negative   Final  . Comment 07/31/2019 Normal Reference Range Neisseria Gonorrhea - Negative   Final  . Color, Urine 07/31/2019 YELLOW  YELLOW Final  . APPearance 07/31/2019 CLEAR  CLEAR Final  . Specific Gravity, Urine 07/31/2019 1.016  1.005 - 1.030 Final  . pH 07/31/2019 5.0  5.0 - 8.0 Final  . Glucose, UA 07/31/2019 NEGATIVE  NEGATIVE mg/dL Final  . Hgb urine dipstick 07/31/2019 NEGATIVE  NEGATIVE Final  . Bilirubin Urine 07/31/2019 NEGATIVE  NEGATIVE Final  . Ketones, ur 07/31/2019  NEGATIVE  NEGATIVE mg/dL Final  . Protein, ur 07/31/2019 NEGATIVE  NEGATIVE mg/dL Final  . Nitrite 07/31/2019 NEGATIVE  NEGATIVE Final  . Chalmers Guest 07/31/2019 NEGATIVE  NEGATIVE Final   Performed at [place] [address] [place], [place] [address]  . Yeast Wet Prep HPF POC 07/31/2019 NONE SEEN  NONE SEEN Final  . Trich, Wet Prep 07/31/2019 NONE SEEN  NONE SEEN Final  .  Clue Cells Wet Prep HPF POC 07/31/2019 NONE SEEN  NONE SEEN Final  . WBC, Wet Prep HPF POC 07/31/2019 FEW* NONE SEEN Final  . Sperm 07/31/2019 NONE SEEN   Final   Performed at [facility], [place] [address], [place], [place] [address]  There may be more visits with results that are not included.    Allergies: Bee venom, Coconut flavor, Geodon [ziprasidone hcl], Haloperidol and related, Lithium, Oxycodone, Quetiapine, Seroquel [quetiapine fumarate], Shellfish allergy, Phenergan [promethazine hcl], Prilosec [omeprazole], Sulfa antibiotics, Tegretol [carbamazepine], and Tape  PTA Medications: (Not in a hospital admission)   Medical Decision Making    Recommendations  Based on my evaluation the patient does not appear to have an emergency medical condition.  The patient will remain under observation overnight and reassess in the A.M. to determine if she meets the criteria for psychiatric inpatient admission or can be discharged.  Caroline Sauger, NP 01/23/20  11:18 PM

## 2020-01-23 NOTE — ED Notes (Signed)
Safe transport called for transport. 

## 2020-01-23 NOTE — BH Assessment (Addendum)
Comprehensive Clinical Assessment (CCA) Screening, Triage and Referral Note  01/23/2020 [name] [id number]   [name] is a 29 year old female presenting voluntarily to [facility] due to requesting a medication change and for SI with plan to overdose on pills or to cut self with knife. Patient resides alone and reported having pills and a knife in the home. Patient was just discharged from [facility], length of stay from 01/17/2020 to 01/22/2020, due to attempted overdose. When asked about medications, patient stated " I am on Prozac and high dose of Risperdal, its too high". Patient reported no suicide attempts since discharged from [facility] and did report self-harming behaviors of scratching her arms. Patient denied alcohol/drug usage since being discharged. Patient reported worsening depressive symptoms, increased crying spells, guilt and anxiety. When asked about hallucinations, patient stated "I hear voices a little bit, go cut arm or something". Patient denied access to guns.  Please see TTS BH Assessment 01/13/2020.  Disposition: Ysidro Evert, NP, recommends continual observation for safety and stabilization with psych reassessment in the AM. Per Ysidro Evert, NP, patient will be transferred to [facility] for continual observation. Jimmey Ralph, RN at [facility] informed of patients acceptance. Maylon Cos, Agricultural consultant at [employer] was informed of patients acceptance to [facility].   Chief Complaint:  Chief Complaint  Patient presents with  . Suicidal   Visit Diagnosis: Major depressive disorder  Patient Reported Information How did you hear about [name]? Self   Referral name: self   Referral phone number: No data recorded Whom do you see for routine medical problems? Hospital ER   Practice/Facility Name: [facility]   Practice/Facility Phone Number: No data recorded  Name of Contact: No data recorded  Contact Number: No data recorded  Contact Fax Number: No data recorded  Prescriber Name: No data  recorded  Prescriber Address (if known): No data recorded What Is the Reason for Your Visit/Call Today? Medication is not working and SI with plan to overdose on pills or cut self with knife.  How Long Has This Been Causing You Problems? <Week  Have You Recently Been in Any Inpatient Treatment (Hospital/Detox/Crisis Center/28-Day Program)? No   Name/Location of Program/Hospital:[facility]   How Long Were You There? 01/17/2020-01/22/2020  When Were You Discharged? 01/22/2020 Have You Ever Received Services From [employer] Before? Yes   Who Do You See at [facility]? [facility]  Have You Recently Had Any Thoughts About Hurting Yourself? Yes   Are You Planning to Commit Suicide/Harm Yourself At This time?  Yes Have you Recently Had Thoughts About Hurting Someone [name]? No   Explanation: No data recorded Have You Used Any Alcohol or Drugs in the Past 24 Hours? No   How Long Ago Did You Use Drugs or Alcohol?  No data recorded  What Did You Use and How Much? No data recorded What Do You Feel Would Help You the Most Today? Other (Comment)  Do You Currently Have a Therapist/Psychiatrist? Yes   Name of Therapist/Psychiatrist: [facility]   Have You Been Recently Discharged From Any Office Practice or Programs? No   Explanation of Discharge From Practice/Program:     CCA Screening Triage Referral Assessment Type of Contact: Telepsych  Is this Initial or Reassessment? Initial  Date Telepsych consult ordered in [facility]:  01/23/2020  Time Telepsych consult ordered in [facility]: 1858  Patient Reported Information Reviewed? Yes   Patient Left Without Being Seen? No data recorded  Reason for Not Completing Assessment: n/a Collateral Involvement:  none reported  Does Patient Have a Stage manager Guardian? No data recorded  Name and Contact of Legal Guardian:  Self  If Minor and Not Living with Parent(s), Who has Custody? No data recorded Is CPS involved or ever been involved? Never  Is  APS involved or ever been involved? Never  Patient Determined To Be At Risk for Harm To Self or Others Based on Review of Patient Reported Information or Presenting Complaint? Yes, for Self-Harm   Method: No data recorded  Availability of Means: No data recorded  Intent: No data recorded  Notification Required: No data recorded  Additional Information for Danger to Others Potential:  No data recorded  Additional Comments for Danger to Others Potential:  No data recorded  Are There Guns or Other Weapons in Your Home?  No data recorded   Types of Guns/Weapons: No data recorded   Are These Weapons Safely Secured?                              No data recorded   Who Could Verify You Are Able To Have These Secured:    No data recorded Do You Have any Outstanding Charges, Pending Court Dates, Parole/Probation? No data recorded Contacted To Inform of Risk of Harm To Self or Others: -- (NA)  [facility] of [facility]: [facility]  Does Patient Present under Involuntary Commitment? No   IVC Papers Initial File Date: No data recorded  [place] of Residence: [name]  Patient Currently Receiving the Following Services: [facility])   Determination of Need: Urgent (48 hours)   Options For Referral: Inpatient Hospitalization   Venora Maples, [facility]

## 2020-01-23 NOTE — ED Triage Notes (Signed)
Pt recently discharged from behavioral health and comes in tonight with SI. States her meds were changed when she was at [facility] and still doesn't feel safe. Alert and oriented.

## 2020-01-23 NOTE — ED Triage Notes (Signed)
Patient arrives as transfer from [facility]. Reports continued SI. Reports was feeling okay when discharged from [facility] but when she got home the suicidal thoughts came back worse than before. Patient alert & oriented but states she has lost track of days being in the hospital. Patient calm & cooperative, in no acute distress at this time.

## 2020-01-23 NOTE — ED Provider Notes (Signed)
[name]   CSN: [id number] Arrival date & time: 01/23/20  1737     History Chief Complaint  Patient presents with  . Suicidal    [name] is a 29 y.o. female with PMHx schizoaffective disorder, bipolar disorder, Borderline Personality Disorder, PTSD, who presents to the ED today with suicidal ideation. Pt was discharged from [facility] yesterday; reports that she feels like her medications still aren't right and she continues to feel suicidal and unsafe. States that all day she thought about either cutting her wrists or taking an unknown amount of medication in an attempt at self harm. Pt also reports hearing voices - states that they are telling her to harm herself. No HI or AVH. Pt is currently here voluntarily.   Hospital Course: (Per Md's admission notes): [name] is a 29 year old female who was admitted to [facility] from [facility] after an overdose on her home medications. She is well known to this facility. She has a psychiatric history significant for reported schizoaffective disorder, bipolar type, Borderline Personality Disorder, PTSD, and chronic migraines. She was seen in her room shortly after admission. She is in no apparent distress and was snoring when this provider entered the room. She is very difficult to understand due to her rapid, pressured speech. She stated she took "a bunch" of sertraline, buspar, risperidone, Imitrex, hydroxyzine, Zofran, protonix, and sucralfate. Of Ward, these are her home medications, which she stated she takes "on and off." She stated she stops taking her medication when she feels better. She is currently living with her boyfriend and stated they have been arguing "a little" off and on." She stated she took the pills, did not vomit, got scared and called EMS. She arrived at the emergency room approximately 1 hour after ingestion. She stated she was trying to end her life but does not feel that way  now. She is requesting her medications be restarted while she is here.   This is one of several psychiatric admission/discharge summaries in this [facility] for this 29 year old Caucasian female with hx of chronic mental & multiple psychiatric admissions. She is known in this [facility] & other psychiatric hospitals within the surrounding areas for worsening symptoms of her mental illness, suicidal ideations  & multiple suicide attempts. She has been tried on multiple psychotropic medications for her symptoms & it appears nothing has actually been helpful in stabilizing her symptoms because [name] does not always adhere to her treatment recommendations. She was brought to the [facility] this time around for evaluation & treatment after an overdose attempt on her home medications.  After evaluation of her presenting symptoms, [name] was recommended for mood stabilization treatments. The medication regimen for her presenting symptoms were discussed & with her consent initiated. She received, stabilized & was discharged on the medications as listed below on her discharge medication lists. She was also enrolled & participated in the group counseling sessions being offered & held on this unit. She learned coping skills. She presented on this admission, other chronic medical conditions that required treatment & monitoring. She was resumed/discharged on all her pertinent home medications for those health issues. She tolerated her treatment regimen without any adverse effects or reactions reported.  On her admission day during her present hospital stay, [name] was noted to be talkative, tangential as well as circumstantial. However, she denies any symptoms of psychosis. She presented very disorganized in her speech & thinking pattern & yet very difficult to redirect.  On this day of her hospital discharge, [name] to some extent remained tangential & disorganized, but aware of situation & able to make concrete decisions. She did say that she  is feeling a lot better more so knowing that she is back on her medications she knows will help her the best. She maintained that she is neither suicidal, homicidal nor having any hallucinations, delusions or paranoia. She denies any symptoms of depression or anxiety. She presented mentally & medically stable for discharge.  During the course of this hospitalization, the 15-minute checks were adequate to ensure Arneshia's safety.  Patient did not display any dangerous, violent or suicidal behavior on the unit.  She interacted with staff appropriately. She participated in the group sessions/therapies. Her medications were addressed & adjusted to meet her needs. She was recommended for outpatient follow-up care & medication management upon discharge to assure her continuity of care.  At the time of discharge patient is not reporting any acute suicidal/homicidal ideations, hallucinations, delusions or paranoia. She feels more confident about her self-care & in managing her mental illness. She currently denies any new issues or concerns. Education and supportive counseling provided throughout her hospital stay & upon discharge.  Today upon her discharge evaluation with the attending psychiatrist, [name] shares she is doing well. She denies any other specific concerns. She is sleeping well. Her appetite is good. She denies other physical complaints. She denies AH/VH. She feels that her medications have been helpful & is in agreement to continue her current treatment regimen as recommended.She was able to engage in safety planning including plan to return to [facility] or contact emergency services if she feels unable to maintain her own safety or the safety of others. Pt had no further questions, comments, or concerns.She left [facility] with all personal belongings in no apparent distress. Transportation per the [employer].  The history is provided by the patient and medical records.       Past Medical  History:  Diagnosis Date  . Acid reflux   . Anxiety   . Asthma    last attack 03/13/15 or 03/14/15  . Autism   . 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 Chronic constipation   . Depression   . Drug-seeking behavior   . Essential tremor   . Headache   . Overdose of acetaminophen 07/2017   and other meds  . Personality disorder ([place])   . Schizo-affective psychosis ([place])   . Schizoaffective disorder, bipolar type ([place])   . Seizures [facility])    Last seizure December 2017  . Sleep apnea     Patient Active Problem List   Diagnosis Date Noted  . MDD (major depressive disorder), recurrent episode, severe ([place]) 01/17/2020  . Adjustment disorder with mixed disturbance of emotions and conduct 08/03/2019  . Overdose 07/22/2017  . Intentional acetaminophen overdose ([place])   . DUB (dysfunctional uterine bleeding) 11/22/2016  . Hyperprolactinemia ([place]) 08/20/2016  . Tachycardia 01/13/2016  . 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  . Seizure disorder ([place]) 08/08/2015  . Chronic migraine 07/27/2015  . Asthma 04/15/2015  . Schizoaffective disorder, bipolar type ([place]) 03/10/2014  . PTSD (post-traumatic stress disorder) 03/10/2014  . Suicidal ideations   . Borderline personality disorder ([place]) 10/31/2013  . Autism spectrum disorder 06/15/2013    Past Surgical History:  Procedure Laterality Date  . MOUTH SURGERY  2009 or 2010     OB History    Gravida  2   Para  1   Term  1   Preterm      AB  1   Living  1     SAB  1   IAB      Ectopic      Multiple  0   Live Births  1           Family History  Problem Relation Age of Onset  . Mental illness Father   . Asthma Father   . PDD Brother   . Seizures Brother     Social History   Tobacco Use  . Smoking status: Former Smoker    Packs/day: 0.00  . Smokeless tobacco: Never Used  . Tobacco comment: Smoked for 2  years age [age]  Vaping Use  . Vaping Use: Never used  Substance Use Topics  . Alcohol use: No     Alcohol/week: 1.0 standard drink    Types: 1 Standard drinks or equivalent per week    Comment: denies at this time  . Drug use: No    Comment: History of cocaine use at age 9 for 4 months    Home Medications Prior to Admission medications   Medication Sig Start Date End Date Taking? Authorizing Provider  SUMAtriptan (IMITREX) 50 MG tablet Take 50 mg by mouth 2 (two) times daily as needed for migraine. 01/12/20  Yes [provider]  albuterol (PROVENTIL HFA;VENTOLIN HFA) 108 (90 Base) MCG/ACT inhaler Inhale 1-2 puffs into the lungs every 6 (six) hours as needed for wheezing or shortness of breath.    [provider]  benzocaine (ORAJEL) 10 % mucosal gel Use as directed in the mouth or throat 4 (four) times daily as needed for mouth pain. 01/22/20   Lindell Spar I, NP  FLUoxetine (PROZAC) 20 MG capsule Take 1 capsule (20 mg total) by mouth daily. For depression 01/23/20   Lindell Spar I, NP  fluticasone-salmeterol (ADVAIR HFA) 791-50 MCG/ACT inhaler Inhale 2 puffs into the lungs 2 (two) times daily. Patient not taking: No sig reported 08/03/15   Timmothy Euler, MD  hydrOXYzine (ATARAX/VISTARIL) 25 MG tablet Take 1 tablet (25 mg total) by mouth 3 (three) times daily as needed for anxiety. 01/22/20   Lindell Spar I, NP  mometasone-formoterol (DULERA) 200-5 MCG/ACT AERO Inhale 2 puffs into the lungs 2 (two) times daily. For shortness of breath 01/22/20   Lindell Spar I, NP  ondansetron (ZOFRAN) 4 MG tablet Take 1 tablet (4 mg total) by mouth every 8 (eight) hours as needed for nausea or vomiting. 01/08/20   Loni Beckwith, PA-C  pantoprazole (PROTONIX) 40 MG tablet Take 1 tablet (40 mg total) by mouth daily. 06/16/19   Lajean Saver, MD  risperiDONE (RISPERDAL) 3 MG tablet Take 1 tablet (3 mg total) by mouth 2 (two) times daily. For mood control 01/22/20   Lindell Spar I, NP  traZODone (DESYREL) 100 MG tablet Take 2 tablets (200 mg total) by mouth at bedtime. For sleep  01/22/20   Lindell Spar I, NP    Allergies    Bee venom, Coconut flavor, Geodon [ziprasidone hcl], Haloperidol and related, Lithium, Oxycodone, Quetiapine, Seroquel [quetiapine fumarate], Shellfish allergy, Phenergan [promethazine hcl], Prilosec [omeprazole], Sulfa antibiotics, Tegretol [carbamazepine], and Tape  Review of Systems   Review of Systems  Constitutional: Negative for chills and fever.  Psychiatric/Behavioral: Positive for hallucinations and suicidal ideas. Negative for self-injury. The patient is not nervous/anxious.   All other systems reviewed and are negative.   Physical Exam Updated Vital Signs BP 122/74 (BP  Location: Left Arm)   Pulse (!) 105   Temp 98.2 F (36.8 C) (Oral)   Resp 16   SpO2 97%   Physical Exam Vitals and nursing Ward reviewed.  Constitutional:      Appearance: She is not ill-appearing.  HENT:     Head: Normocephalic and atraumatic.  Eyes:     Conjunctiva/sclera: Conjunctivae normal.  Cardiovascular:     Rate and Rhythm: Normal rate and regular rhythm.  Pulmonary:     Effort: Pulmonary effort is normal.     Breath sounds: Normal breath sounds. No wheezing, rhonchi or rales.  Abdominal:     Palpations: Abdomen is soft.     Tenderness: There is no abdominal tenderness. There is no guarding or rebound.  Musculoskeletal:     Cervical back: Neck supple.  Skin:    General: Skin is warm and dry.  Neurological:     Mental Status: She is alert.  Psychiatric:        Mood and Affect: Affect is flat.        Behavior: Behavior is withdrawn.        Thought Content: Thought content includes suicidal ideation. Thought content includes suicidal plan.     ED Results / Procedures / Treatments   Labs (all labs ordered are listed, but only abnormal results are displayed) Labs Reviewed  COMPREHENSIVE METABOLIC PANEL - Abnormal; Notable for the following components:      Result Value   Glucose, Bld 100 (*)    All other components within normal  limits  SALICYLATE LEVEL - Abnormal; Notable for the following components:   Salicylate Lvl <5.8 (*)    All other components within normal limits  ACETAMINOPHEN LEVEL - Abnormal; Notable for the following components:   Acetaminophen (Tylenol), Serum <10 (*)    All other components within normal limits  ETHANOL  CBC  RAPID URINE DRUG SCREEN, HOSP PERFORMED  I-STAT BETA HCG BLOOD, ED ([facility], [facility], [facility] ONLY)    EKG None  Radiology No results found.  Procedures Procedures (including critical care time)  Medications Ordered in ED Medications  SUMAtriptan (IMITREX) tablet 50 mg (has no administration in time range)    ED Course  I have reviewed the triage vital signs and the nursing notes.  Pertinent labs & imaging results that were available during my care of the patient were reviewed by me and considered in my medical decision making (see chart for details).  Clinical Course as of 01/23/20 2113  Sat Jan 23, 2020  2059 Pt is medically cleared [MV]    Clinical Course User Index [MV] Eustaquio Maize, PA-C   MDM Rules/Calculators/A&P                          29 year old female who presents to the ED today complaining of suicidal ideation.  Was just discharged from behavioral [facility] yesterday after a overdose attempt.  Reports that she had her medications changed however she still feels like it is not appropriate still feels like she wants to hurt herself.  She has a plan of self-harm with cutting or ingestion of unknown amount of medications.  On arrival to the ED vitals are stable.  Patient is afebrile, tachycardic per vitals however on my exam no longer tachycardic, nontachypneic.  Appears to be in no acute distress.  She does have flat affect at this time.  We will plan for medical screening and consulted TTS.  Patient is here  voluntarily.   Labwork unremarkable at this time. She is medically cleared and pending TTS eval. Nursing staff reports that pt is complaining of a  migraine headache - she has hx of same and takes Imitrex; will order for her.   Oncoming team to dispo patient accordingly after TTS eval.   This Ward was prepared using Dragon voice recognition software and may include unintentional dictation errors due to the inherent limitations of voice recognition software.  Final Clinical Impression(s) / ED Diagnoses Final diagnoses:  None    Rx / DC Orders ED Discharge Orders    None       Eustaquio Maize, Hershal Coria 01/23/20 2114    Lacretia Leigh, MD 01/24/20 1309

## 2020-01-23 NOTE — ED Notes (Signed)
PATIENT BELONGINGS ARE PLACED IN LOCKER 30

## 2020-01-23 NOTE — BH Assessment (Signed)
TTS Disposition: Ysidro Evert, NP, recommends continual observation for safety and stabilization with psych reassessment in the AM. Per Ysidro Evert, NP, patient will be transferred to [facility] for continual observation. Jimmey Ralph, RN at [facility] informed of patients acceptance. Maylon Cos, Agricultural consultant at [employer] was informed of patients acceptance to [facility].

## 2020-01-23 NOTE — ED Notes (Signed)
Patient given sandwich.  

## 2020-01-24 DIAGNOSIS — F332 Major depressive disorder, recurrent severe without psychotic features: Secondary | ICD-10-CM | POA: Diagnosis not present

## 2020-01-24 DIAGNOSIS — F603 Borderline personality disorder: Secondary | ICD-10-CM | POA: Diagnosis not present

## 2020-01-24 NOTE — ED Notes (Signed)
Pt given breakfast; muffin, oatmeal, juice

## 2020-01-24 NOTE — ED Notes (Signed)
Pt alert and oriented on the unit. Education, support, and encouragement provided. Discharge summary, medications and follow up appointments reviewed with pt. Suicide prevention resources provided. Pt's belongings in locker returned. Pt denies SI/HI, A/VH, pain, or any concerns at this time. Pt ambulatory on and off unit. Pt discharged to lobby. 

## 2020-01-24 NOTE — ED Provider Notes (Signed)
FBC/OBS ASAP Discharge Summary  Date and Time: 01/24/2020 1:54 PM  Name: [name]  MRN:  [id number]   Discharge Diagnoses:  Final diagnoses:  Severe episode of recurrent major depressive disorder, without psychotic features ([place])    Subjective: Patient states "I have been dealing with a lot lately."  Patient reports she was discharged from [facility] behavioral health 2 days ago.  Patient endorses passive suicidal ideations times "a long time."  Patient denies any plan or intent to harm self.  Patient reports upon discharge from [facility] behavioral health 2 days ago she believes that she was "discharged too soon."  Patient reports she resides alone in [place].  Patient denies access to weapons.  Patient reports she has a 85-year-old daughter who resides with her paternal grandmother.  Patient reports she is currently not employed but actively seeking employment in the childcare or healthcare industry.  Patient denies alcohol and substance use.  Patient assessed by nurse practitioner.  Patient alert and oriented, answers appropriately.  Patient pleasant cooperative during assessment.  Patient denies homicidal ideations.  Patient denies auditory and visual hallucinations.  There is no evidence of delusional thought content and no indication that patient is responding to internal stimuli.  Patient denies symptoms of paranoia.  Patient is followed by outpatient psychiatry through [facility] act team.  Patient reports compliance with medications.  Patient offered support and encouragement.  Patient denies any person to contact for collateral information at this time.  NP. T.Eliceo Gladu assisted with patient's discharge, as she was escorted off the unit. NP requested a taxi voucher and or safe transport to take patient back home.  Nurse tech, security and NP waited in the lobby with patient until she was transported back to her house. Support, encouragement and reassurances was provided.   Stay  Summary:  [name] is a 29 year old female presenting voluntarily to [facility] due to requesting a medication change and for SI with a plan to overdose on pills or to cut herself with a knife. The patient resided alone and reported having pills and a knife in the home. Due to attempted overdose, the patient was recently discharged from [facility], length of stay from 01/17/2020 to 01/22/2020. When asked about medications, the patient stated, " I am on Prozac and high dose of Risperdal, it too high." The patient reported no suicide attempts since being discharged from [facility] and reported self-harming behaviors of scratching her arms. The patient disclosed she has a 40 y/o daughter who lives with her paternal grandmother. The patient revealed that her husband attempted suicide in 2018, which was a success. She currently lives alone and does not have much support. She disclosed she has a court order for her daughter, but her mother-in-law does not follow the orders. Therefore, she does not see her daughter. The patient is disheveled, alert, and oriented x4. The patient speaks clearly, at moderate volume, and normal pace. Motor behavior appears normal. Eye contact is good. The patient's mood is depressed, and her affect is congruent with mood. The thought process is coherent and relevant. There is no indication the patient is currently responding to internal stimuli or experiencing delusional thought content. The patient was cooperative throughout the assessment. He is requesting inpatient treatment for mental health and substance use.   Total Time spent with patient: 30 minutes  Past Psychiatric History: Major depressive disorder, recurrent severe, PTSD, adjustment disorder with mixed disturbance of emotions and conduct, suicidal ideations, schizoaffective disorder, bipolar type, borderline personality disorder  Past Medical History:  Past Medical History:  Diagnosis Date   Acid reflux    Anxiety     Asthma    last attack 03/13/15 or 03/14/15   Autism    Carrier of fragile X syndrome    Chronic constipation    Depression    Drug-seeking behavior    Essential tremor    Headache    Overdose of acetaminophen 07/2017   and other meds   Personality disorder ([place])    Schizo-affective psychosis ([place])    Schizoaffective disorder, bipolar type ([place])    Seizures ([place])    Last seizure December 2017   Sleep apnea     Past Surgical History:  Procedure Laterality Date   MOUTH SURGERY  2009 or 2010   Family History:  Family History  Problem Relation Age of Onset   Mental illness Father    Asthma Father    PDD Brother    Seizures Brother    Family Psychiatric History: None reported Social History:  Social History   Substance and Sexual Activity  Alcohol Use No   Alcohol/week: 1.0 standard drink   Types: 1 Standard drinks or equivalent per week   Comment: denies at this time     Social History   Substance and Sexual Activity  Drug Use No   Comment: History of cocaine use at age 29 for 4 months    Social History   Socioeconomic History   Marital status: Widowed    Spouse name: Not on file   Number of children: 0   Years of education: Not on file   Highest education level: Not on file  Occupational History   Occupation: disability  Tobacco Use   Smoking status: Former Smoker    Packs/day: 0.00   Smokeless tobacco: Never Used   Tobacco comment: Smoked for 2  years age [age]  Vaping Use   Vaping Use: Never used  Substance and Sexual Activity   Alcohol use: No    Alcohol/week: 1.0 standard drink    Types: 1 Standard drinks or equivalent per week    Comment: denies at this time   Drug use: No    Comment: History of cocaine use at age 43 for 4 months   Sexual activity: Yes    Birth control/protection: None  Other Topics Concern   Not on file  Social History Narrative   Marital status: Widowed      Children: daughter      Lives:  with boyfriend, in two story home      Employment:  Disability      Tobacco: quit smoking; smoked for two years.      Alcohol ;none      Drugs: none   Has not traveled outside of the country.   Right handed         Social Determinants of Health   Financial Resource Strain: Not on file  Food Insecurity: Not on file  Transportation Needs: Not on file  Physical Activity: Not on file  Stress: Not on file  Social Connections: Not on file   SDOH:  SDOH Screenings   Alcohol Screen: Low Risk    Last Alcohol Screening Score (AUDIT): 0  Depression (PHQ2-9): Medium Risk   PHQ-2 Score: 15  Financial Resource Strain: Not on file  Food Insecurity: Not on file  Housing: Not on file  Physical Activity: Not on file  Social Connections: Not on file  Stress: Not on file  Tobacco Use: Medium  Risk   Smoking Tobacco Use: Former Smoker   Smokeless Tobacco Use: Never Used  Transportation Needs: Not on file    Has this patient used any form of tobacco in the last 30 days? (Cigarettes, Smokeless Tobacco, Cigars, and/or Pipes) A prescription for an FDA-approved tobacco cessation medication was offered at discharge and the patient refused  Current Medications:  Current Facility-Administered Medications  Medication Dose Route Frequency Provider Last Rate Last Admin   acetaminophen (TYLENOL) tablet 650 mg  650 mg Oral Q6H PRN Caroline Sauger, NP       alum & mag hydroxide-simeth (MAALOX/MYLANTA) 200-200-20 MG/5ML suspension 30 mL  30 mL Oral Q4H PRN Caroline Sauger, NP       FLUoxetine (PROZAC) capsule 20 mg  20 mg Oral Daily Caroline Sauger, NP       hydrOXYzine (ATARAX/VISTARIL) tablet 25 mg  25 mg Oral TID PRN Caroline Sauger, NP   25 mg at 01/23/20 2336   magnesium hydroxide (MILK OF MAGNESIA) suspension 30 mL  30 mL Oral Daily PRN Caroline Sauger, NP       mometasone-formoterol (DULERA) 200-5 MCG/ACT inhaler 2 puff  2 puff Inhalation BID Caroline Sauger,  NP   2 puff at 01/23/20 2335   risperiDONE (RISPERDAL) tablet 3 mg  3 mg Oral BID Caroline Sauger, NP   3 mg at 01/23/20 2335   SUMAtriptan (IMITREX) tablet 50 mg  50 mg Oral BID PRN Caroline Sauger, NP       traZODone (DESYREL) tablet 200 mg  200 mg Oral QHS Caroline Sauger, NP   200 mg at 01/23/20 2335   Current Outpatient Medications  Medication Sig Dispense Refill   albuterol (PROVENTIL HFA;VENTOLIN HFA) 108 (90 Base) MCG/ACT inhaler Inhale 1-2 puffs into the lungs every 6 (six) hours as needed for wheezing or shortness of breath.     benzocaine (ORAJEL) 10 % mucosal gel Use as directed in the mouth or throat 4 (four) times daily as needed for mouth pain. 5.3 g 0   FLUoxetine (PROZAC) 20 MG capsule Take 1 capsule (20 mg total) by mouth daily. For depression 30 capsule 0   fluticasone-salmeterol (ADVAIR HFA) 115-21 MCG/ACT inhaler Inhale 2 puffs into the lungs 2 (two) times daily. 1 Inhaler 5   hydrOXYzine (ATARAX/VISTARIL) 25 MG tablet Take 1 tablet (25 mg total) by mouth 3 (three) times daily as needed for anxiety. 75 tablet 0   mometasone-formoterol (DULERA) 200-5 MCG/ACT AERO Inhale 2 puffs into the lungs 2 (two) times daily. For shortness of breath 1 each 0   ondansetron (ZOFRAN) 4 MG tablet Take 1 tablet (4 mg total) by mouth every 8 (eight) hours as needed for nausea or vomiting. 12 tablet 0   pantoprazole (PROTONIX) 40 MG tablet Take 1 tablet (40 mg total) by mouth daily. 30 tablet 0   risperiDONE (RISPERDAL) 3 MG tablet Take 1 tablet (3 mg total) by mouth 2 (two) times daily. For mood control 60 tablet 0   SUMAtriptan (IMITREX) 50 MG tablet Take 50 mg by mouth 2 (two) times daily as needed for migraine.     traZODone (DESYREL) 100 MG tablet Take 2 tablets (200 mg total) by mouth at bedtime. For sleep 30 tablet 0    PTA Medications: (Not in a hospital admission)   Musculoskeletal  Strength & Muscle Tone: within normal limits Gait & Station:  normal Patient leans: N/A  Psychiatric Specialty Exam  Presentation  General Appearance: Appropriate for Environment; Casual  Eye Contact:Good  Speech:Clear and  Coherent; Normal Rate  Speech Volume:Normal  Handedness:Right   Mood and Affect  Mood:Anxious  Affect:Appropriate; Congruent   Thought Process  Thought Processes:Coherent; Goal Directed  Descriptions of Associations:Intact  Orientation:Full (Time, Place and Person)  Thought Content:Logical  Hallucinations:Hallucinations: None  Ideas of Reference:None  Suicidal Thoughts:Suicidal Thoughts: Yes, Passive SI Active Intent and/or Plan: Without Intent; With Plan; With Means to Carry Out; With Access to Means SI Passive Intent and/or Plan: With Intent; With Plan  Homicidal Thoughts:Homicidal Thoughts: No   Sensorium  Memory:Immediate Good; Recent Good; Remote Good  Judgment:Fair  Insight:Fair   Executive Functions  Concentration:Good  Attention Span:Good  Recall:Good  Fund of Knowledge:Good  Language:Good   Psychomotor Activity  Psychomotor Activity:Psychomotor Activity: Normal   Assets  Assets:Communication Skills; Desire for Improvement; Financial Resources/Insurance; Housing; Intimacy; Leisure Time; Physical Health; Resilience; Social Support; Talents/Skills   Sleep  Sleep:Sleep: Fair   Physical Exam  Physical Exam Neurological:     Mental Status: She is alert.  Psychiatric:        Thought Content: Thought content normal.    Review of Systems  Psychiatric/Behavioral: Positive for suicidal ideas. The patient is nervous/anxious.   All other systems reviewed and are negative.  Blood pressure 116/73, pulse 92, temperature 97.8 F (36.6 C), temperature source Oral, resp. rate 16, height 5\' 4"  (1.626 m), weight 78 kg, SpO2 99 %. Body mass index is 29.52 kg/m.  Demographic Factors:  Caucasian  Loss Factors: NA  Historical Factors: NA  Risk Reduction Factors:   Sense of  responsibility to family, Positive social support, Positive therapeutic relationship and Positive coping skills or problem solving skills  Continued Clinical Symptoms:  Personality Disorders:   Cluster B  Cognitive Features That Contribute To Risk:  None    Suicide Risk:  Minimal: No identifiable suicidal ideation.  Patients presenting with no risk factors but with morbid ruminations; may be classified as minimal risk based on the severity of the depressive symptoms  Plan Of Care/Follow-up recommendations:  Other:  Follow-up with established outpatient psychiatry, [facility] act team. Patient reviewed with Dr. Hampton Abbot.  Disposition: Discharge  Emmaline Kluver, FNP 01/24/2020, 1:54 PM

## 2020-01-24 NOTE — Discharge Instructions (Signed)
Take all medications as prescribed. Keep all follow-up appointments as scheduled.  Do not consume alcohol or use illegal drugs while on prescription medications. Report any adverse effects from your medications to your primary care provider promptly.  In the event of recurrent symptoms or worsening symptoms, call 911, a crisis hotline, or go to the nearest emergency department for evaluation.   

## 2020-01-25 ENCOUNTER — Ambulatory Visit (INDEPENDENT_AMBULATORY_CARE_PROVIDER_SITE_OTHER)
Admission: EM | Admit: 2020-01-25 | Discharge: 2020-01-25 | Disposition: A | Discharge status: Home / Self Care | Payer: PPO | Source: Home / Self Care

## 2020-01-25 ENCOUNTER — Emergency Department (HOSPITAL_COMMUNITY)
Admission: EM | Admit: 2020-01-25 | Discharge: 2020-01-26 | Disposition: A | Discharge status: Home / Self Care | Payer: PPO | Attending: Emergency Medicine | Admitting: Emergency Medicine

## 2020-01-25 ENCOUNTER — Other Ambulatory Visit: Payer: Self-pay

## 2020-01-25 ENCOUNTER — Encounter (HOSPITAL_COMMUNITY): Payer: Self-pay | Admitting: Emergency Medicine

## 2020-01-25 DIAGNOSIS — R45851 Suicidal ideations: Secondary | ICD-10-CM

## 2020-01-25 DIAGNOSIS — Z20822 Contact with and (suspected) exposure to covid-19: Secondary | ICD-10-CM | POA: Diagnosis not present

## 2020-01-25 DIAGNOSIS — J45909 Unspecified asthma, uncomplicated: Secondary | ICD-10-CM | POA: Diagnosis not present

## 2020-01-25 DIAGNOSIS — F332 Major depressive disorder, recurrent severe without psychotic features: Secondary | ICD-10-CM | POA: Insufficient documentation

## 2020-01-25 DIAGNOSIS — F322 Major depressive disorder, single episode, severe without psychotic features: Secondary | ICD-10-CM | POA: Insufficient documentation

## 2020-01-25 DIAGNOSIS — Z87891 Personal history of nicotine dependence: Secondary | ICD-10-CM | POA: Insufficient documentation

## 2020-01-25 LAB — CBC
HCT: 42.2 % (ref 36.0–46.0)
Hemoglobin: 13.1 g/dL (ref 12.0–15.0)
MCH: 25.8 pg — ABNORMAL LOW (ref 26.0–34.0)
MCHC: 31 g/dL (ref 30.0–36.0)
MCV: 83.2 fL (ref 80.0–100.0)
Platelets: 258 10*3/uL (ref 150–400)
RBC: 5.07 MIL/uL (ref 3.87–5.11)
RDW: 13.5 % (ref 11.5–15.5)
WBC: 8.2 10*3/uL (ref 4.0–10.5)
nRBC: 0 % (ref 0.0–0.2)

## 2020-01-25 LAB — RAPID URINE DRUG SCREEN, HOSP PERFORMED
Amphetamines: NOT DETECTED
Barbiturates: NOT DETECTED
Benzodiazepines: NOT DETECTED
Cocaine: NOT DETECTED
Opiates: NOT DETECTED
Tetrahydrocannabinol: NOT DETECTED

## 2020-01-25 LAB — COMPREHENSIVE METABOLIC PANEL
ALT: 19 U/L (ref 0–44)
AST: 19 U/L (ref 15–41)
Albumin: 4.2 g/dL (ref 3.5–5.0)
Alkaline Phosphatase: 73 U/L (ref 38–126)
Anion gap: 10 (ref 5–15)
BUN: 8 mg/dL (ref 6–20)
CO2: 24 mmol/L (ref 22–32)
Calcium: 9.4 mg/dL (ref 8.9–10.3)
Chloride: 102 mmol/L (ref 98–111)
Creatinine, Ser: 0.74 mg/dL (ref 0.44–1.00)
GFR, Estimated: >60 mL/min (ref >60)
Glucose, Bld: 94 mg/dL (ref 70–99)
Potassium: 3.9 mmol/L (ref 3.5–5.1)
Sodium: 136 mmol/L (ref 135–145)
Total Bilirubin: 0.7 mg/dL (ref 0.3–1.2)
Total Protein: 7.4 g/dL (ref 6.5–8.1)

## 2020-01-25 LAB — SALICYLATE LEVEL: Salicylate Lvl: <7 mg/dL — ABNORMAL LOW (ref 7.0–30.0)

## 2020-01-25 LAB — I-STAT BETA HCG BLOOD, ED (MC, WL, AP ONLY): I-stat hCG, quantitative: <5 m[IU]/mL (ref <5)

## 2020-01-25 LAB — ETHANOL: Alcohol, Ethyl (B): <10 mg/dL (ref <10)

## 2020-01-25 LAB — ACETAMINOPHEN LEVEL: Acetaminophen (Tylenol), Serum: <10 ug/mL — ABNORMAL LOW (ref 10–30)
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
## 2020-01-25 NOTE — ED Notes (Signed)
ACT team called back and advised could not pick pt up at 10am in morning d/t policy. Did advise pt was open to go to [facility]. Advised wasn't sure if they had open beds. ACT team stated that pt could be transported home in morning by other means; cab, safe transport, bus, [employer]; just not by ACT team.

## 2020-01-25 NOTE — ED Notes (Signed)
Pt refusing to leave. Doesn't want to come back to continous assessment but also doesn't want to leave. Sitting in lobby. ACT team will pick up at 10am
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
## 2020-01-25 NOTE — ED Triage Notes (Signed)
Patient reports suicidal ideation , plans to cut her wrist with auditory hallucinations .
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
## 2020-01-25 NOTE — Discharge Instructions (Addendum)
Take all medications as prescribed. Keep all follow-up appointments as scheduled.  Do not consume alcohol or use illegal drugs while on prescription medications. Report any adverse effects from your medications to your primary care provider promptly.  In the event of recurrent symptoms or worsening symptoms, call 911, a crisis hotline, or go to the nearest emergency department for evaluation.   

## 2020-01-26 LAB — RESP PANEL BY RT-PCR (FLU A&B, COVID) ARPGX2
Influenza A by PCR: NEGATIVE
Influenza B by PCR: NEGATIVE
SARS Coronavirus 2 by RT PCR: NEGATIVE

## 2020-01-26 MED ORDER — SUMATRIPTAN SUCCINATE 50 MG PO TABS
50.0000 mg | ORAL_TABLET | Freq: Once | ORAL | Status: DC
  Filled 2020-01-26: qty 1

## 2020-01-26 NOTE — ED Provider Notes (Addendum)
[facility] EMERGENCY DEPARTMENT Provider Note   CSN: [id number] Arrival date & time: 01/25/20  1951     History Chief Complaint  Patient presents with  . Suicidal    [name] is a 29 y.o. female.   29 year old female presents to the emergency department for evaluation of suicidal ideations.  Reports ongoing SI over the past 5 days.  Expresses thoughts of wanting to slit her wrists.  She has been evaluated at [facility] multiple times recently; just discharged at 1800 yesterday after evaluation.  States that the medications she is taking makes her extremely depressed, making it difficult for her to get out of bed or eat anything.  She is no auditory or visual hallucinations, homicidal ideations.  The history is provided by the patient. No language interpreter was used.       Past Medical History:  Diagnosis Date  . Acid reflux   . Anxiety   . Asthma    last attack 03/13/15 or 03/14/15  . Autism   . 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 Chronic constipation   . Depression   . Drug-seeking behavior   . Essential tremor   . Headache   . Overdose of acetaminophen 07/2017   and other meds  . Personality disorder ([place])   . Schizo-affective psychosis ([place])   . Schizoaffective disorder, bipolar type ([place])   . Seizures [facility])    Last seizure December 2017  . Sleep apnea     Patient Active Problem List   Diagnosis Date Noted  . MDD (major depressive disorder), recurrent episode, severe ([place]) 01/17/2020  . Adjustment disorder with mixed disturbance of emotions and conduct 08/03/2019  . Overdose 07/22/2017  . Intentional acetaminophen overdose ([place])   . DUB (dysfunctional uterine bleeding) 11/22/2016  . Hyperprolactinemia ([place]) 08/20/2016  . Tachycardia 01/13/2016  . 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  . Seizure disorder ([place]) 08/08/2015  . Chronic migraine 07/27/2015  . Asthma 04/15/2015  . Schizoaffective disorder, bipolar type ([place])  03/10/2014  . PTSD (post-traumatic stress disorder) 03/10/2014  . Suicidal ideations   . Borderline personality disorder ([place]) 10/31/2013  . Autism spectrum disorder 06/15/2013    Past Surgical History:  Procedure Laterality Date  . MOUTH SURGERY  2009 or 2010     OB History    Gravida  2   Para  1   Term  1   Preterm      AB  1   Living  1     SAB  1   IAB      Ectopic      Multiple  0   Live Births  1           Family History  Problem Relation Age of Onset  . Mental illness Father   . Asthma Father   . PDD Brother   . Seizures Brother     Social History   Tobacco Use  . Smoking status: Former Smoker    Packs/day: 0.00  . Smokeless tobacco: Never Used  . Tobacco comment: Smoked for 2  years age [age]  Vaping Use  . Vaping Use: Never used  Substance Use Topics  . Alcohol use: No    Alcohol/week: 1.0 standard drink    Types: 1 Standard drinks or equivalent per week    Comment: denies at this time  . Drug use: No    Comment: History of cocaine use at age 34 for 4 months  Home Medications Prior to Admission medications   Medication Sig Start Date End Date Taking? Authorizing Provider  albuterol (PROVENTIL HFA;VENTOLIN HFA) 108 (90 Base) MCG/ACT inhaler Inhale 1-2 puffs into the lungs every 6 (six) hours as needed for wheezing or shortness of breath.    [provider]  benzocaine (ORAJEL) 10 % mucosal gel Use as directed in the mouth or throat 4 (four) times daily as needed for mouth pain. 01/22/20   Lindell Spar I, NP  FLUoxetine (PROZAC) 20 MG capsule Take 1 capsule (20 mg total) by mouth daily. For depression 01/23/20   Lindell Spar I, NP  fluticasone-salmeterol (ADVAIR HFA) 546-27 MCG/ACT inhaler Inhale 2 puffs into the lungs 2 (two) times daily. 08/03/15   Timmothy Euler, MD  hydrOXYzine (ATARAX/VISTARIL) 25 MG tablet Take 1 tablet (25 mg total) by mouth 3 (three) times daily as needed for anxiety. 01/22/20   Lindell Spar I, NP   mometasone-formoterol (DULERA) 200-5 MCG/ACT AERO Inhale 2 puffs into the lungs 2 (two) times daily. For shortness of breath 01/22/20   Lindell Spar I, NP  ondansetron (ZOFRAN) 4 MG tablet Take 1 tablet (4 mg total) by mouth every 8 (eight) hours as needed for nausea or vomiting. 01/08/20   Loni Beckwith, PA-C  pantoprazole (PROTONIX) 40 MG tablet Take 1 tablet (40 mg total) by mouth daily. 06/16/19   Lajean Saver, MD  risperiDONE (RISPERDAL) 3 MG tablet Take 1 tablet (3 mg total) by mouth 2 (two) times daily. For mood control 01/22/20   Lindell Spar I, NP  SUMAtriptan (IMITREX) 50 MG tablet Take 50 mg by mouth 2 (two) times daily as needed for migraine. 01/12/20   [provider]  traZODone (DESYREL) 100 MG tablet Take 2 tablets (200 mg total) by mouth at bedtime. For sleep 01/22/20   Lindell Spar I, NP    Allergies    Bee venom, Coconut flavor, Geodon [ziprasidone hcl], Haloperidol and related, Lithium, Oxycodone, Quetiapine, Seroquel [quetiapine fumarate], Shellfish allergy, Phenergan [promethazine hcl], Prilosec [omeprazole], Sulfa antibiotics, Tegretol [carbamazepine], and Tape  Review of Systems   Review of Systems  Ten systems reviewed and are negative for acute change, except as noted in the HPI.    Physical Exam Updated Vital Signs BP 104/61 (BP Location: Left Arm)   Pulse 80   Temp 97.6 F (36.4 C) (Oral)   Resp 16   Ht 5\' 4"  (1.626 m)   Wt 106 kg   LMP 12/30/2019   SpO2 98%   BMI 40.11 kg/m   Physical Exam Vitals and nursing note reviewed.  Constitutional:      General: She is not in acute distress.    Appearance: She is well-developed and well-nourished. She is not diaphoretic.  HENT:     Head: Normocephalic and atraumatic.  Eyes:     General: No scleral icterus.    Extraocular Movements: EOM normal.     Conjunctiva/sclera: Conjunctivae normal.  Pulmonary:     Effort: Pulmonary effort is normal. No respiratory distress.  Musculoskeletal:         General: Normal range of motion.     Cervical back: Normal range of motion.  Skin:    General: Skin is warm and dry.     Coloration: Skin is not pale.     Findings: No erythema or rash.  Neurological:     Mental Status: She is alert and oriented to person, place, and time.  Psychiatric:        Mood and  Affect: Mood is depressed. Affect is flat.        Speech: Speech is rapid and pressured.        Behavior: Behavior is cooperative.     ED Results / Procedures / Treatments   Labs (all labs ordered are listed, but only abnormal results are displayed) Labs Reviewed  SALICYLATE LEVEL - Abnormal; Notable for the following components:      Result Value   Salicylate Lvl <6.0 (*)    All other components within normal limits  ACETAMINOPHEN LEVEL - Abnormal; Notable for the following components:   Acetaminophen (Tylenol), Serum <10 (*)    All other components within normal limits  CBC - Abnormal; Notable for the following components:   MCH 25.8 (*)    All other components within normal limits  RESP PANEL BY RT-PCR (FLU A&B, COVID) ARPGX2  COMPREHENSIVE METABOLIC PANEL  ETHANOL  RAPID URINE DRUG SCREEN, HOSP PERFORMED  I-STAT BETA HCG BLOOD, ED ([facility], [facility], [facility] ONLY)    EKG None  Radiology No results found.  Procedures Procedures (including critical care time)  Medications Ordered in ED Medications - No data to display  ED Course  I have reviewed the triage vital signs and the nursing notes.  Pertinent labs & imaging results that were available during my care of the patient were reviewed by me and considered in my medical decision making (see chart for details).    MDM Rules/Calculators/A&P                          Patient accepted for observation and further assessment at [facility] by Reita Cliche, NP. Medically cleared and stable for transfer.  6:13 AM Patient declines transfer to [facility]. Wants to wait for her ACT team to arrive in the ED. Given that patient has been assessed by TTS  within the past 24 hours for these same complaints with initial recommendation of outpatient follow-up, feel she is appropriate for discharge when her ACT team member arrives.  Signed out to [place], PA-C at shift change.   Final Clinical Impression(s) / ED Diagnoses Final diagnoses:  Severe episode of recurrent major depressive disorder, without psychotic features [facility])    Rx / DC Orders ED Discharge Orders    None       Antonietta Breach, PA-C 01/26/20 0555    Antonietta Breach, PA-C 01/26/20 4540    Orpah Greek, MD 01/26/20 [phone]

## 2020-01-26 NOTE — Progress Notes (Signed)
Pt has been psychiatrically cleared by Ricky Ala, NP. [place] spoke with Clarene Critchley at ACT [phone]) who stated that per her supervisor, ACT will not be picking her up due to pt reportedly continuing to endorse SI. However, she stated that pt could get home via bus or [facility] ED RN notified.     Audree Camel, MSW, LCSW, [place] Clinical Social Worker II Disposition CSW [phone]

## 2020-01-26 NOTE — ED Notes (Signed)
Patient belongings in [place] [address].

## 2020-01-27 ENCOUNTER — Telehealth (HOSPITAL_COMMUNITY): Payer: Self-pay | Admitting: Family Medicine

## 2020-01-27 NOTE — Telephone Encounter (Signed)
Care Management - Follow Up [facility] Discharges   Writer attempted to make contact with patient today and was unsuccessful.  Writer was able to leave a HIPPA compliant voice message and will await callback.  Per Chart review, patient continues to receive services with her ACTT Team.

## 2020-01-27 NOTE — Telephone Encounter (Signed)
Care Management - Follow Up [facility] Discharges   Writer attempted to make contact with patient today and was unsuccessful.  Writer was able to leave a HIPPA compliant voice message and will await callback.  Per chart review, patient receives services with ACTT Team

## 2020-01-29 DIAGNOSIS — R58 Hemorrhage, not elsewhere classified: Secondary | ICD-10-CM | POA: Diagnosis not present

## 2020-01-30 ENCOUNTER — Other Ambulatory Visit: Payer: Self-pay

## 2020-01-30 ENCOUNTER — Emergency Department (HOSPITAL_COMMUNITY)
Admission: EM | Admit: 2020-01-30 | Discharge: 2020-01-30 | Disposition: A | Discharge status: Home / Self Care | Payer: PPO | Attending: Emergency Medicine | Admitting: Emergency Medicine

## 2020-01-30 ENCOUNTER — Encounter (HOSPITAL_COMMUNITY): Payer: Self-pay | Admitting: Emergency Medicine

## 2020-01-30 DIAGNOSIS — R45851 Suicidal ideations: Secondary | ICD-10-CM | POA: Diagnosis not present

## 2020-01-30 DIAGNOSIS — S6992XA Unspecified injury of left wrist, hand and finger(s), initial encounter: Secondary | ICD-10-CM | POA: Diagnosis present

## 2020-01-30 DIAGNOSIS — J45909 Unspecified asthma, uncomplicated: Secondary | ICD-10-CM | POA: Diagnosis not present

## 2020-01-30 DIAGNOSIS — Z87891 Personal history of nicotine dependence: Secondary | ICD-10-CM | POA: Insufficient documentation

## 2020-01-30 DIAGNOSIS — Z7951 Long term (current) use of inhaled steroids: Secondary | ICD-10-CM | POA: Diagnosis not present

## 2020-01-30 DIAGNOSIS — X789XXA Intentional self-harm by unspecified sharp object, initial encounter: Secondary | ICD-10-CM | POA: Diagnosis not present

## 2020-01-30 DIAGNOSIS — S61512A Laceration without foreign body of left wrist, initial encounter: Secondary | ICD-10-CM | POA: Insufficient documentation

## 2020-01-30 LAB — COMPREHENSIVE METABOLIC PANEL
ALT: 16 U/L (ref 0–44)
AST: 16 U/L (ref 15–41)
Albumin: 4.3 g/dL (ref 3.5–5.0)
Alkaline Phosphatase: 64 U/L (ref 38–126)
Anion gap: 10 (ref 5–15)
BUN: 13 mg/dL (ref 6–20)
CO2: 22 mmol/L (ref 22–32)
Calcium: 9.1 mg/dL (ref 8.9–10.3)
Chloride: 108 mmol/L (ref 98–111)
Creatinine, Ser: 1.09 mg/dL — ABNORMAL HIGH (ref 0.44–1.00)
GFR, Estimated: >60 mL/min (ref >60)
Glucose, Bld: 100 mg/dL — ABNORMAL HIGH (ref 70–99)
Potassium: 3.8 mmol/L (ref 3.5–5.1)
Sodium: 140 mmol/L (ref 135–145)
Total Bilirubin: 0.6 mg/dL (ref 0.3–1.2)
Total Protein: 7.5 g/dL (ref 6.5–8.1)

## 2020-01-30 LAB — CBC
HCT: 39.8 % (ref 36.0–46.0)
Hemoglobin: 12.5 g/dL (ref 12.0–15.0)
MCH: 26 pg (ref 26.0–34.0)
MCHC: 31.4 g/dL (ref 30.0–36.0)
MCV: 82.9 fL (ref 80.0–100.0)
Platelets: 263 10*3/uL (ref 150–400)
RBC: 4.8 MIL/uL (ref 3.87–5.11)
RDW: 13.5 % (ref 11.5–15.5)
WBC: 8.1 10*3/uL (ref 4.0–10.5)
nRBC: 0 % (ref 0.0–0.2)

## 2020-01-30 LAB — I-STAT BETA HCG BLOOD, ED (MC, WL, AP ONLY): I-stat hCG, quantitative: <5 m[IU]/mL (ref <5)

## 2020-01-30 LAB — RAPID URINE DRUG SCREEN, HOSP PERFORMED
Amphetamines: NOT DETECTED
Barbiturates: NOT DETECTED
Benzodiazepines: NOT DETECTED
Cocaine: NOT DETECTED
Opiates: NOT DETECTED
Tetrahydrocannabinol: NOT DETECTED

## 2020-01-30 LAB — ACETAMINOPHEN LEVEL: Acetaminophen (Tylenol), Serum: <10 ug/mL — ABNORMAL LOW (ref 10–30)

## 2020-01-30 LAB — ETHANOL: Alcohol, Ethyl (B): <10 mg/dL (ref <10)

## 2020-01-30 LAB — SALICYLATE LEVEL: Salicylate Lvl: <7 mg/dL — ABNORMAL LOW (ref 7.0–30.0)

## 2020-01-30 MED ORDER — BACITRACIN ZINC 500 UNIT/GM EX OINT
TOPICAL_OINTMENT | CUTANEOUS | Status: AC
  Filled 2020-01-30: qty 0.9

## 2020-01-30 MED ORDER — FLUOXETINE HCL 20 MG PO CAPS
20.0000 mg | ORAL_CAPSULE | Freq: Every day | ORAL | Status: DC
  Filled 2020-01-30: qty 1

## 2020-01-30 MED ORDER — TRAZODONE HCL 100 MG PO TABS
200.0000 mg | ORAL_TABLET | Freq: Every day | ORAL | Status: DC
Start: 2020-01-31 — End: 2020-01-30

## 2020-01-30 MED ORDER — HYDROXYZINE HCL 25 MG PO TABS
25.0000 mg | ORAL_TABLET | Freq: Three times a day (TID) | ORAL | Status: DC | PRN
Start: 2020-01-30 — End: 2020-01-30
  Administered 2020-01-30: 03:00:00 25 mg via ORAL
  Filled 2020-01-30: qty 1

## 2020-01-30 MED ORDER — RISPERIDONE 2 MG PO TABS
3.0000 mg | ORAL_TABLET | Freq: Two times a day (BID) | ORAL | Status: DC
  Filled 2020-01-30: qty 1

## 2020-01-30 NOTE — ED Triage Notes (Signed)
Patient states suicidal ideation. Multiple recent admissions for same. Patient was found by room mate attempting to cut herself with a shaving razor. Superficial lacerations noted, no bleeding. Patient IVC'ed by [employer].

## 2020-01-30 NOTE — ED Notes (Signed)
[employer] transported pt home and left with her before seeing this Probation officer. VS were not done prior to discharge because she was gone already when this writer discovered that they had arrived to transport her home. Pt was cooperative.

## 2020-01-30 NOTE — ED Notes (Addendum)
Pt made attempt to walk out while whispering to herself but this Probation officer and [employer] officers present stopped her and advised her she is not allowed to leave. Pt walked back to her triage room and [employer] officers made aware to place themselves near pt's room.

## 2020-01-30 NOTE — ED Provider Notes (Signed)
[facility] DEPT Provider Note   CSN: [id number] Arrival date & time: 01/30/20  0007   Time seen 1:04 AM  History Chief Complaint  Patient presents with  . Medical Clearance    [name] is a 29 y.o. female.  HPI   Patient states she has been under a lot of stress because her deceased husband had proposed to her at Christmas time.  She states he killed himself.  Her 10-year-old daughter is being taken care of by her husband's mother and she is not allowed to see her child although she has a court order.  When I ask her what is going on today she states "arm".  And I said what is wrong with your arm she states "cut".  When I try to get more details she talks very softly and she is hard to hear and understand.  She did at some point states she cut her wrist's because she wanted to die.  She states her tetanus immunization is up-to-date.  She states she goes to [facility] and has an act team.  She states she called them and they told her to call the crisis line.  She states "I have been told I called the crisis line too much lately".  So she came to the ED after cutting herself.  PCP Kathyrn Lass, MD   Past Medical History:  Diagnosis Date  . Acid reflux   . Anxiety   . Asthma    last attack 03/13/15 or 03/14/15  . Autism   . 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 Chronic constipation   . Depression   . Drug-seeking behavior   . Essential tremor   . Headache   . Overdose of acetaminophen 07/2017   and other meds  . Personality disorder ([place])   . Schizo-affective psychosis ([place])   . Schizoaffective disorder, bipolar type ([place])   . Seizures [facility])    Last seizure December 2017  . Sleep apnea     Patient Active Problem List   Diagnosis Date Noted  . MDD (major depressive disorder), recurrent episode, severe ([place]) 01/17/2020  . Adjustment disorder with mixed disturbance of emotions and conduct 08/03/2019  . Overdose 07/22/2017  . Intentional  acetaminophen overdose ([place])   . DUB (dysfunctional uterine bleeding) 11/22/2016  . Hyperprolactinemia ([place]) 08/20/2016  . Tachycardia 01/13/2016  . 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  . Seizure disorder ([place]) 08/08/2015  . Chronic migraine 07/27/2015  . Asthma 04/15/2015  . Schizoaffective disorder, bipolar type ([place]) 03/10/2014  . PTSD (post-traumatic stress disorder) 03/10/2014  . Suicidal ideations   . Borderline personality disorder ([place]) 10/31/2013  . Autism spectrum disorder 06/15/2013    Past Surgical History:  Procedure Laterality Date  . MOUTH SURGERY  2009 or 2010     OB History    Gravida  2   Para  1   Term  1   Preterm      AB  1   Living  1     SAB  1   IAB      Ectopic      Multiple  0   Live Births  1           Family History  Problem Relation Age of Onset  . Mental illness Father   . Asthma Father   . PDD Brother   . Seizures Brother     Social History   Tobacco Use  . Smoking status:  Former Smoker    Packs/day: 0.00  . Smokeless tobacco: Never Used  . Tobacco comment: Smoked for 2  years age [age]  Vaping Use  . Vaping Use: Never used  Substance Use Topics  . Alcohol use: No    Alcohol/week: 1.0 standard drink    Types: 1 Standard drinks or equivalent per week    Comment: denies at this time  . Drug use: No    Comment: History of cocaine use at age 79 for 4 months    Home Medications Prior to Admission medications   Medication Sig Start Date End Date Taking? Authorizing Provider  albuterol (PROVENTIL HFA;VENTOLIN HFA) 108 (90 Base) MCG/ACT inhaler Inhale 1-2 puffs into the lungs every 6 (six) hours as needed for wheezing or shortness of breath.    [provider]  benzocaine (ORAJEL) 10 % mucosal gel Use as directed in the mouth or throat 4 (four) times daily as needed for mouth pain. 01/22/20   Lindell Spar I, NP  FLUoxetine (PROZAC) 20 MG capsule Take 1 capsule (20 mg total) by mouth daily. For  depression 01/23/20   Lindell Spar I, NP  fluticasone-salmeterol (ADVAIR HFA) 734-19 MCG/ACT inhaler Inhale 2 puffs into the lungs 2 (two) times daily. 08/03/15   Timmothy Euler, MD  hydrOXYzine (ATARAX/VISTARIL) 25 MG tablet Take 1 tablet (25 mg total) by mouth 3 (three) times daily as needed for anxiety. 01/22/20   Lindell Spar I, NP  mometasone-formoterol (DULERA) 200-5 MCG/ACT AERO Inhale 2 puffs into the lungs 2 (two) times daily. For shortness of breath 01/22/20   Lindell Spar I, NP  ondansetron (ZOFRAN) 4 MG tablet Take 1 tablet (4 mg total) by mouth every 8 (eight) hours as needed for nausea or vomiting. 01/08/20   Loni Beckwith, PA-C  pantoprazole (PROTONIX) 40 MG tablet Take 1 tablet (40 mg total) by mouth daily. 06/16/19   Lajean Saver, MD  risperiDONE (RISPERDAL) 3 MG tablet Take 1 tablet (3 mg total) by mouth 2 (two) times daily. For mood control 01/22/20   Lindell Spar I, NP  SUMAtriptan (IMITREX) 50 MG tablet Take 50 mg by mouth 2 (two) times daily as needed for migraine. 01/12/20   [provider]  traZODone (DESYREL) 100 MG tablet Take 2 tablets (200 mg total) by mouth at bedtime. For sleep 01/22/20   Lindell Spar I, NP    Allergies    Bee venom, Coconut flavor, Geodon [ziprasidone hcl], Haloperidol and related, Lithium, Oxycodone, Quetiapine, Seroquel [quetiapine fumarate], Shellfish allergy, Phenergan [promethazine hcl], Prilosec [omeprazole], Sulfa antibiotics, Tegretol [carbamazepine], and Tape  Review of Systems   Review of Systems  All other systems reviewed and are negative.   Physical Exam Updated Vital Signs BP 117/77 (BP Location: Right Arm)   Pulse (!) 106   Temp 98.6 F (37 C) (Oral)   Resp 18   SpO2 100%   Physical Exam Vitals and nursing note reviewed.  Constitutional:      Appearance: She is obese.  HENT:     Head: Normocephalic and atraumatic.     Right Ear: External ear normal.     Left Ear: External ear normal.  Eyes:      Extraocular Movements: Extraocular movements intact.     Conjunctiva/sclera: Conjunctivae normal.     Pupils: Pupils are equal, round, and reactive to light.  Cardiovascular:     Rate and Rhythm: Normal rate.  Pulmonary:     Effort: Pulmonary effort is normal.  Musculoskeletal:  Cervical back: Normal range of motion.  Skin:    General: Skin is warm and dry.     Comments: Patient has the smallest superficial epidermal abrasion that is less than half centimeter in size on the volar aspect of her left wrist on the ulnar side.  Neurological:     General: No focal deficit present.     Mental Status: She is oriented to person, place, and time.     Cranial Nerves: No cranial nerve deficit.  Psychiatric:        Mood and Affect: Affect is flat.     Comments: Speech is fast and soft and hard to understand.  She does not offer a lot of information.       ED Results / Procedures / Treatments   Labs (all labs ordered are listed, but only abnormal results are displayed) Results for orders placed or performed during the hospital encounter of 01/30/20  Comprehensive metabolic panel  Result Value Ref Range   Sodium 140 135 - 145 mmol/L   Potassium 3.8 3.5 - 5.1 mmol/L   Chloride 108 98 - 111 mmol/L   CO2 22 22 - 32 mmol/L   Glucose, Bld 100 (H) 70 - 99 mg/dL   BUN 13 6 - 20 mg/dL   Creatinine, Ser 1.09 (H) 0.44 - 1.00 mg/dL   Calcium 9.1 8.9 - 10.3 mg/dL   Total Protein 7.5 6.5 - 8.1 g/dL   Albumin 4.3 3.5 - 5.0 g/dL   AST 16 15 - 41 U/L   ALT 16 0 - 44 U/L   Alkaline Phosphatase 64 38 - 126 U/L   Total Bilirubin 0.6 0.3 - 1.2 mg/dL   GFR, Estimated >60 >60 mL/min   Anion gap 10 5 - 15  Ethanol  Result Value Ref Range   Alcohol, Ethyl (B) Q000111Q Q000111Q mg/dL  Salicylate level  Result Value Ref Range   Salicylate Lvl Q000111Q (L) 7.0 - 30.0 mg/dL  Acetaminophen level  Result Value Ref Range   Acetaminophen (Tylenol), Serum <10 (L) 10 - 30 ug/mL  cbc  Result Value Ref Range   WBC 8.1  4.0 - 10.5 K/uL   RBC 4.80 3.87 - 5.11 MIL/uL   Hemoglobin 12.5 12.0 - 15.0 g/dL   HCT 39.8 36.0 - 46.0 %   MCV 82.9 80.0 - 100.0 fL   MCH 26.0 26.0 - 34.0 pg   MCHC 31.4 30.0 - 36.0 g/dL   RDW 13.5 11.5 - 15.5 %   Platelets 263 150 - 400 K/uL   nRBC 0.0 0.0 - 0.2 %  Rapid urine drug screen (hospital performed)  Result Value Ref Range   Opiates NONE DETECTED NONE DETECTED   Cocaine NONE DETECTED NONE DETECTED   Benzodiazepines NONE DETECTED NONE DETECTED   Amphetamines NONE DETECTED NONE DETECTED   Tetrahydrocannabinol NONE DETECTED NONE DETECTED   Barbiturates NONE DETECTED NONE DETECTED  I-Stat beta hCG blood, ED  Result Value Ref Range   I-stat hCG, quantitative <5.0 <5 mIU/mL   Comment 3           Laboratory interpretation all normal    EKG None  Radiology No results found.  Procedures Procedures (including critical care time)  Medications Ordered in ED Medications  FLUoxetine (PROZAC) capsule 20 mg (has no administration in time range)  hydrOXYzine (ATARAX/VISTARIL) tablet 25 mg (25 mg Oral Given 01/30/20 0315)  risperiDONE (RISPERDAL) tablet 3 mg (has no administration in time range)  traZODone (DESYREL) tablet 200 mg (has  no administration in time range)  bacitracin 500 UNIT/GM ointment (  Given 01/30/20 0251)    ED Course  I have reviewed the triage vital signs and the nursing notes.  Pertinent labs & imaging results that were available during my care of the patient were reviewed by me and considered in my medical decision making (see chart for details).    MDM Rules/Calculators/A&P                         Patient's home medications were written.  TTS consult was ordered.  Patient had basic wound care for her abrasions.  Psych holding orders were written.  Final Clinical Impression(s) / ED Diagnoses Final diagnoses:  Self-inflicted laceration of wrist, initial encounter ([place])  Suicidal ideation    Rx / DC Orders   Plan patient is awaiting TTS  consult for disposition.  Plan discharge     Rolland Porter, MD 01/30/20 [phone]

## 2020-01-30 NOTE — ED Provider Notes (Signed)
Patient cleared by psych and discharged.   Lennice Sites, DO 01/30/20 1239

## 2020-01-30 NOTE — Consult Note (Addendum)
[facility] Face-to-Face Psychiatry Consult   Reason for Consult:  Suicidal Ideations  Referring Physician:  EPD Patient Identification: [name] MRN:  [id number] Principal Diagnosis: <principal problem not specified> Diagnosis:  Active Problems:   * No active hospital problems. *   Total Time spent with patient: 15 minutes  Subjective:   [name] is a 29 y.o. female presents with chronic suicidal ideation. Patient is diagnosed with Schizoaffective, borderline personality disorder and major depressive disorder and is chronic suicidal. [name] is well know to this services. [name]  states she did not want to be alone, however it was reported that she has allowed her ex-boyfriend to move back in.  Currently she is denying suicidal or homicidal ideations.  Denies auditory or visual hallucinations.  Reports recent medication adjustment by her ACTT provider andshe stated they discontinue Zoloft and she was restarted on Prozac.  States they decreased her Risperdal because this medication was too sedating. Case was staffed with Attending Psychiatrist Dwyane Dee. Support, encouragement and reassurance was provided.  NP spoke with Linward Natal from [facility] services on 01/30/2020, stated " I don't know what we are able to do at this time, we have a meeting scheduled with [place] for this week."   Stated that she is unable to transport patient back to her resident.   01/30/2020 evaluation patient is awake, alert and x3.  Denying suicidal or homicidal ideations. Reports " I doing better today."  My ATT team dosn't believe me." Patient present with a brighter affect today. [name] to keep all follow-up appointment with ATT services.   Past Psychiatric History:   Risk to Self:   Risk to Others:   Prior Inpatient Therapy:   Prior Outpatient Therapy:    Past Medical History:  Past Medical History:  Diagnosis Date  . Acid reflux   . Anxiety   . Asthma    last attack 03/13/15 or 03/14/15  .  Autism   . 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 Chronic constipation   . Depression   . Drug-seeking behavior   . Essential tremor   . Headache   . Overdose of acetaminophen 07/2017   and other meds  . Personality disorder ([place])   . Schizo-affective psychosis ([place])   . Schizoaffective disorder, bipolar type ([place])   . Seizures [facility])    Last seizure December 2017  . Sleep apnea     Past Surgical History:  Procedure Laterality Date  . MOUTH SURGERY  2009 or 2010   Family History:  Family History  Problem Relation Age of Onset  . Mental illness Father   . Asthma Father   . PDD Brother   . Seizures Brother    Family Psychiatric  History:  Social History:  Social History   Substance and Sexual Activity  Alcohol Use No  . Alcohol/week: 1.0 standard drink  . Types: 1 Standard drinks or equivalent per week   Comment: denies at this time     Social History   Substance and Sexual Activity  Drug Use No   Comment: History of cocaine use at age 53 for 4 months    Social History   Socioeconomic History  . Marital status: Widowed    Spouse name: Not on file  . Number of children: 0  . Years of education: Not on file  . Highest education level: Not on file  Occupational History  . Occupation: disability  Tobacco Use  . Smoking status: Former Smoker  Packs/day: 0.00  . Smokeless tobacco: Never Used  . Tobacco comment: Smoked for 2  years age [age]  Vaping Use  . Vaping Use: Never used  Substance and Sexual Activity  . Alcohol use: No    Alcohol/week: 1.0 standard drink    Types: 1 Standard drinks or equivalent per week    Comment: denies at this time  . Drug use: No    Comment: History of cocaine use at age 31 for 4 months  . Sexual activity: Yes    Birth control/protection: None  Other Topics Concern  . Not on file  Social History Narrative   Marital status: Widowed      Children: daughter      Lives: with boyfriend, in two story home      Employment:   Disability      Tobacco: quit smoking; smoked for two years.      Alcohol ;none      Drugs: none   Has not traveled outside of the country.   Right handed         Social Determinants of Health   Financial Resource Strain: Not on file  Food Insecurity: Not on file  Transportation Needs: Not on file  Physical Activity: Not on file  Stress: Not on file  Social Connections: Not on file   Additional Social History:    Allergies:   Allergies  Allergen Reactions  . Bee Venom Anaphylaxis  . Coconut Flavor Anaphylaxis and Rash  . Geodon [Ziprasidone Hcl] Other (See Comments)    Pt states that this medication causes paralysis of the mouth.    . Haloperidol And Related Other (See Comments)    Pt states that this medication causes paralysis of the mouth.    . Lithium Other (See Comments)    Reaction:  Seizure-like activity.    . Oxycodone Other (See Comments)    Reaction:  Hallucinations   . Quetiapine Other (See Comments)    Pt states that this medication is too strong.   . Seroquel [Quetiapine Fumarate] Other (See Comments)    Pt states that this medication is too strong.   . Shellfish Allergy Anaphylaxis  . Phenergan [Promethazine Hcl] Other (See Comments)    Reaction:  Chest pain    . Prilosec [Omeprazole] Nausea And Vomiting  . Sulfa Antibiotics Other (See Comments)    Chest pain   . Tegretol [Carbamazepine] Nausea And Vomiting  . Tape Other (See Comments)    Skin tears, can only tolerate paper tape.    Labs:  Results for orders placed or performed during the hospital encounter of 01/30/20 (from the past 48 hour(s))  Comprehensive metabolic panel     Status: Abnormal   Collection Time: 01/30/20 12:49 AM  Result Value Ref Range   Sodium 140 135 - 145 mmol/L   Potassium 3.8 3.5 - 5.1 mmol/L   Chloride 108 98 - 111 mmol/L   CO2 22 22 - 32 mmol/L   Glucose, Bld 100 (H) 70 - 99 mg/dL    Comment: Glucose reference range applies only to samples taken after fasting for at  least 8 hours.   BUN 13 6 - 20 mg/dL   Creatinine, Ser 6.55 (H) 0.44 - 1.00 mg/dL   Calcium 9.1 8.9 - 37.4 mg/dL   Total Protein 7.5 6.5 - 8.1 g/dL   Albumin 4.3 3.5 - 5.0 g/dL   AST 16 15 - 41 U/L   ALT 16 0 - 44 U/L   Alkaline  Phosphatase 64 38 - 126 U/L   Total Bilirubin 0.6 0.3 - 1.2 mg/dL   GFR, Estimated >60 >60 mL/min    Comment: (NOTE) Calculated using the CKD-EPI Creatinine Equation (2021)    Anion gap 10 5 - 15    Comment: Performed at [facility], [place] [address] [place] [address] [place], [place] [address]  Ethanol     Status: None   Collection Time: 01/30/20 12:49 AM  Result Value Ref Range   Alcohol, Ethyl (B) <10 <10 mg/dL    Comment: (NOTE) Lowest detectable limit for serum alcohol is 10 mg/dL.  For medical purposes only. Performed at [facility], [place] [address], [place], [place] [id number]   Salicylate level     Status: Abnormal   Collection Time: 01/30/20 12:49 AM  Result Value Ref Range   Salicylate Lvl Q000111Q (L) 7.0 - 30.0 mg/dL    Comment: Performed at [facility], [place] [address], [place], [place] [address]  Acetaminophen level     Status: Abnormal   Collection Time: 01/30/20 12:49 AM  Result Value Ref Range   Acetaminophen (Tylenol), Serum <10 (L) 10 - 30 ug/mL    Comment: (NOTE) Therapeutic concentrations vary significantly. A range of 10-30 ug/mL  may be an effective concentration for many patients. However, some  are best treated at concentrations outside of this range. Acetaminophen concentrations >150 ug/mL at 4 hours after ingestion  and >50 ug/mL at 12 hours after ingestion are often associated with  toxic reactions.  Performed at [facility], [place] [address] [place], [place] [address]   cbc     Status: None   Collection Time: 01/30/20 12:49 AM  Result Value Ref Range   WBC 8.1 4.0 - 10.5 K/uL   RBC 4.80 3.87 - 5.11 MIL/uL   Hemoglobin 12.5 12.0 - 15.0 g/dL   HCT 39.8 36.0 -  46.0 %   MCV 82.9 80.0 - 100.0 fL   MCH 26.0 26.0 - 34.0 pg   MCHC 31.4 30.0 - 36.0 g/dL   RDW 13.5 11.5 - 15.5 %   Platelets 263 150 - 400 K/uL   nRBC 0.0 0.0 - 0.2 %    Comment: Performed at [facility] [place] [address] [place], [place] [address]  Rapid urine drug screen (hospital performed)     Status: None   Collection Time: 01/30/20 12:49 AM  Result Value Ref Range   Opiates NONE DETECTED NONE DETECTED   Cocaine NONE DETECTED NONE DETECTED   Benzodiazepines NONE DETECTED NONE DETECTED   Amphetamines NONE DETECTED NONE DETECTED   Tetrahydrocannabinol NONE DETECTED NONE DETECTED   Barbiturates NONE DETECTED NONE DETECTED    Comment: (NOTE) DRUG SCREEN FOR MEDICAL PURPOSES ONLY.  IF CONFIRMATION IS NEEDED FOR ANY PURPOSE, NOTIFY LAB WITHIN 5 DAYS.  LOWEST DETECTABLE LIMITS FOR URINE DRUG SCREEN Drug Class                     Cutoff (ng/mL) Amphetamine and metabolites    1000 Barbiturate and metabolites    200 Benzodiazepine                 [id number] Tricyclics and metabolites     300 Opiates and metabolites        300 Cocaine and metabolites        300 THC  50 Performed at [facility], [place] [address] [place], [place] [address]   I-Stat beta hCG blood, ED     Status: None   Collection Time: 01/30/20 12:56 AM  Result Value Ref Range   I-stat hCG, quantitative <5.0 <5 mIU/mL   Comment 3            Comment:   GEST. AGE      CONC.  (mIU/mL)   <=1 WEEK        5 - 50     2 WEEKS       50 - 500     3 WEEKS       100 - 10,000     4 WEEKS     1,000 - 30,000        FEMALE AND NON-PREGNANT FEMALE:     LESS THAN 5 mIU/mL     Current Facility-Administered Medications  Medication Dose Route Frequency Provider Last Rate Last Admin  . FLUoxetine (PROZAC) capsule 20 mg  20 mg Oral Daily Rolland Porter, MD      . hydrOXYzine (ATARAX/VISTARIL) tablet 25 mg  25 mg Oral TID PRN Rolland Porter, MD   25 mg at 01/30/20 0315  .  risperiDONE (RISPERDAL) tablet 3 mg  3 mg Oral BID Rolland Porter, MD      . Derrill Memo ON 01/31/2020] traZODone (DESYREL) tablet 200 mg  200 mg Oral QHS Rolland Porter, MD       Current Outpatient Medications  Medication Sig Dispense Refill  . albuterol (PROVENTIL HFA;VENTOLIN HFA) 108 (90 Base) MCG/ACT inhaler Inhale 1-2 puffs into the lungs every 6 (six) hours as needed for wheezing or shortness of breath.    . benzocaine (ORAJEL) 10 % mucosal gel Use as directed in the mouth or throat 4 (four) times daily as needed for mouth pain. 5.3 g 0  . FLUoxetine (PROZAC) 20 MG capsule Take 1 capsule (20 mg total) by mouth daily. For depression 30 capsule 0  . fluticasone-salmeterol (ADVAIR HFA) 115-21 MCG/ACT inhaler Inhale 2 puffs into the lungs 2 (two) times daily. 1 Inhaler 5  . hydrOXYzine (ATARAX/VISTARIL) 25 MG tablet Take 1 tablet (25 mg total) by mouth 3 (three) times daily as needed for anxiety. 75 tablet 0  . mometasone-formoterol (DULERA) 200-5 MCG/ACT AERO Inhale 2 puffs into the lungs 2 (two) times daily. For shortness of breath 1 each 0  . ondansetron (ZOFRAN) 4 MG tablet Take 1 tablet (4 mg total) by mouth every 8 (eight) hours as needed for nausea or vomiting. 12 tablet 0  . pantoprazole (PROTONIX) 40 MG tablet Take 1 tablet (40 mg total) by mouth daily. 30 tablet 0  . risperiDONE (RISPERDAL) 3 MG tablet Take 1 tablet (3 mg total) by mouth 2 (two) times daily. For mood control 60 tablet 0  . SUMAtriptan (IMITREX) 50 MG tablet Take 50 mg by mouth 2 (two) times daily as needed for migraine.    . traZODone (DESYREL) 100 MG tablet Take 2 tablets (200 mg total) by mouth at bedtime. For sleep 30 tablet 0    Musculoskeletal: Strength & Muscle Tone: within normal limits Gait & Station: normal Patient leans: N/A  Psychiatric Specialty Exam: Physical Exam Vitals reviewed.  Neurological:     Mental Status: She is alert.     Review of Systems  Psychiatric/Behavioral: Negative for suicidal ideas. The  patient is nervous/anxious.   All other systems reviewed and are negative.   Blood pressure 117/77, pulse Marland Kitchen)  106, temperature 98.6 F (37 C), temperature source Oral, resp. rate 18, SpO2 100 %.There is no height or weight on file to calculate BMI.  General Appearance: Casual  Eye Contact:  Good  Speech:  Clear and Coherent  Volume:  Normal  Mood:  Anxious and Depressed  Affect:  Congruent  Thought Process:  Coherent  Orientation:  Full (Time, Place, and Person)  Thought Content:  Logical  Suicidal Thoughts:  No currently denying, however history with   Homicidal Thoughts:  No  Memory:  Immediate;   Fair Recent;   Fair  Judgement:  Fair  Insight:  Fair  Psychomotor Activity:  Normal  Concentration:  Concentration: Fair  Recall:  [employer] of Knowledge:  Fair  Language:  Fair  Akathisia:  No  Handed:  Right  AIMS (if indicated):     Assets:  Communication Skills Resilience Social Support  ADL's:  Intact  Cognition:  WNL  Sleep:      - Consult for social work for transportation  - Keep f/u appt with ACTT  Disposition: No evidence of imminent risk to self or others at present.   Patient does not meet criteria for psychiatric inpatient admission. Supportive therapy provided about ongoing stressors. Refer to IOP. Discussed crisis plan, support from social network, calling 911, coming to the Emergency Department, and calling Suicide Hotline.  Derrill [place], NP 01/30/2020 10:35 AM

## 2020-01-30 NOTE — ED Notes (Signed)
Patient's wrist noted to be abraded. Bacitracin, telfa and coban used to dress patient's abrasion.

## 2020-01-30 NOTE — ED Notes (Addendum)
Sharyne Peach with [facility] and said that Nicol has been exhausting for them. She blows up their crisis phone for attention, is constantly doing things to seek attention, like cutting or scratching herself superficially or taking more medications than prescribed, but not enough to be fatal. [facility] does not feel that they are equipped to work with someone whose Borderline PD is as pronounced as pt's. They are trying to get [facility] to provide more appropriate management of her care. They adamantly refused to provide transportation home for her today.   Today she denies SI/HI/AVH and states that she is ready to go home.

## 2020-01-30 NOTE — ED Notes (Signed)
Pt discharged home. Discharged instructions read to pt who verbalized understanding. All belongings returned to pt. Denies SI/HI, is not delusional and not responding to internal stimuli. Escorted pt to the ED exit.   

## 2020-02-01 DIAGNOSIS — T1491XA Suicide attempt, initial encounter: Secondary | ICD-10-CM | POA: Diagnosis not present

## 2020-02-03 ENCOUNTER — Encounter (HOSPITAL_COMMUNITY): Payer: Self-pay

## 2020-02-03 ENCOUNTER — Other Ambulatory Visit: Payer: Self-pay

## 2020-02-03 ENCOUNTER — Ambulatory Visit (HOSPITAL_COMMUNITY)
Admission: EM | Admit: 2020-02-03 | Discharge: 2020-02-04 | Disposition: A | Discharge status: Other Acute Inpatient Hospital | Payer: PPO | Attending: Nurse Practitioner | Admitting: Nurse Practitioner

## 2020-02-03 DIAGNOSIS — F603 Borderline personality disorder: Secondary | ICD-10-CM | POA: Insufficient documentation

## 2020-02-03 DIAGNOSIS — F332 Major depressive disorder, recurrent severe without psychotic features: Secondary | ICD-10-CM

## 2020-02-03 DIAGNOSIS — F840 Autistic disorder: Secondary | ICD-10-CM | POA: Insufficient documentation

## 2020-02-03 DIAGNOSIS — F259 Schizoaffective disorder, unspecified: Secondary | ICD-10-CM | POA: Diagnosis not present

## 2020-02-03 DIAGNOSIS — Z87891 Personal history of nicotine dependence: Secondary | ICD-10-CM | POA: Diagnosis not present

## 2020-02-03 DIAGNOSIS — Z20822 Contact with and (suspected) exposure to covid-19: Secondary | ICD-10-CM | POA: Insufficient documentation

## 2020-02-03 LAB — CBC WITH DIFFERENTIAL/PLATELET
Abs Immature Granulocytes: 0.03 10*3/uL (ref 0.00–0.07)
Basophils Absolute: 0 10*3/uL (ref 0.0–0.1)
Basophils Relative: 1 %
Eosinophils Absolute: 0.1 10*3/uL (ref 0.0–0.5)
Eosinophils Relative: 1 %
HCT: 41.3 % (ref 36.0–46.0)
Hemoglobin: 13.4 g/dL (ref 12.0–15.0)
Immature Granulocytes: 0 %
Lymphocytes Relative: 34 %
Lymphs Abs: 2.7 10*3/uL (ref 0.7–4.0)
MCH: 26.3 pg (ref 26.0–34.0)
MCHC: 32.4 g/dL (ref 30.0–36.0)
MCV: 81.1 fL (ref 80.0–100.0)
Monocytes Absolute: 0.4 10*3/uL (ref 0.1–1.0)
Monocytes Relative: 5 %
Neutro Abs: 4.7 10*3/uL (ref 1.7–7.7)
Neutrophils Relative %: 59 %
Platelets: 304 10*3/uL (ref 150–400)
RBC: 5.09 MIL/uL (ref 3.87–5.11)
RDW: 13.2 % (ref 11.5–15.5)
WBC: 7.9 10*3/uL (ref 4.0–10.5)
nRBC: 0 % (ref 0.0–0.2)

## 2020-02-03 LAB — COMPREHENSIVE METABOLIC PANEL
ALT: 17 U/L (ref 0–44)
AST: 16 U/L (ref 15–41)
Albumin: 4.4 g/dL (ref 3.5–5.0)
Alkaline Phosphatase: 73 U/L (ref 38–126)
Anion gap: 14 (ref 5–15)
BUN: 9 mg/dL (ref 6–20)
CO2: 22 mmol/L (ref 22–32)
Calcium: 9.7 mg/dL (ref 8.9–10.3)
Chloride: 102 mmol/L (ref 98–111)
Creatinine, Ser: 0.69 mg/dL (ref 0.44–1.00)
GFR, Estimated: >60 mL/min (ref >60)
Glucose, Bld: 86 mg/dL (ref 70–99)
Potassium: 3.4 mmol/L — ABNORMAL LOW (ref 3.5–5.1)
Sodium: 138 mmol/L (ref 135–145)
Total Bilirubin: 0.4 mg/dL (ref 0.3–1.2)
Total Protein: 7.8 g/dL (ref 6.5–8.1)

## 2020-02-03 LAB — POCT URINE DRUG SCREEN - MANUAL ENTRY (I-SCREEN)
POC Amphetamine UR: NOT DETECTED
POC Buprenorphine (BUP): NOT DETECTED
POC Cocaine UR: NOT DETECTED
POC Marijuana UR: NOT DETECTED
POC Methadone UR: NOT DETECTED
POC Methamphetamine UR: NOT DETECTED
POC Morphine: NOT DETECTED
POC Oxazepam (BZO): NOT DETECTED
POC Oxycodone UR: NOT DETECTED
POC Secobarbital (BAR): NOT DETECTED

## 2020-02-03 LAB — RESP PANEL BY RT-PCR (FLU A&B, COVID) ARPGX2
Influenza A by PCR: NEGATIVE
Influenza B by PCR: NEGATIVE
SARS Coronavirus 2 by RT PCR: NEGATIVE

## 2020-02-03 LAB — POCT PREGNANCY, URINE: Preg Test, Ur: NEGATIVE

## 2020-02-03 MED ORDER — MAGNESIUM HYDROXIDE 400 MG/5ML PO SUSP: 30.0000 mL | Freq: Every day | ORAL | Status: DC | PRN

## 2020-02-03 MED ORDER — ACETAMINOPHEN 325 MG PO TABS: 650.0000 mg | ORAL_TABLET | Freq: Four times a day (QID) | ORAL | Status: DC | PRN

## 2020-02-03 MED ORDER — IBUPROFEN 400 MG PO TABS: 400.0000 mg | ORAL_TABLET | Freq: Four times a day (QID) | ORAL | Status: DC | PRN

## 2020-02-03 MED ORDER — PANTOPRAZOLE SODIUM 40 MG PO TBEC
40.0000 mg | DELAYED_RELEASE_TABLET | Freq: Every day | ORAL | Status: DC
  Administered 2020-02-03 – 2020-02-04 (×2): 40 mg via ORAL
  Filled 2020-02-03 (×2): qty 1

## 2020-02-03 MED ORDER — BENZOCAINE 10 % MT GEL: Freq: Four times a day (QID) | OROMUCOSAL | Status: DC | PRN

## 2020-02-03 MED ORDER — RISPERIDONE 3 MG PO TABS
3.0000 mg | ORAL_TABLET | Freq: Every day | ORAL | Status: DC
  Administered 2020-02-03 (×2): 3 mg via ORAL
  Filled 2020-02-03 (×2): qty 1

## 2020-02-03 MED ORDER — ONDANSETRON HCL 4 MG PO TABS: 4.0000 mg | ORAL_TABLET | Freq: Three times a day (TID) | ORAL | Status: DC | PRN

## 2020-02-03 MED ORDER — BACITRACIN-NEOMYCIN-POLYMYXIN 400-5-5000 EX OINT
1.0000 "application " | TOPICAL_OINTMENT | CUTANEOUS | Status: DC | PRN
  Administered 2020-02-03: 1 via TOPICAL
  Filled 2020-02-03 (×2): qty 1

## 2020-02-03 MED ORDER — SUMATRIPTAN SUCCINATE 50 MG PO TABS
50.0000 mg | ORAL_TABLET | Freq: Two times a day (BID) | ORAL | Status: DC | PRN
  Administered 2020-02-03 – 2020-02-04 (×2): 50 mg via ORAL
  Filled 2020-02-03 (×2): qty 1

## 2020-02-03 MED ORDER — MOMETASONE FURO-FORMOTEROL FUM 200-5 MCG/ACT IN AERO
2.0000 | INHALATION_SPRAY | Freq: Two times a day (BID) | RESPIRATORY_TRACT | Status: DC
  Administered 2020-02-03 – 2020-02-04 (×3): 2 via RESPIRATORY_TRACT
  Filled 2020-02-03: qty 8.8

## 2020-02-03 MED ORDER — ALBUTEROL SULFATE HFA 108 (90 BASE) MCG/ACT IN AERS: 1.0000 | INHALATION_SPRAY | Freq: Four times a day (QID) | RESPIRATORY_TRACT | Status: DC | PRN

## 2020-02-03 MED ORDER — SERTRALINE HCL 50 MG PO TABS
150.0000 mg | ORAL_TABLET | Freq: Every day | ORAL | Status: DC
  Administered 2020-02-03 – 2020-02-04 (×2): 150 mg via ORAL
  Filled 2020-02-03 (×2): qty 1

## 2020-02-03 MED ORDER — ALUM & MAG HYDROXIDE-SIMETH 200-200-20 MG/5ML PO SUSP: 30.0000 mL | ORAL | Status: DC | PRN

## 2020-02-03 MED ORDER — HYDROXYZINE HCL 25 MG PO TABS
25.0000 mg | ORAL_TABLET | Freq: Three times a day (TID) | ORAL | Status: DC | PRN
  Administered 2020-02-03 (×2): 25 mg via ORAL
  Filled 2020-02-03 (×2): qty 1

## 2020-02-03 MED ORDER — TRAZODONE HCL 100 MG PO TABS
200.0000 mg | ORAL_TABLET | Freq: Every day | ORAL | Status: DC
  Administered 2020-02-03 (×2): 200 mg via ORAL
  Filled 2020-02-03 (×2): qty 2

## 2020-02-03 NOTE — ED Notes (Signed)
Patient in room 125.  Patient lying on the floor crying.  Agreed to give urine and sit at table for testing.  While staff not present, patient knocked over chair.  Patient stated that she knows she'll be discharged in the morning and she's tired of the revolving door.  Patient then knocked over 2 more chairs. Barbara Cower, NP and nursing staff in to speak with patient.  Security present.  Patient agreed to complete testing.  Skin check completed and patient ambulated to unit.  Patient oriented to unit and unit routine.  Patient offered food and fluids and given PO Vistaril.  Patient currently resting quietly with eyes closed.

## 2020-02-03 NOTE — ED Provider Notes (Signed)
Behavioral Health Admission H&P [facility] & OBS)  Date: 02/03/20 Patient Name: [name] MRN: [id number] Chief Complaint: No chief complaint on file.     Diagnoses:  Final diagnoses:  Severe recurrent major depression without psychotic features ([place])  Borderline personality disorder (HCC)    HPI: [name] is a 29 y.o. female who presents to [facility] voluntarily with law enforcement due to [place]. Patient is well known to behavioral health. She has presented to [facility] 4 times and the ED 4 times since 01/13/20. Patient reports that she is suicidal with a plan to stab herself. She states "not getting the help I need" as a trigger for SI. Patient is alert and oriented x 4. Speech is coherent, but pressured. Reports mood as depressed and anxious. Affect is congruent with mood. She denies auditory and visual hallucinations. She does not appear to be responding to internal stimuli. She reports homicidal ideations toward the police for "being mean to me." She denies any homicidal intent or plan. She reports that she has been compliant with medication since discharge from [facility] on 01/22/20. However, she states that she did not take her medicine today. She states that she has followed up with her ACT Team. Reports that she has requested that her ACT team assist her with group home placement. States that she currently lives alone in her house.    Patient was initially much more calm and cooperative than previous assessments. However, a few minutes after this provider completed her evaluation, she began screaming, hitting the walls,and knocking over chairs is the assessment room. Patient is extremely tearful. She states that she is tired of living and prays everyday that God will take her. Encouraged patient to take Risperdal and trazodone. After several minutes, the patient became more cooperative with nursing staff and participated in admission process and took medications.   PHQ 2-9:  [place] ED from  02/03/2020 in [facility] ED from 07/26/2019 in [facility] Office Visit from 08/20/2016 in [place] for [facility]  Thoughts that you would be better off dead, or of hurting yourself in some way Nearly every day  [Phreesia 02/03/2020] Nearly every day Not at all  PHQ-9 Total Score 27 15 1       [place] ED from 02/03/2020 in [facility] ED from 01/30/2020 in [name] DEPT ED from 01/25/2020 in [place] High Risk Error: Q7 should not be populated when Q6 is No High Risk       Total Time spent with patient: 20 minutes  Musculoskeletal  Strength & Muscle Tone: within normal limits Gait & Station: normal Patient leans: N/A  Psychiatric Specialty Exam  Presentation General Appearance: Appropriate for Environment; Neat  Eye Contact:Fair  Speech:Clear and Coherent  Speech Volume:Normal  Handedness:Left   Mood and Affect  Mood:Anxious; Depressed  Affect:Congruent   Thought Process  Thought Processes:Coherent  Descriptions of Associations:Intact  Orientation:Full (Time, Place and Person)  Thought Content:Logical  Hallucinations:Hallucinations: None  Ideas of Reference:None  Suicidal Thoughts:Suicidal Thoughts: Yes, Active SI Active Intent and/or Plan: With Intent; With Plan; With Means to Carry Out  Homicidal Thoughts:Homicidal Thoughts: Yes, Passive HI Passive Intent and/or Plan: Without Intent; Without Plan   Sensorium  Memory:Immediate Good; Recent Good; Remote Good  Judgment:Intact  Insight:Lacking   Executive Functions  Concentration:Good  Attention Span:Good  [place]  Activity  Psychomotor Activity:Psychomotor Activity: Restlessness   Assets  Assets:Communication Skills; Desire for  Improvement; Financial Resources/Insurance; Housing; Physical Health   Sleep  Sleep:Sleep: Fair   Physical Exam Constitutional:      General: She is not in acute distress.    Appearance: She is not ill-appearing, toxic-appearing or diaphoretic.  HENT:     Head: Normocephalic.     Right Ear: External ear normal.     Left Ear: External ear normal.  Eyes:     Conjunctiva/sclera: Conjunctivae normal.     Pupils: Pupils are equal, round, and reactive to light.  Cardiovascular:     Rate and Rhythm: Normal rate.  Pulmonary:     Effort: Pulmonary effort is normal. No respiratory distress.  Musculoskeletal:        General: Normal range of motion.  Skin:    General: Skin is warm and dry.  Neurological:     Mental Status: She is alert and oriented to person, place, and time.  Psychiatric:        Mood and Affect: Mood is anxious and depressed.        Speech: Speech is rapid and pressured.        Thought Content: Thought content is not paranoid or delusional. Thought content includes suicidal ideation. Thought content does not include homicidal ideation. Thought content includes suicidal plan.    Review of Systems  Constitutional: Negative for chills, diaphoresis, fever, malaise/fatigue and weight loss.  HENT: Negative for congestion.   Respiratory: Negative for cough and shortness of breath.   Cardiovascular: Negative for chest pain and palpitations.  Gastrointestinal: Negative for diarrhea, nausea and vomiting.  Neurological: Negative for dizziness and seizures.  Psychiatric/Behavioral: Positive for depression and suicidal ideas. Negative for hallucinations, memory loss and substance abuse. The patient is nervous/anxious and has insomnia.   All other systems reviewed and are negative.   Blood pressure 118/83, pulse 79, temperature 97.7 F (36.5 C), temperature source Temporal, resp. rate 18, height 5\' 4"  (1.626 m), weight 96.2 kg, SpO2 100 %. Body mass index is 36.39 kg/m.  Past  Psychiatric History: Borderline Personality Disorder, MDD, PTSD, multiple inpatient admissions  Is the patient at risk to self? Yes  Has the patient been a risk to self in the past 6 months? Yes .    Has the patient been a risk to self within the distant past? Yes   Is the patient a risk to others? No   Has the patient been a risk to others in the past 6 months? No   Has the patient been a risk to others within the distant past? No   Past Medical History:  Past Medical History:  Diagnosis Date  . Acid reflux   . Anxiety   . Asthma    last attack 03/13/15 or 03/14/15  . Autism   . 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 Chronic constipation   . Depression   . Drug-seeking behavior   . Essential tremor   . Headache   . Overdose of acetaminophen 07/2017   and other meds  . Personality disorder ([place])   . Schizo-affective psychosis ([place])   . Schizoaffective disorder, bipolar type ([place])   . Seizures [facility])    Last seizure December 2017  . Sleep apnea     Past Surgical History:  Procedure Laterality Date  . MOUTH SURGERY  2009 or 2010    Family History:  Family History  Problem Relation Age of Onset  .  Mental illness Father   . Asthma Father   . PDD Brother   . Seizures Brother     Social History:  Social History   Socioeconomic History  . Marital status: Widowed    Spouse name: Not on file  . Number of children: 0  . Years of education: Not on file  . Highest education level: Not on file  Occupational History  . Occupation: disability  Tobacco Use  . Smoking status: Former Smoker    Packs/day: 0.00  . Smokeless tobacco: Never Used  . Tobacco comment: Smoked for 2  years age [age]  Vaping Use  . Vaping Use: Never used  Substance and Sexual Activity  . Alcohol use: No    Alcohol/week: 1.0 standard drink    Types: 1 Standard drinks or equivalent per week    Comment: denies at this time  . Drug use: No    Comment: History of cocaine use at age 63 for 4 months  .  Sexual activity: Yes    Birth control/protection: None  Other Topics Concern  . Not on file  Social History Narrative   Marital status: Widowed      Children: daughter      Lives: with boyfriend, in two story home      Employment:  Disability      Tobacco: quit smoking; smoked for two years.      Alcohol ;none      Drugs: none   Has not traveled outside of the country.   Right handed         Social Determinants of Health   Financial Resource Strain: Not on file  Food Insecurity: Not on file  Transportation Needs: Not on file  Physical Activity: Not on file  Stress: Not on file  Social Connections: Not on file  Intimate Partner Violence: Not on file    SDOH:  SDOH Screenings   Alcohol Screen: Low Risk   . Last Alcohol Screening Score (AUDIT): 0  Depression (PHQ2-9): Medium Risk  . PHQ-2 Score: 27  Financial Resource Strain: Not on file  Food Insecurity: Not on file  Housing: Not on file  Physical Activity: Not on file  Social Connections: Not on file  Stress: Not on file  Tobacco Use: Medium Risk  . Smoking Tobacco Use: Former Smoker  . Smokeless Tobacco Use: Never Used  Transportation Needs: Not on file    Last Labs:  Admission on 01/30/2020, Discharged on 01/30/2020  Component Date Value Ref Range Status  . Sodium 01/30/2020 140  135 - 145 mmol/L Final  . Potassium 01/30/2020 3.8  3.5 - 5.1 mmol/L Final  . Chloride 01/30/2020 108  98 - 111 mmol/L Final  . CO2 01/30/2020 22  22 - 32 mmol/L Final  . Glucose, Bld 01/30/2020 100* 70 - 99 mg/dL Final   Glucose reference range applies only to samples taken after fasting for at least 8 hours.  . BUN 01/30/2020 13  6 - 20 mg/dL Final  . Creatinine, Ser 01/30/2020 1.09* 0.44 - 1.00 mg/dL Final  . Calcium 01/30/2020 9.1  8.9 - 10.3 mg/dL Final  . Total Protein 01/30/2020 7.5  6.5 - 8.1 g/dL Final  . Albumin 01/30/2020 4.3  3.5 - 5.0 g/dL Final  . AST 01/30/2020 16  15 - 41 U/L Final  . ALT 01/30/2020 16  0 - 44 U/L  Final  . Alkaline Phosphatase 01/30/2020 64  38 - 126 U/L Final  . Total Bilirubin 01/30/2020 0.6  0.3 - 1.2 mg/dL Final  . GFR, Estimated 01/30/2020 >60  >60 mL/min Final   Comment: (NOTE) Calculated using the CKD-EPI Creatinine Equation (2021)   . Anion gap 01/30/2020 10  5 - 15 Final   Performed at [facility], [place] [address], [place], [place] [address]  . Alcohol, Ethyl (B) 01/30/2020 <10  <10 mg/dL Final   Comment: (NOTE) Lowest detectable limit for serum alcohol is 10 mg/dL.  For medical purposes only. Performed at [facility], [place] [address], [place], [place] [address]   . Salicylate Lvl 0000000 <7.0* 7.0 - 30.0 mg/dL Final   Performed at [place] [address], [place], [place] [address]  . Acetaminophen (Tylenol), Serum 01/30/2020 <10* 10 - 30 ug/mL Final   Comment: (NOTE) Therapeutic concentrations vary significantly. A range of 10-30 ug/mL  may be an effective concentration for many patients. However, some  are best treated at concentrations outside of this range. Acetaminophen concentrations >150 ug/mL at 4 hours after ingestion  and >50 ug/mL at 12 hours after ingestion are often associated with  toxic reactions.  Performed at [facility], [place] [address] [place], [place] [address]   . WBC 01/30/2020 8.1  4.0 - 10.5 K/uL Final  . RBC 01/30/2020 4.80  3.87 - 5.11 MIL/uL Final  . Hemoglobin 01/30/2020 12.5  12.0 - 15.0 g/dL Final  . HCT 01/30/2020 39.8  36.0 - 46.0 % Final  . MCV 01/30/2020 82.9  80.0 - 100.0 fL Final  . MCH 01/30/2020 26.0  26.0 - 34.0 pg Final  . MCHC 01/30/2020 31.4  30.0 - 36.0 g/dL Final  . RDW 01/30/2020 13.5  11.5 - 15.5 % Final  . Platelets 01/30/2020 263  150 - 400 K/uL Final  . nRBC 01/30/2020 0.0  0.0 - 0.2 % Final   Performed at [facility], [place] [address] [place], [place] [address]  . Opiates 01/30/2020 NONE DETECTED  NONE  DETECTED Final  . Cocaine 01/30/2020 NONE DETECTED  NONE DETECTED Final  . Benzodiazepines 01/30/2020 NONE DETECTED  NONE DETECTED Final  . Amphetamines 01/30/2020 NONE DETECTED  NONE DETECTED Final  . Tetrahydrocannabinol 01/30/2020 NONE DETECTED  NONE DETECTED Final  . Barbiturates 01/30/2020 NONE DETECTED  NONE DETECTED Final   Comment: (NOTE) DRUG SCREEN FOR MEDICAL PURPOSES ONLY.  IF CONFIRMATION IS NEEDED FOR ANY PURPOSE, NOTIFY LAB WITHIN 5 DAYS.  LOWEST DETECTABLE LIMITS FOR URINE DRUG SCREEN Drug Class                     Cutoff (ng/mL) Amphetamine and metabolites    1000 Barbiturate and metabolites    200 Benzodiazepine                 A999333 Tricyclics and metabolites     300 Opiates and metabolites        300 Cocaine and metabolites        300 THC                            50 Performed at [facility], [place] [address] [place], [place] [address]   . I-stat hCG, quantitative 01/30/2020 <5.0  <5 mIU/mL Final  . Comment 3 01/30/2020          Final   Comment:   GEST. AGE      CONC.  (mIU/mL)   <=1 WEEK  5 - 50     2 WEEKS       50 - 500     3 WEEKS       100 - 10,000     4 WEEKS     1,000 - 30,000        FEMALE AND NON-PREGNANT FEMALE:     LESS THAN 5 mIU/mL   Admission on 01/25/2020, Discharged on 01/26/2020  Component Date Value Ref Range Status  . Sodium 01/25/2020 136  135 - 145 mmol/L Final  . Potassium 01/25/2020 3.9  3.5 - 5.1 mmol/L Final  . Chloride 01/25/2020 102  98 - 111 mmol/L Final  . CO2 01/25/2020 24  22 - 32 mmol/L Final  . Glucose, Bld 01/25/2020 94  70 - 99 mg/dL Final   Glucose reference range applies only to samples taken after fasting for at least 8 hours.  . BUN 01/25/2020 8  6 - 20 mg/dL Final  . Creatinine, Ser 01/25/2020 0.74  0.44 - 1.00 mg/dL Final  . Calcium 01/25/2020 9.4  8.9 - 10.3 mg/dL Final  . Total Protein 01/25/2020 7.4  6.5 - 8.1 g/dL Final  . Albumin 01/25/2020 4.2  3.5 - 5.0 g/dL Final  . AST  01/25/2020 19  15 - 41 U/L Final  . ALT 01/25/2020 19  0 - 44 U/L Final  . Alkaline Phosphatase 01/25/2020 73  38 - 126 U/L Final  . Total Bilirubin 01/25/2020 0.7  0.3 - 1.2 mg/dL Final  . GFR, Estimated 01/25/2020 >60  >60 mL/min Final   Comment: (NOTE) Calculated using the CKD-EPI Creatinine Equation (2021)   . Anion gap 01/25/2020 10  5 - 15 Final   Performed at [facility], [place] [address], [place], [place] [address]  . Alcohol, Ethyl (B) 01/25/2020 <10  <10 mg/dL Final   Comment: (NOTE) Lowest detectable limit for serum alcohol is 10 mg/dL.  For medical purposes only. Performed at [facility], [place] [address] [place], [place] [address]   . Salicylate Lvl [id number] <7.0* 7.0 - 30.0 mg/dL Final   Performed at [place] [address] [place], [place] [address]  . Acetaminophen (Tylenol), Serum 01/25/2020 <10* 10 - 30 ug/mL Final   Comment: (NOTE) Therapeutic concentrations vary significantly. A range of 10-30 ug/mL  may be an effective concentration for many patients. However, some  are best treated at concentrations outside of this range. Acetaminophen concentrations >150 ug/mL at 4 hours after ingestion  and >50 ug/mL at 12 hours after ingestion are often associated with  toxic reactions.  Performed at [facility], [place] [address], [place], [place] [address]   . WBC 01/25/2020 8.2  4.0 - 10.5 K/uL Final  . RBC 01/25/2020 5.07  3.87 - 5.11 MIL/uL Final  . Hemoglobin 01/25/2020 13.1  12.0 - 15.0 g/dL Final  . HCT 01/25/2020 42.2  36.0 - 46.0 % Final  . MCV 01/25/2020 83.2  80.0 - 100.0 fL Final  . MCH 01/25/2020 25.8* 26.0 - 34.0 pg Final  . MCHC 01/25/2020 31.0  30.0 - 36.0 g/dL Final  . RDW 01/25/2020 13.5  11.5 - 15.5 % Final  . Platelets 01/25/2020 258  150 - 400 K/uL Final  . nRBC 01/25/2020 0.0  0.0 - 0.2 % Final   Performed at [facility], [place] [address] [place], [place] [address]  . Opiates 01/25/2020 NONE DETECTED  NONE  DETECTED Final  . Cocaine 01/25/2020 NONE  DETECTED  NONE DETECTED Final  . Benzodiazepines 01/25/2020 NONE DETECTED  NONE DETECTED Final  . Amphetamines 01/25/2020 NONE DETECTED  NONE DETECTED Final  . Tetrahydrocannabinol 01/25/2020 NONE DETECTED  NONE DETECTED Final  . Barbiturates 01/25/2020 NONE DETECTED  NONE DETECTED Final   Comment: (NOTE) DRUG SCREEN FOR MEDICAL PURPOSES ONLY.  IF CONFIRMATION IS NEEDED FOR ANY PURPOSE, NOTIFY LAB WITHIN 5 DAYS.  LOWEST DETECTABLE LIMITS FOR URINE DRUG SCREEN Drug Class                     Cutoff (ng/mL) Amphetamine and metabolites    1000 Barbiturate and metabolites    200 Benzodiazepine                 A999333 Tricyclics and metabolites     300 Opiates and metabolites        300 Cocaine and metabolites        300 THC                            50 Performed at [facility], [place] [address], [place], [place] [address]   . I-stat hCG, quantitative 01/25/2020 <5.0  <5 mIU/mL Final  . Comment 3 01/25/2020          Final   Comment:   GEST. AGE      CONC.  (mIU/mL)   <=1 WEEK        5 - 50     2 WEEKS       50 - 500     3 WEEKS       100 - 10,000     4 WEEKS     1,000 - 30,000        FEMALE AND NON-PREGNANT FEMALE:     LESS THAN 5 mIU/mL   . SARS Coronavirus 2 by RT PCR 01/26/2020 NEGATIVE  NEGATIVE Final   Comment: (NOTE) SARS-CoV-2 target nucleic acids are NOT DETECTED.  The SARS-CoV-2 RNA is generally detectable in upper respiratory specimens during the acute phase of infection. The lowest concentration of SARS-CoV-2 viral copies this assay can detect is 138 copies/mL. A negative result does not preclude SARS-Cov-2 infection and should not be used as the sole basis for treatment or other patient management decisions. A negative result may occur with  improper specimen collection/handling, submission of specimen other than nasopharyngeal swab, presence of viral mutation(s) within the areas targeted by this assay, and inadequate  number of viral copies(<138 copies/mL). A negative result must be combined with clinical observations, patient history, and epidemiological information. The expected result is Negative.  Fact Sheet for Patients:  EntrepreneurPulse.com.au  Fact Sheet for Healthcare Providers:  IncredibleEmployment.be  This test is no                          t yet approved or cleared by the [place] FDA and  has been authorized for detection and/or diagnosis of SARS-CoV-2 by FDA under an Emergency Use Authorization (EUA). This EUA will remain  in effect (meaning this test can be used) for the duration of the COVID-19 declaration under Section 564(b)(1) of the Act, 21 U.S.C.section 360bbb-3(b)(1), unless the authorization is terminated  or revoked sooner.      . Influenza A by PCR 01/26/2020 NEGATIVE  NEGATIVE Final  . Influenza B by PCR 01/26/2020 NEGATIVE  NEGATIVE Final   Comment: (NOTE) The  Xpert Xpress SARS-CoV-2/FLU/RSV plus assay is intended as an aid in the diagnosis of influenza from Nasopharyngeal swab specimens and should not be used as a sole basis for treatment. Nasal washings and aspirates are unacceptable for Xpert Xpress SARS-CoV-2/FLU/RSV testing.  Fact Sheet for Patients: EntrepreneurPulse.com.au  Fact Sheet for Healthcare Providers: IncredibleEmployment.be  This test is not yet approved or cleared by the [place] FDA and has been authorized for detection and/or diagnosis of SARS-CoV-2 by FDA under an Emergency Use Authorization (EUA). This EUA will remain in effect (meaning this test can be used) for the duration of the COVID-19 declaration under Section 564(b)(1) of the Act, 21 U.S.C. section 360bbb-3(b)(1), unless the authorization is terminated or revoked.  Performed at [facility], [place] [address], [place], [place] [address]   Admission on 01/23/2020, Discharged on 01/24/2020   Component Date Value Ref Range Status  . SARS Coronavirus 2 Ag 01/23/2020 Negative  Negative Preliminary  . POC Amphetamine UR 01/23/2020 None Detected  NONE DETECTED (Cut Off Level 1000 ng/mL) Final  . POC Secobarbital (BAR) 01/23/2020 None Detected  NONE DETECTED (Cut Off Level 300 ng/mL) Final  . POC Buprenorphine (BUP) 01/23/2020 None Detected  NONE DETECTED (Cut Off Level 10 ng/mL) Final  . POC Oxazepam (BZO) 01/23/2020 None Detected  NONE DETECTED (Cut Off Level 300 ng/mL) Final  . POC Cocaine UR 01/23/2020 None Detected  NONE DETECTED (Cut Off Level 300 ng/mL) Final  . POC Methamphetamine UR 01/23/2020 None Detected  NONE DETECTED (Cut Off Level 1000 ng/mL) Final  . POC Morphine 01/23/2020 None Detected  NONE DETECTED (Cut Off Level 300 ng/mL) Final  . POC Oxycodone UR 01/23/2020 None Detected  NONE DETECTED (Cut Off Level 100 ng/mL) Final  . POC Methadone UR 01/23/2020 None Detected  NONE DETECTED (Cut Off Level 300 ng/mL) Final  . POC Marijuana UR 01/23/2020 None Detected  NONE DETECTED (Cut Off Level 50 ng/mL) Final  Admission on 01/23/2020, Discharged on 01/23/2020  Component Date Value Ref Range Status  . Sodium 01/23/2020 139  135 - 145 mmol/L Final  . Potassium 01/23/2020 3.7  3.5 - 5.1 mmol/L Final  . Chloride 01/23/2020 106  98 - 111 mmol/L Final  . CO2 01/23/2020 22  22 - 32 mmol/L Final  . Glucose, Bld 01/23/2020 100* 70 - 99 mg/dL Final   Glucose reference range applies only to samples taken after fasting for at least 8 hours.  . BUN 01/23/2020 11  6 - 20 mg/dL Final  . Creatinine, Ser 01/23/2020 0.77  0.44 - 1.00 mg/dL Final  . Calcium 01/23/2020 9.0  8.9 - 10.3 mg/dL Final  . Total Protein 01/23/2020 6.7  6.5 - 8.1 g/dL Final  . Albumin 01/23/2020 3.9  3.5 - 5.0 g/dL Final  . AST 01/23/2020 17  15 - 41 U/L Final  . ALT 01/23/2020 17  0 - 44 U/L Final  . Alkaline Phosphatase 01/23/2020 58  38 - 126 U/L Final  . Total Bilirubin 01/23/2020 0.4  0.3 - 1.2 mg/dL Final   . GFR, Estimated 01/23/2020 >60  >60 mL/min Final   Comment: (NOTE) Calculated using the CKD-EPI Creatinine Equation (2021)   . Anion gap 01/23/2020 11  5 - 15 Final   Performed at [facility], [place] [address] [place], [place] [address]  . Alcohol, Ethyl (B) 01/23/2020 <10  <10 mg/dL Final   Comment: (NOTE) Lowest detectable limit for serum alcohol is 10 mg/dL.  For medical purposes only. Performed  at [facility], [place] [address] [place], [place] [address]   . Salicylate Lvl 123456 <7.0* 7.0 - 30.0 mg/dL Final   Performed at [place] [address] [place], [place] [address]  . Acetaminophen (Tylenol), Serum 01/23/2020 <10* 10 - 30 ug/mL Final   Comment: (NOTE) Therapeutic concentrations vary significantly. A range of 10-30 ug/mL  may be an effective concentration for many patients. However, some  are best treated at concentrations outside of this range. Acetaminophen concentrations >150 ug/mL at 4 hours after ingestion  and >50 ug/mL at 12 hours after ingestion are often associated with  toxic reactions.  Performed at [facility], [place] [address] [place], [place] [address]   . WBC 01/23/2020 7.2  4.0 - 10.5 K/uL Final  . RBC 01/23/2020 4.57  3.87 - 5.11 MIL/uL Final  . Hemoglobin 01/23/2020 12.1  12.0 - 15.0 g/dL Final  . HCT 01/23/2020 38.1  36.0 - 46.0 % Final  . MCV 01/23/2020 83.4  80.0 - 100.0 fL Final  . MCH 01/23/2020 26.5  26.0 - 34.0 pg Final  . MCHC 01/23/2020 31.8  30.0 - 36.0 g/dL Final  . RDW 01/23/2020 13.3  11.5 - 15.5 % Final  . Platelets 01/23/2020 251  150 - 400 K/uL Final  . nRBC 01/23/2020 0.0  0.0 - 0.2 % Final   Performed at [facility], [place] [address] [place], [place] [address]  . I-stat hCG, quantitative 01/23/2020 <5.0  <5 mIU/mL Final  . Comment 3 01/23/2020          Final   Comment:   GEST. AGE      CONC.  (mIU/mL)   <=1 WEEK        5 -  50     2 WEEKS       50 - 500     3 WEEKS       100 - 10,000     4 WEEKS     1,000 - 30,000        FEMALE AND NON-PREGNANT FEMALE:     LESS THAN 5 mIU/mL   . SARS Coronavirus 2 by RT PCR 01/23/2020 NEGATIVE  NEGATIVE Final   Comment: (NOTE) SARS-CoV-2 target nucleic acids are NOT DETECTED.  The SARS-CoV-2 RNA is generally detectable in upper respiratory specimens during the acute phase of infection. The lowest concentration of SARS-CoV-2 viral copies this assay can detect is 138 copies/mL. A negative result does not preclude SARS-Cov-2 infection and should not be used as the sole basis for treatment or other patient management decisions. A negative result may occur with  improper specimen collection/handling, submission of specimen other than nasopharyngeal swab, presence of viral mutation(s) within the areas targeted by this assay, and inadequate number of viral copies(<138 copies/mL). A negative result must be combined with clinical observations, patient history, and epidemiological information. The expected result is Negative.  Fact Sheet for Patients:  EntrepreneurPulse.com.au  Fact Sheet for Healthcare Providers:  IncredibleEmployment.be  This test is no                          t yet approved or cleared by the [place] FDA and  has been authorized for detection and/or diagnosis of SARS-CoV-2 by FDA under an Emergency Use Authorization (EUA). This EUA will remain  in effect (meaning this test can be used) for the duration of the COVID-19 declaration under Section 564(b)(1) of  the Act, 21 U.S.C.section 360bbb-3(b)(1), unless the authorization is terminated  or revoked sooner.      . Influenza A by PCR 01/23/2020 NEGATIVE  NEGATIVE Final  . Influenza B by PCR 01/23/2020 NEGATIVE  NEGATIVE Final   Comment: (NOTE) The Xpert Xpress SARS-CoV-2/FLU/RSV plus assay is intended as an aid in the diagnosis of influenza from Nasopharyngeal  swab specimens and should not be used as a sole basis for treatment. Nasal washings and aspirates are unacceptable for Xpert Xpress SARS-CoV-2/FLU/RSV testing.  Fact Sheet for Patients: BloggerCourse.com  Fact Sheet for Healthcare Providers: SeriousBroker.it  This test is not yet approved or cleared by the [place] FDA and has been authorized for detection and/or diagnosis of SARS-CoV-2 by FDA under an Emergency Use Authorization (EUA). This EUA will remain in effect (meaning this test can be used) for the duration of the COVID-19 declaration under Section 564(b)(1) of the Act, 21 U.S.C. section 360bbb-3(b)(1), unless the authorization is terminated or revoked.  Performed at [facility], [address], [place], [place] [address]   Admission on 01/17/2020, Discharged on 01/22/2020  Component Date Value Ref Range Status  . Cholesterol 01/18/2020 173  0 - 200 mg/dL Final  . Triglycerides 01/18/2020 159* <150 mg/dL Final  . HDL 25/36/6440 49  >40 mg/dL Final  . Total CHOL/HDL Ratio 01/18/2020 3.5  RATIO Final  . VLDL 01/18/2020 32  0 - 40 mg/dL Final  . LDL Cholesterol 01/18/2020 92  0 - 99 mg/dL Final   Comment:        Total Cholesterol/HDL:CHD Risk Coronary Heart Disease Risk Table                     Men   Women  1/2 Average Risk   3.4   3.3  Average Risk       5.0   4.4  2 X Average Risk   9.6   7.1  3 X Average Risk  23.4   11.0        Use the calculated Patient Ratio above and the CHD Risk Table to determine the patient's CHD Risk.        ATP III CLASSIFICATION (LDL):  <100     mg/dL   Optimal  347-425  mg/dL   Near or Above                    Optimal  130-159  mg/dL   Borderline  956-387  mg/dL   High  >564     mg/dL   Very High Performed at [facility], [address], [place], [place] [address]   . Free T4 01/18/2020 0.97  0.61 - 1.12 ng/dL Final   Comment:  (NOTE) Biotin ingestion may interfere with free T4 tests. If the results are inconsistent with the TSH level, previous test results, or the clinical presentation, then consider biotin interference. If needed, order repeat testing after stopping biotin. Performed at [facility], [address], [place], [place] [address]   . TSH 01/18/2020 2.470  0.350 - 4.500 uIU/mL Final   Comment: Performed by a 3rd Generation assay with a functional sensitivity of <=0.01 uIU/mL. Performed at [facility], [address], [place], [place] [address]   . Prolactin 01/18/2020 128.0* 4.8 - 23.3 ng/mL Final   Comment: (NOTE) Performed At: [facility] [address] [place], [place] [id number] Jolene Schimke MD [id number]   . Prolactin 01/19/2020 129.0*  4.8 - 23.3 ng/mL Final   Comment: (NOTE) Performed At: [facility] [place], [place] [id number] Rush Farmer MD [id number]   Admission on 01/15/2020, Discharged on 01/17/2020  Component Date Value Ref Range Status  . Color, Urine 01/15/2020 COLORLESS* YELLOW Final  . APPearance 01/15/2020 CLEAR  CLEAR Final  . Specific Gravity, Urine 01/15/2020 1.001* 1.005 - 1.030 Final  . pH 01/15/2020 6.0  5.0 - 8.0 Final  . Glucose, UA 01/15/2020 NEGATIVE  NEGATIVE mg/dL Final  . Hgb urine dipstick 01/15/2020 SMALL* NEGATIVE Final  . Bilirubin Urine 01/15/2020 NEGATIVE  NEGATIVE Final  . Ketones, ur 01/15/2020 NEGATIVE  NEGATIVE mg/dL Final  . Protein, ur 01/15/2020 NEGATIVE  NEGATIVE mg/dL Final  . Nitrite 01/15/2020 NEGATIVE  NEGATIVE Final  . Chalmers Guest 01/15/2020 NEGATIVE  NEGATIVE Final  . Bacteria, UA 01/15/2020 NONE SEEN  NONE SEEN Final   Performed at [place] [address] [place], [place] [address]  . WBC 01/15/2020 7.4  4.0 - 10.5 K/uL Final  . RBC 01/15/2020 4.96  3.87 - 5.11 MIL/uL Final  . Hemoglobin 01/15/2020 13.1  12.0 - 15.0 g/dL Final  . HCT  01/15/2020 41.3  36.0 - 46.0 % Final  . MCV 01/15/2020 83.3  80.0 - 100.0 fL Final  . MCH 01/15/2020 26.4  26.0 - 34.0 pg Final  . MCHC 01/15/2020 31.7  30.0 - 36.0 g/dL Final  . RDW 01/15/2020 13.7  11.5 - 15.5 % Final  . Platelets 01/15/2020 230  150 - 400 K/uL Final  . nRBC 01/15/2020 0.0  0.0 - 0.2 % Final  . Neutrophils Relative % 01/15/2020 61  % Final  . Neutro Abs 01/15/2020 4.4  1.7 - 7.7 K/uL Final  . Lymphocytes Relative 01/15/2020 34  % Final  . Lymphs Abs 01/15/2020 2.5  0.7 - 4.0 K/uL Final  . Monocytes Relative 01/15/2020 5  % Final  . Monocytes Absolute 01/15/2020 0.4  0.1 - 1.0 K/uL Final  . Eosinophils Relative 01/15/2020 0  % Final  . Eosinophils Absolute 01/15/2020 0.0  0.0 - 0.5 K/uL Final  . Basophils Relative 01/15/2020 0  % Final  . Basophils Absolute 01/15/2020 0.0  0.0 - 0.1 K/uL Final  . Immature Granulocytes 01/15/2020 0  % Final  . Abs Immature Granulocytes 01/15/2020 0.02  0.00 - 0.07 K/uL Final   Performed at [facility], [place] [address], [place], [place] [address]  . Sodium 01/15/2020 140  135 - 145 mmol/L Final  . Potassium 01/15/2020 3.6  3.5 - 5.1 mmol/L Final  . Chloride 01/15/2020 107  98 - 111 mmol/L Final  . CO2 01/15/2020 21* 22 - 32 mmol/L Final  . Glucose, Bld 01/15/2020 101* 70 - 99 mg/dL Final   Glucose reference range applies only to samples taken after fasting for at least 8 hours.  . BUN 01/15/2020 7  6 - 20 mg/dL Final  . Creatinine, Ser 01/15/2020 0.78  0.44 - 1.00 mg/dL Final  . Calcium 01/15/2020 9.6  8.9 - 10.3 mg/dL Final  . Total Protein 01/15/2020 7.9  6.5 - 8.1 g/dL Final  . Albumin 01/15/2020 4.6  3.5 - 5.0 g/dL Final  . AST 01/15/2020 20  15 - 41 U/L Final  . ALT 01/15/2020 20  0 - 44 U/L Final  . Alkaline Phosphatase 01/15/2020 66  38 - 126 U/L Final  . Total Bilirubin 01/15/2020 0.6  0.3 - 1.2 mg/dL Final  . GFR, Estimated 01/15/2020 >60  >  60 mL/min Final   Comment: (NOTE) Calculated using the CKD-EPI  Creatinine Equation (2021)   . Anion gap 01/15/2020 12  5 - 15 Final   Performed at [facility], [place] [address] [place], [place] [address]  . Alcohol, Ethyl (B) 01/15/2020 <10  <10 mg/dL Final   Comment: (NOTE) Lowest detectable limit for serum alcohol is 10 mg/dL.  For medical purposes only. Performed at [facility], [place] [address], [place], [place] [address]   . Salicylate Lvl [id number] <7.0* 7.0 - 30.0 mg/dL Final   Performed at [place] [address] [place], [place] [address]  . Acetaminophen (Tylenol), Serum 01/15/2020 <10* 10 - 30 ug/mL Final   Comment: (NOTE) Therapeutic concentrations vary significantly. A range of 10-30 ug/mL  may be an effective concentration for many patients. However, some  are best treated at concentrations outside of this range. Acetaminophen concentrations >150 ug/mL at 4 hours after ingestion  and >50 ug/mL at 12 hours after ingestion are often associated with  toxic reactions.  Performed at [facility], [place] [address] [place], [place] [address]   . Opiates 01/15/2020 NONE DETECTED  NONE DETECTED Final  . Cocaine 01/15/2020 NONE DETECTED  NONE DETECTED Final  . Benzodiazepines 01/15/2020 NONE DETECTED  NONE DETECTED Final  . Amphetamines 01/15/2020 NONE DETECTED  NONE DETECTED Final  . Tetrahydrocannabinol 01/15/2020 NONE DETECTED  NONE DETECTED Final  . Barbiturates 01/15/2020 NONE DETECTED  NONE DETECTED Final   Comment: (NOTE) DRUG SCREEN FOR MEDICAL PURPOSES ONLY.  IF CONFIRMATION IS NEEDED FOR ANY PURPOSE, NOTIFY LAB WITHIN 5 DAYS.  LOWEST DETECTABLE LIMITS FOR URINE DRUG SCREEN Drug Class                     Cutoff (ng/mL) Amphetamine and metabolites    1000 Barbiturate and metabolites    200 Benzodiazepine                 [id number] Tricyclics and metabolites     300 Opiates and metabolites        300 Cocaine and metabolites        300 THC                             50 Performed at [facility], [place] [address] [place], [place] [address]   . Magnesium 01/16/2020 1.9  1.7 - 2.4 mg/dL Final   Performed at [place] [address] [place], [place] [address]  . SARS Coronavirus 2 by RT PCR 01/16/2020 NEGATIVE  NEGATIVE Final   Comment: (NOTE) SARS-CoV-2 target nucleic acids are NOT DETECTED.  The SARS-CoV-2 RNA is generally detectable in upper respiratory specimens during the acute phase of infection. The lowest concentration of SARS-CoV-2 viral copies this assay can detect is 138 copies/mL. A negative result does not preclude SARS-Cov-2 infection and should not be used as the sole basis for treatment or other patient management decisions. A negative result may occur with  improper specimen collection/handling, submission of specimen other than nasopharyngeal swab, presence of viral mutation(s) within the areas targeted by this assay, and inadequate number of viral copies(<138 copies/mL). A negative result must be combined with clinical observations, patient history, and epidemiological information. The expected result is Negative.  Fact Sheet for Patients:  EntrepreneurPulse.com.au  Fact Sheet for Healthcare Providers:  IncredibleEmployment.be  This test is no  t yet approved or cleared by the [place] and  has been authorized for detection and/or diagnosis of SARS-CoV-2 by FDA under an Emergency Use Authorization (EUA). This EUA will remain  in effect (meaning this test can be used) for the duration of the COVID-19 declaration under Section 564(b)(1) of the Act, 21 U.S.C.section 360bbb-3(b)(1), unless the authorization is terminated  or revoked sooner.      . Influenza A by PCR 01/16/2020 NEGATIVE  NEGATIVE Final  . Influenza B by PCR 01/16/2020 NEGATIVE  NEGATIVE Final   Comment: (NOTE) The Xpert Xpress  SARS-CoV-2/FLU/RSV plus assay is intended as an aid in the diagnosis of influenza from Nasopharyngeal swab specimens and should not be used as a sole basis for treatment. Nasal washings and aspirates are unacceptable for Xpert Xpress SARS-CoV-2/FLU/RSV testing.  Fact Sheet for Patients: EntrepreneurPulse.com.au  Fact Sheet for Healthcare Providers: IncredibleEmployment.be  This test is not yet approved or cleared by the [place] FDA and has been authorized for detection and/or diagnosis of SARS-CoV-2 by FDA under an Emergency Use Authorization (EUA). This EUA will remain in effect (meaning this test can be used) for the duration of the COVID-19 declaration under Section 564(b)(1) of the Act, 21 U.S.C. section 360bbb-3(b)(1), unless the authorization is terminated or revoked.  Performed at [facility], [place] [address] [place], [place] [address]   . I-stat hCG, quantitative 01/16/2020 <5.0  <5 mIU/mL Final  . Comment 3 01/16/2020          Final   Comment:   GEST. AGE      CONC.  (mIU/mL)   <=1 WEEK        5 - 50     2 WEEKS       50 - 500     3 WEEKS       100 - 10,000     4 WEEKS     1,000 - 30,000        FEMALE AND NON-PREGNANT FEMALE:     LESS THAN 5 mIU/mL   . Sodium 01/16/2020 141  135 - 145 mmol/L Final  . Potassium 01/16/2020 3.8  3.5 - 5.1 mmol/L Final  . Chloride 01/16/2020 112* 98 - 111 mmol/L Final  . CO2 01/16/2020 17* 22 - 32 mmol/L Final  . Glucose, Bld 01/16/2020 114* 70 - 99 mg/dL Final   Glucose reference range applies only to samples taken after fasting for at least 8 hours.  . BUN 01/16/2020 7  6 - 20 mg/dL Final  . Creatinine, Ser 01/16/2020 0.84  0.44 - 1.00 mg/dL Final  . Calcium 01/16/2020 8.1* 8.9 - 10.3 mg/dL Final  . GFR, Estimated 01/16/2020 >60  >60 mL/min Final   Comment: (NOTE) Calculated using the CKD-EPI Creatinine Equation (2021)   . Anion gap 01/16/2020 12  5 - 15 Final    Performed at [facility], [place] [address], [place], [place] [address]  . Magnesium 01/16/2020 1.9  1.7 - 2.4 mg/dL Final   Performed at [place] [address] [place], [place] [address]  Admission on 01/13/2020, Discharged on 01/14/2020  Component Date Value Ref Range Status  . SARS Coronavirus 2 by RT PCR 01/14/2020 NEGATIVE  NEGATIVE Final   Comment: (NOTE) SARS-CoV-2 target nucleic acids are NOT DETECTED.  The SARS-CoV-2 RNA is generally detectable in upper respiratory specimens during the acute phase of infection. The lowest concentration of SARS-CoV-2 viral copies this assay can detect is 138  copies/mL. A negative result does not preclude SARS-Cov-2 infection and should not be used as the sole basis for treatment or other patient management decisions. A negative result may occur with  improper specimen collection/handling, submission of specimen other than nasopharyngeal swab, presence of viral mutation(s) within the areas targeted by this assay, and inadequate number of viral copies(<138 copies/mL). A negative result must be combined with clinical observations, patient history, and epidemiological information. The expected result is Negative.  Fact Sheet for Patients:  EntrepreneurPulse.com.au  Fact Sheet for Healthcare Providers:  IncredibleEmployment.be  This test is no                          t yet approved or cleared by the [place] FDA and  has been authorized for detection and/or diagnosis of SARS-CoV-2 by FDA under an Emergency Use Authorization (EUA). This EUA will remain  in effect (meaning this test can be used) for the duration of the COVID-19 declaration under Section 564(b)(1) of the Act, 21 U.S.C.section 360bbb-3(b)(1), unless the authorization is terminated  or revoked sooner.      . Influenza A by PCR 01/14/2020 NEGATIVE  NEGATIVE Final  . Influenza B by PCR 01/14/2020  NEGATIVE  NEGATIVE Final   Comment: (NOTE) The Xpert Xpress SARS-CoV-2/FLU/RSV plus assay is intended as an aid in the diagnosis of influenza from Nasopharyngeal swab specimens and should not be used as a sole basis for treatment. Nasal washings and aspirates are unacceptable for Xpert Xpress SARS-CoV-2/FLU/RSV testing.  Fact Sheet for Patients: EntrepreneurPulse.com.au  Fact Sheet for Healthcare Providers: IncredibleEmployment.be  This test is not yet approved or cleared by the [place] FDA and has been authorized for detection and/or diagnosis of SARS-CoV-2 by FDA under an Emergency Use Authorization (EUA). This EUA will remain in effect (meaning this test can be used) for the duration of the COVID-19 declaration under Section 564(b)(1) of the Act, 21 U.S.C. section 360bbb-3(b)(1), unless the authorization is terminated or revoked.  Performed at [facility], [place] [address], [place], [place] [address]   . SARS Coronavirus 2 Ag 01/14/2020 Negative  Negative Preliminary  . POC Amphetamine UR 01/14/2020 None Detected  None Detected Preliminary  . POC Secobarbital (BAR) 01/14/2020 None Detected  None Detected Preliminary  . POC Buprenorphine (BUP) 01/14/2020 None Detected  None Detected Preliminary  . POC Oxazepam (BZO) 01/14/2020 None Detected  None Detected Preliminary  . POC Cocaine UR 01/14/2020 None Detected  None Detected Preliminary  . POC Methamphetamine UR 01/14/2020 None Detected  None Detected Preliminary  . POC Morphine 01/14/2020 None Detected  None Detected Preliminary  . POC Oxycodone UR 01/14/2020 None Detected  None Detected Preliminary  . POC Methadone UR 01/14/2020 None Detected  None Detected Preliminary  . POC Marijuana UR 01/14/2020 None Detected  None Detected Preliminary  . Preg Test, Ur 01/14/2020 NEGATIVE  NEGATIVE Final   Comment:        THE SENSITIVITY OF THIS METHODOLOGY IS >24 mIU/mL   Admission on  01/13/2020, Discharged on 01/13/2020  Component Date Value Ref Range Status  . Lipase 01/13/2020 38  11 - 51 U/L Final   Performed at [facility], [place] [address], [place],  [place] [address]  . Sodium 01/13/2020 139  135 - 145 mmol/L Final  . Potassium 01/13/2020 3.7  3.5 - 5.1 mmol/L Final  . Chloride 01/13/2020 107  98 - 111 mmol/L Final  . CO2 01/13/2020 22  22 -  32 mmol/L Final  . Glucose, Bld 01/13/2020 94  70 - 99 mg/dL Final   Glucose reference range applies only to samples taken after fasting for at least 8 hours.  . BUN 01/13/2020 11  6 - 20 mg/dL Final  . Creatinine, Ser 01/13/2020 0.70  0.44 - 1.00 mg/dL Final  . Calcium 01/13/2020 9.4  8.9 - 10.3 mg/dL Final  . Total Protein 01/13/2020 7.0  6.5 - 8.1 g/dL Final  . Albumin 01/13/2020 4.2  3.5 - 5.0 g/dL Final  . AST 01/13/2020 21  15 - 41 U/L Final  . ALT 01/13/2020 17  0 - 44 U/L Final  . Alkaline Phosphatase 01/13/2020 65  38 - 126 U/L Final  . Total Bilirubin 01/13/2020 0.3  0.3 - 1.2 mg/dL Final  . GFR, Estimated 01/13/2020 >60  >60 mL/min Final   Comment: (NOTE) Calculated using the CKD-EPI Creatinine Equation (2021)   . Anion gap 01/13/2020 10  5 - 15 Final   Performed at [facility], [place] [address] [place], [place] [address]  . WBC 01/13/2020 7.6  4.0 - 10.5 K/uL Final  . RBC 01/13/2020 4.77  3.87 - 5.11 MIL/uL Final  . Hemoglobin 01/13/2020 12.7  12.0 - 15.0 g/dL Final  . HCT 01/13/2020 39.2  36.0 - 46.0 % Final  . MCV 01/13/2020 82.2  80.0 - 100.0 fL Final  . MCH 01/13/2020 26.6  26.0 - 34.0 pg Final  . MCHC 01/13/2020 32.4  30.0 - 36.0 g/dL Final  . RDW 01/13/2020 13.6  11.5 - 15.5 % Final  . Platelets 01/13/2020 271  150 - 400 K/uL Final  . nRBC 01/13/2020 0.0  0.0 - 0.2 % Final   Performed at [facility], [place] [address] [place], [place] [address]  . I-stat hCG, quantitative 01/13/2020 <5.0  <5 mIU/mL Final  . Comment 3 01/13/2020          Final   Comment:   GEST. AGE      CONC.   (mIU/mL)   <=1 WEEK        5 - 50     2 WEEKS       50 - 500     3 WEEKS       100 - 10,000     4 WEEKS     1,000 - 30,000        FEMALE AND NON-PREGNANT FEMALE:     LESS THAN 5 mIU/mL   Admission on 01/08/2020, Discharged on 01/08/2020  Component Date Value Ref Range Status  . Lipase 01/08/2020 42  11 - 51 U/L Final   Performed at [facility], [place] [address], [place], [place] [address]  . Sodium 01/08/2020 137  135 - 145 mmol/L Final  . Potassium 01/08/2020 4.0  3.5 - 5.1 mmol/L Final  . Chloride 01/08/2020 108  98 - 111 mmol/L Final  . CO2 01/08/2020 20* 22 - 32 mmol/L Final  . Glucose, Bld 01/08/2020 103* 70 - 99 mg/dL Final   Glucose reference range applies only to samples taken after fasting for at least 8 hours.  . BUN 01/08/2020 7  6 - 20 mg/dL Final  . Creatinine, Ser 01/08/2020 0.72  0.44 - 1.00 mg/dL Final  . Calcium 01/08/2020 9.2  8.9 - 10.3 mg/dL Final  . Total Protein 01/08/2020 8.0  6.5 - 8.1 g/dL Final  . Albumin 01/08/2020 4.6  3.5 - 5.0 g/dL Final  . AST 01/08/2020 22  15 - 41 U/L Final  . ALT 01/08/2020 20  0 - 44 U/L Final  . Alkaline Phosphatase 01/08/2020 67  38 - 126 U/L Final  . Total Bilirubin 01/08/2020 0.3  0.3 - 1.2 mg/dL Final  . GFR, Estimated 01/08/2020 >60  >60 mL/min Final   Comment: (NOTE) Calculated using the CKD-EPI Creatinine Equation (2021)   . Anion gap 01/08/2020 9  5 - 15 Final   Performed at [facility], [place] [address], [place], [place] [address]  . WBC 01/08/2020 7.0  4.0 - 10.5 K/uL Final  . RBC 01/08/2020 5.01  3.87 - 5.11 MIL/uL Final  . Hemoglobin 01/08/2020 13.3  12.0 - 15.0 g/dL Final  . HCT 01/08/2020 41.6  36.0 - 46.0 % Final  . MCV 01/08/2020 83.0  80.0 - 100.0 fL Final  . MCH 01/08/2020 26.5  26.0 - 34.0 pg Final  . MCHC 01/08/2020 32.0  30.0 - 36.0 g/dL Final  . RDW 01/08/2020 13.8  11.5 - 15.5 % Final  . Platelets 01/08/2020 268  150 - 400 K/uL Final  . nRBC 01/08/2020 0.0  0.0 -  0.2 % Final   Performed at [facility], [place] [address] [place], [place] [address]  . Color, Urine 01/08/2020 YELLOW  YELLOW Final  . APPearance 01/08/2020 CLEAR  CLEAR Final  . Specific Gravity, Urine 01/08/2020 1.020  1.005 - 1.030 Final  . pH 01/08/2020 5.0  5.0 - 8.0 Final  . Glucose, UA 01/08/2020 NEGATIVE  NEGATIVE mg/dL Final  . Hgb urine dipstick 01/08/2020 SMALL* NEGATIVE Final  . Bilirubin Urine 01/08/2020 NEGATIVE  NEGATIVE Final  . Ketones, ur 01/08/2020 NEGATIVE  NEGATIVE mg/dL Final  . Protein, ur 01/08/2020 NEGATIVE  NEGATIVE mg/dL Final  . Nitrite 01/08/2020 NEGATIVE  NEGATIVE Final  . Chalmers Guest 01/08/2020 NEGATIVE  NEGATIVE Final  . RBC / HPF 01/08/2020 0-5  0 - 5 RBC/hpf Final  . WBC, UA 01/08/2020 0-5  0 - 5 WBC/hpf Final  . Bacteria, UA 01/08/2020 RARE* NONE SEEN Final   Performed at [facility], [place] [address], [place],  [address]  . Preg, Serum 01/08/2020 NEGATIVE  NEGATIVE Final   Comment:        THE SENSITIVITY OF THIS METHODOLOGY IS >10 mIU/mL. Performed at [facility], [place] [address] [place],  [address]   . Glucose-Capillary 01/08/2020 97  70 - 99 mg/dL Final   Glucose reference range applies only to samples taken after fasting for at least 8 hours.  Admission on 01/04/2020, Discharged on 01/04/2020  Component Date Value Ref Range Status  . I-stat hCG, quantitative 01/04/2020 <5.0  <5 mIU/mL Final  . Comment 3 01/04/2020          Final   Comment:   GEST. AGE      CONC.  (mIU/mL)   <=1 WEEK        5 - 50     2 WEEKS       50 - 500     3 WEEKS       100 - 10,000     4 WEEKS     1,000 - 30,000        FEMALE AND NON-PREGNANT FEMALE:     LESS THAN 5 mIU/mL   There may be more visits with results that are not included.    Allergies: Bee venom, Coconut flavor, Geodon [ziprasidone hcl], Haloperidol and related, Lithium, Oxycodone, Quetiapine, Seroquel [quetiapine fumarate],  Shellfish allergy, Phenergan [promethazine hcl], Prilosec [omeprazole], Sulfa antibiotics, Tegretol [carbamazepine], Prozac [fluoxetine], and Tape  PTA Medications: (Not in a hospital admission)   Medical Decision Making  Admission labs ordered  Continue home medications -Risperdal 3 mg PO QHS for mood stability -Zoloft 150 mg PO daily for depression -Trazodone 200 mg QHS prn for sleep -Protonix 40 mg daily for GERD -hydroxyzine 25 mg PO TID PRN for anxiety -Zofran 8 mg PO q8h for nausea/vomiting -Dulera INH 200-5 MCG 2 puffs BID for asthma -albuterol INH 1-2 puffs every 4 hours prn for SOB/wheeezing  Clinical Course as of 02/03/20 0500  Wed Feb 03, 2020  0500 POCT Urine Drug Screen - (ICup) UDS negative [JB]    Clinical Course User Index [JB] Rozetta Nunnery, NP    Recommendations  Based on my evaluation the patient does not appear to have an emergency medical condition.  Rozetta Nunnery, NP 02/03/20  2:45 AM

## 2020-02-03 NOTE — ED Notes (Signed)
Meal given

## 2020-02-03 NOTE — ED Notes (Signed)
Pt laying in bed with her eyes open staring off. Pt is quiet calm and cooperative pt has no c/o of pain or distress. breathing even and unlabored. Will continue to monitor for safety

## 2020-02-03 NOTE — ED Provider Notes (Signed)
Behavioral Health Progress Note  Date and Time: 02/03/2020 3:22 PM Name: [name] MRN:  [id number]  Subjective: Patient is a 29 year old female that presented to the [facility] earlier today endorsing worsening depression and suicidal ideations.  Patient was reevaluated and continues to endorse suicidal ideation stating they have worsened.  Reported the patient has not been compliant with her medications on a daily basis and this may have some impact on it.  Patient also reports that her boyfriend decided to go back to [place] today but then states that that is not the reason she is here.  Patient continues report that she does not like being alone and feels that she needs to be admitted to the hospital to get stabilized again.  Patient will continue her current home medications patient needs inpatient psychiatric treatment.  Patient feels that she needs to go to a different facility to receive different treatment for stabilization.  Diagnosis:  Final diagnoses:  Severe recurrent major depression without psychotic features ([place])  Borderline personality disorder ([place])    Total Time spent with patient: 20 minutes  Past Psychiatric History: Borderline Personality Disorder, MDD, PTSD, multiple inpatient admissions Past Medical History:  Past Medical History:  Diagnosis Date  . Acid reflux   . Anxiety   . Asthma    last attack 03/13/15 or 03/14/15  . Autism   . 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 Chronic constipation   . Depression   . Drug-seeking behavior   . Essential tremor   . Headache   . Overdose of acetaminophen 07/2017   and other meds  . Personality disorder ([place])   . Schizo-affective psychosis ([place])   . Schizoaffective disorder, bipolar type ([place])   . Seizures [facility])    Last seizure December 2017  . Sleep apnea     Past Surgical History:  Procedure Laterality Date  . MOUTH SURGERY  2009 or 2010   Family History:  Family History  Problem Relation Age of Onset   . Mental illness Father   . Asthma Father   . PDD Brother   . Seizures Brother    Family Psychiatric  History: See above Social History:  Social History   Substance and Sexual Activity  Alcohol Use No  . Alcohol/week: 1.0 standard drink  . Types: 1 Standard drinks or equivalent per week   Comment: denies at this time     Social History   Substance and Sexual Activity  Drug Use No   Comment: History of cocaine use at age 72 for 4 months    Social History   Socioeconomic History  . Marital status: Widowed    Spouse name: Not on file  . Number of children: 0  . Years of education: Not on file  . Highest education level: Not on file  Occupational History  . Occupation: disability  Tobacco Use  . Smoking status: Former Smoker    Packs/day: 0.00  . Smokeless tobacco: Never Used  . Tobacco comment: Smoked for 2  years age [age]  Vaping Use  . Vaping Use: Never used  Substance and Sexual Activity  . Alcohol use: No    Alcohol/week: 1.0 standard drink    Types: 1 Standard drinks or equivalent per week    Comment: denies at this time  . Drug use: No    Comment: History of cocaine use at age 61 for 4 months  . Sexual activity: Yes    Birth control/protection: None  Other  Topics Concern  . Not on file  Social History Narrative   Marital status: Widowed      Children: daughter      Lives: with boyfriend, in two story home      Employment:  Disability      Tobacco: quit smoking; smoked for two years.      Alcohol ;none      Drugs: none   Has not traveled outside of the country.   Right handed         Social Determinants of Health   Financial Resource Strain: Not on file  Food Insecurity: Not on file  Transportation Needs: Not on file  Physical Activity: Not on file  Stress: Not on file  Social Connections: Not on file   SDOH:  SDOH Screenings   Alcohol Screen: Low Risk   . Last Alcohol Screening Score (AUDIT): 0  Depression (PHQ2-9): Medium Risk  .  PHQ-2 Score: 27  Financial Resource Strain: Not on file  Food Insecurity: Not on file  Housing: Not on file  Physical Activity: Not on file  Social Connections: Not on file  Stress: Not on file  Tobacco Use: Medium Risk  . Smoking Tobacco Use: Former Smoker  . Smokeless Tobacco Use: Never Used  Transportation Needs: Not on file   Additional Social History:                         Sleep: Good  Appetite:  Good  Current Medications:  Current Facility-Administered Medications  Medication Dose Route Frequency Provider Last Rate Last Admin  . acetaminophen (TYLENOL) tablet 650 mg  650 mg Oral Q6H PRN Nira Conn A, NP      . albuterol (VENTOLIN HFA) 108 (90 Base) MCG/ACT inhaler 1-2 puff  1-2 puff Inhalation Q6H PRN Nira Conn A, NP      . alum & mag hydroxide-simeth (MAALOX/MYLANTA) 200-200-20 MG/5ML suspension 30 mL  30 mL Oral Q4H PRN Nira Conn A, NP      . benzocaine (ORAJEL) 10 % mucosal gel   Mouth/Throat QID PRN Nira Conn A, NP      . hydrOXYzine (ATARAX/VISTARIL) tablet 25 mg  25 mg Oral TID PRN Nira Conn A, NP   25 mg at 02/03/20 0329  . ibuprofen (ADVIL) tablet 400 mg  400 mg Oral Q6H PRN Nira Conn A, NP      . magnesium hydroxide (MILK OF MAGNESIA) suspension 30 mL  30 mL Oral Daily PRN Nira Conn A, NP      . mometasone-formoterol (DULERA) 200-5 MCG/ACT inhaler 2 puff  2 puff Inhalation BID Nira Conn A, NP   2 puff at 02/03/20 0944  . ondansetron (ZOFRAN) tablet 4 mg  4 mg Oral Q8H PRN Nira Conn A, NP      . pantoprazole (PROTONIX) EC tablet 40 mg  40 mg Oral Daily Nira Conn A, NP   40 mg at 02/03/20 0944  . risperiDONE (RISPERDAL) tablet 3 mg  3 mg Oral QHS Nira Conn A, NP   3 mg at 02/03/20 0303  . sertraline (ZOLOFT) tablet 150 mg  150 mg Oral Daily Nira Conn A, NP   150 mg at 02/03/20 0944  . SUMAtriptan (IMITREX) tablet 50 mg  50 mg Oral BID PRN Nira Conn A, NP   50 mg at 02/03/20 0947  . traZODone (DESYREL) tablet 200 mg  200  mg Oral QHS Nira Conn A, NP   200 mg at  02/03/20 0303   Current Outpatient Medications  Medication Sig Dispense Refill  . albuterol (PROVENTIL HFA;VENTOLIN HFA) 108 (90 Base) MCG/ACT inhaler Inhale 1-2 puffs into the lungs every 6 (six) hours as needed for wheezing or shortness of breath.    . fluticasone-salmeterol (ADVAIR HFA) 115-21 MCG/ACT inhaler Inhale 2 puffs into the lungs 2 (two) times daily. (Patient taking differently: Inhale 2 puffs into the lungs 2 (two) times daily as needed (shortness of breath).) 1 Inhaler 5  . hydrOXYzine (ATARAX/VISTARIL) 25 MG tablet Take 1 tablet (25 mg total) by mouth 3 (three) times daily as needed for anxiety. (Patient taking differently: Take 25 mg by mouth 2 (two) times daily as needed for anxiety.) 75 tablet 0  . ibuprofen (ADVIL) 200 MG tablet Take 400 mg by mouth every 6 (six) hours as needed for cramping.    . ondansetron (ZOFRAN) 4 MG tablet Take 1 tablet (4 mg total) by mouth every 8 (eight) hours as needed for nausea or vomiting. 12 tablet 0  . pantoprazole (PROTONIX) 40 MG tablet Take 1 tablet (40 mg total) by mouth daily. 30 tablet 0  . risperiDONE (RISPERDAL) 1 MG tablet Take 1 mg by mouth daily.    . risperiDONE (RISPERDAL) 3 MG tablet Take 1 tablet (3 mg total) by mouth 2 (two) times daily. For mood control (Patient taking differently: Take 3 mg by mouth at bedtime. For mood control) 60 tablet 0  . sertraline (ZOLOFT) 50 MG tablet Take 150 mg by mouth daily. 3 tablets daily = 150mg     . SUMAtriptan (IMITREX) 50 MG tablet Take 50 mg by mouth 2 (two) times daily as needed for migraine.    . traZODone (DESYREL) 100 MG tablet Take 2 tablets (200 mg total) by mouth at bedtime. For sleep 30 tablet 0    Labs  Lab Results:  Admission on 02/03/2020  Component Date Value Ref Range Status  . SARS Coronavirus 2 by RT PCR 02/03/2020 NEGATIVE  NEGATIVE Final   Comment: (NOTE) SARS-CoV-2 target nucleic acids are NOT DETECTED.  The SARS-CoV-2 RNA is  generally detectable in upper respiratory specimens during the acute phase of infection. The lowest concentration of SARS-CoV-2 viral copies this assay can detect is 138 copies/mL. A negative result does not preclude SARS-Cov-2 infection and should not be used as the sole basis for treatment or other patient management decisions. A negative result may occur with  improper specimen collection/handling, submission of specimen other than nasopharyngeal swab, presence of viral mutation(s) within the areas targeted by this assay, and inadequate number of viral copies(<138 copies/mL). A negative result must be combined with clinical observations, patient history, and epidemiological information. The expected result is Negative.  Fact Sheet for Patients:  EntrepreneurPulse.com.au  Fact Sheet for Healthcare Providers:  IncredibleEmployment.be  This test is no                          t yet approved or cleared by the [place] FDA and  has been authorized for detection and/or diagnosis of SARS-CoV-2 by FDA under an Emergency Use Authorization (EUA). This EUA will remain  in effect (meaning this test can be used) for the duration of the COVID-19 declaration under Section 564(b)(1) of the Act, 21 U.S.C.section 360bbb-3(b)(1), unless the authorization is terminated  or revoked sooner.      . Influenza A by PCR 02/03/2020 NEGATIVE  NEGATIVE Final  . Influenza B by PCR 02/03/2020 NEGATIVE  NEGATIVE Final  Comment: (NOTE) The Xpert Xpress SARS-CoV-2/FLU/RSV plus assay is intended as an aid in the diagnosis of influenza from Nasopharyngeal swab specimens and should not be used as a sole basis for treatment. Nasal washings and aspirates are unacceptable for Xpert Xpress SARS-CoV-2/FLU/RSV testing.  Fact Sheet for Patients: EntrepreneurPulse.com.au  Fact Sheet for Healthcare Providers: IncredibleEmployment.be  This  test is not yet approved or cleared by the [place] FDA and has been authorized for detection and/or diagnosis of SARS-CoV-2 by FDA under an Emergency Use Authorization (EUA). This EUA will remain in effect (meaning this test can be used) for the duration of the COVID-19 declaration under Section 564(b)(1) of the Act, 21 U.S.C. section 360bbb-3(b)(1), unless the authorization is terminated or revoked.  Performed at [facility], [place] [address] [place], [place] [address]   . WBC 02/03/2020 7.9  4.0 - 10.5 K/uL Final  . RBC 02/03/2020 5.09  3.87 - 5.11 MIL/uL Final  . Hemoglobin 02/03/2020 13.4  12.0 - 15.0 g/dL Final  . HCT 02/03/2020 41.3  36.0 - 46.0 % Final  . MCV 02/03/2020 81.1  80.0 - 100.0 fL Final  . MCH 02/03/2020 26.3  26.0 - 34.0 pg Final  . MCHC 02/03/2020 32.4  30.0 - 36.0 g/dL Final  . RDW 02/03/2020 13.2  11.5 - 15.5 % Final  . Platelets 02/03/2020 304  150 - 400 K/uL Final  . nRBC 02/03/2020 0.0  0.0 - 0.2 % Final  . Neutrophils Relative % 02/03/2020 59  % Final  . Neutro Abs 02/03/2020 4.7  1.7 - 7.7 K/uL Final  . Lymphocytes Relative 02/03/2020 34  % Final  . Lymphs Abs 02/03/2020 2.7  0.7 - 4.0 K/uL Final  . Monocytes Relative 02/03/2020 5  % Final  . Monocytes Absolute 02/03/2020 0.4  0.1 - 1.0 K/uL Final  . Eosinophils Relative 02/03/2020 1  % Final  . Eosinophils Absolute 02/03/2020 0.1  0.0 - 0.5 K/uL Final  . Basophils Relative 02/03/2020 1  % Final  . Basophils Absolute 02/03/2020 0.0  0.0 - 0.1 K/uL Final  . Immature Granulocytes 02/03/2020 0  % Final  . Abs Immature Granulocytes 02/03/2020 0.03  0.00 - 0.07 K/uL Final   Performed at [facility], [place] [address], [place], [place] [address]  . Sodium 02/03/2020 138  135 - 145 mmol/L Final  . Potassium 02/03/2020 3.4* 3.5 - 5.1 mmol/L Final  . Chloride 02/03/2020 102  98 - 111 mmol/L Final  . CO2 02/03/2020 22  22 - 32 mmol/L Final  . Glucose, Bld 02/03/2020 86  70 - 99 mg/dL Final    Glucose reference range applies only to samples taken after fasting for at least 8 hours.  . BUN 02/03/2020 9  6 - 20 mg/dL Final  . Creatinine, Ser 02/03/2020 0.69  0.44 - 1.00 mg/dL Final  . Calcium 02/03/2020 9.7  8.9 - 10.3 mg/dL Final  . Total Protein 02/03/2020 7.8  6.5 - 8.1 g/dL Final  . Albumin 02/03/2020 4.4  3.5 - 5.0 g/dL Final  . AST 02/03/2020 16  15 - 41 U/L Final  . ALT 02/03/2020 17  0 - 44 U/L Final  . Alkaline Phosphatase 02/03/2020 73  38 - 126 U/L Final  . Total Bilirubin 02/03/2020 0.4  0.3 - 1.2 mg/dL Final  . GFR, Estimated 02/03/2020 >60  >60 mL/min Final   Comment: (NOTE) Calculated using the CKD-EPI Creatinine Equation (2021)   . Anion gap 02/03/2020 14  5 -  15 Final   Performed at [facility], [place] [address], [place], [place] [address]  . POC Amphetamine UR 02/03/2020 None Detected  NONE DETECTED (Cut Off Level 1000 ng/mL) Preliminary  . POC Secobarbital (BAR) 02/03/2020 None Detected  NONE DETECTED (Cut Off Level 300 ng/mL) Preliminary  . POC Buprenorphine (BUP) 02/03/2020 None Detected  NONE DETECTED (Cut Off Level 10 ng/mL) Preliminary  . POC Oxazepam (BZO) 02/03/2020 None Detected  NONE DETECTED (Cut Off Level 300 ng/mL) Preliminary  . POC Cocaine UR 02/03/2020 None Detected  NONE DETECTED (Cut Off Level 300 ng/mL) Preliminary  . POC Methamphetamine UR 02/03/2020 None Detected  NONE DETECTED (Cut Off Level 1000 ng/mL) Preliminary  . POC Morphine 02/03/2020 None Detected  NONE DETECTED (Cut Off Level 300 ng/mL) Preliminary  . POC Oxycodone UR 02/03/2020 None Detected  NONE DETECTED (Cut Off Level 100 ng/mL) Preliminary  . POC Methadone UR 02/03/2020 None Detected  NONE DETECTED (Cut Off Level 300 ng/mL) Preliminary  . POC Marijuana UR 02/03/2020 None Detected  NONE DETECTED (Cut Off Level 50 ng/mL) Preliminary  . Preg Test, Ur 02/03/2020 NEGATIVE  NEGATIVE Final   Comment:        THE SENSITIVITY OF THIS METHODOLOGY IS >24 mIU/mL   Admission on  01/30/2020, Discharged on 01/30/2020  Component Date Value Ref Range Status  . Sodium 01/30/2020 140  135 - 145 mmol/L Final  . Potassium 01/30/2020 3.8  3.5 - 5.1 mmol/L Final  . Chloride 01/30/2020 108  98 - 111 mmol/L Final  . CO2 01/30/2020 22  22 - 32 mmol/L Final  . Glucose, Bld 01/30/2020 100* 70 - 99 mg/dL Final   Glucose reference range applies only to samples taken after fasting for at least 8 hours.  . BUN 01/30/2020 13  6 - 20 mg/dL Final  . Creatinine, Ser 01/30/2020 1.09* 0.44 - 1.00 mg/dL Final  . Calcium 01/30/2020 9.1  8.9 - 10.3 mg/dL Final  . Total Protein 01/30/2020 7.5  6.5 - 8.1 g/dL Final  . Albumin 01/30/2020 4.3  3.5 - 5.0 g/dL Final  . AST 01/30/2020 16  15 - 41 U/L Final  . ALT 01/30/2020 16  0 - 44 U/L Final  . Alkaline Phosphatase 01/30/2020 64  38 - 126 U/L Final  . Total Bilirubin 01/30/2020 0.6  0.3 - 1.2 mg/dL Final  . GFR, Estimated 01/30/2020 >60  >60 mL/min Final   Comment: (NOTE) Calculated using the CKD-EPI Creatinine Equation (2021)   . Anion gap 01/30/2020 10  5 - 15 Final   Performed at [facility], [place] [address], [place], [place] [address]  . Alcohol, Ethyl (B) 01/30/2020 <10  <10 mg/dL Final   Comment: (NOTE) Lowest detectable limit for serum alcohol is 10 mg/dL.  For medical purposes only. Performed at [facility], [place] [address] [place], [place] [address]   . Salicylate Lvl 0000000 <7.0* 7.0 - 30.0 mg/dL Final   Performed at [place] [address], [place], [place] [address]  . Acetaminophen (Tylenol), Serum 01/30/2020 <10* 10 - 30 ug/mL Final   Comment: (NOTE) Therapeutic concentrations vary significantly. A range of 10-30 ug/mL  may be an effective concentration for many patients. However, some  are best treated at concentrations outside of this range. Acetaminophen concentrations >150 ug/mL at 4 hours after ingestion  and >50 ug/mL at 12 hours after ingestion  are often associated with  toxic reactions.  Performed at [facility],  [place] [address], [place], [place] [address]   . WBC 01/30/2020 8.1  4.0 - 10.5 K/uL Final  . RBC 01/30/2020 4.80  3.87 - 5.11 MIL/uL Final  . Hemoglobin 01/30/2020 12.5  12.0 - 15.0 g/dL Final  . HCT 01/30/2020 39.8  36.0 - 46.0 % Final  . MCV 01/30/2020 82.9  80.0 - 100.0 fL Final  . MCH 01/30/2020 26.0  26.0 - 34.0 pg Final  . MCHC 01/30/2020 31.4  30.0 - 36.0 g/dL Final  . RDW 01/30/2020 13.5  11.5 - 15.5 % Final  . Platelets 01/30/2020 263  150 - 400 K/uL Final  . nRBC 01/30/2020 0.0  0.0 - 0.2 % Final   Performed at [facility], [place] [address] [place], [place] [address]  . Opiates 01/30/2020 NONE DETECTED  NONE DETECTED Final  . Cocaine 01/30/2020 NONE DETECTED  NONE DETECTED Final  . Benzodiazepines 01/30/2020 NONE DETECTED  NONE DETECTED Final  . Amphetamines 01/30/2020 NONE DETECTED  NONE DETECTED Final  . Tetrahydrocannabinol 01/30/2020 NONE DETECTED  NONE DETECTED Final  . Barbiturates 01/30/2020 NONE DETECTED  NONE DETECTED Final   Comment: (NOTE) DRUG SCREEN FOR MEDICAL PURPOSES ONLY.  IF CONFIRMATION IS NEEDED FOR ANY PURPOSE, NOTIFY LAB WITHIN 5 DAYS.  LOWEST DETECTABLE LIMITS FOR URINE DRUG SCREEN Drug Class                     Cutoff (ng/mL) Amphetamine and metabolites    1000 Barbiturate and metabolites    200 Benzodiazepine                 A999333 Tricyclics and metabolites     300 Opiates and metabolites        300 Cocaine and metabolites        300 THC                            50 Performed at [facility], [place] [address], [place], [place] [address]   . I-stat hCG, quantitative 01/30/2020 <5.0  <5 mIU/mL Final  . Comment 3 01/30/2020          Final   Comment:   GEST. AGE      CONC.  (mIU/mL)   <=1 WEEK        5 - 50     2 WEEKS       50 - 500     3 WEEKS       100 - 10,000     4 WEEKS     1,000 - 30,000        FEMALE AND  NON-PREGNANT FEMALE:     LESS THAN 5 mIU/mL   Admission on 01/25/2020, Discharged on 01/26/2020  Component Date Value Ref Range Status  . Sodium 01/25/2020 136  135 - 145 mmol/L Final  . Potassium 01/25/2020 3.9  3.5 - 5.1 mmol/L Final  . Chloride 01/25/2020 102  98 - 111 mmol/L Final  . CO2 01/25/2020 24  22 - 32 mmol/L Final  . Glucose, Bld 01/25/2020 94  70 - 99 mg/dL Final   Glucose reference range applies only to samples taken after fasting for at least 8 hours.  . BUN 01/25/2020 8  6 - 20 mg/dL Final  . Creatinine, Ser 01/25/2020 0.74  0.44 - 1.00 mg/dL Final  . Calcium 01/25/2020 9.4  8.9 - 10.3 mg/dL Final  . Total Protein 01/25/2020 7.4  6.5 - 8.1 g/dL Final  . Albumin 01/25/2020 4.2  3.5 - 5.0 g/dL Final  . AST 01/25/2020 19  15 - 41 U/L Final  . ALT 01/25/2020 19  0 - 44 U/L Final  . Alkaline Phosphatase 01/25/2020 73  38 - 126 U/L Final  . Total Bilirubin 01/25/2020 0.7  0.3 - 1.2 mg/dL Final  . GFR, Estimated 01/25/2020 >60  >60 mL/min Final   Comment: (NOTE) Calculated using the CKD-EPI Creatinine Equation (2021)   . Anion gap 01/25/2020 10  5 - 15 Final   Performed at [facility], [place] [address] [place], [place] [address]  . Alcohol, Ethyl (B) 01/25/2020 <10  <10 mg/dL Final   Comment: (NOTE) Lowest detectable limit for serum alcohol is 10 mg/dL.  For medical purposes only. Performed at [facility], [place] [address], [place], [place] [address]   . Salicylate Lvl [id number] <7.0* 7.0 - 30.0 mg/dL Final   Performed at [place] [address] [place], [place] [address]  . Acetaminophen (Tylenol), Serum 01/25/2020 <10* 10 - 30 ug/mL Final   Comment: (NOTE) Therapeutic concentrations vary significantly. A range of 10-30 ug/mL  may be an effective concentration for many patients. However, some  are best treated at concentrations outside of this range. Acetaminophen concentrations >150 ug/mL at 4 hours after ingestion  and >50 ug/mL at 12  hours after ingestion are often associated with  toxic reactions.  Performed at [facility], [place] [address] [place], [place] [address]   . WBC 01/25/2020 8.2  4.0 - 10.5 K/uL Final  . RBC 01/25/2020 5.07  3.87 - 5.11 MIL/uL Final  . Hemoglobin 01/25/2020 13.1  12.0 - 15.0 g/dL Final  . HCT 01/25/2020 42.2  36.0 - 46.0 % Final  . MCV 01/25/2020 83.2  80.0 - 100.0 fL Final  . MCH 01/25/2020 25.8* 26.0 - 34.0 pg Final  . MCHC 01/25/2020 31.0  30.0 - 36.0 g/dL Final  . RDW 01/25/2020 13.5  11.5 - 15.5 % Final  . Platelets 01/25/2020 258  150 - 400 K/uL Final  . nRBC 01/25/2020 0.0  0.0 - 0.2 % Final   Performed at [facility], [place] [address] [place], [place] [address]  . Opiates 01/25/2020 NONE DETECTED  NONE DETECTED Final  . Cocaine 01/25/2020 NONE DETECTED  NONE DETECTED Final  . Benzodiazepines 01/25/2020 NONE DETECTED  NONE DETECTED Final  . Amphetamines 01/25/2020 NONE DETECTED  NONE DETECTED Final  . Tetrahydrocannabinol 01/25/2020 NONE DETECTED  NONE DETECTED Final  . Barbiturates 01/25/2020 NONE DETECTED  NONE DETECTED Final   Comment: (NOTE) DRUG SCREEN FOR MEDICAL PURPOSES ONLY.  IF CONFIRMATION IS NEEDED FOR ANY PURPOSE, NOTIFY LAB WITHIN 5 DAYS.  LOWEST DETECTABLE LIMITS FOR URINE DRUG SCREEN Drug Class                     Cutoff (ng/mL) Amphetamine and metabolites    1000 Barbiturate and metabolites    200 Benzodiazepine                 A999333 Tricyclics and metabolites     300 Opiates and metabolites        300 Cocaine and metabolites        300 THC                            50 Performed at [facility], [place] [address], [place], [place] [address]   . I-stat hCG, quantitative 01/25/2020 <5.0  <5 mIU/mL Final  . Comment 3 01/25/2020          Final   Comment:   GEST. AGE      CONC.  (mIU/mL)   <=1 WEEK        5 - 50     2 WEEKS       50 - 500     3 WEEKS       100 - 10,000     4 WEEKS     1,000 - 30,000        FEMALE AND NON-PREGNANT  FEMALE:     LESS THAN 5 mIU/mL   . SARS Coronavirus 2 by RT PCR 01/26/2020 NEGATIVE  NEGATIVE Final   Comment: (NOTE) SARS-CoV-2 target nucleic acids are NOT DETECTED.  The SARS-CoV-2 RNA is generally detectable in upper respiratory specimens during the acute phase of infection. The lowest concentration of SARS-CoV-2 viral copies this assay can detect is 138 copies/mL. A negative result does not preclude SARS-Cov-2 infection and should not be used as the sole basis for treatment or other patient management decisions. A negative result may occur with  improper specimen collection/handling, submission of specimen other than nasopharyngeal swab, presence of viral mutation(s) within the areas targeted by this assay, and inadequate number of viral copies(<138 copies/mL). A negative result must be combined with clinical observations, patient history, and epidemiological information. The expected result is Negative.  Fact Sheet for Patients:  EntrepreneurPulse.com.au  Fact Sheet for Healthcare Providers:  IncredibleEmployment.be  This test is no                          t yet approved or cleared by the [place] FDA and  has been authorized for detection and/or diagnosis of SARS-CoV-2 by FDA under an Emergency Use Authorization (EUA). This EUA will remain  in effect (meaning this test can be used) for the duration of the COVID-19 declaration under Section 564(b)(1) of the Act, 21 U.S.C.section 360bbb-3(b)(1), unless the authorization is terminated  or revoked sooner.      . Influenza A by PCR 01/26/2020 NEGATIVE  NEGATIVE Final  . Influenza B by PCR 01/26/2020 NEGATIVE  NEGATIVE Final   Comment: (NOTE) The Xpert Xpress SARS-CoV-2/FLU/RSV plus assay is intended as an aid in the diagnosis of influenza from Nasopharyngeal swab specimens and should not be used as a sole basis for treatment. Nasal washings and aspirates are unacceptable for Xpert  Xpress SARS-CoV-2/FLU/RSV testing.  Fact Sheet for Patients: EntrepreneurPulse.com.au  Fact Sheet for Healthcare Providers: IncredibleEmployment.be  This test is not yet approved or cleared by the [place] FDA and has been authorized for detection and/or diagnosis of SARS-CoV-2 by FDA under an Emergency Use Authorization (EUA). This EUA will remain in effect (meaning this test can be used) for the duration of the COVID-19 declaration under Section 564(b)(1) of the Act, 21 U.S.C. section 360bbb-3(b)(1), unless the authorization is terminated or revoked.  Performed at [facility], [place] [address], [place], [place] [address]   Admission on 01/23/2020, Discharged on 01/24/2020  Component Date Value Ref Range Status  . SARS Coronavirus 2 Ag 01/23/2020 Negative  Negative Preliminary  . POC Amphetamine UR 01/23/2020 None Detected  NONE DETECTED (Cut Off Level 1000 ng/mL) Final  . POC Secobarbital (BAR) 01/23/2020 None Detected  NONE DETECTED (Cut Off Level 300  ng/mL) Final  . POC Buprenorphine (BUP) 01/23/2020 None Detected  NONE DETECTED (Cut Off Level 10 ng/mL) Final  . POC Oxazepam (BZO) 01/23/2020 None Detected  NONE DETECTED (Cut Off Level 300 ng/mL) Final  . POC Cocaine UR 01/23/2020 None Detected  NONE DETECTED (Cut Off Level 300 ng/mL) Final  . POC Methamphetamine UR 01/23/2020 None Detected  NONE DETECTED (Cut Off Level 1000 ng/mL) Final  . POC Morphine 01/23/2020 None Detected  NONE DETECTED (Cut Off Level 300 ng/mL) Final  . POC Oxycodone UR 01/23/2020 None Detected  NONE DETECTED (Cut Off Level 100 ng/mL) Final  . POC Methadone UR 01/23/2020 None Detected  NONE DETECTED (Cut Off Level 300 ng/mL) Final  . POC Marijuana UR 01/23/2020 None Detected  NONE DETECTED (Cut Off Level 50 ng/mL) Final  Admission on 01/23/2020, Discharged on 01/23/2020  Component Date Value Ref Range Status  . Sodium 01/23/2020 139  135 - 145 mmol/L Final  .  Potassium 01/23/2020 3.7  3.5 - 5.1 mmol/L Final  . Chloride 01/23/2020 106  98 - 111 mmol/L Final  . CO2 01/23/2020 22  22 - 32 mmol/L Final  . Glucose, Bld 01/23/2020 100* 70 - 99 mg/dL Final   Glucose reference range applies only to samples taken after fasting for at least 8 hours.  . BUN 01/23/2020 11  6 - 20 mg/dL Final  . Creatinine, Ser 01/23/2020 0.77  0.44 - 1.00 mg/dL Final  . Calcium 01/23/2020 9.0  8.9 - 10.3 mg/dL Final  . Total Protein 01/23/2020 6.7  6.5 - 8.1 g/dL Final  . Albumin 01/23/2020 3.9  3.5 - 5.0 g/dL Final  . AST 01/23/2020 17  15 - 41 U/L Final  . ALT 01/23/2020 17  0 - 44 U/L Final  . Alkaline Phosphatase 01/23/2020 58  38 - 126 U/L Final  . Total Bilirubin 01/23/2020 0.4  0.3 - 1.2 mg/dL Final  . GFR, Estimated 01/23/2020 >60  >60 mL/min Final   Comment: (NOTE) Calculated using the CKD-EPI Creatinine Equation (2021)   . Anion gap 01/23/2020 11  5 - 15 Final   Performed at [facility], [place] [address] [place], [place] [address]  . Alcohol, Ethyl (B) 01/23/2020 <10  <10 mg/dL Final   Comment: (NOTE) Lowest detectable limit for serum alcohol is 10 mg/dL.  For medical purposes only. Performed at [facility], [place] [address], [place], [place] [address]   . Salicylate Lvl [id number] <7.0* 7.0 - 30.0 mg/dL Final   Performed at [place] [address], [place], [place] [address]  . Acetaminophen (Tylenol), Serum 01/23/2020 <10* 10 - 30 ug/mL Final   Comment: (NOTE) Therapeutic concentrations vary significantly. A range of 10-30 ug/mL  may be an effective concentration for many patients. However, some  are best treated at concentrations outside of this range. Acetaminophen concentrations >150 ug/mL at 4 hours after ingestion  and >50 ug/mL at 12 hours after ingestion are often associated with  toxic reactions.  Performed at [facility], [place] [address] [place], [place]  [address]   . WBC 01/23/2020 7.2  4.0 - 10.5 K/uL Final  . RBC 01/23/2020 4.57  3.87 - 5.11 MIL/uL Final  . Hemoglobin 01/23/2020 12.1  12.0 - 15.0 g/dL Final  . HCT 01/23/2020 38.1  36.0 - 46.0 % Final  . MCV 01/23/2020 83.4  80.0 - 100.0 fL Final  . MCH 01/23/2020 26.5  26.0 - 34.0 pg Final  . MCHC  01/23/2020 31.8  30.0 - 36.0 g/dL Final  . RDW 01/23/2020 13.3  11.5 - 15.5 % Final  . Platelets 01/23/2020 251  150 - 400 K/uL Final  . nRBC 01/23/2020 0.0  0.0 - 0.2 % Final   Performed at [facility], [place] [address], [place], [place] [address]  . I-stat hCG, quantitative 01/23/2020 <5.0  <5 mIU/mL Final  . Comment 3 01/23/2020          Final   Comment:   GEST. AGE      CONC.  (mIU/mL)   <=1 WEEK        5 - 50     2 WEEKS       50 - 500     3 WEEKS       100 - 10,000     4 WEEKS     1,000 - 30,000        FEMALE AND NON-PREGNANT FEMALE:     LESS THAN 5 mIU/mL   . SARS Coronavirus 2 by RT PCR 01/23/2020 NEGATIVE  NEGATIVE Final   Comment: (NOTE) SARS-CoV-2 target nucleic acids are NOT DETECTED.  The SARS-CoV-2 RNA is generally detectable in upper respiratory specimens during the acute phase of infection. The lowest concentration of SARS-CoV-2 viral copies this assay can detect is 138 copies/mL. A negative result does not preclude SARS-Cov-2 infection and should not be used as the sole basis for treatment or other patient management decisions. A negative result may occur with  improper specimen collection/handling, submission of specimen other than nasopharyngeal swab, presence of viral mutation(s) within the areas targeted by this assay, and inadequate number of viral copies(<138 copies/mL). A negative result must be combined with clinical observations, patient history, and epidemiological information. The expected result is Negative.  Fact Sheet for Patients:  EntrepreneurPulse.com.au  Fact Sheet for Healthcare Providers:   IncredibleEmployment.be  This test is no                          t yet approved or cleared by the [place] FDA and  has been authorized for detection and/or diagnosis of SARS-CoV-2 by FDA under an Emergency Use Authorization (EUA). This EUA will remain  in effect (meaning this test can be used) for the duration of the COVID-19 declaration under Section 564(b)(1) of the Act, 21 U.S.C.section 360bbb-3(b)(1), unless the authorization is terminated  or revoked sooner.      . Influenza A by PCR 01/23/2020 NEGATIVE  NEGATIVE Final  . Influenza B by PCR 01/23/2020 NEGATIVE  NEGATIVE Final   Comment: (NOTE) The Xpert Xpress SARS-CoV-2/FLU/RSV plus assay is intended as an aid in the diagnosis of influenza from Nasopharyngeal swab specimens and should not be used as a sole basis for treatment. Nasal washings and aspirates are unacceptable for Xpert Xpress SARS-CoV-2/FLU/RSV testing.  Fact Sheet for Patients: EntrepreneurPulse.com.au  Fact Sheet for Healthcare Providers: IncredibleEmployment.be  This test is not yet approved or cleared by the [place] FDA and has been authorized for detection and/or diagnosis of SARS-CoV-2 by FDA under an Emergency Use Authorization (EUA). This EUA will remain in effect (meaning this test can be used) for the duration of the COVID-19 declaration under Section 564(b)(1) of the Act, 21 U.S.C. section 360bbb-3(b)(1), unless the authorization is terminated or revoked.  Performed at [facility], [place] [address] [place], [place] [address]   Admission on 01/17/2020, Discharged on 01/22/2020  Component Date Value Ref  Range Status  . Cholesterol 01/18/2020 173  0 - 200 mg/dL Final  . Triglycerides 01/18/2020 159* <150 mg/dL Final  . HDL 01/18/2020 49  >40 mg/dL Final  . Total CHOL/HDL Ratio 01/18/2020 3.5  RATIO Final  . VLDL 01/18/2020 32  0 - 40 mg/dL Final  . LDL  Cholesterol 01/18/2020 92  0 - 99 mg/dL Final   Comment:        Total Cholesterol/HDL:CHD Risk Coronary Heart Disease Risk Table                     Men   Women  1/2 Average Risk   3.4   3.3  Average Risk       5.0   4.4  2 X Average Risk   9.6   7.1  3 X Average Risk  23.4   11.0        Use the calculated Patient Ratio above and the CHD Risk Table to determine the patient's CHD Risk.        ATP III CLASSIFICATION (LDL):  <100     mg/dL   Optimal  100-129  mg/dL   Near or Above                    Optimal  130-159  mg/dL   Borderline  160-189  mg/dL   High  >190     mg/dL   Very High Performed at [place] [address], [place], [place] [address]   . Free T4 01/18/2020 0.97  0.61 - 1.12 ng/dL Final   Comment: (NOTE) Biotin ingestion may interfere with free T4 tests. If the results are inconsistent with the TSH level, previous test results, or the clinical presentation, then consider biotin interference. If needed, order repeat testing after stopping biotin. Performed at [facility], [place] [address], [place], [place] [address]   . TSH 01/18/2020 2.470  0.350 - 4.500 uIU/mL Final   Comment: Performed by a 3rd Generation assay with a functional sensitivity of <=0.01 uIU/mL. Performed at [facility], [place] [address] [place], [place] [address]   . Prolactin 01/18/2020 128.0* 4.8 - 23.3 ng/mL Final   Comment: (NOTE) Performed At: [facility] [place], [place] [id number] Rush Farmer MD [id number]   . Prolactin 01/19/2020 129.0* 4.8 - 23.3 ng/mL Final   Comment: (NOTE) Performed At: [facility] [place], [place] [id number] Rush Farmer MD [id number]   Admission on 01/15/2020, Discharged on 01/17/2020  Component Date Value Ref Range Status  . Color, Urine 01/15/2020 COLORLESS* YELLOW Final  . APPearance 01/15/2020 CLEAR  CLEAR Final  . Specific Gravity, Urine  01/15/2020 1.001* 1.005 - 1.030 Final  . pH 01/15/2020 6.0  5.0 - 8.0 Final  . Glucose, UA 01/15/2020 NEGATIVE  NEGATIVE mg/dL Final  . Hgb urine dipstick 01/15/2020 SMALL* NEGATIVE Final  . Bilirubin Urine 01/15/2020 NEGATIVE  NEGATIVE Final  . Ketones, ur 01/15/2020 NEGATIVE  NEGATIVE mg/dL Final  . Protein, ur 01/15/2020 NEGATIVE  NEGATIVE mg/dL Final  . Nitrite 01/15/2020 NEGATIVE  NEGATIVE Final  . Chal[place] Guest 01/15/2020 NEGATIVE  NEGATIVE Final  . Bacteria, UA 01/15/2020 NONE SEEN  NONE SEEN Final   Performed at [place] [address], [place], [place] [address]  . WBC 01/15/2020 7.4  4.0 - 10.5 K/uL Final  . RBC 01/15/2020 4.96  3.87 - 5.11 MIL/uL Final  . Hemoglobin 01/15/2020 13.1  12.0 - 15.0 g/dL Final  . HCT 01/15/2020 41.3  36.0 - 46.0 % Final  . MCV 01/15/2020 83.3  80.0 - 100.0 fL Final  . MCH 01/15/2020 26.4  26.0 - 34.0 pg Final  . MCHC 01/15/2020 31.7  30.0 - 36.0 g/dL Final  . RDW 01/15/2020 13.7  11.5 - 15.5 % Final  . Platelets 01/15/2020 230  150 - 400 K/uL Final  . nRBC 01/15/2020 0.0  0.0 - 0.2 % Final  . Neutrophils Relative % 01/15/2020 61  % Final  . Neutro Abs 01/15/2020 4.4  1.7 - 7.7 K/uL Final  . Lymphocytes Relative 01/15/2020 34  % Final  . Lymphs Abs 01/15/2020 2.5  0.7 - 4.0 K/uL Final  . Monocytes Relative 01/15/2020 5  % Final  . Monocytes Absolute 01/15/2020 0.4  0.1 - 1.0 K/uL Final  . Eosinophils Relative 01/15/2020 0  % Final  . Eosinophils Absolute 01/15/2020 0.0  0.0 - 0.5 K/uL Final  . Basophils Relative 01/15/2020 0  % Final  . Basophils Absolute 01/15/2020 0.0  0.0 - 0.1 K/uL Final  . Immature Granulocytes 01/15/2020 0  % Final  . Abs Immature Granulocytes 01/15/2020 0.02  0.00 - 0.07 K/uL Final   Performed at [facility], [place] [address], [place], [place] [address]  . Sodium 01/15/2020 140  135 - 145 mmol/L Final  . Potassium 01/15/2020 3.6  3.5 - 5.1 mmol/L Final  . Chloride 01/15/2020  107  98 - 111 mmol/L Final  . CO2 01/15/2020 21* 22 - 32 mmol/L Final  . Glucose, Bld 01/15/2020 101* 70 - 99 mg/dL Final   Glucose reference range applies only to samples taken after fasting for at least 8 hours.  . BUN 01/15/2020 7  6 - 20 mg/dL Final  . Creatinine, Ser 01/15/2020 0.78  0.44 - 1.00 mg/dL Final  . Calcium 01/15/2020 9.6  8.9 - 10.3 mg/dL Final  . Total Protein 01/15/2020 7.9  6.5 - 8.1 g/dL Final  . Albumin 01/15/2020 4.6  3.5 - 5.0 g/dL Final  . AST 01/15/2020 20  15 - 41 U/L Final  . ALT 01/15/2020 20  0 - 44 U/L Final  . Alkaline Phosphatase 01/15/2020 66  38 - 126 U/L Final  . Total Bilirubin 01/15/2020 0.6  0.3 - 1.2 mg/dL Final  . GFR, Estimated 01/15/2020 >60  >60 mL/min Final   Comment: (NOTE) Calculated using the CKD-EPI Creatinine Equation (2021)   . Anion gap 01/15/2020 12  5 - 15 Final   Performed at [facility], [place] [address] [place], [place] [address]  . Alcohol, Ethyl (B) 01/15/2020 <10  <10 mg/dL Final   Comment: (NOTE) Lowest detectable limit for serum alcohol is 10 mg/dL.  For medical purposes only. Performed at [facility], [place] [address] [place], [place] [address]   . Salicylate Lvl [id number] <7.0* 7.0 - 30.0 mg/dL Final   Performed at [facility] [address] [place], [place] [address]  . Acetaminophen (Tylenol), Serum 01/15/2020 <10* 10 - 30 ug/mL Final   Comment: (NOTE) Therapeutic concentrations vary significantly. A range of 10-30 ug/mL  may be an effective concentration for many patients. However, some  are best treated at concentrations outside of this range. Acetaminophen concentrations >150 ug/mL at 4 hours after ingestion  and >50 ug/mL at 12 hours after ingestion are often associated with  toxic reactions.  Performed at [facility], [place] [address] [place],  [place] [address]   . Opiates 01/15/2020 NONE DETECTED  NONE DETECTED Final  .  Cocaine 01/15/2020 NONE DETECTED  NONE DETECTED Final  . Benzodiazepines 01/15/2020 NONE DETECTED  NONE DETECTED Final  . Amphetamines 01/15/2020 NONE DETECTED  NONE DETECTED Final  . Tetrahydrocannabinol 01/15/2020 NONE DETECTED  NONE DETECTED Final  . Barbiturates 01/15/2020 NONE DETECTED  NONE DETECTED Final   Comment: (NOTE) DRUG SCREEN FOR MEDICAL PURPOSES ONLY.  IF CONFIRMATION IS NEEDED FOR ANY PURPOSE, NOTIFY LAB WITHIN 5 DAYS.  LOWEST DETECTABLE LIMITS FOR URINE DRUG SCREEN Drug Class                     Cutoff (ng/mL) Amphetamine and metabolites    1000 Barbiturate and metabolites    200 Benzodiazepine                 [id number] Tricyclics and metabolites     300 Opiates and metabolites        300 Cocaine and metabolites        300 THC                            50 Performed at [facility], [place] [address], [place], [place] [address]   . Magnesium 01/16/2020 1.9  1.7 - 2.4 mg/dL Final   Performed at [place] [address] [place], [place] [address]  . SARS Coronavirus 2 by RT PCR 01/16/2020 NEGATIVE  NEGATIVE Final   Comment: (NOTE) SARS-CoV-2 target nucleic acids are NOT DETECTED.  The SARS-CoV-2 RNA is generally detectable in upper respiratory specimens during the acute phase of infection. The lowest concentration of SARS-CoV-2 viral copies this assay can detect is 138 copies/mL. A negative result does not preclude SARS-Cov-2 infection and should not be used as the sole basis for treatment or other patient management decisions. A negative result may occur with  improper specimen collection/handling, submission of specimen other than nasopharyngeal swab, presence of viral mutation(s) within the areas targeted by this assay, and inadequate number of viral copies(<138 copies/mL). A negative result must be combined with clinical observations, patient history, and epidemiological information. The expected result is  Negative.  Fact Sheet for Patients:  EntrepreneurPulse.com.au  Fact Sheet for Healthcare Providers:  IncredibleEmployment.be  This test is no                          t yet approved or cleared by the [place] FDA and  has been authorized for detection and/or diagnosis of SARS-CoV-2 by FDA under an Emergency Use Authorization (EUA). This EUA will remain  in effect (meaning this test can be used) for the duration of the COVID-19 declaration under Section 564(b)(1) of the Act, 21 U.S.C.section 360bbb-3(b)(1), unless the authorization is terminated  or revoked sooner.      . Influenza A by PCR 01/16/2020 NEGATIVE  NEGATIVE Final  . Influenza B by PCR 01/16/2020 NEGATIVE  NEGATIVE Final   Comment: (NOTE) The Xpert Xpress SARS-CoV-2/FLU/RSV plus assay is intended as an aid in the diagnosis of influenza from Nasopharyngeal swab specimens and should not be used as a sole basis for treatment. Nasal washings and aspirates are unacceptable for Xpert Xpress SARS-CoV-2/FLU/RSV testing.  Fact Sheet for Patients: EntrepreneurPulse.com.au  Fact Sheet for Healthcare Providers: IncredibleEmployment.be  This test is not yet approved or cleared by the [place] and has been authorized for  detection and/or diagnosis of SARS-CoV-2 by FDA under an Emergency Use Authorization (EUA). This EUA will remain in effect (meaning this test can be used) for the duration of the COVID-19 declaration under Section 564(b)(1) of the Act, 21 U.S.C. section 360bbb-3(b)(1), unless the authorization is terminated or revoked.  Performed at [facility], [place] [address] [place], [place] [address]   . I-stat hCG, quantitative 01/16/2020 <5.0  <5 mIU/mL Final  . Comment 3 01/16/2020          Final   Comment:   GEST. AGE      CONC.  (mIU/mL)   <=1 WEEK        5 - 50     2 WEEKS       50 - 500     3 WEEKS        100 - 10,000     4 WEEKS     1,000 - 30,000        FEMALE AND NON-PREGNANT FEMALE:     LESS THAN 5 mIU/mL   . Sodium 01/16/2020 141  135 - 145 mmol/L Final  . Potassium 01/16/2020 3.8  3.5 - 5.1 mmol/L Final  . Chloride 01/16/2020 112* 98 - 111 mmol/L Final  . CO2 01/16/2020 17* 22 - 32 mmol/L Final  . Glucose, Bld 01/16/2020 114* 70 - 99 mg/dL Final   Glucose reference range applies only to samples taken after fasting for at least 8 hours.  . BUN 01/16/2020 7  6 - 20 mg/dL Final  . Creatinine, Ser 01/16/2020 0.84  0.44 - 1.00 mg/dL Final  . Calcium 01/16/2020 8.1* 8.9 - 10.3 mg/dL Final  . GFR, Estimated 01/16/2020 >60  >60 mL/min Final   Comment: (NOTE) Calculated using the CKD-EPI Creatinine Equation (2021)   . Anion gap 01/16/2020 12  5 - 15 Final   Performed at [facility], [place] [address] [place], [place] [address]  . Magnesium 01/16/2020 1.9  1.7 - 2.4 mg/dL Final   Performed at [place] [address] [place], [place] [address]  Admission on 01/13/2020, Discharged on 01/14/2020  Component Date Value Ref Range Status  . SARS Coronavirus 2 by RT PCR 01/14/2020 NEGATIVE  NEGATIVE Final   Comment: (NOTE) SARS-CoV-2 target nucleic acids are NOT DETECTED.  The SARS-CoV-2 RNA is generally detectable in upper respiratory specimens during the acute phase of infection. The lowest concentration of SARS-CoV-2 viral copies this assay can detect is 138 copies/mL. A negative result does not preclude SARS-Cov-2 infection and should not be used as the sole basis for treatment or other patient management decisions. A negative result may occur with  improper specimen collection/handling, submission of specimen other than nasopharyngeal swab, presence of viral mutation(s) within the areas targeted by this assay, and inadequate number of viral copies(<138 copies/mL). A negative result must be combined with clinical observations, patient history,  and epidemiological information. The expected result is Negative.  Fact Sheet for Patients:  EntrepreneurPulse.com.au  Fact Sheet for Healthcare Providers:  IncredibleEmployment.be  This test is no                          t yet approved or cleared by the [place] FDA and  has been authorized for detection and/or diagnosis of SARS-CoV-2 by FDA under an Emergency Use Authorization (EUA). This EUA will remain  in effect (meaning this test can be used) for the duration of the  COVID-19 declaration under Section 564(b)(1) of the Act, 21 U.S.C.section 360bbb-3(b)(1), unless the authorization is terminated  or revoked sooner.      . Influenza A by PCR 01/14/2020 NEGATIVE  NEGATIVE Final  . Influenza B by PCR 01/14/2020 NEGATIVE  NEGATIVE Final   Comment: (NOTE) The Xpert Xpress SARS-CoV-2/FLU/RSV plus assay is intended as an aid in the diagnosis of influenza from Nasopharyngeal swab specimens and should not be used as a sole basis for treatment. Nasal washings and aspirates are unacceptable for Xpert Xpress SARS-CoV-2/FLU/RSV testing.  Fact Sheet for Patients: EntrepreneurPulse.com.au  Fact Sheet for Healthcare Providers: IncredibleEmployment.be  This test is not yet approved or cleared by the [place] FDA and has been authorized for detection and/or diagnosis of SARS-CoV-2 by FDA under an Emergency Use Authorization (EUA). This EUA will remain in effect (meaning this test can be used) for the duration of the COVID-19 declaration under Section 564(b)(1) of the Act, 21 U.S.C. section 360bbb-3(b)(1), unless the authorization is terminated or revoked.  Performed at [facility], [place] [address] [place], [place] [address]   . SARS Coronavirus 2 Ag 01/14/2020 Negative  Negative Preliminary  . POC Amphetamine UR 01/14/2020 None Detected  None Detected Preliminary  . POC Secobarbital (BAR)  01/14/2020 None Detected  None Detected Preliminary  . POC Buprenorphine (BUP) 01/14/2020 None Detected  None Detected Preliminary  . POC Oxazepam (BZO) 01/14/2020 None Detected  None Detected Preliminary  . POC Cocaine UR 01/14/2020 None Detected  None Detected Preliminary  . POC Methamphetamine UR 01/14/2020 None Detected  None Detected Preliminary  . POC Morphine 01/14/2020 None Detected  None Detected Preliminary  . POC Oxycodone UR 01/14/2020 None Detected  None Detected Preliminary  . POC Methadone UR 01/14/2020 None Detected  None Detected Preliminary  . POC Marijuana UR 01/14/2020 None Detected  None Detected Preliminary  . Preg Test, Ur 01/14/2020 NEGATIVE  NEGATIVE Final   Comment:        THE SENSITIVITY OF THIS METHODOLOGY IS >24 mIU/mL   Admission on 01/13/2020, Discharged on 01/13/2020  Component Date Value Ref Range Status  . Lipase 01/13/2020 38  11 - 51 U/L Final   Performed at [facility], [place] [address] [place], [place] [address]  . Sodium 01/13/2020 139  135 - 145 mmol/L Final  . Potassium 01/13/2020 3.7  3.5 - 5.1 mmol/L Final  . Chloride 01/13/2020 107  98 - 111 mmol/L Final  . CO2 01/13/2020 22  22 - 32 mmol/L Final  . Glucose, Bld 01/13/2020 94  70 - 99 mg/dL Final   Glucose reference range applies only to samples taken after fasting for at least 8 hours.  . BUN 01/13/2020 11  6 - 20 mg/dL Final  . Creatinine, Ser 01/13/2020 0.70  0.44 - 1.00 mg/dL Final  . Calcium 01/13/2020 9.4  8.9 - 10.3 mg/dL Final  . Total Protein 01/13/2020 7.0  6.5 - 8.1 g/dL Final  . Albumin 01/13/2020 4.2  3.5 - 5.0 g/dL Final  . AST 01/13/2020 21  15 - 41 U/L Final  . ALT 01/13/2020 17  0 - 44 U/L Final  . Alkaline Phosphatase 01/13/2020 65  38 - 126 U/L Final  . Total Bilirubin 01/13/2020 0.3  0.3 - 1.2 mg/dL Final  . GFR, Estimated 01/13/2020 >60  >60 mL/min Final   Comment: (NOTE) Calculated using the CKD-EPI Creatinine Equation (2021)   . Anion gap 01/13/2020 10  5 -  15 Final  Performed at [facility], [place] [address], [place], [place] [address]  . WBC 01/13/2020 7.6  4.0 - 10.5 K/uL Final  . RBC 01/13/2020 4.77  3.87 - 5.11 MIL/uL Final  . Hemoglobin 01/13/2020 12.7  12.0 - 15.0 g/dL Final  . HCT 01/13/2020 39.2  36.0 - 46.0 % Final  . MCV 01/13/2020 82.2  80.0 - 100.0 fL Final  . MCH 01/13/2020 26.6  26.0 - 34.0 pg Final  . MCHC 01/13/2020 32.4  30.0 - 36.0 g/dL Final  . RDW 01/13/2020 13.6  11.5 - 15.5 % Final  . Platelets 01/13/2020 271  150 - 400 K/uL Final  . nRBC 01/13/2020 0.0  0.0 - 0.2 % Final   Performed at [facility], [place] [address] [place], [place] [address]  . I-stat hCG, quantitative 01/13/2020 <5.0  <5 mIU/mL Final  . Comment 3 01/13/2020          Final   Comment:   GEST. AGE      CONC.  (mIU/mL)   <=1 WEEK        5 - 50     2 WEEKS       50 - 500     3 WEEKS       100 - 10,000     4 WEEKS     1,000 - 30,000        FEMALE AND NON-PREGNANT FEMALE:     LESS THAN 5 mIU/mL   Admission on 01/08/2020, Discharged on 01/08/2020  Component Date Value Ref Range Status  . Lipase 01/08/2020 42  11 - 51 U/L Final   Performed at [facility], [place] [address] [place], [place] [address]  . Sodium 01/08/2020 137  135 - 145 mmol/L Final  . Potassium 01/08/2020 4.0  3.5 - 5.1 mmol/L Final  . Chloride 01/08/2020 108  98 - 111 mmol/L Final  . CO2 01/08/2020 20* 22 - 32 mmol/L Final  . Glucose, Bld 01/08/2020 103* 70 - 99 mg/dL Final   Glucose reference range applies only to samples taken after fasting for at least 8 hours.  . BUN 01/08/2020 7  6 - 20 mg/dL Final  . Creatinine, Ser 01/08/2020 0.72  0.44 - 1.00 mg/dL Final  . Calcium 01/08/2020 9.2  8.9 - 10.3 mg/dL Final  . Total Protein 01/08/2020 8.0  6.5 - 8.1 g/dL Final  . Albumin 01/08/2020 4.6  3.5 - 5.0 g/dL Final  . AST 01/08/2020 22  15 - 41 U/L Final  . ALT 01/08/2020 20  0 - 44 U/L Final  . Alkaline Phosphatase 01/08/2020 67  38 - 126 U/L Final  .  Total Bilirubin 01/08/2020 0.3  0.3 - 1.2 mg/dL Final  . GFR, Estimated 01/08/2020 >60  >60 mL/min Final   Comment: (NOTE) Calculated using the CKD-EPI Creatinine Equation (2021)   . Anion gap 01/08/2020 9  5 - 15 Final   Performed at [facility], [place] [address] [place], [place] [address]  . WBC 01/08/2020 7.0  4.0 - 10.5 K/uL Final  . RBC 01/08/2020 5.01  3.87 - 5.11 MIL/uL Final  . Hemoglobin 01/08/2020 13.3  12.0 - 15.0 g/dL Final  . HCT 01/08/2020 41.6  36.0 - 46.0 % Final  . MCV 01/08/2020 83.0  80.0 - 100.0 fL Final  . MCH 01/08/2020 26.5  26.0 - 34.0 pg Final  . MCHC 01/08/2020 32.0  30.0 - 36.0 g/dL Final  . RDW 01/08/2020  13.8  11.5 - 15.5 % Final  . Platelets 01/08/2020 268  150 - 400 K/uL Final  . nRBC 01/08/2020 0.0  0.0 - 0.2 % Final   Performed at [facility], [place] [address] [place], [place] [address]  . Color, Urine 01/08/2020 YELLOW  YELLOW Final  . APPearance 01/08/2020 CLEAR  CLEAR Final  . Specific Gravity, Urine 01/08/2020 1.020  1.005 - 1.030 Final  . pH 01/08/2020 5.0  5.0 - 8.0 Final  . Glucose, UA 01/08/2020 NEGATIVE  NEGATIVE mg/dL Final  . Hgb urine dipstick 01/08/2020 SMALL* NEGATIVE Final  . Bilirubin Urine 01/08/2020 NEGATIVE  NEGATIVE Final  . Ketones, ur 01/08/2020 NEGATIVE  NEGATIVE mg/dL Final  . Protein, ur 01/08/2020 NEGATIVE  NEGATIVE mg/dL Final  . Nitrite 01/08/2020 NEGATIVE  NEGATIVE Final  . Chalmers Guest 01/08/2020 NEGATIVE  NEGATIVE Final  . RBC / HPF 01/08/2020 0-5  0 - 5 RBC/hpf Final  . WBC, UA 01/08/2020 0-5  0 - 5 WBC/hpf Final  . Bacteria, UA 01/08/2020 RARE* NONE SEEN Final   Performed at [facility], [place] [address] [place], [place] [address]  . Preg, Serum 01/08/2020 NEGATIVE  NEGATIVE Final   Comment:        THE SENSITIVITY OF THIS METHODOLOGY IS >10 mIU/mL. Performed at [facility], [place] [address], [place], [name] [address]   .  Glucose-Capillary 01/08/2020 97  70 - 99 mg/dL Final   Glucose reference range applies only to samples taken after fasting for at least 8 hours.  There may be more visits with results that are not included.    Blood Alcohol level:  Lab Results  Component Value Date   ETH <10 01/30/2020   ETH <10 [id number]    Metabolic Disorder Labs: Lab Results  Component Value Date   HGBA1C 5.0 07/02/2019   MPG 96.8 07/02/2019   MPG 91.06 03/06/2019   Lab Results  Component Value Date   PROLACTIN 129.0 (H) 01/19/2020   PROLACTIN 128.0 (H) 01/18/2020   Lab Results  Component Value Date   CHOL 173 01/18/2020   TRIG 159 (H) 01/18/2020   HDL 49 01/18/2020   CHOLHDL 3.5 01/18/2020   VLDL 32 01/18/2020   LDLCALC 92 01/18/2020   LDLCALC 122 (H) 07/02/2019    Therapeutic Lab Levels: No results found for: LITHIUM Lab Results  Component Value Date   VALPROATE 48 (L) 07/10/2015   VALPROATE 84 05/31/2015   No components found for:  CBMZ  Physical Findings   AIMS   Flowsheet Row Admission (Discharged) from 01/17/2020 in [place] [facility] Admission (Discharged) from 07/08/2019 in [place] [facility] Admission (Discharged) from 07/01/2019 in [place] [facility] Admission (Discharged) from 03/05/2019 in [place] [facility]  AIMS Total Score 0 0 0 0    AUDIT   Flowsheet Row Admission (Discharged) from 01/17/2020 in [place] [facility] Admission (Discharged) from 07/08/2019 in [place] [facility] Admission (Discharged) from 07/01/2019 in [place] [facility] Admission (Discharged) from 03/05/2019 in [place] [facility] Admission (Discharged) from 07/24/2017 in [place]  Alcohol Use Disorder Identification Test Final Score (AUDIT) 0 0 0 0 0    GAD-7   Flowsheet Row Office  Visit from 08/20/2016 in [place] [facility] from 04/03/2016 in [place] for [facility] Routine Prenatal from 03/14/2016  in [place] for [facility] Routine Prenatal from 03/08/2016 in [place] for [facility] Routine Prenatal from 03/01/2016 in [place] for [facility]  Total GAD-7 Score 1 19 13 9 5     PHQ2-9   [place] [facility] from 02/03/2020 in [facility] ED from 07/26/2019 in [facility] Office Visit from 08/20/2016 in [place] for [place] from 04/03/2016 in [place] for [facility] Routine Prenatal from 03/14/2016 in [place] for [facility]  PHQ-2 Total Score 6 5 0 3 2  PHQ-9 Total Score 27 15 1 16 8        Musculoskeletal  Strength & Muscle Tone: within normal limits Gait & Station: normal Patient leans: N/A  Psychiatric Specialty Exam  Presentation  General Appearance: Appropriate for Environment; Neat  Eye Contact:Fair  Speech:Clear and Coherent  Speech Volume:Normal  Handedness:Left   Mood and Affect  Mood:Anxious; Depressed  Affect:Congruent   Thought Process  Thought Processes:Coherent  Descriptions of Associations:Intact  Orientation:Full (Time, Place and Person)  Thought Content:Logical  Hallucinations:Hallucinations: None  Ideas of Reference:None  Suicidal Thoughts:Suicidal Thoughts: Yes, Active SI Active Intent and/or Plan: With Intent; With Plan; With Means to Carry Out  Homicidal Thoughts:Homicidal Thoughts: Yes, Passive HI Passive Intent and/or Plan: Without Intent; Without Plan   Sensorium  Memory:Immediate Good; Recent Good; Remote Good  Judgment:Intact  Insight:Lacking   Executive Functions  Concentration:Good  Attention Span:Good  [place] of  Knowledge:Fair  Language:Fair   Psychomotor Activity  Psychomotor Activity:Psychomotor Activity: Restlessness   Assets  Assets:Communication Skills; Desire for Improvement; Financial Resources/Insurance; Housing; Physical Health   Sleep  Sleep:Sleep: Fair   Physical Exam  Physical Exam Vitals and nursing note reviewed.  Constitutional:      Appearance: She is well-developed.  HENT:     Head: Normocephalic.  Eyes:     Pupils: Pupils are equal, round, and reactive to light.  Cardiovascular:     Rate and Rhythm: Normal rate.  Pulmonary:     Effort: Pulmonary effort is normal.  Musculoskeletal:        General: Normal range of motion.  Neurological:     Mental Status: She is alert and oriented to person, place, and time.    Review of Systems  Constitutional: Negative.   HENT: Negative.   Eyes: Negative.   Respiratory: Negative.   Cardiovascular: Negative.   Gastrointestinal: Negative.   Genitourinary: Negative.   Musculoskeletal: Negative.   Skin: Negative.   Neurological: Negative.   Endo/Heme/Allergies: Negative.   Psychiatric/Behavioral: Positive for depression and suicidal ideas.   Blood pressure (!) 112/51, pulse 96, temperature 97.9 F (36.6 C), temperature source Temporal, resp. rate 18, height 5\' 4"  (1.626 m), weight 212 lb (96.2 kg), SpO2 100 %. Body mass index is 36.39 kg/m.  Treatment Plan Summary: Daily contact with patient to assess and evaluate symptoms and progress in treatment and refered out for inpatient psychiatric treatment  Lewis Shock, FNP 02/03/2020 3:22 PM

## 2020-02-03 NOTE — ED Notes (Signed)
Pt sleeping @this time. breathing even and unlabored. 

## 2020-02-03 NOTE — ED Notes (Signed)
Pt resting with eyes closed. Rise and fall of chest noted. No new issues noted. Will continue to monitor for safety 

## 2020-02-03 NOTE — ED Triage Notes (Signed)
Patient reports SI with plan to "put a knife right through my heart if you send me home again."  Patient reported living alone but would like to go to a family care home.  Stated her family has told her "to kill myself.  Nobody cares anymore."  Stated here 2 weeks.  Stated next day went home and took pills.  Last Saturday "I cut my arm" and went to [facility].  "They revoked my involuntary".  Patient endorsed thoughts to harm police "with that same knife".  Denied [facility].  Rated anxiety and depression 10/10.

## 2020-02-03 NOTE — ED Notes (Signed)
Pt stated still having thoughts of stabbing herself in chest and would stab "any motherfucker that tried to get in my way." pt stated this is low and shaky voice with fair eye contact.

## 2020-02-03 NOTE — Progress Notes (Signed)
Per Dr. Bronwen Betters, MD, the patient has been recommended for inpatient psychiatric treatment services. Patient has been referred to the following facilities for possible placement:     - [place] - [facility] - [facility] - [facility] - [facility] - [facility]  - [facility] - [facility] - [facility] - [facility]       Baldo Daub, MSW, LCSW Clinical Social Worker (Facility Based Crisis) [facility]

## 2020-02-04 ENCOUNTER — Inpatient Hospital Stay (HOSPITAL_COMMUNITY)
Admission: AD | Admit: 2020-02-04 | Discharge: 2020-02-08 | DRG: 885 | Disposition: A | Discharge status: Home / Self Care | Payer: PPO | Source: Intra-hospital | Attending: Psychiatry | Admitting: Psychiatry

## 2020-02-04 ENCOUNTER — Other Ambulatory Visit: Payer: Self-pay

## 2020-02-04 ENCOUNTER — Encounter (HOSPITAL_COMMUNITY): Payer: Self-pay | Admitting: Psychiatry

## 2020-02-04 DIAGNOSIS — K219 Gastro-esophageal reflux disease without esophagitis: Secondary | ICD-10-CM | POA: Diagnosis present

## 2020-02-04 DIAGNOSIS — G43909 Migraine, unspecified, not intractable, without status migrainosus: Secondary | ICD-10-CM | POA: Diagnosis present

## 2020-02-04 DIAGNOSIS — K30 Functional dyspepsia: Secondary | ICD-10-CM | POA: Diagnosis not present

## 2020-02-04 DIAGNOSIS — F840 Autistic disorder: Secondary | ICD-10-CM | POA: Diagnosis present

## 2020-02-04 DIAGNOSIS — Z20822 Contact with and (suspected) exposure to covid-19: Secondary | ICD-10-CM | POA: Diagnosis present

## 2020-02-04 DIAGNOSIS — F250 Schizoaffective disorder, bipolar type: Secondary | ICD-10-CM | POA: Diagnosis not present

## 2020-02-04 DIAGNOSIS — J45909 Unspecified asthma, uncomplicated: Secondary | ICD-10-CM | POA: Diagnosis present

## 2020-02-04 DIAGNOSIS — K5909 Other constipation: Secondary | ICD-10-CM | POA: Diagnosis present

## 2020-02-04 DIAGNOSIS — Z87891 Personal history of nicotine dependence: Secondary | ICD-10-CM

## 2020-02-04 DIAGNOSIS — Z818 Family history of other mental and behavioral disorders: Secondary | ICD-10-CM

## 2020-02-04 DIAGNOSIS — G4700 Insomnia, unspecified: Secondary | ICD-10-CM | POA: Diagnosis present

## 2020-02-04 DIAGNOSIS — R45851 Suicidal ideations: Secondary | ICD-10-CM | POA: Diagnosis not present

## 2020-02-04 DIAGNOSIS — F4310 Post-traumatic stress disorder, unspecified: Secondary | ICD-10-CM | POA: Diagnosis present

## 2020-02-04 DIAGNOSIS — F332 Major depressive disorder, recurrent severe without psychotic features: Secondary | ICD-10-CM | POA: Diagnosis not present

## 2020-02-04 DIAGNOSIS — F259 Schizoaffective disorder, unspecified: Secondary | ICD-10-CM | POA: Diagnosis present

## 2020-02-04 MED ORDER — PANTOPRAZOLE SODIUM 40 MG PO TBEC
40.0000 mg | DELAYED_RELEASE_TABLET | Freq: Every day | ORAL | Status: DC
  Administered 2020-02-05 – 2020-02-08 (×4): 40 mg via ORAL
  Filled 2020-02-04 (×5): qty 1

## 2020-02-04 MED ORDER — ALUM & MAG HYDROXIDE-SIMETH 200-200-20 MG/5ML PO SUSP
30.0000 mL | ORAL | Status: DC | PRN
  Administered 2020-02-07: 30 mL via ORAL
  Filled 2020-02-04 (×2): qty 30

## 2020-02-04 MED ORDER — SERTRALINE HCL 50 MG PO TABS
150.0000 mg | ORAL_TABLET | Freq: Every day | ORAL | Status: DC
  Administered 2020-02-05 – 2020-02-08 (×4): 150 mg via ORAL
  Filled 2020-02-04 (×5): qty 1

## 2020-02-04 MED ORDER — ACETAMINOPHEN 325 MG PO TABS: 650.0000 mg | ORAL_TABLET | Freq: Four times a day (QID) | ORAL | Status: DC | PRN

## 2020-02-04 MED ORDER — RISPERIDONE 3 MG PO TABS
3.0000 mg | ORAL_TABLET | Freq: Every day | ORAL | Status: DC
  Administered 2020-02-04 – 2020-02-07 (×4): 3 mg via ORAL
  Filled 2020-02-04 (×5): qty 1

## 2020-02-04 MED ORDER — RISPERIDONE 2 MG PO TBDP
2.0000 mg | ORAL_TABLET | Freq: Every day | ORAL | Status: DC
Start: 2020-02-04 — End: 2020-02-08
  Administered 2020-02-04 – 2020-02-08 (×5): 2 mg via ORAL
  Filled 2020-02-04 (×8): qty 1

## 2020-02-04 MED ORDER — ONDANSETRON 4 MG PO TBDP: 4.0000 mg | ORAL_TABLET | Freq: Three times a day (TID) | ORAL | Status: DC | PRN

## 2020-02-04 MED ORDER — SUMATRIPTAN SUCCINATE 50 MG PO TABS
50.0000 mg | ORAL_TABLET | Freq: Two times a day (BID) | ORAL | Status: DC | PRN
  Administered 2020-02-05 – 2020-02-06 (×2): 50 mg via ORAL
  Filled 2020-02-04 (×3): qty 1

## 2020-02-04 MED ORDER — ONDANSETRON HCL 4 MG PO TABS
4.0000 mg | ORAL_TABLET | Freq: Three times a day (TID) | ORAL | Status: DC | PRN
  Filled 2020-02-04: qty 1

## 2020-02-04 MED ORDER — TRAZODONE HCL 100 MG PO TABS
200.0000 mg | ORAL_TABLET | Freq: Every day | ORAL | Status: DC
  Administered 2020-02-04 – 2020-02-07 (×4): 200 mg via ORAL
  Filled 2020-02-04 (×5): qty 2

## 2020-02-04 MED ORDER — MOMETASONE FURO-FORMOTEROL FUM 200-5 MCG/ACT IN AERO
2.0000 | INHALATION_SPRAY | Freq: Two times a day (BID) | RESPIRATORY_TRACT | Status: DC
  Administered 2020-02-04 – 2020-02-08 (×8): 2 via RESPIRATORY_TRACT
  Filled 2020-02-04: qty 8.8

## 2020-02-04 MED ORDER — MAGNESIUM HYDROXIDE 400 MG/5ML PO SUSP: 30.0000 mL | Freq: Every day | ORAL | Status: DC | PRN

## 2020-02-04 MED ORDER — HYDROXYZINE HCL 25 MG PO TABS
25.0000 mg | ORAL_TABLET | Freq: Three times a day (TID) | ORAL | Status: DC | PRN
  Administered 2020-02-04 – 2020-02-07 (×6): 25 mg via ORAL
  Filled 2020-02-04 (×6): qty 1

## 2020-02-04 NOTE — ED Notes (Signed)
Pt transferred to [facility] for continuity of care in no acute distress. Continues to endorse SI with plan at time of discharge. Pt unable to contract for safety. Belongings given to transport service.

## 2020-02-04 NOTE — ED Notes (Signed)
Resting with eyes closed. Rise and fall of chest noted. No new issues noted at this time. Will continue to monitor for safety

## 2020-02-04 NOTE — ED Provider Notes (Signed)
FBC/OBS ASAP Discharge Summary  Date and Time: 02/04/2020 11:44 AM  Name: [name]  MRN:  [id number]   Discharge Diagnoses:  Final diagnoses:  Severe recurrent major depression without psychotic features ([place])  Borderline personality disorder ([place])    Subjective: Patient reports this morning that she still continues to feel suicidal and homicidal and does not feel safe discharging home.  She continues to report that she has worked with [facility] in the past and does have an ACT team through [facility].  Patient reports that she still feels that she needs to be hospitalized.  Patient was requesting a different facility to go to besides [place] [facility], however, patient has been denied at other facilities and [facility] has accepted her.  Stay Summary: Patient is a 29 year old female who presented to the [facility] yesterday endorsing suicidal ideations and homicidal ideations.  Patient reported having a plan to stab herself.  Patient also reported that her boyfriend had decided to leave and return to [facility] yesterday.  She reports that this is not a reason for her to be here but then states that she does not like being alone.  Patient has also reported prior to presenting to the [facility] that she had not been compliant with her medications.  Patient remained at the [facility] overnight and today has continued to endorse suicidal ideations with a plan to stab herself.  Patient had originally requested to be referred out to other facilities however she has been declined at all other facilities and [facility] has accepted her.  Patient will be transferred to [facility] for inpatient psychiatric treatment.  Total Time spent with patient: 20 minutes  Past Psychiatric History: Anxiety, depression, autism, schizoaffective disorder, numerous hospitalizations, borderline personality disorder. Past Medical History:  Past Medical History:  Diagnosis Date  . Acid reflux   . Anxiety   . Asthma    last attack 03/13/15 or 03/14/15  .  Autism   . 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 Chronic constipation   . Depression   . Drug-seeking behavior   . Essential tremor   . Headache   . Overdose of acetaminophen 07/2017   and other meds  . Personality disorder ([place])   . Schizo-affective psychosis ([place])   . Schizoaffective disorder, bipolar type ([place])   . Seizures [facility])    Last seizure December 2017  . Sleep apnea     Past Surgical History:  Procedure Laterality Date  . MOUTH SURGERY  2009 or 2010   Family History:  Family History  Problem Relation Age of Onset  . Mental illness Father   . Asthma Father   . PDD Brother   . Seizures Brother    Family Psychiatric History: Father unspecified mental illness Social History:  Social History   Substance and Sexual Activity  Alcohol Use No  . Alcohol/week: 1.0 standard drink  . Types: 1 Standard drinks or equivalent per week   Comment: denies at this time     Social History   Substance and Sexual Activity  Drug Use No   Comment: History of cocaine use at age 28 for 4 months    Social History   Socioeconomic History  . Marital status: Widowed    Spouse name: Not on file  . Number of children: 0  . Years of education: Not on file  . Highest education level: Not on file  Occupational History  . Occupation: disability  Tobacco Use  . Smoking status:  Former Smoker    Packs/day: 0.00  . Smokeless tobacco: Never Used  . Tobacco comment: Smoked for 2  years age [age]  Vaping Use  . Vaping Use: Never used  Substance and Sexual Activity  . Alcohol use: No    Alcohol/week: 1.0 standard drink    Types: 1 Standard drinks or equivalent per week    Comment: denies at this time  . Drug use: No    Comment: History of cocaine use at age 66 for 4 months  . Sexual activity: Yes    Birth control/protection: None  Other Topics Concern  . Not on file  Social History Narrative   Marital status: Widowed      Children: daughter      Lives: with boyfriend, in two  story home      Employment:  Disability      Tobacco: quit smoking; smoked for two years.      Alcohol ;none      Drugs: none   Has not traveled outside of the country.   Right handed         Social Determinants of Health   Financial Resource Strain: Not on file  Food Insecurity: Not on file  Transportation Needs: Not on file  Physical Activity: Not on file  Stress: Not on file  Social Connections: Not on file   SDOH:  SDOH Screenings   Alcohol Screen: Low Risk   . Last Alcohol Screening Score (AUDIT): 0  Depression (PHQ2-9): Medium Risk  . PHQ-2 Score: 27  Financial Resource Strain: Not on file  Food Insecurity: Not on file  Housing: Not on file  Physical Activity: Not on file  Social Connections: Not on file  Stress: Not on file  Tobacco Use: Medium Risk  . Smoking Tobacco Use: Former Smoker  . Smokeless Tobacco Use: Never Used  Transportation Needs: Not on file    Has this patient used any form of tobacco in the last 30 days? (Cigarettes, Smokeless Tobacco, Cigars, and/or Pipes) A prescription for an FDA-approved tobacco cessation medication was offered at discharge and the patient refused  Current Medications:  Current Facility-Administered Medications  Medication Dose Route Frequency Provider Last Rate Last Admin  . acetaminophen (TYLENOL) tablet 650 mg  650 mg Oral Q6H PRN [name] A, NP      . albuterol (VENTOLIN HFA) 108 (90 Base) MCG/ACT inhaler 1-2 puff  1-2 puff Inhalation Q6H PRN [name] A, NP      . alum & mag hydroxide-simeth (MAALOX/MYLANTA) 200-200-20 MG/5ML suspension 30 mL  30 mL Oral Q4H PRN [name] A, NP      . benzocaine (ORAJEL) 10 % mucosal gel   Mouth/Throat QID PRN [name] A, NP      . hydrOXYzine (ATARAX/VISTARIL) tablet 25 mg  25 mg Oral TID PRN Rozetta Nunnery, NP   25 mg at 02/03/20 2017  . ibuprofen (ADVIL) tablet 400 mg  400 mg Oral Q6H PRN [name] A, NP      . magnesium hydroxide (MILK OF MAGNESIA) suspension 30 mL   30 mL Oral Daily PRN [name] A, NP      . mometasone-formoterol (DULERA) 200-5 MCG/ACT inhaler 2 puff  2 puff Inhalation BID [name] A, NP   2 puff at 02/04/20 0931  . neomycin-bacitracin-polymyxin (NEOSPORIN) ointment packet 1 application  1 application Topical PRN Rozetta Nunnery, NP   1 application at [phone]  . ondansetron (ZOFRAN) tablet 4 mg  4 mg Oral Q8H PRN Nira Conn A, NP      . pantoprazole (PROTONIX) EC tablet 40 mg  40 mg Oral Daily Nira Conn A, NP   40 mg at 02/04/20 0931  . risperiDONE (RISPERDAL) tablet 3 mg  3 mg Oral QHS Nira Conn A, NP   3 mg at 02/03/20 2017  . sertraline (ZOLOFT) tablet 150 mg  150 mg Oral Daily Nira Conn A, NP   150 mg at 02/04/20 0931  . SUMAtriptan (IMITREX) tablet 50 mg  50 mg Oral BID PRN Nira Conn A, NP   50 mg at 02/04/20 1035  . traZODone (DESYREL) tablet 200 mg  200 mg Oral QHS Nira Conn A, NP   200 mg at 02/03/20 2017   Current Outpatient Medications  Medication Sig Dispense Refill  . albuterol (PROVENTIL HFA;VENTOLIN HFA) 108 (90 Base) MCG/ACT inhaler Inhale 1-2 puffs into the lungs every 6 (six) hours as needed for wheezing or shortness of breath.    . fluticasone-salmeterol (ADVAIR HFA) 115-21 MCG/ACT inhaler Inhale 2 puffs into the lungs 2 (two) times daily. (Patient taking differently: Inhale 2 puffs into the lungs 2 (two) times daily as needed (shortness of breath).) 1 Inhaler 5  . hydrOXYzine (ATARAX/VISTARIL) 25 MG tablet Take 1 tablet (25 mg total) by mouth 3 (three) times daily as needed for anxiety. (Patient taking differently: Take 25 mg by mouth 2 (two) times daily as needed for anxiety.) 75 tablet 0  . ibuprofen (ADVIL) 200 MG tablet Take 400 mg by mouth every 6 (six) hours as needed for cramping.    . ondansetron (ZOFRAN) 4 MG tablet Take 1 tablet (4 mg total) by mouth every 8 (eight) hours as needed for nausea or vomiting. 12 tablet 0  . pantoprazole (PROTONIX) 40 MG tablet Take 1 tablet (40 mg total)  by mouth daily. 30 tablet 0  . risperiDONE (RISPERDAL) 1 MG tablet Take 1 mg by mouth daily.    . risperiDONE (RISPERDAL) 3 MG tablet Take 1 tablet (3 mg total) by mouth 2 (two) times daily. For mood control (Patient taking differently: Take 3 mg by mouth at bedtime. For mood control) 60 tablet 0  . sertraline (ZOLOFT) 50 MG tablet Take 150 mg by mouth daily. 3 tablets daily = 150mg     . SUMAtriptan (IMITREX) 50 MG tablet Take 50 mg by mouth 2 (two) times daily as needed for migraine.    . traZODone (DESYREL) 100 MG tablet Take 2 tablets (200 mg total) by mouth at bedtime. For sleep 30 tablet 0    PTA Medications: (Not in a hospital admission)   Musculoskeletal  Strength & Muscle Tone: within normal limits Gait & Station: normal Patient leans: N/A  Psychiatric Specialty Exam  Presentation  General Appearance: Appropriate for Environment; Casual  Eye Contact:Fair  Speech:Clear and Coherent; Normal Rate  Speech Volume:Decreased  Handedness:Right   Mood and Affect  Mood:Depressed; Dysphoric  Affect:Congruent; Depressed   Thought Process  Thought Processes:Coherent  Descriptions of Associations:Intact  Orientation:Full (Time, Place and Person)  Thought Content:WDL  Hallucinations:Hallucinations: None  Ideas of Reference:None  Suicidal Thoughts:Suicidal Thoughts: Yes, Active SI Active Intent and/or Plan: With Intent; With Plan  Homicidal Thoughts:Homicidal Thoughts: Yes, Passive HI Passive Intent and/or Plan: Without Intent; Without Plan   Sensorium  Memory:Immediate Good; Recent Good; Remote Good  Judgment:Intact  Insight:Lacking   Executive Functions  Concentration:Good  Attention Span:Good  Recall:Good  Fund of Knowledge:Good  Language:Good   Psychomotor Activity  Psychomotor Activity:Psychomotor  Activity: Normal   Assets  Assets:Communication Skills; Desire for Improvement; Financial Resources/Insurance; Housing; Social Support   Sleep   Sleep:Sleep: Good   Physical Exam  Physical Exam Vitals and nursing note reviewed.  Constitutional:      Appearance: She is well-developed.  HENT:     Head: Normocephalic.  Eyes:     Pupils: Pupils are equal, round, and reactive to light.  Cardiovascular:     Rate and Rhythm: Normal rate.  Pulmonary:     Effort: Pulmonary effort is normal.  Musculoskeletal:        General: Normal range of motion.  Neurological:     Mental Status: She is alert and oriented to person, place, and time.    Review of Systems  Constitutional: Negative.   HENT: Negative.   Eyes: Negative.   Respiratory: Negative.   Cardiovascular: Negative.   Gastrointestinal: Negative.   Genitourinary: Negative.   Musculoskeletal: Negative.   Skin: Negative.   Neurological: Negative.   Endo/Heme/Allergies: Negative.   Psychiatric/Behavioral: Positive for depression and suicidal ideas.   Blood pressure (!) 112/56, pulse 79, temperature (!) 97.5 F (36.4 C), temperature source Oral, resp. rate 16, height 5\' 4"  (1.626 m), weight 212 lb (96.2 kg), SpO2 99 %. Body mass index is 36.39 kg/m.  Disposition: Discharge to [place], [place] 02/04/2020, 11:44 AM

## 2020-02-04 NOTE — H&P (Signed)
Psychiatric Admission Assessment Adult  Patient Identification: [name]  MRN:  [id number]  Date of Evaluation:  02/04/2020  Chief Complaint: Suicidal ideations triggered by not being with daughter.  Principal Diagnosis: Schizoaffective disorder ([place])  Diagnosis:  Principal Problem:   Schizoaffective disorder ([place])  History of Present Illness: (Per Md's admission SRA notes): Patient is seen and examined.  Patient is a 29 year old female with a past psychiatric history significant for schizoaffective disorder; bipolar type, borderline personality disorder, posttraumatic stress disorder, reportedly autism as well as chronic migraine headaches who presented to the [facility] emergency department on 01/30/2020.  The emergency room notes at that time stated that the patient had been under a great deal of stress because her deceased husband had proposed to her at Christmas time.  She stated he had killed himself.  She stated that her 59-year-old daughter is being taken care of by her husband's mother, and she is not allowed to see the child although she has "a court order to be able to".  The patient had been recently hospitalized at our facility from 01/17/2020 to 01/22/2020.  She was admitted at that time secondary to exacerbation of her schizoaffective disorder; bipolar type.  Her discharge medications included fluoxetine, risperidone and trazodone and was to follow-up with the [place] service.  Psychiatric consultation was obtained on 01/30/2020.  Evaluation at that time was because of chronic suicidal ideation.  The patient told the evaluating service that she did not want to be alone.  She reported that she is allowed her ex-boyfriend to move back into the home.  She stated at that time that the ACTT service discontinued her Zoloft and she was restarted on Prozac.  Also reportedly they had decreased her Risperdal because it was too sedating.  Her ACTT service was  contacted and on Christmas Day they were unable to transport the patient back to her residence.  Recommendations were for supportive psychotherapy about ongoing stressors and to be referred to the intensive outpatient program.  That was at 10:35 AM on the 25th.  She returned to the [facility] on 02/03/2020.  She reported suicidal ideation at that time to "put a knife right to my heart if you send me home again".  She stated that she wanted to go live in a family care home.  She stated that her family told her to "go kill her self".  She also had reported that she had cut her arm prior to her evaluation at the behavioral health urgent care center.  She was placed in the observation service there.  She continued to endorse suicidal ideation.  She stated that her boyfriend had decided to go back to [place] on the 29th, but then stated that the patient was not the reason why she was here the patient continued to report that she does not like being alone, and feels as though she needs to be admitted to the hospital to get stabilized.  She was reevaluated on 02/04/2020, and the patient continued to voice suicidal ideation.  It was decided to admit her to the hospital for evaluation and stabilization.  Her main request today is that she would like to go to this 1 particular group home in [place], because she has heard that it was nice.  Associated Signs/Symptoms:  Depression Symptoms:  depressed mood, insomnia, anxiety,  (Hypo) Manic Symptoms:  Labiality of Mood,  Anxiety Symptoms:  Excessive Worry,  Psychotic Symptoms:  Delusions, paranoia  PTSD Symptoms: "My husband, my brother & my father all committed suicide". Re-experiencing:  Flashbacks Intrusive Thoughts  Total Time spent with patient: 1 hour  Past Psychiatric History: Bipolar disorder.  Is the patient at risk to self? No.  Has the patient been a risk to self in the past 6 months? Yes.    Has the patient  been a risk to self within the distant past? Yes.    Is the patient a risk to others? No.  Has the patient been a risk to others in the past 6 months? No.  Has the patient been a risk to others within the distant past? No.   Prior Inpatient Therapy: Yes ([place], [place], [place], [place], [place]) Prior Outpatient Therapy: [facility]  Alcohol Screening: Denies any alcohol use.  Substance Abuse History in the last 12 months:  No.  Consequences of Substance Abuse: NA  Previous Psychotropic Medications: Risperdal, Sertralin haldol, Seroquel, Geodon etc.  Psychological Evaluations: 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    Chronic constipation    Depression    Drug-seeking behavior    Essential tremor    Headache    Overdose of acetaminophen 07/2017   and other meds   Personality disorder ([place])    Schizo-affective psychosis ([place])    Schizoaffective disorder, bipolar type ([place])    Seizures ([place])    Last seizure December 2017   Sleep apnea     Past Surgical History:  Procedure Laterality Date   MOUTH SURGERY  2009 or 2010   Family History:  Family History  Problem Relation Age of Onset   Mental illness Father    Asthma Father    PDD Brother    Seizures Brother    Family Psychiatric  History: Major depression: Father                                                      Bipolar disorder: Brother                                                       Completed suicide: Father & brother.  Tobacco Screening: Denies smoking cigarettes or use tobacco products.  Social History:  Social History   Substance and Sexual Activity  Alcohol Use No   Alcohol/week: 1.0 standard drink   Types: 1 Standard drinks or equivalent per week   Comment: denies at this time     Social History   Substance and Sexual Activity  Drug  Use No   Comment: History of cocaine use at age 73 for 4 months    Additional Social History:  Allergies:   Allergies  Allergen Reactions   Bee Venom Anaphylaxis   Coconut Flavor Anaphylaxis and Rash   Geodon [Ziprasidone Hcl] Other (See Comments)    Pt states that this medication causes paralysis of the mouth.     Haloperidol And Related Other (See Comments)    Pt states that this  medication causes paralysis of the mouth.     Lithium Other (See Comments)    Reaction:  Seizure-like activity.     Oxycodone Other (See Comments)    Reaction:  Hallucinations    Quetiapine Other (See Comments)    Pt states that this medication is too strong.    Seroquel [Quetiapine Fumarate] Other (See Comments)    Pt states that this medication is too strong.    Shellfish Allergy Anaphylaxis   Phenergan [Promethazine Hcl] Other (See Comments)    Reaction:  Chest pain     Prilosec [Omeprazole] Nausea And Vomiting   Sulfa Antibiotics Other (See Comments)    Chest pain    Tegretol [Carbamazepine] Nausea And Vomiting   Prozac [Fluoxetine] Other (See Comments)    Depression , Suicidal thoughts   Tape Other (See Comments)    Skin tears, can only tolerate paper tape.   Lab Results:  Results for orders placed or performed during the hospital encounter of 02/03/20 (from the past 48 hour(s))  POCT Urine Drug Screen - ([facility])     Status: None (Preliminary result)   Collection Time: 02/03/20  2:56 AM  Result Value Ref Range   POC Amphetamine UR None Detected NONE DETECTED (Cut Off Level 1000 ng/mL)   POC Secobarbital (BAR) None Detected NONE DETECTED (Cut Off Level 300 ng/mL)   POC Buprenorphine (BUP) None Detected NONE DETECTED (Cut Off Level 10 ng/mL)   POC Oxazepam (BZO) None Detected NONE DETECTED (Cut Off Level 300 ng/mL)   POC Cocaine UR None Detected NONE DETECTED (Cut Off Level 300 ng/mL)   POC Methamphetamine UR None Detected NONE DETECTED (Cut Off Level 1000 ng/mL)   POC  Morphine None Detected NONE DETECTED (Cut Off Level 300 ng/mL)   POC Oxycodone UR None Detected NONE DETECTED (Cut Off Level 100 ng/mL)   POC Methadone UR None Detected NONE DETECTED (Cut Off Level 300 ng/mL)   POC Marijuana UR None Detected NONE DETECTED (Cut Off Level 50 ng/mL)  Resp Panel by RT-PCR (Flu A&B, Covid) Urine, Clean Catch     Status: None   Collection Time: 02/03/20  3:05 AM   Specimen: Urine, Clean Catch; Nasopharyngeal(NP) swabs in vial transport medium  Result Value Ref Range   SARS Coronavirus 2 by RT PCR NEGATIVE NEGATIVE    Comment: (NOTE) SARS-CoV-2 target nucleic acids are NOT DETECTED.  The SARS-CoV-2 RNA is generally detectable in upper respiratory specimens during the acute phase of infection. The lowest concentration of SARS-CoV-2 viral copies this assay can detect is 138 copies/mL. A negative result does not preclude SARS-Cov-2 infection and should not be used as the sole basis for treatment or other patient management decisions. A negative result may occur with  improper specimen collection/handling, submission of specimen other than nasopharyngeal swab, presence of viral mutation(s) within the areas targeted by this assay, and inadequate number of viral copies(<138 copies/mL). A negative result must be combined with clinical observations, patient history, and epidemiological information. The expected result is Negative.  Fact Sheet for Patients:  BloggerCourse.com  Fact Sheet for Healthcare Providers:  SeriousBroker.it  This test is no t yet approved or cleared by the [place] FDA and  has been authorized for detection and/or diagnosis of SARS-CoV-2 by FDA under an Emergency Use Authorization (EUA). This EUA will remain  in effect (meaning this test can be used) for the duration of the COVID-19 declaration under Section 564(b)(1) of the Act, 21 U.S.C.section 360bbb-3(b)(1), unless the authorization  is terminated  or revoked sooner.       Influenza A by PCR NEGATIVE NEGATIVE   Influenza B by PCR NEGATIVE NEGATIVE    Comment: (NOTE) The Xpert Xpress SARS-CoV-2/FLU/RSV plus assay is intended as an aid in the diagnosis of influenza from Nasopharyngeal swab specimens and should not be used as a sole basis for treatment. Nasal washings and aspirates are unacceptable for Xpert Xpress SARS-CoV-2/FLU/RSV testing.  Fact Sheet for Patients: EntrepreneurPulse.com.au  Fact Sheet for Healthcare Providers: IncredibleEmployment.be  This test is not yet approved or cleared by the [place] FDA and has been authorized for detection and/or diagnosis of SARS-CoV-2 by FDA under an Emergency Use Authorization (EUA). This EUA will remain in effect (meaning this test can be used) for the duration of the COVID-19 declaration under Section 564(b)(1) of the Act, 21 U.S.C. section 360bbb-3(b)(1), unless the authorization is terminated or revoked.  Performed at [facility], [place] [address], [place], [place] [address]   Pregnancy, urine POC     Status: None   Collection Time: 02/03/20  3:06 AM  Result Value Ref Range   Preg Test, Ur NEGATIVE NEGATIVE    Comment:        THE SENSITIVITY OF THIS METHODOLOGY IS >24 mIU/mL   CBC with Differential/Platelet     Status: None   Collection Time: 02/03/20  3:20 AM  Result Value Ref Range   WBC 7.9 4.0 - 10.5 K/uL   RBC 5.09 3.87 - 5.11 MIL/uL   Hemoglobin 13.4 12.0 - 15.0 g/dL   HCT 41.3 36.0 - 46.0 %   MCV 81.1 80.0 - 100.0 fL   MCH 26.3 26.0 - 34.0 pg   MCHC 32.4 30.0 - 36.0 g/dL   RDW 13.2 11.5 - 15.5 %   Platelets 304 150 - 400 K/uL   nRBC 0.0 0.0 - 0.2 %   Neutrophils Relative % 59 %   Neutro Abs 4.7 1.7 - 7.7 K/uL   Lymphocytes Relative 34 %   Lymphs Abs 2.7 0.7 - 4.0 K/uL   Monocytes Relative 5 %   Monocytes Absolute 0.4 0.1 - 1.0 K/uL   Eosinophils Relative 1 %   Eosinophils Absolute 0.1  0.0 - 0.5 K/uL   Basophils Relative 1 %   Basophils Absolute 0.0 0.0 - 0.1 K/uL   Immature Granulocytes 0 %   Abs Immature Granulocytes 0.03 0.00 - 0.07 K/uL    Comment: Performed at [facility], [address], [place], [place] [address]  Comprehensive metabolic panel     Status: Abnormal   Collection Time: 02/03/20  3:20 AM  Result Value Ref Range   Sodium 138 135 - 145 mmol/L   Potassium 3.4 (L) 3.5 - 5.1 mmol/L   Chloride 102 98 - 111 mmol/L   CO2 22 22 - 32 mmol/L   Glucose, Bld 86 70 - 99 mg/dL    Comment: Glucose reference range applies only to samples taken after fasting for at least 8 hours.   BUN 9 6 - 20 mg/dL   Creatinine, Ser 0.69 0.44 - 1.00 mg/dL   Calcium 9.7 8.9 - 10.3 mg/dL   Total Protein 7.8 6.5 - 8.1 g/dL   Albumin 4.4 3.5 - 5.0 g/dL   AST 16 15 - 41 U/L   ALT 17 0 - 44 U/L   Alkaline Phosphatase 73 38 - 126 U/L   Total Bilirubin 0.4 0.3 - 1.2 mg/dL   GFR, Estimated >60 >60 mL/min    Comment: (  NOTE) Calculated using the CKD-EPI Creatinine Equation (2021)    Anion gap 14 5 - 15    Comment: Performed at [facility], [place] [address], [place], [place] [address]   Blood Alcohol level:  Lab Results  Component Value Date   [facility] <10 01/30/2020   ETH <10 [id number]   Metabolic Disorder Labs:  Lab Results  Component Value Date   HGBA1C 5.0 07/02/2019   MPG 96.8 07/02/2019   MPG 91.06 03/06/2019   Lab Results  Component Value Date   PROLACTIN 129.0 (H) 01/19/2020   PROLACTIN 128.0 (H) 01/18/2020   Lab Results  Component Value Date   CHOL 173 01/18/2020   TRIG 159 (H) 01/18/2020   HDL 49 01/18/2020   CHOLHDL 3.5 01/18/2020   VLDL 32 01/18/2020   LDLCALC 92 01/18/2020   LDLCALC 122 (H) 07/02/2019   Current Medications: Current Facility-Administered Medications  Medication Dose Route Frequency Provider Last Rate Last Admin   acetaminophen (TYLENOL) tablet 650 mg  650 mg Oral Q6H PRN Money, Lowry Ram, FNP       alum & mag  hydroxide-simeth (MAALOX/MYLANTA) 200-200-20 MG/5ML suspension 30 mL  30 mL Oral Q4H PRN Money, Darnelle Maffucci B, FNP       hydrOXYzine (ATARAX/VISTARIL) tablet 25 mg  25 mg Oral TID PRN Money, Lowry Ram, FNP       magnesium hydroxide (MILK OF MAGNESIA) suspension 30 mL  30 mL Oral Daily PRN Money, Lowry Ram, FNP       mometasone-formoterol (DULERA) 200-5 MCG/ACT inhaler 2 puff  2 puff Inhalation BID Money, Lowry Ram, FNP       ondansetron (ZOFRAN) tablet 4 mg  4 mg Oral Q8H PRN Sharma Covert, MD       ondansetron (ZOFRAN-ODT) disintegrating tablet 4 mg  4 mg Oral Q8H PRN Money, Lowry Ram, FNP       [START ON 02/05/2020] pantoprazole (PROTONIX) EC tablet 40 mg  40 mg Oral Daily Money, Lowry Ram, FNP       risperiDONE (RISPERDAL M-TABS) disintegrating tablet 2 mg  2 mg Oral Daily Sharma Covert, MD       risperiDONE (RISPERDAL) tablet 3 mg  3 mg Oral QHS Money, Lowry Ram, [place]       [START ON 02/05/2020] sertraline (ZOLOFT) tablet 150 mg  150 mg Oral Daily Money, Lowry Ram, FNP       SUMAtriptan (IMITREX) tablet 50 mg  50 mg Oral BID PRN Money, Lowry Ram, FNP       traZODone (DESYREL) tablet 200 mg  200 mg Oral QHS Money, Lowry Ram, FNP       PTA Medications: Medications Prior to Admission  Medication Sig Dispense Refill Last Dose   albuterol (PROVENTIL HFA;VENTOLIN HFA) 108 (90 Base) MCG/ACT inhaler Inhale 1-2 puffs into the lungs every 6 (six) hours as needed for wheezing or shortness of breath.      fluticasone-salmeterol (ADVAIR HFA) 115-21 MCG/ACT inhaler Inhale 2 puffs into the lungs 2 (two) times daily. (Patient taking differently: Inhale 2 puffs into the lungs 2 (two) times daily as needed (shortness of breath).) 1 Inhaler 5    hydrOXYzine (ATARAX/VISTARIL) 25 MG tablet Take 1 tablet (25 mg total) by mouth 3 (three) times daily as needed for anxiety. (Patient taking differently: Take 25 mg by mouth 2 (two) times daily as needed for anxiety.) 75 tablet 0    ibuprofen (ADVIL) 200 MG  tablet Take 400 mg by mouth every 6 (six) hours  as needed for cramping.      ondansetron (ZOFRAN) 4 MG tablet Take 1 tablet (4 mg total) by mouth every 8 (eight) hours as needed for nausea or vomiting. 12 tablet 0    pantoprazole (PROTONIX) 40 MG tablet Take 1 tablet (40 mg total) by mouth daily. 30 tablet 0    risperiDONE (RISPERDAL) 3 MG tablet Take 1 tablet (3 mg total) by mouth 2 (two) times daily. For mood control (Patient taking differently: Take 3 mg by mouth at bedtime. For mood control) 60 tablet 0    sertraline (ZOLOFT) 50 MG tablet Take 150 mg by mouth daily. 3 tablets daily = 150mg       SUMAtriptan (IMITREX) 50 MG tablet Take 50 mg by mouth 2 (two) times daily as needed for migraine.      traZODone (DESYREL) 100 MG tablet Take 2 tablets (200 mg total) by mouth at bedtime. For sleep 30 tablet 0    Musculoskeletal: Strength & Muscle Tone: within normal limits Gait & Station: normal Patient leans: N/A  Psychiatric Specialty Exam: Physical Exam Vitals and nursing note reviewed.  Constitutional:      Appearance: She is well-developed.  HENT:     Head: Normocephalic.     Nose: Nose normal.     Mouth/Throat:     Pharynx: Oropharynx is clear.  Eyes:     Pupils: Pupils are equal, round, and reactive to light.  Cardiovascular:     Rate and Rhythm: Normal rate.  Pulmonary:     Effort: Pulmonary effort is normal.  Abdominal:     Palpations: Abdomen is soft.  Genitourinary:    Comments: Deferred Musculoskeletal:        General: Normal range of motion.     Cervical back: Normal range of motion.  Skin:    General: Skin is warm and dry.  Neurological:     General: No focal deficit present.     Mental Status: She is alert and oriented to person, place, and time.     Review of Systems  Constitutional: Negative for chills, diaphoresis and fever.  HENT: Negative for congestion, rhinorrhea, sneezing and sore throat.   Eyes: Negative for discharge.  Respiratory: Negative  for cough, choking and shortness of breath.   Cardiovascular: Negative for chest pain and palpitations.  Gastrointestinal: Negative for abdominal pain, diarrhea, nausea and vomiting.  Endocrine: Negative for cold intolerance.  Genitourinary: Negative for difficulty urinating.  Musculoskeletal: Negative for arthralgias and myalgias.  Skin: Negative for color change.  Allergic/Immunologic: Positive for food allergies ( Coconut, Milk). Negative for immunocompromised state.       Allergies:  Bee Venom  Coconut  Geodon Haloperidol  Lithium  Quetiapine  UpdatesDeletion Reason: Sulfa Antibiotics Sulfa  Prozac  Tape   Milk- Oxycodone  Phenergan Omeprazole Tegretol Percocet  Thorazine.  Neurological: Negative for dizziness, tremors, seizures, syncope, facial asymmetry, speech difficulty, weakness, light-headedness, numbness and headaches (Hx. Migraine  ).  Hematological: Negative for adenopathy. Does not bruise/bleed easily.  Psychiatric/Behavioral: Positive for decreased concentration, dysphoric mood, hallucinations, self-injury and sleep disturbance. Negative for agitation, behavioral problems, confusion and suicidal ideas. The patient is nervous/anxious and is hyperactive.     Blood pressure 118/66, pulse 99, temperature 98.2 F (36.8 C), temperature source Oral, resp. rate 16, height 5\' 2"  (1.575 m), weight 94.8 kg, SpO2 100 %.Body mass index is 38.23 kg/m.  General Appearance: Casual  Eye Contact:  Minimal  Speech:  Pressured  Volume:  Increased  Mood:  Anxious, Dysphoric  and Irritable  Affect: Labile  Thought Process:  Coherent and Descriptions of Associations: Circumstantial  Orientation:  Full (Time, Place, and Person)  Thought Content:  Delusions, Paranoid ideations & Rumination  Suicidal Thoughts: Yes, without plan or intent.  Homicidal Thoughts:  No  Memory:  Immediate;   Poor Recent;   Poor Remote;   Poor  Judgement:  Impaired  Insight:  Lacking  Psychomotor  Activity:  Increased  Concentration:  Concentration: Poor and Attention Span: Poor  Recall: Poor  Fund of Knowledge: Poor  Language: Fair  Akathisia:  Negative  Handed:  Right  AIMS (if indicated):     Assets:  Desire for Improvement Resilience  ADL's:  Intact  Cognition: Impaired, moderate  Sleep: New admit.      Treatment Plan Summary: Daily contact with patient to assess and evaluate symptoms and progress in treatment and Medication management.  Treatment Plan/Recommendations:  1. Admit for crisis management and stabilization, estimated length of stay 3-5 days.    2. Medication management to reduce current symptoms to base line and improve the patient's overall level of functioning: See MAR, Md's SRA & treatment plan.   Observation Level/Precautions:  15 minute checks  Laboratory:  Per ED, current lab findings reviewed  Psychotherapy: Group sessions   Medications: See [facility]    Consultations: As needed   Discharge Concerns: safety, mood stability.    Estimated LOS: 2-4 days  Other: Admit to the [facility]    Physician Treatment Plan for Primary Diagnosis: Schizoaffective disorder ([place])  Long Term Goal(s): Improvement in symptoms so as ready for discharge  Short Term Goals: Ability to verbalize feelings will improve and Ability to demonstrate self-control will improve  Physician Treatment Plan for Secondary Diagnosis: Principal Problem:   Schizoaffective disorder ([place])  Long Term Goal(s): Improvement in symptoms so as ready for discharge  Short Term Goals: Ability to identify and develop effective coping behaviors will improve and Compliance with prescribed medications will improve  I certify that inpatient services furnished can reasonably be expected to improve the patient's condition.    Lindell Spar, NP, PMHNP, FNP-BC. 12/30/20213:42 PM

## 2020-02-04 NOTE — Progress Notes (Signed)
   02/04/20 2113  COVID-19 Daily Checkoff  Have you had a fever (temp > 37.80C/100F)  in the past 24 hours?  No  COVID-19 EXPOSURE  Have you traveled outside the state in the past 14 days? No  Have you been in contact with someone with a confirmed diagnosis of COVID-19 or PUI in the past 14 days without wearing appropriate PPE? No  Have you been living in the same home as a person with confirmed diagnosis of COVID-19 or a PUI (household contact)? No  Have you been diagnosed with COVID-19? No

## 2020-02-04 NOTE — ED Notes (Signed)
Pt sleeping@this time. Breathing even and unlabored. Will continue to monitor for safety 

## 2020-02-04 NOTE — ED Notes (Signed)
Pt stating that she would either stab herself, OD on pills, or "smash myself in head with a hammer." and that she would "fuck up anyone that got in my way." will contract for safety while in facility

## 2020-02-04 NOTE — ED Notes (Signed)
Called safe transport services to transport pt to [facility]. Pt agreeable. Safety maintained.

## 2020-02-04 NOTE — Progress Notes (Addendum)
Pt admitted at [facility] because of SI and HI with a plan to "stab myself and everyone who gets in my way." Pt said triggers for increased depression, SI and HI were "because my Prozac made me worse, the Prozac caused my suicidal thoughts to spiral out of control."  Pt has a self inflicted abrasion on her left wrist from scraping her wrist with a razor blade.  Pt also said triggers were "being alone, and not having my daughter around me."    Pt has an ACT Team in place.  Pt complains that the ACT Team "is sluggish about giving me my meds, so I went back to the ER because I didn't have the right medications for me."  Pt said she hears voices telling pt to kill herself.  Pt said goals of this hospitalization is to "attend groups and improve my coping skills."  Pt also reported that her goal was to improve pt's mood, thoughts and outlook on life.  Pt currently has SI and HI and is hearing voices at time of [facility] assessment.  Pt signed voluntary consent paperwork. RN oriented pt to unit, room and staff.  RN initiated q 15 min safety checks.

## 2020-02-04 NOTE — ED Notes (Signed)
Pt resting in bed awake. Continues to endorse SI and cannot contract for safety. POC is for pt to transfer to [facility] at 2pm. Report called. Will continue to monitor for safety.

## 2020-02-04 NOTE — ED Notes (Signed)
Pt given meal

## 2020-02-04 NOTE — Tx Team (Signed)
Initial Treatment Plan 02/04/2020 4:37 PM Gaetana Kawahara SXJ:[id number]    PATIENT STRESSORS: Financial difficulties Marital or family conflict Medication change or noncompliance   PATIENT STRENGTHS: Average or above average intelligence Capable of independent living Communication skills Religious Affiliation   PATIENT IDENTIFIED PROBLEMS: "My prozac makes my depression worse."  "I need to learn better coping skills"  "I miss my daughter."                 DISCHARGE CRITERIA:  Ability to meet basic life and health needs Adequate post-discharge living arrangements Improved stabilization in mood, thinking, and/or behavior  PRELIMINARY DISCHARGE PLAN: Outpatient therapy  PATIENT/FAMILY INVOLVEMENT: This treatment plan has been presented to and reviewed with the patient, [name].  The patient has been given the opportunity to ask questions and make suggestions.  Garnette Scheuermann, RN 02/04/2020, 4:37 PM

## 2020-02-04 NOTE — Discharge Instructions (Addendum)
Discharge to [facility] 

## 2020-02-04 NOTE — ED Notes (Signed)
Pt given snack. 

## 2020-02-04 NOTE — [facility] Suicide Risk Assessment (Signed)
[facility] Admission Suicide Risk Assessment   Nursing information obtained from:    Demographic factors:    Current Mental Status:    Loss Factors:    Historical Factors:    Risk Reduction Factors:     Total Time spent with patient: 30 minutes Principal Problem: <principal problem not specified> Diagnosis:  Active Problems:   Schizoaffective disorder (HCC)  Subjective Data: Patient is seen and examined.  Patient is a 29 year old female with a past psychiatric history significant for schizoaffective disorder; bipolar type, borderline personality disorder, posttraumatic stress disorder, reportedly autism as well as chronic migraine headaches who presented to the [facility] emergency department on 01/30/2020.  The emergency room notes at that time stated that the patient had been under a great deal of stress because her deceased husband had proposed to her at Christmas time.  She stated he had killed himself.  She stated that her 21-year-old daughter is being taken care of by her husband's mother, and she is not allowed to see the child although she has "a court order to be able to".  The patient had been recently hospitalized at our facility from 01/17/2020 to 01/22/2020.  She was admitted at that time secondary to exacerbation of her schizoaffective disorder; bipolar type.  Her discharge medications included fluoxetine, risperidone and trazodone and was to follow-up with the [facility] service.  Psychiatric consultation was obtained on 01/30/2020.  Evaluation at that time was because of chronic suicidal ideation.  The patient told the evaluating service that she did not want to be alone.  She reported that she is allowed her ex-boyfriend to move back into the home.  She stated at that time that the ACTT service discontinued her Zoloft and she was restarted on Prozac.  Also reportedly they had decreased her Risperdal because it was too sedating.  Her ACTT service was contacted and on  Christmas Day they were unable to transport the patient back to her residence.  Recommendations were for supportive psychotherapy about ongoing stressors and to be referred to the intensive outpatient program.  That was at 10:35 AM on the 25th.  She returned to the [facility] on 02/03/2020.  She reported suicidal ideation at that time to "put a knife right to my heart if you send me home again".  She stated that she wanted to go live in a family care home.  She stated that her family told her to "go kill her self".  She also had reported that she had cut her arm prior to her evaluation at the behavioral health urgent care center.  She was placed in the observation service there.  She continued to endorse suicidal ideation.  She stated that her boyfriend had decided to go back to [place] on the 29th, but then stated that the patient was not the reason why she was here the patient continued to report that she does not like being alone, and feels as though she needs to be admitted to the hospital to get stabilized.  She was reevaluated on 02/04/2020, and the patient continued to voice suicidal ideation.  It was decided to admit her to the hospital for evaluation and stabilization.  Her main request today is that she would like to go to this 1 particular group home in [place], because she has heard that it was nice.  Continued Clinical Symptoms:    The "Alcohol Use Disorders Identification Test", Guidelines for Use in Primary Care, Second Edition.  World Pharmacologist [facility]). Score between 0-7:  no or low risk or alcohol related problems. Score between 8-15:  moderate risk of alcohol related problems. Score between 16-19:  high risk of alcohol related problems. Score 20 or above:  warrants further diagnostic evaluation for alcohol dependence and treatment.   CLINICAL FACTORS:   Bipolar Disorder:   Mixed State Personality Disorders:   Cluster B More than one  psychiatric diagnosis Previous Psychiatric Diagnoses and Treatments   Musculoskeletal: Strength & Muscle Tone: within normal limits Gait & Station: normal Patient leans: N/A  Psychiatric Specialty Exam: Physical Exam Vitals and nursing note reviewed.  Constitutional:      Appearance: She is obese.  HENT:     Head: Normocephalic and atraumatic.  Pulmonary:     Effort: Pulmonary effort is normal.  Neurological:     General: No focal deficit present.     Mental Status: She is alert.     Review of Systems  Blood pressure 118/66, pulse 99, temperature 98.2 F (36.8 C), temperature source Oral, resp. rate 16, height 5\' 2"  (1.575 m), weight 94.8 kg, SpO2 100 %.Body mass index is 38.23 kg/m.  General Appearance: Casual  Eye Contact:  Minimal  Speech:  Pressured  Volume:  Increased  Mood:  Anxious, Dysphoric and Irritable  Affect:  Labile  Thought Process:  Goal Directed and Descriptions of Associations: Circumstantial  Orientation:  Full (Time, Place, and Person)  Thought Content:  Delusions, Paranoid Ideation and Rumination  Suicidal Thoughts:  Yes.  without intent/plan  Homicidal Thoughts:  No  Memory:  Immediate;   Poor Recent;   Poor Remote;   Poor  Judgement:  Impaired  Insight:  Lacking  Psychomotor Activity:  Increased  Concentration:  Concentration: Poor and Attention Span: Poor  Recall:  Poor  Fund of Knowledge:  Poor  Language:  Fair  Akathisia:  Negative  Handed:  Right  AIMS (if indicated):     Assets:  Desire for Improvement Resilience  ADL's:  Intact  Cognition:  Impaired,  Moderate  Sleep:         COGNITIVE FEATURES THAT CONTRIBUTE TO RISK:  Thought constriction (tunnel vision)    SUICIDE RISK:   Mild:  Suicidal ideation of limited frequency, intensity, duration, and specificity.  There are no identifiable plans, no associated intent, mild dysphoria and related symptoms, good self-control (both objective and subjective assessment), few other risk  factors, and identifiable protective factors, including available and accessible social support.  PLAN OF CARE: Patient is seen and examined.  Patient is a 29 year old female with the above-stated past psychiatric history who was transferred to our facility for continued treatment of her chronic suicidality.  She will be admitted to the hospital.  She will be integrated in the milieu.  She will be encouraged to attend groups.  On examination today she is pressured.  Despite being on Risperdal at 3 mg p.o. nightly she is on Zoloft 150 mg p.o. daily.  She talks about how she was on the Zoloft and it made her suicidal but she been switched to Prozac and that worked better.  I am just not sure given the switch every time she talks about this with someone.  Her discharge medications from the 01/17/2020 hospitalization did not include any mood stability medications outside of the Risperdal.  Review of her multiple allergies include allergies to Geodon, Haldol, Seroquel, lithium, Prozac, Prilosec, Tegretol.  She was hospitalized here in June of this year and had been discharged  on Risperdal 2 mg p.o. daily and 3 mg p.o. nightly she was also on sertraline at that time.  I am going to go on and increase the Risperdal to 2 mg p.o. daily along with the 3 mg p.o. nightly as already been ordered.  We will see if that slows her down at all.  Review of her admission laboratories included a mildly low potassium at 3.4.  The rest of her electrolytes including creatinine and liver function enzymes were normal.  Her lipid panel was normal.  CBC was normal.  Acetaminophen was less than 10, salicylate less than 7.  Her prolactin on 12/14 was 129.  Unfortunately given her multiple allergies there is very little with that we are able to do with that.  Her urine pregnancy test was negative.  TSH was 2.470.  Drug screen was completely negative.  Her last EKG was on 12/12, and we will go on and repeat that.  Her vital signs are stable, she  is afebrile.   I certify that inpatient services furnished can reasonably be expected to improve the patient's condition.   Antonieta Pert, MD 02/04/2020, 3:24 PM

## 2020-02-05 NOTE — [facility] Counselor (Signed)
CSW attempted to contact [facility] to inform them that [name] is in the hospital.  CSW left a voicemail due to the holiday.  CSW will attempt to contact again on 02/08/19.

## 2020-02-05 NOTE — [facility] Suicide Risk Assessment (Signed)
[facility] INPATIENT:  Family/Significant Other Suicide Prevention Education  Suicide Prevention Education:  Patient Refusal for Family/Significant Other Suicide Prevention Education: The patient [name] has refused to provide written consent for family/significant other to be provided Family/Significant Other Suicide Prevention Education during admission and/or prior to discharge.  Physician notified.  CSW completed SPE with patient.  A suicide prevention pamphlet will be placed in the patient's chart.  [name] 02/05/2020, 2:07 PM

## 2020-02-05 NOTE — [facility] Group Notes (Signed)
Adult Psychoeducational Group Note  Date:  02/05/2020 Time:  9:27 PM  Group Topic/Focus:  Wrap-Up Group:   The focus of this group is to help patients review their daily goal of treatment and discuss progress on daily workbooks.  Participation Level:  Active  Participation Quality:  Appropriate  Affect:  Appropriate  Cognitive:  Appropriate  Insight: Good  Engagement in Group:  Engaged  Modes of Intervention:  Discussion  Additional Comments:  Pt rated her day a 6 because she was told that if she "act out again" that she would not be going home.  Pt goals were met to speak with her Education officer, museum.  Marcus Groll A 02/05/2020, 9:27 PM

## 2020-02-05 NOTE — [facility] Group Notes (Signed)
[facility] LCSW Group Therapy  02/05/2020 2:40 PM  Type of Therapy:  Coping Skills and Discharge Planning  Participation Level:  Active  Participation Quality:  Appropriate  Affect:  Anxious  Cognitive:  Appropriate  Insight:  Developing/Improving  Engagement in Therapy:  Developing/Improving  Modes of Intervention:  Activity and Discussion  Summary of Progress/Problems: [name] states that one positive thing about her is that she is strong.  [name] states that her support system is her ex-boyfriend and her church.  [name] states that her coping skills are to take a shower and listen to music.  [name] states that one way she can begin to change things is to take her medications regularly.  [name] states that her biggest motivator is her daughter.  [name] accepted the handouts that were provided during group.   Metro Kung Seith Aikey 02/05/2020, 2:40 PM

## 2020-02-05 NOTE — Progress Notes (Signed)
Psychoeducational Group Note  Date:  02/05/2020 Time:  0258  Group Topic/Focus:  Wrap-Up Group:   The focus of this group is to help patients review their daily goal of treatment and discuss progress on daily workbooks.  Participation Level: Did Not Attend  Participation Quality:  Not Applicable  Affect:  Not Applicable  Cognitive:  Not Applicable  Insight:  Not Applicable  Engagement in Group: Not Applicable  Additional Comments:  The patient did not attend group last evening.   Sherica Paternostro S 02/05/2020, 2:58 AM

## 2020-02-05 NOTE — Progress Notes (Signed)
   02/05/20 2152  COVID-19 Daily Checkoff  Have you had a fever (temp > 37.80C/100F)  in the past 24 hours?  No  If you have had runny nose, nasal congestion, sneezing in the past 24 hours, has it worsened? No  COVID-19 EXPOSURE  Have you traveled outside the state in the past 14 days? No  Have you been in contact with someone with a confirmed diagnosis of COVID-19 or PUI in the past 14 days without wearing appropriate PPE? No  Have you been living in the same home as a person with confirmed diagnosis of COVID-19 or a PUI (household contact)? No  Have you been diagnosed with COVID-19? No

## 2020-02-05 NOTE — Tx Team (Signed)
Interdisciplinary Treatment and Diagnostic Plan Update  02/05/2020 Time of Session: 9:25am  [name] MRN: [id number]  Principal Diagnosis: Schizoaffective disorder [facility])  Secondary Diagnoses: Principal Problem:   Schizoaffective disorder ([place])   Current Medications:  Current Facility-Administered Medications  Medication Dose Route Frequency Provider Last Rate Last Admin  . acetaminophen (TYLENOL) tablet 650 mg  650 mg Oral Q6H PRN Money, Lowry Ram, FNP      . alum & mag hydroxide-simeth (MAALOX/MYLANTA) 200-200-20 MG/5ML suspension 30 mL  30 mL Oral Q4H PRN Money, Lowry Ram, FNP      . hydrOXYzine (ATARAX/VISTARIL) tablet 25 mg  25 mg Oral TID PRN Money, Lowry Ram, FNP   25 mg at 02/05/20 1043  . magnesium hydroxide (MILK OF MAGNESIA) suspension 30 mL  30 mL Oral Daily PRN Money, Lowry Ram, FNP      . mometasone-formoterol (DULERA) 200-5 MCG/ACT inhaler 2 puff  2 puff Inhalation BID Money, Lowry Ram, FNP   2 puff at 02/05/20 0751  . ondansetron (ZOFRAN) tablet 4 mg  4 mg Oral Q8H PRN Sharma Covert, MD      . pantoprazole (PROTONIX) EC tablet 40 mg  40 mg Oral Daily Money, Lowry Ram, FNP   40 mg at 02/05/20 0750  . risperiDONE (RISPERDAL M-TABS) disintegrating tablet 2 mg  2 mg Oral Daily Sharma Covert, MD   2 mg at 02/05/20 0750  . risperiDONE (RISPERDAL) tablet 3 mg  3 mg Oral QHS Money, Travis B, FNP   3 mg at 02/04/20 2113  . sertraline (ZOLOFT) tablet 150 mg  150 mg Oral Daily Money, Lowry Ram, FNP   150 mg at 02/05/20 0750  . SUMAtriptan (IMITREX) tablet 50 mg  50 mg Oral BID PRN Money, Lowry Ram, FNP   50 mg at 02/05/20 0603  . traZODone (DESYREL) tablet 200 mg  200 mg Oral QHS Money, Lowry Ram, FNP   200 mg at 02/04/20 2113   PTA Medications: Medications Prior to Admission  Medication Sig Dispense Refill Last Dose  . albuterol (PROVENTIL HFA;VENTOLIN HFA) 108 (90 Base) MCG/ACT inhaler Inhale 1-2 puffs into the lungs every 6 (six) hours as needed for wheezing or  shortness of breath.     . fluticasone-salmeterol (ADVAIR HFA) 115-21 MCG/ACT inhaler Inhale 2 puffs into the lungs 2 (two) times daily. (Patient taking differently: Inhale 2 puffs into the lungs 2 (two) times daily as needed (shortness of breath).) 1 Inhaler 5   . hydrOXYzine (ATARAX/VISTARIL) 25 MG tablet Take 1 tablet (25 mg total) by mouth 3 (three) times daily as needed for anxiety. (Patient taking differently: Take 25 mg by mouth 2 (two) times daily as needed for anxiety.) 75 tablet 0   . ibuprofen (ADVIL) 200 MG tablet Take 400 mg by mouth every 6 (six) hours as needed for cramping.     . ondansetron (ZOFRAN) 4 MG tablet Take 1 tablet (4 mg total) by mouth every 8 (eight) hours as needed for nausea or vomiting. 12 tablet 0   . pantoprazole (PROTONIX) 40 MG tablet Take 1 tablet (40 mg total) by mouth daily. 30 tablet 0   . risperiDONE (RISPERDAL) 3 MG tablet Take 1 tablet (3 mg total) by mouth 2 (two) times daily. For mood control (Patient taking differently: Take 3 mg by mouth at bedtime. For mood control) 60 tablet 0   . sertraline (ZOLOFT) 50 MG tablet Take 150 mg by mouth daily. 3 tablets daily = 127m     .  SUMAtriptan (IMITREX) 50 MG tablet Take 50 mg by mouth 2 (two) times daily as needed for migraine.     . traZODone (DESYREL) 100 MG tablet Take 2 tablets (200 mg total) by mouth at bedtime. For sleep 30 tablet 0     Patient Stressors: Financial difficulties Marital or family conflict Medication change or noncompliance  Patient Strengths: Average or above average intelligence Capable of independent living Communication skills Religious Affiliation  Treatment Modalities: Medication Management, Group therapy, Case management,  1 to 1 session with clinician, Psychoeducation, Recreational therapy.   Physician Treatment Plan for Primary Diagnosis: Schizoaffective disorder ([place]) Long Term Goal(s): Improvement in symptoms so as ready for discharge Improvement in symptoms so as ready  for discharge   Short Term Goals: Ability to verbalize feelings will improve Ability to demonstrate self-control will improve Ability to identify and develop effective coping behaviors will improve Compliance with prescribed medications will improve  Medication Management: Evaluate patient's response, side effects, and tolerance of medication regimen.  Therapeutic Interventions: 1 to 1 sessions, Unit Group sessions and Medication administration.  Evaluation of Outcomes: Not Met  Physician Treatment Plan for Secondary Diagnosis: Principal Problem:   Schizoaffective disorder ([place])  Long Term Goal(s): Improvement in symptoms so as ready for discharge Improvement in symptoms so as ready for discharge   Short Term Goals: Ability to verbalize feelings will improve Ability to demonstrate self-control will improve Ability to identify and develop effective coping behaviors will improve Compliance with prescribed medications will improve     Medication Management: Evaluate patient's response, side effects, and tolerance of medication regimen.  Therapeutic Interventions: 1 to 1 sessions, Unit Group sessions and Medication administration.  Evaluation of Outcomes: Not Met   RN Treatment Plan for Primary Diagnosis: Schizoaffective disorder ([place]) Long Term Goal(s): Knowledge of disease and therapeutic regimen to maintain health will improve  Short Term Goals: Ability to remain free from injury will improve, Ability to verbalize frustration and anger appropriately will improve, Ability to demonstrate self-control, Ability to participate in decision making will improve, Ability to verbalize feelings will improve, Ability to disclose and discuss suicidal ideas and Ability to identify and develop effective coping behaviors will improve  Medication Management: RN will administer medications as ordered by provider, will assess and evaluate patient's response and provide education to patient for  prescribed medication. RN will report any adverse and/or side effects to prescribing provider.  Therapeutic Interventions: 1 on 1 counseling sessions, Psychoeducation, Medication administration, Evaluate responses to treatment, Monitor vital signs and CBGs as ordered, Perform/monitor CIWA, COWS, AIMS and Fall Risk screenings as ordered, Perform wound care treatments as ordered.  Evaluation of Outcomes: Not Met   LCSW Treatment Plan for Primary Diagnosis: Schizoaffective disorder ([place]) Long Term Goal(s): Safe transition to appropriate next level of care at discharge, Engage patient in therapeutic group addressing interpersonal concerns.  Short Term Goals: Engage patient in aftercare planning with referrals and resources, Increase social support, Increase emotional regulation, Facilitate acceptance of mental health diagnosis and concerns, Identify triggers associated with mental health/substance abuse issues and Increase skills for wellness and recovery  Therapeutic Interventions: Assess for all discharge needs, 1 to 1 time with Social worker, Explore available resources and support systems, Assess for adequacy in community support network, Educate family and significant other(s) on suicide prevention, Complete Psychosocial Assessment, Interpersonal group therapy.  Evaluation of Outcomes: Not Met   Progress in Treatment: Attending groups: No. Participating in groups: No. Taking medication as prescribed: Yes. Toleration medication: Yes. Family/Significant other contact  made: No, will contact:  If consents are given  Patient understands diagnosis: No. Discussing patient identified problems/goals with staff: Yes. Medical problems stabilized or resolved: Yes. Denies suicidal/homicidal ideation: Yes. Issues/concerns per patient self-inventory: No.   New problem(s) identified: No, Describe:  None   New Short Term/Long Term Goal(s): medication stabilization, elimination of SI thoughts,  development of comprehensive mental wellness plan.   Patient Goals:  "To stay on my medications and get clarity about where I am going to live"  Discharge Plan or Barriers: Patient recently admitted. CSW will continue to follow and assess for appropriate referrals and possible discharge planning.   Reason for Continuation of Hospitalization: Aggression Medication stabilization Suicidal ideation Withdrawal symptoms  Estimated Length of Stay: 3 to 5 days   Attendees: Patient: [name]  02/05/2020   Physician: Myles Lipps, MD 02/05/2020   Nursing:  02/05/2020   RN Care Manager: 02/05/2020   Social Worker: Verdis Frederickson, [place] 02/05/2020   Recreational Therapist:  02/05/2020   Other:  02/05/2020   Other:  02/05/2020   Other: 02/05/2020     Scribe for Treatment Team: Darleen Crocker, [place] 02/05/2020 11:23 AM

## 2020-02-05 NOTE — Progress Notes (Addendum)
Pt continues to endorse SI and says that her plan to kill herself is that pt would slit her wrists.  Pt continues to also say that she would "harm anyone who gets in my way."  Pt had episodes today with verbal and physical outbursts such as throwing her trash can outside her room and banging her hands on the wall.  Pt says she is lonely.  She also reports that there is one particular Group home that she wants to move to and pt said she'll be "so upset if I don't get In there."  RN actively listened to pt and help pt process her feelings throughout the day. Pt was given vistaril for anxiety.  Pt is coping well for now and is being closely monitored.  RN will continue to provide support and assess for needs and concerns.
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
## 2020-02-05 NOTE — Progress Notes (Signed)
Patient ID: [name], female   DOB: 06-May-1990, 29 y.o.   MRN: [id number]   [facility] MD Progress Note  02/05/2020 1:36 PM [name]  MRN:  [id number]  Principal Problem: Schizoaffective disorder [facility])  Diagnosis: Principal Problem:   Schizoaffective disorder ([place])  Total Time spent with patient: 25 minutes  Subjective: [name] reports, "I'm frustrated with the whole system. I just don't like where I'm living now. All I want is to be placed in the [facility] care home in [place]. That is what I want I want. I don't like living in [place], period".  Objective:Patient is a 29 year old female with a past psychiatric history significant for schizoaffective disorder; bipolar type, borderline personality disorder, posttraumatic stress disorder, reportedly autism as well as chronic migraine headaches who presented to the [name] emergency department on 01/30/2020. The emergency room notes at that time stated that the patient had been under a great deal of stress because her deceased husband had proposed to her at Christmas time. She stated he had killed himself.    Daily Note:   [name] is seen, chart reviewed, case discussed with the treatment team. [name] is seen in her room. She is lying down in her bed. She presents with a restricted affect, barely making eye contact. She is alert, oriented & aware of situation. She is verbally responsive only that her speech is fast, almost pressured. This seem to be her norm as she has been in this [facility] multiple times & this is how her speech presents each time. She says she is frustrated with the whole system because she does not like living in [place], [place]. She says all she wants is to be placed the [facility] care home in [place], [place]. She adds, if that did not work out, she will just move to [place], [place] because her friend lives there. She currently denies any SIHI, AVH, delusional thoughts or paranoia.  She does not appear to be responding to any internal stimuli. [name] is taking & tolerating her treatment regimen. Denies any side effects. She is eating & sleeping well.  Past Medical History:  Past Medical History:  Diagnosis Date  . Acid reflux   . Anxiety   . Asthma    last attack 03/13/15 or 03/14/15  . Autism   . 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 Chronic constipation   . Depression   . Drug-seeking behavior   . Essential tremor   . Headache   . Overdose of acetaminophen 07/2017   and other meds  . Personality disorder ([place])   . Schizo-affective psychosis ([place])   . Schizoaffective disorder, bipolar type ([place])   . Seizures [facility])    Last seizure December 2017  . Sleep apnea     Past Surgical History:  Procedure Laterality Date  . MOUTH SURGERY  2009 or 2010   Family History:  Family History  Problem Relation Age of Onset  . Mental illness Father   . Asthma Father   . PDD Brother   . Seizures Brother    Social History:  Social History   Substance and Sexual Activity  Alcohol Use No  . Alcohol/week: 1.0 standard drink  . Types: 1 Standard drinks or equivalent per week   Comment: denies at this time     Social History   Substance and Sexual Activity  Drug Use No   Comment: History of cocaine use at age 79 for 4 months  Social History   Socioeconomic History  . Marital status: Widowed    Spouse name: Not on file  . Number of children: 0  . Years of education: Not on file  . Highest education level: Not on file  Occupational History  . Occupation: disability  Tobacco Use  . Smoking status: Former Smoker    Packs/day: 0.00  . Smokeless tobacco: Never Used  . Tobacco comment: Smoked for 2  years age [age]  Vaping Use  . Vaping Use: Never used  Substance and Sexual Activity  . Alcohol use: No    Alcohol/week: 1.0 standard drink    Types: 1 Standard drinks or equivalent per week    Comment: denies at this time  . Drug use: No    Comment: History of  cocaine use at age 64 for 4 months  . Sexual activity: Yes    Birth control/protection: None  Other Topics Concern  . Not on file  Social History Narrative   Marital status: Widowed      Children: daughter      Lives: with boyfriend, in two story home      Employment:  Disability      Tobacco: quit smoking; smoked for two years.      Alcohol ;none      Drugs: none   Has not traveled outside of the country.   Right handed         Social Determinants of Health   Financial Resource Strain: Not on file  Food Insecurity: Not on file  Transportation Needs: Not on file  Physical Activity: Not on file  Stress: Not on file  Social Connections: Not on file   Additional Social History:   Sleep: Good  Appetite:  Good  Current Medications: Current Facility-Administered Medications  Medication Dose Route Frequency Provider Last Rate Last Admin  . acetaminophen (TYLENOL) tablet 650 mg  650 mg Oral Q6H PRN Money, Gerlene Burdock, FNP      . alum & mag hydroxide-simeth (MAALOX/MYLANTA) 200-200-20 MG/5ML suspension 30 mL  30 mL Oral Q4H PRN Money, Gerlene Burdock, FNP      . hydrOXYzine (ATARAX/VISTARIL) tablet 25 mg  25 mg Oral TID PRN Money, Gerlene Burdock, FNP   25 mg at 02/05/20 1043  . magnesium hydroxide (MILK OF MAGNESIA) suspension 30 mL  30 mL Oral Daily PRN Money, Gerlene Burdock, FNP      . mometasone-formoterol (DULERA) 200-5 MCG/ACT inhaler 2 puff  2 puff Inhalation BID Money, Gerlene Burdock, FNP   2 puff at 02/05/20 0751  . ondansetron (ZOFRAN) tablet 4 mg  4 mg Oral Q8H PRN Antonieta Pert, MD      . pantoprazole (PROTONIX) EC tablet 40 mg  40 mg Oral Daily Money, Gerlene Burdock, FNP   40 mg at 02/05/20 0750  . risperiDONE (RISPERDAL M-TABS) disintegrating tablet 2 mg  2 mg Oral Daily Antonieta Pert, MD   2 mg at 02/05/20 0750  . risperiDONE (RISPERDAL) tablet 3 mg  3 mg Oral QHS Money, Travis B, FNP   3 mg at 02/04/20 2113  . sertraline (ZOLOFT) tablet 150 mg  150 mg Oral Daily Money, Gerlene Burdock, FNP   150  mg at 02/05/20 0750  . SUMAtriptan (IMITREX) tablet 50 mg  50 mg Oral BID PRN Money, Gerlene Burdock, FNP   50 mg at 02/05/20 0603  . traZODone (DESYREL) tablet 200 mg  200 mg Oral QHS Money, Gerlene Burdock, FNP   200 mg at 02/04/20  2113   Lab Results:  No results found for this or any previous visit (from the past 48 hour(s)). Blood Alcohol level:  Lab Results  Component Value Date   ETH <10 01/30/2020   ETH <10 [id number]   Metabolic Disorder Labs: Lab Results  Component Value Date   HGBA1C 5.0 07/02/2019   MPG 96.8 07/02/2019   MPG 91.06 03/06/2019   Lab Results  Component Value Date   PROLACTIN 129.0 (H) 01/19/2020   PROLACTIN 128.0 (H) 01/18/2020   Lab Results  Component Value Date   CHOL 173 01/18/2020   TRIG 159 (H) 01/18/2020   HDL 49 01/18/2020   CHOLHDL 3.5 01/18/2020   VLDL 32 01/18/2020   LDLCALC 92 01/18/2020   LDLCALC 122 (H) 07/02/2019   Physical Findings: AIMS: Facial and Oral Movements Muscles of Facial Expression: None, normal Lips and Perioral Area: None, normal Jaw: None, normal Tongue: None, normal,Extremity Movements Upper (arms, wrists, hands, fingers): None, normal Lower (legs, knees, ankles, toes): None, normal, Trunk Movements Neck, shoulders, hips: None, normal, Overall Severity Severity of abnormal movements (highest score from questions above): None, normal Incapacitation due to abnormal movements: None, normal Patient's awareness of abnormal movements (rate only patient's report): No Awareness, Dental Status Current problems with teeth and/or dentures?: No Does patient usually wear dentures?: No  CIWA:    COWS:     Musculoskeletal: Strength & Muscle Tone: within normal limits Gait & Station: normal Patient leans: N/A  Psychiatric Specialty Exam: Physical Exam Vitals and nursing note reviewed.  HENT:     Head: Normocephalic.     Nose: Nose normal.     Mouth/Throat:     Pharynx: Oropharynx is clear.  Eyes:     Pupils: Pupils are equal,  round, and reactive to light.  Cardiovascular:     Rate and Rhythm: Normal rate.     Comments: Elevated pulse rate : 109 Pulmonary:     Effort: Pulmonary effort is normal.  Abdominal:     Palpations: Abdomen is soft.  Genitourinary:    Comments: Deferred Musculoskeletal:        General: Normal range of motion.     Cervical back: Normal range of motion.  Skin:    General: Skin is warm and dry.  Neurological:     General: No focal deficit present.     Mental Status: She is alert and oriented to person, place, and time.     Review of Systems  Constitutional: Negative for chills, diaphoresis and fever.  HENT: Negative for congestion, rhinorrhea, sneezing and sore throat.   Eyes: Negative for discharge.  Respiratory: Negative for cough, chest tightness, shortness of breath and wheezing.   Cardiovascular: Negative for chest pain and palpitations.  Gastrointestinal: Negative for diarrhea, nausea and vomiting.  Endocrine: Negative for cold intolerance.  Genitourinary: Negative for difficulty urinating.  Musculoskeletal: Negative for arthralgias and myalgias.  Skin: Negative.   Allergic/Immunologic: Positive for environmental allergies (Bee Venom) and food allergies (Coconut, Shell fish). Negative for immunocompromised state.       Coconut  Geodon  Haloperidol  Lithium Oxycodone Seroquel   Shellfish  Phenergan  Prilosec  Sulfa drugs  Tegretol  Tape       Neurological: Negative for dizziness, tremors, seizures, syncope, facial asymmetry, speech difficulty, weakness, light-headedness, numbness and headaches.  Psychiatric/Behavioral: Positive for dysphoric mood. Negative for agitation, behavioral problems, confusion, decreased concentration, self-injury, sleep disturbance and suicidal ideas (Hx. of). The patient is not nervous/anxious and is not hyperactive.  Blood pressure 118/64, pulse (!) 109, temperature 98 F (36.7 C), temperature source Oral, resp. rate 16, height 5\' 2"   (1.575 m), weight 94.8 kg, SpO2 100 %.Body mass index is 38.23 kg/m.  General Appearance: Casual  Eye Contact:  Minimal  Speech:  Pressured,  Volume:  Increased  Mood:  Dysphoric and Irritable  Affect:  Labile  Thought Process:  Coherent and Descriptions of Associations: Circumstantial  Orientation:  Full (Time, Place, and Person)  Thought Content:  Delusions, Paranoid Ideation and Rumination  Suicidal Thoughts:  Currently denies any thoughts, plans or intent.  Homicidal Thoughts:  Denies  Memory:  Immediate;   Fair Recent;   Fair Remote;   Poor  Judgement:  Impaired  Insight:  Lacking  Psychomotor Activity:  Increased  Concentration:  Concentration: Poor and Attention Span: Poor  Recall:  Poor  Fund of Knowledge:  Poor  Language:  Fair  Akathisia:  Negative  Handed:  Right  AIMS (if indicated):     Assets:  Desire for Improvement Resilience  ADL's:  Intact  Cognition:  Impaired,  Moderate  Sleep:  Number of Hours: 6.25   Treatment Plan Summary: Daily contact with patient to assess and evaluate symptoms and progress in treatment and Medication management.  Continue inpatient hospitalization. Will continue today 02/05/2020 plan as below except where it is noted.  Mood control. Continue Risperdal M-tabs 2 mg po daily. Continue Risperdal 3 mg po Q bedtime.  Depression. Continue Sertraline 150 mg po daily.  Insomnia Continue Trazodone 200 mg po Q bedtime.  Continue: Mylanta 30 mg po Q 4 hrs prn for indigestion. Vistaril 25 mg po tid prn for anxiety. MOM 30 ml po daily prn for constipation. Dulera 200-5 MCG/ACT 2 puffs bid for SOB. Zofran 4 mg po Q 8 hrs prn. Protonix 40 mg po Q am for GERD. Imitrex 50 mg po bid prn for migraine headaches.  Lindell Spar, NP, PMHNP, FNP-BC 02/05/2020, 1:36 PM Patient ID: [name], female   DOB: 02/24/1990, 29 y.o.   MRN: [id number] Patient ID: [name], female   DOB: 10-15-1990, 29 y.o.   MRN:  [id number]

## 2020-02-05 NOTE — Progress Notes (Signed)
Recreation Therapy Notes  Date:  12.31.21 Time: 0930 Location: [address] Dayroom  Group Topic: Stress Management  Goal Area(s) Addresses:  Patient will identify positive stress management techniques. Patient will identify benefits of using stress management post d/c.  Behavioral Response: Engaged  Intervention: Stress Management  Activity:  Meditation.  LRT played a meditation that focused on reflecting on the year that was while preparing for the year to come.  Patients were to follow along as the meditation played and think of the accomplishments they had and even if there were setbacks, find the lesson in it.    Education:  Stress Management, Discharge Planning.   Education Outcome: Acknowledges Education  Clinical Observations/Feedback: Pt attended and participated in group.  Pt seemed to be anxious at times, evidenced by her shaking her legs during the meditation.  Pt was able to work through that anxiousness and continue on with the meditation.    Caroll Rancher, LRT/CTRS    Lillia Abed, Octa Uplinger A 02/05/2020 10:11 AM

## 2020-02-05 NOTE — Progress Notes (Signed)
   02/05/20 2155  Psych Admission Type (Psych Patients Only)  Admission Status Voluntary  Psychosocial Assessment  Patient Complaints Anxiety;Depression  Eye Contact Fair  Facial Expression Anxious  Affect Anxious  Interaction Attention-seeking;Assertive  Motor Activity Other (Comment) (WDL)  Appearance/Hygiene Improved  Behavior Characteristics Appropriate to situation  Mood Labile  Thought Process  Coherency WDL  Content WDL  Delusions None reported or observed  Perception WDL  Hallucination None reported or observed  Judgment Poor  Confusion None  Danger to Self  Current suicidal ideation? Passive  Self-Injurious Behavior No self-injurious ideation or behavior indicators observed or expressed   Agreement Not to Harm Self Yes  Description of Agreement verbal agreement to not harm self  Danger to Others  Danger to Others None reported or observed

## 2020-02-05 NOTE — Progress Notes (Signed)
Upon assessment earlier tonight, pt was resting in bed. Pt said that her plan is to attend a family care home. Pt appears slightly irritable, sad, sullen, and depressed. She did get up to take her bedtime medications, but did not participate in group. Pt is passively suicidal without a plan while here at [facility]. She verbally contracts for safety. Pt agrees to notify staff immediately for any thoughts of hurting herself or anyone else. She denies HI and AVH. Medications administered as ordered by MD. Active listening, reassurance, and support provided. Q 15 min safety checks continue. Pt's safety has been maintained.   02/04/20 2113  Psych Admission Type (Psych Patients Only)  Admission Status Voluntary  Psychosocial Assessment  Patient Complaints Anxiety;Depression;Isolation;Sadness  Eye Contact Brief  Facial Expression Anxious;Flat;Sad;Sullen  Affect Irritable;Sad;Sullen  Speech Logical/coherent;Slow;Soft  Interaction Assertive;Avoidant;Childlike;Isolative  Motor Activity Slow  Appearance/Hygiene Disheveled  Behavior Characteristics Cooperative;Anxious;Irritable  Mood Depressed;Anxious;Irritable;Sad;Sullen  Thought Process  Coherency WDL  Content WDL  Delusions None reported or observed  Perception WDL  Hallucination None reported or observed  Judgment Impaired  Confusion None  Danger to Self  Current suicidal ideation? Passive  Self-Injurious Behavior Some self-injurious ideation observed or expressed.  No lethal plan expressed   Agreement Not to Harm Self Yes  Description of Agreement verbal agreement to not harm self  Danger to Others  Danger to Others None reported or observed

## 2020-02-06 LAB — PROLACTIN: Prolactin: 66.1 ng/mL — ABNORMAL HIGH (ref 4.8–23.3)

## 2020-02-06 MED ORDER — ALBUTEROL SULFATE HFA 108 (90 BASE) MCG/ACT IN AERS
INHALATION_SPRAY | RESPIRATORY_TRACT | Status: AC
  Administered 2020-02-06: 1
  Filled 2020-02-06: qty 6.7

## 2020-02-06 MED ORDER — ALBUTEROL SULFATE HFA 108 (90 BASE) MCG/ACT IN AERS: 1.0000 | INHALATION_SPRAY | Freq: Four times a day (QID) | RESPIRATORY_TRACT | Status: DC | PRN

## 2020-02-06 MED ORDER — BENZOCAINE 10 % MT GEL: Freq: Four times a day (QID) | OROMUCOSAL | Status: DC | PRN

## 2020-02-06 NOTE — Progress Notes (Signed)
Patient ID: [name], female   DOB: 08/18/1990, 30 y.o.   MRN: [id number]   [facility] MD Progress Note  02/06/2020 11:22 AM [name]  MRN:  [id number]  Principal Problem: Schizoaffective disorder [facility])  Diagnosis: Principal Problem:   Schizoaffective disorder (HCC)  Total Time spent with patient: 15 minutes  Subjective: [name] reports, "I'm doing pretty good today, I just miss my apartment. I slept well last night. The Zoloft is helping".  Objective:Patient is a 30 year old female with a past psychiatric history significant for schizoaffective disorder; bipolar type, borderline personality disorder, posttraumatic stress disorder, reportedly autism as well as chronic migraine headaches who presented to the [facility] emergency department on 01/30/2020. The emergency room notes at that time stated that the patient had been under a great deal of stress because her deceased husband had proposed to her at Christmas time. She stated he had killed himself.    Daily Note:   [name] is seen, chart reviewed, case discussed with the treatment team. She is visible on the unit. She is lying down on the floor on the hall-way but was able to get up when instructed to do so. She says she likes to lie flat on the floor sometimes in her apartment. She presents with an improved affect, making a fair eye contact. She is alert, oriented & aware of situation. She is verbally responsive only that her speech remains fast, almost pressured. This seem to be her norm as she has been in this [facility] multiple times & this is how her speech presents each time. She says she is doing well today. She is still wanting to be placed in the [facility] in [place], [place], but reports today that she misses her apartment here in [place] as well. She currently denies any SIHI, AVH, delusional thoughts or paranoia. She does not appear to be responding to any internal stimuli. [name] is taking  & tolerating her treatment regimen. Denies any side effects. She is eating & sleeping well, attending group sessions.  Past Medical History:  Past Medical History:  Diagnosis Date  . Acid reflux   . Anxiety   . Asthma    last attack 03/13/15 or 03/14/15  . Autism   . 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 Chronic constipation   . Depression   . Drug-seeking behavior   . Essential tremor   . Headache   . Overdose of acetaminophen 07/2017   and other meds  . Personality disorder (HCC)   . Schizo-affective psychosis (HCC)   . Schizoaffective disorder, bipolar type (HCC)   . Seizures [facility])    Last seizure December 2017  . Sleep apnea     Past Surgical History:  Procedure Laterality Date  . MOUTH SURGERY  2009 or 2010   Family History:  Family History  Problem Relation Age of Onset  . Mental illness Father   . Asthma Father   . PDD Brother   . Seizures Brother    Social History:  Social History   Substance and Sexual Activity  Alcohol Use No  . Alcohol/week: 1.0 standard drink  . Types: 1 Standard drinks or equivalent per week   Comment: denies at this time     Social History   Substance and Sexual Activity  Drug Use No   Comment: History of cocaine use at age 89 for 4 months    Social History   Socioeconomic History  . Marital status: Widowed  Spouse name: Not on file  . Number of children: 0  . Years of education: Not on file  . Highest education level: Not on file  Occupational History  . Occupation: disability  Tobacco Use  . Smoking status: Former Smoker    Packs/day: 0.00  . Smokeless tobacco: Never Used  . Tobacco comment: Smoked for 2  years age [age]  Vaping Use  . Vaping Use: Never used  Substance and Sexual Activity  . Alcohol use: No    Alcohol/week: 1.0 standard drink    Types: 1 Standard drinks or equivalent per week    Comment: denies at this time  . Drug use: No    Comment: History of cocaine use at age 66 for 4 months  . Sexual  activity: Yes    Birth control/protection: None  Other Topics Concern  . Not on file  Social History Narrative   Marital status: Widowed      Children: daughter      Lives: with boyfriend, in two story home      Employment:  Disability      Tobacco: quit smoking; smoked for two years.      Alcohol ;none      Drugs: none   Has not traveled outside of the country.   Right handed         Social Determinants of Health   Financial Resource Strain: Not on file  Food Insecurity: Not on file  Transportation Needs: Not on file  Physical Activity: Not on file  Stress: Not on file  Social Connections: Not on file   Additional Social History:   Sleep: Good  Appetite:  Good  Current Medications: Current Facility-Administered Medications  Medication Dose Route Frequency Provider Last Rate Last Admin  . acetaminophen (TYLENOL) tablet 650 mg  650 mg Oral Q6H PRN Money, Lowry Ram, FNP      . alum & mag hydroxide-simeth (MAALOX/MYLANTA) 200-200-20 MG/5ML suspension 30 mL  30 mL Oral Q4H PRN Money, Lowry Ram, FNP      . hydrOXYzine (ATARAX/VISTARIL) tablet 25 mg  25 mg Oral TID PRN Money, Lowry Ram, FNP   25 mg at 02/05/20 2159  . magnesium hydroxide (MILK OF MAGNESIA) suspension 30 mL  30 mL Oral Daily PRN Money, Lowry Ram, FNP      . mometasone-formoterol (DULERA) 200-5 MCG/ACT inhaler 2 puff  2 puff Inhalation BID Money, Lowry Ram, FNP   2 puff at 02/06/20 [phone]  . ondansetron (ZOFRAN) tablet 4 mg  4 mg Oral Q8H PRN Sharma Covert, MD      . pantoprazole (PROTONIX) EC tablet 40 mg  40 mg Oral Daily Money, Lowry Ram, FNP   40 mg at 02/06/20 0825  . risperiDONE (RISPERDAL M-TABS) disintegrating tablet 2 mg  2 mg Oral Daily Sharma Covert, MD   2 mg at 02/06/20 0825  . risperiDONE (RISPERDAL) tablet 3 mg  3 mg Oral QHS Money, Lowry Ram, FNP   3 mg at 02/05/20 2159  . sertraline (ZOLOFT) tablet 150 mg  150 mg Oral Daily Money, Lowry Ram, FNP   150 mg at 02/06/20 0825  . SUMAtriptan (IMITREX)  tablet 50 mg  50 mg Oral BID PRN Money, Lowry Ram, FNP   50 mg at 02/05/20 0603  . traZODone (DESYREL) tablet 200 mg  200 mg Oral QHS Money, Lowry Ram, FNP   200 mg at 02/05/20 2159   Lab Results:  Results for orders placed or performed during the  hospital encounter of 02/04/20 (from the past 48 hour(s))  Prolactin     Status: Abnormal   Collection Time: 02/04/20  6:29 PM  Result Value Ref Range   Prolactin 66.1 (H) 4.8 - 23.3 ng/mL    Comment: (NOTE) Performed At: [facility] [place], [place] [id number] Rush Farmer MD [id number]    Blood Alcohol level:  Lab Results  Component Value Date   [facility] <10 01/30/2020   ETH <10 [id number]   Metabolic Disorder Labs: Lab Results  Component Value Date   HGBA1C 5.0 07/02/2019   MPG 96.8 07/02/2019   MPG 91.06 03/06/2019   Lab Results  Component Value Date   PROLACTIN 66.1 (H) 02/04/2020   PROLACTIN 129.0 (H) 01/19/2020   Lab Results  Component Value Date   CHOL 173 01/18/2020   TRIG 159 (H) 01/18/2020   HDL 49 01/18/2020   CHOLHDL 3.5 01/18/2020   VLDL 32 01/18/2020   LDLCALC 92 01/18/2020   LDLCALC 122 (H) 07/02/2019   Physical Findings: AIMS: Facial and Oral Movements Muscles of Facial Expression: None, normal Lips and Perioral Area: None, normal Jaw: None, normal Tongue: None, normal,Extremity Movements Upper (arms, wrists, hands, fingers): None, normal Lower (legs, knees, ankles, toes): None, normal, Trunk Movements Neck, shoulders, hips: None, normal, Overall Severity Severity of abnormal movements (highest score from questions above): None, normal Incapacitation due to abnormal movements: None, normal Patient's awareness of abnormal movements (rate only patient's report): No Awareness, Dental Status Current problems with teeth and/or dentures?: No Does patient usually wear dentures?: No  CIWA:    COWS:     Musculoskeletal: Strength & Muscle Tone: within normal limits Gait & Station:  normal Patient leans: N/A  Psychiatric Specialty Exam: Physical Exam Vitals and nursing note reviewed.  HENT:     Head: Normocephalic.     Nose: Nose normal.     Mouth/Throat:     Pharynx: Oropharynx is clear.  Eyes:     Pupils: Pupils are equal, round, and reactive to light.  Cardiovascular:     Rate and Rhythm: Normal rate.     Comments: Elevated pulse rate : 109 Pulmonary:     Effort: Pulmonary effort is normal.  Abdominal:     Palpations: Abdomen is soft.  Genitourinary:    Comments: Deferred Musculoskeletal:        General: Normal range of motion.     Cervical back: Normal range of motion.  Skin:    General: Skin is warm and dry.  Neurological:     General: No focal deficit present.     Mental Status: She is alert and oriented to person, place, and time.     Review of Systems  Constitutional: Negative for chills, diaphoresis and fever.  HENT: Negative for congestion, rhinorrhea, sneezing and sore throat.   Eyes: Negative for discharge.  Respiratory: Negative for cough, chest tightness, shortness of breath and wheezing.   Cardiovascular: Negative for chest pain and palpitations.  Gastrointestinal: Negative for diarrhea, nausea and vomiting.  Endocrine: Negative for cold intolerance.  Genitourinary: Negative for difficulty urinating.  Musculoskeletal: Negative for arthralgias and myalgias.  Skin: Negative.   Allergic/Immunologic: Positive for environmental allergies (Bee Venom) and food allergies (Coconut, Shell fish). Negative for immunocompromised state.       Coconut  Geodon  Haloperidol  Lithium Oxycodone Seroquel   Shellfish  Phenergan  Prilosec  Sulfa drugs  Tegretol  Tape       Neurological: Negative for dizziness, tremors, seizures, syncope,  facial asymmetry, speech difficulty, weakness, light-headedness, numbness and headaches.  Psychiatric/Behavioral: Positive for dysphoric mood. Negative for agitation, behavioral problems, confusion, decreased  concentration, self-injury, sleep disturbance and suicidal ideas (Hx. of). The patient is not nervous/anxious and is not hyperactive.     Blood pressure 118/64, pulse (!) 109, temperature 98 F (36.7 C), temperature source Oral, resp. rate 16, height 5\' 2"  (1.575 m), weight 94.8 kg, SpO2 100 %.Body mass index is 38.23 kg/m.  General Appearance: Casual  Eye Contact:  Minimal  Speech:  Pressured,  Volume:  Increased  Mood:  Mood is improving gradually.  Affect:  Congruent  Thought Process:  Coherent and Descriptions of Associations: Circumstantial  Orientation:  Full (Time, Place, and Person)  Thought Content:  Delusions, Paranoid Ideation and Rumination  Suicidal Thoughts:  Currently denies any thoughts, plans or intent.  Homicidal Thoughts:  Denies  Memory:  Immediate;   Fair Recent;   Fair Remote;   Poor  Judgement:  Impaired  Insight:  Lacking  Psychomotor Activity:  Increased  Concentration:  Concentration: Poor and Attention Span: Poor  Recall:  Poor  Fund of Knowledge:  Poor  Language:  Fair  Akathisia:  Negative  Handed:  Right  AIMS (if indicated):     Assets:  Desire for Improvement Resilience  ADL's:  Intact  Cognition:  Impaired,  Moderate  Sleep:  Number of Hours: 7   Treatment Plan Summary: Daily contact with patient to assess and evaluate symptoms and progress in treatment and Medication management.  Continue inpatient hospitalization. Will continue today 02/06/2020 plan as below except where it is noted.  Mood control. Continue Risperdal M-tabs 2 mg po daily. Continue Risperdal 3 mg po Q bedtime.  Depression. Continue Sertraline 150 mg po daily.  Insomnia Continue Trazodone 200 mg po Q bedtime.  Continue: Mylanta 30 mg po Q 4 hrs prn for indigestion. Vistaril 25 mg po tid prn for anxiety. MOM 30 ml po daily prn for constipation. Dulera 200-5 MCG/ACT 2 puffs bid for SOB. Zofran 4 mg po Q 8 hrs prn. Protonix 40 mg po Q am for GERD. Imitrex 50 mg po  bid prn for migraine headaches.  Lindell Spar, NP, PMHNP, FNP-BC 02/06/2020, 11:22 AM Patient ID: [name], female   DOB: 06/27/1990, 30 y.o.   MRN: [id number] Patient ID: Aricia Gleba, female   DOB: 1990-04-05, 30 y.o.   MRN: [id number] Patient ID: Kanasha Dessources, female   DOB: 07-10-1990, 30 y.o.   MRN: [id number]
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
## 2020-02-06 NOTE — [facility] Group Notes (Signed)
.  Psychoeducational Group Note  Date: 02-06-20 Time: 0900-1000    Goal Setting   Purpose of Group: This group helps to provide patients with the steps of setting a goal that is specific, measurable, attainable, realistic and time specific. A discussion on how we keep ourselves stuck with negative self talk.    Participation Level:  Active  Participation Quality:  Appropriate  Affect:  Appropriate  Cognitive:  Appropriate  Insight:  Improving  Engagement in Group:  Engaged  Additional Comments: Rates her energy at a 2/10. States her goal is to use one of the coping skills she has learned today.  Dione Housekeeper

## 2020-02-06 NOTE — [facility] Group Notes (Signed)
Patient was made aware of the group but chose not to attend.

## 2020-02-06 NOTE — [facility] Group Notes (Signed)
.  Psychoeducational Group Note    Date:02/06/2020 Time: 1300-1400    Life Skills:  A group where two lists are made. What people need and what are things that we do that are healthy. The lists are developed by the patients and it is explained that we often do the actions that are not healthy to get our list of needs met.   Purpose of Group: . The group focus' on teaching patients on how to identify their needs and how to develop the coping skills needed to get their needs met  Participation Level:  Active  Participation Quality:  Appropriate  Affect:  Appropriate  Cognitive:  Oriented  Insight:  Improving  Engagement in Group:  Engaged  Additional Comments:  Pt stayed for the whole group. Partisicipated and offered information about herself.   [name]

## 2020-02-06 NOTE — Progress Notes (Signed)
[facility] NOVEL CORONAVIRUS (COVID-19) DAILY CHECK-OFF SYMPTOMS - answer yes or no to each - every day NO YES  Have you had a fever in the past 24 hours?  . Fever (Temp > 37.80C / 100F) X   Have you had any of these symptoms in the past 24 hours? . New Cough .  Sore Throat  .  Shortness of Breath .  Difficulty Breathing .  Unexplained Body Aches   X   Have you had any one of these symptoms in the past 24 hours not related to allergies?   . Runny Nose .  Nasal Congestion .  Sneezing   X   If you have had runny nose, nasal congestion, sneezing in the past 24 hours, has it worsened?  X   EXPOSURES - check yes or no X   Have you traveled outside the state in the past 14 days?  X   Have you been in contact with someone with a confirmed diagnosis of COVID-19 or PUI in the past 14 days without wearing appropriate PPE?  X   Have you been living in the same home as a person with confirmed diagnosis of COVID-19 or a PUI (household contact)?    X   Have you been diagnosed with COVID-19?    X              What to do next: Answered NO to all: Answered YES to anything:   Proceed with unit schedule Follow the [facility] Inpatient Flowsheet.   

## 2020-02-06 NOTE — Progress Notes (Signed)
Patient could be heard opening and closing her bathroom door repeatly and could be heard at nursing station. Writer walked down and asked what was wrong and she reported that she can do what she wants too. Writer asked that she walk with her and talk which she did. She reported that she was ready to discharge and writer explained that this type of behavior would not help. She calmed down and stopped. Will continue to monitor, safety maintained with 15 min checks.

## 2020-02-06 NOTE — [facility] Group Notes (Signed)
  [facility] LCSW Group Therapy Note  Date/Time:  02/06/2020 10:00AM-11:00AM  Type of Therapy and Topic:  Group Therapy:  Self-Care Wheel  Participation Level:  Active   Description of Group This process group involved patients discussing the importance of self-care in different areas of life (professional, personal, emotional, psychological, spiritual, and physical) in order to achieve healthy life balance.  The group talked about what self-care in each of those areas would constitute and then specifically listed how they want to provide themselves with improved self-care in this new year.      Therapeutic Goals 1. Patient will learn how to break self-care down into various areas of life 2. Patient will participate in generating ideas about healthy self-care options in each category 3. Patients will be supportive of one another and receive support from others 4. 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  Also, she wants to remain clean for 2 years and out of the hospital for the same 2 years so that she can get care of her little girl back.  She was able to identify ways in which self-care in different areas would help to reach that goal.  Patient's participation in group was appropriate and contributory.     Therapeutic Modalities Processing Psychoeducation   Ambrose Mantle, LCSW 02/06/2020 3:21 PM

## 2020-02-07 DIAGNOSIS — F250 Schizoaffective disorder, bipolar type: Principal | ICD-10-CM

## 2020-02-07 MED ORDER — IBUPROFEN 600 MG PO TABS
ORAL_TABLET | ORAL | Status: AC
  Filled 2020-02-07: qty 1

## 2020-02-07 MED ORDER — IBUPROFEN 600 MG PO TABS
600.0000 mg | ORAL_TABLET | Freq: Four times a day (QID) | ORAL | Status: DC | PRN
  Administered 2020-02-07 – 2020-02-08 (×3): 600 mg via ORAL
  Filled 2020-02-07 (×2): qty 1
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
## 2020-02-07 NOTE — Progress Notes (Signed)
D.  Pt reports pain in left knee, states that she has had past surgery on both knees and sometimes weather affects it.  Pt limping at times, other times walking without limp.    A.  Ibuprofen and ice pack given as ordered for knee pain (see pain flow sheet).  Support and encouragement offered, fall precautions gone over with patient.    R.  Pt verbalized understanding of fall precautions, will continue to monitor.

## 2020-02-07 NOTE — Progress Notes (Signed)
   02/07/20 2137  Psych Admission Type (Psych Patients Only)  Admission Status Voluntary  Psychosocial Assessment  Patient Complaints Anxiety  Eye Contact Fair  Facial Expression Anxious;Flat  Affect Appropriate to circumstance  Speech Logical/coherent  Interaction Attention-seeking;Assertive;Childlike  Motor Activity Fidgety  Appearance/Hygiene Improved  Behavior Characteristics Appropriate to situation  Mood Anxious;Pleasant  Thought Process  Coherency Concrete thinking  Content Preoccupation  Delusions None reported or observed  Perception WDL  Hallucination None reported or observed  Judgment Poor  Confusion None  Danger to Self  Current suicidal ideation? Denies  Self-Injurious Behavior No self-injurious ideation or behavior indicators observed or expressed   Agreement Not to Harm Self Yes  Description of Agreement verbally contracts  Danger to Others  Danger to Others None reported or observed

## 2020-02-07 NOTE — [facility] Group Notes (Signed)
Adult Psychoeducational Group Not Date:  02/07/2020 Time:  0900-1045 Group Topic/Focus: PROGRESSIVE RELAXATION. A group where deep breathing is taught and tensing and relaxation muscle groups is used. Imagery is used as well.  Pts are asked to imagine 3 pillars that hold them up when they are not able to hold themselves up.  Participation Level:  Did not attend   Dione Housekeeper 02/07/2020

## 2020-02-07 NOTE — Progress Notes (Signed)
[facility] MD Progress Note  02/07/2020 9:58 AM [name]  MRN:  [id number] Subjective:  Patient reports that she slept well overnight and had not nightmares. Patient reports that she is eating well and her mood this AM is "good." Patient reports that she really likes the Zoloft and feels much better on this medication and does not report averse side effects. Patient reports that she did have SI yesterday but she does not have any today. Patient reports that she is looking to go home soon as she plans to start school again at [employer] next week and will need to get her books this week and pay some bills. Patient reports that she was a bit irritated yesterday as she does recall slamming her bathroom door and telling nurses she could do "whatever I want." Upon further discussion patient recognizes this was inappropriate behavior and reports that she if she is feeling agitated she should talk to someone or go to the dayroom to color. Patient does not endorse HI nor AVH today. Principal Problem: Schizoaffective disorder ([place]) Diagnosis: Principal Problem:   Schizoaffective disorder ([place])  Total Time spent with patient: 15 minutes  Past Psychiatric History: See H&P  Past Medical History:  Past Medical History:  Diagnosis Date  . Acid reflux   . Anxiety   . Asthma    last attack 03/13/15 or 03/14/15  . Autism   . 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 Chronic constipation   . Depression   . Drug-seeking behavior   . Essential tremor   . Headache   . Overdose of acetaminophen 07/2017   and other meds  . Personality disorder ([place])   . Schizo-affective psychosis ([place])   . Schizoaffective disorder, bipolar type ([place])   . Seizures [facility])    Last seizure December 2017  . Sleep apnea     Past Surgical History:  Procedure Laterality Date  . MOUTH SURGERY  2009 or 2010   Family History:  Family History  Problem Relation Age of Onset  . Mental illness Father   . Asthma Father   . PDD Brother   .  Seizures Brother    Family Psychiatric  History: See H&P Social History:  Social History   Substance and Sexual Activity  Alcohol Use No  . Alcohol/week: 1.0 standard drink  . Types: 1 Standard drinks or equivalent per week   Comment: denies at this time     Social History   Substance and Sexual Activity  Drug Use No   Comment: History of cocaine use at age 40 for 4 months    Social History   Socioeconomic History  . Marital status: Widowed    Spouse name: Not on file  . Number of children: 0  . Years of education: Not on file  . Highest education level: Not on file  Occupational History  . Occupation: disability  Tobacco Use  . Smoking status: Former Smoker    Packs/day: 0.00  . Smokeless tobacco: Never Used  . Tobacco comment: Smoked for 2  years age [age]  Vaping Use  . Vaping Use: Never used  Substance and Sexual Activity  . Alcohol use: No    Alcohol/week: 1.0 standard drink    Types: 1 Standard drinks or equivalent per week    Comment: denies at this time  . Drug use: No    Comment: History of cocaine use at age 71 for 4 months  . Sexual activity: Yes    Birth  control/protection: None  Other Topics Concern  . Not on file  Social History Narrative   Marital status: Widowed      Children: daughter      Lives: with boyfriend, in two story home      Employment:  Disability      Tobacco: quit smoking; smoked for two years.      Alcohol ;none      Drugs: none   Has not traveled outside of the country.   Right handed         Social Determinants of Health   Financial Resource Strain: Not on file  Food Insecurity: Not on file  Transportation Needs: Not on file  Physical Activity: Not on file  Stress: Not on file  Social Connections: Not on file   Additional Social History:                         Sleep: Good  Appetite:  Good  Current Medications: Current Facility-Administered Medications  Medication Dose Route Frequency Provider Last  Rate Last Admin  . acetaminophen (TYLENOL) tablet 650 mg  650 mg Oral Q6H PRN Money, Gerlene Burdock, FNP      . albuterol (VENTOLIN HFA) 108 (90 Base) MCG/ACT inhaler 1-2 puff  1-2 puff Inhalation Q6H PRN Antonieta Pert, MD      . alum & mag hydroxide-simeth (MAALOX/MYLANTA) 200-200-20 MG/5ML suspension 30 mL  30 mL Oral Q4H PRN Money, Gerlene Burdock, FNP      . benzocaine (ORAJEL) 10 % mucosal gel   Mouth/Throat QID PRN Antonieta Pert, MD      . hydrOXYzine (ATARAX/VISTARIL) tablet 25 mg  25 mg Oral TID PRN Money, Gerlene Burdock, FNP   25 mg at 02/06/20 1605  . magnesium hydroxide (MILK OF MAGNESIA) suspension 30 mL  30 mL Oral Daily PRN Money, Gerlene Burdock, FNP      . mometasone-formoterol (DULERA) 200-5 MCG/ACT inhaler 2 puff  2 puff Inhalation BID Money, Gerlene Burdock, FNP   2 puff at 02/07/20 0745  . ondansetron (ZOFRAN) tablet 4 mg  4 mg Oral Q8H PRN Antonieta Pert, MD      . pantoprazole (PROTONIX) EC tablet 40 mg  40 mg Oral Daily Money, Gerlene Burdock, FNP   40 mg at 02/07/20 0745  . risperiDONE (RISPERDAL M-TABS) disintegrating tablet 2 mg  2 mg Oral Daily Antonieta Pert, MD   2 mg at 02/07/20 0745  . risperiDONE (RISPERDAL) tablet 3 mg  3 mg Oral QHS Money, Gerlene Burdock, FNP   3 mg at 02/06/20 2032  . sertraline (ZOLOFT) tablet 150 mg  150 mg Oral Daily Money, Gerlene Burdock, FNP   150 mg at 02/07/20 0745  . SUMAtriptan (IMITREX) tablet 50 mg  50 mg Oral BID PRN Money, Gerlene Burdock, FNP   50 mg at 02/06/20 1125  . traZODone (DESYREL) tablet 200 mg  200 mg Oral QHS Money, Gerlene Burdock, FNP   200 mg at 02/06/20 2032    Lab Results: No results found for this or any previous visit (from the past 48 hour(s)).  Blood Alcohol level:  Lab Results  Component Value Date   [facility] <10 01/30/2020   ETH <10 01/25/2020    Metabolic Disorder Labs: Lab Results  Component Value Date   HGBA1C 5.0 07/02/2019   MPG 96.8 07/02/2019   MPG 91.06 03/06/2019   Lab Results  Component Value Date   PROLACTIN 66.1 (H) 02/04/2020  PROLACTIN 129.0 (H) 01/19/2020   Lab Results  Component Value Date   CHOL 173 01/18/2020   TRIG 159 (H) 01/18/2020   HDL 49 01/18/2020   CHOLHDL 3.5 01/18/2020   VLDL 32 01/18/2020   LDLCALC 92 01/18/2020   LDLCALC 122 (H) 07/02/2019    Physical Findings: AIMS: Facial and Oral Movements Muscles of Facial Expression: None, normal Lips and Perioral Area: None, normal Jaw: None, normal Tongue: None, normal,Extremity Movements Upper (arms, wrists, hands, fingers): None, normal Lower (legs, knees, ankles, toes): None, normal, Trunk Movements Neck, shoulders, hips: None, normal, Overall Severity Severity of abnormal movements (highest score from questions above): None, normal Incapacitation due to abnormal movements: None, normal Patient's awareness of abnormal movements (rate only patient's report): No Awareness, Dental Status Current problems with teeth and/or dentures?: No Does patient usually wear dentures?: No  CIWA:    COWS:     Musculoskeletal: Strength & Muscle Tone: within normal limits Gait & Station: patient remaines in bed on exam Patient leans: N/A  Psychiatric Specialty Exam: Physical Exam HENT:     Head: Normocephalic.  Pulmonary:     Effort: Pulmonary effort is normal.  Neurological:     Mental Status: She is alert.     Review of Systems  Constitutional: Negative for appetite change.  HENT: Negative for drooling.   Psychiatric/Behavioral: Negative for agitation.    Blood pressure 125/83, pulse (!) 121, temperature 97.8 F (36.6 C), temperature source Oral, resp. rate 18, height 5\' 2"  (1.575 m), weight 94.8 kg, SpO2 100 %.Body mass index is 38.23 kg/m.  General Appearance: Casual  Eye Contact:  Fair  Speech:  Clear and Coherent  Volume:  Normal  Mood:  Euthymic  Affect:  Appropriate  Thought Process:  Coherent  Orientation:  Full (Time, Place, and Person)  Thought Content:  Logical  Suicidal Thoughts:  No  Homicidal Thoughts:  No  Memory:   Recent;   Fair  Judgement:  Other:  Improving  Insight:  Shallow  Psychomotor Activity:  Normal  Concentration:  Concentration: Fair  Recall:  NA  Fund of Knowledge:  Fair  Language:  Good  Akathisia:  No  Handed:  Right  AIMS (if indicated):     Assets:  Housing Intimacy Leisure Time  ADL's:  Intact  Cognition:  WNL  Sleep:  Number of Hours: 7     Treatment Plan Summary: Daily contact with patient to assess and evaluate symptoms and progress in treatment  Mrs. Gamino is a 30 yo patient w/  Dx of schizoaffective disorder bipolar type, Borderline personality disorder, PTSD, and reported autism as well as chronic migraines. Patient appears to be improving. Patient will need to be monitored further as today is her first day with no SI. Patient does appear more future-oriented today and is happy with her current medication regimen which appears to be helping patient.   Schizoaffective disorder bipolar type -Risperdal 2mg  daily and 3mg  QHS -Zoloft 150mg  daily  GERD -Protonix 40mg   Insomnia -Trazodone 200mg  QHS  Asthma -Dulera 2 puffs daily   Continue: Mylanta 30 mg po Q 4 hrs prn for indigestion. Vistaril 25 mg po tid prn for anxiety. MOM 30 ml po daily prn for constipation. Dulera 200-5 MCG/ACT 2 puffs bid for SOB. Zofran 4 mg po Q 8 hrs prn. Protonix 40 mg po Q am for GERD. Imitrex 50 mg po bid prn for migraine headaches.  PGY-1 Freida Busman, MD 02/07/2020, 9:58 AM

## 2020-02-07 NOTE — Progress Notes (Signed)
   02/07/20 2134  COVID-19 Daily Checkoff  Have you had a fever (temp > 37.80C/100F)  in the past 24 hours?  No  If you have had runny nose, nasal congestion, sneezing in the past 24 hours, has it worsened? No  COVID-19 EXPOSURE  Have you traveled outside the state in the past 14 days? No  Have you been in contact with someone with a confirmed diagnosis of COVID-19 or PUI in the past 14 days without wearing appropriate PPE? No  Have you been living in the same home as a person with confirmed diagnosis of COVID-19 or a PUI (household contact)? No  Have you been diagnosed with COVID-19? No

## 2020-02-07 NOTE — Progress Notes (Signed)
[place] NOVEL CORONAVIRUS (COVID-19) DAILY CHECK-OFF SYMPTOMS - answer yes or no to each - every day NO YES  Have you had a fever in the past 24 hours?  . Fever (Temp > 37.80C / 100F) X   Have you had any of these symptoms in the past 24 hours? . New Cough .  Sore Throat  .  Shortness of Breath .  Difficulty Breathing .  Unexplained Body Aches   X   Have you had any one of these symptoms in the past 24 hours not related to allergies?   . Runny Nose .  Nasal Congestion .  Sneezing   X   If you have had runny nose, nasal congestion, sneezing in the past 24 hours, has it worsened?  X   EXPOSURES - check yes or no X   Have you traveled outside the state in the past 14 days?  X   Have you been in contact with someone with a confirmed diagnosis of COVID-19 or PUI in the past 14 days without wearing appropriate PPE?  X   Have you been living in the same home as a person with confirmed diagnosis of COVID-19 or a PUI (household contact)?    X   Have you been diagnosed with COVID-19?    X              What to do next: Answered NO to all: Answered YES to anything:   Proceed with unit schedule Follow the [facility] Inpatient Flowsheet.   

## 2020-02-07 NOTE — Progress Notes (Signed)
   02/07/20 1500  Psych Admission Type (Psych Patients Only)  Admission Status Voluntary  Psychosocial Assessment  Patient Complaints Anxiety;Restlessness  Eye Contact Brief  Facial Expression Anxious  Affect Anxious;Labile  Speech Logical/coherent  Interaction Attention-seeking;Assertive;Childlike  Motor Activity Fidgety;Restless  Appearance/Hygiene Improved  Behavior Characteristics Anxious;Cooperative  Mood Anxious  Aggressive Behavior  Targets Property (pt continually banging bathroom door for no reason, wants to discharge)  Type of Behavior Unprovoked;Other (Comment) (banging bathroom door open and closing continually)  Effect No apparent injury  Thought Process  Coherency Tangential  Content Preoccupation  Delusions None reported or observed  Perception Hallucinations  Hallucination Auditory  Judgment Poor  Confusion None  Danger to Self  Current suicidal ideation? Denies (pt verbally contracts)  Self-Injurious Behavior No self-injurious ideation or behavior indicators observed or expressed   Agreement Not to Harm Self Yes  Description of Agreement verbally contracts  Danger to Others  Danger to Others None reported or observed

## 2020-02-08 MED ORDER — SERTRALINE HCL 50 MG PO TABS
150.0000 mg | ORAL_TABLET | Freq: Every day | ORAL | 0 refills | Status: DC
Start: 2020-02-09 — End: 2021-03-04

## 2020-02-08 MED ORDER — RISPERIDONE 3 MG PO TABS: 3.0000 mg | ORAL_TABLET | Freq: Every day | ORAL | 0 refills | Status: DC

## 2020-02-08 NOTE — [facility] Counselor (Signed)
CSW called [facility] to notify them of pt's d/c. CSW left a HIPPA compliant message to notify of pt's d/c today 02/08/19.  Fredirick Lathe, LCSWA Clinicial Social Worker [employer]

## 2020-02-08 NOTE — Progress Notes (Signed)
  [facility] Adult Case Management Discharge Plan :  Will you be returning to the same living situation after discharge:  Yes,  Home  At discharge, do you have transportation home?: Yes,  Safe Transport  Do you have the ability to pay for your medications: Yes,  Insurance   Release of information consent forms completed and in the chart;  Patient's signature needed at discharge.  Patient to Follow up at:  Follow-up Information    [facility]. Schedule an appointment as soon as possible for a visit.   Why: Continue with your ACTT team. You also have a hospital follow-up appointment for outpatient therapy and medication management on  Contact information: [address]  [address] [place] [address] [phone]               Next level of care provider has access to [facility] Link:no  Safety Planning and Suicide Prevention discussed: Yes,  with patient  Have you used any form of tobacco in the last 30 days? (Cigarettes, Smokeless Tobacco, Cigars, and/or Pipes): No  Has patient been referred to the Quitline?: N/A patient is not a smoker  Patient has been referred for addiction treatment: Pt. refused referral  Aram Beecham, LCSWA 02/08/2020, 10:22 AM

## 2020-02-08 NOTE — Progress Notes (Signed)
Discharge Note:  Patient denies SI/HI AVH at this time. Discharge instructions, AVS, prescriptions and transition record gone over with patient. Patient agrees to comply with medication management, follow-up visit, and outpatient therapy. Patient belongings returned to patient. Patient questions and concerns addressed and answered.  Patient ambulatory off unit.  Patient discharged to home.   

## 2020-02-08 NOTE — Discharge Summary (Addendum)
Physician Discharge Summary Note  Patient:  [name] is an 30 y.o., female MRN:  [id number] DOB:  1990-06-26 Patient phone:  [phone] (home)  Patient address:   [place] [address],  Total Time spent with patient: 15 minutes  Date of Admission:  02/04/2020 Date of Discharge: 02/08/2020  Reason for Admission:  Patient is a 30 year old female with a past psychiatric historysignificant for schizoaffective disorder; bipolar type, borderline personality disorder, posttraumatic stress disorder, reportedly autism as well as chronic migraine headaches who presented to the [facility] emergency department on 01/30/2020. The emergency room notes at that time stated that the patient had been under a great deal of stress because her deceased husband had proposed to her at Christmas time. She stated he had killed himself.   Principal Problem: Schizoaffective disorder [facility]) Discharge Diagnoses: Principal Problem:   Schizoaffective disorder [facility])   Past Psychiatric History: Bipolar disorder.   Past Medical History:  Past Medical History:  Diagnosis Date  . Acid reflux   . Anxiety   . Asthma    last attack 03/13/15 or 03/14/15  . Autism   . 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 Chronic constipation   . Depression   . Drug-seeking behavior   . Essential tremor   . Headache   . Overdose of acetaminophen 07/2017   and other meds  . Personality disorder ([place])   . Schizo-affective psychosis ([place])   . Schizoaffective disorder, bipolar type ([place])   . Seizures [facility])    Last seizure December 2017  . Sleep apnea     Past Surgical History:  Procedure Laterality Date  . MOUTH SURGERY  2009 or 2010   Family History:  Family History  Problem Relation Age of Onset  . Mental illness Father   . Asthma Father   . PDD Brother   . Seizures Brother    Family Psychiatric  History: Major depression: Father                                                       Bipolar disorder: Brother                                                       Completed suicide: Father & brother. Social History:  Social History   Substance and Sexual Activity  Alcohol Use No  . Alcohol/week: 1.0 standard drink  . Types: 1 Standard drinks or equivalent per week   Comment: denies at this time     Social History   Substance and Sexual Activity  Drug Use No   Comment: History of cocaine use at age 59 for 4 months    Social History   Socioeconomic History  . Marital status: Widowed    Spouse name: Not on file  . Number of children: 0  . Years of education: Not on file  . Highest education level: Not on file  Occupational History  . Occupation: disability  Tobacco Use  . Smoking status: Former Smoker    Packs/day: 0.00  . Smokeless tobacco: Never Used  . Tobacco comment: Smoked for 2  years  age [age]  Vaping Use  . Vaping Use: Never used  Substance and Sexual Activity  . Alcohol use: No    Alcohol/week: 1.0 standard drink    Types: 1 Standard drinks or equivalent per week    Comment: denies at this time  . Drug use: No    Comment: History of cocaine use at age 26 for 4 months  . Sexual activity: Yes    Birth control/protection: None  Other Topics Concern  . Not on file  Social History Narrative   Marital status: Widowed      Children: daughter      Lives: with boyfriend, in two story home      Employment:  Disability      Tobacco: quit smoking; smoked for two years.      Alcohol ;none      Drugs: none   Has not traveled outside of the country.   Right handed         Social Determinants of Health   Financial Resource Strain: Not on file  Food Insecurity: Not on file  Transportation Needs: Not on file  Physical Activity: Not on file  Stress: Not on file  Social Connections: Not on file    Hospital Course:  At presentation patient endorsed SI and no housing. Patient home medications: sertraline 150mg , Trazodone  200mg  QHS and PRNs were continued. Her home Risperdal was adjusted to 2mg  daily and 3mg  nightly. Patient prolactin was noted to be elevated on admission but trending down from what appears to be her baseline. Patient was noted to act out a bit on the unit with behavior that appeared to express needing attention. Patient was reprimanded and appeared to behave better when it was explained that these were not appropriate ways to express her desire for discharge.  Prior to discharge patient reports that she did have housing and a relationship with her boyfriend she had reported at admission moved to . Patient did report that he mood was improving with the medication adjustment and she felt ready to go home and get ready for her upcoming semester at [employer]. Physical Findings: AIMS: Facial and Oral Movements Muscles of Facial Expression: None, normal Lips and Perioral Area: None, normal Jaw: None, normal Tongue: None, normal,Extremity Movements Upper (arms, wrists, hands, fingers): None, normal Lower (legs, knees, ankles, toes): None, normal, Trunk Movements Neck, shoulders, hips: None, normal, Overall Severity Severity of abnormal movements (highest score from questions above): None, normal Incapacitation due to abnormal movements: None, normal Patient's awareness of abnormal movements (rate only patient's report): No Awareness, Dental Status Current problems with teeth and/or dentures?: No Does patient usually wear dentures?: No  CIWA:    COWS:     Musculoskeletal: Strength & Muscle Tone: within normal limits Gait & Station: normal Patient leans: N/A  Psychiatric Specialty Exam: Physical Exam Constitutional:      Appearance: Normal appearance.  HENT:     Head: Normocephalic and atraumatic.  Pulmonary:     Effort: Pulmonary effort is normal.  Neurological:     Mental Status: She is alert.     Review of Systems  Gastrointestinal: Negative for abdominal pain.   Musculoskeletal: Negative for arthralgias.  Neurological: Negative for headaches.    Blood pressure 116/79, pulse (!) 129, temperature 97.9 F (36.6 C), temperature source Oral, resp. rate 20, height 5\' 2"  (1.575 m), weight 94.8 kg, SpO2 100 %.Body mass index is 38.23 kg/m.  General Appearance: Casual  Eye Contact:  Fair  Speech:  Clear and Coherent  Volume:  Normal  Mood:  "excited to go home"  Affect:  Flat  Thought Process:  Linear  Orientation:  NA  Thought Content:  Logical  Suicidal Thoughts:  No  Homicidal Thoughts:  No  Memory:  Recent;   Fair  Judgement:  Fair  Insight:  Shallow  Psychomotor Activity:  Normal  Concentration:  Concentration: Fair  Recall:  NA  Fund of Knowledge:  Good  Language:  Fair  Akathisia:  No  Handed:  Right  AIMS (if indicated):     Assets:  Leisure Time  ADL's:  Intact  Cognition:  WNL  Sleep:  Number of Hours: 7     Have you used any form of tobacco in the last 30 days? (Cigarettes, Smokeless Tobacco, Cigars, and/or Pipes): No    Blood Alcohol level:  Lab Results  Component Value Date   ETH <10 01/30/2020   ETH <10 [id number]    Metabolic Disorder Labs:  Lab Results  Component Value Date   HGBA1C 5.0 07/02/2019   MPG 96.8 07/02/2019   MPG 91.06 03/06/2019   Lab Results  Component Value Date   PROLACTIN 66.1 (H) 02/04/2020   PROLACTIN 129.0 (H) 01/19/2020   Lab Results  Component Value Date   CHOL 173 01/18/2020   TRIG 159 (H) 01/18/2020   HDL 49 01/18/2020   CHOLHDL 3.5 01/18/2020   VLDL 32 01/18/2020   LDLCALC 92 01/18/2020   LDLCALC 122 (H) 07/02/2019    See Psychiatric Specialty Exam and Suicide Risk Assessment completed by Attending Physician prior to discharge.  Discharge destination:  Home  Is patient on multiple antipsychotic therapies at discharge:  No   Has Patient had three or more failed trials of antipsychotic monotherapy by history:  No  Recommended Plan for Multiple Antipsychotic  Therapies: NA  Discharge Instructions    Diet - low sodium heart healthy   Complete by: As directed    Increase activity slowly   Complete by: As directed      Allergies as of 02/08/2020      Reactions   Bee Venom Anaphylaxis   Coconut Flavor Anaphylaxis, Rash   Geodon [ziprasidone Hcl] Other (See Comments)   Pt states that this medication causes paralysis of the mouth.     Haloperidol And Related Other (See Comments)   Pt states that this medication causes paralysis of the mouth.     Lithium Other (See Comments)   Reaction:  Seizure-like activity.    Oxycodone Other (See Comments)   Reaction:  Hallucinations    Quetiapine Other (See Comments)   Pt states that this medication is too strong.    Seroquel [quetiapine Fumarate] Other (See Comments)   Pt states that this medication is too strong.    Shellfish Allergy Anaphylaxis   Phenergan [promethazine Hcl] Other (See Comments)   Reaction:  Chest pain    Prilosec [omeprazole] Nausea And Vomiting   Sulfa Antibiotics Other (See Comments)   Chest pain    Tegretol [carbamazepine] Nausea And Vomiting   Prozac [fluoxetine] Other (See Comments)   Depression , Suicidal thoughts   Tape Other (See Comments)   Skin tears, can only tolerate paper tape.      Medication List    TAKE these medications     Indication  albuterol 108 (90 Base) MCG/ACT inhaler Commonly known as: VENTOLIN HFA Inhale 1-2 puffs into the lungs every 6 (six) hours as needed for wheezing or shortness of breath.  Indication:  Asthma   fluticasone-salmeterol 115-21 MCG/ACT inhaler Commonly known as: Advair HFA Inhale 2 puffs into the lungs 2 (two) times daily. What changed:   when to take this  reasons to take this  Indication: Asthma   hydrOXYzine 25 MG tablet Commonly known as: ATARAX/VISTARIL Take 1 tablet (25 mg total) by mouth 3 (three) times daily as needed for anxiety. What changed: when to take this  Indication: Feeling Anxious   ibuprofen 200  MG tablet Commonly known as: ADVIL Take 400 mg by mouth every 6 (six) hours as needed for cramping.  Indication: Pain   ondansetron 4 MG tablet Commonly known as: ZOFRAN Take 1 tablet (4 mg total) by mouth every 8 (eight) hours as needed for nausea or vomiting.  Indication: Nausea and Vomiting   pantoprazole 40 MG tablet Commonly known as: PROTONIX Take 1 tablet (40 mg total) by mouth daily.  Indication: Gastroesophageal Reflux Disease   risperiDONE 3 MG tablet Commonly known as: RISPERDAL Take 1 tablet (3 mg total) by mouth at bedtime. What changed:   when to take this  additional instructions  Indication: Schizoaffective   sertraline 50 MG tablet Commonly known as: ZOLOFT Take 3 tablets (150 mg total) by mouth daily. Start taking on: February 09, 2020 What changed: additional instructions  Indication: Major Depressive Disorder   SUMAtriptan 50 MG tablet Commonly known as: IMITREX Take 50 mg by mouth 2 (two) times daily as needed for migraine.  Indication: Migraine Headache   traZODone 100 MG tablet Commonly known as: DESYREL Take 2 tablets (200 mg total) by mouth at bedtime. For sleep  Indication: Trouble Sleeping       Follow-up [facility]. Schedule an appointment as soon as possible for a visit.   Why: Continue with your ACTT team. Please schedule a hospital follow-up appointment for outpatient therapy and medication management. Contact information: [address]  [place] [address] [phone]               Follow-up recommendations: Follow up recommendations: - Activity as tolerated. - Diet as recommended by PCP. - Keep all scheduled follow-up appointments as recommended.   Comments:  Patient is instructed to take all prescribed medications as recommended. Report any side effects or adverse reactions to your outpatient psychiatrist. Patient is instructed to abstain from alcohol and illegal drugs while on prescription  medications. In the event of worsening symptoms, patient is instructed to call the crisis hotline, 911, or go to the nearest emergency department for evaluation and treatment.    Signed: PGY-1 Freida Busman, MD 02/08/2020, 1:32 PM

## 2020-02-08 NOTE — BHH Suicide Risk Assessment (Signed)
[facility] Discharge Suicide Risk Assessment   Principal Problem: Schizoaffective disorder [facility]) Discharge Diagnoses: Principal Problem:   Schizoaffective disorder (H[facility])   Total Time spent with patient: 20 minutes  Musculoskeletal: Strength & Muscle Tone: within normal limits Gait & Station: normal Patient leans: N/A  Psychiatric Specialty Exam: Review of Systems  Musculoskeletal: Positive for arthralgias, gait problem and joint swelling.  All other systems reviewed and are negative.   Blood pressure 116/79, pulse (!) 129, temperature 97.9 F (36.6 C), temperature source Oral, resp. rate 20, height 5\' 2"  (1.575 m), weight 94.8 kg, SpO2 100 %.Body mass index is 38.23 kg/m.  General Appearance: Casual  Eye Contact::  Fair  Speech:  Normal Rate409  Volume:  Normal  Mood:  Anxious  Affect:  Congruent  Thought Process:  Coherent and Descriptions of Associations: Intact  Orientation:  Full (Time, Place, and Person)  Thought Content:  Logical  Suicidal Thoughts:  No  Homicidal Thoughts:  No  Memory:  Immediate;   Fair Recent;   Fair Remote;   Fair  Judgement:  Intact  Insight:  Lacking  Psychomotor Activity:  Increased  Concentration:  Fair  Recall:  [phone] of Knowledge:Fair  Language: Good  Akathisia:  Negative  Handed:  Right  AIMS (if indicated):     Assets:  Desire for Improvement Housing Resilience  Sleep:  Number of Hours: 7  Cognition: WNL  ADL's:  Intact   Mental Status Per Nursing Assessment::   On Admission:  Suicidal ideation indicated by patient,Self-harm behaviors,Intention to act on plan to harm others,Intention to act on suicide plan,Suicide plan,Belief that plan would result in death,Plan includes specific time, place, or method,Self-harm thoughts  Demographic Factors:  Caucasian, Low socioeconomic status, Living alone and Unemployed  Loss Factors: Loss of significant relationship and Financial problems/change in socioeconomic status  Historical  Factors: Impulsivity  Risk Reduction Factors:   Positive therapeutic relationship  Continued Clinical Symptoms:  Schizophrenia:   Less than 9 years old  Cognitive Features That Contribute To Risk:  Thought constriction (tunnel vision)    Suicide Risk:  Minimal: No identifiable suicidal ideation.  Patients presenting with no risk factors but with morbid ruminations; may be classified as minimal risk based on the severity of the depressive symptoms   Follow-up Information    [facility] Follow up.   Why: Continue with your ACTT team. You also have a hospital follow-up appointment for outpatient therapy and medication management on  Contact information: [address]  [address] [place] [phone]               Plan Of Care/Follow-up recommendations:  Activity:  ad lib  [phone], MD 02/08/2020, 9:32 AM

## 2020-02-09 ENCOUNTER — Telehealth (HOSPITAL_COMMUNITY): Payer: Self-pay | Admitting: Family Medicine

## 2020-02-09 NOTE — Telephone Encounter (Signed)
Care Management - Follow Up [facility] Discharges   Writer made contact with the patient.  Patient reports that she will be receiving outpatient care from  With her ACTT Team from [address].

## 2020-02-11 ENCOUNTER — Ambulatory Visit (HOSPITAL_COMMUNITY): Admit: 2020-02-11 | Discharge status: Against Medical Advice | Payer: PPO

## 2020-02-12 ENCOUNTER — Telehealth: Payer: Self-pay | Admitting: Neurology

## 2020-02-12 ENCOUNTER — Other Ambulatory Visit: Payer: Self-pay

## 2020-02-12 ENCOUNTER — Ambulatory Visit (INDEPENDENT_AMBULATORY_CARE_PROVIDER_SITE_OTHER): Payer: PPO | Admitting: Neurology

## 2020-02-12 DIAGNOSIS — G43709 Chronic migraine without aura, not intractable, without status migrainosus: Secondary | ICD-10-CM | POA: Diagnosis not present

## 2020-02-12 DIAGNOSIS — R251 Tremor, unspecified: Secondary | ICD-10-CM

## 2020-02-12 MED ORDER — ONABOTULINUMTOXINA 100 UNITS IJ SOLR
155.0000 [IU] | Freq: Once | INTRAMUSCULAR | Status: AC
Start: 2020-02-12 — End: 2020-02-12
  Administered 2020-02-12: 155 [IU] via INTRAMUSCULAR

## 2020-02-12 NOTE — Progress Notes (Signed)
[facility]   Procedure Note Botox  Attending: Dr. Avarose Mervine  Preoperative Diagnosis(es): Chronic migraine  Consent obtained from: The patient Benefits discussed included, but were not limited to decreased muscle tightness, increased joint range of motion, and decreased pain.  Risk discussed included, but were not limited pain and discomfort, bleeding, bruising, excessive weakness, venous thrombosis, muscle atrophy and dysphagia.  Anticipated outcomes of the procedure as well as he risks and benefits of the alternatives to the procedure, and the roles and tasks of the personnel to be involved, were discussed with the patient, and the patient consents to the procedure and agrees to proceed. A copy of the patient medication guide was given to the patient which explains the blackbox warning.  Patients identity and treatment sites confirmed Yes.  .  Details of Procedure: Skin was cleaned with alcohol. Prior to injection, the needle plunger was aspirated to make sure the needle was not within a blood vessel.  There was no blood retrieved on aspiration.    Following is a summary of the muscles injected  And the amount of Botulinum toxin used:  Dilution 200 units of Botox was reconstituted with 4 ml of preservative free normal saline. Time of reconstitution: At the time of the office visit (<30 minutes prior to injection)   Injections  155 total units of Botox was injected with a 30 gauge needle.  Injection Sites: L occipitalis: 15 units- 3 sites  R occiptalis: 15 units- 3 sites  L upper trapezius: 15 units- 3 sites R upper trapezius: 15 units- 3 sits          L paraspinal: 10 units- 2 sites R paraspinal: 10 units- 2 sites  Face L frontalis(2 injection sites):10 units   R frontalis(2 injection sites):10 units         L corrugator: 5 units   R corrugator: 5 units           Procerus: 5 units   L temporalis: 20 units R temporalis: 20 units   Agent:  200 units of botulinum Type  A (Onobotulinum Toxin type A) was reconstituted with 4 ml of preservative free normal saline.  Time of reconstitution: At the time of the office visit (<30 minutes prior to injection)     Total injected (Units):  155  Total wasted (Units):  None wasted  Patient tolerated procedure well without complications.   Reinjection is anticipated in 3 months.   

## 2020-02-12 NOTE — Telephone Encounter (Signed)
I saw Ms. Mccullum today for Botox for migraine.  She reports that since she was recently discharged from the hospital for schizoaffective disorder, she wakes up every morning in bed with shaking and then she passes out.  It happens almost everyday.  Will check routine EEG.  If unremarkable, will then order 72 hour ambulatory EEG.

## 2020-02-22 ENCOUNTER — Other Ambulatory Visit: Payer: PPO

## 2020-03-02 ENCOUNTER — Other Ambulatory Visit: Payer: PPO

## 2020-04-04 DIAGNOSIS — G4733 Obstructive sleep apnea (adult) (pediatric): Secondary | ICD-10-CM | POA: Diagnosis not present

## 2020-04-22 DIAGNOSIS — R1011 Right upper quadrant pain: Secondary | ICD-10-CM | POA: Diagnosis not present

## 2020-04-22 DIAGNOSIS — Z8719 Personal history of other diseases of the digestive system: Secondary | ICD-10-CM | POA: Diagnosis not present

## 2020-05-03 ENCOUNTER — Telehealth: Payer: Self-pay | Admitting: Neurology

## 2020-05-03 NOTE — Telephone Encounter (Signed)
Pt advised that as long a the abscess not  located in the area where she get her  Injections then she will be fine. If it is we will just skip that area all together.    Please advise Dr.Jaffe at the time so he will be aware that the abscess is there.

## 2020-05-13 ENCOUNTER — Ambulatory Visit: Payer: PPO | Admitting: Neurology

## 2020-05-23 ENCOUNTER — Emergency Department (HOSPITAL_COMMUNITY)
Admission: EM | Admit: 2020-05-23 | Discharge: 2020-05-24 | Disposition: A | Discharge status: Home / Self Care | Payer: PPO | Attending: Emergency Medicine | Admitting: Emergency Medicine

## 2020-05-23 ENCOUNTER — Other Ambulatory Visit: Payer: Self-pay

## 2020-05-23 ENCOUNTER — Encounter (HOSPITAL_COMMUNITY): Payer: Self-pay

## 2020-05-23 DIAGNOSIS — R45850 Homicidal ideations: Secondary | ICD-10-CM | POA: Insufficient documentation

## 2020-05-23 DIAGNOSIS — R509 Fever, unspecified: Secondary | ICD-10-CM | POA: Diagnosis not present

## 2020-05-23 DIAGNOSIS — Z046 Encounter for general psychiatric examination, requested by authority: Secondary | ICD-10-CM | POA: Diagnosis present

## 2020-05-23 DIAGNOSIS — Z87891 Personal history of nicotine dependence: Secondary | ICD-10-CM | POA: Insufficient documentation

## 2020-05-23 DIAGNOSIS — Z7951 Long term (current) use of inhaled steroids: Secondary | ICD-10-CM | POA: Insufficient documentation

## 2020-05-23 DIAGNOSIS — J45909 Unspecified asthma, uncomplicated: Secondary | ICD-10-CM | POA: Diagnosis not present

## 2020-05-23 DIAGNOSIS — Z20822 Contact with and (suspected) exposure to covid-19: Secondary | ICD-10-CM | POA: Insufficient documentation

## 2020-05-23 DIAGNOSIS — R000 Tachycardia, unspecified: Secondary | ICD-10-CM | POA: Diagnosis not present
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
## 2020-05-24 DIAGNOSIS — R45850 Homicidal ideations: Secondary | ICD-10-CM | POA: Diagnosis not present

## 2020-05-24 LAB — RESP PANEL BY RT-PCR (FLU A&B, COVID) ARPGX2
Influenza A by PCR: NEGATIVE
Influenza B by PCR: NEGATIVE
SARS Coronavirus 2 by RT PCR: NEGATIVE

## 2020-05-24 MED ORDER — ACETAMINOPHEN 325 MG PO TABS: 650.0000 mg | ORAL_TABLET | Freq: Four times a day (QID) | ORAL | Status: DC | PRN

## 2020-05-24 MED ORDER — ALBUTEROL SULFATE HFA 108 (90 BASE) MCG/ACT IN AERS: 1.0000 | INHALATION_SPRAY | Freq: Four times a day (QID) | RESPIRATORY_TRACT | Status: DC | PRN

## 2020-05-24 MED ORDER — RISPERIDONE 1 MG PO TABS
1.0000 mg | ORAL_TABLET | Freq: Every morning | ORAL | Status: DC
  Filled 2020-05-24: qty 1

## 2020-05-24 MED ORDER — RISPERIDONE 2 MG PO TABS
3.0000 mg | ORAL_TABLET | Freq: Every day | ORAL | Status: DC
  Administered 2020-05-24: 3 mg via ORAL
  Filled 2020-05-24: qty 1

## 2020-05-24 MED ORDER — HYDROXYZINE HCL 25 MG PO TABS
25.0000 mg | ORAL_TABLET | Freq: Once | ORAL | Status: AC
  Administered 2020-05-24: 25 mg via ORAL
  Filled 2020-05-24: qty 1

## 2020-05-24 MED ORDER — SERTRALINE HCL 50 MG PO TABS: 150.0000 mg | ORAL_TABLET | Freq: Every day | ORAL | Status: DC

## 2020-05-24 MED ORDER — FLUTICASONE FUROATE-VILANTEROL 200-25 MCG/INH IN AEPB
1.0000 | INHALATION_SPRAY | Freq: Every day | RESPIRATORY_TRACT | Status: DC
  Filled 2020-05-24: qty 28

## 2020-05-24 NOTE — Discharge Instructions (Addendum)
Please follow up with your psychiatrist. I have provided you with a list of therapists which may be helpful.  Please continue taking your home medications as prescribed.  You may always return to the ER for reevaluation.

## 2020-05-24 NOTE — ED Notes (Signed)
Pt tolerating ice water without n/v.

## 2020-05-24 NOTE — ED Notes (Addendum)
Pt refused discharge vital signs, pt ambulated to lobby to wait for ride home by police officer that originally brought her in to ED tonight.
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
## 2020-05-24 NOTE — ED Notes (Signed)
Officer that brought pt to ER tonight told pt he would take her back home if pt discharged, spoke with police dispatch and found this to be true.  Officer aware and dispatch sent out.

## 2020-06-21 ENCOUNTER — Other Ambulatory Visit: Payer: Self-pay | Admitting: Neurology

## 2020-07-02 ENCOUNTER — Encounter (HOSPITAL_COMMUNITY): Payer: Self-pay | Admitting: Emergency Medicine

## 2020-07-02 ENCOUNTER — Emergency Department (HOSPITAL_COMMUNITY)
Admission: EM | Admit: 2020-07-02 | Discharge: 2020-07-02 | Disposition: A | Discharge status: Home / Self Care | Payer: PPO | Attending: Emergency Medicine | Admitting: Emergency Medicine

## 2020-07-02 DIAGNOSIS — R5381 Other malaise: Secondary | ICD-10-CM | POA: Diagnosis not present

## 2020-07-02 DIAGNOSIS — M79661 Pain in right lower leg: Secondary | ICD-10-CM | POA: Insufficient documentation

## 2020-07-02 DIAGNOSIS — F840 Autistic disorder: Secondary | ICD-10-CM | POA: Insufficient documentation

## 2020-07-02 DIAGNOSIS — M79604 Pain in right leg: Secondary | ICD-10-CM | POA: Diagnosis not present

## 2020-07-02 DIAGNOSIS — M5431 Sciatica, right side: Secondary | ICD-10-CM | POA: Diagnosis not present

## 2020-07-02 DIAGNOSIS — Z87891 Personal history of nicotine dependence: Secondary | ICD-10-CM | POA: Insufficient documentation

## 2020-07-02 DIAGNOSIS — R200 Anesthesia of skin: Secondary | ICD-10-CM | POA: Diagnosis not present

## 2020-07-02 DIAGNOSIS — M549 Dorsalgia, unspecified: Secondary | ICD-10-CM | POA: Diagnosis not present

## 2020-07-02 DIAGNOSIS — J45909 Unspecified asthma, uncomplicated: Secondary | ICD-10-CM | POA: Insufficient documentation

## 2020-07-02 LAB — I-STAT CHEM 8, ED
BUN: 5 mg/dL — ABNORMAL LOW (ref 6–20)
Calcium, Ion: 1.18 mmol/L (ref 1.15–1.40)
Chloride: 104 mmol/L (ref 98–111)
Creatinine, Ser: 0.6 mg/dL (ref 0.44–1.00)
Glucose, Bld: 104 mg/dL — ABNORMAL HIGH (ref 70–99)
HCT: 36 % (ref 36.0–46.0)
Hemoglobin: 12.2 g/dL (ref 12.0–15.0)
Potassium: 3.6 mmol/L (ref 3.5–5.1)
Sodium: 140 mmol/L (ref 135–145)
TCO2: 24 mmol/L (ref 22–32)

## 2020-07-02 LAB — CBC WITH DIFFERENTIAL/PLATELET
Abs Immature Granulocytes: 0.05 10*3/uL (ref 0.00–0.07)
Basophils Absolute: 0 10*3/uL (ref 0.0–0.1)
Basophils Relative: 1 %
Eosinophils Absolute: 0 10*3/uL (ref 0.0–0.5)
Eosinophils Relative: 1 %
HCT: 38.6 % (ref 36.0–46.0)
Hemoglobin: 12 g/dL (ref 12.0–15.0)
Immature Granulocytes: 1 %
Lymphocytes Relative: 30 %
Lymphs Abs: 2.5 10*3/uL (ref 0.7–4.0)
MCH: 25.7 pg — ABNORMAL LOW (ref 26.0–34.0)
MCHC: 31.1 g/dL (ref 30.0–36.0)
MCV: 82.7 fL (ref 80.0–100.0)
Monocytes Absolute: 0.6 10*3/uL (ref 0.1–1.0)
Monocytes Relative: 7 %
Neutro Abs: 5.2 10*3/uL (ref 1.7–7.7)
Neutrophils Relative %: 60 %
Platelets: 261 10*3/uL (ref 150–400)
RBC: 4.67 MIL/uL (ref 3.87–5.11)
RDW: 13.7 % (ref 11.5–15.5)
WBC: 8.4 10*3/uL (ref 4.0–10.5)
nRBC: 0 % (ref 0.0–0.2)

## 2020-07-02 LAB — I-STAT BETA HCG BLOOD, ED (MC, WL, AP ONLY): I-stat hCG, quantitative: <5 m[IU]/mL (ref <5)

## 2020-07-02 LAB — D-DIMER, QUANTITATIVE: D-Dimer, Quant: <0.27 ug/mL-FEU (ref 0.00–0.50)

## 2020-07-02 MED ORDER — LIDOCAINE 5 % EX PTCH: 1.0000 | MEDICATED_PATCH | CUTANEOUS | 0 refills | Status: DC

## 2020-07-02 MED ORDER — IBUPROFEN 800 MG PO TABS
800.0000 mg | ORAL_TABLET | Freq: Once | ORAL | Status: AC
  Administered 2020-07-02: 800 mg via ORAL
  Filled 2020-07-02: qty 1

## 2020-07-02 MED ORDER — ACETAMINOPHEN 500 MG PO TABS
1000.0000 mg | ORAL_TABLET | Freq: Once | ORAL | Status: AC
  Administered 2020-07-02: 1000 mg via ORAL
  Filled 2020-07-02: qty 2

## 2020-07-02 MED ORDER — LIDOCAINE 5 % EX PTCH
2.0000 | MEDICATED_PATCH | CUTANEOUS | Status: DC
  Administered 2020-07-02: 2 via TRANSDERMAL
  Filled 2020-07-02: qty 2

## 2020-07-02 NOTE — ED Triage Notes (Signed)
Pt c/o R leg pain. Pt states that behing her knee and her calf are sore to touch. Pt has hx of DVT. Pt states that the pain started in her lower back and now it is mostly her R leg. Pt states the posterior portion of her R leg feels numb
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
## 2020-07-05 DIAGNOSIS — G4733 Obstructive sleep apnea (adult) (pediatric): Secondary | ICD-10-CM | POA: Diagnosis not present

## 2020-08-12 ENCOUNTER — Ambulatory Visit: Payer: PPO | Admitting: Neurology

## 2020-08-15 ENCOUNTER — Telehealth: Payer: Self-pay | Admitting: Neurology

## 2020-08-15 NOTE — Telephone Encounter (Signed)
Not having Botox shouldn't cause vision issues.  Now if she is having increased migraines and the vision problems are accompanying migraine attacks, then it could be related to migraine.  Otherwise, she should follow up with an eyedoctor.   Tried calling pt, No answer.

## 2020-08-15 NOTE — Telephone Encounter (Signed)
Pt would like to know since she has not been doing botox, will that make her have issues with her vision. Would like a call back [phone]

## 2020-08-19 NOTE — Telephone Encounter (Signed)
Called patient and was unable to leave a message for a call back

## 2020-09-06 DIAGNOSIS — M545 Low back pain, unspecified: Secondary | ICD-10-CM | POA: Diagnosis not present

## 2020-09-06 DIAGNOSIS — R000 Tachycardia, unspecified: Secondary | ICD-10-CM | POA: Diagnosis not present

## 2020-09-06 DIAGNOSIS — J4540 Moderate persistent asthma, uncomplicated: Secondary | ICD-10-CM | POA: Diagnosis not present

## 2020-09-06 DIAGNOSIS — K219 Gastro-esophageal reflux disease without esophagitis: Secondary | ICD-10-CM | POA: Diagnosis not present

## 2020-09-06 DIAGNOSIS — Z6837 Body mass index (BMI) 37.0-37.9, adult: Secondary | ICD-10-CM | POA: Diagnosis not present

## 2020-09-06 DIAGNOSIS — F209 Schizophrenia, unspecified: Secondary | ICD-10-CM | POA: Diagnosis not present

## 2020-09-06 DIAGNOSIS — F3130 Bipolar disorder, current episode depressed, mild or moderate severity, unspecified: Secondary | ICD-10-CM | POA: Diagnosis not present

## 2020-09-06 DIAGNOSIS — E221 Hyperprolactinemia: Secondary | ICD-10-CM | POA: Diagnosis not present

## 2020-09-06 DIAGNOSIS — G4733 Obstructive sleep apnea (adult) (pediatric): Secondary | ICD-10-CM | POA: Diagnosis not present

## 2020-09-19 ENCOUNTER — Ambulatory Visit (HOSPITAL_COMMUNITY)
Admission: EM | Admit: 2020-09-19 | Discharge: 2020-09-19 | Disposition: A | Discharge status: Home / Self Care | Payer: PPO | Attending: Psychiatry | Admitting: Psychiatry

## 2020-09-19 ENCOUNTER — Other Ambulatory Visit: Payer: Self-pay

## 2020-09-19 DIAGNOSIS — F4322 Adjustment disorder with anxiety: Secondary | ICD-10-CM | POA: Diagnosis not present

## 2020-09-19 DIAGNOSIS — F840 Autistic disorder: Secondary | ICD-10-CM | POA: Diagnosis not present

## 2020-09-19 DIAGNOSIS — Z9151 Personal history of suicidal behavior: Secondary | ICD-10-CM | POA: Insufficient documentation

## 2020-09-19 DIAGNOSIS — F250 Schizoaffective disorder, bipolar type: Secondary | ICD-10-CM | POA: Diagnosis not present

## 2020-09-19 NOTE — ED Provider Notes (Addendum)
Behavioral Health Urgent Care Medical Screening Exam  Patient Name: [name] MRN: [id number] Date of Evaluation: 09/19/20 Chief Complaint:  "I need help" Diagnosis:  Final diagnoses:  Schizoaffective disorder, bipolar type ([place])  Adjustment disorder with anxious mood    History of Present illness: [name] is a 30 y.o. female w/ PPH of BPD, Autism, and Schizoaffective disorder bipolar type.  Patient presented voluntarily via EMS today. On assessment patient is seen very anxious and hanging out of the door to her assessment room, but is redirected to sit down and begin exam. On exam patient begins speak somewhat rapidly about how she is having a hard time transitioning from her [facility] ACT[facility] to CST. Patient reports "I know it is good for me to go down to a CST, but I really need someone to talk to." Patient reports that she is trying to get custody back of her daughter and patient has set goals for herself this year including not going back into a behavioral health hospital. Patient reports that she has been using her coping skills and remains complaint with her meds and got a job. 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 Patient reports that she has also been feeling overwhelmed as she was having supervised visits with her daughter and "[employer]" however her visitation was one Sunday's and the public transport stopped servicing the area on that day leaving the patient unable to have a way to afford to go see her daughter. Patient reports that when she notified "[facility] [facility]" why she was not able to make Sunday work for visitation she was told that her inability to see her daughter would likely be used against in her in court. Patient  reports "I keep trying to reach out to my [facility] ACT[facility]. I understand that I am leaving them soon, but I still need help." Patient reports "there are only 3 people left and they can't handle [name] all." Patient reports that it is very hard for her to get in contact with someone and she has not seen a therapist in 3 months. Patient reports she is most stable when she has a therapist. Patient reports "I have no one to talk to. I have no friends. I do try to talk to my ex's but sometimes that ends in a fight because I have no one else to talk to except them." Patient denies that she is actively having SI and reports that she endorsed SI earlier because, " I need help and I needed to get here." Patient denies HI and AH patient is unsure if she is having VH and reports "maybe"' however she describes racing thoughts when attempting to describe [place]. Patient also endorses that she has only been sleeping 3-4 hours a night the past 4 nights, she has been more distracted, hyperactive, and anxious.   Psychiatric Specialty Exam  Presentation  General Appearance:Casual  Eye Contact:Minimal  Speech:Pressured  Speech Volume:Normal  Handedness:Right   Mood and Affect  Mood:Anxious  Affect:Congruent   Thought Process  Thought Processes:Coherent  Descriptions of Associations:Intact  Orientation:Full (Time, Place and Person)  Thought Content:Logical  Diagnosis of Schizophrenia or Schizoaffective disorder in past: No   Hallucinations:None  Ideas of Reference:None  Suicidal Thoughts:No ("I said I was because I need help and I needed to get here.") With Intent; With Plan With Intent; With Plan  Homicidal Thoughts:No Without Intent; Without Plan   Sensorium  Memory:Immediate Good; Recent Good; Remote Good  Judgment:Fair  Insight:Fair   Executive Functions  Concentration:Fair  Attention Span:Good  [place]  Language:Good   Psychomotor Activity  Psychomotor  Activity:Increased; Restlessness   Assets  Assets:Desire for Improvement; Communication Skills; Resilience; Housing   Sleep  Sleep:Poor  Number of hours:  No data recorded  No data recorded  Physical Exam: Physical Exam Constitutional:      Appearance: Normal appearance.  HENT:     Head: Normocephalic and atraumatic.     Nose: Nose normal.  Eyes:     Extraocular Movements: Extraocular movements intact.     Pupils: Pupils are equal, round, and reactive to light.  Cardiovascular:     Rate and Rhythm: Normal rate.     Pulses: Normal pulses.  Pulmonary:     Effort: Pulmonary effort is normal.  Musculoskeletal:        General: Normal range of motion.  Skin:    General: Skin is warm and dry.  Neurological:     General: No focal deficit present.     Mental Status: She is alert.   Review of Systems  Constitutional:  Negative for chills and fever.  HENT:  Negative for hearing loss.   Eyes:  Negative for blurred vision.  Respiratory:  Negative for cough and wheezing.   Cardiovascular:  Negative for chest pain.  Gastrointestinal:  Negative for abdominal pain.  Neurological:  Negative for dizziness.  Psychiatric/Behavioral:  Negative for suicidal ideas. The patient is nervous/anxious.   Blood pressure 102/72, pulse 87, temperature 97.8 F (36.6 C), temperature source Oral, resp. rate 16, SpO2 98 %. There is no height or weight on file to calculate BMI.  Musculoskeletal: Strength & Muscle Tone: within normal limits Gait & Station: normal Patient leans: N/A   [facility]UC MSE Discharge Disposition for Follow up and Recommendations: Based on my evaluation the patient does not appear to have an emergency medical condition and can be discharged with resources and follow up care in outpatient services for Individual Therapy Adjustment disorder, w/ anxiety Schizoaffective disorder, bipolar type currently hypomanic Patient appears to be having some trouble adjusting to the many  transitions happening in her life. Patient does appear to be making the effort to use the coping skill she has been taught to manage her stress; however patient also communicates that she feels so overwhelmed and needs someone to talk to about her emotions. Patient made it very clear that she is working hard to not be rehospitalzed and live a more responsible and productive life; however she is having trouble navigating some social aspects and is requesting assistance. Patient reports that if she has a therapist she will likely be more stable as patient indicated this has worked best for her in the past. Patient may be responding to her current stressors with a hypomanic episode as patient does appear to have pressured speech and less sleep; however patient is known to provider and patient does appear to be improved from last [facility] hospitalization and endorses continued compliance on her medication.  Provider and TTS spoke with patient's care coordinator and care coordinator reported that she would cal patient's CST [facility] and have them send a therapist to the patient's house today. Patient agreed with this plan and felt safe  for discharge. PGY-2 Freida Busman, MD 09/19/2020, 10:58 AM

## 2020-09-19 NOTE — Discharge Instructions (Signed)
If a therapist from [facility] does not reach out to you please call your care coordinator Otho. Continue to use your coping mechanisms.

## 2020-09-19 NOTE — BH Assessment (Signed)
Pt to [facility] with [facility] reporting passive SI this morning and AH that sound like a crowd of people. Pt reports being under a lot of stress from work, not sleeping well and reports she has not ate in 4 days. Pt reports she was fired from work today. Pt recently referred to CST from ACTT with [facility]. Pt denies SI, HI, VH and substance use at this time. Pt is tearful, tangential speech, and talking to herself in assessment room. Pt somewhat agitated.   Pt is urgent.
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
## 2020-09-19 NOTE — Discharge Summary (Signed)
[name] to be D/C'd Home per MD order. Discussed with the patient and all questions fully answered. An After Visit Summary was printed and given to the patient. Patient escorted out and D/C home via safe transport.  [name]  09/19/2020 11:16 AM

## 2020-10-04 DIAGNOSIS — G4733 Obstructive sleep apnea (adult) (pediatric): Secondary | ICD-10-CM | POA: Diagnosis not present

## 2020-10-11 DIAGNOSIS — Z6836 Body mass index (BMI) 36.0-36.9, adult: Secondary | ICD-10-CM | POA: Diagnosis not present

## 2020-10-11 DIAGNOSIS — L0390 Cellulitis, unspecified: Secondary | ICD-10-CM | POA: Diagnosis not present

## 2020-11-11 ENCOUNTER — Ambulatory Visit: Payer: PPO | Admitting: Neurology

## 2020-11-21 ENCOUNTER — Other Ambulatory Visit: Payer: Self-pay

## 2020-11-21 ENCOUNTER — Emergency Department (HOSPITAL_COMMUNITY)
Admission: EM | Admit: 2020-11-21 | Discharge: 2020-11-22 | Disposition: A | Discharge status: Home / Self Care | Payer: PPO | Attending: Emergency Medicine | Admitting: Emergency Medicine

## 2020-11-21 ENCOUNTER — Encounter (HOSPITAL_COMMUNITY): Payer: Self-pay

## 2020-11-21 DIAGNOSIS — I959 Hypotension, unspecified: Secondary | ICD-10-CM | POA: Diagnosis not present

## 2020-11-21 DIAGNOSIS — S59912A Unspecified injury of left forearm, initial encounter: Secondary | ICD-10-CM | POA: Diagnosis present

## 2020-11-21 DIAGNOSIS — Y900 Blood alcohol level of less than 20 mg/100 ml: Secondary | ICD-10-CM | POA: Insufficient documentation

## 2020-11-21 DIAGNOSIS — F4325 Adjustment disorder with mixed disturbance of emotions and conduct: Secondary | ICD-10-CM | POA: Diagnosis present

## 2020-11-21 DIAGNOSIS — Z23 Encounter for immunization: Secondary | ICD-10-CM | POA: Insufficient documentation

## 2020-11-21 DIAGNOSIS — T1491XA Suicide attempt, initial encounter: Secondary | ICD-10-CM

## 2020-11-21 DIAGNOSIS — S50812A Abrasion of left forearm, initial encounter: Secondary | ICD-10-CM | POA: Insufficient documentation

## 2020-11-21 DIAGNOSIS — F603 Borderline personality disorder: Secondary | ICD-10-CM | POA: Diagnosis present

## 2020-11-21 DIAGNOSIS — Z79899 Other long term (current) drug therapy: Secondary | ICD-10-CM | POA: Diagnosis not present

## 2020-11-21 DIAGNOSIS — R000 Tachycardia, unspecified: Secondary | ICD-10-CM | POA: Diagnosis not present

## 2020-11-21 DIAGNOSIS — F840 Autistic disorder: Secondary | ICD-10-CM | POA: Diagnosis not present

## 2020-11-21 DIAGNOSIS — N9489 Other specified conditions associated with female genital organs and menstrual cycle: Secondary | ICD-10-CM | POA: Insufficient documentation

## 2020-11-21 DIAGNOSIS — Z20822 Contact with and (suspected) exposure to covid-19: Secondary | ICD-10-CM | POA: Diagnosis not present

## 2020-11-21 DIAGNOSIS — R58 Hemorrhage, not elsewhere classified: Secondary | ICD-10-CM | POA: Diagnosis not present

## 2020-11-21 DIAGNOSIS — J45909 Unspecified asthma, uncomplicated: Secondary | ICD-10-CM | POA: Diagnosis not present

## 2020-11-21 DIAGNOSIS — X788XXA Intentional self-harm by other sharp object, initial encounter: Secondary | ICD-10-CM | POA: Insufficient documentation

## 2020-11-21 DIAGNOSIS — Z87891 Personal history of nicotine dependence: Secondary | ICD-10-CM | POA: Insufficient documentation

## 2020-11-21 DIAGNOSIS — R42 Dizziness and giddiness: Secondary | ICD-10-CM | POA: Diagnosis not present

## 2020-11-21 DIAGNOSIS — F250 Schizoaffective disorder, bipolar type: Secondary | ICD-10-CM | POA: Diagnosis present

## 2020-11-21 DIAGNOSIS — R45851 Suicidal ideations: Secondary | ICD-10-CM | POA: Diagnosis not present

## 2020-11-21 LAB — COMPREHENSIVE METABOLIC PANEL
ALT: 20 U/L (ref 0–44)
AST: 28 U/L (ref 15–41)
Albumin: 4 g/dL (ref 3.5–5.0)
Alkaline Phosphatase: 63 U/L (ref 38–126)
Anion gap: 10 (ref 5–15)
BUN: 10 mg/dL (ref 6–20)
CO2: 20 mmol/L — ABNORMAL LOW (ref 22–32)
Calcium: 9.1 mg/dL (ref 8.9–10.3)
Chloride: 106 mmol/L (ref 98–111)
Creatinine, Ser: 0.68 mg/dL (ref 0.44–1.00)
GFR, Estimated: >60 mL/min (ref >60)
Glucose, Bld: 95 mg/dL (ref 70–99)
Potassium: 4.2 mmol/L (ref 3.5–5.1)
Sodium: 136 mmol/L (ref 135–145)
Total Bilirubin: 0.6 mg/dL (ref 0.3–1.2)
Total Protein: 7.7 g/dL (ref 6.5–8.1)

## 2020-11-21 LAB — RESP PANEL BY RT-PCR (FLU A&B, COVID) ARPGX2
Influenza A by PCR: NEGATIVE
Influenza B by PCR: NEGATIVE
SARS Coronavirus 2 by RT PCR: NEGATIVE

## 2020-11-21 LAB — RAPID URINE DRUG SCREEN, HOSP PERFORMED
Amphetamines: NOT DETECTED
Barbiturates: NOT DETECTED
Benzodiazepines: NOT DETECTED
Cocaine: NOT DETECTED
Opiates: NOT DETECTED
Tetrahydrocannabinol: NOT DETECTED

## 2020-11-21 LAB — CBC WITH DIFFERENTIAL/PLATELET
Abs Immature Granulocytes: 0.05 10*3/uL (ref 0.00–0.07)
Basophils Absolute: 0 10*3/uL (ref 0.0–0.1)
Basophils Relative: 0 %
Eosinophils Absolute: 0 10*3/uL (ref 0.0–0.5)
Eosinophils Relative: 0 %
HCT: 38 % (ref 36.0–46.0)
Hemoglobin: 12 g/dL (ref 12.0–15.0)
Immature Granulocytes: 1 %
Lymphocytes Relative: 21 %
Lymphs Abs: 2.1 10*3/uL (ref 0.7–4.0)
MCH: 26.2 pg (ref 26.0–34.0)
MCHC: 31.6 g/dL (ref 30.0–36.0)
MCV: 83 fL (ref 80.0–100.0)
Monocytes Absolute: 0.6 10*3/uL (ref 0.1–1.0)
Monocytes Relative: 6 %
Neutro Abs: 7 10*3/uL (ref 1.7–7.7)
Neutrophils Relative %: 72 %
Platelets: 231 10*3/uL (ref 150–400)
RBC: 4.58 MIL/uL (ref 3.87–5.11)
RDW: 14.1 % (ref 11.5–15.5)
WBC: 9.8 10*3/uL (ref 4.0–10.5)
nRBC: 0 % (ref 0.0–0.2)

## 2020-11-21 LAB — I-STAT BETA HCG BLOOD, ED (MC, WL, AP ONLY): I-stat hCG, quantitative: <5 m[IU]/mL (ref <5)

## 2020-11-21 LAB — SALICYLATE LEVEL: Salicylate Lvl: <7 mg/dL — ABNORMAL LOW (ref 7.0–30.0)

## 2020-11-21 LAB — ACETAMINOPHEN LEVEL: Acetaminophen (Tylenol), Serum: <10 ug/mL — ABNORMAL LOW (ref 10–30)

## 2020-11-21 LAB — ETHANOL: Alcohol, Ethyl (B): <10 mg/dL (ref <10)

## 2020-11-21 LAB — CBG MONITORING, ED: Glucose-Capillary: 90 mg/dL (ref 70–99)

## 2020-11-21 MED ORDER — STERILE WATER FOR INJECTION IJ SOLN
INTRAMUSCULAR | Status: AC
  Filled 2020-11-21: qty 10

## 2020-11-21 MED ORDER — DIPHENHYDRAMINE HCL 50 MG/ML IJ SOLN
50.0000 mg | Freq: Once | INTRAMUSCULAR | Status: DC
  Filled 2020-11-21: qty 1

## 2020-11-21 MED ORDER — IBUPROFEN 800 MG PO TABS
800.0000 mg | ORAL_TABLET | Freq: Once | ORAL | Status: AC
  Administered 2020-11-21: 800 mg via ORAL
  Filled 2020-11-21: qty 1

## 2020-11-21 MED ORDER — LORAZEPAM 2 MG/ML IJ SOLN
2.0000 mg | Freq: Once | INTRAMUSCULAR | Status: DC
  Filled 2020-11-21: qty 1

## 2020-11-21 MED ORDER — BACITRACIN ZINC 500 UNIT/GM EX OINT
1.0000 "application " | TOPICAL_OINTMENT | Freq: Two times a day (BID) | CUTANEOUS | Status: DC
  Administered 2020-11-21 – 2020-11-22 (×3): 1 via TOPICAL
  Filled 2020-11-21 (×3): qty 0.9

## 2020-11-21 MED ORDER — TETANUS-DIPHTH-ACELL PERTUSSIS 5-2.5-18.5 LF-MCG/0.5 IM SUSY
0.5000 mL | PREFILLED_SYRINGE | Freq: Once | INTRAMUSCULAR | Status: AC
  Administered 2020-11-21: 0.5 mL via INTRAMUSCULAR
  Filled 2020-11-21: qty 0.5

## 2020-11-21 MED ORDER — OLANZAPINE 10 MG IM SOLR
10.0000 mg | Freq: Once | INTRAMUSCULAR | Status: DC
  Filled 2020-11-21: qty 10

## 2020-11-21 NOTE — BH Assessment (Signed)
@  0750, Clinician sent secure chat to patient's nurse [name], RN); requesting to complete patient's initial TTS.  Per nursing, patient is sleeping and unable to be seen by TTS at this time. Clinician requesting nursing to notify TTS when patient is ready to be seen.

## 2020-11-21 NOTE — ED Triage Notes (Signed)
Pt has multiple superficial wounds to the left forearm
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
## 2020-11-21 NOTE — BH Assessment (Signed)
[facility] Assessment Progress Note   Per Elmarie Shiley, NP, this voluntary pt requires psychiatric hospitalization at this time.  Dimple Nanas, RN, [facility] at [facility] reports that they will not have an appropriate bed for pt today.  At the direction of Hampton Abbot, MD this writer has sought placement for pt outside of the [facility] system.The following facilities have been contacted to seek placement for this pt, with results as noted:  Beds available, information sent, decision pending: [facility]  At capacity: [facility]  If this voluntary pt is accepted to a facility, please discuss disposition with pt to be sure that she agrees to the plan.  If a facility agrees to accept pt and the plan changes in any way please call the facility to inform them of the change.  Final disposition is pending as of this writing.  [name], [place] Coordinator [phone]

## 2020-11-21 NOTE — ED Provider Notes (Signed)
Patient has been coming increasingly agitated.  Has been hostile to staff yelling and screaming and striking objects in the department.  Verbal de-escalation with some improvement but has erratic behavior.  Will chemically sedate.   Deno Etienne, DO 11/21/20 1416

## 2020-11-21 NOTE — ED Provider Notes (Signed)
[name] Provider Note   CSN: [id number] Arrival date & time: 11/21/20  0244     History Chief Complaint  Patient presents with   Suicide Attempt    Pt cut her left arm with sissors, wanted to die, had an argument with brother and father, said that her husband died in May 07, 2016 due to suicide. Every thing is weighing on her    [name] is a 30 y.o. female.  Patient presents to the emergency department with a chief complaint of suicide attempt.  She states that she cut her left wrist with scissors in an attempt to kill her self.  She reports that she had an argument with her brother and father.  She reports that her husband died in 07-May-2016 due to suicide.  She states that she is under a lot of stress.  She denies any drug or alcohol use.  Denies any other associated symptoms.  Bleeding is controlled with gauze prior to arrival.  The history is provided by the patient. No language interpreter was used.      Past Medical History:  Diagnosis Date   Acid reflux    Anxiety    Asthma    last attack 03/13/15 or 03/14/15   Autism    Carrier of fragile X syndrome    Chronic constipation    Depression    Drug-seeking behavior    Essential tremor    Headache    Overdose of acetaminophen 07/2017   and other meds   Personality disorder ([place])    Schizo-affective psychosis ([place])    Schizoaffective disorder, bipolar type ([place])    Seizures ([place])    Last seizure December 2017   Sleep apnea     Patient Active Problem List   Diagnosis Date Noted   Schizoaffective disorder ([place]) 02/04/2020   MDD (major depressive disorder), recurrent episode, severe ([place]) 01/17/2020   Adjustment disorder with mixed disturbance of emotions and conduct 08/03/2019   Overdose 07/22/2017   Intentional acetaminophen overdose ([place])    DUB (dysfunctional uterine bleeding) 11/22/2016   Hyperprolactinemia ([place]) 08/20/2016   Tachycardia 01/13/2016   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 09/08/2015   Seizure disorder ([place]) 08/08/2015   Chronic migraine 07/27/2015   Asthma 04/15/2015   Schizoaffective disorder, bipolar type ([place]) 03/10/2014   PTSD (post-traumatic stress disorder) 03/10/2014   Suicidal ideation    Borderline personality disorder ([place]) 10/31/2013   Autism spectrum disorder 06/15/2013    Past Surgical History:  Procedure Laterality Date   MOUTH SURGERY  05/08/07 or 05/07/2008     OB History     Gravida  2   Para  1   Term  1   Preterm      AB  1   Living  1      SAB  1   IAB      Ectopic      Multiple  0   Live Births  1           Family History  Problem Relation Age of Onset   Mental illness Father    Asthma Father    PDD Brother    Seizures Brother     Social History   Tobacco Use   Smoking status: Former    Packs/day: 0.00    Types: Cigarettes   Smokeless tobacco: Never   Tobacco comments:    Smoked for 2  years age [age]  Vaping Use   Vaping Use: Never  used  Substance Use Topics   Alcohol use: No    Alcohol/week: 1.0 standard drink    Types: 1 Standard drinks or equivalent per week    Comment: denies at this time   Drug use: No    Comment: History of cocaine use at age 89 for 4 months    Home Medications Prior to Admission medications   Medication Sig Start Date End Date Taking? Authorizing Provider  albuterol (PROVENTIL HFA;VENTOLIN HFA) 108 (90 Base) MCG/ACT inhaler Inhale 1-2 puffs into the lungs every 6 (six) hours as needed for wheezing or shortness of breath.    [provider]  melatonin 5 MG TABS Take 5 mg by mouth at bedtime as needed (sleep).    [provider]  pantoprazole (PROTONIX) 40 MG tablet Take 40 mg by mouth daily. 08/15/20   [provider]  risperiDONE (RISPERDAL) 1 MG tablet Take 1 mg by mouth every morning. 04/22/20   [provider]  risperiDONE (RISPERDAL) 3 MG tablet Take 1 tablet (3 mg total) by mouth at bedtime. 02/08/20   Freida Busman, MD   sertraline (ZOLOFT) 50 MG tablet Take 3 tablets (150 mg total) by mouth daily. 02/09/20   Freida Busman, MD  SUMAtriptan (IMITREX) 50 MG tablet TAKE 1 TABLET BY MOUTH AT ONSET OF MIGRAINE *MAY REPEAT IN 2 HOURS IF HEADACHE PERSISTS MAX 2 TABLET /24 HOURS 06/21/20   Pieter Partridge, DO    Allergies    Bee venom, Coconut flavor, Geodon [ziprasidone hcl], Haloperidol and related, Lithium, Oxycodone, Quetiapine, Seroquel [quetiapine fumarate], Shellfish allergy, Phenergan [promethazine hcl], Prilosec [omeprazole], Sulfa antibiotics, Tegretol [carbamazepine], Prozac [fluoxetine], and Tape  Review of Systems   Review of Systems  All other systems reviewed and are negative.  Physical Exam Updated Vital Signs BP 121/69 (BP Location: Right Arm)   Pulse 99   Temp 98.2 F (36.8 C) (Oral)   Resp 18   Ht 5\' 4"  (1.626 m)   Wt 94.8 kg   LMP  (LMP Unknown)   SpO2 100%   BMI 35.87 kg/m   Physical Exam Vitals and nursing note reviewed.  Constitutional:      General: She is not in acute distress.    Appearance: She is well-developed.  HENT:     Head: Normocephalic and atraumatic.  Eyes:     Conjunctiva/sclera: Conjunctivae normal.  Cardiovascular:     Rate and Rhythm: Normal rate and regular rhythm.     Heart sounds: No murmur heard. Pulmonary:     Effort: Pulmonary effort is normal. No respiratory distress.     Breath sounds: Normal breath sounds.  Abdominal:     Palpations: Abdomen is soft.     Tenderness: There is no abdominal tenderness.  Musculoskeletal:     Cervical back: Neck supple.  Skin:    General: Skin is warm and dry.     Comments: Superficial abrasions to left forearm, no lacerations requiring primary repair  Neurological:     Mental Status: She is alert and oriented to person, place, and time.  Psychiatric:        Mood and Affect: Mood normal.        Behavior: Behavior normal.    ED Results / Procedures / Treatments   Labs (all labs ordered are listed, but only  abnormal results are displayed) Labs Reviewed  RESP PANEL BY RT-PCR (FLU A&B, COVID) ARPGX2  COMPREHENSIVE METABOLIC PANEL  SALICYLATE LEVEL  ACETAMINOPHEN LEVEL  ETHANOL  RAPID URINE  DRUG SCREEN, HOSP PERFORMED  CBC WITH DIFFERENTIAL/PLATELET  I-STAT BETA HCG BLOOD, ED ([facility], [facility], [facility] ONLY)  CBG MONITORING, ED  I-STAT BETA HCG BLOOD, ED ([facility], [facility], [facility] ONLY)    EKG None  Radiology No results found.  Procedures Procedures   Medications Ordered in ED Medications  Tdap (BOOSTRIX) injection 0.5 mL (has no administration in time range)  bacitracin ointment 1 application (has no administration in time range)    ED Course  I have reviewed the triage vital signs and the nursing notes.  Pertinent labs & imaging results that were available during my care of the patient were reviewed by me and considered in my medical decision making (see chart for details).    MDM Rules/Calculators/A&P                           Patient is a 30 year old female with a suicide attempt.  She cut her wrist multiple times with scissors.  None of the wounds are deep or require repair.  Will consult TTS for evaluation.  5:38 AM Patient appears medically clear for TTS evaluation.  Dispo pending TTS. Final Clinical Impression(s) / ED Diagnoses Final diagnoses:  Suicide attempt [facility])    Rx / [place] Orders ED Discharge Orders     None        Montine Circle, PA-C 11/21/20 3295    Merrily Pew, MD 11/21/20 [phone]

## 2020-11-22 DIAGNOSIS — S50812A Abrasion of left forearm, initial encounter: Secondary | ICD-10-CM | POA: Diagnosis not present

## 2020-11-22 DIAGNOSIS — F603 Borderline personality disorder: Secondary | ICD-10-CM

## 2020-11-22 MED ORDER — SERTRALINE HCL 50 MG PO TABS
150.0000 mg | ORAL_TABLET | Freq: Every day | ORAL | Status: DC
Start: 2020-11-22 — End: 2020-11-22
  Administered 2020-11-22: 150 mg via ORAL
  Filled 2020-11-22: qty 3

## 2020-11-22 MED ORDER — PANTOPRAZOLE SODIUM 40 MG PO TBEC
40.0000 mg | DELAYED_RELEASE_TABLET | Freq: Every day | ORAL | Status: DC
  Administered 2020-11-22: 40 mg via ORAL
  Filled 2020-11-22: qty 1

## 2020-11-22 MED ORDER — RISPERIDONE 2 MG PO TABS: 3.0000 mg | ORAL_TABLET | Freq: Every day | ORAL | Status: DC

## 2020-11-22 MED ORDER — MELATONIN 3 MG PO TABS: 3.0000 mg | ORAL_TABLET | Freq: Every evening | ORAL | Status: DC | PRN

## 2020-11-22 NOTE — ED Notes (Signed)
Md has placed IVC orders on patient and now we will medicate with prescribed medication

## 2020-11-22 NOTE — ED Notes (Signed)
Pt in hall scratching self, said  that she has not eaten all day, she is cold and she is carrying on a conversation with herself.

## 2020-11-22 NOTE — ED Notes (Signed)
Pt care taken, pt in hall talking to sitter

## 2020-11-22 NOTE — ED Provider Notes (Signed)
Patient continues to be agitated and uncooperative with staff.  She continues to engage in self-injurious behavior.  She has refused psychiatric medication.  I have placed her under involuntary commitment so that further treatment may proceed.   Kerith Sherley, Jenny Reichmann, MD 11/22/20 [phone]

## 2020-11-22 NOTE — ED Notes (Signed)
Sitter at bedside.

## 2020-11-22 NOTE — BH Assessment (Signed)
[place] Assessment Progress Note   Per Sheran Fava, NP, this pt does not require psychiatric hospitalization at this time.  Pt presents under IVC initiated by EDP Lorenza Cambridge, MD which has been rescinded by Hampton Abbot, MD.  Pt is psychiatrically cleared.  Discharge instructions advise pt to continue treatment with the [name] ACT Team in anticipation of stepping down to the [employer] on 12/23/2020.  At 12:10 I spoke to Janae Bridgeman with the [facility] Team.  She reports that no one will be able to pick pt up at [facility], but she adds that pt [place] capable of [place].  EDP Deno Etienne and pt's nurses, Domingo Dimes and [place], have been notified.  [name], [place] Triage Specialist [phone]
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
## 2020-11-22 NOTE — Discharge Instructions (Addendum)
For your behavioral health needs you are advised to continue treatment with the [facility] ACT Team in anticipation of transitioning to the [place] Team:       [facility] Team      [address] [place]      [place], [place] [address]      Office: [phone]      Crisis: [phone]

## 2020-12-01 ENCOUNTER — Ambulatory Visit (HOSPITAL_COMMUNITY)
Admission: EM | Admit: 2020-12-01 | Discharge: 2020-12-03 | Disposition: A | Discharge status: Other Acute Inpatient Hospital | Payer: PPO | Attending: Nurse Practitioner | Admitting: Nurse Practitioner

## 2020-12-01 DIAGNOSIS — Z20822 Contact with and (suspected) exposure to covid-19: Secondary | ICD-10-CM | POA: Diagnosis not present

## 2020-12-01 DIAGNOSIS — Z888 Allergy status to other drugs, medicaments and biological substances status: Secondary | ICD-10-CM | POA: Diagnosis not present

## 2020-12-01 DIAGNOSIS — F250 Schizoaffective disorder, bipolar type: Secondary | ICD-10-CM | POA: Diagnosis not present

## 2020-12-01 DIAGNOSIS — F17210 Nicotine dependence, cigarettes, uncomplicated: Secondary | ICD-10-CM | POA: Insufficient documentation

## 2020-12-01 DIAGNOSIS — Z882 Allergy status to sulfonamides status: Secondary | ICD-10-CM | POA: Diagnosis not present

## 2020-12-01 DIAGNOSIS — R45851 Suicidal ideations: Secondary | ICD-10-CM | POA: Diagnosis not present

## 2020-12-01 DIAGNOSIS — Z9151 Personal history of suicidal behavior: Secondary | ICD-10-CM | POA: Insufficient documentation

## 2020-12-01 DIAGNOSIS — Z885 Allergy status to narcotic agent status: Secondary | ICD-10-CM | POA: Insufficient documentation

## 2020-12-01 DIAGNOSIS — F333 Major depressive disorder, recurrent, severe with psychotic symptoms: Secondary | ICD-10-CM | POA: Diagnosis present

## 2020-12-01 DIAGNOSIS — Z91030 Bee allergy status: Secondary | ICD-10-CM | POA: Insufficient documentation

## 2020-12-01 DIAGNOSIS — F603 Borderline personality disorder: Secondary | ICD-10-CM | POA: Insufficient documentation

## 2020-12-01 DIAGNOSIS — F419 Anxiety disorder, unspecified: Secondary | ICD-10-CM | POA: Insufficient documentation

## 2020-12-01 LAB — POC SARS CORONAVIRUS 2 AG: SARSCOV2ONAVIRUS 2 AG: NEGATIVE

## 2020-12-01 MED ORDER — ALBUTEROL SULFATE HFA 108 (90 BASE) MCG/ACT IN AERS: 2.0000 | INHALATION_SPRAY | Freq: Four times a day (QID) | RESPIRATORY_TRACT | Status: DC | PRN

## 2020-12-01 MED ORDER — HYDROXYZINE HCL 25 MG PO TABS
25.0000 mg | ORAL_TABLET | Freq: Three times a day (TID) | ORAL | Status: DC | PRN
  Administered 2020-12-01 – 2020-12-02 (×2): 25 mg via ORAL
  Filled 2020-12-01 (×2): qty 1

## 2020-12-01 MED ORDER — ACETAMINOPHEN 325 MG PO TABS
650.0000 mg | ORAL_TABLET | Freq: Four times a day (QID) | ORAL | Status: DC | PRN
  Administered 2020-12-02 (×2): 650 mg via ORAL
  Filled 2020-12-01 (×2): qty 2

## 2020-12-01 MED ORDER — RISPERIDONE 3 MG PO TABS
3.0000 mg | ORAL_TABLET | Freq: Every day | ORAL | Status: DC
  Administered 2020-12-02: 3 mg via ORAL
  Filled 2020-12-01: qty 1

## 2020-12-01 MED ORDER — ALUM & MAG HYDROXIDE-SIMETH 200-200-20 MG/5ML PO SUSP: 30.0000 mL | ORAL | Status: DC | PRN

## 2020-12-01 MED ORDER — LORAZEPAM 1 MG PO TABS
2.0000 mg | ORAL_TABLET | Freq: Two times a day (BID) | ORAL | Status: DC | PRN
  Administered 2020-12-02 (×2): 2 mg via ORAL
  Filled 2020-12-01 (×2): qty 2

## 2020-12-01 MED ORDER — TRAZODONE HCL 50 MG PO TABS
50.0000 mg | ORAL_TABLET | Freq: Every evening | ORAL | Status: DC | PRN
  Filled 2020-12-01 (×2): qty 1

## 2020-12-01 MED ORDER — SERTRALINE HCL 50 MG PO TABS
150.0000 mg | ORAL_TABLET | Freq: Once | ORAL | Status: DC
  Filled 2020-12-01: qty 1

## 2020-12-01 MED ORDER — MAGNESIUM HYDROXIDE 400 MG/5ML PO SUSP: 30.0000 mL | Freq: Every day | ORAL | Status: DC | PRN
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
## 2020-12-02 DIAGNOSIS — F250 Schizoaffective disorder, bipolar type: Secondary | ICD-10-CM | POA: Diagnosis not present

## 2020-12-02 LAB — URINALYSIS, COMPLETE (UACMP) WITH MICROSCOPIC
Bilirubin Urine: NEGATIVE
Glucose, UA: NEGATIVE mg/dL
Hgb urine dipstick: NEGATIVE
Ketones, ur: NEGATIVE mg/dL
Nitrite: NEGATIVE
Protein, ur: NEGATIVE mg/dL
Specific Gravity, Urine: 1.015 (ref 1.005–1.030)
pH: 5 (ref 5.0–8.0)

## 2020-12-02 LAB — POCT URINE DRUG SCREEN - MANUAL ENTRY (I-SCREEN)
POC Amphetamine UR: NOT DETECTED
POC Buprenorphine (BUP): NOT DETECTED
POC Cocaine UR: NOT DETECTED
POC Marijuana UR: NOT DETECTED
POC Methadone UR: NOT DETECTED
POC Methamphetamine UR: NOT DETECTED
POC Morphine: NOT DETECTED
POC Oxazepam (BZO): NOT DETECTED
POC Oxycodone UR: NOT DETECTED
POC Secobarbital (BAR): NOT DETECTED

## 2020-12-02 LAB — RESP PANEL BY RT-PCR (FLU A&B, COVID) ARPGX2
Influenza A by PCR: NEGATIVE
Influenza B by PCR: NEGATIVE
SARS Coronavirus 2 by RT PCR: NEGATIVE

## 2020-12-02 LAB — POCT PREGNANCY, URINE: Preg Test, Ur: NEGATIVE

## 2020-12-02 LAB — PREGNANCY, URINE: Preg Test, Ur: NEGATIVE

## 2020-12-02 MED ORDER — MELATONIN 3 MG PO TABS
3.0000 mg | ORAL_TABLET | Freq: Every day | ORAL | Status: DC
  Administered 2020-12-02: 3 mg via ORAL
  Filled 2020-12-02: qty 1

## 2020-12-02 MED ORDER — SERTRALINE HCL 50 MG PO TABS
150.0000 mg | ORAL_TABLET | Freq: Every day | ORAL | Status: DC
  Administered 2020-12-02 – 2020-12-03 (×2): 150 mg via ORAL
  Filled 2020-12-02 (×2): qty 3

## 2020-12-02 NOTE — ED Notes (Signed)
Patient reported having SI thoughts requested to speak with someone. Patient returned to sleep but spoke with nurse.

## 2020-12-02 NOTE — Progress Notes (Signed)
Patient asked for and was given 25mg  vistaril for anxiety and 650mg  tylenol PO PRN for headache pain number 6.  She remains dysphoric with poor eye contact and depressed mood.  She continues to endorse passive suicidal ideation with plan to OD on nonspecific pills.

## 2020-12-02 NOTE — ED Notes (Signed)
Patient remains asleep in bed without distress or complaint.  Will monitor and provide safe supportive environment.

## 2020-12-02 NOTE — Progress Notes (Signed)
Patient irritable with passive suicidal ideas stating "I just want to go to sleep and not wake up."  Patient demanding and attention seeking.  Banging on nursing station glass.  Difficult to redirect.  Zoloft 150mg  given.  Patient was attempting to scratch open an old wound bandage placed over area to assist with prevention of self harm.  Patient is now lying on her bed crying into her pillow.  Support attempted and rebuked.  Will monitor and provide safe environment.

## 2020-12-02 NOTE — ED Notes (Signed)
Pt in recliner asleep.  Breathing is unlabored and even. Will continue to monitor for safety.

## 2020-12-02 NOTE — ED Notes (Signed)
Patient refusing vitals at this time.

## 2020-12-02 NOTE — ED Notes (Signed)
Vitals refused

## 2020-12-02 NOTE — ED Notes (Signed)
Patient refused vitals.

## 2020-12-02 NOTE — Progress Notes (Signed)
Patient states she takes zoloft in the morning however nothing is ordered for her.  Provider notified.  She continues to report passive s/I and was picking at an old scab.  bandage placed on it and will monitor for self injurious behavior.

## 2020-12-02 NOTE — ED Provider Notes (Addendum)
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    Flowsheet Row Admission (Discharged)  from 02/04/2020 in [place] [facility] Admission (Discharged) from 01/17/2020 in [place] [facility] Admission (Discharged) from 07/08/2019 in [place] [facility] Admission (Discharged) from 07/01/2019 in [place] [facility] Admission  (Discharged) from 03/05/2019 in [place] [facility]  AIMS Total Score 0 0 0 0 0      AUDIT    Flowsheet Row Admission (Discharged) from 02/04/2020 in [place] [facility] Admission (Discharged) from 01/17/2020 in [place] [facility] Admission (Discharged) from 07/08/2019 in [place] [facility] Admission (Discharged) from 07/01/2019 in [place] [facility] Admission (Discharged) from 03/05/2019 in [place] 300B  Alcohol Use Disorder Identification Test Final Score (AUDIT) 0 0 0 0 0      GAD-7    Flowsheet Row Office Visit from 08/20/2016 in [place] for [place] from 04/03/2016 in [place] for [facility] Routine Prenatal from 03/14/2016 in [place] for [facility] Routine Prenatal from 03/08/2016 in [place] for [facility] Routine Prenatal from 03/01/2016 in [place] for [facility]  Total GAD-7 Score 1 19 13 9 5       PHQ2-9    [facility] ED from 02/03/2020 in [facility] from 07/26/2019 in [facility] Office Visit from 08/20/2016 in [place] for [place] from 04/03/2016 in [place] for [facility] Routine Prenatal from 03/14/2016 in [place] for [facility]  PHQ-2 Total Score 6 5 0 3 2  PHQ-9 Total Score 27 15 1 16 8       [place] ED from 12/01/2020 in [facility] ED from 11/21/2020 in [place] [facility]PT ED from 09/19/2020 in [place] CATEGORY High Risk High Risk Low Risk        Musculoskeletal  Strength & Muscle Tone: within normal limits Gait & Station: normal Patient leans:  N/A  Psychiatric Specialty Exam  Presentation  General Appearance: Appropriate for Environment  Eye Contact:Fair  Speech:Clear and Coherent  Speech Volume:Normal  Handedness:Right   Mood and Affect  Mood:Depressed  Affect:Congruent   Thought Process  Thought Processes:Coherent  Descriptions of Associations:Circumstantial  Orientation:Full (Time, Place and Person)  Thought Content:Scattered  Diagnosis of Schizophrenia or Schizoaffective disorder in past: No    Hallucinations:Hallucinations: None  Ideas of Reference:None  Suicidal Thoughts:Suicidal Thoughts: Yes, Active SI Active Intent and/or Plan: With Intent; With Plan  Homicidal Thoughts:Homicidal Thoughts: No   Sensorium  Memory:Immediate Fair; Remote Fair; Recent Fair  Judgment:Impaired  Insight:Lacking   Executive Functions  Concentration:Fair  Attention Span:Fair  [place]   Psychomotor Activity  Psychomotor Activity:Psychomotor Activity: Normal   Assets  Assets:Communication Skills; Desire for Improvement; Financial Resources/Insurance; Housing; Leisure Time; Physical Health   Sleep  Sleep:Sleep: Fair   Nutritional Assessment (For OBS and FBC admissions only) Has the patient had a weight loss or gain of 10 pounds or more in the last 3 months?: No Has the patient had a decrease in food intake/or appetite?: No Does the patient have dental problems?: No Does the patient have eating habits or behaviors that may be indicators of an eating disorder including binging or inducing vomiting?: No Has the patient recently lost weight without trying?: 0 Has the patient been eating poorly because  of a decreased appetite?: 0 Malnutrition Screening Tool Score: 0    Physical Exam  Physical Exam Constitutional:      Appearance: Normal appearance.  HENT:     Head: Normocephalic.     Nose: Nose normal.  Eyes:     Conjunctiva/sclera: Conjunctivae  normal.  Cardiovascular:     Rate and Rhythm: Normal rate.  Pulmonary:     Effort: Pulmonary effort is normal.  Musculoskeletal:        General: Normal range of motion.     Cervical back: Normal range of motion.  Neurological:     Mental Status: She is alert and oriented to person, place, and time.   Review of Systems  Constitutional: Negative.   HENT: Negative.    Eyes: Negative.   Respiratory: Negative.    Cardiovascular: Negative.   Gastrointestinal: Negative.   Genitourinary: Negative.   Musculoskeletal: Negative.   Skin: Negative.   Neurological: Negative.   Endo/Heme/Allergies: Negative.   Psychiatric/Behavioral:  Positive for suicidal ideas.   Blood pressure 119/81, pulse (!) 111, temperature 98.6 F (37 C), temperature source Oral, resp. rate 18, SpO2 97 %. There is no height or weight on file to calculate BMI.  Treatment Plan Summary: Patient is recommended for inpatient psych treatment. Patient admitted voluntarily to the [facility] to await placement. Patient has been faxed out for placement by the psychiatry team       Meds ordered this encounter  Medications   acetaminophen (TYLENOL) tablet 650 mg   alum & mag hydroxide-simeth (MAALOX/MYLANTA) 200-200-20 MG/5ML suspension 30 mL   magnesium hydroxide (MILK OF MAGNESIA) suspension 30 mL   traZODone (DESYREL) tablet 50 mg   risperiDONE (RISPERDAL) tablet 3 mg   albuterol (VENTOLIN HFA) 108 (90 Base) MCG/ACT inhaler 2 puff   sertraline (ZOLOFT) tablet 150 mg   LORazepam (ATIVAN) tablet 2 mg   hydrOXYzine (ATARAX/VISTARIL) tablet 25 mg   melatonin tablet 3 mg    Scheduled Meds:  melatonin  3 mg Oral QHS   risperiDONE  3 mg Oral QHS   sertraline  150 mg Oral Daily   traZODone  50 mg Oral QHS,MR X 1    PRN Meds:.acetaminophen, albuterol, alum & mag hydroxide-simeth, hydrOXYzine, LORazepam, magnesium hydroxide    Rathana Viveros L, NP 12/02/2020 10:27 AM

## 2020-12-02 NOTE — ED Notes (Signed)
Patient resting with eyes closed  - no sxs of distress noted

## 2020-12-02 NOTE — Progress Notes (Signed)
Inpatient Behavioral Health Placement  Pt meets inpatient criteria per [facility]. No appropriate beds at [facility]. Referral was sent to the following facilities;   Destination Service Provider Address Phone Fax  [address] [place] [address] [phone]  [facility]  [address] [place] [address] [phone] [place] [address] [phone] [facility]  [place], [place] [address] [phone]  [facility]  [address] [place] [address] [phone] [facility]  [address], [place] [address] [phone]  [facility]  [address], [place] [address] [phone] [place]  [address] [place] [address] [phone] [place]  [address] [place] [address] [phone] [facility]  [address] [place] [address] [phone] [place]  [address] [place] [address] [phone] [facility]  [address] [place] [address] [phone] [facility]  [address], [place] [address] [place]  [place], [place] [address] [phone]  [facility]  [address], [place] [address] [phone]  [facility]  [address] [place] [address]      Situation ongoing,  CSW will follow up.   Benjaman Kindler, MSW, [facility] 12/02/2020  @ 9:47 PM

## 2020-12-02 NOTE — ED Notes (Signed)
Patient wants to sleep now and do EKG later

## 2020-12-02 NOTE — Progress Notes (Signed)
Refused EKG or bloodwork

## 2020-12-02 NOTE — ED Notes (Signed)
Patient brought to the unit - asking about sleep med she takes at home - provider notified - no sxs of distress noted - will continue to monitor for safety

## 2020-12-02 NOTE — Progress Notes (Signed)
Pt was accept to [facility] tomorrow 12/03/20 after 0800am.  Pt meets inpatient criteria per Darrol Angel, NP  Attending Physician will be Dr. Jonelle Sports  Report can be called to: [phone]  Pt can arrive after 0800am on 12/03/20  Care Team notified via secure chat and requested to coordinate transportation:Patrice White, NP, and Paige Selke,LPN.   Nadara Mode, LCSWA 12/02/2020 @ 10:09 PM

## 2020-12-02 NOTE — ED Notes (Signed)
Pt sitting quietly in recliner. Pt endorses SI with no plan.  She also endorses HI towards one person but would not disclose who that person is.  Reports a headache that has improved with prn medication.  Breathing is even and unlabored.  Will continue to monitor for safety.

## 2020-12-02 NOTE — ED Notes (Signed)
Unsuccessful attempt at lab work - no other nurse available in building will pass along to day shift staff

## 2020-12-03 DIAGNOSIS — Z9151 Personal history of suicidal behavior: Secondary | ICD-10-CM | POA: Diagnosis not present

## 2020-12-03 DIAGNOSIS — J45909 Unspecified asthma, uncomplicated: Secondary | ICD-10-CM | POA: Diagnosis not present

## 2020-12-03 DIAGNOSIS — F603 Borderline personality disorder: Secondary | ICD-10-CM | POA: Diagnosis not present

## 2020-12-03 DIAGNOSIS — G40909 Epilepsy, unspecified, not intractable, without status epilepticus: Secondary | ICD-10-CM | POA: Diagnosis not present

## 2020-12-03 DIAGNOSIS — Z91419 Personal history of unspecified adult abuse: Secondary | ICD-10-CM | POA: Diagnosis not present

## 2020-12-03 DIAGNOSIS — R45851 Suicidal ideations: Secondary | ICD-10-CM | POA: Diagnosis not present

## 2020-12-03 DIAGNOSIS — K219 Gastro-esophageal reflux disease without esophagitis: Secondary | ICD-10-CM | POA: Diagnosis not present

## 2020-12-03 DIAGNOSIS — F250 Schizoaffective disorder, bipolar type: Secondary | ICD-10-CM | POA: Diagnosis not present

## 2020-12-03 DIAGNOSIS — Q992 Fragile X chromosome: Secondary | ICD-10-CM | POA: Diagnosis not present

## 2020-12-03 DIAGNOSIS — Z62811 Personal history of psychological abuse in childhood: Secondary | ICD-10-CM | POA: Diagnosis not present

## 2020-12-03 DIAGNOSIS — F840 Autistic disorder: Secondary | ICD-10-CM | POA: Diagnosis not present

## 2020-12-03 DIAGNOSIS — Z62810 Personal history of physical and sexual abuse in childhood: Secondary | ICD-10-CM | POA: Diagnosis not present

## 2020-12-03 NOTE — ED Notes (Signed)
Pt resting quietly with eyes closed.  No pain or discomfort noted/voiced.  Breathing is even and unlabored.  Will continue to monitor for safety.

## 2020-12-03 NOTE — ED Notes (Signed)
GIVEN BREAKFAST

## 2020-12-03 NOTE — Discharge Instructions (Signed)
Patient accepted to [facility]

## 2020-12-03 NOTE — ED Notes (Signed)
Report provided to Verdene Lennert RN at [facility]. Safe transport called also. Veronica RN requested that Zoloft be admin prior to d/c from facility. Will continue to monitor, safety maintained.
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
## 2020-12-03 NOTE — ED Notes (Signed)
[place] to give report, unable to speak with the nurse at this time. Left message per the request of [facility] staff. Awaiting return call for report to be provided.

## 2020-12-03 NOTE — ED Notes (Signed)
Discharge instructions provided and Pt stated understanding. Pt alert, orient and ambulatory prior to d/c from facility. Personal belongings returned from locker. Safe transport called for transportation services. Pt escorted to the [place]ally port. Safety maintained. Report previously called to Database administrator at [facility].

## 2020-12-04 DIAGNOSIS — Z62811 Personal history of psychological abuse in childhood: Secondary | ICD-10-CM | POA: Diagnosis not present

## 2020-12-04 DIAGNOSIS — Z91419 Personal history of unspecified adult abuse: Secondary | ICD-10-CM | POA: Diagnosis not present

## 2020-12-04 DIAGNOSIS — F840 Autistic disorder: Secondary | ICD-10-CM | POA: Diagnosis not present

## 2020-12-04 DIAGNOSIS — K219 Gastro-esophageal reflux disease without esophagitis: Secondary | ICD-10-CM | POA: Diagnosis not present

## 2020-12-04 DIAGNOSIS — R45851 Suicidal ideations: Secondary | ICD-10-CM | POA: Diagnosis not present

## 2020-12-04 DIAGNOSIS — F250 Schizoaffective disorder, bipolar type: Secondary | ICD-10-CM | POA: Diagnosis not present

## 2020-12-04 DIAGNOSIS — Z62810 Personal history of physical and sexual abuse in childhood: Secondary | ICD-10-CM | POA: Diagnosis not present

## 2020-12-04 DIAGNOSIS — Z9151 Personal history of suicidal behavior: Secondary | ICD-10-CM | POA: Diagnosis not present

## 2020-12-04 DIAGNOSIS — J45909 Unspecified asthma, uncomplicated: Secondary | ICD-10-CM | POA: Diagnosis not present

## 2020-12-04 DIAGNOSIS — F603 Borderline personality disorder: Secondary | ICD-10-CM | POA: Diagnosis not present

## 2020-12-04 DIAGNOSIS — Q992 Fragile X chromosome: Secondary | ICD-10-CM | POA: Diagnosis not present

## 2020-12-04 DIAGNOSIS — G40909 Epilepsy, unspecified, not intractable, without status epilepticus: Secondary | ICD-10-CM | POA: Diagnosis not present

## 2020-12-05 DIAGNOSIS — R45851 Suicidal ideations: Secondary | ICD-10-CM | POA: Diagnosis not present

## 2020-12-05 DIAGNOSIS — F840 Autistic disorder: Secondary | ICD-10-CM | POA: Diagnosis not present

## 2020-12-05 DIAGNOSIS — F250 Schizoaffective disorder, bipolar type: Secondary | ICD-10-CM | POA: Diagnosis not present

## 2020-12-06 DIAGNOSIS — R45851 Suicidal ideations: Secondary | ICD-10-CM | POA: Diagnosis not present

## 2020-12-07 DIAGNOSIS — R45851 Suicidal ideations: Secondary | ICD-10-CM | POA: Diagnosis not present

## 2020-12-07 DIAGNOSIS — F250 Schizoaffective disorder, bipolar type: Secondary | ICD-10-CM | POA: Diagnosis not present

## 2020-12-07 DIAGNOSIS — F840 Autistic disorder: Secondary | ICD-10-CM | POA: Diagnosis not present

## 2020-12-08 DIAGNOSIS — F840 Autistic disorder: Secondary | ICD-10-CM | POA: Diagnosis not present

## 2020-12-08 DIAGNOSIS — F250 Schizoaffective disorder, bipolar type: Secondary | ICD-10-CM | POA: Diagnosis not present

## 2020-12-08 DIAGNOSIS — R45851 Suicidal ideations: Secondary | ICD-10-CM | POA: Diagnosis not present

## 2020-12-11 ENCOUNTER — Ambulatory Visit (HOSPITAL_COMMUNITY)
Admission: EM | Admit: 2020-12-11 | Discharge: 2020-12-11 | Disposition: A | Discharge status: Home / Self Care | Payer: PPO

## 2020-12-11 NOTE — ED Notes (Signed)
[employer] transport to return home requested. 

## 2020-12-11 NOTE — ED Provider Notes (Signed)
Patient is alert and oriented X4, calm and pleasant. Patient decline medical screening exam. Patient stated she came to [facility] to request assistance with contacting her ACT Team. She reports that she is compliant with medications. She denies SI/HI/paranoia/hallucination. This Probation officer informed patient can request to have medical screening at anytime during visit if she changes her mind and that she may return to [facility] or nearest ED if her condition changes. Patient verbalized understanding. She didn't appear to be in any acute distress; she is not complaining of any chest pain or SOB. Patient waiver of signed medical screening Exam form and continued to refusing MSE.
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
## 2020-12-11 NOTE — ED Notes (Signed)
Pt signed AMA form, [employer] to take pt home.

## 2020-12-11 NOTE — Discharge Instructions (Signed)
Patient decline MSE

## 2020-12-12 ENCOUNTER — Emergency Department (HOSPITAL_COMMUNITY): Admission: EM | Admit: 2020-12-12 | Discharge: 2020-12-12 | Discharge status: Against Medical Advice | Payer: PPO

## 2020-12-12 DIAGNOSIS — M79603 Pain in arm, unspecified: Secondary | ICD-10-CM | POA: Diagnosis not present

## 2020-12-12 NOTE — ED Triage Notes (Signed)
EMS report- Left arm pain with pins/needles x 1-2 days.  Also seen at [facility] last week and hit her head on wall and heavy ETOH over the weekend.  Denies SI thoughts

## 2020-12-12 NOTE — ED Notes (Signed)
No response x1  

## 2020-12-15 ENCOUNTER — Telehealth (HOSPITAL_COMMUNITY): Payer: Self-pay | Admitting: Family Medicine

## 2020-12-15 NOTE — BH Assessment (Signed)
Care Management - Follow Up [facility] Discharges   Writer made contact with the patient.  Patient reports that she has followed up with her established [employer]

## 2021-01-19 DIAGNOSIS — F250 Schizoaffective disorder, bipolar type: Secondary | ICD-10-CM | POA: Diagnosis not present

## 2021-02-08 ENCOUNTER — Ambulatory Visit: Payer: PPO | Admitting: Obstetrics and Gynecology

## 2021-02-09 DIAGNOSIS — F4310 Post-traumatic stress disorder, unspecified: Secondary | ICD-10-CM | POA: Diagnosis not present

## 2021-02-09 DIAGNOSIS — F250 Schizoaffective disorder, bipolar type: Secondary | ICD-10-CM | POA: Diagnosis not present

## 2021-02-09 DIAGNOSIS — F603 Borderline personality disorder: Secondary | ICD-10-CM | POA: Diagnosis not present

## 2021-02-15 ENCOUNTER — Ambulatory Visit: Payer: PPO | Admitting: Obstetrics and Gynecology

## 2021-02-24 DIAGNOSIS — R1084 Generalized abdominal pain: Secondary | ICD-10-CM | POA: Diagnosis not present

## 2021-02-24 DIAGNOSIS — I959 Hypotension, unspecified: Secondary | ICD-10-CM | POA: Diagnosis not present

## 2021-02-24 DIAGNOSIS — R112 Nausea with vomiting, unspecified: Secondary | ICD-10-CM | POA: Diagnosis not present

## 2021-02-25 ENCOUNTER — Other Ambulatory Visit: Payer: Self-pay

## 2021-02-25 ENCOUNTER — Encounter (HOSPITAL_COMMUNITY): Payer: Self-pay

## 2021-02-25 ENCOUNTER — Emergency Department (HOSPITAL_COMMUNITY)
Admission: EM | Admit: 2021-02-25 | Discharge: 2021-02-25 | Disposition: A | Discharge status: Home / Self Care | Payer: PPO | Attending: Emergency Medicine | Admitting: Emergency Medicine

## 2021-02-25 ENCOUNTER — Emergency Department (HOSPITAL_COMMUNITY): Payer: PPO

## 2021-02-25 DIAGNOSIS — R1013 Epigastric pain: Secondary | ICD-10-CM | POA: Diagnosis not present

## 2021-02-25 DIAGNOSIS — F840 Autistic disorder: Secondary | ICD-10-CM | POA: Insufficient documentation

## 2021-02-25 DIAGNOSIS — R109 Unspecified abdominal pain: Secondary | ICD-10-CM | POA: Diagnosis not present

## 2021-02-25 DIAGNOSIS — R1110 Vomiting, unspecified: Secondary | ICD-10-CM | POA: Diagnosis not present

## 2021-02-25 LAB — CBC WITH DIFFERENTIAL/PLATELET
Abs Immature Granulocytes: 0.03 10*3/uL (ref 0.00–0.07)
Basophils Absolute: 0 10*3/uL (ref 0.0–0.1)
Basophils Relative: 0 %
Eosinophils Absolute: 0.1 10*3/uL (ref 0.0–0.5)
Eosinophils Relative: 1 %
HCT: 39.1 % (ref 36.0–46.0)
Hemoglobin: 12.4 g/dL (ref 12.0–15.0)
Immature Granulocytes: 0 %
Lymphocytes Relative: 29 %
Lymphs Abs: 2.7 10*3/uL (ref 0.7–4.0)
MCH: 26 pg (ref 26.0–34.0)
MCHC: 31.7 g/dL (ref 30.0–36.0)
MCV: 82 fL (ref 80.0–100.0)
Monocytes Absolute: 0.6 10*3/uL (ref 0.1–1.0)
Monocytes Relative: 7 %
Neutro Abs: 5.8 10*3/uL (ref 1.7–7.7)
Neutrophils Relative %: 63 %
Platelets: 253 10*3/uL (ref 150–400)
RBC: 4.77 MIL/uL (ref 3.87–5.11)
RDW: 13.7 % (ref 11.5–15.5)
WBC: 9.2 10*3/uL (ref 4.0–10.5)
nRBC: 0 % (ref 0.0–0.2)

## 2021-02-25 LAB — COMPREHENSIVE METABOLIC PANEL
ALT: 17 U/L (ref 0–44)
AST: 19 U/L (ref 15–41)
Albumin: 4.2 g/dL (ref 3.5–5.0)
Alkaline Phosphatase: 68 U/L (ref 38–126)
Anion gap: 7 (ref 5–15)
BUN: 8 mg/dL (ref 6–20)
CO2: 22 mmol/L (ref 22–32)
Calcium: 9 mg/dL (ref 8.9–10.3)
Chloride: 108 mmol/L (ref 98–111)
Creatinine, Ser: 0.63 mg/dL (ref 0.44–1.00)
GFR, Estimated: >60 mL/min (ref >60)
Glucose, Bld: 114 mg/dL — ABNORMAL HIGH (ref 70–99)
Potassium: 3.9 mmol/L (ref 3.5–5.1)
Sodium: 137 mmol/L (ref 135–145)
Total Bilirubin: 0.4 mg/dL (ref 0.3–1.2)
Total Protein: 7.5 g/dL (ref 6.5–8.1)

## 2021-02-25 LAB — PREGNANCY, URINE: Preg Test, Ur: NEGATIVE

## 2021-02-25 LAB — LIPASE, BLOOD: Lipase: 35 U/L (ref 11–51)

## 2021-02-25 MED ORDER — IOHEXOL 300 MG/ML  SOLN
100.0000 mL | Freq: Once | INTRAMUSCULAR | Status: AC | PRN
  Administered 2021-02-25: 100 mL via INTRAVENOUS

## 2021-02-25 MED ORDER — SODIUM CHLORIDE 0.9 % IV BOLUS
1000.0000 mL | Freq: Once | INTRAVENOUS | Status: AC
  Administered 2021-02-25: 1000 mL via INTRAVENOUS

## 2021-02-25 MED ORDER — ALUM & MAG HYDROXIDE-SIMETH 200-200-20 MG/5ML PO SUSP
30.0000 mL | Freq: Once | ORAL | Status: AC
  Administered 2021-02-25: 30 mL via ORAL
  Filled 2021-02-25: qty 30

## 2021-02-25 MED ORDER — DICYCLOMINE HCL 20 MG PO TABS: 20.0000 mg | ORAL_TABLET | Freq: Two times a day (BID) | ORAL | 0 refills | Status: DC | PRN

## 2021-02-25 MED ORDER — LIDOCAINE VISCOUS HCL 2 % MT SOLN
15.0000 mL | Freq: Once | OROMUCOSAL | Status: AC
  Administered 2021-02-25: 15 mL via ORAL
  Filled 2021-02-25: qty 15

## 2021-02-25 MED ORDER — HYDROMORPHONE HCL 1 MG/ML IJ SOLN
1.0000 mg | Freq: Once | INTRAMUSCULAR | Status: AC
  Administered 2021-02-25: 1 mg via INTRAVENOUS
  Filled 2021-02-25: qty 1

## 2021-02-25 MED ORDER — ONDANSETRON HCL 4 MG/2ML IJ SOLN
4.0000 mg | Freq: Once | INTRAMUSCULAR | Status: AC
  Administered 2021-02-25: 4 mg via INTRAVENOUS
  Filled 2021-02-25: qty 2

## 2021-02-25 MED ORDER — ONDANSETRON HCL 4 MG PO TABS: 4.0000 mg | ORAL_TABLET | Freq: Four times a day (QID) | ORAL | 0 refills | Status: DC

## 2021-02-25 MED ORDER — DICYCLOMINE HCL 10 MG PO CAPS
20.0000 mg | ORAL_CAPSULE | Freq: Once | ORAL | Status: AC
  Administered 2021-02-25: 20 mg via ORAL
  Filled 2021-02-25: qty 2

## 2021-02-25 NOTE — ED Provider Notes (Signed)
[facility] DEPT Provider Note   CSN: [id number] Arrival date & time: 02/25/21  0009     History  Chief Complaint  Patient presents with   Abdominal Pain    [name] is a 31 y.o. female.  HPI  Patient with medical history including autism, schizoaffective disorder GERD presents with chief complaint of epigastric pain.  Patient states that about 4 days ago, she remains in her upper abdomen, does not radiate, has associated nausea vomiting, denies hematemesis or coffee-ground emesis denies any constipation or diarrhea denies any urinary symptoms, she states that she has history of pancreatitis and this feels similar to this.  She denies any alcohol use or NSAID use no history of stomach ulcers or GI bleeds she has had no abdominal surgeries She states position and/or oral intake does not improve or worsen the pain, she has no associated fevers, chills, chest pain, short of breath, general body aches.    Home Medications Prior to Admission medications   Medication Sig Start Date End Date Taking? Authorizing Provider  dicyclomine (BENTYL) 20 MG tablet Take 1 tablet (20 mg total) by mouth 2 (two) times daily as needed for spasms. 02/25/21  Yes Marcello Fennel, PA-C  ondansetron (ZOFRAN) 4 MG tablet Take 1 tablet (4 mg total) by mouth every 6 (six) hours. 02/25/21  Yes Marcello Fennel, PA-C  albuterol (PROVENTIL HFA;VENTOLIN HFA) 108 (90 Base) MCG/ACT inhaler Inhale 1-2 puffs into the lungs every 6 (six) hours as needed for wheezing or shortness of breath.    [provider]  melatonin 3 MG TABS tablet Take 3 mg by mouth at bedtime.    [provider]  pantoprazole (PROTONIX) 40 MG tablet Take 40 mg by mouth daily. 08/15/20   [provider]  risperiDONE (RISPERDAL) 3 MG tablet Take 1 tablet (3 mg total) by mouth at bedtime. 02/08/20   Freida Busman, MD  sertraline (ZOLOFT) 50 MG tablet Take 3 tablets (150 mg total)  by mouth daily. 02/09/20   Freida Busman, MD      Allergies    Bee venom, Coconut flavor, Geodon [ziprasidone hcl], Haloperidol and related, Lithium, Oxycodone, Quetiapine, Seroquel [quetiapine fumarate], Shellfish allergy, Phenergan [promethazine hcl], Prilosec [omeprazole], Sulfa antibiotics, Tegretol [carbamazepine], Prozac [fluoxetine], and Tape    Review of Systems   Review of Systems  Constitutional:  Negative for chills and fever.  Respiratory:  Negative for shortness of breath.   Cardiovascular:  Negative for chest pain.  Gastrointestinal:  Positive for abdominal pain, nausea and vomiting. Negative for blood in stool and diarrhea.  Neurological:  Negative for headaches.   Physical Exam Updated Vital Signs BP 117/74    Pulse (!) 102    Temp 98 F (36.7 C) (Oral)    Resp 18    Ht 5' 4"  (1.626 m)    Wt 97.5 kg    SpO2 98%    BMI 36.90 kg/m  Physical Exam Vitals and nursing note reviewed.  Constitutional:      General: She is not in acute distress.    Appearance: She is not ill-appearing.  HENT:     Head: Normocephalic and atraumatic.     Nose: No congestion.  Eyes:     Conjunctiva/sclera: Conjunctivae normal.  Cardiovascular:     Rate and Rhythm: Normal rate and regular rhythm.     Pulses: Normal pulses.     Heart sounds: No murmur heard.   No friction rub. No gallop.  Pulmonary:     Effort: No respiratory distress.     Breath sounds: No wheezing, rhonchi or rales.  Abdominal:     Palpations: Abdomen is soft.     Tenderness: There is abdominal tenderness. There is no right CVA tenderness or left CVA tenderness.     Comments: Abdomen nondistended, normoactive bowel sounds, dull to percussion, she had tenderness in her upper quadrants, there is no guarding, rebound times, peritoneal sign, negative Murphy sign McBurney point no CVA tenderness present.  Musculoskeletal:     Right lower leg: No edema.     Left lower leg: No edema.  Skin:    General: Skin is warm and dry.   Neurological:     Mental Status: She is alert.  Psychiatric:        Mood and Affect: Mood normal.    ED Results / Procedures / Treatments   Labs (all labs ordered are listed, but only abnormal results are displayed) Labs Reviewed  COMPREHENSIVE METABOLIC PANEL - Abnormal; Notable for the following components:      Result Value   Glucose, Bld 114 (*)    All other components within normal limits  CBC WITH DIFFERENTIAL/PLATELET  LIPASE, BLOOD  PREGNANCY, URINE    EKG None  Radiology CT ABDOMEN PELVIS W CONTRAST  Result Date: 02/25/2021 CLINICAL DATA:  Epigastric pain, vomiting. EXAM: CT ABDOMEN AND PELVIS WITH CONTRAST TECHNIQUE: Multidetector CT imaging of the abdomen and pelvis was performed using the standard protocol following bolus administration of intravenous contrast. RADIATION DOSE REDUCTION: This exam was performed according to the departmental dose-optimization program which includes automated exposure control, adjustment of the mA and/or kV according to patient size and/or use of iterative reconstruction technique. CONTRAST:  113m OMNIPAQUE IOHEXOL 300 MG/ML  SOLN COMPARISON:  11/30/2018. FINDINGS: Lower chest: Minimal atelectasis is present at the lung bases. Hepatobiliary: No focal liver abnormality is seen. No gallstones, gallbladder wall thickening, or biliary dilatation. Pancreas: Unremarkable. No pancreatic ductal dilatation or surrounding inflammatory changes. Spleen: Normal in size without focal abnormality. Adrenals/Urinary Tract: The adrenal glands are within normal limits. The kidneys enhance symmetrically. Excreted contrast is present at the renal pyramids limiting evaluation for renal calculus. No obstructive uropathy is seen bilaterally. The bladder is unremarkable. Stomach/Bowel: The stomach is within normal limits. No bowel obstruction, free air, or pneumatosis. Scattered diverticula are present along the colon without evidence of diverticulitis. A normal appendix  is present in the right lower quadrant. No focal bowel wall thickening. Vascular/Lymphatic: No significant vascular findings are present. No enlarged abdominal or pelvic lymph nodes. Reproductive: The uterus is unremarkable. There is a cystic structure with a hyperdense rim in the right adnexa measuring 1.7 cm, likely corpus luteal or hemorrhagic cyst. Other: Trace amount of free fluid in the right adnexa. A small fat containing umbilical hernia is noted. Musculoskeletal: No acute osseous abnormality. IMPRESSION: No acute intra-abdominal process. Electronically Signed   By: LBrett FairyM.D.   On: 02/25/2021 04:24    Procedures Procedures    Medications Ordered in ED Medications  sodium chloride 0.9 % bolus 1,000 mL (0 mLs Intravenous Stopped 02/25/21 0306)  ondansetron (ZOFRAN) injection 4 mg (4 mg Intravenous Given 02/25/21 0204)  HYDROmorphone (DILAUDID) injection 1 mg (1 mg Intravenous Given 02/25/21 0205)  alum & mag hydroxide-simeth (MAALOX/MYLANTA) 200-200-20 MG/5ML suspension 30 mL (30 mLs Oral Given 02/25/21 0321)    And  lidocaine (XYLOCAINE) 2 % viscous mouth solution 15 mL (15 mLs Oral Given 02/25/21 0321)  dicyclomine (  BENTYL) capsule 20 mg (20 mg Oral Given 02/25/21 0320)  iohexol (OMNIPAQUE) 300 MG/ML solution 100 mL (100 mLs Intravenous Contrast Given 02/25/21 0403)    ED Course/ Medical Decision Making/ A&P                           Medical Decision Making Amount and/or Complexity of Data Reviewed Labs: ordered. Radiology: ordered.  Risk OTC drugs. Prescription drug management.   This patient presents to the ED for concern of abdominal pain, this involves an extensive number of treatment options, and is a complaint that carries with it a high risk of complications and morbidity.  The differential diagnosis includes cholecystitis, perforated stomach ulcer, diverticulitis lower lung pneumonia    Additional history obtained:  Additional history obtained from electronic  medical record    Co morbidities that complicate the patient evaluation  N/A  Social Determinants of Health:  N/A    Lab Tests:  I Ordered, and personally interpreted labs.  The pertinent results include: CBC is unremarkable, CMP shows glucose of 114 lipase 35 urine pregnancy is negative   Imaging Studies ordered:  I ordered imaging studies including CT ab pelvis I independently visualized and interpreted imaging which showed CT ab pelvis negative for acute findings I agree with the radiologist interpretation    Medicines ordered and prescription drug management:  I ordered medication including fluids, antiemetics, Dilaudid for stomach pains, fluid resuscitation, nausea vomiting I have reviewed the patients home medicines and have made adjustments as needed    Reevaluation:  Patient is reassessed after fluids, pain medication, antiemetics, states she no longer feels nauseous but still has stomach pain, reassessed she still tender mainly in her epigastric region, will obtain CT imaging for further evaluation.  Patient found resting comfortably, updated on lab or imaging she is agreeable for discharge.   Rule out I have low suspicion for UTI, Pilo, kidney stone she denies any urinary symptoms, has no CVA tenderness present my exam.  I have low suspicion for AAA or dissection as presentation is atypical etiology patient is low risk factors.  I have low suspicion for liver or gallbladder abnormality as liver enzymes alk phos T bili all within normal limits CT imaging was negative for acute findings.  Low suspicion for pancreatitis lipase within normal limits.  Low suspicion for intra-abdominal infections, volvulus, bowel obstruction, intra-abdominal mass CT imaging is negative for these findings.    Dispostion and problem list  After consideration of the diagnostic results and the patients response to treatment, I feel that the patent would benefit from   Abdominal pain  since resolved-unclear etiology we will provide her with Bentyl, antiemetics, follow-up with GI for further evaluation if strict return precautions.            Final Clinical Impression(s) / ED Diagnoses Final diagnoses:  Epigastric pain    Rx / DC Orders ED Discharge Orders          Ordered    dicyclomine (BENTYL) 20 MG tablet  2 times daily PRN        02/25/21 0503    ondansetron (ZOFRAN) 4 MG tablet  Every 6 hours        02/25/21 0503              Marcello Fennel, PA-C 02/25/21 1324    Varney Biles, MD 02/26/21 [phone]

## 2021-02-25 NOTE — Discharge Instructions (Signed)
Lab work and imaging are reassuring, provide you with Bentyl please use for stomach spasms, Zofran for nausea  Please follow-up with GI doctor for further evaluation  Come back to the emergency department if you develop chest pain, shortness of breath, severe abdominal pain, uncontrolled nausea, vomiting, diarrhea.

## 2021-02-25 NOTE — ED Triage Notes (Signed)
Pt presents from home with pain across the rt side of her upper abdomen. Started 4 days ago and now she is vomiting yellow fluid

## 2021-02-28 ENCOUNTER — Telehealth: Payer: Self-pay | Admitting: Family Medicine

## 2021-02-28 NOTE — Telephone Encounter (Signed)
Patient said she went to the ED and they told her she had a cyst patient want to know what she need to do.

## 2021-02-28 NOTE — Telephone Encounter (Signed)
Called pt. Pt reports concerns about CT results. CT completed in ED while being seen for GI symptoms. Reproductive section shows possible hemorrhagic cyst vs corpus luteum. Explained I will ask Rip Harbour to review and I will notify patient of any follow up needed. Pt to keep annual visit in March.

## 2021-03-01 ENCOUNTER — Telehealth: Payer: Self-pay

## 2021-03-01 DIAGNOSIS — J45909 Unspecified asthma, uncomplicated: Secondary | ICD-10-CM | POA: Diagnosis not present

## 2021-03-01 DIAGNOSIS — N83209 Unspecified ovarian cyst, unspecified side: Secondary | ICD-10-CM

## 2021-03-01 DIAGNOSIS — R0602 Shortness of breath: Secondary | ICD-10-CM | POA: Diagnosis not present

## 2021-03-01 DIAGNOSIS — R059 Cough, unspecified: Secondary | ICD-10-CM | POA: Diagnosis not present

## 2021-03-01 DIAGNOSIS — G4733 Obstructive sleep apnea (adult) (pediatric): Secondary | ICD-10-CM | POA: Diagnosis not present

## 2021-03-01 DIAGNOSIS — R45851 Suicidal ideations: Secondary | ICD-10-CM | POA: Diagnosis not present

## 2021-03-01 NOTE — Telephone Encounter (Signed)
Chancy Milroy, MD  Annabell Howells, RN If we can get the U/S 3-4 days prior to appt with me that would be great.  Thanks  Legrand Como        Previous Messages   ----- Message -----  From: Annabell Howells, RN  Sent: 03/01/2021  11:07 AM EST  To: Chancy Milroy, MD  Subject: RE: CT scan                                     Perfect! Can we schedule the ultrasound when she is here for her visit or will she need this before?  ----- Message -----  From: Chancy Milroy, MD  Sent: 03/01/2021  10:17 AM EST  To: Annabell Howells, RN  Subject: RE: CT scan                                     Please let pt know that the cyst is a common finding, small < 2 cm, usually resolved without problems.  F/U US to confim resolution.   ----------------------------------------------------------------  Called pt to review provider recommendation. [name] scheduled for 04/06/21 @ 1330. Pt notified to arrive 30 minutes early with a full bladder.

## 2021-03-02 ENCOUNTER — Other Ambulatory Visit: Payer: Self-pay

## 2021-03-02 ENCOUNTER — Ambulatory Visit (HOSPITAL_COMMUNITY)
Admission: EM | Admit: 2021-03-02 | Discharge: 2021-03-04 | Disposition: A | Discharge status: Home / Self Care | Payer: PPO | Attending: Family | Admitting: Family

## 2021-03-02 DIAGNOSIS — Z20822 Contact with and (suspected) exposure to covid-19: Secondary | ICD-10-CM | POA: Insufficient documentation

## 2021-03-02 DIAGNOSIS — R45851 Suicidal ideations: Secondary | ICD-10-CM | POA: Insufficient documentation

## 2021-03-02 DIAGNOSIS — F250 Schizoaffective disorder, bipolar type: Secondary | ICD-10-CM

## 2021-03-02 DIAGNOSIS — F259 Schizoaffective disorder, unspecified: Secondary | ICD-10-CM | POA: Diagnosis present

## 2021-03-02 LAB — RESP PANEL BY RT-PCR (FLU A&B, COVID) ARPGX2
Influenza A by PCR: NEGATIVE
Influenza B by PCR: NEGATIVE
SARS Coronavirus 2 by RT PCR: NEGATIVE

## 2021-03-02 LAB — LIPID PANEL
Cholesterol: 227 mg/dL — ABNORMAL HIGH (ref 0–200)
HDL: 45 mg/dL (ref >40)
LDL Cholesterol: 153 mg/dL — ABNORMAL HIGH (ref 0–99)
Total CHOL/HDL Ratio: 5 RATIO
Triglycerides: 147 mg/dL (ref <150)
VLDL: 29 mg/dL (ref 0–40)

## 2021-03-02 LAB — CBC WITH DIFFERENTIAL/PLATELET
Abs Immature Granulocytes: 0.03 10*3/uL (ref 0.00–0.07)
Basophils Absolute: 0 10*3/uL (ref 0.0–0.1)
Basophils Relative: 0 %
Eosinophils Absolute: 0 10*3/uL (ref 0.0–0.5)
Eosinophils Relative: 0 %
HCT: 40.3 % (ref 36.0–46.0)
Hemoglobin: 13 g/dL (ref 12.0–15.0)
Immature Granulocytes: 0 %
Lymphocytes Relative: 30 %
Lymphs Abs: 2.2 10*3/uL (ref 0.7–4.0)
MCH: 26.1 pg (ref 26.0–34.0)
MCHC: 32.3 g/dL (ref 30.0–36.0)
MCV: 80.8 fL (ref 80.0–100.0)
Monocytes Absolute: 0.4 10*3/uL (ref 0.1–1.0)
Monocytes Relative: 5 %
Neutro Abs: 4.9 10*3/uL (ref 1.7–7.7)
Neutrophils Relative %: 65 %
Platelets: 272 10*3/uL (ref 150–400)
RBC: 4.99 MIL/uL (ref 3.87–5.11)
RDW: 13.4 % (ref 11.5–15.5)
WBC: 7.6 10*3/uL (ref 4.0–10.5)
nRBC: 0 % (ref 0.0–0.2)

## 2021-03-02 LAB — COMPREHENSIVE METABOLIC PANEL
ALT: 16 U/L (ref 0–44)
AST: 16 U/L (ref 15–41)
Albumin: 4.3 g/dL (ref 3.5–5.0)
Alkaline Phosphatase: 76 U/L (ref 38–126)
Anion gap: 11 (ref 5–15)
BUN: 5 mg/dL — ABNORMAL LOW (ref 6–20)
CO2: 20 mmol/L — ABNORMAL LOW (ref 22–32)
Calcium: 9.5 mg/dL (ref 8.9–10.3)
Chloride: 106 mmol/L (ref 98–111)
Creatinine, Ser: 0.69 mg/dL (ref 0.44–1.00)
GFR, Estimated: >60 mL/min (ref >60)
Glucose, Bld: 89 mg/dL (ref 70–99)
Potassium: 3.9 mmol/L (ref 3.5–5.1)
Sodium: 137 mmol/L (ref 135–145)
Total Bilirubin: 0.5 mg/dL (ref 0.3–1.2)
Total Protein: 7.3 g/dL (ref 6.5–8.1)

## 2021-03-02 LAB — POCT URINE DRUG SCREEN - MANUAL ENTRY (I-SCREEN)
POC Amphetamine UR: NOT DETECTED
POC Buprenorphine (BUP): NOT DETECTED
POC Cocaine UR: NOT DETECTED
POC Marijuana UR: NOT DETECTED
POC Methadone UR: NOT DETECTED
POC Methamphetamine UR: NOT DETECTED
POC Morphine: NOT DETECTED
POC Oxazepam (BZO): NOT DETECTED
POC Oxycodone UR: NOT DETECTED
POC Secobarbital (BAR): NOT DETECTED

## 2021-03-02 LAB — ETHANOL: Alcohol, Ethyl (B): <10 mg/dL (ref <10)

## 2021-03-02 LAB — TSH: TSH: 2.216 u[IU]/mL (ref 0.350–4.500)

## 2021-03-02 LAB — MAGNESIUM: Magnesium: 2.1 mg/dL (ref 1.7–2.4)

## 2021-03-02 LAB — POC SARS CORONAVIRUS 2 AG: SARSCOV2ONAVIRUS 2 AG: NEGATIVE

## 2021-03-02 LAB — HEMOGLOBIN A1C
Hgb A1c MFr Bld: 5.1 % (ref 4.8–5.6)
Mean Plasma Glucose: 99.67 mg/dL

## 2021-03-02 LAB — POC SARS CORONAVIRUS 2 AG -  ED: SARS Coronavirus 2 Ag: NEGATIVE

## 2021-03-02 LAB — POCT PREGNANCY, URINE: Preg Test, Ur: NEGATIVE

## 2021-03-02 MED ORDER — RISPERIDONE 3 MG PO TABS
3.0000 mg | ORAL_TABLET | Freq: Every day | ORAL | Status: DC
  Administered 2021-03-02 – 2021-03-03 (×2): 3 mg via ORAL
  Filled 2021-03-02 (×2): qty 1

## 2021-03-02 MED ORDER — ALUM & MAG HYDROXIDE-SIMETH 200-200-20 MG/5ML PO SUSP: 30.0000 mL | ORAL | Status: DC | PRN

## 2021-03-02 MED ORDER — ACETAMINOPHEN 325 MG PO TABS
650.0000 mg | ORAL_TABLET | Freq: Four times a day (QID) | ORAL | Status: DC | PRN
  Administered 2021-03-03: 650 mg via ORAL
  Filled 2021-03-02: qty 2

## 2021-03-02 MED ORDER — IBUPROFEN 400 MG PO TABS
400.0000 mg | ORAL_TABLET | Freq: Once | ORAL | Status: AC
  Administered 2021-03-02: 400 mg via ORAL
  Filled 2021-03-02: qty 1

## 2021-03-02 MED ORDER — SERTRALINE HCL 50 MG PO TABS
150.0000 mg | ORAL_TABLET | Freq: Every day | ORAL | Status: DC
  Administered 2021-03-02 – 2021-03-04 (×3): 150 mg via ORAL
  Filled 2021-03-02 (×3): qty 1

## 2021-03-02 MED ORDER — MAGNESIUM HYDROXIDE 400 MG/5ML PO SUSP: 30.0000 mL | Freq: Every day | ORAL | Status: DC | PRN

## 2021-03-02 MED ORDER — HYDROXYZINE HCL 25 MG PO TABS
25.0000 mg | ORAL_TABLET | Freq: Three times a day (TID) | ORAL | Status: DC | PRN
  Administered 2021-03-02 – 2021-03-04 (×4): 25 mg via ORAL
  Filled 2021-03-02 (×4): qty 1

## 2021-03-02 MED ORDER — TRAZODONE HCL 50 MG PO TABS
50.0000 mg | ORAL_TABLET | Freq: Every evening | ORAL | Status: DC | PRN
  Filled 2021-03-02: qty 1

## 2021-03-02 NOTE — ED Notes (Addendum)
Awake at this time c/o anxiety , given Hydroxizine 25 mg at 2329 .

## 2021-03-02 NOTE — ED Notes (Signed)
Appears to be resting quietly at this time , eyes closed , respirations even and unlabored , will continue to monitor for safety

## 2021-03-02 NOTE — BH Assessment (Signed)
[name] presents with BHRT  voluntary reporting SI, worsening depression. With plan to OD or cut self. Reports several stressors. Pt is emergent

## 2021-03-02 NOTE — ED Provider Notes (Signed)
Behavioral Health Admission H&P [facility])  Date: 03/02/21 Patient Name: [name] MRN: [id number] Chief Complaint:  Chief Complaint  Patient presents with   Depression   Suicidal      Diagnoses:  Final diagnoses:  Schizoaffective disorder, bipolar type [facility])  Suicidal ideation    HPI: Patient presents voluntarily to [facility] behavioral health for walk-in assessment.  Patient is transported by Event organiser, remains voluntary at this time.  [name] states "I feel like I need to be hospitalized because I am going through so much."  Recent stressors include physical health concerns.  Patient visited by home health care provider on yesterday, suggested that she should see a pulmonologist and a cardiologist after increased heart rate measured with brief exertion on stairs.  Additional stressors include financial concerns.  She reports she has credit cards that she needs to pay, she has to attend court related to credit cards.  She has trouble securing employment related to her knees that will allow her to stand for an extended period of time.  She is visiting food banks, states "that is not exactly the food you want to eat."  Also she feels hopeless related to her daughter who has been placed out of her care for many years.  She reports feeling frustrated by attorney fees when she feels that she will not be able to "get her back."  Patient is assessed face-to-face by nurse practitioner.  She is seated in assessment area, no acute distress.  She is alert and oriented, pleasant and cooperative during assessment.   Patient has been diagnosed with schizoaffective disorder, bipolar type, major depressive disorder, suicidal ideation and autism spectrum disorder.  She reports she is compliant with current medications including risperidone 3 mg nightly and Zoloft 150 mg daily along with as needed hydroxyzine.  She reports these medications are "the only meds that have ever worked  for me."  Medication management as well as counseling provided by [employer].  She has recently transition from act team to CST team.  She reports history of multiple inpatient psychiatric hospitalizations.  [name] reports depressed and hopeless mood, congruent affect.  She endorses suicidal ideation with a plan to overdose on home medications.  History of 16 previous suicide attempts, most recent attempt in December 2021.  She endorses history of nonsuicidal self-harm behavior.  Scarring noted to patient's left wrist, reports she cut her left wrist approximately 7 months ago, 7 months ago last episode of nonsuicidal self-harm behavior.  She denies homicidal ideations. She has normal speech and behavior.  She denies both auditory and visual hallucinations.  Patient is able to converse coherently with goal-directed thoughts and no distractibility or preoccupation.  He denies paranoia.  Objectively there is no evidence of psychosis/mania or delusional thinking.  [name] resides with her "ex lover" she denies access to weapons.  She receives disability benefit and is seeking employment.  She denies alcohol and substance use.  Patient endorses average sleep and appetite.  Offered support and encouragement.    PHQ 2-9:  [place] ED from 02/03/2020 in [facility] ED from 07/26/2019 in [facility] Office Visit from 08/20/2016 in [place] for [facility]  Thoughts that you would be better off dead, or of hurting yourself in some way Nearly every day  [Phreesia 02/03/2020] Nearly every day Not at all  PHQ-9 Total Score 27 15 1        Flowsheet Row ED from 02/25/2021 in  [place] DEPT ED from 12/01/2020 in [facility] ED from 11/21/2020 in [place] DEPT  C-SSRS RISK CATEGORY No Risk High Risk High Risk        Total Time spent with patient:  30 minutes  Musculoskeletal  Strength & Muscle Tone: within normal limits Gait & Station: normal Patient leans: N/A  Psychiatric Specialty Exam  Presentation General Appearance: Appropriate for Environment; Casual  Eye Contact:Fair  Speech:Clear and Coherent; Normal Rate  Speech Volume:Normal  Handedness:Right   Mood and Affect  Mood:Depressed; Hopeless  Affect:Congruent; Depressed   Thought Process  Thought Processes:Coherent; Goal Directed; Linear  Descriptions of Associations:Intact  Orientation:Full (Time, Place and Person)  Thought Content:Logical  Diagnosis of Schizophrenia or Schizoaffective disorder in past: No   Hallucinations:Hallucinations: None  Ideas of Reference:None  Suicidal Thoughts:Suicidal Thoughts: Yes, Active SI Active Intent and/or Plan: With Intent; With Plan  Homicidal Thoughts:Homicidal Thoughts: No   Sensorium  Memory:Immediate Good; Recent Fair  Judgment:Poor  Insight:Lacking   Executive Functions  Concentration:Fair  Attention Span:Good  [place]  Language:Good   Psychomotor Activity  Psychomotor Activity:Psychomotor Activity: Normal   Assets  Assets:Communication Skills; Desire for Improvement; Housing; Intimacy; Leisure Time; Physical Health; Resilience; Social Support   Sleep  Sleep:Sleep: Fair   Nutritional Assessment (For OBS and FBC admissions only) Has the patient had a weight loss or gain of 10 pounds or more in the last 3 months?: No Has the patient had a decrease in food intake/or appetite?: No Does the patient have dental problems?: No Does the patient have eating habits or behaviors that may be indicators of an eating disorder including binging or inducing vomiting?: No Has the patient recently lost weight without trying?: 0 Has the patient been eating poorly because of a decreased appetite?: 0 Malnutrition Screening Tool Score: 0    Physical Exam ROS  Blood  pressure 117/77, pulse 80, temperature 98.7 F (37.1 C), temperature source Oral, resp. rate 18, SpO2 98 %. There is no height or weight on file to calculate BMI.  Past Psychiatric History: Schizoaffective disorder, bipolar type, autism spectrum disorder, major depressive disorder, suicidal ideation, borderline personality disorder  Is the patient at risk to self? Yes  Has the patient been a risk to self in the past 6 months? Yes .    Has the patient been a risk to self within the distant past? Yes   Is the patient a risk to others? No   Has the patient been a risk to others in the past 6 months? No   Has the patient been a risk to others within the distant past? 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    Chronic constipation    Depression    Drug-seeking behavior    Essential tremor    Headache    Overdose of acetaminophen 07/2017   and other meds   Personality disorder ([place])    Schizo-affective psychosis ([place])    Schizoaffective disorder, bipolar type ([place])    Seizures ([place])    Last seizure December 2017   Sleep apnea     Past Surgical History:  Procedure Laterality Date   MOUTH SURGERY  2009 or 2010    Family History:  Family History  Problem Relation Age of Onset   Mental illness Father    Asthma Father    PDD Brother    Seizures Brother     Social History:  Social History   Socioeconomic History   Marital status: Widowed    Spouse name: Not on file   Number of children: 0   Years of education: Not on file   Highest education level: Not on file  Occupational History   Occupation: disability  Tobacco Use   Smoking status: Former    Packs/day: 0.00    Types: Cigarettes   Smokeless tobacco: Never   Tobacco comments:    Smoked for 2  years age [age]  Vaping Use   Vaping Use: Never used  Substance and Sexual Activity    Alcohol use: No    Alcohol/week: 1.0 standard drink    Types: 1 Standard drinks or equivalent per week    Comment: denies at this time   Drug use: No    Comment: History of cocaine use at age 73 for 4 months   Sexual activity: Yes    Birth control/protection: None  Other Topics Concern   Not on file  Social History Narrative   Marital status: Widowed      Children: daughter      Lives: with boyfriend, in two story home      Employment:  Disability      Tobacco: quit smoking; smoked for two years.      Alcohol ;none      Drugs: none   Has not traveled outside of the country.   Right handed         Social Determinants of Health   Financial Resource Strain: Not on file  Food Insecurity: Not on file  Transportation Needs: Not on file  Physical Activity: Not on file  Stress: Not on file  Social Connections: Not on file  Intimate Partner Violence: Not on file    SDOH:  SDOH Screenings   Alcohol Screen: Not on file  Depression (PHQ2-9): Not on file  Financial Resource Strain: Not on file  Food Insecurity: Not on file  Housing: Not on file  Physical Activity: Not on file  Social Connections: Not on file  Stress: Not on file  Tobacco Use: Medium Risk   Smoking Tobacco Use: Former   Smokeless Tobacco Use: Never   Passive Exposure: Not on file  Transportation Needs: Not on file    Last Labs:  Admission on 02/25/2021, Discharged on 02/25/2021  Component Date Value Ref Range Status   Sodium 02/25/2021 137  135 - 145 mmol/L Final   Potassium 02/25/2021 3.9  3.5 - 5.1 mmol/L Final   Chloride 02/25/2021 108  98 - 111 mmol/L Final   CO2 02/25/2021 22  22 - 32 mmol/L Final   Glucose, Bld 02/25/2021 114 (H)  70 - 99 mg/dL Final   Glucose reference range applies only to samples taken after fasting for at least 8 hours.   BUN 02/25/2021 8  6 - 20 mg/dL Final   Creatinine, Ser 02/25/2021 0.63  0.44 - 1.00 mg/dL Final   Calcium 02/25/2021 9.0  8.9 - 10.3 mg/dL Final   Total  Protein 02/25/2021 7.5  6.5 - 8.1 g/dL Final   Albumin 02/25/2021 4.2  3.5 - 5.0 g/dL Final   AST 02/25/2021 19  15 - 41 U/L Final   ALT 02/25/2021 17  0 - 44 U/L Final   Alkaline Phosphatase 02/25/2021 68  38 -  126 U/L Final   Total Bilirubin 02/25/2021 0.4  0.3 - 1.2 mg/dL Final   GFR, Estimated 02/25/2021 >60  >60 mL/min Final   Comment: (NOTE) Calculated using the CKD-EPI Creatinine Equation (2021)    Anion gap 02/25/2021 7  5 - 15 Final   Performed at [facility], [name] [address] [place], [place] [address]   WBC 02/25/2021 9.2  4.0 - 10.5 K/uL Final   RBC 02/25/2021 4.77  3.87 - 5.11 MIL/uL Final   Hemoglobin 02/25/2021 12.4  12.0 - 15.0 g/dL Final   HCT 02/25/2021 39.1  36.0 - 46.0 % Final   MCV 02/25/2021 82.0  80.0 - 100.0 fL Final   MCH 02/25/2021 26.0  26.0 - 34.0 pg Final   MCHC 02/25/2021 31.7  30.0 - 36.0 g/dL Final   RDW 02/25/2021 13.7  11.5 - 15.5 % Final   Platelets 02/25/2021 253  150 - 400 K/uL Final   nRBC 02/25/2021 0.0  0.0 - 0.2 % Final   Neutrophils Relative % 02/25/2021 63  % Final   Neutro Abs 02/25/2021 5.8  1.7 - 7.7 K/uL Final   Lymphocytes Relative 02/25/2021 29  % Final   Lymphs Abs 02/25/2021 2.7  0.7 - 4.0 K/uL Final   Monocytes Relative 02/25/2021 7  % Final   Monocytes Absolute 02/25/2021 0.6  0.1 - 1.0 K/uL Final   Eosinophils Relative 02/25/2021 1  % Final   Eosinophils Absolute 02/25/2021 0.1  0.0 - 0.5 K/uL Final   Basophils Relative 02/25/2021 0  % Final   Basophils Absolute 02/25/2021 0.0  0.0 - 0.1 K/uL Final   Immature Granulocytes 02/25/2021 0  % Final   Abs Immature Granulocytes 02/25/2021 0.03  0.00 - 0.07 K/uL Final   Performed at [facility], [place] [address] [place], [place] [address]   Lipase 02/25/2021 35  11 - 51 U/L Final   Performed at [facility], [place] [address], [place], [place] [address]   Preg Test, Ur 02/25/2021 NEGATIVE  NEGATIVE Final   Comment:        THE  SENSITIVITY OF THIS METHODOLOGY IS >20 mIU/mL. Performed at [facility], [place] [address], [place], [place] [address]   Admission on 12/01/2020, Discharged on 12/03/2020  Component Date Value Ref Range Status   SARS Coronavirus 2 by RT PCR 12/01/2020 NEGATIVE  NEGATIVE Final   Comment: (NOTE) SARS-CoV-2 target nucleic acids are NOT DETECTED.  The SARS-CoV-2 RNA is generally detectable in upper respiratory specimens during the acute phase of infection. The lowest concentration of SARS-CoV-2 viral copies this assay can detect is 138 copies/mL. A negative result does not preclude SARS-Cov-2 infection and should not be used as the sole basis for treatment or other patient management decisions. A negative result may occur with  improper specimen collection/handling, submission of specimen other than nasopharyngeal swab, presence of viral mutation(s) within the areas targeted by this assay, and inadequate number of viral copies(<138 copies/mL). A negative result must be combined with clinical observations, patient history, and epidemiological information. The expected result is Negative.  Fact Sheet for Patients:  EntrepreneurPulse.com.au  Fact Sheet for Healthcare Providers:  IncredibleEmployment.be  This test is no                          t yet approved or cleared by the [place] FDA and  has been authorized for detection and/or diagnosis of SARS-CoV-2 by FDA  under an Emergency Use Authorization (EUA). This EUA will remain  in effect (meaning this test can be used) for the duration of the COVID-19 declaration under Section 564(b)(1) of the Act, 21 U.S.C.section 360bbb-3(b)(1), unless the authorization is terminated  or revoked sooner.       Influenza A by PCR 12/01/2020 NEGATIVE  NEGATIVE Final   Influenza B by PCR 12/01/2020 NEGATIVE  NEGATIVE Final   Comment: (NOTE) The Xpert Xpress SARS-CoV-2/FLU/RSV plus assay is  intended as an aid in the diagnosis of influenza from Nasopharyngeal swab specimens and should not be used as a sole basis for treatment. Nasal washings and aspirates are unacceptable for Xpert Xpress SARS-CoV-2/FLU/RSV testing.  Fact Sheet for Patients: EntrepreneurPulse.com.au  Fact Sheet for Healthcare Providers: IncredibleEmployment.be  This test is not yet approved or cleared by the [place] FDA and has been authorized for detection and/or diagnosis of SARS-CoV-2 by FDA under an Emergency Use Authorization (EUA). This EUA will remain in effect (meaning this test can be used) for the duration of the COVID-19 declaration under Section 564(b)(1) of the Act, 21 U.S.C. section 360bbb-3(b)(1), unless the authorization is terminated or revoked.  Performed at [facility], [place] [address], [place], [place] [address]    Preg Test, Ur 12/01/2020 NEGATIVE  NEGATIVE Final   Comment:        THE SENSITIVITY OF THIS METHODOLOGY IS >20 mIU/mL. Performed at [facility], [place] [address] [place], [place] [address]    POC Amphetamine UR 12/01/2020 None Detected  NONE DETECTED (Cut Off Level 1000 ng/mL) Final   POC Secobarbital (BAR) 12/01/2020 None Detected  NONE DETECTED (Cut Off Level 300 ng/mL) Final   POC Buprenorphine (BUP) 12/01/2020 None Detected  NONE DETECTED (Cut Off Level 10 ng/mL) Final   POC Oxazepam (BZO) 12/01/2020 None Detected  NONE DETECTED (Cut Off Level 300 ng/mL) Final   POC Cocaine UR 12/01/2020 None Detected  NONE DETECTED (Cut Off Level 300 ng/mL) Final   POC Methamphetamine UR 12/01/2020 None Detected  NONE DETECTED (Cut Off Level 1000 ng/mL) Final   POC Morphine 12/01/2020 None Detected  NONE DETECTED (Cut Off Level 300 ng/mL) Final   POC Oxycodone UR 12/01/2020 None Detected  NONE DETECTED (Cut Off Level 100 ng/mL) Final   POC Methadone UR 12/01/2020 None Detected  NONE DETECTED (Cut Off Level 300 ng/mL) Final   POC  Marijuana UR 12/01/2020 None Detected  NONE DETECTED (Cut Off Level 50 ng/mL) Final   Color, Urine 12/01/2020 YELLOW  YELLOW Final   APPearance 12/01/2020 HAZY (A)  CLEAR Final   Specific Gravity, Urine 12/01/2020 1.015  1.005 - 1.030 Final   pH 12/01/2020 5.0  5.0 - 8.0 Final   Glucose, UA 12/01/2020 NEGATIVE  NEGATIVE mg/dL Final   Hgb urine dipstick 12/01/2020 NEGATIVE  NEGATIVE Final   Bilirubin Urine 12/01/2020 NEGATIVE  NEGATIVE Final   Ketones, ur 12/01/2020 NEGATIVE  NEGATIVE mg/dL Final   Protein, ur 12/01/2020 NEGATIVE  NEGATIVE mg/dL Final   Nitrite 12/01/2020 NEGATIVE  NEGATIVE Final   Leukocytes,Ua 12/01/2020 TRACE (A)  NEGATIVE Final   RBC / HPF 12/01/2020 0-5  0 - 5 RBC/hpf Final   WBC, UA 12/01/2020 0-5  0 - 5 WBC/hpf Final   Bacteria, UA 12/01/2020 RARE (A)  NONE SEEN Final   Squamous Epithelial / LPF 12/01/2020 0-5  0 - 5 Final   Mucus 12/01/2020 PRESENT   Final   Performed at [facility], [place] [address],  [place], [place] [address]   SARSCOV2ONAVIRUS 2 AG 12/01/2020 NEGATIVE  NEGATIVE Final   Comment: (NOTE) SARS-CoV-2 antigen NOT DETECTED.   Negative results are presumptive.  Negative results do not preclude SARS-CoV-2 infection and should not be used as the sole basis for treatment or other patient management decisions, including infection  control decisions, particularly in the presence of clinical signs and  symptoms consistent with COVID-19, or in those who have been in contact with the virus.  Negative results must be combined with clinical observations, patient history, and epidemiological information. The expected result is Negative.  Fact Sheet for Patients: HandmadeRecipes.com.cy  Fact Sheet for Healthcare Providers: FuneralLife.at  This test is not yet approved or cleared by the [place] FDA and  has been authorized for detection and/or diagnosis of SARS-CoV-2 by FDA under an Emergency Use  Authorization (EUA).  This EUA will remain in effect (meaning this test can be used) for the duration of  the COV                          ID-19 declaration under Section 564(b)(1) of the Act, 21 U.S.C. section 360bbb-3(b)(1), unless the authorization is terminated or revoked sooner.     Preg Test, Ur 12/01/2020 NEGATIVE  NEGATIVE Final   Comment:        THE SENSITIVITY OF THIS METHODOLOGY IS >24 mIU/mL   Admission on 11/21/2020, Discharged on 11/22/2020  Component Date Value Ref Range Status   SARS Coronavirus 2 by RT PCR 11/21/2020 NEGATIVE  NEGATIVE Final   Comment: (NOTE) SARS-CoV-2 target nucleic acids are NOT DETECTED.  The SARS-CoV-2 RNA is generally detectable in upper respiratory specimens during the acute phase of infection. The lowest concentration of SARS-CoV-2 viral copies this assay can detect is 138 copies/mL. A negative result does not preclude SARS-Cov-2 infection and should not be used as the sole basis for treatment or other patient management decisions. A negative result may occur with  improper specimen collection/handling, submission of specimen other than nasopharyngeal swab, presence of viral mutation(s) within the areas targeted by this assay, and inadequate number of viral copies(<138 copies/mL). A negative result must be combined with clinical observations, patient history, and epidemiological information. The expected result is Negative.  Fact Sheet for Patients:  EntrepreneurPulse.com.au  Fact Sheet for Healthcare Providers:  IncredibleEmployment.be  This test is no                          t yet approved or cleared by the [place] FDA and  has been authorized for detection and/or diagnosis of SARS-CoV-2 by FDA under an Emergency Use Authorization (EUA). This EUA will remain  in effect (meaning this test can be used) for the duration of the COVID-19 declaration under Section 564(b)(1) of the Act,  21 U.S.C.section 360bbb-3(b)(1), unless the authorization is terminated  or revoked sooner.       Influenza A by PCR 11/21/2020 NEGATIVE  NEGATIVE Final   Influenza B by PCR 11/21/2020 NEGATIVE  NEGATIVE Final   Comment: (NOTE) The Xpert Xpress SARS-CoV-2/FLU/RSV plus assay is intended as an aid in the diagnosis of influenza from Nasopharyngeal swab specimens and should not be used as a sole basis for treatment. Nasal washings and aspirates are unacceptable for Xpert Xpress SARS-CoV-2/FLU/RSV testing.  Fact Sheet for Patients: EntrepreneurPulse.com.au  Fact Sheet for Healthcare Providers: IncredibleEmployment.be  This test is not yet approved or cleared by the [place]  FDA and has been authorized for detection and/or diagnosis of SARS-CoV-2 by FDA under an Emergency Use Authorization (EUA). This EUA will remain in effect (meaning this test can be used) for the duration of the COVID-19 declaration under Section 564(b)(1) of the Act, 21 U.S.C. section 360bbb-3(b)(1), unless the authorization is terminated or revoked.  Performed at [facility], [place] [address] [place], [place] [address]    I-stat hCG, quantitative 11/21/2020 <5.0  <5 mIU/mL Final   Comment 3 11/21/2020          Final   Comment:   GEST. AGE      CONC.  (mIU/mL)   <=1 WEEK        5 - 50     2 WEEKS       50 - 500     3 WEEKS       100 - 10,000     4 WEEKS     1,000 - 30,000        FEMALE AND NON-PREGNANT FEMALE:     LESS THAN 5 mIU/mL    Glucose-Capillary 11/21/2020 90  70 - 99 mg/dL Final   Glucose reference range applies only to samples taken after fasting for at least 8 hours.   Sodium 11/21/2020 136  135 - 145 mmol/L Final   Potassium 11/21/2020 4.2  3.5 - 5.1 mmol/L Final   Chloride 11/21/2020 106  98 - 111 mmol/L Final   CO2 11/21/2020 20 (L)  22 - 32 mmol/L Final   Glucose, Bld 11/21/2020 95  70 - 99 mg/dL Final   Glucose reference range  applies only to samples taken after fasting for at least 8 hours.   BUN 11/21/2020 10  6 - 20 mg/dL Final   Creatinine, Ser 11/21/2020 0.68  0.44 - 1.00 mg/dL Final   Calcium 11/21/2020 9.1  8.9 - 10.3 mg/dL Final   Total Protein 11/21/2020 7.7  6.5 - 8.1 g/dL Final   Albumin 11/21/2020 4.0  3.5 - 5.0 g/dL Final   AST 11/21/2020 28  15 - 41 U/L Final   ALT 11/21/2020 20  0 - 44 U/L Final   Alkaline Phosphatase 11/21/2020 63  38 - 126 U/L Final   Total Bilirubin 11/21/2020 0.6  0.3 - 1.2 mg/dL Final   GFR, Estimated 11/21/2020 >60  >60 mL/min Final   Comment: (NOTE) Calculated using the CKD-EPI Creatinine Equation (2021)    Anion gap 11/21/2020 10  5 - 15 Final   Performed at [facility], [place] [address] [place], [place] [address]   Salicylate Lvl 56/81/2751 <7.0 (L)  7.0 - 30.0 mg/dL Final   Performed at [place] [address] [place], [place] [address]   Acetaminophen (Tylenol), Serum 11/21/2020 <10 (L)  10 - 30 ug/mL Final   Comment: (NOTE) Therapeutic concentrations vary significantly. A range of 10-30 ug/mL  may be an effective concentration for many patients. However, some  are best treated at concentrations outside of this range. Acetaminophen concentrations >150 ug/mL at 4 hours after ingestion  and >50 ug/mL at 12 hours after ingestion are often associated with  toxic reactions.  Performed at [facility], [place] [address], [place], [place] [address]    Alcohol, Ethyl (B) 11/21/2020 <10  <10 mg/dL Final   Comment: (NOTE) Lowest detectable limit for serum alcohol is 10 mg/dL.  For medical purposes only. Performed at [facility], [place] [address], [place], [place] [address]  Opiates 11/21/2020 NONE DETECTED  NONE DETECTED Final   Cocaine 11/21/2020 NONE DETECTED  NONE DETECTED Final   Benzodiazepines 11/21/2020 NONE DETECTED  NONE DETECTED Final   Amphetamines 11/21/2020 NONE DETECTED   NONE DETECTED Final   Tetrahydrocannabinol 11/21/2020 NONE DETECTED  NONE DETECTED Final   Barbiturates 11/21/2020 NONE DETECTED  NONE DETECTED Final   Comment: (NOTE) DRUG SCREEN FOR MEDICAL PURPOSES ONLY.  IF CONFIRMATION IS NEEDED FOR ANY PURPOSE, NOTIFY LAB WITHIN 5 DAYS.  LOWEST DETECTABLE LIMITS FOR URINE DRUG SCREEN Drug Class                     Cutoff (ng/mL) Amphetamine and metabolites    1000 Barbiturate and metabolites    200 Benzodiazepine                 128 Tricyclics and metabolites     300 Opiates and metabolites        300 Cocaine and metabolites        300 THC                            50 Performed at [facility], [place] [address], [place], [place] [address]    WBC 11/21/2020 9.8  4.0 - 10.5 K/uL Final   RBC 11/21/2020 4.58  3.87 - 5.11 MIL/uL Final   Hemoglobin 11/21/2020 12.0  12.0 - 15.0 g/dL Final   HCT 11/21/2020 38.0  36.0 - 46.0 % Final   MCV 11/21/2020 83.0  80.0 - 100.0 fL Final   MCH 11/21/2020 26.2  26.0 - 34.0 pg Final   MCHC 11/21/2020 31.6  30.0 - 36.0 g/dL Final   RDW 11/21/2020 14.1  11.5 - 15.5 % Final   Platelets 11/21/2020 231  150 - 400 K/uL Final   nRBC 11/21/2020 0.0  0.0 - 0.2 % Final   Neutrophils Relative % 11/21/2020 72  % Final   Neutro Abs 11/21/2020 7.0  1.7 - 7.7 K/uL Final   Lymphocytes Relative 11/21/2020 21  % Final   Lymphs Abs 11/21/2020 2.1  0.7 - 4.0 K/uL Final   Monocytes Relative 11/21/2020 6  % Final   Monocytes Absolute 11/21/2020 0.6  0.1 - 1.0 K/uL Final   Eosinophils Relative 11/21/2020 0  % Final   Eosinophils Absolute 11/21/2020 0.0  0.0 - 0.5 K/uL Final   Basophils Relative 11/21/2020 0  % Final   Basophils Absolute 11/21/2020 0.0  0.0 - 0.1 K/uL Final   Immature Granulocytes 11/21/2020 1  % Final   Abs Immature Granulocytes 11/21/2020 0.05  0.00 - 0.07 K/uL Final   Performed at [facility], [place] [address] [place], [place] [address]    Allergies: Bee venom, Coconut  flavor, Geodon [ziprasidone hcl], Haloperidol and related, Lithium, Oxycodone, Quetiapine, Seroquel [quetiapine fumarate], Shellfish allergy, Phenergan [promethazine hcl], Prilosec [omeprazole], Sulfa antibiotics, Tegretol [carbamazepine], Prozac [fluoxetine], and Tape  PTA Medications: (Not in a hospital admission)   Medical Decision Making  Patient reviewed with Dr. Serafina Mitchell.  Patient will be placed in observation area at [facility] health for treatment and stabilization.  She will be reassessed on 03/03/2021, disposition will be determined at that time.  Laboratory studies ordered including CBC, CMP, ethanol, A1c, hepatic function, lipid panel, magnesium and TSH.  Urine pregnancy and urine drug screen ordered.  EKG order initiated.  Current medications: -Acetaminophen 650 mg every 6 as needed/mild pain -Maalox 30 mL  oral every 4 as needed/digestion -Hydroxyzine 25 mg 3 times daily as needed/anxiety -Magnesium hydroxide 30 mL daily as needed/mild constipation -Trazodone 50 mg nightly as needed/sleep  Restarted home medications including: -Risperidone 3 mg nightly -Sertraline 150 mg daily     Recommendations  Based on my evaluation the patient does not appear to have an emergency medical condition.  Lucky Rathke, FNP 03/02/21  6:30 PM
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
## 2021-03-02 NOTE — Progress Notes (Signed)
Skin assessment complete and within normal limits.  Patient was oriented to the unit, provided an opportunity for questions, comments, concerns.  Pt requested ibuprofen 400 mg PO for her menstrual cramps.  NP notified and orders placed.  Pt admitted to continuing assessment unit for overnight observation related to SI and stressors created in her personal life.

## 2021-03-03 DIAGNOSIS — F250 Schizoaffective disorder, bipolar type: Secondary | ICD-10-CM | POA: Diagnosis not present

## 2021-03-03 LAB — URINALYSIS, ROUTINE W REFLEX MICROSCOPIC
Bacteria, UA: NONE SEEN
Bilirubin Urine: NEGATIVE
Glucose, UA: NEGATIVE mg/dL
Ketones, ur: NEGATIVE mg/dL
Leukocytes,Ua: NEGATIVE
Nitrite: NEGATIVE
Protein, ur: NEGATIVE mg/dL
Specific Gravity, Urine: 1.012 (ref 1.005–1.030)
pH: 6 (ref 5.0–8.0)

## 2021-03-03 MED ORDER — OLANZAPINE 10 MG IM SOLR: 10.0000 mg | Freq: Once | INTRAMUSCULAR | Status: DC | PRN

## 2021-03-03 MED ORDER — OLANZAPINE 5 MG PO TBDP
5.0000 mg | ORAL_TABLET | Freq: Once | ORAL | Status: DC
  Filled 2021-03-03: qty 1

## 2021-03-03 MED ORDER — LORAZEPAM 2 MG/ML IJ SOLN
2.0000 mg | INTRAMUSCULAR | Status: AC
  Administered 2021-03-03: 2 mg via INTRAMUSCULAR
  Filled 2021-03-03: qty 1

## 2021-03-03 MED ORDER — DIPHENHYDRAMINE HCL 50 MG/ML IJ SOLN
50.0000 mg | Freq: Once | INTRAMUSCULAR | Status: DC
  Filled 2021-03-03: qty 1

## 2021-03-03 MED ORDER — OLANZAPINE 10 MG IM SOLR: 5.0000 mg | Freq: Once | INTRAMUSCULAR | Status: DC

## 2021-03-03 MED ORDER — LORAZEPAM 1 MG PO TABS
2.0000 mg | ORAL_TABLET | ORAL | Status: AC
  Filled 2021-03-03: qty 2

## 2021-03-03 MED ORDER — OLANZAPINE 5 MG PO TBDP: 5.0000 mg | ORAL_TABLET | Freq: Once | ORAL | Status: DC

## 2021-03-03 MED ORDER — OLANZAPINE 10 MG IM SOLR
INTRAMUSCULAR | Status: AC
  Filled 2021-03-03: qty 10

## 2021-03-03 NOTE — ED Notes (Signed)
Pt has been laying in her bed without any issues or accidents at the moment. Staff and pt had a conversation about pt continuing to accept any treatment that will help pt become a better version of herself.

## 2021-03-03 NOTE — ED Notes (Signed)
Pt on one to one safety precautions due to her behavior while on the unit. RN and NP notified.

## 2021-03-03 NOTE — Progress Notes (Signed)
[name] continues to feel depressed, anxious and unsafe to go home. She was medicated for anxiety and drifted off to sleep.

## 2021-03-03 NOTE — Progress Notes (Signed)
Patient begin to hit her head against the wall again. Nurse at bedside to offering emotional support. Provider notified.

## 2021-03-03 NOTE — ED Notes (Signed)
Pt is on an one on one Pt is resting without any issues or accident at the moment. Staff will continue to observe Pt behavior and to ensure pt safety.
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
## 2021-03-03 NOTE — ED Notes (Signed)
Pt with staff under close ops.  Pt seen picking at skin, hitting head with hands, and hitting head against wall.  Staff redirects pt but pt continues with behaviors.  Only when this staff goes to notify other staff for assistance pt returns to normal seated position and then laid down.  No more self harm behaviors noted at this time.  Will continue to monitor and redirect accordingly.    When asked why she was hitting herself pt stated "I just want my head to bust open and bleed."  After making that statement pt notified nurse she was having menstrual cramps and wanted prn pain medication.  PRN medication provided.

## 2021-03-03 NOTE — ED Notes (Signed)
Pt began to bang her head on the wall staff redirected pt to stop pt stopped than layed on her bed.

## 2021-03-03 NOTE — Progress Notes (Signed)
As of 2243, [place] is still awaiting IVC paperwork prior to transporting the Pt to their facility. Weekend CSW to follow up regarding the status of the IVC paperwork tomorrow prior to transporting the Pt.    Mariea Clonts, MSW, LCSW-A  10:44 PM 03/03/2021

## 2021-03-03 NOTE — ED Provider Notes (Signed)
This Probation officer made aware by attending RN that patient is attempting to bang head on wall in an effort to "die."  Patient seated on chair, speaking with RN, upon my approach. Cooperative and not attempting to self-harm. 1:1 continuous observation order placed as patient has attempted to self harm earlier this admission by scratching arm with fingernails and reporting suicidal thoughts to strangle herself with bed linen at 1209am last night.  Olanzapine 5mg  IM or PO once added.  Patient discussed with Dr Serafina Mitchell.

## 2021-03-03 NOTE — ED Notes (Signed)
Pt was given hot dinner and juice.

## 2021-03-03 NOTE — Progress Notes (Signed)
Received [name] this AM asleep in her chair bed, she woke up, ate breakfast and was compliant with her medication. She retuned to her chair bed and drifted off to sleep, she woke up for lunch, ate and afterwards c/o feeling dizzy. She endorsed feeling depressed, anxious and suicdal.

## 2021-03-03 NOTE — ED Notes (Addendum)
Pt was picking at an old scab on her arm staff redirected pt to stop. Pt picked scab until it bled.

## 2021-03-03 NOTE — ED Notes (Addendum)
Pt observed banging her head against the wall. Pt was redirected and comforted by RN. Will continue to monitor for safety.

## 2021-03-03 NOTE — Progress Notes (Signed)
Patient given tylenol 650mg  for cramping.

## 2021-03-03 NOTE — ED Notes (Signed)
Upon checks staff noticed patient was scratching herself and immediately called the nurse.

## 2021-03-03 NOTE — ED Notes (Signed)
Pt stated that pt is not psychotic even though pt takes medication for it pt stated that she have episodes of manic, however pt stated that she has frustrated which causes her to act out.

## 2021-03-03 NOTE — ED Notes (Signed)
Pt is one on one and is safe and has not any issues or accidents at the moment.

## 2021-03-03 NOTE — Progress Notes (Signed)
[name] was banging her head on the wall,stated  she is trying to bang the devil out. Then she stated she was trying to split her head open to die. This Probation officer sat with her while she ate her snack. Tina NP arrived, talked with her and put her on one to one. She was encouraged to take the Zyprexa and refused. Paige, LPN  is sitting with her at the present time. She is resting calm;y in her bed. NP was notified related to the refusal of medication.

## 2021-03-03 NOTE — ED Notes (Signed)
Patient refused Vitals.

## 2021-03-03 NOTE — ED Notes (Signed)
Appears to be resting quietly at this time , respirations even and unlabored , no distress noted , will continue to monitor for safety .

## 2021-03-03 NOTE — ED Notes (Addendum)
Pt is on one on one at 7:54pm pt started yelling while laying in bed ( I am ready for fucking go.)Pt also said this place is stupid and that she is ready to go.

## 2021-03-03 NOTE — ED Notes (Signed)
Call from [facility] officer [name] is to be pick up around 8 AM and they will call before coming.

## 2021-03-03 NOTE — Progress Notes (Signed)
Patient hitting head against wall. Patient's RN talking with patient, offering comfort, and asking patient what does she need. Patient did not respond. Provider notified to come to the unit to speak with patient.

## 2021-03-03 NOTE — ED Notes (Addendum)
[name] is currently c/o suicidal ideations with plan to strangle her self with her bed linen . She was also observed using the nails of her right hand to scratch the skin off her left forearm leaving superficial abrasions. Provider [name] notified and order for Ativan po or IM was obtained. Staff member Niyah sitting at the bedside with pt

## 2021-03-03 NOTE — ED Notes (Signed)
Pt has been talkative to staff talking about varies topics from professional sports, to her family to her educational background. Staff listened to pt and engaged with pt on taking each day at a time. Staff encouraged pt that she can do anything she put her mind to do and goal she sets for herself.

## 2021-03-03 NOTE — Progress Notes (Signed)
CSW sent requested urinalysis to [facility]. Patient is expected to arrive to the facility tomorrow.    Mariea Clonts, MSW, LCSW-A  9:56 PM 03/03/2021

## 2021-03-03 NOTE — ED Notes (Signed)
Pt is picking at her toe nails. Staff redirected pt to stop picking at her toe nails so they will not bleed. Pt stated that she is not picking them to bleed.
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
## 2021-03-03 NOTE — ED Notes (Signed)
At 8:01pm pt started kicking the wall while laying in bed staff redirected pt and asked pt to stop and pt stopped.

## 2021-03-03 NOTE — ED Notes (Signed)
Pt received a snack a peanut and jelly sandwich.

## 2021-03-03 NOTE — Progress Notes (Addendum)
[name] was assessed banging her head on the wall, she was redirected and took her Risperdal early. She refused the trazodone stated  she only takes melatonin. She was given a urine cup per order and informed she would be leaving in the AM going to another facility.The one to one sitter remains at the bedside.

## 2021-03-03 NOTE — Progress Notes (Signed)
[facility] LCSW Progress Note   03/03/2021    5:24 PM  [name]   [id number]   Type of Contact and Topic:  Psychiatric Bed Placement   Pt accepted to [place] Unit      Patient meets inpatient criteria per Beatriz Stallion, NP  The attending provider will be Dr. Jetta Lout  Call report to [phone]   Roxy Manns, RN @ [facility] notified.     Pt scheduled  to arrive at [facility] after 0900. Please fax IVC paperwork to [phone].    Mariea Clonts, MSW, LCSW-A  5:26 PM 03/03/2021

## 2021-03-03 NOTE — ED Notes (Signed)
Pt started banging her head against the wall staff redirected her to stop pt continued to bang her head.

## 2021-03-03 NOTE — ED Provider Notes (Signed)
Behavioral Health Progress Note  Date and Time: 03/03/2021 4:21 PM Name: [name] MRN:  [id number]  Subjective: Patient states "I still feel like I should die." [name] is reassessed by nurse practitioner. She is reclined on observation unit upon my approach.  She appears asleep, easily awakened. She is alert and oriented, cooperative during reassessment. She continues to endorse passive, suicidal ideation.  He is unable to contract for safety at this time. She denies homicidal ideations.  Additionally denies auditory visual hallucinations.  There is no evidence of delusional thought content no indication that patient is responding to internal stimuli. She has remained appropriate and cooperative while in observation area.  Compliant with medications.  Patient offered support and encouragement.  She is aware of current plan to include inpatient psychiatric hospitalization.    Patient gives verbal consent to speak with CST team through [facility]. Unable to reach outpatient psychiatry provider at [facility] currently.  Diagnosis:  Final diagnoses:  Schizoaffective disorder, bipolar type ([place])  Suicidal ideation    Total Time spent with patient: 20 minutes  Past Psychiatric History: Schizoaffective disorder, bipolar type, major depressive disorder 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 fragile X syndrome    Chronic constipation    Depression    Drug-seeking behavior    Essential tremor    Headache    Overdose of acetaminophen 07/2017   and other meds   Personality disorder ([place])    Schizo-affective psychosis ([place])    Schizoaffective disorder, bipolar type ([place])    Seizures ([place])    Last seizure December 2017   Sleep apnea     Past Surgical History:  Procedure Laterality Date   MOUTH SURGERY  2009 or 2010   Family History:  Family  History  Problem Relation Age of Onset   Mental illness Father    Asthma Father    PDD Brother    Seizures Brother    Family Psychiatric  History: None reported Social History:  Social History   Substance and Sexual Activity  Alcohol Use No   Alcohol/week: 1.0 standard drink   Types: 1 Standard drinks or equivalent per week   Comment: denies at this time     Social History   Substance and Sexual Activity  Drug Use No   Comment: History of cocaine use at age 68 for 4 months    Social History   Socioeconomic History   Marital status: Widowed    Spouse name: Not on file   Number of children: 0   Years of education: Not on file   Highest education level: Not on file  Occupational History   Occupation: disability  Tobacco Use   Smoking status: Former    Packs/day: 0.00    Types: Cigarettes   Smokeless tobacco: Never   Tobacco comments:    Smoked for 2  years age [age]  Vaping Use   Vaping Use: Never used  Substance and Sexual Activity   Alcohol use: No    Alcohol/week: 1.0 standard drink    Types: 1 Standard drinks or equivalent per week    Comment: denies at this time   Drug use: No    Comment: History of cocaine use at age 55 for 4 months   Sexual activity: Yes    Birth control/protection: None  Other  Topics Concern   Not on file  Social History Narrative   Marital status: Widowed      Children: daughter      Lives: with boyfriend, in two story home      Employment:  Disability      Tobacco: quit smoking; smoked for two years.      Alcohol ;none      Drugs: none   Has not traveled outside of the country.   Right handed         Social Determinants of Health   Financial Resource Strain: Not on file  Food Insecurity: Not on file  Transportation Needs: Not on file  Physical Activity: Not on file  Stress: Not on file  Social Connections: Not on file   SDOH:  SDOH Screenings   Alcohol Screen: Not on file  Depression (PHQ2-9): Not on file   Financial Resource Strain: Not on file  Food Insecurity: Not on file  Housing: Not on file  Physical Activity: Not on file  Social Connections: Not on file  Stress: Not on file  Tobacco Use: Medium Risk   Smoking Tobacco Use: Former   Smokeless Tobacco Use: Never   Passive Exposure: Not on file  Transportation Needs: Not on file   Additional Social History:    Pain Medications: See MAR Prescriptions: See MAR Over the Counter: See MAR History of alcohol / drug use?: No history of alcohol / drug abuse Longest period of sobriety (when/how long): NA Negative Consequences of Use:  (never used drugs/alcohol) Withdrawal Symptoms: Other (Comment)                    Sleep: Good  Appetite:  Fair  Current Medications:  Current Facility-Administered Medications  Medication Dose Route Frequency Provider Last Rate Last Admin   acetaminophen (TYLENOL) tablet 650 mg  650 mg Oral Q6H PRN Lucky Rathke, FNP       alum & mag hydroxide-simeth (MAALOX/MYLANTA) 200-200-20 MG/5ML suspension 30 mL  30 mL Oral Q4H PRN Lucky Rathke, FNP       hydrOXYzine (ATARAX) tablet 25 mg  25 mg Oral TID PRN Lucky Rathke, FNP   25 mg at 03/03/21 1539   magnesium hydroxide (MILK OF MAGNESIA) suspension 30 mL  30 mL Oral Daily PRN Lucky Rathke, FNP       risperiDONE (RISPERDAL) tablet 3 mg  3 mg Oral QHS Lucky Rathke, FNP   3 mg at 03/02/21 2226   sertraline (ZOLOFT) tablet 150 mg  150 mg Oral Daily Lucky Rathke, FNP   150 mg at 03/03/21 1012   traZODone (DESYREL) tablet 50 mg  50 mg Oral QHS PRN Lucky Rathke, FNP       Current Outpatient Medications  Medication Sig Dispense Refill   albuterol (PROVENTIL HFA;VENTOLIN HFA) 108 (90 Base) MCG/ACT inhaler Inhale 1-2 puffs into the lungs every 6 (six) hours as needed for wheezing or shortness of breath.     melatonin 3 MG TABS tablet Take 3 mg by mouth at bedtime.     ondansetron (ZOFRAN) 4 MG tablet Take 1 tablet (4 mg total) by mouth every 6 (six)  hours. 12 tablet 0   pantoprazole (PROTONIX) 40 MG tablet Take 40 mg by mouth daily.     risperiDONE (RISPERDAL) 3 MG tablet Take 1 tablet (3 mg total) by mouth at bedtime. 30 tablet 0   sertraline (ZOLOFT) 50 MG tablet Take 3 tablets (150 mg total)  by mouth daily. 90 tablet 0    Labs  Lab Results:  Admission on 03/02/2021  Component Date Value Ref Range Status   SARS Coronavirus 2 by RT PCR 03/02/2021 NEGATIVE  NEGATIVE Final   Comment: (NOTE) SARS-CoV-2 target nucleic acids are NOT DETECTED.  The SARS-CoV-2 RNA is generally detectable in upper respiratory specimens during the acute phase of infection. The lowest concentration of SARS-CoV-2 viral copies this assay can detect is 138 copies/mL. A negative result does not preclude SARS-Cov-2 infection and should not be used as the sole basis for treatment or other patient management decisions. A negative result may occur with  improper specimen collection/handling, submission of specimen other than nasopharyngeal swab, presence of viral mutation(s) within the areas targeted by this assay, and inadequate number of viral copies(<138 copies/mL). A negative result must be combined with clinical observations, patient history, and epidemiological information. The expected result is Negative.  Fact Sheet for Patients:  EntrepreneurPulse.com.au  Fact Sheet for Healthcare Providers:  IncredibleEmployment.be  This test is no                          t yet approved or cleared by the [place] FDA and  has been authorized for detection and/or diagnosis of SARS-CoV-2 by FDA under an Emergency Use Authorization (EUA). This EUA will remain  in effect (meaning this test can be used) for the duration of the COVID-19 declaration under Section 564(b)(1) of the Act, 21 U.S.C.section 360bbb-3(b)(1), unless the authorization is terminated  or revoked sooner.       Influenza A by PCR 03/02/2021 NEGATIVE   NEGATIVE Final   Influenza B by PCR 03/02/2021 NEGATIVE  NEGATIVE Final   Comment: (NOTE) The Xpert Xpress SARS-CoV-2/FLU/RSV plus assay is intended as an aid in the diagnosis of influenza from Nasopharyngeal swab specimens and should not be used as a sole basis for treatment. Nasal washings and aspirates are unacceptable for Xpert Xpress SARS-CoV-2/FLU/RSV testing.  Fact Sheet for Patients: EntrepreneurPulse.com.au  Fact Sheet for Healthcare Providers: IncredibleEmployment.be  This test is not yet approved or cleared by the [place] FDA and has been authorized for detection and/or diagnosis of SARS-CoV-2 by FDA under an Emergency Use Authorization (EUA). This EUA will remain in effect (meaning this test can be used) for the duration of the COVID-19 declaration under Section 564(b)(1) of the Act, 21 U.S.C. section 360bbb-3(b)(1), unless the authorization is terminated or revoked.  Performed at [facility], [place] [address] [place], [place] [address]    WBC 03/02/2021 7.6  4.0 - 10.5 K/uL Final   RBC 03/02/2021 4.99  3.87 - 5.11 MIL/uL Final   Hemoglobin 03/02/2021 13.0  12.0 - 15.0 g/dL Final   HCT 03/02/2021 40.3  36.0 - 46.0 % Final   MCV 03/02/2021 80.8  80.0 - 100.0 fL Final   MCH 03/02/2021 26.1  26.0 - 34.0 pg Final   MCHC 03/02/2021 32.3  30.0 - 36.0 g/dL Final   RDW 03/02/2021 13.4  11.5 - 15.5 % Final   Platelets 03/02/2021 272  150 - 400 K/uL Final   nRBC 03/02/2021 0.0  0.0 - 0.2 % Final   Neutrophils Relative % 03/02/2021 65  % Final   Neutro Abs 03/02/2021 4.9  1.7 - 7.7 K/uL Final   Lymphocytes Relative 03/02/2021 30  % Final   Lymphs Abs 03/02/2021 2.2  0.7 - 4.0 K/uL Final   Monocytes Relative 03/02/2021 5  % Final  Monocytes Absolute 03/02/2021 0.4  0.1 - 1.0 K/uL Final   Eosinophils Relative 03/02/2021 0  % Final   Eosinophils Absolute 03/02/2021 0.0  0.0 - 0.5 K/uL Final   Basophils Relative 03/02/2021 0  %  Final   Basophils Absolute 03/02/2021 0.0  0.0 - 0.1 K/uL Final   Immature Granulocytes 03/02/2021 0  % Final   Abs Immature Granulocytes 03/02/2021 0.03  0.00 - 0.07 K/uL Final   Performed at [facility], [place] [address] [place], [place] [address]   Sodium 03/02/2021 137  135 - 145 mmol/L Final   Potassium 03/02/2021 3.9  3.5 - 5.1 mmol/L Final   Chloride 03/02/2021 106  98 - 111 mmol/L Final   CO2 03/02/2021 20 (L)  22 - 32 mmol/L Final   Glucose, Bld 03/02/2021 89  70 - 99 mg/dL Final   Glucose reference range applies only to samples taken after fasting for at least 8 hours.   BUN 03/02/2021 5 (L)  6 - 20 mg/dL Final   Creatinine, Ser 03/02/2021 0.69  0.44 - 1.00 mg/dL Final   Calcium 03/02/2021 9.5  8.9 - 10.3 mg/dL Final   Total Protein 03/02/2021 7.3  6.5 - 8.1 g/dL Final   Albumin 03/02/2021 4.3  3.5 - 5.0 g/dL Final   AST 03/02/2021 16  15 - 41 U/L Final   ALT 03/02/2021 16  0 - 44 U/L Final   Alkaline Phosphatase 03/02/2021 76  38 - 126 U/L Final   Total Bilirubin 03/02/2021 0.5  0.3 - 1.2 mg/dL Final   GFR, Estimated 03/02/2021 >60  >60 mL/min Final   Comment: (NOTE) Calculated using the CKD-EPI Creatinine Equation (2021)    Anion gap 03/02/2021 11  5 - 15 Final   Performed at [place] [address], [place], [place] [address]   Hgb A1c MFr Bld 03/02/2021 5.1  4.8 - 5.6 % Final   Comment: (NOTE) Pre diabetes:          5.7%-6.4%  Diabetes:              >6.4%  Glycemic control for   <7.0% adults with diabetes    Mean Plasma Glucose 03/02/2021 99.67  mg/dL Final   Performed at [facility], [place] [address] [place], [place] [address]   Magnesium 03/02/2021 2.1  1.7 - 2.4 mg/dL Final   Performed at [place] [address] [place], [place] [address]   Alcohol, Ethyl (B) 03/02/2021 <10  <10 mg/dL Final   Comment: (NOTE) Lowest detectable limit for serum alcohol is 10 mg/dL.  For medical purposes only. Performed at [facility], [place] [address] [place], [place] [address]    TSH 03/02/2021 2.216  0.350 - 4.500 uIU/mL Final   Comment: Performed by a 3rd Generation assay with a functional sensitivity of <=0.01 uIU/mL. Performed at [facility], [place] [address], [place], [place] [address]    POC Amphetamine UR 03/02/2021 None Detected  NONE DETECTED (Cut Off Level 1000 ng/mL) Final   POC Secobarbital (BAR) 03/02/2021 None Detected  NONE DETECTED (Cut Off Level 300 ng/mL) Final   POC Buprenorphine (BUP) 03/02/2021 None Detected  NONE DETECTED (Cut Off Level 10 ng/mL) Final   POC Oxazepam (BZO) 03/02/2021 None Detected  NONE DETECTED (Cut Off Level 300 ng/mL) Final   POC Cocaine UR 03/02/2021 None Detected  NONE DETECTED (Cut Off Level 300 ng/mL) Final   POC Methamphetamine UR 03/02/2021  None Detected  NONE DETECTED (Cut Off Level 1000 ng/mL) Final   POC Morphine 03/02/2021 None Detected  NONE DETECTED (Cut Off Level 300 ng/mL) Final   POC Oxycodone UR 03/02/2021 None Detected  NONE DETECTED (Cut Off Level 100 ng/mL) Final   POC Methadone UR 03/02/2021 None Detected  NONE DETECTED (Cut Off Level 300 ng/mL) Final   POC Marijuana UR 03/02/2021 None Detected  NONE DETECTED (Cut Off Level 50 ng/mL) Final   SARS Coronavirus 2 Ag 03/02/2021 Negative  Negative Preliminary   SARSCOV2ONAVIRUS 2 AG 03/02/2021 NEGATIVE  NEGATIVE Final   Comment: (NOTE) SARS-CoV-2 antigen NOT DETECTED.   Negative results are presumptive.  Negative results do not preclude SARS-CoV-2 infection and should not be used as the sole basis for treatment or other patient management decisions, including infection  control decisions, particularly in the presence of clinical signs and  symptoms consistent with COVID-19, or in those who have been in contact with the virus.  Negative results must be combined with clinical observations, patient history, and epidemiological information. The expected result is Negative.  Fact Sheet for Patients:  HandmadeRecipes.com.cy  Fact Sheet for Healthcare Providers: FuneralLife.at  This test is not yet approved or cleared by the [place] FDA and  has been authorized for detection and/or diagnosis of SARS-CoV-2 by FDA under an Emergency Use Authorization (EUA).  This EUA will remain in effect (meaning this test can be used) for the duration of  the COV                          ID-19 declaration under Section 564(b)(1) of the Act, 21 U.S.C. section 360bbb-3(b)(1), unless the authorization is terminated or revoked sooner.     Preg Test, Ur 03/02/2021 NEGATIVE  NEGATIVE Final   Comment:        THE SENSITIVITY OF THIS METHODOLOGY IS >24 mIU/mL    Cholesterol 03/02/2021 227 (H)  0 - 200 mg/dL Final   Triglycerides 03/02/2021 147  <150 mg/dL Final   HDL 03/02/2021 45  >40 mg/dL Final   Total CHOL/HDL Ratio 03/02/2021 5.0  RATIO Final   VLDL 03/02/2021 29  0 - 40 mg/dL Final   LDL Cholesterol 03/02/2021 153 (H)  0 - 99 mg/dL Final   Comment:        Total Cholesterol/HDL:CHD Risk Coronary Heart Disease Risk Table                     Men   Women  1/2 Average Risk   3.4   3.3  Average Risk       5.0   4.4  2 X Average Risk   9.6   7.1  3 X Average Risk  23.4   11.0        Use the calculated Patient Ratio above and the CHD Risk Table to determine the patient's CHD Risk.        ATP III CLASSIFICATION (LDL):  <100     mg/dL   Optimal  100-129  mg/dL   Near or Above                    Optimal  130-159  mg/dL   Borderline  160-189  mg/dL   High  >190     mg/dL   Very High Performed at [facility] [address], [place], [place] [address]   Admission on 02/25/2021, Discharged on 02/25/2021  Component  Date Value Ref Range Status   Sodium 02/25/2021 137  135 - 145 mmol/L Final   Potassium 02/25/2021 3.9  3.5 - 5.1 mmol/L Final   Chloride 02/25/2021 108  98 - 111 mmol/L Final   CO2 02/25/2021 22  22 - 32 mmol/L Final    Glucose, Bld 02/25/2021 114 (H)  70 - 99 mg/dL Final   Glucose reference range applies only to samples taken after fasting for at least 8 hours.   BUN 02/25/2021 8  6 - 20 mg/dL Final   Creatinine, Ser 02/25/2021 0.63  0.44 - 1.00 mg/dL Final   Calcium 02/25/2021 9.0  8.9 - 10.3 mg/dL Final   Total Protein 02/25/2021 7.5  6.5 - 8.1 g/dL Final   Albumin 02/25/2021 4.2  3.5 - 5.0 g/dL Final   AST 02/25/2021 19  15 - 41 U/L Final   ALT 02/25/2021 17  0 - 44 U/L Final   Alkaline Phosphatase 02/25/2021 68  38 - 126 U/L Final   Total Bilirubin 02/25/2021 0.4  0.3 - 1.2 mg/dL Final   GFR, Estimated 02/25/2021 >60  >60 mL/min Final   Comment: (NOTE) Calculated using the CKD-EPI Creatinine Equation (2021)    Anion gap 02/25/2021 7  5 - 15 Final   Performed at [facility], [place] [address] [place], [place] [address]   WBC 02/25/2021 9.2  4.0 - 10.5 K/uL Final   RBC 02/25/2021 4.77  3.87 - 5.11 MIL/uL Final   Hemoglobin 02/25/2021 12.4  12.0 - 15.0 g/dL Final   HCT 02/25/2021 39.1  36.0 - 46.0 % Final   MCV 02/25/2021 82.0  80.0 - 100.0 fL Final   MCH 02/25/2021 26.0  26.0 - 34.0 pg Final   MCHC 02/25/2021 31.7  30.0 - 36.0 g/dL Final   RDW 02/25/2021 13.7  11.5 - 15.5 % Final   Platelets 02/25/2021 253  150 - 400 K/uL Final   nRBC 02/25/2021 0.0  0.0 - 0.2 % Final   Neutrophils Relative % 02/25/2021 63  % Final   Neutro Abs 02/25/2021 5.8  1.7 - 7.7 K/uL Final   Lymphocytes Relative 02/25/2021 29  % Final   Lymphs Abs 02/25/2021 2.7  0.7 - 4.0 K/uL Final   Monocytes Relative 02/25/2021 7  % Final   Monocytes Absolute 02/25/2021 0.6  0.1 - 1.0 K/uL Final   Eosinophils Relative 02/25/2021 1  % Final   Eosinophils Absolute 02/25/2021 0.1  0.0 - 0.5 K/uL Final   Basophils Relative 02/25/2021 0  % Final   Basophils Absolute 02/25/2021 0.0  0.0 - 0.1 K/uL Final   Immature Granulocytes 02/25/2021 0  % Final   Abs Immature Granulocytes 02/25/2021 0.03  0.00 - 0.07 K/uL Final    Performed at [facility], [place] [address], [place], [place] [address]   Lipase 02/25/2021 35  11 - 51 U/L Final   Performed at [facility], [place] [address] [place], [place] [address]   Preg Test, Ur 02/25/2021 NEGATIVE  NEGATIVE Final   Comment:        THE SENSITIVITY OF THIS METHODOLOGY IS >20 mIU/mL. Performed at [facility], [place] [address] [place], [place] [address]   Admission on 12/01/2020, Discharged on 12/03/2020  Component Date Value Ref Range Status   SARS Coronavirus 2 by RT PCR 12/01/2020 NEGATIVE  NEGATIVE Final   Comment: (NOTE) SARS-CoV-2 target nucleic acids are NOT DETECTED.  The SARS-CoV-2 RNA is generally detectable in upper  respiratory specimens during the acute phase of infection. The lowest concentration of SARS-CoV-2 viral copies this assay can detect is 138 copies/mL. A negative result does not preclude SARS-Cov-2 infection and should not be used as the sole basis for treatment or other patient management decisions. A negative result may occur with  improper specimen collection/handling, submission of specimen other than nasopharyngeal swab, presence of viral mutation(s) within the areas targeted by this assay, and inadequate number of viral copies(<138 copies/mL). A negative result must be combined with clinical observations, patient history, and epidemiological information. The expected result is Negative.  Fact Sheet for Patients:  EntrepreneurPulse.com.au  Fact Sheet for Healthcare Providers:  IncredibleEmployment.be  This test is no                          t yet approved or cleared by the [place] FDA and  has been authorized for detection and/or diagnosis of SARS-CoV-2 by FDA under an Emergency Use Authorization (EUA). This EUA will remain  in effect (meaning this test can be used) for the duration of the COVID-19 declaration under Section 564(b)(1)  of the Act, 21 U.S.C.section 360bbb-3(b)(1), unless the authorization is terminated  or revoked sooner.       Influenza A by PCR 12/01/2020 NEGATIVE  NEGATIVE Final   Influenza B by PCR 12/01/2020 NEGATIVE  NEGATIVE Final   Comment: (NOTE) The Xpert Xpress SARS-CoV-2/FLU/RSV plus assay is intended as an aid in the diagnosis of influenza from Nasopharyngeal swab specimens and should not be used as a sole basis for treatment. Nasal washings and aspirates are unacceptable for Xpert Xpress SARS-CoV-2/FLU/RSV testing.  Fact Sheet for Patients: EntrepreneurPulse.com.au  Fact Sheet for Healthcare Providers: IncredibleEmployment.be  This test is not yet approved or cleared by the [place] FDA and has been authorized for detection and/or diagnosis of SARS-CoV-2 by FDA under an Emergency Use Authorization (EUA). This EUA will remain in effect (meaning this test can be used) for the duration of the COVID-19 declaration under Section 564(b)(1) of the Act, 21 U.S.C. section 360bbb-3(b)(1), unless the authorization is terminated or revoked.  Performed at [facility], [place] [address] [place], [place] [address]    Preg Test, Ur 12/01/2020 NEGATIVE  NEGATIVE Final   Comment:        THE SENSITIVITY OF THIS METHODOLOGY IS >20 mIU/mL. Performed at [facility], [place] [address] [place], [place] [address]    POC Amphetamine UR 12/01/2020 None Detected  NONE DETECTED (Cut Off Level 1000 ng/mL) Final   POC Secobarbital (BAR) 12/01/2020 None Detected  NONE DETECTED (Cut Off Level 300 ng/mL) Final   POC Buprenorphine (BUP) 12/01/2020 None Detected  NONE DETECTED (Cut Off Level 10 ng/mL) Final   POC Oxazepam (BZO) 12/01/2020 None Detected  NONE DETECTED (Cut Off Level 300 ng/mL) Final   POC Cocaine UR 12/01/2020 None Detected  NONE DETECTED (Cut Off Level 300 ng/mL) Final   POC Methamphetamine UR 12/01/2020 None Detected  NONE DETECTED (Cut Off  Level 1000 ng/mL) Final   POC Morphine 12/01/2020 None Detected  NONE DETECTED (Cut Off Level 300 ng/mL) Final   POC Oxycodone UR 12/01/2020 None Detected  NONE DETECTED (Cut Off Level 100 ng/mL) Final   POC Methadone UR 12/01/2020 None Detected  NONE DETECTED (Cut Off Level 300 ng/mL) Final   POC Marijuana UR 12/01/2020 None Detected  NONE DETECTED (Cut Off Level 50 ng/mL) Final   Color, Urine 12/01/2020 YELLOW  YELLOW Final   APPearance 12/01/2020 HAZY (A)  CLEAR Final   Specific Gravity, Urine 12/01/2020 1.015  1.005 - 1.030 Final   pH 12/01/2020 5.0  5.0 - 8.0 Final   Glucose, UA 12/01/2020 NEGATIVE  NEGATIVE mg/dL Final   Hgb urine dipstick 12/01/2020 NEGATIVE  NEGATIVE Final   Bilirubin Urine 12/01/2020 NEGATIVE  NEGATIVE Final   Ketones, ur 12/01/2020 NEGATIVE  NEGATIVE mg/dL Final   Protein, ur 12/01/2020 NEGATIVE  NEGATIVE mg/dL Final   Nitrite 12/01/2020 NEGATIVE  NEGATIVE Final   Leukocytes,Ua 12/01/2020 TRACE (A)  NEGATIVE Final   RBC / HPF 12/01/2020 0-5  0 - 5 RBC/hpf Final   WBC, UA 12/01/2020 0-5  0 - 5 WBC/hpf Final   Bacteria, UA 12/01/2020 RARE (A)  NONE SEEN Final   Squamous Epithelial / LPF 12/01/2020 0-5  0 - 5 Final   Mucus 12/01/2020 PRESENT   Final   Performed at [facility], [place] [address], [place], [place] [address]   SARSCOV2ONAVIRUS 2 AG 12/01/2020 NEGATIVE  NEGATIVE Final   Comment: (NOTE) SARS-CoV-2 antigen NOT DETECTED.   Negative results are presumptive.  Negative results do not preclude SARS-CoV-2 infection and should not be used as the sole basis for treatment or other patient management decisions, including infection  control decisions, particularly in the presence of clinical signs and  symptoms consistent with COVID-19, or in those who have been in contact with the virus.  Negative results must be combined with clinical observations, patient history, and epidemiological information. The expected result is Negative.  Fact Sheet for  Patients: HandmadeRecipes.com.cy  Fact Sheet for Healthcare Providers: FuneralLife.at  This test is not yet approved or cleared by the [place] FDA and  has been authorized for detection and/or diagnosis of SARS-CoV-2 by FDA under an Emergency Use Authorization (EUA).  This EUA will remain in effect (meaning this test can be used) for the duration of  the COV                          ID-19 declaration under Section 564(b)(1) of the Act, 21 U.S.C. section 360bbb-3(b)(1), unless the authorization is terminated or revoked sooner.     Preg Test, Ur 12/01/2020 NEGATIVE  NEGATIVE Final   Comment:        THE SENSITIVITY OF THIS METHODOLOGY IS >24 mIU/mL   Admission on 11/21/2020, Discharged on 11/22/2020  Component Date Value Ref Range Status   SARS Coronavirus 2 by RT PCR 11/21/2020 NEGATIVE  NEGATIVE Final   Comment: (NOTE) SARS-CoV-2 target nucleic acids are NOT DETECTED.  The SARS-CoV-2 RNA is generally detectable in upper respiratory specimens during the acute phase of infection. The lowest concentration of SARS-CoV-2 viral copies this assay can detect is 138 copies/mL. A negative result does not preclude SARS-Cov-2 infection and should not be used as the sole basis for treatment or other patient management decisions. A negative result may occur with  improper specimen collection/handling, submission of specimen other than nasopharyngeal swab, presence of viral mutation(s) within the areas targeted by this assay, and inadequate number of viral copies(<138 copies/mL). A negative result must be combined with clinical observations, patient history, and epidemiological information. The expected result is Negative.  Fact Sheet for Patients:  EntrepreneurPulse.com.au  Fact Sheet for Healthcare Providers:  IncredibleEmployment.be  This test is no  t yet approved or  cleared by the [place] and  has been authorized for detection and/or diagnosis of SARS-CoV-2 by FDA under an Emergency Use Authorization (EUA). This EUA will remain  in effect (meaning this test can be used) for the duration of the COVID-19 declaration under Section 564(b)(1) of the Act, 21 U.S.C.section 360bbb-3(b)(1), unless the authorization is terminated  or revoked sooner.       Influenza A by PCR 11/21/2020 NEGATIVE  NEGATIVE Final   Influenza B by PCR 11/21/2020 NEGATIVE  NEGATIVE Final   Comment: (NOTE) The Xpert Xpress SARS-CoV-2/FLU/RSV plus assay is intended as an aid in the diagnosis of influenza from Nasopharyngeal swab specimens and should not be used as a sole basis for treatment. Nasal washings and aspirates are unacceptable for Xpert Xpress SARS-CoV-2/FLU/RSV testing.  Fact Sheet for Patients: EntrepreneurPulse.com.au  Fact Sheet for Healthcare Providers: IncredibleEmployment.be  This test is not yet approved or cleared by the [place] FDA and has been authorized for detection and/or diagnosis of SARS-CoV-2 by FDA under an Emergency Use Authorization (EUA). This EUA will remain in effect (meaning this test can be used) for the duration of the COVID-19 declaration under Section 564(b)(1) of the Act, 21 U.S.C. section 360bbb-3(b)(1), unless the authorization is terminated or revoked.  Performed at [facility], [place] [address] [place], [place] [address]    I-stat hCG, quantitative 11/21/2020 <5.0  <5 mIU/mL Final   Comment 3 11/21/2020          Final   Comment:   GEST. AGE      CONC.  (mIU/mL)   <=1 WEEK        5 - 50     2 WEEKS       50 - 500     3 WEEKS       100 - 10,000     4 WEEKS     1,000 - 30,000        FEMALE AND NON-PREGNANT FEMALE:     LESS THAN 5 mIU/mL    Glucose-Capillary 11/21/2020 90  70 - 99 mg/dL Final   Glucose reference range applies only to samples taken after  fasting for at least 8 hours.   Sodium 11/21/2020 136  135 - 145 mmol/L Final   Potassium 11/21/2020 4.2  3.5 - 5.1 mmol/L Final   Chloride 11/21/2020 106  98 - 111 mmol/L Final   CO2 11/21/2020 20 (L)  22 - 32 mmol/L Final   Glucose, Bld 11/21/2020 95  70 - 99 mg/dL Final   Glucose reference range applies only to samples taken after fasting for at least 8 hours.   BUN 11/21/2020 10  6 - 20 mg/dL Final   Creatinine, Ser 11/21/2020 0.68  0.44 - 1.00 mg/dL Final   Calcium 11/21/2020 9.1  8.9 - 10.3 mg/dL Final   Total Protein 11/21/2020 7.7  6.5 - 8.1 g/dL Final   Albumin 11/21/2020 4.0  3.5 - 5.0 g/dL Final   AST 11/21/2020 28  15 - 41 U/L Final   ALT 11/21/2020 20  0 - 44 U/L Final   Alkaline Phosphatase 11/21/2020 63  38 - 126 U/L Final   Total Bilirubin 11/21/2020 0.6  0.3 - 1.2 mg/dL Final   GFR, Estimated 11/21/2020 >60  >60 mL/min Final   Comment: (NOTE) Calculated using the CKD-EPI Creatinine Equation (2021)    Anion gap 11/21/2020 10  5 - 15 Final   Performed at [facility], [place] [address], [place], [place] [address]   Salicylate Lvl 93/81/0175 <7.0 (L)  7.0 - 30.0 mg/dL Final   Performed at [place] [address] [place], [place] [address]   Acetaminophen (Tylenol), Serum 11/21/2020 <10 (L)  10 - 30 ug/mL Final   Comment: (NOTE) Therapeutic concentrations vary significantly. A range of 10-30 ug/mL  may be an effective concentration for many patients. However, some  are best treated at concentrations outside of this range. Acetaminophen concentrations >150 ug/mL at 4 hours after ingestion  and >50 ug/mL at 12 hours after ingestion are often associated with  toxic reactions.  Performed at [facility], [place] [address], [place], [place] [address]    Alcohol, Ethyl (B) 11/21/2020 <10  <10 mg/dL Final   Comment: (NOTE) Lowest detectable limit for serum alcohol is 10 mg/dL.  For medical purposes only. Performed  at [place], [place] [address], [place], [place] [address]    Opiates 11/21/2020 NONE DETECTED  NONE DETECTED Final   Cocaine 11/21/2020 NONE DETECTED  NONE DETECTED Final   Benzodiazepines 11/21/2020 NONE DETECTED  NONE DETECTED Final   Amphetamines 11/21/2020 NONE DETECTED  NONE DETECTED Final   Tetrahydrocannabinol 11/21/2020 NONE DETECTED  NONE DETECTED Final   Barbiturates 11/21/2020 NONE DETECTED  NONE DETECTED Final   Comment: (NOTE) DRUG SCREEN FOR MEDICAL PURPOSES ONLY.  IF CONFIRMATION IS NEEDED FOR ANY PURPOSE, NOTIFY LAB WITHIN 5 DAYS.  LOWEST DETECTABLE LIMITS FOR URINE DRUG SCREEN Drug Class                     Cutoff (ng/mL) Amphetamine and metabolites    1000 Barbiturate and metabolites    200 Benzodiazepine                 361 Tricyclics and metabolites     300 Opiates and metabolites        300 Cocaine and metabolites        300 THC                            50 Performed at [facility], [place] [address] [place], [place] [address]    WBC 11/21/2020 9.8  4.0 - 10.5 K/uL Final   RBC 11/21/2020 4.58  3.87 - 5.11 MIL/uL Final   Hemoglobin 11/21/2020 12.0  12.0 - 15.0 g/dL Final   HCT 11/21/2020 38.0  36.0 - 46.0 % Final   MCV 11/21/2020 83.0  80.0 - 100.0 fL Final   MCH 11/21/2020 26.2  26.0 - 34.0 pg Final   MCHC 11/21/2020 31.6  30.0 - 36.0 g/dL Final   RDW 11/21/2020 14.1  11.5 - 15.5 % Final   Platelets 11/21/2020 231  150 - 400 K/uL Final   nRBC 11/21/2020 0.0  0.0 - 0.2 % Final   Neutrophils Relative % 11/21/2020 72  % Final   Neutro Abs 11/21/2020 7.0  1.7 - 7.7 K/uL Final   Lymphocytes Relative 11/21/2020 21  % Final   Lymphs Abs 11/21/2020 2.1  0.7 - 4.0 K/uL Final   Monocytes Relative 11/21/2020 6  % Final   Monocytes Absolute 11/21/2020 0.6  0.1 - 1.0 K/uL Final   Eosinophils Relative 11/21/2020 0  % Final   Eosinophils Absolute 11/21/2020 0.0  0.0 - 0.5 K/uL Final   Basophils Relative 11/21/2020 0  % Final    Basophils Absolute 11/21/2020 0.0  0.0 - 0.1 K/uL Final   Immature Granulocytes 11/21/2020 1  % Final   Abs Immature Granulocytes 11/21/2020 0.05  0.00 - 0.07 K/uL Final   Performed at [facility], [place] [address] [place], [place] [address]    Blood Alcohol level:  Lab Results  Component Value Date   ETH <10 03/02/2021   ETH <10 40/81/4481    Metabolic Disorder Labs: Lab Results  Component Value Date   HGBA1C 5.1 03/02/2021   MPG 99.67 03/02/2021   MPG 96.8 07/02/2019   Lab Results  Component Value Date   PROLACTIN 66.1 (H) 02/04/2020   PROLACTIN 129.0 (H) 01/19/2020   Lab Results  Component Value Date   CHOL 227 (H) 03/02/2021   TRIG 147 03/02/2021   HDL 45 03/02/2021   CHOLHDL 5.0 03/02/2021   VLDL 29 03/02/2021   LDLCALC 153 (H) 03/02/2021   LDLCALC 92 01/18/2020    Therapeutic Lab Levels: No results found for: LITHIUM Lab Results  Component Value Date   VALPROATE 48 (L) 07/10/2015   VALPROATE 84 05/31/2015   No components found for:  CBMZ  Physical Findings   AIMS    Flowsheet Row Admission (Discharged) from 02/04/2020 in [place] [facility] Admission (Discharged) from 01/17/2020 in [place] [facility] Admission (Discharged) from 07/08/2019 in [place] 300B Admission (Discharged) from 07/01/2019 in [place] 300B Admission (Discharged) from 03/05/2019 in [place] 300B  AIMS Total Score 0 0 0 0 0      AUDIT    Flowsheet Row Admission (Discharged) from 02/04/2020 in [place] 300B Admission (Discharged) from 01/17/2020 in [place] [facility] Admission (Discharged) from 07/08/2019 in [place] 300B Admission (Discharged) from 07/01/2019 in [place] 300B Admission (Discharged) from  03/05/2019 in [place] 300B  Alcohol Use Disorder Identification Test Final Score (AUDIT) 0 0 0 0 0      GAD-7    Flowsheet Row Office Visit from 08/20/2016 in [place] for [place] from 04/03/2016 in [place] for [facility] Routine Prenatal from 03/14/2016 in [place] for [facility] Routine Prenatal from 03/08/2016 in [place] for [facility] Routine Prenatal from 03/01/2016 in [place] for [facility]  Total GAD-7 Score 1 19 13 9 5       PHQ2-9    [place] ED from 02/03/2020 in [facility] ED from 07/26/2019 in [facility] Office Visit from 08/20/2016 in [place] for [place] from 04/03/2016 in [place] for [facility] Routine Prenatal from 03/14/2016 in [place] for [facility]  PHQ-2 Total Score 6 5 0 3 2  PHQ-9 Total Score 27 15 1 16 8       Flowsheet Row ED from 03/02/2021 in [facility] ED from 02/25/2021 in [place] DEPT ED from 12/01/2020 in [place] CATEGORY High Risk No Risk High Risk        Musculoskeletal  Strength & Muscle Tone: within normal limits Gait & Station: normal Patient leans: N/A  Psychiatric Specialty Exam  Presentation  General Appearance: Appropriate for Environment; Casual  Eye Contact:Good  Speech:Clear and Coherent; Normal Rate  Speech Volume:Normal  Handedness:Right   Mood and Affect  Mood:Depressed  Affect:Depressed   Thought Process  Thought Processes:Coherent; Goal Directed; Linear  Descriptions of Associations:Intact  Orientation:Full (Time, Place and Person)  Thought Content:Logical; WDL  Diagnosis of Schizophrenia or Schizoaffective disorder in past:  No    Hallucinations:Hallucinations: None  Ideas of Reference:None  Suicidal Thoughts:Suicidal Thoughts: Yes, Passive SI Active Intent and/or Plan: With Intent; With Plan SI Passive Intent and/or Plan: With Intent; With Plan  Homicidal Thoughts:Homicidal Thoughts: No   Sensorium  Memory:Immediate Good; Recent Good  Judgment:Fair  Insight:Lacking   Executive Functions  Concentration:Good  Attention Span:Good  [place]  Language:Good   Psychomotor Activity  Psychomotor Activity:Psychomotor Activity: Normal   Assets  Assets:Communication Skills; Housing; Intimacy; Leisure Time; Physical Health   Sleep  Sleep:Sleep: Fair   Nutritional Assessment (For OBS and FBC admissions only) Has the patient had a weight loss or gain of 10 pounds or more in the last 3 months?: No Has the patient had a decrease in food intake/or appetite?: No Does the patient have dental problems?: No Does the patient have eating habits or behaviors that may be indicators of an eating disorder including binging or inducing vomiting?: No Has the patient recently lost weight without trying?: 0 Has the patient been eating poorly because of a decreased appetite?: 0 Malnutrition Screening Tool Score: 0    Physical Exam  Physical Exam Vitals and nursing note reviewed.  Constitutional:      Appearance: Normal appearance. She is well-developed.  HENT:     Head: Normocephalic and atraumatic.     Nose: Nose normal.  Cardiovascular:     Rate and Rhythm: Normal rate.  Pulmonary:     Effort: Pulmonary effort is normal.  Musculoskeletal:        General: Normal range of motion.     Cervical back: Normal range of motion.  Skin:    General: Skin is warm and dry.     Comments: Superficial abrasion noted to left wrist approximately 1 inch in length, no drainage, no warmth, no sign of inflammation, no pain reported  Neurological:     Mental Status: She is alert and  oriented to person, place, and time.  Psychiatric:        Attention and Perception: Attention and perception normal.        Mood and Affect: Affect normal. Mood is depressed.        Speech: Speech normal.        Behavior: Behavior normal. Behavior is cooperative.        Thought Content: Thought content includes suicidal ideation.        Cognition and Memory: Cognition and memory normal.        Judgment: Judgment normal.   Review of Systems  Constitutional: Negative.   HENT: Negative.    Eyes: Negative.   Respiratory: Negative.    Cardiovascular: Negative.   Gastrointestinal: Negative.   Genitourinary: Negative.   Musculoskeletal: Negative.   Neurological: Negative.   Endo/Heme/Allergies: Negative.   Psychiatric/Behavioral:  Positive for depression and suicidal ideas.   Blood pressure 112/75, pulse 96, temperature 98.3 F (36.8 C), temperature source Oral, resp. rate 18, SpO2 98 %. There is no height or weight on file to calculate BMI.  Treatment Plan Summary: Daily contact with patient to assess and evaluate symptoms and progress in treatment Patient reviewed with Dr. Serafina Mitchell.  She remains voluntary at this time.  Continue to recommend inpatient psychiatric hospitalization.  Patient is medically cleared for inpatient psychiatric hospitalization at this time.  Lucky Rathke, FNP 03/03/2021 4:21 PM

## 2021-03-03 NOTE — Progress Notes (Signed)
Patient has been denied by [facility] due to no appropriate beds available. Patient meets [place] inpatient criteria per Beatriz Stallion, NP. Patient has been faxed out to the following facilities:   [facility]  [address], [place] [address] [phone] [place]  [address], [place] [address] [phone]  [place]  [address], [place] [address] [phone]  [place]  [address] [place] [address] [phone]  [facility]  [place], [place] [address] [place], [place] [address] [facility]  [address] [place] [address] [phone] [facility]  [address] [place] [address] [phone] [facility]  [address], [place] [address] [phone]  [facility]  [address] [place] [address] [phone] [place], MSW, LCSW-A  2:10 PM 03/03/2021

## 2021-03-03 NOTE — Progress Notes (Signed)
Provider order IM Zyprexa 5 mg due to patients self injurious Behavior.

## 2021-03-04 DIAGNOSIS — Z9152 Personal history of nonsuicidal self-harm: Secondary | ICD-10-CM | POA: Diagnosis not present

## 2021-03-04 DIAGNOSIS — S60812A Abrasion of left wrist, initial encounter: Secondary | ICD-10-CM | POA: Diagnosis not present

## 2021-03-04 DIAGNOSIS — F4310 Post-traumatic stress disorder, unspecified: Secondary | ICD-10-CM | POA: Diagnosis not present

## 2021-03-04 DIAGNOSIS — K219 Gastro-esophageal reflux disease without esophagitis: Secondary | ICD-10-CM | POA: Diagnosis not present

## 2021-03-04 DIAGNOSIS — Q992 Fragile X chromosome: Secondary | ICD-10-CM | POA: Diagnosis not present

## 2021-03-04 DIAGNOSIS — F603 Borderline personality disorder: Secondary | ICD-10-CM | POA: Diagnosis not present

## 2021-03-04 DIAGNOSIS — K5900 Constipation, unspecified: Secondary | ICD-10-CM | POA: Diagnosis not present

## 2021-03-04 DIAGNOSIS — Z9151 Personal history of suicidal behavior: Secondary | ICD-10-CM | POA: Diagnosis not present

## 2021-03-04 DIAGNOSIS — J45909 Unspecified asthma, uncomplicated: Secondary | ICD-10-CM | POA: Diagnosis not present

## 2021-03-04 DIAGNOSIS — F250 Schizoaffective disorder, bipolar type: Secondary | ICD-10-CM | POA: Diagnosis not present

## 2021-03-04 DIAGNOSIS — F840 Autistic disorder: Secondary | ICD-10-CM | POA: Diagnosis not present

## 2021-03-04 DIAGNOSIS — F332 Major depressive disorder, recurrent severe without psychotic features: Secondary | ICD-10-CM | POA: Diagnosis not present

## 2021-03-04 DIAGNOSIS — G43909 Migraine, unspecified, not intractable, without status migrainosus: Secondary | ICD-10-CM | POA: Diagnosis not present

## 2021-03-04 DIAGNOSIS — R45851 Suicidal ideations: Secondary | ICD-10-CM | POA: Diagnosis not present

## 2021-03-04 DIAGNOSIS — Z87891 Personal history of nicotine dependence: Secondary | ICD-10-CM | POA: Diagnosis not present

## 2021-03-04 DIAGNOSIS — Z560 Unemployment, unspecified: Secondary | ICD-10-CM | POA: Diagnosis not present

## 2021-03-04 DIAGNOSIS — Z882 Allergy status to sulfonamides status: Secondary | ICD-10-CM | POA: Diagnosis not present

## 2021-03-04 DIAGNOSIS — G40909 Epilepsy, unspecified, not intractable, without status epilepticus: Secondary | ICD-10-CM | POA: Diagnosis not present

## 2021-03-04 DIAGNOSIS — Z885 Allergy status to narcotic agent status: Secondary | ICD-10-CM | POA: Diagnosis not present

## 2021-03-04 MED ORDER — SERTRALINE HCL 50 MG PO TABS: 150.0000 mg | ORAL_TABLET | Freq: Every day | ORAL | Status: DC

## 2021-03-04 NOTE — ED Notes (Signed)
Pt alert x3 no self injurious behavior noted at this time PRN zyprexa and benadryl held for now. Affect remains flat mood remains labile and attention seeking behavior continues.  Left anterior wrist area has 1cm superficial skin abrasion reddened but no drainage. [place] has request and given po vistaril per order.

## 2021-03-04 NOTE — ED Provider Notes (Signed)
FBC/OBS ASAP Discharge Summary  Date and Time: 03/04/2021 9:28 AM  Name: [name]  MRN:  [id number]   Discharge Diagnoses:  Final diagnoses:  Schizoaffective disorder, bipolar type ([place])  Suicidal ideation    Subjective:  [name], 31 y.o., female patient who initially presented to [facility] by law enforcement due to [place].  She was admitted to the continuous assessment unit for overnight observation.  She was reassessed and recommended for inpatient psychiatric admission.  She has remained on the unit while awaiting inpatient psychiatric bed availability.  Patient seen face to face by this provider, Dr. Dwyane Dee; and  chart reviewed on  03/04/21.    Per chart review patient has been diagnosed with schizoaffective disorder, bipolar type, MDD, SI, and autism spectrum disorder she is prescribed risperidone 3 mg nightly, Zoloft for her daily mg daily and hydroxyzine as needed.  Patient has medication management and counseling through [facility].  Patient has multiple inpatient psychiatric admissions.  She has a history of 16 previous suicide attempts, her most recent attempt in 01/2020.  During evaluation Anjana Cheek is sitting in her bed.  She is alert/oriented x4 and cooperative.  She makes fair eye contact.  Her speech is clear, coherent, normal rate and tone.  She continues to endorse depression and anxiety.  Objectively she does not appear to be responding to internal/external stimuli.  She denies AVH.  She denies HI.  She continues to endorses SI with a plan to overdose on medications.  She cannot contract for safety.   Discussed inpatient psychiatric admission with patient she agreed    Stay Summary:   Patient continues to meet inpatient psychiatric admission criteria.  She has been accepted to [facility] and can arrive today.  Total Time spent with patient: 20 minutes  Past Psychiatric History: See H&P Past Medical History:  Past Medical History:   Diagnosis Date   Acid reflux    Anxiety    Asthma    last attack 03/13/15 or 03/14/15   Autism    Carrier of fragile X syndrome    Chronic constipation    Depression    Drug-seeking behavior    Essential tremor    Headache    Overdose of acetaminophen 07/2017   and other meds   Personality disorder ([place])    Schizo-affective psychosis ([place])    Schizoaffective disorder, bipolar type ([place])    Seizures ([place])    Last seizure December 2017   Sleep apnea     Past Surgical History:  Procedure Laterality Date   MOUTH SURGERY  2009 or 2010   Family History:  Family History  Problem Relation Age of Onset   Mental illness Father    Asthma Father    PDD Brother    Seizures Brother    Family Psychiatric History: See H&P Social History:  Social History   Substance and Sexual Activity  Alcohol Use No   Alcohol/week: 1.0 standard drink   Types: 1 Standard drinks or equivalent per week   Comment: denies at this time     Social History   Substance and Sexual Activity  Drug Use No   Comment: History of cocaine use at age 17 for 4 months    Social History   Socioeconomic History   Marital status: Widowed    Spouse name: Not on file   Number of children: 0   Years of education: Not on file   Highest education level: Not on file  Occupational History   Occupation: disability  Tobacco Use   Smoking status: Former    Packs/day: 0.00    Types: Cigarettes   Smokeless tobacco: Never   Tobacco comments:    Smoked for 2  years age [age]  Vaping Use   Vaping Use: Never used  Substance and Sexual Activity   Alcohol use: No    Alcohol/week: 1.0 standard drink    Types: 1 Standard drinks or equivalent per week    Comment: denies at this time   Drug use: No    Comment: History of cocaine use at age 81 for 4 months   Sexual activity: Yes    Birth control/protection: None  Other Topics Concern   Not on file  Social History Narrative   Marital status: Widowed      Children:  daughter      Lives: with boyfriend, in two story home      Employment:  Disability      Tobacco: quit smoking; smoked for two years.      Alcohol ;none      Drugs: none   Has not traveled outside of the country.   Right handed         Social Determinants of Health   Financial Resource Strain: Not on file  Food Insecurity: Not on file  Transportation Needs: Not on file  Physical Activity: Not on file  Stress: Not on file  Social Connections: Not on file   SDOH:  SDOH Screenings   Alcohol Screen: Not on file  Depression (PHQ2-9): Not on file  Financial Resource Strain: Not on file  Food Insecurity: Not on file  Housing: Not on file  Physical Activity: Not on file  Social Connections: Not on file  Stress: Not on file  Tobacco Use: Medium Risk   Smoking Tobacco Use: Former   Smokeless Tobacco Use: Never   Passive Exposure: Not on file  Transportation Needs: Not on file    Tobacco Cessation:  N/A, patient does not currently use tobacco products  Current Medications:  Current Facility-Administered Medications  Medication Dose Route Frequency Provider Last Rate Last Admin   acetaminophen (TYLENOL) tablet 650 mg  650 mg Oral Q6H PRN Lucky Rathke, FNP   650 mg at 03/03/21 1810   alum & mag hydroxide-simeth (MAALOX/MYLANTA) 200-200-20 MG/5ML suspension 30 mL  30 mL Oral Q4H PRN Lucky Rathke, FNP       diphenhydrAMINE (BENADRYL) injection 50 mg  50 mg Intravenous Once Ajibola, Ene A, NP       hydrOXYzine (ATARAX) tablet 25 mg  25 mg Oral TID PRN Lucky Rathke, FNP   25 mg at 03/04/21 0911   magnesium hydroxide (MILK OF MAGNESIA) suspension 30 mL  30 mL Oral Daily PRN Lucky Rathke, FNP       OLANZapine (ZYPREXA) injection 10 mg  10 mg Intramuscular Once PRN Ajibola, Ene A, NP       OLANZapine zydis (ZYPREXA) disintegrating tablet 5 mg  5 mg Oral Once Lucky Rathke, FNP       Or   OLANZapine (ZYPREXA) injection 5 mg  5 mg Intramuscular Once Lucky Rathke, FNP        risperiDONE (RISPERDAL) tablet 3 mg  3 mg Oral QHS Lucky Rathke, FNP   3 mg at 03/03/21 2036   sertraline (ZOLOFT) tablet 150 mg  150 mg Oral Daily Lucky Rathke, FNP   150 mg at 03/04/21 [phone]  traZODone (DESYREL) tablet 50 mg  50 mg Oral QHS PRN Lucky Rathke, FNP       Current Outpatient Medications  Medication Sig Dispense Refill   albuterol (PROVENTIL HFA;VENTOLIN HFA) 108 (90 Base) MCG/ACT inhaler Inhale 1-2 puffs into the lungs every 6 (six) hours as needed for wheezing or shortness of breath.     melatonin 3 MG TABS tablet Take 3 mg by mouth at bedtime.     ondansetron (ZOFRAN) 4 MG tablet Take 1 tablet (4 mg total) by mouth every 6 (six) hours. 12 tablet 0   pantoprazole (PROTONIX) 40 MG tablet Take 40 mg by mouth daily.     risperiDONE (RISPERDAL) 3 MG tablet Take 1 tablet (3 mg total) by mouth at bedtime. 30 tablet 0   sertraline (ZOLOFT) 50 MG tablet Take 3 tablets (150 mg total) by mouth daily.      PTA Medications: (Not in a hospital admission)   Musculoskeletal  Strength & Muscle Tone: within normal limits Gait & Station: normal Patient leans: N/A  Psychiatric Specialty Exam  Presentation  General Appearance: Casual  Eye Contact:Fair  Speech:Clear and Coherent; Normal Rate  Speech Volume:Normal  Handedness:Right   Mood and Affect  Mood:Anxious; Depressed  Affect:Congruent   Thought Process  Thought Processes:Coherent  Descriptions of Associations:Intact  Orientation:Full (Time, Place and Person)  Thought Content:Logical  Diagnosis of Schizophrenia or Schizoaffective disorder in past: No    Hallucinations:Hallucinations: None  Ideas of Reference:Other (comment)  Suicidal Thoughts:Suicidal Thoughts: Yes, Passive SI Passive Intent and/or Plan: With Plan; With Means to [place]; With Intent  Homicidal Thoughts:Homicidal Thoughts: No   Sensorium  Memory:Immediate Good; Recent Good; Remote Good  Judgment:Poor  Insight:Poor   Executive  Functions  Concentration:Good  Attention Span:Good  [place] of Knowledge:Good  Language:Good   Psychomotor Activity  Psychomotor Activity:Psychomotor Activity: Normal   Assets  Assets:Communication Skills; Desire for Improvement; Financial Resources/Insurance; Physical Health; Resilience   Sleep  Sleep:Sleep: Fair   No data recorded  Physical Exam  Physical Exam Vitals and nursing note reviewed.  Constitutional:      General: She is not in acute distress.    Appearance: Normal appearance. She is not ill-appearing.  HENT:     Head: Normocephalic.  Eyes:     General:        Right eye: No discharge.        Left eye: No discharge.     Conjunctiva/sclera: Conjunctivae normal.  Cardiovascular:     Rate and Rhythm: Normal rate.  Pulmonary:     Effort: Pulmonary effort is normal. No respiratory distress.  Musculoskeletal:        General: Normal range of motion.     Cervical back: Normal range of motion.  Skin:    General: Skin is warm and dry.  Neurological:     Mental Status: She is alert and oriented to person, place, and time.  Psychiatric:        Attention and Perception: Attention and perception normal.        Mood and Affect: Mood is anxious and depressed.        Speech: Speech normal.        Behavior: Behavior is cooperative.        Thought Content: Thought content includes suicidal ideation. Thought content includes suicidal plan.        Cognition and Memory: Cognition normal.        Judgment: Judgment is impulsive.   Review of Systems  Constitutional: Negative.   HENT: Negative.    Eyes: Negative.   Respiratory: Negative.    Cardiovascular: Negative.   Musculoskeletal: Negative.   Skin: Negative.   Neurological: Negative.   Psychiatric/Behavioral:  Positive for depression and suicidal ideas. The patient is nervous/anxious.   Blood pressure (!) 107/57, pulse 93, temperature 97.9 F (36.6 C), resp. rate 16, SpO2 99 %. There is no height  or weight on file to calculate BMI.  Demographic Factors:  Caucasian, Low socioeconomic status, Living alone, and Unemployed  Loss Factors: Financial problems/change in socioeconomic status  Historical Factors: Prior suicide attempts and Impulsivity  Risk Reduction Factors:   NA  Continued Clinical Symptoms:  Severe Anxiety and/or Agitation Depression:   Hopelessness Impulsivity More than one psychiatric diagnosis Previous Psychiatric Diagnoses and Treatments  Cognitive Features That Contribute To Risk:  None    Suicide Risk:  Severe:  Frequent, intense, and enduring suicidal ideation, specific plan, no subjective intent, but some objective markers of intent (i.e., choice of lethal method), the method is accessible, some limited preparatory behavior, evidence of impaired self-control, severe dysphoria/symptomatology, multiple risk factors present, and few if any protective factors, particularly a lack of social support.  Plan Of Care/Follow-up recommendations:  Activity:  as tolerated  Diet:  regular  Disposition:   Patient meets inpatient psychiatric admission criteria.   She has been accepted to [facility], she can arrive today.  Patient will be discharged and transferred to [place].   Revonda Humphrey, NP 03/04/2021, 9:28 AM

## 2021-03-04 NOTE — ED Notes (Signed)
Breakfast was giving

## 2021-03-04 NOTE — Progress Notes (Signed)
Arisa took a Multimedia programmer, the sitter remained nearby and she returned to her bed without incident and drifted off to sleep.

## 2021-03-04 NOTE — Discharge Summary (Signed)
[name] to be D/C'd  [facility]   per NP order. EMTALA was printed and to be given to the receiving nurse. Patient escorted out and D/C via sheriff dept. [name]  03/04/2021 10:11 AM

## 2021-03-04 NOTE — ED Notes (Signed)
Pt is currently sleeping, no distress noted, environmental check complete, will continue to monitor patient for safety. ? ?

## 2021-03-04 NOTE — ED Notes (Signed)
Pt took a shower pt stated that taking a shower helps her go to sleep. Pt is on a one on one pt has not had any issues or incidents.

## 2021-03-04 NOTE — Discharge Instructions (Addendum)
Transfer to [place] for inpatient psychiatric admission.

## 2021-03-04 NOTE — ED Notes (Signed)
Pt is currently sleeping with no distress noted.  1:1 sitter at bedside for safety.

## 2021-03-04 NOTE — ED Notes (Addendum)
Report called to Opal Sidles RN at [facility].  Verbalized understanding.

## 2021-03-04 NOTE — ED Notes (Signed)
Pt is currently lying in bed, no distress noted, will continue to monitor patient for safety.

## 2021-03-04 NOTE — ED Notes (Signed)
Pt is  sleeping staff will continue to monitor her to ensure her safety.

## 2021-03-04 NOTE — Progress Notes (Signed)
IVC paperwork faxed to [facility] [phone]. Suzie with intake  [phone] called and said she received the IVC paperwork.

## 2021-03-04 NOTE — ED Notes (Signed)
Pt resting quietly.  Awaiting Sheriff for transport.

## 2021-03-05 LAB — PROLACTIN: Prolactin: 66.2 ng/mL — ABNORMAL HIGH (ref 4.8–23.3)

## 2021-03-10 DIAGNOSIS — R45851 Suicidal ideations: Secondary | ICD-10-CM | POA: Insufficient documentation

## 2021-03-10 DIAGNOSIS — R000 Tachycardia, unspecified: Secondary | ICD-10-CM | POA: Diagnosis not present

## 2021-03-10 DIAGNOSIS — F419 Anxiety disorder, unspecified: Secondary | ICD-10-CM | POA: Insufficient documentation

## 2021-03-10 DIAGNOSIS — Z20822 Contact with and (suspected) exposure to covid-19: Secondary | ICD-10-CM | POA: Insufficient documentation

## 2021-03-10 DIAGNOSIS — F250 Schizoaffective disorder, bipolar type: Secondary | ICD-10-CM | POA: Insufficient documentation

## 2021-03-10 DIAGNOSIS — R519 Headache, unspecified: Secondary | ICD-10-CM | POA: Diagnosis not present

## 2021-03-10 DIAGNOSIS — F840 Autistic disorder: Secondary | ICD-10-CM | POA: Diagnosis not present

## 2021-03-10 DIAGNOSIS — Z79899 Other long term (current) drug therapy: Secondary | ICD-10-CM | POA: Insufficient documentation

## 2021-03-10 DIAGNOSIS — J45909 Unspecified asthma, uncomplicated: Secondary | ICD-10-CM | POA: Diagnosis not present

## 2021-03-10 MED ORDER — ALUM & MAG HYDROXIDE-SIMETH 200-200-20 MG/5ML PO SUSP: 30.0000 mL | ORAL | Status: DC | PRN

## 2021-03-10 MED ORDER — PANTOPRAZOLE SODIUM 40 MG PO TBEC
40.0000 mg | DELAYED_RELEASE_TABLET | Freq: Every day | ORAL | Status: DC
  Administered 2021-03-11: 40 mg via ORAL
  Filled 2021-03-10: qty 1

## 2021-03-10 MED ORDER — ACETAMINOPHEN 325 MG PO TABS: 650.0000 mg | ORAL_TABLET | Freq: Four times a day (QID) | ORAL | Status: DC | PRN

## 2021-03-10 MED ORDER — RISPERIDONE 3 MG PO TABS
3.0000 mg | ORAL_TABLET | Freq: Every day | ORAL | Status: DC
  Administered 2021-03-11: 3 mg via ORAL
  Filled 2021-03-10: qty 1

## 2021-03-10 MED ORDER — MAGNESIUM HYDROXIDE 400 MG/5ML PO SUSP: 30.0000 mL | Freq: Every day | ORAL | Status: DC | PRN

## 2021-03-10 MED ORDER — ONDANSETRON HCL 4 MG PO TABS
4.0000 mg | ORAL_TABLET | Freq: Four times a day (QID) | ORAL | Status: DC
  Administered 2021-03-11: 4 mg via ORAL
  Filled 2021-03-10: qty 1

## 2021-03-10 MED ORDER — SERTRALINE HCL 50 MG PO TABS: 150.0000 mg | ORAL_TABLET | Freq: Every day | ORAL | Status: DC

## 2021-03-10 MED ORDER — ALBUTEROL SULFATE HFA 108 (90 BASE) MCG/ACT IN AERS: 1.0000 | INHALATION_SPRAY | Freq: Four times a day (QID) | RESPIRATORY_TRACT | Status: DC | PRN

## 2021-03-10 MED ORDER — MELATONIN 3 MG PO TABS
3.0000 mg | ORAL_TABLET | Freq: Every day | ORAL | Status: DC
  Administered 2021-03-11: 3 mg via ORAL
  Filled 2021-03-10: qty 1

## 2021-03-10 MED ORDER — LORAZEPAM 1 MG PO TABS
2.0000 mg | ORAL_TABLET | Freq: Once | ORAL | Status: AC
  Administered 2021-03-11: 2 mg via ORAL
  Filled 2021-03-10: qty 2

## 2021-03-11 ENCOUNTER — Ambulatory Visit (INDEPENDENT_AMBULATORY_CARE_PROVIDER_SITE_OTHER)
Admission: EM | Admit: 2021-03-11 | Discharge: 2021-03-11 | Disposition: A | Discharge status: Home / Self Care | Payer: PPO | Source: Home / Self Care

## 2021-03-11 ENCOUNTER — Emergency Department (HOSPITAL_COMMUNITY): Payer: PPO

## 2021-03-11 ENCOUNTER — Encounter (HOSPITAL_COMMUNITY): Payer: Self-pay | Admitting: Registered Nurse

## 2021-03-11 ENCOUNTER — Other Ambulatory Visit: Payer: Self-pay

## 2021-03-11 ENCOUNTER — Emergency Department (HOSPITAL_COMMUNITY)
Admission: EM | Admit: 2021-03-11 | Discharge: 2021-03-12 | Disposition: A | Discharge status: Home / Self Care | Payer: PPO | Attending: Emergency Medicine | Admitting: Emergency Medicine

## 2021-03-11 DIAGNOSIS — F4310 Post-traumatic stress disorder, unspecified: Secondary | ICD-10-CM | POA: Diagnosis present

## 2021-03-11 DIAGNOSIS — F29 Unspecified psychosis not due to a substance or known physiological condition: Secondary | ICD-10-CM | POA: Diagnosis not present

## 2021-03-11 DIAGNOSIS — F332 Major depressive disorder, recurrent severe without psychotic features: Secondary | ICD-10-CM | POA: Diagnosis present

## 2021-03-11 DIAGNOSIS — R45851 Suicidal ideations: Secondary | ICD-10-CM | POA: Diagnosis not present

## 2021-03-11 DIAGNOSIS — J45909 Unspecified asthma, uncomplicated: Secondary | ICD-10-CM | POA: Insufficient documentation

## 2021-03-11 DIAGNOSIS — R42 Dizziness and giddiness: Secondary | ICD-10-CM | POA: Insufficient documentation

## 2021-03-11 DIAGNOSIS — R519 Headache, unspecified: Secondary | ICD-10-CM

## 2021-03-11 DIAGNOSIS — F840 Autistic disorder: Secondary | ICD-10-CM | POA: Insufficient documentation

## 2021-03-11 DIAGNOSIS — F250 Schizoaffective disorder, bipolar type: Secondary | ICD-10-CM | POA: Diagnosis not present

## 2021-03-11 DIAGNOSIS — F603 Borderline personality disorder: Secondary | ICD-10-CM | POA: Diagnosis present

## 2021-03-11 DIAGNOSIS — R000 Tachycardia, unspecified: Secondary | ICD-10-CM | POA: Diagnosis not present

## 2021-03-11 DIAGNOSIS — R200 Anesthesia of skin: Secondary | ICD-10-CM | POA: Insufficient documentation

## 2021-03-11 LAB — CBC WITH DIFFERENTIAL/PLATELET
Abs Immature Granulocytes: 0.03 10*3/uL (ref 0.00–0.07)
Abs Immature Granulocytes: 0.03 10*3/uL (ref 0.00–0.07)
Basophils Absolute: 0 10*3/uL (ref 0.0–0.1)
Basophils Absolute: 0 10*3/uL (ref 0.0–0.1)
Basophils Relative: 1 %
Basophils Relative: 1 %
Eosinophils Absolute: 0.1 10*3/uL (ref 0.0–0.5)
Eosinophils Absolute: 0.1 10*3/uL (ref 0.0–0.5)
Eosinophils Relative: 1 %
Eosinophils Relative: 1 %
HCT: 37.7 % (ref 36.0–46.0)
HCT: 39.1 % (ref 36.0–46.0)
Hemoglobin: 12 g/dL (ref 12.0–15.0)
Hemoglobin: 12.3 g/dL (ref 12.0–15.0)
Immature Granulocytes: 0 %
Immature Granulocytes: 0 %
Lymphocytes Relative: 31 %
Lymphocytes Relative: 34 %
Lymphs Abs: 2.3 10*3/uL (ref 0.7–4.0)
Lymphs Abs: 2.4 10*3/uL (ref 0.7–4.0)
MCH: 26 pg (ref 26.0–34.0)
MCH: 26.1 pg (ref 26.0–34.0)
MCHC: 31.5 g/dL (ref 30.0–36.0)
MCHC: 31.8 g/dL (ref 30.0–36.0)
MCV: 81.6 fL (ref 80.0–100.0)
MCV: 82.8 fL (ref 80.0–100.0)
Monocytes Absolute: 0.5 10*3/uL (ref 0.1–1.0)
Monocytes Absolute: 0.5 10*3/uL (ref 0.1–1.0)
Monocytes Relative: 6 %
Monocytes Relative: 7 %
Neutro Abs: 4 10*3/uL (ref 1.7–7.7)
Neutro Abs: 4.9 10*3/uL (ref 1.7–7.7)
Neutrophils Relative %: 57 %
Neutrophils Relative %: 61 %
Platelets: 229 10*3/uL (ref 150–400)
Platelets: 250 10*3/uL (ref 150–400)
RBC: 4.62 MIL/uL (ref 3.87–5.11)
RBC: 4.72 MIL/uL (ref 3.87–5.11)
RDW: 13.6 % (ref 11.5–15.5)
RDW: 13.7 % (ref 11.5–15.5)
WBC: 6.9 10*3/uL (ref 4.0–10.5)
WBC: 7.9 10*3/uL (ref 4.0–10.5)
nRBC: 0 % (ref 0.0–0.2)
nRBC: 0 % (ref 0.0–0.2)

## 2021-03-11 LAB — POCT URINE DRUG SCREEN - MANUAL ENTRY (I-SCREEN)
POC Amphetamine UR: NOT DETECTED
POC Buprenorphine (BUP): NOT DETECTED
POC Cocaine UR: NOT DETECTED
POC Marijuana UR: NOT DETECTED
POC Methadone UR: NOT DETECTED
POC Methamphetamine UR: NOT DETECTED
POC Morphine: NOT DETECTED
POC Oxazepam (BZO): NOT DETECTED
POC Oxycodone UR: NOT DETECTED
POC Secobarbital (BAR): NOT DETECTED

## 2021-03-11 LAB — POCT PREGNANCY, URINE: Preg Test, Ur: NEGATIVE

## 2021-03-11 LAB — BASIC METABOLIC PANEL
Anion gap: 7 (ref 5–15)
BUN: 8 mg/dL (ref 6–20)
CO2: 23 mmol/L (ref 22–32)
Calcium: 8.9 mg/dL (ref 8.9–10.3)
Chloride: 109 mmol/L (ref 98–111)
Creatinine, Ser: 0.68 mg/dL (ref 0.44–1.00)
GFR, Estimated: >60 mL/min (ref >60)
Glucose, Bld: 113 mg/dL — ABNORMAL HIGH (ref 70–99)
Potassium: 3.5 mmol/L (ref 3.5–5.1)
Sodium: 139 mmol/L (ref 135–145)

## 2021-03-11 LAB — RESP PANEL BY RT-PCR (FLU A&B, COVID) ARPGX2
Influenza A by PCR: NEGATIVE
Influenza B by PCR: NEGATIVE
SARS Coronavirus 2 by RT PCR: NEGATIVE

## 2021-03-11 LAB — POC SARS CORONAVIRUS 2 AG: SARSCOV2ONAVIRUS 2 AG: NEGATIVE

## 2021-03-11 MED ORDER — ONDANSETRON HCL 4 MG PO TABS: 4.0000 mg | ORAL_TABLET | Freq: Four times a day (QID) | ORAL | Status: DC | PRN

## 2021-03-11 MED ORDER — LORAZEPAM 1 MG PO TABS
1.0000 mg | ORAL_TABLET | Freq: Four times a day (QID) | ORAL | Status: DC | PRN
  Administered 2021-03-11: 1 mg via ORAL
  Filled 2021-03-11: qty 1

## 2021-03-11 MED ORDER — RISPERIDONE 2 MG PO TBDP: 2.0000 mg | ORAL_TABLET | Freq: Two times a day (BID) | ORAL | Status: DC | PRN

## 2021-03-11 NOTE — ED Notes (Signed)
Provider made aware that patient is pulling at her skin and banging her head on the wall.  Staff is sitting in assessment room with her

## 2021-03-11 NOTE — ED Provider Notes (Signed)
Behavioral Health Admission H&P [facility] & OBS)  Date: 03/11/21 Patient Name: [name] MRN: [id number] Chief Complaint: No chief complaint on file.     Diagnoses:  Final diagnoses:  Schizoaffective disorder, bipolar type ([place])  Suicidal ideation    HPI: [name] is a 31 y.o. female with a history of schizoaffective disorder-bipolar type, borderline personality disorder, and PTSD who presents voluntarily to [facility] with law enforcement.  Patient is well-known to behavioral health services.  She has a history of multiple inpatient admissions.  She states that she receives CST services through [facility]. Patient states that she was [facility] ago.  Patient was admitted to continuous assessment at [facility] on 03/02/2021 and transferred to [facility] on 03/04/2021.  Patient reports that no changes were made to her medications.  On evaluation, the patient is lying in the floor in the back sally port.  The patient is alert and oriented x4.  She is calm and cooperative.  She is neatly dressed.  Eye contact is fair.  Speech is clear and coherent.  Patient states "I am tired of hearing the voices."  She reports AH of voices telling her to kill herself.  She denies VH.  She does not appear to be responding to internal stimuli.  Patient reports suicidal ideations with thoughts of overdosing on medications.  She has a history of 16 suicide attempts with her most recent attempt in 01/2020.  She states that she is unable to contract for safety if discharged.  She denies homicidal ideations.  She denies substance abuse.   PHQ 2-9:  [place] ED from 02/03/2020 in [facility] ED from 07/26/2019 in [facility] Office Visit from 08/20/2016 in [place] for [facility]  Thoughts that you would be better off dead, or of hurting yourself in some way Nearly every day  [Phreesia 02/03/2020] Nearly every day  Not at all  PHQ-9 Total Score 27 15 1        [place] ED from 03/02/2021 in [facility] ED from 02/25/2021 in [place] DEPT ED from 12/01/2020 in [place] High Risk No Risk High Risk        Total Time spent with patient: 20 minutes  Musculoskeletal  Strength & Muscle Tone: within normal limits Gait & Station: normal Patient leans: N/A  Psychiatric Specialty Exam  Presentation General Appearance: Appropriate for Environment; Neat  Eye Contact:Fair  Speech:Clear and Coherent; Normal Rate  Speech Volume:Normal  Handedness:Right   Mood and Affect  Mood:Anxious; Depressed  Affect:Congruent   Thought Process  Thought Processes:Coherent  Descriptions of Associations:Intact  Orientation:Full (Time, Place and Person)  Thought Content:Logical  Diagnosis of Schizophrenia or Schizoaffective disorder in past: No   Hallucinations:Hallucinations: Auditory Description of Auditory Hallucinations: voice tell her to kill herself  Ideas of Reference:None  Suicidal Thoughts:Suicidal Thoughts: Yes, Active SI Active Intent and/or Plan: With Intent; With Plan; With Means to Home  Homicidal Thoughts:Homicidal Thoughts: No   Sensorium  Memory:Immediate Good; Recent Good  Judgment:Poor  Insight:Poor   Executive Functions  Concentration:Good  Attention Span:Good  [place] of Knowledge:Good  Language:Good   Psychomotor Activity  Psychomotor Activity:Psychomotor Activity: Normal   Assets  Assets:Communication Skills; Desire for Improvement; Financial Resources/Insurance; Physical Health; Social Support   Sleep  Sleep:Sleep: Fair   Nutritional Assessment (For OBS and [facility] admissions only) Has the patient had  a weight loss or gain of 10 pounds or more in the last 3 months?: No Has the patient had a decrease in food intake/or  appetite?: No Does the patient have dental problems?: No Does the patient have eating habits or behaviors that may be indicators of an eating disorder including binging or inducing vomiting?: No Has the patient recently lost weight without trying?: 0 Has the patient been eating poorly because of a decreased appetite?: 0 Malnutrition Screening Tool Score: 0    Physical Exam Constitutional:      General: She is not in acute distress.    Appearance: She is not ill-appearing, toxic-appearing or diaphoretic.  HENT:     Head: Normocephalic.     Right Ear: External ear normal.     Left Ear: External ear normal.  Eyes:     Conjunctiva/sclera: Conjunctivae normal.     Pupils: Pupils are equal, round, and reactive to light.  Cardiovascular:     Rate and Rhythm: Normal rate.  Pulmonary:     Effort: Pulmonary effort is normal. No respiratory distress.  Musculoskeletal:        General: Normal range of motion.  Skin:    General: Skin is warm and dry.  Neurological:     Mental Status: She is alert and oriented to person, place, and time.  Psychiatric:        Mood and Affect: Mood is anxious and depressed.        Thought Content: Thought content is not paranoid or delusional. Thought content includes suicidal ideation. Thought content does not include homicidal ideation. Thought content includes suicidal plan.   Review of Systems  Constitutional:  Negative for chills, diaphoresis, fever, malaise/fatigue and weight loss.  HENT:  Negative for congestion.   Respiratory:  Negative for cough and shortness of breath.   Cardiovascular:  Negative for chest pain and palpitations.  Gastrointestinal:  Negative for diarrhea, nausea and vomiting.  Neurological:  Negative for dizziness and seizures.  Psychiatric/Behavioral:  Positive for depression, hallucinations and suicidal ideas. Negative for memory loss. The patient is nervous/anxious and has insomnia.   All other systems reviewed and are  negative.  Blood pressure 124/70, pulse (!) 118, resp. rate 16, SpO2 98 %. There is no height or weight on file to calculate BMI.  Past Psychiatric History: see above  Is the patient at risk to self? Yes  Has the patient been a risk to self in the past 6 months? Yes .    Has the patient been a risk to self within the distant past? Yes   Is the patient a risk to others? No   Has the patient been a risk to others in the past 6 months? No   Has the patient been a risk to others within the distant past? 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    Chronic constipation    Depression    Drug-seeking behavior    Essential tremor    Headache    Overdose of acetaminophen 07/2017   and other meds   Personality disorder ([place])    Schizo-affective psychosis ([place])    Schizoaffective disorder, bipolar type ([place])    Seizures ([place])    Last seizure December 2017   Sleep apnea     Past  Surgical History:  Procedure Laterality Date   MOUTH SURGERY  2009 or 2010    Family History:  Family History  Problem Relation Age of Onset   Mental illness Father    Asthma Father    PDD Brother    Seizures Brother     Social History:  Social History   Socioeconomic History   Marital status: Widowed    Spouse name: Not on file   Number of children: 0   Years of education: Not on file   Highest education level: Not on file  Occupational History   Occupation: disability  Tobacco Use   Smoking status: Former    Packs/day: 0.00    Types: Cigarettes   Smokeless tobacco: Never   Tobacco comments:    Smoked for 2  years age [age]  Vaping Use   Vaping Use: Never used  Substance and Sexual Activity   Alcohol use: No    Alcohol/week: 1.0 standard drink    Types: 1 Standard drinks or equivalent per week    Comment: denies at this time   Drug use: No     Comment: History of cocaine use at age 62 for 4 months   Sexual activity: Yes    Birth control/protection: None  Other Topics Concern   Not on file  Social History Narrative   Marital status: Widowed      Children: daughter      Lives: with boyfriend, in two story home      Employment:  Disability      Tobacco: quit smoking; smoked for two years.      Alcohol ;none      Drugs: none   Has not traveled outside of the country.   Right handed         Social Determinants of Health   Financial Resource Strain: Not on file  Food Insecurity: Not on file  Transportation Needs: Not on file  Physical Activity: Not on file  Stress: Not on file  Social Connections: Not on file  Intimate Partner Violence: Not on file    SDOH:  SDOH Screenings   Alcohol Screen: Not on file  Depression (PHQ2-9): Not on file  Financial Resource Strain: Not on file  Food Insecurity: Not on file  Housing: Not on file  Physical Activity: Not on file  Social Connections: Not on file  Stress: Not on file  Tobacco Use: Medium Risk   Smoking Tobacco Use: Former   Smokeless Tobacco Use: Never   Passive Exposure: Not on file  Transportation Needs: Not on file    Last Labs:  Admission on 03/02/2021, Discharged on 03/04/2021  Component Date Value Ref Range Status   SARS Coronavirus 2 by RT PCR 03/02/2021 NEGATIVE  NEGATIVE Final   Comment: (NOTE) SARS-CoV-2 target nucleic acids are NOT DETECTED.  The SARS-CoV-2 RNA is generally detectable in upper respiratory specimens during the acute phase of infection. The lowest concentration of SARS-CoV-2 viral copies this assay can detect is 138 copies/mL. A negative result does not preclude SARS-Cov-2 infection and should not be used as the sole basis for treatment or other patient management decisions. A negative result may occur with  improper specimen collection/handling, submission of specimen other than nasopharyngeal swab, presence of viral mutation(s)  within the areas targeted by this assay, and inadequate number of viral copies(<138 copies/mL). A negative result must be combined with clinical observations, patient history, and epidemiological information. The expected result is Negative.  Fact Sheet for Patients:  EntrepreneurPulse.com.au  Fact Sheet for Healthcare Providers:  IncredibleEmployment.be  This test is no                          t yet approved or cleared by the [place] FDA and  has been authorized for detection and/or diagnosis of SARS-CoV-2 by FDA under an Emergency Use Authorization (EUA). This EUA will remain  in effect (meaning this test can be used) for the duration of the COVID-19 declaration under Section 564(b)(1) of the Act, 21 U.S.C.section 360bbb-3(b)(1), unless the authorization is terminated  or revoked sooner.       Influenza A by PCR 03/02/2021 NEGATIVE  NEGATIVE Final   Influenza B by PCR 03/02/2021 NEGATIVE  NEGATIVE Final   Comment: (NOTE) The Xpert Xpress SARS-CoV-2/FLU/RSV plus assay is intended as an aid in the diagnosis of influenza from Nasopharyngeal swab specimens and should not be used as a sole basis for treatment. Nasal washings and aspirates are unacceptable for Xpert Xpress SARS-CoV-2/FLU/RSV testing.  Fact Sheet for Patients: EntrepreneurPulse.com.au  Fact Sheet for Healthcare Providers: IncredibleEmployment.be  This test is not yet approved or cleared by the [place] FDA and has been authorized for detection and/or diagnosis of SARS-CoV-2 by FDA under an Emergency Use Authorization (EUA). This EUA will remain in effect (meaning this test can be used) for the duration of the COVID-19 declaration under Section 564(b)(1) of the Act, 21 U.S.C. section 360bbb-3(b)(1), unless the authorization is terminated or revoked.  Performed at [facility], [place] [address], [place], [place] [address]     WBC 03/02/2021 7.6  4.0 - 10.5 K/uL Final   RBC 03/02/2021 4.99  3.87 - 5.11 MIL/uL Final   Hemoglobin 03/02/2021 13.0  12.0 - 15.0 g/dL Final   HCT 03/02/2021 40.3  36.0 - 46.0 % Final   MCV 03/02/2021 80.8  80.0 - 100.0 fL Final   MCH 03/02/2021 26.1  26.0 - 34.0 pg Final   MCHC 03/02/2021 32.3  30.0 - 36.0 g/dL Final   RDW 03/02/2021 13.4  11.5 - 15.5 % Final   Platelets 03/02/2021 272  150 - 400 K/uL Final   nRBC 03/02/2021 0.0  0.0 - 0.2 % Final   Neutrophils Relative % 03/02/2021 65  % Final   Neutro Abs 03/02/2021 4.9  1.7 - 7.7 K/uL Final   Lymphocytes Relative 03/02/2021 30  % Final   Lymphs Abs 03/02/2021 2.2  0.7 - 4.0 K/uL Final   Monocytes Relative 03/02/2021 5  % Final   Monocytes Absolute 03/02/2021 0.4  0.1 - 1.0 K/uL Final   Eosinophils Relative 03/02/2021 0  % Final   Eosinophils Absolute 03/02/2021 0.0  0.0 - 0.5 K/uL Final   Basophils Relative 03/02/2021 0  % Final   Basophils Absolute 03/02/2021 0.0  0.0 - 0.1 K/uL Final   Immature Granulocytes 03/02/2021 0  % Final   Abs Immature Granulocytes 03/02/2021 0.03  0.00 - 0.07 K/uL Final   Performed at [facility], [place] [address] [place], [place] [address]   Sodium 03/02/2021 137  135 - 145 mmol/L Final   Potassium 03/02/2021 3.9  3.5 - 5.1 mmol/L Final   Chloride 03/02/2021 106  98 - 111 mmol/L Final   CO2 03/02/2021 20 (L)  22 - 32 mmol/L Final   Glucose, Bld 03/02/2021 89  70 - 99 mg/dL Final   Glucose reference range applies only to samples taken after fasting for at least  8 hours.   BUN 03/02/2021 5 (L)  6 - 20 mg/dL Final   Creatinine, Ser 03/02/2021 0.69  0.44 - 1.00 mg/dL Final   Calcium 03/02/2021 9.5  8.9 - 10.3 mg/dL Final   Total Protein 03/02/2021 7.3  6.5 - 8.1 g/dL Final   Albumin 03/02/2021 4.3  3.5 - 5.0 g/dL Final   AST 03/02/2021 16  15 - 41 U/L Final   ALT 03/02/2021 16  0 - 44 U/L Final   Alkaline Phosphatase 03/02/2021 76  38 - 126 U/L Final   Total Bilirubin 03/02/2021 0.5  0.3 -  1.2 mg/dL Final   GFR, Estimated 03/02/2021 >60  >60 mL/min Final   Comment: (NOTE) Calculated using the CKD-EPI Creatinine Equation (2021)    Anion gap 03/02/2021 11  5 - 15 Final   Performed at [facility] [address], [place], [place] [address]   Hgb A1c MFr Bld 03/02/2021 5.1  4.8 - 5.6 % Final   Comment: (NOTE) Pre diabetes:          5.7%-6.4%  Diabetes:              >6.4%  Glycemic control for   <7.0% adults with diabetes    Mean Plasma Glucose 03/02/2021 99.67  mg/dL Final   Performed at [facility], [place] [address], [place], [place] [address]   Magnesium 03/02/2021 2.1  1.7 - 2.4 mg/dL Final   Performed at [facility] [address] [place], [place] [address]   Alcohol, Ethyl (B) 03/02/2021 <10  <10 mg/dL Final   Comment: (NOTE) Lowest detectable limit for serum alcohol is 10 mg/dL.  For medical purposes only. Performed at [facility], [place] [address], [place], [place] [address]    TSH 03/02/2021 2.216  0.350 - 4.500 uIU/mL Final   Comment: Performed by a 3rd Generation assay with a functional sensitivity of <=0.01 uIU/mL. Performed at [facility], [place] [address] [place], [place] [address]    Prolactin 03/02/2021 66.2 (H)  4.8 - 23.3 ng/mL Final   Comment: (NOTE) Performed At: [facility] [place], [place] [id number] Rush Farmer MD PI:[id number]    POC Amphetamine UR 03/02/2021 None Detected  NONE DETECTED (Cut Off Level 1000 ng/mL) Final   POC Secobarbital (BAR) 03/02/2021 None Detected  NONE DETECTED (Cut Off Level 300 ng/mL) Final   POC Buprenorphine (BUP) 03/02/2021 None Detected  NONE DETECTED (Cut Off Level 10 ng/mL) Final   POC Oxazepam (BZO) 03/02/2021 None Detected  NONE DETECTED (Cut Off Level 300 ng/mL) Final   POC Cocaine UR 03/02/2021 None Detected  NONE DETECTED (Cut Off Level 300 ng/mL) Final   POC Methamphetamine UR 03/02/2021 None Detected  NONE DETECTED (Cut Off Level 1000 ng/mL)  Final   POC Morphine 03/02/2021 None Detected  NONE DETECTED (Cut Off Level 300 ng/mL) Final   POC Oxycodone UR 03/02/2021 None Detected  NONE DETECTED (Cut Off Level 100 ng/mL) Final   POC Methadone UR 03/02/2021 None Detected  NONE DETECTED (Cut Off Level 300 ng/mL) Final   POC Marijuana UR 03/02/2021 None Detected  NONE DETECTED (Cut Off Level 50 ng/mL) Final   SARS Coronavirus 2 Ag 03/02/2021 Negative  Negative Preliminary   SARSCOV2ONAVIRUS 2 AG 03/02/2021 NEGATIVE  NEGATIVE Final   Comment: (NOTE) SARS-CoV-2 antigen NOT DETECTED.   Negative results are presumptive.  Negative results do not preclude SARS-CoV-2 infection and should not be used as the sole  basis for treatment or other patient management decisions, including infection  control decisions, particularly in the presence of clinical signs and  symptoms consistent with COVID-19, or in those who have been in contact with the virus.  Negative results must be combined with clinical observations, patient history, and epidemiological information. The expected result is Negative.  Fact Sheet for Patients: HandmadeRecipes.com.cy  Fact Sheet for Healthcare Providers: FuneralLife.at  This test is not yet approved or cleared by the [place] FDA and  has been authorized for detection and/or diagnosis of SARS-CoV-2 by FDA under an Emergency Use Authorization (EUA).  This EUA will remain in effect (meaning this test can be used) for the duration of  the COV                          ID-19 declaration under Section 564(b)(1) of the Act, 21 U.S.C. section 360bbb-3(b)(1), unless the authorization is terminated or revoked sooner.     Preg Test, Ur 03/02/2021 NEGATIVE  NEGATIVE Final   Comment:        THE SENSITIVITY OF THIS METHODOLOGY IS >24 mIU/mL    Cholesterol 03/02/2021 227 (H)  0 - 200 mg/dL Final   Triglycerides 03/02/2021 147  <150 mg/dL Final   HDL 03/02/2021 45  >40  mg/dL Final   Total CHOL/HDL Ratio 03/02/2021 5.0  RATIO Final   VLDL 03/02/2021 29  0 - 40 mg/dL Final   LDL Cholesterol 03/02/2021 153 (H)  0 - 99 mg/dL Final   Comment:        Total Cholesterol/HDL:CHD Risk Coronary Heart Disease Risk Table                     Men   Women  1/2 Average Risk   3.4   3.3  Average Risk       5.0   4.4  2 X Average Risk   9.6   7.1  3 X Average Risk  23.4   11.0        Use the calculated Patient Ratio above and the CHD Risk Table to determine the patient's CHD Risk.        ATP III CLASSIFICATION (LDL):  <100     mg/dL   Optimal  100-129  mg/dL   Near or Above                    Optimal  130-159  mg/dL   Borderline  160-189  mg/dL   High  >190     mg/dL   Very High Performed at [facility] [address], [place], [place] [address]    Color, Urine 03/03/2021 STRAW (A)  YELLOW Final   APPearance 03/03/2021 CLEAR  CLEAR Final   Specific Gravity, Urine 03/03/2021 1.012  1.005 - 1.030 Final   pH 03/03/2021 6.0  5.0 - 8.0 Final   Glucose, UA 03/03/2021 NEGATIVE  NEGATIVE mg/dL Final   Hgb urine dipstick 03/03/2021 MODERATE (A)  NEGATIVE Final   Bilirubin Urine 03/03/2021 NEGATIVE  NEGATIVE Final   Ketones, ur 03/03/2021 NEGATIVE  NEGATIVE mg/dL Final   Protein, ur 03/03/2021 NEGATIVE  NEGATIVE mg/dL Final   Nitrite 03/03/2021 NEGATIVE  NEGATIVE Final   Leukocytes,Ua 03/03/2021 NEGATIVE  NEGATIVE Final   RBC / HPF 03/03/2021 0-5  0 - 5 RBC/hpf Final   WBC, UA 03/03/2021 0-5  0 - 5 WBC/hpf Final   Bacteria, UA 03/03/2021 NONE SEEN  NONE SEEN Final   Squamous Epithelial / LPF 03/03/2021 0-5  0 - 5 Final   Mucus 03/03/2021 PRESENT   Final   Performed at [facility], [place] [address], [place], [place] [address]  Admission on 02/25/2021, Discharged on 02/25/2021  Component Date Value Ref Range Status   Sodium 02/25/2021 137  135 - 145 mmol/L Final   Potassium 02/25/2021 3.9  3.5 - 5.1 mmol/L Final   Chloride 02/25/2021 108  98 - 111  mmol/L Final   CO2 02/25/2021 22  22 - 32 mmol/L Final   Glucose, Bld 02/25/2021 114 (H)  70 - 99 mg/dL Final   Glucose reference range applies only to samples taken after fasting for at least 8 hours.   BUN 02/25/2021 8  6 - 20 mg/dL Final   Creatinine, Ser 02/25/2021 0.63  0.44 - 1.00 mg/dL Final   Calcium 02/25/2021 9.0  8.9 - 10.3 mg/dL Final   Total Protein 02/25/2021 7.5  6.5 - 8.1 g/dL Final   Albumin 02/25/2021 4.2  3.5 - 5.0 g/dL Final   AST 02/25/2021 19  15 - 41 U/L Final   ALT 02/25/2021 17  0 - 44 U/L Final   Alkaline Phosphatase 02/25/2021 68  38 - 126 U/L Final   Total Bilirubin 02/25/2021 0.4  0.3 - 1.2 mg/dL Final   GFR, Estimated 02/25/2021 >60  >60 mL/min Final   Comment: (NOTE) Calculated using the CKD-EPI Creatinine Equation (2021)    Anion gap 02/25/2021 7  5 - 15 Final   Performed at [facility], [place] [address] [place], [place] [address]   WBC 02/25/2021 9.2  4.0 - 10.5 K/uL Final   RBC 02/25/2021 4.77  3.87 - 5.11 MIL/uL Final   Hemoglobin 02/25/2021 12.4  12.0 - 15.0 g/dL Final   HCT 02/25/2021 39.1  36.0 - 46.0 % Final   MCV 02/25/2021 82.0  80.0 - 100.0 fL Final   MCH 02/25/2021 26.0  26.0 - 34.0 pg Final   MCHC 02/25/2021 31.7  30.0 - 36.0 g/dL Final   RDW 02/25/2021 13.7  11.5 - 15.5 % Final   Platelets 02/25/2021 253  150 - 400 K/uL Final   nRBC 02/25/2021 0.0  0.0 - 0.2 % Final   Neutrophils Relative % 02/25/2021 63  % Final   Neutro Abs 02/25/2021 5.8  1.7 - 7.7 K/uL Final   Lymphocytes Relative 02/25/2021 29  % Final   Lymphs Abs 02/25/2021 2.7  0.7 - 4.0 K/uL Final   Monocytes Relative 02/25/2021 7  % Final   Monocytes Absolute 02/25/2021 0.6  0.1 - 1.0 K/uL Final   Eosinophils Relative 02/25/2021 1  % Final   Eosinophils Absolute 02/25/2021 0.1  0.0 - 0.5 K/uL Final   Basophils Relative 02/25/2021 0  % Final   Basophils Absolute 02/25/2021 0.0  0.0 - 0.1 K/uL Final   Immature Granulocytes 02/25/2021 0  % Final   Abs  Immature Granulocytes 02/25/2021 0.03  0.00 - 0.07 K/uL Final   Performed at [facility], [place] [address], [place], [place] [address]   Lipase 02/25/2021 35  11 - 51 U/L Final   Performed at [facility], [place] [address], [place], [place] [address]   Preg Test, Ur 02/25/2021 NEGATIVE  NEGATIVE Final   Comment:        THE SENSITIVITY OF THIS METHODOLOGY IS >20 mIU/mL. Performed at [facility], [place] [address], [place],  [address]  Admission on 12/01/2020, Discharged on 12/03/2020  Component Date Value Ref Range Status   SARS Coronavirus 2 by RT PCR 12/01/2020 NEGATIVE  NEGATIVE Final   Comment: (NOTE) SARS-CoV-2 target nucleic acids are NOT DETECTED.  The SARS-CoV-2 RNA is generally detectable in upper respiratory specimens during the acute phase of infection. The lowest concentration of SARS-CoV-2 viral copies this assay can detect is 138 copies/mL. A negative result does not preclude SARS-Cov-2 infection and should not be used as the sole basis for treatment or other patient management decisions. A negative result may occur with  improper specimen collection/handling, submission of specimen other than nasopharyngeal swab, presence of viral mutation(s) within the areas targeted by this assay, and inadequate number of viral copies(<138 copies/mL). A negative result must be combined with clinical observations, patient history, and epidemiological information. The expected result is Negative.  Fact Sheet for Patients:  EntrepreneurPulse.com.au  Fact Sheet for Healthcare Providers:  IncredibleEmployment.be  This test is no                          t yet approved or cleared by the [place] FDA and  has been authorized for detection and/or diagnosis of SARS-CoV-2 by FDA under an Emergency Use Authorization (EUA). This EUA will remain  in effect (meaning this test can be used) for  the duration of the COVID-19 declaration under Section 564(b)(1) of the Act, 21 U.S.C.section 360bbb-3(b)(1), unless the authorization is terminated  or revoked sooner.       Influenza A by PCR 12/01/2020 NEGATIVE  NEGATIVE Final   Influenza B by PCR 12/01/2020 NEGATIVE  NEGATIVE Final   Comment: (NOTE) The Xpert Xpress SARS-CoV-2/FLU/RSV plus assay is intended as an aid in the diagnosis of influenza from Nasopharyngeal swab specimens and should not be used as a sole basis for treatment. Nasal washings and aspirates are unacceptable for Xpert Xpress SARS-CoV-2/FLU/RSV testing.  Fact Sheet for Patients: EntrepreneurPulse.com.au  Fact Sheet for Healthcare Providers: IncredibleEmployment.be  This test is not yet approved or cleared by the [place] FDA and has been authorized for detection and/or diagnosis of SARS-CoV-2 by FDA under an Emergency Use Authorization (EUA). This EUA will remain in effect (meaning this test can be used) for the duration of the COVID-19 declaration under Section 564(b)(1) of the Act, 21 U.S.C. section 360bbb-3(b)(1), unless the authorization is terminated or revoked.  Performed at [facility], [place] [address], [place], [place] [address]    Preg Test, Ur 12/01/2020 NEGATIVE  NEGATIVE Final   Comment:        THE SENSITIVITY OF THIS METHODOLOGY IS >20 mIU/mL. Performed at [facility], [place] [address], [place], [place] [address]    POC Amphetamine UR 12/01/2020 None Detected  NONE DETECTED (Cut Off Level 1000 ng/mL) Final   POC Secobarbital (BAR) 12/01/2020 None Detected  NONE DETECTED (Cut Off Level 300 ng/mL) Final   POC Buprenorphine (BUP) 12/01/2020 None Detected  NONE DETECTED (Cut Off Level 10 ng/mL) Final   POC Oxazepam (BZO) 12/01/2020 None Detected  NONE DETECTED (Cut Off Level 300 ng/mL) Final   POC Cocaine UR 12/01/2020 None Detected  NONE DETECTED (Cut Off Level 300 ng/mL) Final   POC  Methamphetamine UR 12/01/2020 None Detected  NONE DETECTED (Cut Off Level 1000 ng/mL) Final   POC Morphine 12/01/2020 None Detected  NONE DETECTED (Cut Off Level 300 ng/mL) Final   POC Oxycodone UR 12/01/2020 None Detected  NONE DETECTED (Cut  Off Level 100 ng/mL) Final   POC Methadone UR 12/01/2020 None Detected  NONE DETECTED (Cut Off Level 300 ng/mL) Final   POC Marijuana UR 12/01/2020 None Detected  NONE DETECTED (Cut Off Level 50 ng/mL) Final   Color, Urine 12/01/2020 YELLOW  YELLOW Final   APPearance 12/01/2020 HAZY (A)  CLEAR Final   Specific Gravity, Urine 12/01/2020 1.015  1.005 - 1.030 Final   pH 12/01/2020 5.0  5.0 - 8.0 Final   Glucose, UA 12/01/2020 NEGATIVE  NEGATIVE mg/dL Final   Hgb urine dipstick 12/01/2020 NEGATIVE  NEGATIVE Final   Bilirubin Urine 12/01/2020 NEGATIVE  NEGATIVE Final   Ketones, ur 12/01/2020 NEGATIVE  NEGATIVE mg/dL Final   Protein, ur 12/01/2020 NEGATIVE  NEGATIVE mg/dL Final   Nitrite 12/01/2020 NEGATIVE  NEGATIVE Final   Leukocytes,Ua 12/01/2020 TRACE (A)  NEGATIVE Final   RBC / HPF 12/01/2020 0-5  0 - 5 RBC/hpf Final   WBC, UA 12/01/2020 0-5  0 - 5 WBC/hpf Final   Bacteria, UA 12/01/2020 RARE (A)  NONE SEEN Final   Squamous Epithelial / LPF 12/01/2020 0-5  0 - 5 Final   Mucus 12/01/2020 PRESENT   Final   Performed at [facility], [place] [address] [place], [place] [address]   SARSCOV2ONAVIRUS 2 AG 12/01/2020 NEGATIVE  NEGATIVE Final   Comment: (NOTE) SARS-CoV-2 antigen NOT DETECTED.   Negative results are presumptive.  Negative results do not preclude SARS-CoV-2 infection and should not be used as the sole basis for treatment or other patient management decisions, including infection  control decisions, particularly in the presence of clinical signs and  symptoms consistent with COVID-19, or in those who have been in contact with the virus.  Negative results must be combined with clinical observations, patient history, and  epidemiological information. The expected result is Negative.  Fact Sheet for Patients: HandmadeRecipes.com.cy  Fact Sheet for Healthcare Providers: FuneralLife.at  This test is not yet approved or cleared by the [place] FDA and  has been authorized for detection and/or diagnosis of SARS-CoV-2 by FDA under an Emergency Use Authorization (EUA).  This EUA will remain in effect (meaning this test can be used) for the duration of  the COV                          ID-19 declaration under Section 564(b)(1) of the Act, 21 U.S.C. section 360bbb-3(b)(1), unless the authorization is terminated or revoked sooner.     Preg Test, Ur 12/01/2020 NEGATIVE  NEGATIVE Final   Comment:        THE SENSITIVITY OF THIS METHODOLOGY IS >24 mIU/mL   Admission on 11/21/2020, Discharged on 11/22/2020  Component Date Value Ref Range Status   SARS Coronavirus 2 by RT PCR 11/21/2020 NEGATIVE  NEGATIVE Final   Comment: (NOTE) SARS-CoV-2 target nucleic acids are NOT DETECTED.  The SARS-CoV-2 RNA is generally detectable in upper respiratory specimens during the acute phase of infection. The lowest concentration of SARS-CoV-2 viral copies this assay can detect is 138 copies/mL. A negative result does not preclude SARS-Cov-2 infection and should not be used as the sole basis for treatment or other patient management decisions. A negative result may occur with  improper specimen collection/handling, submission of specimen other than nasopharyngeal swab, presence of viral mutation(s) within the areas targeted by this assay, and inadequate number of viral copies(<138 copies/mL). A negative result must be combined with clinical observations, patient history, and epidemiological information. The expected  result is Negative.  Fact Sheet for Patients:  EntrepreneurPulse.com.au  Fact Sheet for Healthcare Providers:   IncredibleEmployment.be  This test is no                          t yet approved or cleared by the [place] FDA and  has been authorized for detection and/or diagnosis of SARS-CoV-2 by FDA under an Emergency Use Authorization (EUA). This EUA will remain  in effect (meaning this test can be used) for the duration of the COVID-19 declaration under Section 564(b)(1) of the Act, 21 U.S.C.section 360bbb-3(b)(1), unless the authorization is terminated  or revoked sooner.       Influenza A by PCR 11/21/2020 NEGATIVE  NEGATIVE Final   Influenza B by PCR 11/21/2020 NEGATIVE  NEGATIVE Final   Comment: (NOTE) The Xpert Xpress SARS-CoV-2/FLU/RSV plus assay is intended as an aid in the diagnosis of influenza from Nasopharyngeal swab specimens and should not be used as a sole basis for treatment. Nasal washings and aspirates are unacceptable for Xpert Xpress SARS-CoV-2/FLU/RSV testing.  Fact Sheet for Patients: EntrepreneurPulse.com.au  Fact Sheet for Healthcare Providers: IncredibleEmployment.be  This test is not yet approved or cleared by the [place] FDA and has been authorized for detection and/or diagnosis of SARS-CoV-2 by FDA under an Emergency Use Authorization (EUA). This EUA will remain in effect (meaning this test can be used) for the duration of the COVID-19 declaration under Section 564(b)(1) of the Act, 21 U.S.C. section 360bbb-3(b)(1), unless the authorization is terminated or revoked.  Performed at [facility], [place] [address], [place], [place] [address]    I-stat hCG, quantitative 11/21/2020 <5.0  <5 mIU/mL Final   Comment 3 11/21/2020          Final   Comment:   GEST. AGE      CONC.  (mIU/mL)   <=1 WEEK        5 - 50     2 WEEKS       50 - 500     3 WEEKS       100 - 10,000     4 WEEKS     1,000 - 30,000        FEMALE AND NON-PREGNANT FEMALE:     LESS THAN 5 mIU/mL     Glucose-Capillary 11/21/2020 90  70 - 99 mg/dL Final   Glucose reference range applies only to samples taken after fasting for at least 8 hours.   Sodium 11/21/2020 136  135 - 145 mmol/L Final   Potassium 11/21/2020 4.2  3.5 - 5.1 mmol/L Final   Chloride 11/21/2020 106  98 - 111 mmol/L Final   CO2 11/21/2020 20 (L)  22 - 32 mmol/L Final   Glucose, Bld 11/21/2020 95  70 - 99 mg/dL Final   Glucose reference range applies only to samples taken after fasting for at least 8 hours.   BUN 11/21/2020 10  6 - 20 mg/dL Final   Creatinine, Ser 11/21/2020 0.68  0.44 - 1.00 mg/dL Final   Calcium 11/21/2020 9.1  8.9 - 10.3 mg/dL Final   Total Protein 11/21/2020 7.7  6.5 - 8.1 g/dL Final   Albumin 11/21/2020 4.0  3.5 - 5.0 g/dL Final   AST 11/21/2020 28  15 - 41 U/L Final   ALT 11/21/2020 20  0 - 44 U/L Final   Alkaline Phosphatase 11/21/2020 63  38 - 126 U/L Final   Total Bilirubin  11/21/2020 0.6  0.3 - 1.2 mg/dL Final   GFR, Estimated 11/21/2020 >60  >60 mL/min Final   Comment: (NOTE) Calculated using the CKD-EPI Creatinine Equation (2021)    Anion gap 11/21/2020 10  5 - 15 Final   Performed at [facility], [place] [address] [place], [place] [address]   Salicylate Lvl 19/14/7829 <7.0 (L)  7.0 - 30.0 mg/dL Final   Performed at [place] [address], [place], [place] [address]   Acetaminophen (Tylenol), Serum 11/21/2020 <10 (L)  10 - 30 ug/mL Final   Comment: (NOTE) Therapeutic concentrations vary significantly. A range of 10-30 ug/mL  may be an effective concentration for many patients. However, some  are best treated at concentrations outside of this range. Acetaminophen concentrations >150 ug/mL at 4 hours after ingestion  and >50 ug/mL at 12 hours after ingestion are often associated with  toxic reactions.  Performed at [facility], [place] [address] [place], [place] [address]    Alcohol, Ethyl (B) 11/21/2020 <10  <10 mg/dL Final    Comment: (NOTE) Lowest detectable limit for serum alcohol is 10 mg/dL.  For medical purposes only. Performed at [facility], [place] [address] [place], [place] [address]    Opiates 11/21/2020 NONE DETECTED  NONE DETECTED Final   Cocaine 11/21/2020 NONE DETECTED  NONE DETECTED Final   Benzodiazepines 11/21/2020 NONE DETECTED  NONE DETECTED Final   Amphetamines 11/21/2020 NONE DETECTED  NONE DETECTED Final   Tetrahydrocannabinol 11/21/2020 NONE DETECTED  NONE DETECTED Final   Barbiturates 11/21/2020 NONE DETECTED  NONE DETECTED Final   Comment: (NOTE) DRUG SCREEN FOR MEDICAL PURPOSES ONLY.  IF CONFIRMATION IS NEEDED FOR ANY PURPOSE, NOTIFY LAB WITHIN 5 DAYS.  LOWEST DETECTABLE LIMITS FOR URINE DRUG SCREEN Drug Class                     Cutoff (ng/mL) Amphetamine and metabolites    1000 Barbiturate and metabolites    200 Benzodiazepine                 295 Tricyclics and metabolites     300 Opiates and metabolites        300 Cocaine and metabolites        300 THC                            50 Performed at [facility], [place] [address], [place], [place] [address]    WBC 11/21/2020 9.8  4.0 - 10.5 K/uL Final   RBC 11/21/2020 4.58  3.87 - 5.11 MIL/uL Final   Hemoglobin 11/21/2020 12.0  12.0 - 15.0 g/dL Final   HCT 11/21/2020 38.0  36.0 - 46.0 % Final   MCV 11/21/2020 83.0  80.0 - 100.0 fL Final   MCH 11/21/2020 26.2  26.0 - 34.0 pg Final   MCHC 11/21/2020 31.6  30.0 - 36.0 g/dL Final   RDW 11/21/2020 14.1  11.5 - 15.5 % Final   Platelets 11/21/2020 231  150 - 400 K/uL Final   nRBC 11/21/2020 0.0  0.0 - 0.2 % Final   Neutrophils Relative % 11/21/2020 72  % Final   Neutro Abs 11/21/2020 7.0  1.7 - 7.7 K/uL Final   Lymphocytes Relative 11/21/2020 21  % Final   Lymphs Abs 11/21/2020 2.1  0.7 - 4.0 K/uL Final   Monocytes Relative 11/21/2020 6  % Final  Monocytes Absolute 11/21/2020 0.6  0.1 - 1.0 K/uL Final   Eosinophils Relative 11/21/2020  0  % Final   Eosinophils Absolute 11/21/2020 0.0  0.0 - 0.5 K/uL Final   Basophils Relative 11/21/2020 0  % Final   Basophils Absolute 11/21/2020 0.0  0.0 - 0.1 K/uL Final   Immature Granulocytes 11/21/2020 1  % Final   Abs Immature Granulocytes 11/21/2020 0.05  0.00 - 0.07 K/uL Final   Performed at [facility], [place] [address], [place], [place] [address]    Allergies: Bee venom, Coconut flavor, Geodon [ziprasidone hcl], Haloperidol and related, Lithium, Oxycodone, Quetiapine, Seroquel [quetiapine fumarate], Shellfish allergy, Phenergan [promethazine hcl], Prilosec [omeprazole], Sulfa antibiotics, Tegretol [carbamazepine], Prozac [fluoxetine], and Tape  PTA Medications:  Prior to Admission medications   Medication Sig Start Date End Date Taking? Authorizing Provider  albuterol (PROVENTIL HFA;VENTOLIN HFA) 108 (90 Base) MCG/ACT inhaler Inhale 1-2 puffs into the lungs every 6 (six) hours as needed for wheezing or shortness of breath.    [provider]  melatonin 3 MG TABS tablet Take 3 mg by mouth at bedtime.    [provider]  ondansetron (ZOFRAN) 4 MG tablet Take 1 tablet (4 mg total) by mouth every 6 (six) hours. 02/25/21   Marcello Fennel, PA-C  pantoprazole (PROTONIX) 40 MG tablet Take 40 mg by mouth daily. 08/15/20   [provider]  risperiDONE (RISPERDAL) 3 MG tablet Take 1 tablet (3 mg total) by mouth at bedtime. 02/08/20   Freida Busman, MD  sertraline (ZOLOFT) 50 MG tablet Take 3 tablets (150 mg total) by mouth daily. 03/04/21   Revonda Humphrey, NP      Medical Decision Making  Admit to continuous assessment for crisis stabilization  Continue home medications -Risperdal 3 mg QHS for mood stability/psychosis -sertraline 150 mg daily for depression -Protonix 40 mg daily for GERD  Agitation/Impulsive behaviors: -Risperdal M-Tabs 2 mg BID prn -Ativan 1 mg oral every 6 hours prn  Lab Orders         Resp Panel by RT-PCR (Flu  A&B, Covid) Nasopharyngeal Swab         CBC with Differential/Platelet         Comprehensive metabolic panel         Pregnancy, urine         POC SARS Coronavirus 2 Ag-ED - Nasal Swab         POCT Urine Drug Screen - ([facility])         POC SARS Coronavirus 2 Ag         Pregnancy, urine POC      Clinical Course as of 03/11/21 0620  Sat Mar 11, 2021  0121 EKG 12-Lead Vent. rate 92 BPM PR interval 126 ms QRS duration 80 ms QT/QTcB 370/457 ms P-R-T axes 51 46 30 Normal sinus rhythm Normal ECG When compared with ECG of 02-Mar-2021 21:17, PREVIOUS ECG IS PRESENT [JB]  0512 POCT Urine Drug Screen - ([facility]) UDS negative [JB]  0513 CBC with Differential/Platelet CBC unremarkable [JB]  0513 SARS Coronavirus 2 by RT PCR: NEGATIVE [JB]  0513 Preg Test, Ur: NEGATIVE [JB]    Clinical Course User Index [JB] Rozetta Nunnery, NP    Recommendations  Based on my evaluation the patient does not appear to have an emergency medical condition.  Rozetta Nunnery, NP 03/11/21  12:45 AM

## 2021-03-11 NOTE — Progress Notes (Signed)
°   03/10/21 2332  Patient Reported Information  How Did You Hear About [name]? Legal System  What Is the Reason for Your Visit/Call Today? Pt reports, she was discharged an inpatient facility yesterday but the voices "won't go away." Pt reports, hearing voices saying, "kill your self." Per pt, she's suicidal with a plan to cut her throat or overdose on pills. Pt reports, she cut last week. Pt reports, she can not contract for safety.  How Long Has This Been Causing You Problems? > than 6 months  What Do You Feel Would Help You the Most Today? Treatment for Depression or other mood problem  Have You Recently Had Any Thoughts About Hurting Yourself? Yes  Are You Planning to Commit Suicide/Harm Yourself At This time? Yes  Have you Recently Had Thoughts About Hurting Someone [name]? No  Are You Planning To Harm Someone At This Time? No  Have You Used Any Alcohol or Drugs in the Past 24 Hours? No  Do You Currently Have a Therapist/Psychiatrist? Yes  Name of Therapist/Psychiatrist Pt reports, she is linked to [facility] through [place]. Pt reports, she does not know her psychiatrist name she has not met him yet.  [facility] Screening Triage Referral Assessment  Type of Contact Face-to-Face  Location of Assessment [facility]  Provider location [facility] Assessment Services  Patient Determined To Be At Risk for Harm To Self or Others Based on Review of Patient Reported Information or Presenting Complaint? 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 No  [place] of Residence [name]  Patient Currently Receiving the Following Services: Individual Therapy  Determination of Need Urgent (48 hours)  Options For Referral Other: Comment;Medication Management;Inpatient Hospitalization;BH Urgent Care (Pt to be admitted to [facility] Continuous Assessment.)    Determination of need: Urgent.     Vertell Novak, [place], [facility], [facility] Triage Specialist [phone]

## 2021-03-11 NOTE — ED Provider Triage Note (Signed)
Emergency Medicine Provider Triage Evaluation Note  [name] , a 31 y.o. female  was evaluated in triage.  Pt complains of strokelike symptoms of 3-day duration.  Patient reports she was evaluated in behavioral health yesterday and they gave her an additional dose of her Risperdal.  She reports paresthesias in the bilateral upper extremities, slurring of her speech, and facial droop.  She is without the symptoms at this time.  Review of Systems  Positive: As above Negative: As above  Physical Exam  BP 117/80 (BP Location: Right Arm)    Pulse (!) 114    Temp (!) 97.5 F (36.4 C) (Oral)    Resp 20    SpO2 96%  Gen:   Awake, no distress   Resp:  Normal effort  MSK:   Moves extremities without difficulty  Other:  Without facial droop.  Speech is normal.  5/5 strength bilateral upper and lower extremities.  Without pronator drift.  Medical Decision Making  Medically screening exam initiated at 10:50 PM.  Appropriate orders placed.  [name] was informed that the remainder of the evaluation will be completed by another provider, this initial triage assessment does not replace that evaluation, and the importance of remaining in the ED until their evaluation is complete.     Evlyn Courier, PA-C 03/11/21 2252

## 2021-03-11 NOTE — ED Notes (Signed)
Patient to unit - still pulling at skin - nurse in obs area

## 2021-03-11 NOTE — ED Notes (Signed)
Pt reports that she feels like she took too much Risperdal yesterday. States " I had a total of 6mg  which is a lot for me" .  Pt awakens to voice command.  She was alert and appropriate and allowed this RN to get her vitals.  Pt denies si, hi or avh at time of assessment.  States that she "can take an [place] home..I feel safe".   Staff offered food and fluids but pt refused at this time.   Will continue to monitor for safety.

## 2021-03-11 NOTE — ED Notes (Signed)
Patient alert and oriented X 4 at time of discharge. Patient given After Visit summary with follow up instructions and community resources. Patient voiced understanding of instructions. Patient called an uber to transport home at her request.

## 2021-03-11 NOTE — ED Provider Notes (Signed)
FBC/OBS ASAP Discharge Summary  Date and Time: 03/11/2021 1:35 PM  Name: [name]  MRN:  [id number]   Discharge Diagnoses:  Final diagnoses:  Schizoaffective disorder, bipolar type ([place])  Suicidal ideation    Subjective: "I feel better."    [name], 31 y.o., female patient seen face to face by this provider, consulted with Dr. Ernie Hew; and chart reviewed on 03/11/21.  On evaluation [name] reports she is feeling better this morning.  Patient states she was having some suicidal thoughts with a plan to cut herself or from my.  States that she was just feeling overwhelmed related to the voices being a lot louder.  Patient states the voices are a lot lower than they were yesterday.  Reports her stressors is that she is unable to get custody of her daughter back who is currently living with paternal grandmother.  Patient reports she lives with her boyfriend who is supportive.  Patient gave permission to speak to boyfriend for collateral information.  Patient reported she does have outpatient psychiatric services through [facility].  States she has a community support team which is in the process of transitioning into [facility].  Patient reports she is compliant with her medications.  Patient denies suicidal/self-harm/homicidal ideations and paranoia.  Patient continues to report that she does have some auditory hallucination but it is much better and the voices are lower than they were yesterday. During evaluation [name] is sitting on side of bed in no acute distress.  She is alert, oriented x 4, calm, cooperative and attentive.  Her mood is anxious but euthymic with congruent affect.  She has normal speech, and behavior.  Objectively there is no evidence of psychosis/mania or delusional thinking.  Patient is able to converse coherently, goal directed thoughts, no distractibility, or pre-occupation.  She also denies  suicidal/self-harm/homicidal ideation, psychosis, and paranoia.  Patient answered question appropriately.   Patient gave permission to speak to boyfriend Malon Kindle at [phone].  [name] reports he has no problem with patient coming back home.  Reports that patient has been doing fine and did not even know anything was wrong.  Reports he does not believe the patient is a danger to herself or anyone else but he will be around her to assist her if needed.  Reports usually reaches out when she is having a problem.  Reports he is able to come pick up patient if she cannot catch a bus all over home.  Stay Summary: [name] was admitted to [facility] Continuous Assessment unit for Schizoaffective disorder, bipolar type [facility]) and crisis management.  She was treated with the following medications: Zoloft 150 mg daily, Resporal 3 mg daily at bedtime and 2 mg, melatonin 3 mg at bedtime as needed for sleep and Ativan 1 mg every 6 hours as needed anxiety  [name]'s improvement was monitored by continuous assessment/observation and her report of symptom reduction.  Her emotional and mental status was also monitored by staff.           [name] was evaluated for stability and plans for continued recovery upon discharge.  [name] motivation was an integral factor for scheduling further treatment.  The following was addressed as part of /her discharge planning and follow up treatment:  Employment, housing, transportation, bed availability, health status, family support, and any pending legal issues were also considered during her during the continuous assessment/observation.  She was offered further treatment options  upon discharge including but not limited to Residential, Intensive Outpatient, Outpatient treatment, Rehabilitation services, and resources for shelters and Half-way-house if needed.  [name] will follow up with the services as listed  below under Follow up Information.    Upon completion of this admission the [name] was both mentally and medically stable for discharge denying suicidal/homicidal ideation, auditory/visual/tactile hallucinations, delusional thoughts and paranoia.     Total Time spent with patient: 30 minutes  Past Psychiatric History: See below 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 fragile X syndrome    Chronic constipation    Depression    Drug-seeking behavior    Essential tremor    Headache    Overdose of acetaminophen 07/2017   and other meds   Personality disorder ([place])    Schizo-affective psychosis ([place])    Schizoaffective disorder, bipolar type ([place])    Seizures ([place])    Last seizure December 2017   Sleep apnea     Past Surgical History:  Procedure Laterality Date   MOUTH SURGERY  2009 or 2010   Family History:  Family History  Problem Relation Age of Onset   Mental illness Father    Asthma Father    PDD Brother    Seizures Brother    Family Psychiatric History: See above Social History:  Social History   Substance and Sexual Activity  Alcohol Use No   Alcohol/week: 1.0 standard drink   Types: 1 Standard drinks or equivalent per week   Comment: denies at this time     Social History   Substance and Sexual Activity  Drug Use No   Comment: History of cocaine use at age 63 for 4 months    Social History   Socioeconomic History   Marital status: Widowed    Spouse name: Not on file   Number of children: 0   Years of education: Not on file   Highest education level: Not on file  Occupational History   Occupation: disability  Tobacco Use   Smoking status: Former    Packs/day: 0.00    Types: Cigarettes   Smokeless tobacco: Never   Tobacco comments:    Smoked for 2  years age [age]  Vaping Use   Vaping Use: Never used  Substance and  Sexual Activity   Alcohol use: No    Alcohol/week: 1.0 standard drink    Types: 1 Standard drinks or equivalent per week    Comment: denies at this time   Drug use: No    Comment: History of cocaine use at age 64 for 4 months   Sexual activity: Yes    Birth control/protection: None  Other Topics Concern   Not on file  Social History Narrative   Marital status: Widowed      Children: daughter      Lives: with boyfriend, in two story home      Employment:  Disability      Tobacco: quit smoking; smoked for two years.      Alcohol ;none      Drugs: none   Has not traveled outside of the country.   Right handed         Social Determinants of Health   Financial Resource Strain: Not on file  Food Insecurity: Not on file  Transportation  Needs: Not on file  Physical Activity: Not on file  Stress: Not on file  Social Connections: Not on file   SDOH:  SDOH Screenings   Alcohol Screen: Not on file  Depression (PHQ2-9): Not on file  Financial Resource Strain: Not on file  Food Insecurity: Not on file  Housing: Not on file  Physical Activity: Not on file  Social Connections: Not on file  Stress: Not on file  Tobacco Use: Medium Risk   Smoking Tobacco Use: Former   Smokeless Tobacco Use: Never   Passive Exposure: Not on file  Transportation Needs: Not on file    Tobacco Cessation:  N/A, patient does not currently use tobacco products  Current Medications:  Current Facility-Administered Medications  Medication Dose Route Frequency Provider Last Rate Last Admin   acetaminophen (TYLENOL) tablet 650 mg  650 mg Oral Q6H PRN [name] A, NP       albuterol (VENTOLIN HFA) 108 (90 Base) MCG/ACT inhaler 1-2 puff  1-2 puff Inhalation Q6H PRN Rozetta Nunnery, NP       alum & mag hydroxide-simeth (MAALOX/MYLANTA) 200-200-20 MG/5ML suspension 30 mL  30 mL Oral Q4H PRN [name] A, NP       LORazepam (ATIVAN) tablet 1 mg  1 mg Oral Q6H PRN [name] A, NP   1 mg at 03/11/21 1119    magnesium hydroxide (MILK OF MAGNESIA) suspension 30 mL  30 mL Oral Daily PRN Rozetta Nunnery, NP       melatonin tablet 3 mg  3 mg Oral QHS [name] A, NP   3 mg at 03/11/21 0137   ondansetron (ZOFRAN) tablet 4 mg  4 mg Oral Q6H PRN Rozetta Nunnery, NP       pantoprazole (PROTONIX) EC tablet 40 mg  40 mg Oral Daily [name] A, NP   40 mg at 03/11/21 1119   risperiDONE (RISPERDAL M-TABS) disintegrating tablet 2 mg  2 mg Oral BID PRN Rozetta Nunnery, NP       risperiDONE (RISPERDAL) tablet 3 mg  3 mg Oral QHS [name] A, NP   3 mg at 03/11/21 0138   sertraline (ZOLOFT) tablet 150 mg  150 mg Oral Daily Rozetta Nunnery, NP       Current Outpatient Medications  Medication Sig Dispense Refill   albuterol (PROVENTIL HFA;VENTOLIN HFA) 108 (90 Base) MCG/ACT inhaler Inhale 1-2 puffs into the lungs every 6 (six) hours as needed for wheezing or shortness of breath.     melatonin 3 MG TABS tablet Take 3 mg by mouth at bedtime.     ondansetron (ZOFRAN) 4 MG tablet Take 1 tablet (4 mg total) by mouth every 6 (six) hours. 12 tablet 0   pantoprazole (PROTONIX) 40 MG tablet Take 40 mg by mouth daily.     risperiDONE (RISPERDAL) 3 MG tablet Take 1 tablet (3 mg total) by mouth at bedtime. 30 tablet 0   sertraline (ZOLOFT) 50 MG tablet Take 3 tablets (150 mg total) by mouth daily.      PTA Medications: (Not in a hospital admission)   Musculoskeletal  Strength & Muscle Tone: within normal limits Gait & Station: normal Patient leans: N/A  Psychiatric Specialty Exam  Presentation  General Appearance: Appropriate for Environment  Eye Contact:Fair  Speech:Clear and Coherent; Normal Rate  Speech Volume:Normal  Handedness:Right   Mood and Affect  Mood:Dysphoric  Affect:Congruent   Thought Process  Thought Processes:Coherent; Goal Directed; Linear  Descriptions of Associations:Intact  Orientation:Full (Time, Place and Person)  Thought Content:Logical  Diagnosis of Schizophrenia or  Schizoaffective disorder in past: No    Hallucinations:Hallucinations: Auditory (States that the voices are much lower today.  States voice are on/off but never really go away) Description of Auditory Hallucinations: voice tell her to kill herself  Ideas of Reference:None  Suicidal Thoughts:Suicidal Thoughts: No SI Active Intent and/or Plan: With Intent; With Plan; With Means to [place]  Homicidal Thoughts:Homicidal Thoughts: No   Sensorium  Memory:Immediate Good; Recent Good  Judgment:Fair  Insight:Present   Executive Functions  Concentration:Good  Attention Span:Good  [place] of Knowledge:Good  Language:Good   Psychomotor Activity  Psychomotor Activity:Psychomotor Activity: Normal   Assets  Assets:Communication Skills; Desire for Improvement; Financial Resources/Insurance; Social Support; Physical Health   Sleep  Sleep:Sleep: Good   Nutritional Assessment (For OBS and FBC admissions only) Has the patient had a weight loss or gain of 10 pounds or more in the last 3 months?: No Has the patient had a decrease in food intake/or appetite?: No Does the patient have dental problems?: No Does the patient have eating habits or behaviors that may be indicators of an eating disorder including binging or inducing vomiting?: No Has the patient recently lost weight without trying?: 0 Has the patient been eating poorly because of a decreased appetite?: 0 Malnutrition Screening Tool Score: 0    Physical Exam  Physical Exam Vitals and nursing note reviewed. Exam conducted with a chaperone present.  Constitutional:      General: She is not in acute distress.    Appearance: Normal appearance. She is not ill-appearing.  Cardiovascular:     Rate and Rhythm: Normal rate.  Pulmonary:     Effort: Pulmonary effort is normal.  Musculoskeletal:        General: Normal range of motion.     Cervical back: Normal range of motion.  Skin:    General: Skin is warm  and dry.  Neurological:     Mental Status: She is alert and oriented to person, place, and time.  Psychiatric:        Attention and Perception: She perceives auditory hallucinations.        Mood and Affect: Mood and affect normal.        Speech: Speech normal.        Behavior: Behavior normal. Behavior is cooperative.        Thought Content: Thought content normal. Thought content is not paranoid or delusional. Thought content does not include homicidal or suicidal ideation.        Cognition and Memory: Cognition and memory normal.        Judgment: Judgment is impulsive.   Review of Systems  Constitutional: Negative.   HENT: Negative.    Eyes: Negative.   Respiratory: Negative.    Cardiovascular: Negative.   Gastrointestinal: Negative.   Genitourinary: Negative.   Musculoskeletal: Negative.   Skin: Negative.   Neurological: Negative.   Endo/Heme/Allergies: Negative.   Psychiatric/Behavioral:  Depression: Stable. Hallucinations: Reporting better than yesterday; voice much lower. Suicidal ideas: Denies. Nervous/anxious: Stable.   Blood pressure 131/79, pulse (!) 111, temperature 98.1 F (36.7 C), temperature source Oral, resp. rate 18, SpO2 98 %. There is no height or weight on file to calculate BMI.  Demographic Factors:  Caucasian  Loss Factors: NA  Historical Factors: Impulsivity  Risk Reduction Factors:   Sense of responsibility to family, Religious beliefs about death, Living with another person, especially a relative, Positive social support,  and Positive therapeutic relationship  Continued Clinical Symptoms:  Previous Psychiatric Diagnoses and Treatments  Cognitive Features That Contribute To Risk:  None    Suicide Risk:  Minimal: No identifiable suicidal ideation.  Patients presenting with no risk factors but with morbid ruminations; may be classified as minimal risk based on the severity of the depressive symptoms  Plan Of Care/Follow-up recommendations:   Activity:  As tolerated  Disposition: No evidence of imminent risk to self or others at present.   Recommend psychiatric Inpatient admission when medically cleared. Patient does not meet criteria for psychiatric inpatient admission. Supportive therapy provided about ongoing stressors. Discussed crisis plan, support from social network, calling 911, coming to the Emergency Department, and calling Suicide Hotline. Follow up with [facility]     Discharge Instructions      Follow up with current outpatient psychatric provider.  [facility] Support Team  Safety Plan Azariya Lanyah Spengler will reach out to [name] (boyfriend), call 911 or call mobile crisis, or go to nearest emergency room if condition worsens or if suicidal thoughts become active Patients' will follow up with [facility] Sessions and resources given for outpatient psychiatric services (therapy/medication management).  The suicide prevention education provided includes the following: Suicide risk factors Suicide prevention and interventions National Suicide Hotline telephone number [facility] assessment telephone number [facility] Emergency Assistance [place] and/or Residential Mobile Crisis Unit telephone number Request made of family/significant other to:  Patient and [name] (boyfriend) Engineer, maintenance (e.g., guns, rifles, knives), all items previously/currently identified as safety concern.   Remove drugs/medications (over the counter, prescriptions, illicit drugs), all items previously/currently identified as a safety concern.     Mobile Crisis Response Teams Listed by counties in vicinity of [facility] providers [place]. [phone] [place] [phone] [place] [phone] [facility] [phone] [address] [phone]  [place]. [phone] [place] [phone]       Earleen Newport, NP 03/11/2021, 1:35 PM
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
## 2021-03-11 NOTE — Discharge Instructions (Addendum)
Follow up with current outpatient psychatric provider.  [facility] Support Team  Safety Plan Awilda Aziya Arena will reach out to [name] (boyfriend), call 911 or call mobile crisis, or go to nearest emergency room if condition worsens or if suicidal thoughts become active Patients' will follow up with Beverly Sessions and resources given for outpatient psychiatric services (therapy/medication management).  The suicide prevention education provided includes the following: Suicide risk factors Suicide prevention and interventions National Suicide Hotline telephone number [facility] assessment telephone number [facility] Emergency Assistance [place] and/or Residential Mobile Crisis Unit telephone number Request made of family/significant other to:  Patient and [name] (boyfriend) Engineer, maintenance (e.g., guns, rifles, knives), all items previously/currently identified as safety concern.   Remove drugs/medications (over the counter, prescriptions, illicit drugs), all items previously/currently identified as a safety concern.     Mobile Crisis Response Teams Listed by counties in vicinity of [facility] providers [place] [phone] [place] [phone] [place] [phone] [place] [facility] [phone] [place] [phone]                * [place] [phone]  [place]. [phone] [place].  [place] [phone]

## 2021-03-11 NOTE — ED Notes (Signed)
Pt currently sleeping soundly.  Breathing even and unlabored.  Will continue to monitor for safety.

## 2021-03-11 NOTE — ED Notes (Signed)
Pt was assessed by NP and then went back to sleep.  Currently Breathing is even and unlabored.  Pt visible from NS.   No distress noted.  Will continue to monitor for safety.

## 2021-03-11 NOTE — ED Notes (Signed)
Patient refused vitals.

## 2021-03-11 NOTE — ED Triage Notes (Signed)
Pt here via [facility] from home. Pt called EMS stating she was having a stroke, upon EMS arrival pt had no stroke symptoms. Pt reports having these symptoms for a few days, and dizziness that started 3 hours ago.

## 2021-03-12 DIAGNOSIS — R519 Headache, unspecified: Secondary | ICD-10-CM | POA: Diagnosis not present

## 2021-03-12 MED ORDER — METOCLOPRAMIDE HCL 5 MG/ML IJ SOLN
5.0000 mg | Freq: Once | INTRAMUSCULAR | Status: AC
  Administered 2021-03-12: 5 mg via INTRAVENOUS
  Filled 2021-03-12: qty 2

## 2021-03-12 MED ORDER — SODIUM CHLORIDE 0.9 % IV BOLUS
1000.0000 mL | Freq: Once | INTRAVENOUS | Status: AC
  Administered 2021-03-12: 1000 mL via INTRAVENOUS

## 2021-03-12 MED ORDER — DIPHENHYDRAMINE HCL 50 MG/ML IJ SOLN
12.5000 mg | Freq: Once | INTRAMUSCULAR | Status: AC
  Administered 2021-03-12: 12.5 mg via INTRAVENOUS
  Filled 2021-03-12: qty 1

## 2021-03-12 MED ORDER — KETOROLAC TROMETHAMINE 15 MG/ML IJ SOLN
15.0000 mg | Freq: Once | INTRAMUSCULAR | Status: AC
  Administered 2021-03-12: 15 mg via INTRAVENOUS
  Filled 2021-03-12: qty 1

## 2021-03-12 NOTE — ED Provider Notes (Signed)
[facility] EMERGENCY DEPARTMENT Provider Note   CSN: [id number] Arrival date & time: 03/11/21  2200     History  Chief Complaint  Patient presents with   Dizziness    [name] is a 31 y.o. female.  HPI   Pt is a 31  y/o female with a h/o GERD, anxiety, asthma, autism, personality disorder, schizoaffective psychosis, seizures, sleep apnea, who presents to the ED today for eval of multiple complaints. States she has had numbness to the right arm/leg that started 2 days ago. She also feels like her speech is abnormal. She further reports she has had a migraine for the last week.   She further reports increased thirst. She states that she thinks she had seizures last night while she was sleeping because she woke up shaking and "then I knocked back out". She was aware of the shaking when it occurred. Denies urinary incontinence.  Home Medications Prior to Admission medications   Medication Sig Start Date End Date Taking? Authorizing Provider  albuterol (PROVENTIL HFA;VENTOLIN HFA) 108 (90 Base) MCG/ACT inhaler Inhale 1-2 puffs into the lungs every 6 (six) hours as needed for wheezing or shortness of breath.   Yes [provider]  hydrOXYzine (ATARAX) 25 MG tablet Take 25 mg by mouth every 6 (six) hours as needed for anxiety.   Yes [provider]  ibuprofen (ADVIL) 200 MG tablet Take 400 mg by mouth every 6 (six) hours as needed for headache or moderate pain.   Yes [provider]  melatonin 3 MG TABS tablet Take 3 mg by mouth at bedtime.   Yes [provider]  ondansetron (ZOFRAN) 4 MG tablet Take 1 tablet (4 mg total) by mouth every 6 (six) hours. Patient taking differently: Take 4 mg by mouth every 8 (eight) hours as needed for vomiting or nausea. 02/25/21  Yes Marcello Fennel, PA-C  pantoprazole (PROTONIX) 40 MG tablet Take 40 mg by mouth daily. 08/15/20  Yes [provider]  risperiDONE (RISPERDAL) 3 MG  tablet Take 1 tablet (3 mg total) by mouth at bedtime. 02/08/20  Yes Freida Busman, MD  sertraline (ZOLOFT) 50 MG tablet Take 3 tablets (150 mg total) by mouth daily. 03/04/21  Yes Revonda Humphrey, NP      Allergies    Bee venom, Coconut flavor, Geodon [ziprasidone hcl], Haloperidol and related, Lithium, Oxycodone, Quetiapine, Seroquel [quetiapine fumarate], Shellfish allergy, Phenergan [promethazine hcl], Prilosec [omeprazole], Sulfa antibiotics, Tegretol [carbamazepine], Prozac [fluoxetine], and Tape    Review of Systems   Review of Systems See HPI for pertinent positives or negatives.   Physical Exam Updated Vital Signs BP 113/72    Pulse 95    Temp (!) 97.5 F (36.4 C) (Oral)    Resp 13    SpO2 99%  Physical Exam Vitals and nursing note reviewed.  Constitutional:      General: She is not in acute distress.    Appearance: She is well-developed.  HENT:     Head: Normocephalic and atraumatic.  Eyes:     Conjunctiva/sclera: Conjunctivae normal.  Cardiovascular:     Rate and Rhythm: Normal rate and regular rhythm.     Heart sounds: No murmur heard. Pulmonary:     Effort: Pulmonary effort is normal. No respiratory distress.     Breath sounds: Normal breath sounds.  Abdominal:     Palpations: Abdomen is soft.     Tenderness: There is no abdominal tenderness.  Musculoskeletal:  General: No swelling.     Cervical back: Neck supple.  Skin:    General: Skin is warm and dry.     Capillary Refill: Capillary refill takes less than 2 seconds.  Neurological:     Mental Status: She is alert.     Comments: Mental Status:  Alert, thought content appropriate, able to give a coherent history. Speech fluent without evidence of aphasia. Able to follow 2 step commands without difficulty.  Cranial Nerves:  II:  Peripheral visual fields grossly normal, pupils equal, round, reactive to light III,IV, VI: ptosis not present, extra-ocular motions intact bilaterally  V,VII: smile  symmetric, facial light touch sensation decreased on the right (reportedly also to the forehead) VIII: hearing grossly normal to voice  X: uvula elevates symmetrically  XI: bilateral shoulder shrug symmetric and strong XII: midline tongue extension without fassiculations Motor:  Normal tone. 5/5 strength of BUE and BLE major muscle groups with exception of decreased right grip strength which I suspect is effort dependant Sensory: decreased sensation reports to the rue/rle Gait: normal gait and balance.   Psychiatric:        Mood and Affect: Mood normal.     ED Results / Procedures / Treatments   Labs (all labs ordered are listed, but only abnormal results are displayed) Labs Reviewed  BASIC METABOLIC PANEL - Abnormal; Notable for the following components:      Result Value   Glucose, Bld 113 (*)    All other components within normal limits  CBC WITH DIFFERENTIAL/PLATELET    EKG None  Radiology CT Head Wo Contrast  Result Date: 03/11/2021 CLINICAL DATA:  Headache, new or worsening. EXAM: CT HEAD WITHOUT CONTRAST TECHNIQUE: Contiguous axial images were obtained from the base of the skull through the vertex without intravenous contrast. RADIATION DOSE REDUCTION: This exam was performed according to the departmental dose-optimization program which includes automated exposure control, adjustment of the mA and/or kV according to patient size and/or use of iterative reconstruction technique. COMPARISON:  MRI 05/31/2015.  CT 04/15/2015. FINDINGS: Brain: The brain shows a normal appearance without evidence of malformation, atrophy, old or acute small or large vessel infarction, mass lesion, hemorrhage, hydrocephalus or extra-axial collection. Vascular: No hyperdense vessel. No evidence of atherosclerotic calcification. Skull: Normal.  No traumatic finding.  No focal bone lesion. Sinuses/Orbits: Sinuses are clear. Orbits appear normal. Mastoids are clear. Other: None significant IMPRESSION:  Normal head CT.  No abnormality seen to explain headache. Electronically Signed   By: Nelson Chimes M.D.   On: 03/11/2021 23:35    Procedures Procedures    Medications Ordered in ED Medications  sodium chloride 0.9 % bolus 1,000 mL (0 mLs Intravenous Stopped 03/12/21 0200)  ketorolac (TORADOL) 15 MG/ML injection 15 mg (15 mg Intravenous Given 03/12/21 0037)  metoCLOPramide (REGLAN) injection 5 mg (5 mg Intravenous Given 03/12/21 0037)  diphenhydrAMINE (BENADRYL) injection 12.5 mg (12.5 mg Intravenous Given 03/12/21 0038)    ED Course/ Medical Decision Making/ A&P                           Medical Decision Making Risk Prescription drug management.   This patient presents to the ED for concern of headache and numbness, this involves an extensive number of treatment options, and is a complaint that carries with it a high risk of complications and morbidity.  The differential diagnosis includes CVA, MS, complicated migraine   Comorbidities that complicate the patient evaluation: Patients presentation is  complicated by their history of seizures  Social Determinants of Health: Patients unhoused  increases the complexity of managing their presentation  Additional history obtained: Records reviewed Care Everywhere/External Records  Lab Tests: I Ordered, and personally interpreted labs.  The pertinent results include:   CBC wnl BMP unremarkable Urine preg neg Covid neg  Imaging Studies ordered: I ordered imaging studies including CT scan head   I independently visualized and interpreted imaging which showed  CT head - unremarkable I agree with the radiologist interpretation  Cardiac Monitoring: The patient was maintained on a cardiac monitor.  I personally viewed and interpreted the cardiac monitor which showed an underlying rhythm of:  sinus tachycardia  Medicines ordered and prescription drug management: I ordered medication including toradol, reglan, benadryl  for migraine   Reevaluation of the patient after these medicines showed that the patient    improved  Test Considered: MRI Patient is low risk / negative by resolution of symptoms with migraine cocktail, therefore do not feel that MRI is indicated. I have low suspicion for CVA or other emergent cause of symptoms.   Reevaluation: After the interventions noted above, I reevaluated the patient and found that they have :improved  Complexity of problems addressed: Patients presentation is most consistent with  acute presentation with potential threat to life or bodily function  Disposition: After consideration of the diagnostic results and the patients response to treatment,  I feel that the patent would benefit from discharge home with close follow-up with neurology.  I have low suspicion for any emergent neurologic cause.  I suspect that her symptoms may be related to a complicated migraine.  She does not have any objective neurologic deficits on my exam and her symptoms completely resolved after migraine cocktail.  Her laboratory work is reassuring.  She is ambulatory in the department without difficulty or ataxia.  Feel she is appropriate for discharge home.  She was given an ambulatory referral to neurology.  She is advised to return to the ED for new or worsening symptoms the meantime.  She voiced understanding the plan and reasons to return.  Questions answered.  Patient able for discharge. .   Final Clinical Impression(s) / ED Diagnoses Final diagnoses:  Acute nonintractable headache, unspecified headache type    Rx / DC Orders ED Discharge Orders          Ordered    Ambulatory referral to Neurology       Comments: An appointment is requested in approximately: 2 weeks   03/12/21 0153              Rodney Booze, PA-C 03/12/21 0401    Mesner, Corene Cornea, MD 03/12/21 [id number]

## 2021-03-12 NOTE — Discharge Instructions (Signed)
You were given an ambulatory referral to a neurologist in regards to your headache today.  They will call you to schedule an appointment for follow-up.  Please follow up with your primary care provider within 5-7 days for re-evaluation of your symptoms. If you do not have a primary care provider, information for a healthcare clinic has been provided for you to make arrangements for follow up care. Please return to the emergency department for any new or worsening symptoms.

## 2021-03-14 DIAGNOSIS — F4310 Post-traumatic stress disorder, unspecified: Secondary | ICD-10-CM | POA: Diagnosis not present

## 2021-03-14 DIAGNOSIS — F250 Schizoaffective disorder, bipolar type: Secondary | ICD-10-CM | POA: Diagnosis not present

## 2021-03-14 DIAGNOSIS — F603 Borderline personality disorder: Secondary | ICD-10-CM | POA: Diagnosis not present

## 2021-03-16 DIAGNOSIS — H6693 Otitis media, unspecified, bilateral: Secondary | ICD-10-CM | POA: Diagnosis not present

## 2021-03-16 DIAGNOSIS — R000 Tachycardia, unspecified: Secondary | ICD-10-CM | POA: Diagnosis not present

## 2021-03-20 DIAGNOSIS — R45851 Suicidal ideations: Secondary | ICD-10-CM | POA: Diagnosis not present

## 2021-03-20 DIAGNOSIS — H6693 Otitis media, unspecified, bilateral: Secondary | ICD-10-CM | POA: Diagnosis not present

## 2021-03-21 ENCOUNTER — Telehealth (HOSPITAL_COMMUNITY): Payer: Self-pay | Admitting: Family Medicine

## 2021-03-21 NOTE — BH Assessment (Signed)
Care Management - Follow Up [facility] Discharges   Patient has been placed in an inpatient psychiatric hospital ([facility]) on 03-03-2021.

## 2021-03-23 ENCOUNTER — Ambulatory Visit (HOSPITAL_COMMUNITY)
Admission: EM | Admit: 2021-03-23 | Discharge: 2021-03-23 | Disposition: A | Discharge status: Home / Self Care | Payer: PPO

## 2021-03-23 ENCOUNTER — Emergency Department (HOSPITAL_COMMUNITY)
Admission: EM | Admit: 2021-03-23 | Discharge: 2021-03-23 | Disposition: A | Discharge status: Home / Self Care | Payer: PPO | Attending: Emergency Medicine | Admitting: Emergency Medicine

## 2021-03-23 ENCOUNTER — Other Ambulatory Visit: Payer: Self-pay

## 2021-03-23 ENCOUNTER — Encounter (HOSPITAL_COMMUNITY): Payer: Self-pay | Admitting: Emergency Medicine

## 2021-03-23 DIAGNOSIS — F419 Anxiety disorder, unspecified: Secondary | ICD-10-CM | POA: Insufficient documentation

## 2021-03-23 DIAGNOSIS — R0789 Other chest pain: Secondary | ICD-10-CM | POA: Diagnosis not present

## 2021-03-23 DIAGNOSIS — Z5321 Procedure and treatment not carried out due to patient leaving prior to being seen by health care provider: Secondary | ICD-10-CM | POA: Insufficient documentation

## 2021-03-23 DIAGNOSIS — R457 State of emotional shock and stress, unspecified: Secondary | ICD-10-CM | POA: Diagnosis not present

## 2021-03-23 DIAGNOSIS — G4489 Other headache syndrome: Secondary | ICD-10-CM | POA: Diagnosis not present

## 2021-03-23 DIAGNOSIS — R079 Chest pain, unspecified: Secondary | ICD-10-CM | POA: Diagnosis not present

## 2021-03-23 DIAGNOSIS — F29 Unspecified psychosis not due to a substance or known physiological condition: Secondary | ICD-10-CM | POA: Diagnosis not present

## 2021-03-23 NOTE — ED Triage Notes (Signed)
Pt present to [facility] with a complaint of stress. Pt states that she has been going on job interviews but has not been getting any offers and she has not received her check from [place] so this has been causing her stress. Pt states " I just want to talk to somebody". Pt denies SI/HI and AVH. Pt is routine.

## 2021-03-23 NOTE — ED Notes (Signed)
Pt witnessed walking out of ER.

## 2021-03-23 NOTE — ED Triage Notes (Signed)
BIBA Per EMS; pt coming from [facility] w/ c/o anxiety. Vitals WDL.  102 CBG  124/90  98HR

## 2021-03-26 DIAGNOSIS — Z111 Encounter for screening for respiratory tuberculosis: Secondary | ICD-10-CM | POA: Diagnosis not present

## 2021-03-29 DIAGNOSIS — Z111 Encounter for screening for respiratory tuberculosis: Secondary | ICD-10-CM | POA: Diagnosis not present

## 2021-03-30 ENCOUNTER — Ambulatory Visit (INDEPENDENT_AMBULATORY_CARE_PROVIDER_SITE_OTHER): Payer: PPO | Admitting: Family

## 2021-03-30 ENCOUNTER — Encounter: Payer: Self-pay | Admitting: Family

## 2021-03-30 ENCOUNTER — Other Ambulatory Visit: Payer: Self-pay

## 2021-03-30 VITALS — BP 105/68 | HR 110 | Temp 98.5°F | Ht 64.0 in | Wt 218.4 lb

## 2021-03-30 DIAGNOSIS — H6690 Otitis media, unspecified, unspecified ear: Secondary | ICD-10-CM

## 2021-03-30 DIAGNOSIS — R000 Tachycardia, unspecified: Secondary | ICD-10-CM | POA: Diagnosis not present

## 2021-03-30 DIAGNOSIS — H6121 Impacted cerumen, right ear: Secondary | ICD-10-CM | POA: Diagnosis not present

## 2021-03-30 DIAGNOSIS — F250 Schizoaffective disorder, bipolar type: Secondary | ICD-10-CM | POA: Diagnosis not present

## 2021-03-30 MED ORDER — AMOXICILLIN-POT CLAVULANATE 875-125 MG PO TABS: 1.0000 | ORAL_TABLET | Freq: Two times a day (BID) | ORAL | 0 refills | Status: DC

## 2021-03-30 NOTE — Assessment & Plan Note (Signed)
Chronic - pt under care of [facility] and [facility], sees [facility] q30m and therapist weekly. pt also states she has Asperger's syndrome and has been on disability since age 31, but was able to live on her own since age 60. reports she has an ex-husband and a daughter who is in custody of her grandmother right now. 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 Advised pt I am not comfortable signing this form for 2 reasons. 1 - she reports tachycardia when walking up steps up to 152bpm and told by her [facility] provider she needs a stress test (requesting records). 2- she has multiple mental health issues and prefer her PSYCH sign off that portion of the form if they feel she is mentally healthy. She had a psychotic episode 3 weeks ago and was seen in [facility] urgent care.

## 2021-03-30 NOTE — Patient Instructions (Addendum)
Welcome to [facility]! It was a pleasure meeting you today.  As discussed, due to your fast heart rate (tachycardia) and your mental health issues, I can not sign your medical form today. Please contact [facility] about signing the form in regards to your mental health, since they are familiar with you. I would like to see records from [place] that come out to your home regarding your tachycardia and then see about treatment.  I have continued the Augmentin for 5 more days to clear up the infection in your right ear.  Please schedule a 1 month follow up visit today.     PLEASE NOTE:  If you had any LAB tests please let us know if you have not heard back within a few days. You may see your results on MyChart before we have a chance to review them but we will give you a call once they are reviewed by [name]. If we ordered any REFERRALS today, please let us know if you have not heard from their office within the next week.  Let us know through MyChart if you are needing REFILLS, or have your pharmacy send [name] the request. You can also use MyChart to communicate with me or any office staff.  Please try these tips to maintain a healthy lifestyle:  Eat most of your calories during the day when you are active. Eliminate processed foods including packaged sweets (pies, cakes, cookies), reduce intake of potatoes, white bread, white pasta, and white rice. Look for whole grain options, oat flour or almond flour.  Each meal should contain half fruits/vegetables, one quarter protein, and one quarter carbs (no bigger than a computer mouse).  Cut down on sweet beverages. This includes juice, soda, and sweet tea. Also watch fruit intake, though this is a healthier sweet option, it still contains natural sugar! Limit to 3 servings daily.  Drink at least 1 glass of water with each meal and aim for at least 8 glasses per day  Exercise at least 150 minutes every week.

## 2021-03-30 NOTE — Progress Notes (Signed)
New Patient Office Visit  Subjective:  Patient ID: [name], female    DOB: 07/13/1990  Age: 31 y.o. MRN: [id number]  CC:  Chief Complaint  Patient presents with   Ear Pain    Started yesterday, since having a ear infection.    Sore Throat    Symptoms started this morning.    Annual Exam    HPI [name] presents for establishing care and to discuss an acute and chronic problem.  Ear pain - pt describes a 2 weeks history of Bilateral ear pain with fullness, pressure. Denies associated discharge, itching, dizziness is not associate, no fever, no other sinus sx. Pt reports having covid and then developed left ear infection, finished Augmentin last Friday, and then her right ear started hurting yesterday.   Anxiety/Depression: Patient complains of anxiety disorder, bipolar disorder, and schizoaffective disorder .   She has the following symptoms: psychomotor agitation.  Onset of symptoms was approximately  years ago, She denies current suicidal and homicidal ideation.  Possible organic causes contributing are:  Asperger's syndrome .  Risk factors: previous episode of depression  Previous treatment includes Zoloft and Risperadol, Atarax  and group therapy and individual therapy.  She complains of the following side effects from the treatment: none. Pt reports going on disability at age 80 d/t mental health. Able to get her own apartment and live independently at age 40, hx of marriage and 1 dtr that lives with her grandmother. Pt now wanting to work as her disability check has decreased.   Past Medical History:  Diagnosis Date   Acid reflux    Anxiety    Asthma    last attack 03/13/15 or 03/14/15   Autism    Carrier of fragile X syndrome    Chronic constipation    Depression    Drug-seeking behavior    Essential tremor    Headache    Overdose of acetaminophen 07/2017   and other meds   Personality disorder ([name])    Schizo-affective psychosis ([place])     Schizoaffective disorder, bipolar type ([address])    Seizures ([place])    Last seizure December 2017   Sleep apnea     Past Surgical History:  Procedure Laterality Date   MOUTH SURGERY  2009 or 2010    Family History  Problem Relation Age of Onset   Mental illness Father    Asthma Father    PDD Brother    Seizures Brother     Social History   Socioeconomic History   Marital status: Widowed    Spouse name: Not on file   Number of children: 0   Years of education: Not on file   Highest education level: Not on file  Occupational History   Occupation: disability  Tobacco Use   Smoking status: Former    Packs/day: 0.00    Types: Cigarettes   Smokeless tobacco: Never   Tobacco comments:    Smoked for 2  years age [age]  Vaping Use   Vaping Use: Never used  Substance and Sexual Activity   Alcohol use: No    Alcohol/week: 1.0 standard drink    Types: 1 Standard drinks or equivalent per week    Comment: denies at this time   Drug use: No    Comment: History of cocaine use at age 44 for 4 months   Sexual activity: Yes    Birth control/protection: None  Other Topics Concern   Not on file  Social  History Narrative   Marital status: Widowed      Children: daughter      Lives: with boyfriend, in two story home      Employment:  Disability      Tobacco: quit smoking; smoked for two years.      Alcohol ;none      Drugs: none   Has not traveled outside of the country.   Right handed         Social Determinants of Health   Financial Resource Strain: Not on file  Food Insecurity: Not on file  Transportation Needs: Not on file  Physical Activity: Not on file  Stress: Not on file  Social Connections: Not on file  Intimate Partner Violence: Not on file    Objective:   Today's Vitals: BP 105/68    Pulse (!) 110    Temp 98.5 F (36.9 C) (Temporal)    Ht 5\' 4"  (1.626 m)    Wt 218 lb 6.4 oz (99.1 kg)    SpO2 98%    BMI 37.49 kg/m   Physical Exam Vitals and nursing note  reviewed.  Constitutional:      Appearance: Normal appearance. She is obese.  HENT:     Right Ear: Tenderness present. No drainage. There is impacted cerumen. Tympanic membrane is injected.     Left Ear: Tympanic membrane and ear canal normal.     Ears:     Comments: erythema noted in ear canal down to base of TM after ear lavage. Most of TM visible, dull gray in color. Cardiovascular:     Rate and Rhythm: Normal rate and regular rhythm.  Pulmonary:     Effort: Pulmonary effort is normal.     Breath sounds: Normal breath sounds.  Musculoskeletal:        General: Normal range of motion.  Skin:    General: Skin is warm and dry.  Neurological:     Mental Status: She is alert.  Psychiatric:        Mood and Affect: Mood normal.        Behavior: Behavior normal.    Assessment & Plan:   Problem List Items Addressed This Visit       Other   Schizoaffective disorder, bipolar type ([place])    Chronic - pt under care of [facility] and [facility], sees [facility] q78m and therapist weekly. pt also states she has Asperger's syndrome and has been on disability since age 57, but was able to live on her own since age 42. reports she has an ex-husband and a daughter who is in custody of her grandmother right now. 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 Advised pt I am not comfortable signing this form for 2 reasons. 1 - she reports tachycardia when walking up steps up to 152bpm and told by her [facility] provider she needs a stress test (requesting records). 2- she has multiple mental health issues and prefer her PSYCH sign off that portion of the form if they feel she is mentally healthy. She had a psychotic episode 3 weeks ago and was seen in [facility] urgent care.      Tachycardia    pt states [facility] came to her own and  checked her resting HR vs exercise induced (walking up stairs)  and her HR shot up to upper 150s and advised she get a stress test done. Requesting records.      Other Visit Diagnoses     Impacted cerumen of right ear    -  Primary Received verbal consent to soak and flush ear to remove cerumen, pt tolerated well, able to successfully remove.    Acute otitis media, unspecified otitis media type       Right ear canal erythematous down to base of TM, TM dark grey in color, not retracted. pt reports pain, had been treated for left ear infection over a week ago and at f/u, told her right ear was starting to look bad. Will continue Augmentin for 5 more days.  Relevant Medications   amoxicillin-clavulanate (AUGMENTIN) 875-125 MG tablet       Outpatient Encounter Medications as of 03/30/2021  Medication Sig   albuterol (PROVENTIL HFA;VENTOLIN HFA) 108 (90 Base) MCG/ACT inhaler Inhale 1-2 puffs into the lungs every 6 (six) hours as needed for wheezing or shortness of breath.   amoxicillin-clavulanate (AUGMENTIN) 875-125 MG tablet Take 1 tablet by mouth 2 (two) times daily.   hydrOXYzine (ATARAX) 25 MG tablet Take 25 mg by mouth every 6 (six) hours as needed for anxiety.   ibuprofen (ADVIL) 200 MG tablet Take 400 mg by mouth every 6 (six) hours as needed for headache or moderate pain.   melatonin 3 MG TABS tablet Take 3 mg by mouth at bedtime.   ondansetron (ZOFRAN) 4 MG tablet Take 1 tablet (4 mg total) by mouth every 6 (six) hours. (Patient taking differently: Take 4 mg by mouth every 8 (eight) hours as needed for vomiting or nausea.)   pantoprazole (PROTONIX) 40 MG tablet Take 40 mg by mouth daily.   risperiDONE (RISPERDAL) 3 MG tablet Take 1 tablet (3 mg total) by mouth at bedtime.   sertraline (ZOLOFT) 50 MG tablet Take 3 tablets (150 mg total) by mouth daily.   No facility-administered encounter medications on file as of 03/30/2021.    Follow-up: Return in about 4 weeks (around 04/27/2021)  for tachycardia.   Jeanie Sewer, NP

## 2021-03-30 NOTE — Assessment & Plan Note (Addendum)
pt states [facility] came to her own and checked her resting HR vs exercise induced (walking up stairs) and her HR shot up to upper 150s, denied any dizziness, but caused SOB, she was advised to get a stress test done. Requesting records.

## 2021-04-03 ENCOUNTER — Telehealth: Payer: Self-pay | Admitting: Family

## 2021-04-03 NOTE — Telephone Encounter (Signed)
Patient stated she needs a physcal form to be completed. Stated she brought form with  her on 2/23 at her NP appt - Stated [place] told her the emotional part needed to be filled out by her psychiatrists. Her  psychiatrist will not complete form, stated her PCP is to fill form. 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

## 2021-04-05 NOTE — Telephone Encounter (Signed)
If they are not willing to sign the form as they know her better and for a longer time, neither am I.  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 Thx

## 2021-04-06 ENCOUNTER — Other Ambulatory Visit: Payer: Self-pay | Admitting: Family

## 2021-04-06 ENCOUNTER — Ambulatory Visit (HOSPITAL_COMMUNITY): Admission: RE | Admit: 2021-04-06 | Payer: PPO | Source: Ambulatory Visit

## 2021-04-06 NOTE — Telephone Encounter (Signed)
Front desk was able to give message below. Pt has further questions or concerns, but disconnected call when she was being transferred. I lvm for the pt to call the office back.  ?

## 2021-04-11 ENCOUNTER — Ambulatory Visit: Payer: PPO | Admitting: Obstetrics and Gynecology

## 2021-04-13 ENCOUNTER — Ambulatory Visit: Payer: PPO | Admitting: Family

## 2021-04-20 ENCOUNTER — Ambulatory Visit (HOSPITAL_COMMUNITY)
Admission: EM | Admit: 2021-04-20 | Discharge: 2021-04-21 | Disposition: A | Discharge status: Other Acute Inpatient Hospital | Payer: PPO | Attending: Psychiatry | Admitting: Psychiatry

## 2021-04-20 DIAGNOSIS — F250 Schizoaffective disorder, bipolar type: Secondary | ICD-10-CM | POA: Insufficient documentation

## 2021-04-20 DIAGNOSIS — F251 Schizoaffective disorder, depressive type: Secondary | ICD-10-CM | POA: Insufficient documentation

## 2021-04-20 DIAGNOSIS — R45851 Suicidal ideations: Secondary | ICD-10-CM | POA: Insufficient documentation

## 2021-04-20 DIAGNOSIS — Z20822 Contact with and (suspected) exposure to covid-19: Secondary | ICD-10-CM | POA: Insufficient documentation
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
## 2021-04-21 ENCOUNTER — Encounter (HOSPITAL_COMMUNITY): Payer: Self-pay | Admitting: Emergency Medicine

## 2021-04-21 ENCOUNTER — Inpatient Hospital Stay (HOSPITAL_COMMUNITY)
Admission: AD | Admit: 2021-04-21 | Discharge: 2021-04-26 | DRG: 885 | Disposition: A | Discharge status: Home / Self Care | Payer: PPO | Source: Intra-hospital | Attending: Psychiatry | Admitting: Psychiatry

## 2021-04-21 ENCOUNTER — Other Ambulatory Visit: Payer: Self-pay

## 2021-04-21 DIAGNOSIS — Z882 Allergy status to sulfonamides status: Secondary | ICD-10-CM

## 2021-04-21 DIAGNOSIS — F4010 Social phobia, unspecified: Secondary | ICD-10-CM | POA: Diagnosis present

## 2021-04-21 DIAGNOSIS — F840 Autistic disorder: Secondary | ICD-10-CM | POA: Diagnosis not present

## 2021-04-21 DIAGNOSIS — R630 Anorexia: Secondary | ICD-10-CM | POA: Diagnosis present

## 2021-04-21 DIAGNOSIS — R45851 Suicidal ideations: Secondary | ICD-10-CM

## 2021-04-21 DIAGNOSIS — F333 Major depressive disorder, recurrent, severe with psychotic symptoms: Secondary | ICD-10-CM

## 2021-04-21 DIAGNOSIS — Z148 Genetic carrier of other disease: Secondary | ICD-10-CM | POA: Diagnosis not present

## 2021-04-21 DIAGNOSIS — Z91048 Other nonmedicinal substance allergy status: Secondary | ICD-10-CM

## 2021-04-21 DIAGNOSIS — Z9151 Personal history of suicidal behavior: Secondary | ICD-10-CM | POA: Diagnosis not present

## 2021-04-21 DIAGNOSIS — K219 Gastro-esophageal reflux disease without esophagitis: Secondary | ICD-10-CM | POA: Diagnosis not present

## 2021-04-21 DIAGNOSIS — K5909 Other constipation: Secondary | ICD-10-CM | POA: Diagnosis not present

## 2021-04-21 DIAGNOSIS — Z62811 Personal history of psychological abuse in childhood: Secondary | ICD-10-CM | POA: Diagnosis not present

## 2021-04-21 DIAGNOSIS — M1990 Unspecified osteoarthritis, unspecified site: Secondary | ICD-10-CM | POA: Diagnosis not present

## 2021-04-21 DIAGNOSIS — Z91411 Personal history of adult psychological abuse: Secondary | ICD-10-CM

## 2021-04-21 DIAGNOSIS — E669 Obesity, unspecified: Secondary | ICD-10-CM | POA: Diagnosis present

## 2021-04-21 DIAGNOSIS — F17210 Nicotine dependence, cigarettes, uncomplicated: Secondary | ICD-10-CM | POA: Diagnosis not present

## 2021-04-21 DIAGNOSIS — F411 Generalized anxiety disorder: Secondary | ICD-10-CM | POA: Diagnosis present

## 2021-04-21 DIAGNOSIS — Z6837 Body mass index (BMI) 37.0-37.9, adult: Secondary | ICD-10-CM | POA: Diagnosis not present

## 2021-04-21 DIAGNOSIS — G4700 Insomnia, unspecified: Secondary | ICD-10-CM | POA: Diagnosis not present

## 2021-04-21 DIAGNOSIS — F609 Personality disorder, unspecified: Secondary | ICD-10-CM | POA: Diagnosis present

## 2021-04-21 DIAGNOSIS — F250 Schizoaffective disorder, bipolar type: Secondary | ICD-10-CM | POA: Diagnosis not present

## 2021-04-21 DIAGNOSIS — Z91030 Bee allergy status: Secondary | ICD-10-CM | POA: Diagnosis not present

## 2021-04-21 DIAGNOSIS — F22 Delusional disorders: Secondary | ICD-10-CM

## 2021-04-21 DIAGNOSIS — F251 Schizoaffective disorder, depressive type: Secondary | ICD-10-CM

## 2021-04-21 DIAGNOSIS — F845 Asperger's syndrome: Secondary | ICD-10-CM

## 2021-04-21 DIAGNOSIS — Z62810 Personal history of physical and sexual abuse in childhood: Secondary | ICD-10-CM | POA: Diagnosis not present

## 2021-04-21 DIAGNOSIS — Z91013 Allergy to seafood: Secondary | ICD-10-CM

## 2021-04-21 DIAGNOSIS — Z87820 Personal history of traumatic brain injury: Secondary | ICD-10-CM | POA: Diagnosis not present

## 2021-04-21 DIAGNOSIS — Z91410 Personal history of adult physical and sexual abuse: Secondary | ICD-10-CM | POA: Diagnosis not present

## 2021-04-21 DIAGNOSIS — F259 Schizoaffective disorder, unspecified: Principal | ICD-10-CM

## 2021-04-21 DIAGNOSIS — Z20822 Contact with and (suspected) exposure to covid-19: Secondary | ICD-10-CM | POA: Diagnosis present

## 2021-04-21 DIAGNOSIS — Z888 Allergy status to other drugs, medicaments and biological substances status: Secondary | ICD-10-CM

## 2021-04-21 DIAGNOSIS — Z818 Family history of other mental and behavioral disorders: Secondary | ICD-10-CM

## 2021-04-21 DIAGNOSIS — Z9102 Food additives allergy status: Secondary | ICD-10-CM

## 2021-04-21 LAB — POCT URINE DRUG SCREEN - MANUAL ENTRY (I-SCREEN)
POC Amphetamine UR: NOT DETECTED
POC Buprenorphine (BUP): NOT DETECTED
POC Cocaine UR: NOT DETECTED
POC Marijuana UR: NOT DETECTED
POC Methadone UR: NOT DETECTED
POC Methamphetamine UR: NOT DETECTED
POC Morphine: NOT DETECTED
POC Oxazepam (BZO): NOT DETECTED
POC Oxycodone UR: NOT DETECTED
POC Secobarbital (BAR): NOT DETECTED

## 2021-04-21 LAB — CBC WITH DIFFERENTIAL/PLATELET
Abs Immature Granulocytes: 0.03 10*3/uL (ref 0.00–0.07)
Basophils Absolute: 0 10*3/uL (ref 0.0–0.1)
Basophils Relative: 0 %
Eosinophils Absolute: 0 10*3/uL (ref 0.0–0.5)
Eosinophils Relative: 0 %
HCT: 37.2 % (ref 36.0–46.0)
Hemoglobin: 12 g/dL (ref 12.0–15.0)
Immature Granulocytes: 0 %
Lymphocytes Relative: 30 %
Lymphs Abs: 2.7 10*3/uL (ref 0.7–4.0)
MCH: 25.9 pg — ABNORMAL LOW (ref 26.0–34.0)
MCHC: 32.3 g/dL (ref 30.0–36.0)
MCV: 80.3 fL (ref 80.0–100.0)
Monocytes Absolute: 0.6 10*3/uL (ref 0.1–1.0)
Monocytes Relative: 6 %
Neutro Abs: 5.6 10*3/uL (ref 1.7–7.7)
Neutrophils Relative %: 64 %
Platelets: 275 10*3/uL (ref 150–400)
RBC: 4.63 MIL/uL (ref 3.87–5.11)
RDW: 13.4 % (ref 11.5–15.5)
WBC: 9 10*3/uL (ref 4.0–10.5)
nRBC: 0 % (ref 0.0–0.2)

## 2021-04-21 LAB — COMPREHENSIVE METABOLIC PANEL
ALT: 15 U/L (ref 0–44)
AST: 16 U/L (ref 15–41)
Albumin: 4.1 g/dL (ref 3.5–5.0)
Alkaline Phosphatase: 68 U/L (ref 38–126)
Anion gap: 9 (ref 5–15)
BUN: 11 mg/dL (ref 6–20)
CO2: 23 mmol/L (ref 22–32)
Calcium: 9.3 mg/dL (ref 8.9–10.3)
Chloride: 105 mmol/L (ref 98–111)
Creatinine, Ser: 0.72 mg/dL (ref 0.44–1.00)
GFR, Estimated: >60 mL/min (ref >60)
Glucose, Bld: 97 mg/dL (ref 70–99)
Potassium: 3.8 mmol/L (ref 3.5–5.1)
Sodium: 137 mmol/L (ref 135–145)
Total Bilirubin: 0.2 mg/dL — ABNORMAL LOW (ref 0.3–1.2)
Total Protein: 7.2 g/dL (ref 6.5–8.1)

## 2021-04-21 LAB — POC SARS CORONAVIRUS 2 AG: SARSCOV2ONAVIRUS 2 AG: NEGATIVE

## 2021-04-21 LAB — POCT PREGNANCY, URINE: Preg Test, Ur: NEGATIVE

## 2021-04-21 LAB — RESP PANEL BY RT-PCR (FLU A&B, COVID) ARPGX2
Influenza A by PCR: NEGATIVE
Influenza B by PCR: NEGATIVE
SARS Coronavirus 2 by RT PCR: NEGATIVE

## 2021-04-21 LAB — POC SARS CORONAVIRUS 2 AG -  ED: SARS Coronavirus 2 Ag: NEGATIVE

## 2021-04-21 MED ORDER — MELATONIN 3 MG PO TABS
3.0000 mg | ORAL_TABLET | Freq: Every day | ORAL | Status: DC
  Administered 2021-04-21: 3 mg via ORAL
  Filled 2021-04-21 (×4): qty 1

## 2021-04-21 MED ORDER — PANTOPRAZOLE SODIUM 40 MG PO TBEC
40.0000 mg | DELAYED_RELEASE_TABLET | Freq: Every day | ORAL | Status: DC
  Administered 2021-04-21: 40 mg via ORAL
  Filled 2021-04-21: qty 1

## 2021-04-21 MED ORDER — MAGNESIUM HYDROXIDE 400 MG/5ML PO SUSP: 30.0000 mL | Freq: Every day | ORAL | Status: DC | PRN

## 2021-04-21 MED ORDER — HYDROXYZINE HCL 25 MG PO TABS
25.0000 mg | ORAL_TABLET | Freq: Three times a day (TID) | ORAL | Status: DC | PRN
Start: 2021-04-21 — End: 2021-04-21
  Administered 2021-04-21 (×2): 25 mg via ORAL
  Filled 2021-04-21 (×2): qty 1

## 2021-04-21 MED ORDER — HYDROXYZINE HCL 10 MG PO TABS
10.0000 mg | ORAL_TABLET | Freq: Three times a day (TID) | ORAL | Status: DC | PRN
Start: 2021-04-21 — End: 2021-04-22
  Administered 2021-04-21 – 2021-04-22 (×2): 10 mg via ORAL
  Filled 2021-04-21 (×2): qty 1

## 2021-04-21 MED ORDER — ACETAMINOPHEN 325 MG PO TABS
650.0000 mg | ORAL_TABLET | Freq: Four times a day (QID) | ORAL | Status: DC | PRN
  Administered 2021-04-25: 650 mg via ORAL
  Filled 2021-04-21: qty 2

## 2021-04-21 MED ORDER — ALUM & MAG HYDROXIDE-SIMETH 200-200-20 MG/5ML PO SUSP: 30.0000 mL | ORAL | Status: DC | PRN

## 2021-04-21 MED ORDER — SERTRALINE HCL 50 MG PO TABS
150.0000 mg | ORAL_TABLET | Freq: Every day | ORAL | Status: DC
  Administered 2021-04-22: 150 mg via ORAL
  Filled 2021-04-21 (×4): qty 1

## 2021-04-21 MED ORDER — ONDANSETRON HCL 4 MG PO TABS
4.0000 mg | ORAL_TABLET | Freq: Four times a day (QID) | ORAL | Status: DC
  Administered 2021-04-21 (×2): 4 mg via ORAL
  Filled 2021-04-21 (×2): qty 1

## 2021-04-21 MED ORDER — MELATONIN 3 MG PO TABS
3.0000 mg | ORAL_TABLET | Freq: Every day | ORAL | Status: DC
  Administered 2021-04-21: 3 mg via ORAL
  Filled 2021-04-21: qty 1

## 2021-04-21 MED ORDER — TRAZODONE HCL 50 MG PO TABS
50.0000 mg | ORAL_TABLET | Freq: Every evening | ORAL | Status: DC | PRN
  Administered 2021-04-22 – 2021-04-23 (×2): 50 mg via ORAL
  Filled 2021-04-21 (×2): qty 1

## 2021-04-21 MED ORDER — TRAZODONE HCL 50 MG PO TABS: 50.0000 mg | ORAL_TABLET | Freq: Every evening | ORAL | Status: DC | PRN

## 2021-04-21 MED ORDER — SERTRALINE HCL 50 MG PO TABS
150.0000 mg | ORAL_TABLET | Freq: Every day | ORAL | Status: DC
  Administered 2021-04-21: 150 mg via ORAL
  Filled 2021-04-21: qty 1

## 2021-04-21 MED ORDER — ACETAMINOPHEN 325 MG PO TABS: 650.0000 mg | ORAL_TABLET | Freq: Four times a day (QID) | ORAL | Status: DC | PRN

## 2021-04-21 MED ORDER — RISPERIDONE 3 MG PO TABS
3.0000 mg | ORAL_TABLET | Freq: Every day | ORAL | Status: DC
  Administered 2021-04-21: 3 mg via ORAL
  Filled 2021-04-21: qty 1

## 2021-04-21 MED ORDER — MAGNESIUM HYDROXIDE 400 MG/5ML PO SUSP
30.0000 mL | Freq: Every day | ORAL | Status: DC | PRN
  Administered 2021-04-22 – 2021-04-25 (×2): 30 mL via ORAL
  Filled 2021-04-21 (×3): qty 30

## 2021-04-21 MED ORDER — PANTOPRAZOLE SODIUM 40 MG PO TBEC
40.0000 mg | DELAYED_RELEASE_TABLET | Freq: Every day | ORAL | Status: DC
  Administered 2021-04-22 – 2021-04-26 (×5): 40 mg via ORAL
  Filled 2021-04-21 (×7): qty 1

## 2021-04-21 MED ORDER — RISPERIDONE 3 MG PO TABS
3.0000 mg | ORAL_TABLET | Freq: Every day | ORAL | Status: DC
  Administered 2021-04-21: 3 mg via ORAL
  Filled 2021-04-21 (×4): qty 1

## 2021-04-21 MED ORDER — LORAZEPAM 1 MG PO TABS
1.0000 mg | ORAL_TABLET | Freq: Once | ORAL | Status: AC
  Administered 2021-04-21: 1 mg via ORAL
  Filled 2021-04-21: qty 1
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
## 2021-04-21 NOTE — Tx Team (Signed)
Initial Treatment Plan ?04/21/2021 ?6:57 PM ?[name] ?CSP:[id number] ? ? ? ?PATIENT STRESSORS: ?Marital or family conflict   ?Traumatic event   ? ? ?PATIENT STRENGTHS: ?Other: none indicated by patient ? ? ?PATIENT IDENTIFIED PROBLEMS: ?Depression  ?Anger  ?Anxiety   ?sadness  ?Paranoia   ?Suspiciousness.  ?  ?  ?  ?  ? ?DISCHARGE CRITERIA:  ?None at time of admission  ? ?PRELIMINARY DISCHARGE PLAN: ?Return to previous living arrangement ? ?PATIENT/FAMILY INVOLVEMENT: ?This treatment plan has been presented to and reviewed with the patient, [name], and/or family member.  The patient and family have been given the opportunity to ask questions and make suggestions. ? ?Dorris Carnes, RN ?04/21/2021, 6:57 PM ?

## 2021-04-21 NOTE — ED Notes (Signed)
Safe transport called to take pt to [facility].   ?

## 2021-04-21 NOTE — ED Notes (Signed)
Pt is currently sleeping, no distress noted, environmental check complete, will continue to monitor patient for safety. ? ?

## 2021-04-21 NOTE — ED Notes (Signed)
Pt was given oatmeal for breakfast. ?

## 2021-04-21 NOTE — ED Notes (Signed)
Pt left via safe transport. Belongings obtained from locker and given to safe transport driver.  Safety maintiained ?
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
## 2021-04-21 NOTE — Progress Notes (Signed)
[facility]/BM[facility] LCSW Progress Note ?  ?04/21/2021    3:32 PM ? ?Inaaya Vellucci  ? ?[id number]  ? ?Type of Contact and Topic:  Psychiatric Bed Placement  ? ?Pt accepted to [facility]   ? ?Patient meets inpatient criteria per Madilyn Fireman, NP ? ?The attending provider will be Viann Fish, MD  ? ?Call report to [phone]   ? ?Roanna Banning, LPN @ [facility] notified.    ? ?Pt scheduled  to arrive at [place] 5 PM.  ? ? ?Mariea Clonts, MSW, LCSW-A  ?3:33 PM 04/21/2021   ? ?  ?  ? ? ? ? ?  ?

## 2021-04-21 NOTE — Progress Notes (Signed)
?  Pt presents with increased anxiety and depression 10/10.  Pt reports she has social anxiety and unable to sit in a large group setting.  Pt reports she cannot get a job and she needs it to take care of her little girl. Pt endorses Passive SI, but verbally contracts for safety.  Pt denies HI, and AVH. ? ?Medications administered. Provided reassurance to the patient. ? ?Pt is safe on the unit with Q 15 minute safety checks. ? ? ? 04/21/21 2156  ?Psych Admission Type (Psych Patients Only)  ?Admission Status Voluntary  ?Psychosocial Assessment  ?Patient Complaints Anxiety;Self-harm thoughts  ?Eye Contact Fair  ?Facial Expression Flat  ?Affect Appropriate to circumstance  ?Speech Rapid;Pressured  ?Interaction Assertive  ?Motor Activity Slow  ?Appearance/Hygiene Unremarkable  ?Behavior Characteristics Cooperative;Anxious  ?Mood Depressed;Anxious  ?Thought Process  ?Coherency WDL  ?Content Paranoia  ?Delusions Paranoid  ?Perception WDL  ?Hallucination None reported or observed  ?Judgment Poor  ?Confusion None  ?Danger to Self  ?Current suicidal ideation? Passive  ?Self-Injurious Behavior No self-injurious ideation or behavior indicators observed or expressed   ?Agreement Not to Harm Self Yes  ?Description of Agreement verbally contracted for safety  ?Danger to Others  ?Danger to Others None reported or observed  ? ? ?

## 2021-04-21 NOTE — ED Notes (Signed)
Pt was given hot meal and cookie for lunch. ?

## 2021-04-21 NOTE — ED Notes (Signed)
Pt has been brought on unit, familiarized with unit and hot meal has been given ?

## 2021-04-21 NOTE — ED Notes (Signed)
Pt resting with eyes closed.  Spoke briefly with this Probation officer before turning over and returning to sleep.  Pt denies AVH and HI at this time.  Pt reports suicidal thoughts but would not elaborate on a plan or method.  Breathing is even and unlabored.  No pain reported.  Will continue to monitor for safety. ?

## 2021-04-21 NOTE — ED Notes (Signed)
Pt is awake and alert

## 2021-04-22 DIAGNOSIS — F22 Delusional disorders: Secondary | ICD-10-CM

## 2021-04-22 DIAGNOSIS — F411 Generalized anxiety disorder: Secondary | ICD-10-CM

## 2021-04-22 HISTORY — DX: Delusional disorders: F22

## 2021-04-22 LAB — URINALYSIS, ROUTINE W REFLEX MICROSCOPIC
Bilirubin Urine: NEGATIVE
Glucose, UA: NEGATIVE mg/dL
Hgb urine dipstick: NEGATIVE
Ketones, ur: NEGATIVE mg/dL
Leukocytes,Ua: NEGATIVE
Nitrite: NEGATIVE
Protein, ur: NEGATIVE mg/dL
Specific Gravity, Urine: 1.016 (ref 1.005–1.030)
pH: 7 (ref 5.0–8.0)

## 2021-04-22 MED ORDER — MELATONIN 5 MG PO TABS
5.0000 mg | ORAL_TABLET | Freq: Every day | ORAL | Status: DC
  Administered 2021-04-22 – 2021-04-25 (×4): 5 mg via ORAL
  Filled 2021-04-22 (×5): qty 1

## 2021-04-22 MED ORDER — WHITE PETROLATUM EX OINT
TOPICAL_OINTMENT | CUTANEOUS | Status: AC
  Filled 2021-04-22: qty 5

## 2021-04-22 MED ORDER — GABAPENTIN 100 MG PO CAPS
100.0000 mg | ORAL_CAPSULE | Freq: Three times a day (TID) | ORAL | Status: DC
  Administered 2021-04-22 – 2021-04-23 (×3): 100 mg via ORAL
  Filled 2021-04-22 (×11): qty 1

## 2021-04-22 MED ORDER — RISPERIDONE 0.5 MG PO TABS
0.5000 mg | ORAL_TABLET | Freq: Once | ORAL | Status: AC
  Administered 2021-04-24: 0.5 mg via ORAL
  Filled 2021-04-22: qty 1

## 2021-04-22 MED ORDER — CARIPRAZINE HCL 1.5 MG PO CAPS
1.5000 mg | ORAL_CAPSULE | Freq: Every day | ORAL | Status: DC
Start: 2021-04-22 — End: 2021-04-25
  Administered 2021-04-22 – 2021-04-25 (×4): 1.5 mg via ORAL
  Filled 2021-04-22 (×6): qty 1

## 2021-04-22 MED ORDER — SERTRALINE HCL 25 MG PO TABS
75.0000 mg | ORAL_TABLET | Freq: Once | ORAL | Status: AC
  Administered 2021-04-23: 75 mg via ORAL
  Filled 2021-04-22: qty 3

## 2021-04-22 MED ORDER — BENZOCAINE 10 % MT GEL
Freq: Three times a day (TID) | OROMUCOSAL | Status: DC | PRN
  Administered 2021-04-22 – 2021-04-25 (×2): 1 via OROMUCOSAL
  Filled 2021-04-22: qty 9

## 2021-04-22 MED ORDER — ESCITALOPRAM OXALATE 5 MG PO TABS
5.0000 mg | ORAL_TABLET | Freq: Once | ORAL | Status: AC
  Administered 2021-04-23: 5 mg via ORAL
  Filled 2021-04-22: qty 1

## 2021-04-22 MED ORDER — GABAPENTIN 100 MG PO CAPS: 100.0000 mg | ORAL_CAPSULE | Freq: Three times a day (TID) | ORAL | Status: DC

## 2021-04-22 MED ORDER — SERTRALINE HCL 50 MG PO TABS
50.0000 mg | ORAL_TABLET | Freq: Once | ORAL | Status: AC
  Administered 2021-04-24: 50 mg via ORAL
  Filled 2021-04-22: qty 1

## 2021-04-22 MED ORDER — RISPERIDONE 1 MG PO TABS
1.0000 mg | ORAL_TABLET | Freq: Once | ORAL | Status: AC
  Administered 2021-04-23: 1 mg via ORAL
  Filled 2021-04-22: qty 1

## 2021-04-22 MED ORDER — HYDROXYZINE HCL 25 MG PO TABS
25.0000 mg | ORAL_TABLET | Freq: Three times a day (TID) | ORAL | Status: DC | PRN
  Administered 2021-04-22 – 2021-04-24 (×3): 25 mg via ORAL
  Filled 2021-04-22 (×4): qty 1

## 2021-04-22 MED ORDER — RISPERIDONE 0.5 MG PO TABS
1.5000 mg | ORAL_TABLET | Freq: Once | ORAL | Status: AC
  Administered 2021-04-22: 1.5 mg via ORAL
  Filled 2021-04-22 (×2): qty 3

## 2021-04-22 MED ORDER — ESCITALOPRAM OXALATE 10 MG PO TABS
10.0000 mg | ORAL_TABLET | Freq: Every day | ORAL | Status: DC
  Administered 2021-04-24 – 2021-04-26 (×3): 10 mg via ORAL
  Filled 2021-04-22 (×4): qty 1

## 2021-04-22 MED ORDER — IBUPROFEN 400 MG PO TABS
400.0000 mg | ORAL_TABLET | Freq: Four times a day (QID) | ORAL | Status: DC | PRN
  Administered 2021-04-22 – 2021-04-26 (×5): 400 mg via ORAL
  Filled 2021-04-22 (×5): qty 1

## 2021-04-22 NOTE — Progress Notes (Signed)
D. Pt presents with an anxious affect/mood- pressured speech, rated her depression, hopelessness and anxiety an 09/14/08, respectively. Pt stated that her goal was "to talk to the doctor and hopefully find meds I can switch and that will be more helpful." Pt endorsed passive SI, agreed to contact staff before acting on any harmful thoughts.  ?A. Labs and vitals monitored. Pt given and educated on medications. Pt supported emotionally and encouraged to express concerns and ask questions.   ?R. Pt remains safe with 15 minute checks. Will continue POC. ? ?  ?

## 2021-04-22 NOTE — [facility] Suicide Risk Assessment (Signed)
Suicide Risk Assessment ? ?Admission Assessment    ?[facility] Admission Suicide Risk Assessment ? ? ?Nursing information obtained from:  Patient ?Demographic factors:  Caucasian ?Current Mental Status:  Self-harm thoughts ?Loss Factors:  low socio economic status ?Historical Factors:  Prior suicide attempts ?Risk Reduction Factors:  Positive social support ? ?Total Time spent with patient: 1 hour ?Principal Problem: Schizoaffective disorder ([name]) ?Diagnosis:  Principal Problem: ?  Schizoaffective disorder ([name]) ?Active Problems: ?  Autism ?  Suicidal ideation ?  GAD (generalized anxiety disorder) ?  Paranoia ([name]) ? ?History of Present Illness: [name]. [name] is a 31 y.o Caucasian female with past mental health diagnoses of  MDD, bipolar d/o, schizoaffective d/o, GAD & autism who presented to the [facility] with paranoia & suicidal ideations with a plan to use a knife or take pills to kill herself. Pt's husband completed suicide in March 2019, and pt is paranoid that his brother is out to get her. As per chart review, pt has had multiple visits to the [facility] health urgent care [facility] and to this [facility]. Pt was transferred to this [place] for treatment and stabilization of her mood. ? ?Continued Clinical Symptoms: She continues to report being suicidal, denies having a current plan, & verbally contracts for safety on the unit. She reports worsening paranoia, social anxiety, feelings of worthlessness, hopelessness, helplessness, irritability, insomnia, and also reports low energy levels for the past couple of months. ?  ?The "Alcohol Use Disorders Identification Test", Guidelines for Use in Primary Care, Second Edition.  World Pharmacologist [facility]). ?Score between 0-7:  no or low risk or alcohol related problems. ?Score between 8-15:  moderate risk of alcohol related problems. ?Score between 16-19:  high risk of alcohol related problems. ?Score 20 or above:  warrants further diagnostic evaluation for  alcohol dependence and treatment. ? ?CLINICAL FACTORS:  ? Depression:   Anhedonia ?Hopelessness ?Impulsivity ?Insomnia ? ?Musculoskeletal: ?Strength & Muscle Tone: within normal limits ?Gait & Station: normal ?Patient leans: N/A ? ?Psychiatric Specialty Exam: ? ?Presentation  ?General Appearance: Disheveled ? ?Eye Contact:Fair ? ?Speech:Clear and Coherent ? ?Speech Volume:Normal ? ?Handedness:Right ? ?Mood and Affect  ?Mood:Anxious; Depressed ? ?Affect:Congruent ? ?Thought Process  ?Thought Processes:Coherent ? ?Descriptions of Associations:Intact ? ?Orientation:Full (Time, Place and Person) ? ?Thought Content:Logical ? ?History of Schizophrenia/Schizoaffective disorder:Yes ? ?Duration of Psychotic Symptoms:Greater than six months ? ?Hallucinations:Hallucinations: None ? ?Ideas of Reference:Paranoia ? ?Suicidal Thoughts:Suicidal Thoughts: Yes, Active ?SI Active Intent and/or Plan: Without Intent; Without Plan ? ?Homicidal Thoughts:Homicidal Thoughts: No ? ?Sensorium  ?Memory:Immediate Good ? ?Judgment:Poor ? ?Insight:Poor ? ?Executive Functions  ?Concentration:Fair ? ?Attention Span:Fair ? ?Recall:Fair ? ?[place] ? ?Language:Fair ? ?Psychomotor Activity  ?Psychomotor Activity:Psychomotor Activity: Normal ? ?Assets  ?Assets:Communication Skills ? ?Sleep  ?Sleep:Sleep: Fair ? ?Physical Exam: ?Physical Exam ?Constitutional:   ?   Appearance: She is obese.  ?HENT:  ?   Head: Normocephalic.  ?   Nose: Nose normal. No congestion.  ?Eyes:  ?   Pupils: Pupils are equal, round, and reactive to light.  ?Pulmonary:  ?   Effort: Pulmonary effort is normal.  ?Musculoskeletal:     ?   General: Normal range of motion.  ?   Cervical back: Normal range of motion. No rigidity.  ?Neurological:  ?   General: No focal deficit present.  ?   Mental Status: She is alert and oriented to person, place, and time.  ? ?Review of Systems  ?Constitutional: Negative.   ?HENT:  Negative.    ?Eyes: Negative.   ?Respiratory: Negative.     ?Gastrointestinal: Negative.   ?Genitourinary: Negative.   ?Musculoskeletal: Negative.   ?Skin: Negative.   ?Neurological: Negative.   ?Psychiatric/Behavioral:  Positive for depression and suicidal ideas. The patient is nervous/anxious and has insomnia.   ?Blood pressure (!) 111/52, pulse 98, temperature 97.9 ?F (36.6 ?C), temperature source Oral, resp. rate 20, height '5\' 4"'$  (1.626 m), weight 98.9 kg, SpO2 99 %. Body mass index is 37.42 kg/m?. ? ? ?COGNITIVE FEATURES THAT CONTRIBUTE TO RISK:  ?None   ? ?SUICIDE RISK:  ? Moderate:  Frequent suicidal ideation with limited intensity, and duration, some specificity in terms of plans, no associated intent, good self-control, limited dysphoria/symptomatology, some risk factors present, and identifiable protective factors, including available and accessible social support. ? ?PLAN OF CARE:  ?PLAN ?Safety and Monitoring: ?Voluntary admission to inpatient psychiatric unit for safety, stabilization and treatment ?Daily contact with patient to assess and evaluate symptoms and progress in treatment ?Patient's case to be discussed in multi-disciplinary team meeting ?Observation Level : q15 minute checks ?Vital signs: q12 hours ?Precautions: suicide, elopement, and assault ?  ?Medications ?Schizoaffective Disorder ?-Start Vraylar 1.5 mg daily for psychosis ?-Start Lexapro (5 mg on 3/18 & 10 mg thereafter) ?-Taper off Zoloft ('75mg'$  on 3/18, 50 mg on 3/19, then stop) ?-Taper off Risperdal (1.5 mg tonight, '1mg'$  on 3/19, 0.5 mg on 3/10, then stop) ?  ?Anxiety ?-Continue Hydroxyzine 25 mg every 6 hours PRN ?Start Gabapentin 100 mg TID ?  ?Insomnia ?Continue Trazodone 50 mg nightly PRN ?Increase Melatonin to 5 mg nightly  ?  ?Other PRNS ?-Continue Tylenol 650 mg every 6 hours PRN for mild pain ?-Continue Maalox 30 mg every 4 hrs PRN for indigestion ?-Continue Milk of Magnesia as needed every 6 hrs for constipation ?-Continue Orajel PRN for toothache ?-Start Agitation protocol PRN  (Please see MAR) ?  ?Long Term Goal(s): Improvement in symptoms so as ready for discharge ?  ?Short Term Goals: Ability to identify changes in lifestyle to reduce recurrence of condition will improve, Ability to verbalize feelings will improve, Ability to disclose and discuss suicidal ideas, Ability to identify and develop effective coping behaviors will improve, and Compliance with prescribed medications will improve ?  ?Physician Treatment Plan for Secondary Diagnosis:  ?Principal Problem: ?  Schizoaffective disorder ([place]) ?Active Problems: ?  Autism ?  Suicidal ideation ?  GAD (generalized anxiety disorder) ?  Paranoia ([place]) ?  ?Discharge Planning: ?Social work and case management to assist with discharge planning and identification of hospital follow-up needs prior to discharge ?Estimated LOS: 5-7 days ?Discharge Concerns: Need to establish a safety plan; Medication compliance and effectiveness ?Discharge Goals: Return home with outpatient referrals for mental health follow-up including medication management/psychotherapy ? ?I certify that inpatient services furnished can reasonably be expected to improve the patient's condition.  ? ?Nicholes Rough, NP ?04/22/2021, 5:31 PM ?

## 2021-04-22 NOTE — Plan of Care (Signed)
?  Problem: Education: Goal: Emotional status will improve Outcome: Not Progressing Goal: Mental status will improve Outcome: Not Progressing   Problem: Activity: Goal: Interest or engagement in activities will improve Outcome: Not Progressing   

## 2021-04-22 NOTE — Progress Notes (Signed)
?   04/22/21 2242  ?Psych Admission Type (Psych Patients Only)  ?Admission Status Voluntary  ?Psychosocial Assessment  ?Patient Complaints Anxiety;Agitation  ?Eye Contact Fair  ?Facial Expression Anxious  ?Affect Anxious  ?Speech Pressured;Logical/coherent  ?Interaction Assertive  ?Motor Activity Restless  ?Appearance/Hygiene Unremarkable  ?Behavior Characteristics Anxious;Fidgety  ?Mood Anxious  ?Thought Process  ?Coherency WDL  ?Content Paranoia  ?Delusions Paranoid  ?Perception WDL  ?Hallucination None reported or observed  ?Judgment Poor  ?Confusion None  ?Danger to Self  ?Current suicidal ideation? Passive  ?Self-Injurious Behavior No self-injurious ideation or behavior indicators observed or expressed   ?Agreement Not to Harm Self Yes  ?Description of Agreement verbal  ?Danger to Others  ?Danger to Others None reported or observed  ? ? ?

## 2021-04-22 NOTE — H&P (Addendum)
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 outpatient referrals for mental health follow-up including medication management/psychotherapy  I certify that inpatient services furnished can reasonably be expected to improve the patient's condition.    Starleen Blue, NP 3/18/20235:27 PM

## 2021-04-22 NOTE — Progress Notes (Signed)
Adult Psychoeducational Group Note ? ?Date:  04/22/2021 ?Time:  9:18 PM ? ?Group Topic/Focus:  ?Wrap-Up Group:   The focus of this group is to help patients review their daily goal of treatment and discuss progress on daily workbooks. ? ?Participation Level:  Minimal ? ?Participation Quality:  Appropriate ? ?Affect:  Flat ? ?Cognitive:  Oriented ? ?Insight: Appropriate and Limited ? ?Engagement in Group:  Engaged ? ?Modes of Intervention:  Education and Exploration ? ?Additional Comments:  Patient attended and participated in group tonight. She reports that today she learn about new medications she will be trying. ? ?Debe Coder ?04/22/2021, 9:18 PM ?
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
## 2021-04-23 MED ORDER — ONDANSETRON 4 MG PO TBDP
4.0000 mg | ORAL_TABLET | Freq: Three times a day (TID) | ORAL | Status: DC | PRN
  Administered 2021-04-24: 4 mg via ORAL
  Filled 2021-04-23: qty 1

## 2021-04-23 MED ORDER — GABAPENTIN 100 MG PO CAPS
200.0000 mg | ORAL_CAPSULE | Freq: Three times a day (TID) | ORAL | Status: DC
  Administered 2021-04-23 – 2021-04-26 (×9): 200 mg via ORAL
  Filled 2021-04-23 (×13): qty 2

## 2021-04-23 MED ORDER — BISACODYL 5 MG PO TBEC
5.0000 mg | DELAYED_RELEASE_TABLET | Freq: Every day | ORAL | Status: DC | PRN
  Administered 2021-04-23: 5 mg via ORAL
  Filled 2021-04-23 (×2): qty 1

## 2021-04-23 MED ORDER — POLYETHYLENE GLYCOL 3350 17 G PO PACK
17.0000 g | PACK | Freq: Every day | ORAL | Status: DC
  Administered 2021-04-23 – 2021-04-24 (×2): 17 g via ORAL
  Filled 2021-04-23 (×5): qty 1

## 2021-04-23 MED ORDER — DICLOFENAC SODIUM 1 % EX GEL
2.0000 g | Freq: Two times a day (BID) | CUTANEOUS | Status: DC
  Administered 2021-04-23 – 2021-04-25 (×5): 2 g via TOPICAL
  Filled 2021-04-23 (×2): qty 100

## 2021-04-23 NOTE — Group Note (Signed)
[facility] LCSW Group Therapy Note ? ?Date/Time:  04/23/2021  10:00AM-11:00AM ? ?Type of Therapy and Topic:  Group Therapy:  Music's Effect on Depression and Anxiety ? ?Participation Level:  Active  ? ?Description of Group: ?In this process group, members discussed what types of music trigger them to worsening mental health symptoms and what they can do about this in the future.  For instance, we discussed what to do when riding in a friend's car and a song harmful to the patient starts playing.  We also discussed how music can be used as a tool to help with wellness and recovery in various ways including managing depression and anxiety as well as encouraging healthy sleep habits and avoiding relapse into old negative behaviors such as drinking, self-harming, or staying in bed all day.  A variety of songs were played as examples.  Discussion was elicited which showed the group to be in agreement that the songs were quite relatable and have the potential to help them outside of the hospital setting. ? ?Therapeutic Goals: ?Patients will be introduced to a variety of songs that can relate to self-image and self-love in a helpful way. ?Patients will explore the impact of different pieces of music on their feelings, i.e. uplifting, triggering, etc. ?Patients will discuss how to use this self-knowledge to assist in recovery. ? ?Summary of Patient Progress:  At the beginning of group, patient expressed that sad music is triggering for her, while Darrick Meigs music is uplifting.  She said she is very offended by cursing in music.  Prior to just one song, CSW warned her of one curse word that was going to be heard.  When this occurred she acted extremely shocked, but when no attention was paid to this, she stopped almost immediately.  At the end of group patient reported she felt better from all the music that was played. ? ?Therapeutic Modalities: ?Solution Focused Brief Therapy ?Activity ? ? ?Selmer Dominion, LCSW ? ? ? ?

## 2021-04-23 NOTE — Progress Notes (Addendum)
[facility] MD Progress Note ? ?04/23/2021 1:32 PM ?Nisreen Guise  ?MRN:  [id number] ? ? ?Subjective:  [name] reports: "I am still depressed and suicidal. I heard voices last night calling my names, but I don't hear any now." ? ?Today's assessment (04/23/2021): Today, pt is seen in her room on the 300 hall. She was lying in bed sleeping, but easy to arouse and engage in conversation. Pt with flat affect and depressed mood, attention to personal hygiene and grooming is fair, eye contact is good, speech is clear & coherent. Thought contents are organized and logical. Pt reports +SI, denies having a plan, verbally contracts for safety on the unit. Pt reports having +AH last night of a voice calling her name. She denies VH/HI, and currently denies paranoia. She states that she no longer feels like her deceased husband's brother is out to get her.  ? ?Pt reports a poor appetite, states that she feels constipated and nauseous.  Miralax ordered and given to pt. Abdomen is soft, non tender and non distended. She denies abdominal pain, but is very somatic. Pt requesting Zofran for nausea, and order placed. Pt complaining of back pain of 8 (10 being worst), and requesting for Gabapentin to be increased to 200 mg TID for management of anxiety and pain. She reports a good sleep quality last night, states that she is tolerating taking the Vraylar, and denies any other concerns or medication related side effects. No tremors observed or reported and AIMS-0. Will continue to taper off Zoloft and increase Lexapro for management of depressive symptoms as follows. Will also continue to taper off Risperdal as below since Vraylar has been started. Pt requesting Voltaren for arthritis. Will order this as well. ?  ?Principal Problem: Schizoaffective disorder ([place]) ?Diagnosis: Principal Problem: ?  Schizoaffective disorder ([place]) ?Active Problems: ?  Autism ?  Suicidal ideation ?  GAD (generalized anxiety disorder) ?  Paranoia ([place]) ? ?Total  Time spent with patient: 20 minutes ? ?Past Psychiatric History: As above ? ?Past Medical History:  ?Past Medical History:  ?Diagnosis Date  ? Acid reflux   ? Anxiety   ? Asthma   ? last attack 03/13/15 or 03/14/15  ? Autism   ? Carrier of fragile X syndrome   ? Chronic constipation   ? Depression   ? Drug-seeking behavior   ? Essential tremor   ? Headache   ? Overdose of acetaminophen 07/2017  ? and other meds  ? Personality disorder ([place])   ? Schizo-affective psychosis ([place])   ? Schizoaffective disorder, bipolar type ([place])   ? Seizures ([place])   ? Last seizure December 2017  ? Sleep apnea   ?  ?Past Surgical History:  ?Procedure Laterality Date  ? MOUTH SURGERY  2009 or 2010  ? ?Family History:  ?Family History  ?Problem Relation Age of Onset  ? Mental illness Father   ? Asthma Father   ? PDD Brother   ? Seizures Brother   ? ?Family Psychiatric  History: As above ?Social History:  ?Social History  ? ?Substance and Sexual Activity  ?Alcohol Use No  ? Alcohol/week: 1.0 standard drink  ? Types: 1 Standard drinks or equivalent per week  ? Comment: denies at this time  ?   ?Social History  ? ?Substance and Sexual Activity  ?Drug Use No  ? Comment: History of cocaine use at age 80 for 4 months  ?  ?Social History  ? ?Socioeconomic History  ? Marital status: Widowed  ?  Spouse name: Not on file  ? Number of children: 0  ? Years of education: Not on file  ? Highest education level: Not on file  ?Occupational History  ? Occupation: disability  ?Tobacco Use  ? Smoking status: Former  ?  Packs/day: 0.00  ?  Types: Cigarettes  ? Smokeless tobacco: Never  ? Tobacco comments:  ?  Smoked for 2  years age [age]  ?Vaping Use  ? Vaping Use: Never used  ?Substance and Sexual Activity  ? Alcohol use: No  ?  Alcohol/week: 1.0 standard drink  ?  Types: 1 Standard drinks or equivalent per week  ?  Comment: denies at this time  ? Drug use: No  ?  Comment: History of cocaine use at age 76 for 4 months  ? Sexual activity: Yes  ?  Birth  control/protection: None  ?Other Topics Concern  ? Not on file  ?Social History Narrative  ? Marital status: Widowed  ?    Children: daughter  ?    Lives: with boyfriend, in two story home  ?    Employment:  Disability  ?    Tobacco: quit smoking; smoked for two years.  ?    Alcohol ;none  ?    Drugs: none  ? Has not traveled outside of the country.  ? Right handed   ?     ? ?Social Determinants of Health  ? ?Financial Resource Strain: Not on file  ?Food Insecurity: Not on file  ?Transportation Needs: Not on file  ?Physical Activity: Not on file  ?Stress: Not on file  ?Social Connections: Not on file  ? ?Sleep: Good ? ?Appetite:  Poor ? ?Current Medications: ?Current Facility-Administered Medications  ?Medication Dose Route Frequency Provider Last Rate Last Admin  ? acetaminophen (TYLENOL) tablet 650 mg  650 mg Oral Q6H PRN Tharon Aquas, NP      ? alum & mag hydroxide-simeth (MAALOX/MYLANTA) 200-200-20 MG/5ML suspension 30 mL  30 mL Oral Q4H PRN Tharon Aquas, NP      ? benzocaine (ORAJEL) 10 % mucosal gel   Mouth/Throat TID PRN Harlow Asa, MD   1 application. at 04/22/21 1457  ? bisacodyl (DULCOLAX) EC tablet 5 mg  5 mg Oral Daily PRN Nicholes Rough, NP      ? cariprazine (VRAYLAR) capsule 1.5 mg  1.5 mg Oral Daily Nkwenti, Doris, NP   1.5 mg at 04/23/21 1610  ? diclofenac Sodium (VOLTAREN) 1 % topical gel 2 g  2 g Topical BID Nicholes Rough, NP      ? Derrill Memo ON 04/24/2021] escitalopram (LEXAPRO) tablet 10 mg  10 mg Oral Daily Nkwenti, Doris, NP      ? gabapentin (NEURONTIN) capsule 200 mg  200 mg Oral TID Nicholes Rough, NP      ? hydrOXYzine (ATARAX) tablet 25 mg  25 mg Oral TID PRN Nicholes Rough, NP   25 mg at 04/22/21 2003  ? ibuprofen (ADVIL) tablet 400 mg  400 mg Oral Q6H PRN Nicholes Rough, NP   400 mg at 04/22/21 2056  ? magnesium hydroxide (MILK OF MAGNESIA) suspension 30 mL  30 mL Oral Daily PRN Tharon Aquas, NP   30 mL at 04/22/21 2057  ? melatonin tablet 5 mg  5 mg Oral QHS  Nicholes Rough, NP   5 mg at 04/22/21 2056  ? ondansetron (ZOFRAN-ODT) disintegrating tablet 4 mg  4 mg Oral Q8H PRN Nicholes Rough, NP      ?  pantoprazole (PROTONIX) EC tablet 40 mg  40 mg Oral Daily Tharon Aquas, NP   40 mg at 04/23/21 9735  ? polyethylene glycol (MIRALAX / GLYCOLAX) packet 17 g  17 g Oral Daily Nicholes Rough, NP   17 g at 04/23/21 1113  ? [START ON 04/24/2021] risperiDONE (RISPERDAL) tablet 0.5 mg  0.5 mg Oral Once Nicholes Rough, NP      ? risperiDONE (RISPERDAL) tablet 1 mg  1 mg Oral Once Nicholes Rough, NP      ? [START ON 04/24/2021] sertraline (ZOLOFT) tablet 50 mg  50 mg Oral Once Nicholes Rough, NP      ? traZODone (DESYREL) tablet 50 mg  50 mg Oral QHS PRN Tharon Aquas, NP   50 mg at 04/22/21 2056  ? ? ?Lab Results:  ?Results for orders placed or performed during the hospital encounter of 04/21/21 (from the past 48 hour(s))  ?Urinalysis, Routine w reflex microscopic Urine, Clean Catch     Status: Abnormal  ? Collection Time: 04/22/21  7:58 PM  ?Result Value Ref Range  ? Color, Urine YELLOW YELLOW  ? APPearance HAZY (A) CLEAR  ? Specific Gravity, Urine 1.016 1.005 - 1.030  ? pH 7.0 5.0 - 8.0  ? Glucose, UA NEGATIVE NEGATIVE mg/dL  ? Hgb urine dipstick NEGATIVE NEGATIVE  ? Bilirubin Urine NEGATIVE NEGATIVE  ? Ketones, ur NEGATIVE NEGATIVE mg/dL  ? Protein, ur NEGATIVE NEGATIVE mg/dL  ? Nitrite NEGATIVE NEGATIVE  ? Leukocytes,Ua NEGATIVE NEGATIVE  ?  Comment: Performed at [facility], [place] [address] [place], [place] [address]  ? ? ?Blood Alcohol level:  ?Lab Results  ?Component Value Date  ? ETH <10 03/02/2021  ? ETH <10 11/21/2020  ? ? ?Metabolic Disorder Labs: ?Lab Results  ?Component Value Date  ? HGBA1C 5.1 03/02/2021  ? MPG 99.67 03/02/2021  ? MPG 96.8 07/02/2019  ? ?Lab Results  ?Component Value Date  ? PROLACTIN 66.2 (H) 03/02/2021  ? PROLACTIN 66.1 (H) 02/04/2020  ? ?Lab Results  ?Component Value Date  ? CHOL 227 (H) 03/02/2021  ? TRIG 147  03/02/2021  ? HDL 45 03/02/2021  ? CHOLHDL 5.0 03/02/2021  ? VLDL 29 03/02/2021  ? LDLCALC 153 (H) 03/02/2021  ? [place] 92 01/18/2020  ? ? ?Physical Findings: ?AIMS: Facial and Oral Movements ?Muscles of Facial Expression

## 2021-04-23 NOTE — Progress Notes (Signed)
D. Pt presents with a flat affect, anxious, depressed mood- cooperative, but very somatic, with varying complaints: constipation, nausea, and knee pain.  Pt continues to complain of social anxiety, but did attend groups today. Per pt's self inventory, pt rated her depression,hopelessness and anxiety a 07/11/08, respectively. Pt reported that her goal was " to increase coming to group and learn new skills." Pt currently denies SI/HI.  ?A. Labs and vitals monitored. Pt given and educated on medications. Pt supported emotionally and encouraged to express concerns and ask questions.   ?R. Pt remains safe with 15 minute checks. Will continue POC. ? ?  ?

## 2021-04-23 NOTE — Group Note (Signed)
Date:  04/23/2021 ?Time:  11:20 AM ? ?Group Topic/Focus:  ?Goals Group:   The focus of this group is to help patients establish daily goals to achieve during treatment and discuss how the patient can incorporate goal setting into their daily lives to aide in recovery. ? ? ? ?Participation Level:  Active ? ?Participation Quality:  Appropriate ? ?Affect:  Appropriate ? ?Cognitive:  Appropriate ? ?Insight: Appropriate ? ?Engagement in Group:  Engaged ? ?Modes of Intervention:  Discussion ? ?Additional Comments:  Patient wants to attend more groups and reduce her anxiety as a goal today.Dicussed with patient that group sessions help her to learn about herself as well as serves a a sounding board for her to express herself. The more groups she attends the more her anxiety may decrease. ? ?Jerrye Beavers ?04/23/2021, 11:20 AM ? ?

## 2021-04-24 ENCOUNTER — Encounter (HOSPITAL_COMMUNITY): Payer: Self-pay

## 2021-04-24 LAB — LIPID PANEL
Cholesterol: 190 mg/dL (ref 0–200)
HDL: 44 mg/dL (ref >40)
LDL Cholesterol: 102 mg/dL — ABNORMAL HIGH (ref 0–99)
Total CHOL/HDL Ratio: 4.3 RATIO
Triglycerides: 220 mg/dL — ABNORMAL HIGH (ref <150)
VLDL: 44 mg/dL — ABNORMAL HIGH (ref 0–40)

## 2021-04-24 MED ORDER — TRAZODONE HCL 100 MG PO TABS
100.0000 mg | ORAL_TABLET | Freq: Every evening | ORAL | Status: DC | PRN
  Administered 2021-04-24: 100 mg via ORAL
  Filled 2021-04-24: qty 1

## 2021-04-24 NOTE — BHH Group Notes (Signed)
Spiritual care group on grief and loss facilitated by chaplains Amy Burris, [facility] and Janne Napoleon, [facility]  ? ?Group Goal:  ?Support / Education around grief and loss  ? ?Members engage in facilitated group support and psycho-social education.  ? ?Group Description:  ?Spiritual care group on grief and loss facilitated by chaplain Janne Napoleon, [facility]  ? ?Group Goal:  ? ?Support / Education around grief and loss  ? ?Members engage in facilitated group support and psycho-social education.  ? ?Group Description:  ? ?Following introductions and group rules, group members engaged in facilitated group dialog and support around topic of loss, with particular support around experiences of loss in their lives. Group Identified types of loss (relationships / self / things) and identified patterns, circumstances, and changes that precipitate losses. Reflected on thoughts / feelings around loss, normalized grief responses, and recognized variety in grief experience. Group noted Worden's four tasks of grief in discussion.  ? ?Group drew on Adlerian / Rogerian, narrative, MI,  ? ?Patient Progress: Pt attended group and contributed actively with several examples of grief and loss that Pt continues to cope with, including death of spouse by suicide in 05/15/2016 and loss of child. ?

## 2021-04-24 NOTE — Progress Notes (Addendum)
D:  [name] was mostly isolative to her room this evening.  She did attend AA group but left early due to not feeling well.  She was agitated and irritable at the beginning of the shift.  She stated it was due to the lack of groups on the unit due to illness break out on the unit.  She stated she just wants to be discharged because "I don't want to be here, I am not getting what I need and I need to pay bills."  She was intrusive and demanding.  She was easily redirected with verbal interaction which she required frequently.  She complained of lower back pain and ibuprofen was given with minimal relief.   ?A:  1:1 with RN for support and encouragement.  Medications given as ordered.  Q 15 minute checks maintained for safety.  Encouraged participation in group and unit activities.   ?R:  She is currently resting with her eyes closed and appears to be asleep.  She remains safe on the unit.   ? ? ? 04/24/21 2125  ?Psych Admission Type (Psych Patients Only)  ?Admission Status Voluntary  ?Psychosocial Assessment  ?Patient Complaints Agitation;Anxiety;Irritability  ?Eye Contact Brief  ?Facial Expression Angry  ?Affect Anxious;Irritable;Labile  ?Speech Argumentative  ?Interaction Childlike;Manipulative  ?Motor Activity Fidgety;Restless  ?Appearance/Hygiene Unremarkable  ?Behavior Characteristics Unwilling to participate;Anxious;Irritable  ?Mood Depressed;Anxious;Angry  ?Thought Process  ?Coherency Circumstantial  ?Content Blaming others;Preoccupation  ?Delusions None reported or observed  ?Perception Hallucinations  ?Hallucination Auditory;Visual  ?Judgment Limited  ?Confusion Mild  ?Danger to Self  ?Current suicidal ideation? Passive  ?Self-Injurious Behavior No self-injurious ideation or behavior indicators observed or expressed   ?Agreement Not to Harm Self Yes  ?Description of Agreement Verbal agreement to not harm herself  ?Danger to Others  ?Danger to Others None reported or observed  ? ? ?

## 2021-04-24 NOTE — Group Note (Signed)
LCSW Group Therapy Note ? ? ?Group Date: 04/24/2021 ?Start Time: 1300 ?End Time: 1400 ? ?Type of Therapy and Topic:  Group Therapy:  Positive Affirmations ?  ?Participation Level:  Active ? ?Description of Group: ?This group addressed positive affirmation toward self and others. Patients went around the room and identified two positive things about themselves and two positive things about a peer in the room. Patients reflected on how it felt to share something positive with others, to identify positive things about themselves, and to hear positive things from others. Patients were encouraged to have a daily reflection of positive characteristics or circumstances. ?Therapeutic Goals ?Patient will verbalize two of their positive qualities ?Patient will demonstrate empathy for others by stating two positive qualities about a peer in the group ?Patient will verbalize their feelings when voicing positive self affirmations and when voicing positive affirmations of others ?Patients will discuss the potential positive impact on their wellness/recovery of focusing on positive traits of self and others. ? ? ?Summary of Patient Progress:  Due to the spread of Illness on the 300 and 400 halls group was not held in the dayroom.  CSW met with each patient individually as needed with PPE precautions in place to reduce the spread of any further Illness.  CSW provided the patient's with worksheet packets and instructions on how to complete those packets.  CSW will follow up with patient's as needed.  ? ? ?Therapeutic Modalities ?Cognitive Behavioral Therapy ?Motivational Interviewing ? ?Darleen Crocker, LCSWA ?04/24/2021  1:47 PM   ? ?

## 2021-04-24 NOTE — Progress Notes (Signed)
CSW left HIPAA compliant message for [place], Butch Penny [phone] to discuss SPE and disposition. Awaiting call back. ? ? ?
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
## 2021-04-24 NOTE — Progress Notes (Signed)
?   04/23/21 2130  ?Psych Admission Type (Psych Patients Only)  ?Admission Status Voluntary  ?Psychosocial Assessment  ?Patient Complaints Anxiety  ?Eye Contact Fair  ?Facial Expression Anxious  ?Affect Anxious  ?Speech Logical/coherent  ?Interaction Assertive  ?Motor Activity Fidgety  ?Appearance/Hygiene Unremarkable  ?Behavior Characteristics Cooperative;Appropriate to situation  ?Mood Anxious;Pleasant  ?Thought Process  ?Coherency WDL  ?Content WDL  ?Delusions None reported or observed  ?Perception WDL  ?Hallucination None reported or observed  ?Judgment Poor  ?Confusion None  ?Danger to Self  ?Current suicidal ideation? Denies  ?Self-Injurious Behavior No self-injurious ideation or behavior indicators observed or expressed   ?Agreement Not to Harm Self Yes  ?Description of Agreement verbal  ?Danger to Others  ?Danger to Others None reported or observed  ? ? ?

## 2021-04-24 NOTE — [facility] Group Notes (Signed)
PT attended AA. PT was a little distracted but was still Was appropriate and attentive.  ?

## 2021-04-24 NOTE — Progress Notes (Signed)
Adult Psychoeducational Group Note ? ?Date:  04/24/2021 ?Time:  1:55 AM ? ?Group Topic/Focus:  ?Wrap-Up Group:   The focus of this group is to help patients review their daily goal of treatment and discuss progress on daily workbooks. ? ?Participation Level:  Active ? ?Participation Quality:  Appropriate ? ?Affect:  Appropriate ? ?Cognitive:  Appropriate ? ?Insight: Appropriate ? ?Engagement in Group:  Engaged ? ?Modes of Intervention:  Discussion ? ?Additional Comments:   ?Pt stated her goal for today was to focus on her treatment plan and to attend all groups. Pt stated she accomplished her goals today. Pt stated she talked with her doctor and with her social worker about her care today. Pt rated her overall day a 6 out of 10. Pt stated she was able to contact her friend today which improved her overall day. Pt stated she felt better about herself tonight. Pt stated she was able to attend all meals today. Pt stated she took all medications provided today. Pt stated her appetite was pretty good today. Pt rated her sleep last night was pretty good. Pt stated the goal tonight was to get some rest. Pt stated she had some physical pain tonight. Pt stated she had some severe pain in right shoulder and right and left knees. Pt rated the severe pain in her right shoulder and right and left knee a 10 on the pain level scale. Pt nurse was updated on the situation. Pt deny visual hallucinations and auditory issues tonight. Pt denies thoughts of harming herself or others. Pt stated she would alert staff if anything changed. ?Candy Sledge ?04/24/2021, 1:55 AM ?

## 2021-04-24 NOTE — BH IP Treatment Plan (Signed)
Interdisciplinary Treatment and Diagnostic Plan Update ? ?04/24/2021 ?Time of Session: 10:30am ?[name] ?MRN: [id number] ? ?Principal Diagnosis: Schizoaffective disorder ([place]) ? ?Secondary Diagnoses: Principal Problem: ?  Schizoaffective disorder ([place]) ?Active Problems: ?  Autism ?  Suicidal ideation ?  GAD (generalized anxiety disorder) ?  Paranoia ([place]) ? ? ?Current Medications:  ?Current Facility-Administered Medications  ?Medication Dose Route Frequency Provider Last Rate Last Admin  ? acetaminophen (TYLENOL) tablet 650 mg  650 mg Oral Q6H PRN Tharon Aquas, NP      ? alum & mag hydroxide-simeth (MAALOX/MYLANTA) 200-200-20 MG/5ML suspension 30 mL  30 mL Oral Q4H PRN Tharon Aquas, NP      ? benzocaine (ORAJEL) 10 % mucosal gel   Mouth/Throat TID PRN Harlow Asa, MD   1 application. at 04/22/21 1457  ? bisacodyl (DULCOLAX) EC tablet 5 mg  5 mg Oral Daily PRN Nicholes Rough, NP   5 mg at 04/23/21 1627  ? cariprazine (VRAYLAR) capsule 1.5 mg  1.5 mg Oral Daily Nicholes Rough, NP   1.5 mg at 04/24/21 0741  ? diclofenac Sodium (VOLTAREN) 1 % topical gel 2 g  2 g Topical BID Nicholes Rough, NP   2 g at 04/24/21 0741  ? escitalopram (LEXAPRO) tablet 10 mg  10 mg Oral Daily Nicholes Rough, NP   10 mg at 04/24/21 0741  ? gabapentin (NEURONTIN) capsule 200 mg  200 mg Oral TID Nicholes Rough, NP   200 mg at 04/24/21 2542  ? hydrOXYzine (ATARAX) tablet 25 mg  25 mg Oral TID PRN Nicholes Rough, NP   25 mg at 04/24/21 0355  ? ibuprofen (ADVIL) tablet 400 mg  400 mg Oral Q6H PRN Nicholes Rough, NP   400 mg at 04/23/21 2005  ? magnesium hydroxide (MILK OF MAGNESIA) suspension 30 mL  30 mL Oral Daily PRN Tharon Aquas, NP   30 mL at 04/22/21 2057  ? melatonin tablet 5 mg  5 mg Oral QHS Nicholes Rough, NP   5 mg at 04/23/21 2118  ? ondansetron (ZOFRAN-ODT) disintegrating tablet 4 mg  4 mg Oral Q8H PRN Nicholes Rough, NP   4 mg at 04/24/21 7062  ? pantoprazole (PROTONIX) EC tablet 40 mg  40 mg  Oral Daily Tharon Aquas, NP   40 mg at 04/24/21 0741  ? polyethylene glycol (MIRALAX / GLYCOLAX) packet 17 g  17 g Oral Daily Nicholes Rough, NP   17 g at 04/24/21 0741  ? risperiDONE (RISPERDAL) tablet 0.5 mg  0.5 mg Oral Once Nicholes Rough, NP      ? traZODone (DESYREL) tablet 50 mg  50 mg Oral QHS PRN Tharon Aquas, NP   50 mg at 04/23/21 2119  ? ?PTA Medications: ?Medications Prior to Admission  ?Medication Sig Dispense Refill Last Dose  ? albuterol (PROVENTIL HFA;VENTOLIN HFA) 108 (90 Base) MCG/ACT inhaler Inhale 1-2 puffs into the lungs every 6 (six) hours as needed for wheezing or shortness of breath.     ? hydrOXYzine (VISTARIL) 25 MG capsule Take 50 mg by mouth 2 (two) times daily.     ? ibuprofen (ADVIL) 200 MG tablet Take 400 mg by mouth every 6 (six) hours as needed for headache or moderate pain.     ? melatonin 3 MG TABS tablet Take 3 mg by mouth at bedtime.     ? ondansetron (ZOFRAN) 4 MG tablet Take 1 tablet (4 mg total) by mouth every 6 (six) hours. (Patient taking differently: Take  4 mg by mouth every 8 (eight) hours as needed for vomiting or nausea.) 12 tablet 0   ? pantoprazole (PROTONIX) 40 MG tablet Take 40 mg by mouth daily.     ? risperiDONE (RISPERDAL) 3 MG tablet Take 1 tablet (3 mg total) by mouth at bedtime. 30 tablet 0   ? sertraline (ZOLOFT) 50 MG tablet Take 3 tablets (150 mg total) by mouth daily.     ? ? ?Patient Stressors: Marital or family conflict   ?Traumatic event   ? ?Patient Strengths: Other: none indicated by patient ? ?Treatment Modalities: Medication Management, Group therapy, Case management,  ?1 to 1 session with clinician, Psychoeducation, Recreational therapy. ? ? ?Physician Treatment Plan for Primary Diagnosis: Schizoaffective disorder ([place]) ?Long Term Goal(s): Improvement in symptoms so as ready for discharge  ? ?Short Term Goals: Ability to identify changes in lifestyle to reduce recurrence of condition will improve ?Ability to verbalize feelings will  improve ?Ability to disclose and discuss suicidal ideas ?Ability to identify and develop effective coping behaviors will improve ?Compliance with prescribed medications will improve ? ?Medication Management: Evaluate patient's response, side effects, and tolerance of medication regimen. ? ?Therapeutic Interventions: 1 to 1 sessions, Unit Group sessions and Medication administration. ? ?Evaluation of Outcomes: Not Met ? ?Physician Treatment Plan for Secondary Diagnosis: Principal Problem: ?  Schizoaffective disorder ([place]) ?Active Problems: ?  Autism ?  Suicidal ideation ?  GAD (generalized anxiety disorder) ?  Paranoia ([place]) ? ?Long Term Goal(s): Improvement in symptoms so as ready for discharge  ? ?Short Term Goals: Ability to identify changes in lifestyle to reduce recurrence of condition will improve ?Ability to verbalize feelings will improve ?Ability to disclose and discuss suicidal ideas ?Ability to identify and develop effective coping behaviors will improve ?Compliance with prescribed medications will improve    ? ?Medication Management: Evaluate patient's response, side effects, and tolerance of medication regimen. ? ?Therapeutic Interventions: 1 to 1 sessions, Unit Group sessions and Medication administration. ? ?Evaluation of Outcomes: Not Met ? ? ?RN Treatment Plan for Primary Diagnosis: Schizoaffective disorder ([place]) ?Long Term Goal(s): Knowledge of disease and therapeutic regimen to maintain health will improve ? ?Short Term Goals: Ability to remain free from injury will improve, Ability to verbalize frustration and anger appropriately will improve, Ability to demonstrate self-control, Ability to identify and develop effective coping behaviors will improve, and Compliance with prescribed medications will improve ? ?Medication Management: RN will administer medications as ordered by provider, will assess and evaluate patient's response and provide education to patient for prescribed medication. RN will  report any adverse and/or side effects to prescribing provider. ? ?Therapeutic Interventions: 1 on 1 counseling sessions, Psychoeducation, Medication administration, Evaluate responses to treatment, Monitor vital signs and CBGs as ordered, Perform/monitor CIWA, COWS, AIMS and Fall Risk screenings as ordered, Perform wound care treatments as ordered. ? ?Evaluation of Outcomes: Not Met ? ? ?LCSW Treatment Plan for Primary Diagnosis: Schizoaffective disorder ([place]) ?Long Term Goal(s): Safe transition to appropriate next level of care at discharge, Engage patient in therapeutic group addressing interpersonal concerns. ? ?Short Term Goals: Engage patient in aftercare planning with referrals and resources, Increase social support, Increase ability to appropriately verbalize feelings, Increase emotional regulation, Identify triggers associated with mental health/substance abuse issues, and Increase skills for wellness and recovery ? ?Therapeutic Interventions: Assess for all discharge needs, 1 to 1 time with Social worker, Explore available resources and support systems, Assess for adequacy in community support network, Educate family and significant other(s) on suicide  prevention, Complete Psychosocial Assessment, Interpersonal group therapy. ? ?Evaluation of Outcomes: Not Met ? ? ?Progress in Treatment: ?Attending groups: No. ?Participating in groups: No. ?Taking medication as prescribed: Yes. ?Toleration medication: Yes. ?Family/Significant other contact made: No, will contact:  CST case manager ?Patient understands diagnosis: No. ?Discussing patient identified problems/goals with staff: Yes. ?Medical problems stabilized or resolved: Yes. ?Denies suicidal/homicidal ideation: Yes. ?Issues/concerns per patient self-inventory: No. ? ? ?New problem(s) identified: No, Describe:  none ? ?New Short Term/Long Term Goal(s): medication stabilization, elimination of SI thoughts, development of comprehensive mental wellness plan.   ? ? ?Patient Goals:  "Change medicine and go home" ? ?Discharge Plan or Barriers: Patient is to return home at discharge. Patient is to continue to receive services for medication management and CST from [facility]

## 2021-04-24 NOTE — Group Note (Signed)
Recreation Therapy Group Note ? ? ?Group Topic:Stress Management  ?Group Date: 04/24/2021 ?Start Time: 0930 ?End Time: 9611 ?Facilitators: Victorino Sparrow, LRT,CTRS ?Location: [facility] ? ? ?Goal Area(s) Addresses:  ?Patient will identify positive stress management techniques. ?Patient will identify benefits of using stress management post d/c. ? ?Group Description:  Meditation.  LRT played a meditation that focused on finding your rhythm through breathing to help calm stress.  Patients were to let whatever rhythm of breathing that was comfortable to them, calm and relax them.  Patients were to listen along as meditation played to fully engage in activity.   ? ? ?Affect/Mood: N/A ?  ?Participation Level: Did not attend ?  ? ?Clinical Observations/Individualized Feedback:   ? ? ?Plan: Continue to engage patient in RT group sessions 2-3x/week. ? ? ?Victorino Sparrow, LRT,CTRS ?04/24/2021 11:21 AM ?
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
## 2021-04-25 MED ORDER — PROPRANOLOL HCL 10 MG PO TABS
10.0000 mg | ORAL_TABLET | Freq: Two times a day (BID) | ORAL | Status: DC
  Administered 2021-04-25 – 2021-04-26 (×2): 10 mg via ORAL
  Filled 2021-04-25 (×5): qty 1

## 2021-04-25 MED ORDER — CARIPRAZINE HCL 3 MG PO CAPS
3.0000 mg | ORAL_CAPSULE | Freq: Every day | ORAL | Status: DC
  Administered 2021-04-26: 3 mg via ORAL
  Filled 2021-04-25 (×2): qty 1

## 2021-04-25 MED ORDER — TRAZODONE HCL 100 MG PO TABS
100.0000 mg | ORAL_TABLET | Freq: Every day | ORAL | Status: DC
  Administered 2021-04-25: 100 mg via ORAL
  Filled 2021-04-25 (×3): qty 1

## 2021-04-25 NOTE — Progress Notes (Signed)
[facility] MD Progress Note ? ?04/25/2021 2:22 PM ?[name]  ?MRN:  [id number] ? ? ?Subjective:  [name] reports: "I I am not feeling suicidal, I am not hearing the voices, and I signed a 72." ? ?Today's assessment (04/25/2021): Pt with flat affect and depressed mood, attention to personal hygiene and grooming is fair, eye contact is good, speech is clear & coherent. Thought contents are organized and logical. Pt is currently denying SI/HI/AVH. She states that she no longer feels like her deceased husband's brother is out to get her.  She also currently denies thinking that her neighbors might go into her home while she is here at the hospital, and do things to her home. She verbalizes readiness for discharge and states that she signed a 72 hr notice for discharge and is ready to go home. ? ?Pt reports a good appetite, reports a good sleep quality last night, reports wanting her Trazodone to be a scheduled medication and not a as needed medication. She reports moderate right ear drum. Tylenol 650 mg PRN is ordered and pt educated on this. Pt has been educated to also follow the ear pain up with her PCP after discharge. Risperdal taper is complete, and pt is currently on  Vraylar 1.5 mg daily. Will increase Vraylar to 3 mg daily starting tomorrow (04/26/21) for management of psychosis.  Will continue other medications as listed below as pt is making progress on them. ? ?Reason for Admission: [name] is a 31 y.o Caucasian female with past mental health diagnoses of  MDD, bipolar d/o, schizoaffective d/o, GAD & autism who presented to the [facility] with paranoia & suicidal ideations with a plan to use a knife or take pills to kill herself. Pt's husband completed suicide in March 2019, and pt is paranoid that his brother is out to get her. As per chart review, pt has had multiple visits to the [facility] health urgent care [facility]), and to this [facility]. Pt was transferred to this [facility] for  treatment and stabilization of her mood. ?  ?Principal Problem: Schizoaffective disorder ([place]) ?Diagnosis: Principal Problem: ?  Schizoaffective disorder ([place]) ?Active Problems: ?  Autism ?  Suicidal ideation ?  GAD (generalized anxiety disorder) ?  Paranoia ([place]) ? ?Total Time spent with patient: 20 minutes ? ?Past Psychiatric History: As above ? ?Past Medical History:  ?Past Medical History:  ?Diagnosis Date  ? Acid reflux   ? Anxiety   ? Asthma   ? last attack 03/13/15 or 03/14/15  ? Autism   ? Carrier of fragile X syndrome   ? Chronic constipation   ? Depression   ? Drug-seeking behavior   ? Essential tremor   ? Headache   ? Overdose of acetaminophen 07/2017  ? and other meds  ? Personality disorder ([place])   ? Schizo-affective psychosis ([place])   ? Schizoaffective disorder, bipolar type ([place])   ? Seizures ([place])   ? Last seizure December 2017  ? Sleep apnea   ?  ?Past Surgical History:  ?Procedure Laterality Date  ? MOUTH SURGERY  2009 or 2010  ? ?Family History:  ?Family History  ?Problem Relation Age of Onset  ? Mental illness Father   ? Asthma Father   ? PDD Brother   ? Seizures Brother   ? ?Family Psychiatric  History: As above ?Social History:  ?Social History  ? ?Substance and Sexual Activity  ?Alcohol Use No  ? Alcohol/week: 1.0 standard drink  ? Types:  1 Standard drinks or equivalent per week  ? Comment: denies at this time  ?   ?Social History  ? ?Substance and Sexual Activity  ?Drug Use No  ? Comment: History of cocaine use at age 31 for 4 months  ?  ?Social History  ? ?Socioeconomic History  ? Marital status: Widowed  ?  Spouse name: Not on file  ? Number of children: 0  ? Years of education: Not on file  ? Highest education level: Not on file  ?Occupational History  ? Occupation: disability  ?Tobacco Use  ? Smoking status: Former  ?  Packs/day: 0.00  ?  Types: Cigarettes  ? Smokeless tobacco: Never  ? Tobacco comments:  ?  Smoked for 2  years age [age]  ?Vaping Use  ? Vaping Use: Never used  ?Substance and  Sexual Activity  ? Alcohol use: No  ?  Alcohol/week: 1.0 standard drink  ?  Types: 1 Standard drinks or equivalent per week  ?  Comment: denies at this time  ? Drug use: No  ?  Comment: History of cocaine use at age 53 for 4 months  ? Sexual activity: Yes  ?  Birth control/protection: None  ?Other Topics Concern  ? Not on file  ?Social History Narrative  ? Marital status: Widowed  ?    Children: daughter  ?    Lives: with boyfriend, in two story home  ?    Employment:  Disability  ?    Tobacco: quit smoking; smoked for two years.  ?    Alcohol ;none  ?    Drugs: none  ? Has not traveled outside of the country.  ? Right handed   ?     ? ?Social Determinants of Health  ? ?Financial Resource Strain: Not on file  ?Food Insecurity: Not on file  ?Transportation Needs: Not on file  ?Physical Activity: Not on file  ?Stress: Not on file  ?Social Connections: Not on file  ? ?Sleep: Fair ? ?Appetite:  Fair ? ?Current Medications: ?Current Facility-Administered Medications  ?Medication Dose Route Frequency Provider Last Rate Last Admin  ? acetaminophen (TYLENOL) tablet 650 mg  650 mg Oral Q6H PRN Tharon Aquas, NP   650 mg at 04/25/21 1156  ? alum & mag hydroxide-simeth (MAALOX/MYLANTA) 200-200-20 MG/5ML suspension 30 mL  30 mL Oral Q4H PRN Tharon Aquas, NP      ? benzocaine (ORAJEL) 10 % mucosal gel   Mouth/Throat TID PRN Harlow Asa, MD   1 application. at 04/22/21 1457  ? bisacodyl (DULCOLAX) EC tablet 5 mg  5 mg Oral Daily PRN Nicholes Rough, NP   5 mg at 04/23/21 1627  ? [START ON 04/26/2021] cariprazine (VRAYLAR) capsule 3 mg  3 mg Oral Daily Massengill, Nathan, MD      ? diclofenac Sodium (VOLTAREN) 1 % topical gel 2 g  2 g Topical BID Emet Rafanan, NP   2 g at 04/25/21 0802  ? escitalopram (LEXAPRO) tablet 10 mg  10 mg Oral Daily Nicholes Rough, NP   10 mg at 04/25/21 0800  ? gabapentin (NEURONTIN) capsule 200 mg  200 mg Oral TID Nicholes Rough, NP   200 mg at 04/25/21 1156  ? hydrOXYzine (ATARAX)  tablet 25 mg  25 mg Oral TID PRN Nicholes Rough, NP   25 mg at 04/24/21 1524  ? ibuprofen (ADVIL) tablet 400 mg  400 mg Oral Q6H PRN Nicholes Rough, NP   400 mg at 04/24/21  1954  ? magnesium hydroxide (MILK OF MAGNESIA) suspension 30 mL  30 mL Oral Daily PRN Tharon Aquas, NP   30 mL at 04/25/21 1253  ? melatonin tablet 5 mg  5 mg Oral QHS Nicholes Rough, NP   5 mg at 04/24/21 2123  ? ondansetron (ZOFRAN-ODT) disintegrating tablet 4 mg  4 mg Oral Q8H PRN Nicholes Rough, NP   4 mg at 04/24/21 2395  ? pantoprazole (PROTONIX) EC tablet 40 mg  40 mg Oral Daily Tharon Aquas, NP   40 mg at 04/25/21 0800  ? polyethylene glycol (MIRALAX / GLYCOLAX) packet 17 g  17 g Oral Daily Nicholes Rough, NP   17 g at 04/24/21 0741  ? traZODone (DESYREL) tablet 100 mg  100 mg Oral QHS Nicholes Rough, NP      ? ? ?Lab Results:  ?Results for orders placed or performed during the hospital encounter of 04/21/21 (from the past 48 hour(s))  ?Lipid panel     Status: Abnormal  ? Collection Time: 04/24/21  6:25 AM  ?Result Value Ref Range  ? Cholesterol 190 0 - 200 mg/dL  ? Triglycerides 220 (H) <150 mg/dL  ? HDL 44 >40 mg/dL  ? Total CHOL/HDL Ratio 4.3 RATIO  ? VLDL 44 (H) 0 - 40 mg/dL  ? LDL Cholesterol 102 (H) 0 - 99 mg/dL  ?  Comment:        ?Total Cholesterol/HDL:CHD Risk ?Coronary Heart Disease Risk Table ?                    Men   Women ? 1/2 Average Risk   3.4   3.3 ? Average Risk       5.0   4.4 ? 2 X Average Risk   9.6   7.1 ? 3 X Average Risk  23.4   11.0 ?       ?Use the calculated Patient Ratio ?above and the CHD Risk Table ?to determine the patient's CHD Risk. ?       ?ATP III CLASSIFICATION (LDL): ? <100     mg/dL   Optimal ? 100-129  mg/dL   Near or Above ?                   Optimal ? 130-159  mg/dL   Borderline ? 160-189  mg/dL   High ? >190     mg/dL   Very High ?Performed at [facility] [place] [address] [place], [place] [address] ?  ? ?Blood Alcohol level:  ?Lab Results  ?Component Value Date   ? ETH <10 03/02/2021  ? ETH <10 11/21/2020  ? ?Metabolic Disorder Labs: ?Lab Results  ?Component Value Date  ? HGBA1C 5.1 03/02/2021  ? MPG 99.67 03/02/2021  ? MPG 96.8 07/02/2019  ? ?Lab Results  ?Component Val

## 2021-04-25 NOTE — Group Note (Signed)
Recreation Therapy Group Note ? ? ?Group Topic:Animal Assisted Therapy   ?Group Date: 04/25/2021 ?Start Time: 1430 ?End Time: 1610 ?Facilitators: Victorino Sparrow, LRT,CTRS ?Location: [facility] ? ? ?AAA/T Program Assumption of Risk Form signed by Patient/ or Parent Legal Guardian Yes ? ?Patient understands his/her participation is voluntary Yes ? ? ?Affect/Mood: N/A ?  ?Participation Level: Did not attend ?  ? ?Clinical Observations/Individualized Feedback:   ? ? ?Plan: Continue to engage patient in RT group sessions 2-3x/week. ? ? ?Victorino Sparrow, LRT,CTRS ?04/25/2021 3:19 PM ?

## 2021-04-25 NOTE — Group Note (Signed)
Date:  04/25/2021 ?Time:  11:08 AM ? ?Group Topic/Focus:  ?Orientation:   The focus of this group is to educate the patient on the purpose and policies of crisis stabilization and provide a format to answer questions about their admission.  The group details unit policies and expectations of patients while admitted. ? ? ? ?Participation Level:  Did Not Attend ? ?Participation Quality:     ? ?Affect:     ? ?Cognitive:     ? ?Insight: None ? ?Engagement in Group:     ? ?Modes of Intervention:     ? ?Additional Comments:    ? ?Jerrye Beavers ?04/25/2021, 11:08 AM ? ?

## 2021-04-25 NOTE — Progress Notes (Signed)
?   04/25/21 0800  ?Psych Admission Type (Psych Patients Only)  ?Admission Status Voluntary  ?Psychosocial Assessment  ?Patient Complaints Anxiety  ?Eye Contact Intense  ?Facial Expression Anxious  ?Affect Anxious;Euphoric  ?Speech Logical/coherent  ?Interaction Childlike;Manipulative  ?Motor Activity Restless  ?Appearance/Hygiene Unremarkable  ?Behavior Characteristics Cooperative  ?Mood Depressed;Anxious  ?Thought Process  ?Coherency Circumstantial  ?Content Blaming self;Preoccupation  ?Delusions None reported or observed  ?Perception WDL  ?Hallucination None reported or observed  ?Judgment Limited  ?Confusion WDL  ?Danger to Self  ?Current suicidal ideation? Denies  ?Self-Injurious Behavior No self-injurious ideation or behavior indicators observed or expressed   ?Agreement Not to Harm Self Yes  ?Danger to Others  ?Danger to Others None reported or observed  ? ?Patient is alert and oriented X's 4 during  shift assessment.  Patient is preoccupied with self, chatty and complains about almost everything. Patient denies SI/HI/AVH, she tolerated scheduled medication well with no side effect noted. Milk of magnesia administered at 1253 for complaint of indigestion and orajel administered at 1644 for complaint of toothachs . Patient denies Pain or any discomfort. No distress noted at this time. Staff will continue to monitor. ?

## 2021-04-25 NOTE — [facility] Suicide Risk Assessment (Signed)
[facility] INPATIENT:  Family/Significant Other Suicide Prevention Education ? ?Suicide Prevention Education:  ?Education Completed; Journalist, newspaper [name] [phone] has been identified by the patient as the family member/significant other with whom the patient will be residing, and identified as the person(s) who will aid the patient in the event of a mental health crisis (suicidal ideations/suicide attempt).  With written consent from the patient, the family member/significant other has been provided the following suicide prevention education, prior to the and/or following the discharge of the patient. ? ?The suicide prevention education provided includes the following: ?Suicide risk factors ?Suicide prevention and interventions ?National Suicide Hotline telephone number ?[facility] assessment telephone number ?[facility] Emergency Assistance 911 ?[place] and/or Residential Mobile Crisis Unit telephone number ? ?Request made of family/significant other to: ?Remove weapons (e.g., guns, rifles, knives), all items previously/currently identified as safety concern.   ?Remove drugs/medications (over-the-counter, prescriptions, illicit drugs), all items previously/currently identified as a safety concern. ? ?The family member/significant other verbalizes understanding of the suicide prevention education information provided.  The family member/significant other agrees to remove the items of safety concern listed above. ? ?CSW spoke with CST member [name] with [facility] who states that the Pt "often makes decisions based on emotions and those emotions fluctuate".  She states that the Pt was stepped down from ACTT services approximately 2 months ago and has been working with [facility] and [facility].  She states that the Pt has been hospitalized 2 times since then and she has informed the Pt that if this behavior continues then she will be placed back on ACTT  services.  [name] states that CST comes to meet with the Pt 3 to 4 times a week.  She states that once the Pt is discharged the CST will be in touch with her the same day.  [name] states that there are no weapons or firearms in the Pt's possession.  CSW completed SPE with [name].    ? ?[name] ?04/25/2021, 2:48 PM ?

## 2021-04-26 ENCOUNTER — Other Ambulatory Visit: Payer: Self-pay | Admitting: Family

## 2021-04-26 DIAGNOSIS — F251 Schizoaffective disorder, depressive type: Secondary | ICD-10-CM

## 2021-04-26 MED ORDER — HYDROXYZINE HCL 25 MG PO TABS: 25.0000 mg | ORAL_TABLET | Freq: Three times a day (TID) | ORAL | 0 refills | Status: DC | PRN

## 2021-04-26 MED ORDER — CARIPRAZINE HCL 3 MG PO CAPS: 3.0000 mg | ORAL_CAPSULE | Freq: Every day | ORAL | 0 refills | Status: AC

## 2021-04-26 MED ORDER — TRAZODONE HCL 100 MG PO TABS: 100.0000 mg | ORAL_TABLET | Freq: Every day | ORAL | 0 refills | Status: DC

## 2021-04-26 MED ORDER — PROPRANOLOL HCL 10 MG PO TABS: 10.0000 mg | ORAL_TABLET | Freq: Two times a day (BID) | ORAL | 0 refills | Status: DC

## 2021-04-26 MED ORDER — MELATONIN 5 MG PO TABS
5.0000 mg | ORAL_TABLET | Freq: Every day | ORAL | 0 refills | Status: AC
Start: 2021-04-26 — End: 2021-05-26

## 2021-04-26 MED ORDER — GABAPENTIN 100 MG PO CAPS
200.0000 mg | ORAL_CAPSULE | Freq: Three times a day (TID) | ORAL | 0 refills | Status: DC
Start: 2021-04-26 — End: 2021-07-04

## 2021-04-26 MED ORDER — ESCITALOPRAM OXALATE 10 MG PO TABS: 10.0000 mg | ORAL_TABLET | Freq: Every day | ORAL | 0 refills | Status: DC

## 2021-04-26 NOTE — Progress Notes (Deleted)
?   04/25/21 2012  ?Psych Admission Type (Psych Patients Only)  ?Admission Status Voluntary  ?Psychosocial Assessment  ?Patient Complaints Anxiety  ?Eye Contact Intense  ?Facial Expression Anxious  ?Affect Anxious  ?Speech Logical/coherent  ?Interaction Childlike;Needy  ?Motor Activity Fidgety  ?Appearance/Hygiene Unremarkable  ?Behavior Characteristics Cooperative  ?Mood Anxious;Depressed  ?Thought Process  ?Coherency Circumstantial  ?Content Blaming self;Preoccupation  ?Delusions None reported or observed  ?Perception WDL  ?Hallucination None reported or observed  ?Judgment Limited  ?Confusion WDL  ?Danger to Self  ?Current suicidal ideation? Denies  ?Self-Injurious Behavior No self-injurious ideation or behavior indicators observed or expressed   ?Agreement Not to Harm Self Yes  ?Description of Agreement Verbal Contract  ?Danger to Others  ?Danger to Others None reported or observed  ? ? ?

## 2021-04-26 NOTE — [facility] Group Notes (Signed)
Pt attended group and participated in discussion. 

## 2021-04-26 NOTE — Progress Notes (Signed)
Discharge Note:  Patient discharged with taxi voucher to her home.  Suicide prevention information given and discussed with patient who stated she understood and had no questions.  Denied SI and HI.  Denied A/V hallucinations.  Patient stated she received all her belongings, clothing, toiletries, etc.  All required discharge information given.  Patient stated she appreciated all assistance received from [facility] staff. ? ?

## 2021-04-26 NOTE — Discharge Instructions (Signed)
?-  Follow-up with your outpatient psychiatric provider -instructions on appointment date, time, and address (location) are provided to you in discharge paperwork. ? ?-Take your psychiatric medications as prescribed at discharge - instructions are provided to you in the discharge paperwork ? ?-Follow-up with outpatient primary care doctor and other specialists -for management of chronic medical disease. ? ?-Testing: Follow-up with outpatient provider for abnormal lab results:  ?Elevated Prolactin level 03-02-2021: 66.2 (H) - risperdal was stopped and vraylar was started. Recommend trending this lab in 1-3 months. Pt reports that galactorrhea stopped after risperdal was stopped during this hospitalization.  ? ?-Recommend abstinence from alcohol, tobacco, and other illicit drug use at discharge.  ? ?-If your psychiatric symptoms recur, worsen, or if you have side effects to your psychiatric medications, call your outpatient psychiatric provider, 911, [phone] or go to the nearest emergency department. ? ?-If suicidal thoughts recur, call your outpatient psychiatric provider, 911, [phone] or go to the nearest emergency department. ? ?

## 2021-04-26 NOTE — Discharge Summary (Signed)
Physician Discharge Summary Note ? ?Patient:  [name] is an 31 y.o., female ?MRN:  [id number] ?DOB:  02/23/1990 ?Patient phone:  [phone] (home)  ?Patient address:   ?[address] ?[place] [address],  ?Total Time spent with patient: 30 minutes ? ?Date of Admission:  04/21/2021 ?Date of Discharge: 04/26/2021 ? ?Reason for Admission:  History of Present Illness: [name]. [name] is a 31 y.o Caucasian female with past mental health diagnoses of  MDD, bipolar d/o, schizoaffective d/o, GAD & autism who presented to the [facility] with paranoia & suicidal ideations with a plan to use a knife or take pills to kill herself. Pt's husband completed suicide in March 2019, and pt is paranoid that his brother is out to get her. As per chart review, pt has had multiple visits to the [facility] health urgent care [facility]), and to this [facility]. Pt was transferred to this [facility] for treatment and stabilization of her mood. ? ?Principal Problem: Schizoaffective disorder ([place]) ?Discharge Diagnoses: Principal Problem: ?  Schizoaffective disorder ([place]) ?Active Problems: ?  Autism ?  Suicidal ideation ?  GAD (generalized anxiety disorder) ?  Paranoia ([place]) ? ?Past Psychiatric History: As above ? ?Past Medical History:  ?Past Medical History:  ?Diagnosis Date  ? Acid reflux   ? Anxiety   ? Asthma   ? last attack 03/13/15 or 03/14/15  ? Autism   ? Carrier of fragile X syndrome   ? Chronic constipation   ? Depression   ? Drug-seeking behavior   ? Essential tremor   ? Headache   ? Overdose of acetaminophen 07/2017  ? and other meds  ? Personality disorder ([place])   ? Schizo-affective psychosis ([place])   ? Schizoaffective disorder, bipolar type ([place])   ? Seizures ([place])   ? Last seizure December 2017  ? Sleep apnea   ?  ?Past Surgical History:  ?Procedure Laterality Date  ? MOUTH SURGERY  2009 or 2010  ? ?Family History:  ?Family History  ?Problem Relation Age of Onset  ? Mental illness Father   ? Asthma  Father   ? PDD Brother   ? Seizures Brother   ? ?Family Psychiatric  History: As above ?Social History:  ?Social History  ? ?Substance and Sexual Activity  ?Alcohol Use No  ? Alcohol/week: 1.0 standard drink  ? Types: 1 Standard drinks or equivalent per week  ? Comment: denies at this time  ?   ?Social History  ? ?Substance and Sexual Activity  ?Drug Use No  ? Comment: History of cocaine use at age 39 for 4 months  ?  ?Social History  ? ?Socioeconomic History  ? Marital status: Widowed  ?  Spouse name: Not on file  ? Number of children: 0  ? Years of education: Not on file  ? Highest education level: Not on file  ?Occupational History  ? Occupation: disability  ?Tobacco Use  ? Smoking status: Former  ?  Packs/day: 0.00  ?  Types: Cigarettes  ? Smokeless tobacco: Never  ? Tobacco comments:  ?  Smoked for 2  years age [age]  ?Vaping Use  ? Vaping Use: Never used  ?Substance and Sexual Activity  ? Alcohol use: No  ?  Alcohol/week: 1.0 standard drink  ?  Types: 1 Standard drinks or equivalent per week  ?  Comment: denies at this time  ? Drug use: No  ?  Comment: History of cocaine use at age 29 for  4 months  ? Sexual activity: Yes  ?  Birth control/protection: None  ?Other Topics Concern  ? Not on file  ?Social History Narrative  ? Marital status: Widowed  ?    Children: daughter  ?    Lives: with boyfriend, in two story home  ?    Employment:  Disability  ?    Tobacco: quit smoking; smoked for two years.  ?    Alcohol ;none  ?    Drugs: none  ? Has not traveled outside of the country.  ? Right handed   ?     ? ?Social Determinants of Health  ? ?Financial Resource Strain: Not on file  ?Food Insecurity: Not on file  ?Transportation Needs: Not on file  ?Physical Activity: Not on file  ?Stress: Not on file  ?Social Connections: Not on file  ? ?Hospital Course:  HOSPITAL COURSE: ?During the patient's hospitalization, patient had extensive initial psychiatric evaluation, and follow-up psychiatric evaluations every day.   Psychiatric diagnoses were made, and the following medication adjustments were made on admission: ?  ?Schizoaffective Disorder bipolar type by hx ?-Start Vraylar 1.5 mg daily for psychosis ?-Start Lexapro (5 mg on 3/18 & 10 mg thereafter) ?-Taper off Zoloft ('75mg'$  on 3/18, 50 mg on 3/19, then stop) ?-Taper off Risperdal (1.5 mg tonight, '1mg'$  on 3/19, 0.5 mg on 3/10, then stop) ?  ?Anxiety ?-Continue Hydroxyzine 25 mg every 6 hours PRN ?-Continue Gabapentin 100 mg TID ?  ?Insomnia ?-Start Trazodone 50 mg nightly PRN ?-Start Melatonin 5 mg nightly  ?  ?During the hospitalization, other adjustments were made to the patient's psychiatric medication regimen with the final medications at discharge being as follows: ?  ?Schizoaffective Disorder bipolar type by hx ?-Continue Vraylar 3 mg daily for psychosis ?-Continue Lexapro 10 mg daily for depression ?  ?Anxiety ?-Continue Hydroxyzine 25 mg every 6 hours PRN ?-Continue Gabapentin 200 mg TID ?-Continue Propranolol 10 mg BID ?  ?Insomnia ?Continue Trazodone 100 mg nightly ?Continue Melatonin to 5 mg nightly  ?  ?Osteoarthritis ?Continue Voltaren BID  ?  ?Patient's care was discussed during the interdisciplinary team meeting every day during the hospitalization. ?The patient denies having side effects to prescribed psychiatric medication. The patient was evaluated each day by a clinical provider to ascertain response to treatment. Improvement was noted by the patient's report of decreasing symptoms, improved sleep and appetite, affect, medication tolerance, behavior, and participation in unit programming.  Patient was asked each day to complete a self inventory noting mood, mental status, pain, new symptoms, anxiety and concerns.   ?Symptoms were reported as significantly decreased or resolved completely by discharge.  ?The patient reports that their mood is stable.  ?The patient denied having suicidal thoughts for more than 48 hours prior to discharge.  Patient denies having  homicidal thoughts.  Patient denies having auditory hallucinations.  Patient denies any visual hallucinations or other symptoms of psychosis.  ?The patient was motivated to continue taking medication with a goal of continued improvement in mental health.  ?  ?The patient reports their target psychiatric symptoms of psychosis responded well to the psychiatric medications, and the patient reports overall benefit from this psychiatric hospitalization. Supportive psychotherapy was provided to the patient. The patient also participated in regular group therapy while hospitalized. Coping skills, problem solving as well as relaxation therapies were also part of the unit programming. ? ?Physical Findings: ?AIMS: Facial and Oral Movements ?Muscles of Facial Expression: None, normal ?Lips and Perioral Area: None,  normal ?Jaw: None, normal ?Tongue: None, normal,Extremity Movements ?Upper (arms, wrists, hands, fingers): None, normal ?Lower (legs, knees, ankles, toes): None, normal, Trunk Movements ?Neck, shoulders, hips: None, normal, Overall Severity ?Severity of abnormal movements (highest score from questions above): None, normal ?Incapacitation due to abnormal movements: None, normal ?Patient's awareness of abnormal movements (rate only patient's report): No Awareness, Dental Status ?Current problems with teeth and/or dentures?: No ?Does patient usually wear dentures?: No  ?CIWA:  n/a ?COWS:  n/a ?AIMS: 0  ? ?Musculoskeletal: ?Strength & Muscle Tone: within normal limits ?Gait & Station: normal ?Patient leans: N/A ? ?Psychiatric Specialty Exam: ? ?Presentation  ?General Appearance: Appropriate for Environment ? ?Eye Contact:Good ? ?Speech:Clear and Coherent ? ?Speech Volume:Normal ? ?Handedness:Right ? ?Mood and Affect  ?Mood:Euthymic ? ?Affect:Appropriate ? ?Thought Process  ?Thought Processes:Coherent ? ?Descriptions of Associations:Intact ? ?Orientation:Full (Time, Place and Person) ? ?Thought Content:Logical ? ?History  of Schizophrenia/Schizoaffective disorder:Yes ? ?Duration of Psychotic Symptoms:Greater than six months ? ?Hallucinations:Hallucinations: None ? ?Ideas of Reference:None ? ?Suicidal Thoughts:Suicidal Tho

## 2021-04-26 NOTE — Group Note (Signed)
Recreation Therapy Group Note ? ? ?Group Topic:Stress Management  ?Group Date: 04/26/2021 ?Start Time: 0930 ?End Time: 9767 ?Facilitators: Victorino Sparrow, LRT,CTRS ?Location: [facility] ? ? ?Goal Area(s) Addresses:  ?Patient will actively participate in stress management techniques presented during session.  ?Patient will successfully identify benefit of practicing stress management post d/c.  ? ?Group Description: Guided Imagery. LRT provided education, instruction, and demonstration on practice of visualization via guided imagery. Patient was asked to participate in the technique introduced during session. LRT debriefed including topics of mindfulness, stress management and specific scenarios each patient could use these techniques. Patients were given suggestions of ways to access scripts post d/c and encouraged to explore Youtube and other apps available on smartphones, tablets, and computers. ? ? ?Affect/Mood: Appropriate ?  ?Participation Level: Active ?  ?Participation Quality: Independent ?  ?Behavior: Attentive  ?  ?Speech/Thought Process: Focused ?  ?Insight: Good ?  ?Judgement: Good ?  ?Modes of Intervention: Script; Ambient Sounds ?  ?Patient Response to Interventions:  Engaged ?  ?Education Outcome: ? Acknowledges education and In group clarification offered   ? ?Clinical Observations/Individualized Feedback: Pt attended and participated in group session.  ? ? ?Plan: Continue to engage patient in RT group sessions 2-3x/week. ? ? ?Victorino Sparrow, LRT,CTRS ?04/26/2021 10:40 AM ?

## 2021-04-26 NOTE — Progress Notes (Addendum)
D:  Patient denied SI and HI, contracts for safety.  Denied A/V hallucinations.  Denied pain. ?A:  Medications administered per MD orders.  Emotional support and encouragement given patient. ?R:  Safety maintained with 15 minute checks. ? ?Patient's self inventory sheet, patient has good sleep, sleep medication helpful.  Good appetite,   Denied depression, denied hopeless, anxiety 3, want to go home.  Denied withdrawals.  Denied HI, contracts for safety.  Denied SI.  Denied physical problems.  Goal is to discharge.  Plans to talk to MD.   ?Per patient's self inventory.                            ? ?

## 2021-04-26 NOTE — Progress Notes (Signed)
?   04/25/21 2012  ?Psych Admission Type (Psych Patients Only)  ?Admission Status Voluntary  ?Psychosocial Assessment  ?Patient Complaints Anxiety  ?Eye Contact Intense  ?Facial Expression Anxious  ?Affect Anxious  ?Speech Rapid  ?Interaction Childlike;Needy  ?Motor Activity Fidgety  ?Appearance/Hygiene Unremarkable  ?Behavior Characteristics Cooperative  ?Mood Anxious;Depressed  ?Thought Process  ?Coherency Circumstantial  ?Content Blaming self;Preoccupation  ?Delusions None reported or observed  ?Perception WDL  ?Hallucination None reported or observed  ?Judgment Limited  ?Confusion WDL  ?Danger to Self  ?Current suicidal ideation? Denies  ?Self-Injurious Behavior No self-injurious ideation or behavior indicators observed or expressed   ?Agreement Not to Harm Self Yes  ?Description of Agreement Verbal Contract  ?Danger to Others  ?Danger to Others None reported or observed  ? ? ?

## 2021-04-26 NOTE — Plan of Care (Signed)
Nurse discussed anxiety, depression and coping skills with patient.  

## 2021-04-26 NOTE — [facility] Suicide Risk Assessment (Addendum)
Suicide Risk Assessment ? ?Discharge Assessment    ?[facility] Discharge Suicide Risk Assessment ? ? ?Principal Problem: Schizoaffective disorder ([place]) ?Discharge Diagnoses: Principal Problem: ?  Schizoaffective disorder ([place]) ?Active Problems: ?  Autism ?  Suicidal ideation ?  GAD (generalized anxiety disorder) ?  Paranoia ([place]) ? ?History of Present Illness: [name] is a 31 y.o Caucasian female with past mental health diagnoses of  MDD, bipolar d/o, schizoaffective d/o, GAD & autism who presented to the [facility] with paranoia & suicidal ideations with a plan to use a knife or take pills to kill herself. Pt's husband completed suicide in March 2019, and pt is paranoid that his brother is out to get her. As per chart review, pt has had multiple visits to the [facility] health urgent care [facility]), and to this [facility]. Pt was transferred to this [facility] for treatment and stabilization of her mood. ? ?HOSPITAL COURSE: ?During the patient's hospitalization, patient had extensive initial psychiatric evaluation, and follow-up psychiatric evaluations every day.  Psychiatric diagnoses were made, and the following medication adjustments were made on admission: ? ?Schizoaffective Disorder bipolar type by hx ?-Start Vraylar 1.5 mg daily for psychosis ?-Start Lexapro (5 mg on 3/18 & 10 mg thereafter) ?-Taper off Zoloft ('75mg'$  on 3/18, 50 mg on 3/19, then stop) ?-Taper off Risperdal (1.5 mg tonight, '1mg'$  on 3/19, 0.5 mg on 3/10, then stop) ?  ?Anxiety ?-Continue Hydroxyzine 25 mg every 6 hours PRN ?-Continue Gabapentin 100 mg TID ?  ?Insomnia ?-Start Trazodone 50 mg nightly PRN ?-Start Melatonin 5 mg nightly  ?  ?During the hospitalization, other adjustments were made to the patient's psychiatric medication regimen with the final medications at discharge being as follows: ? ?Schizoaffective Disorder bipolar type by hx ?-Continue Vraylar 3 mg daily for psychosis ?-Continue Lexapro 10 mg daily for depression ?   ?Anxiety ?-Continue Hydroxyzine 25 mg every 6 hours PRN ?-Continue Gabapentin 200 mg TID ?-Continue Propranolol 10 mg BID ?  ?Insomnia ?Continue Trazodone 100 mg nightly ?Continue Melatonin to 5 mg nightly  ?  ?Osteoarthritis ?Continue Voltaren BID  ? ?Patient's care was discussed during the interdisciplinary team meeting every day during the hospitalization. ?The patient denies having side effects to prescribed psychiatric medication. The patient was evaluated each day by a clinical provider to ascertain response to treatment. Improvement was noted by the patient's report of decreasing symptoms, improved sleep and appetite, affect, medication tolerance, behavior, and participation in unit programming.  Patient was asked each day to complete a self inventory noting mood, mental status, pain, new symptoms, anxiety and concerns.   ?Symptoms were reported as significantly decreased or resolved completely by discharge.  ?The patient reports that their mood is stable.  ?The patient denied having suicidal thoughts for more than 48 hours prior to discharge.  Patient denies having homicidal thoughts.  Patient denies having auditory hallucinations.  Patient denies any visual hallucinations or other symptoms of psychosis.  ?The patient was motivated to continue taking medication with a goal of continued improvement in mental health.  ? ?The patient reports their target psychiatric symptoms of psychosis responded well to the psychiatric medications, and the patient reports overall benefit from this psychiatric hospitalization. Supportive psychotherapy was provided to the patient. The patient also participated in regular group therapy while hospitalized. Coping skills, problem solving as well as relaxation therapies were also part of the unit programming. ? ?Total Time spent with patient: 30 minutes ? ?Musculoskeletal: ?Strength & Muscle Tone: within normal limits ?  Gait & Station: normal ?Patient leans: N/A ? ?Psychiatric  Specialty Exam ? ?Presentation  ?General Appearance: Appropriate for Environment ? ?Eye Contact:Good ? ?Speech:Clear and Coherent ? ?Speech Volume:Normal ? ?Handedness:Right ? ?Mood and Affect  ?Mood:Euthymic ? ?Duration of Depression Symptoms: Greater than two weeks ? ?Affect:Appropriate ? ?Thought Process  ?Thought Processes:Coherent ? ?Descriptions of Associations:Intact ? ?Orientation:Full (Time, Place and Person) ? ?Thought Content:Logical ? ?History of Schizophrenia/Schizoaffective disorder:Yes ? ?Duration of Psychotic Symptoms:Greater than six months ? ?Hallucinations:Hallucinations: None ? ?Ideas of Reference:None ? ?Suicidal Thoughts:Suicidal Thoughts: No ? ?Homicidal Thoughts:Homicidal Thoughts: No ? ?Sensorium  ?Memory:Immediate Good; Recent Good; Remote Good ? ?Judgment:Good ? ?Insight:Good ? ?Executive Functions  ?Concentration:Good ? ?Attention Span:Good ? ?Recall:Good ? ?Fund of [place] ? ?Language:Good ? ?Psychomotor Activity  ?Psychomotor Activity:Psychomotor Activity: Normal ? ?Assets  ?Assets:Communication Skills ? ?Sleep  ?Sleep:Sleep: Good ? ?Physical Exam: ?Physical Exam ?Constitutional:   ?   Appearance: Normal appearance.  ?HENT:  ?   Head: Normocephalic.  ?   Nose: Nose normal. No congestion or rhinorrhea.  ?Eyes:  ?   Pupils: Pupils are equal, round, and reactive to light.  ?Pulmonary:  ?   Effort: Pulmonary effort is normal. No respiratory distress.  ?Musculoskeletal:     ?   General: Normal range of motion.  ?   Cervical back: Normal range of motion.  ?Neurological:  ?   Mental Status: She is alert and oriented to person, place, and time.  ?Psychiatric:     ?   Behavior: Behavior normal.  ? ?Review of Systems  ?Constitutional: Negative.   ?HENT: Negative.    ?Eyes: Negative.   ?Respiratory: Negative.    ?Cardiovascular: Negative.   ?Gastrointestinal: Negative.   ?Genitourinary: Negative.   ?Musculoskeletal: Negative.   ?Skin: Negative.   ?Neurological: Negative.   ?Blood pressure  114/71, pulse 95, temperature 97.9 ?F (36.6 ?C), temperature source Oral, resp. rate 20, height '5\' 4"'$  (1.626 m), weight 98.9 kg, SpO2 99 %. Body mass index is 37.42 kg/m?. ? ?Mental Status Per Nursing Assessment::   ?On Admission:  Self-harm thoughts ? ?Demographic Factors:  ?Caucasian and Low socioeconomic status ? ?Loss Factors: ?NA ? ?Historical Factors: ?Impulsivity ? ?Risk Reduction Factors:   ?NA ? ?Continued Clinical Symptoms:  ?More than one psychiatric diagnosis ? ?Cognitive Features That Contribute To Risk:  ?None   ? ?Suicide Risk:  ?Mild:  There are no identifiable suicide plans, no associated intent, mild dysphoria and related symptoms, good self-control (both objective and subjective assessment), few other risk factors, and identifiable protective factors, including available and accessible social support. ? ? ? Follow-up Information   ? ? [facility] Follow up on 04/26/2021.   ?Why: Please continue with this provider's [employer] who will meet with you on 04/26/21 after 2:00pm.  You also have appointments for medication management services on 04/27/21 at 9:00 am, and with your CST at 9:30 am. ?Contact information: ?[address] ?[place] [address] ?[phone] ? ? ?  ?  ? ?  ?  ? ?  ? ?Plan Of Care/Follow-up recommendations:  ?Labs were reviewed with the patient, and abnormal results were discussed with the patient. ? ?The patient is able to verbalize their individual safety plan to this provider. ? ?# It is recommended to the patient to continue psychiatric medications as prescribed, after discharge from the hospital.   ? ?# It is recommended to the patient to follow up with your outpatient psychiatric provider and PCP. ? ?# It was discussed with  the patient, the impact of alcohol, drugs, tobacco have been there overall psychiatric and medical wellbeing, and total abstinence from substance use was recommended the patient.ed. ? ?# Prescriptions provided or sent directly to preferred  pharmacy at discharge. Patient agreeable to plan. Given opportunity to ask questions. Appears to feel comfortable with discharge.  ?  ?# In the event of worsening symptoms, the patient is instructed to call the cr

## 2021-04-26 NOTE — Progress Notes (Signed)
?  [facility] Adult Case Management Discharge Plan : ? ?Will you be returning to the same living situation after discharge:  Yes,  Home  ?At discharge, do you have transportation home?: Yes,  Taxi  ?Do you have the ability to pay for your medications: Yes,  Medicaid  ? ?Release of information consent forms completed and in the chart;  Patient's signature needed at discharge. ? ?Patient to Follow up at: ? Follow-up Information   ? ? [facility] Follow up on 04/26/2021.   ?Why: Please continue with this provider's [employer] who will meet with you on 04/26/21 after 2:00pm.  You also have appointments for medication management services on 04/27/21 at 9:00 am, and with your CST at 9:30 am. ?Contact information: ?[address] ?[place] [address] ?[phone] ? ? ?  ?  ? ?  ?  ? ?  ? ? ?Next level of care provider has access to [facility] ? ?Safety Planning and Suicide Prevention discussed: Yes,  with patient and CST Team Member  ? ?  ? ?Has patient been referred to the Quitline?: N/A patient is not a smoker ? ?Patient has been referred for addiction treatment: N/A ? ?Darleen Crocker, LCSWA ?04/26/2021, 9:20 AM ?

## 2021-04-27 DIAGNOSIS — F4310 Post-traumatic stress disorder, unspecified: Secondary | ICD-10-CM | POA: Diagnosis not present

## 2021-04-27 DIAGNOSIS — F603 Borderline personality disorder: Secondary | ICD-10-CM | POA: Diagnosis not present

## 2021-04-27 DIAGNOSIS — F250 Schizoaffective disorder, bipolar type: Secondary | ICD-10-CM | POA: Diagnosis not present

## 2021-05-01 ENCOUNTER — Encounter (HOSPITAL_COMMUNITY): Payer: Self-pay

## 2021-05-01 ENCOUNTER — Other Ambulatory Visit: Payer: Self-pay

## 2021-05-01 ENCOUNTER — Ambulatory Visit: Payer: PPO | Admitting: Family

## 2021-05-01 ENCOUNTER — Emergency Department (HOSPITAL_COMMUNITY)
Admission: EM | Admit: 2021-05-01 | Discharge: 2021-05-02 | Disposition: A | Discharge status: Home / Self Care | Payer: PPO | Attending: Emergency Medicine | Admitting: Emergency Medicine

## 2021-05-01 ENCOUNTER — Ambulatory Visit (INDEPENDENT_AMBULATORY_CARE_PROVIDER_SITE_OTHER)
Admission: EM | Admit: 2021-05-01 | Discharge: 2021-05-01 | Disposition: A | Discharge status: Home / Self Care | Payer: PPO | Source: Home / Self Care

## 2021-05-01 DIAGNOSIS — S61511A Laceration without foreign body of right wrist, initial encounter: Secondary | ICD-10-CM | POA: Diagnosis not present

## 2021-05-01 DIAGNOSIS — Z79899 Other long term (current) drug therapy: Secondary | ICD-10-CM | POA: Insufficient documentation

## 2021-05-01 DIAGNOSIS — R45851 Suicidal ideations: Secondary | ICD-10-CM

## 2021-05-01 DIAGNOSIS — S61512A Laceration without foreign body of left wrist, initial encounter: Secondary | ICD-10-CM | POA: Diagnosis not present

## 2021-05-01 DIAGNOSIS — X789XXA Intentional self-harm by unspecified sharp object, initial encounter: Secondary | ICD-10-CM | POA: Diagnosis not present

## 2021-05-01 DIAGNOSIS — F419 Anxiety disorder, unspecified: Secondary | ICD-10-CM | POA: Insufficient documentation

## 2021-05-01 DIAGNOSIS — F603 Borderline personality disorder: Secondary | ICD-10-CM | POA: Insufficient documentation

## 2021-05-01 DIAGNOSIS — F840 Autistic disorder: Secondary | ICD-10-CM | POA: Insufficient documentation

## 2021-05-01 DIAGNOSIS — F333 Major depressive disorder, recurrent, severe with psychotic symptoms: Secondary | ICD-10-CM | POA: Insufficient documentation

## 2021-05-01 DIAGNOSIS — Z20822 Contact with and (suspected) exposure to covid-19: Secondary | ICD-10-CM | POA: Insufficient documentation

## 2021-05-01 DIAGNOSIS — R58 Hemorrhage, not elsewhere classified: Secondary | ICD-10-CM | POA: Diagnosis not present

## 2021-05-01 DIAGNOSIS — S6991XA Unspecified injury of right wrist, hand and finger(s), initial encounter: Secondary | ICD-10-CM | POA: Diagnosis present

## 2021-05-01 LAB — COMPREHENSIVE METABOLIC PANEL
ALT: 24 U/L (ref 0–44)
AST: 18 U/L (ref 15–41)
Albumin: 4.4 g/dL (ref 3.5–5.0)
Alkaline Phosphatase: 70 U/L (ref 38–126)
Anion gap: 9 (ref 5–15)
BUN: 9 mg/dL (ref 6–20)
CO2: 21 mmol/L — ABNORMAL LOW (ref 22–32)
Calcium: 9.5 mg/dL (ref 8.9–10.3)
Chloride: 107 mmol/L (ref 98–111)
Creatinine, Ser: 0.73 mg/dL (ref 0.44–1.00)
GFR, Estimated: >60 mL/min (ref >60)
Glucose, Bld: 96 mg/dL (ref 70–99)
Potassium: 3.7 mmol/L (ref 3.5–5.1)
Sodium: 137 mmol/L (ref 135–145)
Total Bilirubin: 0.6 mg/dL (ref 0.3–1.2)
Total Protein: 7.8 g/dL (ref 6.5–8.1)

## 2021-05-01 LAB — RESP PANEL BY RT-PCR (FLU A&B, COVID) ARPGX2
Influenza A by PCR: NEGATIVE
Influenza B by PCR: NEGATIVE
SARS Coronavirus 2 by RT PCR: NEGATIVE

## 2021-05-01 LAB — LIPID PANEL
Cholesterol: 245 mg/dL — ABNORMAL HIGH (ref 0–200)
HDL: 46 mg/dL (ref >40)
LDL Cholesterol: 165 mg/dL — ABNORMAL HIGH (ref 0–99)
Total CHOL/HDL Ratio: 5.3 RATIO
Triglycerides: 169 mg/dL — ABNORMAL HIGH (ref <150)
VLDL: 34 mg/dL (ref 0–40)

## 2021-05-01 LAB — CBC WITH DIFFERENTIAL/PLATELET
Abs Immature Granulocytes: 0.07 10*3/uL (ref 0.00–0.07)
Basophils Absolute: 0.1 10*3/uL (ref 0.0–0.1)
Basophils Relative: 0 %
Eosinophils Absolute: 0.1 10*3/uL (ref 0.0–0.5)
Eosinophils Relative: 1 %
HCT: 41.9 % (ref 36.0–46.0)
Hemoglobin: 13.9 g/dL (ref 12.0–15.0)
Immature Granulocytes: 1 %
Lymphocytes Relative: 27 %
Lymphs Abs: 3.4 10*3/uL (ref 0.7–4.0)
MCH: 26.3 pg (ref 26.0–34.0)
MCHC: 33.2 g/dL (ref 30.0–36.0)
MCV: 79.4 fL — ABNORMAL LOW (ref 80.0–100.0)
Monocytes Absolute: 0.7 10*3/uL (ref 0.1–1.0)
Monocytes Relative: 5 %
Neutro Abs: 8.4 10*3/uL — ABNORMAL HIGH (ref 1.7–7.7)
Neutrophils Relative %: 66 %
Platelets: 351 10*3/uL (ref 150–400)
RBC: 5.28 MIL/uL — ABNORMAL HIGH (ref 3.87–5.11)
RDW: 13.4 % (ref 11.5–15.5)
WBC: 12.7 10*3/uL — ABNORMAL HIGH (ref 4.0–10.5)
nRBC: 0 % (ref 0.0–0.2)

## 2021-05-01 LAB — TSH: TSH: 2.572 u[IU]/mL (ref 0.350–4.500)

## 2021-05-01 LAB — POC SARS CORONAVIRUS 2 AG -  ED: SARS Coronavirus 2 Ag: NEGATIVE — NL

## 2021-05-01 LAB — ETHANOL: Alcohol, Ethyl (B): <10 mg/dL (ref <10)

## 2021-05-01 LAB — POC SARS CORONAVIRUS 2 AG: SARSCOV2ONAVIRUS 2 AG: NEGATIVE

## 2021-05-01 MED ORDER — CARIPRAZINE HCL 3 MG PO CAPS
3.0000 mg | ORAL_CAPSULE | Freq: Every day | ORAL | Status: DC
  Administered 2021-05-01: 3 mg via ORAL

## 2021-05-01 MED ORDER — ESCITALOPRAM OXALATE 10 MG PO TABS
10.0000 mg | ORAL_TABLET | Freq: Every day | ORAL | Status: DC
  Administered 2021-05-01: 10 mg via ORAL
  Filled 2021-05-01: qty 1

## 2021-05-01 MED ORDER — ONDANSETRON HCL 4 MG PO TABS
4.0000 mg | ORAL_TABLET | Freq: Two times a day (BID) | ORAL | Status: DC
  Administered 2021-05-01: 4 mg via ORAL
  Filled 2021-05-01: qty 1

## 2021-05-01 MED ORDER — MAGNESIUM HYDROXIDE 400 MG/5ML PO SUSP: 30.0000 mL | Freq: Every day | ORAL | Status: DC | PRN

## 2021-05-01 MED ORDER — MELATONIN 5 MG PO TABS
5.0000 mg | ORAL_TABLET | Freq: Every day | ORAL | Status: DC
  Administered 2021-05-01: 5 mg via ORAL
  Filled 2021-05-01: qty 1

## 2021-05-01 MED ORDER — ALUM & MAG HYDROXIDE-SIMETH 200-200-20 MG/5ML PO SUSP
30.0000 mL | ORAL | Status: DC | PRN
  Filled 2021-05-01: qty 30

## 2021-05-01 MED ORDER — PANTOPRAZOLE SODIUM 40 MG PO TBEC
40.0000 mg | DELAYED_RELEASE_TABLET | Freq: Every day | ORAL | Status: DC
  Administered 2021-05-01: 40 mg via ORAL
  Filled 2021-05-01: qty 1

## 2021-05-01 MED ORDER — PROPRANOLOL HCL 10 MG PO TABS
10.0000 mg | ORAL_TABLET | Freq: Two times a day (BID) | ORAL | Status: DC
  Administered 2021-05-01: 10 mg via ORAL
  Filled 2021-05-01: qty 1

## 2021-05-01 MED ORDER — ACETAMINOPHEN 325 MG PO TABS: 650.0000 mg | ORAL_TABLET | Freq: Four times a day (QID) | ORAL | Status: DC | PRN

## 2021-05-01 MED ORDER — HYDROXYZINE HCL 25 MG PO TABS
25.0000 mg | ORAL_TABLET | Freq: Three times a day (TID) | ORAL | Status: DC | PRN
  Administered 2021-05-01: 25 mg via ORAL
  Filled 2021-05-01: qty 1

## 2021-05-01 MED ORDER — GABAPENTIN 100 MG PO CAPS: 200.0000 mg | ORAL_CAPSULE | Freq: Three times a day (TID) | ORAL | Status: DC

## 2021-05-01 MED ORDER — TRAZODONE HCL 100 MG PO TABS: 100.0000 mg | ORAL_TABLET | Freq: Every day | ORAL | Status: DC

## 2021-05-01 MED ORDER — GABAPENTIN 100 MG PO CAPS
200.0000 mg | ORAL_CAPSULE | Freq: Three times a day (TID) | ORAL | Status: DC
  Administered 2021-05-01: 200 mg via ORAL
  Filled 2021-05-01: qty 2

## 2021-05-01 NOTE — ED Triage Notes (Signed)
Pt BIB EMS, pt has self inflicted bilateral wrist lacerations. Endorses SI. Hx PTSD, bipolar, schizoaffective ? ? ? ? ?
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
## 2021-05-01 NOTE — ED Notes (Signed)
P A&O x 4, presents with suicidal ideations, plan to overdose and then stab herself in throat with a knife.  Pt anxious, with pressured speech, but cooperative.  Comfort measures given.  Monitoring for safety. ?

## 2021-05-01 NOTE — ED Notes (Signed)
Pt was given a muffin and juice for breakfast.  

## 2021-05-01 NOTE — BH Assessment (Signed)
[facility] Assessment Progress Note ?  ?Per Ricky Ala, NP, this pt does not require psychiatric hospitalization at this time.  Pt is psychiatrically cleared.  Discharge instructions advise pt to continue treatment with the [facility].  Ludger Nutting has been notified. ? ?[name], MA ?Triage Specialist ?[phone]  ?

## 2021-05-01 NOTE — ED Notes (Signed)
Patient stated she felt like hurting herself.  She said she would use her fingernails if she had to ?

## 2021-05-01 NOTE — Discharge Instructions (Addendum)
Take all medications as prescribed. ?Keep all follow-up appointments as scheduled.  ?Do not consume alcohol or use illegal drugs while on prescription medications. ?Report any adverse effects from your medications to your primary care provider promptly.  ?In the event of recurrent symptoms or worsening symptoms, call 911, a crisis hotline, or go to the nearest emergency department for evaluation.   ? ?For your behavioral health needs you are advised to continue treatment with the [facility]: ? ?   [facility] ?[address]. ?[place] [address] ?[phone] ? ?

## 2021-05-01 NOTE — ED Notes (Signed)
Patient received an after visit summary with follow up instructions and community resources. All belongings from [facility] locker received. Taxi voucher provided to patient  away home. ?

## 2021-05-01 NOTE — ED Provider Notes (Addendum)
FBC/OBS ASAP Discharge Summary ? ?Date and Time: 05/01/2021 12:11 PM  ?Name: [name]  ?MRN:  [id number]  ? ?Discharge Diagnoses:  ?Final diagnoses:  ?Severe episode of recurrent major depressive disorder, with psychotic features ([place])  ?Borderline personality disorder ([place])  ?Autism  ? ? ?Subjective: [name] is a 31 year old female well-known to this therapy reporting " my medications are not working."  States she continues to experience auditory hallucinations.  Patient was recently discharged with medication adjustments at previous inpatient admission 5 days prior.  She is denying suicidal or homicidal ideations.  Denies auditory visual hallucinations.   ? ?[name] stated " I need to go back to behavioral health and see Dr. [name], she was really helpful."  Patient was offered inpatient admission. NP contacted Behavioral health - patient was declined at this time.  Patient to consider a different hospital.  ? ?[name] reports she is unable to go to  another hospital due to insurance. Stated that she was supposed to follow-up with [facility]. States she has a follow-up appointment with her CST provider which was rescheduled for 05/02/2021. Patient is requesting transportation back home.  ? ? ? ? ?Stay Summary: per previous admission assessment note-"[name] is a 31 y.o Caucasian female with past mental health diagnoses of  MDD, bipolar d/o, schizoaffective d/o, GAD & autism who presented to the [facility] with paranoia & suicidal ideations with a plan to use a knife or take pills to kill herself. Pt's husband completed suicide in March 2019, and pt is paranoid that his brother is out to get her. As per chart review, pt has had multiple visits to the [facility] health urgent care [facility]), and to this [facility]. Pt was transferred to this [facility] for treatment and stabilization of her mood."  ? ?Total Time spent with patient: 15 minutes ?Past Psychiatric History:  ?Past Medical History:   ?Past Medical History:  ?Diagnosis Date  ? Acid reflux   ? Anxiety   ? Asthma   ? last attack 03/13/15 or 03/14/15  ? Autism   ? Carrier of fragile X syndrome   ? Chronic constipation   ? Depression   ? Drug-seeking behavior   ? Essential tremor   ? Headache   ? Overdose of acetaminophen 07/2017  ? and other meds  ? Personality disorder ([place])   ? Schizo-affective psychosis ([place])   ? Schizoaffective disorder, bipolar type ([employer])   ? Seizures ([place])   ? Last seizure December 2017  ? Sleep apnea   ?  ?Past Surgical History:  ?Procedure Laterality Date  ? MOUTH SURGERY  2009 or 2010  ? ?Family History:  ?Family History  ?Problem Relation Age of Onset  ? Mental illness Father   ? Asthma Father   ? PDD Brother   ? Seizures Brother   ? ?Family Psychiatric History:  ?Social History:  ?Social History  ? ?Substance and Sexual Activity  ?Alcohol Use No  ? Alcohol/week: 1.0 standard drink  ? Types: 1 Standard drinks or equivalent per week  ? Comment: denies at this time  ?   ?Social History  ? ?Substance and Sexual Activity  ?Drug Use No  ? Comment: History of cocaine use at age 41 for 4 months  ?  ?Social History  ? ?Socioeconomic History  ? Marital status: Widowed  ?  Spouse name: Not on file  ? Number of children: 0  ? Years of education: Not on file  ? Highest education  level: Not on file  ?Occupational History  ? Occupation: disability  ?Tobacco Use  ? Smoking status: Former  ?  Packs/day: 0.00  ?  Types: Cigarettes  ? Smokeless tobacco: Never  ? Tobacco comments:  ?  Smoked for 2  years age [age]  ?Vaping Use  ? Vaping Use: Never used  ?Substance and Sexual Activity  ? Alcohol use: No  ?  Alcohol/week: 1.0 standard drink  ?  Types: 1 Standard drinks or equivalent per week  ?  Comment: denies at this time  ? Drug use: No  ?  Comment: History of cocaine use at age 72 for 4 months  ? Sexual activity: Yes  ?  Birth control/protection: None  ?Other Topics Concern  ? Not on file  ?Social History Narrative  ? Marital status: Widowed   ?    Children: daughter  ?    Lives: with boyfriend, in two story home  ?    Employment:  Disability  ?    Tobacco: quit smoking; smoked for two years.  ?    Alcohol ;none  ?    Drugs: none  ? Has not traveled outside of the country.  ? Right handed   ?     ? ?Social Determinants of Health  ? ?Financial Resource Strain: Not on file  ?Food Insecurity: Not on file  ?Transportation Needs: Not on file  ?Physical Activity: Not on file  ?Stress: Not on file  ?Social Connections: Not on file  ? ?SDOH:  ?SDOH Screenings  ? ?Alcohol Screen: Low Risk   ? Last Alcohol Screening Score (AUDIT): 0  ?Depression (PHQ2-9): Medium Risk  ? PHQ-2 Score: 17  ?Financial Resource Strain: Not on file  ?Food Insecurity: Not on file  ?Housing: Not on file  ?Physical Activity: Not on file  ?Social Connections: Not on file  ?Stress: Not on file  ?Tobacco Use: Medium Risk  ? Smoking Tobacco Use: Former  ? Smokeless Tobacco Use: Never  ? Passive Exposure: Not on file  ?Transportation Needs: Not on file  ? ? ?Tobacco Cessation:  N/A, patient does not currently use tobacco products ? ?Current Medications:  ?Current Facility-Administered Medications  ?Medication Dose Route Frequency Provider Last Rate Last Admin  ? acetaminophen (TYLENOL) tablet 650 mg  650 mg Oral Q6H PRN Ajibola, Ene A, NP      ? alum & mag hydroxide-simeth (MAALOX/MYLANTA) 200-200-20 MG/5ML suspension 30 mL  30 mL Oral Q4H PRN Ajibola, Ene A, NP      ? cariprazine (VRAYLAR) capsule 3 mg  3 mg Oral Daily Ajibola, Ene A, NP   3 mg at 05/01/21 0914  ? escitalopram (LEXAPRO) tablet 10 mg  10 mg Oral Daily Ajibola, Ene A, NP   10 mg at 05/01/21 0350  ? gabapentin (NEURONTIN) capsule 200 mg  200 mg Oral TID Ajibola, Ene A, NP   200 mg at 05/01/21 0913  ? hydrOXYzine (ATARAX) tablet 25 mg  25 mg Oral TID PRN Ajibola, Ene A, NP   25 mg at 05/01/21 0938  ? magnesium hydroxide (MILK OF MAGNESIA) suspension 30 mL  30 mL Oral Daily PRN Ajibola, Ene A, NP      ? melatonin tablet 5 mg  5 mg  Oral QHS Ajibola, Ene A, NP   5 mg at 05/01/21 0233  ? ondansetron (ZOFRAN) tablet 4 mg  4 mg Oral Q12H [place], NP   4 mg at 05/01/21 1829  ? pantoprazole (PROTONIX) EC tablet  40 mg  40 mg Oral Daily Ajibola, Ene A, NP   40 mg at 05/01/21 0913  ? propranolol (INDERAL) tablet 10 mg  10 mg Oral BID Ajibola, Ene A, NP   10 mg at 05/01/21 0913  ? traZODone (DESYREL) tablet 100 mg  100 mg Oral QHS Ajibola, Ene A, NP      ? ?Current Outpatient Medications  ?Medication Sig Dispense Refill  ? albuterol (VENTOLIN HFA) 108 (90 Base) MCG/ACT inhaler Inhale 2 puffs into the lungs every 6 (six) hours as needed for wheezing or shortness of breath.    ? cariprazine (VRAYLAR) 3 MG capsule Take 1 capsule (3 mg total) by mouth daily. 30 capsule 0  ? escitalopram (LEXAPRO) 10 MG tablet Take 1 tablet (10 mg total) by mouth daily. 30 tablet 0  ? gabapentin (NEURONTIN) 100 MG capsule Take 2 capsules (200 mg total) by mouth 3 (three) times daily. 180 capsule 0  ? hydrOXYzine (VISTARIL) 25 MG capsule Take 25 mg by mouth 2 (two) times daily as needed for anxiety.    ? melatonin 5 MG TABS Take 1 tablet (5 mg total) by mouth at bedtime. 30 tablet 0  ? pantoprazole (PROTONIX) 40 MG tablet TAKE 1 TABLET BY MOUTH DAILY (Patient taking differently: 40 mg daily.) 30 tablet 0  ? propranolol (INDERAL) 10 MG tablet Take 1 tablet (10 mg total) by mouth 2 (two) times daily. 60 tablet 0  ? traZODone (DESYREL) 100 MG tablet Take 1 tablet (100 mg total) by mouth at bedtime. 30 tablet 0  ? ? ?PTA Medications: (Not in a hospital admission) ? ? ?Musculoskeletal  ?Strength & Muscle Tone: within normal limits ?Gait & Station: normal ?Patient leans: N/A ? ?Psychiatric Specialty Exam  ?Presentation  ?General Appearance: Appropriate for Environment ? ?Eye Contact:Good ? ?Speech:Clear and Coherent ? ?Speech Volume:Decreased ? ?Handedness:Right ? ? ?Mood and Affect  ?Mood:Anxious ? ?Affect:Congruent ? ? ?Thought Process  ?Thought  Processes:Coherent ? ?Descriptions of Associations:Intact ? ?Orientation:Full (Time, Place and Person) ? ?Thought Content:Logical ? Diagnosis of Schizophrenia or Schizoaffective disorder in past: Yes ? Duration of Psychotic Sym

## 2021-05-01 NOTE — ED Provider Notes (Signed)
Behavioral Health Admission H&P ?(FBC & OBS) ? ?Date: 05/01/21 ?Patient Name: [name] ?MRN: [id number] ?Chief Complaint:  ?Chief Complaint  ?Patient presents with  ? Suicidal  ? Manic  ?   ? ?Diagnoses:  ?Final diagnoses:  ?None  ? ? ?HPI: [name] is a 31 year old female with psychiatric history of schizoaffective disorder, bipolar type, depression, anxiety, and suicidal ideation.  Patient presented voluntarily to [facility] via law enforcement reporting suicidal ideation. ? ?This provider met with patient face-to-face and reviewed her chart.  On assessment patient is alert and oriented x4.  Patient is restless and anxious but cooperative.  Her speech is pressured; she has good eye contact.  Her mood is anxious with congruent affect.  Her thought process is coherent and relevant. ? ?Patient reports that she contacted law enforcement tonight because "I was going to do something crazy."  She reports that she is suicidal with plans to "overdose and stab myself in the neck."  She reports that she was recently discharged from inpatient psychiatry at [facility] on 04/26/21 and she feels that her medications are not working effectively.  She reports that she has been experiencing mood swings since discharge.  Patient states "one moment I am talking and laughing with everybody the next, I am crying and feeling very depressed." ?She states that she is unable to contract for safety and would like to be readmitted to inpatient psychiatry.  ? ?Patient denies homicidal ideation, paranoia, and substance abuse.  Patient is endorsing visual hallucination of "seeing bugs" and auditory hallucinations of "voices from the radio saying kill yourself." ? ? PHQ 2-9:  ?Flowsheet Row ED from 02/03/2020 in [facility] ED from 07/26/2019 in [facility] Office Visit from 08/20/2016 in [place] for [facility]  ?Thoughts that you would be better off  dead, or of hurting yourself in some way Nearly every day  [Phreesia 02/03/2020] Nearly every day Not at all  ?PHQ-9 Total Score 27 15 1   ? ?  ?  ?Flowsheet Row Admission (Discharged) from 04/21/2021 in [facility] ED from 04/20/2021 in [facility] ED from 03/23/2021 in [facility] DEPT  ?C-SSRS RISK CATEGORY High Risk High Risk No Risk  ? ?  ?  ? ?Total Time spent with patient: 15 minutes ? ?Musculoskeletal  ?Strength & Muscle Tone: within normal limits ?Gait & Station: normal ?Patient leans: Right ? ?Psychiatric Specialty Exam  ?Presentation ?General Appearance: Appropriate for Environment ? ?Eye Contact:Good ? ?Speech:Clear and Coherent; Pressured ? ?Speech Volume:Normal ? ?Handedness:Right ? ? ?Mood and Affect  ?Mood:Anxious ? ?Affect:Congruent ? ? ?Thought Process  ?Thought Processes:Coherent ? ?Descriptions of Associations:Intact ? ?Orientation:Full (Time, Place and Person) ? ?Thought Content:WDL ? Diagnosis of Schizophrenia or Schizoaffective disorder in past: Yes ? Duration of Psychotic Symptoms: Greater than six months ? ?Hallucinations:Hallucinations: Auditory; Visual ?Description of Auditory Hallucinations: "voices from radio saying kill yourself" ?Description of Visual Hallucinations: "bugs" ? ?Ideas of Reference:None ? ?Suicidal Thoughts:Suicidal Thoughts: Yes, Active ?SI Active Intent and/or Plan: With Plan; With Intent ? ?Homicidal Thoughts:Homicidal Thoughts: No ? ? ?Sensorium  ?Memory:Immediate Good; Remote Good; Recent Good ? ?Judgment:Fair ? ?Insight:Good ? ? ?Executive Functions  ?Concentration:Fair ? ?Attention Span:Poor ? ?Recall:Good ? ?Fund of [place] ? ?Language:Good ? ? ?Psychomotor Activity  ?Psychomotor Activity:Psychomotor Activity: Restlessness ? ? ?Assets  ?Assets:Communication Skills; Desire for Improvement; Housing; Social Support; Physical Health ? ? ?Sleep  ?Sleep:Sleep: Fair ?Number  of  Hours of Sleep: 5 ? ? ?Nutritional Assessment (For OBS and FBC admissions only) ?Has the patient had a weight loss or gain of 10 pounds or more in the last 3 months?: No ?Has the patient had a decrease in food intake/or appetite?: No ?Does the patient have dental problems?: No ?Does the patient have eating habits or behaviors that may be indicators of an eating disorder including binging or inducing vomiting?: No ?Has the patient recently lost weight without trying?: 0 ?Has the patient been eating poorly because of a decreased appetite?: 0 ?Malnutrition Screening Tool Score: 0 ? ? ? ?Physical Exam ?Vitals and nursing note reviewed.  ?Constitutional:   ?   General: She is not in acute distress. ?   Appearance: She is well-developed.  ?HENT:  ?   Head: Normocephalic and atraumatic.  ?Eyes:  ?   Conjunctiva/sclera: Conjunctivae normal.  ?Cardiovascular:  ?   Rate and Rhythm: Normal rate.  ?Pulmonary:  ?   Effort: Pulmonary effort is normal. No respiratory distress.  ?Abdominal:  ?   Palpations: Abdomen is soft.  ?   Tenderness: There is no abdominal tenderness.  ?Musculoskeletal:     ?   General: No swelling.  ?   Cervical back: Neck supple.  ?Skin: ?   General: Skin is warm and dry.  ?   Capillary Refill: Capillary refill takes less than 2 seconds.  ?Neurological:  ?   Mental Status: She is alert and oriented to person, place, and time.  ?Psychiatric:     ?   Attention and Perception: Attention normal. She perceives auditory and visual hallucinations.     ?   Mood and Affect: Mood normal.     ?   Speech: Speech is rapid and pressured.     ?   Behavior: Behavior normal. Behavior is cooperative.     ?   Thought Content: Thought content includes suicidal ideation. Thought content includes suicidal plan.     ?   Cognition and Memory: Cognition normal.  ? ?Review of Systems  ?Constitutional: Negative.   ?HENT: Negative.    ?Eyes: Negative.   ?Respiratory: Negative.    ?Cardiovascular: Negative.   ?Gastrointestinal:  Negative.   ?Genitourinary: Negative.   ?Musculoskeletal: Negative.   ?Skin: Negative.   ?Neurological: Negative.   ?Endo/Heme/Allergies: Negative.   ?Psychiatric/Behavioral:  Positive for hallucinations and suicidal ideas. The patient is nervous/anxious.   ? ?Blood pressure 112/83, pulse 91, temperature 98.2 ?F (36.8 ?C), temperature source Oral, resp. rate 18, SpO2 99 %. There is no height or weight on file to calculate BMI. ? ?Past Psychiatric History: Schizoaffective disorder bipolar type, depression, anxiety, suicidal ideation ? ?Is the patient at risk to self? Yes  ?Has the patient been a risk to self in the past 6 months? Yes .    ?Has the patient been a risk to self within the distant past? Yes   ?Is the patient a risk to others? No   ?Has the patient been a risk to others in the past 6 months? No   ?Has the patient been a risk to others within the distant past? No  ? ?Past Medical History:  ?Past Medical History:  ?Diagnosis Date  ? Acid reflux   ? Anxiety   ? Asthma   ? last attack 03/13/15 or 03/14/15  ? Autism   ? Carrier of fragile X syndrome   ? Chronic constipation   ? Depression   ? Drug-seeking behavior   ?  Essential tremor   ? Headache   ? Overdose of acetaminophen 07/2017  ? and other meds  ? Personality disorder ([place])   ? Schizo-affective psychosis ([place])   ? Schizoaffective disorder, bipolar type ([place])   ? Seizures ([place])   ? Last seizure December 2017  ? Sleep apnea   ?  ?Past Surgical History:  ?Procedure Laterality Date  ? MOUTH SURGERY  2009 or 2010  ? ? ?Family History:  ?Family History  ?Problem Relation Age of Onset  ? Mental illness Father   ? Asthma Father   ? PDD Brother   ? Seizures Brother   ? ? ?Social History:  ?Social History  ? ?Socioeconomic History  ? Marital status: Widowed  ?  Spouse name: Not on file  ? Number of children: 0  ? Years of education: Not on file  ? Highest education level: Not on file  ?Occupational History  ? Occupation: disability  ?Tobacco Use  ? Smoking status:  Former  ?  Packs/day: 0.00  ?  Types: Cigarettes  ? Smokeless tobacco: Never  ? Tobacco comments:  ?  Smoked for 2  years age [age]  ?Vaping Use  ? Vaping Use: Never used  ?Substance and Sexual Activity  ? Alc

## 2021-05-01 NOTE — ED Notes (Signed)
Pt presents with anxious mood, affect labile. [name] is noted to have pressured speech, stating she '' I feel manic, I was laughing then I was crying with my ex when I came in here, and I'm all over the place, I took myself here because I know when i'm not right and need to get my meds adjusted. I'm not so sure about the Vraylar, or the lexapro maybe it needs to be upped. I haven't been sleeping but I did sleep better last night. I was seeing bugs and just nauseated and sweating. The only thing working for me right now is the neurontin. '' Patient reports VH of seeing bugs and reports she is still having suicidal ideation, but is able to contract for safety with Probation officer at this time stating '' I will try''  ?

## 2021-05-02 DIAGNOSIS — S61511A Laceration without foreign body of right wrist, initial encounter: Secondary | ICD-10-CM | POA: Diagnosis not present

## 2021-05-02 MED ORDER — ONDANSETRON 4 MG PO TBDP
4.0000 mg | ORAL_TABLET | Freq: Once | ORAL | Status: AC
  Administered 2021-05-02: 4 mg via ORAL
  Filled 2021-05-02: qty 1

## 2021-05-02 MED ORDER — IBUPROFEN 200 MG PO TABS
400.0000 mg | ORAL_TABLET | Freq: Four times a day (QID) | ORAL | Status: DC | PRN
  Administered 2021-05-02: 400 mg via ORAL
  Filled 2021-05-02: qty 2

## 2021-05-02 MED ORDER — MELATONIN 5 MG PO TABS
5.0000 mg | ORAL_TABLET | Freq: Every day | ORAL | Status: DC
  Administered 2021-05-02: 5 mg via ORAL
  Filled 2021-05-02: qty 1

## 2021-05-02 MED ORDER — CARIPRAZINE HCL 1.5 MG PO CAPS
3.0000 mg | ORAL_CAPSULE | Freq: Every day | ORAL | Status: DC
  Administered 2021-05-02: 3 mg via ORAL
  Filled 2021-05-02: qty 1
  Filled 2021-05-02: qty 2

## 2021-05-02 MED ORDER — GABAPENTIN 100 MG PO CAPS
200.0000 mg | ORAL_CAPSULE | Freq: Three times a day (TID) | ORAL | Status: DC
  Administered 2021-05-02: 200 mg via ORAL
  Filled 2021-05-02 (×2): qty 2

## 2021-05-02 MED ORDER — PANTOPRAZOLE SODIUM 40 MG PO TBEC
40.0000 mg | DELAYED_RELEASE_TABLET | Freq: Every day | ORAL | Status: DC
  Administered 2021-05-02: 40 mg via ORAL
  Filled 2021-05-02 (×2): qty 1

## 2021-05-02 MED ORDER — HYDROXYZINE HCL 25 MG PO TABS
25.0000 mg | ORAL_TABLET | Freq: Two times a day (BID) | ORAL | Status: DC | PRN
  Administered 2021-05-02: 25 mg via ORAL
  Filled 2021-05-02: qty 1

## 2021-05-02 MED ORDER — BACITRACIN ZINC 500 UNIT/GM EX OINT
TOPICAL_OINTMENT | Freq: Two times a day (BID) | CUTANEOUS | Status: DC
  Administered 2021-05-02: 1 via TOPICAL
  Filled 2021-05-02: qty 0.9

## 2021-05-02 MED ORDER — ALBUTEROL SULFATE HFA 108 (90 BASE) MCG/ACT IN AERS: 2.0000 | INHALATION_SPRAY | Freq: Four times a day (QID) | RESPIRATORY_TRACT | Status: DC | PRN

## 2021-05-02 MED ORDER — ESCITALOPRAM OXALATE 10 MG PO TABS
10.0000 mg | ORAL_TABLET | Freq: Every day | ORAL | Status: DC
  Administered 2021-05-02: 10 mg via ORAL
  Filled 2021-05-02 (×2): qty 1

## 2021-05-02 MED ORDER — HYDROXYZINE PAMOATE 25 MG PO CAPS: 25.0000 mg | ORAL_CAPSULE | Freq: Two times a day (BID) | ORAL | Status: DC | PRN

## 2021-05-02 MED ORDER — PROPRANOLOL HCL 20 MG PO TABS
10.0000 mg | ORAL_TABLET | Freq: Two times a day (BID) | ORAL | Status: DC
  Administered 2021-05-02: 10 mg via ORAL
  Filled 2021-05-02 (×3): qty 1

## 2021-05-02 MED ORDER — TRAZODONE HCL 100 MG PO TABS
100.0000 mg | ORAL_TABLET | Freq: Every day | ORAL | Status: DC
  Administered 2021-05-02: 100 mg via ORAL
  Filled 2021-05-02: qty 1

## 2021-05-02 MED ORDER — ACETAMINOPHEN 325 MG PO TABS: 650.0000 mg | ORAL_TABLET | Freq: Four times a day (QID) | ORAL | Status: DC | PRN

## 2021-05-02 NOTE — ED Notes (Signed)
No room in cabinets behind nurses station located on 9-12 side. Pts belongings were placed under the desk where 9-12 Hall B cabinet is.  ?

## 2021-05-02 NOTE — ED Notes (Signed)
Pt placed in ed room 23 for TTS eval. Pt calm and cooperative at this time.   ?

## 2021-05-02 NOTE — BH Assessment (Addendum)
@  0833, Clinician requested patient's nurse [name], RN) to set up the TTS machine for patient's initial TTS assessment. [name], RN responded that patient is in the hallway, will have to locate a room for patient.  ?

## 2021-05-02 NOTE — BH Assessment (Signed)
TTS clinician attempted to complete assessment. Per Caitlynn, RN, patient unable to complete at this time. TTS clinician will attempt at later time.  ?

## 2021-05-02 NOTE — Discharge Instructions (Signed)
For your behavioral health needs you are advised to continue treatment with the [facility]: ? ?   [facility] ?[address] ?[place] [address] ?[phone] ?

## 2021-05-02 NOTE — ED Notes (Signed)
Pt escorted by ED RN to vehicle of [employer] member.  Pt ambulated w/ steady gait w/out need for assistance. Pt and all of her personal belongings into private vehicle.  No belongings left in bed or at bedside upon dc.  Pt states understanding of dc instructions and provided resources.  Pt denies questions or concerns at time of dc. ?
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
## 2021-05-02 NOTE — ED Provider Notes (Signed)
?[facility] DEPT ?Provider Note ? ? ?CSN: [id number] ?Arrival date & time: 05/01/21  2324 ? ?  ? ?History ? ?Chief Complaint  ?Patient presents with  ? Suicidal  ? ? ?Shaylen Nephew is a 31 y.o. female. ? ?9 yo F here with reported suicide attempt and multiple abrasions, superficial lacerations to both wrists. Was going to 'finish it off' with pills but her ex found her and brought her here.  ? ? ? ?  ? ?Home Medications ?Prior to Admission medications   ?Medication Sig Start Date End Date Taking? Authorizing Provider  ?albuterol (VENTOLIN HFA) 108 (90 Base) MCG/ACT inhaler Inhale 2 puffs into the lungs every 6 (six) hours as needed for wheezing or shortness of breath.    [provider]  ?cariprazine (VRAYLAR) 3 MG capsule Take 1 capsule (3 mg total) by mouth daily. 04/27/21 05/27/21  Massengill, Ovid Curd, MD  ?escitalopram (LEXAPRO) 10 MG tablet Take 1 tablet (10 mg total) by mouth daily. 04/27/21 05/27/21  Massengill, Ovid Curd, MD  ?gabapentin (NEURONTIN) 100 MG capsule Take 2 capsules (200 mg total) by mouth 3 (three) times daily. 04/26/21 05/26/21  Massengill, Ovid Curd, MD  ?hydrOXYzine (VISTARIL) 25 MG capsule Take 25 mg by mouth 2 (two) times daily as needed for anxiety.    [provider]  ?melatonin 5 MG TABS Take 1 tablet (5 mg total) by mouth at bedtime. 04/26/21 05/26/21  Massengill, Ovid Curd, MD  ?pantoprazole (PROTONIX) 40 MG tablet TAKE 1 TABLET BY MOUTH DAILY ?Patient taking differently: 40 mg daily. 04/26/21   Jeanie Sewer, NP  ?propranolol (INDERAL) 10 MG tablet Take 1 tablet (10 mg total) by mouth 2 (two) times daily. 04/26/21 05/26/21  Massengill, Ovid Curd, MD  ?traZODone (DESYREL) 100 MG tablet Take 1 tablet (100 mg total) by mouth at bedtime. 04/26/21 05/26/21  Janine Limbo, MD  ?   ? ?Allergies    ?Bee venom, Coconut flavor, Geodon [ziprasidone hcl], Haloperidol and related, Lithium, Oxycodone, Quetiapine, Shellfish allergy, Phenergan [promethazine  hcl], Prilosec [omeprazole], Sulfa antibiotics, Tegretol [carbamazepine], Prozac [fluoxetine], and Tape   ? ?Review of Systems   ?Review of Systems ? ?Physical Exam ?Updated Vital Signs ?BP 105/64 (BP Location: Left Arm)   Pulse 93   Temp 98.1 ?F (36.7 ?C) (Oral)   Resp 20   Ht '5\' 4"'$  (1.626 m)   Wt 98.9 kg   SpO2 97%   BMI 37.43 kg/m?  ?Physical Exam ?Vitals and nursing note reviewed.  ?Constitutional:   ?   Appearance: She is well-developed.  ?HENT:  ?   Head: Normocephalic and atraumatic.  ?   Nose: Nose normal. No congestion or rhinorrhea.  ?   Mouth/Throat:  ?   Mouth: Mucous membranes are moist.  ?   Pharynx: Oropharynx is clear.  ?Eyes:  ?   Pupils: Pupils are equal, round, and reactive to light.  ?Cardiovascular:  ?   Rate and Rhythm: Normal rate and regular rhythm.  ?Pulmonary:  ?   Effort: No respiratory distress.  ?   Breath sounds: No stridor.  ?Abdominal:  ?   General: Abdomen is flat. There is no distension.  ?Musculoskeletal:     ?   General: No swelling or tenderness. Normal range of motion.  ?   Cervical back: Normal range of motion.  ?Skin: ?   General: Skin is warm and dry.  ?   Comments: Multiple superficial lacerations and abrasions to both wrists. None requiring sutures. Will ask nursing to clean up, apply  bacitracin and possibly steri strips if needed.   ?Neurological:  ?   General: No focal deficit present.  ?   Mental Status: She is alert.  ? ? ?ED Results / Procedures / Treatments   ?Labs ?(all labs ordered are listed, but only abnormal results are displayed) ?Labs Reviewed - No data to display ? ?EKG ?None ? ?Radiology ?No results found. ? ?Procedures ?Procedures  ? ? ?Medications Ordered in ED ?Medications  ?bacitracin ointment (has no administration in time range)  ? ? ?ED Course/ Medical Decision Making/ A&P ?  ?                        ?Medical Decision Making ?Risk ?OTC drugs. ? ? ?Multiple superficial lacerations and abrasions to both wrists. None requiring sutures. Will ask  nursing to clean up, apply bacitracin and possibly steri strips if needed.  ? ?After this she is medically cleared for TTS consultation. Labs done yesterday and I don't see a strong need to repeat them again.  ? ? ?Final Clinical Impression(s) / ED Diagnoses ?Final diagnoses:  ?None  ? ? ?Rx / DC Orders ?ED Discharge Orders   ? ? None  ? ?  ? ? ?  ?Merrily Pew, MD ?05/02/21 0127 ? ?

## 2021-05-02 NOTE — ED Notes (Addendum)
Pt changed into burgundy scrubs, wanded by security, and belongings placed into patient belonging bag.  ?

## 2021-05-02 NOTE — ED Notes (Signed)
Wound care was provided to pt. Lacerations were wrapped after cleaning.  ?

## 2021-05-02 NOTE — BH Assessment (Signed)
[facility] Assessment Progress Note ?  ?Per Sheran Fava, NP , this voluntary pt does not require psychiatric hospitalization at this time.  Pt is psychiatrically cleared.  Discharge instructions advise pt to continue treatment with the [facility] Team.  EDP Regan Lemming, MD and pt's nurse, [name], have been notified.  At 09:56 I spoke to Butch Penny with the [facility].  She reports that someone will be here to pick pt up in 15 to 30 minutes. ? ?[name], MA ?Triage Specialist ?[phone] ? ?

## 2021-05-11 ENCOUNTER — Telehealth (HOSPITAL_COMMUNITY): Payer: Self-pay | Admitting: Dermatology

## 2021-05-11 NOTE — BH Assessment (Signed)
Care Management - Follow Up [facility] Discharges  ? ?Patient has been placed in an inpatient psychiatric hospital ([facility]) on 04-21-2021. ?

## 2021-05-18 DIAGNOSIS — R000 Tachycardia, unspecified: Secondary | ICD-10-CM | POA: Diagnosis not present

## 2021-05-19 DIAGNOSIS — E785 Hyperlipidemia, unspecified: Secondary | ICD-10-CM | POA: Diagnosis not present

## 2021-05-19 DIAGNOSIS — F22 Delusional disorders: Secondary | ICD-10-CM | POA: Diagnosis not present

## 2021-05-19 DIAGNOSIS — F840 Autistic disorder: Secondary | ICD-10-CM | POA: Diagnosis not present

## 2021-05-19 DIAGNOSIS — F250 Schizoaffective disorder, bipolar type: Secondary | ICD-10-CM | POA: Diagnosis not present

## 2021-05-26 DIAGNOSIS — R419 Unspecified symptoms and signs involving cognitive functions and awareness: Secondary | ICD-10-CM | POA: Diagnosis not present

## 2021-05-26 DIAGNOSIS — F4310 Post-traumatic stress disorder, unspecified: Secondary | ICD-10-CM | POA: Diagnosis not present

## 2021-05-26 DIAGNOSIS — F250 Schizoaffective disorder, bipolar type: Secondary | ICD-10-CM | POA: Diagnosis not present

## 2021-05-26 DIAGNOSIS — F603 Borderline personality disorder: Secondary | ICD-10-CM | POA: Diagnosis not present

## 2021-05-29 ENCOUNTER — Emergency Department (HOSPITAL_COMMUNITY)
Admission: EM | Admit: 2021-05-29 | Discharge: 2021-05-30 | Disposition: A | Discharge status: Home / Self Care | Payer: PPO | Attending: Emergency Medicine | Admitting: Emergency Medicine

## 2021-05-29 ENCOUNTER — Other Ambulatory Visit: Payer: Self-pay

## 2021-05-29 ENCOUNTER — Encounter (HOSPITAL_COMMUNITY): Payer: Self-pay

## 2021-05-29 DIAGNOSIS — F840 Autistic disorder: Secondary | ICD-10-CM | POA: Insufficient documentation

## 2021-05-29 DIAGNOSIS — R079 Chest pain, unspecified: Secondary | ICD-10-CM | POA: Diagnosis not present

## 2021-05-29 DIAGNOSIS — Y92210 Daycare center as the place of occurrence of the external cause: Secondary | ICD-10-CM | POA: Insufficient documentation

## 2021-05-29 DIAGNOSIS — M549 Dorsalgia, unspecified: Secondary | ICD-10-CM | POA: Diagnosis not present

## 2021-05-29 DIAGNOSIS — X58XXXA Exposure to other specified factors, initial encounter: Secondary | ICD-10-CM | POA: Diagnosis not present

## 2021-05-29 DIAGNOSIS — Z20822 Contact with and (suspected) exposure to covid-19: Secondary | ICD-10-CM | POA: Diagnosis not present

## 2021-05-29 DIAGNOSIS — Y990 Civilian activity done for income or pay: Secondary | ICD-10-CM | POA: Diagnosis not present

## 2021-05-29 DIAGNOSIS — R350 Frequency of micturition: Secondary | ICD-10-CM | POA: Insufficient documentation

## 2021-05-29 DIAGNOSIS — S39012A Strain of muscle, fascia and tendon of lower back, initial encounter: Secondary | ICD-10-CM | POA: Insufficient documentation

## 2021-05-29 DIAGNOSIS — M545 Low back pain, unspecified: Secondary | ICD-10-CM | POA: Diagnosis not present

## 2021-05-29 DIAGNOSIS — S3992XA Unspecified injury of lower back, initial encounter: Secondary | ICD-10-CM | POA: Diagnosis present

## 2021-05-29 DIAGNOSIS — J45909 Unspecified asthma, uncomplicated: Secondary | ICD-10-CM | POA: Insufficient documentation

## 2021-05-29 DIAGNOSIS — R059 Cough, unspecified: Secondary | ICD-10-CM | POA: Diagnosis not present

## 2021-05-29 NOTE — ED Provider Triage Note (Signed)
Emergency Medicine Provider Triage Evaluation Note ? ?[name] , a 31 y.o. female  was evaluated in triage.  Pt complains of back pain x 4 months (non traumatic), cold like symptoms and polyuria (x 4 days). She reports feeling warm but no measurable fevers. Endorses cough and nasal congestion. No known sick contacts. Denies burning with urination. 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 Reports allergy to Tylenol, states it makes her asthma worse and she gets a migraine   ? ?Review of Systems  ?Positive: As above  ?Negative: As above  ? ?Physical Exam  ?There were no vitals taken for this visit. ?Gen:   Awake, no distress   ?Resp:  Normal effort  ?MSK:   Moves extremities without difficulty  ?Other:  Mild CVA tenderness bilaterally  ? ?Medical Decision Making  ?Medically screening exam initiated at 11:35 PM.  Appropriate orders placed.  [name] was informed that the remainder of the evaluation will be completed by another provider, this initial triage assessment does not replace that evaluation, and the importance of remaining in the ED until their evaluation is complete. ? ? ?  ?Garald Balding, PA-C ?05/29/21 2339 ? ?

## 2021-05-29 NOTE — ED Triage Notes (Signed)
Pt having back ain for four months, cold like symptoms for 4 days, urinary frequency for four days.  ?

## 2021-05-30 ENCOUNTER — Emergency Department (HOSPITAL_COMMUNITY): Payer: PPO

## 2021-05-30 DIAGNOSIS — S39012A Strain of muscle, fascia and tendon of lower back, initial encounter: Secondary | ICD-10-CM | POA: Diagnosis not present

## 2021-05-30 DIAGNOSIS — R079 Chest pain, unspecified: Secondary | ICD-10-CM | POA: Diagnosis not present

## 2021-05-30 DIAGNOSIS — R059 Cough, unspecified: Secondary | ICD-10-CM | POA: Diagnosis not present

## 2021-05-30 DIAGNOSIS — M545 Low back pain, unspecified: Secondary | ICD-10-CM | POA: Diagnosis not present

## 2021-05-30 LAB — BASIC METABOLIC PANEL
Anion gap: 8 (ref 5–15)
BUN: 8 mg/dL (ref 6–20)
CO2: 22 mmol/L (ref 22–32)
Calcium: 9.3 mg/dL (ref 8.9–10.3)
Chloride: 108 mmol/L (ref 98–111)
Creatinine, Ser: 0.66 mg/dL (ref 0.44–1.00)
GFR, Estimated: >60 mL/min (ref >60)
Glucose, Bld: 121 mg/dL — ABNORMAL HIGH (ref 70–99)
Potassium: 3.7 mmol/L (ref 3.5–5.1)
Sodium: 138 mmol/L (ref 135–145)

## 2021-05-30 LAB — URINALYSIS, ROUTINE W REFLEX MICROSCOPIC
Bacteria, UA: NONE SEEN
Bilirubin Urine: NEGATIVE
Glucose, UA: NEGATIVE mg/dL
Hgb urine dipstick: NEGATIVE
Ketones, ur: NEGATIVE mg/dL
Leukocytes,Ua: NEGATIVE
Nitrite: NEGATIVE
Protein, ur: NEGATIVE mg/dL
Specific Gravity, Urine: 1.01 (ref 1.005–1.030)
pH: 6 (ref 5.0–8.0)

## 2021-05-30 LAB — RESP PANEL BY RT-PCR (FLU A&B, COVID) ARPGX2
Influenza A by PCR: NEGATIVE
Influenza B by PCR: NEGATIVE
SARS Coronavirus 2 by RT PCR: NEGATIVE

## 2021-05-30 LAB — CBC
HCT: 37.8 % (ref 36.0–46.0)
Hemoglobin: 12.1 g/dL (ref 12.0–15.0)
MCH: 26.5 pg (ref 26.0–34.0)
MCHC: 32 g/dL (ref 30.0–36.0)
MCV: 82.9 fL (ref 80.0–100.0)
Platelets: 205 10*3/uL (ref 150–400)
RBC: 4.56 MIL/uL (ref 3.87–5.11)
RDW: 14.6 % (ref 11.5–15.5)
WBC: 8.7 10*3/uL (ref 4.0–10.5)
nRBC: 0 % (ref 0.0–0.2)

## 2021-05-30 LAB — I-STAT BETA HCG BLOOD, ED (MC, WL, AP ONLY): I-stat hCG, quantitative: <5 m[IU]/mL (ref <5)

## 2021-05-30 MED ORDER — LIDOCAINE 5 % EX PTCH: 1.0000 | MEDICATED_PATCH | Freq: Every day | CUTANEOUS | 0 refills | Status: DC | PRN

## 2021-05-30 MED ORDER — HYDROCODONE-ACETAMINOPHEN 5-325 MG PO TABS
1.0000 | ORAL_TABLET | Freq: Once | ORAL | Status: AC
  Administered 2021-05-30: 1 via ORAL
  Filled 2021-05-30: qty 1

## 2021-05-30 MED ORDER — ACETAMINOPHEN 325 MG PO TABS: 650.0000 mg | ORAL_TABLET | Freq: Four times a day (QID) | ORAL | 0 refills | Status: DC | PRN

## 2021-05-30 MED ORDER — METHOCARBAMOL 500 MG PO TABS: 1000.0000 mg | ORAL_TABLET | Freq: Two times a day (BID) | ORAL | 0 refills | Status: AC

## 2021-05-30 MED ORDER — LIDOCAINE 5 % EX PTCH
1.0000 | MEDICATED_PATCH | CUTANEOUS | Status: DC
  Administered 2021-05-30: 1 via TRANSDERMAL
  Filled 2021-05-30: qty 1

## 2021-05-30 MED ORDER — IBUPROFEN 800 MG PO TABS
800.0000 mg | ORAL_TABLET | Freq: Once | ORAL | Status: AC
  Administered 2021-05-30: 800 mg via ORAL
  Filled 2021-05-30: qty 1

## 2021-05-30 NOTE — Discharge Instructions (Addendum)
Please follow-up with your Primary Care Physician within the next week. Please take your medications as instructed and discuss any changes to your medications with your primary care physician.  ?  ?Please return to the Emergency Department if you have any leg numbness, leg weakness, difficulty walking, fevers, worsening of pain, lightheadedness, lose consciousness, severe abdominal pain, severe headache, difficulty urinating, or difficulty having a bowel movement. ? ?It was a pleasure caring for you today in the emergency department. ? ?Please return to the emergency department for any worsening or worrisome symptoms. ? ?  ?Please return to the emergency department immediately for any new or concerning symptoms, or if you get worse. ?

## 2021-05-30 NOTE — ED Notes (Signed)
Patient states still in pain. Triage RN [facility] notified ?

## 2021-05-30 NOTE — ED Provider Notes (Signed)
?[facility] ?Provider Note ? ? ?CSN: [id number] ?Arrival date & time: 05/29/21  2334 ? ?  ? ?History ? ?Chief Complaint  ?Patient presents with  ? Back Pain  ? Urinary Frequency  ? ? ?[name] is a 31 y.o. female. ? ? Patient as above with significant medical history as below, including schizoaffective disorder, prior overdose, anxiety, asthma, personality disorder who presents to the ED with complaint of back pain x3 months.  Increased urinary frequency.  Patient reports ongoing back pain, worsened in the past week or so.  Denies inciting event or trauma.  She works at a [employer].  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  She has been ambulatory without difficulty.  Pain is worse when she is lying down flat.  Lumbar, midline.  Not associate with numbness or tingling.  Does not radiate down her legs.  No saddle paresthesias, urinary incontinence or overflow.  No bowel incontinence or overflow.  No fevers.  No recent spinal injections.  No orthopedic surgeries on her spine.  No IVDU ? ? ? ?Past Medical History: ?No date: Acid reflux ?No date: Anxiety ?No date: Asthma ?    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    Comment:  and other meds ?No date: Personality disorder [facility]) ?No date: Schizo-affective psychosis ([place]) ?No date: Schizoaffective disorder, bipolar type ([place]) ?No date: Seizures ([place]) ?    Comment:  Last seizure December 2017 ?No date: Sleep apnea ? ?Past Surgical History: ?2009 or 2010: MOUTH SURGERY  ? ? ?The history is provided by the patient. No language interpreter was used.  ?Back Pain ?Associated symptoms: no abdominal pain, no chest pain, no fever and no headaches   ?Urinary Frequency ?Pertinent negatives include no  chest pain, no abdominal pain, no headaches and no shortness of breath.  ? ?  ? ?Home Medications ?Prior to Admission medications   ?Medication Sig Start Date End Date Taking? Authorizing Provider  ?acetaminophen (TYLENOL) 325 MG tablet Take 2 tablets (650 mg total) by mouth every 6 (six) hours as needed. 05/30/21  Yes Wynona Dove A, DO  ?lidocaine (LIDODERM) 5 % Place 1 patch onto the skin daily as needed. Remove & Discard patch within 12 hours or as directed by MD 05/30/21  Yes Jeanell Sparrow, DO  ?methocarbamol (ROBAXIN) 500 MG tablet Take 2 tablets (1,000 mg total) by mouth 2 (two) times daily for 5 days. 05/30/21 06/04/21 Yes Jeanell Sparrow, DO  ?albuterol (VENTOLIN HFA) 108 (90 Base) MCG/ACT inhaler Inhale 2 puffs into the lungs every 6 (six) hours as needed for wheezing or shortness of breath.    [provider]  ?escitalopram (LEXAPRO) 10 MG tablet Take 1 tablet (10 mg total) by mouth daily. 04/27/21 05/27/21  Massengill, Ovid Curd, MD  ?gabapentin (NEURONTIN) 100 MG capsule Take 2 capsules (200 mg total) by mouth 3 (three) times daily. 04/26/21 05/26/21  Massengill, Ovid Curd, MD  ?hydrOXYzine (VISTARIL) 25 MG capsule Take 25 mg by mouth 2 (two) times daily as needed for anxiety.    [provider]  ?pantoprazole (PROTONIX) 40 MG tablet TAKE 1 TABLET BY MOUTH DAILY ?Patient taking differently: 40 mg daily. 04/26/21   Jeanie Sewer, NP  ?propranolol (INDERAL) 10 MG tablet Take 1 tablet (10 mg total) by mouth 2 (two)  times daily. 04/26/21 05/26/21  Massengill, Ovid Curd, MD  ?traZODone (DESYREL) 100 MG tablet Take 1 tablet (100 mg total) by mouth at bedtime. 04/26/21 05/26/21  Janine Limbo, MD  ?   ? ?Allergies    ?Bee venom, Coconut flavor, Geodon [ziprasidone hcl], Haloperidol and related, Lithium, Oxycodone, Quetiapine, Shellfish allergy, Phenergan [promethazine hcl], Prilosec [omeprazole], Sulfa antibiotics, Tegretol [carbamazepine], Prozac [fluoxetine], and Tape   ? ?Review of Systems   ?Review  of Systems  ?Constitutional:  Negative for chills and fever.  ?HENT:  Negative for facial swelling and trouble swallowing.   ?Eyes:  Negative for photophobia and visual disturbance.  ?Respiratory:  Negative for cough and shortness of breath.   ?Cardiovascular:  Negative for chest pain and palpitations.  ?Gastrointestinal:  Negative for abdominal pain, nausea and vomiting.  ?Endocrine: Negative for polydipsia and polyuria.  ?Genitourinary:  Positive for frequency. Negative for difficulty urinating and hematuria.  ?Musculoskeletal:  Positive for back pain. Negative for gait problem and joint swelling.  ?Skin:  Negative for pallor and rash.  ?Neurological:  Negative for syncope and headaches.  ?Psychiatric/Behavioral:  Negative for agitation and confusion.   ? ?Physical Exam ?Updated Vital Signs ?BP (!) 105/58   Pulse 79   Temp 98 ?F (36.7 ?C) (Oral)   Resp 18   Ht '5\' 5"'$  (1.651 m)   Wt 94.8 kg   LMP 05/03/2021   SpO2 98%   BMI 34.78 kg/m?  ?Physical Exam ?Vitals and nursing note reviewed.  ?Constitutional:   ?   General: She is not in acute distress. ?   Appearance: Normal appearance.  ?HENT:  ?   Head: Normocephalic and atraumatic. No raccoon eyes, Battle's sign, right periorbital erythema or left periorbital erythema.  ?   Right Ear: External ear normal.  ?   Left Ear: External ear normal.  ?   Nose: Nose normal.  ?   Mouth/Throat:  ?   Mouth: Mucous membranes are moist.  ?Eyes:  ?   General: No scleral icterus.    ?   Right eye: No discharge.     ?   Left eye: No discharge.  ?Cardiovascular:  ?   Rate and Rhythm: Normal rate and regular rhythm.  ?   Pulses: Normal pulses.  ?   Heart sounds: Normal heart sounds.  ?Pulmonary:  ?   Effort: Pulmonary effort is normal. No respiratory distress.  ?   Breath sounds: Normal breath sounds.  ?Abdominal:  ?   General: Abdomen is flat.  ?   Palpations: Abdomen is soft.  ?   Tenderness: There is no abdominal tenderness.  ?Musculoskeletal:     ?   General: Normal range of  motion.  ?     Arms: ? ?   Cervical back: Full passive range of motion without pain and normal range of motion.  ?   Right lower leg: No edema.  ?   Left lower leg: No edema.  ?   Comments: No crepitus or step-off.  Paraspinal muscle on left does appear spasmed on exam.  ?Skin: ?   General: Skin is warm and dry.  ?   Capillary Refill: Capillary refill takes less than 2 seconds.  ?Neurological:  ?   Mental Status: She is alert and oriented to person, place, and time.  ?   GCS: GCS eye subscore is 4. GCS verbal subscore is 5. GCS motor subscore is 6.  ?   Cranial Nerves: No dysarthria or facial asymmetry.  ?  Motor: No weakness or tremor.  ?   Gait: Gait is intact. Gait normal.  ?   Deep Tendon Reflexes:  ?   Reflex Scores: ?     Achilles reflexes are 2+ on the right side and 2+ on the left side. ?Psychiatric:     ?   Mood and Affect: Mood normal.     ?   Behavior: Behavior normal.  ? ? ?ED Results / Procedures / Treatments   ?Labs ?(all labs ordered are listed, but only abnormal results are displayed) ?Labs Reviewed  ?URINALYSIS, ROUTINE W REFLEX MICROSCOPIC - Abnormal; Notable for the following components:  ?    Result Value  ? Color, Urine STRAW (*)   ? All other components within normal limits  ?BASIC METABOLIC PANEL - Abnormal; Notable for the following components:  ? Glucose, Bld 121 (*)   ? All other components within normal limits  ?RESP PANEL BY RT-PCR (FLU A&B, COVID) ARPGX2  ?CBC  ?I-STAT BETA HCG BLOOD, ED ([facility], [facility], [facility] ONLY)  ? ? ?EKG ?None ? ?Radiology ?DG Chest 2 View ? ?Result Date: 05/30/2021 ?CLINICAL DATA:  Cough, mid back pain EXAM: CHEST - 2 VIEW COMPARISON:  07/31/2019 FINDINGS: Cardiac and mediastinal contours are within normal limits. No focal pulmonary opacity. No pleural effusion or pneumothorax. No acute osseous abnormality. IMPRESSION: No acute cardiopulmonary process. Electronically Signed   By: Merilyn Baba M.D.   On: 05/30/2021 00:31  ? ?DG Lumbar Spine Complete ? ?Result Date:  05/30/2021 ?CLINICAL DATA:  Low back pain for 3-4 months after trauma EXAM: LUMBAR SPINE - COMPLETE 4+ VIEW COMPARISON:  CT 02/25/2021 FINDINGS: Five lumbar type vertebral bodies. Sacroiliac joints are symmetric. Maintenanc

## 2021-05-30 NOTE — ED Notes (Addendum)
Visitor came up to staff complaining and yelling that the patient was in pain and lying on the floor. Patient asked for water but not pain medicine. When asked if she was in pain patient states her back hurts. Tonia Ghent, NT, put patient in recliner for comfort. Visitor instructed not to yell at staff by security and gpd ?

## 2021-06-03 ENCOUNTER — Other Ambulatory Visit: Payer: Self-pay

## 2021-06-03 ENCOUNTER — Encounter (HOSPITAL_COMMUNITY): Payer: Self-pay

## 2021-06-03 ENCOUNTER — Emergency Department (HOSPITAL_COMMUNITY)
Admission: EM | Admit: 2021-06-03 | Discharge: 2021-06-04 | Disposition: A | Discharge status: Home / Self Care | Payer: PPO | Attending: Emergency Medicine | Admitting: Emergency Medicine

## 2021-06-03 DIAGNOSIS — Z20822 Contact with and (suspected) exposure to covid-19: Secondary | ICD-10-CM | POA: Diagnosis not present

## 2021-06-03 DIAGNOSIS — F411 Generalized anxiety disorder: Secondary | ICD-10-CM | POA: Diagnosis not present

## 2021-06-03 DIAGNOSIS — R059 Cough, unspecified: Secondary | ICD-10-CM | POA: Diagnosis not present

## 2021-06-03 DIAGNOSIS — R051 Acute cough: Secondary | ICD-10-CM | POA: Diagnosis not present

## 2021-06-03 DIAGNOSIS — R079 Chest pain, unspecified: Secondary | ICD-10-CM | POA: Diagnosis not present

## 2021-06-03 DIAGNOSIS — F259 Schizoaffective disorder, unspecified: Secondary | ICD-10-CM | POA: Insufficient documentation

## 2021-06-03 DIAGNOSIS — T447X5A Adverse effect of beta-adrenoreceptor antagonists, initial encounter: Secondary | ICD-10-CM | POA: Insufficient documentation

## 2021-06-03 DIAGNOSIS — Y900 Blood alcohol level of less than 20 mg/100 ml: Secondary | ICD-10-CM | POA: Diagnosis not present

## 2021-06-03 DIAGNOSIS — R0789 Other chest pain: Secondary | ICD-10-CM | POA: Diagnosis not present

## 2021-06-03 DIAGNOSIS — R42 Dizziness and giddiness: Secondary | ICD-10-CM | POA: Diagnosis not present

## 2021-06-03 DIAGNOSIS — R9431 Abnormal electrocardiogram [ECG] [EKG]: Secondary | ICD-10-CM | POA: Diagnosis not present

## 2021-06-03 DIAGNOSIS — F32A Depression, unspecified: Secondary | ICD-10-CM | POA: Diagnosis not present

## 2021-06-03 DIAGNOSIS — R45851 Suicidal ideations: Secondary | ICD-10-CM | POA: Insufficient documentation

## 2021-06-03 DIAGNOSIS — J189 Pneumonia, unspecified organism: Secondary | ICD-10-CM | POA: Diagnosis not present

## 2021-06-03 DIAGNOSIS — I959 Hypotension, unspecified: Secondary | ICD-10-CM | POA: Diagnosis not present

## 2021-06-03 NOTE — ED Triage Notes (Signed)
Cough and chest pressure for one week with decreased blood pressure at home today.  ?

## 2021-06-03 NOTE — ED Provider Notes (Signed)
? ?[name]  ?Provider Note ? ?CSN: [id number] ?Arrival date & time: 06/03/21 2326 ? ?History ?Chief Complaint  ?Patient presents with  ? Cough  ? ? ?[name] is a 31 y.o. female reports a week of non productive cough with chest soreness. She has not had a fever. She called EMS because she did not have a ride to the ED and they reported her BP was low. She was given 500cc NS enroute with improvement. She was in the ED 5 days ago for an unrelated complaint and did not mention the cough then. She has been taking tessalon without improvement.  ? ? ?Home Medications ?Prior to Admission medications   ?Medication Sig Start Date End Date Taking? Authorizing Provider  ?acetaminophen (TYLENOL) 325 MG tablet Take 2 tablets (650 mg total) by mouth every 6 (six) hours as needed. 05/30/21   Jeanell Sparrow, DO  ?albuterol (VENTOLIN HFA) 108 (90 Base) MCG/ACT inhaler Inhale 2 puffs into the lungs every 6 (six) hours as needed for wheezing or shortness of breath.    [provider]  ?escitalopram (LEXAPRO) 10 MG tablet Take 1 tablet (10 mg total) by mouth daily. 04/27/21 05/27/21  Massengill, Ovid Curd, MD  ?gabapentin (NEURONTIN) 100 MG capsule Take 2 capsules (200 mg total) by mouth 3 (three) times daily. 04/26/21 05/26/21  Massengill, Ovid Curd, MD  ?hydrOXYzine (VISTARIL) 25 MG capsule Take 25 mg by mouth 2 (two) times daily as needed for anxiety.    [provider]  ?lidocaine (LIDODERM) 5 % Place 1 patch onto the skin daily as needed. Remove & Discard patch within 12 hours or as directed by MD 05/30/21   Jeanell Sparrow, DO  ?methocarbamol (ROBAXIN) 500 MG tablet Take 2 tablets (1,000 mg total) by mouth 2 (two) times daily for 5 days. 05/30/21 06/04/21  Wynona Dove A, DO  ?pantoprazole (PROTONIX) 40 MG tablet TAKE 1 TABLET BY MOUTH DAILY ?Patient taking differently: 40 mg daily. 04/26/21   Jeanie Sewer, NP  ?propranolol (INDERAL) 10 MG tablet Take 1 tablet (10 mg  total) by mouth 2 (two) times daily. 04/26/21 05/26/21  Massengill, Ovid Curd, MD  ?traZODone (DESYREL) 100 MG tablet Take 1 tablet (100 mg total) by mouth at bedtime. 04/26/21 05/26/21  Janine Limbo, MD  ? ? ? ?Allergies    ?Bee venom, Coconut flavor, Geodon [ziprasidone hcl], Haloperidol and related, Lithium, Oxycodone, Quetiapine, Shellfish allergy, Phenergan [promethazine hcl], Prilosec [omeprazole], Sulfa antibiotics, Tegretol [carbamazepine], Prozac [fluoxetine], Tape, and Tylenol [acetaminophen] ? ? ?Review of Systems   ?Review of Systems ?Please see HPI for pertinent positives and negatives ? ?Physical Exam ?BP (!) 99/59 (BP Location: Left Arm)   Pulse 76   Temp 98.8 ?F (37.1 ?C) (Oral)   Resp 17   Ht '5\' 6"'$  (1.676 m)   Wt 93.4 kg   LMP 05/07/2021   SpO2 100%   BMI 33.25 kg/m?  ? ?Physical Exam ?Vitals and nursing note reviewed.  ?Constitutional:   ?   Appearance: Normal appearance.  ?HENT:  ?   Head: Normocephalic and atraumatic.  ?   Nose: Nose normal.  ?   Mouth/Throat:  ?   Mouth: Mucous membranes are moist.  ?Eyes:  ?   Extraocular Movements: Extraocular movements intact.  ?   Conjunctiva/sclera: Conjunctivae normal.  ?Cardiovascular:  ?   Rate and Rhythm: Normal rate.  ?Pulmonary:  ?   Effort: Pulmonary effort is normal.  ?   Breath sounds: Normal breath sounds.  ?  Abdominal:  ?   General: Abdomen is flat.  ?   Palpations: Abdomen is soft.  ?   Tenderness: There is no abdominal tenderness.  ?Musculoskeletal:     ?   General: No swelling. Normal range of motion.  ?   Cervical back: Neck supple.  ?Skin: ?   General: Skin is warm and dry.  ?Neurological:  ?   General: No focal deficit present.  ?   Mental Status: She is alert.  ?Psychiatric:     ?   Mood and Affect: Mood normal.  ? ? ?ED Results / Procedures / Treatments   ?EKG ?None ? ?Procedures ?Procedures ? ?Medications Ordered in the ED ?Medications - No data to display ? ?Initial Impression and Plan ? Patient is well appearing, no concern for  sepsis. BP improved with IVF enroute. Respiratory exam is normal. Will check CXR.  ? ?ED Course  ? ?Clinical Course as of 06/04/21 0024  ?Sun Jun 04, 2021  ?0022 Patient sleeping soundly, no distress. No hypoxia or tachypnea. I personally viewed the images from radiology studies and agree with radiologist interpretation: CXR remains clear. Recommend PCP follow up. OTC cough medications as needed.  ? [CS]  ?  ?Clinical Course User Index ?[CS] Truddie Hidden, MD  ? ? ? ?MDM Rules/Calculators/A&P ?Medical Decision Making ?Problems Addressed: ?Subacute cough: self-limited or minor problem ? ?Amount and/or Complexity of Data Reviewed ?Radiology: ordered and independent interpretation performed. Decision-making details documented in ED Course. ? ?Risk ?OTC drugs. ? ? ? ?Final Clinical Impression(s) / ED Diagnoses ?Final diagnoses:  ?Acute cough  ? ? ?Rx / DC Orders ?ED Discharge Orders   ? ? None  ? ?  ? ?  ?Truddie Hidden, MD ?06/04/21 0024 ? ?

## 2021-06-04 ENCOUNTER — Emergency Department (HOSPITAL_COMMUNITY): Payer: PPO

## 2021-06-04 ENCOUNTER — Emergency Department (HOSPITAL_COMMUNITY)
Admission: EM | Admit: 2021-06-04 | Discharge: 2021-06-04 | Disposition: A | Discharge status: Home / Self Care | Payer: PPO | Source: Home / Self Care | Attending: Emergency Medicine | Admitting: Emergency Medicine

## 2021-06-04 DIAGNOSIS — R45851 Suicidal ideations: Secondary | ICD-10-CM | POA: Insufficient documentation

## 2021-06-04 DIAGNOSIS — Z20822 Contact with and (suspected) exposure to covid-19: Secondary | ICD-10-CM | POA: Insufficient documentation

## 2021-06-04 DIAGNOSIS — F411 Generalized anxiety disorder: Secondary | ICD-10-CM | POA: Insufficient documentation

## 2021-06-04 DIAGNOSIS — F259 Schizoaffective disorder, unspecified: Secondary | ICD-10-CM | POA: Insufficient documentation

## 2021-06-04 DIAGNOSIS — T447X5A Adverse effect of beta-adrenoreceptor antagonists, initial encounter: Secondary | ICD-10-CM | POA: Insufficient documentation

## 2021-06-04 DIAGNOSIS — R9431 Abnormal electrocardiogram [ECG] [EKG]: Secondary | ICD-10-CM | POA: Diagnosis not present

## 2021-06-04 DIAGNOSIS — F32A Depression, unspecified: Secondary | ICD-10-CM

## 2021-06-04 DIAGNOSIS — R059 Cough, unspecified: Secondary | ICD-10-CM | POA: Diagnosis not present

## 2021-06-04 DIAGNOSIS — R051 Acute cough: Secondary | ICD-10-CM | POA: Diagnosis not present

## 2021-06-04 DIAGNOSIS — Y900 Blood alcohol level of less than 20 mg/100 ml: Secondary | ICD-10-CM | POA: Insufficient documentation

## 2021-06-04 LAB — CBC WITH DIFFERENTIAL/PLATELET
Abs Immature Granulocytes: 0.03 10*3/uL (ref 0.00–0.07)
Basophils Absolute: 0.1 10*3/uL (ref 0.0–0.1)
Basophils Relative: 1 %
Eosinophils Absolute: 0.1 10*3/uL (ref 0.0–0.5)
Eosinophils Relative: 1 %
HCT: 38.2 % (ref 36.0–46.0)
Hemoglobin: 12.2 g/dL (ref 12.0–15.0)
Immature Granulocytes: 0 %
Lymphocytes Relative: 38 %
Lymphs Abs: 3.1 10*3/uL (ref 0.7–4.0)
MCH: 25.8 pg — ABNORMAL LOW (ref 26.0–34.0)
MCHC: 31.9 g/dL (ref 30.0–36.0)
MCV: 80.8 fL (ref 80.0–100.0)
Monocytes Absolute: 0.6 10*3/uL (ref 0.1–1.0)
Monocytes Relative: 7 %
Neutro Abs: 4.3 10*3/uL (ref 1.7–7.7)
Neutrophils Relative %: 53 %
Platelets: 344 10*3/uL (ref 150–400)
RBC: 4.73 MIL/uL (ref 3.87–5.11)
RDW: 13.9 % (ref 11.5–15.5)
WBC: 8.2 10*3/uL (ref 4.0–10.5)
nRBC: 0 % (ref 0.0–0.2)

## 2021-06-04 LAB — RAPID URINE DRUG SCREEN, HOSP PERFORMED
Amphetamines: NOT DETECTED
Barbiturates: NOT DETECTED
Benzodiazepines: NOT DETECTED
Cocaine: NOT DETECTED
Opiates: NOT DETECTED
Tetrahydrocannabinol: NOT DETECTED

## 2021-06-04 LAB — COMPREHENSIVE METABOLIC PANEL
ALT: 14 U/L (ref 0–44)
AST: 17 U/L (ref 15–41)
Albumin: 4 g/dL (ref 3.5–5.0)
Alkaline Phosphatase: 71 U/L (ref 38–126)
Anion gap: 7 (ref 5–15)
BUN: 7 mg/dL (ref 6–20)
CO2: 23 mmol/L (ref 22–32)
Calcium: 9.1 mg/dL (ref 8.9–10.3)
Chloride: 108 mmol/L (ref 98–111)
Creatinine, Ser: 0.73 mg/dL (ref 0.44–1.00)
GFR, Estimated: >60 mL/min (ref >60)
Glucose, Bld: 94 mg/dL (ref 70–99)
Potassium: 3.4 mmol/L — ABNORMAL LOW (ref 3.5–5.1)
Sodium: 138 mmol/L (ref 135–145)
Total Bilirubin: 0.4 mg/dL (ref 0.3–1.2)
Total Protein: 7.2 g/dL (ref 6.5–8.1)

## 2021-06-04 LAB — I-STAT BETA HCG BLOOD, ED (MC, WL, AP ONLY): I-stat hCG, quantitative: <5 m[IU]/mL (ref <5)

## 2021-06-04 LAB — RESP PANEL BY RT-PCR (FLU A&B, COVID) ARPGX2
Influenza A by PCR: NEGATIVE
Influenza B by PCR: NEGATIVE
SARS Coronavirus 2 by RT PCR: NEGATIVE

## 2021-06-04 LAB — SALICYLATE LEVEL: Salicylate Lvl: <7 mg/dL — ABNORMAL LOW (ref 7.0–30.0)

## 2021-06-04 LAB — ETHANOL: Alcohol, Ethyl (B): <10 mg/dL (ref <10)

## 2021-06-04 LAB — ACETAMINOPHEN LEVEL: Acetaminophen (Tylenol), Serum: <10 ug/mL — ABNORMAL LOW (ref 10–30)

## 2021-06-04 NOTE — ED Notes (Signed)
This RN spoke with Poison Control per pt's ingestion of '30mg'$  propanolol at 2100 4/29. Per Poison Control representative, advise to obtain EKG, monitor vital signs, and assess blood acetaminophen level. Acetaminophen lab obtained in triage; Nurse technician at pt bedside for EKG at this time.  ?

## 2021-06-04 NOTE — ED Notes (Addendum)
This RN introduced self to pt along with Dr. Randal Buba, ED Provider. While discussing pt's recent history and reason for today and yesterday's ED visits, pt stated that she took 3 of her prescription propanolol pills (totaling '30mg'$ ) at 2100 before her first ED encounter (on 04/29) in attempt to harm herself. Pt repeatedly denied taking any other medications after this, including after she was discharged and before coming back into the ED a few hours later (this encounter). Plan to contact Poison Control.  ?

## 2021-06-04 NOTE — ED Notes (Signed)
Valuables inventoried and placed with security.  ?

## 2021-06-04 NOTE — ED Notes (Signed)
Pt temporarily moved to room 5 for TTS. TTS in process ?

## 2021-06-04 NOTE — ED Triage Notes (Signed)
Pt reported to ED with c/o suicidal thoughts. States she "did something tonight to end her own life but isn't going to tell what she did". Pt has odd mannerisms, states "I took three of them" but will not tell RN what she took. Pt also noted to take barcode scanner from triage computer and point it as if she is shooting a gun.  ?

## 2021-06-04 NOTE — BH Assessment (Signed)
TTS spoke to Sophia RN to put pt in a private room to complete TTS assessment.  Clinician to call the cart in ten minutes. ?
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
## 2021-06-04 NOTE — ED Notes (Signed)
Refuses VS  ?

## 2021-06-04 NOTE — ED Provider Notes (Signed)
Pt has been cleared from psych to be d/c.  She is to f/u with ACT team.  IVC rescinded. ?  ?Isla [place], MD ?06/04/21 1054 ? ?

## 2021-06-04 NOTE — ED Notes (Signed)
Breakfast Orders Placed °

## 2021-06-04 NOTE — ED Provider Notes (Signed)
?[facility] ?Provider Note ? ? ?CSN: [id number] ?Arrival date & time: 06/04/21  0055 ? ?  ? ?History ? ?Chief Complaint  ?Patient presents with  ? Suicidal  ? ? ?[name] is a 31 y.o. female. ? ?The history is provided by the patient.  ?Drug Overdose ?This is a new problem. The current episode started 6 to 12 hours ago. The problem occurs constantly. The problem has not changed since onset.Pertinent negatives include no chest pain, no abdominal pain, no headaches and no shortness of breath. Nothing aggravates the symptoms. Nothing relieves the symptoms. She has tried nothing for the symptoms. The treatment provided no relief.  ?Patient reports taking meds.  Initially refused to say which medications "3 pills" at 9 pm prior to calling EMS the first time.  In order to kill herself.  States she is hearing voice and is tire of it.  Upon further questioning, she states the 3 pills she took were propranolol.   ?  ? ?Home Medications ?Prior to Admission medications   ?Medication Sig Start Date End Date Taking? Authorizing Provider  ?acetaminophen (TYLENOL) 325 MG tablet Take 2 tablets (650 mg total) by mouth every 6 (six) hours as needed. 05/30/21   Jeanell Sparrow, DO  ?albuterol (VENTOLIN HFA) 108 (90 Base) MCG/ACT inhaler Inhale 2 puffs into the lungs every 6 (six) hours as needed for wheezing or shortness of breath.    [provider]  ?escitalopram (LEXAPRO) 10 MG tablet Take 1 tablet (10 mg total) by mouth daily. 04/27/21 05/27/21  Massengill, Ovid Curd, MD  ?gabapentin (NEURONTIN) 100 MG capsule Take 2 capsules (200 mg total) by mouth 3 (three) times daily. 04/26/21 05/26/21  Massengill, Ovid Curd, MD  ?hydrOXYzine (VISTARIL) 25 MG capsule Take 25 mg by mouth 2 (two) times daily as needed for anxiety.    [provider]  ?lidocaine (LIDODERM) 5 % Place 1 patch onto the skin daily as needed. Remove & Discard patch within 12 hours or as directed by MD 05/30/21    Jeanell Sparrow, DO  ?methocarbamol (ROBAXIN) 500 MG tablet Take 2 tablets (1,000 mg total) by mouth 2 (two) times daily for 5 days. 05/30/21 06/04/21  Wynona Dove A, DO  ?pantoprazole (PROTONIX) 40 MG tablet TAKE 1 TABLET BY MOUTH DAILY ?Patient taking differently: 40 mg daily. 04/26/21   Jeanie Sewer, NP  ?propranolol (INDERAL) 10 MG tablet Take 1 tablet (10 mg total) by mouth 2 (two) times daily. 04/26/21 05/26/21  Massengill, Ovid Curd, MD  ?traZODone (DESYREL) 100 MG tablet Take 1 tablet (100 mg total) by mouth at bedtime. 04/26/21 05/26/21  Janine Limbo, MD  ?   ? ?Allergies    ?Bee venom, Coconut flavor, Geodon [ziprasidone hcl], Haloperidol and related, Lithium, Oxycodone, Quetiapine, Shellfish allergy, Phenergan [promethazine hcl], Prilosec [omeprazole], Sulfa antibiotics, Tegretol [carbamazepine], Prozac [fluoxetine], Tape, and Tylenol [acetaminophen]   ? ?Review of Systems   ?Review of Systems  ?Constitutional:  Negative for fever.  ?HENT:  Negative for congestion.   ?Eyes:  Negative for redness.  ?Respiratory:  Positive for cough. Negative for shortness of breath.   ?Cardiovascular:  Negative for chest pain.  ?Gastrointestinal:  Negative for abdominal pain.  ?Neurological:  Negative for headaches.  ?Psychiatric/Behavioral:  Positive for hallucinations and suicidal ideas.   ?All other systems reviewed and are negative. ? ?Physical Exam ?Updated Vital Signs ?BP 100/84   Pulse 86   Temp 98.4 ?F (36.9 ?C) (Oral)   Resp 16   LMP  05/07/2021   SpO2 97%  ?Physical Exam ?Vitals and nursing note reviewed. Exam conducted with a chaperone present.  ?Constitutional:   ?   General: She is not in acute distress. ?   Appearance: Normal appearance.  ?HENT:  ?   Head: Normocephalic and atraumatic.  ?   Nose: Nose normal.  ?Eyes:  ?   Conjunctiva/sclera: Conjunctivae normal.  ?   Pupils: Pupils are equal, round, and reactive to light.  ?Cardiovascular:  ?   Rate and Rhythm: Normal rate and regular rhythm.  ?    Pulses: Normal pulses.  ?   Heart sounds: Normal heart sounds.  ?Pulmonary:  ?   Effort: Pulmonary effort is normal.  ?   Breath sounds: Normal breath sounds.  ?Abdominal:  ?   General: Bowel sounds are normal.  ?   Palpations: Abdomen is soft.  ?   Tenderness: There is no abdominal tenderness. There is no guarding.  ?Musculoskeletal:  ?   Cervical back: Normal range of motion and neck supple.  ?Neurological:  ?   General: No focal deficit present.  ?   Mental Status: She is alert and oriented to person, place, and time.  ?   Deep Tendon Reflexes: Reflexes normal.  ?Psychiatric:     ?   Thought Content: Thought content includes suicidal ideation.  ? ? ?ED Results / Procedures / Treatments   ?Labs ?(all labs ordered are listed, but only abnormal results are displayed) ?Results for orders placed or performed during the hospital encounter of 06/04/21  ?Resp Panel by RT-PCR (Flu A&B, Covid) Nasopharyngeal Swab  ? Specimen: Nasopharyngeal Swab; Nasopharyngeal(NP) swabs in vial transport medium  ?Result Value Ref Range  ? SARS Coronavirus 2 by RT PCR NEGATIVE NEGATIVE  ? Influenza A by PCR NEGATIVE NEGATIVE  ? Influenza B by PCR NEGATIVE NEGATIVE  ?Comprehensive metabolic panel  ?Result Value Ref Range  ? Sodium 138 135 - 145 mmol/L  ? Potassium 3.4 (L) 3.5 - 5.1 mmol/L  ? Chloride 108 98 - 111 mmol/L  ? CO2 23 22 - 32 mmol/L  ? Glucose, Bld 94 70 - 99 mg/dL  ? BUN 7 6 - 20 mg/dL  ? Creatinine, Ser 0.73 0.44 - 1.00 mg/dL  ? Calcium 9.1 8.9 - 10.3 mg/dL  ? Total Protein 7.2 6.5 - 8.1 g/dL  ? Albumin 4.0 3.5 - 5.0 g/dL  ? AST 17 15 - 41 U/L  ? ALT 14 0 - 44 U/L  ? Alkaline Phosphatase 71 38 - 126 U/L  ? Total Bilirubin 0.4 0.3 - 1.2 mg/dL  ? GFR, Estimated >60 >60 mL/min  ? Anion gap 7 5 - 15  ?Salicylate level  ?Result Value Ref Range  ? Salicylate Lvl <2.6 (L) 7.0 - 30.0 mg/dL  ?Acetaminophen level  ?Result Value Ref Range  ? Acetaminophen (Tylenol), Serum <10 (L) 10 - 30 ug/mL  ?Ethanol  ?Result Value Ref Range  ?  Alcohol, Ethyl (B) <10 <10 mg/dL  ?Urine rapid drug screen (hosp performed)  ?Result Value Ref Range  ? Opiates NONE DETECTED NONE DETECTED  ? Cocaine NONE DETECTED NONE DETECTED  ? Benzodiazepines NONE DETECTED NONE DETECTED  ? Amphetamines NONE DETECTED NONE DETECTED  ? Tetrahydrocannabinol NONE DETECTED NONE DETECTED  ? Barbiturates NONE DETECTED NONE DETECTED  ?CBC WITH DIFFERENTIAL  ?Result Value Ref Range  ? WBC 8.2 4.0 - 10.5 K/uL  ? RBC 4.73 3.87 - 5.11 MIL/uL  ? Hemoglobin 12.2 12.0 - 15.0 g/dL  ?  HCT 38.2 36.0 - 46.0 %  ? MCV 80.8 80.0 - 100.0 fL  ? MCH 25.8 (L) 26.0 - 34.0 pg  ? MCHC 31.9 30.0 - 36.0 g/dL  ? RDW 13.9 11.5 - 15.5 %  ? Platelets 344 150 - 400 K/uL  ? nRBC 0.0 0.0 - 0.2 %  ? Neutrophils Relative % 53 %  ? Neutro Abs 4.3 1.7 - 7.7 K/uL  ? Lymphocytes Relative 38 %  ? Lymphs Abs 3.1 0.7 - 4.0 K/uL  ? Monocytes Relative 7 %  ? Monocytes Absolute 0.6 0.1 - 1.0 K/uL  ? Eosinophils Relative 1 %  ? Eosinophils Absolute 0.1 0.0 - 0.5 K/uL  ? Basophils Relative 1 %  ? Basophils Absolute 0.1 0.0 - 0.1 K/uL  ? Immature Granulocytes 0 %  ? Abs Immature Granulocytes 0.03 0.00 - 0.07 K/uL  ?I-Stat beta hCG blood, ED  ?Result Value Ref Range  ? I-stat hCG, quantitative <5.0 <5 mIU/mL  ? Comment 3          ? ?DG Chest 2 View ? ?Result Date: 06/04/2021 ?CLINICAL DATA:  Cough EXAM: CHEST - 2 VIEW COMPARISON:  05/30/2021 FINDINGS: Lungs are clear.  No pleural effusion or pneumothorax. The heart is normal in size. Visualized osseous structures are within normal limits. IMPRESSION: Normal chest radiographs. Electronically Signed   By: Julian Hy M.D.   On: 06/04/2021 00:18  ? ?DG Chest 2 View ? ?Result Date: 05/30/2021 ?CLINICAL DATA:  Cough, mid back pain EXAM: CHEST - 2 VIEW COMPARISON:  07/31/2019 FINDINGS: Cardiac and mediastinal contours are within normal limits. No focal pulmonary opacity. No pleural effusion or pneumothorax. No acute osseous abnormality. IMPRESSION: No acute cardiopulmonary process.  Electronically Signed   By: Merilyn Baba M.D.   On: 05/30/2021 00:31  ? ?DG Lumbar Spine Complete ? ?Result Date: 05/30/2021 ?CLINICAL DATA:  Low back pain for 3-4 months after trauma EXAM: LUMBAR SPINE - COMPLETE 4

## 2021-06-04 NOTE — ED Notes (Signed)
Pt's belongings inventoried and placed in [place] large locker #[id number]. ?

## 2021-06-04 NOTE — ED Notes (Signed)
Pt wanded by security. 

## 2021-06-07 DIAGNOSIS — J45909 Unspecified asthma, uncomplicated: Secondary | ICD-10-CM | POA: Diagnosis not present

## 2021-06-13 DIAGNOSIS — L539 Erythematous condition, unspecified: Secondary | ICD-10-CM | POA: Diagnosis not present

## 2021-06-13 DIAGNOSIS — Z8709 Personal history of other diseases of the respiratory system: Secondary | ICD-10-CM | POA: Diagnosis not present

## 2021-06-15 ENCOUNTER — Ambulatory Visit (HOSPITAL_COMMUNITY)
Admission: EM | Admit: 2021-06-15 | Discharge: 2021-06-16 | Disposition: A | Discharge status: Home / Self Care | Payer: Medicaid Other | Attending: Psychiatry | Admitting: Psychiatry

## 2021-06-15 DIAGNOSIS — Z9189 Other specified personal risk factors, not elsewhere classified: Secondary | ICD-10-CM

## 2021-06-15 DIAGNOSIS — F4322 Adjustment disorder with anxiety: Secondary | ICD-10-CM | POA: Insufficient documentation

## 2021-06-15 DIAGNOSIS — Z760 Encounter for issue of repeat prescription: Secondary | ICD-10-CM | POA: Insufficient documentation

## 2021-06-15 NOTE — ED Triage Notes (Signed)
Pt reports to [facility] voluntarily,accompanied by [employer]. Pt reports that she called [employer] tonight because she was feeling extremely anxious. Pt reports feeling like she wants to bang her head, but not to hurt herself just to calm herself down. Pt observed excessively talking and sweating. Pt reports high anxiety for about three days. Pt stated that she spoke with her peer support today as well as her therapist, but felt it was not helpful and she decided to come to [place].Pt sees Butch Penny as Transport planner at [facility] in [place].  Pt was recently seen at [facility] for depression on 4/30. Pt denies SI, HI, AVH and substance/alcohol use. Pt is routine. ?

## 2021-06-16 DIAGNOSIS — F603 Borderline personality disorder: Secondary | ICD-10-CM | POA: Diagnosis not present

## 2021-06-16 DIAGNOSIS — F4310 Post-traumatic stress disorder, unspecified: Secondary | ICD-10-CM | POA: Diagnosis not present

## 2021-06-16 DIAGNOSIS — F250 Schizoaffective disorder, bipolar type: Secondary | ICD-10-CM | POA: Diagnosis not present

## 2021-06-16 DIAGNOSIS — J45909 Unspecified asthma, uncomplicated: Secondary | ICD-10-CM | POA: Diagnosis not present

## 2021-06-16 DIAGNOSIS — Z6835 Body mass index (BMI) 35.0-35.9, adult: Secondary | ICD-10-CM | POA: Diagnosis not present

## 2021-06-16 DIAGNOSIS — R030 Elevated blood-pressure reading, without diagnosis of hypertension: Secondary | ICD-10-CM | POA: Diagnosis not present

## 2021-06-16 DIAGNOSIS — L03319 Cellulitis of trunk, unspecified: Secondary | ICD-10-CM | POA: Diagnosis not present

## 2021-06-16 MED ORDER — LORAZEPAM 1 MG PO TABS
1.0000 mg | ORAL_TABLET | Freq: Once | ORAL | Status: AC
  Administered 2021-06-16: 1 mg via ORAL
  Filled 2021-06-16: qty 1

## 2021-06-16 NOTE — ED Provider Notes (Signed)
Behavioral Health Urgent Care Medical Screening Exam ? ?Patient Name: [name] ?MRN: [id number] ?Date of Evaluation: 06/16/21 ?Chief Complaint:   ?Diagnosis:  ?Final diagnoses:  ?Anxious mood as adjustment reaction  ?Compliance with medication regimen  ? ? ?History of Present illness: [name] is a 31 y.o. female. Present to [facility] by [employer],  for increase anxiety,  per the pt I just need something to calm me down,  because I start a new job on Monday, so I called the police to bring me her because I dont have a ride.  ? ?Observation of patient she is alert and oriented x 4,  speech clear but word salad.  Mood anxious,  affect congruent with mood.  Pt denies SI,  HI,  AVH, denies alcohol or drug use denies Paranoia.  Per the patient "I'm just anxious and I need something to calm me down,  I start a new job on Monday and I'm excited,  I'm not suicidal or nothing".  Pt stat she also miss taking there medications today because she fell a sleep.  I discuss with patient the need to follow her medication regimen.   ? ?Will give one time stat dose of Ativan 1 mg p.o then d/c home ?Meds ordered this encounter  ?Medications  ? LORazepam (ATIVAN) tablet 1 mg  ?  ? ?Recommend discharge home ? ?Psychiatric Specialty Exam ? ?Presentation  ?General Appearance:Casual ? ?Eye Contact:Fair ? ?Speech:Clear and Coherent ? ?Speech Volume:Increased ? ?Handedness:Ambidextrous ? ? ?Mood and Affect  ?Mood:Anxious ? ?Affect:Congruent ? ? ?Thought Process  ?Thought Processes:Coherent ? ?Descriptions of Associations:Circumstantial ? ?Orientation:Full (Time, Place and Person) ? ?Thought Content:Abstract Reasoning ? Diagnosis of Schizophrenia or Schizoaffective disorder in past: Yes ? Duration of Psychotic Symptoms: Greater than six months ? Hallucinations:None ?"voices from radio saying kill yourself" ?"bugs" ? ?Ideas of Reference:None ? ?Suicidal Thoughts:No ?With Plan; With Intent ?With Plan; With Means to [place];  With Intent ? ?Homicidal Thoughts:No ? ? ?Sensorium  ?Memory:Immediate Fair ? ?Judgment:Fair ? ?Insight:Fair ? ? ?Executive Functions  ?Concentration:Fair ? ?Attention Span:Fair ? ?Recall:Fair ? ?[place] ? ?Language:Fair ? ? ?Psychomotor Activity  ?Psychomotor Activity:Normal ? ? ?Assets  ?Assets:Desire for Improvement ? ? ?Sleep  ?Sleep:Fair ? ?Number of hours: 5 ? ? ?Nutritional Assessment (For OBS and FBC admissions only) ?Has the patient had a weight loss or gain of 10 pounds or more in the last 3 months?: No ?Has the patient had a decrease in food intake/or appetite?: No ?Does the patient have dental problems?: No ?Does the patient have eating habits or behaviors that may be indicators of an eating disorder including binging or inducing vomiting?: No ?Has the patient recently lost weight without trying?: 0 ?Has the patient been eating poorly because of a decreased appetite?: 0 ?Malnutrition Screening Tool Score: 0 ? ? ? ?Physical Exam: ?Physical Exam ?HENT:  ?   Head: Normocephalic.  ?   Nose: Nose normal.  ?Cardiovascular:  ?   Rate and Rhythm: Normal rate.  ?Pulmonary:  ?   Effort: Pulmonary effort is normal.  ?Musculoskeletal:     ?   General: Normal range of motion.  ?   Cervical back: Normal range of motion.  ?Skin: ?   General: Skin is warm.  ?Neurological:  ?   General: No focal deficit present.  ?   Mental Status: She is alert.  ?Psychiatric:     ?   Mood and Affect: Mood normal.  ? ?Review of Systems  ?  Constitutional: Negative.   ?HENT: Negative.    ?Eyes: Negative.   ?Respiratory: Negative.    ?Cardiovascular: Negative.   ?Gastrointestinal: Negative.   ?Genitourinary: Negative.   ?Musculoskeletal: Negative.   ?Skin: Negative.   ?Neurological: Negative.   ?Endo/Heme/Allergies: Negative.   ?Psychiatric/Behavioral:  The patient is nervous/anxious.   ?Blood pressure 100/71, pulse 83, temperature 98.6 ?F (37 ?C), temperature source Oral, resp. rate 16, SpO2 100 %. There is no height or  weight on file to calculate BMI. ? ?Musculoskeletal: ?Strength & Muscle Tone: within normal limits ?Gait & Station: normal ?Patient leans: N/A ? ? ?[facility] MSE Discharge Disposition for Follow up and Recommendations: ?Based on my evaluation the patient does not appear to have an emergency medical condition and can be discharged with resources and follow up care in outpatient services for Medication Management ? ? ?[name], NP ?06/16/2021, 5:39 AM ? ?

## 2021-06-16 NOTE — ED Notes (Signed)
[employer] transport requested. 

## 2021-06-16 NOTE — Discharge Instructions (Signed)
F/u with psychiatry

## 2021-06-23 ENCOUNTER — Emergency Department (HOSPITAL_COMMUNITY)
Admission: EM | Admit: 2021-06-23 | Discharge: 2021-06-23 | Disposition: A | Discharge status: Home / Self Care | Payer: PPO | Attending: Emergency Medicine | Admitting: Emergency Medicine

## 2021-06-23 ENCOUNTER — Encounter (HOSPITAL_COMMUNITY): Payer: Self-pay

## 2021-06-23 ENCOUNTER — Other Ambulatory Visit: Payer: Self-pay

## 2021-06-23 ENCOUNTER — Emergency Department (HOSPITAL_COMMUNITY): Payer: PPO

## 2021-06-23 ENCOUNTER — Ambulatory Visit (INDEPENDENT_AMBULATORY_CARE_PROVIDER_SITE_OTHER)
Admission: EM | Admit: 2021-06-23 | Discharge: 2021-06-24 | Discharge status: Against Medical Advice | Payer: PPO | Source: Home / Self Care

## 2021-06-23 DIAGNOSIS — F321 Major depressive disorder, single episode, moderate: Secondary | ICD-10-CM | POA: Insufficient documentation

## 2021-06-23 DIAGNOSIS — M1712 Unilateral primary osteoarthritis, left knee: Secondary | ICD-10-CM | POA: Insufficient documentation

## 2021-06-23 DIAGNOSIS — Z3202 Encounter for pregnancy test, result negative: Secondary | ICD-10-CM | POA: Insufficient documentation

## 2021-06-23 DIAGNOSIS — M1711 Unilateral primary osteoarthritis, right knee: Secondary | ICD-10-CM | POA: Diagnosis not present

## 2021-06-23 DIAGNOSIS — M545 Low back pain, unspecified: Secondary | ICD-10-CM | POA: Diagnosis not present

## 2021-06-23 DIAGNOSIS — M1710 Unilateral primary osteoarthritis, unspecified knee: Secondary | ICD-10-CM

## 2021-06-23 DIAGNOSIS — M25562 Pain in left knee: Secondary | ICD-10-CM | POA: Diagnosis not present

## 2021-06-23 DIAGNOSIS — Z79899 Other long term (current) drug therapy: Secondary | ICD-10-CM | POA: Diagnosis not present

## 2021-06-23 DIAGNOSIS — Z20822 Contact with and (suspected) exposure to covid-19: Secondary | ICD-10-CM | POA: Insufficient documentation

## 2021-06-23 DIAGNOSIS — M25561 Pain in right knee: Secondary | ICD-10-CM | POA: Diagnosis not present

## 2021-06-23 DIAGNOSIS — M5459 Other low back pain: Secondary | ICD-10-CM | POA: Diagnosis not present

## 2021-06-23 DIAGNOSIS — M549 Dorsalgia, unspecified: Secondary | ICD-10-CM | POA: Diagnosis not present

## 2021-06-23 DIAGNOSIS — G8929 Other chronic pain: Secondary | ICD-10-CM | POA: Diagnosis not present

## 2021-06-23 DIAGNOSIS — F840 Autistic disorder: Secondary | ICD-10-CM | POA: Insufficient documentation

## 2021-06-23 LAB — PREGNANCY, URINE: Preg Test, Ur: NEGATIVE

## 2021-06-23 MED ORDER — ESCITALOPRAM OXALATE 10 MG PO TABS
10.0000 mg | ORAL_TABLET | Freq: Every day | ORAL | Status: DC
  Administered 2021-06-24: 10 mg via ORAL
  Filled 2021-06-23: qty 1

## 2021-06-23 MED ORDER — PROPRANOLOL HCL 10 MG PO TABS
10.0000 mg | ORAL_TABLET | Freq: Two times a day (BID) | ORAL | Status: DC
  Administered 2021-06-24 (×3): 10 mg via ORAL
  Filled 2021-06-23 (×3): qty 1

## 2021-06-23 MED ORDER — HYDROXYZINE HCL 25 MG PO TABS
25.0000 mg | ORAL_TABLET | Freq: Two times a day (BID) | ORAL | Status: DC | PRN
  Administered 2021-06-24 (×2): 25 mg via ORAL
  Filled 2021-06-23 (×2): qty 1

## 2021-06-23 MED ORDER — ALUM & MAG HYDROXIDE-SIMETH 200-200-20 MG/5ML PO SUSP: 30.0000 mL | ORAL | Status: DC | PRN

## 2021-06-23 MED ORDER — DICLOFENAC SODIUM 1 % EX GEL
2.0000 g | Freq: Four times a day (QID) | CUTANEOUS | Status: DC
  Administered 2021-06-24 (×3): 2 g via TOPICAL
  Filled 2021-06-23: qty 100

## 2021-06-23 MED ORDER — GABAPENTIN 100 MG PO CAPS
200.0000 mg | ORAL_CAPSULE | Freq: Three times a day (TID) | ORAL | Status: DC
  Administered 2021-06-24 (×4): 200 mg via ORAL
  Filled 2021-06-23 (×4): qty 2

## 2021-06-23 MED ORDER — MAGNESIUM HYDROXIDE 400 MG/5ML PO SUSP: 30.0000 mL | Freq: Every day | ORAL | Status: DC | PRN

## 2021-06-23 MED ORDER — PANTOPRAZOLE SODIUM 40 MG PO TBEC
40.0000 mg | DELAYED_RELEASE_TABLET | Freq: Every day | ORAL | Status: DC
  Administered 2021-06-24: 40 mg via ORAL
  Filled 2021-06-23: qty 1

## 2021-06-23 MED ORDER — DICLOFENAC SODIUM 1 % EX GEL: 2.0000 g | Freq: Four times a day (QID) | CUTANEOUS | 0 refills | Status: DC

## 2021-06-23 MED ORDER — IBUPROFEN 400 MG PO TABS
400.0000 mg | ORAL_TABLET | Freq: Four times a day (QID) | ORAL | Status: DC | PRN
  Administered 2021-06-24: 400 mg via ORAL
  Filled 2021-06-23: qty 1

## 2021-06-23 NOTE — ED Triage Notes (Signed)
Pt BIB EMS with back pain and knee pain. Pt states that she thinks it is her arthritis pain that started 2 years ago.

## 2021-06-23 NOTE — ED Provider Notes (Signed)
[facility] Urgent Care Continuous Assessment Admission H&P  Date: 06/23/21 Patient Name: [name] MRN: [id number] Chief Complaint:  Chief Complaint  Patient presents with   Suicidal      Diagnoses:  Final diagnoses:  None    HPI: [name] is a 31 year old female with psychiatric history of depression, borderline personality disorder, anxiety, and suicidal ideation.  Patient presented voluntarily to [facility] via law enforcement with complaint of suicidal ideation.  Patient was seen face-to-face and her chart was reviewed by this nurse petitioner.  On evaluation, patient is alert and oriented x4.  Patient is sitting rubbing bilateral knees.  Patient reports pain of 7/10 (10 is worst pain) to bilateral knees.  Patient denies recent injury/trauma/fall.  She reports that she was recently diagnosed with osteoarthritis to bilateral knees.  Patient reports that she is depressed and suicidal due to chronic pain.  She reports that she recently loss her job due to pain preventing her from meeting job expectations. Patient says losing job is causing her to have increased stress because she would be unable to pay her bills. She says she stopped taking all her medications yesterday due to feeling overwhelmed and depressed. She denies current plan to commit suicidal but states she is unable to contract for safety.  She denies HI, AVH, paranoia, and substance abuse.  PHQ 2-9:  [place] ED from 05/01/2021 in [facility] ED from 02/03/2020 in [facility] from 07/26/2019 in [facility]  Thoughts that you would be better off dead, or of hurting yourself in some way Nearly every day Nearly every day  [Phreesia 02/03/2020] Nearly every day  PHQ-9 Total Score '17 27 15       '$ Flowsheet Row ED from 06/23/2021 in [place] DEPT ED from 06/04/2021 in [place] ED from 06/03/2021 in [place] Error: Q3, 4, or 5 should not be populated when Q2 is No High Risk Error: Q3, 4, or 5 should not be populated when Q2 is No        Total Time spent with patient: 20 minutes  Musculoskeletal  Strength & Muscle Tone: within normal limits Gait & Station: normal Patient leans: Right  Psychiatric Specialty Exam  Presentation General Appearance: Appropriate for Environment  Eye Contact:Good  Speech:Pressured  Speech Volume:Normal  Handedness:Right   Mood and Affect  Mood:Anxious  Affect:Congruent   Thought Process  Thought Processes:Coherent  Descriptions of Associations:Intact  Orientation:Full (Time, Place and Person)  Thought Content:WDL  Diagnosis of Schizophrenia or Schizoaffective disorder in past: Yes  Duration of Psychotic Symptoms: Greater than six months  Hallucinations:Hallucinations: None  Ideas of Reference:None  Suicidal Thoughts:Suicidal Thoughts: Yes, Active SI Active Intent and/or Plan: With Plan  Homicidal Thoughts:Homicidal Thoughts: No   Sensorium  Memory:Immediate Good; Recent Good; Remote Good  Judgment:Fair  Insight:Good   Executive Functions  Concentration:Fair  Attention Span:Fair  Recall:Good  Fund of Knowledge:Good  Language:Good   Psychomotor Activity  Psychomotor Activity:Psychomotor Activity: Normal   Assets  Assets:Desire for Improvement; Armed forces logistics/support/administrative officer; Housing   Sleep  Sleep:Sleep: Fair Number of Hours of Sleep: 6   Nutritional Assessment (For OBS and FBC admissions only) Has the patient had a weight loss or gain of 10 pounds or more in the last 3 months?: No Has the patient had a decrease in food intake/or appetite?: No Does the patient have dental  problems?: No Does the patient have eating habits or behaviors that may be indicators of an eating disorder including binging or  inducing vomiting?: No Has the patient recently lost weight without trying?: 0 Has the patient been eating poorly because of a decreased appetite?: 0 Malnutrition Screening Tool Score: 0    Physical Exam Vitals and nursing note reviewed.  Constitutional:      General: She is not in acute distress.    Appearance: She is well-developed.  HENT:     Head: Normocephalic and atraumatic.  Eyes:     Conjunctiva/sclera: Conjunctivae normal.  Cardiovascular:     Rate and Rhythm: Normal rate and regular rhythm.     Heart sounds: No murmur heard. Pulmonary:     Effort: Pulmonary effort is normal. No respiratory distress.     Breath sounds: Normal breath sounds.  Abdominal:     Palpations: Abdomen is soft.     Tenderness: There is no abdominal tenderness.  Musculoskeletal:        General: Tenderness present. No swelling.     Cervical back: Neck supple.     Right lower leg: Right lower leg edema: pain to bilateral knees 7/10.     Left lower leg: Left lower leg edema: pain to bilateral knees 7/10.  Skin:    General: Skin is warm and dry.     Capillary Refill: Capillary refill takes less than 2 seconds.  Neurological:     Mental Status: She is alert.  Psychiatric:        Mood and Affect: Mood normal.   Review of Systems  Constitutional: Negative.   HENT: Negative.    Eyes: Negative.   Respiratory: Negative.    Cardiovascular: Negative.   Gastrointestinal: Negative.   Genitourinary: Negative.   Musculoskeletal: Negative.   Skin: Negative.   Neurological: Negative.   Endo/Heme/Allergies: Negative.   Psychiatric/Behavioral:  Positive for depression and suicidal ideas.    There were no vitals taken for this visit. There is no height or weight on file to calculate BMI.  Past Psychiatric History:    Is the patient at risk to self? Yes  Has the patient been a risk to self in the past 6 months? Yes .    Has the patient been a risk to self within the distant past? Yes   Is the  patient a risk to others? No   Has the patient been a risk to others in the past 6 months? No   Has the patient been a risk to others within the distant past? 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    Chronic constipation    Depression    Drug-seeking behavior    Essential tremor    Headache    Overdose of acetaminophen 07/2017   and other meds   Personality disorder ([place])    Schizo-affective psychosis ([place])    Schizoaffective disorder, bipolar type ([place])    Seizures ([place])    Last seizure December 2017   Sleep apnea     Past Surgical History:  Procedure Laterality Date   MOUTH SURGERY  2009 or 2010    Family History:  Family History  Problem Relation Age of Onset   Mental illness Father    Asthma Father    PDD  Brother    Seizures Brother     Social History:  Social History   Socioeconomic History   Marital status: Widowed    Spouse name: Not on file   Number of children: 0   Years of education: Not on file   Highest education level: Not on file  Occupational History   Occupation: disability  Tobacco Use   Smoking status: Former    Packs/day: 0.00    Types: Cigarettes   Smokeless tobacco: Never   Tobacco comments:    Smoked for 2  years age [age]  Vaping Use   Vaping Use: Never used  Substance and Sexual Activity   Alcohol use: No    Alcohol/week: 1.0 standard drink    Types: 1 Standard drinks or equivalent per week    Comment: denies at this time   Drug use: No    Comment: History of cocaine use at age 21 for 4 months   Sexual activity: Not Currently    Birth control/protection: None  Other Topics Concern   Not on file  Social History Narrative   Marital status: Widowed      Children: daughter      Lives: with boyfriend, in two story home      Employment:  Disability      Tobacco: quit smoking; smoked for  two years.      Alcohol ;none      Drugs: none   Has not traveled outside of the country.   Right handed         Social Determinants of Health   Financial Resource Strain: Not on file  Food Insecurity: Not on file  Transportation Needs: Not on file  Physical Activity: Not on file  Stress: Not on file  Social Connections: Not on file  Intimate Partner Violence: Not on file    SDOH:  SDOH Screenings   Alcohol Screen: Low Risk    Last Alcohol Screening Score (AUDIT): 0  Depression (PHQ2-9): Medium Risk   PHQ-2 Score: 17  Financial Resource Strain: Not on file  Food Insecurity: Not on file  Housing: Not on file  Physical Activity: Not on file  Social Connections: Not on file  Stress: Not on file  Tobacco Use: Medium Risk   Smoking Tobacco Use: Former   Smokeless Tobacco Use: Never   Passive Exposure: Not on file  Transportation Needs: Not on file    Last Labs:  Admission on 06/23/2021, Discharged on 06/23/2021  Component Date Value Ref Range Status   Preg Test, Ur 06/23/2021 NEGATIVE  NEGATIVE Final   Comment:        THE SENSITIVITY OF THIS METHODOLOGY IS >20 mIU/mL. Performed at [facility], [place] [address] [place], [place] [address]   Admission on 06/04/2021, Discharged on 06/04/2021  Component Date Value Ref Range Status   Sodium 06/04/2021 138  135 - 145 mmol/L Final   Potassium 06/04/2021 3.4 (L)  3.5 - 5.1 mmol/L Final   Chloride 06/04/2021 108  98 - 111 mmol/L Final   CO2 06/04/2021 23  22 - 32 mmol/L Final   Glucose, Bld 06/04/2021 94  70 - 99 mg/dL Final   Glucose reference range applies only to samples taken after fasting for at least 8 hours.   BUN 06/04/2021 7  6 - 20 mg/dL Final   Creatinine, Ser 06/04/2021 0.73  0.44 - 1.00 mg/dL Final   Calcium 06/04/2021 9.1  8.9 - 10.3 mg/dL Final   Total Protein 06/04/2021  7.2  6.5 - 8.1 g/dL Final   Albumin 06/04/2021 4.0  3.5 - 5.0 g/dL Final   AST 06/04/2021 17  15 - 41 U/L Final   ALT  06/04/2021 14  0 - 44 U/L Final   Alkaline Phosphatase 06/04/2021 71  38 - 126 U/L Final   Total Bilirubin 06/04/2021 0.4  0.3 - 1.2 mg/dL Final   GFR, Estimated 06/04/2021 >60  >60 mL/min Final   Comment: (NOTE) Calculated using the CKD-EPI Creatinine Equation (2021)    Anion gap 06/04/2021 7  5 - 15 Final   Performed at [facility], [place] [address] [place], [place] [address]   Salicylate Lvl 09/32/3557 <7.0 (L)  7.0 - 30.0 mg/dL Final   Performed at [place] [address] [place], [place] [address]   Acetaminophen (Tylenol), Serum 06/04/2021 <10 (L)  10 - 30 ug/mL Final   Comment: (NOTE) Therapeutic concentrations vary significantly. A range of 10-30 ug/mL  may be an effective concentration for many patients. However, some  are best treated at concentrations outside of this range. Acetaminophen concentrations >150 ug/mL at 4 hours after ingestion  and >50 ug/mL at 12 hours after ingestion are often associated with  toxic reactions.  Performed at [facility], [place] [address] [place], [place] [address]    Alcohol, Ethyl (B) 06/04/2021 <10  <10 mg/dL Final   Comment: (NOTE) Lowest detectable limit for serum alcohol is 10 mg/dL.  For medical purposes only. Performed at [facility], [place] [address], [place], [place] [address]    Opiates 06/04/2021 NONE DETECTED  NONE DETECTED Final   Cocaine 06/04/2021 NONE DETECTED  NONE DETECTED Final   Benzodiazepines 06/04/2021 NONE DETECTED  NONE DETECTED Final   Amphetamines 06/04/2021 NONE DETECTED  NONE DETECTED Final   Tetrahydrocannabinol 06/04/2021 NONE DETECTED  NONE DETECTED Final   Barbiturates 06/04/2021 NONE DETECTED  NONE DETECTED Final   Comment: (NOTE) DRUG SCREEN FOR MEDICAL PURPOSES ONLY.  IF CONFIRMATION IS NEEDED FOR ANY PURPOSE, NOTIFY LAB WITHIN 5 DAYS.  LOWEST DETECTABLE LIMITS FOR URINE DRUG SCREEN Drug Class                     Cutoff (ng/mL) Amphetamine and metabolites     1000 Barbiturate and metabolites    200 Benzodiazepine                 283 Tricyclics and metabolites     300 Opiates and metabolites        300 Cocaine and metabolites        300 THC                            50 Performed at [facility], [place] [address], [place], [place] [address]    WBC 06/04/2021 8.2  4.0 - 10.5 K/uL Final   RBC 06/04/2021 4.73  3.87 - 5.11 MIL/uL Final   Hemoglobin 06/04/2021 12.2  12.0 - 15.0 g/dL Final   HCT 06/04/2021 38.2  36.0 - 46.0 % Final   MCV 06/04/2021 80.8  80.0 - 100.0 fL Final   MCH 06/04/2021 25.8 (L)  26.0 - 34.0 pg Final   MCHC 06/04/2021 31.9  30.0 - 36.0 g/dL Final   RDW 06/04/2021 13.9  11.5 - 15.5 % Final   Platelets 06/04/2021 344  150 - 400 K/uL Final   nRBC 06/04/2021 0.0  0.0 -  0.2 % Final   Neutrophils Relative % 06/04/2021 53  % Final   Neutro Abs 06/04/2021 4.3  1.7 - 7.7 K/uL Final   Lymphocytes Relative 06/04/2021 38  % Final   Lymphs Abs 06/04/2021 3.1  0.7 - 4.0 K/uL Final   Monocytes Relative 06/04/2021 7  % Final   Monocytes Absolute 06/04/2021 0.6  0.1 - 1.0 K/uL Final   Eosinophils Relative 06/04/2021 1  % Final   Eosinophils Absolute 06/04/2021 0.1  0.0 - 0.5 K/uL Final   Basophils Relative 06/04/2021 1  % Final   Basophils Absolute 06/04/2021 0.1  0.0 - 0.1 K/uL Final   Immature Granulocytes 06/04/2021 0  % Final   Abs Immature Granulocytes 06/04/2021 0.03  0.00 - 0.07 K/uL Final   Performed at [facility], [place] [address], [place], [place] [address]   I-stat hCG, quantitative 06/04/2021 <5.0  <5 mIU/mL Final   Comment 3 06/04/2021          Final   Comment:   GEST. AGE      CONC.  (mIU/mL)   <=1 WEEK        5 - 50     2 WEEKS       50 - 500     3 WEEKS       100 - 10,000     4 WEEKS     1,000 - 30,000        FEMALE AND NON-PREGNANT FEMALE:     LESS THAN 5 mIU/mL    SARS Coronavirus 2 by RT PCR 06/04/2021 NEGATIVE  NEGATIVE Final   Comment: (NOTE) SARS-CoV-2 target nucleic acids are NOT DETECTED.  The  SARS-CoV-2 RNA is generally detectable in upper respiratory specimens during the acute phase of infection. The lowest concentration of SARS-CoV-2 viral copies this assay can detect is 138 copies/mL. A negative result does not preclude SARS-Cov-2 infection and should not be used as the sole basis for treatment or other patient management decisions. A negative result may occur with  improper specimen collection/handling, submission of specimen other than nasopharyngeal swab, presence of viral mutation(s) within the areas targeted by this assay, and inadequate number of viral copies(<138 copies/mL). A negative result must be combined with clinical observations, patient history, and epidemiological information. The expected result is Negative.  Fact Sheet for Patients:  EntrepreneurPulse.com.au  Fact Sheet for Healthcare Providers:  IncredibleEmployment.be  This test is no                          t yet approved or cleared by the [place] FDA and  has been authorized for detection and/or diagnosis of SARS-CoV-2 by FDA under an Emergency Use Authorization (EUA). This EUA will remain  in effect (meaning this test can be used) for the duration of the COVID-19 declaration under Section 564(b)(1) of the Act, 21 U.S.C.section 360bbb-3(b)(1), unless the authorization is terminated  or revoked sooner.       Influenza A by PCR 06/04/2021 NEGATIVE  NEGATIVE Final   Influenza B by PCR 06/04/2021 NEGATIVE  NEGATIVE Final   Comment: (NOTE) The Xpert Xpress SARS-CoV-2/FLU/RSV plus assay is intended as an aid in the diagnosis of influenza from Nasopharyngeal swab specimens and should not be used as a sole basis for treatment. Nasal washings and aspirates are unacceptable for Xpert Xpress SARS-CoV-2/FLU/RSV testing.  Fact Sheet for Patients: EntrepreneurPulse.com.au  Fact Sheet for Healthcare  Providers: IncredibleEmployment.be  This test is  not yet approved or cleared by the [place] and has been authorized for detection and/or diagnosis of SARS-CoV-2 by FDA under an Emergency Use Authorization (EUA). This EUA will remain in effect (meaning this test can be used) for the duration of the COVID-19 declaration under Section 564(b)(1) of the Act, 21 U.S.C. section 360bbb-3(b)(1), unless the authorization is terminated or revoked.  Performed at [facility], [place] [address], [place], [place] [address]   Admission on 05/29/2021, Discharged on 05/30/2021  Component Date Value Ref Range Status   SARS Coronavirus 2 by RT PCR 05/29/2021 NEGATIVE  NEGATIVE Final   Comment: (NOTE) SARS-CoV-2 target nucleic acids are NOT DETECTED.  The SARS-CoV-2 RNA is generally detectable in upper respiratory specimens during the acute phase of infection. The lowest concentration of SARS-CoV-2 viral copies this assay can detect is 138 copies/mL. A negative result does not preclude SARS-Cov-2 infection and should not be used as the sole basis for treatment or other patient management decisions. A negative result may occur with  improper specimen collection/handling, submission of specimen other than nasopharyngeal swab, presence of viral mutation(s) within the areas targeted by this assay, and inadequate number of viral copies(<138 copies/mL). A negative result must be combined with clinical observations, patient history, and epidemiological information. The expected result is Negative.  Fact Sheet for Patients:  EntrepreneurPulse.com.au  Fact Sheet for Healthcare Providers:  IncredibleEmployment.be  This test is no                          t yet approved or cleared by the [place] FDA and  has been authorized for detection and/or diagnosis of SARS-CoV-2 by FDA under an Emergency Use Authorization (EUA). This EUA will  remain  in effect (meaning this test can be used) for the duration of the COVID-19 declaration under Section 564(b)(1) of the Act, 21 U.S.C.section 360bbb-3(b)(1), unless the authorization is terminated  or revoked sooner.       Influenza A by PCR 05/29/2021 NEGATIVE  NEGATIVE Final   Influenza B by PCR 05/29/2021 NEGATIVE  NEGATIVE Final   Comment: (NOTE) The Xpert Xpress SARS-CoV-2/FLU/RSV plus assay is intended as an aid in the diagnosis of influenza from Nasopharyngeal swab specimens and should not be used as a sole basis for treatment. Nasal washings and aspirates are unacceptable for Xpert Xpress SARS-CoV-2/FLU/RSV testing.  Fact Sheet for Patients: EntrepreneurPulse.com.au  Fact Sheet for Healthcare Providers: IncredibleEmployment.be  This test is not yet approved or cleared by the [place] FDA and has been authorized for detection and/or diagnosis of SARS-CoV-2 by FDA under an Emergency Use Authorization (EUA). This EUA will remain in effect (meaning this test can be used) for the duration of the COVID-19 declaration under Section 564(b)(1) of the Act, 21 U.S.C. section 360bbb-3(b)(1), unless the authorization is terminated or revoked.  Performed at [facility], [place] [address] [name] [address], [place], [place] [address]    Color, Urine 05/29/2021 STRAW (A)  YELLOW Final   APPearance 05/29/2021 CLEAR  CLEAR Final   Specific Gravity, Urine 05/29/2021 1.010  1.005 - 1.030 Final   pH 05/29/2021 6.0  5.0 - 8.0 Final   Glucose, UA 05/29/2021 NEGATIVE  NEGATIVE mg/dL Final   Hgb urine dipstick 05/29/2021 NEGATIVE  NEGATIVE Final   Bilirubin Urine 05/29/2021 NEGATIVE  NEGATIVE Final   Ketones, ur 05/29/2021 NEGATIVE  NEGATIVE mg/dL Final   Protein, ur 05/29/2021 NEGATIVE  NEGATIVE mg/dL Final   Nitrite 05/29/2021  NEGATIVE  NEGATIVE Final   Leukocytes,Ua 05/29/2021 NEGATIVE  NEGATIVE Final   RBC / HPF 05/29/2021 0-5  0 - 5 RBC/hpf Final    WBC, UA 05/29/2021 0-5  0 - 5 WBC/hpf Final   Bacteria, UA 05/29/2021 NONE SEEN  NONE SEEN Final   Squamous Epithelial / LPF 05/29/2021 0-5  0 - 5 Final   Mucus 05/29/2021 PRESENT   Final   Performed at [facility], [place] [address], [place], [place] [address]   WBC 05/29/2021 8.7  4.0 - 10.5 K/uL Final   RBC 05/29/2021 4.56  3.87 - 5.11 MIL/uL Final   Hemoglobin 05/29/2021 12.1  12.0 - 15.0 g/dL Final   HCT 05/29/2021 37.8  36.0 - 46.0 % Final   MCV 05/29/2021 82.9  80.0 - 100.0 fL Final   MCH 05/29/2021 26.5  26.0 - 34.0 pg Final   MCHC 05/29/2021 32.0  30.0 - 36.0 g/dL Final   RDW 05/29/2021 14.6  11.5 - 15.5 % Final   Platelets 05/29/2021 205  150 - 400 K/uL Final   nRBC 05/29/2021 0.0  0.0 - 0.2 % Final   Performed at [place] [address], [place], [place] [address]   Sodium 05/29/2021 138  135 - 145 mmol/L Final   Potassium 05/29/2021 3.7  3.5 - 5.1 mmol/L Final   Chloride 05/29/2021 108  98 - 111 mmol/L Final   CO2 05/29/2021 22  22 - 32 mmol/L Final   Glucose, Bld 05/29/2021 121 (H)  70 - 99 mg/dL Final   Glucose reference range applies only to samples taken after fasting for at least 8 hours.   BUN 05/29/2021 8  6 - 20 mg/dL Final   Creatinine, Ser 05/29/2021 0.66  0.44 - 1.00 mg/dL Final   Calcium 05/29/2021 9.3  8.9 - 10.3 mg/dL Final   GFR, Estimated 05/29/2021 >60  >60 mL/min Final   Comment: (NOTE) Calculated using the CKD-EPI Creatinine Equation (2021)    Anion gap 05/29/2021 8  5 - 15 Final   Performed at [facility], [place] [address] [place], [place] [address]   I-stat hCG, quantitative 05/30/2021 <5.0  <5 mIU/mL Final   Comment 3 05/30/2021          Final   Comment:   GEST. AGE      CONC.  (mIU/mL)   <=1 WEEK        5 - 50     2 WEEKS       50 - 500     3 WEEKS       100 - 10,000     4 WEEKS     1,000 - 30,000        FEMALE AND NON-PREGNANT FEMALE:     LESS THAN 5 mIU/mL   Admission on 05/01/2021, Discharged on 05/01/2021  Component  Date Value Ref Range Status   SARS Coronavirus 2 by RT PCR 05/01/2021 NEGATIVE  NEGATIVE Final   Comment: (NOTE) SARS-CoV-2 target nucleic acids are NOT DETECTED.  The SARS-CoV-2 RNA is generally detectable in upper respiratory specimens during the acute phase of infection. The lowest concentration of SARS-CoV-2 viral copies this assay can detect is 138 copies/mL. A negative result does not preclude SARS-Cov-2 infection and should not be used as the sole basis for treatment or other patient management decisions. A negative result may occur with  improper specimen collection/handling, submission of specimen other than nasopharyngeal swab, presence of viral mutation(s) within  the areas targeted by this assay, and inadequate number of viral copies(<138 copies/mL). A negative result must be combined with clinical observations, patient history, and epidemiological information. The expected result is Negative.  Fact Sheet for Patients:  EntrepreneurPulse.com.au  Fact Sheet for Healthcare Providers:  IncredibleEmployment.be  This test is no                          t yet approved or cleared by the [place] FDA and  has been authorized for detection and/or diagnosis of SARS-CoV-2 by FDA under an Emergency Use Authorization (EUA). This EUA will remain  in effect (meaning this test can be used) for the duration of the COVID-19 declaration under Section 564(b)(1) of the Act, 21 U.S.C.section 360bbb-3(b)(1), unless the authorization is terminated  or revoked sooner.       Influenza A by PCR 05/01/2021 NEGATIVE  NEGATIVE Final   Influenza B by PCR 05/01/2021 NEGATIVE  NEGATIVE Final   Comment: (NOTE) The Xpert Xpress SARS-CoV-2/FLU/RSV plus assay is intended as an aid in the diagnosis of influenza from Nasopharyngeal swab specimens and should not be used as a sole basis for treatment. Nasal washings and aspirates are unacceptable for Xpert Xpress  SARS-CoV-2/FLU/RSV testing.  Fact Sheet for Patients: EntrepreneurPulse.com.au  Fact Sheet for Healthcare Providers: IncredibleEmployment.be  This test is not yet approved or cleared by the [place] FDA and has been authorized for detection and/or diagnosis of SARS-CoV-2 by FDA under an Emergency Use Authorization (EUA). This EUA will remain in effect (meaning this test can be used) for the duration of the COVID-19 declaration under Section 564(b)(1) of the Act, 21 U.S.C. section 360bbb-3(b)(1), unless the authorization is terminated or revoked.  Performed at [facility], [place] [address], [name], [place] [address]    WBC 05/01/2021 12.7 (H)  4.0 - 10.5 K/uL Final   RBC 05/01/2021 5.28 (H)  3.87 - 5.11 MIL/uL Final   Hemoglobin 05/01/2021 13.9  12.0 - 15.0 g/dL Final   HCT 05/01/2021 41.9  36.0 - 46.0 % Final   MCV 05/01/2021 79.4 (L)  80.0 - 100.0 fL Final   MCH 05/01/2021 26.3  26.0 - 34.0 pg Final   MCHC 05/01/2021 33.2  30.0 - 36.0 g/dL Final   RDW 05/01/2021 13.4  11.5 - 15.5 % Final   Platelets 05/01/2021 351  150 - 400 K/uL Final   nRBC 05/01/2021 0.0  0.0 - 0.2 % Final   Neutrophils Relative % 05/01/2021 66  % Final   Neutro Abs 05/01/2021 8.4 (H)  1.7 - 7.7 K/uL Final   Lymphocytes Relative 05/01/2021 27  % Final   Lymphs Abs 05/01/2021 3.4  0.7 - 4.0 K/uL Final   Monocytes Relative 05/01/2021 5  % Final   Monocytes Absolute 05/01/2021 0.7  0.1 - 1.0 K/uL Final   Eosinophils Relative 05/01/2021 1  % Final   Eosinophils Absolute 05/01/2021 0.1  0.0 - 0.5 K/uL Final   Basophils Relative 05/01/2021 0  % Final   Basophils Absolute 05/01/2021 0.1  0.0 - 0.1 K/uL Final   Immature Granulocytes 05/01/2021 1  % Final   Abs Immature Granulocytes 05/01/2021 0.07  0.00 - 0.07 K/uL Final   Performed at [facility], [place] [address], [place], [place] [address]   Sodium 05/01/2021 137  135 - 145 mmol/L Final   Potassium  05/01/2021 3.7  3.5 - 5.1 mmol/L Final   Chloride 05/01/2021 107  98 -  111 mmol/L Final   CO2 05/01/2021 21 (L)  22 - 32 mmol/L Final   Glucose, Bld 05/01/2021 96  70 - 99 mg/dL Final   Glucose reference range applies only to samples taken after fasting for at least 8 hours.   BUN 05/01/2021 9  6 - 20 mg/dL Final   Creatinine, Ser 05/01/2021 0.73  0.44 - 1.00 mg/dL Final   Calcium 05/01/2021 9.5  8.9 - 10.3 mg/dL Final   Total Protein 05/01/2021 7.8  6.5 - 8.1 g/dL Final   Albumin 05/01/2021 4.4  3.5 - 5.0 g/dL Final   AST 05/01/2021 18  15 - 41 U/L Final   ALT 05/01/2021 24  0 - 44 U/L Final   Alkaline Phosphatase 05/01/2021 70  38 - 126 U/L Final   Total Bilirubin 05/01/2021 0.6  0.3 - 1.2 mg/dL Final   GFR, Estimated 05/01/2021 >60  >60 mL/min Final   Comment: (NOTE) Calculated using the CKD-EPI Creatinine Equation (2021)    Anion gap 05/01/2021 9  5 - 15 Final   Performed at [facility] [address] [place], [place] [address]   Alcohol, Ethyl (B) 05/01/2021 <10  <10 mg/dL Final   Comment: (NOTE) Lowest detectable limit for serum alcohol is 10 mg/dL.  For medical purposes only. Performed at [facility], [place] [address], [place], [place] [address]    Cholesterol 05/01/2021 245 (H)  0 - 200 mg/dL Final   Triglycerides 05/01/2021 169 (H)  <150 mg/dL Final   HDL 05/01/2021 46  >40 mg/dL Final   Total CHOL/HDL Ratio 05/01/2021 5.3  RATIO Final   VLDL 05/01/2021 34  0 - 40 mg/dL Final   LDL Cholesterol 05/01/2021 165 (H)  0 - 99 mg/dL Final   Comment:        Total Cholesterol/HDL:CHD Risk Coronary Heart Disease Risk Table                     Men   Women  1/2 Average Risk   3.4   3.3  Average Risk       5.0   4.4  2 X Average Risk   9.6   7.1  3 X Average Risk  23.4   11.0        Use the calculated Patient Ratio above and the CHD Risk Table to determine the patient's CHD Risk.        ATP III CLASSIFICATION (LDL):  <100     mg/dL   Optimal  100-129  mg/dL    Near or Above                    Optimal  130-159  mg/dL   Borderline  160-189  mg/dL   High  >190     mg/dL   Very High Performed at [facility] [address] [place], [place] [address]    TSH 05/01/2021 2.572  0.350 - 4.500 uIU/mL Final   Comment: Performed by a 3rd Generation assay with a functional sensitivity of <=0.01 uIU/mL. Performed at [facility], [place] [address] [place], [place] [address]    SARS Coronavirus 2 Ag 05/01/2021 Negative  Negative Preliminary   SARSCOV2ONAVIRUS 2 AG 05/01/2021 NEGATIVE  NEGATIVE Final   Comment: (NOTE) SARS-CoV-2 antigen NOT DETECTED.   Negative results are presumptive.  Negative results do not preclude SARS-CoV-2 infection and should not be used as the sole basis for treatment or other  patient management decisions, including infection  control decisions, particularly in the presence of clinical signs and  symptoms consistent with COVID-19, or in those who have been in contact with the virus.  Negative results must be combined with clinical observations, patient history, and epidemiological information. The expected result is Negative.  Fact Sheet for Patients: HandmadeRecipes.com.cy  Fact Sheet for Healthcare Providers: FuneralLife.at  This test is not yet approved or cleared by the [place] FDA and  has been authorized for detection and/or diagnosis of SARS-CoV-2 by FDA under an Emergency Use Authorization (EUA).  This EUA will remain in effect (meaning this test can be used) for the duration of  the COV                          ID-19 declaration under Section 564(b)(1) of the Act, 21 U.S.C. section 360bbb-3(b)(1), unless the authorization is terminated or revoked sooner.    Admission on 04/21/2021, Discharged on 04/26/2021  Component Date Value Ref Range Status   Color, Urine 04/22/2021 YELLOW  YELLOW Final   APPearance 04/22/2021 HAZY (A)  CLEAR Final   Specific  Gravity, Urine 04/22/2021 1.016  1.005 - 1.030 Final   pH 04/22/2021 7.0  5.0 - 8.0 Final   Glucose, UA 04/22/2021 NEGATIVE  NEGATIVE mg/dL Final   Hgb urine dipstick 04/22/2021 NEGATIVE  NEGATIVE Final   Bilirubin Urine 04/22/2021 NEGATIVE  NEGATIVE Final   Ketones, ur 04/22/2021 NEGATIVE  NEGATIVE mg/dL Final   Protein, ur 04/22/2021 NEGATIVE  NEGATIVE mg/dL Final   Nitrite 04/22/2021 NEGATIVE  NEGATIVE Final   Leukocytes,Ua 04/22/2021 NEGATIVE  NEGATIVE Final   Performed at [facility] [address], [place], [place] [address]   Cholesterol 04/24/2021 190  0 - 200 mg/dL Final   Triglycerides 04/24/2021 220 (H)  <150 mg/dL Final   HDL 04/24/2021 44  >40 mg/dL Final   Total CHOL/HDL Ratio 04/24/2021 4.3  RATIO Final   VLDL 04/24/2021 44 (H)  0 - 40 mg/dL Final   LDL Cholesterol 04/24/2021 102 (H)  0 - 99 mg/dL Final   Comment:        Total Cholesterol/HDL:CHD Risk Coronary Heart Disease Risk Table                     Men   Women  1/2 Average Risk   3.4   3.3  Average Risk       5.0   4.4  2 X Average Risk   9.6   7.1  3 X Average Risk  23.4   11.0        Use the calculated Patient Ratio above and the CHD Risk Table to determine the patient's CHD Risk.        ATP III CLASSIFICATION (LDL):  <100     mg/dL   Optimal  100-129  mg/dL   Near or Above                    Optimal  130-159  mg/dL   Borderline  160-189  mg/dL   High  >190     mg/dL   Very High Performed at [facility] [address], [place],  [address]   Admission on 04/20/2021, Discharged on 04/21/2021  Component Date Value Ref Range Status   SARS Coronavirus 2 by RT PCR 04/21/2021 NEGATIVE  NEGATIVE Final   Comment: (NOTE) SARS-CoV-2 target nucleic acids are NOT  DETECTED.  The SARS-CoV-2 RNA is generally detectable in upper respiratory specimens during the acute phase of infection. The lowest concentration of SARS-CoV-2 viral copies this assay can detect is 138  copies/mL. A negative result does not preclude SARS-Cov-2 infection and should not be used as the sole basis for treatment or other patient management decisions. A negative result may occur with  improper specimen collection/handling, submission of specimen other than nasopharyngeal swab, presence of viral mutation(s) within the areas targeted by this assay, and inadequate number of viral copies(<138 copies/mL). A negative result must be combined with clinical observations, patient history, and epidemiological information. The expected result is Negative.  Fact Sheet for Patients:  EntrepreneurPulse.com.au  Fact Sheet for Healthcare Providers:  IncredibleEmployment.be  This test is no                          t yet approved or cleared by the [place] FDA and  has been authorized for detection and/or diagnosis of SARS-CoV-2 by FDA under an Emergency Use Authorization (EUA). This EUA will remain  in effect (meaning this test can be used) for the duration of the COVID-19 declaration under Section 564(b)(1) of the Act, 21 U.S.C.section 360bbb-3(b)(1), unless the authorization is terminated  or revoked sooner.       Influenza A by PCR 04/21/2021 NEGATIVE  NEGATIVE Final   Influenza B by PCR 04/21/2021 NEGATIVE  NEGATIVE Final   Comment: (NOTE) The Xpert Xpress SARS-CoV-2/FLU/RSV plus assay is intended as an aid in the diagnosis of influenza from Nasopharyngeal swab specimens and should not be used as a sole basis for treatment. Nasal washings and aspirates are unacceptable for Xpert Xpress SARS-CoV-2/FLU/RSV testing.  Fact Sheet for Patients: EntrepreneurPulse.com.au  Fact Sheet for Healthcare Providers: IncredibleEmployment.be  This test is not yet approved or cleared by the [place] FDA and has been authorized for detection and/or diagnosis of SARS-CoV-2 by FDA under an Emergency Use Authorization  (EUA). This EUA will remain in effect (meaning this test can be used) for the duration of the COVID-19 declaration under Section 564(b)(1) of the Act, 21 U.S.C. section 360bbb-3(b)(1), unless the authorization is terminated or revoked.  Performed at [facility], [place] [address], [place], [place] [address]    WBC 04/21/2021 9.0  4.0 - 10.5 K/uL Final   RBC 04/21/2021 4.63  3.87 - 5.11 MIL/uL Final   Hemoglobin 04/21/2021 12.0  12.0 - 15.0 g/dL Final   HCT 04/21/2021 37.2  36.0 - 46.0 % Final   MCV 04/21/2021 80.3  80.0 - 100.0 fL Final   MCH 04/21/2021 25.9 (L)  26.0 - 34.0 pg Final   MCHC 04/21/2021 32.3  30.0 - 36.0 g/dL Final   RDW 04/21/2021 13.4  11.5 - 15.5 % Final   Platelets 04/21/2021 275  150 - 400 K/uL Final   nRBC 04/21/2021 0.0  0.0 - 0.2 % Final   Neutrophils Relative % 04/21/2021 64  % Final   Neutro Abs 04/21/2021 5.6  1.7 - 7.7 K/uL Final   Lymphocytes Relative 04/21/2021 30  % Final   Lymphs Abs 04/21/2021 2.7  0.7 - 4.0 K/uL Final   Monocytes Relative 04/21/2021 6  % Final   Monocytes Absolute 04/21/2021 0.6  0.1 - 1.0 K/uL Final   Eosinophils Relative 04/21/2021 0  % Final   Eosinophils Absolute 04/21/2021 0.0  0.0 - 0.5 K/uL Final   Basophils Relative 04/21/2021 0  % Final   Basophils  Absolute 04/21/2021 0.0  0.0 - 0.1 K/uL Final   Immature Granulocytes 04/21/2021 0  % Final   Abs Immature Granulocytes 04/21/2021 0.03  0.00 - 0.07 K/uL Final   Performed at [facility] [address], [place], [place] [address]   Sodium 04/21/2021 137  135 - 145 mmol/L Final   Potassium 04/21/2021 3.8  3.5 - 5.1 mmol/L Final   Chloride 04/21/2021 105  98 - 111 mmol/L Final   CO2 04/21/2021 23  22 - 32 mmol/L Final   Glucose, Bld 04/21/2021 97  70 - 99 mg/dL Final   Glucose reference range applies only to samples taken after fasting for at least 8 hours.   BUN 04/21/2021 11  6 - 20 mg/dL Final   Creatinine, Ser 04/21/2021 0.72  0.44 - 1.00 mg/dL Final   Calcium  04/21/2021 9.3  8.9 - 10.3 mg/dL Final   Total Protein 04/21/2021 7.2  6.5 - 8.1 g/dL Final   Albumin 04/21/2021 4.1  3.5 - 5.0 g/dL Final   AST 04/21/2021 16  15 - 41 U/L Final   ALT 04/21/2021 15  0 - 44 U/L Final   Alkaline Phosphatase 04/21/2021 68  38 - 126 U/L Final   Total Bilirubin 04/21/2021 0.2 (L)  0.3 - 1.2 mg/dL Final   GFR, Estimated 04/21/2021 >60  >60 mL/min Final   Comment: (NOTE) Calculated using the CKD-EPI Creatinine Equation (2021)    Anion gap 04/21/2021 9  5 - 15 Final   Performed at [facility] [address], [place], [place] [address]   POC Amphetamine UR 04/21/2021 None Detected  NONE DETECTED (Cut Off Level 1000 ng/mL) Final   POC Secobarbital (BAR) 04/21/2021 None Detected  NONE DETECTED (Cut Off Level 300 ng/mL) Final   POC Buprenorphine (BUP) 04/21/2021 None Detected  NONE DETECTED (Cut Off Level 10 ng/mL) Final   POC Oxazepam (BZO) 04/21/2021 None Detected  NONE DETECTED (Cut Off Level 300 ng/mL) Final   POC Cocaine UR 04/21/2021 None Detected  NONE DETECTED (Cut Off Level 300 ng/mL) Final   POC Methamphetamine UR 04/21/2021 None Detected  NONE DETECTED (Cut Off Level 1000 ng/mL) Final   POC Morphine 04/21/2021 None Detected  NONE DETECTED (Cut Off Level 300 ng/mL) Final   POC Oxycodone UR 04/21/2021 None Detected  NONE DETECTED (Cut Off Level 100 ng/mL) Final   POC Methadone UR 04/21/2021 None Detected  NONE DETECTED (Cut Off Level 300 ng/mL) Final   POC Marijuana UR 04/21/2021 None Detected  NONE DETECTED (Cut Off Level 50 ng/mL) Final   SARS Coronavirus 2 Ag 04/21/2021 Negative  Negative Preliminary   Preg Test, Ur 04/21/2021 NEGATIVE  NEGATIVE Final   Comment:        THE SENSITIVITY OF THIS METHODOLOGY IS >24 mIU/mL    SARSCOV2ONAVIRUS 2 AG 04/21/2021 NEGATIVE  NEGATIVE Final   Comment: (NOTE) SARS-CoV-2 antigen NOT DETECTED.   Negative results are presumptive.  Negative results do not preclude SARS-CoV-2 infection and should not be used  as the sole basis for treatment or other patient management decisions, including infection  control decisions, particularly in the presence of clinical signs and  symptoms consistent with COVID-19, or in those who have been in contact with the virus.  Negative results must be combined with clinical observations, patient history, and epidemiological information. The expected result is Negative.  Fact Sheet for Patients: HandmadeRecipes.com.cy  Fact Sheet for Healthcare Providers: FuneralLife.at  This test is not yet approved or cleared  by the [place] and  has been authorized for detection and/or diagnosis of SARS-CoV-2 by FDA under an Emergency Use Authorization (EUA).  This EUA will remain in effect (meaning this test can be used) for the duration of  the COV                          ID-19 declaration under Section 564(b)(1) of the Act, 21 U.S.C. section 360bbb-3(b)(1), unless the authorization is terminated or revoked sooner.    Admission on 03/11/2021, Discharged on 03/12/2021  Component Date Value Ref Range Status   Sodium 03/11/2021 139  135 - 145 mmol/L Final   Potassium 03/11/2021 3.5  3.5 - 5.1 mmol/L Final   Chloride 03/11/2021 109  98 - 111 mmol/L Final   CO2 03/11/2021 23  22 - 32 mmol/L Final   Glucose, Bld 03/11/2021 113 (H)  70 - 99 mg/dL Final   Glucose reference range applies only to samples taken after fasting for at least 8 hours.   BUN 03/11/2021 8  6 - 20 mg/dL Final   Creatinine, Ser 03/11/2021 0.68  0.44 - 1.00 mg/dL Final   Calcium 03/11/2021 8.9  8.9 - 10.3 mg/dL Final   GFR, Estimated 03/11/2021 >60  >60 mL/min Final   Comment: (NOTE) Calculated using the CKD-EPI Creatinine Equation (2021)    Anion gap 03/11/2021 7  5 - 15 Final   Performed at [facility], [place] [address], [place], [place] [address]   WBC 03/11/2021 7.9  4.0 - 10.5 K/uL Final   RBC 03/11/2021 4.72  3.87 - 5.11 MIL/uL Final    Hemoglobin 03/11/2021 12.3  12.0 - 15.0 g/dL Final   HCT 03/11/2021 39.1  36.0 - 46.0 % Final   MCV 03/11/2021 82.8  80.0 - 100.0 fL Final   MCH 03/11/2021 26.1  26.0 - 34.0 pg Final   MCHC 03/11/2021 31.5  30.0 - 36.0 g/dL Final   RDW 03/11/2021 13.6  11.5 - 15.5 % Final   Platelets 03/11/2021 250  150 - 400 K/uL Final   nRBC 03/11/2021 0.0  0.0 - 0.2 % Final   Neutrophils Relative % 03/11/2021 61  % Final   Neutro Abs 03/11/2021 4.9  1.7 - 7.7 K/uL Final   Lymphocytes Relative 03/11/2021 31  % Final   Lymphs Abs 03/11/2021 2.4  0.7 - 4.0 K/uL Final   Monocytes Relative 03/11/2021 6  % Final   Monocytes Absolute 03/11/2021 0.5  0.1 - 1.0 K/uL Final   Eosinophils Relative 03/11/2021 1  % Final   Eosinophils Absolute 03/11/2021 0.1  0.0 - 0.5 K/uL Final   Basophils Relative 03/11/2021 1  % Final   Basophils Absolute 03/11/2021 0.0  0.0 - 0.1 K/uL Final   Immature Granulocytes 03/11/2021 0  % Final   Abs Immature Granulocytes 03/11/2021 0.03  0.00 - 0.07 K/uL Final   Performed at [facility], [place] [address], [place], [place] [address]  Admission on 03/11/2021, Discharged on 03/11/2021  Component Date Value Ref Range Status   SARS Coronavirus 2 by RT PCR 03/11/2021 NEGATIVE  NEGATIVE Final   Comment: (NOTE) SARS-CoV-2 target nucleic acids are NOT DETECTED.  The SARS-CoV-2 RNA is generally detectable in upper respiratory specimens during the acute phase of infection. The lowest concentration of SARS-CoV-2 viral copies this assay can detect is 138 copies/mL. A negative result does not preclude SARS-Cov-2 infection and should not be used as the sole  basis for treatment or other patient management decisions. A negative result may occur with  improper specimen collection/handling, submission of specimen other than nasopharyngeal swab, presence of viral mutation(s) within the areas targeted by this assay, and inadequate number of viral copies(<138 copies/mL). A negative result  must be combined with clinical observations, patient history, and epidemiological information. The expected result is Negative.  Fact Sheet for Patients:  EntrepreneurPulse.com.au  Fact Sheet for Healthcare Providers:  IncredibleEmployment.be  This test is no                          t yet approved or cleared by the [place] FDA and  has been authorized for detection and/or diagnosis of SARS-CoV-2 by FDA under an Emergency Use Authorization (EUA). This EUA will remain  in effect (meaning this test can be used) for the duration of the COVID-19 declaration under Section 564(b)(1) of the Act, 21 U.S.C.section 360bbb-3(b)(1), unless the authorization is terminated  or revoked sooner.       Influenza A by PCR 03/11/2021 NEGATIVE  NEGATIVE Final   Influenza B by PCR 03/11/2021 NEGATIVE  NEGATIVE Final   Comment: (NOTE) The Xpert Xpress SARS-CoV-2/FLU/RSV plus assay is intended as an aid in the diagnosis of influenza from Nasopharyngeal swab specimens and should not be used as a sole basis for treatment. Nasal washings and aspirates are unacceptable for Xpert Xpress SARS-CoV-2/FLU/RSV testing.  Fact Sheet for Patients: EntrepreneurPulse.com.au  Fact Sheet for Healthcare Providers: IncredibleEmployment.be  This test is not yet approved or cleared by the [place] FDA and has been authorized for detection and/or diagnosis of SARS-CoV-2 by FDA under an Emergency Use Authorization (EUA). This EUA will remain in effect (meaning this test can be used) for the duration of the COVID-19 declaration under Section 564(b)(1) of the Act, 21 U.S.C. section 360bbb-3(b)(1), unless the authorization is terminated or revoked.  Performed at [facility], [place] [address], [place], [place] [address]    WBC 03/11/2021 6.9  4.0 - 10.5 K/uL Final   RBC 03/11/2021 4.62  3.87 - 5.11 MIL/uL Final   Hemoglobin  03/11/2021 12.0  12.0 - 15.0 g/dL Final   HCT 03/11/2021 37.7  36.0 - 46.0 % Final   MCV 03/11/2021 81.6  80.0 - 100.0 fL Final   MCH 03/11/2021 26.0  26.0 - 34.0 pg Final   MCHC 03/11/2021 31.8  30.0 - 36.0 g/dL Final   RDW 03/11/2021 13.7  11.5 - 15.5 % Final   Platelets 03/11/2021 229  150 - 400 K/uL Final   nRBC 03/11/2021 0.0  0.0 - 0.2 % Final   Neutrophils Relative % 03/11/2021 57  % Final   Neutro Abs 03/11/2021 4.0  1.7 - 7.7 K/uL Final   Lymphocytes Relative 03/11/2021 34  % Final   Lymphs Abs 03/11/2021 2.3  0.7 - 4.0 K/uL Final   Monocytes Relative 03/11/2021 7  % Final   Monocytes Absolute 03/11/2021 0.5  0.1 - 1.0 K/uL Final   Eosinophils Relative 03/11/2021 1  % Final   Eosinophils Absolute 03/11/2021 0.1  0.0 - 0.5 K/uL Final   Basophils Relative 03/11/2021 1  % Final   Basophils Absolute 03/11/2021 0.0  0.0 - 0.1 K/uL Final   Immature Granulocytes 03/11/2021 0  % Final   Abs Immature Granulocytes 03/11/2021 0.03  0.00 - 0.07 K/uL Final   Performed at [facility], [place] [address], [place], [place] [address]   POC  Amphetamine UR 03/11/2021 None Detected  NONE DETECTED (Cut Off Level 1000 ng/mL) Final   POC Secobarbital (BAR) 03/11/2021 None Detected  NONE DETECTED (Cut Off Level 300 ng/mL) Final   POC Buprenorphine (BUP) 03/11/2021 None Detected  NONE DETECTED (Cut Off Level 10 ng/mL) Final   POC Oxazepam (BZO) 03/11/2021 None Detected  NONE DETECTED (Cut Off Level 300 ng/mL) Final   POC Cocaine UR 03/11/2021 None Detected  NONE DETECTED (Cut Off Level 300 ng/mL) Final   POC Methamphetamine UR 03/11/2021 None Detected  NONE DETECTED (Cut Off Level 1000 ng/mL) Final   POC Morphine 03/11/2021 None Detected  NONE DETECTED (Cut Off Level 300 ng/mL) Final   POC Oxycodone UR 03/11/2021 None Detected  NONE DETECTED (Cut Off Level 100 ng/mL) Final   POC Methadone UR 03/11/2021 None Detected  NONE DETECTED (Cut Off Level 300 ng/mL) Final   POC Marijuana UR 03/11/2021 None  Detected  NONE DETECTED (Cut Off Level 50 ng/mL) Final   SARSCOV2ONAVIRUS 2 AG 03/11/2021 NEGATIVE  NEGATIVE Final   Comment: (NOTE) SARS-CoV-2 antigen NOT DETECTED.   Negative results are presumptive.  Negative results do not preclude SARS-CoV-2 infection and should not be used as the sole basis for treatment or other patient management decisions, including infection  control decisions, particularly in the presence of clinical signs and  symptoms consistent with COVID-19, or in those who have been in contact with the virus.  Negative results must be combined with clinical observations, patient history, and epidemiological information. The expected result is Negative.  Fact Sheet for Patients: HandmadeRecipes.com.cy  Fact Sheet for Healthcare Providers: FuneralLife.at  This test is not yet approved or cleared by the [place] FDA and  has been authorized for detection and/or diagnosis of SARS-CoV-2 by FDA under an Emergency Use Authorization (EUA).  This EUA will remain in effect (meaning this test can be used) for the duration of  the COV                          ID-19 declaration under Section 564(b)(1) of the Act, 21 U.S.C. section 360bbb-3(b)(1), unless the authorization is terminated or revoked sooner.     Preg Test, Ur 03/11/2021 NEGATIVE  NEGATIVE Final   Comment:        THE SENSITIVITY OF THIS METHODOLOGY IS >24 mIU/mL   Admission on 03/02/2021, Discharged on 03/04/2021  Component Date Value Ref Range Status   SARS Coronavirus 2 by RT PCR 03/02/2021 NEGATIVE  NEGATIVE Final   Comment: (NOTE) SARS-CoV-2 target nucleic acids are NOT DETECTED.  The SARS-CoV-2 RNA is generally detectable in upper respiratory specimens during the acute phase of infection. The lowest concentration of SARS-CoV-2 viral copies this assay can detect is 138 copies/mL. A negative result does not preclude SARS-Cov-2 infection and should not be  used as the sole basis for treatment or other patient management decisions. A negative result may occur with  improper specimen collection/handling, submission of specimen other than nasopharyngeal swab, presence of viral mutation(s) within the areas targeted by this assay, and inadequate number of viral copies(<138 copies/mL). A negative result must be combined with clinical observations, patient history, and epidemiological information. The expected result is Negative.  Fact Sheet for Patients:  EntrepreneurPulse.com.au  Fact Sheet for Healthcare Providers:  IncredibleEmployment.be  This test is no                          t yet  approved or cleared by the [place] and  has been authorized for detection and/or diagnosis of SARS-CoV-2 by FDA under an Emergency Use Authorization (EUA). This EUA will remain  in effect (meaning this test can be used) for the duration of the COVID-19 declaration under Section 564(b)(1) of the Act, 21 U.S.C.section 360bbb-3(b)(1), unless the authorization is terminated  or revoked sooner.       Influenza A by PCR 03/02/2021 NEGATIVE  NEGATIVE Final   Influenza B by PCR 03/02/2021 NEGATIVE  NEGATIVE Final   Comment: (NOTE) The Xpert Xpress SARS-CoV-2/FLU/RSV plus assay is intended as an aid in the diagnosis of influenza from Nasopharyngeal swab specimens and should not be used as a sole basis for treatment. Nasal washings and aspirates are unacceptable for Xpert Xpress SARS-CoV-2/FLU/RSV testing.  Fact Sheet for Patients: EntrepreneurPulse.com.au  Fact Sheet for Healthcare Providers: IncredibleEmployment.be  This test is not yet approved or cleared by the [place] FDA and has been authorized for detection and/or diagnosis of SARS-CoV-2 by FDA under an Emergency Use Authorization (EUA). This EUA will remain in effect (meaning this test can be used) for the  duration of the COVID-19 declaration under Section 564(b)(1) of the Act, 21 U.S.C. section 360bbb-3(b)(1), unless the authorization is terminated or revoked.  Performed at [facility], [place] [address] [place], [place] [address]    WBC 03/02/2021 7.6  4.0 - 10.5 K/uL Final   RBC 03/02/2021 4.99  3.87 - 5.11 MIL/uL Final   Hemoglobin 03/02/2021 13.0  12.0 - 15.0 g/dL Final   HCT 03/02/2021 40.3  36.0 - 46.0 % Final   MCV 03/02/2021 80.8  80.0 - 100.0 fL Final   MCH 03/02/2021 26.1  26.0 - 34.0 pg Final   MCHC 03/02/2021 32.3  30.0 - 36.0 g/dL Final   RDW 03/02/2021 13.4  11.5 - 15.5 % Final   Platelets 03/02/2021 272  150 - 400 K/uL Final   nRBC 03/02/2021 0.0  0.0 - 0.2 % Final   Neutrophils Relative % 03/02/2021 65  % Final   Neutro Abs 03/02/2021 4.9  1.7 - 7.7 K/uL Final   Lymphocytes Relative 03/02/2021 30  % Final   Lymphs Abs 03/02/2021 2.2  0.7 - 4.0 K/uL Final   Monocytes Relative 03/02/2021 5  % Final   Monocytes Absolute 03/02/2021 0.4  0.1 - 1.0 K/uL Final   Eosinophils Relative 03/02/2021 0  % Final   Eosinophils Absolute 03/02/2021 0.0  0.0 - 0.5 K/uL Final   Basophils Relative 03/02/2021 0  % Final   Basophils Absolute 03/02/2021 0.0  0.0 - 0.1 K/uL Final   Immature Granulocytes 03/02/2021 0  % Final   Abs Immature Granulocytes 03/02/2021 0.03  0.00 - 0.07 K/uL Final   Performed at [facility], [place] [address], [place], [place] [address]   Sodium 03/02/2021 137  135 - 145 mmol/L Final   Potassium 03/02/2021 3.9  3.5 - 5.1 mmol/L Final   Chloride 03/02/2021 106  98 - 111 mmol/L Final   CO2 03/02/2021 20 (L)  22 - 32 mmol/L Final   Glucose, Bld 03/02/2021 89  70 - 99 mg/dL Final   Glucose reference range applies only to samples taken after fasting for at least 8 hours.   BUN 03/02/2021 5 (L)  6 - 20 mg/dL Final   Creatinine, Ser 03/02/2021 0.69  0.44 - 1.00 mg/dL Final   Calcium 03/02/2021 9.5  8.9 - 10.3 mg/dL Final   Total Protein  03/02/2021 7.3  6.5  - 8.1 g/dL Final   Albumin 03/02/2021 4.3  3.5 - 5.0 g/dL Final   AST 03/02/2021 16  15 - 41 U/L Final   ALT 03/02/2021 16  0 - 44 U/L Final   Alkaline Phosphatase 03/02/2021 76  38 - 126 U/L Final   Total Bilirubin 03/02/2021 0.5  0.3 - 1.2 mg/dL Final   GFR, Estimated 03/02/2021 >60  >60 mL/min Final   Comment: (NOTE) Calculated using the CKD-EPI Creatinine Equation (2021)    Anion gap 03/02/2021 11  5 - 15 Final   Performed at [facility], [place] [address], [place], [place] [address]   Hgb A1c MFr Bld 03/02/2021 5.1  4.8 - 5.6 % Final   Comment: (NOTE) Pre diabetes:          5.7%-6.4%  Diabetes:              >6.4%  Glycemic control for   <7.0% adults with diabetes    Mean Plasma Glucose 03/02/2021 99.67  mg/dL Final   Performed at [facility], [place] [address], [place], [place] [address]   Magnesium 03/02/2021 2.1  1.7 - 2.4 mg/dL Final   Performed at [place] [address], [place], [place] [address]   Alcohol, Ethyl (B) 03/02/2021 <10  <10 mg/dL Final   Comment: (NOTE) Lowest detectable limit for serum alcohol is 10 mg/dL.  For medical purposes only. Performed at [facility], [place] [address] [place], [place] [address]    TSH 03/02/2021 2.216  0.350 - 4.500 uIU/mL Final   Comment: Performed by a 3rd Generation assay with a functional sensitivity of <=0.01 uIU/mL. Performed at [facility], [place] [address], [place], [place] [address]    Prolactin 03/02/2021 66.2 (H)  4.8 - 23.3 ng/mL Final   Comment: (NOTE) Performed At: [facility] [place], [place] [id number] Rush Farmer MD [id number]    POC Amphetamine UR 03/02/2021 None Detected  NONE DETECTED (Cut Off Level 1000 ng/mL) Final   POC Secobarbital (BAR) 03/02/2021 None Detected  NONE DETECTED (Cut Off Level 300 ng/mL) Final   POC Buprenorphine (BUP) 03/02/2021 None Detected  NONE DETECTED (Cut Off Level 10 ng/mL) Final   POC Oxazepam (BZO) 03/02/2021 None  Detected  NONE DETECTED (Cut Off Level 300 ng/mL) Final   POC Cocaine UR 03/02/2021 None Detected  NONE DETECTED (Cut Off Level 300 ng/mL) Final   POC Methamphetamine UR 03/02/2021 None Detected  NONE DETECTED (Cut Off Level 1000 ng/mL) Final   POC Morphine 03/02/2021 None Detected  NONE DETECTED (Cut Off Level 300 ng/mL) Final   POC Oxycodone UR 03/02/2021 None Detected  NONE DETECTED (Cut Off Level 100 ng/mL) Final   POC Methadone UR 03/02/2021 None Detected  NONE DETECTED (Cut Off Level 300 ng/mL) Final   POC Marijuana UR 03/02/2021 None Detected  NONE DETECTED (Cut Off Level 50 ng/mL) Final   SARS Coronavirus 2 Ag 03/02/2021 Negative  Negative Preliminary   SARSCOV2ONAVIRUS 2 AG 03/02/2021 NEGATIVE  NEGATIVE Final   Comment: (NOTE) SARS-CoV-2 antigen NOT DETECTED.   Negative results are presumptive.  Negative results do not preclude SARS-CoV-2 infection and should not be used as the sole basis for treatment or other patient management decisions, including infection  control decisions, particularly in the presence of clinical signs and  symptoms consistent with COVID-19, or in those who have been in contact with the virus.  Negative results must  be combined with clinical observations, patient history, and epidemiological information. The expected result is Negative.  Fact Sheet for Patients: HandmadeRecipes.com.cy  Fact Sheet for Healthcare Providers: FuneralLife.at  This test is not yet approved or cleared by the [place] FDA and  has been authorized for detection and/or diagnosis of SARS-CoV-2 by FDA under an Emergency Use Authorization (EUA).  This EUA will remain in effect (meaning this test can be used) for the duration of  the COV                          ID-19 declaration under Section 564(b)(1) of the Act, 21 U.S.C. section 360bbb-3(b)(1), unless the authorization is terminated or revoked sooner.     Preg Test, Ur  03/02/2021 NEGATIVE  NEGATIVE Final   Comment:        THE SENSITIVITY OF THIS METHODOLOGY IS >24 mIU/mL    Cholesterol 03/02/2021 227 (H)  0 - 200 mg/dL Final   Triglycerides 03/02/2021 147  <150 mg/dL Final   HDL 03/02/2021 45  >40 mg/dL Final   Total CHOL/HDL Ratio 03/02/2021 5.0  RATIO Final   VLDL 03/02/2021 29  0 - 40 mg/dL Final   LDL Cholesterol 03/02/2021 153 (H)  0 - 99 mg/dL Final   Comment:        Total Cholesterol/HDL:CHD Risk Coronary Heart Disease Risk Table                     Men   Women  1/2 Average Risk   3.4   3.3  Average Risk       5.0   4.4  2 X Average Risk   9.6   7.1  3 X Average Risk  23.4   11.0        Use the calculated Patient Ratio above and the CHD Risk Table to determine the patient's CHD Risk.        ATP III CLASSIFICATION (LDL):  <100     mg/dL   Optimal  100-129  mg/dL   Near or Above                    Optimal  130-159  mg/dL   Borderline  160-189  mg/dL   High  >190     mg/dL   Very High Performed at [place] [address] [place], [place] [address]    Color, Urine 03/03/2021 STRAW (A)  YELLOW Final   APPearance 03/03/2021 CLEAR  CLEAR Final   Specific Gravity, Urine 03/03/2021 1.012  1.005 - 1.030 Final   pH 03/03/2021 6.0  5.0 - 8.0 Final   Glucose, UA 03/03/2021 NEGATIVE  NEGATIVE mg/dL Final   Hgb urine dipstick 03/03/2021 MODERATE (A)  NEGATIVE Final   Bilirubin Urine 03/03/2021 NEGATIVE  NEGATIVE Final   Ketones, ur 03/03/2021 NEGATIVE  NEGATIVE mg/dL Final   Protein, ur 03/03/2021 NEGATIVE  NEGATIVE mg/dL Final   Nitrite 03/03/2021 NEGATIVE  NEGATIVE Final   Leukocytes,Ua 03/03/2021 NEGATIVE  NEGATIVE Final   RBC / HPF 03/03/2021 0-5  0 - 5 RBC/hpf Final   WBC, UA 03/03/2021 0-5  0 - 5 WBC/hpf Final   Bacteria, UA 03/03/2021 NONE SEEN  NONE SEEN Final   Squamous Epithelial / LPF 03/03/2021 0-5  0 - 5 Final   Mucus 03/03/2021 PRESENT   Final   Performed at [facility], [place] [address] [place], [place]  [address]  Admission on 02/25/2021, Discharged on 02/25/2021  Component Date Value Ref Range Status   Sodium 02/25/2021 137  135 - 145 mmol/L Final   Potassium 02/25/2021 3.9  3.5 - 5.1 mmol/L Final   Chloride 02/25/2021 108  98 - 111 mmol/L Final   CO2 02/25/2021 22  22 - 32 mmol/L Final   Glucose, Bld 02/25/2021 114 (H)  70 - 99 mg/dL Final   Glucose reference range applies only to samples taken after fasting for at least 8 hours.   BUN 02/25/2021 8  6 - 20 mg/dL Final   Creatinine, Ser 02/25/2021 0.63  0.44 - 1.00 mg/dL Final   Calcium 02/25/2021 9.0  8.9 - 10.3 mg/dL Final   Total Protein 02/25/2021 7.5  6.5 - 8.1 g/dL Final   Albumin 02/25/2021 4.2  3.5 - 5.0 g/dL Final   AST 02/25/2021 19  15 - 41 U/L Final   ALT 02/25/2021 17  0 - 44 U/L Final   Alkaline Phosphatase 02/25/2021 68  38 - 126 U/L Final   Total Bilirubin 02/25/2021 0.4  0.3 - 1.2 mg/dL Final   GFR, Estimated 02/25/2021 >60  >60 mL/min Final   Comment: (NOTE) Calculated using the CKD-EPI Creatinine Equation (2021)    Anion gap 02/25/2021 7  5 - 15 Final   Performed at [facility], [place] [address], [place], [place] [address]   WBC 02/25/2021 9.2  4.0 - 10.5 K/uL Final   RBC 02/25/2021 4.77  3.87 - 5.11 MIL/uL Final   Hemoglobin 02/25/2021 12.4  12.0 - 15.0 g/dL Final   HCT 02/25/2021 39.1  36.0 - 46.0 % Final   MCV 02/25/2021 82.0  80.0 - 100.0 fL Final   MCH 02/25/2021 26.0  26.0 - 34.0 pg Final   MCHC 02/25/2021 31.7  30.0 - 36.0 g/dL Final   RDW 02/25/2021 13.7  11.5 - 15.5 % Final   Platelets 02/25/2021 253  150 - 400 K/uL Final   nRBC 02/25/2021 0.0  0.0 - 0.2 % Final   Neutrophils Relative % 02/25/2021 63  % Final   Neutro Abs 02/25/2021 5.8  1.7 - 7.7 K/uL Final   Lymphocytes Relative 02/25/2021 29  % Final   Lymphs Abs 02/25/2021 2.7  0.7 - 4.0 K/uL Final   Monocytes Relative 02/25/2021 7  % Final   Monocytes Absolute 02/25/2021 0.6  0.1 - 1.0 K/uL Final   Eosinophils Relative  02/25/2021 1  % Final   Eosinophils Absolute 02/25/2021 0.1  0.0 - 0.5 K/uL Final   Basophils Relative 02/25/2021 0  % Final   Basophils Absolute 02/25/2021 0.0  0.0 - 0.1 K/uL Final   Immature Granulocytes 02/25/2021 0  % Final   Abs Immature Granulocytes 02/25/2021 0.03  0.00 - 0.07 K/uL Final   Performed at [facility], [place] [address] [place] [address], [place], [place] [address]   Lipase 02/25/2021 35  11 - 51 U/L Final   Performed at [facility], [place] [address], [place], [place] [address]   Preg Test, Ur 02/25/2021 NEGATIVE  NEGATIVE Final   Comment:        THE SENSITIVITY OF THIS METHODOLOGY IS >20 mIU/mL. Performed at [facility], [place] [address] [place], [place] [address]   There may be more visits with results that are not included.    Allergies: Bee venom, Coconut flavor, Geodon [ziprasidone hcl], Haloperidol and related, Lithium, Oxycodone, Quetiapine, Shellfish allergy, Phenergan [promethazine hcl], Prilosec [omeprazole], Sulfa antibiotics, Tegretol [carbamazepine],  Prozac [fluoxetine], Tape, and Tylenol [acetaminophen]  PTA Medications: (Not in a hospital admission)   Medical Decision Making  Patient patient is unable to contract for safety.  Patient will be admitted to [facility] for continued assessment with follow-up by psychiatry on 06/24/21 Lab Orders         Resp Panel by RT-PCR (Flu A&B, Covid) Nasopharyngeal Swab         CBC with Differential/Platelet         Comprehensive metabolic panel         TSH         Pregnancy, urine         POCT Urine Drug Screen - (I-Screen)         POC SARS Coronavirus 2 Ag         Pregnancy, urine POC    -Resume home medication -Monitor patient for safety    Recommendations  Based on my evaluation the patient does not appear to have an emergency medical condition.  Ophelia Shoulder, NP 06/23/21  11:59 PM

## 2021-06-23 NOTE — Discharge Instructions (Addendum)
SEEK IMMEDIATE MEDICAL ATTENTION IF: New numbness, tingling, weakness, or problem with the use of your arms or legs.  Severe back pain not relieved with medications.  Change in bowel or bladder control.  Increasing pain in any areas of the body (such as chest or abdominal pain).  Shortness of breath, dizziness or fainting.  Nausea (feeling sick to your stomach), vomiting, fever, or sweats.  

## 2021-06-23 NOTE — ED Provider Notes (Signed)
[facility] DEPT Provider Note   CSN: [id number] Arrival date & time: 06/23/21  0451     History  Chief Complaint  Patient presents with   Knee Pain   Back Pain    [name] is a 31 y.o. female with a past medical history of borderline personality, autism spectrum, schizoaffective disorder who presents with complaint of bilateral knee and back pain.  Pain is generally worse in the morning and improves over time.  She also complains of pain which is worse when it is raining.  She feels snapping and crackling in her knees with deep bending and movement.  She has no other complaints at this time.  She denies saddle anesthesia, leg weakness, bowel or bladder incontinence.  She has not tried anything for pain   Knee Pain Associated symptoms: back pain   Back Pain     Home Medications Prior to Admission medications   Medication Sig Start Date End Date Taking? Authorizing Provider  diclofenac Sodium (VOLTAREN) 1 % GEL Apply 2 g topically 4 (four) times daily. 06/23/21  Yes Robbyn Hodkinson, PA-C  acetaminophen (TYLENOL) 325 MG tablet Take 2 tablets (650 mg total) by mouth every 6 (six) hours as needed. Patient not taking: Reported on 06/04/2021 05/30/21   Wynona Dove A, DO  albuterol (VENTOLIN HFA) 108 (90 Base) MCG/ACT inhaler Inhale 2 puffs into the lungs every 6 (six) hours as needed for wheezing or shortness of breath.    [provider]  benzonatate (TESSALON) 200 MG capsule Take 200 mg by mouth 2 (two) times daily as needed for cough.    [provider]  calcium carbonate (TUMS - DOSED IN MG ELEMENTAL CALCIUM) 500 MG chewable tablet Chew 1 tablet by mouth daily as needed for indigestion or heartburn.    [provider]  Dextromethorphan-guaiFENesin (ROBITUSSIN COLD COUGH+ CHEST PO) Take 10 mLs by mouth every 4 (four) hours as needed (cold/cough).    [provider]  escitalopram (LEXAPRO) 10 MG tablet Take 1  tablet (10 mg total) by mouth daily. 04/27/21 06/04/21  Massengill, Ovid Curd, MD  gabapentin (NEURONTIN) 100 MG capsule Take 2 capsules (200 mg total) by mouth 3 (three) times daily. 04/26/21 06/04/21  Massengill, Ovid Curd, MD  hydrOXYzine (VISTARIL) 25 MG capsule Take 25 mg by mouth 2 (two) times daily as needed for anxiety.    [provider]  ibuprofen (ADVIL) 200 MG tablet Take 400 mg by mouth every 6 (six) hours as needed for moderate pain or headache.    [provider]  lidocaine (LIDODERM) 5 % Place 1 patch onto the skin daily as needed. Remove & Discard patch within 12 hours or as directed by MD Patient taking differently: Place 1 patch onto the skin daily as needed (pain). Remove & Discard patch within 12 hours or as directed by MD 05/30/21   Wynona Dove A, DO  ondansetron (ZOFRAN) 4 MG tablet Take 4 mg by mouth every 8 (eight) hours as needed for nausea or vomiting.    [provider]  pantoprazole (PROTONIX) 40 MG tablet TAKE 1 TABLET BY MOUTH DAILY Patient taking differently: 40 mg daily. 04/26/21   Jeanie Sewer, NP  propranolol (INDERAL) 10 MG tablet Take 1 tablet (10 mg total) by mouth 2 (two) times daily. 04/26/21 06/04/21  Massengill, Ovid Curd, MD  traZODone (DESYREL) 100 MG tablet Take 1 tablet (100 mg total) by mouth at bedtime. Patient not taking: Reported on 06/04/2021 04/26/21 05/26/21  Janine Limbo, MD  Allergies    Bee venom, Coconut flavor, Geodon [ziprasidone hcl], Haloperidol and related, Lithium, Oxycodone, Quetiapine, Shellfish allergy, Phenergan [promethazine hcl], Prilosec [omeprazole], Sulfa antibiotics, Tegretol [carbamazepine], Prozac [fluoxetine], Tape, and Tylenol [acetaminophen]    Review of Systems   Review of Systems  Musculoskeletal:  Positive for back pain.   Physical Exam Updated Vital Signs BP 118/72 (BP Location: Right Arm)   Pulse 95   Temp 98.2 F (36.8 C) (Oral)   Resp 15   Ht '5\' 6"'$  (1.676 m)   Wt 92.5 kg   SpO2  100%   BMI 32.93 kg/m  Physical Exam Vitals and nursing note reviewed.  Constitutional:      General: She is not in acute distress.    Appearance: She is well-developed. She is not diaphoretic.  HENT:     Head: Normocephalic and atraumatic.     Right Ear: External ear normal.     Left Ear: External ear normal.     Nose: Nose normal.     Mouth/Throat:     Mouth: Mucous membranes are moist.  Eyes:     General: No scleral icterus.    Conjunctiva/sclera: Conjunctivae normal.  Cardiovascular:     Rate and Rhythm: Normal rate and regular rhythm.     Heart sounds: Normal heart sounds. No murmur heard.   No friction rub. No gallop.  Pulmonary:     Effort: Pulmonary effort is normal. No respiratory distress.     Breath sounds: Normal breath sounds.  Abdominal:     General: Bowel sounds are normal. There is no distension.     Palpations: Abdomen is soft. There is no mass.     Tenderness: There is no abdominal tenderness. There is no guarding.  Musculoskeletal:     Cervical back: Normal range of motion.     Comments: Full range of motion of the back, no midline spinal tenderness, crepitus with extension bilateral knees, no effusion, full strength and range of motion  Skin:    General: Skin is warm and dry.  Neurological:     Mental Status: She is alert and oriented to person, place, and time.  Psychiatric:        Behavior: Behavior normal.    ED Results / Procedures / Treatments   Labs (all labs ordered are listed, but only abnormal results are displayed) Labs Reviewed  PREGNANCY, URINE    EKG None  Radiology DG Lumbar Spine Complete  Result Date: 06/23/2021 CLINICAL DATA:  Back pain. EXAM: LUMBAR SPINE - COMPLETE 4+ VIEW COMPARISON:  Lumbar spine x-rays dated May 30, 2021. FINDINGS: Five lumbar type vertebral bodies. No acute fracture or subluxation. Vertebral body heights are preserved. Alignment is normal. Intervertebral disc spaces are maintained. The sacroiliac joints  are unremarkable. IMPRESSION: 1. Negative. Electronically Signed   By: Titus Dubin M.D.   On: 06/23/2021 09:56   DG Knee Complete 4 Views Left  Result Date: 06/23/2021 CLINICAL DATA:  Chronic bilateral knee pain. EXAM: RIGHT KNEE - COMPLETE 4+ VIEW; LEFT KNEE - COMPLETE 4+ VIEW COMPARISON:  None Available. FINDINGS: Left knee: No acute fracture or malalignment. No joint effusion. Joint spaces are preserved. Soft tissues are unremarkable. Right knee: No acute fracture or malalignment. No joint effusion. Early medial compartment joint space narrowing with tiny marginal osteophytes. Soft tissues are unremarkable. IMPRESSION: 1. Early medial compartment osteoarthritis of the right knee. 2. Normal left knee. Electronically Signed   By: Titus Dubin M.D.   On: 06/23/2021 09:58  DG Knee Complete 4 Views Right  Result Date: 06/23/2021 CLINICAL DATA:  Chronic bilateral knee pain. EXAM: RIGHT KNEE - COMPLETE 4+ VIEW; LEFT KNEE - COMPLETE 4+ VIEW COMPARISON:  None Available. FINDINGS: Left knee: No acute fracture or malalignment. No joint effusion. Joint spaces are preserved. Soft tissues are unremarkable. Right knee: No acute fracture or malalignment. No joint effusion. Early medial compartment joint space narrowing with tiny marginal osteophytes. Soft tissues are unremarkable. IMPRESSION: 1. Early medial compartment osteoarthritis of the right knee. 2. Normal left knee. Electronically Signed   By: Titus Dubin M.D.   On: 06/23/2021 09:58    Procedures Procedures    Medications Ordered in ED Medications - No data to display  ED Course/ Medical Decision Making/ A&P                           Medical Decision Making Patient with chronic back and knee pain.  Left knee shows arthritis.  Symptoms are likely related to arthritic changes.  I visualized the x-rays of the back and knees.  I interpreted these.  No acute findings.  Will discharge with anti-inflammatories, PCP follow-up  Amount and/or  Complexity of Data Reviewed Labs: ordered.    Details: Negative pregnancy test Radiology: ordered and independent interpretation performed.    Details: I ordered, visualized and interpreted bilateral knee and lumbar films.  Patient has arthritis of the left knee, no acute findings otherwise    Final Clinical Impression(s) / ED Diagnoses Final diagnoses:  Chronic bilateral low back pain without sciatica  Arthritis of knee    Rx / DC Orders ED Discharge Orders          Ordered    diclofenac Sodium (VOLTAREN) 1 % GEL  4 times daily        06/23/21 1101              Margarita Mail, PA-C 06/23/21 1505    Dorie Rank, MD 06/26/21 [phone]

## 2021-06-23 NOTE — Progress Notes (Signed)
   06/23/21 2301  [place] (Walk-ins at [facility] only)  How Did You Hear About [name]? Self  What Is the Reason for Your Visit/Call Today? Patient presents to [facility] voluntary due to SI with plan to overdose on pills. Patient reported stressors including getting fired today after working 4 days, increased arthritis pain and stress due to not being able to see her 33 year old daughter whom is in the custody of paternal grandmother. Patient reported history of suicide attempts and self harming behaviors of cutting wrist, last time 04/2021. Patient is currently being followed by [facility] for medication management and therapy. Patient is unable to contract for safety.  How Long Has This Been Causing You Problems? <Week  Have You Recently Had Any Thoughts About Hurting Yourself? Yes  How long ago did you have thoughts about hurting yourself? now  Are You Planning to [place] At This time? Yes  Have you Recently Had Thoughts About Hurting Someone [name]? No  Are You Planning To Harm Someone At This Time? No  Are you currently experiencing any auditory, visual or other hallucinations? No  Have You Used Any Alcohol or Drugs in the Past 24 Hours? No  Do you have any current medical co-morbidities that require immediate attention? No  Clinician description of patient physical appearance/behavior: neat / cooperative  What Do You Feel Would Help You the Most Today? Treatment for Depression or other mood problem  If access to [facility] was not available, would you have sought care in the Emergency Department? Yes  Determination of Need Urgent (48 hours)  Options For Referral Outpatient Therapy;Inpatient Hospitalization;Medication Management

## 2021-06-24 DIAGNOSIS — M545 Low back pain, unspecified: Secondary | ICD-10-CM | POA: Diagnosis not present

## 2021-06-24 DIAGNOSIS — F321 Major depressive disorder, single episode, moderate: Secondary | ICD-10-CM | POA: Diagnosis not present

## 2021-06-24 LAB — POCT URINE DRUG SCREEN - MANUAL ENTRY (I-SCREEN)
POC Amphetamine UR: NOT DETECTED
POC Buprenorphine (BUP): NOT DETECTED
POC Cocaine UR: NOT DETECTED
POC Marijuana UR: NOT DETECTED
POC Methadone UR: NOT DETECTED
POC Methamphetamine UR: NOT DETECTED
POC Morphine: NOT DETECTED
POC Oxazepam (BZO): POSITIVE — AB
POC Oxycodone UR: NOT DETECTED
POC Secobarbital (BAR): NOT DETECTED

## 2021-06-24 LAB — POC SARS CORONAVIRUS 2 AG: SARSCOV2ONAVIRUS 2 AG: NEGATIVE

## 2021-06-24 LAB — RESP PANEL BY RT-PCR (FLU A&B, COVID) ARPGX2
Influenza A by PCR: NEGATIVE
Influenza B by PCR: NEGATIVE
SARS Coronavirus 2 by RT PCR: NEGATIVE

## 2021-06-24 LAB — POCT PREGNANCY, URINE: Preg Test, Ur: NEGATIVE

## 2021-06-24 NOTE — ED Notes (Signed)
Pt was given a hot meal

## 2021-06-24 NOTE — Progress Notes (Signed)
Patient is alert and oriented X 3,  denies SI, HI  complains of arthritis pain this morning. Patient received all scheduled medication as well as PRN ibuprofen due to arthritic pain. Patient states she is here because " boyfriend said I was going crazy and he was going to IVC me so I came here." Patient at this time is cooperative and calm, and to speak coherently not responding to internal stimuli. Nursing staff will continue to monitor.

## 2021-06-24 NOTE — ED Notes (Addendum)
Pt sitting on bed, laughing inappropriately. Security present on unit. Pt is asking security if they want her to spit on them, kick them, and bite them. Pt attempted to wrap blanket around her neck, but this was removed by security. Pt now throwing objects and socks at security, stating she will "slap" all staff. Pt's behaviors are causing other pts to become agitated on unit. Eddie, PA aware and states he will come speak with pt on unit.

## 2021-06-24 NOTE — Progress Notes (Signed)
Patient received vistaril for anxiety. Patient states she feels that she needs to stay somewhere for a couple of weeks to get herself together. Patient states she has had SI thoughts now and  nurse offered emotional comfort. Nursing staff will continue to monitor.

## 2021-06-24 NOTE — Progress Notes (Signed)
Patient received dinner, and a snack. Patient ate and then laid back down in the the reclined chair. No acute behaviors observed.

## 2021-06-24 NOTE — ED Notes (Addendum)
Eddie, PA present on unit speaking with pt.
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
## 2021-06-24 NOTE — Discharge Instructions (Addendum)
Patient left AMA.

## 2021-06-24 NOTE — ED Provider Notes (Signed)
FBC/OBS ASAP Discharge Summary  Date and Time: 06/24/2021 11:34 PM  Name: [name]  MRN:  [id number]   Discharge Diagnoses:  Final diagnoses:  Current moderate episode of major depressive disorder without prior episode [facility])    Subjective:   [name]. [name] is a 31 year old female with a past psychiatric history significant for autism, anxiety, depression, and schizoaffective disorder (bipolar type) who presented to [facility] dating that she needed to become inpatient in order to get her thoughts together.  Patient reports that she is ready to go and that she is done being at [facility].  She reports that she is not suicidal and that if she were to have access to her phone, she would have a whole list of people that she can call to stay with.  Patient continued to deny suicidal or homicidal ideations.  When asked the reason for discharge, patient explained that she thought that she could come to [facility] and get some help.  She reports that she knows that the staff does not want her to be here and that all of her medications are currently at home.  Patient refused permission for provider to obtain collateral from family/friends.  Patient denies suicidal or homicidal ideations.  She further denies auditory or visual hallucinations and does not appear to be responding to internal/external stimuli.  Patient endorses intermittent sleep and receives on average 5 to 6 hours of sleep each night.  Patient endorses good appetite and eats on average 2-3 meals per day.  Patient denies alcohol consumption, tobacco use, and illicit drug use.  Stay Summary:   [name]. [name] is a 30 year old female with a past psychiatric history significant for autism, anxiety, depression, and schizoaffective disorder (bipolar type) who presented to [facility] dating that she needed to become inpatient in order to get her thoughts  together.  Patient is well known to this service and presented to [facility] endorsing suicidal ideations and mood instability.  Patient has a charted history past psychiatric history significant for schizoaffective disorder, borderline personality disorder, past suicidal ideations, autism, generalized anxiety disorder, and paranoia.  She reports that she has been taking her medications as indicated.  Patient has access to ACT Team services.  Patient roommate was contacted while patient was being assessed.  They denied safety concerns with the patient returning home; however, the roommate stated that the patient could benefit from inpatient services due to her mood lability.  After patient was assessed, patient was recommended overnight observation and discharge the following day.  Patient was receptive to plan.  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    Chronic constipation    Depression    Drug-seeking behavior    Essential tremor    Headache    Overdose of acetaminophen 07/2017   and other meds   Personality disorder ([place])    Schizo-affective psychosis ([place])    Schizoaffective disorder, bipolar type ([place])    Seizures ([place])    Last seizure December 2017   Sleep apnea     Past Surgical History:  Procedure Laterality Date   MOUTH SURGERY  2009 or 2010   Family History:  Family History  Problem Relation Age of Onset   Mental illness Father    Asthma Father    PDD Brother    Seizures Brother    Family Psychiatric History:  Unknown  Social History:  Social History   Substance and Sexual Activity  Alcohol Use No   Alcohol/week: 1.0 standard drink    Types: 1 Standard drinks or equivalent per week   Comment: denies at this time     Social History   Substance and Sexual Activity  Drug Use No   Comment: History of cocaine use at age 1 for 4 months    Social History   Socioeconomic History   Marital status: Widowed    Spouse name: Not on file   Number of children: 0   Years of education: Not on file   Highest education level: Not on file  Occupational History   Occupation: disability  Tobacco Use   Smoking status: Former    Packs/day: 0.00    Types: Cigarettes   Smokeless tobacco: Never   Tobacco comments:    Smoked for 2  years age [age]  Vaping Use   Vaping Use: Never used  Substance and Sexual Activity   Alcohol use: No    Alcohol/week: 1.0 standard drink    Types: 1 Standard drinks or equivalent per week    Comment: denies at this time   Drug use: No    Comment: History of cocaine use at age 61 for 4 months   Sexual activity: Not Currently    Birth control/protection: None  Other Topics Concern   Not on file  Social History Narrative   Marital status: Widowed      Children: daughter      Lives: with boyfriend, in two story home      Employment:  Disability      Tobacco: quit smoking; smoked for two years.      Alcohol ;none      Drugs: none   Has not traveled outside of the country.   Right handed         Social Determinants of Health   Financial Resource Strain: Not on file  Food Insecurity: Not on file  Transportation Needs: Not on file  Physical Activity: Not on file  Stress: Not on file  Social Connections: Not on file   SDOH:  SDOH Screenings   Alcohol Screen: Low Risk    Last Alcohol Screening Score (AUDIT): 0  Depression (PHQ2-9): Medium Risk   PHQ-2 Score: 17  Financial Resource Strain: Not on file  Food Insecurity: Not on file  Housing: Not on file  Physical Activity: Not on file  Social Connections: Not on file  Stress: Not on file  Tobacco Use: Medium Risk   Smoking Tobacco  Use: Former   Smokeless Tobacco Use: Never   Passive Exposure: Not on file  Transportation Needs: Not on file    Tobacco Cessation:  N/A, patient does not currently use tobacco products  Current Medications:  Current Facility-Administered Medications  Medication Dose Route Frequency Provider Last Rate Last Admin   alum & mag hydroxide-simeth (MAALOX/MYLANTA) 200-200-20 MG/5ML suspension 30 mL  30 mL Oral Q4H PRN Ajibola, Ene A, NP       diclofenac Sodium (VOLTAREN) 1 % topical gel 2 g  2 g Topical QID Ajibola, Ene A, NP   2 g at 06/24/21 1509   escitalopram (LEXAPRO) tablet 10 mg  10  mg Oral Daily Ajibola, Ene A, NP   10 mg at 06/24/21 0835   gabapentin (NEURONTIN) capsule 200 mg  200 mg Oral TID Ajibola, Ene A, NP   200 mg at 06/24/21 2119   hydrOXYzine (ATARAX) tablet 25 mg  25 mg Oral BID PRN Ajibola, Ene A, NP   25 mg at 06/24/21 1150   ibuprofen (ADVIL) tablet 400 mg  400 mg Oral Q6H PRN Ajibola, Ene A, NP   400 mg at 06/24/21 0836   magnesium hydroxide (MILK OF MAGNESIA) suspension 30 mL  30 mL Oral Daily PRN Ajibola, Ene A, NP       pantoprazole (PROTONIX) EC tablet 40 mg  40 mg Oral Daily Ajibola, Ene A, NP   40 mg at 06/24/21 0835   propranolol (INDERAL) tablet 10 mg  10 mg Oral BID Ajibola, Ene A, NP   10 mg at 06/24/21 2118   Current Outpatient Medications  Medication Sig Dispense Refill   budesonide-formoterol (SYMBICORT) 160-4.5 MCG/ACT inhaler Inhale 2 puffs into the lungs 2 (two) times daily.     cariprazine (VRAYLAR) 3 MG capsule Take 3 mg by mouth daily.     escitalopram (LEXAPRO) 10 MG tablet Take 1 tablet (10 mg total) by mouth daily. 30 tablet 0   gabapentin (NEURONTIN) 100 MG capsule Take 2 capsules (200 mg total) by mouth 3 (three) times daily. 180 capsule 0   melatonin 3 MG TABS tablet Take 3 mg by mouth at bedtime.     acetaminophen (TYLENOL) 325 MG tablet Take 2 tablets (650 mg total) by mouth every 6 (six) hours as needed. (Patient not taking: Reported on  06/04/2021) 36 tablet 0   albuterol (VENTOLIN HFA) 108 (90 Base) MCG/ACT inhaler Inhale 2 puffs into the lungs every 6 (six) hours as needed for wheezing or shortness of breath.     calcium carbonate (TUMS - DOSED IN MG ELEMENTAL CALCIUM) 500 MG chewable tablet Chew 1 tablet by mouth daily as needed for indigestion or heartburn.     diclofenac Sodium (VOLTAREN) 1 % GEL Apply 2 g topically 4 (four) times daily. 100 g 0   ibuprofen (ADVIL) 200 MG tablet Take 400 mg by mouth every 6 (six) hours as needed for moderate pain or headache.     lidocaine (LIDODERM) 5 % Place 1 patch onto the skin daily as needed. Remove & Discard patch within 12 hours or as directed by MD (Patient taking differently: Place 1 patch onto the skin daily as needed (pain). Remove & Discard patch within 12 hours or as directed by MD) 15 patch 0   ondansetron (ZOFRAN) 4 MG tablet Take 4 mg by mouth every 8 (eight) hours as needed for nausea or vomiting.     pantoprazole (PROTONIX) 40 MG tablet TAKE 1 TABLET BY MOUTH DAILY (Patient taking differently: 40 mg daily.) 30 tablet 0   propranolol (INDERAL) 10 MG tablet Take 1 tablet (10 mg total) by mouth 2 (two) times daily. 60 tablet 0    PTA Medications: (Not in a hospital admission)   Musculoskeletal  Strength & Muscle Tone: within normal limits Gait & Station: normal Patient leans: N/A  Psychiatric Specialty Exam  Presentation  General Appearance: Appropriate for Environment  Eye Contact:Fair  Speech:Pressured  Speech Volume:Normal  Handedness:Right   Mood and Affect  Mood:Anxious  Affect:Congruent   Thought Process  Thought Processes:Coherent  Descriptions of Associations:Intact  Orientation:Full (Time, Place and Person)  Thought Content:WDL  Diagnosis of Schizophrenia or Schizoaffective disorder  in past: Yes  Duration of Psychotic Symptoms: Greater than six months   Hallucinations:Hallucinations: None  Ideas of Reference:None  Suicidal  Thoughts:Suicidal Thoughts: No SI Active Intent and/or Plan: With Plan  Homicidal Thoughts:Homicidal Thoughts: No   Sensorium  Memory:Immediate Good; Recent Good; Remote Good  Judgment:Fair  Insight:Fair   Executive Functions  Concentration:Fair  Attention Span:Fair  [place] of Knowledge:Good  Language:Good   Psychomotor Activity  Psychomotor Activity:Psychomotor Activity: Normal   Assets  Assets:Desire for Improvement; Armed forces logistics/support/administrative officer; Housing   Sleep  Sleep:Sleep: Fair Number of Hours of Sleep: 6   Nutritional Assessment (For OBS and FBC admissions only) Has the patient had a weight loss or gain of 10 pounds or more in the last 3 months?: No Has the patient had a decrease in food intake/or appetite?: No Does the patient have dental problems?: No Does the patient have eating habits or behaviors that may be indicators of an eating disorder including binging or inducing vomiting?: No Has the patient recently lost weight without trying?: 0 Has the patient been eating poorly because of a decreased appetite?: 0 Malnutrition Screening Tool Score: 0    Physical Exam  Physical Exam Psychiatric:        Attention and Perception: Attention and perception normal. She does not perceive auditory or visual hallucinations.        Mood and Affect: Affect normal. Mood is anxious.        Speech: Speech is rapid and pressured.        Behavior: Behavior is agitated and hyperactive. Behavior is cooperative.        Thought Content: Thought content normal. Thought content does not include homicidal or suicidal ideation. Thought content does not include suicidal plan.        Cognition and Memory: Cognition and memory normal.        Judgment: Judgment is impulsive.   Review of Systems  Psychiatric/Behavioral:  Negative for depression, hallucinations, memory loss, substance abuse and suicidal ideas. The patient is nervous/anxious. The patient does not have insomnia.    Blood pressure 113/70, pulse 88, temperature 99 F (37.2 C), temperature source Oral, resp. rate 19, SpO2 100 %. There is no height or weight on file to calculate BMI.  Demographic Factors:  Adolescent or young adult, Caucasian, Low socioeconomic status, and Unemployed  Loss Factors: Decrease in vocational status  Historical Factors: Impulsivity and past history of suicidal ideations  Risk Reduction Factors:   Living with another person, especially a relative and Positive social support  Continued Clinical Symptoms:  Severe Anxiety and/or Agitation Depression:   Aggression Impulsivity Insomnia Schizophrenia:   Paranoid or undifferentiated type Personality Disorders:   Cluster B More than one psychiatric diagnosis Previous Psychiatric Diagnoses and Treatments  Cognitive Features That Contribute To Risk:  Closed-mindedness    Suicide Risk:  Minimal: No identifiable suicidal ideation.  Patients presenting with no risk factors but with morbid ruminations; may be classified as minimal risk based on the severity of the depressive symptoms  Plan Of Care/Follow-up recommendations:  Activity:  Patient to keep all follow-up outpatient appointments Diet:  Patient was encouraged to exercise daily and supplement with a healthy balance diet composed of whole foods consisting of fruits and vegetables, lean meat, and whole 100% whole-wheat  Disposition:  Patient left facility AMA.  Patient was given the proper documents at this time in order to leave AMA.  Patient denied suicidal or homicidal ideations and further denied auditory or visual hallucinations and did  not appear to be responding to internal/external stimuli.  Malachy Mood, PA 06/24/2021, 11:34 PM

## 2021-06-24 NOTE — ED Notes (Signed)
Pt clothes where washed today

## 2021-06-24 NOTE — ED Notes (Signed)
Pt sleeping in adult OBS. Respirations even and unlabored with no signs of acute distress. Will continue to monitor for safety.

## 2021-06-24 NOTE — Progress Notes (Signed)
Patient received scheduled medication, as well a snack. No acute behaviors observed at this moment. Nursing staff will continue to monitor.

## 2021-06-24 NOTE — ED Notes (Signed)
Pt assessed in Adult OBS. A&Ox4 with complaints of knee stiffness due to arthritis. Pt verbalized SI with plan to overdose on pills. Denies HA and AVH at present. Pt calm and cooperative with no signs of acute distress at present. Pt given food/drink. Will continue to monitor for safety.

## 2021-06-24 NOTE — ED Notes (Addendum)
PA completed AMA paperwork with pt. Pt A&O x4, ambulatory. Belongings returned to pt intact from locker #11. Pt escorted to front lobby by staff and security. Safety maintained.

## 2021-06-24 NOTE — Progress Notes (Signed)
Patient resting with even and unlabored respirations at this time. No objective signs of distress at this time. Nursing staff will continue to monitor.

## 2021-06-24 NOTE — Progress Notes (Signed)
Patient was speaking on the telephone and very irritable, began to slam phone and bang on the wall. Once off the telephone patient states, " They want to send me home on Tuesday, I need to go somewhere I can stay inpatient for a few weeks, I told her to send me to [place] because I am behind on everything, I don't have any money for my bills, and I will kill myself, if I go home." RN was able to redirect patient by giving emotional support, participate in deep breathing. Patient then apologized and said "it's not you." Patient then went to lay down in reclined chair, nursing staff will continue to monitor.
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
## 2021-06-24 NOTE — ED Notes (Addendum)
Pt pacing the unit, stating she is voluntary and wants to leave. Eddie, [place] aware.

## 2021-06-24 NOTE — ED Provider Notes (Signed)
Behavioral Health Progress Note  Date and Time: 06/24/2021 12:18 PM Name: [name] MRN:  [id number]  Subjective: [name] reported " I just need to come inpatient to get my thoughts together"  [name] 31 year old female is well-known to this service endorsing suicidal ideations and mood instability.  Patient has a charted history with schizoaffective disorder, borderline personality disorder, suicidal ideations, autism, generalized anxiety disorder and paranoia.  She reports she has been taking her medications as indicated.  [name] is followed by ACT services. "  I just need some time to get my thoughts together."  She provided verbal authorization to follow-up with her roommate [name].  Who denied any safety concerns with her returning home however states that she could benefit from inpatient admission due to her mood lability.  NP spoke to [name] regarding overnight observation and anticipate discharge on 06/25/2021.  She was receptive to plan.  Support, encouragement and reassurance was provided.  Diagnosis:  Final diagnoses:  Current moderate episode of major depressive disorder without prior episode ([place])    Total Time spent with patient: 15 minutes  Past Psychiatric History:  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 fragile X syndrome    Chronic constipation    Depression    Drug-seeking behavior    Essential tremor    Headache    Overdose of acetaminophen 07/2017   and other meds   Personality disorder ([place])    Schizo-affective psychosis ([place])    Schizoaffective disorder, bipolar type ([place])    Seizures ([place])    Last seizure December 2017   Sleep apnea     Past Surgical History:  Procedure Laterality Date   MOUTH SURGERY  2009 or 2010   Family History:  Family History  Problem Relation Age of Onset   Mental illness Father    Asthma Father    PDD  Brother    Seizures Brother    Family Psychiatric  History:  Social History:  Social History   Substance and Sexual Activity  Alcohol Use No   Alcohol/week: 1.0 standard drink   Types: 1 Standard drinks or equivalent per week   Comment: denies at this time     Social History   Substance and Sexual Activity  Drug Use No   Comment: History of cocaine use at age 66 for 4 months    Social History   Socioeconomic History   Marital status: Widowed    Spouse name: Not on file   Number of children: 0   Years of education: Not on file   Highest education level: Not on file  Occupational History   Occupation: disability  Tobacco Use   Smoking status: Former    Packs/day: 0.00    Types: Cigarettes   Smokeless tobacco: Never   Tobacco comments:    Smoked for 2  years age [age]  Vaping Use   Vaping Use: Never used  Substance and Sexual Activity   Alcohol use: No    Alcohol/week: 1.0 standard drink    Types: 1 Standard drinks or equivalent per week    Comment: denies at this time   Drug use: No    Comment: History of cocaine use at age 72 for 4 months   Sexual activity: Not Currently    Birth control/protection: None  Other Topics Concern  Not on file  Social History Narrative   Marital status: Widowed      Children: daughter      Lives: with boyfriend, in two story home      Employment:  Disability      Tobacco: quit smoking; smoked for two years.      Alcohol ;none      Drugs: none   Has not traveled outside of the country.   Right handed         Social Determinants of Health   Financial Resource Strain: Not on file  Food Insecurity: Not on file  Transportation Needs: Not on file  Physical Activity: Not on file  Stress: Not on file  Social Connections: Not on file   SDOH:  SDOH Screenings   Alcohol Screen: Low Risk    Last Alcohol Screening Score (AUDIT): 0  Depression (PHQ2-9): Medium Risk   PHQ-2 Score: 17  Financial Resource Strain: Not on file   Food Insecurity: Not on file  Housing: Not on file  Physical Activity: Not on file  Social Connections: Not on file  Stress: Not on file  Tobacco Use: Medium Risk   Smoking Tobacco Use: Former   Smokeless Tobacco Use: Never   Passive Exposure: Not on file  Transportation Needs: Not on file   Additional Social History:    Pain Medications: See MAR Prescriptions: See MAR Over the Counter: See MAR History of alcohol / drug use?: No history of alcohol / drug abuse Longest period of sobriety (when/how long): N/A Negative Consequences of Use:  (N/A) Withdrawal Symptoms:  (N/A)                    Sleep: Good  Appetite:  Good  Current Medications:  Current Facility-Administered Medications  Medication Dose Route Frequency Provider Last Rate Last Admin   alum & mag hydroxide-simeth (MAALOX/MYLANTA) 200-200-20 MG/5ML suspension 30 mL  30 mL Oral Q4H PRN Ajibola, Ene A, NP       diclofenac Sodium (VOLTAREN) 1 % topical gel 2 g  2 g Topical QID Ajibola, Ene A, NP   2 g at 06/24/21 0837   escitalopram (LEXAPRO) tablet 10 mg  10 mg Oral Daily Ajibola, Ene A, NP   10 mg at 06/24/21 0835   gabapentin (NEURONTIN) capsule 200 mg  200 mg Oral TID Ajibola, Ene A, NP   200 mg at 06/24/21 0835   hydrOXYzine (ATARAX) tablet 25 mg  25 mg Oral BID PRN Ajibola, Ene A, NP   25 mg at 06/24/21 1150   ibuprofen (ADVIL) tablet 400 mg  400 mg Oral Q6H PRN Ajibola, Ene A, NP   400 mg at 06/24/21 0836   magnesium hydroxide (MILK OF MAGNESIA) suspension 30 mL  30 mL Oral Daily PRN Ajibola, Ene A, NP       pantoprazole (PROTONIX) EC tablet 40 mg  40 mg Oral Daily Ajibola, Ene A, NP   40 mg at 06/24/21 0835   propranolol (INDERAL) tablet 10 mg  10 mg Oral BID Ajibola, Ene A, NP   10 mg at 06/24/21 7124   Current Outpatient Medications  Medication Sig Dispense Refill   budesonide-formoterol (SYMBICORT) 160-4.5 MCG/ACT inhaler Inhale 2 puffs into the lungs 2 (two) times daily.     cariprazine (VRAYLAR)  3 MG capsule Take 3 mg by mouth daily.     escitalopram (LEXAPRO) 10 MG tablet Take 1 tablet (10 mg total) by mouth daily. 30 tablet 0  gabapentin (NEURONTIN) 100 MG capsule Take 2 capsules (200 mg total) by mouth 3 (three) times daily. 180 capsule 0   melatonin 3 MG TABS tablet Take 3 mg by mouth at bedtime.     acetaminophen (TYLENOL) 325 MG tablet Take 2 tablets (650 mg total) by mouth every 6 (six) hours as needed. (Patient not taking: Reported on 06/04/2021) 36 tablet 0   albuterol (VENTOLIN HFA) 108 (90 Base) MCG/ACT inhaler Inhale 2 puffs into the lungs every 6 (six) hours as needed for wheezing or shortness of breath.     calcium carbonate (TUMS - DOSED IN MG ELEMENTAL CALCIUM) 500 MG chewable tablet Chew 1 tablet by mouth daily as needed for indigestion or heartburn.     diclofenac Sodium (VOLTAREN) 1 % GEL Apply 2 g topically 4 (four) times daily. 100 g 0   ibuprofen (ADVIL) 200 MG tablet Take 400 mg by mouth every 6 (six) hours as needed for moderate pain or headache.     lidocaine (LIDODERM) 5 % Place 1 patch onto the skin daily as needed. Remove & Discard patch within 12 hours or as directed by MD (Patient taking differently: Place 1 patch onto the skin daily as needed (pain). Remove & Discard patch within 12 hours or as directed by MD) 15 patch 0   ondansetron (ZOFRAN) 4 MG tablet Take 4 mg by mouth every 8 (eight) hours as needed for nausea or vomiting.     pantoprazole (PROTONIX) 40 MG tablet TAKE 1 TABLET BY MOUTH DAILY (Patient taking differently: 40 mg daily.) 30 tablet 0   propranolol (INDERAL) 10 MG tablet Take 1 tablet (10 mg total) by mouth 2 (two) times daily. 60 tablet 0    Labs  Lab Results:  Admission on 06/23/2021  Component Date Value Ref Range Status   SARS Coronavirus 2 by RT PCR 06/24/2021 NEGATIVE  NEGATIVE Final   Comment: (NOTE) SARS-CoV-2 target nucleic acids are NOT DETECTED.  The SARS-CoV-2 RNA is generally detectable in upper respiratory specimens during  the acute phase of infection. The lowest concentration of SARS-CoV-2 viral copies this assay can detect is 138 copies/mL. A negative result does not preclude SARS-Cov-2 infection and should not be used as the sole basis for treatment or other patient management decisions. A negative result may occur with  improper specimen collection/handling, submission of specimen other than nasopharyngeal swab, presence of viral mutation(s) within the areas targeted by this assay, and inadequate number of viral copies(<138 copies/mL). A negative result must be combined with clinical observations, patient history, and epidemiological information. The expected result is Negative.  Fact Sheet for Patients:  EntrepreneurPulse.com.au  Fact Sheet for Healthcare Providers:  IncredibleEmployment.be  This test is no                          t yet approved or cleared by the [place] FDA and  has been authorized for detection and/or diagnosis of SARS-CoV-2 by FDA under an Emergency Use Authorization (EUA). This EUA will remain  in effect (meaning this test can be used) for the duration of the COVID-19 declaration under Section 564(b)(1) of the Act, 21 U.S.C.section 360bbb-3(b)(1), unless the authorization is terminated  or revoked sooner.       Influenza A by PCR 06/24/2021 NEGATIVE  NEGATIVE Final   Influenza B by PCR 06/24/2021 NEGATIVE  NEGATIVE Final   Comment: (NOTE) The Xpert Xpress SARS-CoV-2/FLU/RSV plus assay is intended as an aid in the diagnosis  of influenza from Nasopharyngeal swab specimens and should not be used as a sole basis for treatment. Nasal washings and aspirates are unacceptable for Xpert Xpress SARS-CoV-2/FLU/RSV testing.  Fact Sheet for Patients: EntrepreneurPulse.com.au  Fact Sheet for Healthcare Providers: IncredibleEmployment.be  This test is not yet approved or cleared by the [place] FDA  and has been authorized for detection and/or diagnosis of SARS-CoV-2 by FDA under an Emergency Use Authorization (EUA). This EUA will remain in effect (meaning this test can be used) for the duration of the COVID-19 declaration under Section 564(b)(1) of the Act, 21 U.S.C. section 360bbb-3(b)(1), unless the authorization is terminated or revoked.  Performed at [facility], [place] [address], [place], [place] [address]    POC Amphetamine UR 06/24/2021 None Detected  NONE DETECTED (Cut Off Level 1000 ng/mL) Final   POC Secobarbital (BAR) 06/24/2021 None Detected  NONE DETECTED (Cut Off Level 300 ng/mL) Final   POC Buprenorphine (BUP) 06/24/2021 None Detected  NONE DETECTED (Cut Off Level 10 ng/mL) Final   POC Oxazepam (BZO) 06/24/2021 Positive (A)  NONE DETECTED (Cut Off Level 300 ng/mL) Final   POC Cocaine UR 06/24/2021 None Detected  NONE DETECTED (Cut Off Level 300 ng/mL) Final   POC Methamphetamine UR 06/24/2021 None Detected  NONE DETECTED (Cut Off Level 1000 ng/mL) Final   POC Morphine 06/24/2021 None Detected  NONE DETECTED (Cut Off Level 300 ng/mL) Final   POC Methadone UR 06/24/2021 None Detected  NONE DETECTED (Cut Off Level 300 ng/mL) Final   POC Oxycodone UR 06/24/2021 None Detected  NONE DETECTED (Cut Off Level 100 ng/mL) Final   POC Marijuana UR 06/24/2021 None Detected  NONE DETECTED (Cut Off Level 50 ng/mL) Final   SARSCOV2ONAVIRUS 2 AG 06/24/2021 NEGATIVE  NEGATIVE Final   Comment: (NOTE) SARS-CoV-2 antigen NOT DETECTED.   Negative results are presumptive.  Negative results do not preclude SARS-CoV-2 infection and should not be used as the sole basis for treatment or other patient management decisions, including infection  control decisions, particularly in the presence of clinical signs and  symptoms consistent with COVID-19, or in those who have been in contact with the virus.  Negative results must be combined with clinical observations, patient history, and  epidemiological information. The expected result is Negative.  Fact Sheet for Patients: HandmadeRecipes.com.cy  Fact Sheet for Healthcare Providers: FuneralLife.at  This test is not yet approved or cleared by the [place] FDA and  has been authorized for detection and/or diagnosis of SARS-CoV-2 by FDA under an Emergency Use Authorization (EUA).  This EUA will remain in effect (meaning this test can be used) for the duration of  the COV                          ID-19 declaration under Section 564(b)(1) of the Act, 21 U.S.C. section 360bbb-3(b)(1), unless the authorization is terminated or revoked sooner.     Preg Test, Ur 06/24/2021 NEGATIVE  NEGATIVE Final   Comment:        THE SENSITIVITY OF THIS METHODOLOGY IS >24 mIU/mL   Admission on 06/23/2021, Discharged on 06/23/2021  Component Date Value Ref Range Status   Preg Test, Ur 06/23/2021 NEGATIVE  NEGATIVE Final   Comment:        THE SENSITIVITY OF THIS METHODOLOGY IS >20 mIU/mL. Performed at [facility], [place] [name] [place], [place] [address]   Admission on 06/04/2021, Discharged on 06/04/2021  Component Date Value  Ref Range Status   Sodium 06/04/2021 138  135 - 145 mmol/L Final   Potassium 06/04/2021 3.4 (L)  3.5 - 5.1 mmol/L Final   Chloride 06/04/2021 108  98 - 111 mmol/L Final   CO2 06/04/2021 23  22 - 32 mmol/L Final   Glucose, Bld 06/04/2021 94  70 - 99 mg/dL Final   Glucose reference range applies only to samples taken after fasting for at least 8 hours.   BUN 06/04/2021 7  6 - 20 mg/dL Final   Creatinine, Ser 06/04/2021 0.73  0.44 - 1.00 mg/dL Final   Calcium 06/04/2021 9.1  8.9 - 10.3 mg/dL Final   Total Protein 06/04/2021 7.2  6.5 - 8.1 g/dL Final   Albumin 06/04/2021 4.0  3.5 - 5.0 g/dL Final   AST 06/04/2021 17  15 - 41 U/L Final   ALT 06/04/2021 14  0 - 44 U/L Final   Alkaline Phosphatase 06/04/2021 71  38 - 126 U/L Final   Total  Bilirubin 06/04/2021 0.4  0.3 - 1.2 mg/dL Final   GFR, Estimated 06/04/2021 >60  >60 mL/min Final   Comment: (NOTE) Calculated using the CKD-EPI Creatinine Equation (2021)    Anion gap 06/04/2021 7  5 - 15 Final   Performed at [place] [address], [place], [place] [address]   Salicylate Lvl 20/94/7096 <7.0 (L)  7.0 - 30.0 mg/dL Final   Performed at [facility] [address] [place], [place] [address]   Acetaminophen (Tylenol), Serum 06/04/2021 <10 (L)  10 - 30 ug/mL Final   Comment: (NOTE) Therapeutic concentrations vary significantly. A range of 10-30 ug/mL  may be an effective concentration for many patients. However, some  are best treated at concentrations outside of this range. Acetaminophen concentrations >150 ug/mL at 4 hours after ingestion  and >50 ug/mL at 12 hours after ingestion are often associated with  toxic reactions.  Performed at [facility], [place] [address], [place], [place] [address]    Alcohol, Ethyl (B) 06/04/2021 <10  <10 mg/dL Final   Comment: (NOTE) Lowest detectable limit for serum alcohol is 10 mg/dL.  For medical purposes only. Performed at [facility], [place] [address], [place], [place] [address]    Opiates 06/04/2021 NONE DETECTED  NONE DETECTED Final   Cocaine 06/04/2021 NONE DETECTED  NONE DETECTED Final   Benzodiazepines 06/04/2021 NONE DETECTED  NONE DETECTED Final   Amphetamines 06/04/2021 NONE DETECTED  NONE DETECTED Final   Tetrahydrocannabinol 06/04/2021 NONE DETECTED  NONE DETECTED Final   Barbiturates 06/04/2021 NONE DETECTED  NONE DETECTED Final   Comment: (NOTE) DRUG SCREEN FOR MEDICAL PURPOSES ONLY.  IF CONFIRMATION IS NEEDED FOR ANY PURPOSE, NOTIFY LAB WITHIN 5 DAYS.  LOWEST DETECTABLE LIMITS FOR URINE DRUG SCREEN Drug Class                     Cutoff (ng/mL) Amphetamine and metabolites    1000 Barbiturate and metabolites    200 Benzodiazepine                 354 Tricyclics and metabolites      300 Opiates and metabolites        300 Cocaine and metabolites        300 THC                            50 Performed at [facility], [address] [place], [place] [address]    WBC 06/04/2021 8.2  4.0 - 10.5 K/uL Final   RBC 06/04/2021 4.73  3.87 - 5.11 MIL/uL Final   Hemoglobin 06/04/2021 12.2  12.0 - 15.0 g/dL Final   HCT 06/04/2021 38.2  36.0 - 46.0 % Final   MCV 06/04/2021 80.8  80.0 - 100.0 fL Final   MCH 06/04/2021 25.8 (L)  26.0 - 34.0 pg Final   MCHC 06/04/2021 31.9  30.0 - 36.0 g/dL Final   RDW 06/04/2021 13.9  11.5 - 15.5 % Final   Platelets 06/04/2021 344  150 - 400 K/uL Final   nRBC 06/04/2021 0.0  0.0 - 0.2 % Final   Neutrophils Relative % 06/04/2021 53  % Final   Neutro Abs 06/04/2021 4.3  1.7 - 7.7 K/uL Final   Lymphocytes Relative 06/04/2021 38  % Final   Lymphs Abs 06/04/2021 3.1  0.7 - 4.0 K/uL Final   Monocytes Relative 06/04/2021 7  % Final   Monocytes Absolute 06/04/2021 0.6  0.1 - 1.0 K/uL Final   Eosinophils Relative 06/04/2021 1  % Final   Eosinophils Absolute 06/04/2021 0.1  0.0 - 0.5 K/uL Final   Basophils Relative 06/04/2021 1  % Final   Basophils Absolute 06/04/2021 0.1  0.0 - 0.1 K/uL Final   Immature Granulocytes 06/04/2021 0  % Final   Abs Immature Granulocytes 06/04/2021 0.03  0.00 - 0.07 K/uL Final   Performed at [facility], [place] [address], [place], [place] [address]   I-stat hCG, quantitative 06/04/2021 <5.0  <5 mIU/mL Final   Comment 3 06/04/2021          Final   Comment:   GEST. AGE      CONC.  (mIU/mL)   <=1 WEEK        5 - 50     2 WEEKS       50 - 500     3 WEEKS       100 - 10,000     4 WEEKS     1,000 - 30,000        FEMALE AND NON-PREGNANT FEMALE:     LESS THAN 5 mIU/mL    SARS Coronavirus 2 by RT PCR 06/04/2021 NEGATIVE  NEGATIVE Final   Comment: (NOTE) SARS-CoV-2 target nucleic acids are NOT DETECTED.  The SARS-CoV-2 RNA is generally detectable in upper respiratory specimens during the acute phase of  infection. The lowest concentration of SARS-CoV-2 viral copies this assay can detect is 138 copies/mL. A negative result does not preclude SARS-Cov-2 infection and should not be used as the sole basis for treatment or other patient management decisions. A negative result may occur with  improper specimen collection/handling, submission of specimen other than nasopharyngeal swab, presence of viral mutation(s) within the areas targeted by this assay, and inadequate number of viral copies(<138 copies/mL). A negative result must be combined with clinical observations, patient history, and epidemiological information. The expected result is Negative.  Fact Sheet for Patients:  EntrepreneurPulse.com.au  Fact Sheet for Healthcare Providers:  IncredibleEmployment.be  This test is no                          t yet approved or cleared by the [place] FDA and  has been authorized for detection and/or diagnosis of SARS-CoV-2 by FDA under an Emergency Use Authorization (EUA). This EUA will remain  in effect (meaning this test can  be used) for the duration of the COVID-19 declaration under Section 564(b)(1) of the Act, 21 U.S.C.section 360bbb-3(b)(1), unless the authorization is terminated  or revoked sooner.       Influenza A by PCR 06/04/2021 NEGATIVE  NEGATIVE Final   Influenza B by PCR 06/04/2021 NEGATIVE  NEGATIVE Final   Comment: (NOTE) The Xpert Xpress SARS-CoV-2/FLU/RSV plus assay is intended as an aid in the diagnosis of influenza from Nasopharyngeal swab specimens and should not be used as a sole basis for treatment. Nasal washings and aspirates are unacceptable for Xpert Xpress SARS-CoV-2/FLU/RSV testing.  Fact Sheet for Patients: EntrepreneurPulse.com.au  Fact Sheet for Healthcare Providers: IncredibleEmployment.be  This test is not yet approved or cleared by the [place] FDA and has been  authorized for detection and/or diagnosis of SARS-CoV-2 by FDA under an Emergency Use Authorization (EUA). This EUA will remain in effect (meaning this test can be used) for the duration of the COVID-19 declaration under Section 564(b)(1) of the Act, 21 U.S.C. section 360bbb-3(b)(1), unless the authorization is terminated or revoked.  Performed at [facility], [place] [address], [place], [place] [address]   Admission on 05/29/2021, Discharged on 05/30/2021  Component Date Value Ref Range Status   SARS Coronavirus 2 by RT PCR 05/29/2021 NEGATIVE  NEGATIVE Final   Comment: (NOTE) SARS-CoV-2 target nucleic acids are NOT DETECTED.  The SARS-CoV-2 RNA is generally detectable in upper respiratory specimens during the acute phase of infection. The lowest concentration of SARS-CoV-2 viral copies this assay can detect is 138 copies/mL. A negative result does not preclude SARS-Cov-2 infection and should not be used as the sole basis for treatment or other patient management decisions. A negative result may occur with  improper specimen collection/handling, submission of specimen other than nasopharyngeal swab, presence of viral mutation(s) within the areas targeted by this assay, and inadequate number of viral copies(<138 copies/mL). A negative result must be combined with clinical observations, patient history, and epidemiological information. The expected result is Negative.  Fact Sheet for Patients:  EntrepreneurPulse.com.au  Fact Sheet for Healthcare Providers:  IncredibleEmployment.be  This test is no                          t yet approved or cleared by the [place] FDA and  has been authorized for detection and/or diagnosis of SARS-CoV-2 by FDA under an Emergency Use Authorization (EUA). This EUA will remain  in effect (meaning this test can be used) for the duration of the COVID-19 declaration under Section 564(b)(1) of the Act,  21 U.S.C.section 360bbb-3(b)(1), unless the authorization is terminated  or revoked sooner.       Influenza A by PCR 05/29/2021 NEGATIVE  NEGATIVE Final   Influenza B by PCR 05/29/2021 NEGATIVE  NEGATIVE Final   Comment: (NOTE) The Xpert Xpress SARS-CoV-2/FLU/RSV plus assay is intended as an aid in the diagnosis of influenza from Nasopharyngeal swab specimens and should not be used as a sole basis for treatment. Nasal washings and aspirates are unacceptable for Xpert Xpress SARS-CoV-2/FLU/RSV testing.  Fact Sheet for Patients: EntrepreneurPulse.com.au  Fact Sheet for Healthcare Providers: IncredibleEmployment.be  This test is not yet approved or cleared by the [place] FDA and has been authorized for detection and/or diagnosis of SARS-CoV-2 by FDA under an Emergency Use Authorization (EUA). This EUA will remain in effect (meaning this test can be used) for the duration of the COVID-19 declaration under Section 564(b)(1) of the Act, 21 U.S.C.  section 360bbb-3(b)(1), unless the authorization is terminated or revoked.  Performed at [facility], [place] [address] [place], [place] [address]    Color, Urine 05/29/2021 STRAW (A)  YELLOW Final   APPearance 05/29/2021 CLEAR  CLEAR Final   Specific Gravity, Urine 05/29/2021 1.010  1.005 - 1.030 Final   pH 05/29/2021 6.0  5.0 - 8.0 Final   Glucose, UA 05/29/2021 NEGATIVE  NEGATIVE mg/dL Final   Hgb urine dipstick 05/29/2021 NEGATIVE  NEGATIVE Final   Bilirubin Urine 05/29/2021 NEGATIVE  NEGATIVE Final   Ketones, ur 05/29/2021 NEGATIVE  NEGATIVE mg/dL Final   Protein, ur 05/29/2021 NEGATIVE  NEGATIVE mg/dL Final   Nitrite 05/29/2021 NEGATIVE  NEGATIVE Final   Leukocytes,Ua 05/29/2021 NEGATIVE  NEGATIVE Final   RBC / HPF 05/29/2021 0-5  0 - 5 RBC/hpf Final   WBC, UA 05/29/2021 0-5  0 - 5 WBC/hpf Final   Bacteria, UA 05/29/2021 NONE SEEN  NONE SEEN Final   Squamous Epithelial / LPF  05/29/2021 0-5  0 - 5 Final   Mucus 05/29/2021 PRESENT   Final   Performed at [facility], [place] [address], [place], [place] [address]   WBC 05/29/2021 8.7  4.0 - 10.5 K/uL Final   RBC 05/29/2021 4.56  3.87 - 5.11 MIL/uL Final   Hemoglobin 05/29/2021 12.1  12.0 - 15.0 g/dL Final   HCT 05/29/2021 37.8  36.0 - 46.0 % Final   MCV 05/29/2021 82.9  80.0 - 100.0 fL Final   MCH 05/29/2021 26.5  26.0 - 34.0 pg Final   MCHC 05/29/2021 32.0  30.0 - 36.0 g/dL Final   RDW 05/29/2021 14.6  11.5 - 15.5 % Final   Platelets 05/29/2021 205  150 - 400 K/uL Final   nRBC 05/29/2021 0.0  0.0 - 0.2 % Final   Performed at [facility] [address] [place], [place] [address]   Sodium 05/29/2021 138  135 - 145 mmol/L Final   Potassium 05/29/2021 3.7  3.5 - 5.1 mmol/L Final   Chloride 05/29/2021 108  98 - 111 mmol/L Final   CO2 05/29/2021 22  22 - 32 mmol/L Final   Glucose, Bld 05/29/2021 121 (H)  70 - 99 mg/dL Final   Glucose reference range applies only to samples taken after fasting for at least 8 hours.   BUN 05/29/2021 8  6 - 20 mg/dL Final   Creatinine, Ser 05/29/2021 0.66  0.44 - 1.00 mg/dL Final   Calcium 05/29/2021 9.3  8.9 - 10.3 mg/dL Final   GFR, Estimated 05/29/2021 >60  >60 mL/min Final   Comment: (NOTE) Calculated using the CKD-EPI Creatinine Equation (2021)    Anion gap 05/29/2021 8  5 - 15 Final   Performed at [facility], [place] [address], [place], [place] [address]   I-stat hCG, quantitative 05/30/2021 <5.0  <5 mIU/mL Final   Comment 3 05/30/2021          Final   Comment:   GEST. AGE      CONC.  (mIU/mL)   <=1 WEEK        5 - 50     2 WEEKS       50 - 500     3 WEEKS       100 - 10,000     4 WEEKS     1,000 - 30,000        FEMALE AND NON-PREGNANT FEMALE:     LESS THAN 5 mIU/mL  Admission on 05/01/2021, Discharged on 05/01/2021  Component Date Value Ref Range Status   SARS Coronavirus 2 by RT PCR 05/01/2021 NEGATIVE  NEGATIVE Final   Comment: (NOTE) SARS-CoV-2  target nucleic acids are NOT DETECTED.  The SARS-CoV-2 RNA is generally detectable in upper respiratory specimens during the acute phase of infection. The lowest concentration of SARS-CoV-2 viral copies this assay can detect is 138 copies/mL. A negative result does not preclude SARS-Cov-2 infection and should not be used as the sole basis for treatment or other patient management decisions. A negative result may occur with  improper specimen collection/handling, submission of specimen other than nasopharyngeal swab, presence of viral mutation(s) within the areas targeted by this assay, and inadequate number of viral copies(<138 copies/mL). A negative result must be combined with clinical observations, patient history, and epidemiological information. The expected result is Negative.  Fact Sheet for Patients:  EntrepreneurPulse.com.au  Fact Sheet for Healthcare Providers:  IncredibleEmployment.be  This test is no                          t yet approved or cleared by the [place] FDA and  has been authorized for detection and/or diagnosis of SARS-CoV-2 by FDA under an Emergency Use Authorization (EUA). This EUA will remain  in effect (meaning this test can be used) for the duration of the COVID-19 declaration under Section 564(b)(1) of the Act, 21 U.S.C.section 360bbb-3(b)(1), unless the authorization is terminated  or revoked sooner.       Influenza A by PCR 05/01/2021 NEGATIVE  NEGATIVE Final   Influenza B by PCR 05/01/2021 NEGATIVE  NEGATIVE Final   Comment: (NOTE) The Xpert Xpress SARS-CoV-2/FLU/RSV plus assay is intended as an aid in the diagnosis of influenza from Nasopharyngeal swab specimens and should not be used as a sole basis for treatment. Nasal washings and aspirates are unacceptable for Xpert Xpress SARS-CoV-2/FLU/RSV testing.  Fact Sheet for Patients: EntrepreneurPulse.com.au  Fact Sheet for Healthcare  Providers: IncredibleEmployment.be  This test is not yet approved or cleared by the [place] FDA and has been authorized for detection and/or diagnosis of SARS-CoV-2 by FDA under an Emergency Use Authorization (EUA). This EUA will remain in effect (meaning this test can be used) for the duration of the COVID-19 declaration under Section 564(b)(1) of the Act, 21 U.S.C. section 360bbb-3(b)(1), unless the authorization is terminated or revoked.  Performed at [facility], [place] [address], [place], [place] [address]    WBC 05/01/2021 12.7 (H)  4.0 - 10.5 K/uL Final   RBC 05/01/2021 5.28 (H)  3.87 - 5.11 MIL/uL Final   Hemoglobin 05/01/2021 13.9  12.0 - 15.0 g/dL Final   HCT 05/01/2021 41.9  36.0 - 46.0 % Final   MCV 05/01/2021 79.4 (L)  80.0 - 100.0 fL Final   MCH 05/01/2021 26.3  26.0 - 34.0 pg Final   MCHC 05/01/2021 33.2  30.0 - 36.0 g/dL Final   RDW 05/01/2021 13.4  11.5 - 15.5 % Final   Platelets 05/01/2021 351  150 - 400 K/uL Final   nRBC 05/01/2021 0.0  0.0 - 0.2 % Final   Neutrophils Relative % 05/01/2021 66  % Final   Neutro Abs 05/01/2021 8.4 (H)  1.7 - 7.7 K/uL Final   Lymphocytes Relative 05/01/2021 27  % Final   Lymphs Abs 05/01/2021 3.4  0.7 - 4.0 K/uL Final   Monocytes Relative 05/01/2021 5  % Final   Monocytes Absolute 05/01/2021 0.7  0.1 - 1.0 K/uL Final   Eosinophils Relative 05/01/2021 1  % Final   Eosinophils Absolute 05/01/2021 0.1  0.0 - 0.5 K/uL Final   Basophils Relative 05/01/2021 0  % Final   Basophils Absolute 05/01/2021 0.1  0.0 - 0.1 K/uL Final   Immature Granulocytes 05/01/2021 1  % Final   Abs Immature Granulocytes 05/01/2021 0.07  0.00 - 0.07 K/uL Final   Performed at [facility], [place] [address] [place], [place] [address]   Sodium 05/01/2021 137  135 - 145 mmol/L Final   Potassium 05/01/2021 3.7  3.5 - 5.1 mmol/L Final   Chloride 05/01/2021 107  98 - 111 mmol/L Final   CO2 05/01/2021 21 (L)  22 - 32 mmol/L Final    Glucose, Bld 05/01/2021 96  70 - 99 mg/dL Final   Glucose reference range applies only to samples taken after fasting for at least 8 hours.   BUN 05/01/2021 9  6 - 20 mg/dL Final   Creatinine, Ser 05/01/2021 0.73  0.44 - 1.00 mg/dL Final   Calcium 05/01/2021 9.5  8.9 - 10.3 mg/dL Final   Total Protein 05/01/2021 7.8  6.5 - 8.1 g/dL Final   Albumin 05/01/2021 4.4  3.5 - 5.0 g/dL Final   AST 05/01/2021 18  15 - 41 U/L Final   ALT 05/01/2021 24  0 - 44 U/L Final   Alkaline Phosphatase 05/01/2021 70  38 - 126 U/L Final   Total Bilirubin 05/01/2021 0.6  0.3 - 1.2 mg/dL Final   GFR, Estimated 05/01/2021 >60  >60 mL/min Final   Comment: (NOTE) Calculated using the CKD-EPI Creatinine Equation (2021)    Anion gap 05/01/2021 9  5 - 15 Final   Performed at [place] [address] [place], [place] [address]   Alcohol, Ethyl (B) 05/01/2021 <10  <10 mg/dL Final   Comment: (NOTE) Lowest detectable limit for serum alcohol is 10 mg/dL.  For medical purposes only. Performed at [facility], [place] [address] [place],  [address]    Cholesterol 05/01/2021 245 (H)  0 - 200 mg/dL Final   Triglycerides 05/01/2021 169 (H)  <150 mg/dL Final   HDL 05/01/2021 46  >40 mg/dL Final   Total CHOL/HDL Ratio 05/01/2021 5.3  RATIO Final   VLDL 05/01/2021 34  0 - 40 mg/dL Final   LDL Cholesterol 05/01/2021 165 (H)  0 - 99 mg/dL Final   Comment:        Total Cholesterol/HDL:CHD Risk Coronary Heart Disease Risk Table                     Men   Women  1/2 Average Risk   3.4   3.3  Average Risk       5.0   4.4  2 X Average Risk   9.6   7.1  3 X Average Risk  23.4   11.0        Use the calculated Patient Ratio above and the CHD Risk Table to determine the patient's CHD Risk.        ATP III CLASSIFICATION (LDL):  <100     mg/dL   Optimal  100-129  mg/dL   Near or Above                    Optimal  130-159  mg/dL   Borderline  160-189  mg/dL   High  >190     mg/dL  Very  High Performed at [facility], [place] [address] [place], [place] [address]    TSH 05/01/2021 2.572  0.350 - 4.500 uIU/mL Final   Comment: Performed by a 3rd Generation assay with a functional sensitivity of <=0.01 uIU/mL. Performed at [facility], [place] [address] [place], [place] [address]    SARS Coronavirus 2 Ag 05/01/2021 Negative (NE)  Negative Final   SARSCOV2ONAVIRUS 2 AG 05/01/2021 NEGATIVE  NEGATIVE Final   Comment: (NOTE) SARS-CoV-2 antigen NOT DETECTED.   Negative results are presumptive.  Negative results do not preclude SARS-CoV-2 infection and should not be used as the sole basis for treatment or other patient management decisions, including infection  control decisions, particularly in the presence of clinical signs and  symptoms consistent with COVID-19, or in those who have been in contact with the virus.  Negative results must be combined with clinical observations, patient history, and epidemiological information. The expected result is Negative.  Fact Sheet for Patients: HandmadeRecipes.com.cy  Fact Sheet for Healthcare Providers: FuneralLife.at  This test is not yet approved or cleared by the [place] FDA and  has been authorized for detection and/or diagnosis of SARS-CoV-2 by FDA under an Emergency Use Authorization (EUA).  This EUA will remain in effect (meaning this test can be used) for the duration of  the COV                          ID-19 declaration under Section 564(b)(1) of the Act, 21 U.S.C. section 360bbb-3(b)(1), unless the authorization is terminated or revoked sooner.    Admission on 04/21/2021, Discharged on 04/26/2021  Component Date Value Ref Range Status   Color, Urine 04/22/2021 YELLOW  YELLOW Final   APPearance 04/22/2021 HAZY (A)  CLEAR Final   Specific Gravity, Urine 04/22/2021 1.016  1.005 - 1.030 Final   pH 04/22/2021 7.0  5.0 - 8.0 Final   Glucose, UA 04/22/2021  NEGATIVE  NEGATIVE mg/dL Final   Hgb urine dipstick 04/22/2021 NEGATIVE  NEGATIVE Final   Bilirubin Urine 04/22/2021 NEGATIVE  NEGATIVE Final   Ketones, ur 04/22/2021 NEGATIVE  NEGATIVE mg/dL Final   Protein, ur 04/22/2021 NEGATIVE  NEGATIVE mg/dL Final   Nitrite 04/22/2021 NEGATIVE  NEGATIVE Final   Leukocytes,Ua 04/22/2021 NEGATIVE  NEGATIVE Final   Performed at [place] [address], [place], [place] [address]   Cholesterol 04/24/2021 190  0 - 200 mg/dL Final   Triglycerides 04/24/2021 220 (H)  <150 mg/dL Final   HDL 04/24/2021 44  >40 mg/dL Final   Total CHOL/HDL Ratio 04/24/2021 4.3  RATIO Final   VLDL 04/24/2021 44 (H)  0 - 40 mg/dL Final   LDL Cholesterol 04/24/2021 102 (H)  0 - 99 mg/dL Final   Comment:        Total Cholesterol/HDL:CHD Risk Coronary Heart Disease Risk Table                     Men   Women  1/2 Average Risk   3.4   3.3  Average Risk       5.0   4.4  2 X Average Risk   9.6   7.1  3 X Average Risk  23.4   11.0        Use the calculated Patient Ratio above and the CHD Risk Table to determine the patient's CHD Risk.        ATP III CLASSIFICATION (LDL):  <  100     mg/dL   Optimal  100-129  mg/dL   Near or Above                    Optimal  130-159  mg/dL   Borderline  160-189  mg/dL   High  >190     mg/dL   Very High Performed at [facility] [address], [place], [place] [address]   Admission on 04/20/2021, Discharged on 04/21/2021  Component Date Value Ref Range Status   SARS Coronavirus 2 by RT PCR 04/21/2021 NEGATIVE  NEGATIVE Final   Comment: (NOTE) SARS-CoV-2 target nucleic acids are NOT DETECTED.  The SARS-CoV-2 RNA is generally detectable in upper respiratory specimens during the acute phase of infection. The lowest concentration of SARS-CoV-2 viral copies this assay can detect is 138 copies/mL. A negative result does not preclude SARS-Cov-2 infection and should not be used as the sole basis for  treatment or other patient management decisions. A negative result may occur with  improper specimen collection/handling, submission of specimen other than nasopharyngeal swab, presence of viral mutation(s) within the areas targeted by this assay, and inadequate number of viral copies(<138 copies/mL). A negative result must be combined with clinical observations, patient history, and epidemiological information. The expected result is Negative.  Fact Sheet for Patients:  EntrepreneurPulse.com.au  Fact Sheet for Healthcare Providers:  IncredibleEmployment.be  This test is no                          t yet approved or cleared by the [place] FDA and  has been authorized for detection and/or diagnosis of SARS-CoV-2 by FDA under an Emergency Use Authorization (EUA). This EUA will remain  in effect (meaning this test can be used) for the duration of the COVID-19 declaration under Section 564(b)(1) of the Act, 21 U.S.C.section 360bbb-3(b)(1), unless the authorization is terminated  or revoked sooner.       Influenza A by PCR 04/21/2021 NEGATIVE  NEGATIVE Final   Influenza B by PCR 04/21/2021 NEGATIVE  NEGATIVE Final   Comment: (NOTE) The Xpert Xpress SARS-CoV-2/FLU/RSV plus assay is intended as an aid in the diagnosis of influenza from Nasopharyngeal swab specimens and should not be used as a sole basis for treatment. Nasal washings and aspirates are unacceptable for Xpert Xpress SARS-CoV-2/FLU/RSV testing.  Fact Sheet for Patients: EntrepreneurPulse.com.au  Fact Sheet for Healthcare Providers: IncredibleEmployment.be  This test is not yet approved or cleared by the [place] FDA and has been authorized for detection and/or diagnosis of SARS-CoV-2 by FDA under an Emergency Use Authorization (EUA). This EUA will remain in effect (meaning this test can be used) for the duration of the COVID-19  declaration under Section 564(b)(1) of the Act, 21 U.S.C. section 360bbb-3(b)(1), unless the authorization is terminated or revoked.  Performed at [facility], [place] [address], [place], [place] [address]    WBC 04/21/2021 9.0  4.0 - 10.5 K/uL Final   RBC 04/21/2021 4.63  3.87 - 5.11 MIL/uL Final   Hemoglobin 04/21/2021 12.0  12.0 - 15.0 g/dL Final   HCT 04/21/2021 37.2  36.0 - 46.0 % Final   MCV 04/21/2021 80.3  80.0 - 100.0 fL Final   MCH 04/21/2021 25.9 (L)  26.0 - 34.0 pg Final   MCHC 04/21/2021 32.3  30.0 - 36.0 g/dL Final   RDW 04/21/2021 13.4  11.5 - 15.5 % Final   Platelets 04/21/2021 275  150 - 400 K/uL Final   nRBC 04/21/2021 0.0  0.0 - 0.2 % Final   Neutrophils Relative % 04/21/2021 64  % Final   Neutro Abs 04/21/2021 5.6  1.7 - 7.7 K/uL Final   Lymphocytes Relative 04/21/2021 30  % Final   Lymphs Abs 04/21/2021 2.7  0.7 - 4.0 K/uL Final   Monocytes Relative 04/21/2021 6  % Final   Monocytes Absolute 04/21/2021 0.6  0.1 - 1.0 K/uL Final   Eosinophils Relative 04/21/2021 0  % Final   Eosinophils Absolute 04/21/2021 0.0  0.0 - 0.5 K/uL Final   Basophils Relative 04/21/2021 0  % Final   Basophils Absolute 04/21/2021 0.0  0.0 - 0.1 K/uL Final   Immature Granulocytes 04/21/2021 0  % Final   Abs Immature Granulocytes 04/21/2021 0.03  0.00 - 0.07 K/uL Final   Performed at [facility], [place] [address] [place], [place] [address]   Sodium 04/21/2021 137  135 - 145 mmol/L Final   Potassium 04/21/2021 3.8  3.5 - 5.1 mmol/L Final   Chloride 04/21/2021 105  98 - 111 mmol/L Final   CO2 04/21/2021 23  22 - 32 mmol/L Final   Glucose, Bld 04/21/2021 97  70 - 99 mg/dL Final   Glucose reference range applies only to samples taken after fasting for at least 8 hours.   BUN 04/21/2021 11  6 - 20 mg/dL Final   Creatinine, Ser 04/21/2021 0.72  0.44 - 1.00 mg/dL Final   Calcium 04/21/2021 9.3  8.9 - 10.3 mg/dL Final   Total Protein 04/21/2021 7.2  6.5 - 8.1 g/dL Final   Albumin  04/21/2021 4.1  3.5 - 5.0 g/dL Final   AST 04/21/2021 16  15 - 41 U/L Final   ALT 04/21/2021 15  0 - 44 U/L Final   Alkaline Phosphatase 04/21/2021 68  38 - 126 U/L Final   Total Bilirubin 04/21/2021 0.2 (L)  0.3 - 1.2 mg/dL Final   GFR, Estimated 04/21/2021 >60  >60 mL/min Final   Comment: (NOTE) Calculated using the CKD-EPI Creatinine Equation (2021)    Anion gap 04/21/2021 9  5 - 15 Final   Performed at [facility] [address], [place], [place] [address]   POC Amphetamine UR 04/21/2021 None Detected  NONE DETECTED (Cut Off Level 1000 ng/mL) Final   POC Secobarbital (BAR) 04/21/2021 None Detected  NONE DETECTED (Cut Off Level 300 ng/mL) Final   POC Buprenorphine (BUP) 04/21/2021 None Detected  NONE DETECTED (Cut Off Level 10 ng/mL) Final   POC Oxazepam (BZO) 04/21/2021 None Detected  NONE DETECTED (Cut Off Level 300 ng/mL) Final   POC Cocaine UR 04/21/2021 None Detected  NONE DETECTED (Cut Off Level 300 ng/mL) Final   POC Methamphetamine UR 04/21/2021 None Detected  NONE DETECTED (Cut Off Level 1000 ng/mL) Final   POC Morphine 04/21/2021 None Detected  NONE DETECTED (Cut Off Level 300 ng/mL) Final   POC Oxycodone UR 04/21/2021 None Detected  NONE DETECTED (Cut Off Level 100 ng/mL) Final   POC Methadone UR 04/21/2021 None Detected  NONE DETECTED (Cut Off Level 300 ng/mL) Final   POC Marijuana UR 04/21/2021 None Detected  NONE DETECTED (Cut Off Level 50 ng/mL) Final   SARS Coronavirus 2 Ag 04/21/2021 Negative  Negative Preliminary   Preg Test, Ur 04/21/2021 NEGATIVE  NEGATIVE Final   Comment:        THE SENSITIVITY OF THIS METHODOLOGY IS >24 mIU/mL    SARSCOV2ONAVIRUS 2 AG 04/21/2021  NEGATIVE  NEGATIVE Final   Comment: (NOTE) SARS-CoV-2 antigen NOT DETECTED.   Negative results are presumptive.  Negative results do not preclude SARS-CoV-2 infection and should not be used as the sole basis for treatment or other patient management decisions, including infection  control  decisions, particularly in the presence of clinical signs and  symptoms consistent with COVID-19, or in those who have been in contact with the virus.  Negative results must be combined with clinical observations, patient history, and epidemiological information. The expected result is Negative.  Fact Sheet for Patients: HandmadeRecipes.com.cy  Fact Sheet for Healthcare Providers: FuneralLife.at  This test is not yet approved or cleared by the [place] FDA and  has been authorized for detection and/or diagnosis of SARS-CoV-2 by FDA under an Emergency Use Authorization (EUA).  This EUA will remain in effect (meaning this test can be used) for the duration of  the COV                          ID-19 declaration under Section 564(b)(1) of the Act, 21 U.S.C. section 360bbb-3(b)(1), unless the authorization is terminated or revoked sooner.    Admission on 03/11/2021, Discharged on 03/12/2021  Component Date Value Ref Range Status   Sodium 03/11/2021 139  135 - 145 mmol/L Final   Potassium 03/11/2021 3.5  3.5 - 5.1 mmol/L Final   Chloride 03/11/2021 109  98 - 111 mmol/L Final   CO2 03/11/2021 23  22 - 32 mmol/L Final   Glucose, Bld 03/11/2021 113 (H)  70 - 99 mg/dL Final   Glucose reference range applies only to samples taken after fasting for at least 8 hours.   BUN 03/11/2021 8  6 - 20 mg/dL Final   Creatinine, Ser 03/11/2021 0.68  0.44 - 1.00 mg/dL Final   Calcium 03/11/2021 8.9  8.9 - 10.3 mg/dL Final   GFR, Estimated 03/11/2021 >60  >60 mL/min Final   Comment: (NOTE) Calculated using the CKD-EPI Creatinine Equation (2021)    Anion gap 03/11/2021 7  5 - 15 Final   Performed at [facility], [place] [address] [place], [place] [address]   WBC 03/11/2021 7.9  4.0 - 10.5 K/uL Final   RBC 03/11/2021 4.72  3.87 - 5.11 MIL/uL Final   Hemoglobin 03/11/2021 12.3  12.0 - 15.0 g/dL Final   HCT 03/11/2021 39.1  36.0 - 46.0 % Final    MCV 03/11/2021 82.8  80.0 - 100.0 fL Final   MCH 03/11/2021 26.1  26.0 - 34.0 pg Final   MCHC 03/11/2021 31.5  30.0 - 36.0 g/dL Final   RDW 03/11/2021 13.6  11.5 - 15.5 % Final   Platelets 03/11/2021 250  150 - 400 K/uL Final   nRBC 03/11/2021 0.0  0.0 - 0.2 % Final   Neutrophils Relative % 03/11/2021 61  % Final   Neutro Abs 03/11/2021 4.9  1.7 - 7.7 K/uL Final   Lymphocytes Relative 03/11/2021 31  % Final   Lymphs Abs 03/11/2021 2.4  0.7 - 4.0 K/uL Final   Monocytes Relative 03/11/2021 6  % Final   Monocytes Absolute 03/11/2021 0.5  0.1 - 1.0 K/uL Final   Eosinophils Relative 03/11/2021 1  % Final   Eosinophils Absolute 03/11/2021 0.1  0.0 - 0.5 K/uL Final   Basophils Relative 03/11/2021 1  % Final   Basophils Absolute 03/11/2021 0.0  0.0 - 0.1 K/uL Final   Immature Granulocytes 03/11/2021 0  % Final  Abs Immature Granulocytes 03/11/2021 0.03  0.00 - 0.07 K/uL Final   Performed at [facility], [place] [address] [place], [place] [address]  Admission on 03/11/2021, Discharged on 03/11/2021  Component Date Value Ref Range Status   SARS Coronavirus 2 by RT PCR 03/11/2021 NEGATIVE  NEGATIVE Final   Comment: (NOTE) SARS-CoV-2 target nucleic acids are NOT DETECTED.  The SARS-CoV-2 RNA is generally detectable in upper respiratory specimens during the acute phase of infection. The lowest concentration of SARS-CoV-2 viral copies this assay can detect is 138 copies/mL. A negative result does not preclude SARS-Cov-2 infection and should not be used as the sole basis for treatment or other patient management decisions. A negative result may occur with  improper specimen collection/handling, submission of specimen other than nasopharyngeal swab, presence of viral mutation(s) within the areas targeted by this assay, and inadequate number of viral copies(<138 copies/mL). A negative result must be combined with clinical observations, patient history, and epidemiological information. The  expected result is Negative.  Fact Sheet for Patients:  EntrepreneurPulse.com.au  Fact Sheet for Healthcare Providers:  IncredibleEmployment.be  This test is no                          t yet approved or cleared by the [place] FDA and  has been authorized for detection and/or diagnosis of SARS-CoV-2 by FDA under an Emergency Use Authorization (EUA). This EUA will remain  in effect (meaning this test can be used) for the duration of the COVID-19 declaration under Section 564(b)(1) of the Act, 21 U.S.C.section 360bbb-3(b)(1), unless the authorization is terminated  or revoked sooner.       Influenza A by PCR 03/11/2021 NEGATIVE  NEGATIVE Final   Influenza B by PCR 03/11/2021 NEGATIVE  NEGATIVE Final   Comment: (NOTE) The Xpert Xpress SARS-CoV-2/FLU/RSV plus assay is intended as an aid in the diagnosis of influenza from Nasopharyngeal swab specimens and should not be used as a sole basis for treatment. Nasal washings and aspirates are unacceptable for Xpert Xpress SARS-CoV-2/FLU/RSV testing.  Fact Sheet for Patients: EntrepreneurPulse.com.au  Fact Sheet for Healthcare Providers: IncredibleEmployment.be  This test is not yet approved or cleared by the [place] FDA and has been authorized for detection and/or diagnosis of SARS-CoV-2 by FDA under an Emergency Use Authorization (EUA). This EUA will remain in effect (meaning this test can be used) for the duration of the COVID-19 declaration under Section 564(b)(1) of the Act, 21 U.S.C. section 360bbb-3(b)(1), unless the authorization is terminated or revoked.  Performed at [facility], [place] [address] [place], [place] [address]    WBC 03/11/2021 6.9  4.0 - 10.5 K/uL Final   RBC 03/11/2021 4.62  3.87 - 5.11 MIL/uL Final   Hemoglobin 03/11/2021 12.0  12.0 - 15.0 g/dL Final   HCT 03/11/2021 37.7  36.0 - 46.0 % Final   MCV 03/11/2021 81.6   80.0 - 100.0 fL Final   MCH 03/11/2021 26.0  26.0 - 34.0 pg Final   MCHC 03/11/2021 31.8  30.0 - 36.0 g/dL Final   RDW 03/11/2021 13.7  11.5 - 15.5 % Final   Platelets 03/11/2021 229  150 - 400 K/uL Final   nRBC 03/11/2021 0.0  0.0 - 0.2 % Final   Neutrophils Relative % 03/11/2021 57  % Final   Neutro Abs 03/11/2021 4.0  1.7 - 7.7 K/uL Final   Lymphocytes Relative 03/11/2021 34  % Final   Lymphs Abs  03/11/2021 2.3  0.7 - 4.0 K/uL Final   Monocytes Relative 03/11/2021 7  % Final   Monocytes Absolute 03/11/2021 0.5  0.1 - 1.0 K/uL Final   Eosinophils Relative 03/11/2021 1  % Final   Eosinophils Absolute 03/11/2021 0.1  0.0 - 0.5 K/uL Final   Basophils Relative 03/11/2021 1  % Final   Basophils Absolute 03/11/2021 0.0  0.0 - 0.1 K/uL Final   Immature Granulocytes 03/11/2021 0  % Final   Abs Immature Granulocytes 03/11/2021 0.03  0.00 - 0.07 K/uL Final   Performed at [facility], [place] [address] [place], [place] [address]   POC Amphetamine UR 03/11/2021 None Detected  NONE DETECTED (Cut Off Level 1000 ng/mL) Final   POC Secobarbital (BAR) 03/11/2021 None Detected  NONE DETECTED (Cut Off Level 300 ng/mL) Final   POC Buprenorphine (BUP) 03/11/2021 None Detected  NONE DETECTED (Cut Off Level 10 ng/mL) Final   POC Oxazepam (BZO) 03/11/2021 None Detected  NONE DETECTED (Cut Off Level 300 ng/mL) Final   POC Cocaine UR 03/11/2021 None Detected  NONE DETECTED (Cut Off Level 300 ng/mL) Final   POC Methamphetamine UR 03/11/2021 None Detected  NONE DETECTED (Cut Off Level 1000 ng/mL) Final   POC Morphine 03/11/2021 None Detected  NONE DETECTED (Cut Off Level 300 ng/mL) Final   POC Oxycodone UR 03/11/2021 None Detected  NONE DETECTED (Cut Off Level 100 ng/mL) Final   POC Methadone UR 03/11/2021 None Detected  NONE DETECTED (Cut Off Level 300 ng/mL) Final   POC Marijuana UR 03/11/2021 None Detected  NONE DETECTED (Cut Off Level 50 ng/mL) Final   SARSCOV2ONAVIRUS 2 AG 03/11/2021 NEGATIVE  NEGATIVE  Final   Comment: (NOTE) SARS-CoV-2 antigen NOT DETECTED.   Negative results are presumptive.  Negative results do not preclude SARS-CoV-2 infection and should not be used as the sole basis for treatment or other patient management decisions, including infection  control decisions, particularly in the presence of clinical signs and  symptoms consistent with COVID-19, or in those who have been in contact with the virus.  Negative results must be combined with clinical observations, patient history, and epidemiological information. The expected result is Negative.  Fact Sheet for Patients: HandmadeRecipes.com.cy  Fact Sheet for Healthcare Providers: FuneralLife.at  This test is not yet approved or cleared by the [place] FDA and  has been authorized for detection and/or diagnosis of SARS-CoV-2 by FDA under an Emergency Use Authorization (EUA).  This EUA will remain in effect (meaning this test can be used) for the duration of  the COV                          ID-19 declaration under Section 564(b)(1) of the Act, 21 U.S.C. section 360bbb-3(b)(1), unless the authorization is terminated or revoked sooner.     Preg Test, Ur 03/11/2021 NEGATIVE  NEGATIVE Final   Comment:        THE SENSITIVITY OF THIS METHODOLOGY IS >24 mIU/mL   Admission on 03/02/2021, Discharged on 03/04/2021  Component Date Value Ref Range Status   SARS Coronavirus 2 by RT PCR 03/02/2021 NEGATIVE  NEGATIVE Final   Comment: (NOTE) SARS-CoV-2 target nucleic acids are NOT DETECTED.  The SARS-CoV-2 RNA is generally detectable in upper respiratory specimens during the acute phase of infection. The lowest concentration of SARS-CoV-2 viral copies this assay can detect is 138 copies/mL. A negative result does not preclude SARS-Cov-2 infection and should not be used as the  sole basis for treatment or other patient management decisions. A negative result may occur with   improper specimen collection/handling, submission of specimen other than nasopharyngeal swab, presence of viral mutation(s) within the areas targeted by this assay, and inadequate number of viral copies(<138 copies/mL). A negative result must be combined with clinical observations, patient history, and epidemiological information. The expected result is Negative.  Fact Sheet for Patients:  EntrepreneurPulse.com.au  Fact Sheet for Healthcare Providers:  IncredibleEmployment.be  This test is no                          t yet approved or cleared by the [place] FDA and  has been authorized for detection and/or diagnosis of SARS-CoV-2 by FDA under an Emergency Use Authorization (EUA). This EUA will remain  in effect (meaning this test can be used) for the duration of the COVID-19 declaration under Section 564(b)(1) of the Act, 21 U.S.C.section 360bbb-3(b)(1), unless the authorization is terminated  or revoked sooner.       Influenza A by PCR 03/02/2021 NEGATIVE  NEGATIVE Final   Influenza B by PCR 03/02/2021 NEGATIVE  NEGATIVE Final   Comment: (NOTE) The Xpert Xpress SARS-CoV-2/FLU/RSV plus assay is intended as an aid in the diagnosis of influenza from Nasopharyngeal swab specimens and should not be used as a sole basis for treatment. Nasal washings and aspirates are unacceptable for Xpert Xpress SARS-CoV-2/FLU/RSV testing.  Fact Sheet for Patients: EntrepreneurPulse.com.au  Fact Sheet for Healthcare Providers: IncredibleEmployment.be  This test is not yet approved or cleared by the [place] FDA and has been authorized for detection and/or diagnosis of SARS-CoV-2 by FDA under an Emergency Use Authorization (EUA). This EUA will remain in effect (meaning this test can be used) for the duration of the COVID-19 declaration under Section 564(b)(1) of the Act, 21 U.S.C. section 360bbb-3(b)(1), unless  the authorization is terminated or revoked.  Performed at [facility], [place] [address], [place], [place] [address]    WBC 03/02/2021 7.6  4.0 - 10.5 K/uL Final   RBC 03/02/2021 4.99  3.87 - 5.11 MIL/uL Final   Hemoglobin 03/02/2021 13.0  12.0 - 15.0 g/dL Final   HCT 03/02/2021 40.3  36.0 - 46.0 % Final   MCV 03/02/2021 80.8  80.0 - 100.0 fL Final   MCH 03/02/2021 26.1  26.0 - 34.0 pg Final   MCHC 03/02/2021 32.3  30.0 - 36.0 g/dL Final   RDW 03/02/2021 13.4  11.5 - 15.5 % Final   Platelets 03/02/2021 272  150 - 400 K/uL Final   nRBC 03/02/2021 0.0  0.0 - 0.2 % Final   Neutrophils Relative % 03/02/2021 65  % Final   Neutro Abs 03/02/2021 4.9  1.7 - 7.7 K/uL Final   Lymphocytes Relative 03/02/2021 30  % Final   Lymphs Abs 03/02/2021 2.2  0.7 - 4.0 K/uL Final   Monocytes Relative 03/02/2021 5  % Final   Monocytes Absolute 03/02/2021 0.4  0.1 - 1.0 K/uL Final   Eosinophils Relative 03/02/2021 0  % Final   Eosinophils Absolute 03/02/2021 0.0  0.0 - 0.5 K/uL Final   Basophils Relative 03/02/2021 0  % Final   Basophils Absolute 03/02/2021 0.0  0.0 - 0.1 K/uL Final   Immature Granulocytes 03/02/2021 0  % Final   Abs Immature Granulocytes 03/02/2021 0.03  0.00 - 0.07 K/uL Final   Performed at [facility], [place] [address], [place], [place] [address]  Sodium 03/02/2021 137  135 - 145 mmol/L Final   Potassium 03/02/2021 3.9  3.5 - 5.1 mmol/L Final   Chloride 03/02/2021 106  98 - 111 mmol/L Final   CO2 03/02/2021 20 (L)  22 - 32 mmol/L Final   Glucose, Bld 03/02/2021 89  70 - 99 mg/dL Final   Glucose reference range applies only to samples taken after fasting for at least 8 hours.   BUN 03/02/2021 5 (L)  6 - 20 mg/dL Final   Creatinine, Ser 03/02/2021 0.69  0.44 - 1.00 mg/dL Final   Calcium 03/02/2021 9.5  8.9 - 10.3 mg/dL Final   Total Protein 03/02/2021 7.3  6.5 - 8.1 g/dL Final   Albumin 03/02/2021 4.3  3.5 - 5.0 g/dL Final   AST 03/02/2021 16  15 - 41 U/L Final   ALT  03/02/2021 16  0 - 44 U/L Final   Alkaline Phosphatase 03/02/2021 76  38 - 126 U/L Final   Total Bilirubin 03/02/2021 0.5  0.3 - 1.2 mg/dL Final   GFR, Estimated 03/02/2021 >60  >60 mL/min Final   Comment: (NOTE) Calculated using the CKD-EPI Creatinine Equation (2021)    Anion gap 03/02/2021 11  5 - 15 Final   Performed at [place] [address], [place], [place] [address]   Hgb A1c MFr Bld 03/02/2021 5.1  4.8 - 5.6 % Final   Comment: (NOTE) Pre diabetes:          5.7%-6.4%  Diabetes:              >6.4%  Glycemic control for   <7.0% adults with diabetes    Mean Plasma Glucose 03/02/2021 99.67  mg/dL Final   Performed at [facility], [place] [address], [place], [place] [address]   Magnesium 03/02/2021 2.1  1.7 - 2.4 mg/dL Final   Performed at [place] [address] [place], [place] [address]   Alcohol, Ethyl (B) 03/02/2021 <10  <10 mg/dL Final   Comment: (NOTE) Lowest detectable limit for serum alcohol is 10 mg/dL.  For medical purposes only. Performed at [facility], [place] [address] [place], [place] [address]    TSH 03/02/2021 2.216  0.350 - 4.500 uIU/mL Final   Comment: Performed by a 3rd Generation assay with a functional sensitivity of <=0.01 uIU/mL. Performed at [facility], [place] [address], [place], [place] [address]    Prolactin 03/02/2021 66.2 (H)  4.8 - 23.3 ng/mL Final   Comment: (NOTE) Performed At: [facility] [place], [place] [id number] Rush Farmer MD OI:[id number]    POC Amphetamine UR 03/02/2021 None Detected  NONE DETECTED (Cut Off Level 1000 ng/mL) Final   POC Secobarbital (BAR) 03/02/2021 None Detected  NONE DETECTED (Cut Off Level 300 ng/mL) Final   POC Buprenorphine (BUP) 03/02/2021 None Detected  NONE DETECTED (Cut Off Level 10 ng/mL) Final   POC Oxazepam (BZO) 03/02/2021 None Detected  NONE DETECTED (Cut Off Level 300 ng/mL) Final   POC Cocaine UR 03/02/2021 None Detected  NONE  DETECTED (Cut Off Level 300 ng/mL) Final   POC Methamphetamine UR 03/02/2021 None Detected  NONE DETECTED (Cut Off Level 1000 ng/mL) Final   POC Morphine 03/02/2021 None Detected  NONE DETECTED (Cut Off Level 300 ng/mL) Final   POC Oxycodone UR 03/02/2021 None Detected  NONE DETECTED (Cut Off Level 100 ng/mL) Final   POC Methadone UR 03/02/2021 None Detected  NONE DETECTED (Cut Off Level 300  ng/mL) Final   POC Marijuana UR 03/02/2021 None Detected  NONE DETECTED (Cut Off Level 50 ng/mL) Final   SARS Coronavirus 2 Ag 03/02/2021 Negative  Negative Preliminary   SARSCOV2ONAVIRUS 2 AG 03/02/2021 NEGATIVE  NEGATIVE Final   Comment: (NOTE) SARS-CoV-2 antigen NOT DETECTED.   Negative results are presumptive.  Negative results do not preclude SARS-CoV-2 infection and should not be used as the sole basis for treatment or other patient management decisions, including infection  control decisions, particularly in the presence of clinical signs and  symptoms consistent with COVID-19, or in those who have been in contact with the virus.  Negative results must be combined with clinical observations, patient history, and epidemiological information. The expected result is Negative.  Fact Sheet for Patients: HandmadeRecipes.com.cy  Fact Sheet for Healthcare Providers: FuneralLife.at  This test is not yet approved or cleared by the [place] FDA and  has been authorized for detection and/or diagnosis of SARS-CoV-2 by FDA under an Emergency Use Authorization (EUA).  This EUA will remain in effect (meaning this test can be used) for the duration of  the COV                          ID-19 declaration under Section 564(b)(1) of the Act, 21 U.S.C. section 360bbb-3(b)(1), unless the authorization is terminated or revoked sooner.     Preg Test, Ur 03/02/2021 NEGATIVE  NEGATIVE Final   Comment:        THE SENSITIVITY OF THIS METHODOLOGY IS >24 mIU/mL     Cholesterol 03/02/2021 227 (H)  0 - 200 mg/dL Final   Triglycerides 03/02/2021 147  <150 mg/dL Final   HDL 03/02/2021 45  >40 mg/dL Final   Total CHOL/HDL Ratio 03/02/2021 5.0  RATIO Final   VLDL 03/02/2021 29  0 - 40 mg/dL Final   LDL Cholesterol 03/02/2021 153 (H)  0 - 99 mg/dL Final   Comment:        Total Cholesterol/HDL:CHD Risk Coronary Heart Disease Risk Table                     Men   Women  1/2 Average Risk   3.4   3.3  Average Risk       5.0   4.4  2 X Average Risk   9.6   7.1  3 X Average Risk  23.4   11.0        Use the calculated Patient Ratio above and the CHD Risk Table to determine the patient's CHD Risk.        ATP III CLASSIFICATION (LDL):  <100     mg/dL   Optimal  100-129  mg/dL   Near or Above                    Optimal  130-159  mg/dL   Borderline  160-189  mg/dL   High  >190     mg/dL   Very High Performed at [facility] [address] [place], [place] [address]    Color, Urine 03/03/2021 STRAW (A)  YELLOW Final   APPearance 03/03/2021 CLEAR  CLEAR Final   Specific Gravity, Urine 03/03/2021 1.012  1.005 - 1.030 Final   pH 03/03/2021 6.0  5.0 - 8.0 Final   Glucose, UA 03/03/2021 NEGATIVE  NEGATIVE mg/dL Final   Hgb urine dipstick 03/03/2021 MODERATE (A)  NEGATIVE Final   Bilirubin Urine 03/03/2021 NEGATIVE  NEGATIVE Final   Ketones, ur 03/03/2021 NEGATIVE  NEGATIVE mg/dL Final   Protein, ur 03/03/2021 NEGATIVE  NEGATIVE mg/dL Final   Nitrite 03/03/2021 NEGATIVE  NEGATIVE Final   Leukocytes,Ua 03/03/2021 NEGATIVE  NEGATIVE Final   RBC / HPF 03/03/2021 0-5  0 - 5 RBC/hpf Final   WBC, UA 03/03/2021 0-5  0 - 5 WBC/hpf Final   Bacteria, UA 03/03/2021 NONE SEEN  NONE SEEN Final   Squamous Epithelial / LPF 03/03/2021 0-5  0 - 5 Final   Mucus 03/03/2021 PRESENT   Final   Performed at [facility], [place] [address] [place], [place] [address]  There may be more visits with results that are not included.    Blood Alcohol level:  Lab Results   Component Value Date   ETH <10 06/04/2021   ETH <10 78/58/8502    Metabolic Disorder Labs: Lab Results  Component Value Date   HGBA1C 5.1 03/02/2021   MPG 99.67 03/02/2021   MPG 96.8 07/02/2019   Lab Results  Component Value Date   PROLACTIN 66.2 (H) 03/02/2021   PROLACTIN 66.1 (H) 02/04/2020   Lab Results  Component Value Date   CHOL 245 (H) 05/01/2021   TRIG 169 (H) 05/01/2021   HDL 46 05/01/2021   CHOLHDL 5.3 05/01/2021   VLDL 34 05/01/2021   LDLCALC 165 (H) 05/01/2021   LDLCALC 102 (H) 04/24/2021    Therapeutic Lab Levels: No results found for: LITHIUM Lab Results  Component Value Date   VALPROATE 48 (L) 07/10/2015   VALPROATE 84 05/31/2015   No components found for:  CBMZ  Physical Findings   AIMS    Flowsheet Row Admission (Discharged) from 04/21/2021 in [place] [facility] Admission (Discharged) from 02/04/2020 in [place] [facility] Admission (Discharged) from 01/17/2020 in [place] [facility] Admission (Discharged) from 07/08/2019 in [place] [facility] Admission (Discharged) from 07/01/2019 in [place] [facility]  AIMS Total Score 0 0 0 0 0      AUDIT    Flowsheet Row Admission (Discharged) from 02/04/2020 in [facility] [facility] Admission (Discharged) from 01/17/2020 in [place] [facility] Admission (Discharged) from 07/08/2019 in [place] [facility] Admission (Discharged) from 07/01/2019 in [place] [facility] Admission (Discharged) from 03/05/2019 in [place] [facility]  Alcohol Use Disorder Identification Test Final Score (AUDIT) 0 0 0 0 0      GAD-7    Flowsheet Row Office Visit from 08/20/2016 in [place] for [facility] from 04/03/2016 in [place] for  [facility] Routine Prenatal from 03/14/2016 in [place] for [facility] Routine Prenatal from 03/08/2016 in [place] for [facility] Routine Prenatal from 03/01/2016 in [place] for [facility]  Total GAD-7 Score '1 19 13 9 5      '$ PHQ2-9    [place] ED from 05/01/2021 in [facility] ED from 02/03/2020 in [facility] ED from 07/26/2019 in [facility] Office Visit from 08/20/2016 in [place] for [facility] from 04/03/2016 in [place] for [facility]  PHQ-2 Total Score '5 6 5 '$ 0 3  PHQ-9 Total Score '17 27 15 1 16      '$ Flowsheet Row ED from 06/23/2021 in [facility] DEPT ED from 06/04/2021 in  [place] ED from 06/03/2021 in [place] Error: Q3, 4, or 5 should not be populated when Q2 is No High Risk Error: Q3, 4, or 5 should not be populated when Q2 is No        Musculoskeletal  Strength & Muscle Tone: within normal limits Gait & Station: normal Patient leans: N/A  Psychiatric Specialty Exam  Presentation  General Appearance: Appropriate for Environment  Eye Contact:Good  Speech:Pressured  Speech Volume:Normal  Handedness:Right   Mood and Affect  Mood:Anxious  Affect:Congruent   Thought Process  Thought Processes:Coherent  Descriptions of Associations:Intact  Orientation:Full (Time, Place and Person)  Thought Content:WDL  Diagnosis of Schizophrenia or Schizoaffective disorder in past: Yes  Duration of Psychotic Symptoms: Greater than six months   Hallucinations:Hallucinations: None  Ideas of Reference:None  Suicidal Thoughts:Suicidal Thoughts: Yes, Active SI Active Intent and/or Plan: With Plan  Homicidal Thoughts:Homicidal  Thoughts: No   Sensorium  Memory:Immediate Good; Recent Good; Remote Good  Judgment:Fair  Insight:Good   Executive Functions  Concentration:Fair  Attention Span:Fair  Recall:Good  Fund of Knowledge:Good  Language:Good   Psychomotor Activity  Psychomotor Activity:Psychomotor Activity: Normal   Assets  Assets:Desire for Improvement; Armed forces logistics/support/administrative officer; Housing   Sleep  Sleep:Sleep: Fair Number of Hours of Sleep: 6   Nutritional Assessment (For OBS and FBC admissions only) Has the patient had a weight loss or gain of 10 pounds or more in the last 3 months?: No Has the patient had a decrease in food intake/or appetite?: No Does the patient have dental problems?: No Does the patient have eating habits or behaviors that may be indicators of an eating disorder including binging or inducing vomiting?: No Has the patient recently lost weight without trying?: 0 Has the patient been eating poorly because of a decreased appetite?: 0 Malnutrition Screening Tool Score: 0    Physical Exam  Physical Exam Vitals and nursing note reviewed.  Cardiovascular:     Rate and Rhythm: Normal rate and regular rhythm.  Neurological:     Mental Status: She is alert and oriented to person, place, and time.  Psychiatric:        Mood and Affect: Mood normal.        Behavior: Behavior normal.        Thought Content: Thought content normal.   Review of Systems  Psychiatric/Behavioral:  Positive for depression and suicidal ideas. The patient is nervous/anxious.   All other systems reviewed and are negative. Blood pressure 114/73, pulse 80, temperature 98.5 F (36.9 C), temperature source Oral, resp. rate 18, SpO2 98 %. There is no height or weight on file to calculate BMI.  Treatment Plan Summary: Daily contact with patient to assess and evaluate symptoms and progress in treatment and Medication management  Continue Lexapro 10 mg daily Continue gabapentin 200 mg p.o. 3 times  daily Continue hydroxyzine 25 mg p.o. twice daily Continue Inderal 10 mg p.o. twice daily  Anticipated discharge 06/25/2021 "follow-up outpatient resources with ACT services.  Patient was receptive to plan  Derrill Center, NP 06/24/2021 12:18 PM

## 2021-06-26 ENCOUNTER — Emergency Department (HOSPITAL_COMMUNITY)
Admission: EM | Admit: 2021-06-26 | Discharge: 2021-06-27 | Disposition: A | Discharge status: Home / Self Care | Payer: PPO | Attending: Emergency Medicine | Admitting: Emergency Medicine

## 2021-06-26 ENCOUNTER — Other Ambulatory Visit: Payer: Self-pay

## 2021-06-26 DIAGNOSIS — T426X2A Poisoning by other antiepileptic and sedative-hypnotic drugs, intentional self-harm, initial encounter: Secondary | ICD-10-CM | POA: Insufficient documentation

## 2021-06-26 DIAGNOSIS — T447X2A Poisoning by beta-adrenoreceptor antagonists, intentional self-harm, initial encounter: Secondary | ICD-10-CM | POA: Insufficient documentation

## 2021-06-26 DIAGNOSIS — Z79899 Other long term (current) drug therapy: Secondary | ICD-10-CM | POA: Insufficient documentation

## 2021-06-26 DIAGNOSIS — R45851 Suicidal ideations: Secondary | ICD-10-CM | POA: Diagnosis not present

## 2021-06-26 DIAGNOSIS — T43212A Poisoning by selective serotonin and norepinephrine reuptake inhibitors, intentional self-harm, initial encounter: Secondary | ICD-10-CM | POA: Diagnosis not present

## 2021-06-26 DIAGNOSIS — F603 Borderline personality disorder: Secondary | ICD-10-CM | POA: Diagnosis present

## 2021-06-26 DIAGNOSIS — T1491XA Suicide attempt, initial encounter: Secondary | ICD-10-CM

## 2021-06-26 DIAGNOSIS — R9431 Abnormal electrocardiogram [ECG] [EKG]: Secondary | ICD-10-CM | POA: Diagnosis not present

## 2021-06-26 DIAGNOSIS — T50902A Poisoning by unspecified drugs, medicaments and biological substances, intentional self-harm, initial encounter: Secondary | ICD-10-CM

## 2021-06-26 DIAGNOSIS — F259 Schizoaffective disorder, unspecified: Secondary | ICD-10-CM | POA: Diagnosis not present

## 2021-06-26 DIAGNOSIS — T50904A Poisoning by unspecified drugs, medicaments and biological substances, undetermined, initial encounter: Secondary | ICD-10-CM | POA: Diagnosis not present

## 2021-06-26 DIAGNOSIS — X838XXA Intentional self-harm by other specified means, initial encounter: Secondary | ICD-10-CM | POA: Insufficient documentation

## 2021-06-26 DIAGNOSIS — F29 Unspecified psychosis not due to a substance or known physiological condition: Secondary | ICD-10-CM | POA: Diagnosis not present

## 2021-06-26 DIAGNOSIS — T887XXA Unspecified adverse effect of drug or medicament, initial encounter: Secondary | ICD-10-CM | POA: Diagnosis not present

## 2021-06-26 LAB — CBC WITH DIFFERENTIAL/PLATELET
Abs Immature Granulocytes: 0.03 10*3/uL (ref 0.00–0.07)
Abs Immature Granulocytes: 0.01 10*3/uL (ref 0.00–0.07)
Basophils Absolute: 0 10*3/uL (ref 0.0–0.1)
Basophils Absolute: 0 10*3/uL (ref 0.0–0.1)
Basophils Relative: 0 %
Basophils Relative: 0 %
Eosinophils Absolute: 0.1 10*3/uL (ref 0.0–0.5)
Eosinophils Absolute: 0.1 10*3/uL (ref 0.0–0.5)
Eosinophils Relative: 1 %
Eosinophils Relative: 1 %
HCT: 40.1 % (ref 36.0–46.0)
HCT: 32.4 % — ABNORMAL LOW (ref 36.0–46.0)
Hemoglobin: 12.9 g/dL (ref 12.0–15.0)
Hemoglobin: 10.3 g/dL — ABNORMAL LOW (ref 12.0–15.0)
Immature Granulocytes: 0 %
Immature Granulocytes: 0 %
Lymphocytes Relative: 32 %
Lymphocytes Relative: 38 %
Lymphs Abs: 3 10*3/uL (ref 0.7–4.0)
Lymphs Abs: 2.4 10*3/uL (ref 0.7–4.0)
MCH: 26.4 pg (ref 26.0–34.0)
MCH: 26.3 pg (ref 26.0–34.0)
MCHC: 32.2 g/dL (ref 30.0–36.0)
MCHC: 31.8 g/dL (ref 30.0–36.0)
MCV: 82 fL (ref 80.0–100.0)
MCV: 82.7 fL (ref 80.0–100.0)
Monocytes Absolute: 0.4 10*3/uL (ref 0.1–1.0)
Monocytes Absolute: 0.3 10*3/uL (ref 0.1–1.0)
Monocytes Relative: 5 %
Monocytes Relative: 5 %
Neutro Abs: 5.8 10*3/uL (ref 1.7–7.7)
Neutro Abs: 3.5 10*3/uL (ref 1.7–7.7)
Neutrophils Relative %: 62 %
Neutrophils Relative %: 56 %
Platelets: 298 10*3/uL (ref 150–400)
Platelets: 201 10*3/uL (ref 150–400)
RBC: 4.89 MIL/uL (ref 3.87–5.11)
RBC: 3.92 MIL/uL (ref 3.87–5.11)
RDW: 15.5 % (ref 11.5–15.5)
RDW: 15.5 % (ref 11.5–15.5)
WBC: 9.4 10*3/uL (ref 4.0–10.5)
WBC: 6.3 10*3/uL (ref 4.0–10.5)
nRBC: 0 % (ref 0.0–0.2)
nRBC: 0 % (ref 0.0–0.2)

## 2021-06-26 LAB — COMPREHENSIVE METABOLIC PANEL
ALT: 20 U/L (ref 0–44)
ALT: 15 U/L (ref 0–44)
AST: 21 U/L (ref 15–41)
AST: 15 U/L (ref 15–41)
Albumin: 4.1 g/dL (ref 3.5–5.0)
Albumin: 3.1 g/dL — ABNORMAL LOW (ref 3.5–5.0)
Alkaline Phosphatase: 60 U/L (ref 38–126)
Alkaline Phosphatase: 46 U/L (ref 38–126)
Anion gap: 7 (ref 5–15)
Anion gap: 2 — ABNORMAL LOW (ref 5–15)
BUN: 6 mg/dL (ref 6–20)
BUN: 5 mg/dL — ABNORMAL LOW (ref 6–20)
CO2: 22 mmol/L (ref 22–32)
CO2: 22 mmol/L (ref 22–32)
Calcium: 9.6 mg/dL (ref 8.9–10.3)
Calcium: 8 mg/dL — ABNORMAL LOW (ref 8.9–10.3)
Chloride: 109 mmol/L (ref 98–111)
Chloride: 114 mmol/L — ABNORMAL HIGH (ref 98–111)
Creatinine, Ser: 0.74 mg/dL (ref 0.44–1.00)
Creatinine, Ser: 0.62 mg/dL (ref 0.44–1.00)
GFR, Estimated: >60 mL/min (ref >60)
GFR, Estimated: >60 mL/min (ref >60)
Glucose, Bld: 111 mg/dL — ABNORMAL HIGH (ref 70–99)
Glucose, Bld: 99 mg/dL (ref 70–99)
Potassium: 4.1 mmol/L (ref 3.5–5.1)
Potassium: 3.7 mmol/L (ref 3.5–5.1)
Sodium: 138 mmol/L (ref 135–145)
Sodium: 138 mmol/L (ref 135–145)
Total Bilirubin: 0.6 mg/dL (ref 0.3–1.2)
Total Bilirubin: 0.5 mg/dL (ref 0.3–1.2)
Total Protein: 7.1 g/dL (ref 6.5–8.1)
Total Protein: 5.6 g/dL — ABNORMAL LOW (ref 6.5–8.1)

## 2021-06-26 LAB — RAPID URINE DRUG SCREEN, HOSP PERFORMED
Amphetamines: NOT DETECTED
Barbiturates: NOT DETECTED
Benzodiazepines: NOT DETECTED
Cocaine: NOT DETECTED
Opiates: NOT DETECTED
Tetrahydrocannabinol: NOT DETECTED

## 2021-06-26 LAB — ACETAMINOPHEN LEVEL
Acetaminophen (Tylenol), Serum: <10 ug/mL — ABNORMAL LOW (ref 10–30)
Acetaminophen (Tylenol), Serum: <10 ug/mL — ABNORMAL LOW (ref 10–30)

## 2021-06-26 LAB — CBG MONITORING, ED: Glucose-Capillary: 100 mg/dL — ABNORMAL HIGH (ref 70–99)

## 2021-06-26 LAB — MAGNESIUM: Magnesium: 1.8 mg/dL (ref 1.7–2.4)

## 2021-06-26 LAB — SALICYLATE LEVEL: Salicylate Lvl: <7 mg/dL — ABNORMAL LOW (ref 7.0–30.0)

## 2021-06-26 LAB — ETHANOL: Alcohol, Ethyl (B): <10 mg/dL (ref <10)

## 2021-06-26 LAB — I-STAT BETA HCG BLOOD, ED (MC, WL, AP ONLY): I-stat hCG, quantitative: <5 m[IU]/mL (ref <5)

## 2021-06-26 MED ORDER — LORAZEPAM 1 MG PO TABS
2.0000 mg | ORAL_TABLET | Freq: Once | ORAL | Status: DC
  Filled 2021-06-26: qty 2

## 2021-06-26 MED ORDER — LORAZEPAM 2 MG/ML IJ SOLN: 1.0000 mg | Freq: Once | INTRAMUSCULAR | Status: DC | PRN

## 2021-06-26 MED ORDER — PANTOPRAZOLE SODIUM 40 MG PO TBEC
40.0000 mg | DELAYED_RELEASE_TABLET | Freq: Every day | ORAL | Status: DC
Start: 2021-06-26 — End: 2021-06-27
  Administered 2021-06-26 – 2021-06-27 (×2): 40 mg via ORAL
  Filled 2021-06-26 (×2): qty 1

## 2021-06-26 MED ORDER — MELATONIN 3 MG PO TABS
3.0000 mg | ORAL_TABLET | Freq: Every evening | ORAL | Status: DC | PRN
  Administered 2021-06-27: 3 mg via ORAL
  Filled 2021-06-26: qty 1

## 2021-06-26 MED ORDER — IBUPROFEN 400 MG PO TABS
600.0000 mg | ORAL_TABLET | Freq: Four times a day (QID) | ORAL | Status: DC | PRN
  Administered 2021-06-26: 600 mg via ORAL
  Filled 2021-06-26: qty 1

## 2021-06-26 MED ORDER — ONDANSETRON HCL 4 MG/2ML IJ SOLN
4.0000 mg | Freq: Once | INTRAMUSCULAR | Status: AC
  Administered 2021-06-26: 4 mg via INTRAVENOUS
  Filled 2021-06-26: qty 2

## 2021-06-26 MED ORDER — MOMETASONE FURO-FORMOTEROL FUM 200-5 MCG/ACT IN AERO
2.0000 | INHALATION_SPRAY | Freq: Two times a day (BID) | RESPIRATORY_TRACT | Status: DC
Start: 2021-06-26 — End: 2021-06-27
  Administered 2021-06-27 (×2): 2 via RESPIRATORY_TRACT
  Filled 2021-06-26 (×2): qty 8.8

## 2021-06-26 MED ORDER — SODIUM CHLORIDE 0.9 % IV BOLUS
1000.0000 mL | Freq: Once | INTRAVENOUS | Status: AC
  Administered 2021-06-26: 1000 mL via INTRAVENOUS

## 2021-06-26 MED ORDER — ESCITALOPRAM OXALATE 10 MG PO TABS
10.0000 mg | ORAL_TABLET | Freq: Every day | ORAL | Status: DC
  Administered 2021-06-26 – 2021-06-27 (×2): 10 mg via ORAL
  Filled 2021-06-26 (×2): qty 1

## 2021-06-26 MED ORDER — ONDANSETRON 4 MG PO TBDP
4.0000 mg | ORAL_TABLET | Freq: Once | ORAL | Status: AC
  Administered 2021-06-26: 4 mg via ORAL
  Filled 2021-06-26: qty 1

## 2021-06-26 MED ORDER — CARIPRAZINE HCL 1.5 MG PO CAPS
3.0000 mg | ORAL_CAPSULE | Freq: Every day | ORAL | Status: DC
  Administered 2021-06-27: 3 mg via ORAL
  Filled 2021-06-26 (×2): qty 2
  Filled 2021-06-26 (×2): qty 1

## 2021-06-26 MED ORDER — HYDROXYZINE HCL 10 MG PO TABS
10.0000 mg | ORAL_TABLET | Freq: Three times a day (TID) | ORAL | Status: DC | PRN
  Administered 2021-06-27: 10 mg via ORAL
  Filled 2021-06-26 (×2): qty 1

## 2021-06-26 MED ORDER — DICLOFENAC SODIUM 1 % EX GEL
2.0000 g | Freq: Four times a day (QID) | CUTANEOUS | Status: DC
  Administered 2021-06-26 – 2021-06-27 (×3): 2 g via TOPICAL
  Filled 2021-06-26 (×2): qty 100

## 2021-06-26 MED ORDER — GABAPENTIN 100 MG PO CAPS
200.0000 mg | ORAL_CAPSULE | Freq: Three times a day (TID) | ORAL | Status: DC
Start: 2021-06-26 — End: 2021-06-27
  Administered 2021-06-26 – 2021-06-27 (×2): 200 mg via ORAL
  Filled 2021-06-26 (×2): qty 2

## 2021-06-26 NOTE — ED Notes (Signed)
Sitter at bedside.

## 2021-06-26 NOTE — ED Notes (Signed)
Poison Controlled called. Recommended repeat EKG and observation for 8 hours, repeat acetaminophen levels at 4 hours. Also recommended magnesium lab be added on. Continue to monitor BP and support BP through fluids. If BP drops low the recommendation is glucagon. If BP continues to drop, recommendations is to call and start high dose insulin at 1 unit/kg/hr. MD and PA made aware of recommendations.

## 2021-06-26 NOTE — ED Notes (Signed)
CBG 100 

## 2021-06-26 NOTE — ED Notes (Signed)
Patient is requesting her arthritis cream.

## 2021-06-26 NOTE — ED Notes (Signed)
Patient provided toiletries for bathing, clean scrubs, and directed to shower.

## 2021-06-26 NOTE — ED Provider Notes (Addendum)
[facility] Emergency Department Provider Note MRN:  [id number]  Arrival date & time: 06/26/21     Chief Complaint   Suicide Attempt   History of Present Illness   [name] is a 31 y.o. year-old female presents to the ED with chief complaint of overdose.  She states that she overdosed on  a handful of gabapentin, 6 propanolol, and 2 lexapro with intent to kill herself.  Multiple prior suicide attempts.  Goes to [facility] on [address] and was "kicked out ."  Patient states that she feel dizzy and nauseous. History provided by patient.   Review of Systems  Pertinent review of systems noted in HPI.    Physical Exam   Vitals:   06/26/21 0530 06/26/21 0545  BP: (!) 95/52 (!) 102/54  Pulse: 70 80  Resp: 16   Temp:    SpO2: 96% 91%    CONSTITUTIONAL:  well-appearing, NAD NEURO:  Alert and oriented x 3, CN 3-12 grossly intact EYES:  eyes equal and reactive ENT/NECK:  Supple, no stridor  CARDIO:  normal rate, regular rhythm, appears well-perfused  PULM:  No respiratory distress, CTAB GI/GU:  non-distended, non-tender MSK/SPINE:  No gross deformities, no edema, moves all extremities  SKIN:  no rash, atraumatic   *Additional and/or pertinent findings included in MDM below  Diagnostic and Interventional Summary    EKG Interpretation  Date/Time:  Monday Jun 26 2021 02:24:23 EDT Ventricular Rate:  70 PR Interval:  139 QRS Duration: 85 QT Interval:  404 QTC Calculation: 436 R Axis:   27 Text Interpretation: Sinus rhythm Low voltage, precordial leads Borderline T abnormalities, inferior leads Confirmed by Ripley Fraise [id number]) on 06/26/2021 2:30:24 AM       Labs Reviewed  COMPREHENSIVE METABOLIC PANEL - Abnormal; Notable for the following components:      Result Value   Glucose, Bld 111 (*)    All other components within normal limits  SALICYLATE LEVEL - Abnormal; Notable for the following components:   Salicylate Lvl <2.7 (*)    All  other components within normal limits  ACETAMINOPHEN LEVEL - Abnormal; Notable for the following components:   Acetaminophen (Tylenol), Serum <10 (*)    All other components within normal limits  ETHANOL  RAPID URINE DRUG SCREEN, HOSP PERFORMED  CBC WITH DIFFERENTIAL/PLATELET  MAGNESIUM  I-STAT BETA HCG BLOOD, ED ([facility], [facility], [facility] ONLY)  CBG MONITORING, ED    No orders to display    Medications  sodium chloride 0.9 % bolus 1,000 mL (has no administration in time range)  sodium chloride 0.9 % bolus 1,000 mL (has no administration in time range)  ondansetron (ZOFRAN-ODT) disintegrating tablet 4 mg (4 mg Oral Given 06/26/21 0538)     Procedures  /  Critical Care .Critical Care Performed by: Montine Circle, PA-C Authorized by: Montine Circle, PA-C   Critical care provider statement:    Critical care time (minutes):  50   Critical care was necessary to treat or prevent imminent or life-threatening deterioration of the following conditions:  Toxidrome   Critical care was time spent personally by me on the following activities:  Development of treatment plan with patient or surrogate, discussions with consultants, evaluation of patient's response to treatment, examination of patient, ordering and review of laboratory studies, ordering and review of radiographic studies, ordering and performing treatments and interventions, pulse oximetry, re-evaluation of patient's condition and review of old charts  ED Course and Medical Decision Making  I have reviewed the  triage vital signs, the nursing notes, and pertinent available records from the EMR.  Social Determinants Affecting Complexity of Care: Patient has no clinically significant social determinants affecting this chief complaint..   ED Course: Clinical Course as of 06/26/21 0625  Mon Jun 26, 2021  0258 RN spoke with Poison Control - Recommends 8hr obs, 4 hr Tylenol, magnesium level, fluids and glucagon if needed for hypotension. [RB]   0601 Poison Controlled called. Recommended repeat EKG and observation for 8 hours, repeat acetaminophen levels at 4 hours. Also recommended magnesium lab be added on. Continue to monitor BP and support BP through fluids. If BP drops low the recommendation is glucagon. If BP continues to drop, recommendations is to call and start high dose insulin at 1 unit/kg/hr. MD and PA made aware of recommendations. [RB]  [id number] Discussed with Dr. Christy Gentles due to BPs holding in the 90s to low 100s.  Patient is getting fluid boluses.  Dr. Christy Gentles recommends glucagon if BP drops below 90s consistently. [RB]    Clinical Course User Index [RB] Montine Circle, PA-C   Patient here with suicide attempt.   Medical Decision Making Problems Addressed: Suicide attempt [facility]): acute illness or injury that poses a threat to life or bodily functions  Amount and/or Complexity of Data Reviewed Labs: ordered.    Details: No significant lab abnormality  Risk Prescription drug management.     Consultants: No consultations were needed in caring for this patient.   Treatment and Plan: Patient signed out to oncoming team, who will continue care.    Final Clinical Impressions(s) / ED Diagnoses     ICD-10-CM   1. Suicide attempt [facility])  E03.52YE       ED Discharge Orders     None         Discharge Instructions Discussed with and Provided to Patient:   Discharge Instructions   None      Montine Circle, PA-C 06/26/21 0625    Montine Circle, PA-C 06/26/21 1859    Ripley Fraise, MD 06/26/21 [phone]

## 2021-06-26 NOTE — ED Notes (Signed)
RN asked patient if they would be willing to talk to pharmacy, patient states that she is tired and does not want to. Pharmacy states they will try again later.

## 2021-06-26 NOTE — ED Notes (Signed)
IVC paperwork completed. Faxed to [facility]. With [employer] .

## 2021-06-26 NOTE — ED Provider Notes (Signed)
Physical Exam  BP 94/71   Pulse 63   Temp 99 F (37.2 C) (Oral)   Resp 15   SpO2 96%   Physical Exam Vitals and nursing note reviewed.  Constitutional:      General: She is not in acute distress.    Appearance: Normal appearance. She is obese. She is not ill-appearing, toxic-appearing or diaphoretic.  HENT:     Head: Normocephalic.     Nose: Nose normal.     Mouth/Throat:     Mouth: Mucous membranes are moist.     Pharynx: Oropharynx is clear.  Eyes:     General:        Right eye: No discharge.        Left eye: No discharge.     Extraocular Movements: Extraocular movements intact.     Conjunctiva/sclera: Conjunctivae normal.  Cardiovascular:     Rate and Rhythm: Normal rate and regular rhythm.     Pulses: Normal pulses.     Heart sounds: Normal heart sounds.  Pulmonary:     Effort: Pulmonary effort is normal.     Breath sounds: Normal breath sounds.  Abdominal:     General: Abdomen is flat.     Palpations: Abdomen is soft.     Tenderness: There is no abdominal tenderness. There is no guarding.  Musculoskeletal:        General: Normal range of motion.     Cervical back: Normal range of motion.  Skin:    General: Skin is warm and dry.     Capillary Refill: Capillary refill takes less than 2 seconds.  Neurological:     General: No focal deficit present.     Mental Status: She is alert and oriented to person, place, and time.    Procedures  Procedures  ED Course / MDM   Clinical Course as of 06/26/21 0726  Mon Jun 26, 2021  0258 RN spoke with Poison Control - Recommends 8hr obs, 4 hr Tylenol, magnesium level, fluids and glucagon if needed for hypotension. [RB]  0601 Poison Controlled called. Recommended repeat EKG and observation for 8 hours, repeat acetaminophen levels at 4 hours. Also recommended magnesium lab be added on. Continue to monitor BP and support BP through fluids. If BP drops low the recommendation is glucagon. If BP continues to drop,  recommendations is to call and start high dose insulin at 1 unit/kg/hr. MD and PA made aware of recommendations. [RB]  [id number] Discussed with Dr. Christy Gentles due to BPs holding in the 90s to low 100s.  Patient is getting fluid boluses.  Dr. Christy Gentles recommends glucagon if BP drops below 90s consistently. [RB]  7628 BP(!): 79/30 Blood pressure was recorded while patient was lying on left arm cuff.  Blood pressure was retaken and it was around 94/71 [CR]    Clinical Course User Index [CR] Wilnette Kales, PA [RB] Montine Circle, PA-C   Medical Decision Making Amount and/or Complexity of Data Reviewed Labs: ordered.  Risk Prescription drug management.   This patient presents to the ED for concern of suicide attempt, this involves an extensive number of treatment options, and is a complaint that carries with it a high risk of complications and morbidity.  The differential diagnosis includes suicide attempt involving multiple different drugs.    Co morbidities that complicate the patient evaluation  Acetaminophen overdose 2019, Anxiety, depression, drug-seeking behavior, personality disorder, shizo-affective disorder, seizures.   Lab Tests:  I Ordered, and personally interpreted labs.  The pertinent results  include:  no acute abnormalities on laboratory studies obtained.    Imaging Studies ordered:  N/a   Cardiac Monitoring: / EKG:  The patient was maintained on a cardiac monitor.  I personally viewed and interpreted the cardiac monitored which showed an underlying rhythm of: sinus rhythm   Consultations Obtained:  I requested consultation with the poison control and TTS were consulted,  and discussed lab and imaging findings as well as pertinent plan - they recommend: observation with additional laboratory studies, and IVC with psych hold for further evaluation   Problem List / ED Course / Critical interventions / Medication management  Suicide attempt I ordered medication  including  Zofran for nausea 1L NS for hypotension  Reevaluation of the patient after these medicines showed that the patient improved I have reviewed the patients home medicines and have made adjustments as needed   Social Determinants of Health:  Removed from [facility] on [address] 11pm last night   Test / Admission - Considered:  Suicide attempt VS significant for persistent hypotension. Patient was given 2 L NS with appropriate BP response. Patient was continued being monitored for BP and HR trends for potential further treatment.  Patient was continued being observed pending TTS consult results. They recommended IVC with psych hold. Appropriate labs were ordered. Patient was stable upon hand off.         Wilnette Kales, [place] 06/26/21 Beverly Sessions    Davonna Belling, MD 06/29/21 [id number]

## 2021-06-26 NOTE — ED Notes (Signed)
RN informed by [facility] pt left AMA and was not kicked out.

## 2021-06-26 NOTE — ED Notes (Signed)
Patient has redness on her left foot in the shape of a band across the foot, resembling wearing a sandal too tight. Patient reports no pain or itching, just warmth. Patient currently elevating her foot. Will continue to monitor.

## 2021-06-26 NOTE — ED Notes (Signed)
Poison Control reports that they are signing off on the patient at this time.

## 2021-06-26 NOTE — ED Notes (Addendum)
Ronalee Belts from [facility] called and asked for an update on patient. He suggested another acetaminophen level being drawn and some basic lab work. He also suggested another EKG repeated prior to medically clearing. He suggested pressors for her BP if it got lower than 90 systolic. MD notified.

## 2021-06-26 NOTE — BH Assessment (Signed)
Per IVC paperwork:  "Patient overdosed on anti-depressants and cardiac medications with intent to kill herself."  Petitioned by Dr. Christy Gentles, [facility]

## 2021-06-26 NOTE — ED Notes (Signed)
Patient approached the desk asking to speak with the leader of "this place". RN asked what changed and patient reports that she is waiting patiently and this is happening because of what happened last night which was not her fault. Patient redirected to return to her room. Patient continues to complain to staff about how long she has been here without "seeing someone".

## 2021-06-26 NOTE — ED Notes (Signed)
This RN was receiving report and pt walked up to the nurse's station, pt was tapping fingers, sighing, and mumbling under breath. RN asked pt if she required anything, "I'm getting the fuck out of here whether or not you say I can." RN discussed IVC w/ pt and communicated boundaries and expectations. Pt walked back into room, talking loudly with self.

## 2021-06-26 NOTE — ED Notes (Signed)
Pt belongings in locker 4. Valuables were placed in envelope and given to security. Pt dressed in burgundy scrubs and wanded by security.

## 2021-06-26 NOTE — ED Triage Notes (Signed)
Pt bib [facility] from home for SI attempt with cardiac medication and anti-depressants. Pt took a handful of gabapentin, 6 propanolol, and 2 lexapro. Pt has extensive mental health hx. Pt last SI attempt was 4 weeks ago. She goes to [facility] on [address] and was kicked out last night at 11pm.   EMS vitals: 115/70, 78 HR, 98% O2

## 2021-06-27 DIAGNOSIS — T50902A Poisoning by unspecified drugs, medicaments and biological substances, intentional self-harm, initial encounter: Secondary | ICD-10-CM

## 2021-06-27 DIAGNOSIS — F259 Schizoaffective disorder, unspecified: Secondary | ICD-10-CM | POA: Diagnosis not present

## 2021-06-27 DIAGNOSIS — R45851 Suicidal ideations: Secondary | ICD-10-CM

## 2021-06-27 HISTORY — DX: Poisoning by unspecified drugs, medicaments and biological substances, intentional self-harm, initial encounter: T50.902A

## 2021-06-27 NOTE — ED Notes (Signed)
RN had talk w/ pt, RN expressed understanding for pt's frustration and discussed that we're here now and all that we can do is wait for TTS to be available. Pt requested some ibuprofen r/t toothache. Order obtained, pt much calmer now after discussion. Pt able to express understanding and verbalized agreement to stay w/out interventions
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
## 2021-06-27 NOTE — ED Notes (Signed)
Pt speaking w/ TTS

## 2021-06-27 NOTE — Consult Note (Signed)
Telepsych Consultation   Reason for Consult:  SI Referring Physician:  Teressa Lower, MD Location of Patient: [facility] Location of Provider: [facility]  Patient Identification: [name] MRN:  [id number] Principal Diagnosis: Suicidal ideations Diagnosis:  Principal Problem:   Suicidal ideations Active Problems:   Borderline personality disorder ([place])   Purposeful non-suicidal drug ingestion ([place])   Total Time spent with patient: 20 minutes  Subjective:  [name] is a 31 y.o. female patient admitted to the [facility] after an intentional overdose on prescription medication. Pt states, "Friday night I went to [facility] (the [facility]) and told them I was going to do something crazy. I was there and they asked me why I was still there and told me I was going to be leaving in the morning so I decided to leave at 11:00 at night and I got back to the house. I wasn't suicidal, I just acted on impulse. I felt frustrated with how they had kicked me out. I had fleeting suicidal thoughts and I acted on impulse."  HPI:  Patient seen via tele health by this provider; chart reviewed and consulted with Dr. Dwyane Dee on 06/27/21. On evaluation [name] is sitting on the side of the patient facing the camera with fair eye contact. She is alert and oriented x4. Her thought process is logical and speech is clear and coherent. Her mood is anxious and affect is congruent.  Patient states that she was not suicidal and did not attempt suicide. She states that on Friday she went to the behavioral health urgent care on [address] and told them she was going to do something. She states that she was trying to prove a point that when someone comes in for help you should not discharged them home. She states that she initially presented because she was having thoughts of wanting to cut. She states that she has been good and did not cut herself. She states that she counted the number of pills and took  5 propranolol pills, 3 Vraylar pills, 2 Lexapro pills, and 4 Neurontin pills in order to prove a point. She states that she is trying to speak up for mental illness. She repeatedly states that she is not suicidal. She states that she receives outpatient psychiatric services with [facility] community support team in [place]. She states that she was told that if she presents to the hospital 1 more time then she would be switched to the ACT team. She states that when she discharges she will call [facility] to schedule a follow-up appointment.   Patient currently denies suicidal ideations. Patient verbally contracts for safety to return home. She denies self-injurious behaviors.  She denies homicidal ideations.  She denies auditory or visual hallucinations.  There is no objective evidence that the patient is currently responding to internal or external stimuli she denies drinking alcohol or using illicit drugs.  Patient protective factors includes supportive father, lives with another person (ex boyfriend), no access to weapons, outpatient services with [name], and desire for treatment with ACT team.   Patient gives verbal consent for this provider to speak with her ex-boyfriend [name] [phone] to safety plan. [name] states that he did not witness the patient ingest any pills. He states that the patient told him that she wanted to go to the hospital so she can get long-term treatment.   Safety Plan [name] will reach out to her father, community support team with [facility], call 911 or call  mobile crisis, or go to nearest emergency room if condition worsens or if suicidal thoughts become active Patients' will follow up with [facility] for outpatient psychiatric services (therapy/medication management).  The suicide prevention education provided includes the following: Suicide risk factors Suicide prevention and interventions National Suicide Hotline telephone number [facility] assessment telephone number [facility] Emergency Assistance [name] and/or Residential Mobile Crisis Unit telephone number Request made of family/significant other to:  [facility] (e.g., guns, rifles, knives), all items previously/currently identified as safety concern.   Remove drugs/medications (over the counter, prescriptions, illicit drugs), all items previously/currently identified as a safety concern.    Past Psychiatric History: past psychiatric history significant for autism, anxiety, depression, and schizoaffective disorder (bipolar type), and borderline personality.   Risk to Self:  denies  Risk to Others:  denies Prior Inpatient Therapy:  yes  Prior Outpatient Therapy:  yes   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 fragile X syndrome    Chronic constipation    Depression    Drug-seeking behavior    Essential tremor    Headache    Overdose of acetaminophen 07/2017   and other meds   Personality disorder ([place])    Schizo-affective psychosis ([place])    Schizoaffective disorder, bipolar type ([place])    Seizures ([place])    Last seizure December 2017   Sleep apnea     Past Surgical History:  Procedure Laterality Date   MOUTH SURGERY  2009 or 2010   Family History:  Family History  Problem Relation Age of Onset   Mental illness Father    Asthma Father    PDD Brother    Seizures Brother    Family Psychiatric  History: No history reported.  Social History:  Social History   Substance and Sexual Activity  Alcohol Use No   Alcohol/week: 1.0 standard drink   Types: 1 Standard drinks or equivalent per week   Comment: denies at this time     Social History   Substance and Sexual Activity  Drug Use No   Comment: History of cocaine use at age 51 for 4 months    Social History   Socioeconomic History   Marital  status: Widowed    Spouse name: Not on file   Number of children: 0   Years of education: Not on file   Highest education level: Not on file  Occupational History   Occupation: disability  Tobacco Use   Smoking status: Former    Packs/day: 0.00    Types: Cigarettes   Smokeless tobacco: Never   Tobacco comments:    Smoked for 2  years age [age]  Vaping Use   Vaping Use: Never used  Substance and Sexual Activity   Alcohol use: No    Alcohol/week: 1.0 standard drink    Types: 1 Standard drinks or equivalent per week    Comment: denies at this time   Drug use: No    Comment: History of cocaine use at age 59 for 4 months   Sexual activity: Not Currently    Birth control/protection: None  Other Topics Concern   Not on file  Social History Narrative   Marital status: Widowed      Children: daughter      Lives: with boyfriend, in two  story home      Employment:  Disability      Tobacco: quit smoking; smoked for two years.      Alcohol ;none      Drugs: none   Has not traveled outside of the country.   Right handed         Social Determinants of Health   Financial Resource Strain: Not on file  Food Insecurity: Not on file  Transportation Needs: Not on file  Physical Activity: Not on file  Stress: Not on file  Social Connections: Not on file   Additional Social History:    Allergies:   Allergies  Allergen Reactions   Bee Venom Anaphylaxis   Coconut Flavor Anaphylaxis and Rash   Geodon [Ziprasidone Hcl] Other (See Comments)    Pt states that this medication causes paralysis of the mouth.     Haloperidol And Related Other (See Comments)    Pt states that this medication causes paralysis of the mouth, jaw locks up   Lithium Other (See Comments)    Seizure-like activity    Oxycodone Other (See Comments)    Hallucinations    Quetiapine Other (See Comments)    Pt states that this medication is too strong.    Shellfish Allergy Anaphylaxis   Phenergan [Promethazine  Hcl] Other (See Comments)    Chest pain     Prilosec [Omeprazole] Nausea And Vomiting   Sulfa Antibiotics Other (See Comments)    Chest pain    Tegretol [Carbamazepine] Nausea And Vomiting   Prozac [Fluoxetine] Other (See Comments)    Increased Depression , Suicidal thoughts   Tape Other (See Comments)    Skin tears, can only tolerate paper tape.   Tylenol [Acetaminophen]     Unspecified reaction    Labs:  Results for orders placed or performed during the hospital encounter of 06/26/21 (from the past 48 hour(s))  Comprehensive metabolic panel     Status: Abnormal   Collection Time: 06/26/21  2:26 AM  Result Value Ref Range   Sodium 138 135 - 145 mmol/L   Potassium 4.1 3.5 - 5.1 mmol/L   Chloride 109 98 - 111 mmol/L   CO2 22 22 - 32 mmol/L   Glucose, Bld 111 (H) 70 - 99 mg/dL    Comment: Glucose reference range applies only to samples taken after fasting for at least 8 hours.   BUN 6 6 - 20 mg/dL   Creatinine, Ser 0.74 0.44 - 1.00 mg/dL   Calcium 9.6 8.9 - 10.3 mg/dL   Total Protein 7.1 6.5 - 8.1 g/dL   Albumin 4.1 3.5 - 5.0 g/dL   AST 21 15 - 41 U/L   ALT 20 0 - 44 U/L   Alkaline Phosphatase 60 38 - 126 U/L   Total Bilirubin 0.6 0.3 - 1.2 mg/dL   GFR, Estimated >60 >60 mL/min    Comment: (NOTE) Calculated using the CKD-EPI Creatinine Equation (2021)    Anion gap 7 5 - 15    Comment: Performed at [facility] [address] [place], [place] [address]  Salicylate level     Status: Abnormal   Collection Time: 06/26/21  2:26 AM  Result Value Ref Range   Salicylate Lvl <6.3 (L) 7.0 - 30.0 mg/dL    Comment: Performed at [facility] [address], [place], [place] [address]  Acetaminophen level     Status: Abnormal   Collection Time: 06/26/21  2:26 AM  Result Value Ref Range  Acetaminophen (Tylenol), Serum <10 (L) 10 - 30 ug/mL    Comment: (NOTE) Therapeutic concentrations vary significantly. A range of 10-30 ug/mL  may be an effective concentration for  many patients. However, some  are best treated at concentrations outside of this range. Acetaminophen concentrations >150 ug/mL at 4 hours after ingestion  and >50 ug/mL at 12 hours after ingestion are often associated with  toxic reactions.  Performed at [facility], [place] [address], [place], [place] [address]   Ethanol     Status: None   Collection Time: 06/26/21  2:26 AM  Result Value Ref Range   Alcohol, Ethyl (B) <10 <10 mg/dL    Comment: (NOTE) Lowest detectable limit for serum alcohol is 10 mg/dL.  For medical purposes only. Performed at [facility], [place] [address] [place], [place] [address]   CBC WITH DIFFERENTIAL     Status: None   Collection Time: 06/26/21  2:26 AM  Result Value Ref Range   WBC 9.4 4.0 - 10.5 K/uL   RBC 4.89 3.87 - 5.11 MIL/uL   Hemoglobin 12.9 12.0 - 15.0 g/dL   HCT 40.1 36.0 - 46.0 %   MCV 82.0 80.0 - 100.0 fL   MCH 26.4 26.0 - 34.0 pg   MCHC 32.2 30.0 - 36.0 g/dL   RDW 15.5 11.5 - 15.5 %   Platelets 298 150 - 400 K/uL   nRBC 0.0 0.0 - 0.2 %   Neutrophils Relative % 62 %   Neutro Abs 5.8 1.7 - 7.7 K/uL   Lymphocytes Relative 32 %   Lymphs Abs 3.0 0.7 - 4.0 K/uL   Monocytes Relative 5 %   Monocytes Absolute 0.4 0.1 - 1.0 K/uL   Eosinophils Relative 1 %   Eosinophils Absolute 0.1 0.0 - 0.5 K/uL   Basophils Relative 0 %   Basophils Absolute 0.0 0.0 - 0.1 K/uL   Immature Granulocytes 0 %   Abs Immature Granulocytes 0.03 0.00 - 0.07 K/uL    Comment: Performed at [facility], [address], [place], [place] [address]  Magnesium     Status: None   Collection Time: 06/26/21  2:26 AM  Result Value Ref Range   Magnesium 1.8 1.7 - 2.4 mg/dL    Comment: Performed at [facility] [address], [place], [place] [address]  I-Stat beta hCG blood, ED     Status: None   Collection Time: 06/26/21  2:40 AM  Result Value Ref Range   I-stat hCG, quantitative <5.0 <5 mIU/mL   Comment 3            Comment:   GEST. AGE       CONC.  (mIU/mL)   <=1 WEEK        5 - 50     2 WEEKS       50 - 500     3 WEEKS       100 - 10,000     4 WEEKS     1,000 - 30,000        FEMALE AND NON-PREGNANT FEMALE:     LESS THAN 5 mIU/mL   Urine rapid drug screen (hosp performed)     Status: None   Collection Time: 06/26/21  2:42 AM  Result Value Ref Range   Opiates NONE DETECTED NONE DETECTED   Cocaine NONE DETECTED NONE DETECTED   Benzodiazepines NONE DETECTED NONE DETECTED   Amphetamines NONE DETECTED NONE DETECTED  Tetrahydrocannabinol NONE DETECTED NONE DETECTED   Barbiturates NONE DETECTED NONE DETECTED    Comment: (NOTE) DRUG SCREEN FOR MEDICAL PURPOSES ONLY.  IF CONFIRMATION IS NEEDED FOR ANY PURPOSE, NOTIFY LAB WITHIN 5 DAYS.  LOWEST DETECTABLE LIMITS FOR URINE DRUG SCREEN Drug Class                     Cutoff (ng/mL) Amphetamine and metabolites    1000 Barbiturate and metabolites    200 Benzodiazepine                 580 Tricyclics and metabolites     300 Opiates and metabolites        300 Cocaine and metabolites        300 THC                            50 Performed at [facility], [place] [address] [place], [place] [address]   CBG monitoring, ED     Status: Abnormal   Collection Time: 06/26/21  8:17 AM  Result Value Ref Range   Glucose-Capillary 100 (H) 70 - 99 mg/dL    Comment: Glucose reference range applies only to samples taken after fasting for at least 8 hours.  Acetaminophen level     Status: Abnormal   Collection Time: 06/26/21  8:47 AM  Result Value Ref Range   Acetaminophen (Tylenol), Serum <10 (L) 10 - 30 ug/mL    Comment: (NOTE) Therapeutic concentrations vary significantly. A range of 10-30 ug/mL  may be an effective concentration for many patients. However, some  are best treated at concentrations outside of this range. Acetaminophen concentrations >150 ug/mL at 4 hours after ingestion  and >50 ug/mL at 12 hours after ingestion are often associated with  toxic  reactions.  Performed at [facility], [place] [address] [place], [place] [address]   CBC WITH DIFFERENTIAL     Status: Abnormal   Collection Time: 06/26/21  8:47 AM  Result Value Ref Range   WBC 6.3 4.0 - 10.5 K/uL   RBC 3.92 3.87 - 5.11 MIL/uL   Hemoglobin 10.3 (L) 12.0 - 15.0 g/dL   HCT 32.4 (L) 36.0 - 46.0 %   MCV 82.7 80.0 - 100.0 fL   MCH 26.3 26.0 - 34.0 pg   MCHC 31.8 30.0 - 36.0 g/dL   RDW 15.5 11.5 - 15.5 %   Platelets 201 150 - 400 K/uL   nRBC 0.0 0.0 - 0.2 %   Neutrophils Relative % 56 %   Neutro Abs 3.5 1.7 - 7.7 K/uL   Lymphocytes Relative 38 %   Lymphs Abs 2.4 0.7 - 4.0 K/uL   Monocytes Relative 5 %   Monocytes Absolute 0.3 0.1 - 1.0 K/uL   Eosinophils Relative 1 %   Eosinophils Absolute 0.1 0.0 - 0.5 K/uL   Basophils Relative 0 %   Basophils Absolute 0.0 0.0 - 0.1 K/uL   Immature Granulocytes 0 %   Abs Immature Granulocytes 0.01 0.00 - 0.07 K/uL    Comment: Performed at [facility], [address], [place], [place] [address]  Comprehensive metabolic panel     Status: Abnormal   Collection Time: 06/26/21  8:47 AM  Result Value Ref Range   Sodium 138 135 - 145 mmol/L   Potassium 3.7 3.5 - 5.1 mmol/L   Chloride 114 (H) 98 - 111 mmol/L   CO2 22 22 -  32 mmol/L   Glucose, Bld 99 70 - 99 mg/dL    Comment: Glucose reference range applies only to samples taken after fasting for at least 8 hours.   BUN 5 (L) 6 - 20 mg/dL   Creatinine, Ser 0.62 0.44 - 1.00 mg/dL   Calcium 8.0 (L) 8.9 - 10.3 mg/dL   Total Protein 5.6 (L) 6.5 - 8.1 g/dL   Albumin 3.1 (L) 3.5 - 5.0 g/dL   AST 15 15 - 41 U/L   ALT 15 0 - 44 U/L   Alkaline Phosphatase 46 38 - 126 U/L   Total Bilirubin 0.5 0.3 - 1.2 mg/dL   GFR, Estimated >60 >60 mL/min    Comment: (NOTE) Calculated using the CKD-EPI Creatinine Equation (2021)    Anion gap 2 (L) 5 - 15    Comment: Electrolytes repeated to confirm. Performed at [facility], [place] [address], [place], [place] [address]      Medications:  Current Facility-Administered Medications  Medication Dose Route Frequency Provider Last Rate Last Admin   cariprazine (VRAYLAR) capsule 3 mg  3 mg Oral Daily Kommor, Madison, MD   3 mg at 06/27/21 0009   diclofenac Sodium (VOLTAREN) 1 % topical gel 2 g  2 g Topical QID Kommor, Madison, MD   2 g at 06/27/21 0955   escitalopram (LEXAPRO) tablet 10 mg  10 mg Oral Daily Kommor, Madison, MD   10 mg at 06/27/21 2355   gabapentin (NEURONTIN) capsule 200 mg  200 mg Oral TID Kommor, Madison, MD   200 mg at 06/27/21 7322   hydrOXYzine (ATARAX) tablet 10 mg  10 mg Oral TID PRN Kommor, Madison, MD   10 mg at 06/27/21 0009   ibuprofen (ADVIL) tablet 600 mg  600 mg Oral Q6H PRN Kommor, Madison, MD   600 mg at 06/26/21 2352   LORazepam (ATIVAN) injection 1 mg  1 mg Intramuscular Once PRN Kommor, Madison, MD       LORazepam (ATIVAN) tablet 2 mg  2 mg Oral Once Kommor, Madison, MD       melatonin tablet 3 mg  3 mg Oral QHS PRN Kommor, Madison, MD   3 mg at 06/27/21 0010   mometasone-formoterol (DULERA) 200-5 MCG/ACT inhaler 2 puff  2 puff Inhalation BID Kommor, Madison, MD   2 puff at 06/27/21 0800   pantoprazole (PROTONIX) EC tablet 40 mg  40 mg Oral Daily Kommor, Madison, MD   40 mg at 06/27/21 0254   Current Outpatient Medications  Medication Sig Dispense Refill   albuterol (VENTOLIN HFA) 108 (90 Base) MCG/ACT inhaler Inhale 2 puffs into the lungs every 6 (six) hours as needed for wheezing or shortness of breath.     benzocaine (ORAJEL) 10 % mucosal gel Use as directed 1 application. in the mouth or throat daily as needed for mouth pain.     budesonide-formoterol (SYMBICORT) 160-4.5 MCG/ACT inhaler Inhale 2 puffs into the lungs 2 (two) times daily.     calcium carbonate (TUMS - DOSED IN MG ELEMENTAL CALCIUM) 500 MG chewable tablet Chew 1 tablet by mouth daily as needed for indigestion or heartburn.     cariprazine (VRAYLAR) 3 MG capsule Take 3 mg by mouth daily.     diclofenac Sodium  (VOLTAREN) 1 % GEL Apply 2 g topically 4 (four) times daily. 100 g 0   escitalopram (LEXAPRO) 10 MG tablet Take 10 mg by mouth daily.     gabapentin (NEURONTIN) 100 MG capsule Take 200 mg  by mouth 3 (three) times daily.     hydrOXYzine (ATARAX) 10 MG tablet Take 10 mg by mouth 3 (three) times daily as needed for anxiety.     ibuprofen (ADVIL) 200 MG tablet Take 400-600 mg by mouth every 6 (six) hours as needed for moderate pain or headache.     lidocaine (LIDODERM) 5 % Place 1 patch onto the skin daily as needed. Remove & Discard patch within 12 hours or as directed by MD (Patient taking differently: Place 1 patch onto the skin daily as needed (pain). Remove & Discard patch within 12 hours or as directed by MD) 15 patch 0   melatonin 3 MG TABS tablet Take 3 mg by mouth at bedtime.     ondansetron (ZOFRAN) 4 MG tablet Take 4 mg by mouth every 8 (eight) hours as needed for nausea or vomiting.     pantoprazole (PROTONIX) 40 MG tablet TAKE 1 TABLET BY MOUTH DAILY (Patient taking differently: 40 mg daily.) 30 tablet 0   propranolol (INDERAL) 10 MG tablet Take 10 mg by mouth 2 (two) times daily.     acetaminophen (TYLENOL) 325 MG tablet Take 2 tablets (650 mg total) by mouth every 6 (six) hours as needed. (Patient not taking: Reported on 06/04/2021) 36 tablet 0   escitalopram (LEXAPRO) 10 MG tablet Take 1 tablet (10 mg total) by mouth daily. 30 tablet 0   gabapentin (NEURONTIN) 100 MG capsule Take 2 capsules (200 mg total) by mouth 3 (three) times daily. 180 capsule 0   propranolol (INDERAL) 10 MG tablet Take 1 tablet (10 mg total) by mouth 2 (two) times daily. 60 tablet 0    Psychiatric Specialty Exam:  Presentation  General Appearance: Appropriate for Environment  Eye Contact:Fair  Speech:Clear and Coherent  Speech Volume:Normal  Handedness:Right   Mood and Affect  Mood:Anxious  Affect:Congruent   Thought Process  Thought Processes:Coherent; Goal Directed  Descriptions of  Associations:Intact  Orientation:Full (Time, Place and Person)  Thought Content:WDL  History of Schizophrenia/Schizoaffective disorder:Yes  Duration of Psychotic Symptoms:Greater than six months  Hallucinations:Hallucinations: None  Ideas of Reference:None  Suicidal Thoughts:Suicidal Thoughts: No  Homicidal Thoughts:No data recorded  Sensorium  Memory:Immediate Fair; Recent Fair; Remote Fair  Judgment:Intact  Insight:Shallow   Executive Functions  Concentration:Fair  Attention Span:Fair  [place]  Language:Good   Psychomotor Activity  Psychomotor Activity:Psychomotor Activity: Normal   Assets  Assets:Communication Skills; Desire for Improvement; Housing; Intimacy; Physical Health; Social Support; Leisure Time   Sleep  Sleep:Sleep: Fair Number of Hours of Sleep: 6    Physical Exam: Physical Exam Cardiovascular:     Rate and Rhythm: Normal rate.  Pulmonary:     Effort: Pulmonary effort is normal.  Neurological:     Mental Status: She is alert and oriented to person, place, and time.   Review of Systems  Constitutional: Negative.   HENT: Negative.    Eyes: Negative.   Respiratory: Negative.    Cardiovascular: Negative.   Gastrointestinal: Negative.   Genitourinary: Negative.   Musculoskeletal: Negative.   Skin: Negative.   Neurological: Negative.   Endo/Heme/Allergies: Negative.   Blood pressure 102/62, pulse 85, temperature 97.7 F (36.5 C), temperature source Oral, resp. rate 20, SpO2 100 %. There is no height or weight on file to calculate BMI.  Treatment Plan Summary: Patient is psychiatrically cleared for discharge.  Follow up with [facility] in [facility] for therapy, support and medication management.    Continue current medication regimen as prescribed  by [name].   cariprazine  3 mg Oral Daily   diclofenac Sodium  2 g Topical QID   escitalopram  10 mg Oral Daily   gabapentin  200 mg Oral TID    LORazepam  2 mg Oral Once   mometasone-formoterol  2 puff Inhalation BID   pantoprazole  40 mg Oral Daily    Disposition: No evidence of imminent risk to self or others at present.   Patient does not meet criteria for psychiatric inpatient admission. Supportive therapy provided about ongoing stressors. Discussed crisis plan, support from social network, calling 911, coming to the Emergency Department, and calling Suicide Hotline.  This service was provided via telemedicine using a 2-way, interactive audio and video technology.  Names of all persons participating in this telemedicine service and their role in this encounter. Name: [name]  Role: Patient   Name: [name]  Role: NP  Name:  Role:   Name:  Role:    A secure chat sent to Dr. Vanita Panda and Blima Singer, [place] with the stated disposition and plan of care.   Evelyn Calamity, NP 06/27/2021 12:56 PM

## 2021-06-27 NOTE — ED Notes (Signed)
Patient given discharge instructions, all questions answered. Patient in possession of all belongings, directed to the discharge area  

## 2021-06-27 NOTE — ED Notes (Signed)
Communicated w/ pharmacy about pt medications. Being tubed at this time.

## 2021-06-27 NOTE — ED Notes (Signed)
Lunch order placed

## 2021-06-27 NOTE — ED Notes (Signed)
Sitter has left, spoke with staffing and they don't have anyone planned to replace her at this time.

## 2021-06-27 NOTE — ED Notes (Signed)
Patient on TTS 

## 2021-06-27 NOTE — ED Notes (Signed)
IVC rescinded 

## 2021-06-27 NOTE — ED Notes (Signed)
Patient refusd vitalsigns this morning

## 2021-06-27 NOTE — Discharge Instructions (Signed)
Discharge recommendations:  Patient is to take medications as prescribed. Please see information for follow-up appointment with psychiatry and therapy. Please follow up with your primary care provider for all medical related needs.    Therapy: We recommend that patient participate in individual therapy to address mental health concerns.  Medications: The parent/guardian is to contact a medical professional and/or outpatient provider to address any new side effects that develop. Parent/guardian should update outpatient providers of any new medications and/or medication changes.   Safety:  The patient should abstain from use of illicit substances/drugs and abuse of any medications. If symptoms worsen or do not continue to improve or if the patient becomes actively suicidal or homicidal then it is recommended that the patient return to the closest hospital emergency department, the [name], or call 911 for further evaluation and treatment. National Suicide Prevention Lifeline 1-800-SUICIDE or 1-800-273-8255.  About 988 988 offers 24/7 access to trained crisis counselors who can help people experiencing mental health-related distress. People can call or text 988 or chat 988lifeline.org for themselves or if they are worried about a loved one who may need crisis support.      

## 2021-06-27 NOTE — ED Notes (Signed)
Breakfast order placed ?

## 2021-06-27 NOTE — ED Provider Notes (Addendum)
Emergency Medicine Observation Re-evaluation Note  [name] is a 31 y.o. female, seen on rounds today.  Pt initially presented to the ED for complaints of Suicide Attempt Currently, the patient is resting in no distress.  Physical Exam  BP (!) 101/55 (BP Location: Left Arm)   Pulse 72   Temp 98.1 F (36.7 C) (Oral)   Resp 15   SpO2 100%  Physical Exam General: No distress Cardiac: Regular rate and rhythm Lungs: No increased work of breathing Psych: Calm  ED Course / MDM  EKG:EKG Interpretation  Date/Time:  Monday Jun 26 2021 08:46:22 EDT Ventricular Rate:  70 PR Interval:  138 QRS Duration: 91 QT Interval:  436 QTC Calculation: 471 R Axis:   28 Text Interpretation: Sinus rhythm Low voltage, precordial leads Confirmed by Davonna Belling [phone]) on 06/26/2021 8:52:09 AM  I have reviewed the labs performed to date as well as medications administered while in observation.  Recent changes in the last 24 hours include no notable events.  Plan  Current plan is for repeat evaluation from behavioral health. [name] is under involuntary commitment.      Carmin Muskrat, MD 06/27/21 [phone]   2:28 PM Patient has been cleared by our behavioral health team, he is ready to go to work, discharged.   Carmin Muskrat, MD 06/27/21 [phone]

## 2021-07-01 ENCOUNTER — Other Ambulatory Visit: Payer: Self-pay

## 2021-07-01 ENCOUNTER — Inpatient Hospital Stay (HOSPITAL_COMMUNITY)
Admission: EM | Admit: 2021-07-01 | Discharge: 2021-07-04 | DRG: 918 | Disposition: A | Discharge status: Other Acute Inpatient Hospital | Payer: PPO | Attending: Internal Medicine | Admitting: Internal Medicine

## 2021-07-01 ENCOUNTER — Encounter (HOSPITAL_COMMUNITY): Payer: Self-pay

## 2021-07-01 ENCOUNTER — Inpatient Hospital Stay: Payer: Self-pay

## 2021-07-01 DIAGNOSIS — F603 Borderline personality disorder: Secondary | ICD-10-CM | POA: Diagnosis present

## 2021-07-01 DIAGNOSIS — X838XXA Intentional self-harm by other specified means, initial encounter: Secondary | ICD-10-CM

## 2021-07-01 DIAGNOSIS — F259 Schizoaffective disorder, unspecified: Secondary | ICD-10-CM | POA: Diagnosis not present

## 2021-07-01 DIAGNOSIS — Z79899 Other long term (current) drug therapy: Secondary | ICD-10-CM | POA: Diagnosis not present

## 2021-07-01 DIAGNOSIS — K219 Gastro-esophageal reflux disease without esophagitis: Secondary | ICD-10-CM | POA: Diagnosis present

## 2021-07-01 DIAGNOSIS — G4730 Sleep apnea, unspecified: Secondary | ICD-10-CM | POA: Diagnosis not present

## 2021-07-01 DIAGNOSIS — Z91013 Allergy to seafood: Secondary | ICD-10-CM | POA: Diagnosis not present

## 2021-07-01 DIAGNOSIS — F32A Depression, unspecified: Secondary | ICD-10-CM | POA: Diagnosis present

## 2021-07-01 DIAGNOSIS — F840 Autistic disorder: Secondary | ICD-10-CM | POA: Diagnosis not present

## 2021-07-01 DIAGNOSIS — Z148 Genetic carrier of other disease: Secondary | ICD-10-CM

## 2021-07-01 DIAGNOSIS — Z888 Allergy status to other drugs, medicaments and biological substances status: Secondary | ICD-10-CM

## 2021-07-01 DIAGNOSIS — J45909 Unspecified asthma, uncomplicated: Secondary | ICD-10-CM | POA: Diagnosis not present

## 2021-07-01 DIAGNOSIS — G40909 Epilepsy, unspecified, not intractable, without status epilepticus: Secondary | ICD-10-CM | POA: Diagnosis not present

## 2021-07-01 DIAGNOSIS — Z885 Allergy status to narcotic agent status: Secondary | ICD-10-CM | POA: Diagnosis not present

## 2021-07-01 DIAGNOSIS — Z91048 Other nonmedicinal substance allergy status: Secondary | ICD-10-CM

## 2021-07-01 DIAGNOSIS — Z9102 Food additives allergy status: Secondary | ICD-10-CM | POA: Diagnosis not present

## 2021-07-01 DIAGNOSIS — I952 Hypotension due to drugs: Secondary | ICD-10-CM | POA: Diagnosis not present

## 2021-07-01 DIAGNOSIS — Z7951 Long term (current) use of inhaled steroids: Secondary | ICD-10-CM | POA: Diagnosis not present

## 2021-07-01 DIAGNOSIS — T887XXA Unspecified adverse effect of drug or medicament, initial encounter: Secondary | ICD-10-CM | POA: Diagnosis not present

## 2021-07-01 DIAGNOSIS — Z20822 Contact with and (suspected) exposure to covid-19: Secondary | ICD-10-CM | POA: Diagnosis not present

## 2021-07-01 DIAGNOSIS — T50904A Poisoning by unspecified drugs, medicaments and biological substances, undetermined, initial encounter: Secondary | ICD-10-CM | POA: Diagnosis not present

## 2021-07-01 DIAGNOSIS — Z9151 Personal history of suicidal behavior: Secondary | ICD-10-CM

## 2021-07-01 DIAGNOSIS — R42 Dizziness and giddiness: Secondary | ICD-10-CM | POA: Diagnosis not present

## 2021-07-01 DIAGNOSIS — F315 Bipolar disorder, current episode depressed, severe, with psychotic features: Secondary | ICD-10-CM | POA: Diagnosis not present

## 2021-07-01 DIAGNOSIS — Z818 Family history of other mental and behavioral disorders: Secondary | ICD-10-CM | POA: Diagnosis not present

## 2021-07-01 DIAGNOSIS — Z91030 Bee allergy status: Secondary | ICD-10-CM

## 2021-07-01 DIAGNOSIS — F4310 Post-traumatic stress disorder, unspecified: Secondary | ICD-10-CM | POA: Diagnosis present

## 2021-07-01 DIAGNOSIS — Z882 Allergy status to sulfonamides status: Secondary | ICD-10-CM

## 2021-07-01 DIAGNOSIS — Z87891 Personal history of nicotine dependence: Secondary | ICD-10-CM

## 2021-07-01 DIAGNOSIS — T1491XA Suicide attempt, initial encounter: Principal | ICD-10-CM

## 2021-07-01 DIAGNOSIS — R001 Bradycardia, unspecified: Secondary | ICD-10-CM

## 2021-07-01 DIAGNOSIS — T447X2A Poisoning by beta-adrenoreceptor antagonists, intentional self-harm, initial encounter: Principal | ICD-10-CM | POA: Diagnosis present

## 2021-07-01 DIAGNOSIS — I959 Hypotension, unspecified: Secondary | ICD-10-CM | POA: Diagnosis not present

## 2021-07-01 DIAGNOSIS — K5909 Other constipation: Secondary | ICD-10-CM | POA: Diagnosis not present

## 2021-07-01 DIAGNOSIS — F29 Unspecified psychosis not due to a substance or known physiological condition: Secondary | ICD-10-CM | POA: Diagnosis not present

## 2021-07-01 HISTORY — DX: Intentional self-harm by other specified means, initial encounter: X83.8XXA

## 2021-07-01 LAB — GLUCOSE, CAPILLARY
Glucose-Capillary: 106 mg/dL — ABNORMAL HIGH (ref 70–99)
Glucose-Capillary: 109 mg/dL — ABNORMAL HIGH (ref 70–99)
Glucose-Capillary: 124 mg/dL — ABNORMAL HIGH (ref 70–99)
Glucose-Capillary: 125 mg/dL — ABNORMAL HIGH (ref 70–99)
Glucose-Capillary: 133 mg/dL — ABNORMAL HIGH (ref 70–99)
Glucose-Capillary: 152 mg/dL — ABNORMAL HIGH (ref 70–99)

## 2021-07-01 LAB — CBC WITH DIFFERENTIAL/PLATELET
Abs Immature Granulocytes: 0.03 10*3/uL (ref 0.00–0.07)
Basophils Absolute: 0 10*3/uL (ref 0.0–0.1)
Basophils Relative: 0 %
Eosinophils Absolute: 0 10*3/uL (ref 0.0–0.5)
Eosinophils Relative: 1 %
HCT: 37.1 % (ref 36.0–46.0)
Hemoglobin: 11.2 g/dL — ABNORMAL LOW (ref 12.0–15.0)
Immature Granulocytes: 0 %
Lymphocytes Relative: 28 %
Lymphs Abs: 1.9 10*3/uL (ref 0.7–4.0)
MCH: 25.8 pg — ABNORMAL LOW (ref 26.0–34.0)
MCHC: 30.2 g/dL (ref 30.0–36.0)
MCV: 85.5 fL (ref 80.0–100.0)
Monocytes Absolute: 0.3 10*3/uL (ref 0.1–1.0)
Monocytes Relative: 5 %
Neutro Abs: 4.6 10*3/uL (ref 1.7–7.7)
Neutrophils Relative %: 66 %
Platelets: 259 10*3/uL (ref 150–400)
RBC: 4.34 MIL/uL (ref 3.87–5.11)
RDW: 15.1 % (ref 11.5–15.5)
WBC: 6.9 10*3/uL (ref 4.0–10.5)
nRBC: 0 % (ref 0.0–0.2)

## 2021-07-01 LAB — CBG MONITORING, ED
Glucose-Capillary: 187 mg/dL — ABNORMAL HIGH (ref 70–99)
Glucose-Capillary: 494 mg/dL — ABNORMAL HIGH (ref 70–99)

## 2021-07-01 LAB — ACETAMINOPHEN LEVEL
Acetaminophen (Tylenol), Serum: <10 ug/mL — ABNORMAL LOW (ref 10–30)
Acetaminophen (Tylenol), Serum: <10 ug/mL — ABNORMAL LOW (ref 10–30)

## 2021-07-01 LAB — BASIC METABOLIC PANEL
Anion gap: 5 (ref 5–15)
BUN: 7 mg/dL (ref 6–20)
CO2: 21 mmol/L — ABNORMAL LOW (ref 22–32)
Calcium: 8.5 mg/dL — ABNORMAL LOW (ref 8.9–10.3)
Chloride: 112 mmol/L — ABNORMAL HIGH (ref 98–111)
Creatinine, Ser: 0.7 mg/dL (ref 0.44–1.00)
GFR, Estimated: >60 mL/min (ref >60)
Glucose, Bld: 91 mg/dL (ref 70–99)
Potassium: 3.1 mmol/L — ABNORMAL LOW (ref 3.5–5.1)
Sodium: 138 mmol/L (ref 135–145)

## 2021-07-01 LAB — RAPID URINE DRUG SCREEN, HOSP PERFORMED
Amphetamines: NOT DETECTED
Barbiturates: NOT DETECTED
Benzodiazepines: NOT DETECTED
Cocaine: NOT DETECTED
Opiates: NOT DETECTED
Tetrahydrocannabinol: NOT DETECTED

## 2021-07-01 LAB — COMPREHENSIVE METABOLIC PANEL
ALT: 15 U/L (ref 0–44)
AST: 16 U/L (ref 15–41)
Albumin: 3.6 g/dL (ref 3.5–5.0)
Alkaline Phosphatase: 55 U/L (ref 38–126)
Anion gap: 8 (ref 5–15)
BUN: 8 mg/dL (ref 6–20)
CO2: 22 mmol/L (ref 22–32)
Calcium: 8.8 mg/dL — ABNORMAL LOW (ref 8.9–10.3)
Chloride: 111 mmol/L (ref 98–111)
Creatinine, Ser: 0.77 mg/dL (ref 0.44–1.00)
GFR, Estimated: >60 mL/min (ref >60)
Glucose, Bld: 105 mg/dL — ABNORMAL HIGH (ref 70–99)
Potassium: 4.1 mmol/L (ref 3.5–5.1)
Sodium: 141 mmol/L (ref 135–145)
Total Bilirubin: 0.6 mg/dL (ref 0.3–1.2)
Total Protein: 6.3 g/dL — ABNORMAL LOW (ref 6.5–8.1)

## 2021-07-01 LAB — ETHANOL: Alcohol, Ethyl (B): <10 mg/dL (ref <10)

## 2021-07-01 LAB — I-STAT BETA HCG BLOOD, ED (MC, WL, AP ONLY): I-stat hCG, quantitative: <5 m[IU]/mL (ref <5)

## 2021-07-01 LAB — MAGNESIUM: Magnesium: 1.9 mg/dL (ref 1.7–2.4)

## 2021-07-01 LAB — HIV ANTIBODY (ROUTINE TESTING W REFLEX): HIV Screen 4th Generation wRfx: NONREACTIVE

## 2021-07-01 LAB — MRSA NEXT GEN BY PCR, NASAL: MRSA by PCR Next Gen: NOT DETECTED

## 2021-07-01 LAB — SALICYLATE LEVEL: Salicylate Lvl: <7 mg/dL — ABNORMAL LOW (ref 7.0–30.0)

## 2021-07-01 MED ORDER — CHLORHEXIDINE GLUCONATE CLOTH 2 % EX PADS
6.0000 | MEDICATED_PAD | Freq: Every day | CUTANEOUS | Status: DC
  Administered 2021-07-01 – 2021-07-02 (×3): 6 via TOPICAL

## 2021-07-01 MED ORDER — EPINEPHRINE HCL 5 MG/250ML IV SOLN IN NS
0.5000 ug/min | INTRAVENOUS | Status: DC
  Administered 2021-07-01 (×2): 5 ug/min via INTRAVENOUS
  Administered 2021-07-02: 7 ug/min via INTRAVENOUS
  Filled 2021-07-01 (×3): qty 250

## 2021-07-01 MED ORDER — DEXTROSE 50 % IV SOLN
0.5000 g/kg | Freq: Once | INTRAVENOUS | Status: AC | PRN
  Administered 2021-07-01: 46.25 g via INTRAVENOUS

## 2021-07-01 MED ORDER — BENZOCAINE 10 % MT GEL
1.0000 "application " | Freq: Every day | OROMUCOSAL | Status: DC | PRN
  Administered 2021-07-03: 1 via OROMUCOSAL
  Filled 2021-07-01: qty 9

## 2021-07-01 MED ORDER — ACETAMINOPHEN 325 MG PO TABS
650.0000 mg | ORAL_TABLET | Freq: Four times a day (QID) | ORAL | Status: DC | PRN
  Administered 2021-07-01: 650 mg via ORAL
  Filled 2021-07-01: qty 2

## 2021-07-01 MED ORDER — POLYETHYLENE GLYCOL 3350 17 G PO PACK
17.0000 g | PACK | Freq: Every day | ORAL | Status: DC | PRN
  Administered 2021-07-02: 17 g via ORAL
  Filled 2021-07-01: qty 1

## 2021-07-01 MED ORDER — DEXTROSE 10% IV SOLUTION (ML/KG/HR)
5.0000 mL/kg/h | INTRAVENOUS | Status: DC
  Administered 2021-07-01 – 2021-07-03 (×15): 5 mL/kg/h via INTRAVENOUS
  Filled 2021-07-01 (×31): qty 1000

## 2021-07-01 MED ORDER — DEXTROSE 10 % IV SOLN: INTRAVENOUS | Status: DC

## 2021-07-01 MED ORDER — INSULIN ASPART 100 UNIT/ML IJ SOLN: 92.0000 [IU] | Freq: Once | INTRAMUSCULAR | Status: DC

## 2021-07-01 MED ORDER — PANTOPRAZOLE SODIUM 40 MG PO TBEC
40.0000 mg | DELAYED_RELEASE_TABLET | Freq: Every day | ORAL | Status: DC
  Administered 2021-07-02: 40 mg via ORAL
  Filled 2021-07-01 (×3): qty 1

## 2021-07-01 MED ORDER — SODIUM CHLORIDE 0.9% FLUSH: 10.0000 mL | INTRAVENOUS | Status: DC | PRN

## 2021-07-01 MED ORDER — CHARCOAL ACTIVATED PO LIQD: 50.0000 g | Freq: Once | ORAL | Status: DC

## 2021-07-01 MED ORDER — DEXTROSE 50 % IV SOLN
2.0000 | Freq: Once | INTRAVENOUS | Status: DC
Start: 2021-07-01 — End: 2021-07-01

## 2021-07-01 MED ORDER — CALCIUM CARBONATE ANTACID 500 MG PO CHEW
1.0000 | CHEWABLE_TABLET | Freq: Every day | ORAL | Status: DC | PRN
  Administered 2021-07-01 – 2021-07-02 (×2): 200 mg via ORAL
  Filled 2021-07-01 (×3): qty 1

## 2021-07-01 MED ORDER — GABAPENTIN 100 MG PO CAPS: 200.0000 mg | ORAL_CAPSULE | Freq: Three times a day (TID) | ORAL | Status: DC

## 2021-07-01 MED ORDER — ATROPINE SULFATE 1 MG/10ML IJ SOSY
PREFILLED_SYRINGE | INTRAMUSCULAR | Status: AC
  Filled 2021-07-01: qty 10

## 2021-07-01 MED ORDER — NOREPINEPHRINE 4 MG/250ML-% IV SOLN: 0.0000 ug/min | INTRAVENOUS | Status: DC

## 2021-07-01 MED ORDER — SODIUM CHLORIDE 0.9 % IV SOLN
0.0000 [IU]/kg/h | INTRAVENOUS | Status: DC
  Administered 2021-07-01 – 2021-07-03 (×3): 1 [IU]/kg/h via INTRAVENOUS
  Filled 2021-07-01 (×5): qty 10

## 2021-07-01 MED ORDER — HYDROXYZINE HCL 10 MG PO TABS: 10.0000 mg | ORAL_TABLET | Freq: Three times a day (TID) | ORAL | Status: DC | PRN

## 2021-07-01 MED ORDER — SODIUM CHLORIDE 0.9% FLUSH
10.0000 mL | Freq: Two times a day (BID) | INTRAVENOUS | Status: DC
  Administered 2021-07-01 – 2021-07-03 (×4): 10 mL

## 2021-07-01 MED ORDER — POTASSIUM CHLORIDE CRYS ER 20 MEQ PO TBCR
20.0000 meq | EXTENDED_RELEASE_TABLET | ORAL | Status: AC
  Administered 2021-07-01 (×2): 20 meq via ORAL
  Filled 2021-07-01: qty 1

## 2021-07-01 MED ORDER — ALBUTEROL SULFATE (2.5 MG/3ML) 0.083% IN NEBU: 2.5000 mg | INHALATION_SOLUTION | Freq: Four times a day (QID) | RESPIRATORY_TRACT | Status: DC | PRN

## 2021-07-01 MED ORDER — IBUPROFEN 400 MG PO TABS: 400.0000 mg | ORAL_TABLET | Freq: Four times a day (QID) | ORAL | Status: DC | PRN

## 2021-07-01 MED ORDER — ESCITALOPRAM OXALATE 10 MG PO TABS: 10.0000 mg | ORAL_TABLET | Freq: Every day | ORAL | Status: DC

## 2021-07-01 MED ORDER — KETOROLAC TROMETHAMINE 15 MG/ML IJ SOLN
15.0000 mg | Freq: Once | INTRAMUSCULAR | Status: AC
  Administered 2021-07-01: 15 mg via INTRAVENOUS
  Filled 2021-07-01: qty 1

## 2021-07-01 MED ORDER — CARIPRAZINE HCL 3 MG PO CAPS: 3.0000 mg | ORAL_CAPSULE | Freq: Every day | ORAL | Status: DC

## 2021-07-01 MED ORDER — FLUTICASONE FUROATE-VILANTEROL 200-25 MCG/ACT IN AEPB
1.0000 | INHALATION_SPRAY | Freq: Every day | RESPIRATORY_TRACT | Status: DC
Start: 2021-07-01 — End: 2021-07-04
  Filled 2021-07-01: qty 28

## 2021-07-01 MED ORDER — POTASSIUM CHLORIDE 10 MEQ/50ML IV SOLN
10.0000 meq | INTRAVENOUS | Status: AC
  Administered 2021-07-01 (×4): 10 meq via INTRAVENOUS
  Filled 2021-07-01 (×4): qty 50

## 2021-07-01 MED ORDER — INSULIN HIGH DOSE BOLUS VIA INFUSION (FOR BETA BLOCKER / CALCIUM CHANNEL BLOCKER OVERDOSE)
1.0000 [IU]/kg | Freq: Once | INTRAVENOUS | Status: AC
Start: 2021-07-01 — End: 2021-07-01
  Administered 2021-07-01: 92.5 [IU] via INTRAVENOUS
  Filled 2021-07-01: qty 93

## 2021-07-01 MED ORDER — LACTATED RINGERS IV BOLUS
1000.0000 mL | Freq: Once | INTRAVENOUS | Status: AC
  Administered 2021-07-01: 1000 mL via INTRAVENOUS

## 2021-07-01 MED ORDER — MELATONIN 3 MG PO TABS
3.0000 mg | ORAL_TABLET | Freq: Every day | ORAL | Status: DC
  Administered 2021-07-01: 3 mg via ORAL
  Filled 2021-07-01 (×3): qty 1

## 2021-07-01 MED ORDER — DOCUSATE SODIUM 100 MG PO CAPS
100.0000 mg | ORAL_CAPSULE | Freq: Two times a day (BID) | ORAL | Status: DC | PRN
  Administered 2021-07-02: 100 mg via ORAL
  Filled 2021-07-01 (×2): qty 1

## 2021-07-01 MED ORDER — CALCIUM GLUCONATE-NACL 1-0.675 GM/50ML-% IV SOLN
1.0000 g | Freq: Once | INTRAVENOUS | Status: AC
Start: 2021-07-01 — End: 2021-07-01
  Administered 2021-07-01: 1000 mg via INTRAVENOUS
  Filled 2021-07-01: qty 50

## 2021-07-01 MED ORDER — GLUCAGON HCL RDNA (DIAGNOSTIC) 1 MG IJ SOLR
3.0000 mg | Freq: Once | INTRAMUSCULAR | Status: AC
  Administered 2021-07-01: 3 mg via INTRAVENOUS
  Filled 2021-07-01 (×2): qty 3

## 2021-07-01 NOTE — ED Notes (Signed)
IVC PAPERWORK COMPLETED AND HANDED OFF TO KAREN COBB RN

## 2021-07-01 NOTE — H&P (Signed)
NAME:  [name], MRN:  [id number], DOB:  1990-11-29, LOS: 0 ADMISSION DATE:  07/01/2021, CONSULTATION DATE:  07/01/21 REFERRING MD:  Doren Custard, CHIEF COMPLAINT:  SI   History of Present Illness:  31 year old woman with hx of myriad of psychiatric disorders and prior suicide attempts presenting after intentional ingestion of 223m of propranolol (2 weeks of her home pills) in attempt to kill herself.  Points to life stressors including family as preceding events.  Has been persistently bradycardic in ER despite fluids, glucagon and calcium so started on high insulin euglycemia protocol for beta blocker toxicity.  PCCM asked to admit.  Also seems to have overdosed on Gabapentin, protonix, Vraylar, lexapro  Pertinent  Medical History   Past Medical History:  Diagnosis Date   Acid reflux    Anxiety    Asthma    last attack 03/13/15 or 03/14/15   Autism    Carrier of fragile X syndrome    Chronic constipation    Depression    Drug-seeking behavior    Essential tremor    Headache    Overdose of acetaminophen 07/2017   and other meds   [place]ality disorder ([place]    [place]    Schiz[place]ive disorder, bipolar type ([place]    [place]    Last seizure December 2017   Sleep apnea      Significant Hospital Events: Including procedures, antibiotic start and stop dates in addition to other pertinent events   07/01/21  Interim History / Subjective:  Consulted See ROS  Objective   Blood pressure 127/60, pulse 85, temperature 98.6 F (37 C), resp. rate (!) 22, height _0  (1.676 m), weight 92.5 kg, SpO2 (!) 84 %.        Intake/Output Summary (Last 24 hours) at 07/01/2021 1556 Last data filed at 07/01/2021 1249 Gross per 24 hour  Intake 2000 ml  Output --  Net 2000 ml   Filed Weights   07/01/21 1057  Weight: 92.5 kg    Examination: General: No distress layingin bed HENT: MMM, trachea midline Lungs: Clear, no wheezing Cardiovascular:  RRR, ext warm Abdomen: soft, +BS Extremities: no edema Neuro: moves all 4 ext to command Psych: Aox3, clear speech, +SI  EKG okay BMP benign CBC with mild anemia  Resolved Hospital Problem list   N/A  Assessment & Plan:  Intentional propranolol overdose- EKG okay, no role for bicarb at present.  Hemodynamic effects will hopefully be done fairly quick.  History of several of these events. - High dose insulin with D10, ideally through PICC - BMP q6h, EKG q8h, bicarb PRN qRS widening - Epinephrine titrated to MAP 65, HR > 60 - Suicide precautions - Psychiatry consultation  Schizoaffective disorder, depression, autism - Hold home meds for now  Best Practice (right click and "Reselect all SmartList Selections" daily)   Diet/type: Regular consistency (see orders) DVT prophylaxis: not indicated GI prophylaxis: PPI Lines: PICC ordered  Foley:  N/A Code Status:  full code Last date of multidisciplinary goals of care discussion [pending,]  Labs   CBC: Recent Labs  Lab 06/26/21 0226 06/26/21 0847 07/01/21 1106  WBC 9.4 6.3 6.9  NEUTROABS 5.8 3.5 4.6  HGB 12.9 10.3* 11.2*  HCT 40.1 32.4* 37.1  MCV 82.0 82.7 85.5  PLT 298 201 2740   Basic Metabolic Panel: Recent Labs  Lab 06/26/21 0226 06/26/21 0847 07/01/21 1106  NA 138 138 141  K 4.1 3.7 4.1  CL 109 114* 111  CO2 _0 GLUCOSE 111* 99 105*  BUN 6 5* 8  CREATININE 0.74 0.62 0.77  CALCIUM 9.6 8.0* 8.8*  MG 1.8  --  1.9   GFR: Estimated Creatinine Clearance: 115.7 mL/min (by C-G formula based on SCr of 0.77 mg/dL). Recent Labs  Lab 06/26/21 0226 06/26/21 0847 07/01/21 1106  WBC 9.4 6.3 6.9    Liver Function Tests: Recent Labs  Lab 06/26/21 0226 06/26/21 0847 07/01/21 1106  AST _1 ALT _2 ALKPHOS 60 46 55  BILITOT 0.6 0.5 0.6  PROT 7.1 5.6* 6.3*  ALBUMIN 4.1 3.1* 3.6   No results for input(s): LIPASE, AMYLASE in the last 168 hours. No results for input(s): AMMONIA in the last  168 hours.  ABG    Component Value Date/Time   PHART 7.345 (L) 07/23/2017 0957   PCO2ART 29.7 (L) 07/23/2017 0957   PO2ART 111.0 (H) 07/23/2017 0957   HCO3 16.2 (L) 07/23/2017 0957   TCO2 24 07/02/2020 0449   ACIDBASEDEF 8.0 (H) 07/23/2017 0957   O2SAT 98.0 07/23/2017 0957     Coagulation Profile: No results for input(s): INR, PROTIME in the last 168 hours.  Cardiac Enzymes: No results for input(s): CKTOTAL, CKMB, CKMBINDEX, TROPONINI in the last 168 hours.  HbA1C: Hgb A1c MFr Bld  Date/Time Value Ref Range Status  03/02/2021 09:05 PM 5.1 4.8 - 5.6 % Final    Comment:    (NOTE) Pre diabetes:          5.7%-6.4%  Diabetes:              >6.4%  Glycemic control for   <7.0% adults with diabetes   07/02/2019 06:32 AM 5.0 4.8 - 5.6 % Final    Comment:    (NOTE) Pre diabetes:          5.7%-6.4% Diabetes:              >6.4% Glycemic control for   <7.0% adults with diabetes     CBG: Recent Labs  Lab 06/26/21 0817  GLUCAP 100*    Review of Systems:    Positive Symptoms in bold:  Constitutional fevers, chills, weight loss, fatigue, anorexia, malaise  Eyes decreased vision, double vision, eye irritation  Ears, Nose, Mouth, Throat sore throat, trouble swallowing, sinus congestion  Cardiovascular chest pain, paroxysmal nocturnal dyspnea, lower ext edema, palpitations   Respiratory SOB, cough, DOE, hemoptysis, wheezing  Gastrointestinal nausea, vomiting, diarrhea  Genitourinary burning with urination, trouble urinating  Musculoskeletal joint aches, joint swelling, back pain  Integumentary  rashes, skin lesions  Neurological focal weakness, focal numbness, trouble speaking, headaches, dizziness  Psychiatric depression, anxiety, confusion  Endocrine polyuria, polydipsia, cold intolerance, heat intolerance  Hematologic abnormal bruising, abnormal bleeding, unexplained nose bleeds  Allergic/Immunologic recurrent infections, hives, swollen lymph nodes     Past  Medical History:  She,  has a past medical history of Acid reflux, Anxiety, Asthma, Autism, Carrier of fragile X syndrome, Chronic constipation, Depression, Drug-seeking behavior, Essential tremor, Headache, Overdose of acetaminophen (07/2017), Personality disorder ([place]), Schizo-affective psychosis ([place]), Schizoaffective disorder, bipolar type ([place]), Seizures ([place]), and Sleep apnea.   Surgical History:   Past Surgical History:  Procedure Laterality Date   MOUTH SURGERY  2009 or 2010     Social History:   reports that she has quit smoking. Her smoking use included cigarettes. She has never used smokeless tobacco. She reports that she does not drink alcohol and does not use drugs.  Family History:  Her family history includes Asthma in her father; Mental illness in her father; PDD in her brother; Seizures in her brother.   Allergies Allergies  Allergen Reactions   Bee Venom Anaphylaxis   Coconut Flavor Anaphylaxis and Rash   Geodon [Ziprasidone Hcl] Other (See Comments)    Pt states that this medication causes paralysis of the mouth.     Haloperidol And Related Other (See Comments)    Pt states that this medication causes paralysis of the mouth, jaw locks up   Lithium Other (See Comments)    Seizure-like activity    Oxycodone Other (See Comments)    Hallucinations    Quetiapine Other (See Comments)    Pt states that this medication is too strong.    Shellfish Allergy Anaphylaxis   Phenergan [Promethazine Hcl] Other (See Comments)    Chest pain     Prilosec [Omeprazole] Nausea And Vomiting   Sulfa Antibiotics Other (See Comments)    Chest pain    Tegretol [Carbamazepine] Nausea And Vomiting   Prozac [Fluoxetine] Other (See Comments)    Increased Depression , Suicidal thoughts   Tape Other (See Comments)    Skin tears, can only tolerate paper tape.   Tylenol [Acetaminophen]     Unspecified reaction     Home Medications  Prior to Admission medications   Medication Sig  Start Date End Date Taking? Authorizing Provider  acetaminophen (TYLENOL) 325 MG tablet Take 2 tablets (650 mg total) by mouth every 6 (six) hours as needed. 05/30/21  Yes Wynona Dove A, DO  albuterol (VENTOLIN HFA) 108 (90 Base) MCG/ACT inhaler Inhale 2 puffs into the lungs every 6 (six) hours as needed for wheezing or shortness of breath.   Yes [provider]  benzocaine (ORAJEL) 10 % mucosal gel Use as directed 1 application. in the mouth or throat daily as needed for mouth pain.   Yes [provider]  budesonide-formoterol (SYMBICORT) 160-4.5 MCG/ACT inhaler Inhale 2 puffs into the lungs 2 (two) times daily.   Yes [provider]  calcium carbonate (TUMS - DOSED IN MG ELEMENTAL CALCIUM) 500 MG chewable tablet Chew 1 tablet by mouth daily as needed for indigestion or heartburn.   Yes [provider]  cariprazine (VRAYLAR) 3 MG capsule Take 3 mg by mouth daily.   Yes [provider]  diclofenac Sodium (VOLTAREN) 1 % GEL Apply 2 g topically 4 (four) times daily. 06/23/21  Yes Harris, Abigail, PA-C  escitalopram (LEXAPRO) 10 MG tablet Take 10 mg by mouth daily.   Yes [provider]  gabapentin (NEURONTIN) 100 MG capsule Take 2 capsules (200 mg total) by mouth 3 (three) times daily. 04/26/21 07/01/21 Yes Massengill, Ovid Curd, MD  hydrOXYzine (ATARAX) 10 MG tablet Take 10 mg by mouth 3 (three) times daily as needed for anxiety.   Yes [provider]  ibuprofen (ADVIL) 200 MG tablet Take 400-600 mg by mouth every 6 (six) hours as needed for moderate pain or headache.   Yes [provider]  lidocaine (LIDODERM) 5 % Place 1 patch onto the skin daily as needed. Remove & Discard patch within 12 hours or as directed by MD Patient taking differently: Place 1 patch onto the skin daily as needed (pain). Remove & Discard patch within 12 hours or as directed by MD 05/30/21  Yes Wynona Dove A, DO  melatonin 3 MG TABS tablet Take 3 mg by mouth at  bedtime.   Yes [provider]  ondansetron [facility])  4 MG tablet Take 4 mg by mouth every 8 (eight) hours as needed for nausea or vomiting.   Yes [provider]  pantoprazole (PROTONIX) 40 MG tablet TAKE 1 TABLET BY MOUTH DAILY Patient taking differently: 40 mg daily. 04/26/21  Yes Jeanie Sewer, NP  propranolol (INDERAL) 10 MG tablet Take 10 mg by mouth 2 (two) times daily.   Yes [provider]     Critical care time: 32 minutes

## 2021-07-01 NOTE — ED Notes (Signed)
Patient  belongings and IVC papers taken upstairs with the patient to the ICU. Apolonio Schneiders RN verbalizes understanding of awareness of belongings and said paperwork

## 2021-07-01 NOTE — Progress Notes (Signed)
Peripherally Inserted Central Catheter Placement  The IV Nurse has discussed with the patient and/or persons authorized to consent for the patient, the purpose of this procedure and the potential benefits and risks involved with this procedure.  The benefits include less needle sticks, lab draws from the catheter, and the patient may be discharged home with the catheter. Risks include, but not limited to, infection, bleeding, blood clot (thrombus formation), and puncture of an artery; nerve damage and irregular heartbeat and possibility to perform a PICC exchange if needed/ordered by physician.  Alternatives to this procedure were also discussed.  Bard Power PICC patient education guide, fact sheet on infection prevention and patient information card has been provided to patient /or left at bedside.  Pt alert and oriented x 4 and signed consent for PICC placement.  PICC Placement Documentation  PICC Triple Lumen 40/35/24 Right Basilic 36 cm 1 cm (Active)  Indication for Insertion or Continuance of Line Vasoactive infusions 07/01/21 1800  Exposed Catheter (cm) 1 cm 07/01/21 1800  Site Assessment Clean, Dry, Intact 07/01/21 1800  Lumen #1 Status Flushed;Saline locked;Blood return noted 07/01/21 1800  Lumen #2 Status Flushed;Saline locked;Blood return noted 07/01/21 1800  Lumen #3 Status Flushed;Saline locked;Blood return noted 07/01/21 1800  Dressing Type Transparent;Securing device 07/01/21 1800  Dressing Status Antimicrobial disc in place 07/01/21 1800  Safety Lock Not Applicable 81/85/90 9311  Line Care Connections checked and tightened 07/01/21 1800  Line Adjustment (NICU/IV Team Only) No 07/01/21 1800  Dressing Intervention New dressing 07/01/21 1800  Dressing Change Due 07/08/21 07/01/21 1800       Mickel Baas  Erleen Egner 07/01/2021, 6:26 PM

## 2021-07-01 NOTE — ED Provider Notes (Signed)
[facility] EMERGENCY DEPARTMENT Provider Note   CSN: [id number] Arrival date & time: 07/01/21  1055     History  Chief Complaint  Patient presents with   Ingestion   Suicidal    [name] is a 31 y.o. female.   Ingestion Patient presents for intentional overdose.  Medical history includes schizoaffective disorder, borderline personality disorder, PTSD, asthma, migraines, seizure disorder, previous acetaminophen overdose, depression, anxiety, and sleep apnea.  She had a recent intentional overdose 5 days ago.  Patient reports that she has had increased psychosocial stressors lately.  This includes being blamed for her deceased husband's death over social media and financial stressors.  This led her to attempt suicide 1 hour prior to arrival.  Patient reports that she took the following medications:  Gabapentin: 4.2g Protonix: '560mg'$  Propanolol: '280mg'$  Lexapro: '140mg'$  Vraylar: '42mg'$   These were dosed in pill packs which were brought in by EMS.  Patient called EMS herself after she began to feel lightheaded and nauseous. 4 mg of Zofran were given prior to arrival.      Home Medications Prior to Admission medications   Medication Sig Start Date End Date Taking? Authorizing Provider  acetaminophen (TYLENOL) 325 MG tablet Take 2 tablets (650 mg total) by mouth every 6 (six) hours as needed. 05/30/21  Yes Wynona Dove A, DO  albuterol (VENTOLIN HFA) 108 (90 Base) MCG/ACT inhaler Inhale 2 puffs into the lungs every 6 (six) hours as needed for wheezing or shortness of breath.   Yes [provider]  benzocaine (ORAJEL) 10 % mucosal gel Use as directed 1 application. in the mouth or throat daily as needed for mouth pain.   Yes [provider]  budesonide-formoterol (SYMBICORT) 160-4.5 MCG/ACT inhaler Inhale 2 puffs into the lungs 2 (two) times daily.   Yes [provider]  calcium carbonate (TUMS - DOSED IN MG ELEMENTAL CALCIUM) 500  MG chewable tablet Chew 1 tablet by mouth daily as needed for indigestion or heartburn.   Yes [provider]  cariprazine (VRAYLAR) 3 MG capsule Take 3 mg by mouth daily.   Yes [provider]  diclofenac Sodium (VOLTAREN) 1 % GEL Apply 2 g topically 4 (four) times daily. 06/23/21  Yes Harris, Abigail, PA-C  escitalopram (LEXAPRO) 10 MG tablet Take 10 mg by mouth daily.   Yes [provider]  gabapentin (NEURONTIN) 100 MG capsule Take 2 capsules (200 mg total) by mouth 3 (three) times daily. 04/26/21 07/01/21 Yes Massengill, Ovid Curd, MD  hydrOXYzine (ATARAX) 10 MG tablet Take 10 mg by mouth 3 (three) times daily as needed for anxiety.   Yes [provider]  ibuprofen (ADVIL) 200 MG tablet Take 400-600 mg by mouth every 6 (six) hours as needed for moderate pain or headache.   Yes [provider]  lidocaine (LIDODERM) 5 % Place 1 patch onto the skin daily as needed. Remove & Discard patch within 12 hours or as directed by MD Patient taking differently: Place 1 patch onto the skin daily as needed (pain). Remove & Discard patch within 12 hours or as directed by MD 05/30/21  Yes Wynona Dove A, DO  melatonin 3 MG TABS tablet Take 3 mg by mouth at bedtime.   Yes [provider]  ondansetron (ZOFRAN) 4 MG tablet Take 4 mg by mouth every 8 (eight) hours as needed for nausea or vomiting.   Yes [provider]  pantoprazole (PROTONIX) 40 MG tablet TAKE 1 TABLET BY MOUTH DAILY Patient taking  differently: 40 mg daily. 04/26/21  Yes Jeanie Sewer, NP  propranolol (INDERAL) 10 MG tablet Take 10 mg by mouth 2 (two) times daily.   Yes [provider]      Allergies    Bee venom, Coconut flavor, Geodon [ziprasidone hcl], Haloperidol and related, Lithium, Oxycodone, Quetiapine, Shellfish allergy, Phenergan [promethazine hcl], Prilosec [omeprazole], Sulfa antibiotics, Tegretol [carbamazepine], Prozac [fluoxetine], Tape, and Tylenol [acetaminophen]     Review of Systems   Review of Systems  Constitutional:  Positive for fatigue.  Gastrointestinal:  Positive for nausea.  Neurological:  Positive for light-headedness.  Psychiatric/Behavioral:  Positive for self-injury and suicidal ideas.   All other systems reviewed and are negative.  Physical Exam Updated Vital Signs BP 129/63   Pulse (!) 49   Temp 98.6 F (37 C)   Resp 15   Ht '5\' 6"'$  (1.676 m)   Wt 92.5 kg   SpO2 99%   BMI 32.93 kg/m  Physical Exam Vitals and nursing note reviewed.  Constitutional:      General: She is not in acute distress.    Appearance: Normal appearance. She is well-developed. She is not ill-appearing, toxic-appearing or diaphoretic.  HENT:     Head: Normocephalic and atraumatic.     Right Ear: External ear normal.     Left Ear: External ear normal.     Nose: Nose normal.     Mouth/Throat:     Mouth: Mucous membranes are moist.     Pharynx: Oropharynx is clear.  Eyes:     Extraocular Movements: Extraocular movements intact.     Conjunctiva/sclera: Conjunctivae normal.  Cardiovascular:     Rate and Rhythm: Normal rate and regular rhythm.     Heart sounds: No murmur heard. Pulmonary:     Effort: Pulmonary effort is normal. No respiratory distress.     Breath sounds: Normal breath sounds. No wheezing or rales.  Chest:     Chest wall: No tenderness.  Abdominal:     Palpations: Abdomen is soft.     Tenderness: There is no abdominal tenderness.  Musculoskeletal:        General: No swelling. Normal range of motion.     Cervical back: Normal range of motion and neck supple.     Right lower leg: No edema.     Left lower leg: No edema.  Skin:    General: Skin is warm and dry.     Capillary Refill: Capillary refill takes less than 2 seconds.     Coloration: Skin is pale.  Neurological:     General: No focal deficit present.     Mental Status: She is alert and oriented to person, place, and time.     Cranial Nerves: No cranial nerve deficit.      Sensory: No sensory deficit.     Motor: No weakness.     Coordination: Coordination normal.  Psychiatric:        Attention and Perception: Perception normal.        Mood and Affect: Mood is depressed.        Speech: Speech normal. Speech is not slurred.        Behavior: Behavior is cooperative.        Thought Content: Thought content includes suicidal ideation. Thought content includes suicidal plan.    ED Results / Procedures / Treatments   Labs (all labs ordered are listed, but only abnormal results are displayed) Labs Reviewed  COMPREHENSIVE METABOLIC PANEL - Abnormal; Notable for the following  components:      Result Value   Glucose, Bld 105 (*)    Calcium 8.8 (*)    Total Protein 6.3 (*)    All other components within normal limits  SALICYLATE LEVEL - Abnormal; Notable for the following components:   Salicylate Lvl <0.3 (*)    All other components within normal limits  ACETAMINOPHEN LEVEL - Abnormal; Notable for the following components:   Acetaminophen (Tylenol), Serum <10 (*)    All other components within normal limits  CBC WITH DIFFERENTIAL/PLATELET - Abnormal; Notable for the following components:   Hemoglobin 11.2 (*)    MCH 25.8 (*)    All other components within normal limits  CBG MONITORING, ED - Abnormal; Notable for the following components:   Glucose-Capillary 187 (*)    All other components within normal limits  CBG MONITORING, ED - Abnormal; Notable for the following components:   Glucose-Capillary 494 (*)    All other components within normal limits  ETHANOL  RAPID URINE DRUG SCREEN, HOSP PERFORMED  MAGNESIUM  ACETAMINOPHEN LEVEL  HIV ANTIBODY (ROUTINE TESTING W REFLEX)  BASIC METABOLIC PANEL  BASIC METABOLIC PANEL  BASIC METABOLIC PANEL  I-STAT BETA HCG BLOOD, ED ([facility], [facility], [facility] ONLY)  CBG MONITORING, ED  CBG MONITORING, ED    EKG EKG Interpretation  Date/Time:  Saturday Jul 01 2021 10:56:08 EDT Ventricular Rate:  65 PR Interval:  146 QRS  Duration: 83 QT Interval:  422 QTC Calculation: 439 R Axis:   62 Text Interpretation: Sinus rhythm Confirmed by Godfrey Pick ([id number]) on 07/01/2021 12:04:20 PM  Radiology [name] EKG SITE RITE  Result Date: 07/01/2021 If Site Rite image not attached, placement could not be confirmed due to current cardiac rhythm.   Procedures Procedures    Medications Ordered in ED Medications  EPINEPHrine (ADRENALIN) 5 mg in NS 250 mL (0.02 mg/mL) premix infusion (5 mcg/min Intravenous New Bag/Given 07/01/21 1500)  insulin HIGH DOSE (4 units/mL) infusion for beta blocker/calcium channel blocker overdose (1 Units/kg/hr  92.5 kg Intravenous New Bag/Given 07/01/21 1619)  dextrose 10 % infusion (5 mL/kg/hr  92.5 kg Intravenous New Bag/Given 07/01/21 1600)  docusate sodium (COLACE) capsule 100 mg (has no administration in time range)  polyethylene glycol (MIRALAX / GLYCOLAX) packet 17 g (has no administration in time range)  acetaminophen (TYLENOL) tablet 650 mg (has no administration in time range)  albuterol (PROVENTIL) (2.5 MG/3ML) 0.083% nebulizer solution 2.5 mg (has no administration in time range)  benzocaine (ORAJEL) 10 % mucosal gel 1 application. (has no administration in time range)  fluticasone furoate-vilanterol (BREO ELLIPTA) 200-25 MCG/ACT 1 puff (1 puff Inhalation Not Given 07/01/21 1613)  calcium carbonate (TUMS - dosed in mg elemental calcium) chewable tablet 200 mg of elemental calcium (has no administration in time range)  melatonin tablet 3 mg (has no administration in time range)  pantoprazole (PROTONIX) EC tablet 40 mg (has no administration in time range)  lactated ringers bolus 1,000 mL (0 mLs Intravenous Stopped 07/01/21 1248)  glucagon (human recombinant) (GLUCAGEN) injection 3 mg (3 mg Intravenous Given 07/01/21 1400)  calcium gluconate 1 g/ 50 mL sodium chloride IVPB (0 mg Intravenous Stopped 07/01/21 1618)  insulin HIGH DOSE bolus via infusion (for beta blocker / calcium channel blocker  overdose) (92.5 Units Intravenous Bolus from Bag 07/01/21 1603)  dextrose 50 % solution 46.25 g (46.25 g Intravenous Given 07/01/21 1609)    ED Course/ Medical Decision Making/ A&P  Medical Decision Making Amount and/or Complexity of Data Reviewed Labs: ordered.  Risk Prescription drug management. Decision regarding hospitalization.   This patient presents to the ED for concern of suicide attempt, this involves an extensive number of treatment options, and is a complaint that carries with it a high risk of complications and morbidity.  The differential diagnosis includes beta-blocker toxicity, serotonin syndrome, harmful drug interactions   Co morbidities that complicate the patient evaluation  schizoaffective disorder, borderline personality disorder, PTSD, asthma, migraines, seizure disorder, previous acetaminophen overdose, depression, anxiety, and sleep apnea   Additional history obtained:  Additional history obtained from N/A External records from outside source obtained and reviewed including EMR   Lab Tests:  I Ordered, and personally interpreted labs.  The pertinent results include: Normal electrolytes, negative UDS, no leukocytosis, baseline anemia, negative alcohol, negative acetaminophen and salicylate levels  Cardiac Monitoring: / EKG:  The patient was maintained on a cardiac monitor.  I personally viewed and interpreted the cardiac monitored which showed an underlying rhythm of: Sinus rhythm   Consultations Obtained:  I requested consultation with the poison control, critical care,  and discussed lab and imaging findings as well as pertinent plan - they recommend: Poison control recommended IV fluids and obtaining initial EKG and monitoring for symptoms of beta-blocker toxicity.  When patient became hypotensive and bradycardic, they agree with initiation of pressor medications with high-dose insulin therapy for refractory hypotension and  bradycardia.  Critical care agrees to admit to ICU.   Problem List / ED Course / Critical interventions / Medication management  Patient is a 31 year old female presenting 1 hour after an intentional overdose of multiple home medications.  Most notable among these is propanolol, of which patient took 240 mg.  On arrival in the ED, she is alert and oriented.  Her vital signs are initially normal.  Patient refuses activated charcoal.  Given that it has been 1 hour, efficacy may be limited anyway.  Initial EKG showed narrow QRS complex with normal QTc.  Laboratory work-up was initiated.  Patient was given bolus of IV fluids.  I did discuss his case with poison control who agrees with initial care and recommends observation of 6 to 8 hours for possible symptoms of beta-blocker toxicity.  Given the patient's suicide attempt, IVC was placed.  On repeat EKG, patient continues to have narrow complex.  She does, however, have a lengthening QTc.  QTc prolonging agents were avoided.  Patient subsequently had softening blood pressures.  At this point, patient was given 3 mg of glucagon and 1 g of calcium gluconate, per conversation with pharmacy.  As needed epinephrine was ordered.  Patient continued to have hypotension and epinephrine was started at 5 mcg/min.  She subsequently became bradycardic.  At this point, in consultation with pharmacy, high-dose insulin therapy was ordered.  I did update poison control, who agrees with care.  Patient was admitted to ICU for further management. I ordered medication including IV fluids for overdose; glucagon, calcium gluconate, and epinephrine gtt. for symptomatic beta-blocker toxicity; high-dose insulin therapy with dextrose containing fluids for refractory bradycardia in the setting of beta-blocker toxicity. Reevaluation of the patient after these medicines showed that the patient improved I have reviewed the patients home medicines and have made adjustments as  needed   Social Determinants of Health:  History of psychiatric illness with multiple suicide attempts in the past.  CRITICAL CARE Performed by: Godfrey Pick   Total critical care time: 65 minutes  Critical care time was  exclusive of separately billable procedures and treating other patients.  Critical care was necessary to treat or prevent imminent or life-threatening deterioration.  Critical care was time spent personally by me on the following activities: development of treatment plan with patient and/or surrogate as well as nursing, discussions with consultants, evaluation of patient's response to treatment, examination of patient, obtaining history from patient or surrogate, ordering and performing treatments and interventions, ordering and review of laboratory studies, ordering and review of radiographic studies, pulse oximetry and re-evaluation of patient's condition.         Final Clinical Impression(s) / ED Diagnoses Final diagnoses:  Suicide attempt [facility])  Intentional propranolol overdose, initial encounter ([place])  Hypotension due to drugs  Bradycardia    Rx / DC Orders ED Discharge Orders     None         Godfrey Pick, MD 07/01/21 1637

## 2021-07-01 NOTE — ED Notes (Signed)
Provider notified in regards to reconsider the idea of a central line for patient safety

## 2021-07-01 NOTE — ED Notes (Addendum)
This RN attempts to give report to the nurse Apolonio Schneiders who is receiving the patient in the ICU. Lattie Haw RN and Roselyn Reef RN made aware of the patient situation.

## 2021-07-01 NOTE — Progress Notes (Signed)
[facility] ADULT ICU REPLACEMENT PROTOCOL   The patient does apply for the [facility] Adult ICU Electrolyte Replacment Protocol based on the criteria listed below:   1.Exclusion criteria: TCTS patients, ECMO patients, and Dialysis patients 2. Is GFR >/= 30 ml/min? Yes.    Patient's GFR today is > 60 3. Is SCr </= 2? Yes.   Patient's SCr is 0.70 mg/dL 4. Did SCr increase >/= 0.5 in 24 hours? No. 5.Pt's weight >40kg  Yes.   6. Abnormal electrolyte(s): K+ 3.1  7. Electrolytes replaced per protocol 8.  Call MD STAT for K+ </= 2.5, Phos </= 1, or Mag </= 1 Physician:  Dr Marquette Old, Elfredia Nevins 07/01/2021 7:53 PM

## 2021-07-01 NOTE — Progress Notes (Signed)
Attempted to receive report from ED, left phone number. RN to call back

## 2021-07-01 NOTE — ED Notes (Signed)
Spoke with IV team at this time who state they will be down to place a central

## 2021-07-01 NOTE — ED Notes (Signed)
Upon time of transfer [name] was notified by this RN that the provider requested CBG q20 minuets [name] reports understanding

## 2021-07-01 NOTE — ED Notes (Signed)
Provider at bedside regarding the patient's bradycardia

## 2021-07-01 NOTE — ED Notes (Signed)
Per pharmacist Jenny Reichmann, D10 is safe to run through the peripheral IV line temporarily

## 2021-07-02 DIAGNOSIS — T447X2A Poisoning by beta-adrenoreceptor antagonists, intentional self-harm, initial encounter: Secondary | ICD-10-CM | POA: Diagnosis not present

## 2021-07-02 DIAGNOSIS — X838XXA Intentional self-harm by other specified means, initial encounter: Secondary | ICD-10-CM | POA: Diagnosis not present

## 2021-07-02 LAB — GLUCOSE, CAPILLARY
Glucose-Capillary: 131 mg/dL — ABNORMAL HIGH (ref 70–99)
Glucose-Capillary: 139 mg/dL — ABNORMAL HIGH (ref 70–99)
Glucose-Capillary: 142 mg/dL — ABNORMAL HIGH (ref 70–99)
Glucose-Capillary: 143 mg/dL — ABNORMAL HIGH (ref 70–99)
Glucose-Capillary: 145 mg/dL — ABNORMAL HIGH (ref 70–99)
Glucose-Capillary: 146 mg/dL — ABNORMAL HIGH (ref 70–99)
Glucose-Capillary: 147 mg/dL — ABNORMAL HIGH (ref 70–99)
Glucose-Capillary: 149 mg/dL — ABNORMAL HIGH (ref 70–99)
Glucose-Capillary: 153 mg/dL — ABNORMAL HIGH (ref 70–99)
Glucose-Capillary: 154 mg/dL — ABNORMAL HIGH (ref 70–99)
Glucose-Capillary: 163 mg/dL — ABNORMAL HIGH (ref 70–99)
Glucose-Capillary: 171 mg/dL — ABNORMAL HIGH (ref 70–99)
Glucose-Capillary: 174 mg/dL — ABNORMAL HIGH (ref 70–99)
Glucose-Capillary: 174 mg/dL — ABNORMAL HIGH (ref 70–99)
Glucose-Capillary: 179 mg/dL — ABNORMAL HIGH (ref 70–99)
Glucose-Capillary: 180 mg/dL — ABNORMAL HIGH (ref 70–99)
Glucose-Capillary: 187 mg/dL — ABNORMAL HIGH (ref 70–99)
Glucose-Capillary: 188 mg/dL — ABNORMAL HIGH (ref 70–99)
Glucose-Capillary: 189 mg/dL — ABNORMAL HIGH (ref 70–99)
Glucose-Capillary: 195 mg/dL — ABNORMAL HIGH (ref 70–99)
Glucose-Capillary: 195 mg/dL — ABNORMAL HIGH (ref 70–99)
Glucose-Capillary: 197 mg/dL — ABNORMAL HIGH (ref 70–99)
Glucose-Capillary: 198 mg/dL — ABNORMAL HIGH (ref 70–99)

## 2021-07-02 LAB — BASIC METABOLIC PANEL
Anion gap: 5 (ref 5–15)
Anion gap: 4 — ABNORMAL LOW (ref 5–15)
Anion gap: 4 — ABNORMAL LOW (ref 5–15)
BUN: 5 mg/dL — ABNORMAL LOW (ref 6–20)
BUN: <5 mg/dL — ABNORMAL LOW (ref 6–20)
BUN: <5 mg/dL — ABNORMAL LOW (ref 6–20)
CO2: 20 mmol/L — ABNORMAL LOW (ref 22–32)
CO2: 21 mmol/L — ABNORMAL LOW (ref 22–32)
CO2: 21 mmol/L — ABNORMAL LOW (ref 22–32)
Calcium: 9 mg/dL (ref 8.9–10.3)
Calcium: 8.9 mg/dL (ref 8.9–10.3)
Calcium: 8.9 mg/dL (ref 8.9–10.3)
Chloride: 113 mmol/L — ABNORMAL HIGH (ref 98–111)
Chloride: 113 mmol/L — ABNORMAL HIGH (ref 98–111)
Chloride: 112 mmol/L — ABNORMAL HIGH (ref 98–111)
Creatinine, Ser: 0.7 mg/dL (ref 0.44–1.00)
Creatinine, Ser: 0.7 mg/dL (ref 0.44–1.00)
Creatinine, Ser: 0.75 mg/dL (ref 0.44–1.00)
GFR, Estimated: >60 mL/min (ref >60)
GFR, Estimated: >60 mL/min (ref >60)
GFR, Estimated: >60 mL/min (ref >60)
Glucose, Bld: 189 mg/dL — ABNORMAL HIGH (ref 70–99)
Glucose, Bld: 105 mg/dL — ABNORMAL HIGH (ref 70–99)
Glucose, Bld: 182 mg/dL — ABNORMAL HIGH (ref 70–99)
Potassium: 4.1 mmol/L (ref 3.5–5.1)
Potassium: 4.2 mmol/L (ref 3.5–5.1)
Potassium: 4 mmol/L (ref 3.5–5.1)
Sodium: 138 mmol/L (ref 135–145)
Sodium: 138 mmol/L (ref 135–145)
Sodium: 137 mmol/L (ref 135–145)

## 2021-07-02 LAB — MAGNESIUM: Magnesium: 1.5 mg/dL — ABNORMAL LOW (ref 1.7–2.4)

## 2021-07-02 MED ORDER — LACTULOSE 10 GM/15ML PO SOLN
20.0000 g | Freq: Three times a day (TID) | ORAL | Status: AC
  Administered 2021-07-02 (×2): 20 g via ORAL
  Filled 2021-07-02 (×3): qty 30

## 2021-07-02 MED ORDER — PROCHLORPERAZINE EDISYLATE 10 MG/2ML IJ SOLN
5.0000 mg | Freq: Once | INTRAMUSCULAR | Status: DC
  Filled 2021-07-02 (×2): qty 1

## 2021-07-02 MED ORDER — ONDANSETRON HCL 4 MG/2ML IJ SOLN
4.0000 mg | Freq: Once | INTRAMUSCULAR | Status: AC
  Administered 2021-07-02: 4 mg via INTRAVENOUS
  Filled 2021-07-02: qty 2

## 2021-07-02 MED ORDER — ENOXAPARIN SODIUM 40 MG/0.4ML IJ SOSY
40.0000 mg | PREFILLED_SYRINGE | INTRAMUSCULAR | Status: DC
  Filled 2021-07-02 (×2): qty 0.4

## 2021-07-02 MED ORDER — MAGNESIUM SULFATE 2 GM/50ML IV SOLN
2.0000 g | Freq: Once | INTRAVENOUS | Status: AC
  Administered 2021-07-02: 2 g via INTRAVENOUS
  Filled 2021-07-02: qty 50

## 2021-07-02 MED ORDER — PHENYLEPHRINE HCL-NACL 20-0.9 MG/250ML-% IV SOLN
0.0000 ug/min | INTRAVENOUS | Status: DC
  Administered 2021-07-03: 40 ug/min via INTRAVENOUS
  Filled 2021-07-02: qty 250

## 2021-07-02 MED ORDER — PHENYLEPHRINE HCL-NACL 20-0.9 MG/250ML-% IV SOLN
INTRAVENOUS | Status: AC
Start: 2021-07-02 — End: 2021-07-02
  Administered 2021-07-02: 20 ug/min via INTRAVENOUS
  Filled 2021-07-02: qty 250

## 2021-07-02 NOTE — Progress Notes (Signed)
eLink Physician-Brief Progress Note Patient Name: [name] DOB: October 07, 1990 MRN: [id number]   Date of Service  07/02/2021  HPI/Events of Note  Notified of patient complaint of nausea and abdominal pain.   Qtc is >500.   eICU Interventions  Trial of compazine although also listed intolerance to compazine in the past.  Continue lactulose for constipation.  Tylenol prn as ordered.     Intervention Category Minor Interventions: Other:  Elsie Lincoln 07/02/2021, 9:23 PM

## 2021-07-02 NOTE — Progress Notes (Signed)
NAME:  [name], MRN:  [id number], DOB:  1990-04-19, LOS: 1 ADMISSION DATE:  07/01/2021, CONSULTATION DATE:  07/01/21 REFERRING MD:  Doren Custard, CHIEF COMPLAINT:  SI   History of Present Illness:  31 year old woman with hx of myriad of psychiatric disorders and prior suicide attempts presenting after intentional ingestion of 265m of propranolol (2 weeks of her home pills) in attempt to kill herself.  Points to life stressors including family as preceding events.  Has been persistently bradycardic in ER despite fluids, glucagon and calcium so started on high insulin euglycemia protocol for beta blocker toxicity.  PCCM asked to admit.  Also seems to have overdosed on Gabapentin, protonix, Vraylar, lexapro  Pertinent  Medical History   Past Medical History:  Diagnosis Date   Acid reflux    Anxiety    Asthma    last attack 03/13/15 or 03/14/15   Autism    Carrier of fragile X syndrome    Chronic constipation    Depression    Drug-seeking behavior    Essential tremor    Headache    Overdose of acetaminophen 07/2017   and other meds   Personality disorder ([facility]    Schizo-affective psychosis ([facility] [place]    Schizoaffective disorder, bipolar type ([place]    Seizures ([place]    Last seizure December 2017   Sleep apnea      Significant Hospital Events: Including procedures, antibiotic start and stop dates in addition to other pertinent events   07/01/21  Interim History / Subjective:   Critically ill intensive care unit still on vasopressor support.  Objective   Blood pressure (!) 116/55, pulse 71, temperature 98.1 F (36.7 C), temperature source Oral, resp. rate 19, height 5' 6"  (1.676 m), weight 93.4 kg, SpO2 96 %.        Intake/Output Summary (Last 24 hours) at 07/02/2021 0750 Last data filed at 07/02/2021 [id number] per 24 hour  Intake 11154.33 ml  Output 5375 ml  Net 5779.33 ml   Filed Weights   07/01/21 1057 07/02/21 0500  Weight: 92.5 kg 93.4 kg     Examination: General: Young female resting in bed HENT: Mucous membranes moist, trachea midline Lungs: Clear to auscultation bilaterally no wheeze Cardiovascular: Regular rate rhythm, S1-S2 Abdomen: Soft, nontender nondistended Extremities: No significant edema Neuro: Moves all 4 extremities following commands Psych: Alert and oriented  EKG okay BMP benign CBC with mild anemia  Resolved Hospital Problem list   N/A  Assessment & Plan:   Intentional propranolol overdose- EKG okay, no role for bicarb at present.  Hemodynamic effects will hopefully be done fairly quick.  History of several of these events. Plan: Continue high-dose insulin D10 plus insulin infusion Check blood glucoses they appear stable Heart rate still in the low 50s Remains on epinephrine infusion. Suicide precautions  Schizoaffective disorder, depression, autism -Holding home meds  Constipation Plan: Lactulose x3 doses  Best Practice (right click and "Reselect all SmartList Selections" daily)   Diet/type: Regular consistency (see orders) DVT prophylaxis: not indicated GI prophylaxis: PPI Lines: PICC ordered  Foley:  N/A Code Status:  full code Last date of multidisciplinary goals of care discussion [pending,]  Labs   CBC: Recent Labs  Lab 06/26/21 0226 06/26/21 0847 07/01/21 1106  WBC 9.4 6.3 6.9  NEUTROABS 5.8 3.5 4.6  HGB 12.9 10.3* 11.2*  HCT 40.1 32.4* 37.1  MCV 82.0 82.7 85.5  PLT 298 201 2086   Basic Metabolic Panel: Recent Labs  Lab 06/26/21  5643 06/26/21 0847 07/01/21 1106 07/01/21 1832 07/02/21 0138  NA 138 138 141 138 138  K 4.1 3.7 4.1 3.1* 4.1  CL 109 114* 111 112* 113*  CO2 22 22 22  21* 20*  GLUCOSE 111* 99 105* 91 189*  BUN 6 5* 8 7 5*  CREATININE 0.74 0.62 0.77 0.70 0.70  CALCIUM 9.6 8.0* 8.8* 8.5* 9.0  MG 1.8  --  1.9  --   --    GFR: Estimated Creatinine Clearance: 116.2 mL/min (by C-G formula based on SCr of 0.7 mg/dL). Recent Labs  Lab  06/26/21 0226 06/26/21 0847 07/01/21 1106  WBC 9.4 6.3 6.9    Liver Function Tests: Recent Labs  Lab 06/26/21 0226 06/26/21 0847 07/01/21 1106  AST 21 15 16   ALT 20 15 15   ALKPHOS 60 46 55  BILITOT 0.6 0.5 0.6  PROT 7.1 5.6* 6.3*  ALBUMIN 4.1 3.1* 3.6   No results for input(s): LIPASE, AMYLASE in the last 168 hours. No results for input(s): AMMONIA in the last 168 hours.  ABG    Component Value Date/Time   PHART 7.345 (L) 07/23/2017 0957   PCO2ART 29.7 (L) 07/23/2017 0957   PO2ART 111.0 (H) 07/23/2017 0957   HCO3 16.2 (L) 07/23/2017 0957   TCO2 24 07/02/2020 0449   ACIDBASEDEF 8.0 (H) 07/23/2017 0957   O2SAT 98.0 07/23/2017 0957     Coagulation Profile: No results for input(s): INR, PROTIME in the last 168 hours.  Cardiac Enzymes: No results for input(s): CKTOTAL, CKMB, CKMBINDEX, TROPONINI in the last 168 hours.  HbA1C: Hgb A1c MFr Bld  Date/Time Value Ref Range Status  03/02/2021 09:05 PM 5.1 4.8 - 5.6 % Final    Comment:    (NOTE) Pre diabetes:          5.7%-6.4%  Diabetes:              >6.4%  Glycemic control for   <7.0% adults with diabetes   07/02/2019 06:32 AM 5.0 4.8 - 5.6 % Final    Comment:    (NOTE) Pre diabetes:          5.7%-6.4% Diabetes:              >6.4% Glycemic control for   <7.0% adults with diabetes     CBG: Recent Labs  Lab 07/02/21 0339 07/02/21 0436 07/02/21 0545 07/02/21 0640 07/02/21 0731  GLUCAP 139* 145* 149* 153* 147*    Review of Systems:    Positive Symptoms in bold:  Constitutional fevers, chills, weight loss, fatigue, anorexia, malaise  Eyes decreased vision, double vision, eye irritation  Ears, Nose, Mouth, Throat sore throat, trouble swallowing, sinus congestion  Cardiovascular chest pain, paroxysmal nocturnal dyspnea, lower ext edema, palpitations   Respiratory SOB, cough, DOE, hemoptysis, wheezing  Gastrointestinal nausea, vomiting, diarrhea  Genitourinary burning with urination, trouble  urinating  Musculoskeletal joint aches, joint swelling, back pain  Integumentary  rashes, skin lesions  Neurological focal weakness, focal numbness, trouble speaking, headaches, dizziness  Psychiatric depression, anxiety, confusion  Endocrine polyuria, polydipsia, cold intolerance, heat intolerance  Hematologic abnormal bruising, abnormal bleeding, unexplained nose bleeds  Allergic/Immunologic recurrent infections, hives, swollen lymph nodes     Past Medical History:  She,  has a past medical history of Acid reflux, Anxiety, Asthma, Autism, Carrier of fragile X syndrome, Chronic constipation, Depression, Drug-seeking behavior, Essential tremor, Headache, Overdose of acetaminophen (07/2017), Personality disorder ([place]), Schizo-affective psychosis ([place]), Schizoaffective disorder, bipolar type ([place]), Seizures ([place]), and Sleep apnea.  Surgical History:   Past Surgical History:  Procedure Laterality Date   MOUTH SURGERY  2009 or 2010     Social History:   reports that she has quit smoking. Her smoking use included cigarettes. She has never used smokeless tobacco. She reports that she does not drink alcohol and does not use drugs.   Family History:  Her family history includes Asthma in her father; Mental illness in her father; PDD in her brother; Seizures in her brother.   Allergies Allergies  Allergen Reactions   Bee Venom Anaphylaxis   Coconut Flavor Anaphylaxis and Rash   Geodon [Ziprasidone Hcl] Other (See Comments)    Pt states that this medication causes paralysis of the mouth.     Haloperidol And Related Other (See Comments)    Pt states that this medication causes paralysis of the mouth, jaw locks up   Lithium Other (See Comments)    Seizure-like activity    Oxycodone Other (See Comments)    Hallucinations    Quetiapine Other (See Comments)    Pt states that this medication is too strong.    Shellfish Allergy Anaphylaxis   Phenergan [Promethazine Hcl] Other (See  Comments)    Chest pain     Prilosec [Omeprazole] Nausea And Vomiting   Sulfa Antibiotics Other (See Comments)    Chest pain    Tegretol [Carbamazepine] Nausea And Vomiting   Prozac [Fluoxetine] Other (See Comments)    Increased Depression , Suicidal thoughts   Tape Other (See Comments)    Skin tears, can only tolerate paper tape.   Tylenol [Acetaminophen]     Unspecified reaction     Home Medications  Prior to Admission medications   Medication Sig Start Date End Date Taking? Authorizing Provider  acetaminophen (TYLENOL) 325 MG tablet Take 2 tablets (650 mg total) by mouth every 6 (six) hours as needed. 05/30/21  Yes Wynona Dove A, DO  albuterol (VENTOLIN HFA) 108 (90 Base) MCG/ACT inhaler Inhale 2 puffs into the lungs every 6 (six) hours as needed for wheezing or shortness of breath.   Yes [provider]  benzocaine (ORAJEL) 10 % mucosal gel Use as directed 1 application. in the mouth or throat daily as needed for mouth pain.   Yes [provider]  budesonide-formoterol (SYMBICORT) 160-4.5 MCG/ACT inhaler Inhale 2 puffs into the lungs 2 (two) times daily.   Yes [provider]  calcium carbonate (TUMS - DOSED IN MG ELEMENTAL CALCIUM) 500 MG chewable tablet Chew 1 tablet by mouth daily as needed for indigestion or heartburn.   Yes [provider]  cariprazine (VRAYLAR) 3 MG capsule Take 3 mg by mouth daily.   Yes [provider]  diclofenac Sodium (VOLTAREN) 1 % GEL Apply 2 g topically 4 (four) times daily. 06/23/21  Yes Harris, Abigail, PA-C  escitalopram (LEXAPRO) 10 MG tablet Take 10 mg by mouth daily.   Yes [provider]  gabapentin (NEURONTIN) 100 MG capsule Take 2 capsules (200 mg total) by mouth 3 (three) times daily. 04/26/21 07/01/21 Yes Massengill, Ovid Curd, MD  hydrOXYzine (ATARAX) 10 MG tablet Take 10 mg by mouth 3 (three) times daily as needed for anxiety.   Yes [provider]  ibuprofen (ADVIL) 200 MG tablet Take  400-600 mg by mouth every 6 (six) hours as needed for moderate pain or headache.   Yes [provider]  lidocaine (LIDODERM) 5 % Place 1 patch onto the skin daily as needed. Remove & Discard patch within 12 hours  or as directed by MD Patient taking differently: Place 1 patch onto the skin daily as needed (pain). Remove & Discard patch within 12 hours or as directed by MD 05/30/21  Yes Wynona Dove A, DO  melatonin 3 MG TABS tablet Take 3 mg by mouth at bedtime.   Yes [provider]  ondansetron (ZOFRAN) 4 MG tablet Take 4 mg by mouth every 8 (eight) hours as needed for nausea or vomiting.   Yes [provider]  pantoprazole (PROTONIX) 40 MG tablet TAKE 1 TABLET BY MOUTH DAILY Patient taking differently: 40 mg daily. 04/26/21  Yes Jeanie Sewer, NP  propranolol (INDERAL) 10 MG tablet Take 10 mg by mouth 2 (two) times daily.   Yes [provider]     This patient is critically ill with multiple organ system failure; which, requires frequent high complexity decision making, assessment, support, evaluation, and titration of therapies. This was completed through the application of advanced monitoring technologies and extensive interpretation of multiple databases. During this encounter critical care time was devoted to patient care services described in this note for 34 minutes.   Garner Nash, DO [facility] 07/02/2021 7:59 AM

## 2021-07-02 NOTE — Progress Notes (Signed)
Patient began having frequent PVC's. After about 2 minutes patient went back to NSR without ectopy. Magnesium had been given, epinephrine had been weaned off. Vital signs otherwise stable.

## 2021-07-02 NOTE — Progress Notes (Signed)
Spoke with Almyra Free at [place] control center. Updated on patient condition, lab results, EKG and vital signs.  [facility] recommendation is to keep Mg and K+ at low end of normal.

## 2021-07-02 NOTE — Progress Notes (Signed)
Patient had 6 runs of non-sustained V-Tach within a few minutes, each episode lasting between 10-20 beats. Patient states she had a fluttery sensation in her chest when rhythm changed. MD made aware. Orders received. EKG done, labs and 2g IV Magnesium ordered.

## 2021-07-03 DIAGNOSIS — T447X2A Poisoning by beta-adrenoreceptor antagonists, intentional self-harm, initial encounter: Secondary | ICD-10-CM | POA: Diagnosis not present

## 2021-07-03 DIAGNOSIS — X838XXA Intentional self-harm by other specified means, initial encounter: Secondary | ICD-10-CM | POA: Diagnosis not present

## 2021-07-03 LAB — BASIC METABOLIC PANEL
Anion gap: 4 — ABNORMAL LOW (ref 5–15)
Anion gap: 3 — ABNORMAL LOW (ref 5–15)
Anion gap: 4 — ABNORMAL LOW (ref 5–15)
Anion gap: 6 (ref 5–15)
BUN: <5 mg/dL — ABNORMAL LOW (ref 6–20)
BUN: <5 mg/dL — ABNORMAL LOW (ref 6–20)
BUN: <5 mg/dL — ABNORMAL LOW (ref 6–20)
BUN: 5 mg/dL — ABNORMAL LOW (ref 6–20)
CO2: 21 mmol/L — ABNORMAL LOW (ref 22–32)
CO2: 22 mmol/L (ref 22–32)
CO2: 22 mmol/L (ref 22–32)
CO2: 22 mmol/L (ref 22–32)
Calcium: 8.1 mg/dL — ABNORMAL LOW (ref 8.9–10.3)
Calcium: 8.5 mg/dL — ABNORMAL LOW (ref 8.9–10.3)
Calcium: 8.3 mg/dL — ABNORMAL LOW (ref 8.9–10.3)
Calcium: 8.4 mg/dL — ABNORMAL LOW (ref 8.9–10.3)
Chloride: 110 mmol/L (ref 98–111)
Chloride: 114 mmol/L — ABNORMAL HIGH (ref 98–111)
Chloride: 112 mmol/L — ABNORMAL HIGH (ref 98–111)
Chloride: 111 mmol/L (ref 98–111)
Creatinine, Ser: 0.58 mg/dL (ref 0.44–1.00)
Creatinine, Ser: 0.57 mg/dL (ref 0.44–1.00)
Creatinine, Ser: 0.7 mg/dL (ref 0.44–1.00)
Creatinine, Ser: 0.74 mg/dL (ref 0.44–1.00)
GFR, Estimated: >60 mL/min (ref >60)
GFR, Estimated: >60 mL/min (ref >60)
GFR, Estimated: >60 mL/min (ref >60)
GFR, Estimated: >60 mL/min (ref >60)
Glucose, Bld: 175 mg/dL — ABNORMAL HIGH (ref 70–99)
Glucose, Bld: 54 mg/dL — ABNORMAL LOW (ref 70–99)
Glucose, Bld: 116 mg/dL — ABNORMAL HIGH (ref 70–99)
Glucose, Bld: 144 mg/dL — ABNORMAL HIGH (ref 70–99)
Potassium: 3.2 mmol/L — ABNORMAL LOW (ref 3.5–5.1)
Potassium: 4.1 mmol/L (ref 3.5–5.1)
Potassium: 4.5 mmol/L (ref 3.5–5.1)
Potassium: 4 mmol/L (ref 3.5–5.1)
Sodium: 135 mmol/L (ref 135–145)
Sodium: 139 mmol/L (ref 135–145)
Sodium: 138 mmol/L (ref 135–145)
Sodium: 139 mmol/L (ref 135–145)

## 2021-07-03 LAB — GLUCOSE, CAPILLARY
Glucose-Capillary: 104 mg/dL — ABNORMAL HIGH (ref 70–99)
Glucose-Capillary: 108 mg/dL — ABNORMAL HIGH (ref 70–99)
Glucose-Capillary: 126 mg/dL — ABNORMAL HIGH (ref 70–99)
Glucose-Capillary: 130 mg/dL — ABNORMAL HIGH (ref 70–99)
Glucose-Capillary: 130 mg/dL — ABNORMAL HIGH (ref 70–99)
Glucose-Capillary: 134 mg/dL — ABNORMAL HIGH (ref 70–99)
Glucose-Capillary: 137 mg/dL — ABNORMAL HIGH (ref 70–99)
Glucose-Capillary: 142 mg/dL — ABNORMAL HIGH (ref 70–99)
Glucose-Capillary: 143 mg/dL — ABNORMAL HIGH (ref 70–99)
Glucose-Capillary: 147 mg/dL — ABNORMAL HIGH (ref 70–99)
Glucose-Capillary: 65 mg/dL — ABNORMAL LOW (ref 70–99)

## 2021-07-03 LAB — COMPREHENSIVE METABOLIC PANEL
ALT: 12 U/L (ref 0–44)
AST: 14 U/L — ABNORMAL LOW (ref 15–41)
Albumin: 2.7 g/dL — ABNORMAL LOW (ref 3.5–5.0)
Alkaline Phosphatase: 43 U/L (ref 38–126)
Anion gap: 4 — ABNORMAL LOW (ref 5–15)
BUN: <5 mg/dL — ABNORMAL LOW (ref 6–20)
CO2: 20 mmol/L — ABNORMAL LOW (ref 22–32)
Calcium: 7.3 mg/dL — ABNORMAL LOW (ref 8.9–10.3)
Chloride: 104 mmol/L (ref 98–111)
Creatinine, Ser: 0.64 mg/dL (ref 0.44–1.00)
GFR, Estimated: >60 mL/min (ref >60)
Potassium: 3.1 mmol/L — ABNORMAL LOW (ref 3.5–5.1)
Sodium: 128 mmol/L — ABNORMAL LOW (ref 135–145)
Total Bilirubin: 0.2 mg/dL — ABNORMAL LOW (ref 0.3–1.2)
Total Protein: 5 g/dL — ABNORMAL LOW (ref 6.5–8.1)

## 2021-07-03 LAB — MAGNESIUM: Magnesium: 1.7 mg/dL (ref 1.7–2.4)

## 2021-07-03 MED ORDER — POTASSIUM CHLORIDE 10 MEQ/50ML IV SOLN
10.0000 meq | INTRAVENOUS | Status: AC
  Administered 2021-07-03 (×4): 10 meq via INTRAVENOUS
  Filled 2021-07-03 (×6): qty 50

## 2021-07-03 MED ORDER — MAGNESIUM SULFATE 2 GM/50ML IV SOLN
2.0000 g | Freq: Once | INTRAVENOUS | Status: AC
  Administered 2021-07-03: 2 g via INTRAVENOUS
  Filled 2021-07-03: qty 50

## 2021-07-03 MED ORDER — DEXTROSE 10 % IV SOLN: INTRAVENOUS | Status: DC

## 2021-07-03 MED ORDER — DICLOFENAC SODIUM 1 % EX GEL
2.0000 g | Freq: Four times a day (QID) | CUTANEOUS | Status: DC | PRN
  Administered 2021-07-03 – 2021-07-04 (×4): 2 g via TOPICAL
  Filled 2021-07-03: qty 100

## 2021-07-03 MED ORDER — DIAZEPAM 5 MG/ML IJ SOLN
5.0000 mg | INTRAMUSCULAR | Status: DC | PRN
Start: 2021-07-03 — End: 2021-07-04

## 2021-07-03 MED ORDER — POTASSIUM CHLORIDE 10 MEQ/50ML IV SOLN
10.0000 meq | INTRAVENOUS | Status: AC
  Administered 2021-07-03 (×2): 10 meq via INTRAVENOUS

## 2021-07-03 MED ORDER — LORAZEPAM 2 MG/ML IJ SOLN: 1.0000 mg | Freq: Four times a day (QID) | INTRAMUSCULAR | Status: DC | PRN

## 2021-07-03 MED ORDER — IBUPROFEN 200 MG PO TABS
400.0000 mg | ORAL_TABLET | Freq: Four times a day (QID) | ORAL | Status: DC | PRN
  Administered 2021-07-03: 400 mg via ORAL
  Filled 2021-07-03: qty 2

## 2021-07-03 NOTE — Progress Notes (Signed)
[facility] ADULT ICU REPLACEMENT PROTOCOL   The patient does apply for the [facility] Adult ICU Electrolyte Replacment Protocol based on the criteria listed below:   1.Exclusion criteria: TCTS patients, ECMO patients, and Dialysis patients 2. Is GFR >/= 30 ml/min? Yes.    Patient's GFR today is > 60 3. Is SCr </= 2? Yes.   Patient's SCr is 0.64 mg/dL 4. Did SCr increase >/= 0.5 in 24 hours? No. 5.Pt's weight >40kg  Yes.   6. Abnormal electrolyte(s): K+ 3.1  7. Electrolytes replaced per protocol 8.  Call MD STAT for K+ </= 2.5, Phos </= 1, or Mag </= 1 Physician:  Dr Kandis Nab, Elfredia Nevins 07/03/2021 2:38 AM

## 2021-07-03 NOTE — Progress Notes (Signed)
NAME:  [name], MRN:  [id number], DOB:  1990-02-12, LOS: 2 ADMISSION DATE:  07/01/2021, CONSULTATION DATE:  07/01/21 REFERRING MD:  Doren Custard, CHIEF COMPLAINT:  SI   History of Present Illness:  31 year old woman with hx of myriad of psychiatric disorders and prior suicide attempts presenting after intentional ingestion of 262m of propranolol (2 weeks of her home pills) in attempt to kill herself.  Points to life stressors including family as preceding events.  Has been persistently bradycardic in ER despite fluids, glucagon and calcium so started on high insulin euglycemia protocol for beta blocker toxicity.  PCCM asked to admit.  Also seems to have overdosed on Gabapentin, protonix, Vraylar, lexapro  Pertinent  Medical History   Past Medical History:  Diagnosis Date   Acid reflux    Anxiety    Asthma    last attack 03/13/15 or 03/14/15   Autism    Carrier of fragile X syndrome    Chronic constipation    Depression    Drug-seeking behavior    Essential tremor    Headache    Overdose of acetaminophen 07/2017   and other meds   Personality disorder ([place]    Schizo-affective psychosis ([facility] [place]    Schizoaffective disorder, bipolar type ([facility]    Seizures ([place]    Last seizure December 2017   Sleep apnea      Significant Hospital Events: Including procedures, antibiotic start and stop dates in addition to other pertinent events   07/01/21  Interim History / Subjective:   Remains on high-dose IV insulin and D10.  As well as low-dose Neo-Synephrine infusion to maintain blood pressure Objective   Blood pressure 103/80, pulse 73, temperature 98.1 F (36.7 C), temperature source Oral, resp. rate 16, height 5' 6" (1.676 m), weight 93.4 kg, SpO2 96 %.        Intake/Output Summary (Last 24 hours) at 07/03/2021 0718 Last data filed at 07/03/2021 0700 Gross per 24 hour  Intake 11765.71 ml  Output 7570 ml  Net 4195.71 ml   Filed Weights   07/01/21 1057 07/02/21 0500   Weight: 92.5 kg 93.4 kg    Examination: General: Young female resting in bed much more conversant today alert HENT: Mucous membranes moist, trachea midline, tracking appropriately Lungs: Clear to auscultation bilaterally no crackles no wheeze Cardiovascular: Regular rate rhythm, S1-S2 Abdomen: Soft, nontender nondistended Extremities: No significant edema Neuro: Moves all 4 extremities Psych: Alert and oriented  Labs reviewed Potassium 3.2  Resolved Hospital Problem list   N/A  Assessment & Plan:   Intentional propranolol overdose- EKG okay, no role for bicarb at present.  Hemodynamic effects will hopefully be done fairly quick.  History of several of these events. Plan: I think we can stop her high-dose insulin therapy today and D10 infusion Continue to check CBGs Heart rate is much improved in the 80s now. Off of epinephrine infusion, this did stimulate a few episodes of nonsustained V. tach. Ty to titrate off of Neo-Synephrine. Once she is off any infusions today I think she probably be stable to move to [facility] for pickup tomorrow.  Schizoaffective disorder, depression, autism Plan: Holding home meds  Constipation Plan: Lactulose was given with result  Knee pain Voltaren gel  Best Practice (right click and "Reselect all SmartList Selections" daily)   Diet/type: Regular consistency (see orders) DVT prophylaxis: not indicated GI prophylaxis: PPI Lines: PICC ordered  Foley:  N/A Code Status:  full code Last date of multidisciplinary goals of   NAME:  [name], MRN:  [id number], DOB:  05/22/1990, LOS: 2 ADMISSION DATE:  07/01/2021, CONSULTATION DATE:  07/01/21 REFERRING MD:  Dixon, CHIEF COMPLAINT:  SI   History of Present Illness:  30 year old woman with hx of myriad of psychiatric disorders and prior suicide attempts presenting after intentional ingestion of 280mg of propranolol (2 weeks of her home pills) in attempt to kill herself.  Points to life stressors including family as preceding events.  Has been persistently bradycardic in ER despite fluids, glucagon and calcium so started on high insulin euglycemia protocol for beta blocker toxicity.  PCCM asked to admit.  Also seems to have overdosed on Gabapentin, protonix, Vraylar, lexapro  Pertinent  Medical History   Past Medical History:  Diagnosis Date   Acid reflux    Anxiety    Asthma    last attack 03/13/15 or 03/14/15   Autism    Carrier of fragile X syndrome    Chronic constipation    Depression    Drug-seeking behavior    Essential tremor    Headache    Overdose of acetaminophen 07/2017   and other meds   Personality disorder (H[facility])    Schizo-affective psychosis (HCC)    Schizoaffective disorder, bipolar type (H[facility])    Seizures (H[facility])    Last seizure December 2017   Sleep apnea      Significant Hospital Events: Including procedures, antibiotic start and stop dates in addition to other pertinent events   07/01/21  Interim History / Subjective:   Remains on high-dose IV insulin and D10.  As well as low-dose Neo-Synephrine infusion to maintain blood pressure Objective   Blood pressure 103/80, pulse 73, temperature 98.1 F (36.7 C), temperature source Oral, resp. rate 16, height 5' 6" (1.676 m), weight 93.4 kg, SpO2 96 %.        Intake/Output Summary (Last 24 hours) at 07/03/2021 0718 Last data filed at 07/03/2021 0700 Gross per 24 hour  Intake 11765.71 ml  Output 7570 ml  Net 4195.71 ml   Filed Weights   07/01/21 1057 07/02/21 0500   Weight: 92.5 kg 93.4 kg    Examination: General: Young female resting in bed much more conversant today alert HENT: Mucous membranes moist, trachea midline, tracking appropriately Lungs: Clear to auscultation bilaterally no crackles no wheeze Cardiovascular: Regular rate rhythm, S1-S2 Abdomen: Soft, nontender nondistended Extremities: No significant edema Neuro: Moves all 4 extremities Psych: Alert and oriented  Labs reviewed Potassium 3.2  Resolved Hospital Problem list   N/A  Assessment & Plan:   Intentional propranolol overdose- EKG okay, no role for bicarb at present.  Hemodynamic effects will hopefully be done fairly quick.  History of several of these events. Plan: I think we can stop her high-dose insulin therapy today and D10 infusion Continue to check CBGs Heart rate is much improved in the 80s now. Off of epinephrine infusion, this did stimulate a few episodes of nonsustained V. tach. Ty to titrate off of Neo-Synephrine. Once she is off any infusions today I think she probably be stable to move to [facility] for pickup tomorrow.  Schizoaffective disorder, depression, autism Plan: Holding home meds  Constipation Plan: Lactulose was given with result  Knee pain Voltaren gel  Best Practice (right click and "Reselect all SmartList Selections" daily)   Diet/type: Regular consistency (see orders) DVT prophylaxis: not indicated GI prophylaxis: PPI Lines: PICC ordered  Foley:  N/A Code Status:  full code Last date of multidisciplinary goals of   NAME:  [name], MRN:  [id number], DOB:  1990-02-12, LOS: 2 ADMISSION DATE:  07/01/2021, CONSULTATION DATE:  07/01/21 REFERRING MD:  Doren Custard, CHIEF COMPLAINT:  SI   History of Present Illness:  31 year old woman with hx of myriad of psychiatric disorders and prior suicide attempts presenting after intentional ingestion of 262m of propranolol (2 weeks of her home pills) in attempt to kill herself.  Points to life stressors including family as preceding events.  Has been persistently bradycardic in ER despite fluids, glucagon and calcium so started on high insulin euglycemia protocol for beta blocker toxicity.  PCCM asked to admit.  Also seems to have overdosed on Gabapentin, protonix, Vraylar, lexapro  Pertinent  Medical History   Past Medical History:  Diagnosis Date   Acid reflux    Anxiety    Asthma    last attack 03/13/15 or 03/14/15   Autism    Carrier of fragile X syndrome    Chronic constipation    Depression    Drug-seeking behavior    Essential tremor    Headache    Overdose of acetaminophen 07/2017   and other meds   Personality disorder ([place]    Schizo-affective psychosis ([facility]    Schizoaffective disorder, bipolar type ([place]    Seizures ([place]    Last seizure December 2017   Sleep apnea      Significant Hospital Events: Including procedures, antibiotic start and stop dates in addition to other pertinent events   07/01/21  Interim History / Subjective:   Remains on high-dose IV insulin and D10.  As well as low-dose Neo-Synephrine infusion to maintain blood pressure Objective   Blood pressure 103/80, pulse 73, temperature 98.1 F (36.7 C), temperature source Oral, resp. rate 16, height 5' 6" (1.676 m), weight 93.4 kg, SpO2 96 %.        Intake/Output Summary (Last 24 hours) at 07/03/2021 0718 Last data filed at 07/03/2021 0700 Gross per 24 hour  Intake 11765.71 ml  Output 7570 ml  Net 4195.71 ml   Filed Weights   07/01/21 1057 07/02/21 0500   Weight: 92.5 kg 93.4 kg    Examination: General: Young female resting in bed much more conversant today alert HENT: Mucous membranes moist, trachea midline, tracking appropriately Lungs: Clear to auscultation bilaterally no crackles no wheeze Cardiovascular: Regular rate rhythm, S1-S2 Abdomen: Soft, nontender nondistended Extremities: No significant edema Neuro: Moves all 4 extremities Psych: Alert and oriented  Labs reviewed Potassium 3.2  Resolved Hospital Problem list   N/A  Assessment & Plan:   Intentional propranolol overdose- EKG okay, no role for bicarb at present.  Hemodynamic effects will hopefully be done fairly quick.  History of several of these events. Plan: I think we can stop her high-dose insulin therapy today and D10 infusion Continue to check CBGs Heart rate is much improved in the 80s now. Off of epinephrine infusion, this did stimulate a few episodes of nonsustained V. tach. Ty to titrate off of Neo-Synephrine. Once she is off any infusions today I think she probably be stable to move to [facility] for pickup tomorrow.  Schizoaffective disorder, depression, autism Plan: Holding home meds  Constipation Plan: Lactulose was given with result  Knee pain Voltaren gel  Best Practice (right click and "Reselect all SmartList Selections" daily)   Diet/type: Regular consistency (see orders) DVT prophylaxis: not indicated GI prophylaxis: PPI Lines: PICC ordered  Foley:  N/A Code Status:  full code Last date of multidisciplinary goals of

## 2021-07-03 NOTE — Progress Notes (Signed)
Approached patient's room as the patient was crying and wanting to call and talk to her therapist, Butch Penny. The patient expressed, "I want my cell phone so I can talk to my therapist and you cannot keep me here because I have done nothing to nobody. I want to speak to a therapist, counselor, chaplain." It was verbalized to the patient that it is a holiday and there are not on-call therapists available while she is in the hospital. It was also verbalized that I would gladly consult the chaplain and the patient was reminded the psychiatry team had stopped by for a visit earlier. Patient wants to leave to pay bills, however currently under IVC due to overdose. Patient admits to her actions but still wants to leave. Psych NP notified of patient's escalating behaviors and treatment approaches.

## 2021-07-03 NOTE — Progress Notes (Signed)
Chaplain met briefly with patient. Kennon Holter is also working with her. Patient crying uncontrollably about missing her daughter "[name]" who is 33 years old.  She says that she lost custody of Sophia and that she's getting bullied online by her dead husband's family.  She says she is autistic and has depression.  And that Sophia's father died by suicide had schizophrenia.  She is also exhibiting signs of paranoia.  She says she can't say what the hospitial has done to her but she is upset her phone has been taken away.  Chaplain offered presence and prayer. She asked for a Bible.  Chaplain said that she would refer her situation to the day time chaplain for follow-up. Rev. Tamsen Snider  Pager [phone]

## 2021-07-03 NOTE — Progress Notes (Signed)
Pt is crying uncontrollably- stating that  " I want to hold my daughter, that's all I want"-  She removed her heart monitor leads and refuses to allow staff to replace them. She is also refusing to have and EKG and blood sugar checked. She also is requesting to see the chaplin. Mable Fill was paged and is coming to see patient. I paged [facility] on call with the information about her heart monitor. We will continue to try to replace the heart monitor and obtain the scheduled test and monitor this patient closely.

## 2021-07-03 NOTE — Progress Notes (Signed)
Pharmacy Electrolyte Replacement  Recent Labs:  Recent Labs    07/03/21 0222  K 3.2*  MG 1.7  CREATININE 0.58    Low Critical Values (K </= 2.5, Phos </= 1, Mg </= 1) Present: None  MD Contacted: n/a  Plan: K replaced by Elink. Mag 2g IV per protocol.  Follow-up in AM.   Sloan Leiter, PharmD, BCPS, BCCCP Clinical Pharmacist Please refer to [facility] for Horace numbers 07/03/2021, 7:37 AM

## 2021-07-03 NOTE — Consult Note (Signed)
[facility] Face-to-Face Psychiatry Consult   Reason for Consult:  Suicidal Ideations  Referring Physician:  EPD Patient Identification: [name] MRN:  [id number] Principal Diagnosis: Suicide [facility]) Diagnosis:  Principal Problem:   Suicide ([place])   Total Time spent with patient: 1 hour  Subjective:   [name] is a 31 y.o. female presents with chronic suicidal ideation. Patient is diagnosed with Schizoaffective, borderline personality disorder and major depressive disorder who presents for suicide attempt by overdose. [name] is well know to the Psychiatric Consult service for her frequent admissions, ER visits and chronic suicidal ideations. When assessing her reason for presenting to the hospital she states " I did something I will never do again. I got a damn PICC line a central catheter in my arm. I aint ever been that sick or attempted suicide that I was this sick. I felt so bad, I will never do it again. I learned my lesson to this time. " She states she also attempted to overdose last week on (5) propranolol tablets " that was a little attempt. I didn't do much that time and I didn't tell anyone either. " She states her stressors was "seeing her daughter on Carolee Rota, daughters family member on facetime. Im trying to fight for her and they telling me to let it go. " Patient reports she lost custody of her daughter 5 years ago and she has been trying to get her back. She endorses struggling with hospitalizations, work, physical pain that limits her ability to remain stable and get her daughter back. She reports she is working at job, and needs to discharge from the hospital so she can pay her bills on the first. She reports not wanting to have her lights and water cut off.  Currently she is denying suicidal or homicidal ideations.  Denies auditory or visual hallucinations.  Reports receiving services under [facility] team in [place]. Case was staffed with Attending  Psychiatrist Cinderella. Support, encouragement and reassurance was provided for patient who is declining inpatient psychiatric admission. She is encouraged to work on her affairs and use her support system while she is in the medical hospital (I.e pay bills over the phone, contact company for extension, have boyfriend pay).   Patient is seen, chart reviewed.  Patient is alert and oriented x4, calm and cooperative, very pleasant.  Patient presents with a blunt affect, and her mood is congruent.  Her thought process is logical, speech is clear and coherent.  Patient is very forthcoming about her suicide attempt of high lethality, and shows much remorse for her attempt.  She also expresses much interest in wanting to discharge home so that she can pay her bills for the month of June.  Discussed with patient her need for inpatient hospitalization due to suicide attempt of high lethality, resulting in medical admission to intensive care unit and life-sustaining measures.  Patient remains high risk for suicide completion as evident by most recent suicide attempt last week, which was confirmed by chart review.  Patient overdosed on 5 propanolol pills, 3 Vraylar, 2 Lexapro, and 4 gabapentin pills in which she states " was attention seeking tried to prove a point."  Patient was also told during that hospital visit, if she presents to the hospital 1 more time she will be switched to ACT team.  That does appear to be ultimate goal. Patient currently denies suicidal ideations and homicidla ideations. SHe denies audiotry and visual hallucinations. She does not appear to be responding  to internal stimuli, external stimuli and or displaying delusional thought disorder. Patient currently meets inpatient psychiatric criteria.   HPI: 31 year old woman with hx of myriad of psychiatric disorders and prior suicide attempts presenting after intentional ingestion of 2105m of propranolol (2 weeks of her home pills) in attempt to kill  herself.  Points to life stressors including family as preceding events.  Has been persistently bradycardic in ER despite fluids, glucagon and calcium so started on high insulin euglycemia protocol for beta blocker toxicity.  PCCM asked to admit.  Past Psychiatric History: past psychiatric history significant for autism, anxiety, depression, and schizoaffective disorder (bipolar type), and borderline personality.  [facility] and ER visits.  Suicide attempts "tto many to count about 20 x, they been so simple. "   Risk to Self:  Denies currently, although she is here for suicide attempt Risk to Others:  Denies Prior Inpatient Therapy:  multiple hospitalizations. 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 fragile X syndrome    Chronic constipation    Depression    Drug-seeking behavior    Essential tremor    Headache    Overdose of acetaminophen 07/2017   and other meds   [place]ality disorder ([place]    Sch[place]affective psychosis ([facility]    Schizoaffective disorder, bipolar type ([facility]    Seizures ([facility]    Last seizure December 2017   Sleep apnea     Past Surgical History:  Procedure Laterality Date   MOUTH SURGERY  2009 or 2010   Family History:  Family History  Problem Relation Age of Onset   Mental illness Father    Asthma Father    PDD Brother    Seizures Brother    Family Psychiatric  History:  Social History:  Social History   Substance and Sexual Activity  Alcohol Use No   Alcohol/week: 1.0 standard drink   Types: 1 Standard drinks or equivalent per week   Comment: denies at this time     Social History   Substance and Sexual Activity  Drug Use No   Comment: History of cocaine use at age [age]for 4 months    Social History    Socioeconomic History   Marital status: Widowed    Spouse name: Not on file   Number of children: 0   Years of education: Not on file   Highest education level: Not on file  Occupational History   Occupation: disability  Tobacco Use   Smoking status: Former    Packs/day: 0.00    Types: Cigarettes   Smokeless tobacco: Never   Tobacco comments:    Smoked for 2  years age [age] Vaping Use   Vaping Use: Never used  Substance and Sexual Activity   Alcohol use: No    Alcohol/week: 1.0 standard drink    Types: 1 Standard drinks or equivalent per week    Comment: denies at this time   Drug use: No    Comment: History of cocaine use at age [age]for 4 months   Sexual activity: Not Currently    Birth control/protection: None  Other Topics Concern   Not on file  Social History Narrative   Marital status: Widowed  Children: daughter      Lives: with boyfriend, in two story home      Employment:  Disability      Tobacco: quit smoking; smoked for two years.      Alcohol ;none      Drugs: none   Has not traveled outside of the country.   Right handed         Social Determinants of Health   Financial Resource Strain: Not on file  Food Insecurity: Not on file  Transportation Needs: Not on file  Physical Activity: Not on file  Stress: Not on file  Social Connections: Not on file   Additional Social History:    Allergies:   Allergies  Allergen Reactions   Bee Venom Anaphylaxis   Coconut Flavor Anaphylaxis and Rash   Geodon [Ziprasidone Hcl] Other (See Comments)    Pt states that this medication causes paralysis of the mouth.     Haloperidol And Related Other (See Comments)    Pt states that this medication causes paralysis of the mouth, jaw locks up   Lithium Other (See Comments)    Seizure-like activity    Oxycodone Other (See Comments)    Hallucinations    Quetiapine Other (See Comments)    Pt states that this medication is too strong.    Shellfish Allergy  Anaphylaxis   Phenergan [Promethazine Hcl] Other (See Comments)    Chest pain     Prilosec [Omeprazole] Nausea And Vomiting   Sulfa Antibiotics Other (See Comments)    Chest pain    Tegretol [Carbamazepine] Nausea And Vomiting   Prozac [Fluoxetine] Other (See Comments)    Increased Depression , Suicidal thoughts   Tape Other (See Comments)    Skin tears, can only tolerate paper tape.   Tylenol [Acetaminophen]     Unspecified reaction    Labs:  Results for orders placed or performed during the hospital encounter of 07/01/21 (from the past 48 hour(s))  I-Stat beta hCG blood, ED     Status: None   Collection Time: 07/01/21 12:53 PM  Result Value Ref Range   I-stat hCG, quantitative <5.0 <5 mIU/mL   Comment 3            Comment:   GEST. AGE      CONC.  (mIU/mL)   <=1 WEEK        5 - 50     2 WEEKS       50 - 500     3 WEEKS       100 - 10,000     4 WEEKS     1,000 - 30,000        FEMALE AND NON-PREGNANT FEMALE:     LESS THAN 5 mIU/mL   CBG monitoring, ED     Status: Abnormal   Collection Time: 07/01/21  4:08 PM  Result Value Ref Range   Glucose-Capillary 187 (H) 70 - 99 mg/dL    Comment: Glucose reference range applies only to samples taken after fasting for at least 8 hours.  POC CBG, ED     Status: Abnormal   Collection Time: 07/01/21  4:32 PM  Result Value Ref Range   Glucose-Capillary 494 (H) 70 - 99 mg/dL    Comment: Glucose reference range applies only to samples taken after fasting for at least 8 hours.  MRSA Next Gen by PCR, Nasal     Status: None   Collection Time: 07/01/21  6:12 PM   Specimen: Nasal  Mucosa; Nasal Swab  Result Value Ref Range   MRSA by PCR Next Gen NOT DETECTED NOT DETECTED    Comment: (NOTE) The GeneXpert MRSA Assay (FDA approved for NASAL specimens only), is one component of a comprehensive MRSA colonization surveillance program. It is not intended to diagnose MRSA infection nor to guide or monitor treatment for MRSA infections. Test  performance is not FDA approved in patients less than 74 years old. Performed at [facility], [place] [address] [place], [place] [address]   Glucose, capillary     Status: Abnormal   Collection Time: 07/01/21  6:19 PM  Result Value Ref Range   Glucose-Capillary 106 (H) 70 - 99 mg/dL    Comment: Glucose reference range applies only to samples taken after fasting for at least 8 hours.  Acetaminophen level     Status: Abnormal   Collection Time: 07/01/21  6:32 PM  Result Value Ref Range   Acetaminophen (Tylenol), Serum <10 (L) 10 - 30 ug/mL    Comment: (NOTE) Therapeutic concentrations vary significantly. A range of 10-30 ug/mL  may be an effective concentration for many patients. However, some  are best treated at concentrations outside of this range. Acetaminophen concentrations >150 ug/mL at 4 hours after ingestion  and >50 ug/mL at 12 hours after ingestion are often associated with  toxic reactions.  Performed at [facility], [place] [address], [place], [place] [address]   HIV Antibody (routine testing w rflx)     Status: None   Collection Time: 07/01/21  6:32 PM  Result Value Ref Range   HIV Screen 4th Generation wRfx Non Reactive Non Reactive    Comment: Performed at [facility], [place] [address] [place], [place] [address]  Basic metabolic panel     Status: Abnormal   Collection Time: 07/01/21  6:32 PM  Result Value Ref Range   Sodium 138 135 - 145 mmol/L   Potassium 3.1 (L) 3.5 - 5.1 mmol/L   Chloride 112 (H) 98 - 111 mmol/L   CO2 21 (L) 22 - 32 mmol/L   Glucose, Bld 91 70 - 99 mg/dL    Comment: Glucose reference range applies only to samples taken after fasting for at least 8 hours.   BUN 7 6 - 20 mg/dL   Creatinine, Ser 0.70 0.44 - 1.00 mg/dL   Calcium 8.5 (L) 8.9 - 10.3 mg/dL   GFR, Estimated >60 >60 mL/min    Comment: (NOTE) Calculated using the CKD-EPI Creatinine Equation (2021)    Anion gap 5 5 - 15    Comment: Performed at [facility] [address], [place], [place] [address]  Glucose, capillary     Status: Abnormal   Collection Time: 07/01/21  7:23 PM  Result Value Ref Range   Glucose-Capillary 109 (H) 70 - 99 mg/dL    Comment: Glucose reference range applies only to samples taken after fasting for at least 8 hours.   Comment 1 Notify RN    Comment 2 Document in Chart   Glucose, capillary     Status: Abnormal   Collection Time: 07/01/21  8:23 PM  Result Value Ref Range   Glucose-Capillary 133 (H) 70 - 99 mg/dL    Comment: Glucose reference range applies only to samples taken after fasting for at least 8 hours.   Comment 1 Notify RN    Comment 2 Document in Chart   Glucose, capillary     Status: Abnormal  Collection Time: 07/01/21  9:26 PM  Result Value Ref Range   Glucose-Capillary 125 (H) 70 - 99 mg/dL    Comment: Glucose reference range applies only to samples taken after fasting for at least 8 hours.   Comment 1 Notify RN    Comment 2 Document in Chart   Glucose, capillary     Status: Abnormal   Collection Time: 07/01/21 10:26 PM  Result Value Ref Range   Glucose-Capillary 152 (H) 70 - 99 mg/dL    Comment: Glucose reference range applies only to samples taken after fasting for at least 8 hours.  Glucose, capillary     Status: Abnormal   Collection Time: 07/01/21 11:33 PM  Result Value Ref Range   Glucose-Capillary 124 (H) 70 - 99 mg/dL    Comment: Glucose reference range applies only to samples taken after fasting for at least 8 hours.  Glucose, capillary     Status: Abnormal   Collection Time: 07/02/21 12:37 AM  Result Value Ref Range   Glucose-Capillary 142 (H) 70 - 99 mg/dL    Comment: Glucose reference range applies only to samples taken after fasting for at least 8 hours.   Comment 1 Notify RN    Comment 2 Document in Chart   Glucose, capillary     Status: Abnormal   Collection Time: 07/02/21  1:22 AM  Result Value Ref Range   Glucose-Capillary 198 (H) 70 - 99 mg/dL    Comment: Glucose  reference range applies only to samples taken after fasting for at least 8 hours.  Basic metabolic panel     Status: Abnormal   Collection Time: 07/02/21  1:38 AM  Result Value Ref Range   Sodium 138 135 - 145 mmol/L   Potassium 4.1 3.5 - 5.1 mmol/L    Comment: DELTA CHECK NOTED NO VISIBLE HEMOLYSIS    Chloride 113 (H) 98 - 111 mmol/L   CO2 20 (L) 22 - 32 mmol/L   Glucose, Bld 189 (H) 70 - 99 mg/dL    Comment: Glucose reference range applies only to samples taken after fasting for at least 8 hours.   BUN 5 (L) 6 - 20 mg/dL   Creatinine, Ser 0.70 0.44 - 1.00 mg/dL   Calcium 9.0 8.9 - 10.3 mg/dL   GFR, Estimated >60 >60 mL/min    Comment: (NOTE) Calculated using the CKD-EPI Creatinine Equation (2021)    Anion gap 5 5 - 15    Comment: Performed at [facility] [address], [place], [place] [address]  Glucose, capillary     Status: Abnormal   Collection Time: 07/02/21  2:33 AM  Result Value Ref Range   Glucose-Capillary 131 (H) 70 - 99 mg/dL    Comment: Glucose reference range applies only to samples taken after fasting for at least 8 hours.   Comment 1 Notify RN    Comment 2 Document in Chart   Glucose, capillary     Status: Abnormal   Collection Time: 07/02/21  3:39 AM  Result Value Ref Range   Glucose-Capillary 139 (H) 70 - 99 mg/dL    Comment: Glucose reference range applies only to samples taken after fasting for at least 8 hours.   Comment 1 Notify RN    Comment 2 Document in Chart   Glucose, capillary     Status: Abnormal   Collection Time: 07/02/21  4:36 AM  Result Value Ref Range   Glucose-Capillary 145 (H) 70 - 99 mg/dL    Comment:  Glucose reference range applies only to samples taken after fasting for at least 8 hours.   Comment 1 Notify RN    Comment 2 Document in Chart   Glucose, capillary     Status: Abnormal   Collection Time: 07/02/21  5:45 AM  Result Value Ref Range   Glucose-Capillary 149 (H) 70 - 99 mg/dL    Comment: Glucose reference range  applies only to samples taken after fasting for at least 8 hours.   Comment 1 Notify RN    Comment 2 Document in Chart   Glucose, capillary     Status: Abnormal   Collection Time: 07/02/21  6:40 AM  Result Value Ref Range   Glucose-Capillary 153 (H) 70 - 99 mg/dL    Comment: Glucose reference range applies only to samples taken after fasting for at least 8 hours.   Comment 1 Notify RN    Comment 2 Document in Chart   Glucose, capillary     Status: Abnormal   Collection Time: 07/02/21  7:31 AM  Result Value Ref Range   Glucose-Capillary 147 (H) 70 - 99 mg/dL    Comment: Glucose reference range applies only to samples taken after fasting for at least 8 hours.  Glucose, capillary     Status: Abnormal   Collection Time: 07/02/21  8:56 AM  Result Value Ref Range   Glucose-Capillary 154 (H) 70 - 99 mg/dL    Comment: Glucose reference range applies only to samples taken after fasting for at least 8 hours.  Basic metabolic panel     Status: Abnormal   Collection Time: 07/02/21  9:29 AM  Result Value Ref Range   Sodium 138 135 - 145 mmol/L   Potassium 4.2 3.5 - 5.1 mmol/L   Chloride 113 (H) 98 - 111 mmol/L   CO2 21 (L) 22 - 32 mmol/L   Glucose, Bld 105 (H) 70 - 99 mg/dL    Comment: Glucose reference range applies only to samples taken after fasting for at least 8 hours.   BUN <5 (L) 6 - 20 mg/dL   Creatinine, Ser 0.70 0.44 - 1.00 mg/dL   Calcium 8.9 8.9 - 10.3 mg/dL   GFR, Estimated >60 >60 mL/min    Comment: (NOTE) Calculated using the CKD-EPI Creatinine Equation (2021)    Anion gap 4 (L) 5 - 15    Comment: Performed at [facility] [address], [place], [place] [address]  Glucose, capillary     Status: Abnormal   Collection Time: 07/02/21  9:56 AM  Result Value Ref Range   Glucose-Capillary 143 (H) 70 - 99 mg/dL    Comment: Glucose reference range applies only to samples taken after fasting for at least 8 hours.  Glucose, capillary     Status: Abnormal   Collection  Time: 07/02/21 11:01 AM  Result Value Ref Range   Glucose-Capillary 174 (H) 70 - 99 mg/dL    Comment: Glucose reference range applies only to samples taken after fasting for at least 8 hours.  Glucose, capillary     Status: Abnormal   Collection Time: 07/02/21 12:05 PM  Result Value Ref Range   Glucose-Capillary 197 (H) 70 - 99 mg/dL    Comment: Glucose reference range applies only to samples taken after fasting for at least 8 hours.  Glucose, capillary     Status: Abnormal   Collection Time: 07/02/21  1:14 PM  Result Value Ref Range   Glucose-Capillary 195 (H) 70 - 99 mg/dL  Comment: Glucose reference range applies only to samples taken after fasting for at least 8 hours.  Glucose, capillary     Status: Abnormal   Collection Time: 07/02/21  2:03 PM  Result Value Ref Range   Glucose-Capillary 195 (H) 70 - 99 mg/dL    Comment: Glucose reference range applies only to samples taken after fasting for at least 8 hours.  Basic metabolic panel     Status: Abnormal   Collection Time: 07/02/21  2:30 PM  Result Value Ref Range   Sodium 137 135 - 145 mmol/L   Potassium 4.0 3.5 - 5.1 mmol/L   Chloride 112 (H) 98 - 111 mmol/L   CO2 21 (L) 22 - 32 mmol/L   Glucose, Bld 182 (H) 70 - 99 mg/dL    Comment: Glucose reference range applies only to samples taken after fasting for at least 8 hours.   BUN <5 (L) 6 - 20 mg/dL   Creatinine, Ser 0.75 0.44 - 1.00 mg/dL   Calcium 8.9 8.9 - 10.3 mg/dL   GFR, Estimated >60 >60 mL/min    Comment: (NOTE) Calculated using the CKD-EPI Creatinine Equation (2021)    Anion gap 4 (L) 5 - 15    Comment: Performed at [facility] [address], [place], [place] [address]  Magnesium     Status: Abnormal   Collection Time: 07/02/21  2:30 PM  Result Value Ref Range   Magnesium 1.5 (L) 1.7 - 2.4 mg/dL    Comment: Performed at [facility] [address] [place], [place] [address]  Glucose, capillary     Status: Abnormal   Collection Time: 07/02/21   3:04 PM  Result Value Ref Range   Glucose-Capillary 188 (H) 70 - 99 mg/dL    Comment: Glucose reference range applies only to samples taken after fasting for at least 8 hours.  Glucose, capillary     Status: Abnormal   Collection Time: 07/02/21  4:14 PM  Result Value Ref Range   Glucose-Capillary 187 (H) 70 - 99 mg/dL    Comment: Glucose reference range applies only to samples taken after fasting for at least 8 hours.  Glucose, capillary     Status: Abnormal   Collection Time: 07/02/21  5:20 PM  Result Value Ref Range   Glucose-Capillary 180 (H) 70 - 99 mg/dL    Comment: Glucose reference range applies only to samples taken after fasting for at least 8 hours.  Glucose, capillary     Status: Abnormal   Collection Time: 07/02/21  6:28 PM  Result Value Ref Range   Glucose-Capillary 179 (H) 70 - 99 mg/dL    Comment: Glucose reference range applies only to samples taken after fasting for at least 8 hours.  Glucose, capillary     Status: Abnormal   Collection Time: 07/02/21  7:28 PM  Result Value Ref Range   Glucose-Capillary 171 (H) 70 - 99 mg/dL    Comment: Glucose reference range applies only to samples taken after fasting for at least 8 hours.   Comment 1 Notify RN    Comment 2 Document in Chart   Glucose, capillary     Status: Abnormal   Collection Time: 07/02/21  8:45 PM  Result Value Ref Range   Glucose-Capillary 189 (H) 70 - 99 mg/dL    Comment: Glucose reference range applies only to samples taken after fasting for at least 8 hours.   Comment 1 Notify RN    Comment 2 Document in  Chart   Glucose, capillary     Status: Abnormal   Collection Time: 07/02/21  9:32 PM  Result Value Ref Range   Glucose-Capillary 163 (H) 70 - 99 mg/dL    Comment: Glucose reference range applies only to samples taken after fasting for at least 8 hours.   Comment 1 Notify RN    Comment 2 Document in Chart   Glucose, capillary     Status: Abnormal   Collection Time: 07/02/21 10:31 PM  Result Value  Ref Range   Glucose-Capillary 174 (H) 70 - 99 mg/dL    Comment: Glucose reference range applies only to samples taken after fasting for at least 8 hours.   Comment 1 Notify RN    Comment 2 Document in Chart   Glucose, capillary     Status: Abnormal   Collection Time: 07/02/21 11:29 PM  Result Value Ref Range   Glucose-Capillary 146 (H) 70 - 99 mg/dL    Comment: Glucose reference range applies only to samples taken after fasting for at least 8 hours.   Comment 1 Notify RN    Comment 2 Document in Chart   Glucose, capillary     Status: Abnormal   Collection Time: 07/03/21 12:22 AM  Result Value Ref Range   Glucose-Capillary 147 (H) 70 - 99 mg/dL    Comment: Glucose reference range applies only to samples taken after fasting for at least 8 hours.   Comment 1 Notify RN    Comment 2 Document in Chart   Comprehensive metabolic panel     Status: Abnormal   Collection Time: 07/03/21 12:35 AM  Result Value Ref Range   Sodium 128 (L) 135 - 145 mmol/L   Potassium 3.1 (L) 3.5 - 5.1 mmol/L   Chloride 104 98 - 111 mmol/L   CO2 20 (L) 22 - 32 mmol/L   Glucose, Bld POSSIBLE INTERFERING SUBSTANCE 70 - 99 mg/dL    Comment: PATIENT ON INSULIN DRIP, RN WANTED EVERYTHING ELSE RELEASED. [place]   BUN <5 (L) 6 - 20 mg/dL   Creatinine, Ser 0.64 0.44 - 1.00 mg/dL   Calcium 7.3 (L) 8.9 - 10.3 mg/dL   Total Protein 5.0 (L) 6.5 - 8.1 g/dL   Albumin 2.7 (L) 3.5 - 5.0 g/dL   AST 14 (L) 15 - 41 U/L   ALT 12 0 - 44 U/L   Alkaline Phosphatase 43 38 - 126 U/L   Total Bilirubin 0.2 (L) 0.3 - 1.2 mg/dL   GFR, Estimated >60 >60 mL/min    Comment: (NOTE) Calculated using the CKD-EPI Creatinine Equation (2021)    Anion gap 4 (L) 5 - 15    Comment: Performed at [facility], [place] [address], [place], [place] [address]  Glucose, capillary     Status: Abnormal   Collection Time: 07/03/21  1:52 AM  Result Value Ref Range   Glucose-Capillary 143 (H) 70 - 99 mg/dL    Comment: Glucose reference range applies only  to samples taken after fasting for at least 8 hours.   Comment 1 Notify RN    Comment 2 Document in Chart   Basic metabolic panel     Status: Abnormal   Collection Time: 07/03/21  2:22 AM  Result Value Ref Range   Sodium 135 135 - 145 mmol/L    Comment: DELTA CHECK NOTED   Potassium 3.2 (L) 3.5 - 5.1 mmol/L   Chloride 110 98 - 111 mmol/L   CO2 21 (L) 22 - 32 mmol/L  Glucose, Bld 175 (H) 70 - 99 mg/dL    Comment: Glucose reference range applies only to samples taken after fasting for at least 8 hours.   BUN <5 (L) 6 - 20 mg/dL   Creatinine, Ser 0.58 0.44 - 1.00 mg/dL   Calcium 8.1 (L) 8.9 - 10.3 mg/dL   GFR, Estimated >60 >60 mL/min    Comment: (NOTE) Calculated using the CKD-EPI Creatinine Equation (2021)    Anion gap 4 (L) 5 - 15    Comment: Performed at [facility] [address], [place], [place] [address]  Magnesium     Status: None   Collection Time: 07/03/21  2:22 AM  Result Value Ref Range   Magnesium 1.7 1.7 - 2.4 mg/dL    Comment: Performed at [facility] [address] [place], [place] [address]  Glucose, capillary     Status: Abnormal   Collection Time: 07/03/21  2:27 AM  Result Value Ref Range   Glucose-Capillary 142 (H) 70 - 99 mg/dL    Comment: Glucose reference range applies only to samples taken after fasting for at least 8 hours.   Comment 1 Notify RN    Comment 2 Document in Chart   Glucose, capillary     Status: Abnormal   Collection Time: 07/03/21  3:32 AM  Result Value Ref Range   Glucose-Capillary 130 (H) 70 - 99 mg/dL    Comment: Glucose reference range applies only to samples taken after fasting for at least 8 hours.   Comment 1 Notify RN    Comment 2 Document in Chart   Glucose, capillary     Status: Abnormal   Collection Time: 07/03/21  4:31 AM  Result Value Ref Range   Glucose-Capillary 130 (H) 70 - 99 mg/dL    Comment: Glucose reference range applies only to samples taken after fasting for at least 8 hours.   Comment 1 Notify  RN    Comment 2 Document in Chart   Glucose, capillary     Status: Abnormal   Collection Time: 07/03/21  5:47 AM  Result Value Ref Range   Glucose-Capillary 134 (H) 70 - 99 mg/dL    Comment: Glucose reference range applies only to samples taken after fasting for at least 8 hours.   Comment 1 Notify RN    Comment 2 Document in Chart   Glucose, capillary     Status: Abnormal   Collection Time: 07/03/21  6:27 AM  Result Value Ref Range   Glucose-Capillary 137 (H) 70 - 99 mg/dL    Comment: Glucose reference range applies only to samples taken after fasting for at least 8 hours.   Comment 1 Notify RN    Comment 2 Document in Chart   Glucose, capillary     Status: Abnormal   Collection Time: 07/03/21  7:30 AM  Result Value Ref Range   Glucose-Capillary 65 (L) 70 - 99 mg/dL    Comment: Glucose reference range applies only to samples taken after fasting for at least 8 hours.  Basic metabolic panel     Status: Abnormal   Collection Time: 07/03/21  8:11 AM  Result Value Ref Range   Sodium 139 135 - 145 mmol/L   Potassium 4.1 3.5 - 5.1 mmol/L    Comment: DELTA CHECK NOTED   Chloride 114 (H) 98 - 111 mmol/L   CO2 22 22 - 32 mmol/L   Glucose, Bld 54 (L) 70 - 99 mg/dL    Comment: Glucose  reference range applies only to samples taken after fasting for at least 8 hours.   BUN <5 (L) 6 - 20 mg/dL   Creatinine, Ser 0.57 0.44 - 1.00 mg/dL   Calcium 8.5 (L) 8.9 - 10.3 mg/dL   GFR, Estimated >60 >60 mL/min    Comment: (NOTE) Calculated using the CKD-EPI Creatinine Equation (2021)    Anion gap 3 (L) 5 - 15    Comment: Performed at [facility] [address], [place], [place] [address]  Glucose, capillary     Status: Abnormal   Collection Time: 07/03/21 11:06 AM  Result Value Ref Range   Glucose-Capillary 126 (H) 70 - 99 mg/dL    Comment: Glucose reference range applies only to samples taken after fasting for at least 8 hours.    Current Facility-Administered Medications  Medication  Dose Route Frequency Provider Last Rate Last Admin   acetaminophen (TYLENOL) tablet 650 mg  650 mg Oral Q6H PRN Candee Furbish, MD   650 mg at 07/01/21 2123   albuterol (PROVENTIL) (2.5 MG/3ML) 0.083% nebulizer solution 2.5 mg  2.5 mg Inhalation Q6H PRN Candee Furbish, MD       benzocaine (ORAJEL) 10 % mucosal gel 1 application.  1 application. Mouth/Throat Daily PRN Candee Furbish, MD       calcium carbonate (TUMS - dosed in mg elemental calcium) chewable tablet 200 mg of elemental calcium  1 tablet Oral Daily PRN Candee Furbish, MD   200 mg of elemental calcium at 07/02/21 1527   Chlorhexidine Gluconate Cloth 2 % PADS 6 each  6 each Topical Daily Candee Furbish, MD   6 each at 07/02/21 2200   dextrose 10 % infusion   Intravenous Continuous Icard, Bradley L, DO 10 mL/hr at 07/03/21 1000 Infusion Verify at 07/03/21 1000   diclofenac Sodium (VOLTAREN) 1 % topical gel 2 g  2 g Topical QID PRN Icard, Leory Plowman L, DO   2 g at 07/03/21 1133   docusate sodium (COLACE) capsule 100 mg  100 mg Oral BID PRN Candee Furbish, MD   100 mg at 07/02/21 0224   enoxaparin (LOVENOX) injection 40 mg  40 mg Subcutaneous Q24H Candee Furbish, MD       fluticasone furoate-vilanterol (BREO ELLIPTA) 200-25 MCG/ACT 1 puff  1 puff Inhalation Daily Candee Furbish, MD       melatonin tablet 3 mg  3 mg Oral QHS Candee Furbish, MD   3 mg at 07/01/21 2300   pantoprazole (PROTONIX) EC tablet 40 mg  40 mg Oral Daily Candee Furbish, MD   40 mg at 07/02/21 1324   phenylephrine (NEO-SYNEPHRINE) 29m/NS 2551mpremix infusion  0-400 mcg/min Intravenous Titrated Icard, Bradley L, DO 22.5 mL/hr at 07/03/21 1000 30 mcg/min at 07/03/21 1000   polyethylene glycol (MIRALAX / GLYCOLAX) packet 17 g  17 g Oral Daily PRN SmCandee FurbishMD   17 g at 07/02/21 1304   potassium chloride 10 mEq in 50 mL *CENTRAL LINE* IVPB  10 mEq Intravenous Q1 Hr x 2 Icard, Bradley L, DO 50 mL/hr at 07/03/21 1136 10 mEq at 07/03/21 1136   prochlorperazine  (COMPAZINE) injection 5 mg  5 mg Intravenous Once Yap, Vanessa, MD       sodium chloride flush (NS) 0.9 % injection 10-40 mL  10-40 mL Intracatheter Q12H SmCandee FurbishMD   10 mL at 07/02/21 2213   sodium chloride flush (NS) 0.9 % injection  10-40 mL  10-40 mL Intracatheter PRN Candee Furbish, MD        Musculoskeletal: Strength & Muscle Tone: within normal limits Gait & Station: normal Patient leans: N/A Psychiatric Specialty Exam: Physical Exam Vitals and nursing note reviewed. Exam conducted with a chaperone present.  Constitutional:      Appearance: Normal appearance. She is obese.  Neurological:     General: No focal deficit present.     Mental Status: She is alert and oriented to person, place, and time. Mental status is at baseline.  Psychiatric:        Mood and Affect: Mood normal.        Behavior: Behavior normal.        Thought Content: Thought content normal.        Judgment: Judgment normal.    Review of Systems  Psychiatric/Behavioral:  Positive for suicidal ideas. The patient is nervous/anxious.    Blood pressure 117/60, pulse 85, temperature 98.2 F (36.8 C), temperature source Oral, resp. rate 16, height _0  (1.676 m), weight 93.4 kg, SpO2 99 %.Body mass index is 33.23 kg/m.  General Appearance: Fairly Groomed  Eye Contact:  Fair  Speech:  Normal Rate  Volume:  Normal  Mood:  Euthymic  Affect:  Blunt and Congruent  Thought Process:  Coherent, Goal Directed, and Descriptions of Associations: Intact  Orientation:  Full (Time, Place, and Person)  Thought Content:  WDL  Suicidal Thoughts:  [place] is here for suicide attempt  Homicidal Thoughts:  No  Memory:  Immediate;   Good Recent;   Good Remote;   Good  Judgement:  Poor  Insight:  Lacking  Psychomotor Activity:  Normal  Concentration:  Concentration: Good and Attention Span: Good  Recall:  Good  Fund of Knowledge:  Good  Language:  Good  Akathisia:  No  Handed:  Right  AIMS (if indicated):      Assets:  Communication Skills Desire for Improvement Financial Resources/Insurance Leisure Time Physical Health Social Support  ADL's:  Intact  Cognition:  WNL  Sleep:        The patient is at acutely elevated risk of suicide/dangerousness to others and further worsening of psychiatric condition. Risk factors for suicide for this patient include: current diagnosis of depression, agitation, suicide attempt leading to current admission, previous acts of self-harm, past violence and chronic mood lability . Risk factors for violence for this patient include:  younger age, aggression, agitation, history of aggressive behavior and chronic impulsivity. Protective factors for this patient are limited to: motivation for treatment, supportive family, presence of an available support system, safe housing and support system in agreement with treatment recommendations. The patient does meet [facility] involuntary commitment criteria at this time. This was explained to the patient who voiced understanding.   Plan:  Recommend inpatient treatment when medically cleared.   QTc prolongation- do not resume psychotropic medications at this time. Continue current recommendations to replace mag and phos.  -Continue suicide sitter.  -Patient meets criteria for IVC if she attempts to leave.   Disposition: Recommend psychiatric Inpatient admission when medically cleared.  Suella Broad, FNP 07/03/2021 11:41 AM

## 2021-07-04 ENCOUNTER — Inpatient Hospital Stay (HOSPITAL_COMMUNITY)
Admission: AD | Admit: 2021-07-04 | Discharge: 2021-07-11 | DRG: 885 | Disposition: A | Discharge status: Home / Self Care | Payer: PPO | Source: Intra-hospital | Attending: Psychiatry | Admitting: Psychiatry

## 2021-07-04 DIAGNOSIS — I952 Hypotension due to drugs: Secondary | ICD-10-CM | POA: Diagnosis not present

## 2021-07-04 DIAGNOSIS — F840 Autistic disorder: Secondary | ICD-10-CM | POA: Diagnosis not present

## 2021-07-04 DIAGNOSIS — F250 Schizoaffective disorder, bipolar type: Secondary | ICD-10-CM

## 2021-07-04 DIAGNOSIS — T1491XA Suicide attempt, initial encounter: Secondary | ICD-10-CM

## 2021-07-04 DIAGNOSIS — F845 Asperger's syndrome: Secondary | ICD-10-CM | POA: Diagnosis present

## 2021-07-04 DIAGNOSIS — F319 Bipolar disorder, unspecified: Secondary | ICD-10-CM

## 2021-07-04 DIAGNOSIS — K219 Gastro-esophageal reflux disease without esophagitis: Secondary | ICD-10-CM | POA: Diagnosis not present

## 2021-07-04 DIAGNOSIS — F411 Generalized anxiety disorder: Secondary | ICD-10-CM | POA: Diagnosis present

## 2021-07-04 DIAGNOSIS — J45909 Unspecified asthma, uncomplicated: Secondary | ICD-10-CM | POA: Diagnosis not present

## 2021-07-04 DIAGNOSIS — F259 Schizoaffective disorder, unspecified: Secondary | ICD-10-CM | POA: Diagnosis not present

## 2021-07-04 DIAGNOSIS — Z87891 Personal history of nicotine dependence: Secondary | ICD-10-CM | POA: Diagnosis not present

## 2021-07-04 DIAGNOSIS — G4730 Sleep apnea, unspecified: Secondary | ICD-10-CM | POA: Diagnosis not present

## 2021-07-04 DIAGNOSIS — K30 Functional dyspepsia: Secondary | ICD-10-CM | POA: Diagnosis not present

## 2021-07-04 DIAGNOSIS — Z818 Family history of other mental and behavioral disorders: Secondary | ICD-10-CM | POA: Diagnosis not present

## 2021-07-04 DIAGNOSIS — T50902A Poisoning by unspecified drugs, medicaments and biological substances, intentional self-harm, initial encounter: Secondary | ICD-10-CM | POA: Diagnosis present

## 2021-07-04 DIAGNOSIS — I10 Essential (primary) hypertension: Secondary | ICD-10-CM | POA: Diagnosis present

## 2021-07-04 DIAGNOSIS — F603 Borderline personality disorder: Secondary | ICD-10-CM | POA: Diagnosis present

## 2021-07-04 DIAGNOSIS — G40909 Epilepsy, unspecified, not intractable, without status epilepticus: Secondary | ICD-10-CM | POA: Diagnosis not present

## 2021-07-04 DIAGNOSIS — Z9151 Personal history of suicidal behavior: Secondary | ICD-10-CM

## 2021-07-04 DIAGNOSIS — F315 Bipolar disorder, current episode depressed, severe, with psychotic features: Secondary | ICD-10-CM | POA: Diagnosis not present

## 2021-07-04 DIAGNOSIS — K5909 Other constipation: Secondary | ICD-10-CM | POA: Diagnosis not present

## 2021-07-04 DIAGNOSIS — T447X2A Poisoning by beta-adrenoreceptor antagonists, intentional self-harm, initial encounter: Secondary | ICD-10-CM | POA: Diagnosis present

## 2021-07-04 DIAGNOSIS — Z79899 Other long term (current) drug therapy: Secondary | ICD-10-CM

## 2021-07-04 DIAGNOSIS — X838XXA Intentional self-harm by other specified means, initial encounter: Secondary | ICD-10-CM | POA: Diagnosis not present

## 2021-07-04 DIAGNOSIS — Z20822 Contact with and (suspected) exposure to covid-19: Secondary | ICD-10-CM | POA: Diagnosis not present

## 2021-07-04 DIAGNOSIS — R000 Tachycardia, unspecified: Secondary | ICD-10-CM | POA: Diagnosis not present

## 2021-07-04 DIAGNOSIS — F4010 Social phobia, unspecified: Secondary | ICD-10-CM | POA: Diagnosis present

## 2021-07-04 DIAGNOSIS — F32A Depression, unspecified: Secondary | ICD-10-CM | POA: Diagnosis not present

## 2021-07-04 HISTORY — DX: Suicide attempt, initial encounter: T14.91XA

## 2021-07-04 LAB — BASIC METABOLIC PANEL
Anion gap: 7 (ref 5–15)
BUN: 8 mg/dL (ref 6–20)
CO2: 23 mmol/L (ref 22–32)
Calcium: 8.8 mg/dL — ABNORMAL LOW (ref 8.9–10.3)
Chloride: 108 mmol/L (ref 98–111)
Creatinine, Ser: 0.77 mg/dL (ref 0.44–1.00)
GFR, Estimated: >60 mL/min (ref >60)
Glucose, Bld: 94 mg/dL (ref 70–99)
Potassium: 4.1 mmol/L (ref 3.5–5.1)
Sodium: 138 mmol/L (ref 135–145)

## 2021-07-04 LAB — GLUCOSE, CAPILLARY: Glucose-Capillary: 93 mg/dL (ref 70–99)

## 2021-07-04 LAB — MAGNESIUM: Magnesium: 1.9 mg/dL (ref 1.7–2.4)

## 2021-07-04 LAB — SARS CORONAVIRUS 2 BY RT PCR: SARS Coronavirus 2 by RT PCR: NEGATIVE

## 2021-07-04 LAB — CALCIUM, IONIZED: Calcium, Ionized, Serum: 5.3 mg/dL (ref 4.5–5.6)

## 2021-07-04 MED ORDER — LORAZEPAM 1 MG PO TABS
1.0000 mg | ORAL_TABLET | Freq: Four times a day (QID) | ORAL | Status: DC | PRN
  Administered 2021-07-05 – 2021-07-09 (×2): 1 mg via ORAL
  Filled 2021-07-04 (×2): qty 1

## 2021-07-04 MED ORDER — FLUTICASONE FUROATE-VILANTEROL 200-25 MCG/ACT IN AEPB
1.0000 | INHALATION_SPRAY | Freq: Every day | RESPIRATORY_TRACT | Status: DC
  Administered 2021-07-05 – 2021-07-11 (×7): 1 via RESPIRATORY_TRACT
  Filled 2021-07-04: qty 28

## 2021-07-04 MED ORDER — DICLOFENAC SODIUM 1 % EX GEL
2.0000 g | Freq: Four times a day (QID) | CUTANEOUS | Status: DC | PRN
  Administered 2021-07-06 – 2021-07-07 (×2): 2 g via TOPICAL
  Filled 2021-07-04: qty 100

## 2021-07-04 MED ORDER — PANTOPRAZOLE SODIUM 40 MG PO TBEC
40.0000 mg | DELAYED_RELEASE_TABLET | Freq: Every day | ORAL | Status: DC
  Administered 2021-07-05 – 2021-07-11 (×7): 40 mg via ORAL
  Filled 2021-07-04 (×8): qty 1

## 2021-07-04 MED ORDER — MAGNESIUM HYDROXIDE 400 MG/5ML PO SUSP: 30.0000 mL | Freq: Every day | ORAL | Status: DC | PRN

## 2021-07-04 MED ORDER — CARIPRAZINE HCL 3 MG PO CAPS
3.0000 mg | ORAL_CAPSULE | Freq: Every day | ORAL | Status: DC
  Administered 2021-07-05: 3 mg via ORAL
  Filled 2021-07-04 (×4): qty 1

## 2021-07-04 MED ORDER — ZIPRASIDONE MESYLATE 20 MG IM SOLR: 20.0000 mg | INTRAMUSCULAR | Status: DC | PRN

## 2021-07-04 MED ORDER — LORAZEPAM 2 MG/ML IJ SOLN
1.0000 mg | Freq: Four times a day (QID) | INTRAMUSCULAR | Status: DC | PRN
  Filled 2021-07-04: qty 1

## 2021-07-04 MED ORDER — HYDROXYZINE HCL 10 MG PO TABS
10.0000 mg | ORAL_TABLET | Freq: Once | ORAL | Status: AC
  Administered 2021-07-04: 10 mg via ORAL
  Filled 2021-07-04: qty 1

## 2021-07-04 MED ORDER — MELATONIN 3 MG PO TABS
3.0000 mg | ORAL_TABLET | Freq: Every day | ORAL | Status: DC
  Administered 2021-07-06 – 2021-07-10 (×5): 3 mg via ORAL
  Filled 2021-07-04 (×10): qty 1

## 2021-07-04 MED ORDER — LORAZEPAM 1 MG PO TABS
1.0000 mg | ORAL_TABLET | Freq: Four times a day (QID) | ORAL | Status: DC | PRN
  Filled 2021-07-04: qty 1

## 2021-07-04 MED ORDER — ESCITALOPRAM OXALATE 10 MG PO TABS
10.0000 mg | ORAL_TABLET | Freq: Every day | ORAL | Status: DC
  Administered 2021-07-05 – 2021-07-11 (×7): 10 mg via ORAL
  Filled 2021-07-04 (×8): qty 1

## 2021-07-04 MED ORDER — IBUPROFEN 400 MG PO TABS
400.0000 mg | ORAL_TABLET | Freq: Four times a day (QID) | ORAL | Status: DC | PRN
Start: 2021-07-04 — End: 2021-07-11
  Administered 2021-07-06 – 2021-07-10 (×4): 400 mg via ORAL
  Filled 2021-07-04 (×5): qty 1

## 2021-07-04 MED ORDER — ALBUTEROL SULFATE (2.5 MG/3ML) 0.083% IN NEBU: 2.5000 mg | INHALATION_SOLUTION | Freq: Four times a day (QID) | RESPIRATORY_TRACT | Status: DC | PRN

## 2021-07-04 MED ORDER — CARIPRAZINE HCL 1.5 MG PO CAPS
3.0000 mg | ORAL_CAPSULE | Freq: Every day | ORAL | Status: DC
  Administered 2021-07-04: 3 mg via ORAL
  Filled 2021-07-04: qty 1
  Filled 2021-07-04 (×2): qty 2

## 2021-07-04 MED ORDER — LORAZEPAM 2 MG/ML IJ SOLN: 1.0000 mg | Freq: Four times a day (QID) | INTRAMUSCULAR | Status: DC | PRN

## 2021-07-04 MED ORDER — POLYETHYLENE GLYCOL 3350 17 G PO PACK: 17.0000 g | PACK | Freq: Every day | ORAL | Status: DC | PRN

## 2021-07-04 MED ORDER — ALUM & MAG HYDROXIDE-SIMETH 200-200-20 MG/5ML PO SUSP: 30.0000 mL | ORAL | Status: DC | PRN

## 2021-07-04 MED ORDER — CALCIUM CARBONATE ANTACID 500 MG PO CHEW
1.0000 | CHEWABLE_TABLET | Freq: Every day | ORAL | Status: DC | PRN
  Administered 2021-07-08 – 2021-07-10 (×2): 200 mg via ORAL
  Filled 2021-07-04 (×2): qty 1

## 2021-07-04 MED ORDER — LORAZEPAM 1 MG PO TABS: 1.0000 mg | ORAL_TABLET | ORAL | Status: DC | PRN

## 2021-07-04 MED ORDER — ACETAMINOPHEN 325 MG PO TABS: 650.0000 mg | ORAL_TABLET | Freq: Four times a day (QID) | ORAL | Status: DC | PRN

## 2021-07-04 MED ORDER — RISPERIDONE 2 MG PO TBDP: 2.0000 mg | ORAL_TABLET | Freq: Three times a day (TID) | ORAL | Status: DC | PRN

## 2021-07-04 MED ORDER — ESCITALOPRAM OXALATE 10 MG PO TABS
10.0000 mg | ORAL_TABLET | Freq: Every day | ORAL | Status: DC
  Administered 2021-07-04: 10 mg via ORAL
  Filled 2021-07-04 (×2): qty 1

## 2021-07-04 NOTE — Progress Notes (Signed)
Seen and examined-upset that PICC line is still in her-and wants it removed.  Refuses physical exam (sitter at bedside).  She is completely awake and alert.  Telemetry unremarkable.  QTc has improved.  Electrolytes are stable.  She is medically stable to be transferred to behavioral health today.  Full note to follow.

## 2021-07-04 NOTE — Discharge Summary (Signed)
PATIENT DETAILS Name: [name] Age: 31 y.o. Sex: female Date of Birth: 06-May-1990 MRN: [id number]. Admitting Physician: Candee Furbish, MD GGY:IRSWNI, [name]  Admit Date: 07/01/2021 Discharge date: 07/04/2021  Recommendations for Outpatient Follow-up:  Follow up with PCP in 1-2 weeks   Admitted From:  Home  Disposition: [facility]   Discharge Condition: good  CODE STATUS:   Code Status: Full Code   Diet recommendation:  Diet Order             Diet general           Diet Carb Modified Fluid consistency: Thin; Room service appropriate? Yes  Diet effective now                    Brief Summary: 31 year old with history of depression, schizoaffective disorder, borderline personality-multiple suicide attempts in the past-presented to the hospital with propanolol overdose.  Brief Hospital Course: Intentional propanolol overdose/suicide attempt: Managed in the ICU-required D10/IV insulin and Neo-Synephrine infusion.  Upon stability-transfer to [facility] service on 5/30.  Evaluated by psychiatry-team to require inpatient psychiatric hospitalization.  Medically stable to be transferred.  History of depression/schizoaffective disorder-prior history of multiple suicide attempts: QTc better-we will defer initiation of psychiatric medications to the psychiatry team.  Rest of her medical issues were stable during this hospitalization.  Obesity: Estimated body mass index is 33.7 kg/m as calculated from the following:   Height as of this encounter: '5\' 6"'$  (1.676 m).   Weight as of this encounter: 94.7 kg.   Discharge Diagnoses:  Principal Problem:   Suicide [facility])  Discharge Instructions:  Activity:  As tolerated   Discharge Instructions     Diet general   Complete by: As directed    Increase activity slowly   Complete by: As directed       Allergies as of 07/04/2021       Reactions   Bee Venom Anaphylaxis   Coconut Flavor Anaphylaxis, Rash    Geodon [ziprasidone Hcl] Other (See Comments)   Pt states that this medication causes paralysis of the mouth.     Haloperidol And Related Other (See Comments)   Pt states that this medication causes paralysis of the mouth, jaw locks up   Lithium Other (See Comments)   Seizure-like activity   Oxycodone Other (See Comments)   Hallucinations    Quetiapine Other (See Comments)   Pt states that this medication is too strong.    Shellfish Allergy Anaphylaxis   Phenergan [promethazine Hcl] Other (See Comments)   Chest pain    Prilosec [omeprazole] Nausea And Vomiting   Sulfa Antibiotics Other (See Comments)   Chest pain    Tegretol [carbamazepine] Nausea And Vomiting   Prozac [fluoxetine] Other (See Comments)   Increased Depression , Suicidal thoughts   Tape Other (See Comments)   Skin tears, can only tolerate paper tape.   Tylenol [acetaminophen]    Unspecified reaction        Medication List     STOP taking these medications    acetaminophen 325 MG tablet Commonly known as: Tylenol   benzocaine 10 % mucosal gel Commonly known as: ORAJEL   cariprazine 3 MG capsule Commonly known as: VRAYLAR   escitalopram 10 MG tablet Commonly known as: LEXAPRO   gabapentin 100 MG capsule Commonly known as: NEURONTIN   hydrOXYzine 10 MG tablet Commonly known as: ATARAX   lidocaine 5 % Commonly known as: Lidoderm   ondansetron 4 MG tablet Commonly known as:  ZOFRAN   propranolol 10 MG tablet Commonly known as: INDERAL       TAKE these medications    albuterol 108 (90 Base) MCG/ACT inhaler Commonly known as: VENTOLIN HFA Inhale 2 puffs into the lungs every 6 (six) hours as needed for wheezing or shortness of breath.   budesonide-formoterol 160-4.5 MCG/ACT inhaler Commonly known as: SYMBICORT Inhale 2 puffs into the lungs 2 (two) times daily.   calcium carbonate 500 MG chewable tablet Commonly known as: TUMS - dosed in mg elemental calcium Chew 1 tablet by mouth daily  as needed for indigestion or heartburn.   diclofenac Sodium 1 % Gel Commonly known as: Voltaren Apply 2 g topically 4 (four) times daily.   ibuprofen 200 MG tablet Commonly known as: ADVIL Take 400-600 mg by mouth every 6 (six) hours as needed for moderate pain or headache.   melatonin 3 MG Tabs tablet Take 3 mg by mouth at bedtime.   pantoprazole 40 MG tablet Commonly known as: PROTONIX TAKE 1 TABLET BY MOUTH DAILY What changed: how to take this        Allergies  Allergen Reactions   Bee Venom Anaphylaxis   Coconut Flavor Anaphylaxis and Rash   Geodon [Ziprasidone Hcl] Other (See Comments)    Pt states that this medication causes paralysis of the mouth.     Haloperidol And Related Other (See Comments)    Pt states that this medication causes paralysis of the mouth, jaw locks up   Lithium Other (See Comments)    Seizure-like activity    Oxycodone Other (See Comments)    Hallucinations    Quetiapine Other (See Comments)    Pt states that this medication is too strong.    Shellfish Allergy Anaphylaxis   Phenergan [Promethazine Hcl] Other (See Comments)    Chest pain     Prilosec [Omeprazole] Nausea And Vomiting   Sulfa Antibiotics Other (See Comments)    Chest pain    Tegretol [Carbamazepine] Nausea And Vomiting   Prozac [Fluoxetine] Other (See Comments)    Increased Depression , Suicidal thoughts   Tape Other (See Comments)    Skin tears, can only tolerate paper tape.   Tylenol [Acetaminophen]     Unspecified reaction     Other Procedures/Studies: DG Lumbar Spine Complete  Result Date: 06/23/2021 CLINICAL DATA:  Back pain. EXAM: LUMBAR SPINE - COMPLETE 4+ VIEW COMPARISON:  Lumbar spine x-rays dated May 30, 2021. FINDINGS: Five lumbar type vertebral bodies. No acute fracture or subluxation. Vertebral body heights are preserved. Alignment is normal. Intervertebral disc spaces are maintained. The sacroiliac joints are unremarkable. IMPRESSION: 1. Negative.  Electronically Signed   By: Titus Dubin M.D.   On: 06/23/2021 09:56   DG Knee Complete 4 Views Left  Result Date: 06/23/2021 CLINICAL DATA:  Chronic bilateral knee pain. EXAM: RIGHT KNEE - COMPLETE 4+ VIEW; LEFT KNEE - COMPLETE 4+ VIEW COMPARISON:  None Available. FINDINGS: Left knee: No acute fracture or malalignment. No joint effusion. Joint spaces are preserved. Soft tissues are unremarkable. Right knee: No acute fracture or malalignment. No joint effusion. Early medial compartment joint space narrowing with tiny marginal osteophytes. Soft tissues are unremarkable. IMPRESSION: 1. Early medial compartment osteoarthritis of the right knee. 2. Normal left knee. Electronically Signed   By: Titus Dubin M.D.   On: 06/23/2021 09:58   DG Knee Complete 4 Views Right  Result Date: 06/23/2021 CLINICAL DATA:  Chronic bilateral knee pain. EXAM: RIGHT KNEE - COMPLETE 4+ VIEW; LEFT KNEE -  COMPLETE 4+ VIEW COMPARISON:  None Available. FINDINGS: Left knee: No acute fracture or malalignment. No joint effusion. Joint spaces are preserved. Soft tissues are unremarkable. Right knee: No acute fracture or malalignment. No joint effusion. Early medial compartment joint space narrowing with tiny marginal osteophytes. Soft tissues are unremarkable. IMPRESSION: 1. Early medial compartment osteoarthritis of the right knee. 2. Normal left knee. Electronically Signed   By: Titus Dubin M.D.   On: 06/23/2021 09:58   [name] EKG SITE RITE  Result Date: 07/01/2021 If Site Rite image not attached, placement could not be confirmed due to current cardiac rhythm.    TODAY-DAY OF DISCHARGE:  Subjective:   [name] today once the PICC line removed-she is uncooperative-does not want me touching her in order to do a  physical exam.  Bedside sitter in place.  Objective:   Blood pressure 106/78, pulse (!) 105, temperature 98.3 F (36.8 C), temperature source Oral, resp. rate 13, height '5\' 6"'$  (1.676 m), weight 94.7 kg,  SpO2 98 %.  Intake/Output Summary (Last 24 hours) at 07/04/2021 1144 Last data filed at 07/04/2021 0600 Gross per 24 hour  Intake 745.25 ml  Output 400 ml  Net 345.25 ml   Filed Weights   07/01/21 1057 07/02/21 0500 07/03/21 2212  Weight: 92.5 kg 93.4 kg 94.7 kg    Exam: Awake Alert, Oriented *3, No new F.N deficits, Normal affect Not in any distress.   PERTINENT RADIOLOGIC STUDIES: No results found.   PERTINENT LAB RESULTS: CBC: No results for input(s): WBC, HGB, HCT, PLT in the last 72 hours. CMET CMP     Component Value Date/Time   NA 138 07/04/2021 0534   NA 140 05/31/2015 1241   K 4.1 07/04/2021 0534   CL 108 07/04/2021 0534   CO2 23 07/04/2021 0534   GLUCOSE 94 07/04/2021 0534   BUN 8 07/04/2021 0534   BUN 6 05/31/2015 1241   CREATININE 0.77 07/04/2021 0534   CREATININE 0.60 05/05/2015 1051   CALCIUM 8.8 (L) 07/04/2021 0534   PROT 5.0 (L) 07/03/2021 0035   PROT 7.3 05/31/2015 1241   ALBUMIN 2.7 (L) 07/03/2021 0035   ALBUMIN 4.7 05/31/2015 1241   AST 14 (L) 07/03/2021 0035   ALT 12 07/03/2021 0035   ALKPHOS 43 07/03/2021 0035   BILITOT 0.2 (L) 07/03/2021 0035   BILITOT 0.3 05/31/2015 1241   GFRNONAA >60 07/04/2021 0534   GFRAA >60 08/20/2019 0932    GFR Estimated Creatinine Clearance: 117.1 mL/min (by C-G formula based on SCr of 0.77 mg/dL). No results for input(s): LIPASE, AMYLASE in the last 72 hours. No results for input(s): CKTOTAL, CKMB, CKMBINDEX, TROPONINI in the last 72 hours. Invalid input(s): POCBNP No results for input(s): DDIMER in the last 72 hours. No results for input(s): HGBA1C in the last 72 hours. No results for input(s): CHOL, HDL, LDLCALC, TRIG, CHOLHDL, LDLDIRECT in the last 72 hours. No results for input(s): TSH, T4TOTAL, T3FREE, THYROIDAB in the last 72 hours.  Invalid input(s): FREET3 No results for input(s): VITAMINB12, FOLATE, FERRITIN, TIBC, IRON, RETICCTPCT in the last 72 hours. Coags: No results for input(s): INR in  the last 72 hours.  Invalid input(s): PT Microbiology: Recent Results (from the past 240 hour(s))  MRSA Next Gen by PCR, Nasal     Status: None   Collection Time: 07/01/21  6:12 PM   Specimen: Nasal Mucosa; Nasal Swab  Result Value Ref Range Status   MRSA by PCR Next Gen NOT DETECTED NOT DETECTED Final  Comment: (NOTE) The GeneXpert MRSA Assay (FDA approved for NASAL specimens only), is one component of a comprehensive MRSA colonization surveillance program. It is not intended to diagnose MRSA infection nor to guide or monitor treatment for MRSA infections. Test performance is not FDA approved in patients less than 40 years old. Performed at [facility], [place] [address], [place], [place] [address]     FURTHER DISCHARGE INSTRUCTIONS:  Get Medicines reviewed and adjusted: Please take all your medications with you for your next visit with your Primary MD  Laboratory/radiological data: Please request your Primary MD to go over all hospital tests and procedure/radiological results at the follow up, please ask your Primary MD to get all Hospital records sent to his/her office.  In some cases, they will be blood work, cultures and biopsy results pending at the time of your discharge. Please request that your primary care M.D. goes through all the records of your hospital data and follows up on these results.  Also Note the following: If you experience worsening of your admission symptoms, develop shortness of breath, life threatening emergency, suicidal or homicidal thoughts you must seek medical attention immediately by calling 911 or calling your MD immediately  if symptoms less severe.  You must read complete instructions/literature along with all the possible adverse reactions/side effects for all the Medicines you take and that have been prescribed to you. Take any new Medicines after you have completely understood and accpet all the possible adverse reactions/side effects.    Do not drive when taking Pain medications or sleeping medications (Benzodaizepines)  Do not take more than prescribed Pain, Sleep and Anxiety Medications. It is not advisable to combine anxiety,sleep and pain medications without talking with your primary care practitioner  Special Instructions: If you have smoked or chewed Tobacco  in the last 2 yrs please stop smoking, stop any regular Alcohol  and or any Recreational drug use.  Wear Seat belts while driving.  Please note: You were cared for by a hospitalist during your hospital stay. Once you are discharged, your primary care physician will handle any further medical issues. Please note that NO REFILLS for any discharge medications will be authorized once you are discharged, as it is imperative that you return to your primary care physician (or establish a relationship with a primary care physician if you do not have one) for your post hospital discharge needs so that they can reassess your need for medications and monitor your lab values.  Total Time spent coordinating discharge including counseling, education and face to face time equals less than 30 minutes.  SignedOren Binet 07/04/2021 11:44 AM

## 2021-07-04 NOTE — Progress Notes (Signed)
eLink Physician-Brief Progress Note Patient Name: [name] DOB: 05-Jun-1990 MRN: [id number]   Date of Service  07/04/2021  HPI/Events of Note  Patient screaming for her home Atarax. Patient states that this is the only thing that helps to calm her down. QTc interval = 0.477 seconds.   eICU Interventions  Plan: Atarax 10 mg PO X 1 now.      Intervention Category Major Interventions: Delirium, psychosis, severe agitation - evaluation and management  Gustaf Mccarter Eugene 07/04/2021, 4:44 AM

## 2021-07-04 NOTE — Progress Notes (Signed)
Pt refusing telemetry 

## 2021-07-04 NOTE — Progress Notes (Signed)
C/O edema to R Forearm below PICC site. R Upper arm WNL. R forearm with marked edema and endorsed tenderness to forearm. Recommended R arm dopplers if pain/edema persist to primary RN.

## 2021-07-04 NOTE — Progress Notes (Signed)
Chaplain responded to [facility] page. Bao was lying in bed tearful during our visit. She continued to state that she was afraid, but declined to share more about the source of her fears. Erma's speech was somewhat disorganized and tangential, but chaplain was able to discern that Sophea believes some people on Facebook are saying that she is responsible for her husband's death. She continued to express feelings of guilt and grief over her husband's death in 2017-05-04. She lamented that he is no longer present and does not know their daughter Baruch Gouty who is 90 years old. Lameisha stated that she did not intend to harm herself by taking a lot of medication and reports she is too afraid to eat here and concerned about what the team wants to put into her line. She says she would like to speak to her therapist, Butch Penny at [facility], who is the only person she trusts. Chaplain provided reflective and empathetic listening and attempted to assist pt with reframing.  Please page as further needs arise.  Donald Prose. Elyn Peers, M.Div. [facility] Chaplain Pager [phone] Office [phone]

## 2021-07-04 NOTE — Progress Notes (Signed)
Pt. Was very anxious about PICC removal. Pt. Did not want to lie down at first, but did after some convincing, lay down on her left side with right arm raised up. Pt. Did reasonably well with dressing and PICC removal. She did immediately sit up afterward. She did allow me to dress the site with medipore, gauze, and vaseline gauze. No bleeding observed.

## 2021-07-04 NOTE — Progress Notes (Signed)
Pt is refusing to have labs drawn.

## 2021-07-04 NOTE — Progress Notes (Signed)
Pt requested that Chaplain come see her. Secretary paged [employer].

## 2021-07-04 NOTE — Progress Notes (Signed)
Pt called me to the room requesting that I make a note that the only person that can contact her is her therapist Suzan Slick

## 2021-07-04 NOTE — TOC Transition Note (Signed)
Transition of Care [facility]) - CM/SW Discharge Note   Patient Details  Name: [name] MRN: [id number] Date of Birth: 11/02/1990  Transition of Care [facility]) CM/SW Contact:  Coralee Pesa, [place] Phone Number: 07/04/2021, 3:47 PM   Clinical Narrative:    Pt to be transported to [facility] at 2100 pending negative covid test. Nurse to call report to [phone]. 30 minutes prior to discharge RN to call for [employer] transport , [phone] opt 1 opt 3. IVC paperwork in DC packet on chart.   Final next level of care: Psychiatric Hospital Barriers to Discharge: Barriers Resolved   Patient Goals and CMS Choice        Discharge Placement              Patient chooses bed at:  [facility]) Patient to be transferred to facility by: [employer] Name of family member notified: N/A Patient and family notified of of transfer: 07/04/21  Discharge Plan and Services                                     Social Determinants of Health (SDOH) Interventions     Readmission Risk Interventions     View : No data to display.

## 2021-07-04 NOTE — Progress Notes (Signed)
Pt is scratching her left forearm intentionally trying to make herself bleed- The sitter and I both tried to encourage her to stop. She states over and over that "I can do what I want, I don't give a fuck- I have done this before and gave myself MRSA and it got infected."  We will continue to encourage her stop.

## 2021-07-04 NOTE — Consult Note (Signed)
Patient continues to meet inpatient psychiatric hospital at this time. SHe has been tentatively accepted to [facility] 404-1 at 2100. Nursing may call report [phone].   Patient has been oppositional today and refusing most standard procedures and tasks to include admitting labs, vitals signs and d/c of lines. She has been screaming for her therapist Butch Penny all day. This nurse practitioner was able to locate the therapists number in the chart, after careful review. Contact information has been provided to nurse Evelene Croon, to contact therapist for brief communication. In the interim continue to encourage patient to use coping skills and behavior modification techniques to manage emotions and behaviors.   Psychiatry consult liason will sign off at this time. Transfer orders have been placed.

## 2021-07-04 NOTE — Progress Notes (Signed)
Room secure. All possible tubes, cords, gloves, sharps have been already removed from the room.   Pt in bathroom, sitting on toilet. Pt is bouncing her heels and is visibly agitated. Safety sitter present and within line of sight. Pt is yelling that "I want this God damn PICC line out. I don't want to be fucking touched." RN reassured pt that she was just checking in at shift change introduce herself and to let her know who would be taking care of her today.   Per tech, Pt refusing CBG check. Pt continually removing telemetry wires.

## 2021-07-04 NOTE — Progress Notes (Signed)
Clinical cytogeneticist, stated pt was trying to disconnect her fluids that were running. RN responded to room. Pt in bed, barely covered by her gown around her waist. Rn informed patient that we have good news, we can disconnect the IV fluids, that she doesn't need them anymore. Pt again states "get this damn PICC line out". I informed patient that we are working on it. I informed patient that I just needed to lock the line. Rn reached down to lock clips on PICC line. Patient smacked RN hand "Don't touch it". RN informed patient that "I'm not going to touch you, I just need to clip the lock on the PICC line. It's for safety." Pt allowed RN to lock 2 lines. Pt remained agitated with her hand raised as if to strike RN again.

## 2021-07-05 ENCOUNTER — Other Ambulatory Visit: Payer: Self-pay

## 2021-07-05 ENCOUNTER — Encounter (HOSPITAL_COMMUNITY): Payer: Self-pay | Admitting: Family

## 2021-07-05 ENCOUNTER — Encounter (HOSPITAL_COMMUNITY): Payer: Self-pay

## 2021-07-05 DIAGNOSIS — F319 Bipolar disorder, unspecified: Secondary | ICD-10-CM

## 2021-07-05 DIAGNOSIS — F250 Schizoaffective disorder, bipolar type: Secondary | ICD-10-CM

## 2021-07-05 DIAGNOSIS — F315 Bipolar disorder, current episode depressed, severe, with psychotic features: Principal | ICD-10-CM

## 2021-07-05 MED ORDER — GABAPENTIN 300 MG PO CAPS
300.0000 mg | ORAL_CAPSULE | Freq: Three times a day (TID) | ORAL | Status: DC
  Administered 2021-07-05 – 2021-07-11 (×18): 300 mg via ORAL
  Filled 2021-07-05 (×23): qty 1

## 2021-07-05 MED ORDER — TRAZODONE HCL 50 MG PO TABS
50.0000 mg | ORAL_TABLET | Freq: Every evening | ORAL | Status: DC | PRN
Start: 2021-07-05 — End: 2021-07-11

## 2021-07-05 MED ORDER — HYDROXYZINE HCL 25 MG PO TABS
25.0000 mg | ORAL_TABLET | Freq: Three times a day (TID) | ORAL | Status: DC | PRN
  Administered 2021-07-05 – 2021-07-07 (×5): 25 mg via ORAL
  Filled 2021-07-05 (×6): qty 1

## 2021-07-05 MED ORDER — ENSURE ENLIVE PO LIQD
237.0000 mL | Freq: Two times a day (BID) | ORAL | Status: DC
  Administered 2021-07-06 – 2021-07-11 (×9): 237 mL via ORAL
  Filled 2021-07-05 (×13): qty 237

## 2021-07-05 MED ORDER — PROPRANOLOL HCL 10 MG PO TABS
10.0000 mg | ORAL_TABLET | Freq: Two times a day (BID) | ORAL | Status: DC
  Administered 2021-07-05 – 2021-07-11 (×11): 10 mg via ORAL
  Filled 2021-07-05 (×16): qty 1

## 2021-07-05 NOTE — BHH Suicide Risk Assessment (Signed)
[facility] INPATIENT:  Family/Significant Other Suicide Prevention Education  Suicide Prevention Education:  Patient Refusal for Family/Significant Other Suicide Prevention Education: The patient [name] has refused to provide written consent for family/significant other to be provided Family/Significant Other Suicide Prevention Education during admission and/or prior to discharge.  Physician notified.  Frutoso [place] Lennartz 07/05/2021, 10:07 AM

## 2021-07-05 NOTE — Group Note (Signed)
Recreation Therapy Group Note   Group Topic:Team Building  Group Date: 07/05/2021 Start Time: 0930 End Time: 0956 Facilitators: Victorino Sparrow, LRT,CTRS Location: [address] Hall Dayroom   Goal Area(s) Addresses:  Patient will effectively work with peer towards shared goal.  Patient will identify skills used to make activity successful.  Patient will identify how skills used during activity can be applied to reach post d/c goals.   Group Description: [employer]. In teams of 3-4, patients were given 25 small craft pipe cleaners. Using the materials provided, patients were instructed to compete again the opposing team(s) to build the tallest free-standing structure from floor level. The activity was timed; difficulty increased by Probation officer as Pharmacist, hospital continued.  Systematically resources were removed with additional directions for example, placing one arm behind their back, working in silence, and shape stipulations. LRT facilitated post-activity discussion reviewing team processes and necessary communication skills involved in completion. Patients were encouraged to reflect how the skills utilized, or not utilized, in this activity can be incorporated to positively impact support systems post discharge.   Affect/Mood: N/A   Participation Level: Did not attend    Clinical Observations/Individualized Feedback:     Plan: Continue to engage patient in RT group sessions 2-3x/week.   Victorino Sparrow, Glennis Brink  07/05/2021 12:24 PM

## 2021-07-05 NOTE — Tx Team (Signed)
Initial Treatment Plan 07/05/2021 2:26 AM [name] GOV:[id number]    PATIENT STRESSORS: Marital or family conflict   Other: Wants other outpatient services     PATIENT STRENGTHS: Supportive family/friends  Work skills    PATIENT IDENTIFIED PROBLEMS: Mood instability  Paranoia  Depression                  DISCHARGE CRITERIA:  Medical problems require only outpatient monitoring Reduction of life-threatening or endangering symptoms to within safe limits Verbal commitment to aftercare and medication compliance  PRELIMINARY DISCHARGE PLAN: Outpatient therapy Return to previous living arrangement  PATIENT/FAMILY INVOLVEMENT: This treatment plan has been presented to and reviewed with the patient, [name].  The patient and family have been given the opportunity to ask questions and make suggestions.  Azucena Cecil, RN 07/05/2021, 2:26 AM

## 2021-07-05 NOTE — Progress Notes (Signed)
Admission Note:   [name] is a 31 year old IVC' ed patient who was brought to the hospital after an intentional overdose on prescription medication. I had fleeting suicidal thoughts and I acted on impulse." Pt was discharged from [facility] and she felt she shouldn't have been discharged.   She states that she was trying to prove a point that when someone comes in for help you should not discharged them home. She states that she initially presented because she was having thoughts of wanting to cut. She states that she has been good and did not cut herself. She states that she counted the number of pills and took 5 propranolol pills, 3 Vraylar pills, 2 Lexapro pills, and 4 Neurontin pills in order to prove a point. She states that she is trying to speak up for mental illness.  Admission Assessment:   Pt was very anxious and paranoid as soon as she entered the building. Pt required multiple prompting by staff to start her admission process and complete skin assessment. Pt refused to sit down to obtain her vitals. Pt very fidgety and having crying spells during admission. Pt stated " I do not trust no one, do not touch me" multiple times before being escorted to her room. Pt appeared to be menstruating but refused to wear any sanitary napkins.   Skin was assessed and found to be have ecchymosis bilaterally on arms. Pt searched and no contraband found, POC and unit policies explained and understanding verbalized. Consents obtained. Food and fluids offered, and pt refused. Pt had no additional questions or concerns.

## 2021-07-05 NOTE — BHH Suicide Risk Assessment (Signed)
[facility] Admission Suicide Risk Assessment   Nursing information obtained from:  Patient, Review of record Demographic factors:  Caucasian Current Mental Status:  NA Loss Factors:  NA Historical Factors:  Impulsivity Risk Reduction Factors:  NA  Total Time spent with patient: 45 minutes Principal Problem: Bipolar I disorder, current or most recent episode depressed, with psychotic features ([place]) Diagnosis:  Principal Problem:   Bipolar I disorder, current or most recent episode depressed, with psychotic features ([place]) Active Problems:   Borderline personality disorder ([place])   Autism   GAD (generalized anxiety disorder)   Suicide attempt ([place])  Subjective Data:  Patient is a 31 year old female with a reported past psychiatric history of "intellectual disability, autism, bipolar disorder, depression, anxiety, and borderline personality disorder" who was admitted to the psychiatric hospital from [facility] after suicide attempt via overdose on 2 weeks of: propranolol (280 mg per medical record), vryalar '3mg'$  once daily, gabapentin 200 mg tid, lexapro 10 mg once daily, and protonix. Patient was treated and medically cleared at [facility] prior to transfer and admission to the psychiatric unit.   Reported home psychiatric medications: Vraylar 3 mg once daily, Lexapro 10 mg once daily, Vistaril 10 mg as needed, gabapentin 200 mg 3 times daily, propranolol.   On assessment today, the patient reports that prior to his current suicide attempt, she had attempted suicide by overdose the week prior.  She reports she had attempted suicide "because I was not being treated respectfully and no one would listen to me".  She reports multiple stressors contributing to worsening depressed mood, worsening depression, and paranoia including her ex-husband's family posting on Facebook that she killed him (patient states that this person overdosed on medication and died), other interpersonal conflicts and also  financial stressors including leaving her job last week.  She reports having suicidal thoughts for "a couple of days" prior to this most recent suicide attempt.  She reports worsening depressed mood, increased anxiety, increased paranoid thoughts for a couple of weeks.   On assessment today, the patient reports that her main concerns are symptoms of anxiety and paranoia.  She reports she feels paranoid that other people are out to get her, other people are talking about her and "other people have me scared".  When asked to elaborate further she states "I cannot say any more about this".  She is suspicious and guarded.  She reports her anxiety is excessive and is worsened secondary to paranoid thoughts. She reports that mood is depressed, overwhelmed, and anxious.  She reports having some anhedonia.  She reports poor sleep secondary to high level of anxiety and feeling scared.  She reports low energy, poor concentration.  Reports appetite is okay.  At this time she denies having any suicidal thoughts.  She denies having any homicidal thoughts.  She does report having panic attacks that come and go, sometimes occurring once daily. She reports most recent manic episode was in March 2023.  She reports she was started on Vraylar around that time, which has helped stabilize her mood since then, and she has not had recurrence of manic episodes since starting Vraylar. She reports having auditory hallucinations, off and on, "it is God.  He is talking to me.  Actually I do not know if he is really talking to me I can feel his presence".  She denies having any visual hallucinations.  She does report having symptoms of thought control, that coincide with increased paranoia.  Denies having any symptoms of ideas  of reference.     Past psychiatric history: Diagnosis: Reported "intellectual disability, autism, bipolar disorder, depression, anxiety, borderline personality disorder" Patient reports history of multiple  psychiatric hospitalizations Patient reports history of multiple suicide attempts Current medications: See above Past psychiatric medication history: " I have taken so many I cannot recall.  But I do recall that Tegretol made blood come out of my mouth".    Past medical history: Asthma, arthritis Denies history of seizures Surgical history: Oral surgery Allergies: Multiple, see medical record  Social history: Born in [place], raised in [place], lives in [place].  Widowed.  1 child that is 81 years old.  Stopped working last week.  Family history: Psychiatric family history: Father: Depression.  Brother: Autism.  Patient reports father and 2 uncles attempted suicide.  Substance use history: Rare alcohol use.  Denies tobacco or nicotine use.  Denies other illicit drug use.  Continued Clinical Symptoms:  Alcohol Use Disorder Identification Test Final Score (AUDIT): 0 The "Alcohol Use Disorders Identification Test", Guidelines for Use in Primary Care, Second Edition.  World Pharmacologist [facility]). Score between 0-7:  no or low risk or alcohol related problems. Score between 8-15:  moderate risk of alcohol related problems. Score between 16-19:  high risk of alcohol related problems. Score 20 or above:  warrants further diagnostic evaluation for alcohol dependence and treatment.   CLINICAL FACTORS:   Severe Anxiety and/or Agitation Panic Attacks Bipolar Disorder:   Depressive phase More than one psychiatric diagnosis Previous Psychiatric Diagnoses and Treatments     Psychiatric Specialty Exam:  Presentation  General Appearance: Disheveled  Eye Contact:Minimal  Speech:Normal Rate  Speech Volume:Normal  Handedness:Right   Mood and Affect  Mood:Anxious; Depressed; Dysphoric  Affect:Congruent; Labile   Thought Process  Thought Processes:Linear  Descriptions of Associations:Intact  Orientation:Full (Time, Place and Person)  Thought  Content:Logical  History of Schizophrenia/Schizoaffective disorder:-- (unlcear per medical record vs pt report)  Duration of Psychotic Symptoms:Greater than six months  Hallucinations:Hallucinations: Auditory  Ideas of Reference:Paranoia  Suicidal Thoughts:Suicidal Thoughts: No  Homicidal Thoughts:Homicidal Thoughts: No   Sensorium  Memory:Immediate Good; Recent Good; Remote Good  Judgment:Poor  Insight:Poor   Executive Functions  Concentration:Poor  Attention Span:Poor  [place] of Knowledge:Good  Language:Good   Psychomotor Activity  Psychomotor Activity:Psychomotor Activity: Restlessness   Assets  Assets:Communication Skills; Housing; Desire for Improvement; Physical Health; Social Support; Leisure Time   Sleep  Sleep:Sleep: Poor    Physical Exam: Physical Exam See H&P  ROS See H&P  Blood pressure (!) 120/106, pulse (!) 112, temperature 98.9 F (37.2 C), temperature source Oral, resp. rate 18, height '5\' 6"'$  (1.676 m), weight 93.4 kg, SpO2 100 %. Body mass index is 33.25 kg/m.   COGNITIVE FEATURES THAT CONTRIBUTE TO RISK:  Paranoia     SUICIDE RISK:   Moderate:  Frequent suicidal ideation with limited intensity, and duration, some specificity in terms of plans, no associated intent, good self-control, limited dysphoria/symptomatology, some risk factors present, and identifiable protective factors, including available and accessible social support.  PLAN OF CARE:   See H&P for assessment, diagnosis list, and plan.    I certify that inpatient services furnished can reasonably be expected to improve the patient's condition.   Christoper Allegra, MD 07/05/2021, 5:14 PM

## 2021-07-05 NOTE — Progress Notes (Signed)
Pt denies SI/HI. Pt says her family is out to get her.  She is shaking in bed saying "no one understands me."  Pt will not respond if she is having AVH.  Pt says her goal for today is to go down to the cafeteria for dinner.  RN will continue to monitor pt's progress and provide support as needed.

## 2021-07-05 NOTE — BHH Group Notes (Signed)
Pt did not attend psychoeducational group. 

## 2021-07-05 NOTE — BH IP Treatment Plan (Signed)
Interdisciplinary Treatment and Diagnostic Plan Update  07/05/2021 Time of Session: 9:35am  [name] MRN: [id number]  Principal Diagnosis: Suicide attempt [facility])  Secondary Diagnoses: Principal Problem:   Suicide attempt [facility])   Current Medications:  Current Facility-Administered Medications  Medication Dose Route Frequency Provider Last Rate Last Admin   acetaminophen (TYLENOL) tablet 650 mg  650 mg Oral Q6H PRN Starkes-Perry, Gayland Curry, FNP       albuterol (PROVENTIL) (2.5 MG/3ML) 0.083% nebulizer solution 2.5 mg  2.5 mg Inhalation Q6H PRN Starkes-Perry, Gayland Curry, FNP       alum & mag hydroxide-simeth (MAALOX/MYLANTA) 200-200-20 MG/5ML suspension 30 mL  30 mL Oral Q4H PRN Starkes-Perry, Gayland Curry, FNP       calcium carbonate (TUMS - dosed in mg elemental calcium) chewable tablet 200 mg of elemental calcium  1 tablet Oral Daily PRN Starkes-Perry, Gayland Curry, FNP       cariprazine (VRAYLAR) capsule 3 mg  3 mg Oral Daily Suella Broad, FNP   3 mg at 07/05/21 0944   diclofenac Sodium (VOLTAREN) 1 % topical gel 2 g  2 g Topical QID PRN Suella Broad, FNP       escitalopram (LEXAPRO) tablet 10 mg  10 mg Oral Daily Suella Broad, FNP   10 mg at 07/05/21 0823   fluticasone furoate-vilanterol (BREO ELLIPTA) 200-25 MCG/ACT 1 puff  1 puff Inhalation Daily Suella Broad, FNP   1 puff at 07/05/21 0946   ibuprofen (ADVIL) tablet 400 mg  400 mg Oral Q6H PRN Suella Broad, FNP       LORazepam (ATIVAN) tablet 1 mg  1 mg Oral Q6H PRN Suella Broad, FNP   1 mg at 07/05/21 0827   Or   LORazepam (ATIVAN) injection 1 mg  1 mg Intramuscular Q6H PRN Starkes-Perry, Gayland Curry, FNP       risperiDONE (RISPERDAL M-TABS) disintegrating tablet 2 mg  2 mg Oral Q8H PRN Starkes-Perry, Gayland Curry, FNP       And   LORazepam (ATIVAN) tablet 1 mg  1 mg Oral PRN Starkes-Perry, Gayland Curry, FNP       And   ziprasidone (GEODON) injection 20 mg  20 mg Intramuscular PRN  Starkes-Perry, Gayland Curry, FNP       magnesium hydroxide (MILK OF MAGNESIA) suspension 30 mL  30 mL Oral Daily PRN Starkes-Perry, Gayland Curry, FNP       melatonin tablet 3 mg  3 mg Oral QHS Starkes-Perry, Gayland Curry, FNP       pantoprazole (PROTONIX) EC tablet 40 mg  40 mg Oral Daily Suella Broad, FNP   40 mg at 07/05/21 7026   polyethylene glycol (MIRALAX / GLYCOLAX) packet 17 g  17 g Oral Daily PRN Suella Broad, FNP       PTA Medications: Medications Prior to Admission  Medication Sig Dispense Refill Last Dose   albuterol (VENTOLIN HFA) 108 (90 Base) MCG/ACT inhaler Inhale 2 puffs into the lungs every 6 (six) hours as needed for wheezing or shortness of breath.      budesonide-formoterol (SYMBICORT) 160-4.5 MCG/ACT inhaler Inhale 2 puffs into the lungs 2 (two) times daily.      calcium carbonate (TUMS - DOSED IN MG ELEMENTAL CALCIUM) 500 MG chewable tablet Chew 1 tablet by mouth daily as needed for indigestion or heartburn.      diclofenac Sodium (VOLTAREN) 1 % GEL Apply 2 g topically 4 (four) times daily. 100 g 0  ibuprofen (ADVIL) 200 MG tablet Take 400-600 mg by mouth every 6 (six) hours as needed for moderate pain or headache.      melatonin 3 MG TABS tablet Take 3 mg by mouth at bedtime.      pantoprazole (PROTONIX) 40 MG tablet TAKE 1 TABLET BY MOUTH DAILY (Patient taking differently: 40 mg daily.) 30 tablet 0     Patient Stressors: Marital or family conflict   Other: Wants other outpatient services    Patient Strengths: Supportive family/friends  Work skills   Treatment Modalities: Medication Management, Group therapy, Case management,  1 to 1 session with clinician, Psychoeducation, Recreational therapy.   Physician Treatment Plan for Primary Diagnosis: Suicide attempt [facility]) Long Term Goal(s):     Short Term Goals:    Medication Management: Evaluate patient's response, side effects, and tolerance of medication regimen.  Therapeutic Interventions: 1 to 1  sessions, Unit Group sessions and Medication administration.  Evaluation of Outcomes: Not Met  Physician Treatment Plan for Secondary Diagnosis: Principal Problem:   Suicide attempt [facility])  Long Term Goal(s):     Short Term Goals:       Medication Management: Evaluate patient's response, side effects, and tolerance of medication regimen.  Therapeutic Interventions: 1 to 1 sessions, Unit Group sessions and Medication administration.  Evaluation of Outcomes: Not Met   RN Treatment Plan for Primary Diagnosis: Suicide attempt [facility]) Long Term Goal(s): Knowledge of disease and therapeutic regimen to maintain health will improve  Short Term Goals: Ability to remain free from injury will improve, Ability to participate in decision making will improve, Ability to verbalize feelings will improve, Ability to disclose and discuss suicidal ideas, and Ability to identify and develop effective coping behaviors will improve  Medication Management: RN will administer medications as ordered by provider, will assess and evaluate patient's response and provide education to patient for prescribed medication. RN will report any adverse and/or side effects to prescribing provider.  Therapeutic Interventions: 1 on 1 counseling sessions, Psychoeducation, Medication administration, Evaluate responses to treatment, Monitor vital signs and CBGs as ordered, Perform/monitor CIWA, COWS, AIMS and Fall Risk screenings as ordered, Perform wound care treatments as ordered.  Evaluation of Outcomes: Not Met   LCSW Treatment Plan for Primary Diagnosis: Suicide attempt [facility]) Long Term Goal(s): Safe transition to appropriate next level of care at discharge, Engage patient in therapeutic group addressing interpersonal concerns.  Short Term Goals: Engage patient in aftercare planning with referrals and resources, Increase social support, Increase emotional regulation, Facilitate acceptance of mental health diagnosis and  concerns, Identify triggers associated with mental health/substance abuse issues, and Increase skills for wellness and recovery  Therapeutic Interventions: Assess for all discharge needs, 1 to 1 time with Social worker, Explore available resources and support systems, Assess for adequacy in community support network, Educate family and significant other(s) on suicide prevention, Complete Psychosocial Assessment, Interpersonal group therapy.  Evaluation of Outcomes: Not Met   Progress in Treatment: Attending groups: No. Participating in groups: No. Taking medication as prescribed: Yes. Toleration medication: Yes. Family/Significant other contact made: No, will contact:  Robert Lee  Patient understands diagnosis: No. Discussing patient identified problems/goals with staff: Yes. Medical problems stabilized or resolved: Yes. Denies suicidal/homicidal ideation: Yes. Issues/concerns per patient self-inventory: No.   New problem(s) identified: No, Describe:  None   New Short Term/Long Term Goal(s): medication stabilization, elimination of SI thoughts, development of comprehensive mental wellness plan.   Patient Goals: "To get better"  Discharge Plan or Barriers: Patient recently admitted.  CSW will continue to follow and assess for appropriate referrals and possible discharge planning.   Reason for Continuation of Hospitalization: Anxiety Depression Medication stabilization Suicidal ideation Other; describe Paranoia   Estimated Length of Stay: 3 to 7 days   Last 3 [place] Suicide Severity Risk Score: [place] Admission (Current) from 07/04/2021 in [place] [facility] ED to Hosp-Admission (Discharged) from 07/01/2021 in [place] ED from 06/26/2021 in [place] No Risk High Risk High Risk       Last PHQ 2/9 Scores:    06/27/2021    3:18 AM 05/01/2021    2:16 AM  02/03/2020    2:24 AM  Depression screen PHQ 2/9  Decreased Interest _0 Down, Depressed, Hopeless _1 PHQ - 2 Score _2 Altered sleeping 0 0 3  Tired, decreased energy _3 Change in appetite 0 1 3  Feeling bad or failure about yourself  _4 Trouble concentrating _5 Moving slowly or fidgety/restless _6 Suicidal thoughts _7 PHQ-9 Score _8 Difficult doing work/chores Somewhat difficult Extremely dIfficult     Scribe for Treatment Team: Darleen Crocker, Latanya Presser 07/05/2021 11:14 AM

## 2021-07-05 NOTE — Group Note (Signed)
LCSW Group Therapy Note   Group Date: 07/05/2021 Start Time: 1300 End Time: 1400   Type of Therapy and Topic:  Group Therapy:  Strengths Exploration   Participation Level: Did Not Attend  Description of Group: This group allows individuals to explore their strengths, learn to use strengths in new ways to improve well-being. Strengths-based interventions involve identifying strengths, understanding how they are used, and learning new ways to apply them. Individuals will identify their strengths, and then explore their roles in different areas of life (relationships, professional life, and personal fulfillment). Individuals will think about ways in which they currently use their strengths, along with new ways they could begin using them.    Therapeutic Goals Patient will verbalize two of their strengths Patient will identify how their strengths are currently used Patient will identify two new ways to apply their strengths  Patients will create a plan to apply their strengths in their daily lives     Summary of Patient Progress:  Did not attend      Therapeutic Modalities Cognitive [place], Latanya Presser 07/05/2021  1:51 PM

## 2021-07-05 NOTE — Care Management Important Message (Signed)
Important Message  Patient Details  Name: [name] MRN: [id number] Date of Birth: 02-Jun-1990   Medicare Important Message Given:  Yes IM mailed to the patient home address     [name] 07/05/2021, 8:15 AM

## 2021-07-05 NOTE — H&P (Addendum)
Psychiatric Admission Assessment Adult  Patient Identification: [name] MRN:  [id number] Date of Evaluation:  07/05/2021 Chief Complaint:  Suicide attempt [facility]) [T14.91XA] Principal Diagnosis: Bipolar I disorder, current or most recent episode depressed, with psychotic features ([place]) Diagnosis:  Principal Problem:   Bipolar I disorder, current or most recent episode depressed, with psychotic features ([place]) Active Problems:   Borderline personality disorder ([place])   Autism   GAD (generalized anxiety disorder)   Suicide attempt ([place])  History of Present Illness:  Patient is a 31 year old female with a reported past psychiatric history of "intellectual disability, autism, bipolar disorder, depression, anxiety, and borderline personality disorder" who was admitted to the psychiatric hospital from [facility] after suicide attempt via overdose on 2 weeks of: propranolol (280 mg per medical record), vryalar '3mg'$  once daily, gabapentin 200 mg tid, lexapro 10 mg once daily, and protonix. Patient was treated and medically cleared at [facility] prior to transfer and admission to the psychiatric unit.  Reported home psychiatric medications: Vraylar 3 mg once daily, Lexapro 10 mg once daily, Vistaril 10 mg as needed, gabapentin 200 mg 3 times daily, propranolol.  On assessment today, the patient reports that prior to his current suicide attempt, she had attempted suicide by overdose the week prior.  She reports she had attempted suicide "because I was not being treated respectfully and no one would listen to me".  She reports multiple stressors contributing to worsening depressed mood, worsening depression, and paranoia including her ex-husband's family posting on Facebook that she killed him (patient states that this person overdosed on medication and died), other interpersonal conflicts and also financial stressors including leaving her job last week.  She reports having suicidal  thoughts for "a couple of days" prior to this most recent suicide attempt.  She reports worsening depressed mood, increased anxiety, increased paranoid thoughts for a couple of weeks.  On assessment today, the patient reports that her main concerns are symptoms of anxiety and paranoia.  She reports she feels paranoid that other people are out to get her, other people are talking about her and "other people have me scared".  When asked to elaborate further she states "I cannot say any more about this".  She is suspicious and guarded.  She reports her anxiety is excessive and is worsened secondary to paranoid thoughts. She reports that mood is depressed, overwhelmed, and anxious.  She reports having some anhedonia.  She reports poor sleep secondary to high level of anxiety and feeling scared.  She reports low energy, poor concentration.  Reports appetite is okay.  At this time she denies having any suicidal thoughts.  She denies having any homicidal thoughts.  She does report having panic attacks that come and go, sometimes occurring once daily. She reports most recent manic episode was in March 2023.  She reports she was started on Vraylar around that time, which has helped stabilize her mood since then, and she has not had recurrence of manic episodes since starting Vraylar. She reports having auditory hallucinations, off and on, "it is God.  He is talking to me.  Actually I do not know if he is really talking to me I can feel his presence".  She denies having any visual hallucinations.  She does report having symptoms of thought control, that coincide with increased paranoia.  Denies having any symptoms of ideas of reference.   Past psychiatric history: Diagnosis: Reported "intellectual disability, autism, bipolar disorder, depression, anxiety, borderline personality disorder" Patient reports history  of multiple psychiatric hospitalizations Patient reports history of multiple suicide attempts Current  medications: See above Past psychiatric medication history: " I have taken so many I cannot recall.  But I do recall that Tegretol made blood come out of my mouth".   Past medical history: Asthma, arthritis Denies history of seizures Surgical history: Oral surgery Allergies: Multiple, see medical record  Social history: Born in [place], raised in [place], lives in [place].  Widowed.  1 child that is 67 years old.  Stopped working last week.  Family history: Psychiatric family history: Father: Depression.  Brother: Autism.  Patient reports father and 2 uncles attempted suicide.  Substance use history: Rare alcohol use.  Denies tobacco or nicotine use.  Denies other illicit drug use.   Total Time spent with patient: 45 minutes   Is the patient at risk to self? Yes.    Has the patient been a risk to self in the past 6 months? Yes.    Has the patient been a risk to self within the distant past? Yes.    Is the patient a risk to others? No.  Has the patient been a risk to others in the past 6 months? No.  Has the patient been a risk to others within the distant past? No.   Prior Inpatient Therapy:   Prior Outpatient Therapy:    Alcohol Screening: 1. How often do you have a drink containing alcohol?: Never 2. How many drinks containing alcohol do you have on a typical day when you are drinking?: 1 or 2 3. How often do you have six or more drinks on one occasion?: Never AUDIT-C Score: 0 4. How often during the last year have you found that you were not able to stop drinking once you had started?: Never 5. How often during the last year have you failed to do what was normally expected from you because of drinking?: Never 6. How often during the last year have you needed a first drink in the morning to get yourself going after a heavy drinking session?: Never 7. How often during the last year have you had a feeling of guilt of remorse after drinking?: Never 8. How often during the  last year have you been unable to remember what happened the night before because you had been drinking?: Never 9. Have you or someone else been injured as a result of your drinking?: No 10. Has a relative or friend or a doctor or another health worker been concerned about your drinking or suggested you cut down?: No Alcohol Use Disorder Identification Test Final Score (AUDIT): 0 Alcohol Brief Interventions/Follow-up: Patient Refused Substance Abuse History in the last 12 months:  No. Consequences of Substance Abuse: NA Previous Psychotropic Medications: Yes  Psychological Evaluations: Yes  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 fragile X syndrome    Chronic constipation    Depression    Drug-seeking behavior    Essential tremor    Headache    Overdose of acetaminophen 07/2017   and other meds   Personality disorder ([place])    Schizo-affective psychosis ([place])    Schizoaffective disorder, bipolar type ([place])    Seizures ([place])    Last seizure December 2017   Sleep apnea     Past Surgical History:  Procedure Laterality Date  MOUTH SURGERY  2009 or 2010   Family History:  Family History  Problem Relation Age of Onset   Mental illness Father    Asthma Father    PDD Brother    Seizures Brother     Tobacco Screening: Denies  Social History:  Social History   Substance and Sexual Activity  Alcohol Use No   Alcohol/week: 1.0 standard drink   Types: 1 Standard drinks or equivalent per week   Comment: denies at this time     Social History   Substance and Sexual Activity  Drug Use No   Comment: History of cocaine use at age 4 for 4 months    Additional Social History: Marital status: Widowed Widowed, when?: Pt states her husband killed himself in 2019. Are you sexually active?: No What is your sexual orientation?: heterosexual Has your sexual activity  been affected by drugs, alcohol, medication, or emotional stress?: No Does patient have children?: Yes How many children?: 1 How is patient's relationship with their children?: "I have a 9yo daughter but she does not live with me and I cannot see her right now because I have limited transportation"                         Allergies:   Allergies  Allergen Reactions   Bee Venom Anaphylaxis   Coconut Flavor Anaphylaxis and Rash   Geodon [Ziprasidone Hcl] Other (See Comments)    Pt states that this medication causes paralysis of the mouth.     Haloperidol And Related Other (See Comments)    Pt states that this medication causes paralysis of the mouth, jaw locks up   Lithium Other (See Comments)    Seizure-like activity    Oxycodone Other (See Comments)    Hallucinations    Quetiapine Other (See Comments)    Pt states that this medication is too strong.    Shellfish Allergy Anaphylaxis   Phenergan [Promethazine Hcl] Other (See Comments)    Chest pain     Prilosec [Omeprazole] Nausea And Vomiting   Sulfa Antibiotics Other (See Comments)    Chest pain    Tegretol [Carbamazepine] Nausea And Vomiting   Prozac [Fluoxetine] Other (See Comments)    Increased Depression , Suicidal thoughts   Tape Other (See Comments)    Skin tears, can only tolerate paper tape.   Tylenol [Acetaminophen]     Unspecified reaction   Lab Results:  Results for orders placed or performed during the hospital encounter of 07/01/21 (from the past 48 hour(s))  Glucose, capillary     Status: Abnormal   Collection Time: 07/03/21  7:48 PM  Result Value Ref Range   Glucose-Capillary 104 (H) 70 - 99 mg/dL    Comment: Glucose reference range applies only to samples taken after fasting for at least 8 hours.  Basic metabolic panel     Status: Abnormal   Collection Time: 07/03/21  8:00 PM  Result Value Ref Range   Sodium 139 135 - 145 mmol/L   Potassium 4.0 3.5 - 5.1 mmol/L   Chloride 111 98 - 111 mmol/L    CO2 22 22 - 32 mmol/L   Glucose, Bld 144 (H) 70 - 99 mg/dL    Comment: Glucose reference range applies only to samples taken after fasting for at least 8 hours.   BUN 5 (L) 6 - 20 mg/dL   Creatinine, Ser 0.74 0.44 - 1.00 mg/dL   Calcium 8.4 (L) 8.9 -  10.3 mg/dL   GFR, Estimated >60 >60 mL/min    Comment: (NOTE) Calculated using the CKD-EPI Creatinine Equation (2021)    Anion gap 6 5 - 15    Comment: Performed at [facility], [place] [address], [place], [place] [address]  Glucose, capillary     Status: None   Collection Time: 07/04/21  5:19 AM  Result Value Ref Range   Glucose-Capillary 93 70 - 99 mg/dL    Comment: Glucose reference range applies only to samples taken after fasting for at least 8 hours.  Basic metabolic panel     Status: Abnormal   Collection Time: 07/04/21  5:34 AM  Result Value Ref Range   Sodium 138 135 - 145 mmol/L   Potassium 4.1 3.5 - 5.1 mmol/L   Chloride 108 98 - 111 mmol/L   CO2 23 22 - 32 mmol/L   Glucose, Bld 94 70 - 99 mg/dL    Comment: Glucose reference range applies only to samples taken after fasting for at least 8 hours.   BUN 8 6 - 20 mg/dL   Creatinine, Ser 0.77 0.44 - 1.00 mg/dL   Calcium 8.8 (L) 8.9 - 10.3 mg/dL   GFR, Estimated >60 >60 mL/min    Comment: (NOTE) Calculated using the CKD-EPI Creatinine Equation (2021)    Anion gap 7 5 - 15    Comment: Performed at [facility] [address], [place], [place] [address]  Magnesium     Status: None   Collection Time: 07/04/21  5:34 AM  Result Value Ref Range   Magnesium 1.9 1.7 - 2.4 mg/dL    Comment: Performed at [facility] [address], [place], [place] [address]  SARS Coronavirus 2 by RT PCR (hospital order, performed in [facility] hospital lab) *cepheid single result test* Anterior Nasal Swab     Status: None   Collection Time: 07/04/21  1:43 PM   Specimen: Anterior Nasal Swab  Result Value Ref Range   SARS Coronavirus 2 by RT PCR NEGATIVE NEGATIVE    Comment:  (NOTE) SARS-CoV-2 target nucleic acids are NOT DETECTED.  The SARS-CoV-2 RNA is generally detectable in upper and lower respiratory specimens during the acute phase of infection. The lowest concentration of SARS-CoV-2 viral copies this assay can detect is 250 copies / mL. A negative result does not preclude SARS-CoV-2 infection and should not be used as the sole basis for treatment or other patient management decisions.  A negative result may occur with improper specimen collection / handling, submission of specimen other than nasopharyngeal swab, presence of viral mutation(s) within the areas targeted by this assay, and inadequate number of viral copies (<250 copies / mL). A negative result must be combined with clinical observations, patient history, and epidemiological information.  Fact Sheet for Patients:   https://www.patel.info/  Fact Sheet for Healthcare Providers: https://hall.com/  This test is not yet approved or  cleared by the [place] FDA and has been authorized for detection and/or diagnosis of SARS-CoV-2 by FDA under an Emergency Use Authorization (EUA).  This EUA will remain in effect (meaning this test can be used) for the duration of the COVID-19 declaration under Section 564(b)(1) of the Act, 21 U.S.C. section 360bbb-3(b)(1), unless the authorization is terminated or revoked sooner.  Performed at [facility], [place] [address], [place], [place] [address]     Blood Alcohol level:  Lab Results  Component Value Date   [facility] <10 07/01/2021  ETH <10 02/40/9735    Metabolic Disorder Labs:  Lab Results  Component Value Date   HGBA1C 5.1 03/02/2021   MPG 99.67 03/02/2021   MPG 96.8 07/02/2019   Lab Results  Component Value Date   PROLACTIN 66.2 (H) 03/02/2021   PROLACTIN 66.1 (H) 02/04/2020   Lab Results  Component Value Date   CHOL 245 (H) 05/01/2021   TRIG 169 (H) 05/01/2021   HDL 46 05/01/2021    CHOLHDL 5.3 05/01/2021   VLDL 34 05/01/2021   LDLCALC 165 (H) 05/01/2021   LDLCALC 102 (H) 04/24/2021    Current Medications: Current Facility-Administered Medications  Medication Dose Route Frequency Provider Last Rate Last Admin   acetaminophen (TYLENOL) tablet 650 mg  650 mg Oral Q6H PRN Starkes-Perry, Gayland Curry, FNP       albuterol (PROVENTIL) (2.5 MG/3ML) 0.083% nebulizer solution 2.5 mg  2.5 mg Inhalation Q6H PRN Starkes-Perry, Gayland Curry, FNP       alum & mag hydroxide-simeth (MAALOX/MYLANTA) 200-200-20 MG/5ML suspension 30 mL  30 mL Oral Q4H PRN Starkes-Perry, Gayland Curry, FNP       calcium carbonate (TUMS - dosed in mg elemental calcium) chewable tablet 200 mg of elemental calcium  1 tablet Oral Daily PRN Starkes-Perry, Gayland Curry, FNP       cariprazine (VRAYLAR) capsule 3 mg  3 mg Oral Daily Suella Broad, FNP   3 mg at 07/05/21 0944   diclofenac Sodium (VOLTAREN) 1 % topical gel 2 g  2 g Topical QID PRN Suella Broad, FNP       escitalopram (LEXAPRO) tablet 10 mg  10 mg Oral Daily Suella Broad, FNP   10 mg at 07/05/21 0823   fluticasone furoate-vilanterol (BREO ELLIPTA) 200-25 MCG/ACT 1 puff  1 puff Inhalation Daily Suella Broad, FNP   1 puff at 07/05/21 0946   gabapentin (NEURONTIN) capsule 300 mg  300 mg Oral TID Janine Limbo, MD   300 mg at 07/05/21 1651   hydrOXYzine (ATARAX) tablet 25 mg  25 mg Oral TID PRN Janine Limbo, MD   25 mg at 07/05/21 1652   ibuprofen (ADVIL) tablet 400 mg  400 mg Oral Q6H PRN Suella Broad, FNP       LORazepam (ATIVAN) tablet 1 mg  1 mg Oral Q6H PRN Suella Broad, FNP   1 mg at 07/05/21 0827   risperiDONE (RISPERDAL M-TABS) disintegrating tablet 2 mg  2 mg Oral Q8H PRN Suella Broad, FNP       And   LORazepam (ATIVAN) tablet 1 mg  1 mg Oral PRN Starkes-Perry, Gayland Curry, FNP       And   ziprasidone (GEODON) injection 20 mg  20 mg Intramuscular PRN Starkes-Perry, Gayland Curry, FNP        magnesium hydroxide (MILK OF MAGNESIA) suspension 30 mL  30 mL Oral Daily PRN Starkes-Perry, Gayland Curry, FNP       melatonin tablet 3 mg  3 mg Oral QHS Starkes-Perry, Gayland Curry, FNP       pantoprazole (PROTONIX) EC tablet 40 mg  40 mg Oral Daily Suella Broad, FNP   40 mg at 07/05/21 3299   polyethylene glycol (MIRALAX / GLYCOLAX) packet 17 g  17 g Oral Daily PRN Suella Broad, FNP       traZODone (DESYREL) tablet 50 mg  50 mg Oral QHS PRN Satya Buttram, Ovid Curd, MD       PTA Medications: Medications Prior to Admission  Medication Sig  Dispense Refill Last Dose   albuterol (VENTOLIN HFA) 108 (90 Base) MCG/ACT inhaler Inhale 2 puffs into the lungs every 6 (six) hours as needed for wheezing or shortness of breath.      budesonide-formoterol (SYMBICORT) 160-4.5 MCG/ACT inhaler Inhale 2 puffs into the lungs 2 (two) times daily.      calcium carbonate (TUMS - DOSED IN MG ELEMENTAL CALCIUM) 500 MG chewable tablet Chew 1 tablet by mouth daily as needed for indigestion or heartburn.      diclofenac Sodium (VOLTAREN) 1 % GEL Apply 2 g topically 4 (four) times daily. 100 g 0    ibuprofen (ADVIL) 200 MG tablet Take 400-600 mg by mouth every 6 (six) hours as needed for moderate pain or headache.      melatonin 3 MG TABS tablet Take 3 mg by mouth at bedtime.      pantoprazole (PROTONIX) 40 MG tablet TAKE 1 TABLET BY MOUTH DAILY (Patient taking differently: 40 mg daily.) 30 tablet 0     Musculoskeletal: Strength & Muscle Tone: within normal limits Gait & Station: normal Patient leans: N/A            Psychiatric Specialty Exam:  Presentation  General Appearance: Disheveled  Eye Contact:Minimal  Speech:Normal Rate  Speech Volume:Normal  Handedness:Right   Mood and Affect  Mood:Anxious; Depressed; Dysphoric  Affect:Congruent; Labile   Thought Process  Thought Processes:Linear  Duration of Psychotic Symptoms: Greater than six months  Past Diagnosis of Schizophrenia or  Psychoactive disorder: -- (unlcear per medical record vs pt report)  Descriptions of Associations:Intact  Orientation:Full (Time, Place and Person)  Thought Content:Logical  Hallucinations:Hallucinations: Auditory  Ideas of Reference:Paranoia  Suicidal Thoughts:Suicidal Thoughts: No  Homicidal Thoughts:Homicidal Thoughts: No   Sensorium  Memory:Immediate Good; Recent Good; Remote Good  Judgment:Poor  Insight:Poor   Executive Functions  Concentration:Poor  Attention Span:Poor  [place] of Knowledge:Good  Language:Good   Psychomotor Activity  Psychomotor Activity:Psychomotor Activity: Restlessness  Assets  Assets:Communication Skills; Housing; Desire for Improvement; Physical Health; Social Support; Leisure Time   Sleep  Sleep:Sleep: Poor   Physical Exam: Physical Exam Vitals reviewed.  Pulmonary:     Effort: Pulmonary effort is normal.   Review of Systems  Psychiatric/Behavioral:  Positive for depression, hallucinations and suicidal ideas. The patient is nervous/anxious and has insomnia.    Blood pressure (!) 120/106, pulse (!) 112, temperature 98.9 F (37.2 C), temperature source Oral, resp. rate 18, height '5\' 6"'$  (1.676 m), weight 93.4 kg, SpO2 100 %. Body mass index is 33.25 kg/m.  Treatment Plan Summary: Daily contact with patient to assess and evaluate symptoms and progress in treatment  ASSESSMENT:  Diagnoses / Active Problems: -Bipolar disorder with psychotic features, current episode is depressed -GAD -History of autism -Reported borderline personality disorder -History of multiple suicide attempts   PLAN: Safety and Monitoring:  -- Involuntary admission to inpatient psychiatric unit for safety, stabilization and treatment  -- Daily contact with patient to assess and evaluate symptoms and progress in treatment  -- Patient's case to be discussed in multi-disciplinary team meeting  -- Observation Level : q15 minute  checks  -- Vital signs:  q12 hours  -- Precautions: suicide, elopement, and assault  2. Psychiatric Diagnoses and Treatment:    -hold vraylar. OD was on 5-27 and half life of vraylar is 2-4 days.  Increased anxiety and restlessness could be 2/2 akathesia after overdose  -continue lexapro 10 mg once daily -restart and increase gabapentin to 300 mg tid  -continue  ativan 1 mg PRN  -hold propranolol -increase vistaril to 25 mg tid for increased anxiety  -restart propanolol 10 mg bid for tachycardia and HTN  --  The risks/benefits/side-effects/alternatives to this medication were discussed in detail with the patient and time was given for questions. The patient consents to medication trial.    -- Metabolic profile and EKG monitoring obtained while on an atypical antipsychotic (BMI: Lipid Panel: HbgA1c: QTc:) 479  -- Encouraged patient to participate in unit milieu and in scheduled group therapies   -- Short Term Goals: Ability to identify changes in lifestyle to reduce recurrence of condition will improve, Ability to verbalize feelings will improve, Ability to disclose and discuss suicidal ideas, Ability to demonstrate self-control will improve, Ability to identify and develop effective coping behaviors will improve, Ability to maintain clinical measurements within normal limits will improve, Compliance with prescribed medications will improve, and Ability to identify triggers associated with substance abuse/mental health issues will improve  -- Long Term Goals: Improvement in symptoms so as ready for discharge    3. Medical Issues Being Addressed:   Tobacco Use Disorder  -- Nicotine patch '21mg'$ /24 hours ordered  -- Smoking cessation encouraged  4. Discharge Planning:   -- Social work and case management to assist with discharge planning and identification of hospital follow-up needs prior to discharge  -- Estimated LOS: 5-7 days  -- Discharge Concerns: Need to establish a safety plan;  Medication compliance and effectiveness  -- Discharge Goals: Return home with outpatient referrals for mental health follow-up including medication management/psychotherapy  Observation Level/Precautions:  15 minute checks  Laboratory:  see above   Psychotherapy:    Medications:    Consultations:    Discharge Concerns:    Estimated LOS: 5-7 days   Other:     Physician Treatment Plan for Primary Diagnosis: Bipolar I disorder, current or most recent episode depressed, with psychotic features [facility])  Physician Treatment Plan for Secondary Diagnosis: Principal Problem:   Bipolar I disorder, current or most recent episode depressed, with psychotic features ([place]) Active Problems:   Borderline personality disorder ([place])   Autism   GAD (generalized anxiety disorder)   Suicide attempt ([place])   I certify that inpatient services furnished can reasonably be expected to improve the patient's condition.    Christoper Allegra, MD 5/31/20235:13 PM  Total Time Spent in Direct Patient Care:  I personally spent 60 minutes on the unit in direct patient care. The direct patient care time included face-to-face time with the patient, reviewing the patient's chart, communicating with other professionals, and coordinating care. Greater than 50% of this time was spent in counseling or coordinating care with the patient regarding goals of hospitalization, psycho-education, and discharge planning needs.   Janine Limbo, MD Psychiatrist

## 2021-07-05 NOTE — Progress Notes (Signed)
Provider made aware of patient vital signs. Pt restless and fidgety and constantly moving when attempting to take vital signs. Pt paranoid and currently refused EKG. Pt stated "I do not trust yall, don't nobody touch me."   07/04/21 2330  Vital Signs  Temp 98 F (36.7 C)  Temp Source Oral  Pulse Rate (!) 142  Pulse Rate Source Dinamap  Resp 20  BP (!) 110/91  BP Location Left Arm  BP Method Automatic  Patient Position (if appropriate) Standing  Oxygen Therapy  SpO2 100 %  O2 Device Room Air  Pain Assessment  Pain Scale 0-10  Pain Score 0
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
## 2021-07-05 NOTE — Plan of Care (Signed)
Patient has stayed in her room. Flat and depressed, not willing to discuss her concerns. Suspicious and seeking attention with childlike behaviors. Contracts for safety. Patient refused her bedtime medication (Melatonin). Was encouraged to inform staff if she changes her mind.

## 2021-07-06 MED ORDER — QUETIAPINE FUMARATE 50 MG PO TABS
50.0000 mg | ORAL_TABLET | Freq: Every day | ORAL | Status: DC
  Administered 2021-07-06 – 2021-07-10 (×5): 50 mg via ORAL
  Filled 2021-07-06 (×8): qty 1

## 2021-07-06 NOTE — Progress Notes (Signed)
Adult Psychoeducational Group Note  Date:  07/06/2021 Time:  10:02 PM  Group Topic/Focus:  Wrap-Up Group:   The focus of this group is to help patients review their daily goal of treatment and discuss progress on daily workbooks.  Participation Level:  Active  Participation Quality:  Appropriate  Affect:  Appropriate  Cognitive:  Appropriate  Insight: Appropriate  Engagement in Group:  Engaged  Modes of Intervention:  Discussion  Additional Comments:  Pt goal for the day was to be at peace. Pt met goal.  Tonia Brooms D 07/06/2021, 10:02 PM

## 2021-07-06 NOTE — Progress Notes (Signed)
Pt stated she was doing better, pt has been visible on the unit this evening, pt had appropriate interaction with peers and staff this evening . Pt stated she needs to pay a bill from her phone, pt encouraged to notify SW to get this taken care of before the due date     07/06/21 2100  Psych Admission Type (Psych Patients Only)  Admission Status Involuntary  Psychosocial Assessment  Patient Complaints Anxiety;Depression  Eye Contact Fair  Facial Expression Sad  Affect Depressed  Speech Soft  Interaction Assertive  Motor Activity Slow  Appearance/Hygiene Unremarkable  Behavior Characteristics Cooperative  Mood Pleasant;Depressed  Aggressive Behavior  Effect No apparent injury  Thought Process  Coherency Circumstantial  Content Blaming others  Delusions WDL  Perception WDL  Hallucination None reported or observed  Judgment Poor  Confusion None  Danger to Self  Current suicidal ideation? Denies

## 2021-07-06 NOTE — Progress Notes (Signed)
Psychiatric Progress Note  Patient Identification: [name] MRN:  [id number] Date of Evaluation:  07/06/2021 Chief Complaint:  Suicide attempt [facility]) [T14.91XA] Principal Diagnosis: Bipolar I disorder, current or most recent episode depressed, with psychotic features ([place]) Diagnosis:  Principal Problem:   Bipolar I disorder, current or most recent episode depressed, with psychotic features ([place]) Active Problems:   Borderline personality disorder ([place])   Autism   GAD (generalized anxiety disorder)   Suicide attempt ([place])  History of Present Illness:  Patient is a 31 year old female with a reported past psychiatric history of "intellectual disability, autism, bipolar disorder, depression, anxiety, and borderline personality disorder" who was admitted to the psychiatric hospital from [facility] after suicide attempt via overdose on 2 weeks of: propranolol (280 mg per medical record), vryalar '3mg'$  once daily, gabapentin 200 mg tid, lexapro 10 mg once daily, and protonix. Patient was treated and medically cleared at [facility] prior to transfer and admission to the psychiatric unit.  Yesterday's Recommendations:  -Hold vraylar. OD was on 5-27 and half life of vraylar is 2-4 days.  -continue lexapro 10 mg once daily -restart and increase gabapentin to 300 mg tid  -continue ativan 1 mg PRN  -hold propranolol -increase vistaril to 25 mg tid for increased anxiety  -restart propanolol 10 mg bid for tachycardia and HTN   On assessment today, patient was lying in bed resting. She said that she was feeling "normal, but a little low." She added that she was feeling paranoid, but said she would not elaborate because she's "a little scared." She said that the paranoid feelings are distressing and making her feel anxious. She refused to elaborate on these feelings or their cause. When asked why she does not want to share, she said that she "didn't want to get herself into trouble."  Later in the interview, she revealed that she is still paranoid about what her ex-husband's family is saying about her on Facebook. She says that they are accusing her of his death and it feels like the story is out of her control. She reports that her ex-husband's family and her own family don't treat her right. She said she does not want to let us wash her clothes because she is concerned that we will steal them. She rates her anxiety as a 7/10 and her depression as a 2/10. She said that she feels "fidgety" because of her anxiety.   She reports that her appetite is "so-so" but she doesn't want to eat much because she's a little scared. She would not explain why she is scared but said that she usually drinks protein shakes at home and would be more willing to eat something similar here. She said that she slept throughout the night.   She reports that she is having auditory hallucinations but refused to explain further. She endorses paranoia. She denies VH, SI, or HI. She denies thought insertion, thought removal.   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 fragile X syndrome    Chronic constipation    Depression    Drug-seeking behavior    Essential tremor    Headache    Overdose of acetaminophen 07/2017   and other meds   Personality disorder ([place])    Schizo-affective psychosis ([place])    Schizoaffective disorder, bipolar type ([place])  Seizures ([name])    Last seizure December 2017   Sleep apnea     Past Surgical History:  Procedure Laterality Date   MOUTH SURGERY  2009 or 2010   Allergies:   Allergies  Allergen Reactions   Bee Venom Anaphylaxis   Coconut Flavor Anaphylaxis and Rash   Geodon [Ziprasidone Hcl] Other (See Comments)    Pt states that this medication causes paralysis of the mouth.     Haloperidol And Related Other (See Comments)    Pt states that this  medication causes paralysis of the mouth, jaw locks up   Lithium Other (See Comments)    Seizure-like activity    Oxycodone Other (See Comments)    Hallucinations    Quetiapine Other (See Comments)    Pt states that this medication is too strong.    Shellfish Allergy Anaphylaxis   Phenergan [Promethazine Hcl] Other (See Comments)    Chest pain     Prilosec [Omeprazole] Nausea And Vomiting   Sulfa Antibiotics Other (See Comments)    Chest pain    Tegretol [Carbamazepine] Nausea And Vomiting   Prozac [Fluoxetine] Other (See Comments)    Increased Depression , Suicidal thoughts   Tape Other (See Comments)    Skin tears, can only tolerate paper tape.   Tylenol [Acetaminophen]     Unspecified reaction    Family History:  Family History  Problem Relation Age of Onset   Mental illness Father    Asthma Father    PDD Brother    Seizures Brother    Social History:  Social History   Substance and Sexual Activity  Alcohol Use No   Alcohol/week: 1.0 standard drink   Types: 1 Standard drinks or equivalent per week   Comment: denies at this time     Social History   Substance and Sexual Activity  Drug Use No   Comment: History of cocaine use at age 32 for 4 months    Additional Social History: Marital status: Widowed Widowed, when?: Pt states her husband killed himself in 2019. Are you sexually active?: No What is your sexual orientation?: heterosexual Has your sexual activity been affected by drugs, alcohol, medication, or emotional stress?: No Does patient have children?: Yes How many children?: 1 How is patient's relationship with their children?: "I have a 55yo daughter but she does not live with me and I cannot see her right now because I have limited transportation"    Lab Results:  Results for orders placed or performed during the hospital encounter of 07/01/21 (from the past 48 hour(s))  SARS Coronavirus 2 by RT PCR (hospital order, performed in [facility]  hospital lab) *cepheid single result test* Anterior Nasal Swab     Status: None   Collection Time: 07/04/21  1:43 PM   Specimen: Anterior Nasal Swab  Result Value Ref Range   SARS Coronavirus 2 by RT PCR NEGATIVE NEGATIVE    Comment: (NOTE) SARS-CoV-2 target nucleic acids are NOT DETECTED.  The SARS-CoV-2 RNA is generally detectable in upper and lower respiratory specimens during the acute phase of infection. The lowest concentration of SARS-CoV-2 viral copies this assay can detect is 250 copies / mL. A negative result does not preclude SARS-CoV-2 infection and should not be used as the sole basis for treatment or other patient management decisions.  A negative result may occur with improper specimen collection / handling, submission of specimen other than nasopharyngeal swab, presence of viral mutation(s) within the areas targeted by this assay,  and inadequate number of viral copies (<250 copies / mL). A negative result must be combined with clinical observations, patient history, and epidemiological information.  Fact Sheet for Patients:   https://www.patel.info/  Fact Sheet for Healthcare Providers: https://hall.com/  This test is not yet approved or  cleared by the [place] FDA and has been authorized for detection and/or diagnosis of SARS-CoV-2 by FDA under an Emergency Use Authorization (EUA).  This EUA will remain in effect (meaning this test can be used) for the duration of the COVID-19 declaration under Section 564(b)(1) of the Act, 21 U.S.C. section 360bbb-3(b)(1), unless the authorization is terminated or revoked sooner.  Performed at [facility], [place] [address] [place], [place] [address]     Blood Alcohol level:  Lab Results  Component Value Date   [facility] <10 07/01/2021   ETH <10 60/11/9321    Metabolic Disorder Labs:  Lab Results  Component Value Date   HGBA1C 5.1 03/02/2021   MPG 99.67 03/02/2021    MPG 96.8 07/02/2019   Lab Results  Component Value Date   PROLACTIN 66.2 (H) 03/02/2021   PROLACTIN 66.1 (H) 02/04/2020   Lab Results  Component Value Date   CHOL 245 (H) 05/01/2021   TRIG 169 (H) 05/01/2021   HDL 46 05/01/2021   CHOLHDL 5.3 05/01/2021   VLDL 34 05/01/2021   LDLCALC 165 (H) 05/01/2021   LDLCALC 102 (H) 04/24/2021    Current Medications: Current Facility-Administered Medications  Medication Dose Route Frequency Provider Last Rate Last Admin   acetaminophen (TYLENOL) tablet 650 mg  650 mg Oral Q6H PRN Starkes-Perry, Gayland Curry, FNP       albuterol (PROVENTIL) (2.5 MG/3ML) 0.083% nebulizer solution 2.5 mg  2.5 mg Inhalation Q6H PRN Starkes-Perry, Gayland Curry, FNP       alum & mag hydroxide-simeth (MAALOX/MYLANTA) 200-200-20 MG/5ML suspension 30 mL  30 mL Oral Q4H PRN Starkes-Perry, Gayland Curry, FNP       calcium carbonate (TUMS - dosed in mg elemental calcium) chewable tablet 200 mg of elemental calcium  1 tablet Oral Daily PRN Starkes-Perry, Gayland Curry, FNP       diclofenac Sodium (VOLTAREN) 1 % topical gel 2 g  2 g Topical QID PRN Starkes-Perry, Gayland Curry, FNP       escitalopram (LEXAPRO) tablet 10 mg  10 mg Oral Daily Suella Broad, FNP   10 mg at 07/06/21 0836   feeding supplement (ENSURE ENLIVE / ENSURE PLUS) liquid 237 mL  237 mL Oral BID BM Doreen Garretson, MD       fluticasone furoate-vilanterol (BREO ELLIPTA) 200-25 MCG/ACT 1 puff  1 puff Inhalation Daily Suella Broad, FNP   1 puff at 07/05/21 0946   gabapentin (NEURONTIN) capsule 300 mg  300 mg Oral TID Janine Limbo, MD   300 mg at 07/06/21 0836   hydrOXYzine (ATARAX) tablet 25 mg  25 mg Oral TID PRN Janine Limbo, MD   25 mg at 07/06/21 0048   ibuprofen (ADVIL) tablet 400 mg  400 mg Oral Q6H PRN Suella Broad, FNP       LORazepam (ATIVAN) tablet 1 mg  1 mg Oral Q6H PRN Suella Broad, FNP   1 mg at 07/05/21 0827   risperiDONE (RISPERDAL M-TABS) disintegrating tablet 2 mg  2  mg Oral Q8H PRN Suella Broad, FNP       And   LORazepam (ATIVAN) tablet 1 mg  1 mg Oral PRN Starkes-Perry, Gayland Curry, FNP  And   ziprasidone (GEODON) injection 20 mg  20 mg Intramuscular PRN Starkes-Perry, Gayland Curry, FNP       magnesium hydroxide (MILK OF MAGNESIA) suspension 30 mL  30 mL Oral Daily PRN Starkes-Perry, Gayland Curry, FNP       melatonin tablet 3 mg  3 mg Oral QHS Starkes-Perry, Gayland Curry, FNP       pantoprazole (PROTONIX) EC tablet 40 mg  40 mg Oral Daily Suella Broad, FNP   40 mg at 07/06/21 0836   polyethylene glycol (MIRALAX / GLYCOLAX) packet 17 g  17 g Oral Daily PRN Suella Broad, FNP       propranolol (INDERAL) tablet 10 mg  10 mg Oral BID Janine Limbo, MD   10 mg at 07/06/21 0836   traZODone (DESYREL) tablet 50 mg  50 mg Oral QHS PRN Ravleen Ries, Ovid Curd, MD       PTA Medications: Medications Prior to Admission  Medication Sig Dispense Refill Last Dose   albuterol (VENTOLIN HFA) 108 (90 Base) MCG/ACT inhaler Inhale 2 puffs into the lungs every 6 (six) hours as needed for wheezing or shortness of breath.      budesonide-formoterol (SYMBICORT) 160-4.5 MCG/ACT inhaler Inhale 2 puffs into the lungs 2 (two) times daily.      calcium carbonate (TUMS - DOSED IN MG ELEMENTAL CALCIUM) 500 MG chewable tablet Chew 1 tablet by mouth daily as needed for indigestion or heartburn.      diclofenac Sodium (VOLTAREN) 1 % GEL Apply 2 g topically 4 (four) times daily. 100 g 0    ibuprofen (ADVIL) 200 MG tablet Take 400-600 mg by mouth every 6 (six) hours as needed for moderate pain or headache.      melatonin 3 MG TABS tablet Take 3 mg by mouth at bedtime.      pantoprazole (PROTONIX) 40 MG tablet TAKE 1 TABLET BY MOUTH DAILY (Patient taking differently: 40 mg daily.) 30 tablet 0     Musculoskeletal: Strength & Muscle Tone: lying in bed Gait & Station: lying in bed Patient leans: lying in bed    Psychiatric Specialty Exam:  Presentation  General  Appearance: Disheveled  Eye Contact:Minimal  Speech:Normal Rate  Speech Volume: Quiet  Handedness:Right   Mood and Affect  Mood:Anxious; Depressed; Dysphoric  Affect:Congruent; Constricted   Thought Process  Thought Processes:Linear  Duration of Psychotic Symptoms: Greater than six months  Past Diagnosis of Schizophrenia or Psychoactive disorder: -- (unlcear per medical record vs pt report)  Descriptions of Associations:Intact  Orientation:Full (Time, Place and Person)  Thought Content:Logical  Hallucinations:Hallucinations: Auditory  Ideas of Reference:Paranoia  Suicidal Thoughts:Suicidal Thoughts: No  Homicidal Thoughts:Homicidal Thoughts: No   Sensorium  Memory:Immediate Good; Recent Good; Remote Good  Judgment:Poor  Insight:Poor   Executive Functions  Concentration:Poor  Attention Span:Poor  [place] of Knowledge:Good  Language:Good   Psychomotor Activity  Psychomotor Activity:Psychomotor Activity: Restlessness  Assets  Assets:Communication Skills; Housing; Desire for Improvement; Physical Health; Social Support; Leisure Time   Sleep  Sleep: Good - "I slept through the whole night"   Physical Exam: Vitals reviewed.  Pulmonary:     Effort: Pulmonary effort is normal.   Review of Systems  Psychiatric/Behavioral:  Positive for depression, hallucinations and suicidal ideas. The patient is nervous/anxious and has insomnia.    Blood pressure 106/68, pulse 78, temperature 98.9 F (37.2 C), temperature source Oral, resp. rate 16, height '5\' 6"'$  (1.676 m), weight 93.4 kg, SpO2 98 %. Body mass index is 33.25 kg/m.  Treatment Plan Summary: Daily contact with patient to assess and evaluate symptoms and progress in treatment  ASSESSMENT:  Diagnoses / Active Problems: -Bipolar disorder with psychotic features, current episode is depressed -GAD -History of autism -Reported borderline personality disorder -History of multiple  suicide attempts   PLAN: Safety and Monitoring:  -- Involuntary admission to inpatient psychiatric unit for safety, stabilization and treatment  -- Daily contact with patient to assess and evaluate symptoms and progress in treatment  -- Patient's case to be discussed in multi-disciplinary team meeting  -- Observation Level : q15 minute checks  -- Vital signs:  q12 hours  -- Precautions: suicide, elopement, and assault  2. Psychiatric Diagnoses and Treatment:  -hold vraylar. OD was on 5-27 and half life of vraylar is 2-4 days.  -Start seroquel 50 mg qhs - for paranoia, mood stabilization. Can restart vraylar minimum of 8 days after overdose.  -continue lexapro 10 mg once daily -continue gabapentin 300 mg tid  -continue ativan 1 mg PRN  -resume propranolol  -continue vistaril 25 mg tid for increased anxiety  -continue trazodone 50 mg PRN for sleep  -hydroxyzine 25 mg PRN for anxiety   -- Metabolic profile and EKG monitoring obtained while on an atypical antipsychotic (BMI: Lipid Panel: HbgA1c: QTc:) 479  -- Encouraged patient to participate in unit milieu and in scheduled group therapies once group therapy resumes   3. Medical Issues Being Addressed:     4. Discharge Planning:   -- Social work and case management to assist with discharge planning and identification of hospital follow-up needs prior to discharge  -- Estimated LOS: 5-7 days  -- Discharge Concerns: Need to establish a safety plan; Medication compliance and effectiveness  -- Discharge Goals: Return home with outpatient referrals for mental health follow-up including medication management/psychotherapy     Meredith Pel, Medical Student 6/1/202310:11 AM   Total Time Spent in Direct Patient Care:  I personally spent 35 minutes on the unit in direct patient care. The direct patient care time included face-to-face time with the patient, reviewing the patient's chart, communicating with other professionals, and  coordinating care. Greater than 50% of this time was spent in counseling or coordinating care with the patient regarding goals of hospitalization, psycho-education, and discharge planning needs.  I personally was present and performed or re-performed the history, physical exam and medical decision-making activities of this service and have verified that the service and findings are accurately documented in the student's note, , as addended by me or notated below:  I directly edited the note, as above. Pt has significant paranoia and poor sleep and anxiety. Pt is suspicious and guarded in answering questions about paranoia stating, "they're trying to make me look crazy and im not going to tell you any more".  Restlessness possibly 2/2 akathesia after vraylar overdose.  Pt Agreeable to starting seroquel. States allergy in the computer is bc it makes her sleepy. We chose seroquel bc this medication has lower incidence of akathesia and we need to start medication for mood stabilization and significant paranoia. Goal to restart vraylar minimum of 8 days after overdose, as this medication was helpful for mood stabilization in the past for her.    Janine Limbo, MD Psychiatrist     I certify that inpatient services furnished can reasonably be expected to improve the patient's condition.

## 2021-07-07 ENCOUNTER — Telehealth (HOSPITAL_COMMUNITY): Payer: Self-pay | Admitting: Dermatology

## 2021-07-07 MED ORDER — BACITRACIN-NEOMYCIN-POLYMYXIN OINTMENT TUBE
TOPICAL_OINTMENT | Freq: Every day | CUTANEOUS | Status: AC
  Filled 2021-07-07: qty 14.17

## 2021-07-07 MED ORDER — BENZOCAINE 10 % MT GEL
Freq: Two times a day (BID) | OROMUCOSAL | Status: DC | PRN
  Filled 2021-07-07: qty 9

## 2021-07-07 NOTE — Group Note (Signed)
Recreation Therapy Group Note   Group Topic:Stress Management  Group Date: 07/07/2021 Start Time: 0935 End Time: 0955 Facilitators: Victorino Sparrow, [place] Location: [address] Dayroom   Goal Area(s) Addresses:  Patient will identify positive stress management techniques. Patient will identify benefits of using stress management post d/c.  Group Description:  Meditation.  LRT played a meditation for patients that focused on finding inner peace for different areas of their lives.  Patients were to listen and focus on the meditation to fully engage in the activity.  Patients were made aware of other places to find stress management techniques such as Youtube, Apps, etc.     Affect/Mood: N/A   Participation Level: Did not attend    Clinical Observations/Individualized Feedback:      Plan: Continue to engage patient in RT group sessions 2-3x/week.   Victorino Sparrow, LRT,CTRS 07/07/2021 10:58 AM

## 2021-07-07 NOTE — Progress Notes (Signed)
Pharmacy Note  [name]   [address] [place], [address], [place], [place] [address]  Hours:  Monday - Fri 8?AM-12?PM, 1-5?PM   ? Lunch 12?PM - 1?PM  Phone: [phone]  Pt currently receives meds via [employer], receives a one month supply at a time - since pt declined ACT team (per [place] pharmacist) they would need a new order for meds to be supplied weekly.   Currently pt was supplied meds to cover May 18 to June 14  Meds changes this admit  Gabapentin increased (using '100mg'$  capsules) Melatonin decreased (previously '5mg'$ ) Quetiapine new Hydroxyzine increased (previously on '10mg'$ ) Trazodone decreased (previously '100mg'$  scheduled)  Pharmacist would need to know when pt being discharged, so that they can expedite filling new or changed prescriptions. Can fill only a one week supply with MD order. Meds can be sent to patient via mail order at no charge. Ativan and other prns can be packaged in small supply to avoid overdose.   Prescriptions can be sent electronically, faxed or called in.   Minda Ditto PharmD 07/07/2021 10:28 AM

## 2021-07-07 NOTE — BH Assessment (Signed)
Care Management - Follow Up [facility] Discharges   Patient has been placed in an inpatient psychiatric hospital ([facility]) on 07-04-2021.

## 2021-07-07 NOTE — Group Note (Signed)
[place] LCSW Group Therapy Note   Group Date: 07/07/2021 Start Time: 1300 End Time: 1400  Type of Therapy and Topic:  Group Therapy:  Feelings around Relapse and Recovery  Participation Level:  Did Not Attend   Mood:  Description of Group:    Patients in this group will discuss emotions they experience before and after a relapse. They will process how experiencing these feelings, or avoidance of experiencing them, relates to having a relapse. Facilitator will guide patients to explore emotions they have related to recovery. Patients will be encouraged to process which emotions are more powerful. They will be guided to discuss the emotional reaction significant others in their lives may have to patients' relapse or recovery. Patients will be assisted in exploring ways to respond to the emotions of others without this contributing to a relapse.  Therapeutic Goals: Patient will identify two or more emotions that lead to relapse for them:  Patient will identify two emotions that result when they relapse:  Patient will identify two emotions related to recovery:  Patient will demonstrate ability to communicate their needs through discussion and/or role plays.   Summary of Patient Progress:  Patient did not attend group despite encouraged participation.    Therapeutic Modalities:   Cognitive Behavioral Therapy Solution-Focused Therapy Assertiveness Training Relapse Prevention Therapy   Durenda Hurt, [place]

## 2021-07-07 NOTE — Progress Notes (Signed)
[facility] MD Progress Note  07/07/2021 10:10 AM [name]  MRN:  [id number]  Subjective:  [name]  reported " I am feeling better, I am afraid to discharge with too much propranolol."   Evaluation: [name] was seen and evaluated by this practitioner and attending psychiatrist MD Dwyane Dee.  She presents with a bright and pleasant affect.  Denying suicidal or homicidal ideations.  Denies auditory or visual hallucinations.  Reports feeling better overall.  Reports she feels ready to discharge as she has some financial issues that she has to deal with.  States her rent is due and other bills need to be paid.  [name] states " I want to try that anymore it scared me when I overdosed because my heart heart rate was really low."  She reports she has been taking medications as indicated.  Denying any medication side effects.  Discussed safety planning with medications.    Pharmacy staff to follow-up with patient's current pharmacy [facility] for pill packing.  Patient was receptive to plan.  She reports she is connected with [facility] states her therapist Shauna Hugh has been helpful.  States she has plans to follow-up at discharge.  Patient anticipated discharge date 07/10/2021.  Patient was receptive to plan.  Principal Problem: Bipolar I disorder, current or most recent episode depressed, with psychotic features ([place]) Diagnosis: Principal Problem:   Bipolar I disorder, current or most recent episode depressed, with psychotic features ([place]) Active Problems:   Borderline personality disorder ([place])   Autism   GAD (generalized anxiety disorder)   Suicide attempt ([place])  Total Time spent with patient: 15 minutes  Past Psychiatric History:   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 fragile X syndrome    Chronic constipation    Depression    Drug-seeking behavior    Essential tremor    Headache     Overdose of acetaminophen 07/2017   and other meds   Personality disorder ([place])    Schizo-affective psychosis ([place])    Schizoaffective disorder, bipolar type ([place])    Seizures ([place])    Last seizure December 2017   Sleep apnea     Past Surgical History:  Procedure Laterality Date   MOUTH SURGERY  2009 or 2010   Family History:  Family History  Problem Relation Age of Onset   Mental illness Father    Asthma Father    PDD Brother    Seizures Brother    Family Psychiatric  History:   Social History:  Social History   Substance and Sexual Activity  Alcohol Use No   Alcohol/week: 1.0 standard drink   Types: 1 Standard drinks or equivalent per week   Comment: denies at this time     Social History   Substance and Sexual Activity  Drug Use No   Comment: History of cocaine use at age 26 for 4 months    Social History   Socioeconomic History   Marital status: Widowed    Spouse name: Not on file   Number of children: 0   Years of education: Not on file   Highest education level: Not on file  Occupational History   Occupation: disability  Tobacco Use   Smoking status: Former    Packs/day: 0.00    Types: Cigarettes   Smokeless tobacco: Never   Tobacco  comments:    Smoked for 2  years age [age]  Vaping Use   Vaping Use: Never used  Substance and Sexual Activity   Alcohol use: No    Alcohol/week: 1.0 standard drink    Types: 1 Standard drinks or equivalent per week    Comment: denies at this time   Drug use: No    Comment: History of cocaine use at age 1 for 4 months   Sexual activity: Not Currently    Birth control/protection: None  Other Topics Concern   Not on file  Social History Narrative   Marital status: Widowed      Children: daughter      Lives: with boyfriend, in two story home      Employment:  Disability      Tobacco: quit smoking; smoked for two years.      Alcohol ;none      Drugs: none   Has not traveled outside of the country.   Right  handed         Social Determinants of Health   Financial Resource Strain: Not on file  Food Insecurity: Not on file  Transportation Needs: Not on file  Physical Activity: Not on file  Stress: Not on file  Social Connections: Not on file   Additional Social History:                         Sleep: Good  Appetite:  Good  Current Medications: Current Facility-Administered Medications  Medication Dose Route Frequency Provider Last Rate Last Admin   acetaminophen (TYLENOL) tablet 650 mg  650 mg Oral Q6H PRN Starkes-Perry, Gayland Curry, FNP       albuterol (PROVENTIL) (2.5 MG/3ML) 0.083% nebulizer solution 2.5 mg  2.5 mg Inhalation Q6H PRN Starkes-Perry, Gayland Curry, FNP       alum & mag hydroxide-simeth (MAALOX/MYLANTA) 200-200-20 MG/5ML suspension 30 mL  30 mL Oral Q4H PRN Starkes-Perry, Gayland Curry, FNP       calcium carbonate (TUMS - dosed in mg elemental calcium) chewable tablet 200 mg of elemental calcium  1 tablet Oral Daily PRN Starkes-Perry, Gayland Curry, FNP       diclofenac Sodium (VOLTAREN) 1 % topical gel 2 g  2 g Topical QID PRN Suella Broad, FNP   2 g at 07/06/21 1223   escitalopram (LEXAPRO) tablet 10 mg  10 mg Oral Daily Suella Broad, FNP   10 mg at 07/07/21 0813   feeding supplement (ENSURE ENLIVE / ENSURE PLUS) liquid 237 mL  237 mL Oral BID BM Massengill, Nathan, MD   237 mL at 07/06/21 1407   fluticasone furoate-vilanterol (BREO ELLIPTA) 200-25 MCG/ACT 1 puff  1 puff Inhalation Daily Suella Broad, FNP   1 puff at 07/07/21 0813   gabapentin (NEURONTIN) capsule 300 mg  300 mg Oral TID Janine Limbo, MD   300 mg at 07/07/21 9323   hydrOXYzine (ATARAX) tablet 25 mg  25 mg Oral TID PRN Janine Limbo, MD   25 mg at 07/06/21 2111   ibuprofen (ADVIL) tablet 400 mg  400 mg Oral Q6H PRN Suella Broad, FNP   400 mg at 07/06/21 1222   LORazepam (ATIVAN) tablet 1 mg  1 mg Oral Q6H PRN Suella Broad, FNP   1 mg at 07/05/21 0827    risperiDONE (RISPERDAL M-TABS) disintegrating tablet 2 mg  2 mg Oral Q8H PRN Suella Broad, FNP  And   LORazepam (ATIVAN) tablet 1 mg  1 mg Oral PRN Starkes-Perry, Gayland Curry, FNP       And   ziprasidone (GEODON) injection 20 mg  20 mg Intramuscular PRN Starkes-Perry, Gayland Curry, FNP       magnesium hydroxide (MILK OF MAGNESIA) suspension 30 mL  30 mL Oral Daily PRN Starkes-Perry, Gayland Curry, FNP       melatonin tablet 3 mg  3 mg Oral QHS Suella Broad, FNP   3 mg at 07/06/21 2110   neomycin-bacitracin-polymyxin (NEOSPORIN) ointment   Topical Daily Rozetta Nunnery, NP   Given at 07/07/21 0813   pantoprazole (PROTONIX) EC tablet 40 mg  40 mg Oral Daily Suella Broad, FNP   40 mg at 07/07/21 0813   polyethylene glycol (MIRALAX / GLYCOLAX) packet 17 g  17 g Oral Daily PRN Suella Broad, FNP       propranolol (INDERAL) tablet 10 mg  10 mg Oral BID Massengill, Ovid Curd, MD   10 mg at 07/07/21 0932   QUEtiapine (SEROQUEL) tablet 50 mg  50 mg Oral QHS Massengill, Ovid Curd, MD   50 mg at 07/06/21 2111   traZODone (DESYREL) tablet 50 mg  50 mg Oral QHS PRN Massengill, Ovid Curd, MD        Lab Results: No results found for this or any previous visit (from the past 48 hour(s)).  Blood Alcohol level:  Lab Results  Component Value Date   ETH <10 07/01/2021   ETH <10 67/01/4579    Metabolic Disorder Labs: Lab Results  Component Value Date   HGBA1C 5.1 03/02/2021   MPG 99.67 03/02/2021   MPG 96.8 07/02/2019   Lab Results  Component Value Date   PROLACTIN 66.2 (H) 03/02/2021   PROLACTIN 66.1 (H) 02/04/2020   Lab Results  Component Value Date   CHOL 245 (H) 05/01/2021   TRIG 169 (H) 05/01/2021   HDL 46 05/01/2021   CHOLHDL 5.3 05/01/2021   VLDL 34 05/01/2021   LDLCALC 165 (H) 05/01/2021   LDLCALC 102 (H) 04/24/2021    Physical Findings: AIMS:  , ,  ,  ,    CIWA:    COWS:     Musculoskeletal: Strength & Muscle Tone: within normal limits Gait & Station:  normal Patient leans: N/A  Psychiatric Specialty Exam:  Presentation  General Appearance: Appropriate for Environment  Eye Contact:Good  Speech:Clear and Coherent  Speech Volume:Normal  Handedness:Right   Mood and Affect  Mood:Anxious; Depressed  Affect:Congruent   Thought Process  Thought Processes:Coherent  Descriptions of Associations:Intact  Orientation:Full (Time, Place and Person)  Thought Content:Logical  History of Schizophrenia/Schizoaffective disorder:No  Duration of Psychotic Symptoms:Greater than six months  Hallucinations:Hallucinations: None  Ideas of Reference:None  Suicidal Thoughts:Suicidal Thoughts: No  Homicidal Thoughts:Homicidal Thoughts: No   Sensorium  Memory:Recent Good; Immediate Good  Judgment:Poor  Insight:Good   Executive Functions  Concentration:Fair  Attention Span:Good  Recall:Good  Fund of Knowledge:Fair  Language:Fair   Psychomotor Activity  Psychomotor Activity:Psychomotor Activity: Normal  Assets  Assets:Desire for Improvement; Financial Resources/Insurance   Sleep  Sleep:Sleep: Fair   Physical Exam: Physical Exam Vitals and nursing note reviewed.  Neurological:     Mental Status: She is alert and oriented to person, place, and time.  Psychiatric:        Mood and Affect: Mood normal.        Behavior: Behavior normal.   Review of Systems  Eyes: Negative.   Cardiovascular: Negative.   Musculoskeletal:  Negative.   Neurological: Negative.   Psychiatric/Behavioral:  Positive for depression. Negative for hallucinations and suicidal ideas. The patient is nervous/anxious.   All other systems reviewed and are negative. Blood pressure 108/68, pulse 99, temperature (!) 97.5 F (36.4 C), temperature source Oral, resp. rate 16, height '5\' 6"'$  (1.676 m), weight 93.4 kg, SpO2 100 %. Body mass index is 33.25 kg/m.   Treatment Plan Summary: Daily contact with patient to assess and evaluate symptoms and  progress in treatment and Medication management  Continue with current treatment plan on 07/07/2021 as listed below except for noted  Bipolar 1 disorder Generalized anxiety disorder Borderline personality disorder  Continue Lexapro 10 mg p.o. daily Continue Seroquel 50 mg p.o. nightly Continue gabapentin 300 mg p.o. 3 times daily Continue Inderal 10 mg p.o. twice daily  Patient encouraged to participate within the therapeutic milieu CSW to continue working on discharge disposition     Derrill [facility], NP 07/07/2021, 10:10 AM

## 2021-07-07 NOTE — Progress Notes (Signed)
   07/07/21 0900  Psych Admission Type (Psych Patients Only)  Admission Status Involuntary  Psychosocial Assessment  Patient Complaints Anxiety;Depression  Eye Contact Fair  Facial Expression Sad  Affect Depressed  Speech Soft  Interaction Assertive  Motor Activity Slow  Appearance/Hygiene Unremarkable  Behavior Characteristics Cooperative  Mood Pleasant;Depressed  Aggressive Behavior  Effect No apparent injury  Thought Process  Coherency Circumstantial  Content Blaming others  Delusions WDL  Perception WDL  Hallucination None reported or observed  Judgment Poor  Confusion None  Danger to Self  Current suicidal ideation? Denies  Self-Injurious Behavior No self-injurious ideation or behavior indicators observed or expressed   Agreement Not to Harm Self Yes  Description of Agreement Verbal  Danger to Others  Danger to Others None reported or observed

## 2021-07-07 NOTE — Progress Notes (Signed)
   07/07/21 2000  Psych Admission Type (Psych Patients Only)  Admission Status Involuntary  Psychosocial Assessment  Patient Complaints Anxiety  Eye Contact Fair  Facial Expression Sad  Affect Depressed  Speech Soft  Interaction Assertive  Motor Activity Slow  Appearance/Hygiene Unremarkable  Behavior Characteristics Cooperative  Mood Pleasant  Aggressive Behavior  Effect No apparent injury  Thought Process  Coherency Circumstantial  Content Blaming others  Delusions WDL  Perception WDL  Hallucination None reported or observed  Judgment Poor  Confusion None  Danger to Self  Current suicidal ideation? Denies

## 2021-07-07 NOTE — Progress Notes (Signed)
Adult Psychoeducational Group Note  Date:  07/07/2021 Time:  10:07 PM  Group Topic/Focus:  Wrap-Up Group:   The focus of this group is to help patients review their daily goal of treatment and discuss progress on daily workbooks.  Participation Level:  Active  Participation Quality:  Appropriate, Attentive, and Sharing  Affect:  Anxious  Cognitive:  Alert and Appropriate  Insight: Good  Engagement in Group:  Engaged  Modes of Intervention:  Discussion and Support  Additional Comments:  Pt shared her day was "boring as fuck". Pt shared she is leaving Monday and she is excited about that. Pt shared she is ready to go home and chill.   Terrial Rhodes 07/07/2021, 10:07 PM

## 2021-07-08 NOTE — Group Note (Signed)
LCSW Group Therapy Note  07/08/2021      Topic:  Anger Healthy and Unhealthy Coping Skills  Participation Level:  Minimal  Description of Group:   In this group, patients identified their own common triggers and typical reactions then analyzed how these reactions are possibly beneficial and possibly unhelpful.  Focus was placed on examining whether typical coping skills are healthy or unhealthy.  Therapeutic Goals: Patients will share situations that commonly incite their anger and how they typically respond Patients will identify how their coping skills work for them and/or against them Patients will explore possible alternative coping skills Patients will learn that anger itself is normal and that healthier reactions can assist with resolving conflict rather than worsening situations  Summary of Patient Progress:  The patient was present only for a small portion of group, then left and did not return.  Therapeutic Modalities:   Cognitive Behavioral Therapy Processing  [name]

## 2021-07-08 NOTE — Progress Notes (Signed)
Pt came to nursing station c/o wanting to go home. "I want to go home to see my own personal therapist. Rene Paci been here for days and they're not helping me. I've been to groups but its not about what's bothering me. Writer inquired about what's "bothering" the patient. Patient states, "My anxiety, I told them I can't take more than '10mg'$  of vistaril and I can't take seroquel. They gave it to me anyway and now I sleep all day." Writer informed Patient she will not be discharged today. Writer informed Pt she will notify Pt mental health provider of her desire to go home. Writer did not offer anything for anxiety due to patient c/o "sleepiness." Pt verbalized disenchantment with vistaril and seroquel.

## 2021-07-08 NOTE — Progress Notes (Signed)
Pt returned to nursing station to state, she needs protein shakes because that's all she drinks. However, pt has been eating food today and previous days.

## 2021-07-08 NOTE — Progress Notes (Signed)
   07/08/21 2137  Psych Admission Type (Psych Patients Only)  Admission Status Involuntary  Psychosocial Assessment  Patient Complaints Other (Comment) (heartburn)  Eye Contact Avoids  Facial Expression Flat  Affect Appropriate to circumstance;Depressed  Speech Soft  Interaction Minimal  Motor Activity Other (Comment) (wnl)  Appearance/Hygiene Unremarkable  Behavior Characteristics Appropriate to situation  Mood Pleasant  Thought Process  Coherency Tangential  Content Preoccupation  Delusions None reported or observed  Perception WDL  Hallucination None reported or observed  Judgment Poor  Confusion None  Danger to Self  Current suicidal ideation? Denies

## 2021-07-08 NOTE — Progress Notes (Signed)
[facility] MD Progress Note  07/08/2021 1:44 PM [name]  MRN:  [id number]  Subjective:  "I want to be discharge today, there is nothing wrong with me. I intentionally overdosed on 2 weeks of medications, I know it was stupid. My BP and pulse went down real low. I'm fine now and want to go home to my second Ex-boyfriend."   Brief History: Patient is a 31 year old female with a reported past psychiatric history of "intellectual disability, autism, bipolar disorder, depression, anxiety, and borderline personality disorder" who was admitted to the psychiatric hospital from [facility] after suicide attempt via overdose on 2 weeks of: propranolol (280 mg per medical record), vryalar 3 mg once daily, gabapentin 200 mg tid, lexapro 10 mg once daily, and protonix. Patient was treated and medically cleared at [facility] prior to transfer and admission to the psychiatric unit.   Reported home psychiatric medications: Vraylar 3 mg once daily, Lexapro 10 mg once daily, Vistaril 10 mg as needed, gabapentin 200 mg 3 times daily, propranolol.  [name]' Recommendations:  -Hold vraylar. OD was on 5-27 and half life of vraylar is 2-4 days.  Increased anxiety and restlessness could be 2/2 akathesia after overdose  -Continue lexapro 10 mg once daily -Restart and increase gabapentin to 300 mg tid  -Continue ativan 1 mg PRN  -Hold propranolol -Increase vistaril to 25 mg tid for increased anxiety  -Restart propanolol 10 mg bid for tachycardia and HTN  Daily Notes Today: On examination today, patient sitting up in bed. Chart reviewed and findings shared with the tx team and discussed with Dr. Winfred Leeds. Patient is alert and oriented to person, time, place and situation. Able to maintain eye contact during the assessment. Speech fluent with normal pattern and volume. Memory, judgement and insight fair. Thought process appears coherent but scattered. Continues on Agitation Protocol and psychiatric  medications without voiced adverse effects. No recent lab results. UDS on 07/01/21 negative for drugs, however, UDS on 06/24/21 was positive for Oxazepam.  Patient endorsed above information in the history of present illness. Reported that she wants to be discharge to home since there is nothing wrong with her. Appears not to fully understand her psychiatric state. Reports less depressed mood and anxiety which she rates as 3/10 on the scale of 0 to 10. 10 being the highest. Reported great appetite and sleeping up to 10 hours last night.  Denies SI, HI, AVH. Denies access to fire arms at her house. Reported she has a 66 year old daughter who is currently living with her paternal grand mother. Reported she needs to return to her second Ex-boyfriend whom she plans to "kick -out" soon. Reiterated on stories from deceased husband family / face book that blamed her as the cause of him committing suicide.  Reported taking her prescribed medications without any adverse reaction. Discussed and encouraged patient to participate in therapeutic milieu and group activities.   Principal Problem: Bipolar I disorder, current or most recent episode depressed, with psychotic features ([place])  Diagnosis: Principal Problem:   Bipolar I disorder, current or most recent episode depressed, with psychotic features ([place]) Active Problems:   Borderline personality disorder ([place])   Autism   GAD (generalized anxiety disorder)   Suicide attempt ([place])  Total Time spent with patient: 30 minutes  Past Psychiatric History: Diagnosis: Reported "intellectual disability, autism, bipolar disorder, depression, anxiety, borderline personality disorder" Patient reports history of multiple psychiatric hospitalizations Patient reports history of multiple suicide attempts Current medications: See above Past  psychiatric medication history: " I have taken so many I cannot recall.  But I do recall that Tegretol made blood come out of my  mouth".   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 fragile X syndrome    Chronic constipation    Depression    Drug-seeking behavior    Essential tremor    Headache    Overdose of acetaminophen 07/2017   and other meds   Personality disorder ([place])    Schizo-affective psychosis ([place])    Schizoaffective disorder, bipolar type ([place])    Seizures ([place])    Last seizure December 2017   Sleep apnea     Past Surgical History:  Procedure Laterality Date   MOUTH SURGERY  2009 or 2010   Family History:  Family History  Problem Relation Age of Onset   Mental illness Father    Asthma Father    PDD Brother    Seizures Brother    Family Psychiatric  History: Mental Illness father  Social History:  Social History   Substance and Sexual Activity  Alcohol Use No   Alcohol/week: 1.0 standard drink   Types: 1 Standard drinks or equivalent per week   Comment: denies at this time     Social History   Substance and Sexual Activity  Drug Use No   Comment: History of cocaine use at age 29 for 4 months    Social History   Socioeconomic History   Marital status: Widowed    Spouse name: Not on file   Number of children: 0   Years of education: Not on file   Highest education level: Not on file  Occupational History   Occupation: disability  Tobacco Use   Smoking status: Former    Packs/day: 0.00    Types: Cigarettes   Smokeless tobacco: Never   Tobacco comments:    Smoked for 2  years age [age]  Vaping Use   Vaping Use: Never used  Substance and Sexual Activity   Alcohol use: No    Alcohol/week: 1.0 standard drink    Types: 1 Standard drinks or equivalent per week    Comment: denies at this time   Drug use: No    Comment: History of cocaine use at age 73 for 4 months   Sexual activity: Not Currently    Birth control/protection: None  Other Topics Concern    Not on file  Social History Narrative   Marital status: Widowed      Children: daughter      Lives: with boyfriend, in two story home      Employment:  Disability      Tobacco: quit smoking; smoked for two years.      Alcohol ;none      Drugs: none   Has not traveled outside of the country.   Right handed         Social Determinants of Health   Financial Resource Strain: Not on file  Food Insecurity: Not on file  Transportation Needs: Not on file  Physical Activity: Not on file  Stress: Not on file  Social Connections: Not on file   Additional Social History:    Sleep: Good  Appetite:  Good  Current Medications: Current Facility-Administered Medications  Medication Dose Route Frequency Provider Last Rate Last Admin  acetaminophen (TYLENOL) tablet 650 mg  650 mg Oral Q6H PRN Starkes-Perry, Gayland Curry, FNP       albuterol (PROVENTIL) (2.5 MG/3ML) 0.083% nebulizer solution 2.5 mg  2.5 mg Inhalation Q6H PRN Starkes-Perry, Gayland Curry, FNP       alum & mag hydroxide-simeth (MAALOX/MYLANTA) 200-200-20 MG/5ML suspension 30 mL  30 mL Oral Q4H PRN Starkes-Perry, Gayland Curry, FNP       benzocaine (ORAJEL) 10 % mucosal gel   Mouth/Throat BID PRN Bobbitt, Shalon E, NP   Given at 07/07/21 2107   calcium carbonate (TUMS - dosed in mg elemental calcium) chewable tablet 200 mg of elemental calcium  1 tablet Oral Daily PRN Starkes-Perry, Gayland Curry, FNP       diclofenac Sodium (VOLTAREN) 1 % topical gel 2 g  2 g Topical QID PRN Suella Broad, FNP   2 g at 07/07/21 1414   escitalopram (LEXAPRO) tablet 10 mg  10 mg Oral Daily Suella Broad, FNP   10 mg at 07/08/21 0933   feeding supplement (ENSURE ENLIVE / ENSURE PLUS) liquid 237 mL  237 mL Oral BID BM Massengill, Nathan, MD   237 mL at 07/07/21 1414   fluticasone furoate-vilanterol (BREO ELLIPTA) 200-25 MCG/ACT 1 puff  1 puff Inhalation Daily Suella Broad, FNP   1 puff at 07/08/21 0934   gabapentin (NEURONTIN) capsule 300 mg   300 mg Oral TID Janine Limbo, MD   300 mg at 07/08/21 1300   hydrOXYzine (ATARAX) tablet 25 mg  25 mg Oral TID PRN Janine Limbo, MD   25 mg at 07/07/21 2106   ibuprofen (ADVIL) tablet 400 mg  400 mg Oral Q6H PRN Suella Broad, FNP   400 mg at 07/07/21 1414   LORazepam (ATIVAN) tablet 1 mg  1 mg Oral Q6H PRN Suella Broad, FNP   1 mg at 07/05/21 0827   risperiDONE (RISPERDAL M-TABS) disintegrating tablet 2 mg  2 mg Oral Q8H PRN Suella Broad, FNP       And   LORazepam (ATIVAN) tablet 1 mg  1 mg Oral PRN Starkes-Perry, Gayland Curry, FNP       And   ziprasidone (GEODON) injection 20 mg  20 mg Intramuscular PRN Starkes-Perry, Gayland Curry, FNP       magnesium hydroxide (MILK OF MAGNESIA) suspension 30 mL  30 mL Oral Daily PRN Starkes-Perry, Gayland Curry, FNP       melatonin tablet 3 mg  3 mg Oral QHS Suella Broad, FNP   3 mg at 07/07/21 2106   neomycin-bacitracin-polymyxin (NEOSPORIN) ointment   Topical Daily Rozetta Nunnery, NP   Given at 07/08/21 0934   pantoprazole (PROTONIX) EC tablet 40 mg  40 mg Oral Daily Suella Broad, FNP   40 mg at 07/08/21 0935   polyethylene glycol (MIRALAX / GLYCOLAX) packet 17 g  17 g Oral Daily PRN Starkes-Perry, Gayland Curry, FNP       propranolol (INDERAL) tablet 10 mg  10 mg Oral BID Massengill, Ovid Curd, MD   10 mg at 07/08/21 0354   QUEtiapine (SEROQUEL) tablet 50 mg  50 mg Oral QHS Massengill, Ovid Curd, MD   50 mg at 07/07/21 2106   traZODone (DESYREL) tablet 50 mg  50 mg Oral QHS PRN Massengill, Ovid Curd, MD        Lab Results: No results found for this or any previous visit (from the past 48 hour(s)).  Blood Alcohol level:  Lab Results  Component Value Date  ETH <10 07/01/2021   ETH <10 43/15/4008    Metabolic Disorder Labs: Lab Results  Component Value Date   HGBA1C 5.1 03/02/2021   MPG 99.67 03/02/2021   MPG 96.8 07/02/2019   Lab Results  Component Value Date   PROLACTIN 66.2 (H) 03/02/2021   PROLACTIN 66.1  (H) 02/04/2020   Lab Results  Component Value Date   CHOL 245 (H) 05/01/2021   TRIG 169 (H) 05/01/2021   HDL 46 05/01/2021   CHOLHDL 5.3 05/01/2021   VLDL 34 05/01/2021   LDLCALC 165 (H) 05/01/2021   LDLCALC 102 (H) 04/24/2021    Physical Findings: AIMS:  , ,  ,  ,    CIWA:    COWS:     Musculoskeletal: Strength & Muscle Tone: within normal limits Gait & Station: normal Patient leans: N/A  Psychiatric Specialty Exam:  Presentation  General Appearance: Appropriate for Environment; Fairly Groomed; Casual  Eye Contact:Good  Speech:Clear and Coherent; Normal Rate  Speech Volume:Normal  Handedness:Right  Mood and Affect  Mood:Anxious; Depressed  Affect:Congruent; Depressed  Thought Process  Thought Processes:Coherent; Linear  Descriptions of Associations:Intact  Orientation:Full (Time, Place and Person)  Thought Content:Logical; WDL  History of Schizophrenia/Schizoaffective disorder:Yes  Duration of Psychotic Symptoms:Greater than six months  Hallucinations:Hallucinations: None Description of Auditory Hallucinations: Denied  Ideas of Reference:None  Suicidal Thoughts:Suicidal Thoughts: No SI Active Intent and/or Plan: -- (Denies)  Homicidal Thoughts:Homicidal Thoughts: No   Sensorium  Memory:Immediate Fair; Recent Fair; Remote Fair  Judgment:Fair  Insight:Fair  Executive Functions  Concentration:Fair  Attention Span:Good  [place]  Language:Good  Psychomotor Activity  Psychomotor Activity:Psychomotor Activity: Normal  Assets  Assets:Communication Skills; Physical Health; Financial Resources/Insurance  Sleep  Sleep:Sleep: Good Number of Hours of Sleep: 10  Physical Exam: Physical Exam Vitals and nursing note reviewed.  Constitutional:      Appearance: Normal appearance.  HENT:     Head: Normocephalic and atraumatic.     Right Ear: External ear normal.     Left Ear: External ear normal.     Nose:  Nose normal.     Mouth/Throat:     Mouth: Mucous membranes are moist.     Pharynx: Oropharynx is clear.  Eyes:     Extraocular Movements: Extraocular movements intact.     Conjunctiva/sclera: Conjunctivae normal.     Pupils: Pupils are equal, round, and reactive to light.  Cardiovascular:     Rate and Rhythm: Tachycardia present.     Comments: BP 114/98, P 102. Nursing staff to recheck. Pulmonary:     Effort: Pulmonary effort is normal.  Abdominal:     Palpations: Abdomen is soft.  Genitourinary:    Comments: Deferred Musculoskeletal:        General: Normal range of motion.     Cervical back: Normal range of motion and neck supple.  Skin:    General: Skin is warm.  Neurological:     General: No focal deficit present.     Mental Status: She is alert and oriented to person, place, and time.  Psychiatric:        Behavior: Behavior normal.   Review of Systems  Constitutional: Negative.  Negative for chills and fever.  HENT: Negative.  Negative for hearing loss and tinnitus.   Eyes: Negative.  Negative for blurred vision and double vision.  Respiratory: Negative.  Negative for cough, sputum production, shortness of breath and wheezing.   Cardiovascular: Negative.  Negative for chest pain.  BP 114/98, P 102. Nursing staff to recheck.  Gastrointestinal: Negative.  Negative for abdominal pain, constipation, diarrhea, heartburn, nausea and vomiting.  Genitourinary: Negative.  Negative for dysuria, frequency and urgency.  Musculoskeletal: Negative.  Negative for back pain, falls, joint pain, myalgias and neck pain.  Skin: Negative.  Negative for itching and rash.       Deep scratched area to left anterior forearm intact. Serosanguineous drainage note to bed sheet in small amount.   Neurological:  Positive for seizures (Hx of seizures). Negative for dizziness, tingling, tremors, sensory change, speech change, focal weakness, loss of consciousness, weakness and headaches.   Endo/Heme/Allergies: Negative.  Negative for environmental allergies and polydipsia. Does not bruise/bleed easily.        Bee Venom      Anaphylaxis   High   Allergy   05/06/2015               Coconut Flavor      Anaphylaxis, Rash   High   Allergy   10/24/2013               Geodon [Ziprasidone Hcl]      Other (See Comments)   High   Contraindication   08/02/2013         Pt states that this medication causes paralysis of the mouth.        Haloperidol And Related      Other (See Comments)   High   Contraindication   08/02/2013         Pt states that this medication causes paralysis of the mouth, jaw locks up      Lithium      Other (See Comments)   High   Contraindication   08/02/2013         Seizure-like activity       Quetiapine      Other (See Comments)   High      03/10/2014         Pt states that this medication is too strong.       Shellfish Allergy      Anaphylaxis   High   Allergy   05/06/2015               Sulfa Antibiotics      Other (See Comments)   Medium      08/03/2015         Chest pain       Prozac [Fluoxetine]      Other (See Comments)   Not Specified      01/30/2020         Increased Depression , Suicidal thoughts      Tape      Other (See Comments)   Not Specified      07/31/2019         Skin tears, can only tolerate paper tape.      Tylenol [Acetaminophen]         Not Specified      06/03/2021         Unspecified reaction  Adverse Reactions/Drug Intolerances         Oxycodone      Other (See Comments)   High   Intolerance    08/02/2013         Hallucinations       Phenergan [Promethazine Hcl]      Other (See Comments)   Medium   Intolerance   08/03/2015         Chest pain        Prilosec [Omeprazole]      Nausea And Vomiting   Medium   Intolerance   08/02/2013               Tegretol [Carbamazepine]      Nausea And Vomiting   Medium   Intolerance   05/06/2015    Psychiatric/Behavioral:  Positive for depression, substance abuse and suicidal ideas. The patient is nervous/anxious.   Blood pressure (!) 114/98, pulse (!) 102, temperature 98.5 F (36.9 C), temperature source Oral, resp. rate 20, height '5\' 6"'$  (1.676 m), weight 93.4 kg, SpO2 98 %. Body mass index is 33.25 kg/m.  Treatment Plan Summary: Daily contact with patient to assess and evaluate symptoms and progress in treatment and Medication management.   Treatment Plan Summary: Daily contact with patient to assess and evaluate symptoms and progress in treatment   ASSESSMENT:   Diagnoses / Active Problems: -Bipolar disorder with psychotic features, current episode is depressed -GAD -History of autism -Reported borderline personality disorder -History of multiple suicide attempts   PLAN: Safety and Monitoring:             -- Involuntary admission to inpatient psychiatric unit for safety, stabilization and treatment             -- Daily contact with patient to assess and evaluate symptoms and progress in treatment             -- Patient's case to be discussed in multi-disciplinary team meeting             -- Observation Level : q15 minute checks             -- Vital signs:  q12 hours             -- Precautions: suicide, elopement, and assault   2. Psychiatric Diagnoses and Treatment:               -hold vraylar. OD was on 5-27 and half life of vraylar is 2-4 days.  Increased anxiety and restlessness could be 2/2 akathesia after overdose  -continue lexapro 10 mg once daily -restart and  increase gabapentin to 300 mg tid  -continue ativan 1 mg PRN  -hold propranolol -increase vistaril to 25 mg tid for increased anxiety  -restart propanolol 10 mg bid for tachycardia and HTN   --  The risks/benefits/side-effects/alternatives to this medication were discussed in detail with the patient and time was given for questions. The patient consents to medication trial.                -- Metabolic profile and EKG monitoring obtained while on an atypical antipsychotic (BMI: Lipid Panel: HbgA1c: QTc:) 479             -- Encouraged patient to participate in unit milieu and in scheduled group therapies              -- Short Term Goals: Ability to identify changes in lifestyle to reduce recurrence of condition will improve, Ability to verbalize feelings  will improve, Ability to disclose and discuss suicidal ideas, Ability to demonstrate self-control will improve, Ability to identify and develop effective coping behaviors will improve, Ability to maintain clinical measurements within normal limits will improve, Compliance with prescribed medications will improve, and Ability to identify triggers associated with substance abuse/mental health issues will improve             -- Long Term Goals: Improvement in symptoms so as ready for discharge           3. Medical Issues Being Addressed:              Tobacco Use Disorder             -- Nicotine patch '21mg'$ /24 hours ordered             -- Smoking cessation encouraged   4. Discharge Planning:              -- Social work and case management to assist with discharge planning and identification of hospital follow-up needs prior to discharge             -- Estimated LOS: 5-7 days             -- Discharge Concerns: Need to establish a safety plan; Medication compliance and effectiveness             -- Discharge Goals: Return home with outpatient referrals for mental health follow-up including medication management/psychotherapy   Observation  Level/Precautions:  15 minute checks  Laboratory:  see above   Psychotherapy:    Medications:  See [facility]  Consultations:  Pending  Discharge Concerns:  Safety  Estimated LOS: 5-7 days   Other:      Physician Treatment Plan for Primary Diagnosis: Bipolar I disorder, current or most recent episode depressed, with psychotic features [facility])   Physician Treatment Plan for Secondary Diagnosis: Principal Problem:   Bipolar I disorder, current or most recent episode depressed, with psychotic features ([place]) Active Problems:   Borderline personality disorder ([place])   Autism   GAD (generalized anxiety disorder)   Suicide attempt ([place])     I certify that inpatient services furnished can reasonably be expected to improve the patient's condition.    Laretta Bolster, FNP 07/08/2021, 1:44 PM

## 2021-07-08 NOTE — Progress Notes (Signed)
CC: Pt reports she feels blamed for her husband's death and is experiencing financial stressors. Pt overdosed on numerous medications (Gabapentin, Protonix, Propanolol, Lexapro and Vraylar.)  Pt is A&OX4, calm, denies suicidal ideations, denies homicidal ideations, denies auditory hallucinations and denies visual hallucinations. Pt verbally agrees to approach staff if these become apparent and before harming self or others. Pt denies experiencing nightmares. Mood and affect are congruent. Pt appetite is ok. No complaints of anxiety, distress, pain and/or discomfort at this time. Pt's memory appears to be grossly intact, and Pt hasn't displayed any injurious behaviors. Pt is medication compliant. There's no evidence of suicidal intent. Psychomotor activity was WNL. No s/s of Parkinson, Dystonia, Akathisia and/or Tardive Dyskinesia noted.

## 2021-07-08 NOTE — Progress Notes (Signed)
   07/08/21 0530  Sleep  Number of Hours 5.5

## 2021-07-08 NOTE — Progress Notes (Signed)
Pt did not achieve wrap up group.

## 2021-07-09 DIAGNOSIS — F315 Bipolar disorder, current episode depressed, severe, with psychotic features: Principal | ICD-10-CM

## 2021-07-09 NOTE — Progress Notes (Signed)
   07/09/21 2223  Psych Admission Type (Psych Patients Only)  Admission Status Involuntary  Psychosocial Assessment  Patient Complaints Other (Comment) (concerned about when she will discharged)  Eye Contact Avoids  Facial Expression Flat  Affect Appropriate to circumstance;Depressed  Speech Soft  Interaction Minimal  Motor Activity Other (Comment) (wnl)  Appearance/Hygiene Unremarkable  Behavior Characteristics Cooperative;Appropriate to situation  Mood Preoccupied  Thought Process  Coherency Tangential  Content Preoccupation  Delusions None reported or observed  Perception WDL  Hallucination None reported or observed  Judgment Poor  Confusion None  Danger to Self  Current suicidal ideation? Denies

## 2021-07-09 NOTE — Plan of Care (Signed)
Patient difficult to get out of bed this morning but eventually came up and took her medications. Denies current SI/HI, denies any A/V hallucinations. She told Probation officer she plans to move to a different place in [place] when she gets out of here. [place] and there is too much pain here to stay here. Patient continues to have some anxiety and depression, she does not verbalize a rating of the anxiety and depression. Verbal encouragement provided, will continue q44mn checks.     Problem: Education: Goal: Knowledge of [place] [facility] Education information/materials will improve Outcome: Progressing Goal: Emotional status will improve Outcome: Progressing Goal: Mental status will improve Outcome: Progressing Goal: Verbalization of understanding the information provided will improve Outcome: Progressing   Problem: Activity: Goal: Interest or engagement in activities will improve Outcome: Progressing Goal: Sleeping patterns will improve Outcome: Progressing   Problem: Coping: Goal: Ability to verbalize frustrations and anger appropriately will improve Outcome: Progressing Goal: Ability to demonstrate self-control will improve Outcome: Progressing   Problem: Health Behavior/Discharge Planning: Goal: Identification of resources available to assist in meeting health care needs will improve Outcome: Progressing Goal: Compliance with treatment plan for underlying cause of condition will improve Outcome: Progressing   Problem: Physical Regulation: Goal: Ability to maintain clinical measurements within normal limits will improve Outcome: Progressing   Problem: Safety: Goal: Periods of time without injury will increase Outcome: Progressing

## 2021-07-09 NOTE — Group Note (Signed)
[facility] LCSW Group Therapy Note  Date/Time:  07/09/2021    Type of Therapy and Topic:  Group Therapy:  Using Music to Encourage Yourself  Participation Level:  Active   Description of Group: In this process group, members listened to a variety of music through choosing from CSW's list #1 through #25.  Patients identified the messages received from those songs and how the music affected their emotions.  Patients were encouraged to use music as a coping skill at home, but to be mindful of the choices made.  Patients discussed how this knowledge can help with wellness and recovery in various ways including managing depression and anxiety as well as encouraging healthy sleep habits.    Therapeutic Goals: Patients will explore the impact of different songs on mood Patients will verbalize the thoughts they have when listening to different types of music Patients will identify music that is soothing to them as well as music that is energizing to them Patients will discuss how to use this knowledge to assist in maintaining wellness and recovery Patients will explore the use of music as a coping skill Patients will encourage one another  Summary of Patient Progress:  At the beginning of group the patient was fairly agitated.  She chose the first song as described above, but when that song first started she pressed her fingers into her ears very hard to drown It out.  The name of the song was "Happy" and CSW stopped the music, asked her what was going on; she said she does not want to be happy.  She said "I'm good like I am."  CSW gently reminded her that she is, after all, in a mental health hospital.  She loudly responded, "Because of a stupid action I took."  She went on to talk about her desire to move out [place] to [place] since she has to stay in state.  Therapeutic Modalities: Solution Focused Brief Therapy Activity   Selmer Dominion, LCSW

## 2021-07-09 NOTE — Progress Notes (Signed)
Patient given a salad for dinner as an alternative choice since she doesn't like the regular food here.

## 2021-07-09 NOTE — Progress Notes (Signed)
Patient is refusing trays, stating at home she only consumes protein shakes and she is not going to eat food here, she will wait until she discharges. Ensure given along with water, offered protein bars, etc. Pt declined. Will continue to encourage her to eat. Garrison Columbus FNP made aware.

## 2021-07-09 NOTE — Progress Notes (Addendum)
[facility] MD Progress Note  07/09/2021 11:52 AM [name]  MRN:  [id number]  Subjective:  "I want to be discharge today!!!!,  there is nothing wrong with me. I intentionally overdosed on 2 weeks of medications, I know it was stupid."   Brief History: Patient is a 31 year old female with a reported past psychiatric history of "intellectual disability, autism, bipolar disorder, depression, anxiety, and borderline personality disorder" who was admitted to the psychiatric hospital from [facility] after suicide attempt via overdose on 2 weeks of: propranolol (280 mg per medical record), vryalar 3 mg once daily, gabapentin 200 mg tid, lexapro 10 mg once daily, and protonix. Patient was treated and medically cleared at [facility] prior to transfer and admission to the psychiatric unit.   Reported home psychiatric medications: Vraylar 3 mg once daily, Lexapro 10 mg once daily, Vistaril 10 mg as needed, gabapentin 200 mg 3 times daily, propranolol.  Karene Fry' Recommendations:  -Hold vraylar. OD was on 5-27 and half life of vraylar is 2-4 days.  Increased anxiety and restlessness could be 2/2 akathesia after overdose  -Continue lexapro 10 mg once daily -Restart and increase gabapentin to 300 mg tid  -Continue ativan 1 mg PRN  -Hold propranolol -Increase vistaril to 25 mg tid for increased anxiety  -Restart propanolol 10 mg bid for tachycardia and HTN  Daily Notes Today: On examination today, patient sitting up in bed. Chart reviewed and findings shared with the tx team and discussed with Dr. Winfred Leeds. Patient is alert and oriented to person, time, place and situation. Able to maintain eye contact during the assessment. Speech fluent with normal pattern and volume. Memory, judgement and insight fair. Thought process appears coherent but scattered. Continues on Agitation Protocol and psychiatric medications without voiced adverse effects. Agitation protocal activated today due to increasing  agitation about wanting to be discharged and refusing to eat her meals. No recent routine lab results. UDS on 07/01/21 negative for drugs, however, UDS on 06/24/21 was positive for Oxazepam. No medication changes today. Left forearm scratched area intact without any drainage.  Reported that she wants to be discharge to home since there is nothing wrong with her. Appears not to fully understand her psychiatric state. Reports less depressed mood and anxiety which she rates as 3/10 on the scale of 0 to 10. 10 being the highest. Reported decent appetite and sleeping up to 8 hours last night.  Denies SI, HI, AVH. Denies access to fire arms at her house. Reported she has a 38 year old daughter who is currently living with her paternal grand mother. Reported she needs to return to her second Ex-boyfriend whom she plans to "kick -out" soon. Reiterated on stories from deceased husband family / face book that blamed her as the cause of him committing suicide.  Reported taking her prescribed medications without any adverse reaction. Discussed and encouraged patient to participate in therapeutic milieu and group activities.   Principal Problem: Bipolar I disorder, current or most recent episode depressed, with psychotic features ([place])  Diagnosis: Principal Problem:   Bipolar I disorder, current or most recent episode depressed, with psychotic features ([place]) Active Problems:   Borderline personality disorder ([place])   Autism   GAD (generalized anxiety disorder)   Suicide attempt ([place])  Total Time spent with patient: 30 minutes  Past Psychiatric History: Diagnosis: Reported "intellectual disability, autism, bipolar disorder, depression, anxiety, borderline personality disorder" Patient reports history of multiple psychiatric hospitalizations Patient reports history of multiple suicide attempts Current medications:  See above Past psychiatric medication history: " I have taken so many I cannot recall.  But I  do recall that Tegretol made blood come out of my mouth".   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 fragile X syndrome    Chronic constipation    Depression    Drug-seeking behavior    Essential tremor    Headache    Overdose of acetaminophen 07/2017   and other meds   Personality disorder ([place])    Schizo-affective psychosis ([place])    Schizoaffective disorder, bipolar type ([place])    Seizures ([place])    Last seizure December 2017   Sleep apnea     Past Surgical History:  Procedure Laterality Date   MOUTH SURGERY  2009 or 2010   Family History:  Family History  Problem Relation Age of Onset   Mental illness Father    Asthma Father    PDD Brother    Seizures Brother    Family Psychiatric  History: Mental Illness father  Social History:  Social History   Substance and Sexual Activity  Alcohol Use No   Alcohol/week: 1.0 standard drink   Types: 1 Standard drinks or equivalent per week   Comment: denies at this time     Social History   Substance and Sexual Activity  Drug Use No   Comment: History of cocaine use at age 78 for 4 months    Social History   Socioeconomic History   Marital status: Widowed    Spouse name: Not on file   Number of children: 0   Years of education: Not on file   Highest education level: Not on file  Occupational History   Occupation: disability  Tobacco Use   Smoking status: Former    Packs/day: 0.00    Types: Cigarettes   Smokeless tobacco: Never   Tobacco comments:    Smoked for 2  years age [age]  Vaping Use   Vaping Use: Never used  Substance and Sexual Activity   Alcohol use: No    Alcohol/week: 1.0 standard drink    Types: 1 Standard drinks or equivalent per week    Comment: denies at this time   Drug use: No    Comment: History of cocaine use at age 36 for 4 months   Sexual activity: Not Currently    Birth  control/protection: None  Other Topics Concern   Not on file  Social History Narrative   Marital status: Widowed      Children: daughter      Lives: with boyfriend, in two story home      Employment:  Disability      Tobacco: quit smoking; smoked for two years.      Alcohol ;none      Drugs: none   Has not traveled outside of the country.   Right handed         Social Determinants of Health   Financial Resource Strain: Not on file  Food Insecurity: Not on file  Transportation Needs: Not on file  Physical Activity: Not on file  Stress: Not on file  Social Connections: Not on file   Additional Social History:    Sleep: Good  Appetite:  Good  Current Medications: Current Facility-Administered Medications  Medication Dose Route Frequency Provider Last Rate Last  Admin   acetaminophen (TYLENOL) tablet 650 mg  650 mg Oral Q6H PRN Starkes-Perry, Gayland Curry, FNP       albuterol (PROVENTIL) (2.5 MG/3ML) 0.083% nebulizer solution 2.5 mg  2.5 mg Inhalation Q6H PRN Starkes-Perry, Gayland Curry, FNP       alum & mag hydroxide-simeth (MAALOX/MYLANTA) 200-200-20 MG/5ML suspension 30 mL  30 mL Oral Q4H PRN Starkes-Perry, Gayland Curry, FNP       benzocaine (ORAJEL) 10 % mucosal gel   Mouth/Throat BID PRN Bobbitt, Shalon E, NP   Given at 07/07/21 2107   calcium carbonate (TUMS - dosed in mg elemental calcium) chewable tablet 200 mg of elemental calcium  1 tablet Oral Daily PRN Suella Broad, FNP   200 mg of elemental calcium at 07/08/21 2136   diclofenac Sodium (VOLTAREN) 1 % topical gel 2 g  2 g Topical QID PRN Suella Broad, FNP   2 g at 07/07/21 1414   escitalopram (LEXAPRO) tablet 10 mg  10 mg Oral Daily Suella Broad, FNP   10 mg at 07/09/21 0855   feeding supplement (ENSURE ENLIVE / ENSURE PLUS) liquid 237 mL  237 mL Oral BID BM Massengill, Ovid Curd, MD   237 mL at 07/09/21 0936   fluticasone furoate-vilanterol (BREO ELLIPTA) 200-25 MCG/ACT 1 puff  1 puff Inhalation Daily  Suella Broad, FNP   1 puff at 07/09/21 0855   gabapentin (NEURONTIN) capsule 300 mg  300 mg Oral TID Janine Limbo, MD   300 mg at 07/09/21 0855   hydrOXYzine (ATARAX) tablet 25 mg  25 mg Oral TID PRN Janine Limbo, MD   25 mg at 07/07/21 2106   ibuprofen (ADVIL) tablet 400 mg  400 mg Oral Q6H PRN Suella Broad, FNP   400 mg at 07/07/21 1414   LORazepam (ATIVAN) tablet 1 mg  1 mg Oral Q6H PRN Suella Broad, FNP   1 mg at 07/05/21 0827   risperiDONE (RISPERDAL M-TABS) disintegrating tablet 2 mg  2 mg Oral Q8H PRN Suella Broad, FNP       And   LORazepam (ATIVAN) tablet 1 mg  1 mg Oral PRN Starkes-Perry, Gayland Curry, FNP       And   ziprasidone (GEODON) injection 20 mg  20 mg Intramuscular PRN Starkes-Perry, Gayland Curry, FNP       magnesium hydroxide (MILK OF MAGNESIA) suspension 30 mL  30 mL Oral Daily PRN Starkes-Perry, Gayland Curry, FNP       melatonin tablet 3 mg  3 mg Oral QHS Suella Broad, FNP   3 mg at 07/08/21 2124   pantoprazole (PROTONIX) EC tablet 40 mg  40 mg Oral Daily Suella Broad, FNP   40 mg at 07/09/21 0855   polyethylene glycol (MIRALAX / GLYCOLAX) packet 17 g  17 g Oral Daily PRN Starkes-Perry, Gayland Curry, FNP       propranolol (INDERAL) tablet 10 mg  10 mg Oral BID Massengill, Ovid Curd, MD   10 mg at 07/09/21 0855   QUEtiapine (SEROQUEL) tablet 50 mg  50 mg Oral QHS Massengill, Ovid Curd, MD   50 mg at 07/08/21 2124   traZODone (DESYREL) tablet 50 mg  50 mg Oral QHS PRN Massengill, Ovid Curd, MD        Lab Results: No results found for this or any previous visit (from the past 48 hour(s)).  Blood Alcohol level:  Lab Results  Component Value Date   ETH <10 07/01/2021   ETH <10 06/26/2021  Metabolic Disorder Labs: Lab Results  Component Value Date   HGBA1C 5.1 03/02/2021   MPG 99.67 03/02/2021   MPG 96.8 07/02/2019   Lab Results  Component Value Date   PROLACTIN 66.2 (H) 03/02/2021   PROLACTIN 66.1 (H) 02/04/2020    Lab Results  Component Value Date   CHOL 245 (H) 05/01/2021   TRIG 169 (H) 05/01/2021   HDL 46 05/01/2021   CHOLHDL 5.3 05/01/2021   VLDL 34 05/01/2021   LDLCALC 165 (H) 05/01/2021   LDLCALC 102 (H) 04/24/2021    Physical Findings: AIMS:  , ,  ,  ,    CIWA:    COWS:     Musculoskeletal: Strength & Muscle Tone: within normal limits Gait & Station: normal Patient leans: N/A  Psychiatric Specialty Exam:  Presentation  General Appearance: Appropriate for Environment; Casual; Fairly Groomed  Eye Contact:Fair  Speech:Clear and Coherent; Normal Rate; Pressured  Speech Volume:Normal  Handedness:Right  Mood and Affect  Mood:Anxious; Angry; Depressed  Affect:Blunt; Depressed  Thought Process  Thought Processes:Coherent; Linear  Descriptions of Associations:Intact  Orientation:Full (Time, Place and Person)  Thought Content:Rumination; WDL  History of Schizophrenia/Schizoaffective disorder:Yes  Duration of Psychotic Symptoms:Greater than six months  Hallucinations:Hallucinations: None Description of Auditory Hallucinations: Denied  Ideas of Reference:None  Suicidal Thoughts:Suicidal Thoughts: No SI Active Intent and/or Plan: -- (Denied)  Homicidal Thoughts:Homicidal Thoughts: No  Sensorium  Memory:Immediate Fair; Recent Fair; Remote Fair  Judgment:Fair  Insight:Fair  Executive Functions  Concentration:Fair  Attention Span:Fair  [place]  Language:Good  Psychomotor Activity  Psychomotor Activity:Psychomotor Activity: Normal  Assets  Assets:Communication Skills; Housing; Physical Health  Sleep  Sleep:Sleep: Good Number of Hours of Sleep: 8  Physical Exam: Physical Exam Vitals and nursing note reviewed.  Constitutional:      Appearance: Normal appearance.  HENT:     Head: Normocephalic and atraumatic.     Right Ear: External ear normal.     Left Ear: External ear normal.     Nose: Nose normal.      Mouth/Throat:     Mouth: Mucous membranes are moist.     Pharynx: Oropharynx is clear.  Eyes:     Extraocular Movements: Extraocular movements intact.     Conjunctiva/sclera: Conjunctivae normal.     Pupils: Pupils are equal, round, and reactive to light.  Cardiovascular:     Rate and Rhythm: Normal rate.     Pulses: Normal pulses.     Comments: BP 107/43, P 66. Nursing staff to recheck. Pulmonary:     Effort: Pulmonary effort is normal.  Abdominal:     Palpations: Abdomen is soft.  Genitourinary:    Comments: Deferred Musculoskeletal:        General: Normal range of motion.     Cervical back: Normal range of motion and neck supple.  Skin:    General: Skin is warm.  Neurological:     General: No focal deficit present.     Mental Status: She is alert and oriented to person, place, and time.  Psychiatric:        Behavior: Behavior normal.    Blood pressure (!) 107/43, pulse 66, temperature 98.5 F (36.9 C), temperature source Oral, resp. rate 20, height '5\' 6"'$  (1.676 m), weight 93.4 kg, SpO2 98 %. Body mass index is 33.25 kg/m.  Treatment Plan Summary: Daily contact with patient to assess and evaluate symptoms and progress in treatment and Medication management.   Treatment Plan Summary: Daily contact with patient to  assess and evaluate symptoms and progress in treatment   ASSESSMENT:   Diagnoses / Active Problems: -Bipolar disorder with psychotic features, current episode is depressed -GAD -History of autism -Reported borderline personality disorder -History of multiple suicide attempts   PLAN: Safety and Monitoring:             -- Involuntary admission to inpatient psychiatric unit for safety, stabilization and treatment             -- Daily contact with patient to assess and evaluate symptoms and progress in treatment             -- Patient's case to be discussed in multi-disciplinary team meeting             -- Observation Level : q15 minute checks              -- Vital signs:  q12 hours             -- Precautions: suicide, elopement, and assault   2. Psychiatric Diagnoses and Treatment:               -hold vraylar. OD was on 5-27 and half life of vraylar is 2-4 days.  Increased anxiety and restlessness could be 2/2 akathesia after overdose  -continue lexapro 10 mg once daily -restart and increase gabapentin to 300 mg tid  -continue ativan 1 mg PRN  -hold propranolol -increase vistaril to 25 mg tid for increased anxiety  -restart propanolol 10 mg bid for tachycardia and HTN   --  The risks/benefits/side-effects/alternatives to this medication were discussed in detail with the patient and time was given for questions. The patient consents to medication trial.                -- Metabolic profile and EKG monitoring obtained while on an atypical antipsychotic (BMI: Lipid Panel: HbgA1c: QTc:) 479             -- Encouraged patient to participate in unit milieu and in scheduled group therapies              -- Short Term Goals: Ability to identify changes in lifestyle to reduce recurrence of condition will improve, Ability to verbalize feelings will improve, Ability to disclose and discuss suicidal ideas, Ability to demonstrate self-control will improve, Ability to identify and develop effective coping behaviors will improve, Ability to maintain clinical measurements within normal limits will improve, Compliance with prescribed medications will improve, and Ability to identify triggers associated with substance abuse/mental health issues will improve             -- Long Term Goals: Improvement in symptoms so as ready for discharge           3. Medical Issues Being Addressed:              Tobacco Use Disorder             -- Nicotine patch '21mg'$ /24 hours ordered             -- Smoking cessation encouraged   4. Discharge Planning:              -- Social work and case management to assist with discharge planning and identification of hospital follow-up needs  prior to discharge             -- Estimated LOS: 5-7 days             -- Discharge Concerns: Need to establish a  safety plan; Medication compliance and effectiveness             -- Discharge Goals: Return home with outpatient referrals for mental health follow-up including medication management/psychotherapy   Observation Level/Precautions:  15 minute checks  Laboratory:  see above   Psychotherapy:    Medications:  See MAR  Consultations:  Pending  Discharge Concerns:  Safety  Estimated LOS: 5-7 days   Other:      Physician Treatment Plan for Primary Diagnosis: Bipolar I disorder, current or most recent episode depressed, with psychotic features [facility])   Physician Treatment Plan for Secondary Diagnosis: Principal Problem:   Bipolar I disorder, current or most recent episode depressed, with psychotic features ([place]) Active Problems:   Borderline personality disorder ([place])   Autism   GAD (generalized anxiety disorder)   Suicide attempt ([place])     I certify that inpatient services furnished can reasonably be expected to improve the patient's condition.    Laretta Bolster, FNP 07/09/2021, 11:52 AM Patient ID: [name], female   DOB: [birth year], 31 y.o.   MRN: [id number]

## 2021-07-09 NOTE — Progress Notes (Signed)
Adult Psychoeducational Group Note  Date:  07/09/2021 Time:  7:55 PM  Group Topic/Focus:  Wrap-Up Group:   The focus of this group is to help patients review their daily goal of treatment and discuss progress on daily workbooks.  Participation Level:  Active  Participation Quality:  Appropriate, Attentive, and Sharing  Affect:  Anxious and Appropriate  Cognitive:  Appropriate and Oriented  Insight: Lacking  Engagement in Group:  Engaged  Modes of Intervention:  Discussion and Support  Additional Comments:  today pt shared her day was stressful. Pt shares "I have a lot of bills to pay when I get out of here". Pt reports she has had high anxiety all day and was given ativan to help calm her down. Pt shares she received good news today and reports "My rental lady is going to help me with my rent".  [name] 07/09/2021, 7:55 PM

## 2021-07-10 ENCOUNTER — Encounter (HOSPITAL_COMMUNITY): Payer: Self-pay

## 2021-07-10 MED ORDER — HYDROXYZINE HCL 10 MG PO TABS
10.0000 mg | ORAL_TABLET | Freq: Three times a day (TID) | ORAL | Status: DC | PRN
  Administered 2021-07-10 – 2021-07-11 (×2): 10 mg via ORAL
  Filled 2021-07-10: qty 1

## 2021-07-10 MED ORDER — CARIPRAZINE HCL 3 MG PO CAPS
3.0000 mg | ORAL_CAPSULE | Freq: Every day | ORAL | Status: DC
  Administered 2021-07-11: 3 mg via ORAL
  Filled 2021-07-10 (×2): qty 1

## 2021-07-10 MED ORDER — SODIUM CHLORIDE 0.9 % IN NEBU
INHALATION_SOLUTION | RESPIRATORY_TRACT | Status: AC
  Filled 2021-07-10: qty 6

## 2021-07-10 MED ORDER — CARIPRAZINE HCL 1.5 MG PO CAPS
1.5000 mg | ORAL_CAPSULE | Freq: Every day | ORAL | Status: AC
Start: 2021-07-10 — End: 2021-07-10
  Administered 2021-07-10: 1.5 mg via ORAL
  Filled 2021-07-10 (×2): qty 1

## 2021-07-10 NOTE — Progress Notes (Signed)
[facility] MD Progress Note  07/10/2021 1:51 PM [name]  MRN:  [id number]  Subjective:   Patient is a 31 year old female with a reported past psychiatric history of "intellectual disability, autism, bipolar disorder, depression, anxiety, and borderline personality disorder" who was admitted to the psychiatric hospital from [facility] after suicide attempt via overdose on 2 weeks of: propranolol (280 mg per medical record), vryalar '3mg'$  once daily, gabapentin 200 mg tid, lexapro 10 mg once daily, and protonix. Patient was treated and medically cleared at [facility] prior to transfer and admission to the psychiatric unit.  On assessment today, the patient reports she feels anxious and is visibly anxious.  She is somewhat irritable.  Her speech is more rapid in rate but not pressured.  She is able to be interrupted.  She reports sleep is adequate denies any deficit of sleep or difficulty initiating sleep.  She reports some daytime fatigue attributed to Seroquel.  We discussed we will be switching Seroquel to Vraylar, and patient is agreeable. She reports that her mood is anxious and denies feeling down depressed or sad.  She was able to discuss the events leading up to her suicide attempt, and reports "it was impulsive.  I am not going to try to overdose again.  I tried to overdose many times in the past, but this time I ended up in the hospital and I felt that I almost died.  I do not want to do that again.  I am afraid of overdosing after this last time."  She is somewhat distractible since my last interview with the patient 4 days ago.  Reports appetite is okay. She denies having any suicidal thoughts at this time with the last 48 hours.  Denies having any homicidal thoughts.  Denies having any auditory hallucinations for about 4 days.  Denies any paranoid thoughts.  There are no obvious delusional thought constructs present during the interview today. She is future oriented.  Patient  reports she is worried about paying her bills and her rent, and is looking to be discharged tomorrow in order to fill these requirements.   Aside from daytime fatigue attributed to Seroquel, she denies any side effects to current scheduled psychiatric medications.  We discussed restarting Vraylar, as it has been about a day since overdose, and half-life of Vraylar is 2 to 4 days.  Patient is agreeable to this.  We will start at 1.5 mg once daily today, and increase the dose to 3 mg tomorrow, and monitor for side effects or signs of toxicity while restarting this medication.  Patient would also benefit from mood stabilization she had previously received while on Vraylar. She denies any somatic complaints including GI symptoms.  Pt agreeable to give consent for SW to call roommate (ex-bf) for safety planning.    Principal Problem: Bipolar I disorder, current or most recent episode depressed, with psychotic features ([place]) Diagnosis: Principal Problem:   Bipolar I disorder, current or most recent episode depressed, with psychotic features ([place]) Active Problems:   Borderline personality disorder ([place])   Autism   GAD (generalized anxiety disorder)   Suicide attempt ([place])  Total Time spent with patient: 20 minutes  Past Psychiatric History:  Diagnosis: Reported "intellectual disability, autism, bipolar disorder, depression, anxiety, borderline personality disorder" Patient reports history of multiple psychiatric hospitalizations Patient reports history of multiple suicide attempts Current medications: See above Past psychiatric medication history: " I have taken so many I cannot recall.  But I do recall that  Tegretol made blood come out of my mouth".   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 fragile X syndrome    Chronic constipation    Depression    Drug-seeking behavior     Essential tremor    Headache    Overdose of acetaminophen 07/2017   and other meds   Personality disorder (HCC)    Schizo-affective psychosis ([place])    Schizoaffective disorder, bipolar type ([place])    Seizures ([place])    Last seizure December 2017   Sleep apnea     Past Surgical History:  Procedure Laterality Date   MOUTH SURGERY  2009 or 2010   Family History:  Family History  Problem Relation Age of Onset   Mental illness Father    Asthma Father    PDD Brother    Seizures Brother    Family Psychiatric  History:  Psychiatric family history: Father: Depression.  Brother: Autism.  Patient reports father and 2 uncles attempted suicide.   Social History:  Social History   Substance and Sexual Activity  Alcohol Use No   Alcohol/week: 1.0 standard drink   Types: 1 Standard drinks or equivalent per week   Comment: denies at this time     Social History   Substance and Sexual Activity  Drug Use No   Comment: History of cocaine use at age 28 for 4 months    Social History   Socioeconomic History   Marital status: Widowed    Spouse name: Not on file   Number of children: 0   Years of education: Not on file   Highest education level: Not on file  Occupational History   Occupation: disability  Tobacco Use   Smoking status: Former    Packs/day: 0.00    Types: Cigarettes   Smokeless tobacco: Never   Tobacco comments:    Smoked for 2  years age [age]  Vaping Use   Vaping Use: Never used  Substance and Sexual Activity   Alcohol use: No    Alcohol/week: 1.0 standard drink    Types: 1 Standard drinks or equivalent per week    Comment: denies at this time   Drug use: No    Comment: History of cocaine use at age 10 for 4 months   Sexual activity: Not Currently    Birth control/protection: None  Other Topics Concern   Not on file  Social History Narrative   Marital status: Widowed      Children: daughter      Lives: with boyfriend, in two story home       Employment:  Disability      Tobacco: quit smoking; smoked for two years.      Alcohol ;none      Drugs: none   Has not traveled outside of the country.   Right handed         Social Determinants of Health   Financial Resource Strain: Not on file  Food Insecurity: Not on file  Transportation Needs: Not on file  Physical Activity: Not on file  Stress: Not on file  Social Connections: Not on file   Additional Social History:                         Sleep: Good  Appetite:  Good  Current Medications: Current Facility-Administered Medications  Medication Dose Route Frequency Provider Last Rate Last Admin   acetaminophen (TYLENOL) tablet 650 mg  650 mg Oral Q6H PRN Starkes-Perry, Gayland Curry, FNP       albuterol (PROVENTIL) (2.5 MG/3ML) 0.083% nebulizer solution 2.5 mg  2.5 mg Inhalation Q6H PRN Starkes-Perry, Gayland Curry, FNP       alum & mag hydroxide-simeth (MAALOX/MYLANTA) 200-200-20 MG/5ML suspension 30 mL  30 mL Oral Q4H PRN Starkes-Perry, Gayland Curry, FNP       benzocaine (ORAJEL) 10 % mucosal gel   Mouth/Throat BID PRN Bobbitt, Shalon E, NP   Given at 07/07/21 2107   calcium carbonate (TUMS - dosed in mg elemental calcium) chewable tablet 200 mg of elemental calcium  1 tablet Oral Daily PRN Suella Broad, FNP   200 mg of elemental calcium at 07/08/21 2136   [START ON 07/11/2021] cariprazine (VRAYLAR) capsule 3 mg  3 mg Oral Daily Latrisa Hellums, MD       diclofenac Sodium (VOLTAREN) 1 % topical gel 2 g  2 g Topical QID PRN Suella Broad, FNP   2 g at 07/07/21 1414   escitalopram (LEXAPRO) tablet 10 mg  10 mg Oral Daily Suella Broad, FNP   10 mg at 07/10/21 0729   feeding supplement (ENSURE ENLIVE / ENSURE PLUS) liquid 237 mL  237 mL Oral BID BM Ahmar Pickrell, MD   237 mL at 07/10/21 1051   fluticasone furoate-vilanterol (BREO ELLIPTA) 200-25 MCG/ACT 1 puff  1 puff Inhalation Daily Suella Broad, FNP   1 puff at 07/10/21 0730    gabapentin (NEURONTIN) capsule 300 mg  300 mg Oral TID Janine Limbo, MD   300 mg at 07/10/21 1144   hydrOXYzine (ATARAX) tablet 10 mg  10 mg Oral TID PRN Janine Limbo, MD   10 mg at 07/10/21 1147   ibuprofen (ADVIL) tablet 400 mg  400 mg Oral Q6H PRN Suella Broad, FNP   400 mg at 07/10/21 1147   LORazepam (ATIVAN) tablet 1 mg  1 mg Oral Q6H PRN Suella Broad, FNP   1 mg at 07/09/21 1558   risperiDONE (RISPERDAL M-TABS) disintegrating tablet 2 mg  2 mg Oral Q8H PRN Starkes-Perry, Gayland Curry, FNP       And   LORazepam (ATIVAN) tablet 1 mg  1 mg Oral PRN Starkes-Perry, Gayland Curry, FNP       And   ziprasidone (GEODON) injection 20 mg  20 mg Intramuscular PRN Starkes-Perry, Gayland Curry, FNP       magnesium hydroxide (MILK OF MAGNESIA) suspension 30 mL  30 mL Oral Daily PRN Starkes-Perry, Gayland Curry, FNP       melatonin tablet 3 mg  3 mg Oral QHS Suella Broad, FNP   3 mg at 07/09/21 2111   pantoprazole (PROTONIX) EC tablet 40 mg  40 mg Oral Daily Suella Broad, FNP   40 mg at 07/10/21 0731   polyethylene glycol (MIRALAX / GLYCOLAX) packet 17 g  17 g Oral Daily PRN Starkes-Perry, Gayland Curry, FNP       propranolol (INDERAL) tablet 10 mg  10 mg Oral BID Vale Peraza, Ovid Curd, MD   10 mg at 07/10/21 0731   QUEtiapine (SEROQUEL) tablet 50 mg  50 mg Oral QHS Nyasia Baxley, Ovid Curd, MD   50 mg at 07/09/21 2111   traZODone (DESYREL) tablet 50 mg  50 mg Oral QHS PRN Janine Limbo, MD  Lab Results: No results found for this or any previous visit (from the past 48 hour(s)).  Blood Alcohol level:  Lab Results  Component Value Date   ETH <10 07/01/2021   ETH <10 93/23/5573    Metabolic Disorder Labs: Lab Results  Component Value Date   HGBA1C 5.1 03/02/2021   MPG 99.67 03/02/2021   MPG 96.8 07/02/2019   Lab Results  Component Value Date   PROLACTIN 66.2 (H) 03/02/2021   PROLACTIN 66.1 (H) 02/04/2020   Lab Results  Component Value Date   CHOL 245 (H)  05/01/2021   TRIG 169 (H) 05/01/2021   HDL 46 05/01/2021   CHOLHDL 5.3 05/01/2021   VLDL 34 05/01/2021   LDLCALC 165 (H) 05/01/2021   LDLCALC 102 (H) 04/24/2021    Physical Findings: AIMS:  , ,  ,  ,    CIWA:    COWS:     Musculoskeletal: Strength & Muscle Tone: within normal limits Gait & Station: normal Patient leans: N/A  Psychiatric Specialty Exam:  Presentation  General Appearance: Casual  Eye Contact:Fair  Speech:Pressured  Speech Volume:Normal  Handedness:Right   Mood and Affect  Mood:Anxious; Irritable  Affect:Full Range   Thought Process  Thought Processes:Goal Directed; Linear  Descriptions of Associations:Intact  Orientation:Full (Time, Place and Person)  Thought Content:Logical  History of Schizophrenia/Schizoaffective disorder:No  Duration of Psychotic Symptoms:Less than six months  Hallucinations:Hallucinations: None Description of Auditory Hallucinations: reports last having any AH about 4 days ago  Ideas of Reference:None  Suicidal Thoughts:Suicidal Thoughts: No SI Active Intent and/or Plan: -- (Denied)  Homicidal Thoughts:Homicidal Thoughts: No   Sensorium  Memory:Immediate Good; Recent Good; Remote Good  Judgment:Fair  Insight:Shallow   Executive Functions  Concentration:Fair  Attention Span:Poor  [place]  Language:Good   Psychomotor Activity  Psychomotor Activity:Psychomotor Activity: Normal   Assets  Assets:Communication Skills; Housing; Physical Health   Sleep  Sleep:Sleep: Fair Number of Hours of Sleep: 8    Physical Exam: Physical Exam Vitals reviewed.  Constitutional:      General: She is not in acute distress.    Appearance: She is not toxic-appearing.  Pulmonary:     Effort: Pulmonary effort is normal.  Neurological:     Mental Status: She is alert.     Motor: No weakness.     Gait: Gait abnormal.   Review of Systems  Constitutional:  Negative for chills  and fever.  Cardiovascular:  Negative for chest pain and palpitations.  Gastrointestinal:  Negative for abdominal pain, constipation, diarrhea, nausea and vomiting.  Neurological:  Negative for dizziness, tingling, tremors and headaches.  Psychiatric/Behavioral:  Negative for depression, hallucinations, substance abuse and suicidal ideas. The patient is nervous/anxious. The patient does not have insomnia.    Blood pressure (!) 114/93, pulse 94, temperature 98.5 F (36.9 C), temperature source Oral, resp. rate 20, height '5\' 6"'$  (1.676 m), weight 93.4 kg, SpO2 100 %. Body mass index is 33.25 kg/m.   Treatment Plan Summary: Daily contact with patient to assess and evaluate symptoms and progress in treatment   ASSESSMENT:   Diagnoses / Active Problems: -Bipolar disorder with psychotic features, current episode is depressed -GAD -History of autism -Reported borderline personality disorder -History of multiple suicide attempts   PLAN: Safety and Monitoring:             -- Involuntary admission to inpatient psychiatric unit for safety, stabilization and treatment             -- Daily  contact with patient to assess and evaluate symptoms and progress in treatment             -- Patient's case to be discussed in multi-disciplinary team meeting             -- Observation Level : q15 minute checks             -- Vital signs:  q12 hours             -- Precautions: suicide, elopement, and assault   2. Psychiatric Diagnoses and Treatment:               -Re-start vraylar 1.5 mg once daily - for bipolar disorder. Then increase dose to 3 mg once daily tomorrow 07-11-21.  -continue lexapro 10 mg once daily -Continue gabapentin to 300 mg tid  -continue ativan 1 mg PRN  -Continue propranolol 10 mg bid  -Decrease vistaril to 10 mg tid for increased anxiety (decrease dose due to daytime fatigue)  -Continue Seroquel 50 mg qhs for mood stabilization (plan to dc at discharge as pt will be back on vraylar)     --  The risks/benefits/side-effects/alternatives to this medication were discussed in detail with the patient and time was given for questions. The patient consents to medication trial.                -- Metabolic profile and EKG monitoring obtained while on an atypical antipsychotic (BMI: Lipid Panel: HbgA1c: QTc:) 479             -- Encouraged patient to participate in unit milieu and in scheduled group therapies                       3. Medical Issues Being Addressed:              Tobacco Use Disorder             4. Discharge Planning:              -- Social work and case management to assist with discharge planning and identification of hospital follow-up needs prior to discharge             -- Estimated LOS: 5-7 days             -- Discharge Concerns: Need to establish a safety plan; Medication compliance and effectiveness             -- Discharge Goals: Return home with outpatient referrals for mental health follow-up including medication management/psychotherapy   Christoper Allegra, MD 07/10/2021, 1:51 PM  Total Time Spent in Direct Patient Care:  I personally spent 35 minutes on the unit in direct patient care. The direct patient care time included face-to-face time with the patient, reviewing the patient's chart, communicating with other professionals, and coordinating care. Greater than 50% of this time was spent in counseling or coordinating care with the patient regarding goals of hospitalization, psycho-education, and discharge planning needs.   Janine Limbo, MD Psychiatrist

## 2021-07-10 NOTE — BH IP Treatment Plan (Signed)
Interdisciplinary Treatment and Diagnostic Plan Update  07/10/2021 Time of Session: 0830 [name] MRN: [id number]  Principal Diagnosis: Bipolar I disorder, current or most recent episode depressed, with psychotic features ([place])  Secondary Diagnoses: Principal Problem:   Bipolar I disorder, current or most recent episode depressed, with psychotic features ([place]) Active Problems:   Borderline personality disorder ([place])   Autism   GAD (generalized anxiety disorder)   Suicide attempt ([place])   Current Medications:  Current Facility-Administered Medications  Medication Dose Route Frequency Provider Last Rate Last Admin   acetaminophen (TYLENOL) tablet 650 mg  650 mg Oral Q6H PRN Starkes-Perry, Gayland Curry, FNP       albuterol (PROVENTIL) (2.5 MG/3ML) 0.083% nebulizer solution 2.5 mg  2.5 mg Inhalation Q6H PRN Starkes-Perry, Gayland Curry, FNP       alum & mag hydroxide-simeth (MAALOX/MYLANTA) 200-200-20 MG/5ML suspension 30 mL  30 mL Oral Q4H PRN Starkes-Perry, Gayland Curry, FNP       benzocaine (ORAJEL) 10 % mucosal gel   Mouth/Throat BID PRN Bobbitt, Shalon E, NP   Given at 07/07/21 2107   calcium carbonate (TUMS - dosed in mg elemental calcium) chewable tablet 200 mg of elemental calcium  1 tablet Oral Daily PRN Suella Broad, FNP   200 mg of elemental calcium at 07/08/21 2136   cariprazine (VRAYLAR) capsule 1.5 mg  1.5 mg Oral Daily Massengill, Nathan, MD       Followed by   Derrill Memo ON 07/11/2021] cariprazine (VRAYLAR) capsule 3 mg  3 mg Oral Daily Massengill, Nathan, MD       diclofenac Sodium (VOLTAREN) 1 % topical gel 2 g  2 g Topical QID PRN Suella Broad, FNP   2 g at 07/07/21 1414   escitalopram (LEXAPRO) tablet 10 mg  10 mg Oral Daily Suella Broad, FNP   10 mg at 07/10/21 0729   feeding supplement (ENSURE ENLIVE / ENSURE PLUS) liquid 237 mL  237 mL Oral BID BM Massengill, Nathan, MD   237 mL at 07/09/21 1430   fluticasone furoate-vilanterol (BREO ELLIPTA)  200-25 MCG/ACT 1 puff  1 puff Inhalation Daily Suella Broad, FNP   1 puff at 07/10/21 0730   gabapentin (NEURONTIN) capsule 300 mg  300 mg Oral TID Janine Limbo, MD   300 mg at 07/10/21 0730   hydrOXYzine (ATARAX) tablet 10 mg  10 mg Oral TID PRN Massengill, Ovid Curd, MD       ibuprofen (ADVIL) tablet 400 mg  400 mg Oral Q6H PRN Suella Broad, FNP   400 mg at 07/07/21 1414   LORazepam (ATIVAN) tablet 1 mg  1 mg Oral Q6H PRN Suella Broad, FNP   1 mg at 07/09/21 1558   risperiDONE (RISPERDAL M-TABS) disintegrating tablet 2 mg  2 mg Oral Q8H PRN Starkes-Perry, Gayland Curry, FNP       And   LORazepam (ATIVAN) tablet 1 mg  1 mg Oral PRN Starkes-Perry, Gayland Curry, FNP       And   ziprasidone (GEODON) injection 20 mg  20 mg Intramuscular PRN Starkes-Perry, Gayland Curry, FNP       magnesium hydroxide (MILK OF MAGNESIA) suspension 30 mL  30 mL Oral Daily PRN Starkes-Perry, Gayland Curry, FNP       melatonin tablet 3 mg  3 mg Oral QHS Suella Broad, FNP   3 mg at 07/09/21 2111   pantoprazole (PROTONIX) EC tablet 40 mg  40 mg Oral Daily Starkes-Perry, Gayland Curry, FNP  40 mg at 07/10/21 0731   polyethylene glycol (MIRALAX / GLYCOLAX) packet 17 g  17 g Oral Daily PRN Suella Broad, FNP       propranolol (INDERAL) tablet 10 mg  10 mg Oral BID Massengill, Ovid Curd, MD   10 mg at 07/10/21 0731   QUEtiapine (SEROQUEL) tablet 50 mg  50 mg Oral QHS Massengill, Ovid Curd, MD   50 mg at 07/09/21 2111   traZODone (DESYREL) tablet 50 mg  50 mg Oral QHS PRN Massengill, Ovid Curd, MD       PTA Medications: Medications Prior to Admission  Medication Sig Dispense Refill Last Dose   albuterol (VENTOLIN HFA) 108 (90 Base) MCG/ACT inhaler Inhale 2 puffs into the lungs every 6 (six) hours as needed for wheezing or shortness of breath.      budesonide-formoterol (SYMBICORT) 160-4.5 MCG/ACT inhaler Inhale 2 puffs into the lungs 2 (two) times daily.      calcium carbonate (TUMS - DOSED IN MG ELEMENTAL  CALCIUM) 500 MG chewable tablet Chew 1 tablet by mouth daily as needed for indigestion or heartburn.      diclofenac Sodium (VOLTAREN) 1 % GEL Apply 2 g topically 4 (four) times daily. 100 g 0    ibuprofen (ADVIL) 200 MG tablet Take 400-600 mg by mouth every 6 (six) hours as needed for moderate pain or headache.      melatonin 3 MG TABS tablet Take 3 mg by mouth at bedtime.      pantoprazole (PROTONIX) 40 MG tablet TAKE 1 TABLET BY MOUTH DAILY (Patient taking differently: 40 mg daily.) 30 tablet 0     Patient Stressors: Marital or family conflict   Other: Wants other outpatient services    Patient Strengths: Supportive family/friends  Work skills   Treatment Modalities: Medication Management, Group therapy, Case management,  1 to 1 session with clinician, Psychoeducation, Recreational therapy.   Physician Treatment Plan for Primary Diagnosis: Bipolar I disorder, current or most recent episode depressed, with psychotic features ([place]) Long Term Goal(s): Improvement in symptoms so as ready for discharge   Short Term Goals: Ability to identify changes in lifestyle to reduce recurrence of condition will improve Ability to verbalize feelings will improve Ability to disclose and discuss suicidal ideas Ability to demonstrate self-control will improve Ability to identify and develop effective coping behaviors will improve Ability to maintain clinical measurements within normal limits will improve Compliance with prescribed medications will improve Ability to identify triggers associated with substance abuse/mental health issues will improve  Medication Management: Evaluate patient's response, side effects, and tolerance of medication regimen.  Therapeutic Interventions: 1 to 1 sessions, Unit Group sessions and Medication administration.  Evaluation of Outcomes: Progressing  Physician Treatment Plan for Secondary Diagnosis: Principal Problem:   Bipolar I disorder, current or most recent  episode depressed, with psychotic features ([place]) Active Problems:   Borderline personality disorder ([place])   Autism   GAD (generalized anxiety disorder)   Suicide attempt ([place])  Long Term Goal(s): Improvement in symptoms so as ready for discharge   Short Term Goals: Ability to identify changes in lifestyle to reduce recurrence of condition will improve Ability to verbalize feelings will improve Ability to disclose and discuss suicidal ideas Ability to demonstrate self-control will improve Ability to identify and develop effective coping behaviors will improve Ability to maintain clinical measurements within normal limits will improve Compliance with prescribed medications will improve Ability to identify triggers associated with substance abuse/mental health issues will improve     Medication Management:  Evaluate patient's response, side effects, and tolerance of medication regimen.  Therapeutic Interventions: 1 to 1 sessions, Unit Group sessions and Medication administration.  Evaluation of Outcomes: Progressing   RN Treatment Plan for Primary Diagnosis: Bipolar I disorder, current or most recent episode depressed, with psychotic features ([place]) Long Term Goal(s): Knowledge of disease and therapeutic regimen to maintain health will improve  Short Term Goals: Ability to remain free from injury will improve, Ability to verbalize frustration and anger appropriately will improve, Ability to demonstrate self-control, Ability to participate in decision making will improve, Ability to verbalize feelings will improve, Ability to disclose and discuss suicidal ideas, Ability to identify and develop effective coping behaviors will improve, and Compliance with prescribed medications will improve  Medication Management: RN will administer medications as ordered by provider, will assess and evaluate patient's response and provide education to patient for prescribed medication. RN will report any  adverse and/or side effects to prescribing provider.  Therapeutic Interventions: 1 on 1 counseling sessions, Psychoeducation, Medication administration, Evaluate responses to treatment, Monitor vital signs and CBGs as ordered, Perform/monitor CIWA, COWS, AIMS and Fall Risk screenings as ordered, Perform wound care treatments as ordered.  Evaluation of Outcomes: Progressing   LCSW Treatment Plan for Primary Diagnosis: Bipolar I disorder, current or most recent episode depressed, with psychotic features ([place]) Long Term Goal(s): Safe transition to appropriate next level of care at discharge, Engage patient in therapeutic group addressing interpersonal concerns.  Short Term Goals: Engage patient in aftercare planning with referrals and resources, Increase social support, Increase ability to appropriately verbalize feelings, Increase emotional regulation, Facilitate acceptance of mental health diagnosis and concerns, Facilitate patient progression through stages of change regarding substance use diagnoses and concerns, Identify triggers associated with mental health/substance abuse issues, and Increase skills for wellness and recovery  Therapeutic Interventions: Assess for all discharge needs, 1 to 1 time with Social worker, Explore available resources and support systems, Assess for adequacy in community support network, Educate family and significant other(s) on suicide prevention, Complete Psychosocial Assessment, Interpersonal group therapy.  Evaluation of Outcomes: Progressing   Progress in Treatment: Attending groups: Yes. Participating in groups: Yes. Taking medication as prescribed: Yes. Toleration medication: Yes. Family/Significant other contact made: No, will contact:  Patient has declined consent for CSW to reach collateral.  Patient understands diagnosis: Yes. Discussing patient identified problems/goals with staff: Yes. Medical problems stabilized or resolved: Yes. Denies  suicidal/homicidal ideation: No. Issues/concerns per patient self-inventory: Yes. Other: none   New problem(s) identified: No, Describe:  none   New Short Term/Long Term Goal(s): Patient to work towards  elimination of symptoms of psychosis, medication management for mood stabilization; elimination of SI thoughts; development of comprehensive mental wellness plan.  Patient Goals:  No additional goals identified at this time. Patient to continue to work towards original goals identified in initial treatment team meeting. CSW will remain available to patient should they voice additional treatment goals.   Discharge Plan or Barriers: No psychosocial barriers identified at this time, patient to return to place of residence when appropriate for discharge.   Reason for Continuation of Hospitalization: Depression Other; describe psychosis   Estimated Length of Stay: 1-7 days   Last 3 [place] Suicide Severity Risk Score: [place] Admission (Current) from 07/04/2021 in [facility] ED to Hosp-Admission (Discharged) from 07/01/2021 in [facility] ED from 06/26/2021 in [facility] No Risk High Risk High Risk  Last PHQ 2/9 Scores:    06/27/2021    3:18 AM 05/01/2021    2:16 AM 02/03/2020    2:24 AM  Depression screen PHQ 2/9  Decreased Interest '1 2 3  '$ Down, Depressed, Hopeless '2 3 3  '$ PHQ - 2 Score '3 5 6  '$ Altered sleeping 0 0 3  Tired, decreased energy '1 1 3  '$ Change in appetite 0 1 3  Feeling bad or failure about yourself  '1 3 3  '$ Trouble concentrating '2 3 3  '$ Moving slowly or fidgety/restless '1 1 3  '$ Suicidal thoughts '1 3 3  '$ PHQ-9 Score '9 17 27  '$ Difficult doing work/chores Somewhat difficult Extremely dIfficult     Scribe for Treatment Team: [name] 07/10/2021 11:29 AM

## 2021-07-10 NOTE — Group Note (Signed)
LCSW Group Therapy Note   Group Date: 07/10/2021 Start Time: 1300 End Time: 1400  Type of Therapy and Topic:  Group Therapy:  Feelings and Emotions in the Body   Participation Level:  Did Not Attend   Description of Group This process group involved patients discussing their feelings and emotions and where they feel those emotions within their bodies.  These feelings and emotions were named and described by using an emotion wheel.  The group then brainstormed specific ways in which they could begin to notice their emotions and feelings more often before participating in an unwanted behavior.   Therapeutic Goals Patient will identify and describe positive and negative feelings.  Patient will identify where these feelings and emotions take place within their body. Patients will brainstorm together ways they can begin to notice their emotional body responses in the outpatient setting/environment. Patients will identify barriers to wellness and possible solutions to help achieve better emotion regulation.   Summary of Patient Progress:  Did not attend   Therapeutic Modalities Cognitive Behavioral Therapy Motivational Interviewing  Darleen Crocker, [place] 07/10/2021  1:28 PM

## 2021-07-10 NOTE — Progress Notes (Addendum)
D:  Patient has been pacing on 400 hallway.  Patient upset, talking about discharge today.  MDs have talked to patient and patient has calmed herself.  Patient has declined anxiety/agitation medications this morning while talking to nurse,   A:  Medications administered per MD orders.  Emotional support and encouragement given patient. R:  Denied SI and HI, contracts for safety.  Denied A/V hallucinations.  Safety maintained with 15 minute checks.  Patient took atarax and ibuprofen this morning.  Patient is calmer after talking to MDs this morning.  Patient said her ex husband will bring her card number tonight for her to pay her phone bill.

## 2021-07-10 NOTE — Progress Notes (Signed)
The patient had little to share about her day in group.She indicated that she had a "slow day" but did not explain further. On a positive note, she verbalized that she is going to be discharged tomorrow. She did not have a goal for today.

## 2021-07-10 NOTE — BHH Counselor (Signed)
CSW spoke with the Pt about providing a name and phone number of someone to complete Safety Planning (SPE) with.  The Pt states that she does not have anyone that can be contacted at this time.  The Pt agrees to allow CSW to contact the CST Team if needed.  The Pt states that she would like to be discharged today because "I have bills to pay and they are only giving me today or tomorrow to pay them".  CSW will inform the providers of this information.

## 2021-07-10 NOTE — Plan of Care (Signed)
Nurse discussed anxiety, depression and coping skills with patient.  

## 2021-07-11 MED ORDER — TRAZODONE HCL 50 MG PO TABS: 50.0000 mg | ORAL_TABLET | Freq: Every evening | ORAL | Status: DC | PRN

## 2021-07-11 MED ORDER — GABAPENTIN 100 MG PO CAPS: 200.0000 mg | ORAL_CAPSULE | Freq: Three times a day (TID) | ORAL | Status: DC

## 2021-07-11 MED ORDER — CARIPRAZINE HCL 3 MG PO CAPS
3.0000 mg | ORAL_CAPSULE | Freq: Every day | ORAL | Status: DC
Start: 2021-07-12 — End: 2021-12-11

## 2021-07-11 MED ORDER — PROPRANOLOL HCL 10 MG PO TABS: 10.0000 mg | ORAL_TABLET | Freq: Two times a day (BID) | ORAL | Status: DC

## 2021-07-11 MED ORDER — GABAPENTIN 300 MG PO CAPS: 300.0000 mg | ORAL_CAPSULE | Freq: Three times a day (TID) | ORAL | 0 refills | Status: DC

## 2021-07-11 MED ORDER — ESCITALOPRAM OXALATE 10 MG PO TABS
10.0000 mg | ORAL_TABLET | Freq: Every day | ORAL | Status: DC
Start: 2021-07-11 — End: 2021-09-04

## 2021-07-11 MED ORDER — QUETIAPINE FUMARATE 50 MG PO TABS
50.0000 mg | ORAL_TABLET | Freq: Every day | ORAL | 0 refills | Status: DC
Start: 2021-07-11 — End: 2021-09-04

## 2021-07-11 MED ORDER — HYDROXYZINE HCL 10 MG PO TABS: 10.0000 mg | ORAL_TABLET | Freq: Three times a day (TID) | ORAL | 0 refills | Status: DC | PRN

## 2021-07-11 NOTE — [facility] Suicide Risk Assessment (Signed)
[facility] INPATIENT:  Family/Significant Other Suicide Prevention Education  Suicide Prevention Education:  Education Completed; Rudene Re [phone] (CST Team Lead) has been identified by the patient as the family member/significant other with whom the patient will be residing, and identified as the person(s) who will aid the patient in the event of a mental health crisis (suicidal ideations/suicide attempt).  With written consent from the patient, the family member/significant other has been provided the following suicide prevention education, prior to the and/or following the discharge of the patient.  The suicide prevention education provided includes the following: Suicide risk factors Suicide prevention and interventions National Suicide Hotline telephone number [facility] assessment telephone number [facility] Emergency Assistance [place] and/or Residential Mobile Crisis Unit telephone number  Request made of family/significant other to: Remove weapons (e.g., guns, rifles, knives), all items previously/currently identified as safety concern.   Remove drugs/medications (over-the-counter, prescriptions, illicit drugs), all items previously/currently identified as a safety concern.  The family member/significant other verbalizes understanding of the suicide prevention education information provided.  The family member/significant other agrees to remove the items of safety concern listed above.  CSW spoke with Mrs. Charlean Merl who states that she will be able to come to the Pt's home today at 2:00pm after discharge to meet with her.  She states that she has no concerns or questions at this time.  She states that she also met with the Pt in the Emergency Room and has discussed the steps moving forward with her.  She reports no firearms or weapons in the home.  CSW completed SPE with Mrs. Shafer.   Frutoso Chase Ahlaya Ende 07/11/2021, 9:32 AM

## 2021-07-11 NOTE — Progress Notes (Signed)
   07/10/21 2120  Psych Admission Type (Psych Patients Only)  Admission Status Involuntary  Psychosocial Assessment  Patient Complaints Anxiety  Eye Contact Fair  Facial Expression Anxious  Affect Labile;Anxious  Speech Logical/coherent  Interaction Attention-seeking  Motor Activity Fidgety  Appearance/Hygiene Disheveled  Behavior Characteristics Anxious  Mood Anxious  Thought Process  Coherency Tangential  Content Blaming others  Delusions None reported or observed  Perception WDL  Hallucination None reported or observed  Judgment Poor  Confusion None  Danger to Self  Current suicidal ideation? Denies  Danger to Others  Danger to Others None reported or observed

## 2021-07-11 NOTE — [facility] Suicide Risk Assessment (Signed)
Suicide Risk Assessment  Discharge Assessment    [facility] Discharge Suicide Risk Assessment   Principal Problem: Bipolar I disorder, current or most recent episode depressed, with psychotic features [facility]) Discharge Diagnoses: Principal Problem:   Bipolar I disorder, current or most recent episode depressed, with psychotic features ([place]) Active Problems:   Borderline personality disorder ([place])   Autism   GAD (generalized anxiety disorder)   Suicide attempt ([place])  Patient is a 31 year old female with a reported past psychiatric history of "intellectual disability, autism, bipolar disorder, depression, anxiety, and borderline personality disorder" who was admitted to the psychiatric hospital from [facility] after suicide attempt via overdose on 2 weeks of: propranolol (280 mg per medical record), vryalar '3mg'$  once daily, gabapentin 200 mg tid, lexapro 10 mg once daily, and protonix. Patient was treated and medically cleared at [facility] prior to transfer and admission to the psychiatric unit.   During the patient's hospitalization, patient had extensive initial psychiatric evaluation, and follow-up psychiatric evaluations every day.  Psychiatric diagnoses provided upon initial assessment: Bipolar I disorder, current or most recent episode depressed, with psychotic features   Patient's psychiatric medications were adjusted on admission: She was restarted on her Lexapro and Propanolol.  She was restarted and increased on her Gabapentin.  Given her Overdose on Vraylar this was held.  During the hospitalization, other adjustments were made to the patient's psychiatric medication regimen: She was started on low dose Seroquel for help with sleep and mood stabilization.  After sufficient time her Arman Filter was also restarted.  Gradually, patient started adjusting to milieu.   Patient's care was discussed during the interdisciplinary team meeting every day during the hospitalization.  The patient  is not having side effects to prescribed psychiatric medication.  The patient reports their target psychiatric symptoms of depression, paranoia, and SI responded well to the psychiatric medications, and the patient reports overall benefit other psychiatric hospitalization. Supportive psychotherapy was provided to the patient. The patient also participated in regular group therapy while admitted.   Labs were reviewed with the patient, and abnormal results were discussed with the patient.  The patient denied having suicidal thoughts more than 48 hours prior to discharge.  Patient denies having homicidal thoughts.  Patient denies having auditory hallucinations.  Patient denies any visual hallucinations.  Patient denies having paranoid thoughts.  The patient is able to verbalize their individual safety plan to this provider.  It is recommended to the patient to continue psychiatric medications as prescribed, after discharge from the hospital.    It is recommended to the patient to follow up with your outpatient psychiatric provider and PCP.  Discussed with the patient, the impact of alcohol, drugs, tobacco have been there overall psychiatric and medical wellbeing, and total abstinence from substance use was recommended the patient.  Total Time spent with patient: 20 minutes  Musculoskeletal: Strength & Muscle Tone: within normal limits Gait & Station: normal Patient leans: N/A  Psychiatric Specialty Exam  Presentation  General Appearance: Casual  Eye Contact:Good  Speech:Normal Rate  Speech Volume:Normal  Handedness:Right   Mood and Affect  Mood:Euthymic  Duration of Depression Symptoms: Greater than two weeks  Affect:Congruent; Full Range   Thought Process  Thought Processes:Linear  Descriptions of Associations:Intact  Orientation:Full (Time, Place and Person)  Thought Content:Logical  History of Schizophrenia/Schizoaffective disorder:No  Duration of Psychotic  Symptoms:Less than six months  Hallucinations:Hallucinations: None Description of Auditory Hallucinations: denies current AH  Ideas of Reference:None (denies any paranoia)  Suicidal Thoughts:Suicidal Thoughts: No  Homicidal Thoughts:Homicidal Thoughts: No   Sensorium  Memory:Immediate Good; Recent Good; Remote Good  Judgment:Fair  Insight:Fair   Executive Functions  Concentration:Fair  Attention Span:Fair  [place]  Language:Good   Psychomotor Activity  Psychomotor Activity:Psychomotor Activity: -- ([place]: 1 tongue, 2 LE movement = total score 3. no eps on exam today.)   Assets  Assets:Communication Skills; Physical Health; Housing (has CST team)   Sleep  Sleep:Sleep: Good Number of Hours of Sleep: 8.5   Physical Exam: Physical Exam Vitals and nursing note reviewed.  Constitutional:      General: She is not in acute distress.    Appearance: Normal appearance. She is obese. She is not ill-appearing or toxic-appearing.  HENT:     Head: Normocephalic and atraumatic.  Pulmonary:     Effort: Pulmonary effort is normal.  Musculoskeletal:        General: Normal range of motion.  Neurological:     General: No focal deficit present.     Mental Status: She is alert.   Review of Systems  Respiratory:  Negative for cough and shortness of breath.   Cardiovascular:  Negative for chest pain.  Gastrointestinal:  Negative for abdominal pain, constipation, diarrhea, nausea and vomiting.  Neurological:  Negative for dizziness, weakness and headaches.  Psychiatric/Behavioral:  Negative for depression, hallucinations and suicidal ideas. The patient is not nervous/anxious.    Blood pressure (!) 107/56, pulse 93, temperature 98 F (36.7 C), temperature source Oral, resp. rate 20, height '5\' 6"'$  (1.676 m), weight 93.4 kg, SpO2 99 %. Body mass index is 33.25 kg/m.    07/11/21 1002  Facial and Oral Movements  Muscles of Facial Expression 0   Lips and Perioral Area 1  Jaw 0  Tongue 0  Extremity Movements  Upper (arms, wrists, hands, fingers) 0  Lower (legs, knees, ankles, toes) 2  Trunk Movements  Neck, shoulders, hips 0  Overall Severity  Severity of abnormal movements (highest score from questions above) 2  Incapacitation due to abnormal movements 0  Patient's awareness of abnormal movements (rate only patient's report) 0  Dental Status  Current problems with teeth and/or dentures? No  Does patient usually wear dentures? No  AIMS Total Score  AIMS Total Score 3   No Cogwheeling or Rigidity Present  Mental Status Per Nursing Assessment::   On Admission:  NA  Demographic Factors:  Caucasian  Loss Factors: NA  Historical Factors: Prior suicide attempts and Impulsivity  Risk Reduction Factors:   Has CST Team  Continued Clinical Symptoms:  More than one psychiatric diagnosis Previous Psychiatric Diagnoses and Treatments Medical Diagnoses and Treatments/Surgeries  Cognitive Features That Contribute To Risk:  Loss of executive function    Suicide Risk:  Mild:  Patient reports no SI, however, patient is impulsive and has a history of prior suicide attempts.  There are no identifiable plans, no associated intent, mild dysphoria and related symptoms, good self-control (both objective and subjective assessment), few other risk factors, and identifiable protective factors, including available and accessible social support.   Follow-up Information     [facility] Follow up on 07/14/2021.   Why: You have a hospital follow up appointment for therapy and medication management services on 07/14/21 at 10:30 am.   This appointment will be Virtual telehealth.  Please continue with this provider's [facility] in [place], [place]. Contact information: [place] [address] [place], [place] on 07/12/2021.  Why: You have an appointment with your primary care  provider on  07/12/21 at 8:15 am.  This appointment will be held in person. Contact information: [address] [place] [address] [phone]                 Plan Of Care/Follow-up recommendations:  Activity: as tolerated  Diet: heart healthy  Other: -Follow-up with your outpatient psychiatric provider -instructions on appointment date, time, and address (location) are provided to you in discharge paperwork.  -Take your psychiatric medications as prescribed at discharge - instructions are provided to you in the discharge paperwork  -Follow-up with outpatient primary care doctor and other specialists -for management of chronic medical disease, including: You will need routine monitoring of your- weight, A1c, Lipid Panel, CMP, CBC, and EKG because of your antipsychotic.   -Testing: Follow-up with outpatient provider for abnormal lab results: None  -Recommend abstinence from alcohol, tobacco, and other illicit drug use at discharge.   -If your psychiatric symptoms recur, worsen, or if you have side effects to your psychiatric medications, call your outpatient psychiatric provider, 911, [phone] or go to the nearest emergency department.  -If suicidal thoughts recur, call your outpatient psychiatric provider, 911, [phone] or go to the nearest emergency department.   Briant Cedar, MD 07/11/2021, 10:18 AM  Total Time Spent in Direct Patient Care:  I personally spent 40 minutes on the unit in direct patient care. The direct patient care time included face-to-face time with the patient, reviewing the patient's chart, communicating with other professionals, and coordinating care. Greater than 50% of this time was spent in counseling or coordinating care with the patient regarding goals of hospitalization, psycho-education, and discharge planning needs.  On my assessment the patient denied SI, HI, AVH, paranoia, ideas of reference, or first rank symptoms on day of discharge. Patient  denied drug cravings or active signs of withdrawal. Patient denied medication side-effects. Patient was not deemed to be a danger to self or others on day of discharge and was in agreement with discharge plans.   I have independently evaluated the patient during a face-to-face assessment on the day of discharge. I reviewed the patient's chart, and I participated in key portions of the service. I discussed the case with the resident physician, and I agree with the assessment and plan of care as documented in the resident physician's note, as addended by me or notated below:  I agree with the [place].   Janine Limbo, MD Psychiatrist

## 2021-07-11 NOTE — Progress Notes (Signed)
  [facility] Adult Case Management Discharge Plan :  Will you be returning to the same living situation after discharge:  Yes,  Home  At discharge, do you have transportation home?: Yes,  Taxi  Do you have the ability to pay for your medications: Yes,  [employer]   Release of information consent forms completed and in the chart;  Patient's signature needed at discharge.  Patient to Follow up at:  Follow-up Information     [facility] Follow up on 07/14/2021.   Why: You have a hospital follow up appointment for therapy and medication management services on 07/14/21 at 10:30 am.   This appointment will be Virtual telehealth.  Please continue with this provider's [facility] in [place], [place]. Contact information: [place] [address] [place], [place] on 07/12/2021.   Why: You have an appointment with your primary care provider on  07/12/21 at 8:15 am.  This appointment will be held in person. Contact information: [place] [address] [phone]                 Next level of care provider has access to [place] and Suicide Prevention discussed: Yes,  with patient and CST Team      Has patient been referred to the Quitline?: Patient refused referral  Patient has been referred for addiction treatment: [facility], [place] 07/11/2021, 9:38 AM

## 2021-07-11 NOTE — Discharge Summary (Signed)
Physician Discharge Summary Note  Patient:  [name] is an 31 y.o., female MRN:  [id number] DOB:  15-May-1990 Patient phone:  [phone] (home)  Patient address:   [place] [address],  Total Time spent with patient: 20 minutes  Date of Admission:  07/04/2021 Date of Discharge: 07/11/2021  Reason for Admission:   Patient is a 31 year old female with a reported past psychiatric history of "intellectual disability, autism, bipolar disorder, depression, anxiety, and borderline personality disorder" who was admitted to the psychiatric hospital from [facility] after suicide attempt via overdose on 2 weeks of: propranolol (280 mg per medical record), vryalar '3mg'$  once daily, gabapentin 200 mg tid, lexapro 10 mg once daily, and protonix. Patient was treated and medically cleared at [facility] prior to transfer and admission to the psychiatric unit.   Reported home psychiatric medications: Vraylar 3 mg once daily, Lexapro 10 mg once daily, Vistaril 10 mg as needed, gabapentin 200 mg 3 times daily, propranolol.   On assessment today, the patient reports that prior to his current suicide attempt, she had attempted suicide by overdose the week prior.  She reports she had attempted suicide "because I was not being treated respectfully and no one would listen to me".  She reports multiple stressors contributing to worsening depressed mood, worsening depression, and paranoia including her ex-husband's family posting on Facebook that she killed him (patient states that this person overdosed on medication and died), other interpersonal conflicts and also financial stressors including leaving her job last week.  She reports having suicidal thoughts for "a couple of days" prior to this most recent suicide attempt.  She reports worsening depressed mood, increased anxiety, increased paranoid thoughts for a couple of weeks.  Principal Problem: Bipolar I disorder,  current or most recent episode depressed, with psychotic features [facility]) Discharge Diagnoses: Principal Problem:   Bipolar I disorder, current or most recent episode depressed, with psychotic features ([place]) Active Problems:   Borderline personality disorder ([place])   Autism   GAD (generalized anxiety disorder)   Suicide attempt ([place])   Past Psychiatric History:  Diagnosis: Reported "intellectual disability, autism, bipolar disorder, depression, anxiety, borderline personality disorder" Patient reports history of multiple psychiatric hospitalizations Patient reports history of multiple suicide attempts Current medications: See above Past psychiatric medication history: " I have taken so many I cannot recall.  But I do recall that Tegretol made blood come out of my mouth".   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 fragile X syndrome    Chronic constipation    Depression    Drug-seeking behavior    Essential tremor    Headache    Overdose of acetaminophen 07/2017   and other meds   Personality disorder (HCC)    Schizo-affective psychosis ([place])    Schizoaffective disorder, bipolar type ([place])    Seizures ([place])    Last seizure December 2017   Sleep apnea     Past Surgical History:  Procedure Laterality Date   MOUTH SURGERY  2009 or 2010   Family History:  Family History  Problem Relation Age of Onset   Mental illness Father    Asthma Father    PDD Brother    Seizures Brother    Family Psychiatric  History: Father: Depression.  Brother: Autism.  Patient  reports father and 2 uncles attempted suicide. Social History:  Social History   Substance and Sexual Activity  Alcohol Use No   Alcohol/week: 1.0 standard drink   Types: 1 Standard drinks or equivalent per week   Comment: denies at this time     Social History   Substance and Sexual Activity  Drug Use  No   Comment: History of cocaine use at age 32 for 4 months    Social History   Socioeconomic History   Marital status: Widowed    Spouse name: Not on file   Number of children: 0   Years of education: Not on file   Highest education level: Not on file  Occupational History   Occupation: disability  Tobacco Use   Smoking status: Former    Packs/day: 0.00    Types: Cigarettes   Smokeless tobacco: Never   Tobacco comments:    Smoked for 2  years age [age]  Vaping Use   Vaping Use: Never used  Substance and Sexual Activity   Alcohol use: No    Alcohol/week: 1.0 standard drink    Types: 1 Standard drinks or equivalent per week    Comment: denies at this time   Drug use: No    Comment: History of cocaine use at age 24 for 4 months   Sexual activity: Not Currently    Birth control/protection: None  Other Topics Concern   Not on file  Social History Narrative   Marital status: Widowed      Children: daughter      Lives: with boyfriend, in two story home      Employment:  Disability      Tobacco: quit smoking; smoked for two years.      Alcohol ;none      Drugs: none   Has not traveled outside of the country.   Right handed         Social Determinants of Health   Financial Resource Strain: Not on file  Food Insecurity: Not on file  Transportation Needs: Not on file  Physical Activity: Not on file  Stress: Not on file  Social Connections: Not on file    Hospital Course:   During the patient's hospitalization, patient had extensive initial psychiatric evaluation, and follow-up psychiatric evaluations every day.  Psychiatric diagnoses provided upon initial assessment: Bipolar I disorder, current or most recent episode depressed, with psychotic features   Patient's psychiatric medications were adjusted on admission: She was restarted on her Lexapro and Propanolol.  She was restarted and increased on her Gabapentin.  Given her Overdose on Vraylar this was  held.  During the hospitalization, other adjustments were made to the patient's psychiatric medication regimen: She was started on low dose Seroquel for help with sleep and mood stabilization.  After sufficient time her Arman Filter was also restarted.  Patient's care was discussed during the interdisciplinary team meeting every day during the hospitalization.  The patient is not having side effects to prescribed psychiatric medication.  Gradually, patient started adjusting to milieu. The patient was evaluated each day by a clinical provider to ascertain response to treatment. Improvement was noted by the patient's report of decreasing symptoms, improved sleep and appetite, affect, medication tolerance, behavior, and participation in unit programming.  Patient was asked each day to complete a self inventory noting mood, mental status, pain, new symptoms, anxiety and concerns.   Symptoms were reported as significantly decreased or resolved completely by discharge.  The patient reports that their mood  is stable.  The patient denied having suicidal thoughts for more than 48 hours prior to discharge.  Patient denies having homicidal thoughts.  Patient denies having auditory hallucinations.  Patient denies any visual hallucinations or other symptoms of psychosis.  The patient was motivated to continue taking medication with a goal of continued improvement in mental health.   The patient reports their target psychiatric symptoms of depression, paranoia, and SI responded well to the psychiatric medications, and the patient reports overall benefit other psychiatric hospitalization. Supportive psychotherapy was provided to the patient. The patient also participated in regular group therapy while hospitalized. Coping skills, problem solving as well as relaxation therapies were also part of the unit programming.  Labs were reviewed with the patient, and abnormal results were discussed with the patient.  The  patient is able to verbalize their individual safety plan to this provider.  # It is recommended to the patient to continue psychiatric medications as prescribed, after discharge from the hospital.    # It is recommended to the patient to follow up with your outpatient psychiatric provider and PCP.  # It was discussed with the patient, the impact of alcohol, drugs, tobacco have been there overall psychiatric and medical wellbeing, and total abstinence from substance use was recommended the patient.ed.  # Prescriptions provided or sent directly to preferred pharmacy at discharge. Patient agreeable to plan. Given opportunity to ask questions. Appears to feel comfortable with discharge.    # In the event of worsening symptoms, the patient is instructed to call the crisis hotline, 911 and or go to the nearest ED for appropriate evaluation and treatment of symptoms. To follow-up with primary care provider for other medical issues, concerns and or health care needs  # Patient was discharged home with a plan to follow up as noted below.    On day of discharge she reports she is feeling good and ready to go home.  She reports that her therapist from her CST team will meet her at 2:00.  She states she really likes this therapist and her entire team.  She states she plans to do some grocery shopping prior to her appointment.  She reports she slept well last night.  She reports her appetite is doing good.  She reports no SI, HI, or AVH.  She reports no [place], Ideas of Reference, or other First Rank symptoms.  Discussed with her that we started the Seroquel because she needed mood stabilization while we were waiting for her Vraylar overdose to metabolize through her system before we could restart the Vraylar.  Discussed with her that her outpatient provider could taper her off of it if she chooses to.  Discussed with her what to do in the event of a future crisis.  Discussed that she can return to [facility], go to  the [facility], go to the nearest ED, or call 911 or [phone].   She reported understanding and had no concerns.     Physical Findings: AIMS: Facial and Oral Movements Muscles of Facial Expression: None, normal Lips and Perioral Area: Minimal Jaw: None, normal Tongue: None, normal,Extremity Movements Upper (arms, wrists, hands, fingers): None, normal Lower (legs, knees, ankles, toes): Mild, Trunk Movements Neck, shoulders, hips: None, normal, Overall Severity Severity of abnormal movements (highest score from questions above): Mild Incapacitation due to abnormal movements: None, normal Patient's awareness of abnormal movements (rate only patient's report): No Awareness, Dental Status Current problems with teeth and/or dentures?: No Does patient usually wear dentures?: No  No Cogwheeling or Rigidity Present.   Musculoskeletal: Strength & Muscle Tone: within normal limits Gait & Station: normal Patient leans: N/A   Psychiatric Specialty Exam:  Presentation  General Appearance: Appropriate for Environment; Casual; Fairly Groomed  Eye Contact:Good  Speech:Normal Rate  Speech Volume:Normal  Handedness:Right   Mood and Affect  Mood:Euthymic  Affect:Congruent; Full Range   Thought Process  Thought Processes:Linear  Descriptions of Associations:Intact  Orientation:Full (Time, Place and Person)  Thought Content:Logical No SI, HI, or AVH. No Paranoia, Ideas of Reference, or First Rank symptoms.  History of Schizophrenia/Schizoaffective disorder:No  Duration of Psychotic Symptoms:Less than six months  Hallucinations:Hallucinations: None Description of Auditory Hallucinations: denies current AH  Ideas of Reference:None (denies any paranoia)  Suicidal Thoughts:Suicidal Thoughts: No  Homicidal Thoughts:Homicidal Thoughts: No   Sensorium  Memory:Immediate Good; Recent Good; Remote Good  Judgment:Fair  Insight:Fair   Executive Functions   Concentration:Fair  Attention Span:Fair  [place]  Language:Good   Psychomotor Activity  Psychomotor Activity:Psychomotor Activity: -- (Aims: 1 tongue, 2 LE movement = total score 3. no eps on exam today.)   Assets  Assets:Communication Skills; Physical Health; Housing (has CST team)   Sleep  Sleep:Sleep: Good Number of Hours of Sleep: 8.5    Physical Exam: Physical Exam Vitals and nursing note reviewed.  Constitutional:      General: She is not in acute distress.    Appearance: Normal appearance. She is obese. She is not ill-appearing or toxic-appearing.  HENT:     Head: Normocephalic and atraumatic.  Pulmonary:     Effort: Pulmonary effort is normal.  Musculoskeletal:        General: Normal range of motion.  Neurological:     General: No focal deficit present.     Mental Status: She is alert.   Review of Systems  Respiratory:  Negative for cough and shortness of breath.   Cardiovascular:  Negative for chest pain.  Gastrointestinal:  Negative for abdominal pain, constipation, diarrhea, nausea and vomiting.  Neurological:  Negative for dizziness, weakness and headaches.  Psychiatric/Behavioral:  Negative for depression, hallucinations and suicidal ideas. The patient is not nervous/anxious.   Blood pressure (!) 107/56, pulse 93, temperature 98 F (36.7 C), temperature source Oral, resp. rate 20, height '5\' 6"'$  (1.676 m), weight 93.4 kg, SpO2 99 %. Body mass index is 33.25 kg/m.   Social History   Tobacco Use  Smoking Status Former   Packs/day: 0.00   Types: Cigarettes  Smokeless Tobacco Never  Tobacco Comments   Smoked for 2  years age [age]   Tobacco Cessation:  A prescription for an FDA-approved tobacco cessation medication was offered at discharge and the patient refused   Blood Alcohol level:  Lab Results  Component Value Date   [facility] <10 07/01/2021   ETH <10 63/78/5885    Metabolic Disorder Labs:  Lab Results   Component Value Date   HGBA1C 5.1 03/02/2021   MPG 99.67 03/02/2021   MPG 96.8 07/02/2019   Lab Results  Component Value Date   PROLACTIN 66.2 (H) 03/02/2021   PROLACTIN 66.1 (H) 02/04/2020   Lab Results  Component Value Date   CHOL 245 (H) 05/01/2021   TRIG 169 (H) 05/01/2021   HDL 46 05/01/2021   CHOLHDL 5.3 05/01/2021   VLDL 34 05/01/2021   LDLCALC 165 (H) 05/01/2021   LDLCALC 102 (H) 04/24/2021    See Psychiatric Specialty Exam and Suicide Risk Assessment completed by Attending Physician prior to discharge.  Discharge  destination:  Home  Is patient on multiple antipsychotic therapies at discharge:  Yes,  was started on Seroquel to provide mood stabilization while awaiting clearance of Vraylar OD before restarting Vraylar. Do you recommend tapering to monotherapy for antipsychotics?  Yes   Has Patient had three or more failed trials of antipsychotic monotherapy by history:  No  Recommended Plan for Multiple Antipsychotic Therapies: Taper to monotherapy as described:  As she becomes stable on Vraylar again outpatient provider can consider stopping Seroquel  Discharge Instructions     Diet - low sodium heart healthy   Complete by: As directed    Increase activity slowly   Complete by: As directed       Allergies as of 07/11/2021       Reactions   Bee Venom Anaphylaxis   Coconut Flavor Anaphylaxis, Rash   Geodon [ziprasidone Hcl] Other (See Comments)   Pt states that this medication causes paralysis of the mouth.     Haloperidol And Related Other (See Comments)   Pt states that this medication causes paralysis of the mouth, jaw locks up   Lithium Other (See Comments)   Seizure-like activity   Oxycodone Other (See Comments)   Hallucinations    Quetiapine Other (See Comments)   Pt states that this medication is too strong.    Shellfish Allergy Anaphylaxis   Phenergan [promethazine Hcl] Other (See Comments)   Chest pain    Prilosec [omeprazole] Nausea And  Vomiting   Sulfa Antibiotics Other (See Comments)   Chest pain    Tegretol [carbamazepine] Nausea And Vomiting   Prozac [fluoxetine] Other (See Comments)   Increased Depression , Suicidal thoughts   Tape Other (See Comments)   Skin tears, can only tolerate paper tape.   Tylenol [acetaminophen]    Unspecified reaction        Medication List     STOP taking these medications    ibuprofen 200 MG tablet Commonly known as: ADVIL       TAKE these medications      Indication  albuterol 108 (90 Base) MCG/ACT inhaler Commonly known as: VENTOLIN HFA Inhale 2 puffs into the lungs every 6 (six) hours as needed for wheezing or shortness of breath.  Indication: Asthma   budesonide-formoterol 160-4.5 MCG/ACT inhaler Commonly known as: SYMBICORT Inhale 2 puffs into the lungs 2 (two) times daily.  Indication: Asthma   calcium carbonate 500 MG chewable tablet Commonly known as: TUMS - dosed in mg elemental calcium Chew 1 tablet by mouth daily as needed for indigestion or heartburn.  Indication: Acid Indigestion   cariprazine 3 MG capsule Commonly known as: VRAYLAR Take 1 capsule (3 mg total) by mouth daily. Start taking on: July 12, 2021  Indication: MIXED BIPOLAR AFFECTIVE DISORDER   diclofenac Sodium 1 % Gel Commonly known as: Voltaren Apply 2 g topically 4 (four) times daily.  Indication: Joint Damage causing Pain and Loss of Function   escitalopram 10 MG tablet Commonly known as: LEXAPRO Take 1 tablet (10 mg total) by mouth daily.  Indication: Major Depressive Disorder   gabapentin 300 MG capsule Commonly known as: NEURONTIN Take 1 capsule (300 mg total) by mouth 3 (three) times daily for 7 days.  Indication: Social Anxiety Disorder   hydrOXYzine 10 MG tablet Commonly known as: ATARAX Take 1 tablet (10 mg total) by mouth 3 (three) times daily as needed for anxiety.  Indication: Feeling Anxious   melatonin 3 MG Tabs tablet Take 3 mg by mouth at bedtime.  Indication: Trouble Sleeping   pantoprazole 40 MG tablet Commonly known as: PROTONIX TAKE 1 TABLET BY MOUTH DAILY What changed: how to take this  Indication: Heartburn   propranolol 10 MG tablet Commonly known as: INDERAL Take 1 tablet (10 mg total) by mouth 2 (two) times daily.  Indication: Rapid Heart Rate Disorder   QUEtiapine 50 MG tablet Commonly known as: SEROQUEL Take 1 tablet (50 mg total) by mouth at bedtime for 7 days.  Indication: Manic-Depression   traZODone 50 MG tablet Commonly known as: DESYREL Take 1 tablet (50 mg total) by mouth at bedtime as needed for sleep.  Indication: Trouble Sleeping        Follow-up Information     [facility] Follow up on 07/14/2021.   Why: You have a hospital follow up appointment for therapy and medication management services on 07/14/21 at 10:30 am.   This appointment will be Virtual telehealth.  Please continue with this provider's [facility] in [place], [place]. Contact information: [place] [address] [place], [place] on 07/12/2021.   Why: You have an appointment with your primary care provider on  07/12/21 at 8:15 am.  This appointment will be held in person. Contact information: [place] [address] [phone]                 Follow-up recommendations/Comments:    Activity: as tolerated   Diet: heart healthy   Other: -Follow-up with your outpatient psychiatric provider -instructions on appointment date, time, and address (location) are provided to you in discharge paperwork.   -Take your psychiatric medications as prescribed at discharge - instructions are provided to you in the discharge paperwork   -Follow-up with outpatient primary care doctor and other specialists -for management of chronic medical disease, including: You will need routine monitoring of your- weight, A1c, Lipid Panel, CMP, CBC, and EKG because of your  antipsychotic.     -Testing: Follow-up with outpatient provider for abnormal lab results: None   -Recommend abstinence from alcohol, tobacco, and other illicit drug use at discharge.    -If your psychiatric symptoms recur, worsen, or if you have side effects to your psychiatric medications, call your outpatient psychiatric provider, 911, [phone] or go to the nearest emergency department.   -If suicidal thoughts recur, call your outpatient psychiatric provider, 911, [phone] or go to the nearest emergency department.  Signed: Briant Cedar, MD 07/11/2021, 5:26 PM

## 2021-07-11 NOTE — Progress Notes (Signed)
Pt discharged to lobby. Pt was stable and appreciative at that time. All papers and prescriptions were given and valuables returned. Verbal understanding expressed. Denies SI/HI and A/VH. Pt given opportunity to express concerns and ask questions.  

## 2021-07-12 ENCOUNTER — Ambulatory Visit (HOSPITAL_COMMUNITY)
Admission: EM | Admit: 2021-07-12 | Discharge: 2021-07-14 | Disposition: A | Discharge status: Home / Self Care | Payer: PPO | Attending: Nurse Practitioner | Admitting: Nurse Practitioner

## 2021-07-12 DIAGNOSIS — Z9151 Personal history of suicidal behavior: Secondary | ICD-10-CM | POA: Diagnosis not present

## 2021-07-12 DIAGNOSIS — Z818 Family history of other mental and behavioral disorders: Secondary | ICD-10-CM | POA: Insufficient documentation

## 2021-07-12 DIAGNOSIS — F419 Anxiety disorder, unspecified: Secondary | ICD-10-CM | POA: Insufficient documentation

## 2021-07-12 DIAGNOSIS — F79 Unspecified intellectual disabilities: Secondary | ICD-10-CM | POA: Insufficient documentation

## 2021-07-12 DIAGNOSIS — F603 Borderline personality disorder: Secondary | ICD-10-CM | POA: Insufficient documentation

## 2021-07-12 DIAGNOSIS — F840 Autistic disorder: Secondary | ICD-10-CM | POA: Insufficient documentation

## 2021-07-12 DIAGNOSIS — Z79899 Other long term (current) drug therapy: Secondary | ICD-10-CM | POA: Diagnosis not present

## 2021-07-12 DIAGNOSIS — F312 Bipolar disorder, current episode manic severe with psychotic features: Secondary | ICD-10-CM | POA: Diagnosis not present

## 2021-07-12 DIAGNOSIS — Z20822 Contact with and (suspected) exposure to covid-19: Secondary | ICD-10-CM | POA: Diagnosis not present

## 2021-07-12 DIAGNOSIS — R45851 Suicidal ideations: Secondary | ICD-10-CM | POA: Diagnosis not present

## 2021-07-12 LAB — POCT URINE DRUG SCREEN - MANUAL ENTRY (I-SCREEN)
POC Amphetamine UR: NOT DETECTED
POC Buprenorphine (BUP): NOT DETECTED
POC Cocaine UR: NOT DETECTED
POC Marijuana UR: NOT DETECTED
POC Methadone UR: NOT DETECTED
POC Methamphetamine UR: NOT DETECTED
POC Morphine: NOT DETECTED
POC Oxazepam (BZO): POSITIVE — AB
POC Oxycodone UR: NOT DETECTED
POC Secobarbital (BAR): NOT DETECTED

## 2021-07-12 LAB — POC SARS CORONAVIRUS 2 AG: SARSCOV2ONAVIRUS 2 AG: NEGATIVE

## 2021-07-12 LAB — RESP PANEL BY RT-PCR (FLU A&B, COVID) ARPGX2
Influenza A by PCR: NEGATIVE
Influenza B by PCR: NEGATIVE
SARS Coronavirus 2 by RT PCR: NEGATIVE

## 2021-07-12 LAB — POCT PREGNANCY, URINE: Preg Test, Ur: NEGATIVE

## 2021-07-12 MED ORDER — FLUTICASONE FUROATE-VILANTEROL 200-25 MCG/ACT IN AEPB
1.0000 | INHALATION_SPRAY | Freq: Every day | RESPIRATORY_TRACT | Status: DC
  Administered 2021-07-13: 1 via RESPIRATORY_TRACT
  Filled 2021-07-12: qty 28

## 2021-07-12 MED ORDER — ALUM & MAG HYDROXIDE-SIMETH 200-200-20 MG/5ML PO SUSP: 30.0000 mL | ORAL | Status: DC | PRN

## 2021-07-12 MED ORDER — LORAZEPAM 1 MG PO TABS
1.0000 mg | ORAL_TABLET | Freq: Once | ORAL | Status: AC | PRN
  Administered 2021-07-13: 1 mg via ORAL
  Filled 2021-07-12: qty 1

## 2021-07-12 MED ORDER — PROPRANOLOL HCL 10 MG PO TABS
10.0000 mg | ORAL_TABLET | Freq: Two times a day (BID) | ORAL | Status: DC
  Administered 2021-07-12 – 2021-07-13 (×2): 10 mg via ORAL
  Filled 2021-07-12 (×4): qty 1

## 2021-07-12 MED ORDER — ENSURE ENLIVE PO LIQD
237.0000 mL | Freq: Two times a day (BID) | ORAL | Status: DC
  Administered 2021-07-13 (×2): 237 mL via ORAL
  Filled 2021-07-12: qty 237

## 2021-07-12 MED ORDER — CARIPRAZINE HCL 3 MG PO CAPS
3.0000 mg | ORAL_CAPSULE | Freq: Every day | ORAL | Status: DC
  Administered 2021-07-13: 3 mg via ORAL
  Filled 2021-07-12 (×2): qty 1

## 2021-07-12 MED ORDER — IBUPROFEN 800 MG PO TABS
800.0000 mg | ORAL_TABLET | Freq: Once | ORAL | Status: AC
  Administered 2021-07-12: 800 mg via ORAL
  Filled 2021-07-12: qty 1

## 2021-07-12 MED ORDER — MELATONIN 3 MG PO TABS
3.0000 mg | ORAL_TABLET | Freq: Every day | ORAL | Status: DC
  Administered 2021-07-12 – 2021-07-13 (×2): 3 mg via ORAL
  Filled 2021-07-12 (×2): qty 1

## 2021-07-12 MED ORDER — PANTOPRAZOLE SODIUM 40 MG PO TBEC
40.0000 mg | DELAYED_RELEASE_TABLET | Freq: Every day | ORAL | Status: DC
  Administered 2021-07-12 – 2021-07-13 (×2): 40 mg via ORAL
  Filled 2021-07-12 (×3): qty 1

## 2021-07-12 MED ORDER — GABAPENTIN 300 MG PO CAPS
300.0000 mg | ORAL_CAPSULE | Freq: Three times a day (TID) | ORAL | Status: DC
  Administered 2021-07-12 – 2021-07-13 (×4): 300 mg via ORAL
  Filled 2021-07-12 (×5): qty 1

## 2021-07-12 MED ORDER — QUETIAPINE FUMARATE 50 MG PO TABS
50.0000 mg | ORAL_TABLET | Freq: Every day | ORAL | Status: DC
  Administered 2021-07-12 – 2021-07-13 (×2): 50 mg via ORAL
  Filled 2021-07-12 (×2): qty 1

## 2021-07-12 MED ORDER — ESCITALOPRAM OXALATE 10 MG PO TABS
10.0000 mg | ORAL_TABLET | Freq: Every day | ORAL | Status: DC
  Administered 2021-07-12 – 2021-07-13 (×2): 10 mg via ORAL
  Filled 2021-07-12 (×3): qty 1

## 2021-07-12 MED ORDER — MAGNESIUM HYDROXIDE 400 MG/5ML PO SUSP: 30.0000 mL | Freq: Every day | ORAL | Status: DC | PRN
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
## 2021-07-12 NOTE — Progress Notes (Signed)
Patient was brought to the [facility] by the police.  Patient was just released from [facility] yesterday and states that she has continued to hear voices that are telling her to smash her head or kill herself.  Patient was hitting her head on the walls of her home, in the patrol car and on the walls of the [place].  She states that it is the only way to get them to stop.  Patient states, "If I had a bunch of pills, I would take them so I could die."  Patient states that she was admitted to [facility] due to an overdose.  She states that "[facility]" providers her outpatient services.  Patient denies HI and SA issues.  Patient is urgent.

## 2021-07-12 NOTE — ED Provider Notes (Signed)
[facility] Continuous Assessment Admission H&P  Date: 07/12/21 Patient Name: [name] MRN: [id number] Chief Complaint:  Chief Complaint  Patient presents with   Suicidal   Auditory Hallucinations      Diagnoses:  Final diagnoses:  Bipolar affective disorder, currently manic, severe, with psychotic features ([place])  Borderline personality disorder ([place])    HPI: [name] is a 31 y.o. female with a history of bipolar disorder, intellectual disability, autism, depression, anxiety, and borderline personality disorder who presents voluntarily to [facility] with law enforcement due to [place] with plan to overdose and AVH. Patietn is well known to behavioral health services. Patient was inpatient at [facility] from 07/04/21-07/11/2021 after an overdose on propranolol, vraylar, gabapentin, lexapro, and protonix. Patient states that when she was discharged she went home and paid her bills. States that she started hearing voices again that tell her to hit her head and to kill herself. Reports VH of "lucifer." Patient does not appear to be responding to internal stimuli. Patient reports SI with plan to overdose. She denies HI. States that she attempted to contact her [employer] with [place], but no one responded. Denies use of alcohol, marijuana, and other substances.   On evaluation patient is alert and oriented x 4. She is neatly groomed. Eye contact is fair. Speech is clear and coherent. Mood is depressed and affect is congruent with mood. Thought process is coherent and thought content is logical. Reports AH of voices hat tell her to hit her head and to kill herself. Reports VH of "lucifer." No indication that patient is responding to internal stimuli. No evidence of delusional thought content. Reports SI with plan to overdose. Denies homicidal ideations. Denies substance abuse.     Past psychiatric history: Diagnosis: Reported "intellectual disability, autism, bipolar disorder,  depression, anxiety, borderline personality disorder" Patient reports history of multiple psychiatric hospitalizations Patient reports history of multiple suicide attempts Past psychiatric medication history: " I have taken so many I cannot recall.  But I do recall that Tegretol made blood come out of my mouth".    Past medical history: Asthma, arthritis Denies history of seizures Surgical history: Oral surgery Allergies: Multiple, see medical record  Social history: Born in [place], raised in [place], lives in [place].  Widowed.  1 child that is 80 years old.  Stopped working last week.  Family history: Psychiatric family history: Father: Depression.  Brother: Autism.  Patient reports father and 2 uncles attempted suicide.    PHQ 2-9:  [place] ED from 06/26/2021 in [place] ED from 05/01/2021 in [facility] ED from 02/03/2020 in [facility]  Thoughts that you would be better off dead, or of hurting yourself in some way Several days Nearly every day Nearly every day  [Phreesia 02/03/2020]  PHQ-9 Total Score '9 17 27       '$ Flowsheet Row Admission (Discharged) from 07/04/2021 in [facility] ED to Hosp-Admission (Discharged) from 07/01/2021 in [facility] ED from 06/26/2021 in [facility] No Risk High Risk High Risk        Total Time spent with patient: 30 minutes  Musculoskeletal  Strength & Muscle Tone: within normal limits Gait & Station: normal Patient leans: N/A  Psychiatric Specialty Exam  Presentation General Appearance: Appropriate for Environment; Neat  Eye Contact:Fair  Speech:Clear and Coherent; Normal Rate  Speech  Volume:Normal  Handedness:Right   Mood and Affect  Mood:Depressed; Anxious  Affect:Congruent   Thought Process  Thought  Processes:Linear  Descriptions of Associations:Intact  Orientation:Full (Time, Place and Person)  Thought Content:Logical  Diagnosis of Schizophrenia or Schizoaffective disorder in past: No  Duration of Psychotic Symptoms: Greater than six months  Hallucinations:Hallucinations: Auditory; Visual Description of Auditory Hallucinations: voices tell her to kill herself Description of Visual Hallucinations: VH of "luicpher"  Ideas of Reference:None  Suicidal Thoughts:Suicidal Thoughts: Yes, Active SI Active Intent and/or Plan: With Intent; With Plan; With Means to [place]  Homicidal Thoughts:Homicidal Thoughts: No   Sensorium  Memory:Immediate Good; Recent Good; Remote Good  Judgment:Impaired  Insight:Present   Executive Functions  Concentration:Fair  Attention Span:Fair  [place]  Language:Good   Psychomotor Activity  Psychomotor Activity:Psychomotor Activity: Normal   Assets  Assets:Communication Skills; Financial Resources/Insurance; Housing; Physical Health   Sleep  Sleep:Sleep: Good Number of Hours of Sleep: 8.5   Nutritional Assessment (For OBS and FBC admissions only) Has the patient had a weight loss or gain of 10 pounds or more in the last 3 months?: No Has the patient had a decrease in food intake/or appetite?: No Does the patient have dental problems?: No Does the patient have eating habits or behaviors that may be indicators of an eating disorder including binging or inducing vomiting?: No Has the patient recently lost weight without trying?: 0 Has the patient been eating poorly because of a decreased appetite?: 0 Malnutrition Screening Tool Score: 0    Physical Exam Constitutional:      General: She is not in acute distress.    Appearance: She is not ill-appearing, toxic-appearing or diaphoretic.  HENT:     Head: Normocephalic.     Right Ear: External ear normal.     Left Ear: External ear normal.  Eyes:      Conjunctiva/sclera: Conjunctivae normal.     Pupils: Pupils are equal, round, and reactive to light.  Cardiovascular:     Rate and Rhythm: Normal rate.  Pulmonary:     Effort: Pulmonary effort is normal. No respiratory distress.  Musculoskeletal:        General: Normal range of motion.  Skin:    General: Skin is warm and dry.  Neurological:     Mental Status: She is alert and oriented to person, place, and time.  Psychiatric:        Mood and Affect: Mood is anxious and depressed.        Thought Content: Thought content is not paranoid or delusional. Thought content includes suicidal ideation. Thought content does not include homicidal ideation. Thought content includes suicidal plan.   Review of Systems  Constitutional:  Negative for chills, diaphoresis, fever, malaise/fatigue and weight loss.  HENT:  Negative for congestion.   Respiratory:  Negative for cough and shortness of breath.   Cardiovascular:  Negative for chest pain and palpitations.  Gastrointestinal:  Negative for diarrhea, nausea and vomiting.  Neurological:  Negative for dizziness and seizures.  Psychiatric/Behavioral:  Positive for depression, hallucinations and suicidal ideas. Negative for memory loss and substance abuse. The patient is nervous/anxious and has insomnia.   All other systems reviewed and are negative.  Blood pressure 110/77, pulse 92, temperature 98.5 F (36.9 C), temperature source Oral, resp. rate 18, SpO2 99 %. There is no height or weight on file to calculate BMI.    Is the patient at risk to self? Yes  Has the patient been a  risk to self in the past 6 months? Yes .    Has the patient been a risk to self within the distant past? Yes   Is the patient a risk to others? No   Has the patient been a risk to others in the past 6 months? No   Has the patient been a risk to others within the distant past? No   Past Medical History:  Past Medical History:  Diagnosis Date   Acid reflux    Anxiety     Asthma    last attack 03/13/15 or 03/14/15   Autism    Carrier of fragile X syndrome    Chronic constipation    Depression    Drug-seeking behavior    Essential tremor    Headache    Overdose of acetaminophen 07/2017   and other meds   Personality disorder ([place])    Schizo-affective psychosis ([place])    Schizoaffective disorder, bipolar type ([place])    Seizures ([place])    Last seizure December 2017   Sleep apnea     Past Surgical History:  Procedure Laterality Date   MOUTH SURGERY  2009 or 2010    Family History:  Family History  Problem Relation Age of Onset   Mental illness Father    Asthma Father    PDD Brother    Seizures Brother     Social History:  Social History   Socioeconomic History   Marital status: Widowed    Spouse name: Not on file   Number of children: 0   Years of education: Not on file   Highest education level: Not on file  Occupational History   Occupation: disability  Tobacco Use   Smoking status: Former    Packs/day: 0.00    Types: Cigarettes   Smokeless tobacco: Never   Tobacco comments:    Smoked for 2  years age [age]  Vaping Use   Vaping Use: Never used  Substance and Sexual Activity   Alcohol use: No    Alcohol/week: 1.0 standard drink    Types: 1 Standard drinks or equivalent per week    Comment: denies at this time   Drug use: No    Comment: History of cocaine use at age 81 for 4 months   Sexual activity: Not Currently    Birth control/protection: None  Other Topics Concern   Not on file  Social History Narrative   Marital status: Widowed      Children: daughter      Lives: with boyfriend, in two story home      Employment:  Disability      Tobacco: quit smoking; smoked for two years.      Alcohol ;none      Drugs: none   Has not traveled outside of the country.   Right handed         Social Determinants of Health   Financial Resource Strain: Not on file  Food Insecurity: Not on file  Transportation Needs: Not on file   Physical Activity: Not on file  Stress: Not on file  Social Connections: Not on file  Intimate Partner Violence: Not on file    SDOH:  SDOH Screenings   Alcohol Screen: Low Risk    Last Alcohol Screening Score (AUDIT): 0  Depression (PHQ2-9): Medium Risk   PHQ-2 Score: 9  Financial Resource Strain: Not on file  Food Insecurity: Not on file  Housing: Not on file  Physical Activity: Not on file  Social Connections: Not on file  Stress: Not on file  Tobacco Use: Medium Risk   Smoking Tobacco Use: Former   Smokeless Tobacco Use: Never   Passive Exposure: Not on file  Transportation Needs: Not on file    Last Labs:  No results displayed because visit has over 200 results.    Admission on 06/26/2021, Discharged on 06/27/2021  Component Date Value Ref Range Status   Glucose-Capillary 06/26/2021 100 (H)  70 - 99 mg/dL Final   Glucose reference range applies only to samples taken after fasting for at least 8 hours.   Sodium 06/26/2021 138  135 - 145 mmol/L Final   Potassium 06/26/2021 4.1  3.5 - 5.1 mmol/L Final   Chloride 06/26/2021 109  98 - 111 mmol/L Final   CO2 06/26/2021 22  22 - 32 mmol/L Final   Glucose, Bld 06/26/2021 111 (H)  70 - 99 mg/dL Final   Glucose reference range applies only to samples taken after fasting for at least 8 hours.   BUN 06/26/2021 6  6 - 20 mg/dL Final   Creatinine, Ser 06/26/2021 0.74  0.44 - 1.00 mg/dL Final   Calcium 06/26/2021 9.6  8.9 - 10.3 mg/dL Final   Total Protein 06/26/2021 7.1  6.5 - 8.1 g/dL Final   Albumin 06/26/2021 4.1  3.5 - 5.0 g/dL Final   AST 06/26/2021 21  15 - 41 U/L Final   ALT 06/26/2021 20  0 - 44 U/L Final   Alkaline Phosphatase 06/26/2021 60  38 - 126 U/L Final   Total Bilirubin 06/26/2021 0.6  0.3 - 1.2 mg/dL Final   GFR, Estimated 06/26/2021 >60  >60 mL/min Final   Comment: (NOTE) Calculated using the CKD-EPI Creatinine Equation (2021)    Anion gap 06/26/2021 7  5 - 15 Final   Performed at [facility] [address] [place], [place] [address]   Salicylate Lvl 16/96/7893 <7.0 (L)  7.0 - 30.0 mg/dL Final   Performed at [facility] [address], [place], [place] [address]   Acetaminophen (Tylenol), Serum 06/26/2021 <10 (L)  10 - 30 ug/mL Final   Comment: (NOTE) Therapeutic concentrations vary significantly. A range of 10-30 ug/mL  may be an effective concentration for many patients. However, some  are best treated at concentrations outside of this range. Acetaminophen concentrations >150 ug/mL at 4 hours after ingestion  and >50 ug/mL at 12 hours after ingestion are often associated with  toxic reactions.  Performed at [facility], [place] [address] [place], [place] [address]    Alcohol, Ethyl (B) 06/26/2021 <10  <10 mg/dL Final   Comment: (NOTE) Lowest detectable limit for serum alcohol is 10 mg/dL.  For medical purposes only. Performed at [facility], [place] [address] [place], [place] [address]    Opiates 06/26/2021 NONE DETECTED  NONE DETECTED Final   Cocaine 06/26/2021 NONE DETECTED  NONE DETECTED Final   Benzodiazepines 06/26/2021 NONE DETECTED  NONE DETECTED Final   Amphetamines 06/26/2021 NONE DETECTED  NONE DETECTED Final   Tetrahydrocannabinol 06/26/2021 NONE DETECTED  NONE DETECTED Final   Barbiturates 06/26/2021 NONE DETECTED  NONE DETECTED Final   Comment: (NOTE) DRUG SCREEN FOR MEDICAL PURPOSES ONLY.  IF CONFIRMATION IS NEEDED FOR ANY PURPOSE, NOTIFY LAB WITHIN 5 DAYS.  LOWEST DETECTABLE LIMITS FOR URINE DRUG SCREEN Drug Class                     Cutoff (ng/mL)  Amphetamine and metabolites    1000 Barbiturate and metabolites    200 Benzodiazepine                 818 Tricyclics and metabolites     300 Opiates and metabolites        300 Cocaine and metabolites        300 THC                            50 Performed at [facility], [place] [address] [place], [place] [address]    WBC 06/26/2021 9.4  4.0 - 10.5 K/uL Final   RBC  06/26/2021 4.89  3.87 - 5.11 MIL/uL Final   Hemoglobin 06/26/2021 12.9  12.0 - 15.0 g/dL Final   HCT 06/26/2021 40.1  36.0 - 46.0 % Final   MCV 06/26/2021 82.0  80.0 - 100.0 fL Final   MCH 06/26/2021 26.4  26.0 - 34.0 pg Final   MCHC 06/26/2021 32.2  30.0 - 36.0 g/dL Final   RDW 06/26/2021 15.5  11.5 - 15.5 % Final   Platelets 06/26/2021 298  150 - 400 K/uL Final   nRBC 06/26/2021 0.0  0.0 - 0.2 % Final   Neutrophils Relative % 06/26/2021 62  % Final   Neutro Abs 06/26/2021 5.8  1.7 - 7.7 K/uL Final   Lymphocytes Relative 06/26/2021 32  % Final   Lymphs Abs 06/26/2021 3.0  0.7 - 4.0 K/uL Final   Monocytes Relative 06/26/2021 5  % Final   Monocytes Absolute 06/26/2021 0.4  0.1 - 1.0 K/uL Final   Eosinophils Relative 06/26/2021 1  % Final   Eosinophils Absolute 06/26/2021 0.1  0.0 - 0.5 K/uL Final   Basophils Relative 06/26/2021 0  % Final   Basophils Absolute 06/26/2021 0.0  0.0 - 0.1 K/uL Final   Immature Granulocytes 06/26/2021 0  % Final   Abs Immature Granulocytes 06/26/2021 0.03  0.00 - 0.07 K/uL Final   Performed at [facility], [place] [address] [place], [place] [address]   I-stat hCG, quantitative 06/26/2021 <5.0  <5 mIU/mL Final   Comment 3 06/26/2021          Final   Comment:   GEST. AGE      CONC.  (mIU/mL)   <=1 WEEK        5 - 50     2 WEEKS       50 - 500     3 WEEKS       100 - 10,000     4 WEEKS     1,000 - 30,000        FEMALE AND NON-PREGNANT FEMALE:     LESS THAN 5 mIU/mL    Magnesium 06/26/2021 1.8  1.7 - 2.4 mg/dL Final   Performed at [facility], [place] [address] [place], [place] [address]   Acetaminophen (Tylenol), Serum 06/26/2021 <10 (L)  10 - 30 ug/mL Final   Comment: (NOTE) Therapeutic concentrations vary significantly. A range of 10-30 ug/mL  may be an effective concentration for many patients. However, some  are best treated at concentrations outside of this range. Acetaminophen concentrations >150 ug/mL at 4 hours after ingestion  and >50  ug/mL at 12 hours after ingestion are often associated with  toxic reactions.  Performed at [facility], [place] [address] [place], [place] [address]    WBC 06/26/2021 6.3  4.0 -  10.5 K/uL Final   RBC 06/26/2021 3.92  3.87 - 5.11 MIL/uL Final   Hemoglobin 06/26/2021 10.3 (L)  12.0 - 15.0 g/dL Final   HCT 06/26/2021 32.4 (L)  36.0 - 46.0 % Final   MCV 06/26/2021 82.7  80.0 - 100.0 fL Final   MCH 06/26/2021 26.3  26.0 - 34.0 pg Final   MCHC 06/26/2021 31.8  30.0 - 36.0 g/dL Final   RDW 06/26/2021 15.5  11.5 - 15.5 % Final   Platelets 06/26/2021 201  150 - 400 K/uL Final   nRBC 06/26/2021 0.0  0.0 - 0.2 % Final   Neutrophils Relative % 06/26/2021 56  % Final   Neutro Abs 06/26/2021 3.5  1.7 - 7.7 K/uL Final   Lymphocytes Relative 06/26/2021 38  % Final   Lymphs Abs 06/26/2021 2.4  0.7 - 4.0 K/uL Final   Monocytes Relative 06/26/2021 5  % Final   Monocytes Absolute 06/26/2021 0.3  0.1 - 1.0 K/uL Final   Eosinophils Relative 06/26/2021 1  % Final   Eosinophils Absolute 06/26/2021 0.1  0.0 - 0.5 K/uL Final   Basophils Relative 06/26/2021 0  % Final   Basophils Absolute 06/26/2021 0.0  0.0 - 0.1 K/uL Final   Immature Granulocytes 06/26/2021 0  % Final   Abs Immature Granulocytes 06/26/2021 0.01  0.00 - 0.07 K/uL Final   Performed at [facility], [place] [address] [place], [place] [address]   Sodium 06/26/2021 138  135 - 145 mmol/L Final   Potassium 06/26/2021 3.7  3.5 - 5.1 mmol/L Final   Chloride 06/26/2021 114 (H)  98 - 111 mmol/L Final   CO2 06/26/2021 22  22 - 32 mmol/L Final   Glucose, Bld 06/26/2021 99  70 - 99 mg/dL Final   Glucose reference range applies only to samples taken after fasting for at least 8 hours.   BUN 06/26/2021 5 (L)  6 - 20 mg/dL Final   Creatinine, Ser 06/26/2021 0.62  0.44 - 1.00 mg/dL Final   Calcium 06/26/2021 8.0 (L)  8.9 - 10.3 mg/dL Final   Total Protein 06/26/2021 5.6 (L)  6.5 - 8.1 g/dL Final   Albumin 06/26/2021 3.1 (L)  3.5 - 5.0 g/dL  Final   AST 06/26/2021 15  15 - 41 U/L Final   ALT 06/26/2021 15  0 - 44 U/L Final   Alkaline Phosphatase 06/26/2021 46  38 - 126 U/L Final   Total Bilirubin 06/26/2021 0.5  0.3 - 1.2 mg/dL Final   GFR, Estimated 06/26/2021 >60  >60 mL/min Final   Comment: (NOTE) Calculated using the CKD-EPI Creatinine Equation (2021)    Anion gap 06/26/2021 2 (L)  5 - 15 Final   Comment: Electrolytes repeated to confirm. Performed at [facility], [place] [address], [place], [place] [address]   Admission on 06/23/2021, Discharged on 06/24/2021  Component Date Value Ref Range Status   SARS Coronavirus 2 by RT PCR 06/24/2021 NEGATIVE  NEGATIVE Final   Comment: (NOTE) SARS-CoV-2 target nucleic acids are NOT DETECTED.  The SARS-CoV-2 RNA is generally detectable in upper respiratory specimens during the acute phase of infection. The lowest concentration of SARS-CoV-2 viral copies this assay can detect is 138 copies/mL. A negative result does not preclude SARS-Cov-2 infection and should not be used as the sole basis for treatment or other patient management decisions. A negative result may occur with  improper specimen collection/handling, submission of specimen other than nasopharyngeal swab, presence of viral mutation(s) within the  areas targeted by this assay, and inadequate number of viral copies(<138 copies/mL). A negative result must be combined with clinical observations, patient history, and epidemiological information. The expected result is Negative.  Fact Sheet for Patients:  EntrepreneurPulse.com.au  Fact Sheet for Healthcare Providers:  IncredibleEmployment.be  This test is no                          t yet approved or cleared by the [place] FDA and  has been authorized for detection and/or diagnosis of SARS-CoV-2 by FDA under an Emergency Use Authorization (EUA). This EUA will remain  in effect (meaning this test can be used) for the  duration of the COVID-19 declaration under Section 564(b)(1) of the Act, 21 U.S.C.section 360bbb-3(b)(1), unless the authorization is terminated  or revoked sooner.       Influenza A by PCR 06/24/2021 NEGATIVE  NEGATIVE Final   Influenza B by PCR 06/24/2021 NEGATIVE  NEGATIVE Final   Comment: (NOTE) The Xpert Xpress SARS-CoV-2/FLU/RSV plus assay is intended as an aid in the diagnosis of influenza from Nasopharyngeal swab specimens and should not be used as a sole basis for treatment. Nasal washings and aspirates are unacceptable for Xpert Xpress SARS-CoV-2/FLU/RSV testing.  Fact Sheet for Patients: EntrepreneurPulse.com.au  Fact Sheet for Healthcare Providers: IncredibleEmployment.be  This test is not yet approved or cleared by the [place] FDA and has been authorized for detection and/or diagnosis of SARS-CoV-2 by FDA under an Emergency Use Authorization (EUA). This EUA will remain in effect (meaning this test can be used) for the duration of the COVID-19 declaration under Section 564(b)(1) of the Act, 21 U.S.C. section 360bbb-3(b)(1), unless the authorization is terminated or revoked.  Performed at [name], [place] [address], [place], [place] [address]    POC Amphetamine UR 06/24/2021 None Detected  NONE DETECTED (Cut Off Level 1000 ng/mL) Final   POC Secobarbital (BAR) 06/24/2021 None Detected  NONE DETECTED (Cut Off Level 300 ng/mL) Final   POC Buprenorphine (BUP) 06/24/2021 None Detected  NONE DETECTED (Cut Off Level 10 ng/mL) Final   POC Oxazepam (BZO) 06/24/2021 Positive (A)  NONE DETECTED (Cut Off Level 300 ng/mL) Final   POC Cocaine UR 06/24/2021 None Detected  NONE DETECTED (Cut Off Level 300 ng/mL) Final   POC Methamphetamine UR 06/24/2021 None Detected  NONE DETECTED (Cut Off Level 1000 ng/mL) Final   POC Morphine 06/24/2021 None Detected  NONE DETECTED (Cut Off Level 300 ng/mL) Final   POC Methadone UR 06/24/2021  None Detected  NONE DETECTED (Cut Off Level 300 ng/mL) Final   POC Oxycodone UR 06/24/2021 None Detected  NONE DETECTED (Cut Off Level 100 ng/mL) Final   POC Marijuana UR 06/24/2021 None Detected  NONE DETECTED (Cut Off Level 50 ng/mL) Final   SARSCOV2ONAVIRUS 2 AG 06/24/2021 NEGATIVE  NEGATIVE Final   Comment: (NOTE) SARS-CoV-2 antigen NOT DETECTED.   Negative results are presumptive.  Negative results do not preclude SARS-CoV-2 infection and should not be used as the sole basis for treatment or other patient management decisions, including infection  control decisions, particularly in the presence of clinical signs and  symptoms consistent with COVID-19, or in those who have been in contact with the virus.  Negative results must be combined with clinical observations, patient history, and epidemiological information. The expected result is Negative.  Fact Sheet for Patients: HandmadeRecipes.com.cy  Fact Sheet for Healthcare Providers: FuneralLife.at  This test is not yet approved or cleared  by the [place] and  has been authorized for detection and/or diagnosis of SARS-CoV-2 by FDA under an Emergency Use Authorization (EUA).  This EUA will remain in effect (meaning this test can be used) for the duration of  the COV                          ID-19 declaration under Section 564(b)(1) of the Act, 21 U.S.C. section 360bbb-3(b)(1), unless the authorization is terminated or revoked sooner.     Preg Test, Ur 06/24/2021 NEGATIVE  NEGATIVE Final   Comment:        THE SENSITIVITY OF THIS METHODOLOGY IS >24 mIU/mL   Admission on 06/23/2021, Discharged on 06/23/2021  Component Date Value Ref Range Status   Preg Test, Ur 06/23/2021 NEGATIVE  NEGATIVE Final   Comment:        THE SENSITIVITY OF THIS METHODOLOGY IS >20 mIU/mL. Performed at [facility], [place] [address] [place], [place] [address]   Admission on  06/04/2021, Discharged on 06/04/2021  Component Date Value Ref Range Status   Sodium 06/04/2021 138  135 - 145 mmol/L Final   Potassium 06/04/2021 3.4 (L)  3.5 - 5.1 mmol/L Final   Chloride 06/04/2021 108  98 - 111 mmol/L Final   CO2 06/04/2021 23  22 - 32 mmol/L Final   Glucose, Bld 06/04/2021 94  70 - 99 mg/dL Final   Glucose reference range applies only to samples taken after fasting for at least 8 hours.   BUN 06/04/2021 7  6 - 20 mg/dL Final   Creatinine, Ser 06/04/2021 0.73  0.44 - 1.00 mg/dL Final   Calcium 06/04/2021 9.1  8.9 - 10.3 mg/dL Final   Total Protein 06/04/2021 7.2  6.5 - 8.1 g/dL Final   Albumin 06/04/2021 4.0  3.5 - 5.0 g/dL Final   AST 06/04/2021 17  15 - 41 U/L Final   ALT 06/04/2021 14  0 - 44 U/L Final   Alkaline Phosphatase 06/04/2021 71  38 - 126 U/L Final   Total Bilirubin 06/04/2021 0.4  0.3 - 1.2 mg/dL Final   GFR, Estimated 06/04/2021 >60  >60 mL/min Final   Comment: (NOTE) Calculated using the CKD-EPI Creatinine Equation (2021)    Anion gap 06/04/2021 7  5 - 15 Final   Performed at [facility] [address], [place], [place] [address]   Salicylate Lvl 29/56/2130 <7.0 (L)  7.0 - 30.0 mg/dL Final   Performed at [facility] [address] [place], [place] [address]   Acetaminophen (Tylenol), Serum 06/04/2021 <10 (L)  10 - 30 ug/mL Final   Comment: (NOTE) Therapeutic concentrations vary significantly. A range of 10-30 ug/mL  may be an effective concentration for many patients. However, some  are best treated at concentrations outside of this range. Acetaminophen concentrations >150 ug/mL at 4 hours after ingestion  and >50 ug/mL at 12 hours after ingestion are often associated with  toxic reactions.  Performed at [facility], [place] [address] [place], [place] [address]    Alcohol, Ethyl (B) 06/04/2021 <10  <10 mg/dL Final   Comment: (NOTE) Lowest detectable limit for serum alcohol is 10 mg/dL.  For medical purposes  only. Performed at [facility], [place] [address] [place], [place] [address]    Opiates 06/04/2021 NONE DETECTED  NONE DETECTED Final   Cocaine 06/04/2021 NONE DETECTED  NONE DETECTED Final   Benzodiazepines 06/04/2021  NONE DETECTED  NONE DETECTED Final   Amphetamines 06/04/2021 NONE DETECTED  NONE DETECTED Final   Tetrahydrocannabinol 06/04/2021 NONE DETECTED  NONE DETECTED Final   Barbiturates 06/04/2021 NONE DETECTED  NONE DETECTED Final   Comment: (NOTE) DRUG SCREEN FOR MEDICAL PURPOSES ONLY.  IF CONFIRMATION IS NEEDED FOR ANY PURPOSE, NOTIFY LAB WITHIN 5 DAYS.  LOWEST DETECTABLE LIMITS FOR URINE DRUG SCREEN Drug Class                     Cutoff (ng/mL) Amphetamine and metabolites    1000 Barbiturate and metabolites    200 Benzodiazepine                 209 Tricyclics and metabolites     300 Opiates and metabolites        300 Cocaine and metabolites        300 THC                            50 Performed at [facility], [place] [address] [place], [place] [address]    WBC 06/04/2021 8.2  4.0 - 10.5 K/uL Final   RBC 06/04/2021 4.73  3.87 - 5.11 MIL/uL Final   Hemoglobin 06/04/2021 12.2  12.0 - 15.0 g/dL Final   HCT 06/04/2021 38.2  36.0 - 46.0 % Final   MCV 06/04/2021 80.8  80.0 - 100.0 fL Final   MCH 06/04/2021 25.8 (L)  26.0 - 34.0 pg Final   MCHC 06/04/2021 31.9  30.0 - 36.0 g/dL Final   RDW 06/04/2021 13.9  11.5 - 15.5 % Final   Platelets 06/04/2021 344  150 - 400 K/uL Final   nRBC 06/04/2021 0.0  0.0 - 0.2 % Final   Neutrophils Relative % 06/04/2021 53  % Final   Neutro Abs 06/04/2021 4.3  1.7 - 7.7 K/uL Final   Lymphocytes Relative 06/04/2021 38  % Final   Lymphs Abs 06/04/2021 3.1  0.7 - 4.0 K/uL Final   Monocytes Relative 06/04/2021 7  % Final   Monocytes Absolute 06/04/2021 0.6  0.1 - 1.0 K/uL Final   Eosinophils Relative 06/04/2021 1  % Final   Eosinophils Absolute 06/04/2021 0.1  0.0 - 0.5 K/uL Final   Basophils Relative 06/04/2021 1  % Final    Basophils Absolute 06/04/2021 0.1  0.0 - 0.1 K/uL Final   Immature Granulocytes 06/04/2021 0  % Final   Abs Immature Granulocytes 06/04/2021 0.03  0.00 - 0.07 K/uL Final   Performed at [facility], [place] [address] [place], [place] [address]   I-stat hCG, quantitative 06/04/2021 <5.0  <5 mIU/mL Final   Comment 3 06/04/2021          Final   Comment:   GEST. AGE      CONC.  (mIU/mL)   <=1 WEEK        5 - 50     2 WEEKS       50 - 500     3 WEEKS       100 - 10,000     4 WEEKS     1,000 - 30,000        FEMALE AND NON-PREGNANT FEMALE:     LESS THAN 5 mIU/mL    SARS Coronavirus 2 by RT PCR 06/04/2021 NEGATIVE  NEGATIVE Final   Comment: (NOTE) SARS-CoV-2 target nucleic acids are NOT DETECTED.  The SARS-CoV-2 RNA is generally detectable  in upper respiratory specimens during the acute phase of infection. The lowest concentration of SARS-CoV-2 viral copies this assay can detect is 138 copies/mL. A negative result does not preclude SARS-Cov-2 infection and should not be used as the sole basis for treatment or other patient management decisions. A negative result may occur with  improper specimen collection/handling, submission of specimen other than nasopharyngeal swab, presence of viral mutation(s) within the areas targeted by this assay, and inadequate number of viral copies(<138 copies/mL). A negative result must be combined with clinical observations, patient history, and epidemiological information. The expected result is Negative.  Fact Sheet for Patients:  EntrepreneurPulse.com.au  Fact Sheet for Healthcare Providers:  IncredibleEmployment.be  This test is no                          t yet approved or cleared by the [place] FDA and  has been authorized for detection and/or diagnosis of SARS-CoV-2 by FDA under an Emergency Use Authorization (EUA). This EUA will remain  in effect (meaning this test can be used) for the duration of  the COVID-19 declaration under Section 564(b)(1) of the Act, 21 U.S.C.section 360bbb-3(b)(1), unless the authorization is terminated  or revoked sooner.       Influenza A by PCR 06/04/2021 NEGATIVE  NEGATIVE Final   Influenza B by PCR 06/04/2021 NEGATIVE  NEGATIVE Final   Comment: (NOTE) The Xpert Xpress SARS-CoV-2/FLU/RSV plus assay is intended as an aid in the diagnosis of influenza from Nasopharyngeal swab specimens and should not be used as a sole basis for treatment. Nasal washings and aspirates are unacceptable for Xpert Xpress SARS-CoV-2/FLU/RSV testing.  Fact Sheet for Patients: EntrepreneurPulse.com.au  Fact Sheet for Healthcare Providers: IncredibleEmployment.be  This test is not yet approved or cleared by the [place] FDA and has been authorized for detection and/or diagnosis of SARS-CoV-2 by FDA under an Emergency Use Authorization (EUA). This EUA will remain in effect (meaning this test can be used) for the duration of the COVID-19 declaration under Section 564(b)(1) of the Act, 21 U.S.C. section 360bbb-3(b)(1), unless the authorization is terminated or revoked.  Performed at [facility], [place] [address], [place], [place] [address]   Admission on 05/29/2021, Discharged on 05/30/2021  Component Date Value Ref Range Status   SARS Coronavirus 2 by RT PCR 05/29/2021 NEGATIVE  NEGATIVE Final   Comment: (NOTE) SARS-CoV-2 target nucleic acids are NOT DETECTED.  The SARS-CoV-2 RNA is generally detectable in upper respiratory specimens during the acute phase of infection. The lowest concentration of SARS-CoV-2 viral copies this assay can detect is 138 copies/mL. A negative result does not preclude SARS-Cov-2 infection and should not be used as the sole basis for treatment or other patient management decisions. A negative result may occur with  improper specimen collection/handling, submission of specimen other than  nasopharyngeal swab, presence of viral mutation(s) within the areas targeted by this assay, and inadequate number of viral copies(<138 copies/mL). A negative result must be combined with clinical observations, patient history, and epidemiological information. The expected result is Negative.  Fact Sheet for Patients:  EntrepreneurPulse.com.au  Fact Sheet for Healthcare Providers:  IncredibleEmployment.be  This test is no                          t yet approved or cleared by the [place] FDA and  has been authorized for detection and/or diagnosis of SARS-CoV-2 by FDA under  an Emergency Use Authorization (EUA). This EUA will remain  in effect (meaning this test can be used) for the duration of the COVID-19 declaration under Section 564(b)(1) of the Act, 21 U.S.C.section 360bbb-3(b)(1), unless the authorization is terminated  or revoked sooner.       Influenza A by PCR 05/29/2021 NEGATIVE  NEGATIVE Final   Influenza B by PCR 05/29/2021 NEGATIVE  NEGATIVE Final   Comment: (NOTE) The Xpert Xpress SARS-CoV-2/FLU/RSV plus assay is intended as an aid in the diagnosis of influenza from Nasopharyngeal swab specimens and should not be used as a sole basis for treatment. Nasal washings and aspirates are unacceptable for Xpert Xpress SARS-CoV-2/FLU/RSV testing.  Fact Sheet for Patients: EntrepreneurPulse.com.au  Fact Sheet for Healthcare Providers: IncredibleEmployment.be  This test is not yet approved or cleared by the [place] FDA and has been authorized for detection and/or diagnosis of SARS-CoV-2 by FDA under an Emergency Use Authorization (EUA). This EUA will remain in effect (meaning this test can be used) for the duration of the COVID-19 declaration under Section 564(b)(1) of the Act, 21 U.S.C. section 360bbb-3(b)(1), unless the authorization is terminated or revoked.  Performed at [facility], [place] [address] [place], [place] [address]    Color, Urine 05/29/2021 STRAW (A)  YELLOW Final   APPearance 05/29/2021 CLEAR  CLEAR Final   Specific Gravity, Urine 05/29/2021 1.010  1.005 - 1.030 Final   pH 05/29/2021 6.0  5.0 - 8.0 Final   Glucose, UA 05/29/2021 NEGATIVE  NEGATIVE mg/dL Final   Hgb urine dipstick 05/29/2021 NEGATIVE  NEGATIVE Final   Bilirubin Urine 05/29/2021 NEGATIVE  NEGATIVE Final   Ketones, ur 05/29/2021 NEGATIVE  NEGATIVE mg/dL Final   Protein, ur 05/29/2021 NEGATIVE  NEGATIVE mg/dL Final   Nitrite 05/29/2021 NEGATIVE  NEGATIVE Final   Leukocytes,Ua 05/29/2021 NEGATIVE  NEGATIVE Final   RBC / HPF 05/29/2021 0-5  0 - 5 RBC/hpf Final   WBC, UA 05/29/2021 0-5  0 - 5 WBC/hpf Final   Bacteria, UA 05/29/2021 NONE SEEN  NONE SEEN Final   Squamous Epithelial / LPF 05/29/2021 0-5  0 - 5 Final   Mucus 05/29/2021 PRESENT   Final   Performed at [facility], [place] [address], [place], [place] [address]   WBC 05/29/2021 8.7  4.0 - 10.5 K/uL Final   RBC 05/29/2021 4.56  3.87 - 5.11 MIL/uL Final   Hemoglobin 05/29/2021 12.1  12.0 - 15.0 g/dL Final   HCT 05/29/2021 37.8  36.0 - 46.0 % Final   MCV 05/29/2021 82.9  80.0 - 100.0 fL Final   MCH 05/29/2021 26.5  26.0 - 34.0 pg Final   MCHC 05/29/2021 32.0  30.0 - 36.0 g/dL Final   RDW 05/29/2021 14.6  11.5 - 15.5 % Final   Platelets 05/29/2021 205  150 - 400 K/uL Final   nRBC 05/29/2021 0.0  0.0 - 0.2 % Final   Performed at [facility] [address] [place], [place] [address]   Sodium 05/29/2021 138  135 - 145 mmol/L Final   Potassium 05/29/2021 3.7  3.5 - 5.1 mmol/L Final   Chloride 05/29/2021 108  98 - 111 mmol/L Final   CO2 05/29/2021 22  22 - 32 mmol/L Final   Glucose, Bld 05/29/2021 121 (H)  70 - 99 mg/dL Final   Glucose reference range applies only to samples taken after fasting for at least 8 hours.   BUN 05/29/2021 8  6 - 20 mg/dL Final  Creatinine, Ser 05/29/2021 0.66  0.44 - 1.00 mg/dL Final    Calcium 05/29/2021 9.3  8.9 - 10.3 mg/dL Final   GFR, Estimated 05/29/2021 >60  >60 mL/min Final   Comment: (NOTE) Calculated using the CKD-EPI Creatinine Equation (2021)    Anion gap 05/29/2021 8  5 - 15 Final   Performed at [facility] [address], [place], [place] [address]   I-stat hCG, quantitative 05/30/2021 <5.0  <5 mIU/mL Final   Comment 3 05/30/2021          Final   Comment:   GEST. AGE      CONC.  (mIU/mL)   <=1 WEEK        5 - 50     2 WEEKS       50 - 500     3 WEEKS       100 - 10,000     4 WEEKS     1,000 - 30,000        FEMALE AND NON-PREGNANT FEMALE:     LESS THAN 5 mIU/mL   Admission on 05/01/2021, Discharged on 05/01/2021  Component Date Value Ref Range Status   SARS Coronavirus 2 by RT PCR 05/01/2021 NEGATIVE  NEGATIVE Final   Comment: (NOTE) SARS-CoV-2 target nucleic acids are NOT DETECTED.  The SARS-CoV-2 RNA is generally detectable in upper respiratory specimens during the acute phase of infection. The lowest concentration of SARS-CoV-2 viral copies this assay can detect is 138 copies/mL. A negative result does not preclude SARS-Cov-2 infection and should not be used as the sole basis for treatment or other patient management decisions. A negative result may occur with  improper specimen collection/handling, submission of specimen other than nasopharyngeal swab, presence of viral mutation(s) within the areas targeted by this assay, and inadequate number of viral copies(<138 copies/mL). A negative result must be combined with clinical observations, patient history, and epidemiological information. The expected result is Negative.  Fact Sheet for Patients:  EntrepreneurPulse.com.au  Fact Sheet for Healthcare Providers:  IncredibleEmployment.be  This test is no                          t yet approved or cleared by the [place] FDA and  has been authorized for detection and/or diagnosis of SARS-CoV-2  by FDA under an Emergency Use Authorization (EUA). This EUA will remain  in effect (meaning this test can be used) for the duration of the COVID-19 declaration under Section 564(b)(1) of the Act, 21 U.S.C.section 360bbb-3(b)(1), unless the authorization is terminated  or revoked sooner.       Influenza A by PCR 05/01/2021 NEGATIVE  NEGATIVE Final   Influenza B by PCR 05/01/2021 NEGATIVE  NEGATIVE Final   Comment: (NOTE) The Xpert Xpress SARS-CoV-2/FLU/RSV plus assay is intended as an aid in the diagnosis of influenza from Nasopharyngeal swab specimens and should not be used as a sole basis for treatment. Nasal washings and aspirates are unacceptable for Xpert Xpress SARS-CoV-2/FLU/RSV testing.  Fact Sheet for Patients: EntrepreneurPulse.com.au  Fact Sheet for Healthcare Providers: IncredibleEmployment.be  This test is not yet approved or cleared by the [place] FDA and has been authorized for detection and/or diagnosis of SARS-CoV-2 by FDA under an Emergency Use Authorization (EUA). This EUA will remain in effect (meaning this test can be used) for the duration of the COVID-19 declaration under Section 564(b)(1) of the Act, 21 U.S.C. section 360bbb-3(b)(1), unless the authorization is terminated or  revoked.  Performed at [facility], [place] [address], [place], [place] [address]    WBC 05/01/2021 12.7 (H)  4.0 - 10.5 K/uL Final   RBC 05/01/2021 5.28 (H)  3.87 - 5.11 MIL/uL Final   Hemoglobin 05/01/2021 13.9  12.0 - 15.0 g/dL Final   HCT 05/01/2021 41.9  36.0 - 46.0 % Final   MCV 05/01/2021 79.4 (L)  80.0 - 100.0 fL Final   MCH 05/01/2021 26.3  26.0 - 34.0 pg Final   MCHC 05/01/2021 33.2  30.0 - 36.0 g/dL Final   RDW 05/01/2021 13.4  11.5 - 15.5 % Final   Platelets 05/01/2021 351  150 - 400 K/uL Final   nRBC 05/01/2021 0.0  0.0 - 0.2 % Final   Neutrophils Relative % 05/01/2021 66  % Final   Neutro Abs 05/01/2021 8.4 (H)  1.7 -  7.7 K/uL Final   Lymphocytes Relative 05/01/2021 27  % Final   Lymphs Abs 05/01/2021 3.4  0.7 - 4.0 K/uL Final   Monocytes Relative 05/01/2021 5  % Final   Monocytes Absolute 05/01/2021 0.7  0.1 - 1.0 K/uL Final   Eosinophils Relative 05/01/2021 1  % Final   Eosinophils Absolute 05/01/2021 0.1  0.0 - 0.5 K/uL Final   Basophils Relative 05/01/2021 0  % Final   Basophils Absolute 05/01/2021 0.1  0.0 - 0.1 K/uL Final   Immature Granulocytes 05/01/2021 1  % Final   Abs Immature Granulocytes 05/01/2021 0.07  0.00 - 0.07 K/uL Final   Performed at [facility], [place] [address], [place], [place] [address]   Sodium 05/01/2021 137  135 - 145 mmol/L Final   Potassium 05/01/2021 3.7  3.5 - 5.1 mmol/L Final   Chloride 05/01/2021 107  98 - 111 mmol/L Final   CO2 05/01/2021 21 (L)  22 - 32 mmol/L Final   Glucose, Bld 05/01/2021 96  70 - 99 mg/dL Final   Glucose reference range applies only to samples taken after fasting for at least 8 hours.   BUN 05/01/2021 9  6 - 20 mg/dL Final   Creatinine, Ser 05/01/2021 0.73  0.44 - 1.00 mg/dL Final   Calcium 05/01/2021 9.5  8.9 - 10.3 mg/dL Final   Total Protein 05/01/2021 7.8  6.5 - 8.1 g/dL Final   Albumin 05/01/2021 4.4  3.5 - 5.0 g/dL Final   AST 05/01/2021 18  15 - 41 U/L Final   ALT 05/01/2021 24  0 - 44 U/L Final   Alkaline Phosphatase 05/01/2021 70  38 - 126 U/L Final   Total Bilirubin 05/01/2021 0.6  0.3 - 1.2 mg/dL Final   GFR, Estimated 05/01/2021 >60  >60 mL/min Final   Comment: (NOTE) Calculated using the CKD-EPI Creatinine Equation (2021)    Anion gap 05/01/2021 9  5 - 15 Final   Performed at [facility] [name], [place], [place] [address]   Alcohol, Ethyl (B) 05/01/2021 <10  <10 mg/dL Final   Comment: (NOTE) Lowest detectable limit for serum alcohol is 10 mg/dL.  For medical purposes only. Performed at [facility], [place] [address], [place],  [address]    Cholesterol 05/01/2021 245 (H)  0 - 200 mg/dL  Final   Triglycerides 05/01/2021 169 (H)  <150 mg/dL Final   HDL 05/01/2021 46  >40 mg/dL Final   Total CHOL/HDL Ratio 05/01/2021 5.3  RATIO Final   VLDL 05/01/2021 34  0 - 40 mg/dL Final   LDL Cholesterol 05/01/2021  165 (H)  0 - 99 mg/dL Final   Comment:        Total Cholesterol/HDL:CHD Risk Coronary Heart Disease Risk Table                     Men   Women  1/2 Average Risk   3.4   3.3  Average Risk       5.0   4.4  2 X Average Risk   9.6   7.1  3 X Average Risk  23.4   11.0        Use the calculated Patient Ratio above and the CHD Risk Table to determine the patient's CHD Risk.        ATP III CLASSIFICATION (LDL):  <100     mg/dL   Optimal  100-129  mg/dL   Near or Above                    Optimal  130-159  mg/dL   Borderline  160-189  mg/dL   High  >190     mg/dL   Very High Performed at [facility] [address] [place], [place] [address]    TSH 05/01/2021 2.572  0.350 - 4.500 uIU/mL Final   Comment: Performed by a 3rd Generation assay with a functional sensitivity of <=0.01 uIU/mL. Performed at [facility], [place] [address] [place], [place] [address]    SARS Coronavirus 2 Ag 05/01/2021 Negative (NE)  Negative Final   SARSCOV2ONAVIRUS 2 AG 05/01/2021 NEGATIVE  NEGATIVE Final   Comment: (NOTE) SARS-CoV-2 antigen NOT DETECTED.   Negative results are presumptive.  Negative results do not preclude SARS-CoV-2 infection and should not be used as the sole basis for treatment or other patient management decisions, including infection  control decisions, particularly in the presence of clinical signs and  symptoms consistent with COVID-19, or in those who have been in contact with the virus.  Negative results must be combined with clinical observations, patient history, and epidemiological information. The expected result is Negative.  Fact Sheet for Patients: HandmadeRecipes.com.cy  Fact Sheet for Healthcare  Providers: FuneralLife.at  This test is not yet approved or cleared by the [place] FDA and  has been authorized for detection and/or diagnosis of SARS-CoV-2 by FDA under an Emergency Use Authorization (EUA).  This EUA will remain in effect (meaning this test can be used) for the duration of  the COV                          ID-19 declaration under Section 564(b)(1) of the Act, 21 U.S.C. section 360bbb-3(b)(1), unless the authorization is terminated or revoked sooner.    Admission on 04/21/2021, Discharged on 04/26/2021  Component Date Value Ref Range Status   Color, Urine 04/22/2021 YELLOW  YELLOW Final   APPearance 04/22/2021 HAZY (A)  CLEAR Final   Specific Gravity, Urine 04/22/2021 1.016  1.005 - 1.030 Final   pH 04/22/2021 7.0  5.0 - 8.0 Final   Glucose, UA 04/22/2021 NEGATIVE  NEGATIVE mg/dL Final   Hgb urine dipstick 04/22/2021 NEGATIVE  NEGATIVE Final   Bilirubin Urine 04/22/2021 NEGATIVE  NEGATIVE Final   Ketones, ur 04/22/2021 NEGATIVE  NEGATIVE mg/dL Final   Protein, ur 04/22/2021 NEGATIVE  NEGATIVE mg/dL Final   Nitrite 04/22/2021 NEGATIVE  NEGATIVE Final   Leukocytes,Ua 04/22/2021 NEGATIVE  NEGATIVE Final   Performed at [facility]  [address] [name], [place] [address]   Cholesterol 04/24/2021 190  0 - 200 mg/dL Final   Triglycerides 04/24/2021 220 (H)  <150 mg/dL Final   HDL 04/24/2021 44  >40 mg/dL Final   Total CHOL/HDL Ratio 04/24/2021 4.3  RATIO Final   VLDL 04/24/2021 44 (H)  0 - 40 mg/dL Final   LDL Cholesterol 04/24/2021 102 (H)  0 - 99 mg/dL Final   Comment:        Total Cholesterol/HDL:CHD Risk Coronary Heart Disease Risk Table                     Men   Women  1/2 Average Risk   3.4   3.3  Average Risk       5.0   4.4  2 X Average Risk   9.6   7.1  3 X Average Risk  23.4   11.0        Use the calculated Patient Ratio above and the CHD Risk Table to determine the patient's CHD Risk.         ATP III CLASSIFICATION (LDL):  <100     mg/dL   Optimal  100-129  mg/dL   Near or Above                    Optimal  130-159  mg/dL   Borderline  160-189  mg/dL   High  >190     mg/dL   Very High Performed at [facility] [address], [place], [place] [address]   Admission on 04/20/2021, Discharged on 04/21/2021  Component Date Value Ref Range Status   SARS Coronavirus 2 by RT PCR 04/21/2021 NEGATIVE  NEGATIVE Final   Comment: (NOTE) SARS-CoV-2 target nucleic acids are NOT DETECTED.  The SARS-CoV-2 RNA is generally detectable in upper respiratory specimens during the acute phase of infection. The lowest concentration of SARS-CoV-2 viral copies this assay can detect is 138 copies/mL. A negative result does not preclude SARS-Cov-2 infection and should not be used as the sole basis for treatment or other patient management decisions. A negative result may occur with  improper specimen collection/handling, submission of specimen other than nasopharyngeal swab, presence of viral mutation(s) within the areas targeted by this assay, and inadequate number of viral copies(<138 copies/mL). A negative result must be combined with clinical observations, patient history, and epidemiological information. The expected result is Negative.  Fact Sheet for Patients:  EntrepreneurPulse.com.au  Fact Sheet for Healthcare Providers:  IncredibleEmployment.be  This test is no                          t yet approved or cleared by the [place] FDA and  has been authorized for detection and/or diagnosis of SARS-CoV-2 by FDA under an Emergency Use Authorization (EUA). This EUA will remain  in effect (meaning this test can be used) for the duration of the COVID-19 declaration under Section 564(b)(1) of the Act, 21 U.S.C.section 360bbb-3(b)(1), unless the authorization is terminated  or revoked sooner.       Influenza A by PCR 04/21/2021  NEGATIVE  NEGATIVE Final   Influenza B by PCR 04/21/2021 NEGATIVE  NEGATIVE Final   Comment: (NOTE) The Xpert Xpress SARS-CoV-2/FLU/RSV plus assay is intended as an aid in the diagnosis of influenza from Nasopharyngeal swab specimens and should not be used as a sole basis for treatment. Nasal washings and aspirates are unacceptable for Xpert Xpress SARS-CoV-2/FLU/RSV  testing.  Fact Sheet for Patients: EntrepreneurPulse.com.au  Fact Sheet for Healthcare Providers: IncredibleEmployment.be  This test is not yet approved or cleared by the [place] FDA and has been authorized for detection and/or diagnosis of SARS-CoV-2 by FDA under an Emergency Use Authorization (EUA). This EUA will remain in effect (meaning this test can be used) for the duration of the COVID-19 declaration under Section 564(b)(1) of the Act, 21 U.S.C. section 360bbb-3(b)(1), unless the authorization is terminated or revoked.  Performed at [facility], [place] [address] [place], [place] [address]    WBC 04/21/2021 9.0  4.0 - 10.5 K/uL Final   RBC 04/21/2021 4.63  3.87 - 5.11 MIL/uL Final   Hemoglobin 04/21/2021 12.0  12.0 - 15.0 g/dL Final   HCT 04/21/2021 37.2  36.0 - 46.0 % Final   MCV 04/21/2021 80.3  80.0 - 100.0 fL Final   MCH 04/21/2021 25.9 (L)  26.0 - 34.0 pg Final   MCHC 04/21/2021 32.3  30.0 - 36.0 g/dL Final   RDW 04/21/2021 13.4  11.5 - 15.5 % Final   Platelets 04/21/2021 275  150 - 400 K/uL Final   nRBC 04/21/2021 0.0  0.0 - 0.2 % Final   Neutrophils Relative % 04/21/2021 64  % Final   Neutro Abs 04/21/2021 5.6  1.7 - 7.7 K/uL Final   Lymphocytes Relative 04/21/2021 30  % Final   Lymphs Abs 04/21/2021 2.7  0.7 - 4.0 K/uL Final   Monocytes Relative 04/21/2021 6  % Final   Monocytes Absolute 04/21/2021 0.6  0.1 - 1.0 K/uL Final   Eosinophils Relative 04/21/2021 0  % Final   Eosinophils Absolute 04/21/2021 0.0  0.0 - 0.5 K/uL Final   Basophils Relative  04/21/2021 0  % Final   Basophils Absolute 04/21/2021 0.0  0.0 - 0.1 K/uL Final   Immature Granulocytes 04/21/2021 0  % Final   Abs Immature Granulocytes 04/21/2021 0.03  0.00 - 0.07 K/uL Final   Performed at [facility], [place] [address] [place], [place] [address]   Sodium 04/21/2021 137  135 - 145 mmol/L Final   Potassium 04/21/2021 3.8  3.5 - 5.1 mmol/L Final   Chloride 04/21/2021 105  98 - 111 mmol/L Final   CO2 04/21/2021 23  22 - 32 mmol/L Final   Glucose, Bld 04/21/2021 97  70 - 99 mg/dL Final   Glucose reference range applies only to samples taken after fasting for at least 8 hours.   BUN 04/21/2021 11  6 - 20 mg/dL Final   Creatinine, Ser 04/21/2021 0.72  0.44 - 1.00 mg/dL Final   Calcium 04/21/2021 9.3  8.9 - 10.3 mg/dL Final   Total Protein 04/21/2021 7.2  6.5 - 8.1 g/dL Final   Albumin 04/21/2021 4.1  3.5 - 5.0 g/dL Final   AST 04/21/2021 16  15 - 41 U/L Final   ALT 04/21/2021 15  0 - 44 U/L Final   Alkaline Phosphatase 04/21/2021 68  38 - 126 U/L Final   Total Bilirubin 04/21/2021 0.2 (L)  0.3 - 1.2 mg/dL Final   GFR, Estimated 04/21/2021 >60  >60 mL/min Final   Comment: (NOTE) Calculated using the CKD-EPI Creatinine Equation (2021)    Anion gap 04/21/2021 9  5 - 15 Final   Performed at [facility] [address], [place], [place] [address]   POC Amphetamine UR 04/21/2021 None Detected  NONE DETECTED (Cut Off Level 1000 ng/mL) Final   POC Secobarbital (BAR) 04/21/2021  None Detected  NONE DETECTED (Cut Off Level 300 ng/mL) Final   POC Buprenorphine (BUP) 04/21/2021 None Detected  NONE DETECTED (Cut Off Level 10 ng/mL) Final   POC Oxazepam (BZO) 04/21/2021 None Detected  NONE DETECTED (Cut Off Level 300 ng/mL) Final   POC Cocaine UR 04/21/2021 None Detected  NONE DETECTED (Cut Off Level 300 ng/mL) Final   POC Methamphetamine UR 04/21/2021 None Detected  NONE DETECTED (Cut Off Level 1000 ng/mL) Final   POC Morphine 04/21/2021 None Detected  NONE DETECTED (Cut  Off Level 300 ng/mL) Final   POC Oxycodone UR 04/21/2021 None Detected  NONE DETECTED (Cut Off Level 100 ng/mL) Final   POC Methadone UR 04/21/2021 None Detected  NONE DETECTED (Cut Off Level 300 ng/mL) Final   POC Marijuana UR 04/21/2021 None Detected  NONE DETECTED (Cut Off Level 50 ng/mL) Final   SARS Coronavirus 2 Ag 04/21/2021 Negative  Negative Preliminary   Preg Test, Ur 04/21/2021 NEGATIVE  NEGATIVE Final   Comment:        THE SENSITIVITY OF THIS METHODOLOGY IS >24 mIU/mL    SARSCOV2ONAVIRUS 2 AG 04/21/2021 NEGATIVE  NEGATIVE Final   Comment: (NOTE) SARS-CoV-2 antigen NOT DETECTED.   Negative results are presumptive.  Negative results do not preclude SARS-CoV-2 infection and should not be used as the sole basis for treatment or other patient management decisions, including infection  control decisions, particularly in the presence of clinical signs and  symptoms consistent with COVID-19, or in those who have been in contact with the virus.  Negative results must be combined with clinical observations, patient history, and epidemiological information. The expected result is Negative.  Fact Sheet for Patients: HandmadeRecipes.com.cy  Fact Sheet for Healthcare Providers: FuneralLife.at  This test is not yet approved or cleared by the [place] FDA and  has been authorized for detection and/or diagnosis of SARS-CoV-2 by FDA under an Emergency Use Authorization (EUA).  This EUA will remain in effect (meaning this test can be used) for the duration of  the COV                          ID-19 declaration under Section 564(b)(1) of the Act, 21 U.S.C. section 360bbb-3(b)(1), unless the authorization is terminated or revoked sooner.    Admission on 03/11/2021, Discharged on 03/12/2021  Component Date Value Ref Range Status   Sodium 03/11/2021 139  135 - 145 mmol/L Final   Potassium 03/11/2021 3.5  3.5 - 5.1 mmol/L Final    Chloride 03/11/2021 109  98 - 111 mmol/L Final   CO2 03/11/2021 23  22 - 32 mmol/L Final   Glucose, Bld 03/11/2021 113 (H)  70 - 99 mg/dL Final   Glucose reference range applies only to samples taken after fasting for at least 8 hours.   BUN 03/11/2021 8  6 - 20 mg/dL Final   Creatinine, Ser 03/11/2021 0.68  0.44 - 1.00 mg/dL Final   Calcium 03/11/2021 8.9  8.9 - 10.3 mg/dL Final   GFR, Estimated 03/11/2021 >60  >60 mL/min Final   Comment: (NOTE) Calculated using the CKD-EPI Creatinine Equation (2021)    Anion gap 03/11/2021 7  5 - 15 Final   Performed at [facility], [place] [address], [place], [place] [address]   WBC 03/11/2021 7.9  4.0 - 10.5 K/uL Final   RBC 03/11/2021 4.72  3.87 - 5.11 MIL/uL Final   Hemoglobin 03/11/2021 12.3  12.0 - 15.0 g/dL Final  HCT 03/11/2021 39.1  36.0 - 46.0 % Final   MCV 03/11/2021 82.8  80.0 - 100.0 fL Final   MCH 03/11/2021 26.1  26.0 - 34.0 pg Final   MCHC 03/11/2021 31.5  30.0 - 36.0 g/dL Final   RDW 03/11/2021 13.6  11.5 - 15.5 % Final   Platelets 03/11/2021 250  150 - 400 K/uL Final   nRBC 03/11/2021 0.0  0.0 - 0.2 % Final   Neutrophils Relative % 03/11/2021 61  % Final   Neutro Abs 03/11/2021 4.9  1.7 - 7.7 K/uL Final   Lymphocytes Relative 03/11/2021 31  % Final   Lymphs Abs 03/11/2021 2.4  0.7 - 4.0 K/uL Final   Monocytes Relative 03/11/2021 6  % Final   Monocytes Absolute 03/11/2021 0.5  0.1 - 1.0 K/uL Final   Eosinophils Relative 03/11/2021 1  % Final   Eosinophils Absolute 03/11/2021 0.1  0.0 - 0.5 K/uL Final   Basophils Relative 03/11/2021 1  % Final   Basophils Absolute 03/11/2021 0.0  0.0 - 0.1 K/uL Final   Immature Granulocytes 03/11/2021 0  % Final   Abs Immature Granulocytes 03/11/2021 0.03  0.00 - 0.07 K/uL Final   Performed at [facility], [place] [address], [place], [place] [address]  There may be more visits with results that are not included.    Allergies: Bee venom, Coconut flavor, Geodon [ziprasidone hcl],  Haloperidol and related, Lithium, Oxycodone, Quetiapine, Shellfish allergy, Phenergan [promethazine hcl], Prilosec [omeprazole], Sulfa antibiotics, Tegretol [carbamazepine], Prozac [fluoxetine], Tape, and Tylenol [acetaminophen]  PTA Medications:  Prior to Admission medications   Medication Sig Start Date End Date Taking? Authorizing Provider  albuterol (VENTOLIN HFA) 108 (90 Base) MCG/ACT inhaler Inhale 2 puffs into the lungs every 6 (six) hours as needed for wheezing or shortness of breath.    [provider]  budesonide-formoterol (SYMBICORT) 160-4.5 MCG/ACT inhaler Inhale 2 puffs into the lungs 2 (two) times daily.    [provider]  calcium carbonate (TUMS - DOSED IN MG ELEMENTAL CALCIUM) 500 MG chewable tablet Chew 1 tablet by mouth daily as needed for indigestion or heartburn.    [provider]  cariprazine (VRAYLAR) 3 MG capsule Take 1 capsule (3 mg total) by mouth daily. 07/12/21   Massengill, Ovid Curd, MD  diclofenac Sodium (VOLTAREN) 1 % GEL Apply 2 g topically 4 (four) times daily. 06/23/21   Harris, Abigail, PA-C  escitalopram (LEXAPRO) 10 MG tablet Take 1 tablet (10 mg total) by mouth daily. 07/11/21   Massengill, Ovid Curd, MD  gabapentin (NEURONTIN) 300 MG capsule Take 1 capsule (300 mg total) by mouth 3 (three) times daily for 7 days. 07/11/21 07/18/21  Massengill, Ovid Curd, MD  hydrOXYzine (ATARAX) 10 MG tablet Take 1 tablet (10 mg total) by mouth 3 (three) times daily as needed for anxiety. 07/11/21   Massengill, Ovid Curd, MD  melatonin 3 MG TABS tablet Take 3 mg by mouth at bedtime.    [provider]  pantoprazole (PROTONIX) 40 MG tablet TAKE 1 TABLET BY MOUTH DAILY Patient taking differently: 40 mg daily. 04/26/21   Jeanie Sewer, NP  predniSONE (DELTASONE) 20 MG tablet Take 20 mg by mouth 2 (two) times daily. 06/08/21   [provider]  propranolol (INDERAL) 10 MG tablet Take 1 tablet (10 mg total) by mouth 2 (two) times daily. 07/11/21    Massengill, Ovid Curd, MD  QUEtiapine (SEROQUEL) 50 MG tablet Take 1 tablet (50 mg total) by mouth at bedtime for 7 days. 07/11/21 07/18/21  Massengill, Ovid Curd, MD  sulfamethoxazole-trimethoprim (BACTRIM DS) 800-160 MG tablet Take 1 tablet by mouth 2 (two) times daily. 06/16/21   [provider]  traZODone (DESYREL) 50 MG tablet Take 1 tablet (50 mg total) by mouth at bedtime as needed for sleep. 07/11/21   Janine Limbo, MD      Medical Decision Making  Admit to continuous assessment for crisis stabilization  Continue home medications  Scheduled Meds:  cariprazine  3 mg Oral Daily   escitalopram  10 mg Oral Daily   feeding supplement  237 mL Oral BID BM   fluticasone furoate-vilanterol  1 puff Inhalation Daily   gabapentin  300 mg Oral TID   melatonin  3 mg Oral QHS   pantoprazole  40 mg Oral Daily   propranolol  10 mg Oral BID   QUEtiapine  50 mg Oral QHS   Lab Orders         Resp Panel by RT-PCR (Flu A&B, Covid) Anterior Nasal Swab         CBC with Differential/Platelet         Comprehensive metabolic panel         Pregnancy, urine         POCT Urine Drug Screen - (I-Screen)         POC SARS Coronavirus 2 Ag-ED - Nasal Swab         Pregnancy, urine POC         POC SARS Coronavirus 2 Ag          Recommendations  Based on my evaluation the patient does not appear to have an emergency medical condition.    Rozetta Nunnery, NP 07/12/21  8:47 PM

## 2021-07-12 NOTE — ED Notes (Signed)
Patient admitted to [facility] endorsing SI, HI, AH. Patient denies any ,substance abuse. Patient was cooperative during the admission assessment. Skin assessment complete. Belongings inventoried. Patient oriented to unit and unit rules. Meal and drinks offered to patient. Patient verbally contracts for safety during hospitalization. Will continue to monitor for safety.

## 2021-07-13 DIAGNOSIS — F312 Bipolar disorder, current episode manic severe with psychotic features: Secondary | ICD-10-CM | POA: Diagnosis not present

## 2021-07-13 MED ORDER — QUETIAPINE FUMARATE 50 MG PO TABS: 50.0000 mg | ORAL_TABLET | Freq: Once | ORAL | Status: DC

## 2021-07-13 MED ORDER — LORAZEPAM 1 MG PO TABS
1.0000 mg | ORAL_TABLET | Freq: Once | ORAL | Status: AC
Start: 2021-07-13 — End: 2021-07-13
  Administered 2021-07-13: 1 mg via ORAL
  Filled 2021-07-13: qty 1

## 2021-07-13 MED ORDER — LORAZEPAM 1 MG PO TABS: 1.0000 mg | ORAL_TABLET | Freq: Once | ORAL | Status: DC

## 2021-07-13 MED ORDER — HYDROXYZINE HCL 25 MG PO TABS
25.0000 mg | ORAL_TABLET | Freq: Three times a day (TID) | ORAL | Status: DC | PRN
  Administered 2021-07-13 – 2021-07-14 (×2): 25 mg via ORAL
  Filled 2021-07-13 (×2): qty 1

## 2021-07-13 MED ORDER — DICLOFENAC SODIUM 1 % EX GEL
2.0000 g | Freq: Four times a day (QID) | CUTANEOUS | Status: DC
  Administered 2021-07-13 (×2): 2 g via TOPICAL
  Filled 2021-07-13: qty 100

## 2021-07-13 NOTE — ED Notes (Signed)
Pt up pacing unit and approaching nursing desk repeatedly.  Pt reports she would like to leave.  She denies SI, HI, and AVH at this time.  She states the main reason she came to the [facility] was due to her Seroquel medication not being delivered on time to her house and wanted to get her medication to feel "ok".   Per pt since being on the unit her medication has been delivered.  She reports she has money to call an uber for a ride home.  Breathing is even and unlabored.  Provider notified of pt's request to discharge.

## 2021-07-13 NOTE — Progress Notes (Signed)
Patient has been denied by [facility] due to behavioral history. Patient meets [facility] inpatient criteria per [name], NP. Patient has been faxed out to the following facilities:    [facility]  [place], [place] [address] [place]  [facility]  [address], [place] [address] [phone] [place]  [address] [place] [address] [phone]  [place]  [address], [place] [address] [phone] [place]  [address] [place] [address] [phone]  [facility]  [address], [place] [address] [phone] [facility]  [place], [place] [address] [phone]  [facility]  [address] [place], [place] [address] [phone]  [address] [place] [address] [phone] [facility]  [address] [place] [address] [phone]  [facility]  [address] [place] [address] [place]  [facility]  [address], [place] [address] [phone] [place], MSW, LCSW-A  10:42 AM 07/13/2021

## 2021-07-13 NOTE — ED Notes (Signed)
Patient asleep. No acute distress noted. Will continue to monitor for safety.

## 2021-07-13 NOTE — ED Notes (Signed)
Pt restarted self injuring and created redden mark on left forearm. Notified MD - Almyra Free who came to assess patient . PRN vistaril ordered for the patient which she accepted. Patient then later asked for bandaid to cover reddened area on forearm-no bleeding noted.

## 2021-07-13 NOTE — ED Notes (Signed)
Patient is yelling she wants to go home at the top of her lungs.

## 2021-07-13 NOTE — Progress Notes (Signed)
Pt is requesting to NOT go to [facility]  She reports that "I had a bad experience there in the past."  She did not elaborate on the experience.  Pt requested sleep medication to help her "get through tonight" because she has a lot to do tomorrow and has a lot on her mind.  Pt is slightly agitated and with increased anxiety.  Provider notified and medications ordered.

## 2021-07-13 NOTE — ED Notes (Signed)
Pt presents with depressed mood, affect congruent. [place] reports she continues to have auditory hallucinations. She states '' I was hearing lucifer, I'm still hearing him. Yesterday when I came in I got upset and I banged my head. 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 She reports she was stressed yesterday due to worrying about them shutting off her lights, so she '' had to go pay the bills real quick so they wouldn't turn them off. I had to go take care of all that. '' Patient reports recent hospitalization at [facility]. Patient compliant with am medications. Pt does not appear to be responding to internal stimuli despite reports of AH. Pt is safe, no inappropriate behaviors at this time. Will con't to monitor.

## 2021-07-13 NOTE — Progress Notes (Signed)
[facility] LCSW Progress Note   07/13/2021    1:35 PM  [name]   [id number]   Type of Contact and Topic:  Psychiatric Bed Placement   Pt accepted to [facility]    Patient meets inpatient criteria per Elvin So, NP  The attending provider will be Myles Lipps, MD   Call report to [phone] or [phone]  Evelyn Reader, RN @ [facility] notified.     Pt scheduled  to arrive at [place] 0900.   [name], MSW, LCSW-A  1:36 PM 07/13/2021

## 2021-07-13 NOTE — ED Notes (Signed)
Pt is standing up in the chair and bed, stating that she can fly. Pt is now seated. Pt was told that she has to have a seat due to safety precautions.

## 2021-07-13 NOTE — Progress Notes (Signed)
Pt attempting to self injure. She starts scratching her arm deliberately looking at staff to see if they are watching her, as she scratches her arm. Patient redirected verbally and states '' oh lucifer is telling me to do it. '' Patient redirected and smiled, stating she would stop. Patient continues to exhibit attention seeking behaviors.

## 2021-07-13 NOTE — Progress Notes (Signed)
Pt complained of feeling agitated, having auditory hallucination of '' lucifer'' prn ativan given.

## 2021-07-13 NOTE — ED Provider Notes (Signed)
FBC/OBS ASAP Discharge Summary  Date and Time: 07/13/2021 3:41 PM  Name: [name]  MRN:  [id number]   Discharge Diagnoses:  Final diagnoses:  Bipolar affective disorder, currently manic, severe, with psychotic features ([place])  Borderline personality disorder ([place])   Subjective:   Pt assessed face to face by nurse practitioner today.   Pt w/ hx of bipolar disorder, intellectual disability, autism, depression, and borderline personality disorder. Pt is well known to behavioral health services. Per chart review, pt was hospitalized at [facility] from 07/04/21-07/11/21 following SA on medications. Per chart review, she has had 18 ED visits in the past 6 months.   Pt reports depressed, anxious mood. Reports good sleep and fair appetite. She reports she presented to this facility last night after experiencing SI w/ plan to overdose and AVH of "Lucifer". She denies current VH, does endorse AH of "Lucifer" telling her to "kill yourself, bang your head, smash yourself". She endorses continued SI w/ plan to OD on medications. States "I don't want to live", is unable to identify triggering event. She is unable to verbally contract to safety. She reports VI/HI towards her mother-in-law, w/ plan to "stab her", although denies intent. Reports anger towards her mother-in-law for having custody of her children. Reports her husband passed away.   Pt states that she was recently hospitalized at [facility] following SA via OD on medications. She reports she "lied" at the time of discharge to [facility] providers because she needed to go home to pay her electricity bills.   Pt reports she has a CST, which is a step-down from previous ACT services. She reports she likes her CST, and sees them twice a week. She states she last saw CST yesterday and they were "unable to calm me down".   She denies alcohol, MJA, crack/cocaine, other substance use.    She reports she has support in her ex-partner, who is her current roommate. She  expresses concern that he will be going to [place] for a week and thus will not be able to be support for her.   Pt recommended for inpatient admission. She has been accepted to [facility].  Stay Summary:  Pt is 31 y/o female w/ hx of bipolar disorder, intellectual disability, autism, depression and borderline personality disorder presenting to [facility] on 07/12/21 w/ SI w/ plan to overdose and AVH. On reassessment today, she continues to endorse SI w/ plan to OD on medications and is unable to verbally contract to safety. She has been accepted to [facility].  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 fragile X syndrome    Chronic constipation    Depression    Drug-seeking behavior    Essential tremor    Headache    Overdose of acetaminophen 07/2017   and other meds   Personality disorder ([place])    Schizo-affective psychosis ([place])    Schizoaffective disorder, bipolar type ([place])    Seizures ([place])    Last seizure December 2017   Sleep apnea     Past Surgical History:  Procedure Laterality Date   MOUTH SURGERY  2009 or 2010   Family History:  Family History  Problem Relation Age of Onset   Mental illness Father    Asthma Father    PDD Brother    Seizures Brother    Family Psychiatric History: Pt reports family psychiatric hx is positive. Reports father carries dx of depression. Reports 2 paternal uncles completed suicide. Social History:  Social History   Substance and Sexual Activity  Alcohol Use No   Alcohol/week: 1.0 standard drink of alcohol   Types: 1 Standard drinks or equivalent per week   Comment: denies at this time     Social History   Substance and  Sexual Activity  Drug Use No   Comment: History of cocaine use at age 49 for 4 months    Social History   Socioeconomic History   Marital status: Widowed    Spouse name: Not on file   Number of children: 0   Years of education: Not on file   Highest education level: Not on file  Occupational History   Occupation: disability  Tobacco Use   Smoking status: Former    Packs/day: 0.00    Types: Cigarettes   Smokeless tobacco: Never   Tobacco comments:    Smoked for 2  years age [age]  Vaping Use   Vaping Use: Never used  Substance and Sexual Activity   Alcohol use: No    Alcohol/week: 1.0 standard drink of alcohol    Types: 1 Standard drinks or equivalent per week    Comment: denies at this time   Drug use: No    Comment: History of cocaine use at age 46 for 4 months   Sexual activity: Not Currently    Birth control/protection: None  Other Topics Concern   Not on file  Social History Narrative   Marital status: Widowed      Children: daughter      Lives: with boyfriend, in two story home      Employment:  Disability      Tobacco: quit smoking; smoked for two years.      Alcohol ;none      Drugs: none   Has not traveled outside of the country.   Right handed         Social Determinants of Health   Financial Resource Strain: Not on file  Food Insecurity: Not on file  Transportation Needs: Not on file  Physical Activity: Not on file  Stress: Not on file  Social Connections: Not on file   SDOH:  SDOH Screenings   Alcohol Screen: Low Risk  (07/05/2021)   Alcohol Screen    Last Alcohol Screening Score (AUDIT): 0  Depression (PHQ2-9): Medium Risk (06/27/2021)   Depression (PHQ2-9)    PHQ-2 Score: 9  Financial Resource Strain: Not on file  Food Insecurity: Not on file  Housing: Not on file  Physical Activity: Not on file  Social Connections: Not on file  Stress: Not on file  Tobacco Use: Medium Risk (07/05/2021)   Patient History    Smoking Tobacco Use: Former     Smokeless Tobacco Use: Never    Passive Exposure: Not on file  Transportation Needs: Not on file    Tobacco Cessation:    Current Medications:  Current Facility-Administered Medications  Medication Dose Route Frequency Provider Last Rate Last Admin   alum & mag hydroxide-simeth (MAALOX/MYLANTA) 200-200-20 MG/5ML suspension 30 mL  30 mL Oral Q4H PRN [name] A, NP       cariprazine (VRAYLAR) capsule 3 mg  3 mg Oral Daily [name]  A, NP   3 mg at 07/13/21 0942   escitalopram (LEXAPRO) tablet 10 mg  10 mg Oral Daily [name] A, NP   10 mg at 07/13/21 0942   feeding supplement (ENSURE ENLIVE / ENSURE PLUS) liquid 237 mL  237 mL Oral BID BM [name] A, NP   237 mL at 07/13/21 1309   fluticasone furoate-vilanterol (BREO ELLIPTA) 200-25 MCG/ACT 1 puff  1 puff Inhalation Daily [name] A, NP       gabapentin (NEURONTIN) capsule 300 mg  300 mg Oral TID [name] A, NP   300 mg at 07/13/21 0942   magnesium hydroxide (MILK OF MAGNESIA) suspension 30 mL  30 mL Oral Daily PRN [name] A, NP       melatonin tablet 3 mg  3 mg Oral QHS [name] A, NP   3 mg at 07/12/21 2159   pantoprazole (PROTONIX) EC tablet 40 mg  40 mg Oral Daily [name] A, NP   40 mg at 07/13/21 0942   propranolol (INDERAL) tablet 10 mg  10 mg Oral BID [name] A, NP   10 mg at 07/13/21 5176   QUEtiapine (SEROQUEL) tablet 50 mg  50 mg Oral QHS [name] A, NP   50 mg at 07/12/21 2159   Current Outpatient Medications  Medication Sig Dispense Refill   albuterol (VENTOLIN HFA) 108 (90 Base) MCG/ACT inhaler Inhale 2 puffs into the lungs every 6 (six) hours as needed for wheezing or shortness of breath.     budesonide-formoterol (SYMBICORT) 160-4.5 MCG/ACT inhaler Inhale 2 puffs into the lungs 2 (two) times daily.     calcium carbonate (TUMS - DOSED IN MG ELEMENTAL CALCIUM) 500 MG chewable tablet Chew 1 tablet by mouth daily as needed for indigestion or heartburn.     cariprazine (VRAYLAR) 3  MG capsule Take 1 capsule (3 mg total) by mouth daily. 30 capsule    diclofenac Sodium (VOLTAREN) 1 % GEL Apply 2 g topically 4 (four) times daily. 100 g 0   escitalopram (LEXAPRO) 10 MG tablet Take 1 tablet (10 mg total) by mouth daily.     gabapentin (NEURONTIN) 300 MG capsule Take 1 capsule (300 mg total) by mouth 3 (three) times daily for 7 days. 21 capsule 0   hydrOXYzine (ATARAX) 10 MG tablet Take 1 tablet (10 mg total) by mouth 3 (three) times daily as needed for anxiety. 30 tablet 0   melatonin 3 MG TABS tablet Take 3 mg by mouth at bedtime.     pantoprazole (PROTONIX) 40 MG tablet TAKE 1 TABLET BY MOUTH DAILY (Patient taking differently: 40 mg daily.) 30 tablet 0   propranolol (INDERAL) 10 MG tablet Take 1 tablet (10 mg total) by mouth 2 (two) times daily.     QUEtiapine (SEROQUEL) 50 MG tablet Take 1 tablet (50 mg total) by mouth at bedtime for 7 days. 7 tablet 0   traZODone (DESYREL) 50 MG tablet Take 1 tablet (50 mg total) by mouth at bedtime as needed for sleep.      PTA Medications: (Not in a hospital admission)      06/27/2021    3:18 AM 05/01/2021    2:16 AM 02/03/2020    2:24 AM  Depression screen PHQ 2/9  Decreased Interest '1 2 3  '$ Down, Depressed, Hopeless '2 3 3  '$ PHQ - 2 Score '3 5 6  '$ Altered sleeping 0 0 3  Tired, decreased energy '1 1 3  '$ Change in appetite  0 1 3  Feeling bad or failure about yourself  '1 3 3  '$ Trouble concentrating '2 3 3  '$ Moving slowly or fidgety/restless '1 1 3  '$ Suicidal thoughts '1 3 3  '$ PHQ-9 Score '9 17 27  '$ Difficult doing work/chores Somewhat difficult Extremely dIfficult     Flowsheet Row Admission (Discharged) from 07/04/2021 in [place] [facility] ED to Hosp-Admission (Discharged) from 07/01/2021 in [place] ED from 06/26/2021 in [place] No Risk High Risk High Risk       Musculoskeletal  Strength & Muscle Tone: within normal  limits Gait & Station: normal Patient leans: N/A  Psychiatric Specialty Exam  Presentation  General Appearance: Appropriate for Environment; Fairly Groomed; Casual  Eye Contact:Good  Speech:Clear and Coherent; Normal Rate  Speech Volume:Normal  Handedness:Right   Mood and Affect  Mood:Depressed; Anxious  Affect:Congruent   Thought Process  Thought Processes:Coherent; Linear; Goal Directed  Descriptions of Associations:Intact  Orientation:Full (Time, Place and Person)  Thought Content:Logical  Diagnosis of Schizophrenia or Schizoaffective disorder in past: Yes  Duration of Psychotic Symptoms: Greater than six months   Hallucinations:Hallucinations: Auditory Description of Auditory Hallucinations: Reports AH of "Lucifer" telling her to "kill yourself, bang your head, smash yourself" Description of Visual Hallucinations: Denies current VH  Ideas of Reference:None  Suicidal Thoughts:Suicidal Thoughts: Yes, Active SI Active Intent and/or Plan: With Plan; With Intent  Homicidal Thoughts:Homicidal Thoughts: Yes, Active HI Active Intent and/or Plan: With Plan; Without Intent   Sensorium  Memory:Immediate Good  Judgment:Poor  Insight:Present   Executive Functions  Concentration:Fair  Attention Span:Fair  [place]   Psychomotor Activity  Psychomotor Activity:Psychomotor Activity: Normal   Assets  Assets:Communication Skills; Desire for Improvement; Financial Resources/Insurance; Housing; Social Support   Sleep  Sleep:Sleep: Good   Nutritional Assessment (For OBS and FBC admissions only) Has the patient had a weight loss or gain of 10 pounds or more in the last 3 months?: No Has the patient had a decrease in food intake/or appetite?: No Does the patient have dental problems?: No Does the patient have eating habits or behaviors that may be indicators of an eating disorder including binging or inducing  vomiting?: No Has the patient recently lost weight without trying?: 0 Has the patient been eating poorly because of a decreased appetite?: 0 Malnutrition Screening Tool Score: 0    Physical Exam  Physical Exam Cardiovascular:     Rate and Rhythm: Normal rate.  Pulmonary:     Effort: Pulmonary effort is normal.  Neurological:     Mental Status: She is alert and oriented to person, place, and time.  Psychiatric:        Attention and Perception: Attention normal. She perceives auditory hallucinations.        Mood and Affect: Mood is anxious and depressed.        Speech: Speech normal.        Behavior: Behavior normal. Behavior is cooperative.        Thought Content: Thought content includes homicidal and suicidal ideation. Thought content includes suicidal plan.    Review of Systems  Constitutional:  Negative for chills and fever.  Respiratory:  Negative for shortness of breath.   Cardiovascular:  Negative for chest pain.  Psychiatric/Behavioral:  Positive for depression, hallucinations and suicidal ideas.    Blood pressure 110/77, pulse 92, temperature 98.5 F (36.9 C), temperature source Oral, resp. rate  18, SpO2 99 %. There is no height or weight on file to calculate BMI.  Demographic Factors:  Divorced or widowed, Caucasian, and Unemployed  Loss Factors: Loss of significant relationship  Historical Factors: Prior suicide attempts, Family history of suicide, Family history of mental illness or substance abuse, and Impulsivity  Risk Reduction Factors:   Living with another person, especially a relative  Continued Clinical Symptoms:  Previous Psychiatric Diagnoses and Treatments  Cognitive Features That Contribute To Risk:  None    Suicide Risk:  Severe:  Frequent, intense, and enduring suicidal ideation, specific plan, no subjective intent, but some objective markers of intent (i.e., choice of lethal method), the method is accessible, some limited preparatory  behavior, evidence of impaired self-control, severe dysphoria/symptomatology, multiple risk factors present, and few if any protective factors, particularly a lack of social support.  Plan Of Care/Follow-up recommendations:  Inpatient admission  Disposition:  Inpatient admission  Tharon Aquas, NP 07/13/2021, 3:41 PM

## 2021-07-14 DIAGNOSIS — F603 Borderline personality disorder: Secondary | ICD-10-CM | POA: Diagnosis not present

## 2021-07-14 DIAGNOSIS — F312 Bipolar disorder, current episode manic severe with psychotic features: Secondary | ICD-10-CM | POA: Diagnosis not present

## 2021-07-14 DIAGNOSIS — F4310 Post-traumatic stress disorder, unspecified: Secondary | ICD-10-CM | POA: Diagnosis not present

## 2021-07-14 DIAGNOSIS — F250 Schizoaffective disorder, bipolar type: Secondary | ICD-10-CM | POA: Diagnosis not present

## 2021-07-14 NOTE — ED Notes (Signed)
Patient in bed eyes closed and snoring. No acute distress noted. Will continue to monitor and update as needed.

## 2021-07-14 NOTE — Progress Notes (Signed)
[name] spoke with the NP and afterwards received her discharge order. She refused her medications, stating she will take them at home. She denied all of the psychiatric symptoms this morning. She retrieved her personal belongings after receiving her AVS and her questions were answered. She was discharged to the lobby to catch a Urber per her request.

## 2021-07-14 NOTE — Discharge Instructions (Addendum)
Please follow up with your CST. Please attend your appointment today with your psychiatrist at 10:30AM.  Patient is instructed prior to discharge to: Take all medications as prescribed by his/her mental healthcare provider. Report any adverse effects and or reactions from the medicines to his/her outpatient provider promptly. Keep all scheduled appointments, to ensure that you are getting refills on time and to avoid any interruption in your medication.  If you are unable to keep an appointment call to reschedule.  Be sure to follow-up with resources and follow-up appointments provided.  Patient has been instructed & cautioned: To not engage in alcohol and or illegal drug use while on prescription medicines. In the event of worsening symptoms, patient is instructed to call the crisis hotline, 911 and or go to the nearest ED for appropriate evaluation and treatment of symptoms. To follow-up with his/her primary care provider for your other medical issues, concerns and or health care needs.   Safety plan:  Warning signs: Hearing vocies Anxious Angry  Coping skills: Pension scheme manager Exercise -- low cardio, things that won't hurt my knees Word searches  People/Services to contact in a crisis: My CST [phone]

## 2021-07-14 NOTE — ED Provider Notes (Signed)
Pt is a 31 y/o female w/ hx of bipolar disorder, intellectual disability, autism, depression and borderline personality disorder presenting to [facility] on 07/12/21 w/ plan to overdose and AVH. Yesterday, pt was accepted to [facility] for inpatient admission after 9:00AM today. On reassessment today, pt denies SI/VI/HI. She denies AVH, paranoia. She informs me "I feel much better today. I'm going to do everything my therapist tells me to. I just needed a break. Probably because before coming here I hadn't taken my medication in 24 hours." She states she spoke w/ her ex-partner/roommate while on the unit who let her know that her medications arrived at home. She declines to allow collateral to be obtained, states "He doesn't want to be involved right now". She does maintain that he is a support for her. She is forward thinking, states she has an appointment today w/ her CST psychiatrist at 10:30AM and requests that she be discharged before then so that she can attend the appointment. She states she feels safe going home. She verbally contracts to safety. She states "I have a lot of coping skills". Safety planning completed w/ pt, including:  Safety plan:  Warning signs: Hearing vocies Anxious Angry  Coping skills: Pension scheme manager Exercise -- low cardio, things that won't hurt my knees Word searches  People/Services to contact in a crisis: My CST [phone]  Pt is a&ox3, in no acute distress, non-toxic appearing. She appears fairly groomed, casually dressed, appropriate for environment. Eye contact is good. Speech is clear and coherent, w/ nml rate and volume. Reported mood is euthymic. Affect is congruent. TP is coherent, goal-directed, linear. Description of associations is intact. TC is logical. There is no evidence of responding to internal stimuli, agitation or aggression. Pt is calm, cooperative, pleasant.  Pt does not meet IVC  criteria at this time. She will be discharged w/ recommendation to follow up w/ her CST, including attending her appointment today w/ her psychiatrist at 10:30AM.

## 2021-07-14 NOTE — ED Notes (Addendum)
Patient woke up this morning saying "I want to go home several time." Nurse talked to the patient and encouraged her to go back to sleep and see the doctor later. Patient in bed, awake waiting to talk to the nurse again

## 2021-07-15 ENCOUNTER — Ambulatory Visit (HOSPITAL_COMMUNITY)
Admission: EM | Admit: 2021-07-15 | Discharge: 2021-07-17 | Disposition: A | Discharge status: Home / Self Care | Payer: PPO | Attending: Nurse Practitioner | Admitting: Nurse Practitioner

## 2021-07-15 DIAGNOSIS — F603 Borderline personality disorder: Secondary | ICD-10-CM | POA: Diagnosis not present

## 2021-07-15 DIAGNOSIS — Z20822 Contact with and (suspected) exposure to covid-19: Secondary | ICD-10-CM | POA: Insufficient documentation

## 2021-07-15 DIAGNOSIS — F315 Bipolar disorder, current episode depressed, severe, with psychotic features: Secondary | ICD-10-CM | POA: Diagnosis not present

## 2021-07-15 DIAGNOSIS — Z9151 Personal history of suicidal behavior: Secondary | ICD-10-CM | POA: Diagnosis not present

## 2021-07-15 DIAGNOSIS — Z79899 Other long term (current) drug therapy: Secondary | ICD-10-CM | POA: Insufficient documentation

## 2021-07-15 DIAGNOSIS — F319 Bipolar disorder, unspecified: Secondary | ICD-10-CM | POA: Diagnosis present

## 2021-07-15 DIAGNOSIS — R45851 Suicidal ideations: Secondary | ICD-10-CM | POA: Insufficient documentation

## 2021-07-15 DIAGNOSIS — F411 Generalized anxiety disorder: Secondary | ICD-10-CM | POA: Insufficient documentation

## 2021-07-15 DIAGNOSIS — T1491XA Suicide attempt, initial encounter: Secondary | ICD-10-CM

## 2021-07-15 LAB — POCT URINE DRUG SCREEN - MANUAL ENTRY (I-SCREEN)
POC Amphetamine UR: NOT DETECTED
POC Buprenorphine (BUP): NOT DETECTED
POC Cocaine UR: NOT DETECTED
POC Marijuana UR: NOT DETECTED
POC Methadone UR: NOT DETECTED
POC Methamphetamine UR: NOT DETECTED
POC Morphine: NOT DETECTED
POC Oxazepam (BZO): POSITIVE — AB
POC Oxycodone UR: NOT DETECTED
POC Secobarbital (BAR): NOT DETECTED

## 2021-07-15 LAB — POC SARS CORONAVIRUS 2 AG: SARSCOV2ONAVIRUS 2 AG: NEGATIVE

## 2021-07-15 LAB — POC SARS CORONAVIRUS 2 AG -  ED: SARS Coronavirus 2 Ag: NEGATIVE

## 2021-07-15 LAB — POCT PREGNANCY, URINE: Preg Test, Ur: NEGATIVE

## 2021-07-15 MED ORDER — TRAZODONE HCL 50 MG PO TABS: 50.0000 mg | ORAL_TABLET | Freq: Every evening | ORAL | Status: DC | PRN

## 2021-07-15 MED ORDER — TRIPLE ANTIBIOTIC 3.5-400-5000 EX OINT
1.0000 "application " | TOPICAL_OINTMENT | Freq: Two times a day (BID) | CUTANEOUS | Status: DC | PRN
  Administered 2021-07-16: 1 via TOPICAL
  Filled 2021-07-15: qty 1

## 2021-07-15 MED ORDER — HYDROXYZINE HCL 10 MG PO TABS
10.0000 mg | ORAL_TABLET | Freq: Three times a day (TID) | ORAL | Status: DC | PRN
  Administered 2021-07-15: 10 mg via ORAL
  Filled 2021-07-15: qty 1

## 2021-07-15 MED ORDER — ESCITALOPRAM OXALATE 10 MG PO TABS
10.0000 mg | ORAL_TABLET | Freq: Every day | ORAL | Status: DC
  Administered 2021-07-15 – 2021-07-17 (×3): 10 mg via ORAL
  Filled 2021-07-15 (×4): qty 1

## 2021-07-15 MED ORDER — GABAPENTIN 300 MG PO CAPS
300.0000 mg | ORAL_CAPSULE | Freq: Three times a day (TID) | ORAL | Status: DC
  Administered 2021-07-15 – 2021-07-17 (×4): 300 mg via ORAL
  Filled 2021-07-15 (×5): qty 1

## 2021-07-15 MED ORDER — ALBUTEROL SULFATE HFA 108 (90 BASE) MCG/ACT IN AERS: 2.0000 | INHALATION_SPRAY | Freq: Four times a day (QID) | RESPIRATORY_TRACT | Status: DC | PRN

## 2021-07-15 MED ORDER — CARIPRAZINE HCL 3 MG PO CAPS
3.0000 mg | ORAL_CAPSULE | Freq: Every day | ORAL | Status: DC
  Administered 2021-07-15 – 2021-07-17 (×3): 3 mg via ORAL
  Filled 2021-07-15 (×4): qty 1

## 2021-07-15 MED ORDER — PANTOPRAZOLE SODIUM 40 MG PO TBEC
40.0000 mg | DELAYED_RELEASE_TABLET | Freq: Every day | ORAL | Status: DC
  Administered 2021-07-15 – 2021-07-17 (×3): 40 mg via ORAL
  Filled 2021-07-15 (×4): qty 1

## 2021-07-15 MED ORDER — ALUM & MAG HYDROXIDE-SIMETH 200-200-20 MG/5ML PO SUSP: 30.0000 mL | ORAL | Status: DC | PRN

## 2021-07-15 MED ORDER — MAGNESIUM HYDROXIDE 400 MG/5ML PO SUSP
30.0000 mL | Freq: Every day | ORAL | Status: DC | PRN
  Administered 2021-07-17: 30 mL via ORAL
  Filled 2021-07-15: qty 30

## 2021-07-15 MED ORDER — PROPRANOLOL HCL 10 MG PO TABS
10.0000 mg | ORAL_TABLET | Freq: Two times a day (BID) | ORAL | Status: DC
  Administered 2021-07-15 – 2021-07-17 (×3): 10 mg via ORAL
  Filled 2021-07-15 (×4): qty 1

## 2021-07-15 MED ORDER — DICLOFENAC SODIUM 1 % EX GEL
2.0000 g | Freq: Four times a day (QID) | CUTANEOUS | Status: DC
  Administered 2021-07-15 – 2021-07-17 (×3): 2 g via TOPICAL
  Filled 2021-07-15: qty 100

## 2021-07-15 MED ORDER — MELATONIN 3 MG PO TABS
3.0000 mg | ORAL_TABLET | Freq: Every day | ORAL | Status: DC
  Administered 2021-07-15 – 2021-07-16 (×2): 3 mg via ORAL
  Filled 2021-07-15 (×2): qty 1

## 2021-07-15 MED ORDER — QUETIAPINE FUMARATE 50 MG PO TABS
50.0000 mg | ORAL_TABLET | Freq: Every day | ORAL | Status: DC
  Administered 2021-07-15 – 2021-07-16 (×2): 50 mg via ORAL
  Filled 2021-07-15 (×2): qty 1
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
## 2021-07-15 NOTE — Progress Notes (Signed)
   07/15/21 1935  Patient Reported Information  How Did You Hear About [name]? Legal System  What Is the Reason for Your Visit/Call Today? Pt reports, she suicidal with a plan and access to mean, hearing and seeing things. Pt reports, she wants to die. Pt was previously assessed at [facility] on 07/12/2021.  How Long Has This Been Causing You Problems? > than 6 months  What Do You Feel Would Help You the Most Today? Medication(s);Stress Management  Have You Recently Had Any Thoughts About Hurting Yourself? Yes  Are You Planning to Commit Suicide/Harm Yourself At This time? Yes  Have you Recently Had Thoughts About Hurting Someone [name]? Yes  Are You Planning To Harm Someone At This Time? No  Have You Used Any Alcohol or Drugs in the Past 24 Hours? No  Do You Currently Have a Therapist/Psychiatrist? Yes  Name of Therapist/Psychiatrist Pt is linked to CST for medication management and therapy. Per provider note on 07/14/2021: "states she has an appointment today w/ her CST psychiatrist at 10:30AM and requests that she be discharged before then so that she can attend the appointment."  CCA Screening Triage Referral Assessment  Type of Contact Face-to-Face  Is this Initial or Reassessment? Initial Assessment  Location of Assessment [facility] Assessment Services  Provider location [facility] Assessment Services  Collateral Involvement Pt denies, having supports.  Is CPS involved or ever been involved? In the Past (Pt reports, her mother-in-law has her daughter.)  Patient Determined To Be At Risk for Harm To Self or Others Based on Review of Patient Reported Information or Presenting Complaint? Yes, for Self-Harm  Contacted To Inform of Risk of Harm To Self or Others: Law Enforcement  Does Patient Present under Involuntary Commitment? No  [place] of Residence [name]  Patient Currently Receiving the Following Services: Individual Therapy;Medication Management  Determination of Need Emergent (2 hours)  Options  For Referral Medication Management;Outpatient Therapy;Inpatient Hospitalization;[facility] Urgent Care;Facility-Based Crisis    Determination of need: Emergent.    Vertell Novak, [place], [facility], [facility] Triage Specialist [phone]

## 2021-07-15 NOTE — ED Provider Notes (Signed)
[facility] Continuous Assessment Admission H&P  Date: 07/16/21 Patient Name: [name] MRN: [id number] Chief Complaint: AVH    Diagnoses:  Final diagnoses:  Bipolar I disorder, current or most recent episode depressed, with psychotic features ([place])  Borderline personality disorder ([place])    HPI: [name] is a 31 y/o female with a history of bipolar disorder, intellectual disability, autism, depression, and borderline personality disorder presenting to [facility] via [facility] on 07/15/21. Pt has had 19 ED visits in the last 6 months with similar complaints. Patient reports that she has been experiencing AVH. Patient states that she has been seeing "Lucifer" who tells her she is ugly and worthless. Patient has been engaging in self injurious behaviors has superficial cuts to bilateral arms. Patient states that she is tired of hearing voices and that she is tired of living, patient stated "I want to die". Patient stated she is having a lot of financial worries due to being employed. Patient reports that she is in a lot of credit card debt and she is jot able to maintain employment because of the voices. Patient reports that she has a 63 year old daughter who live with the father's mother who has custody. Patient reports another stressor is that her daughter's grandmother will not allow her to have any relationship with her daughter. Patient states that she does not have any friends or family members that she feels is supportive. Patient is currently receiving CST from [facility] and reports that she has a good relationship with her therapist Butch Penny.   Of note patient presented to [facility] with similar complaint on 07/12/21 inpatient treatment was recommended at that time and patient was accepted to [facility] and patient declined admission at that time. Patient stated that the last time she was admitted to [facility] she was not able to find transportation home and she had to take an uber  home from the hospital and she had spend 100.00.  Patient endorses feelings of hopelessness, worthlessness with poor sleep and decreased appetite and constantly worrying. Patient denies any alcohol use or any other illicit substance use. Patient is alert oriented x4, endorsing SI and AVH. Patient is not able to contract for safety. Patient will be recommended for continuous assessment at [facility] for safety, crisis management and stabilization. Patient is agreement with plan for inpatient treatment at [facility].  PHQ 2-9:  [place] ED from 06/26/2021 in [place] ED from 05/01/2021 in [facility] ED from 02/03/2020 in [facility]  Thoughts that you would be better off dead, or of hurting yourself in some way Several days Nearly every day Nearly every day  [Phreesia 02/03/2020]  PHQ-9 Total Score '9 17 27       '$ Flowsheet Row ED from 07/15/2021 in [facility] Admission (Discharged) from 07/04/2021 in [facility] ED to Hosp-Admission (Discharged) from 07/01/2021 in [place] CV PROGRESSIVE CARE  C-SSRS RISK CATEGORY High Risk No Risk High Risk        Total Time spent with patient: 30 minutes  Musculoskeletal  Strength & Muscle Tone: within normal limits Gait & Station: normal Patient leans: N/A  Psychiatric Specialty Exam  Presentation General Appearance: Disheveled  Eye Contact:Good  Speech:Clear and Coherent  Speech Volume:Normal  Handedness:Right   Mood and Affect  Mood:Dysphoric  Affect:Depressed; Flat; Tearful   Thought Process  Thought Processes:Coherent  Descriptions of  Associations:Intact  Orientation:Full (Time, Place and Person)  Thought Content:Delusions; Tangential; Rumination  Diagnosis of Schizophrenia or Schizoaffective disorder in past: Yes  Duration of Psychotic Symptoms: Greater than six  months  Hallucinations:Hallucinations: Auditory Description of Auditory Hallucinations: Lucifer tells me to hurt myself Description of Visual Hallucinations: Seeing Lucifer  Ideas of Reference:None  Suicidal Thoughts:Suicidal Thoughts: Yes, Passive SI Active Intent and/or Plan: With Intent; With Plan SI Passive Intent and/or Plan: With Plan  Homicidal Thoughts:Homicidal Thoughts: No HI Active Intent and/or Plan: Without Intent; Without Plan   Sensorium  Memory:Immediate Good; Recent Fair; Remote Fair  Judgment:Poor  Insight:Poor   Executive Functions  Concentration:Fair  Attention Span:Fair  [place]   Psychomotor Activity  Psychomotor Activity:Psychomotor Activity: Normal   Assets  Assets:Communication Skills; Desire for Improvement; Resilience   Sleep  Sleep:Sleep: Poor Number of Hours of Sleep: -1   Nutritional Assessment (For OBS and FBC admissions only) Has the patient had a weight loss or gain of 10 pounds or more in the last 3 months?: No Has the patient had a decrease in food intake/or appetite?: Yes Does the patient have dental problems?: No Does the patient have eating habits or behaviors that may be indicators of an eating disorder including binging or inducing vomiting?: No Has the patient recently lost weight without trying?: 0 Has the patient been eating poorly because of a decreased appetite?: 0 Malnutrition Screening Tool Score: 0    Physical Exam HENT:     Head: Normocephalic and atraumatic.     Nose: Nose normal.  Eyes:     Pupils: Pupils are equal, round, and reactive to light.  Cardiovascular:     Rate and Rhythm: Normal rate.  Pulmonary:     Effort: Pulmonary effort is normal.  Abdominal:     General: Abdomen is flat.  Musculoskeletal:        General: Normal range of motion.     Cervical back: Normal range of motion.  Skin:    General: Skin is warm.  Neurological:     Mental  Status: She is alert and oriented to person, place, and time.  Psychiatric:        Attention and Perception: She perceives auditory and visual hallucinations.        Mood and Affect: Mood is anxious and depressed. Affect is tearful.        Speech: Speech is tangential.        Behavior: Behavior is cooperative.        Thought Content: Thought content includes suicidal ideation.        Cognition and Memory: Cognition normal.        Judgment: Judgment is impulsive.    Review of Systems  Constitutional: Negative.   HENT: Negative.    Eyes: Negative.   Respiratory: Negative.    Cardiovascular: Negative.   Gastrointestinal: Negative.   Genitourinary: Negative.   Musculoskeletal: Negative.   Skin: Negative.   Neurological: Negative.   Endo/Heme/Allergies: Negative.   Psychiatric/Behavioral:  Positive for depression, hallucinations and suicidal ideas.     Blood pressure (!) 85/70, pulse 93, temperature 98.6 F (37 C), temperature source Oral, resp. rate 18, SpO2 99 %. There is no height or weight on file to calculate BMI.  Past Psychiatric History: [facility] 5/30-6/6  Is the patient at risk to self? Yes  Has the patient been a risk to self in the past 6 months? Yes .    Has the patient been a  risk to self within the distant past? No   Is the patient a risk to others? No   Has the patient been a risk to others in the past 6 months? No   Has the patient been a risk to others within the distant past? 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    Chronic constipation    Depression    Drug-seeking behavior    Essential tremor    Headache    Overdose of acetaminophen 07/2017   and other meds   Personality disorder ([place])    Schizo-affective psychosis ([place])    Schizoaffective disorder, bipolar type ([place])    Seizures ([place])    Last seizure December 2017    Sleep apnea     Past Surgical History:  Procedure Laterality Date   MOUTH SURGERY  2009 or 2010    Family History:  Family History  Problem Relation Age of Onset   Mental illness Father    Asthma Father    PDD Brother    Seizures Brother     Social History:  Social History   Socioeconomic History   Marital status: Widowed    Spouse name: Not on file   Number of children: 0   Years of education: Not on file   Highest education level: Not on file  Occupational History   Occupation: disability  Tobacco Use   Smoking status: Former    Packs/day: 0.00    Types: Cigarettes   Smokeless tobacco: Never   Tobacco comments:    Smoked for 2  years age [age]  Vaping Use   Vaping Use: Never used  Substance and Sexual Activity   Alcohol use: No    Alcohol/week: 1.0 standard drink of alcohol    Types: 1 Standard drinks or equivalent per week    Comment: denies at this time   Drug use: No    Comment: History of cocaine use at age 25 for 4 months   Sexual activity: Not Currently    Birth control/protection: None  Other Topics Concern   Not on file  Social History Narrative   Marital status: Widowed      Children: daughter      Lives: with boyfriend, in two story home      Employment:  Disability      Tobacco: quit smoking; smoked for two years.      Alcohol ;none      Drugs: none   Has not traveled outside of the country.   Right handed         Social Determinants of Health   Financial Resource Strain: Not on file  Food Insecurity: Not on file  Transportation Needs: Not on file  Physical Activity: Not on file  Stress: Not on file  Social Connections: Not on file  Intimate Partner Violence: Not on file    SDOH:  SDOH Screenings   Alcohol Screen: Low Risk  (07/05/2021)   Alcohol Screen    Last Alcohol Screening Score (AUDIT): 0  Depression (PHQ2-9): Medium Risk (06/27/2021)   Depression (PHQ2-9)    PHQ-2 Score: 9  Financial Resource Strain: Not on file   Food Insecurity: Not on file  Housing: Not on file  Physical Activity: Not on file  Social Connections: Not on  file  Stress: Not on file  Tobacco Use: Medium Risk (07/05/2021)   Patient History    Smoking Tobacco Use: Former    Smokeless Tobacco Use: Never    Passive Exposure: Not on file  Transportation Needs: Not on file    Last Labs:  Admission on 07/15/2021  Component Date Value Ref Range Status   SARS Coronavirus 2 by RT PCR 07/15/2021 NEGATIVE  NEGATIVE Final   Comment: (NOTE) SARS-CoV-2 target nucleic acids are NOT DETECTED.  The SARS-CoV-2 RNA is generally detectable in upper respiratory specimens during the acute phase of infection. The lowest concentration of SARS-CoV-2 viral copies this assay can detect is 138 copies/mL. A negative result does not preclude SARS-Cov-2 infection and should not be used as the sole basis for treatment or other patient management decisions. A negative result may occur with  improper specimen collection/handling, submission of specimen other than nasopharyngeal swab, presence of viral mutation(s) within the areas targeted by this assay, and inadequate number of viral copies(<138 copies/mL). A negative result must be combined with clinical observations, patient history, and epidemiological information. The expected result is Negative.  Fact Sheet for Patients:  EntrepreneurPulse.com.au  Fact Sheet for Healthcare Providers:  IncredibleEmployment.be  This test is no                          t yet approved or cleared by the [place] FDA and  has been authorized for detection and/or diagnosis of SARS-CoV-2 by FDA under an Emergency Use Authorization (EUA). This EUA will remain  in effect (meaning this test can be used) for the duration of the COVID-19 declaration under Section 564(b)(1) of the Act, 21 U.S.C.section 360bbb-3(b)(1), unless the authorization is terminated  or revoked sooner.        Influenza A by PCR 07/15/2021 NEGATIVE  NEGATIVE Final   Influenza B by PCR 07/15/2021 NEGATIVE  NEGATIVE Final   Comment: (NOTE) The Xpert Xpress SARS-CoV-2/FLU/RSV plus assay is intended as an aid in the diagnosis of influenza from Nasopharyngeal swab specimens and should not be used as a sole basis for treatment. Nasal washings and aspirates are unacceptable for Xpert Xpress SARS-CoV-2/FLU/RSV testing.  Fact Sheet for Patients: EntrepreneurPulse.com.au  Fact Sheet for Healthcare Providers: IncredibleEmployment.be  This test is not yet approved or cleared by the [place] FDA and has been authorized for detection and/or diagnosis of SARS-CoV-2 by FDA under an Emergency Use Authorization (EUA). This EUA will remain in effect (meaning this test can be used) for the duration of the COVID-19 declaration under Section 564(b)(1) of the Act, 21 U.S.C. section 360bbb-3(b)(1), unless the authorization is terminated or revoked.  Performed at [facility], [place] [address] [place], [place] [address]    POC Amphetamine UR 07/15/2021 None Detected  NONE DETECTED (Cut Off Level 1000 ng/mL) Final   POC Secobarbital (BAR) 07/15/2021 None Detected  NONE DETECTED (Cut Off Level 300 ng/mL) Final   POC Buprenorphine (BUP) 07/15/2021 None Detected  NONE DETECTED (Cut Off Level 10 ng/mL) Final   POC Oxazepam (BZO) 07/15/2021 Positive (A)  NONE DETECTED (Cut Off Level 300 ng/mL) Final   POC Cocaine UR 07/15/2021 None Detected  NONE DETECTED (Cut Off Level 300 ng/mL) Final   POC Methamphetamine UR 07/15/2021 None Detected  NONE DETECTED (Cut Off Level 1000 ng/mL) Final   POC Morphine 07/15/2021 None Detected  NONE DETECTED (Cut Off Level 300 ng/mL) Final   POC Methadone UR 07/15/2021 None Detected  NONE DETECTED (Cut Off Level 300 ng/mL) Final   POC Oxycodone UR 07/15/2021 None Detected  NONE DETECTED (Cut Off Level 100 ng/mL) Final   POC Marijuana UR  07/15/2021 None Detected  NONE DETECTED (Cut Off Level 50 ng/mL) Final   SARSCOV2ONAVIRUS 2 AG 07/15/2021 NEGATIVE  NEGATIVE Final   Comment: (NOTE) SARS-CoV-2 antigen NOT DETECTED.   Negative results are presumptive.  Negative results do not preclude SARS-CoV-2 infection and should not be used as the sole basis for treatment or other patient management decisions, including infection  control decisions, particularly in the presence of clinical signs and  symptoms consistent with COVID-19, or in those who have been in contact with the virus.  Negative results must be combined with clinical observations, patient history, and epidemiological information. The expected result is Negative.  Fact Sheet for Patients: HandmadeRecipes.com.cy  Fact Sheet for Healthcare Providers: FuneralLife.at  This test is not yet approved or cleared by the [place] FDA and  has been authorized for detection and/or diagnosis of SARS-CoV-2 by FDA under an Emergency Use Authorization (EUA).  This EUA will remain in effect (meaning this test can be used) for the duration of  the COV                          ID-19 declaration under Section 564(b)(1) of the Act, 21 U.S.C. section 360bbb-3(b)(1), unless the authorization is terminated or revoked sooner.     Preg Test, Ur 07/15/2021 NEGATIVE  NEGATIVE Final   Comment:        THE SENSITIVITY OF THIS METHODOLOGY IS >24 mIU/mL   Admission on 07/12/2021, Discharged on 07/14/2021  Component Date Value Ref Range Status   SARS Coronavirus 2 by RT PCR 07/12/2021 NEGATIVE  NEGATIVE Final   Comment: (NOTE) SARS-CoV-2 target nucleic acids are NOT DETECTED.  The SARS-CoV-2 RNA is generally detectable in upper respiratory specimens during the acute phase of infection. The lowest concentration of SARS-CoV-2 viral copies this assay can detect is 138 copies/mL. A negative result does not preclude SARS-Cov-2 infection and  should not be used as the sole basis for treatment or other patient management decisions. A negative result may occur with  improper specimen collection/handling, submission of specimen other than nasopharyngeal swab, presence of viral mutation(s) within the areas targeted by this assay, and inadequate number of viral copies(<138 copies/mL). A negative result must be combined with clinical observations, patient history, and epidemiological information. The expected result is Negative.  Fact Sheet for Patients:  EntrepreneurPulse.com.au  Fact Sheet for Healthcare Providers:  IncredibleEmployment.be  This test is no                          t yet approved or cleared by the [place] FDA and  has been authorized for detection and/or diagnosis of SARS-CoV-2 by FDA under an Emergency Use Authorization (EUA). This EUA will remain  in effect (meaning this test can be used) for the duration of the COVID-19 declaration under Section 564(b)(1) of the Act, 21 U.S.C.section 360bbb-3(b)(1), unless the authorization is terminated  or revoked sooner.       Influenza A by PCR 07/12/2021 NEGATIVE  NEGATIVE Final   Influenza B by PCR 07/12/2021 NEGATIVE  NEGATIVE Final   Comment: (NOTE) The Xpert Xpress SARS-CoV-2/FLU/RSV plus assay is intended as an aid in the diagnosis of influenza from Nasopharyngeal swab specimens and should not be used as a sole basis  for treatment. Nasal washings and aspirates are unacceptable for Xpert Xpress SARS-CoV-2/FLU/RSV testing.  Fact Sheet for Patients: EntrepreneurPulse.com.au  Fact Sheet for Healthcare Providers: IncredibleEmployment.be  This test is not yet approved or cleared by the [place] FDA and has been authorized for detection and/or diagnosis of SARS-CoV-2 by FDA under an Emergency Use Authorization (EUA). This EUA will remain in effect (meaning this test can be used)  for the duration of the COVID-19 declaration under Section 564(b)(1) of the Act, 21 U.S.C. section 360bbb-3(b)(1), unless the authorization is terminated or revoked.  Performed at [facility], [place] [address] [place], [place] [address]    POC Amphetamine UR 07/12/2021 None Detected  NONE DETECTED (Cut Off Level 1000 ng/mL) Final   POC Secobarbital (BAR) 07/12/2021 None Detected  NONE DETECTED (Cut Off Level 300 ng/mL) Final   POC Buprenorphine (BUP) 07/12/2021 None Detected  NONE DETECTED (Cut Off Level 10 ng/mL) Final   POC Oxazepam (BZO) 07/12/2021 Positive (A)  NONE DETECTED (Cut Off Level 300 ng/mL) Final   POC Cocaine UR 07/12/2021 None Detected  NONE DETECTED (Cut Off Level 300 ng/mL) Final   POC Methamphetamine UR 07/12/2021 None Detected  NONE DETECTED (Cut Off Level 1000 ng/mL) Final   POC Morphine 07/12/2021 None Detected  NONE DETECTED (Cut Off Level 300 ng/mL) Final   POC Methadone UR 07/12/2021 None Detected  NONE DETECTED (Cut Off Level 300 ng/mL) Final   POC Oxycodone UR 07/12/2021 None Detected  NONE DETECTED (Cut Off Level 100 ng/mL) Final   POC Marijuana UR 07/12/2021 None Detected  NONE DETECTED (Cut Off Level 50 ng/mL) Final   SARS Coronavirus 2 Ag 07/12/2021 Negative  Negative Preliminary   Preg Test, Ur 07/12/2021 NEGATIVE  NEGATIVE Final   Comment:        THE SENSITIVITY OF THIS METHODOLOGY IS >24 mIU/mL    SARSCOV2ONAVIRUS 2 AG 07/12/2021 NEGATIVE  NEGATIVE Final   Comment: (NOTE) SARS-CoV-2 antigen NOT DETECTED.   Negative results are presumptive.  Negative results do not preclude SARS-CoV-2 infection and should not be used as the sole basis for treatment or other patient management decisions, including infection  control decisions, particularly in the presence of clinical signs and  symptoms consistent with COVID-19, or in those who have been in contact with the virus.  Negative results must be combined with clinical observations, patient history,  and epidemiological information. The expected result is Negative.  Fact Sheet for Patients: HandmadeRecipes.com.cy  Fact Sheet for Healthcare Providers: FuneralLife.at  This test is not yet approved or cleared by the [place] FDA and  has been authorized for detection and/or diagnosis of SARS-CoV-2 by FDA under an Emergency Use Authorization (EUA).  This EUA will remain in effect (meaning this test can be used) for the duration of  the COV                          ID-19 declaration under Section 564(b)(1) of the Act, 21 U.S.C. section 360bbb-3(b)(1), unless the authorization is terminated or revoked sooner.    No results displayed because visit has over 200 results.    Admission on 06/26/2021, Discharged on 06/27/2021  Component Date Value Ref Range Status   Glucose-Capillary 06/26/2021 100 (H)  70 - 99 mg/dL Final   Glucose reference range applies only to samples taken after fasting for at least 8 hours.   Sodium 06/26/2021 138  135 - 145 mmol/L Final   Potassium 06/26/2021 4.1  3.5 - 5.1 mmol/L Final   Chloride 06/26/2021 109  98 - 111 mmol/L Final   CO2 06/26/2021 22  22 - 32 mmol/L Final   Glucose, Bld 06/26/2021 111 (H)  70 - 99 mg/dL Final   Glucose reference range applies only to samples taken after fasting for at least 8 hours.   BUN 06/26/2021 6  6 - 20 mg/dL Final   Creatinine, Ser 06/26/2021 0.74  0.44 - 1.00 mg/dL Final   Calcium 06/26/2021 9.6  8.9 - 10.3 mg/dL Final   Total Protein 06/26/2021 7.1  6.5 - 8.1 g/dL Final   Albumin 06/26/2021 4.1  3.5 - 5.0 g/dL Final   AST 06/26/2021 21  15 - 41 U/L Final   ALT 06/26/2021 20  0 - 44 U/L Final   Alkaline Phosphatase 06/26/2021 60  38 - 126 U/L Final   Total Bilirubin 06/26/2021 0.6  0.3 - 1.2 mg/dL Final   GFR, Estimated 06/26/2021 >60  >60 mL/min Final   Comment: (NOTE) Calculated using the CKD-EPI Creatinine Equation (2021)    Anion gap 06/26/2021 7  5 - 15  Final   Performed at [facility] [address], [place], [place] [address]   Salicylate Lvl 20/25/4270 <7.0 (L)  7.0 - 30.0 mg/dL Final   Performed at [facility] [address], [place], [place] [address]   Acetaminophen (Tylenol), Serum 06/26/2021 <10 (L)  10 - 30 ug/mL Final   Comment: (NOTE) Therapeutic concentrations vary significantly. A range of 10-30 ug/mL  may be an effective concentration for many patients. However, some  are best treated at concentrations outside of this range. Acetaminophen concentrations >150 ug/mL at 4 hours after ingestion  and >50 ug/mL at 12 hours after ingestion are often associated with  toxic reactions.  Performed at [facility], [place] [address] [place], [place] [address]    Alcohol, Ethyl (B) 06/26/2021 <10  <10 mg/dL Final   Comment: (NOTE) Lowest detectable limit for serum alcohol is 10 mg/dL.  For medical purposes only. Performed at [facility], [place] [address] [place], [place] [address]    Opiates 06/26/2021 NONE DETECTED  NONE DETECTED Final   Cocaine 06/26/2021 NONE DETECTED  NONE DETECTED Final   Benzodiazepines 06/26/2021 NONE DETECTED  NONE DETECTED Final   Amphetamines 06/26/2021 NONE DETECTED  NONE DETECTED Final   Tetrahydrocannabinol 06/26/2021 NONE DETECTED  NONE DETECTED Final   Barbiturates 06/26/2021 NONE DETECTED  NONE DETECTED Final   Comment: (NOTE) DRUG SCREEN FOR MEDICAL PURPOSES ONLY.  IF CONFIRMATION IS NEEDED FOR ANY PURPOSE, NOTIFY LAB WITHIN 5 DAYS.  LOWEST DETECTABLE LIMITS FOR URINE DRUG SCREEN Drug Class                     Cutoff (ng/mL) Amphetamine and metabolites    1000 Barbiturate and metabolites    200 Benzodiazepine                 073 Tricyclics and metabolites     300 Opiates and metabolites        300 Cocaine and metabolites        300 THC                            50 Performed at [facility], [place] [address], [place], [place] [address]    WBC 06/26/2021  9.4  4.0 - 10.5 K/uL Final  RBC 06/26/2021 4.89  3.87 - 5.11 MIL/uL Final   Hemoglobin 06/26/2021 12.9  12.0 - 15.0 g/dL Final   HCT 06/26/2021 40.1  36.0 - 46.0 % Final   MCV 06/26/2021 82.0  80.0 - 100.0 fL Final   MCH 06/26/2021 26.4  26.0 - 34.0 pg Final   MCHC 06/26/2021 32.2  30.0 - 36.0 g/dL Final   RDW 06/26/2021 15.5  11.5 - 15.5 % Final   Platelets 06/26/2021 298  150 - 400 K/uL Final   nRBC 06/26/2021 0.0  0.0 - 0.2 % Final   Neutrophils Relative % 06/26/2021 62  % Final   Neutro Abs 06/26/2021 5.8  1.7 - 7.7 K/uL Final   Lymphocytes Relative 06/26/2021 32  % Final   Lymphs Abs 06/26/2021 3.0  0.7 - 4.0 K/uL Final   Monocytes Relative 06/26/2021 5  % Final   Monocytes Absolute 06/26/2021 0.4  0.1 - 1.0 K/uL Final   Eosinophils Relative 06/26/2021 1  % Final   Eosinophils Absolute 06/26/2021 0.1  0.0 - 0.5 K/uL Final   Basophils Relative 06/26/2021 0  % Final   Basophils Absolute 06/26/2021 0.0  0.0 - 0.1 K/uL Final   Immature Granulocytes 06/26/2021 0  % Final   Abs Immature Granulocytes 06/26/2021 0.03  0.00 - 0.07 K/uL Final   Performed at [facility], [place] [address], [place], [place] [address]   I-stat hCG, quantitative 06/26/2021 <5.0  <5 mIU/mL Final   Comment 3 06/26/2021          Final   Comment:   GEST. AGE      CONC.  (mIU/mL)   <=1 WEEK        5 - 50     2 WEEKS       50 - 500     3 WEEKS       100 - 10,000     4 WEEKS     1,000 - 30,000        FEMALE AND NON-PREGNANT FEMALE:     LESS THAN 5 mIU/mL    Magnesium 06/26/2021 1.8  1.7 - 2.4 mg/dL Final   Performed at [facility], [place] [address] [place], [place] [address]   Acetaminophen (Tylenol), Serum 06/26/2021 <10 (L)  10 - 30 ug/mL Final   Comment: (NOTE) Therapeutic concentrations vary significantly. A range of 10-30 ug/mL  may be an effective concentration for many patients. However, some  are best treated at concentrations outside of this range. Acetaminophen concentrations >150 ug/mL at  4 hours after ingestion  and >50 ug/mL at 12 hours after ingestion are often associated with  toxic reactions.  Performed at [facility], [place] [address], [place], [place] [address]    WBC 06/26/2021 6.3  4.0 - 10.5 K/uL Final   RBC 06/26/2021 3.92  3.87 - 5.11 MIL/uL Final   Hemoglobin 06/26/2021 10.3 (L)  12.0 - 15.0 g/dL Final   HCT 06/26/2021 32.4 (L)  36.0 - 46.0 % Final   MCV 06/26/2021 82.7  80.0 - 100.0 fL Final   MCH 06/26/2021 26.3  26.0 - 34.0 pg Final   MCHC 06/26/2021 31.8  30.0 - 36.0 g/dL Final   RDW 06/26/2021 15.5  11.5 - 15.5 % Final   Platelets 06/26/2021 201  150 - 400 K/uL Final   nRBC 06/26/2021 0.0  0.0 - 0.2 % Final   Neutrophils Relative % 06/26/2021 56  % Final   Neutro Abs 06/26/2021  3.5  1.7 - 7.7 K/uL Final   Lymphocytes Relative 06/26/2021 38  % Final   Lymphs Abs 06/26/2021 2.4  0.7 - 4.0 K/uL Final   Monocytes Relative 06/26/2021 5  % Final   Monocytes Absolute 06/26/2021 0.3  0.1 - 1.0 K/uL Final   Eosinophils Relative 06/26/2021 1  % Final   Eosinophils Absolute 06/26/2021 0.1  0.0 - 0.5 K/uL Final   Basophils Relative 06/26/2021 0  % Final   Basophils Absolute 06/26/2021 0.0  0.0 - 0.1 K/uL Final   Immature Granulocytes 06/26/2021 0  % Final   Abs Immature Granulocytes 06/26/2021 0.01  0.00 - 0.07 K/uL Final   Performed at [place] [address], [place], [place] [address]   Sodium 06/26/2021 138  135 - 145 mmol/L Final   Potassium 06/26/2021 3.7  3.5 - 5.1 mmol/L Final   Chloride 06/26/2021 114 (H)  98 - 111 mmol/L Final   CO2 06/26/2021 22  22 - 32 mmol/L Final   Glucose, Bld 06/26/2021 99  70 - 99 mg/dL Final   Glucose reference range applies only to samples taken after fasting for at least 8 hours.   BUN 06/26/2021 5 (L)  6 - 20 mg/dL Final   Creatinine, Ser 06/26/2021 0.62  0.44 - 1.00 mg/dL Final   Calcium 06/26/2021 8.0 (L)  8.9 - 10.3 mg/dL Final   Total Protein 06/26/2021 5.6 (L)  6.5 - 8.1 g/dL Final   Albumin  06/26/2021 3.1 (L)  3.5 - 5.0 g/dL Final   AST 06/26/2021 15  15 - 41 U/L Final   ALT 06/26/2021 15  0 - 44 U/L Final   Alkaline Phosphatase 06/26/2021 46  38 - 126 U/L Final   Total Bilirubin 06/26/2021 0.5  0.3 - 1.2 mg/dL Final   GFR, Estimated 06/26/2021 >60  >60 mL/min Final   Comment: (NOTE) Calculated using the CKD-EPI Creatinine Equation (2021)    Anion gap 06/26/2021 2 (L)  5 - 15 Final   Comment: Electrolytes repeated to confirm. Performed at [facility], [place] [address] [place],  [address]   Admission on 06/23/2021, Discharged on 06/24/2021  Component Date Value Ref Range Status   SARS Coronavirus 2 by RT PCR 06/24/2021 NEGATIVE  NEGATIVE Final   Comment: (NOTE) SARS-CoV-2 target nucleic acids are NOT DETECTED.  The SARS-CoV-2 RNA is generally detectable in upper respiratory specimens during the acute phase of infection. The lowest concentration of SARS-CoV-2 viral copies this assay can detect is 138 copies/mL. A negative result does not preclude SARS-Cov-2 infection and should not be used as the sole basis for treatment or other patient management decisions. A negative result may occur with  improper specimen collection/handling, submission of specimen other than nasopharyngeal swab, presence of viral mutation(s) within the areas targeted by this assay, and inadequate number of viral copies(<138 copies/mL). A negative result must be combined with clinical observations, patient history, and epidemiological information. The expected result is Negative.  Fact Sheet for Patients:  EntrepreneurPulse.com.au  Fact Sheet for Healthcare Providers:  IncredibleEmployment.be  This test is no                          t yet approved or cleared by the [place] FDA and  has been authorized for detection and/or diagnosis of SARS-CoV-2 by FDA under an Emergency Use Authorization (EUA). This EUA will remain  in effect (meaning  this test can be used)  for the duration of the COVID-19 declaration under Section 564(b)(1) of the Act, 21 U.S.C.section 360bbb-3(b)(1), unless the authorization is terminated  or revoked sooner.       Influenza A by PCR 06/24/2021 NEGATIVE  NEGATIVE Final   Influenza B by PCR 06/24/2021 NEGATIVE  NEGATIVE Final   Comment: (NOTE) The Xpert Xpress SARS-CoV-2/FLU/RSV plus assay is intended as an aid in the diagnosis of influenza from Nasopharyngeal swab specimens and should not be used as a sole basis for treatment. Nasal washings and aspirates are unacceptable for Xpert Xpress SARS-CoV-2/FLU/RSV testing.  Fact Sheet for Patients: EntrepreneurPulse.com.au  Fact Sheet for Healthcare Providers: IncredibleEmployment.be  This test is not yet approved or cleared by the [place] FDA and has been authorized for detection and/or diagnosis of SARS-CoV-2 by FDA under an Emergency Use Authorization (EUA). This EUA will remain in effect (meaning this test can be used) for the duration of the COVID-19 declaration under Section 564(b)(1) of the Act, 21 U.S.C. section 360bbb-3(b)(1), unless the authorization is terminated or revoked.  Performed at [facility], [place] [address] [place], [place] [address]    POC Amphetamine UR 06/24/2021 None Detected  NONE DETECTED (Cut Off Level 1000 ng/mL) Final   POC Secobarbital (BAR) 06/24/2021 None Detected  NONE DETECTED (Cut Off Level 300 ng/mL) Final   POC Buprenorphine (BUP) 06/24/2021 None Detected  NONE DETECTED (Cut Off Level 10 ng/mL) Final   POC Oxazepam (BZO) 06/24/2021 Positive (A)  NONE DETECTED (Cut Off Level 300 ng/mL) Final   POC Cocaine UR 06/24/2021 None Detected  NONE DETECTED (Cut Off Level 300 ng/mL) Final   POC Methamphetamine UR 06/24/2021 None Detected  NONE DETECTED (Cut Off Level 1000 ng/mL) Final   POC Morphine 06/24/2021 None Detected  NONE DETECTED (Cut Off Level 300 ng/mL) Final    POC Methadone UR 06/24/2021 None Detected  NONE DETECTED (Cut Off Level 300 ng/mL) Final   POC Oxycodone UR 06/24/2021 None Detected  NONE DETECTED (Cut Off Level 100 ng/mL) Final   POC Marijuana UR 06/24/2021 None Detected  NONE DETECTED (Cut Off Level 50 ng/mL) Final   SARSCOV2ONAVIRUS 2 AG 06/24/2021 NEGATIVE  NEGATIVE Final   Comment: (NOTE) SARS-CoV-2 antigen NOT DETECTED.   Negative results are presumptive.  Negative results do not preclude SARS-CoV-2 infection and should not be used as the sole basis for treatment or other patient management decisions, including infection  control decisions, particularly in the presence of clinical signs and  symptoms consistent with COVID-19, or in those who have been in contact with the virus.  Negative results must be combined with clinical observations, patient history, and epidemiological information. The expected result is Negative.  Fact Sheet for Patients: HandmadeRecipes.com.cy  Fact Sheet for Healthcare Providers: FuneralLife.at  This test is not yet approved or cleared by the [place] FDA and  has been authorized for detection and/or diagnosis of SARS-CoV-2 by FDA under an Emergency Use Authorization (EUA).  This EUA will remain in effect (meaning this test can be used) for the duration of  the COV                          ID-19 declaration under Section 564(b)(1) of the Act, 21 U.S.C. section 360bbb-3(b)(1), unless the authorization is terminated or revoked sooner.     Preg Test, Ur 06/24/2021 NEGATIVE  NEGATIVE Final   Comment:        THE SENSITIVITY OF THIS METHODOLOGY IS >24 mIU/mL  Admission on 06/23/2021, Discharged on 06/23/2021  Component Date Value Ref Range Status   Preg Test, Ur 06/23/2021 NEGATIVE  NEGATIVE Final   Comment:        THE SENSITIVITY OF THIS METHODOLOGY IS >20 mIU/mL. Performed at [facility], [place] [address], [place],  [place] [address]   Admission on 06/04/2021, Discharged on 06/04/2021  Component Date Value Ref Range Status   Sodium 06/04/2021 138  135 - 145 mmol/L Final   Potassium 06/04/2021 3.4 (L)  3.5 - 5.1 mmol/L Final   Chloride 06/04/2021 108  98 - 111 mmol/L Final   CO2 06/04/2021 23  22 - 32 mmol/L Final   Glucose, Bld 06/04/2021 94  70 - 99 mg/dL Final   Glucose reference range applies only to samples taken after fasting for at least 8 hours.   BUN 06/04/2021 7  6 - 20 mg/dL Final   Creatinine, Ser 06/04/2021 0.73  0.44 - 1.00 mg/dL Final   Calcium 06/04/2021 9.1  8.9 - 10.3 mg/dL Final   Total Protein 06/04/2021 7.2  6.5 - 8.1 g/dL Final   Albumin 06/04/2021 4.0  3.5 - 5.0 g/dL Final   AST 06/04/2021 17  15 - 41 U/L Final   ALT 06/04/2021 14  0 - 44 U/L Final   Alkaline Phosphatase 06/04/2021 71  38 - 126 U/L Final   Total Bilirubin 06/04/2021 0.4  0.3 - 1.2 mg/dL Final   GFR, Estimated 06/04/2021 >60  >60 mL/min Final   Comment: (NOTE) Calculated using the CKD-EPI Creatinine Equation (2021)    Anion gap 06/04/2021 7  5 - 15 Final   Performed at [place] [address] [place], [place] [address]   Salicylate Lvl 86/76/7209 <7.0 (L)  7.0 - 30.0 mg/dL Final   Performed at [place] [address], [place], [place] [address]   Acetaminophen (Tylenol), Serum 06/04/2021 <10 (L)  10 - 30 ug/mL Final   Comment: (NOTE) Therapeutic concentrations vary significantly. A range of 10-30 ug/mL  may be an effective concentration for many patients. However, some  are best treated at concentrations outside of this range. Acetaminophen concentrations >150 ug/mL at 4 hours after ingestion  and >50 ug/mL at 12 hours after ingestion are often associated with  toxic reactions.  Performed at [facility], [place] [address] [place], [place] [address]    Alcohol, Ethyl (B) 06/04/2021 <10  <10 mg/dL Final   Comment: (NOTE) Lowest detectable limit for serum alcohol is 10 mg/dL.  For  medical purposes only. Performed at [facility], [place] [address] [place], [place] [address]    Opiates 06/04/2021 NONE DETECTED  NONE DETECTED Final   Cocaine 06/04/2021 NONE DETECTED  NONE DETECTED Final   Benzodiazepines 06/04/2021 NONE DETECTED  NONE DETECTED Final   Amphetamines 06/04/2021 NONE DETECTED  NONE DETECTED Final   Tetrahydrocannabinol 06/04/2021 NONE DETECTED  NONE DETECTED Final   Barbiturates 06/04/2021 NONE DETECTED  NONE DETECTED Final   Comment: (NOTE) DRUG SCREEN FOR MEDICAL PURPOSES ONLY.  IF CONFIRMATION IS NEEDED FOR ANY PURPOSE, NOTIFY LAB WITHIN 5 DAYS.  LOWEST DETECTABLE LIMITS FOR URINE DRUG SCREEN Drug Class                     Cutoff (ng/mL) Amphetamine and metabolites    1000 Barbiturate and metabolites    200 Benzodiazepine  128 Tricyclics and metabolites     300 Opiates and metabolites        300 Cocaine and metabolites        300 THC                            50 Performed at [facility], [place] [address], [place], [place] [address]    WBC 06/04/2021 8.2  4.0 - 10.5 K/uL Final   RBC 06/04/2021 4.73  3.87 - 5.11 MIL/uL Final   Hemoglobin 06/04/2021 12.2  12.0 - 15.0 g/dL Final   HCT 06/04/2021 38.2  36.0 - 46.0 % Final   MCV 06/04/2021 80.8  80.0 - 100.0 fL Final   MCH 06/04/2021 25.8 (L)  26.0 - 34.0 pg Final   MCHC 06/04/2021 31.9  30.0 - 36.0 g/dL Final   RDW 06/04/2021 13.9  11.5 - 15.5 % Final   Platelets 06/04/2021 344  150 - 400 K/uL Final   nRBC 06/04/2021 0.0  0.0 - 0.2 % Final   Neutrophils Relative % 06/04/2021 53  % Final   Neutro Abs 06/04/2021 4.3  1.7 - 7.7 K/uL Final   Lymphocytes Relative 06/04/2021 38  % Final   Lymphs Abs 06/04/2021 3.1  0.7 - 4.0 K/uL Final   Monocytes Relative 06/04/2021 7  % Final   Monocytes Absolute 06/04/2021 0.6  0.1 - 1.0 K/uL Final   Eosinophils Relative 06/04/2021 1  % Final   Eosinophils Absolute 06/04/2021 0.1  0.0 - 0.5 K/uL Final   Basophils Relative 06/04/2021  1  % Final   Basophils Absolute 06/04/2021 0.1  0.0 - 0.1 K/uL Final   Immature Granulocytes 06/04/2021 0  % Final   Abs Immature Granulocytes 06/04/2021 0.03  0.00 - 0.07 K/uL Final   Performed at [facility], [place] [address] [place], [place] [address]   I-stat hCG, quantitative 06/04/2021 <5.0  <5 mIU/mL Final   Comment 3 06/04/2021          Final   Comment:   GEST. AGE      CONC.  (mIU/mL)   <=1 WEEK        5 - 50     2 WEEKS       50 - 500     3 WEEKS       100 - 10,000     4 WEEKS     1,000 - 30,000        FEMALE AND NON-PREGNANT FEMALE:     LESS THAN 5 mIU/mL    SARS Coronavirus 2 by RT PCR 06/04/2021 NEGATIVE  NEGATIVE Final   Comment: (NOTE) SARS-CoV-2 target nucleic acids are NOT DETECTED.  The SARS-CoV-2 RNA is generally detectable in upper respiratory specimens during the acute phase of infection. The lowest concentration of SARS-CoV-2 viral copies this assay can detect is 138 copies/mL. A negative result does not preclude SARS-Cov-2 infection and should not be used as the sole basis for treatment or other patient management decisions. A negative result may occur with  improper specimen collection/handling, submission of specimen other than nasopharyngeal swab, presence of viral mutation(s) within the areas targeted by this assay, and inadequate number of viral copies(<138 copies/mL). A negative result must be combined with clinical observations, patient history, and epidemiological information. The expected result is Negative.  Fact Sheet for Patients:  EntrepreneurPulse.com.au  Fact Sheet for Healthcare Providers:  IncredibleEmployment.be  This test is no  t yet approved or cleared by the [place] and  has been authorized for detection and/or diagnosis of SARS-CoV-2 by FDA under an Emergency Use Authorization (EUA). This EUA will remain  in effect (meaning this test can be used) for the  duration of the COVID-19 declaration under Section 564(b)(1) of the Act, 21 U.S.C.section 360bbb-3(b)(1), unless the authorization is terminated  or revoked sooner.       Influenza A by PCR 06/04/2021 NEGATIVE  NEGATIVE Final   Influenza B by PCR 06/04/2021 NEGATIVE  NEGATIVE Final   Comment: (NOTE) The Xpert Xpress SARS-CoV-2/FLU/RSV plus assay is intended as an aid in the diagnosis of influenza from Nasopharyngeal swab specimens and should not be used as a sole basis for treatment. Nasal washings and aspirates are unacceptable for Xpert Xpress SARS-CoV-2/FLU/RSV testing.  Fact Sheet for Patients: EntrepreneurPulse.com.au  Fact Sheet for Healthcare Providers: IncredibleEmployment.be  This test is not yet approved or cleared by the [place] FDA and has been authorized for detection and/or diagnosis of SARS-CoV-2 by FDA under an Emergency Use Authorization (EUA). This EUA will remain in effect (meaning this test can be used) for the duration of the COVID-19 declaration under Section 564(b)(1) of the Act, 21 U.S.C. section 360bbb-3(b)(1), unless the authorization is terminated or revoked.  Performed at [facility], [place] [address], [place], [place] [address]   Admission on 05/29/2021, Discharged on 05/30/2021  Component Date Value Ref Range Status   SARS Coronavirus 2 by RT PCR 05/29/2021 NEGATIVE  NEGATIVE Final   Comment: (NOTE) SARS-CoV-2 target nucleic acids are NOT DETECTED.  The SARS-CoV-2 RNA is generally detectable in upper respiratory specimens during the acute phase of infection. The lowest concentration of SARS-CoV-2 viral copies this assay can detect is 138 copies/mL. A negative result does not preclude SARS-Cov-2 infection and should not be used as the sole basis for treatment or other patient management decisions. A negative result may occur with  improper specimen collection/handling, submission of specimen  other than nasopharyngeal swab, presence of viral mutation(s) within the areas targeted by this assay, and inadequate number of viral copies(<138 copies/mL). A negative result must be combined with clinical observations, patient history, and epidemiological information. The expected result is Negative.  Fact Sheet for Patients:  EntrepreneurPulse.com.au  Fact Sheet for Healthcare Providers:  IncredibleEmployment.be  This test is no                          t yet approved or cleared by the [place] FDA and  has been authorized for detection and/or diagnosis of SARS-CoV-2 by FDA under an Emergency Use Authorization (EUA). This EUA will remain  in effect (meaning this test can be used) for the duration of the COVID-19 declaration under Section 564(b)(1) of the Act, 21 U.S.C.section 360bbb-3(b)(1), unless the authorization is terminated  or revoked sooner.       Influenza A by PCR 05/29/2021 NEGATIVE  NEGATIVE Final   Influenza B by PCR 05/29/2021 NEGATIVE  NEGATIVE Final   Comment: (NOTE) The Xpert Xpress SARS-CoV-2/FLU/RSV plus assay is intended as an aid in the diagnosis of influenza from Nasopharyngeal swab specimens and should not be used as a sole basis for treatment. Nasal washings and aspirates are unacceptable for Xpert Xpress SARS-CoV-2/FLU/RSV testing.  Fact Sheet for Patients: EntrepreneurPulse.com.au  Fact Sheet for Healthcare Providers: IncredibleEmployment.be  This test is not yet approved or cleared by the [place] and has been authorized for  detection and/or diagnosis of SARS-CoV-2 by FDA under an Emergency Use Authorization (EUA). This EUA will remain in effect (meaning this test can be used) for the duration of the COVID-19 declaration under Section 564(b)(1) of the Act, 21 U.S.C. section 360bbb-3(b)(1), unless the authorization is terminated or revoked.  Performed at  [facility], [place] [address], [place], [place] [address]    Color, Urine 05/29/2021 STRAW (A)  YELLOW Final   APPearance 05/29/2021 CLEAR  CLEAR Final   Specific Gravity, Urine 05/29/2021 1.010  1.005 - 1.030 Final   pH 05/29/2021 6.0  5.0 - 8.0 Final   Glucose, UA 05/29/2021 NEGATIVE  NEGATIVE mg/dL Final   Hgb urine dipstick 05/29/2021 NEGATIVE  NEGATIVE Final   Bilirubin Urine 05/29/2021 NEGATIVE  NEGATIVE Final   Ketones, ur 05/29/2021 NEGATIVE  NEGATIVE mg/dL Final   Protein, ur 05/29/2021 NEGATIVE  NEGATIVE mg/dL Final   Nitrite 05/29/2021 NEGATIVE  NEGATIVE Final   Leukocytes,Ua 05/29/2021 NEGATIVE  NEGATIVE Final   RBC / HPF 05/29/2021 0-5  0 - 5 RBC/hpf Final   WBC, UA 05/29/2021 0-5  0 - 5 WBC/hpf Final   Bacteria, UA 05/29/2021 NONE SEEN  NONE SEEN Final   Squamous Epithelial / LPF 05/29/2021 0-5  0 - 5 Final   Mucus 05/29/2021 PRESENT   Final   Performed at [facility], [place] [address], [place], [place] [address]   WBC 05/29/2021 8.7  4.0 - 10.5 K/uL Final   RBC 05/29/2021 4.56  3.87 - 5.11 MIL/uL Final   Hemoglobin 05/29/2021 12.1  12.0 - 15.0 g/dL Final   HCT 05/29/2021 37.8  36.0 - 46.0 % Final   MCV 05/29/2021 82.9  80.0 - 100.0 fL Final   MCH 05/29/2021 26.5  26.0 - 34.0 pg Final   MCHC 05/29/2021 32.0  30.0 - 36.0 g/dL Final   RDW 05/29/2021 14.6  11.5 - 15.5 % Final   Platelets 05/29/2021 205  150 - 400 K/uL Final   nRBC 05/29/2021 0.0  0.0 - 0.2 % Final   Performed at [place] [address] [place], [place] [address]   Sodium 05/29/2021 138  135 - 145 mmol/L Final   Potassium 05/29/2021 3.7  3.5 - 5.1 mmol/L Final   Chloride 05/29/2021 108  98 - 111 mmol/L Final   CO2 05/29/2021 22  22 - 32 mmol/L Final   Glucose, Bld 05/29/2021 121 (H)  70 - 99 mg/dL Final   Glucose reference range applies only to samples taken after fasting for at least 8 hours.   BUN 05/29/2021 8  6 - 20 mg/dL Final   Creatinine, Ser 05/29/2021 0.66  0.44 - 1.00  mg/dL Final   Calcium 05/29/2021 9.3  8.9 - 10.3 mg/dL Final   GFR, Estimated 05/29/2021 >60  >60 mL/min Final   Comment: (NOTE) Calculated using the CKD-EPI Creatinine Equation (2021)    Anion gap 05/29/2021 8  5 - 15 Final   Performed at [facility], [place] [address] [place], [place] [address]   I-stat hCG, quantitative 05/30/2021 <5.0  <5 mIU/mL Final   Comment 3 05/30/2021          Final   Comment:   GEST. AGE      CONC.  (mIU/mL)   <=1 WEEK        5 - 50     2 WEEKS       50 - 500     3 WEEKS  100 - 10,000     4 WEEKS     1,000 - 30,000        FEMALE AND NON-PREGNANT FEMALE:     LESS THAN 5 mIU/mL   Admission on 05/01/2021, Discharged on 05/01/2021  Component Date Value Ref Range Status   SARS Coronavirus 2 by RT PCR 05/01/2021 NEGATIVE  NEGATIVE Final   Comment: (NOTE) SARS-CoV-2 target nucleic acids are NOT DETECTED.  The SARS-CoV-2 RNA is generally detectable in upper respiratory specimens during the acute phase of infection. The lowest concentration of SARS-CoV-2 viral copies this assay can detect is 138 copies/mL. A negative result does not preclude SARS-Cov-2 infection and should not be used as the sole basis for treatment or other patient management decisions. A negative result may occur with  improper specimen collection/handling, submission of specimen other than nasopharyngeal swab, presence of viral mutation(s) within the areas targeted by this assay, and inadequate number of viral copies(<138 copies/mL). A negative result must be combined with clinical observations, patient history, and epidemiological information. The expected result is Negative.  Fact Sheet for Patients:  EntrepreneurPulse.com.au  Fact Sheet for Healthcare Providers:  IncredibleEmployment.be  This test is no                          t yet approved or cleared by the [place] FDA and  has been authorized for detection and/or diagnosis of  SARS-CoV-2 by FDA under an Emergency Use Authorization (EUA). This EUA will remain  in effect (meaning this test can be used) for the duration of the COVID-19 declaration under Section 564(b)(1) of the Act, 21 U.S.C.section 360bbb-3(b)(1), unless the authorization is terminated  or revoked sooner.       Influenza A by PCR 05/01/2021 NEGATIVE  NEGATIVE Final   Influenza B by PCR 05/01/2021 NEGATIVE  NEGATIVE Final   Comment: (NOTE) The Xpert Xpress SARS-CoV-2/FLU/RSV plus assay is intended as an aid in the diagnosis of influenza from Nasopharyngeal swab specimens and should not be used as a sole basis for treatment. Nasal washings and aspirates are unacceptable for Xpert Xpress SARS-CoV-2/FLU/RSV testing.  Fact Sheet for Patients: EntrepreneurPulse.com.au  Fact Sheet for Healthcare Providers: IncredibleEmployment.be  This test is not yet approved or cleared by the [place] FDA and has been authorized for detection and/or diagnosis of SARS-CoV-2 by FDA under an Emergency Use Authorization (EUA). This EUA will remain in effect (meaning this test can be used) for the duration of the COVID-19 declaration under Section 564(b)(1) of the Act, 21 U.S.C. section 360bbb-3(b)(1), unless the authorization is terminated or revoked.  Performed at [facility], [place] [address], [place], [place] [address]    WBC 05/01/2021 12.7 (H)  4.0 - 10.5 K/uL Final   RBC 05/01/2021 5.28 (H)  3.87 - 5.11 MIL/uL Final   Hemoglobin 05/01/2021 13.9  12.0 - 15.0 g/dL Final   HCT 05/01/2021 41.9  36.0 - 46.0 % Final   MCV 05/01/2021 79.4 (L)  80.0 - 100.0 fL Final   MCH 05/01/2021 26.3  26.0 - 34.0 pg Final   MCHC 05/01/2021 33.2  30.0 - 36.0 g/dL Final   RDW 05/01/2021 13.4  11.5 - 15.5 % Final   Platelets 05/01/2021 351  150 - 400 K/uL Final   nRBC 05/01/2021 0.0  0.0 - 0.2 % Final   Neutrophils Relative % 05/01/2021 66  % Final   Neutro Abs 05/01/2021 8.4  (H)  1.7 - 7.7 K/uL  Final   Lymphocytes Relative 05/01/2021 27  % Final   Lymphs Abs 05/01/2021 3.4  0.7 - 4.0 K/uL Final   Monocytes Relative 05/01/2021 5  % Final   Monocytes Absolute 05/01/2021 0.7  0.1 - 1.0 K/uL Final   Eosinophils Relative 05/01/2021 1  % Final   Eosinophils Absolute 05/01/2021 0.1  0.0 - 0.5 K/uL Final   Basophils Relative 05/01/2021 0  % Final   Basophils Absolute 05/01/2021 0.1  0.0 - 0.1 K/uL Final   Immature Granulocytes 05/01/2021 1  % Final   Abs Immature Granulocytes 05/01/2021 0.07  0.00 - 0.07 K/uL Final   Performed at [place] [address] [place], [place] [address]   Sodium 05/01/2021 137  135 - 145 mmol/L Final   Potassium 05/01/2021 3.7  3.5 - 5.1 mmol/L Final   Chloride 05/01/2021 107  98 - 111 mmol/L Final   CO2 05/01/2021 21 (L)  22 - 32 mmol/L Final   Glucose, Bld 05/01/2021 96  70 - 99 mg/dL Final   Glucose reference range applies only to samples taken after fasting for at least 8 hours.   BUN 05/01/2021 9  6 - 20 mg/dL Final   Creatinine, Ser 05/01/2021 0.73  0.44 - 1.00 mg/dL Final   Calcium 05/01/2021 9.5  8.9 - 10.3 mg/dL Final   Total Protein 05/01/2021 7.8  6.5 - 8.1 g/dL Final   Albumin 05/01/2021 4.4  3.5 - 5.0 g/dL Final   AST 05/01/2021 18  15 - 41 U/L Final   ALT 05/01/2021 24  0 - 44 U/L Final   Alkaline Phosphatase 05/01/2021 70  38 - 126 U/L Final   Total Bilirubin 05/01/2021 0.6  0.3 - 1.2 mg/dL Final   GFR, Estimated 05/01/2021 >60  >60 mL/min Final   Comment: (NOTE) Calculated using the CKD-EPI Creatinine Equation (2021)    Anion gap 05/01/2021 9  5 - 15 Final   Performed at [facility] [address] [place], [place] [address]   Alcohol, Ethyl (B) 05/01/2021 <10  <10 mg/dL Final   Comment: (NOTE) Lowest detectable limit for serum alcohol is 10 mg/dL.  For medical purposes only. Performed at [facility], [place] [address], [place], [place] [address]    Cholesterol 05/01/2021 245 (H)  0 - 200  mg/dL Final   Triglycerides 05/01/2021 169 (H)  <150 mg/dL Final   HDL 05/01/2021 46  >40 mg/dL Final   Total CHOL/HDL Ratio 05/01/2021 5.3  RATIO Final   VLDL 05/01/2021 34  0 - 40 mg/dL Final   LDL Cholesterol 05/01/2021 165 (H)  0 - 99 mg/dL Final   Comment:        Total Cholesterol/HDL:CHD Risk Coronary Heart Disease Risk Table                     Men   Women  1/2 Average Risk   3.4   3.3  Average Risk       5.0   4.4  2 X Average Risk   9.6   7.1  3 X Average Risk  23.4   11.0        Use the calculated Patient Ratio above and the CHD Risk Table to determine the patient's CHD Risk.        ATP III CLASSIFICATION (LDL):  <100     mg/dL   Optimal  100-129  mg/dL   Near or Above  Optimal  130-159  mg/dL   Borderline  160-189  mg/dL   High  >190     mg/dL   Very High Performed at [facility] [address] [place], [place] [address]    TSH 05/01/2021 2.572  0.350 - 4.500 uIU/mL Final   Comment: Performed by a 3rd Generation assay with a functional sensitivity of <=0.01 uIU/mL. Performed at [facility], [place] [address], [place], [place] [address]    SARS Coronavirus 2 Ag 05/01/2021 Negative (NE)  Negative Final   SARSCOV2ONAVIRUS 2 AG 05/01/2021 NEGATIVE  NEGATIVE Final   Comment: (NOTE) SARS-CoV-2 antigen NOT DETECTED.   Negative results are presumptive.  Negative results do not preclude SARS-CoV-2 infection and should not be used as the sole basis for treatment or other patient management decisions, including infection  control decisions, particularly in the presence of clinical signs and  symptoms consistent with COVID-19, or in those who have been in contact with the virus.  Negative results must be combined with clinical observations, patient history, and epidemiological information. The expected result is Negative.  Fact Sheet for Patients: HandmadeRecipes.com.cy  Fact Sheet for Healthcare  Providers: FuneralLife.at  This test is not yet approved or cleared by the [place] FDA and  has been authorized for detection and/or diagnosis of SARS-CoV-2 by FDA under an Emergency Use Authorization (EUA).  This EUA will remain in effect (meaning this test can be used) for the duration of  the COV                          ID-19 declaration under Section 564(b)(1) of the Act, 21 U.S.C. section 360bbb-3(b)(1), unless the authorization is terminated or revoked sooner.    Admission on 04/21/2021, Discharged on 04/26/2021  Component Date Value Ref Range Status   Color, Urine 04/22/2021 YELLOW  YELLOW Final   APPearance 04/22/2021 HAZY (A)  CLEAR Final   Specific Gravity, Urine 04/22/2021 1.016  1.005 - 1.030 Final   pH 04/22/2021 7.0  5.0 - 8.0 Final   Glucose, UA 04/22/2021 NEGATIVE  NEGATIVE mg/dL Final   Hgb urine dipstick 04/22/2021 NEGATIVE  NEGATIVE Final   Bilirubin Urine 04/22/2021 NEGATIVE  NEGATIVE Final   Ketones, ur 04/22/2021 NEGATIVE  NEGATIVE mg/dL Final   Protein, ur 04/22/2021 NEGATIVE  NEGATIVE mg/dL Final   Nitrite 04/22/2021 NEGATIVE  NEGATIVE Final   Leukocytes,Ua 04/22/2021 NEGATIVE  NEGATIVE Final   Performed at [facility] [address], [place], [place] [address]   Cholesterol 04/24/2021 190  0 - 200 mg/dL Final   Triglycerides 04/24/2021 220 (H)  <150 mg/dL Final   HDL 04/24/2021 44  >40 mg/dL Final   Total CHOL/HDL Ratio 04/24/2021 4.3  RATIO Final   VLDL 04/24/2021 44 (H)  0 - 40 mg/dL Final   LDL Cholesterol 04/24/2021 102 (H)  0 - 99 mg/dL Final   Comment:        Total Cholesterol/HDL:CHD Risk Coronary Heart Disease Risk Table                     Men   Women  1/2 Average Risk   3.4   3.3  Average Risk       5.0   4.4  2 X Average Risk   9.6   7.1  3 X Average Risk  23.4   11.0        Use the calculated Patient Ratio  above and the CHD Risk Table to determine the patient's CHD Risk.         ATP III CLASSIFICATION (LDL):  <100     mg/dL   Optimal  100-129  mg/dL   Near or Above                    Optimal  130-159  mg/dL   Borderline  160-189  mg/dL   High  >190     mg/dL   Very High Performed at [facility] [address] [place],  [address]   There may be more visits with results that are not included.    Allergies: Bee venom, Coconut flavor, Geodon [ziprasidone hcl], Haloperidol and related, Lithium, Oxycodone, Quetiapine, Shellfish allergy, Phenergan [promethazine hcl], Prilosec [omeprazole], Sulfa antibiotics, Tegretol [carbamazepine], Prozac [fluoxetine], Tape, and Tylenol [acetaminophen]  PTA Medications: (Not in a hospital admission)   Medical Decision Making  [name] is a 32 y/o female with a history of bipolar disorder, intellectual disability, autism, depression, and borderline personality disorder presenting to [facility] via [facility] on 07/15/21.  Aarvi has been experiencing AVH.  Recommendations  Based on my evaluation the patient does not appear to have an emergency medical condition.  Lucia Bitter, NP 07/16/21  2:02 AM

## 2021-07-16 DIAGNOSIS — F603 Borderline personality disorder: Secondary | ICD-10-CM | POA: Diagnosis not present

## 2021-07-16 DIAGNOSIS — F315 Bipolar disorder, current episode depressed, severe, with psychotic features: Secondary | ICD-10-CM | POA: Diagnosis not present

## 2021-07-16 DIAGNOSIS — F333 Major depressive disorder, recurrent, severe with psychotic symptoms: Secondary | ICD-10-CM

## 2021-07-16 DIAGNOSIS — R45851 Suicidal ideations: Secondary | ICD-10-CM | POA: Diagnosis not present

## 2021-07-16 DIAGNOSIS — F411 Generalized anxiety disorder: Secondary | ICD-10-CM | POA: Diagnosis not present

## 2021-07-16 LAB — RESP PANEL BY RT-PCR (FLU A&B, COVID) ARPGX2
Influenza A by PCR: NEGATIVE
Influenza B by PCR: NEGATIVE
SARS Coronavirus 2 by RT PCR: NEGATIVE

## 2021-07-16 MED ORDER — ADULT MULTIVITAMIN W/MINERALS CH
1.0000 | ORAL_TABLET | Freq: Every day | ORAL | Status: DC
Start: 2021-07-16 — End: 2021-07-17
  Administered 2021-07-16 – 2021-07-17 (×2): 1 via ORAL
  Filled 2021-07-16 (×2): qty 1

## 2021-07-16 MED ORDER — OLANZAPINE 10 MG PO TBDP: 10.0000 mg | ORAL_TABLET | Freq: Two times a day (BID) | ORAL | Status: DC | PRN

## 2021-07-16 MED ORDER — HYDROXYZINE HCL 25 MG PO TABS: 25.0000 mg | ORAL_TABLET | Freq: Four times a day (QID) | ORAL | Status: DC | PRN

## 2021-07-16 MED ORDER — MOMETASONE FURO-FORMOTEROL FUM 200-5 MCG/ACT IN AERO
2.0000 | INHALATION_SPRAY | Freq: Two times a day (BID) | RESPIRATORY_TRACT | Status: DC
  Administered 2021-07-16 – 2021-07-17 (×2): 2 via RESPIRATORY_TRACT
  Filled 2021-07-16: qty 8.8

## 2021-07-16 MED ORDER — LOPERAMIDE HCL 2 MG PO CAPS: 2.0000 mg | ORAL_CAPSULE | ORAL | Status: DC | PRN

## 2021-07-16 MED ORDER — OLANZAPINE 10 MG IM SOLR: 10.0000 mg | Freq: Two times a day (BID) | INTRAMUSCULAR | Status: DC | PRN

## 2021-07-16 MED ORDER — HYDROXYZINE HCL 25 MG PO TABS
25.0000 mg | ORAL_TABLET | Freq: Three times a day (TID) | ORAL | Status: DC | PRN
  Administered 2021-07-16 (×2): 25 mg via ORAL
  Filled 2021-07-16 (×2): qty 1

## 2021-07-16 MED ORDER — THIAMINE HCL 100 MG PO TABS
100.0000 mg | ORAL_TABLET | Freq: Every day | ORAL | Status: DC
  Administered 2021-07-17: 100 mg via ORAL
  Filled 2021-07-16: qty 1

## 2021-07-16 MED ORDER — ONDANSETRON 4 MG PO TBDP
4.0000 mg | ORAL_TABLET | Freq: Four times a day (QID) | ORAL | Status: DC | PRN
Start: 2021-07-16 — End: 2021-07-17

## 2021-07-16 MED ORDER — TRAZODONE HCL 50 MG PO TABS: 50.0000 mg | ORAL_TABLET | Freq: Every evening | ORAL | Status: DC | PRN

## 2021-07-16 MED ORDER — LORAZEPAM 1 MG PO TABS: 1.0000 mg | ORAL_TABLET | Freq: Four times a day (QID) | ORAL | Status: DC | PRN

## 2021-07-16 MED ORDER — IBUPROFEN 600 MG PO TABS
600.0000 mg | ORAL_TABLET | Freq: Once | ORAL | Status: AC
  Administered 2021-07-16: 600 mg via ORAL
  Filled 2021-07-16: qty 1

## 2021-07-16 NOTE — Progress Notes (Signed)
Patient woke up went to the restroom and slammed the restroom door. After utilizing the restroom patient went back to chair bed and laid down. Report was given to RN patient seems to be irritable, not wanting to cooperate with vitals this morning. Nursing staff will continue to monitor.

## 2021-07-16 NOTE — ED Notes (Signed)
Pt sleeping at present, no distress noted.  Monitoring for safety. 

## 2021-07-16 NOTE — Progress Notes (Signed)
Patient yelling at staff, " Fuck all of you bitches let me out of here, I am ready to go home, why the fuck you wont let me just go home."  Patient eventually became tired of yelling and now laying on top of bed. Nursing staff will continue to monitor.

## 2021-07-16 NOTE — ED Provider Notes (Cosign Needed Addendum)
Behavioral Health Progress Note  Date and Time: 07/16/2021 4:07 PM Name: [name] MRN:  [id number]   HPI: [name] is a 31 y.o. female  with a reported past psychiatric history multiple suicide attempts, bipolar I disorder with psychotic features-currently in depression, reported borderline personality, ASD, presented voluntarily via EMS to [facility] (07/15/2021) for suicidal thoughts of sticking a knife in her throat after her current partner, dad, and therapist did not answer her phone call.  Suicidal thoughts persist for a few hours, then patient called 911 to take her to [facility].  Of note, patient had recent suicide attempt by overdosing on home meds (2 weeks WI:[id number], vryalar '3mg'$  once daily, gabapentin 200 mg tid, lexapro 10 mg once daily, and protonix) in May 2023 with resulting psychiatric hospitalization at [facility] (07/04/2021-07/11/2021). 07/12/2021 patient presented to [facility] for suicidal thoughts with paln and AVH and was accepted to [facility] (07/14/2021) for inpatient psychiatric hospitalization. However upon learning she was to be transferred to [facility] at [place], patient denied SI/AVH requested to be discharged so she can go to an appointment that morning with her CST psychiatrist at 10:30am.  Interval history: Overnight events per chart review and RN: Patient refused vitals x3 and blood draws.   On my evaluation, patient was initially resting comfortably and awoken easily. Stated that she slept throughout the night. Reported stable appetite. Stated that she feels "depressed".  "I just want the voices to go away", where patient described hearing the voice of lucifer telling her she's ugly. She denied SI initially and demanded to be discharged home. After about an hour, patient became restless where she would slam on the doors, broke the fire alarm, and demanding to go home. Later encounter that day she reported SI and stated that she wants to go to [facility].  Stated that she hates [facility].  She denied HI and VH. Did not appear internally preoccupied.   After re-directing patient, she stated that she woke up around 12 AM on 07/15/2021, and was having a good day at home looking until around 3 or 4pm where she attempted to call "ex" (who she currently resides with), dad, and therapist and no one answered the phone. Patient stated she was overwhelmed with feelings of loneliness and had suicidal thoughts of her stabbing herself in the neck with a kitchen knife at home. Patient denied intent, but stated that the SI was recurrent until later that evening, so she called 911.     Diagnosis:  Final diagnoses:  Borderline personality disorder ([place])  Suicidal ideation  GAD (generalized anxiety disorder)  Severe episode of recurrent major depressive disorder, with psychotic features ([place])    Total Time spent with patient: 45 minutes  Past Psychiatric History: see H&P  Outpatient psychiatry and therapy at [facility] in [place], [place]. PCP at 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    Chronic constipation    Depression    Drug-seeking behavior    Essential tremor    Headache    Overdose of acetaminophen 07/2017   and other meds   Personality disorder ([place])    Schizo-affective psychosis ([place])    Schizoaffective disorder, bipolar type ([place])    Seizures ([place])    Last  seizure December 2017   Sleep apnea     Past Surgical History:  Procedure Laterality Date   MOUTH SURGERY  2009 or 2010   Family History:  Family History  Problem Relation Age of Onset   Mental illness Father    Asthma Father    PDD Brother    Seizures Brother    Family Psychiatric  History: See H&P Social History:  Social History   Substance and Sexual Activity  Alcohol Use No    Alcohol/week: 1.0 standard drink of alcohol   Types: 1 Standard drinks or equivalent per week   Comment: denies at this time     Social History   Substance and Sexual Activity  Drug Use No   Comment: History of cocaine use at age 67 for 4 months    Social History   Socioeconomic History   Marital status: Widowed    Spouse name: Not on file   Number of children: 0   Years of education: Not on file   Highest education level: Not on file  Occupational History   Occupation: disability  Tobacco Use   Smoking status: Former    Packs/day: 0.00    Types: Cigarettes   Smokeless tobacco: Never   Tobacco comments:    Smoked for 2  years age [age]  Vaping Use   Vaping Use: Never used  Substance and Sexual Activity   Alcohol use: No    Alcohol/week: 1.0 standard drink of alcohol    Types: 1 Standard drinks or equivalent per week    Comment: denies at this time   Drug use: No    Comment: History of cocaine use at age 50 for 4 months   Sexual activity: Not Currently    Birth control/protection: None  Other Topics Concern   Not on file  Social History Narrative   Marital status: Widowed      Children: daughter      Lives: with boyfriend, in two story home      Employment:  Disability      Tobacco: quit smoking; smoked for two years.      Alcohol ;none      Drugs: none   Has not traveled outside of the country.   Right handed         Social Determinants of Health   Financial Resource Strain: Not on file  Food Insecurity: Not on file  Transportation Needs: Not on file  Physical Activity: Not on file  Stress: Not on file  Social Connections: Not on file   SDOH:  SDOH Screenings   Alcohol Screen: Low Risk  (07/05/2021)   Alcohol Screen    Last Alcohol Screening Score (AUDIT): 0  Depression (PHQ2-9): Medium Risk (06/27/2021)   Depression (PHQ2-9)    PHQ-2 Score: 9  Financial Resource Strain: Not on file  Food Insecurity: Not on file  Housing: Not on file  Physical  Activity: Not on file  Social Connections: Not on file  Stress: Not on file  Tobacco Use: Medium Risk (07/05/2021)   Patient History    Smoking Tobacco Use: Former    Smokeless Tobacco Use: Never    Passive Exposure: Not on file  Transportation Needs: Not on file   Additional Social History:    Pain Medications: See MAR Prescriptions: See MAR Over the Counter: See MAR History of alcohol / drug use?: No history of alcohol / drug abuse  Sleep: Fair  Appetite:  Fair  Current Medications:  Current Facility-Administered Medications  Medication Dose Route Frequency Provider Last Rate Last Admin   albuterol (VENTOLIN HFA) 108 (90 Base) MCG/ACT inhaler 2 puff  2 puff Inhalation Q6H PRN Bobbitt, Shalon E, NP       alum & mag hydroxide-simeth (MAALOX/MYLANTA) 200-200-20 MG/5ML suspension 30 mL  30 mL Oral Q4H PRN Bobbitt, Shalon E, NP       cariprazine (VRAYLAR) capsule 3 mg  3 mg Oral Daily Bobbitt, Shalon E, NP   3 mg at 07/16/21 1453   diclofenac Sodium (VOLTAREN) 1 % topical gel 2 g  2 g Topical QID Bobbitt, Shalon E, NP   2 g at 07/15/21 2339   escitalopram (LEXAPRO) tablet 10 mg  10 mg Oral Daily Bobbitt, Shalon E, NP   10 mg at 07/16/21 1455   gabapentin (NEURONTIN) capsule 300 mg  300 mg Oral TID Bobbitt, Shalon E, NP   300 mg at 07/16/21 1453   hydrOXYzine (ATARAX) tablet 25 mg  25 mg Oral TID PRN Merrily Brittle, DO   25 mg at 07/16/21 1234   hydrOXYzine (ATARAX) tablet 25 mg  25 mg Oral Q6H PRN Merrily Brittle, DO       loperamide (IMODIUM) capsule 2-4 mg  2-4 mg Oral PRN Merrily Brittle, DO       LORazepam (ATIVAN) tablet 1 mg  1 mg Oral Q6H PRN Merrily Brittle, DO       magnesium hydroxide (MILK OF MAGNESIA) suspension 30 mL  30 mL Oral Daily PRN Bobbitt, Shalon E, NP       melatonin tablet 3 mg  3 mg Oral QHS Bobbitt, Shalon E, NP   3 mg at 07/15/21 2324   mometasone-formoterol (DULERA) 200-5 MCG/ACT inhaler 2 puff  2 puff Inhalation BID Merrily Brittle, DO        multivitamin with minerals tablet 1 tablet  1 tablet Oral Daily Merrily Brittle, DO       neomycin-bacitracin-polymyxin [phone] OINT 1 application   1 application  Topical BID PRN Bobbitt, Shalon E, NP       OLANZapine zydis (ZYPREXA) disintegrating tablet 10 mg  10 mg Oral BID PRN Merrily Brittle, DO       Or   OLANZapine (ZYPREXA) injection 10 mg  10 mg Intramuscular BID PRN Merrily Brittle, DO       ondansetron (ZOFRAN-ODT) disintegrating tablet 4 mg  4 mg Oral Q6H PRN Merrily Brittle, DO       pantoprazole (PROTONIX) EC tablet 40 mg  40 mg Oral Daily Bobbitt, Shalon E, NP   40 mg at 07/15/21 2324   propranolol (INDERAL) tablet 10 mg  10 mg Oral BID Bobbitt, Shalon E, NP   10 mg at 07/15/21 2323   QUEtiapine (SEROQUEL) tablet 50 mg  50 mg Oral QHS Bobbitt, Shalon E, NP   50 mg at 07/15/21 2323   [START ON 07/17/2021] thiamine tablet 100 mg  100 mg Oral Daily Merrily Brittle, DO       traZODone (DESYREL) tablet 50 mg  50 mg Oral QHS PRN Bobbitt, Shalon E, NP       traZODone (DESYREL) tablet 50 mg  50 mg Oral QHS PRN Merrily Brittle, DO       Current Outpatient Medications  Medication Sig Dispense Refill   albuterol (VENTOLIN HFA) 108 (90 Base) MCG/ACT inhaler Inhale 2 puffs into the lungs every 6 (six) hours as needed for wheezing or shortness of breath.  budesonide-formoterol (SYMBICORT) 160-4.5 MCG/ACT inhaler Inhale 2 puffs into the lungs 2 (two) times daily.     calcium carbonate (TUMS - DOSED IN MG ELEMENTAL CALCIUM) 500 MG chewable tablet Chew 1 tablet by mouth daily as needed for indigestion or heartburn.     cariprazine (VRAYLAR) 3 MG capsule Take 1 capsule (3 mg total) by mouth daily. 30 capsule    diclofenac Sodium (VOLTAREN) 1 % GEL Apply 2 g topically 4 (four) times daily. 100 g 0   escitalopram (LEXAPRO) 10 MG tablet Take 1 tablet (10 mg total) by mouth daily.     gabapentin (NEURONTIN) 300 MG capsule Take 1 capsule (300 mg total) by mouth 3 (three) times daily for 7 days. 21  capsule 0   hydrOXYzine (ATARAX) 10 MG tablet Take 1 tablet (10 mg total) by mouth 3 (three) times daily as needed for anxiety. 30 tablet 0   melatonin 3 MG TABS tablet Take 3 mg by mouth at bedtime.     pantoprazole (PROTONIX) 40 MG tablet TAKE 1 TABLET BY MOUTH DAILY (Patient taking differently: 40 mg daily.) 30 tablet 0   propranolol (INDERAL) 10 MG tablet Take 1 tablet (10 mg total) by mouth 2 (two) times daily.     QUEtiapine (SEROQUEL) 50 MG tablet Take 1 tablet (50 mg total) by mouth at bedtime for 7 days. 7 tablet 0   traZODone (DESYREL) 50 MG tablet Take 1 tablet (50 mg total) by mouth at bedtime as needed for sleep.      Labs  Lab Results:  Admission on 07/15/2021  Component Date Value Ref Range Status   SARS Coronavirus 2 by RT PCR 07/15/2021 NEGATIVE  NEGATIVE Final   Comment: (NOTE) SARS-CoV-2 target nucleic acids are NOT DETECTED.  The SARS-CoV-2 RNA is generally detectable in upper respiratory specimens during the acute phase of infection. The lowest concentration of SARS-CoV-2 viral copies this assay can detect is 138 copies/mL. A negative result does not preclude SARS-Cov-2 infection and should not be used as the sole basis for treatment or other patient management decisions. A negative result may occur with  improper specimen collection/handling, submission of specimen other than nasopharyngeal swab, presence of viral mutation(s) within the areas targeted by this assay, and inadequate number of viral copies(<138 copies/mL). A negative result must be combined with clinical observations, patient history, and epidemiological information. The expected result is Negative.  Fact Sheet for Patients:  EntrepreneurPulse.com.au  Fact Sheet for Healthcare Providers:  IncredibleEmployment.be  This test is no                          t yet approved or cleared by the [place] FDA and  has been authorized for detection and/or diagnosis  of SARS-CoV-2 by FDA under an Emergency Use Authorization (EUA). This EUA will remain  in effect (meaning this test can be used) for the duration of the COVID-19 declaration under Section 564(b)(1) of the Act, 21 U.S.C.section 360bbb-3(b)(1), unless the authorization is terminated  or revoked sooner.       Influenza A by PCR 07/15/2021 NEGATIVE  NEGATIVE Final   Influenza B by PCR 07/15/2021 NEGATIVE  NEGATIVE Final   Comment: (NOTE) The Xpert Xpress SARS-CoV-2/FLU/RSV plus assay is intended as an aid in the diagnosis of influenza from Nasopharyngeal swab specimens and should not be used as a sole basis for treatment. Nasal washings and aspirates are unacceptable for Xpert Xpress SARS-CoV-2/FLU/RSV testing.  Fact Sheet for Patients:  EntrepreneurPulse.com.au  Fact Sheet for Healthcare Providers: IncredibleEmployment.be  This test is not yet approved or cleared by the [place] FDA and has been authorized for detection and/or diagnosis of SARS-CoV-2 by FDA under an Emergency Use Authorization (EUA). This EUA will remain in effect (meaning this test can be used) for the duration of the COVID-19 declaration under Section 564(b)(1) of the Act, 21 U.S.C. section 360bbb-3(b)(1), unless the authorization is terminated or revoked.  Performed at [facility], [place] [address] [place], [place] [address]    POC Amphetamine UR 07/15/2021 None Detected  NONE DETECTED (Cut Off Level 1000 ng/mL) Final   POC Secobarbital (BAR) 07/15/2021 None Detected  NONE DETECTED (Cut Off Level 300 ng/mL) Final   POC Buprenorphine (BUP) 07/15/2021 None Detected  NONE DETECTED (Cut Off Level 10 ng/mL) Final   POC Oxazepam (BZO) 07/15/2021 Positive (A)  NONE DETECTED (Cut Off Level 300 ng/mL) Final   POC Cocaine UR 07/15/2021 None Detected  NONE DETECTED (Cut Off Level 300 ng/mL) Final   POC Methamphetamine UR 07/15/2021 None Detected  NONE DETECTED (Cut Off Level  1000 ng/mL) Final   POC Morphine 07/15/2021 None Detected  NONE DETECTED (Cut Off Level 300 ng/mL) Final   POC Methadone UR 07/15/2021 None Detected  NONE DETECTED (Cut Off Level 300 ng/mL) Final   POC Oxycodone UR 07/15/2021 None Detected  NONE DETECTED (Cut Off Level 100 ng/mL) Final   POC Marijuana UR 07/15/2021 None Detected  NONE DETECTED (Cut Off Level 50 ng/mL) Final   SARSCOV2ONAVIRUS 2 AG 07/15/2021 NEGATIVE  NEGATIVE Final   Comment: (NOTE) SARS-CoV-2 antigen NOT DETECTED.   Negative results are presumptive.  Negative results do not preclude SARS-CoV-2 infection and should not be used as the sole basis for treatment or other patient management decisions, including infection  control decisions, particularly in the presence of clinical signs and  symptoms consistent with COVID-19, or in those who have been in contact with the virus.  Negative results must be combined with clinical observations, patient history, and epidemiological information. The expected result is Negative.  Fact Sheet for Patients: HandmadeRecipes.com.cy  Fact Sheet for Healthcare Providers: FuneralLife.at  This test is not yet approved or cleared by the [place] FDA and  has been authorized for detection and/or diagnosis of SARS-CoV-2 by FDA under an Emergency Use Authorization (EUA).  This EUA will remain in effect (meaning this test can be used) for the duration of  the COV                          ID-19 declaration under Section 564(b)(1) of the Act, 21 U.S.C. section 360bbb-3(b)(1), unless the authorization is terminated or revoked sooner.     Preg Test, Ur 07/15/2021 NEGATIVE  NEGATIVE Final   Comment:        THE SENSITIVITY OF THIS METHODOLOGY IS >24 mIU/mL   Admission on 07/12/2021, Discharged on 07/14/2021  Component Date Value Ref Range Status   SARS Coronavirus 2 by RT PCR 07/12/2021 NEGATIVE  NEGATIVE Final   Comment: (NOTE) SARS-CoV-2  target nucleic acids are NOT DETECTED.  The SARS-CoV-2 RNA is generally detectable in upper respiratory specimens during the acute phase of infection. The lowest concentration of SARS-CoV-2 viral copies this assay can detect is 138 copies/mL. A negative result does not preclude SARS-Cov-2 infection and should not be used as the sole basis for treatment or other patient management decisions. A negative result may occur with  improper specimen collection/handling, submission of specimen other than nasopharyngeal swab, presence of viral mutation(s) within the areas targeted by this assay, and inadequate number of viral copies(<138 copies/mL). A negative result must be combined with clinical observations, patient history, and epidemiological information. The expected result is Negative.  Fact Sheet for Patients:  EntrepreneurPulse.com.au  Fact Sheet for Healthcare Providers:  IncredibleEmployment.be  This test is no                          t yet approved or cleared by the [place] FDA and  has been authorized for detection and/or diagnosis of SARS-CoV-2 by FDA under an Emergency Use Authorization (EUA). This EUA will remain  in effect (meaning this test can be used) for the duration of the COVID-19 declaration under Section 564(b)(1) of the Act, 21 U.S.C.section 360bbb-3(b)(1), unless the authorization is terminated  or revoked sooner.       Influenza A by PCR 07/12/2021 NEGATIVE  NEGATIVE Final   Influenza B by PCR 07/12/2021 NEGATIVE  NEGATIVE Final   Comment: (NOTE) The Xpert Xpress SARS-CoV-2/FLU/RSV plus assay is intended as an aid in the diagnosis of influenza from Nasopharyngeal swab specimens and should not be used as a sole basis for treatment. Nasal washings and aspirates are unacceptable for Xpert Xpress SARS-CoV-2/FLU/RSV testing.  Fact Sheet for Patients: EntrepreneurPulse.com.au  Fact Sheet for Healthcare  Providers: IncredibleEmployment.be  This test is not yet approved or cleared by the [place] FDA and has been authorized for detection and/or diagnosis of SARS-CoV-2 by FDA under an Emergency Use Authorization (EUA). This EUA will remain in effect (meaning this test can be used) for the duration of the COVID-19 declaration under Section 564(b)(1) of the Act, 21 U.S.C. section 360bbb-3(b)(1), unless the authorization is terminated or revoked.  Performed at [facility], [place] [address] [place], [place] [address]    POC Amphetamine UR 07/12/2021 None Detected  NONE DETECTED (Cut Off Level 1000 ng/mL) Final   POC Secobarbital (BAR) 07/12/2021 None Detected  NONE DETECTED (Cut Off Level 300 ng/mL) Final   POC Buprenorphine (BUP) 07/12/2021 None Detected  NONE DETECTED (Cut Off Level 10 ng/mL) Final   POC Oxazepam (BZO) 07/12/2021 Positive (A)  NONE DETECTED (Cut Off Level 300 ng/mL) Final   POC Cocaine UR 07/12/2021 None Detected  NONE DETECTED (Cut Off Level 300 ng/mL) Final   POC Methamphetamine UR 07/12/2021 None Detected  NONE DETECTED (Cut Off Level 1000 ng/mL) Final   POC Morphine 07/12/2021 None Detected  NONE DETECTED (Cut Off Level 300 ng/mL) Final   POC Methadone UR 07/12/2021 None Detected  NONE DETECTED (Cut Off Level 300 ng/mL) Final   POC Oxycodone UR 07/12/2021 None Detected  NONE DETECTED (Cut Off Level 100 ng/mL) Final   POC Marijuana UR 07/12/2021 None Detected  NONE DETECTED (Cut Off Level 50 ng/mL) Final   SARS Coronavirus 2 Ag 07/12/2021 Negative  Negative Preliminary   Preg Test, Ur 07/12/2021 NEGATIVE  NEGATIVE Final   Comment:        THE SENSITIVITY OF THIS METHODOLOGY IS >24 mIU/mL    SARSCOV2ONAVIRUS 2 AG 07/12/2021 NEGATIVE  NEGATIVE Final   Comment: (NOTE) SARS-CoV-2 antigen NOT DETECTED.   Negative results are presumptive.  Negative results do not preclude SARS-CoV-2 infection and should not be used as the sole basis  for treatment or other patient management decisions, including infection  control decisions, particularly in the presence of clinical signs and  symptoms consistent with COVID-19, or in those who have been in contact with the virus.  Negative results must be combined with clinical observations, patient history, and epidemiological information. The expected result is Negative.  Fact Sheet for Patients: HandmadeRecipes.com.cy  Fact Sheet for Healthcare Providers: FuneralLife.at  This test is not yet approved or cleared by the [place] FDA and  has been authorized for detection and/or diagnosis of SARS-CoV-2 by FDA under an Emergency Use Authorization (EUA).  This EUA will remain in effect (meaning this test can be used) for the duration of  the COV                          ID-19 declaration under Section 564(b)(1) of the Act, 21 U.S.C. section 360bbb-3(b)(1), unless the authorization is terminated or revoked sooner.    No results displayed because visit has over 200 results.    Admission on 06/26/2021, Discharged on 06/27/2021  Component Date Value Ref Range Status   Glucose-Capillary 06/26/2021 100 (H)  70 - 99 mg/dL Final   Glucose reference range applies only to samples taken after fasting for at least 8 hours.   Sodium 06/26/2021 138  135 - 145 mmol/L Final   Potassium 06/26/2021 4.1  3.5 - 5.1 mmol/L Final   Chloride 06/26/2021 109  98 - 111 mmol/L Final   CO2 06/26/2021 22  22 - 32 mmol/L Final   Glucose, Bld 06/26/2021 111 (H)  70 - 99 mg/dL Final   Glucose reference range applies only to samples taken after fasting for at least 8 hours.   BUN 06/26/2021 6  6 - 20 mg/dL Final   Creatinine, Ser 06/26/2021 0.74  0.44 - 1.00 mg/dL Final   Calcium 06/26/2021 9.6  8.9 - 10.3 mg/dL Final   Total Protein 06/26/2021 7.1  6.5 - 8.1 g/dL Final   Albumin 06/26/2021 4.1  3.5 - 5.0 g/dL Final   AST 06/26/2021 21  15 - 41 U/L Final    ALT 06/26/2021 20  0 - 44 U/L Final   Alkaline Phosphatase 06/26/2021 60  38 - 126 U/L Final   Total Bilirubin 06/26/2021 0.6  0.3 - 1.2 mg/dL Final   GFR, Estimated 06/26/2021 >60  >60 mL/min Final   Comment: (NOTE) Calculated using the CKD-EPI Creatinine Equation (2021)    Anion gap 06/26/2021 7  5 - 15 Final   Performed at [facility] [address], [place], [place] [address]   Salicylate Lvl 76/73/4193 <7.0 (L)  7.0 - 30.0 mg/dL Final   Performed at [facility] [address] [place], [place] [address]   Acetaminophen (Tylenol), Serum 06/26/2021 <10 (L)  10 - 30 ug/mL Final   Comment: (NOTE) Therapeutic concentrations vary significantly. A range of 10-30 ug/mL  may be an effective concentration for many patients. However, some  are best treated at concentrations outside of this range. Acetaminophen concentrations >150 ug/mL at 4 hours after ingestion  and >50 ug/mL at 12 hours after ingestion are often associated with  toxic reactions.  Performed at [facility], [place] [address] [place], [place] [address]    Alcohol, Ethyl (B) 06/26/2021 <10  <10 mg/dL Final   Comment: (NOTE) Lowest detectable limit for serum alcohol is 10 mg/dL.  For medical purposes only. Performed at [facility], [place] [address], [place],  [address]    Opiates 06/26/2021 NONE DETECTED  NONE DETECTED Final   Cocaine  06/26/2021 NONE DETECTED  NONE DETECTED Final   Benzodiazepines 06/26/2021 NONE DETECTED  NONE DETECTED Final   Amphetamines 06/26/2021 NONE DETECTED  NONE DETECTED Final   Tetrahydrocannabinol 06/26/2021 NONE DETECTED  NONE DETECTED Final   Barbiturates 06/26/2021 NONE DETECTED  NONE DETECTED Final   Comment: (NOTE) DRUG SCREEN FOR MEDICAL PURPOSES ONLY.  IF CONFIRMATION IS NEEDED FOR ANY PURPOSE, NOTIFY LAB WITHIN 5 DAYS.  LOWEST DETECTABLE LIMITS FOR URINE DRUG SCREEN Drug Class                     Cutoff (ng/mL) Amphetamine and metabolites     1000 Barbiturate and metabolites    200 Benzodiazepine                 010 Tricyclics and metabolites     300 Opiates and metabolites        300 Cocaine and metabolites        300 THC                            50 Performed at [facility], [place] [address] [place], [place] [address]    WBC 06/26/2021 9.4  4.0 - 10.5 K/uL Final   RBC 06/26/2021 4.89  3.87 - 5.11 MIL/uL Final   Hemoglobin 06/26/2021 12.9  12.0 - 15.0 g/dL Final   HCT 06/26/2021 40.1  36.0 - 46.0 % Final   MCV 06/26/2021 82.0  80.0 - 100.0 fL Final   MCH 06/26/2021 26.4  26.0 - 34.0 pg Final   MCHC 06/26/2021 32.2  30.0 - 36.0 g/dL Final   RDW 06/26/2021 15.5  11.5 - 15.5 % Final   Platelets 06/26/2021 298  150 - 400 K/uL Final   nRBC 06/26/2021 0.0  0.0 - 0.2 % Final   Neutrophils Relative % 06/26/2021 62  % Final   Neutro Abs 06/26/2021 5.8  1.7 - 7.7 K/uL Final   Lymphocytes Relative 06/26/2021 32  % Final   Lymphs Abs 06/26/2021 3.0  0.7 - 4.0 K/uL Final   Monocytes Relative 06/26/2021 5  % Final   Monocytes Absolute 06/26/2021 0.4  0.1 - 1.0 K/uL Final   Eosinophils Relative 06/26/2021 1  % Final   Eosinophils Absolute 06/26/2021 0.1  0.0 - 0.5 K/uL Final   Basophils Relative 06/26/2021 0  % Final   Basophils Absolute 06/26/2021 0.0  0.0 - 0.1 K/uL Final   Immature Granulocytes 06/26/2021 0  % Final   Abs Immature Granulocytes 06/26/2021 0.03  0.00 - 0.07 K/uL Final   Performed at [facility], [place] [address] [place], [place] [address]   I-stat hCG, quantitative 06/26/2021 <5.0  <5 mIU/mL Final   Comment 3 06/26/2021          Final   Comment:   GEST. AGE      CONC.  (mIU/mL)   <=1 WEEK        5 - 50     2 WEEKS       50 - 500     3 WEEKS       100 - 10,000     4 WEEKS     1,000 - 30,000        FEMALE AND NON-PREGNANT FEMALE:     LESS THAN 5 mIU/mL    Magnesium 06/26/2021 1.8  1.7 - 2.4 mg/dL Final   Performed at [facility], [place]  [address], [place], [place] [address]   Acetaminophen  (Tylenol), Serum 06/26/2021 <10 (L)  10 - 30 ug/mL Final   Comment: (NOTE) Therapeutic concentrations vary significantly. A range of 10-30 ug/mL  may be an effective concentration for many patients. However, some  are best treated at concentrations outside of this range. Acetaminophen concentrations >150 ug/mL at 4 hours after ingestion  and >50 ug/mL at 12 hours after ingestion are often associated with  toxic reactions.  Performed at [facility], [place] [address], [place], [place] [address]    WBC 06/26/2021 6.3  4.0 - 10.5 K/uL Final   RBC 06/26/2021 3.92  3.87 - 5.11 MIL/uL Final   Hemoglobin 06/26/2021 10.3 (L)  12.0 - 15.0 g/dL Final   HCT 06/26/2021 32.4 (L)  36.0 - 46.0 % Final   MCV 06/26/2021 82.7  80.0 - 100.0 fL Final   MCH 06/26/2021 26.3  26.0 - 34.0 pg Final   MCHC 06/26/2021 31.8  30.0 - 36.0 g/dL Final   RDW 06/26/2021 15.5  11.5 - 15.5 % Final   Platelets 06/26/2021 201  150 - 400 K/uL Final   nRBC 06/26/2021 0.0  0.0 - 0.2 % Final   Neutrophils Relative % 06/26/2021 56  % Final   Neutro Abs 06/26/2021 3.5  1.7 - 7.7 K/uL Final   Lymphocytes Relative 06/26/2021 38  % Final   Lymphs Abs 06/26/2021 2.4  0.7 - 4.0 K/uL Final   Monocytes Relative 06/26/2021 5  % Final   Monocytes Absolute 06/26/2021 0.3  0.1 - 1.0 K/uL Final   Eosinophils Relative 06/26/2021 1  % Final   Eosinophils Absolute 06/26/2021 0.1  0.0 - 0.5 K/uL Final   Basophils Relative 06/26/2021 0  % Final   Basophils Absolute 06/26/2021 0.0  0.0 - 0.1 K/uL Final   Immature Granulocytes 06/26/2021 0  % Final   Abs Immature Granulocytes 06/26/2021 0.01  0.00 - 0.07 K/uL Final   Performed at [facility], [place] [address] [place], [place] [address]   Sodium 06/26/2021 138  135 - 145 mmol/L Final   Potassium 06/26/2021 3.7  3.5 - 5.1 mmol/L Final   Chloride 06/26/2021 114 (H)  98 - 111 mmol/L Final   CO2 06/26/2021 22  22 - 32 mmol/L Final   Glucose, Bld 06/26/2021 99  70 - 99 mg/dL Final    Glucose reference range applies only to samples taken after fasting for at least 8 hours.   BUN 06/26/2021 5 (L)  6 - 20 mg/dL Final   Creatinine, Ser 06/26/2021 0.62  0.44 - 1.00 mg/dL Final   Calcium 06/26/2021 8.0 (L)  8.9 - 10.3 mg/dL Final   Total Protein 06/26/2021 5.6 (L)  6.5 - 8.1 g/dL Final   Albumin 06/26/2021 3.1 (L)  3.5 - 5.0 g/dL Final   AST 06/26/2021 15  15 - 41 U/L Final   ALT 06/26/2021 15  0 - 44 U/L Final   Alkaline Phosphatase 06/26/2021 46  38 - 126 U/L Final   Total Bilirubin 06/26/2021 0.5  0.3 - 1.2 mg/dL Final   GFR, Estimated 06/26/2021 >60  >60 mL/min Final   Comment: (NOTE) Calculated using the CKD-EPI Creatinine Equation (2021)    Anion gap 06/26/2021 2 (L)  5 - 15 Final   Comment: Electrolytes repeated to confirm. Performed at [facility], [place] [address], [place], [place] [address]   Admission on 06/23/2021, Discharged on 06/24/2021  Component Date Value Ref Range Status  SARS Coronavirus 2 by RT PCR 06/24/2021 NEGATIVE  NEGATIVE Final   Comment: (NOTE) SARS-CoV-2 target nucleic acids are NOT DETECTED.  The SARS-CoV-2 RNA is generally detectable in upper respiratory specimens during the acute phase of infection. The lowest concentration of SARS-CoV-2 viral copies this assay can detect is 138 copies/mL. A negative result does not preclude SARS-Cov-2 infection and should not be used as the sole basis for treatment or other patient management decisions. A negative result may occur with  improper specimen collection/handling, submission of specimen other than nasopharyngeal swab, presence of viral mutation(s) within the areas targeted by this assay, and inadequate number of viral copies(<138 copies/mL). A negative result must be combined with clinical observations, patient history, and epidemiological information. The expected result is Negative.  Fact Sheet for Patients:  EntrepreneurPulse.com.au  Fact Sheet for Healthcare  Providers:  IncredibleEmployment.be  This test is no                          t yet approved or cleared by the [place] FDA and  has been authorized for detection and/or diagnosis of SARS-CoV-2 by FDA under an Emergency Use Authorization (EUA). This EUA will remain  in effect (meaning this test can be used) for the duration of the COVID-19 declaration under Section 564(b)(1) of the Act, 21 U.S.C.section 360bbb-3(b)(1), unless the authorization is terminated  or revoked sooner.       Influenza A by PCR 06/24/2021 NEGATIVE  NEGATIVE Final   Influenza B by PCR 06/24/2021 NEGATIVE  NEGATIVE Final   Comment: (NOTE) The Xpert Xpress SARS-CoV-2/FLU/RSV plus assay is intended as an aid in the diagnosis of influenza from Nasopharyngeal swab specimens and should not be used as a sole basis for treatment. Nasal washings and aspirates are unacceptable for Xpert Xpress SARS-CoV-2/FLU/RSV testing.  Fact Sheet for Patients: EntrepreneurPulse.com.au  Fact Sheet for Healthcare Providers: IncredibleEmployment.be  This test is not yet approved or cleared by the [place] FDA and has been authorized for detection and/or diagnosis of SARS-CoV-2 by FDA under an Emergency Use Authorization (EUA). This EUA will remain in effect (meaning this test can be used) for the duration of the COVID-19 declaration under Section 564(b)(1) of the Act, 21 U.S.C. section 360bbb-3(b)(1), unless the authorization is terminated or revoked.  Performed at [facility], [place] [address] [place], [place] [address]    POC Amphetamine UR 06/24/2021 None Detected  NONE DETECTED (Cut Off Level 1000 ng/mL) Final   POC Secobarbital (BAR) 06/24/2021 None Detected  NONE DETECTED (Cut Off Level 300 ng/mL) Final   POC Buprenorphine (BUP) 06/24/2021 None Detected  NONE DETECTED (Cut Off Level 10 ng/mL) Final   POC Oxazepam (BZO) 06/24/2021 Positive (A)  NONE  DETECTED (Cut Off Level 300 ng/mL) Final   POC Cocaine UR 06/24/2021 None Detected  NONE DETECTED (Cut Off Level 300 ng/mL) Final   POC Methamphetamine UR 06/24/2021 None Detected  NONE DETECTED (Cut Off Level 1000 ng/mL) Final   POC Morphine 06/24/2021 None Detected  NONE DETECTED (Cut Off Level 300 ng/mL) Final   POC Methadone UR 06/24/2021 None Detected  NONE DETECTED (Cut Off Level 300 ng/mL) Final   POC Oxycodone UR 06/24/2021 None Detected  NONE DETECTED (Cut Off Level 100 ng/mL) Final   POC Marijuana UR 06/24/2021 None Detected  NONE DETECTED (Cut Off Level 50 ng/mL) Final   SARSCOV2ONAVIRUS 2 AG 06/24/2021 NEGATIVE  NEGATIVE Final   Comment: (NOTE) SARS-CoV-2 antigen NOT DETECTED.  Negative results are presumptive.  Negative results do not preclude SARS-CoV-2 infection and should not be used as the sole basis for treatment or other patient management decisions, including infection  control decisions, particularly in the presence of clinical signs and  symptoms consistent with COVID-19, or in those who have been in contact with the virus.  Negative results must be combined with clinical observations, patient history, and epidemiological information. The expected result is Negative.  Fact Sheet for Patients: HandmadeRecipes.com.cy  Fact Sheet for Healthcare Providers: FuneralLife.at  This test is not yet approved or cleared by the [place] FDA and  has been authorized for detection and/or diagnosis of SARS-CoV-2 by FDA under an Emergency Use Authorization (EUA).  This EUA will remain in effect (meaning this test can be used) for the duration of  the COV                          ID-19 declaration under Section 564(b)(1) of the Act, 21 U.S.C. section 360bbb-3(b)(1), unless the authorization is terminated or revoked sooner.     Preg Test, Ur 06/24/2021 NEGATIVE  NEGATIVE Final   Comment:        THE SENSITIVITY OF  THIS METHODOLOGY IS >24 mIU/mL   Admission on 06/23/2021, Discharged on 06/23/2021  Component Date Value Ref Range Status   Preg Test, Ur 06/23/2021 NEGATIVE  NEGATIVE Final   Comment:        THE SENSITIVITY OF THIS METHODOLOGY IS >20 mIU/mL. Performed at [facility], [place] [address], [place], [place] [address]   Admission on 06/04/2021, Discharged on 06/04/2021  Component Date Value Ref Range Status   Sodium 06/04/2021 138  135 - 145 mmol/L Final   Potassium 06/04/2021 3.4 (L)  3.5 - 5.1 mmol/L Final   Chloride 06/04/2021 108  98 - 111 mmol/L Final   CO2 06/04/2021 23  22 - 32 mmol/L Final   Glucose, Bld 06/04/2021 94  70 - 99 mg/dL Final   Glucose reference range applies only to samples taken after fasting for at least 8 hours.   BUN 06/04/2021 7  6 - 20 mg/dL Final   Creatinine, Ser 06/04/2021 0.73  0.44 - 1.00 mg/dL Final   Calcium 06/04/2021 9.1  8.9 - 10.3 mg/dL Final   Total Protein 06/04/2021 7.2  6.5 - 8.1 g/dL Final   Albumin 06/04/2021 4.0  3.5 - 5.0 g/dL Final   AST 06/04/2021 17  15 - 41 U/L Final   ALT 06/04/2021 14  0 - 44 U/L Final   Alkaline Phosphatase 06/04/2021 71  38 - 126 U/L Final   Total Bilirubin 06/04/2021 0.4  0.3 - 1.2 mg/dL Final   GFR, Estimated 06/04/2021 >60  >60 mL/min Final   Comment: (NOTE) Calculated using the CKD-EPI Creatinine Equation (2021)    Anion gap 06/04/2021 7  5 - 15 Final   Performed at [place] [address], [place], [place] [address]   Salicylate Lvl 09/81/1914 <7.0 (L)  7.0 - 30.0 mg/dL Final   Performed at [place] [address], [place], [place] [address]   Acetaminophen (Tylenol), Serum 06/04/2021 <10 (L)  10 - 30 ug/mL Final   Comment: (NOTE) Therapeutic concentrations vary significantly. A range of 10-30 ug/mL  may be an effective concentration for many patients. However, some  are best treated at concentrations outside of this range. Acetaminophen concentrations >150 ug/mL at  4 hours after ingestion  and >50 ug/mL at 12 hours after ingestion are often associated with  toxic reactions.  Performed at [facility], [place] [address], [place], [place] [address]    Alcohol, Ethyl (B) 06/04/2021 <10  <10 mg/dL Final   Comment: (NOTE) Lowest detectable limit for serum alcohol is 10 mg/dL.  For medical purposes only. Performed at [facility], [place] [address], [name], [place] [address]    Opiates 06/04/2021 NONE DETECTED  NONE DETECTED Final   Cocaine 06/04/2021 NONE DETECTED  NONE DETECTED Final   Benzodiazepines 06/04/2021 NONE DETECTED  NONE DETECTED Final   Amphetamines 06/04/2021 NONE DETECTED  NONE DETECTED Final   Tetrahydrocannabinol 06/04/2021 NONE DETECTED  NONE DETECTED Final   Barbiturates 06/04/2021 NONE DETECTED  NONE DETECTED Final   Comment: (NOTE) DRUG SCREEN FOR MEDICAL PURPOSES ONLY.  IF CONFIRMATION IS NEEDED FOR ANY PURPOSE, NOTIFY LAB WITHIN 5 DAYS.  LOWEST DETECTABLE LIMITS FOR URINE DRUG SCREEN Drug Class                     Cutoff (ng/mL) Amphetamine and metabolites    1000 Barbiturate and metabolites    200 Benzodiazepine                 270 Tricyclics and metabolites     300 Opiates and metabolites        300 Cocaine and metabolites        300 THC                            50 Performed at [facility], [place] [address], [place], [place] [address]    WBC 06/04/2021 8.2  4.0 - 10.5 K/uL Final   RBC 06/04/2021 4.73  3.87 - 5.11 MIL/uL Final   Hemoglobin 06/04/2021 12.2  12.0 - 15.0 g/dL Final   HCT 06/04/2021 38.2  36.0 - 46.0 % Final   MCV 06/04/2021 80.8  80.0 - 100.0 fL Final   MCH 06/04/2021 25.8 (L)  26.0 - 34.0 pg Final   MCHC 06/04/2021 31.9  30.0 - 36.0 g/dL Final   RDW 06/04/2021 13.9  11.5 - 15.5 % Final   Platelets 06/04/2021 344  150 - 400 K/uL Final   nRBC 06/04/2021 0.0  0.0 - 0.2 % Final   Neutrophils Relative % 06/04/2021 53  % Final   Neutro Abs 06/04/2021 4.3  1.7 - 7.7 K/uL Final    Lymphocytes Relative 06/04/2021 38  % Final   Lymphs Abs 06/04/2021 3.1  0.7 - 4.0 K/uL Final   Monocytes Relative 06/04/2021 7  % Final   Monocytes Absolute 06/04/2021 0.6  0.1 - 1.0 K/uL Final   Eosinophils Relative 06/04/2021 1  % Final   Eosinophils Absolute 06/04/2021 0.1  0.0 - 0.5 K/uL Final   Basophils Relative 06/04/2021 1  % Final   Basophils Absolute 06/04/2021 0.1  0.0 - 0.1 K/uL Final   Immature Granulocytes 06/04/2021 0  % Final   Abs Immature Granulocytes 06/04/2021 0.03  0.00 - 0.07 K/uL Final   Performed at [facility], [place] [address], [place], [place] [address]   I-stat hCG, quantitative 06/04/2021 <5.0  <5 mIU/mL Final   Comment 3 06/04/2021          Final   Comment:   GEST. AGE      CONC.  (mIU/mL)   <=1 WEEK  5 - 50     2 WEEKS       50 - 500     3 WEEKS       100 - 10,000     4 WEEKS     1,000 - 30,000        FEMALE AND NON-PREGNANT FEMALE:     LESS THAN 5 mIU/mL    SARS Coronavirus 2 by RT PCR 06/04/2021 NEGATIVE  NEGATIVE Final   Comment: (NOTE) SARS-CoV-2 target nucleic acids are NOT DETECTED.  The SARS-CoV-2 RNA is generally detectable in upper respiratory specimens during the acute phase of infection. The lowest concentration of SARS-CoV-2 viral copies this assay can detect is 138 copies/mL. A negative result does not preclude SARS-Cov-2 infection and should not be used as the sole basis for treatment or other patient management decisions. A negative result may occur with  improper specimen collection/handling, submission of specimen other than nasopharyngeal swab, presence of viral mutation(s) within the areas targeted by this assay, and inadequate number of viral copies(<138 copies/mL). A negative result must be combined with clinical observations, patient history, and epidemiological information. The expected result is Negative.  Fact Sheet for Patients:  EntrepreneurPulse.com.au  Fact Sheet for Healthcare Providers:   IncredibleEmployment.be  This test is no                          t yet approved or cleared by the [place] FDA and  has been authorized for detection and/or diagnosis of SARS-CoV-2 by FDA under an Emergency Use Authorization (EUA). This EUA will remain  in effect (meaning this test can be used) for the duration of the COVID-19 declaration under Section 564(b)(1) of the Act, 21 U.S.C.section 360bbb-3(b)(1), unless the authorization is terminated  or revoked sooner.       Influenza A by PCR 06/04/2021 NEGATIVE  NEGATIVE Final   Influenza B by PCR 06/04/2021 NEGATIVE  NEGATIVE Final   Comment: (NOTE) The Xpert Xpress SARS-CoV-2/FLU/RSV plus assay is intended as an aid in the diagnosis of influenza from Nasopharyngeal swab specimens and should not be used as a sole basis for treatment. Nasal washings and aspirates are unacceptable for Xpert Xpress SARS-CoV-2/FLU/RSV testing.  Fact Sheet for Patients: EntrepreneurPulse.com.au  Fact Sheet for Healthcare Providers: IncredibleEmployment.be  This test is not yet approved or cleared by the [place] FDA and has been authorized for detection and/or diagnosis of SARS-CoV-2 by FDA under an Emergency Use Authorization (EUA). This EUA will remain in effect (meaning this test can be used) for the duration of the COVID-19 declaration under Section 564(b)(1) of the Act, 21 U.S.C. section 360bbb-3(b)(1), unless the authorization is terminated or revoked.  Performed at [facility], [place] [address], [place], [place] [address]   Admission on 05/29/2021, Discharged on 05/30/2021  Component Date Value Ref Range Status   SARS Coronavirus 2 by RT PCR 05/29/2021 NEGATIVE  NEGATIVE Final   Comment: (NOTE) SARS-CoV-2 target nucleic acids are NOT DETECTED.  The SARS-CoV-2 RNA is generally detectable in upper respiratory specimens during the acute phase of infection. The  lowest concentration of SARS-CoV-2 viral copies this assay can detect is 138 copies/mL. A negative result does not preclude SARS-Cov-2 infection and should not be used as the sole basis for treatment or other patient management decisions. A negative result may occur with  improper specimen collection/handling, submission of specimen other than nasopharyngeal swab, presence of viral mutation(s) within the areas targeted by  this assay, and inadequate number of viral copies(<138 copies/mL). A negative result must be combined with clinical observations, patient history, and epidemiological information. The expected result is Negative.  Fact Sheet for Patients:  EntrepreneurPulse.com.au  Fact Sheet for Healthcare Providers:  IncredibleEmployment.be  This test is no                          t yet approved or cleared by the [place] FDA and  has been authorized for detection and/or diagnosis of SARS-CoV-2 by FDA under an Emergency Use Authorization (EUA). This EUA will remain  in effect (meaning this test can be used) for the duration of the COVID-19 declaration under Section 564(b)(1) of the Act, 21 U.S.C.section 360bbb-3(b)(1), unless the authorization is terminated  or revoked sooner.       Influenza A by PCR 05/29/2021 NEGATIVE  NEGATIVE Final   Influenza B by PCR 05/29/2021 NEGATIVE  NEGATIVE Final   Comment: (NOTE) The Xpert Xpress SARS-CoV-2/FLU/RSV plus assay is intended as an aid in the diagnosis of influenza from Nasopharyngeal swab specimens and should not be used as a sole basis for treatment. Nasal washings and aspirates are unacceptable for Xpert Xpress SARS-CoV-2/FLU/RSV testing.  Fact Sheet for Patients: EntrepreneurPulse.com.au  Fact Sheet for Healthcare Providers: IncredibleEmployment.be  This test is not yet approved or cleared by the [place] FDA and has been authorized for  detection and/or diagnosis of SARS-CoV-2 by FDA under an Emergency Use Authorization (EUA). This EUA will remain in effect (meaning this test can be used) for the duration of the COVID-19 declaration under Section 564(b)(1) of the Act, 21 U.S.C. section 360bbb-3(b)(1), unless the authorization is terminated or revoked.  Performed at [facility], [place] [address] [place], [place] [address]    Color, Urine 05/29/2021 STRAW (A)  YELLOW Final   APPearance 05/29/2021 CLEAR  CLEAR Final   Specific Gravity, Urine 05/29/2021 1.010  1.005 - 1.030 Final   pH 05/29/2021 6.0  5.0 - 8.0 Final   Glucose, UA 05/29/2021 NEGATIVE  NEGATIVE mg/dL Final   Hgb urine dipstick 05/29/2021 NEGATIVE  NEGATIVE Final   Bilirubin Urine 05/29/2021 NEGATIVE  NEGATIVE Final   Ketones, ur 05/29/2021 NEGATIVE  NEGATIVE mg/dL Final   Protein, ur 05/29/2021 NEGATIVE  NEGATIVE mg/dL Final   Nitrite 05/29/2021 NEGATIVE  NEGATIVE Final   Leukocytes,Ua 05/29/2021 NEGATIVE  NEGATIVE Final   RBC / HPF 05/29/2021 0-5  0 - 5 RBC/hpf Final   WBC, UA 05/29/2021 0-5  0 - 5 WBC/hpf Final   Bacteria, UA 05/29/2021 NONE SEEN  NONE SEEN Final   Squamous Epithelial / LPF 05/29/2021 0-5  0 - 5 Final   Mucus 05/29/2021 PRESENT   Final   Performed at [facility], [place] [address], [place], [place] [address]   WBC 05/29/2021 8.7  4.0 - 10.5 K/uL Final   RBC 05/29/2021 4.56  3.87 - 5.11 MIL/uL Final   Hemoglobin 05/29/2021 12.1  12.0 - 15.0 g/dL Final   HCT 05/29/2021 37.8  36.0 - 46.0 % Final   MCV 05/29/2021 82.9  80.0 - 100.0 fL Final   MCH 05/29/2021 26.5  26.0 - 34.0 pg Final   MCHC 05/29/2021 32.0  30.0 - 36.0 g/dL Final   RDW 05/29/2021 14.6  11.5 - 15.5 % Final   Platelets 05/29/2021 205  150 - 400 K/uL Final   nRBC 05/29/2021 0.0  0.0 - 0.2 % Final   Performed at  [facility], [place] [address], [place], [place] [address]   Sodium 05/29/2021 138  135 - 145 mmol/L Final   Potassium 05/29/2021 3.7  3.5 - 5.1  mmol/L Final   Chloride 05/29/2021 108  98 - 111 mmol/L Final   CO2 05/29/2021 22  22 - 32 mmol/L Final   Glucose, Bld 05/29/2021 121 (H)  70 - 99 mg/dL Final   Glucose reference range applies only to samples taken after fasting for at least 8 hours.   BUN 05/29/2021 8  6 - 20 mg/dL Final   Creatinine, Ser 05/29/2021 0.66  0.44 - 1.00 mg/dL Final   Calcium 05/29/2021 9.3  8.9 - 10.3 mg/dL Final   GFR, Estimated 05/29/2021 >60  >60 mL/min Final   Comment: (NOTE) Calculated using the CKD-EPI Creatinine Equation (2021)    Anion gap 05/29/2021 8  5 - 15 Final   Performed at [facility], [place] [address] [place], [place] [address]   I-stat hCG, quantitative 05/30/2021 <5.0  <5 mIU/mL Final   Comment 3 05/30/2021          Final   Comment:   GEST. AGE      CONC.  (mIU/mL)   <=1 WEEK        5 - 50     2 WEEKS       50 - 500     3 WEEKS       100 - 10,000     4 WEEKS     1,000 - 30,000        FEMALE AND NON-PREGNANT FEMALE:     LESS THAN 5 mIU/mL   Admission on 05/01/2021, Discharged on 05/01/2021  Component Date Value Ref Range Status   SARS Coronavirus 2 by RT PCR 05/01/2021 NEGATIVE  NEGATIVE Final   Comment: (NOTE) SARS-CoV-2 target nucleic acids are NOT DETECTED.  The SARS-CoV-2 RNA is generally detectable in upper respiratory specimens during the acute phase of infection. The lowest concentration of SARS-CoV-2 viral copies this assay can detect is 138 copies/mL. A negative result does not preclude SARS-Cov-2 infection and should not be used as the sole basis for treatment or other patient management decisions. A negative result may occur with  improper specimen collection/handling, submission of specimen other than nasopharyngeal swab, presence of viral mutation(s) within the areas targeted by this assay, and inadequate number of viral copies(<138 copies/mL). A negative result must be combined with clinical observations, patient history, and epidemiological information. The  expected result is Negative.  Fact Sheet for Patients:  EntrepreneurPulse.com.au  Fact Sheet for Healthcare Providers:  IncredibleEmployment.be  This test is no                          t yet approved or cleared by the [place] FDA and  has been authorized for detection and/or diagnosis of SARS-CoV-2 by FDA under an Emergency Use Authorization (EUA). This EUA will remain  in effect (meaning this test can be used) for the duration of the COVID-19 declaration under Section 564(b)(1) of the Act, 21 U.S.C.section 360bbb-3(b)(1), unless the authorization is terminated  or revoked sooner.       Influenza A by PCR 05/01/2021 NEGATIVE  NEGATIVE Final   Influenza B by PCR 05/01/2021 NEGATIVE  NEGATIVE Final   Comment: (NOTE) The Xpert Xpress SARS-CoV-2/FLU/RSV plus assay is intended as an aid in the diagnosis of influenza from Nasopharyngeal swab specimens and should not be  used as a sole basis for treatment. Nasal washings and aspirates are unacceptable for Xpert Xpress SARS-CoV-2/FLU/RSV testing.  Fact Sheet for Patients: EntrepreneurPulse.com.au  Fact Sheet for Healthcare Providers: IncredibleEmployment.be  This test is not yet approved or cleared by the [place] FDA and has been authorized for detection and/or diagnosis of SARS-CoV-2 by FDA under an Emergency Use Authorization (EUA). This EUA will remain in effect (meaning this test can be used) for the duration of the COVID-19 declaration under Section 564(b)(1) of the Act, 21 U.S.C. section 360bbb-3(b)(1), unless the authorization is terminated or revoked.  Performed at [facility], [place] [address] [place], [place] [address]    WBC 05/01/2021 12.7 (H)  4.0 - 10.5 K/uL Final   RBC 05/01/2021 5.28 (H)  3.87 - 5.11 MIL/uL Final   Hemoglobin 05/01/2021 13.9  12.0 - 15.0 g/dL Final   HCT 05/01/2021 41.9  36.0 - 46.0 % Final   MCV 05/01/2021  79.4 (L)  80.0 - 100.0 fL Final   MCH 05/01/2021 26.3  26.0 - 34.0 pg Final   MCHC 05/01/2021 33.2  30.0 - 36.0 g/dL Final   RDW 05/01/2021 13.4  11.5 - 15.5 % Final   Platelets 05/01/2021 351  150 - 400 K/uL Final   nRBC 05/01/2021 0.0  0.0 - 0.2 % Final   Neutrophils Relative % 05/01/2021 66  % Final   Neutro Abs 05/01/2021 8.4 (H)  1.7 - 7.7 K/uL Final   Lymphocytes Relative 05/01/2021 27  % Final   Lymphs Abs 05/01/2021 3.4  0.7 - 4.0 K/uL Final   Monocytes Relative 05/01/2021 5  % Final   Monocytes Absolute 05/01/2021 0.7  0.1 - 1.0 K/uL Final   Eosinophils Relative 05/01/2021 1  % Final   Eosinophils Absolute 05/01/2021 0.1  0.0 - 0.5 K/uL Final   Basophils Relative 05/01/2021 0  % Final   Basophils Absolute 05/01/2021 0.1  0.0 - 0.1 K/uL Final   Immature Granulocytes 05/01/2021 1  % Final   Abs Immature Granulocytes 05/01/2021 0.07  0.00 - 0.07 K/uL Final   Performed at [facility], [place] [address], [place], [place] [address]   Sodium 05/01/2021 137  135 - 145 mmol/L Final   Potassium 05/01/2021 3.7  3.5 - 5.1 mmol/L Final   Chloride 05/01/2021 107  98 - 111 mmol/L Final   CO2 05/01/2021 21 (L)  22 - 32 mmol/L Final   Glucose, Bld 05/01/2021 96  70 - 99 mg/dL Final   Glucose reference range applies only to samples taken after fasting for at least 8 hours.   BUN 05/01/2021 9  6 - 20 mg/dL Final   Creatinine, Ser 05/01/2021 0.73  0.44 - 1.00 mg/dL Final   Calcium 05/01/2021 9.5  8.9 - 10.3 mg/dL Final   Total Protein 05/01/2021 7.8  6.5 - 8.1 g/dL Final   Albumin 05/01/2021 4.4  3.5 - 5.0 g/dL Final   AST 05/01/2021 18  15 - 41 U/L Final   ALT 05/01/2021 24  0 - 44 U/L Final   Alkaline Phosphatase 05/01/2021 70  38 - 126 U/L Final   Total Bilirubin 05/01/2021 0.6  0.3 - 1.2 mg/dL Final   GFR, Estimated 05/01/2021 >60  >60 mL/min Final   Comment: (NOTE) Calculated using the CKD-EPI Creatinine Equation (2021)    Anion gap 05/01/2021 9  5 - 15 Final   Performed at [facility] [address] [place], [place] [address]  Alcohol, Ethyl (B) 05/01/2021 <10  <10 mg/dL Final   Comment: (NOTE) Lowest detectable limit for serum alcohol is 10 mg/dL.  For medical purposes only. Performed at [facility], [place] [address] [place], [place] [address]    Cholesterol 05/01/2021 245 (H)  0 - 200 mg/dL Final   Triglycerides 05/01/2021 169 (H)  <150 mg/dL Final   HDL 05/01/2021 46  >40 mg/dL Final   Total CHOL/HDL Ratio 05/01/2021 5.3  RATIO Final   VLDL 05/01/2021 34  0 - 40 mg/dL Final   LDL Cholesterol 05/01/2021 165 (H)  0 - 99 mg/dL Final   Comment:        Total Cholesterol/HDL:CHD Risk Coronary Heart Disease Risk Table                     Men   Women  1/2 Average Risk   3.4   3.3  Average Risk       5.0   4.4  2 X Average Risk   9.6   7.1  3 X Average Risk  23.4   11.0        Use the calculated Patient Ratio above and the CHD Risk Table to determine the patient's CHD Risk.        ATP III CLASSIFICATION (LDL):  <100     mg/dL   Optimal  100-129  mg/dL   Near or Above                    Optimal  130-159  mg/dL   Borderline  160-189  mg/dL   High  >190     mg/dL   Very High Performed at [facility] [address], [place], [place] [address]    TSH 05/01/2021 2.572  0.350 - 4.500 uIU/mL Final   Comment: Performed by a 3rd Generation assay with a functional sensitivity of <=0.01 uIU/mL. Performed at [facility], [place] [address] [place], [place] [address]    SARS Coronavirus 2 Ag 05/01/2021 Negative (NE)  Negative Final   SARSCOV2ONAVIRUS 2 AG 05/01/2021 NEGATIVE  NEGATIVE Final   Comment: (NOTE) SARS-CoV-2 antigen NOT DETECTED.   Negative results are presumptive.  Negative results do not preclude SARS-CoV-2 infection and should not be used as the sole basis for treatment or other patient management decisions, including infection  control decisions, particularly in the presence of clinical signs and  symptoms consistent  with COVID-19, or in those who have been in contact with the virus.  Negative results must be combined with clinical observations, patient history, and epidemiological information. The expected result is Negative.  Fact Sheet for Patients: HandmadeRecipes.com.cy  Fact Sheet for Healthcare Providers: FuneralLife.at  This test is not yet approved or cleared by the [place] FDA and  has been authorized for detection and/or diagnosis of SARS-CoV-2 by FDA under an Emergency Use Authorization (EUA).  This EUA will remain in effect (meaning this test can be used) for the duration of  the COV                          ID-19 declaration under Section 564(b)(1) of the Act, 21 U.S.C. section 360bbb-3(b)(1), unless the authorization is terminated or revoked sooner.    Admission on 04/21/2021, Discharged on 04/26/2021  Component Date Value Ref Range Status   Color, Urine 04/22/2021 YELLOW  YELLOW Final   APPearance 04/22/2021 HAZY (A)  CLEAR Final  Specific Gravity, Urine 04/22/2021 1.016  1.005 - 1.030 Final   pH 04/22/2021 7.0  5.0 - 8.0 Final   Glucose, UA 04/22/2021 NEGATIVE  NEGATIVE mg/dL Final   Hgb urine dipstick 04/22/2021 NEGATIVE  NEGATIVE Final   Bilirubin Urine 04/22/2021 NEGATIVE  NEGATIVE Final   Ketones, ur 04/22/2021 NEGATIVE  NEGATIVE mg/dL Final   Protein, ur 04/22/2021 NEGATIVE  NEGATIVE mg/dL Final   Nitrite 04/22/2021 NEGATIVE  NEGATIVE Final   Leukocytes,Ua 04/22/2021 NEGATIVE  NEGATIVE Final   Performed at [facility] [address], [place], [place] [address]   Cholesterol 04/24/2021 190  0 - 200 mg/dL Final   Triglycerides 04/24/2021 220 (H)  <150 mg/dL Final   HDL 04/24/2021 44  >40 mg/dL Final   Total CHOL/HDL Ratio 04/24/2021 4.3  RATIO Final   VLDL 04/24/2021 44 (H)  0 - 40 mg/dL Final   LDL Cholesterol 04/24/2021 102 (H)  0 - 99 mg/dL Final   Comment:        Total Cholesterol/HDL:CHD  Risk Coronary Heart Disease Risk Table                     Men   Women  1/2 Average Risk   3.4   3.3  Average Risk       5.0   4.4  2 X Average Risk   9.6   7.1  3 X Average Risk  23.4   11.0        Use the calculated Patient Ratio above and the CHD Risk Table to determine the patient's CHD Risk.        ATP III CLASSIFICATION (LDL):  <100     mg/dL   Optimal  100-129  mg/dL   Near or Above                    Optimal  130-159  mg/dL   Borderline  160-189  mg/dL   High  >190     mg/dL   Very High Performed at [facility] [address], [place], [place] [address]   There may be more visits with results that are not included.    Blood Alcohol level:  Lab Results  Component Value Date   ETH <10 07/01/2021   ETH <10 66/44/0347    Metabolic Disorder Labs: Lab Results  Component Value Date   HGBA1C 5.1 03/02/2021   MPG 99.67 03/02/2021   MPG 96.8 07/02/2019   Lab Results  Component Value Date   PROLACTIN 66.2 (H) 03/02/2021   PROLACTIN 66.1 (H) 02/04/2020   Lab Results  Component Value Date   CHOL 245 (H) 05/01/2021   TRIG 169 (H) 05/01/2021   HDL 46 05/01/2021   CHOLHDL 5.3 05/01/2021   VLDL 34 05/01/2021   LDLCALC 165 (H) 05/01/2021   LDLCALC 102 (H) 04/24/2021    Therapeutic Lab Levels: No results found for: "LITHIUM" Lab Results  Component Value Date   VALPROATE 48 (L) 07/10/2015   VALPROATE 84 05/31/2015   No results found for: "CBMZ"  Physical Findings   AIMS    Flowsheet Row Admission (Discharged) from 07/04/2021 in [facility] Admission (Discharged) from 04/21/2021 in [facility] Admission (Discharged) from 02/04/2020 in [place] 300[facility] Admission (Discharged) from 01/17/2020 in [place] [facility] [facility] Admission (Discharged) from 07/08/2019 in [facility] 300B  AIMS Total Score 5  0 0 0  0      AUDIT    Flowsheet Row Admission (Discharged) from 07/04/2021 in [place] [facility] Admission (Discharged) from 02/04/2020 in [facility] 300B Admission (Discharged) from 01/17/2020 in [facility] 300B Admission (Discharged) from 07/08/2019 in [facility] 300B Admission (Discharged) from 07/01/2019 in [facility] 300B  Alcohol Use Disorder Identification Test Final Score (AUDIT) 0 0 0 0 0      GAD-7    Flowsheet Row Office Visit from 08/20/2016 in [place] for [facility] from 04/03/2016 in [place] for [facility] Routine Prenatal from 03/14/2016 in [place] for [facility] Routine Prenatal from 03/08/2016 in [place] for [facility] Routine Prenatal from 03/01/2016 in [place] for [facility]  Total GAD-7 Score '1 19 13 9 5      '$ PHQ2-9    [facility] ED from 06/26/2021 in [facility] ED from 05/01/2021 in [facility] ED from 02/03/2020 in [facility] ED from 07/26/2019 in [facility] Office Visit from 08/20/2016 in [facility] for [facility]  PHQ-2 Total Score '3 5 6 5 '$ 0  PHQ-9 Total Score '9 17 27 15 1      '$ Flowsheet Row ED from 07/15/2021 in [facility] Admission (Discharged) from 07/04/2021 in [facility] 400B ED to Hosp-Admission (Discharged) from 07/01/2021 in [place] CV PROGRESSIVE CARE  C-SSRS RISK CATEGORY High Risk No Risk High Risk        Musculoskeletal  Strength & Muscle Tone: within normal limits Gait & Station: normal Patient leans: N/A  Psychiatric Specialty Exam  Presentation  General Appearance: Disheveled  Eye  Contact:Fair  Speech:Clear and Coherent (Slightly fast)  Speech Volume:Normal  Handedness:Right   Mood and Affect  Mood:Anxious; Depressed; Irritable; Labile; Worthless  Affect:Depressed; Congruent   Thought Process  Thought Processes:Goal Directed; Linear; Coherent  Descriptions of Associations:Intact  Orientation:Full (Time, Place and Person)  Thought Content:Rumination  Diagnosis of Schizophrenia or Schizoaffective disorder in past: Yes  Duration of Psychotic Symptoms: Greater than six months   Hallucinations:Hallucinations: Auditory Description of Auditory Hallucinations: Lucifer telling me i'm ugly Description of Visual Hallucinations: Denied  Ideas of Reference:None  Suicidal Thoughts:Suicidal Thoughts: Yes, Passive ("don't want to live anymore") SI Active Intent and/or Plan: Without Intent; With Means to Carry Out SI Passive Intent and/or Plan: -- (Per above)  Homicidal Thoughts:Homicidal Thoughts: No HI Active Intent and/or Plan: -- (Denied)   Sensorium  Memory:Immediate Fair  Judgment:Poor  Insight:Poor   Executive Functions  Concentration:Fair  Attention Span:Fair  [place] of Knowledge:Good  Language:Good   Psychomotor Activity  Psychomotor Activity:Psychomotor Activity: Normal   Assets  Assets:Communication Skills; Desire for Improvement; Housing; Intimacy; Social Support; Leisure Time   Sleep  Sleep:Sleep: Poor   Nutritional Assessment (For OBS and FBC admissions only) Has the patient had a weight loss or gain of 10 pounds or more in the last 3 months?: No Has the patient had a decrease in food intake/or appetite?: No Does the patient have dental problems?: No Does the patient have eating habits or behaviors that may be indicators of an eating disorder including binging or inducing vomiting?: No Has the patient recently lost weight without trying?: 0 Has the patient been eating poorly because of a decreased appetite?:  0  Malnutrition Screening Tool Score: 0    Physical Exam  Physical Exam Vitals and nursing note reviewed.  Constitutional:      General: She is not in acute distress.    Appearance: She is not ill-appearing or diaphoretic.  HENT:     Head: Normocephalic and atraumatic.  Pulmonary:     Effort: Pulmonary effort is normal. No respiratory distress.  Neurological:     General: No focal deficit present.     Mental Status: She is alert and oriented to person, place, and time.    Review of Systems  Respiratory:  Negative for shortness of breath.   Cardiovascular:  Negative for chest pain.  Gastrointestinal:  Negative for nausea and vomiting.  Neurological:  Negative for dizziness and headaches.   Blood pressure (!) 85/70, pulse 93, temperature 98.6 F (37 C), temperature source Oral, resp. rate 18, SpO2 99 %. There is no height or weight on file to calculate BMI.  Treatment Plan Summary: Daily contact with patient to assess and evaluate symptoms and progress in treatment and Medication management  Assessment and plan Recurrent SI 2/2 BP1-current depressive episode  Given patient's poor impulse control, outburst, and recent severe suicide attempt, patient is a danger to self and meets criteria for psychiatric hospitalization. Patient was IVC'd for refusing inpatient psych. Recent trigger for SI was feeling alone when no one answered her phone call.  Patient meets requirement for inpatient psych hospitalization IVC'd 07/16/2021 by Merrily Brittle, DO Continued home meds F/u lab workup- patient keeps refusing  Hyperprolactinemia? Patient had prolactin levels in 60s Jan 2023. Will repeat level to trend. However patient keeps refusing labs and vitals. Repeat prolactin level  Merrily Brittle, DO 07/16/2021 4:07 PM

## 2021-07-16 NOTE — Progress Notes (Signed)
Patient asked for clothing from [facility] locker and shower supplies. RN provided patient with supplies and clothing. Patient states, " I agree with going to a different hospital I just need a shower." RN did not mention anything about going to a different hospital to patient.

## 2021-07-16 NOTE — ED Notes (Signed)
Dinner was given to pt

## 2021-07-16 NOTE — Progress Notes (Signed)
Patient continues to be irritable and labile mood changes. Patient took medications, lexapro, gabapentin, and vraylar at 1453 after initially refusing in the morning. Patient refuses lab work. Patient states, "There is no point of me being here I need to go to [facility] or go home." Then walks over to chair and lays down. Nursing staff will continue to monitor patient for safety.

## 2021-07-16 NOTE — ED Notes (Signed)
Pt refused to allow blood draws to be taken because she doesn't want to be stuck.

## 2021-07-16 NOTE — Progress Notes (Signed)
Pt refused all her AM medications. Pt stated " I have meds at home. I am being discharged so I will take them when I get home." Staff will continue to monitor for pt's safety.

## 2021-07-16 NOTE — Progress Notes (Signed)
Patient now resting in chair after speaking with provider, and throwing ice on floor.

## 2021-07-16 NOTE — ED Notes (Signed)
Pt A&O x 4, comfort measures given.  Pt took a shower.  Calm & cooperative at present.  Pt is IVC.  Monitoring for safety.

## 2021-07-16 NOTE — ED Notes (Signed)
Pt refused lunch

## 2021-07-16 NOTE — ED Notes (Signed)
Pt is in the bed sleeping. Respirations are even and unlabored. No acute distress. Will continue to monitor for safety.

## 2021-07-16 NOTE — Progress Notes (Signed)
Patient hitting on windows after being told she would be discharge by provider, and now being told she must wait on another provider. Patient has been told to stop, now banging on the door, and pulling top off the fire alarm. Patient hitting the ice machine. Provider informed.

## 2021-07-16 NOTE — ED Notes (Signed)
Pt admitted to [facility] endorsing SI, HI, AVH, and anxiety. Patient was cooperative during the admission assessment. Skin assessment complete. Patient oriented to unit and unit rules. Meal and drinks offered to patient.  Patient verbalized agreement to treatment plans. Will monitor for safety.

## 2021-07-16 NOTE — ED Notes (Signed)
Patient was asked to get vitals 3 times. Parient rolled over and stated"leave me alone I'm tired".

## 2021-07-16 NOTE — ED Notes (Signed)
Pt is in the bed sleeping. Respirations are even and unlabored. No acute distress noted. Will continue to monitor for safety. 

## 2021-07-17 ENCOUNTER — Ambulatory Visit: Payer: PPO | Admitting: Obstetrics and Gynecology

## 2021-07-17 DIAGNOSIS — T1491XA Suicide attempt, initial encounter: Secondary | ICD-10-CM

## 2021-07-17 DIAGNOSIS — F315 Bipolar disorder, current episode depressed, severe, with psychotic features: Secondary | ICD-10-CM | POA: Diagnosis not present

## 2021-07-17 DIAGNOSIS — R45851 Suicidal ideations: Secondary | ICD-10-CM | POA: Diagnosis not present

## 2021-07-17 DIAGNOSIS — F603 Borderline personality disorder: Secondary | ICD-10-CM | POA: Diagnosis not present

## 2021-07-17 DIAGNOSIS — Z124 Encounter for screening for malignant neoplasm of cervix: Secondary | ICD-10-CM

## 2021-07-17 DIAGNOSIS — F411 Generalized anxiety disorder: Secondary | ICD-10-CM | POA: Diagnosis not present

## 2021-07-17 MED ORDER — TRIPLE ANTIBIOTIC 3.5-400-5000 EX OINT: 1.0000 "application " | TOPICAL_OINTMENT | Freq: Two times a day (BID) | CUTANEOUS | 0 refills | Status: DC | PRN

## 2021-07-17 MED ORDER — THIAMINE HCL 100 MG PO TABS: 100.0000 mg | ORAL_TABLET | Freq: Every day | ORAL | 0 refills | Status: DC

## 2021-07-17 MED ORDER — ADULT MULTIVITAMIN W/MINERALS CH: 1.0000 | ORAL_TABLET | Freq: Every day | ORAL | 0 refills | Status: DC

## 2021-07-17 MED ORDER — HYDROXYZINE HCL 25 MG PO TABS: 25.0000 mg | ORAL_TABLET | Freq: Three times a day (TID) | ORAL | 0 refills | Status: DC | PRN

## 2021-07-17 NOTE — ED Provider Notes (Signed)
FBC/OBS ASAP Discharge Summary  Date and Time: 07/17/2021 8:50 AM  Name: [name]  MRN:  [id number]   Discharge Diagnoses:  Final diagnoses:  Bipolar I disorder, current or most recent episode depressed, with psychotic features ([place])  Borderline personality disorder ([place])  Suicidal ideation  GAD (generalized anxiety disorder)  Suicide attempt ([place])    Subjective: Patient seen by this MD. At time of discharge, consistently refuted any suicidal ideation, intention or plan, denies any Self harm urges. Denies any A/VH and no delusions were elicited and does not seem to be responding to internal stimuli.  Patient states she slept well last night and reports stable appetite.  She denies any physical symptoms today.  During assessment the patient is able to verbalize safety plan to use on return home.  She states she lives with her ex but her ex has gone to [place] for few days.  Patient verbalizes intent to be compliant with medication and outpatient services.   Stay Summary: [name] is a 31 y/o female with a history of bipolar disorder, intellectual disability, autism, depression, and borderline personality disorder presenting to [facility] via [facility] on 07/15/21. Pt has had 19 ED visits in the last 6 months with similar complaints. Patient reports that she has been experiencing AVH. Pt was admitted to the observation unit at [facility]. Pt was restarted on  her home meds. Pt was reevaluated this morning. Today, she reported improved mood and anxiety and denied SI, HI and AVH. Denied any side effects from medications. She contracted for safety. Pt states she feels safe going home today. Safety planning done with Pt.  Pt will follow up with her outpatient psychiatrist and therapist.  Total Time spent with patient: 30 minutes  Past Psychiatric History: [facility] 5/30-6/6. history of bipolar disorder, intellectual disability, autism, depression, and borderline personality disorder presenting to [facility]  via [facility] on 07/15/21. Pt has had 19 ED visits in the last 6 months with similar complaints. Outpatient psychiatry and therapy at [facility] in [place], [place]. PCP at [facility]   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 fragile X syndrome    Chronic constipation    Depression    Drug-seeking behavior    Essential tremor    Headache    Overdose of acetaminophen 07/2017   and other meds   Personality disorder ([place])    Schizo-affective psychosis ([place])    Schizoaffective disorder, bipolar type ([place])    Seizures ([place])    Last seizure December 2017   Sleep apnea     Past Surgical History:  Procedure Laterality Date   MOUTH SURGERY  2009 or 2010   Family History:  Family History  Problem Relation Age of Onset   Mental illness Father    Asthma Father    PDD Brother    Seizures Brother    Family Psychiatric History: Father-mental illness Social History:  Social History   Substance and Sexual Activity  Alcohol Use No   Alcohol/week: 1.0 standard drink of alcohol   Types: 1 Standard drinks or equivalent per week   Comment: denies at this time     Social History   Substance and Sexual Activity  Drug Use No   Comment: History of cocaine use at age 48 for 85 months  Social History   Socioeconomic History   Marital status: Widowed    Spouse name: Not on file   Number of children: 0   Years of education: Not on file   Highest education level: Not on file  Occupational History   Occupation: disability  Tobacco Use   Smoking status: Former    Packs/day: 0.00    Types: Cigarettes   Smokeless tobacco: Never   Tobacco comments:    Smoked for 2  years age [age]  Vaping Use   Vaping Use: Never used  Substance and Sexual Activity   Alcohol use: No    Alcohol/week: 1.0 standard drink of alcohol    Types: 1 Standard drinks  or equivalent per week    Comment: denies at this time   Drug use: No    Comment: History of cocaine use at age 25 for 4 months   Sexual activity: Not Currently    Birth control/protection: None  Other Topics Concern   Not on file  Social History Narrative   Marital status: Widowed      Children: daughter      Lives: with boyfriend, in two story home      Employment:  Disability      Tobacco: quit smoking; smoked for two years.      Alcohol ;none      Drugs: none   Has not traveled outside of the country.   Right handed         Social Determinants of Health   Financial Resource Strain: Not on file  Food Insecurity: Not on file  Transportation Needs: Not on file  Physical Activity: Not on file  Stress: Not on file  Social Connections: Not on file   SDOH:  SDOH Screenings   Alcohol Screen: Low Risk  (07/05/2021)   Alcohol Screen    Last Alcohol Screening Score (AUDIT): 0  Depression (PHQ2-9): Medium Risk (06/27/2021)   Depression (PHQ2-9)    PHQ-2 Score: 9  Financial Resource Strain: Not on file  Food Insecurity: Not on file  Housing: Not on file  Physical Activity: Not on file  Social Connections: Not on file  Stress: Not on file  Tobacco Use: Medium Risk (07/05/2021)   Patient History    Smoking Tobacco Use: Former    Smokeless Tobacco Use: Never    Passive Exposure: Not on file  Transportation Needs: Not on file    Tobacco Cessation:  N/A, patient does not currently use tobacco products  Current Medications:  Current Facility-Administered Medications  Medication Dose Route Frequency Provider Last Rate Last Admin   albuterol (VENTOLIN HFA) 108 (90 Base) MCG/ACT inhaler 2 puff  2 puff Inhalation Q6H PRN Bobbitt, Shalon E, NP       alum & mag hydroxide-simeth (MAALOX/MYLANTA) 200-200-20 MG/5ML suspension 30 mL  30 mL Oral Q4H PRN Bobbitt, Shalon E, NP       cariprazine (VRAYLAR) capsule 3 mg  3 mg Oral Daily Bobbitt, Shalon E, NP   3 mg at 07/16/21 1453    diclofenac Sodium (VOLTAREN) 1 % topical gel 2 g  2 g Topical QID Bobbitt, Shalon E, NP   2 g at 07/17/21 0801   escitalopram (LEXAPRO) tablet 10 mg  10 mg Oral Daily Bobbitt, Shalon E, NP   10 mg at 07/16/21 1455   gabapentin (NEURONTIN) capsule 300 mg  300 mg Oral TID Bobbitt, Shalon E, NP   300 mg at 07/16/21 2118   hydrOXYzine (ATARAX) tablet 25 mg  25 mg Oral TID PRN Merrily Brittle, DO   25 mg at 07/16/21 2117   hydrOXYzine (ATARAX) tablet 25 mg  25 mg Oral Q6H PRN Merrily Brittle, DO       loperamide (IMODIUM) capsule 2-4 mg  2-4 mg Oral PRN Merrily Brittle, DO       LORazepam (ATIVAN) tablet 1 mg  1 mg Oral Q6H PRN Merrily Brittle, DO       magnesium hydroxide (MILK OF MAGNESIA) suspension 30 mL  30 mL Oral Daily PRN Bobbitt, Shalon E, NP   30 mL at 07/17/21 0145   melatonin tablet 3 mg  3 mg Oral QHS Bobbitt, Shalon E, NP   3 mg at 07/16/21 2117   mometasone-formoterol (DULERA) 200-5 MCG/ACT inhaler 2 puff  2 puff Inhalation BID Merrily Brittle, DO   2 puff at 07/17/21 0801   multivitamin with minerals tablet 1 tablet  1 tablet Oral Daily Merrily Brittle, DO   1 tablet at 07/16/21 2145   neomycin-bacitracin-polymyxin [phone] OINT 1 application   1 application  Topical BID PRN Bobbitt, Shalon E, NP   1 application  at 84/69/62 2017   OLANZapine zydis (ZYPREXA) disintegrating tablet 10 mg  10 mg Oral BID PRN Merrily Brittle, DO       Or   OLANZapine (ZYPREXA) injection 10 mg  10 mg Intramuscular BID PRN Merrily Brittle, DO       ondansetron (ZOFRAN-ODT) disintegrating tablet 4 mg  4 mg Oral Q6H PRN Merrily Brittle, DO       pantoprazole (PROTONIX) EC tablet 40 mg  40 mg Oral Daily Bobbitt, Shalon E, NP   40 mg at 07/16/21 2121   propranolol (INDERAL) tablet 10 mg  10 mg Oral BID Bobbitt, Shalon E, NP   10 mg at 07/16/21 2117   QUEtiapine (SEROQUEL) tablet 50 mg  50 mg Oral QHS Bobbitt, Shalon E, NP   50 mg at 07/16/21 2118   thiamine tablet 100 mg  100 mg Oral Daily Merrily Brittle, DO       traZODone  (DESYREL) tablet 50 mg  50 mg Oral QHS PRN Bobbitt, Shalon E, NP       Current Outpatient Medications  Medication Sig Dispense Refill   albuterol (VENTOLIN HFA) 108 (90 Base) MCG/ACT inhaler Inhale 2 puffs into the lungs every 6 (six) hours as needed for wheezing or shortness of breath.     budesonide-formoterol (SYMBICORT) 160-4.5 MCG/ACT inhaler Inhale 2 puffs into the lungs 2 (two) times daily.     calcium carbonate (TUMS - DOSED IN MG ELEMENTAL CALCIUM) 500 MG chewable tablet Chew 1 tablet by mouth daily as needed for indigestion or heartburn.     cariprazine (VRAYLAR) 3 MG capsule Take 1 capsule (3 mg total) by mouth daily. 30 capsule    diclofenac Sodium (VOLTAREN) 1 % GEL Apply 2 g topically 4 (four) times daily. 100 g 0   escitalopram (LEXAPRO) 10 MG tablet Take 1 tablet (10 mg total) by mouth daily.     gabapentin (NEURONTIN) 300 MG capsule Take 1 capsule (300 mg total) by mouth 3 (three) times daily for 7 days. 21 capsule 0   hydrOXYzine (ATARAX) 25 MG tablet Take 1 tablet (25 mg total) by mouth 3 (three) times daily as needed for anxiety. 30 tablet 0   melatonin 3 MG TABS tablet Take 3 mg by mouth at bedtime.     Multiple Vitamin (MULTIVITAMIN WITH MINERALS) TABS tablet Take 1 tablet by mouth daily. [place]  tablet 0   neomycin-bacitracin-polymyxin [phone] OINT Apply 1 application  topically 2 (two) times daily as needed for up to 5 days (apply to scratches on bilateral arms). 9.4 g 0   pantoprazole (PROTONIX) 40 MG tablet TAKE 1 TABLET BY MOUTH DAILY (Patient taking differently: 40 mg daily.) 30 tablet 0   propranolol (INDERAL) 10 MG tablet Take 1 tablet (10 mg total) by mouth 2 (two) times daily.     QUEtiapine (SEROQUEL) 50 MG tablet Take 1 tablet (50 mg total) by mouth at bedtime for 7 days. 7 tablet 0   thiamine 100 MG tablet Take 1 tablet (100 mg total) by mouth daily. 30 tablet 0   traZODone (DESYREL) 50 MG tablet Take 1 tablet (50 mg total) by mouth at bedtime as needed for  sleep.      PTA Medications: (Not in a hospital admission)      06/27/2021    3:18 AM 05/01/2021    2:16 AM 02/03/2020    2:24 AM  Depression screen PHQ 2/9  Decreased Interest '1 2 3  '$ Down, Depressed, Hopeless '2 3 3  '$ PHQ - 2 Score '3 5 6  '$ Altered sleeping 0 0 3  Tired, decreased energy '1 1 3  '$ Change in appetite 0 1 3  Feeling bad or failure about yourself  '1 3 3  '$ Trouble concentrating '2 3 3  '$ Moving slowly or fidgety/restless '1 1 3  '$ Suicidal thoughts '1 3 3  '$ PHQ-9 Score '9 17 27  '$ Difficult doing work/chores Somewhat difficult Extremely dIfficult     Flowsheet Row ED from 07/15/2021 in [facility] Admission (Discharged) from 07/04/2021 in [place] [facility] ED to Hosp-Admission (Discharged) from 07/01/2021 in [place] CATEGORY High Risk No Risk High Risk       Musculoskeletal  Strength & Muscle Tone: within normal limits Gait & Station: normal Patient leans: N/A  Psychiatric Specialty Exam  Presentation  General Appearance: Casual  Eye Contact:Fair  Speech:Clear and Coherent  Speech Volume:Normal  Handedness:Right   Mood and Affect  Mood:Euthymic  Affect:Congruent   Thought Process  Thought Processes:Goal Directed; Linear  Descriptions of Associations:Intact  Orientation:Full (Time, Place and Person)  Thought Content:Logical  Diagnosis of Schizophrenia or Schizoaffective disorder in past: Yes  Duration of Psychotic Symptoms: Greater than six months   Hallucinations:Hallucinations: None Description of Auditory Hallucinations: none Description of Visual Hallucinations: none  Ideas of Reference:None  Suicidal Thoughts:Suicidal Thoughts: No SI Active Intent and/or Plan: -- (none) SI Passive Intent and/or Plan: -- (none today)  Homicidal Thoughts:Homicidal Thoughts: No HI Active Intent and/or Plan: -- (none)   Sensorium  Memory:Immediate  Fair  Judgment:Poor  Insight:Poor   Executive Functions  Concentration:Fair  Attention Span:Fair  [place] of Knowledge:Good  Language:Good   Psychomotor Activity  Psychomotor Activity:Psychomotor Activity: Normal   Assets  Assets:Communication Skills; Desire for Improvement; Housing; Intimacy; Social Support   Sleep  Sleep:Sleep: Good   Nutritional Assessment (For OBS and FBC admissions only) Has the patient had a weight loss or gain of 10 pounds or more in the last 3 months?: No Has the patient had a decrease in food intake/or appetite?: No Does the patient have dental problems?: No Does the patient have eating habits or behaviors that may be indicators of an eating disorder including binging or inducing vomiting?: No Has the patient recently lost weight without trying?: 0 Has the patient been eating poorly because of  a decreased appetite?: 0 Malnutrition Screening Tool Score: 0    Physical Exam  Physical Exam Vitals and nursing note reviewed.  Constitutional:      General: She is not in acute distress.    Appearance: Normal appearance. She is not ill-appearing, toxic-appearing or diaphoretic.  HENT:     Head: Normocephalic.  Pulmonary:     Effort: Pulmonary effort is normal.  Neurological:     Mental Status: She is alert and oriented to person, place, and time.    Review of Systems  Constitutional:  Negative for chills and fever.  Respiratory:  Negative for cough and shortness of breath.   Cardiovascular:  Negative for chest pain.  Gastrointestinal:  Negative for abdominal pain, diarrhea, nausea and vomiting.  Neurological:  Negative for dizziness and headaches.  Psychiatric/Behavioral:  Negative for depression, hallucinations and suicidal ideas. The patient is not nervous/anxious and does not have insomnia.    Blood pressure 98/65, pulse 77, temperature 98.2 F (36.8 C), temperature source Oral, resp. rate 18, SpO2 100 %. There is no  height or weight on file to calculate BMI.  Demographic Factors:  Unemployed  Loss Factors: NA  Historical Factors: Prior suicide attempts, Family history of mental illness or substance abuse, and Impulsivity  Risk Reduction Factors:   Living with another person, especially a relative, Positive social support, and Positive therapeutic relationship  Continued Clinical Symptoms:  Bipolar Disorder:   Bipolar I Personality Disorders:   Cluster B Previous Psychiatric Diagnoses and Treatments  Cognitive Features That Contribute To Risk:  Closed-mindedness and Thought constriction (tunnel vision)    Suicide Risk:  Mild:  No Suicidal ideation at discharge.  There are no identifiable plans, no associated intent, mild dysphoria and related symptoms, good self-control (both objective and subjective assessment), few other risk factors, and identifiable protective factors, including available and accessible social support.  Plan Of Care/Follow-up recommendations:  Activity:  As Tolerated Diet:  regular  Disposition: Prescriptions sent to pharmacy at discharge. Patient agreeable to plan.  Given opportunity to ask questions.  Appears to feel comfortable with discharge denies any current suicidal or homicidal thought. Patient is also instructed prior to discharge to: Take all medications as prescribed by his/her mental healthcare provider. Report any adverse effects and or reactions from the medicines to his/her outpatient provider promptly. Patient has been instructed & cautioned: To not engage in alcohol and or illegal drug use while on prescription medicines. In the event of worsening symptoms, patient is instructed to call the crisis hotline, 911 and or go to the nearest ED for appropriate evaluation and treatment of symptoms. To follow-up with his/her primary care provider for your other medical issues, concerns and or health care needs.   Armando Reichert, MD 07/17/2021, 8:50 AM

## 2021-07-17 NOTE — ED Notes (Addendum)
Patient presents with euthymic mood. Tabitha states '' I'm fine now. I really would like for them to reverse the IVC so I can go home. I came in, started taking my meds again and now I would like to go. '' Patient denies any AH or VH and when questioned on inappropriate behavior, as documented this admission patient states '' well you see I have autism and all these lights and noise trigger me. So I started acting up. But I can't help it, it's just all the lights. I did my coping skills last night and I know what to do. I read my bible, and I can go out in the courtyard if I start feeling this way. ''  Patient has longstanding hx of acting out behaviors, manipulative and attention seeking behaviors. Currently the patient is calm and cooperative and reiterated with the patient appropriate behaviors and encouraged to use coping skills instead of self harm, acting out, or destruction of property. Pt verbalized understanding and agreed to do so.  Patient is denying any AH and when specifically asked as pt reported previous AH of ''lucifer'' she states '' oh no, all that's better and gone. '' I'm really fine and I want to go ''  Patient appears in no acute distress with no signs of acute decompensation.  Pt is safe, po fluids given. Educated that providers will round with the patient to discuss plan of care. Will con't to monitor.

## 2021-07-17 NOTE — Progress Notes (Signed)
Pt ordered for discharge. AVS printed and reviewed with patient. Patient denies any acute concerns. NO signs of acute decompensation. Patient denies any SI/HI or A/V Hallucinations. All patients belongings returned to patient. Pt ambulatory out of unit. Escorted to ride home.

## 2021-07-17 NOTE — Discharge Instructions (Addendum)

## 2021-07-18 ENCOUNTER — Emergency Department (HOSPITAL_COMMUNITY)
Admission: EM | Admit: 2021-07-18 | Discharge: 2021-07-19 | Disposition: A | Discharge status: Home / Self Care | Payer: PPO | Attending: Emergency Medicine | Admitting: Emergency Medicine

## 2021-07-18 ENCOUNTER — Other Ambulatory Visit: Payer: Self-pay

## 2021-07-18 DIAGNOSIS — F603 Borderline personality disorder: Secondary | ICD-10-CM | POA: Diagnosis present

## 2021-07-18 DIAGNOSIS — F840 Autistic disorder: Secondary | ICD-10-CM | POA: Diagnosis not present

## 2021-07-18 DIAGNOSIS — T471X2A Poisoning by other antacids and anti-gastric-secretion drugs, intentional self-harm, initial encounter: Secondary | ICD-10-CM | POA: Diagnosis not present

## 2021-07-18 DIAGNOSIS — F32A Depression, unspecified: Secondary | ICD-10-CM | POA: Diagnosis not present

## 2021-07-18 DIAGNOSIS — Z7289 Other problems related to lifestyle: Secondary | ICD-10-CM

## 2021-07-18 DIAGNOSIS — I10 Essential (primary) hypertension: Secondary | ICD-10-CM | POA: Diagnosis not present

## 2021-07-18 DIAGNOSIS — X781XXA Intentional self-harm by knife, initial encounter: Secondary | ICD-10-CM | POA: Insufficient documentation

## 2021-07-18 DIAGNOSIS — T1491XA Suicide attempt, initial encounter: Secondary | ICD-10-CM | POA: Diagnosis not present

## 2021-07-18 DIAGNOSIS — F250 Schizoaffective disorder, bipolar type: Secondary | ICD-10-CM | POA: Insufficient documentation

## 2021-07-18 DIAGNOSIS — Z20822 Contact with and (suspected) exposure to covid-19: Secondary | ICD-10-CM | POA: Diagnosis not present

## 2021-07-18 DIAGNOSIS — T426X2A Poisoning by other antiepileptic and sedative-hypnotic drugs, intentional self-harm, initial encounter: Secondary | ICD-10-CM | POA: Diagnosis not present

## 2021-07-18 DIAGNOSIS — R45851 Suicidal ideations: Secondary | ICD-10-CM

## 2021-07-18 DIAGNOSIS — S1191XA Laceration without foreign body of unspecified part of neck, initial encounter: Secondary | ICD-10-CM | POA: Insufficient documentation

## 2021-07-18 DIAGNOSIS — G43909 Migraine, unspecified, not intractable, without status migrainosus: Secondary | ICD-10-CM | POA: Diagnosis not present

## 2021-07-18 DIAGNOSIS — S1181XA Laceration without foreign body of other specified part of neck, initial encounter: Secondary | ICD-10-CM | POA: Diagnosis not present

## 2021-07-18 DIAGNOSIS — R9431 Abnormal electrocardiogram [ECG] [EKG]: Secondary | ICD-10-CM | POA: Diagnosis not present

## 2021-07-18 DIAGNOSIS — F845 Asperger's syndrome: Secondary | ICD-10-CM | POA: Diagnosis present

## 2021-07-18 DIAGNOSIS — Z7951 Long term (current) use of inhaled steroids: Secondary | ICD-10-CM | POA: Diagnosis not present

## 2021-07-18 DIAGNOSIS — F4310 Post-traumatic stress disorder, unspecified: Secondary | ICD-10-CM | POA: Diagnosis not present

## 2021-07-18 DIAGNOSIS — Z9152 Personal history of nonsuicidal self-harm: Secondary | ICD-10-CM | POA: Insufficient documentation

## 2021-07-18 DIAGNOSIS — Z79899 Other long term (current) drug therapy: Secondary | ICD-10-CM | POA: Diagnosis not present

## 2021-07-18 DIAGNOSIS — Z818 Family history of other mental and behavioral disorders: Secondary | ICD-10-CM | POA: Diagnosis not present

## 2021-07-18 DIAGNOSIS — J4520 Mild intermittent asthma, uncomplicated: Secondary | ICD-10-CM | POA: Diagnosis not present

## 2021-07-18 DIAGNOSIS — S199XXA Unspecified injury of neck, initial encounter: Secondary | ICD-10-CM | POA: Diagnosis present

## 2021-07-18 DIAGNOSIS — F4325 Adjustment disorder with mixed disturbance of emotions and conduct: Secondary | ICD-10-CM | POA: Diagnosis not present

## 2021-07-18 DIAGNOSIS — Z91030 Bee allergy status: Secondary | ICD-10-CM | POA: Diagnosis not present

## 2021-07-18 DIAGNOSIS — E669 Obesity, unspecified: Secondary | ICD-10-CM | POA: Diagnosis not present

## 2021-07-18 DIAGNOSIS — I959 Hypotension, unspecified: Secondary | ICD-10-CM | POA: Diagnosis not present

## 2021-07-18 DIAGNOSIS — Z888 Allergy status to other drugs, medicaments and biological substances status: Secondary | ICD-10-CM | POA: Diagnosis not present

## 2021-07-18 DIAGNOSIS — D638 Anemia in other chronic diseases classified elsewhere: Secondary | ICD-10-CM | POA: Diagnosis not present

## 2021-07-18 DIAGNOSIS — Z765 Malingerer [conscious simulation]: Secondary | ICD-10-CM | POA: Insufficient documentation

## 2021-07-18 DIAGNOSIS — F329 Major depressive disorder, single episode, unspecified: Secondary | ICD-10-CM | POA: Insufficient documentation

## 2021-07-18 DIAGNOSIS — T43592A Poisoning by other antipsychotics and neuroleptics, intentional self-harm, initial encounter: Secondary | ICD-10-CM | POA: Diagnosis not present

## 2021-07-18 DIAGNOSIS — Z885 Allergy status to narcotic agent status: Secondary | ICD-10-CM | POA: Diagnosis not present

## 2021-07-18 DIAGNOSIS — F22 Delusional disorders: Secondary | ICD-10-CM | POA: Diagnosis not present

## 2021-07-18 DIAGNOSIS — Z825 Family history of asthma and other chronic lower respiratory diseases: Secondary | ICD-10-CM | POA: Diagnosis not present

## 2021-07-18 DIAGNOSIS — F411 Generalized anxiety disorder: Secondary | ICD-10-CM | POA: Insufficient documentation

## 2021-07-18 DIAGNOSIS — T43222A Poisoning by selective serotonin reuptake inhibitors, intentional self-harm, initial encounter: Secondary | ICD-10-CM | POA: Diagnosis not present

## 2021-07-18 DIAGNOSIS — G40909 Epilepsy, unspecified, not intractable, without status epilepticus: Secondary | ICD-10-CM | POA: Diagnosis not present

## 2021-07-18 LAB — COMPREHENSIVE METABOLIC PANEL
ALT: 16 U/L (ref 0–44)
AST: 16 U/L (ref 15–41)
Albumin: 3.9 g/dL (ref 3.5–5.0)
Alkaline Phosphatase: 56 U/L (ref 38–126)
Anion gap: 7 (ref 5–15)
BUN: 15 mg/dL (ref 6–20)
CO2: 22 mmol/L (ref 22–32)
Calcium: 9.2 mg/dL (ref 8.9–10.3)
Chloride: 109 mmol/L (ref 98–111)
Creatinine, Ser: 0.6 mg/dL (ref 0.44–1.00)
GFR, Estimated: >60 mL/min (ref >60)
Glucose, Bld: 99 mg/dL (ref 70–99)
Potassium: 3.8 mmol/L (ref 3.5–5.1)
Sodium: 138 mmol/L (ref 135–145)
Total Bilirubin: 0.3 mg/dL (ref 0.3–1.2)
Total Protein: 6.8 g/dL (ref 6.5–8.1)

## 2021-07-18 LAB — I-STAT BETA HCG BLOOD, ED (MC, WL, AP ONLY): I-stat hCG, quantitative: <5 m[IU]/mL (ref <5)

## 2021-07-18 LAB — CBC WITH DIFFERENTIAL/PLATELET
Abs Immature Granulocytes: 0.04 10*3/uL (ref 0.00–0.07)
Basophils Absolute: 0 10*3/uL (ref 0.0–0.1)
Basophils Relative: 1 %
Eosinophils Absolute: 0 10*3/uL (ref 0.0–0.5)
Eosinophils Relative: 1 %
HCT: 37.5 % (ref 36.0–46.0)
Hemoglobin: 11.7 g/dL — ABNORMAL LOW (ref 12.0–15.0)
Immature Granulocytes: 1 %
Lymphocytes Relative: 34 %
Lymphs Abs: 2.8 10*3/uL (ref 0.7–4.0)
MCH: 26.2 pg (ref 26.0–34.0)
MCHC: 31.2 g/dL (ref 30.0–36.0)
MCV: 84.1 fL (ref 80.0–100.0)
Monocytes Absolute: 0.4 10*3/uL (ref 0.1–1.0)
Monocytes Relative: 5 %
Neutro Abs: 5 10*3/uL (ref 1.7–7.7)
Neutrophils Relative %: 58 %
Platelets: 239 10*3/uL (ref 150–400)
RBC: 4.46 MIL/uL (ref 3.87–5.11)
RDW: 14.3 % (ref 11.5–15.5)
WBC: 8.4 10*3/uL (ref 4.0–10.5)
nRBC: 0 % (ref 0.0–0.2)

## 2021-07-18 LAB — RAPID URINE DRUG SCREEN, HOSP PERFORMED
Amphetamines: NOT DETECTED
Barbiturates: NOT DETECTED
Benzodiazepines: NOT DETECTED
Cocaine: NOT DETECTED
Opiates: NOT DETECTED
Tetrahydrocannabinol: NOT DETECTED

## 2021-07-18 LAB — ETHANOL: Alcohol, Ethyl (B): <10 mg/dL (ref <10)

## 2021-07-18 LAB — RESP PANEL BY RT-PCR (FLU A&B, COVID) ARPGX2
Influenza A by PCR: NEGATIVE
Influenza B by PCR: NEGATIVE
SARS Coronavirus 2 by RT PCR: NEGATIVE

## 2021-07-18 MED ORDER — IBUPROFEN 800 MG PO TABS
800.0000 mg | ORAL_TABLET | Freq: Once | ORAL | Status: AC
  Administered 2021-07-18: 800 mg via ORAL
  Filled 2021-07-18: qty 1

## 2021-07-18 MED ORDER — QUETIAPINE FUMARATE 50 MG PO TABS
50.0000 mg | ORAL_TABLET | Freq: Every day | ORAL | Status: DC
  Administered 2021-07-18: 50 mg via ORAL
  Filled 2021-07-18: qty 1

## 2021-07-18 MED ORDER — CARIPRAZINE HCL 1.5 MG PO CAPS
3.0000 mg | ORAL_CAPSULE | Freq: Every day | ORAL | Status: DC
  Administered 2021-07-18 – 2021-07-19 (×2): 3 mg via ORAL
  Filled 2021-07-18 (×2): qty 2
  Filled 2021-07-18: qty 1
  Filled 2021-07-18: qty 2

## 2021-07-18 MED ORDER — LORAZEPAM 1 MG PO TABS
1.0000 mg | ORAL_TABLET | Freq: Four times a day (QID) | ORAL | Status: DC | PRN
  Administered 2021-07-18 (×2): 1 mg via ORAL
  Filled 2021-07-18 (×2): qty 1

## 2021-07-18 MED ORDER — HYDROXYZINE HCL 25 MG PO TABS
25.0000 mg | ORAL_TABLET | Freq: Three times a day (TID) | ORAL | Status: DC | PRN
  Administered 2021-07-18 – 2021-07-19 (×2): 25 mg via ORAL
  Filled 2021-07-18 (×2): qty 1

## 2021-07-18 MED ORDER — GABAPENTIN 300 MG PO CAPS
300.0000 mg | ORAL_CAPSULE | Freq: Three times a day (TID) | ORAL | Status: DC
  Administered 2021-07-18 – 2021-07-19 (×3): 300 mg via ORAL
  Filled 2021-07-18 (×3): qty 1

## 2021-07-18 MED ORDER — ESCITALOPRAM OXALATE 10 MG PO TABS
10.0000 mg | ORAL_TABLET | Freq: Every day | ORAL | Status: DC
  Administered 2021-07-18 – 2021-07-19 (×2): 10 mg via ORAL
  Filled 2021-07-18 (×2): qty 1

## 2021-07-18 MED ORDER — DICLOFENAC SODIUM 1 % EX GEL
2.0000 g | Freq: Two times a day (BID) | CUTANEOUS | Status: DC
Start: 2021-07-18 — End: 2021-07-19
  Administered 2021-07-18: 2 g via TOPICAL
  Filled 2021-07-18: qty 100

## 2021-07-18 NOTE — ED Triage Notes (Signed)
Arrived via [facility]  this morning she had a suicide attempt using a dirty steak knife, causing a 4in lac on left side of neck. Attempted suicide attempt last week   pt states that she put lidicaine on neck before using steak knife  no other injuries from today  IV refused, usually needs IV team or a PICC line  Last VS  BP 128/62 HR 86 98%        has bags packed for mental hospital

## 2021-07-18 NOTE — ED Notes (Signed)
Pt very irritable and starting to lash out about medication. EDP notified
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
## 2021-07-18 NOTE — ED Notes (Signed)
Pt appeared at nurses station requesting to make a phone call. Sitter and RN advised her that she needs to calm down before she can have the phone. Pt yelled at both the nurse and sitter. This RN advised the pt to return to her room and no phone privileges right now until she is calm. Pt refused to go back to her room continuing to yell as loud as she could. Security, 2 sitters and RN escorted pt back to her room pt yelled "stop treating me like a child" "don't touch me". When pt returned to her room, she continued to yell, jumped on her bed and started punching the bed.

## 2021-07-18 NOTE — ED Provider Notes (Signed)
[facility] EMERGENCY DEPARTMENT Provider Note   CSN: [id number] Arrival date & time: 07/18/21  3557     History  Chief Complaint  Patient presents with   Suicide Attempt    [name] is a 31 y.o. female.  Pt reports she tried to cut her throat with a knife.  Pt reports she was trying to kill herself.  Pt reports she is depressed. Pt reports she recently was hospitalized for a suicide attempt.   The history is provided by the patient. No language interpreter was used.  Depression This is a new problem. The current episode started yesterday. The problem occurs constantly. The problem has not changed since onset.Nothing aggravates the symptoms. Nothing relieves the symptoms. She has tried nothing for the symptoms. The treatment provided no relief.       Home Medications Prior to Admission medications   Medication Sig Start Date End Date Taking? Authorizing Provider  albuterol (VENTOLIN HFA) 108 (90 Base) MCG/ACT inhaler Inhale 2 puffs into the lungs every 6 (six) hours as needed for wheezing or shortness of breath.   Yes [provider]  BIOTIN PO Take 3 capsules by mouth daily.   Yes [provider]  budesonide-formoterol (SYMBICORT) 160-4.5 MCG/ACT inhaler Inhale 2 puffs into the lungs 2 (two) times daily.   Yes [provider]  calcium carbonate (TUMS EX) 750 MG chewable tablet Chew 1,500 mg by mouth 2 (two) times daily as needed for heartburn.   Yes [provider]  cariprazine (VRAYLAR) 3 MG capsule Take 1 capsule (3 mg total) by mouth daily. 07/12/21  Yes Massengill, Ovid Curd, MD  Cyanocobalamin (B-12 PO) Take 1 tablet by mouth daily.   Yes [provider]  diclofenac Sodium (VOLTAREN) 1 % GEL Apply 2 g topically 4 (four) times daily. 06/23/21  Yes Harris, Abigail, PA-C  escitalopram (LEXAPRO) 10 MG tablet Take 1 tablet (10 mg total) by mouth daily. 07/11/21  Yes Massengill, Ovid Curd, MD  gabapentin  (NEURONTIN) 300 MG capsule Take 1 capsule (300 mg total) by mouth 3 (three) times daily for 7 days. 07/11/21 07/18/21 Yes Massengill, Ovid Curd, MD  hydrOXYzine (ATARAX) 25 MG tablet Take 1 tablet (25 mg total) by mouth 3 (three) times daily as needed for anxiety. 07/17/21  Yes Armando Reichert, MD  ibuprofen (ADVIL) 200 MG tablet Take 600 mg by mouth every 6 (six) hours as needed for headache or mild pain.   Yes [provider]  Lidocaine-Menthol (ICY HOT MAX LIDOCAINE EX) Apply 1 Application topically daily as needed (pain).   Yes [provider]  melatonin 3 MG TABS tablet Take 3 mg by mouth at bedtime.   Yes [provider]  Multiple Vitamin (MULTIVITAMIN WITH MINERALS) TABS tablet Take 1 tablet by mouth daily. 07/17/21  Yes Armando Reichert, MD  neomycin-bacitracin-polymyxin [phone] OINT Apply 1 application  topically 2 (two) times daily as needed for up to 5 days (apply to scratches on bilateral arms). 07/17/21 07/22/21 Yes Doda, Edd Arbour, MD  pantoprazole (PROTONIX) 40 MG tablet TAKE 1 TABLET BY MOUTH DAILY Patient taking differently: 40 mg daily. 04/26/21  Yes Jeanie Sewer, NP  propranolol (INDERAL) 10 MG tablet Take 1 tablet (10 mg total) by mouth 2 (two) times daily. 07/11/21  Yes Massengill, Ovid Curd, MD  QUEtiapine (SEROQUEL) 50 MG tablet Take 1 tablet (50 mg total) by mouth at bedtime for 7 days. 07/11/21 07/18/21 Yes Massengill, Ovid Curd, MD  thiamine 100 MG tablet Take 1 tablet (100 mg total) by  mouth daily. 07/17/21  Yes Armando Reichert, MD  traZODone (DESYREL) 50 MG tablet Take 1 tablet (50 mg total) by mouth at bedtime as needed for sleep. Patient not taking: Reported on 07/18/2021 07/11/21   Janine Limbo, MD      Allergies    Bee venom, Coconut flavor, Geodon [ziprasidone hcl], Haloperidol and related, Lithobid [lithium], Other, Roxicodone [oxycodone], Seroquel [quetiapine], Shellfish allergy, Phenergan [promethazine hcl], Prilosec [omeprazole], Sulfa antibiotics,  Tegretol [carbamazepine], Prozac [fluoxetine], Tape, and Tylenol [acetaminophen]    Review of Systems   Review of Systems  Psychiatric/Behavioral:  Positive for depression.   All other systems reviewed and are negative.   Physical Exam Updated Vital Signs BP 102/73 (BP Location: Left Arm)   Pulse 80   Temp 98.8 F (37.1 C) (Oral)   Resp 16   Ht '5\' 6"'$  (1.676 m)   Wt 93.5 kg   SpO2 97%   BMI 33.27 kg/m  Physical Exam Vitals reviewed.  HENT:     Nose: Nose normal.     Mouth/Throat:     Mouth: Mucous membranes are moist.  Eyes:     Extraocular Movements: Extraocular movements intact.     Pupils: Pupils are equal, round, and reactive to light.  Neck:     Comments: Multiple abrasions left neck,  no deep injury Cardiovascular:     Rate and Rhythm: Normal rate.  Pulmonary:     Effort: Pulmonary effort is normal.  Abdominal:     General: Abdomen is flat.  Musculoskeletal:        General: Swelling present.     Cervical back: Normal range of motion.  Skin:    General: Skin is warm.  Neurological:     General: No focal deficit present.     Mental Status: She is alert.  Psychiatric:        Mood and Affect: Mood normal.     ED Results / Procedures / Treatments   Labs (all labs ordered are listed, but only abnormal results are displayed) Labs Reviewed  CBC WITH DIFFERENTIAL/PLATELET - Abnormal; Notable for the following components:      Result Value   Hemoglobin 11.7 (*)    All other components within normal limits  RESP PANEL BY RT-PCR (FLU A&B, COVID) ARPGX2  COMPREHENSIVE METABOLIC PANEL  ETHANOL  RAPID URINE DRUG SCREEN, HOSP PERFORMED  I-STAT BETA HCG BLOOD, ED ([facility], [facility], [facility] ONLY)    EKG None  Radiology No results found.  Procedures Procedures    Medications Ordered in ED Medications - No data to display  ED Course/ Medical Decision Making/ A&P                           Medical Decision Making Amount and/or Complexity of Data Reviewed Labs:  ordered.  Risk Prescription drug management.   Medical screening labs ordered.  Wound does not require sutures         Final Clinical Impression(s) / ED Diagnoses Final diagnoses:  Laceration of neck, initial encounter  Suicide attempt [facility])  Depression, unspecified depression type    Rx / DC Orders ED Discharge Orders     None         Sidney Ace 07/18/21 1406    Fredia Sorrow, MD 08/05/21 [phone]

## 2021-07-18 NOTE — ED Notes (Signed)
Patient requesting to speak to CSW, SW contacted and will be reaching out to TTS patient soon.

## 2021-07-18 NOTE — ED Provider Notes (Signed)
RN requested for medical team to come reassess the patient because she was scratching her forearm enough to bleed.  I went to see her and she said that she feels as though she is not being helped and that the staff is making fun of her.  She is frustrated that she has not gotten her Vraylar, Voltaren cream, Vistaril and Neurontin.  I spoke about this with the nurse and she is waiting for these medications from the pharmacist.  I will order Voltaren cream for her arthritis.  Superficial abrasions to the left AC, no active bleeding.  Anxious appearing with pressured speech.  Says that she does not need anything other than the medications.   Darliss Ridgel 07/18/21 1531    Teressa Lower, MD 07/18/21 [phone]
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
## 2021-07-19 ENCOUNTER — Encounter (HOSPITAL_COMMUNITY): Payer: Self-pay | Admitting: Registered Nurse

## 2021-07-19 ENCOUNTER — Ambulatory Visit (INDEPENDENT_AMBULATORY_CARE_PROVIDER_SITE_OTHER)
Admission: EM | Admit: 2021-07-19 | Discharge: 2021-07-20 | Disposition: A | Discharge status: Home / Self Care | Payer: PPO | Source: Home / Self Care

## 2021-07-19 DIAGNOSIS — X789XXA Intentional self-harm by unspecified sharp object, initial encounter: Secondary | ICD-10-CM | POA: Insufficient documentation

## 2021-07-19 DIAGNOSIS — F32A Depression, unspecified: Secondary | ICD-10-CM | POA: Insufficient documentation

## 2021-07-19 DIAGNOSIS — Z9152 Personal history of nonsuicidal self-harm: Secondary | ICD-10-CM | POA: Insufficient documentation

## 2021-07-19 DIAGNOSIS — Z765 Malingerer [conscious simulation]: Secondary | ICD-10-CM | POA: Insufficient documentation

## 2021-07-19 DIAGNOSIS — F840 Autistic disorder: Secondary | ICD-10-CM | POA: Insufficient documentation

## 2021-07-19 DIAGNOSIS — Z20822 Contact with and (suspected) exposure to covid-19: Secondary | ICD-10-CM | POA: Insufficient documentation

## 2021-07-19 DIAGNOSIS — S1191XA Laceration without foreign body of unspecified part of neck, initial encounter: Secondary | ICD-10-CM | POA: Insufficient documentation

## 2021-07-19 DIAGNOSIS — R45851 Suicidal ideations: Secondary | ICD-10-CM | POA: Insufficient documentation

## 2021-07-19 DIAGNOSIS — Z7289 Other problems related to lifestyle: Secondary | ICD-10-CM

## 2021-07-19 DIAGNOSIS — F411 Generalized anxiety disorder: Secondary | ICD-10-CM

## 2021-07-19 MED ORDER — ACETAMINOPHEN 325 MG PO TABS: 650.0000 mg | ORAL_TABLET | Freq: Four times a day (QID) | ORAL | Status: DC | PRN

## 2021-07-19 MED ORDER — ALUM & MAG HYDROXIDE-SIMETH 200-200-20 MG/5ML PO SUSP: 30.0000 mL | ORAL | Status: DC | PRN

## 2021-07-19 MED ORDER — MAGNESIUM HYDROXIDE 400 MG/5ML PO SUSP: 30.0000 mL | Freq: Every day | ORAL | Status: DC | PRN

## 2021-07-19 NOTE — ED Provider Notes (Signed)
Emergency Medicine Observation Re-evaluation Note  [name] is a 31 y.o. female, seen on rounds today.  Pt initially presented to the ED for complaints of Suicide Attempt Currently, the patient is sleeping.  Physical Exam  BP 100/60   Pulse 72   Temp 98.6 F (37 C)   Resp 17   Ht '5\' 6"'$  (1.676 m)   Wt 93.5 kg   SpO2 98%   BMI 33.27 kg/m  Physical Exam General: Sleeping Cardiac: Extremities perfused Lungs: Breathing is unlabored Psych: Deferred  ED Course / MDM  EKG:   I have reviewed the labs performed to date as well as medications administered while in observation.  Recent changes in the last 24 hours include patient presented to the ED yesterday following suicide attempt.  Anxious and irritable while in the ED.  TTS has evaluated and recommends inpatient admission.  Plan  Current plan is for psychiatric admission.  Tristin Gladman is not under involuntary commitment.     Godfrey Pick, MD 07/19/21 [phone]

## 2021-07-19 NOTE — ED Notes (Signed)
Pt feeling a bit irritable. Wants to know plan.  I informed her I have  reached out to provider and that provider is working on notes from morning assessments so I can update pt.  Pt seemed to understand that.  I offered PO ativan for her agitation.  She declined.  She asked for a snack.  Saltines and sprite provided.

## 2021-07-19 NOTE — ED Notes (Signed)
Lunch order placed

## 2021-07-19 NOTE — Discharge Instructions (Signed)
For your behavioral health needs you are advised to continue treatment with the [facility]:       [address]      [place], [place] [address]      [phone]

## 2021-07-19 NOTE — Consult Note (Signed)
Telepsych Consultation   Reason for Consult:  Self injurious behavior and suicidal ideation Referring Physician:  S[place]ney Ace Location of Patient: [facility] ED Location of Provider: Other: [facility]  Patient Identification: [name] MRN:  [id number] Principal Diagnosis: Autism Diagnosis:  Principal Problem:   Autism Active Problems:   Borderline personality disorder ([place])   Suicidal ideation   Schizoaffective disorder, bipolar type ([place])   PTSD (post-traumatic stress disorder)   Self-injurious behavior   Total Time spent with patient: 30 minutes  Subjective:   [name] is a 31 y.o. female patient admitted to [facility] ED after presenting via EMS with complaints that she attempted suicide by cutting her neck with dirty steak knife.  Patient has had multiple ED and urgent care visit along with psychiatric hospitalization (10 ED/UC visit and 3 hospitalization) in 6 months.  At least 10 of the visits in the last 4-5 weeks) Discharged from [place] 07/04/21 07/12/21 patient admitted to [facility] accepted to [facility] 07/13/21 bur refused and wanted discharge Back to [place] 07/14/21 to 07/15/21    HPI:  [name], 31 y.o., female patient seen via tele health by this provider, consulted with Dr. Hampton Abbot; and chart reviewed on 07/19/21.  On evaluation [name] reports she was brought back to the hospital as a result of her cutting her neck.  States at the time she was feeling suicidal but the cut was only superficial and did not require sutures.  States her stressor is "life."  When asked what she felt she needed she states "I talked to a lady in [place] and she told me about a form I could get my physician to fill out and it would allow for someone to come sit with me so that I wouldn't feel so lonely while my ex was out of town.  He is suppose to be back today but he will be leaving again in a few days.  I wanted to get the form filled out.  I  have a appointment with my physician tomorrow and suppose to see my tooth doctor today."  Patient stating that her primary stressor is loneliness "When I'm home by myself I get board."  Patient also states that she has a community support team that visits her twice a week and next visit is scheduled for Friday 07/21/21.  States her doctor appointment with PCP is scheduled for 07/20/21 to get forms filled out.  States her ex will be home today at 7:30 pm but will leave again in a few days.   Patient stating that she really wants company so that she won't be alone and board while her ex is out of town.    During evaluation [name] is laying in bed with no noted distress.   She is alert/oriented x 4; calm and cooperative.  Her mood is euthymic with congruent affect.  She is speaking in a clear tone at moderate volume, and normal pace; with good eye contact.  Her thought process is coherent and relevant; There is no indication that she is currently responding to internal/external stimuli or experiencing delusional thought content.  When asked she denies suicidal/self-harm/homicidal ideation, psychosis, and paranoia.  Current insight and is making plans to see doctors and has spoken to case worker related to getting more services in home.    Past Psychiatric History: See below  Risk to Self:  Denies Risk to Others:  Denies Prior Inpatient Therapy:  Yes Prior Outpatient Therapy:  Yes  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 fragile X syndrome    Chronic constipation    Depression    Drug-seeking behavior    Essential tremor    Headache    Overdose of acetaminophen 07/2017   and other meds   Personality disorder ([place])    Schizo-affective psychosis ([place])    Schizoaffective disorder, bipolar type ([place])    Seizures ([place])    Last seizure December 2017   Sleep apnea      Past Surgical History:  Procedure Laterality Date   MOUTH SURGERY  2009 or 2010   Family History:  Family History  Problem Relation Age of Onset   Mental illness Father    Asthma Father    PDD Brother    Seizures Brother    Family Psychiatric  History: See above Social History:  Social History   Substance and Sexual Activity  Alcohol Use No   Alcohol/week: 1.0 standard drink of alcohol   Types: 1 Standard drinks or equivalent per week   Comment: denies at this time     Social History   Substance and Sexual Activity  Drug Use No   Comment: History of cocaine use at age 29 for 4 months    Social History   Socioeconomic History   Marital status: Widowed    Spouse name: Not on file   Number of children: 0   Years of education: Not on file   Highest education level: Not on file  Occupational History   Occupation: disability  Tobacco Use   Smoking status: Former    Packs/day: 0.00    Types: Cigarettes   Smokeless tobacco: Never   Tobacco comments:    Smoked for 2  years age [age]  Vaping Use   Vaping Use: Never used  Substance and Sexual Activity   Alcohol use: No    Alcohol/week: 1.0 standard drink of alcohol    Types: 1 Standard drinks or equivalent per week    Comment: denies at this time   Drug use: No    Comment: History of cocaine use at age 35 for 4 months   Sexual activity: Not Currently    Birth control/protection: None  Other Topics Concern   Not on file  Social History Narrative   Marital status: Widowed      Children: daughter      Lives: with boyfriend, in two story home      Employment:  Disability      Tobacco: quit smoking; smoked for two years.      Alcohol ;none      Drugs: none   Has not traveled outside of the country.   Right handed         Social Determinants of Health   Financial Resource Strain: Not on file  Food Insecurity: Not on file  Transportation Needs: Not on file  Physical Activity: Not on file  Stress: Not on  file  Social Connections: Not on file   Additional Social History:    Allergies:   Allergies  Allergen Reactions   Bee Venom Anaphylaxis   Coconut Flavor Anaphylaxis and Rash   Geodon [Ziprasidone Hcl] Other (See Comments)    Pt states that this medication causes paralysis of the mouth.  Haloperidol And Related Other (See Comments)    Pt states that this medication causes paralysis of the mouth, jaw locks up   Lithobid [Lithium] Other (See Comments)    Seizure-like activity    Other Anaphylaxis and Other (See Comments)    Coconut (fruit) - Anaphylaxis (Pt allergic to both coconut flavor and fruit)   Roxicodone [Oxycodone] Other (See Comments)    Hallucinations    Seroquel [Quetiapine] Other (See Comments)    Severe drowsiness Pt currently taking 50 mg, she is ok with this strength, she does not want higher dose.   Shellfish Allergy Anaphylaxis   Phenergan [Promethazine Hcl] Other (See Comments)    Chest pain     Prilosec [Omeprazole] Nausea And Vomiting   Sulfa Antibiotics Other (See Comments)    Chest pain    Tegretol [Carbamazepine] Nausea And Vomiting   Prozac [Fluoxetine] Other (See Comments)    Increased Depression Suicidal thoughts   Tape Other (See Comments)    Skin tears, can only tolerate paper tape.   Tylenol [Acetaminophen] Other (See Comments)    Unknown reaction    Labs:  Results for orders placed or performed during the hospital encounter of 07/18/21 (from the past 48 hour(s))  Resp Panel by RT-PCR (Flu A&B, Covid) Anterior Nasal Swab     Status: None   Collection Time: 07/18/21  9:11 AM   Specimen: Anterior Nasal Swab  Result Value Ref Range   SARS Coronavirus 2 by RT PCR NEGATIVE NEGATIVE    Comment: (NOTE) SARS-CoV-2 target nucleic acids are NOT DETECTED.  The SARS-CoV-2 RNA is generally detectable in upper respiratory specimens during the acute phase of infection. The lowest concentration of SARS-CoV-2 viral copies this assay can detect  is 138 copies/mL. A negative result does not preclude SARS-Cov-2 infection and should not be used as the sole basis for treatment or other patient management decisions. A negative result may occur with  improper specimen collection/handling, submission of specimen other than nasopharyngeal swab, presence of viral mutation(s) within the areas targeted by this assay, and inadequate number of viral copies(<138 copies/mL). A negative result must be combined with clinical observations, patient history, and epidemiological information. The expected result is Negative.  Fact Sheet for Patients:  EntrepreneurPulse.com.au  Fact Sheet for Healthcare Providers:  IncredibleEmployment.be  This test is no t yet approved or cleared by the [place] FDA and  has been authorized for detection and/or diagnosis of SARS-CoV-2 by FDA under an Emergency Use Authorization (EUA). This EUA will remain  in effect (meaning this test can be used) for the duration of the COVID-19 declaration under Section 564(b)(1) of the Act, 21 U.S.C.section 360bbb-3(b)(1), unless the authorization is terminated  or revoked sooner.       Influenza A by PCR NEGATIVE NEGATIVE   Influenza B by PCR NEGATIVE NEGATIVE    Comment: (NOTE) The Xpert Xpress SARS-CoV-2/FLU/RSV plus assay is intended as an aid in the diagnosis of influenza from Nasopharyngeal swab specimens and should not be used as a sole basis for treatment. Nasal washings and aspirates are unacceptable for Xpert Xpress SARS-CoV-2/FLU/RSV testing.  Fact Sheet for Patients: EntrepreneurPulse.com.au  Fact Sheet for Healthcare Providers: IncredibleEmployment.be  This test is not yet approved or cleared by the [place] FDA and has been authorized for detection and/or diagnosis of SARS-CoV-2 by FDA under an Emergency Use Authorization (EUA). This EUA will remain in effect (meaning this  test can be used) for the duration of the COVID-19 declaration under Section 564(b)(1)  of the Act, 21 U.S.C. section 360bbb-3(b)(1), unless the authorization is terminated or revoked.  Performed at [facility], [place] [address] [place], [place] [address]   Comprehensive metabolic panel     Status: None   Collection Time: 07/18/21  9:20 AM  Result Value Ref Range   Sodium 138 135 - 145 mmol/L   Potassium 3.8 3.5 - 5.1 mmol/L   Chloride 109 98 - 111 mmol/L   CO2 22 22 - 32 mmol/L   Glucose, Bld 99 70 - 99 mg/dL    Comment: Glucose reference range applies only to samples taken after fasting for at least 8 hours.   BUN 15 6 - 20 mg/dL   Creatinine, Ser 0.60 0.44 - 1.00 mg/dL   Calcium 9.2 8.9 - 10.3 mg/dL   Total Protein 6.8 6.5 - 8.1 g/dL   Albumin 3.9 3.5 - 5.0 g/dL   AST 16 15 - 41 U/L   ALT 16 0 - 44 U/L   Alkaline Phosphatase 56 38 - 126 U/L   Total Bilirubin 0.3 0.3 - 1.2 mg/dL   GFR, Estimated >60 >60 mL/min    Comment: (NOTE) Calculated using the CKD-EPI Creatinine Equation (2021)    Anion gap 7 5 - 15    Comment: Performed at [facility] [address], [place], [place] [address]  Ethanol     Status: None   Collection Time: 07/18/21  9:20 AM  Result Value Ref Range   Alcohol, Ethyl (B) <10 <10 mg/dL    Comment: (NOTE) Lowest detectable limit for serum alcohol is 10 mg/dL.  For medical purposes only. Performed at [facility], [place] [address] [place], [place] [address]   CBC with Diff     Status: Abnormal   Collection Time: 07/18/21  9:20 AM  Result Value Ref Range   WBC 8.4 4.0 - 10.5 K/uL   RBC 4.46 3.87 - 5.11 MIL/uL   Hemoglobin 11.7 (L) 12.0 - 15.0 g/dL   HCT 37.5 36.0 - 46.0 %   MCV 84.1 80.0 - 100.0 fL   MCH 26.2 26.0 - 34.0 pg   MCHC 31.2 30.0 - 36.0 g/dL   RDW 14.3 11.5 - 15.5 %   Platelets 239 150 - 400 K/uL    Comment: REPEATED TO VERIFY   nRBC 0.0 0.0 - 0.2 %   Neutrophils Relative % 58 %   Neutro Abs 5.0 1.7 - 7.7 K/uL    Lymphocytes Relative 34 %   Lymphs Abs 2.8 0.7 - 4.0 K/uL   Monocytes Relative 5 %   Monocytes Absolute 0.4 0.1 - 1.0 K/uL   Eosinophils Relative 1 %   Eosinophils Absolute 0.0 0.0 - 0.5 K/uL   Basophils Relative 1 %   Basophils Absolute 0.0 0.0 - 0.1 K/uL   Immature Granulocytes 1 %   Abs Immature Granulocytes 0.04 0.00 - 0.07 K/uL    Comment: Performed at [facility] [address] [place], [place] [address]  Urine rapid drug screen (hosp performed)     Status: None   Collection Time: 07/18/21  9:27 AM  Result Value Ref Range   Opiates NONE DETECTED NONE DETECTED   Cocaine NONE DETECTED NONE DETECTED   Benzodiazepines NONE DETECTED NONE DETECTED   Amphetamines NONE DETECTED NONE DETECTED   Tetrahydrocannabinol NONE DETECTED NONE DETECTED   Barbiturates NONE DETECTED NONE DETECTED    Comment: (NOTE) DRUG SCREEN FOR MEDICAL PURPOSES ONLY.  IF CONFIRMATION IS NEEDED  FOR ANY PURPOSE, NOTIFY LAB WITHIN 5 DAYS.  LOWEST DETECTABLE LIMITS FOR URINE DRUG SCREEN Drug Class                     Cutoff (ng/mL) Amphetamine and metabolites    1000 Barbiturate and metabolites    200 Benzodiazepine                 741 Tricyclics and metabolites     300 Opiates and metabolites        300 Cocaine and metabolites        300 THC                            50 Performed at [facility], [place] [address], [place],  [address]   I-Stat beta hCG blood, ED     Status: None   Collection Time: 07/18/21  9:29 AM  Result Value Ref Range   I-stat hCG, quantitative <5.0 <5 mIU/mL   Comment 3            Comment:   GEST. AGE      CONC.  (mIU/mL)   <=1 WEEK        5 - 50     2 WEEKS       50 - 500     3 WEEKS       100 - 10,000     4 WEEKS     1,000 - 30,000        FEMALE AND NON-PREGNANT FEMALE:     LESS THAN 5 mIU/mL     Medications:  Current Facility-Administered Medications  Medication Dose Route Frequency Provider Last Rate Last Admin   cariprazine (VRAYLAR) capsule 3 mg   3 mg Oral Daily Sofia, Leslie K, PA-C   3 mg at 07/19/21 0950   diclofenac Sodium (VOLTAREN) 1 % topical gel 2 g  2 g Topical BID Redwine, Madison A, PA-C   2 g at 07/18/21 1642   escitalopram (LEXAPRO) tablet 10 mg  10 mg Oral Daily Fransico Meadow, PA-C   10 mg at 07/19/21 0950   gabapentin (NEURONTIN) capsule 300 mg  300 mg Oral TID Sofia, Leslie K, PA-C   300 mg at 07/19/21 0950   hydrOXYzine (ATARAX) tablet 25 mg  25 mg Oral TID PRN Caryl Ada K, PA-C   25 mg at 07/19/21 0950   LORazepam (ATIVAN) tablet 1 mg  1 mg Oral Q6H PRN Sofia, Leslie K, PA-C   1 mg at 07/18/21 2148   QUEtiapine (SEROQUEL) tablet 50 mg  50 mg Oral QHS Sofia, Leslie K, PA-C   50 mg at 07/18/21 2148   Current Outpatient Medications  Medication Sig Dispense Refill   albuterol (VENTOLIN HFA) 108 (90 Base) MCG/ACT inhaler Inhale 2 puffs into the lungs every 6 (six) hours as needed for wheezing or shortness of breath.     BIOTIN PO Take 3 capsules by mouth daily.     budesonide-formoterol (SYMBICORT) 160-4.5 MCG/ACT inhaler Inhale 2 puffs into the lungs 2 (two) times daily.     calcium carbonate (TUMS EX) 750 MG chewable tablet Chew 1,500 mg by mouth 2 (two) times daily as needed for heartburn.     cariprazine (VRAYLAR) 3 MG capsule Take 1 capsule (3 mg total) by mouth daily. 30 capsule    Cyanocobalamin (B-12 PO) Take 1 tablet by mouth daily.     diclofenac  Sodium (VOLTAREN) 1 % GEL Apply 2 g topically 4 (four) times daily. 100 g 0   escitalopram (LEXAPRO) 10 MG tablet Take 1 tablet (10 mg total) by mouth daily.     gabapentin (NEURONTIN) 300 MG capsule Take 1 capsule (300 mg total) by mouth 3 (three) times daily for 7 days. 21 capsule 0   hydrOXYzine (ATARAX) 25 MG tablet Take 1 tablet (25 mg total) by mouth 3 (three) times daily as needed for anxiety. 30 tablet 0   ibuprofen (ADVIL) 200 MG tablet Take 600 mg by mouth every 6 (six) hours as needed for headache or mild pain.     Lidocaine-Menthol (ICY HOT MAX LIDOCAINE  EX) Apply 1 Application topically daily as needed (pain).     melatonin 3 MG TABS tablet Take 3 mg by mouth at bedtime.     Multiple Vitamin (MULTIVITAMIN WITH MINERALS) TABS tablet Take 1 tablet by mouth daily. 30 tablet 0   neomycin-bacitracin-polymyxin [phone] OINT Apply 1 application  topically 2 (two) times daily as needed for up to 5 days (apply to scratches on bilateral arms). 9.4 g 0   pantoprazole (PROTONIX) 40 MG tablet TAKE 1 TABLET BY MOUTH DAILY (Patient taking differently: 40 mg daily.) 30 tablet 0   propranolol (INDERAL) 10 MG tablet Take 1 tablet (10 mg total) by mouth 2 (two) times daily.     QUEtiapine (SEROQUEL) 50 MG tablet Take 1 tablet (50 mg total) by mouth at bedtime for 7 days. 7 tablet 0   thiamine 100 MG tablet Take 1 tablet (100 mg total) by mouth daily. 30 tablet 0   traZODone (DESYREL) 50 MG tablet Take 1 tablet (50 mg total) by mouth at bedtime as needed for sleep. (Patient not taking: Reported on 07/18/2021)      Musculoskeletal: Strength & Muscle Tone: within normal limits Gait & Station: normal Patient leans: N/A          Psychiatric Specialty Exam:  Presentation  General Appearance: Appropriate for Environment  Eye Contact:Good  Speech:Clear and Coherent; Normal Rate  Speech Volume:Normal  Handedness:Right   Mood and Affect  Mood:Euthymic  Affect:Appropriate; Congruent   Thought Process  Thought Processes:Coherent; Goal Directed  Descriptions of Associations:Intact  Orientation:Full (Time, Place and Person)  Thought Content:Logical  History of Schizophrenia/Schizoaffective disorder:Yes  Duration of Psychotic Symptoms:Greater than six months  Hallucinations:Hallucinations: None  Ideas of Reference:None  Suicidal Thoughts:Suicidal Thoughts: No (Denies at this time)  Homicidal Thoughts:Homicidal Thoughts: No   Sensorium  Memory:Immediate Good; Recent Good  Judgment:Intact  Insight:Present; Good (Plans to go to  PCP have form completed that will allow a sitter in home to keep her company.)   Executive Functions  Concentration:Good  Attention Span:Good  [place] of Knowledge:Good  Language:Good   Psychomotor Activity  Psychomotor Activity:Psychomotor Activity: Normal   Assets  Assets:Communication Skills; Desire for Improvement; Financial Resources/Insurance; Housing; Social Support   Sleep  Sleep:Sleep: Good    Physical Exam: Physical Exam Vitals and nursing note reviewed. Exam conducted with a chaperone present.  Constitutional:      General: She is not in acute distress.    Appearance: Normal appearance. She is not ill-appearing.  HENT:     Head: Normocephalic.  Cardiovascular:     Rate and Rhythm: Normal rate.  Pulmonary:     Effort: Pulmonary effort is normal.  Neurological:     Mental Status: She is alert and oriented to person, place, and time.    Review of Systems  Constitutional: Negative.   HENT: Negative.    Eyes: Negative.   Respiratory: Negative.    Cardiovascular: Negative.   Gastrointestinal: Negative.   Genitourinary: Negative.   Musculoskeletal: Negative.   Skin: Negative.   Neurological: Negative.   Endo/Heme/Allergies: Negative.   Psychiatric/Behavioral:  Depression: Stable just lonely. Hallucinations: Denies. Suicidal ideas: Denies at this time. Nervous/anxious: Stable. Insomnia: Stable.    Blood pressure (!) 97/42, pulse 97, temperature 98.5 F (36.9 C), temperature source Oral, resp. rate 16, height '5\' 6"'$  (1.676 m), weight 93.5 kg, SpO2 100 %. Body mass index is 33.27 kg/m.  Assessment: This patient has a significant history of malingering evident by multiple emergency room (ED), urgent care (UC) visits, and psychiatric hospitalizations with similar complaints of suicidal ideation.  Patient has been observed overnight in the ED and UC and there has been no unsafe behavior observed.  Patients' similar complaint of suicidal ideation  is often resolved once she is admitted ED, UC or hospital and not having to go home.  While on unit she demonstrates no unsafe behavior and there is no psychiatric condition which is amendable to inpatient psychiatric hospitalization.  Patients' suicidal ideation is conditional and used as a means of manipulation and/or attention seeking without true intention to harm herself.  Her behaviors are more consistent with someone who is acting in self-preservation, rather than someone who is acutely suicidal.  Given all that is stated above including the fact that the patient shows no signs of mania or psychosis, has a liner, coherent thought process throughout assessment the patient does not meet criteria for inpatient psychiatric hospitalization or further psychiatric evaluation at this time.  Patient would benefit from outpatient psychiatric services following up with her current psychiatric provider and community support team.    Jaonna Lown's ongoing endorsement of suicidal ideation shows clear evidence of secondary gain of unmet needs that is representative of limited and often- maladaptive coping skills and rather than an indicator of imminent risk of death.  Evidence indicates that subsequent suicide attempts by patients who made contingent suicide threats (defined as threatened suicide or exaggerated suicidality) are uncommon in both groups.  Hospitalization should not be used as a substitute for social services, substance abuse treatment, and legal assistance for patients who make contingent suicide threats (Characteristics and six-month outcome of patients who use suicide threats to seek hospital admission.  (1966). Psychiatric Services, 47(8), [phone]. (DOI: 10.1176/ps.47.8.871).   Patient has not benefited from past hospitalization under similar circumstances in terms of suicide risk modification, improvement in mental health, or social conditions.  Patients repeated use of ED, UC, and psychiatric  hospitalization services instead of recommended outpatient follow-up is ineffective in helping patients' improvement.  Psychiatric hospitalization is not indicated, and the patient's refusal of interventions that have been offered by clinical care teams during prior stays in the ED, UC and during psychiatric hospitalization to mitigate risk of self-harm, other than allowing care team to observe.   This provider and Dr. Hampton Abbot have discussed assessment and treatment recommendations with patient.  Further we see no evidence of severe psychosis, cognitive impairment, intoxication, or other condition that prevents the patient from acting under their own choice and volition  Treatment Plan Summary: Plan Psychiatrically clear to follow up with current psychiatric provider.  Social work/TOC to contact community support team to see if can increase home visits to more than twice a week.    Disposition: Patient does not meet criteria for psychiatric inpatient admission. Supportive therapy provided about  ongoing stressors. Refer to IOP. Discussed crisis plan, support from social network, calling 911, coming to the Emergency Department, and calling Suicide Hotline.  This service was provided via telemedicine using a 2-way, interactive audio and video technology.  Names of all persons participating in this telemedicine service and their role in this encounter. Name: Earleen Newport Role: NP  Name: Nanda Quinton Role: Patient  Name:  Role:   Name:  Role:    Secure message sent to patients nurse, social work/TOC, and behavioral health coordinator informing:  Psychiatric consult completed and patient psychiatrically cleared.  Message sent to social work and [facility] coordinator to check with patients' community support team to see if visits can be increased to more than twice week.  Patient can follow up with current psychiatric provider.  At this time, she denies suicidal ideation and states her ex is supposed to  be home tonight.  Patients' main stressor and hospital visits is mainly secondary gain related to not wanting to be home alone.   Reports she has a dental appointment today, a PCP appointment on Thursday (tomorrow) and CST visit on Friday.  Social work/TOC, [facility] coordinator please list resources and follow up on AVS.  Please inform MD only default listed.    Heena Woodbury, NP 07/19/2021 12:38 PM

## 2021-07-19 NOTE — ED Provider Notes (Signed)
[facility] Continuous Assessment Admission H&P  Date: 07/19/21 Patient Name: [name] MRN: [id number] Chief Complaint: No chief complaint on file.     Diagnoses:  Final diagnoses:  Suicidal ideation  Malingering  Generalized anxiety disorder    HPI: [name],  31 y.o female, with a history of depression, suicide ideation, bipolar disorder, ADD, autism.  Presented to [facility] for suicidal ideation.  Per the patient had tried to kill myself Tuesday using a knife to cut my neck, patient does have a small laceration to her the left side of her neck with a bandage on.  Patient does have bruises to her bilateral arms.  Per the patient I do anything right now just to get some help.  Face-to-face observation of patient, patient is alert and oriented x4, speech is clear, maintained minimal eye contact.  Mood is anxious affect flat.  Patient reports SI with active plans to use a knife to cut her throat, per the patient see I started the other day pointing towards her neck.  Patient denies alcohol use, denies illicit drug use.   Recommend inpatient observation  [place] 2-9:  [place] ED from 06/26/2021 in [place] ED from 05/01/2021 in [facility] ED from 02/03/2020 in [facility]  Thoughts that you would be better off dead, or of hurting yourself in some way Several days Nearly every day Nearly every day  [Phreesia 02/03/2020]  PHQ-9 Total Score '9 17 27       '$ Flowsheet Row ED from 07/18/2021 in [place] ED from 07/15/2021 in [facility] Admission (Discharged) from 07/04/2021 in [place] [facility]  C-SSRS RISK CATEGORY High Risk High Risk No Risk        Total Time spent with patient: 30 minutes  Musculoskeletal  Strength & Muscle Tone: within normal limits Gait & Station:  normal Patient leans: N/A  Psychiatric Specialty Exam  Presentation General Appearance: Bizarre  Eye Contact:Fair  Speech:Clear and Coherent  Speech Volume:Normal  Handedness:Ambidextrous   Mood and Affect  Mood:Anxious; Depressed  Affect:Congruent   Thought Process  Thought Processes:Irrevelant  Descriptions of Associations:Loose  Orientation:Full (Time, Place and Person)  Thought Content:WDL  Diagnosis of Schizophrenia or Schizoaffective disorder in past: No  Duration of Psychotic Symptoms: Greater than six months  Hallucinations:Hallucinations: None  Ideas of Reference:None  Suicidal Thoughts:Suicidal Thoughts: Yes, Active SI Active Intent and/or Plan: With Intent; With Plan  Homicidal Thoughts:Homicidal Thoughts: No   Sensorium  Memory:Immediate Fair  Judgment:Poor  Insight:Fair   Executive Functions  Concentration:Fair  Attention Span:Fair  [place]   Psychomotor Activity  Psychomotor Activity:Psychomotor Activity: Normal   Assets  Assets:Desire for Improvement; Armed forces logistics/support/administrative officer; Housing   Sleep  Sleep:Sleep: Fair   Nutritional Assessment (For OBS and FBC admissions only) Has the patient had a weight loss or gain of 10 pounds or more in the last 3 months?: No Has the patient had a decrease in food intake/or appetite?: No Does the patient have dental problems?: No Does the patient have eating habits or behaviors that may be indicators of an eating disorder including binging or inducing vomiting?: No Has the patient recently lost weight without trying?: 0 Has the patient been eating poorly because of a decreased appetite?: 0 Malnutrition Screening Tool Score: 0    Physical Exam HENT:     Head: Normocephalic.  Nose: Nose normal.  Cardiovascular:     Rate and Rhythm: Normal rate.     Pulses: Normal pulses.  Pulmonary:     Effort: Pulmonary effort is normal.   Musculoskeletal:        General: Normal range of motion.     Cervical back: Normal range of motion.  Skin:    General: Skin is warm.  Neurological:     General: No focal deficit present.     Mental Status: She is alert.  Psychiatric:        Mood and Affect: Mood normal.        Behavior: Behavior normal.        Thought Content: Thought content normal.        Judgment: Judgment normal.    Review of Systems  Constitutional: Negative.   HENT: Negative.    Eyes: Negative.   Respiratory: Negative.    Cardiovascular: Negative.   Gastrointestinal: Negative.   Genitourinary: Negative.   Musculoskeletal: Negative.   Skin: Negative.   Neurological: Negative.   Endo/Heme/Allergies: Negative.   Psychiatric/Behavioral:  Positive for suicidal ideas. The patient is nervous/anxious.     Blood pressure 123/77, pulse 88, temperature 98.1 F (36.7 C), temperature source Oral, resp. rate 17, SpO2 99 %. There is no height or weight on file to calculate BMI.  Past Psychiatric History: Depression, autism, bipolar disorder, ADD,  Is the patient at risk to self? Yes  Has the patient been a risk to self in the past 6 months? Yes .    Has the patient been a risk to self within the distant past? Yes   Is the patient a risk to others? No   Has the patient been a risk to others in the past 6 months? No   Has the patient been a risk to others within the distant past? 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    Chronic constipation    Depression    Drug-seeking behavior    Essential tremor    Headache    Overdose of acetaminophen 07/2017   and other meds   Personality disorder ([place])    Schizo-affective psychosis ([place])    Schizoaffective disorder, bipolar type ([place])    Seizures ([place])    Last seizure December 2017   Sleep apnea     Past Surgical  History:  Procedure Laterality Date   MOUTH SURGERY  2009 or 2010    Family History:  Family History  Problem Relation Age of Onset   Mental illness Father    Asthma Father    PDD Brother    Seizures Brother     Social History:  Social History   Socioeconomic History   Marital status: Widowed    Spouse name: Not on file   Number of children: 0   Years of education: Not on file   Highest education level: Not on file  Occupational History   Occupation: disability  Tobacco Use   Smoking status: Former    Packs/day: 0.00    Types: Cigarettes   Smokeless tobacco: Never   Tobacco comments:    Smoked for 2  years age [age]  Vaping Use   Vaping Use: Never used  Substance and Sexual Activity   Alcohol use: No  Alcohol/week: 1.0 standard drink of alcohol    Types: 1 Standard drinks or equivalent per week    Comment: denies at this time   Drug use: No    Comment: History of cocaine use at age 32 for 4 months   Sexual activity: Not Currently    Birth control/protection: None  Other Topics Concern   Not on file  Social History Narrative   Marital status: Widowed      Children: daughter      Lives: with boyfriend, in two story home      Employment:  Disability      Tobacco: quit smoking; smoked for two years.      Alcohol ;none      Drugs: none   Has not traveled outside of the country.   Right handed         Social Determinants of Health   Financial Resource Strain: Not on file  Food Insecurity: Not on file  Transportation Needs: Not on file  Physical Activity: Not on file  Stress: Not on file  Social Connections: Not on file  Intimate Partner Violence: Not on file    SDOH:  SDOH Screenings   Alcohol Screen: Low Risk  (07/05/2021)   Alcohol Screen    Last Alcohol Screening Score (AUDIT): 0  Depression (PHQ2-9): Medium Risk (06/27/2021)   Depression (PHQ2-9)    PHQ-2 Score: 9  Financial Resource Strain: Not on file  Food Insecurity: Not on file   Housing: Not on file  Physical Activity: Not on file  Social Connections: Not on file  Stress: Not on file  Tobacco Use: Medium Risk (07/19/2021)   Patient History    Smoking Tobacco Use: Former    Smokeless Tobacco Use: Never    Passive Exposure: Not on file  Transportation Needs: Not on file    Last Labs:  Admission on 07/18/2021, Discharged on 07/19/2021  Component Date Value Ref Range Status   SARS Coronavirus 2 by RT PCR 07/18/2021 NEGATIVE  NEGATIVE Final   Comment: (NOTE) SARS-CoV-2 target nucleic acids are NOT DETECTED.  The SARS-CoV-2 RNA is generally detectable in upper respiratory specimens during the acute phase of infection. The lowest concentration of SARS-CoV-2 viral copies this assay can detect is 138 copies/mL. A negative result does not preclude SARS-Cov-2 infection and should not be used as the sole basis for treatment or other patient management decisions. A negative result may occur with  improper specimen collection/handling, submission of specimen other than nasopharyngeal swab, presence of viral mutation(s) within the areas targeted by this assay, and inadequate number of viral copies(<138 copies/mL). A negative result must be combined with clinical observations, patient history, and epidemiological information. The expected result is Negative.  Fact Sheet for Patients:  EntrepreneurPulse.com.au  Fact Sheet for Healthcare Providers:  IncredibleEmployment.be  This test is no                          t yet approved or cleared by the [place] FDA and  has been authorized for detection and/or diagnosis of SARS-CoV-2 by FDA under an Emergency Use Authorization (EUA). This EUA will remain  in effect (meaning this test can be used) for the duration of the COVID-19 declaration under Section 564(b)(1) of the Act, 21 U.S.C.section 360bbb-3(b)(1), unless the authorization is terminated  or revoked sooner.        Influenza A by PCR 07/18/2021 NEGATIVE  NEGATIVE Final   Influenza B by PCR  07/18/2021 NEGATIVE  NEGATIVE Final   Comment: (NOTE) The Xpert Xpress SARS-CoV-2/FLU/RSV plus assay is intended as an aid in the diagnosis of influenza from Nasopharyngeal swab specimens and should not be used as a sole basis for treatment. Nasal washings and aspirates are unacceptable for Xpert Xpress SARS-CoV-2/FLU/RSV testing.  Fact Sheet for Patients: EntrepreneurPulse.com.au  Fact Sheet for Healthcare Providers: IncredibleEmployment.be  This test is not yet approved or cleared by the [place] FDA and has been authorized for detection and/or diagnosis of SARS-CoV-2 by FDA under an Emergency Use Authorization (EUA). This EUA will remain in effect (meaning this test can be used) for the duration of the COVID-19 declaration under Section 564(b)(1) of the Act, 21 U.S.C. section 360bbb-3(b)(1), unless the authorization is terminated or revoked.  Performed at [facility], [place] [address] [place], [place] [address]    Sodium 07/18/2021 138  135 - 145 mmol/L Final   Potassium 07/18/2021 3.8  3.5 - 5.1 mmol/L Final   Chloride 07/18/2021 109  98 - 111 mmol/L Final   CO2 07/18/2021 22  22 - 32 mmol/L Final   Glucose, Bld 07/18/2021 99  70 - 99 mg/dL Final   Glucose reference range applies only to samples taken after fasting for at least 8 hours.   BUN 07/18/2021 15  6 - 20 mg/dL Final   Creatinine, Ser 07/18/2021 0.60  0.44 - 1.00 mg/dL Final   Calcium 07/18/2021 9.2  8.9 - 10.3 mg/dL Final   Total Protein 07/18/2021 6.8  6.5 - 8.1 g/dL Final   Albumin 07/18/2021 3.9  3.5 - 5.0 g/dL Final   AST 07/18/2021 16  15 - 41 U/L Final   ALT 07/18/2021 16  0 - 44 U/L Final   Alkaline Phosphatase 07/18/2021 56  38 - 126 U/L Final   Total Bilirubin 07/18/2021 0.3  0.3 - 1.2 mg/dL Final   GFR, Estimated 07/18/2021 >60  >60 mL/min Final   Comment: (NOTE) Calculated using  the CKD-EPI Creatinine Equation (2021)    Anion gap 07/18/2021 7  5 - 15 Final   Performed at [place] [address], [place], [place] [address]   Alcohol, Ethyl (B) 07/18/2021 <10  <10 mg/dL Final   Comment: (NOTE) Lowest detectable limit for serum alcohol is 10 mg/dL.  For medical purposes only. Performed at [facility], [place] [address] [place], [place] [address]    Opiates 07/18/2021 NONE DETECTED  NONE DETECTED Final   Cocaine 07/18/2021 NONE DETECTED  NONE DETECTED Final   Benzodiazepines 07/18/2021 NONE DETECTED  NONE DETECTED Final   Amphetamines 07/18/2021 NONE DETECTED  NONE DETECTED Final   Tetrahydrocannabinol 07/18/2021 NONE DETECTED  NONE DETECTED Final   Barbiturates 07/18/2021 NONE DETECTED  NONE DETECTED Final   Comment: (NOTE) DRUG SCREEN FOR MEDICAL PURPOSES ONLY.  IF CONFIRMATION IS NEEDED FOR ANY PURPOSE, NOTIFY LAB WITHIN 5 DAYS.  LOWEST DETECTABLE LIMITS FOR URINE DRUG SCREEN Drug Class                     Cutoff (ng/mL) Amphetamine and metabolites    1000 Barbiturate and metabolites    200 Benzodiazepine                 062 Tricyclics and metabolites     300 Opiates and metabolites        300 Cocaine and metabolites        300 THC  50 Performed at [facility], [place] [address], [place], [place] [address]    WBC 07/18/2021 8.4  4.0 - 10.5 K/uL Final   RBC 07/18/2021 4.46  3.87 - 5.11 MIL/uL Final   Hemoglobin 07/18/2021 11.7 (L)  12.0 - 15.0 g/dL Final   HCT 07/18/2021 37.5  36.0 - 46.0 % Final   MCV 07/18/2021 84.1  80.0 - 100.0 fL Final   MCH 07/18/2021 26.2  26.0 - 34.0 pg Final   MCHC 07/18/2021 31.2  30.0 - 36.0 g/dL Final   RDW 07/18/2021 14.3  11.5 - 15.5 % Final   Platelets 07/18/2021 239  150 - 400 K/uL Final   REPEATED TO VERIFY   nRBC 07/18/2021 0.0  0.0 - 0.2 % Final   Neutrophils Relative % 07/18/2021 58  % Final   Neutro Abs 07/18/2021 5.0  1.7 - 7.7 K/uL Final   Lymphocytes  Relative 07/18/2021 34  % Final   Lymphs Abs 07/18/2021 2.8  0.7 - 4.0 K/uL Final   Monocytes Relative 07/18/2021 5  % Final   Monocytes Absolute 07/18/2021 0.4  0.1 - 1.0 K/uL Final   Eosinophils Relative 07/18/2021 1  % Final   Eosinophils Absolute 07/18/2021 0.0  0.0 - 0.5 K/uL Final   Basophils Relative 07/18/2021 1  % Final   Basophils Absolute 07/18/2021 0.0  0.0 - 0.1 K/uL Final   Immature Granulocytes 07/18/2021 1  % Final   Abs Immature Granulocytes 07/18/2021 0.04  0.00 - 0.07 K/uL Final   Performed at [facility], [place] [address] [place], [place] [address]   I-stat hCG, quantitative 07/18/2021 <5.0  <5 mIU/mL Final   Comment 3 07/18/2021          Final   Comment:   GEST. AGE      CONC.  (mIU/mL)   <=1 WEEK        5 - 50     2 WEEKS       50 - 500     3 WEEKS       100 - 10,000     4 WEEKS     1,000 - 30,000        FEMALE AND NON-PREGNANT FEMALE:     LESS THAN 5 mIU/mL   Admission on 07/15/2021, Discharged on 07/17/2021  Component Date Value Ref Range Status   SARS Coronavirus 2 by RT PCR 07/15/2021 NEGATIVE  NEGATIVE Final   Comment: (NOTE) SARS-CoV-2 target nucleic acids are NOT DETECTED.  The SARS-CoV-2 RNA is generally detectable in upper respiratory specimens during the acute phase of infection. The lowest concentration of SARS-CoV-2 viral copies this assay can detect is 138 copies/mL. A negative result does not preclude SARS-Cov-2 infection and should not be used as the sole basis for treatment or other patient management decisions. A negative result may occur with  improper specimen collection/handling, submission of specimen other than nasopharyngeal swab, presence of viral mutation(s) within the areas targeted by this assay, and inadequate number of viral copies(<138 copies/mL). A negative result must be combined with clinical observations, patient history, and epidemiological information. The expected result is Negative.  Fact Sheet for Patients:   EntrepreneurPulse.com.au  Fact Sheet for Healthcare Providers:  IncredibleEmployment.be  This test is no                          t yet approved or cleared by the [place] FDA and  has been  authorized for detection and/or diagnosis of SARS-CoV-2 by FDA under an Emergency Use Authorization (EUA). This EUA will remain  in effect (meaning this test can be used) for the duration of the COVID-19 declaration under Section 564(b)(1) of the Act, 21 U.S.C.section 360bbb-3(b)(1), unless the authorization is terminated  or revoked sooner.       Influenza A by PCR 07/15/2021 NEGATIVE  NEGATIVE Final   Influenza B by PCR 07/15/2021 NEGATIVE  NEGATIVE Final   Comment: (NOTE) The Xpert Xpress SARS-CoV-2/FLU/RSV plus assay is intended as an aid in the diagnosis of influenza from Nasopharyngeal swab specimens and should not be used as a sole basis for treatment. Nasal washings and aspirates are unacceptable for Xpert Xpress SARS-CoV-2/FLU/RSV testing.  Fact Sheet for Patients: EntrepreneurPulse.com.au  Fact Sheet for Healthcare Providers: IncredibleEmployment.be  This test is not yet approved or cleared by the [place] FDA and has been authorized for detection and/or diagnosis of SARS-CoV-2 by FDA under an Emergency Use Authorization (EUA). This EUA will remain in effect (meaning this test can be used) for the duration of the COVID-19 declaration under Section 564(b)(1) of the Act, 21 U.S.C. section 360bbb-3(b)(1), unless the authorization is terminated or revoked.  Performed at [facility], [place] [address] [place], [place] [address]    POC Amphetamine UR 07/15/2021 None Detected  NONE DETECTED (Cut Off Level 1000 ng/mL) Final   POC Secobarbital (BAR) 07/15/2021 None Detected  NONE DETECTED (Cut Off Level 300 ng/mL) Final   POC Buprenorphine (BUP) 07/15/2021 None Detected  NONE DETECTED (Cut Off Level  10 ng/mL) Final   POC Oxazepam (BZO) 07/15/2021 Positive (A)  NONE DETECTED (Cut Off Level 300 ng/mL) Final   POC Cocaine UR 07/15/2021 None Detected  NONE DETECTED (Cut Off Level 300 ng/mL) Final   POC Methamphetamine UR 07/15/2021 None Detected  NONE DETECTED (Cut Off Level 1000 ng/mL) Final   POC Morphine 07/15/2021 None Detected  NONE DETECTED (Cut Off Level 300 ng/mL) Final   POC Methadone UR 07/15/2021 None Detected  NONE DETECTED (Cut Off Level 300 ng/mL) Final   POC Oxycodone UR 07/15/2021 None Detected  NONE DETECTED (Cut Off Level 100 ng/mL) Final   POC Marijuana UR 07/15/2021 None Detected  NONE DETECTED (Cut Off Level 50 ng/mL) Final   SARSCOV2ONAVIRUS 2 AG 07/15/2021 NEGATIVE  NEGATIVE Final   Comment: (NOTE) SARS-CoV-2 antigen NOT DETECTED.   Negative results are presumptive.  Negative results do not preclude SARS-CoV-2 infection and should not be used as the sole basis for treatment or other patient management decisions, including infection  control decisions, particularly in the presence of clinical signs and  symptoms consistent with COVID-19, or in those who have been in contact with the virus.  Negative results must be combined with clinical observations, patient history, and epidemiological information. The expected result is Negative.  Fact Sheet for Patients: HandmadeRecipes.com.cy  Fact Sheet for Healthcare Providers: FuneralLife.at  This test is not yet approved or cleared by the [place] FDA and  has been authorized for detection and/or diagnosis of SARS-CoV-2 by FDA under an Emergency Use Authorization (EUA).  This EUA will remain in effect (meaning this test can be used) for the duration of  the COV                          ID-19 declaration under Section 564(b)(1) of the Act, 21 U.S.C. section 360bbb-3(b)(1), unless the authorization is terminated or revoked sooner.  Preg Test, Ur 07/15/2021 NEGATIVE   NEGATIVE Final   Comment:        THE SENSITIVITY OF THIS METHODOLOGY IS >24 mIU/mL   Admission on 07/12/2021, Discharged on 07/14/2021  Component Date Value Ref Range Status   SARS Coronavirus 2 by RT PCR 07/12/2021 NEGATIVE  NEGATIVE Final   Comment: (NOTE) SARS-CoV-2 target nucleic acids are NOT DETECTED.  The SARS-CoV-2 RNA is generally detectable in upper respiratory specimens during the acute phase of infection. The lowest concentration of SARS-CoV-2 viral copies this assay can detect is 138 copies/mL. A negative result does not preclude SARS-Cov-2 infection and should not be used as the sole basis for treatment or other patient management decisions. A negative result may occur with  improper specimen collection/handling, submission of specimen other than nasopharyngeal swab, presence of viral mutation(s) within the areas targeted by this assay, and inadequate number of viral copies(<138 copies/mL). A negative result must be combined with clinical observations, patient history, and epidemiological information. The expected result is Negative.  Fact Sheet for Patients:  EntrepreneurPulse.com.au  Fact Sheet for Healthcare Providers:  IncredibleEmployment.be  This test is no                          t yet approved or cleared by the [place] FDA and  has been authorized for detection and/or diagnosis of SARS-CoV-2 by FDA under an Emergency Use Authorization (EUA). This EUA will remain  in effect (meaning this test can be used) for the duration of the COVID-19 declaration under Section 564(b)(1) of the Act, 21 U.S.C.section 360bbb-3(b)(1), unless the authorization is terminated  or revoked sooner.       Influenza A by PCR 07/12/2021 NEGATIVE  NEGATIVE Final   Influenza B by PCR 07/12/2021 NEGATIVE  NEGATIVE Final   Comment: (NOTE) The Xpert Xpress SARS-CoV-2/FLU/RSV plus assay is intended as an aid in the diagnosis of influenza  from Nasopharyngeal swab specimens and should not be used as a sole basis for treatment. Nasal washings and aspirates are unacceptable for Xpert Xpress SARS-CoV-2/FLU/RSV testing.  Fact Sheet for Patients: EntrepreneurPulse.com.au  Fact Sheet for Healthcare Providers: IncredibleEmployment.be  This test is not yet approved or cleared by the [place] FDA and has been authorized for detection and/or diagnosis of SARS-CoV-2 by FDA under an Emergency Use Authorization (EUA). This EUA will remain in effect (meaning this test can be used) for the duration of the COVID-19 declaration under Section 564(b)(1) of the Act, 21 U.S.C. section 360bbb-3(b)(1), unless the authorization is terminated or revoked.  Performed at [facility], [place] [address], [place], [place] [address]    POC Amphetamine UR 07/12/2021 None Detected  NONE DETECTED (Cut Off Level 1000 ng/mL) Final   POC Secobarbital (BAR) 07/12/2021 None Detected  NONE DETECTED (Cut Off Level 300 ng/mL) Final   POC Buprenorphine (BUP) 07/12/2021 None Detected  NONE DETECTED (Cut Off Level 10 ng/mL) Final   POC Oxazepam (BZO) 07/12/2021 Positive (A)  NONE DETECTED (Cut Off Level 300 ng/mL) Final   POC Cocaine UR 07/12/2021 None Detected  NONE DETECTED (Cut Off Level 300 ng/mL) Final   POC Methamphetamine UR 07/12/2021 None Detected  NONE DETECTED (Cut Off Level 1000 ng/mL) Final   POC Morphine 07/12/2021 None Detected  NONE DETECTED (Cut Off Level 300 ng/mL) Final   POC Methadone UR 07/12/2021 None Detected  NONE DETECTED (Cut Off Level 300 ng/mL) Final   POC Oxycodone UR 07/12/2021 None Detected  NONE DETECTED (Cut Off Level 100 ng/mL) Final   POC Marijuana UR 07/12/2021 None Detected  NONE DETECTED (Cut Off Level 50 ng/mL) Final   SARS Coronavirus 2 Ag 07/12/2021 Negative  Negative Preliminary   Preg Test, Ur 07/12/2021 NEGATIVE  NEGATIVE Final   Comment:        THE SENSITIVITY OF  THIS METHODOLOGY IS >24 mIU/mL    SARSCOV2ONAVIRUS 2 AG 07/12/2021 NEGATIVE  NEGATIVE Final   Comment: (NOTE) SARS-CoV-2 antigen NOT DETECTED.   Negative results are presumptive.  Negative results do not preclude SARS-CoV-2 infection and should not be used as the sole basis for treatment or other patient management decisions, including infection  control decisions, particularly in the presence of clinical signs and  symptoms consistent with COVID-19, or in those who have been in contact with the virus.  Negative results must be combined with clinical observations, patient history, and epidemiological information. The expected result is Negative.  Fact Sheet for Patients: HandmadeRecipes.com.cy  Fact Sheet for Healthcare Providers: FuneralLife.at  This test is not yet approved or cleared by the [place] FDA and  has been authorized for detection and/or diagnosis of SARS-CoV-2 by FDA under an Emergency Use Authorization (EUA).  This EUA will remain in effect (meaning this test can be used) for the duration of  the COV                          ID-19 declaration under Section 564(b)(1) of the Act, 21 U.S.C. section 360bbb-3(b)(1), unless the authorization is terminated or revoked sooner.    No results displayed because visit has over 200 results.    Admission on 06/26/2021, Discharged on 06/27/2021  Component Date Value Ref Range Status   Glucose-Capillary 06/26/2021 100 (H)  70 - 99 mg/dL Final   Glucose reference range applies only to samples taken after fasting for at least 8 hours.   Sodium 06/26/2021 138  135 - 145 mmol/L Final   Potassium 06/26/2021 4.1  3.5 - 5.1 mmol/L Final   Chloride 06/26/2021 109  98 - 111 mmol/L Final   CO2 06/26/2021 22  22 - 32 mmol/L Final   Glucose, Bld 06/26/2021 111 (H)  70 - 99 mg/dL Final   Glucose reference range applies only to samples taken after fasting for at least 8 hours.   BUN  06/26/2021 6  6 - 20 mg/dL Final   Creatinine, Ser 06/26/2021 0.74  0.44 - 1.00 mg/dL Final   Calcium 06/26/2021 9.6  8.9 - 10.3 mg/dL Final   Total Protein 06/26/2021 7.1  6.5 - 8.1 g/dL Final   Albumin 06/26/2021 4.1  3.5 - 5.0 g/dL Final   AST 06/26/2021 21  15 - 41 U/L Final   ALT 06/26/2021 20  0 - 44 U/L Final   Alkaline Phosphatase 06/26/2021 60  38 - 126 U/L Final   Total Bilirubin 06/26/2021 0.6  0.3 - 1.2 mg/dL Final   GFR, Estimated 06/26/2021 >60  >60 mL/min Final   Comment: (NOTE) Calculated using the CKD-EPI Creatinine Equation (2021)    Anion gap 06/26/2021 7  5 - 15 Final   Performed at [facility] [address], [place], [place] [address]   Salicylate Lvl 02/63/7858 <7.0 (L)  7.0 - 30.0 mg/dL Final   Performed at [facility] [address], [place], [place] [address]   Acetaminophen (Tylenol), Serum 06/26/2021 <10 (L)  10 - 30 ug/mL Final  Comment: (NOTE) Therapeutic concentrations vary significantly. A range of 10-30 ug/mL  may be an effective concentration for many patients. However, some  are best treated at concentrations outside of this range. Acetaminophen concentrations >150 ug/mL at 4 hours after ingestion  and >50 ug/mL at 12 hours after ingestion are often associated with  toxic reactions.  Performed at [facility], [place] [address], [place], [place] [address]    Alcohol, Ethyl (B) 06/26/2021 <10  <10 mg/dL Final   Comment: (NOTE) Lowest detectable limit for serum alcohol is 10 mg/dL.  For medical purposes only. Performed at [facility], [place] [address], [place], [place] [address]    Opiates 06/26/2021 NONE DETECTED  NONE DETECTED Final   Cocaine 06/26/2021 NONE DETECTED  NONE DETECTED Final   Benzodiazepines 06/26/2021 NONE DETECTED  NONE DETECTED Final   Amphetamines 06/26/2021 NONE DETECTED  NONE DETECTED Final   Tetrahydrocannabinol 06/26/2021 NONE DETECTED  NONE DETECTED Final   Barbiturates 06/26/2021 NONE  DETECTED  NONE DETECTED Final   Comment: (NOTE) DRUG SCREEN FOR MEDICAL PURPOSES ONLY.  IF CONFIRMATION IS NEEDED FOR ANY PURPOSE, NOTIFY LAB WITHIN 5 DAYS.  LOWEST DETECTABLE LIMITS FOR URINE DRUG SCREEN Drug Class                     Cutoff (ng/mL) Amphetamine and metabolites    1000 Barbiturate and metabolites    200 Benzodiazepine                 710 Tricyclics and metabolites     300 Opiates and metabolites        300 Cocaine and metabolites        300 THC                            50 Performed at [facility], [place] [address], [place], [place] [address]    WBC 06/26/2021 9.4  4.0 - 10.5 K/uL Final   RBC 06/26/2021 4.89  3.87 - 5.11 MIL/uL Final   Hemoglobin 06/26/2021 12.9  12.0 - 15.0 g/dL Final   HCT 06/26/2021 40.1  36.0 - 46.0 % Final   MCV 06/26/2021 82.0  80.0 - 100.0 fL Final   MCH 06/26/2021 26.4  26.0 - 34.0 pg Final   MCHC 06/26/2021 32.2  30.0 - 36.0 g/dL Final   RDW 06/26/2021 15.5  11.5 - 15.5 % Final   Platelets 06/26/2021 298  150 - 400 K/uL Final   nRBC 06/26/2021 0.0  0.0 - 0.2 % Final   Neutrophils Relative % 06/26/2021 62  % Final   Neutro Abs 06/26/2021 5.8  1.7 - 7.7 K/uL Final   Lymphocytes Relative 06/26/2021 32  % Final   Lymphs Abs 06/26/2021 3.0  0.7 - 4.0 K/uL Final   Monocytes Relative 06/26/2021 5  % Final   Monocytes Absolute 06/26/2021 0.4  0.1 - 1.0 K/uL Final   Eosinophils Relative 06/26/2021 1  % Final   Eosinophils Absolute 06/26/2021 0.1  0.0 - 0.5 K/uL Final   Basophils Relative 06/26/2021 0  % Final   Basophils Absolute 06/26/2021 0.0  0.0 - 0.1 K/uL Final   Immature Granulocytes 06/26/2021 0  % Final   Abs Immature Granulocytes 06/26/2021 0.03  0.00 - 0.07 K/uL Final   Performed at [facility], [place] [address], [place], [place] [address]   I-stat hCG, quantitative 06/26/2021 <5.0  <5 mIU/mL Final  Comment 3 06/26/2021          Final   Comment:   GEST. AGE      CONC.  (mIU/mL)   <=1 WEEK        5 - 50     2 WEEKS        50 - 500     3 WEEKS       100 - 10,000     4 WEEKS     1,000 - 30,000        FEMALE AND NON-PREGNANT FEMALE:     LESS THAN 5 mIU/mL    Magnesium 06/26/2021 1.8  1.7 - 2.4 mg/dL Final   Performed at [facility], [place] [address], [place], [place] [address]   Acetaminophen (Tylenol), Serum 06/26/2021 <10 (L)  10 - 30 ug/mL Final   Comment: (NOTE) Therapeutic concentrations vary significantly. A range of 10-30 ug/mL  may be an effective concentration for many patients. However, some  are best treated at concentrations outside of this range. Acetaminophen concentrations >150 ug/mL at 4 hours after ingestion  and >50 ug/mL at 12 hours after ingestion are often associated with  toxic reactions.  Performed at [facility], [place] [address], [place], [place] [address]    WBC 06/26/2021 6.3  4.0 - 10.5 K/uL Final   RBC 06/26/2021 3.92  3.87 - 5.11 MIL/uL Final   Hemoglobin 06/26/2021 10.3 (L)  12.0 - 15.0 g/dL Final   HCT 06/26/2021 32.4 (L)  36.0 - 46.0 % Final   MCV 06/26/2021 82.7  80.0 - 100.0 fL Final   MCH 06/26/2021 26.3  26.0 - 34.0 pg Final   MCHC 06/26/2021 31.8  30.0 - 36.0 g/dL Final   RDW 06/26/2021 15.5  11.5 - 15.5 % Final   Platelets 06/26/2021 201  150 - 400 K/uL Final   nRBC 06/26/2021 0.0  0.0 - 0.2 % Final   Neutrophils Relative % 06/26/2021 56  % Final   Neutro Abs 06/26/2021 3.5  1.7 - 7.7 K/uL Final   Lymphocytes Relative 06/26/2021 38  % Final   Lymphs Abs 06/26/2021 2.4  0.7 - 4.0 K/uL Final   Monocytes Relative 06/26/2021 5  % Final   Monocytes Absolute 06/26/2021 0.3  0.1 - 1.0 K/uL Final   Eosinophils Relative 06/26/2021 1  % Final   Eosinophils Absolute 06/26/2021 0.1  0.0 - 0.5 K/uL Final   Basophils Relative 06/26/2021 0  % Final   Basophils Absolute 06/26/2021 0.0  0.0 - 0.1 K/uL Final   Immature Granulocytes 06/26/2021 0  % Final   Abs Immature Granulocytes 06/26/2021 0.01  0.00 - 0.07 K/uL Final   Performed at [facility],  [place] [address] [place], [place] [address]   Sodium 06/26/2021 138  135 - 145 mmol/L Final   Potassium 06/26/2021 3.7  3.5 - 5.1 mmol/L Final   Chloride 06/26/2021 114 (H)  98 - 111 mmol/L Final   CO2 06/26/2021 22  22 - 32 mmol/L Final   Glucose, Bld 06/26/2021 99  70 - 99 mg/dL Final   Glucose reference range applies only to samples taken after fasting for at least 8 hours.   BUN 06/26/2021 5 (L)  6 - 20 mg/dL Final   Creatinine, Ser 06/26/2021 0.62  0.44 - 1.00 mg/dL Final   Calcium 06/26/2021 8.0 (L)  8.9 - 10.3 mg/dL Final   Total Protein 06/26/2021 5.6 (L)  6.5 - 8.1 g/dL Final  Albumin 06/26/2021 3.1 (L)  3.5 - 5.0 g/dL Final   AST 06/26/2021 15  15 - 41 U/L Final   ALT 06/26/2021 15  0 - 44 U/L Final   Alkaline Phosphatase 06/26/2021 46  38 - 126 U/L Final   Total Bilirubin 06/26/2021 0.5  0.3 - 1.2 mg/dL Final   GFR, Estimated 06/26/2021 >60  >60 mL/min Final   Comment: (NOTE) Calculated using the CKD-EPI Creatinine Equation (2021)    Anion gap 06/26/2021 2 (L)  5 - 15 Final   Comment: Electrolytes repeated to confirm. Performed at [facility], [place] [address], [place], [place] [address]   Admission on 06/23/2021, Discharged on 06/24/2021  Component Date Value Ref Range Status   SARS Coronavirus 2 by RT PCR 06/24/2021 NEGATIVE  NEGATIVE Final   Comment: (NOTE) SARS-CoV-2 target nucleic acids are NOT DETECTED.  The SARS-CoV-2 RNA is generally detectable in upper respiratory specimens during the acute phase of infection. The lowest concentration of SARS-CoV-2 viral copies this assay can detect is 138 copies/mL. A negative result does not preclude SARS-Cov-2 infection and should not be used as the sole basis for treatment or other patient management decisions. A negative result may occur with  improper specimen collection/handling, submission of specimen other than nasopharyngeal swab, presence of viral mutation(s) within the areas targeted by this assay, and inadequate  number of viral copies(<138 copies/mL). A negative result must be combined with clinical observations, patient history, and epidemiological information. The expected result is Negative.  Fact Sheet for Patients:  EntrepreneurPulse.com.au  Fact Sheet for Healthcare Providers:  IncredibleEmployment.be  This test is no                          t yet approved or cleared by the [place] FDA and  has been authorized for detection and/or diagnosis of SARS-CoV-2 by FDA under an Emergency Use Authorization (EUA). This EUA will remain  in effect (meaning this test can be used) for the duration of the COVID-19 declaration under Section 564(b)(1) of the Act, 21 U.S.C.section 360bbb-3(b)(1), unless the authorization is terminated  or revoked sooner.       Influenza A by PCR 06/24/2021 NEGATIVE  NEGATIVE Final   Influenza B by PCR 06/24/2021 NEGATIVE  NEGATIVE Final   Comment: (NOTE) The Xpert Xpress SARS-CoV-2/FLU/RSV plus assay is intended as an aid in the diagnosis of influenza from Nasopharyngeal swab specimens and should not be used as a sole basis for treatment. Nasal washings and aspirates are unacceptable for Xpert Xpress SARS-CoV-2/FLU/RSV testing.  Fact Sheet for Patients: EntrepreneurPulse.com.au  Fact Sheet for Healthcare Providers: IncredibleEmployment.be  This test is not yet approved or cleared by the [place] FDA and has been authorized for detection and/or diagnosis of SARS-CoV-2 by FDA under an Emergency Use Authorization (EUA). This EUA will remain in effect (meaning this test can be used) for the duration of the COVID-19 declaration under Section 564(b)(1) of the Act, 21 U.S.C. section 360bbb-3(b)(1), unless the authorization is terminated or revoked.  Performed at [facility], [place] [address] [place], [place] [address]    POC Amphetamine UR 06/24/2021 None Detected  NONE  DETECTED (Cut Off Level 1000 ng/mL) Final   POC Secobarbital (BAR) 06/24/2021 None Detected  NONE DETECTED (Cut Off Level 300 ng/mL) Final   POC Buprenorphine (BUP) 06/24/2021 None Detected  NONE DETECTED (Cut Off Level 10 ng/mL) Final   POC Oxazepam (BZO) 06/24/2021 Positive (A)  NONE DETECTED (Cut Off Level 300 ng/mL) Final   POC Cocaine UR 06/24/2021 None Detected  NONE DETECTED (Cut Off Level 300 ng/mL) Final   POC Methamphetamine UR 06/24/2021 None Detected  NONE DETECTED (Cut Off Level 1000 ng/mL) Final   POC Morphine 06/24/2021 None Detected  NONE DETECTED (Cut Off Level 300 ng/mL) Final   POC Methadone UR 06/24/2021 None Detected  NONE DETECTED (Cut Off Level 300 ng/mL) Final   POC Oxycodone UR 06/24/2021 None Detected  NONE DETECTED (Cut Off Level 100 ng/mL) Final   POC Marijuana UR 06/24/2021 None Detected  NONE DETECTED (Cut Off Level 50 ng/mL) Final   SARSCOV2ONAVIRUS 2 AG 06/24/2021 NEGATIVE  NEGATIVE Final   Comment: (NOTE) SARS-CoV-2 antigen NOT DETECTED.   Negative results are presumptive.  Negative results do not preclude SARS-CoV-2 infection and should not be used as the sole basis for treatment or other patient management decisions, including infection  control decisions, particularly in the presence of clinical signs and  symptoms consistent with COVID-19, or in those who have been in contact with the virus.  Negative results must be combined with clinical observations, patient history, and epidemiological information. The expected result is Negative.  Fact Sheet for Patients: HandmadeRecipes.com.cy  Fact Sheet for Healthcare Providers: FuneralLife.at  This test is not yet approved or cleared by the [place] FDA and  has been authorized for detection and/or diagnosis of SARS-CoV-2 by FDA under an Emergency Use Authorization (EUA).  This EUA will remain in effect (meaning this test can be used) for the duration of   the COV                          ID-19 declaration under Section 564(b)(1) of the Act, 21 U.S.C. section 360bbb-3(b)(1), unless the authorization is terminated or revoked sooner.     Preg Test, Ur 06/24/2021 NEGATIVE  NEGATIVE Final   Comment:        THE SENSITIVITY OF THIS METHODOLOGY IS >24 mIU/mL   Admission on 06/23/2021, Discharged on 06/23/2021  Component Date Value Ref Range Status   Preg Test, Ur 06/23/2021 NEGATIVE  NEGATIVE Final   Comment:        THE SENSITIVITY OF THIS METHODOLOGY IS >20 mIU/mL. Performed at [facility], [place] [address], [place], [place] [address]   Admission on 06/04/2021, Discharged on 06/04/2021  Component Date Value Ref Range Status   Sodium 06/04/2021 138  135 - 145 mmol/L Final   Potassium 06/04/2021 3.4 (L)  3.5 - 5.1 mmol/L Final   Chloride 06/04/2021 108  98 - 111 mmol/L Final   CO2 06/04/2021 23  22 - 32 mmol/L Final   Glucose, Bld 06/04/2021 94  70 - 99 mg/dL Final   Glucose reference range applies only to samples taken after fasting for at least 8 hours.   BUN 06/04/2021 7  6 - 20 mg/dL Final   Creatinine, Ser 06/04/2021 0.73  0.44 - 1.00 mg/dL Final   Calcium 06/04/2021 9.1  8.9 - 10.3 mg/dL Final   Total Protein 06/04/2021 7.2  6.5 - 8.1 g/dL Final   Albumin 06/04/2021 4.0  3.5 - 5.0 g/dL Final   AST 06/04/2021 17  15 - 41 U/L Final   ALT 06/04/2021 14  0 - 44 U/L Final   Alkaline Phosphatase 06/04/2021 71  38 - 126 U/L Final   Total Bilirubin 06/04/2021 0.4  0.3 - 1.2 mg/dL Final   GFR, Estimated 06/04/2021 >  60  >60 mL/min Final   Comment: (NOTE) Calculated using the CKD-EPI Creatinine Equation (2021)    Anion gap 06/04/2021 7  5 - 15 Final   Performed at [facility], [place] [address] [place], [place] [address]   Salicylate Lvl 19/41/7408 <7.0 (L)  7.0 - 30.0 mg/dL Final   Performed at [facility] [address] [place], [place] [address]   Acetaminophen (Tylenol), Serum 06/04/2021 <10 (L)  10  - 30 ug/mL Final   Comment: (NOTE) Therapeutic concentrations vary significantly. A range of 10-30 ug/mL  may be an effective concentration for many patients. However, some  are best treated at concentrations outside of this range. Acetaminophen concentrations >150 ug/mL at 4 hours after ingestion  and >50 ug/mL at 12 hours after ingestion are often associated with  toxic reactions.  Performed at [facility], [place] [address], [place], [place] [address]    Alcohol, Ethyl (B) 06/04/2021 <10  <10 mg/dL Final   Comment: (NOTE) Lowest detectable limit for serum alcohol is 10 mg/dL.  For medical purposes only. Performed at [facility], [place] [address], [place], [place] [address]    Opiates 06/04/2021 NONE DETECTED  NONE DETECTED Final   Cocaine 06/04/2021 NONE DETECTED  NONE DETECTED Final   Benzodiazepines 06/04/2021 NONE DETECTED  NONE DETECTED Final   Amphetamines 06/04/2021 NONE DETECTED  NONE DETECTED Final   Tetrahydrocannabinol 06/04/2021 NONE DETECTED  NONE DETECTED Final   Barbiturates 06/04/2021 NONE DETECTED  NONE DETECTED Final   Comment: (NOTE) DRUG SCREEN FOR MEDICAL PURPOSES ONLY.  IF CONFIRMATION IS NEEDED FOR ANY PURPOSE, NOTIFY LAB WITHIN 5 DAYS.  LOWEST DETECTABLE LIMITS FOR URINE DRUG SCREEN Drug Class                     Cutoff (ng/mL) Amphetamine and metabolites    1000 Barbiturate and metabolites    200 Benzodiazepine                 637 Tricyclics and metabolites     300 Opiates and metabolites        300 Cocaine and metabolites        300 THC                            50 Performed at [facility], [place] [address] [place], [place] [address]    WBC 06/04/2021 8.2  4.0 - 10.5 K/uL Final   RBC 06/04/2021 4.73  3.87 - 5.11 MIL/uL Final   Hemoglobin 06/04/2021 12.2  12.0 - 15.0 g/dL Final   HCT 06/04/2021 38.2  36.0 - 46.0 % Final   MCV 06/04/2021 80.8  80.0 - 100.0 fL Final   MCH 06/04/2021 25.8 (L)  26.0 - 34.0 pg Final   MCHC  06/04/2021 31.9  30.0 - 36.0 g/dL Final   RDW 06/04/2021 13.9  11.5 - 15.5 % Final   Platelets 06/04/2021 344  150 - 400 K/uL Final   nRBC 06/04/2021 0.0  0.0 - 0.2 % Final   Neutrophils Relative % 06/04/2021 53  % Final   Neutro Abs 06/04/2021 4.3  1.7 - 7.7 K/uL Final   Lymphocytes Relative 06/04/2021 38  % Final   Lymphs Abs 06/04/2021 3.1  0.7 - 4.0 K/uL Final   Monocytes Relative 06/04/2021 7  % Final   Monocytes Absolute 06/04/2021 0.6  0.1 - 1.0 K/uL Final  Eosinophils Relative 06/04/2021 1  % Final   Eosinophils Absolute 06/04/2021 0.1  0.0 - 0.5 K/uL Final   Basophils Relative 06/04/2021 1  % Final   Basophils Absolute 06/04/2021 0.1  0.0 - 0.1 K/uL Final   Immature Granulocytes 06/04/2021 0  % Final   Abs Immature Granulocytes 06/04/2021 0.03  0.00 - 0.07 K/uL Final   Performed at [facility], [place] [address], [place],  [address]   I-stat hCG, quantitative 06/04/2021 <5.0  <5 mIU/mL Final   Comment 3 06/04/2021          Final   Comment:   GEST. AGE      CONC.  (mIU/mL)   <=1 WEEK        5 - 50     2 WEEKS       50 - 500     3 WEEKS       100 - 10,000     4 WEEKS     1,000 - 30,000        FEMALE AND NON-PREGNANT FEMALE:     LESS THAN 5 mIU/mL    SARS Coronavirus 2 by RT PCR 06/04/2021 NEGATIVE  NEGATIVE Final   Comment: (NOTE) SARS-CoV-2 target nucleic acids are NOT DETECTED.  The SARS-CoV-2 RNA is generally detectable in upper respiratory specimens during the acute phase of infection. The lowest concentration of SARS-CoV-2 viral copies this assay can detect is 138 copies/mL. A negative result does not preclude SARS-Cov-2 infection and should not be used as the sole basis for treatment or other patient management decisions. A negative result may occur with  improper specimen collection/handling, submission of specimen other than nasopharyngeal swab, presence of viral mutation(s) within the areas targeted by this assay, and inadequate number of  viral copies(<138 copies/mL). A negative result must be combined with clinical observations, patient history, and epidemiological information. The expected result is Negative.  Fact Sheet for Patients:  EntrepreneurPulse.com.au  Fact Sheet for Healthcare Providers:  IncredibleEmployment.be  This test is no                          t yet approved or cleared by the [place] FDA and  has been authorized for detection and/or diagnosis of SARS-CoV-2 by FDA under an Emergency Use Authorization (EUA). This EUA will remain  in effect (meaning this test can be used) for the duration of the COVID-19 declaration under Section 564(b)(1) of the Act, 21 U.S.C.section 360bbb-3(b)(1), unless the authorization is terminated  or revoked sooner.       Influenza A by PCR 06/04/2021 NEGATIVE  NEGATIVE Final   Influenza B by PCR 06/04/2021 NEGATIVE  NEGATIVE Final   Comment: (NOTE) The Xpert Xpress SARS-CoV-2/FLU/RSV plus assay is intended as an aid in the diagnosis of influenza from Nasopharyngeal swab specimens and should not be used as a sole basis for treatment. Nasal washings and aspirates are unacceptable for Xpert Xpress SARS-CoV-2/FLU/RSV testing.  Fact Sheet for Patients: EntrepreneurPulse.com.au  Fact Sheet for Healthcare Providers: IncredibleEmployment.be  This test is not yet approved or cleared by the [place] FDA and has been authorized for detection and/or diagnosis of SARS-CoV-2 by FDA under an Emergency Use Authorization (EUA). This EUA will remain in effect (meaning this test can be used) for the duration of the COVID-19 declaration under Section 564(b)(1) of the Act, 21 U.S.C. section 360bbb-3(b)(1), unless the authorization is terminated or revoked.  Performed at [facility], [address] [place], [place] [address]   Admission on 05/29/2021, Discharged on 05/30/2021  Component Date  Value Ref Range Status   SARS Coronavirus 2 by RT PCR 05/29/2021 NEGATIVE  NEGATIVE Final   Comment: (NOTE) SARS-CoV-2 target nucleic acids are NOT DETECTED.  The SARS-CoV-2 RNA is generally detectable in upper respiratory specimens during the acute phase of infection. The lowest concentration of SARS-CoV-2 viral copies this assay can detect is 138 copies/mL. A negative result does not preclude SARS-Cov-2 infection and should not be used as the sole basis for treatment or other patient management decisions. A negative result may occur with  improper specimen collection/handling, submission of specimen other than nasopharyngeal swab, presence of viral mutation(s) within the areas targeted by this assay, and inadequate number of viral copies(<138 copies/mL). A negative result must be combined with clinical observations, patient history, and epidemiological information. The expected result is Negative.  Fact Sheet for Patients:  EntrepreneurPulse.com.au  Fact Sheet for Healthcare Providers:  IncredibleEmployment.be  This test is no                          t yet approved or cleared by the [place] FDA and  has been authorized for detection and/or diagnosis of SARS-CoV-2 by FDA under an Emergency Use Authorization (EUA). This EUA will remain  in effect (meaning this test can be used) for the duration of the COVID-19 declaration under Section 564(b)(1) of the Act, 21 U.S.C.section 360bbb-3(b)(1), unless the authorization is terminated  or revoked sooner.       Influenza A by PCR 05/29/2021 NEGATIVE  NEGATIVE Final   Influenza B by PCR 05/29/2021 NEGATIVE  NEGATIVE Final   Comment: (NOTE) The Xpert Xpress SARS-CoV-2/FLU/RSV plus assay is intended as an aid in the diagnosis of influenza from Nasopharyngeal swab specimens and should not be used as a sole basis for treatment. Nasal washings and aspirates are unacceptable for Xpert Xpress  SARS-CoV-2/FLU/RSV testing.  Fact Sheet for Patients: EntrepreneurPulse.com.au  Fact Sheet for Healthcare Providers: IncredibleEmployment.be  This test is not yet approved or cleared by the [place] FDA and has been authorized for detection and/or diagnosis of SARS-CoV-2 by FDA under an Emergency Use Authorization (EUA). This EUA will remain in effect (meaning this test can be used) for the duration of the COVID-19 declaration under Section 564(b)(1) of the Act, 21 U.S.C. section 360bbb-3(b)(1), unless the authorization is terminated or revoked.  Performed at [facility], [place] [address], [place], [place] [address]    Color, Urine 05/29/2021 STRAW (A)  YELLOW Final   APPearance 05/29/2021 CLEAR  CLEAR Final   Specific Gravity, Urine 05/29/2021 1.010  1.005 - 1.030 Final   pH 05/29/2021 6.0  5.0 - 8.0 Final   Glucose, UA 05/29/2021 NEGATIVE  NEGATIVE mg/dL Final   Hgb urine dipstick 05/29/2021 NEGATIVE  NEGATIVE Final   Bilirubin Urine 05/29/2021 NEGATIVE  NEGATIVE Final   Ketones, ur 05/29/2021 NEGATIVE  NEGATIVE mg/dL Final   Protein, ur 05/29/2021 NEGATIVE  NEGATIVE mg/dL Final   Nitrite 05/29/2021 NEGATIVE  NEGATIVE Final   Leukocytes,Ua 05/29/2021 NEGATIVE  NEGATIVE Final   RBC / HPF 05/29/2021 0-5  0 - 5 RBC/hpf Final   WBC, UA 05/29/2021 0-5  0 - 5 WBC/hpf Final   Bacteria, UA 05/29/2021 NONE SEEN  NONE SEEN Final   Squamous Epithelial / LPF 05/29/2021 0-5  0 - 5 Final   Mucus 05/29/2021 PRESENT   Final  Performed at [facility], [place] [address] [place], [place] [address]   WBC 05/29/2021 8.7  4.0 - 10.5 K/uL Final   RBC 05/29/2021 4.56  3.87 - 5.11 MIL/uL Final   Hemoglobin 05/29/2021 12.1  12.0 - 15.0 g/dL Final   HCT 05/29/2021 37.8  36.0 - 46.0 % Final   MCV 05/29/2021 82.9  80.0 - 100.0 fL Final   MCH 05/29/2021 26.5  26.0 - 34.0 pg Final   MCHC 05/29/2021 32.0  30.0 - 36.0 g/dL Final   RDW 05/29/2021 14.6   11.5 - 15.5 % Final   Platelets 05/29/2021 205  150 - 400 K/uL Final   nRBC 05/29/2021 0.0  0.0 - 0.2 % Final   Performed at [facility] [address] [place], [place] [address]   Sodium 05/29/2021 138  135 - 145 mmol/L Final   Potassium 05/29/2021 3.7  3.5 - 5.1 mmol/L Final   Chloride 05/29/2021 108  98 - 111 mmol/L Final   CO2 05/29/2021 22  22 - 32 mmol/L Final   Glucose, Bld 05/29/2021 121 (H)  70 - 99 mg/dL Final   Glucose reference range applies only to samples taken after fasting for at least 8 hours.   BUN 05/29/2021 8  6 - 20 mg/dL Final   Creatinine, Ser 05/29/2021 0.66  0.44 - 1.00 mg/dL Final   Calcium 05/29/2021 9.3  8.9 - 10.3 mg/dL Final   GFR, Estimated 05/29/2021 >60  >60 mL/min Final   Comment: (NOTE) Calculated using the CKD-EPI Creatinine Equation (2021)    Anion gap 05/29/2021 8  5 - 15 Final   Performed at [facility], [place] [address] [place], [place] [address]   I-stat hCG, quantitative 05/30/2021 <5.0  <5 mIU/mL Final   Comment 3 05/30/2021          Final   Comment:   GEST. AGE      CONC.  (mIU/mL)   <=1 WEEK        5 - 50     2 WEEKS       50 - 500     3 WEEKS       100 - 10,000     4 WEEKS     1,000 - 30,000        FEMALE AND NON-PREGNANT FEMALE:     LESS THAN 5 mIU/mL   Admission on 05/01/2021, Discharged on 05/01/2021  Component Date Value Ref Range Status   SARS Coronavirus 2 by RT PCR 05/01/2021 NEGATIVE  NEGATIVE Final   Comment: (NOTE) SARS-CoV-2 target nucleic acids are NOT DETECTED.  The SARS-CoV-2 RNA is generally detectable in upper respiratory specimens during the acute phase of infection. The lowest concentration of SARS-CoV-2 viral copies this assay can detect is 138 copies/mL. A negative result does not preclude SARS-Cov-2 infection and should not be used as the sole basis for treatment or other patient management decisions. A negative result may occur with  improper specimen collection/handling, submission of specimen  other than nasopharyngeal swab, presence of viral mutation(s) within the areas targeted by this assay, and inadequate number of viral copies(<138 copies/mL). A negative result must be combined with clinical observations, patient history, and epidemiological information. The expected result is Negative.  Fact Sheet for Patients:  EntrepreneurPulse.com.au  Fact Sheet for Healthcare Providers:  IncredibleEmployment.be  This test is no  t yet approved or cleared by the [place] and  has been authorized for detection and/or diagnosis of SARS-CoV-2 by FDA under an Emergency Use Authorization (EUA). This EUA will remain  in effect (meaning this test can be used) for the duration of the COVID-19 declaration under Section 564(b)(1) of the Act, 21 U.S.C.section 360bbb-3(b)(1), unless the authorization is terminated  or revoked sooner.       Influenza A by PCR 05/01/2021 NEGATIVE  NEGATIVE Final   Influenza B by PCR 05/01/2021 NEGATIVE  NEGATIVE Final   Comment: (NOTE) The Xpert Xpress SARS-CoV-2/FLU/RSV plus assay is intended as an aid in the diagnosis of influenza from Nasopharyngeal swab specimens and should not be used as a sole basis for treatment. Nasal washings and aspirates are unacceptable for Xpert Xpress SARS-CoV-2/FLU/RSV testing.  Fact Sheet for Patients: EntrepreneurPulse.com.au  Fact Sheet for Healthcare Providers: IncredibleEmployment.be  This test is not yet approved or cleared by the [place] FDA and has been authorized for detection and/or diagnosis of SARS-CoV-2 by FDA under an Emergency Use Authorization (EUA). This EUA will remain in effect (meaning this test can be used) for the duration of the COVID-19 declaration under Section 564(b)(1) of the Act, 21 U.S.C. section 360bbb-3(b)(1), unless the authorization is terminated or revoked.  Performed at  [facility], [place] [address] [place], [place] [address]    WBC 05/01/2021 12.7 (H)  4.0 - 10.5 K/uL Final   RBC 05/01/2021 5.28 (H)  3.87 - 5.11 MIL/uL Final   Hemoglobin 05/01/2021 13.9  12.0 - 15.0 g/dL Final   HCT 05/01/2021 41.9  36.0 - 46.0 % Final   MCV 05/01/2021 79.4 (L)  80.0 - 100.0 fL Final   MCH 05/01/2021 26.3  26.0 - 34.0 pg Final   MCHC 05/01/2021 33.2  30.0 - 36.0 g/dL Final   RDW 05/01/2021 13.4  11.5 - 15.5 % Final   Platelets 05/01/2021 351  150 - 400 K/uL Final   nRBC 05/01/2021 0.0  0.0 - 0.2 % Final   Neutrophils Relative % 05/01/2021 66  % Final   Neutro Abs 05/01/2021 8.4 (H)  1.7 - 7.7 K/uL Final   Lymphocytes Relative 05/01/2021 27  % Final   Lymphs Abs 05/01/2021 3.4  0.7 - 4.0 K/uL Final   Monocytes Relative 05/01/2021 5  % Final   Monocytes Absolute 05/01/2021 0.7  0.1 - 1.0 K/uL Final   Eosinophils Relative 05/01/2021 1  % Final   Eosinophils Absolute 05/01/2021 0.1  0.0 - 0.5 K/uL Final   Basophils Relative 05/01/2021 0  % Final   Basophils Absolute 05/01/2021 0.1  0.0 - 0.1 K/uL Final   Immature Granulocytes 05/01/2021 1  % Final   Abs Immature Granulocytes 05/01/2021 0.07  0.00 - 0.07 K/uL Final   Performed at [facility], [place] [address], [place], [place] [address]   Sodium 05/01/2021 137  135 - 145 mmol/L Final   Potassium 05/01/2021 3.7  3.5 - 5.1 mmol/L Final   Chloride 05/01/2021 107  98 - 111 mmol/L Final   CO2 05/01/2021 21 (L)  22 - 32 mmol/L Final   Glucose, Bld 05/01/2021 96  70 - 99 mg/dL Final   Glucose reference range applies only to samples taken after fasting for at least 8 hours.   BUN 05/01/2021 9  6 - 20 mg/dL Final   Creatinine, Ser 05/01/2021 0.73  0.44 - 1.00 mg/dL Final   Calcium 05/01/2021 9.5  8.9 - 10.3 mg/dL  Final   Total Protein 05/01/2021 7.8  6.5 - 8.1 g/dL Final   Albumin 05/01/2021 4.4  3.5 - 5.0 g/dL Final   AST 05/01/2021 18  15 - 41 U/L Final   ALT 05/01/2021 24  0 - 44 U/L Final   Alkaline  Phosphatase 05/01/2021 70  38 - 126 U/L Final   Total Bilirubin 05/01/2021 0.6  0.3 - 1.2 mg/dL Final   GFR, Estimated 05/01/2021 >60  >60 mL/min Final   Comment: (NOTE) Calculated using the CKD-EPI Creatinine Equation (2021)    Anion gap 05/01/2021 9  5 - 15 Final   Performed at [facility], [place] [address] [place], [place] [address]   Alcohol, Ethyl (B) 05/01/2021 <10  <10 mg/dL Final   Comment: (NOTE) Lowest detectable limit for serum alcohol is 10 mg/dL.  For medical purposes only. Performed at [facility], [place] [address] [place], [place] [address]    Cholesterol 05/01/2021 245 (H)  0 - 200 mg/dL Final   Triglycerides 05/01/2021 169 (H)  <150 mg/dL Final   HDL 05/01/2021 46  >40 mg/dL Final   Total CHOL/HDL Ratio 05/01/2021 5.3  RATIO Final   VLDL 05/01/2021 34  0 - 40 mg/dL Final   LDL Cholesterol 05/01/2021 165 (H)  0 - 99 mg/dL Final   Comment:        Total Cholesterol/HDL:CHD Risk Coronary Heart Disease Risk Table                     Men   Women  1/2 Average Risk   3.4   3.3  Average Risk       5.0   4.4  2 X Average Risk   9.6   7.1  3 X Average Risk  23.4   11.0        Use the calculated Patient Ratio above and the CHD Risk Table to determine the patient's CHD Risk.        ATP III CLASSIFICATION (LDL):  <100     mg/dL   Optimal  100-129  mg/dL   Near or Above                    Optimal  130-159  mg/dL   Borderline  160-189  mg/dL   High  >190     mg/dL   Very High Performed at [place] [address], [place], [place] [address]    TSH 05/01/2021 2.572  0.350 - 4.500 uIU/mL Final   Comment: Performed by a 3rd Generation assay with a functional sensitivity of <=0.01 uIU/mL. Performed at [facility], [place] [address], [place], [place] [address]    SARS Coronavirus 2 Ag 05/01/2021 Negative (NE)  Negative Final   SARSCOV2ONAVIRUS 2 AG 05/01/2021 NEGATIVE  NEGATIVE Final   Comment: (NOTE) SARS-CoV-2 antigen NOT DETECTED.    Negative results are presumptive.  Negative results do not preclude SARS-CoV-2 infection and should not be used as the sole basis for treatment or other patient management decisions, including infection  control decisions, particularly in the presence of clinical signs and  symptoms consistent with COVID-19, or in those who have been in contact with the virus.  Negative results must be combined with clinical observations, patient history, and epidemiological information. The expected result is Negative.  Fact Sheet for Patients: HandmadeRecipes.com.cy  Fact Sheet for Healthcare Providers: FuneralLife.at  This test is not yet approved or cleared by the  [place] Architectural technologist and  has been authorized for detection and/or diagnosis of SARS-CoV-2 by FDA under an Print production planner (EUA).  This EUA will remain in effect (meaning this test can be used) for the duration of  the COV                          ID-19 declaration under Section 564(b)(1) of the Act, 21 U.S.C. section 360bbb-3(b)(1), unless the authorization is terminated or revoked sooner.    There may be more visits with results that are not included.    Allergies: Bee venom, Coconut flavor, Geodon [ziprasidone hcl], Haloperidol and related, Lithobid [lithium], Other, Roxicodone [oxycodone], Seroquel [quetiapine], Shellfish allergy, Phenergan [promethazine hcl], Prilosec [omeprazole], Sulfa antibiotics, Tegretol [carbamazepine], Prozac [fluoxetine], Tape, and Tylenol [acetaminophen]  PTA Medications: (Not in a hospital admission)   Medical Decision Making  Inpatient observation  Lab Orders         Resp Panel by RT-PCR (Flu A&B, Covid) Anterior Nasal Swab         CBC with Differential/Platelet         Comprehensive metabolic panel         Hemoglobin A1c         TSH         Lipid panel         Pregnancy, urine         POCT Urine Drug Screen - (I-Screen)      Meds  ordered this encounter  Medications   acetaminophen (TYLENOL) tablet 650 mg   alum & mag hydroxide-simeth (MAALOX/MYLANTA) 200-200-20 MG/5ML suspension 30 mL   magnesium hydroxide (MILK OF MAGNESIA) suspension 30 mL       Recommendations  Based on my evaluation the patient does not appear to have an emergency medical condition.  [name], NP 07/19/21  11:37 PM

## 2021-07-19 NOTE — BH Assessment (Signed)
[facility] Assessment Progress Note   Per Shuvon Rankin, NP, this pt does not require psychiatric hospitalization at this time.  Pt is psychiatrically cleared.  Discharge instructions advise pt to continue treatment with the [facility].  At [facility] request this writer called them to ask about augmenting her outpatient services.  Team Lead Butch Penny will reportedly be out of the office for several days, but front office staff provided name and e-mail address the the department supervisor, [employer].  At 13:28 I sent her an e-mail requesting return call.  Response is pending as of this writing.  EDP Godfrey Pick, MD, Delphia Grates and pt's nurse, Vonna Kotyk, have been notified.  [name], [place] Triage Specialist [phone]

## 2021-07-19 NOTE — ED Notes (Addendum)
DC instructions reviewed with pt.  PT verbalized understanding. PT DC. Dressing supplies provided for neck wound. Cab voucher provided.

## 2021-07-20 ENCOUNTER — Inpatient Hospital Stay (HOSPITAL_COMMUNITY)
Admission: EM | Admit: 2021-07-20 | Discharge: 2021-07-25 | DRG: 918 | Disposition: A | Discharge status: Other Acute Inpatient Hospital | Payer: PPO | Attending: Internal Medicine | Admitting: Internal Medicine

## 2021-07-20 ENCOUNTER — Encounter (HOSPITAL_COMMUNITY): Payer: Self-pay

## 2021-07-20 DIAGNOSIS — Z20822 Contact with and (suspected) exposure to covid-19: Secondary | ICD-10-CM | POA: Diagnosis present

## 2021-07-20 DIAGNOSIS — G43009 Migraine without aura, not intractable, without status migrainosus: Secondary | ICD-10-CM | POA: Diagnosis not present

## 2021-07-20 DIAGNOSIS — F251 Schizoaffective disorder, depressive type: Secondary | ICD-10-CM | POA: Diagnosis not present

## 2021-07-20 DIAGNOSIS — Y92009 Unspecified place in unspecified non-institutional (private) residence as the place of occurrence of the external cause: Secondary | ICD-10-CM

## 2021-07-20 DIAGNOSIS — Z7951 Long term (current) use of inhaled steroids: Secondary | ICD-10-CM

## 2021-07-20 DIAGNOSIS — F411 Generalized anxiety disorder: Secondary | ICD-10-CM | POA: Diagnosis present

## 2021-07-20 DIAGNOSIS — R45851 Suicidal ideations: Secondary | ICD-10-CM

## 2021-07-20 DIAGNOSIS — T50902A Poisoning by unspecified drugs, medicaments and biological substances, intentional self-harm, initial encounter: Secondary | ICD-10-CM | POA: Diagnosis not present

## 2021-07-20 DIAGNOSIS — F840 Autistic disorder: Secondary | ICD-10-CM | POA: Diagnosis not present

## 2021-07-20 DIAGNOSIS — F250 Schizoaffective disorder, bipolar type: Secondary | ICD-10-CM | POA: Diagnosis not present

## 2021-07-20 DIAGNOSIS — Z818 Family history of other mental and behavioral disorders: Secondary | ICD-10-CM

## 2021-07-20 DIAGNOSIS — Z885 Allergy status to narcotic agent status: Secondary | ICD-10-CM

## 2021-07-20 DIAGNOSIS — Z888 Allergy status to other drugs, medicaments and biological substances status: Secondary | ICD-10-CM | POA: Diagnosis not present

## 2021-07-20 DIAGNOSIS — Z91030 Bee allergy status: Secondary | ICD-10-CM

## 2021-07-20 DIAGNOSIS — Z765 Malingerer [conscious simulation]: Secondary | ICD-10-CM

## 2021-07-20 DIAGNOSIS — K5909 Other constipation: Secondary | ICD-10-CM | POA: Diagnosis present

## 2021-07-20 DIAGNOSIS — F333 Major depressive disorder, recurrent, severe with psychotic symptoms: Secondary | ICD-10-CM | POA: Diagnosis not present

## 2021-07-20 DIAGNOSIS — Z6835 Body mass index (BMI) 35.0-35.9, adult: Secondary | ICD-10-CM

## 2021-07-20 DIAGNOSIS — S1191XA Laceration without foreign body of unspecified part of neck, initial encounter: Secondary | ICD-10-CM | POA: Diagnosis not present

## 2021-07-20 DIAGNOSIS — R0789 Other chest pain: Secondary | ICD-10-CM | POA: Diagnosis not present

## 2021-07-20 DIAGNOSIS — G40909 Epilepsy, unspecified, not intractable, without status epilepticus: Secondary | ICD-10-CM

## 2021-07-20 DIAGNOSIS — Z79899 Other long term (current) drug therapy: Secondary | ICD-10-CM | POA: Diagnosis not present

## 2021-07-20 DIAGNOSIS — F315 Bipolar disorder, current episode depressed, severe, with psychotic features: Secondary | ICD-10-CM

## 2021-07-20 DIAGNOSIS — Z9151 Personal history of suicidal behavior: Secondary | ICD-10-CM

## 2021-07-20 DIAGNOSIS — F22 Delusional disorders: Secondary | ICD-10-CM | POA: Diagnosis not present

## 2021-07-20 DIAGNOSIS — J4520 Mild intermittent asthma, uncomplicated: Secondary | ICD-10-CM | POA: Diagnosis present

## 2021-07-20 DIAGNOSIS — G43909 Migraine, unspecified, not intractable, without status migrainosus: Secondary | ICD-10-CM | POA: Diagnosis present

## 2021-07-20 DIAGNOSIS — T426X2A Poisoning by other antiepileptic and sedative-hypnotic drugs, intentional self-harm, initial encounter: Secondary | ICD-10-CM | POA: Diagnosis present

## 2021-07-20 DIAGNOSIS — R079 Chest pain, unspecified: Secondary | ICD-10-CM | POA: Diagnosis not present

## 2021-07-20 DIAGNOSIS — R442 Other hallucinations: Secondary | ICD-10-CM | POA: Diagnosis not present

## 2021-07-20 DIAGNOSIS — E669 Obesity, unspecified: Secondary | ICD-10-CM | POA: Diagnosis not present

## 2021-07-20 DIAGNOSIS — D638 Anemia in other chronic diseases classified elsewhere: Secondary | ICD-10-CM | POA: Diagnosis not present

## 2021-07-20 DIAGNOSIS — Z148 Genetic carrier of other disease: Secondary | ICD-10-CM

## 2021-07-20 DIAGNOSIS — F259 Schizoaffective disorder, unspecified: Secondary | ICD-10-CM | POA: Diagnosis present

## 2021-07-20 DIAGNOSIS — T43592A Poisoning by other antipsychotics and neuroleptics, intentional self-harm, initial encounter: Secondary | ICD-10-CM | POA: Diagnosis not present

## 2021-07-20 DIAGNOSIS — F603 Borderline personality disorder: Secondary | ICD-10-CM | POA: Diagnosis not present

## 2021-07-20 DIAGNOSIS — F319 Bipolar disorder, unspecified: Secondary | ICD-10-CM | POA: Diagnosis present

## 2021-07-20 DIAGNOSIS — T43222A Poisoning by selective serotonin reuptake inhibitors, intentional self-harm, initial encounter: Secondary | ICD-10-CM | POA: Diagnosis not present

## 2021-07-20 DIAGNOSIS — T1491XA Suicide attempt, initial encounter: Secondary | ICD-10-CM | POA: Diagnosis present

## 2021-07-20 DIAGNOSIS — Z825 Family history of asthma and other chronic lower respiratory diseases: Secondary | ICD-10-CM | POA: Diagnosis not present

## 2021-07-20 DIAGNOSIS — F4325 Adjustment disorder with mixed disturbance of emotions and conduct: Secondary | ICD-10-CM | POA: Diagnosis not present

## 2021-07-20 DIAGNOSIS — F4310 Post-traumatic stress disorder, unspecified: Secondary | ICD-10-CM | POA: Diagnosis not present

## 2021-07-20 DIAGNOSIS — F332 Major depressive disorder, recurrent severe without psychotic features: Secondary | ICD-10-CM | POA: Diagnosis not present

## 2021-07-20 DIAGNOSIS — G4730 Sleep apnea, unspecified: Secondary | ICD-10-CM | POA: Diagnosis present

## 2021-07-20 DIAGNOSIS — Z87891 Personal history of nicotine dependence: Secondary | ICD-10-CM

## 2021-07-20 DIAGNOSIS — D649 Anemia, unspecified: Secondary | ICD-10-CM | POA: Diagnosis not present

## 2021-07-20 DIAGNOSIS — T471X2A Poisoning by other antacids and anti-gastric-secretion drugs, intentional self-harm, initial encounter: Secondary | ICD-10-CM | POA: Diagnosis not present

## 2021-07-20 DIAGNOSIS — R42 Dizziness and giddiness: Secondary | ICD-10-CM | POA: Diagnosis not present

## 2021-07-20 DIAGNOSIS — E782 Mixed hyperlipidemia: Secondary | ICD-10-CM | POA: Diagnosis present

## 2021-07-20 DIAGNOSIS — R9431 Abnormal electrocardiogram [ECG] [EKG]: Secondary | ICD-10-CM | POA: Diagnosis not present

## 2021-07-20 DIAGNOSIS — Z91013 Allergy to seafood: Secondary | ICD-10-CM

## 2021-07-20 DIAGNOSIS — K219 Gastro-esophageal reflux disease without esophagitis: Secondary | ICD-10-CM | POA: Diagnosis present

## 2021-07-20 DIAGNOSIS — T6592XA Toxic effect of unspecified substance, intentional self-harm, initial encounter: Secondary | ICD-10-CM

## 2021-07-20 DIAGNOSIS — J45909 Unspecified asthma, uncomplicated: Secondary | ICD-10-CM | POA: Diagnosis present

## 2021-07-20 HISTORY — DX: Poisoning by unspecified drugs, medicaments and biological substances, intentional self-harm, initial encounter: T50.902A

## 2021-07-20 LAB — CBC WITH DIFFERENTIAL/PLATELET
Abs Immature Granulocytes: 0.03 10*3/uL (ref 0.00–0.07)
Basophils Absolute: 0 10*3/uL (ref 0.0–0.1)
Basophils Relative: 0 %
Eosinophils Absolute: 0.1 10*3/uL (ref 0.0–0.5)
Eosinophils Relative: 1 %
HCT: 33.9 % — ABNORMAL LOW (ref 36.0–46.0)
Hemoglobin: 10.6 g/dL — ABNORMAL LOW (ref 12.0–15.0)
Immature Granulocytes: 0 %
Lymphocytes Relative: 32 %
Lymphs Abs: 2.7 10*3/uL (ref 0.7–4.0)
MCH: 25.9 pg — ABNORMAL LOW (ref 26.0–34.0)
MCHC: 31.3 g/dL (ref 30.0–36.0)
MCV: 82.9 fL (ref 80.0–100.0)
Monocytes Absolute: 0.5 10*3/uL (ref 0.1–1.0)
Monocytes Relative: 6 %
Neutro Abs: 5 10*3/uL (ref 1.7–7.7)
Neutrophils Relative %: 61 %
Platelets: 272 10*3/uL (ref 150–400)
RBC: 4.09 MIL/uL (ref 3.87–5.11)
RDW: 14.4 % (ref 11.5–15.5)
WBC: 8.3 10*3/uL (ref 4.0–10.5)
nRBC: 0 % (ref 0.0–0.2)

## 2021-07-20 LAB — RESP PANEL BY RT-PCR (FLU A&B, COVID) ARPGX2
Influenza A by PCR: NEGATIVE
Influenza B by PCR: NEGATIVE
SARS Coronavirus 2 by RT PCR: NEGATIVE

## 2021-07-20 LAB — COMPREHENSIVE METABOLIC PANEL
ALT: 15 U/L (ref 0–44)
AST: 17 U/L (ref 15–41)
Albumin: 3.6 g/dL (ref 3.5–5.0)
Alkaline Phosphatase: 53 U/L (ref 38–126)
Anion gap: 8 (ref 5–15)
BUN: 16 mg/dL (ref 6–20)
CO2: 21 mmol/L — ABNORMAL LOW (ref 22–32)
Calcium: 8.9 mg/dL (ref 8.9–10.3)
Chloride: 111 mmol/L (ref 98–111)
Creatinine, Ser: 0.61 mg/dL (ref 0.44–1.00)
GFR, Estimated: >60 mL/min (ref >60)
Glucose, Bld: 94 mg/dL (ref 70–99)
Potassium: 3.9 mmol/L (ref 3.5–5.1)
Sodium: 140 mmol/L (ref 135–145)
Total Bilirubin: 0.5 mg/dL (ref 0.3–1.2)
Total Protein: 6.3 g/dL — ABNORMAL LOW (ref 6.5–8.1)

## 2021-07-20 LAB — POCT URINE DRUG SCREEN - MANUAL ENTRY (I-SCREEN)
POC Amphetamine UR: NOT DETECTED
POC Buprenorphine (BUP): NOT DETECTED
POC Cocaine UR: NOT DETECTED
POC Marijuana UR: NOT DETECTED
POC Methadone UR: NOT DETECTED
POC Methamphetamine UR: NOT DETECTED
POC Morphine: NOT DETECTED
POC Oxazepam (BZO): POSITIVE — AB
POC Oxycodone UR: NOT DETECTED
POC Secobarbital (BAR): NOT DETECTED

## 2021-07-20 LAB — SALICYLATE LEVEL: Salicylate Lvl: <7 mg/dL — ABNORMAL LOW (ref 7.0–30.0)

## 2021-07-20 LAB — ACETAMINOPHEN LEVEL: Acetaminophen (Tylenol), Serum: <10 ug/mL — ABNORMAL LOW (ref 10–30)

## 2021-07-20 LAB — I-STAT BETA HCG BLOOD, ED (MC, WL, AP ONLY): I-stat hCG, quantitative: <5 m[IU]/mL (ref <5)

## 2021-07-20 LAB — POCT PREGNANCY, URINE: Preg Test, Ur: NEGATIVE

## 2021-07-20 LAB — CBG MONITORING, ED: Glucose-Capillary: 91 mg/dL (ref 70–99)

## 2021-07-20 LAB — POC SARS CORONAVIRUS 2 AG: SARSCOV2ONAVIRUS 2 AG: NEGATIVE

## 2021-07-20 LAB — ETHANOL: Alcohol, Ethyl (B): <10 mg/dL (ref <10)

## 2021-07-20 MED ORDER — ALBUTEROL SULFATE (2.5 MG/3ML) 0.083% IN NEBU: 3.0000 mL | INHALATION_SOLUTION | Freq: Four times a day (QID) | RESPIRATORY_TRACT | Status: DC | PRN

## 2021-07-20 MED ORDER — CHARCOAL ACTIVATED PO LIQD
50.0000 g | Freq: Once | ORAL | Status: AC
  Administered 2021-07-20: 50 g via ORAL
  Filled 2021-07-20: qty 240

## 2021-07-20 MED ORDER — QUETIAPINE FUMARATE 50 MG PO TABS
50.0000 mg | ORAL_TABLET | Freq: Every day | ORAL | Status: DC
  Administered 2021-07-20: 50 mg via ORAL
  Filled 2021-07-20: qty 1

## 2021-07-20 MED ORDER — MELATONIN 3 MG PO TABS
3.0000 mg | ORAL_TABLET | Freq: Every day | ORAL | Status: DC
  Administered 2021-07-20: 3 mg via ORAL
  Filled 2021-07-20: qty 1

## 2021-07-20 MED ORDER — ENOXAPARIN SODIUM 40 MG/0.4ML IJ SOSY
40.0000 mg | PREFILLED_SYRINGE | INTRAMUSCULAR | Status: DC
  Administered 2021-07-21: 40 mg via SUBCUTANEOUS
  Filled 2021-07-20 (×4): qty 0.4

## 2021-07-20 MED ORDER — GABAPENTIN 300 MG PO CAPS
300.0000 mg | ORAL_CAPSULE | Freq: Three times a day (TID) | ORAL | Status: DC
  Administered 2021-07-20: 300 mg via ORAL
  Filled 2021-07-20: qty 1

## 2021-07-20 MED ORDER — SODIUM CHLORIDE 0.9 % IV BOLUS
1000.0000 mL | Freq: Once | INTRAVENOUS | Status: AC
Start: 2021-07-20 — End: 2021-07-21
  Administered 2021-07-21: 1000 mL via INTRAVENOUS

## 2021-07-20 MED ORDER — HYDROXYZINE HCL 25 MG PO TABS
25.0000 mg | ORAL_TABLET | Freq: Three times a day (TID) | ORAL | Status: DC | PRN
  Administered 2021-07-20: 25 mg via ORAL
  Filled 2021-07-20: qty 1

## 2021-07-20 MED ORDER — SODIUM CHLORIDE 0.9% FLUSH
3.0000 mL | Freq: Two times a day (BID) | INTRAVENOUS | Status: DC
  Administered 2021-07-21 – 2021-07-22 (×2): 3 mL via INTRAVENOUS

## 2021-07-20 MED ORDER — MOMETASONE FURO-FORMOTEROL FUM 200-5 MCG/ACT IN AERO
2.0000 | INHALATION_SPRAY | Freq: Two times a day (BID) | RESPIRATORY_TRACT | Status: DC
  Administered 2021-07-21 – 2021-07-23 (×2): 2 via RESPIRATORY_TRACT
  Filled 2021-07-20 (×2): qty 8.8

## 2021-07-20 MED ORDER — TRAZODONE HCL 50 MG PO TABS
50.0000 mg | ORAL_TABLET | Freq: Every evening | ORAL | Status: DC | PRN
  Administered 2021-07-20: 50 mg via ORAL
  Filled 2021-07-20: qty 1

## 2021-07-20 NOTE — ED Triage Notes (Signed)
Pt arrived via EMS from home after pts roommate called with concerns pt had attempted to OD on pills, when EMS arrived pt was sitting upright next to emesis which appeared to contain a lot of pills.   Pt stated she took all of the following pills because she doesn't want to live anymore: PANTOPRAZOLE 29 X '10MG'$   '2900MG'$  Vraylar 14x '3mg'$   '42mg'$  Lexapro  14x  '10mg'$   '140mg'$  Seoquel  14 x '50mg'$   '700mg'$  Gabapentin  44x '300mg'$    13,'200mg'$ 

## 2021-07-20 NOTE — ED Notes (Signed)
Patient is being very disruptive by clapping her hands loudly and clearing her throat loud. Staff spoke with her multiple times on how to respect the other people who are here and to lie down and rest.

## 2021-07-20 NOTE — ED Notes (Signed)
Nosey and disruptive on unit waking other pt's with sing and clapping of hands when confronted by staff she requested discharge home. Provider Mardene Sayer. NP made aware of pt request stated he was ok with her leaving AMA. AMA form signed by Judson Roch and witnessed by this Probation officer and [employer] called for transport back home. [name] was given all her belongings and escorted to door by staff to wait for [employer] to transport.

## 2021-07-20 NOTE — ED Provider Notes (Signed)
[facility] EMERGENCY DEPARTMENT Provider Note   CSN: [id number] Arrival date & time: 07/20/21  2056     History  Chief Complaint  Patient presents with   Ingestion   Suicide Attempt    [name] is a 31 y.o. female.  Patient is a 31 year old female with a history of depression, bipolar disorder, ADD and autism who presents after an overdose.  Per EMS, she took a large handful of multiple pills.  She said that she took these pills in attempt to kill herself.  She had an episode of vomiting onsite with EMS who reports that there were multiple pills in the emesis.  She says that she still feels nauseated but denies any other symptoms.  She has attempted suicide in the past.  The following pills that were reported that she took were  pantoprazole 10 mg tablets, 29 tablets Vraylar 3 mg tablets, 14 tablets Lexapro 10 mg tablets, 14 tablets Seroquel 50 mg tablets, 14 tablets Gabapentin 300 mg tablets, 44 tablets       Home Medications Prior to Admission medications   Medication Sig Start Date End Date Taking? Authorizing Provider  albuterol (VENTOLIN HFA) 108 (90 Base) MCG/ACT inhaler Inhale 2 puffs into the lungs every 6 (six) hours as needed for wheezing or shortness of breath.    [provider]  BIOTIN PO Take 3 capsules by mouth daily.    [provider]  budesonide-formoterol (SYMBICORT) 160-4.5 MCG/ACT inhaler Inhale 2 puffs into the lungs 2 (two) times daily.    [provider]  calcium carbonate (TUMS EX) 750 MG chewable tablet Chew 1,500 mg by mouth 2 (two) times daily as needed for heartburn.    [provider]  cariprazine (VRAYLAR) 3 MG capsule Take 1 capsule (3 mg total) by mouth daily. 07/12/21   Massengill, Ovid Curd, MD  Cyanocobalamin (B-12 PO) Take 1 tablet by mouth daily.    [provider]  diclofenac Sodium (VOLTAREN) 1 % GEL Apply 2 g topically 4 (four) times daily. 06/23/21   Harris,  Abigail, PA-C  escitalopram (LEXAPRO) 10 MG tablet Take 1 tablet (10 mg total) by mouth daily. 07/11/21   Massengill, Ovid Curd, MD  gabapentin (NEURONTIN) 300 MG capsule Take 1 capsule (300 mg total) by mouth 3 (three) times daily for 7 days. 07/11/21 07/18/21  Massengill, Ovid Curd, MD  hydrOXYzine (ATARAX) 25 MG tablet Take 1 tablet (25 mg total) by mouth 3 (three) times daily as needed for anxiety. 07/17/21   Armando Reichert, MD  ibuprofen (ADVIL) 200 MG tablet Take 600 mg by mouth every 6 (six) hours as needed for headache or mild pain.    [provider]  Lidocaine-Menthol (ICY HOT MAX LIDOCAINE EX) Apply 1 Application topically daily as needed (pain).    [provider]  melatonin 3 MG TABS tablet Take 3 mg by mouth at bedtime.    [provider]  Multiple Vitamin (MULTIVITAMIN WITH MINERALS) TABS tablet Take 1 tablet by mouth daily. 07/17/21   Armando Reichert, MD  neomycin-bacitracin-polymyxin [phone] OINT Apply 1 application  topically 2 (two) times daily as needed for up to 5 days (apply to scratches on bilateral arms). 07/17/21 07/22/21  Armando Reichert, MD  pantoprazole (PROTONIX) 40 MG tablet TAKE 1 TABLET BY MOUTH DAILY Patient taking differently: 40 mg daily. 04/26/21   Jeanie Sewer, NP  propranolol (INDERAL) 10 MG tablet Take 1 tablet (10 mg total) by mouth 2 (two) times daily. 07/11/21   Massengill, Ovid Curd,  MD  QUEtiapine (SEROQUEL) 50 MG tablet Take 1 tablet (50 mg total) by mouth at bedtime for 7 days. 07/11/21 07/18/21  Massengill, Ovid Curd, MD  thiamine 100 MG tablet Take 1 tablet (100 mg total) by mouth daily. 07/17/21   Armando Reichert, MD      Allergies    Bee venom, Coconut flavor, Geodon [ziprasidone hcl], Haloperidol and related, Lithobid [lithium], Other, Roxicodone [oxycodone], Seroquel [quetiapine], Shellfish allergy, Phenergan [promethazine hcl], Prilosec [omeprazole], Sulfa antibiotics, Tegretol [carbamazepine], Prozac [fluoxetine], Tape, and Tylenol  [acetaminophen]    Review of Systems   Review of Systems  Constitutional:  Negative for chills, diaphoresis, fatigue and fever.  HENT:  Negative for congestion, rhinorrhea and sneezing.   Eyes: Negative.   Respiratory:  Negative for cough, chest tightness and shortness of breath.   Cardiovascular:  Negative for chest pain and leg swelling.  Gastrointestinal:  Positive for nausea and vomiting. Negative for abdominal pain, blood in stool and diarrhea.  Genitourinary:  Negative for difficulty urinating, flank pain, frequency and hematuria.  Musculoskeletal:  Negative for arthralgias and back pain.  Skin:  Negative for rash.  Neurological:  Negative for dizziness, speech difficulty, weakness, numbness and headaches.    Physical Exam Updated Vital Signs BP 109/68   Pulse 72   Resp 15   SpO2 98%  Physical Exam Constitutional:      Appearance: She is well-developed.  HENT:     Head: Normocephalic and atraumatic.  Eyes:     Pupils: Pupils are equal, round, and reactive to light.  Cardiovascular:     Rate and Rhythm: Normal rate and regular rhythm.     Heart sounds: Normal heart sounds.  Pulmonary:     Effort: Pulmonary effort is normal. No respiratory distress.     Breath sounds: Normal breath sounds. No wheezing or rales.  Chest:     Chest wall: No tenderness.  Abdominal:     General: Bowel sounds are normal.     Palpations: Abdomen is soft.     Tenderness: There is no abdominal tenderness. There is no guarding or rebound.  Musculoskeletal:        General: Normal range of motion.     Cervical back: Normal range of motion and neck supple.  Lymphadenopathy:     Cervical: No cervical adenopathy.  Skin:    General: Skin is warm and dry.     Findings: No rash.  Neurological:     Mental Status: She is alert and oriented to person, place, and time.     ED Results / Procedures / Treatments   Labs (all labs ordered are listed, but only abnormal results are displayed) Labs  Reviewed  COMPREHENSIVE METABOLIC PANEL - Abnormal; Notable for the following components:      Result Value   CO2 21 (*)    Total Protein 6.3 (*)    All other components within normal limits  SALICYLATE LEVEL - Abnormal; Notable for the following components:   Salicylate Lvl <9.6 (*)    All other components within normal limits  ACETAMINOPHEN LEVEL - Abnormal; Notable for the following components:   Acetaminophen (Tylenol), Serum <10 (*)    All other components within normal limits  CBC WITH DIFFERENTIAL/PLATELET - Abnormal; Notable for the following components:   Hemoglobin 10.6 (*)    HCT 33.9 (*)    MCH 25.9 (*)    All other components within normal limits  ETHANOL  RAPID URINE DRUG SCREEN, HOSP PERFORMED  COMPREHENSIVE METABOLIC PANEL  CBC  CBG MONITORING, ED  I-STAT BETA HCG BLOOD, ED ([facility], [facility], [facility] ONLY)    EKG EKG Interpretation  Date/Time:  Thursday July 20 2021 21:18:44 EDT Ventricular Rate:  85 PR Interval:  142 QRS Duration: 94 QT Interval:  384 QTC Calculation: 457 R Axis:   32 Text Interpretation: Sinus rhythm Anterolateral Q wave, probably normal for age Confirmed by Malvin Johns [phone]) on 07/20/2021 9:32:10 PM  Radiology No results found.  Procedures Procedures    Medications Ordered in ED Medications  albuterol (PROVENTIL) (2.5 MG/3ML) 0.083% nebulizer solution 3 mL (has no administration in time range)  mometasone-formoterol (DULERA) 200-5 MCG/ACT inhaler 2 puff (has no administration in time range)  enoxaparin (LOVENOX) injection 40 mg (has no administration in time range)  sodium chloride flush (NS) 0.9 % injection 3 mL (has no administration in time range)  charcoal activated (NO SORBITOL) (ACTIDOSE-AQUA) suspension 50 g (50 g Oral Given 07/20/21 2235)    ED Course/ Medical Decision Making/ A&P                           Medical Decision Making Amount and/or Complexity of Data Reviewed Labs: ordered.  Risk OTC drugs. Decision regarding  hospitalization.   Patient is a 31 year old female who presents after an overdose.  She took a large number of pills in an attempt to kill herself.  She did vomit up some of the pills.  Currently she is awake and alert and just complaining of some nausea.  She had 1 episode of vomiting prior to arrival.  Her EKG does not show QT prolongation or arrhythmias.  Her labs are nonconcerning.  Her aspirin and Tylenol level are negative.  She has not had any arrhythmias noted on the monitor.  I consulted with poison control.  They advised the main effects to watch out for are CNS depression, hypotension and QT prolongation.  They did recommend activated charcoal.  However patient is refusing to take this.  They recommended 10-hour observation.  And a repeat EKG prior to medically clearing the patient.  Patient will need TTS consult following the observation.  IVC papers were initiated.  I spoke with Dr. Trilby Drummer who will admit the patient for further treatment.  Final Clinical Impression(s) / ED Diagnoses Final diagnoses:  Suicide attempt ([place])  Ingestion of substance, intentional self-harm, initial encounter [facility])    Rx / DC Orders ED Discharge Orders     None         Malvin Johns, MD 07/20/21 2323

## 2021-07-20 NOTE — H&P (Signed)
History and Physical   [name] WJX:[id number] DOB: 09-Dec-1990 DOA: 07/20/2021  PCP: [facility] [place]   Patient coming from: Home  Chief Complaint: Suicide attempt  HPI: [name] is a 31 y.o. female with medical history significant of depression, anxiety, paranoia, schizoaffective disorder, adjustment disorder, borderline personality sorter, PTSD, bipolar disorder, history of suicide attempt and overdose, autism, asthma, migraine, seizure, dysfunctional uterine bleeding presenting after suicide attempt.  Patient attempted suicide with overdose of multiple medications.  EMS was called by roommate after event. She endorses argument with her "ex" but does not go into detail about it. On EMS arrival there was emesis beside patient with many pills in it.  She reportedly took the below medications:  Pantoprazole 29x '10MG'$ ,  '2900MG'$  Vraylar 14x '3mg'$ ,  '42mg'$  Lexapro  14x  '10mg'$ ,  '140mg'$  Seoquel  14 x '50mg'$ ,  '700mg'$  Gabapentin  44x '300mg'$ ,   13,'200mg'$   She confirms that she did this is attempted suicide and that she does not want to live.  Recently in the hospital observed in the ED from 6/13 - 6/14 for suicide attempt by cutting her neck.  Neck wound did not require any sutures.  Psych was consulted and patient was not deemed appropriate for inpatient psych.  Plan for increased frequency of outpatient support.  She denies fevers, chills, chest pain, shortness of breath, abdominal pain, constipation or diarrhea, nausea, vomiting.  ED Course: Vital signs in the ED stable.  Lab work-up included CMP with bicarb 21, protein 6.3.  CBC with hemoglobin stable 10.6.  UDS pending.  Aspirin level and Tylenol level normal.  Ethanol level normal.  Patient was ordered a dose of activated charcoal but this was declined.  Poison control was consulted and recommended at least 10 hours of observation overnight and monitoring for CNS depression or hypotension.  Also recommended the above  activated charcoal.  Recommended initial EKG and repeat EKG after observation.  ED requesting observation on hospitalist service overnight due to required duration of medical observation.  Review of Systems: As per HPI otherwise all other systems reviewed and are negative.  Past Medical History:  Diagnosis Date   Acid reflux    Anxiety    Asthma    last attack 03/13/15 or 03/14/15   Autism    Carrier of fragile X syndrome    Chronic constipation    Depression    Drug-seeking behavior    Essential tremor    Headache    Overdose of acetaminophen 07/2017   and other meds   Personality disorder ([place])    Schizo-affective psychosis ([place])    Schizoaffective disorder, bipolar type ([place])    Seizures ([place])    Last seizure December 2017   Sleep apnea     Past Surgical History:  Procedure Laterality Date   MOUTH SURGERY  2009 or 2010    Social History  reports that she has quit smoking. Her smoking use included cigarettes. She has never used smokeless tobacco. She reports that she does not drink alcohol and does not use drugs.  Allergies  Allergen Reactions   Bee Venom Anaphylaxis   Coconut Flavor Anaphylaxis and Rash   Geodon [Ziprasidone Hcl] Other (See Comments)    Pt states that this medication causes paralysis of the mouth.     Haloperidol And Related Other (See Comments)    Pt states that this medication causes paralysis of the mouth, jaw locks up   Lithobid [Lithium] Other (See Comments)    Seizure-like activity  Other Anaphylaxis and Other (See Comments)    Coconut (fruit) - Anaphylaxis (Pt allergic to both coconut flavor and fruit)   Roxicodone [Oxycodone] Other (See Comments)    Hallucinations    Seroquel [Quetiapine] Other (See Comments)    Severe drowsiness Pt currently taking 50 mg, she is ok with this strength, she does not want higher dose.   Shellfish Allergy Anaphylaxis   Phenergan [Promethazine Hcl] Other (See Comments)    Chest pain     Prilosec  [Omeprazole] Nausea And Vomiting   Sulfa Antibiotics Other (See Comments)    Chest pain    Tegretol [Carbamazepine] Nausea And Vomiting   Prozac [Fluoxetine] Other (See Comments)    Increased Depression Suicidal thoughts   Tape Other (See Comments)    Skin tears, can only tolerate paper tape.   Tylenol [Acetaminophen] Other (See Comments)    Unknown reaction    Family History  Problem Relation Age of Onset   Mental illness Father    Asthma Father    PDD Brother    Seizures Brother   Reviewed on admission  Prior to Admission medications   Medication Sig Start Date End Date Taking? Authorizing Provider  albuterol (VENTOLIN HFA) 108 (90 Base) MCG/ACT inhaler Inhale 2 puffs into the lungs every 6 (six) hours as needed for wheezing or shortness of breath.    [provider]  BIOTIN PO Take 3 capsules by mouth daily.    [provider]  budesonide-formoterol (SYMBICORT) 160-4.5 MCG/ACT inhaler Inhale 2 puffs into the lungs 2 (two) times daily.    [provider]  calcium carbonate (TUMS EX) 750 MG chewable tablet Chew 1,500 mg by mouth 2 (two) times daily as needed for heartburn.    [provider]  cariprazine (VRAYLAR) 3 MG capsule Take 1 capsule (3 mg total) by mouth daily. 07/12/21   Massengill, Ovid Curd, MD  Cyanocobalamin (B-12 PO) Take 1 tablet by mouth daily.    [provider]  diclofenac Sodium (VOLTAREN) 1 % GEL Apply 2 g topically 4 (four) times daily. 06/23/21   Harris, Abigail, PA-C  escitalopram (LEXAPRO) 10 MG tablet Take 1 tablet (10 mg total) by mouth daily. 07/11/21   Massengill, Ovid Curd, MD  gabapentin (NEURONTIN) 300 MG capsule Take 1 capsule (300 mg total) by mouth 3 (three) times daily for 7 days. 07/11/21 07/18/21  Massengill, Ovid Curd, MD  hydrOXYzine (ATARAX) 25 MG tablet Take 1 tablet (25 mg total) by mouth 3 (three) times daily as needed for anxiety. 07/17/21   Armando Reichert, MD  ibuprofen (ADVIL) 200 MG tablet Take 600 mg by mouth  every 6 (six) hours as needed for headache or mild pain.    [provider]  Lidocaine-Menthol (ICY HOT MAX LIDOCAINE EX) Apply 1 Application topically daily as needed (pain).    [provider]  melatonin 3 MG TABS tablet Take 3 mg by mouth at bedtime.    [provider]  Multiple Vitamin (MULTIVITAMIN WITH MINERALS) TABS tablet Take 1 tablet by mouth daily. 07/17/21   Armando Reichert, MD  neomycin-bacitracin-polymyxin [id number] OINT Apply 1 application  topically 2 (two) times daily as needed for up to 5 days (apply to scratches on bilateral arms). 07/17/21 07/22/21  Armando Reichert, MD  pantoprazole (PROTONIX) 40 MG tablet TAKE 1 TABLET BY MOUTH DAILY Patient taking differently: 40 mg daily. 04/26/21   Jeanie Sewer, NP  propranolol (INDERAL) 10 MG tablet Take 1 tablet (10 mg total) by mouth 2 (two) times daily. 07/11/21  Massengill, Ovid Curd, MD  QUEtiapine (SEROQUEL) 50 MG tablet Take 1 tablet (50 mg total) by mouth at bedtime for 7 days. 07/11/21 07/18/21  Massengill, Ovid Curd, MD  thiamine 100 MG tablet Take 1 tablet (100 mg total) by mouth daily. 07/17/21   Armando Reichert, MD    Physical Exam: Vitals:   07/20/21 2104 07/20/21 2107 07/20/21 2108 07/20/21 2215  BP:   111/68 109/68  Pulse:  84  72  Resp:  20  15  SpO2: 98% 99%  98%    Physical Exam Constitutional:      General: She is not in acute distress.    Comments: Drowsy appearing  HENT:     Head: Normocephalic and atraumatic.     Mouth/Throat:     Mouth: Mucous membranes are moist.     Pharynx: Oropharynx is clear.  Eyes:     Extraocular Movements: Extraocular movements intact.     Pupils: Pupils are equal, round, and reactive to light.  Cardiovascular:     Rate and Rhythm: Normal rate and regular rhythm.     Pulses: Normal pulses.     Heart sounds: Normal heart sounds.  Pulmonary:     Effort: Pulmonary effort is normal. No respiratory distress.     Breath sounds: Normal breath sounds.  Abdominal:      General: Bowel sounds are normal. There is no distension.     Palpations: Abdomen is soft.     Tenderness: There is no abdominal tenderness.  Musculoskeletal:        General: No swelling or deformity.  Skin:    General: Skin is warm and dry.  Neurological:     General: No focal deficit present.     Mental Status: Mental status is at baseline.    Labs on Admission: I have personally reviewed following labs and imaging studies  CBC: Recent Labs  Lab 07/18/21 0920 07/20/21 2130  WBC 8.4 8.3  NEUTROABS 5.0 5.0  HGB 11.7* 10.6*  HCT 37.5 33.9*  MCV 84.1 82.9  PLT 239 601    Basic Metabolic Panel: Recent Labs  Lab 07/18/21 0920 07/20/21 2130  NA 138 140  K 3.8 3.9  CL 109 111  CO2 22 21*  GLUCOSE 99 94  BUN 15 16  CREATININE 0.60 0.61  CALCIUM 9.2 8.9    GFR: Estimated Creatinine Clearance: 116.3 mL/min (by C-G formula based on SCr of 0.61 mg/dL).  Liver Function Tests: Recent Labs  Lab 07/18/21 0920 07/20/21 2130  AST 16 17  ALT 16 15  ALKPHOS 56 53  BILITOT 0.3 0.5  PROT 6.8 6.3*  ALBUMIN 3.9 3.6    Urine analysis:    Component Value Date/Time   COLORURINE STRAW (A) 05/29/2021 2348   APPEARANCEUR CLEAR 05/29/2021 2348   LABSPEC 1.010 05/29/2021 2348   PHURINE 6.0 05/29/2021 2348   GLUCOSEU NEGATIVE 05/29/2021 2348   HGBUR NEGATIVE 05/29/2021 2348   BILIRUBINUR NEGATIVE 05/29/2021 2348   BILIRUBINUR negative 06/14/2015 0930   KETONESUR NEGATIVE 05/29/2021 2348   PROTEINUR NEGATIVE 05/29/2021 2348   UROBILINOGEN 0.2 10/24/2015 1428   NITRITE NEGATIVE 05/29/2021 2348   LEUKOCYTESUR NEGATIVE 05/29/2021 2348    Radiological Exams on Admission: No results found.  EKG: Independently reviewed.  Sinus rhythm at 85 bpm.  Lateral Q waves.  Similar to previous.  Assessment/Plan Principal Problem:   Overdose, intentional self-harm, initial encounter [facility]) Active Problems:   Schizoaffective disorder ([place])   Adjustment disorder with mixed  disturbance of emotions and conduct  Borderline personality disorder ([place])   PTSD (post-traumatic stress disorder)   Asthma   Migraines   Seizure disorder ([place])   MDD (major depressive disorder), recurrent episode, severe ([place])   GAD (generalized anxiety disorder)   Paranoia ([place])   Bipolar I disorder, current or most recent episode depressed, with psychotic features ([place])   Overdose Suicide attempt Depression Anxiety Schizoaffective Borderline personality PTSD Bipolar > Patient presenting after suicide attempt with multiple pills as per HPI. (Protonix, Vraylar, Lexapro, Seroquel, gabapentin) > Previous to this he had another suicide attempt in the preceding couple of days, reportedly with a knife to her neck not go too deep per records. > Poison control was consulted and recommended at least 10 hours of monitoring and hospital service asked to put her on our service due to duration of medical monitoring required. > Monitoring for EKG abnormalities, CNS depression, hypotension > Activated charcoal was recommended but declined by patient. > IVC paperwork is being filled out by EDP - Monitor on telemetry - Psychiatry consult - Repeat EKG in the morning - Follow-up UDS - Safety sitter - Holding home meds considering overdose  Asthma - Replace home inhaler with formulary Dulera - As needed albuterol  DVT prophylaxis: Lovenox Code Status:   Full Family Communication:  None on admission  Disposition Plan:   Patient is from:  Home  Anticipated DC to:  Pending clinical course  Anticipated DC date:  1 to 2 days  Anticipated DC barriers: Possible requirement of inpatient psych  Consults called:  Message sent to psychiatry for evaluation in the morning Admission status:  Observation, telemetry  Severity of Illness: The appropriate patient status for this patient is OBSERVATION. Observation status is judged to be reasonable and necessary in order to provide the required intensity  of service to ensure the patient's safety. The patient's presenting symptoms, physical exam findings, and initial radiographic and laboratory data in the context of their medical condition is felt to place them at decreased risk for further clinical deterioration. Furthermore, it is anticipated that the patient will be medically stable for discharge from the hospital within 2 midnights of admission.    Marcelyn Bruins MD [facility]  How to contact the [facility] Attending or Consulting provider [place] or covering provider during after hours [place], for this patient?   Check the care team in [facility] and look for a) attending/consulting [facility] provider listed and b) the [facility] team listed Log into [email or web address] and use [place]'s universal password to access. If you do not have the password, please contact the hospital operator. Locate the [facility] provider you are looking for under [facility] and page to a number that you can be directly reached. If you still have difficulty reaching the provider, please page the [facility] (Director on Call) for the Hospitalists listed on amion for assistance.  07/20/2021, 11:17 PM
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
## 2021-07-20 NOTE — Progress Notes (Signed)
   07/19/21 2302  Patient Reported Information  How Did You Hear About [name]? Legal System  What Is the Reason for Your Visit/Call Today? Pt reports, she tried cutting her neck yesterday as a suicide attempt. Clinician observed a cut on the pt's neck. Pt reports, she can't contract for safety.  How Long Has This Been Causing You Problems? > than 6 months  What Do You Feel Would Help You the Most Today? Treatment for Depression or other mood problem;Medication(s);Financial Resources  Have You Recently Had Any Thoughts About Hurting Yourself? Yes  Are You Planning to Commit Suicide/Harm Yourself At This time? Yes  Have you Recently Had Thoughts About Hurting Someone [name]? No  Are You Planning To Harm Someone At This Time? No  Have You Used Any Alcohol or Drugs in the Past 24 Hours? No  What Did You Use and How Much? Pt denies.  Do You Currently Have a Therapist/Psychiatrist? Yes  Name of Therapist/Psychiatrist Pt reports, seeing a psychatrist and therapist through her CST Team.  CCA Screening Triage Referral Assessment  Type of Contact Face-to-Face  Is this Initial or Reassessment? Initial Assessment  Provider location [facility] Services  Collateral Involvement Pt denies, having collaterals.  Does Patient Have a Stage manager Guardian? No  Is CPS involved or ever been involved? In the Past (Pt reports, her daughter is with her mother-in-law.)  Patient Determined To Be At Risk for Harm To Self or Others Based on Review of Patient Reported Information or Presenting Complaint? Yes, for Self-Harm  Contacted To Inform of Risk of Harm To Self or Others: Law Enforcement  Does Patient Present under Involuntary Commitment? No  [place] of Residence [name]  Patient Currently Receiving the Following Services: Individual Therapy;Medication Management;CST Marine scientist)  Determination of Need Emergent (2 hours)  Options For Referral Medication Management;Outpatient  Therapy;Inpatient Hospitalization;[facility] Urgent Care    Determination of need: Emergent.    Vertell Novak, [place], [facility], [facility] Triage Specialist [phone]

## 2021-07-20 NOTE — ED Notes (Signed)
[name] is very focused on being hospitalized asking several times during our conversation if their is a plan to send her to [name] on [name] [address]. She believes everyone is focused on her  as evidence by her complaining that staff members were laughing at her and was made aware that just because you hear people laughing doesn't mean they are laughing at you, you are not the focus of everyone's life. She stated she miss understood them. Then she requested from other staff members that she wanted to be discharged  because she had a job interview in the morning because she has to work to pay her bills. She was asked directly do you want to be admitted to [name] she said only if your going to send me to the [name]. I informed her their currently is no plan to do that so if she wants discharge it is up to her. She stated " I want to be admitted.". Skin check was completed by female staff and [name] was brought to adult bed #6.

## 2021-07-21 ENCOUNTER — Encounter (HOSPITAL_COMMUNITY): Payer: Self-pay | Admitting: Internal Medicine

## 2021-07-21 ENCOUNTER — Other Ambulatory Visit: Payer: Self-pay

## 2021-07-21 DIAGNOSIS — F4325 Adjustment disorder with mixed disturbance of emotions and conduct: Secondary | ICD-10-CM | POA: Diagnosis not present

## 2021-07-21 DIAGNOSIS — G40909 Epilepsy, unspecified, not intractable, without status epilepticus: Secondary | ICD-10-CM | POA: Diagnosis not present

## 2021-07-21 DIAGNOSIS — D649 Anemia, unspecified: Secondary | ICD-10-CM | POA: Diagnosis not present

## 2021-07-21 DIAGNOSIS — F333 Major depressive disorder, recurrent, severe with psychotic symptoms: Secondary | ICD-10-CM

## 2021-07-21 DIAGNOSIS — G43009 Migraine without aura, not intractable, without status migrainosus: Secondary | ICD-10-CM

## 2021-07-21 DIAGNOSIS — F603 Borderline personality disorder: Secondary | ICD-10-CM | POA: Diagnosis not present

## 2021-07-21 DIAGNOSIS — J4520 Mild intermittent asthma, uncomplicated: Secondary | ICD-10-CM | POA: Diagnosis not present

## 2021-07-21 DIAGNOSIS — T50902A Poisoning by unspecified drugs, medicaments and biological substances, intentional self-harm, initial encounter: Secondary | ICD-10-CM | POA: Diagnosis not present

## 2021-07-21 DIAGNOSIS — F22 Delusional disorders: Secondary | ICD-10-CM | POA: Diagnosis not present

## 2021-07-21 DIAGNOSIS — F251 Schizoaffective disorder, depressive type: Secondary | ICD-10-CM | POA: Diagnosis not present

## 2021-07-21 DIAGNOSIS — F315 Bipolar disorder, current episode depressed, severe, with psychotic features: Secondary | ICD-10-CM | POA: Diagnosis not present

## 2021-07-21 DIAGNOSIS — F4310 Post-traumatic stress disorder, unspecified: Secondary | ICD-10-CM | POA: Diagnosis not present

## 2021-07-21 LAB — CBC
HCT: 31.7 % — ABNORMAL LOW (ref 36.0–46.0)
Hemoglobin: 9.8 g/dL — ABNORMAL LOW (ref 12.0–15.0)
MCH: 26 pg (ref 26.0–34.0)
MCHC: 30.9 g/dL (ref 30.0–36.0)
MCV: 84.1 fL (ref 80.0–100.0)
Platelets: 242 10*3/uL (ref 150–400)
RBC: 3.77 MIL/uL — ABNORMAL LOW (ref 3.87–5.11)
RDW: 14.6 % (ref 11.5–15.5)
WBC: 6.9 10*3/uL (ref 4.0–10.5)
nRBC: 0 % (ref 0.0–0.2)

## 2021-07-21 LAB — COMPREHENSIVE METABOLIC PANEL
ALT: 12 U/L (ref 0–44)
AST: 16 U/L (ref 15–41)
Albumin: 3.2 g/dL — ABNORMAL LOW (ref 3.5–5.0)
Alkaline Phosphatase: 50 U/L (ref 38–126)
Anion gap: 6 (ref 5–15)
BUN: 13 mg/dL (ref 6–20)
CO2: 22 mmol/L (ref 22–32)
Calcium: 8.5 mg/dL — ABNORMAL LOW (ref 8.9–10.3)
Chloride: 112 mmol/L — ABNORMAL HIGH (ref 98–111)
Creatinine, Ser: 0.61 mg/dL (ref 0.44–1.00)
GFR, Estimated: >60 mL/min (ref >60)
Glucose, Bld: 106 mg/dL — ABNORMAL HIGH (ref 70–99)
Potassium: 3.8 mmol/L (ref 3.5–5.1)
Sodium: 140 mmol/L (ref 135–145)
Total Bilirubin: 0.7 mg/dL (ref 0.3–1.2)
Total Protein: 5.6 g/dL — ABNORMAL LOW (ref 6.5–8.1)

## 2021-07-21 LAB — RAPID URINE DRUG SCREEN, HOSP PERFORMED
Amphetamines: NOT DETECTED
Barbiturates: NOT DETECTED
Benzodiazepines: NOT DETECTED
Cocaine: NOT DETECTED
Opiates: NOT DETECTED
Tetrahydrocannabinol: NOT DETECTED

## 2021-07-21 MED ORDER — LORAZEPAM 1 MG PO TABS
1.0000 mg | ORAL_TABLET | ORAL | Status: DC | PRN
Start: 2021-07-21 — End: 2021-07-25
  Administered 2021-07-21 – 2021-07-22 (×3): 1 mg via ORAL
  Filled 2021-07-21 (×4): qty 1

## 2021-07-21 MED ORDER — DICLOFENAC SODIUM 1 % EX GEL
2.0000 g | Freq: Four times a day (QID) | CUTANEOUS | Status: DC
  Administered 2021-07-21 – 2021-07-24 (×13): 2 g via TOPICAL
  Filled 2021-07-21 (×3): qty 100

## 2021-07-21 MED ORDER — GABAPENTIN 300 MG PO CAPS
300.0000 mg | ORAL_CAPSULE | Freq: Three times a day (TID) | ORAL | Status: DC
  Administered 2021-07-21 – 2021-07-24 (×11): 300 mg via ORAL
  Filled 2021-07-21 (×12): qty 1

## 2021-07-21 MED ORDER — LORAZEPAM 2 MG/ML IJ SOLN
1.0000 mg | INTRAMUSCULAR | Status: DC | PRN
  Filled 2021-07-21: qty 1

## 2021-07-21 MED ORDER — MELATONIN 3 MG PO TABS
3.0000 mg | ORAL_TABLET | Freq: Every day | ORAL | Status: DC
  Administered 2021-07-22 – 2021-07-23 (×2): 3 mg via ORAL
  Filled 2021-07-21 (×4): qty 1

## 2021-07-21 MED ORDER — IBUPROFEN 600 MG PO TABS
600.0000 mg | ORAL_TABLET | Freq: Three times a day (TID) | ORAL | Status: DC | PRN
  Administered 2021-07-21 – 2021-07-23 (×3): 600 mg via ORAL
  Filled 2021-07-21: qty 3
  Filled 2021-07-21 (×2): qty 1

## 2021-07-21 MED ORDER — PROPRANOLOL HCL 20 MG PO TABS
10.0000 mg | ORAL_TABLET | Freq: Two times a day (BID) | ORAL | Status: DC
  Administered 2021-07-22 – 2021-07-24 (×5): 10 mg via ORAL
  Filled 2021-07-21 (×9): qty 1

## 2021-07-21 NOTE — ED Notes (Signed)
ED TO INPATIENT HANDOFF REPORT  ED Nurse Name and Phone #:   S Name/Age/Gender [name] 31 y.o. female Room/Bed: 023C/023C  Code Status   Code Status: Full Code  Home/SNF/Other Home Patient oriented to: self, place, time, and situation Is this baseline? Yes   Triage Complete: Triage complete  Chief Complaint Overdose, intentional self-harm, initial encounter [facility]) [T50.902A]  Triage Note Pt arrived via EMS from home after pts roommate called with concerns pt had attempted to OD on pills, when EMS arrived pt was sitting upright next to emesis which appeared to contain a lot of pills.   Pt stated she took all of the following pills because she doesn't want to live anymore: PANTOPRAZOLE 29 X '10MG'$   '2900MG'$  Vraylar 14x '3mg'$   '42mg'$  Lexapro  14x  '10mg'$   '140mg'$  Seoquel  14 x '50mg'$   '700mg'$  Gabapentin  44x '300mg'$    13,'200mg'$    Allergies Allergies  Allergen Reactions   Bee Venom Anaphylaxis   Coconut Flavor Anaphylaxis and Rash   Geodon [Ziprasidone Hcl] Other (See Comments)    Pt states that this medication causes paralysis of the mouth.     Haloperidol And Related Other (See Comments)    Pt states that this medication causes paralysis of the mouth, jaw locks up   Lithobid [Lithium] Other (See Comments)    Seizure-like activity    Other Anaphylaxis and Other (See Comments)    Coconut (fruit) - Anaphylaxis (Pt allergic to both coconut flavor and fruit)   Roxicodone [Oxycodone] Other (See Comments)    Hallucinations    Seroquel [Quetiapine] Other (See Comments)    Severe drowsiness Pt currently taking 50 mg, she is ok with this strength, she does not want higher dose.   Shellfish Allergy Anaphylaxis   Phenergan [Promethazine Hcl] Other (See Comments)    Chest pain     Prilosec [Omeprazole] Nausea And Vomiting   Sulfa Antibiotics Other (See Comments)    Chest pain    Tegretol [Carbamazepine] Nausea And Vomiting   Prozac [Fluoxetine] Other (See Comments)     Increased Depression Suicidal thoughts   Tape Other (See Comments)    Skin tears, can only tolerate paper tape.   Tylenol [Acetaminophen] Other (See Comments)    Unknown reaction    Level of Care/Admitting Diagnosis ED Disposition     ED Disposition  Admit   Condition  --   Comment  Hospital Area: [place] [[id number]]  Level of Care: Telemetry Cardiac [[id number]]  May place patient in observation at [facility] or [place] if equivalent level of care is available:: No  Covid Evaluation: Asymptomatic - no recent exposure (last 10 days) testing not required  Diagnosis: Overdose, intentional self-harm, initial encounter [facility]) [[id number]]  Admitting Physician: Marcelyn Bruins [[id number]]  Attending Physician: Marcelyn Bruins [[id number]]          B Medical/Surgery History Past Medical History:  Diagnosis Date   Acid reflux    Anxiety    Asthma    last attack 03/13/15 or 03/14/15   Autism    Carrier of fragile X syndrome    Chronic constipation    Depression    Drug-seeking behavior    Essential tremor    Headache    Overdose of acetaminophen 07/2017   and other meds   Personality disorder ([place])    Schizo-affective psychosis ([place])    Schizoaffective disorder, bipolar type ([place])    Seizures ([place])    Last seizure December 2017  Sleep apnea    Past Surgical History:  Procedure Laterality Date   MOUTH SURGERY  2009 or 2010     A IV Location/Drains/Wounds Patient Lines/Drains/Airways Status     Active Line/Drains/Airways     Name Placement date Placement time Site Days   Peripheral IV 07/20/21 20 G Left Antecubital 07/20/21  2105  Antecubital  1            Intake/Output Last 24 hours  Intake/Output Summary (Last 24 hours) at 07/21/2021 1158 Last data filed at 07/21/2021 0806 Gross per 24 hour  Intake 1240 ml  Output --  Net 1240 ml    Labs/Imaging Results for orders placed or performed during the hospital encounter of 07/20/21 (from  the past 48 hour(s))  Comprehensive metabolic panel     Status: Abnormal   Collection Time: 07/20/21  9:30 PM  Result Value Ref Range   Sodium 140 135 - 145 mmol/L   Potassium 3.9 3.5 - 5.1 mmol/L   Chloride 111 98 - 111 mmol/L   CO2 21 (L) 22 - 32 mmol/L   Glucose, Bld 94 70 - 99 mg/dL    Comment: Glucose reference range applies only to samples taken after fasting for at least 8 hours.   BUN 16 6 - 20 mg/dL   Creatinine, Ser 0.61 0.44 - 1.00 mg/dL   Calcium 8.9 8.9 - 10.3 mg/dL   Total Protein 6.3 (L) 6.5 - 8.1 g/dL   Albumin 3.6 3.5 - 5.0 g/dL   AST 17 15 - 41 U/L   ALT 15 0 - 44 U/L   Alkaline Phosphatase 53 38 - 126 U/L   Total Bilirubin 0.5 0.3 - 1.2 mg/dL   GFR, Estimated >60 >60 mL/min    Comment: (NOTE) Calculated using the CKD-EPI Creatinine Equation (2021)    Anion gap 8 5 - 15    Comment: Performed at [facility] [address], [place], [place] [address]  Salicylate level     Status: Abnormal   Collection Time: 07/20/21  9:30 PM  Result Value Ref Range   Salicylate Lvl <8.1 (L) 7.0 - 30.0 mg/dL    Comment: Performed at [facility] [address], [place], [place] [address]  Acetaminophen level     Status: Abnormal   Collection Time: 07/20/21  9:30 PM  Result Value Ref Range   Acetaminophen (Tylenol), Serum <10 (L) 10 - 30 ug/mL    Comment: (NOTE) Therapeutic concentrations vary significantly. A range of 10-30 ug/mL  may be an effective concentration for many patients. However, some  are best treated at concentrations outside of this range. Acetaminophen concentrations >150 ug/mL at 4 hours after ingestion  and >50 ug/mL at 12 hours after ingestion are often associated with  toxic reactions.  Performed at [facility], [place] [address], [place], [place] [address]   Ethanol     Status: None   Collection Time: 07/20/21  9:30 PM  Result Value Ref Range   Alcohol, Ethyl (B) <10 <10 mg/dL    Comment: (NOTE) Lowest detectable limit for serum  alcohol is 10 mg/dL.  For medical purposes only. Performed at [facility], [place] [address] [place], [place] [address]   CBC WITH DIFFERENTIAL     Status: Abnormal   Collection Time: 07/20/21  9:30 PM  Result Value Ref Range   WBC 8.3 4.0 - 10.5 K/uL   RBC 4.09 3.87 - 5.11 MIL/uL   Hemoglobin 10.6 (L)  12.0 - 15.0 g/dL   HCT 33.9 (L) 36.0 - 46.0 %   MCV 82.9 80.0 - 100.0 fL   MCH 25.9 (L) 26.0 - 34.0 pg   MCHC 31.3 30.0 - 36.0 g/dL   RDW 14.4 11.5 - 15.5 %   Platelets 272 150 - 400 K/uL   nRBC 0.0 0.0 - 0.2 %   Neutrophils Relative % 61 %   Neutro Abs 5.0 1.7 - 7.7 K/uL   Lymphocytes Relative 32 %   Lymphs Abs 2.7 0.7 - 4.0 K/uL   Monocytes Relative 6 %   Monocytes Absolute 0.5 0.1 - 1.0 K/uL   Eosinophils Relative 1 %   Eosinophils Absolute 0.1 0.0 - 0.5 K/uL   Basophils Relative 0 %   Basophils Absolute 0.0 0.0 - 0.1 K/uL   Immature Granulocytes 0 %   Abs Immature Granulocytes 0.03 0.00 - 0.07 K/uL    Comment: Performed at [facility] [address] [place], [place] [address]  I-Stat beta hCG blood, ED     Status: None   Collection Time: 07/20/21  9:43 PM  Result Value Ref Range   I-stat hCG, quantitative <5.0 <5 mIU/mL   Comment 3            Comment:   GEST. AGE      CONC.  (mIU/mL)   <=1 WEEK        5 - 50     2 WEEKS       50 - 500     3 WEEKS       100 - 10,000     4 WEEKS     1,000 - 30,000        FEMALE AND NON-PREGNANT FEMALE:     LESS THAN 5 mIU/mL   CBG monitoring, ED     Status: None   Collection Time: 07/20/21  9:59 PM  Result Value Ref Range   Glucose-Capillary 91 70 - 99 mg/dL    Comment: Glucose reference range applies only to samples taken after fasting for at least 8 hours.  Urine rapid drug screen (hosp performed)     Status: None   Collection Time: 07/21/21 12:36 AM  Result Value Ref Range   Opiates NONE DETECTED NONE DETECTED   Cocaine NONE DETECTED NONE DETECTED   Benzodiazepines NONE DETECTED NONE DETECTED   Amphetamines NONE  DETECTED NONE DETECTED   Tetrahydrocannabinol NONE DETECTED NONE DETECTED   Barbiturates NONE DETECTED NONE DETECTED    Comment: (NOTE) DRUG SCREEN FOR MEDICAL PURPOSES ONLY.  IF CONFIRMATION IS NEEDED FOR ANY PURPOSE, NOTIFY LAB WITHIN 5 DAYS.  LOWEST DETECTABLE LIMITS FOR URINE DRUG SCREEN Drug Class                     Cutoff (ng/mL) Amphetamine and metabolites    1000 Barbiturate and metabolites    200 Benzodiazepine                 833 Tricyclics and metabolites     300 Opiates and metabolites        300 Cocaine and metabolites        300 THC                            50 Performed at [facility], [place] [address], [place], [place] [address]   Comprehensive metabolic panel     Status: Abnormal  Collection Time: 07/21/21  4:00 AM  Result Value Ref Range   Sodium 140 135 - 145 mmol/L   Potassium 3.8 3.5 - 5.1 mmol/L   Chloride 112 (H) 98 - 111 mmol/L   CO2 22 22 - 32 mmol/L   Glucose, Bld 106 (H) 70 - 99 mg/dL    Comment: Glucose reference range applies only to samples taken after fasting for at least 8 hours.   BUN 13 6 - 20 mg/dL   Creatinine, Ser 0.61 0.44 - 1.00 mg/dL   Calcium 8.5 (L) 8.9 - 10.3 mg/dL   Total Protein 5.6 (L) 6.5 - 8.1 g/dL   Albumin 3.2 (L) 3.5 - 5.0 g/dL   AST 16 15 - 41 U/L   ALT 12 0 - 44 U/L   Alkaline Phosphatase 50 38 - 126 U/L   Total Bilirubin 0.7 0.3 - 1.2 mg/dL   GFR, Estimated >60 >60 mL/min    Comment: (NOTE) Calculated using the CKD-EPI Creatinine Equation (2021)    Anion gap 6 5 - 15    Comment: Performed at [facility], [place] [address], [place], [place] [address]  CBC     Status: Abnormal   Collection Time: 07/21/21  4:00 AM  Result Value Ref Range   WBC 6.9 4.0 - 10.5 K/uL   RBC 3.77 (L) 3.87 - 5.11 MIL/uL   Hemoglobin 9.8 (L) 12.0 - 15.0 g/dL   HCT 31.7 (L) 36.0 - 46.0 %   MCV 84.1 80.0 - 100.0 fL   MCH 26.0 26.0 - 34.0 pg   MCHC 30.9 30.0 - 36.0 g/dL   RDW 14.6 11.5 - 15.5 %   Platelets 242 150 - 400 K/uL    nRBC 0.0 0.0 - 0.2 %    Comment: Performed at [facility], [place] [address] [place], [place] [address]   No results found.  Pending Labs Unresulted Labs (From admission, onward)     Start     Ordered   07/27/21 0500  Creatinine, serum  (enoxaparin (LOVENOX)    CrCl >/= 30 ml/min)  Weekly,   R     Comments: while on enoxaparin therapy    07/20/21 2316            Vitals/Pain Today's Vitals   07/21/21 0810 07/21/21 0820 07/21/21 0930 07/21/21 1030  BP: 101/62 94/61 112/60 (!) 123/56  Pulse: 86 85 81 88  Resp:   18   Temp:      TempSrc:      SpO2: 99% 98% 98% 99%  PainSc:        Isolation Precautions No active isolations  Medications Medications  albuterol (PROVENTIL) (2.5 MG/3ML) 0.083% nebulizer solution 3 mL (has no administration in time range)  mometasone-formoterol (DULERA) 200-5 MCG/ACT inhaler 2 puff (2 puffs Inhalation Not Given 07/21/21 0903)  enoxaparin (LOVENOX) injection 40 mg (40 mg Subcutaneous Given 07/21/21 0032)  sodium chloride flush (NS) 0.9 % injection 3 mL (3 mLs Intravenous Given 07/21/21 0904)  ibuprofen (ADVIL) tablet 600 mg (has no administration in time range)  charcoal activated (NO SORBITOL) (ACTIDOSE-AQUA) suspension 50 g (50 g Oral Given 07/20/21 2235)  sodium chloride 0.9 % bolus 1,000 mL (0 mLs Intravenous Stopped 07/21/21 0359)    Mobility walks     Focused Assessments Cardiac Assessment Handoff:  Cardiac Rhythm: Normal sinus rhythm Lab Results  Component Value Date   CKTOTAL 200 07/10/2015   TROPONINI <0.03 07/22/2017   Lab Results  Component Value Date  DDIMER <0.27 07/02/2020   Does the Patient currently have chest pain? No    R Recommendations: See Admitting Provider Note  Report given to:   Additional Notes:   IVC patient being admitted after overdose and will be discharged to [facility]

## 2021-07-21 NOTE — Plan of Care (Signed)

## 2021-07-21 NOTE — ED Notes (Signed)
Pt ate lunch, ask pt twice did she wanted shower she stated no she didn't want one, but will attempt 3rd time later today.

## 2021-07-21 NOTE — Progress Notes (Signed)
PROGRESS NOTE  [name] AVW:[id number] DOB: 1990-07-08   PCP: Pcp, No  Patient is from: Home  DOA: 07/20/2021 LOS: 0  Chief complaints Chief Complaint  Patient presents with   Ingestion   Suicide Attempt     Brief Narrative / Interim history: 31 year old F with PMH of anxiety, depression, schizoaffective disorder, adjustment disorder, borderline personality disorder, PTSD, bipolar disorder, prior suicide attempts and overdose, autism, asthma, migraine, seizure and abnormal uterine bleed, admitted due to suicide attempt with overdose.  She states getting into argument with her "ex" and taking the following medications to commit suicide:   Pantoprazole 29x 10MG ,  2900MG  Vraylar 14x 3mg ,  42mg  Lexapro  14x  10mg ,  140mg  Seoquel  14 x 50mg ,  700mg  Gabapentin  44x 300mg ,   13,200mg   In ED, vitals, CMP and CBC stable.  UDS positive for oxazepam.  EtOH level, Tylenol level, salicylate level pregnancy test,, influenza, COVID-19 and EKG without significant finding.  Poison control consulted and recommended observation for about 10 hours to monitor for CNS depression and hypotension.  They also recommended activated charcoal but patient refused.  Patient does IVCD in ED and admitted for observation.  The next day, patient remained stable.  Medically cleared by poison control.  Psychiatry on board.  Subjective: Seen and examined earlier this morning.  No major events overnight of this morning.  He reports frontal headache that she attributes to her migraine.  She also reports anxiety and hearing voices.  She was seen by psychiatry.  Denies chest pain, dyspnea, GI or UTI symptoms.  Objective: Vitals:   07/21/21 1130 07/21/21 1145 07/21/21 1150 07/21/21 1200  BP: 99/72 104/74 102/82 94/68  Pulse: 82 83 88 85  Resp:    20  Temp:      TempSrc:      SpO2: 100% 99% 99% 99%    Examination:  GENERAL: No apparent distress.  Nontoxic. HEENT: MMM.  Vision and hearing grossly  intact.  NECK: Supple.  No apparent JVD.  RESP:  No IWOB.  Fair aeration bilaterally. CVS:  RRR. Heart sounds normal.  ABD/GI/GU: BS+. Abd soft, NTND.  MSK/EXT:  Moves extremities. No apparent deformity. No edema.  SKIN: no apparent skin lesion or wound NEURO: Awake, alert and oriented appropriately.  No apparent focal neuro deficit. PSYCH: Calm. Normal affect.   Procedures:  None  Microbiology summarized: COVID-19 and influenza PCR nonreactive.  Assessment and plan: Principal Problem:   Overdose, intentional self-harm, initial encounter (HCC) Active Problems:   Schizoaffective disorder (HCC)   Adjustment disorder with mixed disturbance of emotions and conduct   Borderline personality disorder (HCC)   PTSD (post-traumatic stress disorder)   Asthma   Migraines   Seizure disorder (HCC)   MDD (major depressive disorder), recurrent episode, severe (HCC)   GAD (generalized anxiety disorder)   Paranoia (HCC)   Bipolar I disorder, current or most recent episode depressed, with psychotic features (HCC)  Suicide attempt with overdose-prior history of the same.  Vitals, labs and EKG stable.  Medically cleared by poison control. -Psychiatry following -Continue one-to-one safety sitter -IVC paperwork completed by EDP  Anxiety/MDD/bipolar disorder/PTSD/adjustment disorder/schizoaffective disorder/autism: Reports feeling anxious and hearing voices. -Psychiatry started gabapentin -Defer further plan to psychiatry  Migraine headache: Reports history of this.  No red flags. -Ibuprofen 600 mg 3 times daily with meals -Resume home Inderal.  Normocytic anemia/history of abnormal uterine bleed: H&H relatively stable. Recent Labs    04/21/21 0100 05/01/21 0204 05/29/21 2348 06/04/21 0157 06/26/21  3244 06/26/21 0847 07/01/21 1106 07/18/21 0920 07/20/21 2130 07/21/21 0400  HGB 12.0 13.9 12.1 12.2 12.9 10.3* 11.2* 11.7* 10.6* 9.8*  -Continue monitoring  Mild intermittent  asthma -Continue home inhalers.  GERD -Hold home PPI for now given PPI overdose  Obesity: BMI 33.27 -Encourage lifestyle change to lose weight   DVT prophylaxis:  enoxaparin (LOVENOX) injection 40 mg Start: 07/20/21 2330  Code Status: Full code Family Communication: None Level of care: Telemetry Cardiac Status is: Observation The patient remains OBS appropriate and will d/c before 2 midnights.   Final disposition: Per psychiatry. Consultants:  Poison control-signed off Psychiatry  Sch Meds:  Scheduled Meds:  enoxaparin (LOVENOX) injection  40 mg Subcutaneous Q24H   gabapentin  300 mg Oral TID   mometasone-formoterol  2 puff Inhalation BID   sodium chloride flush  3 mL Intravenous Q12H   Continuous Infusions: PRN Meds:.albuterol, ibuprofen  Antimicrobials: Anti-infectives (From admission, onward)    None        I have personally reviewed the following labs and images: CBC: Recent Labs  Lab 07/18/21 0920 07/20/21 2130 07/21/21 0400  WBC 8.4 8.3 6.9  NEUTROABS 5.0 5.0  --   HGB 11.7* 10.6* 9.8*  HCT 37.5 33.9* 31.7*  MCV 84.1 82.9 84.1  PLT 239 272 242   BMP &GFR Recent Labs  Lab 07/18/21 0920 07/20/21 2130 07/21/21 0400  NA 138 140 140  K 3.8 3.9 3.8  CL 109 111 112*  CO2 22 21* 22  GLUCOSE 99 94 106*  BUN 15 16 13   CREATININE 0.60 0.61 0.61  CALCIUM 9.2 8.9 8.5*   Estimated Creatinine Clearance: 116.3 mL/min (by C-G formula based on SCr of 0.61 mg/dL). Liver & Pancreas: Recent Labs  Lab 07/18/21 0920 07/20/21 2130 07/21/21 0400  AST 16 17 16   ALT 16 15 12   ALKPHOS 56 53 50  BILITOT 0.3 0.5 0.7  PROT 6.8 6.3* 5.6*  ALBUMIN 3.9 3.6 3.2*   No results for input(s): "LIPASE", "AMYLASE" in the last 168 hours. No results for input(s): "AMMONIA" in the last 168 hours. Diabetic: No results for input(s): "HGBA1C" in the last 72 hours. Recent Labs  Lab 07/20/21 2159  GLUCAP 91   Cardiac Enzymes: No results for input(s): "CKTOTAL",  "CKMB", "CKMBINDEX", "TROPONINI" in the last 168 hours. No results for input(s): "PROBNP" in the last 8760 hours. Coagulation Profile: No results for input(s): "INR", "PROTIME" in the last 168 hours. Thyroid Function Tests: No results for input(s): "TSH", "T4TOTAL", "FREET4", "T3FREE", "THYROIDAB" in the last 72 hours. Lipid Profile: No results for input(s): "CHOL", "HDL", "LDLCALC", "TRIG", "CHOLHDL", "LDLDIRECT" in the last 72 hours. Anemia Panel: No results for input(s): "VITAMINB12", "FOLATE", "FERRITIN", "TIBC", "IRON", "RETICCTPCT" in the last 72 hours. Urine analysis:    Component Value Date/Time   COLORURINE STRAW (A) 05/29/2021 2348   APPEARANCEUR CLEAR 05/29/2021 2348   LABSPEC 1.010 05/29/2021 2348   PHURINE 6.0 05/29/2021 2348   GLUCOSEU NEGATIVE 05/29/2021 2348   HGBUR NEGATIVE 05/29/2021 2348   BILIRUBINUR NEGATIVE 05/29/2021 2348   BILIRUBINUR negative 06/14/2015 0930   KETONESUR NEGATIVE 05/29/2021 2348   PROTEINUR NEGATIVE 05/29/2021 2348   UROBILINOGEN 0.2 10/24/2015 1428   NITRITE NEGATIVE 05/29/2021 2348   LEUKOCYTESUR NEGATIVE 05/29/2021 2348   Sepsis Labs: Invalid input(s): "PROCALCITONIN", "LACTICIDVEN"  Microbiology: Recent Results (from the past 240 hour(s))  Resp Panel by RT-PCR (Flu A&B, Covid) Anterior Nasal Swab     Status: None   Collection Time: 07/12/21  9:20 PM  Specimen: Anterior Nasal Swab  Result Value Ref Range Status   SARS Coronavirus 2 by RT PCR NEGATIVE NEGATIVE Final    Comment: (NOTE) SARS-CoV-2 target nucleic acids are NOT DETECTED.  The SARS-CoV-2 RNA is generally detectable in upper respiratory specimens during the acute phase of infection. The lowest concentration of SARS-CoV-2 viral copies this assay can detect is 138 copies/mL. A negative result does not preclude SARS-Cov-2 infection and should not be used as the sole basis for treatment or other patient management decisions. A negative result may occur with  improper  specimen collection/handling, submission of specimen other than nasopharyngeal swab, presence of viral mutation(s) within the areas targeted by this assay, and inadequate number of viral copies(<138 copies/mL). A negative result must be combined with clinical observations, patient history, and epidemiological information. The expected result is Negative.  Fact Sheet for Patients:  BloggerCourse.com  Fact Sheet for Healthcare Providers:  SeriousBroker.it  This test is no t yet approved or cleared by the [place] FDA and  has been authorized for detection and/or diagnosis of SARS-CoV-2 by FDA under an Emergency Use Authorization (EUA). This EUA will remain  in effect (meaning this test can be used) for the duration of the COVID-19 declaration under Section 564(b)(1) of the Act, 21 U.S.C.section 360bbb-3(b)(1), unless the authorization is terminated  or revoked sooner.       Influenza A by PCR NEGATIVE NEGATIVE Final   Influenza B by PCR NEGATIVE NEGATIVE Final    Comment: (NOTE) The Xpert Xpress SARS-CoV-2/FLU/RSV plus assay is intended as an aid in the diagnosis of influenza from Nasopharyngeal swab specimens and should not be used as a sole basis for treatment. Nasal washings and aspirates are unacceptable for Xpert Xpress SARS-CoV-2/FLU/RSV testing.  Fact Sheet for Patients: BloggerCourse.com  Fact Sheet for Healthcare Providers: SeriousBroker.it  This test is not yet approved or cleared by the [place] FDA and has been authorized for detection and/or diagnosis of SARS-CoV-2 by FDA under an Emergency Use Authorization (EUA). This EUA will remain in effect (meaning this test can be used) for the duration of the COVID-19 declaration under Section 564(b)(1) of the Act, 21 U.S.C. section 360bbb-3(b)(1), unless the authorization is terminated or revoked.  Performed at  [facility], [address] [place], [place] [address]   Resp Panel by RT-PCR (Flu A&B, Covid) Anterior Nasal Swab     Status: None   Collection Time: 07/15/21 10:10 PM   Specimen: Anterior Nasal Swab  Result Value Ref Range Status   SARS Coronavirus 2 by RT PCR NEGATIVE NEGATIVE Final    Comment: (NOTE) SARS-CoV-2 target nucleic acids are NOT DETECTED.  The SARS-CoV-2 RNA is generally detectable in upper respiratory specimens during the acute phase of infection. The lowest concentration of SARS-CoV-2 viral copies this assay can detect is 138 copies/mL. A negative result does not preclude SARS-Cov-2 infection and should not be used as the sole basis for treatment or other patient management decisions. A negative result may occur with  improper specimen collection/handling, submission of specimen other than nasopharyngeal swab, presence of viral mutation(s) within the areas targeted by this assay, and inadequate number of viral copies(<138 copies/mL). A negative result must be combined with clinical observations, patient history, and epidemiological information. The expected result is Negative.  Fact Sheet for Patients:  BloggerCourse.com  Fact Sheet for Healthcare Providers:  SeriousBroker.it  This test is no t yet approved or cleared by the [place] and  has been authorized for  detection and/or diagnosis of SARS-CoV-2 by FDA under an Emergency Use Authorization (EUA). This EUA will remain  in effect (meaning this test can be used) for the duration of the COVID-19 declaration under Section 564(b)(1) of the Act, 21 U.S.C.section 360bbb-3(b)(1), unless the authorization is terminated  or revoked sooner.       Influenza A by PCR NEGATIVE NEGATIVE Final   Influenza B by PCR NEGATIVE NEGATIVE Final    Comment: (NOTE) The Xpert Xpress SARS-CoV-2/FLU/RSV plus assay is intended as an aid in the diagnosis of  influenza from Nasopharyngeal swab specimens and should not be used as a sole basis for treatment. Nasal washings and aspirates are unacceptable for Xpert Xpress SARS-CoV-2/FLU/RSV testing.  Fact Sheet for Patients: BloggerCourse.com  Fact Sheet for Healthcare Providers: SeriousBroker.it  This test is not yet approved or cleared by the [place] FDA and has been authorized for detection and/or diagnosis of SARS-CoV-2 by FDA under an Emergency Use Authorization (EUA). This EUA will remain in effect (meaning this test can be used) for the duration of the COVID-19 declaration under Section 564(b)(1) of the Act, 21 U.S.C. section 360bbb-3(b)(1), unless the authorization is terminated or revoked.  Performed at [facility], [address], [place], [place] [address]   Resp Panel by RT-PCR (Flu A&B, Covid) Anterior Nasal Swab     Status: None   Collection Time: 07/18/21  9:11 AM   Specimen: Anterior Nasal Swab  Result Value Ref Range Status   SARS Coronavirus 2 by RT PCR NEGATIVE NEGATIVE Final    Comment: (NOTE) SARS-CoV-2 target nucleic acids are NOT DETECTED.  The SARS-CoV-2 RNA is generally detectable in upper respiratory specimens during the acute phase of infection. The lowest concentration of SARS-CoV-2 viral copies this assay can detect is 138 copies/mL. A negative result does not preclude SARS-Cov-2 infection and should not be used as the sole basis for treatment or other patient management decisions. A negative result may occur with  improper specimen collection/handling, submission of specimen other than nasopharyngeal swab, presence of viral mutation(s) within the areas targeted by this assay, and inadequate number of viral copies(<138 copies/mL). A negative result must be combined with clinical observations, patient history, and epidemiological information. The expected result is Negative.  Fact Sheet for  Patients:  BloggerCourse.com  Fact Sheet for Healthcare Providers:  SeriousBroker.it  This test is no t yet approved or cleared by the [place] FDA and  has been authorized for detection and/or diagnosis of SARS-CoV-2 by FDA under an Emergency Use Authorization (EUA). This EUA will remain  in effect (meaning this test can be used) for the duration of the COVID-19 declaration under Section 564(b)(1) of the Act, 21 U.S.C.section 360bbb-3(b)(1), unless the authorization is terminated  or revoked sooner.       Influenza A by PCR NEGATIVE NEGATIVE Final   Influenza B by PCR NEGATIVE NEGATIVE Final    Comment: (NOTE) The Xpert Xpress SARS-CoV-2/FLU/RSV plus assay is intended as an aid in the diagnosis of influenza from Nasopharyngeal swab specimens and should not be used as a sole basis for treatment. Nasal washings and aspirates are unacceptable for Xpert Xpress SARS-CoV-2/FLU/RSV testing.  Fact Sheet for Patients: BloggerCourse.com  Fact Sheet for Healthcare Providers: SeriousBroker.it  This test is not yet approved or cleared by the [place] FDA and has been authorized for detection and/or diagnosis of SARS-CoV-2 by FDA under an Emergency Use Authorization (EUA). This EUA will remain in effect (meaning this test can be used)  for the duration of the COVID-19 declaration under Section 564(b)(1) of the Act, 21 U.S.C. section 360bbb-3(b)(1), unless the authorization is terminated or revoked.  Performed at [facility], [address] [place], [place] [address]   Resp Panel by RT-PCR (Flu A&B, Covid) Anterior Nasal Swab     Status: None   Collection Time: 07/19/21 11:58 PM   Specimen: Anterior Nasal Swab  Result Value Ref Range Status   SARS Coronavirus 2 by RT PCR NEGATIVE NEGATIVE Final    Comment: (NOTE) SARS-CoV-2 target nucleic acids are NOT DETECTED.  The  SARS-CoV-2 RNA is generally detectable in upper respiratory specimens during the acute phase of infection. The lowest concentration of SARS-CoV-2 viral copies this assay can detect is 138 copies/mL. A negative result does not preclude SARS-Cov-2 infection and should not be used as the sole basis for treatment or other patient management decisions. A negative result may occur with  improper specimen collection/handling, submission of specimen other than nasopharyngeal swab, presence of viral mutation(s) within the areas targeted by this assay, and inadequate number of viral copies(<138 copies/mL). A negative result must be combined with clinical observations, patient history, and epidemiological information. The expected result is Negative.  Fact Sheet for Patients:  BloggerCourse.com  Fact Sheet for Healthcare Providers:  SeriousBroker.it  This test is no t yet approved or cleared by the [place] FDA and  has been authorized for detection and/or diagnosis of SARS-CoV-2 by FDA under an Emergency Use Authorization (EUA). This EUA will remain  in effect (meaning this test can be used) for the duration of the COVID-19 declaration under Section 564(b)(1) of the Act, 21 U.S.C.section 360bbb-3(b)(1), unless the authorization is terminated  or revoked sooner.       Influenza A by PCR NEGATIVE NEGATIVE Final   Influenza B by PCR NEGATIVE NEGATIVE Final    Comment: (NOTE) The Xpert Xpress SARS-CoV-2/FLU/RSV plus assay is intended as an aid in the diagnosis of influenza from Nasopharyngeal swab specimens and should not be used as a sole basis for treatment. Nasal washings and aspirates are unacceptable for Xpert Xpress SARS-CoV-2/FLU/RSV testing.  Fact Sheet for Patients: BloggerCourse.com  Fact Sheet for Healthcare Providers: SeriousBroker.it  This test is not yet approved or  cleared by the [place] FDA and has been authorized for detection and/or diagnosis of SARS-CoV-2 by FDA under an Emergency Use Authorization (EUA). This EUA will remain in effect (meaning this test can be used) for the duration of the COVID-19 declaration under Section 564(b)(1) of the Act, 21 U.S.C. section 360bbb-3(b)(1), unless the authorization is terminated or revoked.  Performed at [facility], [address], [place], [place] [address]     Radiology Studies: No results found.    [name] T. Karinna Beadles [facility]  If 7PM-7AM, please contact night-coverage [email or web address] 07/21/2021, 1:26 PM

## 2021-07-21 NOTE — ED Notes (Signed)
IVC paperwork is complete at the orange desk.

## 2021-07-21 NOTE — ED Notes (Signed)
Poison control reached out to this RN regarding patient status, per poison control they recommend a repeat EKG and Tylenol level. MD Trilby Drummer made aware

## 2021-07-21 NOTE — ED Notes (Signed)
Patient provided with [place] sandwich, crackers, and apple juice

## 2021-07-21 NOTE — Progress Notes (Signed)
Received patient from ED via wheelchair.  Patient is alert and oriented x 4, safety sitter with patient.  Noted with some abrasions and scratch marks on bilateral arms and neck.  Oriented to room and unit routine.  Patient's belongings per report have been locked up, patient is aware of this.  Safety precautions reviewed and checked with safety sitter.  Needs addressed.

## 2021-07-21 NOTE — Progress Notes (Signed)
Safety checks done at beginning of shift. Cables removed from room and secured outside, drapes tied, and all electrical outlets taped. Patient is not happy and experiencing psychological crises.

## 2021-07-21 NOTE — ED Notes (Signed)
Per poison control- pt is clear as long as pt is no longer nauseous

## 2021-07-21 NOTE — ED Notes (Addendum)
Patient dressed out into purple scrubs. Personal belongings in locker #12. Valuables given to security.

## 2021-07-21 NOTE — ED Notes (Signed)
ED Provider at bedside.Pt was given crossword puzzle to work on, pt. Wanted something to calm down, I told nurse and nurse had message doctor,

## 2021-07-21 NOTE — Consult Note (Signed)
[facility] New Face-to-Face Psychiatric Evaluation   Service Date: July 21, 2021 LOS:  LOS: 0 days    Assessment  [name] is a 31 y.o. female admitted medically for 07/20/2021  8:56 PM for a suicide attempt by overdose. She carries the psychiatric diagnoses of borderline personality disorder, schizoaffective disorder (vs bipolar disorder), PTSD autism and recurrent self-harm. She  has a past medical history of fragile X carrier, acid reflux, asthma, constipation, GERD, essential tremor, ?seizures. It should be noted that despite documentation of autism and ?IDD patient has historically been capable of meaningfully participating in group programming on inpatient psychiatric units. Psychiatry was consulted for overdose by Neva Seat, MD.    Her current presentation of overdose with suicidal intent is most consistent with depressive phase of known mental illness. Does appear to be less impulsive than prior attempts as evidenced by pt presented to various Eds at least 2x in 48 hours prior to attempt (leaving AMA or contracting for safety). She remains ambivalent about suicide attempt and has minimal insight into the severity of her behavior - fixated on admission to a particular psychiatric hospital [facility]) throughout admission. She reports good response to current psychotropic regimen (although has had numerous recent suicide attempts on said regimen); it is biologically implausible for the voices to return immediately if she is a few minutes late on a dose. Regardless, she requires inpatient psychiatric hospitalization when medically stable and should be faxed to outside facilities if [facility] does not have an appropriate bed.   Diagnoses:  Active Hospital problems: Principal Problem:   Overdose, intentional self-harm, initial encounter [facility]) Active Problems:   Schizoaffective disorder ([place])   Borderline personality disorder ([place])   PTSD (post-traumatic stress  disorder)   Asthma   Migraines   Seizure disorder ([place])   Adjustment disorder with mixed disturbance of emotions and conduct   MDD (major depressive disorder), recurrent episode, severe ([place])   GAD (generalized anxiety disorder)   Paranoia ([place])   Bipolar I disorder, current or most recent episode depressed, with psychotic features ([place])     Plan  ## Safety and Observation Level:  - Based on my clinical evaluation, I estimate the patient to be at high risk of self harm in the current setting - At this time, we recommend a 1:1 level of observation. This decision is based on my review of the chart including patient's history and current presentation, interview of the patient, mental status examination, and consideration of suicide risk including evaluating suicidal ideation, plan, intent, suicidal or self-harm behaviors, risk factors, and protective factors. This judgment is based on our ability to directly address suicide risk, implement suicide prevention strategies and develop a safety plan while the patient is in the clinical setting. Please contact our team if there is a concern that risk level has changed.   ## Medications:  -- restart gabapentin 300 TID (no evidence AKI) -- OK to restart propranalol if pt becomes hypertensive/tachy (last BP 98/57) -- Likely supratherapeutic on other meds including vraylar, lexapro (longer half lives)- would not restart for 1-2 more days  ## Medical Decision Making Capacity:  Not formally assessed  ## Further Work-up:  -- per poison control  -- will likely need repeat A1c when on psych unit (last value in Jan)  -- most recent EKG on 6/16 had QtC of <500 -- Pertinent labwork reviewed earlier this admission includes: tsh wnl 05/01/21, a1c wnl 03/01/21, lipid profile with elevated cholesterol/LDL/triglycerides in 05/01/21  ## Disposition:  --  inpt psych   Thank you for this consult request. Recommendations have been communicated to the primary  team.  We will continue to follow at this time.   [place] A Braylin Xu   New history  Relevant Aspects of Hospital Course:  Admitted on 07/20/2021 for suicidal overdose. Had had several recent ED presentations for ideations - brief .  - presented to ED on 6/13 after trying to cut her throat with a knife - pt presented to [facility] on 6/14 with SI and recently cut neck - contracted for safety  - seems to have presented again to ED shortly after dc from [facility] - left AMA. At some point in all this cited ex coming into town as a safety factor - presented to ED late PM of 6/15 after taking all of her medications (argument with ex) - found by EMS surrounded by emesis with some pills PANTOPRAZOLE 29 X '10MG'$   '2900MG'$  Vraylar 14x '3mg'$   '42mg'$  Lexapro  14x  '10mg'$   '140mg'$  Seoquel  14 x '50mg'$   '700mg'$  Gabapentin  44x '300mg'$    13,'200mg'$  - similar presentation 6/7 however refused admission to [facility] bc of ?having to pay for an uber and was voluntary so discharged  Patient Report:  Patient seen while still in ED. States that she heard voices telling her to kill herself and acted on these. Unable to identify a specific trigger for SA - denies she got in a fight with her ex as documented elsewhere. She does note that if she doesn't take her pills at the same time every day her hallucinations return.   Tried some coping mechanisms she learned at [facility] with minimal results. Through conversation brings up desire to go to [facility] numerous times - feels most helpful hospital she has been to.   Despite numerous recent suicide attempts, states her current mediation regimen "helps me a lot" and feels it is the best regimen she has been on - in particular feels gabapentin most helpful.   Her main support is her ex. 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 Notes that her previous most stable  period was during a 2 week stint of employment and feels getting a job would help her sense of self-wroth and mental health. She is afraid that she will kill herself before she finds a job and is open to psych hospitalization at this time.   Outside the hospital sleep is varied. Appetite is increased but trying to diet.   Gives permission to call her CST team. Her main therapist Aldona Bar) is on vacation this week and she has felt less comfortable calling than usual.   ROS:  Nausea. No diarrhea, vomiting since OD.   Collateral information:  Tawanna Solo with CST team, no one picked up.   Psychiatric History:  Information collected from pt, medical record.  "intellectual disability, autism, bipolar disorder, depression, anxiety, and borderline personality disorder  Numerous SA and many lifetime hospitalizations. Has been on most psychotropic medications.    Social History:  Living with ex husband   Tobacco use: denies Alcohol use: rare Drug use: denies  Family History:  The patient's family history includes Asthma in her father; Mental illness in her father; PDD in her brother; Seizures in her brother.  Medical History: Past Medical History:  Diagnosis Date   Acid reflux    Anxiety  Asthma    last attack 03/13/15 or 03/14/15   Autism    Carrier of fragile X syndrome    Chronic constipation    Depression    Drug-seeking behavior    Essential tremor    Headache    Overdose of acetaminophen 07/2017   and other meds   Personality disorder ([place])    Schizo-affective psychosis ([place])    Schizoaffective disorder, bipolar type ([place])    Seizures ([place])    Last seizure December 2017   Sleep apnea     Surgical History: Past Surgical History:  Procedure Laterality Date   MOUTH SURGERY  2009 or 2010    Medications:   Current Facility-Administered Medications:    albuterol (PROVENTIL) (2.5 MG/3ML) 0.083% nebulizer solution 3 mL, 3 mL, Inhalation, Q6H PRN, Marcelyn Bruins, MD   diclofenac Sodium (VOLTAREN) 1 % topical gel 2 g, 2 g, Topical, QID, Gonfa, Taye T, MD   enoxaparin (LOVENOX) injection 40 mg, 40 mg, Subcutaneous, Q24H, Marcelyn Bruins, MD, 40 mg at 07/21/21 0032   gabapentin (NEURONTIN) capsule 300 mg, 300 mg, Oral, TID, Mohammad Granade A, 300 mg at 07/21/21 1538   ibuprofen (ADVIL) tablet 600 mg, 600 mg, Oral, TID WC PRN, Cyndia Skeeters, Taye T, MD, 600 mg at 07/21/21 1232   melatonin tablet 3 mg, 3 mg, Oral, QHS, Gonfa, Taye T, MD   mometasone-formoterol (DULERA) 200-5 MCG/ACT inhaler 2 puff, 2 puff, Inhalation, BID, Marcelyn Bruins, MD   propranolol (INDERAL) tablet 10 mg, 10 mg, Oral, BID, Gonfa, Taye T, MD   sodium chloride flush (NS) 0.9 % injection 3 mL, 3 mL, Intravenous, Q12H, Marcelyn Bruins, MD, 3 mL at 07/21/21 0904  Allergies: Allergies  Allergen Reactions   Bee Venom Anaphylaxis   Coconut Flavor Anaphylaxis and Rash   Geodon [Ziprasidone Hcl] Other (See Comments)    Pt states that this medication causes paralysis of the mouth.     Haloperidol And Related Other (See Comments)    Pt states that this medication causes paralysis of the mouth, jaw locks up   Lithobid [Lithium] Other (See Comments)    Seizure-like activity    Other Anaphylaxis and Other (See Comments)    Coconut (fruit) - Anaphylaxis (Pt allergic to both coconut flavor and fruit)   Roxicodone [Oxycodone] Other (See Comments)    Hallucinations    Seroquel [Quetiapine] Other (See Comments)    Severe drowsiness Pt currently taking 50 mg, she is ok with this strength, she does not want higher dose.   Shellfish Allergy Anaphylaxis   Phenergan [Promethazine Hcl] Other (See Comments)    Chest pain     Prilosec [Omeprazole] Nausea And Vomiting   Sulfa Antibiotics Other (See Comments)    Chest pain    Tegretol [Carbamazepine] Nausea And Vomiting   Prozac [Fluoxetine] Other (See Comments)    Increased Depression Suicidal thoughts   Tape Other (See Comments)     Skin tears, can only tolerate paper tape.   Tylenol [Acetaminophen] Other (See Comments)    Unknown reaction       Objective  Vital signs:  Temp:  [97 F (36.1 C)-98.3 F (36.8 C)] 98.3 F (36.8 C) (06/16 1555) Pulse Rate:  [72-94] 77 (06/16 1555) Resp:  [12-21] 18 (06/16 1555) BP: (92-123)/(56-82) 100/59 (06/16 1555) SpO2:  [94 %-100 %] 100 % (06/16 1555) Weight:  [92.6 kg] 92.6 kg (06/16 1431)  Psychiatric Specialty Exam:  Presentation  General Appearance: Disheveled (multiple bruises/superficial lacerations)  Eye  Contact:Fair  Speech:Clear and Coherent (Rapid, not pressured)  Speech Volume:Normal  Handedness:Ambidextrous   Mood and Affect  Mood:Depressed; Anxious (frustrated, hopeless)  Affect:Congruent (appropriate to topic of conversation)   Thought Process  Thought Processes:Goal Directed (goal is admission @ [facility])  Descriptions of Associations:Loose  Orientation:Full (Time, Place and Person)  Thought Content:WDL  History of Schizophrenia/Schizoaffective disorder:Yes  Duration of Psychotic Symptoms:Greater than six months  Hallucinations:Hallucinations: Auditory; Command Description of Command Hallucinations: telling her to kill herself  Ideas of Reference:None  Suicidal Thoughts:Suicidal Thoughts: Yes, Passive  Homicidal Thoughts:Homicidal Thoughts: No   Sensorium  Memory:Immediate Good; Recent Good; Remote Good  Judgment:Poor  Insight:-- (superficial)   Executive Functions  Concentration:Fair  Attention Span:Fair  [place]   Psychomotor Activity  Psychomotor Activity:Psychomotor Activity: Normal  Assets  Assets:Communication Skills; Desire for Improvement   Sleep  Sleep:No data recorded   Physical Exam: Physical Exam HENT:     Head: Normocephalic.     Comments: Short hair Pulmonary:     Effort: Pulmonary effort is normal.  Neurological:     Mental Status: She is  alert.     Comments: 1 beat clonus in LLE, none in other extremities   Psychiatric:     Comments: Impaired insight and judgment     Blood pressure (!) 100/59, pulse 77, temperature 98.3 F (36.8 C), temperature source Oral, resp. rate 18, height '5\' 4"'$  (1.626 m), weight 92.6 kg, last menstrual period 06/20/2021, SpO2 100 %. Body mass index is 35.04 kg/m.

## 2021-07-21 NOTE — ED Notes (Signed)
Pt. Ate breakfast, pt is calm and cooperative

## 2021-07-22 DIAGNOSIS — J4520 Mild intermittent asthma, uncomplicated: Secondary | ICD-10-CM | POA: Diagnosis not present

## 2021-07-22 DIAGNOSIS — G40909 Epilepsy, unspecified, not intractable, without status epilepticus: Secondary | ICD-10-CM | POA: Diagnosis not present

## 2021-07-22 DIAGNOSIS — F251 Schizoaffective disorder, depressive type: Secondary | ICD-10-CM | POA: Diagnosis not present

## 2021-07-22 DIAGNOSIS — F315 Bipolar disorder, current episode depressed, severe, with psychotic features: Secondary | ICD-10-CM | POA: Diagnosis not present

## 2021-07-22 DIAGNOSIS — F22 Delusional disorders: Secondary | ICD-10-CM | POA: Diagnosis not present

## 2021-07-22 DIAGNOSIS — T50902A Poisoning by unspecified drugs, medicaments and biological substances, intentional self-harm, initial encounter: Secondary | ICD-10-CM | POA: Diagnosis not present

## 2021-07-22 DIAGNOSIS — G43009 Migraine without aura, not intractable, without status migrainosus: Secondary | ICD-10-CM | POA: Diagnosis not present

## 2021-07-22 DIAGNOSIS — F333 Major depressive disorder, recurrent, severe with psychotic symptoms: Secondary | ICD-10-CM | POA: Diagnosis not present

## 2021-07-22 DIAGNOSIS — F4310 Post-traumatic stress disorder, unspecified: Secondary | ICD-10-CM | POA: Diagnosis not present

## 2021-07-22 DIAGNOSIS — F603 Borderline personality disorder: Secondary | ICD-10-CM | POA: Diagnosis not present

## 2021-07-22 DIAGNOSIS — F4325 Adjustment disorder with mixed disturbance of emotions and conduct: Secondary | ICD-10-CM | POA: Diagnosis not present

## 2021-07-22 DIAGNOSIS — D649 Anemia, unspecified: Secondary | ICD-10-CM | POA: Diagnosis not present

## 2021-07-22 MED ORDER — QUETIAPINE FUMARATE 25 MG PO TABS
50.0000 mg | ORAL_TABLET | Freq: Once | ORAL | Status: AC
  Administered 2021-07-22: 50 mg via ORAL
  Filled 2021-07-22: qty 2

## 2021-07-22 MED ORDER — PANTOPRAZOLE SODIUM 40 MG PO TBEC
40.0000 mg | DELAYED_RELEASE_TABLET | Freq: Every day | ORAL | Status: DC
  Administered 2021-07-22 – 2021-07-24 (×3): 40 mg via ORAL
  Filled 2021-07-22 (×3): qty 1

## 2021-07-22 MED ORDER — ADULT MULTIVITAMIN W/MINERALS CH
1.0000 | ORAL_TABLET | Freq: Every day | ORAL | Status: DC
  Administered 2021-07-22 – 2021-07-24 (×3): 1 via ORAL
  Filled 2021-07-22 (×3): qty 1

## 2021-07-22 NOTE — Progress Notes (Signed)
TRH floor coverage for both [facility] and [facility] (remote) on night of 07/21/21 into morning of 07/22/21:    I was notified by RN that the patient appears anxious, and is also reporting visual and auditory hallucinations.  Per my chart review, patient appears to have history of type I bipolar disorder, paranoia.  She was formally evaluated by psychiatry earlier today.  Per my review of associated psychiatry consultation note, I did not see any specific recommendations for prn antidopaminergic agents.   Given what appears to be typical psychotic features, I initially consider initiation of prn IV Haldol, but [place] found that the patient has a documented history of allergies to both Haldol as well as Geodon.  I subsequently placed order for prn IV Ativan to assist with the anxiety component of the above.  However, RN informed me that the patient does not currently have IV access, and that the patient is refusing establishment of IV access.  I then changed the order for prn IV Ativan to prn p.o. Ativan.     Babs Bertin, DO Hospitalist

## 2021-07-22 NOTE — Progress Notes (Signed)
Referrals made to area inpatient psych facilities per psych recommendation. Will provide updates as available.   Wandra Feinstein, MSW, LCSW [phone] (coverage)

## 2021-07-22 NOTE — Care Management Obs Status (Signed)
[place] NOTIFICATION   Patient Details  Name: [name] MRN: [id number] Date of Birth: [birth year]   Medicare Observation Status Notification Given:  Yes    Carles Collet, RN 07/22/2021, 9:13 AM

## 2021-07-22 NOTE — Progress Notes (Signed)
PROGRESS NOTE  [name] ZOX:[id number] DOB: 04/13/1990   PCP: Pcp, No  Patient is from: Home  DOA: 07/20/2021 LOS: 0  Chief complaints Chief Complaint  Patient presents with   Ingestion   Suicide Attempt     Brief Narrative / Interim history: 31 year old F with PMH of anxiety, depression, schizoaffective disorder, adjustment disorder, borderline personality disorder, PTSD, bipolar disorder, prior suicide attempts and overdose, autism, asthma, migraine, seizure and abnormal uterine bleed, admitted due to suicide attempt with overdose.  She states getting into argument with her "ex" and taking the following medications to commit suicide:   Pantoprazole 29x 10MG ,  2900MG  Vraylar 14x 3mg ,  42mg  Lexapro  14x  10mg ,  140mg  Seoquel  14 x 50mg ,  700mg  Gabapentin  44x 300mg ,   13,200mg   In ED, vitals, CMP and CBC stable.  UDS positive for oxazepam.  EtOH level, Tylenol level, salicylate level pregnancy test,, influenza, COVID-19 and EKG without significant finding.  Poison control consulted and recommended observation for about 10 hours to monitor for CNS depression and hypotension.  They also recommended activated charcoal but patient refused.  Patient does IVCD in ED and admitted for observation.  The next day, patient remained stable.  Medically cleared by poison control.  Psychiatry recommended inpatient psych.  Subjective: Seen and examined earlier this morning.  No major events overnight of this morning.  She is asking for her multivitamin and acid reflux medication.  No other complaints  Objective: Vitals:   07/21/21 2111 07/22/21 0412 07/22/21 0851 07/22/21 1026  BP: (!) 107/56 (!) 102/47 (!) 113/49 (!) 114/55  Pulse: 90 70 91 80  Resp: 18 16 18 15   Temp: 98.3 F (36.8 C) 98.1 F (36.7 C) 98.2 F (36.8 C) 98.2 F (36.8 C)  TempSrc: Oral Oral Oral Oral  SpO2: 97% 100% 99% 99%  Weight:      Height:        Examination:  GENERAL: No apparent distress.   Nontoxic. HEENT: MMM.  Vision and hearing grossly intact.  NECK: Supple.  No apparent JVD.  RESP:  No IWOB.  Fair aeration bilaterally. CVS:  RRR. Heart sounds normal.  ABD/GI/GU: BS+. Abd soft, NTND.  MSK/EXT:  Moves extremities. No apparent deformity. No edema.  SKIN: no apparent skin lesion or wound NEURO: Awake and alert. Oriented appropriately.  No apparent focal neuro deficit. PSYCH: Calm. Normal affect.   Procedures:  None  Microbiology summarized: COVID-19 and influenza PCR nonreactive.  Assessment and plan: Principal Problem:   Overdose, intentional self-harm, initial encounter (HCC) Active Problems:   Schizoaffective disorder (HCC)   Adjustment disorder with mixed disturbance of emotions and conduct   Borderline personality disorder (HCC)   PTSD (post-traumatic stress disorder)   Asthma   Migraines   Seizure disorder (HCC)   MDD (major depressive disorder), recurrent episode, severe (HCC)   GAD (generalized anxiety disorder)   Paranoia (HCC)   Bipolar I disorder, current or most recent episode depressed, with psychotic features (HCC)  Suicide attempt with overdose-prior history of the same.  Vitals, labs and EKG stable.  Medically cleared by poison control. -Psychiatry following -Continue one-to-one safety sitter -IVC paperwork completed by EDP  Anxiety/MDD/bipolar disorder/PTSD/adjustment disorder/schizoaffective disorder/autism: Reports feeling anxious and hearing voices. -Psychiatry started gabapentin -Defer further plan to psychiatry  Migraine headache: Reports history of this.  No red flags. -Ibuprofen 600 mg 3 times daily with meals -Resume home Inderal.  Normocytic anemia/history of abnormal uterine bleed: H&H relatively stable. Recent Labs  04/21/21 0100 05/01/21 0204 05/29/21 2348 06/04/21 0157 06/26/21 0226 06/26/21 0847 07/01/21 1106 07/18/21 0920 07/20/21 2130 07/21/21 0400  HGB 12.0 13.9 12.1 12.2 12.9 10.3* 11.2* 11.7* 10.6* 9.8*   -Continue monitoring  Mild intermittent asthma -Continue home inhalers.  GERD -Resume PPI.  Obesity: BMI 33.27 -Encourage lifestyle change to lose weight   DVT prophylaxis:  enoxaparin (LOVENOX) injection 40 mg Start: 07/20/21 2330  Code Status: Full code Family Communication: None Level of care: Med-Surg Status is: Observation The patient remains OBS appropriate and will d/c before 2 midnights.   Final disposition: Per psychiatry. Consultants:  Poison control-signed off Psychiatry  Sch Meds:  Scheduled Meds:  diclofenac Sodium  2 g Topical QID   enoxaparin (LOVENOX) injection  40 mg Subcutaneous Q24H   gabapentin  300 mg Oral TID   melatonin  3 mg Oral QHS   mometasone-formoterol  2 puff Inhalation BID   propranolol  10 mg Oral BID   sodium chloride flush  3 mL Intravenous Q12H   Continuous Infusions: PRN Meds:.albuterol, ibuprofen, LORazepam  Antimicrobials: Anti-infectives (From admission, onward)    None        I have personally reviewed the following labs and images: CBC: Recent Labs  Lab 07/18/21 0920 07/20/21 2130 07/21/21 0400  WBC 8.4 8.3 6.9  NEUTROABS 5.0 5.0  --   HGB 11.7* 10.6* 9.8*  HCT 37.5 33.9* 31.7*  MCV 84.1 82.9 84.1  PLT 239 272 242   BMP &GFR Recent Labs  Lab 07/18/21 0920 07/20/21 2130 07/21/21 0400  NA 138 140 140  K 3.8 3.9 3.8  CL 109 111 112*  CO2 22 21* 22  GLUCOSE 99 94 106*  BUN 15 16 13   CREATININE 0.60 0.61 0.61  CALCIUM 9.2 8.9 8.5*   Estimated Creatinine Clearance: 111.4 mL/min (by C-G formula based on SCr of 0.61 mg/dL). Liver & Pancreas: Recent Labs  Lab 07/18/21 0920 07/20/21 2130 07/21/21 0400  AST 16 17 16   ALT 16 15 12   ALKPHOS 56 53 50  BILITOT 0.3 0.5 0.7  PROT 6.8 6.3* 5.6*  ALBUMIN 3.9 3.6 3.2*   No results for input(s): "LIPASE", "AMYLASE" in the last 168 hours. No results for input(s): "AMMONIA" in the last 168 hours. Diabetic: No results for input(s): "HGBA1C" in the last  72 hours. Recent Labs  Lab 07/20/21 2159  GLUCAP 91   Cardiac Enzymes: No results for input(s): "CKTOTAL", "CKMB", "CKMBINDEX", "TROPONINI" in the last 168 hours. No results for input(s): "PROBNP" in the last 8760 hours. Coagulation Profile: No results for input(s): "INR", "PROTIME" in the last 168 hours. Thyroid Function Tests: No results for input(s): "TSH", "T4TOTAL", "FREET4", "T3FREE", "THYROIDAB" in the last 72 hours. Lipid Profile: No results for input(s): "CHOL", "HDL", "LDLCALC", "TRIG", "CHOLHDL", "LDLDIRECT" in the last 72 hours. Anemia Panel: No results for input(s): "VITAMINB12", "FOLATE", "FERRITIN", "TIBC", "IRON", "RETICCTPCT" in the last 72 hours. Urine analysis:    Component Value Date/Time   COLORURINE STRAW (A) 05/29/2021 2348   APPEARANCEUR CLEAR 05/29/2021 2348   LABSPEC 1.010 05/29/2021 2348   PHURINE 6.0 05/29/2021 2348   GLUCOSEU NEGATIVE 05/29/2021 2348   HGBUR NEGATIVE 05/29/2021 2348   BILIRUBINUR NEGATIVE 05/29/2021 2348   BILIRUBINUR negative 06/14/2015 0930   KETONESUR NEGATIVE 05/29/2021 2348   PROTEINUR NEGATIVE 05/29/2021 2348   UROBILINOGEN 0.2 10/24/2015 1428   NITRITE NEGATIVE 05/29/2021 2348   LEUKOCYTESUR NEGATIVE 05/29/2021 2348   Sepsis Labs: Invalid input(s): "PROCALCITONIN", "LACTICIDVEN"  Microbiology: Recent Results (from the  past 240 hour(s))  Resp Panel by RT-PCR (Flu A&B, Covid) Anterior Nasal Swab     Status: None   Collection Time: 07/12/21  9:20 PM   Specimen: Anterior Nasal Swab  Result Value Ref Range Status   SARS Coronavirus 2 by RT PCR NEGATIVE NEGATIVE Final    Comment: (NOTE) SARS-CoV-2 target nucleic acids are NOT DETECTED.  The SARS-CoV-2 RNA is generally detectable in upper respiratory specimens during the acute phase of infection. The lowest concentration of SARS-CoV-2 viral copies this assay can detect is 138 copies/mL. A negative result does not preclude SARS-Cov-2 infection and should not be used as  the sole basis for treatment or other patient management decisions. A negative result may occur with  improper specimen collection/handling, submission of specimen other than nasopharyngeal swab, presence of viral mutation(s) within the areas targeted by this assay, and inadequate number of viral copies(<138 copies/mL). A negative result must be combined with clinical observations, patient history, and epidemiological information. The expected result is Negative.  Fact Sheet for Patients:  BloggerCourse.com  Fact Sheet for Healthcare Providers:  SeriousBroker.it  This test is no t yet approved or cleared by the [place] FDA and  has been authorized for detection and/or diagnosis of SARS-CoV-2 by FDA under an Emergency Use Authorization (EUA). This EUA will remain  in effect (meaning this test can be used) for the duration of the COVID-19 declaration under Section 564(b)(1) of the Act, 21 U.S.C.section 360bbb-3(b)(1), unless the authorization is terminated  or revoked sooner.       Influenza A by PCR NEGATIVE NEGATIVE Final   Influenza B by PCR NEGATIVE NEGATIVE Final    Comment: (NOTE) The Xpert Xpress SARS-CoV-2/FLU/RSV plus assay is intended as an aid in the diagnosis of influenza from Nasopharyngeal swab specimens and should not be used as a sole basis for treatment. Nasal washings and aspirates are unacceptable for Xpert Xpress SARS-CoV-2/FLU/RSV testing.  Fact Sheet for Patients: BloggerCourse.com  Fact Sheet for Healthcare Providers: SeriousBroker.it  This test is not yet approved or cleared by the [place] FDA and has been authorized for detection and/or diagnosis of SARS-CoV-2 by FDA under an Emergency Use Authorization (EUA). This EUA will remain in effect (meaning this test can be used) for the duration of the COVID-19 declaration under Section 564(b)(1) of  the Act, 21 U.S.C. section 360bbb-3(b)(1), unless the authorization is terminated or revoked.  Performed at [facility], [address], [place], [place] [address]   Resp Panel by RT-PCR (Flu A&B, Covid) Anterior Nasal Swab     Status: None   Collection Time: 07/15/21 10:10 PM   Specimen: Anterior Nasal Swab  Result Value Ref Range Status   SARS Coronavirus 2 by RT PCR NEGATIVE NEGATIVE Final    Comment: (NOTE) SARS-CoV-2 target nucleic acids are NOT DETECTED.  The SARS-CoV-2 RNA is generally detectable in upper respiratory specimens during the acute phase of infection. The lowest concentration of SARS-CoV-2 viral copies this assay can detect is 138 copies/mL. A negative result does not preclude SARS-Cov-2 infection and should not be used as the sole basis for treatment or other patient management decisions. A negative result may occur with  improper specimen collection/handling, submission of specimen other than nasopharyngeal swab, presence of viral mutation(s) within the areas targeted by this assay, and inadequate number of viral copies(<138 copies/mL). A negative result must be combined with clinical observations, patient history, and epidemiological information. The expected result is Negative.  Fact Sheet for Patients:  BloggerCourse.com  Fact Sheet for Healthcare Providers:  SeriousBroker.it  This test is no t yet approved or cleared by the [place] FDA and  has been authorized for detection and/or diagnosis of SARS-CoV-2 by FDA under an Emergency Use Authorization (EUA). This EUA will remain  in effect (meaning this test can be used) for the duration of the COVID-19 declaration under Section 564(b)(1) of the Act, 21 U.S.C.section 360bbb-3(b)(1), unless the authorization is terminated  or revoked sooner.       Influenza A by PCR NEGATIVE NEGATIVE Final   Influenza B by PCR NEGATIVE NEGATIVE Final     Comment: (NOTE) The Xpert Xpress SARS-CoV-2/FLU/RSV plus assay is intended as an aid in the diagnosis of influenza from Nasopharyngeal swab specimens and should not be used as a sole basis for treatment. Nasal washings and aspirates are unacceptable for Xpert Xpress SARS-CoV-2/FLU/RSV testing.  Fact Sheet for Patients: BloggerCourse.com  Fact Sheet for Healthcare Providers: SeriousBroker.it  This test is not yet approved or cleared by the [place] FDA and has been authorized for detection and/or diagnosis of SARS-CoV-2 by FDA under an Emergency Use Authorization (EUA). This EUA will remain in effect (meaning this test can be used) for the duration of the COVID-19 declaration under Section 564(b)(1) of the Act, 21 U.S.C. section 360bbb-3(b)(1), unless the authorization is terminated or revoked.  Performed at [facility], [address] [place], [place] [address]   Resp Panel by RT-PCR (Flu A&B, Covid) Anterior Nasal Swab     Status: None   Collection Time: 07/18/21  9:11 AM   Specimen: Anterior Nasal Swab  Result Value Ref Range Status   SARS Coronavirus 2 by RT PCR NEGATIVE NEGATIVE Final    Comment: (NOTE) SARS-CoV-2 target nucleic acids are NOT DETECTED.  The SARS-CoV-2 RNA is generally detectable in upper respiratory specimens during the acute phase of infection. The lowest concentration of SARS-CoV-2 viral copies this assay can detect is 138 copies/mL. A negative result does not preclude SARS-Cov-2 infection and should not be used as the sole basis for treatment or other patient management decisions. A negative result may occur with  improper specimen collection/handling, submission of specimen other than nasopharyngeal swab, presence of viral mutation(s) within the areas targeted by this assay, and inadequate number of viral copies(<138 copies/mL). A negative result must be combined with clinical  observations, patient history, and epidemiological information. The expected result is Negative.  Fact Sheet for Patients:  BloggerCourse.com  Fact Sheet for Healthcare Providers:  SeriousBroker.it  This test is no t yet approved or cleared by the [place] FDA and  has been authorized for detection and/or diagnosis of SARS-CoV-2 by FDA under an Emergency Use Authorization (EUA). This EUA will remain  in effect (meaning this test can be used) for the duration of the COVID-19 declaration under Section 564(b)(1) of the Act, 21 U.S.C.section 360bbb-3(b)(1), unless the authorization is terminated  or revoked sooner.       Influenza A by PCR NEGATIVE NEGATIVE Final   Influenza B by PCR NEGATIVE NEGATIVE Final    Comment: (NOTE) The Xpert Xpress SARS-CoV-2/FLU/RSV plus assay is intended as an aid in the diagnosis of influenza from Nasopharyngeal swab specimens and should not be used as a sole basis for treatment. Nasal washings and aspirates are unacceptable for Xpert Xpress SARS-CoV-2/FLU/RSV testing.  Fact Sheet for Patients: BloggerCourse.com  Fact Sheet for Healthcare Providers: SeriousBroker.it  This test is not yet approved or cleared by the [place] FDA  and has been authorized for detection and/or diagnosis of SARS-CoV-2 by FDA under an Emergency Use Authorization (EUA). This EUA will remain in effect (meaning this test can be used) for the duration of the COVID-19 declaration under Section 564(b)(1) of the Act, 21 U.S.C. section 360bbb-3(b)(1), unless the authorization is terminated or revoked.  Performed at [facility], [address] [place], [place] [address]   Resp Panel by RT-PCR (Flu A&B, Covid) Anterior Nasal Swab     Status: None   Collection Time: 07/19/21 11:58 PM   Specimen: Anterior Nasal Swab  Result Value Ref Range Status   SARS  Coronavirus 2 by RT PCR NEGATIVE NEGATIVE Final    Comment: (NOTE) SARS-CoV-2 target nucleic acids are NOT DETECTED.  The SARS-CoV-2 RNA is generally detectable in upper respiratory specimens during the acute phase of infection. The lowest concentration of SARS-CoV-2 viral copies this assay can detect is 138 copies/mL. A negative result does not preclude SARS-Cov-2 infection and should not be used as the sole basis for treatment or other patient management decisions. A negative result may occur with  improper specimen collection/handling, submission of specimen other than nasopharyngeal swab, presence of viral mutation(s) within the areas targeted by this assay, and inadequate number of viral copies(<138 copies/mL). A negative result must be combined with clinical observations, patient history, and epidemiological information. The expected result is Negative.  Fact Sheet for Patients:  BloggerCourse.com  Fact Sheet for Healthcare Providers:  SeriousBroker.it  This test is no t yet approved or cleared by the [place] FDA and  has been authorized for detection and/or diagnosis of SARS-CoV-2 by FDA under an Emergency Use Authorization (EUA). This EUA will remain  in effect (meaning this test can be used) for the duration of the COVID-19 declaration under Section 564(b)(1) of the Act, 21 U.S.C.section 360bbb-3(b)(1), unless the authorization is terminated  or revoked sooner.       Influenza A by PCR NEGATIVE NEGATIVE Final   Influenza B by PCR NEGATIVE NEGATIVE Final    Comment: (NOTE) The Xpert Xpress SARS-CoV-2/FLU/RSV plus assay is intended as an aid in the diagnosis of influenza from Nasopharyngeal swab specimens and should not be used as a sole basis for treatment. Nasal washings and aspirates are unacceptable for Xpert Xpress SARS-CoV-2/FLU/RSV testing.  Fact Sheet for  Patients: BloggerCourse.com  Fact Sheet for Healthcare Providers: SeriousBroker.it  This test is not yet approved or cleared by the [place] FDA and has been authorized for detection and/or diagnosis of SARS-CoV-2 by FDA under an Emergency Use Authorization (EUA). This EUA will remain in effect (meaning this test can be used) for the duration of the COVID-19 declaration under Section 564(b)(1) of the Act, 21 U.S.C. section 360bbb-3(b)(1), unless the authorization is terminated or revoked.  Performed at [facility], [address] [place], [place] [address]     Radiology Studies: No results found.    Hagar Sadiq T. Chase Arnall [facility]  If 7PM-7AM, please contact night-coverage [email or web address] 07/22/2021, 3:41 PM

## 2021-07-22 NOTE — Plan of Care (Signed)
  Problem: Education: Goal: Knowledge of General Education information will improve Description: Including pain rating scale, medication(s)/side effects and non-pharmacologic comfort measures Outcome: Not Progressing   Problem: Health Behavior/Discharge Planning: Goal: Ability to manage health-related needs will improve Outcome: Not Progressing   Problem: Coping: Goal: Level of anxiety will decrease Outcome: Not Progressing   Problem: Safety: Goal: Ability to remain free from injury will improve Outcome: Not Progressing

## 2021-07-22 NOTE — Progress Notes (Signed)
[name], [name], spoke with Alma Friendly in [facility] (admissions coordinator) r/e suicide ideation room supplies. Including: tape, zip cords, and paper bags for trash. Was told the supplies were not available, as the office is being renovated. Once the supplies are found, was told they will be delivered to the floor.  0925- Patient arrived onto unit into room 2W15 Shower curtain removed, phone, and shower cord rolled and taped up. Safety sitter at bedside

## 2021-07-22 NOTE — Progress Notes (Signed)
1636: suicide precaution kit delivered. Shower cord zip tied & outlets covered

## 2021-07-22 NOTE — Progress Notes (Signed)
0628- Attempted to contact on-call psych NP/APP r/e bedtime does of Seroquel for the patient. Patient states she is hearing voices & seeing hallucinations.   Secure chat sent to Dr. Cyndia Skeeters for possible one time dose

## 2021-07-22 NOTE — Progress Notes (Signed)
I arrived to patient's room to administer morning medications. [place] was in the bathroom with the door closed, found hugging the shower curtain. I pulled the shower curtain back and [name] said "I don't want to be seen". She refused to exit the bathroom, so subsequently security was called. Patient exited bathroom after the call was placed. She took her PO ativan and gabapentin. She says "I'm not trying to hurt myself, I just want to go across the street" [facility].   SI sitter at bedside educated to keep door open, always have a line of sight to the patient.

## 2021-07-22 NOTE — Plan of Care (Signed)
  Problem: Education: Goal: Knowledge of General Education information will improve Description: Including pain rating scale, medication(s)/side effects and non-pharmacologic comfort measures Outcome: Not Progressing   Problem: Health Behavior/Discharge Planning: Goal: Ability to manage health-related needs will improve Outcome: Not Progressing   Problem: Clinical Measurements: Goal: Ability to maintain clinical measurements within normal limits will improve Outcome: Progressing Goal: Will remain free from infection Outcome: Progressing Goal: Diagnostic test results will improve Outcome: Progressing Goal: Respiratory complications will improve Outcome: Completed/Met Goal: Cardiovascular complication will be avoided Outcome: Completed/Met

## 2021-07-22 NOTE — Consult Note (Signed)
[facility] New Face-to-Face Psychiatric Evaluation   Service Date: July 22, 2021 LOS:  LOS: 0 days    Assessment  [name] is a 31 y.o. female admitted medically for 07/20/2021  8:56 PM for a suicide attempt by overdose. She carries the psychiatric diagnoses of borderline personality disorder, schizoaffective disorder (vs bipolar disorder), PTSD autism and recurrent self-harm. She  has a past medical history of fragile X carrier, acid reflux, asthma, constipation, GERD, essential tremor, ?seizures. It should be noted that despite documentation of autism and ?IDD patient has historically been capable of meaningfully participating in group programming on inpatient psychiatric units. Psychiatry was consulted for overdose by Neva Seat, MD.    Her current presentation of overdose with suicidal intent is most consistent with depressive phase of known mental illness. Does appear to be less impulsive than prior attempts as evidenced by pt presented to various Eds at least 2x in 48 hours prior to attempt (leaving AMA or contracting for safety). She remains ambivalent about suicide attempt and has minimal insight into the severity of her behavior - fixated on admission to a particular psychiatric hospital [facility]) throughout admission. She reports good response to current psychotropic regimen (although has had numerous recent suicide attempts on said regimen); it is biologically implausible for the voices to return immediately if she is a few minutes late on a dose. Regardless, she requires inpatient psychiatric hospitalization when medically stable and should be faxed to outside facilities if [facility] does not have an appropriate bed.   Diagnoses:  Active Hospital problems: Principal Problem:   Overdose, intentional self-harm, initial encounter [facility]) Active Problems:   Schizoaffective disorder ([place])   Borderline personality disorder ([place])   PTSD (post-traumatic stress  disorder)   Asthma   Migraines   Seizure disorder ([place])   Adjustment disorder with mixed disturbance of emotions and conduct   MDD (major depressive disorder), recurrent episode, severe ([place])   GAD (generalized anxiety disorder)   Paranoia ([place])   Bipolar I disorder, current or most recent episode depressed, with psychotic features ([place])     Plan  ## Safety and Observation Level:  - Based on my clinical evaluation, I estimate the patient to be at high risk of self harm in the current setting - At this time, we recommend a 1:1 level of observation. This decision is based on my review of the chart including patient's history and current presentation, interview of the patient, mental status examination, and consideration of suicide risk including evaluating suicidal ideation, plan, intent, suicidal or self-harm behaviors, risk factors, and protective factors. This judgment is based on our ability to directly address suicide risk, implement suicide prevention strategies and develop a safety plan while the patient is in the clinical setting. Please contact our team if there is a concern that risk level has changed.   ## Medications:  -- continue gabapentin 300 TID (no evidence AKI) -- OK to restart propranalol if pt becomes hypertensive/tachy (last BP 98/57) -- Likely supratherapeutic on other meds including vraylar, lexapro (longer half lives)- would not restart until Sunday/Monday.  ## Medical Decision Making Capacity:  Not formally assessed  ## Further Work-up:  -- per poison control  -- will likely need repeat A1c when on psych unit (last value in Jan)  -- most recent EKG on 6/16 had QtC of <500 -- Pertinent labwork reviewed earlier this admission includes: tsh wnl 05/01/21, a1c wnl 03/01/21, lipid profile with elevated cholesterol/LDL/triglycerides in 05/01/21  ## Disposition:  --  inpt psych   Discussed recommendations of inpatient hospitalization with primary team via secure chat.  [facility] currently has no available bed at this time per [facility] and patient will need to be faxed out as to not delay care.  Thank you for this consult request. Recommendations have been communicated to the primary team.  We will continue to follow at this time.  Psychiatry to follow up on Monday if not placed.   Ival Bible, MD   New history  Relevant Aspects of Hospital Course:  Admitted on 07/20/2021 for suicidal overdose. Had had several recent ED presentations for ideations - brief .  - presented to ED on 6/13 after trying to cut her throat with a knife - pt presented to [facility] on 6/14 with SI and recently cut neck - contracted for safety  - seems to have presented again to ED shortly after dc from [facility] - left AMA. At some point in all this cited ex coming into town as a safety factor - presented to ED late PM of 6/15 after taking all of her medications (argument with ex) - found by EMS surrounded by emesis with some pills PANTOPRAZOLE 29 X '10MG'$   '2900MG'$  Vraylar 14x '3mg'$   '42mg'$  Lexapro  14x  '10mg'$   '140mg'$  Seoquel  14 x '50mg'$   '700mg'$  Gabapentin  44x '300mg'$    13,'200mg'$  - similar presentation 6/7 however refused admission to [facility] bc of ?having to pay for an uber and was voluntary so discharged  Patient Report:  Patient seen in her room today. She is found laying in bed in NAD with sitter present. Patient recounts when led to hospitalization. She states that she has been attempting to "Get help" for the last several days but was discharged- she states "I cut my neck and they let me go". Patient reports [place] instructing her to harm herself. She denies HI/VH. She denies active SI currently but describes experiencing passive SI. She is focused on receiving treatment; "I need to go to behavioral health". Discussed with patient that the current recommendation is inpatient. Patient verbalized understanding and was in agreement.  Psychiatric History:  Information collected from pt, medical record.   "intellectual disability, autism, bipolar disorder, depression, anxiety, and borderline personality disorder  Numerous SA and many lifetime hospitalizations. Has been on most psychotropic medications.    Social History:  Living with ex husband   Tobacco use: denies Alcohol use: rare Drug use: denies  Family History:  The patient's family history includes Asthma in her father; Mental illness in her father; PDD in her brother; Seizures in her brother.  Medical History: Past Medical History:  Diagnosis Date   Acid reflux    Anxiety    Asthma    last attack 03/13/15 or 03/14/15   Autism    Carrier of fragile X syndrome    Chronic constipation    Depression    Drug-seeking behavior    Essential tremor    Headache    Overdose of acetaminophen 07/2017   and other meds   Personality disorder ([place])    Schizo-affective psychosis ([place])    Schizoaffective disorder, bipolar type ([place])    Seizures ([place])    Last seizure December 2017   Sleep apnea     Surgical History: Past Surgical History:  Procedure Laterality Date   MOUTH SURGERY  2009 or 2010    Medications:   Current Facility-Administered Medications:    albuterol (PROVENTIL) (2.5 MG/3ML) 0.083% nebulizer solution 3 mL, 3 mL, Inhalation, Q6H PRN,  Marcelyn Bruins, MD   diclofenac Sodium (VOLTAREN) 1 % topical gel 2 g, 2 g, Topical, QID, Gonfa, Taye T, MD, 2 g at 07/22/21 1015   enoxaparin (LOVENOX) injection 40 mg, 40 mg, Subcutaneous, Q24H, Marcelyn Bruins, MD, 40 mg at 07/21/21 0032   gabapentin (NEURONTIN) capsule 300 mg, 300 mg, Oral, TID, Cinderella, Margaret A, 300 mg at 07/22/21 0902   ibuprofen (ADVIL) tablet 600 mg, 600 mg, Oral, TID WC PRN, Cyndia Skeeters, Taye T, MD, 600 mg at 07/22/21 0030   LORazepam (ATIVAN) tablet 1 mg, 1 mg, Oral, Q4H PRN, Howerter, Justin B, DO, 1 mg at 07/22/21 0902   melatonin tablet 3 mg, 3 mg, Oral, QHS, Gonfa, Taye T, MD   mometasone-formoterol (DULERA) 200-5 MCG/ACT inhaler 2 puff, 2  puff, Inhalation, BID, Marcelyn Bruins, MD, 2 puff at 07/21/21 1937   propranolol (INDERAL) tablet 10 mg, 10 mg, Oral, BID, Gonfa, Taye T, MD, 10 mg at 07/22/21 1015   sodium chloride flush (NS) 0.9 % injection 3 mL, 3 mL, Intravenous, Q12H, Marcelyn Bruins, MD, 3 mL at 07/21/21 9735  Allergies: Allergies  Allergen Reactions   Bee Venom Anaphylaxis   Coconut Flavor Anaphylaxis and Rash   Geodon [Ziprasidone Hcl] Other (See Comments)    Pt states that this medication causes paralysis of the mouth.     Haloperidol And Related Other (See Comments)    Pt states that this medication causes paralysis of the mouth, jaw locks up   Lithobid [Lithium] Other (See Comments)    Seizure-like activity    Other Anaphylaxis and Other (See Comments)    Coconut (fruit) - Anaphylaxis (Pt allergic to both coconut flavor and fruit)   Roxicodone [Oxycodone] Other (See Comments)    Hallucinations    Seroquel [Quetiapine] Other (See Comments)    Severe drowsiness Pt currently taking 50 mg, she is ok with this strength, she does not want higher dose.   Shellfish Allergy Anaphylaxis   Phenergan [Promethazine Hcl] Other (See Comments)    Chest pain     Prilosec [Omeprazole] Nausea And Vomiting   Sulfa Antibiotics Other (See Comments)    Chest pain    Tegretol [Carbamazepine] Nausea And Vomiting   Prozac [Fluoxetine] Other (See Comments)    Increased Depression Suicidal thoughts   Tape Other (See Comments)    Skin tears, can only tolerate paper tape.   Tylenol [Acetaminophen] Other (See Comments)    Unknown reaction       Objective  Vital signs:  Temp:  [98.1 F (36.7 C)-98.3 F (36.8 C)] 98.2 F (36.8 C) (06/17 1026) Pulse Rate:  [70-91] 80 (06/17 1026) Resp:  [15-18] 15 (06/17 1026) BP: (98-114)/(47-59) 114/55 (06/17 1026) SpO2:  [97 %-100 %] 99 % (06/17 1026) Weight:  [92.6 kg] 92.6 kg (06/16 1431)  Psychiatric Specialty Exam:  Presentation  General Appearance: Appropriate for  Environment; Casual (laying in bed in burgundy scrubs)  Eye Contact:Fair  Speech:Clear and Coherent; Normal Rate  Speech Volume:Normal  Handedness:Ambidextrous   Mood and Affect  Mood:Anxious; Depressed  Affect:Congruent; Depressed   Thought Process  Thought Processes:Coherent; Goal Directed; Linear (goal with admission to psychiatric hospital)  Descriptions of Associations:Loose  Orientation:Full (Time, Place and Person)  Thought Content:WDL; Rumination (ruminative on psychiatric admission\)  History of Schizophrenia/Schizoaffective disorder:Yes  Duration of Psychotic Symptoms:Greater than six months  Hallucinations:Hallucinations: Auditory; Command Description of Command Hallucinations: CAH to kill herself Description of Auditory Hallucinations: CAH to kill self  Ideas of Reference:None  Suicidal Thoughts:Suicidal Thoughts: Yes, Passive  Homicidal Thoughts:Homicidal Thoughts: No   Sensorium  Memory:Immediate Good; Recent Good; Remote Good  Judgment:Poor  Insight:Shallow   Executive Functions  Concentration:Fair  Attention Span:Fair  [place]   Psychomotor Activity  Psychomotor Activity:Psychomotor Activity: Normal  Assets  Assets:Communication Skills; Desire for Improvement   Sleep  Sleep:Sleep: Fair   Physical Exam: Physical Exam HENT:     Head: Normocephalic.     Comments: Short hair Pulmonary:     Effort: Pulmonary effort is normal.  Neurological:     Mental Status: She is alert and oriented to person, place, and time.  Psychiatric:     Comments: Impaired insight and judgment     Blood pressure (!) 114/55, pulse 80, temperature 98.2 F (36.8 C), temperature source Oral, resp. rate 15, height '5\' 4"'$  (1.626 m), weight 92.6 kg, last menstrual period 06/20/2021, SpO2 99 %. Body mass index is 35.04 kg/m.

## 2021-07-23 DIAGNOSIS — F4325 Adjustment disorder with mixed disturbance of emotions and conduct: Secondary | ICD-10-CM | POA: Diagnosis not present

## 2021-07-23 DIAGNOSIS — F315 Bipolar disorder, current episode depressed, severe, with psychotic features: Secondary | ICD-10-CM | POA: Diagnosis not present

## 2021-07-23 MED ORDER — HYDROXYZINE HCL 10 MG PO TABS
10.0000 mg | ORAL_TABLET | Freq: Three times a day (TID) | ORAL | Status: DC | PRN
Start: 2021-07-23 — End: 2021-07-24
  Administered 2021-07-23: 10 mg via ORAL
  Filled 2021-07-23: qty 1

## 2021-07-23 MED ORDER — ESCITALOPRAM OXALATE 10 MG PO TABS
10.0000 mg | ORAL_TABLET | Freq: Every day | ORAL | Status: DC
  Administered 2021-07-23 – 2021-07-24 (×2): 10 mg via ORAL
  Filled 2021-07-23 (×2): qty 1

## 2021-07-23 MED ORDER — QUETIAPINE FUMARATE 50 MG PO TABS
50.0000 mg | ORAL_TABLET | Freq: Every day | ORAL | Status: DC
  Administered 2021-07-23: 50 mg via ORAL
  Filled 2021-07-23 (×3): qty 1

## 2021-07-23 MED ORDER — CARIPRAZINE HCL 1.5 MG PO CAPS
3.0000 mg | ORAL_CAPSULE | Freq: Every day | ORAL | Status: DC
  Administered 2021-07-23 – 2021-07-24 (×2): 3 mg via ORAL
  Filled 2021-07-23: qty 1
  Filled 2021-07-23 (×3): qty 2

## 2021-07-23 NOTE — Progress Notes (Signed)
[facility]                                                                               [name], is a 31 y.o. female, DOB - Jul 12, 1990, SAY:[id number] Admit date - 07/20/2021    Outpatient Primary MD for the patient is Pcp, No  LOS - 0  days    Brief summary   31 year old F with PMH of anxiety, depression, schizoaffective disorder, adjustment disorder, borderline personality disorder, PTSD, bipolar disorder, prior suicide attempts and overdose, autism, asthma, migraine, seizure and abnormal uterine bleed, admitted due to suicide attempt with overdose.  She states getting into argument with her "ex" and taking the following medications to commit suicide:   Pantoprazole 29x '10MG'$ ,  '2900MG'$  Vraylar 14x '3mg'$ ,  '42mg'$  Lexapro  14x  '10mg'$ ,  '140mg'$  Seoquel  14 x '50mg'$ ,  '700mg'$  Gabapentin  44x '300mg'$ ,   13,'200mg'$    In ED, vitals, CMP and CBC stable.  UDS positive for oxazepam.  EtOH level, Tylenol level, salicylate level pregnancy test,, influenza, COVID-19 and EKG without significant finding.  Poison control consulted and recommended observation for about 10 hours to monitor for CNS depression and hypotension.  They also recommended activated charcoal but patient refused.  Patient does IVCD in ED and admitted for observation.   The next day, patient remained stable.  Medically cleared by poison control.  Psychiatry recommended inpatient psych.   Assessment & Plan    Assessment and Plan:  Suicide attempt with overdose in the setting of bipolar disorder/adjustment disorder/schizoaffective disorder Patient medically stable for discharge to psychiatric facility.  IVC paperwork completed by EDP Patient restarted on her home medications today.    Anxiety/bipolar disorder/PTSD/MDD/adjustment disorder/schizoaffective disorder/autism Restarted home medications as per psychiatry recommendations.   Migraine headache Appears to have resolved.  Anemia of chronic  disease Hemoglobin relatively stable.   Mild intermittent asthma No wheezing heard, continue with home inhalers.    GERD Continue with PPI   Obesity Encourage lifestyle changes to lose weight Outpatient follow-up with PCP.     Estimated body mass index is 35.04 kg/m as calculated from the following:   Height as of this encounter: '5\' 4"'$  (1.626 m).   Weight as of this encounter: 92.6 kg.  Code Status: full code.  DVT Prophylaxis:  enoxaparin (LOVENOX) injection 40 mg Start: 07/20/21 2330   Level of Care: Level of care: Med-Surg Family Communication: none at bedside.   Disposition Plan:     pt medically stable for discharge.  Procedures:  None.  Consultants:   Psychiatry.  Antimicrobials:   Anti-infectives (From admission, onward)    None        Medications  Scheduled Meds:  cariprazine  3 mg Oral Daily   diclofenac Sodium  2 g Topical QID   enoxaparin (LOVENOX) injection  40 mg Subcutaneous Q24H   escitalopram  10 mg Oral Daily   gabapentin  300 mg Oral TID   melatonin  3 mg Oral QHS   mometasone-formoterol  2 puff Inhalation BID   multivitamin with minerals  1 tablet Oral Daily   pantoprazole  40 mg Oral Daily   propranolol  10 mg Oral BID  QUEtiapine  50 mg Oral QHS   sodium chloride flush  3 mL Intravenous Q12H   Continuous Infusions: PRN Meds:.albuterol, ibuprofen, LORazepam    Subjective:   [name] was seen and examined today.  PT wants to know when she can go to psychiatry facility.   Objective:   Vitals:   07/22/21 0412 07/22/21 0851 07/22/21 1026 07/22/21 2104  BP: (!) 102/47 (!) 113/49 (!) 114/55 (!) 122/54  Pulse: 70 91 80 (!) 104  Resp: '16 18 15 19  '$ Temp: 98.1 F (36.7 C) 98.2 F (36.8 C) 98.2 F (36.8 C) 99.2 F (37.3 C)  TempSrc: Oral Oral Oral Oral  SpO2: 100% 99% 99% 100%  Weight:      Height:       No intake or output data in the 24 hours ending 07/23/21 1017 Filed Weights   07/21/21 1431  Weight: 92.6  kg     Exam General: Alert and oriented x 3, NAD Cardiovascular: S1 S2 auscultated, no murmurs, RRR Respiratory: Clear to auscultation bilaterally, no wheezing, rales or rhonchi Gastrointestinal: Soft, nontender, nondistended, + bowel sounds Ext: no pedal edema bilaterally Neuro: AAOx3, Cr N's II- XII. Strength 5/5 upper and lower extremities bilaterally Skin: No rashes Psych: Normal affect and demeanor, alert and oriented x3    Data Reviewed:  I have personally reviewed following labs and imaging studies   CBC Lab Results  Component Value Date   WBC 6.9 07/21/2021   RBC 3.77 (L) 07/21/2021   HGB 9.8 (L) 07/21/2021   HCT 31.7 (L) 07/21/2021   MCV 84.1 07/21/2021   MCH 26.0 07/21/2021   PLT 242 07/21/2021   MCHC 30.9 07/21/2021   RDW 14.6 07/21/2021   LYMPHSABS 2.7 07/20/2021   MONOABS 0.5 07/20/2021   EOSABS 0.1 07/20/2021   BASOSABS 0.0 62/69/4854     Last metabolic panel Lab Results  Component Value Date   NA 140 07/21/2021   K 3.8 07/21/2021   CL 112 (H) 07/21/2021   CO2 22 07/21/2021   BUN 13 07/21/2021   CREATININE 0.61 07/21/2021   GLUCOSE 106 (H) 07/21/2021   GFRNONAA >60 07/21/2021   GFRAA >60 08/20/2019   CALCIUM 8.5 (L) 07/21/2021   PHOS 2.6 07/23/2017   PROT 5.6 (L) 07/21/2021   ALBUMIN 3.2 (L) 07/21/2021   LABGLOB 2.6 05/31/2015   AGRATIO 1.8 05/31/2015   BILITOT 0.7 07/21/2021   ALKPHOS 50 07/21/2021   AST 16 07/21/2021   ALT 12 07/21/2021   ANIONGAP 6 07/21/2021    CBG (last 3)  Recent Labs    07/20/21 2159  GLUCAP 91      Coagulation Profile: No results for input(s): "INR", "PROTIME" in the last 168 hours.   Radiology Studies: No results found.     Hosie Poisson M.D. [facility] 07/23/2021, 10:17 AM  Available via Epic secure chat 7am-7pm After 7 pm, please refer to night coverage provider listed on amion.

## 2021-07-23 NOTE — Progress Notes (Signed)
Patient arrived to [place] room 19 alert and oriented x4. 1:1 sitter at bedside. Will continue to monitor patient

## 2021-07-24 DIAGNOSIS — G40909 Epilepsy, unspecified, not intractable, without status epilepticus: Secondary | ICD-10-CM | POA: Diagnosis present

## 2021-07-24 DIAGNOSIS — J4520 Mild intermittent asthma, uncomplicated: Secondary | ICD-10-CM | POA: Diagnosis present

## 2021-07-24 DIAGNOSIS — Y92009 Unspecified place in unspecified non-institutional (private) residence as the place of occurrence of the external cause: Secondary | ICD-10-CM | POA: Diagnosis not present

## 2021-07-24 DIAGNOSIS — G43909 Migraine, unspecified, not intractable, without status migrainosus: Secondary | ICD-10-CM | POA: Diagnosis present

## 2021-07-24 DIAGNOSIS — F4310 Post-traumatic stress disorder, unspecified: Secondary | ICD-10-CM | POA: Diagnosis present

## 2021-07-24 DIAGNOSIS — T1491XA Suicide attempt, initial encounter: Secondary | ICD-10-CM | POA: Diagnosis present

## 2021-07-24 DIAGNOSIS — Z91030 Bee allergy status: Secondary | ICD-10-CM | POA: Diagnosis not present

## 2021-07-24 DIAGNOSIS — F22 Delusional disorders: Secondary | ICD-10-CM | POA: Diagnosis present

## 2021-07-24 DIAGNOSIS — Z888 Allergy status to other drugs, medicaments and biological substances status: Secondary | ICD-10-CM | POA: Diagnosis not present

## 2021-07-24 DIAGNOSIS — Z818 Family history of other mental and behavioral disorders: Secondary | ICD-10-CM | POA: Diagnosis not present

## 2021-07-24 DIAGNOSIS — Z825 Family history of asthma and other chronic lower respiratory diseases: Secondary | ICD-10-CM | POA: Diagnosis not present

## 2021-07-24 DIAGNOSIS — F315 Bipolar disorder, current episode depressed, severe, with psychotic features: Secondary | ICD-10-CM | POA: Diagnosis not present

## 2021-07-24 DIAGNOSIS — F603 Borderline personality disorder: Secondary | ICD-10-CM | POA: Diagnosis present

## 2021-07-24 DIAGNOSIS — D638 Anemia in other chronic diseases classified elsewhere: Secondary | ICD-10-CM | POA: Diagnosis present

## 2021-07-24 DIAGNOSIS — Z885 Allergy status to narcotic agent status: Secondary | ICD-10-CM | POA: Diagnosis not present

## 2021-07-24 DIAGNOSIS — T43222A Poisoning by selective serotonin reuptake inhibitors, intentional self-harm, initial encounter: Secondary | ICD-10-CM | POA: Diagnosis present

## 2021-07-24 DIAGNOSIS — T43592A Poisoning by other antipsychotics and neuroleptics, intentional self-harm, initial encounter: Secondary | ICD-10-CM | POA: Diagnosis present

## 2021-07-24 DIAGNOSIS — F4325 Adjustment disorder with mixed disturbance of emotions and conduct: Secondary | ICD-10-CM | POA: Diagnosis present

## 2021-07-24 DIAGNOSIS — F411 Generalized anxiety disorder: Secondary | ICD-10-CM | POA: Diagnosis present

## 2021-07-24 DIAGNOSIS — Z20822 Contact with and (suspected) exposure to covid-19: Secondary | ICD-10-CM | POA: Diagnosis present

## 2021-07-24 DIAGNOSIS — T471X2A Poisoning by other antacids and anti-gastric-secretion drugs, intentional self-harm, initial encounter: Secondary | ICD-10-CM | POA: Diagnosis present

## 2021-07-24 DIAGNOSIS — F840 Autistic disorder: Secondary | ICD-10-CM | POA: Diagnosis present

## 2021-07-24 DIAGNOSIS — T426X2A Poisoning by other antiepileptic and sedative-hypnotic drugs, intentional self-harm, initial encounter: Secondary | ICD-10-CM | POA: Diagnosis present

## 2021-07-24 DIAGNOSIS — Z7951 Long term (current) use of inhaled steroids: Secondary | ICD-10-CM | POA: Diagnosis not present

## 2021-07-24 DIAGNOSIS — E669 Obesity, unspecified: Secondary | ICD-10-CM | POA: Diagnosis present

## 2021-07-24 DIAGNOSIS — Z79899 Other long term (current) drug therapy: Secondary | ICD-10-CM | POA: Diagnosis not present

## 2021-07-24 DIAGNOSIS — F250 Schizoaffective disorder, bipolar type: Secondary | ICD-10-CM | POA: Diagnosis present

## 2021-07-24 MED ORDER — HYDROXYZINE HCL 25 MG PO TABS
25.0000 mg | ORAL_TABLET | Freq: Three times a day (TID) | ORAL | Status: DC | PRN
  Administered 2021-07-24: 25 mg via ORAL
  Filled 2021-07-24 (×2): qty 1

## 2021-07-24 NOTE — Progress Notes (Signed)
Attempted to get pt v/s she refused and told me "not to touch her"

## 2021-07-24 NOTE — Progress Notes (Signed)
Pt was accept to [facility] PENDING night shift  Pt meets inpatient criteria per Corky Sox, MD  Attending Physician will be Dr. Berdine Addison  Report can be called to: -Adult unit: [phone]  Care Team notified: [facility] Day shift Lynnda Shields, RN, Corky Sox, MD, Samule Dry, RN  Nadara Mode, [place] 07/24/2021 @ 4:42 PM

## 2021-07-24 NOTE — Discharge Summary (Signed)
Physician Discharge Summary   Patient: [name] MRN: [id number] DOB: [birth year]  Admit date:     07/20/2021  Discharge date: 07/24/21  Discharge Physician: Hosie Poisson   PCP: Pcp, No   Recommendations at discharge:  Please follow up with PCP in 1 to 2 weeks.   Discharge Diagnoses: Principal Problem:   Overdose, intentional self-harm, initial encounter ([place]) Active Problems:   Schizoaffective disorder ([place])   Adjustment disorder with mixed disturbance of emotions and conduct   Borderline personality disorder (HCC)   PTSD (post-traumatic stress disorder)   Asthma   Migraines   Seizure disorder (HCC)   MDD (major depressive disorder), recurrent episode, severe ([place])   GAD (generalized anxiety disorder)   Paranoia ([place])   Suicide attempt ([place])   Bipolar I disorder, current or most recent episode depressed, with psychotic features [facility])    Hospital Course:  -year-old F with PMH of anxiety, depression, schizoaffective disorder, adjustment disorder, borderline personality disorder, PTSD, bipolar disorder, prior suicide attempts and overdose, autism, asthma, migraine, seizure and abnormal uterine bleed, admitted due to suicide attempt with overdose.  She states getting into argument with her "ex" and taking the following medications to commit suicide:   Pantoprazole 29x '10MG'$ ,  '2900MG'$  Vraylar 14x '3mg'$ ,  '42mg'$  Lexapro  14x  '10mg'$ ,  '140mg'$  Seoquel  14 x '50mg'$ ,  '700mg'$  Gabapentin  44x '300mg'$ ,   13,'200mg'$    In ED, vitals, CMP and CBC stable.  UDS positive for oxazepam.  EtOH level, Tylenol level, salicylate level pregnancy test,, influenza, COVID-19 and EKG without significant finding.  Poison control consulted and recommended observation for about 10 hours to monitor for CNS depression and hypotension.  They also recommended activated charcoal but patient refused.  Patient does IVCD in ED and admitted for observation.   The next day, patient remained stable.  Medically cleared  by poison control.  Psychiatry recommended inpatient psych.   Assessment and Plan:  Suicide attempt with overdose in the setting of bipolar disorder/adjustment disorder/schizoaffective disorder Patient medically stable for discharge to psychiatric facility.  IVC paperwork completed by EDP Patient restarted on her home medications today.       Anxiety/bipolar disorder/PTSD/MDD/adjustment disorder/schizoaffective disorder/autism Restarted home medications as per psychiatry recommendations.     Migraine headache Appears to have resolved.   Anemia of chronic disease Hemoglobin relatively stable.     Mild intermittent asthma No wheezing heard, continue with home inhalers.       GERD Continue with PPI     Obesity Encourage lifestyle changes to lose weight Outpatient follow-up with PCP.         Estimated body mass index is 35.04 kg/m as calculated from the following:   Height as of this encounter: '5\' 4"'$  (1.626 m).   Weight as of this encounter: 92.6 kg.    Consultants: psychiatry.  Procedures performed: none.   Disposition:  [place] Diet recommendation:  Discharge Diet Orders (From admission, onward)     Start     Ordered   07/24/21 0000  Diet - low sodium heart healthy        07/24/21 1525           Regular diet DISCHARGE MEDICATION: Allergies as of 07/24/2021       Reactions   Bee Venom Anaphylaxis   Coconut Flavor Anaphylaxis, Rash   Geodon [ziprasidone Hcl] Other (See Comments)   Pt states that this medication causes paralysis of the mouth.     Haloperidol And Related Other (See Comments)  Pt states that this medication causes paralysis of the mouth, jaw locks up   Lithobid [lithium] Other (See Comments)   Seizure-like activity   Other Anaphylaxis, Other (See Comments)   Coconut (fruit) - Anaphylaxis (Pt allergic to both coconut flavor and fruit)   Roxicodone [oxycodone] Other (See Comments)   Hallucinations    Seroquel [quetiapine] Other (See  Comments)   Severe drowsiness Pt currently taking 50 mg, she is ok with this strength, she does not want higher dose.   Shellfish Allergy Anaphylaxis   Phenergan [promethazine Hcl] Other (See Comments)   Chest pain    Prilosec [omeprazole] Nausea And Vomiting   Sulfa Antibiotics Other (See Comments)   Chest pain    Tegretol [carbamazepine] Nausea And Vomiting   Prozac [fluoxetine] Other (See Comments)   Increased Depression Suicidal thoughts   Tape Other (See Comments)   Skin tears, can only tolerate paper tape.   Tylenol [acetaminophen] Other (See Comments)   Unknown reaction        Medication List     STOP taking these medications    neomycin-bacitracin-polymyxin [phone] Oint       TAKE these medications    albuterol 108 (90 Base) MCG/ACT inhaler Commonly known as: VENTOLIN HFA Inhale 2 puffs into the lungs every 6 (six) hours as needed for wheezing or shortness of breath.   B-12 PO Take 1 tablet by mouth daily.   BIOTIN PO Take 3 capsules by mouth daily.   budesonide-formoterol 160-4.5 MCG/ACT inhaler Commonly known as: SYMBICORT Inhale 2 puffs into the lungs 2 (two) times daily.   calcium carbonate 750 MG chewable tablet Commonly known as: TUMS EX Chew 1,500 mg by mouth 2 (two) times daily as needed for heartburn.   cariprazine 3 MG capsule Commonly known as: VRAYLAR Take 1 capsule (3 mg total) by mouth daily.   diclofenac Sodium 1 % Gel Commonly known as: Voltaren Apply 2 g topically 4 (four) times daily.   escitalopram 10 MG tablet Commonly known as: LEXAPRO Take 1 tablet (10 mg total) by mouth daily.   gabapentin 300 MG capsule Commonly known as: NEURONTIN Take 1 capsule (300 mg total) by mouth 3 (three) times daily for 7 days.   hydrOXYzine 25 MG tablet Commonly known as: ATARAX Take 1 tablet (25 mg total) by mouth 3 (three) times daily as needed for anxiety.   ibuprofen 200 MG tablet Commonly known as: ADVIL Take 600 mg by mouth  every 6 (six) hours as needed for headache or mild pain.   ICY HOT MAX LIDOCAINE EX Apply 1 Application topically daily as needed (pain).   melatonin 3 MG Tabs tablet Take 3 mg by mouth at bedtime.   multivitamin with minerals Tabs tablet Take 1 tablet by mouth daily.   pantoprazole 40 MG tablet Commonly known as: PROTONIX TAKE 1 TABLET BY MOUTH DAILY What changed: how to take this   propranolol 10 MG tablet Commonly known as: INDERAL Take 1 tablet (10 mg total) by mouth 2 (two) times daily.   QUEtiapine 50 MG tablet Commonly known as: SEROQUEL Take 1 tablet (50 mg total) by mouth at bedtime for 7 days.   thiamine 100 MG tablet Take 1 tablet (100 mg total) by mouth daily.        Discharge Exam: Filed Weights   07/21/21 1431  Weight: 92.6 kg   General exam: Appears calm and comfortable  Respiratory system: Clear to auscultation. Respiratory effort normal. Cardiovascular system: S1 & S2 heard, RRR.  No JVD, murmurs, rubs, gallops or clicks. No pedal edema. Gastrointestinal system: Abdomen is nondistended, soft and nontender. No organomegaly or masses felt. Normal bowel sounds heard. Central nervous system: Alert and oriented. No focal neurological deficits. Extremities: Symmetric 5 x 5 power. Skin: No rashes, lesions or ulcers Psychiatry: Judgement and insight appear normal. Mood & affect appropriate.    Condition at discharge: fair  The results of significant diagnostics from this hospitalization (including imaging, microbiology, ancillary and laboratory) are listed below for reference.   Imaging Studies: [name] EKG SITE RITE  Result Date: 07/01/2021 If Site Rite image not attached, placement could not be confirmed due to current cardiac rhythm.   Microbiology: Results for orders placed or performed during the hospital encounter of 07/19/21  Resp Panel by RT-PCR (Flu A&B, Covid) Anterior Nasal Swab     Status: None   Collection Time: 07/19/21 11:58 PM   Specimen:  Anterior Nasal Swab  Result Value Ref Range Status   SARS Coronavirus 2 by RT PCR NEGATIVE NEGATIVE Final    Comment: (NOTE) SARS-CoV-2 target nucleic acids are NOT DETECTED.  The SARS-CoV-2 RNA is generally detectable in upper respiratory specimens during the acute phase of infection. The lowest concentration of SARS-CoV-2 viral copies this assay can detect is 138 copies/mL. A negative result does not preclude SARS-Cov-2 infection and should not be used as the sole basis for treatment or other patient management decisions. A negative result may occur with  improper specimen collection/handling, submission of specimen other than nasopharyngeal swab, presence of viral mutation(s) within the areas targeted by this assay, and inadequate number of viral copies(<138 copies/mL). A negative result must be combined with clinical observations, patient history, and epidemiological information. The expected result is Negative.  Fact Sheet for Patients:  EntrepreneurPulse.com.au  Fact Sheet for Healthcare Providers:  IncredibleEmployment.be  This test is no t yet approved or cleared by the [place] FDA and  has been authorized for detection and/or diagnosis of SARS-CoV-2 by FDA under an Emergency Use Authorization (EUA). This EUA will remain  in effect (meaning this test can be used) for the duration of the COVID-19 declaration under Section 564(b)(1) of the Act, 21 U.S.C.section 360bbb-3(b)(1), unless the authorization is terminated  or revoked sooner.       Influenza A by PCR NEGATIVE NEGATIVE Final   Influenza B by PCR NEGATIVE NEGATIVE Final    Comment: (NOTE) The Xpert Xpress SARS-CoV-2/FLU/RSV plus assay is intended as an aid in the diagnosis of influenza from Nasopharyngeal swab specimens and should not be used as a sole basis for treatment. Nasal washings and aspirates are unacceptable for Xpert Xpress SARS-CoV-2/FLU/RSV testing.  Fact  Sheet for Patients: EntrepreneurPulse.com.au  Fact Sheet for Healthcare Providers: IncredibleEmployment.be  This test is not yet approved or cleared by the [place] FDA and has been authorized for detection and/or diagnosis of SARS-CoV-2 by FDA under an Emergency Use Authorization (EUA). This EUA will remain in effect (meaning this test can be used) for the duration of the COVID-19 declaration under Section 564(b)(1) of the Act, 21 U.S.C. section 360bbb-3(b)(1), unless the authorization is terminated or revoked.  Performed at [facility], [place] [address] [place], [place] [address]     Labs: CBC: Recent Labs  Lab 07/18/21 0920 07/20/21 2130 07/21/21 0400  WBC 8.4 8.3 6.9  NEUTROABS 5.0 5.0  --   HGB 11.7* 10.6* 9.8*  HCT 37.5 33.9* 31.7*  MCV 84.1 82.9 84.1  PLT 239 272 242  Basic Metabolic Panel: Recent Labs  Lab 07/18/21 0920 07/20/21 2130 07/21/21 0400  NA 138 140 140  K 3.8 3.9 3.8  CL 109 111 112*  CO2 22 21* 22  GLUCOSE 99 94 106*  BUN '15 16 13  '$ CREATININE 0.60 0.61 0.61  CALCIUM 9.2 8.9 8.5*   Liver Function Tests: Recent Labs  Lab 07/18/21 0920 07/20/21 2130 07/21/21 0400  AST '16 17 16  '$ ALT '16 15 12  '$ ALKPHOS 56 53 50  BILITOT 0.3 0.5 0.7  PROT 6.8 6.3* 5.6*  ALBUMIN 3.9 3.6 3.2*   CBG: Recent Labs  Lab 07/20/21 2159  GLUCAP 91    Discharge time spent: 36 minutes.   Signed: Hosie Poisson, MD [facility] 07/24/2021

## 2021-07-24 NOTE — Consult Note (Signed)
Brief Psychiatry Consult Note  Saw pt briefly to inform she was accepted to [facility]. Endorsed no recent [place] (sometime over the weekend), but SI with plan to "take pills" or "jump out of that window" as recently as a few minutes ago. Speech a little rapid but not pressured. Will put in orders to facilitate admit.   Laraina Sulton A Shante Archambeault

## 2021-07-25 ENCOUNTER — Inpatient Hospital Stay (HOSPITAL_COMMUNITY)
Admission: AD | Admit: 2021-07-25 | Discharge: 2021-07-28 | DRG: 885 | Disposition: A | Discharge status: Home / Self Care | Payer: PPO | Source: Intra-hospital | Attending: Psychiatry | Admitting: Psychiatry

## 2021-07-25 ENCOUNTER — Encounter (HOSPITAL_COMMUNITY): Payer: Self-pay | Admitting: Psychiatry

## 2021-07-25 ENCOUNTER — Other Ambulatory Visit: Payer: Self-pay

## 2021-07-25 DIAGNOSIS — Z79899 Other long term (current) drug therapy: Secondary | ICD-10-CM | POA: Diagnosis not present

## 2021-07-25 DIAGNOSIS — F4310 Post-traumatic stress disorder, unspecified: Secondary | ICD-10-CM | POA: Diagnosis not present

## 2021-07-25 DIAGNOSIS — T1491XA Suicide attempt, initial encounter: Secondary | ICD-10-CM | POA: Diagnosis not present

## 2021-07-25 DIAGNOSIS — F603 Borderline personality disorder: Secondary | ICD-10-CM | POA: Diagnosis not present

## 2021-07-25 DIAGNOSIS — R12 Heartburn: Secondary | ICD-10-CM | POA: Diagnosis not present

## 2021-07-25 DIAGNOSIS — R4589 Other symptoms and signs involving emotional state: Secondary | ICD-10-CM | POA: Diagnosis present

## 2021-07-25 DIAGNOSIS — T471X2A Poisoning by other antacids and anti-gastric-secretion drugs, intentional self-harm, initial encounter: Secondary | ICD-10-CM | POA: Diagnosis not present

## 2021-07-25 DIAGNOSIS — R000 Tachycardia, unspecified: Secondary | ICD-10-CM | POA: Diagnosis not present

## 2021-07-25 DIAGNOSIS — J45909 Unspecified asthma, uncomplicated: Secondary | ICD-10-CM | POA: Diagnosis not present

## 2021-07-25 DIAGNOSIS — T426X2A Poisoning by other antiepileptic and sedative-hypnotic drugs, intentional self-harm, initial encounter: Secondary | ICD-10-CM | POA: Diagnosis present

## 2021-07-25 DIAGNOSIS — Z818 Family history of other mental and behavioral disorders: Secondary | ICD-10-CM

## 2021-07-25 DIAGNOSIS — F314 Bipolar disorder, current episode depressed, severe, without psychotic features: Secondary | ICD-10-CM | POA: Diagnosis not present

## 2021-07-25 DIAGNOSIS — F411 Generalized anxiety disorder: Secondary | ICD-10-CM | POA: Diagnosis not present

## 2021-07-25 DIAGNOSIS — Z87891 Personal history of nicotine dependence: Secondary | ICD-10-CM

## 2021-07-25 DIAGNOSIS — F845 Asperger's syndrome: Secondary | ICD-10-CM | POA: Diagnosis present

## 2021-07-25 DIAGNOSIS — F840 Autistic disorder: Secondary | ICD-10-CM | POA: Diagnosis present

## 2021-07-25 DIAGNOSIS — T50901A Poisoning by unspecified drugs, medicaments and biological substances, accidental (unintentional), initial encounter: Secondary | ICD-10-CM | POA: Diagnosis present

## 2021-07-25 HISTORY — DX: Other symptoms and signs involving emotional state: R45.89

## 2021-07-25 HISTORY — DX: Bipolar disorder, current episode depressed, severe, without psychotic features: F31.4

## 2021-07-25 LAB — SARS CORONAVIRUS 2 (TAT 6-24 HRS): SARS Coronavirus 2: NEGATIVE

## 2021-07-25 MED ORDER — PROPRANOLOL HCL 10 MG PO TABS
10.0000 mg | ORAL_TABLET | Freq: Two times a day (BID) | ORAL | Status: DC
  Administered 2021-07-25 – 2021-07-28 (×7): 10 mg via ORAL
  Filled 2021-07-25 (×11): qty 1

## 2021-07-25 MED ORDER — MAGNESIUM HYDROXIDE 400 MG/5ML PO SUSP: 30.0000 mL | Freq: Every day | ORAL | Status: DC | PRN

## 2021-07-25 MED ORDER — HYDROXYZINE HCL 25 MG PO TABS
25.0000 mg | ORAL_TABLET | Freq: Three times a day (TID) | ORAL | Status: DC | PRN
  Administered 2021-07-25 – 2021-07-27 (×4): 25 mg via ORAL
  Filled 2021-07-25 (×4): qty 1

## 2021-07-25 MED ORDER — QUETIAPINE FUMARATE 50 MG PO TABS
50.0000 mg | ORAL_TABLET | Freq: Every day | ORAL | Status: DC
  Administered 2021-07-25 – 2021-07-27 (×3): 50 mg via ORAL
  Filled 2021-07-25 (×5): qty 1

## 2021-07-25 MED ORDER — MELATONIN 3 MG PO TABS
3.0000 mg | ORAL_TABLET | Freq: Every day | ORAL | Status: DC
  Administered 2021-07-25 – 2021-07-27 (×3): 3 mg via ORAL
  Filled 2021-07-25 (×5): qty 1

## 2021-07-25 MED ORDER — ALUM & MAG HYDROXIDE-SIMETH 200-200-20 MG/5ML PO SUSP: 30.0000 mL | ORAL | Status: DC | PRN

## 2021-07-25 MED ORDER — IBUPROFEN 600 MG PO TABS
600.0000 mg | ORAL_TABLET | Freq: Three times a day (TID) | ORAL | Status: DC | PRN
  Administered 2021-07-25 – 2021-07-26 (×2): 600 mg via ORAL
  Filled 2021-07-25 (×2): qty 1

## 2021-07-25 MED ORDER — PANTOPRAZOLE SODIUM 40 MG PO TBEC
40.0000 mg | DELAYED_RELEASE_TABLET | Freq: Every day | ORAL | Status: DC
  Administered 2021-07-25 – 2021-07-28 (×4): 40 mg via ORAL
  Filled 2021-07-25 (×6): qty 1

## 2021-07-25 MED ORDER — MOMETASONE FURO-FORMOTEROL FUM 200-5 MCG/ACT IN AERO
2.0000 | INHALATION_SPRAY | Freq: Two times a day (BID) | RESPIRATORY_TRACT | Status: DC
  Administered 2021-07-25 – 2021-07-28 (×7): 2 via RESPIRATORY_TRACT
  Filled 2021-07-25: qty 8.8

## 2021-07-25 MED ORDER — GABAPENTIN 300 MG PO CAPS
300.0000 mg | ORAL_CAPSULE | Freq: Three times a day (TID) | ORAL | Status: DC
  Administered 2021-07-25 – 2021-07-28 (×11): 300 mg via ORAL
  Filled 2021-07-25 (×17): qty 1

## 2021-07-25 MED ORDER — LORAZEPAM 1 MG PO TABS
1.0000 mg | ORAL_TABLET | ORAL | Status: DC | PRN
  Administered 2021-07-25: 1 mg via ORAL
  Filled 2021-07-25: qty 1

## 2021-07-25 MED ORDER — ESCITALOPRAM OXALATE 10 MG PO TABS
10.0000 mg | ORAL_TABLET | Freq: Every day | ORAL | Status: DC
  Administered 2021-07-25 – 2021-07-26 (×2): 10 mg via ORAL
  Filled 2021-07-25 (×3): qty 1

## 2021-07-25 MED ORDER — ALBUTEROL SULFATE (2.5 MG/3ML) 0.083% IN NEBU
3.0000 mL | INHALATION_SOLUTION | Freq: Four times a day (QID) | RESPIRATORY_TRACT | Status: DC | PRN
  Filled 2021-07-25: qty 3

## 2021-07-25 MED ORDER — ADULT MULTIVITAMIN W/MINERALS CH
1.0000 | ORAL_TABLET | Freq: Every day | ORAL | Status: DC
  Administered 2021-07-25 – 2021-07-28 (×4): 1 via ORAL
  Filled 2021-07-25 (×6): qty 1

## 2021-07-25 MED ORDER — CARIPRAZINE HCL 3 MG PO CAPS
3.0000 mg | ORAL_CAPSULE | Freq: Every day | ORAL | Status: DC
  Administered 2021-07-25 – 2021-07-28 (×4): 3 mg via ORAL
  Filled 2021-07-25 (×6): qty 1

## 2021-07-25 MED ORDER — DICLOFENAC SODIUM 1 % EX GEL
2.0000 g | Freq: Four times a day (QID) | CUTANEOUS | Status: DC
  Administered 2021-07-25 – 2021-07-28 (×9): 2 g via TOPICAL
  Filled 2021-07-25: qty 100

## 2021-07-25 NOTE — Progress Notes (Signed)
[place] Group Notes:  (Nursing/MHT/Case Management/Adjunct)  Date:  07/25/2021  Time: 2015  Type of Therapy:   wrap up group  Participation Level:  Active  Participation Quality:  Appropriate, Attentive, Sharing, and Supportive  Affect:  Irritable  Cognitive:  Lacking  Insight:  Limited  Engagement in Group:  Engaged  Modes of Intervention:  Clarification, Education, and Support  Summary of Progress/Problems: Positive thinking and self-care were discussed.   Shellia Cleverly 07/25/2021, 9:13 PM

## 2021-07-25 NOTE — Tx Team (Signed)
Initial Treatment Plan 07/25/2021 4:47 AM [name] LID:[id number]    PATIENT STRESSORS: Financial difficulties   Marital or family conflict   Traumatic event     PATIENT STRENGTHS: Average or above average intelligence  Communication skills  Motivation for treatment/growth    PATIENT IDENTIFIED PROBLEMS: Suicide attempt  Anxiety   " Learning how to be patient"  " Help with suicide ideation"               DISCHARGE CRITERIA:  Ability to meet basic life and health needs Improved stabilization in mood, thinking, and/or behavior Motivation to continue treatment in a less acute level of care Verbal commitment to aftercare and medication compliance  PRELIMINARY DISCHARGE PLAN: Attend aftercare/continuing care group Attend PHP/IOP Outpatient therapy Return to previous living arrangement  PATIENT/FAMILY INVOLVEMENT: This treatment plan has been presented to and reviewed with the patient, [name], and/or family member.  The patient and family have been given the opportunity to ask questions and make suggestions.  Wolfgang Phoenix, RN 07/25/2021, 4:47 AM

## 2021-07-25 NOTE — Plan of Care (Signed)
  Problem: Education: Goal: Mental status will improve Outcome: Not Progressing   Problem: Education: Goal: Verbalization of understanding the information provided will improve Outcome: Progressing   Problem: Activity: Goal: Interest or engagement in leisure activities will improve Outcome: Not Progressing
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
## 2021-07-25 NOTE — Progress Notes (Signed)
Patient was inpatient, agitated,constant on the call light in waiting for her Covid test result,finally discharged to behavioral health after  Covid test result obtained.

## 2021-07-25 NOTE — Progress Notes (Signed)
[name] is a 31 y.o. female involuntarily admitted for  suicide attempt by cutting her left side of the neck with a kitchen knife. Patient reported stressors includes, bills, not being able to see her daughter and not having a job. Patient reported last suicide attempt was 1.5 weeks ago with an attempted overdose on pills. Patient reported hallucinations of "seeing the devil" and hearing the devil say "kill yourself kill yourself." Pt presented fidgety with pressured speech, denied SI/HI and contracted for safety. Skin search revealed laceration to left arm and left neck. Consents signed, belongings search completed and pt oriented to unit. Pt stable at this time. Pt given the opportunity to express concerns and ask questions. Pt given toiletries. Will continue to monitor.
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
## 2021-07-25 NOTE — Progress Notes (Signed)
   07/25/21 1138  Psych Admission Type (Psych Patients Only)  Admission Status Involuntary  Psychosocial Assessment  Patient Complaints Anxiety;Worrying;Irritability;Restlessness  Eye Contact Fair  Facial Expression Anxious  Affect Preoccupied  Speech Rapid;Pressured  Interaction Assertive;Needy  Motor Activity Fidgety;Pacing  Appearance/Hygiene Unremarkable  Behavior Characteristics Cooperative  Mood Anxious;Irritable  Thought Process  Coherency Tangential;Concrete thinking  Content Preoccupation  Delusions None reported or observed  Perception WDL  Hallucination None reported or observed  Judgment Poor  Confusion None  Danger to Self  Current suicidal ideation? Denies  Self-Injurious Behavior No self-injurious ideation or behavior indicators observed or expressed   Agreement Not to Harm Self Yes  Description of Agreement verbal  Danger to Others  Danger to Others None reported or observed  Danger to Others Abnormal  Harmful Behavior to others No threats or harm toward other people  Destructive Behavior No threats or harm toward property

## 2021-07-25 NOTE — BHH Suicide Risk Assessment (Signed)
[facility] INPATIENT:  Family/Significant Other Suicide Prevention Education  Suicide Prevention Education:  Patient Refusal for Family/Significant Other Suicide Prevention Education: The patient [name] has refused to provide written consent for family/significant other to be provided Family/Significant Other Suicide Prevention Education during admission and/or prior to discharge.  Physician notified.  Hisao Doo, Alphia Kava 07/25/2021, 2:19 PM

## 2021-07-25 NOTE — Group Note (Signed)
Recreation Therapy Group Note   Group Topic:Animal Assisted Therapy   Group Date: 07/25/2021 Start Time: 1430 End Time: 1515 Facilitators: Victorino Sparrow, LRT,CTRS Location: [address] Dayroom   Animal-Assisted Activity (AAA) Program Checklist/Progress Notes Patient Eligibility Criteria Checklist & Daily Group note for Rec Tx Intervention  AAA/T Program Assumption of Risk Form signed by Patient/ or Parent Legal Guardian Yes  Patient understands his/her participation is voluntary Yes   Affect/Mood: N/A   Participation Level: Did not attend    Clinical Observations/Individualized Feedback:     Plan: Continue to engage patient in RT group sessions 2-3x/week.   Victorino Sparrow, Glennis Brink  07/25/2021 3:31 PM
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
## 2021-07-26 ENCOUNTER — Encounter (HOSPITAL_COMMUNITY): Payer: Self-pay

## 2021-07-26 MED ORDER — ESCITALOPRAM OXALATE 5 MG PO TABS
15.0000 mg | ORAL_TABLET | Freq: Every day | ORAL | Status: DC
  Administered 2021-07-27 – 2021-07-28 (×2): 15 mg via ORAL
  Filled 2021-07-26 (×4): qty 3

## 2021-07-26 MED ORDER — ALBUTEROL SULFATE HFA 108 (90 BASE) MCG/ACT IN AERS
2.0000 | INHALATION_SPRAY | Freq: Four times a day (QID) | RESPIRATORY_TRACT | Status: DC | PRN
Start: 2021-07-26 — End: 2021-07-28
  Administered 2021-07-26 – 2021-07-28 (×2): 2 via RESPIRATORY_TRACT

## 2021-07-26 MED ORDER — BACITRACIN-NEOMYCIN-POLYMYXIN OINTMENT TUBE
TOPICAL_OINTMENT | Freq: Two times a day (BID) | CUTANEOUS | Status: DC
  Filled 2021-07-26 (×2): qty 14.17

## 2021-07-26 MED ORDER — PRAZOSIN HCL 1 MG PO CAPS
1.0000 mg | ORAL_CAPSULE | Freq: Every day | ORAL | Status: DC
  Administered 2021-07-26 – 2021-07-27 (×2): 1 mg via ORAL
  Filled 2021-07-26 (×4): qty 1

## 2021-07-26 MED ORDER — ALBUTEROL SULFATE HFA 108 (90 BASE) MCG/ACT IN AERS
INHALATION_SPRAY | RESPIRATORY_TRACT | Status: AC
  Filled 2021-07-26: qty 6.7

## 2021-07-26 NOTE — Group Note (Signed)
LCSW Group Therapy Note   Group Date: 07/26/2021 Start Time: 1300 End Time: 1400   Type of Therapy and Topic:  Group Therapy: Boundaries  Participation Level:  Did Not Attend  Description of Group: This group will address the use of boundaries in their personal lives. Patients will explore why boundaries are important, the difference between healthy and unhealthy boundaries, and negative and postive outcomes of different boundaries and will look at how boundaries can be crossed.  Patients will be encouraged to identify current boundaries in their own lives and identify what kind of boundary is being set. Facilitators will guide patients in utilizing problem-solving interventions to address and correct types boundaries being used and to address when no boundary is being used. Understanding and applying boundaries will be explored and addressed for obtaining and maintaining a balanced life. Patients will be encouraged to explore ways to assertively make their boundaries and needs known to significant others in their lives, using other group members and facilitator for role play, support, and feedback.  Therapeutic Goals:  1.  Patient will identify areas in their life where setting clear boundaries could be used to improve their life.  2.  Patient will identify signs/triggers that a boundary is not being respected. 3.  Patient will identify two ways to set boundaries in order to achieve balance in their lives: 4.  Patient will demonstrate ability to communicate their needs and set boundaries through discussion and/or role plays  Summary of Patient Progress:   Patient did not attend group despite encouraged participation.   Therapeutic Modalities:   Cognitive Behavioral Therapy Solution-Focused Therapy  [name] 07/26/2021  1:45 PM
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
## 2021-07-26 NOTE — BHH Group Notes (Signed)
PT was notified but did not attend group. PT has been defiant and seems aggravated when in the hall.

## 2021-07-26 NOTE — Progress Notes (Signed)
   07/25/21 2200  Psych Admission Type (Psych Patients Only)  Admission Status Involuntary  Psychosocial Assessment  Patient Complaints Anxiety;Worrying  Eye Contact Fair  Facial Expression Anxious  Affect Depressed;Preoccupied  Speech Pressured;Argumentative  Interaction Needy  Motor Activity Rigidity;Pacing  Appearance/Hygiene Unremarkable  Behavior Characteristics Appropriate to situation  Mood Pleasant  Thought Process  Coherency Tangential  Content Preoccupation  Delusions None reported or observed  Perception WDL  Hallucination None reported or observed  Judgment Poor  Confusion None  Danger to Self  Current suicidal ideation? Denies  Self-Injurious Behavior No self-injurious ideation or behavior indicators observed or expressed   Agreement Not to Harm Self Yes  Description of Agreement verbal  Danger to Others  Danger to Others None reported or observed  Danger to Others Abnormal  Harmful Behavior to others No threats or harm toward other people  Destructive Behavior No threats or harm toward property

## 2021-07-26 NOTE — BH IP Treatment Plan (Signed)
Interdisciplinary Treatment and Diagnostic Plan Update  07/26/2021 Time of Session: 9:30am  [name] MRN: [id number]  Principal Diagnosis: Bipolar 1 disorder, depressed, severe ([place])  Secondary Diagnoses: Principal Problem:   Bipolar 1 disorder, depressed, severe ([place]) Active Problems:   Borderline personality disorder ([place])   Autism   PTSD (post-traumatic stress disorder)   Tachycardia   Overdose   GAD (generalized anxiety disorder)   Suicidal behavior   Current Medications:  Current Facility-Administered Medications  Medication Dose Route Frequency Provider Last Rate Last Admin   albuterol (PROVENTIL) (2.5 MG/3ML) 0.083% nebulizer solution 3 mL  3 mL Inhalation Q6H PRN Cinderella, Margaret A       alum & mag hydroxide-simeth (MAALOX/MYLANTA) 200-200-20 MG/5ML suspension 30 mL  30 mL Oral Q4H PRN Cinderella, Margaret A       cariprazine (VRAYLAR) capsule 3 mg  3 mg Oral Daily Cinderella, Margaret A   3 mg at 07/26/21 7048   diclofenac Sodium (VOLTAREN) 1 % topical gel 2 g  2 g Topical QID Cinderella, Margaret A   2 g at 07/26/21 0915   escitalopram (LEXAPRO) tablet 10 mg  10 mg Oral Daily Cinderella, Margaret A   10 mg at 07/26/21 8891   gabapentin (NEURONTIN) capsule 300 mg  300 mg Oral TID Cinderella, Margaret A   300 mg at 07/26/21 6945   hydrOXYzine (ATARAX) tablet 25 mg  25 mg Oral TID PRN Cinderella, Margaret A   25 mg at 07/25/21 1716   ibuprofen (ADVIL) tablet 600 mg  600 mg Oral TID WC PRN Cinderella, Margaret A   600 mg at 07/25/21 0451   magnesium hydroxide (MILK OF MAGNESIA) suspension 30 mL  30 mL Oral Daily PRN Cinderella, Margaret A       melatonin tablet 3 mg  3 mg Oral QHS Cinderella, Margaret A   3 mg at 07/25/21 2116   mometasone-formoterol (DULERA) 200-5 MCG/ACT inhaler 2 puff  2 puff Inhalation BID Cinderella, Margaret A   2 puff at 07/26/21 0916   multivitamin with minerals tablet 1 tablet  1 tablet Oral Daily Cinderella, Margaret A   1 tablet at  07/26/21 0916   pantoprazole (PROTONIX) EC tablet 40 mg  40 mg Oral Daily Cinderella, Margaret A   40 mg at 07/26/21 0916   propranolol (INDERAL) tablet 10 mg  10 mg Oral BID Cinderella, Margaret A   10 mg at 07/26/21 0388   QUEtiapine (SEROQUEL) tablet 50 mg  50 mg Oral QHS Cinderella, Margaret A   50 mg at 07/25/21 2116   PTA Medications: Medications Prior to Admission  Medication Sig Dispense Refill Last Dose   albuterol (VENTOLIN HFA) 108 (90 Base) MCG/ACT inhaler Inhale 2 puffs into the lungs every 6 (six) hours as needed for wheezing or shortness of breath.   Past Week   BIOTIN PO Take 3 capsules by mouth daily.   Past Week   budesonide-formoterol (SYMBICORT) 160-4.5 MCG/ACT inhaler Inhale 2 puffs into the lungs 2 (two) times daily.   Past Week   calcium carbonate (TUMS EX) 750 MG chewable tablet Chew 1,500 mg by mouth 2 (two) times daily as needed for heartburn.   Past Week   cariprazine (VRAYLAR) 3 MG capsule Take 1 capsule (3 mg total) by mouth daily. 30 capsule  Past Week   Cyanocobalamin (B-12 PO) Take 1 tablet by mouth daily.   Past Week   diclofenac Sodium (VOLTAREN) 1 % GEL Apply 2 g topically 4 (four) times daily. 100  g 0 Past Week   escitalopram (LEXAPRO) 10 MG tablet Take 1 tablet (10 mg total) by mouth daily.   Past Week   hydrOXYzine (ATARAX) 25 MG tablet Take 1 tablet (25 mg total) by mouth 3 (three) times daily as needed for anxiety. 30 tablet 0 Past Week   Lidocaine-Menthol (ICY HOT MAX LIDOCAINE EX) Apply 1 Application topically daily as needed (pain).   Past Week   melatonin 3 MG TABS tablet Take 3 mg by mouth at bedtime.   Past Week   Multiple Vitamin (MULTIVITAMIN WITH MINERALS) TABS tablet Take 1 tablet by mouth daily. 30 tablet 0 Past Week   pantoprazole (PROTONIX) 40 MG tablet TAKE 1 TABLET BY MOUTH DAILY (Patient taking differently: 40 mg daily.) 30 tablet 0 Past Week   propranolol (INDERAL) 10 MG tablet Take 1 tablet (10 mg total) by mouth 2 (two) times daily.    Past Week   thiamine 100 MG tablet Take 1 tablet (100 mg total) by mouth daily. 30 tablet 0 Past Week   gabapentin (NEURONTIN) 300 MG capsule Take 1 capsule (300 mg total) by mouth 3 (three) times daily for 7 days. 21 capsule 0    ibuprofen (ADVIL) 200 MG tablet Take 600 mg by mouth every 6 (six) hours as needed for headache or mild pain.   Unknown   QUEtiapine (SEROQUEL) 50 MG tablet Take 1 tablet (50 mg total) by mouth at bedtime for 7 days. 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   Physician Treatment Plan for Primary Diagnosis: Bipolar 1 disorder, depressed, severe ([place]) Long Term Goal(s):     Short Term Goals:    Medication Management: Evaluate patient's response, side effects, and tolerance of medication regimen.  Therapeutic Interventions: 1 to 1 sessions, Unit Group sessions and Medication administration.  Evaluation of Outcomes: Not Met  Physician Treatment Plan for Secondary Diagnosis: Principal Problem:   Bipolar 1 disorder, depressed, severe ([place]) Active Problems:   Borderline personality disorder ([place])   Autism   PTSD (post-traumatic stress disorder)   Tachycardia   Overdose   GAD (generalized anxiety disorder)   Suicidal behavior  Long Term Goal(s):     Short Term Goals:       Medication Management: Evaluate patient's response, side effects, and tolerance of medication regimen.  Therapeutic Interventions: 1 to 1 sessions, Unit Group sessions and Medication administration.  Evaluation of Outcomes: Not Met   RN Treatment Plan for Primary Diagnosis: Bipolar 1 disorder, depressed, severe ([place]) Long Term Goal(s): Knowledge of  disease and therapeutic regimen to maintain health will improve  Short Term Goals: Ability to remain free from injury will improve, Ability to participate in decision making will improve, Ability to verbalize feelings will improve, Ability to disclose and discuss suicidal ideas, and Ability to identify and develop effective coping behaviors will improve  Medication Management: RN will administer medications as ordered by provider, will assess and evaluate patient's response and provide education to patient for prescribed medication. RN will report any adverse and/or side effects to prescribing provider.  Therapeutic Interventions: 1 on 1 counseling sessions, Psychoeducation, Medication administration, Evaluate responses to treatment, Monitor vital signs and CBGs as ordered, Perform/monitor CIWA, COWS, AIMS and  Fall Risk screenings as ordered, Perform wound care treatments as ordered.  Evaluation of Outcomes: Not Met   LCSW Treatment Plan for Primary Diagnosis: Bipolar 1 disorder, depressed, severe ([place]) Long Term Goal(s): Safe transition to appropriate next level of care at discharge, Engage patient in therapeutic group addressing interpersonal concerns.  Short Term Goals: Engage patient in aftercare planning with referrals and resources, Increase social support, Increase emotional regulation, Facilitate acceptance of mental health diagnosis and concerns, Identify triggers associated with mental health/substance abuse issues, and Increase skills for wellness and recovery  Therapeutic Interventions: Assess for all discharge needs, 1 to 1 time with Social worker, Explore available resources and support systems, Assess for adequacy in community support network, Educate family and significant other(s) on suicide prevention, Complete Psychosocial Assessment, Interpersonal group therapy.  Evaluation of Outcomes: Not Met   Progress in Treatment: Attending groups: No. Participating in groups:  No. Taking medication as prescribed: Yes. Toleration medication: Yes. Family/Significant other contact made: No, will contact:  Patient declined Consents  Patient understands diagnosis: No. Discussing patient identified problems/goals with staff: Yes. Medical problems stabilized or resolved: Yes. Denies suicidal/homicidal ideation: Yes. Issues/concerns per patient self-inventory: No.   New problem(s) identified: No, Describe:  None   New Short Term/Long Term Goal(s): medication stabilization, elimination of SI thoughts, development of comprehensive mental wellness plan.   Patient Goals: Pt refused to provide a goal.  Discharge Plan or Barriers: Patient recently admitted. CSW will continue to follow and assess for appropriate referrals and possible discharge planning.   Reason for Continuation of Hospitalization: Anxiety Depression Medication stabilization Suicidal ideation  Estimated Length of Stay: 3 to 7 days   Last 3 [place] Suicide Severity Risk Score: [facility] Admission (Current) from 07/25/2021 in [facility] ED to [facility]-Admission (Discharged) from 07/20/2021 in [facility] from 07/19/2021 in [facility] No Risk Error: Question 6 not populated High Risk       Last PHQ 2/9 Scores:    06/27/2021    3:18 AM 05/01/2021    2:16 AM 02/03/2020    2:24 AM  Depression screen PHQ 2/9  Decreased Interest _0 Down, Depressed, Hopeless _1 PHQ - 2 Score _2 Altered sleeping 0 0 3  Tired, decreased energy _3 Change in appetite 0 1 3  Feeling bad or failure about yourself  _4 Trouble concentrating _5 Moving slowly or fidgety/restless _6 Suicidal thoughts _7 PHQ-9 Score _8 Difficult doing work/chores Somewhat difficult Extremely dIfficult     Scribe for Treatment Team: Darleen Crocker, Latanya Presser 07/26/2021 11:12  AM

## 2021-07-26 NOTE — Progress Notes (Addendum)
[facility] MD Progress Note  07/26/2021 12:32 PM [name]  MRN:  [id number]  Subjective:   Patient is a 31 year old female with a reported past psychiatric history of "intellectual disability, autism, bipolar disorder, depression, anxiety, and borderline personality disorder" who was admitted to the psychiatric hospital from [facility] after suicide attempt via overdose on reported 7 day supply (IM note says 14 day supply?):  Pantoprazole, Vraylar, Lexapro, Seroquel, gabapentin.  Patient was admitted to [facility], and was discharged from the medical floor there, and admitted to the psychiatric hospital.  Yesterday the psychiatry team made the following recommendations: -Continue vraylar 3 mg once daily for bipolar disorder. -continue lexapro 10 mg once daily -Continue gabapentin to 300 mg tid  -Continue propranolol 10 mg twice daily -Continue Lexapro 10 mg once daily Continue Seroquel 50 mg nightly, this is providing good benefit for the patient for insomnia, anxiety, and mood stabilization, and this was started during the previous psychiatric admission -Continue vistaril 25 mg tid for increased anxiety   On my exam today, the patient is tearful, dysphoric.  She appears agitated and irritable.  She reports her mood is depressed, angry, and anxious.  She reports having poor sleep last night, due to nightmares, which are chronic.  She reports being on medication the past for this, but cannot recall the name of it. She reports that appetite is okay, and concentration is poor, chronic. She reports feeling paranoid, and acknowledges this is due to elevated level of anxiety.  She denies having any other psychotic symptoms such as hallucinations. She reports suicidal thoughts continue today, but are less frequent and less intense, without any intent or plan.  She contracts for safety on the unit.  Denies HI. She denies any side affects to continuing psychiatric medications upon  admission to the psychiatric unit. We discussed at length today, patient was prioritize long-term goals and also accomplishing urgent goals in her life, to achieve stability outside the hospital, including having a stable place to live, and completing exams she needs for school and work.  Elevated blood pressure this morning, nurse instructed to recheck manually. Per nurse, vital at 6:15 AM, are  the correct vitals. and the vitals at 6:17 AM are not correct, and were entered incorrectly.  Patient was agreeable with increasing Lexapro for anxiety and depression.  She is agreeable to starting prazosin for nightmares.   Principal Problem: Bipolar 1 disorder, depressed, severe ([place]) Diagnosis: Principal Problem:   Bipolar 1 disorder, depressed, severe ([place]) Active Problems:   Borderline personality disorder ([place])   Autism   PTSD (post-traumatic stress disorder)   Tachycardia   Overdose   GAD (generalized anxiety disorder)   Suicidal behavior  Total Time spent with patient: 20 minutes  Past Psychiatric History:  Diagnosis: Reported "intellectual disability, autism, bipolar disorder, depression, anxiety, borderline personality disorder" Patient reports history of multiple psychiatric hospitalizations Patient reports history of multiple suicide attempts Current medications: See above Past psychiatric medication history: " I have taken so many I cannot recall.  But I do recall that Tegretol made blood come out of my mouth".   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 fragile X syndrome    Chronic constipation    Depression    Drug-seeking behavior    Essential tremor    Headache  Overdose of acetaminophen 07/2017   and other meds   Personality disorder ([place])    Schizo-affective psychosis ([place])    Schizoaffective disorder, bipolar type ([place])    Seizures ([place])    Last  seizure December 2017   Sleep apnea     Past Surgical History:  Procedure Laterality Date   MOUTH SURGERY  2009 or 2010   Family History:  Family History  Problem Relation Age of Onset   Mental illness Father    Asthma Father    PDD Brother    Seizures Brother    Family Psychiatric  History:  Father: Depression.  Brother: Autism.   Patient reports father and 2 uncles attempted suicide.  Social History:  Social History   Substance and Sexual Activity  Alcohol Use No   Alcohol/week: 1.0 standard drink of alcohol   Types: 1 Standard drinks or equivalent per week   Comment: denies at this time     Social History   Substance and Sexual Activity  Drug Use No   Comment: History of cocaine use at age 58 for 4 months    Social History   Socioeconomic History   Marital status: Widowed    Spouse name: Not on file   Number of children: 0   Years of education: Not on file   Highest education level: Not on file  Occupational History   Occupation: disability  Tobacco Use   Smoking status: Former    Packs/day: 0.00    Types: Cigarettes   Smokeless tobacco: Never   Tobacco comments:    Smoked for 2  years age [age]  Vaping Use   Vaping Use: Never used  Substance and Sexual Activity   Alcohol use: No    Alcohol/week: 1.0 standard drink of alcohol    Types: 1 Standard drinks or equivalent per week    Comment: denies at this time   Drug use: No    Comment: History of cocaine use at age 1 for 4 months   Sexual activity: Not Currently    Birth control/protection: None  Other Topics Concern   Not on file  Social History Narrative   Marital status: Widowed      Children: daughter      Lives: with boyfriend, in two story home      Employment:  Disability      Tobacco: quit smoking; smoked for two years.      Alcohol ;none      Drugs: none   Has not traveled outside of the country.   Right handed         Social Determinants of Health   Financial Resource Strain:  Not on file  Food Insecurity: Not on file  Transportation Needs: Not on file  Physical Activity: Not on file  Stress: Not on file  Social Connections: Not on file   Additional Social History:                         Sleep: Poor  Appetite:  Fair  Current Medications: Current Facility-Administered Medications  Medication Dose Route Frequency Provider Last Rate Last Admin   albuterol (PROVENTIL) (2.5 MG/3ML) 0.083% nebulizer solution 3 mL  3 mL Inhalation Q6H PRN Cinderella, Margaret A       alum & mag hydroxide-simeth (MAALOX/MYLANTA) 200-200-20 MG/5ML suspension 30 mL  30 mL Oral Q4H PRN Cinderella, Margaret A       cariprazine (VRAYLAR) capsule 3 mg  3 mg  Oral Daily Cinderella, Margaret A   3 mg at 07/26/21 1478   diclofenac Sodium (VOLTAREN) 1 % topical gel 2 g  2 g Topical QID Cinderella, Margaret A   2 g at 07/26/21 0915   [START ON 07/27/2021] escitalopram (LEXAPRO) tablet 15 mg  15 mg Oral Daily Landon Truax, MD       gabapentin (NEURONTIN) capsule 300 mg  300 mg Oral TID Cinderella, Margaret A   300 mg at 07/26/21 1138   hydrOXYzine (ATARAX) tablet 25 mg  25 mg Oral TID PRN Cinderella, Margaret A   25 mg at 07/25/21 1716   ibuprofen (ADVIL) tablet 600 mg  600 mg Oral TID WC PRN Cinderella, Margaret A   600 mg at 07/26/21 1139   magnesium hydroxide (MILK OF MAGNESIA) suspension 30 mL  30 mL Oral Daily PRN Cinderella, Margaret A       melatonin tablet 3 mg  3 mg Oral QHS Cinderella, Margaret A   3 mg at 07/25/21 2116   mometasone-formoterol (DULERA) 200-5 MCG/ACT inhaler 2 puff  2 puff Inhalation BID Cinderella, Margaret A   2 puff at 07/26/21 2956   multivitamin with minerals tablet 1 tablet  1 tablet Oral Daily Cinderella, Margaret A   1 tablet at 07/26/21 0916   pantoprazole (PROTONIX) EC tablet 40 mg  40 mg Oral Daily Cinderella, Margaret A   40 mg at 07/26/21 0916   prazosin (MINIPRESS) capsule 1 mg  1 mg Oral QHS Caden Fatica, MD       propranolol  (INDERAL) tablet 10 mg  10 mg Oral BID Cinderella, Margaret A   10 mg at 07/26/21 2130   QUEtiapine (SEROQUEL) tablet 50 mg  50 mg Oral QHS Cinderella, Margaret A   50 mg at 07/25/21 2116    Lab Results:  Results for orders placed or performed during the hospital encounter of 07/20/21 (from the past 48 hour(s))  SARS CORONAVIRUS 2 (TAT 6-24 HRS) Anterior Nasal Swab     Status: None   Collection Time: 07/24/21  3:15 PM   Specimen: Anterior Nasal Swab  Result Value Ref Range   SARS Coronavirus 2 NEGATIVE NEGATIVE    Comment: (NOTE) SARS-CoV-2 target nucleic acids are NOT DETECTED.  The SARS-CoV-2 RNA is generally detectable in upper and lower respiratory specimens during the acute phase of infection. Negative results do not preclude SARS-CoV-2 infection, do not rule out co-infections with other pathogens, and should not be used as the sole basis for treatment or other patient management decisions. Negative results must be combined with clinical observations, patient history, and epidemiological information. The expected result is Negative.  Fact Sheet for Patients: SugarRoll.be  Fact Sheet for Healthcare Providers: https://www.woods-mathews.com/  This test is not yet approved or cleared by the [place] FDA and  has been authorized for detection and/or diagnosis of SARS-CoV-2 by FDA under an Emergency Use Authorization (EUA). This EUA will remain  in effect (meaning this test can be used) for the duration of the COVID-19 declaration under Se ction 564(b)(1) of the Act, 21 U.S.C. section 360bbb-3(b)(1), unless the authorization is terminated or revoked sooner.  Performed at [facility], [place] [address], [place], [place] [address]     Blood Alcohol level:  Lab Results  Component Value Date   [facility] <10 07/20/2021   ETH <10 46/96/2952    Metabolic Disorder Labs: Lab Results  Component Value Date   HGBA1C 5.1 03/02/2021    MPG 99.67 03/02/2021  MPG 96.8 07/02/2019   Lab Results  Component Value Date   PROLACTIN 66.2 (H) 03/02/2021   PROLACTIN 66.1 (H) 02/04/2020   Lab Results  Component Value Date   CHOL 245 (H) 05/01/2021   TRIG 169 (H) 05/01/2021   HDL 46 05/01/2021   CHOLHDL 5.3 05/01/2021   VLDL 34 05/01/2021   LDLCALC 165 (H) 05/01/2021   LDLCALC 102 (H) 04/24/2021    Physical Findings: AIMS: Facial and Oral Movements Muscles of Facial Expression: None, normal Lips and Perioral Area: None, normal Jaw: None, normal Tongue: None, normal,Extremity Movements Upper (arms, wrists, hands, fingers): None, normal Lower (legs, knees, ankles, toes): None, normal, Trunk Movements Neck, shoulders, hips: None, normal, Overall Severity Severity of abnormal movements (highest score from questions above): None, normal Incapacitation due to abnormal movements: None, normal Patient's awareness of abnormal movements (rate only patient's report): No Awareness, Dental Status Current problems with teeth and/or dentures?: No Does patient usually wear dentures?: No  CIWA:    COWS:     Musculoskeletal: Strength & Muscle Tone: within normal limits Gait & Station: normal Patient leans: N/A  Psychiatric Specialty Exam:  Presentation  General Appearance: Casual  Eye Contact:Fleeting  Speech:Normal Rate  Speech Volume:Normal  Handedness:Ambidextrous   Mood and Affect  Mood:Anxious; Depressed; Dysphoric; Irritable  Affect:Congruent; Full Range   Thought Process  Thought Processes:Linear  Descriptions of Associations:Intact  Orientation:Full (Time, Place and Person)  Thought Content:Logical  History of Schizophrenia/Schizoaffective disorder:No  Duration of Psychotic Symptoms:Less than six months  Hallucinations:Hallucinations: None Description of Command Hallucinations: last had AH 3-4 days ago Description of Auditory Hallucinations: last had VH 3-4 days ago  Ideas of  Reference:None  Suicidal Thoughts:Suicidal Thoughts: Yes, passive, without intent or plan  Homicidal Thoughts:Homicidal Thoughts: No   Sensorium  Memory:Immediate Good; Recent Good; Remote Good  Judgment:Poor  Insight:Poor   Executive Functions  Concentration:Poor  Attention Span:Poor  [place]   Psychomotor Activity  Psychomotor Activity:Psychomotor Activity: Normal   Assets  Assets:Communication Skills; Desire for Improvement   Sleep  Sleep:Sleep: Fair    Physical Exam: Physical Exam Vitals reviewed.  Pulmonary:     Effort: Pulmonary effort is normal.  Neurological:     Mental Status: She is alert.    Review of Systems  Psychiatric/Behavioral:  Positive for depression and suicidal ideas. The patient is nervous/anxious.    Blood pressure (!) 139/119, pulse (!) 107, temperature 98.2 F (36.8 C), temperature source Oral, resp. rate 18, height '5\' 4"'$  (1.626 m), weight 91.2 kg, last menstrual period 06/20/2021, SpO2 99 %. Body mass index is 34.5 kg/m.   Treatment Plan Summary: Daily contact with patient to assess and evaluate symptoms and progress in treatment   ASSESSMENT:   Diagnoses / Active Problems: -Bipolar disorder without psychotic features, current episode is depressed -GAD -History of autism -Borderline personality disorder -History of multiple suicide attempts     PLAN: Safety and Monitoring:             -- Involuntary admission to inpatient psychiatric unit for safety, stabilization and treatment             -- Daily contact with patient to assess and evaluate symptoms and progress in treatment             -- Patient's case to be discussed in multi-disciplinary team meeting             -- Observation Level : q15 minute checks             --  Vital signs:  q12 hours             -- Precautions: suicide, elopement, and assault   2. Psychiatric Diagnoses and Treatment:               -Continue  vraylar 3 mg once daily for bipolar disorder. -continue lexapro 10 mg once daily -Continue gabapentin to 300 mg tid  -Continue propranolol 10 mg twice daily -Increase Lexapro from 10 mg to 15 mg once daily -for mood and anxiety Continue Seroquel 50 mg nightly, this is providing good benefit for the patient for insomnia, anxiety, and mood stabilization, and this was started during the previous psychiatric admission -Continue vistaril 25 mg tid for increased anxiety  -Start prazosin 1 mg nightly for nightmares     --  The risks/benefits/side-effects/alternatives to this medication were discussed in detail with the patient and time was given for questions. The patient consents to medication trial.                -- Metabolic profile and EKG monitoring obtained while on an atypical antipsychotic (BMI: Lipid Panel: HbgA1c: QTc:) 441             -- Encouraged patient to participate in unit milieu and in scheduled group therapies              -- Short Term Goals: Ability to identify changes in lifestyle to reduce recurrence of condition will improve, Ability to verbalize feelings will improve, Ability to disclose and discuss suicidal ideas, Ability to demonstrate self-control will improve, Ability to identify and develop effective coping behaviors will improve, Ability to maintain clinical measurements within normal limits will improve, Compliance with prescribed medications will improve, and Ability to identify triggers associated with substance abuse/mental health issues will improve             -- Long Term Goals: Improvement in symptoms so as ready for discharge                3. Medical Issues Being Addressed:                4. Discharge Planning:              -- Social work and case management to assist with discharge planning and identification of hospital follow-up needs prior to discharge             -- Estimated LOS: 5-7 days             -- Discharge Concerns: Need to establish a safety  plan; Medication compliance and effectiveness             -- Discharge Goals: Return home with outpatient referrals for mental health follow-up including medication management/psychotherapy     Christoper Allegra, MD 07/26/2021, 12:32 PM  Total Time Spent in Direct Patient Care:  I personally spent 35 minutes on the unit in direct patient care. The direct patient care time included face-to-face time with the patient, reviewing the patient's chart, communicating with other professionals, and coordinating care. Greater than 50% of this time was spent in counseling or coordinating care with the patient regarding goals of hospitalization, psycho-education, and discharge planning needs.   Janine Limbo, MD Psychiatrist

## 2021-07-26 NOTE — Group Note (Signed)
Recreation Therapy Group Note   Group Topic:Stress Management  Group Date: 07/26/2021 Start Time: 0935 End Time: 0948 Facilitators: Victorino Sparrow, [place] Location: [address] Dayroom   Goal Area(s) Addresses:  Patient will identify positive stress management techniques. Patient will identify benefits of using stress management post d/c.  Group Description:  [facility] Meditation.  Patients were to relax and listen to the meditation, which focused on taking on all of the characteristics a mountain has to offer.  Patients were to picture themselves weathering what life throws at them without giving up, just as the mountain stands tall no matter goes on around it.    Affect/Mood: N/A   Participation Level: Did not attend    Clinical Observations/Individualized Feedback:     Plan: Continue to engage patient in RT group sessions 2-3x/week.   Victorino Sparrow, LRT,CTRS 07/26/2021 11:56 AM

## 2021-07-27 MED ORDER — QUETIAPINE FUMARATE 25 MG PO TABS
25.0000 mg | ORAL_TABLET | Freq: Once | ORAL | Status: DC
  Filled 2021-07-27: qty 1

## 2021-07-27 MED ORDER — QUETIAPINE FUMARATE 25 MG PO TABS
25.0000 mg | ORAL_TABLET | Freq: Every day | ORAL | Status: DC
  Filled 2021-07-27 (×2): qty 1

## 2021-07-27 MED ORDER — QUETIAPINE FUMARATE 25 MG PO TABS
12.5000 mg | ORAL_TABLET | Freq: Every day | ORAL | Status: DC
  Administered 2021-07-28: 12.5 mg via ORAL
  Filled 2021-07-27 (×3): qty 0.5

## 2021-07-27 MED ORDER — QUETIAPINE FUMARATE 25 MG PO TABS
25.0000 mg | ORAL_TABLET | Freq: Every day | ORAL | Status: DC
Start: 2021-07-27 — End: 2021-07-27
  Administered 2021-07-27: 25 mg via ORAL
  Filled 2021-07-27 (×4): qty 1

## 2021-07-27 NOTE — Progress Notes (Addendum)
[facility] MD Progress Note  07/27/2021 2:34 PM [name]  MRN:  [id number]  Subjective:   Patient is a 31 year old female with a reported past psychiatric history of "intellectual disability, autism, bipolar disorder, depression, anxiety, and borderline personality disorder" who was admitted to the psychiatric hospital from [facility] after suicide attempt via overdose on reported 7 day supply (IM note says 14 day supply?):  Pantoprazole, Vraylar, Lexapro, Seroquel, gabapentin.  Patient was admitted to [facility], and was discharged from the medical floor there, and admitted to the psychiatric hospital.  Yesterday the psychiatry team made the following recommendations: -Continue vraylar 3 mg once daily for bipolar disorder. -continue lexapro 10 mg once daily -Continue gabapentin to 300 mg tid  -Continue propranolol 10 mg twice daily -Increase Lexapro from 10 mg to 15 mg once daily -for mood and anxiety Continue Seroquel 50 mg nightly, this is providing good benefit for the patient for insomnia, anxiety, and mood stabilization, and this was started during the previous psychiatric admission -Continue vistaril 25 mg tid for increased anxiety  -Start prazosin 1 mg nightly for nightmares  Per nursing, patient was very irritable overnight.  She was slamming her head against wall.  On my examination today, we discussed patient's irritability and behaviors overnight.  She reports feeling frustrated at not being discharged.  We discussed that the patient needs to demonstrate impulse control, in order for the team to feel safe to discharge her, in regards to her risk of self-harm. Patient states that she understands this rationale. Overall this morning the patient reports that her mood is less irritable and denies feeling depressed or sad.  She reports that sleep was better, having less nightmares since starting prazosin. Reports that appetite is okay.  Reports concentration is  okay. She denies having any auditory hallucinations.  Denies any suicidal thoughts, which was explored carefully.  Denies HI.  Denies any other psychotic symptoms. Denies side effects to current psychiatric medications. We did discuss starting Seroquel in the morning and the afternoon, for irritability and to help with her impulse control, and the patient is agreeable to this.  Later in the day, patient was reassessed around 1:30 PM, and she states that Seroquel dose in the morning was too sedating.  We discussed changing from 25 mg in the morning and 25 mg at 2 PM, to only 12.5 mg in the morning only, +50 mg nightly.  Patient is agreeable to this.    Principal Problem: Bipolar 1 disorder, depressed, severe ([place]) Diagnosis: Principal Problem:   Bipolar 1 disorder, depressed, severe ([place]) Active Problems:   Borderline personality disorder ([place])   Autism   PTSD (post-traumatic stress disorder)   Tachycardia   Overdose   GAD (generalized anxiety disorder)   Suicidal behavior  Total Time spent with patient: 20 minutes  Past Psychiatric History:  Diagnosis: Reported "intellectual disability, autism, bipolar disorder, depression, anxiety, borderline personality disorder" Patient reports history of multiple psychiatric hospitalizations Patient reports history of multiple suicide attempts Current medications: See above Past psychiatric medication history: " I have taken so many I cannot recall.  But I do recall that Tegretol made blood come out of my mouth".   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 fragile X syndrome    Chronic constipation    Depression  Drug-seeking behavior    Essential tremor    Headache    Overdose of acetaminophen 07/2017   and other meds   Personality disorder (HCC)    Schizo-affective psychosis ([place])    Schizoaffective disorder, bipolar  type ([place])    Seizures ([place])    Last seizure December 2017   Sleep apnea     Past Surgical History:  Procedure Laterality Date   MOUTH SURGERY  2009 or 2010   Family History:  Family History  Problem Relation Age of Onset   Mental illness Father    Asthma Father    PDD Brother    Seizures Brother    Family Psychiatric  History:  Father: Depression.  Brother: Autism.   Patient reports father and 2 uncles attempted suicide.  Social History:  Social History   Substance and Sexual Activity  Alcohol Use No   Alcohol/week: 1.0 standard drink of alcohol   Types: 1 Standard drinks or equivalent per week   Comment: denies at this time     Social History   Substance and Sexual Activity  Drug Use No   Comment: History of cocaine use at age 24 for 4 months    Social History   Socioeconomic History   Marital status: Widowed    Spouse name: Not on file   Number of children: 0   Years of education: Not on file   Highest education level: Not on file  Occupational History   Occupation: disability  Tobacco Use   Smoking status: Former    Packs/day: 0.00    Types: Cigarettes   Smokeless tobacco: Never   Tobacco comments:    Smoked for 2  years age [age]  Vaping Use   Vaping Use: Never used  Substance and Sexual Activity   Alcohol use: No    Alcohol/week: 1.0 standard drink of alcohol    Types: 1 Standard drinks or equivalent per week    Comment: denies at this time   Drug use: No    Comment: History of cocaine use at age 89 for 4 months   Sexual activity: Not Currently    Birth control/protection: None  Other Topics Concern   Not on file  Social History Narrative   Marital status: Widowed      Children: daughter      Lives: with boyfriend, in two story home      Employment:  Disability      Tobacco: quit smoking; smoked for two years.      Alcohol ;none      Drugs: none   Has not traveled outside of the country.   Right handed         Social Determinants of  Health   Financial Resource Strain: Not on file  Food Insecurity: Not on file  Transportation Needs: Not on file  Physical Activity: Not on file  Stress: Not on file  Social Connections: Not on file   Additional Social History:                         Sleep: better   Appetite:  Fair  Current Medications: Current Facility-Administered Medications  Medication Dose Route Frequency Provider Last Rate Last Admin   albuterol (VENTOLIN HFA) 108 (90 Base) MCG/ACT inhaler 2 puff  2 puff Inhalation Q6H PRN Nicholes Rough, NP   2 puff at 07/26/21 1627   alum & mag hydroxide-simeth (MAALOX/MYLANTA) 200-200-20 MG/5ML suspension 30 mL  30 mL Oral  Q4H PRN Cinderella, Margaret A       cariprazine (VRAYLAR) capsule 3 mg  3 mg Oral Daily Cinderella, Margaret A   3 mg at 07/27/21 0750   diclofenac Sodium (VOLTAREN) 1 % topical gel 2 g  2 g Topical QID Cinderella, Margaret A   2 g at 07/27/21 0751   escitalopram (LEXAPRO) tablet 15 mg  15 mg Oral Daily Draycen Leichter, Ovid Curd, MD   15 mg at 07/27/21 0750   gabapentin (NEURONTIN) capsule 300 mg  300 mg Oral TID [name] A   300 mg at 07/27/21 1137   hydrOXYzine (ATARAX) tablet 25 mg  25 mg Oral TID PRN Cinderella, Margaret A   25 mg at 07/27/21 9983   ibuprofen (ADVIL) tablet 600 mg  600 mg Oral TID WC PRN Cinderella, Margaret A   600 mg at 07/26/21 1139   magnesium hydroxide (MILK OF MAGNESIA) suspension 30 mL  30 mL Oral Daily PRN Cinderella, Margaret A       melatonin tablet 3 mg  3 mg Oral QHS Cinderella, Margaret A   3 mg at 07/26/21 2129   mometasone-formoterol (DULERA) 200-5 MCG/ACT inhaler 2 puff  2 puff Inhalation BID Cinderella, Margaret A   2 puff at 07/27/21 3825   multivitamin with minerals tablet 1 tablet  1 tablet Oral Daily Cinderella, Margaret A   1 tablet at 07/27/21 0750   neomycin-bacitracin-polymyxin (NEOSPORIN) ointment   Topical BID Janine Limbo, MD   Given at 07/27/21 0751   pantoprazole (PROTONIX) EC tablet  40 mg  40 mg Oral Daily Cinderella, Margaret A   40 mg at 07/27/21 0750   prazosin (MINIPRESS) capsule 1 mg  1 mg Oral QHS Willi Borowiak, MD   1 mg at 07/26/21 2129   propranolol (INDERAL) tablet 10 mg  10 mg Oral BID Cinderella, Margaret A   10 mg at 07/27/21 0750   [START ON 07/28/2021] QUEtiapine (SEROQUEL) tablet 12.5 mg  12.5 mg Oral Daily Ernie Kasler, MD       QUEtiapine (SEROQUEL) tablet 50 mg  50 mg Oral QHS Cinderella, Margaret A   50 mg at 07/26/21 2129    Lab Results:  No results found for this or any previous visit (from the past 82 hour(s)).   Blood Alcohol level:  Lab Results  Component Value Date   ETH <10 07/20/2021   ETH <10 05/39/7673    Metabolic Disorder Labs: Lab Results  Component Value Date   HGBA1C 5.1 03/02/2021   MPG 99.67 03/02/2021   MPG 96.8 07/02/2019   Lab Results  Component Value Date   PROLACTIN 66.2 (H) 03/02/2021   PROLACTIN 66.1 (H) 02/04/2020   Lab Results  Component Value Date   CHOL 245 (H) 05/01/2021   TRIG 169 (H) 05/01/2021   HDL 46 05/01/2021   CHOLHDL 5.3 05/01/2021   VLDL 34 05/01/2021   LDLCALC 165 (H) 05/01/2021   LDLCALC 102 (H) 04/24/2021    Physical Findings: AIMS: Facial and Oral Movements Muscles of Facial Expression: None, normal Lips and Perioral Area: None, normal Jaw: None, normal Tongue: None, normal,Extremity Movements Upper (arms, wrists, hands, fingers): None, normal Lower (legs, knees, ankles, toes): None, normal, Trunk Movements Neck, shoulders, hips: None, normal, Overall Severity Severity of abnormal movements (highest score from questions above): None, normal Incapacitation due to abnormal movements: None, normal Patient's awareness of abnormal movements (rate only patient's report): No Awareness, Dental Status Current problems with teeth and/or dentures?: No Does patient usually wear dentures?:  No  CIWA:    COWS:     Musculoskeletal: Strength & Muscle Tone: within normal  limits Gait & Station: normal Patient leans: N/A  Psychiatric Specialty Exam:  Presentation  General Appearance: Casual  Eye Contact:Fleeting  Speech:Normal Rate  Speech Volume:Normal  Handedness:Ambidextrous   Mood and Affect  Mood: Less depressed, less anxious  Affect:Congruent; Full Range   Thought Process  Thought Processes:Linear  Descriptions of Associations:Intact  Orientation:Full (Time, Place and Person)  Thought Content:Logical  History of Schizophrenia/Schizoaffective disorder:No  Duration of Psychotic Symptoms:Less than six months  Hallucinations:No data recorded Denies AH, denies VH  Ideas of Reference:None Denies  Suicidal Thoughts:Suicidal Thoughts: Denies  Homicidal Thoughts:No data recorded Denies  Sensorium  Memory:Immediate Good; Recent Good; Remote Good  Judgment:Poor  Insight:Poor   Executive Functions  Concentration:Poor  Attention Span:Poor  [place]   Psychomotor Activity  Psychomotor Activity:No data recorded   Assets  Assets:Communication Skills; Desire for Improvement   Sleep  Sleep:No data recorded Better, 6.75 hours per log   Physical Exam: Physical Exam Vitals reviewed.  Pulmonary:     Effort: Pulmonary effort is normal.  Neurological:     Mental Status: She is alert.     Gait: Gait abnormal.    Review of Systems  Psychiatric/Behavioral:  Negative for depression, hallucinations and suicidal ideas. The patient is nervous/anxious.    Blood pressure (!) 137/112, pulse (!) 105, temperature 98 F (36.7 C), resp. rate 18, height '5\' 4"'$  (1.626 m), weight 91.2 kg, last menstrual period 06/20/2021, SpO2 99 %. Body mass index is 34.5 kg/m.   Treatment Plan Summary: Daily contact with patient to assess and evaluate symptoms and progress in treatment   ASSESSMENT:   Diagnoses / Active Problems: -Bipolar disorder without psychotic features, current episode  is depressed -GAD -History of autism -Borderline personality disorder -History of multiple suicide attempts     PLAN: Safety and Monitoring:             -- Involuntary admission to inpatient psychiatric unit for safety, stabilization and treatment             -- Daily contact with patient to assess and evaluate symptoms and progress in treatment             -- Patient's case to be discussed in multi-disciplinary team meeting             -- Observation Level : q15 minute checks             -- Vital signs:  q12 hours             -- Precautions: suicide, elopement, and assault   2. Psychiatric Diagnoses and Treatment:               -Continue vraylar 3 mg once daily for bipolar disorder. -continue lexapro 10 mg once daily -Continue gabapentin to 300 mg tid  -Continue propranolol 10 mg twice daily -Continue Lexapro 15 mg once daily -for mood and anxiety -Continue Seroquel 50 mg nightly, this is providing good benefit for the patient for insomnia, anxiety, and mood stabilization, and this was started during the previous psychiatric admission -Start Seroquel 12.5 mg in the morning for mood, anxiety, and impulse control (25 mg dose this morning, was too sedating, and dose will be changed to 12.5 mg, starting tomorrow morning) -Continue vistaril 25 mg tid for increased anxiety  -Continue prazosin 1 mg nightly for nightmares   -Education officer, museum to  make DBT referral today   --  The risks/benefits/side-effects/alternatives to this medication were discussed in detail with the patient and time was given for questions. The patient consents to medication trial.                -- Metabolic profile and EKG monitoring obtained while on an atypical antipsychotic (BMI: Lipid Panel: HbgA1c: QTc:) 441             -- Encouraged patient to participate in unit milieu and in scheduled group therapies              -- Short Term Goals: Ability to identify changes in lifestyle to reduce recurrence of condition will  improve, Ability to verbalize feelings will improve, Ability to disclose and discuss suicidal ideas, Ability to demonstrate self-control will improve, Ability to identify and develop effective coping behaviors will improve, Ability to maintain clinical measurements within normal limits will improve, Compliance with prescribed medications will improve, and Ability to identify triggers associated with substance abuse/mental health issues will improve             -- Long Term Goals: Improvement in symptoms so as ready for discharge                3. Medical Issues Being Addressed:                4. Discharge Planning:              -- Social work and case management to assist with discharge planning and identification of hospital follow-up needs prior to discharge             -- Estimated LOS: 5-7 days             -- Discharge Concerns: Need to establish a safety plan; Medication compliance and effectiveness             -- Discharge Goals: Return home with outpatient referrals for mental health follow-up including medication management/psychotherapy     Christoper Allegra, MD 07/27/2021, 2:34 PM  Total Time Spent in Direct Patient Care:  I personally spent 35 minutes on the unit in direct patient care. The direct patient care time included face-to-face time with the patient, reviewing the patient's chart, communicating with other professionals, and coordinating care. Greater than 50% of this time was spent in counseling or coordinating care with the patient regarding goals of hospitalization, psycho-education, and discharge planning needs.   Janine Limbo, MD Psychiatrist

## 2021-07-27 NOTE — [facility] Group Notes (Signed)
Adult Relaxation Group Note  Date:  07/27/2021 Time:  2:29 PM  Pt did not attend Relaxation group.   Kern Reap 07/27/2021, 2:29 PM

## 2021-07-27 NOTE — Progress Notes (Signed)
[facility] Group Notes:  (Nursing/MHT/Case Management/Adjunct)  Date:  07/27/2021  Time:  2020  Type of Therapy:   wrap up group  Participation Level:  Active  Participation Quality:  Appropriate, Attentive, Sharing, and Supportive  Affect:  Anxious and Excited  Cognitive:  Lacking  Insight:  Improving  Engagement in Group:  Engaged  Modes of Intervention:  Clarification, Education, and Support  Summary of Progress/Problems: Positive thinking and positive change were discussed.   Winfield Rast S 07/27/2021, 9:15 PM

## 2021-07-27 NOTE — Progress Notes (Signed)
Pt denies SI/HI/AVH and verbally agrees to approach staff if these become apparent or before harming themselves/others. Rates depression 0/10. Rates anxiety 0/10. Rates pain 0/10.  Pt has been in her room for the majority of the day. Pt has not banged her head on the wall and not had any outbursts. Pt did have a heated conversation with boyfriend on the phone and the boyfriend ended up calling the unit stating that she was talking crazy. Pt came up later and said how she has been today is how she is normally. Pt was slightly frustrated that she may not go home because of it but RN reassured pt to continue to have good behavior. Pt has been okay since. Scheduled medications administered to pt, per MD orders. RN provided support and encouragement to pt. Q15 min safety checks implemented and continued. Pt safe on the unit. RN will continue to monitor and intervene as needed.   07/27/21 0819  Psych Admission Type (Psych Patients Only)  Admission Status Involuntary  Psychosocial Assessment  Patient Complaints Anxiety;Agitation;Irritability  Eye Contact Fair  Facial Expression Animated;Anxious  Affect Labile;Anxious  Speech Pressured;Rapid;Soft  Interaction Needy;Childlike  Motor Activity Restless  Appearance/Hygiene Unremarkable  Behavior Characteristics Fidgety;Restless;Anxious;Cooperative  Mood Anxious;Labile  Thought Process  Coherency Tangential  Delusions None reported or observed  Perception Hallucinations  Hallucination None reported or observed  Judgment Poor  Confusion None  Danger to Self  Current suicidal ideation? Denies  Danger to Others  Danger to Others None reported or observed  Danger to Others Abnormal  Harmful Behavior to others No threats or harm toward other people  Destructive Behavior No threats or harm toward property

## 2021-07-27 NOTE — Progress Notes (Signed)
   07/26/21 2300  Psych Admission Type (Psych Patients Only)  Admission Status Involuntary  Psychosocial Assessment  Patient Complaints Anxiety;Irritability;Nervousness;Crying spells  Eye Contact Fair  Facial Expression Anxious  Affect Labile;Depressed  Speech Pressured;Rapid  Interaction Needy  Motor Activity Pacing  Appearance/Hygiene Unremarkable  Behavior Characteristics Fidgety  Mood Labile;Anxious  Thought Process  Coherency Tangential  Content WDL  Delusions None reported or observed  Perception Illusions  Hallucination None reported or observed  Judgment Poor  Confusion None  Danger to Self  Current suicidal ideation? Denies  Self-Injurious Behavior No self-injurious ideation or behavior indicators observed or expressed   Agreement Not to Harm Self Yes  Description of Agreement verbal  Danger to Others  Danger to Others None reported or observed  Danger to Others Abnormal  Harmful Behavior to others No threats or harm toward other people  Destructive Behavior No threats or harm toward property

## 2021-07-28 DIAGNOSIS — T1491XA Suicide attempt, initial encounter: Secondary | ICD-10-CM

## 2021-07-28 MED ORDER — PRAZOSIN HCL 1 MG PO CAPS: 1.0000 mg | ORAL_CAPSULE | Freq: Every day | ORAL | 0 refills | Status: DC

## 2021-07-28 MED ORDER — ESCITALOPRAM OXALATE 5 MG PO TABS: 5.0000 mg | ORAL_TABLET | Freq: Every day | ORAL | 0 refills | Status: DC

## 2021-07-28 MED ORDER — QUETIAPINE FUMARATE 25 MG PO TABS
12.5000 mg | ORAL_TABLET | Freq: Every day | ORAL | 0 refills | Status: DC
Start: 2021-07-29 — End: 2021-08-03

## 2021-07-28 MED ORDER — BACITRACIN-NEOMYCIN-POLYMYXIN OINTMENT TUBE: 1.0000 | TOPICAL_OINTMENT | Freq: Two times a day (BID) | CUTANEOUS | Status: DC

## 2021-07-29 ENCOUNTER — Encounter (HOSPITAL_COMMUNITY): Payer: Self-pay

## 2021-07-29 ENCOUNTER — Other Ambulatory Visit: Payer: Self-pay

## 2021-07-29 ENCOUNTER — Emergency Department (HOSPITAL_COMMUNITY)
Admission: EM | Admit: 2021-07-29 | Discharge: 2021-07-30 | Disposition: A | Discharge status: Other Acute Inpatient Hospital | Payer: PPO | Attending: Emergency Medicine | Admitting: Emergency Medicine

## 2021-07-29 DIAGNOSIS — F419 Anxiety disorder, unspecified: Secondary | ICD-10-CM | POA: Insufficient documentation

## 2021-07-29 DIAGNOSIS — J45909 Unspecified asthma, uncomplicated: Secondary | ICD-10-CM | POA: Diagnosis not present

## 2021-07-29 DIAGNOSIS — X789XXA Intentional self-harm by unspecified sharp object, initial encounter: Secondary | ICD-10-CM | POA: Insufficient documentation

## 2021-07-29 DIAGNOSIS — Z79899 Other long term (current) drug therapy: Secondary | ICD-10-CM | POA: Insufficient documentation

## 2021-07-29 DIAGNOSIS — S1191XA Laceration without foreign body of unspecified part of neck, initial encounter: Secondary | ICD-10-CM | POA: Diagnosis not present

## 2021-07-29 DIAGNOSIS — T1491XA Suicide attempt, initial encounter: Secondary | ICD-10-CM | POA: Diagnosis not present

## 2021-07-29 DIAGNOSIS — F840 Autistic disorder: Secondary | ICD-10-CM | POA: Insufficient documentation

## 2021-07-29 DIAGNOSIS — Z20822 Contact with and (suspected) exposure to covid-19: Secondary | ICD-10-CM | POA: Diagnosis not present

## 2021-07-29 DIAGNOSIS — Z7951 Long term (current) use of inhaled steroids: Secondary | ICD-10-CM | POA: Diagnosis not present

## 2021-07-29 DIAGNOSIS — I959 Hypotension, unspecified: Secondary | ICD-10-CM | POA: Diagnosis not present

## 2021-07-29 DIAGNOSIS — F314 Bipolar disorder, current episode depressed, severe, without psychotic features: Secondary | ICD-10-CM | POA: Diagnosis not present

## 2021-07-29 DIAGNOSIS — S51812A Laceration without foreign body of left forearm, initial encounter: Secondary | ICD-10-CM | POA: Diagnosis not present

## 2021-07-29 DIAGNOSIS — T50912A Poisoning by multiple unspecified drugs, medicaments and biological substances, intentional self-harm, initial encounter: Secondary | ICD-10-CM | POA: Diagnosis not present

## 2021-07-29 DIAGNOSIS — Y900 Blood alcohol level of less than 20 mg/100 ml: Secondary | ICD-10-CM | POA: Diagnosis not present

## 2021-07-29 DIAGNOSIS — D649 Anemia, unspecified: Secondary | ICD-10-CM | POA: Insufficient documentation

## 2021-07-29 DIAGNOSIS — R9431 Abnormal electrocardiogram [ECG] [EKG]: Secondary | ICD-10-CM | POA: Diagnosis not present

## 2021-07-29 DIAGNOSIS — Z046 Encounter for general psychiatric examination, requested by authority: Secondary | ICD-10-CM | POA: Diagnosis present

## 2021-07-29 LAB — CBC WITH DIFFERENTIAL/PLATELET
Abs Immature Granulocytes: 0.02 10*3/uL (ref 0.00–0.07)
Basophils Absolute: 0 10*3/uL (ref 0.0–0.1)
Basophils Relative: 0 %
Eosinophils Absolute: 0.1 10*3/uL (ref 0.0–0.5)
Eosinophils Relative: 1 %
HCT: 34.7 % — ABNORMAL LOW (ref 36.0–46.0)
Hemoglobin: 10.8 g/dL — ABNORMAL LOW (ref 12.0–15.0)
Immature Granulocytes: 0 %
Lymphocytes Relative: 33 %
Lymphs Abs: 2.2 10*3/uL (ref 0.7–4.0)
MCH: 26.2 pg (ref 26.0–34.0)
MCHC: 31.1 g/dL (ref 30.0–36.0)
MCV: 84 fL (ref 80.0–100.0)
Monocytes Absolute: 0.3 10*3/uL (ref 0.1–1.0)
Monocytes Relative: 5 %
Neutro Abs: 4.1 10*3/uL (ref 1.7–7.7)
Neutrophils Relative %: 61 %
Platelets: 236 10*3/uL (ref 150–400)
RBC: 4.13 MIL/uL (ref 3.87–5.11)
RDW: 14.3 % (ref 11.5–15.5)
WBC: 6.7 10*3/uL (ref 4.0–10.5)
nRBC: 0 % (ref 0.0–0.2)

## 2021-07-29 LAB — URINALYSIS, ROUTINE W REFLEX MICROSCOPIC
Bilirubin Urine: NEGATIVE
Glucose, UA: NEGATIVE mg/dL
Hgb urine dipstick: NEGATIVE
Ketones, ur: NEGATIVE mg/dL
Nitrite: NEGATIVE
Protein, ur: NEGATIVE mg/dL
Specific Gravity, Urine: 1.005 (ref 1.005–1.030)
pH: 6 (ref 5.0–8.0)

## 2021-07-29 LAB — CBG MONITORING, ED: Glucose-Capillary: 90 mg/dL (ref 70–99)

## 2021-07-29 LAB — COMPREHENSIVE METABOLIC PANEL
ALT: 18 U/L (ref 0–44)
AST: 17 U/L (ref 15–41)
Albumin: 3.3 g/dL — ABNORMAL LOW (ref 3.5–5.0)
Alkaline Phosphatase: 57 U/L (ref 38–126)
Anion gap: 10 (ref 5–15)
BUN: 10 mg/dL (ref 6–20)
CO2: 18 mmol/L — ABNORMAL LOW (ref 22–32)
Calcium: 8.4 mg/dL — ABNORMAL LOW (ref 8.9–10.3)
Chloride: 112 mmol/L — ABNORMAL HIGH (ref 98–111)
Creatinine, Ser: 0.68 mg/dL (ref 0.44–1.00)
GFR, Estimated: >60 mL/min (ref >60)
Glucose, Bld: 94 mg/dL (ref 70–99)
Potassium: 3.9 mmol/L (ref 3.5–5.1)
Sodium: 140 mmol/L (ref 135–145)
Total Bilirubin: 0.4 mg/dL (ref 0.3–1.2)
Total Protein: 5.8 g/dL — ABNORMAL LOW (ref 6.5–8.1)

## 2021-07-29 LAB — RAPID URINE DRUG SCREEN, HOSP PERFORMED
Amphetamines: NOT DETECTED
Barbiturates: NOT DETECTED
Benzodiazepines: NOT DETECTED
Cocaine: NOT DETECTED
Opiates: NOT DETECTED
Tetrahydrocannabinol: NOT DETECTED

## 2021-07-29 LAB — ETHANOL: Alcohol, Ethyl (B): <10 mg/dL (ref <10)

## 2021-07-29 LAB — ACETAMINOPHEN LEVEL: Acetaminophen (Tylenol), Serum: <10 ug/mL — ABNORMAL LOW (ref 10–30)

## 2021-07-29 LAB — SALICYLATE LEVEL: Salicylate Lvl: <7 mg/dL — ABNORMAL LOW (ref 7.0–30.0)

## 2021-07-29 MED ORDER — HYDROXYZINE HCL 25 MG PO TABS: 25.0000 mg | ORAL_TABLET | Freq: Three times a day (TID) | ORAL | Status: DC | PRN

## 2021-07-29 MED ORDER — SODIUM CHLORIDE 0.9 % IV SOLN: INTRAVENOUS | Status: DC

## 2021-07-29 MED ORDER — PROPRANOLOL HCL 10 MG PO TABS
10.0000 mg | ORAL_TABLET | Freq: Two times a day (BID) | ORAL | Status: DC
  Filled 2021-07-29 (×2): qty 1

## 2021-07-29 MED ORDER — QUETIAPINE 12.5 MG HALF TABLET
12.5000 mg | ORAL_TABLET | Freq: Every day | ORAL | Status: DC
  Filled 2021-07-29: qty 1

## 2021-07-29 MED ORDER — IBUPROFEN 400 MG PO TABS: 600.0000 mg | ORAL_TABLET | Freq: Three times a day (TID) | ORAL | Status: DC | PRN

## 2021-07-29 MED ORDER — MELATONIN 3 MG PO TABS
3.0000 mg | ORAL_TABLET | Freq: Every day | ORAL | Status: DC
  Administered 2021-07-29: 3 mg via ORAL
  Filled 2021-07-29: qty 1

## 2021-07-29 MED ORDER — SODIUM CHLORIDE 0.9 % IV BOLUS
1000.0000 mL | Freq: Once | INTRAVENOUS | Status: AC
  Administered 2021-07-29: 1000 mL via INTRAVENOUS

## 2021-07-29 MED ORDER — PANTOPRAZOLE SODIUM 40 MG PO TBEC: 40.0000 mg | DELAYED_RELEASE_TABLET | Freq: Every day | ORAL | Status: DC

## 2021-07-29 MED ORDER — QUETIAPINE FUMARATE 50 MG PO TABS
50.0000 mg | ORAL_TABLET | Freq: Every day | ORAL | Status: DC
  Administered 2021-07-29: 50 mg via ORAL
  Filled 2021-07-29: qty 2

## 2021-07-29 MED ORDER — CALCIUM CARBONATE ANTACID 500 MG PO CHEW: 1500.0000 mg | CHEWABLE_TABLET | Freq: Two times a day (BID) | ORAL | Status: DC | PRN

## 2021-07-29 MED ORDER — MELATONIN 3 MG PO TABS: 3.0000 mg | ORAL_TABLET | Freq: Every day | ORAL | Status: DC

## 2021-07-29 MED ORDER — PRAZOSIN HCL 1 MG PO CAPS
1.0000 mg | ORAL_CAPSULE | Freq: Every day | ORAL | Status: DC
  Filled 2021-07-29: qty 1

## 2021-07-29 MED ORDER — ALBUTEROL SULFATE HFA 108 (90 BASE) MCG/ACT IN AERS: 2.0000 | INHALATION_SPRAY | Freq: Four times a day (QID) | RESPIRATORY_TRACT | Status: DC | PRN

## 2021-07-29 MED ORDER — GABAPENTIN 300 MG PO CAPS: 300.0000 mg | ORAL_CAPSULE | Freq: Three times a day (TID) | ORAL | Status: DC

## 2021-07-29 MED ORDER — IBUPROFEN 400 MG PO TABS
600.0000 mg | ORAL_TABLET | Freq: Once | ORAL | Status: AC
  Administered 2021-07-29: 600 mg via ORAL
  Filled 2021-07-29: qty 1

## 2021-07-29 MED ORDER — ONDANSETRON HCL 4 MG PO TABS
4.0000 mg | ORAL_TABLET | Freq: Three times a day (TID) | ORAL | Status: DC | PRN
Start: 2021-07-29 — End: 2021-07-30

## 2021-07-29 MED ORDER — MOMETASONE FURO-FORMOTEROL FUM 200-5 MCG/ACT IN AERO
2.0000 | INHALATION_SPRAY | Freq: Two times a day (BID) | RESPIRATORY_TRACT | Status: DC
  Filled 2021-07-29: qty 8.8

## 2021-07-29 MED ORDER — CARIPRAZINE HCL 1.5 MG PO CAPS
3.0000 mg | ORAL_CAPSULE | Freq: Every day | ORAL | Status: DC
  Filled 2021-07-29: qty 2

## 2021-07-29 MED ORDER — ONDANSETRON HCL 4 MG/2ML IJ SOLN
4.0000 mg | Freq: Four times a day (QID) | INTRAMUSCULAR | Status: DC | PRN
  Administered 2021-07-29: 4 mg via INTRAVENOUS
  Filled 2021-07-29: qty 2

## 2021-07-29 MED ORDER — LORAZEPAM 2 MG/ML IJ SOLN: 2.0000 mg | Freq: Four times a day (QID) | INTRAMUSCULAR | Status: DC | PRN

## 2021-07-29 MED ORDER — ESCITALOPRAM OXALATE 10 MG PO TABS: 10.0000 mg | ORAL_TABLET | Freq: Every day | ORAL | Status: DC

## 2021-07-29 NOTE — ED Triage Notes (Signed)
Pt coming form home via [facility]. Pt called EMS after attempting suicide. Pt reports having some anxiety today. Pt reports cutting both arms and neck. Pt states she took a weeks worth of Valspar, Propanolol, and risperdal. Pt is A/O and cooperative at this time.   BP 100/50 HR 70 RR 17

## 2021-07-29 NOTE — ED Notes (Signed)
Staffing called for sitter, none available at this time. Charge made aware, no staff available to sit with patient at this time. Pt switched from room 35 to 34 closer to nurses station with door/curtain left open for visualization by staff. Pt currently calm and cooperative, reporting 8/10 headache. PA notified and order for Ibuprofen received. Belongings removed from room, but pt allowed to keep soft cover book at this time.

## 2021-07-29 NOTE — ED Notes (Signed)
Poison control contacted. Recommendations as follows: Monitor blood pressure and heart rate, and seizure activity.  Fluids to maintain bp, Benzo's can be given for seizure activity, anti-emetics for N/V, can start pressors if fluids isn't keeping blood pressure up. If pressors isn't working to keep up bp do an echo to check cardiac function. If pt has persistent hypotension check a BNP and a lactate.

## 2021-07-30 ENCOUNTER — Ambulatory Visit (INDEPENDENT_AMBULATORY_CARE_PROVIDER_SITE_OTHER)
Admission: EM | Admit: 2021-07-30 | Discharge: 2021-07-31 | Disposition: A | Discharge status: Home / Self Care | Payer: PPO | Source: Home / Self Care

## 2021-07-30 DIAGNOSIS — F419 Anxiety disorder, unspecified: Secondary | ICD-10-CM | POA: Insufficient documentation

## 2021-07-30 DIAGNOSIS — T1491XA Suicide attempt, initial encounter: Secondary | ICD-10-CM | POA: Insufficient documentation

## 2021-07-30 DIAGNOSIS — F840 Autistic disorder: Secondary | ICD-10-CM | POA: Insufficient documentation

## 2021-07-30 DIAGNOSIS — X58XXXA Exposure to other specified factors, initial encounter: Secondary | ICD-10-CM | POA: Insufficient documentation

## 2021-07-30 LAB — RESP PANEL BY RT-PCR (FLU A&B, COVID) ARPGX2
Influenza A by PCR: NEGATIVE
Influenza B by PCR: NEGATIVE
SARS Coronavirus 2 by RT PCR: NEGATIVE

## 2021-07-30 LAB — POCT PREGNANCY, URINE: Preg Test, Ur: NEGATIVE

## 2021-07-30 MED ORDER — HYDROXYZINE HCL 25 MG PO TABS: 25.0000 mg | ORAL_TABLET | Freq: Three times a day (TID) | ORAL | Status: DC | PRN

## 2021-07-30 MED ORDER — DIPHENHYDRAMINE HCL 50 MG/ML IJ SOLN: 50.0000 mg | Freq: Two times a day (BID) | INTRAMUSCULAR | Status: DC | PRN

## 2021-07-30 MED ORDER — ESCITALOPRAM OXALATE 5 MG PO TABS
15.0000 mg | ORAL_TABLET | Freq: Every day | ORAL | Status: DC
  Administered 2021-07-30 – 2021-07-31 (×2): 15 mg via ORAL
  Filled 2021-07-30 (×2): qty 3

## 2021-07-30 MED ORDER — IBUPROFEN 600 MG PO TABS
600.0000 mg | ORAL_TABLET | Freq: Four times a day (QID) | ORAL | Status: DC | PRN
  Administered 2021-07-30: 600 mg via ORAL
  Filled 2021-07-30: qty 1

## 2021-07-30 MED ORDER — ESCITALOPRAM OXALATE 10 MG PO TABS: 10.0000 mg | ORAL_TABLET | Freq: Every day | ORAL | Status: DC

## 2021-07-30 MED ORDER — MAGNESIUM HYDROXIDE 400 MG/5ML PO SUSP: 30.0000 mL | Freq: Every day | ORAL | Status: DC | PRN

## 2021-07-30 MED ORDER — OLANZAPINE 10 MG IM SOLR: 10.0000 mg | Freq: Two times a day (BID) | INTRAMUSCULAR | Status: DC | PRN

## 2021-07-30 MED ORDER — GABAPENTIN 300 MG PO CAPS
300.0000 mg | ORAL_CAPSULE | Freq: Three times a day (TID) | ORAL | Status: DC
  Administered 2021-07-30 – 2021-07-31 (×4): 300 mg via ORAL
  Filled 2021-07-30 (×4): qty 1

## 2021-07-30 MED ORDER — MELATONIN 3 MG PO TABS
3.0000 mg | ORAL_TABLET | Freq: Every day | ORAL | Status: DC
  Administered 2021-07-30: 3 mg via ORAL
  Filled 2021-07-30: qty 1

## 2021-07-30 MED ORDER — ALUM & MAG HYDROXIDE-SIMETH 200-200-20 MG/5ML PO SUSP: 30.0000 mL | ORAL | Status: DC | PRN

## 2021-07-30 MED ORDER — LORAZEPAM 1 MG PO TABS
1.0000 mg | ORAL_TABLET | Freq: Four times a day (QID) | ORAL | Status: DC | PRN
  Administered 2021-07-30: 1 mg via ORAL
  Filled 2021-07-30 (×2): qty 1

## 2021-07-30 MED ORDER — QUETIAPINE FUMARATE 50 MG PO TABS
50.0000 mg | ORAL_TABLET | Freq: Every day | ORAL | Status: DC
  Administered 2021-07-30: 50 mg via ORAL
  Filled 2021-07-30: qty 1

## 2021-07-30 MED ORDER — DICLOFENAC SODIUM 1 % EX GEL: 2.0000 g | Freq: Three times a day (TID) | CUTANEOUS | Status: DC | PRN

## 2021-07-30 MED ORDER — ALBUTEROL SULFATE HFA 108 (90 BASE) MCG/ACT IN AERS: 2.0000 | INHALATION_SPRAY | Freq: Four times a day (QID) | RESPIRATORY_TRACT | Status: DC | PRN

## 2021-07-30 MED ORDER — QUETIAPINE FUMARATE 25 MG PO TABS
12.5000 mg | ORAL_TABLET | Freq: Every day | ORAL | Status: DC
  Administered 2021-07-31: 12.5 mg via ORAL
  Filled 2021-07-30 (×3): qty 1

## 2021-07-30 NOTE — ED Notes (Addendum)
Upon speaking with pt she states she is here due to an overdose attempt, because her ex is going out of town for 5 days and she has never been with out him for that long. The patient also stated that her brother is in school to get his bachelors and all she has is 3 associates degree and that also caused the SI trigger for her. Pt also stated that he ex would like to speak with myself and the doctor to tell us what is going on with her. The patients ex name is Fayrene Fearing [phone]. The patient states her ex feels that she is not safe to be at home alone while he is gone.

## 2021-07-30 NOTE — ED Notes (Signed)
Pt given medications as ordered.  She is currently in bed in no distress.

## 2021-07-30 NOTE — ED Notes (Signed)
Pt resting quietly in bed.  She has been given a snack and is in no distress.  Will continue to monitor for safety.

## 2021-07-30 NOTE — ED Notes (Signed)
This nurse reached out to the provider to make her aware of pts concerns, pt has been notified as well .

## 2021-07-30 NOTE — ED Notes (Signed)
Pt resting in bed watching TV. A&O x4, calm and cooperative. Denies current SI/HI/AVH. Sandwich given per pt request. No signs of acute distress noted. Will continue to monitor for safety.

## 2021-07-31 DIAGNOSIS — T1491XA Suicide attempt, initial encounter: Secondary | ICD-10-CM

## 2021-07-31 MED ORDER — PRAZOSIN HCL 1 MG PO CAPS
1.0000 mg | ORAL_CAPSULE | Freq: Every day | ORAL | Status: DC
  Filled 2021-07-31: qty 2

## 2021-07-31 MED ORDER — CARIPRAZINE HCL 3 MG PO CAPS
3.0000 mg | ORAL_CAPSULE | Freq: Every day | ORAL | Status: DC
Start: 2021-08-01 — End: 2021-07-31
  Filled 2021-07-31: qty 2

## 2021-07-31 MED ORDER — PANTOPRAZOLE SODIUM 40 MG PO TBEC
40.0000 mg | DELAYED_RELEASE_TABLET | Freq: Every day | ORAL | Status: DC
  Administered 2021-07-31: 40 mg via ORAL
  Filled 2021-07-31: qty 1

## 2021-07-31 NOTE — ED Provider Notes (Signed)
FBC/OBS ASAP Discharge Summary  Date and Time: 07/31/2021 1:11 PM  Name: [name]  MRN:  [id number]   Discharge Diagnoses:  Final diagnoses:  Suicide attempt [facility])    Subjective: [name] 31 y.o., female patient presented to [facility] as a transfer from [facility] after a reported suicide attempt. She was admitted to the continuous assessment unit.   [name], 31 y.o., female patient seen face to face by this provider, consulted with Dr. Bronwen Betters; and chart reviewed on 07/31/21.  Per chart review patient has a psychiatric history of Depression, Personality Disorder, Schizoaffective, Bipolar, Anxiety, Suicide attempts, seizures, Asthma, Autism, and GERD.  Patient is well-known to the psychiatry service.  She was recently admitted to [facility] from 6/19/2 3 to 07/29/21.   [name] reports she is feeling better and was able to sleep last night. Reports she is no longer feeling suicidal. When asked what has changed overnight she states "I don't know I got some sleep and I just feel better". Reports she no longer feels depressed.  She is contracting for safety.  Discussed patient's recent suicide attempt and she states, "yeah I already regret doing that I feel silly".  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  She is looking forward to getting paid on Friday.  She appears excited and smiles throughout the assessment.  Reports she has talked to her female friend and he continues to be out of town.  Patient will return to her home after discharge.  States she has an appointment with her psychiatrist with her ACTT team on Wednesday.  She is also followed by a community support team and states she will call her support person [name] when she is discharged.  She has an appointment today at 1:00 with her medical provider.  Patient receives her medicines in blister packs weekly by mail.  She is only provided 1  weeks worth at a time.  Reports she has all of her medications except for Vraylar and Minipress.  This appears to be contradictory to reports that she took her whole weeks worth of blister packs as of a suicide attempt on admission.  She will be provided 2 doses of Minipress 1 mg and Vraylar 3 mg as a bridge until she can meet with her Psychiatrist on Wednesday.  Extensively discussed suicide prevention/intervention planning with patient.  Discussed coping skills and impulsivity.  She verbalizes understanding and agrees to return to the emergency room or [facility] if she begins to feel suicidal again.  During evaluation [name] is in sitting position no acute distress. She is alert, oriented x 4, calm, cooperative and attentive.  Her mood is euthymic with congruent affect.  She has normal speech, and behavior.  Objectively there is no evidence of psychosis/mania or delusional thinking.  Patient is able to converse coherently, goal directed thoughts, no distractibility, or pre-occupation.  She also denies HI/AVH, psychosis, and paranoia.     [facility] [phone] - unable to reach.   Stay Summary:   [name] was admitted to [facility] Continuous Assessment unit for SI, suicide attempt and crisis management. Her improvement was monitored by continuous assessment/observation and her report of symptom reduction.  Her emotional and mental status was also monitored by staff. She is requesting to be discharged. Patients IVC will be rescinded and she will be discharged.    Evelyn Lo  Ward will follow up with the services as listed below under Follow up Information.  Upon completion of this admission the [name] was both mentally and medically stable for discharge denying suicidal/homicidal ideation, auditory/visual/tactile hallucinations, delusional thoughts and paranoia.     Total Time spent with patient: 30 minutes  Past Psychiatric History: See H&P Past  Medical History:  Past Medical History:  Diagnosis Date   Acid reflux    Anxiety    Asthma    last attack 03/13/15 or 03/14/15   Autism    Carrier of fragile X syndrome    Chronic constipation    Depression    Drug-seeking behavior    Essential tremor    Headache    Overdose of acetaminophen 07/2017   and other meds   Personality disorder (HCC)    Schizo-affective psychosis (HCC)    Schizoaffective disorder, bipolar type (HCC)    Seizures (HCC)    Last seizure December 2017   Sleep apnea     Past Surgical History:  Procedure Laterality Date   MOUTH SURGERY  2009 or 2010   Family History:  Family History  Problem Relation Age of Onset   Mental illness Father    Asthma Father    PDD Brother    Seizures Brother    Family Psychiatric History: See H&P Social History:  Social History   Substance and Sexual Activity  Alcohol Use No   Alcohol/week: 1.0 standard drink of alcohol   Types: 1 Standard drinks or equivalent per week   Comment: denies at this time     Social History   Substance and Sexual Activity  Drug Use No   Comment: History of cocaine use at age 30 for 4 months    Social History   Socioeconomic History   Marital status: Widowed    Spouse name: Not on file   Number of children: 0   Years of education: Not on file   Highest education level: Not on file  Occupational History   Occupation: disability  Tobacco Use   Smoking status: Former    Packs/day: 0.00    Types: Cigarettes   Smokeless tobacco: Never   Tobacco comments:    Smoked for 2  years age [age]  Vaping Use   Vaping Use: Never used  Substance and Sexual Activity   Alcohol use: No    Alcohol/week: 1.0 standard drink of alcohol    Types: 1 Standard drinks or equivalent per week    Comment: denies at this time   Drug use: No    Comment: History of cocaine use at age 35 for 4 months   Sexual activity: Not Currently    Birth control/protection: None  Other Topics Concern   Not on  file  Social History Narrative   Marital status: Widowed      Children: daughter      Lives: with boyfriend, in two story home      Employment:  Disability      Tobacco: quit smoking; smoked for two years.      Alcohol ;none      Drugs: none   Has not traveled outside of the country.   Right handed         Social Determinants of Health   Financial Resource Strain: Not on file  Food Insecurity: Not on file  Transportation Needs: Not on file  Physical Activity: Not on file  Stress: Not on file  Social Connections: Not on file  SDOH:  SDOH Screenings   Alcohol Screen: Low Risk  (07/25/2021)   Alcohol Screen    Last Alcohol Screening Score (AUDIT): 0  Depression (PHQ2-9): Medium Risk (06/27/2021)   Depression (PHQ2-9)    PHQ-2 Score: 9  Financial Resource Strain: Not on file  Food Insecurity: Not on file  Housing: Not on file  Physical Activity: Not on file  Social Connections: Not on file  Stress: Not on file  Tobacco Use: Medium Risk (07/29/2021)   Patient History    Smoking Tobacco Use: Former    Smokeless Tobacco Use: Never    Passive Exposure: Not on file  Transportation Needs: Not on file    Tobacco Cessation:  N/A, patient does not currently use tobacco products  Current Medications:  Current Facility-Administered Medications  Medication Dose Route Frequency Provider Last Rate Last Admin   albuterol (VENTOLIN HFA) 108 (90 Base) MCG/ACT inhaler 2 puff  2 puff Inhalation Q6H PRN White, Patrice L, NP       alum & mag hydroxide-simeth (MAALOX/MYLANTA) 200-200-20 MG/5ML suspension 30 mL  30 mL Oral Q4H PRN White, Patrice L, NP       [START ON 08/01/2021] cariprazine (VRAYLAR) capsule 3 mg  3 mg Oral Daily Vernard Gambles H, NP       diclofenac Sodium (VOLTAREN) 1 % topical gel 2 g  2 g Topical TID PRN White, Patrice L, NP       diphenhydrAMINE (BENADRYL) injection 50 mg  50 mg Intramuscular Q12H PRN White, Patrice L, NP       escitalopram (LEXAPRO) tablet 15 mg  15  mg Oral Daily White, Patrice L, NP   15 mg at 07/31/21 1000   gabapentin (NEURONTIN) capsule 300 mg  300 mg Oral TID White, Patrice L, NP   300 mg at 07/31/21 1000   hydrOXYzine (ATARAX) tablet 25 mg  25 mg Oral TID PRN White, Patrice L, NP       ibuprofen (ADVIL) tablet 600 mg  600 mg Oral Q6H PRN White, Patrice L, NP   600 mg at 07/30/21 1537   LORazepam (ATIVAN) tablet 1 mg  1 mg Oral Q6H PRN White, Patrice L, NP   1 mg at 07/30/21 1057   magnesium hydroxide (MILK OF MAGNESIA) suspension 30 mL  30 mL Oral Daily PRN White, Patrice L, NP       melatonin tablet 3 mg  3 mg Oral QHS [place], Ene A, NP   3 mg at 07/30/21 2148   OLANZapine (ZYPREXA) injection 10 mg  10 mg Intramuscular Q12H PRN White, Patrice L, NP       pantoprazole (PROTONIX) EC tablet 40 mg  40 mg Oral Daily Vernard Gambles H, NP   40 mg at 07/31/21 1046   [START ON 08/01/2021] prazosin (MINIPRESS) capsule 1 mg  1 mg Oral QHS Ardis Hughs, NP       QUEtiapine (SEROQUEL) tablet 12.5 mg  12.5 mg Oral Daily White, Patrice L, NP   12.5 mg at 07/31/21 1000   QUEtiapine (SEROQUEL) tablet 50 mg  50 mg Oral QHS White, Patrice L, NP   50 mg at 07/30/21 2123   Current Outpatient Medications  Medication Sig Dispense Refill   albuterol (VENTOLIN HFA) 108 (90 Base) MCG/ACT inhaler Inhale 2 puffs into the lungs every 6 (six) hours as needed for wheezing or shortness of breath.     BIOTIN PO Take 3 capsules by mouth daily.     budesonide-formoterol (SYMBICORT) 160-4.5 MCG/ACT  inhaler Inhale 2 puffs into the lungs 2 (two) times daily.     calcium carbonate (TUMS EX) 750 MG chewable tablet Chew 1,500 mg by mouth 2 (two) times daily as needed for heartburn.     cariprazine (VRAYLAR) 3 MG capsule Take 1 capsule (3 mg total) by mouth daily. 30 capsule    Cyanocobalamin (B-12 PO) Take 1 tablet by mouth daily.     diclofenac Sodium (VOLTAREN) 1 % GEL Apply 2 g topically 4 (four) times daily. 100 g 0   escitalopram (LEXAPRO) 10 MG tablet Take 1  tablet (10 mg total) by mouth daily.     escitalopram (LEXAPRO) 5 MG tablet Take 1 tablet (5 mg total) by mouth daily for 7 days. TAKE 1 TABLET OF 5 MG ONCE DAILY, IN ADDITION TO YOUR CURRENT PRESCRIPTION OF 10 MG ONCE DAILY. FOR A TOTAL DAILY DOSE OF LEXAPRO 15 MG ONCE DAILY. 7 tablet 0   gabapentin (NEURONTIN) 300 MG capsule Take 1 capsule (300 mg total) by mouth 3 (three) times daily for 7 days. 21 capsule 0   hydrOXYzine (ATARAX) 25 MG tablet Take 1 tablet (25 mg total) by mouth 3 (three) times daily as needed for anxiety. 30 tablet 0   ibuprofen (ADVIL) 200 MG tablet Take 600 mg by mouth every 6 (six) hours as needed for mild pain.     Lidocaine-Menthol (ICY HOT MAX LIDOCAINE EX) Apply 1 Application topically daily as needed (pain).     melatonin 3 MG TABS tablet Take 3 mg by mouth at bedtime.     Misc Natural Products (OSTEO BI-FLEX TRIPLE STRENGTH PO) Take 1 capsule by mouth daily.     Multiple Vitamin (MULTIVITAMIN WITH MINERALS) TABS tablet Take 1 tablet by mouth daily. 30 tablet 0   neomycin-bacitracin-polymyxin (NEOSPORIN) OINT Apply 1 Application topically 2 (two) times daily.     pantoprazole (PROTONIX) 40 MG tablet TAKE 1 TABLET BY MOUTH DAILY (Patient taking differently: 40 mg daily.) 30 tablet 0   prazosin (MINIPRESS) 1 MG capsule Take 1 capsule (1 mg total) by mouth at bedtime for 7 days. 7 capsule 0   propranolol (INDERAL) 10 MG tablet Take 1 tablet (10 mg total) by mouth 2 (two) times daily.     QUEtiapine (SEROQUEL) 25 MG tablet Take 0.5 tablets (12.5 mg total) by mouth daily for 8 days. 4 tablet 0   QUEtiapine (SEROQUEL) 50 MG tablet Take 1 tablet (50 mg total) by mouth at bedtime for 7 days. 7 tablet 0    PTA Medications: (Not in a hospital admission)      06/27/2021    3:18 AM 05/01/2021    2:16 AM 02/03/2020    2:24 AM  Depression screen PHQ 2/9  Decreased Interest 1 2 3   Down, Depressed, Hopeless 2 3 3   PHQ - 2 Score 3 5 6   Altered sleeping 0 0 3  Tired, decreased  energy 1 1 3   Change in appetite 0 1 3  Feeling bad or failure about yourself  1 3 3   Trouble concentrating 2 3 3   Moving slowly or fidgety/restless 1 1 3   Suicidal thoughts 1 3 3   PHQ-9 Score 9 17 27   Difficult doing work/chores Somewhat difficult Extremely dIfficult     Flowsheet Row ED from 07/29/2021 in [facility] EMERG[facility]Y [facility]PART[facility] Admission (Discharged) [facility]osp-Admission (Discharged) from 07/20/2021 in [facility]  SURGICAL  C-SSRS RISK  CATEGORY High Risk No Risk Error: Question 6 not populated       Musculoskeletal  Strength & Muscle Tone: within normal limits Gait & Station: normal Patient leans: N/A  Psychiatric Specialty Exam  Presentation  General Appearance: Casual; Appropriate for Environment  Eye Contact:Good  Speech:Clear and Coherent; Normal Rate  Speech Volume:Normal  Handedness:Right   Mood and Affect  Mood:Euthymic  Affect:Congruent   Thought Process  Thought Processes:Coherent  Descriptions of Associations:Intact  Orientation:Full (Time, Place and Person)  Thought Content:Logical  Diagnosis of Schizophrenia or Schizoaffective disorder in past: No  Duration of Psychotic Symptoms: Greater than six months   Hallucinations:Hallucinations: None  Ideas of Reference:None  Suicidal Thoughts:Suicidal Thoughts: No  Homicidal Thoughts:Homicidal Thoughts: No   Sensorium  Memory:Immediate Good; Recent Good; Remote Good  Judgment:Good  Insight:Good   Executive Functions  Concentration:Good  Attention Span:Good  Recall:Good  Fund of Knowledge:Good  Language:Good   Psychomotor Activity  Psychomotor Activity:Psychomotor Activity: Normal   Assets  Assets:Desire for Improvement; Manufacturing systems engineer; Financial Resources/Insurance; Housing; Physical Health; Leisure Time; Resilience   Sleep  Sleep:Sleep:  Good   Nutritional Assessment (For OBS and FBC admissions only) Has the patient had a weight loss or gain of 10 pounds or more in the last 3 months?: No Has the patient had a decrease in food intake/or appetite?: No Does the patient have dental problems?: No Does the patient have eating habits or behaviors that may be indicators of an eating disorder including binging or inducing vomiting?: No Has the patient recently lost weight without trying?: 0 Has the patient been eating poorly because of a decreased appetite?: 0 Malnutrition Screening Tool Score: 0    Physical Exam  Physical Exam Vitals and nursing note reviewed.  Constitutional:      General: She is not in acute distress.    Appearance: Normal appearance. She is not ill-appearing.  HENT:     Head: Normocephalic.  Eyes:     General:        Right eye: No discharge.        Left eye: No discharge.     Conjunctiva/sclera: Conjunctivae normal.  Cardiovascular:     Rate and Rhythm: Normal rate.  Pulmonary:     Effort: Pulmonary effort is normal. No respiratory distress.  Musculoskeletal:        General: Normal range of motion.     Cervical back: Normal range of motion.  Skin:    Coloration: Skin is not jaundiced or pale.  Neurological:     Mental Status: She is alert and oriented to person, place, and time.  Psychiatric:        Attention and Perception: Attention and perception normal.        Mood and Affect: Mood and affect normal.        Speech: Speech normal.        Behavior: Behavior normal. Behavior is cooperative.        Thought Content: Thought content normal.        Cognition and Memory: Cognition normal.        Judgment: Judgment normal.    Review of Systems  Constitutional: Negative.   HENT: Negative.    Eyes: Negative.   Respiratory: Negative.    Cardiovascular: Negative.   Musculoskeletal: Negative.   Skin: Negative.   Neurological: Negative.   Psychiatric/Behavioral: Negative.     Blood pressure  (!) 115/56, pulse 79, temperature 98 F (36.7 C), temperature source Oral, resp. rate 18, last menstrual period  06/20/2021, SpO2 100 %. There is no height or weight on file to calculate BMI.  Demographic Factors:  Caucasian, Low socioeconomic status, Living alone, and Unemployed  Loss Factors: NA  Historical Factors: Prior suicide attempts and Impulsivity  Risk Reduction Factors:   Positive social support, Positive therapeutic relationship, and Positive coping skills or problem solving skills  Continued Clinical Symptoms:  Severe Anxiety and/or Agitation Depression:   Impulsivity More than one psychiatric diagnosis Previous Psychiatric Diagnoses and Treatments  Cognitive Features That Contribute To Risk:  None    Suicide Risk:  Minimal: No identifiable suicidal ideation.  Patients presenting with no risk factors but with morbid ruminations; may be classified as minimal risk based on the severity of the depressive symptoms  Plan Of Care/Follow-up recommendations:  Activity:  as tolerated  Diet:  regular   Disposition: Discharge patient.  She has an appointment with her psychiatrist with her ACTT team on Wednesday.  She is also followed by a community support team and states she will call her support person [name] when she is discharged.  She has an appointment today at 1:00 with her medical provider.  Patient receives her medicines in blister packs weekly by mail from pharmacy.     No evidence of imminent risk to self or others at present.    Patient does not meet criteria for psychiatric inpatient admission. Discussed crisis plan, support from social network, calling 911, coming to the Emergency Department, and calling Suicide Hotline.   Ardis Hughs, NP 07/31/2021, 1:11 PM

## 2021-08-01 ENCOUNTER — Emergency Department (HOSPITAL_COMMUNITY)
Admission: EM | Admit: 2021-08-01 | Discharge: 2021-08-01 | Discharge status: Against Medical Advice | Payer: PPO | Attending: Emergency Medicine | Admitting: Emergency Medicine

## 2021-08-01 ENCOUNTER — Other Ambulatory Visit: Payer: Self-pay

## 2021-08-01 ENCOUNTER — Encounter (HOSPITAL_COMMUNITY): Payer: Self-pay

## 2021-08-01 ENCOUNTER — Emergency Department (HOSPITAL_COMMUNITY): Payer: PPO

## 2021-08-01 DIAGNOSIS — R0602 Shortness of breath: Secondary | ICD-10-CM | POA: Insufficient documentation

## 2021-08-01 DIAGNOSIS — N939 Abnormal uterine and vaginal bleeding, unspecified: Secondary | ICD-10-CM | POA: Diagnosis not present

## 2021-08-01 DIAGNOSIS — R079 Chest pain, unspecified: Secondary | ICD-10-CM | POA: Diagnosis not present

## 2021-08-01 DIAGNOSIS — R0789 Other chest pain: Secondary | ICD-10-CM | POA: Diagnosis not present

## 2021-08-01 DIAGNOSIS — R58 Hemorrhage, not elsewhere classified: Secondary | ICD-10-CM | POA: Diagnosis not present

## 2021-08-01 LAB — CBC
HCT: 40.1 % (ref 36.0–46.0)
Hemoglobin: 12.4 g/dL (ref 12.0–15.0)
MCH: 25.6 pg — ABNORMAL LOW (ref 26.0–34.0)
MCHC: 30.9 g/dL (ref 30.0–36.0)
MCV: 82.9 fL (ref 80.0–100.0)
Platelets: 305 10*3/uL (ref 150–400)
RBC: 4.84 MIL/uL (ref 3.87–5.11)
RDW: 14 % (ref 11.5–15.5)
WBC: 8 10*3/uL (ref 4.0–10.5)
nRBC: 0 % (ref 0.0–0.2)

## 2021-08-01 LAB — BASIC METABOLIC PANEL
Anion gap: 10 (ref 5–15)
BUN: 13 mg/dL (ref 6–20)
CO2: 20 mmol/L — ABNORMAL LOW (ref 22–32)
Calcium: 9.3 mg/dL (ref 8.9–10.3)
Chloride: 107 mmol/L (ref 98–111)
Creatinine, Ser: 0.63 mg/dL (ref 0.44–1.00)
GFR, Estimated: >60 mL/min (ref >60)
Glucose, Bld: 92 mg/dL (ref 70–99)
Potassium: 3.7 mmol/L (ref 3.5–5.1)
Sodium: 137 mmol/L (ref 135–145)

## 2021-08-01 LAB — URINALYSIS, ROUTINE W REFLEX MICROSCOPIC
Bilirubin Urine: NEGATIVE
Glucose, UA: NEGATIVE mg/dL
Hgb urine dipstick: NEGATIVE
Ketones, ur: NEGATIVE mg/dL
Leukocytes,Ua: NEGATIVE
Nitrite: NEGATIVE
Protein, ur: NEGATIVE mg/dL
Specific Gravity, Urine: 1.018 (ref 1.005–1.030)
pH: 5 (ref 5.0–8.0)

## 2021-08-01 LAB — TROPONIN I (HIGH SENSITIVITY): Troponin I (High Sensitivity): 2 ng/L (ref <18)

## 2021-08-01 LAB — I-STAT BETA HCG BLOOD, ED (MC, WL, AP ONLY): I-stat hCG, quantitative: <5 m[IU]/mL (ref <5)

## 2021-08-01 NOTE — ED Triage Notes (Addendum)
BIB EMS for heavy vaginal bleeding x 3 days. Hx of mental health SI HI recently hospitalized for mental health. Reports sob, cp and going through 4 tampons an hour.  Also reports lower abd cramping.

## 2021-08-01 NOTE — ED Notes (Signed)
No answer x3

## 2021-08-01 NOTE — ED Notes (Signed)
No answer to vitals.

## 2021-08-01 NOTE — ED Notes (Signed)
No answer or vitals x2

## 2021-08-02 ENCOUNTER — Ambulatory Visit (HOSPITAL_COMMUNITY)
Admission: EM | Admit: 2021-08-02 | Discharge: 2021-08-03 | Disposition: A | Discharge status: Home / Self Care | Payer: PPO | Attending: Nurse Practitioner | Admitting: Nurse Practitioner

## 2021-08-02 DIAGNOSIS — K219 Gastro-esophageal reflux disease without esophagitis: Secondary | ICD-10-CM | POA: Diagnosis not present

## 2021-08-02 DIAGNOSIS — F840 Autistic disorder: Secondary | ICD-10-CM | POA: Diagnosis not present

## 2021-08-02 DIAGNOSIS — R569 Unspecified convulsions: Secondary | ICD-10-CM | POA: Insufficient documentation

## 2021-08-02 DIAGNOSIS — Z20822 Contact with and (suspected) exposure to covid-19: Secondary | ICD-10-CM | POA: Insufficient documentation

## 2021-08-02 DIAGNOSIS — R451 Restlessness and agitation: Secondary | ICD-10-CM | POA: Insufficient documentation

## 2021-08-02 DIAGNOSIS — Z825 Family history of asthma and other chronic lower respiratory diseases: Secondary | ICD-10-CM | POA: Insufficient documentation

## 2021-08-02 DIAGNOSIS — Z9151 Personal history of suicidal behavior: Secondary | ICD-10-CM | POA: Insufficient documentation

## 2021-08-02 DIAGNOSIS — Z818 Family history of other mental and behavioral disorders: Secondary | ICD-10-CM | POA: Diagnosis not present

## 2021-08-02 DIAGNOSIS — G4700 Insomnia, unspecified: Secondary | ICD-10-CM | POA: Diagnosis not present

## 2021-08-02 DIAGNOSIS — F250 Schizoaffective disorder, bipolar type: Secondary | ICD-10-CM | POA: Insufficient documentation

## 2021-08-02 DIAGNOSIS — F4310 Post-traumatic stress disorder, unspecified: Secondary | ICD-10-CM | POA: Insufficient documentation

## 2021-08-02 DIAGNOSIS — Z820 Family history of epilepsy and other diseases of the nervous system: Secondary | ICD-10-CM | POA: Diagnosis not present

## 2021-08-02 DIAGNOSIS — J45909 Unspecified asthma, uncomplicated: Secondary | ICD-10-CM | POA: Insufficient documentation

## 2021-08-02 DIAGNOSIS — R450 Nervousness: Secondary | ICD-10-CM | POA: Insufficient documentation

## 2021-08-02 DIAGNOSIS — F314 Bipolar disorder, current episode depressed, severe, without psychotic features: Secondary | ICD-10-CM | POA: Insufficient documentation

## 2021-08-02 DIAGNOSIS — F411 Generalized anxiety disorder: Secondary | ICD-10-CM | POA: Diagnosis not present

## 2021-08-02 DIAGNOSIS — R419 Unspecified symptoms and signs involving cognitive functions and awareness: Secondary | ICD-10-CM | POA: Diagnosis not present

## 2021-08-02 DIAGNOSIS — R45851 Suicidal ideations: Secondary | ICD-10-CM | POA: Diagnosis not present

## 2021-08-02 DIAGNOSIS — Z91410 Personal history of adult physical and sexual abuse: Secondary | ICD-10-CM | POA: Insufficient documentation

## 2021-08-02 DIAGNOSIS — Z638 Other specified problems related to primary support group: Secondary | ICD-10-CM | POA: Insufficient documentation

## 2021-08-02 DIAGNOSIS — Z5989 Other problems related to housing and economic circumstances: Secondary | ICD-10-CM | POA: Insufficient documentation

## 2021-08-02 DIAGNOSIS — F603 Borderline personality disorder: Secondary | ICD-10-CM | POA: Insufficient documentation

## 2021-08-02 DIAGNOSIS — F428 Other obsessive-compulsive disorder: Secondary | ICD-10-CM | POA: Insufficient documentation

## 2021-08-02 DIAGNOSIS — Z79899 Other long term (current) drug therapy: Secondary | ICD-10-CM | POA: Insufficient documentation

## 2021-08-02 DIAGNOSIS — R488 Other symbolic dysfunctions: Secondary | ICD-10-CM | POA: Diagnosis not present

## 2021-08-02 MED ORDER — ADULT MULTIVITAMIN W/MINERALS CH
1.0000 | ORAL_TABLET | Freq: Every day | ORAL | Status: DC
  Administered 2021-08-03: 1 via ORAL
  Filled 2021-08-02: qty 1

## 2021-08-02 MED ORDER — MAGNESIUM HYDROXIDE 400 MG/5ML PO SUSP: 30.0000 mL | Freq: Every day | ORAL | Status: DC | PRN

## 2021-08-02 MED ORDER — QUETIAPINE FUMARATE 25 MG PO TABS
12.5000 mg | ORAL_TABLET | Freq: Every day | ORAL | Status: DC
  Administered 2021-08-03: 12.5 mg via ORAL
  Filled 2021-08-02: qty 1

## 2021-08-02 MED ORDER — ALUM & MAG HYDROXIDE-SIMETH 200-200-20 MG/5ML PO SUSP: 30.0000 mL | ORAL | Status: DC | PRN

## 2021-08-02 MED ORDER — GABAPENTIN 300 MG PO CAPS
300.0000 mg | ORAL_CAPSULE | Freq: Three times a day (TID) | ORAL | Status: DC
  Administered 2021-08-03 (×2): 300 mg via ORAL
  Filled 2021-08-02 (×2): qty 1

## 2021-08-02 MED ORDER — ALBUTEROL SULFATE HFA 108 (90 BASE) MCG/ACT IN AERS: 2.0000 | INHALATION_SPRAY | Freq: Four times a day (QID) | RESPIRATORY_TRACT | Status: DC | PRN

## 2021-08-02 MED ORDER — QUETIAPINE FUMARATE 50 MG PO TABS
50.0000 mg | ORAL_TABLET | Freq: Every day | ORAL | Status: DC
  Administered 2021-08-03: 50 mg via ORAL
  Filled 2021-08-02: qty 1

## 2021-08-02 MED ORDER — MELATONIN 3 MG PO TABS
3.0000 mg | ORAL_TABLET | Freq: Every day | ORAL | Status: DC
  Administered 2021-08-03: 3 mg via ORAL
  Filled 2021-08-02: qty 1

## 2021-08-02 MED ORDER — PROPRANOLOL HCL 10 MG PO TABS
10.0000 mg | ORAL_TABLET | Freq: Two times a day (BID) | ORAL | Status: DC
  Administered 2021-08-03 (×2): 10 mg via ORAL
  Filled 2021-08-02 (×2): qty 1

## 2021-08-02 MED ORDER — CALCIUM CARBONATE ANTACID 500 MG PO CHEW: 1500.0000 mg | CHEWABLE_TABLET | Freq: Two times a day (BID) | ORAL | Status: DC | PRN

## 2021-08-02 MED ORDER — HYDROXYZINE HCL 25 MG PO TABS
25.0000 mg | ORAL_TABLET | Freq: Three times a day (TID) | ORAL | Status: DC | PRN
  Administered 2021-08-03: 25 mg via ORAL
  Filled 2021-08-02: qty 1

## 2021-08-02 MED ORDER — ESCITALOPRAM OXALATE 10 MG PO TABS
10.0000 mg | ORAL_TABLET | Freq: Every day | ORAL | Status: DC
  Administered 2021-08-03: 10 mg via ORAL
  Filled 2021-08-02: qty 1

## 2021-08-02 MED ORDER — PRAZOSIN HCL 1 MG PO CAPS
1.0000 mg | ORAL_CAPSULE | Freq: Every day | ORAL | Status: DC
  Administered 2021-08-03: 1 mg via ORAL
  Filled 2021-08-02: qty 1

## 2021-08-02 MED ORDER — MOMETASONE FURO-FORMOTEROL FUM 200-5 MCG/ACT IN AERO
2.0000 | INHALATION_SPRAY | Freq: Two times a day (BID) | RESPIRATORY_TRACT | Status: DC
  Administered 2021-08-03: 2 via RESPIRATORY_TRACT
  Filled 2021-08-02: qty 8.8

## 2021-08-02 MED ORDER — ESCITALOPRAM OXALATE 5 MG PO TABS
5.0000 mg | ORAL_TABLET | Freq: Every day | ORAL | Status: DC
  Administered 2021-08-03: 5 mg via ORAL
  Filled 2021-08-02: qty 1

## 2021-08-02 MED ORDER — PANTOPRAZOLE SODIUM 40 MG PO TBEC
40.0000 mg | DELAYED_RELEASE_TABLET | Freq: Every day | ORAL | Status: DC
  Administered 2021-08-03: 40 mg via ORAL
  Filled 2021-08-02: qty 1

## 2021-08-02 MED ORDER — CARIPRAZINE HCL 3 MG PO CAPS
3.0000 mg | ORAL_CAPSULE | Freq: Every day | ORAL | Status: DC
  Administered 2021-08-03: 3 mg via ORAL
  Filled 2021-08-02: qty 1

## 2021-08-02 NOTE — ED Provider Notes (Signed)
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  0.0 - 0.5 K/uL Final   Basophils Relative 07/20/2021 0  % Final   Basophils Absolute 07/20/2021 0.0  0.0 - 0.1 K/uL Final   Immature Granulocytes 07/20/2021 0  % Final   Abs Immature Granulocytes 07/20/2021 0.03  0.00 - 0.07 K/uL Final   Performed at [facility], [place] [address] [place], [place] [address]   I-stat  hCG, quantitative 07/20/2021 <5.0  <5 mIU/mL Final   Comment 3 07/20/2021          Final   Comment:   GEST. AGE      CONC.  (mIU/mL)   <=1 WEEK        5 - 50     2 WEEKS       50 - 500     3 WEEKS       100 - 10,000     4 WEEKS     1,000 - 30,000        FEMALE AND NON-PREGNANT FEMALE:     LESS THAN 5 mIU/mL    Sodium 07/21/2021 140  135 - 145 mmol/L Final   Potassium 07/21/2021 3.8  3.5 - 5.1 mmol/L Final   Chloride 07/21/2021 112 (H)  98 - 111 mmol/L Final   CO2 07/21/2021 22  22 - 32 mmol/L Final   Glucose, Bld 07/21/2021 106 (H)  70 - 99 mg/dL Final   Glucose reference range applies only to samples taken after fasting for at least 8 hours.   BUN 07/21/2021 13  6 - 20 mg/dL Final   Creatinine, Ser 07/21/2021 0.61  0.44 - 1.00 mg/dL Final   Calcium 07/21/2021 8.5 (L)  8.9 - 10.3 mg/dL Final   Total Protein 07/21/2021 5.6 (L)  6.5 - 8.1 g/dL Final   Albumin 07/21/2021 3.2 (L)  3.5 - 5.0 g/dL Final   AST 07/21/2021 16  15 - 41 U/L Final   ALT 07/21/2021 12  0 - 44 U/L Final   Alkaline Phosphatase 07/21/2021 50  38 - 126 U/L Final   Total Bilirubin 07/21/2021 0.7  0.3 - 1.2 mg/dL Final   GFR, Estimated 07/21/2021 >60  >60 mL/min Final   Comment: (NOTE) Calculated using the CKD-EPI Creatinine Equation (2021)    Anion gap 07/21/2021 6  5 - 15 Final   Performed at [facility], [place] [address] [place], [place] [address]   WBC 07/21/2021 6.9  4.0 - 10.5 K/uL Final   RBC 07/21/2021 3.77 (L)  3.87 - 5.11 MIL/uL Final   Hemoglobin 07/21/2021 9.8 (L)  12.0 - 15.0 g/dL Final   HCT 07/21/2021 31.7 (L)  36.0 - 46.0 % Final   MCV 07/21/2021 84.1  80.0 - 100.0 fL Final   MCH 07/21/2021 26.0  26.0 - 34.0 pg Final   MCHC 07/21/2021 30.9  30.0 - 36.0 g/dL Final   RDW 07/21/2021 14.6  11.5 - 15.5 % Final   Platelets 07/21/2021 242  150 - 400 K/uL Final   nRBC 07/21/2021 0.0  0.0 - 0.2 % Final   Performed at [facility] [address] [place], [place] [address]   SARS Coronavirus  2 07/24/2021 NEGATIVE  NEGATIVE Final   Comment: (NOTE) SARS-CoV-2 target nucleic acids are NOT DETECTED.  The SARS-CoV-2 RNA is generally detectable in upper and lower respiratory specimens during the acute phase of infection. Negative results do not preclude SARS-CoV-2 infection, do not rule out co-infections with other pathogens,  and should not be used as the sole basis for treatment or other patient management decisions. Negative results must be combined with clinical observations, patient history, and epidemiological information. The expected result is Negative.  Fact Sheet for Patients: SugarRoll.be  Fact Sheet for Healthcare Providers: https://www.woods-mathews.com/  This test is not yet approved or cleared by the [place] FDA and  has been authorized for detection and/or diagnosis of SARS-CoV-2 by FDA under an Emergency Use Authorization (EUA). This EUA will remain  in effect (meaning this test can be used) for the duration of the COVID-19 declaration under Se                          ction 564(b)(1) of the Act, 21 U.S.C. section 360bbb-3(b)(1), unless the authorization is terminated or revoked sooner.  Performed at [facility], [place] [address] [place], [place] [address]   Admission on 07/19/2021, Discharged on 07/20/2021  Component Date Value Ref Range Status   SARS Coronavirus 2 by RT PCR 07/19/2021 NEGATIVE  NEGATIVE Final   Comment: (NOTE) SARS-CoV-2 target nucleic acids are NOT DETECTED.  The SARS-CoV-2 RNA is generally detectable in upper respiratory specimens during the acute phase of infection. The lowest concentration of SARS-CoV-2 viral copies this assay can detect is 138 copies/mL. A negative result does not preclude SARS-Cov-2 infection and should not be used as the sole basis for treatment or other patient management decisions. A negative result may occur with  improper specimen collection/handling,  submission of specimen other than nasopharyngeal swab, presence of viral mutation(s) within the areas targeted by this assay, and inadequate number of viral copies(<138 copies/mL). A negative result must be combined with clinical observations, patient history, and epidemiological information. The expected result is Negative.  Fact Sheet for Patients:  EntrepreneurPulse.com.au  Fact Sheet for Healthcare Providers:  IncredibleEmployment.be  This test is no                          t yet approved or cleared by the [place] FDA and  has been authorized for detection and/or diagnosis of SARS-CoV-2 by FDA under an Emergency Use Authorization (EUA). This EUA will remain  in effect (meaning this test can be used) for the duration of the COVID-19 declaration under Section 564(b)(1) of the Act, 21 U.S.C.section 360bbb-3(b)(1), unless the authorization is terminated  or revoked sooner.       Influenza A by PCR 07/19/2021 NEGATIVE  NEGATIVE Final   Influenza B by PCR 07/19/2021 NEGATIVE  NEGATIVE Final   Comment: (NOTE) The Xpert Xpress SARS-CoV-2/FLU/RSV plus assay is intended as an aid in the diagnosis of influenza from Nasopharyngeal swab specimens and should not be used as a sole basis for treatment. Nasal washings and aspirates are unacceptable for Xpert Xpress SARS-CoV-2/FLU/RSV testing.  Fact Sheet for Patients: EntrepreneurPulse.com.au  Fact Sheet for Healthcare Providers: IncredibleEmployment.be  This test is not yet approved or cleared by the [place] FDA and has been authorized for detection and/or diagnosis of SARS-CoV-2 by FDA under an Emergency Use Authorization (EUA). This EUA will remain in effect (meaning this test can be used) for the duration of the COVID-19 declaration under Section 564(b)(1) of the Act, 21 U.S.C. section 360bbb-3(b)(1), unless the authorization is terminated  or revoked.  Performed at [facility], [place] [address], [place], [place] [address]    POC Amphetamine UR 07/20/2021 None Detected  NONE DETECTED (Cut  Off Level 1000 ng/mL) Final   POC Secobarbital (BAR) 07/20/2021 None Detected  NONE DETECTED (Cut Off Level 300 ng/mL) Final   POC Buprenorphine (BUP) 07/20/2021 None Detected  NONE DETECTED (Cut Off Level 10 ng/mL) Final   POC Oxazepam (BZO) 07/20/2021 Positive (A)  NONE DETECTED (Cut Off Level 300 ng/mL) Final   POC Cocaine UR 07/20/2021 None Detected  NONE DETECTED (Cut Off Level 300 ng/mL) Final   POC Methamphetamine UR 07/20/2021 None Detected  NONE DETECTED (Cut Off Level 1000 ng/mL) Final   POC Morphine 07/20/2021 None Detected  NONE DETECTED (Cut Off Level 300 ng/mL) Final   POC Methadone UR 07/20/2021 None Detected  NONE DETECTED (Cut Off Level 300 ng/mL) Final   POC Oxycodone UR 07/20/2021 None Detected  NONE DETECTED (Cut Off Level 100 ng/mL) Final   POC Marijuana UR 07/20/2021 None Detected  NONE DETECTED (Cut Off Level 50 ng/mL) Final   SARSCOV2ONAVIRUS 2 AG 07/20/2021 NEGATIVE  NEGATIVE Final   Comment: (NOTE) SARS-CoV-2 antigen NOT DETECTED.   Negative results are presumptive.  Negative results do not preclude SARS-CoV-2 infection and should not be used as the sole basis for treatment or other patient management decisions, including infection  control decisions, particularly in the presence of clinical signs and  symptoms consistent with COVID-19, or in those who have been in contact with the virus.  Negative results must be combined with clinical observations, patient history, and epidemiological information. The expected result is Negative.  Fact Sheet for Patients: HandmadeRecipes.com.cy  Fact Sheet for Healthcare Providers: FuneralLife.at  This test is not yet approved or cleared by the [place] FDA and  has been authorized for detection and/or diagnosis of  SARS-CoV-2 by FDA under an Emergency Use Authorization (EUA).  This EUA will remain in effect (meaning this test can be used) for the duration of  the COV                          ID-19 declaration under Section 564(b)(1) of the Act, 21 U.S.C. section 360bbb-3(b)(1), unless the authorization is terminated or revoked sooner.     Preg Test, Ur 07/20/2021 NEGATIVE  NEGATIVE Final   Comment:        THE SENSITIVITY OF THIS METHODOLOGY IS >24 mIU/mL   Admission on 07/18/2021, Discharged on 07/19/2021  Component Date Value Ref Range Status   SARS Coronavirus 2 by RT PCR 07/18/2021 NEGATIVE  NEGATIVE Final   Comment: (NOTE) SARS-CoV-2 target nucleic acids are NOT DETECTED.  The SARS-CoV-2 RNA is generally detectable in upper respiratory specimens during the acute phase of infection. The lowest concentration of SARS-CoV-2 viral copies this assay can detect is 138 copies/mL. A negative result does not preclude SARS-Cov-2 infection and should not be used as the sole basis for treatment or other patient management decisions. A negative result may occur with  improper specimen collection/handling, submission of specimen other than nasopharyngeal swab, presence of viral mutation(s) within the areas targeted by this assay, and inadequate number of viral copies(<138 copies/mL). A negative result must be combined with clinical observations, patient history, and epidemiological information. The expected result is Negative.  Fact Sheet for Patients:  EntrepreneurPulse.com.au  Fact Sheet for Healthcare Providers:  IncredibleEmployment.be  This test is no                          t yet approved or cleared by the [place] and  has been authorized for detection and/or diagnosis of SARS-CoV-2 by FDA under an Emergency Use Authorization (EUA). This EUA will remain  in effect (meaning this test can be used) for the duration of the COVID-19 declaration  under Section 564(b)(1) of the Act, 21 U.S.C.section 360bbb-3(b)(1), unless the authorization is terminated  or revoked sooner.       Influenza A by PCR 07/18/2021 NEGATIVE  NEGATIVE Final   Influenza B by PCR 07/18/2021 NEGATIVE  NEGATIVE Final   Comment: (NOTE) The Xpert Xpress SARS-CoV-2/FLU/RSV plus assay is intended as an aid in the diagnosis of influenza from Nasopharyngeal swab specimens and should not be used as a sole basis for treatment. Nasal washings and aspirates are unacceptable for Xpert Xpress SARS-CoV-2/FLU/RSV testing.  Fact Sheet for Patients: EntrepreneurPulse.com.au  Fact Sheet for Healthcare Providers: IncredibleEmployment.be  This test is not yet approved or cleared by the [place] FDA and has been authorized for detection and/or diagnosis of SARS-CoV-2 by FDA under an Emergency Use Authorization (EUA). This EUA will remain in effect (meaning this test can be used) for the duration of the COVID-19 declaration under Section 564(b)(1) of the Act, 21 U.S.C. section 360bbb-3(b)(1), unless the authorization is terminated or revoked.  Performed at [facility], [place] [address] [place], [place] [address]    Sodium 07/18/2021 138  135 - 145 mmol/L Final   Potassium 07/18/2021 3.8  3.5 - 5.1 mmol/L Final   Chloride 07/18/2021 109  98 - 111 mmol/L Final   CO2 07/18/2021 22  22 - 32 mmol/L Final   Glucose, Bld 07/18/2021 99  70 - 99 mg/dL Final   Glucose reference range applies only to samples taken after fasting for at least 8 hours.   BUN 07/18/2021 15  6 - 20 mg/dL Final   Creatinine, Ser 07/18/2021 0.60  0.44 - 1.00 mg/dL Final   Calcium 07/18/2021 9.2  8.9 - 10.3 mg/dL Final   Total Protein 07/18/2021 6.8  6.5 - 8.1 g/dL Final   Albumin 07/18/2021 3.9  3.5 - 5.0 g/dL Final   AST 07/18/2021 16  15 - 41 U/L Final   ALT 07/18/2021 16  0 - 44 U/L Final   Alkaline Phosphatase 07/18/2021 56  38 - 126 U/L Final    Total Bilirubin 07/18/2021 0.3  0.3 - 1.2 mg/dL Final   GFR, Estimated 07/18/2021 >60  >60 mL/min Final   Comment: (NOTE) Calculated using the CKD-EPI Creatinine Equation (2021)    Anion gap 07/18/2021 7  5 - 15 Final   Performed at [facility] [address] [place], [place] [address]   Alcohol, Ethyl (B) 07/18/2021 <10  <10 mg/dL Final   Comment: (NOTE) Lowest detectable limit for serum alcohol is 10 mg/dL.  For medical purposes only. Performed at [facility], [place] [address], [place], [place] [address]    Opiates 07/18/2021 NONE DETECTED  NONE DETECTED Final   Cocaine 07/18/2021 NONE DETECTED  NONE DETECTED Final   Benzodiazepines 07/18/2021 NONE DETECTED  NONE DETECTED Final   Amphetamines 07/18/2021 NONE DETECTED  NONE DETECTED Final   Tetrahydrocannabinol 07/18/2021 NONE DETECTED  NONE DETECTED Final   Barbiturates 07/18/2021 NONE DETECTED  NONE DETECTED Final   Comment: (NOTE) DRUG SCREEN FOR MEDICAL PURPOSES ONLY.  IF CONFIRMATION IS NEEDED FOR ANY PURPOSE, NOTIFY LAB WITHIN 5 DAYS.  LOWEST DETECTABLE LIMITS FOR URINE DRUG SCREEN Drug Class  Cutoff (ng/mL) Amphetamine and metabolites    1000 Barbiturate and metabolites    200 Benzodiazepine                 323 Tricyclics and metabolites     300 Opiates and metabolites        300 Cocaine and metabolites        300 THC                            50 Performed at [facility], [place] [address] [place], [place] [address]    WBC 07/18/2021 8.4  4.0 - 10.5 K/uL Final   RBC 07/18/2021 4.46  3.87 - 5.11 MIL/uL Final   Hemoglobin 07/18/2021 11.7 (L)  12.0 - 15.0 g/dL Final   HCT 07/18/2021 37.5  36.0 - 46.0 % Final   MCV 07/18/2021 84.1  80.0 - 100.0 fL Final   MCH 07/18/2021 26.2  26.0 - 34.0 pg Final   MCHC 07/18/2021 31.2  30.0 - 36.0 g/dL Final   RDW 07/18/2021 14.3  11.5 - 15.5 % Final   Platelets 07/18/2021 239  150 - 400 K/uL Final   REPEATED TO VERIFY   nRBC 07/18/2021 0.0   0.0 - 0.2 % Final   Neutrophils Relative % 07/18/2021 58  % Final   Neutro Abs 07/18/2021 5.0  1.7 - 7.7 K/uL Final   Lymphocytes Relative 07/18/2021 34  % Final   Lymphs Abs 07/18/2021 2.8  0.7 - 4.0 K/uL Final   Monocytes Relative 07/18/2021 5  % Final   Monocytes Absolute 07/18/2021 0.4  0.1 - 1.0 K/uL Final   Eosinophils Relative 07/18/2021 1  % Final   Eosinophils Absolute 07/18/2021 0.0  0.0 - 0.5 K/uL Final   Basophils Relative 07/18/2021 1  % Final   Basophils Absolute 07/18/2021 0.0  0.0 - 0.1 K/uL Final   Immature Granulocytes 07/18/2021 1  % Final   Abs Immature Granulocytes 07/18/2021 0.04  0.00 - 0.07 K/uL Final   Performed at [facility], [place] [address] [place], [place] [address]   I-stat hCG, quantitative 07/18/2021 <5.0  <5 mIU/mL Final   Comment 3 07/18/2021          Final   Comment:   GEST. AGE      CONC.  (mIU/mL)   <=1 WEEK        5 - 50     2 WEEKS       50 - 500     3 WEEKS       100 - 10,000     4 WEEKS     1,000 - 30,000        FEMALE AND NON-PREGNANT FEMALE:     LESS THAN 5 mIU/mL   Admission on 07/15/2021, Discharged on 07/17/2021  Component Date Value Ref Range Status   SARS Coronavirus 2 by RT PCR 07/15/2021 NEGATIVE  NEGATIVE Final   Comment: (NOTE) SARS-CoV-2 target nucleic acids are NOT DETECTED.  The SARS-CoV-2 RNA is generally detectable in upper respiratory specimens during the acute phase of infection. The lowest concentration of SARS-CoV-2 viral copies this assay can detect is 138 copies/mL. A negative result does not preclude SARS-Cov-2 infection and should not be used as the sole basis for treatment or other patient management decisions. A negative result may occur with  improper specimen collection/handling, submission of specimen other than nasopharyngeal swab, presence of viral mutation(s) within  the areas targeted by this assay, and inadequate number of viral copies(<138 copies/mL). A negative result must be combined  with clinical observations, patient history, and epidemiological information. The expected result is Negative.  Fact Sheet for Patients:  EntrepreneurPulse.com.au  Fact Sheet for Healthcare Providers:  IncredibleEmployment.be  This test is no                          t yet approved or cleared by the [place] FDA and  has been authorized for detection and/or diagnosis of SARS-CoV-2 by FDA under an Emergency Use Authorization (EUA). This EUA will remain  in effect (meaning this test can be used) for the duration of the COVID-19 declaration under Section 564(b)(1) of the Act, 21 U.S.C.section 360bbb-3(b)(1), unless the authorization is terminated  or revoked sooner.       Influenza A by PCR 07/15/2021 NEGATIVE  NEGATIVE Final   Influenza B by PCR 07/15/2021 NEGATIVE  NEGATIVE Final   Comment: (NOTE) The Xpert Xpress SARS-CoV-2/FLU/RSV plus assay is intended as an aid in the diagnosis of influenza from Nasopharyngeal swab specimens and should not be used as a sole basis for treatment. Nasal washings and aspirates are unacceptable for Xpert Xpress SARS-CoV-2/FLU/RSV testing.  Fact Sheet for Patients: EntrepreneurPulse.com.au  Fact Sheet for Healthcare Providers: IncredibleEmployment.be  This test is not yet approved or cleared by the [place] FDA and has been authorized for detection and/or diagnosis of SARS-CoV-2 by FDA under an Emergency Use Authorization (EUA). This EUA will remain in effect (meaning this test can be used) for the duration of the COVID-19 declaration under Section 564(b)(1) of the Act, 21 U.S.C. section 360bbb-3(b)(1), unless the authorization is terminated or revoked.  Performed at [facility], [place] [address], [place], [place] [address]    POC Amphetamine UR 07/15/2021 None Detected  NONE DETECTED (Cut Off Level 1000 ng/mL) Final   POC Secobarbital (BAR) 07/15/2021  None Detected  NONE DETECTED (Cut Off Level 300 ng/mL) Final   POC Buprenorphine (BUP) 07/15/2021 None Detected  NONE DETECTED (Cut Off Level 10 ng/mL) Final   POC Oxazepam (BZO) 07/15/2021 Positive (A)  NONE DETECTED (Cut Off Level 300 ng/mL) Final   POC Cocaine UR 07/15/2021 None Detected  NONE DETECTED (Cut Off Level 300 ng/mL) Final   POC Methamphetamine UR 07/15/2021 None Detected  NONE DETECTED (Cut Off Level 1000 ng/mL) Final   POC Morphine 07/15/2021 None Detected  NONE DETECTED (Cut Off Level 300 ng/mL) Final   POC Methadone UR 07/15/2021 None Detected  NONE DETECTED (Cut Off Level 300 ng/mL) Final   POC Oxycodone UR 07/15/2021 None Detected  NONE DETECTED (Cut Off Level 100 ng/mL) Final   POC Marijuana UR 07/15/2021 None Detected  NONE DETECTED (Cut Off Level 50 ng/mL) Final   SARSCOV2ONAVIRUS 2 AG 07/15/2021 NEGATIVE  NEGATIVE Final   Comment: (NOTE) SARS-CoV-2 antigen NOT DETECTED.   Negative results are presumptive.  Negative results do not preclude SARS-CoV-2 infection and should not be used as the sole basis for treatment or other patient management decisions, including infection  control decisions, particularly in the presence of clinical signs and  symptoms consistent with COVID-19, or in those who have been in contact with the virus.  Negative results must be combined with clinical observations, patient history, and epidemiological information. The expected result is Negative.  Fact Sheet for Patients: HandmadeRecipes.com.cy  Fact Sheet for Healthcare Providers: FuneralLife.at  This test is not yet approved or  cleared by the [place] and  has been authorized for detection and/or diagnosis of SARS-CoV-2 by FDA under an Emergency Use Authorization (EUA).  This EUA will remain in effect (meaning this test can be used) for the duration of  the COV                          ID-19 declaration under Section 564(b)(1) of  the Act, 21 U.S.C. section 360bbb-3(b)(1), unless the authorization is terminated or revoked sooner.     Preg Test, Ur 07/15/2021 NEGATIVE  NEGATIVE Final   Comment:        THE SENSITIVITY OF THIS METHODOLOGY IS >24 mIU/mL   Admission on 07/12/2021, Discharged on 07/14/2021  Component Date Value Ref Range Status   SARS Coronavirus 2 by RT PCR 07/12/2021 NEGATIVE  NEGATIVE Final   Comment: (NOTE) SARS-CoV-2 target nucleic acids are NOT DETECTED.  The SARS-CoV-2 RNA is generally detectable in upper respiratory specimens during the acute phase of infection. The lowest concentration of SARS-CoV-2 viral copies this assay can detect is 138 copies/mL. A negative result does not preclude SARS-Cov-2 infection and should not be used as the sole basis for treatment or other patient management decisions. A negative result may occur with  improper specimen collection/handling, submission of specimen other than nasopharyngeal swab, presence of viral mutation(s) within the areas targeted by this assay, and inadequate number of viral copies(<138 copies/mL). A negative result must be combined with clinical observations, patient history, and epidemiological information. The expected result is Negative.  Fact Sheet for Patients:  EntrepreneurPulse.com.au  Fact Sheet for Healthcare Providers:  IncredibleEmployment.be  This test is no                          t yet approved or cleared by the [place] FDA and  has been authorized for detection and/or diagnosis of SARS-CoV-2 by FDA under an Emergency Use Authorization (EUA). This EUA will remain  in effect (meaning this test can be used) for the duration of the COVID-19 declaration under Section 564(b)(1) of the Act, 21 U.S.C.section 360bbb-3(b)(1), unless the authorization is terminated  or revoked sooner.       Influenza A by PCR 07/12/2021 NEGATIVE  NEGATIVE Final   Influenza B by PCR 07/12/2021  NEGATIVE  NEGATIVE Final   Comment: (NOTE) The Xpert Xpress SARS-CoV-2/FLU/RSV plus assay is intended as an aid in the diagnosis of influenza from Nasopharyngeal swab specimens and should not be used as a sole basis for treatment. Nasal washings and aspirates are unacceptable for Xpert Xpress SARS-CoV-2/FLU/RSV testing.  Fact Sheet for Patients: EntrepreneurPulse.com.au  Fact Sheet for Healthcare Providers: IncredibleEmployment.be  This test is not yet approved or cleared by the [place] FDA and has been authorized for detection and/or diagnosis of SARS-CoV-2 by FDA under an Emergency Use Authorization (EUA). This EUA will remain in effect (meaning this test can be used) for the duration of the COVID-19 declaration under Section 564(b)(1) of the Act, 21 U.S.C. section 360bbb-3(b)(1), unless the authorization is terminated or revoked.  Performed at [facility], [place] [address] [place], [place] [address]    POC Amphetamine UR 07/12/2021 None Detected  NONE DETECTED (Cut Off Level 1000 ng/mL) Final   POC Secobarbital (BAR) 07/12/2021 None Detected  NONE DETECTED (Cut Off Level 300 ng/mL) Final   POC Buprenorphine (BUP) 07/12/2021 None Detected  NONE DETECTED (Cut Off Level 10 ng/mL)  Final   POC Oxazepam (BZO) 07/12/2021 Positive (A)  NONE DETECTED (Cut Off Level 300 ng/mL) Final   POC Cocaine UR 07/12/2021 None Detected  NONE DETECTED (Cut Off Level 300 ng/mL) Final   POC Methamphetamine UR 07/12/2021 None Detected  NONE DETECTED (Cut Off Level 1000 ng/mL) Final   POC Morphine 07/12/2021 None Detected  NONE DETECTED (Cut Off Level 300 ng/mL) Final   POC Methadone UR 07/12/2021 None Detected  NONE DETECTED (Cut Off Level 300 ng/mL) Final   POC Oxycodone UR 07/12/2021 None Detected  NONE DETECTED (Cut Off Level 100 ng/mL) Final   POC Marijuana UR 07/12/2021 None Detected  NONE DETECTED (Cut Off Level 50 ng/mL) Final   SARS Coronavirus 2 Ag  07/12/2021 Negative  Negative Preliminary   Preg Test, Ur 07/12/2021 NEGATIVE  NEGATIVE Final   Comment:        THE SENSITIVITY OF THIS METHODOLOGY IS >24 mIU/mL    SARSCOV2ONAVIRUS 2 AG 07/12/2021 NEGATIVE  NEGATIVE Final   Comment: (NOTE) SARS-CoV-2 antigen NOT DETECTED.   Negative results are presumptive.  Negative results do not preclude SARS-CoV-2 infection and should not be used as the sole basis for treatment or other patient management decisions, including infection  control decisions, particularly in the presence of clinical signs and  symptoms consistent with COVID-19, or in those who have been in contact with the virus.  Negative results must be combined with clinical observations, patient history, and epidemiological information. The expected result is Negative.  Fact Sheet for Patients: HandmadeRecipes.com.cy  Fact Sheet for Healthcare Providers: FuneralLife.at  This test is not yet approved or cleared by the [place] FDA and  has been authorized for detection and/or diagnosis of SARS-CoV-2 by FDA under an Emergency Use Authorization (EUA).  This EUA will remain in effect (meaning this test can be used) for the duration of  the COV                          ID-19 declaration under Section 564(b)(1) of the Act, 21 U.S.C. section 360bbb-3(b)(1), unless the authorization is terminated or revoked sooner.    No results displayed because visit has over 200 results.

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
## 2021-08-02 NOTE — Discharge Instructions (Addendum)
To see which pharmacy near you is the CHEAPEST for certain medications, please use GoodRx. It is free website and has a free phone app.      Also consider looking at [facility] $4.68 or [facility] $0.32 prescription list. Both are free to view if googled [facility]" and "[facility] $7 prescription". These are set prices, no insurance required.   For issues with sleep, please use this free app for insomnia called CBT-I. Let your doctors and therapists know so they can help with extra tips and tricks or for guidance and accountability. NO ADDS on the app.     For therapy outside the hospital, please ask for these specific types of therapy: CBT, DBT, Trauma therapy   Please make regular appointments with an outpatient psychiatrist and other doctors once you leave the hospital.    Suicide hotline: 988 Emergency: 911    Based on your needs and the presenting issue, resources for Partial Hospitalization Program (PHP)/Intensive Outpatient (IOP) and Dialectical Behavioral Therapy (DBT) have been provided below for your convenience.  IT IS IMPERATIVE THAT YOU FOLLOW THROUGH WITH TREATMENT RECOMMENDATIONS NO MORE THAN 5-7 DAYS FROM THE DATE OF DISCHARGE TO MITIGATE FURTHER RISK TO YOUR SAFETY AND MENTAL WELL-BEING.  This includes following up with your [employer] on no later than 04 August 2021.  [facility] for PHP/IOP [address] [place], [place], [address] [phone] phone  [facility] Tra[facility] Therapy [address] [place], [place], [address] [phone] phone [phone] fax  [facility] [address], [place], [address] [phone] phone  [facility]for DBT [address] [place], [place], [address] [phone] phone [phone] fax  [facility] [address] [place], [place], [address] [phone] phone [phone] fax  [facility] DBT [address] [place], [place], [address] [phone] phone [phone] fax  [facility] [address] [place], [place], [address] [phone] phone [phone] fax  [facility] [address] [place], [place], [address] [phone] phone [phone] fax

## 2021-08-03 ENCOUNTER — Encounter (HOSPITAL_COMMUNITY): Payer: Self-pay | Admitting: Student

## 2021-08-03 ENCOUNTER — Other Ambulatory Visit: Payer: Self-pay

## 2021-08-03 DIAGNOSIS — F4310 Post-traumatic stress disorder, unspecified: Secondary | ICD-10-CM

## 2021-08-03 DIAGNOSIS — F603 Borderline personality disorder: Secondary | ICD-10-CM | POA: Diagnosis not present

## 2021-08-03 DIAGNOSIS — F411 Generalized anxiety disorder: Secondary | ICD-10-CM

## 2021-08-03 DIAGNOSIS — F314 Bipolar disorder, current episode depressed, severe, without psychotic features: Secondary | ICD-10-CM | POA: Diagnosis not present

## 2021-08-03 LAB — CBC WITH DIFFERENTIAL/PLATELET
Abs Immature Granulocytes: 0.03 10*3/uL (ref 0.00–0.07)
Basophils Absolute: 0 10*3/uL (ref 0.0–0.1)
Basophils Relative: 0 %
Eosinophils Absolute: 0 10*3/uL (ref 0.0–0.5)
Eosinophils Relative: 0 %
HCT: 38.8 % (ref 36.0–46.0)
Hemoglobin: 12.8 g/dL (ref 12.0–15.0)
Immature Granulocytes: 0 %
Lymphocytes Relative: 31 %
Lymphs Abs: 2.8 10*3/uL (ref 0.7–4.0)
MCH: 26.6 pg (ref 26.0–34.0)
MCHC: 33 g/dL (ref 30.0–36.0)
MCV: 80.5 fL (ref 80.0–100.0)
Monocytes Absolute: 0.5 10*3/uL (ref 0.1–1.0)
Monocytes Relative: 5 %
Neutro Abs: 5.7 10*3/uL (ref 1.7–7.7)
Neutrophils Relative %: 64 %
Platelets: 345 10*3/uL (ref 150–400)
RBC: 4.82 MIL/uL (ref 3.87–5.11)
RDW: 13.9 % (ref 11.5–15.5)
WBC: 9.1 10*3/uL (ref 4.0–10.5)
nRBC: 0 % (ref 0.0–0.2)

## 2021-08-03 LAB — COMPREHENSIVE METABOLIC PANEL
ALT: 20 U/L (ref 0–44)
AST: 21 U/L (ref 15–41)
Albumin: 4.3 g/dL (ref 3.5–5.0)
Alkaline Phosphatase: 74 U/L (ref 38–126)
Anion gap: 11 (ref 5–15)
BUN: 15 mg/dL (ref 6–20)
CO2: 23 mmol/L (ref 22–32)
Calcium: 10 mg/dL (ref 8.9–10.3)
Chloride: 104 mmol/L (ref 98–111)
Creatinine, Ser: 0.75 mg/dL (ref 0.44–1.00)
GFR, Estimated: >60 mL/min (ref >60)
Glucose, Bld: 90 mg/dL (ref 70–99)
Potassium: 3.8 mmol/L (ref 3.5–5.1)
Sodium: 138 mmol/L (ref 135–145)
Total Bilirubin: 0.5 mg/dL (ref 0.3–1.2)
Total Protein: 7.1 g/dL (ref 6.5–8.1)

## 2021-08-03 LAB — POC SARS CORONAVIRUS 2 AG: SARSCOV2ONAVIRUS 2 AG: NEGATIVE

## 2021-08-03 LAB — POC SARS CORONAVIRUS 2 AG -  ED: SARS Coronavirus 2 Ag: NEGATIVE

## 2021-08-03 LAB — POCT URINE DRUG SCREEN - MANUAL ENTRY (I-SCREEN)
POC Amphetamine UR: POSITIVE — AB
POC Buprenorphine (BUP): NOT DETECTED
POC Cocaine UR: NOT DETECTED
POC Marijuana UR: NOT DETECTED
POC Methadone UR: NOT DETECTED
POC Methamphetamine UR: NOT DETECTED
POC Morphine: NOT DETECTED
POC Oxazepam (BZO): POSITIVE — AB
POC Oxycodone UR: NOT DETECTED
POC Secobarbital (BAR): NOT DETECTED

## 2021-08-03 LAB — RESP PANEL BY RT-PCR (FLU A&B, COVID) ARPGX2
Influenza A by PCR: NEGATIVE
Influenza B by PCR: NEGATIVE
SARS Coronavirus 2 by RT PCR: NEGATIVE

## 2021-08-03 LAB — ETHANOL: Alcohol, Ethyl (B): <10 mg/dL (ref <10)

## 2021-08-03 LAB — POCT PREGNANCY, URINE: Preg Test, Ur: NEGATIVE

## 2021-08-03 MED ORDER — IBUPROFEN 400 MG PO TABS
400.0000 mg | ORAL_TABLET | Freq: Once | ORAL | Status: AC
  Administered 2021-08-03: 400 mg via ORAL
  Filled 2021-08-03: qty 1

## 2021-08-03 NOTE — ED Notes (Signed)
Discharge instructions provided and Pt stated understanding. Pt alert, orient and ambulatory prior to d/c from facility. Personal belongings returned from locker number 41. Pt escorted to the front lobby in order to use her phone or the phone on the wall to call for transportation. Unable to provide a taxi voucher per LCSW. Pt stated she could not use a bus pass because it was to far to tote her bag from the bus stop to her home. Stated she did not know any numbers to call without using her phone. Therefore, she was d/c to the lobby to use her phone at that time. Left voicemail for Lysbeth Galas to call facility back in a situation of this kind. Safety maintained.
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
## 2021-08-03 NOTE — ED Notes (Signed)
Patient resting quietly in bed with eyes closed, Respirations equal and unlabored, NAD noted. Routine safety checks conducted according to facility protocol. Will continue to monitor for safety.
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
## 2021-08-03 NOTE — BH Assessment (Signed)
LCSW Progress Note   Per Merrily Brittle, DO, this pt does not require psychiatric hospitalization at this time.  Pt is psychiatrically cleared.  Discharge instructions include resources for DBT, PHP/IOP services.  EDP Merrily Brittle, DO, has been notified.  Omelia Blackwater, MSW, [place] [phone] or [phone]

## 2021-08-03 NOTE — ED Notes (Signed)
Pt accepted morning meds w/o difficulty. Stated that she was hearing voices, only laughing at her at this time. Denied SI/HI to this nurse. Breakfast provided. Safety maintained and will continue to monitor.

## 2021-08-03 NOTE — ED Notes (Signed)
Patient discharged.

## 2021-08-12 DIAGNOSIS — J45909 Unspecified asthma, uncomplicated: Secondary | ICD-10-CM | POA: Diagnosis not present

## 2021-08-12 DIAGNOSIS — R5383 Other fatigue: Secondary | ICD-10-CM | POA: Diagnosis not present

## 2021-08-12 DIAGNOSIS — E7800 Pure hypercholesterolemia, unspecified: Secondary | ICD-10-CM | POA: Diagnosis not present

## 2021-08-12 DIAGNOSIS — M129 Arthropathy, unspecified: Secondary | ICD-10-CM | POA: Diagnosis not present

## 2021-08-12 DIAGNOSIS — Z79899 Other long term (current) drug therapy: Secondary | ICD-10-CM | POA: Diagnosis not present

## 2021-08-12 DIAGNOSIS — M170 Bilateral primary osteoarthritis of knee: Secondary | ICD-10-CM | POA: Diagnosis not present

## 2021-08-12 DIAGNOSIS — R000 Tachycardia, unspecified: Secondary | ICD-10-CM | POA: Diagnosis not present

## 2021-08-12 DIAGNOSIS — Z131 Encounter for screening for diabetes mellitus: Secondary | ICD-10-CM | POA: Diagnosis not present

## 2021-08-12 DIAGNOSIS — K219 Gastro-esophageal reflux disease without esophagitis: Secondary | ICD-10-CM | POA: Diagnosis not present

## 2021-08-17 ENCOUNTER — Telehealth (HOSPITAL_COMMUNITY): Payer: Self-pay

## 2021-08-17 NOTE — BH Assessment (Signed)
Care Management - Follow Up [facility] Discharges   Patient has been placed in an inpatient psychiatric hospital [facility]) on July 25, 2021.

## 2021-08-23 ENCOUNTER — Ambulatory Visit: Payer: Self-pay

## 2021-09-02 ENCOUNTER — Ambulatory Visit (HOSPITAL_COMMUNITY)
Admission: EM | Admit: 2021-09-02 | Discharge: 2021-09-04 | Disposition: A | Discharge status: Home / Self Care | Payer: PPO | Attending: Psychiatry | Admitting: Psychiatry

## 2021-09-02 DIAGNOSIS — Z20822 Contact with and (suspected) exposure to covid-19: Secondary | ICD-10-CM | POA: Insufficient documentation

## 2021-09-02 DIAGNOSIS — F419 Anxiety disorder, unspecified: Secondary | ICD-10-CM | POA: Insufficient documentation

## 2021-09-02 DIAGNOSIS — F603 Borderline personality disorder: Secondary | ICD-10-CM | POA: Insufficient documentation

## 2021-09-02 DIAGNOSIS — Z3202 Encounter for pregnancy test, result negative: Secondary | ICD-10-CM | POA: Insufficient documentation

## 2021-09-02 DIAGNOSIS — F250 Schizoaffective disorder, bipolar type: Secondary | ICD-10-CM | POA: Diagnosis not present

## 2021-09-02 DIAGNOSIS — Z9151 Personal history of suicidal behavior: Secondary | ICD-10-CM | POA: Insufficient documentation

## 2021-09-02 DIAGNOSIS — R45851 Suicidal ideations: Secondary | ICD-10-CM | POA: Diagnosis not present

## 2021-09-02 DIAGNOSIS — Z765 Malingerer [conscious simulation]: Secondary | ICD-10-CM

## 2021-09-02 DIAGNOSIS — Z79899 Other long term (current) drug therapy: Secondary | ICD-10-CM | POA: Diagnosis not present

## 2021-09-02 LAB — POCT PREGNANCY, URINE: Preg Test, Ur: NEGATIVE

## 2021-09-02 LAB — POC SARS CORONAVIRUS 2 AG: SARSCOV2ONAVIRUS 2 AG: NEGATIVE

## 2021-09-02 MED ORDER — MAGNESIUM HYDROXIDE 400 MG/5ML PO SUSP: 30.0000 mL | Freq: Every day | ORAL | Status: DC | PRN

## 2021-09-02 MED ORDER — ACETAMINOPHEN 325 MG PO TABS: 650.0000 mg | ORAL_TABLET | Freq: Four times a day (QID) | ORAL | Status: DC | PRN

## 2021-09-02 MED ORDER — ALUM & MAG HYDROXIDE-SIMETH 200-200-20 MG/5ML PO SUSP: 30.0000 mL | ORAL | Status: DC | PRN
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
## 2021-09-02 NOTE — ED Provider Notes (Signed)
[facility] Continuous Assessment Admission H&P  Date: 09/02/21 Patient Name: [name] MRN: [id number] Chief Complaint:  Chief Complaint  Patient presents with   Suicidal      Diagnoses:  Final diagnoses:  Malingering  Suicidal ideation    HPI: [name], 31 y.o female, with a PHI of borderline personality disorder, suicide attempts bipolar disorder depression.  Presented voluntarily to [facility], complaining of feeling stressed out and suicidal ideation.  Per the patient she is stressed out does not have any food and think that she needs to live in a group home.  According to patient she punched her hand in the wall today because of all the stress.  Patient is a frequent visitor to the ED and a chronic suicidal ideation pt.    TTS traige notes,  She presents voluntarily tonight via [employer] reporting suicidal ideation with plan to overdose on medications. She describes feeling overwhelmed by stressors resulting in her punching a glass window tonight. She says she cannot manage her responsibilities, she has no support, and she wants to go to a group home. She says she has no food and has not eat in a week. She says she contacted her community support team and they said they would initiate the process for group home placement. She denies homicidal ideation, psychotic symptoms, or substance use.  Face-to-face observation of patient, patient is alert and oriented x 4, speech is clear maintain eye contact.  Mood is labile affect congruent with mood.  Patient jumps from one topic to the other.  Patient reports she is suicidal and she punched a wall today.  Patient denies HI, AVH, or paranoia.  Per the patient she is just stressed out about life she has no food at the house, patient stated she wants to live in a group home.  Recommend overnight observation  PHQ 2-9:  [facility] ED from 06/26/2021 in [facility] from 05/01/2021 in [facility] ED from 02/03/2020 in [facility]  Thoughts that you would be better off dead, or of hurting yourself in some way Several days Nearly every day Nearly every day  [Phreesia 02/03/2020]  PHQ-9 Total Score '9 17 27       '$ Flowsheet Row ED from 08/01/2021 in [facility] from 07/29/2021 in [facility]mission (Discharged) from 07/25/2021 in [facility]  C-SSRS RISK CATEGORY Error: Q3, 4, or 5 should not be populated when Q2 is No High Risk No Risk        Total Time spent with patient: 20 minutes  Musculoskeletal  Strength & Muscle Tone: within normal limits Gait & Station: normal Patient leans: N/A  Psychiatric Specialty Exam  Presentation General Appearance: Casual  Eye Contact:Fair  Speech:Clear and Coherent  Speech Volume:Normal  Handedness:Ambidextrous   Mood and Affect  Mood:Anxious  Affect:Congruent   Thought Process  Thought Processes:Linear  Descriptions of Associations:Tangential  Orientation:Full (Time, Place and Person)  Thought Content:Rumination  Diagnosis of Schizophrenia or Schizoaffective disorder in past: No   Hallucinations:Hallucinations: None  Ideas of Reference:None  Suicidal Thoughts:Suicidal Thoughts: Yes, Passive SI Active Intent and/or Plan: Without Intent SI Passive Intent and/or Plan: Without Intent  Homicidal Thoughts:Homicidal Thoughts: No   Sensorium  Memory:Immediate Fair  Judgment:Poor  Insight:Fair   Executive Functions  Concentration:Fair  Attention Span:Fair  [place]  Activity  Psychomotor Activity:Psychomotor Activity: Normal   Assets  Assets:Desire for Improvement   Sleep  Sleep:Sleep: Fair   Nutritional Assessment (For OBS and FBC admissions only) Has the patient had a weight  loss or gain of 10 pounds or more in the last 3 months?: No Has the patient had a decrease in food intake/or appetite?: No Does the patient have dental problems?: No Does the patient have eating habits or behaviors that may be indicators of an eating disorder including binging or inducing vomiting?: No Has the patient recently lost weight without trying?: 0 Has the patient been eating poorly because of a decreased appetite?: 0 Malnutrition Screening Tool Score: 0    Physical Exam HENT:     Head: Normocephalic.     Nose: Nose normal.  Cardiovascular:     Rate and Rhythm: Normal rate.  Pulmonary:     Effort: Pulmonary effort is normal.  Musculoskeletal:        General: Normal range of motion.     Cervical back: Normal range of motion.  Skin:    General: Skin is warm.  Neurological:     General: No focal deficit present.     Mental Status: She is alert.  Psychiatric:        Mood and Affect: Mood normal.        Behavior: Behavior normal.        Thought Content: Thought content normal.        Judgment: Judgment normal.    Review of Systems  Constitutional: Negative.   HENT: Negative.    Eyes: Negative.   Respiratory: Negative.    Cardiovascular: Negative.   Gastrointestinal: Negative.   Genitourinary: Negative.   Musculoskeletal: Negative.   Skin: Negative.   Neurological: Negative.   Endo/Heme/Allergies: Negative.   Psychiatric/Behavioral:  Positive for suicidal ideas.     Blood pressure 103/70, pulse 94, temperature 98.3 F (36.8 C), temperature source Oral, resp. rate 18, SpO2 98 %. There is no height or weight on file to calculate BMI.  Past Psychiatric History: Borderline personality disorder, depression, bipolar disorder, suicidal ideation  Is the patient at risk to self? Yes  Has the patient been a risk to self in the past 6 months? Yes .    Has the patient been a risk to self within the distant past? Yes   Is the patient a risk to others? No   Has the  patient been a risk to others in the past 6 months? No   Has the patient been a risk to others within the distant past? 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    Chronic constipation    Depression    Drug-seeking behavior    Essential tremor    Headache    Overdose of acetaminophen 07/2017   and other meds   Personality disorder ([place])    Schizo-affective psychosis ([place])    Schizoaffective disorder, bipolar type ([place])    Seizures ([place])    Last seizure December 2017   Sleep apnea     Past Surgical History:  Procedure Laterality Date   MOUTH SURGERY  2009 or 2010    Family History:  Family History  Problem Relation Age of Onset   Mental illness Father    Asthma Father  PDD Brother    Seizures Brother     Social History:  Social History   Socioeconomic History   Marital status: Widowed    Spouse name: Not on file   Number of children: 0   Years of education: Not on file   Highest education level: Not on file  Occupational History   Occupation: disability  Tobacco Use   Smoking status: Former    Packs/day: 0.00    Types: Cigarettes   Smokeless tobacco: Never   Tobacco comments:    Smoked for 2  years age [age]  Vaping Use   Vaping Use: Never used  Substance and Sexual Activity   Alcohol use: No    Alcohol/week: 1.0 standard drink of alcohol    Types: 1 Standard drinks or equivalent per week    Comment: denies at this time   Drug use: No    Comment: History of cocaine use at age 87 for 4 months   Sexual activity: Not Currently    Birth control/protection: None  Other Topics Concern   Not on file  Social History Narrative   Marital status: Widowed      Children: daughter      Lives: with boyfriend, in two story home      Employment:  Disability      Tobacco: quit smoking; smoked for two years.      Alcohol ;none       Drugs: none   Has not traveled outside of the country.   Right handed         Social Determinants of Health   Financial Resource Strain: Not on file  Food Insecurity: Not on file  Transportation Needs: Not on file  Physical Activity: Not on file  Stress: Not on file  Social Connections: Not on file  Intimate Partner Violence: Not on file    SDOH:  SDOH Screenings   Alcohol Screen: Low Risk  (07/25/2021)   Alcohol Screen    Last Alcohol Screening Score (AUDIT): 0  Depression (PHQ2-9): Medium Risk (06/27/2021)   Depression (PHQ2-9)    PHQ-2 Score: 9  Financial Resource Strain: Not on file  Food Insecurity: Not on file  Housing: Not on file  Physical Activity: Not on file  Social Connections: Not on file  Stress: Not on file  Tobacco Use: Medium Risk (08/03/2021)   Patient History    Smoking Tobacco Use: Former    Smokeless Tobacco Use: Never    Passive Exposure: Not on file  Transportation Needs: Not on file    Last Labs:  Admission on 08/02/2021, Discharged on 08/03/2021  Component Date Value Ref Range Status   SARS Coronavirus 2 by RT PCR 08/03/2021 NEGATIVE  NEGATIVE Final   Comment: (NOTE) SARS-CoV-2 target nucleic acids are NOT DETECTED.  The SARS-CoV-2 RNA is generally detectable in upper respiratory specimens during the acute phase of infection. The lowest concentration of SARS-CoV-2 viral copies this assay can detect is 138 copies/mL. A negative result does not preclude SARS-Cov-2 infection and should not be used as the sole basis for treatment or other patient management decisions. A negative result may occur with  improper specimen collection/handling, submission of specimen other than nasopharyngeal swab, presence of viral mutation(s) within the areas targeted by this assay, and inadequate number of viral copies(<138 copies/mL). A negative result must be combined with clinical observations, patient history, and epidemiological information. The  expected result is Negative.  Fact Sheet for Patients:  EntrepreneurPulse.com.au  Fact Sheet for Healthcare  Providers:  IncredibleEmployment.be  This test is no                          t yet approved or cleared by the [place] and  has been authorized for detection and/or diagnosis of SARS-CoV-2 by FDA under an Emergency Use Authorization (EUA). This EUA will remain  in effect (meaning this test can be used) for the duration of the COVID-19 declaration under Section 564(b)(1) of the Act, 21 U.S.C.section 360bbb-3(b)(1), unless the authorization is terminated  or revoked sooner.       Influenza A by PCR 08/03/2021 NEGATIVE  NEGATIVE Final   Influenza B by PCR 08/03/2021 NEGATIVE  NEGATIVE Final   Comment: (NOTE) The Xpert Xpress SARS-CoV-2/FLU/RSV plus assay is intended as an aid in the diagnosis of influenza from Nasopharyngeal swab specimens and should not be used as a sole basis for treatment. Nasal washings and aspirates are unacceptable for Xpert Xpress SARS-CoV-2/FLU/RSV testing.  Fact Sheet for Patients: EntrepreneurPulse.com.au  Fact Sheet for Healthcare Providers: IncredibleEmployment.be  This test is not yet approved or cleared by the [place] FDA and has been authorized for detection and/or diagnosis of SARS-CoV-2 by FDA under an Emergency Use Authorization (EUA). This EUA will remain in effect (meaning this test can be used) for the duration of the COVID-19 declaration under Section 564(b)(1) of the Act, 21 U.S.C. section 360bbb-3(b)(1), unless the authorization is terminated or revoked.  Performed at [facility], [place] [address] [place], [place] [address]    WBC 08/02/2021 9.1  4.0 - 10.5 K/uL Final   RBC 08/02/2021 4.82  3.87 - 5.11 MIL/uL Final   Hemoglobin 08/02/2021 12.8  12.0 - 15.0 g/dL Final   HCT 08/02/2021 38.8  36.0 - 46.0 % Final   MCV 08/02/2021 80.5   80.0 - 100.0 fL Final   MCH 08/02/2021 26.6  26.0 - 34.0 pg Final   MCHC 08/02/2021 33.0  30.0 - 36.0 g/dL Final   RDW 08/02/2021 13.9  11.5 - 15.5 % Final   Platelets 08/02/2021 345  150 - 400 K/uL Final   nRBC 08/02/2021 0.0  0.0 - 0.2 % Final   Neutrophils Relative % 08/02/2021 64  % Final   Neutro Abs 08/02/2021 5.7  1.7 - 7.7 K/uL Final   Lymphocytes Relative 08/02/2021 31  % Final   Lymphs Abs 08/02/2021 2.8  0.7 - 4.0 K/uL Final   Monocytes Relative 08/02/2021 5  % Final   Monocytes Absolute 08/02/2021 0.5  0.1 - 1.0 K/uL Final   Eosinophils Relative 08/02/2021 0  % Final   Eosinophils Absolute 08/02/2021 0.0  0.0 - 0.5 K/uL Final   Basophils Relative 08/02/2021 0  % Final   Basophils Absolute 08/02/2021 0.0  0.0 - 0.1 K/uL Final   Immature Granulocytes 08/02/2021 0  % Final   Abs Immature Granulocytes 08/02/2021 0.03  0.00 - 0.07 K/uL Final   Performed at [facility], [place] [address] [place], [place] [address]   Sodium 08/02/2021 138  135 - 145 mmol/L Final   Potassium 08/02/2021 3.8  3.5 - 5.1 mmol/L Final   Chloride 08/02/2021 104  98 - 111 mmol/L Final   CO2 08/02/2021 23  22 - 32 mmol/L Final   Glucose, Bld 08/02/2021 90  70 - 99 mg/dL Final   Glucose reference range applies only to samples taken after fasting for at least 8 hours.   BUN 08/02/2021 15  6 - 20 mg/dL Final   Creatinine, Ser 08/02/2021 0.75  0.44 - 1.00 mg/dL Final   Calcium 08/02/2021 10.0  8.9 - 10.3 mg/dL Final   Total Protein 08/02/2021 7.1  6.5 - 8.1 g/dL Final   Albumin 08/02/2021 4.3  3.5 - 5.0 g/dL Final   AST 08/02/2021 21  15 - 41 U/L Final   ALT 08/02/2021 20  0 - 44 U/L Final   Alkaline Phosphatase 08/02/2021 74  38 - 126 U/L Final   Total Bilirubin 08/02/2021 0.5  0.3 - 1.2 mg/dL Final   GFR, Estimated 08/02/2021 >60  >60 mL/min Final   Comment: (NOTE) Calculated using the CKD-EPI Creatinine Equation (2021)    Anion gap 08/02/2021 11  5 - 15 Final   Performed at [facility] [address], [place], [place] [address]   Alcohol, Ethyl (B) 08/02/2021 <10  <10 mg/dL Final   Comment: (NOTE) Lowest detectable limit for serum alcohol is 10 mg/dL.  For medical purposes only. Performed at [facility], [place] [address], [place], [place] [address]    POC Amphetamine UR 08/03/2021 Positive (A)  NONE DETECTED (Cut Off Level 1000 ng/mL) Final   POC Secobarbital (BAR) 08/03/2021 None Detected  NONE DETECTED (Cut Off Level 300 ng/mL) Final   POC Buprenorphine (BUP) 08/03/2021 None Detected  NONE DETECTED (Cut Off Level 10 ng/mL) Final   POC Oxazepam (BZO) 08/03/2021 Positive (A)  NONE DETECTED (Cut Off Level 300 ng/mL) Final   POC Cocaine UR 08/03/2021 None Detected  NONE DETECTED (Cut Off Level 300 ng/mL) Final   POC Methamphetamine UR 08/03/2021 None Detected  NONE DETECTED (Cut Off Level 1000 ng/mL) Final   POC Morphine 08/03/2021 None Detected  NONE DETECTED (Cut Off Level 300 ng/mL) Final   POC Methadone UR 08/03/2021 None Detected  NONE DETECTED (Cut Off Level 300 ng/mL) Final   POC Oxycodone UR 08/03/2021 None Detected  NONE DETECTED (Cut Off Level 100 ng/mL) Final   POC Marijuana UR 08/03/2021 None Detected  NONE DETECTED (Cut Off Level 50 ng/mL) Final   SARS Coronavirus 2 Ag 08/03/2021 Negative  Negative Preliminary   SARSCOV2ONAVIRUS 2 AG 08/03/2021 NEGATIVE  NEGATIVE Final   Comment: (NOTE) SARS-CoV-2 antigen NOT DETECTED.   Negative results are presumptive.  Negative results do not preclude SARS-CoV-2 infection and should not be used as the sole basis for treatment or other patient management decisions, including infection  control decisions, particularly in the presence of clinical signs and  symptoms consistent with COVID-19, or in those who have been in contact with the virus.  Negative results must be combined with clinical observations, patient history, and epidemiological information. The expected result is Negative.  Fact Sheet for Patients:  HandmadeRecipes.com.cy  Fact Sheet for Healthcare Providers: FuneralLife.at  This test is not yet approved or cleared by the [place] FDA and  has been authorized for detection and/or diagnosis of SARS-CoV-2 by FDA under an Emergency Use Authorization (EUA).  This EUA will remain in effect (meaning this test can be used) for the duration of  the COV                          ID-19 declaration under Section 564(b)(1) of the Act, 21 U.S.C. section 360bbb-3(b)(1), unless the authorization is terminated or revoked sooner.     Preg Test, Ur 08/03/2021 NEGATIVE  NEGATIVE Final   Comment:        THE  SENSITIVITY OF THIS METHODOLOGY IS >24 mIU/mL   Admission on 08/01/2021, Discharged on 08/01/2021  Component Date Value Ref Range Status   Sodium 08/01/2021 137  135 - 145 mmol/L Final   Potassium 08/01/2021 3.7  3.5 - 5.1 mmol/L Final   Chloride 08/01/2021 107  98 - 111 mmol/L Final   CO2 08/01/2021 20 (L)  22 - 32 mmol/L Final   Glucose, Bld 08/01/2021 92  70 - 99 mg/dL Final   Glucose reference range applies only to samples taken after fasting for at least 8 hours.   BUN 08/01/2021 13  6 - 20 mg/dL Final   Creatinine, Ser 08/01/2021 0.63  0.44 - 1.00 mg/dL Final   Calcium 08/01/2021 9.3  8.9 - 10.3 mg/dL Final   GFR, Estimated 08/01/2021 >60  >60 mL/min Final   Comment: (NOTE) Calculated using the CKD-EPI Creatinine Equation (2021)    Anion gap 08/01/2021 10  5 - 15 Final   Performed at [facility], [place] [address], [place], [place] [address]   WBC 08/01/2021 8.0  4.0 - 10.5 K/uL Final   RBC 08/01/2021 4.84  3.87 - 5.11 MIL/uL Final   Hemoglobin 08/01/2021 12.4  12.0 - 15.0 g/dL Final   HCT 08/01/2021 40.1  36.0 - 46.0 % Final   MCV 08/01/2021 82.9  80.0 - 100.0 fL Final   MCH 08/01/2021 25.6 (L)  26.0 - 34.0 pg Final   MCHC 08/01/2021 30.9  30.0 - 36.0 g/dL Final   RDW 08/01/2021 14.0  11.5 - 15.5 % Final   Platelets  08/01/2021 305  150 - 400 K/uL Final   nRBC 08/01/2021 0.0  0.0 - 0.2 % Final   Performed at [facility] [address] [place], [place] [address]   I-stat hCG, quantitative 08/01/2021 <5.0  <5 mIU/mL Final   Comment 3 08/01/2021          Final   Comment:   GEST. AGE      CONC.  (mIU/mL)   <=1 WEEK        5 - 50     2 WEEKS       50 - 500     3 WEEKS       100 - 10,000     4 WEEKS     1,000 - 30,000        FEMALE AND NON-PREGNANT FEMALE:     LESS THAN 5 mIU/mL    Troponin I (High Sensitivity) 08/01/2021 2  <18 ng/L Final   Comment: (NOTE) Elevated high sensitivity troponin I (hsTnI) values and significant  changes across serial measurements may suggest ACS but many other  chronic and acute conditions are known to elevate hsTnI results.  Refer to the "Links" section for chest pain algorithms and additional  guidance. Performed at [facility], [place] [address] [place], [place] [address]    Color, Urine 08/01/2021 YELLOW  YELLOW Final   APPearance 08/01/2021 HAZY (A)  CLEAR Final   Specific Gravity, Urine 08/01/2021 1.018  1.005 - 1.030 Final   pH 08/01/2021 5.0  5.0 - 8.0 Final   Glucose, UA 08/01/2021 NEGATIVE  NEGATIVE mg/dL Final   Hgb urine dipstick 08/01/2021 NEGATIVE  NEGATIVE Final   Bilirubin Urine 08/01/2021 NEGATIVE  NEGATIVE Final   Ketones, ur 08/01/2021 NEGATIVE  NEGATIVE mg/dL Final   Protein, ur 08/01/2021 NEGATIVE  NEGATIVE mg/dL Final   Nitrite 08/01/2021 NEGATIVE  NEGATIVE Final   Leukocytes,Ua 08/01/2021 NEGATIVE  NEGATIVE Final   Performed at [facility], [place] [address] [place], [place] [address]  Admission on 07/30/2021, Discharged on 07/31/2021  Component Date Value Ref Range Status   Preg Test, Ur 07/30/2021 NEGATIVE  NEGATIVE Final   Comment:        THE SENSITIVITY OF THIS METHODOLOGY IS >24 mIU/mL   Admission on 07/29/2021, Discharged on 07/30/2021  Component Date Value Ref Range Status   Glucose-Capillary 07/29/2021 90  70 - 99  mg/dL Final   Glucose reference range applies only to samples taken after fasting for at least 8 hours.   Sodium 07/29/2021 140  135 - 145 mmol/L Final   Potassium 07/29/2021 3.9  3.5 - 5.1 mmol/L Final   Chloride 07/29/2021 112 (H)  98 - 111 mmol/L Final   CO2 07/29/2021 18 (L)  22 - 32 mmol/L Final   Glucose, Bld 07/29/2021 94  70 - 99 mg/dL Final   Glucose reference range applies only to samples taken after fasting for at least 8 hours.   BUN 07/29/2021 10  6 - 20 mg/dL Final   Creatinine, Ser 07/29/2021 0.68  0.44 - 1.00 mg/dL Final   Calcium 07/29/2021 8.4 (L)  8.9 - 10.3 mg/dL Final   Total Protein 07/29/2021 5.8 (L)  6.5 - 8.1 g/dL Final   Albumin 07/29/2021 3.3 (L)  3.5 - 5.0 g/dL Final   AST 07/29/2021 17  15 - 41 U/L Final   ALT 07/29/2021 18  0 - 44 U/L Final   Alkaline Phosphatase 07/29/2021 57  38 - 126 U/L Final   Total Bilirubin 07/29/2021 0.4  0.3 - 1.2 mg/dL Final   GFR, Estimated 07/29/2021 >60  >60 mL/min Final   Comment: (NOTE) Calculated using the CKD-EPI Creatinine Equation (2021)    Anion gap 07/29/2021 10  5 - 15 Final   Performed at [facility], [place] [address], [place], [place] [address]   Salicylate Lvl 54/00/8676 <7.0 (L)  7.0 - 30.0 mg/dL Final   Performed at [facility] [address], [place], [place] [address]   Acetaminophen (Tylenol), Serum 07/29/2021 <10 (L)  10 - 30 ug/mL Final   Comment: (NOTE) Therapeutic concentrations vary significantly. A range of 10-30 ug/mL  may be an effective concentration for many patients. However, some  are best treated at concentrations outside of this range. Acetaminophen concentrations >150 ug/mL at 4 hours after ingestion  and >50 ug/mL at 12 hours after ingestion are often associated with  toxic reactions.  Performed at [facility], [place] [address], [place], [place] [address]    Alcohol, Ethyl (B) 07/29/2021 <10  <10 mg/dL Final   Comment: (NOTE) Lowest detectable limit for serum alcohol  is 10 mg/dL.  For medical purposes only. Performed at [facility], [place] [address], [place], [place] [address]    Opiates 07/29/2021 NONE DETECTED  NONE DETECTED Final   Cocaine 07/29/2021 NONE DETECTED  NONE DETECTED Final   Benzodiazepines 07/29/2021 NONE DETECTED  NONE DETECTED Final   Amphetamines 07/29/2021 NONE DETECTED  NONE DETECTED Final   Tetrahydrocannabinol 07/29/2021 NONE DETECTED  NONE DETECTED Final   Barbiturates 07/29/2021 NONE DETECTED  NONE DETECTED Final   Comment: (NOTE) DRUG SCREEN FOR MEDICAL PURPOSES ONLY.  IF CONFIRMATION IS NEEDED FOR ANY PURPOSE, NOTIFY LAB WITHIN 5 DAYS.  LOWEST DETECTABLE LIMITS FOR URINE DRUG SCREEN Drug Class  Cutoff (ng/mL) Amphetamine and metabolites    1000 Barbiturate and metabolites    200 Benzodiazepine                 962 Tricyclics and metabolites     300 Opiates and metabolites        300 Cocaine and metabolites        300 THC                            50 Performed at [facility], [place] [address], [place], [place] [address]    WBC 07/29/2021 6.7  4.0 - 10.5 K/uL Final   RBC 07/29/2021 4.13  3.87 - 5.11 MIL/uL Final   Hemoglobin 07/29/2021 10.8 (L)  12.0 - 15.0 g/dL Final   HCT 07/29/2021 34.7 (L)  36.0 - 46.0 % Final   MCV 07/29/2021 84.0  80.0 - 100.0 fL Final   MCH 07/29/2021 26.2  26.0 - 34.0 pg Final   MCHC 07/29/2021 31.1  30.0 - 36.0 g/dL Final   RDW 07/29/2021 14.3  11.5 - 15.5 % Final   Platelets 07/29/2021 236  150 - 400 K/uL Final   nRBC 07/29/2021 0.0  0.0 - 0.2 % Final   Neutrophils Relative % 07/29/2021 61  % Final   Neutro Abs 07/29/2021 4.1  1.7 - 7.7 K/uL Final   Lymphocytes Relative 07/29/2021 33  % Final   Lymphs Abs 07/29/2021 2.2  0.7 - 4.0 K/uL Final   Monocytes Relative 07/29/2021 5  % Final   Monocytes Absolute 07/29/2021 0.3  0.1 - 1.0 K/uL Final   Eosinophils Relative 07/29/2021 1  % Final   Eosinophils Absolute 07/29/2021 0.1  0.0 - 0.5 K/uL Final    Basophils Relative 07/29/2021 0  % Final   Basophils Absolute 07/29/2021 0.0  0.0 - 0.1 K/uL Final   Immature Granulocytes 07/29/2021 0  % Final   Abs Immature Granulocytes 07/29/2021 0.02  0.00 - 0.07 K/uL Final   Performed at [facility], [place] [address] [place], [place] [address]   Color, Urine 07/29/2021 YELLOW  YELLOW Final   APPearance 07/29/2021 CLOUDY (A)  CLEAR Final   Specific Gravity, Urine 07/29/2021 1.005  1.005 - 1.030 Final   pH 07/29/2021 6.0  5.0 - 8.0 Final   Glucose, UA 07/29/2021 NEGATIVE  NEGATIVE mg/dL Final   Hgb urine dipstick 07/29/2021 NEGATIVE  NEGATIVE Final   Bilirubin Urine 07/29/2021 NEGATIVE  NEGATIVE Final   Ketones, ur 07/29/2021 NEGATIVE  NEGATIVE mg/dL Final   Protein, ur 07/29/2021 NEGATIVE  NEGATIVE mg/dL Final   Nitrite 07/29/2021 NEGATIVE  NEGATIVE Final   Leukocytes,Ua 07/29/2021 MODERATE (A)  NEGATIVE Final   RBC / HPF 07/29/2021 6-10  0 - 5 RBC/hpf Final   WBC, UA 07/29/2021 6-10  0 - 5 WBC/hpf Final   Bacteria, UA 07/29/2021 MANY (A)  NONE SEEN Final   Squamous Epithelial / LPF 07/29/2021 6-10  0 - 5 Final   Mucus 07/29/2021 PRESENT   Final   Amorphous Crystal 07/29/2021 PRESENT   Final   Performed at [facility], [place] [address], [place], [place] [address]   SARS Coronavirus 2 by RT PCR 07/30/2021 NEGATIVE  NEGATIVE Final   Comment: (NOTE) SARS-CoV-2 target nucleic acids are NOT DETECTED.  The SARS-CoV-2 RNA is generally detectable in upper respiratory specimens during the acute phase of infection. The lowest concentration of SARS-CoV-2 viral copies this assay  can detect is 138 copies/mL. A negative result does not preclude SARS-Cov-2 infection and should not be used as the sole basis for treatment or other patient management decisions. A negative result may occur with  improper specimen collection/handling, submission of specimen other than nasopharyngeal swab, presence of viral mutation(s) within the areas targeted by this  assay, and inadequate number of viral copies(<138 copies/mL). A negative result must be combined with clinical observations, patient history, and epidemiological information. The expected result is Negative.  Fact Sheet for Patients:  EntrepreneurPulse.com.au  Fact Sheet for Healthcare Providers:  IncredibleEmployment.be  This test is no                          t yet approved or cleared by the [place] FDA and  has been authorized for detection and/or diagnosis of SARS-CoV-2 by FDA under an Emergency Use Authorization (EUA). This EUA will remain  in effect (meaning this test can be used) for the duration of the COVID-19 declaration under Section 564(b)(1) of the Act, 21 U.S.C.section 360bbb-3(b)(1), unless the authorization is terminated  or revoked sooner.       Influenza A by PCR 07/30/2021 NEGATIVE  NEGATIVE Final   Influenza B by PCR 07/30/2021 NEGATIVE  NEGATIVE Final   Comment: (NOTE) The Xpert Xpress SARS-CoV-2/FLU/RSV plus assay is intended as an aid in the diagnosis of influenza from Nasopharyngeal swab specimens and should not be used as a sole basis for treatment. Nasal washings and aspirates are unacceptable for Xpert Xpress SARS-CoV-2/FLU/RSV testing.  Fact Sheet for Patients: EntrepreneurPulse.com.au  Fact Sheet for Healthcare Providers: IncredibleEmployment.be  This test is not yet approved or cleared by the [place] FDA and has been authorized for detection and/or diagnosis of SARS-CoV-2 by FDA under an Emergency Use Authorization (EUA). This EUA will remain in effect (meaning this test can be used) for the duration of the COVID-19 declaration under Section 564(b)(1) of the Act, 21 U.S.C. section 360bbb-3(b)(1), unless the authorization is terminated or revoked.  Performed at [facility], [place] [address] [place], [place] [address]   Admission on 07/20/2021,  Discharged on 07/25/2021  Component Date Value Ref Range Status   Glucose-Capillary 07/20/2021 91  70 - 99 mg/dL Final   Glucose reference range applies only to samples taken after fasting for at least 8 hours.   Sodium 07/20/2021 140  135 - 145 mmol/L Final   Potassium 07/20/2021 3.9  3.5 - 5.1 mmol/L Final   Chloride 07/20/2021 111  98 - 111 mmol/L Final   CO2 07/20/2021 21 (L)  22 - 32 mmol/L Final   Glucose, Bld 07/20/2021 94  70 - 99 mg/dL Final   Glucose reference range applies only to samples taken after fasting for at least 8 hours.   BUN 07/20/2021 16  6 - 20 mg/dL Final   Creatinine, Ser 07/20/2021 0.61  0.44 - 1.00 mg/dL Final   Calcium 07/20/2021 8.9  8.9 - 10.3 mg/dL Final   Total Protein 07/20/2021 6.3 (L)  6.5 - 8.1 g/dL Final   Albumin 07/20/2021 3.6  3.5 - 5.0 g/dL Final   AST 07/20/2021 17  15 - 41 U/L Final   ALT 07/20/2021 15  0 - 44 U/L Final   Alkaline Phosphatase 07/20/2021 53  38 - 126 U/L Final   Total Bilirubin 07/20/2021 0.5  0.3 - 1.2 mg/dL Final   GFR, Estimated 07/20/2021 >60  >60 mL/min Final   Comment: (NOTE) Calculated  using the CKD-EPI Creatinine Equation (2021)    Anion gap 07/20/2021 8  5 - 15 Final   Performed at [facility], [place] [address], [place], [place] [address]   Salicylate Lvl 75/91/6384 <7.0 (L)  7.0 - 30.0 mg/dL Final   Performed at [place] [address], [place], [place] [address]   Acetaminophen (Tylenol), Serum 07/20/2021 <10 (L)  10 - 30 ug/mL Final   Comment: (NOTE) Therapeutic concentrations vary significantly. A range of 10-30 ug/mL  may be an effective concentration for many patients. However, some  are best treated at concentrations outside of this range. Acetaminophen concentrations >150 ug/mL at 4 hours after ingestion  and >50 ug/mL at 12 hours after ingestion are often associated with  toxic reactions.  Performed at [facility], [place] [address], [place], [place] [address]    Alcohol, Ethyl (B)  07/20/2021 <10  <10 mg/dL Final   Comment: (NOTE) Lowest detectable limit for serum alcohol is 10 mg/dL.  For medical purposes only. Performed at [facility], [place] [address] [place],  [address]    Opiates 07/21/2021 NONE DETECTED  NONE DETECTED Final   Cocaine 07/21/2021 NONE DETECTED  NONE DETECTED Final   Benzodiazepines 07/21/2021 NONE DETECTED  NONE DETECTED Final   Amphetamines 07/21/2021 NONE DETECTED  NONE DETECTED Final   Tetrahydrocannabinol 07/21/2021 NONE DETECTED  NONE DETECTED Final   Barbiturates 07/21/2021 NONE DETECTED  NONE DETECTED Final   Comment: (NOTE) DRUG SCREEN FOR MEDICAL PURPOSES ONLY.  IF CONFIRMATION IS NEEDED FOR ANY PURPOSE, NOTIFY LAB WITHIN 5 DAYS.  LOWEST DETECTABLE LIMITS FOR URINE DRUG SCREEN Drug Class                     Cutoff (ng/mL) Amphetamine and metabolites    1000 Barbiturate and metabolites    200 Benzodiazepine                 030 Tricyclics and metabolites     300 Opiates and metabolites        300 Cocaine and metabolites        300 THC                            50 Performed at [facility], [place] [address] [place], [place] [address]    WBC 07/20/2021 8.3  4.0 - 10.5 K/uL Final   RBC 07/20/2021 4.09  3.87 - 5.11 MIL/uL Final   Hemoglobin 07/20/2021 10.6 (L)  12.0 - 15.0 g/dL Final   HCT 07/20/2021 33.9 (L)  36.0 - 46.0 % Final   MCV 07/20/2021 82.9  80.0 - 100.0 fL Final   MCH 07/20/2021 25.9 (L)  26.0 - 34.0 pg Final   MCHC 07/20/2021 31.3  30.0 - 36.0 g/dL Final   RDW 07/20/2021 14.4  11.5 - 15.5 % Final   Platelets 07/20/2021 272  150 - 400 K/uL Final   nRBC 07/20/2021 0.0  0.0 - 0.2 % Final   Neutrophils Relative % 07/20/2021 61  % Final   Neutro Abs 07/20/2021 5.0  1.7 - 7.7 K/uL Final   Lymphocytes Relative 07/20/2021 32  % Final   Lymphs Abs 07/20/2021 2.7  0.7 - 4.0 K/uL Final   Monocytes Relative 07/20/2021 6  % Final   Monocytes Absolute 07/20/2021 0.5  0.1 - 1.0 K/uL Final   Eosinophils  Relative 07/20/2021 1  % Final  Eosinophils Absolute 07/20/2021 0.1  0.0 - 0.5 K/uL Final   Basophils Relative 07/20/2021 0  % Final   Basophils Absolute 07/20/2021 0.0  0.0 - 0.1 K/uL Final   Immature Granulocytes 07/20/2021 0  % Final   Abs Immature Granulocytes 07/20/2021 0.03  0.00 - 0.07 K/uL Final   Performed at [facility], [place] [address], [place], [place] [address]   I-stat hCG, quantitative 07/20/2021 <5.0  <5 mIU/mL Final   Comment 3 07/20/2021          Final   Comment:   GEST. AGE      CONC.  (mIU/mL)   <=1 WEEK        5 - 50     2 WEEKS       50 - 500     3 WEEKS       100 - 10,000     4 WEEKS     1,000 - 30,000        FEMALE AND NON-PREGNANT FEMALE:     LESS THAN 5 mIU/mL    Sodium 07/21/2021 140  135 - 145 mmol/L Final   Potassium 07/21/2021 3.8  3.5 - 5.1 mmol/L Final   Chloride 07/21/2021 112 (H)  98 - 111 mmol/L Final   CO2 07/21/2021 22  22 - 32 mmol/L Final   Glucose, Bld 07/21/2021 106 (H)  70 - 99 mg/dL Final   Glucose reference range applies only to samples taken after fasting for at least 8 hours.   BUN 07/21/2021 13  6 - 20 mg/dL Final   Creatinine, Ser 07/21/2021 0.61  0.44 - 1.00 mg/dL Final   Calcium 07/21/2021 8.5 (L)  8.9 - 10.3 mg/dL Final   Total Protein 07/21/2021 5.6 (L)  6.5 - 8.1 g/dL Final   Albumin 07/21/2021 3.2 (L)  3.5 - 5.0 g/dL Final   AST 07/21/2021 16  15 - 41 U/L Final   ALT 07/21/2021 12  0 - 44 U/L Final   Alkaline Phosphatase 07/21/2021 50  38 - 126 U/L Final   Total Bilirubin 07/21/2021 0.7  0.3 - 1.2 mg/dL Final   GFR, Estimated 07/21/2021 >60  >60 mL/min Final   Comment: (NOTE) Calculated using the CKD-EPI Creatinine Equation (2021)    Anion gap 07/21/2021 6  5 - 15 Final   Performed at [facility], [place] [address], [place], [place] [address]   WBC 07/21/2021 6.9  4.0 - 10.5 K/uL Final   RBC 07/21/2021 3.77 (L)  3.87 - 5.11 MIL/uL Final   Hemoglobin 07/21/2021 9.8 (L)  12.0 - 15.0 g/dL Final   HCT 07/21/2021 31.7  (L)  36.0 - 46.0 % Final   MCV 07/21/2021 84.1  80.0 - 100.0 fL Final   MCH 07/21/2021 26.0  26.0 - 34.0 pg Final   MCHC 07/21/2021 30.9  30.0 - 36.0 g/dL Final   RDW 07/21/2021 14.6  11.5 - 15.5 % Final   Platelets 07/21/2021 242  150 - 400 K/uL Final   nRBC 07/21/2021 0.0  0.0 - 0.2 % Final   Performed at [place] [address], [place], [place] [address]   SARS Coronavirus 2 07/24/2021 NEGATIVE  NEGATIVE Final   Comment: (NOTE) SARS-CoV-2 target nucleic acids are NOT DETECTED.  The SARS-CoV-2 RNA is generally detectable in upper and lower respiratory specimens during the acute phase of infection. Negative results do not preclude SARS-CoV-2 infection, do not rule out co-infections with other pathogens, and  should not be used as the sole basis for treatment or other patient management decisions. Negative results must be combined with clinical observations, patient history, and epidemiological information. The expected result is Negative.  Fact Sheet for Patients: SugarRoll.be  Fact Sheet for Healthcare Providers: https://www.woods-mathews.com/  This test is not yet approved or cleared by the [place] FDA and  has been authorized for detection and/or diagnosis of SARS-CoV-2 by FDA under an Emergency Use Authorization (EUA). This EUA will remain  in effect (meaning this test can be used) for the duration of the COVID-19 declaration under Se                          ction 564(b)(1) of the Act, 21 U.S.C. section 360bbb-3(b)(1), unless the authorization is terminated or revoked sooner.  Performed at [facility], [place] [address] [place], [place] [address]   Admission on 07/19/2021, Discharged on 07/20/2021  Component Date Value Ref Range Status   SARS Coronavirus 2 by RT PCR 07/19/2021 NEGATIVE  NEGATIVE Final   Comment: (NOTE) SARS-CoV-2 target nucleic acids are NOT DETECTED.  The SARS-CoV-2 RNA is generally  detectable in upper respiratory specimens during the acute phase of infection. The lowest concentration of SARS-CoV-2 viral copies this assay can detect is 138 copies/mL. A negative result does not preclude SARS-Cov-2 infection and should not be used as the sole basis for treatment or other patient management decisions. A negative result may occur with  improper specimen collection/handling, submission of specimen other than nasopharyngeal swab, presence of viral mutation(s) within the areas targeted by this assay, and inadequate number of viral copies(<138 copies/mL). A negative result must be combined with clinical observations, patient history, and epidemiological information. The expected result is Negative.  Fact Sheet for Patients:  EntrepreneurPulse.com.au  Fact Sheet for Healthcare Providers:  IncredibleEmployment.be  This test is no                          t yet approved or cleared by the [place] FDA and  has been authorized for detection and/or diagnosis of SARS-CoV-2 by FDA under an Emergency Use Authorization (EUA). This EUA will remain  in effect (meaning this test can be used) for the duration of the COVID-19 declaration under Section 564(b)(1) of the Act, 21 U.S.C.section 360bbb-3(b)(1), unless the authorization is terminated  or revoked sooner.       Influenza A by PCR 07/19/2021 NEGATIVE  NEGATIVE Final   Influenza B by PCR 07/19/2021 NEGATIVE  NEGATIVE Final   Comment: (NOTE) The Xpert Xpress SARS-CoV-2/FLU/RSV plus assay is intended as an aid in the diagnosis of influenza from Nasopharyngeal swab specimens and should not be used as a sole basis for treatment. Nasal washings and aspirates are unacceptable for Xpert Xpress SARS-CoV-2/FLU/RSV testing.  Fact Sheet for Patients: EntrepreneurPulse.com.au  Fact Sheet for Healthcare Providers: IncredibleEmployment.be  This test is not  yet approved or cleared by the [place] FDA and has been authorized for detection and/or diagnosis of SARS-CoV-2 by FDA under an Emergency Use Authorization (EUA). This EUA will remain in effect (meaning this test can be used) for the duration of the COVID-19 declaration under Section 564(b)(1) of the Act, 21 U.S.C. section 360bbb-3(b)(1), unless the authorization is terminated or revoked.  Performed at [facility], [place] [address] [place], [place] [address]    POC Amphetamine UR 07/20/2021 None Detected  NONE DETECTED (Cut Off  Level 1000 ng/mL) Final   POC Secobarbital (BAR) 07/20/2021 None Detected  NONE DETECTED (Cut Off Level 300 ng/mL) Final   POC Buprenorphine (BUP) 07/20/2021 None Detected  NONE DETECTED (Cut Off Level 10 ng/mL) Final   POC Oxazepam (BZO) 07/20/2021 Positive (A)  NONE DETECTED (Cut Off Level 300 ng/mL) Final   POC Cocaine UR 07/20/2021 None Detected  NONE DETECTED (Cut Off Level 300 ng/mL) Final   POC Methamphetamine UR 07/20/2021 None Detected  NONE DETECTED (Cut Off Level 1000 ng/mL) Final   POC Morphine 07/20/2021 None Detected  NONE DETECTED (Cut Off Level 300 ng/mL) Final   POC Methadone UR 07/20/2021 None Detected  NONE DETECTED (Cut Off Level 300 ng/mL) Final   POC Oxycodone UR 07/20/2021 None Detected  NONE DETECTED (Cut Off Level 100 ng/mL) Final   POC Marijuana UR 07/20/2021 None Detected  NONE DETECTED (Cut Off Level 50 ng/mL) Final   SARSCOV2ONAVIRUS 2 AG 07/20/2021 NEGATIVE  NEGATIVE Final   Comment: (NOTE) SARS-CoV-2 antigen NOT DETECTED.   Negative results are presumptive.  Negative results do not preclude SARS-CoV-2 infection and should not be used as the sole basis for treatment or other patient management decisions, including infection  control decisions, particularly in the presence of clinical signs and  symptoms consistent with COVID-19, or in those who have been in contact with the virus.  Negative results must be combined  with clinical observations, patient history, and epidemiological information. The expected result is Negative.  Fact Sheet for Patients: HandmadeRecipes.com.cy  Fact Sheet for Healthcare Providers: FuneralLife.at  This test is not yet approved or cleared by the [place] FDA and  has been authorized for detection and/or diagnosis of SARS-CoV-2 by FDA under an Emergency Use Authorization (EUA).  This EUA will remain in effect (meaning this test can be used) for the duration of  the COV                          ID-19 declaration under Section 564(b)(1) of the Act, 21 U.S.C. section 360bbb-3(b)(1), unless the authorization is terminated or revoked sooner.     Preg Test, Ur 07/20/2021 NEGATIVE  NEGATIVE Final   Comment:        THE SENSITIVITY OF THIS METHODOLOGY IS >24 mIU/mL   Admission on 07/18/2021, Discharged on 07/19/2021  Component Date Value Ref Range Status   SARS Coronavirus 2 by RT PCR 07/18/2021 NEGATIVE  NEGATIVE Final   Comment: (NOTE) SARS-CoV-2 target nucleic acids are NOT DETECTED.  The SARS-CoV-2 RNA is generally detectable in upper respiratory specimens during the acute phase of infection. The lowest concentration of SARS-CoV-2 viral copies this assay can detect is 138 copies/mL. A negative result does not preclude SARS-Cov-2 infection and should not be used as the sole basis for treatment or other patient management decisions. A negative result may occur with  improper specimen collection/handling, submission of specimen other than nasopharyngeal swab, presence of viral mutation(s) within the areas targeted by this assay, and inadequate number of viral copies(<138 copies/mL). A negative result must be combined with clinical observations, patient history, and epidemiological information. The expected result is Negative.  Fact Sheet for Patients:  EntrepreneurPulse.com.au  Fact Sheet for  Healthcare Providers:  IncredibleEmployment.be  This test is no                          t yet approved or cleared by the [place] and  has been authorized for detection and/or diagnosis of SARS-CoV-2 by FDA under an Emergency Use Authorization (EUA). This EUA will remain  in effect (meaning this test can be used) for the duration of the COVID-19 declaration under Section 564(b)(1) of the Act, 21 U.S.C.section 360bbb-3(b)(1), unless the authorization is terminated  or revoked sooner.       Influenza A by PCR 07/18/2021 NEGATIVE  NEGATIVE Final   Influenza B by PCR 07/18/2021 NEGATIVE  NEGATIVE Final   Comment: (NOTE) The Xpert Xpress SARS-CoV-2/FLU/RSV plus assay is intended as an aid in the diagnosis of influenza from Nasopharyngeal swab specimens and should not be used as a sole basis for treatment. Nasal washings and aspirates are unacceptable for Xpert Xpress SARS-CoV-2/FLU/RSV testing.  Fact Sheet for Patients: EntrepreneurPulse.com.au  Fact Sheet for Healthcare Providers: IncredibleEmployment.be  This test is not yet approved or cleared by the [place] FDA and has been authorized for detection and/or diagnosis of SARS-CoV-2 by FDA under an Emergency Use Authorization (EUA). This EUA will remain in effect (meaning this test can be used) for the duration of the COVID-19 declaration under Section 564(b)(1) of the Act, 21 U.S.C. section 360bbb-3(b)(1), unless the authorization is terminated or revoked.  Performed at [facility], [place] [address], [place], [place] [address]    Sodium 07/18/2021 138  135 - 145 mmol/L Final   Potassium 07/18/2021 3.8  3.5 - 5.1 mmol/L Final   Chloride 07/18/2021 109  98 - 111 mmol/L Final   CO2 07/18/2021 22  22 - 32 mmol/L Final   Glucose, Bld 07/18/2021 99  70 - 99 mg/dL Final   Glucose reference range applies only to samples taken after fasting for at least 8 hours.    BUN 07/18/2021 15  6 - 20 mg/dL Final   Creatinine, Ser 07/18/2021 0.60  0.44 - 1.00 mg/dL Final   Calcium 07/18/2021 9.2  8.9 - 10.3 mg/dL Final   Total Protein 07/18/2021 6.8  6.5 - 8.1 g/dL Final   Albumin 07/18/2021 3.9  3.5 - 5.0 g/dL Final   AST 07/18/2021 16  15 - 41 U/L Final   ALT 07/18/2021 16  0 - 44 U/L Final   Alkaline Phosphatase 07/18/2021 56  38 - 126 U/L Final   Total Bilirubin 07/18/2021 0.3  0.3 - 1.2 mg/dL Final   GFR, Estimated 07/18/2021 >60  >60 mL/min Final   Comment: (NOTE) Calculated using the CKD-EPI Creatinine Equation (2021)    Anion gap 07/18/2021 7  5 - 15 Final   Performed at [facility] [address], [place], [place] [address]   Alcohol, Ethyl (B) 07/18/2021 <10  <10 mg/dL Final   Comment: (NOTE) Lowest detectable limit for serum alcohol is 10 mg/dL.  For medical purposes only. Performed at [facility], [place] [address], [place], [place] [address]    Opiates 07/18/2021 NONE DETECTED  NONE DETECTED Final   Cocaine 07/18/2021 NONE DETECTED  NONE DETECTED Final   Benzodiazepines 07/18/2021 NONE DETECTED  NONE DETECTED Final   Amphetamines 07/18/2021 NONE DETECTED  NONE DETECTED Final   Tetrahydrocannabinol 07/18/2021 NONE DETECTED  NONE DETECTED Final   Barbiturates 07/18/2021 NONE DETECTED  NONE DETECTED Final   Comment: (NOTE) DRUG SCREEN FOR MEDICAL PURPOSES ONLY.  IF CONFIRMATION IS NEEDED FOR ANY PURPOSE, NOTIFY LAB WITHIN 5 DAYS.  LOWEST DETECTABLE LIMITS FOR URINE DRUG SCREEN Drug Class  Cutoff (ng/mL) Amphetamine and metabolites    1000 Barbiturate and metabolites    200 Benzodiazepine                 884 Tricyclics and metabolites     300 Opiates and metabolites        300 Cocaine and metabolites        300 THC                            50 Performed at [facility], [place] [address], [place], [place] [address]    WBC 07/18/2021 8.4  4.0 - 10.5 K/uL Final   RBC 07/18/2021 4.46  3.87 - 5.11  MIL/uL Final   Hemoglobin 07/18/2021 11.7 (L)  12.0 - 15.0 g/dL Final   HCT 07/18/2021 37.5  36.0 - 46.0 % Final   MCV 07/18/2021 84.1  80.0 - 100.0 fL Final   MCH 07/18/2021 26.2  26.0 - 34.0 pg Final   MCHC 07/18/2021 31.2  30.0 - 36.0 g/dL Final   RDW 07/18/2021 14.3  11.5 - 15.5 % Final   Platelets 07/18/2021 239  150 - 400 K/uL Final   REPEATED TO VERIFY   nRBC 07/18/2021 0.0  0.0 - 0.2 % Final   Neutrophils Relative % 07/18/2021 58  % Final   Neutro Abs 07/18/2021 5.0  1.7 - 7.7 K/uL Final   Lymphocytes Relative 07/18/2021 34  % Final   Lymphs Abs 07/18/2021 2.8  0.7 - 4.0 K/uL Final   Monocytes Relative 07/18/2021 5  % Final   Monocytes Absolute 07/18/2021 0.4  0.1 - 1.0 K/uL Final   Eosinophils Relative 07/18/2021 1  % Final   Eosinophils Absolute 07/18/2021 0.0  0.0 - 0.5 K/uL Final   Basophils Relative 07/18/2021 1  % Final   Basophils Absolute 07/18/2021 0.0  0.0 - 0.1 K/uL Final   Immature Granulocytes 07/18/2021 1  % Final   Abs Immature Granulocytes 07/18/2021 0.04  0.00 - 0.07 K/uL Final   Performed at [facility], [place] [address] [place], [place] [address]   I-stat hCG, quantitative 07/18/2021 <5.0  <5 mIU/mL Final   Comment 3 07/18/2021          Final   Comment:   GEST. AGE      CONC.  (mIU/mL)   <=1 WEEK        5 - 50     2 WEEKS       50 - 500     3 WEEKS       100 - 10,000     4 WEEKS     1,000 - 30,000        FEMALE AND NON-PREGNANT FEMALE:     LESS THAN 5 mIU/mL   Admission on 07/15/2021, Discharged on 07/17/2021  Component Date Value Ref Range Status   SARS Coronavirus 2 by RT PCR 07/15/2021 NEGATIVE  NEGATIVE Final   Comment: (NOTE) SARS-CoV-2 target nucleic acids are NOT DETECTED.  The SARS-CoV-2 RNA is generally detectable in upper respiratory specimens during the acute phase of infection. The lowest concentration of SARS-CoV-2 viral copies this assay can detect is 138 copies/mL. A negative result does not preclude SARS-Cov-2 infection and  should not be used as the sole basis for treatment or other patient management decisions. A negative result may occur with  improper specimen collection/handling, submission of specimen other than nasopharyngeal swab, presence of viral mutation(s)  within the areas targeted by this assay, and inadequate number of viral copies(<138 copies/mL). A negative result must be combined with clinical observations, patient history, and epidemiological information. The expected result is Negative.  Fact Sheet for Patients:  EntrepreneurPulse.com.au  Fact Sheet for Healthcare Providers:  IncredibleEmployment.be  This test is no                          t yet approved or cleared by the [place] FDA and  has been authorized for detection and/or diagnosis of SARS-CoV-2 by FDA under an Emergency Use Authorization (EUA). This EUA will remain  in effect (meaning this test can be used) for the duration of the COVID-19 declaration under Section 564(b)(1) of the Act, 21 U.S.C.section 360bbb-3(b)(1), unless the authorization is terminated  or revoked sooner.       Influenza A by PCR 07/15/2021 NEGATIVE  NEGATIVE Final   Influenza B by PCR 07/15/2021 NEGATIVE  NEGATIVE Final   Comment: (NOTE) The Xpert Xpress SARS-CoV-2/FLU/RSV plus assay is intended as an aid in the diagnosis of influenza from Nasopharyngeal swab specimens and should not be used as a sole basis for treatment. Nasal washings and aspirates are unacceptable for Xpert Xpress SARS-CoV-2/FLU/RSV testing.  Fact Sheet for Patients: EntrepreneurPulse.com.au  Fact Sheet for Healthcare Providers: IncredibleEmployment.be  This test is not yet approved or cleared by the [place] FDA and has been authorized for detection and/or diagnosis of SARS-CoV-2 by FDA under an Emergency Use Authorization (EUA). This EUA will remain in effect (meaning this test can be used)  for the duration of the COVID-19 declaration under Section 564(b)(1) of the Act, 21 U.S.C. section 360bbb-3(b)(1), unless the authorization is terminated or revoked.  Performed at [facility], [place] [address], [place], [place] [address]    POC Amphetamine UR 07/15/2021 None Detected  NONE DETECTED (Cut Off Level 1000 ng/mL) Final   POC Secobarbital (BAR) 07/15/2021 None Detected  NONE DETECTED (Cut Off Level 300 ng/mL) Final   POC Buprenorphine (BUP) 07/15/2021 None Detected  NONE DETECTED (Cut Off Level 10 ng/mL) Final   POC Oxazepam (BZO) 07/15/2021 Positive (A)  NONE DETECTED (Cut Off Level 300 ng/mL) Final   POC Cocaine UR 07/15/2021 None Detected  NONE DETECTED (Cut Off Level 300 ng/mL) Final   POC Methamphetamine UR 07/15/2021 None Detected  NONE DETECTED (Cut Off Level 1000 ng/mL) Final   POC Morphine 07/15/2021 None Detected  NONE DETECTED (Cut Off Level 300 ng/mL) Final   POC Methadone UR 07/15/2021 None Detected  NONE DETECTED (Cut Off Level 300 ng/mL) Final   POC Oxycodone UR 07/15/2021 None Detected  NONE DETECTED (Cut Off Level 100 ng/mL) Final   POC Marijuana UR 07/15/2021 None Detected  NONE DETECTED (Cut Off Level 50 ng/mL) Final   SARSCOV2ONAVIRUS 2 AG 07/15/2021 NEGATIVE  NEGATIVE Final   Comment: (NOTE) SARS-CoV-2 antigen NOT DETECTED.   Negative results are presumptive.  Negative results do not preclude SARS-CoV-2 infection and should not be used as the sole basis for treatment or other patient management decisions, including infection  control decisions, particularly in the presence of clinical signs and  symptoms consistent with COVID-19, or in those who have been in contact with the virus.  Negative results must be combined with clinical observations, patient history, and epidemiological information. The expected result is Negative.  Fact Sheet for Patients: HandmadeRecipes.com.cy  Fact Sheet for Healthcare  Providers: FuneralLife.at  This test is not yet approved  or cleared by the [place] and  has been authorized for detection and/or diagnosis of SARS-CoV-2 by FDA under an Emergency Use Authorization (EUA).  This EUA will remain in effect (meaning this test can be used) for the duration of  the COV                          ID-19 declaration under Section 564(b)(1) of the Act, 21 U.S.C. section 360bbb-3(b)(1), unless the authorization is terminated or revoked sooner.     Preg Test, Ur 07/15/2021 NEGATIVE  NEGATIVE Final   Comment:        THE SENSITIVITY OF THIS METHODOLOGY IS >24 mIU/mL   Admission on 07/12/2021, Discharged on 07/14/2021  Component Date Value Ref Range Status   SARS Coronavirus 2 by RT PCR 07/12/2021 NEGATIVE  NEGATIVE Final   Comment: (NOTE) SARS-CoV-2 target nucleic acids are NOT DETECTED.  The SARS-CoV-2 RNA is generally detectable in upper respiratory specimens during the acute phase of infection. The lowest concentration of SARS-CoV-2 viral copies this assay can detect is 138 copies/mL. A negative result does not preclude SARS-Cov-2 infection and should not be used as the sole basis for treatment or other patient management decisions. A negative result may occur with  improper specimen collection/handling, submission of specimen other than nasopharyngeal swab, presence of viral mutation(s) within the areas targeted by this assay, and inadequate number of viral copies(<138 copies/mL). A negative result must be combined with clinical observations, patient history, and epidemiological information. The expected result is Negative.  Fact Sheet for Patients:  EntrepreneurPulse.com.au  Fact Sheet for Healthcare Providers:  IncredibleEmployment.be  This test is no                          t yet approved or cleared by the [place] FDA and  has been authorized for detection and/or  diagnosis of SARS-CoV-2 by FDA under an Emergency Use Authorization (EUA). This EUA will remain  in effect (meaning this test can be used) for the duration of the COVID-19 declaration under Section 564(b)(1) of the Act, 21 U.S.C.section 360bbb-3(b)(1), unless the authorization is terminated  or revoked sooner.       Influenza A by PCR 07/12/2021 NEGATIVE  NEGATIVE Final   Influenza B by PCR 07/12/2021 NEGATIVE  NEGATIVE Final   Comment: (NOTE) The Xpert Xpress SARS-CoV-2/FLU/RSV plus assay is intended as an aid in the diagnosis of influenza from Nasopharyngeal swab specimens and should not be used as a sole basis for treatment. Nasal washings and aspirates are unacceptable for Xpert Xpress SARS-CoV-2/FLU/RSV testing.  Fact Sheet for Patients: EntrepreneurPulse.com.au  Fact Sheet for Healthcare Providers: IncredibleEmployment.be  This test is not yet approved or cleared by the [place] FDA and has been authorized for detection and/or diagnosis of SARS-CoV-2 by FDA under an Emergency Use Authorization (EUA). This EUA will remain in effect (meaning this test can be used) for the duration of the COVID-19 declaration under Section 564(b)(1) of the Act, 21 U.S.C. section 360bbb-3(b)(1), unless the authorization is terminated or revoked.  Performed at [facility], [place] [address] [place], [place] [address]    POC Amphetamine UR 07/12/2021 None Detected  NONE DETECTED (Cut Off Level 1000 ng/mL) Final   POC Secobarbital (BAR) 07/12/2021 None Detected  NONE DETECTED (Cut Off Level 300 ng/mL) Final   POC Buprenorphine (BUP) 07/12/2021 None Detected  NONE DETECTED (Cut Off Level 10 ng/mL)  Final   POC Oxazepam (BZO) 07/12/2021 Positive (A)  NONE DETECTED (Cut Off Level 300 ng/mL) Final   POC Cocaine UR 07/12/2021 None Detected  NONE DETECTED (Cut Off Level 300 ng/mL) Final   POC Methamphetamine UR 07/12/2021 None Detected  NONE DETECTED (Cut  Off Level 1000 ng/mL) Final   POC Morphine 07/12/2021 None Detected  NONE DETECTED (Cut Off Level 300 ng/mL) Final   POC Methadone UR 07/12/2021 None Detected  NONE DETECTED (Cut Off Level 300 ng/mL) Final   POC Oxycodone UR 07/12/2021 None Detected  NONE DETECTED (Cut Off Level 100 ng/mL) Final   POC Marijuana UR 07/12/2021 None Detected  NONE DETECTED (Cut Off Level 50 ng/mL) Final   SARS Coronavirus 2 Ag 07/12/2021 Negative  Negative Preliminary   Preg Test, Ur 07/12/2021 NEGATIVE  NEGATIVE Final   Comment:        THE SENSITIVITY OF THIS METHODOLOGY IS >24 mIU/mL    SARSCOV2ONAVIRUS 2 AG 07/12/2021 NEGATIVE  NEGATIVE Final   Comment: (NOTE) SARS-CoV-2 antigen NOT DETECTED.   Negative results are presumptive.  Negative results do not preclude SARS-CoV-2 infection and should not be used as the sole basis for treatment or other patient management decisions, including infection  control decisions, particularly in the presence of clinical signs and  symptoms consistent with COVID-19, or in those who have been in contact with the virus.  Negative results must be combined with clinical observations, patient history, and epidemiological information. The expected result is Negative.  Fact Sheet for Patients: HandmadeRecipes.com.cy  Fact Sheet for Healthcare Providers: FuneralLife.at  This test is not yet approved or cleared by the [place] FDA and  has been authorized for detection and/or diagnosis of SARS-CoV-2 by FDA under an Emergency Use Authorization (EUA).  This EUA will remain in effect (meaning this test can be used) for the duration of  the COV                          ID-19 declaration under Section 564(b)(1) of the Act, 21 U.S.C. section 360bbb-3(b)(1), unless the authorization is terminated or revoked sooner.    No results displayed because visit has over 200 results.    There may be more visits with results that are  not included.    Allergies: Bee venom, Coconut flavor, Fish allergy, Geodon [ziprasidone hcl], Haloperidol and related, Lithobid [lithium], Other, Roxicodone [oxycodone], Seroquel [quetiapine], Shellfish allergy, Phenergan [promethazine hcl], Prilosec [omeprazole], Sulfa antibiotics, Tegretol [carbamazepine], Prozac [fluoxetine], Tape, and Tylenol [acetaminophen]  PTA Medications: (Not in a hospital admission)   Medical Decision Making  Inpatient observation  Lab Orders         Resp Panel by RT-PCR (Flu A&B, Covid) Anterior Nasal Swab         CBC with Differential/Platelet         Comprehensive metabolic panel         Hemoglobin A1c         TSH         Pregnancy, urine         POCT Urine Drug Screen - (I-Screen)         POC SARS Coronavirus 2 Ag         Pregnancy, urine POC      Meds ordered this encounter  Medications   acetaminophen (TYLENOL) tablet 650 mg   alum & mag hydroxide-simeth (MAALOX/MYLANTA) 200-200-20 MG/5ML suspension 30 mL   magnesium hydroxide (MILK OF MAGNESIA) suspension 30 mL  Recommendations  Based on my evaluation the patient does not appear to have an emergency medical condition.  [name], NP 09/02/21  11:11 PM

## 2021-09-02 NOTE — Progress Notes (Signed)
   09/02/21 2240  [place] (Walk-ins at [facility] only)  What Is the Reason for Your Visit/Call Today? Pt has a diagnosis of schizoaffective disorder, bipolar type, autism, and personality disorder. She presents voluntarily tonight via [employer] reporting suicidal ideation with plan to overdose on medications. She describes feeling overwhelmed by stressors resulting in her punching a glass window tonight. She says she cannot manage her responsibilities, she has no support, and she wants to go to a group home. She says she has no food and has not eat in a week. She says she contacted her community support team and they said they would initiate the process for group home placement. She denies homicidal ideation, psychotic symptoms, or substance use.  How Long Has This Been Causing You Problems? > than 6 months  Have You Recently Had Any Thoughts About Hurting Yourself? Yes  How long ago did you have thoughts about hurting yourself? Currently  Are You Planning to Commit Suicide/Harm Yourself At This time? Yes  Have you Recently Had Thoughts About Hurting Someone [name]? No  How long ago did you have thoughts of harming others? NA  Are You Planning To Harm Someone At This Time? No  Are you currently experiencing any auditory, visual or other hallucinations? No  Please explain the hallucinations you are currently experiencing: Pt denies current auditory or visual hallucinations  Have You Used Any Alcohol or Drugs in the Past 24 Hours? No  What Did You Use and How Much? None  Do you have any current medical co-morbidities that require immediate attention? No  Please describe current medical co-morbidities that require immediate attention: None  Clinician description of patient physical appearance/behavior: Pt is dressed in shorts and t-shirt. She is alert and oriented x4. Pt speaks in a clear tone, at moderate volume and fast pace. Motor behavior appears normal. Eye contact is fair. Pt's mood is depressed  and anxious, affect is congruent with mood. Thought process is coherent and relevant. There is no indication Pt is currently responding to internal stimuli or experiencing delusional thought content. She is cooperative.  What Do You Feel Would Help You the Most Today? Treatment for Depression or other mood problem;Food Assistance;Medication(s)  If access to [facility] was not available, would you have sought care in the Emergency Department? Yes  Determination of Need Urgent (48 hours)  Options For Referral [facility];Group Home;Outpatient Therapy;Medication Management

## 2021-09-03 DIAGNOSIS — R45851 Suicidal ideations: Secondary | ICD-10-CM | POA: Diagnosis not present

## 2021-09-03 DIAGNOSIS — F603 Borderline personality disorder: Secondary | ICD-10-CM | POA: Diagnosis not present

## 2021-09-03 LAB — CBC WITH DIFFERENTIAL/PLATELET
Abs Immature Granulocytes: 0.02 10*3/uL (ref 0.00–0.07)
Basophils Absolute: 0 10*3/uL (ref 0.0–0.1)
Basophils Relative: 1 %
Eosinophils Absolute: 0.1 10*3/uL (ref 0.0–0.5)
Eosinophils Relative: 1 %
HCT: 38.4 % (ref 36.0–46.0)
Hemoglobin: 12.4 g/dL (ref 12.0–15.0)
Immature Granulocytes: 0 %
Lymphocytes Relative: 36 %
Lymphs Abs: 3.1 10*3/uL (ref 0.7–4.0)
MCH: 26.1 pg (ref 26.0–34.0)
MCHC: 32.3 g/dL (ref 30.0–36.0)
MCV: 80.7 fL (ref 80.0–100.0)
Monocytes Absolute: 0.5 10*3/uL (ref 0.1–1.0)
Monocytes Relative: 6 %
Neutro Abs: 4.8 10*3/uL (ref 1.7–7.7)
Neutrophils Relative %: 56 %
Platelets: 295 10*3/uL (ref 150–400)
RBC: 4.76 MIL/uL (ref 3.87–5.11)
RDW: 13.2 % (ref 11.5–15.5)
WBC: 8.5 10*3/uL (ref 4.0–10.5)
nRBC: 0 % (ref 0.0–0.2)

## 2021-09-03 LAB — POCT URINE DRUG SCREEN - MANUAL ENTRY (I-SCREEN)
POC Amphetamine UR: NOT DETECTED
POC Buprenorphine (BUP): NOT DETECTED
POC Cocaine UR: NOT DETECTED
POC Marijuana UR: NOT DETECTED
POC Methadone UR: NOT DETECTED
POC Methamphetamine UR: NOT DETECTED
POC Morphine: NOT DETECTED
POC Oxazepam (BZO): NOT DETECTED
POC Oxycodone UR: NOT DETECTED
POC Secobarbital (BAR): NOT DETECTED

## 2021-09-03 LAB — RESP PANEL BY RT-PCR (FLU A&B, COVID) ARPGX2
Influenza A by PCR: NEGATIVE
Influenza B by PCR: NEGATIVE
SARS Coronavirus 2 by RT PCR: NEGATIVE

## 2021-09-03 LAB — COMPREHENSIVE METABOLIC PANEL
ALT: 16 U/L (ref 0–44)
AST: 17 U/L (ref 15–41)
Albumin: 4.2 g/dL (ref 3.5–5.0)
Alkaline Phosphatase: 70 U/L (ref 38–126)
Anion gap: 7 (ref 5–15)
BUN: 6 mg/dL (ref 6–20)
CO2: 28 mmol/L (ref 22–32)
Calcium: 10.1 mg/dL (ref 8.9–10.3)
Chloride: 102 mmol/L (ref 98–111)
Creatinine, Ser: 0.72 mg/dL (ref 0.44–1.00)
GFR, Estimated: >60 mL/min (ref >60)
Glucose, Bld: 92 mg/dL (ref 70–99)
Potassium: 3.6 mmol/L (ref 3.5–5.1)
Sodium: 137 mmol/L (ref 135–145)
Total Bilirubin: 0.4 mg/dL (ref 0.3–1.2)
Total Protein: 7.5 g/dL (ref 6.5–8.1)

## 2021-09-03 LAB — HEMOGLOBIN A1C
Hgb A1c MFr Bld: 5 % (ref 4.8–5.6)
Mean Plasma Glucose: 96.8 mg/dL

## 2021-09-03 LAB — TSH: TSH: 2.382 u[IU]/mL (ref 0.350–4.500)

## 2021-09-03 MED ORDER — ESCITALOPRAM OXALATE 5 MG PO TABS
15.0000 mg | ORAL_TABLET | Freq: Every day | ORAL | Status: DC
  Administered 2021-09-04: 15 mg via ORAL
  Filled 2021-09-03: qty 1

## 2021-09-03 MED ORDER — ESCITALOPRAM OXALATE 10 MG PO TABS
10.0000 mg | ORAL_TABLET | Freq: Every day | ORAL | Status: DC
  Administered 2021-09-03: 10 mg via ORAL
  Filled 2021-09-03: qty 1

## 2021-09-03 MED ORDER — ESCITALOPRAM OXALATE 5 MG PO TABS
5.0000 mg | ORAL_TABLET | Freq: Once | ORAL | Status: AC
  Administered 2021-09-03: 5 mg via ORAL
  Filled 2021-09-03: qty 1

## 2021-09-03 MED ORDER — PRAZOSIN HCL 1 MG PO CAPS
1.0000 mg | ORAL_CAPSULE | Freq: Every day | ORAL | Status: DC
  Administered 2021-09-03: 1 mg via ORAL
  Filled 2021-09-03: qty 1

## 2021-09-03 MED ORDER — QUETIAPINE FUMARATE 50 MG PO TABS
50.0000 mg | ORAL_TABLET | Freq: Every day | ORAL | Status: DC
  Filled 2021-09-03: qty 1

## 2021-09-03 MED ORDER — LORAZEPAM 1 MG PO TABS: 2.0000 mg | ORAL_TABLET | ORAL | Status: DC | PRN

## 2021-09-03 MED ORDER — HYDROXYZINE HCL 10 MG PO TABS: 10.0000 mg | ORAL_TABLET | Freq: Three times a day (TID) | ORAL | Status: DC | PRN

## 2021-09-03 MED ORDER — PROPRANOLOL HCL 10 MG PO TABS
10.0000 mg | ORAL_TABLET | Freq: Two times a day (BID) | ORAL | Status: DC
  Administered 2021-09-03 – 2021-09-04 (×3): 10 mg via ORAL
  Filled 2021-09-03 (×3): qty 1

## 2021-09-03 MED ORDER — PANTOPRAZOLE SODIUM 40 MG PO TBEC
40.0000 mg | DELAYED_RELEASE_TABLET | Freq: Every day | ORAL | Status: DC
  Administered 2021-09-03 – 2021-09-04 (×2): 40 mg via ORAL
  Filled 2021-09-03 (×2): qty 1

## 2021-09-03 MED ORDER — IBUPROFEN 400 MG PO TABS
400.0000 mg | ORAL_TABLET | Freq: Four times a day (QID) | ORAL | Status: DC | PRN
  Administered 2021-09-03: 400 mg via ORAL
  Filled 2021-09-03: qty 1

## 2021-09-03 MED ORDER — OLANZAPINE 5 MG PO TBDP: 5.0000 mg | ORAL_TABLET | Freq: Three times a day (TID) | ORAL | Status: DC | PRN

## 2021-09-03 MED ORDER — DICLOFENAC SODIUM 1 % EX GEL
2.0000 g | Freq: Three times a day (TID) | CUTANEOUS | Status: DC | PRN
  Administered 2021-09-03 (×2): 2 g via TOPICAL

## 2021-09-03 MED ORDER — QUETIAPINE FUMARATE 25 MG PO TABS
12.5000 mg | ORAL_TABLET | Freq: Every day | ORAL | Status: DC
  Administered 2021-09-03 – 2021-09-04 (×2): 12.5 mg via ORAL
  Filled 2021-09-03 (×2): qty 1

## 2021-09-03 MED ORDER — CARIPRAZINE HCL 3 MG PO CAPS
3.0000 mg | ORAL_CAPSULE | Freq: Every day | ORAL | Status: DC
  Administered 2021-09-03 – 2021-09-04 (×2): 3 mg via ORAL
  Filled 2021-09-03 (×2): qty 1

## 2021-09-03 MED ORDER — MOMETASONE FURO-FORMOTEROL FUM 200-5 MCG/ACT IN AERO
2.0000 | INHALATION_SPRAY | Freq: Two times a day (BID) | RESPIRATORY_TRACT | Status: DC
  Administered 2021-09-03: 2 via RESPIRATORY_TRACT
  Filled 2021-09-03: qty 8.8

## 2021-09-03 MED ORDER — GABAPENTIN 300 MG PO CAPS
300.0000 mg | ORAL_CAPSULE | Freq: Three times a day (TID) | ORAL | Status: DC
  Administered 2021-09-03 – 2021-09-04 (×4): 300 mg via ORAL
  Filled 2021-09-03 (×4): qty 1

## 2021-09-03 MED ORDER — QUETIAPINE FUMARATE 25 MG PO TABS
25.0000 mg | ORAL_TABLET | Freq: Once | ORAL | Status: AC
  Administered 2021-09-03: 25 mg via ORAL
  Filled 2021-09-03: qty 1

## 2021-09-03 MED ORDER — ALBUTEROL SULFATE HFA 108 (90 BASE) MCG/ACT IN AERS: 2.0000 | INHALATION_SPRAY | Freq: Four times a day (QID) | RESPIRATORY_TRACT | Status: DC | PRN

## 2021-09-03 NOTE — ED Provider Notes (Addendum)
Behavioral Health Progress Note  Date and Time: 09/03/2021 11:48 AM Name: [name] MRN:  [id number]  Subjective:  [name], 31 y.o female, with a PHI of borderline personality disorder, suicide attempts bipolar disorder depression. Presented voluntarily to [facility], complaining of feeling stressed out and suicidal ideation. Per the patient she is stressed out does not have any food and think that she needs to live in a group home. According to patient she punched her hand in the wall today because of all the stress. Patient is a frequent visitor to the ED and a chronic suicidal ideation pt.    Patient seen and evaluated face-to-face by this provider, and chart reviewed. On evaluation, patient is alert and oriented x 4. Her thought process is logical and speech is clear and coherent at a decreased tone. Her mood is dysphoric and affect is congruent. She appears guarded this morning and forwards little information. Today, she continues to endorse suicidal ideations with a plan to overdose. She is unable to contract for safety. She denies HI. She denies auditory or visual hallucinations. She states that she last experienced auditory hallucinations last night when she was hearing a "radio murmurs."  There is no objective evidence that the patient is currently responding to internal or external stimuli. She reports good sleep, sleeping 5 hours per night. She reports a good appetite but states that she hasn't eaten in  a week because she did not have any food at home. She denies using illicit drugs. She reports occasional alcohol use and states that she last drank alcohol 2 weeks ago. She states that she follows up with [facility] for outpatient services. She states that she is currently prescribed Lexapro 15 mg p.o. daily, Inderal 10 mg p.o. twice daily, Vraylar 3 mg p.o. daily, Seroquel 50 mg nightly, Seroquel 12.5 mg p.o. in a.m., Voltaren gel, inhalers for asthma, Minipress 1 mg nightly and gabapentin  300 mg 3 times daily. She states that she is compliant with taking her medications and take her medications every day. She denies any questions or concerns at this time.   Diagnosis:  Final diagnoses:  Suicidal ideation  Borderline personality disorder ([place])    Total Time spent with patient: 30 minutes  Past Psychiatric History: History borderline personality disorder, depression, bipolar disorder, and suicidal ideation.  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 fragile X syndrome    Chronic constipation    Depression    Drug-seeking behavior    Essential tremor    Headache    Overdose of acetaminophen 07/2017   and other meds   Personality disorder ([place])    Schizo-affective psychosis ([place])    Schizoaffective disorder, bipolar type ([place])    Seizures ([place])    Last seizure December 2017   Sleep apnea     Past Surgical History:  Procedure Laterality Date   MOUTH SURGERY  2009 or 2010   Family History:  Family History  Problem Relation Age of Onset   Mental illness Father    Asthma Father    PDD Brother    Seizures Brother    Family Psychiatric  History: per chart, father has history of "mental illness." Social History:  Social History   Substance and Sexual Activity  Alcohol Use No   Alcohol/week: 1.0 standard drink of alcohol  Types: 1 Standard drinks or equivalent per week   Comment: denies at this time     Social History   Substance and Sexual Activity  Drug Use No   Comment: History of cocaine use at age 53 for 4 months    Social History   Socioeconomic History   Marital status: Widowed    Spouse name: Not on file   Number of children: 0   Years of education: Not on file   Highest education level: Not on file  Occupational History   Occupation: disability  Tobacco Use   Smoking status: Former    Packs/day: 0.00    Types:  Cigarettes   Smokeless tobacco: Never   Tobacco comments:    Smoked for 2  years age [age]  Vaping Use   Vaping Use: Never used  Substance and Sexual Activity   Alcohol use: No    Alcohol/week: 1.0 standard drink of alcohol    Types: 1 Standard drinks or equivalent per week    Comment: denies at this time   Drug use: No    Comment: History of cocaine use at age 7 for 4 months   Sexual activity: Not Currently    Birth control/protection: None  Other Topics Concern   Not on file  Social History Narrative   Marital status: Widowed      Children: daughter      Lives: with boyfriend, in two story home      Employment:  Disability      Tobacco: quit smoking; smoked for two years.      Alcohol ;none      Drugs: none   Has not traveled outside of the country.   Right handed         Social Determinants of Health   Financial Resource Strain: Not on file  Food Insecurity: Not on file  Transportation Needs: Not on file  Physical Activity: Not on file  Stress: Not on file  Social Connections: Not on file   SDOH:  SDOH Screenings   Alcohol Screen: Low Risk  (07/25/2021)   Alcohol Screen    Last Alcohol Screening Score (AUDIT): 0  Depression (PHQ2-9): Medium Risk (06/27/2021)   Depression (PHQ2-9)    PHQ-2 Score: 9  Financial Resource Strain: Not on file  Food Insecurity: Not on file  Housing: Not on file  Physical Activity: Not on file  Social Connections: Not on file  Stress: Not on file  Tobacco Use: Medium Risk (08/03/2021)   Patient History    Smoking Tobacco Use: Former    Smokeless Tobacco Use: Never    Passive Exposure: Not on file  Transportation Needs: Not on file   Additional Social History:    Pain Medications: See MAR Prescriptions: See MAR Over the Counter: See MAR History of alcohol / drug use?: No history of alcohol / drug abuse Longest period of sobriety (when/how long): N/A     Current Medications:  Current Facility-Administered Medications   Medication Dose Route Frequency Provider Last Rate Last Admin   acetaminophen (TYLENOL) tablet 650 mg  650 mg Oral Q6H PRN [name], NP       albuterol (VENTOLIN HFA) 108 (90 Base) MCG/ACT inhaler 2 puff  2 puff Inhalation Q6H PRN Loralei Radcliffe L, NP       alum & mag hydroxide-simeth (MAALOX/MYLANTA) 200-200-20 MG/5ML suspension 30 mL  30 mL Oral Q4H PRN [name], NP       cariprazine (VRAYLAR) capsule 3 mg  3 mg Oral Daily Helyn Schwan L, NP   3 mg at 09/03/21 1041   diclofenac Sodium (VOLTAREN) 1 % topical gel 2 g  2 g Topical TID PRN Porsche Noguchi L, NP       escitalopram (LEXAPRO) tablet 15 mg  15 mg Oral Daily Sherian Valenza L, NP       gabapentin (NEURONTIN) capsule 300 mg  300 mg Oral TID Jonathan Kirkendoll L, NP   300 mg at 09/03/21 1043   hydrOXYzine (ATARAX) tablet 10 mg  10 mg Oral TID PRN Charl Wellen L, NP       magnesium hydroxide (MILK OF MAGNESIA) suspension 30 mL  30 mL Oral Daily PRN [name], NP       mometasone-formoterol (DULERA) 200-5 MCG/ACT inhaler 2 puff  2 puff Inhalation BID Dezhane Staten L, NP   2 puff at 09/03/21 1050   pantoprazole (PROTONIX) EC tablet 40 mg  40 mg Oral Daily Kaian Fahs L, NP   40 mg at 09/03/21 1044   prazosin (MINIPRESS) capsule 1 mg  1 mg Oral QHS Izzac Rockett L, NP       propranolol (INDERAL) tablet 10 mg  10 mg Oral BID Willye Javier L, NP   10 mg at 09/03/21 1046   QUEtiapine (SEROQUEL) tablet 12.5 mg  12.5 mg Oral Daily Tkai Serfass L, NP   12.5 mg at 09/03/21 1045   QUEtiapine (SEROQUEL) tablet 50 mg  50 mg Oral QHS Banjamin Stovall L, NP       Current Outpatient Medications  Medication Sig Dispense Refill   albuterol (VENTOLIN HFA) 108 (90 Base) MCG/ACT inhaler Inhale 2 puffs into the lungs every 6 (six) hours as needed for wheezing or shortness of breath.     BIOTIN PO Take 3 capsules by mouth daily.     budesonide-formoterol (SYMBICORT) 160-4.5 MCG/ACT inhaler Inhale 2 puffs into the lungs 2 (two) times daily.      calcium carbonate (TUMS EX) 750 MG chewable tablet Chew 1,500 mg by mouth 2 (two) times daily as needed for heartburn.     cariprazine (VRAYLAR) 3 MG capsule Take 1 capsule (3 mg total) by mouth daily. 30 capsule    diclofenac Sodium (VOLTAREN) 1 % GEL Apply 2 g topically 4 (four) times daily. 100 g 0   escitalopram (LEXAPRO) 10 MG tablet Take 1 tablet (10 mg total) by mouth daily. (Patient taking differently: Take 10 mg by mouth daily. Take one tablet with '5mg'$  dose to equal '15mg'$ .)     escitalopram (LEXAPRO) 5 MG tablet Take 1 tablet (5 mg total) by mouth daily for 7 days. TAKE 1 TABLET OF 5 MG ONCE DAILY, IN ADDITION TO YOUR CURRENT PRESCRIPTION OF 10 MG ONCE DAILY. FOR A TOTAL DAILY DOSE OF LEXAPRO 15 MG ONCE DAILY. (Patient taking differently: Take 5 mg by mouth daily. Take one tablet with '10mg'$  dose to equal '15mg'$ .) 7 tablet 0   gabapentin (NEURONTIN) 300 MG capsule Take 1 capsule (300 mg total) by mouth 3 (three) times daily for 7 days. 21 capsule 0   hydrOXYzine (ATARAX) 10 MG tablet Take 10 mg by mouth 3 (three) times daily as needed for anxiety.     Lidocaine-Menthol (ICY HOT MAX LIDOCAINE EX) Apply 1 Application topically daily as needed (pain).     melatonin 3 MG TABS tablet Take 3 mg by mouth at bedtime.     Misc Natural Products (OSTEO BI-FLEX TRIPLE STRENGTH PO) Take 1 capsule by mouth daily.  pantoprazole (PROTONIX) 40 MG tablet TAKE 1 TABLET BY MOUTH DAILY (Patient taking differently: 40 mg daily.) 30 tablet 0   prazosin (MINIPRESS) 1 MG capsule Take 1 capsule (1 mg total) by mouth at bedtime for 7 days. 7 capsule 0   propranolol (INDERAL) 10 MG tablet Take 1 tablet (10 mg total) by mouth 2 (two) times daily.     QUEtiapine (SEROQUEL) 50 MG tablet Take 1 tablet (50 mg total) by mouth at bedtime for 7 days. 7 tablet 0    Labs  Lab Results:  Admission on 09/02/2021  Component Date Value Ref Range Status   SARS Coronavirus 2 by RT PCR 09/02/2021 NEGATIVE  NEGATIVE Final    Comment: (NOTE) SARS-CoV-2 target nucleic acids are NOT DETECTED.  The SARS-CoV-2 RNA is generally detectable in upper respiratory specimens during the acute phase of infection. The lowest concentration of SARS-CoV-2 viral copies this assay can detect is 138 copies/mL. A negative result does not preclude SARS-Cov-2 infection and should not be used as the sole basis for treatment or other patient management decisions. A negative result may occur with  improper specimen collection/handling, submission of specimen other than nasopharyngeal swab, presence of viral mutation(s) within the areas targeted by this assay, and inadequate number of viral copies(<138 copies/mL). A negative result must be combined with clinical observations, patient history, and epidemiological information. The expected result is Negative.  Fact Sheet for Patients:  EntrepreneurPulse.com.au  Fact Sheet for Healthcare Providers:  IncredibleEmployment.be  This test is no                          t yet approved or cleared by the [place] FDA and  has been authorized for detection and/or diagnosis of SARS-CoV-2 by FDA under an Emergency Use Authorization (EUA). This EUA will remain  in effect (meaning this test can be used) for the duration of the COVID-19 declaration under Section 564(b)(1) of the Act, 21 U.S.C.section 360bbb-3(b)(1), unless the authorization is terminated  or revoked sooner.       Influenza A by PCR 09/02/2021 NEGATIVE  NEGATIVE Final   Influenza B by PCR 09/02/2021 NEGATIVE  NEGATIVE Final   Comment: (NOTE) The Xpert Xpress SARS-CoV-2/FLU/RSV plus assay is intended as an aid in the diagnosis of influenza from Nasopharyngeal swab specimens and should not be used as a sole basis for treatment. Nasal washings and aspirates are unacceptable for Xpert Xpress SARS-CoV-2/FLU/RSV testing.  Fact Sheet for  Patients: EntrepreneurPulse.com.au  Fact Sheet for Healthcare Providers: IncredibleEmployment.be  This test is not yet approved or cleared by the [place] FDA and has been authorized for detection and/or diagnosis of SARS-CoV-2 by FDA under an Emergency Use Authorization (EUA). This EUA will remain in effect (meaning this test can be used) for the duration of the COVID-19 declaration under Section 564(b)(1) of the Act, 21 U.S.C. section 360bbb-3(b)(1), unless the authorization is terminated or revoked.  Performed at [facility], [place] [address], [place], [place] [address]    WBC 09/02/2021 8.5  4.0 - 10.5 K/uL Final   RBC 09/02/2021 4.76  3.87 - 5.11 MIL/uL Final   Hemoglobin 09/02/2021 12.4  12.0 - 15.0 g/dL Final   HCT 09/02/2021 38.4  36.0 - 46.0 % Final   MCV 09/02/2021 80.7  80.0 - 100.0 fL Final   MCH 09/02/2021 26.1  26.0 - 34.0 pg Final   MCHC 09/02/2021 32.3  30.0 - 36.0 g/dL Final   RDW  09/02/2021 13.2  11.5 - 15.5 % Final   Platelets 09/02/2021 295  150 - 400 K/uL Final   nRBC 09/02/2021 0.0  0.0 - 0.2 % Final   Neutrophils Relative % 09/02/2021 56  % Final   Neutro Abs 09/02/2021 4.8  1.7 - 7.7 K/uL Final   Lymphocytes Relative 09/02/2021 36  % Final   Lymphs Abs 09/02/2021 3.1  0.7 - 4.0 K/uL Final   Monocytes Relative 09/02/2021 6  % Final   Monocytes Absolute 09/02/2021 0.5  0.1 - 1.0 K/uL Final   Eosinophils Relative 09/02/2021 1  % Final   Eosinophils Absolute 09/02/2021 0.1  0.0 - 0.5 K/uL Final   Basophils Relative 09/02/2021 1  % Final   Basophils Absolute 09/02/2021 0.0  0.0 - 0.1 K/uL Final   Immature Granulocytes 09/02/2021 0  % Final   Abs Immature Granulocytes 09/02/2021 0.02  0.00 - 0.07 K/uL Final   Performed at [facility], [place] [address] [place], [place] [address]   Sodium 09/02/2021 137  135 - 145 mmol/L Final   Potassium 09/02/2021 3.6  3.5 - 5.1 mmol/L Final   Chloride 09/02/2021 102  98 -  111 mmol/L Final   CO2 09/02/2021 28  22 - 32 mmol/L Final   Glucose, Bld 09/02/2021 92  70 - 99 mg/dL Final   Glucose reference range applies only to samples taken after fasting for at least 8 hours.   BUN 09/02/2021 6  6 - 20 mg/dL Final   Creatinine, Ser 09/02/2021 0.72  0.44 - 1.00 mg/dL Final   Calcium 09/02/2021 10.1  8.9 - 10.3 mg/dL Final   Total Protein 09/02/2021 7.5  6.5 - 8.1 g/dL Final   Albumin 09/02/2021 4.2  3.5 - 5.0 g/dL Final   AST 09/02/2021 17  15 - 41 U/L Final   ALT 09/02/2021 16  0 - 44 U/L Final   Alkaline Phosphatase 09/02/2021 70  38 - 126 U/L Final   Total Bilirubin 09/02/2021 0.4  0.3 - 1.2 mg/dL Final   GFR, Estimated 09/02/2021 >60  >60 mL/min Final   Comment: (NOTE) Calculated using the CKD-EPI Creatinine Equation (2021)    Anion gap 09/02/2021 7  5 - 15 Final   Performed at [place] [address] [place], [place] [address]   Hgb A1c MFr Bld 09/02/2021 5.0  4.8 - 5.6 % Final   Comment: (NOTE) Pre diabetes:          5.7%-6.4%  Diabetes:              >6.4%  Glycemic control for   <7.0% adults with diabetes    Mean Plasma Glucose 09/02/2021 96.8  mg/dL Final   Performed at [facility] [address], [place], [place] [address]   TSH 09/02/2021 2.382  0.350 - 4.500 uIU/mL Final   Comment: Performed by a 3rd Generation assay with a functional sensitivity of <=0.01 uIU/mL. Performed at [facility], [place] [address] [place], [place] [address]    POC Amphetamine UR 09/02/2021 None Detected  NONE DETECTED (Cut Off Level 1000 ng/mL) Preliminary   POC Secobarbital (BAR) 09/02/2021 None Detected  NONE DETECTED (Cut Off Level 300 ng/mL) Preliminary   POC Buprenorphine (BUP) 09/02/2021 None Detected  NONE DETECTED (Cut Off Level 10 ng/mL) Preliminary   POC Oxazepam (BZO) 09/02/2021 None Detected  NONE DETECTED (Cut Off Level 300 ng/mL) Preliminary   POC Cocaine UR 09/02/2021 None Detected  NONE DETECTED (  Cut Off Level 300 ng/mL)  Preliminary   POC Methamphetamine UR 09/02/2021 None Detected  NONE DETECTED (Cut Off Level 1000 ng/mL) Preliminary   POC Morphine 09/02/2021 None Detected  NONE DETECTED (Cut Off Level 300 ng/mL) Preliminary   POC Methadone UR 09/02/2021 None Detected  NONE DETECTED (Cut Off Level 300 ng/mL) Preliminary   POC Oxycodone UR 09/02/2021 None Detected  NONE DETECTED (Cut Off Level 100 ng/mL) Preliminary   POC Marijuana UR 09/02/2021 None Detected  NONE DETECTED (Cut Off Level 50 ng/mL) Preliminary   SARSCOV2ONAVIRUS 2 AG 09/02/2021 NEGATIVE  NEGATIVE Final   Comment: (NOTE) SARS-CoV-2 antigen NOT DETECTED.   Negative results are presumptive.  Negative results do not preclude SARS-CoV-2 infection and should not be used as the sole basis for treatment or other patient management decisions, including infection  control decisions, particularly in the presence of clinical signs and  symptoms consistent with COVID-19, or in those who have been in contact with the virus.  Negative results must be combined with clinical observations, patient history, and epidemiological information. The expected result is Negative.  Fact Sheet for Patients: HandmadeRecipes.com.cy  Fact Sheet for Healthcare Providers: FuneralLife.at  This test is not yet approved or cleared by the [place] FDA and  has been authorized for detection and/or diagnosis of SARS-CoV-2 by FDA under an Emergency Use Authorization (EUA).  This EUA will remain in effect (meaning this test can be used) for the duration of  the COV                          ID-19 declaration under Section 564(b)(1) of the Act, 21 U.S.C. section 360bbb-3(b)(1), unless the authorization is terminated or revoked sooner.     Preg Test, Ur 09/02/2021 NEGATIVE  NEGATIVE Final   Comment:        THE SENSITIVITY OF THIS METHODOLOGY IS >24 mIU/mL   Admission on 08/02/2021, Discharged on 08/03/2021  Component  Date Value Ref Range Status   SARS Coronavirus 2 by RT PCR 08/03/2021 NEGATIVE  NEGATIVE Final   Comment: (NOTE) SARS-CoV-2 target nucleic acids are NOT DETECTED.  The SARS-CoV-2 RNA is generally detectable in upper respiratory specimens during the acute phase of infection. The lowest concentration of SARS-CoV-2 viral copies this assay can detect is 138 copies/mL. A negative result does not preclude SARS-Cov-2 infection and should not be used as the sole basis for treatment or other patient management decisions. A negative result may occur with  improper specimen collection/handling, submission of specimen other than nasopharyngeal swab, presence of viral mutation(s) within the areas targeted by this assay, and inadequate number of viral copies(<138 copies/mL). A negative result must be combined with clinical observations, patient history, and epidemiological information. The expected result is Negative.  Fact Sheet for Patients:  EntrepreneurPulse.com.au  Fact Sheet for Healthcare Providers:  IncredibleEmployment.be  This test is no                          t yet approved or cleared by the [place] FDA and  has been authorized for detection and/or diagnosis of SARS-CoV-2 by FDA under an Emergency Use Authorization (EUA). This EUA will remain  in effect (meaning this test can be used) for the duration of the COVID-19 declaration under Section 564(b)(1) of the Act, 21 U.S.C.section 360bbb-3(b)(1), unless the authorization is terminated  or revoked sooner.       Influenza A by PCR 08/03/2021  NEGATIVE  NEGATIVE Final   Influenza B by PCR 08/03/2021 NEGATIVE  NEGATIVE Final   Comment: (NOTE) The Xpert Xpress SARS-CoV-2/FLU/RSV plus assay is intended as an aid in the diagnosis of influenza from Nasopharyngeal swab specimens and should not be used as a sole basis for treatment. Nasal washings and aspirates are unacceptable for Xpert Xpress  SARS-CoV-2/FLU/RSV testing.  Fact Sheet for Patients: EntrepreneurPulse.com.au  Fact Sheet for Healthcare Providers: IncredibleEmployment.be  This test is not yet approved or cleared by the [place] FDA and has been authorized for detection and/or diagnosis of SARS-CoV-2 by FDA under an Emergency Use Authorization (EUA). This EUA will remain in effect (meaning this test can be used) for the duration of the COVID-19 declaration under Section 564(b)(1) of the Act, 21 U.S.C. section 360bbb-3(b)(1), unless the authorization is terminated or revoked.  Performed at [facility], [place] [address] [place], [place] [address]    WBC 08/02/2021 9.1  4.0 - 10.5 K/uL Final   RBC 08/02/2021 4.82  3.87 - 5.11 MIL/uL Final   Hemoglobin 08/02/2021 12.8  12.0 - 15.0 g/dL Final   HCT 08/02/2021 38.8  36.0 - 46.0 % Final   MCV 08/02/2021 80.5  80.0 - 100.0 fL Final   MCH 08/02/2021 26.6  26.0 - 34.0 pg Final   MCHC 08/02/2021 33.0  30.0 - 36.0 g/dL Final   RDW 08/02/2021 13.9  11.5 - 15.5 % Final   Platelets 08/02/2021 345  150 - 400 K/uL Final   nRBC 08/02/2021 0.0  0.0 - 0.2 % Final   Neutrophils Relative % 08/02/2021 64  % Final   Neutro Abs 08/02/2021 5.7  1.7 - 7.7 K/uL Final   Lymphocytes Relative 08/02/2021 31  % Final   Lymphs Abs 08/02/2021 2.8  0.7 - 4.0 K/uL Final   Monocytes Relative 08/02/2021 5  % Final   Monocytes Absolute 08/02/2021 0.5  0.1 - 1.0 K/uL Final   Eosinophils Relative 08/02/2021 0  % Final   Eosinophils Absolute 08/02/2021 0.0  0.0 - 0.5 K/uL Final   Basophils Relative 08/02/2021 0  % Final   Basophils Absolute 08/02/2021 0.0  0.0 - 0.1 K/uL Final   Immature Granulocytes 08/02/2021 0  % Final   Abs Immature Granulocytes 08/02/2021 0.03  0.00 - 0.07 K/uL Final   Performed at [facility], [place] [address] [place], [place] [address]   Sodium 08/02/2021 138  135 - 145 mmol/L Final   Potassium 08/02/2021 3.8  3.5 - 5.1  mmol/L Final   Chloride 08/02/2021 104  98 - 111 mmol/L Final   CO2 08/02/2021 23  22 - 32 mmol/L Final   Glucose, Bld 08/02/2021 90  70 - 99 mg/dL Final   Glucose reference range applies only to samples taken after fasting for at least 8 hours.   BUN 08/02/2021 15  6 - 20 mg/dL Final   Creatinine, Ser 08/02/2021 0.75  0.44 - 1.00 mg/dL Final   Calcium 08/02/2021 10.0  8.9 - 10.3 mg/dL Final   Total Protein 08/02/2021 7.1  6.5 - 8.1 g/dL Final   Albumin 08/02/2021 4.3  3.5 - 5.0 g/dL Final   AST 08/02/2021 21  15 - 41 U/L Final   ALT 08/02/2021 20  0 - 44 U/L Final   Alkaline Phosphatase 08/02/2021 74  38 - 126 U/L Final   Total Bilirubin 08/02/2021 0.5  0.3 - 1.2 mg/dL Final   GFR, Estimated 08/02/2021 >60  >60 mL/min Final  Comment: (NOTE) Calculated using the CKD-EPI Creatinine Equation (2021)    Anion gap 08/02/2021 11  5 - 15 Final   Performed at [facility], [place] [address] [place], [place] [address]   Alcohol, Ethyl (B) 08/02/2021 <10  <10 mg/dL Final   Comment: (NOTE) Lowest detectable limit for serum alcohol is 10 mg/dL.  For medical purposes only. Performed at [facility], [place] [address], [place], [place] [address]    POC Amphetamine UR 08/03/2021 Positive (A)  NONE DETECTED (Cut Off Level 1000 ng/mL) Final   POC Secobarbital (BAR) 08/03/2021 None Detected  NONE DETECTED (Cut Off Level 300 ng/mL) Final   POC Buprenorphine (BUP) 08/03/2021 None Detected  NONE DETECTED (Cut Off Level 10 ng/mL) Final   POC Oxazepam (BZO) 08/03/2021 Positive (A)  NONE DETECTED (Cut Off Level 300 ng/mL) Final   POC Cocaine UR 08/03/2021 None Detected  NONE DETECTED (Cut Off Level 300 ng/mL) Final   POC Methamphetamine UR 08/03/2021 None Detected  NONE DETECTED (Cut Off Level 1000 ng/mL) Final   POC Morphine 08/03/2021 None Detected  NONE DETECTED (Cut Off Level 300 ng/mL) Final   POC Methadone UR 08/03/2021 None Detected  NONE DETECTED (Cut Off Level 300 ng/mL) Final   POC  Oxycodone UR 08/03/2021 None Detected  NONE DETECTED (Cut Off Level 100 ng/mL) Final   POC Marijuana UR 08/03/2021 None Detected  NONE DETECTED (Cut Off Level 50 ng/mL) Final   SARS Coronavirus 2 Ag 08/03/2021 Negative  Negative Preliminary   SARSCOV2ONAVIRUS 2 AG 08/03/2021 NEGATIVE  NEGATIVE Final   Comment: (NOTE) SARS-CoV-2 antigen NOT DETECTED.   Negative results are presumptive.  Negative results do not preclude SARS-CoV-2 infection and should not be used as the sole basis for treatment or other patient management decisions, including infection  control decisions, particularly in the presence of clinical signs and  symptoms consistent with COVID-19, or in those who have been in contact with the virus.  Negative results must be combined with clinical observations, patient history, and epidemiological information. The expected result is Negative.  Fact Sheet for Patients: HandmadeRecipes.com.cy  Fact Sheet for Healthcare Providers: FuneralLife.at  This test is not yet approved or cleared by the [place] FDA and  has been authorized for detection and/or diagnosis of SARS-CoV-2 by FDA under an Emergency Use Authorization (EUA).  This EUA will remain in effect (meaning this test can be used) for the duration of  the COV                          ID-19 declaration under Section 564(b)(1) of the Act, 21 U.S.C. section 360bbb-3(b)(1), unless the authorization is terminated or revoked sooner.     Preg Test, Ur 08/03/2021 NEGATIVE  NEGATIVE Final   Comment:        THE SENSITIVITY OF THIS METHODOLOGY IS >24 mIU/mL   Admission on 08/01/2021, Discharged on 08/01/2021  Component Date Value Ref Range Status   Sodium 08/01/2021 137  135 - 145 mmol/L Final   Potassium 08/01/2021 3.7  3.5 - 5.1 mmol/L Final   Chloride 08/01/2021 107  98 - 111 mmol/L Final   CO2 08/01/2021 20 (L)  22 - 32 mmol/L Final   Glucose, Bld 08/01/2021 92  70 - 99  mg/dL Final   Glucose reference range applies only to samples taken after fasting for at least 8 hours.   BUN 08/01/2021 13  6 - 20 mg/dL Final  Creatinine, Ser 08/01/2021 0.63  0.44 - 1.00 mg/dL Final   Calcium 08/01/2021 9.3  8.9 - 10.3 mg/dL Final   GFR, Estimated 08/01/2021 >60  >60 mL/min Final   Comment: (NOTE) Calculated using the CKD-EPI Creatinine Equation (2021)    Anion gap 08/01/2021 10  5 - 15 Final   Performed at [facility] [address], [place], [place] [address]   WBC 08/01/2021 8.0  4.0 - 10.5 K/uL Final   RBC 08/01/2021 4.84  3.87 - 5.11 MIL/uL Final   Hemoglobin 08/01/2021 12.4  12.0 - 15.0 g/dL Final   HCT 08/01/2021 40.1  36.0 - 46.0 % Final   MCV 08/01/2021 82.9  80.0 - 100.0 fL Final   MCH 08/01/2021 25.6 (L)  26.0 - 34.0 pg Final   MCHC 08/01/2021 30.9  30.0 - 36.0 g/dL Final   RDW 08/01/2021 14.0  11.5 - 15.5 % Final   Platelets 08/01/2021 305  150 - 400 K/uL Final   nRBC 08/01/2021 0.0  0.0 - 0.2 % Final   Performed at [facility] [address] [place], [place] [address]   I-stat hCG, quantitative 08/01/2021 <5.0  <5 mIU/mL Final   Comment 3 08/01/2021          Final   Comment:   GEST. AGE      CONC.  (mIU/mL)   <=1 WEEK        5 - 50     2 WEEKS       50 - 500     3 WEEKS       100 - 10,000     4 WEEKS     1,000 - 30,000        FEMALE AND NON-PREGNANT FEMALE:     LESS THAN 5 mIU/mL    Troponin I (High Sensitivity) 08/01/2021 2  <18 ng/L Final   Comment: (NOTE) Elevated high sensitivity troponin I (hsTnI) values and significant  changes across serial measurements may suggest ACS but many other  chronic and acute conditions are known to elevate hsTnI results.  Refer to the "Links" section for chest pain algorithms and additional  guidance. Performed at [facility], [place] [address], [place], [place] [address]    Color, Urine 08/01/2021 YELLOW  YELLOW Final   APPearance 08/01/2021 HAZY (A)  CLEAR Final   Specific Gravity,  Urine 08/01/2021 1.018  1.005 - 1.030 Final   pH 08/01/2021 5.0  5.0 - 8.0 Final   Glucose, UA 08/01/2021 NEGATIVE  NEGATIVE mg/dL Final   Hgb urine dipstick 08/01/2021 NEGATIVE  NEGATIVE Final   Bilirubin Urine 08/01/2021 NEGATIVE  NEGATIVE Final   Ketones, ur 08/01/2021 NEGATIVE  NEGATIVE mg/dL Final   Protein, ur 08/01/2021 NEGATIVE  NEGATIVE mg/dL Final   Nitrite 08/01/2021 NEGATIVE  NEGATIVE Final   Leukocytes,Ua 08/01/2021 NEGATIVE  NEGATIVE Final   Performed at [facility], [place] [address] [name], [place] [address]  Admission on 07/30/2021, Discharged on 07/31/2021  Component Date Value Ref Range Status   Preg Test, Ur 07/30/2021 NEGATIVE  NEGATIVE Final   Comment:        THE SENSITIVITY OF THIS METHODOLOGY IS >24 mIU/mL   Admission on 07/29/2021, Discharged on 07/30/2021  Component Date Value Ref Range Status   Glucose-Capillary 07/29/2021 90  70 - 99 mg/dL Final   Glucose reference range applies only to samples taken after fasting for at least 8 hours.   Sodium 07/29/2021 140  135 - 145 mmol/L Final   Potassium 07/29/2021 3.9  3.5 - 5.1 mmol/L Final   Chloride 07/29/2021 112 (H)  98 - 111 mmol/L Final   CO2 07/29/2021 18 (L)  22 - 32 mmol/L Final   Glucose, Bld 07/29/2021 94  70 - 99 mg/dL Final   Glucose reference range applies only to samples taken after fasting for at least 8 hours.   BUN 07/29/2021 10  6 - 20 mg/dL Final   Creatinine, Ser 07/29/2021 0.68  0.44 - 1.00 mg/dL Final   Calcium 07/29/2021 8.4 (L)  8.9 - 10.3 mg/dL Final   Total Protein 07/29/2021 5.8 (L)  6.5 - 8.1 g/dL Final   Albumin 07/29/2021 3.3 (L)  3.5 - 5.0 g/dL Final   AST 07/29/2021 17  15 - 41 U/L Final   ALT 07/29/2021 18  0 - 44 U/L Final   Alkaline Phosphatase 07/29/2021 57  38 - 126 U/L Final   Total Bilirubin 07/29/2021 0.4  0.3 - 1.2 mg/dL Final   GFR, Estimated 07/29/2021 >60  >60 mL/min Final   Comment: (NOTE) Calculated using the CKD-EPI Creatinine Equation (2021)    Anion  gap 07/29/2021 10  5 - 15 Final   Performed at [facility], [place] [address], [place], [place] [address]   Salicylate Lvl 60/45/4098 <7.0 (L)  7.0 - 30.0 mg/dL Final   Performed at [facility] [address], [place], [place] [address]   Acetaminophen (Tylenol), Serum 07/29/2021 <10 (L)  10 - 30 ug/mL Final   Comment: (NOTE) Therapeutic concentrations vary significantly. A range of 10-30 ug/mL  may be an effective concentration for many patients. However, some  are best treated at concentrations outside of this range. Acetaminophen concentrations >150 ug/mL at 4 hours after ingestion  and >50 ug/mL at 12 hours after ingestion are often associated with  toxic reactions.  Performed at [facility], [place] [address] [place], [place] [address]    Alcohol, Ethyl (B) 07/29/2021 <10  <10 mg/dL Final   Comment: (NOTE) Lowest detectable limit for serum alcohol is 10 mg/dL.  For medical purposes only. Performed at [facility], [place] [address] [place], [place] [address]    Opiates 07/29/2021 NONE DETECTED  NONE DETECTED Final   Cocaine 07/29/2021 NONE DETECTED  NONE DETECTED Final   Benzodiazepines 07/29/2021 NONE DETECTED  NONE DETECTED Final   Amphetamines 07/29/2021 NONE DETECTED  NONE DETECTED Final   Tetrahydrocannabinol 07/29/2021 NONE DETECTED  NONE DETECTED Final   Barbiturates 07/29/2021 NONE DETECTED  NONE DETECTED Final   Comment: (NOTE) DRUG SCREEN FOR MEDICAL PURPOSES ONLY.  IF CONFIRMATION IS NEEDED FOR ANY PURPOSE, NOTIFY LAB WITHIN 5 DAYS.  LOWEST DETECTABLE LIMITS FOR URINE DRUG SCREEN Drug Class                     Cutoff (ng/mL) Amphetamine and metabolites    1000 Barbiturate and metabolites    200 Benzodiazepine                 865 Tricyclics and metabolites     300 Opiates and metabolites        300 Cocaine and metabolites        300 THC                            50 Performed at [facility], [place],  [place] [address]    WBC 07/29/2021 6.7  4.0 - 10.5 K/uL Final   RBC 07/29/2021 4.13  3.87 - 5.11 MIL/uL Final   Hemoglobin 07/29/2021 10.8 (L)  12.0 - 15.0 g/dL Final   HCT 07/29/2021 34.7 (L)  36.0 - 46.0 % Final   MCV 07/29/2021 84.0  80.0 - 100.0 fL Final   MCH 07/29/2021 26.2  26.0 - 34.0 pg Final   MCHC 07/29/2021 31.1  30.0 - 36.0 g/dL Final   RDW 07/29/2021 14.3  11.5 - 15.5 % Final   Platelets 07/29/2021 236  150 - 400 K/uL Final   nRBC 07/29/2021 0.0  0.0 - 0.2 % Final   Neutrophils Relative % 07/29/2021 61  % Final   Neutro Abs 07/29/2021 4.1  1.7 - 7.7 K/uL Final   Lymphocytes Relative 07/29/2021 33  % Final   Lymphs Abs 07/29/2021 2.2  0.7 - 4.0 K/uL Final   Monocytes Relative 07/29/2021 5  % Final   Monocytes Absolute 07/29/2021 0.3  0.1 - 1.0 K/uL Final   Eosinophils Relative 07/29/2021 1  % Final   Eosinophils Absolute 07/29/2021 0.1  0.0 - 0.5 K/uL Final   Basophils Relative 07/29/2021 0  % Final   Basophils Absolute 07/29/2021 0.0  0.0 - 0.1 K/uL Final   Immature Granulocytes 07/29/2021 0  % Final   Abs Immature Granulocytes 07/29/2021 0.02  0.00 - 0.07 K/uL Final   Performed at [facility], [place] [address] [place], [place] [address]   Color, Urine 07/29/2021 YELLOW  YELLOW Final   APPearance 07/29/2021 CLOUDY (A)  CLEAR Final   Specific Gravity, Urine 07/29/2021 1.005  1.005 - 1.030 Final   pH 07/29/2021 6.0  5.0 - 8.0 Final   Glucose, UA 07/29/2021 NEGATIVE  NEGATIVE mg/dL Final   Hgb urine dipstick 07/29/2021 NEGATIVE  NEGATIVE Final   Bilirubin Urine 07/29/2021 NEGATIVE  NEGATIVE Final   Ketones, ur 07/29/2021 NEGATIVE  NEGATIVE mg/dL Final   Protein, ur 07/29/2021 NEGATIVE  NEGATIVE mg/dL Final   Nitrite 07/29/2021 NEGATIVE  NEGATIVE Final   Leukocytes,Ua 07/29/2021 MODERATE (A)  NEGATIVE Final   RBC / HPF 07/29/2021 6-10  0 - 5 RBC/hpf Final   WBC, UA 07/29/2021 6-10  0 - 5 WBC/hpf Final   Bacteria, UA 07/29/2021 MANY (A)  NONE SEEN Final   Squamous  Epithelial / LPF 07/29/2021 6-10  0 - 5 Final   Mucus 07/29/2021 PRESENT   Final   Amorphous Crystal 07/29/2021 PRESENT   Final   Performed at [facility], [place] [address], [place], [place] [address]   SARS Coronavirus 2 by RT PCR 07/30/2021 NEGATIVE  NEGATIVE Final   Comment: (NOTE) SARS-CoV-2 target nucleic acids are NOT DETECTED.  The SARS-CoV-2 RNA is generally detectable in upper respiratory specimens during the acute phase of infection. The lowest concentration of SARS-CoV-2 viral copies this assay can detect is 138 copies/mL. A negative result does not preclude SARS-Cov-2 infection and should not be used as the sole basis for treatment or other patient management decisions. A negative result may occur with  improper specimen collection/handling, submission of specimen other than nasopharyngeal swab, presence of viral mutation(s) within the areas targeted by this assay, and inadequate number of viral copies(<138 copies/mL). A negative result must be combined with clinical observations, patient history, and epidemiological information. The expected result is Negative.  Fact Sheet for Patients:  EntrepreneurPulse.com.au  Fact Sheet for Healthcare Providers:  IncredibleEmployment.be  This test is no  t yet approved or cleared by the [place] and  has been authorized for detection and/or diagnosis of SARS-CoV-2 by FDA under an Emergency Use Authorization (EUA). This EUA will remain  in effect (meaning this test can be used) for the duration of the COVID-19 declaration under Section 564(b)(1) of the Act, 21 U.S.C.section 360bbb-3(b)(1), unless the authorization is terminated  or revoked sooner.       Influenza A by PCR 07/30/2021 NEGATIVE  NEGATIVE Final   Influenza B by PCR 07/30/2021 NEGATIVE  NEGATIVE Final   Comment: (NOTE) The Xpert Xpress SARS-CoV-2/FLU/RSV plus assay is intended as an aid in  the diagnosis of influenza from Nasopharyngeal swab specimens and should not be used as a sole basis for treatment. Nasal washings and aspirates are unacceptable for Xpert Xpress SARS-CoV-2/FLU/RSV testing.  Fact Sheet for Patients: EntrepreneurPulse.com.au  Fact Sheet for Healthcare Providers: IncredibleEmployment.be  This test is not yet approved or cleared by the [place] FDA and has been authorized for detection and/or diagnosis of SARS-CoV-2 by FDA under an Emergency Use Authorization (EUA). This EUA will remain in effect (meaning this test can be used) for the duration of the COVID-19 declaration under Section 564(b)(1) of the Act, 21 U.S.C. section 360bbb-3(b)(1), unless the authorization is terminated or revoked.  Performed at [facility], [place] [address] [place], [place] [address]   Admission on 07/20/2021, Discharged on 07/25/2021  Component Date Value Ref Range Status   Glucose-Capillary 07/20/2021 91  70 - 99 mg/dL Final   Glucose reference range applies only to samples taken after fasting for at least 8 hours.   Sodium 07/20/2021 140  135 - 145 mmol/L Final   Potassium 07/20/2021 3.9  3.5 - 5.1 mmol/L Final   Chloride 07/20/2021 111  98 - 111 mmol/L Final   CO2 07/20/2021 21 (L)  22 - 32 mmol/L Final   Glucose, Bld 07/20/2021 94  70 - 99 mg/dL Final   Glucose reference range applies only to samples taken after fasting for at least 8 hours.   BUN 07/20/2021 16  6 - 20 mg/dL Final   Creatinine, Ser 07/20/2021 0.61  0.44 - 1.00 mg/dL Final   Calcium 07/20/2021 8.9  8.9 - 10.3 mg/dL Final   Total Protein 07/20/2021 6.3 (L)  6.5 - 8.1 g/dL Final   Albumin 07/20/2021 3.6  3.5 - 5.0 g/dL Final   AST 07/20/2021 17  15 - 41 U/L Final   ALT 07/20/2021 15  0 - 44 U/L Final   Alkaline Phosphatase 07/20/2021 53  38 - 126 U/L Final   Total Bilirubin 07/20/2021 0.5  0.3 - 1.2 mg/dL Final   GFR, Estimated 07/20/2021 >60  >60 mL/min  Final   Comment: (NOTE) Calculated using the CKD-EPI Creatinine Equation (2021)    Anion gap 07/20/2021 8  5 - 15 Final   Performed at [facility], [place] [address] [place], [place] [address]   Salicylate Lvl 03/00/9233 <7.0 (L)  7.0 - 30.0 mg/dL Final   Performed at [facility] [address] [place], [place] [address]   Acetaminophen (Tylenol), Serum 07/20/2021 <10 (L)  10 - 30 ug/mL Final   Comment: (NOTE) Therapeutic concentrations vary significantly. A range of 10-30 ug/mL  may be an effective concentration for many patients. However, some  are best treated at concentrations outside of this range. Acetaminophen concentrations >150 ug/mL at 4 hours after ingestion  and >50 ug/mL at 12 hours after ingestion are  often associated with  toxic reactions.  Performed at [facility], [place] [address] [place], [place] [address]    Alcohol, Ethyl (B) 07/20/2021 <10  <10 mg/dL Final   Comment: (NOTE) Lowest detectable limit for serum alcohol is 10 mg/dL.  For medical purposes only. Performed at [facility], [place] [address], [place], [place] [address]    Opiates 07/21/2021 NONE DETECTED  NONE DETECTED Final   Cocaine 07/21/2021 NONE DETECTED  NONE DETECTED Final   Benzodiazepines 07/21/2021 NONE DETECTED  NONE DETECTED Final   Amphetamines 07/21/2021 NONE DETECTED  NONE DETECTED Final   Tetrahydrocannabinol 07/21/2021 NONE DETECTED  NONE DETECTED Final   Barbiturates 07/21/2021 NONE DETECTED  NONE DETECTED Final   Comment: (NOTE) DRUG SCREEN FOR MEDICAL PURPOSES ONLY.  IF CONFIRMATION IS NEEDED FOR ANY PURPOSE, NOTIFY LAB WITHIN 5 DAYS.  LOWEST DETECTABLE LIMITS FOR URINE DRUG SCREEN Drug Class                     Cutoff (ng/mL) Amphetamine and metabolites    1000 Barbiturate and metabolites    200 Benzodiazepine                 253 Tricyclics and metabolites     300 Opiates and metabolites        300 Cocaine and metabolites        300 THC                             50 Performed at [facility], [place] [address], [place], [place] [address]    WBC 07/20/2021 8.3  4.0 - 10.5 K/uL Final   RBC 07/20/2021 4.09  3.87 - 5.11 MIL/uL Final   Hemoglobin 07/20/2021 10.6 (L)  12.0 - 15.0 g/dL Final   HCT 07/20/2021 33.9 (L)  36.0 - 46.0 % Final   MCV 07/20/2021 82.9  80.0 - 100.0 fL Final   MCH 07/20/2021 25.9 (L)  26.0 - 34.0 pg Final   MCHC 07/20/2021 31.3  30.0 - 36.0 g/dL Final   RDW 07/20/2021 14.4  11.5 - 15.5 % Final   Platelets 07/20/2021 272  150 - 400 K/uL Final   nRBC 07/20/2021 0.0  0.0 - 0.2 % Final   Neutrophils Relative % 07/20/2021 61  % Final   Neutro Abs 07/20/2021 5.0  1.7 - 7.7 K/uL Final   Lymphocytes Relative 07/20/2021 32  % Final   Lymphs Abs 07/20/2021 2.7  0.7 - 4.0 K/uL Final   Monocytes Relative 07/20/2021 6  % Final   Monocytes Absolute 07/20/2021 0.5  0.1 - 1.0 K/uL Final   Eosinophils Relative 07/20/2021 1  % Final   Eosinophils Absolute 07/20/2021 0.1  0.0 - 0.5 K/uL Final   Basophils Relative 07/20/2021 0  % Final   Basophils Absolute 07/20/2021 0.0  0.0 - 0.1 K/uL Final   Immature Granulocytes 07/20/2021 0  % Final   Abs Immature Granulocytes 07/20/2021 0.03  0.00 - 0.07 K/uL Final   Performed at [facility], [place] [address] [place], [place] [address]   I-stat hCG, quantitative 07/20/2021 <5.0  <5 mIU/mL Final   Comment 3 07/20/2021          Final   Comment:   GEST. AGE      CONC.  (mIU/mL)   <=1 WEEK        5 - 50  2 WEEKS       50 - 500     3 WEEKS       100 - 10,000     4 WEEKS     1,000 - 30,000        FEMALE AND NON-PREGNANT FEMALE:     LESS THAN 5 mIU/mL    Sodium 07/21/2021 140  135 - 145 mmol/L Final   Potassium 07/21/2021 3.8  3.5 - 5.1 mmol/L Final   Chloride 07/21/2021 112 (H)  98 - 111 mmol/L Final   CO2 07/21/2021 22  22 - 32 mmol/L Final   Glucose, Bld 07/21/2021 106 (H)  70 - 99 mg/dL Final   Glucose reference range applies only to samples taken after fasting for at  least 8 hours.   BUN 07/21/2021 13  6 - 20 mg/dL Final   Creatinine, Ser 07/21/2021 0.61  0.44 - 1.00 mg/dL Final   Calcium 07/21/2021 8.5 (L)  8.9 - 10.3 mg/dL Final   Total Protein 07/21/2021 5.6 (L)  6.5 - 8.1 g/dL Final   Albumin 07/21/2021 3.2 (L)  3.5 - 5.0 g/dL Final   AST 07/21/2021 16  15 - 41 U/L Final   ALT 07/21/2021 12  0 - 44 U/L Final   Alkaline Phosphatase 07/21/2021 50  38 - 126 U/L Final   Total Bilirubin 07/21/2021 0.7  0.3 - 1.2 mg/dL Final   GFR, Estimated 07/21/2021 >60  >60 mL/min Final   Comment: (NOTE) Calculated using the CKD-EPI Creatinine Equation (2021)    Anion gap 07/21/2021 6  5 - 15 Final   Performed at [facility], [place] [address], [place], [place] [address]   WBC 07/21/2021 6.9  4.0 - 10.5 K/uL Final   RBC 07/21/2021 3.77 (L)  3.87 - 5.11 MIL/uL Final   Hemoglobin 07/21/2021 9.8 (L)  12.0 - 15.0 g/dL Final   HCT 07/21/2021 31.7 (L)  36.0 - 46.0 % Final   MCV 07/21/2021 84.1  80.0 - 100.0 fL Final   MCH 07/21/2021 26.0  26.0 - 34.0 pg Final   MCHC 07/21/2021 30.9  30.0 - 36.0 g/dL Final   RDW 07/21/2021 14.6  11.5 - 15.5 % Final   Platelets 07/21/2021 242  150 - 400 K/uL Final   nRBC 07/21/2021 0.0  0.0 - 0.2 % Final   Performed at [facility] [address] [place], [place] [address]   SARS Coronavirus 2 07/24/2021 NEGATIVE  NEGATIVE Final   Comment: (NOTE) SARS-CoV-2 target nucleic acids are NOT DETECTED.  The SARS-CoV-2 RNA is generally detectable in upper and lower respiratory specimens during the acute phase of infection. Negative results do not preclude SARS-CoV-2 infection, do not rule out co-infections with other pathogens, and should not be used as the sole basis for treatment or other patient management decisions. Negative results must be combined with clinical observations, patient history, and epidemiological information. The expected result is Negative.  Fact Sheet for  Patients: SugarRoll.be  Fact Sheet for Healthcare Providers: https://www.woods-mathews.com/  This test is not yet approved or cleared by the [place] FDA and  has been authorized for detection and/or diagnosis of SARS-CoV-2 by FDA under an Emergency Use Authorization (EUA). This EUA will remain  in effect (meaning this test can be used) for the duration of the COVID-19 declaration under Se  ction 564(b)(1) of the Act, 21 U.S.C. section 360bbb-3(b)(1), unless the authorization is terminated or revoked sooner.  Performed at [facility], [place] [address], [place], [place] [address]   Admission on 07/19/2021, Discharged on 07/20/2021  Component Date Value Ref Range Status   SARS Coronavirus 2 by RT PCR 07/19/2021 NEGATIVE  NEGATIVE Final   Comment: (NOTE) SARS-CoV-2 target nucleic acids are NOT DETECTED.  The SARS-CoV-2 RNA is generally detectable in upper respiratory specimens during the acute phase of infection. The lowest concentration of SARS-CoV-2 viral copies this assay can detect is 138 copies/mL. A negative result does not preclude SARS-Cov-2 infection and should not be used as the sole basis for treatment or other patient management decisions. A negative result may occur with  improper specimen collection/handling, submission of specimen other than nasopharyngeal swab, presence of viral mutation(s) within the areas targeted by this assay, and inadequate number of viral copies(<138 copies/mL). A negative result must be combined with clinical observations, patient history, and epidemiological information. The expected result is Negative.  Fact Sheet for Patients:  EntrepreneurPulse.com.au  Fact Sheet for Healthcare Providers:  IncredibleEmployment.be  This test is no                          t yet approved or cleared by the [place] FDA and  has been  authorized for detection and/or diagnosis of SARS-CoV-2 by FDA under an Emergency Use Authorization (EUA). This EUA will remain  in effect (meaning this test can be used) for the duration of the COVID-19 declaration under Section 564(b)(1) of the Act, 21 U.S.C.section 360bbb-3(b)(1), unless the authorization is terminated  or revoked sooner.       Influenza A by PCR 07/19/2021 NEGATIVE  NEGATIVE Final   Influenza B by PCR 07/19/2021 NEGATIVE  NEGATIVE Final   Comment: (NOTE) The Xpert Xpress SARS-CoV-2/FLU/RSV plus assay is intended as an aid in the diagnosis of influenza from Nasopharyngeal swab specimens and should not be used as a sole basis for treatment. Nasal washings and aspirates are unacceptable for Xpert Xpress SARS-CoV-2/FLU/RSV testing.  Fact Sheet for Patients: EntrepreneurPulse.com.au  Fact Sheet for Healthcare Providers: IncredibleEmployment.be  This test is not yet approved or cleared by the [place] FDA and has been authorized for detection and/or diagnosis of SARS-CoV-2 by FDA under an Emergency Use Authorization (EUA). This EUA will remain in effect (meaning this test can be used) for the duration of the COVID-19 declaration under Section 564(b)(1) of the Act, 21 U.S.C. section 360bbb-3(b)(1), unless the authorization is terminated or revoked.  Performed at [facility], [place] [address] [place], [place] [address]    POC Amphetamine UR 07/20/2021 None Detected  NONE DETECTED (Cut Off Level 1000 ng/mL) Final   POC Secobarbital (BAR) 07/20/2021 None Detected  NONE DETECTED (Cut Off Level 300 ng/mL) Final   POC Buprenorphine (BUP) 07/20/2021 None Detected  NONE DETECTED (Cut Off Level 10 ng/mL) Final   POC Oxazepam (BZO) 07/20/2021 Positive (A)  NONE DETECTED (Cut Off Level 300 ng/mL) Final   POC Cocaine UR 07/20/2021 None Detected  NONE DETECTED (Cut Off Level 300 ng/mL) Final   POC Methamphetamine UR 07/20/2021  None Detected  NONE DETECTED (Cut Off Level 1000 ng/mL) Final   POC Morphine 07/20/2021 None Detected  NONE DETECTED (Cut Off Level 300 ng/mL) Final   POC Methadone UR 07/20/2021 None Detected  NONE DETECTED (Cut Off Level 300 ng/mL) Final   POC Oxycodone  UR 07/20/2021 None Detected  NONE DETECTED (Cut Off Level 100 ng/mL) Final   POC Marijuana UR 07/20/2021 None Detected  NONE DETECTED (Cut Off Level 50 ng/mL) Final   SARSCOV2ONAVIRUS 2 AG 07/20/2021 NEGATIVE  NEGATIVE Final   Comment: (NOTE) SARS-CoV-2 antigen NOT DETECTED.   Negative results are presumptive.  Negative results do not preclude SARS-CoV-2 infection and should not be used as the sole basis for treatment or other patient management decisions, including infection  control decisions, particularly in the presence of clinical signs and  symptoms consistent with COVID-19, or in those who have been in contact with the virus.  Negative results must be combined with clinical observations, patient history, and epidemiological information. The expected result is Negative.  Fact Sheet for Patients: HandmadeRecipes.com.cy  Fact Sheet for Healthcare Providers: FuneralLife.at  This test is not yet approved or cleared by the [place] FDA and  has been authorized for detection and/or diagnosis of SARS-CoV-2 by FDA under an Emergency Use Authorization (EUA).  This EUA will remain in effect (meaning this test can be used) for the duration of  the COV                          ID-19 declaration under Section 564(b)(1) of the Act, 21 U.S.C. section 360bbb-3(b)(1), unless the authorization is terminated or revoked sooner.     Preg Test, Ur 07/20/2021 NEGATIVE  NEGATIVE Final   Comment:        THE SENSITIVITY OF THIS METHODOLOGY IS >24 mIU/mL   Admission on 07/18/2021, Discharged on 07/19/2021  Component Date Value Ref Range Status   SARS Coronavirus 2 by RT PCR 07/18/2021 NEGATIVE   NEGATIVE Final   Comment: (NOTE) SARS-CoV-2 target nucleic acids are NOT DETECTED.  The SARS-CoV-2 RNA is generally detectable in upper respiratory specimens during the acute phase of infection. The lowest concentration of SARS-CoV-2 viral copies this assay can detect is 138 copies/mL. A negative result does not preclude SARS-Cov-2 infection and should not be used as the sole basis for treatment or other patient management decisions. A negative result may occur with  improper specimen collection/handling, submission of specimen other than nasopharyngeal swab, presence of viral mutation(s) within the areas targeted by this assay, and inadequate number of viral copies(<138 copies/mL). A negative result must be combined with clinical observations, patient history, and epidemiological information. The expected result is Negative.  Fact Sheet for Patients:  EntrepreneurPulse.com.au  Fact Sheet for Healthcare Providers:  IncredibleEmployment.be  This test is no                          t yet approved or cleared by the [place] FDA and  has been authorized for detection and/or diagnosis of SARS-CoV-2 by FDA under an Emergency Use Authorization (EUA). This EUA will remain  in effect (meaning this test can be used) for the duration of the COVID-19 declaration under Section 564(b)(1) of the Act, 21 U.S.C.section 360bbb-3(b)(1), unless the authorization is terminated  or revoked sooner.       Influenza A by PCR 07/18/2021 NEGATIVE  NEGATIVE Final   Influenza B by PCR 07/18/2021 NEGATIVE  NEGATIVE Final   Comment: (NOTE) The Xpert Xpress SARS-CoV-2/FLU/RSV plus assay is intended as an aid in the diagnosis of influenza from Nasopharyngeal swab specimens and should not be used as a sole basis for treatment. Nasal washings and aspirates are unacceptable for Xpert Xpress SARS-CoV-2/FLU/RSV testing.  Fact Sheet for  Patients: EntrepreneurPulse.com.au  Fact Sheet for Healthcare Providers: IncredibleEmployment.be  This test is not yet approved or cleared by the [place] FDA and has been authorized for detection and/or diagnosis of SARS-CoV-2 by FDA under an Emergency Use Authorization (EUA). This EUA will remain in effect (meaning this test can be used) for the duration of the COVID-19 declaration under Section 564(b)(1) of the Act, 21 U.S.C. section 360bbb-3(b)(1), unless the authorization is terminated or revoked.  Performed at [facility], [place] [address], [place], [place] [address]    Sodium 07/18/2021 138  135 - 145 mmol/L Final   Potassium 07/18/2021 3.8  3.5 - 5.1 mmol/L Final   Chloride 07/18/2021 109  98 - 111 mmol/L Final   CO2 07/18/2021 22  22 - 32 mmol/L Final   Glucose, Bld 07/18/2021 99  70 - 99 mg/dL Final   Glucose reference range applies only to samples taken after fasting for at least 8 hours.   BUN 07/18/2021 15  6 - 20 mg/dL Final   Creatinine, Ser 07/18/2021 0.60  0.44 - 1.00 mg/dL Final   Calcium 07/18/2021 9.2  8.9 - 10.3 mg/dL Final   Total Protein 07/18/2021 6.8  6.5 - 8.1 g/dL Final   Albumin 07/18/2021 3.9  3.5 - 5.0 g/dL Final   AST 07/18/2021 16  15 - 41 U/L Final   ALT 07/18/2021 16  0 - 44 U/L Final   Alkaline Phosphatase 07/18/2021 56  38 - 126 U/L Final   Total Bilirubin 07/18/2021 0.3  0.3 - 1.2 mg/dL Final   GFR, Estimated 07/18/2021 >60  >60 mL/min Final   Comment: (NOTE) Calculated using the CKD-EPI Creatinine Equation (2021)    Anion gap 07/18/2021 7  5 - 15 Final   Performed at [facility] [address], [place], [place] [address]   Alcohol, Ethyl (B) 07/18/2021 <10  <10 mg/dL Final   Comment: (NOTE) Lowest detectable limit for serum alcohol is 10 mg/dL.  For medical purposes only. Performed at [facility], [place] [address], [place], [place] [address]    Opiates 07/18/2021 NONE DETECTED   NONE DETECTED Final   Cocaine 07/18/2021 NONE DETECTED  NONE DETECTED Final   Benzodiazepines 07/18/2021 NONE DETECTED  NONE DETECTED Final   Amphetamines 07/18/2021 NONE DETECTED  NONE DETECTED Final   Tetrahydrocannabinol 07/18/2021 NONE DETECTED  NONE DETECTED Final   Barbiturates 07/18/2021 NONE DETECTED  NONE DETECTED Final   Comment: (NOTE) DRUG SCREEN FOR MEDICAL PURPOSES ONLY.  IF CONFIRMATION IS NEEDED FOR ANY PURPOSE, NOTIFY LAB WITHIN 5 DAYS.  LOWEST DETECTABLE LIMITS FOR URINE DRUG SCREEN Drug Class                     Cutoff (ng/mL) Amphetamine and metabolites    1000 Barbiturate and metabolites    200 Benzodiazepine                 962 Tricyclics and metabolites     300 Opiates and metabolites        300 Cocaine and metabolites        300 THC                            50 Performed at [facility], [place] [address], [place], [place] [address]    WBC 07/18/2021 8.4  4.0 - 10.5 K/uL Final   RBC 07/18/2021 4.46  3.87 - 5.11 MIL/uL Final   Hemoglobin 07/18/2021 11.7 (L)  12.0 - 15.0 g/dL Final   HCT 07/18/2021 37.5  36.0 - 46.0 % Final   MCV 07/18/2021 84.1  80.0 - 100.0 fL Final   MCH 07/18/2021 26.2  26.0 - 34.0 pg Final   MCHC 07/18/2021 31.2  30.0 - 36.0 g/dL Final   RDW 07/18/2021 14.3  11.5 - 15.5 % Final   Platelets 07/18/2021 239  150 - 400 K/uL Final   REPEATED TO VERIFY   nRBC 07/18/2021 0.0  0.0 - 0.2 % Final   Neutrophils Relative % 07/18/2021 58  % Final   Neutro Abs 07/18/2021 5.0  1.7 - 7.7 K/uL Final   Lymphocytes Relative 07/18/2021 34  % Final   Lymphs Abs 07/18/2021 2.8  0.7 - 4.0 K/uL Final   Monocytes Relative 07/18/2021 5  % Final   Monocytes Absolute 07/18/2021 0.4  0.1 - 1.0 K/uL Final   Eosinophils Relative 07/18/2021 1  % Final   Eosinophils Absolute 07/18/2021 0.0  0.0 - 0.5 K/uL Final   Basophils Relative 07/18/2021 1  % Final   Basophils Absolute 07/18/2021 0.0  0.0 - 0.1 K/uL Final   Immature Granulocytes 07/18/2021 1  % Final    Abs Immature Granulocytes 07/18/2021 0.04  0.00 - 0.07 K/uL Final   Performed at [facility], [place] [address], [place], [place] [address]   I-stat hCG, quantitative 07/18/2021 <5.0  <5 mIU/mL Final   Comment 3 07/18/2021          Final   Comment:   GEST. AGE      CONC.  (mIU/mL)   <=1 WEEK        5 - 50     2 WEEKS       50 - 500     3 WEEKS       100 - 10,000     4 WEEKS     1,000 - 30,000        FEMALE AND NON-PREGNANT FEMALE:     LESS THAN 5 mIU/mL   Admission on 07/15/2021, Discharged on 07/17/2021  Component Date Value Ref Range Status   SARS Coronavirus 2 by RT PCR 07/15/2021 NEGATIVE  NEGATIVE Final   Comment: (NOTE) SARS-CoV-2 target nucleic acids are NOT DETECTED.  The SARS-CoV-2 RNA is generally detectable in upper respiratory specimens during the acute phase of infection. The lowest concentration of SARS-CoV-2 viral copies this assay can detect is 138 copies/mL. A negative result does not preclude SARS-Cov-2 infection and should not be used as the sole basis for treatment or other patient management decisions. A negative result may occur with  improper specimen collection/handling, submission of specimen other than nasopharyngeal swab, presence of viral mutation(s) within the areas targeted by this assay, and inadequate number of viral copies(<138 copies/mL). A negative result must be combined with clinical observations, patient history, and epidemiological information. The expected result is Negative.  Fact Sheet for Patients:  EntrepreneurPulse.com.au  Fact Sheet for Healthcare Providers:  IncredibleEmployment.be  This test is no                          t yet approved or cleared by the [place] FDA and  has been authorized for detection and/or diagnosis of SARS-CoV-2 by FDA under an Emergency Use Authorization (EUA). This EUA will remain  in effect (meaning this test can be used) for the duration of the  COVID-19  declaration under Section 564(b)(1) of the Act, 21 U.S.C.section 360bbb-3(b)(1), unless the authorization is terminated  or revoked sooner.       Influenza A by PCR 07/15/2021 NEGATIVE  NEGATIVE Final   Influenza B by PCR 07/15/2021 NEGATIVE  NEGATIVE Final   Comment: (NOTE) The Xpert Xpress SARS-CoV-2/FLU/RSV plus assay is intended as an aid in the diagnosis of influenza from Nasopharyngeal swab specimens and should not be used as a sole basis for treatment. Nasal washings and aspirates are unacceptable for Xpert Xpress SARS-CoV-2/FLU/RSV testing.  Fact Sheet for Patients: EntrepreneurPulse.com.au  Fact Sheet for Healthcare Providers: IncredibleEmployment.be  This test is not yet approved or cleared by the [place] FDA and has been authorized for detection and/or diagnosis of SARS-CoV-2 by FDA under an Emergency Use Authorization (EUA). This EUA will remain in effect (meaning this test can be used) for the duration of the COVID-19 declaration under Section 564(b)(1) of the Act, 21 U.S.C. section 360bbb-3(b)(1), unless the authorization is terminated or revoked.  Performed at [facility], [place] [address], [place], [place] [address]    POC Amphetamine UR 07/15/2021 None Detected  NONE DETECTED (Cut Off Level 1000 ng/mL) Final   POC Secobarbital (BAR) 07/15/2021 None Detected  NONE DETECTED (Cut Off Level 300 ng/mL) Final   POC Buprenorphine (BUP) 07/15/2021 None Detected  NONE DETECTED (Cut Off Level 10 ng/mL) Final   POC Oxazepam (BZO) 07/15/2021 Positive (A)  NONE DETECTED (Cut Off Level 300 ng/mL) Final   POC Cocaine UR 07/15/2021 None Detected  NONE DETECTED (Cut Off Level 300 ng/mL) Final   POC Methamphetamine UR 07/15/2021 None Detected  NONE DETECTED (Cut Off Level 1000 ng/mL) Final   POC Morphine 07/15/2021 None Detected  NONE DETECTED (Cut Off Level 300 ng/mL) Final   POC Methadone UR 07/15/2021 None Detected  NONE DETECTED  (Cut Off Level 300 ng/mL) Final   POC Oxycodone UR 07/15/2021 None Detected  NONE DETECTED (Cut Off Level 100 ng/mL) Final   POC Marijuana UR 07/15/2021 None Detected  NONE DETECTED (Cut Off Level 50 ng/mL) Final   SARSCOV2ONAVIRUS 2 AG 07/15/2021 NEGATIVE  NEGATIVE Final   Comment: (NOTE) SARS-CoV-2 antigen NOT DETECTED.   Negative results are presumptive.  Negative results do not preclude SARS-CoV-2 infection and should not be used as the sole basis for treatment or other patient management decisions, including infection  control decisions, particularly in the presence of clinical signs and  symptoms consistent with COVID-19, or in those who have been in contact with the virus.  Negative results must be combined with clinical observations, patient history, and epidemiological information. The expected result is Negative.  Fact Sheet for Patients: HandmadeRecipes.com.cy  Fact Sheet for Healthcare Providers: FuneralLife.at  This test is not yet approved or cleared by the [place] FDA and  has been authorized for detection and/or diagnosis of SARS-CoV-2 by FDA under an Emergency Use Authorization (EUA).  This EUA will remain in effect (meaning this test can be used) for the duration of  the COV                          ID-19 declaration under Section 564(b)(1) of the Act, 21 U.S.C. section 360bbb-3(b)(1), unless the authorization is terminated or revoked sooner.     Preg Test, Ur 07/15/2021 NEGATIVE  NEGATIVE Final   Comment:        THE SENSITIVITY OF THIS METHODOLOGY IS >24 mIU/mL   Admission on  07/12/2021, Discharged on 07/14/2021  Component Date Value Ref Range Status   SARS Coronavirus 2 by RT PCR 07/12/2021 NEGATIVE  NEGATIVE Final   Comment: (NOTE) SARS-CoV-2 target nucleic acids are NOT DETECTED.  The SARS-CoV-2 RNA is generally detectable in upper respiratory specimens during the acute phase of infection. The  lowest concentration of SARS-CoV-2 viral copies this assay can detect is 138 copies/mL. A negative result does not preclude SARS-Cov-2 infection and should not be used as the sole basis for treatment or other patient management decisions. A negative result may occur with  improper specimen collection/handling, submission of specimen other than nasopharyngeal swab, presence of viral mutation(s) within the areas targeted by this assay, and inadequate number of viral copies(<138 copies/mL). A negative result must be combined with clinical observations, patient history, and epidemiological information. The expected result is Negative.  Fact Sheet for Patients:  EntrepreneurPulse.com.au  Fact Sheet for Healthcare Providers:  IncredibleEmployment.be  This test is no                          t yet approved or cleared by the [place] FDA and  has been authorized for detection and/or diagnosis of SARS-CoV-2 by FDA under an Emergency Use Authorization (EUA). This EUA will remain  in effect (meaning this test can be used) for the duration of the COVID-19 declaration under Section 564(b)(1) of the Act, 21 U.S.C.section 360bbb-3(b)(1), unless the authorization is terminated  or revoked sooner.       Influenza A by PCR 07/12/2021 NEGATIVE  NEGATIVE Final   Influenza B by PCR 07/12/2021 NEGATIVE  NEGATIVE Final   Comment: (NOTE) The Xpert Xpress SARS-CoV-2/FLU/RSV plus assay is intended as an aid in the diagnosis of influenza from Nasopharyngeal swab specimens and should not be used as a sole basis for treatment. Nasal washings and aspirates are unacceptable for Xpert Xpress SARS-CoV-2/FLU/RSV testing.  Fact Sheet for Patients: EntrepreneurPulse.com.au  Fact Sheet for Healthcare Providers: IncredibleEmployment.be  This test is not yet approved or cleared by the [place] FDA and has been authorized for  detection and/or diagnosis of SARS-CoV-2 by FDA under an Emergency Use Authorization (EUA). This EUA will remain in effect (meaning this test can be used) for the duration of the COVID-19 declaration under Section 564(b)(1) of the Act, 21 U.S.C. section 360bbb-3(b)(1), unless the authorization is terminated or revoked.  Performed at [facility], [place] [address] [place], [place] [address]    POC Amphetamine UR 07/12/2021 None Detected  NONE DETECTED (Cut Off Level 1000 ng/mL) Final   POC Secobarbital (BAR) 07/12/2021 None Detected  NONE DETECTED (Cut Off Level 300 ng/mL) Final   POC Buprenorphine (BUP) 07/12/2021 None Detected  NONE DETECTED (Cut Off Level 10 ng/mL) Final   POC Oxazepam (BZO) 07/12/2021 Positive (A)  NONE DETECTED (Cut Off Level 300 ng/mL) Final   POC Cocaine UR 07/12/2021 None Detected  NONE DETECTED (Cut Off Level 300 ng/mL) Final   POC Methamphetamine UR 07/12/2021 None Detected  NONE DETECTED (Cut Off Level 1000 ng/mL) Final   POC Morphine 07/12/2021 None Detected  NONE DETECTED (Cut Off Level 300 ng/mL) Final   POC Methadone UR 07/12/2021 None Detected  NONE DETECTED (Cut Off Level 300 ng/mL) Final   POC Oxycodone UR 07/12/2021 None Detected  NONE DETECTED (Cut Off Level 100 ng/mL) Final   POC Marijuana UR 07/12/2021 None Detected  NONE DETECTED (Cut Off Level 50 ng/mL) Final   SARS Coronavirus 2  Ag 07/12/2021 Negative  Negative Preliminary   Preg Test, Ur 07/12/2021 NEGATIVE  NEGATIVE Final   Comment:        THE SENSITIVITY OF THIS METHODOLOGY IS >24 mIU/mL    SARSCOV2ONAVIRUS 2 AG 07/12/2021 NEGATIVE  NEGATIVE Final   Comment: (NOTE) SARS-CoV-2 antigen NOT DETECTED.   Negative results are presumptive.  Negative results do not preclude SARS-CoV-2 infection and should not be used as the sole basis for treatment or other patient management decisions, including infection  control decisions, particularly in the presence of clinical signs and  symptoms  consistent with COVID-19, or in those who have been in contact with the virus.  Negative results must be combined with clinical observations, patient history, and epidemiological information. The expected result is Negative.  Fact Sheet for Patients: HandmadeRecipes.com.cy  Fact Sheet for Healthcare Providers: FuneralLife.at  This test is not yet approved or cleared by the [place] FDA and  has been authorized for detection and/or diagnosis of SARS-CoV-2 by FDA under an Emergency Use Authorization (EUA).  This EUA will remain in effect (meaning this test can be used) for the duration of  the COV                          ID-19 declaration under Section 564(b)(1) of the Act, 21 U.S.C. section 360bbb-3(b)(1), unless the authorization is terminated or revoked sooner.    There may be more visits with results that are not included.    Blood Alcohol level:  Lab Results  Component Value Date   ETH <10 08/02/2021   ETH <10 16/11/9602    Metabolic Disorder Labs: Lab Results  Component Value Date   HGBA1C 5.0 09/02/2021   MPG 96.8 09/02/2021   MPG 99.67 03/02/2021   Lab Results  Component Value Date   PROLACTIN 66.2 (H) 03/02/2021   PROLACTIN 66.1 (H) 02/04/2020   Lab Results  Component Value Date   CHOL 245 (H) 05/01/2021   TRIG 169 (H) 05/01/2021   HDL 46 05/01/2021   CHOLHDL 5.3 05/01/2021   VLDL 34 05/01/2021   LDLCALC 165 (H) 05/01/2021   LDLCALC 102 (H) 04/24/2021    Therapeutic Lab Levels: No results found for: "LITHIUM" Lab Results  Component Value Date   VALPROATE 48 (L) 07/10/2015   VALPROATE 84 05/31/2015   No results found for: "CBMZ"  Physical Findings   AIMS    Flowsheet Row Admission (Discharged) from 07/25/2021 in [facility] 300B Admission (Discharged) from 07/04/2021 in [place] 400B Admission (Discharged) from 04/21/2021 in [place] 300B Admission (Discharged) from 02/04/2020 in [place] 300B Admission (Discharged) from 01/17/2020 in [facility] 300B  AIMS Total Score 0 5 0 0 0      AUDIT    Flowsheet Row Admission (Discharged) from 07/25/2021 in [place] 300B Admission (Discharged) from 07/04/2021 in [place] 400B Admission (Discharged) from 02/04/2020 in [place] 300B Admission (Discharged) from 01/17/2020 in [place] 300B Admission (Discharged) from 07/08/2019 in  [place] 300B  Alcohol Use Disorder Identification Test Final Score (AUDIT) 0 0 0 0 0      GAD-7    Flowsheet Row Office Visit from 08/20/2016 in [place] for [facility] from 04/03/2016 in [place] for [facility] Routine Prenatal from  03/14/2016 in [place] for [facility] Routine Prenatal from 03/08/2016 in [place] for [facility] Routine Prenatal from 03/01/2016 in [place] for [facility]  Total GAD-7 Score '1 19 13 9 5      '$ PHQ2-9    [place] ED from 06/26/2021 in [place] ED from 05/01/2021 in [facility] ED from 02/03/2020 in [facility] ED from 07/26/2019 in [facility] Office Visit from 08/20/2016 in [place] for [facility]  PHQ-2 Total Score '3 5 6 5 '$ 0  PHQ-9 Total Score '9 17 27 15 1      '$ Flowsheet Row ED from 09/02/2021 in [facility] ED from 08/01/2021 in [facility] ED from 07/29/2021 in [place] CATEGORY High Risk Error: Q3, 4, or 5 should  not be populated when Q2 is No High Risk        Musculoskeletal  Strength & Muscle Tone: within normal limits Gait & Station: normal Patient leans: N/A  Psychiatric Specialty Exam  Presentation  General Appearance: Appropriate for Environment  Eye Contact:Fair  Speech:Clear and Coherent  Speech Volume:Decreased  Handedness:Ambidextrous   Mood and Affect  Mood:Dysphoric  Affect:Congruent   Thought Process  Thought Processes:Coherent; Goal Directed  Descriptions of Associations:Intact  Orientation:Full (Time, Place and Person)  Thought Content:Logical  Diagnosis of Schizophrenia or Schizoaffective disorder in past: Yes    Hallucinations:Hallucinations: None  Ideas of Reference:None  Suicidal Thoughts:Suicidal Thoughts: Yes, Active SI Active Intent and/or Plan: With Plan SI Passive Intent and/or Plan: Without Intent  Homicidal Thoughts:Homicidal Thoughts: No   Sensorium  Memory:Immediate Fair; Recent Fair; Remote Fair  Judgment:Poor  Insight:Shallow   Executive Functions  Concentration:Fair  Attention Span:Fair  [place]   Psychomotor Activity  Psychomotor Activity:Psychomotor Activity: Restlessness   Assets  Assets:Communication Skills; Desire for Improvement; Physical Health; Leisure Time; Financial Resources/Insurance   Sleep  Sleep:Sleep: Fair Number of Hours of Sleep: 5   Nutritional Assessment (For OBS and FBC admissions only) Has the patient had a weight loss or gain of 10 pounds or more in the last 3 months?: No Has the patient had a decrease in food intake/or appetite?: No Does the patient have dental problems?: No Does the patient have eating habits or behaviors that may be indicators of an eating disorder including binging or inducing vomiting?: No Has the patient recently lost weight without trying?: 0 Has the patient been eating poorly because of a decreased appetite?:  0 Malnutrition Screening Tool Score: 0    Physical Exam  Physical Exam HENT:     Head: Normocephalic.     Nose: Nose normal.  Eyes:     Conjunctiva/sclera: Conjunctivae normal.  Cardiovascular:     Rate and Rhythm: Normal rate.  Pulmonary:     Effort: Pulmonary effort is normal.  Musculoskeletal:        General: Normal range of motion.     Cervical back: Normal range of motion.  Neurological:     Mental Status: She is alert and oriented to person, place, and time.    Review of Systems  Constitutional: Negative.   HENT: Negative.    Eyes: Negative.   Respiratory: Negative.    Cardiovascular: Negative.   Gastrointestinal: Negative.   Genitourinary: Negative.   Musculoskeletal: Negative.   Skin: Negative.   Neurological: Negative.  Endo/Heme/Allergies: Negative.    Blood pressure 101/65, pulse 70, temperature 98.3 F (36.8 C), temperature source Oral, resp. rate 18, SpO2 98 %. There is no height or weight on file to calculate BMI.  Treatment Plan Summary: Daily contact with patient to assess and evaluate symptoms and progress in treatment, Medication management, and Plan Patient is recommended for inpatient treatment.   Home medications reviewed and restarted: Albuterol as needed for asthma  Vraylar 3 mg p.o. daily Voltaren get as needed for osteoarthritis Lexapro 15 mg p.o. daily Gabapentin 300 mg p.o. 3 times daily Vistaril 10 mg p.o. 3 times daily as needed for anxiety Minipress 1 mg p.o. nightly Propranolol 10 mg p.o. daily Seroquel 50 mg p.o. nightly Seroquel 12.5 mg p.o. in the a.m. daily  Labs reviewed and unremarkable.   Marissa Calamity, NP 09/03/2021 11:48 AM

## 2021-09-03 NOTE — Progress Notes (Signed)
Per Darrol Angel, NP, per secure chat, patient meets criteria for inpatient treatment. There are no available beds at [facility] today. CSW faxed referrals to the following facilities for review:  [address] [place] [address] [phone] --  [place] N/A [address], [place] [address] [phone] --  [facility] [address] [place] [address] [phone] --  [address] [place] [address] [phone] --  [place]  Pending - Request Sent N/A [place], [place] [address] [phone] --  [place] [address] [place] [address] [phone] --  [facility]  Pending - Request Sent N/A [address], [place] [address] [place] N/A [address], [place] [address] [phone] --  [facility]  Pending - Request Sent N/A [address] [place] [address] [phone] --  [facility]  Pending - Request Sent N/A [address] [place] [address] [phone] --  [facility]  Pending - Request Sent N/A [address], [place] [address] [phone] --  [facility]  Pending - Request Sent N/A [place], [place] [address] [phone] --  [facility]  Pending - Request Sent N/A [address] [place] [address] [phone] --  [facility]  Pending - Request Sent N/A [address], [place] [address] [phone] --  [facility]  Pending - Request Sent N/A [address] [place] [address] [phone] --   TTS will continue to seek bed placement.  Glennie [place], MSW, [place] Phone: [phone] Disposition/TOC

## 2021-09-03 NOTE — Progress Notes (Signed)
Patient informed discharge will be based on the information she told the provider during morning assessment. Patient walked away angrily and started to hit the wall with her hand. Patient not injured, but was told actions were in appropriate on an open unit with peers and patient stopped hitting the wall.

## 2021-09-03 NOTE — ED Notes (Signed)
Received patient this PM. Patient in her bed sleeping. Patient respirations are even and unlabored. Will continue to monitor for safety.

## 2021-09-03 NOTE — ED Notes (Signed)
Pt admitted to obs due to wanting to be admitted to a group home and SI with plan to OD on medications. Pt A&O x4, calm and cooperative. Pt denies HI/AVH. Pt tolerated lab work and skin assessment well. Pt ambulated independently to unit. Oriented to unit/staff. Sandwich, chips, and juice given per pt request. No signs of acute distress noted. Will continue to monitor for safety.

## 2021-09-03 NOTE — ED Notes (Signed)
Pt sleeping'@this'$  time. Breathing even and unlabored. Will continue to monitor for saftey

## 2021-09-03 NOTE — Progress Notes (Signed)
Patient informed RN, " I am ready to go, I am not going to hurt myself can you please let the Nurse practitioner lady know, because I am voluntary. I just needed some rest, and I am voluntary." RN informed provider.

## 2021-09-03 NOTE — Progress Notes (Signed)
CSW spoke with [facility] with [facility] who advised that currently there are no beds available.   Glennie Isle, MSW, [place] Phone: [phone] Disposition/TOC

## 2021-09-03 NOTE — Progress Notes (Signed)
Patient received medications late due to initially refusing medications.

## 2021-09-03 NOTE — Progress Notes (Signed)
RN received nursing report, patient currently resting in reclined chair, no acute distress observed. Respirations even and unlabored. Staff will continue to monitor.

## 2021-09-03 NOTE — Progress Notes (Signed)
RN informed patient of medication administration, patient states "I am too tired." Covers self with blanket; rolls over and goes to sleep.

## 2021-09-03 NOTE — ED Notes (Addendum)
Patient refused Ruthe Mannan said she did not need.  Patient also refused the Seroquel. Patient states that she needs to leave tomorrow and she feels the Seroquel made her groggy earlier and is the reason why she couldn't be discharged today.

## 2021-09-03 NOTE — Progress Notes (Signed)
Patient given dinner, no acute behaviors at this time. Nursing staff will continue to monitor.

## 2021-09-03 NOTE — ED Notes (Signed)
Patient to nurses station stating she wants to go home. Encouraged to wait til am and talk to the provider. Patient is sitting in chair rocking back and forth - will continue to monitor

## 2021-09-04 ENCOUNTER — Other Ambulatory Visit: Payer: Self-pay

## 2021-09-04 DIAGNOSIS — R45851 Suicidal ideations: Secondary | ICD-10-CM | POA: Diagnosis not present

## 2021-09-04 DIAGNOSIS — F603 Borderline personality disorder: Secondary | ICD-10-CM | POA: Diagnosis not present

## 2021-09-04 MED ORDER — ESCITALOPRAM OXALATE 5 MG PO TABS: 15.0000 mg | ORAL_TABLET | Freq: Every day | ORAL | 0 refills | Status: DC

## 2021-09-04 MED ORDER — ESCITALOPRAM OXALATE 10 MG PO TABS: 15.0000 mg | ORAL_TABLET | Freq: Every day | ORAL | Status: DC

## 2021-09-04 NOTE — ED Notes (Signed)
Pt sitting up in bed rocking back and forth this morning. Denies SI/HI/AVH. States she is ready to go home since she needs to go to work today.

## 2021-09-04 NOTE — Discharge Instructions (Addendum)
EMERGENCY Suicide hotline: 988 Emergency: [place]: [place]. [place], [place] [address]. [phone]  Dear Judson Roch,  Please call and talk to your outpatient psychiatrist for a lower dose of your night time seroquel 50 mg qHS. In the mean time, please break your seroquel '50mg'$  in half for seroquel 25 mg qHS I am proud of you for buying and working through your DBT workbook and talking about it with your therapist. Please make regular appointments with an outpatient psychiatrist and other doctors once you leave the hospital (if any, otherwise, please see below for resources to make an appointment).  For therapy outside the hospital, please ask for these specific types of therapy: DBT  To see which pharmacy near you is the CHEAPEST for certain medications, please use GoodRx. It is free website and has a free phone app.    Also consider looking at [facility] $4.00 or [facility]'s $7.00 prescription list. Both are free to view if googled "[facility] $4 prescription" and "public's $7 prescription". These are set prices, no insurance required. [facility]'s low cost medications: $4-$15 for 30days prescriptions or $10-$38 for 90days prescriptions  For issues with sleep, please use this free app for insomnia called CBT-I. Let your doctors and therapists know so they can help with extra tips and tricks or for guidance and accountability. NO ADDS on the app.      Good morning, Doneshia!  It is imperative that you follow through with treatment recommendations within 5-7 days from the day of discharge which means getting in touch with Butch Penny, the CST lead on your team at [facility].  There are intensive outpatient services that focus heavily on emotional regulation and distress tolerance while allowing you to have the freedom of living independently while enhancing self-compassion, self-care, self-love, and improve the overall quality of your life.  The point of these  services is to help mitigate having to be hospitalized multiple times.  Multiple hospitalizations will make it difficult to maintain a steady line of work, take care of your household, and enjoy the quality of life you want to have.  Below are resources tailored to your insurance coverage that could help you with working through trauma, PTSD symptoms, and increase your recovery and maintenance periods.  You are not limited to this list.  In case of an urgent emergency, you have the option of contacting the Mobile Crisis Unit with [facility] at [phone].  You can also dial 211 or go to www.nc211.org for additional community resources around [place].           [facility] - for PHP/IOP [address] [place], [place], [address] [phone] phone (Medicare, Private insurance except Tricare, [facility] [place], and [facility])  [facility] [address] [place], [place], [address] [phone] phone (Call to confirm insurance coverage) Consultation & Support Services     o Drop-In Hours: 1:00 PM to 5:00 PM     o Days: Monday - Thursday  Crisis Services (24/7)   [facility] [address] [place], [place], [address] [phone] phone [email or web address]  (to complete the intake form and upload ID and insurance cards)  [facility] [address] [place], [place], [address] [phone] phone (Sliding scale, Medicaid, call about other insurance coverage)  [facility] [address] [place], [place], [address] [phone] phone [phone] fax (Medicaid, Medicare, Self-pay,  call about other insurance coverage)  [place], [place], [address] [phone] phone (Medicare, Medicaid, [place], [place], call about other  insurance coverage)  [facility] [address] [place], [place], [address] [phone] phone [phone] fax (Call [phone] to see what insurance is accepted) Norma Fredrickson, MD specializes in geropsych)  [facility] [address] [place], [place], [address] [phone] phone (Call about insurance coverage)  [facility] [address] [place], [place], [address] [phone] phone [phone] fax (Call about insurance coverage)  [place] [address] [place], [place], [address] [phone] phone [phone] fax (Call about insurance coverage)   [facility] [address] [place], [place], [address] [phone] phone (Medicaid, Medicare, call about other insurance coverage)  [name] [address] [place], [place], [address] [phone] phone [phone] fax (Medicaid, Medicare, Tricare, call about other insurance coverage)   For your behavioral health needs, you are advised to follow up with an outpatient provider.  You may be eligible for ACT Team services, which would include more frequent visits with your provider, as well as in-home services.  The following providers offer ACT Team services.  Contact them at your earliest opportunity to ask about enrolling in their program:       [facility]      [address] [place], [place] [address]      [phone] [place]      [place], [place] [address]      [phone]       [facility] [address] [place], [place] [address]      [phone]       [facility] ACT [facility]      [facility]      [address]      [place], [place]  [address]      [phone]       [facility]      [address]      [place], [place] [address]      [phone]

## 2021-09-04 NOTE — ED Notes (Signed)
Patient states she is hearing voices in her ear "sounds like a radio". I advised patient that the Seroquel would help. She then said she would take a half of the ordered dose. The provider was notified of the request and ordered a half dose. Patient was compliant with medication administration.
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
## 2021-09-06 IMAGING — CR DG CHEST 2V
2 series · 2 of 2 positions shown · non-contrast
Comparison: 01/19/2019

CLINICAL DATA: Chest pain

EXAM:
CHEST - 2 VIEW

[chest lat]
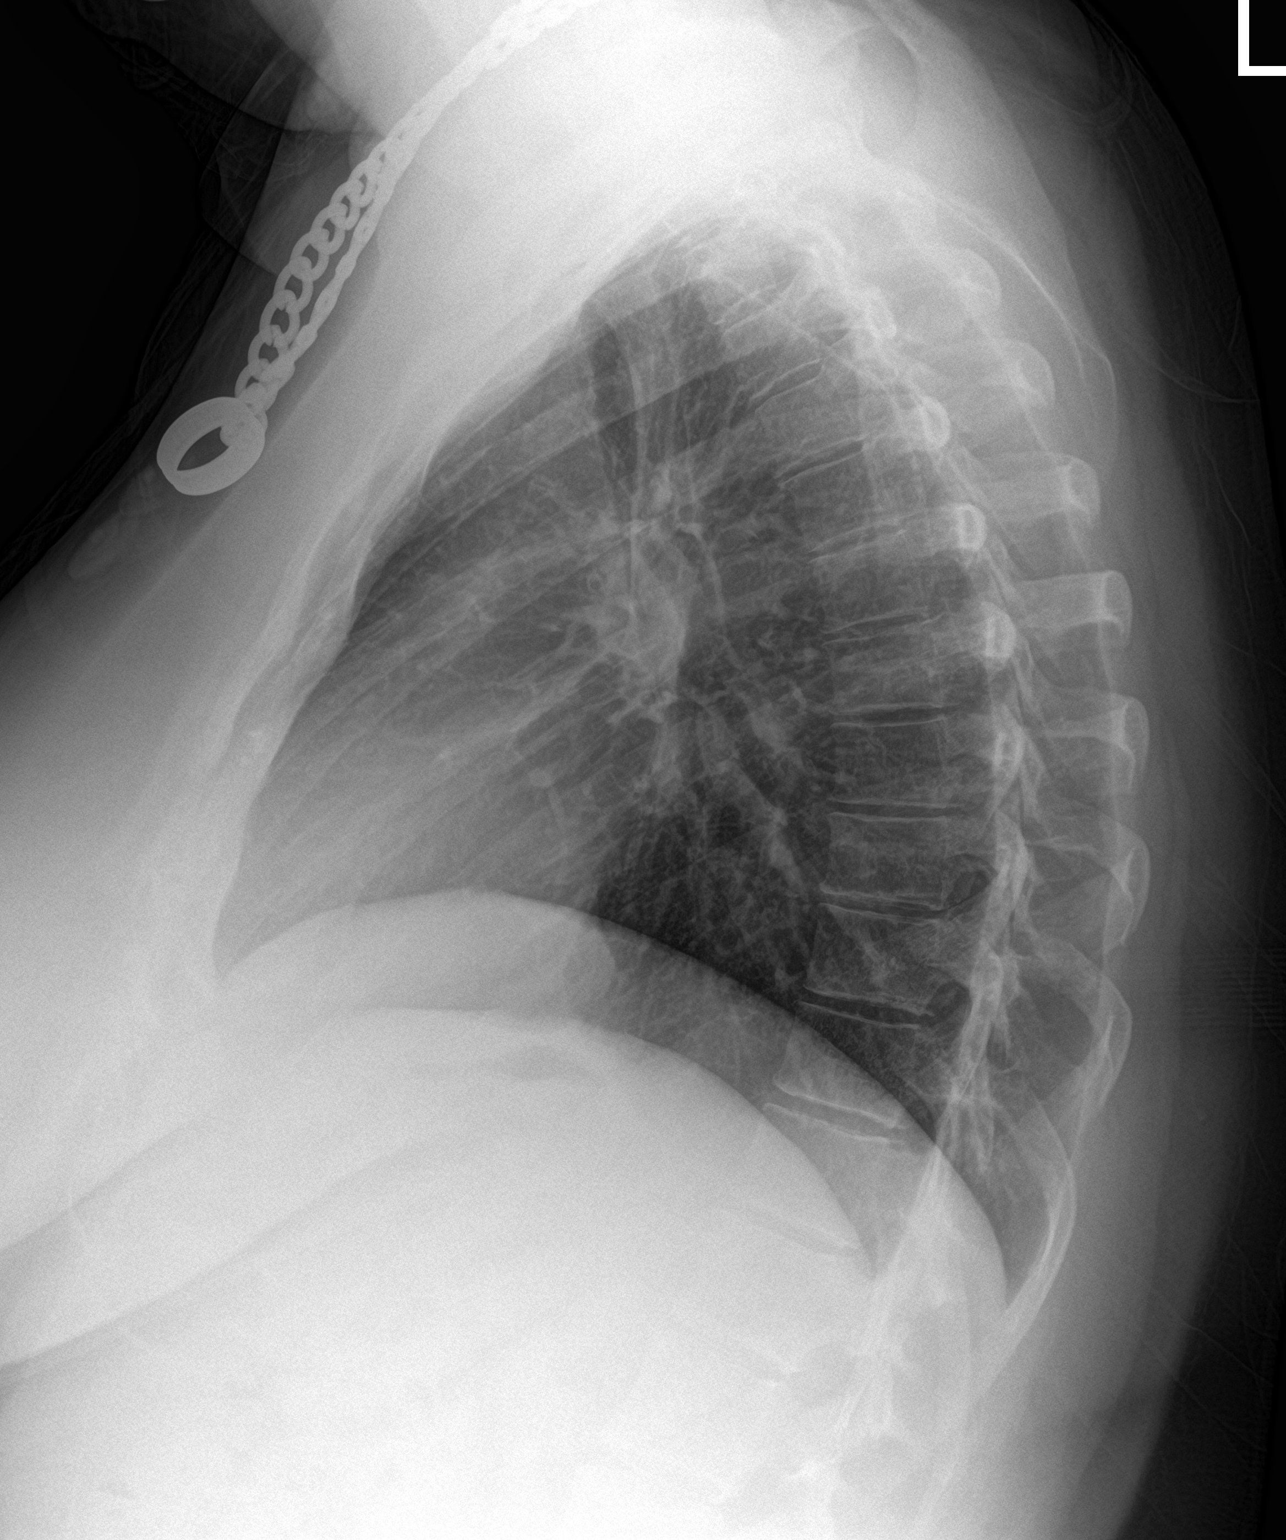

[chest pa]
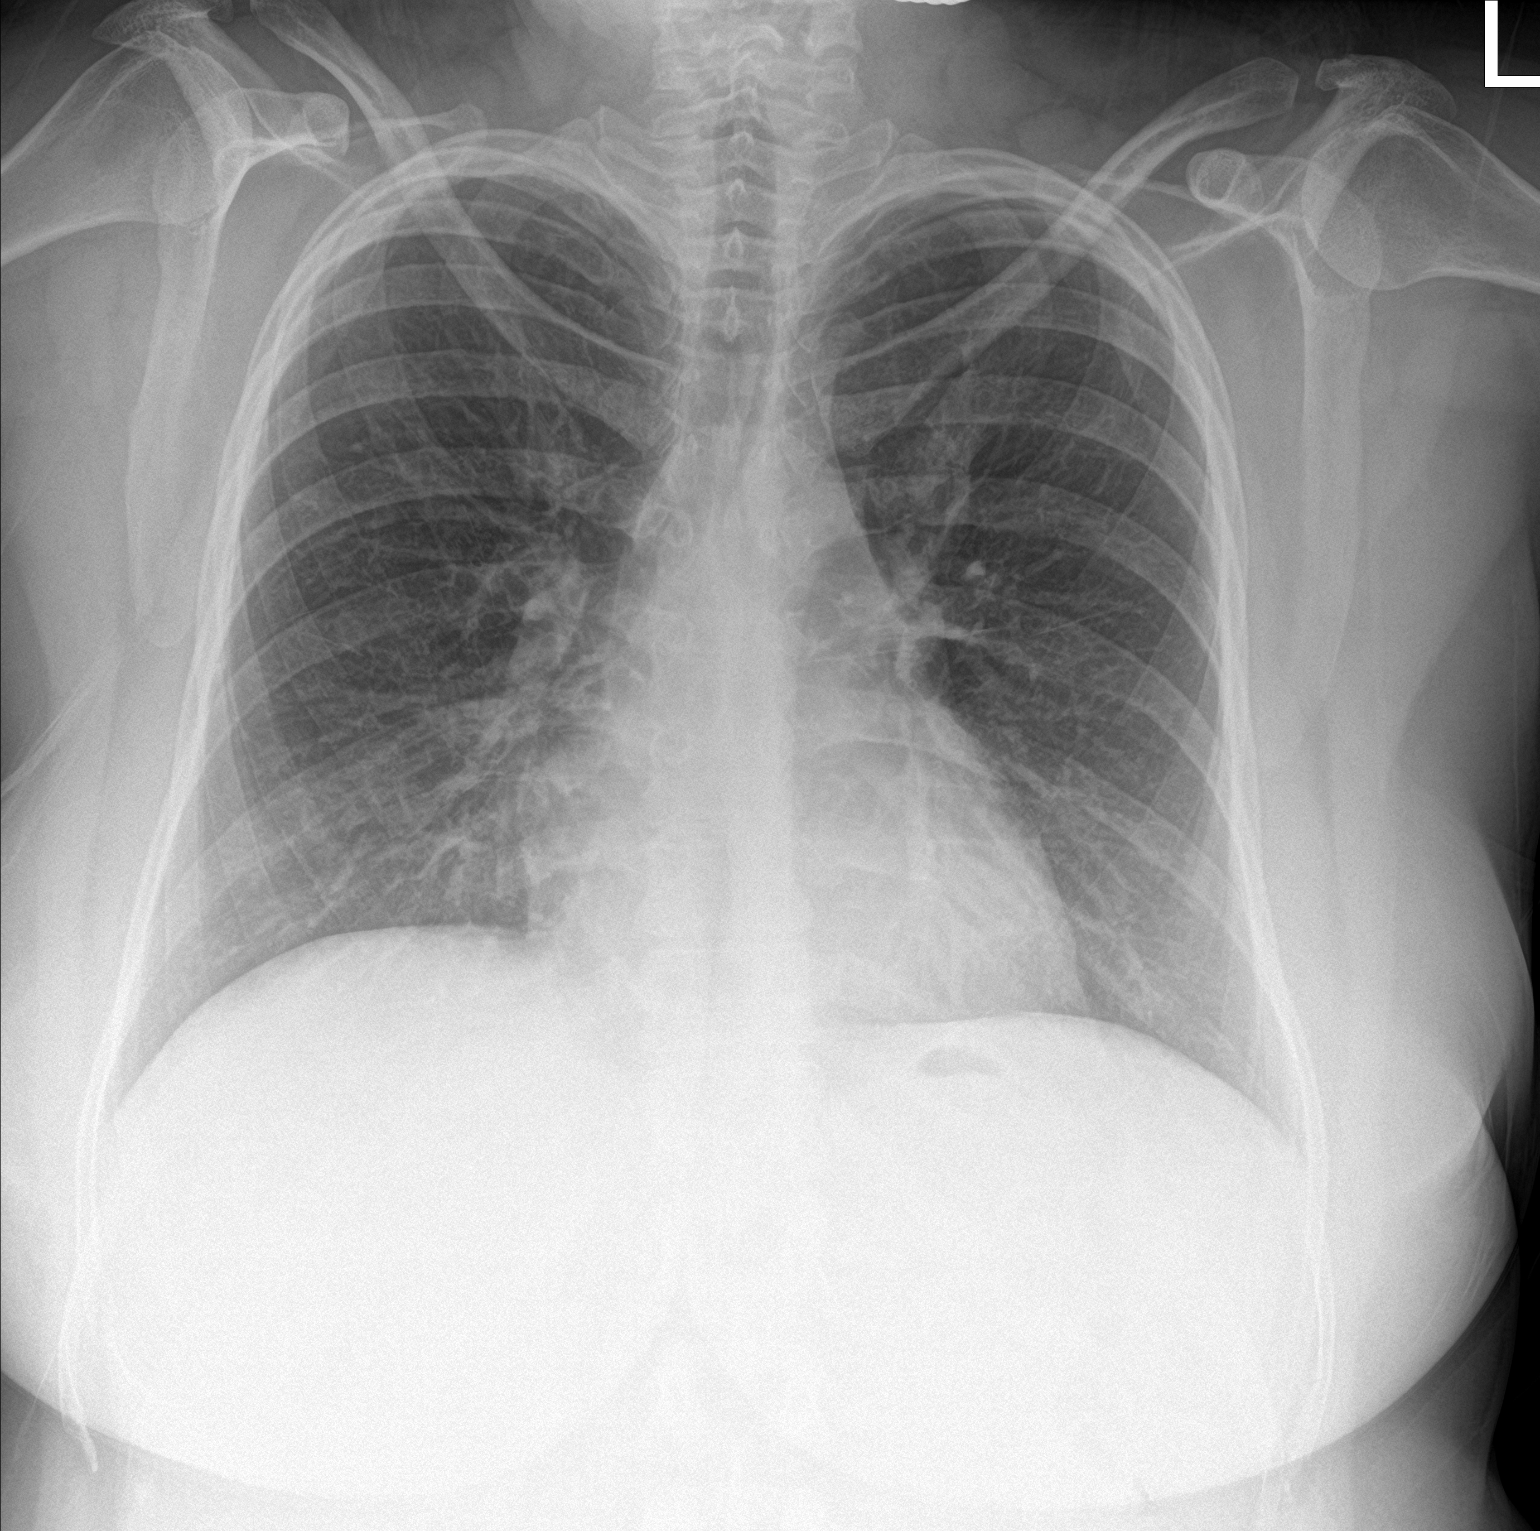

[2 of 2 positions shown; findings below may reference images not displayed]

FINDINGS: The heart size and mediastinal contours are within normal limits.
Both lungs are clear. The visualized skeletal structures are
unremarkable.
IMPRESSION: No active cardiopulmonary disease.

## 2021-09-11 DIAGNOSIS — F250 Schizoaffective disorder, bipolar type: Secondary | ICD-10-CM | POA: Diagnosis not present

## 2021-09-11 DIAGNOSIS — F1020 Alcohol dependence, uncomplicated: Secondary | ICD-10-CM | POA: Diagnosis not present

## 2021-09-11 DIAGNOSIS — F603 Borderline personality disorder: Secondary | ICD-10-CM | POA: Diagnosis not present

## 2021-09-11 DIAGNOSIS — F4310 Post-traumatic stress disorder, unspecified: Secondary | ICD-10-CM | POA: Diagnosis not present

## 2021-09-14 DIAGNOSIS — M797 Fibromyalgia: Secondary | ICD-10-CM | POA: Diagnosis not present

## 2021-09-14 DIAGNOSIS — F250 Schizoaffective disorder, bipolar type: Secondary | ICD-10-CM | POA: Diagnosis not present

## 2021-09-14 DIAGNOSIS — F22 Delusional disorders: Secondary | ICD-10-CM | POA: Diagnosis not present

## 2021-09-27 ENCOUNTER — Encounter (HOSPITAL_COMMUNITY): Payer: Self-pay

## 2021-09-27 ENCOUNTER — Emergency Department (HOSPITAL_BASED_OUTPATIENT_CLINIC_OR_DEPARTMENT_OTHER): Admit: 2021-09-27 | Discharge: 2021-09-27 | Disposition: A | Discharge status: Home / Self Care | Payer: PPO

## 2021-09-27 ENCOUNTER — Other Ambulatory Visit: Payer: Self-pay

## 2021-09-27 ENCOUNTER — Emergency Department (HOSPITAL_COMMUNITY)
Admission: EM | Admit: 2021-09-27 | Discharge: 2021-09-27 | Disposition: A | Discharge status: Home / Self Care | Payer: PPO | Attending: Emergency Medicine | Admitting: Emergency Medicine

## 2021-09-27 DIAGNOSIS — Z7951 Long term (current) use of inhaled steroids: Secondary | ICD-10-CM | POA: Diagnosis not present

## 2021-09-27 DIAGNOSIS — F840 Autistic disorder: Secondary | ICD-10-CM | POA: Insufficient documentation

## 2021-09-27 DIAGNOSIS — M79605 Pain in left leg: Secondary | ICD-10-CM | POA: Diagnosis not present

## 2021-09-27 DIAGNOSIS — R52 Pain, unspecified: Secondary | ICD-10-CM

## 2021-09-27 DIAGNOSIS — J45909 Unspecified asthma, uncomplicated: Secondary | ICD-10-CM | POA: Insufficient documentation

## 2021-09-27 DIAGNOSIS — R609 Edema, unspecified: Secondary | ICD-10-CM | POA: Diagnosis not present

## 2021-09-27 MED ORDER — GABAPENTIN 300 MG PO CAPS
300.0000 mg | ORAL_CAPSULE | Freq: Once | ORAL | Status: AC
  Administered 2021-09-27: 300 mg via ORAL
  Filled 2021-09-27: qty 1

## 2021-09-27 MED ORDER — IBUPROFEN 200 MG PO TABS
600.0000 mg | ORAL_TABLET | Freq: Once | ORAL | Status: AC
  Administered 2021-09-27: 600 mg via ORAL
  Filled 2021-09-27: qty 3

## 2021-09-27 NOTE — Progress Notes (Signed)
Left lower extremity venous duplex has been completed. Preliminary results can be found in CV Proc through chart review.  Results were given to Evlyn Courier PA.  09/27/21 4:19 PM Carlos Levering RVT

## 2021-09-27 NOTE — ED Triage Notes (Signed)
Pt BIB EMS from home. Pt endorses left leg pain that began last night that has become progressively worse today. Hx of DVT.

## 2021-09-27 NOTE — Discharge Instructions (Signed)
Your ultrasound did not show any evidence of blood clot.  After getting a dose of gabapentin, and ibuprofen your pain improved.  Denies chest pain with walking you are given crutches.  Continue taking ibuprofen as you need to.  Please follow-up with your primary care provider.  You can bear weight as you can tolerate.  For any worsening symptoms you can return to the emergency room.

## 2021-09-27 NOTE — ED Provider Notes (Signed)
[facility] DEPT Provider Note   CSN: [id number] Arrival date & time: 09/27/21  1511     History  Chief Complaint  Patient presents with   Leg Pain    [name] is a 31 y.o. female.  31 year old female with past medical history significant for psychiatric illness with recurrent visits to emergency room for cyst patients, and reported history of DVT in about 2016.  She states she took anticoagulation for 2 years and then self discontinued anticoagulation.  Reports compliance with all of her other home medications.  Denies chest pain, shortness of breath.  She also denies SI/HI/AVH.  She denies injury.  The history is provided by the patient. No language interpreter was used.       Home Medications Prior to Admission medications   Medication Sig Start Date End Date Taking? Authorizing Provider  albuterol (VENTOLIN HFA) 108 (90 Base) MCG/ACT inhaler Inhale 2 puffs into the lungs every 6 (six) hours as needed for wheezing or shortness of breath.    [provider]  BIOTIN PO Take 3 capsules by mouth daily.    [provider]  budesonide-formoterol (SYMBICORT) 160-4.5 MCG/ACT inhaler Inhale 2 puffs into the lungs 2 (two) times daily.    [provider]  calcium carbonate (TUMS EX) 750 MG chewable tablet Chew 1,500 mg by mouth 2 (two) times daily as needed for heartburn.    [provider]  cariprazine (VRAYLAR) 3 MG capsule Take 1 capsule (3 mg total) by mouth daily. 07/12/21   Massengill, Ovid Curd, MD  diclofenac Sodium (VOLTAREN) 1 % GEL Apply 2 g topically 4 (four) times daily. 06/23/21   Harris, Abigail, PA-C  escitalopram (LEXAPRO) 10 MG tablet Take 1.5 tablets (15 mg total) by mouth daily. 09/04/21   Merrily Brittle, DO  escitalopram (LEXAPRO) 5 MG tablet Take 3 tablets (15 mg total) by mouth daily. 09/05/21   Merrily Brittle, DO  gabapentin (NEURONTIN) 300 MG capsule Take 1 capsule (300 mg total) by mouth 3  (three) times daily for 7 days. 07/11/21 08/03/21  Massengill, Ovid Curd, MD  hydrOXYzine (ATARAX) 10 MG tablet Take 10 mg by mouth 3 (three) times daily as needed for anxiety.    [provider]  ibuprofen (ADVIL) 200 MG tablet Take 600 mg by mouth daily as needed for moderate pain or headache.    [provider]  Lidocaine-Menthol (ICY HOT MAX LIDOCAINE EX) Apply 1 Application topically daily as needed (pain).    [provider]  melatonin 3 MG TABS tablet Take 3 mg by mouth at bedtime.    [provider]  Misc Natural Products (OSTEO BI-FLEX TRIPLE STRENGTH PO) Take 1 capsule by mouth daily.    [provider]  naproxen sodium (ALEVE) 220 MG tablet Take 220 mg by mouth 2 (two) times daily as needed (back pain).    [provider]  pantoprazole (PROTONIX) 40 MG tablet TAKE 1 TABLET BY MOUTH DAILY Patient taking differently: 40 mg daily. 04/26/21   Jeanie Sewer, NP  prazosin (MINIPRESS) 1 MG capsule Take 1 capsule (1 mg total) by mouth at bedtime for 7 days. 07/28/21 08/04/21  Massengill, Ovid Curd, MD  propranolol (INDERAL) 10 MG tablet Take 1 tablet (10 mg total) by mouth 2 (two) times daily. 07/11/21   Massengill, Ovid Curd, MD  QUEtiapine (SEROQUEL) 25 MG tablet Take 12.5 mg by mouth daily.    [provider]      Allergies    Bee venom, Coconut flavor,  Fish allergy, Geodon [ziprasidone hcl], Haloperidol and related, Lithobid [lithium], Other, Roxicodone [oxycodone], Seroquel [quetiapine], Shellfish allergy, Phenergan [promethazine hcl], Prilosec [omeprazole], Sulfa antibiotics, Tegretol [carbamazepine], Prozac [fluoxetine], Tape, and Tylenol [acetaminophen]    Review of Systems   Review of Systems  Constitutional:  Negative for chills and fever.  Respiratory:  Negative for shortness of breath.   Cardiovascular:  Negative for chest pain, palpitations and leg swelling.  Musculoskeletal:  Positive for arthralgias.  All other systems  reviewed and are negative.   Physical Exam Updated Vital Signs BP 110/64 (BP Location: Right Arm)   Pulse 81   Temp 98.8 F (37.1 C) (Oral)   Resp 16   SpO2 100%  Physical Exam Vitals and nursing note reviewed.  Constitutional:      General: She is not in acute distress.    Appearance: Normal appearance. She is not ill-appearing.  HENT:     Head: Normocephalic and atraumatic.     Nose: Nose normal.  Eyes:     Conjunctiva/sclera: Conjunctivae normal.  Cardiovascular:     Rate and Rhythm: Normal rate and regular rhythm.     Pulses: Normal pulses.  Pulmonary:     Effort: Pulmonary effort is normal. No respiratory distress.     Breath sounds: Normal breath sounds. No wheezing or rales.  Musculoskeletal:        General: No deformity.     Right lower leg: No edema.     Left lower leg: No edema.     Comments: Bilateral lower extremities without edema.  Old bruising noted to bilateral lower extremities.  2+ DP pulse present bilaterally.  Tenderness to palpation present posterior to the left knee.  Knee joint otherwise without tenderness to palpation.  Skin:    Findings: No rash.  Neurological:     Mental Status: She is alert.     ED Results / Procedures / Treatments   Labs (all labs ordered are listed, but only abnormal results are displayed) Labs Reviewed - No data to display  EKG None  Radiology No results found.  Procedures Procedures    Medications Ordered in ED Medications - No data to display  ED Course/ Medical Decision Making/ A&P                           Medical Decision Making Risk OTC drugs. Prescription drug management.   Medical Decision Making / ED Course   This patient presents to the ED for concern of lower extremity pain, this involves an extensive number of treatment options, and is a complaint that carries with it a high risk of complications and morbidity.  The differential diagnosis includes DVT, muscle strain, joint  injury  MDM: 31 year old female with past medical history is significant for remote DVT in 2016.  States after 2 years of anticoagulation she self discontinued this.  Denies recent surgery, recent long travel.  On exam patient does not have asymmetrical swelling.  Bilateral lower extremity without pitting edema.  She denies chest pain, or shortness of breath raising concern for PE.  She denies SI/HI/AVH.  We will provide Motrin, gabapentin for pain relief. Reevaluation patient reports significant improvement in pain.  Reports antalgic gait.  Ambulated patient.  She exhibited antalgic gait.  Pain with bearing weight.  Again no injury or tenderness to palpation of the left knee.  Mild tenderness palpation posterior of left knee.  We will provide crutches.  Symptomatic management discussed.  Discussed importance  of follow-up with PCP.  Patient voices understanding and is in agreement with plan.  Patient is appropriate for discharge.  Discharged in stable condition.   Lab Tests: -I ordered, reviewed, and interpreted labs.   The pertinent results include:   Labs Reviewed - No data to display    EKG  EKG Interpretation  Date/Time:    Ventricular Rate:    PR Interval:    QRS Duration:   QT Interval:    QTC Calculation:   R Axis:     Text Interpretation:           Imaging Studies ordered: I ordered imaging studies including DVT study I independently visualized and interpreted imaging. I agree with the radiologist interpretation   Medicines ordered and prescription drug management: Meds ordered this encounter  Medications   gabapentin (NEURONTIN) capsule 300 mg   ibuprofen (ADVIL) tablet 600 mg    -I have reviewed the patients home medicines and have made adjustments as needed  Reevaluation: After the interventions noted above, I reevaluated the patient and found that they have :improved  Co morbidities that complicate the patient evaluation  Past Medical History:  Diagnosis  Date   Acid reflux    Anxiety    Asthma    last attack 03/13/15 or 03/14/15   Autism    Carrier of fragile X syndrome    Chronic constipation    Depression    Drug-seeking behavior    Essential tremor    Headache    Overdose of acetaminophen 07/2017   and other meds   Personality disorder ([place])    Schizo-affective psychosis ([place])    Schizoaffective disorder, bipolar type ([place])    Seizures ([place])    Last seizure December 2017   Sleep apnea       Dispostion: Patient is appropriate for discharge.  Discharged in stable condition.  Return precautions discussed.   Final Clinical Impression(s) / ED Diagnoses Final diagnoses:  Left leg pain    Rx / DC Orders ED Discharge Orders     None         Evlyn Courier, PA-C 09/27/21 1704    Dorie Rank, MD 09/27/21 1754

## 2021-10-02 DIAGNOSIS — E7800 Pure hypercholesterolemia, unspecified: Secondary | ICD-10-CM | POA: Diagnosis not present

## 2021-10-02 DIAGNOSIS — F22 Delusional disorders: Secondary | ICD-10-CM | POA: Diagnosis not present

## 2021-10-02 DIAGNOSIS — M797 Fibromyalgia: Secondary | ICD-10-CM | POA: Diagnosis not present

## 2021-10-02 DIAGNOSIS — F250 Schizoaffective disorder, bipolar type: Secondary | ICD-10-CM | POA: Diagnosis not present

## 2021-10-04 ENCOUNTER — Other Ambulatory Visit: Payer: Self-pay

## 2021-10-04 ENCOUNTER — Emergency Department (HOSPITAL_COMMUNITY)
Admission: EM | Admit: 2021-10-04 | Discharge: 2021-10-06 | Disposition: A | Discharge status: Other Acute Inpatient Hospital | Payer: PPO | Attending: Emergency Medicine | Admitting: Emergency Medicine

## 2021-10-04 ENCOUNTER — Encounter (HOSPITAL_COMMUNITY): Payer: Self-pay | Admitting: *Deleted

## 2021-10-04 DIAGNOSIS — Z20822 Contact with and (suspected) exposure to covid-19: Secondary | ICD-10-CM | POA: Diagnosis not present

## 2021-10-04 DIAGNOSIS — R45851 Suicidal ideations: Secondary | ICD-10-CM | POA: Insufficient documentation

## 2021-10-04 DIAGNOSIS — T50902A Poisoning by unspecified drugs, medicaments and biological substances, intentional self-harm, initial encounter: Secondary | ICD-10-CM | POA: Diagnosis not present

## 2021-10-04 DIAGNOSIS — F840 Autistic disorder: Secondary | ICD-10-CM | POA: Insufficient documentation

## 2021-10-04 DIAGNOSIS — F845 Asperger's syndrome: Secondary | ICD-10-CM | POA: Diagnosis present

## 2021-10-04 DIAGNOSIS — J45909 Unspecified asthma, uncomplicated: Secondary | ICD-10-CM | POA: Insufficient documentation

## 2021-10-04 DIAGNOSIS — F603 Borderline personality disorder: Secondary | ICD-10-CM | POA: Insufficient documentation

## 2021-10-04 DIAGNOSIS — X838XXA Intentional self-harm by other specified means, initial encounter: Secondary | ICD-10-CM | POA: Diagnosis not present

## 2021-10-04 DIAGNOSIS — R9431 Abnormal electrocardiogram [ECG] [EKG]: Secondary | ICD-10-CM | POA: Diagnosis not present

## 2021-10-04 DIAGNOSIS — R42 Dizziness and giddiness: Secondary | ICD-10-CM | POA: Insufficient documentation

## 2021-10-04 DIAGNOSIS — T1491XA Suicide attempt, initial encounter: Secondary | ICD-10-CM | POA: Diagnosis not present

## 2021-10-04 DIAGNOSIS — Z7951 Long term (current) use of inhaled steroids: Secondary | ICD-10-CM | POA: Insufficient documentation

## 2021-10-04 DIAGNOSIS — R442 Other hallucinations: Secondary | ICD-10-CM | POA: Diagnosis not present

## 2021-10-04 DIAGNOSIS — F314 Bipolar disorder, current episode depressed, severe, without psychotic features: Secondary | ICD-10-CM | POA: Diagnosis not present

## 2021-10-04 LAB — CBC WITH DIFFERENTIAL/PLATELET
Abs Immature Granulocytes: 0.01 10*3/uL (ref 0.00–0.07)
Basophils Absolute: 0 10*3/uL (ref 0.0–0.1)
Basophils Relative: 1 %
Eosinophils Absolute: 0 10*3/uL (ref 0.0–0.5)
Eosinophils Relative: 0 %
HCT: 33.9 % — ABNORMAL LOW (ref 36.0–46.0)
Hemoglobin: 10.9 g/dL — ABNORMAL LOW (ref 12.0–15.0)
Immature Granulocytes: 0 %
Lymphocytes Relative: 31 %
Lymphs Abs: 2.3 10*3/uL (ref 0.7–4.0)
MCH: 26 pg (ref 26.0–34.0)
MCHC: 32.2 g/dL (ref 30.0–36.0)
MCV: 80.9 fL (ref 80.0–100.0)
Monocytes Absolute: 0.3 10*3/uL (ref 0.1–1.0)
Monocytes Relative: 5 %
Neutro Abs: 4.6 10*3/uL (ref 1.7–7.7)
Neutrophils Relative %: 63 %
Platelets: 124 10*3/uL — ABNORMAL LOW (ref 150–400)
RBC: 4.19 MIL/uL (ref 3.87–5.11)
RDW: 13.2 % (ref 11.5–15.5)
WBC: 7.3 10*3/uL (ref 4.0–10.5)
nRBC: 0 % (ref 0.0–0.2)

## 2021-10-04 LAB — COMPREHENSIVE METABOLIC PANEL
ALT: 13 U/L (ref 0–44)
AST: 22 U/L (ref 15–41)
Albumin: 3.8 g/dL (ref 3.5–5.0)
Alkaline Phosphatase: 61 U/L (ref 38–126)
Anion gap: 4 — ABNORMAL LOW (ref 5–15)
BUN: <5 mg/dL — ABNORMAL LOW (ref 6–20)
CO2: 26 mmol/L (ref 22–32)
Calcium: 8.9 mg/dL (ref 8.9–10.3)
Chloride: 107 mmol/L (ref 98–111)
Creatinine, Ser: 0.78 mg/dL (ref 0.44–1.00)
GFR, Estimated: >60 mL/min (ref >60)
Glucose, Bld: 105 mg/dL — ABNORMAL HIGH (ref 70–99)
Potassium: 4.1 mmol/L (ref 3.5–5.1)
Sodium: 137 mmol/L (ref 135–145)
Total Bilirubin: 0.6 mg/dL (ref 0.3–1.2)
Total Protein: 6.2 g/dL — ABNORMAL LOW (ref 6.5–8.1)

## 2021-10-04 LAB — ETHANOL: Alcohol, Ethyl (B): <10 mg/dL (ref <10)

## 2021-10-04 LAB — RAPID URINE DRUG SCREEN, HOSP PERFORMED
Amphetamines: NOT DETECTED
Barbiturates: NOT DETECTED
Benzodiazepines: NOT DETECTED
Cocaine: NOT DETECTED
Opiates: NOT DETECTED
Tetrahydrocannabinol: NOT DETECTED

## 2021-10-04 LAB — ACETAMINOPHEN LEVEL: Acetaminophen (Tylenol), Serum: <10 ug/mL — ABNORMAL LOW (ref 10–30)

## 2021-10-04 LAB — SALICYLATE LEVEL: Salicylate Lvl: <7 mg/dL — ABNORMAL LOW (ref 7.0–30.0)

## 2021-10-04 MED ORDER — LACTATED RINGERS IV BOLUS: 1000.0000 mL | Freq: Once | INTRAVENOUS | Status: DC

## 2021-10-04 MED ORDER — LACTATED RINGERS IV BOLUS
1000.0000 mL | Freq: Once | INTRAVENOUS | Status: AC
  Administered 2021-10-04: 1000 mL via INTRAVENOUS

## 2021-10-04 MED ORDER — SODIUM CHLORIDE 0.9 % IV BOLUS (SEPSIS)
1000.0000 mL | Freq: Once | INTRAVENOUS | Status: AC
  Administered 2021-10-05: 1000 mL via INTRAVENOUS

## 2021-10-04 NOTE — ED Notes (Signed)
The pt is difficult at best  refusing to get dressed out the she did get dressed out.  Then refusing to have blood drawn  c/o dizziness since her arrival  her last admission was in June for the same beagvior as now

## 2021-10-04 NOTE — ED Provider Notes (Signed)
[facility] EMERGENCY DEPARTMENT Provider Note   CSN: [id number] Arrival date & time: 10/04/21  2041     History {Add pertinent medical, surgical, social history, OB history to HPI:1} Chief Complaint  Patient presents with   Ingestion    [name] is a 31 y.o. female.  HPI      15 tablets of Lexapro 10 mg, prazosin 1 mg, propranolol 10 mg, pantoprazole '40mg'$  , flexeril '10mg'$   Reports taking Seroquel, but not as much.  Reports she took 1 gabapentin tablet.  Two ibuprofen.  Cariprazine.    Home Medications Prior to Admission medications   Medication Sig Start Date End Date Taking? Authorizing Provider  albuterol (VENTOLIN HFA) 108 (90 Base) MCG/ACT inhaler Inhale 2 puffs into the lungs every 6 (six) hours as needed for wheezing or shortness of breath.    [provider]  BIOTIN PO Take 3 capsules by mouth daily.    [provider]  budesonide-formoterol (SYMBICORT) 160-4.5 MCG/ACT inhaler Inhale 2 puffs into the lungs 2 (two) times daily.    [provider]  calcium carbonate (TUMS EX) 750 MG chewable tablet Chew 1,500 mg by mouth 2 (two) times daily as needed for heartburn.    [provider]  cariprazine (VRAYLAR) 3 MG capsule Take 1 capsule (3 mg total) by mouth daily. 07/12/21   Massengill, Ovid Curd, MD  diclofenac Sodium (VOLTAREN) 1 % GEL Apply 2 g topically 4 (four) times daily. 06/23/21   Harris, Abigail, PA-C  escitalopram (LEXAPRO) 10 MG tablet Take 1.5 tablets (15 mg total) by mouth daily. 09/04/21   Merrily Brittle, DO  escitalopram (LEXAPRO) 5 MG tablet Take 3 tablets (15 mg total) by mouth daily. 09/05/21   Merrily Brittle, DO  gabapentin (NEURONTIN) 300 MG capsule Take 1 capsule (300 mg total) by mouth 3 (three) times daily for 7 days. 07/11/21 08/03/21  Massengill, Ovid Curd, MD  hydrOXYzine (ATARAX) 10 MG tablet Take 10 mg by mouth 3 (three) times daily as needed for anxiety.    [provider]  ibuprofen  (ADVIL) 200 MG tablet Take 600 mg by mouth daily as needed for moderate pain or headache.    [provider]  Lidocaine-Menthol (ICY HOT MAX LIDOCAINE EX) Apply 1 Application topically daily as needed (pain).    [provider]  melatonin 3 MG TABS tablet Take 3 mg by mouth at bedtime.    [provider]  Misc Natural Products (OSTEO BI-FLEX TRIPLE STRENGTH PO) Take 1 capsule by mouth daily.    [provider]  naproxen sodium (ALEVE) 220 MG tablet Take 220 mg by mouth 2 (two) times daily as needed (back pain).    [provider]  pantoprazole (PROTONIX) 40 MG tablet TAKE 1 TABLET BY MOUTH DAILY Patient taking differently: 40 mg daily. 04/26/21   Jeanie Sewer, NP  prazosin (MINIPRESS) 1 MG capsule Take 1 capsule (1 mg total) by mouth at bedtime for 7 days. 07/28/21 08/04/21  Massengill, Ovid Curd, MD  propranolol (INDERAL) 10 MG tablet Take 1 tablet (10 mg total) by mouth 2 (two) times daily. 07/11/21   Massengill, Ovid Curd, MD  QUEtiapine (SEROQUEL) 25 MG tablet Take 12.5 mg by mouth daily.    [provider]      Allergies    Bee venom, Coconut flavor, Fish allergy, Geodon [ziprasidone hcl], Haloperidol and related, Lithobid [lithium], Other, Roxicodone [oxycodone], Seroquel [quetiapine], Shellfish allergy, Phenergan [promethazine hcl], Prilosec [omeprazole], Sulfa antibiotics, Tegretol [carbamazepine], Prozac [fluoxetine], Tape, and Tylenol [  acetaminophen]    Review of Systems   Review of Systems  Physical Exam Updated Vital Signs BP 113/60 (BP Location: Left Arm)   Pulse 88   Temp 99.2 F (37.3 C) (Oral)   Resp 20   Ht '5\' 4"'$  (1.626 m)   Wt 92.6 kg   SpO2 100%   BMI 35.04 kg/m  Physical Exam  ED Results / Procedures / Treatments   Labs (all labs ordered are listed, but only abnormal results are displayed) Labs Reviewed - No data to display  EKG None  Radiology No results found.  Procedures Procedures  {Document cardiac  monitor, telemetry assessment procedure when appropriate:1}  Medications Ordered in ED Medications - No data to display  ED Course/ Medical Decision Making/ A&P                           Medical Decision Making Amount and/or Complexity of Data Reviewed Labs: ordered.   ***  CNS depression, drowsiness, seizures, hypotension Midnight tylenol level Repeat ECG 4AM  Minimum 12 hour monitoring    {Document critical care time when appropriate:1} {Document review of labs and clinical decision tools ie heart score, Chads2Vasc2 etc:1}  {Document your independent review of radiology images, and any outside records:1} {Document your discussion with family members, caretakers, and with consultants:1} {Document social determinants of health affecting pt's care:1} {Document your decision making why or why not admission, treatments were needed:1} Final Clinical Impression(s) / ED Diagnoses Final diagnoses:  None    Rx / DC Orders ED Discharge Orders     None

## 2021-10-04 NOTE — ED Notes (Signed)
The pt reports that she took multiple pills  30 minutes before she arrived and she also admits to eating  a meal around 1700

## 2021-10-04 NOTE — ED Notes (Signed)
The pt reports that ahe took every med that she takes except neurontin  she reports that she took flexeril 15 tablets seroquel  15 pills  this drug is listed as a allergy also  lopressor  30   and an anti acid

## 2021-10-04 NOTE — ED Triage Notes (Signed)
The pt arrived  by [facility] from somewhere  the pt told them she was attempting to kill herself by taking murtiple medications.  The pt is known by [facility] for the same behavior always takes the same combination of meds flexeril seroquel lopressor neurontin   she also told them that she had been hearing voices for 2-3 days

## 2021-10-05 DIAGNOSIS — T1491XA Suicide attempt, initial encounter: Secondary | ICD-10-CM

## 2021-10-05 DIAGNOSIS — F603 Borderline personality disorder: Secondary | ICD-10-CM | POA: Diagnosis not present

## 2021-10-05 LAB — RESP PANEL BY RT-PCR (FLU A&B, COVID) ARPGX2
Influenza A by PCR: NEGATIVE
Influenza B by PCR: NEGATIVE
SARS Coronavirus 2 by RT PCR: NEGATIVE

## 2021-10-05 LAB — I-STAT BETA HCG BLOOD, ED (MC, WL, AP ONLY): I-stat hCG, quantitative: <5 m[IU]/mL (ref <5)

## 2021-10-05 LAB — ACETAMINOPHEN LEVEL: Acetaminophen (Tylenol), Serum: <10 ug/mL — ABNORMAL LOW (ref 10–30)

## 2021-10-05 MED ORDER — LORAZEPAM 1 MG PO TABS
1.0000 mg | ORAL_TABLET | Freq: Three times a day (TID) | ORAL | Status: DC | PRN
  Administered 2021-10-05 – 2021-10-06 (×3): 1 mg via ORAL
  Filled 2021-10-05 (×3): qty 1

## 2021-10-05 MED ORDER — IBUPROFEN 800 MG PO TABS
800.0000 mg | ORAL_TABLET | Freq: Once | ORAL | Status: AC
  Administered 2021-10-05: 800 mg via ORAL
  Filled 2021-10-05: qty 1

## 2021-10-05 MED ORDER — DICLOFENAC SODIUM 1 % EX GEL
2.0000 g | Freq: Four times a day (QID) | CUTANEOUS | Status: DC | PRN
  Administered 2021-10-05 – 2021-10-06 (×2): 2 g via TOPICAL
  Filled 2021-10-05: qty 100

## 2021-10-05 MED ORDER — MOMETASONE FURO-FORMOTEROL FUM 200-5 MCG/ACT IN AERO
2.0000 | INHALATION_SPRAY | Freq: Two times a day (BID) | RESPIRATORY_TRACT | Status: DC
  Administered 2021-10-06: 2 via RESPIRATORY_TRACT
  Filled 2021-10-05: qty 8.8

## 2021-10-05 MED ORDER — MELATONIN 3 MG PO TABS
3.0000 mg | ORAL_TABLET | Freq: Every day | ORAL | Status: DC
  Administered 2021-10-05: 3 mg via ORAL
  Filled 2021-10-05: qty 1

## 2021-10-05 MED ORDER — QUETIAPINE FUMARATE 25 MG PO TABS
12.5000 mg | ORAL_TABLET | Freq: Every day | ORAL | Status: DC
  Administered 2021-10-05 – 2021-10-06 (×2): 12.5 mg via ORAL
  Filled 2021-10-05 (×2): qty 1

## 2021-10-05 MED ORDER — IBUPROFEN 400 MG PO TABS
400.0000 mg | ORAL_TABLET | Freq: Four times a day (QID) | ORAL | Status: DC | PRN
  Administered 2021-10-05 – 2021-10-06 (×2): 400 mg via ORAL
  Filled 2021-10-05 (×2): qty 1

## 2021-10-05 NOTE — Consult Note (Signed)
Telepsych Consultation   Reason for Consult:  Psychiatric Assessment for suicide attempt via overdose on pills Referring Physician:  Ripley Fraise, MD Location of Patient:    [name]  Location of Provider: Other: virtual home office  Patient Identification: [name] MRN:  [id number] Principal Diagnosis: Suicide attempt [facility]) Diagnosis:  Principal Problem:   Suicide attempt [facility]) Active Problems:   Borderline personality disorder ([place])   Autism   Bipolar 1 disorder, depressed, severe ([place])   Total Time spent with patient: 30 minutes  Subjective:   [name] is a 31 y.o. female patient admitted with suicide attempt via overdose on prescribed medications.  HPI:   Patient seen via telepsych by this provider; chart reviewed and consulted with Dr. Dwyane Dee on 10/05/21.  On evaluation [name] reports she was hearing voices and seeing things, occurred 3-4x last week; she does not elaborate on visual hallucinations. Pt endorses command hallucinations, "the devil telling me to kill myself."  Patient reports psychosocial stressors that triggered her.  Pt also states she had an argument with her ex boyfriend and her fiance; Also reports she has a court date pending for 11/27/2021 for an unpaid [employer] credit card which she is nervous about.  States above has made her very anxious and she and she has not been able to relax. Pt reports above concerns culminated in her taking multiple, "of my prescribed medications to end it. I took about one week and a half worth of my prescribed medications."    She reports she's been taking her mental healt meds lexapro, seroqel, prazosin and neurtontin, daily, denies side effects.  States her sleep has been good, sleeping at 5-6 hours daily which she describes as "normal for me."  Reports her appetite has been good.   When asked today, she endorses suicidal ideations, but now denies plan or intent.  She denies visual  or audible hallucinations.    Per ED Provider Admissions Assessment 10/04/2021: Chief Complaint  Patient presents with   Ingestion      [name] is a 31 y.o. female.   HPI       31 year old female with a history of borderline personality disorder, bipolar disorder, depression, multiple suicide attempts and frequent emergency department visits who presents with concern for suicidal ideation and overdose.   Reports she took 15 tablets of Lexapro 10 mg, prazosin 1 mg, propranolol 10 mg, pantoprazole '40mg'$  , flexeril '10mg'$   Reports taking Seroquel, but not as much.  Reports she took 1 gabapentin tablet.  Two ibuprofen.  Unclear amount of cariprazine.  She took these at 8 PM.   Reports feeling dizzy.  Reports she is not feeling SI now but did at that time. Has been hearing voices.    Past Psychiatric History: as outline above  Risk to Self:  yes Risk to Others:  no Prior Inpatient Therapy:  yes Prior Outpatient Therapy:  yes  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 fragile X syndrome    Chronic constipation    Depression    Drug-seeking behavior    Essential tremor    Headache    Overdose of acetaminophen 07/2017   and other meds   Personality disorder ([place])    Schizo-affective psychosis ([place])    Schizoaffective disorder, bipolar type ([place])  Seizures ([place])    Last seizure December 2017   Sleep apnea     Past Surgical History:  Procedure Laterality Date   MOUTH SURGERY  2009 or 2010   Family History:  Family History  Problem Relation Age of Onset   Mental illness Father    Asthma Father    PDD Brother    Seizures Brother    Family Psychiatric  History: unknown Social History:  Social History   Substance and Sexual Activity  Alcohol Use No   Alcohol/week: 1.0 standard drink of alcohol   Types: 1 Standard drinks or equivalent per  week   Comment: denies at this time     Social History   Substance and Sexual Activity  Drug Use No   Comment: History of cocaine use at age 70 for 4 months    Social History   Socioeconomic History   Marital status: Widowed    Spouse name: Not on file   Number of children: 0   Years of education: Not on file   Highest education level: Not on file  Occupational History   Occupation: disability  Tobacco Use   Smoking status: Former    Packs/day: 0.00    Types: Cigarettes   Smokeless tobacco: Never   Tobacco comments:    Smoked for 2  years age [age]  Vaping Use   Vaping Use: Never used  Substance and Sexual Activity   Alcohol use: No    Alcohol/week: 1.0 standard drink of alcohol    Types: 1 Standard drinks or equivalent per week    Comment: denies at this time   Drug use: No    Comment: History of cocaine use at age 6 for 4 months   Sexual activity: Not Currently    Birth control/protection: None  Other Topics Concern   Not on file  Social History Narrative   Marital status: Widowed      Children: daughter      Lives: with boyfriend, in two story home      Employment:  Disability      Tobacco: quit smoking; smoked for two years.      Alcohol ;none      Drugs: none   Has not traveled outside of the country.   Right handed         Social Determinants of Health   Financial Resource Strain: Not on file  Food Insecurity: Not on file  Transportation Needs: Not on file  Physical Activity: Not on file  Stress: Not on file  Social Connections: Not on file   Additional Social History:    Allergies:   Allergies  Allergen Reactions   Bee Venom Anaphylaxis   Coconut (Cocos Nucifera) Anaphylaxis and Rash   Coconut Flavor Anaphylaxis and Rash   Fish Allergy Anaphylaxis   Geodon [Ziprasidone Hcl] Other (See Comments)    Pt states that this medication causes paralysis of the mouth.     Haloperidol And Related Other (See Comments)    Pt states that this  medication causes paralysis of the mouth, jaw locks up   Lithobid [Lithium] Other (See Comments)    Seizure-like activity    Roxicodone [Oxycodone] Other (See Comments)    Hallucinations    Seroquel [Quetiapine] Other (See Comments)    Severe drowsiness Pt currently taking 50 mg, she is ok with this strength, she does not want higher dose.   Shellfish Allergy Anaphylaxis   Phenergan [Promethazine Hcl] Other (See Comments)  Chest pain     Prilosec [Omeprazole] Nausea And Vomiting   Sulfa Antibiotics Other (See Comments)    Chest pain    Tegretol [Carbamazepine] Nausea And Vomiting   Prozac [Fluoxetine] Other (See Comments)    Increased Depression Suicidal thoughts   Tape Other (See Comments)    Skin tears, can only tolerate paper tape.   Tylenol [Acetaminophen] Other (See Comments)    Unknown reaction    Labs:  Results for orders placed or performed during the hospital encounter of 10/04/21 (from the past 48 hour(s))  Rapid urine drug screen (hospital performed)     Status: None   Collection Time: 10/04/21  9:46 PM  Result Value Ref Range   Opiates NONE DETECTED NONE DETECTED   Cocaine NONE DETECTED NONE DETECTED   Benzodiazepines NONE DETECTED NONE DETECTED   Amphetamines NONE DETECTED NONE DETECTED   Tetrahydrocannabinol NONE DETECTED NONE DETECTED   Barbiturates NONE DETECTED NONE DETECTED    Comment: (NOTE) DRUG SCREEN FOR MEDICAL PURPOSES ONLY.  IF CONFIRMATION IS NEEDED FOR ANY PURPOSE, NOTIFY LAB WITHIN 5 DAYS.  LOWEST DETECTABLE LIMITS FOR URINE DRUG SCREEN Drug Class                     Cutoff (ng/mL) Amphetamine and metabolites    1000 Barbiturate and metabolites    200 Benzodiazepine                 283 Tricyclics and metabolites     300 Opiates and metabolites        300 Cocaine and metabolites        300 THC                            50 Performed at [facility], [place] [address], [place], [place] [address]   CBC with Differential     Status:  Abnormal   Collection Time: 10/04/21 10:48 PM  Result Value Ref Range   WBC 7.3 4.0 - 10.5 K/uL   RBC 4.19 3.87 - 5.11 MIL/uL   Hemoglobin 10.9 (L) 12.0 - 15.0 g/dL   HCT 33.9 (L) 36.0 - 46.0 %   MCV 80.9 80.0 - 100.0 fL   MCH 26.0 26.0 - 34.0 pg   MCHC 32.2 30.0 - 36.0 g/dL   RDW 13.2 11.5 - 15.5 %   Platelets 124 (L) 150 - 400 K/uL    Comment: REPEATED TO VERIFY   nRBC 0.0 0.0 - 0.2 %   Neutrophils Relative % 63 %   Neutro Abs 4.6 1.7 - 7.7 K/uL   Lymphocytes Relative 31 %   Lymphs Abs 2.3 0.7 - 4.0 K/uL   Monocytes Relative 5 %   Monocytes Absolute 0.3 0.1 - 1.0 K/uL   Eosinophils Relative 0 %   Eosinophils Absolute 0.0 0.0 - 0.5 K/uL   Basophils Relative 1 %   Basophils Absolute 0.0 0.0 - 0.1 K/uL   Immature Granulocytes 0 %   Abs Immature Granulocytes 0.01 0.00 - 0.07 K/uL    Comment: Performed at [facility], [place] [address], [place], [place] [address]  Comprehensive metabolic panel     Status: Abnormal   Collection Time: 10/04/21 10:48 PM  Result Value Ref Range   Sodium 137 135 - 145 mmol/L   Potassium 4.1 3.5 - 5.1 mmol/L   Chloride 107 98 - 111 mmol/L   CO2 26 22 - 32 mmol/L  Glucose, Bld 105 (H) 70 - 99 mg/dL    Comment: Glucose reference range applies only to samples taken after fasting for at least 8 hours.   BUN <5 (L) 6 - 20 mg/dL   Creatinine, Ser 0.78 0.44 - 1.00 mg/dL   Calcium 8.9 8.9 - 10.3 mg/dL   Total Protein 6.2 (L) 6.5 - 8.1 g/dL   Albumin 3.8 3.5 - 5.0 g/dL   AST 22 15 - 41 U/L   ALT 13 0 - 44 U/L   Alkaline Phosphatase 61 38 - 126 U/L   Total Bilirubin 0.6 0.3 - 1.2 mg/dL   GFR, Estimated >60 >60 mL/min    Comment: (NOTE) Calculated using the CKD-EPI Creatinine Equation (2021)    Anion gap 4 (L) 5 - 15    Comment: Performed at [facility], [place] [address], [place], [place] [address]  Acetaminophen level     Status: Abnormal   Collection Time: 10/04/21 10:48 PM  Result Value Ref Range   Acetaminophen (Tylenol), Serum <10 (L)  10 - 30 ug/mL    Comment: (NOTE) Therapeutic concentrations vary significantly. A range of 10-30 ug/mL  may be an effective concentration for many patients. However, some  are best treated at concentrations outside of this range. Acetaminophen concentrations >150 ug/mL at 4 hours after ingestion  and >50 ug/mL at 12 hours after ingestion are often associated with  toxic reactions.  Performed at [facility], [place] [address] [place], [place] [address]   Salicylate level     Status: Abnormal   Collection Time: 10/04/21 10:48 PM  Result Value Ref Range   Salicylate Lvl <9.2 (L) 7.0 - 30.0 mg/dL    Comment: Performed at [place] [address], [place], [place] [address]  Ethanol     Status: None   Collection Time: 10/04/21 10:48 PM  Result Value Ref Range   Alcohol, Ethyl (B) <10 <10 mg/dL    Comment: (NOTE) Lowest detectable limit for serum alcohol is 10 mg/dL.  For medical purposes only. Performed at [facility], [place] [address], [place], [place] [address]   Acetaminophen level     Status: Abnormal   Collection Time: 10/05/21  1:00 AM  Result Value Ref Range   Acetaminophen (Tylenol), Serum <10 (L) 10 - 30 ug/mL    Comment: (NOTE) Therapeutic concentrations vary significantly. A range of 10-30 ug/mL  may be an effective concentration for many patients. However, some  are best treated at concentrations outside of this range. Acetaminophen concentrations >150 ug/mL at 4 hours after ingestion  and >50 ug/mL at 12 hours after ingestion are often associated with  toxic reactions.  Performed at [facility], [place] [address], [place], [place] [address]   Resp Panel by RT-PCR (Flu A&B, Covid) Anterior Nasal Swab     Status: None   Collection Time: 10/05/21  6:14 AM   Specimen: Anterior Nasal Swab  Result Value Ref Range   SARS Coronavirus 2 by RT PCR NEGATIVE NEGATIVE    Comment: (NOTE) SARS-CoV-2 target nucleic acids are NOT DETECTED.  The  SARS-CoV-2 RNA is generally detectable in upper respiratory specimens during the acute phase of infection. The lowest concentration of SARS-CoV-2 viral copies this assay can detect is 138 copies/mL. A negative result does not preclude SARS-Cov-2 infection and should not be used as the sole basis for treatment or other patient management decisions. A negative result may occur with  improper specimen collection/handling,  submission of specimen other than nasopharyngeal swab, presence of viral mutation(s) within the areas targeted by this assay, and inadequate number of viral copies(<138 copies/mL). A negative result must be combined with clinical observations, patient history, and epidemiological information. The expected result is Negative.  Fact Sheet for Patients:  EntrepreneurPulse.com.au  Fact Sheet for Healthcare Providers:  IncredibleEmployment.be  This test is no t yet approved or cleared by the [place] FDA and  has been authorized for detection and/or diagnosis of SARS-CoV-2 by FDA under an Emergency Use Authorization (EUA). This EUA will remain  in effect (meaning this test can be used) for the duration of the COVID-19 declaration under Section 564(b)(1) of the Act, 21 U.S.C.section 360bbb-3(b)(1), unless the authorization is terminated  or revoked sooner.       Influenza A by PCR NEGATIVE NEGATIVE   Influenza B by PCR NEGATIVE NEGATIVE    Comment: (NOTE) The Xpert Xpress SARS-CoV-2/FLU/RSV plus assay is intended as an aid in the diagnosis of influenza from Nasopharyngeal swab specimens and should not be used as a sole basis for treatment. Nasal washings and aspirates are unacceptable for Xpert Xpress SARS-CoV-2/FLU/RSV testing.  Fact Sheet for Patients: EntrepreneurPulse.com.au  Fact Sheet for Healthcare Providers: IncredibleEmployment.be  This test is not yet approved or cleared by the  [place] FDA and has been authorized for detection and/or diagnosis of SARS-CoV-2 by FDA under an Emergency Use Authorization (EUA). This EUA will remain in effect (meaning this test can be used) for the duration of the COVID-19 declaration under Section 564(b)(1) of the Act, 21 U.S.C. section 360bbb-3(b)(1), unless the authorization is terminated or revoked.  Performed at [facility], [place] [address], [place], [place] [address]   I-Stat Beta hCG blood, ED ([facility], [facility], [facility] only)     Status: None   Collection Time: 10/05/21  1:37 PM  Result Value Ref Range   I-stat hCG, quantitative <5.0 <5 mIU/mL   Comment 3            Comment:   GEST. AGE      CONC.  (mIU/mL)   <=1 WEEK        5 - 50     2 WEEKS       50 - 500     3 WEEKS       100 - 10,000     4 WEEKS     1,000 - 30,000        FEMALE AND NON-PREGNANT FEMALE:     LESS THAN 5 mIU/mL     Medications:  Current Facility-Administered Medications  Medication Dose Route Frequency Provider Last Rate Last Admin   LORazepam (ATIVAN) tablet 1 mg  1 mg Oral TID PRN Mallie Darting, NP   1 mg at 10/05/21 1348   QUEtiapine (SEROQUEL) tablet 12.5 mg  12.5 mg Oral Daily Mallie Darting, NP       Current Outpatient Medications  Medication Sig Dispense Refill   albuterol (VENTOLIN HFA) 108 (90 Base) MCG/ACT inhaler Inhale 2 puffs into the lungs every 6 (six) hours as needed for wheezing or shortness of breath.     BIOTIN PO Take 3 capsules by mouth daily.     budesonide-formoterol (SYMBICORT) 160-4.5 MCG/ACT inhaler Inhale 2 puffs into the lungs 2 (two) times daily.     calcium carbonate (TUMS EX) 750 MG chewable tablet Chew 1,500 mg by mouth 2 (two) times daily as needed for heartburn.     cariprazine (VRAYLAR) 3  MG capsule Take 1 capsule (3 mg total) by mouth daily. 30 capsule    diclofenac Sodium (VOLTAREN) 1 % GEL Apply 2 g topically 4 (four) times daily. 100 g 0   escitalopram (LEXAPRO) 10 MG tablet Take 1.5 tablets (15 mg total) by  mouth daily.     escitalopram (LEXAPRO) 5 MG tablet Take 3 tablets (15 mg total) by mouth daily. 30 tablet 0   gabapentin (NEURONTIN) 300 MG capsule Take 1 capsule (300 mg total) by mouth 3 (three) times daily for 7 days. (Patient not taking: Reported on 10/05/2021) 21 capsule 0   ibuprofen (ADVIL) 200 MG tablet Take 600 mg by mouth daily as needed for moderate pain or headache.     Lidocaine-Menthol (ICY HOT MAX LIDOCAINE EX) Apply 1 Application topically daily as needed (pain).     melatonin 3 MG TABS tablet Take 3 mg by mouth at bedtime.     Misc Natural Products (OSTEO BI-FLEX TRIPLE STRENGTH PO) Take 1 capsule by mouth daily.     naproxen sodium (ALEVE) 220 MG tablet Take 220 mg by mouth 2 (two) times daily as needed (back pain).     pantoprazole (PROTONIX) 40 MG tablet TAKE 1 TABLET BY MOUTH DAILY (Patient taking differently: 40 mg daily.) 30 tablet 0   prazosin (MINIPRESS) 1 MG capsule Take 1 capsule (1 mg total) by mouth at bedtime for 7 days. 7 capsule 0   propranolol (INDERAL) 10 MG tablet Take 1 tablet (10 mg total) by mouth 2 (two) times daily.     QUEtiapine (SEROQUEL) 25 MG tablet Take 12.5 mg by mouth daily.      Musculoskeletal: pt moves all extremities and ambulates independently Strength & Muscle Tone: within normal limits Gait & Station: normal Patient leans: N/A   Psychiatric Specialty Exam:  Presentation  General Appearance: Appropriate for Environment; Casual  Eye Contact:Good  Speech:Pressured  Speech Volume:Normal  Handedness:Right   Mood and Affect  Mood:Anxious; Depressed; Dysphoric; Labile  Affect:Congruent; Labile   Thought Process  Thought Processes:Goal Directed  Descriptions of Associations:Circumstantial  Orientation:Full (Time, Place and Person)  Thought Content:Illogical  History of Schizophrenia/Schizoaffective disorder:No  Duration of Psychotic Symptoms:N/A  Hallucinations:Hallucinations: None  Ideas of  Reference:None  Suicidal Thoughts:Suicidal Thoughts: Yes, Active SI Active Intent and/or Plan: With Intent; With Plan; With Means to [place]; With Access to Means  Homicidal Thoughts:Homicidal Thoughts: No   Sensorium  Memory:Immediate Good; Recent Good; Remote Good  Judgment:Impaired  Insight:Poor   Executive Functions  Concentration:Fair  Attention Span:Fair  [place] of Knowledge:Good  Language:Good   Psychomotor Activity  Psychomotor Activity:Psychomotor Activity: Normal   Assets  Assets:Communication Skills; Financial Resources/Insurance; Housing; Social Support   Sleep  Sleep:Sleep: Fair Number of Hours of Sleep: 7    Physical Exam: Physical Exam Constitutional:      Appearance: Normal appearance.  Cardiovascular:     Rate and Rhythm: Normal rate.     Pulses: Normal pulses.  Pulmonary:     Effort: Pulmonary effort is normal.  Musculoskeletal:     Cervical back: Normal range of motion.  Neurological:     Mental Status: She is alert and oriented to person, place, and time. Mental status is at baseline.  Psychiatric:        Attention and Perception: Attention and perception normal.        Mood and Affect: Mood is anxious and depressed. Affect is labile.        Speech: Speech is rapid and pressured.  Behavior: Behavior is cooperative.        Thought Content: Thought content is not paranoid or delusional. Thought content includes suicidal ideation. Thought content does not include homicidal ideation. Thought content includes suicidal plan. Thought content does not include homicidal plan.        Cognition and Memory: Cognition and memory normal.        Judgment: Judgment is impulsive and inappropriate.    Review of Systems  Constitutional: Negative.   HENT: Negative.    Eyes: Negative.   Respiratory: Negative.    Cardiovascular: Negative.   Gastrointestinal: Negative.   Genitourinary: Negative.   Musculoskeletal: Negative.    Skin: Negative.   Neurological: Negative.   Endo/Heme/Allergies: Negative.   Psychiatric/Behavioral:  Positive for depression and suicidal ideas. The patient is nervous/anxious.    Blood pressure 122/70, pulse 74, temperature 98.1 F (36.7 C), temperature source Oral, resp. rate 18, height '5\' 4"'$  (1.626 m), weight 92.6 kg, SpO2 96 %. Body mass index is 35.04 kg/m.  Treatment Plan Summary: Patient with history of prior suicide attempts, presents to the ED after she attempted suicide via overdose on prescribed medications. Pt was triggered by psychosocial stressors.  She continues to endorse suicidal ideations, has impaired judgement and limited coping skills.  Considering this, pt would benefit from inpatient hospitalization where she can be monitored for safety and have her medications adjusted to promote mood stability.  Pt has already been medically cleared but in lieu of her overdose she has not been restarted on all of her psychiatric medications.  Her EKG shows inferior infarct, age undetermined which is consistent with previous EKG completed 07/31/2021.  No prolonged QT intervals seen today.  Pt has already been medically cleared.   Daily contact with patient to assess and evaluate symptoms and progress in treatment and Medication management  Medications:  Ativan '1mg'$  po TID prn anxiety Seroquel 12.'5mg'$  po qhs for sleep  Disposition:  Patient is recommended for inpatient psychiatric admission.  Per [facility], d/t limited staffing concerns, they cannot accept patient today. SW has already faxed pt out to external facilities.   This service was provided via telemedicine using a 2-way, interactive audio and video technology.  Names of all persons participating in this telemedicine service and their role in this encounter. Name: [name] Role: Patient  Name: Merlyn Lot Role: PMHNP    Mallie Darting, NP 10/05/2021 8:02 PM

## 2021-10-05 NOTE — ED Notes (Signed)
Covid swab obtained and HCG obtained before removing IV

## 2021-10-05 NOTE — ED Notes (Signed)
Patient is requesting all of her home meds.  MD Countryman made aware via secure chat.

## 2021-10-05 NOTE — ED Notes (Signed)
Patient noted to be scratching her arm.  Patient immediately stopped, and asked why she was doing that.  Patient stated that it "made her feel better."  Patient given better coping mechanisms and agreed to use those instead of scratching her arm.  Small abrasion noted to arm.

## 2021-10-05 NOTE — ED Notes (Signed)
TTS in progress 

## 2021-10-05 NOTE — ED Notes (Signed)
Pt came up to nurse's station and stated "I just wanted to let you know that I'm hearing voices and having suicidal thoughts." Reassured pt she was in a safe space.

## 2021-10-05 NOTE — ED Notes (Signed)
Pt states when she urinates she does not feel like she is urinating much but denies burning with urination or blood in urine. Pt also c/o ear pain. Pt also, asking for something for her anxiety and voices. EDP and psych NP notified

## 2021-10-05 NOTE — ED Notes (Signed)
IVC paperwork for pt is complete and on clipboard in orange.

## 2021-10-05 NOTE — ED Notes (Signed)
Pt has been accepted to [facility] and will go over in AM. Report to be called to  [phone] in AM. IVC paperwork faxed to [phone]  per request

## 2021-10-05 NOTE — Progress Notes (Addendum)
Pt is under review per [facility]   Per Ripley Lynnda Shields, RN PENDING discharges and staffing. CSW will assist with placement and follow up with [facility].Pt meets inpatient criteria per Merlyn Lot, NP.  Benjaman Kindler, MSW, [facility] 10/05/2021 12:32 PM

## 2021-10-05 NOTE — ED Notes (Signed)
Pt had shower this shift

## 2021-10-05 NOTE — ED Provider Notes (Signed)
Patient stable overnight. She is eating and drinking Vitals are appropriate Discussed with poison center. At this point patient is medically stable  EKG Interpretation  Date/Time:  Thursday October 05 2021 04:52:48 EDT Ventricular Rate:  77 PR Interval:  156 QRS Duration: 86 QT Interval:  416 QTC Calculation: 470 R Axis:   32 Text Interpretation: Normal sinus rhythm Inferior infarct , age undetermined Abnormal ECG No significant change since last tracing Confirmed by Ripley Fraise [phone]) on 10/05/2021 5:02:52 AM          Ripley Fraise, MD 10/05/21 [phone]

## 2021-10-05 NOTE — ED Notes (Deleted)
Attempted again to call report to [facility] with no success

## 2021-10-05 NOTE — ED Provider Notes (Addendum)
Emergency Medicine Observation Re-evaluation Note  [name] is a 31 y.o. female, seen on rounds today.  Pt initially presented to the ED for complaints of Ingestion Currently, the patient is medically cleared from overdose and awaiting psychiatric evaluation.  Physical Exam  BP (!) 118/93 (BP Location: Left Arm)   Pulse (!) 120   Temp 97.7 F (36.5 C) (Oral)   Resp 18   Ht 1.626 m ('5\' 4"'$ )   Wt 92.6 kg   SpO2 97%   BMI 35.04 kg/m  Physical Exam General: wdwn Cardiac: rrr Lungs: no distress Psych: resting  ED Course / MDM  EKG:EKG Interpretation  Date/Time:  Thursday October 05 2021 04:52:48 EDT Ventricular Rate:  77 PR Interval:  156 QRS Duration: 86 QT Interval:  416 QTC Calculation: 470 R Axis:   32 Text Interpretation: Normal sinus rhythm Inferior infarct , age undetermined Abnormal ECG No significant change since last tracing Confirmed by Ripley Fraise [phone]) on 10/05/2021 5:02:52 AM  I have reviewed the labs performed to date as well as medications administered while in observation.  Recent changes in the last 24 hours include medical clearance for overdose.  Plan  Current plan is for awaiting psych evaluation for placement. Pregnancy test ordered-pregnancy test is negative patient is cleared for psych Psychiatric consult commend psychiatric inpatient admission    Pattricia Boss, MD 10/05/21 2800    Pattricia Boss, MD 10/05/21 1535

## 2021-10-05 NOTE — ED Provider Notes (Signed)
I assumed care at signout.  Plan is to monitor overnight per poison control recommendations.  Patient is resting comfortably and easily arousable.  Blood pressure has been ranging from the upper 80s to low 100s.  Labs have been overall unremarkable  Continue to monitor.   Ripley Fraise, MD 10/05/21 [phone]

## 2021-10-06 ENCOUNTER — Inpatient Hospital Stay (HOSPITAL_COMMUNITY)
Admission: AD | Admit: 2021-10-06 | Discharge: 2021-10-10 | DRG: 885 | Disposition: A | Discharge status: Home / Self Care | Payer: PPO | Source: Intra-hospital | Attending: Emergency Medicine | Admitting: Emergency Medicine

## 2021-10-06 ENCOUNTER — Encounter (HOSPITAL_COMMUNITY): Payer: Self-pay | Admitting: Emergency Medicine

## 2021-10-06 ENCOUNTER — Other Ambulatory Visit: Payer: Self-pay

## 2021-10-06 DIAGNOSIS — Z9102 Food additives allergy status: Secondary | ICD-10-CM

## 2021-10-06 DIAGNOSIS — Z79899 Other long term (current) drug therapy: Secondary | ICD-10-CM

## 2021-10-06 DIAGNOSIS — F845 Asperger's syndrome: Secondary | ICD-10-CM | POA: Diagnosis not present

## 2021-10-06 DIAGNOSIS — J45909 Unspecified asthma, uncomplicated: Secondary | ICD-10-CM | POA: Diagnosis present

## 2021-10-06 DIAGNOSIS — F250 Schizoaffective disorder, bipolar type: Principal | ICD-10-CM | POA: Diagnosis present

## 2021-10-06 DIAGNOSIS — K219 Gastro-esophageal reflux disease without esophagitis: Secondary | ICD-10-CM | POA: Diagnosis not present

## 2021-10-06 DIAGNOSIS — F603 Borderline personality disorder: Secondary | ICD-10-CM | POA: Diagnosis present

## 2021-10-06 DIAGNOSIS — Z91030 Bee allergy status: Secondary | ICD-10-CM

## 2021-10-06 DIAGNOSIS — Z148 Genetic carrier of other disease: Secondary | ICD-10-CM

## 2021-10-06 DIAGNOSIS — Z7951 Long term (current) use of inhaled steroids: Secondary | ICD-10-CM

## 2021-10-06 DIAGNOSIS — D649 Anemia, unspecified: Secondary | ICD-10-CM | POA: Diagnosis not present

## 2021-10-06 DIAGNOSIS — Z9151 Personal history of suicidal behavior: Secondary | ICD-10-CM | POA: Diagnosis not present

## 2021-10-06 DIAGNOSIS — Z818 Family history of other mental and behavioral disorders: Secondary | ICD-10-CM | POA: Diagnosis not present

## 2021-10-06 DIAGNOSIS — Z62810 Personal history of physical and sexual abuse in childhood: Secondary | ICD-10-CM | POA: Diagnosis present

## 2021-10-06 DIAGNOSIS — M797 Fibromyalgia: Secondary | ICD-10-CM | POA: Diagnosis not present

## 2021-10-06 DIAGNOSIS — F4310 Post-traumatic stress disorder, unspecified: Secondary | ICD-10-CM | POA: Diagnosis not present

## 2021-10-06 DIAGNOSIS — Z91410 Personal history of adult physical and sexual abuse: Secondary | ICD-10-CM

## 2021-10-06 DIAGNOSIS — T50992A Poisoning by other drugs, medicaments and biological substances, intentional self-harm, initial encounter: Secondary | ICD-10-CM | POA: Diagnosis not present

## 2021-10-06 DIAGNOSIS — T1491XA Suicide attempt, initial encounter: Secondary | ICD-10-CM | POA: Diagnosis present

## 2021-10-06 DIAGNOSIS — F411 Generalized anxiety disorder: Secondary | ICD-10-CM | POA: Diagnosis present

## 2021-10-06 DIAGNOSIS — Z886 Allergy status to analgesic agent status: Secondary | ICD-10-CM

## 2021-10-06 DIAGNOSIS — Z888 Allergy status to other drugs, medicaments and biological substances status: Secondary | ICD-10-CM

## 2021-10-06 DIAGNOSIS — Z885 Allergy status to narcotic agent status: Secondary | ICD-10-CM

## 2021-10-06 DIAGNOSIS — G4730 Sleep apnea, unspecified: Secondary | ICD-10-CM | POA: Diagnosis present

## 2021-10-06 DIAGNOSIS — Z825 Family history of asthma and other chronic lower respiratory diseases: Secondary | ICD-10-CM

## 2021-10-06 DIAGNOSIS — F3160 Bipolar disorder, current episode mixed, unspecified: Secondary | ICD-10-CM | POA: Diagnosis present

## 2021-10-06 DIAGNOSIS — Z8616 Personal history of COVID-19: Secondary | ICD-10-CM | POA: Diagnosis not present

## 2021-10-06 DIAGNOSIS — Z87891 Personal history of nicotine dependence: Secondary | ICD-10-CM | POA: Diagnosis not present

## 2021-10-06 DIAGNOSIS — Z91048 Other nonmedicinal substance allergy status: Secondary | ICD-10-CM

## 2021-10-06 DIAGNOSIS — T50902A Poisoning by unspecified drugs, medicaments and biological substances, intentional self-harm, initial encounter: Secondary | ICD-10-CM | POA: Diagnosis present

## 2021-10-06 DIAGNOSIS — D696 Thrombocytopenia, unspecified: Secondary | ICD-10-CM | POA: Diagnosis present

## 2021-10-06 DIAGNOSIS — Z91013 Allergy to seafood: Secondary | ICD-10-CM | POA: Diagnosis not present

## 2021-10-06 DIAGNOSIS — Z882 Allergy status to sulfonamides status: Secondary | ICD-10-CM

## 2021-10-06 DIAGNOSIS — E781 Pure hyperglyceridemia: Secondary | ICD-10-CM | POA: Diagnosis present

## 2021-10-06 MED ORDER — ALBUTEROL SULFATE HFA 108 (90 BASE) MCG/ACT IN AERS
2.0000 | INHALATION_SPRAY | Freq: Four times a day (QID) | RESPIRATORY_TRACT | Status: DC | PRN
  Administered 2021-10-08: 2 via RESPIRATORY_TRACT
  Filled 2021-10-06: qty 6.7

## 2021-10-06 MED ORDER — IBUPROFEN 200 MG PO TABS
200.0000 mg | ORAL_TABLET | Freq: Every day | ORAL | Status: DC | PRN
Start: 2021-10-06 — End: 2021-10-10
  Administered 2021-10-06 – 2021-10-09 (×4): 200 mg via ORAL
  Filled 2021-10-06 (×4): qty 1

## 2021-10-06 MED ORDER — HYDROXYZINE PAMOATE 25 MG PO CAPS: 25.0000 mg | ORAL_CAPSULE | Freq: Four times a day (QID) | ORAL | Status: DC | PRN

## 2021-10-06 MED ORDER — DICLOFENAC SODIUM 1 % EX GEL
2.0000 g | Freq: Four times a day (QID) | CUTANEOUS | Status: DC | PRN
  Administered 2021-10-07 – 2021-10-09 (×6): 2 g via TOPICAL
  Filled 2021-10-06: qty 100

## 2021-10-06 MED ORDER — MAGNESIUM HYDROXIDE 400 MG/5ML PO SUSP
30.0000 mL | Freq: Every day | ORAL | Status: DC | PRN
  Administered 2021-10-07: 30 mL via ORAL
  Filled 2021-10-06: qty 30

## 2021-10-06 MED ORDER — QUETIAPINE 12.5 MG HALF TABLET
12.5000 mg | ORAL_TABLET | Freq: Every day | ORAL | Status: DC
  Administered 2021-10-06: 12.5 mg via ORAL
  Filled 2021-10-06 (×3): qty 1

## 2021-10-06 MED ORDER — HYDROXYZINE HCL 25 MG PO TABS
25.0000 mg | ORAL_TABLET | Freq: Four times a day (QID) | ORAL | Status: DC | PRN
  Administered 2021-10-06 – 2021-10-08 (×4): 25 mg via ORAL
  Filled 2021-10-06 (×5): qty 1

## 2021-10-06 MED ORDER — CYCLOBENZAPRINE HCL 10 MG PO TABS
10.0000 mg | ORAL_TABLET | Freq: Every day | ORAL | Status: DC
  Administered 2021-10-06 – 2021-10-09 (×4): 10 mg via ORAL
  Filled 2021-10-06 (×7): qty 1

## 2021-10-06 MED ORDER — ALUM & MAG HYDROXIDE-SIMETH 200-200-20 MG/5ML PO SUSP
30.0000 mL | ORAL | Status: DC | PRN
  Administered 2021-10-07: 30 mL via ORAL
  Filled 2021-10-06: qty 30

## 2021-10-06 NOTE — Progress Notes (Signed)
Patient admitted to 300 hall today from [place] following a suicide attempt 2 days ago. Pt states she lives with her ex with whom she has an ongoing conflict. Pt states she was arguing with ex and ingested a weeks worth of all of her prescription medication. Pt reports a history of anxiety, depression, bipolar, borderline personality disorder, schizoaffective disorder, fibromyalgia, tachycardia, GERD, asthma, and arthritis. Pt reports due to asthma she opts not to participate in animal therapy. Pt endorses chronic pain related to fibromyalgia 8/10. Pt endorses current suicidal thoughts. Pt does contract for safety. Pt endorses auditory hallucinations commanding her to kill herself. Pt states she has a history of visual hallucinations and paranoia. Pt presents with anxious mood and congruent affect. Speech is rapid and hyperverbal. Pt has a history of self injurious behavior including cutting scratching and burning self. Scars on forearms bilaterally from self harm. Left forearm noted to have 2 areas of ecchymosis the size of ping pong balls due to IV insertion in hospital.

## 2021-10-06 NOTE — ED Notes (Signed)
[facility] arrived to transport pt to [facility]. All belongings given to [facility]. Pt calm and cooperative at time of transport.

## 2021-10-06 NOTE — Progress Notes (Addendum)
Pt was accepted to [facility] 91/23; Bed Assignment 302-2  Pt meets inpatient criteria per Merlyn Lot, NP  Attending Physician will be Dr. [name]   Report can be called to: - Child and Adolescence unit: [phone] -Adult unit: [phone]  Pt can arrive after: [facility] will coordinate  Please fax IVC: [phone]. Tempie Hoist, RN sent fax.  Care Team notified: [facility] Lynnda Shields, RN, [facility] Night AC Wynonia Hazard, RN, Merlyn Lot, NP, and Tempie Hoist, RN.   Nadara Mode, [place] 10/06/2021 @ 12:40 AM

## 2021-10-06 NOTE — Progress Notes (Signed)
Adult Psychoeducational Group Note  Date:  10/06/2021 Time:  8:45 PM  Group Topic/Focus:  Wrap-Up Group:   The focus of this group is to help patients review their daily goal of treatment and discuss progress on daily workbooks.  Participation Level:  Active  Participation Quality:  Appropriate  Affect:  Appropriate  Cognitive:  Appropriate  Insight: Appropriate  Engagement in Group:  Improving  Modes of Intervention:  Discussion  Additional Comments:  Pt attend AA group.  Candy Sledge 10/06/2021, 8:45 PM

## 2021-10-06 NOTE — Group Note (Signed)
LCSW Group Therapy Note  10/06/2021    1:00 - 2:00 PM               Type of Therapy and Topic:  Group Therapy: Understanding the differences between fear and anxiety / Fear Ladder & Examining the Evidence  Participation Level:  Active   Description of Group:   In this group session, patients learned how to define and recognize the similarities differences between fear and anxiety. Patients will explore reactions we have to fear and anxiety. Patients identified a fear or something they feel anxious about. Patients analyzed scenarios and discussed both the positive and negative aspects to them. Patients were asked to identify if it is a fear or anxiety as well.  Patients were asked to provide their own examples. Patients will learn what to do for both fear and anxiety. CSW provided tools such as breaking things up into smaller steps, challenging automatic negative thinking / questions to ask, fear ladder and how to use gradual exposure. Patients will be asked to practice each tool with situations and to complete a fear ladder for their personal gain.   Therapeutic Goals: Patients will learn the difference between fear versus anxiety and the definitions of both. Patients will utilize scenarios and be able to identify if this is an example of a fear or anxiety. Patients will learn tools to handle both their fears and anxieties as well as discuss breaking it into smaller steps and exposure.  Patients will be asked to provide examples of their own and discuss how things have both positive and negative aspects.  Patients were provided questions to ask to challenge automatic negative thinking for anxiety.  Patients were provided a sample form of a fear ladder and asked to complete one for a fear.   Summary of Patient Progress:  Patient was engaged and participated in the group session. Patient shared their understanding of fear and anxiety   . Patient shared one fear and one anxiety. Patient reports their  plan to help with fear/anxiety to be .   Therapeutic Modalities:   Cognitive Behavioral Therapy Motivational Interviewing  Brief Therapy  Windle Guard, [place]  10/06/2021 2:57 PM

## 2021-10-06 NOTE — ED Notes (Signed)
Pt having 1st phone call of the day

## 2021-10-06 NOTE — ED Notes (Signed)
Per staffing, a sitter will be on their way around 1200

## 2021-10-06 NOTE — ED Notes (Signed)
Belongings located in locker #4: purse w/ wallet, keys and cell phone; Book bag w/ extra clothing, and a belongings bag w/ shirt, shorts, shoes. No items w/ security or pharamcy.

## 2021-10-07 DIAGNOSIS — M797 Fibromyalgia: Secondary | ICD-10-CM | POA: Diagnosis present

## 2021-10-07 LAB — LIPID PANEL
Cholesterol: 148 mg/dL (ref 0–200)
HDL: 39 mg/dL — ABNORMAL LOW (ref >40)
LDL Cholesterol: 74 mg/dL (ref 0–99)
Total CHOL/HDL Ratio: 3.8 RATIO
Triglycerides: 173 mg/dL — ABNORMAL HIGH (ref <150)
VLDL: 35 mg/dL (ref 0–40)

## 2021-10-07 LAB — IRON AND TIBC
Iron: 50 ug/dL (ref 28–170)
Saturation Ratios: 14 % (ref 10.4–31.8)
TIBC: 361 ug/dL (ref 250–450)
UIBC: 311 ug/dL

## 2021-10-07 LAB — RETICULOCYTES
Immature Retic Fract: 17.1 % — ABNORMAL HIGH (ref 2.3–15.9)
RBC.: 4.07 MIL/uL (ref 3.87–5.11)
Retic Count, Absolute: 70 10*3/uL (ref 19.0–186.0)
Retic Ct Pct: 1.7 % (ref 0.4–3.1)

## 2021-10-07 LAB — HEMOGLOBIN A1C
Hgb A1c MFr Bld: 5 % (ref 4.8–5.6)
Mean Plasma Glucose: 96.8 mg/dL

## 2021-10-07 LAB — TSH: TSH: 1.511 u[IU]/mL (ref 0.350–4.500)

## 2021-10-07 LAB — FERRITIN: Ferritin: 8 ng/mL — ABNORMAL LOW (ref 11–307)

## 2021-10-07 MED ORDER — ESCITALOPRAM OXALATE 10 MG PO TABS
10.0000 mg | ORAL_TABLET | Freq: Every day | ORAL | Status: DC
  Administered 2021-10-07 – 2021-10-08 (×2): 10 mg via ORAL
  Filled 2021-10-07 (×5): qty 1

## 2021-10-07 MED ORDER — PANTOPRAZOLE SODIUM 40 MG PO TBEC
40.0000 mg | DELAYED_RELEASE_TABLET | Freq: Every day | ORAL | Status: DC
  Administered 2021-10-07 – 2021-10-10 (×4): 40 mg via ORAL
  Filled 2021-10-07 (×7): qty 1

## 2021-10-07 MED ORDER — GABAPENTIN 300 MG PO CAPS
300.0000 mg | ORAL_CAPSULE | Freq: Three times a day (TID) | ORAL | Status: DC
  Administered 2021-10-07 – 2021-10-10 (×9): 300 mg via ORAL
  Filled 2021-10-07 (×14): qty 1

## 2021-10-07 MED ORDER — PRAZOSIN HCL 1 MG PO CAPS
1.0000 mg | ORAL_CAPSULE | Freq: Every day | ORAL | Status: DC
  Administered 2021-10-07 – 2021-10-09 (×3): 1 mg via ORAL
  Filled 2021-10-07 (×6): qty 1

## 2021-10-07 MED ORDER — QUETIAPINE 12.5 MG HALF TABLET
12.5000 mg | ORAL_TABLET | Freq: Every morning | ORAL | Status: DC
  Filled 2021-10-07 (×2): qty 1

## 2021-10-07 MED ORDER — CARIPRAZINE HCL 1.5 MG PO CAPS
1.5000 mg | ORAL_CAPSULE | Freq: Every day | ORAL | Status: DC
  Administered 2021-10-08: 1.5 mg via ORAL
  Filled 2021-10-07 (×4): qty 1

## 2021-10-07 MED ORDER — QUETIAPINE FUMARATE 25 MG PO TABS
25.0000 mg | ORAL_TABLET | Freq: Every day | ORAL | Status: DC
  Administered 2021-10-07 – 2021-10-09 (×3): 25 mg via ORAL
  Filled 2021-10-07 (×6): qty 1

## 2021-10-07 MED ORDER — LIDOCAINE 5 % EX PTCH
1.0000 | MEDICATED_PATCH | CUTANEOUS | Status: DC
  Administered 2021-10-07 – 2021-10-09 (×3): 1 via TRANSDERMAL
  Filled 2021-10-07 (×5): qty 1

## 2021-10-07 MED ORDER — MOMETASONE FURO-FORMOTEROL FUM 100-5 MCG/ACT IN AERO
2.0000 | INHALATION_SPRAY | Freq: Two times a day (BID) | RESPIRATORY_TRACT | Status: DC
  Administered 2021-10-08 – 2021-10-10 (×5): 2 via RESPIRATORY_TRACT
  Filled 2021-10-07 (×2): qty 8.8

## 2021-10-07 MED ORDER — PROPRANOLOL HCL 10 MG PO TABS
10.0000 mg | ORAL_TABLET | Freq: Two times a day (BID) | ORAL | Status: DC
  Administered 2021-10-07 – 2021-10-10 (×6): 10 mg via ORAL
  Filled 2021-10-07 (×10): qty 1

## 2021-10-07 NOTE — Progress Notes (Signed)
Patient denies SI, HI, and AVH this shift. Patient has had multiple somatic complaints this shift and required much nursing support. Patient had no incidents of behavioral dyscontrol this shift.   Assess patient for safety, offer medications as prescribed, engage patient in 1:1 staff talks.   Patient able to contract for safety. Continue to monitor as planned.

## 2021-10-07 NOTE — BHH Group Notes (Signed)
.  Psychoeducational Group Note    Date:  10/07/2021 Time: 1300-1400    Purpose of Group: . The group focus' on teaching patients on how to identify their needs and their Life Skills:  A group where two lists are made. What people need and what are things that we do that are unhealthy. The lists are developed by the patients and it is explained that we often do the actions that are not healthy to get our list of needs met.  Goal:: to develop the coping skills needed to get their needs met  Participation Level:  Active  Participation Quality:  Appropriate  Affect:  Appropriate  Cognitive:  Oriented  Insight: Improving  Engagement in Group:  Engaged  Modes of Intervention:  Activity, Discussion, Education, and Support  Additional Comments:  Rates her energy at a 10/10. Participated fully in the group.  [name]
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
## 2021-10-07 NOTE — BHH Suicide Risk Assessment (Addendum)
Suicide Risk Assessment  Admission Assessment    [facility] Admission Suicide Risk Assessment   Nursing information obtained from:    Demographic factors:  Caucasian Current Mental Status:  Suicidal ideation indicated by patient Loss Factors:  Legal issues, conflict with roommate Historical Factors:  Family history of suicide, Family history of mental illness or substance abuse, previous suicide attempts and inpatient admissions Risk Reduction Factors:  Living with another person, especially a relative, Employed  Total Time spent with patient: 60 minutes  Principal Problem: Schizoaffective disorder, bipolar type ([place]) Diagnosis:  Principal Problem:   Schizoaffective disorder, bipolar type ([place]) Active Problems:   Borderline personality disorder ([place])   Asperger syndrome   PTSD (post-traumatic stress disorder)   GAD (generalized anxiety disorder)   Suicide attempt ([place])   Fibromyalgia  Subjective Data:  [name] is a 31 year old female with a past psychiatric history of schizoaffective disorder bipolar type, anxiety, Asperger's, PTSD, and borderline personality disorder, and a medical history of fibromyalgia, tachycardia, and GERD who presented under IVC after a suicide attempt in which she took a week's worth of all of her prescribed medications.   On Chart Review: Patient has had multiple inpatient psychiatric admissions, with the last June 2023 at [facility] for a suicide attempt in which she overdosed on a 7-day (or 14) supply of all of her medications and multiple visits to the behavioral health urgent care.    Continued Clinical Symptoms:    The "Alcohol Use Disorders Identification Test", Guidelines for Use in Primary Care, Second Edition.  World Pharmacologist [facility]). Score between 0-7:  no or low risk or alcohol related problems. Score between 8-15:  moderate risk of alcohol related problems. Score between 16-19:  high risk of alcohol related problems. Score 20 or above:   warrants further diagnostic evaluation for alcohol dependence and treatment.   CLINICAL FACTORS:   Bipolar Disorder:   Mixed State Depression:   Impulsivity Schizophrenia:   Command hallucinatons Less than 56 years old Personality Disorders:   Cluster B More than one psychiatric diagnosis Currently Psychotic Previous Psychiatric Diagnoses and Treatments Medical Diagnoses and Treatments/Surgeries   Musculoskeletal: Strength & Muscle Tone: within normal limits Gait & Station: normal Patient leans: N/A  Psychiatric Specialty Exam:  Presentation  General Appearance: Appropriate for Environment; Casual  Eye Contact:Good  Speech:Clear and Coherent; Normal Rate  Speech Volume:Normal  Handedness:Right   Mood and Affect  Mood:Anxious ("Feeling good, ready to go home")  Affect:Congruent; Appropriate   Thought Process  Thought Processes:Goal Directed  Descriptions of Associations:Intact  Orientation:Full (Time, Place and Person)  Thought Content:Logical; Rumination (Ruminating on being ready for discharge and getting back to work)  History of Schizophrenia/Schizoaffective disorder:No  Duration of Psychotic Symptoms:N/A  Hallucinations:Hallucinations: None  Ideas of Reference:None  Suicidal Thoughts:Suicidal Thoughts: No  Homicidal Thoughts:Homicidal Thoughts: No   Sensorium  Memory:Immediate Good; Recent Good  Judgment:Fair  Insight:Fair   Executive Functions  Concentration:Good  Attention Span:Good  [place] of Knowledge:Good  Language:Good   Psychomotor Activity  Psychomotor Activity:Psychomotor Activity: Normal   Assets  Assets:Communication Skills; Desire for Improvement; Financial Resources/Insurance; Housing; Intimacy; Social Support   Sleep  Sleep:Sleep: Good    Physical Exam: Vitals reviewed.  Constitutional:      General: She is not in acute distress.    Appearance: She is not toxic-appearing.  HENT:     Head:  Normocephalic and atraumatic.     Mouth/Throat:     Mouth: Mucous membranes are moist.  Pharynx: Oropharynx is clear.  Pulmonary:     Effort: Pulmonary effort is normal.  Skin:    General: Skin is warm and dry.     Findings: Bruising present.     Comments: 2 large bruises to left arm, which patient reports is from IVs.  Well-healed scar is apparent on both forearms  Neurological:     General: No focal deficit present.     Mental Status: She is alert and oriented to person, place, and time.     Motor: No weakness.     Gait: Gait normal.      Review of Systems  Constitutional:  Negative for malaise/fatigue.  Respiratory:  Negative for shortness of breath.   Cardiovascular:  Negative for chest pain and palpitations.  Gastrointestinal:  Positive for constipation and heartburn. Negative for abdominal pain, diarrhea, nausea and vomiting.  Genitourinary: Negative.   Musculoskeletal:  Positive for joint pain.  Neurological:  Negative for dizziness, tremors and headaches.  Blood pressure 109/88, pulse 87, temperature 97.9 F (36.6 C), temperature source Oral, resp. rate 16, height '5\' 5"'$  (1.651 m), weight 89.8 kg, SpO2 100 %. Body mass index is 32.95 kg/m.   COGNITIVE FEATURES THAT CONTRIBUTE TO RISK:  Polarized thinking    SUICIDE RISK:   Severe:  Frequent, intense, and enduring suicidal ideation, specific plan, no subjective intent, but some objective markers of intent (i.e., choice of lethal method), the method is accessible, some limited preparatory behavior, evidence of impaired self-control, severe dysphoria/symptomatology, multiple risk factors present, and few if any protective factors, particularly a lack of social support.  PLAN OF CARE:   See H&P for full plan of care  I certify that inpatient services furnished can reasonably be expected to improve the patient's condition.   Rosezetta Schlatter, MD 10/07/2021, 5:06 PM

## 2021-10-07 NOTE — BHH Group Notes (Signed)
Goals Group 10/07/2021   Group Focus: affirmation, clarity of thought, and goals/reality orientation Treatment Modality:  Psychoeducation Interventions utilized were assignment, group exercise, and support Purpose: To be able to understand and verbalize the reason for their admission to the hospital. To understand that the medication helps with their chemical imbalance but they also need to work on their choices in life. To be challenged to develop a list of 30 positives about themselves. Also introduce the concept that "feelings" are not reality.  Participation Level: did not attend group. [name]

## 2021-10-07 NOTE — Progress Notes (Signed)
   10/07/21 2158  Psych Admission Type (Psych Patients Only)  Admission Status Voluntary  Psychosocial Assessment  Patient Complaints Anxiety  Eye Contact Fair  Facial Expression Animated  Affect Appropriate to circumstance  Speech Logical/coherent  Interaction Childlike;Needy;Intrusive  Motor Activity Fidgety;Restless  Appearance/Hygiene Unremarkable  Behavior Characteristics Appropriate to situation  Mood Anxious;Pleasant  Thought Process  Coherency WDL  Content WDL  Delusions None reported or observed  Perception WDL  Hallucination None reported or observed  Judgment Poor  Confusion None  Danger to Self  Current suicidal ideation? Denies  Danger to Others  Danger to Others None reported or observed
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
## 2021-10-07 NOTE — Group Note (Signed)
LCSW Group Therapy Note  10/07/2021   10:00-11:00am   Type of Therapy and Topic:  Group Therapy: Anger Cues and Responses  Participation Level:  Did Not Attend   Description of Group:   In this group, patients learned how to recognize the physical, cognitive, emotional, and behavioral responses they have to anger-provoking situations.  They identified a recent time they became angry and how they reacted.  They analyzed how their reaction was possibly beneficial and how it was possibly unhelpful.  The group discussed a variety of healthier coping skills that could help with such a situation in the future.  They also learned that anger is a second emotion fueled by other feelings and explored their own emotions that may frequently fuel their anger.  Focus was placed on how helpful it is to recognize the underlying emotions to our anger, because working on those can lead to a more permanent solution as well as our ability to focus on the important rather than the urgent.  Therapeutic Goals: Patients will remember their last incident of anger and how they felt emotionally and physically, what their thoughts were at the time, and how they behaved. Patients will identify how their behavior at that time worked for them, as well as how it worked against them. Patients will explore possible new behaviors to use in future anger situations. Patients will learn that anger itself is normal and cannot be eliminated, and that healthier reactions can assist with resolving conflict rather than worsening situations. Patients will learn that anger is a secondary emotion and worked to identify some of the underlying feelings that may lead to anger.  Summary of Patient Progress:  Patient was invited to group, did not attend.   Therapeutic Modalities:   Cognitive Behavioral Therapy  Maretta Los

## 2021-10-07 NOTE — Progress Notes (Signed)
[place] Group Notes:  (Nursing/MHT/Case Management/Adjunct)  Date:  10/07/2021  Time:  2000  Type of Therapy:   wrap up group  Participation Level:  Active  Participation Quality:  Appropriate, Attentive, Sharing, and Supportive  Affect:  Irritable  Cognitive:  Alert  Insight:  Lacking  Engagement in Group:  Engaged  Modes of Intervention:  Clarification, Education, and Support  Summary of Progress/Problems: Positive thinking and positive change were discussed.   Shellia Cleverly 10/07/2021, 8:42 PM

## 2021-10-07 NOTE — [facility] Suicide Risk Assessment (Signed)
[facility] INPATIENT:  Family/Significant Other Suicide Prevention Education  Suicide Prevention Education:  Patient Refusal for Family/Significant Other Suicide Prevention Education: The patient [name] has refused to provide written consent for family/significant other to be provided Family/Significant Other Suicide Prevention Education during admission and/or prior to discharge.  Physician notified.  Berlin Hun Grossman-Orr 10/07/2021, 3:36 PM

## 2021-10-08 LAB — RESP PANEL BY RT-PCR (FLU A&B, COVID) ARPGX2
Influenza A by PCR: NEGATIVE
Influenza B by PCR: NEGATIVE
SARS Coronavirus 2 by RT PCR: NEGATIVE

## 2021-10-08 MED ORDER — HYDROXYZINE HCL 50 MG PO TABS: 50.0000 mg | ORAL_TABLET | Freq: Three times a day (TID) | ORAL | Status: DC | PRN

## 2021-10-08 MED ORDER — DM-GUAIFENESIN ER 30-600 MG PO TB12
1.0000 | ORAL_TABLET | Freq: Two times a day (BID) | ORAL | Status: DC
  Administered 2021-10-08 – 2021-10-09 (×2): 1 via ORAL
  Filled 2021-10-08 (×6): qty 1

## 2021-10-08 MED ORDER — MENTHOL 3 MG MT LOZG
1.0000 | LOZENGE | OROMUCOSAL | Status: DC | PRN
Start: 2021-10-08 — End: 2021-10-10
  Administered 2021-10-08 – 2021-10-09 (×5): 3 mg via ORAL
  Filled 2021-10-08: qty 9

## 2021-10-08 MED ORDER — DIPHENHYDRAMINE HCL 25 MG PO CAPS: 25.0000 mg | ORAL_CAPSULE | Freq: Four times a day (QID) | ORAL | Status: DC | PRN

## 2021-10-08 MED ORDER — DOCUSATE SODIUM 100 MG PO CAPS
100.0000 mg | ORAL_CAPSULE | Freq: Every day | ORAL | Status: DC
  Administered 2021-10-09 – 2021-10-10 (×2): 100 mg via ORAL
  Filled 2021-10-08 (×5): qty 1

## 2021-10-08 MED ORDER — LORAZEPAM 1 MG PO TABS: 1.0000 mg | ORAL_TABLET | Freq: Four times a day (QID) | ORAL | Status: DC | PRN

## 2021-10-08 MED ORDER — ESCITALOPRAM OXALATE 5 MG PO TABS
15.0000 mg | ORAL_TABLET | Freq: Every day | ORAL | Status: DC
  Administered 2021-10-09 – 2021-10-10 (×2): 15 mg via ORAL
  Filled 2021-10-08 (×4): qty 1

## 2021-10-08 MED ORDER — SALINE SPRAY 0.65 % NA SOLN
1.0000 | NASAL | Status: DC | PRN
  Administered 2021-10-09 (×2): 1 via NASAL

## 2021-10-08 MED ORDER — DIPHENHYDRAMINE HCL 50 MG/ML IJ SOLN: 25.0000 mg | Freq: Four times a day (QID) | INTRAMUSCULAR | Status: DC | PRN

## 2021-10-08 MED ORDER — LORAZEPAM 2 MG/ML IJ SOLN: 1.0000 mg | Freq: Four times a day (QID) | INTRAMUSCULAR | Status: DC | PRN

## 2021-10-08 MED ORDER — QUETIAPINE FUMARATE 25 MG PO TABS
12.5000 mg | ORAL_TABLET | Freq: Every day | ORAL | Status: DC
  Administered 2021-10-08 – 2021-10-10 (×3): 12.5 mg via ORAL
  Filled 2021-10-08 (×4): qty 0.5

## 2021-10-08 MED ORDER — CARIPRAZINE HCL 3 MG PO CAPS
3.0000 mg | ORAL_CAPSULE | Freq: Every day | ORAL | Status: DC
  Administered 2021-10-09 – 2021-10-10 (×2): 3 mg via ORAL
  Filled 2021-10-08 (×3): qty 1

## 2021-10-08 MED ORDER — FERROUS SULFATE 325 (65 FE) MG PO TABS
325.0000 mg | ORAL_TABLET | ORAL | Status: DC
  Administered 2021-10-09: 325 mg via ORAL
  Filled 2021-10-08 (×2): qty 1

## 2021-10-08 NOTE — [facility] Group Notes (Signed)
Adult Psychoeducational Group  Date:  10/08/2021 Time:  1300-1400  Group Topic/Focus: Continuation of the group from Saturday. Looking at the lists that were created and talking about what needs to be done with the homework of 30 positives about themselves.                                     Talking about taking their power back and helping themselves to develop a positive self esteem.      Participation Quality:  did not attend [name]

## 2021-10-08 NOTE — Progress Notes (Signed)
Adult Psychoeducational Group Note  Date:  10/08/2021 Time:  8:34 PM  Group Topic/Focus:  Wrap-Up Group:   The focus of this group is to help patients review their daily goal of treatment and discuss progress on daily workbooks.  Participation Level:  Active  Participation Quality:  Appropriate  Affect:  Appropriate  Cognitive:  Oriented  Insight: Limited  Engagement in Group:  Engaged  Modes of Intervention:  Education and Exploration  Additional Comments:  Patient attended and participated in group tonight. She reports that she learnt  that she should not overdose.  Salley Scarlet [facility] 10/08/2021, 8:34 PM

## 2021-10-08 NOTE — [facility] Group Notes (Signed)
Adult Psychoeducational Group Note Date:  10/07/2021 Time:  0900-1045 Group Topic/Focus: PROGRESSIVE RELAXATION. A group where deep breathing is taught and tensing and relaxation muscle groups is used. Imagery is used as well.  Pts are asked to imagine 3 pillars that hold them up when they are not able to hold themselves up and to share that with the group.  Participation Level:  Pt stated that whe was not feeling well and left the room [name]

## 2021-10-08 NOTE — Progress Notes (Signed)
Pt started to bang her door of her room when she found out that she should stay in her room until Covid results returned.  Pt redirected and encouraged to practice positive coping skills.  Pt was agreeable to coloring until Covid results come back.

## 2021-10-08 NOTE — Progress Notes (Signed)
   10/08/21 1000  Psych Admission Type (Psych Patients Only)  Admission Status Voluntary  Psychosocial Assessment  Patient Complaints Anxiety  Eye Contact Fair  Facial Expression Animated  Affect Apprehensive  Speech Logical/coherent  Interaction Childlike;Needy;Intrusive  Motor Activity Fidgety  Appearance/Hygiene Unremarkable  Behavior Characteristics Anxious  Mood Anxious;Pleasant  Thought Process  Coherency WDL  Content WDL  Delusions None reported or observed  Perception WDL  Hallucination None reported or observed  Judgment Poor  Confusion None  Danger to Self  Current suicidal ideation? Denies  Danger to Others  Danger to Others None reported or observed

## 2021-10-08 NOTE — Group Note (Signed)
[facility] LCSW Group Therapy Note  Date/Time:  10/08/2021  10:00AM-11:00AM  Type of Therapy and Topic:  Group Therapy:  Obstacles at Discharge  Participation Level:  None   Description of Group: In this process group, members discussed their anticipated obstacles to wellness when they discharge.  Patients were asked to share what their goal is at discharge.  We talked in detail about the importance of setting boundaries in our lives and how to set such boundaries.  It was discussed how sometimes we set boundaries with others and sometimes we set those boundaries with ourselves.  We then listened to different songs that dealt with addiction, depression, and anxiety.  The group discussed how they could relate to each song and how each could help them to stay focused on their wellness.    Therapeutic Goals: Patients will determine one major goal that they wish to pursue at discharge, in terms of remaining well Patients will think about and acknowledge the obstacles they think they will face at hospital discharge Patients will be able to realize that they are not alone and others are actually facing similar obstacles Patients will be introduced to ways in which to set boundaries in a healthy, productive fashion Patients will identify how music can help or harm their recovery efforts Patients will explore the use of music as a coping skill  Summary of Patient Progress:  At the beginning of group, patient was present but stated she did not feel well, which caused patients around her to hop up and move across the room.  CSW asked her to go get a mask and wear it throughout group.  She got the mask and only sat there momentarily, left the room before even being asked the questions that all participants were answering in a round robin.  She then returned to the room without a mask, refused to go to her room to get it.  When another mask was provided by MHT, she wore it incorrectly.  She would not pull it over her  nose as requested multiple times by CSW.  When she first reentered the room, the people sitting in that general area got up immediately and moved again.  She left a few moments later and stayed gone.  Therapeutic Modalities: Activity Processing   Selmer Dominion, LCSW

## 2021-10-08 NOTE — Progress Notes (Addendum)
[facility] MD Progress Note  10/08/2021 1:25 PM [name]  MRN:  [id number] Subjective:  [name] is a 31 year old female with a past psychiatric history of schizoaffective disorder bipolar type, anxiety, Asperger's, PTSD, and borderline personality disorder, and a medical history of fibromyalgia, tachycardia, and GERD who presented under IVC after a suicide attempt in which she took a week's worth of all of her prescribed medications.Patient has had multiple inpatient psychiatric admissions, with the last June 2023 at [facility] for a suicide attempt in which she overdosed on a 7-day (or 14) supply of all of her medications.  Yesterday's plan per psychiatry team: -Resume home Vraylar at 1.5 mg daily; will titrate to home dose of 3 mg during this admission -Resume home Lexapro 10 mg daily; will titrate her home dose of 15 mg during this admission -Resume home Seroquel 12.5 mg every morning and 25 mg nightly -Resume home prazosin 1 mg nightly - Continue home gabapentin 300 mg 3 times daily - Resume home propranolol 10 mg twice daily - Resume home Flexeril '10mg'$  qhs     On evaluation today: Patient seen in her room with covers over her head like a hood.  She reports that she does not feel well physically today.  Since last night, she endorses sore throat, cough productive of yellowish sputum, congestion, and chills.  She says that she has had COVID in the past, and her symptoms are not similar, but more so of a common cold.  She reports her mood is "so-so," but denies acute concerns or complaints today.  She reports sleeping well from approximately 9 PM to 3 AM (which is typical for her) and decreased appetite in light of symptoms.  She does report abdominal pain, but it was relieved by bowel movement.  She denies SI, HI, AVH, with voices last reported approximately 1 week ago.  We discussed her abnormal hemoglobin and decreased ferritin.  She reports that although her menstrual cycles are irregular,  she did have a cycle this month which lasted 2 to 3 days and was normal in amount.  She is amenable to starting iron supplementation.  Principal Problem: Schizoaffective disorder, bipolar type ([place]) Diagnosis: Principal Problem:   Schizoaffective disorder, bipolar type ([place]) Active Problems:   Borderline personality disorder ([place])   Asperger syndrome   PTSD (post-traumatic stress disorder)   GAD (generalized anxiety disorder)   Suicide attempt ([place])   Fibromyalgia   Total Time spent with patient: 30 minutes  Past Psychiatric History: See H&P  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 fragile X syndrome    Chronic constipation    Depression    Drug-seeking behavior    Essential tremor    Headache    Overdose of acetaminophen 07/2017   and other meds   Personality disorder ([place])    Schizo-affective psychosis ([place])    Schizoaffective disorder, bipolar type ([place])    Seizures ([place])    Last seizure December 2017   Sleep apnea     Past Surgical History:  Procedure Laterality Date   MOUTH SURGERY  2009 or 2010   Family History:  Family History  Problem Relation Age of Onset   Mental illness Father    Asthma Father    PDD Brother    Seizures Brother    Family Psychiatric  History: See H&P Social History:  Social History   Substance and Sexual Activity  Alcohol Use No   Alcohol/week: 1.0 standard drink of alcohol   Types: 1 Standard drinks or equivalent per week   Comment: denies at this time     Social History   Substance and Sexual Activity  Drug Use No   Comment: History of cocaine use at age 65 for 4 months    Social History   Socioeconomic History   Marital status: Widowed    Spouse name: Not on file   Number of children: 0   Years of education: Not on file   Highest education level: Not on file  Occupational History   Occupation:  disability  Tobacco Use   Smoking status: Former    Packs/day: 0.00    Types: Cigarettes   Smokeless tobacco: Never   Tobacco comments:    Smoked for 2  years age [age]  Vaping Use   Vaping Use: Never used  Substance and Sexual Activity   Alcohol use: No    Alcohol/week: 1.0 standard drink of alcohol    Types: 1 Standard drinks or equivalent per week    Comment: denies at this time   Drug use: No    Comment: History of cocaine use at age 48 for 4 months   Sexual activity: Not Currently    Birth control/protection: None  Other Topics Concern   Not on file  Social History Narrative   Marital status: Widowed      Children: daughter      Lives: with boyfriend, in two story home      Employment:  Disability      Tobacco: quit smoking; smoked for two years.      Alcohol ;none      Drugs: none   Has not traveled outside of the country.   Right handed         Social Determinants of Health   Financial Resource Strain: Not on file  Food Insecurity: Not on file  Transportation Needs: Not on file  Physical Activity: Not on file  Stress: Not on file  Social Connections: Not on file   Additional Social History:        Sleep: Good  Appetite:  Fair  Current Medications: Current Facility-Administered Medications  Medication Dose Route Frequency Provider Last Rate Last Admin   albuterol (VENTOLIN HFA) 108 (90 Base) MCG/ACT inhaler 2 puff  2 puff Inhalation Q6H PRN France Ravens, MD       alum & mag hydroxide-simeth (MAALOX/MYLANTA) 200-200-20 MG/5ML suspension 30 mL  30 mL Oral Q4H PRN France Ravens, MD   30 mL at 10/07/21 1259   cariprazine (VRAYLAR) capsule 1.5 mg  1.5 mg Oral Daily Rosezetta Schlatter, MD   1.5 mg at 10/08/21 0723   cyclobenzaprine (FLEXERIL) tablet 10 mg  10 mg Oral Aliene Altes, MD   10 mg at 10/07/21 2101   dextromethorphan-guaiFENesin ([place]-Bacavi DM) 30-600 MG per 12 hr tablet 1 tablet  1 tablet Oral BID Rosezetta Schlatter, MD       diclofenac Sodium (VOLTAREN) 1 %  topical gel 2 g  2 g Topical QID PRN France Ravens, MD   2 g at 10/08/21 1208   escitalopram (LEXAPRO) tablet 10 mg  10 mg Oral Daily Rosezetta Schlatter, MD   10 mg at 10/08/21 0723   gabapentin (NEURONTIN) capsule 300 mg  300 mg Oral TID Rosezetta Schlatter, MD   300 mg at 10/08/21 1123  hydrOXYzine (ATARAX) tablet 25 mg  25 mg Oral QID PRN Harlow Asa, MD   25 mg at 10/08/21 0326   ibuprofen (ADVIL) tablet 200 mg  200 mg Oral Daily PRN France Ravens, MD   200 mg at 10/07/21 1240   lidocaine (LIDODERM) 5 % 1 patch  1 patch Transdermal Q24H [place], Ene A, NP   1 patch at 10/07/21 2101   magnesium hydroxide (MILK OF MAGNESIA) suspension 30 mL  30 mL Oral Daily PRN France Ravens, MD   30 mL at 10/07/21 1511   menthol-cetylpyridinium (CEPACOL) lozenge 3 mg  1 lozenge Oral PRN Rosezetta Schlatter, MD       mometasone-formoterol (DULERA) 100-5 MCG/ACT inhaler 2 puff  2 puff Inhalation BID Rosezetta Schlatter, MD   2 puff at 10/08/21 0728   pantoprazole (PROTONIX) EC tablet 40 mg  40 mg Oral Daily Rosezetta Schlatter, MD   40 mg at 10/08/21 9379   prazosin (MINIPRESS) capsule 1 mg  1 mg Oral QHS Rosezetta Schlatter, MD   1 mg at 10/07/21 2101   propranolol (INDERAL) tablet 10 mg  10 mg Oral BID Rosezetta Schlatter, MD   10 mg at 10/08/21 0723   QUEtiapine (SEROQUEL) tablet 12.5 mg  12.5 mg Oral Daily [name], Brenn Deziel E, MD   12.5 mg at 10/08/21 0916   QUEtiapine (SEROQUEL) tablet 25 mg  25 mg Oral QHS Rosezetta Schlatter, MD   25 mg at 10/07/21 2101   sodium chloride (OCEAN) 0.65 % nasal spray 1 spray  1 spray Each Nare PRN Rosezetta Schlatter, MD        Lab Results:  Results for orders placed or performed during the hospital encounter of 10/06/21 (from the past 48 hour(s))  Iron and TIBC     Status: None   Collection Time: 10/07/21  6:33 AM  Result Value Ref Range   Iron 50 28 - 170 ug/dL   TIBC 361 250 - 450 ug/dL   Saturation Ratios 14 10.4 - 31.8 %   UIBC 311 ug/dL    Comment: Performed at [facility], [place]  [address] [place], [place] [address]  Ferritin     Status: Abnormal   Collection Time: 10/07/21  6:33 AM  Result Value Ref Range   Ferritin 8 (L) 11 - 307 ng/mL    Comment: Performed at [facility], [place] [address], [place], [place] [address]  Reticulocytes     Status: Abnormal   Collection Time: 10/07/21  6:33 AM  Result Value Ref Range   Retic Ct Pct 1.7 0.4 - 3.1 %   RBC. 4.07 3.87 - 5.11 MIL/uL   Retic Count, Absolute 70.0 19.0 - 186.0 K/uL   Immature Retic Fract 17.1 (H) 2.3 - 15.9 %    Comment: Performed at [facility], [place] [address], [place], [place] [address]  Lipid panel     Status: Abnormal   Collection Time: 10/07/21  6:33 AM  Result Value Ref Range   Cholesterol 148 0 - 200 mg/dL   Triglycerides 173 (H) <150 mg/dL   HDL 39 (L) >40 mg/dL   Total CHOL/HDL Ratio 3.8 RATIO   VLDL 35 0 - 40 mg/dL   LDL Cholesterol 74 0 - 99 mg/dL    Comment:        Total Cholesterol/HDL:CHD Risk Coronary Heart Disease Risk Table                     Men  Women  1/2 Average Risk   3.4   3.3  Average Risk       5.0   4.4  2 X Average Risk   9.6   7.1  3 X Average Risk  23.4   11.0        Use the calculated Patient Ratio above and the CHD Risk Table to determine the patient's CHD Risk.        ATP III CLASSIFICATION (LDL):  <100     mg/dL   Optimal  100-129  mg/dL   Near or Above                    Optimal  130-159  mg/dL   Borderline  160-189  mg/dL   High  >190     mg/dL   Very High Performed at [facility] [address] [place], [place] [address]   TSH     Status: None   Collection Time: 10/07/21  6:33 AM  Result Value Ref Range   TSH 1.511 0.350 - 4.500 uIU/mL    Comment: Performed by a 3rd Generation assay with a functional sensitivity of <=0.01 uIU/mL. Performed at [facility], [place] [address] [place], [place] [address]   Hemoglobin A1c     Status: None   Collection Time: 10/07/21  6:33 AM   Result Value Ref Range   Hgb A1c MFr Bld 5.0 4.8 - 5.6 %    Comment: (NOTE) Pre diabetes:          5.7%-6.4%  Diabetes:              >6.4%  Glycemic control for   <7.0% adults with diabetes    Mean Plasma Glucose 96.8 mg/dL    Comment: Performed at [facility] [address] [place], [place] [address]    Blood Alcohol level:  Lab Results  Component Value Date   [facility] <10 10/04/2021   ETH <10 15/40/0867    Metabolic Disorder Labs: Lab Results  Component Value Date   HGBA1C 5.0 10/07/2021   MPG 96.8 10/07/2021   MPG 96.8 09/02/2021   Lab Results  Component Value Date   PROLACTIN 66.2 (H) 03/02/2021   PROLACTIN 66.1 (H) 02/04/2020   Lab Results  Component Value Date   CHOL 148 10/07/2021   TRIG 173 (H) 10/07/2021   HDL 39 (L) 10/07/2021   CHOLHDL 3.8 10/07/2021   VLDL 35 10/07/2021   LDLCALC 74 10/07/2021   LDLCALC 165 (H) 05/01/2021    Physical Findings: AIMS: Not assessed today  Musculoskeletal: Strength & Muscle Tone: within normal limits Gait & Station: normal Patient leans: N/A  Psychiatric Specialty Exam:  Presentation  General Appearance: Appropriate for Environment; Casual (With covers wrapped around head)   Eye Contact:Good   Speech:Clear and Coherent; Normal Rate   Speech Volume:Normal   Handedness:Right    Mood and Affect  Mood:-- ("I feel sick"-physically)   Affect:Congruent; Appropriate    Thought Process  Thought Processes:Goal Directed   Descriptions of Associations:Intact   Orientation:Full (Time, Place and Person)   Thought Content:Logical (Denies SI, HI, AVH, paranoia, and other first rank symptoms)   History of Schizophrenia/Schizoaffective disorder:No   Duration of Psychotic Symptoms:N/A   Hallucinations:Hallucinations: None  Ideas of Reference:None   Suicidal Thoughts:Suicidal Thoughts: No  Homicidal Thoughts:Homicidal Thoughts: No   Sensorium  Memory:Immediate Good; Recent  Good   Judgment:Fair   Insight:Fair    Executive Functions  Concentration:Good  Attention Span:Good   Recall:Good   Fund of Knowledge:Good   Language:Good    Psychomotor Activity  Psychomotor Activity:Psychomotor Activity: Normal   Assets  Assets:Communication Skills; Desire for Improvement; Financial Resources/Insurance; Housing; Intimacy; Social Support    Sleep  Sleep:Sleep: Good    Physical Exam: Physical Exam Vitals reviewed.  Constitutional:      General: She is not in acute distress.    Appearance: She is not toxic-appearing.  HENT:     Head: Normocephalic and atraumatic.     Mouth/Throat:     Mouth: Mucous membranes are moist.     Pharynx: Oropharynx is clear.  Pulmonary:     Effort: Pulmonary effort is normal.  Skin:    General: Skin is warm.     Findings: Bruising present.     Comments: 2 large bruises to L forearm; multiple well-healed scars to bilateral forearms from previous self-harm.  Neurological:     General: No focal deficit present.     Mental Status: She is alert and oriented to person, place, and time.     Motor: No weakness.     Gait: Gait normal.    Review of Systems  Constitutional:  Positive for chills. Negative for fever.  HENT:  Positive for congestion and sore throat.   Respiratory:  Positive for cough and sputum production. Negative for shortness of breath.   Cardiovascular:  Negative for chest pain.  Gastrointestinal:  Positive for abdominal pain. Negative for constipation, nausea and vomiting.  Genitourinary: Negative.   Neurological:  Negative for headaches.   Blood pressure 99/66, pulse 100, temperature (!) 97.5 F (36.4 C), temperature source Oral, resp. rate 16, height '5\' 5"'$  (1.651 m), weight 89.8 kg, SpO2 100 %. Body mass index is 32.95 kg/m.   Treatment Plan Summary: ASSESSMENT:  Schizoaffective disorder, bipolar type ([place]) Active Problems:   Borderline personality disorder ([place])   Asperger  syndrome   PTSD (post-traumatic stress disorder)   GAD (generalized anxiety disorder)   Suicide attempt ([place])   Fibromyalgia   PLAN: Safety and Monitoring:             -- Involuntary admission to inpatient psychiatric unit for safety, stabilization and treatment             -- Daily contact with patient to assess and evaluate symptoms and progress in treatment             -- Patient's case to be discussed in multi-disciplinary team meeting             -- Observation Level : q15 minute checks             -- Vital signs:  q12 hours             -- Precautions: suicide, elopement, and assault   2. Psychiatric Diagnoses and Treatment:  # Schizoaffective disorder, bipolar type # Borderline personality disorder # Asperger syndrome #PTSD - Continue home Vraylar at 1.5 mg daily; will titrate to home dose of 3 mg tomorrow - Continue home Lexapro 10 mg daily; will titrate her home dose of 15 mg tomorrow - Continue home Seroquel 12.5 mg every morning and 25 mg nightly - Continue home prazosin 1 mg nightly   #GAD # Fibromyalgia #History of tachycardia - Continue home gabapentin 300 mg 3 times daily - Continue home propranolol 10 mg twice daily -Continue home Flexeril '10mg'$  qhs    PRN: Hydroxyzine 25 mg 4 times daily as needed, ibuprofen 200 mg daily as  needed   -- The risks/benefits/side-effects/alternatives to this medication were discussed in detail with the patient and time was given for questions. The patient consents to medication trial.              -- Metabolic profile and EKG monitoring obtained while on an atypical antipsychotic  BMI: 32.95 Lipid Panel: TG 173 HbgA1c: 5.0% QTc: 473 on 8/31             -- Encouraged patient to participate in unit milieu and in scheduled group therapies    3. Medical Diagnoses and Treatment: # GERD - Continue home Protonix 40 mg   # Asthma - 20 home equivalent of Advair, Dulera 2 puffs twice daily - Rescue inhaler available every 6 hours as  needed   #History of arthritis - Continue home Voltaren gel as needed  # URI symptoms, acute - Respiratory panel pending - Start Mucinex DM twice daily - Cepacol lozenges as needed for sore throat - [place] nasal spray as needed congestion - Advised patient to wear a mask while on the unit and rechecking respiratory panel  #Anemia and low ferritin (H/H 10.9/33.9 and ferritin  - Start ferrous sulfate '325mg'$  qod with Colace '100mg'$  daily - f/u with PCP after discharge - Iron and TIBC WNL  #Mild Thrombocytopenia (124) - Will need f/u with PCP after discharge   #Mildly elevated triglycerides - Healthy diet encouraged - f/u with PCP after discharge for monitoring while on antipsychotic medications  #EKG change  (inferior infarct age undetermined - no change since previous tracing per cardiology over-read) -- Asymptomatic at this time - f/u with PCP after discharge   4. Discharge Planning:      -- Social work and case management to assist with discharge planning and identification of hospital follow-up needs prior to discharge             -- Estimated Date of Discharge: 9/5 or 9/6             -- Discharge Concerns: Need to establish a safety plan; Medication compliance and effectiveness             -- Discharge Goals: Return home with outpatient referrals for mental health follow-up including medication management/psychotherapy     Rosezetta Schlatter, MD 10/08/2021, 1:25 PM

## 2021-10-09 ENCOUNTER — Encounter (HOSPITAL_COMMUNITY): Payer: Self-pay

## 2021-10-09 MED ORDER — WHITE PETROLATUM EX OINT
TOPICAL_OINTMENT | CUTANEOUS | Status: AC
  Filled 2021-10-09: qty 5

## 2021-10-09 MED ORDER — DM-GUAIFENESIN ER 30-600 MG PO TB12
1.0000 | ORAL_TABLET | Freq: Two times a day (BID) | ORAL | Status: DC | PRN
Start: 2021-10-09 — End: 2021-10-10
  Administered 2021-10-10: 1 via ORAL
  Filled 2021-10-09: qty 1

## 2021-10-09 MED ORDER — HYDROXYZINE HCL 25 MG PO TABS
25.0000 mg | ORAL_TABLET | Freq: Four times a day (QID) | ORAL | Status: DC | PRN
  Administered 2021-10-09: 25 mg via ORAL
  Filled 2021-10-09: qty 1

## 2021-10-09 NOTE — Progress Notes (Addendum)
[name] MD Progress Note  10/09/2021 1:06 PM [name]  MRN:  [id number] Subjective:  [name] is a 31 year old female with a past psychiatric history of schizoaffective disorder bipolar type, anxiety, Asperger's, PTSD, and borderline personality disorder, and a medical history of fibromyalgia, tachycardia, and GERD who presented under IVC after a suicide attempt in which she took a week's worth of all of her prescribed medications.Patient has had multiple inpatient psychiatric admissions, with the last June 2023 at [facility] for a suicide attempt in which she overdosed on a 7-day (or 14) supply of all of her medications.    On evaluation today: Patient seen and assessed at bedside.  Patient denies SI/HI/AVH.  Patient appears motivated to discharge tomorrow.  Patient reports that she has 2 jobs to work and cannot afford to stay in the hospital for much longer.  Patient admits that her medication overdose was not intent to kill herself but was due to her being triggered by her ex-boyfriend.  Patient reports that this trigger will be resolved on Thursday when she evicts her ex-boyfriend.  Patient reports that she is otherwise doing well.  Patient states that she is sleeping well and eating well.  Patient denies any mood or anxiety symptoms at this time.  Patient eager to discharge tomorrow if possible.  Patient denies any explicit somatic complaints at this time.  Patient reports that she will not need any prescription upon discharge besides for ferrous sulfate.  All questions were addressed.  Principal Problem: Schizoaffective disorder, bipolar type ([place]) Diagnosis: Principal Problem:   Schizoaffective disorder, bipolar type ([place]) Active Problems:   Borderline personality disorder ([place])   Asperger syndrome   PTSD (post-traumatic stress disorder)   GAD (generalized anxiety disorder)   Suicide attempt ([place])   Fibromyalgia   Total Time spent with patient: 30 minutes  Past Psychiatric History:  See H&P  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 fragile X syndrome    Chronic constipation    Depression    Drug-seeking behavior    Essential tremor    Headache    Overdose of acetaminophen 07/2017   and other meds   Personality disorder ([place])    Schizo-affective psychosis ([place])    Schizoaffective disorder, bipolar type ([place])    Seizures ([place])    Last seizure December 2017   Sleep apnea     Past Surgical History:  Procedure Laterality Date   MOUTH SURGERY  2009 or 2010   Family History:  Family History  Problem Relation Age of Onset   Mental illness Father    Asthma Father    PDD Brother    Seizures Brother    Family Psychiatric  History: See H&P Social History:  Social History   Substance and Sexual Activity  Alcohol Use No   Alcohol/week: 1.0 standard drink of alcohol   Types: 1 Standard drinks or equivalent per week   Comment: denies at this time     Social History   Substance and Sexual Activity  Drug Use No   Comment: History of cocaine use at age 45 for 4 months    Social History   Socioeconomic History   Marital status: Widowed    Spouse name: Not on file   Number of children: 0   Years of education: Not on file  Highest education level: Not on file  Occupational History   Occupation: disability  Tobacco Use   Smoking status: Former    Packs/day: 0.00    Types: Cigarettes   Smokeless tobacco: Never   Tobacco comments:    Smoked for 2  years age [age]  Vaping Use   Vaping Use: Never used  Substance and Sexual Activity   Alcohol use: No    Alcohol/week: 1.0 standard drink of alcohol    Types: 1 Standard drinks or equivalent per week    Comment: denies at this time   Drug use: No    Comment: History of cocaine use at age 87 for 4 months   Sexual activity: Not Currently    Birth control/protection: None  Other Topics  Concern   Not on file  Social History Narrative   Marital status: Widowed      Children: daughter      Lives: with boyfriend, in two story home      Employment:  Disability      Tobacco: quit smoking; smoked for two years.      Alcohol ;none      Drugs: none   Has not traveled outside of the country.   Right handed         Social Determinants of Health   Financial Resource Strain: Not on file  Food Insecurity: Not on file  Transportation Needs: Not on file  Physical Activity: Not on file  Stress: Not on file  Social Connections: Not on file   Additional Social History:        Sleep: Good  Appetite:  Fair  Current Medications: Current Facility-Administered Medications  Medication Dose Route Frequency Provider Last Rate Last Admin   albuterol (VENTOLIN HFA) 108 (90 Base) MCG/ACT inhaler 2 puff  2 puff Inhalation Q6H PRN France Ravens, MD   2 puff at 10/08/21 2101   alum & mag hydroxide-simeth (MAALOX/MYLANTA) 200-200-20 MG/5ML suspension 30 mL  30 mL Oral Q4H PRN France Ravens, MD   30 mL at 10/07/21 1259   cariprazine (VRAYLAR) capsule 3 mg  3 mg Oral Daily Viann Fish E, MD   3 mg at 10/09/21 0855   cyclobenzaprine (FLEXERIL) tablet 10 mg  10 mg Oral Aliene Altes, MD   10 mg at 10/08/21 2102   dextromethorphan-guaiFENesin ([place] DM) 30-600 MG per 12 hr tablet 1 tablet  1 tablet Oral BID Rosezetta Schlatter, MD   1 tablet at 10/09/21 0744   diclofenac Sodium (VOLTAREN) 1 % topical gel 2 g  2 g Topical QID PRN France Ravens, MD   2 g at 10/09/21 0749   diphenhydrAMINE (BENADRYL) capsule 25 mg  25 mg Oral Q6H PRN Rosezetta Schlatter, MD       Or   diphenhydrAMINE (BENADRYL) injection 25 mg  25 mg Intramuscular Q6H PRN Rosezetta Schlatter, MD       docusate sodium (COLACE) capsule 100 mg  100 mg Oral Daily [name], Rosendo Couser E, MD   100 mg at 10/09/21 0748   escitalopram (LEXAPRO) tablet 15 mg  15 mg Oral Daily Viann Fish E, MD   15 mg at 10/09/21 0744   ferrous sulfate tablet 325 mg  325  mg Oral Aline Brochure, Kamber Vignola E, MD   325 mg at 10/09/21 0956   gabapentin (NEURONTIN) capsule 300 mg  300 mg Oral TID Rosezetta Schlatter, MD   300 mg at 10/09/21 1156   hydrOXYzine (ATARAX) tablet 25 mg  25 mg Oral QID  PRN Harlow Asa, MD   25 mg at 10/08/21 1432   hydrOXYzine (ATARAX) tablet 50 mg  50 mg Oral TID PRN Rosezetta Schlatter, MD       ibuprofen (ADVIL) tablet 200 mg  200 mg Oral Daily PRN France Ravens, MD   200 mg at 10/08/21 1451   lidocaine (LIDODERM) 5 % 1 patch  1 patch Transdermal Q24H [place], Ene A, NP   1 patch at 10/08/21 2102   LORazepam (ATIVAN) tablet 1 mg  1 mg Oral Q6H PRN Rosezetta Schlatter, MD       Or   LORazepam (ATIVAN) injection 1 mg  1 mg Intramuscular Q6H PRN Rosezetta Schlatter, MD       magnesium hydroxide (MILK OF MAGNESIA) suspension 30 mL  30 mL Oral Daily PRN France Ravens, MD   30 mL at 10/07/21 1511   menthol-cetylpyridinium (CEPACOL) lozenge 3 mg  1 lozenge Oral PRN Rosezetta Schlatter, MD   3 mg at 10/09/21 1013   mometasone-formoterol (DULERA) 100-5 MCG/ACT inhaler 2 puff  2 puff Inhalation BID Rosezetta Schlatter, MD   2 puff at 10/09/21 0748   pantoprazole (PROTONIX) EC tablet 40 mg  40 mg Oral Daily Rosezetta Schlatter, MD   40 mg at 10/09/21 0743   prazosin (MINIPRESS) capsule 1 mg  1 mg Oral QHS Rosezetta Schlatter, MD   1 mg at 10/08/21 2102   propranolol (INDERAL) tablet 10 mg  10 mg Oral BID Rosezetta Schlatter, MD   10 mg at 10/09/21 0743   QUEtiapine (SEROQUEL) tablet 12.5 mg  12.5 mg Oral Daily [name], Ha Placeres E, MD   12.5 mg at 10/09/21 0744   QUEtiapine (SEROQUEL) tablet 25 mg  25 mg Oral QHS Rosezetta Schlatter, MD   25 mg at 10/08/21 2102   sodium chloride (OCEAN) 0.65 % nasal spray 1 spray  1 spray Each Nare PRN Rosezetta Schlatter, MD   1 spray at 10/09/21 0602    Lab Results:  Results for orders placed or performed during the hospital encounter of 10/06/21 (from the past 48 hour(s))  Resp Panel by RT-PCR (Flu A&B, Covid) Anterior Nasal Swab     Status: None   Collection  Time: 10/08/21  2:08 PM   Specimen: Anterior Nasal Swab  Result Value Ref Range   SARS Coronavirus 2 by RT PCR NEGATIVE NEGATIVE    Comment: (NOTE) SARS-CoV-2 target nucleic acids are NOT DETECTED.  The SARS-CoV-2 RNA is generally detectable in upper respiratory specimens during the acute phase of infection. The lowest concentration of SARS-CoV-2 viral copies this assay can detect is 138 copies/mL. A negative result does not preclude SARS-Cov-2 infection and should not be used as the sole basis for treatment or other patient management decisions. A negative result may occur with  improper specimen collection/handling, submission of specimen other than nasopharyngeal swab, presence of viral mutation(s) within the areas targeted by this assay, and inadequate number of viral copies(<138 copies/mL). A negative result must be combined with clinical observations, patient history, and epidemiological information. The expected result is Negative.  Fact Sheet for Patients:  EntrepreneurPulse.com.au  Fact Sheet for Healthcare Providers:  IncredibleEmployment.be  This test is no t yet approved or cleared by the [place] FDA and  has been authorized for detection and/or diagnosis of SARS-CoV-2 by FDA under an Emergency Use Authorization (EUA). This EUA will remain  in effect (meaning this test can be used) for the duration of the COVID-19 declaration under Section 564(b)(1) of the Act, 21  U.S.C.section 360bbb-3(b)(1), unless the authorization is terminated  or revoked sooner.       Influenza A by PCR NEGATIVE NEGATIVE   Influenza B by PCR NEGATIVE NEGATIVE    Comment: (NOTE) The Xpert Xpress SARS-CoV-2/FLU/RSV plus assay is intended as an aid in the diagnosis of influenza from Nasopharyngeal swab specimens and should not be used as a sole basis for treatment. Nasal washings and aspirates are unacceptable for Xpert Xpress  SARS-CoV-2/FLU/RSV testing.  Fact Sheet for Patients: EntrepreneurPulse.com.au  Fact Sheet for Healthcare Providers: IncredibleEmployment.be  This test is not yet approved or cleared by the [place] FDA and has been authorized for detection and/or diagnosis of SARS-CoV-2 by FDA under an Emergency Use Authorization (EUA). This EUA will remain in effect (meaning this test can be used) for the duration of the COVID-19 declaration under Section 564(b)(1) of the Act, 21 U.S.C. section 360bbb-3(b)(1), unless the authorization is terminated or revoked.  Performed at [facility], [place] [address] [place], [place] [address]     Blood Alcohol level:  Lab Results  Component Value Date   ETH <10 10/04/2021   ETH <10 33/82/5053    Metabolic Disorder Labs: Lab Results  Component Value Date   HGBA1C 5.0 10/07/2021   MPG 96.8 10/07/2021   MPG 96.8 09/02/2021   Lab Results  Component Value Date   PROLACTIN 66.2 (H) 03/02/2021   PROLACTIN 66.1 (H) 02/04/2020   Lab Results  Component Value Date   CHOL 148 10/07/2021   TRIG 173 (H) 10/07/2021   HDL 39 (L) 10/07/2021   CHOLHDL 3.8 10/07/2021   VLDL 35 10/07/2021   LDLCALC 74 10/07/2021   LDLCALC 165 (H) 05/01/2021    Physical Findings: AIMS: Not assessed today  Musculoskeletal: Strength & Muscle Tone: within normal limits Gait & Station: normal Patient leans: N/A  Psychiatric Specialty Exam:  Presentation  General Appearance: Casual and appropriate  Eye Contact:Good   Speech:Clear and Coherent; Normal Rate   Speech Volume:Normal   Handedness:Right    Mood and Affect  Mood:"good" - appears anxious  Affect:childlike at times, anxious    Thought Process  Thought Processes:Ruminative about discharge planning, concrete   Descriptions of Associations:Intact   Orientation:Full (Time, Place and Person)   Thought Content:Logical (Denies SI, HI,  AVH, paranoia, and other first rank symptoms)   History of Schizophrenia/Schizoaffective disorder:No   Duration of Psychotic Symptoms:N/A   Hallucinations:Hallucinations: None  Ideas of Reference:None   Suicidal Thoughts:Suicidal Thoughts: No  Homicidal Thoughts:Homicidal Thoughts: No   Sensorium  Memory:Immediate Good; Recent Good   Judgment:Fair   Insight:Poor    Executive Functions  Concentration:Good   Attention Span:Good   [place] of Knowledge:Good   Language:Good    Psychomotor Activity  Psychomotor Activity:Psychomotor Activity: Normal   Assets  Assets:Communication Skills; Desire for Improvement; Financial Resources/Insurance; Housing; Intimacy; Social Support    Sleep  Total time unrecorded   Physical Exam Vitals reviewed.  Constitutional:      General: She is not in acute distress.    Appearance: She is not toxic-appearing.  HENT:     Head: Normocephalic and atraumatic.     Mouth/Throat:     Mouth: Mucous membranes are moist.     Pharynx: Oropharynx is clear.  Pulmonary:     Effort: Pulmonary effort is normal.  Skin:    General: Skin is warm.     Findings: Bruising present.     Comments: 2 large bruises to L forearm; multiple well-healed  scars to bilateral forearms from previous self-harm.  Neurological:     General: No focal deficit present.     Mental Status: She is alert and oriented to person, place, and time.     Motor: No weakness.     Gait: Gait normal.   Review of Systems  HENT:  Positive for congestion.   Respiratory:  Positive for cough. Negative for shortness of breath.   Cardiovascular:  Negative for chest pain.  Gastrointestinal:  Negative for constipation, diarrhea, nausea and vomiting.  Genitourinary: Negative.    Blood pressure (!) 104/51, pulse (!) 104, temperature (!) 97.4 F (36.3 C), resp. rate 18, height '5\' 5"'$  (1.651 m), weight 89.8 kg, SpO2 100 %. Body mass index is 32.95  kg/m.   Treatment Plan Summary: ASSESSMENT:  Schizoaffective disorder, bipolar type ([place]) Active Problems:   Borderline personality disorder ([place])   Asperger syndrome   PTSD (post-traumatic stress disorder)   GAD (generalized anxiety disorder)   Suicide attempt ([place])   Fibromyalgia   PLAN: Safety and Monitoring:             -- Involuntary admission to inpatient psychiatric unit for safety, stabilization and treatment             -- Daily contact with patient to assess and evaluate symptoms and progress in treatment             -- Patient's case to be discussed in multi-disciplinary team meeting             -- Observation Level : q15 minute checks             -- Vital signs:  q12 hours             -- Precautions: suicide, elopement, and assault   2. Psychiatric Diagnoses and Treatment:  # Schizoaffective disorder, bipolar type # Borderline personality disorder # Asperger syndrome #PTSD - Increase to home Vraylar 3 mg dose - Increase to home Lexapro 15 mg daily dose - Continue home Seroquel 12.5 mg every morning and 25 mg nightly - Continue home prazosin 1 mg nightly   #GAD # Fibromyalgia #History of tachycardia - Continue home gabapentin 300 mg 3 times daily - Continue home propranolol 10 mg twice daily -Continue home Flexeril '10mg'$  qhs    PRN: Hydroxyzine 25 mg 4 times daily as needed, ibuprofen 200 mg daily as needed   -- The risks/benefits/side-effects/alternatives to this medication were discussed in detail with the patient and time was given for questions. The patient consents to medication trial.              -- Metabolic profile and EKG monitoring obtained while on an atypical antipsychotic  BMI: 32.95 Lipid Panel: TG 173 HbgA1c: 5.0% QTc: 473 on 8/31             -- Encouraged patient to participate in unit milieu and in scheduled group therapies    3. Medical Diagnoses and Treatment: # GERD - Continue home Protonix 40 mg   # Asthma - 20 home equivalent of  Advair, Dulera 2 puffs twice daily - Rescue inhaler available every 6 hours as needed   #History of arthritis - Continue home Voltaren gel as needed  # URI symptoms, acute - Respiratory panel negative - Has Mucinex DM twice daily - will change to PRN - Cepacol lozenges as needed for sore throat - [place] nasal spray as needed congestion - Advised patient to wear a mask while on the unit  with residual cough  #Anemia and low ferritin (H/H 10.9/33.9 and ferritin 8) - Continue ferrous sulfate '325mg'$  qod with Colace '100mg'$  daily - f/u with PCP after discharge - Iron and TIBC WNL  #Mild Thrombocytopenia (124) - Will need f/u with PCP after discharge   #Mildly elevated triglycerides - Healthy diet encouraged - f/u with PCP after discharge for monitoring while on antipsychotic medications  #EKG change  (inferior infarct age undetermined - no change since previous tracing per cardiology over-read) -- Asymptomatic at this time - f/u with PCP after discharge   4. Discharge Planning:      -- Social work and case management to assist with discharge planning and identification of hospital follow-up needs prior to discharge             -- Estimated Date of Discharge: 9/5             -- Discharge Concerns: Need to establish a safety plan; Medication compliance and effectiveness             -- Discharge Goals: Return home with outpatient referrals for mental health follow-up including medication management/psychotherapy     France Ravens, MD 10/09/2021, 1:06 PM  Attestation signed by Harlow Asa, MD at 10/09/2021  3:03 PM   I have independently evaluated the patient during a face-to-face assessment on 10/09/21. I reviewed the patient's chart, and I participated in key portions of the service. I discussed the case with the [employer], and I agree with the assessment and plan of care as documented in the [employer] note with edits.    I personally spent 25 minutes on the unit in direct patient  care. The direct patient care time included face-to-face time with the patient, reviewing the patient's chart, communicating with other professionals, and coordinating care. Greater than 50% of this time was spent in counseling or coordinating care with the patient regarding goals of hospitalization, psycho-education, and discharge planning needs.   On exam, patient is ruminative about desire for discharge. She was confronted with her childlike and needy behaviors yesterday and was told she needs to have a good day before discharge is considered. She states she feels staff and peers were "picking on her" and she was encouraged to use her DBT skills and to work on frustration intolerance on the unit.  She denies SI, HI, AVH, paranoia or delusions. She denies ideas of reference or first rank symptoms. She denies medication side-effects. Will continue to monitor. Viann Fish, MD, [facility]

## 2021-10-09 NOTE — Group Note (Signed)
LCSW Group Therapy  Type of Therapy and Topic:  Group Therapy: Thoughts, Feelings, and Actions  Participation Level:  Did Not Attend   Description of Group:   In this group, each patient discussed their previous experiencing and understanding of overthinking, identifying the harmful impact on their lives. As a group, each patient was introduced to the basic concepts of Cognitive Behavioral Therapy: that thoughts, feelings, and actions are all connected and influence one another. They were given examples of how overthinking can affect our feelings, actions, and vise versa. The group was then asked to analyze how overthinking was harmful and brainstorm alternative thinking patterns/reactions to the example situation. Then, each group member filled out and identified their own example situation in which a problem situation caused their thoughts, feelings, and actions to be negatively impacted; they were asked to come up with 3 new (more adaptive/positive) thoughts that led to 3 new feelings and actions.  Therapeutic Goals: Patients will review and discuss their past experience with overthinking. Patients will learn the basics of the CBT model through group-led examples.. Patients will identify situations where they may have negative thoughts, feelings, or actions and will then reframe the situation using more positive thoughts to react differently.  Summary of Patient Progress:  Did not attend  Therapeutic Modalities:   Cognitive [place], LCSW 10/09/2021  2:26 PM

## 2021-10-09 NOTE — BH IP Treatment Plan (Signed)
Interdisciplinary Treatment and Diagnostic Plan Update  10/09/2021 Time of Session: 11am [name] MRN: [id number]  Principal Diagnosis: Schizoaffective disorder, bipolar type ([place])  Secondary Diagnoses: Principal Problem:   Schizoaffective disorder, bipolar type ([place]) Active Problems:   Borderline personality disorder ([place])   Asperger syndrome   PTSD (post-traumatic stress disorder)   GAD (generalized anxiety disorder)   Suicide attempt ([place])   Fibromyalgia   Current Medications:  Current Facility-Administered Medications  Medication Dose Route Frequency Provider Last Rate Last Admin   albuterol (VENTOLIN HFA) 108 (90 Base) MCG/ACT inhaler 2 puff  2 puff Inhalation Q6H PRN France Ravens, MD   2 puff at 10/08/21 2101   alum & mag hydroxide-simeth (MAALOX/MYLANTA) 200-200-20 MG/5ML suspension 30 mL  30 mL Oral Q4H PRN France Ravens, MD   30 mL at 10/07/21 1259   cariprazine (VRAYLAR) capsule 3 mg  3 mg Oral Daily [name], Amy E, MD   3 mg at 10/09/21 0855   cyclobenzaprine (FLEXERIL) tablet 10 mg  10 mg Oral Aliene Altes, MD   10 mg at 10/08/21 2102   dextromethorphan-guaiFENesin ([place] DM) 30-600 MG per 12 hr tablet 1 tablet  1 tablet Oral BID PRN Harlow Asa, MD       diclofenac Sodium (VOLTAREN) 1 % topical gel 2 g  2 g Topical QID PRN France Ravens, MD   2 g at 10/09/21 0749   diphenhydrAMINE (BENADRYL) capsule 25 mg  25 mg Oral Q6H PRN Rosezetta Schlatter, MD       Or   diphenhydrAMINE (BENADRYL) injection 25 mg  25 mg Intramuscular Q6H PRN Rosezetta Schlatter, MD       docusate sodium (COLACE) capsule 100 mg  100 mg Oral Daily [name], Amy E, MD   100 mg at 10/09/21 0748   escitalopram (LEXAPRO) tablet 15 mg  15 mg Oral Daily Viann Fish E, MD   15 mg at 10/09/21 0744   ferrous sulfate tablet 325 mg  325 mg Oral Aline Brochure, Amy E, MD   325 mg at 10/09/21 0956   gabapentin (NEURONTIN) capsule 300 mg  300 mg Oral TID Rosezetta Schlatter, MD   300 mg at 10/09/21 1156    hydrOXYzine (ATARAX) tablet 25 mg  25 mg Oral Q6H PRN Harlow Asa, MD       ibuprofen (ADVIL) tablet 200 mg  200 mg Oral Daily PRN France Ravens, MD   200 mg at 10/08/21 1451   lidocaine (LIDODERM) 5 % 1 patch  1 patch Transdermal Q24H [place], Ene A, NP   1 patch at 10/08/21 2102   LORazepam (ATIVAN) tablet 1 mg  1 mg Oral Q6H PRN Rosezetta Schlatter, MD       Or   LORazepam (ATIVAN) injection 1 mg  1 mg Intramuscular Q6H PRN Rosezetta Schlatter, MD       magnesium hydroxide (MILK OF MAGNESIA) suspension 30 mL  30 mL Oral Daily PRN France Ravens, MD   30 mL at 10/07/21 1511   menthol-cetylpyridinium (CEPACOL) lozenge 3 mg  1 lozenge Oral PRN Rosezetta Schlatter, MD   3 mg at 10/09/21 1502   mometasone-formoterol (DULERA) 100-5 MCG/ACT inhaler 2 puff  2 puff Inhalation BID Rosezetta Schlatter, MD   2 puff at 10/09/21 0748   pantoprazole (PROTONIX) EC tablet 40 mg  40 mg Oral Daily Rosezetta Schlatter, MD   40 mg at 10/09/21 0743   prazosin (MINIPRESS) capsule 1 mg  1 mg Oral QHS Rosezetta Schlatter, MD  1 mg at 10/08/21 2102   propranolol (INDERAL) tablet 10 mg  10 mg Oral BID Rosezetta Schlatter, MD   10 mg at 10/09/21 0743   QUEtiapine (SEROQUEL) tablet 12.5 mg  12.5 mg Oral Daily [name], Amy E, MD   12.5 mg at 10/09/21 0744   QUEtiapine (SEROQUEL) tablet 25 mg  25 mg Oral QHS Rosezetta Schlatter, MD   25 mg at 10/08/21 2102   sodium chloride (OCEAN) 0.65 % nasal spray 1 spray  1 spray Each Nare PRN Rosezetta Schlatter, MD   1 spray at 10/09/21 0602   PTA Medications: Medications Prior to Admission  Medication Sig Dispense Refill Last Dose   albuterol (VENTOLIN HFA) 108 (90 Base) MCG/ACT inhaler Inhale 2 puffs into the lungs every 6 (six) hours as needed for wheezing or shortness of breath.      budesonide-formoterol (SYMBICORT) 160-4.5 MCG/ACT inhaler Inhale 2 puffs into the lungs 2 (two) times daily.      calcium carbonate (TUMS EX) 750 MG chewable tablet Chew 1,500 mg by mouth 2 (two) times daily as needed for heartburn.       cariprazine (VRAYLAR) 3 MG capsule Take 1 capsule (3 mg total) by mouth daily. 30 capsule     cyclobenzaprine (FLEXERIL) 10 MG tablet Take 10 mg by mouth at bedtime.      diclofenac Sodium (VOLTAREN) 1 % GEL Apply 2 g topically 4 (four) times daily. 100 g 0    escitalopram (LEXAPRO) 5 MG tablet Take 3 tablets (15 mg total) by mouth daily. 30 tablet 0    gabapentin (NEURONTIN) 300 MG capsule Take 1 capsule (300 mg total) by mouth 3 (three) times daily for 7 days. 21 capsule 0    hydrOXYzine (VISTARIL) 25 MG capsule Take 25 mg by mouth 4 (four) times daily as needed for anxiety.      ibuprofen (ADVIL) 200 MG tablet Take 600 mg by mouth daily as needed for moderate pain or headache.      Lidocaine-Menthol (ICY HOT MAX LIDOCAINE EX) Apply 1 Application topically daily as needed (pain).      Multiple Vitamins-Minerals (EMERGEN-C IMMUNE PO) Take 1 packet by mouth daily as needed (Immune support).      naproxen sodium (ALEVE) 220 MG tablet Take 220 mg by mouth 2 (two) times daily as needed (back pain).      pantoprazole (PROTONIX) 40 MG tablet TAKE 1 TABLET BY MOUTH DAILY (Patient taking differently: 40 mg daily.) 30 tablet 0    prazosin (MINIPRESS) 1 MG capsule Take 1 capsule (1 mg total) by mouth at bedtime for 7 days. 7 capsule 0    propranolol (INDERAL) 10 MG tablet Take 1 tablet (10 mg total) by mouth 2 (two) times daily.      QUEtiapine (SEROQUEL) 25 MG tablet Take 12.5-25 mg by mouth daily. Take one-half tablet (12.5 mg) in the morning and take one tablet (25 mg) at bedtime       Patient Stressors:    Patient Strengths:    Treatment Modalities: Medication Management, Group therapy, Case management,  1 to 1 session with clinician, Psychoeducation, Recreational therapy.   Physician Treatment Plan for Primary Diagnosis: Schizoaffective disorder, bipolar type ([place]) Long Term Goal(s): Improvement in symptoms so as ready for discharge   Short Term Goals: Ability to identify changes in  lifestyle to reduce recurrence of condition will improve Ability to verbalize feelings will improve Ability to disclose and discuss suicidal ideas Ability to demonstrate self-control will improve Ability to  identify and develop effective coping behaviors will improve Ability to maintain clinical measurements within normal limits will improve Compliance with prescribed medications will improve Ability to identify triggers associated with substance abuse/mental health issues will improve  Medication Management: Evaluate patient's response, side effects, and tolerance of medication regimen.  Therapeutic Interventions: 1 to 1 sessions, Unit Group sessions and Medication administration.  Evaluation of Outcomes: Progressing  Physician Treatment Plan for Secondary Diagnosis: Principal Problem:   Schizoaffective disorder, bipolar type ([place]) Active Problems:   Borderline personality disorder ([place])   Asperger syndrome   PTSD (post-traumatic stress disorder)   GAD (generalized anxiety disorder)   Suicide attempt ([place])   Fibromyalgia  Long Term Goal(s): Improvement in symptoms so as ready for discharge   Short Term Goals: Ability to identify changes in lifestyle to reduce recurrence of condition will improve Ability to verbalize feelings will improve Ability to disclose and discuss suicidal ideas Ability to demonstrate self-control will improve Ability to identify and develop effective coping behaviors will improve Ability to maintain clinical measurements within normal limits will improve Compliance with prescribed medications will improve Ability to identify triggers associated with substance abuse/mental health issues will improve     Medication Management: Evaluate patient's response, side effects, and tolerance of medication regimen.  Therapeutic Interventions: 1 to 1 sessions, Unit Group sessions and Medication administration.  Evaluation of Outcomes: Progressing   RN Treatment  Plan for Primary Diagnosis: Schizoaffective disorder, bipolar type ([place]) Long Term Goal(s): Knowledge of disease and therapeutic regimen to maintain health will improve  Short Term Goals: Ability to remain free from injury will improve, Ability to verbalize frustration and anger appropriately will improve, Ability to demonstrate self-control, Ability to participate in decision making will improve, Ability to verbalize feelings will improve, Ability to disclose and discuss suicidal ideas, Ability to identify and develop effective coping behaviors will improve, and Compliance with prescribed medications will improve  Medication Management: RN will administer medications as ordered by provider, will assess and evaluate patient's response and provide education to patient for prescribed medication. RN will report any adverse and/or side effects to prescribing provider.  Therapeutic Interventions: 1 on 1 counseling sessions, Psychoeducation, Medication administration, Evaluate responses to treatment, Monitor vital signs and CBGs as ordered, Perform/monitor CIWA, COWS, AIMS and Fall Risk screenings as ordered, Perform wound care treatments as ordered.  Evaluation of Outcomes: Progressing   LCSW Treatment Plan for Primary Diagnosis: Schizoaffective disorder, bipolar type ([place]) Long Term Goal(s): Safe transition to appropriate next level of care at discharge, Engage patient in therapeutic group addressing interpersonal concerns.  Short Term Goals: Engage patient in aftercare planning with referrals and resources, Increase social support, Increase ability to appropriately verbalize feelings, Increase emotional regulation, Facilitate acceptance of mental health diagnosis and concerns, Facilitate patient progression through stages of change regarding substance use diagnoses and concerns, Identify triggers associated with mental health/substance abuse issues, and Increase skills for wellness and  recovery  Therapeutic Interventions: Assess for all discharge needs, 1 to 1 time with Social worker, Explore available resources and support systems, Assess for adequacy in community support network, Educate family and significant other(s) on suicide prevention, Complete Psychosocial Assessment, Interpersonal group therapy.  Evaluation of Outcomes: Progressing   Progress in Treatment: Attending groups: No. Participating in groups: No. Taking medication as prescribed: Yes. Toleration medication: Yes. Family/Significant other contact made: No, will contact:  patient declines consents Patient understands diagnosis: No. Discussing patient identified problems/goals with staff: Yes. Medical problems stabilized or resolved: Yes. Denies suicidal/homicidal ideation:  Yes. Issues/concerns per patient self-inventory: No.  New problem(s) identified: No, Describe:  none reported   New Short Term/Long Term Goal(s):   medication stabilization, elimination of SI thoughts, development of comprehensive mental wellness plan.    Patient Goals:  Patient states, "I would like to work on coping skills"  Discharge Plan or Barriers: Patient recently admitted. CSW will continue to follow and assess for appropriate referrals and possible discharge planning.    Reason for Continuation of Hospitalization: Depression Medication stabilization Suicidal ideation  Estimated Length of Stay: 3-5 days  Last 3 [place] Suicide Severity Risk Score: [place] Admission (Current) from 10/06/2021 in [facility] ED from 10/04/2021 in [facility] from 09/27/2021 in [facility]  C-SSRS RISK CATEGORY No Risk High Risk No Risk       Last PHQ 2/9 Scores:    06/27/2021    3:18 AM 05/01/2021    2:16 AM 02/03/2020    2:24 AM  Depression screen PHQ 2/9  Decreased Interest '1 2 3  '$ Down, Depressed, Hopeless '2 3 3   '$ PHQ - 2 Score '3 5 6  '$ Altered sleeping 0 0 3  Tired, decreased energy '1 1 3  '$ Change in appetite 0 1 3  Feeling bad or failure about yourself  '1 3 3  '$ Trouble concentrating '2 3 3  '$ Moving slowly or fidgety/restless '1 1 3  '$ Suicidal thoughts '1 3 3  '$ PHQ-9 Score '9 17 27  '$ Difficult doing work/chores Somewhat difficult Extremely dIfficult     Scribe for Treatment Team: Zachery Conch, LCSW 10/09/2021 3:11 PM

## 2021-10-09 NOTE — BHH Group Notes (Signed)
Spiritual care group on grief and loss facilitated by chaplain Janne Napoleon, [facility]   Group Goal:   Support / Education around grief and loss   Members engage in facilitated group support and psycho-social education.   Group Description:   Following introductions and group rules, group members engaged in facilitated group dialog and support around topic of loss, with particular support around experiences of loss in their lives. Group Identified types of loss (relationships / self / things) and identified patterns, circumstances, and changes that precipitate losses. Reflected on thoughts / feelings around loss, normalized grief responses, and recognized variety in grief experience. Group noted Worden's four tasks of grief in discussion.   Group drew on Adlerian / Rogerian, narrative, MI,   Patient Progress: Margaret attended group and actively engaged in the group conversation.  She shared about some of her losses including the loss of her husband to suicide and the loss of custody of her daughter.  Her comments showed insight.  At times her comments were difficult to follow.  [address], [place] Pager, [phone]

## 2021-10-09 NOTE — Progress Notes (Signed)
Patient did not attend the evening speaker AA meeting.  

## 2021-10-09 NOTE — Progress Notes (Signed)
D:  Patient denied SI and HI, contracts for safety.  Denied  A/V hallucinations.  Patient denied pain. A:  Medications administered per MD orders.  Emotional support and encouragement given patient. R:  Safety maintained with 15 minute checks.  Patient looking forward to discharge tomorrow, Tuesday September 5.

## 2021-10-09 NOTE — Group Note (Signed)
Recreation Therapy Group Note   Group Topic:Stress Management  Group Date: 10/09/2021 Start Time: 0930 End Time: 0945 Facilitators: Victorino Sparrow, LRT,CTRS Location: [address] Dayroom   Goal Area(s) Addresses:  Patient will actively participate in stress management techniques presented during session.  Patient will successfully identify benefit of practicing stress management post d/c.   Group Description:  Guided Imagery. LRT provided education, instruction, and demonstration on practice of visualization via guided imagery. Patient was asked to participate in the technique introduced during session. LRT debriefed including topics of mindfulness, stress management and specific scenarios each patient could use these techniques.  Patients were focused on the activity until the doctor interrupted the flow and mood of the session.    Affect/Mood: N/A   Participation Level: Did not attend    Clinical Observations/Individualized Feedback:     Plan: Continue to engage patient in RT group sessions 2-3x/week.   Victorino Sparrow, LRT,CTRS  10/09/2021 10:39 AM

## 2021-10-09 NOTE — Plan of Care (Signed)
Nurse discussed anxiety, depression and coping skills with patient.  

## 2021-10-09 NOTE — Progress Notes (Signed)
[name] requested follow up after group. She shared about some of what she copes with including some of her diagnoses and she shared more about her losses.  Chaplain provided emotional and spiritual support as she shared.  [address], [place] Pager, [phone]

## 2021-10-09 NOTE — Progress Notes (Signed)
   10/08/21 1945  Psych Admission Type (Psych Patients Only)  Admission Status Voluntary  Psychosocial Assessment  Patient Complaints Anxiety;Worrying  Eye Contact Fair  Facial Expression Animated  Affect Anxious  Speech Logical/coherent  Interaction Intrusive;Needy  Motor Activity Fidgety  Appearance/Hygiene Unremarkable  Behavior Characteristics Anxious;Fidgety  Mood Anxious  Thought Process  Coherency WDL  Content Blaming others  Delusions None reported or observed  Perception WDL  Hallucination None reported or observed  Judgment Poor  Confusion None  Danger to Self  Current suicidal ideation? Denies  Danger to Others  Danger to Others None reported or observed

## 2021-10-09 NOTE — BHH Group Notes (Signed)
Adult Psychoeducational Group Note  Date:  10/09/2021 Time:  9:55 AM  Group Topic/Focus:  Goals Group:   The focus of this group is to help patients establish daily goals to achieve during treatment and discuss how the patient can incorporate goal setting into their daily lives to aide in recovery.  Participation Level:  Did Not Attend  Mella Inclan R Ryliegh Mcduffey 10/09/2021, 9:55 AM 

## 2021-10-09 NOTE — Progress Notes (Signed)
D:  Linna was mainly isolative to her room.  She did not attend evening AA group.  She denied SI/HI or AVH.  She denied any anxiety or depression but appeared anxious and fidgety.  She reported that she is excited about being discharged tomorrow to return to work and evict her ex-boyfriend from her residence.  She took her hs medications without difficulty.  No prns given.   A:  1:1 with RN for support and encouragement.  Medications given as ordered.  Q 15 minute checks maintained for safety.  Encouraged participation in group and unit activities.   R:  She is currently resting with her eyes closed and appears to be asleep.  She remains safe on the unit.      10/09/21 2114  Psych Admission Type (Psych Patients Only)  Admission Status Voluntary  Psychosocial Assessment  Patient Complaints Anxiety  Eye Contact Brief  Facial Expression Blank  Affect Flat  Speech Logical/coherent  Interaction Assertive;Intrusive  Motor Activity Fidgety  Appearance/Hygiene Disheveled  Behavior Characteristics Anxious;Fidgety  Mood Anxious  Thought Process  Coherency WDL  Content Blaming others  Delusions None reported or observed  Perception WDL  Hallucination None reported or observed  Judgment Poor  Confusion None  Danger to Self  Current suicidal ideation? Denies  Danger to Others  Danger to Others None reported or observed

## 2021-10-10 DIAGNOSIS — F250 Schizoaffective disorder, bipolar type: Principal | ICD-10-CM

## 2021-10-10 MED ORDER — FERROUS SULFATE 325 (65 FE) MG PO TABS: 325.0000 mg | ORAL_TABLET | ORAL | 0 refills | Status: DC

## 2021-10-10 NOTE — BHH Group Notes (Signed)
Adult Psychoeducational Group Note  Date:  10/10/2021 Time:  9:53 AM  Group Topic/Focus:  Goals Group:   The focus of this group is to help patients establish daily goals to achieve during treatment and discuss how the patient can incorporate goal setting into their daily lives to aide in recovery.  Participation Level:  Did Not Attend  [name] 10/10/2021, 9:53 AM 

## 2021-10-10 NOTE — Progress Notes (Addendum)
BHH/BMU LCSW Progress Note   10/10/2021    9:42 AM  [name]   [id number]   Type of Contact and Topic:    CSW attempted to obtain consent to reach collateral at this time. Patient declined to provide consent; per chart review, CSW Team and physician has attempted multiple times in the past.   CSW completed SPE with patient. Discussed potential triggers leading to suicidal ideation in addition to coping skills one might use in order to delay and distract self from self harming behaviors. CSW encouraged patient to utilize emergency services if they felt unable to maintain their safety. SPE flyer provided to patient at this time.    Signed:  Durenda Hurt, MSW, LCSWA, LCAS 10/10/2021 9:42 AM

## 2021-10-10 NOTE — Progress Notes (Signed)
Discharge Note:  Patient discharged.  Patient to call [facility] for transport to her home.  Patient denied SI and HI.  Denied A/V.  Suicide prevention information given and discussed with patient who stated she understood and had no questions.  Patient stated she received all her belongings, clothes, toiletries, misc items, etc.  Patient stated she appreciated all assistance received from [facility] staff.  All required discharge information given.

## 2021-10-10 NOTE — Plan of Care (Signed)
Nurse discussed coping skills with patient.  

## 2021-10-10 NOTE — Discharge Summary (Signed)
Physician Discharge Summary Note  Patient:  [name] is an 31 y.o., female MRN:  [id number] DOB:  29-Feb-1990 Patient phone:  [phone] (home)  Patient address:   [place] [address],  Total Time spent with patient: 45 minutes  Date of Admission:  10/06/2021 Date of Discharge: 10/10/2021  Reason for Admission:  [name] is a 31 year old female with a past psychiatric history of schizoaffective disorder bipolar type, anxiety, Asperger's, PTSD, and borderline personality disorder, and a medical history of fibromyalgia, tachycardia, and GERD who presented under IVC after a suicide attempt in which she took a week's worth of all of her prescribed medications.Patient has had multiple inpatient psychiatric admissions, with the last June 2023 at [facility] for a suicide attempt in which she overdosed on a 7-day (or 14) supply of all of her medications.  Principal Problem: Schizoaffective disorder, bipolar type [facility]) Discharge Diagnoses: Principal Problem:   Schizoaffective disorder, bipolar type ([place]) Active Problems:   Borderline personality disorder ([place])   Asperger syndrome   PTSD (post-traumatic stress disorder)   GAD (generalized anxiety disorder)   Suicide attempt ([place])   Fibromyalgia    Past Psychiatric History:  Previous Psych Diagnoses: schizoaffective disorder bipolar type, anxiety, Asperger's, PTSD, and borderline personality disorder Prior inpatient treatment: Multiple, over 20 inpatient admissions over the course of her lifetime, 4-5 admissions this year alone Prior outpatient treatment: Has CST team through [facility] Prior rehab hx: Denies Psychotherapy hx: Currently sees [name], from [place] support team History of suicide: Multiple (does not describe) History of homicide: Denies Psychiatric medication history: In addition to current medications, previously trialed Tegretol (reports throwing up blood), Haldol, Clozaril,  Depakote, Invega, and Lamictal (all of which she says were futile) Psychiatric medication compliance history: Reports compliance Neuromodulation history: Denies H/o PHP: denies Current Psychiatrist: Tula Ward Current therapist: Warden/ranger  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 fragile X syndrome    Chronic constipation    Depression    Drug-seeking behavior    Essential tremor    Headache    Overdose of acetaminophen 07/2017   and other meds   Personality disorder ([place])    Schizo-affective psychosis ([place])    Schizoaffective disorder, bipolar type ([place])    Seizures ([place])    Last seizure December 2017   Sleep apnea     Past Surgical History:  Procedure Laterality Date   MOUTH SURGERY  2009 or 2010   Family History:  Family History  Problem Relation Age of Onset   Mental illness Father    Asthma Father    PDD Brother    Seizures Brother    Family History: Medical: Mom-skin cancer, maternal grandpa-type 2 diabetes and heart disease, maternal grandma-thyroid and heart problems, dad-COPD, brother-asthma Psych: Dad-depression, brother, bipolar 1 disorder Psych Rx: Unsure SA/HA: 2 paternal great uncles completed suicide as well as ex-husband Substance use family hx:  paternal uncle-alcohol use disorder  Social History:  Social History   Substance and Sexual Activity  Alcohol Use No   Alcohol/week: 1.0 standard drink of alcohol   Types: 1 Standard drinks or equivalent per week   Comment: denies at this time     Social History   Substance and Sexual Activity  Drug Use No   Comment: History of cocaine use at age  18 for 4 months    Social History   Socioeconomic History   Marital status: Widowed    Spouse name: Not on file   Number of children: 0   Years of education: Not on file   Highest education level: Not on file  Occupational  History   Occupation: disability  Tobacco Use   Smoking status: Former    Packs/day: 0.00    Types: Cigarettes   Smokeless tobacco: Never   Tobacco comments:    Smoked for 2  years age [age]  Vaping Use   Vaping Use: Never used  Substance and Sexual Activity   Alcohol use: No    Alcohol/week: 1.0 standard drink of alcohol    Types: 1 Standard drinks or equivalent per week    Comment: denies at this time   Drug use: No    Comment: History of cocaine use at age 7 for 4 months   Sexual activity: Not Currently    Birth control/protection: None  Other Topics Concern   Not on file  Social History Narrative   Marital status: Widowed      Children: daughter      Lives: with boyfriend, in two story home      Employment:  Disability      Tobacco: quit smoking; smoked for two years.      Alcohol ;none      Drugs: none   Has not traveled outside of the country.   Right handed         Social Determinants of Health   Financial Resource Strain: Not on file  Food Insecurity: Not on file  Transportation Needs: Not on file  Physical Activity: Not on file  Stress: Not on file  Social Connections: Not on file    Hospital Course:   During the patient's hospitalization, patient had extensive initial psychiatric evaluation, and follow-up psychiatric evaluations every day.   Psychiatric diagnoses provided upon initial assessment: # Schizoaffective disorder, bipolar type # Borderline personality disorder # Asperger syndrome # PTSD # GAD   Patient's psychiatric medications were adjusted on admission:  -Resume home Vraylar at 1.5 mg daily; will titrate to home dose of 3 mg during this admission -Resume home Lexapro 10 mg daily; will titrate her home dose of 15 mg during this admission -Resume home Seroquel 12.5 mg every morning and 25 mg nightly -Resume home prazosin 1 mg nightly - Continue home gabapentin 300 mg 3 times daily   During the hospitalization, other adjustments were  made to the patient's psychiatric medication regimen:  - Increase to home Vraylar 3 mg dose - Increase to home Lexapro 15 mg daily dose - Continue home Seroquel 12.5 mg every morning and 25 mg nightly - Continue home prazosin 1 mg nightly - Continue home gabapentin 300 mg 3 times daily - Continue home propranolol 10 mg twice daily     Gradually, patient started adjusting to milieu.   Patient's care was discussed during the interdisciplinary team meeting every day during the hospitalization.   The patient denies having side effects to prescribed psychiatric medication.   The patient reports their target psychiatric symptoms of depression and anxiety responded well to the psychiatric medications, and the patient reports overall benefit other psychiatric hospitalization. Supportive psychotherapy was provided to the patient. The patient also participated in regular group therapy while admitted.    Labs were reviewed with the patient, and abnormal results were discussed with the patient.   The patient denied having suicidal thoughts  more than 48 hours prior to discharge.  Patient denies having homicidal thoughts.  Patient denies having auditory hallucinations.  Patient denies any visual hallucinations.  Patient denies having paranoid thoughts.   The patient is able to verbalize their individual safety plan to this provider.   Patient has minimal social support so safety planning was unable to be made with a family member or friend. However, patient is able to contract for safety and patient has denied SI/HI throughout hospitalization. Patient adamantly reports that she regrets her overdose and states that it was not an attempt to end her life.  It is recommended to the patient to continue psychiatric medications as prescribed, after discharge from the hospital.     It is recommended to the patient to follow up with your outpatient psychiatric provider and PCP.   Discussed with the patient, the  impact of alcohol, drugs, tobacco have been there overall psychiatric and medical wellbeing, and total abstinence from substance use was recommended the patient.  Physical Findings:  Musculoskeletal: Strength & Muscle Tone: within normal limits Gait & Station: normal Patient leans: N/A   Psychiatric Specialty Exam:  Presentation  General Appearance: Appropriate for Environment; Casual   Eye Contact:Good   Speech:Clear and Coherent; Normal Rate   Speech Volume:Normal   Handedness:Right    Mood and Affect  Mood:Euthymic   Affect:Appropriate; Congruent    Thought Process  Thought Processes:Goal Directed   Descriptions of Associations:Intact   Orientation:Full (Time, Place and Person)   Thought Content:Logical   History of Schizophrenia/Schizoaffective disorder:No   Duration of Psychotic Symptoms:N/A   Hallucinations:Hallucinations: None   Ideas of Reference:None   Suicidal Thoughts:Suicidal Thoughts: No   Homicidal Thoughts:Homicidal Thoughts: No    Sensorium  Memory:Immediate Good; Recent Good; Remote Good   Judgment:Fair   Insight:Fair    Executive Functions  Concentration:Good   Attention Span:Good   [place] of Knowledge:Good   Language:Good    Psychomotor Activity  Psychomotor Activity:Psychomotor Activity: Normal    Assets  Assets:Communication Skills; Desire for Improvement; Financial Resources/Insurance; Housing; Social Support    Sleep  Sleep:Sleep: Good     Physical Exam: Physical Exam Review of Systems  Respiratory:  Negative for shortness of breath.   Cardiovascular:  Negative for chest pain.  Gastrointestinal:  Negative for abdominal pain, constipation, diarrhea, heartburn, nausea and vomiting.  Neurological:  Negative for headaches.   Blood pressure 106/66, pulse (!) 102, temperature 98 F (36.7 C), temperature source Oral, resp. rate 18, height '5\' 5"'$  (1.651 m), weight 89.8  kg, SpO2 100 %. Body mass index is 32.95 kg/m.   Social History   Tobacco Use  Smoking Status Former   Packs/day: 0.00   Types: Cigarettes  Smokeless Tobacco Never  Tobacco Comments   Smoked for 2  years age [age]   Tobacco Cessation:  N/A, patient does not currently use tobacco products   Blood Alcohol level:  Lab Results  Component Value Date   ETH <10 10/04/2021   ETH <10 11/21/5100    Metabolic Disorder Labs:  Lab Results  Component Value Date   HGBA1C 5.0 10/07/2021   MPG 96.8 10/07/2021   MPG 96.8 09/02/2021   Lab Results  Component Value Date   PROLACTIN 66.2 (H) 03/02/2021   PROLACTIN 66.1 (H) 02/04/2020   Lab Results  Component Value Date   CHOL 148 10/07/2021   TRIG 173 (H) 10/07/2021   HDL 39 (L) 10/07/2021   CHOLHDL 3.8 10/07/2021   VLDL 35 10/07/2021  [place] 74 10/07/2021   LDLCALC 165 (H) 05/01/2021    See Psychiatric Specialty Exam and Suicide Risk Assessment completed by Attending Physician prior to discharge.  Discharge destination:  Home  Is patient on multiple antipsychotic therapies at discharge:  Yes,   Do you recommend tapering to monotherapy for antipsychotics?  Yes   Has Patient had three or more failed trials of antipsychotic monotherapy by history:  No  Recommended Plan for Multiple Antipsychotic Therapies: Recommend tapering seroquel if feasible as it is on a low dose and primarily used for sleep. However, will leave this to the discretion of Dr. Clovis Pu whom patient sees regularly.    Allergies as of 10/10/2021       Reactions   Bee Venom Anaphylaxis   Coconut (cocos Nucifera) Anaphylaxis, Rash   Coconut Flavor Anaphylaxis, Rash   Fish Allergy Anaphylaxis   Geodon [ziprasidone Hcl] Other (See Comments)   Pt states that this medication causes paralysis of the mouth.     Haloperidol And Related Other (See Comments)   Pt states that this medication causes paralysis of the mouth, jaw locks up   Lithobid [lithium] Other (See  Comments)   Seizure-like activity   Roxicodone [oxycodone] Other (See Comments)   Hallucinations    Seroquel [quetiapine] Other (See Comments)   Severe drowsiness Pt currently taking 50 mg, she is ok with this strength, she does not want higher dose.   Shellfish Allergy Anaphylaxis   Phenergan [promethazine Hcl] Other (See Comments)   Chest pain    Prilosec [omeprazole] Nausea And Vomiting   Sulfa Antibiotics Other (See Comments)   Chest pain    Tegretol [carbamazepine] Nausea And Vomiting   Prozac [fluoxetine] Other (See Comments)   Increased Depression Suicidal thoughts   Tape Other (See Comments)   Skin tears, can only tolerate paper tape.   Tylenol [acetaminophen] Other (See Comments)   Unknown reaction        Medication List     STOP taking these medications    naproxen sodium 220 MG tablet Commonly known as: ALEVE       TAKE these medications      Indication  albuterol 108 (90 Base) MCG/ACT inhaler Commonly known as: VENTOLIN HFA Inhale 2 puffs into the lungs every 6 (six) hours as needed for wheezing or shortness of breath.  Indication: Asthma   budesonide-formoterol 160-4.5 MCG/ACT inhaler Commonly known as: SYMBICORT Inhale 2 puffs into the lungs 2 (two) times daily.  Indication: Asthma   calcium carbonate 750 MG chewable tablet Commonly known as: TUMS EX Chew 1,500 mg by mouth 2 (two) times daily as needed for heartburn.  Indication: Heartburn   cariprazine 3 MG capsule Commonly known as: VRAYLAR Take 1 capsule (3 mg total) by mouth daily.  Indication: MIXED BIPOLAR AFFECTIVE DISORDER   cyclobenzaprine 10 MG tablet Commonly known as: FLEXERIL Take 10 mg by mouth at bedtime.  Indication: Fibromyalgia Syndrome   diclofenac Sodium 1 % Gel Commonly known as: Voltaren Apply 2 g topically 4 (four) times daily.  Indication: Fibromyalgia   EMERGEN-C IMMUNE PO Take 1 packet by mouth daily as needed (Immune support).  Indication: Nutritional  Support   escitalopram 5 MG tablet Commonly known as: LEXAPRO Take 3 tablets (15 mg total) by mouth daily.  Indication: Generalized Anxiety Disorder   ferrous sulfate 325 (65 FE) MG tablet Take 1 tablet (325 mg total) by mouth every other day. Start taking on: October 11, 2021  Indication: Iron Deficiency   gabapentin  300 MG capsule Commonly known as: NEURONTIN Take 1 capsule (300 mg total) by mouth 3 (three) times daily for 7 days.  Indication: Generalized Anxiety Disorder   hydrOXYzine 25 MG capsule Commonly known as: VISTARIL Take 25 mg by mouth 4 (four) times daily as needed for anxiety.  Indication: Feeling Anxious   ibuprofen 200 MG tablet Commonly known as: ADVIL Take 600 mg by mouth daily as needed for moderate pain or headache.  Indication: Muscle Pain   ICY HOT MAX LIDOCAINE EX Apply 1 Application topically daily as needed (pain).  Indication: pain   pantoprazole 40 MG tablet Commonly known as: PROTONIX TAKE 1 TABLET BY MOUTH DAILY What changed: how to take this  Indication: Gastroesophageal Reflux Disease   prazosin 1 MG capsule Commonly known as: MINIPRESS Take 1 capsule (1 mg total) by mouth at bedtime for 7 days.  Indication: Frightening Dreams   propranolol 10 MG tablet Commonly known as: INDERAL Take 1 tablet (10 mg total) by mouth 2 (two) times daily.  Indication: Feeling Anxious   QUEtiapine 25 MG tablet Commonly known as: SEROQUEL Take 12.5-25 mg by mouth daily. Take one-half tablet (12.5 mg) in the morning and take one tablet (25 mg) at bedtime  Indication: Posttraumatic Stress Disorder        Follow-up Information     [facility] Follow up.   Why: Call [place] Please call your CST Team and inform them of your discharge from the hospital. You can also reach [name], therapist, at [phone] Contact information: [place] [address] [place], [place] Follow up.   Why:  Follow up with [facility] information: [place] [address] [phone]                  Follow-up recommendations:   Activity:  as tolerated Diet:  heart healthy   Comments:  Prescriptions were given at discharge.  Patient is agreeable with the discharge plan.  Patient was given an opportunity to ask questions.  Patient appears to feel comfortable with discharge and denies any current suicidal or homicidal thoughts.    Patient is instructed prior to discharge to: Take all medications as prescribed by mental healthcare provider. Report any adverse effects and or reactions from the medicines to outpatient provider promptly. In the event of worsening symptoms, patient is instructed to call the crisis hotline, 911 and or go to the nearest ED for appropriate evaluation and treatment of symptoms. Patient is to follow-up with primary care provider for other medical issues, concerns and or health care needs.   Signed: France Ravens, MD 10/10/2021, 10:39 AM

## 2021-10-10 NOTE — Progress Notes (Signed)
  [facility] Adult Case Management Discharge Plan :  Will you be returning to the same living situation after discharge:  Yes,  Patient to return to place of residence.  At discharge, do you have transportation home?: Yes,  Patient has requested to call for Mel[facility] Popper; patient to discharge to lobby.  Do you have the ability to pay for your medications: Yes,  [employer]   Release of information consent forms completed and in the chart;  Patient's signature needed at discharge.  Patient to Follow up at:  Follow-up Information     [facility] Follow up.   Why: Call [place] Please call your CST Team and inform them of your discharge from the hospital. You can also reach Butch Penny, therapist, at [phone] Contact information: [place] [address] [place], [place] Follow up.   Why: Follow up with [facility] information: [place] [address] [phone]                 Next level of care provider has access to [place] and Suicide Prevention discussed: Yes,  SPE completed with patient, patient has declined consent at this time to reach collateral.  CSW completed SPE with patient. Discussed potential triggers leading to suicidal ideation in addition to coping skills one might use in order to delay and distract self from self harming behaviors. CSW encouraged patient to utilize emergency services if they felt unable to maintain their safety. SPE flyer provided to patient at this time.     Has patient been referred to the Quitline?: N/A patient is not a smoker Tobacco Use: Medium Risk (10/06/2021)   Patient History    Smoking Tobacco Use: Former    Smokeless Tobacco Use: Never    Passive Exposure: Not on file   Patient has been referred for addiction treatment: N/A. Patient denies active substance use, UDS Negative for all, screened low risk during nursing  admission (see [facility] quick tab).  Social History   Substance and Sexual Activity  Drug Use No   Comment: History of cocaine use at age 16 for 4 months   Social History   Substance and Sexual Activity  Alcohol Use No   Alcohol/week: 1.0 standard drink of alcohol   Types: 1 Standard drinks or equivalent per week   Comment: denies at this time   Durenda Hurt, Latanya Presser 10/10/2021, 9:56 AM

## 2021-10-10 NOTE — [facility] Suicide Risk Assessment (Signed)
[facility] Discharge Suicide Risk Assessment   Principal Problem: Schizoaffective disorder, bipolar type [facility]) Discharge Diagnoses: Principal Problem:   Schizoaffective disorder, bipolar type ([place]) Active Problems:   Borderline personality disorder ([place])   Asperger syndrome   PTSD (post-traumatic stress disorder)   GAD (generalized anxiety disorder)   Suicide attempt ([place])   Fibromyalgia   Reason for Admission: Medication Overdose  Hospital Summary During the patient's hospitalization, patient had extensive initial psychiatric evaluation, and follow-up psychiatric evaluations every day.  Psychiatric diagnoses provided upon initial assessment: # Schizoaffective disorder, bipolar type # Borderline personality disorder # Asperger syndrome # PTSD # GAD  Patient's psychiatric medications were adjusted on admission:  -Resume home Vraylar at 1.5 mg daily; will titrate to home dose of 3 mg during this admission -Resume home Lexapro 10 mg daily; will titrate her home dose of 15 mg during this admission -Resume home Seroquel 12.5 mg every morning and 25 mg nightly -Resume home prazosin 1 mg nightly - Continue home gabapentin 300 mg 3 times daily  During the hospitalization, other adjustments were made to the patient's psychiatric medication regimen:  - Increase to home Vraylar 3 mg dose - Increase to home Lexapro 15 mg daily dose - Continue home Seroquel 12.5 mg every morning and 25 mg nightly - Continue home prazosin 1 mg nightly - Continue home gabapentin 300 mg 3 times daily - Continue home propranolol 10 mg twice daily   Gradually, patient started adjusting to milieu.   Patient's care was discussed during the interdisciplinary team meeting every day during the hospitalization.  The patient denies having side effects to prescribed psychiatric medication.  The patient reports their target psychiatric symptoms of depression and anxiety responded well to the psychiatric medications,  and the patient reports overall benefit other psychiatric hospitalization. Supportive psychotherapy was provided to the patient. The patient also participated in regular group therapy while admitted.   Labs were reviewed with the patient, and abnormal results were discussed with the patient.  The patient denied having suicidal thoughts more than 48 hours prior to discharge.  Patient denies having homicidal thoughts.  Patient denies having auditory hallucinations.  Patient denies any visual hallucinations.  Patient denies having paranoid thoughts.  The patient is able to verbalize their individual safety plan to this provider.  It is recommended to the patient to continue psychiatric medications as prescribed, after discharge from the hospital.    It is recommended to the patient to follow up with your outpatient psychiatric provider and PCP.  Discussed with the patient, the impact of alcohol, drugs, tobacco have been there overall psychiatric and medical wellbeing, and total abstinence from substance use was recommended the patient.   Total Time spent with patient: 45 minutes  Musculoskeletal: Strength & Muscle Tone: within normal limits Gait & Station: normal Patient leans: N/A  Psychiatric Specialty Exam  Presentation  General Appearance: Appropriate for Environment; Casual   Eye Contact:Good   Speech:Clear and Coherent; Normal Rate   Speech Volume:Normal   Handedness:Right    Mood and Affect  Mood:Euthymic   Duration of Depression Symptoms: Greater than two weeks   Affect:Appropriate; Congruent    Thought Process  Thought Processes:Goal Directed   Descriptions of Associations:Intact   Orientation:Full (Time, Place and Person)   Thought Content:Logical   History of Schizophrenia/Schizoaffective disorder:No   Duration of Psychotic Symptoms:N/A   Hallucinations:Hallucinations: None  Ideas of Reference:None   Suicidal Thoughts:Suicidal  Thoughts: No  Homicidal Thoughts:Homicidal Thoughts: No   Sensorium  Memory:Immediate Good; Recent  Good; Remote Good   Judgment:Fair   Insight:Fair    Executive Functions  Concentration:Good   Attention Span:Good   Recall:Good   Fund of Knowledge:Good   Language:Good    Psychomotor Activity  Psychomotor Activity:Psychomotor Activity: Normal   Assets  Assets:Communication Skills; Desire for Improvement; Financial Resources/Insurance; Housing; Social Support    Sleep  Sleep:Sleep: Good   Physical Exam: Physical Exam Review of Systems  Respiratory:  Negative for shortness of breath.   Cardiovascular:  Negative for chest pain.  Gastrointestinal:  Negative for abdominal pain, constipation, diarrhea, heartburn, nausea and vomiting.  Neurological:  Negative for headaches.   Blood pressure 106/66, pulse (!) 102, temperature 98 F (36.7 C), temperature source Oral, resp. rate 18, height '5\' 5"'$  (1.651 m), weight 89.8 kg, SpO2 100 %. Body mass index is 32.95 kg/m.  Mental Status Per Nursing Assessment::   On Admission:  Suicidal ideation indicated by patient  Demographic Factors:  Caucasian and Living alone  Loss Factors: NA  Historical Factors: Prior suicide attempts and Impulsivity  Risk Reduction Factors:   Employed, Positive therapeutic relationship, and Positive coping skills or problem solving skills  Continued Clinical Symptoms:  Severe Anxiety and/or Agitation More than one psychiatric diagnosis Previous Psychiatric Diagnoses and Treatments  Cognitive Features That Contribute To Risk:  None    Suicide Risk:  Moderate:  Frequent suicidal ideation with limited intensity, and duration, some specificity in terms of plans, no associated intent, good self-control, limited dysphoria/symptomatology, some risk factors present, and identifiable protective factors, including available and accessible social support.   Follow-up Information      [facility] Follow up.   Why: Call [place] Please call your CST Team and inform them of your discharge from the hospital. You can also reach Butch Penny, therapist, at [phone] Contact information: [place] [address] [place], [place] Follow up.   Why: Follow up with [facility] information: [place] [address] [phone]                 Plan Of Care/Follow-up recommendations:  Activity: as tolerated  Diet: heart healthy  Other: -Follow-up with your outpatient psychiatric provider -instructions on appointment date, time, and address (location) are provided to you in discharge paperwork.  -Take your psychiatric medications as prescribed at discharge - instructions are provided to you in the discharge paperwork  -Testing: Follow-up with outpatient provider for abnormal lab results: Fasting Lipid panel, low ferritin  -Recommend abstinence from alcohol, tobacco, and other illicit drug use at discharge.   -If your psychiatric symptoms recur, worsen, or if you have side effects to your psychiatric medications, call your outpatient psychiatric provider, [phone] or go to the nearest emergency department.  -If suicidal thoughts recur, call your outpatient psychiatric provider, 911, [phone] or go to the nearest emergency department.   France Ravens, MD 10/10/2021, 10:39 AM

## 2021-10-10 NOTE — Progress Notes (Signed)
D"  Patient's self inventory sheet, patient sleeps good, sleep medication helpful.  Good appetite, low energy level, good concentration.  Denied depression, denied hopeless, anxiety 3.  Denied withdrawals.  Denied SI  Pain medicine helpful.  Goal is discharge.  Plans to talk to MD.  Does have discharge plans.  Plans to talk to MD.   A:  Medications administered per MD orders.  Emotional support and encouragement given pain. R:  Denied SI and HI, contracts for  safety.  Denied A/V hallucinations.    Patient is hopeful for discharge today.  Has to be a work today.

## 2021-10-11 ENCOUNTER — Emergency Department (HOSPITAL_COMMUNITY)
Admission: EM | Admit: 2021-10-11 | Discharge: 2021-10-14 | Disposition: A | Discharge status: Home / Self Care | Payer: PPO | Attending: Emergency Medicine | Admitting: Emergency Medicine

## 2021-10-11 ENCOUNTER — Other Ambulatory Visit: Payer: Self-pay

## 2021-10-11 ENCOUNTER — Encounter (HOSPITAL_COMMUNITY): Payer: Self-pay | Admitting: Emergency Medicine

## 2021-10-11 DIAGNOSIS — F603 Borderline personality disorder: Secondary | ICD-10-CM | POA: Diagnosis not present

## 2021-10-11 DIAGNOSIS — F319 Bipolar disorder, unspecified: Secondary | ICD-10-CM | POA: Diagnosis present

## 2021-10-11 DIAGNOSIS — X58XXXA Exposure to other specified factors, initial encounter: Secondary | ICD-10-CM | POA: Insufficient documentation

## 2021-10-11 DIAGNOSIS — F315 Bipolar disorder, current episode depressed, severe, with psychotic features: Secondary | ICD-10-CM | POA: Diagnosis not present

## 2021-10-11 DIAGNOSIS — X789XXA Intentional self-harm by unspecified sharp object, initial encounter: Secondary | ICD-10-CM

## 2021-10-11 DIAGNOSIS — R45851 Suicidal ideations: Secondary | ICD-10-CM

## 2021-10-11 DIAGNOSIS — T50901A Poisoning by unspecified drugs, medicaments and biological substances, accidental (unintentional), initial encounter: Secondary | ICD-10-CM | POA: Diagnosis present

## 2021-10-11 DIAGNOSIS — F845 Asperger's syndrome: Secondary | ICD-10-CM | POA: Diagnosis present

## 2021-10-11 DIAGNOSIS — Y900 Blood alcohol level of less than 20 mg/100 ml: Secondary | ICD-10-CM | POA: Diagnosis not present

## 2021-10-11 DIAGNOSIS — Z20822 Contact with and (suspected) exposure to covid-19: Secondary | ICD-10-CM | POA: Insufficient documentation

## 2021-10-11 DIAGNOSIS — F314 Bipolar disorder, current episode depressed, severe, without psychotic features: Secondary | ICD-10-CM | POA: Diagnosis present

## 2021-10-11 DIAGNOSIS — F250 Schizoaffective disorder, bipolar type: Secondary | ICD-10-CM | POA: Diagnosis present

## 2021-10-11 DIAGNOSIS — R001 Bradycardia, unspecified: Secondary | ICD-10-CM | POA: Diagnosis not present

## 2021-10-11 DIAGNOSIS — R9431 Abnormal electrocardiogram [ECG] [EKG]: Secondary | ICD-10-CM | POA: Diagnosis not present

## 2021-10-11 DIAGNOSIS — T1491XA Suicide attempt, initial encounter: Secondary | ICD-10-CM | POA: Insufficient documentation

## 2021-10-11 DIAGNOSIS — F4325 Adjustment disorder with mixed disturbance of emotions and conduct: Secondary | ICD-10-CM | POA: Diagnosis not present

## 2021-10-11 DIAGNOSIS — F32A Depression, unspecified: Secondary | ICD-10-CM | POA: Diagnosis not present

## 2021-10-11 DIAGNOSIS — Z79899 Other long term (current) drug therapy: Secondary | ICD-10-CM | POA: Diagnosis not present

## 2021-10-11 DIAGNOSIS — Z046 Encounter for general psychiatric examination, requested by authority: Secondary | ICD-10-CM | POA: Diagnosis present

## 2021-10-11 DIAGNOSIS — Z7289 Other problems related to lifestyle: Secondary | ICD-10-CM

## 2021-10-11 LAB — RESP PANEL BY RT-PCR (FLU A&B, COVID) ARPGX2
Influenza A by PCR: NEGATIVE
Influenza B by PCR: NEGATIVE
SARS Coronavirus 2 by RT PCR: NEGATIVE

## 2021-10-11 LAB — RAPID URINE DRUG SCREEN, HOSP PERFORMED
Amphetamines: NOT DETECTED
Barbiturates: NOT DETECTED
Benzodiazepines: NOT DETECTED
Cocaine: NOT DETECTED
Opiates: NOT DETECTED
Tetrahydrocannabinol: NOT DETECTED

## 2021-10-11 MED ORDER — CYCLOBENZAPRINE HCL 10 MG PO TABS
10.0000 mg | ORAL_TABLET | Freq: Once | ORAL | Status: AC
  Administered 2021-10-11: 10 mg via ORAL
  Filled 2021-10-11: qty 1

## 2021-10-11 MED ORDER — CARIPRAZINE HCL 1.5 MG PO CAPS
3.0000 mg | ORAL_CAPSULE | Freq: Every day | ORAL | Status: DC
  Administered 2021-10-11 – 2021-10-14 (×3): 3 mg via ORAL
  Filled 2021-10-11 (×5): qty 2

## 2021-10-11 MED ORDER — GABAPENTIN 300 MG PO CAPS
300.0000 mg | ORAL_CAPSULE | Freq: Three times a day (TID) | ORAL | Status: DC
  Administered 2021-10-11 – 2021-10-14 (×10): 300 mg via ORAL
  Filled 2021-10-11 (×10): qty 1

## 2021-10-11 MED ORDER — QUETIAPINE FUMARATE 25 MG PO TABS
25.0000 mg | ORAL_TABLET | Freq: Every day | ORAL | Status: DC
  Administered 2021-10-11 – 2021-10-13 (×3): 25 mg via ORAL
  Filled 2021-10-11 (×3): qty 1

## 2021-10-11 MED ORDER — QUETIAPINE FUMARATE 25 MG PO TABS
12.5000 mg | ORAL_TABLET | Freq: Every day | ORAL | Status: DC
  Administered 2021-10-11 – 2021-10-14 (×4): 12.5 mg via ORAL
  Filled 2021-10-11 (×4): qty 1

## 2021-10-11 MED ORDER — PRAZOSIN HCL 1 MG PO CAPS
1.0000 mg | ORAL_CAPSULE | Freq: Every day | ORAL | Status: DC
  Administered 2021-10-11 – 2021-10-13 (×3): 1 mg via ORAL
  Filled 2021-10-11 (×6): qty 1

## 2021-10-11 MED ORDER — QUETIAPINE FUMARATE 25 MG PO TABS: 12.5000 mg | ORAL_TABLET | Freq: Every day | ORAL | Status: DC

## 2021-10-11 MED ORDER — LIDOCAINE 5 % EX PTCH
1.0000 | MEDICATED_PATCH | CUTANEOUS | Status: DC
  Administered 2021-10-11 – 2021-10-13 (×3): 1 via TRANSDERMAL
  Filled 2021-10-11 (×7): qty 1

## 2021-10-11 MED ORDER — ESCITALOPRAM OXALATE 10 MG PO TABS
15.0000 mg | ORAL_TABLET | Freq: Every day | ORAL | Status: DC
  Administered 2021-10-11 – 2021-10-14 (×4): 15 mg via ORAL
  Filled 2021-10-11 (×4): qty 2
  Filled 2021-10-11 (×2): qty 1.5

## 2021-10-11 NOTE — ED Notes (Signed)
Pt arrived to room 50 with dsy on Lt forearm . DSY is clean ,dry and intact.

## 2021-10-11 NOTE — ED Notes (Signed)
Patient taking shower at this time.  Patient given 2 towels, 3 washcloths, new scrubs, socks, and hygiene items.  Bandage removed from arm.

## 2021-10-11 NOTE — Consult Note (Signed)
Telepsych Consultation   Reason for Consult:  Psychiatric Reassessment for suicide attempts Referring Physician:  Anda Kraft, PA-C Location of Patient:    [name] Location of Provider: Other: virtual home office  Patient Identification: [name] MRN:  [id number] Principal Diagnosis: Suicide attempt by cutting of wrist [facility]) Diagnosis:  Principal Problem:   Suicide attempt by cutting of wrist ([name]) Active Problems:   Borderline personality disorder ([place])   Asperger syndrome   Overdose   Adjustment disorder with mixed disturbance of emotions and conduct   Bipolar I disorder, current or most recent episode depressed, with psychotic features ([name])   Self-injurious behavior   Bipolar 1 disorder, depressed, severe ([name])   Total Time spent with patient: 30 minutes  Subjective:   [name] is a 31 y.o. female patient with history of Asperger's syndrome, PTSD, anxiety, schizoaffective bipolar type and borderline personality disorder. Pt admitted with suicidal behaviors, pt cut her left wrist as suicide attempt. Pt has hx of multiple suicide attempts and subsequent inpatient psychiatric admissions, including most recent August 2023 via overdose on prescribed medications;  pt was discharged Sept 5th. June 2023 at [facility] for suicide attempts for a suicide via pill overdose.   HPI:  Patient seen via telepsych by this provider; chart reviewed and consulted with Dr. Dwyane Dee on 10/11/21.  On evaluation [name] reports she is depressed, feels hopeless and has no reason to go on.  States she was recently discharged from mental health inpatient stay on 9/5, was feeling better at the time of discharge and has suicidal ideations but no plan or intent to harm herself.  However, pt states she encountered problems getting her medications refilled, and had difficulty sleeping. Pt reports sleep concerns triggered suicidal ideations, she also continues to report  ongoing relationship problems with her boyfriend.  States she tried to use coping skills, tried to paint, and read but none these things worked so she decided to cut her wrist.  Pt's left wrist is covered with gauze, dressing appears clean dry and intact.  Per admission notes, appears there was minimal bleeding and no needs for sutures or surgical interventions to affected area. Pt also states she is tired of trying to kill herself and living, "I just want to be done with it."  Pt is chronically suicidal. In lieu of several suicide attempts, discussed with patient transitioning to different living environment in which she does not have access to her medications or items she can use for self harm.  She does not like this option but it was explained safety parameters will have to be put in place to ensure her safety once she's discharged to outpatient care. Pt with history of Asperger's syndrome, PTSD, anxiety, schizoaffective bipolar type and borderline personality disorder.   Per ED Provider Admission Assessment 10/11/2021'@0324'$ : Chief Complaint  Patient presents with   Suicidal      [name] is a 31 y.o. female.   HPI    Patient with medical history including depression, autism, personality disorder, schizophrenia, presents with complaints of suicidal ideations.  Patient states that she is feeling depressed, she states that she has been unable to take her medicine as she could not pick it up yesterday and she does not have motivation to get it today, she states that because of this she wanted to end her life.  She states that if she had pills she would overdose on them but instead she ended up cutting her arm.  She states that she no longer wants to live, and that there is no point of living because she is unable to take custody of her daughter.  She denies any illicit drug use, denies alcohol use, she is not having any complaints.   Reviewed patient's chart seen multiple times for  suicidal ideations, recently discharged from behavioral health yesterday, her medications were updated is followed Lexapro 10 mg, Seroquel 12.5, prazosin 1 mg, gabapentin 300 mg, vraylar 1.5 mg.   Past Psychiatric History: as outlined above  Risk to Self:  yes Risk to Others:  no Prior Inpatient Therapy: yes  Prior Outpatient Therapy:  yes  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 fragile X syndrome    Chronic constipation    Depression    Drug-seeking behavior    Essential tremor    Headache    Overdose of acetaminophen 07/2017   and other meds   Personality disorder ([place])    Schizo-affective psychosis ([place])    Schizoaffective disorder, bipolar type ([place])    Seizures ([place])    Last seizure December 2017   Sleep apnea     Past Surgical History:  Procedure Laterality Date   MOUTH SURGERY  2009 or 2010   Family History:  Family History  Problem Relation Age of Onset   Mental illness Father    Asthma Father    PDD Brother    Seizures Brother    Family Psychiatric  History: unknown Social History:  Social History   Substance and Sexual Activity  Alcohol Use No   Alcohol/week: 1.0 standard drink of alcohol   Types: 1 Standard drinks or equivalent per week   Comment: denies at this time     Social History   Substance and Sexual Activity  Drug Use No   Comment: History of cocaine use at age 89 for 4 months    Social History   Socioeconomic History   Marital status: Widowed    Spouse name: Not on file   Number of children: 0   Years of education: Not on file   Highest education level: Not on file  Occupational History   Occupation: disability  Tobacco Use   Smoking status: Former    Packs/day: 0.00    Types: Cigarettes   Smokeless tobacco: Never   Tobacco comments:    Smoked for 2  years age [age]  Vaping Use   Vaping Use: Never used   Substance and Sexual Activity   Alcohol use: No    Alcohol/week: 1.0 standard drink of alcohol    Types: 1 Standard drinks or equivalent per week    Comment: denies at this time   Drug use: No    Comment: History of cocaine use at age 57 for 4 months   Sexual activity: Not Currently    Birth control/protection: None  Other Topics Concern   Not on file  Social History Narrative   Marital status: Widowed      Children: daughter      Lives: with boyfriend, in two story home      Employment:  Disability      Tobacco: quit smoking; smoked for two years.      Alcohol ;none      Drugs: none   Has not traveled outside of the country.  Right handed         Social Determinants of Health   Financial Resource Strain: Not on file  Food Insecurity: Not on file  Transportation Needs: Not on file  Physical Activity: Not on file  Stress: Not on file  Social Connections: Not on file   Additional Social History:    Allergies:   Allergies  Allergen Reactions   Bee Venom Anaphylaxis   Coconut (Cocos Nucifera) Anaphylaxis and Rash   Coconut Flavor Anaphylaxis and Rash   Fish Allergy Anaphylaxis   Geodon [Ziprasidone Hcl] Other (See Comments)    Pt states that this medication causes paralysis of the mouth.     Haloperidol And Related Other (See Comments)    Pt states that this medication causes paralysis of the mouth, jaw locks up   Lithobid [Lithium] Other (See Comments)    Seizure-like activity    Roxicodone [Oxycodone] Other (See Comments)    Hallucinations    Seroquel [Quetiapine] Other (See Comments)    Severe drowsiness Pt currently taking 50 mg, she is ok with this strength, she does not want higher dose.   Shellfish Allergy Anaphylaxis   Phenergan [Promethazine Hcl] Other (See Comments)    Chest pain     Prilosec [Omeprazole] Nausea And Vomiting   Sulfa Antibiotics Other (See Comments)    Chest pain    Tegretol [Carbamazepine] Nausea And Vomiting   Prozac  [Fluoxetine] Other (See Comments)    Increased Depression Suicidal thoughts   Tape Other (See Comments)    Skin tears, can only tolerate paper tape.   Tylenol [Acetaminophen] Other (See Comments)    Unknown reaction    Labs:  Results for orders placed or performed during the hospital encounter of 10/11/21 (from the past 48 hour(s))  Urine rapid drug screen (hosp performed)     Status: None   Collection Time: 10/11/21  3:42 AM  Result Value Ref Range   Opiates NONE DETECTED NONE DETECTED   Cocaine NONE DETECTED NONE DETECTED   Benzodiazepines NONE DETECTED NONE DETECTED   Amphetamines NONE DETECTED NONE DETECTED   Tetrahydrocannabinol NONE DETECTED NONE DETECTED   Barbiturates NONE DETECTED NONE DETECTED    Comment: (NOTE) DRUG SCREEN FOR MEDICAL PURPOSES ONLY.  IF CONFIRMATION IS NEEDED FOR ANY PURPOSE, NOTIFY LAB WITHIN 5 DAYS.  LOWEST DETECTABLE LIMITS FOR URINE DRUG SCREEN Drug Class                     Cutoff (ng/mL) Amphetamine and metabolites    1000 Barbiturate and metabolites    200 Benzodiazepine                 332 Tricyclics and metabolites     300 Opiates and metabolites        300 Cocaine and metabolites        300 THC                            50 Performed at [facility], [place] [address], [place], [place] [address]   Resp Panel by RT-PCR (Flu A&B, Covid) Anterior Nasal Swab     Status: None   Collection Time: 10/11/21  3:50 AM   Specimen: Anterior Nasal Swab  Result Value Ref Range   SARS Coronavirus 2 by RT PCR NEGATIVE NEGATIVE    Comment: (NOTE) SARS-CoV-2 target nucleic acids are NOT DETECTED.  The SARS-CoV-2 RNA is generally detectable in upper  respiratory specimens during the acute phase of infection. The lowest concentration of SARS-CoV-2 viral copies this assay can detect is 138 copies/mL. A negative result does not preclude SARS-Cov-2 infection and should not be used as the sole basis for treatment or other patient management  decisions. A negative result may occur with  improper specimen collection/handling, submission of specimen other than nasopharyngeal swab, presence of viral mutation(s) within the areas targeted by this assay, and inadequate number of viral copies(<138 copies/mL). A negative result must be combined with clinical observations, patient history, and epidemiological information. The expected result is Negative.  Fact Sheet for Patients:  EntrepreneurPulse.com.au  Fact Sheet for Healthcare Providers:  IncredibleEmployment.be  This test is no t yet approved or cleared by the [place] FDA and  has been authorized for detection and/or diagnosis of SARS-CoV-2 by FDA under an Emergency Use Authorization (EUA). This EUA will remain  in effect (meaning this test can be used) for the duration of the COVID-19 declaration under Section 564(b)(1) of the Act, 21 U.S.C.section 360bbb-3(b)(1), unless the authorization is terminated  or revoked sooner.       Influenza A by PCR NEGATIVE NEGATIVE   Influenza B by PCR NEGATIVE NEGATIVE    Comment: (NOTE) The Xpert Xpress SARS-CoV-2/FLU/RSV plus assay is intended as an aid in the diagnosis of influenza from Nasopharyngeal swab specimens and should not be used as a sole basis for treatment. Nasal washings and aspirates are unacceptable for Xpert Xpress SARS-CoV-2/FLU/RSV testing.  Fact Sheet for Patients: EntrepreneurPulse.com.au  Fact Sheet for Healthcare Providers: IncredibleEmployment.be  This test is not yet approved or cleared by the [place] FDA and has been authorized for detection and/or diagnosis of SARS-CoV-2 by FDA under an Emergency Use Authorization (EUA). This EUA will remain in effect (meaning this test can be used) for the duration of the COVID-19 declaration under Section 564(b)(1) of the Act, 21 U.S.C. section 360bbb-3(b)(1), unless the authorization  is terminated or revoked.  Performed at [facility], [place] [address], [place], [place] [address]     Medications:  Current Facility-Administered Medications  Medication Dose Route Frequency Provider Last Rate Last Admin   cariprazine (VRAYLAR) capsule 3 mg  3 mg Oral Daily Marcello Fennel, PA-C   3 mg at 10/11/21 1829   escitalopram (LEXAPRO) tablet 15 mg  15 mg Oral Daily Marcello Fennel, PA-C   15 mg at 10/11/21 9371   gabapentin (NEURONTIN) capsule 300 mg  300 mg Oral TID Marcello Fennel, PA-C   300 mg at 10/11/21 1613   prazosin (MINIPRESS) capsule 1 mg  1 mg Oral QHS Marcello Fennel, PA-C       QUEtiapine (SEROQUEL) tablet 12.5 mg  12.5 mg Oral Daily Mesner, Jason, MD   12.5 mg at 10/11/21 0930   QUEtiapine (SEROQUEL) tablet 25 mg  25 mg Oral QHS Mesner, Jason, MD       Current Outpatient Medications  Medication Sig Dispense Refill   albuterol (VENTOLIN HFA) 108 (90 Base) MCG/ACT inhaler Inhale 2 puffs into the lungs every 6 (six) hours as needed for wheezing or shortness of breath.     budesonide-formoterol (SYMBICORT) 160-4.5 MCG/ACT inhaler Inhale 2 puffs into the lungs 2 (two) times daily.     calcium carbonate (TUMS EX) 750 MG chewable tablet Chew 1,500 mg by mouth 2 (two) times daily as needed for heartburn.     cariprazine (VRAYLAR) 3 MG capsule Take 1 capsule (3 mg total) by mouth daily.  30 capsule    cyclobenzaprine (FLEXERIL) 10 MG tablet Take 10 mg by mouth at bedtime.     diclofenac Sodium (VOLTAREN) 1 % GEL Apply 2 g topically 4 (four) times daily. 100 g 0   escitalopram (LEXAPRO) 5 MG tablet Take 3 tablets (15 mg total) by mouth daily. 30 tablet 0   ferrous sulfate 325 (65 FE) MG tablet Take 1 tablet (325 mg total) by mouth every other day. 15 tablet 0   gabapentin (NEURONTIN) 300 MG capsule Take 1 capsule (300 mg total) by mouth 3 (three) times daily for 7 days. 21 capsule 0   hydrOXYzine (VISTARIL) 25 MG capsule Take 25 mg by mouth 4 (four)  times daily as needed for anxiety.     ibuprofen (ADVIL) 200 MG tablet Take 600 mg by mouth daily as needed for moderate pain or headache.     Lidocaine-Menthol (ICY HOT MAX LIDOCAINE EX) Apply 1 Application topically daily as needed (pain).     Multiple Vitamins-Minerals (EMERGEN-C IMMUNE PO) Take 1 packet by mouth daily as needed (Immune support).     pantoprazole (PROTONIX) 40 MG tablet TAKE 1 TABLET BY MOUTH DAILY (Patient taking differently: 40 mg daily.) 30 tablet 0   prazosin (MINIPRESS) 1 MG capsule Take 1 capsule (1 mg total) by mouth at bedtime for 7 days. 7 capsule 0   propranolol (INDERAL) 10 MG tablet Take 1 tablet (10 mg total) by mouth 2 (two) times daily.     QUEtiapine (SEROQUEL) 25 MG tablet Take 12.5-25 mg by mouth daily. Take one-half tablet (12.5 mg) in the morning and take one tablet (25 mg) at bedtime      Musculoskeletal: pt moves all extremities and ambulates independently Strength & Muscle Tone: within normal limits Gait & Station: normal Patient leans: N/A   Psychiatric Specialty Exam:  Presentation  General Appearance: Casual; Fairly Groomed  Eye Contact:Fair  Speech:Clear and Coherent  Speech Volume:Decreased  Handedness:Right   Mood and Affect  Mood:Anxious; Dysphoric; Depressed; Labile; Hopeless; Worthless  Affect:Congruent; Tearful; Labile   Thought Process  Thought Processes:Coherent; Goal Directed  Descriptions of Associations:Intact  Orientation:Full (Time, Place and Person)  Thought Content:Illogical  History of Schizophrenia/Schizoaffective disorder:No  Duration of Psychotic Symptoms:N/A  Hallucinations:Hallucinations: None (not present at time of assessment but pt endorse chronic history for AVH, no command hallucinations today)  Ideas of Reference:None  Suicidal Thoughts:Suicidal Thoughts: Yes, Active SI Active Intent and/or Plan: With Intent; With Plan; With Means to [place]; With Access to Means  Homicidal  Thoughts:Homicidal Thoughts: No   Sensorium  Memory:Immediate Good; Recent Good; Remote Good  Judgment:Poor  Insight:Poor   Executive Functions  Concentration:Fair  Attention Span:Fair  [place] of Knowledge:Good  Language:Good   Psychomotor Activity  Psychomotor Activity:Psychomotor Activity: Normal   Assets  Assets:Communication Skills; Housing; Social Support   Sleep  Sleep:Sleep: Fair Number of Hours of Sleep: 4    Physical Exam: Physical Exam Constitutional:      Appearance: She is obese.    Cardiovascular:     Rate and Rhythm: Normal rate.     Pulses: Normal pulses.  Pulmonary:     Effort: Pulmonary effort is normal.  Musculoskeletal:        General: Normal range of motion.     Cervical back: Normal range of motion.  Neurological:     Mental Status: She is alert and oriented to person, place, and time. Mental status is at baseline.  Psychiatric:  Attention and Perception: Attention and perception normal.        Mood and Affect: Mood is anxious and depressed. Affect is labile.        Speech: Speech normal.        Behavior: Behavior normal. Behavior is cooperative.        Thought Content: Thought content includes suicidal ideation. Plan of suicide: pt cut her wrist prior to arrival, states suicide attempt.        Cognition and Memory: Cognition and memory normal.        Judgment: Judgment is impulsive and inappropriate.    Review of Systems  Constitutional: Negative.   HENT: Negative.    Eyes: Negative.   Respiratory: Negative.    Cardiovascular: Negative.   Gastrointestinal: Negative.   Genitourinary: Negative.   Musculoskeletal: Negative.   Skin: Negative.   Psychiatric/Behavioral:  Positive for depression, hallucinations (denies at present) and suicidal ideas. Negative for substance abuse.    Blood pressure 116/71, pulse 100, temperature 98.1 F (36.7 C), temperature source Oral, resp. rate 18, SpO2 97 %. There is no  height or weight on file to calculate BMI.  Treatment Plan Summary: Pt with history of multiple suicide attempts, 3 prior attempts since June 2023 and subsequent inpatient admissions with medication adjustments.  However, at present, pt does not contract for safety, is tearful, endorses hopelessness, and despair.  She is an acute safety concern and endorses her plan to end her life once discharged. She difficulty using coping skills, suspect her Asperger's contributes to this concern.    As pt cannot keep herself safe with current living arrangements, she may benefit from more of a group home setting or more restrictive environment in which she does not have access to sharp objects that can be used for self harm; and where staff store and offer medications assistance. For now, will recommend for inpatient admission, since she's been at [facility] a few times, maybe she would benefit from being sent to another facility for programming.   Daily contact with patient to assess and evaluate symptoms and progress in treatment and Medication management  Disposition: Recommend psychiatric Inpatient admission when medically cleared.  This service was provided via telemedicine using a 2-way, interactive audio and video technology.  Names of all persons participating in this telemedicine service and their role in this encounter. Name: [name] Role: Patient  Name: Merlyn Lot Role: PMHNP    Mallie Darting, NP 10/11/2021 4:24 PM

## 2021-10-11 NOTE — ED Notes (Signed)
Patient came to nurses station and stated that she wanted to leave since she couldn't get her flexeril.  This RN stated that she would talk to the doctor.  Patient walked back to room and is currently in room talking to herself, angry about not getting the flexeril.

## 2021-10-11 NOTE — ED Notes (Signed)
Patient done with dinner, dinner tray removed from room and trashed.

## 2021-10-11 NOTE — Progress Notes (Signed)
Inpatient Behavioral Health Placement  Pt meets inpatient criteria per Evelyn Lot, [name]. There asre no available beds at [facility] per [name] Evelyn Shields, [name]. Referral was sent to the following facilities;    Destination Service Provider Address Phone Fax  [facility]  [address] [place] [address] [phone] [facility]  [place], [place] [address] [place]  [facility]  [address] [place] [address] [phone]  [facility]  [place], [place] [address] [phone] [facility]  [address] [place], [place] [address] [phone] [facility]  [address] [place] [address] [place]  [facility]  [address] [place] [address] [phone]  [facility]  [address] [place] [address] [phone]  [place] [address] [place] [address] [phone]  [facility]  [address] [place] [address] [phone]  [facility]  [address], [place] [address] [phone]  [facility]  [address], [place] [address] [phone] [place]  [address] [place] [address] [phone] [facility]  [address], [place] [address] [phone] [facility]  [address] [place] [address] [phone]  [facility]  [address] [place] [address] [phone]   Situation ongoing,  CSW will follow  up.   [name], [name], [facility] 10/11/2021  @ 5:04 PM

## 2021-10-11 NOTE — ED Notes (Signed)
This tech attempted to draw blood on pt x1, after first unsuccessful attempt pt would not let this tech try again. I also attempted to get pt's EKG and she stated "she didn't want all of those stickers and wires hooked up to her because they scared her. Triage RN notified.

## 2021-10-11 NOTE — ED Notes (Signed)
Patient back in room, calm and cooperative at this time.  Patient requested and given sprite.

## 2021-10-11 NOTE — ED Notes (Signed)
Patient requesting flexeril for her fibromyalgia.  MD Darl Householder made aware.

## 2021-10-11 NOTE — ED Notes (Signed)
Pt changed into burgundy scrubs and yellow socks and wanded by security. She is resting in a recliner in triage at this time, belongings in triage as well.

## 2021-10-11 NOTE — BH Assessment (Signed)
Per Dr. Dwyane Dee in the 9am morning huddle/bed meeting, Provider to assess. No TTS CCA is needed at this time.

## 2021-10-11 NOTE — ED Provider Triage Note (Signed)
Emergency Medicine Provider Triage Evaluation Note  [name] , a 31 y.o. female  was evaluated in triage.  Pt complains of suicidal ideations, states that she has been without her medications for day has been unable to pick them up, states that attempt to end her life she wanted to cut her wrist, she had pills she would take them, denies any hallucinations or delusions, denies any illicit drug use.  Seen in the past for similar presentation was most recently discharged on the 30th, was cleared by psych, medications were changed at that time..  Review of Systems  Positive: Suicidal ideations, chest pain Negative: Polysubstance use, homicidal ideations  Physical Exam  BP 117/63 (BP Location: Right Arm)   Pulse 97   Temp 98.2 F (36.8 C)   Resp 17   SpO2 99%  Gen:   Awake, no distress   Resp:  Normal effort  MSK:   Moves extremities without difficulty  Other:    Medical Decision Making  Medically screening exam initiated at 4:49 AM.  Appropriate orders placed.  [name] was informed that the remainder of the evaluation will be completed by another provider, this initial triage assessment does not replace that evaluation, and the importance of remaining in the ED until their evaluation is complete.  Presents with suicidal ideations, will need further work-up.   Marcello Fennel, PA-C 10/11/21 [phone]

## 2021-10-11 NOTE — ED Triage Notes (Signed)
Patient arrived with EMS from home reports suicidal ideation with A/V hallucinations , multiple superficial skin lacerations at right forearm with minimal bleeding .

## 2021-10-11 NOTE — ED Notes (Signed)
Pt has refused EKG and all blood work . Pt has also refused TTS.

## 2021-10-11 NOTE — ED Notes (Signed)
Pt arrived to room 50 with 3 bags of belongings. Bags placed on locker 5

## 2021-10-11 NOTE — ED Notes (Signed)
PT ate lunch

## 2021-10-11 NOTE — ED Notes (Signed)
Pt refusing blood work and an EKG at this time. PA aware and will continue to monitor pt.

## 2021-10-11 NOTE — ED Notes (Signed)
Pharmacy tech reviewed meds with PT

## 2021-10-11 NOTE — ED Notes (Signed)
Pt continues to refuse Blood work

## 2021-10-11 NOTE — ED Provider Notes (Signed)
  Physical Exam  BP 115/71 (BP Location: Right Arm)   Pulse 100   Temp 98.6 F (37 C) (Oral)   Resp 20   SpO2 97%   Physical Exam  Procedures  Procedures  ED Course / MDM    Medical Decision Making I was called by the nurse around 10 PM because patient request Flexeril for her fibromyalgia.  I reviewed records from psychiatry discharge summary yesterday.  Patient was actually not given Flexeril when she was in the hospital.  She has multiple suicidal attempts and has very poor impulse control.  I tried lidocaine patch which she states that did not help.  She is threatening to sign out [place] if she does not get her Flexeril.  I told her that I will give her a dose of Flexeril tonight.  However this needs to be revisited tomorrow with psychiatry.  She has multiple suicidal attempts so I do not think she should be allowed to leave at this point.  She states that as long as she gets her Flexeril she would stay so I held off on involuntarily committing her.    Amount and/or Complexity of Data Reviewed Labs: ordered.  Risk Prescription drug management.          Drenda Freeze, MD 10/11/21 [phone]

## 2021-10-11 NOTE — ED Notes (Signed)
Therapeutic listening provided.  Patient upset because she doesn't have custody of her daughter.  Patient reports that she is suicidal and cuts herself because she doesn't have custody of her daughter.  Patient assisted with planning future goals of staying out of the hospital and getting better.  Patient states that she wants to get better so she can get her daughter.

## 2021-10-11 NOTE — ED Notes (Signed)
Patient's abrasions cleaned with wound cleaner and new bandages applied.  Patient's TV cut on and lights cut off per request.  Patient is no longer tearful, is calm and cooperative at this time.  Patient is still refusing bloodwork stating that "she's done being stuck because they messed it up earlier."

## 2021-10-11 NOTE — ED Provider Notes (Signed)
[facility] EMERGENCY DEPARTMENT Provider Note   CSN: [id number] Arrival date & time: 10/11/21  0324     History  Chief Complaint  Patient presents with   Suicidal    [name] is a 31 y.o. female.  HPI   Patient with medical history including depression, autism, personality disorder, schizophrenia, presents with complaints of suicidal ideations.  Patient states that she is feeling depressed, she states that she has been unable to take her medicine as she could not pick it up yesterday and she does not have motivation to get it today, she states that because of this she wanted to end her life.  She states that if she had pills she would overdose on them but instead she ended up cutting her arm.  She states that she no longer wants to live, and that there is no point of living because she is unable to take custody of her daughter.  She denies any illicit drug use, denies alcohol use, she is not having any complaints.  Reviewed patient's chart seen multiple times for suicidal ideations, recently discharged from behavioral health yesterday, her medications were updated is followed Lexapro 10 mg, Seroquel 12.5, prazosin 1 mg, gabapentin 300 mg, vraylar 1.5 mg.  Home Medications Prior to Admission medications   Medication Sig Start Date End Date Taking? Authorizing Provider  albuterol (VENTOLIN HFA) 108 (90 Base) MCG/ACT inhaler Inhale 2 puffs into the lungs every 6 (six) hours as needed for wheezing or shortness of breath.    [provider]  budesonide-formoterol (SYMBICORT) 160-4.5 MCG/ACT inhaler Inhale 2 puffs into the lungs 2 (two) times daily.    [provider]  calcium carbonate (TUMS EX) 750 MG chewable tablet Chew 1,500 mg by mouth 2 (two) times daily as needed for heartburn.    [provider]  cariprazine (VRAYLAR) 3 MG capsule Take 1 capsule (3 mg total) by mouth daily. 07/12/21   Massengill, Ovid Curd, MD  cyclobenzaprine  (FLEXERIL) 10 MG tablet Take 10 mg by mouth at bedtime.    [provider]  diclofenac Sodium (VOLTAREN) 1 % GEL Apply 2 g topically 4 (four) times daily. 06/23/21   Harris, Abigail, PA-C  escitalopram (LEXAPRO) 5 MG tablet Take 3 tablets (15 mg total) by mouth daily. 09/05/21   Merrily Brittle, DO  ferrous sulfate 325 (65 FE) MG tablet Take 1 tablet (325 mg total) by mouth every other day. 10/11/21 11/10/21  France Ravens, MD  gabapentin (NEURONTIN) 300 MG capsule Take 1 capsule (300 mg total) by mouth 3 (three) times daily for 7 days. 07/11/21 12/16/21  Massengill, Ovid Curd, MD  hydrOXYzine (VISTARIL) 25 MG capsule Take 25 mg by mouth 4 (four) times daily as needed for anxiety.    [provider]  ibuprofen (ADVIL) 200 MG tablet Take 600 mg by mouth daily as needed for moderate pain or headache.    [provider]  Lidocaine-Menthol (ICY HOT MAX LIDOCAINE EX) Apply 1 Application topically daily as needed (pain).    [provider]  Multiple Vitamins-Minerals (EMERGEN-C IMMUNE PO) Take 1 packet by mouth daily as needed (Immune support).    [provider]  pantoprazole (PROTONIX) 40 MG tablet TAKE 1 TABLET BY MOUTH DAILY Patient taking differently: 40 mg daily. 04/26/21   Jeanie Sewer, NP  prazosin (MINIPRESS) 1 MG capsule Take 1 capsule (1 mg total) by mouth at bedtime for 7 days. 07/28/21 11/11/21  Massengill, Ovid Curd, MD  propranolol (INDERAL) 10 MG tablet Take  1 tablet (10 mg total) by mouth 2 (two) times daily. 07/11/21   Massengill, Ovid Curd, MD  QUEtiapine (SEROQUEL) 25 MG tablet Take 12.5-25 mg by mouth daily. Take one-half tablet (12.5 mg) in the morning and take one tablet (25 mg) at bedtime    [provider]      Allergies    Bee venom, Coconut (cocos nucifera), Coconut flavor, Fish allergy, Geodon [ziprasidone hcl], Haloperidol and related, Lithobid [lithium], Roxicodone [oxycodone], Seroquel [quetiapine], Shellfish allergy, Phenergan [promethazine  hcl], Prilosec [omeprazole], Sulfa antibiotics, Tegretol [carbamazepine], Prozac [fluoxetine], Tape, and Tylenol [acetaminophen]    Review of Systems   Review of Systems  Constitutional:  Negative for chills and fever.  Respiratory:  Negative for shortness of breath.   Cardiovascular:  Negative for chest pain.  Gastrointestinal:  Negative for abdominal pain.  Skin:  Positive for wound.  Neurological:  Negative for headaches.  Psychiatric/Behavioral:  Positive for self-injury and suicidal ideas.     Physical Exam Updated Vital Signs BP 117/63 (BP Location: Right Arm)   Pulse 97   Temp 98.2 F (36.8 C)   Resp 17   SpO2 99%  Physical Exam Vitals and nursing note reviewed.  Constitutional:      General: She is not in acute distress.    Appearance: She is not ill-appearing.  HENT:     Head: Normocephalic and atraumatic.     Nose: No congestion.  Eyes:     Extraocular Movements: Extraocular movements intact.     Conjunctiva/sclera: Conjunctivae normal.     Pupils: Pupils are equal, round, and reactive to light.  Cardiovascular:     Rate and Rhythm: Normal rate and regular rhythm.     Pulses: Normal pulses.     Heart sounds: No murmur heard.    No friction rub.  Pulmonary:     Effort: Pulmonary effort is normal.  Skin:    General: Skin is warm and dry.     Comments: Superficial lacerations noted on her left forearm hemodynamically stable.  Neurological:     Mental Status: She is alert.     Comments: No facial asymmetry no difficulty with word finding following two-step commands no unilateral weakness present.  Psychiatric:        Mood and Affect: Mood normal.     Comments: Responding appropriately, does not appear to be responding to internal stimuli, endorses suicidal ideation with plan to overdose, denies homicidal ideation.     ED Results / Procedures / Treatments   Labs (all labs ordered are listed, but only abnormal results are displayed) Labs Reviewed  RESP  PANEL BY RT-PCR (FLU A&B, COVID) ARPGX2  RAPID URINE DRUG SCREEN, HOSP PERFORMED  COMPREHENSIVE METABOLIC PANEL  ETHANOL  CBC WITH DIFFERENTIAL/PLATELET  I-STAT BETA HCG BLOOD, ED (MC, WL, AP ONLY)    EKG None  Radiology No results found.  Procedures Procedures    Medications Ordered in ED Medications  cariprazine (VRAYLAR) capsule 3 mg (has no administration in time range)  escitalopram (LEXAPRO) tablet 15 mg (has no administration in time range)  gabapentin (NEURONTIN) capsule 300 mg (has no administration in time range)  prazosin (MINIPRESS) capsule 1 mg (has no administration in time range)  QUEtiapine (SEROQUEL) tablet 12.5-25 mg (has no administration in time range)    ED Course/ Medical Decision Making/ A&P                           Medical Decision Making Amount  and/or Complexity of Data Reviewed Labs: ordered.  Risk Prescription drug management.   This patient presents to the ED for concern of suicide ideation, this involves an extensive number of treatment options, and is a complaint that carries with it a high risk of complications and morbidity.  The differential diagnosis includes electrolyte derailments, withdrawals, psychiatric emergency    Additional history obtained:  Additional history obtained from N/A External records from outside source obtained and reviewed including behavioral health notes   Co morbidities that complicate the patient evaluation  Psychiatric disorder  Social Determinants of Health:  N/A    Lab Tests:  I Ordered, and personally interpreted labs.  The pertinent results include: Rapid urine drug screen negative, respiratory panel negative, CBC CMP pending at this time   Imaging Studies ordered:  I ordered imaging studies including N/A I independently visualized and interpreted imaging which showed N/A I agree with the radiologist interpretation   Cardiac Monitoring:  The patient was maintained on a cardiac  monitor.  I personally viewed and interpreted the cardiac monitored which showed an underlying rhythm of: Pending   Medicines ordered and prescription drug management:  I ordered medication including N/A I have reviewed the patients home medicines and have made adjustments as needed  Critical Interventions:  N/A   Reevaluation:  Presents with suicide ideations, she is superficial laceration of her left forearm, will obtain med clearance work-up consult TTS continue to monitor.  Consultations Obtained:  TTS consultation pending    Test Considered:  N/A    Rule out Low suspicion for withdrawals as she is nontremulous on my exam, presentation is atypical of etiology.    Dispostion and problem list  Due to shift change patient handed off for Evelyn Broad PA   Follow-up on lab work, medically clear and wait for TTS recommendations, medication have been ordered, she is not under IVC at this time            Final Clinical Impression(s) / ED Diagnoses Final diagnoses:  None    Rx / DC Orders ED Discharge Orders     None         Marcello Fennel, PA-C 10/11/21 4481    Merrily Pew, MD 10/11/21 [phone]

## 2021-10-11 NOTE — ED Notes (Signed)
Pt in hallway resting. NAD Chest rising and falling. Pt does report still having SI with AH. Reports plan to take pills or cut wrist. Updated oN plan of care. Pt given blanket.

## 2021-10-11 NOTE — ED Notes (Signed)
TTS done 

## 2021-10-11 NOTE — ED Notes (Signed)
Patient upset that she is not getting her flexeril for her fibromyalgia.  Patient made aware that this RN didn't have an order to give it to her but the MD was aware and ordered a lidocaine patch.  Patient agreed to lido patch without further complaint.

## 2021-10-11 NOTE — ED Notes (Signed)
Meal given

## 2021-10-11 NOTE — ED Notes (Signed)
PT sleeping

## 2021-10-11 NOTE — ED Notes (Signed)
TC for pharmacy tech to varify home meds

## 2021-10-11 NOTE — ED Notes (Signed)
Pt sleeping. 

## 2021-10-11 NOTE — ED Provider Notes (Signed)
31 year old female presents with suicidal ideation with plan to cut wrist or take pills.  Recently seen and admitted to behavioral health for similar and discharged.  Patient has refused lab work.  Prior labs reviewed.  Plan is for behavioral health to evaluate patient.  ED attending consulted, patient does not need labs repeated at this time. [place] note reviewed, discussed patient in morning meeting, will defer TTS and have [place] provider see patient.  Physical Exam  BP 103/61 (BP Location: Right Arm)   Pulse 87   Temp 98 F (36.7 C) (Oral)   Resp 16   SpO2 100%   Physical Exam  Procedures  Procedures  ED Course / MDM    Medical Decision Making Amount and/or Complexity of Data Reviewed Labs: ordered.  Risk Prescription drug management.   Patient moved to purple pending eval with [facility] provider.  Home meds ordered by night team.        Tacy Learn, PA-C 10/11/21 1138    Pattricia Boss, MD 10/12/21 [phone]

## 2021-10-12 ENCOUNTER — Encounter (HOSPITAL_COMMUNITY): Payer: Self-pay

## 2021-10-12 DIAGNOSIS — T1491XA Suicide attempt, initial encounter: Secondary | ICD-10-CM | POA: Diagnosis not present

## 2021-10-12 LAB — CBC WITH DIFFERENTIAL/PLATELET
Abs Immature Granulocytes: 0.04 10*3/uL (ref 0.00–0.07)
Basophils Absolute: 0 10*3/uL (ref 0.0–0.1)
Basophils Relative: 1 %
Eosinophils Absolute: 0.1 10*3/uL (ref 0.0–0.5)
Eosinophils Relative: 1 %
HCT: 36.5 % (ref 36.0–46.0)
Hemoglobin: 11.2 g/dL — ABNORMAL LOW (ref 12.0–15.0)
Immature Granulocytes: 1 %
Lymphocytes Relative: 31 %
Lymphs Abs: 2.5 10*3/uL (ref 0.7–4.0)
MCH: 25.9 pg — ABNORMAL LOW (ref 26.0–34.0)
MCHC: 30.7 g/dL (ref 30.0–36.0)
MCV: 84.5 fL (ref 80.0–100.0)
Monocytes Absolute: 0.6 10*3/uL (ref 0.1–1.0)
Monocytes Relative: 7 %
Neutro Abs: 5 10*3/uL (ref 1.7–7.7)
Neutrophils Relative %: 59 %
Platelets: 285 10*3/uL (ref 150–400)
RBC: 4.32 MIL/uL (ref 3.87–5.11)
RDW: 13.8 % (ref 11.5–15.5)
WBC: 8.2 10*3/uL (ref 4.0–10.5)
nRBC: 0 % (ref 0.0–0.2)

## 2021-10-12 LAB — COMPREHENSIVE METABOLIC PANEL
ALT: 16 U/L (ref 0–44)
AST: 18 U/L (ref 15–41)
Albumin: 3.8 g/dL (ref 3.5–5.0)
Alkaline Phosphatase: 65 U/L (ref 38–126)
Anion gap: 8 (ref 5–15)
BUN: 11 mg/dL (ref 6–20)
CO2: 24 mmol/L (ref 22–32)
Calcium: 9.1 mg/dL (ref 8.9–10.3)
Chloride: 105 mmol/L (ref 98–111)
Creatinine, Ser: 0.78 mg/dL (ref 0.44–1.00)
GFR, Estimated: >60 mL/min (ref >60)
Glucose, Bld: 87 mg/dL (ref 70–99)
Potassium: 3.6 mmol/L (ref 3.5–5.1)
Sodium: 137 mmol/L (ref 135–145)
Total Bilirubin: <0.1 mg/dL — ABNORMAL LOW (ref 0.3–1.2)
Total Protein: 6.7 g/dL (ref 6.5–8.1)

## 2021-10-12 LAB — I-STAT BETA HCG BLOOD, ED (MC, WL, AP ONLY): I-stat hCG, quantitative: <5 m[IU]/mL (ref <5)

## 2021-10-12 LAB — ETHANOL: Alcohol, Ethyl (B): <10 mg/dL (ref <10)

## 2021-10-12 MED ORDER — HYDROXYZINE HCL 50 MG PO TABS
50.0000 mg | ORAL_TABLET | Freq: Once | ORAL | Status: AC | PRN
  Administered 2021-10-12: 50 mg via ORAL
  Filled 2021-10-12: qty 1

## 2021-10-12 MED ORDER — ALBUTEROL SULFATE HFA 108 (90 BASE) MCG/ACT IN AERS: 2.0000 | INHALATION_SPRAY | Freq: Four times a day (QID) | RESPIRATORY_TRACT | Status: DC | PRN

## 2021-10-12 MED ORDER — LORAZEPAM 2 MG/ML IJ SOLN
2.0000 mg | Freq: Three times a day (TID) | INTRAMUSCULAR | Status: DC | PRN
  Administered 2021-10-14: 2 mg via INTRAMUSCULAR
  Filled 2021-10-12: qty 1

## 2021-10-12 MED ORDER — IBUPROFEN 400 MG PO TABS
400.0000 mg | ORAL_TABLET | Freq: Four times a day (QID) | ORAL | Status: DC | PRN
  Administered 2021-10-12 – 2021-10-14 (×3): 400 mg via ORAL
  Filled 2021-10-12 (×3): qty 1

## 2021-10-12 MED ORDER — LORAZEPAM 1 MG PO TABS
1.0000 mg | ORAL_TABLET | ORAL | Status: AC | PRN
  Administered 2021-10-13: 1 mg via ORAL
  Filled 2021-10-12: qty 1

## 2021-10-12 MED ORDER — RISPERIDONE 1 MG PO TBDP
2.0000 mg | ORAL_TABLET | Freq: Three times a day (TID) | ORAL | Status: DC | PRN
  Administered 2021-10-13 – 2021-10-14 (×2): 2 mg via ORAL
  Filled 2021-10-12 (×3): qty 2

## 2021-10-12 MED ORDER — MOMETASONE FURO-FORMOTEROL FUM 200-5 MCG/ACT IN AERO
2.0000 | INHALATION_SPRAY | Freq: Two times a day (BID) | RESPIRATORY_TRACT | Status: DC
  Administered 2021-10-12 – 2021-10-13 (×2): 2 via RESPIRATORY_TRACT
  Filled 2021-10-12 (×2): qty 8.8

## 2021-10-12 MED ORDER — HYDROXYZINE HCL 50 MG PO TABS
50.0000 mg | ORAL_TABLET | Freq: Four times a day (QID) | ORAL | Status: DC | PRN
  Administered 2021-10-14: 50 mg via ORAL
  Filled 2021-10-12: qty 1

## 2021-10-12 MED ORDER — FERROUS SULFATE 325 (65 FE) MG PO TABS
325.0000 mg | ORAL_TABLET | ORAL | Status: DC
  Administered 2021-10-12 – 2021-10-14 (×2): 325 mg via ORAL
  Filled 2021-10-12 (×2): qty 1

## 2021-10-12 MED ORDER — LORAZEPAM 2 MG/ML IJ SOLN: 2.0000 mg | INTRAMUSCULAR | Status: DC | PRN

## 2021-10-12 MED ORDER — PROPRANOLOL HCL 10 MG PO TABS
10.0000 mg | ORAL_TABLET | Freq: Two times a day (BID) | ORAL | Status: DC
  Administered 2021-10-12 – 2021-10-14 (×3): 10 mg via ORAL
  Filled 2021-10-12 (×8): qty 1

## 2021-10-12 MED ORDER — CYCLOBENZAPRINE HCL 10 MG PO TABS
10.0000 mg | ORAL_TABLET | Freq: Every day | ORAL | Status: DC
  Administered 2021-10-12 – 2021-10-13 (×2): 10 mg via ORAL
  Filled 2021-10-12 (×2): qty 1

## 2021-10-12 NOTE — ED Notes (Signed)
Pt taking shower.  

## 2021-10-12 NOTE — ED Notes (Signed)
Sitter at bedside.

## 2021-10-12 NOTE — ED Notes (Signed)
Pt refusing EKG and blood draw.

## 2021-10-12 NOTE — ED Notes (Signed)
[place] DO came to see pt and pt then walked out of purple zone saying that she was leaving. [employer] followed pt and security was notified of pt attempting to leave. Pt found banging her head on the wall in the hallway and tearful about not being able to get her daughter back. Pt was escorted back to her room and is now in her room talking w/ security and crying.

## 2021-10-12 NOTE — ED Notes (Signed)
Messaged pharmacy to have them send cariprazine

## 2021-10-12 NOTE — Consult Note (Signed)
[name], 31 y.o., female patient who was evaluated by psychiatry on 10/11/2021 following a suicide attempt via cutting her left wrist with a razor; Pt was recommended for inpatient admission.  The plan of care was discussed with patient concordance.  Today, I am told by nursing staff that patient endorses suicidal ideations , plan earlier today but has now decided she wants to go home.    I saw the patient today via tts cart, pt reports she was told she may not go [facility], instead may go to another psychiatric facility which upsets her.  Pt has been inpatient at [name] 3-4 times this year for suicide attempts, and requests to go there today.  She reports it's close to her home and she is familiar with the program.  In talking with her today, there seems to be some secondary gain with pt going to [facility].  PT continues to endorse SI but no plan identified today.   Plans for continued admission and plan to IVC if she attempts to elope is discussed with patient who appears irritated and begins pacing in her room. Reviewed coping skills and tools to de-escalate with pt to which she turned away from the camera.   Recommendations: Pt continues to meet criteria for inpatient admission. Pt may benefit from being faxed out to other psychiatric inpatient facilities. However, if [facility] bed becomes available first, I recommend transfer to [facility] to expedite inpatient care.   Continue medications.   Merlyn Lot, PMHNP

## 2021-10-12 NOTE — ED Notes (Signed)
Patient can be heard talking to herself in her room

## 2021-10-12 NOTE — ED Notes (Signed)
Phlebotomy at bedside.

## 2021-10-12 NOTE — ED Notes (Signed)
Pt escorted back to her room by security.

## 2021-10-12 NOTE — ED Notes (Signed)
Pt reporting that she wants to leave and that she doesn't want to go to another facility other than [facility] because she won't have a ride home and she doesn't have money because she lost her job. Notified EDP. Pt stating, "I'm not staying. I'm going to leave".

## 2021-10-12 NOTE — ED Notes (Signed)
Belongings located in locker #5

## 2021-10-12 NOTE — ED Notes (Signed)
Pt given [place] sandwich d/t pt doesn't like pancakes.

## 2021-10-12 NOTE — ED Notes (Signed)
Received verbal report from Jory Sims RN at this time

## 2021-10-12 NOTE — ED Provider Notes (Signed)
Emergency Medicine Observation Re-evaluation Note  [name] is a 31 y.o. female, seen on rounds today.  Pt initially presented to the ED for complaints of Suicidal Currently, the patient is awake in bed eating breakfast with no acute complaints.  Physical Exam  BP 120/66 (BP Location: Right Arm)   Pulse 99   Temp 97.7 F (36.5 C) (Oral)   Resp 16   SpO2 96%  Physical Exam General: Awake and alert no acute distress Cardiac: Regular rate Lungs: No increased work of breathing Psych: Calm and cooperative  ED Course / MDM  EKG:   I have reviewed the labs performed to date as well as medications administered while in observation.  Recent changes in the last 24 hours include no significant change, pending inpatient placement.  Plan  Current plan is for inpatient placement, pending acceptance to a facility.    Ottie Glazier, DO 10/12/21 [phone]

## 2021-10-12 NOTE — ED Notes (Signed)
Called staffing regarding pt needing a sitter, per staffing no sitters are available at this time. Notified CN.

## 2021-10-12 NOTE — ED Notes (Signed)
Pt refusing oral risperdal and oral ativan. Explained to pt that the meds ordered are risperdal and then 30 min later ativan if needed, or IM ativan. Pt refusing both. Pt requesting vistaril. Notified Jerelene Redden NP for recommendations.

## 2021-10-12 NOTE — ED Notes (Signed)
Evelyn Redden NP talking to pt via TTS

## 2021-10-12 NOTE — ED Notes (Signed)
Review of IVC Status: Informed by [place] RN that this patient is voluntary.

## 2021-10-12 NOTE — ED Notes (Signed)
Pt ambulated to restroom at this time.

## 2021-10-12 NOTE — ED Notes (Signed)
[employer] at pt side. Pt trying to elope out of purple zone and threatening physical assault on staff attempting to redirect her. Security now at bedside as well. Pt refusing to go back to her room.

## 2021-10-12 NOTE — ED Notes (Signed)
Dressing on pt's arm changed and applied two 4x4 gauze and secured w/ cloth tape squares.

## 2021-10-12 NOTE — Progress Notes (Signed)
Inpatient Behavioral Health Placement  Pt meets inpatient criteria per Merlyn Lot, NP.  Referral was sent to the following facilities;   Destination Service Provider Address Phone Fax  [facility]  [address] [place] [address] [phone] [facility]  [name], [place] [address] [place]  [facility]  [address] [place] [address] [phone]  [facility]  [place], [place] [address] [phone] [facility]  [address] [place], [place] [address] [phone] [facility]  [address] [place] [address] [place]  [facility]  [address] [place] [address] [phone]  [facility]  [address], [place] [address] [phone]  [place] [address], [place] [address] [phone]  [facility]  [address], [place] [address] [phone]  [facility]  [address], [place] [address] [phone]  [facility]  [address], [place] [address] [phone] [place]  [address] [place] [address] [phone] [facility]  [address] [place] [address] [phone] [facility]  [address], [place] [address] [phone]  [facility]  [address] [place]  [address] [phone]    Situation ongoing,  CSW will follow up.   Benjaman Kindler, MSW, [facility] 10/12/2021  @ 11:46 PM

## 2021-10-12 NOTE — ED Notes (Signed)
Pt currently refusing vistaril. Explained to pt that if she keeps having behaviors and trying to leave we will have to give her some medicine to help calm her down. Pt irritable and stated, "Fine, I will just go to sleep then!". Notified EDP.

## 2021-10-12 NOTE — ED Notes (Signed)
Pt agreeable to lab draw at this time. Notified phlebotomy. Phlebotomy reports they will be over as soon as they are able.

## 2021-10-13 DIAGNOSIS — T1491XA Suicide attempt, initial encounter: Secondary | ICD-10-CM | POA: Diagnosis not present

## 2021-10-13 NOTE — ED Notes (Signed)
Pt ambulates to and from bathroom without assistance. Provided with graham crackers and soda per request

## 2021-10-13 NOTE — ED Provider Notes (Signed)
Emergency Medicine Observation Re-evaluation Note  [name] is a 31 y.o. female, seen on rounds today.  Pt initially presented to the ED for complaints of Suicidal Currently, the patient is calm.  Physical Exam  BP 114/78 (BP Location: Right Arm)   Pulse 96   Temp 98 F (36.7 C) (Oral)   Resp 18   Ht '5\' 4"'$  (1.626 m)   Wt 89.8 kg   SpO2 96%   BMI 33.99 kg/m  Physical Exam General: No acute distress Cardiac: Regular rate Lungs: No respiratory distress Psych: Calm, appear to be responding to internal stimuli  ED Course / MDM  EKG:   I have reviewed the labs performed to date as well as medications administered while in observation.  Recent changes in the last 24 hours include no new changes.  It appears that patient is continuing to have hallucinations.  Plan  Current plan is for inpatient psychiatric admission for stabilization.    Evelyn Biles, MD 10/13/21 1058

## 2021-10-13 NOTE — ED Notes (Signed)
Ambulated to restroom at this time 

## 2021-10-13 NOTE — Progress Notes (Signed)
Patient has been denied by [facility] due to no appropriate beds available. Patient meets [facility] inpatient criteria per Merlyn Lot, NP. Patient has been faxed out to the following facilities:    [facility]  [address] [place] [address] [phone] [facility]  [place], [place] [address] [place]  [facility]  [address] [place] [address] [phone]  [place]  [place], [place] [address] [phone]  [facility]  [address] [place], [place] [address] [phone] [facility]  [address] [place] [address] [place]  [facility]  [address] [place] [address] [phone]  [facility]  [address], [place] [address] [phone]  [place] [address], [place] [address] [phone]  [facility]  [address] [place] [address] [phone]  [facility]  [address] [place] [address] [phone]  [facility]  [address], [place] [address] [phone] [place]  [address] [place] [address] [phone] [facility]  [address], [place] [address] [phone] [facility]  [address], [place] [address] [phone]  [facility]  [address] [place] [address] [phone]  [facility]  [address], [place] [address] [phone] [facility]  [address], [place] [address] [phone] [place], MSW, LCSW-A  11:27 PM 10/13/2021

## 2021-10-13 NOTE — ED Notes (Signed)
Pt stepped out of the room and stated she is hearing voices that are telling her to kill herself. Dr. Dayna Barker made aware of same

## 2021-10-14 DIAGNOSIS — T1491XA Suicide attempt, initial encounter: Secondary | ICD-10-CM | POA: Diagnosis not present

## 2021-10-14 NOTE — ED Notes (Signed)
Patient given belongings. States she would like to take a shower prior to leaving.

## 2021-10-14 NOTE — Progress Notes (Signed)
CSW followed up with Pamala Hurry with [facility] admissions in reference to referral sent for placement. Pamala Hurry requested more provider notes, medical history, medical clearance note , and a UA. Additionally, IVC is preferred due to safety concerns.    Glennie Isle, MSW, [facility] [place] Phone: [phone] Disposition/TOC

## 2021-10-14 NOTE — Progress Notes (Signed)
CSW spoke with Marzetta Board with [facility] for [facility]. The MD notes was requested. CSW faxed requested information for further review.   Glennie Isle, MSW, [facility] [place] Phone: [phone] Disposition/TOC

## 2021-10-14 NOTE — ED Notes (Signed)
Patient given discharge instructions, all questions answered. Patient in possession of all belongings, directed to the discharge area  

## 2021-10-14 NOTE — ED Provider Notes (Signed)
1:13 PM Have been informed that patient is now psychiatric clear for discharge home.  They do not feel she meets inpatient criteria now and feel she needs to be discharged for outpatient follow-up.  IVC will be rescinded and patient will be discharged.  She is awaiting transition of care recommendations as well.  I assessed patient and she agrees with plan of care with discharge.  IVC was rescinded.  She denies any complaints and understands follow-up instructions.   Clinical Impression: 1. Suicidal ideation     Disposition: Discharge  Condition: Good  I have discussed the results, Dx and Tx plan with the pt(& family if present). He/she/they expressed understanding and agree(s) with the plan. Discharge instructions discussed at great length. Strict return precautions discussed and pt &/or family have verbalized understanding of the instructions. No further questions at time of discharge.    New Prescriptions   No medications on file    Follow Up: [place] [id number] [phone]  Call For urgent needs, [facility] crisis assistance can be reached at the number below. Reach out to your ACT team representitive during business hours for additional support. Member Services: [phone] [place]: [phone]     Ishana Blades, Gwenyth Allegra, MD 10/14/21 [phone]

## 2021-10-14 NOTE — Discharge Instructions (Signed)
  Member Services: [phone] [place]: [phone]

## 2021-10-14 NOTE — ED Notes (Addendum)
Patient tried to leave facility running out of room 50 was able to make it out of the purple pod but apprehended by security with no issue and brought back to room 50. Patient was given 2 mg of Ativan IM due to increased agitation, yelling, banging head against the door and banging fists on bed. Continuing to evaluate this situation.

## 2021-10-14 NOTE — Consult Note (Signed)
Telepsych Consultation   Reason for Consult:  Psychiatric Reassessment Referring Physician:  EDP Location of Patient:    [name] Location of Provider: Other: Virtual Home Office  Patient Identification: [name] MRN:  [id number] Principal Diagnosis: Suicide attempt by cutting of wrist [facility]) Diagnosis:  Principal Problem:   Suicide attempt by cutting of wrist ([place]) Active Problems:   Borderline personality disorder ([place])   Asperger syndrome   Overdose   Adjustment disorder with mixed disturbance of emotions and conduct   Bipolar I disorder, current or most recent episode depressed, with psychotic features ([place])   Self-injurious behavior   Bipolar 1 disorder, depressed, severe ([place])   Total Time spent with patient: 30 minutes  Subjective:   [name] is a 31 year old female patient who has a pmhx of Asperger's Syndrome, Post traumatic Stress Disorder, Anxiety, Schizoaffective bipolar type and Borderline Personality Disorder. Patient was admitted with suicidal behaviors, pt cut her left wrist with verbalized suicide attempt.   HPI:   Patient seen via telepsych by this provider; chart reviewed and consulted with Dr. Caswell Corwin on 10/14/21.  On evaluation [name] reports she has not had suicidal thoughts since Wednesday morning.  States now states she never wanted to end her life, "I cut my wrist for attention, that's it.  I have a child and family to live for.  I will never do this or come here again.  They don't feed me enough here."    Since admissions patient has been restarted on her medications and faxed out for psychiatric inpatient facilities.  It has been 5 days since admission, she's remained in the emergency department followed psychiatry while awaiting inpatient acceptance. On assessment today, Pt now states when she cut her wrist it was not a suicide attempt, rather an attempt to get attention from her ex boyfriend whom she lives  with.  She has a history of cutting behaviors, usually cutes for stress relief.  Per admission notes pt cut to left wrist was superficial and not significant enough to end her life.   Pt is clear and coherent today, denies suicidal or homicidal ideations, audible or visual hallucinations and she contracts for safety.  She appears at baseline, no psychotic symptoms observed.  Additionally, pt has voluntarily presented to the emergency department 13 times within the past 5 months; 3 of which resulted in inpatient psychiatric admissions most recent was 2 weeks prior.  Given this, pt continues to demonstrate poor coping skills and is chronically suicidal(when triggered by relationship concerns), most of which is questionable for borderline concerns and secondary gain.  In the past, she has not benefited from inpatient admissions.  Patient is well connected with outpatient resources, has ACTT with [facility] in [place].  Additionally, pt is capable of accessing emergent care when needed.  Patient reports she has a 101 year old daughter as a strong protective factor from self harm.    During evaluation [name] is sitting on hospital bed; She is alert/oriented x 4; calm/cooperative; and mood congruent with affect.  Patient is speaking in a clear tone at moderate volume, and normal pace; with good eye contact.  Her thought process is coherent and relevant; There is no indication that she is currently responding to internal/external stimuli or experiencing delusional thought content.  Patient denies suicidal/self-harm/homicidal ideation, psychosis, and paranoia.  Patient has remained calm throughout assessment and has answered questions appropriately.   Past Psychiatric History: Pt has hx of multiple suicide  attempts and subsequent inpatient psychiatric admissions, including most recent August 2023 via overdose on prescribed medications;  pt was discharged Sept 5th. June 2023 at [facility] for suicide  attempts for a suicide via pill overdose.     Risk to Self:  no Risk to Others:  no Prior Inpatient Therapy: yes  Prior Outpatient Therapy:  yes, currently followed by Donley Redder for medication management and therapy.  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 fragile X syndrome    Chronic constipation    Depression    Drug-seeking behavior    Essential tremor    Headache    Overdose of acetaminophen 07/2017   and other meds   Personality disorder ([place])    Schizo-affective psychosis ([place])    Schizoaffective disorder, bipolar type ([place])    Seizures ([place])    Last seizure December 2017   Sleep apnea     Past Surgical History:  Procedure Laterality Date   MOUTH SURGERY  2009 or 2010   Family History:  Family History  Problem Relation Age of Onset   Mental illness Father    Asthma Father    PDD Brother    Seizures Brother    Family Psychiatric  History: unknown Social History:  Social History   Substance and Sexual Activity  Alcohol Use No   Alcohol/week: 1.0 standard drink of alcohol   Types: 1 Standard drinks or equivalent per week   Comment: denies at this time     Social History   Substance and Sexual Activity  Drug Use No   Comment: History of cocaine use at age 75 for 4 months    Social History   Socioeconomic History   Marital status: Widowed    Spouse name: Not on file   Number of children: 0   Years of education: Not on file   Highest education level: Not on file  Occupational History   Occupation: disability  Tobacco Use   Smoking status: Former    Packs/day: 0.00    Types: Cigarettes   Smokeless tobacco: Never   Tobacco comments:    Smoked for 2  years age [age]  Vaping Use   Vaping Use: Never used  Substance and Sexual Activity   Alcohol use: No    Alcohol/week: 1.0 standard drink of alcohol    Types: 1 Standard drinks  or equivalent per week    Comment: denies at this time   Drug use: No    Comment: History of cocaine use at age 62 for 4 months   Sexual activity: Not Currently    Birth control/protection: None  Other Topics Concern   Not on file  Social History Narrative   Marital status: Widowed      Children: daughter      Lives: with boyfriend, in two story home      Employment:  Disability      Tobacco: quit smoking; smoked for two years.      Alcohol ;none      Drugs: none   Has not traveled outside of the country.   Right handed         Social Determinants of Health   Financial Resource Strain: Not on file  Food Insecurity: Not on file  Transportation Needs: Not on file  Physical Activity: Not on file  Stress: Not on file  Social Connections: Not on file   Additional Social History:    Allergies:   Allergies  Allergen Reactions   Bee Venom Anaphylaxis   Coconut (Cocos Nucifera) Anaphylaxis and Rash   Coconut Flavor Anaphylaxis and Rash   Fish Allergy Anaphylaxis   Geodon [Ziprasidone Hcl] Other (See Comments)    Pt states that this medication causes paralysis of the mouth.     Haloperidol And Related Other (See Comments)    Pt states that this medication causes paralysis of the mouth, jaw locks up   Lithobid [Lithium] Other (See Comments)    Seizure-like activity    Roxicodone [Oxycodone] Other (See Comments)    Hallucinations    Seroquel [Quetiapine] Other (See Comments)    Severe drowsiness Pt currently taking 50 mg, she is ok with this strength, she does not want higher dose.   Shellfish Allergy Anaphylaxis   Phenergan [Promethazine Hcl] Other (See Comments)    Chest pain     Prilosec [Omeprazole] Nausea And Vomiting   Sulfa Antibiotics Other (See Comments)    Chest pain    Tegretol [Carbamazepine] Nausea And Vomiting   Prozac [Fluoxetine] Other (See Comments)    Increased Depression Suicidal thoughts   Tape Other (See Comments)    Skin tears, can only  tolerate paper tape.   Tylenol [Acetaminophen] Other (See Comments)    Unknown reaction    Labs:  Results for orders placed or performed during the hospital encounter of 10/11/21 (from the past 48 hour(s))  I-Stat beta hCG blood, ED     Status: None   Collection Time: 10/12/21  5:37 PM  Result Value Ref Range   I-stat hCG, quantitative <5.0 <5 mIU/mL   Comment 3            Comment:   GEST. AGE      CONC.  (mIU/mL)   <=1 WEEK        5 - 50     2 WEEKS       50 - 500     3 WEEKS       100 - 10,000     4 WEEKS     1,000 - 30,000        FEMALE AND NON-PREGNANT FEMALE:     LESS THAN 5 mIU/mL     Medications:  Current Facility-Administered Medications  Medication Dose Route Frequency Provider Last Rate Last Admin   albuterol (VENTOLIN HFA) 108 (90 Base) MCG/ACT inhaler 2 puff  2 puff Inhalation Q6H PRN Kommor, Madison, MD       cariprazine (VRAYLAR) capsule 3 mg  3 mg Oral Daily Marcello Fennel, PA-C   3 mg at 10/12/21 1017   cyclobenzaprine (FLEXERIL) tablet 10 mg  10 mg Oral QHS Kommor, Madison, MD   10 mg at 10/13/21 2128   escitalopram (LEXAPRO) tablet 15 mg  15 mg Oral Daily Marcello Fennel, PA-C   15 mg at 10/14/21 1761   ferrous sulfate tablet 325 mg  325 mg Oral QODAY Kommor, Madison, MD   325 mg at 10/14/21 6073   gabapentin (NEURONTIN) capsule 300 mg  300 mg Oral TID Marcello Fennel, PA-C   300 mg at 10/14/21 7106   hydrOXYzine (ATARAX) tablet 50 mg  50 mg Oral QID PRN Oneal Deputy K, DO   50 mg at 10/14/21 0815   ibuprofen (ADVIL) tablet 400 mg  400 mg Oral Q6H PRN Ottie Glazier, DO  400 mg at 10/14/21 1014   lidocaine (LIDODERM) 5 % 1 patch  1 patch Transdermal Q24H Drenda Freeze, MD   1 patch at 10/13/21 2212   LORazepam (ATIVAN) injection 2 mg  2 mg Intramuscular TID PRN Mallie Darting, NP   2 mg at 10/14/21 3976   mometasone-formoterol (DULERA) 200-5 MCG/ACT inhaler 2 puff  2 puff Inhalation BID Kommor, Madison, MD   2 puff at 10/13/21 1025    prazosin (MINIPRESS) capsule 1 mg  1 mg Oral QHS Marcello Fennel, PA-C   1 mg at 10/13/21 2213   propranolol (INDERAL) tablet 10 mg  10 mg Oral BID Kneller, Victoria K, DO   10 mg at 10/14/21 7341   QUEtiapine (SEROQUEL) tablet 12.5 mg  12.5 mg Oral Daily Mesner, Corene Cornea, MD   12.5 mg at 10/14/21 9379   QUEtiapine (SEROQUEL) tablet 25 mg  25 mg Oral QHS Mesner, Jason, MD   25 mg at 10/13/21 2128   risperiDONE (RISPERDAL M-TABS) disintegrating tablet 2 mg  2 mg Oral Q8H PRN Oneal Deputy K, DO   2 mg at 10/14/21 0815   Current Outpatient Medications  Medication Sig Dispense Refill   albuterol (VENTOLIN HFA) 108 (90 Base) MCG/ACT inhaler Inhale 2 puffs into the lungs every 6 (six) hours as needed for wheezing or shortness of breath.     budesonide-formoterol (SYMBICORT) 160-4.5 MCG/ACT inhaler Inhale 2 puffs into the lungs 2 (two) times daily.     calcium carbonate (TUMS EX) 750 MG chewable tablet Chew 1,500 mg by mouth 2 (two) times daily as needed for heartburn.     cariprazine (VRAYLAR) 3 MG capsule Take 1 capsule (3 mg total) by mouth daily. 30 capsule    cyclobenzaprine (FLEXERIL) 10 MG tablet Take 10 mg by mouth at bedtime.     diclofenac Sodium (VOLTAREN) 1 % GEL Apply 2 g topically 4 (four) times daily. 100 g 0   escitalopram (LEXAPRO) 5 MG tablet Take 3 tablets (15 mg total) by mouth daily. 30 tablet 0   ferrous sulfate 325 (65 FE) MG tablet Take 1 tablet (325 mg total) by mouth every other day. 15 tablet 0   gabapentin (NEURONTIN) 300 MG capsule Take 1 capsule (300 mg total) by mouth 3 (three) times daily for 7 days. 21 capsule 0   hydrOXYzine (VISTARIL) 25 MG capsule Take 25 mg by mouth 4 (four) times daily as needed for anxiety.     ibuprofen (ADVIL) 200 MG tablet Take 600 mg by mouth daily as needed for moderate pain or headache.     Lidocaine-Menthol (ICY HOT MAX LIDOCAINE EX) Apply 1 Application topically daily as needed (pain).     Multiple Vitamins-Minerals (EMERGEN-C IMMUNE  PO) Take 1 packet by mouth daily as needed (Immune support).     pantoprazole (PROTONIX) 40 MG tablet TAKE 1 TABLET BY MOUTH DAILY (Patient taking differently: 40 mg daily.) 30 tablet 0   prazosin (MINIPRESS) 1 MG capsule Take 1 capsule (1 mg total) by mouth at bedtime for 7 days. 7 capsule 0   propranolol (INDERAL) 10 MG tablet Take 1 tablet (10 mg total) by mouth 2 (two) times daily.     QUEtiapine (SEROQUEL) 25 MG tablet Take 12.5-25 mg by mouth daily. Take one-half tablet (12.5 mg) in the morning and take one tablet (25 mg) at bedtime      Musculoskeletal: pt moves all extremities and ambulates independently.  Strength & Muscle Tone: within normal limits Gait & Station:  normal Patient leans: N/A  Psychiatric Specialty Exam:  Presentation  General Appearance: Casual; Fairly Groomed  Eye Contact:Fair  Speech:Clear and Coherent  Speech Volume:Decreased  Handedness:Right   Mood and Affect  Mood:Anxious; Dysphoric; Depressed; Labile; Hopeless; Worthless  Affect:Congruent; Tearful; Labile   Thought Process  Thought Processes:Coherent; Goal Directed  Descriptions of Associations:Intact  Orientation:Full (Time, Place and Person)  Thought Content:Illogical  History of Schizophrenia/Schizoaffective disorder:No  Duration of Psychotic Symptoms:N/A  Hallucinations:No data recorded Ideas of Reference:None  Suicidal Thoughts:No data recorded Homicidal Thoughts:No data recorded  Sensorium  Memory:Immediate Good; Recent Good; Remote Good  Judgment:Poor  Insight:Poor   Executive Functions  Concentration:Fair  Attention Span:Fair  Recall:Good  Fund of Knowledge:Good  Language:Good   Psychomotor Activity  Psychomotor Activity:No data recorded  Assets  Assets:Communication Skills; Housing; Social Support   Sleep  Sleep:No data recorded   Physical Exam: Physical Exam Constitutional:      Appearance: She is obese.  Cardiovascular:     Rate and  Rhythm: Normal rate.     Pulses: Normal pulses.  Pulmonary:     Effort: Pulmonary effort is normal.  Musculoskeletal:        General: Normal range of motion.     Cervical back: Normal range of motion.  Neurological:     Mental Status: She is alert.  Psychiatric:        Attention and Perception: Attention and perception normal.        Mood and Affect: Mood and affect normal.        Speech: Speech normal.        Behavior: Behavior normal. Behavior is cooperative.        Thought Content: Thought content normal. Thought content does not include suicidal ideation. Thought content does not include suicidal plan.        Cognition and Memory: Cognition and memory normal.        Judgment: Judgment is impulsive.    Review of Systems  Constitutional: Negative.   HENT: Negative.    Eyes: Negative.   Respiratory: Negative.    Cardiovascular: Negative.   Gastrointestinal: Negative.   Genitourinary: Negative.   Musculoskeletal: Negative.   Skin: Negative.   Neurological: Negative.   Endo/Heme/Allergies: Negative.   Psychiatric/Behavioral:  Negative for hallucinations, substance abuse and suicidal ideas. The patient does not have insomnia.    Blood pressure 109/62, pulse (!) 108, temperature 98.4 F (36.9 C), temperature source Oral, resp. rate 18, height '5\' 4"'$  (1.626 m), weight 89.8 kg, SpO2 96 %. Body mass index is 33.99 kg/m.  Treatment Plan Summary: Patient with hs of Borderline Personality Disorder, Autism and Bipolar Disorder who initially presented for evaluation of suicide attempt.  Since admissions patient has been restarted on her medications and faxed out for psychiatric inpatient facilities.  It has been 5 days since admission, she's remained in the emergency department followed psychiatry while awaiting inpatient acceptance. On assessment today, Pt now states when she cut her wrist it was not a suicide attempt, rather an attempt to get attention from her ex boyfriend whom she lives  with.  She has a history of cutting behaviors, usually cutes for stress relief.  Per admission notes pt cut to left wrist was superficial and not significant enough to end her life.   Pt is clear and coherent today, denies suicidal or homicidal ideations, audible or visual hallucinations and she contracts for safety.  She appears at baseline, no psychotic symptoms observed.  Additionally, pt has voluntarily presented to the  emergency department 13 times within the past 5 months; 3 of which resulted in inpatient psychiatric admissions most recent was 2 weeks prior.  Given this, pt continues to demonstrate poor coping skills and is chronically suicidal(when triggered by relationship concerns), most of which is questionable for borderline concerns and secondary gain.  She does not benefit from inpatient admissions.  Patient is well connected with outpatient resources, has ACTT with [facility] in [place].  Recommend she follow-up in one week. Additionally, pt is capable of accessing emergent care when needed.  Patient reports she has a 42 year old daughter as a strong protective factor from self harm.    As per above, patient does not meet criteria for involuntary psychiatric admissions and states he is not interested in voluntary admissions.  He is psych cleared and recommended he follow up with outpatient resources added to discharge AVS.    Safety planning completed; Also reviewed the importance of medication compliance.     Disposition: No evidence of imminent risk to self or others at present.   Patient does not meet criteria for psychiatric inpatient admission. Supportive therapy provided about ongoing stressors. Discussed crisis plan, support from social network, calling 911, coming to the Emergency Department, and calling Suicide Hotline.  This service was provided via telemedicine using a 2-way, interactive audio and video technology.  Names of all persons participating in this telemedicine  service and their role in this encounter. Name: [name] Role: Patient  Name: Merlyn Lot Role: PMHNP   Mallie Darting, NP 10/14/2021 11:01 AM

## 2021-10-14 NOTE — Social Work (Signed)
CSW notified pt has been psych cleared. Pt is associated with [facility] in [facility]. [employer] and member services line information added to AVS. CSW unable to notify ACT team today, but put on AVS for pt to follow up with her team contact on Monday.

## 2021-10-14 NOTE — ED Notes (Signed)
Patient becoming increasingly agitated and upset stating "I want to leave, I'm worried my things will be taken". Patient tearful, stating "I'm sorry I cut myself, I'm not feeling suicidal anymore that was on Tuesday and its Saturday now". PRN anti anxiety medication given. See MAR. Patient updated on plan of care.

## 2021-10-16 ENCOUNTER — Telehealth: Payer: Self-pay

## 2021-10-16 NOTE — Telephone Encounter (Signed)
        Patient  visited [facility] on 9/9     Telephone encounter attempt :  1st  Unable to leave a message    [place], [facility]  [phone] [address], [place], [place] [address] Phone: [phone] Email: [email or web address]

## 2021-10-18 ENCOUNTER — Other Ambulatory Visit: Payer: Self-pay

## 2021-10-18 ENCOUNTER — Inpatient Hospital Stay (HOSPITAL_COMMUNITY)
Admission: AD | Admit: 2021-10-18 | Discharge: 2021-10-23 | DRG: 885 | Disposition: A | Discharge status: Home / Self Care | Payer: PPO | Source: Intra-hospital | Attending: Psychiatry | Admitting: Psychiatry

## 2021-10-18 ENCOUNTER — Encounter (HOSPITAL_COMMUNITY): Payer: Self-pay | Admitting: Psychiatry

## 2021-10-18 ENCOUNTER — Ambulatory Visit (HOSPITAL_COMMUNITY)
Admission: EM | Admit: 2021-10-18 | Discharge: 2021-10-18 | Disposition: A | Discharge status: Other Acute Inpatient Hospital | Payer: PPO | Attending: Psychiatry | Admitting: Psychiatry

## 2021-10-18 DIAGNOSIS — Z825 Family history of asthma and other chronic lower respiratory diseases: Secondary | ICD-10-CM | POA: Diagnosis not present

## 2021-10-18 DIAGNOSIS — Z91013 Allergy to seafood: Secondary | ICD-10-CM | POA: Diagnosis not present

## 2021-10-18 DIAGNOSIS — R45851 Suicidal ideations: Secondary | ICD-10-CM

## 2021-10-18 DIAGNOSIS — F603 Borderline personality disorder: Secondary | ICD-10-CM | POA: Diagnosis not present

## 2021-10-18 DIAGNOSIS — Z91030 Bee allergy status: Secondary | ICD-10-CM

## 2021-10-18 DIAGNOSIS — Z808 Family history of malignant neoplasm of other organs or systems: Secondary | ICD-10-CM | POA: Diagnosis not present

## 2021-10-18 DIAGNOSIS — Z833 Family history of diabetes mellitus: Secondary | ICD-10-CM | POA: Diagnosis not present

## 2021-10-18 DIAGNOSIS — G250 Essential tremor: Secondary | ICD-10-CM | POA: Diagnosis present

## 2021-10-18 DIAGNOSIS — Z888 Allergy status to other drugs, medicaments and biological substances status: Secondary | ICD-10-CM | POA: Diagnosis not present

## 2021-10-18 DIAGNOSIS — F840 Autistic disorder: Secondary | ICD-10-CM | POA: Diagnosis present

## 2021-10-18 DIAGNOSIS — K219 Gastro-esophageal reflux disease without esophagitis: Secondary | ICD-10-CM | POA: Diagnosis not present

## 2021-10-18 DIAGNOSIS — Z91410 Personal history of adult physical and sexual abuse: Secondary | ICD-10-CM | POA: Diagnosis not present

## 2021-10-18 DIAGNOSIS — F4310 Post-traumatic stress disorder, unspecified: Secondary | ICD-10-CM | POA: Diagnosis not present

## 2021-10-18 DIAGNOSIS — Z882 Allergy status to sulfonamides status: Secondary | ICD-10-CM | POA: Diagnosis not present

## 2021-10-18 DIAGNOSIS — Z886 Allergy status to analgesic agent status: Secondary | ICD-10-CM | POA: Diagnosis not present

## 2021-10-18 DIAGNOSIS — Z91018 Allergy to other foods: Secondary | ICD-10-CM

## 2021-10-18 DIAGNOSIS — Z79899 Other long term (current) drug therapy: Secondary | ICD-10-CM

## 2021-10-18 DIAGNOSIS — E611 Iron deficiency: Secondary | ICD-10-CM | POA: Diagnosis not present

## 2021-10-18 DIAGNOSIS — F411 Generalized anxiety disorder: Secondary | ICD-10-CM | POA: Diagnosis not present

## 2021-10-18 DIAGNOSIS — F250 Schizoaffective disorder, bipolar type: Principal | ICD-10-CM | POA: Diagnosis present

## 2021-10-18 DIAGNOSIS — F32A Depression, unspecified: Secondary | ICD-10-CM | POA: Diagnosis present

## 2021-10-18 DIAGNOSIS — Z20822 Contact with and (suspected) exposure to covid-19: Secondary | ICD-10-CM | POA: Diagnosis not present

## 2021-10-18 DIAGNOSIS — Z87891 Personal history of nicotine dependence: Secondary | ICD-10-CM | POA: Diagnosis not present

## 2021-10-18 DIAGNOSIS — Z818 Family history of other mental and behavioral disorders: Secondary | ICD-10-CM

## 2021-10-18 DIAGNOSIS — M797 Fibromyalgia: Secondary | ICD-10-CM | POA: Diagnosis present

## 2021-10-18 LAB — RESP PANEL BY RT-PCR (FLU A&B, COVID) ARPGX2
Influenza A by PCR: NEGATIVE
Influenza B by PCR: NEGATIVE
SARS Coronavirus 2 by RT PCR: NEGATIVE

## 2021-10-18 LAB — POCT PREGNANCY, URINE: Preg Test, Ur: NEGATIVE

## 2021-10-18 LAB — POCT URINE DRUG SCREEN - MANUAL ENTRY (I-SCREEN)
POC Amphetamine UR: NOT DETECTED
POC Buprenorphine (BUP): NOT DETECTED
POC Cocaine UR: NOT DETECTED
POC Marijuana UR: NOT DETECTED
POC Methadone UR: NOT DETECTED
POC Methamphetamine UR: NOT DETECTED
POC Morphine: NOT DETECTED
POC Oxazepam (BZO): NOT DETECTED
POC Oxycodone UR: NOT DETECTED
POC Secobarbital (BAR): NOT DETECTED

## 2021-10-18 LAB — POC SARS CORONAVIRUS 2 AG: SARSCOV2ONAVIRUS 2 AG: NEGATIVE

## 2021-10-18 MED ORDER — IBUPROFEN 600 MG PO TABS
600.0000 mg | ORAL_TABLET | Freq: Every day | ORAL | Status: DC | PRN
  Administered 2021-10-19 – 2021-10-22 (×5): 600 mg via ORAL
  Filled 2021-10-18 (×5): qty 1

## 2021-10-18 MED ORDER — IBUPROFEN 600 MG PO TABS
600.0000 mg | ORAL_TABLET | Freq: Every day | ORAL | Status: DC | PRN
  Administered 2021-10-18: 600 mg via ORAL
  Filled 2021-10-18: qty 1

## 2021-10-18 MED ORDER — CYCLOBENZAPRINE HCL 10 MG PO TABS: 10.0000 mg | ORAL_TABLET | Freq: Every day | ORAL | Status: DC

## 2021-10-18 MED ORDER — CALCIUM CARBONATE ANTACID 500 MG PO CHEW
1500.0000 mg | CHEWABLE_TABLET | Freq: Two times a day (BID) | ORAL | Status: DC | PRN
  Administered 2021-10-22 – 2021-10-23 (×2): 1500 mg via ORAL
  Filled 2021-10-18: qty 8

## 2021-10-18 MED ORDER — CARIPRAZINE HCL 3 MG PO CAPS: 3.0000 mg | ORAL_CAPSULE | Freq: Every day | ORAL | Status: DC

## 2021-10-18 MED ORDER — ALUM & MAG HYDROXIDE-SIMETH 200-200-20 MG/5ML PO SUSP
30.0000 mL | ORAL | Status: DC | PRN
  Administered 2021-10-20: 30 mL via ORAL
  Filled 2021-10-18: qty 30

## 2021-10-18 MED ORDER — ESCITALOPRAM OXALATE 5 MG PO TABS
15.0000 mg | ORAL_TABLET | Freq: Every day | ORAL | Status: DC
  Administered 2021-10-19 – 2021-10-23 (×5): 15 mg via ORAL
  Filled 2021-10-18 (×7): qty 1

## 2021-10-18 MED ORDER — HYDROXYZINE HCL 25 MG PO TABS
25.0000 mg | ORAL_TABLET | Freq: Three times a day (TID) | ORAL | Status: DC | PRN
  Administered 2021-10-20 – 2021-10-22 (×4): 25 mg via ORAL
  Filled 2021-10-18 (×4): qty 1

## 2021-10-18 MED ORDER — PROPRANOLOL HCL 10 MG PO TABS
10.0000 mg | ORAL_TABLET | Freq: Two times a day (BID) | ORAL | Status: DC
  Administered 2021-10-18 – 2021-10-23 (×10): 10 mg via ORAL
  Filled 2021-10-18 (×15): qty 1

## 2021-10-18 MED ORDER — QUETIAPINE FUMARATE 25 MG PO TABS: 12.5000 mg | ORAL_TABLET | Freq: Every morning | ORAL | Status: DC

## 2021-10-18 MED ORDER — MAGNESIUM HYDROXIDE 400 MG/5ML PO SUSP: 30.0000 mL | Freq: Every day | ORAL | Status: DC | PRN

## 2021-10-18 MED ORDER — FLUTICASONE FUROATE-VILANTEROL 200-25 MCG/ACT IN AEPB
1.0000 | INHALATION_SPRAY | Freq: Every day | RESPIRATORY_TRACT | Status: DC
  Filled 2021-10-18: qty 28

## 2021-10-18 MED ORDER — PRAZOSIN HCL 1 MG PO CAPS: 1.0000 mg | ORAL_CAPSULE | Freq: Every day | ORAL | Status: DC

## 2021-10-18 MED ORDER — CALCIUM CARBONATE ANTACID 500 MG PO CHEW: 1500.0000 mg | CHEWABLE_TABLET | Freq: Two times a day (BID) | ORAL | Status: DC | PRN

## 2021-10-18 MED ORDER — ALBUTEROL SULFATE HFA 108 (90 BASE) MCG/ACT IN AERS
2.0000 | INHALATION_SPRAY | Freq: Four times a day (QID) | RESPIRATORY_TRACT | Status: DC | PRN
  Filled 2021-10-18: qty 6.7

## 2021-10-18 MED ORDER — FERROUS SULFATE 325 (65 FE) MG PO TABS
325.0000 mg | ORAL_TABLET | ORAL | Status: DC
  Administered 2021-10-20 – 2021-10-22 (×2): 325 mg via ORAL
  Filled 2021-10-18 (×3): qty 1

## 2021-10-18 MED ORDER — ESCITALOPRAM OXALATE 5 MG PO TABS: 15.0000 mg | ORAL_TABLET | Freq: Every day | ORAL | Status: DC

## 2021-10-18 MED ORDER — FLUTICASONE FUROATE-VILANTEROL 200-25 MCG/ACT IN AEPB
1.0000 | INHALATION_SPRAY | Freq: Every day | RESPIRATORY_TRACT | Status: DC
  Administered 2021-10-19 – 2021-10-23 (×5): 1 via RESPIRATORY_TRACT
  Filled 2021-10-18: qty 28

## 2021-10-18 MED ORDER — QUETIAPINE 12.5 MG HALF TABLET
12.5000 mg | ORAL_TABLET | Freq: Every morning | ORAL | Status: DC
  Administered 2021-10-19 – 2021-10-23 (×5): 12.5 mg via ORAL
  Filled 2021-10-18 (×7): qty 1

## 2021-10-18 MED ORDER — GABAPENTIN 300 MG PO CAPS
300.0000 mg | ORAL_CAPSULE | Freq: Three times a day (TID) | ORAL | Status: DC
  Administered 2021-10-19 (×2): 300 mg via ORAL
  Filled 2021-10-18 (×7): qty 1

## 2021-10-18 MED ORDER — ALBUTEROL SULFATE HFA 108 (90 BASE) MCG/ACT IN AERS: 2.0000 | INHALATION_SPRAY | Freq: Four times a day (QID) | RESPIRATORY_TRACT | Status: DC | PRN

## 2021-10-18 MED ORDER — CARIPRAZINE HCL 3 MG PO CAPS
3.0000 mg | ORAL_CAPSULE | Freq: Every day | ORAL | Status: DC
  Administered 2021-10-19 – 2021-10-23 (×5): 3 mg via ORAL
  Filled 2021-10-18 (×7): qty 1

## 2021-10-18 MED ORDER — PRAZOSIN HCL 1 MG PO CAPS
1.0000 mg | ORAL_CAPSULE | Freq: Every day | ORAL | Status: DC
  Administered 2021-10-18 – 2021-10-22 (×5): 1 mg via ORAL
  Filled 2021-10-18 (×9): qty 1

## 2021-10-18 MED ORDER — CYCLOBENZAPRINE HCL 10 MG PO TABS
10.0000 mg | ORAL_TABLET | Freq: Every day | ORAL | Status: DC
  Administered 2021-10-18 – 2021-10-22 (×5): 10 mg via ORAL
  Filled 2021-10-18 (×9): qty 1

## 2021-10-18 MED ORDER — PROPRANOLOL HCL 10 MG PO TABS: 10.0000 mg | ORAL_TABLET | Freq: Two times a day (BID) | ORAL | Status: DC

## 2021-10-18 MED ORDER — QUETIAPINE FUMARATE 25 MG PO TABS: 25.0000 mg | ORAL_TABLET | Freq: Every day | ORAL | Status: DC

## 2021-10-18 MED ORDER — GABAPENTIN 300 MG PO CAPS
300.0000 mg | ORAL_CAPSULE | Freq: Three times a day (TID) | ORAL | Status: DC
  Administered 2021-10-18: 300 mg via ORAL
  Filled 2021-10-18: qty 1

## 2021-10-18 MED ORDER — HYDROXYZINE HCL 25 MG PO TABS
25.0000 mg | ORAL_TABLET | Freq: Three times a day (TID) | ORAL | Status: DC | PRN
  Administered 2021-10-18: 25 mg via ORAL
  Filled 2021-10-18: qty 1

## 2021-10-18 MED ORDER — QUETIAPINE FUMARATE 25 MG PO TABS
25.0000 mg | ORAL_TABLET | Freq: Every day | ORAL | Status: DC
  Administered 2021-10-18 – 2021-10-22 (×5): 25 mg via ORAL
  Filled 2021-10-18 (×10): qty 1

## 2021-10-18 MED ORDER — ALUM & MAG HYDROXIDE-SIMETH 200-200-20 MG/5ML PO SUSP: 30.0000 mL | ORAL | Status: DC | PRN

## 2021-10-18 MED ORDER — FERROUS SULFATE 325 (65 FE) MG PO TABS
325.0000 mg | ORAL_TABLET | ORAL | Status: DC
  Administered 2021-10-18: 325 mg via ORAL
  Filled 2021-10-18: qty 1

## 2021-10-18 NOTE — ED Notes (Signed)
Patient Alert and oriented x 4. Discharged with her belongings to [facility]. Report given to [facility], Therapist, sports. Patient transferred via safe transport.

## 2021-10-18 NOTE — Progress Notes (Signed)
Pt was accepted to [facility] 10/18/21; Bed Assignment 306-1  Pt meets inpatient criteria per Elvin So, NP,   Attending Physician will be Dr. Caswell Corwin  Report can be called to: - Child and Adolescence unit: [phone] -Adult unit: [phone]  Pt can arrive: Night [facility] AC to coordinate   Care Team notified: [facility] Lynnda Shields, RN, Roanna Banning, LPN, Glyn Ade, RN, Elvin So, NP, Mariea Clonts, LCSWA, and Melba Coon, MD  Nadara Mode, [place] 10/18/2021 @ 4:37 PM

## 2021-10-18 NOTE — ED Notes (Signed)
Patient Voluntarily admitted to [facility]. A&Ox4. Vs stable. Independent with ADLS. Steady gait. Patient currently endorsing SI with "plan to overdose on medication or bang my head on glass."  Patient also endorses auditory hallucinations that tell her "shhh" "shut up" and to kill herself. Patient able to contract for safety while on unit and is cooperative/calm. Skin check completed and is WDL with the exception of superficial cuts to arms bilaterally. L arm cuts made with razor and R arm cuts made with fingernails. Patient oriented to unit. Food/fluids offered. Patient remains safe on unit at this time.

## 2021-10-18 NOTE — Tx Team (Signed)
Initial Treatment Plan 10/18/2021 11:15 PM [name] CVE:[id number]    PATIENT STRESSORS: Legal issue   Marital or family conflict     PATIENT STRENGTHS: Capable of independent living  General fund of knowledge  Supportive family/friends  Work skills    PATIENT IDENTIFIED PROBLEMS: Hallucinations distracting her while at work  Ineffective coping skills  Self harm thoughts and behaviors                 DISCHARGE CRITERIA:  Improved stabilization in mood, thinking, and/or behavior Reduction of life-threatening or endangering symptoms to within safe limits Verbal commitment to aftercare and medication compliance  PRELIMINARY DISCHARGE PLAN: Outpatient therapy Return to previous living arrangement Return to previous work or school arrangements  PATIENT/FAMILY INVOLVEMENT: This treatment plan has been presented to and reviewed with the patient, [name]. The patient has been given the opportunity to ask questions and make suggestions.  Azucena Cecil, RN 10/18/2021, 11:15 PM
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
## 2021-10-18 NOTE — ED Provider Notes (Addendum)
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          Final   Comment:   GEST. AGE      CONC.  (mIU/mL)   <=1 WEEK        5 - 50     2  WEEKS       50 - 500     3 WEEKS       100 - 10,000     4 WEEKS     1,000 - 30,000        FEMALE AND NON-PREGNANT FEMALE:     LESS THAN 5 mIU/mL    Acetaminophen (Tylenol), Serum 10/05/2021 <10 (L)  10 - 30 ug/mL Final   Comment: (NOTE) Therapeutic concentrations vary significantly. A range of 10-30 ug/mL  may be an effective concentration for many patients. However, some  are best treated at concentrations outside of this range. Acetaminophen concentrations >150 ug/mL at 4 hours after ingestion  and >50 ug/mL at 12 hours after ingestion are often associated with  toxic reactions.  Performed at [facility], [place] [address], [place],  [address]    SARS Coronavirus 2 by RT PCR 10/05/2021 NEGATIVE  NEGATIVE Final   Comment: (NOTE) SARS-CoV-2 target nucleic acids are NOT DETECTED.  The SARS-CoV-2 RNA is generally detectable in upper respiratory specimens during the acute phase of infection. The lowest concentration of SARS-CoV-2 viral copies this assay can detect is 138 copies/mL. A negative result does not preclude SARS-Cov-2 infection and should not be used as the sole basis for treatment or other patient management decisions. A negative result may occur with  improper specimen collection/handling, submission of specimen other than nasopharyngeal swab, presence of viral mutation(s) within the areas targeted by this assay, and inadequate number of viral copies(<138 copies/mL). A negative result must be combined with clinical observations, patient history, and epidemiological information. The expected result is Negative.  Fact Sheet for Patients:  EntrepreneurPulse.com.au  Fact Sheet for Healthcare Providers:  IncredibleEmployment.be  This test is no                          t yet approved or cleared by the [place] FDA and  has been authorized for detection and/or diagnosis of SARS-CoV-2 by FDA under an Emergency Use  Authorization (EUA). This EUA will remain  in effect (meaning this test can be used) for the duration of the COVID-19 declaration under Section 564(b)(1) of the Act, 21 U.S.C.section 360bbb-3(b)(1), unless the authorization is terminated  or revoked sooner.       Influenza A by PCR 10/05/2021 NEGATIVE  NEGATIVE Final   Influenza B by PCR 10/05/2021 NEGATIVE  NEGATIVE Final   Comment: (NOTE) The Xpert Xpress SARS-CoV-2/FLU/RSV plus assay is intended as an aid in the diagnosis of influenza from Nasopharyngeal swab specimens and should not be used as a sole basis for treatment. Nasal washings and aspirates are unacceptable for Xpert Xpress SARS-CoV-2/FLU/RSV testing.  Fact Sheet for Patients: EntrepreneurPulse.com.au  Fact Sheet for Healthcare Providers: IncredibleEmployment.be  This test is not yet approved or cleared by the [place] FDA and has been authorized for detection and/or  diagnosis of SARS-CoV-2 by FDA under an Emergency Use Authorization (EUA). This EUA will remain in effect (meaning this test can be used) for the duration of the COVID-19 declaration under Section 564(b)(1) of the Act, 21 U.S.C. section 360bbb-3(b)(1), unless the authorization is terminated or revoked.  Performed at [facility], [place] [address] [place], [place] [address]   Admission on 09/02/2021, Discharged on 09/04/2021  Component Date Value Ref Range Status   SARS Coronavirus 2 by RT PCR 09/02/2021 NEGATIVE  NEGATIVE Final   Comment: (NOTE) SARS-CoV-2 target nucleic acids are NOT DETECTED.  The SARS-CoV-2 RNA is generally detectable in upper respiratory specimens during the acute phase of infection. The lowest concentration of SARS-CoV-2 viral copies this assay can detect is 138 copies/mL. A negative result does not preclude SARS-Cov-2 infection and should not be used as the sole basis for treatment or other patient management decisions. A  negative result may occur with  improper specimen collection/handling, submission of specimen other than nasopharyngeal swab, presence of viral mutation(s) within the areas targeted by this assay, and inadequate number of viral copies(<138 copies/mL). A negative result must be combined with clinical observations, patient history, and epidemiological information. The expected result is Negative.  Fact Sheet for Patients:  EntrepreneurPulse.com.au  Fact Sheet for Healthcare Providers:  IncredibleEmployment.be  This test is no                          t yet approved or cleared by the [place] FDA and  has been authorized for detection and/or diagnosis of SARS-CoV-2 by FDA under an Emergency Use Authorization (EUA). This EUA will remain  in effect (meaning this test can be used) for the duration of the COVID-19 declaration under Section 564(b)(1) of the Act, 21 U.S.C.section 360bbb-3(b)(1), unless the authorization is terminated  or revoked sooner.       Influenza A by PCR 09/02/2021 NEGATIVE  NEGATIVE Final   Influenza B by PCR 09/02/2021 NEGATIVE  NEGATIVE Final   Comment: (NOTE) The Xpert Xpress SARS-CoV-2/FLU/RSV plus assay is intended as an aid in the diagnosis of influenza from Nasopharyngeal swab specimens and should not be used as a sole basis for treatment. Nasal washings and aspirates are unacceptable for Xpert Xpress SARS-CoV-2/FLU/RSV testing.  Fact Sheet for Patients: EntrepreneurPulse.com.au  Fact Sheet for Healthcare Providers: IncredibleEmployment.be  This test is not yet approved or cleared by the [place] FDA and has been authorized for detection and/or diagnosis of SARS-CoV-2 by FDA under an Emergency Use Authorization (EUA). This EUA will remain in effect (meaning this test can be used) for the duration of the COVID-19 declaration under Section 564(b)(1) of the Act, 21  U.S.C. section 360bbb-3(b)(1), unless the authorization is terminated or revoked.  Performed at [facility], [place] [address], [place], [place] [address]    WBC 09/02/2021 8.5  4.0 - 10.5 K/uL Final   RBC 09/02/2021 4.76  3.87 - 5.11 MIL/uL Final   Hemoglobin 09/02/2021 12.4  12.0 - 15.0 g/dL Final   HCT 09/02/2021 38.4  36.0 - 46.0 % Final   MCV 09/02/2021 80.7  80.0 - 100.0 fL Final   MCH 09/02/2021 26.1  26.0 - 34.0 pg Final   MCHC 09/02/2021 32.3  30.0 - 36.0 g/dL Final   RDW 09/02/2021 13.2  11.5 - 15.5 % Final   Platelets 09/02/2021 295  150 - 400 K/uL Final   nRBC 09/02/2021 0.0  0.0 - 0.2 %  Final   Neutrophils Relative % 09/02/2021 56  % Final   Neutro Abs 09/02/2021 4.8  1.7 - 7.7 K/uL Final   Lymphocytes Relative 09/02/2021 36  % Final   Lymphs Abs 09/02/2021 3.1  0.7 - 4.0 K/uL Final   Monocytes Relative 09/02/2021 6  % Final   Monocytes Absolute 09/02/2021 0.5  0.1 - 1.0 K/uL Final   Eosinophils Relative 09/02/2021 1  % Final   Eosinophils Absolute 09/02/2021 0.1  0.0 - 0.5 K/uL Final   Basophils Relative 09/02/2021 1  % Final   Basophils Absolute 09/02/2021 0.0  0.0 - 0.1 K/uL Final   Immature Granulocytes 09/02/2021 0  % Final   Abs Immature Granulocytes 09/02/2021 0.02  0.00 - 0.07 K/uL Final   Performed at [facility], [place] [address], [place], [place] [address]   Sodium 09/02/2021 137  135 - 145 mmol/L Final   Potassium 09/02/2021 3.6  3.5 - 5.1 mmol/L Final   Chloride 09/02/2021 102  98 - 111 mmol/L Final   CO2 09/02/2021 28  22 - 32 mmol/L Final   Glucose, Bld 09/02/2021 92  70 - 99 mg/dL Final   Glucose reference range applies only to samples taken after fasting for at least 8 hours.   BUN 09/02/2021 6  6 - 20 mg/dL Final   Creatinine, Ser 09/02/2021 0.72  0.44 - 1.00 mg/dL Final   Calcium 09/02/2021 10.1  8.9 - 10.3 mg/dL Final   Total Protein 09/02/2021 7.5  6.5 - 8.1 g/dL Final   Albumin 09/02/2021 4.2  3.5 - 5.0 g/dL Final   AST 09/02/2021  17  15 - 41 U/L Final   ALT 09/02/2021 16  0 - 44 U/L Final   Alkaline Phosphatase 09/02/2021 70  38 - 126 U/L Final   Total Bilirubin 09/02/2021 0.4  0.3 - 1.2 mg/dL Final   GFR, Estimated 09/02/2021 >60  >60 mL/min Final   Comment: (NOTE) Calculated using the CKD-EPI Creatinine Equation (2021)    Anion gap 09/02/2021 7  5 - 15 Final   Performed at [place] [address], [place], [place] [address]   Hgb A1c MFr Bld 09/02/2021 5.0  4.8 - 5.6 % Final   Comment: (NOTE) Pre diabetes:          5.7%-6.4%  Diabetes:              >6.4%  Glycemic control for   <7.0% adults with diabetes    Mean Plasma Glucose 09/02/2021 96.8  mg/dL Final   Performed at [place] [address], [place], [place] [address]   TSH 09/02/2021 2.382  0.350 - 4.500 uIU/mL Final   Comment: Performed by a 3rd Generation assay with a functional sensitivity of <=0.01 uIU/mL. Performed at [facility], [place] [address] [place], [place] [address]    POC Amphetamine UR 09/02/2021 None Detected  NONE DETECTED (Cut Off Level 1000 ng/mL) Preliminary   POC Secobarbital (BAR) 09/02/2021 None Detected  NONE DETECTED (Cut Off Level 300 ng/mL) Preliminary   POC Buprenorphine (BUP) 09/02/2021 None Detected  NONE DETECTED (Cut Off Level 10 ng/mL) Preliminary   POC Oxazepam (BZO) 09/02/2021 None Detected  NONE DETECTED (Cut Off Level 300 ng/mL) Preliminary   POC Cocaine UR 09/02/2021 None Detected  NONE DETECTED (Cut Off Level 300 ng/mL) Preliminary   POC Methamphetamine UR 09/02/2021 None Detected  NONE DETECTED (Cut Off Level 1000 ng/mL) Preliminary   POC Morphine 09/02/2021 None  Detected  NONE DETECTED (Cut Off Level 300 ng/mL) Preliminary   POC Methadone UR 09/02/2021 None Detected  NONE DETECTED (Cut Off Level 300 ng/mL) Preliminary   POC Oxycodone UR 09/02/2021 None Detected  NONE DETECTED (Cut Off Level 100 ng/mL) Preliminary   POC Marijuana UR 09/02/2021 None Detected  NONE DETECTED (Cut Off  Level 50 ng/mL) Preliminary   SARSCOV2ONAVIRUS 2 AG 09/02/2021 NEGATIVE  NEGATIVE Final   Comment: (NOTE) SARS-CoV-2 antigen NOT DETECTED.   Negative results are presumptive.  Negative results do not preclude SARS-CoV-2 infection and should not be used as the sole basis for treatment or other patient management decisions, including infection  control decisions, particularly in the presence of clinical signs and  symptoms consistent with COVID-19, or in those who have been in contact with the virus.  Negative results must be combined with clinical observations, patient history, and epidemiological information. The expected result is Negative.  Fact Sheet for Patients: HandmadeRecipes.com.cy  Fact Sheet for Healthcare Providers: FuneralLife.at  This test is not yet approved or cleared by the [place] FDA and  has been authorized for detection and/or diagnosis of SARS-CoV-2 by FDA under an Emergency Use Authorization (EUA).  This EUA will remain in effect (meaning this test can be used) for the duration of  the COV                          ID-19 declaration under Section 564(b)(1) of the Act, 21 U.S.C. section 360bbb-3(b)(1), unless the authorization is terminated or revoked sooner.     Preg Test, Ur 09/02/2021 NEGATIVE  NEGATIVE Final   Comment:        THE SENSITIVITY OF THIS METHODOLOGY IS >24 mIU/mL   Admission on 08/02/2021, Discharged on 08/03/2021  Component Date Value Ref Range Status   SARS Coronavirus 2 by RT PCR 08/03/2021 NEGATIVE  NEGATIVE Final   Comment: (NOTE) SARS-CoV-2 target nucleic acids are NOT DETECTED.  The SARS-CoV-2 RNA is generally detectable in upper respiratory specimens during the acute phase of infection. The lowest concentration of SARS-CoV-2 viral copies this assay can detect is 138 copies/mL. A negative result does not preclude SARS-Cov-2 infection and should not be used as the sole basis for  treatment or other patient management decisions. A negative result may occur with  improper specimen collection/handling, submission of specimen other than nasopharyngeal swab, presence of viral mutation(s) within the areas targeted by this assay, and inadequate number of viral copies(<138 copies/mL). A negative result must be combined with clinical observations, patient history, and epidemiological information. The expected result is Negative.  Fact Sheet for Patients:  EntrepreneurPulse.com.au  Fact Sheet for Healthcare Providers:  IncredibleEmployment.be  This test is no                          t yet approved or cleared by the [place] FDA and  has been authorized for detection and/or diagnosis of SARS-CoV-2 by FDA under an Emergency Use Authorization (EUA). This EUA will remain  in effect (meaning this test can be used) for the duration of the COVID-19 declaration under Section 564(b)(1) of the Act, 21 U.S.C.section 360bbb-3(b)(1), unless the authorization is terminated  or revoked sooner.       Influenza A by PCR 08/03/2021 NEGATIVE  NEGATIVE Final   Influenza B by PCR 08/03/2021 NEGATIVE  NEGATIVE Final   Comment: (NOTE) The Xpert Xpress SARS-CoV-2/FLU/RSV plus assay is intended as an  aid in the diagnosis of influenza from Nasopharyngeal swab specimens and should not be used as a sole basis for treatment. Nasal washings and aspirates are unacceptable for Xpert Xpress SARS-CoV-2/FLU/RSV testing.  Fact Sheet for Patients: EntrepreneurPulse.com.au  Fact Sheet for Healthcare Providers: IncredibleEmployment.be  This test is not yet approved or cleared by the [place] FDA and has been authorized for detection and/or diagnosis of SARS-CoV-2 by FDA under an Emergency Use Authorization (EUA). This EUA will remain in effect (meaning this test can be used) for the duration of the COVID-19  declaration under Section 564(b)(1) of the Act, 21 U.S.C. section 360bbb-3(b)(1), unless the authorization is terminated or revoked.  Performed at [facility], [place] [address] [place], [place] [address]    WBC 08/02/2021 9.1  4.0 - 10.5 K/uL Final   RBC 08/02/2021 4.82  3.87 - 5.11 MIL/uL Final   Hemoglobin 08/02/2021 12.8  12.0 - 15.0 g/dL Final   HCT 08/02/2021 38.8  36.0 - 46.0 % Final   MCV 08/02/2021 80.5  80.0 - 100.0 fL Final   MCH 08/02/2021 26.6  26.0 - 34.0 pg Final   MCHC 08/02/2021 33.0  30.0 - 36.0 g/dL Final   RDW 08/02/2021 13.9  11.5 - 15.5 % Final   Platelets 08/02/2021 345  150 - 400 K/uL Final   nRBC 08/02/2021 0.0  0.0 - 0.2 % Final   Neutrophils Relative % 08/02/2021 64  % Final   Neutro Abs 08/02/2021 5.7  1.7 - 7.7 K/uL Final   Lymphocytes Relative 08/02/2021 31  % Final   Lymphs Abs 08/02/2021 2.8  0.7 - 4.0 K/uL Final   Monocytes Relative 08/02/2021 5  % Final   Monocytes Absolute 08/02/2021 0.5  0.1 - 1.0 K/uL Final   Eosinophils Relative 08/02/2021 0  % Final   Eosinophils Absolute 08/02/2021 0.0  0.0 - 0.5 K/uL Final   Basophils Relative 08/02/2021 0  % Final   Basophils Absolute 08/02/2021 0.0  0.0 - 0.1 K/uL Final   Immature Granulocytes 08/02/2021 0  % Final   Abs Immature Granulocytes 08/02/2021 0.03  0.00 - 0.07 K/uL Final   Performed at [facility], [place] [address] [place], [place] [address]   Sodium 08/02/2021 138  135 - 145 mmol/L Final   Potassium 08/02/2021 3.8  3.5 - 5.1 mmol/L Final   Chloride 08/02/2021 104  98 - 111 mmol/L Final   CO2 08/02/2021 23  22 - 32 mmol/L Final   Glucose, Bld 08/02/2021 90  70 - 99 mg/dL Final   Glucose reference range applies only to samples taken after fasting for at least 8 hours.   BUN 08/02/2021 15  6 - 20 mg/dL Final   Creatinine, Ser 08/02/2021 0.75  0.44 - 1.00 mg/dL Final   Calcium 08/02/2021 10.0  8.9 - 10.3 mg/dL Final   Total Protein 08/02/2021 7.1  6.5 - 8.1 g/dL Final   Albumin  08/02/2021 4.3  3.5 - 5.0 g/dL Final   AST 08/02/2021 21  15 - 41 U/L Final   ALT 08/02/2021 20  0 - 44 U/L Final   Alkaline Phosphatase 08/02/2021 74  38 - 126 U/L Final   Total Bilirubin 08/02/2021 0.5  0.3 - 1.2 mg/dL Final   GFR, Estimated 08/02/2021 >60  >60 mL/min Final   Comment: (NOTE) Calculated using the CKD-EPI Creatinine Equation (2021)    Anion gap 08/02/2021 11  5 - 15 Final   Performed at [facility],  [address], [place], [place] [address]   Alcohol, Ethyl (B) 08/02/2021 <10  <10 mg/dL Final   Comment: (NOTE) Lowest detectable limit for serum alcohol is 10 mg/dL.  For medical purposes only. Performed at [facility], [place] [address] [place], [place] [address]    POC Amphetamine UR 08/03/2021 Positive (A)  NONE DETECTED (Cut Off Level 1000 ng/mL) Final   POC Secobarbital (BAR) 08/03/2021 None Detected  NONE DETECTED (Cut Off Level 300 ng/mL) Final   POC Buprenorphine (BUP) 08/03/2021 None Detected  NONE DETECTED (Cut Off Level 10 ng/mL) Final   POC Oxazepam (BZO) 08/03/2021 Positive (A)  NONE DETECTED (Cut Off Level 300 ng/mL) Final   POC Cocaine UR 08/03/2021 None Detected  NONE DETECTED (Cut Off Level 300 ng/mL) Final   POC Methamphetamine UR 08/03/2021 None Detected  NONE DETECTED (Cut Off Level 1000 ng/mL) Final   POC Morphine 08/03/2021 None Detected  NONE DETECTED (Cut Off Level 300 ng/mL) Final   POC Methadone UR 08/03/2021 None Detected  NONE DETECTED (Cut Off Level 300 ng/mL) Final   POC Oxycodone UR 08/03/2021 None Detected  NONE DETECTED (Cut Off Level 100 ng/mL) Final   POC Marijuana UR 08/03/2021 None Detected  NONE DETECTED (Cut Off Level 50 ng/mL) Final   SARS Coronavirus 2 Ag 08/03/2021 Negative  Negative Preliminary   SARSCOV2ONAVIRUS 2 AG 08/03/2021 NEGATIVE  NEGATIVE Final   Comment: (NOTE) SARS-CoV-2 antigen NOT DETECTED.   Negative results are presumptive.  Negative results do not preclude SARS-CoV-2 infection and should not be  used as the sole basis for treatment or other patient management decisions, including infection  control decisions, particularly in the presence of clinical signs and  symptoms consistent with COVID-19, or in those who have been in contact with the virus.  Negative results must be combined with clinical observations, patient history, and epidemiological information. The expected result is Negative.  Fact Sheet for Patients: HandmadeRecipes.com.cy  Fact Sheet for Healthcare Providers: FuneralLife.at  This test is not yet approved or cleared by the [place] FDA and  has been authorized for detection and/or diagnosis of SARS-CoV-2 by FDA under an Emergency Use Authorization (EUA).  This EUA will remain in effect (meaning this test can be used) for the duration of  the COV                          ID-19 declaration under Section 564(b)(1) of the Act, 21 U.S.C. section 360bbb-3(b)(1), unless the authorization is terminated or revoked sooner.     Preg Test, Ur 08/03/2021 NEGATIVE  NEGATIVE Final   Comment:        THE SENSITIVITY OF THIS METHODOLOGY IS >24 mIU/mL   Admission on 08/01/2021, Discharged on 08/01/2021  Component Date Value Ref Range Status   Sodium 08/01/2021 137  135 - 145 mmol/L Final   Potassium 08/01/2021 3.7  3.5 - 5.1 mmol/L Final   Chloride 08/01/2021 107  98 - 111 mmol/L Final   CO2 08/01/2021 20 (L)  22 - 32 mmol/L Final   Glucose, Bld 08/01/2021 92  70 - 99 mg/dL Final   Glucose reference range applies only to samples taken after fasting for at least 8 hours.   BUN 08/01/2021 13  6 - 20 mg/dL Final   Creatinine, Ser 08/01/2021 0.63  0.44 - 1.00 mg/dL Final   Calcium 08/01/2021 9.3  8.9 - 10.3 mg/dL Final   GFR, Estimated 08/01/2021 >60  >60 mL/min  Final   Comment: (NOTE) Calculated using the CKD-EPI Creatinine Equation (2021)    Anion gap 08/01/2021 10  5 - 15 Final   Performed at [facility],  [place] [address], [place], [place] [address]   WBC 08/01/2021 8.0  4.0 - 10.5 K/uL Final   RBC 08/01/2021 4.84  3.87 - 5.11 MIL/uL Final   Hemoglobin 08/01/2021 12.4  12.0 - 15.0 g/dL Final   HCT 08/01/2021 40.1  36.0 - 46.0 % Final   MCV 08/01/2021 82.9  80.0 - 100.0 fL Final   MCH 08/01/2021 25.6 (L)  26.0 - 34.0 pg Final   MCHC 08/01/2021 30.9  30.0 - 36.0 g/dL Final   RDW 08/01/2021 14.0  11.5 - 15.5 % Final   Platelets 08/01/2021 305  150 - 400 K/uL Final   nRBC 08/01/2021 0.0  0.0 - 0.2 % Final   Performed at [facility] [address] [place], [place] [address]   I-stat hCG, quantitative 08/01/2021 <5.0  <5 mIU/mL Final   Comment 3 08/01/2021          Final   Comment:   GEST. AGE      CONC.  (mIU/mL)   <=1 WEEK        5 - 50     2 WEEKS       50 - 500     3 WEEKS       100 - 10,000     4 WEEKS     1,000 - 30,000        FEMALE AND NON-PREGNANT FEMALE:     LESS THAN 5 mIU/mL    Troponin I (High Sensitivity) 08/01/2021 2  <18 ng/L Final   Comment: (NOTE) Elevated high sensitivity troponin I (hsTnI) values and significant  changes across serial measurements may suggest ACS but many other  chronic and acute conditions are known to elevate hsTnI results.  Refer to the "Links" section for chest pain algorithms and additional  guidance. Performed at [facility], [place] [address] [place], [place] [address]    Color, Urine 08/01/2021 YELLOW  YELLOW Final   APPearance 08/01/2021 HAZY (A)  CLEAR Final   Specific Gravity, Urine 08/01/2021 1.018  1.005 - 1.030 Final   pH 08/01/2021 5.0  5.0 - 8.0 Final   Glucose, UA 08/01/2021 NEGATIVE  NEGATIVE mg/dL Final   Hgb urine dipstick 08/01/2021 NEGATIVE  NEGATIVE Final   Bilirubin Urine 08/01/2021 NEGATIVE  NEGATIVE Final   Ketones, ur 08/01/2021 NEGATIVE  NEGATIVE mg/dL Final   Protein, ur 08/01/2021 NEGATIVE  NEGATIVE mg/dL Final   Nitrite 08/01/2021 NEGATIVE  NEGATIVE Final   Leukocytes,Ua 08/01/2021 NEGATIVE  NEGATIVE Final    Performed at [facility], [place] [address] [place], [place] [address]  Admission on 07/30/2021, Discharged on 07/31/2021  Component Date Value Ref Range Status   Preg Test, Ur 07/30/2021 NEGATIVE  NEGATIVE Final   Comment:        THE SENSITIVITY OF THIS METHODOLOGY IS >24 mIU/mL   Admission on 07/29/2021, Discharged on 07/30/2021  Component Date Value Ref Range Status   Glucose-Capillary 07/29/2021 90  70 - 99 mg/dL Final   Glucose reference range applies only to samples taken after fasting for at least 8 hours.   Sodium 07/29/2021 140  135 - 145 mmol/L Final   Potassium 07/29/2021 3.9  3.5 - 5.1 mmol/L Final   Chloride 07/29/2021 112 (H)  98 - 111 mmol/L Final  CO2 07/29/2021 18 (L)  22 - 32 mmol/L Final   Glucose, Bld 07/29/2021 94  70 - 99 mg/dL Final   Glucose reference range applies only to samples taken after fasting for at least 8 hours.   BUN 07/29/2021 10  6 - 20 mg/dL Final   Creatinine, Ser 07/29/2021 0.68  0.44 - 1.00 mg/dL Final   Calcium 07/29/2021 8.4 (L)  8.9 - 10.3 mg/dL Final   Total Protein 07/29/2021 5.8 (L)  6.5 - 8.1 g/dL Final   Albumin 07/29/2021 3.3 (L)  3.5 - 5.0 g/dL Final   AST 07/29/2021 17  15 - 41 U/L Final   ALT 07/29/2021 18  0 - 44 U/L Final   Alkaline Phosphatase 07/29/2021 57  38 - 126 U/L Final   Total Bilirubin 07/29/2021 0.4  0.3 - 1.2 mg/dL Final   GFR, Estimated 07/29/2021 >60  >60 mL/min Final   Comment: (NOTE) Calculated using the CKD-EPI Creatinine Equation (2021)    Anion gap 07/29/2021 10  5 - 15 Final   Performed at [facility], [place] [address] [place], [place] [address]   Salicylate Lvl 23/55/7322 <7.0 (L)  7.0 - 30.0 mg/dL Final   Performed at [place] [address], [place], [place] [address]   Acetaminophen (Tylenol), Serum 07/29/2021 <10 (L)  10 - 30 ug/mL Final   Comment: (NOTE) Therapeutic concentrations vary significantly. A range of 10-30 ug/mL  may be an effective concentration for many patients.  However, some  are best treated at concentrations outside of this range. Acetaminophen concentrations >150 ug/mL at 4 hours after ingestion  and >50 ug/mL at 12 hours after ingestion are often associated with  toxic reactions.  Performed at [facility], [place] [address] [place], [place] [address]    Alcohol, Ethyl (B) 07/29/2021 <10  <10 mg/dL Final   Comment: (NOTE) Lowest detectable limit for serum alcohol is 10 mg/dL.  For medical purposes only. Performed at [facility], [place] [address] [place], [place] [address]    Opiates 07/29/2021 NONE DETECTED  NONE DETECTED Final   Cocaine 07/29/2021 NONE DETECTED  NONE DETECTED Final   Benzodiazepines 07/29/2021 NONE DETECTED  NONE DETECTED Final   Amphetamines 07/29/2021 NONE DETECTED  NONE DETECTED Final   Tetrahydrocannabinol 07/29/2021 NONE DETECTED  NONE DETECTED Final   Barbiturates 07/29/2021 NONE DETECTED  NONE DETECTED Final   Comment: (NOTE) DRUG SCREEN FOR MEDICAL PURPOSES ONLY.  IF CONFIRMATION IS NEEDED FOR ANY PURPOSE, NOTIFY LAB WITHIN 5 DAYS.  LOWEST DETECTABLE LIMITS FOR URINE DRUG SCREEN Drug Class                     Cutoff (ng/mL) Amphetamine and metabolites    1000 Barbiturate and metabolites    200 Benzodiazepine                 151 Tricyclics and metabolites     300 Opiates and metabolites        300 Cocaine and metabolites        300 THC                            50 Performed at [facility], [place] [address] [place], [place] [address]    WBC 07/29/2021 6.7  4.0 - 10.5 K/uL Final   RBC 07/29/2021 4.13  3.87 - 5.11 MIL/uL Final   Hemoglobin 07/29/2021 10.8 (  L)  12.0 - 15.0 g/dL Final   HCT 07/29/2021 34.7 (L)  36.0 - 46.0 % Final   MCV 07/29/2021 84.0  80.0 - 100.0 fL Final   MCH 07/29/2021 26.2  26.0 - 34.0 pg Final   MCHC 07/29/2021 31.1  30.0 - 36.0 g/dL Final   RDW 07/29/2021 14.3  11.5 - 15.5 % Final   Platelets 07/29/2021 236  150 - 400 K/uL Final   nRBC 07/29/2021 0.0  0.0 -  0.2 % Final   Neutrophils Relative % 07/29/2021 61  % Final   Neutro Abs 07/29/2021 4.1  1.7 - 7.7 K/uL Final   Lymphocytes Relative 07/29/2021 33  % Final   Lymphs Abs 07/29/2021 2.2  0.7 - 4.0 K/uL Final   Monocytes Relative 07/29/2021 5  % Final   Monocytes Absolute 07/29/2021 0.3  0.1 - 1.0 K/uL Final   Eosinophils Relative 07/29/2021 1  % Final   Eosinophils Absolute 07/29/2021 0.1  0.0 - 0.5 K/uL Final   Basophils Relative 07/29/2021 0  % Final   Basophils Absolute 07/29/2021 0.0  0.0 - 0.1 K/uL Final   Immature Granulocytes 07/29/2021 0  % Final   Abs Immature Granulocytes 07/29/2021 0.02  0.00 - 0.07 K/uL Final   Performed at [facility], [place] [address] [place], [place] [address]   Color, Urine 07/29/2021 YELLOW  YELLOW Final   APPearance 07/29/2021 CLOUDY (A)  CLEAR Final   Specific Gravity, Urine 07/29/2021 1.005  1.005 - 1.030 Final   pH 07/29/2021 6.0  5.0 - 8.0 Final   Glucose, UA 07/29/2021 NEGATIVE  NEGATIVE mg/dL Final   Hgb urine dipstick 07/29/2021 NEGATIVE  NEGATIVE Final   Bilirubin Urine 07/29/2021 NEGATIVE  NEGATIVE Final   Ketones, ur 07/29/2021 NEGATIVE  NEGATIVE mg/dL Final   Protein, ur 07/29/2021 NEGATIVE  NEGATIVE mg/dL Final   Nitrite 07/29/2021 NEGATIVE  NEGATIVE Final   Leukocytes,Ua 07/29/2021 MODERATE (A)  NEGATIVE Final   RBC / HPF 07/29/2021 6-10  0 - 5 RBC/hpf Final   WBC, UA 07/29/2021 6-10  0 - 5 WBC/hpf Final   Bacteria, UA 07/29/2021 MANY (A)  NONE SEEN Final   Squamous Epithelial / LPF 07/29/2021 6-10  0 - 5 Final   Mucus 07/29/2021 PRESENT   Final   Amorphous Crystal 07/29/2021 PRESENT   Final   Performed at [facility], [place] [address], [place], [place] [address]   SARS Coronavirus 2 by RT PCR 07/30/2021 NEGATIVE  NEGATIVE Final   Comment: (NOTE) SARS-CoV-2 target nucleic acids are NOT DETECTED.  The SARS-CoV-2 RNA is generally detectable in upper respiratory specimens during the acute phase of infection. The  lowest concentration of SARS-CoV-2 viral copies this assay can detect is 138 copies/mL. A negative result does not preclude SARS-Cov-2 infection and should not be used as the sole basis for treatment or other patient management decisions. A negative result may occur with  improper specimen collection/handling, submission of specimen other than nasopharyngeal swab, presence of viral mutation(s) within the areas targeted by this assay, and inadequate number of viral copies(<138 copies/mL). A negative result must be combined with clinical observations, patient history, and epidemiological information. The expected result is Negative.  Fact Sheet for Patients:  EntrepreneurPulse.com.au  Fact Sheet for Healthcare Providers:  IncredibleEmployment.be  This test is no                          t yet approved or cleared  by the [place] and  has been authorized for detection and/or diagnosis of SARS-CoV-2 by FDA under an Emergency Use Authorization (EUA). This EUA will remain  in effect (meaning this test can be used) for the duration of the COVID-19 declaration under Section 564(b)(1) of the Act, 21 U.S.C.section 360bbb-3(b)(1), unless the authorization is terminated  or revoked sooner.       Influenza A by PCR 07/30/2021 NEGATIVE  NEGATIVE Final   Influenza B by PCR 07/30/2021 NEGATIVE  NEGATIVE Final   Comment: (NOTE) The Xpert Xpress SARS-CoV-2/FLU/RSV plus assay is intended as an aid in the diagnosis of influenza from Nasopharyngeal swab specimens and should not be used as a sole basis for treatment. Nasal washings and aspirates are unacceptable for Xpert Xpress SARS-CoV-2/FLU/RSV testing.  Fact Sheet for Patients: EntrepreneurPulse.com.au  Fact Sheet for Healthcare Providers: IncredibleEmployment.be  This test is not yet approved or cleared by the [place] FDA and has been authorized for  detection and/or diagnosis of SARS-CoV-2 by FDA under an Emergency Use Authorization (EUA). This EUA will remain in effect (meaning this test can be used) for the duration of the COVID-19 declaration under Section 564(b)(1) of the Act, 21 U.S.C. section 360bbb-3(b)(1), unless the authorization is terminated or revoked.  Performed at [facility], [place] [address] [place], [place] [address]   Admission on 07/20/2021, Discharged on 07/25/2021  Component Date Value Ref Range Status   Glucose-Capillary 07/20/2021 91  70 - 99 mg/dL Final   Glucose reference range applies only to samples taken after fasting for at least 8 hours.   Sodium 07/20/2021 140  135 - 145 mmol/L Final   Potassium 07/20/2021 3.9  3.5 - 5.1 mmol/L Final   Chloride 07/20/2021 111  98 - 111 mmol/L Final   CO2 07/20/2021 21 (L)  22 - 32 mmol/L Final   Glucose, Bld 07/20/2021 94  70 - 99 mg/dL Final   Glucose reference range applies only to samples taken after fasting for at least 8 hours.   BUN 07/20/2021 16  6 - 20 mg/dL Final   Creatinine, Ser 07/20/2021 0.61  0.44 - 1.00 mg/dL Final   Calcium 07/20/2021 8.9  8.9 - 10.3 mg/dL Final   Total Protein 07/20/2021 6.3 (L)  6.5 - 8.1 g/dL Final   Albumin 07/20/2021 3.6  3.5 - 5.0 g/dL Final   AST 07/20/2021 17  15 - 41 U/L Final   ALT 07/20/2021 15  0 - 44 U/L Final   Alkaline Phosphatase 07/20/2021 53  38 - 126 U/L Final   Total Bilirubin 07/20/2021 0.5  0.3 - 1.2 mg/dL Final   GFR, Estimated 07/20/2021 >60  >60 mL/min Final   Comment: (NOTE) Calculated using the CKD-EPI Creatinine Equation (2021)    Anion gap 07/20/2021 8  5 - 15 Final   Performed at [facility], [place] [address] [place], [place] [address]   Salicylate Lvl 51/03/5850 <7.0 (L)  7.0 - 30.0 mg/dL Final   Performed at [place] [address], [place], [place] [address]   Acetaminophen (Tylenol), Serum 07/20/2021 <10 (L)  10 - 30 ug/mL Final   Comment: (NOTE) Therapeutic  concentrations vary significantly. A range of 10-30 ug/mL  may be an effective concentration for many patients. However, some  are best treated at concentrations outside of this range. Acetaminophen concentrations >150 ug/mL at 4 hours after ingestion  and >50 ug/mL at 12 hours after ingestion are often associated with  toxic  reactions.  Performed at [facility], [place] [address], [place], [place] [address]    Alcohol, Ethyl (B) 07/20/2021 <10  <10 mg/dL Final   Comment: (NOTE) Lowest detectable limit for serum alcohol is 10 mg/dL.  For medical purposes only. Performed at [facility], [place] [address], [place], [place] [address]    Opiates 07/21/2021 NONE DETECTED  NONE DETECTED Final   Cocaine 07/21/2021 NONE DETECTED  NONE DETECTED Final   Benzodiazepines 07/21/2021 NONE DETECTED  NONE DETECTED Final   Amphetamines 07/21/2021 NONE DETECTED  NONE DETECTED Final   Tetrahydrocannabinol 07/21/2021 NONE DETECTED  NONE DETECTED Final   Barbiturates 07/21/2021 NONE DETECTED  NONE DETECTED Final   Comment: (NOTE) DRUG SCREEN FOR MEDICAL PURPOSES ONLY.  IF CONFIRMATION IS NEEDED FOR ANY PURPOSE, NOTIFY LAB WITHIN 5 DAYS.  LOWEST DETECTABLE LIMITS FOR URINE DRUG SCREEN Drug Class                     Cutoff (ng/mL) Amphetamine and metabolites    1000 Barbiturate and metabolites    200 Benzodiazepine                 299 Tricyclics and metabolites     300 Opiates and metabolites        300 Cocaine and metabolites        300 THC                            50 Performed at [facility], [place] [address] [place], [place] [address]    WBC 07/20/2021 8.3  4.0 - 10.5 K/uL Final   RBC 07/20/2021 4.09  3.87 - 5.11 MIL/uL Final   Hemoglobin 07/20/2021 10.6 (L)  12.0 - 15.0 g/dL Final   HCT 07/20/2021 33.9 (L)  36.0 - 46.0 % Final   MCV 07/20/2021 82.9  80.0 - 100.0 fL Final   MCH 07/20/2021 25.9 (L)  26.0 - 34.0 pg Final   MCHC 07/20/2021 31.3  30.0 - 36.0 g/dL Final   RDW  07/20/2021 14.4  11.5 - 15.5 % Final   Platelets 07/20/2021 272  150 - 400 K/uL Final   nRBC 07/20/2021 0.0  0.0 - 0.2 % Final   Neutrophils Relative % 07/20/2021 61  % Final   Neutro Abs 07/20/2021 5.0  1.7 - 7.7 K/uL Final   Lymphocytes Relative 07/20/2021 32  % Final   Lymphs Abs 07/20/2021 2.7  0.7 - 4.0 K/uL Final   Monocytes Relative 07/20/2021 6  % Final   Monocytes Absolute 07/20/2021 0.5  0.1 - 1.0 K/uL Final   Eosinophils Relative 07/20/2021 1  % Final   Eosinophils Absolute 07/20/2021 0.1  0.0 - 0.5 K/uL Final   Basophils Relative 07/20/2021 0  % Final   Basophils Absolute 07/20/2021 0.0  0.0 - 0.1 K/uL Final   Immature Granulocytes 07/20/2021 0  % Final   Abs Immature Granulocytes 07/20/2021 0.03  0.00 - 0.07 K/uL Final   Performed at [facility], [place] [address], [place], [place] [address]   I-stat hCG, quantitative 07/20/2021 <5.0  <5 mIU/mL Final   Comment 3 07/20/2021          Final   Comment:   GEST. AGE      CONC.  (mIU/mL)   <=1 WEEK        5 - 50     2 WEEKS  50 - 500     3 WEEKS       100 - 10,000     4 WEEKS     1,000 - 30,000        FEMALE AND NON-PREGNANT FEMALE:     LESS THAN 5 mIU/mL    Sodium 07/21/2021 140  135 - 145 mmol/L Final   Potassium 07/21/2021 3.8  3.5 - 5.1 mmol/L Final   Chloride 07/21/2021 112 (H)  98 - 111 mmol/L Final   CO2 07/21/2021 22  22 - 32 mmol/L Final   Glucose, Bld 07/21/2021 106 (H)  70 - 99 mg/dL Final   Glucose reference range applies only to samples taken after fasting for at least 8 hours.   BUN 07/21/2021 13  6 - 20 mg/dL Final   Creatinine, Ser 07/21/2021 0.61  0.44 - 1.00 mg/dL Final   Calcium 07/21/2021 8.5 (L)  8.9 - 10.3 mg/dL Final   Total Protein 07/21/2021 5.6 (L)  6.5 - 8.1 g/dL Final   Albumin 07/21/2021 3.2 (L)  3.5 - 5.0 g/dL Final   AST 07/21/2021 16  15 - 41 U/L Final   ALT 07/21/2021 12  0 - 44 U/L Final   Alkaline Phosphatase 07/21/2021 50  38 - 126 U/L Final   Total Bilirubin 07/21/2021 0.7   0.3 - 1.2 mg/dL Final   GFR, Estimated 07/21/2021 >60  >60 mL/min Final   Comment: (NOTE) Calculated using the CKD-EPI Creatinine Equation (2021)    Anion gap 07/21/2021 6  5 - 15 Final   Performed at [facility], [place] [address], [place], [place] [address]   WBC 07/21/2021 6.9  4.0 - 10.5 K/uL Final   RBC 07/21/2021 3.77 (L)  3.87 - 5.11 MIL/uL Final   Hemoglobin 07/21/2021 9.8 (L)  12.0 - 15.0 g/dL Final   HCT 07/21/2021 31.7 (L)  36.0 - 46.0 % Final   MCV 07/21/2021 84.1  80.0 - 100.0 fL Final   MCH 07/21/2021 26.0  26.0 - 34.0 pg Final   MCHC 07/21/2021 30.9  30.0 - 36.0 g/dL Final   RDW 07/21/2021 14.6  11.5 - 15.5 % Final   Platelets 07/21/2021 242  150 - 400 K/uL Final   nRBC 07/21/2021 0.0  0.0 - 0.2 % Final   Performed at [place] [address], [place], [place] [address]   SARS Coronavirus 2 07/24/2021 NEGATIVE  NEGATIVE Final   Comment: (NOTE) SARS-CoV-2 target nucleic acids are NOT DETECTED.  The SARS-CoV-2 RNA is generally detectable in upper and lower respiratory specimens during the acute phase of infection. Negative results do not preclude SARS-CoV-2 infection, do not rule out co-infections with other pathogens, and should not be used as the sole basis for treatment or other patient management decisions. Negative results must be combined with clinical observations, patient history, and epidemiological information. The expected result is Negative.  Fact Sheet for Patients: SugarRoll.be  Fact Sheet for Healthcare Providers: https://www.woods-mathews.com/  This test is not yet approved or cleared by the [place] FDA and  has been authorized for detection and/or diagnosis of SARS-CoV-2 by FDA under an Emergency Use Authorization (EUA). This EUA will remain  in effect (meaning this test can be used) for the duration of the COVID-19 declaration under Se                          ction 564(b)(1) of the  Act, 21 U.S.C. section  360bbb-3(b)(1), unless the authorization is terminated or revoked sooner.  Performed at [facility], [place] [address] [place], [place] [address]   Admission on 07/19/2021, Discharged on 07/20/2021  Component Date Value Ref Range Status   SARS Coronavirus 2 by RT PCR 07/19/2021 NEGATIVE  NEGATIVE Final   Comment: (NOTE) SARS-CoV-2 target nucleic acids are NOT DETECTED.  The SARS-CoV-2 RNA is generally detectable in upper respiratory specimens during the acute phase of infection. The lowest concentration of SARS-CoV-2 viral copies this assay can detect is 138 copies/mL. A negative result does not preclude SARS-Cov-2 infection and should not be used as the sole basis for treatment or other patient management decisions. A negative result may occur with  improper specimen collection/handling, submission of specimen other than nasopharyngeal swab, presence of viral mutation(s) within the areas targeted by this assay, and inadequate number of viral copies(<138 copies/mL). A negative result must be combined with clinical observations, patient history, and epidemiological information. The expected result is Negative.  Fact Sheet for Patients:  EntrepreneurPulse.com.au  Fact Sheet for Healthcare Providers:  IncredibleEmployment.be  This test is no                          t yet approved or cleared by the [place] FDA and  has been authorized for detection and/or diagnosis of SARS-CoV-2 by FDA under an Emergency Use Authorization (EUA). This EUA will remain  in effect (meaning this test can be used) for the duration of the COVID-19 declaration under Section 564(b)(1) of the Act, 21 U.S.C.section 360bbb-3(b)(1), unless the authorization is terminated  or revoked sooner.       Influenza A by PCR 07/19/2021 NEGATIVE  NEGATIVE Final   Influenza B by PCR 07/19/2021 NEGATIVE  NEGATIVE Final   Comment: (NOTE) The Xpert  Xpress SARS-CoV-2/FLU/RSV plus assay is intended as an aid in the diagnosis of influenza from Nasopharyngeal swab specimens and should not be used as a sole basis for treatment. Nasal washings and aspirates are unacceptable for Xpert Xpress SARS-CoV-2/FLU/RSV testing.  Fact Sheet for Patients: EntrepreneurPulse.com.au  Fact Sheet for Healthcare Providers: IncredibleEmployment.be  This test is not yet approved or cleared by the [place] FDA and has been authorized for detection and/or diagnosis of SARS-CoV-2 by FDA under an Emergency Use Authorization (EUA). This EUA will remain in effect (meaning this test can be used) for the duration of the COVID-19 declaration under Section 564(b)(1) of the Act, 21 U.S.C. section 360bbb-3(b)(1), unless the authorization is terminated or revoked.  Performed at [facility], [place] [address], [place], [place] [address]    POC Amphetamine UR 07/20/2021 None Detected  NONE DETECTED (Cut Off Level 1000 ng/mL) Final   POC Secobarbital (BAR) 07/20/2021 None Detected  NONE DETECTED (Cut Off Level 300 ng/mL) Final   POC Buprenorphine (BUP) 07/20/2021 None Detected  NONE DETECTED (Cut Off Level 10 ng/mL) Final   POC Oxazepam (BZO) 07/20/2021 Positive (A)  NONE DETECTED (Cut Off Level 300 ng/mL) Final   POC Cocaine UR 07/20/2021 None Detected  NONE DETECTED (Cut Off Level 300 ng/mL) Final   POC Methamphetamine UR 07/20/2021 None Detected  NONE DETECTED (Cut Off Level 1000 ng/mL) Final   POC Morphine 07/20/2021 None Detected  NONE DETECTED (Cut Off Level 300 ng/mL) Final   POC Methadone UR 07/20/2021 None Detected  NONE DETECTED (Cut Off Level 300 ng/mL) Final   POC Oxycodone UR 07/20/2021 None Detected  NONE DETECTED (Cut Off  Level 100 ng/mL) Final   POC Marijuana UR 07/20/2021 None Detected  NONE DETECTED (Cut Off Level 50 ng/mL) Final   SARSCOV2ONAVIRUS 2 AG 07/20/2021 NEGATIVE  NEGATIVE Final   Comment:  (NOTE) SARS-CoV-2 antigen NOT DETECTED.   Negative results are presumptive.  Negative results do not preclude SARS-CoV-2 infection and should not be used as the sole basis for treatment or other patient management decisions, including infection  control decisions, particularly in the presence of clinical signs and  symptoms consistent with COVID-19, or in those who have been in contact with the virus.  Negative results must be combined with clinical observations, patient history, and epidemiological information. The expected result is Negative.  Fact Sheet for Patients: HandmadeRecipes.com.cy  Fact Sheet for Healthcare Providers: FuneralLife.at  This test is not yet approved or cleared by the [place] FDA and  has been authorized for detection and/or diagnosis of SARS-CoV-2 by FDA under an Emergency Use Authorization (EUA).  This EUA will remain in effect (meaning this test can be used) for the duration of  the COV                          ID-19 declaration under Section 564(b)(1) of the Act, 21 U.S.C. section 360bbb-3(b)(1), unless the authorization is terminated or revoked sooner.     Preg Test, Ur 07/20/2021 NEGATIVE  NEGATIVE Final   Comment:        THE SENSITIVITY OF THIS METHODOLOGY IS >24 mIU/mL   There may be more visits with results that are not included.    Allergies: Bee venom, Coconut flavor, Fish allergy, Geodon [ziprasidone hcl], Haloperidol and related, Lithobid [lithium], Roxicodone [oxycodone], Seroquel [quetiapine], Shellfish allergy, Phenergan [promethazine hcl], Prilosec [omeprazole], Sulfa antibiotics, Tegretol [carbamazepine], Prozac [fluoxetine], Tape, and Tylenol [acetaminophen]  PTA Medications: (Not in a hospital admission)   Medical Decision Making  Lab Orders         Resp Panel by RT-PCR (Flu A&B, Covid) Anterior Nasal Swab         Pregnancy, urine         POCT Urine Drug Screen - (I-Screen)          POC SARS Coronavirus 2 Ag     Recent labwork completed at ED  Meds ordered this encounter  Medications   alum & mag hydroxide-simeth (MAALOX/MYLANTA) 200-200-20 MG/5ML suspension 30 mL   magnesium hydroxide (MILK OF MAGNESIA) suspension 30 mL   albuterol (VENTOLIN HFA) 108 (90 Base) MCG/ACT inhaler 2 puff   fluticasone furoate-vilanterol (BREO ELLIPTA) 200-25 MCG/ACT 1 puff   calcium carbonate (TUMS - dosed in mg elemental calcium) chewable tablet 1,500 mg   cariprazine (VRAYLAR) capsule 3 mg   cyclobenzaprine (FLEXERIL) tablet 10 mg   escitalopram (LEXAPRO) tablet 15 mg   ferrous sulfate tablet 325 mg   gabapentin (NEURONTIN) capsule 300 mg   hydrOXYzine (ATARAX) tablet 25 mg   ibuprofen (ADVIL) tablet 600 mg   prazosin (MINIPRESS) capsule 1 mg   propranolol (INDERAL) tablet 10 mg   QUEtiapine (SEROQUEL) tablet 25 mg   QUEtiapine (SEROQUEL) tablet 12.5 mg     Pt admitted to continuous observation recommended for inpatient admission. She has been accepted to [facility] for inpatient psychiatric admission.  Recommendations  Based on my evaluation the patient does not appear to have an emergency medical condition.  Tharon Aquas, NP 10/18/21  3:10 PM

## 2021-10-18 NOTE — ED Notes (Signed)
Patient resting quietly in bed.  Respirations equal and unlabored, skin warm and dry, NAD. Routine safety checks conducted according to facility protocol. Will continue to monitor for safety.  

## 2021-10-18 NOTE — BH Assessment (Addendum)
Comprehensive Clinical Assessment (CCA) Screening, Triage and Referral Note  10/18/2021 [name] [id number]  Patient is urgent. Triage and screening completed.   Chief Complaint: Suicidal ideations   Visit Diagnosis: Asperger's Syndrome, Post traumatic Stress Disorder, Anxiety, Schizoaffective bipolar type, and Borderline Personality Disorder.  Patient Reported Information How did you hear about [name]? Legal System  What Is the Reason for Your Visit/Call Today? [name] is a 31 year old female patient who has a hx of Asperger's Syndrome, Post traumatic Stress Disorder, Anxiety, Schizoaffective bipolar type, and Borderline Personality Disorder. She presents to the [facility], voluntarily, transported by [employer]. Patient has a complaint today of suicidal ideations and plan to overdose. She reports suicidal ideations daily for the past 2-3 weeks triggered by financial issues and loosing custody of her daughter. Hx of self injurious behaviors, suicide attempts (60+), inpatient hospitalizations (100+), and severe depressive/anxiety symptoms. Denies HI and drug use. However, does report auditory/visual hallucinations. Receives outpatient services at [facility] in [place]. Lives with boyfriend.  How Long Has This Been Causing You Problems? > than 6 months  What Do You Feel Would Help You the Most Today? Medication(s); Treatment for Depression or other mood problem   Have You Recently Had Any Thoughts About Hurting Yourself? Yes  Are You Planning to Commit Suicide/Harm Yourself At This time? Yes   Have you Recently Had Thoughts About Hurting Someone [name]? No  Are You Planning to Harm Someone at This Time? No  Explanation: No data recorded  Have You Used Any Alcohol or Drugs in the Past 24 Hours? No Do You Currently Have a Therapist/Psychiatrist? Yes  Name of Therapist/Psychiatrist: [facility] and medication through [facility] Recently Discharged  From Any Office Practice or Programs? Yes  Explanation of Discharge From Practice/Program: Discharged from [facility] 07/29/2021       Does Patient Present under Involuntary Commitment? No  IVC Papers Initial File Date:   [place] of Residence: [name]   Patient Currently Receiving the Following Services: CST Marine scientist); Medication Management; Individual Therapy   Determination of Need: Urgent (48 hours)   Options For Referral: Medication Management; Outpatient Therapy; Inpatient Hospitalization; Other: Comment (ACTT services)   Discharge Disposition:     [name], Counselor
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
## 2021-10-19 ENCOUNTER — Telehealth (HOSPITAL_COMMUNITY): Payer: Self-pay

## 2021-10-19 DIAGNOSIS — R45851 Suicidal ideations: Secondary | ICD-10-CM | POA: Diagnosis not present

## 2021-10-19 MED ORDER — PREGABALIN 50 MG PO CAPS
50.0000 mg | ORAL_CAPSULE | Freq: Three times a day (TID) | ORAL | Status: DC
  Administered 2021-10-19 – 2021-10-23 (×12): 50 mg via ORAL
  Filled 2021-10-19 (×12): qty 1

## 2021-10-19 MED ORDER — BACITRACIN-NEOMYCIN-POLYMYXIN OINTMENT TUBE
TOPICAL_OINTMENT | Freq: Two times a day (BID) | CUTANEOUS | Status: DC
  Administered 2021-10-19 – 2021-10-23 (×6): 1 via TOPICAL
  Filled 2021-10-19 (×2): qty 14.17

## 2021-10-19 MED ORDER — DOCUSATE SODIUM 100 MG PO CAPS
100.0000 mg | ORAL_CAPSULE | Freq: Every day | ORAL | Status: DC
  Administered 2021-10-19 – 2021-10-20 (×2): 100 mg via ORAL
  Filled 2021-10-19 (×4): qty 1

## 2021-10-19 MED ORDER — DICLOFENAC SODIUM 1 % EX GEL
2.0000 g | Freq: Four times a day (QID) | CUTANEOUS | Status: DC
  Administered 2021-10-19 – 2021-10-23 (×14): 2 g via TOPICAL
  Filled 2021-10-19: qty 100

## 2021-10-19 MED ORDER — QUETIAPINE 12.5 MG HALF TABLET
12.5000 mg | ORAL_TABLET | Freq: Every day | ORAL | Status: DC
  Administered 2021-10-19: 12.5 mg via ORAL
  Filled 2021-10-19 (×2): qty 1

## 2021-10-19 NOTE — BH Assessment (Signed)
Care Management - Follow Up [facility] Discharges   Patient has been placed in an inpatient psychiatric hospital ([place]) on 10-18-2021.

## 2021-10-19 NOTE — Progress Notes (Signed)
Pt is alert, oriented and able to make needs known. Took her medications as ordered this morning. She denies any current SI, admits she was having passive SI when she came in. She says she is still having auditory and visual hallucinations. Pt is seen talking to herself often but is easily redirected. She participated in groups appropriately and she is engaging in conversations with peers appropriately. Will continue q55mn checks and monitor.

## 2021-10-19 NOTE — Progress Notes (Signed)
Date:  10/19/2021 Time:  9:00 AM   Group Topic/Focus:    Goals Group:   The focus of this group is to help patients establish daily goals to achieve during treatment and discuss how the patient can incorporate goal setting into their daily lives to aide in recovery.   Orientation:   The focus of this group is to educate the patient on the purpose and policies of crisis stabilization and provide a format to answer questions about their admission.  The group details unit policies and expectations of patients while admitted.    Participation Level:  Active   Participation Quality:  Appropriate   Affect:  Appropriate   Cognitive:  Appropriate   Insight: Appropriate   Engagement in Group:  Engaged   Modes of Intervention:  Discussion   Additional Comments:  N/A   Luanna Salk RN 10/19/2021 9:30AM

## 2021-10-19 NOTE — Plan of Care (Signed)
  Problem: Education: Goal: Knowledge of [place] Education information/materials will improve Outcome: Progressing Goal: Emotional status will improve Outcome: Progressing Goal: Mental status will improve Outcome: Progressing Goal: Verbalization of understanding the information provided will improve Outcome: Progressing   Problem: Coping: Goal: Ability to verbalize frustrations and anger appropriately will improve Outcome: Progressing   Problem: Health Behavior/Discharge Planning: Goal: Identification of resources available to assist in meeting health care needs will improve Outcome: Progressing Goal: Compliance with treatment plan for underlying cause of condition will improve Outcome: Progressing
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
## 2021-10-19 NOTE — Progress Notes (Signed)
     10/19/21 0609  Vital Signs  Temp (!) 97.5 F (36.4 C)  Temp Source Oral  Pulse Rate (!) 102  Pulse Rate Source Monitor  BP (!) 102/57  BP Location Left Arm  BP Method Automatic  Patient Position (if appropriate) Sitting  Oxygen Therapy  SpO2 100 %      10/19/21 0611  Vital Signs  Pulse Rate 98  Pulse Rate Source Monitor  BP (!) 106/52  BP Location Left Arm  BP Method Automatic  Patient Position (if appropriate) Standing  Oxygen Therapy  SpO2 99 %    Pt is asymptomatic.  Hydrated patient and provided a pitcher of water.  Encouraged pt to hydrate often.
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
## 2021-10-19 NOTE — Progress Notes (Signed)
Adult Psychoeducational Group Note  Date:  10/19/2021 Time:  11:00 PM  Group Topic/Focus:  Wrap-Up Group:   The focus of this group is to help patients review their daily goal of treatment and discuss progress on daily workbooks.  Participation Level:  Active  Participation Quality:  Appropriate  Affect:  Appropriate  Cognitive:  Oriented  Insight: Appropriate  Engagement in Group:  Engaged  Modes of Intervention:  Education and Exploration  Additional Comments:  Patient attended and participated in group tonight. She reports that she learnt that if you need help to get it.  Salley [facility] 10/19/2021, 11:00 PM

## 2021-10-20 ENCOUNTER — Encounter (HOSPITAL_COMMUNITY): Payer: Self-pay

## 2021-10-20 DIAGNOSIS — R45851 Suicidal ideations: Secondary | ICD-10-CM | POA: Diagnosis not present

## 2021-10-20 MED ORDER — QUETIAPINE FUMARATE 25 MG PO TABS
12.5000 mg | ORAL_TABLET | Freq: Two times a day (BID) | ORAL | Status: DC | PRN
  Administered 2021-10-20 – 2021-10-22 (×4): 12.5 mg via ORAL
  Filled 2021-10-20 (×2): qty 1

## 2021-10-20 MED ORDER — DOCUSATE SODIUM 100 MG PO CAPS
100.0000 mg | ORAL_CAPSULE | ORAL | Status: DC
  Administered 2021-10-22: 100 mg via ORAL
  Filled 2021-10-20 (×2): qty 1

## 2021-10-20 NOTE — Group Note (Signed)
Recreation Therapy Group Note   Group Topic:Leisure Education  Group Date: 10/20/2021 Start Time: 0930 End Time: 1000 Facilitators: Victorino Sparrow, LRT,CTRS Location: [address] Dayroom   Goal Area(s) Addresses:  Patient will successfully identify positive leisure and recreation activities.  Patient will acknowledge benefits of participation in healthy leisure activities post discharge.  Patient will actively work with peers toward a shared goal.   Group Description: Pictionary. In groups of 5-7, patients took turns trying to guess the picture being drawn on the board by their teammate.  If the team guessed the correct answer, they won a point.  If the team guessed wrong, the other team got a chance to steal the point. After several rounds of game play, the team with the most points were declared winners. Post-activity discussion reviewed benefits of positive recreation outlets: reducing stress, improving coping mechanisms, increasing self-esteem, and building larger support systems.    Affect/Mood: N/A   Participation Level: Did not attend    Clinical Observations/Individualized Feedback:     Plan: Continue to engage patient in RT group sessions 2-3x/week.   Victorino Sparrow, LRT,CTRS 10/20/2021 11:55 AM

## 2021-10-20 NOTE — Plan of Care (Signed)
Pt alert and oriented x4. Pt states she did not go to breakfast this morning because she wanted to sleep. She states the voices are becoming more distant but still there at times. She denies any current SI/HI. She verbally contracts for safety. She denies any physical pain today and states the medication change to Lyrica has definitely helped her. Will  continue q36mn checks and monitor.   Problem: Education: Goal: Knowledge of Standard General Education information/materials will improve Outcome: Progressing Goal: Emotional status will improve Outcome: Progressing Goal: Mental status will improve Outcome: Progressing Goal: Verbalization of understanding the information provided will improve Outcome: Progressing   Problem: Coping: Goal: Ability to verbalize frustrations and anger appropriately will improve Outcome: Progressing Goal: Ability to demonstrate self-control will improve Outcome: Progressing   Problem: Health Behavior/Discharge Planning: Goal: Identification of resources available to assist in meeting health care needs will improve Outcome: Progressing Goal: Compliance with treatment plan for underlying cause of condition will improve Outcome: Progressing

## 2021-10-20 NOTE — Progress Notes (Signed)
Patient called the nurses station and stated she was having chest pain. Patient reports she had a "silent heart attack," a few weeks ago. Patients BP 120/74, pulse 84, RR 18, o2 100% on room air. No shortness of breath or respiratory distress noted. Patient has normal facial color, no pallor noted. No sweating or tremors noted. Provider made aware. Maalox given prn for indigestion.

## 2021-10-20 NOTE — Progress Notes (Signed)
   10/19/21 2200  Psych Admission Type (Psych Patients Only)  Admission Status Voluntary  Psychosocial Assessment  Patient Complaints Nervousness;Anxiety  Eye Contact Fair  Facial Expression Animated  Affect Appropriate to circumstance  Speech Rapid;Pressured  Interaction Childlike  Motor Activity Fidgety  Appearance/Hygiene Unremarkable  Behavior Characteristics Anxious  Mood Anxious;Pleasant  Thought Process  Coherency WDL  Content WDL  Delusions None reported or observed  Perception Hallucinations  Hallucination Auditory  Judgment Poor  Confusion None  Danger to Self  Current suicidal ideation? Denies  Self-Injurious Behavior No self-injurious ideation or behavior indicators observed or expressed   Agreement Not to Harm Self Yes  Description of Agreement verbal  Danger to Others  Danger to Others None reported or observed  Danger to Others Abnormal  Harmful Behavior to others No threats or harm toward other people  Destructive Behavior No threats or harm toward property

## 2021-10-20 NOTE — Group Note (Signed)
LCSW Group Therapy Note  Group Date: 10/20/2021 Start Time: 1300 End Time: 1400   Type of Therapy and Topic:  Group Therapy - How To Cope with Nervousness about Discharge   Participation Level:  Minimal   Description of Group This process group involved identification of patients' feelings about discharge. Some of them are scheduled to be discharged soon, while others are new admissions, but each of them was asked to share thoughts and feelings surrounding discharge from the hospital. One common theme was that they are excited at the prospect of going home, while another was that many of them are apprehensive about sharing why they were hospitalized. Patients were given the opportunity to discuss these feelings with their peers in preparation for discharge.  Therapeutic Goals  Patient will identify their overall feelings about pending discharge. Patient will think about how they might proactively address issues that they believe will once again arise once they get home (i.e. with parents). Patients will participate in discussion about having hope for change.   Summary of Patient Progress:  [name] was on the phone inside the group area and was disruptive, even when asked to hang up the phone.    Therapeutic Modalities Cognitive Behavioral Therapy   Windle Guard, LCSW 10/20/2021  2:47 PM

## 2021-10-20 NOTE — BH IP Treatment Plan (Signed)
Interdisciplinary Treatment and Diagnostic Plan Update  10/20/2021 Time of Session: 9:20am  [name] MRN: [id number]  Principal Diagnosis: Suicidal ideation  Secondary Diagnoses: Principal Problem:   Suicidal ideation   Current Medications:  Current Facility-Administered Medications  Medication Dose Route Frequency Provider Last Rate Last Admin   albuterol (VENTOLIN HFA) 108 (90 Base) MCG/ACT inhaler 2 puff  2 puff Inhalation Q6H PRN Tharon Aquas, NP       alum & mag hydroxide-simeth (MAALOX/MYLANTA) 200-200-20 MG/5ML suspension 30 mL  30 mL Oral Q4H PRN Tharon Aquas, NP       calcium carbonate (TUMS - dosed in mg elemental calcium) chewable tablet 1,500 mg  1,500 mg Oral BID PRN Tharon Aquas, NP       cariprazine (VRAYLAR) capsule 3 mg  3 mg Oral Daily Tharon Aquas, NP   3 mg at 10/20/21 0810   cyclobenzaprine (FLEXERIL) tablet 10 mg  10 mg Oral QHS Tharon Aquas, NP   10 mg at 10/19/21 2058   diclofenac Sodium (VOLTAREN) 1 % topical gel 2 g  2 g Topical QID Massengill, Ovid Curd, MD   2 g at 10/20/21 0810   [START ON 10/22/2021] docusate sodium (COLACE) capsule 100 mg  100 mg Oral QODAY Massengill, Ovid Curd, MD       escitalopram (LEXAPRO) tablet 15 mg  15 mg Oral Daily Tharon Aquas, NP   15 mg at 10/20/21 0810   ferrous sulfate tablet 325 mg  325 mg Oral Geanie Kenning, NP       fluticasone furoate-vilanterol (BREO ELLIPTA) 200-25 MCG/ACT 1 puff  1 puff Inhalation Daily Tharon Aquas, NP   1 puff at 10/20/21 0810   hydrOXYzine (ATARAX) tablet 25 mg  25 mg Oral TID PRN Tharon Aquas, NP       ibuprofen (ADVIL) tablet 600 mg  600 mg Oral Daily PRN Tharon Aquas, NP   600 mg at 10/19/21 0300   magnesium hydroxide (MILK OF MAGNESIA) suspension 30 mL  30 mL Oral Daily PRN Tharon Aquas, NP       neomycin-bacitracin-polymyxin (NEOSPORIN) ointment   Topical BID Janine Limbo, MD   1 Application at 97/67/34  0814   prazosin (MINIPRESS) capsule 1 mg  1 mg Oral QHS Tharon Aquas, NP   1 mg at 10/19/21 2059   pregabalin (LYRICA) capsule 50 mg  50 mg Oral Q8H Massengill, Ovid Curd, MD   50 mg at 10/20/21 1937   propranolol (INDERAL) tablet 10 mg  10 mg Oral BID Tharon Aquas, NP   10 mg at 10/20/21 0810   QUEtiapine (SEROQUEL) tablet 12.5 mg  12.5 mg Oral q morning Tharon Aquas, NP   12.5 mg at 10/19/21 1404   QUEtiapine (SEROQUEL) tablet 12.5 mg  12.5 mg Oral BID PRN Massengill, Ovid Curd, MD       QUEtiapine (SEROQUEL) tablet 25 mg  25 mg Oral QHS Tharon Aquas, NP   25 mg at 10/19/21 2059   PTA Medications: Medications Prior to Admission  Medication Sig Dispense Refill Last Dose   albuterol (VENTOLIN HFA) 108 (90 Base) MCG/ACT inhaler Inhale 2 puffs into the lungs every 6 (six) hours as needed for wheezing or shortness of breath.      budesonide-formoterol (SYMBICORT) 160-4.5 MCG/ACT inhaler Inhale 2 puffs into the lungs 2 (two) times daily.      calcium carbonate (TUMS EX) 750 MG chewable tablet Chew 1,500 mg by mouth  2 (two) times daily as needed for heartburn.      cariprazine (VRAYLAR) 3 MG capsule Take 1 capsule (3 mg total) by mouth daily. 30 capsule     cyclobenzaprine (FLEXERIL) 10 MG tablet Take 10 mg by mouth at bedtime.      diclofenac Sodium (VOLTAREN) 1 % GEL Apply 2 g topically 4 (four) times daily. 100 g 0    escitalopram (LEXAPRO) 5 MG tablet Take 3 tablets (15 mg total) by mouth daily. 30 tablet 0    ferrous sulfate 325 (65 FE) MG tablet Take 1 tablet (325 mg total) by mouth every other day. 15 tablet 0    gabapentin (NEURONTIN) 300 MG capsule Take 1 capsule (300 mg total) by mouth 3 (three) times daily for 7 days. 21 capsule 0    hydrOXYzine (ATARAX) 25 MG tablet Take 25 mg by mouth 3 (three) times daily as needed for anxiety.      ibuprofen (ADVIL) 200 MG tablet Take 600 mg by mouth daily as needed for moderate pain or headache.      Lidocaine-Menthol (ICY HOT  MAX LIDOCAINE EX) Apply 1 Application topically daily as needed (pain).      Multiple Vitamins-Minerals (EMERGEN-C IMMUNE PO) Take 1 packet by mouth daily as needed (Immune support).      pantoprazole (PROTONIX) 40 MG tablet TAKE 1 TABLET BY MOUTH DAILY (Patient taking differently: 40 mg daily.) 30 tablet 0    prazosin (MINIPRESS) 1 MG capsule Take 1 capsule (1 mg total) by mouth at bedtime for 7 days. 7 capsule 0    propranolol (INDERAL) 10 MG tablet Take 1 tablet (10 mg total) by mouth 2 (two) times daily.      QUEtiapine (SEROQUEL) 25 MG tablet Take 12.5-25 mg by mouth daily. Take one-half tablet (12.5 mg) in the morning and take one tablet (25 mg) at bedtime       Patient Stressors: Legal issue   Marital or family conflict    Patient Strengths: Capable of independent living  General fund of knowledge  Supportive family/friends  Work skills   Treatment Modalities: Medication Management, Group therapy, Case management,  1 to 1 session with clinician, Psychoeducation, Recreational therapy.   Physician Treatment Plan for Primary Diagnosis: Suicidal ideation Long Term Goal(s):     Short Term Goals:    Medication Management: Evaluate patient's response, side effects, and tolerance of medication regimen.  Therapeutic Interventions: 1 to 1 sessions, Unit Group sessions and Medication administration.  Evaluation of Outcomes: Not Met  Physician Treatment Plan for Secondary Diagnosis: Principal Problem:   Suicidal ideation  Long Term Goal(s):     Short Term Goals:       Medication Management: Evaluate patient's response, side effects, and tolerance of medication regimen.  Therapeutic Interventions: 1 to 1 sessions, Unit Group sessions and Medication administration.  Evaluation of Outcomes: Not Met   RN Treatment Plan for Primary Diagnosis: Suicidal ideation Long Term Goal(s): Knowledge of disease and therapeutic regimen to maintain health will improve  Short Term Goals:  Ability to remain free from injury will improve, Ability to participate in decision making will improve, Ability to verbalize feelings will improve, Ability to disclose and discuss suicidal ideas, and Ability to identify and develop effective coping behaviors will improve  Medication Management: RN will administer medications as ordered by provider, will assess and evaluate patient's response and provide education to patient for prescribed medication. RN will report any adverse and/or side effects to prescribing provider.  Therapeutic  Interventions: 1 on 1 counseling sessions, Psychoeducation, Medication administration, Evaluate responses to treatment, Monitor vital signs and CBGs as ordered, Perform/monitor CIWA, COWS, AIMS and Fall Risk screenings as ordered, Perform wound care treatments as ordered.  Evaluation of Outcomes: Not Met   LCSW Treatment Plan for Primary Diagnosis: Suicidal ideation Long Term Goal(s): Safe transition to appropriate next level of care at discharge, Engage patient in therapeutic group addressing interpersonal concerns.  Short Term Goals: Engage patient in aftercare planning with referrals and resources, Increase social support, Increase emotional regulation, Facilitate acceptance of mental health diagnosis and concerns, Identify triggers associated with mental health/substance abuse issues, and Increase skills for wellness and recovery  Therapeutic Interventions: Assess for all discharge needs, 1 to 1 time with Social worker, Explore available resources and support systems, Assess for adequacy in community support network, Educate family and significant other(s) on suicide prevention, Complete Psychosocial Assessment, Interpersonal group therapy.  Evaluation of Outcomes: Not Met   Progress in Treatment: Attending groups: Yes. Participating in groups: Yes. Taking medication as prescribed: Yes. Toleration medication: Yes. Family/Significant other contact made: No,  will contact:  Patient declined consents  Patient understands diagnosis: No. Discussing patient identified problems/goals with staff: Yes. Medical problems stabilized or resolved: Yes. Denies suicidal/homicidal ideation: Yes. Issues/concerns per patient self-inventory: No.   New problem(s) identified: No, Describe:  None   New Short Term/Long Term Goal(s): medication stabilization, elimination of SI thoughts, development of comprehensive mental wellness plan.   Patient Goals:  "Get my medications straight and get rid of the voices"   Discharge Plan or Barriers: Patient recently admitted. CSW will continue to follow and assess for appropriate referrals and possible discharge planning.   Reason for Continuation of Hospitalization: Anxiety Depression Hallucinations Medication stabilization Suicidal ideation  Estimated Length of Stay: 3 to 7 days   Last 3 [place] Suicide Severity Risk Score: Flowsheet Row Admission (Current) from 10/18/2021 in [place] [facility] Most recent reading at 10/18/2021 10:54 PM ED from 10/18/2021 in [facility] Most recent reading at 10/18/2021  3:55 PM ED from 10/11/2021 in [place] Most recent reading at 10/11/2021  3:31 AM  C-SSRS RISK CATEGORY High Risk High Risk High Risk       Last PHQ 2/9 Scores:    06/27/2021    3:18 AM 05/01/2021    2:16 AM 02/03/2020    2:24 AM  Depression screen PHQ 2/9  Decreased Interest _0 Down, Depressed, Hopeless _1 PHQ - 2 Score _2 Altered sleeping 0 0 3  Tired, decreased energy _3 Change in appetite 0 1 3  Feeling bad or failure about yourself  _4 Trouble concentrating _5 Moving slowly or fidgety/restless _6 Suicidal thoughts _7 PHQ-9 Score _8 Difficult doing work/chores Somewhat difficult Extremely dIfficult     Scribe for Treatment Team: Darleen Crocker, Latanya Presser 10/20/2021 12:40  PM

## 2021-10-20 NOTE — Progress Notes (Signed)
[facility] MD Progress Note   10/20/21 10:08 [name]  [id number]    Subjective:     [name] is a 31 y.o. female with psychiatric hx of schizoaffective disorder bipolar type, anxiety, autism spectrum disorder, PTSD, and borderline personality disorder who presented to [facility] voluntarily from the ED on 10/18/21 for suicidal ideations with plans to overdose on her medications and "bang my head against glass" due to hearing voices that tell her "kill yourself" and "you're worthless".    Yesterday the psychiatry team made the following recommendations: - Continue home Vraylar 3 mg daily - for schizoaffective disorder, bipolar type - Start additional dose of Seroquel 12.5 mg daily in the afternoon (2pm) - for schizoaffective disorder, bipolar type - Continue home Seroquel 12.5 mg every morning (8am) and 25 mg nightly - Continue home Lexapro 15 mg daily - for GAD - Change Gabapentin 300 mg TID to Lyrica 50 mg q8 hours - for GAD and fibromyalgia - Continue Flexeril 10 mg nightly - for fibromyalgia - Continue Propranolol 10 mg BID - for anxiety - Continue Prazosin 1 mg nightly - for PTSD   On assessment today,  she reports that mood is less depressed. She is less irritable. She reports that AH are less and denies any CAH. She states her voices are more quiet and they come and go. She states the Seroquel is working, but makes her sleepy. 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 She mentions she will need a note for work. Reports appetite is okay. Concentration is okay.  She reports that suicidal thoughts have ceased since our last interview yesterday. Denies HI.  Her pain is 1-2/10 on the Lyrica, which she says is a great improvement. OMT was discussed and offered by the DO medical student, but she declined because she was uncomfortable with the physical nature of the treatment.    Principal Problem: Schizoaffective disorder, bipolar type Diagnosis:  Borderline personality disorder    PTSD (post-traumatic stress disorder)    GAD (generalized anxiety disorder)    Suicidal ideation    Fibromyalgia     Autism spectrum disorder  Total Time spent with patient: 15 minutes   Past Psychiatric Hx: Previous Psych Diagnoses: Schizoaffective disorder bipolar type, Anxiety, Autism spectrum disorder, PTSD, Borderline personality disorder  Prior inpatient treatment: Multiple, over 20 inpatient admissions over the course of her lifetime, 6 inpatient admissions in the past 6 months Outpatient treatment: has ACT team  Prior rehab hx: Denies History of suicide: multiple attempts, most recently a prescription overdose 2 weeks ago History of homicide: Denies Psychiatric medication history: Previously trialed Tegretol, Haldol, Clozaril, Depakote, Invega, and Lamictal  Psychiatric medication compliance history: Poor   Past Medical History:  Diagnosis Date   Acid reflux    Anxiety    Asthma    last attack 03/13/15 or 03/14/15   Autism    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 fragile X syndrome    Chronic constipation    Depression    Drug-seeking behavior    Essential tremor    Headache    Overdose of acetaminophen 07/2017   and other meds   Personality disorder ([place])    Schizo-affective psychosis ([place])    Schizoaffective disorder, bipolar type ([place])    Seizures ([place])    Last seizure December 2017   Sleep apnea     Family Medical History: Family History  Problem Relation Age of Onset   Mental illness Father    Asthma Father  PDD Brother    Seizures Brother      Family Psychiatric history: Medical: Mom-skin cancer, maternal grandpa-type 2 diabetes and heart disease, maternal grandma-thyroid and heart problems, dad-COPD, brother-asthma Psych: Dad-depression, brother, bipolar 1 disorder Psych Rx: Unsure SA/HA: 2 paternal great uncles completed suicide as well as ex-husband Substance use family hx:  paternal uncle-alcohol use disorder  Social History    Socioeconomic History   Marital status: Widowed    Spouse name: Not on file   Number of children: 0   Years of education: Not on file   Highest education level: Not on file  Occupational History   Occupation: disability  Tobacco Use   Smoking status: Former    Packs/day: 0.00    Types: Cigarettes   Smokeless tobacco: Never   Tobacco comments:    Smoked for 2  years age [age]  Vaping Use   Vaping Use: Never used  Substance and Sexual Activity   Alcohol use: No    Alcohol/week: 1.0 standard drink of alcohol    Types: 1 Standard drinks or equivalent per week    Comment: denies at this time   Drug use: No    Comment: History of cocaine use at age 70 for 4 months   Sexual activity: Not Currently    Birth control/protection: None  Other Topics Concern   Not on file  Social History Narrative   Marital status: Widowed      Children: daughter      Lives: with boyfriend, in two story home      Employment:  Disability      Tobacco: quit smoking; smoked for two years.      Alcohol ;none      Drugs: none   Has not traveled outside of the country.   Right handed         Social Determinants of Health   Financial Resource Strain: Not on file  Food Insecurity: No Food Insecurity (10/18/2021)   Hunger Vital Sign    Worried About Running Out of Food in the Last Year: Never true    Ran Out of Food in the Last Year: Never true  Transportation Needs: No Transportation Needs (10/18/2021)   PRAPARE - Hydrologist (Medical): No    Lack of Transportation (Non-Medical): No  Physical Activity: Not on file  Stress: Not on file  Social Connections: Not on file  Intimate Partner Violence: Not At Risk (10/18/2021)   Humiliation, Afraid, Rape, and Kick questionnaire    Fear of Current or Ex-Partner: No    Emotionally Abused: No    Physically Abused: No    Sexually Abused: No    Sleep: Fair   Appetite: Fair  Current Medications:   Current  Facility-Administered Medications:    albuterol (VENTOLIN HFA) 108 (90 Base) MCG/ACT inhaler 2 puff, 2 puff, Inhalation, Q6H PRN, Evelyn Aquas, NP   alum & mag hydroxide-simeth (MAALOX/MYLANTA) 200-200-20 MG/5ML suspension 30 mL, 30 mL, Oral, Q4H PRN, Evelyn Aquas, NP   calcium carbonate (TUMS - dosed in mg elemental calcium) chewable tablet 1,500 mg, 1,500 mg, Oral, BID PRN, Evelyn Aquas, NP   cariprazine (VRAYLAR) capsule 3 mg, 3 mg, Oral, Daily, Evelyn Aquas, NP, 3 mg at 10/20/21 0810   cyclobenzaprine (FLEXERIL) tablet 10 mg, 10 mg, Oral, QHS, Evelyn Aquas, NP, 10 mg at 10/19/21 2058   diclofenac Sodium (VOLTAREN) 1 % topical gel 2 g, 2 g, Topical, QID, Leiyah Maultsby, Ovid Curd,  MD, 2 g at 10/20/21 0810   docusate sodium (COLACE) capsule 100 mg, 100 mg, Oral, Daily, Charlii Yost, MD, 100 mg at 10/20/21 0810   escitalopram (LEXAPRO) tablet 15 mg, 15 mg, Oral, Daily, Evelyn Aquas, NP, 15 mg at 10/20/21 0810   ferrous sulfate tablet 325 mg, 325 mg, Oral, QODAY, Evelyn Aquas, NP   fluticasone furoate-vilanterol (BREO ELLIPTA) 200-25 MCG/ACT 1 puff, 1 puff, Inhalation, Daily, Evelyn Aquas, NP, 1 puff at 10/20/21 0810   hydrOXYzine (ATARAX) tablet 25 mg, 25 mg, Oral, TID PRN, Evelyn Aquas, NP   ibuprofen (ADVIL) tablet 600 mg, 600 mg, Oral, Daily PRN, Evelyn Aquas, NP, 600 mg at 10/19/21 0300   magnesium hydroxide (MILK OF MAGNESIA) suspension 30 mL, 30 mL, Oral, Daily PRN, Evelyn Aquas, NP   neomycin-bacitracin-polymyxin (NEOSPORIN) ointment, , Topical, BID, Janine Limbo, MD, 1 Application at 03/54/65 0814   prazosin (MINIPRESS) capsule 1 mg, 1 mg, Oral, QHS, Evelyn Aquas, NP, 1 mg at 10/19/21 2059   pregabalin (LYRICA) capsule 50 mg, 50 mg, Oral, Q8H, Jden Want, MD, 50 mg at 10/20/21 6812   propranolol (INDERAL) tablet 10 mg, 10 mg, Oral, BID, Evelyn Aquas, NP, 10 mg at 10/20/21 0810    QUEtiapine (SEROQUEL) tablet 12.5 mg, 12.5 mg, Oral, q morning, Evelyn Aquas, NP, 12.5 mg at 10/19/21 1404   QUEtiapine (SEROQUEL) tablet 12.5 mg, 12.5 mg, Oral, Q0600, Talishia Betzler, Ovid Curd, MD, 12.5 mg at 10/19/21 1405   QUEtiapine (SEROQUEL) tablet 25 mg, 25 mg, Oral, QHS, Evelyn Aquas, NP, 25 mg at 10/19/21 2059    Lab results: Results for orders placed or performed during the hospital encounter of 10/18/21 (from the past 48 hour(s))  Resp Panel by RT-PCR (Flu A&B, Covid) Anterior Nasal Swab     Status: None   Collection Time: 10/18/21  3:27 PM   Specimen: Anterior Nasal Swab  Result Value Ref Range   SARS Coronavirus 2 by RT PCR NEGATIVE NEGATIVE    Comment: (NOTE) SARS-CoV-2 target nucleic acids are NOT DETECTED.  The SARS-CoV-2 RNA is generally detectable in upper respiratory specimens during the acute phase of infection. The lowest concentration of SARS-CoV-2 viral copies this assay can detect is 138 copies/mL. A negative result does not preclude SARS-Cov-2 infection and should not be used as the sole basis for treatment or other patient management decisions. A negative result may occur with  improper specimen collection/handling, submission of specimen other than nasopharyngeal swab, presence of viral mutation(s) within the areas targeted by this assay, and inadequate number of viral copies(<138 copies/mL). A negative result must be combined with clinical observations, patient history, and epidemiological information. The expected result is Negative.  Fact Sheet for Patients:  EntrepreneurPulse.com.au  Fact Sheet for Healthcare Providers:  IncredibleEmployment.be  This test is no t yet approved or cleared by the [place] FDA and  has been authorized for detection and/or diagnosis of SARS-CoV-2 by FDA under an Emergency Use Authorization (EUA). This EUA will remain  in effect (meaning this test can be used) for the  duration of the COVID-19 declaration under Section 564(b)(1) of the Act, 21 U.S.C.section 360bbb-3(b)(1), unless the authorization is terminated  or revoked sooner.       Influenza A by PCR NEGATIVE NEGATIVE   Influenza B by PCR NEGATIVE NEGATIVE    Comment: (NOTE) The Xpert Xpress SARS-CoV-2/FLU/RSV plus assay is intended as an aid in the diagnosis of influenza from Nasopharyngeal swab specimens and should  not be used as a sole basis for treatment. Nasal washings and aspirates are unacceptable for Xpert Xpress SARS-CoV-2/FLU/RSV testing.  Fact Sheet for Patients: EntrepreneurPulse.com.au  Fact Sheet for Healthcare Providers: IncredibleEmployment.be  This test is not yet approved or cleared by the [place] FDA and has been authorized for detection and/or diagnosis of SARS-CoV-2 by FDA under an Emergency Use Authorization (EUA). This EUA will remain in effect (meaning this test can be used) for the duration of the COVID-19 declaration under Section 564(b)(1) of the Act, 21 U.S.C. section 360bbb-3(b)(1), unless the authorization is terminated or revoked.  Performed at [facility], [place] [address] [place], [place] [address]   POCT Urine Drug Screen - (I-Screen)     Status: Normal   Collection Time: 10/18/21  3:27 PM  Result Value Ref Range   POC Amphetamine UR None Detected NONE DETECTED (Cut Off Level 1000 ng/mL)   POC Secobarbital (BAR) None Detected NONE DETECTED (Cut Off Level 300 ng/mL)   POC Buprenorphine (BUP) None Detected NONE DETECTED (Cut Off Level 10 ng/mL)   POC Oxazepam (BZO) None Detected NONE DETECTED (Cut Off Level 300 ng/mL)   POC Cocaine UR None Detected NONE DETECTED (Cut Off Level 300 ng/mL)   POC Methamphetamine UR None Detected NONE DETECTED (Cut Off Level 1000 ng/mL)   POC Morphine None Detected NONE DETECTED (Cut Off Level 300 ng/mL)   POC Methadone UR None Detected NONE DETECTED (Cut Off Level 300 ng/mL)    POC Oxycodone UR None Detected NONE DETECTED (Cut Off Level 100 ng/mL)   POC Marijuana UR None Detected NONE DETECTED (Cut Off Level 50 ng/mL)  Pregnancy, urine POC     Status: None   Collection Time: 10/18/21  3:36 PM  Result Value Ref Range   Preg Test, Ur NEGATIVE NEGATIVE    Comment:        THE SENSITIVITY OF THIS METHODOLOGY IS >24 mIU/mL   POC SARS Coronavirus 2 Ag     Status: None   Collection Time: 10/18/21  3:37 PM  Result Value Ref Range   SARSCOV2ONAVIRUS 2 AG NEGATIVE NEGATIVE    Comment: (NOTE) SARS-CoV-2 antigen NOT DETECTED.   Negative results are presumptive.  Negative results do not preclude SARS-CoV-2 infection and should not be used as the sole basis for treatment or other patient management decisions, including infection  control decisions, particularly in the presence of clinical signs and  symptoms consistent with COVID-19, or in those who have been in contact with the virus.  Negative results must be combined with clinical observations, patient history, and epidemiological information. The expected result is Negative.  Fact Sheet for Patients: HandmadeRecipes.com.cy  Fact Sheet for Healthcare Providers: FuneralLife.at  This test is not yet approved or cleared by the [place] FDA and  has been authorized for detection and/or diagnosis of SARS-CoV-2 by FDA under an Emergency Use Authorization (EUA).  This EUA will remain in effect (meaning this test can be used) for the duration of  the COV ID-19 declaration under Section 564(b)(1) of the Act, 21 U.S.C. section 360bbb-3(b)(1), unless the authorization is terminated or revoked sooner.     . Physical Exam: Psychiatric Specialty Exam: Physical Exam Neurological:     Mental Status: She is alert and oriented to person, place, and time.  Psychiatric:        Attention and Perception: Attention normal. She perceives auditory hallucinations.        Mood  and Affect: Affect normal. Mood is depressed.  Speech: Speech normal.        Behavior: Behavior normal. Behavior is cooperative.        Thought Content: Thought content normal.        Cognition and Memory: Cognition and memory normal.        Judgment: Judgment normal.     Review of Systems  Musculoskeletal:  Positive for back pain (2/10).  Psychiatric/Behavioral:  Positive for hallucinations. Negative for sleep disturbance and suicidal ideas.     Blood pressure 117/80, pulse 89, temperature 98.1 F (36.7 C), temperature source Oral, resp. rate 18, height '5\' 5"'$  (1.651 m), weight 92.1 kg, SpO2 100 %.Body mass index is 33.78 kg/m.  General Appearance: Fairly Groomed  Eye Contact:  Fair  Speech:  Clear and Coherent  Volume:  Normal  Mood:  Depressed   Affect:  Congruent and Depressed  Thought Process:  Linear  Orientation:  Full (Time, Place, and Person)  Thought Content:  Hallucinations: Auditory  Suicidal Thoughts:  No  Homicidal Thoughts:  No  Memory:  Full memory intact.   Judgement:  Impaired  Insight:  Fair  Psychomotor Activity:  Negative  Concentration:  Concentration: Fair  Recall:  [employer] of Knowledge:  Fair  Language:  Fair  Akathisia:  No  Handed:  Right  AIMS (if indicated):  0   Assets:   Desire for Improvement Resilience Vocational/Educational  ADL's:  Intact  Cognition:  Intact  Sleep:  Number of Hours: 5.25    Treatment Plan Summary: Daily contact with patient to assess and evaluate symptoms and progress in treatment and Medication management.    Physician Treatment Plan for Primary Diagnosis: Principal Problem:    Schizoaffective disorder, bipolar type Active Problems:    Borderline personality disorder    PTSD (post-traumatic stress disorder)    GAD (generalized anxiety disorder)    Suicidal ideation    Fibromyalgia     Autism spectrum disorder   Safety and Monitoring: Voluntary admission to inpatient psychiatric unit for safety,  stabilization and treatment Daily contact with patient to assess and evaluate symptoms and progress in treatment Appropriate medication management to further stabilize patient Patient's case will be regularly discussed in multi-disciplinary team meeting Observation Level : q15 minute checks Vital signs: q12 hours Precautions: suicide   2. Psychiatric Problems - Continue home Vraylar 3 mg daily - for schizoaffective disorder, bipolar type - Discontinue additional dose of Seroquel 12.5 mg daily in the afternoon (2pm) - for schizoaffective disorder, bipolar type - Start seroquel 12.5 mg bid prn for anxiety and hallucinations  - Continue home Seroquel 12.5 mg every morning (8am) and 25 mg nightly - Continue home Lexapro 15 mg daily - for GAD - Continue Lyrica 50 mg q8 hours - for GAD and fibromyalgia - Continue Flexeril 10 mg nightly - for fibromyalgia - Continue Propranolol 10 mg BID - for anxiety - Continue Prazosin 1 mg nightly - for PTSD   3. Medical Management - Continue Neosporin ointment BID - for healing acne lesion - Continue home Oral Iron every other day - Continue Breo Ellipta 1 puff daily  - Continue home Albuterol 2 puffs PRN    Other PRNs -Tylenol 650 mg q6hr PRN for mild pain -Mylanta 30 ml suspension for indigestion -Milk of Magnesia 30 ml for constipation -Hydroxyzine 25 mg tid PRN for anxiety   4. Discharge Planning Patient will require the following based on my assessment:  Greatly appreciate CSW and Case management assistance with facilitating these needs and  any further recommendations regarding patient's needs upon discharge. Estimated LOS: 5-7 days Discharge Concerns:  Discharge Goals: Return home with outpatient referrals for mental health follow-up including medication management/psychotherapy  Vonna Drafts OMS-IV   Total Time Spent in Direct Patient Care:  I personally spent 35 minutes on the unit in direct patient care. The direct patient care time  included face-to-face time with the patient, reviewing the patient's chart, communicating with other professionals, and coordinating care. Greater than 50% of this time was spent in counseling or coordinating care with the patient regarding goals of hospitalization, psycho-education, and discharge planning needs.  I personally was present and performed or re-performed the history, physical exam and medical decision-making activities of this service and have verified that the service and findings are accurately documented in the student's note, , as addended by me or notated below:  I directly edited the note, as above.   Janine Limbo, MD Psychiatrist

## 2021-10-20 NOTE — [facility] Suicide Risk Assessment (Signed)
[facility] INPATIENT:  Family/Significant Other Suicide Prevention Education  Suicide Prevention Education:  Patient Refusal for Family/Significant Other Suicide Prevention Education: The patient [name] has refused to provide written consent for family/significant other to be provided Family/Significant Other Suicide Prevention Education during admission and/or prior to discharge.  Physician notified.  Darleen Crocker 10/20/2021, 12:39 PM

## 2021-10-21 DIAGNOSIS — R45851 Suicidal ideations: Secondary | ICD-10-CM

## 2021-10-21 MED ORDER — ONDANSETRON 4 MG PO TBDP
4.0000 mg | ORAL_TABLET | Freq: Three times a day (TID) | ORAL | Status: DC | PRN
  Administered 2021-10-21 – 2021-10-23 (×3): 4 mg via ORAL
  Filled 2021-10-21 (×2): qty 1

## 2021-10-21 MED ORDER — ONDANSETRON 4 MG PO TBDP
ORAL_TABLET | ORAL | Status: AC
  Filled 2021-10-21: qty 1

## 2021-10-21 NOTE — Progress Notes (Signed)
Pt is A&OX4, calm, flat, isolative, irritable,  denies suicidal ideations, denies homicidal ideations, denies auditory hallucinations and denies visual hallucinations. Pt verbally agrees to approach staff if these become apparent and before harming self or others. Pt denies experiencing nightmares. Mood and affect are congruent. Pt appetite is ok. Pt's memory appears to be grossly intact, and Pt hasn't displayed any injurious behaviors. Pt is medication compliant. There's no evidence of suicidal intent. Psychomotor activity was WNL. No s/s of Parkinson, Dystonia, Akathisia and/or Tardive Dyskinesia noted.

## 2021-10-21 NOTE — [facility] Group Notes (Signed)
.  Psychoeducational Group Note    Date:  10/21/2021 Time: 1300-1400    Purpose of Group: . The group focus' on teaching patients on how to identify their needs and their Life Skills:  A group where two lists are made. What people need and what are things that we do that are unhealthy. The lists are developed by the patients and it is explained that we often do the actions that are not healthy to get our list of needs met.  Goal:: to develop the coping skills needed to get their needs met  Participation Level:  did not attend  [name]

## 2021-10-21 NOTE — Progress Notes (Addendum)
[facility] MD Progress Note   10/20/21 10:08 [name]  [id number]    Subjective:     "[name] is a 31 y.o. female with psychiatric hx of schizoaffective disorder bipolar type, anxiety, autism spectrum disorder, PTSD, and borderline personality disorder who presented to [facility] voluntarily from the ED on 10/18/21 for suicidal ideations with plans to overdose on her medications and "bang my head against glass" due to hearing voices that tell her "kill yourself" and "you're worthless".   On assessment today, the patient was seen lying down on my bed.  She reports having monthly cramps due to her monthly cycle.  She reports doing well with her depression, states her mood is less depressed she is not irritable except for her period.  She reports her hallucinations is well-controlled with her as needed Seroquel.  States her anxiety is less and her lyrical is very effective.  States she is much better and she wants to be discharged.  The Patient's grooming and hygiene were adequately maintained. She was alert and correctly oriented to his person, place, situation, and time. Attention and memory functions were assessed as grossly intact. His/her speech was normal in rate, rhythm, and volume; no significant articulation deficits were noted. His thought process was logical. Her communications were consistently goal-directed and relevant to the questions asked. No delusional material was elicited. She showed no signs of response to internal stimuli and denied experiencing perceptual disturbances at this time. No mood disturbances were endorsed or observed. She did not report current suicidal ideation.  Patient will continue current plan of care. - Continue home Vraylar 3 mg daily - for schizoaffective disorder, bipolar type - Discontinue additional dose of Seroquel 12.5 mg daily in the afternoon (2pm) - for schizoaffective disorder, bipolar type - Start seroquel 12.5 mg bid prn for anxiety and  hallucinations  - Continue home Seroquel 12.5 mg every morning (8am) and 25 mg nightly - Continue home Lexapro 15 mg daily - for GAD - Continue Lyrica 50 mg q8 hours - for GAD and fibromyalgia - Continue Flexeril 10 mg nightly - for fibromyalgia - Continue Propranolol 10 mg BID - for anxiety - Continue Prazosin 1 mg nightly - for PTSD   Principal Problem: Schizoaffective disorder, bipolar type Diagnosis: Borderline personality disorder    PTSD (post-traumatic stress disorder)    GAD (generalized anxiety disorder)    Suicidal ideation    Fibromyalgia     Autism spectrum disorder  Total Time spent with patient: 15 minutes   Past Psychiatric Hx: Previous Psych Diagnoses: Schizoaffective disorder bipolar type, Anxiety, Autism spectrum disorder, PTSD, Borderline personality disorder  Prior inpatient treatment: Multiple, over 20 inpatient admissions over the course of her lifetime, 6 inpatient admissions in the past 6 months Outpatient treatment: has ACT team  Prior rehab hx: Denies History of suicide: multiple attempts, most recently a prescription overdose 2 weeks ago History of homicide: Denies Psychiatric medication history: Previously trialed Tegretol, Haldol, Clozaril, Depakote, Invega, and Lamictal  Psychiatric medication compliance history: Poor   Past Medical History:  Diagnosis Date   Acid reflux    Anxiety    Asthma    last attack 03/13/15 or 03/14/15   Autism    Carrier of fragile X syndrome    Chronic constipation    Depression    Drug-seeking behavior    Essential tremor    Headache    Overdose of acetaminophen 07/2017   and other meds   Personality disorder ([place])  Schizo-affective psychosis ([place])    Schizoaffective disorder, bipolar type ([place])    Seizures ([place])    Last seizure December 2017   Sleep apnea     Family Medical History: Family History  Problem Relation Age of Onset   Mental illness Father    Asthma Father    PDD Brother    Seizures Brother       Family Psychiatric history: Medical: Mom-skin cancer, maternal grandpa-type 2 diabetes and heart disease, maternal grandma-thyroid and heart problems, dad-COPD, brother-asthma Psych: Dad-depression, brother, bipolar 1 disorder Psych Rx: Unsure SA/HA: 2 paternal great uncles completed suicide as well as ex-husband Substance use family hx:  paternal uncle-alcohol use disorder  Social History   Socioeconomic History   Marital status: Widowed    Spouse name: Not on file   Number of children: 0   Years of education: Not on file   Highest education level: Not on file  Occupational History   Occupation: disability  Tobacco Use   Smoking status: Former    Packs/day: 0.00    Types: Cigarettes   Smokeless tobacco: Never   Tobacco comments:    Smoked for 2  years age [age]  Vaping Use   Vaping Use: Never used  Substance and Sexual Activity   Alcohol use: No    Alcohol/week: 1.0 standard drink of alcohol    Types: 1 Standard drinks or equivalent per week    Comment: denies at this time   Drug use: No    Comment: History of cocaine use at age 54 for 4 months   Sexual activity: Not Currently    Birth control/protection: None  Other Topics Concern   Not on file  Social History Narrative   Marital status: Widowed      Children: daughter      Lives: with boyfriend, in two story home      Employment:  Disability      Tobacco: quit smoking; smoked for two years.      Alcohol ;none      Drugs: none   Has not traveled outside of the country.   Right handed         Social Determinants of Health   Financial Resource Strain: Not on file  Food Insecurity: No Food Insecurity (10/18/2021)   Hunger Vital Sign    Worried About Running Out of Food in the Last Year: Never true    Ran Out of Food in the Last Year: Never true  Transportation Needs: No Transportation Needs (10/18/2021)   PRAPARE - Hydrologist (Medical): No    Lack of Transportation  (Non-Medical): No  Physical Activity: Not on file  Stress: Not on file  Social Connections: Not on file  Intimate Partner Violence: Not At Risk (10/18/2021)   Humiliation, Afraid, Rape, and Kick questionnaire    Fear of Current or Ex-Partner: No    Emotionally Abused: No    Physically Abused: No    Sexually Abused: No    Sleep: Fair   Appetite: Fair  Current Medications:   Current Facility-Administered Medications:    albuterol (VENTOLIN HFA) 108 (90 Base) MCG/ACT inhaler 2 puff, 2 puff, Inhalation, Q6H PRN, Tharon Aquas, NP   alum & mag hydroxide-simeth (MAALOX/MYLANTA) 200-200-20 MG/5ML suspension 30 mL, 30 mL, Oral, Q4H PRN, Tharon Aquas, NP, 30 mL at 10/20/21 1815   calcium carbonate (TUMS - dosed in mg elemental calcium) chewable tablet 1,500 mg, 1,500 mg, Oral, BID PRN, Truman Hayward,  Phyllis Ginger, NP   cariprazine (VRAYLAR) capsule 3 mg, 3 mg, Oral, Daily, Tharon Aquas, NP, 3 mg at 10/21/21 9937   cyclobenzaprine (FLEXERIL) tablet 10 mg, 10 mg, Oral, QHS, Tharon Aquas, NP, 10 mg at 10/20/21 2109   diclofenac Sodium (VOLTAREN) 1 % topical gel 2 g, 2 g, Topical, QID, Massengill, Nathan, MD, 2 g at 10/21/21 1230   [START ON 10/22/2021] docusate sodium (COLACE) capsule 100 mg, 100 mg, Oral, QODAY, Massengill, Nathan, MD   escitalopram (LEXAPRO) tablet 15 mg, 15 mg, Oral, Daily, Tharon Aquas, NP, 15 mg at 10/21/21 0825   ferrous sulfate tablet 325 mg, 325 mg, Oral, Geanie Kenning, NP, 325 mg at 10/20/21 1314   fluticasone furoate-vilanterol (BREO ELLIPTA) 200-25 MCG/ACT 1 puff, 1 puff, Inhalation, Daily, Tharon Aquas, NP, 1 puff at 10/21/21 0826   hydrOXYzine (ATARAX) tablet 25 mg, 25 mg, Oral, TID PRN, Tharon Aquas, NP, 25 mg at 10/20/21 2112   ibuprofen (ADVIL) tablet 600 mg, 600 mg, Oral, Daily PRN, Tharon Aquas, NP, 600 mg at 10/21/21 1696   magnesium hydroxide (MILK OF MAGNESIA) suspension 30 mL, 30 mL, Oral, Daily PRN,  Tharon Aquas, NP   neomycin-bacitracin-polymyxin (NEOSPORIN) ointment, , Topical, BID, Janine Limbo, MD, Given at 10/21/21 0826   prazosin (MINIPRESS) capsule 1 mg, 1 mg, Oral, QHS, Tharon Aquas, NP, 1 mg at 10/20/21 2108   pregabalin (LYRICA) capsule 50 mg, 50 mg, Oral, Q8H, Massengill, Nathan, MD, 50 mg at 10/21/21 1315   propranolol (INDERAL) tablet 10 mg, 10 mg, Oral, BID, Tharon Aquas, NP, 10 mg at 10/21/21 7893   QUEtiapine (SEROQUEL) tablet 12.5 mg, 12.5 mg, Oral, q morning, Tharon Aquas, NP, 12.5 mg at 10/21/21 1001   QUEtiapine (SEROQUEL) tablet 12.5 mg, 12.5 mg, Oral, BID PRN, Massengill, Ovid Curd, MD, 12.5 mg at 10/21/21 1249   QUEtiapine (SEROQUEL) tablet 25 mg, 25 mg, Oral, QHS, Tharon Aquas, NP, 25 mg at 10/20/21 2109    Lab results: No results found for this or any previous visit (from the past 48 hour(s)).  Marland Kitchen Physical Exam: Psychiatric Specialty Exam: Physical Exam Eyes:     Pupils: Pupils are equal, round, and reactive to light.  Skin:    General: Skin is warm and dry.  Neurological:     Mental Status: She is alert and oriented to person, place, and time.  Psychiatric:        Attention and Perception: Attention normal. She perceives auditory hallucinations.        Mood and Affect: Affect normal. Mood is depressed.        Speech: Speech normal.        Behavior: Behavior normal. Behavior is cooperative.        Thought Content: Thought content normal.        Cognition and Memory: Cognition and memory normal.        Judgment: Judgment normal.     Review of Systems  Musculoskeletal:  Positive for back pain (2/10).  Psychiatric/Behavioral:  Positive for hallucinations. Negative for sleep disturbance and suicidal ideas.     Blood pressure 108/61, pulse 94, temperature 98.2 F (36.8 C), temperature source Oral, resp. rate 18, height '5\' 5"'$  (1.651 m), weight 92.1 kg, SpO2 98 %.Body mass index is 33.78 kg/m.  General Appearance:  Fairly Groomed  Eye Contact:  Fair  Speech:  Clear and Coherent  Volume:  Normal  Mood:  Depressed   Affect:  Congruent and Depressed  Thought Process:  Linear  Orientation:  Full (Time, Place, and Person)  Thought Content:  Hallucinations: Auditory  Suicidal Thoughts:  No  Homicidal Thoughts:  No  Memory:  Full memory intact.   Judgement:  Impaired  Insight:  Fair  Psychomotor Activity:  Negative  Concentration:  Concentration: Fair  Recall:  [employer] of Knowledge:  Fair  Language:  Fair  Akathisia:  No  Handed:  Right  AIMS (if indicated):  0   Assets:   Desire for Improvement Resilience Vocational/Educational  ADL's:  Intact  Cognition:  Intact  Sleep:  Number of Hours: 5.25    Treatment Plan Summary: Daily contact with patient to assess and evaluate symptoms and progress in treatment and Medication management.    Physician Treatment Plan for Primary Diagnosis: Principal Problem:    Schizoaffective disorder, bipolar type Active Problems:    Borderline personality disorder    PTSD (post-traumatic stress disorder)    GAD (generalized anxiety disorder)    Suicidal ideation    Fibromyalgia     Autism spectrum disorder   Safety and Monitoring: Voluntary admission to inpatient psychiatric unit for safety, stabilization and treatment Daily contact with patient to assess and evaluate symptoms and progress in treatment Appropriate medication management to further stabilize patient Patient's case will be regularly discussed in multi-disciplinary team meeting Observation Level : q15 minute checks Vital signs: q12 hours Precautions: suicide   2. Psychiatric Problems - Continue home Vraylar 3 mg daily - for schizoaffective disorder, bipolar type - Discontinue additional dose of Seroquel 12.5 mg daily in the afternoon (2pm) - for schizoaffective disorder, bipolar type - Continue seroquel 12.5 mg bid prn for anxiety and hallucinations  - Continue home Seroquel 12.5 mg  every morning (8am) and 25 mg nightly - Continue home Lexapro 15 mg daily - for GAD - Continue Lyrica 50 mg q8 hours - for GAD and fibromyalgia - Continue Flexeril 10 mg nightly - for fibromyalgia - Continue Propranolol 10 mg BID - for anxiety - Continue Prazosin 1 mg nightly - for PTSD   3. Medical Management - Continue Neosporin ointment BID - for healing acne lesion - Continue home Oral Iron every other day - Continue Breo Ellipta 1 puff daily  - Continue home Albuterol 2 puffs PRN    Other PRNs -Tylenol 650 mg q6hr PRN for mild pain -Mylanta 30 ml suspension for indigestion -Milk of Magnesia 30 ml for constipation -Hydroxyzine 25 mg tid PRN for anxiety   4. Discharge Planning Patient will require the following based on my assessment:  Greatly appreciate CSW and Case management assistance with facilitating these needs and any further recommendations regarding patient's needs upon discharge. Estimated LOS: 5-7 days Discharge Concerns:  Discharge Goals: Return home with outpatient referrals for mental health follow-up including medication management/psychotherapy    Kai Levins, NP

## 2021-10-21 NOTE — Progress Notes (Signed)
Adult Psychoeducational Group Note  Date:  10/21/2021 Time:  8:58 PM  Group Topic/Focus:  Wrap-Up Group:   The focus of this group is to help patients review their daily goal of treatment and discuss progress on daily workbooks.  Participation Level:  Active  Participation Quality:  Appropriate  Affect:  Appropriate  Cognitive:  Alert and Appropriate  Insight: Appropriate  Engagement in Group:  Engaged  Modes of Intervention:  Discussion  Additional Comments:  [name] said her day was a 7. Her goal for today stay positive and she achieved her goal. Coping skills breathing word search and quiet time.The one word describe her day peaceful.  [name] 10/21/2021, 8:58 PM

## 2021-10-21 NOTE — Group Note (Addendum)
LCSW Group Therapy Note  10/21/2021      Type of Therapy and Topic:  Group Therapy: Gratitude   Description:   Group could not be held by Education officer, museum, but Airline pilot did provide group.  A handout was given to each patient, with the following information:   Gratitude  "Acknowledging the good that you already have in your life is the foundation for all abundance." - Betha Loa  " 'Enough' is a feast." - Buddhist Proverb  "Gratitude sweetens even the smallest moments."  "It is not joy that makes [name] grateful; It is gratitude that makes [name] joyful." - Elroy Channel    Put at least one response under each category of something for which you are grateful:  People:  Experiences:  Things:  Places:  Skills:  Other:  Add more responses as you get ideas from other people.   Therapeutic Modalities:   Activity  [place] Hun Grossman-Orr, LCSW .

## 2021-10-21 NOTE — Progress Notes (Signed)
Pt c/o "feeling dizzy." Writer notified mental health provider who requested Orthostatic Vitals. Writer retrieved vitals, which were as follows:  Laying: 108/58 P76 Sitting: 100/60  P81 Standing: 95/56   P102 Standing for 3 mins: 108/61   P94  Writer notified mental health provider.

## 2021-10-21 NOTE — Progress Notes (Signed)
   10/20/21 2200  Psych Admission Type (Psych Patients Only)  Admission Status Voluntary  Psychosocial Assessment  Patient Complaints Anxiety;Depression  Eye Contact Avoids  Facial Expression Anxious  Affect Appropriate to circumstance  Speech Rapid  Interaction Minimal  Motor Activity Fidgety  Appearance/Hygiene Improved  Behavior Characteristics Anxious  Mood Pleasant  Thought Process  Coherency Flight of ideas  Content WDL  Delusions None reported or observed  Perception Hallucinations  Hallucination Auditory;Visual  Judgment Limited  Confusion None  Danger to Self  Current suicidal ideation? Denies

## 2021-10-21 NOTE — Progress Notes (Signed)
   10/21/21 2336  Psych Admission Type (Psych Patients Only)  Admission Status Voluntary  Psychosocial Assessment  Patient Complaints Irritability;Agitation;[place];Watchful  Facial Expression Animated  Affect Anxious;Preoccupied  Speech Logical/coherent  Interaction Minimal  Motor Activity Other (Comment) (WDL)  Appearance/Hygiene Disheveled  Behavior Characteristics Irritable  Mood Irritable;Labile;Suspicious;Preoccupied  Thought Process  Coherency WDL  Content WDL  Delusions None reported or observed  Perception WDL  Hallucination Auditory  Judgment Poor  Confusion None  Danger to Self  Current suicidal ideation? Denies  Self-Injurious Behavior No self-injurious ideation or behavior indicators observed or expressed   Agreement Not to Harm Self Yes  Description of Agreement verbal  Danger to Others  Danger to Others None reported or observed  Danger to Others Abnormal  Harmful Behavior to others No threats or harm toward other people  Destructive Behavior No threats or harm toward property

## 2021-10-21 NOTE — [facility] Group Notes (Signed)
.  Psychoeducational Group Note  Date: 10-21-21 Time: 0900-1000    Goal Setting   Purpose of Group: This group helps to provide patients with the steps of setting a goal that is specific, measurable, attainable, realistic and time specific. A discussion on how we keep ourselves stuck with negative self talk. Homework given for Patients to write 30 positive attributes about themselves.    Participation Level:  did not attend  [name]

## 2021-10-21 NOTE — [facility] Group Notes (Signed)
[facility] Group Notes:  (Nursing/MHT/Case Management/Adjunct)  Date:  10/21/2021  Time:  11:58 AM  Type of Therapy:  Group Therapy  Participation Level:  Active  Participation Quality:  Appropriate  Affect:  Appropriate  Cognitive:  Appropriate  Insight:  Appropriate  Engagement in Group:  Engaged  Modes of Intervention:  Discussion  Summary of Progress/Problems:  Patient participated in an orientation group. Patient's goal is to get ready to go home.   Elza Rafter 10/21/2021, 11:58 AM

## 2021-10-22 DIAGNOSIS — R45851 Suicidal ideations: Secondary | ICD-10-CM | POA: Diagnosis not present

## 2021-10-22 MED ORDER — PANTOPRAZOLE SODIUM 40 MG PO TBEC
40.0000 mg | DELAYED_RELEASE_TABLET | Freq: Every day | ORAL | Status: DC
  Administered 2021-10-22 – 2021-10-23 (×2): 40 mg via ORAL
  Filled 2021-10-22 (×4): qty 1

## 2021-10-22 NOTE — Group Note (Deleted)
LCSW Group Therapy Note  Group Date: 10/22/2021 Start Time: 1000 End Time: 1001   Type of Therapy and Topic:  Group Therapy: Positive Affirmations  Participation Level:  {[facility] PARTICIPATION JQZES:92330}   Description of Group:   This group addressed positive affirmation towards self and others.  Patients went around the room and identified two positive things about themselves and two positive things about a peer in the room.  Patients reflected on how it felt to share something positive with others, to identify positive things about themselves, and to hear positive things from others/ Patients were encouraged to have a daily reflection of positive characteristics or circumstances.   Therapeutic Goals: 1. Patients will verbalize two of their positive qualities 2. Patients will demonstrate empathy for others by stating two positive qualities about a peer in the group 3. Patients will verbalize their feelings when voicing positive self affirmations and when voicing positive affirmations of others 4. Patients will discuss the potential positive impact on their wellness/recovery of focusing on positive traits of self and others.  Summary of Patient Progress:  *** actively engaged in the discussion and . S*** was able***or not able to identify positive affirmations about ***self as well as other group members. Patient demonstrated *** insight into the subject matter, was respectful of peers, participated throughout the entire session.  Therapeutic Modalities:   Cognitive Behavioral Therapy Motivational Interviewing    Marquette Old 10/22/2021  8:49 AM

## 2021-10-22 NOTE — Progress Notes (Signed)
[facility] MD Progress Note  10/22/2021 1:57 PM [name]  MRN:  [id number]  HPI: [name] is a 31 y.o. female with psychiatric hx of schizoaffective disorder bipolar type, anxiety, autism spectrum disorder, PTSD, and borderline personality disorder who presented to [facility] voluntarily from the ED on 10/18/21 for suicidal ideations with plans to overdose on her medications and "bang my head against glass" due to hearing voices that tell her "kill yourself" and "you're worthless". Pt was admitted to this [facility] for treatment and stabilization of her mood.   24 hour chart review: V/S for the past 24 hrs have been WNL. Pt has been compliant with her scheduled medications and last required Hydroxyzine 25 mg today afternoon for anxiety. Ibuprofen '600mg'$  was given earlier today morning for anxiety & she is compliant with her scheduled Lexapro, Seroquel and other medications for the treatment of her presenting symptoms. Pt has attended some unit group sessions in the past 24 hrs, and has been appropriate and has contributed.   Today's patient assessment note: Today, pt is seen in her room on the 300 hall. She is calm and cooperative with assessment. She presents with a euthymic mood & affect is congruent. Attention to personal hygiene and grooming is fair, eye contact is good, speech is clear & coherent. Thought contents are organized and logical, and pt currently denies SI/HI/AVH or paranoia. There is no evidence of delusional thoughts.    Pt reports that her sleep quality last night was good, she reports a good appetite, denies being in any physical distress, and denies any medication related side effects.  She reports that she is tolerating all of her medications well, and verbalizes readiness for discharge.  Patient has been educated that the tentative discharge plan is for tomorrow pending safety planning by CSW.  She verbalizes agreement with plan.  We are continuing all patient's  medications as listed below with no changes. No TD/EPS type symptoms found on assessment, and pt denies any feelings of stiffness. AIMS: 0.   Principal Problem: Suicidal ideation Diagnosis: Principal Problem:   Suicidal ideation  Total Time spent with patient: 30 minutes  Past Psychiatric History: See H & P  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 fragile X syndrome    Chronic constipation    Depression    Drug-seeking behavior    Essential tremor    Headache    Overdose of acetaminophen 07/2017   and other meds   Personality disorder ([place])    Schizo-affective psychosis ([place])    Schizoaffective disorder, bipolar type ([place])    Seizures ([place])    Last seizure December 2017   Sleep apnea     Past Surgical History:  Procedure Laterality Date   MOUTH SURGERY  2009 or 2010   Family History:  Family History  Problem Relation Age of Onset   Mental illness Father    Asthma Father    PDD Brother    Seizures Brother    Family Psychiatric  History: As listed above Social History:  Social History   Substance and Sexual Activity  Alcohol Use No   Alcohol/week: 1.0 standard drink of alcohol   Types: 1 Standard drinks or equivalent per week   Comment: denies at this time     Social History   Substance and  Sexual Activity  Drug Use No   Comment: History of cocaine use at age 54 for 4 months    Social History   Socioeconomic History   Marital status: Widowed    Spouse name: Not on file   Number of children: 0   Years of education: Not on file   Highest education level: Not on file  Occupational History   Occupation: disability  Tobacco Use   Smoking status: Former    Packs/day: 0.00    Types: Cigarettes   Smokeless tobacco: Never   Tobacco comments:    Smoked for 2  years age [age]  Vaping Use   Vaping Use: Never used  Substance and Sexual  Activity   Alcohol use: No    Alcohol/week: 1.0 standard drink of alcohol    Types: 1 Standard drinks or equivalent per week    Comment: denies at this time   Drug use: No    Comment: History of cocaine use at age 32 for 4 months   Sexual activity: Not Currently    Birth control/protection: None  Other Topics Concern   Not on file  Social History Narrative   Marital status: Widowed      Children: daughter      Lives: with boyfriend, in two story home      Employment:  Disability      Tobacco: quit smoking; smoked for two years.      Alcohol ;none      Drugs: none   Has not traveled outside of the country.   Right handed         Social Determinants of Health   Financial Resource Strain: Not on file  Food Insecurity: No Food Insecurity (10/18/2021)   Hunger Vital Sign    Worried About Running Out of Food in the Last Year: Never true    Ran Out of Food in the Last Year: Never true  Transportation Needs: No Transportation Needs (10/18/2021)   PRAPARE - Hydrologist (Medical): No    Lack of Transportation (Non-Medical): No  Physical Activity: Not on file  Stress: Not on file  Social Connections: Not on file   Additional Social History:   Sleep: Good  Appetite:  Good  Current Medications: Current Facility-Administered Medications  Medication Dose Route Frequency Provider Last Rate Last Admin   albuterol (VENTOLIN HFA) 108 (90 Base) MCG/ACT inhaler 2 puff  2 puff Inhalation Q6H PRN Tharon Aquas, NP       alum & mag hydroxide-simeth (MAALOX/MYLANTA) 200-200-20 MG/5ML suspension 30 mL  30 mL Oral Q4H PRN Tharon Aquas, NP   30 mL at 10/20/21 1815   calcium carbonate (TUMS - dosed in mg elemental calcium) chewable tablet 1,500 mg  1,500 mg Oral BID PRN Tharon Aquas, NP       cariprazine (VRAYLAR) capsule 3 mg  3 mg Oral Daily Tharon Aquas, NP   3 mg at 10/22/21 3151   cyclobenzaprine (FLEXERIL) tablet 10 mg  10 mg Oral QHS  Tharon Aquas, NP   10 mg at 10/21/21 2103   diclofenac Sodium (VOLTAREN) 1 % topical gel 2 g  2 g Topical QID Massengill, Ovid Curd, MD   2 g at 10/22/21 0750   docusate sodium (COLACE) capsule 100 mg  100 mg Oral Michaelle Copas, MD   100 mg at 10/22/21 0758   escitalopram (LEXAPRO) tablet 15 mg  15 mg Oral Daily Tharon Aquas, NP  15 mg at 10/22/21 0751   ferrous sulfate tablet 325 mg  325 mg Oral Geanie Kenning, NP   325 mg at 10/22/21 0752   fluticasone furoate-vilanterol (BREO ELLIPTA) 200-25 MCG/ACT 1 puff  1 puff Inhalation Daily Tharon Aquas, NP   1 puff at 10/22/21 0749   hydrOXYzine (ATARAX) tablet 25 mg  25 mg Oral TID PRN Tharon Aquas, NP   25 mg at 10/22/21 1211   ibuprofen (ADVIL) tablet 600 mg  600 mg Oral Daily PRN Tharon Aquas, NP   600 mg at 10/22/21 0755   magnesium hydroxide (MILK OF MAGNESIA) suspension 30 mL  30 mL Oral Daily PRN Tharon Aquas, NP       neomycin-bacitracin-polymyxin (NEOSPORIN) ointment   Topical BID Janine Limbo, MD   1 Application at 62/70/35 0750   ondansetron (ZOFRAN-ODT) disintegrating tablet 4 mg  4 mg Oral Q8H PRN [place], Ene A, NP   4 mg at 10/21/21 2102   prazosin (MINIPRESS) capsule 1 mg  1 mg Oral QHS Tharon Aquas, NP   1 mg at 10/21/21 2129   pregabalin (LYRICA) capsule 50 mg  50 mg Oral Q8H Massengill, Ovid Curd, MD   50 mg at 10/22/21 1316   propranolol (INDERAL) tablet 10 mg  10 mg Oral BID Tharon Aquas, NP   10 mg at 10/22/21 0751   QUEtiapine (SEROQUEL) tablet 12.5 mg  12.5 mg Oral q morning Tharon Aquas, NP   12.5 mg at 10/22/21 0753   QUEtiapine (SEROQUEL) tablet 12.5 mg  12.5 mg Oral BID PRN Janine Limbo, MD   12.5 mg at 10/21/21 2247   QUEtiapine (SEROQUEL) tablet 25 mg  25 mg Oral QHS Tharon Aquas, NP   25 mg at 10/21/21 2103    Lab Results: No results found for this or any previous visit (from the past 48 hour(s)).  Blood Alcohol level:   Lab Results  Component Value Date   ETH <10 10/11/2021   ETH <10 00/93/8182    Metabolic Disorder Labs: Lab Results  Component Value Date   HGBA1C 5.0 10/07/2021   MPG 96.8 10/07/2021   MPG 96.8 09/02/2021   Lab Results  Component Value Date   PROLACTIN 66.2 (H) 03/02/2021   PROLACTIN 66.1 (H) 02/04/2020   Lab Results  Component Value Date   CHOL 148 10/07/2021   TRIG 173 (H) 10/07/2021   HDL 39 (L) 10/07/2021   CHOLHDL 3.8 10/07/2021   VLDL 35 10/07/2021   LDLCALC 74 10/07/2021   LDLCALC 165 (H) 05/01/2021   Physical Findings: AIMS: 0 CIWA:  n/a  COWS:   n/a  Musculoskeletal: Strength & Muscle Tone: within normal limits Gait & Station: normal Patient leans: N/A  Psychiatric Specialty Exam:  Presentation  General Appearance: Appropriate for Environment; Fairly Groomed  Eye Contact:Good  Speech:Clear and Coherent  Speech Volume:Normal  Handedness:Right  Mood and Affect  Mood:Euthymic  Affect:Appropriate; Congruent  Thought Process  Thought Processes:Coherent  Descriptions of Associations:Intact  Orientation:Full (Time, Place and Person)  Thought Content:Logical  History of Schizophrenia/Schizoaffective disorder:Yes  Duration of Psychotic Symptoms:Greater than six months  Hallucinations:Hallucinations: None Description of Auditory Hallucinations: States AH is better with her medication.  Ideas of Reference:None  Suicidal Thoughts:Suicidal Thoughts: No SI Active Intent and/or Plan: Without Plan  Homicidal Thoughts:Homicidal Thoughts: No  Sensorium  Memory:Immediate Good  Judgment:Good  Insight:Good   Executive Functions  Concentration:Good  Attention Span:Good  [place]  Psychomotor Activity  Psychomotor Activity:Psychomotor Activity: Normal  Assets  Assets:Communication Skills  Sleep  Sleep:Sleep: Fair Sleep hours not documented, but pt states that she slept well last  night  Physical Exam: Physical Exam Constitutional:      Appearance: Normal appearance.  HENT:     Head: Normocephalic.     Nose: Nose normal. No congestion or rhinorrhea.  Eyes:     Pupils: Pupils are equal, round, and reactive to light.  Pulmonary:     Effort: Pulmonary effort is normal.  Musculoskeletal:        General: Normal range of motion.     Cervical back: Normal range of motion.  Neurological:     Mental Status: She is alert and oriented to person, place, and time.  Psychiatric:        Thought Content: Thought content normal.    Review of Systems  Constitutional: Negative.   HENT: Negative.    Eyes: Negative.   Respiratory: Negative.    Cardiovascular: Negative.   Gastrointestinal: Negative.   Genitourinary: Negative.   Musculoskeletal: Negative.   Skin: Negative.   Neurological: Negative.   Psychiatric/Behavioral:  Positive for depression (significantly reduced and resolving on current meds). Negative for hallucinations, memory loss, substance abuse and suicidal ideas. The patient has insomnia (resolving). The patient is not nervous/anxious.    Blood pressure 110/68, pulse 85, temperature 98.2 F (36.8 C), temperature source Oral, resp. rate 20, height '5\' 5"'$  (1.651 m), weight 92.1 kg, SpO2 98 %. Body mass index is 33.78 kg/m.   Treatment Plan Summary: Daily contact with patient to assess and evaluate symptoms and progress in treatment and Medication management.    Physician Treatment Plan for Primary Diagnosis: Principal Problem:    Schizoaffective disorder, bipolar type Active Problems:    Borderline personality disorder    PTSD (post-traumatic stress disorder)    GAD (generalized anxiety disorder)    Suicidal ideation    Fibromyalgia     Autism spectrum disorder   Safety and Monitoring: Voluntary admission to inpatient psychiatric unit for safety, stabilization and treatment Daily contact with patient to assess and evaluate symptoms and progress in  treatment Appropriate medication management to further stabilize patient Patient's case will be regularly discussed in multi-disciplinary team meeting Observation Level : q15 minute checks Vital signs: q12 hours Precautions: Safety   2. Psychiatric Problems - Continue home Vraylar 3 mg daily - for schizoaffective disorder, bipolar type  - Previously discontinued additional dose of Seroquel 12.5 mg daily in the afternoon (2pm) - for schizoaffective disorder, bipolar type - Continue seroquel 12.5 mg bid prn for anxiety & psychosis - Continue home Seroquel 12.5 mg every morning (8am) and 25 mg nightly  - Continue home Lexapro 15 mg daily - for GAD - Continue Lyrica 50 mg q8 hours - for GAD and fibromyalgia - Continue Flexeril 10 mg nightly - for fibromyalgia - Continue Propranolol 10 mg BID - for anxiety - Continue Prazosin 1 mg nightly - for PTSD   3. Medical Management - Continue Neosporin ointment BID - for healing acne lesion - Continue home Oral Iron every other day - Continue Breo Ellipta 1 puff daily  - Continue home Albuterol 2 puffs PRN    Other PRNs -Tylenol 650 mg q6hr PRN for mild pain -Mylanta 30 ml suspension for indigestion -Milk of Magnesia 30 ml for constipation -Hydroxyzine 25 mg tid PRN for anxiety   4. Discharge Planning Patient will require the following based on my assessment:  Greatly appreciate  CSW and Case management assistance with facilitating these needs and any further recommendations regarding patient's needs upon discharge. Estimated LOS: 5-7 days Discharge Concerns:  Discharge Goals: Return home with outpatient referrals for mental health follow-up including medication management/psychotherapy  Nicholes Rough, NP 10/22/2021, 1:57 PM

## 2021-10-22 NOTE — Group Note (Signed)
LCSW Group Therapy Note  10/22/2021      Type of Therapy and Topic:  Group Therapy: Positive Affirmations   Description:   Group could not be held by Education officer, museum, but Airline pilot did provide group.    CSW provided a handout for each patient, with the following information:   Positive Affirmation Worksheet  Programming your subconscious by repeating positive statements with focus, intention  and belief is a technique called positive affirmations. This Worksheet will walk you  through the process of creating your own positive affirmations.   Releasing Negative Feelings  It is believed that this process is more effective when incorporating and understanding the negative  feelings, or mental programs that you harbor within your subconscious regarding yourself. This first part  will help you identify your own negative beliefs. When you shine your conscious light on your negative  beliefs and understand that they are merely beliefs and not based on reality, you can then utilize your  positive affirmations to overcome such beliefs and focus the rudder of your own life. Write as many negative beliefs down as you feel apply to your feelings about yourself.  Use the following prompts as a guide: I never. Nobody else. I'm the only one that. I am not. I don't. I don't want. I hate. I can't. Identifying Wants  Now, considering each of the negative feelings that you wrote down about yourself, write  a list of what you really want or deserve being very specific.   Creating Affirmations  From the previous exercise, distill your wants to a list of your desires. Write down a list of  your positive affirmations. Use the following prompts as a guide: I am. I welcome. I deserve. I choose. I believe. I trust. I have. I know. I feel. I create. I LOVE. One of the most powerful affirmations starts with I am.  I am.  Vocalize Affirmations Daily  Now that you have your list of  affirmations repeat them out loud to yourself several times each  day. When you say them, focus on their meanings and visualize your life as though your  affirmations have already become reality.    Therapeutic Modalities:   Activity  [place] Hun Grossman-Orr, LCSW .

## 2021-10-22 NOTE — Progress Notes (Addendum)
   10/22/21 1100  Psych Admission Type (Psych Patients Only)  Admission Status Voluntary  Psychosocial Assessment  Patient Complaints Anxiety  Eye Contact Brief  Facial Expression Anxious  Affect Anxious;Preoccupied  Speech Pressured;Logical/coherent  Interaction Isolative  Motor Activity Fidgety  Appearance/Hygiene Disheveled  Behavior Characteristics Cooperative;Anxious  Mood Anxious  Thought Process  Coherency WDL  Content WDL  Delusions None reported or observed  Perception WDL  Hallucination None reported or observed  Judgment Poor  Confusion None  Danger to Self  Current suicidal ideation? Denies  Self-Injurious Behavior No self-injurious ideation or behavior indicators observed or expressed   Agreement Not to Harm Self Yes  Description of Agreement verbal  Danger to Others  Danger to Others None reported or observed  Danger to Others Abnormal  Harmful Behavior to others No threats or harm toward other people  Destructive Behavior No threats or harm toward property

## 2021-10-22 NOTE — Progress Notes (Signed)
D. Pt has been up x2 tonight with complaint of paranoia.  Pt states that she was afraid her clothes, which were being washed, were going to be stolen.  Pt states also "what if someone comes in  here and kills me tonight?".  Pt states I need to be discharged tomorrow, I don't feel safe.    A. NP informed, medication given as ordered to Pt.  Pt offered quiet room where she would be observed on camera throughout the night.  Pt declined this and stated no "as long as I get my clothes I'll be fine, I stay up like this sometimes".  Pt requested and received Gatorade, asked Pt to come to staff if she continued to feel unsafe.    R.  Pt presently in room, resting in bed, respirations even and unlabored.  No distress noted.

## 2021-10-22 NOTE — Progress Notes (Signed)
D) Pt received calm, visible, participating in milieu, and in no acute distress. Pt A & O x4. Pt denies SI, HI, A/ V H, depression, anxiety and pain at this time. A) Pt encouraged to drink fluids. Pt encouraged to come to staff with needs. Pt encouraged to attend and participate in groups. Pt encouraged to set reachable goals.  R) Pt remained safe on unit, in no acute distress, will continue to assess.      10/22/21 2100  Psych Admission Type (Psych Patients Only)  Admission Status Voluntary  Psychosocial Assessment  Patient Complaints Irritability  Eye Contact Fair  Facial Expression Anxious  Affect Preoccupied  Speech Pressured;Logical/coherent  Interaction Minimal  Motor Activity Fidgety  Appearance/Hygiene Disheveled  Behavior Characteristics Calm;Anxious  Mood Anxious  Thought Process  Coherency WDL  Content WDL  Delusions None reported or observed  Perception WDL  Hallucination None reported or observed  Judgment Poor  Confusion None  Danger to Self  Current suicidal ideation? Denies  Self-Injurious Behavior No self-injurious ideation or behavior indicators observed or expressed   Agreement Not to Harm Self Yes  Description of Agreement verbal  Danger to Others  Danger to Others None reported or observed  Danger to Others Abnormal  Harmful Behavior to others No threats or harm toward other people  Destructive Behavior No threats or harm toward property

## 2021-10-22 NOTE — [facility] Group Notes (Signed)
Adult Psychoeducational Group Note Date:  10/22/2021 Time:  0900-1045 Group Topic/Focus: PROGRESSIVE RELAXATION. A group where deep breathing is taught and tensing and relaxation muscle groups is used. Imagery is used as well.  Pts are asked to imagine 3 pillars that hold them up when they are not able to hold themselves up and to share that with the group.  Participation Level:  did not attend  [name]

## 2021-10-22 NOTE — [facility] Group Notes (Signed)
Adult Psychoeducational Group  Date:  10/22/2021 Time:  1300-1400  Group Topic/Focus: Continuation of the group from Saturday. Looking at the lists that were created and talking about what needs to be done with the homework of 30 positives about themselves.                                     Talking about taking their power back and helping themselves to develop a positive self esteem.      Participation Quality:  did not attend  [name]

## 2021-10-22 NOTE — Progress Notes (Signed)
Adult Psychoeducational Group Note  Date:  10/22/2021 Time:  9:17 PM  Group Topic/Focus:  Wrap-Up Group:   The focus of this group is to help patients review their daily goal of treatment and discuss progress on daily workbooks.  Participation Level:  Active  Participation Quality:  Appropriate  Affect:  Flat  Cognitive:  Appropriate  Insight: Lacking and Limited  Engagement in Group:  Engaged  Modes of Intervention:  Discussion  Additional Comments:   Pt states that she has an "alright" day and communicated that she felt her autism was affecting her ability to socialize. Pt communicated her goal was to begin to ask for help during moments of crisis.  Gerhard Perches 10/22/2021, 9:17 PM

## 2021-10-23 DIAGNOSIS — R45851 Suicidal ideations: Secondary | ICD-10-CM | POA: Diagnosis not present

## 2021-10-23 MED ORDER — QUETIAPINE FUMARATE 25 MG PO TABS: 25.0000 mg | ORAL_TABLET | Freq: Every day | ORAL | 0 refills | Status: DC

## 2021-10-23 MED ORDER — BACITRACIN-NEOMYCIN-POLYMYXIN OINTMENT TUBE: 1.0000 | TOPICAL_OINTMENT | Freq: Two times a day (BID) | CUTANEOUS | Status: DC

## 2021-10-23 MED ORDER — PREGABALIN 50 MG PO CAPS: 50.0000 mg | ORAL_CAPSULE | Freq: Three times a day (TID) | ORAL | 0 refills | Status: DC

## 2021-10-23 MED ORDER — QUETIAPINE FUMARATE 25 MG PO TABS: 12.5000 mg | ORAL_TABLET | Freq: Every morning | ORAL | 0 refills | Status: DC

## 2021-10-23 MED ORDER — QUETIAPINE FUMARATE 25 MG PO TABS: 12.5000 mg | ORAL_TABLET | Freq: Two times a day (BID) | ORAL | 0 refills | Status: DC | PRN

## 2021-10-23 NOTE — [facility] Group Notes (Signed)
Spiritual care group on grief and loss facilitated by chaplain Janne Napoleon, [facility]   Group Goal:   Support / Education around grief and loss   Members engage in facilitated group support and psycho-social education.   Group Description:   Following introductions and group rules, group members engaged in facilitated group dialog and support around topic of loss, with particular support around experiences of loss in their lives. Group Identified types of loss (relationships / self / things) and identified patterns, circumstances, and changes that precipitate losses. Reflected on thoughts / feelings around loss, normalized grief responses, and recognized variety in grief experience. Group noted Worden's four tasks of grief in discussion.   Group drew on Adlerian / Rogerian, narrative, MI,   Patient Progress: Trenda was present for the first part of group before she discharged.  She engaged and participated in the conversation and shared about the loss of custody of her daughter and loss of relationship with her mother.    [address], [place], [phone]

## 2021-10-23 NOTE — [facility] Suicide Risk Assessment (Signed)
[facility] Discharge Suicide Risk Assessment   Principal Problem: Schizoaffective disorder, bipolar type [facility]) Discharge Diagnoses: Principal Problem:   Schizoaffective disorder, bipolar type ([place]) Active Problems:   Borderline personality disorder ([place])   PTSD (post-traumatic stress disorder)   GAD (generalized anxiety disorder)   Total Time spent with patient: 15 minutes  [name] is a 31 y.o. female with psychiatric hx of schizoaffective disorder bipolar type, anxiety, autism spectrum disorder, PTSD, and borderline personality disorder who presented to [facility] voluntarily from the ED on 10/18/21 for suicidal ideations with plans to overdose on her medications and "bang my head against glass" due to hearing voices that tell her "kill yourself" and "you're worthless". Pt was admitted to this [facility] for treatment and stabilization of her mood.     During the patient's hospitalization, patient had extensive initial psychiatric evaluation, and follow-up psychiatric evaluations every day.   Psychiatric diagnoses provided upon initial assessment:  Schizoaffective disorder, bipolar type    Borderline personality disorder    PTSD (post-traumatic stress disorder)    GAD (generalized anxiety disorder)    Suicidal ideation    Fibromyalgia     Autism spectrum disorder   Patient's psychiatric medications were adjusted on admission:  - Continue home Vraylar 3 mg daily - for schizoaffective disorder, bipolar type - Start additional dose of Seroquel 12.5 mg daily in the afternoon (2pm) - for schizoaffective disorder, bipolar type - Continue home Seroquel 12.5 mg every morning (8am) and 25 mg nightly - Continue home Lexapro 15 mg daily - for GAD - Change Gabapentin 300 mg TID to Lyrica 50 mg q8 hours - for GAD and fibromyalgia - Continue Flexeril 10 mg nightly - for fibromyalgia - Continue Propranolol 10 mg BID - for anxiety - Continue Prazosin 1 mg nightly - for PTSD     During the  hospitalization, other adjustments were made to the patient's psychiatric medication regimen:  -seroquel was changed to 12.5 mg qam, 25 mg qhs, and 12.5 mg bid prn   Patient's care was discussed during the interdisciplinary team meeting every day during the hospitalization.   The patient denied having side effects to prescribed psychiatric medication.   Gradually, patient started adjusting to milieu. The patient was evaluated each day by a clinical provider to ascertain response to treatment. Improvement was noted by the patient's report of decreasing symptoms, improved sleep and appetite, affect, medication tolerance, behavior, and participation in unit programming.  Patient was asked each day to complete a self inventory noting mood, mental status, pain, new symptoms, anxiety and concerns.     Symptoms were reported as significantly decreased or resolved completely by discharge.    On day of discharge, the patient reports that their mood is stable. The patient denied having suicidal thoughts for more than 48 hours prior to discharge.  Patient denies having homicidal thoughts.  Patient denies having auditory hallucinations.  Patient denies any visual hallucinations or other symptoms of psychosis. The patient was motivated to continue taking medication with a goal of continued improvement in mental health.    The patient reports their target psychiatric symptoms of depression, auditory hallucinations, and suicidal thoughts, all responded well to the psychiatric medications, and the patient reports overall benefit other psychiatric hospitalization. Supportive psychotherapy was provided to the patient. The patient also participated in regular group therapy while hospitalized. Coping skills, problem solving as well as relaxation therapies were also part of the unit programming.   Labs were reviewed with the patient, and abnormal results  were discussed with the patient.   The patient is able to  verbalize their individual safety plan to this provider.   # It is recommended to the patient to continue psychiatric medications as prescribed, after discharge from the hospital.     # It is recommended to the patient to follow up with your outpatient psychiatric provider and PCP.   # It was discussed with the patient, the impact of alcohol, drugs, tobacco have been there overall psychiatric and medical wellbeing, and total abstinence from substance use was recommended the patient.ed.   # Prescriptions provided or sent directly to preferred pharmacy at discharge. Patient agreeable to plan. Given opportunity to ask questions. Appears to feel comfortable with discharge.    # In the event of worsening symptoms, the patient is instructed to call the crisis hotline, 911 and or go to the nearest ED for appropriate evaluation and treatment of symptoms. To follow-up with primary care provider for other medical issues, concerns and or health care needs   # Patient was discharged home with CST team, with a plan to follow up  noted below.       Psychiatric Specialty Exam  Presentation  General Appearance: Appropriate for Environment; Casual; Fairly Groomed  Eye Contact:Fair; Good  Speech:Clear and Coherent; Normal Rate  Speech Volume:Normal  Handedness:Right   Mood and Affect  Mood:Euthymic  Duration of Depression Symptoms: Greater than two weeks  Affect:Appropriate; Congruent; Full Range   Thought Process  Thought Processes:Linear  Descriptions of Associations:Intact  Orientation:Full (Time, Place and Person)  Thought Content:Logical  History of Schizophrenia/Schizoaffective disorder:Yes  Duration of Psychotic Symptoms:Greater than six months  Hallucinations:Hallucinations: None  Ideas of Reference:None  Suicidal Thoughts:Suicidal Thoughts: No  Homicidal Thoughts:Homicidal Thoughts: No   Sensorium  Memory:Immediate  Good  Judgment:Fair  Insight:Fair   Executive Functions  Concentration:Fair  Attention Span:Fair  [place] of Knowledge:Good  Language:Good   Psychomotor Activity  Psychomotor Activity:Psychomotor Activity: Normal (no eps on day of dc. aims score zero on day of dc.)   Assets  Assets:Communication Skills   Sleep  Sleep:Sleep: Fair   Physical Exam: Physical Exam See discharge summary  ROS See discharge summary  Blood pressure 112/71, pulse (!) 115, temperature 98.7 F (37.1 C), temperature source Oral, resp. rate 20, height '5\' 5"'$  (1.651 m), weight 92.1 kg, SpO2 98 %. Body mass index is 33.78 kg/m.  Mental Status Per Nursing Assessment::   On Admission:  Suicidal ideation indicated by patient, Self-harm thoughts  Demographic factors:  Caucasian Loss Factors:  Loss of significant relationship, Legal issues, Financial problems / change in socioeconomic status Historical Factors:  Family history of mental illness or substance abuse, Prior suicide attempts Risk Reduction Factors:  Employed, Living with another person, especially a relative  Continued Clinical Symptoms:  Schizoaffective d/o BP type - mood is stable. Denying psychotic symptoms. Denies SI and HI.   Cognitive Features That Contribute To Risk:  None    Suicide Risk:  Mild:  There are no identifiable suicide plans, no associated intent, mild dysphoria and related symptoms, good self-control (both objective and subjective assessment), few other risk factors, and identifiable protective factors, including available and accessible social support.    Follow-up Information     [facility] Follow up.   Why: Please call your CST Team and inform them of your discharge from the hospital. You can also reach Butch Penny, therapist, at [phone] Contact information: [place] [id number] [phone]  Center, [place] on 11/06/2021.   Why: Please follow up with  this provider for medication management services on 11/06/21 at 8:00 am. This appointment will be held in person. Contact information: [place] [address] [phone]         [facility] Follow up on 11/02/2021.   Why: with Chancy Milroy, MD Thursday Nov 02, 2021 10:35 AM Center for [facility] [place] [address] [phone] Contact information: [place] recommendations:   Activity: as tolerated   Diet: heart healthy   Other: -Follow-up with your outpatient psychiatric provider -instructions on appointment date, time, and address (location) are provided to you in discharge paperwork.   -Take your psychiatric medications as prescribed at discharge - instructions are provided to you in the discharge paperwork   -Follow-up with outpatient primary care doctor and other specialists -for management of preventative medicine and chronic medical disease.   -Testing: Follow-up with outpatient provider for abnormal lab results: anemia   -Recommend abstinence from alcohol, tobacco, and other illicit drug use at discharge.    -If your psychiatric symptoms recur, worsen, or if you have side effects to your psychiatric medications, call your outpatient psychiatric provider, 911, [phone] or go to the nearest emergency department.   -If suicidal thoughts recur, call your outpatient psychiatric provider, 911, [phone] or go to the nearest emergency department.    Christoper Allegra, MD 10/23/2021, 9:42 AM

## 2021-10-23 NOTE — Discharge Summary (Signed)
Physician Discharge Summary Note  Patient:  [name] is an 31 y.o., female MRN:  [id number] DOB:  Apr 05, 1990 Patient phone:  [phone] (home)  Patient address:   [place] [address],  Total Time spent with patient: 15 minutes  Date of Admission:  10/18/2021 Date of Discharge: 10-23-2021  Reason for Admission:    [name] is a 31 y.o. female with psychiatric hx of schizoaffective disorder bipolar type, anxiety, autism spectrum disorder, PTSD, and borderline personality disorder who presented to [facility] voluntarily from the ED on 10/18/21 for suicidal ideations with plans to overdose on her medications and "bang my head against glass" due to hearing voices that tell her "kill yourself" and "you're worthless". Pt was admitted to this [facility] for treatment and stabilization of her mood.  Principal Problem: Schizoaffective disorder, bipolar type [facility]) Discharge Diagnoses: Principal Problem:   Schizoaffective disorder, bipolar type ([place]) Active Problems:   Borderline personality disorder (HCC)   PTSD (post-traumatic stress disorder)   GAD (generalized anxiety disorder)   Past Psychiatric History:   Previous Psych Diagnoses: Schizoaffective disorder bipolar type, Anxiety, Autism spectrum disorder, PTSD, Borderline personality disorder  Prior inpatient treatment: Multiple, over 20 inpatient admissions over the course of her lifetime, 6 inpatient admissions in the past 6 months Outpatient treatment: has ACT team  Prior rehab hx: Denies History of suicide: multiple attempts, most recently a prescription overdose 2 weeks ago History of homicide: Denies Psychiatric medication history: Previously trialed Tegretol, Haldol, Clozaril, Depakote, Invega, and Lamictal  Psychiatric medication compliance history: Poor  Past Medical History:  Past Medical History:  Diagnosis Date   Acid reflux    Anxiety    Asthma    last attack 03/13/15 or  03/14/15   Autism    Carrier of fragile X syndrome    Chronic constipation    Depression    Drug-seeking behavior    Essential tremor    Headache    Overdose of acetaminophen 07/2017   and other meds   Personality disorder ([place])    Schizo-affective psychosis ([place])    Schizoaffective disorder, bipolar type ([place])    Seizures ([place])    Last seizure December 2017   Sleep apnea     Past Surgical History:  Procedure Laterality Date   MOUTH SURGERY  2009 or 2010   Family History:  Family History  Problem Relation Age of Onset   Mental illness Father    Asthma Father    PDD Brother    Seizures Brother    Family Psychiatric  History:  Medical: Mom-skin cancer, maternal grandpa-type 2 diabetes and heart disease, maternal grandma-thyroid and heart problems, dad-COPD, brother-asthma Psych: Dad-depression, brother, bipolar 1 disorder Psych Rx: Unsure SA/HA: 2 paternal great uncles completed suicide as well as ex-husband Substance use family hx:  paternal uncle-alcohol use disorder    Social History:  Social History   Substance and Sexual Activity  Alcohol Use No   Alcohol/week: 1.0 standard drink of alcohol   Types: 1 Standard drinks or equivalent per week   Comment: denies at this time     Social History   Substance and Sexual Activity  Drug Use No   Comment: History of cocaine use at age 41 for 4 months    Social History   Socioeconomic History   Marital status: Widowed    Spouse name: Not on file   Number of children: 0   Years of education: Not on file   Highest  education level: Not on file  Occupational History   Occupation: disability  Tobacco Use   Smoking status: Former    Packs/day: 0.00    Types: Cigarettes   Smokeless tobacco: Never   Tobacco comments:    Smoked for 2  years age [age]  Vaping Use   Vaping Use: Never used  Substance and Sexual Activity   Alcohol use: No    Alcohol/week: 1.0 standard drink of alcohol    Types: 1 Standard drinks or  equivalent per week    Comment: denies at this time   Drug use: No    Comment: History of cocaine use at age 70 for 4 months   Sexual activity: Not Currently    Birth control/protection: None  Other Topics Concern   Not on file  Social History Narrative   Marital status: Widowed      Children: daughter      Lives: with boyfriend, in two story home      Employment:  Disability      Tobacco: quit smoking; smoked for two years.      Alcohol ;none      Drugs: none   Has not traveled outside of the country.   Right handed         Social Determinants of Health   Financial Resource Strain: Not on file  Food Insecurity: No Food Insecurity (10/18/2021)   Hunger Vital Sign    Worried About Running Out of Food in the Last Year: Never true    Ran Out of Food in the Last Year: Never true  Transportation Needs: No Transportation Needs (10/18/2021)   PRAPARE - Hydrologist (Medical): No    Lack of Transportation (Non-Medical): No  Physical Activity: Not on file  Stress: Not on file  Social Connections: Not on file    Hospital Course:    HOSPITAL COURSE:  During the patient's hospitalization, patient had extensive initial psychiatric evaluation, and follow-up psychiatric evaluations every day.  Psychiatric diagnoses provided upon initial assessment:  Schizoaffective disorder, bipolar type    Borderline personality disorder    PTSD (post-traumatic stress disorder)    GAD (generalized anxiety disorder)    Suicidal ideation    Fibromyalgia     Autism spectrum disorder  Patient's psychiatric medications were adjusted on admission:  - Continue home Vraylar 3 mg daily - for schizoaffective disorder, bipolar type - Start additional dose of Seroquel 12.5 mg daily in the afternoon (2pm) - for schizoaffective disorder, bipolar type - Continue home Seroquel 12.5 mg every morning (8am) and 25 mg nightly - Continue home Lexapro 15 mg daily - for GAD - Change  Gabapentin 300 mg TID to Lyrica 50 mg q8 hours - for GAD and fibromyalgia - Continue Flexeril 10 mg nightly - for fibromyalgia - Continue Propranolol 10 mg BID - for anxiety - Continue Prazosin 1 mg nightly - for PTSD   During the hospitalization, other adjustments were made to the patient's psychiatric medication regimen:  -seroquel was changed to 12.5 mg qam, 25 mg qhs, and 12.5 mg bid prn  Patient's care was discussed during the interdisciplinary team meeting every day during the hospitalization.  The patient denied having side effects to prescribed psychiatric medication.  Gradually, patient started adjusting to milieu. The patient was evaluated each day by a clinical provider to ascertain response to treatment. Improvement was noted by the patient's report of decreasing symptoms, improved sleep and appetite, affect, medication tolerance, behavior, and participation  in unit programming.  Patient was asked each day to complete a self inventory noting mood, mental status, pain, new symptoms, anxiety and concerns.    Symptoms were reported as significantly decreased or resolved completely by discharge.   On day of discharge, the patient reports that their mood is stable. The patient denied having suicidal thoughts for more than 48 hours prior to discharge.  Patient denies having homicidal thoughts.  Patient denies having auditory hallucinations.  Patient denies any visual hallucinations or other symptoms of psychosis. The patient was motivated to continue taking medication with a goal of continued improvement in mental health.   The patient reports their target psychiatric symptoms of depression, auditory hallucinations, and suicidal thoughts, all responded well to the psychiatric medications, and the patient reports overall benefit other psychiatric hospitalization. Supportive psychotherapy was provided to the patient. The patient also participated in regular group therapy while hospitalized.  Coping skills, problem solving as well as relaxation therapies were also part of the unit programming.  Labs were reviewed with the patient, and abnormal results were discussed with the patient.  The patient is able to verbalize their individual safety plan to this provider.  # It is recommended to the patient to continue psychiatric medications as prescribed, after discharge from the hospital.    # It is recommended to the patient to follow up with your outpatient psychiatric provider and PCP.  # It was discussed with the patient, the impact of alcohol, drugs, tobacco have been there overall psychiatric and medical wellbeing, and total abstinence from substance use was recommended the patient.ed.  # Prescriptions provided or sent directly to preferred pharmacy at discharge. Patient agreeable to plan. Given opportunity to ask questions. Appears to feel comfortable with discharge.    # In the event of worsening symptoms, the patient is instructed to call the crisis hotline, 911 and or go to the nearest ED for appropriate evaluation and treatment of symptoms. To follow-up with primary care provider for other medical issues, concerns and or health care needs  # Patient was discharged home with CST team, with a plan to follow up  noted below.   Physical Findings: AIMS: Facial and Oral Movements Muscles of Facial Expression: None, normal Lips and Perioral Area: None, normal Jaw: None, normal Tongue: None, normal,Extremity Movements Upper (arms, wrists, hands, fingers): None, normal Lower (legs, knees, ankles, toes): None, normal, Trunk Movements Neck, shoulders, hips: None, normal, Overall Severity Severity of abnormal movements (highest score from questions above): None, normal Incapacitation due to abnormal movements: None, normal Patient's awareness of abnormal movements (rate only patient's report): No Awareness, Dental Status Current problems with teeth and/or dentures?: No Does  patient usually wear dentures?: No  CIWA:    COWS:     Musculoskeletal: Strength & Muscle Tone: within normal limits Gait & Station: normal Patient leans: N/A   Psychiatric Specialty Exam:  Presentation  General Appearance: Appropriate for Environment; Casual; Fairly Groomed  Eye Contact:Fair; Good  Speech:Clear and Coherent; Normal Rate  Speech Volume:Normal  Handedness:Right   Mood and Affect  Mood:Euthymic  Affect:Appropriate; Congruent; Full Range   Thought Process  Thought Processes:Linear  Descriptions of Associations:Intact  Orientation:Full (Time, Place and Person)  Thought Content:Logical  History of Schizophrenia/Schizoaffective disorder:Yes  Duration of Psychotic Symptoms:Greater than six months  Hallucinations:Hallucinations: None  Ideas of Reference:None  Suicidal Thoughts:Suicidal Thoughts: No  Homicidal Thoughts:Homicidal Thoughts: No   Sensorium  Memory:Immediate Good  Judgment:Fair  Insight:Fair   Executive Functions  Concentration:Fair  Attention Span:Fair  [place]  Language:Good   Psychomotor Activity  Psychomotor Activity:Psychomotor Activity: Normal (no eps on day of dc. aims score zero on day of dc.)   Assets  Assets:Communication Skills   Sleep  Sleep:Sleep: Fair    Physical Exam: Physical Exam Vitals reviewed.  Pulmonary:     Effort: Pulmonary effort is normal.  Neurological:     Motor: No weakness.     Gait: Gait normal.  Psychiatric:        Mood and Affect: Mood normal.        Behavior: Behavior normal.        Thought Content: Thought content normal.        Judgment: Judgment normal.    Review of Systems  Constitutional:  Negative for chills and fever.  Cardiovascular:  Negative for chest pain and palpitations.  Neurological:  Negative for dizziness, tingling, tremors and headaches.  Psychiatric/Behavioral:  Negative for depression, hallucinations, memory loss,  substance abuse and suicidal ideas. The patient is not nervous/anxious and does not have insomnia.   All other systems reviewed and are negative.  Blood pressure 112/71, pulse (!) 115, temperature 98.7 F (37.1 C), temperature source Oral, resp. rate 20, height '5\' 5"'$  (1.651 m), weight 92.1 kg, SpO2 98 %. Body mass index is 33.78 kg/m.   Social History   Tobacco Use  Smoking Status Former   Packs/day: 0.00   Types: Cigarettes  Smokeless Tobacco Never  Tobacco Comments   Smoked for 2  years age [age]   Tobacco Cessation:  N/A, patient does not currently use tobacco products   Blood Alcohol level:  Lab Results  Component Value Date   ETH <10 10/11/2021   ETH <10 78/29/5621    Metabolic Disorder Labs:  Lab Results  Component Value Date   HGBA1C 5.0 10/07/2021   MPG 96.8 10/07/2021   MPG 96.8 09/02/2021   Lab Results  Component Value Date   PROLACTIN 66.2 (H) 03/02/2021   PROLACTIN 66.1 (H) 02/04/2020   Lab Results  Component Value Date   CHOL 148 10/07/2021   TRIG 173 (H) 10/07/2021   HDL 39 (L) 10/07/2021   CHOLHDL 3.8 10/07/2021   VLDL 35 10/07/2021   LDLCALC 74 10/07/2021   LDLCALC 165 (H) 05/01/2021    See Psychiatric Specialty Exam and Suicide Risk Assessment completed by Attending Physician prior to discharge.  Discharge destination:  Home  Is patient on multiple antipsychotic therapies at discharge:  Yes,   Do you recommend tapering to monotherapy for antipsychotics?  No   Has Patient had three or more failed trials of antipsychotic monotherapy by history:  Yes,   Antipsychotic medications that previously failed include:   1.  haldol., 2.  clozapril., and 3.  invega.  Recommended Plan for Multiple Antipsychotic Therapies: Additional reason(s) for multiple antispychotic treatment:  pt's severity of illness does not respond to antipsychotic monotherapy   Discharge Instructions     Diet - low sodium heart healthy   Complete by: As directed    Increase  activity slowly   Complete by: As directed       Allergies as of 10/23/2021       Reactions   Bee Venom Anaphylaxis   Coconut Flavor Anaphylaxis, Rash   Fish Allergy Anaphylaxis   Geodon [ziprasidone Hcl] Other (See Comments)   Pt states that this medication causes paralysis of the mouth.     Haloperidol And Related Other (See Comments)   Pt states that this medication  causes paralysis of the mouth, jaw locks up   Lithobid [lithium] Other (See Comments)   Seizure-like activity   Roxicodone [oxycodone] Other (See Comments)   Hallucinations    Seroquel [quetiapine] Other (See Comments)   Severe drowsiness Pt currently taking 50 mg, she is ok with this strength, she does not want higher dose.   Shellfish Allergy Anaphylaxis   Phenergan [promethazine Hcl] Other (See Comments)   Chest pain    Prilosec [omeprazole] Nausea And Vomiting   Pt can take protonix with no problems   Sulfa Antibiotics Other (See Comments)   Chest pain    Tegretol [carbamazepine] Nausea And Vomiting   Prozac [fluoxetine] Other (See Comments)   Increased Depression Suicidal thoughts   Tape Other (See Comments)   Skin tears, can only tolerate paper tape.   Tylenol [acetaminophen] Other (See Comments)   Unknown reaction        Medication List     STOP taking these medications    gabapentin 300 MG capsule Commonly known as: NEURONTIN   ibuprofen 200 MG tablet Commonly known as: ADVIL       TAKE these medications      Indication  albuterol 108 (90 Base) MCG/ACT inhaler Commonly known as: VENTOLIN HFA Inhale 2 puffs into the lungs every 6 (six) hours as needed for wheezing or shortness of breath.  Indication: Asthma   budesonide-formoterol 160-4.5 MCG/ACT inhaler Commonly known as: SYMBICORT Inhale 2 puffs into the lungs 2 (two) times daily.  Indication: Asthma   calcium carbonate 750 MG chewable tablet Commonly known as: TUMS EX Chew 1,500 mg by mouth 2 (two) times daily as needed for  heartburn.  Indication: Heartburn   cariprazine 3 MG capsule Commonly known as: VRAYLAR Take 1 capsule (3 mg total) by mouth daily.  Indication: MIXED BIPOLAR AFFECTIVE DISORDER   cyclobenzaprine 10 MG tablet Commonly known as: FLEXERIL Take 10 mg by mouth at bedtime.  Indication: Fibromyalgia Syndrome   diclofenac Sodium 1 % Gel Commonly known as: Voltaren Apply 2 g topically 4 (four) times daily.  Indication: Fibromyalgia   EMERGEN-C IMMUNE PO Take 1 packet by mouth daily as needed (Immune support).  Indication: Nutritional Support   escitalopram 5 MG tablet Commonly known as: LEXAPRO Take 3 tablets (15 mg total) by mouth daily.  Indication: Generalized Anxiety Disorder   ferrous sulfate 325 (65 FE) MG tablet Take 1 tablet (325 mg total) by mouth every other day.  Indication: Iron Deficiency   hydrOXYzine 25 MG tablet Commonly known as: ATARAX Take 25 mg by mouth 3 (three) times daily as needed for anxiety.  Indication: Feeling Anxious   ICY HOT MAX LIDOCAINE EX Apply 1 Application topically daily as needed (pain).  Indication: pain   neomycin-bacitracin-polymyxin Oint Commonly known as: NEOSPORIN Apply 1 Application topically 2 (two) times daily.  Indication: wound   pantoprazole 40 MG tablet Commonly known as: PROTONIX TAKE 1 TABLET BY MOUTH DAILY What changed: how to take this  Indication: Gastroesophageal Reflux Disease   prazosin 1 MG capsule Commonly known as: MINIPRESS Take 1 capsule (1 mg total) by mouth at bedtime for 7 days.  Indication: Frightening Dreams   pregabalin 50 MG capsule Commonly known as: LYRICA Take 1 capsule (50 mg total) by mouth every 8 (eight) hours for 14 days.  Indication: Generalized Anxiety Disorder, Neuropathic Pain   propranolol 10 MG tablet Commonly known as: INDERAL Take 1 tablet (10 mg total) by mouth 2 (two) times daily.  Indication: Feeling Anxious  QUEtiapine 25 MG tablet Commonly known as: SEROQUEL Take 1  tablet (25 mg total) by mouth at bedtime. What changed:  how much to take when to take this additional instructions  Indication: Depressive Phase of Manic-Depression   QUEtiapine 25 MG tablet Commonly known as: SEROQUEL Take 0.5 tablets (12.5 mg total) by mouth every morning. What changed: You were already taking a medication with the same name, and this prescription was added. Make sure you understand how and when to take each.  Indication: Manic-Depression   QUEtiapine 25 MG tablet Commonly known as: SEROQUEL Take 0.5 tablets (12.5 mg total) by mouth 2 (two) times daily as needed (halluciantions, anxiety). What changed: You were already taking a medication with the same name, and this prescription was added. Make sure you understand how and when to take each.  Indication: Manic-Depression        Follow-up Information     [facility] Follow up.   Why: Please call your CST Team and inform them of your discharge from the hospital. You can also reach Butch Penny, therapist, at [phone] Contact information: [place] [address] [place], [place] on 11/06/2021.   Why: Please follow up with this provider for medication management services on 11/06/21 at 8:00 am. This appointment will be held in person. Contact information: [place] [address] [phone]         [facility] Follow up on 11/02/2021.   Why: with Chancy Milroy, MD Thursday Nov 02, 2021 10:35 AM Center for [facility] [place] [address] [phone] Contact information: [place]                Follow-up recommendations:    Activity: as tolerated  Diet: heart healthy  Other: -Follow-up with your outpatient psychiatric provider -instructions on appointment date, time, and address (location) are provided to you in discharge paperwork.  -Take your  psychiatric medications as prescribed at discharge - instructions are provided to you in the discharge paperwork  -Follow-up with outpatient primary care doctor and other specialists -for management of preventative medicine and chronic medical disease.  -Testing: Follow-up with outpatient provider for abnormal lab results: anemia  -Recommend abstinence from alcohol, tobacco, and other illicit drug use at discharge.   -If your psychiatric symptoms recur, worsen, or if you have side effects to your psychiatric medications, call your outpatient psychiatric provider, 911, [phone] or go to the nearest emergency department.  -If suicidal thoughts recur, call your outpatient psychiatric provider, 911, [phone] or go to the nearest emergency department.   Signed: Christoper Allegra, MD 10/23/2021, 9:37 AM    Total Time Spent in Direct Patient Care:  I personally spent 35 minutes on the unit in direct patient care. The direct patient care time included face-to-face time with the patient, reviewing the patient's chart, communicating with other professionals, and coordinating care. Greater than 50% of this time was spent in counseling or coordinating care with the patient regarding goals of hospitalization, psycho-education, and discharge planning needs.   Janine Limbo, MD Psychiatrist

## 2021-10-23 NOTE — Plan of Care (Signed)
Nurse discussed anxiety, depression and coping skills with patient.  

## 2021-10-23 NOTE — Progress Notes (Signed)
Psychoeducational and Goals Group:     Purpose of Group: The groups focus is to identify and establish SMART goal for the shift. Group members participated in discussion about triggers for depression and anxiety. Participants were encouraged to share what they have learned and to actively listen to others.   Patient's Goal: Pt did not attend group.

## 2021-10-23 NOTE — Progress Notes (Signed)
  [facility] Adult Case Management Discharge Plan :  Will you be returning to the same living situation after discharge:  Yes,  Home  At discharge, do you have transportation home?: Yes,  Taxi  Do you have the ability to pay for your medications: Yes,  [employer]   Release of information consent forms completed and in the chart;  Patient's signature needed at discharge.  Patient to Follow up at:  Follow-up Information     [facility] Follow up.   Why: Please call your CST Team and inform them of your discharge from the hospital. You can also reach Butch Penny, therapist, at [phone] Contact information: [place] [address] [place], [place] on 11/06/2021.   Why: Please follow up with this provider for medication management services on 11/06/21 at 8:00 am. This appointment will be held in person. Contact information: [place] [address] [phone]         [facility] Follow up on 11/02/2021.   Why: with Chancy Milroy, MD Thursday Nov 02, 2021 10:35 AM Center for [place] [facility] [place] [id number] [phone] Contact information: [facility]                Next level of care provider has access to [place] and Suicide Prevention discussed: Yes,  with patient      Has patient been referred to the Quitline?: N/A patient is not a smoker  Patient has been referred for addiction treatment: [place], [place] 10/23/2021, 9:23 AM

## 2021-10-23 NOTE — Progress Notes (Signed)
D:  Patient's self inventory sheet, patient has fair sleep, sleep medication helpful.  Fair appetite, normal energy level, good concentration.  Denied depression and hopeless.  Rated anxiety 2.  Denied withdrawals.  Denied SI.  Denied physical problems.  Denied physical pain.  Goal is discharge.  Plans to be home.  Does have discharge plans. A:  Medications administered per MD orders.  Emotional support and encouragement given patient. R:  Denied SI and HI, contracts for safety.  Denied A/V hallucinations.  Safety maintained with 15 minute checks.  Patient excited about discharge today.  Has been scratching forearm and L lower arm.  Patient given zofran for nausea, refused anxiety or pain medications.

## 2021-10-25 ENCOUNTER — Ambulatory Visit (HOSPITAL_COMMUNITY)
Admission: EM | Admit: 2021-10-25 | Discharge: 2021-10-26 | Disposition: A | Discharge status: Home / Self Care | Payer: PPO | Attending: Psychiatry | Admitting: Psychiatry

## 2021-10-25 ENCOUNTER — Other Ambulatory Visit: Payer: Self-pay

## 2021-10-25 DIAGNOSIS — Z20822 Contact with and (suspected) exposure to covid-19: Secondary | ICD-10-CM | POA: Diagnosis not present

## 2021-10-25 DIAGNOSIS — F250 Schizoaffective disorder, bipolar type: Secondary | ICD-10-CM | POA: Insufficient documentation

## 2021-10-25 DIAGNOSIS — F840 Autistic disorder: Secondary | ICD-10-CM | POA: Insufficient documentation

## 2021-10-25 DIAGNOSIS — Z765 Malingerer [conscious simulation]: Secondary | ICD-10-CM

## 2021-10-25 DIAGNOSIS — F4310 Post-traumatic stress disorder, unspecified: Secondary | ICD-10-CM | POA: Diagnosis not present

## 2021-10-25 DIAGNOSIS — F603 Borderline personality disorder: Secondary | ICD-10-CM | POA: Insufficient documentation

## 2021-10-25 DIAGNOSIS — F419 Anxiety disorder, unspecified: Secondary | ICD-10-CM | POA: Insufficient documentation

## 2021-10-25 DIAGNOSIS — R45851 Suicidal ideations: Secondary | ICD-10-CM | POA: Diagnosis not present

## 2021-10-25 LAB — RESP PANEL BY RT-PCR (FLU A&B, COVID) ARPGX2
Influenza A by PCR: NEGATIVE
Influenza B by PCR: NEGATIVE
SARS Coronavirus 2 by RT PCR: NEGATIVE

## 2021-10-25 LAB — POC SARS CORONAVIRUS 2 AG: SARSCOV2ONAVIRUS 2 AG: NEGATIVE

## 2021-10-25 MED ORDER — PANTOPRAZOLE SODIUM 40 MG PO TBEC
40.0000 mg | DELAYED_RELEASE_TABLET | Freq: Every day | ORAL | Status: DC
  Administered 2021-10-26: 40 mg via ORAL
  Filled 2021-10-25: qty 1

## 2021-10-25 MED ORDER — CYCLOBENZAPRINE HCL 10 MG PO TABS
10.0000 mg | ORAL_TABLET | Freq: Every day | ORAL | Status: DC
  Administered 2021-10-25: 10 mg via ORAL
  Filled 2021-10-25: qty 1

## 2021-10-25 MED ORDER — PREGABALIN 50 MG PO CAPS
50.0000 mg | ORAL_CAPSULE | Freq: Three times a day (TID) | ORAL | Status: DC
  Administered 2021-10-25 – 2021-10-26 (×2): 50 mg via ORAL
  Filled 2021-10-25 (×2): qty 1

## 2021-10-25 MED ORDER — QUETIAPINE FUMARATE 25 MG PO TABS: 12.5000 mg | ORAL_TABLET | Freq: Two times a day (BID) | ORAL | Status: DC | PRN

## 2021-10-25 MED ORDER — CALCIUM CARBONATE ANTACID 500 MG PO CHEW: 1500.0000 mg | CHEWABLE_TABLET | Freq: Two times a day (BID) | ORAL | Status: DC | PRN

## 2021-10-25 MED ORDER — CARIPRAZINE HCL 3 MG PO CAPS
3.0000 mg | ORAL_CAPSULE | Freq: Every day | ORAL | Status: DC
  Administered 2021-10-26: 3 mg via ORAL
  Filled 2021-10-25 (×2): qty 1

## 2021-10-25 MED ORDER — ESCITALOPRAM OXALATE 5 MG PO TABS
15.0000 mg | ORAL_TABLET | Freq: Every day | ORAL | Status: DC
  Administered 2021-10-25 – 2021-10-26 (×2): 15 mg via ORAL
  Filled 2021-10-25 (×2): qty 1

## 2021-10-25 MED ORDER — PRAZOSIN HCL 1 MG PO CAPS
1.0000 mg | ORAL_CAPSULE | Freq: Every day | ORAL | Status: DC
  Administered 2021-10-25: 1 mg via ORAL
  Filled 2021-10-25: qty 1

## 2021-10-25 MED ORDER — FERROUS SULFATE 325 (65 FE) MG PO TABS
325.0000 mg | ORAL_TABLET | ORAL | Status: DC
  Filled 2021-10-25: qty 1

## 2021-10-25 MED ORDER — QUETIAPINE FUMARATE 25 MG PO TABS
25.0000 mg | ORAL_TABLET | Freq: Every day | ORAL | Status: DC
  Administered 2021-10-25: 25 mg via ORAL
  Filled 2021-10-25: qty 1

## 2021-10-25 MED ORDER — ALUM & MAG HYDROXIDE-SIMETH 200-200-20 MG/5ML PO SUSP: 30.0000 mL | ORAL | Status: DC | PRN

## 2021-10-25 MED ORDER — MAGNESIUM HYDROXIDE 400 MG/5ML PO SUSP
30.0000 mL | Freq: Every day | ORAL | Status: DC | PRN
  Administered 2021-10-25: 30 mL via ORAL
  Filled 2021-10-25: qty 30

## 2021-10-25 MED ORDER — QUETIAPINE FUMARATE 25 MG PO TABS
12.5000 mg | ORAL_TABLET | Freq: Every morning | ORAL | Status: DC
  Administered 2021-10-26: 12.5 mg via ORAL
  Filled 2021-10-25: qty 1

## 2021-10-25 MED ORDER — NEOSPORIN ORIGINAL 3.5-400-5000 EX OINT
1.0000 | TOPICAL_OINTMENT | Freq: Two times a day (BID) | CUTANEOUS | Status: DC
  Administered 2021-10-26: 1 via TOPICAL
  Filled 2021-10-25: qty 1
  Filled 2021-10-25: qty 14.17

## 2021-10-25 MED ORDER — PROPRANOLOL HCL 10 MG PO TABS
10.0000 mg | ORAL_TABLET | Freq: Two times a day (BID) | ORAL | Status: DC
  Administered 2021-10-25 – 2021-10-26 (×2): 10 mg via ORAL
  Filled 2021-10-25 (×2): qty 1

## 2021-10-25 NOTE — ED Notes (Signed)
Pt sleeping at present, no distress noted.  Monitoring for safety. 
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
## 2021-10-25 NOTE — BH Assessment (Addendum)
Comprehensive Clinical Assessment (CCA) Screening, Triage and Referral Note  10/25/2021 Nylene Inlow [id number]  Triage/Screening completed. Patient is Urgent. Currently in room 134 waiting on MSE with the provider.    Chief Complaint: Suicidal ideations, Self Injurious behaviors, Depression, Homicidal Thoughts  Visit Diagnosis: Asperger's Syndrome, Post traumatic Stress Disorder, Anxiety, Schizoaffective bipolar type, and Borderline Personality Disorder.    Patient Reported Information How did you hear about [name]? Legal System  What Is the Reason for Your Visit/Call Today?  Sarayu Prevost is a 31 year old female patient who has a hx of Asperger's Syndrome, Post traumatic Stress Disorder, Anxiety, Schizoaffective bipolar type, and Borderline Personality Disorder. She presents to the [facility], voluntarily, transported by [facility]. Patient has a complaint today of suicidal ideations to run through her glass window at home. Hx of self injurious behaviors (cutting). Last occurrence was earlier today, she used a razor to cut her right forearm. She has evidence of superficial cuts to her arm and states, "This was the only I knew you would have to keep me and admit me".  Multiple prior suicide attempts/gestures and inpatient hospitalizations. Severe depressive/anxiety symptoms. She reports suicidal ideations daily for the past month triggered by financial issues, loss of her job at [employer] today after 1 mo of employment, and loosing custody of her daughter. Currently, homicidal toward her mother in law because she has custody of her 7 y/o child and patient wants her child back. No plan/intent to harm her mother in law. Denies alcohol/drug use. History of auditory/visual hallucinations. However, says her Seroquel controls related symptoms. Receives outpatient services at [facility] in [place]. Lives with boyfriend.   How Long Has This Been Causing You Problems? > than 6 months  What Do  You Feel Would Help You the Most Today? Treatment for Depression or other mood problem   Have You Recently Had Any Thoughts About Hurting Yourself? Yes  Are You Planning to Commit Suicide/Harm Yourself At This time? Yes   Have you Recently Had Thoughts About Hurting Someone [name]? No  Are You Planning to Harm Someone at This Time? No  Explanation: No data recorded  Have You Used Any Alcohol or Drugs in the Past 24 Hours? No  How Long Ago Did You Use Drugs or Alcohol? No data recorded What Did You Use and How Much? Denies   Do You Currently Have a Therapist/Psychiatrist? Yes  Name of Therapist/Psychiatrist: CST and medication through [facility] Recently Discharged From Any Office Practice or Programs? Yes  Explanation of Discharge From Practice/Program: Discharged from [facility] 07/29/2021  Location of Assessment: [facility] Assessment Services  Provider Location: [facility] Assessment Services  Collateral Involvement: none reported   Does Patient Have a [place]? No data recorded Name and Contact of Legal Guardian: No data recorded If Minor and Not Living with Parent(s), Who has Custody? NA  Is CPS involved or ever been involved? For her now 16 yr old child only.   Is APS involved or ever been involved? Never   Patient Determined To Be At Risk for Harm To Self or Others Based on Review of Patient Reported Information or Presenting Complaint? Yes, for Self-Harm  Method: No Availability of Means: Razor IDo You Have any Outstanding Charges, Pending Court Dates, Parole/Probation? No Contacted To Inform of Risk of Harm To Self or Others: Unable to Contact:   Does Patient Present under Involuntary Commitment? No  IVC Papers Initial File Date: 10/25/2021  [place] of Residence: [name]   Patient Currently Receiving the Following Services:  ACTT services, CST Marine scientist); Medication Management; Individual  Therapy   Determination of Need: Urgent (48 hours)   Options For Referral: Outpatient Therapy; Medication Management; Inpatient Hospitalization; Other: Comment (ACTT services)      Waldon Merl, Counselor

## 2021-10-25 NOTE — BH Assessment (Incomplete)
[name] is a 31 year old female patient who has a hx of Asperger's Syndrome, Post traumatic Stress Disorder, Anxiety, Schizoaffective bipolar type, and Borderline Personality Disorder. She presents to the [facility], voluntarily, transported by [facility]. Patient has a complaint today of suicidal ideations to run through her glass window at home. Hx of self injurious behaviors (cutting). Last occurrence was earlier today, she used a razor to cut her right forearm. She has evidence of superficial cuts to her arm and states, "This was the only I knew you would have to keep me and admit me".  Multiple prior suicide attempts/gestures and inpatient hospitalizations. Severe depressive/anxiety symptoms. She reports suicidal ideations daily for the past month triggered by financial issues, loss of her job at [employer] today after 1 mo of employment, and loosing custody of her daughter. Currently, homicidal toward her mother in law because she has custody of her 29 y/o child and patient wants her child back. No plan/intent to harm her mother in law. Denies alcohol/drug use. History of auditory/visual hallucinations. However, says her Seroquel controls related symptoms. Receives outpatient services at [facility] in [place]. Lives with boyfriend.

## 2021-10-25 NOTE — ED Provider Notes (Signed)
[facility] Continuous Assessment Admission H&P  Date: 10/26/21 Patient Name: [name] MRN: [id number] Chief Complaint:  Chief Complaint  Patient presents with   Suicidal      Diagnoses:  Final diagnoses:  Suicidal ideation  Malingering  Schizoaffective disorder, bipolar type [facility])    HPI: [name],  31 y.o female with a extensive history of behavior health visits.  Patient has a history of schizoaffective disorder bipolar type, anxiety, autism spectrum disorder, PTSD, and borderline personality disorder presented to [facility] by [facility].  Per the patient she is suicidal with plans to cut her wrist and run through her glass window.  Patient does have fresh cuts to her left wrist patient stated I just want to kill myself.  Patient has a known history of self-injuries.  patient was last seen and discharged from [facility] on 10/23/21.   According to patient she self inflicted bruises to her hands because she knows that there only reason we will keep her.  Face-to-face observation of patient, patient is alert and oriented x 4, speech is clear, maintaining minimal to no eye contact.  Mood is anxious and depressed affect flat.  Upon entering the room patient is observed pacing the hallway, patient endorsed suicidal ideation that she planned to kill herself.  Patient denies alcohol or drug use, denies HI, AVH or paranoia.  According to notes patient does receive outpatient services at [facility] in [place] and lives at home with her boyfriend.  Recommend overnight observation     PHQ 2-9:  [place] ED from 06/26/2021 in [place] ED from 05/01/2021 in [facility] ED from 02/03/2020 in [facility]  Thoughts that you would be better off dead, or of hurting yourself in some way Several days Nearly every day Nearly every day  [Phreesia 02/03/2020]  PHQ-9 Total Score '9 17 27        '$ Flowsheet Row ED from 10/25/2021 in [facility] Most recent reading at 10/25/2021 10:47 PM Admission (Discharged) from 10/18/2021 in [facility] [facility] Most recent reading at 10/18/2021 10:54 PM ED from 10/18/2021 in [facility] Most recent reading at 10/18/2021  3:55 PM  C-SSRS RISK CATEGORY High Risk High Risk High Risk        Total Time spent with patient: 20 minutes  Musculoskeletal  Strength & Muscle Tone: within normal limits Gait & Station: normal Patient leans: N/A  Psychiatric Specialty Exam  Presentation General Appearance: Casual  Eye Contact:Fair  Speech:Clear and Coherent  Speech Volume:Normal  Handedness:Right   Mood and Affect  Mood:Euphoric  Affect:Appropriate   Thought Process  Thought Processes:Linear  Descriptions of Associations:Intact  Orientation:Full (Time, Place and Person)  Thought Content:WDL  Diagnosis of Schizophrenia or Schizoaffective disorder in past: Yes  Duration of Psychotic Symptoms: Greater than six months  Hallucinations:Hallucinations: None  Ideas of Reference:None  Suicidal Thoughts:Suicidal Thoughts: No SI Active Intent and/or Plan: Without Plan SI Passive Intent and/or Plan: Without Intent  Homicidal Thoughts:Homicidal Thoughts: No   Sensorium  Memory:Immediate Fair  Judgment:Fair  Insight:Fair   Executive Functions  Concentration:Fair  Attention Span:Fair  [place] of Knowledge:Good  Language:Good   Psychomotor Activity  Psychomotor Activity:Psychomotor Activity: Normal   Assets  Assets:Communication Skills   Sleep  Sleep:Sleep: Fair Number of Hours of Sleep: 3   Nutritional Assessment (For OBS and FBC admissions only) Has the patient had a weight loss or  gain of 10 pounds or more in the last 3 months?: Yes Has the patient had a decrease in food intake/or appetite?: No Does the patient have  dental problems?: No Does the patient have eating habits or behaviors that may be indicators of an eating disorder including binging or inducing vomiting?: No Has the patient recently lost weight without trying?: 0 Has the patient been eating poorly because of a decreased appetite?: 0 Malnutrition Screening Tool Score: 0    Physical Exam HENT:     Head: Normocephalic.     Nose: Nose normal.  Cardiovascular:     Rate and Rhythm: Normal rate.  Pulmonary:     Effort: Pulmonary effort is normal.  Musculoskeletal:        General: Normal range of motion.     Cervical back: Normal range of motion.  Skin:    Findings: Bruising present.  Neurological:     General: No focal deficit present.     Mental Status: She is alert.  Psychiatric:        Mood and Affect: Mood normal.        Behavior: Behavior normal.        Thought Content: Thought content normal.        Judgment: Judgment normal.    Review of Systems  Constitutional: Negative.   HENT: Negative.    Eyes: Negative.   Respiratory: Negative.    Cardiovascular: Negative.   Skin: Negative.   Neurological: Negative.   Endo/Heme/Allergies: Negative.   Psychiatric/Behavioral:  Positive for suicidal ideas. The patient is nervous/anxious.     Blood pressure (!) 104/58, pulse 93, temperature 98.8 F (37.1 C), temperature source Oral, resp. rate 18, SpO2 99 %. There is no height or weight on file to calculate BMI.  Past Psychiatric History:  schizoaffective disorder, suicidal ideation, autism spectrum disorder  Is the patient at risk to self? Yes  Has the patient been a risk to self in the past 6 months? Yes .    Has the patient been a risk to self within the distant past? Yes   Is the patient a risk to others? No   Has the patient been a risk to others in the past 6 months? No   Has the patient been a risk to others within the distant past? No   Past Medical History:  Past Medical History:  Diagnosis Date   Acid reflux     Anxiety    Asthma    last attack 03/13/15 or 03/14/15   Autism    Carrier of fragile X syndrome    Chronic constipation    Depression    Drug-seeking behavior    Essential tremor    Headache    Overdose of acetaminophen 07/2017   and other meds   Personality disorder ([place])    Schizo-affective psychosis ([place])    Schizoaffective disorder, bipolar type ([place])    Seizures ([place])    Last seizure December 2017   Sleep apnea     Past Surgical History:  Procedure Laterality Date   MOUTH SURGERY  2009 or 2010    Family History:  Family History  Problem Relation Age of Onset   Mental illness Father    Asthma Father    PDD Brother    Seizures Brother     Social History:  Social History   Socioeconomic History   Marital status: Widowed    Spouse name: Not on file   Number of children: 0   Years  of education: Not on file   Highest education level: Not on file  Occupational History   Occupation: disability  Tobacco Use   Smoking status: Former    Packs/day: 0.00    Types: Cigarettes   Smokeless tobacco: Never   Tobacco comments:    Smoked for 2  years age [age]  Vaping Use   Vaping Use: Never used  Substance and Sexual Activity   Alcohol use: No    Alcohol/week: 1.0 standard drink of alcohol    Types: 1 Standard drinks or equivalent per week    Comment: denies at this time   Drug use: No    Comment: History of cocaine use at age 76 for 4 months   Sexual activity: Not Currently    Birth control/protection: None  Other Topics Concern   Not on file  Social History Narrative   Marital status: Widowed      Children: daughter      Lives: with boyfriend, in two story home      Employment:  Disability      Tobacco: quit smoking; smoked for two years.      Alcohol ;none      Drugs: none   Has not traveled outside of the country.   Right handed         Social Determinants of Health   Financial Resource Strain: Not on file  Food Insecurity: No Food Insecurity  (10/18/2021)   Hunger Vital Sign    Worried About Running Out of Food in the Last Year: Never true    Ran Out of Food in the Last Year: Never true  Transportation Needs: No Transportation Needs (10/18/2021)   PRAPARE - Hydrologist (Medical): No    Lack of Transportation (Non-Medical): No  Physical Activity: Not on file  Stress: Not on file  Social Connections: Not on file  Intimate Partner Violence: Not At Risk (10/18/2021)   Humiliation, Afraid, Rape, and Kick questionnaire    Fear of Current or Ex-Partner: No    Emotionally Abused: No    Physically Abused: No    Sexually Abused: No    SDOH:  [place]: No Food Insecurity (10/18/2021)  Housing: Medium Risk (10/18/2021)  Transportation Needs: No Transportation Needs (10/18/2021)  Utilities: Not At Risk (10/18/2021)  Alcohol Screen: Low Risk  (10/18/2021)  Depression (PHQ2-9): Medium Risk (06/27/2021)  Tobacco Use: Medium Risk (10/18/2021)    Last Labs:  Admission on 10/25/2021  Component Date Value Ref Range Status   SARS Coronavirus 2 by RT PCR 10/25/2021 NEGATIVE  NEGATIVE Final   Comment: (NOTE) SARS-CoV-2 target nucleic acids are NOT DETECTED.  The SARS-CoV-2 RNA is generally detectable in upper respiratory specimens during the acute phase of infection. The lowest concentration of SARS-CoV-2 viral copies this assay can detect is 138 copies/mL. A negative result does not preclude SARS-Cov-2 infection and should not be used as the sole basis for treatment or other patient management decisions. A negative result may occur with  improper specimen collection/handling, submission of specimen other than nasopharyngeal swab, presence of viral mutation(s) within the areas targeted by this assay, and inadequate number of viral copies(<138 copies/mL). A negative result must be combined with clinical observations, patient history, and epidemiological information. The expected result  is Negative.  Fact Sheet for Patients:  EntrepreneurPulse.com.au  Fact Sheet for Healthcare Providers:  IncredibleEmployment.be  This test is no  t yet approved or cleared by the [place] and  has been authorized for detection and/or diagnosis of SARS-CoV-2 by FDA under an Emergency Use Authorization (EUA). This EUA will remain  in effect (meaning this test can be used) for the duration of the COVID-19 declaration under Section 564(b)(1) of the Act, 21 U.S.C.section 360bbb-3(b)(1), unless the authorization is terminated  or revoked sooner.       Influenza A by PCR 10/25/2021 NEGATIVE  NEGATIVE Final   Influenza B by PCR 10/25/2021 NEGATIVE  NEGATIVE Final   Comment: (NOTE) The Xpert Xpress SARS-CoV-2/FLU/RSV plus assay is intended as an aid in the diagnosis of influenza from Nasopharyngeal swab specimens and should not be used as a sole basis for treatment. Nasal washings and aspirates are unacceptable for Xpert Xpress SARS-CoV-2/FLU/RSV testing.  Fact Sheet for Patients: EntrepreneurPulse.com.au  Fact Sheet for Healthcare Providers: IncredibleEmployment.be  This test is not yet approved or cleared by the [place] FDA and has been authorized for detection and/or diagnosis of SARS-CoV-2 by FDA under an Emergency Use Authorization (EUA). This EUA will remain in effect (meaning this test can be used) for the duration of the COVID-19 declaration under Section 564(b)(1) of the Act, 21 U.S.C. section 360bbb-3(b)(1), unless the authorization is terminated or revoked.  Performed at [facility], [place] [address], [place], [place] [address]    POC Amphetamine UR 10/26/2021 None Detected  NONE DETECTED (Cut Off Level 1000 ng/mL) Final   POC Secobarbital (BAR) 10/26/2021 None Detected  NONE DETECTED (Cut Off Level 300 ng/mL) Final   POC Buprenorphine (BUP) 10/26/2021  None Detected  NONE DETECTED (Cut Off Level 10 ng/mL) Final   POC Oxazepam (BZO) 10/26/2021 None Detected  NONE DETECTED (Cut Off Level 300 ng/mL) Final   POC Cocaine UR 10/26/2021 None Detected  NONE DETECTED (Cut Off Level 300 ng/mL) Final   POC Methamphetamine UR 10/26/2021 None Detected  NONE DETECTED (Cut Off Level 1000 ng/mL) Final   POC Morphine 10/26/2021 None Detected  NONE DETECTED (Cut Off Level 300 ng/mL) Final   POC Methadone UR 10/26/2021 None Detected  NONE DETECTED (Cut Off Level 300 ng/mL) Final   POC Oxycodone UR 10/26/2021 None Detected  NONE DETECTED (Cut Off Level 100 ng/mL) Final   POC Marijuana UR 10/26/2021 None Detected  NONE DETECTED (Cut Off Level 50 ng/mL) Final   SARSCOV2ONAVIRUS 2 AG 10/25/2021 NEGATIVE  NEGATIVE Final   Comment: (NOTE) SARS-CoV-2 antigen NOT DETECTED.   Negative results are presumptive.  Negative results do not preclude SARS-CoV-2 infection and should not be used as the sole basis for treatment or other patient management decisions, including infection  control decisions, particularly in the presence of clinical signs and  symptoms consistent with COVID-19, or in those who have been in contact with the virus.  Negative results must be combined with clinical observations, patient history, and epidemiological information. The expected result is Negative.  Fact Sheet for Patients: HandmadeRecipes.com.cy  Fact Sheet for Healthcare Providers: FuneralLife.at  This test is not yet approved or cleared by the [place] FDA and  has been authorized for detection and/or diagnosis of SARS-CoV-2 by FDA under an Emergency Use Authorization (EUA).  This EUA will remain in effect (meaning this test can be used) for the duration of  the COV                          ID-19 declaration under Section 564(b)(1) of the Act,  21 U.S.C. section 360bbb-3(b)(1), unless the authorization is terminated or revoked  sooner.     Preg Test, Ur 10/26/2021 NEGATIVE  NEGATIVE Final   Comment:        THE SENSITIVITY OF THIS METHODOLOGY IS >24 mIU/mL   Admission on 10/18/2021, Discharged on 10/18/2021  Component Date Value Ref Range Status   SARS Coronavirus 2 by RT PCR 10/18/2021 NEGATIVE  NEGATIVE Final   Comment: (NOTE) SARS-CoV-2 target nucleic acids are NOT DETECTED.  The SARS-CoV-2 RNA is generally detectable in upper respiratory specimens during the acute phase of infection. The lowest concentration of SARS-CoV-2 viral copies this assay can detect is 138 copies/mL. A negative result does not preclude SARS-Cov-2 infection and should not be used as the sole basis for treatment or other patient management decisions. A negative result may occur with  improper specimen collection/handling, submission of specimen other than nasopharyngeal swab, presence of viral mutation(s) within the areas targeted by this assay, and inadequate number of viral copies(<138 copies/mL). A negative result must be combined with clinical observations, patient history, and epidemiological information. The expected result is Negative.  Fact Sheet for Patients:  EntrepreneurPulse.com.au  Fact Sheet for Healthcare Providers:  IncredibleEmployment.be  This test is no                          t yet approved or cleared by the [place] FDA and  has been authorized for detection and/or diagnosis of SARS-CoV-2 by FDA under an Emergency Use Authorization (EUA). This EUA will remain  in effect (meaning this test can be used) for the duration of the COVID-19 declaration under Section 564(b)(1) of the Act, 21 U.S.C.section 360bbb-3(b)(1), unless the authorization is terminated  or revoked sooner.       Influenza A by PCR 10/18/2021 NEGATIVE  NEGATIVE Final   Influenza B by PCR 10/18/2021 NEGATIVE  NEGATIVE Final   Comment: (NOTE) The Xpert Xpress SARS-CoV-2/FLU/RSV plus assay is  intended as an aid in the diagnosis of influenza from Nasopharyngeal swab specimens and should not be used as a sole basis for treatment. Nasal washings and aspirates are unacceptable for Xpert Xpress SARS-CoV-2/FLU/RSV testing.  Fact Sheet for Patients: EntrepreneurPulse.com.au  Fact Sheet for Healthcare Providers: IncredibleEmployment.be  This test is not yet approved or cleared by the [place] FDA and has been authorized for detection and/or diagnosis of SARS-CoV-2 by FDA under an Emergency Use Authorization (EUA). This EUA will remain in effect (meaning this test can be used) for the duration of the COVID-19 declaration under Section 564(b)(1) of the Act, 21 U.S.C. section 360bbb-3(b)(1), unless the authorization is terminated or revoked.  Performed at [facility], [place] [address] [place], [place] [address]    POC Amphetamine UR 10/18/2021 None Detected  NONE DETECTED (Cut Off Level 1000 ng/mL) Final   POC Secobarbital (BAR) 10/18/2021 None Detected  NONE DETECTED (Cut Off Level 300 ng/mL) Final   POC Buprenorphine (BUP) 10/18/2021 None Detected  NONE DETECTED (Cut Off Level 10 ng/mL) Final   POC Oxazepam (BZO) 10/18/2021 None Detected  NONE DETECTED (Cut Off Level 300 ng/mL) Final   POC Cocaine UR 10/18/2021 None Detected  NONE DETECTED (Cut Off Level 300 ng/mL) Final   POC Methamphetamine UR 10/18/2021 None Detected  NONE DETECTED (Cut Off Level 1000 ng/mL) Final   POC Morphine 10/18/2021 None Detected  NONE DETECTED (Cut Off Level 300 ng/mL) Final   POC Methadone UR 10/18/2021 None Detected  NONE  DETECTED (Cut Off Level 300 ng/mL) Final   POC Oxycodone UR 10/18/2021 None Detected  NONE DETECTED (Cut Off Level 100 ng/mL) Final   POC Marijuana UR 10/18/2021 None Detected  NONE DETECTED (Cut Off Level 50 ng/mL) Final   SARSCOV2ONAVIRUS 2 AG 10/18/2021 NEGATIVE  NEGATIVE Final   Comment: (NOTE) SARS-CoV-2 antigen NOT DETECTED.    Negative results are presumptive.  Negative results do not preclude SARS-CoV-2 infection and should not be used as the sole basis for treatment or other patient management decisions, including infection  control decisions, particularly in the presence of clinical signs and  symptoms consistent with COVID-19, or in those who have been in contact with the virus.  Negative results must be combined with clinical observations, patient history, and epidemiological information. The expected result is Negative.  Fact Sheet for Patients: HandmadeRecipes.com.cy  Fact Sheet for Healthcare Providers: FuneralLife.at  This test is not yet approved or cleared by the [place] FDA and  has been authorized for detection and/or diagnosis of SARS-CoV-2 by FDA under an Emergency Use Authorization (EUA).  This EUA will remain in effect (meaning this test can be used) for the duration of  the COV                          ID-19 declaration under Section 564(b)(1) of the Act, 21 U.S.C. section 360bbb-3(b)(1), unless the authorization is terminated or revoked sooner.     Preg Test, Ur 10/18/2021 NEGATIVE  NEGATIVE Final   Comment:        THE SENSITIVITY OF THIS METHODOLOGY IS >24 mIU/mL   Admission on 10/11/2021, Discharged on 10/14/2021  Component Date Value Ref Range Status   SARS Coronavirus 2 by RT PCR 10/11/2021 NEGATIVE  NEGATIVE Final   Comment: (NOTE) SARS-CoV-2 target nucleic acids are NOT DETECTED.  The SARS-CoV-2 RNA is generally detectable in upper respiratory specimens during the acute phase of infection. The lowest concentration of SARS-CoV-2 viral copies this assay can detect is 138 copies/mL. A negative result does not preclude SARS-Cov-2 infection and should not be used as the sole basis for treatment or other patient management decisions. A negative result may occur with  improper specimen collection/handling, submission of  specimen other than nasopharyngeal swab, presence of viral mutation(s) within the areas targeted by this assay, and inadequate number of viral copies(<138 copies/mL). A negative result must be combined with clinical observations, patient history, and epidemiological information. The expected result is Negative.  Fact Sheet for Patients:  EntrepreneurPulse.com.au  Fact Sheet for Healthcare Providers:  IncredibleEmployment.be  This test is no                          t yet approved or cleared by the [place] FDA and  has been authorized for detection and/or diagnosis of SARS-CoV-2 by FDA under an Emergency Use Authorization (EUA). This EUA will remain  in effect (meaning this test can be used) for the duration of the COVID-19 declaration under Section 564(b)(1) of the Act, 21 U.S.C.section 360bbb-3(b)(1), unless the authorization is terminated  or revoked sooner.       Influenza A by PCR 10/11/2021 NEGATIVE  NEGATIVE Final   Influenza B by PCR 10/11/2021 NEGATIVE  NEGATIVE Final   Comment: (NOTE) The Xpert Xpress SARS-CoV-2/FLU/RSV plus assay is intended as an aid in the diagnosis of influenza from Nasopharyngeal swab specimens and should not be used as a sole basis for  treatment. Nasal washings and aspirates are unacceptable for Xpert Xpress SARS-CoV-2/FLU/RSV testing.  Fact Sheet for Patients: EntrepreneurPulse.com.au  Fact Sheet for Healthcare Providers: IncredibleEmployment.be  This test is not yet approved or cleared by the [place] FDA and has been authorized for detection and/or diagnosis of SARS-CoV-2 by FDA under an Emergency Use Authorization (EUA). This EUA will remain in effect (meaning this test can be used) for the duration of the COVID-19 declaration under Section 564(b)(1) of the Act, 21 U.S.C. section 360bbb-3(b)(1), unless the authorization is terminated  or revoked.  Performed at [facility], [place] [address] [place], [place] [address]    Sodium 10/11/2021 137  135 - 145 mmol/L Final   Potassium 10/11/2021 3.6  3.5 - 5.1 mmol/L Final   Chloride 10/11/2021 105  98 - 111 mmol/L Final   CO2 10/11/2021 24  22 - 32 mmol/L Final   Glucose, Bld 10/11/2021 87  70 - 99 mg/dL Final   Glucose reference range applies only to samples taken after fasting for at least 8 hours.   BUN 10/11/2021 11  6 - 20 mg/dL Final   Creatinine, Ser 10/11/2021 0.78  0.44 - 1.00 mg/dL Final   Calcium 10/11/2021 9.1  8.9 - 10.3 mg/dL Final   Total Protein 10/11/2021 6.7  6.5 - 8.1 g/dL Final   Albumin 10/11/2021 3.8  3.5 - 5.0 g/dL Final   AST 10/11/2021 18  15 - 41 U/L Final   ALT 10/11/2021 16  0 - 44 U/L Final   Alkaline Phosphatase 10/11/2021 65  38 - 126 U/L Final   Total Bilirubin 10/11/2021 <0.1 (L)  0.3 - 1.2 mg/dL Final   GFR, Estimated 10/11/2021 >60  >60 mL/min Final   Comment: (NOTE) Calculated using the CKD-EPI Creatinine Equation (2021)    Anion gap 10/11/2021 8  5 - 15 Final   Performed at [place] [address] [place], [place] [address]   Alcohol, Ethyl (B) 10/11/2021 <10  <10 mg/dL Final   Comment: (NOTE) Lowest detectable limit for serum alcohol is 10 mg/dL.  For medical purposes only. Performed at [facility], [place] [address], [place], [place] [address]    Opiates 10/11/2021 NONE DETECTED  NONE DETECTED Final   Cocaine 10/11/2021 NONE DETECTED  NONE DETECTED Final   Benzodiazepines 10/11/2021 NONE DETECTED  NONE DETECTED Final   Amphetamines 10/11/2021 NONE DETECTED  NONE DETECTED Final   Tetrahydrocannabinol 10/11/2021 NONE DETECTED  NONE DETECTED Final   Barbiturates 10/11/2021 NONE DETECTED  NONE DETECTED Final   Comment: (NOTE) DRUG SCREEN FOR MEDICAL PURPOSES ONLY.  IF CONFIRMATION IS NEEDED FOR ANY PURPOSE, NOTIFY LAB WITHIN 5 DAYS.  LOWEST DETECTABLE LIMITS FOR URINE DRUG SCREEN Drug Class                      Cutoff (ng/mL) Amphetamine and metabolites    1000 Barbiturate and metabolites    200 Benzodiazepine                 791 Tricyclics and metabolites     300 Opiates and metabolites        300 Cocaine and metabolites        300 THC                            50 Performed at [facility], [place] [address] [place], [place] [address]  WBC 10/11/2021 8.2  4.0 - 10.5 K/uL Final   RBC 10/11/2021 4.32  3.87 - 5.11 MIL/uL Final   Hemoglobin 10/11/2021 11.2 (L)  12.0 - 15.0 g/dL Final   HCT 10/11/2021 36.5  36.0 - 46.0 % Final   MCV 10/11/2021 84.5  80.0 - 100.0 fL Final   MCH 10/11/2021 25.9 (L)  26.0 - 34.0 pg Final   MCHC 10/11/2021 30.7  30.0 - 36.0 g/dL Final   RDW 10/11/2021 13.8  11.5 - 15.5 % Final   Platelets 10/11/2021 285  150 - 400 K/uL Final   nRBC 10/11/2021 0.0  0.0 - 0.2 % Final   Neutrophils Relative % 10/11/2021 59  % Final   Neutro Abs 10/11/2021 5.0  1.7 - 7.7 K/uL Final   Lymphocytes Relative 10/11/2021 31  % Final   Lymphs Abs 10/11/2021 2.5  0.7 - 4.0 K/uL Final   Monocytes Relative 10/11/2021 7  % Final   Monocytes Absolute 10/11/2021 0.6  0.1 - 1.0 K/uL Final   Eosinophils Relative 10/11/2021 1  % Final   Eosinophils Absolute 10/11/2021 0.1  0.0 - 0.5 K/uL Final   Basophils Relative 10/11/2021 1  % Final   Basophils Absolute 10/11/2021 0.0  0.0 - 0.1 K/uL Final   Immature Granulocytes 10/11/2021 1  % Final   Abs Immature Granulocytes 10/11/2021 0.04  0.00 - 0.07 K/uL Final   Performed at [place] [facility], [place] [address] [place], [place] [address]   I-stat hCG, quantitative 10/12/2021 <5.0  <5 mIU/mL Final   Comment 3 10/12/2021          Final   Comment:   GEST. AGE      CONC.  (mIU/mL)   <=1 WEEK        5 - 50     2 WEEKS       50 - 500     3 WEEKS       100 - 10,000     4 WEEKS     1,000 - 30,000        FEMALE AND NON-PREGNANT FEMALE:     LESS THAN 5 mIU/mL   Admission on 10/06/2021, Discharged on 10/10/2021  Component Date Value Ref  Range Status   Iron 10/07/2021 50  28 - 170 ug/dL Final   TIBC 10/07/2021 361  250 - 450 ug/dL Final   Saturation Ratios 10/07/2021 14  10.4 - 31.8 % Final   UIBC 10/07/2021 311  ug/dL Final   Performed at [facility], [place] [address], [place], [place] [address]   Ferritin 10/07/2021 8 (L)  11 - 307 ng/mL Final   Performed at [place] [address], [place], [place] [address]   Retic Ct Pct 10/07/2021 1.7  0.4 - 3.1 % Final   RBC. 10/07/2021 4.07  3.87 - 5.11 MIL/uL Final   Retic Count, Absolute 10/07/2021 70.0  19.0 - 186.0 K/uL Final   Immature Retic Fract 10/07/2021 17.1 (H)  2.3 - 15.9 % Final   Performed at [facility], [place] [address] [place], [place] [address]   Cholesterol 10/07/2021 148  0 - 200 mg/dL Final   Triglycerides 10/07/2021 173 (H)  <150 mg/dL Final   HDL 10/07/2021 39 (L)  >40 mg/dL Final   Total CHOL/HDL Ratio 10/07/2021 3.8  RATIO Final   VLDL 10/07/2021 35  0 - 40 mg/dL Final   LDL Cholesterol 10/07/2021 74  0 - 99 mg/dL Final  Comment:        Total Cholesterol/HDL:CHD Risk Coronary Heart Disease Risk Table                     Men   Women  1/2 Average Risk   3.4   3.3  Average Risk       5.0   4.4  2 X Average Risk   9.6   7.1  3 X Average Risk  23.4   11.0        Use the calculated Patient Ratio above and the CHD Risk Table to determine the patient's CHD Risk.        ATP III CLASSIFICATION (LDL):  <100     mg/dL   Optimal  100-129  mg/dL   Near or Above                    Optimal  130-159  mg/dL   Borderline  160-189  mg/dL   High  >190     mg/dL   Very High Performed at [place] [address] [place], [place] [address]    TSH 10/07/2021 1.511  0.350 - 4.500 uIU/mL Final   Comment: Performed by a 3rd Generation assay with a functional sensitivity of <=0.01 uIU/mL. Performed at [facility], [place] [address], [place], [place] [address]    Hgb  A1c MFr Bld 10/07/2021 5.0  4.8 - 5.6 % Final   Comment: (NOTE) Pre diabetes:          5.7%-6.4%  Diabetes:              >6.4%  Glycemic control for   <7.0% adults with diabetes    Mean Plasma Glucose 10/07/2021 96.8  mg/dL Final   Performed at [facility], [place] [address], [place], [place] [address]   SARS Coronavirus 2 by RT PCR 10/08/2021 NEGATIVE  NEGATIVE Final   Comment: (NOTE) SARS-CoV-2 target nucleic acids are NOT DETECTED.  The SARS-CoV-2 RNA is generally detectable in upper respiratory specimens during the acute phase of infection. The lowest concentration of SARS-CoV-2 viral copies this assay can detect is 138 copies/mL. A negative result does not preclude SARS-Cov-2 infection and should not be used as the sole basis for treatment or other patient management decisions. A negative result may occur with  improper specimen collection/handling, submission of specimen other than nasopharyngeal swab, presence of viral mutation(s) within the areas targeted by this assay, and inadequate number of viral copies(<138 copies/mL). A negative result must be combined with clinical observations, patient history, and epidemiological information. The expected result is Negative.  Fact Sheet for Patients:  EntrepreneurPulse.com.au  Fact Sheet for Healthcare Providers:  IncredibleEmployment.be  This test is no                          t yet approved or cleared by the [place] FDA and  has been authorized for detection and/or diagnosis of SARS-CoV-2 by FDA under an Emergency Use Authorization (EUA). This EUA will remain  in effect (meaning this test can be used) for the duration of the COVID-19 declaration under Section 564(b)(1) of the Act, 21 U.S.C.section 360bbb-3(b)(1), unless the authorization is terminated  or revoked sooner.       Influenza A by PCR 10/08/2021 NEGATIVE  NEGATIVE Final   Influenza B by PCR 10/08/2021 NEGATIVE   NEGATIVE Final   Comment: (NOTE) The Xpert Xpress  SARS-CoV-2/FLU/RSV plus assay is intended as an aid in the diagnosis of influenza from Nasopharyngeal swab specimens and should not be used as a sole basis for treatment. Nasal washings and aspirates are unacceptable for Xpert Xpress SARS-CoV-2/FLU/RSV testing.  Fact Sheet for Patients: EntrepreneurPulse.com.au  Fact Sheet for Healthcare Providers: IncredibleEmployment.be  This test is not yet approved or cleared by the [place] FDA and has been authorized for detection and/or diagnosis of SARS-CoV-2 by FDA under an Emergency Use Authorization (EUA). This EUA will remain in effect (meaning this test can be used) for the duration of the COVID-19 declaration under Section 564(b)(1) of the Act, 21 U.S.C. section 360bbb-3(b)(1), unless the authorization is terminated or revoked.  Performed at [facility], [place] [address], [place], [place] [address]   Admission on 10/04/2021, Discharged on 10/06/2021  Component Date Value Ref Range Status   WBC 10/04/2021 7.3  4.0 - 10.5 K/uL Final   RBC 10/04/2021 4.19  3.87 - 5.11 MIL/uL Final   Hemoglobin 10/04/2021 10.9 (L)  12.0 - 15.0 g/dL Final   HCT 10/04/2021 33.9 (L)  36.0 - 46.0 % Final   MCV 10/04/2021 80.9  80.0 - 100.0 fL Final   MCH 10/04/2021 26.0  26.0 - 34.0 pg Final   MCHC 10/04/2021 32.2  30.0 - 36.0 g/dL Final   RDW 10/04/2021 13.2  11.5 - 15.5 % Final   Platelets 10/04/2021 124 (L)  150 - 400 K/uL Final   REPEATED TO VERIFY   nRBC 10/04/2021 0.0  0.0 - 0.2 % Final   Neutrophils Relative % 10/04/2021 63  % Final   Neutro Abs 10/04/2021 4.6  1.7 - 7.7 K/uL Final   Lymphocytes Relative 10/04/2021 31  % Final   Lymphs Abs 10/04/2021 2.3  0.7 - 4.0 K/uL Final   Monocytes Relative 10/04/2021 5  % Final   Monocytes Absolute 10/04/2021 0.3  0.1 - 1.0 K/uL Final   Eosinophils Relative 10/04/2021 0  % Final   Eosinophils  Absolute 10/04/2021 0.0  0.0 - 0.5 K/uL Final   Basophils Relative 10/04/2021 1  % Final   Basophils Absolute 10/04/2021 0.0  0.0 - 0.1 K/uL Final   Immature Granulocytes 10/04/2021 0  % Final   Abs Immature Granulocytes 10/04/2021 0.01  0.00 - 0.07 K/uL Final   Performed at [facility], [place] [address], [place], [place] [address]   Sodium 10/04/2021 137  135 - 145 mmol/L Final   Potassium 10/04/2021 4.1  3.5 - 5.1 mmol/L Final   Chloride 10/04/2021 107  98 - 111 mmol/L Final   CO2 10/04/2021 26  22 - 32 mmol/L Final   Glucose, Bld 10/04/2021 105 (H)  70 - 99 mg/dL Final   Glucose reference range applies only to samples taken after fasting for at least 8 hours.   BUN 10/04/2021 <5 (L)  6 - 20 mg/dL Final   Creatinine, Ser 10/04/2021 0.78  0.44 - 1.00 mg/dL Final   Calcium 10/04/2021 8.9  8.9 - 10.3 mg/dL Final   Total Protein 10/04/2021 6.2 (L)  6.5 - 8.1 g/dL Final   Albumin 10/04/2021 3.8  3.5 - 5.0 g/dL Final   AST 10/04/2021 22  15 - 41 U/L Final   ALT 10/04/2021 13  0 - 44 U/L Final   Alkaline Phosphatase 10/04/2021 61  38 - 126 U/L Final   Total Bilirubin 10/04/2021 0.6  0.3 - 1.2 mg/dL Final   GFR, Estimated 10/04/2021 >60  >60 mL/min Final  Comment: (NOTE) Calculated using the CKD-EPI Creatinine Equation (2021)    Anion gap 10/04/2021 4 (L)  5 - 15 Final   Performed at [facility], [place] [address] [place], [place] [address]   Acetaminophen (Tylenol), Serum 10/04/2021 <10 (L)  10 - 30 ug/mL Final   Comment: (NOTE) Therapeutic concentrations vary significantly. A range of 10-30 ug/mL  may be an effective concentration for many patients. However, some  are best treated at concentrations outside of this range. Acetaminophen concentrations >150 ug/mL at 4 hours after ingestion  and >50 ug/mL at 12 hours after ingestion are often associated with  toxic reactions.  Performed at [facility], [place] [address], [place], [place] [address]    Salicylate Lvl  62/95/2841 <7.0 (L)  7.0 - 30.0 mg/dL Final   Performed at [facility] [address], [place], [place] [address]   Opiates 10/04/2021 NONE DETECTED  NONE DETECTED Final   Cocaine 10/04/2021 NONE DETECTED  NONE DETECTED Final   Benzodiazepines 10/04/2021 NONE DETECTED  NONE DETECTED Final   Amphetamines 10/04/2021 NONE DETECTED  NONE DETECTED Final   Tetrahydrocannabinol 10/04/2021 NONE DETECTED  NONE DETECTED Final   Barbiturates 10/04/2021 NONE DETECTED  NONE DETECTED Final   Comment: (NOTE) DRUG SCREEN FOR MEDICAL PURPOSES ONLY.  IF CONFIRMATION IS NEEDED FOR ANY PURPOSE, NOTIFY LAB WITHIN 5 DAYS.  LOWEST DETECTABLE LIMITS FOR URINE DRUG SCREEN Drug Class                     Cutoff (ng/mL) Amphetamine and metabolites    1000 Barbiturate and metabolites    200 Benzodiazepine                 102 Tricyclics and metabolites     300 Opiates and metabolites        300 Cocaine and metabolites        300 THC                            50 Performed at [facility], [place] [address] [place], [place] [address]    Alcohol, Ethyl (B) 10/04/2021 <10  <10 mg/dL Final   Comment: (NOTE) Lowest detectable limit for serum alcohol is 10 mg/dL.  For medical purposes only. Performed at [facility], [place] [address] [place], [place] [address]    I-stat hCG, quantitative 10/05/2021 <5.0  <5 mIU/mL Final   Comment 3 10/05/2021          Final   Comment:   GEST. AGE      CONC.  (mIU/mL)   <=1 WEEK        5 - 50     2 WEEKS       50 - 500     3 WEEKS       100 - 10,000     4 WEEKS     1,000 - 30,000        FEMALE AND NON-PREGNANT FEMALE:     LESS THAN 5 mIU/mL    Acetaminophen (Tylenol), Serum 10/05/2021 <10 (L)  10 - 30 ug/mL Final   Comment: (NOTE) Therapeutic concentrations vary significantly. A range of 10-30 ug/mL  may be an effective concentration for many patients. However, some  are best treated at concentrations outside of this range. Acetaminophen concentrations  >150 ug/mL at 4 hours after ingestion  and >50 ug/mL at 12 hours  after ingestion are often associated with  toxic reactions.  Performed at [facility], [place] [address], [place], [place] [address]    SARS Coronavirus 2 by RT PCR 10/05/2021 NEGATIVE  NEGATIVE Final   Comment: (NOTE) SARS-CoV-2 target nucleic acids are NOT DETECTED.  The SARS-CoV-2 RNA is generally detectable in upper respiratory specimens during the acute phase of infection. The lowest concentration of SARS-CoV-2 viral copies this assay can detect is 138 copies/mL. A negative result does not preclude SARS-Cov-2 infection and should not be used as the sole basis for treatment or other patient management decisions. A negative result may occur with  improper specimen collection/handling, submission of specimen other than nasopharyngeal swab, presence of viral mutation(s) within the areas targeted by this assay, and inadequate number of viral copies(<138 copies/mL). A negative result must be combined with clinical observations, patient history, and epidemiological information. The expected result is Negative.  Fact Sheet for Patients:  EntrepreneurPulse.com.au  Fact Sheet for Healthcare Providers:  IncredibleEmployment.be  This test is no                          t yet approved or cleared by the [place] FDA and  has been authorized for detection and/or diagnosis of SARS-CoV-2 by FDA under an Emergency Use Authorization (EUA). This EUA will remain  in effect (meaning this test can be used) for the duration of the COVID-19 declaration under Section 564(b)(1) of the Act, 21 U.S.C.section 360bbb-3(b)(1), unless the authorization is terminated  or revoked sooner.       Influenza A by PCR 10/05/2021 NEGATIVE  NEGATIVE Final   Influenza B by PCR 10/05/2021 NEGATIVE  NEGATIVE Final   Comment: (NOTE) The Xpert Xpress SARS-CoV-2/FLU/RSV plus assay is intended as an aid in  the diagnosis of influenza from Nasopharyngeal swab specimens and should not be used as a sole basis for treatment. Nasal washings and aspirates are unacceptable for Xpert Xpress SARS-CoV-2/FLU/RSV testing.  Fact Sheet for Patients: EntrepreneurPulse.com.au  Fact Sheet for Healthcare Providers: IncredibleEmployment.be  This test is not yet approved or cleared by the [place] FDA and has been authorized for detection and/or diagnosis of SARS-CoV-2 by FDA under an Emergency Use Authorization (EUA). This EUA will remain in effect (meaning this test can be used) for the duration of the COVID-19 declaration under Section 564(b)(1) of the Act, 21 U.S.C. section 360bbb-3(b)(1), unless the authorization is terminated or revoked.  Performed at [facility], [place] [address] [place], [place] [address]   Admission on 09/02/2021, Discharged on 09/04/2021  Component Date Value Ref Range Status   SARS Coronavirus 2 by RT PCR 09/02/2021 NEGATIVE  NEGATIVE Final   Comment: (NOTE) SARS-CoV-2 target nucleic acids are NOT DETECTED.  The SARS-CoV-2 RNA is generally detectable in upper respiratory specimens during the acute phase of infection. The lowest concentration of SARS-CoV-2 viral copies this assay can detect is 138 copies/mL. A negative result does not preclude SARS-Cov-2 infection and should not be used as the sole basis for treatment or other patient management decisions. A negative result may occur with  improper specimen collection/handling, submission of specimen other than nasopharyngeal swab, presence of viral mutation(s) within the areas targeted by this assay, and inadequate number of viral copies(<138 copies/mL). A negative result must be combined with clinical observations, patient history, and epidemiological information. The expected result is Negative.  Fact Sheet for Patients:  EntrepreneurPulse.com.au  Fact  Sheet for Healthcare Providers:  IncredibleEmployment.be  This test is no                          t yet approved or cleared by the [place] and  has been authorized for detection and/or diagnosis of SARS-CoV-2 by FDA under an Emergency Use Authorization (EUA). This EUA will remain  in effect (meaning this test can be used) for the duration of the COVID-19 declaration under Section 564(b)(1) of the Act, 21 U.S.C.section 360bbb-3(b)(1), unless the authorization is terminated  or revoked sooner.       Influenza A by PCR 09/02/2021 NEGATIVE  NEGATIVE Final   Influenza B by PCR 09/02/2021 NEGATIVE  NEGATIVE Final   Comment: (NOTE) The Xpert Xpress SARS-CoV-2/FLU/RSV plus assay is intended as an aid in the diagnosis of influenza from Nasopharyngeal swab specimens and should not be used as a sole basis for treatment. Nasal washings and aspirates are unacceptable for Xpert Xpress SARS-CoV-2/FLU/RSV testing.  Fact Sheet for Patients: EntrepreneurPulse.com.au  Fact Sheet for Healthcare Providers: IncredibleEmployment.be  This test is not yet approved or cleared by the [place] FDA and has been authorized for detection and/or diagnosis of SARS-CoV-2 by FDA under an Emergency Use Authorization (EUA). This EUA will remain in effect (meaning this test can be used) for the duration of the COVID-19 declaration under Section 564(b)(1) of the Act, 21 U.S.C. section 360bbb-3(b)(1), unless the authorization is terminated or revoked.  Performed at [facility], [place] [address], [place], [place] [address]    WBC 09/02/2021 8.5  4.0 - 10.5 K/uL Final   RBC 09/02/2021 4.76  3.87 - 5.11 MIL/uL Final   Hemoglobin 09/02/2021 12.4  12.0 - 15.0 g/dL Final   HCT 09/02/2021 38.4  36.0 - 46.0 % Final   MCV 09/02/2021 80.7  80.0 - 100.0 fL Final   MCH 09/02/2021 26.1  26.0 - 34.0 pg Final   MCHC 09/02/2021 32.3  30.0 - 36.0 g/dL  Final   RDW 09/02/2021 13.2  11.5 - 15.5 % Final   Platelets 09/02/2021 295  150 - 400 K/uL Final   nRBC 09/02/2021 0.0  0.0 - 0.2 % Final   Neutrophils Relative % 09/02/2021 56  % Final   Neutro Abs 09/02/2021 4.8  1.7 - 7.7 K/uL Final   Lymphocytes Relative 09/02/2021 36  % Final   Lymphs Abs 09/02/2021 3.1  0.7 - 4.0 K/uL Final   Monocytes Relative 09/02/2021 6  % Final   Monocytes Absolute 09/02/2021 0.5  0.1 - 1.0 K/uL Final   Eosinophils Relative 09/02/2021 1  % Final   Eosinophils Absolute 09/02/2021 0.1  0.0 - 0.5 K/uL Final   Basophils Relative 09/02/2021 1  % Final   Basophils Absolute 09/02/2021 0.0  0.0 - 0.1 K/uL Final   Immature Granulocytes 09/02/2021 0  % Final   Abs Immature Granulocytes 09/02/2021 0.02  0.00 - 0.07 K/uL Final   Performed at [facility], [place] [address], [place], [place] [address]   Sodium 09/02/2021 137  135 - 145 mmol/L Final   Potassium 09/02/2021 3.6  3.5 - 5.1 mmol/L Final   Chloride 09/02/2021 102  98 - 111 mmol/L Final   CO2 09/02/2021 28  22 - 32 mmol/L Final   Glucose, Bld 09/02/2021 92  70 - 99 mg/dL Final   Glucose reference range applies only to samples taken after fasting for at least 8 hours.   BUN 09/02/2021 6  6 -  20 mg/dL Final   Creatinine, Ser 09/02/2021 0.72  0.44 - 1.00 mg/dL Final   Calcium 09/02/2021 10.1  8.9 - 10.3 mg/dL Final   Total Protein 09/02/2021 7.5  6.5 - 8.1 g/dL Final   Albumin 09/02/2021 4.2  3.5 - 5.0 g/dL Final   AST 09/02/2021 17  15 - 41 U/L Final   ALT 09/02/2021 16  0 - 44 U/L Final   Alkaline Phosphatase 09/02/2021 70  38 - 126 U/L Final   Total Bilirubin 09/02/2021 0.4  0.3 - 1.2 mg/dL Final   GFR, Estimated 09/02/2021 >60  >60 mL/min Final   Comment: (NOTE) Calculated using the CKD-EPI Creatinine Equation (2021)    Anion gap 09/02/2021 7  5 - 15 Final   Performed at [place] [address], [place], [place] [address]   Hgb A1c MFr Bld 09/02/2021 5.0  4.8 - 5.6 % Final   Comment:  (NOTE) Pre diabetes:          5.7%-6.4%  Diabetes:              >6.4%  Glycemic control for   <7.0% adults with diabetes    Mean Plasma Glucose 09/02/2021 96.8  mg/dL Final   Performed at [place] [address] [place], [place] [address]   TSH 09/02/2021 2.382  0.350 - 4.500 uIU/mL Final   Comment: Performed by a 3rd Generation assay with a functional sensitivity of <=0.01 uIU/mL. Performed at [facility], [place] [address], [place], [place] [address]    POC Amphetamine UR 09/02/2021 None Detected  NONE DETECTED (Cut Off Level 1000 ng/mL) Preliminary   POC Secobarbital (BAR) 09/02/2021 None Detected  NONE DETECTED (Cut Off Level 300 ng/mL) Preliminary   POC Buprenorphine (BUP) 09/02/2021 None Detected  NONE DETECTED (Cut Off Level 10 ng/mL) Preliminary   POC Oxazepam (BZO) 09/02/2021 None Detected  NONE DETECTED (Cut Off Level 300 ng/mL) Preliminary   POC Cocaine UR 09/02/2021 None Detected  NONE DETECTED (Cut Off Level 300 ng/mL) Preliminary   POC Methamphetamine UR 09/02/2021 None Detected  NONE DETECTED (Cut Off Level 1000 ng/mL) Preliminary   POC Morphine 09/02/2021 None Detected  NONE DETECTED (Cut Off Level 300 ng/mL) Preliminary   POC Methadone UR 09/02/2021 None Detected  NONE DETECTED (Cut Off Level 300 ng/mL) Preliminary   POC Oxycodone UR 09/02/2021 None Detected  NONE DETECTED (Cut Off Level 100 ng/mL) Preliminary   POC Marijuana UR 09/02/2021 None Detected  NONE DETECTED (Cut Off Level 50 ng/mL) Preliminary   SARSCOV2ONAVIRUS 2 AG 09/02/2021 NEGATIVE  NEGATIVE Final   Comment: (NOTE) SARS-CoV-2 antigen NOT DETECTED.   Negative results are presumptive.  Negative results do not preclude SARS-CoV-2 infection and should not be used as the sole basis for treatment or other patient management decisions, including infection  control decisions, particularly in the presence of clinical signs and  symptoms consistent with COVID-19, or in those who have been  in contact with the virus.  Negative results must be combined with clinical observations, patient history, and epidemiological information. The expected result is Negative.  Fact Sheet for Patients: HandmadeRecipes.com.cy  Fact Sheet for Healthcare Providers: FuneralLife.at  This test is not yet approved or cleared by the [place] FDA and  has been authorized for detection and/or diagnosis of SARS-CoV-2 by FDA under an Emergency Use Authorization (EUA).  This EUA will remain in effect (meaning this test can be used) for the duration of  the COV  ID-19 declaration under Section 564(b)(1) of the Act, 21 U.S.C. section 360bbb-3(b)(1), unless the authorization is terminated or revoked sooner.     Preg Test, Ur 09/02/2021 NEGATIVE  NEGATIVE Final   Comment:        THE SENSITIVITY OF THIS METHODOLOGY IS >24 mIU/mL   Admission on 08/02/2021, Discharged on 08/03/2021  Component Date Value Ref Range Status   SARS Coronavirus 2 by RT PCR 08/03/2021 NEGATIVE  NEGATIVE Final   Comment: (NOTE) SARS-CoV-2 target nucleic acids are NOT DETECTED.  The SARS-CoV-2 RNA is generally detectable in upper respiratory specimens during the acute phase of infection. The lowest concentration of SARS-CoV-2 viral copies this assay can detect is 138 copies/mL. A negative result does not preclude SARS-Cov-2 infection and should not be used as the sole basis for treatment or other patient management decisions. A negative result may occur with  improper specimen collection/handling, submission of specimen other than nasopharyngeal swab, presence of viral mutation(s) within the areas targeted by this assay, and inadequate number of viral copies(<138 copies/mL). A negative result must be combined with clinical observations, patient history, and epidemiological information. The expected result is Negative.  Fact Sheet for Patients:   EntrepreneurPulse.com.au  Fact Sheet for Healthcare Providers:  IncredibleEmployment.be  This test is no                          t yet approved or cleared by the [place] FDA and  has been authorized for detection and/or diagnosis of SARS-CoV-2 by FDA under an Emergency Use Authorization (EUA). This EUA will remain  in effect (meaning this test can be used) for the duration of the COVID-19 declaration under Section 564(b)(1) of the Act, 21 U.S.C.section 360bbb-3(b)(1), unless the authorization is terminated  or revoked sooner.       Influenza A by PCR 08/03/2021 NEGATIVE  NEGATIVE Final   Influenza B by PCR 08/03/2021 NEGATIVE  NEGATIVE Final   Comment: (NOTE) The Xpert Xpress SARS-CoV-2/FLU/RSV plus assay is intended as an aid in the diagnosis of influenza from Nasopharyngeal swab specimens and should not be used as a sole basis for treatment. Nasal washings and aspirates are unacceptable for Xpert Xpress SARS-CoV-2/FLU/RSV testing.  Fact Sheet for Patients: EntrepreneurPulse.com.au  Fact Sheet for Healthcare Providers: IncredibleEmployment.be  This test is not yet approved or cleared by the [place] FDA and has been authorized for detection and/or diagnosis of SARS-CoV-2 by FDA under an Emergency Use Authorization (EUA). This EUA will remain in effect (meaning this test can be used) for the duration of the COVID-19 declaration under Section 564(b)(1) of the Act, 21 U.S.C. section 360bbb-3(b)(1), unless the authorization is terminated or revoked.  Performed at [facility], [place] [address], [place], [place] [address]    WBC 08/02/2021 9.1  4.0 - 10.5 K/uL Final   RBC 08/02/2021 4.82  3.87 - 5.11 MIL/uL Final   Hemoglobin 08/02/2021 12.8  12.0 - 15.0 g/dL Final   HCT 08/02/2021 38.8  36.0 - 46.0 % Final   MCV 08/02/2021 80.5  80.0 - 100.0 fL Final   MCH 08/02/2021 26.6  26.0 - 34.0  pg Final   MCHC 08/02/2021 33.0  30.0 - 36.0 g/dL Final   RDW 08/02/2021 13.9  11.5 - 15.5 % Final   Platelets 08/02/2021 345  150 - 400 K/uL Final   nRBC 08/02/2021 0.0  0.0 - 0.2 % Final   Neutrophils Relative % 08/02/2021 64  % Final  Neutro Abs 08/02/2021 5.7  1.7 - 7.7 K/uL Final   Lymphocytes Relative 08/02/2021 31  % Final   Lymphs Abs 08/02/2021 2.8  0.7 - 4.0 K/uL Final   Monocytes Relative 08/02/2021 5  % Final   Monocytes Absolute 08/02/2021 0.5  0.1 - 1.0 K/uL Final   Eosinophils Relative 08/02/2021 0  % Final   Eosinophils Absolute 08/02/2021 0.0  0.0 - 0.5 K/uL Final   Basophils Relative 08/02/2021 0  % Final   Basophils Absolute 08/02/2021 0.0  0.0 - 0.1 K/uL Final   Immature Granulocytes 08/02/2021 0  % Final   Abs Immature Granulocytes 08/02/2021 0.03  0.00 - 0.07 K/uL Final   Performed at [facility], [place] [address] [address], [place], [place] [address]   Sodium 08/02/2021 138  135 - 145 mmol/L Final   Potassium 08/02/2021 3.8  3.5 - 5.1 mmol/L Final   Chloride 08/02/2021 104  98 - 111 mmol/L Final   CO2 08/02/2021 23  22 - 32 mmol/L Final   Glucose, Bld 08/02/2021 90  70 - 99 mg/dL Final   Glucose reference range applies only to samples taken after fasting for at least 8 hours.   BUN 08/02/2021 15  6 - 20 mg/dL Final   Creatinine, Ser 08/02/2021 0.75  0.44 - 1.00 mg/dL Final   Calcium 08/02/2021 10.0  8.9 - 10.3 mg/dL Final   Total Protein 08/02/2021 7.1  6.5 - 8.1 g/dL Final   Albumin 08/02/2021 4.3  3.5 - 5.0 g/dL Final   AST 08/02/2021 21  15 - 41 U/L Final   ALT 08/02/2021 20  0 - 44 U/L Final   Alkaline Phosphatase 08/02/2021 74  38 - 126 U/L Final   Total Bilirubin 08/02/2021 0.5  0.3 - 1.2 mg/dL Final   GFR, Estimated 08/02/2021 >60  >60 mL/min Final   Comment: (NOTE) Calculated using the CKD-EPI Creatinine Equation (2021)    Anion gap 08/02/2021 11  5 - 15 Final   Performed at [facility] [address], [place], [place] [address]   Alcohol,  Ethyl (B) 08/02/2021 <10  <10 mg/dL Final   Comment: (NOTE) Lowest detectable limit for serum alcohol is 10 mg/dL.  For medical purposes only. Performed at [facility], [place] [address] [place], [place] [address]    POC Amphetamine UR 08/03/2021 Positive (A)  NONE DETECTED (Cut Off Level 1000 ng/mL) Final   POC Secobarbital (BAR) 08/03/2021 None Detected  NONE DETECTED (Cut Off Level 300 ng/mL) Final   POC Buprenorphine (BUP) 08/03/2021 None Detected  NONE DETECTED (Cut Off Level 10 ng/mL) Final   POC Oxazepam (BZO) 08/03/2021 Positive (A)  NONE DETECTED (Cut Off Level 300 ng/mL) Final   POC Cocaine UR 08/03/2021 None Detected  NONE DETECTED (Cut Off Level 300 ng/mL) Final   POC Methamphetamine UR 08/03/2021 None Detected  NONE DETECTED (Cut Off Level 1000 ng/mL) Final   POC Morphine 08/03/2021 None Detected  NONE DETECTED (Cut Off Level 300 ng/mL) Final   POC Methadone UR 08/03/2021 None Detected  NONE DETECTED (Cut Off Level 300 ng/mL) Final   POC Oxycodone UR 08/03/2021 None Detected  NONE DETECTED (Cut Off Level 100 ng/mL) Final   POC Marijuana UR 08/03/2021 None Detected  NONE DETECTED (Cut Off Level 50 ng/mL) Final   SARS Coronavirus 2 Ag 08/03/2021 Negative  Negative Preliminary   SARSCOV2ONAVIRUS 2 AG 08/03/2021 NEGATIVE  NEGATIVE Final   Comment: (NOTE) SARS-CoV-2 antigen NOT DETECTED.   Negative  results are presumptive.  Negative results do not preclude SARS-CoV-2 infection and should not be used as the sole basis for treatment or other patient management decisions, including infection  control decisions, particularly in the presence of clinical signs and  symptoms consistent with COVID-19, or in those who have been in contact with the virus.  Negative results must be combined with clinical observations, patient history, and epidemiological information. The expected result is Negative.  Fact Sheet for Patients: HandmadeRecipes.com.cy  Fact Sheet  for Healthcare Providers: FuneralLife.at  This test is not yet approved or cleared by the [place] FDA and  has been authorized for detection and/or diagnosis of SARS-CoV-2 by FDA under an Emergency Use Authorization (EUA).  This EUA will remain in effect (meaning this test can be used) for the duration of  the COV                          ID-19 declaration under Section 564(b)(1) of the Act, 21 U.S.C. section 360bbb-3(b)(1), unless the authorization is terminated or revoked sooner.     Preg Test, Ur 08/03/2021 NEGATIVE  NEGATIVE Final   Comment:        THE SENSITIVITY OF THIS METHODOLOGY IS >24 mIU/mL   Admission on 08/01/2021, Discharged on 08/01/2021  Component Date Value Ref Range Status   Sodium 08/01/2021 137  135 - 145 mmol/L Final   Potassium 08/01/2021 3.7  3.5 - 5.1 mmol/L Final   Chloride 08/01/2021 107  98 - 111 mmol/L Final   CO2 08/01/2021 20 (L)  22 - 32 mmol/L Final   Glucose, Bld 08/01/2021 92  70 - 99 mg/dL Final   Glucose reference range applies only to samples taken after fasting for at least 8 hours.   BUN 08/01/2021 13  6 - 20 mg/dL Final   Creatinine, Ser 08/01/2021 0.63  0.44 - 1.00 mg/dL Final   Calcium 08/01/2021 9.3  8.9 - 10.3 mg/dL Final   GFR, Estimated 08/01/2021 >60  >60 mL/min Final   Comment: (NOTE) Calculated using the CKD-EPI Creatinine Equation (2021)    Anion gap 08/01/2021 10  5 - 15 Final   Performed at [facility], [place] [address], [place], [place] [address]   WBC 08/01/2021 8.0  4.0 - 10.5 K/uL Final   RBC 08/01/2021 4.84  3.87 - 5.11 MIL/uL Final   Hemoglobin 08/01/2021 12.4  12.0 - 15.0 g/dL Final   HCT 08/01/2021 40.1  36.0 - 46.0 % Final   MCV 08/01/2021 82.9  80.0 - 100.0 fL Final   MCH 08/01/2021 25.6 (L)  26.0 - 34.0 pg Final   MCHC 08/01/2021 30.9  30.0 - 36.0 g/dL Final   RDW 08/01/2021 14.0  11.5 - 15.5 % Final   Platelets 08/01/2021 305  150 - 400 K/uL Final   nRBC 08/01/2021 0.0   0.0 - 0.2 % Final   Performed at [facility] [address] [place], [place] [address]   I-stat hCG, quantitative 08/01/2021 <5.0  <5 mIU/mL Final   Comment 3 08/01/2021          Final   Comment:   GEST. AGE      CONC.  (mIU/mL)   <=1 WEEK        5 - 50     2 WEEKS       50 - 500     3 WEEKS       100 - 10,000  4 WEEKS     1,000 - 30,000        FEMALE AND NON-PREGNANT FEMALE:     LESS THAN 5 mIU/mL    Troponin I (High Sensitivity) 08/01/2021 2  <18 ng/L Final   Comment: (NOTE) Elevated high sensitivity troponin I (hsTnI) values and significant  changes across serial measurements may suggest ACS but many other  chronic and acute conditions are known to elevate hsTnI results.  Refer to the "Links" section for chest pain algorithms and additional  guidance. Performed at [facility], [place] [address], [place], [place] [address]    Color, Urine 08/01/2021 YELLOW  YELLOW Final   APPearance 08/01/2021 HAZY (A)  CLEAR Final   Specific Gravity, Urine 08/01/2021 1.018  1.005 - 1.030 Final   pH 08/01/2021 5.0  5.0 - 8.0 Final   Glucose, UA 08/01/2021 NEGATIVE  NEGATIVE mg/dL Final   Hgb urine dipstick 08/01/2021 NEGATIVE  NEGATIVE Final   Bilirubin Urine 08/01/2021 NEGATIVE  NEGATIVE Final   Ketones, ur 08/01/2021 NEGATIVE  NEGATIVE mg/dL Final   Protein, ur 08/01/2021 NEGATIVE  NEGATIVE mg/dL Final   Nitrite 08/01/2021 NEGATIVE  NEGATIVE Final   Leukocytes,Ua 08/01/2021 NEGATIVE  NEGATIVE Final   Performed at [facility], [place] [address] [place], [place] [address]  Admission on 07/30/2021, Discharged on 07/31/2021  Component Date Value Ref Range Status   Preg Test, Ur 07/30/2021 NEGATIVE  NEGATIVE Final   Comment:        THE SENSITIVITY OF THIS METHODOLOGY IS >24 mIU/mL   Admission on 07/29/2021, Discharged on 07/30/2021  Component Date Value Ref Range Status   Glucose-Capillary 07/29/2021 90  70 - 99 mg/dL Final   Glucose reference range applies only to  samples taken after fasting for at least 8 hours.   Sodium 07/29/2021 140  135 - 145 mmol/L Final   Potassium 07/29/2021 3.9  3.5 - 5.1 mmol/L Final   Chloride 07/29/2021 112 (H)  98 - 111 mmol/L Final   CO2 07/29/2021 18 (L)  22 - 32 mmol/L Final   Glucose, Bld 07/29/2021 94  70 - 99 mg/dL Final   Glucose reference range applies only to samples taken after fasting for at least 8 hours.   BUN 07/29/2021 10  6 - 20 mg/dL Final   Creatinine, Ser 07/29/2021 0.68  0.44 - 1.00 mg/dL Final   Calcium 07/29/2021 8.4 (L)  8.9 - 10.3 mg/dL Final   Total Protein 07/29/2021 5.8 (L)  6.5 - 8.1 g/dL Final   Albumin 07/29/2021 3.3 (L)  3.5 - 5.0 g/dL Final   AST 07/29/2021 17  15 - 41 U/L Final   ALT 07/29/2021 18  0 - 44 U/L Final   Alkaline Phosphatase 07/29/2021 57  38 - 126 U/L Final   Total Bilirubin 07/29/2021 0.4  0.3 - 1.2 mg/dL Final   GFR, Estimated 07/29/2021 >60  >60 mL/min Final   Comment: (NOTE) Calculated using the CKD-EPI Creatinine Equation (2021)    Anion gap 07/29/2021 10  5 - 15 Final   Performed at [facility], [place] [address] [place], [place] [address]   Salicylate Lvl 63/14/9702 <7.0 (L)  7.0 - 30.0 mg/dL Final   Performed at [facility] [address] [place], [place] [address]   Acetaminophen (Tylenol), Serum 07/29/2021 <10 (L)  10 - 30 ug/mL Final   Comment: (NOTE) Therapeutic concentrations vary significantly. A range of 10-30 ug/mL  may be an effective concentration  for many patients. However, some  are best treated at concentrations outside of this range. Acetaminophen concentrations >150 ug/mL at 4 hours after ingestion  and >50 ug/mL at 12 hours after ingestion are often associated with  toxic reactions.  Performed at [facility], [place] [address], [place], [place] [address]    Alcohol, Ethyl (B) 07/29/2021 <10  <10 mg/dL Final   Comment: (NOTE) Lowest detectable limit for serum alcohol is 10 mg/dL.  For medical purposes only. Performed at  [facility], [place] [address] [place], [place] [address]    Opiates 07/29/2021 NONE DETECTED  NONE DETECTED Final   Cocaine 07/29/2021 NONE DETECTED  NONE DETECTED Final   Benzodiazepines 07/29/2021 NONE DETECTED  NONE DETECTED Final   Amphetamines 07/29/2021 NONE DETECTED  NONE DETECTED Final   Tetrahydrocannabinol 07/29/2021 NONE DETECTED  NONE DETECTED Final   Barbiturates 07/29/2021 NONE DETECTED  NONE DETECTED Final   Comment: (NOTE) DRUG SCREEN FOR MEDICAL PURPOSES ONLY.  IF CONFIRMATION IS NEEDED FOR ANY PURPOSE, NOTIFY LAB WITHIN 5 DAYS.  LOWEST DETECTABLE LIMITS FOR URINE DRUG SCREEN Drug Class                     Cutoff (ng/mL) Amphetamine and metabolites    1000 Barbiturate and metabolites    200 Benzodiazepine                 053 Tricyclics and metabolites     300 Opiates and metabolites        300 Cocaine and metabolites        300 THC                            50 Performed at [facility], [place] [address] [place], [place] [address]    WBC 07/29/2021 6.7  4.0 - 10.5 K/uL Final   RBC 07/29/2021 4.13  3.87 - 5.11 MIL/uL Final   Hemoglobin 07/29/2021 10.8 (L)  12.0 - 15.0 g/dL Final   HCT 07/29/2021 34.7 (L)  36.0 - 46.0 % Final   MCV 07/29/2021 84.0  80.0 - 100.0 fL Final   MCH 07/29/2021 26.2  26.0 - 34.0 pg Final   MCHC 07/29/2021 31.1  30.0 - 36.0 g/dL Final   RDW 07/29/2021 14.3  11.5 - 15.5 % Final   Platelets 07/29/2021 236  150 - 400 K/uL Final   nRBC 07/29/2021 0.0  0.0 - 0.2 % Final   Neutrophils Relative % 07/29/2021 61  % Final   Neutro Abs 07/29/2021 4.1  1.7 - 7.7 K/uL Final   Lymphocytes Relative 07/29/2021 33  % Final   Lymphs Abs 07/29/2021 2.2  0.7 - 4.0 K/uL Final   Monocytes Relative 07/29/2021 5  % Final   Monocytes Absolute 07/29/2021 0.3  0.1 - 1.0 K/uL Final   Eosinophils Relative 07/29/2021 1  % Final   Eosinophils Absolute 07/29/2021 0.1  0.0 - 0.5 K/uL Final   Basophils Relative 07/29/2021 0  % Final   Basophils Absolute  07/29/2021 0.0  0.0 - 0.1 K/uL Final   Immature Granulocytes 07/29/2021 0  % Final   Abs Immature Granulocytes 07/29/2021 0.02  0.00 - 0.07 K/uL Final   Performed at [facility], [place] [address] [place], [place] [address]   Color, Urine 07/29/2021 YELLOW  YELLOW Final   APPearance 07/29/2021 CLOUDY (A)  CLEAR Final   Specific Gravity, Urine 07/29/2021 1.005  1.005 - 1.030 Final   pH 07/29/2021 6.0  5.0 - 8.0 Final   Glucose, UA 07/29/2021 NEGATIVE  NEGATIVE mg/dL Final   Hgb urine dipstick 07/29/2021 NEGATIVE  NEGATIVE Final   Bilirubin Urine 07/29/2021 NEGATIVE  NEGATIVE Final   Ketones, ur 07/29/2021 NEGATIVE  NEGATIVE mg/dL Final   Protein, ur 07/29/2021 NEGATIVE  NEGATIVE mg/dL Final   Nitrite 07/29/2021 NEGATIVE  NEGATIVE Final   Leukocytes,Ua 07/29/2021 MODERATE (A)  NEGATIVE Final   RBC / HPF 07/29/2021 6-10  0 - 5 RBC/hpf Final   WBC, UA 07/29/2021 6-10  0 - 5 WBC/hpf Final   Bacteria, UA 07/29/2021 MANY (A)  NONE SEEN Final   Squamous Epithelial / LPF 07/29/2021 6-10  0 - 5 Final   Mucus 07/29/2021 PRESENT   Final   Amorphous Crystal 07/29/2021 PRESENT   Final   Performed at [facility], [place] [address], [place], [place] [address]   SARS Coronavirus 2 by RT PCR 07/30/2021 NEGATIVE  NEGATIVE Final   Comment: (NOTE) SARS-CoV-2 target nucleic acids are NOT DETECTED.  The SARS-CoV-2 RNA is generally detectable in upper respiratory specimens during the acute phase of infection. The lowest concentration of SARS-CoV-2 viral copies this assay can detect is 138 copies/mL. A negative result does not preclude SARS-Cov-2 infection and should not be used as the sole basis for treatment or other patient management decisions. A negative result may occur with  improper specimen collection/handling, submission of specimen other than nasopharyngeal swab, presence of viral mutation(s) within the areas targeted by this assay, and inadequate number of viral copies(<138 copies/mL).  A negative result must be combined with clinical observations, patient history, and epidemiological information. The expected result is Negative.  Fact Sheet for Patients:  EntrepreneurPulse.com.au  Fact Sheet for Healthcare Providers:  IncredibleEmployment.be  This test is no                          t yet approved or cleared by the [place] FDA and  has been authorized for detection and/or diagnosis of SARS-CoV-2 by FDA under an Emergency Use Authorization (EUA). This EUA will remain  in effect (meaning this test can be used) for the duration of the COVID-19 declaration under Section 564(b)(1) of the Act, 21 U.S.C.section 360bbb-3(b)(1), unless the authorization is terminated  or revoked sooner.       Influenza A by PCR 07/30/2021 NEGATIVE  NEGATIVE Final   Influenza B by PCR 07/30/2021 NEGATIVE  NEGATIVE Final   Comment: (NOTE) The Xpert Xpress SARS-CoV-2/FLU/RSV plus assay is intended as an aid in the diagnosis of influenza from Nasopharyngeal swab specimens and should not be used as a sole basis for treatment. Nasal washings and aspirates are unacceptable for Xpert Xpress SARS-CoV-2/FLU/RSV testing.  Fact Sheet for Patients: EntrepreneurPulse.com.au  Fact Sheet for Healthcare Providers: IncredibleEmployment.be  This test is not yet approved or cleared by the [place] FDA and has been authorized for detection and/or diagnosis of SARS-CoV-2 by FDA under an Emergency Use Authorization (EUA). This EUA will remain in effect (meaning this test can be used) for the duration of the COVID-19 declaration under Section 564(b)(1) of the Act, 21 U.S.C. section 360bbb-3(b)(1), unless the authorization is terminated or revoked.  Performed at [facility], [place] [address], [place], [place] [address]   There may be more visits with results that are not included.    Allergies: Bee venom, Coconut  flavor, Fish allergy, Geodon [ziprasidone  hcl], Haloperidol and related, Lithobid [lithium], Roxicodone [oxycodone], Seroquel [quetiapine], Shellfish allergy, Phenergan [promethazine hcl], Prilosec [omeprazole], Sulfa antibiotics, Tegretol [carbamazepine], Prozac [fluoxetine], Tape, and Tylenol [acetaminophen]  PTA Medications: (Not in a hospital admission)   Medical Decision Making  Inpatient observation   Lab Orders         Resp Panel by RT-PCR (Flu A&B, Covid) Anterior Nasal Swab         Pregnancy, urine         POCT Urine Drug Screen - (I-Screen)         POC SARS Coronavirus 2 Ag         Pregnancy, urine POC      Meds ordered this encounter  Medications   alum & mag hydroxide-simeth (MAALOX/MYLANTA) 200-200-20 MG/5ML suspension 30 mL   magnesium hydroxide (MILK OF MAGNESIA) suspension 30 mL   calcium carbonate (TUMS - dosed in mg elemental calcium) chewable tablet 1,500 mg   cariprazine (VRAYLAR) capsule 3 mg   cyclobenzaprine (FLEXERIL) tablet 10 mg   escitalopram (LEXAPRO) tablet 15 mg   ferrous sulfate tablet 325 mg   neomycin-bacitracin-polymyxin (NEOSPORIN) ointment 1 Application   pantoprazole (PROTONIX) EC tablet 40 mg   prazosin (MINIPRESS) capsule 1 mg   pregabalin (LYRICA) capsule 50 mg   propranolol (INDERAL) tablet 10 mg   QUEtiapine (SEROQUEL) tablet 25 mg   QUEtiapine (SEROQUEL) tablet 12.5 mg   QUEtiapine (SEROQUEL) tablet 12.5 mg     Recommendations  Based on my evaluation the patient does not appear to have an emergency medical condition.  [name], NP 10/26/21  5:40 AM

## 2021-10-25 NOTE — ED Notes (Signed)
Pt A&O x 4, presents with suicidal ideations, plan to run through glass window.  Scratch marks noted to bilateral arms.  MOnitoring for safety.

## 2021-10-26 DIAGNOSIS — R45851 Suicidal ideations: Secondary | ICD-10-CM | POA: Diagnosis not present

## 2021-10-26 DIAGNOSIS — F250 Schizoaffective disorder, bipolar type: Secondary | ICD-10-CM | POA: Diagnosis not present

## 2021-10-26 LAB — POCT URINE DRUG SCREEN - MANUAL ENTRY (I-SCREEN)
POC Amphetamine UR: NOT DETECTED
POC Buprenorphine (BUP): NOT DETECTED
POC Cocaine UR: NOT DETECTED
POC Marijuana UR: NOT DETECTED
POC Methadone UR: NOT DETECTED
POC Methamphetamine UR: NOT DETECTED
POC Morphine: NOT DETECTED
POC Oxazepam (BZO): NOT DETECTED
POC Oxycodone UR: NOT DETECTED
POC Secobarbital (BAR): NOT DETECTED

## 2021-10-26 LAB — POCT PREGNANCY, URINE: Preg Test, Ur: NEGATIVE

## 2021-10-26 NOTE — Discharge Instructions (Addendum)
Please follow up with your CST team for medication management and counseling.

## 2021-10-26 NOTE — ED Provider Notes (Signed)
FBC/OBS ASAP Discharge Summary  Date and Time: 10/26/2021 2:26 PM  Name: [name]  MRN:  [id number]   Discharge Diagnoses:  Final diagnoses:  Suicidal ideation  Schizoaffective disorder, bipolar type ([place])   Subjective:   On my approach, pt is standing by nursing area on unit, agrees to go to assessment room with me. Pt is familiar to me from prior visits, most recently saw pt on 10/18/21, when she was transferred from this facility to [facility] for inpatient psychiatric hospitalization. Pt was at [facility] from 10/18/21-10/23/21. She is well known to the psychiatric service line will multiple presentations to the ED/UC and inpatient psychiatric hospitalizations. I ask pt reason for presenting to facility and she tells me "just had a rough day". She states yesterday she was let go from her job. She states after sleeping she is feeling much better and endorses euthymic mood. She states she has several upcoming job interviews tomorrow including at [employer] center, [employer], and [employer]. 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 She tells me she can keep herself safe if discharged today. She is able to verbally contract to safety for herself and others. She states she is focused on her health. She states she has upcoming medical appointments for cardiology, ob/gyn, and dentistry next week. She denies SI/VI/HI, AVH, paranoia. She denies any plan or intent to act on a plan. She reminds me that she is treatment seeking and will call 911/EMS, go to the nearest crisis center, or go to the emergency room if she experiences worsening suicidal ideation. She has a CST team and she says she talks to them "all the time, every day". She says she is going to call them upon discharge from facility. Her next visit with CST is on Monday and she is looking forward to seeing them. She states she has enough medication until her next  visit with CST team.  Stay Summary:  Pt is a 31 y/o female presenting to [facility] on 10/25/21 with complaint of suicidal ideation with plans to cut her wrist and run through her glass window. She is well known to the psychiatric service line with multiple presentations to the ED/UC and  inpatient psychiatric hospitalizations. Most recent psychiatric hospitalization was at [facility] from 10/18/21-10/23/21. On reassessment, pt denies SI/VI/HI, AVH, paranoia. She is able to verbally contract to safety. She is forward thinking. She will follow up with her CST team.  Total Time spent with patient: 30 minutes  Past Psychiatric History:  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 fragile X syndrome    Chronic constipation    Depression    Drug-seeking behavior    Essential tremor    Headache    Overdose of acetaminophen 07/2017   and other meds   Personality disorder ([place])    Schizo-affective psychosis ([place])    Schizoaffective disorder, bipolar type ([place])    Seizures ([place])    Last seizure December 2017   Sleep apnea     Past Surgical History:  Procedure Laterality Date   MOUTH SURGERY  2009 or 2010   Family History:  Family History  Problem Relation Age of Onset   Mental illness Father    Asthma Father  PDD Brother    Seizures Brother    Family Psychiatric History:  Social History:  Social History   Substance and Sexual Activity  Alcohol Use No   Alcohol/week: 1.0 standard drink of alcohol   Types: 1 Standard drinks or equivalent per week   Comment: denies at this time     Social History   Substance and Sexual Activity  Drug Use No   Comment: History of cocaine use at age 42 for 4 months    Social History   Socioeconomic History   Marital status: Widowed    Spouse name: Not on file   Number of children: 0   Years of education: Not on file   Highest education  level: Not on file  Occupational History   Occupation: disability  Tobacco Use   Smoking status: Former    Packs/day: 0.00    Types: Cigarettes   Smokeless tobacco: Never   Tobacco comments:    Smoked for 2  years age [age]  Vaping Use   Vaping Use: Never used  Substance and Sexual Activity   Alcohol use: No    Alcohol/week: 1.0 standard drink of alcohol    Types: 1 Standard drinks or equivalent per week    Comment: denies at this time   Drug use: No    Comment: History of cocaine use at age 58 for 4 months   Sexual activity: Not Currently    Birth control/protection: None  Other Topics Concern   Not on file  Social History Narrative   Marital status: Widowed      Children: daughter      Lives: with boyfriend, in two story home      Employment:  Disability      Tobacco: quit smoking; smoked for two years.      Alcohol ;none      Drugs: none   Has not traveled outside of the country.   Right handed         Social Determinants of Health   Financial Resource Strain: Not on file  Food Insecurity: No Food Insecurity (10/18/2021)   Hunger Vital Sign    Worried About Running Out of Food in the Last Year: Never true    Ran Out of Food in the Last Year: Never true  Transportation Needs: No Transportation Needs (10/18/2021)   PRAPARE - Hydrologist (Medical): No    Lack of Transportation (Non-Medical): No  Physical Activity: Not on file  Stress: Not on file  Social Connections: Not on file   SDOH:  [place]: No Food Insecurity (10/18/2021)  Housing: Medium Risk (10/18/2021)  Transportation Needs: No Transportation Needs (10/18/2021)  Utilities: Not At Risk (10/18/2021)  Alcohol Screen: Low Risk  (10/18/2021)  Depression (PHQ2-9): Medium Risk (06/27/2021)  Tobacco Use: Medium Risk (10/18/2021)    Tobacco Cessation:    Current Medications:  Current Facility-Administered Medications  Medication Dose Route Frequency  Provider Last Rate Last Admin   alum & mag hydroxide-simeth (MAALOX/MYLANTA) 200-200-20 MG/5ML suspension 30 mL  30 mL Oral Q4H PRN [name], NP       calcium carbonate (TUMS - dosed in mg elemental calcium) chewable tablet 1,500 mg  1,500 mg Oral BID PRN [name], NP       cariprazine (VRAYLAR) capsule 3 mg  3 mg Oral Daily [name], NP   3 mg at 10/26/21 0948   cyclobenzaprine (FLEXERIL) tablet 10 mg  10 mg  Oral Dorie Rank, NP   10 mg at 10/25/21 2131   escitalopram (LEXAPRO) tablet 15 mg  15 mg Oral Daily [name], NP   15 mg at 10/26/21 2952   ferrous sulfate tablet 325 mg  325 mg Oral Dorothea Ogle, NP       magnesium hydroxide (MILK OF MAGNESIA) suspension 30 mL  30 mL Oral Daily PRN [name], NP   30 mL at 10/25/21 2138   Neosporin Original [id number] OINT 1 Application  1 Application Topical BID [name], NP   1 Application at 02/72/53 0950   pantoprazole (PROTONIX) EC tablet 40 mg  40 mg Oral Daily [name], NP   40 mg at 10/26/21 0948   prazosin (MINIPRESS) capsule 1 mg  1 mg Oral QHS [name], NP   1 mg at 10/25/21 2132   pregabalin (LYRICA) capsule 50 mg  50 mg Oral Q8H [name], NP   50 mg at 10/26/21 0550   propranolol (INDERAL) tablet 10 mg  10 mg Oral BID [name], NP   10 mg at 10/26/21 0948   QUEtiapine (SEROQUEL) tablet 12.5 mg  12.5 mg Oral q morning [name], NP   12.5 mg at 10/26/21 0951   QUEtiapine (SEROQUEL) tablet 12.5 mg  12.5 mg Oral BID PRN [name], NP       QUEtiapine (SEROQUEL) tablet 25 mg  25 mg Oral QHS [name], NP   25 mg at 10/25/21 2131   Current Outpatient Medications  Medication Sig Dispense Refill   albuterol (VENTOLIN HFA) 108 (90 Base) MCG/ACT inhaler Inhale 2 puffs into the lungs every 6 (six) hours as needed for wheezing or shortness of breath.     budesonide-formoterol (SYMBICORT) 160-4.5 MCG/ACT inhaler Inhale 2 puffs into the lungs 2 (two) times daily.     calcium  carbonate (TUMS EX) 750 MG chewable tablet Chew 1,500 mg by mouth 2 (two) times daily as needed for heartburn.     cariprazine (VRAYLAR) 3 MG capsule Take 1 capsule (3 mg total) by mouth daily. 30 capsule    cyclobenzaprine (FLEXERIL) 10 MG tablet Take 10 mg by mouth at bedtime.     diclofenac Sodium (VOLTAREN) 1 % GEL Apply 2 g topically 4 (four) times daily. 100 g 0   escitalopram (LEXAPRO) 5 MG tablet Take 3 tablets (15 mg total) by mouth daily. 30 tablet 0   ferrous sulfate 325 (65 FE) MG tablet Take 1 tablet (325 mg total) by mouth every other day. 15 tablet 0   hydrOXYzine (ATARAX) 25 MG tablet Take 25 mg by mouth 3 (three) times daily as needed for anxiety.     Lidocaine-Menthol (ICY HOT MAX LIDOCAINE EX) Apply 1 Application topically daily as needed (pain).     Multiple Vitamins-Minerals (EMERGEN-C IMMUNE PO) Take 1 packet by mouth daily as needed (Immune support).     neomycin-bacitracin-polymyxin (NEOSPORIN) OINT Apply 1 Application topically 2 (two) times daily.     pantoprazole (PROTONIX) 40 MG tablet TAKE 1 TABLET BY MOUTH DAILY (Patient taking differently: 40 mg daily.) 30 tablet 0   prazosin (MINIPRESS) 1 MG capsule Take 1 capsule (1 mg total) by mouth at bedtime for 7 days. 7 capsule 0   pregabalin (LYRICA) 50 MG capsule Take 1 capsule (50 mg total) by mouth every 8 (eight) hours for 14 days. 42 capsule 0   propranolol (INDERAL) 10 MG tablet Take 1 tablet (10 mg total) by mouth 2 (two) times daily.  QUEtiapine (SEROQUEL) 25 MG tablet Take 1 tablet (25 mg total) by mouth at bedtime. (Patient taking differently: Take 12.5-25 mg by mouth 2 (two) times daily. Take half a tablet in the morning and one tablet at bedtime.) 30 tablet 0    PTA Medications: (Not in a hospital admission)      06/27/2021    3:18 AM 05/01/2021    2:16 AM 02/03/2020    2:24 AM  Depression screen PHQ 2/9  Decreased Interest '1 2 3  '$ Down, Depressed, Hopeless '2 3 3  '$ PHQ - 2 Score '3 5 6  '$ Altered sleeping 0  0 3  Tired, decreased energy '1 1 3  '$ Change in appetite 0 1 3  Feeling bad or failure about yourself  '1 3 3  '$ Trouble concentrating '2 3 3  '$ Moving slowly or fidgety/restless '1 1 3  '$ Suicidal thoughts '1 3 3  '$ PHQ-9 Score '9 17 27  '$ Difficult doing work/chores Somewhat difficult Extremely dIfficult     Flowsheet Row ED from 10/25/2021 in [facility] Most recent reading at 10/25/2021 10:47 PM Admission (Discharged) from 10/18/2021 in [facility] Most recent reading at 10/18/2021 10:54 PM ED from 10/18/2021 in [facility] Most recent reading at 10/18/2021  3:55 PM  C-SSRS RISK CATEGORY High Risk High Risk High Risk       Musculoskeletal  Strength & Muscle Tone: within normal limits Gait & Station: normal Patient leans: N/A  Psychiatric Specialty Exam  Presentation  General Appearance: Casual; Appropriate for Environment; Fairly Groomed  Eye Contact:Fair  Speech:Clear and Coherent; Normal Rate  Speech Volume:Normal  Handedness:Right   Mood and Affect  Mood:Euthymic  Affect:Appropriate; Congruent   Thought Process  Thought Processes:Coherent  Descriptions of Associations:Intact  Orientation:Full (Time, Place and Person)  Thought Content:Logical  Diagnosis of Schizophrenia or Schizoaffective disorder in past: Yes    Hallucinations:Hallucinations: None  Ideas of Reference:None  Suicidal Thoughts:Suicidal Thoughts: No  Homicidal Thoughts:Homicidal Thoughts: No   Sensorium  Memory:Immediate Fair  Judgment:Fair  Insight:Fair   Executive Functions  Concentration:Fair  Attention Span:Fair  [place] of Knowledge:Good  Language:Good   Psychomotor Activity  Psychomotor Activity:Psychomotor Activity: Normal   Assets  Assets:Communication Skills; Desire for Improvement; Financial Resources/Insurance; Housing; Social Support; Vocational/Educational  Sleep   Sleep:Sleep: Fair  Nutritional Assessment (For OBS and FBC admissions only) Has the patient had a weight loss or gain of 10 pounds or more in the last 3 months?: Yes Has the patient had a decrease in food intake/or appetite?: No Does the patient have dental problems?: No Does the patient have eating habits or behaviors that may be indicators of an eating disorder including binging or inducing vomiting?: No Has the patient recently lost weight without trying?: 0 Has the patient been eating poorly because of a decreased appetite?: 0 Malnutrition Screening Tool Score: 0   Physical Exam  Physical Exam Cardiovascular:     Rate and Rhythm: Normal rate.  Pulmonary:     Effort: Pulmonary effort is normal.  Skin:    Comments: Superficial lacerations noted on forearms  Neurological:     Mental Status: She is alert and oriented to person, place, and time.  Psychiatric:        Attention and Perception: Attention and perception normal.        Mood and Affect: Mood and affect normal.        Speech: Speech normal.  Behavior: Behavior normal. Behavior is cooperative.        Thought Content: Thought content normal.        Cognition and Memory: Cognition and memory normal.    Review of Systems  Constitutional:  Negative for chills and fever.  Respiratory:  Negative for shortness of breath.   Cardiovascular:  Negative for chest pain and palpitations.  Gastrointestinal:  Negative for abdominal pain.  Neurological:  Negative for headaches.  Psychiatric/Behavioral: Negative.     Blood pressure 128/71, pulse 95, temperature 98.1 F (36.7 C), temperature source Oral, resp. rate 20, SpO2 99 %. There is no height or weight on file to calculate BMI.  Demographic Factors:  Divorced or widowed, Caucasian, and Unemployed  Loss Factors: Financial problems/change in socioeconomic status  Historical Factors: Prior suicide attempts and Impulsivity  Risk Reduction Factors:   Living with  another person, especially a relative and Positive social support  Continued Clinical Symptoms:  Previous Psychiatric Diagnoses and Treatments  Cognitive Features That Contribute To Risk:  None    Suicide Risk:  Mild:  Suicidal ideation of limited frequency, intensity, duration, and specificity.  There are no identifiable plans, no associated intent, mild dysphoria and related symptoms, good self-control (both objective and subjective assessment), few other risk factors, and identifiable protective factors, including available and accessible social support.  Plan Of Care/Follow-up recommendations:  Discharge and follow up with CST  Disposition:  Discharge and follow up with CST  Tharon Aquas, NP 10/26/2021, 2:26 PM

## 2021-10-26 NOTE — ED Notes (Signed)
Pt sleeping@this time. Breathing even and unlabored. Will continue to monitor for safety 

## 2021-10-26 NOTE — ED Notes (Signed)
Pt sleeping in no acute distress. RR even and unlabored. Safety maintained. 

## 2021-10-26 NOTE — ED Notes (Signed)
Patient A&O x 4, ambulatory. Patient discharged in no acute distress. Patient denied SI/HI, A/VH upon discharge. Patient verbalized understanding of all discharge instructions explained by staff, to include follow up appointments, RX's and safety plan. Pt belongings returned to patient from locker #3  intact. Patient escorted to lobby via staff for transport to destination. Safety maintained.

## 2021-10-26 NOTE — ED Notes (Signed)
Pt was sleeping but easily aroused to name being called. Took all am medication without difficulty. Pt continue to endorse passive SI but states, "the sleeping helped me some but I still have thoughts to hurt myself (gesturing by holding out her arm). Observed self inflicted cuts/scratches to L arm. Pt received ointment for areas. Pt states, "I want to go inpatient. They are going to send me to [facility] aren't they"? Informed pt that as soon as a plan was made, she would be made aware. Pt verbalized understanding and agreement. Informed pt to notify staff with any impulses/urges to harm self prior to acting on thoughts. Pt verbalized agreement. Will continue to monitor for safety.

## 2021-10-27 ENCOUNTER — Telehealth (HOSPITAL_COMMUNITY): Payer: Self-pay

## 2021-10-27 NOTE — BH Assessment (Signed)
Care Management - [facility] Follow Up Discharges  ° °Writer attempted to make contact with patient today and was unsuccessful.  Writer left a HIPPA compliant voice message.  ° °Per chart review, patient was provided with outpatient resources. ° °

## 2021-10-28 ENCOUNTER — Emergency Department (HOSPITAL_COMMUNITY)
Admission: EM | Admit: 2021-10-28 | Discharge: 2021-10-30 | Disposition: A | Discharge status: Home / Self Care | Payer: PPO | Attending: Emergency Medicine | Admitting: Emergency Medicine

## 2021-10-28 ENCOUNTER — Encounter (HOSPITAL_COMMUNITY): Payer: Self-pay

## 2021-10-28 DIAGNOSIS — F259 Schizoaffective disorder, unspecified: Secondary | ICD-10-CM | POA: Diagnosis not present

## 2021-10-28 DIAGNOSIS — R9431 Abnormal electrocardiogram [ECG] [EKG]: Secondary | ICD-10-CM | POA: Diagnosis not present

## 2021-10-28 DIAGNOSIS — R42 Dizziness and giddiness: Secondary | ICD-10-CM | POA: Diagnosis not present

## 2021-10-28 DIAGNOSIS — Z79899 Other long term (current) drug therapy: Secondary | ICD-10-CM | POA: Insufficient documentation

## 2021-10-28 DIAGNOSIS — Z20822 Contact with and (suspected) exposure to covid-19: Secondary | ICD-10-CM | POA: Diagnosis not present

## 2021-10-28 DIAGNOSIS — T50902A Poisoning by unspecified drugs, medicaments and biological substances, intentional self-harm, initial encounter: Secondary | ICD-10-CM | POA: Diagnosis not present

## 2021-10-28 DIAGNOSIS — R000 Tachycardia, unspecified: Secondary | ICD-10-CM | POA: Diagnosis not present

## 2021-10-28 DIAGNOSIS — R45851 Suicidal ideations: Secondary | ICD-10-CM | POA: Insufficient documentation

## 2021-10-28 DIAGNOSIS — I959 Hypotension, unspecified: Secondary | ICD-10-CM | POA: Diagnosis not present

## 2021-10-28 DIAGNOSIS — F603 Borderline personality disorder: Secondary | ICD-10-CM | POA: Diagnosis not present

## 2021-10-28 NOTE — ED Triage Notes (Signed)
Pt comes from home via [facility] after suicide attempt, pt took handfuls of Seroquel, propanol, protonix, prozosin, and lexapro at 1030pm. C/O of SOB, abd pain and nausea.

## 2021-10-28 NOTE — ED Provider Notes (Signed)
[facility] EMERGENCY DEPARTMENT Provider Note   CSN: [id number] Arrival date & time: 10/28/21  2349     History {Add pertinent medical, surgical, social history, OB history to HPI:1} Chief Complaint  Patient presents with   Suicidal    [name] is a 31 y.o. female.  HPI     Home Medications Prior to Admission medications   Medication Sig Start Date End Date Taking? Authorizing Provider  albuterol (VENTOLIN HFA) 108 (90 Base) MCG/ACT inhaler Inhale 2 puffs into the lungs every 6 (six) hours as needed for wheezing or shortness of breath.    [provider]  budesonide-formoterol (SYMBICORT) 160-4.5 MCG/ACT inhaler Inhale 2 puffs into the lungs 2 (two) times daily.    [provider]  calcium carbonate (TUMS EX) 750 MG chewable tablet Chew 1,500 mg by mouth 2 (two) times daily as needed for heartburn.    [provider]  cariprazine (VRAYLAR) 3 MG capsule Take 1 capsule (3 mg total) by mouth daily. 07/12/21   Massengill, Ovid Curd, MD  cyclobenzaprine (FLEXERIL) 10 MG tablet Take 10 mg by mouth at bedtime.    [provider]  diclofenac Sodium (VOLTAREN) 1 % GEL Apply 2 g topically 4 (four) times daily. 06/23/21   Harris, Abigail, PA-C  escitalopram (LEXAPRO) 5 MG tablet Take 3 tablets (15 mg total) by mouth daily. 09/05/21   Merrily Brittle, DO  ferrous sulfate 325 (65 FE) MG tablet Take 1 tablet (325 mg total) by mouth every other day. 10/11/21 11/10/21  France Ravens, MD  hydrOXYzine (ATARAX) 25 MG tablet Take 25 mg by mouth 3 (three) times daily as needed for anxiety.    [provider]  Lidocaine-Menthol (ICY HOT MAX LIDOCAINE EX) Apply 1 Application topically daily as needed (pain).    [provider]  Multiple Vitamins-Minerals (EMERGEN-C IMMUNE PO) Take 1 packet by mouth daily as needed (Immune support).    [provider]  neomycin-bacitracin-polymyxin (NEOSPORIN) OINT Apply 1 Application  topically 2 (two) times daily. 10/23/21   Massengill, Ovid Curd, MD  pantoprazole (PROTONIX) 40 MG tablet TAKE 1 TABLET BY MOUTH DAILY Patient taking differently: 40 mg daily. 04/26/21   Jeanie Sewer, NP  prazosin (MINIPRESS) 1 MG capsule Take 1 capsule (1 mg total) by mouth at bedtime for 7 days. 07/28/21 11/11/21  Massengill, Ovid Curd, MD  pregabalin (LYRICA) 50 MG capsule Take 1 capsule (50 mg total) by mouth every 8 (eight) hours for 14 days. 10/23/21 11/06/21  Massengill, Ovid Curd, MD  propranolol (INDERAL) 10 MG tablet Take 1 tablet (10 mg total) by mouth 2 (two) times daily. 07/11/21   Massengill, Ovid Curd, MD  QUEtiapine (SEROQUEL) 25 MG tablet Take 1 tablet (25 mg total) by mouth at bedtime. Patient taking differently: Take 12.5-25 mg by mouth 2 (two) times daily. Take half a tablet in the morning and one tablet at bedtime. 10/23/21 11/22/21  Massengill, Ovid Curd, MD      Allergies    Bee venom, Coconut flavor, Fish allergy, Geodon [ziprasidone hcl], Haloperidol and related, Lithobid [lithium], Roxicodone [oxycodone], Seroquel [quetiapine], Shellfish allergy, Phenergan [promethazine hcl], Prilosec [omeprazole], Sulfa antibiotics, Tegretol [carbamazepine], Prozac [fluoxetine], Tape, and Tylenol [acetaminophen]    Review of Systems   Review of Systems  Physical Exam Updated Vital Signs SpO2 96%  Physical Exam  ED Results / Procedures / Treatments   Labs (all labs ordered are listed, but only abnormal results are displayed) Labs Reviewed - No data to display  EKG None  Radiology No  results found.  Procedures Procedures  {Document cardiac monitor, telemetry assessment procedure when appropriate:1}  Medications Ordered in ED Medications - No data to display  ED Course/ Medical Decision Making/ A&P                           Medical Decision Making  ***  {Document critical care time when appropriate:1} {Document review of labs and clinical decision tools ie heart score, Chads2Vasc2  etc:1}  {Document your independent review of radiology images, and any outside records:1} {Document your discussion with family members, caretakers, and with consultants:1} {Document social determinants of health affecting pt's care:1} {Document your decision making why or why not admission, treatments were needed:1} Final Clinical Impression(s) / ED Diagnoses Final diagnoses:  None    Rx / DC Orders ED Discharge Orders     None

## 2021-10-29 ENCOUNTER — Other Ambulatory Visit: Payer: Self-pay

## 2021-10-29 ENCOUNTER — Encounter (HOSPITAL_COMMUNITY): Payer: Self-pay

## 2021-10-29 DIAGNOSIS — F259 Schizoaffective disorder, unspecified: Secondary | ICD-10-CM | POA: Diagnosis not present

## 2021-10-29 DIAGNOSIS — R9431 Abnormal electrocardiogram [ECG] [EKG]: Secondary | ICD-10-CM | POA: Diagnosis not present

## 2021-10-29 DIAGNOSIS — T50902A Poisoning by unspecified drugs, medicaments and biological substances, intentional self-harm, initial encounter: Secondary | ICD-10-CM | POA: Diagnosis not present

## 2021-10-29 DIAGNOSIS — R45851 Suicidal ideations: Secondary | ICD-10-CM | POA: Diagnosis not present

## 2021-10-29 LAB — COMPREHENSIVE METABOLIC PANEL
ALT: 14 U/L (ref 0–44)
AST: 17 U/L (ref 15–41)
Albumin: 3.6 g/dL (ref 3.5–5.0)
Alkaline Phosphatase: 76 U/L (ref 38–126)
Anion gap: 8 (ref 5–15)
BUN: 6 mg/dL (ref 6–20)
CO2: 23 mmol/L (ref 22–32)
Calcium: 9.1 mg/dL (ref 8.9–10.3)
Chloride: 107 mmol/L (ref 98–111)
Creatinine, Ser: 0.7 mg/dL (ref 0.44–1.00)
GFR, Estimated: >60 mL/min (ref >60)
Glucose, Bld: 112 mg/dL — ABNORMAL HIGH (ref 70–99)
Potassium: 3.8 mmol/L (ref 3.5–5.1)
Sodium: 138 mmol/L (ref 135–145)
Total Bilirubin: 0.3 mg/dL (ref 0.3–1.2)
Total Protein: 6.4 g/dL — ABNORMAL LOW (ref 6.5–8.1)

## 2021-10-29 LAB — CBC
HCT: 37 % (ref 36.0–46.0)
Hemoglobin: 11.8 g/dL — ABNORMAL LOW (ref 12.0–15.0)
MCH: 26.3 pg (ref 26.0–34.0)
MCHC: 31.9 g/dL (ref 30.0–36.0)
MCV: 82.4 fL (ref 80.0–100.0)
Platelets: 292 10*3/uL (ref 150–400)
RBC: 4.49 MIL/uL (ref 3.87–5.11)
RDW: 13.7 % (ref 11.5–15.5)
WBC: 9.5 10*3/uL (ref 4.0–10.5)
nRBC: 0 % (ref 0.0–0.2)

## 2021-10-29 LAB — RAPID URINE DRUG SCREEN, HOSP PERFORMED
Amphetamines: NOT DETECTED
Barbiturates: NOT DETECTED
Benzodiazepines: NOT DETECTED
Cocaine: NOT DETECTED
Opiates: NOT DETECTED
Tetrahydrocannabinol: NOT DETECTED

## 2021-10-29 LAB — ACETAMINOPHEN LEVEL: Acetaminophen (Tylenol), Serum: <10 ug/mL — ABNORMAL LOW (ref 10–30)

## 2021-10-29 LAB — I-STAT BETA HCG BLOOD, ED (MC, WL, AP ONLY): I-stat hCG, quantitative: <5 m[IU]/mL (ref <5)

## 2021-10-29 LAB — ETHANOL: Alcohol, Ethyl (B): <10 mg/dL (ref <10)

## 2021-10-29 LAB — SALICYLATE LEVEL: Salicylate Lvl: <7 mg/dL — ABNORMAL LOW (ref 7.0–30.0)

## 2021-10-29 LAB — RESP PANEL BY RT-PCR (FLU A&B, COVID) ARPGX2
Influenza A by PCR: NEGATIVE
Influenza B by PCR: NEGATIVE
SARS Coronavirus 2 by RT PCR: NEGATIVE

## 2021-10-29 LAB — LACTIC ACID, PLASMA: Lactic Acid, Venous: 1.8 mmol/L (ref 0.5–1.9)

## 2021-10-29 MED ORDER — LACTATED RINGERS IV BOLUS
2000.0000 mL | Freq: Once | INTRAVENOUS | Status: AC
  Administered 2021-10-29: 2000 mL via INTRAVENOUS

## 2021-10-29 MED ORDER — SODIUM CHLORIDE 0.9 % IV BOLUS
1000.0000 mL | Freq: Once | INTRAVENOUS | Status: AC
  Administered 2021-10-29: 1000 mL via INTRAVENOUS

## 2021-10-29 MED ORDER — LACTATED RINGERS IV BOLUS
1000.0000 mL | Freq: Once | INTRAVENOUS | Status: AC
  Administered 2021-10-29: 1000 mL via INTRAVENOUS

## 2021-10-29 NOTE — ED Notes (Signed)
Pt placed in burgundy scrubs
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
## 2021-10-29 NOTE — ED Notes (Signed)
BP dropped, pt is still AxO, c/o of dizziness, IV and fluids started, MD notified

## 2021-10-29 NOTE — ED Notes (Signed)
Belongings inventoried and placed in locker #5

## 2021-10-29 NOTE — ED Notes (Signed)
Snack offered to patient, patient declined

## 2021-10-29 NOTE — ED Notes (Signed)
Called for meal tray.

## 2021-10-29 NOTE — Progress Notes (Signed)
Per Merlyn Lot, NP, patient meets criteria for inpatient treatment. There are no available beds at [facility] today. CSW faxed referrals to the following facilities for review:  [address] [place] [address] [phone] --  [place] [address], [place] [address] [phone] --  [place] [address] [place] [address] [phone] --  [place] [facility]  Pending - Request Sent N/A [place], [place] [address] [phone] --  [place] [address] [place], [place] [address] [phone] --  [place] [place] [address] [phone] --  [facility]  Pending - Request Sent N/A [address], [place] [address] [place] [address] [place] [address] [phone] --  [facility]  Pending - Request Sent N/A [address] [place] [address] [phone] --  [facility]  Pending - Request Sent N/A [address] [place] [address] [phone] --  [facility]  Pending - Request Sent N/A [place], [place] [address] [phone] --  [facility]  Pending - Request Sent N/A [address] [place] [address] [phone] --  [facility]  Pending - Request Sent N/A [address] [place] [address] [phone] --  [facility]  Pending - Request Sent N/A [address]  [place] [address] [phone] --   TTS will continue to seek bed placement.  Glennie Isle, MSW, [facility] [place] Phone: [phone] Disposition/TOC

## 2021-10-29 NOTE — ED Provider Notes (Signed)
  Physical Exam  BP (!) 111/57   Pulse 75   Temp 98 F (36.7 C) (Oral)   Resp 15   Ht '5\' 5"'$  (1.651 m)   Wt 91.6 kg   SpO2 100%   BMI 33.61 kg/m   Physical Exam Vitals and nursing note reviewed.  Constitutional:      General: She is not in acute distress.    Appearance: She is well-developed.  HENT:     Head: Normocephalic and atraumatic.     Mouth/Throat:     Mouth: Mucous membranes are moist.  Eyes:     Pupils: Pupils are equal, round, and reactive to light.  Cardiovascular:     Rate and Rhythm: Normal rate and regular rhythm.     Heart sounds: No murmur heard. Pulmonary:     Effort: Pulmonary effort is normal. No respiratory distress.     Breath sounds: Normal breath sounds.  Abdominal:     General: Abdomen is flat.     Palpations: Abdomen is soft.     Tenderness: There is no abdominal tenderness.  Musculoskeletal:        General: No tenderness.     Right lower leg: No edema.     Left lower leg: No edema.  Skin:    General: Skin is warm and dry.  Neurological:     General: No focal deficit present.     Mental Status: She is alert. Mental status is at baseline.  Psychiatric:        Mood and Affect: Mood normal.        Behavior: Behavior normal.     Procedures  .Critical Care  Performed by: Cristie Hem, MD Authorized by: Cristie Hem, MD   Critical care provider statement:    Critical care time (minutes):  30   Critical care was necessary to treat or prevent imminent or life-threatening deterioration of the following conditions:  Toxidrome, shock, dehydration and circulatory failure   Critical care was time spent personally by me on the following activities:  Development of treatment plan with patient or surrogate, evaluation of patient's response to treatment, examination of patient, ordering and review of laboratory studies, ordering and review of radiographic studies, ordering and performing treatments and interventions, pulse oximetry,  re-evaluation of patient's condition and review of old charts   ED Course / MDM   Clinical Course as of 10/29/21 0928  Sun Oct 29, 2021  0709 Received sign out from Dr. Dayna Barker, took OD of prazosn, seroquel, lexapro, protonix, got hypotensive which improved with fluids. Getting additional fluids.  [WS]  7517 Blood pressure stable over past hour, without additional fluid boluses.  MAP 74, 73.  Patient awake alert, eating breakfast.  Will medically Clear patient.  Patient will be psych border.  While boarding, will continue to periodically monitor blood pressure. [WS]    Clinical Course User Index [WS] Cristie Hem, MD   Medical Decision Making Amount and/or Complexity of Data Reviewed Labs: ordered.     Cristie Hem, MD 10/29/21 [phone]

## 2021-10-30 DIAGNOSIS — T50902A Poisoning by unspecified drugs, medicaments and biological substances, intentional self-harm, initial encounter: Secondary | ICD-10-CM | POA: Diagnosis not present

## 2021-10-30 DIAGNOSIS — R45851 Suicidal ideations: Secondary | ICD-10-CM | POA: Diagnosis not present

## 2021-10-30 NOTE — ED Notes (Signed)
Pt has been accepted to [facility] pending immediately. Upon going in and talking to the patient who is currently voluntary, pt stated she did not want to go that far that her birthday was in 2 days and then she also would have no way home. This RN explained that they would make arrangements to get her back home. Pt stated "Yeah on a greyhound and I cannot ride a bus I will get lost and never make it home." This RN reached out to the EDP for suggestions on what to do and EDP wanted this RN to reach out to psych to see if they wanted an IVC placed or if they wanted to reassess the patient. This RN will continue to monitor.

## 2021-10-30 NOTE — Progress Notes (Signed)
[facility] LCSW Progress Note   10/30/2021    9:58 AM  [name]   [id number]   Type of Contact and Topic:  Psychiatric Bed Placement   Pt accepted to [facility]   Patient meets inpatient criteria per  Weber Cooks, NP.   The attending provider will be Myles Lipps, MD   Call report to [phone]  Wayne Sever, RN @ [facility] ED notified via secure chat.     Pt scheduled  to arrive at [place].  Signed:  Durenda Hurt, MSW, LCSWA, LCAS 10/30/2021 9:59 AM

## 2021-10-30 NOTE — Consult Note (Signed)
[facility] ED ASSESSMENT   Reason for Consult:  SI Referring Physician:  Dr. Jeanell Sparrow Patient Identification: [name] MRN:  [id number] ED Chief Complaint: Suicidal ideation  Diagnosis:  Principal Problem:   Suicidal ideation Active Problems:   Schizoaffective disorder ([place])   Borderline personality disorder [facility])   ED Assessment Time Calculation: Start Time: 1100 Stop Time: 1130 Total Time in Minutes (Assessment Completion): 30   HPI:   [name] is a 31 y.o. female patient who presents to ED voluntarily after ingesting medications in an attempt to self-harm.  Patient endorsing ongoing SI.  Patient reports there is a lot going on with her family that upsets her.  Patient has an extensive history of inpatient admissions and was last seen on 9/20 when she presented to be helped with suicidal ideations and a plan to run through her window at home.  She receives outpatient services through [facility] along with weekly individual therapy at [facility].  Subjective:   Patient seen this [facility] at [name] ED for face-to-face evaluation.  Patient received an inpatient bed at [facility] however patient is declining to go and asking to go home.  Patient tells me she lives with her ex-boyfriend in her apartment in [place].  They got into an argument which triggered her to take a random assortment of pills.  She tells me she did this to make him upset and he realizes she should not have done that.  She denies current suicidal ideations.  Denies any homicidal ideations.  Denies any auditory or visual hallucinations.  I asked her why she was saying she was homicidal towards her family yesterday and she responded "I don't' know why I said that, I'm fine with everyone I don't want to hurt anyone."  Patient stated, " my birthday is in 2 days and I do not want to be in the hospital for my birthday.  If I go to [facility] there is no way that I will be out in time to celebrate my birthday with  everyone and I am not going to miss that."   She is able to engage in coherent and logical conversation.  Speech is normal in rate and tone.  No evidence of responding to internal stimuli, no concerns for psychosis at this time.  Patient is a high utilizer of the hospital system, she has presented to ED/UC 6 times for suicidal ideations in the month of September.  There are concerns for secondary gain for this hospitalization.  She is unable to tell me exactly how many pills she took in her suicide attempt.  She tells me it was a handful, I asked her to try and describe exactly how any pills that would be and she stated "I do not know maybe 6 to 8 pills."  Inpatient admission to [facility] was still offered to her however she is declining.  Does appear her suicidal ideations are chronic in nature and there are concerns for secondary gain. She does not meet criteria for IVC.  She is hoping to get a cab home to her apartment.  She tells me she gets along with her ex-boyfriend who lives with her for most of the time.  Does feel safe returning to the same home with him.  Declines wanting me to call any family members or her ex.  He is able to contract for safety at this time.  She has an appointment with her outpatient psychiatrist through [facility] on October 10, and has her  weekly therapy appointment on Wednesday.  Past Psychiatric History:  Significant psychiatric history for schizoaffective disorder, borderline personality disorder, suicidal ideations, suicide attempts, and malingering.  Reported history of Aspergers.    Risk to Self or Others: Is the patient at risk to self? No Has the patient been a risk to self in the past 6 months? Yes Has the patient been a risk to self within the distant past? No Is the patient a risk to others? No Has the patient been a risk to others in the past 6 months? No Has the patient been a risk to others within the distant past? No  [place] Scale:  [place] ED  from 10/28/2021 in [place] ED from 10/25/2021 in [facility] Admission (Discharged) from 10/18/2021 in [place] [facility]  C-SSRS RISK CATEGORY High Risk High Risk High Risk       ASAM: ASAM Multidimensional Assessment Summary Dimension 1:  Description of individual's past and current experiences of substance use and withdrawal: NA DImension 1:  Acute Intoxication and/or Withdrawal Potential Severity Rating: None Dimension 2:  Description of patient's biomedical conditions and  complications: NA Dimension 2:  Biomedical Conditions and Complications Severity Rating: None Dimension 3:  Description of emotional, behavioral, or cognitive conditions and complications: NA Dimension 3:  Emotional, behavioral or cognitive (EBC) conditions and complications severity rating: None Dimension 4:  Description of Readiness to Change criteria: NA Dimension 4:  Readiness to Change Severity Rating: None Dimension 5:  Relapse, continued use, or continued problem potential critiera description: NA Dimension 5:  Relapse, continued use, or continued problem potential severity rating: None Dimension 6:  Recovery/Iiving environment criteria description: NA Dimension 6:  Recovery/living environment severity rating: None ASAM's Severity Rating Score: 0 ASAM Recommended Level of Treatment:  (N/A)  Substance Abuse:  Alcohol / Drug Use Pain Medications: See MAR Prescriptions: See MAR Over the Counter: See MAR History of alcohol / drug use?: No history of alcohol / drug abuse Longest period of sobriety (when/how long): N/A Negative Consequences of Use:  (N/A) Withdrawal Symptoms:  (N/A)  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 fragile X syndrome    Chronic constipation    Depression    Drug-seeking  behavior    Essential tremor    Headache    Overdose of acetaminophen 07/2017   and other meds   Personality disorder ([place])    Schizo-affective psychosis ([name])    Schizoaffective disorder, bipolar type ([place])    Seizures ([place])    Last seizure December 2017   Sleep apnea     Past Surgical History:  Procedure Laterality Date   MOUTH SURGERY  2009 or 2010   Family History:  Family History  Problem Relation Age of Onset   Mental illness Father    Asthma Father    PDD Brother    Seizures Brother    Social History:  Social History   Substance and Sexual Activity  Alcohol Use No   Alcohol/week: 1.0 standard drink of alcohol   Types: 1 Standard drinks or equivalent per week   Comment: denies at this time     Social History   Substance and Sexual Activity  Drug Use No   Comment: History of cocaine use at age 14 for  4 months    Social History   Socioeconomic History   Marital status: Widowed    Spouse name: Not on file   Number of children: 0   Years of education: Not on file   Highest education level: Not on file  Occupational History   Occupation: disability  Tobacco Use   Smoking status: Former    Packs/day: 0.00    Types: Cigarettes   Smokeless tobacco: Never   Tobacco comments:    Smoked for 2  years age [age]  Vaping Use   Vaping Use: Never used  Substance and Sexual Activity   Alcohol use: No    Alcohol/week: 1.0 standard drink of alcohol    Types: 1 Standard drinks or equivalent per week    Comment: denies at this time   Drug use: No    Comment: History of cocaine use at age 68 for 4 months   Sexual activity: Not Currently    Birth control/protection: None  Other Topics Concern   Not on file  Social History Narrative   Marital status: Widowed      Children: daughter      Lives: with boyfriend, in two story home      Employment:  Disability      Tobacco: quit smoking; smoked for two years.      Alcohol ;none      Drugs: none   Has not traveled  outside of the country.   Right handed         Social Determinants of Health   Financial Resource Strain: Not on file  Food Insecurity: No Food Insecurity (10/18/2021)   Hunger Vital Sign    Worried About Running Out of Food in the Last Year: Never true    Ran Out of Food in the Last Year: Never true  Transportation Needs: No Transportation Needs (10/18/2021)   PRAPARE - Hydrologist (Medical): No    Lack of Transportation (Non-Medical): No  Physical Activity: Not on file  Stress: Not on file  Social Connections: Not on file    Allergies:   Allergies  Allergen Reactions   Bee Venom Anaphylaxis   Coconut Flavor Anaphylaxis and Rash   Fish Allergy Anaphylaxis   Geodon [Ziprasidone Hcl] Other (See Comments)    Pt states that this medication causes paralysis of the mouth.     Haloperidol And Related Other (See Comments)    Pt states that this medication causes paralysis of the mouth, jaw locks up   Lithobid [Lithium] Other (See Comments)    Seizure-like activity    Roxicodone [Oxycodone] Other (See Comments)    Hallucinations    Seroquel [Quetiapine] Other (See Comments)    Severe drowsiness Pt currently taking 50 mg, she is ok with this strength, she does not want higher dose.   Shellfish Allergy Anaphylaxis   Phenergan [Promethazine Hcl] Other (See Comments)    Chest pain     Prilosec [Omeprazole] Nausea And Vomiting    Pt can take protonix with no problems    Sulfa Antibiotics Other (See Comments)    Chest pain    Tegretol [Carbamazepine] Nausea And Vomiting   Prozac [Fluoxetine] Other (See Comments)    Increased Depression Suicidal thoughts   Tape Other (See Comments)    Skin tears, can only tolerate paper tape.   Tylenol [Acetaminophen] Other (See Comments)    Unknown reaction    Labs:  Results for orders placed or performed during the hospital  encounter of 10/28/21 (from the past 48 hour(s))  Comprehensive metabolic panel      Status: Abnormal   Collection Time: 10/29/21 12:15 AM  Result Value Ref Range   Sodium 138 135 - 145 mmol/L   Potassium 3.8 3.5 - 5.1 mmol/L   Chloride 107 98 - 111 mmol/L   CO2 23 22 - 32 mmol/L   Glucose, Bld 112 (H) 70 - 99 mg/dL    Comment: Glucose reference range applies only to samples taken after fasting for at least 8 hours.   BUN 6 6 - 20 mg/dL   Creatinine, Ser 0.70 0.44 - 1.00 mg/dL   Calcium 9.1 8.9 - 10.3 mg/dL   Total Protein 6.4 (L) 6.5 - 8.1 g/dL   Albumin 3.6 3.5 - 5.0 g/dL   AST 17 15 - 41 U/L   ALT 14 0 - 44 U/L   Alkaline Phosphatase 76 38 - 126 U/L   Total Bilirubin 0.3 0.3 - 1.2 mg/dL   GFR, Estimated >60 >60 mL/min    Comment: (NOTE) Calculated using the CKD-EPI Creatinine Equation (2021)    Anion gap 8 5 - 15    Comment: Performed at [facility], [place] [address] [place], [place] [address]  Ethanol     Status: None   Collection Time: 10/29/21 12:15 AM  Result Value Ref Range   Alcohol, Ethyl (B) <10 <10 mg/dL    Comment: (NOTE) Lowest detectable limit for serum alcohol is 10 mg/dL.  For medical purposes only. Performed at [facility], [place] [address], [place], [place] [address]   Salicylate level     Status: Abnormal   Collection Time: 10/29/21 12:15 AM  Result Value Ref Range   Salicylate Lvl <0.0 (L) 7.0 - 30.0 mg/dL    Comment: Performed at [facility] [address], [place], [place] [address]  Acetaminophen level     Status: Abnormal   Collection Time: 10/29/21 12:15 AM  Result Value Ref Range   Acetaminophen (Tylenol), Serum <10 (L) 10 - 30 ug/mL    Comment: (NOTE) Therapeutic concentrations vary significantly. A range of 10-30 ug/mL  may be an effective concentration for many patients. However, some  are best treated at concentrations outside of this range. Acetaminophen concentrations >150 ug/mL at 4 hours after ingestion  and >50 ug/mL at 12 hours after ingestion are often associated with  toxic  reactions.  Performed at [place] [facility], [place] [address] [place], [place] [address]   cbc     Status: Abnormal   Collection Time: 10/29/21 12:15 AM  Result Value Ref Range   WBC 9.5 4.0 - 10.5 K/uL   RBC 4.49 3.87 - 5.11 MIL/uL   Hemoglobin 11.8 (L) 12.0 - 15.0 g/dL   HCT 37.0 36.0 - 46.0 %   MCV 82.4 80.0 - 100.0 fL   MCH 26.3 26.0 - 34.0 pg   MCHC 31.9 30.0 - 36.0 g/dL   RDW 13.7 11.5 - 15.5 %   Platelets 292 150 - 400 K/uL   nRBC 0.0 0.0 - 0.2 %    Comment: Performed at [facility], [place] [address], [place], [place] [address]  I-Stat beta hCG blood, ED     Status: None   Collection Time: 10/29/21 12:20 AM  Result Value Ref Range   I-stat hCG, quantitative <5.0 <5 mIU/mL   Comment 3            Comment:   GEST.  AGE      CONC.  (mIU/mL)   <=1 WEEK        5 - 50     2 WEEKS       50 - 500     3 WEEKS       100 - 10,000     4 WEEKS     1,000 - 30,000        FEMALE AND NON-PREGNANT FEMALE:     LESS THAN 5 mIU/mL   Rapid urine drug screen (hospital performed)     Status: None   Collection Time: 10/29/21  2:01 AM  Result Value Ref Range   Opiates NONE DETECTED NONE DETECTED   Cocaine NONE DETECTED NONE DETECTED   Benzodiazepines NONE DETECTED NONE DETECTED   Amphetamines NONE DETECTED NONE DETECTED   Tetrahydrocannabinol NONE DETECTED NONE DETECTED   Barbiturates NONE DETECTED NONE DETECTED    Comment: (NOTE) DRUG SCREEN FOR MEDICAL PURPOSES ONLY.  IF CONFIRMATION IS NEEDED FOR ANY PURPOSE, NOTIFY LAB WITHIN 5 DAYS.  LOWEST DETECTABLE LIMITS FOR URINE DRUG SCREEN Drug Class                     Cutoff (ng/mL) Amphetamine and metabolites    1000 Barbiturate and metabolites    200 Benzodiazepine                 940 Tricyclics and metabolites     300 Opiates and metabolites        300 Cocaine and metabolites        300 THC                            50 Performed at [facility], [place] [address] [place], [place] [address]   Lactic acid, plasma      Status: None   Collection Time: 10/29/21  4:20 AM  Result Value Ref Range   Lactic Acid, Venous 1.8 0.5 - 1.9 mmol/L    Comment: Performed at [facility] [address] [place], [place] [address]  Resp Panel by RT-PCR (Flu A&B, Covid) Anterior Nasal Swab     Status: None   Collection Time: 10/29/21 12:00 PM   Specimen: Anterior Nasal Swab  Result Value Ref Range   SARS Coronavirus 2 by RT PCR NEGATIVE NEGATIVE    Comment: (NOTE) SARS-CoV-2 target nucleic acids are NOT DETECTED.  The SARS-CoV-2 RNA is generally detectable in upper respiratory specimens during the acute phase of infection. The lowest concentration of SARS-CoV-2 viral copies this assay can detect is 138 copies/mL. A negative result does not preclude SARS-Cov-2 infection and should not be used as the sole basis for treatment or other patient management decisions. A negative result may occur with  improper specimen collection/handling, submission of specimen other than nasopharyngeal swab, presence of viral mutation(s) within the areas targeted by this assay, and inadequate number of viral copies(<138 copies/mL). A negative result must be combined with clinical observations, patient history, and epidemiological information. The expected result is Negative.  Fact Sheet for Patients:  EntrepreneurPulse.com.au  Fact Sheet for Healthcare Providers:  IncredibleEmployment.be  This test is no t yet approved or cleared by the [place] FDA and  has been authorized for detection and/or diagnosis of SARS-CoV-2 by FDA under an Emergency Use Authorization (EUA). This EUA will remain  in effect (meaning this test can be used) for the duration of the COVID-19  declaration under Section 564(b)(1) of the Act, 21 U.S.C.section 360bbb-3(b)(1), unless the authorization is terminated  or revoked sooner.       Influenza A by PCR NEGATIVE NEGATIVE   Influenza B by PCR NEGATIVE NEGATIVE     Comment: (NOTE) The Xpert Xpress SARS-CoV-2/FLU/RSV plus assay is intended as an aid in the diagnosis of influenza from Nasopharyngeal swab specimens and should not be used as a sole basis for treatment. Nasal washings and aspirates are unacceptable for Xpert Xpress SARS-CoV-2/FLU/RSV testing.  Fact Sheet for Patients: EntrepreneurPulse.com.au  Fact Sheet for Healthcare Providers: IncredibleEmployment.be  This test is not yet approved or cleared by the [place] FDA and has been authorized for detection and/or diagnosis of SARS-CoV-2 by FDA under an Emergency Use Authorization (EUA). This EUA will remain in effect (meaning this test can be used) for the duration of the COVID-19 declaration under Section 564(b)(1) of the Act, 21 U.S.C. section 360bbb-3(b)(1), unless the authorization is terminated or revoked.  Performed at [facility], [place] [address], [place], [place] [address]     No current facility-administered medications for this encounter.   Current Outpatient Medications  Medication Sig Dispense Refill   albuterol (VENTOLIN HFA) 108 (90 Base) MCG/ACT inhaler Inhale 2 puffs into the lungs every 6 (six) hours as needed for wheezing or shortness of breath.     budesonide-formoterol (SYMBICORT) 160-4.5 MCG/ACT inhaler Inhale 2 puffs into the lungs 2 (two) times daily.     calcium carbonate (TUMS EX) 750 MG chewable tablet Chew 1,500 mg by mouth 2 (two) times daily as needed for heartburn.     cariprazine (VRAYLAR) 3 MG capsule Take 1 capsule (3 mg total) by mouth daily. 30 capsule    cyclobenzaprine (FLEXERIL) 10 MG tablet Take 10 mg by mouth at bedtime.     diclofenac Sodium (VOLTAREN) 1 % GEL Apply 2 g topically 4 (four) times daily. 100 g 0   escitalopram (LEXAPRO) 5 MG tablet Take 3 tablets (15 mg total) by mouth daily. 30 tablet 0   ferrous sulfate 325 (65 FE) MG tablet Take 1 tablet (325 mg total) by mouth every other day.  15 tablet 0   hydrOXYzine (ATARAX) 25 MG tablet Take 25 mg by mouth 3 (three) times daily as needed for anxiety.     Lidocaine-Menthol (ICY HOT MAX LIDOCAINE EX) Apply 1 Application topically daily as needed (pain).     Multiple Vitamins-Minerals (EMERGEN-C IMMUNE PO) Take 1 packet by mouth daily as needed (Immune support).     neomycin-bacitracin-polymyxin (NEOSPORIN) OINT Apply 1 Application topically 2 (two) times daily.     pantoprazole (PROTONIX) 40 MG tablet TAKE 1 TABLET BY MOUTH DAILY (Patient taking differently: 40 mg daily.) 30 tablet 0   prazosin (MINIPRESS) 1 MG capsule Take 1 capsule (1 mg total) by mouth at bedtime for 7 days. 7 capsule 0   pregabalin (LYRICA) 50 MG capsule Take 1 capsule (50 mg total) by mouth every 8 (eight) hours for 14 days. 42 capsule 0   propranolol (INDERAL) 10 MG tablet Take 1 tablet (10 mg total) by mouth 2 (two) times daily.     QUEtiapine (SEROQUEL) 25 MG tablet Take 1 tablet (25 mg total) by mouth at bedtime. (Patient taking differently: Take 12.5-25 mg by mouth 2 (two) times daily. Take 12.'5mg'$  by mouth in the morning and '25mg'$  at bedtime.  May take an additional 12.'5mg'$  in the afternoon as needed for anxiety.) 30 tablet 0   Psychiatric Specialty Exam:  Presentation  General Appearance: Appropriate for Environment  Eye Contact:Good  Speech:Clear and Coherent  Speech Volume:Normal  Handedness:Right   Mood and Affect  Mood:Euthymic  Affect:Congruent   Thought Process  Thought Processes:Coherent  Descriptions of Associations:Intact  Orientation:Full (Time, Place and Person)  Thought Content:Logical  History of Schizophrenia/Schizoaffective disorder:Yes  Duration of Psychotic Symptoms:Greater than six months  Hallucinations:Hallucinations: None  Ideas of Reference:None  Suicidal Thoughts:Suicidal Thoughts: No  Homicidal Thoughts:Homicidal Thoughts: No   Sensorium  Memory:Immediate  Fair  Judgment:Fair  Insight:Fair   Executive Functions  Concentration:Fair  Attention Span:Fair  [place] of Knowledge:Good  Language:Good   Psychomotor Activity  Psychomotor Activity:Psychomotor Activity: Normal   Assets  Assets:Communication Skills; Leisure Time; Physical Health; Resilience; Social Support    Sleep  Sleep:Sleep: Fair   Physical Exam: Physical Exam Neurological:     Mental Status: She is alert and oriented to person, place, and time.  Psychiatric:        Behavior: Behavior is cooperative.        Thought Content: Thought content normal.    Review of Systems  All other systems reviewed and are negative.  Blood pressure 120/66, pulse 85, temperature 98.3 F (36.8 C), temperature source Oral, resp. rate 18, height '5\' 5"'$  (1.651 m), weight 91.6 kg, SpO2 99 %. Body mass index is 33.61 kg/m.  Medical Decision Making: Patient case reviewed and discussed with Dr. Dwyane Dee.  Inpatient admission to [facility] was offered however the patient has declined.  She is able to contract for safety at this time, and states she would like to be home to celebrate her birthday in 2 days and not in a hospital.  There are concerns for secondary gain and malingering.  She does not meet criteria for IVC, patient is psychiatrically cleared.  - resources added to AVS - Patient instructed to continue to follow up with her OP through [facility] and weekly therapy  Disposition: No evidence of imminent risk to self or others at present.   Patient does not meet criteria for psychiatric inpatient admission. Supportive therapy provided about ongoing stressors. Discussed crisis plan, support from social network, calling 911, coming to the Emergency Department, and calling Suicide Hotline.  Vesta Mixer, NP 10/30/2021 11:22 AM

## 2021-10-30 NOTE — Progress Notes (Addendum)
ADDENDUM  No available beds at [facility] pre Alethia Berthold, MD; CSW has resent referral to multiple out of system facilities. For full list of facilities, see note by [place], LCSW on 10/30/2021. CSW disposition team will follow up with facilities at a later time.   Signed:  Durenda Hurt, MSW, [place], LCAS 10/30/2021 9:37 AM  [facility] LCSW Progress Note   10/30/2021    9:31 AM  [name]   [id number]   Type of Contact and Topic:  Psychiatric Bed Placement     Patient information has been sent to [facility] attending physician via secure chat to review for potential admission. Patient has not yet been accepted at this time. Patient meets inpatient criteria per Weber Cooks, NP.   Situation ongoing, CSW will continue to monitor and update note as more information becomes available.    Signed:  Durenda Hurt, MSW, LCSWA, LCAS 10/30/2021 9:31 AM

## 2021-10-30 NOTE — ED Provider Notes (Signed)
Emergency Medicine Observation Re-evaluation Note  [name] is a 31 y.o. female, seen on rounds today.  Pt initially presented to the ED for complaints of Suicidal Currently, the patient is improved and no longer suicidal.  Physical Exam  BP 107/60 (BP Location: Right Arm)   Pulse 81   Temp 98.6 F (37 C)   Resp 16   Ht 1.651 m ('5\' 5"'$ )   Wt 91.6 kg   SpO2 98%   BMI 33.61 kg/m  Physical Exam General: wdwn Cardiac: rrr Lungs: no distress Psych: denies suicidality or homicidality  ED Course / MDM  EKG:EKG Interpretation  Date/Time:  Sunday October 29 2021 00:03:12 EDT Ventricular Rate:  88 PR Interval:  145 QRS Duration: 89 QT Interval:  371 QTC Calculation: 449 R Axis:   38 Text Interpretation: Sinus rhythm Confirmed by Merrily Pew [phone]) on 10/29/2021 7:13:13 AM  I have reviewed the labs performed to date as well as medications administered while in observation.  Recent changes in the last 24 hours include patient no longer si and wished d/c Psych has seen and concurs.  Plan  Current plan is for d/c home.    Pattricia Boss, MD 10/30/21 [phone]

## 2021-10-31 DIAGNOSIS — F250 Schizoaffective disorder, bipolar type: Secondary | ICD-10-CM | POA: Diagnosis not present

## 2021-10-31 DIAGNOSIS — F4310 Post-traumatic stress disorder, unspecified: Secondary | ICD-10-CM | POA: Diagnosis not present

## 2021-10-31 DIAGNOSIS — F1020 Alcohol dependence, uncomplicated: Secondary | ICD-10-CM | POA: Diagnosis not present

## 2021-10-31 DIAGNOSIS — F603 Borderline personality disorder: Secondary | ICD-10-CM | POA: Diagnosis not present

## 2021-11-01 ENCOUNTER — Emergency Department (HOSPITAL_COMMUNITY)
Admission: EM | Admit: 2021-11-01 | Discharge: 2021-11-01 | Disposition: A | Discharge status: Home / Self Care | Payer: PPO | Attending: Emergency Medicine | Admitting: Emergency Medicine

## 2021-11-01 ENCOUNTER — Ambulatory Visit (INDEPENDENT_AMBULATORY_CARE_PROVIDER_SITE_OTHER)
Admission: EM | Admit: 2021-11-01 | Discharge: 2021-11-03 | Disposition: A | Discharge status: Other Acute Inpatient Hospital | Payer: PPO | Source: Home / Self Care

## 2021-11-01 ENCOUNTER — Other Ambulatory Visit (HOSPITAL_COMMUNITY): Payer: Self-pay

## 2021-11-01 ENCOUNTER — Other Ambulatory Visit: Payer: Self-pay

## 2021-11-01 DIAGNOSIS — R45851 Suicidal ideations: Secondary | ICD-10-CM | POA: Insufficient documentation

## 2021-11-01 DIAGNOSIS — R079 Chest pain, unspecified: Secondary | ICD-10-CM | POA: Diagnosis not present

## 2021-11-01 DIAGNOSIS — F419 Anxiety disorder, unspecified: Secondary | ICD-10-CM | POA: Insufficient documentation

## 2021-11-01 DIAGNOSIS — R000 Tachycardia, unspecified: Secondary | ICD-10-CM | POA: Diagnosis not present

## 2021-11-01 DIAGNOSIS — Z765 Malingerer [conscious simulation]: Secondary | ICD-10-CM | POA: Insufficient documentation

## 2021-11-01 DIAGNOSIS — R0602 Shortness of breath: Secondary | ICD-10-CM | POA: Insufficient documentation

## 2021-11-01 DIAGNOSIS — R002 Palpitations: Secondary | ICD-10-CM | POA: Insufficient documentation

## 2021-11-01 DIAGNOSIS — F603 Borderline personality disorder: Secondary | ICD-10-CM | POA: Insufficient documentation

## 2021-11-01 DIAGNOSIS — Z91148 Patient's other noncompliance with medication regimen for other reason: Secondary | ICD-10-CM

## 2021-11-01 DIAGNOSIS — F840 Autistic disorder: Secondary | ICD-10-CM | POA: Insufficient documentation

## 2021-11-01 DIAGNOSIS — R0789 Other chest pain: Secondary | ICD-10-CM | POA: Diagnosis not present

## 2021-11-01 DIAGNOSIS — R4689 Other symptoms and signs involving appearance and behavior: Secondary | ICD-10-CM

## 2021-11-01 DIAGNOSIS — Z79899 Other long term (current) drug therapy: Secondary | ICD-10-CM | POA: Insufficient documentation

## 2021-11-01 DIAGNOSIS — Z20822 Contact with and (suspected) exposure to covid-19: Secondary | ICD-10-CM | POA: Insufficient documentation

## 2021-11-01 DIAGNOSIS — R42 Dizziness and giddiness: Secondary | ICD-10-CM | POA: Diagnosis not present

## 2021-11-01 MED ORDER — FERROUS SULFATE 325 (65 FE) MG PO TABS
325.0000 mg | ORAL_TABLET | ORAL | Status: DC
  Administered 2021-11-02: 325 mg via ORAL
  Filled 2021-11-01: qty 1

## 2021-11-01 MED ORDER — PANTOPRAZOLE SODIUM 40 MG PO TBEC
40.0000 mg | DELAYED_RELEASE_TABLET | Freq: Every day | ORAL | Status: DC
  Administered 2021-11-02 – 2021-11-03 (×2): 40 mg via ORAL
  Filled 2021-11-01 (×2): qty 1

## 2021-11-01 MED ORDER — LACTATED RINGERS IV BOLUS
1000.0000 mL | Freq: Once | INTRAVENOUS | Status: AC
  Administered 2021-11-01: 1000 mL via INTRAVENOUS

## 2021-11-01 MED ORDER — PRAZOSIN HCL 1 MG PO CAPS
1.0000 mg | ORAL_CAPSULE | Freq: Every day | ORAL | Status: DC
  Administered 2021-11-01 – 2021-11-02 (×2): 1 mg via ORAL
  Filled 2021-11-01 (×2): qty 1

## 2021-11-01 MED ORDER — IBUPROFEN 400 MG PO TABS
400.0000 mg | ORAL_TABLET | Freq: Once | ORAL | Status: AC
  Administered 2021-11-01: 400 mg via ORAL
  Filled 2021-11-01: qty 1

## 2021-11-01 MED ORDER — PROPRANOLOL HCL 10 MG PO TABS
10.0000 mg | ORAL_TABLET | Freq: Two times a day (BID) | ORAL | Status: DC
  Administered 2021-11-01 – 2021-11-03 (×4): 10 mg via ORAL
  Filled 2021-11-01 (×4): qty 1

## 2021-11-01 MED ORDER — ALUM & MAG HYDROXIDE-SIMETH 200-200-20 MG/5ML PO SUSP: 30.0000 mL | ORAL | Status: DC | PRN

## 2021-11-01 MED ORDER — PROPRANOLOL HCL 10 MG PO TABS
10.0000 mg | ORAL_TABLET | Freq: Once | ORAL | Status: AC
  Administered 2021-11-01: 10 mg via ORAL
  Filled 2021-11-01: qty 1

## 2021-11-01 MED ORDER — HYDROXYZINE HCL 25 MG PO TABS
25.0000 mg | ORAL_TABLET | Freq: Three times a day (TID) | ORAL | Status: DC | PRN
  Administered 2021-11-02 (×2): 25 mg via ORAL
  Filled 2021-11-01 (×2): qty 1

## 2021-11-01 MED ORDER — QUETIAPINE FUMARATE 25 MG PO TABS
25.0000 mg | ORAL_TABLET | Freq: Every day | ORAL | Status: DC
  Administered 2021-11-01 – 2021-11-02 (×2): 25 mg via ORAL
  Filled 2021-11-01 (×2): qty 1

## 2021-11-01 MED ORDER — PROPRANOLOL HCL 10 MG PO TABS
10.0000 mg | ORAL_TABLET | Freq: Two times a day (BID) | ORAL | 0 refills | Status: DC
  Filled 2021-11-01: qty 12, 6d supply, fill #0

## 2021-11-01 MED ORDER — PREGABALIN 50 MG PO CAPS
50.0000 mg | ORAL_CAPSULE | Freq: Three times a day (TID) | ORAL | Status: DC
  Administered 2021-11-01 – 2021-11-03 (×5): 50 mg via ORAL
  Filled 2021-11-01 (×5): qty 1

## 2021-11-01 MED ORDER — CYCLOBENZAPRINE HCL 10 MG PO TABS
10.0000 mg | ORAL_TABLET | Freq: Every day | ORAL | Status: DC
  Administered 2021-11-01 – 2021-11-02 (×2): 10 mg via ORAL
  Filled 2021-11-01 (×2): qty 1

## 2021-11-01 MED ORDER — MAGNESIUM HYDROXIDE 400 MG/5ML PO SUSP: 30.0000 mL | Freq: Every day | ORAL | Status: DC | PRN

## 2021-11-01 NOTE — ED Notes (Signed)
For patient into a gown on the monitor did ekg shown to Dr Theora Gianotti patient is resting with call bell in reach

## 2021-11-01 NOTE — ED Triage Notes (Signed)
Pt BIB EMS from home for palpitaitons. Pt reported high heart rate, dizziness, and shortness of breath. Upon EMS arrival, pt's HR 120 while ambulating increasing to 148 with shortness of breath. Pt has run out of propanolol since Saturday.   324 ASA  EMS Vitals 121/79 98% RA HR 100

## 2021-11-01 NOTE — ED Provider Notes (Signed)
[facility] EMERGENCY DEPARTMENT Provider Note   CSN: [id number] Arrival date & time: 11/01/21  7026     History  Chief Complaint  Patient presents with   Palpitations    [name] is a 31 y.o. female.  Patient is a 31 year old female with a history of depression, schizoaffective disorder, bipolar disease, recent admission to the emergency room for overdose who is presenting today due to palpitations.  Patient typically takes 10 mg of propranolol twice daily for what she reports is pots disease.  She reports the pharmacy will not fill her prescription until Tuesday and every time she gets up and tries to do anything her heart will race and she feels short of breath and has some chest pain.  She reports she has not been eating and drinking a whole lot because she has not felt like it.  She denies any suicidal ideation today.  Has any fever, cough or congestion.  She has not no complaints of abdominal pain or diarrhea.  She has had no vomiting.  The history is provided by the patient.  Palpitations      Home Medications Prior to Admission medications   Medication Sig Start Date End Date Taking? Authorizing Provider  propranolol (INDERAL) 10 MG tablet Take 1 tablet (10 mg total) by mouth 2 (two) times daily. 11/01/21  Yes Thierry Dobosz, Loree Fee, MD  albuterol (VENTOLIN HFA) 108 (90 Base) MCG/ACT inhaler Inhale 2 puffs into the lungs every 6 (six) hours as needed for wheezing or shortness of breath.    [provider]  budesonide-formoterol (SYMBICORT) 160-4.5 MCG/ACT inhaler Inhale 2 puffs into the lungs 2 (two) times daily.    [provider]  calcium carbonate (TUMS EX) 750 MG chewable tablet Chew 1,500 mg by mouth 2 (two) times daily as needed for heartburn.    [provider]  cariprazine (VRAYLAR) 3 MG capsule Take 1 capsule (3 mg total) by mouth daily. 07/12/21   Massengill, Ovid Curd, MD  cyclobenzaprine (FLEXERIL) 10 MG tablet Take  10 mg by mouth at bedtime.    [provider]  diclofenac Sodium (VOLTAREN) 1 % GEL Apply 2 g topically 4 (four) times daily. 06/23/21   Harris, Abigail, PA-C  escitalopram (LEXAPRO) 5 MG tablet Take 3 tablets (15 mg total) by mouth daily. 09/05/21   Merrily Brittle, DO  ferrous sulfate 325 (65 FE) MG tablet Take 1 tablet (325 mg total) by mouth every other day. 10/11/21 11/10/21  France Ravens, MD  hydrOXYzine (ATARAX) 25 MG tablet Take 25 mg by mouth 3 (three) times daily as needed for anxiety.    [provider]  Lidocaine-Menthol (ICY HOT MAX LIDOCAINE EX) Apply 1 Application topically daily as needed (pain).    [provider]  Multiple Vitamins-Minerals (EMERGEN-C IMMUNE PO) Take 1 packet by mouth daily as needed (Immune support).    [provider]  neomycin-bacitracin-polymyxin (NEOSPORIN) OINT Apply 1 Application topically 2 (two) times daily. 10/23/21   Massengill, Ovid Curd, MD  pantoprazole (PROTONIX) 40 MG tablet TAKE 1 TABLET BY MOUTH DAILY Patient taking differently: 40 mg daily. 04/26/21   Jeanie Sewer, NP  prazosin (MINIPRESS) 1 MG capsule Take 1 capsule (1 mg total) by mouth at bedtime for 7 days. 07/28/21 11/11/21  Massengill, Ovid Curd, MD  pregabalin (LYRICA) 50 MG capsule Take 1 capsule (50 mg total) by mouth every 8 (eight) hours for 14 days. 10/23/21 11/06/21  Massengill, Ovid Curd, MD  propranolol (INDERAL) 10 MG tablet Take 1 tablet (10  mg total) by mouth 2 (two) times daily. 07/11/21   Massengill, Ovid Curd, MD  QUEtiapine (SEROQUEL) 25 MG tablet Take 1 tablet (25 mg total) by mouth at bedtime. Patient taking differently: Take 12.5-25 mg by mouth 2 (two) times daily. Take 12.'5mg'$  by mouth in the morning and '25mg'$  at bedtime.  May take an additional 12.'5mg'$  in the afternoon as needed for anxiety. 10/23/21 11/22/21  Massengill, Ovid Curd, MD      Allergies    Bee venom, Coconut flavor, Fish allergy, Geodon [ziprasidone hcl], Haloperidol and related, Lithobid [lithium],  Roxicodone [oxycodone], Seroquel [quetiapine], Shellfish allergy, Phenergan [promethazine hcl], Prilosec [omeprazole], Sulfa antibiotics, Tegretol [carbamazepine], Prozac [fluoxetine], Tape, and Tylenol [acetaminophen]    Review of Systems   Review of Systems  Cardiovascular:  Positive for palpitations.    Physical Exam Updated Vital Signs BP 103/62   Pulse 84   Temp 98.3 F (36.8 C) (Oral)   Resp 14   Ht '5\' 4"'$  (1.626 m)   Wt 90.7 kg   SpO2 99%   BMI 34.33 kg/m  Physical Exam Vitals and nursing note reviewed.  Constitutional:      General: She is not in acute distress.    Appearance: She is well-developed.  HENT:     Head: Normocephalic and atraumatic.  Eyes:     Pupils: Pupils are equal, round, and reactive to light.  Cardiovascular:     Rate and Rhythm: Regular rhythm. Tachycardia present.     Heart sounds: Normal heart sounds. No murmur heard.    No friction rub.  Pulmonary:     Effort: Pulmonary effort is normal.     Breath sounds: Normal breath sounds. No wheezing or rales.  Abdominal:     General: Bowel sounds are normal. There is no distension.     Palpations: Abdomen is soft.     Tenderness: There is no abdominal tenderness. There is no guarding or rebound.  Musculoskeletal:        General: No tenderness. Normal range of motion.     Comments: No edema  Skin:    General: Skin is warm and dry.     Findings: No rash.  Neurological:     Mental Status: She is alert and oriented to person, place, and time.     Cranial Nerves: No cranial nerve deficit.  Psychiatric:        Behavior: Behavior normal.     Comments: Calm and cooperative.  Denies SI     ED Results / Procedures / Treatments   Labs (all labs ordered are listed, but only abnormal results are displayed) Labs Reviewed - No data to display  EKG EKG Interpretation  Date/Time:  Wednesday November 01 2021 09:37:51 EDT Ventricular Rate:  106 PR Interval:  130 QRS Duration: 95 QT  Interval:  358 QTC Calculation: 476 R Axis:   67 Text Interpretation: Sinus tachycardia Inferior infarct, old Baseline wander in lead(s) V3 V5 V6 Confirmed by Blanchie Dessert [phone]) on 11/01/2021 9:49:05 AM  Radiology No results found.  Procedures Procedures    Medications Ordered in ED Medications  lactated ringers bolus 1,000 mL (0 mLs Intravenous Stopped 11/01/21 1145)  propranolol (INDERAL) tablet 10 mg (10 mg Oral Given 11/01/21 1031)    ED Course/ Medical Decision Making/ A&P                           Medical Decision Making Risk Prescription drug management.   Pt with multiple medical  problems and comorbidities and presenting today with a complaint that caries a high risk for morbidity and mortality.  Here today with a complaint of palpitations, heart racing, shortness of breath and chest pain when she is trying to be active.  This is all in the setting of not having her propranolol since Saturday.  She typically takes 10 mg twice daily.  She reports she no longer has any because she attempted to overdose on Saturday which is when she was seen in the emergency room.  Patient had medical clearance and denies any psychiatric symptoms at this time but reports she is just not feeling well.  She has no infectious symptoms and low suspicion for acute abdominal process, ACS, pneumothorax or fluid overload.  Suspect patient's symptoms are related to lack of beta-blocker.  I independently interpreted patient's EKG today which shows sinus tachycardia but no other acute abnormalities.  Patient given a oral dose of her propranolol and an IV fluid bolus as she reports decreased oral intake in the last few days.  Her labs which were done 3 days ago showed no acute findings with CBC, CMP, EtOH, Tylenol and salicylates.  12:40 PM Patient is improved, heart rate is improved.  At this time feel that she is stable for discharge.  Patient reports she cannot afford any of her medications and she gets  them from a special pharmacy that charges her very little or nothing.  Spoke with transition of care team and patient was given 6 days of her propranolol until she can fill her prescription again.  Patient will have to get the rest of her prescription filled next week as planned.  She is comfortable with this plan and she was discharged home in good condition.         Final Clinical Impression(s) / ED Diagnoses Final diagnoses:  Palpitations    Rx / DC Orders ED Discharge Orders          Ordered    propranolol (INDERAL) 10 MG tablet  2 times daily        11/01/21 1219              Blanchie Dessert, MD 11/01/21 1302

## 2021-11-01 NOTE — ED Notes (Signed)
Pt sleeping@this time. Breathing even and unlabored. Will continue to monitor for safety 
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
## 2021-11-01 NOTE — ED Provider Notes (Signed)
[facility] Continuous Assessment Admission H&P  Date: 11/02/21 Patient Name: [name] MRN: [id number] Chief Complaint:  Chief Complaint  Patient presents with   suicidal ideation   Hallucinations      Diagnoses:  Final diagnoses:  Suicidal ideation  Manipulative behavior  H/O medication noncompliance  Malingering    HPI: [name],  31 y.o female with a history of borderline personality disorder, chronic suicide disorder, intentional overdose bipolar disorder.  Presented to [facility] via [facility].  Per the patient she to have her medicines, patient stated that her current psychiatric provider with refused to refill her medicine at this time.  NP is unaware of the circumstances but however patient is known to the system for being very manipulative ,patient will have an outburst if she does not get her way. Patient was seen at [facility] this morning and discharge.  Face-to-face observation of patient, patient is alert and oriented x 4, speech is clear, maintaining minimal eye contact.  Patient stated that she is suicidal but no definite plans however I think patient is being malingering at this time.  Patient denies HI, AVH or paranoia.  Denies alcohol or drug use.  Recommend observation  CMP, CBC lab not drawn because patient had lab on 10/29/21  PHQ 2-9:  [place] ED from 06/26/2021 in [place] ED from 05/01/2021 in [facility] ED from 02/03/2020 in [facility]  Thoughts that you would be better off dead, or of hurting yourself in some way Several days Nearly every day Nearly every day  [Phreesia 02/03/2020]  PHQ-9 Total Score '9 17 27       '$ Flowsheet Row ED from 11/01/2021 in [place] [facility] from 10/28/2021 in [place] ED from 10/25/2021 in [place] Error: Q3, 4, or 5 should not be populated when Q2 is No High Risk High Risk        Total Time spent with patient: 20 minutes  Musculoskeletal  Strength & Muscle Tone: within normal limits Gait & Station: normal Patient leans: N/A  Psychiatric Specialty Exam  Presentation General Appearance: Casual  Eye Contact:Fair  Speech:Clear and Coherent  Speech Volume:Normal  Handedness:Right   Mood and Affect  Mood:Anxious  Affect:Constricted   Thought Process  Thought Processes:Irrevelant  Descriptions of Associations:Loose  Orientation:Full (Time, Place and Person)  Thought Content:WDL  Diagnosis of Schizophrenia or Schizoaffective disorder in past: Yes  Duration of Psychotic Symptoms: Greater than six months  Hallucinations:Hallucinations: None  Ideas of Reference:None  Suicidal Thoughts:SI Active Intent and/or Plan: Without Plan SI Passive Intent and/or Plan: Without Intent  Homicidal Thoughts:Homicidal Thoughts: No   Sensorium  Memory:Immediate Fair  Judgment:Poor  Insight:Fair   Executive Functions  Concentration:Poor  Attention Span:Poor  Recall:Poor  Fund of Knowledge:Poor  Language:Good   Psychomotor Activity  Psychomotor Activity:Psychomotor Activity: Normal   Assets  Assets:Desire for Improvement   Sleep  Sleep:Sleep: Fair Number of Hours of Sleep: 5   Nutritional Assessment (For OBS and FBC admissions only) Has the patient had a weight loss or gain of 10 pounds or more in the last 3 months?: Yes Has the patient had a decrease in food intake/or appetite?: No Does the patient have dental problems?: No Does the patient have eating habits or behaviors that may be indicators of an eating disorder including binging or inducing vomiting?: No Has the patient  recently lost weight without trying?: 0 Has the patient been eating poorly because of a decreased appetite?: 0 Malnutrition Screening Tool Score: 0    Physical  Exam HENT:     Head: Normocephalic.     Nose: Nose normal.  Cardiovascular:     Rate and Rhythm: Normal rate.  Pulmonary:     Effort: Pulmonary effort is normal.  Musculoskeletal:        General: Normal range of motion.     Cervical back: Normal range of motion.  Skin:    General: Skin is warm.  Neurological:     General: No focal deficit present.     Mental Status: She is alert.  Psychiatric:        Mood and Affect: Mood normal.        Behavior: Behavior normal.        Thought Content: Thought content normal.        Judgment: Judgment normal.    Review of Systems  Constitutional: Negative.   HENT: Negative.    Eyes: Negative.   Respiratory: Negative.    Cardiovascular: Negative.   Gastrointestinal: Negative.   Genitourinary: Negative.   Musculoskeletal: Negative.   Skin: Negative.   Neurological: Negative.   Endo/Heme/Allergies: Negative.   Psychiatric/Behavioral:  Positive for suicidal ideas. The patient is nervous/anxious.     Blood pressure 109/73, pulse (!) 109, temperature 97.9 F (36.6 C), temperature source Oral, resp. rate 20, SpO2 100 %. There is no height or weight on file to calculate BMI.  Past Psychiatric History: Borderline personality disorder, suicidal ideation, intentional harm to self,  Is the patient at risk to self? Yes  Has the patient been a risk to self in the past 6 months? Yes .    Has the patient been a risk to self within the distant past? Yes   Is the patient a risk to others? No   Has the patient been a risk to others in the past 6 months? No   Has the patient been a risk to others within the distant past? 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    Chronic constipation    Depression    Drug-seeking behavior    Essential tremor    Headache    Overdose of acetaminophen 07/2017   and other  meds   Personality disorder ([place])    Schizo-affective psychosis ([place])    Schizoaffective disorder, bipolar type ([place])    Seizures ([place])    Last seizure December 2017   Sleep apnea     Past Surgical History:  Procedure Laterality Date   MOUTH SURGERY  2009 or 2010    Family History:  Family History  Problem Relation Age of Onset   Mental illness Father    Asthma Father    PDD Brother    Seizures Brother     Social History:  Social History   Socioeconomic History   Marital status: Widowed    Spouse name: Not on file   Number of children: 0   Years of education: Not on file   Highest education level: Not on file  Occupational History   Occupation: disability  Tobacco Use   Smoking status: Former    Packs/day: 0.00    Types: Cigarettes   Smokeless  tobacco: Never   Tobacco comments:    Smoked for 2  years age [age]  Vaping Use   Vaping Use: Never used  Substance and Sexual Activity   Alcohol use: No    Alcohol/week: 1.0 standard drink of alcohol    Types: 1 Standard drinks or equivalent per week    Comment: denies at this time   Drug use: No    Comment: History of cocaine use at age 57 for 4 months   Sexual activity: Not Currently    Birth control/protection: None  Other Topics Concern   Not on file  Social History Narrative   Marital status: Widowed      Children: daughter      Lives: with boyfriend, in two story home      Employment:  Disability      Tobacco: quit smoking; smoked for two years.      Alcohol ;none      Drugs: none   Has not traveled outside of the country.   Right handed         Social Determinants of Health   Financial Resource Strain: Not on file  Food Insecurity: No Food Insecurity (10/18/2021)   Hunger Vital Sign    Worried About Running Out of Food in the Last Year: Never true    Ran Out of Food in the Last Year: Never true  Transportation Needs: No Transportation Needs (10/18/2021)   PRAPARE - Radiographer, therapeutic (Medical): No    Lack of Transportation (Non-Medical): No  Physical Activity: Not on file  Stress: Not on file  Social Connections: Not on file  Intimate Partner Violence: Not At Risk (10/18/2021)   Humiliation, Afraid, Rape, and Kick questionnaire    Fear of Current or Ex-Partner: No    Emotionally Abused: No    Physically Abused: No    Sexually Abused: No    SDOH:  [place]: No Food Insecurity (10/18/2021)  Housing: Medium Risk (10/18/2021)  Transportation Needs: No Transportation Needs (10/18/2021)  Utilities: Not At Risk (10/18/2021)  Alcohol Screen: Low Risk  (10/18/2021)  Depression (PHQ2-9): Medium Risk (06/27/2021)  Tobacco Use: Medium Risk (10/29/2021)    Last Labs:  Admission on 11/01/2021  Component Date Value Ref Range Status   SARS Coronavirus 2 by RT PCR 11/01/2021 NEGATIVE  NEGATIVE Final   Comment: (NOTE) SARS-CoV-2 target nucleic acids are NOT DETECTED.  The SARS-CoV-2 RNA is generally detectable in upper respiratory specimens during the acute phase of infection. The lowest concentration of SARS-CoV-2 viral copies this assay can detect is 138 copies/mL. A negative result does not preclude SARS-Cov-2 infection and should not be used as the sole basis for treatment or other patient management decisions. A negative result may occur with  improper specimen collection/handling, submission of specimen other than nasopharyngeal swab, presence of viral mutation(s) within the areas targeted by this assay, and inadequate number of viral copies(<138 copies/mL). A negative result must be combined with clinical observations, patient history, and epidemiological information. The expected result is Negative.  Fact Sheet for Patients:  EntrepreneurPulse.com.au  Fact Sheet for Healthcare Providers:  IncredibleEmployment.be  This test is no                          t yet approved or cleared by the  [place] FDA and  has been authorized for detection and/or diagnosis of SARS-CoV-2 by FDA under an Emergency  Use Authorization (EUA). This EUA will remain  in effect (meaning this test can be used) for the duration of the COVID-19 declaration under Section 564(b)(1) of the Act, 21 U.S.C.section 360bbb-3(b)(1), unless the authorization is terminated  or revoked sooner.       Influenza A by PCR 11/01/2021 NEGATIVE  NEGATIVE Final   Influenza B by PCR 11/01/2021 NEGATIVE  NEGATIVE Final   Comment: (NOTE) The Xpert Xpress SARS-CoV-2/FLU/RSV plus assay is intended as an aid in the diagnosis of influenza from Nasopharyngeal swab specimens and should not be used as a sole basis for treatment. Nasal washings and aspirates are unacceptable for Xpert Xpress SARS-CoV-2/FLU/RSV testing.  Fact Sheet for Patients: EntrepreneurPulse.com.au  Fact Sheet for Healthcare Providers: IncredibleEmployment.be  This test is not yet approved or cleared by the [place] FDA and has been authorized for detection and/or diagnosis of SARS-CoV-2 by FDA under an Emergency Use Authorization (EUA). This EUA will remain in effect (meaning this test can be used) for the duration of the COVID-19 declaration under Section 564(b)(1) of the Act, 21 U.S.C. section 360bbb-3(b)(1), unless the authorization is terminated or revoked.  Performed at [facility], [place] [address], [place], [place] [address]   Admission on 10/28/2021, Discharged on 10/30/2021  Component Date Value Ref Range Status   Sodium 10/29/2021 138  135 - 145 mmol/L Final   Potassium 10/29/2021 3.8  3.5 - 5.1 mmol/L Final   Chloride 10/29/2021 107  98 - 111 mmol/L Final   CO2 10/29/2021 23  22 - 32 mmol/L Final   Glucose, Bld 10/29/2021 112 (H)  70 - 99 mg/dL Final   Glucose reference range applies only to samples taken after fasting for at least 8 hours.   BUN 10/29/2021 6  6 - 20 mg/dL Final    Creatinine, Ser 10/29/2021 0.70  0.44 - 1.00 mg/dL Final   Calcium 10/29/2021 9.1  8.9 - 10.3 mg/dL Final   Total Protein 10/29/2021 6.4 (L)  6.5 - 8.1 g/dL Final   Albumin 10/29/2021 3.6  3.5 - 5.0 g/dL Final   AST 10/29/2021 17  15 - 41 U/L Final   ALT 10/29/2021 14  0 - 44 U/L Final   Alkaline Phosphatase 10/29/2021 76  38 - 126 U/L Final   Total Bilirubin 10/29/2021 0.3  0.3 - 1.2 mg/dL Final   GFR, Estimated 10/29/2021 >60  >60 mL/min Final   Comment: (NOTE) Calculated using the CKD-EPI Creatinine Equation (2021)    Anion gap 10/29/2021 8  5 - 15 Final   Performed at [facility] [address] [place], [place] [address]   Alcohol, Ethyl (B) 10/29/2021 <10  <10 mg/dL Final   Comment: (NOTE) Lowest detectable limit for serum alcohol is 10 mg/dL.  For medical purposes only. Performed at [facility], [place] [address], [place], [place] [address]    Salicylate Lvl 67/01/4579 <7.0 (L)  7.0 - 30.0 mg/dL Final   Performed at [facility] [address] [place], [place] [address]   Acetaminophen (Tylenol), Serum 10/29/2021 <10 (L)  10 - 30 ug/mL Final   Comment: (NOTE) Therapeutic concentrations vary significantly. A range of 10-30 ug/mL  may be an effective concentration for many patients. However, some  are best treated at concentrations outside of this range. Acetaminophen concentrations >150 ug/mL at 4 hours after ingestion  and >50 ug/mL at 12 hours after ingestion are often associated with  toxic reactions.  Performed at [facility]  [facility], [address], [place], [place] [address]    WBC 10/29/2021 9.5  4.0 - 10.5 K/uL Final   RBC 10/29/2021 4.49  3.87 - 5.11 MIL/uL Final   Hemoglobin 10/29/2021 11.8 (L)  12.0 - 15.0 g/dL Final   HCT 10/29/2021 37.0  36.0 - 46.0 % Final   MCV 10/29/2021 82.4  80.0 - 100.0 fL Final   MCH 10/29/2021 26.3  26.0 - 34.0 pg Final   MCHC 10/29/2021 31.9  30.0 - 36.0 g/dL Final   RDW 10/29/2021 13.7  11.5 - 15.5 % Final    Platelets 10/29/2021 292  150 - 400 K/uL Final   nRBC 10/29/2021 0.0  0.0 - 0.2 % Final   Performed at [place] [address], [place], [place] [address]   Opiates 10/29/2021 NONE DETECTED  NONE DETECTED Final   Cocaine 10/29/2021 NONE DETECTED  NONE DETECTED Final   Benzodiazepines 10/29/2021 NONE DETECTED  NONE DETECTED Final   Amphetamines 10/29/2021 NONE DETECTED  NONE DETECTED Final   Tetrahydrocannabinol 10/29/2021 NONE DETECTED  NONE DETECTED Final   Barbiturates 10/29/2021 NONE DETECTED  NONE DETECTED Final   Comment: (NOTE) DRUG SCREEN FOR MEDICAL PURPOSES ONLY.  IF CONFIRMATION IS NEEDED FOR ANY PURPOSE, NOTIFY LAB WITHIN 5 DAYS.  LOWEST DETECTABLE LIMITS FOR URINE DRUG SCREEN Drug Class                     Cutoff (ng/mL) Amphetamine and metabolites    1000 Barbiturate and metabolites    200 Benzodiazepine                 654 Tricyclics and metabolites     300 Opiates and metabolites        300 Cocaine and metabolites        300 THC                            50 Performed at [facility], [place] [address] [place], [place] [address]    I-stat hCG, quantitative 10/29/2021 <5.0  <5 mIU/mL Final   Comment 3 10/29/2021          Final   Comment:   GEST. AGE      CONC.  (mIU/mL)   <=1 WEEK        5 - 50     2 WEEKS       50 - 500     3 WEEKS       100 - 10,000     4 WEEKS     1,000 - 30,000        FEMALE AND NON-PREGNANT FEMALE:     LESS THAN 5 mIU/mL    Lactic Acid, Venous 10/29/2021 1.8  0.5 - 1.9 mmol/L Final   Performed at [facility], [place] [address] [place], [place] [address]   SARS Coronavirus 2 by RT PCR 10/29/2021 NEGATIVE  NEGATIVE Final   Comment: (NOTE) SARS-CoV-2 target nucleic acids are NOT DETECTED.  The SARS-CoV-2 RNA is generally detectable in upper respiratory specimens during the acute phase of infection. The lowest concentration of SARS-CoV-2 viral copies this assay can detect is 138 copies/mL. A negative result does not  preclude SARS-Cov-2 infection and should not be used as the sole basis for treatment or other patient management decisions. A negative result may occur with  improper specimen collection/handling, submission of specimen other than nasopharyngeal swab, presence  of viral mutation(s) within the areas targeted by this assay, and inadequate number of viral copies(<138 copies/mL). A negative result must be combined with clinical observations, patient history, and epidemiological information. The expected result is Negative.  Fact Sheet for Patients:  EntrepreneurPulse.com.au  Fact Sheet for Healthcare Providers:  IncredibleEmployment.be  This test is no                          t yet approved or cleared by the [place] FDA and  has been authorized for detection and/or diagnosis of SARS-CoV-2 by FDA under an Emergency Use Authorization (EUA). This EUA will remain  in effect (meaning this test can be used) for the duration of the COVID-19 declaration under Section 564(b)(1) of the Act, 21 U.S.C.section 360bbb-3(b)(1), unless the authorization is terminated  or revoked sooner.       Influenza A by PCR 10/29/2021 NEGATIVE  NEGATIVE Final   Influenza B by PCR 10/29/2021 NEGATIVE  NEGATIVE Final   Comment: (NOTE) The Xpert Xpress SARS-CoV-2/FLU/RSV plus assay is intended as an aid in the diagnosis of influenza from Nasopharyngeal swab specimens and should not be used as a sole basis for treatment. Nasal washings and aspirates are unacceptable for Xpert Xpress SARS-CoV-2/FLU/RSV testing.  Fact Sheet for Patients: EntrepreneurPulse.com.au  Fact Sheet for Healthcare Providers: IncredibleEmployment.be  This test is not yet approved or cleared by the [place] FDA and has been authorized for detection and/or diagnosis of SARS-CoV-2 by FDA under an Emergency Use Authorization (EUA). This EUA will remain in  effect (meaning this test can be used) for the duration of the COVID-19 declaration under Section 564(b)(1) of the Act, 21 U.S.C. section 360bbb-3(b)(1), unless the authorization is terminated or revoked.  Performed at [facility], [place] [address] [place],  [address]   Admission on 10/25/2021, Discharged on 10/26/2021  Component Date Value Ref Range Status   SARS Coronavirus 2 by RT PCR 10/25/2021 NEGATIVE  NEGATIVE Final   Comment: (NOTE) SARS-CoV-2 target nucleic acids are NOT DETECTED.  The SARS-CoV-2 RNA is generally detectable in upper respiratory specimens during the acute phase of infection. The lowest concentration of SARS-CoV-2 viral copies this assay can detect is 138 copies/mL. A negative result does not preclude SARS-Cov-2 infection and should not be used as the sole basis for treatment or other patient management decisions. A negative result may occur with  improper specimen collection/handling, submission of specimen other than nasopharyngeal swab, presence of viral mutation(s) within the areas targeted by this assay, and inadequate number of viral copies(<138 copies/mL). A negative result must be combined with clinical observations, patient history, and epidemiological information. The expected result is Negative.  Fact Sheet for Patients:  EntrepreneurPulse.com.au  Fact Sheet for Healthcare Providers:  IncredibleEmployment.be  This test is no                          t yet approved or cleared by the [place] FDA and  has been authorized for detection and/or diagnosis of SARS-CoV-2 by FDA under an Emergency Use Authorization (EUA). This EUA will remain  in effect (meaning this test can be used) for the duration of the COVID-19 declaration under Section 564(b)(1) of the Act, 21 U.S.C.section 360bbb-3(b)(1), unless the authorization is terminated  or revoked sooner.       Influenza A by PCR 10/25/2021  NEGATIVE  NEGATIVE Final   Influenza B by PCR 10/25/2021 NEGATIVE  NEGATIVE Final   Comment: (NOTE) The Xpert Xpress SARS-CoV-2/FLU/RSV plus assay is intended as an aid in the diagnosis of influenza from Nasopharyngeal swab specimens and should not be used as a sole basis for treatment. Nasal washings and aspirates are unacceptable for Xpert Xpress SARS-CoV-2/FLU/RSV testing.  Fact Sheet for Patients: EntrepreneurPulse.com.au  Fact Sheet for Healthcare Providers: IncredibleEmployment.be  This test is not yet approved or cleared by the [place] FDA and has been authorized for detection and/or diagnosis of SARS-CoV-2 by FDA under an Emergency Use Authorization (EUA). This EUA will remain in effect (meaning this test can be used) for the duration of the COVID-19 declaration under Section 564(b)(1) of the Act, 21 U.S.C. section 360bbb-3(b)(1), unless the authorization is terminated or revoked.  Performed at [facility], [place] [address], [place], [place] [address]    POC Amphetamine UR 10/26/2021 None Detected  NONE DETECTED (Cut Off Level 1000 ng/mL) Final   POC Secobarbital (BAR) 10/26/2021 None Detected  NONE DETECTED (Cut Off Level 300 ng/mL) Final   POC Buprenorphine (BUP) 10/26/2021 None Detected  NONE DETECTED (Cut Off Level 10 ng/mL) Final   POC Oxazepam (BZO) 10/26/2021 None Detected  NONE DETECTED (Cut Off Level 300 ng/mL) Final   POC Cocaine UR 10/26/2021 None Detected  NONE DETECTED (Cut Off Level 300 ng/mL) Final   POC Methamphetamine UR 10/26/2021 None Detected  NONE DETECTED (Cut Off Level 1000 ng/mL) Final   POC Morphine 10/26/2021 None Detected  NONE DETECTED (Cut Off Level 300 ng/mL) Final   POC Methadone UR 10/26/2021 None Detected  NONE DETECTED (Cut Off Level 300 ng/mL) Final   POC Oxycodone UR 10/26/2021 None Detected  NONE DETECTED (Cut Off Level 100 ng/mL) Final   POC Marijuana UR 10/26/2021 None Detected  NONE  DETECTED (Cut Off Level 50 ng/mL) Final   SARSCOV2ONAVIRUS 2 AG 10/25/2021 NEGATIVE  NEGATIVE Final   Comment: (NOTE) SARS-CoV-2 antigen NOT DETECTED.   Negative results are presumptive.  Negative results do not preclude SARS-CoV-2 infection and should not be used as the sole basis for treatment or other patient management decisions, including infection  control decisions, particularly in the presence of clinical signs and  symptoms consistent with COVID-19, or in those who have been in contact with the virus.  Negative results must be combined with clinical observations, patient history, and epidemiological information. The expected result is Negative.  Fact Sheet for Patients: HandmadeRecipes.com.cy  Fact Sheet for Healthcare Providers: FuneralLife.at  This test is not yet approved or cleared by the [place] FDA and  has been authorized for detection and/or diagnosis of SARS-CoV-2 by FDA under an Emergency Use Authorization (EUA).  This EUA will remain in effect (meaning this test can be used) for the duration of  the COV                          ID-19 declaration under Section 564(b)(1) of the Act, 21 U.S.C. section 360bbb-3(b)(1), unless the authorization is terminated or revoked sooner.     Preg Test, Ur 10/26/2021 NEGATIVE  NEGATIVE Final   Comment:        THE SENSITIVITY OF THIS METHODOLOGY IS >24 mIU/mL   Admission on 10/18/2021, Discharged on 10/18/2021  Component Date Value Ref Range Status   SARS Coronavirus 2 by RT PCR 10/18/2021 NEGATIVE  NEGATIVE Final   Comment: (NOTE) SARS-CoV-2 target nucleic acids are NOT DETECTED.  The SARS-CoV-2 RNA is generally detectable in upper respiratory  specimens during the acute phase of infection. The lowest concentration of SARS-CoV-2 viral copies this assay can detect is 138 copies/mL. A negative result does not preclude SARS-Cov-2 infection and should not be used as the sole  basis for treatment or other patient management decisions. A negative result may occur with  improper specimen collection/handling, submission of specimen other than nasopharyngeal swab, presence of viral mutation(s) within the areas targeted by this assay, and inadequate number of viral copies(<138 copies/mL). A negative result must be combined with clinical observations, patient history, and epidemiological information. The expected result is Negative.  Fact Sheet for Patients:  EntrepreneurPulse.com.au  Fact Sheet for Healthcare Providers:  IncredibleEmployment.be  This test is no                          t yet approved or cleared by the [place] FDA and  has been authorized for detection and/or diagnosis of SARS-CoV-2 by FDA under an Emergency Use Authorization (EUA). This EUA will remain  in effect (meaning this test can be used) for the duration of the COVID-19 declaration under Section 564(b)(1) of the Act, 21 U.S.C.section 360bbb-3(b)(1), unless the authorization is terminated  or revoked sooner.       Influenza A by PCR 10/18/2021 NEGATIVE  NEGATIVE Final   Influenza B by PCR 10/18/2021 NEGATIVE  NEGATIVE Final   Comment: (NOTE) The Xpert Xpress SARS-CoV-2/FLU/RSV plus assay is intended as an aid in the diagnosis of influenza from Nasopharyngeal swab specimens and should not be used as a sole basis for treatment. Nasal washings and aspirates are unacceptable for Xpert Xpress SARS-CoV-2/FLU/RSV testing.  Fact Sheet for Patients: EntrepreneurPulse.com.au  Fact Sheet for Healthcare Providers: IncredibleEmployment.be  This test is not yet approved or cleared by the [place] FDA and has been authorized for detection and/or diagnosis of SARS-CoV-2 by FDA under an Emergency Use Authorization (EUA). This EUA will remain in effect (meaning this test can be used) for the duration of  the COVID-19 declaration under Section 564(b)(1) of the Act, 21 U.S.C. section 360bbb-3(b)(1), unless the authorization is terminated or revoked.  Performed at [facility], [place] [address] [place], [place] [address]    POC Amphetamine UR 10/18/2021 None Detected  NONE DETECTED (Cut Off Level 1000 ng/mL) Final   POC Secobarbital (BAR) 10/18/2021 None Detected  NONE DETECTED (Cut Off Level 300 ng/mL) Final   POC Buprenorphine (BUP) 10/18/2021 None Detected  NONE DETECTED (Cut Off Level 10 ng/mL) Final   POC Oxazepam (BZO) 10/18/2021 None Detected  NONE DETECTED (Cut Off Level 300 ng/mL) Final   POC Cocaine UR 10/18/2021 None Detected  NONE DETECTED (Cut Off Level 300 ng/mL) Final   POC Methamphetamine UR 10/18/2021 None Detected  NONE DETECTED (Cut Off Level 1000 ng/mL) Final   POC Morphine 10/18/2021 None Detected  NONE DETECTED (Cut Off Level 300 ng/mL) Final   POC Methadone UR 10/18/2021 None Detected  NONE DETECTED (Cut Off Level 300 ng/mL) Final   POC Oxycodone UR 10/18/2021 None Detected  NONE DETECTED (Cut Off Level 100 ng/mL) Final   POC Marijuana UR 10/18/2021 None Detected  NONE DETECTED (Cut Off Level 50 ng/mL) Final   SARSCOV2ONAVIRUS 2 AG 10/18/2021 NEGATIVE  NEGATIVE Final   Comment: (NOTE) SARS-CoV-2 antigen NOT DETECTED.   Negative results are presumptive.  Negative results do not preclude SARS-CoV-2 infection and should not be used as the sole basis for treatment or other patient management decisions, including infection  control decisions, particularly in the presence of clinical signs and  symptoms consistent with COVID-19, or in those who have been in contact with the virus.  Negative results must be combined with clinical observations, patient history, and epidemiological information. The expected result is Negative.  Fact Sheet for Patients: HandmadeRecipes.com.cy  Fact Sheet for Healthcare  Providers: FuneralLife.at  This test is not yet approved or cleared by the [place] FDA and  has been authorized for detection and/or diagnosis of SARS-CoV-2 by FDA under an Emergency Use Authorization (EUA).  This EUA will remain in effect (meaning this test can be used) for the duration of  the COV                          ID-19 declaration under Section 564(b)(1) of the Act, 21 U.S.C. section 360bbb-3(b)(1), unless the authorization is terminated or revoked sooner.     Preg Test, Ur 10/18/2021 NEGATIVE  NEGATIVE Final   Comment:        THE SENSITIVITY OF THIS METHODOLOGY IS >24 mIU/mL   Admission on 10/11/2021, Discharged on 10/14/2021  Component Date Value Ref Range Status   SARS Coronavirus 2 by RT PCR 10/11/2021 NEGATIVE  NEGATIVE Final   Comment: (NOTE) SARS-CoV-2 target nucleic acids are NOT DETECTED.  The SARS-CoV-2 RNA is generally detectable in upper respiratory specimens during the acute phase of infection. The lowest concentration of SARS-CoV-2 viral copies this assay can detect is 138 copies/mL. A negative result does not preclude SARS-Cov-2 infection and should not be used as the sole basis for treatment or other patient management decisions. A negative result may occur with  improper specimen collection/handling, submission of specimen other than nasopharyngeal swab, presence of viral mutation(s) within the areas targeted by this assay, and inadequate number of viral copies(<138 copies/mL). A negative result must be combined with clinical observations, patient history, and epidemiological information. The expected result is Negative.  Fact Sheet for Patients:  EntrepreneurPulse.com.au  Fact Sheet for Healthcare Providers:  IncredibleEmployment.be  This test is no                          t yet approved or cleared by the [place] FDA and  has been authorized for detection and/or  diagnosis of SARS-CoV-2 by FDA under an Emergency Use Authorization (EUA). This EUA will remain  in effect (meaning this test can be used) for the duration of the COVID-19 declaration under Section 564(b)(1) of the Act, 21 U.S.C.section 360bbb-3(b)(1), unless the authorization is terminated  or revoked sooner.       Influenza A by PCR 10/11/2021 NEGATIVE  NEGATIVE Final   Influenza B by PCR 10/11/2021 NEGATIVE  NEGATIVE Final   Comment: (NOTE) The Xpert Xpress SARS-CoV-2/FLU/RSV plus assay is intended as an aid in the diagnosis of influenza from Nasopharyngeal swab specimens and should not be used as a sole basis for treatment. Nasal washings and aspirates are unacceptable for Xpert Xpress SARS-CoV-2/FLU/RSV testing.  Fact Sheet for Patients: EntrepreneurPulse.com.au  Fact Sheet for Healthcare Providers: IncredibleEmployment.be  This test is not yet approved or cleared by the [place] FDA and has been authorized for detection and/or diagnosis of SARS-CoV-2 by FDA under an Emergency Use Authorization (EUA). This EUA will remain in effect (meaning this test can be used) for the duration of the COVID-19 declaration under Section 564(b)(1) of the Act, 21 U.S.C. section 360bbb-3(b)(1), unless the authorization is terminated or  revoked.  Performed at [facility], [place] [address], [place], [place] [address]    Sodium 10/11/2021 137  135 - 145 mmol/L Final   Potassium 10/11/2021 3.6  3.5 - 5.1 mmol/L Final   Chloride 10/11/2021 105  98 - 111 mmol/L Final   CO2 10/11/2021 24  22 - 32 mmol/L Final   Glucose, Bld 10/11/2021 87  70 - 99 mg/dL Final   Glucose reference range applies only to samples taken after fasting for at least 8 hours.   BUN 10/11/2021 11  6 - 20 mg/dL Final   Creatinine, Ser 10/11/2021 0.78  0.44 - 1.00 mg/dL Final   Calcium 10/11/2021 9.1  8.9 - 10.3 mg/dL Final   Total Protein 10/11/2021 6.7  6.5 - 8.1 g/dL Final    Albumin 10/11/2021 3.8  3.5 - 5.0 g/dL Final   AST 10/11/2021 18  15 - 41 U/L Final   ALT 10/11/2021 16  0 - 44 U/L Final   Alkaline Phosphatase 10/11/2021 65  38 - 126 U/L Final   Total Bilirubin 10/11/2021 <0.1 (L)  0.3 - 1.2 mg/dL Final   GFR, Estimated 10/11/2021 >60  >60 mL/min Final   Comment: (NOTE) Calculated using the CKD-EPI Creatinine Equation (2021)    Anion gap 10/11/2021 8  5 - 15 Final   Performed at [facility] [address], [place], [place] [address]   Alcohol, Ethyl (B) 10/11/2021 <10  <10 mg/dL Final   Comment: (NOTE) Lowest detectable limit for serum alcohol is 10 mg/dL.  For medical purposes only. Performed at [facility], [place] [address], [place], [place] [address]    Opiates 10/11/2021 NONE DETECTED  NONE DETECTED Final   Cocaine 10/11/2021 NONE DETECTED  NONE DETECTED Final   Benzodiazepines 10/11/2021 NONE DETECTED  NONE DETECTED Final   Amphetamines 10/11/2021 NONE DETECTED  NONE DETECTED Final   Tetrahydrocannabinol 10/11/2021 NONE DETECTED  NONE DETECTED Final   Barbiturates 10/11/2021 NONE DETECTED  NONE DETECTED Final   Comment: (NOTE) DRUG SCREEN FOR MEDICAL PURPOSES ONLY.  IF CONFIRMATION IS NEEDED FOR ANY PURPOSE, NOTIFY LAB WITHIN 5 DAYS.  LOWEST DETECTABLE LIMITS FOR URINE DRUG SCREEN Drug Class                     Cutoff (ng/mL) Amphetamine and metabolites    1000 Barbiturate and metabolites    200 Benzodiazepine                 277 Tricyclics and metabolites     300 Opiates and metabolites        300 Cocaine and metabolites        300 THC                            50 Performed at [facility], [place] [address] [place], [place] [address]    WBC 10/11/2021 8.2  4.0 - 10.5 K/uL Final   RBC 10/11/2021 4.32  3.87 - 5.11 MIL/uL Final   Hemoglobin 10/11/2021 11.2 (L)  12.0 - 15.0 g/dL Final   HCT 10/11/2021 36.5  36.0 - 46.0 % Final   MCV 10/11/2021 84.5  80.0 - 100.0 fL Final   MCH 10/11/2021 25.9 (L)  26.0 - 34.0  pg Final   MCHC 10/11/2021 30.7  30.0 - 36.0 g/dL Final   RDW 10/11/2021 13.8  11.5 - 15.5 % Final   Platelets 10/11/2021  285  150 - 400 K/uL Final   nRBC 10/11/2021 0.0  0.0 - 0.2 % Final   Neutrophils Relative % 10/11/2021 59  % Final   Neutro Abs 10/11/2021 5.0  1.7 - 7.7 K/uL Final   Lymphocytes Relative 10/11/2021 31  % Final   Lymphs Abs 10/11/2021 2.5  0.7 - 4.0 K/uL Final   Monocytes Relative 10/11/2021 7  % Final   Monocytes Absolute 10/11/2021 0.6  0.1 - 1.0 K/uL Final   Eosinophils Relative 10/11/2021 1  % Final   Eosinophils Absolute 10/11/2021 0.1  0.0 - 0.5 K/uL Final   Basophils Relative 10/11/2021 1  % Final   Basophils Absolute 10/11/2021 0.0  0.0 - 0.1 K/uL Final   Immature Granulocytes 10/11/2021 1  % Final   Abs Immature Granulocytes 10/11/2021 0.04  0.00 - 0.07 K/uL Final   Performed at [facility], [place] [address] [place], [place] [address]   I-stat hCG, quantitative 10/12/2021 <5.0  <5 mIU/mL Final   Comment 3 10/12/2021          Final   Comment:   GEST. AGE      CONC.  (mIU/mL)   <=1 WEEK        5 - 50     2 WEEKS       50 - 500     3 WEEKS       100 - 10,000     4 WEEKS     1,000 - 30,000        FEMALE AND NON-PREGNANT FEMALE:     LESS THAN 5 mIU/mL   Admission on 10/06/2021, Discharged on 10/10/2021  Component Date Value Ref Range Status   Iron 10/07/2021 50  28 - 170 ug/dL Final   TIBC 10/07/2021 361  250 - 450 ug/dL Final   Saturation Ratios 10/07/2021 14  10.4 - 31.8 % Final   UIBC 10/07/2021 311  ug/dL Final   Performed at [facility], [place] [address] [place], [place] [address]   Ferritin 10/07/2021 8 (L)  11 - 307 ng/mL Final   Performed at [facility] [address], [place], [place] [address]   Retic Ct Pct 10/07/2021 1.7  0.4 - 3.1 % Final   RBC. 10/07/2021 4.07  3.87 - 5.11 MIL/uL Final   Retic Count, Absolute 10/07/2021 70.0  19.0 - 186.0 K/uL Final   Immature Retic Fract 10/07/2021 17.1 (H)   2.3 - 15.9 % Final   Performed at [facility], [place] [address], [place], [place] [address]   Cholesterol 10/07/2021 148  0 - 200 mg/dL Final   Triglycerides 10/07/2021 173 (H)  <150 mg/dL Final   HDL 10/07/2021 39 (L)  >40 mg/dL Final   Total CHOL/HDL Ratio 10/07/2021 3.8  RATIO Final   VLDL 10/07/2021 35  0 - 40 mg/dL Final   LDL Cholesterol 10/07/2021 74  0 - 99 mg/dL Final   Comment:        Total Cholesterol/HDL:CHD Risk Coronary Heart Disease Risk Table                     Men   Women  1/2 Average Risk   3.4   3.3  Average Risk       5.0   4.4  2 X Average Risk   9.6   7.1  3 X Average Risk  23.4   11.0  Use the calculated Patient Ratio above and the CHD Risk Table to determine the patient's CHD Risk.        ATP III CLASSIFICATION (LDL):  <100     mg/dL   Optimal  100-129  mg/dL   Near or Above                    Optimal  130-159  mg/dL   Borderline  160-189  mg/dL   High  >190     mg/dL   Very High Performed at [facility] [address] [place], [place] [address]    TSH 10/07/2021 1.511  0.350 - 4.500 uIU/mL Final   Comment: Performed by a 3rd Generation assay with a functional sensitivity of <=0.01 uIU/mL. Performed at [facility], [place] [address] [place], [place] [address]    Hgb A1c MFr Bld 10/07/2021 5.0  4.8 - 5.6 % Final   Comment: (NOTE) Pre diabetes:          5.7%-6.4%  Diabetes:              >6.4%  Glycemic control for   <7.0% adults with diabetes    Mean Plasma Glucose 10/07/2021 96.8  mg/dL Final   Performed at [facility], [place] [address], [place], [place] [address]   SARS Coronavirus 2 by RT PCR 10/08/2021 NEGATIVE  NEGATIVE Final   Comment: (NOTE) SARS-CoV-2 target nucleic acids are NOT DETECTED.  The SARS-CoV-2 RNA is generally detectable in upper respiratory specimens during the acute phase of infection. The lowest concentration of SARS-CoV-2 viral copies this assay can  detect is 138 copies/mL. A negative result does not preclude SARS-Cov-2 infection and should not be used as the sole basis for treatment or other patient management decisions. A negative result may occur with  improper specimen collection/handling, submission of specimen other than nasopharyngeal swab, presence of viral mutation(s) within the areas targeted by this assay, and inadequate number of viral copies(<138 copies/mL). A negative result must be combined with clinical observations, patient history, and epidemiological information. The expected result is Negative.  Fact Sheet for Patients:  EntrepreneurPulse.com.au  Fact Sheet for Healthcare Providers:  IncredibleEmployment.be  This test is no                          t yet approved or cleared by the [place] FDA and  has been authorized for detection and/or diagnosis of SARS-CoV-2 by FDA under an Emergency Use Authorization (EUA). This EUA will remain  in effect (meaning this test can be used) for the duration of the COVID-19 declaration under Section 564(b)(1) of the Act, 21 U.S.C.section 360bbb-3(b)(1), unless the authorization is terminated  or revoked sooner.       Influenza A by PCR 10/08/2021 NEGATIVE  NEGATIVE Final   Influenza B by PCR 10/08/2021 NEGATIVE  NEGATIVE Final   Comment: (NOTE) The Xpert Xpress SARS-CoV-2/FLU/RSV plus assay is intended as an aid in the diagnosis of influenza from Nasopharyngeal swab specimens and should not be used as a sole basis for treatment. Nasal washings and aspirates are unacceptable for Xpert Xpress SARS-CoV-2/FLU/RSV testing.  Fact Sheet for Patients: EntrepreneurPulse.com.au  Fact Sheet for Healthcare Providers: IncredibleEmployment.be  This test is not yet approved or cleared by the [place] FDA and has been authorized for detection and/or diagnosis of SARS-CoV-2 by FDA under an Emergency  Use Authorization (EUA). This EUA will remain in effect (meaning  this test can be used) for the duration of the COVID-19 declaration under Section 564(b)(1) of the Act, 21 U.S.C. section 360bbb-3(b)(1), unless the authorization is terminated or revoked.  Performed at [facility], [place] [address], [place], [place] [address]   Admission on 10/04/2021, Discharged on 10/06/2021  Component Date Value Ref Range Status   WBC 10/04/2021 7.3  4.0 - 10.5 K/uL Final   RBC 10/04/2021 4.19  3.87 - 5.11 MIL/uL Final   Hemoglobin 10/04/2021 10.9 (L)  12.0 - 15.0 g/dL Final   HCT 10/04/2021 33.9 (L)  36.0 - 46.0 % Final   MCV 10/04/2021 80.9  80.0 - 100.0 fL Final   MCH 10/04/2021 26.0  26.0 - 34.0 pg Final   MCHC 10/04/2021 32.2  30.0 - 36.0 g/dL Final   RDW 10/04/2021 13.2  11.5 - 15.5 % Final   Platelets 10/04/2021 124 (L)  150 - 400 K/uL Final   REPEATED TO VERIFY   nRBC 10/04/2021 0.0  0.0 - 0.2 % Final   Neutrophils Relative % 10/04/2021 63  % Final   Neutro Abs 10/04/2021 4.6  1.7 - 7.7 K/uL Final   Lymphocytes Relative 10/04/2021 31  % Final   Lymphs Abs 10/04/2021 2.3  0.7 - 4.0 K/uL Final   Monocytes Relative 10/04/2021 5  % Final   Monocytes Absolute 10/04/2021 0.3  0.1 - 1.0 K/uL Final   Eosinophils Relative 10/04/2021 0  % Final   Eosinophils Absolute 10/04/2021 0.0  0.0 - 0.5 K/uL Final   Basophils Relative 10/04/2021 1  % Final   Basophils Absolute 10/04/2021 0.0  0.0 - 0.1 K/uL Final   Immature Granulocytes 10/04/2021 0  % Final   Abs Immature Granulocytes 10/04/2021 0.01  0.00 - 0.07 K/uL Final   Performed at [facility], [place] [address] [place], [place] [address]   Sodium 10/04/2021 137  135 - 145 mmol/L Final   Potassium 10/04/2021 4.1  3.5 - 5.1 mmol/L Final   Chloride 10/04/2021 107  98 - 111 mmol/L Final   CO2 10/04/2021 26  22 - 32 mmol/L Final   Glucose, Bld 10/04/2021 105 (H)  70 - 99 mg/dL Final   Glucose reference range applies only to  samples taken after fasting for at least 8 hours.   BUN 10/04/2021 <5 (L)  6 - 20 mg/dL Final   Creatinine, Ser 10/04/2021 0.78  0.44 - 1.00 mg/dL Final   Calcium 10/04/2021 8.9  8.9 - 10.3 mg/dL Final   Total Protein 10/04/2021 6.2 (L)  6.5 - 8.1 g/dL Final   Albumin 10/04/2021 3.8  3.5 - 5.0 g/dL Final   AST 10/04/2021 22  15 - 41 U/L Final   ALT 10/04/2021 13  0 - 44 U/L Final   Alkaline Phosphatase 10/04/2021 61  38 - 126 U/L Final   Total Bilirubin 10/04/2021 0.6  0.3 - 1.2 mg/dL Final   GFR, Estimated 10/04/2021 >60  >60 mL/min Final   Comment: (NOTE) Calculated using the CKD-EPI Creatinine Equation (2021)    Anion gap 10/04/2021 4 (L)  5 - 15 Final   Performed at [facility] [address] [place], [place] [address]   Acetaminophen (Tylenol), Serum 10/04/2021 <10 (L)  10 - 30 ug/mL Final   Comment: (NOTE) Therapeutic concentrations vary significantly. A range of 10-30 ug/mL  may be an effective concentration for many patients. However, some  are best treated at concentrations outside of this range. Acetaminophen concentrations >150 ug/mL  at 4 hours after ingestion  and >50 ug/mL at 12 hours after ingestion are often associated with  toxic reactions.  Performed at [facility], [place] [address] [place], [place] [address]    Salicylate Lvl 11/57/2620 <7.0 (L)  7.0 - 30.0 mg/dL Final   Performed at [facility] [address] [place], [place] [address]   Opiates 10/04/2021 NONE DETECTED  NONE DETECTED Final   Cocaine 10/04/2021 NONE DETECTED  NONE DETECTED Final   Benzodiazepines 10/04/2021 NONE DETECTED  NONE DETECTED Final   Amphetamines 10/04/2021 NONE DETECTED  NONE DETECTED Final   Tetrahydrocannabinol 10/04/2021 NONE DETECTED  NONE DETECTED Final   Barbiturates 10/04/2021 NONE DETECTED  NONE DETECTED Final   Comment: (NOTE) DRUG SCREEN FOR MEDICAL PURPOSES ONLY.  IF CONFIRMATION IS NEEDED FOR ANY PURPOSE, NOTIFY LAB WITHIN 5 DAYS.  LOWEST  DETECTABLE LIMITS FOR URINE DRUG SCREEN Drug Class                     Cutoff (ng/mL) Amphetamine and metabolites    1000 Barbiturate and metabolites    200 Benzodiazepine                 416 Tricyclics and metabolites     300 Opiates and metabolites        300 Cocaine and metabolites        300 THC                            50 Performed at [facility], [place] [address] [place], [place] [address]    Alcohol, Ethyl (B) 10/04/2021 <10  <10 mg/dL Final   Comment: (NOTE) Lowest detectable limit for serum alcohol is 10 mg/dL.  For medical purposes only. Performed at [facility], [place] [address], [place], [place] [address]    I-stat hCG, quantitative 10/05/2021 <5.0  <5 mIU/mL Final   Comment 3 10/05/2021          Final   Comment:   GEST. AGE      CONC.  (mIU/mL)   <=1 WEEK        5 - 50     2 WEEKS       50 - 500     3 WEEKS       100 - 10,000     4 WEEKS     1,000 - 30,000        FEMALE AND NON-PREGNANT FEMALE:     LESS THAN 5 mIU/mL    Acetaminophen (Tylenol), Serum 10/05/2021 <10 (L)  10 - 30 ug/mL Final   Comment: (NOTE) Therapeutic concentrations vary significantly. A range of 10-30 ug/mL  may be an effective concentration for many patients. However, some  are best treated at concentrations outside of this range. Acetaminophen concentrations >150 ug/mL at 4 hours after ingestion  and >50 ug/mL at 12 hours after ingestion are often associated with  toxic reactions.  Performed at [facility], [place] [address], [place], [place] [address]    SARS Coronavirus 2 by RT PCR 10/05/2021 NEGATIVE  NEGATIVE Final   Comment: (NOTE) SARS-CoV-2 target nucleic acids are NOT DETECTED.  The SARS-CoV-2 RNA is generally detectable in upper respiratory specimens during the acute phase of infection. The lowest concentration of SARS-CoV-2 viral copies this assay can detect is 138 copies/mL. A negative result does not preclude SARS-Cov-2 infection and should not  be used as  the sole basis for treatment or other patient management decisions. A negative result may occur with  improper specimen collection/handling, submission of specimen other than nasopharyngeal swab, presence of viral mutation(s) within the areas targeted by this assay, and inadequate number of viral copies(<138 copies/mL). A negative result must be combined with clinical observations, patient history, and epidemiological information. The expected result is Negative.  Fact Sheet for Patients:  EntrepreneurPulse.com.au  Fact Sheet for Healthcare Providers:  IncredibleEmployment.be  This test is no                          t yet approved or cleared by the [place] FDA and  has been authorized for detection and/or diagnosis of SARS-CoV-2 by FDA under an Emergency Use Authorization (EUA). This EUA will remain  in effect (meaning this test can be used) for the duration of the COVID-19 declaration under Section 564(b)(1) of the Act, 21 U.S.C.section 360bbb-3(b)(1), unless the authorization is terminated  or revoked sooner.       Influenza A by PCR 10/05/2021 NEGATIVE  NEGATIVE Final   Influenza B by PCR 10/05/2021 NEGATIVE  NEGATIVE Final   Comment: (NOTE) The Xpert Xpress SARS-CoV-2/FLU/RSV plus assay is intended as an aid in the diagnosis of influenza from Nasopharyngeal swab specimens and should not be used as a sole basis for treatment. Nasal washings and aspirates are unacceptable for Xpert Xpress SARS-CoV-2/FLU/RSV testing.  Fact Sheet for Patients: EntrepreneurPulse.com.au  Fact Sheet for Healthcare Providers: IncredibleEmployment.be  This test is not yet approved or cleared by the [place] FDA and has been authorized for detection and/or diagnosis of SARS-CoV-2 by FDA under an Emergency Use Authorization (EUA). This EUA will remain in effect (meaning this test can be used) for the duration of  the COVID-19 declaration under Section 564(b)(1) of the Act, 21 U.S.C. section 360bbb-3(b)(1), unless the authorization is terminated or revoked.  Performed at [facility], [place] [address], [place], [place] [address]   Admission on 09/02/2021, Discharged on 09/04/2021  Component Date Value Ref Range Status   SARS Coronavirus 2 by RT PCR 09/02/2021 NEGATIVE  NEGATIVE Final   Comment: (NOTE) SARS-CoV-2 target nucleic acids are NOT DETECTED.  The SARS-CoV-2 RNA is generally detectable in upper respiratory specimens during the acute phase of infection. The lowest concentration of SARS-CoV-2 viral copies this assay can detect is 138 copies/mL. A negative result does not preclude SARS-Cov-2 infection and should not be used as the sole basis for treatment or other patient management decisions. A negative result may occur with  improper specimen collection/handling, submission of specimen other than nasopharyngeal swab, presence of viral mutation(s) within the areas targeted by this assay, and inadequate number of viral copies(<138 copies/mL). A negative result must be combined with clinical observations, patient history, and epidemiological information. The expected result is Negative.  Fact Sheet for Patients:  EntrepreneurPulse.com.au  Fact Sheet for Healthcare Providers:  IncredibleEmployment.be  This test is no                          t yet approved or cleared by the [place] FDA and  has been authorized for detection and/or diagnosis of SARS-CoV-2 by FDA under an Emergency Use Authorization (EUA). This EUA will remain  in effect (meaning this test can be used) for the duration of the COVID-19 declaration under Section 564(b)(1) of the Act, 21 U.S.C.section 360bbb-3(b)(1), unless  the authorization is terminated  or revoked sooner.       Influenza A by PCR 09/02/2021 NEGATIVE  NEGATIVE Final   Influenza B by PCR 09/02/2021  NEGATIVE  NEGATIVE Final   Comment: (NOTE) The Xpert Xpress SARS-CoV-2/FLU/RSV plus assay is intended as an aid in the diagnosis of influenza from Nasopharyngeal swab specimens and should not be used as a sole basis for treatment. Nasal washings and aspirates are unacceptable for Xpert Xpress SARS-CoV-2/FLU/RSV testing.  Fact Sheet for Patients: EntrepreneurPulse.com.au  Fact Sheet for Healthcare Providers: IncredibleEmployment.be  This test is not yet approved or cleared by the [place] FDA and has been authorized for detection and/or diagnosis of SARS-CoV-2 by FDA under an Emergency Use Authorization (EUA). This EUA will remain in effect (meaning this test can be used) for the duration of the COVID-19 declaration under Section 564(b)(1) of the Act, 21 U.S.C. section 360bbb-3(b)(1), unless the authorization is terminated or revoked.  Performed at [facility], [place] [address] [place], [place] [address]    WBC 09/02/2021 8.5  4.0 - 10.5 K/uL Final   RBC 09/02/2021 4.76  3.87 - 5.11 MIL/uL Final   Hemoglobin 09/02/2021 12.4  12.0 - 15.0 g/dL Final   HCT 09/02/2021 38.4  36.0 - 46.0 % Final   MCV 09/02/2021 80.7  80.0 - 100.0 fL Final   MCH 09/02/2021 26.1  26.0 - 34.0 pg Final   MCHC 09/02/2021 32.3  30.0 - 36.0 g/dL Final   RDW 09/02/2021 13.2  11.5 - 15.5 % Final   Platelets 09/02/2021 295  150 - 400 K/uL Final   nRBC 09/02/2021 0.0  0.0 - 0.2 % Final   Neutrophils Relative % 09/02/2021 56  % Final   Neutro Abs 09/02/2021 4.8  1.7 - 7.7 K/uL Final   Lymphocytes Relative 09/02/2021 36  % Final   Lymphs Abs 09/02/2021 3.1  0.7 - 4.0 K/uL Final   Monocytes Relative 09/02/2021 6  % Final   Monocytes Absolute 09/02/2021 0.5  0.1 - 1.0 K/uL Final   Eosinophils Relative 09/02/2021 1  % Final   Eosinophils Absolute 09/02/2021 0.1  0.0 - 0.5 K/uL Final   Basophils Relative 09/02/2021 1  % Final   Basophils Absolute 09/02/2021 0.0  0.0  - 0.1 K/uL Final   Immature Granulocytes 09/02/2021 0  % Final   Abs Immature Granulocytes 09/02/2021 0.02  0.00 - 0.07 K/uL Final   Performed at [facility], [place] [address] [place], [place] [address]   Sodium 09/02/2021 137  135 - 145 mmol/L Final   Potassium 09/02/2021 3.6  3.5 - 5.1 mmol/L Final   Chloride 09/02/2021 102  98 - 111 mmol/L Final   CO2 09/02/2021 28  22 - 32 mmol/L Final   Glucose, Bld 09/02/2021 92  70 - 99 mg/dL Final   Glucose reference range applies only to samples taken after fasting for at least 8 hours.   BUN 09/02/2021 6  6 - 20 mg/dL Final   Creatinine, Ser 09/02/2021 0.72  0.44 - 1.00 mg/dL Final   Calcium 09/02/2021 10.1  8.9 - 10.3 mg/dL Final   Total Protein 09/02/2021 7.5  6.5 - 8.1 g/dL Final   Albumin 09/02/2021 4.2  3.5 - 5.0 g/dL Final   AST 09/02/2021 17  15 - 41 U/L Final   ALT 09/02/2021 16  0 - 44 U/L Final   Alkaline Phosphatase 09/02/2021 70  38 - 126 U/L Final   Total Bilirubin 09/02/2021  0.4  0.3 - 1.2 mg/dL Final   GFR, Estimated 09/02/2021 >60  >60 mL/min Final   Comment: (NOTE) Calculated using the CKD-EPI Creatinine Equation (2021)    Anion gap 09/02/2021 7  5 - 15 Final   Performed at [facility] [address], [place], [place] [address]   Hgb A1c MFr Bld 09/02/2021 5.0  4.8 - 5.6 % Final   Comment: (NOTE) Pre diabetes:          5.7%-6.4%  Diabetes:              >6.4%  Glycemic control for   <7.0% adults with diabetes    Mean Plasma Glucose 09/02/2021 96.8  mg/dL Final   Performed at [facility] [address], [place], [place] [address]   TSH 09/02/2021 2.382  0.350 - 4.500 uIU/mL Final   Comment: Performed by a 3rd Generation assay with a functional sensitivity of <=0.01 uIU/mL. Performed at [facility], [place] [address], [place], [place] [address]    POC Amphetamine UR 09/02/2021 None Detected  NONE DETECTED (Cut Off Level 1000 ng/mL) Preliminary   POC Secobarbital (BAR) 09/02/2021 None Detected   NONE DETECTED (Cut Off Level 300 ng/mL) Preliminary   POC Buprenorphine (BUP) 09/02/2021 None Detected  NONE DETECTED (Cut Off Level 10 ng/mL) Preliminary   POC Oxazepam (BZO) 09/02/2021 None Detected  NONE DETECTED (Cut Off Level 300 ng/mL) Preliminary   POC Cocaine UR 09/02/2021 None Detected  NONE DETECTED (Cut Off Level 300 ng/mL) Preliminary   POC Methamphetamine UR 09/02/2021 None Detected  NONE DETECTED (Cut Off Level 1000 ng/mL) Preliminary   POC Morphine 09/02/2021 None Detected  NONE DETECTED (Cut Off Level 300 ng/mL) Preliminary   POC Methadone UR 09/02/2021 None Detected  NONE DETECTED (Cut Off Level 300 ng/mL) Preliminary   POC Oxycodone UR 09/02/2021 None Detected  NONE DETECTED (Cut Off Level 100 ng/mL) Preliminary   POC Marijuana UR 09/02/2021 None Detected  NONE DETECTED (Cut Off Level 50 ng/mL) Preliminary   SARSCOV2ONAVIRUS 2 AG 09/02/2021 NEGATIVE  NEGATIVE Final   Comment: (NOTE) SARS-CoV-2 antigen NOT DETECTED.   Negative results are presumptive.  Negative results do not preclude SARS-CoV-2 infection and should not be used as the sole basis for treatment or other patient management decisions, including infection  control decisions, particularly in the presence of clinical signs and  symptoms consistent with COVID-19, or in those who have been in contact with the virus.  Negative results must be combined with clinical observations, patient history, and epidemiological information. The expected result is Negative.  Fact Sheet for Patients: HandmadeRecipes.com.cy  Fact Sheet for Healthcare Providers: FuneralLife.at  This test is not yet approved or cleared by the [place] FDA and  has been authorized for detection and/or diagnosis of SARS-CoV-2 by FDA under an Emergency Use Authorization (EUA).  This EUA will remain in effect (meaning this test can be used) for the duration of  the COV                          ID-19  declaration under Section 564(b)(1) of the Act, 21 U.S.C. section 360bbb-3(b)(1), unless the authorization is terminated or revoked sooner.     Preg Test, Ur 09/02/2021 NEGATIVE  NEGATIVE Final   Comment:        THE SENSITIVITY OF THIS METHODOLOGY IS >24 mIU/mL   Admission on 08/02/2021, Discharged on 08/03/2021  Component Date Value Ref Range  Status   SARS Coronavirus 2 by RT PCR 08/03/2021 NEGATIVE  NEGATIVE Final   Comment: (NOTE) SARS-CoV-2 target nucleic acids are NOT DETECTED.  The SARS-CoV-2 RNA is generally detectable in upper respiratory specimens during the acute phase of infection. The lowest concentration of SARS-CoV-2 viral copies this assay can detect is 138 copies/mL. A negative result does not preclude SARS-Cov-2 infection and should not be used as the sole basis for treatment or other patient management decisions. A negative result may occur with  improper specimen collection/handling, submission of specimen other than nasopharyngeal swab, presence of viral mutation(s) within the areas targeted by this assay, and inadequate number of viral copies(<138 copies/mL). A negative result must be combined with clinical observations, patient history, and epidemiological information. The expected result is Negative.  Fact Sheet for Patients:  EntrepreneurPulse.com.au  Fact Sheet for Healthcare Providers:  IncredibleEmployment.be  This test is no                          t yet approved or cleared by the [place] FDA and  has been authorized for detection and/or diagnosis of SARS-CoV-2 by FDA under an Emergency Use Authorization (EUA). This EUA will remain  in effect (meaning this test can be used) for the duration of the COVID-19 declaration under Section 564(b)(1) of the Act, 21 U.S.C.section 360bbb-3(b)(1), unless the authorization is terminated  or revoked sooner.       Influenza A by PCR 08/03/2021 NEGATIVE  NEGATIVE  Final   Influenza B by PCR 08/03/2021 NEGATIVE  NEGATIVE Final   Comment: (NOTE) The Xpert Xpress SARS-CoV-2/FLU/RSV plus assay is intended as an aid in the diagnosis of influenza from Nasopharyngeal swab specimens and should not be used as a sole basis for treatment. Nasal washings and aspirates are unacceptable for Xpert Xpress SARS-CoV-2/FLU/RSV testing.  Fact Sheet for Patients: EntrepreneurPulse.com.au  Fact Sheet for Healthcare Providers: IncredibleEmployment.be  This test is not yet approved or cleared by the [place] FDA and has been authorized for detection and/or diagnosis of SARS-CoV-2 by FDA under an Emergency Use Authorization (EUA). This EUA will remain in effect (meaning this test can be used) for the duration of the COVID-19 declaration under Section 564(b)(1) of the Act, 21 U.S.C. section 360bbb-3(b)(1), unless the authorization is terminated or revoked.  Performed at [facility], [place] [address], [place], [place] [address]    WBC 08/02/2021 9.1  4.0 - 10.5 K/uL Final   RBC 08/02/2021 4.82  3.87 - 5.11 MIL/uL Final   Hemoglobin 08/02/2021 12.8  12.0 - 15.0 g/dL Final   HCT 08/02/2021 38.8  36.0 - 46.0 % Final   MCV 08/02/2021 80.5  80.0 - 100.0 fL Final   MCH 08/02/2021 26.6  26.0 - 34.0 pg Final   MCHC 08/02/2021 33.0  30.0 - 36.0 g/dL Final   RDW 08/02/2021 13.9  11.5 - 15.5 % Final   Platelets 08/02/2021 345  150 - 400 K/uL Final   nRBC 08/02/2021 0.0  0.0 - 0.2 % Final   Neutrophils Relative % 08/02/2021 64  % Final   Neutro Abs 08/02/2021 5.7  1.7 - 7.7 K/uL Final   Lymphocytes Relative 08/02/2021 31  % Final   Lymphs Abs 08/02/2021 2.8  0.7 - 4.0 K/uL Final   Monocytes Relative 08/02/2021 5  % Final   Monocytes Absolute 08/02/2021 0.5  0.1 - 1.0 K/uL Final   Eosinophils Relative 08/02/2021 0  % Final  Eosinophils Absolute 08/02/2021 0.0  0.0 - 0.5 K/uL Final   Basophils Relative 08/02/2021 0  % Final    Basophils Absolute 08/02/2021 0.0  0.0 - 0.1 K/uL Final   Immature Granulocytes 08/02/2021 0  % Final   Abs Immature Granulocytes 08/02/2021 0.03  0.00 - 0.07 K/uL Final   Performed at [facility], [place] [address] [place], [place] [address]   Sodium 08/02/2021 138  135 - 145 mmol/L Final   Potassium 08/02/2021 3.8  3.5 - 5.1 mmol/L Final   Chloride 08/02/2021 104  98 - 111 mmol/L Final   CO2 08/02/2021 23  22 - 32 mmol/L Final   Glucose, Bld 08/02/2021 90  70 - 99 mg/dL Final   Glucose reference range applies only to samples taken after fasting for at least 8 hours.   BUN 08/02/2021 15  6 - 20 mg/dL Final   Creatinine, Ser 08/02/2021 0.75  0.44 - 1.00 mg/dL Final   Calcium 08/02/2021 10.0  8.9 - 10.3 mg/dL Final   Total Protein 08/02/2021 7.1  6.5 - 8.1 g/dL Final   Albumin 08/02/2021 4.3  3.5 - 5.0 g/dL Final   AST 08/02/2021 21  15 - 41 U/L Final   ALT 08/02/2021 20  0 - 44 U/L Final   Alkaline Phosphatase 08/02/2021 74  38 - 126 U/L Final   Total Bilirubin 08/02/2021 0.5  0.3 - 1.2 mg/dL Final   GFR, Estimated 08/02/2021 >60  >60 mL/min Final   Comment: (NOTE) Calculated using the CKD-EPI Creatinine Equation (2021)    Anion gap 08/02/2021 11  5 - 15 Final   Performed at [facility] [address], [place], [place] [address]   Alcohol, Ethyl (B) 08/02/2021 <10  <10 mg/dL Final   Comment: (NOTE) Lowest detectable limit for serum alcohol is 10 mg/dL.  For medical purposes only. Performed at [facility], [place] [address], [place], [place] [address]    POC Amphetamine UR 08/03/2021 Positive (A)  NONE DETECTED (Cut Off Level 1000 ng/mL) Final   POC Secobarbital (BAR) 08/03/2021 None Detected  NONE DETECTED (Cut Off Level 300 ng/mL) Final   POC Buprenorphine (BUP) 08/03/2021 None Detected  NONE DETECTED (Cut Off Level 10 ng/mL) Final   POC Oxazepam (BZO) 08/03/2021 Positive (A)  NONE DETECTED (Cut Off Level 300 ng/mL) Final   POC Cocaine UR 08/03/2021 None  Detected  NONE DETECTED (Cut Off Level 300 ng/mL) Final   POC Methamphetamine UR 08/03/2021 None Detected  NONE DETECTED (Cut Off Level 1000 ng/mL) Final   POC Morphine 08/03/2021 None Detected  NONE DETECTED (Cut Off Level 300 ng/mL) Final   POC Methadone UR 08/03/2021 None Detected  NONE DETECTED (Cut Off Level 300 ng/mL) Final   POC Oxycodone UR 08/03/2021 None Detected  NONE DETECTED (Cut Off Level 100 ng/mL) Final   POC Marijuana UR 08/03/2021 None Detected  NONE DETECTED (Cut Off Level 50 ng/mL) Final   SARS Coronavirus 2 Ag 08/03/2021 Negative  Negative Preliminary   SARSCOV2ONAVIRUS 2 AG 08/03/2021 NEGATIVE  NEGATIVE Final   Comment: (NOTE) SARS-CoV-2 antigen NOT DETECTED.   Negative results are presumptive.  Negative results do not preclude SARS-CoV-2 infection and should not be used as the sole basis for treatment or other patient management decisions, including infection  control decisions, particularly in the presence of clinical signs and  symptoms consistent with COVID-19, or in those who have been in contact with the virus.  Negative results must be combined  with clinical observations, patient history, and epidemiological information. The expected result is Negative.  Fact Sheet for Patients: HandmadeRecipes.com.cy  Fact Sheet for Healthcare Providers: FuneralLife.at  This test is not yet approved or cleared by the [place] FDA and  has been authorized for detection and/or diagnosis of SARS-CoV-2 by FDA under an Emergency Use Authorization (EUA).  This EUA will remain in effect (meaning this test can be used) for the duration of  the COV                          ID-19 declaration under Section 564(b)(1) of the Act, 21 U.S.C. section 360bbb-3(b)(1), unless the authorization is terminated or revoked sooner.     Preg Test, Ur 08/03/2021 NEGATIVE  NEGATIVE Final   Comment:        THE SENSITIVITY OF THIS METHODOLOGY IS  >24 mIU/mL   Admission on 08/01/2021, Discharged on 08/01/2021  Component Date Value Ref Range Status   Sodium 08/01/2021 137  135 - 145 mmol/L Final   Potassium 08/01/2021 3.7  3.5 - 5.1 mmol/L Final   Chloride 08/01/2021 107  98 - 111 mmol/L Final   CO2 08/01/2021 20 (L)  22 - 32 mmol/L Final   Glucose, Bld 08/01/2021 92  70 - 99 mg/dL Final   Glucose reference range applies only to samples taken after fasting for at least 8 hours.   BUN 08/01/2021 13  6 - 20 mg/dL Final   Creatinine, Ser 08/01/2021 0.63  0.44 - 1.00 mg/dL Final   Calcium 08/01/2021 9.3  8.9 - 10.3 mg/dL Final   GFR, Estimated 08/01/2021 >60  >60 mL/min Final   Comment: (NOTE) Calculated using the CKD-EPI Creatinine Equation (2021)    Anion gap 08/01/2021 10  5 - 15 Final   Performed at [facility], [place] [address], [place], [place] [address]   WBC 08/01/2021 8.0  4.0 - 10.5 K/uL Final   RBC 08/01/2021 4.84  3.87 - 5.11 MIL/uL Final   Hemoglobin 08/01/2021 12.4  12.0 - 15.0 g/dL Final   HCT 08/01/2021 40.1  36.0 - 46.0 % Final   MCV 08/01/2021 82.9  80.0 - 100.0 fL Final   MCH 08/01/2021 25.6 (L)  26.0 - 34.0 pg Final   MCHC 08/01/2021 30.9  30.0 - 36.0 g/dL Final   RDW 08/01/2021 14.0  11.5 - 15.5 % Final   Platelets 08/01/2021 305  150 - 400 K/uL Final   nRBC 08/01/2021 0.0  0.0 - 0.2 % Final   Performed at [facility] [address] [place], [place] [address]   I-stat hCG, quantitative 08/01/2021 <5.0  <5 mIU/mL Final   Comment 3 08/01/2021          Final   Comment:   GEST. AGE      CONC.  (mIU/mL)   <=1 WEEK        5 - 50     2 WEEKS       50 - 500     3 WEEKS       100 - 10,000     4 WEEKS     1,000 - 30,000        FEMALE AND NON-PREGNANT FEMALE:     LESS THAN 5 mIU/mL    Troponin I (High Sensitivity) 08/01/2021 2  <18 ng/L Final   Comment: (NOTE) Elevated high sensitivity troponin I (hsTnI) values and significant  changes across serial measurements  may suggest ACS but many other   chronic and acute conditions are known to elevate hsTnI results.  Refer to the "Links" section for chest pain algorithms and additional  guidance. Performed at [facility], [place] [address], [place], [place] [address]    Color, Urine 08/01/2021 YELLOW  YELLOW Final   APPearance 08/01/2021 HAZY (A)  CLEAR Final   Specific Gravity, Urine 08/01/2021 1.018  1.005 - 1.030 Final   pH 08/01/2021 5.0  5.0 - 8.0 Final   Glucose, UA 08/01/2021 NEGATIVE  NEGATIVE mg/dL Final   Hgb urine dipstick 08/01/2021 NEGATIVE  NEGATIVE Final   Bilirubin Urine 08/01/2021 NEGATIVE  NEGATIVE Final   Ketones, ur 08/01/2021 NEGATIVE  NEGATIVE mg/dL Final   Protein, ur 08/01/2021 NEGATIVE  NEGATIVE mg/dL Final   Nitrite 08/01/2021 NEGATIVE  NEGATIVE Final   Leukocytes,Ua 08/01/2021 NEGATIVE  NEGATIVE Final   Performed at [facility], [place] [address] [place], [place] [address]  There may be more visits with results that are not included.    Allergies: Bee venom, Coconut flavor, Fish allergy, Geodon [ziprasidone hcl], Haloperidol and related, Lithobid [lithium], Roxicodone [oxycodone], Seroquel [quetiapine], Shellfish allergy, Phenergan [promethazine hcl], Prilosec [omeprazole], Sulfa antibiotics, Tegretol [carbamazepine], Prozac [fluoxetine], Tape, and Tylenol [acetaminophen]  PTA Medications: (Not in a hospital admission)   Medical Decision Making  Inpatient observation  Meds ordered this encounter  Medications   alum & mag hydroxide-simeth (MAALOX/MYLANTA) 200-200-20 MG/5ML suspension 30 mL   magnesium hydroxide (MILK OF MAGNESIA) suspension 30 mL   cyclobenzaprine (FLEXERIL) tablet 10 mg   ferrous sulfate tablet 325 mg   hydrOXYzine (ATARAX) tablet 25 mg   pantoprazole (PROTONIX) EC tablet 40 mg   prazosin (MINIPRESS) capsule 1 mg   pregabalin (LYRICA) capsule 50 mg   propranolol (INDERAL) tablet 10 mg   QUEtiapine (SEROQUEL) tablet 25 mg   ibuprofen (ADVIL) tablet 400 mg    Lab Orders          Resp Panel by RT-PCR (Flu A&B, Covid) Anterior Nasal Swab         Pregnancy, urine        Recommendations  Based on my evaluation the patient does not appear to have an emergency medical condition.  [name], NP 11/02/21  2:56 AM

## 2021-11-01 NOTE — Discharge Instructions (Addendum)
Continue taking the propranolol.  Fill the rest of your meds on Tuesday.

## 2021-11-01 NOTE — ED Triage Notes (Signed)
Pt presents with ongoing SI, with a plan to overdose on ibuprofen. Pt was seen at [facility] earlier today for heart palpitations. She reports the pharmacy will not fill her prescription until Tuesday and every time she gets up and tries to do anything her heart will race and she feels short of breath and has some chest pain. Pt also reports overdosing on medication Saturday night. Pt denies HI, substance/alcohol use.

## 2021-11-02 ENCOUNTER — Ambulatory Visit: Payer: PPO | Admitting: Obstetrics and Gynecology

## 2021-11-02 DIAGNOSIS — R45851 Suicidal ideations: Secondary | ICD-10-CM | POA: Diagnosis not present

## 2021-11-02 DIAGNOSIS — R4689 Other symptoms and signs involving appearance and behavior: Secondary | ICD-10-CM

## 2021-11-02 DIAGNOSIS — Z765 Malingerer [conscious simulation]: Secondary | ICD-10-CM | POA: Diagnosis not present

## 2021-11-02 DIAGNOSIS — R002 Palpitations: Secondary | ICD-10-CM | POA: Diagnosis not present

## 2021-11-02 DIAGNOSIS — Z91148 Patient's other noncompliance with medication regimen for other reason: Secondary | ICD-10-CM

## 2021-11-02 LAB — RESP PANEL BY RT-PCR (FLU A&B, COVID) ARPGX2
Influenza A by PCR: NEGATIVE
Influenza B by PCR: NEGATIVE
SARS Coronavirus 2 by RT PCR: NEGATIVE

## 2021-11-02 LAB — POCT PREGNANCY, URINE: Preg Test, Ur: NEGATIVE

## 2021-11-02 MED ORDER — QUETIAPINE FUMARATE 25 MG PO TABS
12.5000 mg | ORAL_TABLET | Freq: Every morning | ORAL | Status: DC
  Administered 2021-11-02 – 2021-11-03 (×2): 12.5 mg via ORAL
  Filled 2021-11-02 (×2): qty 1

## 2021-11-02 MED ORDER — ESCITALOPRAM OXALATE 5 MG PO TABS
15.0000 mg | ORAL_TABLET | Freq: Every day | ORAL | Status: DC
  Administered 2021-11-02 – 2021-11-03 (×2): 15 mg via ORAL
  Filled 2021-11-02 (×2): qty 1

## 2021-11-02 MED ORDER — CARIPRAZINE HCL 3 MG PO CAPS
3.0000 mg | ORAL_CAPSULE | Freq: Every day | ORAL | Status: DC
  Administered 2021-11-02 – 2021-11-03 (×2): 3 mg via ORAL
  Filled 2021-11-02 (×2): qty 1

## 2021-11-02 NOTE — ED Provider Notes (Signed)
FBC/OBS ASAP Discharge Summary  Date and Time: 11/03/2021 8:10 AM  Name: [name]  MRN:  [id number]   Discharge Diagnoses:  Final diagnoses:  Suicidal ideation  Manipulative behavior  H/O medication noncompliance  Malingering   Subjective:   [name] is a 31 year old female with history of schizoaffective bipolar type, anxiety, autism spectrum disorder, ptsd, borderline personality disorder. She is well known to the psychiatric service line and to me, with multiple presentations to the Emergency Department/Urgent Care, suicidal gestures/attempts, and inpatient psychiatric hospitalizations. Her most recent suicidal gesture/attempt was on 10/28/21 when she presented to [facility] following taking "a handful" of her medications. She was at [facility] from 10/29/23-10/30/21, was offered a bed at [facility], although declined, and discharged home. She presented to [facility] on 11/01/21 with palpitations prior to presenting to this facility and medically cleared. Her most recent inpatient psychiatric hospitalization was at [facility] from 10/18/21-10/23/21.   On reassessment today, I point out to [name] that this is the third time I am seeing her this month. She states the reason she is presenting this time is different and that  "things are really, really bad". She states she is currently feeling "really depressed, low".  She states on 10/29/23 she got into an argument with her ex-boyfriend, [name], who she lives with, about putting pictures on the wall of their shared home. She states she wanted to put pictures on the wall and [name] did not. She then became upset and started hearing voices to kill herself. She states she took "a handful" of her medications. She was brought to the emergency room and discharged on the 25th. She states she has been unable to get her medications filled and has been without them. She endorses suicidal ideation with plan to "put a knife through my heart, either that, or run through glass".  She verbalizes she is unable to keep herself safe if discharged today. Pt reports she is current experiencing auditory hallucinations telling her "[name] kill yourself". She does not appear to be responding to internal stimuli.   Pt denies homicidal or violent ideation. She denies visual hallucinations. She denies paranoia. Pt denies recent non-suicidal self injurious behavior, states she last engaged in cutting at her last presentation to this facility on 10/25/21.   Pt reports she does use alcohol, one day every couple of months, 1 mixed drink per occasion. Her last drink was a mixed drink 1 week ago. She denies use of marijuana, crack/cocaine, opioids, other substance use.   Pt is living with her ex-boyfriend [name].    Pt denies access to a firearm or other weapon.   Pt is currently unemployed. She states she had interviews but did not attend them because she was feeling depressed.   Pt is followed by a CST team.   Pt recommended for inpatient psychiatric hospitalization. Pt has been accepted to [facility] for inpatient psychiatric admission. This was discussed with pt who agrees for transfer.  Stay Summary:  Pt is a 31 y/o female presenting to [facility] on 11/01/21. On reassessment, pt endorses SI with plan and intent. She has been accepted to [facility] for inpatient psychiatric admission.  Total Time spent with patient: 30 minutes  Past Psychiatric History:  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 fragile X syndrome  Chronic constipation    Depression    Drug-seeking behavior    Essential tremor    Headache    Overdose of acetaminophen 07/2017   and other meds   Personality disorder ([place])    Schizo-affective psychosis ([place])    Schizoaffective disorder, bipolar type ([place])    Seizures ([place])    Last seizure December 2017   Sleep apnea     Past Surgical History:   Procedure Laterality Date   MOUTH SURGERY  2009 or 2010   Family History:  Family History  Problem Relation Age of Onset   Mental illness Father    Asthma Father    PDD Brother    Seizures Brother    Family Psychiatric History:  Social History:  Social History   Substance and Sexual Activity  Alcohol Use No   Alcohol/week: 1.0 standard drink of alcohol   Types: 1 Standard drinks or equivalent per week   Comment: denies at this time     Social History   Substance and Sexual Activity  Drug Use No   Comment: History of cocaine use at age 28 for 4 months    Social History   Socioeconomic History   Marital status: Widowed    Spouse name: Not on file   Number of children: 0   Years of education: Not on file   Highest education level: Not on file  Occupational History   Occupation: disability  Tobacco Use   Smoking status: Former    Packs/day: 0.00    Types: Cigarettes   Smokeless tobacco: Never   Tobacco comments:    Smoked for 2  years age [age]  Vaping Use   Vaping Use: Never used  Substance and Sexual Activity   Alcohol use: No    Alcohol/week: 1.0 standard drink of alcohol    Types: 1 Standard drinks or equivalent per week    Comment: denies at this time   Drug use: No    Comment: History of cocaine use at age 48 for 4 months   Sexual activity: Not Currently    Birth control/protection: None  Other Topics Concern   Not on file  Social History Narrative   Marital status: Widowed      Children: daughter      Lives: with boyfriend, in two story home      Employment:  Disability      Tobacco: quit smoking; smoked for two years.      Alcohol ;none      Drugs: none   Has not traveled outside of the country.   Right handed         Social Determinants of Health   Financial Resource Strain: Not on file  Food Insecurity: No Food Insecurity (10/18/2021)   Hunger Vital Sign    Worried About Running Out of Food in the Last Year: Never true    Ran Out of  Food in the Last Year: Never true  Transportation Needs: No Transportation Needs (10/18/2021)   PRAPARE - Hydrologist (Medical): No    Lack of Transportation (Non-Medical): No  Physical Activity: Not on file  Stress: Not on file  Social Connections: Not on file   SDOH:  [place]: No Food Insecurity (10/18/2021)  Housing: Medium Risk (10/18/2021)  Transportation Needs: No Transportation Needs (10/18/2021)  Utilities: Not At Risk (10/18/2021)  Alcohol Screen: Low Risk  (10/18/2021)  Depression (PHQ2-9): Medium Risk (06/27/2021)  Tobacco Use:  Medium Risk (10/29/2021)    Tobacco Cessation:    Current Medications:  Current Facility-Administered Medications  Medication Dose Route Frequency Provider Last Rate Last Admin   alum & mag hydroxide-simeth (MAALOX/MYLANTA) 200-200-20 MG/5ML suspension 30 mL  30 mL Oral Q4H PRN [name], NP       cariprazine (VRAYLAR) capsule 3 mg  3 mg Oral Daily Tharon Aquas, NP   3 mg at 11/02/21 1007   cyclobenzaprine (FLEXERIL) tablet 10 mg  10 mg Oral Dorie Rank, NP   10 mg at 11/02/21 2129   escitalopram (LEXAPRO) tablet 15 mg  15 mg Oral Daily Tharon Aquas, NP   15 mg at 11/02/21 1007   ferrous sulfate tablet 325 mg  325 mg Oral Dorothea Ogle, NP   325 mg at 11/02/21 9622   hydrOXYzine (ATARAX) tablet 25 mg  25 mg Oral TID PRN [name], NP   25 mg at 11/02/21 2129   magnesium hydroxide (MILK OF MAGNESIA) suspension 30 mL  30 mL Oral Daily PRN [name], NP       pantoprazole (PROTONIX) EC tablet 40 mg  40 mg Oral Daily [name], NP   40 mg at 11/02/21 2979   prazosin (MINIPRESS) capsule 1 mg  1 mg Oral QHS [name], NP   1 mg at 11/02/21 2130   pregabalin (LYRICA) capsule 50 mg  50 mg Oral Q8H [name], NP   50 mg at 11/02/21 2129   propranolol (INDERAL) tablet 10 mg  10 mg Oral BID [name], NP   10 mg at 11/02/21 2128   QUEtiapine (SEROQUEL)  tablet 12.5 mg  12.5 mg Oral q morning Tharon Aquas, NP   12.5 mg at 11/02/21 8921   QUEtiapine (SEROQUEL) tablet 25 mg  25 mg Oral Dorie Rank, NP   25 mg at 11/02/21 2129   Current Outpatient Medications  Medication Sig Dispense Refill   albuterol (VENTOLIN HFA) 108 (90 Base) MCG/ACT inhaler Inhale 2 puffs into the lungs every 6 (six) hours as needed for wheezing or shortness of breath.     budesonide-formoterol (SYMBICORT) 160-4.5 MCG/ACT inhaler Inhale 2 puffs into the lungs 2 (two) times daily.     calcium carbonate (TUMS EX) 750 MG chewable tablet Chew 1,500 mg by mouth 2 (two) times daily as needed for heartburn.     cariprazine (VRAYLAR) 3 MG capsule Take 1 capsule (3 mg total) by mouth daily. 30 capsule    cyclobenzaprine (FLEXERIL) 10 MG tablet Take 10 mg by mouth at bedtime.     diclofenac Sodium (VOLTAREN) 1 % GEL Apply 2 g topically 4 (four) times daily. 100 g 0   escitalopram (LEXAPRO) 5 MG tablet Take 3 tablets (15 mg total) by mouth daily. 30 tablet 0   ferrous sulfate 325 (65 FE) MG tablet Take 1 tablet (325 mg total) by mouth every other day. 15 tablet 0   hydrOXYzine (ATARAX) 25 MG tablet Take 25 mg by mouth 3 (three) times daily as needed for anxiety.     Lidocaine-Menthol (ICY HOT MAX LIDOCAINE EX) Apply 1 Application topically daily as needed (pain).     Multiple Vitamins-Minerals (EMERGEN-C IMMUNE PO) Take 1 packet by mouth daily as needed (Immune support).     neomycin-bacitracin-polymyxin (NEOSPORIN) OINT Apply 1 Application topically 2 (two) times daily.     pantoprazole (PROTONIX) 40 MG tablet TAKE 1 TABLET BY MOUTH DAILY (Patient taking differently: 40 mg daily.) 30 tablet 0  prazosin (MINIPRESS) 1 MG capsule Take 1 capsule (1 mg total) by mouth at bedtime for 7 days. 7 capsule 0   pregabalin (LYRICA) 50 MG capsule Take 1 capsule (50 mg total) by mouth every 8 (eight) hours for 14 days. 42 capsule 0   propranolol (INDERAL) 10 MG tablet Take 1 tablet (10  mg total) by mouth 2 (two) times daily. 12 tablet 0   QUEtiapine (SEROQUEL) 25 MG tablet Take 1 tablet (25 mg total) by mouth at bedtime. (Patient taking differently: Take 12.5-25 mg by mouth in the morning, at noon, and at bedtime. Take half a tablet in the morning and afternoon and then one tablet at bedtime.) 30 tablet 0    PTA Medications: (Not in a hospital admission)      06/27/2021    3:18 AM 05/01/2021    2:16 AM 02/03/2020    2:24 AM  Depression screen PHQ 2/9  Decreased Interest '1 2 3  '$ Down, Depressed, Hopeless '2 3 3  '$ PHQ - 2 Score '3 5 6  '$ Altered sleeping 0 0 3  Tired, decreased energy '1 1 3  '$ Change in appetite 0 1 3  Feeling bad or failure about yourself  '1 3 3  '$ Trouble concentrating '2 3 3  '$ Moving slowly or fidgety/restless '1 1 3  '$ Suicidal thoughts '1 3 3  '$ PHQ-9 Score '9 17 27  '$ Difficult doing work/chores Somewhat difficult Extremely dIfficult     Flowsheet Row ED from 11/01/2021 in [place] [facility] from 10/28/2021 in [facility] from 10/25/2021 in [place] Error: Q3, 4, or 5 should not be populated when Q2 is No High Risk High Risk       Musculoskeletal  Strength & Muscle Tone: within normal limits Gait & Station: normal Patient leans: N/A  Psychiatric Specialty Exam  Presentation  General Appearance: Appropriate for Environment; Casual; Fairly Groomed  Eye Contact:Fair  Speech:Clear and Coherent; Normal Rate  Speech Volume:Normal  Handedness:Right   Mood and Affect  Mood:Depressed  Affect:Congruent   Thought Process  Thought Processes:Coherent; Goal Directed; Linear  Descriptions of Associations:Intact  Orientation:Full (Time, Place and Person)  Thought Content:Logical  Diagnosis of Schizophrenia or Schizoaffective disorder in past: Yes  Duration of Psychotic Symptoms: Greater than six months    Hallucinations:Hallucinations: Auditory Description of Auditory Hallucinations: "Liel kill yourself"  Ideas of Reference:None  Suicidal Thoughts:Suicidal Thoughts: Yes, Active SI Active Intent and/or Plan: With Plan; With Intent  Homicidal Thoughts:Homicidal Thoughts: No   Sensorium  Memory:Immediate Good  Judgment:Poor  Insight:Shallow   Executive Functions  Concentration:Fair  Attention Span:Fair  [place]  Language:Good   Psychomotor Activity  Psychomotor Activity:Psychomotor Activity: Normal   Assets  Assets:Communication Skills; Desire for Improvement; Financial Resources/Insurance; Housing; Vocational/Educational   Sleep  Sleep:Sleep: Fair Number of Hours of Sleep: 6   No data recorded   Physical Exam  Physical Exam Constitutional:      Appearance: Normal appearance.  Cardiovascular:     Rate and Rhythm: Normal rate.  Pulmonary:     Effort: Pulmonary effort is normal.  Neurological:     Mental Status: She is alert and oriented to person, place, and time.  Psychiatric:        Attention and Perception: Attention normal. She perceives auditory hallucinations.        Mood and Affect: Mood is depressed.        Speech: Speech  normal.        Behavior: Behavior normal. Behavior is cooperative.        Thought Content: Thought content includes suicidal ideation. Thought content includes suicidal plan.    Review of Systems  Constitutional:  Negative for chills and fever.  Respiratory:  Negative for shortness of breath.   Cardiovascular:  Negative for chest pain and palpitations.  Gastrointestinal:  Negative for abdominal pain.  Neurological:  Negative for headaches.  Psychiatric/Behavioral:  Positive for depression and suicidal ideas.    Blood pressure (!) 107/53, pulse 92, temperature 98.6 F (37 C), temperature source Oral, resp. rate 18, SpO2 100 %. There is no height or weight on file to calculate BMI.  Demographic  Factors:  Divorced or widowed, Caucasian, and Unemployed  Loss Factors: Financial problems/change in socioeconomic status  Historical Factors: Prior suicide attempts and Impulsivity  Risk Reduction Factors:   Living with another person, especially a relative and Positive social support  Continued Clinical Symptoms:  Previous Psychiatric Diagnoses and Treatments  Cognitive Features That Contribute To Risk:  None    Suicide Risk:  Severe:  Frequent, intense, and enduring suicidal ideation, specific plan, no subjective intent, but some objective markers of intent (i.e., choice of lethal method), the method is accessible, some limited preparatory behavior, evidence of impaired self-control, severe dysphoria/symptomatology, multiple risk factors present, and few if any protective factors, particularly a lack of social support.  Plan Of Care/Follow-up recommendations:  Inpatient psychiatric hospitalization  Disposition:  Transfer to [place], NP 11/03/2021, 8:10 AM

## 2021-11-02 NOTE — ED Notes (Signed)
Pt sleeping@this time. Breathing even and unlabored. Will continue to monitor for safety 

## 2021-11-02 NOTE — Progress Notes (Signed)
Inpatient Behavioral Health Placement  Pt meets inpatient criteria per Tharon Aquas, NP. There are no available beds at [facility] per [facility].  Referral was sent to the following facilities;    Destination Service Provider Address Phone Fax  [facility]  [address] [place] [address] [phone]  [place], [place] [address] [phone]  [facility]  [name] [address] [place] [address] [place]  [facility]  [address] [place] [address] [phone]  [facility]  [address], [place] [address] [phone]  [place] [place] [address] [phone]  [facility]  [address] [place] [address] [phone]  [facility]  [address] [place], [place] [address] [phone] [facility]  [place], [place] [address] [phone] [place]  [address] [place] [address] [phone]  [facility]  [address] [place]  [address] [phone]  [facility]  [address] [place] [address] [phone]  [facility]  [address] [place] [address] [phone]  [facility]  [address], [place] [address] [phone] [place]  [address] [place] [address] [phone]   Situation ongoing,  CSW will follow up.   [name], MSW, LCSWA 11/02/2021  @ 2:35 PM

## 2021-11-02 NOTE — ED Notes (Signed)
Pt A&O x 4, anxious & irritable.  Requesting her Seroquel & Lyrica.  Monitoring for safety.  Pending [facility] in AM.  Pt is Voluntary.

## 2021-11-02 NOTE — ED Notes (Signed)
Pt sleeping at present, no distress noted.  Monitoring for safety. 

## 2021-11-02 NOTE — ED Notes (Signed)
Pt stated to this nurse when questioned if she was SI/HI/AVH. "Yes, I am suicidal. I have a plan to jump out of a window or to stab myself in the heart". Pt stated, "The voices are telling me to kill myself". Pt has requested Seroquel 12.5 mg this morning and Vraylar. Stated that she takes these meds every morning. Informed provider and received order for both. Pt accepted scheduled meds w/o difficulty. Pt currenlty resting on pull out bed/chair in no distress. Safety maintained and will continue to monitor.

## 2021-11-02 NOTE — Progress Notes (Addendum)
Pt was accepted to [facility] 11/03/21; Main Campus  Pt meets inpatient criteria per Tharon Aquas, NP  Attending Physician will be Dr. Jonelle Sports  Report can be called to: [phone] -Pager must leave return phone number to receive a call back.   Pt can arrive after 9:00am  Care Team notified: Tharon Aquas, NP, and Leota Jacobsen, LPN  Nadara Mode, [place] 11/02/2021 @ 2:42 PM

## 2021-11-03 DIAGNOSIS — F840 Autistic disorder: Secondary | ICD-10-CM | POA: Diagnosis not present

## 2021-11-03 DIAGNOSIS — Z9152 Personal history of nonsuicidal self-harm: Secondary | ICD-10-CM | POA: Diagnosis not present

## 2021-11-03 DIAGNOSIS — M1990 Unspecified osteoarthritis, unspecified site: Secondary | ICD-10-CM | POA: Diagnosis not present

## 2021-11-03 DIAGNOSIS — Z91410 Personal history of adult physical and sexual abuse: Secondary | ICD-10-CM | POA: Diagnosis not present

## 2021-11-03 DIAGNOSIS — F4310 Post-traumatic stress disorder, unspecified: Secondary | ICD-10-CM | POA: Diagnosis not present

## 2021-11-03 DIAGNOSIS — J45909 Unspecified asthma, uncomplicated: Secondary | ICD-10-CM | POA: Diagnosis not present

## 2021-11-03 DIAGNOSIS — Z634 Disappearance and death of family member: Secondary | ICD-10-CM | POA: Diagnosis not present

## 2021-11-03 DIAGNOSIS — M797 Fibromyalgia: Secondary | ICD-10-CM | POA: Diagnosis not present

## 2021-11-03 DIAGNOSIS — K219 Gastro-esophageal reflux disease without esophagitis: Secondary | ICD-10-CM | POA: Diagnosis not present

## 2021-11-03 DIAGNOSIS — R45851 Suicidal ideations: Secondary | ICD-10-CM | POA: Diagnosis not present

## 2021-11-03 DIAGNOSIS — F250 Schizoaffective disorder, bipolar type: Secondary | ICD-10-CM | POA: Diagnosis not present

## 2021-11-03 DIAGNOSIS — F603 Borderline personality disorder: Secondary | ICD-10-CM | POA: Diagnosis not present

## 2021-11-03 DIAGNOSIS — Z9151 Personal history of suicidal behavior: Secondary | ICD-10-CM | POA: Diagnosis not present

## 2021-11-03 DIAGNOSIS — Z91148 Patient's other noncompliance with medication regimen for other reason: Secondary | ICD-10-CM | POA: Diagnosis not present

## 2021-11-03 DIAGNOSIS — Z62810 Personal history of physical and sexual abuse in childhood: Secondary | ICD-10-CM | POA: Diagnosis not present

## 2021-11-03 DIAGNOSIS — F1010 Alcohol abuse, uncomplicated: Secondary | ICD-10-CM | POA: Diagnosis not present

## 2021-11-03 DIAGNOSIS — F411 Generalized anxiety disorder: Secondary | ICD-10-CM | POA: Diagnosis not present

## 2021-11-03 DIAGNOSIS — R002 Palpitations: Secondary | ICD-10-CM | POA: Diagnosis not present

## 2021-11-03 NOTE — Progress Notes (Signed)
Received Sweet this AM asleep in her chair bed, she woke up,ate breakfast and returned to bed. Report was given to nurse Morey Hummingbird at [facility] at [phone] hrs. She was medicated prior to her departure. Safe Transport was called at 0903 hrs.

## 2021-11-03 NOTE — ED Notes (Signed)
Patient continues to beat on wall above her bed. Patient wok up other patients from their sleep. Patient was asked to stop beating on walls aeb patient next to her making request as well as staff. Patient is irritable and not easily redirectable. Patient is safe on unit with continued monitoring.

## 2021-11-03 NOTE — ED Notes (Signed)
Pt sleeping at present, no distress noted.  Monitoring for safety. 

## 2021-11-03 NOTE — Progress Notes (Addendum)
Safe Transport arrived at 0934 hrs and [name] was transported without incident with her personal belongings and the required paperwork.

## 2021-11-03 NOTE — ED Provider Notes (Signed)
On reassessment, pt denies VI/HI, AVH, paranoia. She continues to endorse suicidal ideation. She denies plan at this time. When asked about intent, she is unable to verbally contract to safety for herself. Reviewed plan for transfer to [facility] today. She verbalizes understanding of plan and agrees to transfer to [facility].

## 2021-11-09 DIAGNOSIS — R5383 Other fatigue: Secondary | ICD-10-CM | POA: Diagnosis not present

## 2021-11-09 DIAGNOSIS — Z79899 Other long term (current) drug therapy: Secondary | ICD-10-CM | POA: Diagnosis not present

## 2021-11-09 DIAGNOSIS — M129 Arthropathy, unspecified: Secondary | ICD-10-CM | POA: Diagnosis not present

## 2021-11-09 DIAGNOSIS — E7800 Pure hypercholesterolemia, unspecified: Secondary | ICD-10-CM | POA: Diagnosis not present

## 2021-11-09 DIAGNOSIS — Z0000 Encounter for general adult medical examination without abnormal findings: Secondary | ICD-10-CM | POA: Diagnosis not present

## 2021-11-09 DIAGNOSIS — F250 Schizoaffective disorder, bipolar type: Secondary | ICD-10-CM | POA: Diagnosis not present

## 2021-11-09 DIAGNOSIS — R030 Elevated blood-pressure reading, without diagnosis of hypertension: Secondary | ICD-10-CM | POA: Diagnosis not present

## 2021-11-09 DIAGNOSIS — Z131 Encounter for screening for diabetes mellitus: Secondary | ICD-10-CM | POA: Diagnosis not present

## 2021-11-09 DIAGNOSIS — Z1159 Encounter for screening for other viral diseases: Secondary | ICD-10-CM | POA: Diagnosis not present

## 2021-11-09 DIAGNOSIS — F22 Delusional disorders: Secondary | ICD-10-CM | POA: Diagnosis not present

## 2021-11-09 DIAGNOSIS — Z6835 Body mass index (BMI) 35.0-35.9, adult: Secondary | ICD-10-CM | POA: Diagnosis not present

## 2021-11-09 DIAGNOSIS — M797 Fibromyalgia: Secondary | ICD-10-CM | POA: Diagnosis not present

## 2021-11-28 DIAGNOSIS — F250 Schizoaffective disorder, bipolar type: Secondary | ICD-10-CM | POA: Diagnosis not present

## 2021-11-28 DIAGNOSIS — F4310 Post-traumatic stress disorder, unspecified: Secondary | ICD-10-CM | POA: Diagnosis not present

## 2021-11-28 DIAGNOSIS — F1020 Alcohol dependence, uncomplicated: Secondary | ICD-10-CM | POA: Diagnosis not present

## 2021-11-28 DIAGNOSIS — F603 Borderline personality disorder: Secondary | ICD-10-CM | POA: Diagnosis not present

## 2021-11-29 ENCOUNTER — Encounter (HOSPITAL_COMMUNITY): Payer: Self-pay

## 2021-11-29 ENCOUNTER — Emergency Department (HOSPITAL_COMMUNITY)
Admission: EM | Admit: 2021-11-29 | Discharge: 2021-11-29 | Discharge status: Against Medical Advice | Payer: PPO | Attending: Emergency Medicine | Admitting: Emergency Medicine

## 2021-11-29 ENCOUNTER — Other Ambulatory Visit: Payer: Self-pay

## 2021-11-29 ENCOUNTER — Emergency Department (HOSPITAL_COMMUNITY): Payer: PPO

## 2021-11-29 DIAGNOSIS — R42 Dizziness and giddiness: Secondary | ICD-10-CM | POA: Insufficient documentation

## 2021-11-29 DIAGNOSIS — Z5321 Procedure and treatment not carried out due to patient leaving prior to being seen by health care provider: Secondary | ICD-10-CM | POA: Diagnosis not present

## 2021-11-29 DIAGNOSIS — R079 Chest pain, unspecified: Secondary | ICD-10-CM | POA: Insufficient documentation

## 2021-11-29 DIAGNOSIS — R0789 Other chest pain: Secondary | ICD-10-CM | POA: Diagnosis not present

## 2021-11-29 DIAGNOSIS — R110 Nausea: Secondary | ICD-10-CM | POA: Diagnosis not present

## 2021-11-29 LAB — CBC WITH DIFFERENTIAL/PLATELET
Abs Immature Granulocytes: 0.03 10*3/uL (ref 0.00–0.07)
Basophils Absolute: 0.1 10*3/uL (ref 0.0–0.1)
Basophils Relative: 1 %
Eosinophils Absolute: 0.1 10*3/uL (ref 0.0–0.5)
Eosinophils Relative: 1 %
HCT: 37.7 % (ref 36.0–46.0)
Hemoglobin: 12 g/dL (ref 12.0–15.0)
Immature Granulocytes: 0 %
Lymphocytes Relative: 35 %
Lymphs Abs: 3.6 10*3/uL (ref 0.7–4.0)
MCH: 26.4 pg (ref 26.0–34.0)
MCHC: 31.8 g/dL (ref 30.0–36.0)
MCV: 82.9 fL (ref 80.0–100.0)
Monocytes Absolute: 0.5 10*3/uL (ref 0.1–1.0)
Monocytes Relative: 5 %
Neutro Abs: 6 10*3/uL (ref 1.7–7.7)
Neutrophils Relative %: 58 %
Platelets: 276 10*3/uL (ref 150–400)
RBC: 4.55 MIL/uL (ref 3.87–5.11)
RDW: 13.8 % (ref 11.5–15.5)
WBC: 10.3 10*3/uL (ref 4.0–10.5)
nRBC: 0 % (ref 0.0–0.2)

## 2021-11-29 LAB — BASIC METABOLIC PANEL
Anion gap: 11 (ref 5–15)
BUN: 6 mg/dL (ref 6–20)
CO2: 23 mmol/L (ref 22–32)
Calcium: 9.2 mg/dL (ref 8.9–10.3)
Chloride: 106 mmol/L (ref 98–111)
Creatinine, Ser: 0.64 mg/dL (ref 0.44–1.00)
GFR, Estimated: >60 mL/min (ref >60)
Glucose, Bld: 101 mg/dL — ABNORMAL HIGH (ref 70–99)
Potassium: 3.9 mmol/L (ref 3.5–5.1)
Sodium: 140 mmol/L (ref 135–145)

## 2021-11-29 LAB — I-STAT BETA HCG BLOOD, ED (MC, WL, AP ONLY): I-stat hCG, quantitative: <5 m[IU]/mL (ref <5)

## 2021-11-29 LAB — TROPONIN I (HIGH SENSITIVITY): Troponin I (High Sensitivity): 3 ng/L (ref <18)

## 2021-11-29 NOTE — ED Provider Triage Note (Signed)
Emergency Medicine Provider Triage Evaluation Note  [name] , a 31 y.o. female  was evaluated in triage.  Pt complains of chest pain that began tonight, dizziness x2-3 days.  EMS called out earlier tonight for same, refused transport but called back due to worsening symptoms.  Chest pain is pressure, worse with sitting up and moving around.  Given '324mg'$  ASA.  Review of Systems  Positive: Chest pain, dizziness Negative: fever  Physical Exam  BP 104/77 (BP Location: Right Arm)   Pulse 100   Temp 98.5 F (36.9 C) (Oral)   Resp 16   Ht '5\' 4"'$  (1.626 m)   Wt 90.7 kg   SpO2 97%   BMI 34.33 kg/m   Gen:   Awake, no distress   Resp:  Normal effort  MSK:   Moves extremities without difficulty  Other:    Medical Decision Making  Medically screening exam initiated at 1:31 AM.  Appropriate orders placed.  [name] was informed that the remainder of the evaluation will be completed by another provider, this initial triage assessment does not replace that evaluation, and the importance of remaining in the ED until their evaluation is complete.  Chest pain, dizziness.  EKG, labs, CXR.   Larene Pickett, PA-C 11/29/21 [phone]

## 2021-11-29 NOTE — ED Triage Notes (Signed)
PER EMS: pt is from home with c/o chest pain and dizziness ongoing for 2-3 days. CP started tonight described as pressure in the middle of her chest.  EMS adm 324 asa en route. EKG unremarkable.   BP- 138/70, HR-72, RR-16, CBG-106

## 2021-11-29 NOTE — ED Notes (Signed)
PT called for repeat labs X2 no response

## 2021-12-04 DIAGNOSIS — R457 State of emotional shock and stress, unspecified: Secondary | ICD-10-CM | POA: Diagnosis not present

## 2021-12-04 DIAGNOSIS — R000 Tachycardia, unspecified: Secondary | ICD-10-CM | POA: Diagnosis not present

## 2021-12-04 DIAGNOSIS — R0689 Other abnormalities of breathing: Secondary | ICD-10-CM | POA: Diagnosis not present

## 2021-12-04 DIAGNOSIS — I959 Hypotension, unspecified: Secondary | ICD-10-CM | POA: Diagnosis not present

## 2021-12-10 ENCOUNTER — Other Ambulatory Visit: Payer: Self-pay

## 2021-12-10 ENCOUNTER — Ambulatory Visit (HOSPITAL_COMMUNITY)
Admission: EM | Admit: 2021-12-10 | Discharge: 2021-12-11 | Disposition: A | Discharge status: Home / Self Care | Payer: PPO | Attending: Urology | Admitting: Urology

## 2021-12-10 DIAGNOSIS — R45851 Suicidal ideations: Secondary | ICD-10-CM | POA: Diagnosis not present

## 2021-12-10 DIAGNOSIS — F845 Asperger's syndrome: Secondary | ICD-10-CM | POA: Diagnosis not present

## 2021-12-10 DIAGNOSIS — F4310 Post-traumatic stress disorder, unspecified: Secondary | ICD-10-CM | POA: Insufficient documentation

## 2021-12-10 DIAGNOSIS — F250 Schizoaffective disorder, bipolar type: Secondary | ICD-10-CM | POA: Diagnosis not present

## 2021-12-10 DIAGNOSIS — Z1152 Encounter for screening for COVID-19: Secondary | ICD-10-CM | POA: Diagnosis not present

## 2021-12-10 DIAGNOSIS — Z9151 Personal history of suicidal behavior: Secondary | ICD-10-CM | POA: Insufficient documentation

## 2021-12-10 DIAGNOSIS — F603 Borderline personality disorder: Secondary | ICD-10-CM | POA: Insufficient documentation

## 2021-12-10 LAB — COMPREHENSIVE METABOLIC PANEL
ALT: 13 U/L (ref 0–44)
AST: 17 U/L (ref 15–41)
Albumin: 3.6 g/dL (ref 3.5–5.0)
Alkaline Phosphatase: 67 U/L (ref 38–126)
Anion gap: 8 (ref 5–15)
BUN: 5 mg/dL — ABNORMAL LOW (ref 6–20)
CO2: 21 mmol/L — ABNORMAL LOW (ref 22–32)
Calcium: 9.2 mg/dL (ref 8.9–10.3)
Chloride: 108 mmol/L (ref 98–111)
Creatinine, Ser: 0.61 mg/dL (ref 0.44–1.00)
GFR, Estimated: >60 mL/min (ref >60)
Glucose, Bld: 95 mg/dL (ref 70–99)
Potassium: 3.6 mmol/L (ref 3.5–5.1)
Sodium: 137 mmol/L (ref 135–145)
Total Bilirubin: 0.3 mg/dL (ref 0.3–1.2)
Total Protein: 6.9 g/dL (ref 6.5–8.1)

## 2021-12-10 LAB — CBC WITH DIFFERENTIAL/PLATELET
Abs Immature Granulocytes: 0.04 10*3/uL (ref 0.00–0.07)
Basophils Absolute: 0.1 10*3/uL (ref 0.0–0.1)
Basophils Relative: 1 %
Eosinophils Absolute: 0.1 10*3/uL (ref 0.0–0.5)
Eosinophils Relative: 1 %
HCT: 35.6 % — ABNORMAL LOW (ref 36.0–46.0)
Hemoglobin: 11.5 g/dL — ABNORMAL LOW (ref 12.0–15.0)
Immature Granulocytes: 0 %
Lymphocytes Relative: 33 %
Lymphs Abs: 3.6 10*3/uL (ref 0.7–4.0)
MCH: 25.8 pg — ABNORMAL LOW (ref 26.0–34.0)
MCHC: 32.3 g/dL (ref 30.0–36.0)
MCV: 79.8 fL — ABNORMAL LOW (ref 80.0–100.0)
Monocytes Absolute: 0.6 10*3/uL (ref 0.1–1.0)
Monocytes Relative: 6 %
Neutro Abs: 6.4 10*3/uL (ref 1.7–7.7)
Neutrophils Relative %: 59 %
Platelets: 325 10*3/uL (ref 150–400)
RBC: 4.46 MIL/uL (ref 3.87–5.11)
RDW: 13.6 % (ref 11.5–15.5)
WBC: 10.8 10*3/uL — ABNORMAL HIGH (ref 4.0–10.5)
nRBC: 0 % (ref 0.0–0.2)

## 2021-12-10 LAB — LIPID PANEL
Cholesterol: 175 mg/dL (ref 0–200)
HDL: 44 mg/dL (ref >40)
LDL Cholesterol: 102 mg/dL — ABNORMAL HIGH (ref 0–99)
Total CHOL/HDL Ratio: 4 RATIO
Triglycerides: 145 mg/dL (ref <150)
VLDL: 29 mg/dL (ref 0–40)

## 2021-12-10 LAB — RESP PANEL BY RT-PCR (FLU A&B, COVID) ARPGX2
Influenza A by PCR: NEGATIVE
Influenza B by PCR: NEGATIVE
SARS Coronavirus 2 by RT PCR: NEGATIVE

## 2021-12-10 LAB — POCT URINE DRUG SCREEN - MANUAL ENTRY (I-SCREEN)
POC Amphetamine UR: NOT DETECTED
POC Buprenorphine (BUP): NOT DETECTED
POC Cocaine UR: NOT DETECTED
POC Marijuana UR: NOT DETECTED
POC Methadone UR: NOT DETECTED
POC Methamphetamine UR: NOT DETECTED
POC Morphine: NOT DETECTED
POC Oxazepam (BZO): NOT DETECTED
POC Oxycodone UR: NOT DETECTED
POC Secobarbital (BAR): NOT DETECTED

## 2021-12-10 LAB — POC SARS CORONAVIRUS 2 AG: SARSCOV2ONAVIRUS 2 AG: NEGATIVE

## 2021-12-10 LAB — HCG, QUANTITATIVE, PREGNANCY: hCG, Beta Chain, Quant, S: <1 m[IU]/mL (ref <5)

## 2021-12-10 LAB — TSH: TSH: 1.245 u[IU]/mL (ref 0.350–4.500)

## 2021-12-10 MED ORDER — PROPRANOLOL HCL 10 MG PO TABS
10.0000 mg | ORAL_TABLET | Freq: Two times a day (BID) | ORAL | Status: DC
  Administered 2021-12-11: 10 mg via ORAL
  Filled 2021-12-10: qty 1

## 2021-12-10 MED ORDER — PRAZOSIN HCL 1 MG PO CAPS: 1.0000 mg | ORAL_CAPSULE | Freq: Every day | ORAL | Status: DC

## 2021-12-10 MED ORDER — CARIPRAZINE HCL 1.5 MG PO CAPS
1.5000 mg | ORAL_CAPSULE | Freq: Every day | ORAL | Status: DC
  Administered 2021-12-11: 1.5 mg via ORAL
  Filled 2021-12-10: qty 1

## 2021-12-10 MED ORDER — PANTOPRAZOLE SODIUM 40 MG PO TBEC
40.0000 mg | DELAYED_RELEASE_TABLET | Freq: Every day | ORAL | Status: DC
  Administered 2021-12-11: 40 mg via ORAL
  Filled 2021-12-10: qty 1

## 2021-12-10 MED ORDER — ALUM & MAG HYDROXIDE-SIMETH 200-200-20 MG/5ML PO SUSP: 30.0000 mL | ORAL | Status: DC | PRN

## 2021-12-10 MED ORDER — HYDROXYZINE HCL 25 MG PO TABS
25.0000 mg | ORAL_TABLET | Freq: Three times a day (TID) | ORAL | Status: DC | PRN
  Administered 2021-12-10: 25 mg via ORAL
  Filled 2021-12-10: qty 1

## 2021-12-10 MED ORDER — MOMETASONE FURO-FORMOTEROL FUM 200-5 MCG/ACT IN AERO
2.0000 | INHALATION_SPRAY | Freq: Two times a day (BID) | RESPIRATORY_TRACT | Status: DC
  Filled 2021-12-10: qty 8.8

## 2021-12-10 MED ORDER — ALBUTEROL SULFATE HFA 108 (90 BASE) MCG/ACT IN AERS: 2.0000 | INHALATION_SPRAY | Freq: Four times a day (QID) | RESPIRATORY_TRACT | Status: DC | PRN

## 2021-12-10 MED ORDER — QUETIAPINE FUMARATE 25 MG PO TABS
12.5000 mg | ORAL_TABLET | Freq: Two times a day (BID) | ORAL | Status: DC
  Administered 2021-12-10 – 2021-12-11 (×2): 12.5 mg via ORAL
  Filled 2021-12-10 (×2): qty 1

## 2021-12-10 MED ORDER — DICLOFENAC SODIUM 1 % EX GEL
2.0000 g | Freq: Four times a day (QID) | CUTANEOUS | Status: DC
  Filled 2021-12-10: qty 100

## 2021-12-10 MED ORDER — PREGABALIN 50 MG PO CAPS
50.0000 mg | ORAL_CAPSULE | Freq: Three times a day (TID) | ORAL | Status: DC
  Administered 2021-12-11: 50 mg via ORAL
  Filled 2021-12-10: qty 1

## 2021-12-10 MED ORDER — MAGNESIUM HYDROXIDE 400 MG/5ML PO SUSP: 30.0000 mL | Freq: Every day | ORAL | Status: DC | PRN

## 2021-12-10 NOTE — ED Notes (Addendum)
Pt A&O x 4, [facility] transport, presents with complaint of depression and suicidal ideations, plan to run through glass or stab self with knife.  Denies HI, hearing  voices.  Pt calm & cooperative, no distress noted.  Monitoring for safety.

## 2021-12-10 NOTE — ED Provider Notes (Incomplete)
[facility]gent [facility] Continuous Assessment Admission H&P  Date: 12/11/21 Patient Name: [name] MRN: [id number] Chief Complaint:  Chief Complaint  Patient presents with   Suicidal      Diagnoses:  Final diagnoses:  Schizoaffective disorder, bipolar type ([place])  Borderline personality disorder in adult [facility])    HPI: [name] is a 31 year old female with psychiatric history significant for multiple suicidal attempts, schizoaffective disorder bipolar type, Asperger's, PTSD, and borderline personality disorder.  Patient presented voluntarily via law enforcement reporting increased stress, suicidal ideation, and worsening depressive symptoms.  [name] was seen face to face and her chart was reviewed by this nurse practitioner.  On evaluation, she is alert and oriented x 4. Her speech is clear and coherent with moderate volume and face pace. Patient's mood is depressed with congruent affect.  Objectively, there is no signs that she is responding to any internal/external stimuli or experiencing any delusional thought content at this time.  Patient reports that she is stressed and feeling overwhelmed.  She reports that she has been experiencing increased depressive symptoms and having suicidal ideation with plan to "run through a glass, stab myself with a knife, or overdose on my pills when I pick up my medications on Tuesday." Patient states "I don't want to kill myself but I do. If I'm not here I likely gonna do it."  She is endorsing depressive symptoms of social isolation, fatigue, irritability, decreased sleep, poor appetite, worthlessness, crying spells, and restlessness. She denies homicidal ideation; she admits to auditory hallucination of "murmurs, voices I cannot understand."  She denies paranoia, visual hallucination, and recent substance use. She reports that she typically 2 wine coolers/day but has not drank alcohol in over 2 weeks because she thinks she might be pregnant.  She is requesting a beta hcg lab test. She reports last menstrual period was in Oct 2023. She is unsure of exact date.   Beta HCG is <25mU/mL  PHQ 2-9:  [facility] Thoughts that you would be better off dead, or of hurting yourself in some way Several days Nearly every day Nearly every day  [Phreesia 02/03/2020]  PHQ-9 Total Score '9 17 27       '$ [facility] 11/01/2021 in [facility] Moderate Risk Error: Question 6 not populated Error: Q3, 4, or 5 should not be populated when Q2 is No        Total Time spent with patient: 30 minutes  Musculoskeletal  Strength & Muscle Tone: within normal limits Gait & Station: normal Patient leans: Right  Psychiatric Specialty Exam  Presentation General Appearance:  Appropriate for Environment  Eye Contact: Good  Speech: Clear and Coherent; Pressured  Speech Volume: Normal  Handedness: Right   Mood and Affect  Mood: Depressed; Anxious  Affect: Congruent   Thought Process  Thought Processes: Coherent  Descriptions of Associations:Intact  Orientation:Full (Time, Place and Person)  Thought Content:WDL  Diagnosis of Schizophrenia or Schizoaffective disorder in past: Yes  Duration of Psychotic Symptoms: Greater than six months  Hallucinations:Hallucinations: Auditory Description of Auditory Hallucinations: "murmurs"  Ideas of Reference:None  Suicidal Thoughts:Suicidal Thoughts: Yes, Active SI Active Intent and/or Plan: With Plan  Homicidal Thoughts:Homicidal Thoughts: No   Sensorium  Memory: Immediate Good; Recent Good;  Remote Good  Judgment: Fair  Insight: Good   Executive Functions  Concentration: Fair  Attention Span: Fair  Recall: Good  Fund of Knowledge: Good  Language: Good   Psychomotor Activity  Psychomotor Activity: Psychomotor Activity: Restlessness   Assets  Assets: Communication Skills; Desire for Improvement; Housing; Physical Health   Sleep  Sleep: Sleep: Poor Number of Hours of Sleep: 2   Nutritional Assessment (For OBS and FBC admissions only) Has the patient had a weight loss or gain of 10 pounds or more in the last 3 months?: No Has the patient had a decrease in food intake/or appetite?: Yes Does the patient have eating habits or behaviors that may be indicators of an eating disorder including binging or inducing vomiting?: No Has the patient recently lost weight without trying?: 0 Has the patient been eating poorly because of a decreased appetite?: 0 Malnutrition Screening Tool Score: 0    Physical Exam Vitals and nursing note reviewed.  Constitutional:      General: She is not in acute distress.    Appearance: She is well-developed.  HENT:     Head: Normocephalic and atraumatic.  Eyes:     Conjunctiva/sclera: Conjunctivae normal.  Cardiovascular:     Rate and Rhythm: Normal rate.     Heart sounds: No murmur heard. Pulmonary:     Effort: Pulmonary effort is normal. No respiratory distress.  Abdominal:     Palpations: Abdomen is soft.     Tenderness: There is no abdominal tenderness.  Musculoskeletal:        General: No swelling.     Cervical back: Normal range of motion.  Skin:    General: Skin is warm and dry.     Capillary Refill: Capillary refill takes less than 2 seconds.  Neurological:     Mental Status: She is alert.  Psychiatric:        Attention and Perception: Attention normal. She perceives auditory hallucinations. She does not perceive visual hallucinations.        Mood and Affect: Mood is anxious and depressed.         Speech: Speech normal.        Behavior: Behavior normal. Behavior is cooperative.        Thought Content: Thought content is not paranoid or delusional. Thought content includes suicidal ideation. Thought content does not include homicidal ideation. Thought content includes suicidal plan. Thought content does not include homicidal plan.        Cognition and Memory: Cognition normal.    Review of Systems  Constitutional: Negative.   HENT: Negative.    Eyes: Negative.   Respiratory: Negative.    Cardiovascular: Negative.   Gastrointestinal: Negative.   Genitourinary: Negative.   Musculoskeletal: Negative.   Skin: Negative.   Neurological: Negative.   Endo/Heme/Allergies: Negative.   Psychiatric/Behavioral:  Positive for depression, hallucinations and suicidal ideas. The patient is nervous/anxious.     Blood pressure 120/70, pulse (!) 105, temperature 98.9 F (37.2 C), temperature source Oral, resp. rate 18, last menstrual period 11/15/2021, SpO2 100 %. There is no height or weight on file to calculate BMI.  Past Psychiatric History:    Is the patient at risk to self? Yes  Has the patient been a risk to self in the past 6 months? Yes .    Has the patient been a risk to self within the distant past? Yes   Is the  patient a risk to others? No   Has the patient been a risk to others in the past 6 months? No   Has the patient been a risk to others within the distant past? Yes   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 fragile X syndrome    Chronic constipation    Depression    Drug-seeking behavior    Essential tremor    Headache    Overdose of acetaminophen 07/2017   and other meds   Personality disorder ([place])    Schizo-affective psychosis ([place])    Schizoaffective disorder, bipolar type ([place])    Seizures ([place])    Last seizure December 2017   Sleep apnea      Past Surgical History:  Procedure Laterality Date   MOUTH SURGERY  2009 or 2010    Family History:  Family History  Problem Relation Age of Onset   Mental illness Father    Asthma Father    PDD Brother    Seizures Brother     Social History:  Social History   Socioeconomic History   Marital status: Widowed    Spouse name: Not on file   Number of children: 0   Years of education: Not on file   Highest education level: Not on file  Occupational History   Occupation: disability  Tobacco Use   Smoking status: Former    Packs/day: 0.00    Types: Cigarettes   Smokeless tobacco: Never   Tobacco comments:    Smoked for 2  years age [age]  Vaping Use   Vaping Use: Never used  Substance and Sexual Activity   Alcohol use: No    Alcohol/week: 1.0 standard drink of alcohol    Types: 1 Standard drinks or equivalent per week    Comment: denies at this time   Drug use: No    Comment: History of cocaine use at age 36 for 4 months   Sexual activity: Not Currently    Birth control/protection: None  Other Topics Concern   Not on file  Social History Narrative   Marital status: Widowed      Children: daughter      Lives: with boyfriend, in two story home      Employment:  Disability      Tobacco: quit smoking; smoked for two years.      Alcohol ;none      Drugs: none   Has not traveled outside of the country.   Right handed         Social Determinants of Health   Financial Resource Strain: Not on file  Food Insecurity: No Food Insecurity (10/18/2021)   Hunger Vital Sign    Worried About Running Out of Food in the Last Year: Never true    Ran Out of Food in the Last Year: Never true  Transportation Needs: No Transportation Needs (10/18/2021)   PRAPARE - Hydrologist (Medical): No    Lack of Transportation (Non-Medical): No  Physical Activity: Not on file  Stress: Not on file  Social Connections: Not on file  Intimate Partner Violence: Not  At Risk (10/18/2021)   Humiliation, Afraid, Rape, and Kick questionnaire    Fear of Current or Ex-Partner: No    Emotionally Abused: No    Physically Abused:  No    Sexually Abused: No    SDOH:  SDOH Screenings   Food Insecurity: No Food Insecurity (10/18/2021)  Housing: Medium Risk (10/18/2021)  Transportation Needs: No Transportation Needs (10/18/2021)  Utilities: Not At Risk (10/18/2021)  Alcohol Screen: Low Risk  (10/18/2021)  Depression (PHQ2-9): Medium Risk (06/27/2021)  Tobacco Use: Medium Risk (11/29/2021)    Last Labs:  Admission on 12/10/2021  Component Date Value Ref Range Status   SARS Coronavirus 2 by RT PCR 12/10/2021 NEGATIVE  NEGATIVE Final   Comment: (NOTE) SARS-CoV-2 target nucleic acids are NOT DETECTED.  The SARS-CoV-2 RNA is generally detectable in upper respiratory specimens during the acute phase of infection. The lowest concentration of SARS-CoV-2 viral copies this assay can detect is 138 copies/mL. A negative result does not preclude SARS-Cov-2 infection and should not be used as the sole basis for treatment or other patient management decisions. A negative result may occur with  improper specimen collection/handling, submission of specimen other than nasopharyngeal swab, presence of viral mutation(s) within the areas targeted by this assay, and inadequate number of viral copies(<138 copies/mL). A negative result must be combined with clinical observations, patient history, and epidemiological information. The expected result is Negative.  Fact Sheet for Patients:  EntrepreneurPulse.com.au  Fact Sheet for Healthcare Providers:  IncredibleEmployment.be  This test is no                          t yet approved or cleared by the [place] FDA and  has been authorized for detection and/or diagnosis of SARS-CoV-2 by FDA under an Emergency Use Authorization (EUA). This EUA will remain  in effect (meaning this test can  be used) for the duration of the COVID-19 declaration under Section 564(b)(1) of the Act, 21 U.S.C.section 360bbb-3(b)(1), unless the authorization is terminated  or revoked sooner.       Influenza A by PCR 12/10/2021 NEGATIVE  NEGATIVE Final   Influenza B by PCR 12/10/2021 NEGATIVE  NEGATIVE Final   Comment: (NOTE) The Xpert Xpress SARS-CoV-2/FLU/RSV plus assay is intended as an aid in the diagnosis of influenza from Nasopharyngeal swab specimens and should not be used as a sole basis for treatment. Nasal washings and aspirates are unacceptable for Xpert Xpress SARS-CoV-2/FLU/RSV testing.  Fact Sheet for Patients: EntrepreneurPulse.com.au  Fact Sheet for Healthcare Providers: IncredibleEmployment.be  This test is not yet approved or cleared by the [place] FDA and has been authorized for detection and/or diagnosis of SARS-CoV-2 by FDA under an Emergency Use Authorization (EUA). This EUA will remain in effect (meaning this test can be used) for the duration of the COVID-19 declaration under Section 564(b)(1) of the Act, 21 U.S.C. section 360bbb-3(b)(1), unless the authorization is terminated or revoked.  Performed at [facility], [place] [address], [place], [place] [address]    WBC 12/10/2021 10.8 (H)  4.0 - 10.5 K/uL Final   RBC 12/10/2021 4.46  3.87 - 5.11 MIL/uL Final   Hemoglobin 12/10/2021 11.5 (L)  12.0 - 15.0 g/dL Final   HCT 12/10/2021 35.6 (L)  36.0 - 46.0 % Final   MCV 12/10/2021 79.8 (L)  80.0 - 100.0 fL Final   MCH 12/10/2021 25.8 (L)  26.0 - 34.0 pg Final   MCHC 12/10/2021 32.3  30.0 - 36.0 g/dL Final   RDW 12/10/2021 13.6  11.5 - 15.5 % Final   Platelets 12/10/2021 325  150 - 400 K/uL Final   nRBC 12/10/2021 0.0  0.0 -  0.2 % Final   Neutrophils Relative % 12/10/2021 59  % Final   Neutro Abs 12/10/2021 6.4  1.7 - 7.7 K/uL Final   Lymphocytes Relative 12/10/2021 33  % Final   Lymphs Abs 12/10/2021 3.6  0.7 - 4.0  K/uL Final   Monocytes Relative 12/10/2021 6  % Final   Monocytes Absolute 12/10/2021 0.6  0.1 - 1.0 K/uL Final   Eosinophils Relative 12/10/2021 1  % Final   Eosinophils Absolute 12/10/2021 0.1  0.0 - 0.5 K/uL Final   Basophils Relative 12/10/2021 1  % Final   Basophils Absolute 12/10/2021 0.1  0.0 - 0.1 K/uL Final   Immature Granulocytes 12/10/2021 0  % Final   Abs Immature Granulocytes 12/10/2021 0.04  0.00 - 0.07 K/uL Final   Performed at [facility], [place] [address], [place], [place] [address]   Sodium 12/10/2021 137  135 - 145 mmol/L Final   Potassium 12/10/2021 3.6  3.5 - 5.1 mmol/L Final   Chloride 12/10/2021 108  98 - 111 mmol/L Final   CO2 12/10/2021 21 (L)  22 - 32 mmol/L Final   Glucose, Bld 12/10/2021 95  70 - 99 mg/dL Final   Glucose reference range applies only to samples taken after fasting for at least 8 hours.   BUN 12/10/2021 5 (L)  6 - 20 mg/dL Final   Creatinine, Ser 12/10/2021 0.61  0.44 - 1.00 mg/dL Final   Calcium 12/10/2021 9.2  8.9 - 10.3 mg/dL Final   Total Protein 12/10/2021 6.9  6.5 - 8.1 g/dL Final   Albumin 12/10/2021 3.6  3.5 - 5.0 g/dL Final   AST 12/10/2021 17  15 - 41 U/L Final   ALT 12/10/2021 13  0 - 44 U/L Final   Alkaline Phosphatase 12/10/2021 67  38 - 126 U/L Final   Total Bilirubin 12/10/2021 0.3  0.3 - 1.2 mg/dL Final   GFR, Estimated 12/10/2021 >60  >60 mL/min Final   Comment: (NOTE) Calculated using the CKD-EPI Creatinine Equation (2021)    Anion gap 12/10/2021 8  5 - 15 Final   Performed at [place] [address], [place], [place] [address]   Cholesterol 12/10/2021 175  0 - 200 mg/dL Final   Triglycerides 12/10/2021 145  <150 mg/dL Final   HDL 12/10/2021 44  >40 mg/dL Final   Total CHOL/HDL Ratio 12/10/2021 4.0  RATIO Final   VLDL 12/10/2021 29  0 - 40 mg/dL Final   LDL Cholesterol 12/10/2021 102 (H)  0 - 99 mg/dL Final   Comment:        Total Cholesterol/HDL:CHD Risk Coronary Heart Disease Risk Table                      Men   Women  1/2 Average Risk   3.4   3.3  Average Risk       5.0   4.4  2 X Average Risk   9.6   7.1  3 X Average Risk  23.4   11.0        Use the calculated Patient Ratio above and the CHD Risk Table to determine the patient's CHD Risk.        ATP III CLASSIFICATION (LDL):  <100     mg/dL   Optimal  100-129  mg/dL   Near or Above                    Optimal  130-159  mg/dL   Borderline  160-189  mg/dL   High  >190     mg/dL   Very High Performed at [facility] [address] [place], [place] [address]    POC Amphetamine UR 12/10/2021 None Detected  NONE DETECTED (Cut Off Level 1000 ng/mL) Final   POC Secobarbital (BAR) 12/10/2021 None Detected  NONE DETECTED (Cut Off Level 300 ng/mL) Final   POC Buprenorphine (BUP) 12/10/2021 None Detected  NONE DETECTED (Cut Off Level 10 ng/mL) Final   POC Oxazepam (BZO) 12/10/2021 None Detected  NONE DETECTED (Cut Off Level 300 ng/mL) Final   POC Cocaine UR 12/10/2021 None Detected  NONE DETECTED (Cut Off Level 300 ng/mL) Final   POC Methamphetamine UR 12/10/2021 None Detected  NONE DETECTED (Cut Off Level 1000 ng/mL) Final   POC Morphine 12/10/2021 None Detected  NONE DETECTED (Cut Off Level 300 ng/mL) Final   POC Methadone UR 12/10/2021 None Detected  NONE DETECTED (Cut Off Level 300 ng/mL) Final   POC Oxycodone UR 12/10/2021 None Detected  NONE DETECTED (Cut Off Level 100 ng/mL) Final   POC Marijuana UR 12/10/2021 None Detected  NONE DETECTED (Cut Off Level 50 ng/mL) Final   SARSCOV2ONAVIRUS 2 AG 12/10/2021 NEGATIVE  NEGATIVE Final   Comment: (NOTE) SARS-CoV-2 antigen NOT DETECTED.   Negative results are presumptive.  Negative results do not preclude SARS-CoV-2 infection and should not be used as the sole basis for treatment or other patient management decisions, including infection  control decisions, particularly in the presence of clinical signs and  symptoms consistent with COVID-19, or in those who have been in contact  with the virus.  Negative results must be combined with clinical observations, patient history, and epidemiological information. The expected result is Negative.  Fact Sheet for Patients: HandmadeRecipes.com.cy  Fact Sheet for Healthcare Providers: FuneralLife.at  This test is not yet approved or cleared by the [place] FDA and  has been authorized for detection and/or diagnosis of SARS-CoV-2 by FDA under an Emergency Use Authorization (EUA).  This EUA will remain in effect (meaning this test can be used) for the duration of  the COV                          ID-19 declaration under Section 564(b)(1) of the Act, 21 U.S.C. section 360bbb-3(b)(1), unless the authorization is terminated or revoked sooner.     hCG, Beta Chain, Quant, S 12/10/2021 <1  <5 mIU/mL Final   Comment:          GEST. AGE      CONC.  (mIU/mL)   <=1 WEEK        5 - 50     2 WEEKS       50 - 500     3 WEEKS       100 - 10,000     4 WEEKS     1,000 - 30,000     5 WEEKS     3,500 - 115,000   6-8 WEEKS     12,000 - 270,000    12 WEEKS     15,000 - 220,000        FEMALE AND NON-PREGNANT FEMALE:     LESS THAN 5 mIU/mL Performed at [facility], [place] [address], [place], [place] [address]    TSH 12/10/2021 1.245  0.350 - 4.500 uIU/mL Final   Comment: Performed by a 3rd Generation assay with a functional sensitivity  of <=0.01 uIU/mL. Performed at [facility], [place] [address], [place], [place] [address]   Admission on 11/29/2021, Discharged on 11/29/2021  Component Date Value Ref Range Status   WBC 11/29/2021 10.3  4.0 - 10.5 K/uL Final   RBC 11/29/2021 4.55  3.87 - 5.11 MIL/uL Final   Hemoglobin 11/29/2021 12.0  12.0 - 15.0 g/dL Final   HCT 11/29/2021 37.7  36.0 - 46.0 % Final   MCV 11/29/2021 82.9  80.0 - 100.0 fL Final   MCH 11/29/2021 26.4  26.0 - 34.0 pg Final   MCHC 11/29/2021 31.8  30.0 - 36.0 g/dL Final   RDW 11/29/2021 13.8  11.5 - 15.5 %  Final   Platelets 11/29/2021 276  150 - 400 K/uL Final   nRBC 11/29/2021 0.0  0.0 - 0.2 % Final   Neutrophils Relative % 11/29/2021 58  % Final   Neutro Abs 11/29/2021 6.0  1.7 - 7.7 K/uL Final   Lymphocytes Relative 11/29/2021 35  % Final   Lymphs Abs 11/29/2021 3.6  0.7 - 4.0 K/uL Final   Monocytes Relative 11/29/2021 5  % Final   Monocytes Absolute 11/29/2021 0.5  0.1 - 1.0 K/uL Final   Eosinophils Relative 11/29/2021 1  % Final   Eosinophils Absolute 11/29/2021 0.1  0.0 - 0.5 K/uL Final   Basophils Relative 11/29/2021 1  % Final   Basophils Absolute 11/29/2021 0.1  0.0 - 0.1 K/uL Final   Immature Granulocytes 11/29/2021 0  % Final   Abs Immature Granulocytes 11/29/2021 0.03  0.00 - 0.07 K/uL Final   Performed at [facility], [place] [address] [place], [place] [address]   Sodium 11/29/2021 140  135 - 145 mmol/L Final   Potassium 11/29/2021 3.9  3.5 - 5.1 mmol/L Final   Chloride 11/29/2021 106  98 - 111 mmol/L Final   CO2 11/29/2021 23  22 - 32 mmol/L Final   Glucose, Bld 11/29/2021 101 (H)  70 - 99 mg/dL Final   Glucose reference range applies only to samples taken after fasting for at least 8 hours.   BUN 11/29/2021 6  6 - 20 mg/dL Final   Creatinine, Ser 11/29/2021 0.64  0.44 - 1.00 mg/dL Final   Calcium 11/29/2021 9.2  8.9 - 10.3 mg/dL Final   GFR, Estimated 11/29/2021 >60  >60 mL/min Final   Comment: (NOTE) Calculated using the CKD-EPI Creatinine Equation (2021)    Anion gap 11/29/2021 11  5 - 15 Final   Performed at [facility], [place] [address] [place], [place] [address]   Troponin I (High Sensitivity) 11/29/2021 3  <18 ng/L Final   Comment: (NOTE) Elevated high sensitivity troponin I (hsTnI) values and significant  changes across serial measurements may suggest ACS but many other  chronic and acute conditions are known to elevate hsTnI results.  Refer to the "Links" section for chest pain algorithms and additional  guidance. Performed at [place], [place] [address], [place], [place] [address]    I-stat hCG, quantitative 11/29/2021 <5.0  <5 mIU/mL Final   Comment 3 11/29/2021          Final   Comment:   GEST. AGE      CONC.  (mIU/mL)   <=1 WEEK        5 - 50     2 WEEKS       50 - 500     3 WEEKS       100 -  10,000     4 WEEKS     1,000 - 30,000        FEMALE AND NON-PREGNANT FEMALE:     LESS THAN 5 mIU/mL   Admission on 11/01/2021, Discharged on 11/03/2021  Component Date Value Ref Range Status   SARS Coronavirus 2 by RT PCR 11/01/2021 NEGATIVE  NEGATIVE Final   Comment: (NOTE) SARS-CoV-2 target nucleic acids are NOT DETECTED.  The SARS-CoV-2 RNA is generally detectable in upper respiratory specimens during the acute phase of infection. The lowest concentration of SARS-CoV-2 viral copies this assay can detect is 138 copies/mL. A negative result does not preclude SARS-Cov-2 infection and should not be used as the sole basis for treatment or other patient management decisions. A negative result may occur with  improper specimen collection/handling, submission of specimen other than nasopharyngeal swab, presence of viral mutation(s) within the areas targeted by this assay, and inadequate number of viral copies(<138 copies/mL). A negative result must be combined with clinical observations, patient history, and epidemiological information. The expected result is Negative.  Fact Sheet for Patients:  EntrepreneurPulse.com.au  Fact Sheet for Healthcare Providers:  IncredibleEmployment.be  This test is no                          t yet approved or cleared by the [place] FDA and  has been authorized for detection and/or diagnosis of SARS-CoV-2 by FDA under an Emergency Use Authorization (EUA). This EUA will remain  in effect (meaning this test can be used) for the duration of the COVID-19 declaration under Section 564(b)(1) of the Act, 21 U.S.C.section 360bbb-3(b)(1), unless the  authorization is terminated  or revoked sooner.       Influenza A by PCR 11/01/2021 NEGATIVE  NEGATIVE Final   Influenza B by PCR 11/01/2021 NEGATIVE  NEGATIVE Final   Comment: (NOTE) The Xpert Xpress SARS-CoV-2/FLU/RSV plus assay is intended as an aid in the diagnosis of influenza from Nasopharyngeal swab specimens and should not be used as a sole basis for treatment. Nasal washings and aspirates are unacceptable for Xpert Xpress SARS-CoV-2/FLU/RSV testing.  Fact Sheet for Patients: EntrepreneurPulse.com.au  Fact Sheet for Healthcare Providers: IncredibleEmployment.be  This test is not yet approved or cleared by the [place] FDA and has been authorized for detection and/or diagnosis of SARS-CoV-2 by FDA under an Emergency Use Authorization (EUA). This EUA will remain in effect (meaning this test can be used) for the duration of the COVID-19 declaration under Section 564(b)(1) of the Act, 21 U.S.C. section 360bbb-3(b)(1), unless the authorization is terminated or revoked.  Performed at [facility], [place] [address], [place], [place] [address]    Preg Test, Ur 11/01/2021 NEGATIVE  NEGATIVE Final   Comment:        THE SENSITIVITY OF THIS METHODOLOGY IS >24 mIU/mL   Admission on 10/28/2021, Discharged on 10/30/2021  Component Date Value Ref Range Status   Sodium 10/29/2021 138  135 - 145 mmol/L Final   Potassium 10/29/2021 3.8  3.5 - 5.1 mmol/L Final   Chloride 10/29/2021 107  98 - 111 mmol/L Final   CO2 10/29/2021 23  22 - 32 mmol/L Final   Glucose, Bld 10/29/2021 112 (H)  70 - 99 mg/dL Final   Glucose reference range applies only to samples taken after fasting for at least 8 hours.   BUN 10/29/2021 6  6 - 20 mg/dL Final   Creatinine, Ser 10/29/2021 0.70  0.44 - 1.00  mg/dL Final   Calcium 10/29/2021 9.1  8.9 - 10.3 mg/dL Final   Total Protein 10/29/2021 6.4 (L)  6.5 - 8.1 g/dL Final   Albumin 10/29/2021 3.6  3.5 - 5.0 g/dL  Final   AST 10/29/2021 17  15 - 41 U/L Final   ALT 10/29/2021 14  0 - 44 U/L Final   Alkaline Phosphatase 10/29/2021 76  38 - 126 U/L Final   Total Bilirubin 10/29/2021 0.3  0.3 - 1.2 mg/dL Final   GFR, Estimated 10/29/2021 >60  >60 mL/min Final   Comment: (NOTE) Calculated using the CKD-EPI Creatinine Equation (2021)    Anion gap 10/29/2021 8  5 - 15 Final   Performed at [facility], [place] [address] [place], [place] [address]   Alcohol, Ethyl (B) 10/29/2021 <10  <10 mg/dL Final   Comment: (NOTE) Lowest detectable limit for serum alcohol is 10 mg/dL.  For medical purposes only. Performed at [facility], [place] [address], [place], [place] [address]    Salicylate Lvl 18/84/1660 <7.0 (L)  7.0 - 30.0 mg/dL Final   Performed at [facility] [address] [place], [place] [address]   Acetaminophen (Tylenol), Serum 10/29/2021 <10 (L)  10 - 30 ug/mL Final   Comment: (NOTE) Therapeutic concentrations vary significantly. A range of 10-30 ug/mL  may be an effective concentration for many patients. However, some  are best treated at concentrations outside of this range. Acetaminophen concentrations >150 ug/mL at 4 hours after ingestion  and >50 ug/mL at 12 hours after ingestion are often associated with  toxic reactions.  Performed at [facility], [place] [address] [place], [place] [address]    WBC 10/29/2021 9.5  4.0 - 10.5 K/uL Final   RBC 10/29/2021 4.49  3.87 - 5.11 MIL/uL Final   Hemoglobin 10/29/2021 11.8 (L)  12.0 - 15.0 g/dL Final   HCT 10/29/2021 37.0  36.0 - 46.0 % Final   MCV 10/29/2021 82.4  80.0 - 100.0 fL Final   MCH 10/29/2021 26.3  26.0 - 34.0 pg Final   MCHC 10/29/2021 31.9  30.0 - 36.0 g/dL Final   RDW 10/29/2021 13.7  11.5 - 15.5 % Final   Platelets 10/29/2021 292  150 - 400 K/uL Final   nRBC 10/29/2021 0.0  0.0 - 0.2 % Final   Performed at [facility] [address], [place], [place] [address]   Opiates 10/29/2021 NONE DETECTED   NONE DETECTED Final   Cocaine 10/29/2021 NONE DETECTED  NONE DETECTED Final   Benzodiazepines 10/29/2021 NONE DETECTED  NONE DETECTED Final   Amphetamines 10/29/2021 NONE DETECTED  NONE DETECTED Final   Tetrahydrocannabinol 10/29/2021 NONE DETECTED  NONE DETECTED Final   Barbiturates 10/29/2021 NONE DETECTED  NONE DETECTED Final   Comment: (NOTE) DRUG SCREEN FOR MEDICAL PURPOSES ONLY.  IF CONFIRMATION IS NEEDED FOR ANY PURPOSE, NOTIFY LAB WITHIN 5 DAYS.  LOWEST DETECTABLE LIMITS FOR URINE DRUG SCREEN Drug Class                     Cutoff (ng/mL) Amphetamine and metabolites    1000 Barbiturate and metabolites    200 Benzodiazepine                 322 Tricyclics and metabolites     300 Opiates and metabolites        300 Cocaine and metabolites        300 THC  50 Performed at [facility], [place] [address] [place], [place] [address]    I-stat hCG, quantitative 10/29/2021 <5.0  <5 mIU/mL Final   Comment 3 10/29/2021          Final   Comment:   GEST. AGE      CONC.  (mIU/mL)   <=1 WEEK        5 - 50     2 WEEKS       50 - 500     3 WEEKS       100 - 10,000     4 WEEKS     1,000 - 30,000        FEMALE AND NON-PREGNANT FEMALE:     LESS THAN 5 mIU/mL    Lactic Acid, Venous 10/29/2021 1.8  0.5 - 1.9 mmol/L Final   Performed at [facility], [place] [address] [place], [place] [address]   SARS Coronavirus 2 by RT PCR 10/29/2021 NEGATIVE  NEGATIVE Final   Comment: (NOTE) SARS-CoV-2 target nucleic acids are NOT DETECTED.  The SARS-CoV-2 RNA is generally detectable in upper respiratory specimens during the acute phase of infection. The lowest concentration of SARS-CoV-2 viral copies this assay can detect is 138 copies/mL. A negative result does not preclude SARS-Cov-2 infection and should not be used as the sole basis for treatment or other patient management decisions. A negative result may occur with  improper specimen collection/handling,  submission of specimen other than nasopharyngeal swab, presence of viral mutation(s) within the areas targeted by this assay, and inadequate number of viral copies(<138 copies/mL). A negative result must be combined with clinical observations, patient history, and epidemiological information. The expected result is Negative.  Fact Sheet for Patients:  EntrepreneurPulse.com.au  Fact Sheet for Healthcare Providers:  IncredibleEmployment.be  This test is no                          t yet approved or cleared by the [place] FDA and  has been authorized for detection and/or diagnosis of SARS-CoV-2 by FDA under an Emergency Use Authorization (EUA). This EUA will remain  in effect (meaning this test can be used) for the duration of the COVID-19 declaration under Section 564(b)(1) of the Act, 21 U.S.C.section 360bbb-3(b)(1), unless the authorization is terminated  or revoked sooner.       Influenza A by PCR 10/29/2021 NEGATIVE  NEGATIVE Final   Influenza B by PCR 10/29/2021 NEGATIVE  NEGATIVE Final   Comment: (NOTE) The Xpert Xpress SARS-CoV-2/FLU/RSV plus assay is intended as an aid in the diagnosis of influenza from Nasopharyngeal swab specimens and should not be used as a sole basis for treatment. Nasal washings and aspirates are unacceptable for Xpert Xpress SARS-CoV-2/FLU/RSV testing.  Fact Sheet for Patients: EntrepreneurPulse.com.au  Fact Sheet for Healthcare Providers: IncredibleEmployment.be  This test is not yet approved or cleared by the [place] FDA and has been authorized for detection and/or diagnosis of SARS-CoV-2 by FDA under an Emergency Use Authorization (EUA). This EUA will remain in effect (meaning this test can be used) for the duration of the COVID-19 declaration under Section 564(b)(1) of the Act, 21 U.S.C. section 360bbb-3(b)(1), unless the authorization is terminated  or revoked.  Performed at [facility], [place] [address] [place], [place] [address]   Admission on 10/25/2021, Discharged on 10/26/2021  Component Date Value Ref Range Status   SARS Coronavirus 2 by RT PCR 10/25/2021 NEGATIVE  NEGATIVE Final   Comment: (NOTE) SARS-CoV-2 target nucleic acids are NOT DETECTED.  The SARS-CoV-2 RNA is generally detectable in upper respiratory specimens during the acute phase of infection. The lowest concentration of SARS-CoV-2 viral copies this assay can detect is 138 copies/mL. A negative result does not preclude SARS-Cov-2 infection and should not be used as the sole basis for treatment or other patient management decisions. A negative result may occur with  improper specimen collection/handling, submission of specimen other than nasopharyngeal swab, presence of viral mutation(s) within the areas targeted by this assay, and inadequate number of viral copies(<138 copies/mL). A negative result must be combined with clinical observations, patient history, and epidemiological information. The expected result is Negative.  Fact Sheet for Patients:  EntrepreneurPulse.com.au  Fact Sheet for Healthcare Providers:  IncredibleEmployment.be  This test is no                          t yet approved or cleared by the [place] FDA and  has been authorized for detection and/or diagnosis of SARS-CoV-2 by FDA under an Emergency Use Authorization (EUA). This EUA will remain  in effect (meaning this test can be used) for the duration of the COVID-19 declaration under Section 564(b)(1) of the Act, 21 U.S.C.section 360bbb-3(b)(1), unless the authorization is terminated  or revoked sooner.       Influenza A by PCR 10/25/2021 NEGATIVE  NEGATIVE Final   Influenza B by PCR 10/25/2021 NEGATIVE  NEGATIVE Final   Comment: (NOTE) The Xpert Xpress SARS-CoV-2/FLU/RSV plus assay is intended as an aid in the diagnosis of  influenza from Nasopharyngeal swab specimens and should not be used as a sole basis for treatment. Nasal washings and aspirates are unacceptable for Xpert Xpress SARS-CoV-2/FLU/RSV testing.  Fact Sheet for Patients: EntrepreneurPulse.com.au  Fact Sheet for Healthcare Providers: IncredibleEmployment.be  This test is not yet approved or cleared by the [place] FDA and has been authorized for detection and/or diagnosis of SARS-CoV-2 by FDA under an Emergency Use Authorization (EUA). This EUA will remain in effect (meaning this test can be used) for the duration of the COVID-19 declaration under Section 564(b)(1) of the Act, 21 U.S.C. section 360bbb-3(b)(1), unless the authorization is terminated or revoked.  Performed at [facility], [place] [address], [place], [place] [address]    POC Amphetamine UR 10/26/2021 None Detected  NONE DETECTED (Cut Off Level 1000 ng/mL) Final   POC Secobarbital (BAR) 10/26/2021 None Detected  NONE DETECTED (Cut Off Level 300 ng/mL) Final   POC Buprenorphine (BUP) 10/26/2021 None Detected  NONE DETECTED (Cut Off Level 10 ng/mL) Final   POC Oxazepam (BZO) 10/26/2021 None Detected  NONE DETECTED (Cut Off Level 300 ng/mL) Final   POC Cocaine UR 10/26/2021 None Detected  NONE DETECTED (Cut Off Level 300 ng/mL) Final   POC Methamphetamine UR 10/26/2021 None Detected  NONE DETECTED (Cut Off Level 1000 ng/mL) Final   POC Morphine 10/26/2021 None Detected  NONE DETECTED (Cut Off Level 300 ng/mL) Final   POC Methadone UR 10/26/2021 None Detected  NONE DETECTED (Cut Off Level 300 ng/mL) Final   POC Oxycodone UR 10/26/2021 None Detected  NONE DETECTED (Cut Off Level 100 ng/mL) Final   POC Marijuana UR 10/26/2021 None Detected  NONE DETECTED (Cut Off Level 50 ng/mL) Final   SARSCOV2ONAVIRUS 2 AG 10/25/2021 NEGATIVE  NEGATIVE Final   Comment: (NOTE) SARS-CoV-2 antigen NOT DETECTED.   Negative results are presumptive.   Negative results  do not preclude SARS-CoV-2 infection and should not be used as the sole basis for treatment or other patient management decisions, including infection  control decisions, particularly in the presence of clinical signs and  symptoms consistent with COVID-19, or in those who have been in contact with the virus.  Negative results must be combined with clinical observations, patient history, and epidemiological information. The expected result is Negative.  Fact Sheet for Patients: HandmadeRecipes.com.cy  Fact Sheet for Healthcare Providers: FuneralLife.at  This test is not yet approved or cleared by the [place] FDA and  has been authorized for detection and/or diagnosis of SARS-CoV-2 by FDA under an Emergency Use Authorization (EUA).  This EUA will remain in effect (meaning this test can be used) for the duration of  the COV                          ID-19 declaration under Section 564(b)(1) of the Act, 21 U.S.C. section 360bbb-3(b)(1), unless the authorization is terminated or revoked sooner.     Preg Test, Ur 10/26/2021 NEGATIVE  NEGATIVE Final   Comment:        THE SENSITIVITY OF THIS METHODOLOGY IS >24 mIU/mL   Admission on 10/18/2021, Discharged on 10/18/2021  Component Date Value Ref Range Status   SARS Coronavirus 2 by RT PCR 10/18/2021 NEGATIVE  NEGATIVE Final   Comment: (NOTE) SARS-CoV-2 target nucleic acids are NOT DETECTED.  The SARS-CoV-2 RNA is generally detectable in upper respiratory specimens during the acute phase of infection. The lowest concentration of SARS-CoV-2 viral copies this assay can detect is 138 copies/mL. A negative result does not preclude SARS-Cov-2 infection and should not be used as the sole basis for treatment or other patient management decisions. A negative result may occur with  improper specimen collection/handling, submission of specimen other than nasopharyngeal swab,  presence of viral mutation(s) within the areas targeted by this assay, and inadequate number of viral copies(<138 copies/mL). A negative result must be combined with clinical observations, patient history, and epidemiological information. The expected result is Negative.  Fact Sheet for Patients:  EntrepreneurPulse.com.au  Fact Sheet for Healthcare Providers:  IncredibleEmployment.be  This test is no                          t yet approved or cleared by the [place] FDA and  has been authorized for detection and/or diagnosis of SARS-CoV-2 by FDA under an Emergency Use Authorization (EUA). This EUA will remain  in effect (meaning this test can be used) for the duration of the COVID-19 declaration under Section 564(b)(1) of the Act, 21 U.S.C.section 360bbb-3(b)(1), unless the authorization is terminated  or revoked sooner.       Influenza A by PCR 10/18/2021 NEGATIVE  NEGATIVE Final   Influenza B by PCR 10/18/2021 NEGATIVE  NEGATIVE Final   Comment: (NOTE) The Xpert Xpress SARS-CoV-2/FLU/RSV plus assay is intended as an aid in the diagnosis of influenza from Nasopharyngeal swab specimens and should not be used as a sole basis for treatment. Nasal washings and aspirates are unacceptable for Xpert Xpress SARS-CoV-2/FLU/RSV testing.  Fact Sheet for Patients: EntrepreneurPulse.com.au  Fact Sheet for Healthcare Providers: IncredibleEmployment.be  This test is not yet approved or cleared by the [place] FDA and has been authorized for detection and/or diagnosis of SARS-CoV-2 by FDA under an Emergency Use Authorization (EUA). This EUA will remain in effect (meaning this test can be used)  for the duration of the COVID-19 declaration under Section 564(b)(1) of the Act, 21 U.S.C. section 360bbb-3(b)(1), unless the authorization is terminated or revoked.  Performed at [facility], [place] [address], [place], [place] [address]    POC Amphetamine UR 10/18/2021 None Detected  NONE DETECTED (Cut Off Level 1000 ng/mL) Final   POC Secobarbital (BAR) 10/18/2021 None Detected  NONE DETECTED (Cut Off Level 300 ng/mL) Final   POC Buprenorphine (BUP) 10/18/2021 None Detected  NONE DETECTED (Cut Off Level 10 ng/mL) Final   POC Oxazepam (BZO) 10/18/2021 None Detected  NONE DETECTED (Cut Off Level 300 ng/mL) Final   POC Cocaine UR 10/18/2021 None Detected  NONE DETECTED (Cut Off Level 300 ng/mL) Final   POC Methamphetamine UR 10/18/2021 None Detected  NONE DETECTED (Cut Off Level 1000 ng/mL) Final   POC Morphine 10/18/2021 None Detected  NONE DETECTED (Cut Off Level 300 ng/mL) Final   POC Methadone UR 10/18/2021 None Detected  NONE DETECTED (Cut Off Level 300 ng/mL) Final   POC Oxycodone UR 10/18/2021 None Detected  NONE DETECTED (Cut Off Level 100 ng/mL) Final   POC Marijuana UR 10/18/2021 None Detected  NONE DETECTED (Cut Off Level 50 ng/mL) Final   SARSCOV2ONAVIRUS 2 AG 10/18/2021 NEGATIVE  NEGATIVE Final   Comment: (NOTE) SARS-CoV-2 antigen NOT DETECTED.   Negative results are presumptive.  Negative results do not preclude SARS-CoV-2 infection and should not be used as the sole basis for treatment or other patient management decisions, including infection  control decisions, particularly in the presence of clinical signs and  symptoms consistent with COVID-19, or in those who have been in contact with the virus.  Negative results must be combined with clinical observations, patient history, and epidemiological information. The expected result is Negative.  Fact Sheet for Patients: HandmadeRecipes.com.cy  Fact Sheet for Healthcare Providers: FuneralLife.at  This test is not yet approved or cleared by the [place] FDA and  has been authorized for detection and/or diagnosis of SARS-CoV-2 by FDA under an Emergency Use Authorization (EUA).   This EUA will remain in effect (meaning this test can be used) for the duration of  the COV                          ID-19 declaration under Section 564(b)(1) of the Act, 21 U.S.C. section 360bbb-3(b)(1), unless the authorization is terminated or revoked sooner.     Preg Test, Ur 10/18/2021 NEGATIVE  NEGATIVE Final   Comment:        THE SENSITIVITY OF THIS METHODOLOGY IS >24 mIU/mL   Admission on 10/11/2021, Discharged on 10/14/2021  Component Date Value Ref Range Status   SARS Coronavirus 2 by RT PCR 10/11/2021 NEGATIVE  NEGATIVE Final   Comment: (NOTE) SARS-CoV-2 target nucleic acids are NOT DETECTED.  The SARS-CoV-2 RNA is generally detectable in upper respiratory specimens during the acute phase of infection. The lowest concentration of SARS-CoV-2 viral copies this assay can detect is 138 copies/mL. A negative result does not preclude SARS-Cov-2 infection and should not be used as the sole basis for treatment or other patient management decisions. A negative result may occur with  improper specimen collection/handling, submission of specimen other than nasopharyngeal swab, presence of viral mutation(s) within the areas targeted by this assay, and inadequate number of viral copies(<138 copies/mL). A negative result must be combined with clinical observations, patient history, and epidemiological information. The expected result is Negative.  Fact Sheet for Patients:  EntrepreneurPulse.com.au  Fact Sheet for Healthcare Providers:  IncredibleEmployment.be  This test is no                          t yet approved or cleared by the [place] FDA and  has been authorized for detection and/or diagnosis of SARS-CoV-2 by FDA under an Emergency Use Authorization (EUA). This EUA will remain  in effect (meaning this test can be used) for the duration of the COVID-19 declaration under Section 564(b)(1) of the Act, 21 U.S.C.section  360bbb-3(b)(1), unless the authorization is terminated  or revoked sooner.       Influenza A by PCR 10/11/2021 NEGATIVE  NEGATIVE Final   Influenza B by PCR 10/11/2021 NEGATIVE  NEGATIVE Final   Comment: (NOTE) The Xpert Xpress SARS-CoV-2/FLU/RSV plus assay is intended as an aid in the diagnosis of influenza from Nasopharyngeal swab specimens and should not be used as a sole basis for treatment. Nasal washings and aspirates are unacceptable for Xpert Xpress SARS-CoV-2/FLU/RSV testing.  Fact Sheet for Patients: EntrepreneurPulse.com.au  Fact Sheet for Healthcare Providers: IncredibleEmployment.be  This test is not yet approved or cleared by the [place] FDA and has been authorized for detection and/or diagnosis of SARS-CoV-2 by FDA under an Emergency Use Authorization (EUA). This EUA will remain in effect (meaning this test can be used) for the duration of the COVID-19 declaration under Section 564(b)(1) of the Act, 21 U.S.C. section 360bbb-3(b)(1), unless the authorization is terminated or revoked.  Performed at [facility], [place] [address] [place], [place] [address]    Sodium 10/11/2021 137  135 - 145 mmol/L Final   Potassium 10/11/2021 3.6  3.5 - 5.1 mmol/L Final   Chloride 10/11/2021 105  98 - 111 mmol/L Final   CO2 10/11/2021 24  22 - 32 mmol/L Final   Glucose, Bld 10/11/2021 87  70 - 99 mg/dL Final   Glucose reference range applies only to samples taken after fasting for at least 8 hours.   BUN 10/11/2021 11  6 - 20 mg/dL Final   Creatinine, Ser 10/11/2021 0.78  0.44 - 1.00 mg/dL Final   Calcium 10/11/2021 9.1  8.9 - 10.3 mg/dL Final   Total Protein 10/11/2021 6.7  6.5 - 8.1 g/dL Final   Albumin 10/11/2021 3.8  3.5 - 5.0 g/dL Final   AST 10/11/2021 18  15 - 41 U/L Final   ALT 10/11/2021 16  0 - 44 U/L Final   Alkaline Phosphatase 10/11/2021 65  38 - 126 U/L Final   Total Bilirubin 10/11/2021 <0.1 (L)  0.3 - 1.2 mg/dL  Final   GFR, Estimated 10/11/2021 >60  >60 mL/min Final   Comment: (NOTE) Calculated using the CKD-EPI Creatinine Equation (2021)    Anion gap 10/11/2021 8  5 - 15 Final   Performed at [facility] [address], [place], [place] [address]   Alcohol, Ethyl (B) 10/11/2021 <10  <10 mg/dL Final   Comment: (NOTE) Lowest detectable limit for serum alcohol is 10 mg/dL.  For medical purposes only. Performed at [facility], [place] [address], [place], [place] [address]    Opiates 10/11/2021 NONE DETECTED  NONE DETECTED Final   Cocaine 10/11/2021 NONE DETECTED  NONE DETECTED Final   Benzodiazepines 10/11/2021 NONE DETECTED  NONE DETECTED Final   Amphetamines 10/11/2021 NONE DETECTED  NONE DETECTED Final   Tetrahydrocannabinol 10/11/2021 NONE DETECTED  NONE DETECTED Final   Barbiturates 10/11/2021 NONE DETECTED  NONE DETECTED Final   Comment: (NOTE) DRUG SCREEN FOR MEDICAL PURPOSES ONLY.  IF CONFIRMATION IS NEEDED FOR ANY PURPOSE, NOTIFY LAB WITHIN 5 DAYS.  LOWEST DETECTABLE LIMITS FOR URINE DRUG SCREEN Drug Class                     Cutoff (ng/mL) Amphetamine and metabolites    1000 Barbiturate and metabolites    200 Benzodiazepine                 622 Tricyclics and metabolites     300 Opiates and metabolites        300 Cocaine and metabolites        300 THC                            50 Performed at [facility], [place] [address] [place], [place] [address]    WBC 10/11/2021 8.2  4.0 - 10.5 K/uL Final   RBC 10/11/2021 4.32  3.87 - 5.11 MIL/uL Final   Hemoglobin 10/11/2021 11.2 (L)  12.0 - 15.0 g/dL Final   HCT 10/11/2021 36.5  36.0 - 46.0 % Final   MCV 10/11/2021 84.5  80.0 - 100.0 fL Final   MCH 10/11/2021 25.9 (L)  26.0 - 34.0 pg Final   MCHC 10/11/2021 30.7  30.0 - 36.0 g/dL Final   RDW 10/11/2021 13.8  11.5 - 15.5 % Final   Platelets 10/11/2021 285  150 - 400 K/uL Final   nRBC 10/11/2021 0.0  0.0 - 0.2 % Final   Neutrophils Relative % 10/11/2021 59  %  Final   Neutro Abs 10/11/2021 5.0  1.7 - 7.7 K/uL Final   Lymphocytes Relative 10/11/2021 31  % Final   Lymphs Abs 10/11/2021 2.5  0.7 - 4.0 K/uL Final   Monocytes Relative 10/11/2021 7  % Final   Monocytes Absolute 10/11/2021 0.6  0.1 - 1.0 K/uL Final   Eosinophils Relative 10/11/2021 1  % Final   Eosinophils Absolute 10/11/2021 0.1  0.0 - 0.5 K/uL Final   Basophils Relative 10/11/2021 1  % Final   Basophils Absolute 10/11/2021 0.0  0.0 - 0.1 K/uL Final   Immature Granulocytes 10/11/2021 1  % Final   Abs Immature Granulocytes 10/11/2021 0.04  0.00 - 0.07 K/uL Final   Performed at [facility], [place] [address], [place], [place] [address]   I-stat hCG, quantitative 10/12/2021 <5.0  <5 mIU/mL Final   Comment 3 10/12/2021          Final   Comment:   GEST. AGE      CONC.  (mIU/mL)   <=1 WEEK        5 - 50     2 WEEKS       50 - 500     3 WEEKS       100 - 10,000     4 WEEKS     1,000 - 30,000        FEMALE AND NON-PREGNANT FEMALE:     LESS THAN 5 mIU/mL   Admission on 10/06/2021, Discharged on 10/10/2021  Component Date Value Ref Range Status   Iron 10/07/2021 50  28 - 170 ug/dL Final   TIBC 10/07/2021 361  250 - 450 ug/dL Final   Saturation Ratios 10/07/2021 14  10.4 - 31.8 % Final   UIBC 10/07/2021 311  ug/dL Final   Performed at [facility], [place] [address] [place], [place] [address]   Ferritin 10/07/2021 8 (L)  11 - 307 ng/mL Final   Performed at [facility] [address] [place], [place] [address]   Retic Ct Pct 10/07/2021 1.7  0.4 - 3.1 % Final   RBC. 10/07/2021 4.07  3.87 - 5.11 MIL/uL Final   Retic Count, Absolute 10/07/2021 70.0  19.0 - 186.0 K/uL Final   Immature Retic Fract 10/07/2021 17.1 (H)  2.3 - 15.9 % Final   Performed at [facility], [place] [address], [place], [place] [address]   Cholesterol 10/07/2021 148  0 - 200 mg/dL Final   Triglycerides 10/07/2021 173 (H)  <150 mg/dL Final   HDL 10/07/2021 39  (L)  >40 mg/dL Final   Total CHOL/HDL Ratio 10/07/2021 3.8  RATIO Final   VLDL 10/07/2021 35  0 - 40 mg/dL Final   LDL Cholesterol 10/07/2021 74  0 - 99 mg/dL Final   Comment:        Total Cholesterol/HDL:CHD Risk Coronary Heart Disease Risk Table                     Men   Women  1/2 Average Risk   3.4   3.3  Average Risk       5.0   4.4  2 X Average Risk   9.6   7.1  3 X Average Risk  23.4   11.0        Use the calculated Patient Ratio above and the CHD Risk Table to determine the patient's CHD Risk.        ATP III CLASSIFICATION (LDL):  <100     mg/dL   Optimal  100-129  mg/dL   Near or Above                    Optimal  130-159  mg/dL   Borderline  160-189  mg/dL   High  >190     mg/dL   Very High Performed at [facility] [address] [place], [name] [address]    TSH 10/07/2021 1.511  0.350 - 4.500 uIU/mL Final   Comment: Performed by a 3rd Generation assay with a functional sensitivity of <=0.01 uIU/mL. Performed at [facility], [place] [address] [place], [place] [address]    Hgb A1c MFr Bld 10/07/2021 5.0  4.8 - 5.6 % Final   Comment: (NOTE) Pre diabetes:          5.7%-6.4%  Diabetes:              >6.4%  Glycemic control for   <7.0% adults with diabetes    Mean Plasma Glucose 10/07/2021 96.8  mg/dL Final   Performed at [facility], [place] [address] [place], [place] [address]   SARS Coronavirus 2 by RT PCR 10/08/2021 NEGATIVE  NEGATIVE Final   Comment: (NOTE) SARS-CoV-2 target nucleic acids are NOT DETECTED.  The SARS-CoV-2 RNA is generally detectable in upper respiratory specimens during the acute phase of infection. The lowest concentration of SARS-CoV-2 viral copies this assay can detect is 138 copies/mL. A negative result does not preclude SARS-Cov-2 infection and should not be used as the sole basis for treatment or other patient management decisions. A negative result may occur with  improper specimen  collection/handling, submission of specimen other than nasopharyngeal swab, presence of viral mutation(s) within the areas targeted by this assay, and inadequate number of  viral copies(<138 copies/mL). A negative result must be combined with clinical observations, patient history, and epidemiological information. The expected result is Negative.  Fact Sheet for Patients:  EntrepreneurPulse.com.au  Fact Sheet for Healthcare Providers:  IncredibleEmployment.be  This test is no                          t yet approved or cleared by the [place] FDA and  has been authorized for detection and/or diagnosis of SARS-CoV-2 by FDA under an Emergency Use Authorization (EUA). This EUA will remain  in effect (meaning this test can be used) for the duration of the COVID-19 declaration under Section 564(b)(1) of the Act, 21 U.S.C.section 360bbb-3(b)(1), unless the authorization is terminated  or revoked sooner.       Influenza A by PCR 10/08/2021 NEGATIVE  NEGATIVE Final   Influenza B by PCR 10/08/2021 NEGATIVE  NEGATIVE Final   Comment: (NOTE) The Xpert Xpress SARS-CoV-2/FLU/RSV plus assay is intended as an aid in the diagnosis of influenza from Nasopharyngeal swab specimens and should not be used as a sole basis for treatment. Nasal washings and aspirates are unacceptable for Xpert Xpress SARS-CoV-2/FLU/RSV testing.  Fact Sheet for Patients: EntrepreneurPulse.com.au  Fact Sheet for Healthcare Providers: IncredibleEmployment.be  This test is not yet approved or cleared by the [place] FDA and has been authorized for detection and/or diagnosis of SARS-CoV-2 by FDA under an Emergency Use Authorization (EUA). This EUA will remain in effect (meaning this test can be used) for the duration of the COVID-19 declaration under Section 564(b)(1) of the Act, 21 U.S.C. section 360bbb-3(b)(1), unless the authorization is  terminated or revoked.  Performed at [facility], [place] [address], [place], [place] [address]   Admission on 10/04/2021, Discharged on 10/06/2021  Component Date Value Ref Range Status   WBC 10/04/2021 7.3  4.0 - 10.5 K/uL Final   RBC 10/04/2021 4.19  3.87 - 5.11 MIL/uL Final   Hemoglobin 10/04/2021 10.9 (L)  12.0 - 15.0 g/dL Final   HCT 10/04/2021 33.9 (L)  36.0 - 46.0 % Final   MCV 10/04/2021 80.9  80.0 - 100.0 fL Final   MCH 10/04/2021 26.0  26.0 - 34.0 pg Final   MCHC 10/04/2021 32.2  30.0 - 36.0 g/dL Final   RDW 10/04/2021 13.2  11.5 - 15.5 % Final   Platelets 10/04/2021 124 (L)  150 - 400 K/uL Final   REPEATED TO VERIFY   nRBC 10/04/2021 0.0  0.0 - 0.2 % Final   Neutrophils Relative % 10/04/2021 63  % Final   Neutro Abs 10/04/2021 4.6  1.7 - 7.7 K/uL Final   Lymphocytes Relative 10/04/2021 31  % Final   Lymphs Abs 10/04/2021 2.3  0.7 - 4.0 K/uL Final   Monocytes Relative 10/04/2021 5  % Final   Monocytes Absolute 10/04/2021 0.3  0.1 - 1.0 K/uL Final   Eosinophils Relative 10/04/2021 0  % Final   Eosinophils Absolute 10/04/2021 0.0  0.0 - 0.5 K/uL Final   Basophils Relative 10/04/2021 1  % Final   Basophils Absolute 10/04/2021 0.0  0.0 - 0.1 K/uL Final   Immature Granulocytes 10/04/2021 0  % Final   Abs Immature Granulocytes 10/04/2021 0.01  0.00 - 0.07 K/uL Final   Performed at [facility], [place] [address], [place], [place] [address]   Sodium 10/04/2021 137  135 - 145 mmol/L Final   Potassium 10/04/2021 4.1  3.5 - 5.1 mmol/L Final  Chloride 10/04/2021 107  98 - 111 mmol/L Final   CO2 10/04/2021 26  22 - 32 mmol/L Final   Glucose, Bld 10/04/2021 105 (H)  70 - 99 mg/dL Final   Glucose reference range applies only to samples taken after fasting for at least 8 hours.   BUN 10/04/2021 <5 (L)  6 - 20 mg/dL Final   Creatinine, Ser 10/04/2021 0.78  0.44 - 1.00 mg/dL Final   Calcium 10/04/2021 8.9  8.9 - 10.3 mg/dL Final   Total Protein 10/04/2021 6.2  (L)  6.5 - 8.1 g/dL Final   Albumin 10/04/2021 3.8  3.5 - 5.0 g/dL Final   AST 10/04/2021 22  15 - 41 U/L Final   ALT 10/04/2021 13  0 - 44 U/L Final   Alkaline Phosphatase 10/04/2021 61  38 - 126 U/L Final   Total Bilirubin 10/04/2021 0.6  0.3 - 1.2 mg/dL Final   GFR, Estimated 10/04/2021 >60  >60 mL/min Final   Comment: (NOTE) Calculated using the CKD-EPI Creatinine Equation (2021)    Anion gap 10/04/2021 4 (L)  5 - 15 Final   Performed at [facility] [address] [place], [place] [address]   Acetaminophen (Tylenol), Serum 10/04/2021 <10 (L)  10 - 30 ug/mL Final   Comment: (NOTE) Therapeutic concentrations vary significantly. A range of 10-30 ug/mL  may be an effective concentration for many patients. However, some  are best treated at concentrations outside of this range. Acetaminophen concentrations >150 ug/mL at 4 hours after ingestion  and >50 ug/mL at 12 hours after ingestion are often associated with  toxic reactions.  Performed at [facility], [place] [address], [place], [place] [address]    Salicylate Lvl 32/67/1245 <7.0 (L)  7.0 - 30.0 mg/dL Final   Performed at [facility] [address], [place], [place] [address]   Opiates 10/04/2021 NONE DETECTED  NONE DETECTED Final   Cocaine 10/04/2021 NONE DETECTED  NONE DETECTED Final   Benzodiazepines 10/04/2021 NONE DETECTED  NONE DETECTED Final   Amphetamines 10/04/2021 NONE DETECTED  NONE DETECTED Final   Tetrahydrocannabinol 10/04/2021 NONE DETECTED  NONE DETECTED Final   Barbiturates 10/04/2021 NONE DETECTED  NONE DETECTED Final   Comment: (NOTE) DRUG SCREEN FOR MEDICAL PURPOSES ONLY.  IF CONFIRMATION IS NEEDED FOR ANY PURPOSE, NOTIFY LAB WITHIN 5 DAYS.  LOWEST DETECTABLE LIMITS FOR URINE DRUG SCREEN Drug Class                     Cutoff (ng/mL) Amphetamine and metabolites    1000 Barbiturate and metabolites    200 Benzodiazepine                 338 Tricyclics and metabolites     300 Opiates  and metabolites        300 Cocaine and metabolites        300 THC                            50 Performed at [facility], [place] [address] [place], [place] [address]    Alcohol, Ethyl (B) 10/04/2021 <10  <10 mg/dL Final   Comment: (NOTE) Lowest detectable limit for serum alcohol is 10 mg/dL.  For medical purposes only. Performed at [facility], [place] [address], [place], [place] [address]    I-stat hCG, quantitative 10/05/2021 <5.0  <5 mIU/mL Final   Comment 3  10/05/2021          Final   Comment:   GEST. AGE      CONC.  (mIU/mL)   <=1 WEEK        5 - 50     2 WEEKS       50 - 500     3 WEEKS       100 - 10,000     4 WEEKS     1,000 - 30,000        FEMALE AND NON-PREGNANT FEMALE:     LESS THAN 5 mIU/mL    Acetaminophen (Tylenol), Serum 10/05/2021 <10 (L)  10 - 30 ug/mL Final   Comment: (NOTE) Therapeutic concentrations vary significantly. A range of 10-30 ug/mL  may be an effective concentration for many patients. However, some  are best treated at concentrations outside of this range. Acetaminophen concentrations >150 ug/mL at 4 hours after ingestion  and >50 ug/mL at 12 hours after ingestion are often associated with  toxic reactions.  Performed at [facility], [place] [address] [place], [place] [address]    SARS Coronavirus 2 by RT PCR 10/05/2021 NEGATIVE  NEGATIVE Final   Comment: (NOTE) SARS-CoV-2 target nucleic acids are NOT DETECTED.  The SARS-CoV-2 RNA is generally detectable in upper respiratory specimens during the acute phase of infection. The lowest concentration of SARS-CoV-2 viral copies this assay can detect is 138 copies/mL. A negative result does not preclude SARS-Cov-2 infection and should not be used as the sole basis for treatment or other patient management decisions. A negative result may occur with  improper specimen collection/handling, submission of specimen other than nasopharyngeal swab, presence of viral mutation(s) within  the areas targeted by this assay, and inadequate number of viral copies(<138 copies/mL). A negative result must be combined with clinical observations, patient history, and epidemiological information. The expected result is Negative.  Fact Sheet for Patients:  EntrepreneurPulse.com.au  Fact Sheet for Healthcare Providers:  IncredibleEmployment.be  This test is no                          t yet approved or cleared by the [place] FDA and  has been authorized for detection and/or diagnosis of SARS-CoV-2 by FDA under an Emergency Use Authorization (EUA). This EUA will remain  in effect (meaning this test can be used) for the duration of the COVID-19 declaration under Section 564(b)(1) of the Act, 21 U.S.C.section 360bbb-3(b)(1), unless the authorization is terminated  or revoked sooner.       Influenza A by PCR 10/05/2021 NEGATIVE  NEGATIVE Final   Influenza B by PCR 10/05/2021 NEGATIVE  NEGATIVE Final   Comment: (NOTE) The Xpert Xpress SARS-CoV-2/FLU/RSV plus assay is intended as an aid in the diagnosis of influenza from Nasopharyngeal swab specimens and should not be used as a sole basis for treatment. Nasal washings and aspirates are unacceptable for Xpert Xpress SARS-CoV-2/FLU/RSV testing.  Fact Sheet for Patients: EntrepreneurPulse.com.au  Fact Sheet for Healthcare Providers: IncredibleEmployment.be  This test is not yet approved or cleared by the [place] FDA and has been authorized for detection and/or diagnosis of SARS-CoV-2 by FDA under an Emergency Use Authorization (EUA). This EUA will remain in effect (meaning this test can be used) for the duration of the COVID-19 declaration under Section 564(b)(1) of the Act, 21 U.S.C. section 360bbb-3(b)(1), unless the authorization is terminated or revoked.  Performed at [facility], [place]  [address], [place], [place] [address]    Admission on 09/02/2021, Discharged on 09/04/2021  Component Date Value Ref Range Status   SARS Coronavirus 2 by RT PCR 09/02/2021 NEGATIVE  NEGATIVE Final   Comment: (NOTE) SARS-CoV-2 target nucleic acids are NOT DETECTED.  The SARS-CoV-2 RNA is generally detectable in upper respiratory specimens during the acute phase of infection. The lowest concentration of SARS-CoV-2 viral copies this assay can detect is 138 copies/mL. A negative result does not preclude SARS-Cov-2 infection and should not be used as the sole basis for treatment or other patient management decisions. A negative result may occur with  improper specimen collection/handling, submission of specimen other than nasopharyngeal swab, presence of viral mutation(s) within the areas targeted by this assay, and inadequate number of viral copies(<138 copies/mL). A negative result must be combined with clinical observations, patient history, and epidemiological information. The expected result is Negative.  Fact Sheet for Patients:  EntrepreneurPulse.com.au  Fact Sheet for Healthcare Providers:  IncredibleEmployment.be  This test is no                          t yet approved or cleared by the [place] FDA and  has been authorized for detection and/or diagnosis of SARS-CoV-2 by FDA under an Emergency Use Authorization (EUA). This EUA will remain  in effect (meaning this test can be used) for the duration of the COVID-19 declaration under Section 564(b)(1) of the Act, 21 U.S.C.section 360bbb-3(b)(1), unless the authorization is terminated  or revoked sooner.       Influenza A by PCR 09/02/2021 NEGATIVE  NEGATIVE Final   Influenza B by PCR 09/02/2021 NEGATIVE  NEGATIVE Final   Comment: (NOTE) The Xpert Xpress SARS-CoV-2/FLU/RSV plus assay is intended as an aid in the diagnosis of influenza from Nasopharyngeal swab specimens and should not be used as a sole basis for treatment.  Nasal washings and aspirates are unacceptable for Xpert Xpress SARS-CoV-2/FLU/RSV testing.  Fact Sheet for Patients: EntrepreneurPulse.com.au  Fact Sheet for Healthcare Providers: IncredibleEmployment.be  This test is not yet approved or cleared by the [place] FDA and has been authorized for detection and/or diagnosis of SARS-CoV-2 by FDA under an Emergency Use Authorization (EUA). This EUA will remain in effect (meaning this test can be used) for the duration of the COVID-19 declaration under Section 564(b)(1) of the Act, 21 U.S.C. section 360bbb-3(b)(1), unless the authorization is terminated or revoked.  Performed at [facility], [place] [address], [place], [place] [address]    WBC 09/02/2021 8.5  4.0 - 10.5 K/uL Final   RBC 09/02/2021 4.76  3.87 - 5.11 MIL/uL Final   Hemoglobin 09/02/2021 12.4  12.0 - 15.0 g/dL Final   HCT 09/02/2021 38.4  36.0 - 46.0 % Final   MCV 09/02/2021 80.7  80.0 - 100.0 fL Final   MCH 09/02/2021 26.1  26.0 - 34.0 pg Final   MCHC 09/02/2021 32.3  30.0 - 36.0 g/dL Final   RDW 09/02/2021 13.2  11.5 - 15.5 % Final   Platelets 09/02/2021 295  150 - 400 K/uL Final   nRBC 09/02/2021 0.0  0.0 - 0.2 % Final   Neutrophils Relative % 09/02/2021 56  % Final   Neutro Abs 09/02/2021 4.8  1.7 - 7.7 K/uL Final   Lymphocytes Relative 09/02/2021 36  % Final   Lymphs Abs 09/02/2021 3.1  0.7 - 4.0 K/uL Final   Monocytes Relative 09/02/2021 6  % Final   Monocytes Absolute  09/02/2021 0.5  0.1 - 1.0 K/uL Final   Eosinophils Relative 09/02/2021 1  % Final   Eosinophils Absolute 09/02/2021 0.1  0.0 - 0.5 K/uL Final   Basophils Relative 09/02/2021 1  % Final   Basophils Absolute 09/02/2021 0.0  0.0 - 0.1 K/uL Final   Immature Granulocytes 09/02/2021 0  % Final   Abs Immature Granulocytes 09/02/2021 0.02  0.00 - 0.07 K/uL Final   Performed at [facility], [place] [address], [place], [place] [address]   Sodium 09/02/2021  137  135 - 145 mmol/L Final   Potassium 09/02/2021 3.6  3.5 - 5.1 mmol/L Final   Chloride 09/02/2021 102  98 - 111 mmol/L Final   CO2 09/02/2021 28  22 - 32 mmol/L Final   Glucose, Bld 09/02/2021 92  70 - 99 mg/dL Final   Glucose reference range applies only to samples taken after fasting for at least 8 hours.   BUN 09/02/2021 6  6 - 20 mg/dL Final   Creatinine, Ser 09/02/2021 0.72  0.44 - 1.00 mg/dL Final   Calcium 09/02/2021 10.1  8.9 - 10.3 mg/dL Final   Total Protein 09/02/2021 7.5  6.5 - 8.1 g/dL Final   Albumin 09/02/2021 4.2  3.5 - 5.0 g/dL Final   AST 09/02/2021 17  15 - 41 U/L Final   ALT 09/02/2021 16  0 - 44 U/L Final   Alkaline Phosphatase 09/02/2021 70  38 - 126 U/L Final   Total Bilirubin 09/02/2021 0.4  0.3 - 1.2 mg/dL Final   GFR, Estimated 09/02/2021 >60  >60 mL/min Final   Comment: (NOTE) Calculated using the CKD-EPI Creatinine Equation (2021)    Anion gap 09/02/2021 7  5 - 15 Final   Performed at [facility] [address], [place], [place] [address]   Hgb A1c MFr Bld 09/02/2021 5.0  4.8 - 5.6 % Final   Comment: (NOTE) Pre diabetes:          5.7%-6.4%  Diabetes:              >6.4%  Glycemic control for   <7.0% adults with diabetes    Mean Plasma Glucose 09/02/2021 96.8  mg/dL Final   Performed at [facility] [address], [place], [place] [address]   TSH 09/02/2021 2.382  0.350 - 4.500 uIU/mL Final   Comment: Performed by a 3rd Generation assay with a functional sensitivity of <=0.01 uIU/mL. Performed at [facility], [place] [address], [place], [place] [address]    POC Amphetamine UR 09/02/2021 None Detected  NONE DETECTED (Cut Off Level 1000 ng/mL) Preliminary   POC Secobarbital (BAR) 09/02/2021 None Detected  NONE DETECTED (Cut Off Level 300 ng/mL) Preliminary   POC Buprenorphine (BUP) 09/02/2021 None Detected  NONE DETECTED (Cut Off Level 10 ng/mL) Preliminary   POC Oxazepam (BZO) 09/02/2021 None Detected  NONE DETECTED (Cut Off Level  300 ng/mL) Preliminary   POC Cocaine UR 09/02/2021 None Detected  NONE DETECTED (Cut Off Level 300 ng/mL) Preliminary   POC Methamphetamine UR 09/02/2021 None Detected  NONE DETECTED (Cut Off Level 1000 ng/mL) Preliminary   POC Morphine 09/02/2021 None Detected  NONE DETECTED (Cut Off Level 300 ng/mL) Preliminary   POC Methadone UR 09/02/2021 None Detected  NONE DETECTED (Cut Off Level 300 ng/mL) Preliminary   POC Oxycodone UR 09/02/2021 None Detected  NONE DETECTED (Cut Off Level 100 ng/mL) Preliminary   POC Marijuana UR 09/02/2021 None Detected  NONE DETECTED (Cut  Off Level 50 ng/mL) Preliminary   SARSCOV2ONAVIRUS 2 AG 09/02/2021 NEGATIVE  NEGATIVE Final   Comment: (NOTE) SARS-CoV-2 antigen NOT DETECTED.   Negative results are presumptive.  Negative results do not preclude SARS-CoV-2 infection and should not be used as the sole basis for treatment or other patient management decisions, including infection  control decisions, particularly in the presence of clinical signs and  symptoms consistent with COVID-19, or in those who have been in contact with the virus.  Negative results must be combined with clinical observations, patient history, and epidemiological information. The expected result is Negative.  Fact Sheet for Patients: HandmadeRecipes.com.cy  Fact Sheet for Healthcare Providers: FuneralLife.at  This test is not yet approved or cleared by the [place] FDA and  has been authorized for detection and/or diagnosis of SARS-CoV-2 by FDA under an Emergency Use Authorization (EUA).  This EUA will remain in effect (meaning this test can be used) for the duration of  the COV                          ID-19 declaration under Section 564(b)(1) of the Act, 21 U.S.C. section 360bbb-3(b)(1), unless the authorization is terminated or revoked sooner.     Preg Test, Ur 09/02/2021 NEGATIVE  NEGATIVE Final   Comment:        THE  SENSITIVITY OF THIS METHODOLOGY IS >24 mIU/mL   There may be more visits with results that are not included.    Allergies: Bee venom, Coconut flavor, Fish allergy, Geodon [ziprasidone hcl], Haloperidol and related, Lithobid [lithium], Roxicodone [oxycodone], Seroquel [quetiapine], Shellfish allergy, Phenergan [promethazine hcl], Prilosec [omeprazole], Sulfa antibiotics, Tegretol [carbamazepine], Prozac [fluoxetine], Tape, and Tylenol [acetaminophen]  PTA Medications: (Not in a hospital admission)   Medical Decision Making  Patient says that she is unable to contract for safety at this time.  Patient will be [facility] to [facility] for continuous assessment with follow-up by psychiatry on 12-11-2021 for disposition.  Lab Orders         Resp Panel by RT-PCR (Flu A&B, Covid) Anterior Nasal Swab         CBC with Differential/Platelet         Comprehensive metabolic panel         Lipid panel         hCG, quantitative, pregnancy         TSH         POCT Urine Drug Screen - (I-Screen)         POC SARS Coronavirus 2 Ag     Pregnancy  Beta HCG is 32mU/mL--will resume home medication as appropriate      Recommendations  Based on my evaluation the patient does not appear to have an emergency medical condition.  EOphelia Shoulder NP 12/11/21  4:44 AM
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
## 2021-12-10 NOTE — Progress Notes (Signed)
   12/10/21 1940  [place] Triage Screening (Walk-ins at [facility] only)  How Did You Hear About [name]? Self  What Is the Reason for Your Visit/Call Today? Pt states she called law enforcement and asked to be transported to [facility]. She was recently discharged from [facility] and reports feeling depressed and suicidal. She says, "I don't want to kill myself, but I do." She reports thoughts of "running through glass", stabbing herself with a knife, or overdosing on prescription medications. She cannot contract for safety to return home. She endorses auditory hallucinations of murmuring voices. She denies homicidal ideation. She denies recent alcohol or substance use. She identifies numerous stressors including relationship problems, cardiac issues, and thoughts she may be pregnant.  How Long Has This Been Causing You Problems? <Week  Have You Recently Had Any Thoughts About Hurting Yourself? Yes  How long ago did you have thoughts about hurting yourself? Currently having suicidal thoughts  Are You Planning to Commit Suicide/Harm Yourself At This time? Yes  Have you Recently Had Thoughts About Hurting Someone [name]? No  How long ago did you have thoughts of harming others? NA  Are You Planning To Harm Someone At This Time? No  Are you currently experiencing any auditory, visual or other hallucinations? Yes  Please explain the hallucinations you are currently experiencing: Pt reports hearing murmuring voices that she cannot understand.  Have You Used Any Alcohol or Drugs in the Past 24 Hours? No  What Did You Use and How Much? NA  Do you have any current medical co-morbidities that require immediate attention? No  Please describe current medical co-morbidities that require immediate attention: None  Clinician description of patient physical appearance/behavior: Pt is casually dressed. She is alert and oriented x4. Pt speaks in a clear tone, at moderate volume and fast pace. Motor behavior appears normal. Eye contact  is fair. Pt's mood is depressed and anxious, affect is congruent with mood. Thought process is coherent and relevant. There is no indication Pt is currently responding to internal stimuli or experiencing delusional thought content. She is cooperative.  What Do You Feel Would Help You the Most Today? Treatment for Depression or other mood problem;Medication(s)  If access to [facility] Urgent Care was not available, would you have sought care in the Emergency Department? Yes  Determination of Need Urgent (48 hours)  Options For Referral Inpatient Hospitalization;BH Urgent Care;Outpatient Therapy;Medication Management

## 2021-12-11 ENCOUNTER — Encounter (HOSPITAL_COMMUNITY): Payer: Self-pay | Admitting: Registered Nurse

## 2021-12-11 DIAGNOSIS — F603 Borderline personality disorder: Secondary | ICD-10-CM | POA: Diagnosis not present

## 2021-12-11 DIAGNOSIS — F4310 Post-traumatic stress disorder, unspecified: Secondary | ICD-10-CM | POA: Diagnosis not present

## 2021-12-11 DIAGNOSIS — F250 Schizoaffective disorder, bipolar type: Secondary | ICD-10-CM | POA: Diagnosis not present

## 2021-12-11 DIAGNOSIS — F1020 Alcohol dependence, uncomplicated: Secondary | ICD-10-CM | POA: Diagnosis not present

## 2021-12-11 MED ORDER — QUETIAPINE FUMARATE 25 MG PO TABS: 12.5000 mg | ORAL_TABLET | Freq: Two times a day (BID) | ORAL | 0 refills | Status: DC

## 2021-12-11 MED ORDER — CARIPRAZINE HCL 1.5 MG PO CAPS: 1.5000 mg | ORAL_CAPSULE | Freq: Every day | ORAL | 0 refills | Status: DC

## 2021-12-11 NOTE — ED Notes (Signed)
Pt resting quietly, breathing is even and unlabored.  Pt denies SI, HI, pain and AVH.  Pt states " I feel great and want to go home."  She also states " it has been a rough week and I needed somewhere to stay overnight." Will continue to monitor for safety.
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
## 2021-12-11 NOTE — ED Notes (Signed)
Pt sleeping in recliner bed. No noted distress. Will continue to monitor for safety

## 2021-12-12 ENCOUNTER — Emergency Department (HOSPITAL_COMMUNITY)
Admission: EM | Admit: 2021-12-12 | Discharge: 2021-12-13 | Disposition: A | Discharge status: Home / Self Care | Payer: PPO | Attending: Emergency Medicine | Admitting: Emergency Medicine

## 2021-12-12 ENCOUNTER — Other Ambulatory Visit: Payer: Self-pay

## 2021-12-12 ENCOUNTER — Emergency Department (HOSPITAL_COMMUNITY): Payer: PPO

## 2021-12-12 ENCOUNTER — Encounter (HOSPITAL_COMMUNITY): Payer: Self-pay | Admitting: Emergency Medicine

## 2021-12-12 DIAGNOSIS — J45909 Unspecified asthma, uncomplicated: Secondary | ICD-10-CM | POA: Insufficient documentation

## 2021-12-12 DIAGNOSIS — R072 Precordial pain: Secondary | ICD-10-CM | POA: Diagnosis present

## 2021-12-12 DIAGNOSIS — R079 Chest pain, unspecified: Secondary | ICD-10-CM | POA: Diagnosis not present

## 2021-12-12 DIAGNOSIS — G4489 Other headache syndrome: Secondary | ICD-10-CM | POA: Diagnosis not present

## 2021-12-12 DIAGNOSIS — R0789 Other chest pain: Secondary | ICD-10-CM | POA: Diagnosis not present

## 2021-12-12 DIAGNOSIS — K029 Dental caries, unspecified: Secondary | ICD-10-CM | POA: Insufficient documentation

## 2021-12-12 DIAGNOSIS — Z7951 Long term (current) use of inhaled steroids: Secondary | ICD-10-CM | POA: Insufficient documentation

## 2021-12-12 DIAGNOSIS — K047 Periapical abscess without sinus: Secondary | ICD-10-CM | POA: Insufficient documentation

## 2021-12-12 DIAGNOSIS — F29 Unspecified psychosis not due to a substance or known physiological condition: Secondary | ICD-10-CM | POA: Diagnosis not present

## 2021-12-12 DIAGNOSIS — K0889 Other specified disorders of teeth and supporting structures: Secondary | ICD-10-CM

## 2021-12-12 DIAGNOSIS — R000 Tachycardia, unspecified: Secondary | ICD-10-CM | POA: Diagnosis not present

## 2021-12-12 LAB — TROPONIN I (HIGH SENSITIVITY): Troponin I (High Sensitivity): 8 ng/L (ref <18)

## 2021-12-12 LAB — BASIC METABOLIC PANEL
Anion gap: 13 (ref 5–15)
BUN: 7 mg/dL (ref 6–20)
CO2: 18 mmol/L — ABNORMAL LOW (ref 22–32)
Calcium: 9.4 mg/dL (ref 8.9–10.3)
Chloride: 108 mmol/L (ref 98–111)
Creatinine, Ser: 0.75 mg/dL (ref 0.44–1.00)
GFR, Estimated: >60 mL/min (ref >60)
Glucose, Bld: 106 mg/dL — ABNORMAL HIGH (ref 70–99)
Potassium: 3.8 mmol/L (ref 3.5–5.1)
Sodium: 139 mmol/L (ref 135–145)

## 2021-12-12 LAB — CBC
HCT: 39.1 % (ref 36.0–46.0)
Hemoglobin: 12.5 g/dL (ref 12.0–15.0)
MCH: 25.8 pg — ABNORMAL LOW (ref 26.0–34.0)
MCHC: 32 g/dL (ref 30.0–36.0)
MCV: 80.8 fL (ref 80.0–100.0)
Platelets: 325 10*3/uL (ref 150–400)
RBC: 4.84 MIL/uL (ref 3.87–5.11)
RDW: 13.7 % (ref 11.5–15.5)
WBC: 11.7 10*3/uL — ABNORMAL HIGH (ref 4.0–10.5)
nRBC: 0 % (ref 0.0–0.2)

## 2021-12-12 LAB — I-STAT BETA HCG BLOOD, ED (MC, WL, AP ONLY): I-stat hCG, quantitative: <5 m[IU]/mL (ref <5)

## 2021-12-12 NOTE — ED Triage Notes (Signed)
Patient complains of "crushing chest pain" raddiating to left neck and arm.  Patient reports seh took her night time home meds.  Patient has extensive psych hx.  Patient has been here 52.  Everytime when asked if she is SI HI she reports yes.

## 2021-12-12 NOTE — ED Triage Notes (Signed)
Patient stated she is not suicidal today .

## 2021-12-12 NOTE — ED Provider Triage Note (Signed)
Emergency Medicine Provider Triage Evaluation Note  [name] , a 31 y.o. female  was evaluated in triage.  Pt complains of chest pain.  Started at 3 PM today.  It is constant, worse when she "moves around".  States she had "2 heart attacks previously".  Not on oral birth control..  Review of Systems  Per HPI  Physical Exam  BP 124/63 (BP Location: Left Arm)   Pulse (!) 113   Temp 99.3 F (37.4 C) (Oral)   Resp 16   LMP 11/15/2021 (Approximate)   SpO2 97%  Gen:   Awake, no distress   Resp:  Normal effort  MSK:   Moves extremities without difficulty Other:  Mildly tachycardic with regular rhythm  Medical Decision Making  Medically screening exam initiated at 7:10 PM.  Appropriate orders placed.  [name] was informed that the remainder of the evaluation will be completed by another provider, this initial triage assessment does not replace that evaluation, and the importance of remaining in the ED until their evaluation is complete.     Sherrill Raring, PA-C 12/12/21 1911

## 2021-12-13 LAB — TROPONIN I (HIGH SENSITIVITY): Troponin I (High Sensitivity): 4 ng/L (ref <18)

## 2021-12-13 MED ORDER — QUETIAPINE FUMARATE 25 MG PO TABS: 12.5000 mg | ORAL_TABLET | Freq: Once | ORAL | Status: DC

## 2021-12-13 MED ORDER — ONDANSETRON 4 MG PO TBDP: 4.0000 mg | ORAL_TABLET | Freq: Three times a day (TID) | ORAL | 0 refills | Status: DC | PRN

## 2021-12-13 MED ORDER — LORAZEPAM 1 MG PO TABS: 1.0000 mg | ORAL_TABLET | Freq: Once | ORAL | Status: DC

## 2021-12-13 MED ORDER — PENICILLIN V POTASSIUM 500 MG PO TABS: 500.0000 mg | ORAL_TABLET | Freq: Four times a day (QID) | ORAL | 0 refills | Status: AC

## 2021-12-13 NOTE — ED Provider Notes (Signed)
[facility] EMERGENCY DEPARTMENT Provider Note   CSN: [id number] Arrival date & time: 12/12/21  1853     History  Chief Complaint  Patient presents with   Chest Pain    [name] is a 31 y.o. female.  The history is provided by the patient and medical records. No language interpreter was used.  Chest Pain Pain location:  Substernal area Pain quality: aching   Pain radiates to:  R jaw (from R jaw to chest) Pain severity:  Moderate Duration:  1 week (years of pain but worse in last week) Timing:  Constant Progression:  Unchanged Chronicity:  Recurrent Relieved by:  Nothing Worsened by:  Nothing Ineffective treatments:  None tried Associated symptoms: headache (r dental pain) and nausea   Associated symptoms: no abdominal pain, no altered mental status, no back pain, no cough, no diaphoresis, no fatigue, no fever, no heartburn, no near-syncope, no numbness, no palpitations, no shortness of breath, no vomiting and no weakness        Home Medications Prior to Admission medications   Medication Sig Start Date End Date Taking? Authorizing Provider  albuterol (VENTOLIN HFA) 108 (90 Base) MCG/ACT inhaler Inhale 2 puffs into the lungs every 6 (six) hours as needed for wheezing or shortness of breath.    [provider]  budesonide-formoterol (SYMBICORT) 160-4.5 MCG/ACT inhaler Inhale 2 puffs into the lungs 2 (two) times daily.    [provider]  calcium carbonate (TUMS EX) 750 MG chewable tablet Chew 1,500 mg by mouth 2 (two) times daily as needed for heartburn.    [provider]  cariprazine (VRAYLAR) 1.5 MG capsule Take 1 capsule (1.5 mg total) by mouth daily. 12/12/21   Rankin, Shuvon B, NP  cyclobenzaprine (FLEXERIL) 10 MG tablet Take 10 mg by mouth at bedtime.    [provider]  diclofenac Sodium (VOLTAREN) 1 % GEL Apply 2 g topically 4 (four) times daily. 06/23/21   Harris, Abigail, PA-C  escitalopram  (LEXAPRO) 5 MG tablet Take 3 tablets (15 mg total) by mouth daily. 09/05/21   Merrily Brittle, DO  ferrous sulfate 325 (65 FE) MG tablet Take 1 tablet (325 mg total) by mouth every other day. 10/11/21 12/11/21  France Ravens, MD  hydrOXYzine (ATARAX) 25 MG tablet Take 25 mg by mouth 3 (three) times daily as needed for anxiety.    [provider]  Lidocaine-Menthol (ICY HOT MAX LIDOCAINE EX) Apply 1 Application topically daily as needed (pain).    [provider]  Multiple Vitamins-Minerals (EMERGEN-C IMMUNE PO) Take 1 packet by mouth daily as needed (Immune support).    [provider]  neomycin-bacitracin-polymyxin (NEOSPORIN) OINT Apply 1 Application topically 2 (two) times daily. 10/23/21   Massengill, Ovid Curd, MD  pantoprazole (PROTONIX) 40 MG tablet TAKE 1 TABLET BY MOUTH DAILY Patient taking differently: 40 mg daily. 04/26/21   Jeanie Sewer, NP  prazosin (MINIPRESS) 1 MG capsule Take 1 capsule (1 mg total) by mouth at bedtime for 7 days. 07/28/21 12/11/21  Massengill, Ovid Curd, MD  pregabalin (LYRICA) 50 MG capsule Take 1 capsule (50 mg total) by mouth every 8 (eight) hours for 14 days. 10/23/21 12/11/21  Massengill, Ovid Curd, MD  propranolol (INDERAL) 10 MG tablet Take 1 tablet (10 mg total) by mouth 2 (two) times daily. 11/01/21   Blanchie Dessert, MD  QUEtiapine (SEROQUEL) 25 MG tablet Take 0.5 tablets (12.5 mg total) by mouth 2 (two) times daily. 12/11/21   Rankin, Mercy Moore, NP  Allergies    Bee venom, Coconut flavor, Fish allergy, Geodon [ziprasidone hcl], Haloperidol and related, Lithobid [lithium], Roxicodone [oxycodone], Seroquel [quetiapine], Shellfish allergy, Phenergan [promethazine hcl], Prilosec [omeprazole], Sulfa antibiotics, Tegretol [carbamazepine], Prozac [fluoxetine], Tape, and Tylenol [acetaminophen]    Review of Systems   Review of Systems  Constitutional:  Negative for chills, diaphoresis, fatigue and fever.  HENT:  Positive for dental problem.    Respiratory:  Negative for cough, chest tightness, shortness of breath and wheezing.   Cardiovascular:  Positive for chest pain. Negative for palpitations and near-syncope.  Gastrointestinal:  Positive for nausea. Negative for abdominal pain, constipation, diarrhea, heartburn and vomiting.  Genitourinary:  Negative for dysuria.  Musculoskeletal:  Negative for back pain, neck pain and neck stiffness.  Skin:  Negative for rash and wound.  Neurological:  Positive for headaches (r dental pain). Negative for seizures, weakness, light-headedness and numbness.  Psychiatric/Behavioral:  Negative for agitation and confusion.   All other systems reviewed and are negative.   Physical Exam Updated Vital Signs BP (!) 140/82 (BP Location: Left Arm)   Pulse (!) 107   Temp 98.1 F (36.7 C) (Oral)   Resp 20   LMP 11/15/2021 (Approximate)   SpO2 100%  Physical Exam Vitals and nursing note reviewed.  Constitutional:      General: She is not in acute distress.    Appearance: She is well-developed. She is not ill-appearing, toxic-appearing or diaphoretic.  HENT:     Head: Atraumatic.     Nose: Nose normal. No congestion or rhinorrhea.     Mouth/Throat:     Mouth: Mucous membranes are moist.     Dentition: Does not have dentures. Dental tenderness and dental caries present.     Tongue: Tongue does not deviate from midline.     Palate: No mass.     Pharynx: No pharyngeal swelling, oropharyngeal exudate, posterior oropharyngeal erythema or uvula swelling.     Tonsils: No tonsillar exudate or tonsillar abscesses.      Comments: Tenderness in right posterior upper posterior teeth where she has had problems in the past.  No surrounding swelling or definitive erythema amenable to drainage.  No evidence of PTA or RPA.  No stridor.  No swelling under neck.  Uvula midline. Eyes:     Extraocular Movements: Extraocular movements intact.     Conjunctiva/sclera: Conjunctivae normal.     Pupils: Pupils are  equal, round, and reactive to light.  Cardiovascular:     Rate and Rhythm: Normal rate and regular rhythm.     Heart sounds: Normal heart sounds. No murmur heard. Pulmonary:     Effort: Pulmonary effort is normal. No respiratory distress.     Breath sounds: Normal breath sounds. No decreased breath sounds, wheezing, rhonchi or rales.  Abdominal:     General: Abdomen is flat.     Palpations: Abdomen is soft.     Tenderness: There is no abdominal tenderness. There is no right CVA tenderness, left CVA tenderness, guarding or rebound.  Musculoskeletal:        General: No swelling.     Cervical back: Neck supple.     Right lower leg: No edema.     Left lower leg: No edema.  Skin:    General: Skin is warm and dry.     Capillary Refill: Capillary refill takes less than 2 seconds.     Coloration: Skin is not pale.     Findings: No erythema or rash.  Neurological:  General: No focal deficit present.     Mental Status: She is alert. Mental status is at baseline.  Psychiatric:        Mood and Affect: Mood normal.     ED Results / Procedures / Treatments   Labs (all labs ordered are listed, but only abnormal results are displayed) Labs Reviewed  BASIC METABOLIC PANEL - Abnormal; Notable for the following components:      Result Value   CO2 18 (*)    Glucose, Bld 106 (*)    All other components within normal limits  CBC - Abnormal; Notable for the following components:   WBC 11.7 (*)    MCH 25.8 (*)    All other components within normal limits  I-STAT BETA HCG BLOOD, ED ([facility], [facility], [facility] ONLY)  TROPONIN I (HIGH SENSITIVITY)  TROPONIN I (HIGH SENSITIVITY)    EKG None  Radiology DG Chest 2 View  Result Date: 12/12/2021 CLINICAL DATA:  Chest pain EXAM: CHEST - 2 VIEW COMPARISON:  Chest x-ray dated November 29, 2021 FINDINGS: Cardiac and mediastinal contours are within normal limits. Low lung volumes. Lungs are clear. No effusion or pneumothorax. IMPRESSION: No active cardiopulmonary  disease. Electronically Signed   By: Yetta Glassman M.D.   On: 12/12/2021 19:57    Procedures Procedures    Medications Ordered in ED Medications - No data to display  ED Course/ Medical Decision Making/ A&P                           Medical Decision Making   Haleigh Desmith is a 31 y.o. female with a past medical history significant for schizoaffective disorder, seizures, personality sorter, anxiety, depression, seizures, asthma, fibromyalgia, and reported chronic dental pain who presents with right dental pain worsening leading to pain in her right head and chest.  According to patient, for the last 4 years she has had right dental pain and has not seen a dentist.  She says that over the last week it has worsened and she think she has a dental infection.  She says that the pain is gone from her right jaw down into her chest and this is causing her chest pain she believes.  She does not have any fevers or chills reported but wanted to make sure she did not have a serious infection.  She denies any trauma.  She still is able to eat and drink.  She denies any current shortness of breath, palpitations, vomiting, or other complaints.  She reports nausea with the pain.  Of note, patient has been in the waiting room for over 21 hours and 20 minutes prior to my initial evaluation.  EKG from yesterday shows similar appearance to prior with no STEMI.  On my exam, patient does have some tenderness on dental exam on her right posterior upper teeth where she has the trouble.  There is no bulging, swelling, or significant erythema however clinically suspect she may have a dental infection that is worsened.  No abscess amenable to drainage at this time.  No evidence of PTA, RPA, and there is no stridor.  Oropharyngeal exam otherwise unremarkable.  Lungs clear.  Chest nontender.  Abdomen nontender.  Back nontender.  Good pulses in extremities.  Patient resting comfortably.  Patient will be given  prescription for antibiotics for the suspected dental infection and given resources for low-cost dental care.  She was encouraged to follow-up with them.  She was also given  prescription for Zofran so she can maintain hydration as she reports this is helped her in the past.  In regards to the chest discomfort, she thinks is coming from her dental pain and given the location and description of symptoms I agree.  She reports she has a cardiologist that she sees and she will see them in follow-up.  Her troponin was negative x2 overnight and her chest x-ray was also reassuring.  She was slightly tachycardic but I think it was related to some of the dental pain.  I also suspect the mild leukocytosis is related to the dental infection.  She does not appear septic at this time as she is afebrile and heart rate during my evaluation was less than 100.  She is not tachypneic either.  She is not hypotensive.  Patient well-appearing.  Patient on her well appearance do not feel she needs CT imaging at this time and I also do not feel she has other concerning etiology of her chest pain that she reports has had for 4 years.  Patient is amenable to this plan.  She understands return precautions and follow-up instructions and was discharged in good condition.           Final Clinical Impression(s) / ED Diagnoses Final diagnoses:  Dental infection  Pain, dental  Atypical chest pain    Rx / DC Orders ED Discharge Orders          Ordered    penicillin v potassium (VEETID) 500 MG tablet  4 times daily        12/13/21 1637    ondansetron (ZOFRAN-ODT) 4 MG disintegrating tablet  Every 8 hours PRN        12/13/21 1637           Clinical Impression: 1. Dental infection   2. Pain, dental   3. Atypical chest pain     Disposition: Discharge  Condition: Good  I have discussed the results, Dx and Tx plan with the pt(& family if present). He/she/they expressed understanding and agree(s) with the  plan. Discharge instructions discussed at great length. Strict return precautions discussed and pt &/or family have verbalized understanding of the instructions. No further questions at time of discharge.    New Prescriptions   ONDANSETRON (ZOFRAN-ODT) 4 MG DISINTEGRATING TABLET    Take 1 tablet (4 mg total) by mouth every 8 (eight) hours as needed for nausea or vomiting.   PENICILLIN V POTASSIUM (VEETID) 500 MG TABLET    Take 1 tablet (500 mg total) by mouth 4 (four) times daily for 7 days.    Follow Up: your cardiologist, PCP, and dentistry     [facility], [facility] [place] [address] [place] [address] [phone] mc [place]       Selmer Adduci, Gwenyth Allegra, MD 12/13/21 [phone]

## 2021-12-13 NOTE — ED Notes (Signed)
Patient threw herself on the floor. Other patients alerted this writer to her lying face down on the floor. Patient with tremors not moving. This Probation officer asked patient to get up because she put herself on the floor. Patient now sitting in the chair with hands on head rocking back and forth. Charge made aware

## 2021-12-13 NOTE — ED Notes (Signed)
Discharge instructions reviewed with patient. Patient denies any questions at this time. Patient ambulatory out to lobby to wait for taxi.

## 2021-12-13 NOTE — Discharge Instructions (Addendum)
Your history, exam, work-up today are consistent with a likely dental infection causing the jaw pain radiating causing your other discomfort.  Your work-up showed unchanged EKG and your heart enzymes were negative.  Your chest x-ray was reassuring and I suspect your slightly elevated white blood cell count is related to the dental infection.  Please take the antibiotic to treat and use the nausea medicine to help with hydration.  Please call to follow-up with a dentist and follow-up with your PCP and cardiology teams.  Please rest and stay hydrated.  If any symptoms change or worsen acutely, please return to the emergency department.

## 2021-12-18 DIAGNOSIS — Z720 Tobacco use: Secondary | ICD-10-CM | POA: Diagnosis not present

## 2021-12-18 DIAGNOSIS — Z86718 Personal history of other venous thrombosis and embolism: Secondary | ICD-10-CM | POA: Diagnosis not present

## 2021-12-18 DIAGNOSIS — R0602 Shortness of breath: Secondary | ICD-10-CM | POA: Diagnosis not present

## 2021-12-18 DIAGNOSIS — F17210 Nicotine dependence, cigarettes, uncomplicated: Secondary | ICD-10-CM | POA: Diagnosis not present

## 2021-12-18 DIAGNOSIS — R600 Localized edema: Secondary | ICD-10-CM | POA: Diagnosis not present

## 2021-12-18 DIAGNOSIS — Z6834 Body mass index (BMI) 34.0-34.9, adult: Secondary | ICD-10-CM | POA: Diagnosis not present

## 2021-12-18 DIAGNOSIS — R000 Tachycardia, unspecified: Secondary | ICD-10-CM | POA: Diagnosis not present

## 2021-12-22 DIAGNOSIS — T68XXXA Hypothermia, initial encounter: Secondary | ICD-10-CM | POA: Diagnosis not present

## 2021-12-22 DIAGNOSIS — R0789 Other chest pain: Secondary | ICD-10-CM | POA: Diagnosis not present

## 2021-12-22 DIAGNOSIS — R079 Chest pain, unspecified: Secondary | ICD-10-CM | POA: Diagnosis not present

## 2021-12-22 DIAGNOSIS — I959 Hypotension, unspecified: Secondary | ICD-10-CM | POA: Diagnosis not present

## 2021-12-22 DIAGNOSIS — R000 Tachycardia, unspecified: Secondary | ICD-10-CM | POA: Diagnosis not present

## 2021-12-27 ENCOUNTER — Inpatient Hospital Stay (HOSPITAL_COMMUNITY)
Admission: EM | Admit: 2021-12-27 | Discharge: 2022-01-01 | DRG: 917 | Disposition: A | Discharge status: Other Acute Inpatient Hospital | Payer: PPO | Attending: Internal Medicine | Admitting: Internal Medicine

## 2021-12-27 ENCOUNTER — Other Ambulatory Visit: Payer: Self-pay

## 2021-12-27 ENCOUNTER — Encounter (HOSPITAL_COMMUNITY): Payer: Self-pay

## 2021-12-27 DIAGNOSIS — Z87891 Personal history of nicotine dependence: Secondary | ICD-10-CM

## 2021-12-27 DIAGNOSIS — Z882 Allergy status to sulfonamides status: Secondary | ICD-10-CM | POA: Diagnosis not present

## 2021-12-27 DIAGNOSIS — T481X2A Poisoning by skeletal muscle relaxants [neuromuscular blocking agents], intentional self-harm, initial encounter: Secondary | ICD-10-CM | POA: Diagnosis not present

## 2021-12-27 DIAGNOSIS — Z91018 Allergy to other foods: Secondary | ICD-10-CM | POA: Diagnosis not present

## 2021-12-27 DIAGNOSIS — R079 Chest pain, unspecified: Secondary | ICD-10-CM | POA: Diagnosis not present

## 2021-12-27 DIAGNOSIS — F845 Asperger's syndrome: Secondary | ICD-10-CM | POA: Diagnosis not present

## 2021-12-27 DIAGNOSIS — Z91030 Bee allergy status: Secondary | ICD-10-CM

## 2021-12-27 DIAGNOSIS — T446X2A Poisoning by alpha-adrenoreceptor antagonists, intentional self-harm, initial encounter: Secondary | ICD-10-CM | POA: Diagnosis not present

## 2021-12-27 DIAGNOSIS — Z79899 Other long term (current) drug therapy: Secondary | ICD-10-CM | POA: Diagnosis not present

## 2021-12-27 DIAGNOSIS — Z888 Allergy status to other drugs, medicaments and biological substances status: Secondary | ICD-10-CM

## 2021-12-27 DIAGNOSIS — T43222A Poisoning by selective serotonin reuptake inhibitors, intentional self-harm, initial encounter: Secondary | ICD-10-CM | POA: Diagnosis present

## 2021-12-27 DIAGNOSIS — R0789 Other chest pain: Secondary | ICD-10-CM | POA: Diagnosis not present

## 2021-12-27 DIAGNOSIS — T43592A Poisoning by other antipsychotics and neuroleptics, intentional self-harm, initial encounter: Secondary | ICD-10-CM | POA: Diagnosis present

## 2021-12-27 DIAGNOSIS — T447X2A Poisoning by beta-adrenoreceptor antagonists, intentional self-harm, initial encounter: Principal | ICD-10-CM | POA: Diagnosis present

## 2021-12-27 DIAGNOSIS — T471X2A Poisoning by other antacids and anti-gastric-secretion drugs, intentional self-harm, initial encounter: Secondary | ICD-10-CM | POA: Diagnosis present

## 2021-12-27 DIAGNOSIS — Z1152 Encounter for screening for COVID-19: Secondary | ICD-10-CM | POA: Diagnosis not present

## 2021-12-27 DIAGNOSIS — Z91013 Allergy to seafood: Secondary | ICD-10-CM

## 2021-12-27 DIAGNOSIS — Z7951 Long term (current) use of inhaled steroids: Secondary | ICD-10-CM | POA: Diagnosis not present

## 2021-12-27 DIAGNOSIS — J45909 Unspecified asthma, uncomplicated: Secondary | ICD-10-CM | POA: Diagnosis present

## 2021-12-27 DIAGNOSIS — G928 Other toxic encephalopathy: Secondary | ICD-10-CM | POA: Diagnosis not present

## 2021-12-27 DIAGNOSIS — F4310 Post-traumatic stress disorder, unspecified: Secondary | ICD-10-CM | POA: Diagnosis present

## 2021-12-27 DIAGNOSIS — F603 Borderline personality disorder: Secondary | ICD-10-CM | POA: Diagnosis present

## 2021-12-27 DIAGNOSIS — Z148 Genetic carrier of other disease: Secondary | ICD-10-CM | POA: Diagnosis not present

## 2021-12-27 DIAGNOSIS — T481X1A Poisoning by skeletal muscle relaxants [neuromuscular blocking agents], accidental (unintentional), initial encounter: Secondary | ICD-10-CM | POA: Diagnosis not present

## 2021-12-27 DIAGNOSIS — T450X2A Poisoning by antiallergic and antiemetic drugs, intentional self-harm, initial encounter: Secondary | ICD-10-CM | POA: Diagnosis not present

## 2021-12-27 DIAGNOSIS — F250 Schizoaffective disorder, bipolar type: Secondary | ICD-10-CM | POA: Diagnosis present

## 2021-12-27 DIAGNOSIS — T68XXXA Hypothermia, initial encounter: Secondary | ICD-10-CM | POA: Diagnosis not present

## 2021-12-27 DIAGNOSIS — R002 Palpitations: Secondary | ICD-10-CM | POA: Diagnosis not present

## 2021-12-27 DIAGNOSIS — I959 Hypotension, unspecified: Secondary | ICD-10-CM | POA: Diagnosis not present

## 2021-12-27 DIAGNOSIS — T50902A Poisoning by unspecified drugs, medicaments and biological substances, intentional self-harm, initial encounter: Principal | ICD-10-CM | POA: Diagnosis present

## 2021-12-27 DIAGNOSIS — T478X1A Poisoning by other agents primarily affecting gastrointestinal system, accidental (unintentional), initial encounter: Secondary | ICD-10-CM | POA: Diagnosis not present

## 2021-12-27 DIAGNOSIS — T1491XA Suicide attempt, initial encounter: Secondary | ICD-10-CM | POA: Diagnosis not present

## 2021-12-27 DIAGNOSIS — T426X2A Poisoning by other antiepileptic and sedative-hypnotic drugs, intentional self-harm, initial encounter: Secondary | ICD-10-CM | POA: Diagnosis not present

## 2021-12-27 MED ORDER — LORAZEPAM 2 MG/ML IJ SOLN
1.0000 mg | INTRAMUSCULAR | Status: DC | PRN
  Filled 2021-12-27: qty 1

## 2021-12-27 MED ORDER — SODIUM CHLORIDE 0.9 % IV BOLUS
1000.0000 mL | Freq: Once | INTRAVENOUS | Status: AC
  Administered 2021-12-27: 1000 mL via INTRAVENOUS

## 2021-12-27 MED ORDER — SODIUM CHLORIDE 0.9 % IV BOLUS
1000.0000 mL | Freq: Once | INTRAVENOUS | Status: AC
  Administered 2021-12-28: 1000 mL via INTRAVENOUS

## 2021-12-27 MED ORDER — LORAZEPAM 1 MG PO TABS: 1.0000 mg | ORAL_TABLET | ORAL | Status: DC | PRN

## 2021-12-27 MED ORDER — LORAZEPAM 2 MG/ML IJ SOLN: 1.0000 mg | INTRAMUSCULAR | Status: DC | PRN

## 2021-12-27 NOTE — ED Triage Notes (Signed)
Patient arrived at [facility] with [facility] from home. She states she called the crisis line and they called EMS for an intentional overdose. Patient states she is having family issues. Patient states she took 6 days  of her medication about 45 minutes ago. She states she took 6 '40mg'$  Zofran pills, 6 '25mg'$  Atenolol pills, 6 '10mg'$  Cyclobenzapr pills, 18 '5mg'$  escitalopram pills, 18 '10mg'$  Hydroxyz pills, 6 '40mg'$  pantroprazole pills, 6 '1mg'$  Prazosin hcl pills, 18 '50mg'$  pregabalin pills, 3 '25mg'$  Quetiapine pills, and 6 1.'5mg'$  vraylar pills.

## 2021-12-27 NOTE — ED Provider Notes (Incomplete)
[facility] DEPART[facility] Provider Note   CSN: [id number] Arrival date & time: 12/27/21  2108     History {Add pertinent medical, surgical, social history, OB history to HPI:1} Chief Complaint  Patient presents with  . Drug Overdose    [name] is a 31 y.o. female ending to the ED due to reported intentional overdose around 8:30 pm tonight.  Patient states she called the crisis line after one of her friends left the house stating they did not want to be in contact with her anymore.  Patient states she told them she apologized for the lies, however stated they still "wanted nothing to do with me".  Patient then called crisis line stating her intention.  Stated she didn't want to live anymore and had nothing to look forward to.  Reports feelings of hopelessness.  Then stated she took 6 days worth of her regular medications.  EMS was called.  Patient reports having taken 6x '40mg'$  Zofran pills, 6x '25mg'$  Atenolol pills, 6x '10mg'$  Cyclobenzaprine pills, 18x '5mg'$  Escitalopram pills, 18x '10mg'$  Hydroxyzine pills, 6x '40mg'$  pantroprazole pills, 6x '1mg'$  Prazosin hcl pills, 18x '50mg'$  Pregabalin pills, 3x '25mg'$  Quetiapine pills, and 6x 1.'5mg'$  Vraylar pills per nursing report.  She states she did this earlier in the year, "and it caused damage to my heart".    Hx of schizoaffective disorder, borderline personality disorder, Asperger syndrome, multiple prior suicidal ideation and attempts, schizoaffective disorder, PTSD, asthma, MDD, GAD, paranoia, bipolar.  The history is provided by the patient and medical records.  Drug Overdose     Home Medications Prior to Admission medications   Medication Sig Start Date End Date Taking? Authorizing Provider  albuterol (VENTOLIN HFA) 108 (90 Base) MCG/ACT inhaler Inhale 2 puffs into the lungs every 6 (six) hours as needed for wheezing or shortness of breath.    [provider]  budesonide-formoterol (SYMBICORT) 160-4.5 MCG/ACT  inhaler Inhale 2 puffs into the lungs 2 (two) times daily.    [provider]  calcium carbonate (TUMS EX) 750 MG chewable tablet Chew 1,500 mg by mouth 2 (two) times daily as needed for heartburn.    [provider]  cariprazine (VRAYLAR) 1.5 MG capsule Take 1 capsule (1.5 mg total) by mouth daily. 12/12/21   Rankin, Shuvon B, NP  cyclobenzaprine (FLEXERIL) 10 MG tablet Take 10 mg by mouth at bedtime.    [provider]  diclofenac Sodium (VOLTAREN) 1 % GEL Apply 2 g topically 4 (four) times daily. 06/23/21   Harris, Abigail, PA-C  escitalopram (LEXAPRO) 5 MG tablet Take 3 tablets (15 mg total) by mouth daily. 09/05/21   Merrily Brittle, DO  ferrous sulfate 325 (65 FE) MG tablet Take 1 tablet (325 mg total) by mouth every other day. 10/11/21 12/11/21  France Ravens, MD  hydrOXYzine (ATARAX) 25 MG tablet Take 25 mg by mouth 3 (three) times daily as needed for anxiety.    [provider]  Lidocaine-Menthol (ICY HOT MAX LIDOCAINE EX) Apply 1 Application topically daily as needed (pain).    [provider]  Multiple Vitamins-Minerals (EMERGEN-C IMMUNE PO) Take 1 packet by mouth daily as needed (Immune support).    [provider]  neomycin-bacitracin-polymyxin (NEOSPORIN) OINT Apply 1 Application topically 2 (two) times daily. 10/23/21   Massengill, Ovid Curd, MD  ondansetron (ZOFRAN-ODT) 4 MG disintegrating tablet Take 1 tablet (4 mg total) by mouth every 8 (eight) hours as needed for nausea or vomiting. 12/13/21   Tegeler, Gwenyth Allegra, MD  pantoprazole (PROTONIX) 40 MG tablet TAKE 1 TABLET BY MOUTH DAILY Patient taking differently: 40 mg daily. 04/26/21   Jeanie Sewer, NP  prazosin (MINIPRESS) 1 MG capsule Take 1 capsule (1 mg total) by mouth at bedtime for 7 days. 07/28/21 12/11/21  Massengill, Ovid Curd, MD  pregabalin (LYRICA) 50 MG capsule Take 1 capsule (50 mg total) by mouth every 8 (eight) hours for 14 days. 10/23/21 12/11/21  Massengill, Ovid Curd, MD   propranolol (INDERAL) 10 MG tablet Take 1 tablet (10 mg total) by mouth 2 (two) times daily. 11/01/21   Blanchie Dessert, MD  QUEtiapine (SEROQUEL) 25 MG tablet Take 0.5 tablets (12.5 mg total) by mouth 2 (two) times daily. 12/11/21   Rankin, Shuvon B, NP      Allergies    Bee venom, Coconut flavor, Fish allergy, Geodon [ziprasidone hcl], Haloperidol and related, Lithobid [lithium], Roxicodone [oxycodone], Seroquel [quetiapine], Shellfish allergy, Phenergan [promethazine hcl], Prilosec [omeprazole], Sulfa antibiotics, Tegretol [carbamazepine], Prozac [fluoxetine], Tape, and Tylenol [acetaminophen]    Review of Systems   Review of Systems  Psychiatric/Behavioral:  Positive for hallucinations, self-injury and suicidal ideas.     Physical Exam Updated Vital Signs BP (!) 75/40   Pulse 72   Temp 97.7 F (36.5 C)   Resp 15   Ht '5\' 4"'$  (1.626 m)   Wt 92.1 kg   LMP 11/15/2021 (Approximate)   SpO2 (!) 89%   BMI 34.84 kg/m  Physical Exam Vitals and nursing note reviewed.  Constitutional:      General: She is not in acute distress.    Appearance: She is well-developed. She is not ill-appearing, toxic-appearing or diaphoretic.  HENT:     Head: Normocephalic and atraumatic.     Mouth/Throat:     Mouth: Mucous membranes are moist.     Pharynx: Oropharynx is clear.  Eyes:     General: No scleral icterus.    Conjunctiva/sclera: Conjunctivae normal.  Cardiovascular:     Rate and Rhythm: Normal rate and regular rhythm.     Heart sounds: No murmur heard. Pulmonary:     Effort: Pulmonary effort is normal. No respiratory distress.     Breath sounds: Normal breath sounds.     Comments: CTAB, communicating well without difficulty, without increased respiratory effort Chest:     Chest wall: No tenderness.  Abdominal:     General: Bowel sounds are normal. There is no distension.     Palpations: Abdomen is soft.     Tenderness: There is no abdominal tenderness. There is no right CVA  tenderness, left CVA tenderness or guarding. Negative signs include Murphy's sign and McBurney's sign.  Musculoskeletal:        General: No swelling or tenderness.     Cervical back: Neck supple. No rigidity.  Skin:    General: Skin is warm and dry.     Capillary Refill: Capillary refill takes less than 2 seconds.     Coloration: Skin is not jaundiced or pale.     Findings: No erythema.  Neurological:     Mental Status: She is alert and oriented to person, place, and time.     Comments: Mild occasional muscle fasciculations/spasms appreciated.  Psychiatric:        Attention and Perception: She perceives auditory hallucinations. She does not perceive visual hallucinations.        Mood and Affect: Mood is depressed. Affect is flat.        Speech: Speech is not tangential.  Behavior: Behavior is not agitated, slowed, aggressive or combative. Behavior is cooperative.        Thought Content: Thought content includes suicidal ideation. Thought content does not include homicidal ideation. Thought content includes suicidal plan. Thought content does not include homicidal plan.   ED Results / Procedures / Treatments   Labs (all labs ordered are listed, but only abnormal results are displayed) Labs Reviewed  RESP PANEL BY RT-PCR (FLU A&B, COVID) [id number]  COMPREHENSIVE METABOLIC PANEL  CBC WITH DIFFERENTIAL/PLATELET  URINALYSIS, ROUTINE W REFLEX MICROSCOPIC  I-STAT CHEM 8, ED  I-STAT BETA HCG BLOOD, ED ([facility], [facility], [facility] ONLY)  I-STAT BETA HCG BLOOD, ED ([facility], [facility], [facility] ONLY)    EKG EKG Interpretation  Date/Time:  Wednesday December 27 2021 21:14:09 EST Ventricular Rate:  80 PR Interval:  136 QRS Duration: 86 QT Interval:  378 QTC Calculation: 436 R Axis:   61 Text Interpretation: Sinus rhythm when compared to prior, prevoius t wave inversion in lead 3 is upright. No STEMI Confirmed by Antony Blackbird [phone]) on 12/27/2021 9:17:25 PM  Radiology No results found.  Procedures Procedures   {Document cardiac monitor, telemetry assessment procedure when appropriate:1}  Medications Ordered in ED Medications  LORazepam (ATIVAN) injection 1 mg (has no administration in time range)  sodium chloride 0.9 % bolus 1,000 mL (has no administration in time range)  sodium chloride 0.9 % bolus 1,000 mL (has no administration in time range)  sodium chloride 0.9 % bolus 1,000 mL (1,000 mLs Intravenous New Bag/Given 12/27/21 2249)    ED Course/ Medical Decision Making/ A&P Clinical Course as of 12/28/21 0003  Wed Dec 27, 2021  2205 Consulted with Patty with Poison Control.  Atenolol concern for hypotension and bradycardia.  Hydroxyzine and Prazosin and quetiapine can cause prolonged Qtc prolong and hypotension.  Seizure activity risk with hydroxyzine, escitalopram, and pregabalin.    If hypotensive, give fluids and Norepi.  Low concern for significant bradycardia, several of the medications may cause tachycardia which may equal out.  If Qtc prolongation >500, replace magnesium to at least 2 and potassium to at least 4.  If seizing or increased agitation, utilize benzos.  CNS and respiratory depression risk with most of these meds.   Also recommend tylenol and electrolyte level at 12:30am (around 4 hrs or later).  Recommend minimum 8 hour observation with exception of escitalopram.  If still symptomatic, then keep.  If better, then can DC.  Does not recommend admission at this time unless becomes more symptomatic.   [AC]  2345 BP(!): 76/44 [AC]    Clinical Course User Index [AC] Prince Rome, PA-C                           Medical Decision Making  31 y.o. female presents to the ED for concern of Drug Overdose     This involves an extensive number of treatment options, and is a complaint that carries with it a high risk of complications and morbidity.     Past Medical History / Co-morbidities / Social History: Hx of schizoaffective disorder, borderline personality disorder,  Asperger syndrome, multiple prior suicidal ideation and attempts, schizoaffective disorder, PTSD, asthma, MDD, GAD, paranoia, bipolar. Social Determinants of Health include: behavioral health conditions, suicidal ideation  Additional History:  Obtained by chart review.  Notably previous ED visits, see for details.  Lab Tests: I ordered, and personally interpreted labs.  The pertinent results include:  Imaging Studies: None  Cardiac Monitoring: The patient was maintained on a cardiac monitor.  I personally viewed and interpreted the cardiac monitored which showed an underlying rhythm of: NSR  ED Course / Critical Interventions: Pt overall well-appearing on exam.  Patient laying comfortably.  AAOx4.  Nontoxic, nonseptic appearing.  Communicating well without difficulty.  CTAB.  Abdomen soft, nontender.  Mild occasional muscle twitching/spasms.  PERRLA.  Normal bowel sounds.  Remaining exam unremarkable.    Upon reevaluation, pt has become hypotensive with BP of 76/44.  Oriented to self and location.  Now groggy appearing.   I have reviewed the patients home medicines and have made adjustments as needed.  Disposition: 0000 care of [name] transferred to oncoming team at the end of my shift as the patient will require reassessment once labs/imaging have resulted.  Patient presentation, ED course, and plan of care discussed with review of all pertinent labs and imaging.  Please see his/her note for further details regarding further ED course and disposition.   Pt is under IVC.  Plan at time of handoff is to draw initial labs between 0000-0030 and continue close monitoring for minimum of 8 hours since ingestion.  See further recommendations from poison control above.  May contact them as needed for acute changes, and may consider admission if worsening of condition.  Once pt medically cleared, plan to proceed with psychiatry.  This may be altered or completely changed at the  discretion of the oncoming team pending results of further workup.    I discussed this case with my attending, Dr. Sherry Ruffing, who agreed with the proposed treatment course and cosigned this note including patient's presenting symptoms, physical exam, and planned diagnostics and interventions.  Attending physician stated agreement with plan or made changes to plan which were implemented.     This chart was dictated using voice recognition software.  Despite best efforts to proofread, errors can occur which can change the documentation meaning.   {Document critical care time when appropriate:1} {Document review of labs and clinical decision tools ie heart score, Chads2Vasc2 etc:1}  {Document your independent review of radiology images, and any outside records:1} {Document your discussion with family members, caretakers, and with consultants:1} {Document social determinants of health affecting pt's care:1} {Document your decision making why or why not admission, treatments were needed:1} Final Clinical Impression(s) / ED Diagnoses Final diagnoses:  None    Rx / DC Orders ED Discharge Orders     None

## 2021-12-27 NOTE — ED Provider Notes (Addendum)
[facility] EMERGENCY DEPARTMENT Provider Note   CSN: [id number] Arrival date & time: 12/27/21  2108     History  Chief Complaint  Patient presents with   Drug Overdose    [name] is a 31 y.o. female ending to the ED due to reported intentional overdose around 8:30 pm tonight.  Patient states she called the crisis line after one of her friends left the house stating they did not want to be in contact with her anymore.  Patient states she told them she apologized for the lies, however stated they still "wanted nothing to do with me".  Patient then called crisis line stating her intention.  Stated she didn't want to live anymore and had nothing to look forward to.  Reports feelings of hopelessness.  Then stated she took 6 days worth of her regular medications.  EMS was called.  Patient reports having taken 6x '40mg'$  Zofran pills, 6x '25mg'$  Atenolol pills, 6x '10mg'$  Cyclobenzaprine pills, 18x '5mg'$  Escitalopram pills, 18x '10mg'$  Hydroxyzine pills, 6x '40mg'$  pantroprazole pills, 6x '1mg'$  Prazosin hcl pills, 18x '50mg'$  Pregabalin pills, 3x '25mg'$  Quetiapine pills, and 6x 1.'5mg'$  Vraylar pills per nursing report.  She states she did this earlier in the year, "and it caused damage to my heart".    Hx of schizoaffective disorder, borderline personality disorder, Asperger syndrome, multiple prior suicidal ideation and attempts, schizoaffective disorder, PTSD, asthma, MDD, GAD, paranoia, bipolar.  The history is provided by the patient and medical records.  Drug Overdose     Home Medications Prior to Admission medications   Medication Sig Start Date End Date Taking? Authorizing Provider  albuterol (VENTOLIN HFA) 108 (90 Base) MCG/ACT inhaler Inhale 2 puffs into the lungs every 6 (six) hours as needed for wheezing or shortness of breath.    [provider]  budesonide-formoterol (SYMBICORT) 160-4.5 MCG/ACT inhaler Inhale 2 puffs into the lungs 2 (two) times daily.    [provider]  calcium carbonate (TUMS EX) 750 MG chewable tablet Chew 1,500 mg by mouth 2 (two) times daily as needed for heartburn.    [provider]  cariprazine (VRAYLAR) 1.5 MG capsule Take 1 capsule (1.5 mg total) by mouth daily. 12/12/21   Rankin, Shuvon B, NP  cyclobenzaprine (FLEXERIL) 10 MG tablet Take 10 mg by mouth at bedtime.    [provider]  diclofenac Sodium (VOLTAREN) 1 % GEL Apply 2 g topically 4 (four) times daily. 06/23/21   Harris, Abigail, PA-C  escitalopram (LEXAPRO) 5 MG tablet Take 3 tablets (15 mg total) by mouth daily. 09/05/21   Merrily Brittle, DO  ferrous sulfate 325 (65 FE) MG tablet Take 1 tablet (325 mg total) by mouth every other day. 10/11/21 12/11/21  France Ravens, MD  hydrOXYzine (ATARAX) 25 MG tablet Take 25 mg by mouth 3 (three) times daily as needed for anxiety.    [provider]  Lidocaine-Menthol (ICY HOT MAX LIDOCAINE EX) Apply 1 Application topically daily as needed (pain).    [provider]  Multiple Vitamins-Minerals (EMERGEN-C IMMUNE PO) Take 1 packet by mouth daily as needed (Immune support).    [provider]  neomycin-bacitracin-polymyxin (NEOSPORIN) OINT Apply 1 Application topically 2 (two) times daily. 10/23/21   Massengill, Ovid Curd, MD  ondansetron (ZOFRAN-ODT) 4 MG disintegrating tablet Take 1 tablet (4 mg total) by mouth every 8 (eight) hours as needed for nausea or vomiting. 12/13/21   Tegeler, Gwenyth Allegra, MD  pantoprazole (PROTONIX) 40 MG tablet TAKE 1 TABLET  BY MOUTH DAILY Patient taking differently: 40 mg daily. 04/26/21   Jeanie Sewer, NP  prazosin (MINIPRESS) 1 MG capsule Take 1 capsule (1 mg total) by mouth at bedtime for 7 days. 07/28/21 12/11/21  Massengill, Ovid Curd, MD  pregabalin (LYRICA) 50 MG capsule Take 1 capsule (50 mg total) by mouth every 8 (eight) hours for 14 days. 10/23/21 12/11/21  Massengill, Ovid Curd, MD  propranolol (INDERAL) 10 MG tablet Take 1 tablet (10 mg total) by mouth 2 (two)  times daily. 11/01/21   Blanchie Dessert, MD  QUEtiapine (SEROQUEL) 25 MG tablet Take 0.5 tablets (12.5 mg total) by mouth 2 (two) times daily. 12/11/21   Rankin, Shuvon B, NP      Allergies    Bee venom, Coconut flavor, Fish allergy, Geodon [ziprasidone hcl], Haloperidol and related, Lithobid [lithium], Roxicodone [oxycodone], Seroquel [quetiapine], Shellfish allergy, Phenergan [promethazine hcl], Prilosec [omeprazole], Sulfa antibiotics, Tegretol [carbamazepine], Prozac [fluoxetine], Tape, and Tylenol [acetaminophen]    Review of Systems   Review of Systems  Psychiatric/Behavioral:  Positive for hallucinations, self-injury and suicidal ideas.     Physical Exam Updated Vital Signs BP (!) 75/40   Pulse 72   Temp 97.7 F (36.5 C)   Resp 15   Ht '5\' 4"'$  (1.626 m)   Wt 92.1 kg   LMP 11/15/2021 (Approximate)   SpO2 (!) 89%   BMI 34.84 kg/m  Physical Exam Vitals and nursing note reviewed.  Constitutional:      General: She is not in acute distress.    Appearance: She is well-developed. She is not ill-appearing, toxic-appearing or diaphoretic.  HENT:     Head: Normocephalic and atraumatic.     Mouth/Throat:     Mouth: Mucous membranes are moist.     Pharynx: Oropharynx is clear.  Eyes:     General: Lids are normal. Gaze aligned appropriately. No scleral icterus.    Extraocular Movements: Extraocular movements intact.     Conjunctiva/sclera: Conjunctivae normal.     Pupils: Pupils are equal, round, and reactive to light.  Cardiovascular:     Rate and Rhythm: Normal rate and regular rhythm.     Heart sounds: No murmur heard. Pulmonary:     Effort: Pulmonary effort is normal. No respiratory distress.     Breath sounds: Normal breath sounds.     Comments: CTAB, communicating well without difficulty, without increased respiratory effort Chest:     Chest wall: No tenderness.  Abdominal:     General: Bowel sounds are normal. There is no distension.     Palpations: Abdomen is soft.      Tenderness: There is no abdominal tenderness. There is no right CVA tenderness, left CVA tenderness or guarding. Negative signs include Murphy's sign and McBurney's sign.  Musculoskeletal:        General: No swelling or tenderness.     Cervical back: Neck supple. No rigidity.  Skin:    General: Skin is warm and dry.     Capillary Refill: Capillary refill takes less than 2 seconds.     Coloration: Skin is not jaundiced or pale.     Findings: No erythema.  Neurological:     Mental Status: She is alert and oriented to person, place, and time.     GCS: GCS eye subscore is 4. GCS verbal subscore is 5. GCS motor subscore is 6.     Cranial Nerves: No dysarthria or facial asymmetry.     Motor: No weakness or tremor.     Coordination: Coordination normal.  Comments: Mild occasional muscle fasciculations/spasms appreciated.  Psychiatric:        Attention and Perception: She perceives auditory hallucinations. She does not perceive visual hallucinations.        Mood and Affect: Mood is depressed. Affect is flat.        Speech: Speech is not rapid and pressured, delayed, slurred or tangential.        Behavior: Behavior is not agitated, slowed, aggressive or combative. Behavior is cooperative.        Thought Content: Thought content includes suicidal ideation. Thought content does not include homicidal ideation. Thought content includes suicidal plan. Thought content does not include homicidal plan.   ED Results / Procedures / Treatments   Labs (all labs ordered are listed, but only abnormal results are displayed) Labs Reviewed  RESP PANEL BY RT-PCR (FLU A&B, COVID) ARPGX2  COMPREHENSIVE METABOLIC PANEL  CBC WITH DIFFERENTIAL/PLATELET  URINALYSIS, ROUTINE W REFLEX MICROSCOPIC  SALICYLATE LEVEL  ACETAMINOPHEN LEVEL  RAPID URINE DRUG SCREEN, HOSP PERFORMED  I-STAT CHEM 8, ED  I-STAT BETA HCG BLOOD, ED ([facility], [facility], [facility] ONLY)    EKG EKG Interpretation  Date/Time:  Wednesday December 27 2021  21:14:09 EST Ventricular Rate:  80 PR Interval:  136 QRS Duration: 86 QT Interval:  378 QTC Calculation: 436 R Axis:   61 Text Interpretation: Sinus rhythm when compared to prior, prevoius t wave inversion in lead 3 is upright. No STEMI Confirmed by Antony Blackbird [phone]) on 12/27/2021 9:17:25 PM  Radiology No results found.  Procedures .Critical Care  Performed by: Prince Rome, PA-C Authorized by: Prince Rome, PA-C   Critical care provider statement:    Critical care time (minutes):  50   Critical care was necessary to treat or prevent imminent or life-threatening deterioration of the following conditions:  Shock and toxidrome   Critical care was time spent personally by me on the following activities:  Development of treatment plan with patient or surrogate, discussions with consultants, evaluation of patient's response to treatment, examination of patient, ordering and review of laboratory studies, ordering and review of radiographic studies, ordering and performing treatments and interventions, pulse oximetry, re-evaluation of patient's condition, review of old charts and obtaining history from patient or surrogate   Care discussed with: admitting provider       Medications Ordered in ED Medications  LORazepam (ATIVAN) injection 1 mg (has no administration in time range)  sodium chloride 0.9 % bolus 1,000 mL (has no administration in time range)  sodium chloride 0.9 % bolus 1,000 mL (has no administration in time range)  sodium chloride 0.9 % bolus 1,000 mL (1,000 mLs Intravenous New Bag/Given 12/27/21 2249)    ED Course/ Medical Decision Making/ A&P Clinical Course as of 12/28/21 0223  Wed Dec 27, 2021  2215 Consulted with Patty with Poison Control.  Atenolol concern for hypotension and bradycardia.  Hydroxyzine and Prazosin and quetiapine can cause prolonged Qtc and hypotension.  Seizure activity risk with hydroxyzine, escitalopram, and pregabalin.    If  hypotensive, give fluids and Norepi.  Low concern for significant bradycardia, several of the medications may cause tachycardia which may equal out.  If Qtc prolongation >500, replace magnesium to at least 2 and potassium to at least 4.  If seizing or increased agitation, utilize benzos.  CNS and respiratory depression risk with most of these meds.   Also recommend tylenol, electrolytes, and other basic lab initial labs to be drawn at around 12:30am (around 4 hrs or later)  due to delayed onset of medication ingestion and reduced blood draws.  Clarified this several times, to which she recommended deferring lab draws until 4 hours post ingestion.  Recommend minimum 8 hour observation with exception of escitalopram.  If still symptomatic, then keep.  If better, then can DC.  Does not recommend admission at this time unless becomes more symptomatic.   [AC]  2345 BP(!): 76/44 [AC]    Clinical Course User Index [AC] Prince Rome, PA-C                           Medical Decision Making Amount and/or Complexity of Data Reviewed Labs: ordered.  Risk Prescription drug management. Decision regarding hospitalization.   31 y.o. female presents to the ED for concern of Drug Overdose     This involves an extensive number of treatment options, and is a complaint that carries with it a high risk of complications and morbidity.     Past Medical History / Co-morbidities / Social History: Hx of schizoaffective disorder, borderline personality disorder, Asperger syndrome, multiple prior suicidal ideation and attempts, schizoaffective disorder, PTSD, asthma, MDD, GAD, paranoia, bipolar. Social Determinants of Health include: behavioral health conditions, suicidal ideation  Additional History:  Obtained by chart review.  Notably previous ED visits, see for details.  Lab Tests: I ordered labs.  Pertinent findings as follows: Sodium 142, Potassium 3.8.  Creatinine 0.60.  Mild anemia Hgb 10.5 and HCT  31.0 close to baseline per recent labs I stat beta hCG negative  Imaging Studies: None  Cardiac Monitoring: The patient was maintained on a cardiac monitor.  I personally viewed and interpreted the cardiac monitored which showed an underlying rhythm of: NSR  ED Course / Critical Interventions: Presenting to intentional overdose with suicide ideation.  Pt reports hearing voices over the last two days telling her to kill herself.  Reports she decided to act on these following a negative interaction with a family friend around 8:30pm tonight.  Pt ingested a large list of medications including: 6x '40mg'$  Zofran pills, 6x '25mg'$  Atenolol pills, 6x '10mg'$  Cyclobenzaprine pills, 18x '5mg'$  Escitalopram pills, 18x '10mg'$  Hydroxyzine pills, 6x '40mg'$  pantroprazole pills, 6x '1mg'$  Prazosin hcl pills, 18x '50mg'$  Pregabalin pills, 3x '25mg'$  Quetiapine pills, and 6x 1.'5mg'$  Vraylar pills.  Pt reports wanting to die.  States she doesn't have a reason to live.  Endorses hopelessness. 2130 Vitals seen prior to evaluation, BP of 101/57 with HR of 87 and RR of 12. 2140 Pt overall well-appearing on exam.  BP 90s over 50s appreciated.  HR between 60-70 bpm.  Patient propped up on bed comfortably.  AAOx4.  Nontoxic, nonseptic appearing.  Communicating well without difficulty.  CTAB without evidence of respiratory distress or suppression.  Gaze aligned appropriately.  EOMI to provider movement.  Abdomen soft, nontender.  Mild occasional muscle twitching/spasms.  PERRLA.  Normal bowel sounds.  GCS 15.  Remaining exam unremarkable.  Neuro checks ordered.  Plan to proceed with labs. [place] with Patty from Poison control in depth.  See note above. As recommended, plan to draw initial labs close to 4 hours from ingestion though may reconsider depending on course. 2245 Upon re-evaluation of pt, still resting comfortably and communicating/interacting appropriately. BP 82/51 -- plan to continue to monitor.  Pt still receiving fluids.   2344  Upon reevaluation, pt has become increasingly hypotensive with BP of 76/44.  Oriented to self and location.  Now groggy appearing.  Bowel sounds  diminished.  Abdomen soft nontender.  Pt responds to painful and verbal stimuli.  GCS of 10.  More fluids ordered.  Labs pending.  Anticipate need for pressors and likely ICU admission. 0007 Pt re-evaluated.  BP of 90/51.  HR of 71 bpm.  Respirations of 18/min.  Does not appear cyanotic or pale.  CTAB, communicating without difficulty, without evidence of increased work of breathing or respiratory depression.  Pt alert and oriented to date of birth, name, month, event, and prior conversations upon arrival to ED.  All orientation questions answered accurately.  Pupils mildly sluggish though equally reactive to light.  Normal EOMs.  Sensation appears grossly intact of extremities.  Mild muscle spasms still appreciated.  Labs pending. 0040 Pt re-evaluated.  BP of 76/37 with new concerning MAP of 49.  BP appears to continue to fluctuate.  Still oriented to name, date of birth, event, however responses less coherent.  Pupils still appear mildly sluggish, however appear 1 mm more dilated than prior eval.  Pt resting on side.  Respiration rate of 12/min, decreased from prior.  No accessory muscle use.  Satting at 99% on room air.  Mouth appears more dry.  Bowel sounds still diminished.  Sensation appears grossly intact of extremities per pt reported response, however appears globally weaker than prior exam.   Pt is under IVC.  Due to declining vital signs and mental status since arrival with polypharmacy etiology, plan to admit to ICU.  Once pt medically cleared, plan to proceed with psychiatry per disposition of ICU team.  Consulted with Dr. Tamala Julian of [facility].  Agrees with plan for admission, plans to come evaluate the patient.  Disposition: Admission  I discussed this case with my attending, Dr. Sherry Ruffing, who agreed with the proposed treatment course and cosigned  this note.  Attending physician stated agreement with plan or made changes to plan which were implemented.    This chart was dictated using voice recognition software.  Despite best efforts to proofread, errors can occur which can change the documentation meaning.   Final Clinical Impression(s) / ED Diagnoses Final diagnoses:  Intentional overdose, initial encounter [facility])  Suicide attempt [facility])    Rx / [place] Orders ED Discharge Orders     None         Prince Rome, PA-C 21/19/41 0223     Prince Rome, PA-C 74/08/14 0246    Tegeler, Gwenyth Allegra, MD 12/28/21 [phone]

## 2021-12-28 DIAGNOSIS — Z882 Allergy status to sulfonamides status: Secondary | ICD-10-CM | POA: Diagnosis not present

## 2021-12-28 DIAGNOSIS — T471X2A Poisoning by other antacids and anti-gastric-secretion drugs, intentional self-harm, initial encounter: Secondary | ICD-10-CM | POA: Diagnosis present

## 2021-12-28 DIAGNOSIS — T446X2A Poisoning by alpha-adrenoreceptor antagonists, intentional self-harm, initial encounter: Secondary | ICD-10-CM | POA: Diagnosis present

## 2021-12-28 DIAGNOSIS — T43592A Poisoning by other antipsychotics and neuroleptics, intentional self-harm, initial encounter: Secondary | ICD-10-CM | POA: Diagnosis present

## 2021-12-28 DIAGNOSIS — Z91030 Bee allergy status: Secondary | ICD-10-CM | POA: Diagnosis not present

## 2021-12-28 DIAGNOSIS — T447X2A Poisoning by beta-adrenoreceptor antagonists, intentional self-harm, initial encounter: Secondary | ICD-10-CM | POA: Diagnosis present

## 2021-12-28 DIAGNOSIS — F845 Asperger's syndrome: Secondary | ICD-10-CM | POA: Diagnosis present

## 2021-12-28 DIAGNOSIS — F332 Major depressive disorder, recurrent severe without psychotic features: Secondary | ICD-10-CM | POA: Diagnosis not present

## 2021-12-28 DIAGNOSIS — F250 Schizoaffective disorder, bipolar type: Secondary | ICD-10-CM | POA: Diagnosis present

## 2021-12-28 DIAGNOSIS — Z1152 Encounter for screening for COVID-19: Secondary | ICD-10-CM | POA: Diagnosis not present

## 2021-12-28 DIAGNOSIS — T1491XA Suicide attempt, initial encounter: Secondary | ICD-10-CM | POA: Diagnosis present

## 2021-12-28 DIAGNOSIS — T450X2A Poisoning by antiallergic and antiemetic drugs, intentional self-harm, initial encounter: Secondary | ICD-10-CM | POA: Diagnosis present

## 2021-12-28 DIAGNOSIS — Z79899 Other long term (current) drug therapy: Secondary | ICD-10-CM | POA: Diagnosis not present

## 2021-12-28 DIAGNOSIS — Z87891 Personal history of nicotine dependence: Secondary | ICD-10-CM | POA: Diagnosis not present

## 2021-12-28 DIAGNOSIS — Z91013 Allergy to seafood: Secondary | ICD-10-CM | POA: Diagnosis not present

## 2021-12-28 DIAGNOSIS — T43222A Poisoning by selective serotonin reuptake inhibitors, intentional self-harm, initial encounter: Secondary | ICD-10-CM | POA: Diagnosis present

## 2021-12-28 DIAGNOSIS — Z888 Allergy status to other drugs, medicaments and biological substances status: Secondary | ICD-10-CM | POA: Diagnosis not present

## 2021-12-28 DIAGNOSIS — J45909 Unspecified asthma, uncomplicated: Secondary | ICD-10-CM | POA: Diagnosis present

## 2021-12-28 DIAGNOSIS — Z7951 Long term (current) use of inhaled steroids: Secondary | ICD-10-CM | POA: Diagnosis not present

## 2021-12-28 DIAGNOSIS — Z91018 Allergy to other foods: Secondary | ICD-10-CM | POA: Diagnosis not present

## 2021-12-28 DIAGNOSIS — T481X2A Poisoning by skeletal muscle relaxants [neuromuscular blocking agents], intentional self-harm, initial encounter: Secondary | ICD-10-CM | POA: Diagnosis present

## 2021-12-28 DIAGNOSIS — Z148 Genetic carrier of other disease: Secondary | ICD-10-CM | POA: Diagnosis not present

## 2021-12-28 DIAGNOSIS — G928 Other toxic encephalopathy: Secondary | ICD-10-CM | POA: Diagnosis present

## 2021-12-28 DIAGNOSIS — F603 Borderline personality disorder: Secondary | ICD-10-CM | POA: Diagnosis present

## 2021-12-28 DIAGNOSIS — F17210 Nicotine dependence, cigarettes, uncomplicated: Secondary | ICD-10-CM | POA: Diagnosis not present

## 2021-12-28 DIAGNOSIS — F4310 Post-traumatic stress disorder, unspecified: Secondary | ICD-10-CM | POA: Diagnosis present

## 2021-12-28 DIAGNOSIS — I10 Essential (primary) hypertension: Secondary | ICD-10-CM | POA: Diagnosis not present

## 2021-12-28 LAB — COMPREHENSIVE METABOLIC PANEL
ALT: 12 U/L (ref 0–44)
AST: 15 U/L (ref 15–41)
Albumin: 3.2 g/dL — ABNORMAL LOW (ref 3.5–5.0)
Alkaline Phosphatase: 56 U/L (ref 38–126)
Anion gap: 8 (ref 5–15)
BUN: <5 mg/dL — ABNORMAL LOW (ref 6–20)
CO2: 21 mmol/L — ABNORMAL LOW (ref 22–32)
Calcium: 8.1 mg/dL — ABNORMAL LOW (ref 8.9–10.3)
Chloride: 112 mmol/L — ABNORMAL HIGH (ref 98–111)
Creatinine, Ser: 0.7 mg/dL (ref 0.44–1.00)
GFR, Estimated: >60 mL/min (ref >60)
Glucose, Bld: 93 mg/dL (ref 70–99)
Potassium: 3.7 mmol/L (ref 3.5–5.1)
Sodium: 141 mmol/L (ref 135–145)
Total Bilirubin: 0.3 mg/dL (ref 0.3–1.2)
Total Protein: 5.6 g/dL — ABNORMAL LOW (ref 6.5–8.1)

## 2021-12-28 LAB — RAPID URINE DRUG SCREEN, HOSP PERFORMED
Amphetamines: NOT DETECTED
Barbiturates: NOT DETECTED
Benzodiazepines: NOT DETECTED
Cocaine: NOT DETECTED
Opiates: NOT DETECTED
Tetrahydrocannabinol: NOT DETECTED

## 2021-12-28 LAB — I-STAT CHEM 8, ED
BUN: <3 mg/dL — ABNORMAL LOW (ref 6–20)
Calcium, Ion: 1.12 mmol/L — ABNORMAL LOW (ref 1.15–1.40)
Chloride: 110 mmol/L (ref 98–111)
Creatinine, Ser: 0.6 mg/dL (ref 0.44–1.00)
Glucose, Bld: 91 mg/dL (ref 70–99)
HCT: 31 % — ABNORMAL LOW (ref 36.0–46.0)
Hemoglobin: 10.5 g/dL — ABNORMAL LOW (ref 12.0–15.0)
Potassium: 3.8 mmol/L (ref 3.5–5.1)
Sodium: 142 mmol/L (ref 135–145)
TCO2: 21 mmol/L — ABNORMAL LOW (ref 22–32)

## 2021-12-28 LAB — CBC
HCT: 36 % (ref 36.0–46.0)
Hemoglobin: 11.2 g/dL — ABNORMAL LOW (ref 12.0–15.0)
MCH: 26 pg (ref 26.0–34.0)
MCHC: 31.1 g/dL (ref 30.0–36.0)
MCV: 83.5 fL (ref 80.0–100.0)
Platelets: 262 10*3/uL (ref 150–400)
RBC: 4.31 MIL/uL (ref 3.87–5.11)
RDW: 13.6 % (ref 11.5–15.5)
WBC: 8.5 10*3/uL (ref 4.0–10.5)
nRBC: 0 % (ref 0.0–0.2)

## 2021-12-28 LAB — URINALYSIS, ROUTINE W REFLEX MICROSCOPIC
Bilirubin Urine: NEGATIVE
Glucose, UA: NEGATIVE mg/dL
Hgb urine dipstick: NEGATIVE
Ketones, ur: NEGATIVE mg/dL
Nitrite: NEGATIVE
Protein, ur: NEGATIVE mg/dL
Specific Gravity, Urine: 1.006 (ref 1.005–1.030)
pH: 8 (ref 5.0–8.0)

## 2021-12-28 LAB — BASIC METABOLIC PANEL
Anion gap: 12 (ref 5–15)
BUN: <5 mg/dL — ABNORMAL LOW (ref 6–20)
CO2: 19 mmol/L — ABNORMAL LOW (ref 22–32)
Calcium: 8.1 mg/dL — ABNORMAL LOW (ref 8.9–10.3)
Chloride: 110 mmol/L (ref 98–111)
Creatinine, Ser: 0.73 mg/dL (ref 0.44–1.00)
GFR, Estimated: >60 mL/min (ref >60)
Glucose, Bld: 103 mg/dL — ABNORMAL HIGH (ref 70–99)
Potassium: 3.9 mmol/L (ref 3.5–5.1)
Sodium: 141 mmol/L (ref 135–145)

## 2021-12-28 LAB — HEPATIC FUNCTION PANEL
ALT: 11 U/L (ref 0–44)
AST: 17 U/L (ref 15–41)
Albumin: 3.3 g/dL — ABNORMAL LOW (ref 3.5–5.0)
Alkaline Phosphatase: 57 U/L (ref 38–126)
Bilirubin, Direct: <0.1 mg/dL (ref 0.0–0.2)
Total Bilirubin: 0.3 mg/dL (ref 0.3–1.2)
Total Protein: 5.9 g/dL — ABNORMAL LOW (ref 6.5–8.1)

## 2021-12-28 LAB — PHOSPHORUS: Phosphorus: 3.1 mg/dL (ref 2.5–4.6)

## 2021-12-28 LAB — ACETAMINOPHEN LEVEL: Acetaminophen (Tylenol), Serum: <10 ug/mL — ABNORMAL LOW (ref 10–30)

## 2021-12-28 LAB — CBC WITH DIFFERENTIAL/PLATELET
Abs Immature Granulocytes: 0.02 10*3/uL (ref 0.00–0.07)
Basophils Absolute: 0 10*3/uL (ref 0.0–0.1)
Basophils Relative: 0 %
Eosinophils Absolute: 0 10*3/uL (ref 0.0–0.5)
Eosinophils Relative: 1 %
HCT: 34.7 % — ABNORMAL LOW (ref 36.0–46.0)
Hemoglobin: 10.6 g/dL — ABNORMAL LOW (ref 12.0–15.0)
Immature Granulocytes: 0 %
Lymphocytes Relative: 41 %
Lymphs Abs: 2.8 10*3/uL (ref 0.7–4.0)
MCH: 25.5 pg — ABNORMAL LOW (ref 26.0–34.0)
MCHC: 30.5 g/dL (ref 30.0–36.0)
MCV: 83.4 fL (ref 80.0–100.0)
Monocytes Absolute: 0.4 10*3/uL (ref 0.1–1.0)
Monocytes Relative: 6 %
Neutro Abs: 3.5 10*3/uL (ref 1.7–7.7)
Neutrophils Relative %: 52 %
Platelets: 142 10*3/uL — ABNORMAL LOW (ref 150–400)
RBC: 4.16 MIL/uL (ref 3.87–5.11)
RDW: 13.5 % (ref 11.5–15.5)
WBC: 6.8 10*3/uL (ref 4.0–10.5)
nRBC: 0 % (ref 0.0–0.2)

## 2021-12-28 LAB — RESP PANEL BY RT-PCR (FLU A&B, COVID) ARPGX2
Influenza A by PCR: NEGATIVE
Influenza B by PCR: NEGATIVE
SARS Coronavirus 2 by RT PCR: NEGATIVE

## 2021-12-28 LAB — MRSA NEXT GEN BY PCR, NASAL: MRSA by PCR Next Gen: NOT DETECTED

## 2021-12-28 LAB — SALICYLATE LEVEL: Salicylate Lvl: <7 mg/dL — ABNORMAL LOW (ref 7.0–30.0)

## 2021-12-28 LAB — I-STAT BETA HCG BLOOD, ED (MC, WL, AP ONLY): I-stat hCG, quantitative: <5 m[IU]/mL (ref <5)

## 2021-12-28 LAB — MAGNESIUM: Magnesium: 1.8 mg/dL (ref 1.7–2.4)

## 2021-12-28 MED ORDER — NOREPINEPHRINE 4 MG/250ML-% IV SOLN
0.0000 ug/min | INTRAVENOUS | Status: DC
  Administered 2021-12-28: 2 ug/min via INTRAVENOUS
  Filled 2021-12-28: qty 250

## 2021-12-28 MED ORDER — ORAL CARE MOUTH RINSE
15.0000 mL | OROMUCOSAL | Status: DC
  Administered 2021-12-28 – 2022-01-01 (×3): 15 mL via OROMUCOSAL

## 2021-12-28 MED ORDER — EPINEPHRINE HCL 5 MG/250ML IV SOLN IN NS
INTRAVENOUS | Status: AC
  Filled 2021-12-28: qty 250

## 2021-12-28 MED ORDER — POLYETHYLENE GLYCOL 3350 17 G PO PACK: 17.0000 g | PACK | Freq: Every day | ORAL | Status: DC | PRN

## 2021-12-28 MED ORDER — DOCUSATE SODIUM 100 MG PO CAPS: 100.0000 mg | ORAL_CAPSULE | Freq: Two times a day (BID) | ORAL | Status: DC | PRN

## 2021-12-28 MED ORDER — CHLORHEXIDINE GLUCONATE CLOTH 2 % EX PADS
6.0000 | MEDICATED_PAD | Freq: Every day | CUTANEOUS | Status: DC
  Administered 2021-12-28 – 2022-01-01 (×2): 6 via TOPICAL

## 2021-12-28 MED ORDER — PANTOPRAZOLE SODIUM 40 MG PO TBEC
40.0000 mg | DELAYED_RELEASE_TABLET | Freq: Every day | ORAL | Status: DC
  Administered 2021-12-29 – 2021-12-31 (×3): 40 mg via ORAL
  Filled 2021-12-28 (×4): qty 1

## 2021-12-28 MED ORDER — ENOXAPARIN SODIUM 40 MG/0.4ML IJ SOSY
40.0000 mg | PREFILLED_SYRINGE | INTRAMUSCULAR | Status: DC
  Filled 2021-12-28 (×3): qty 0.4

## 2021-12-28 MED ORDER — EPINEPHRINE HCL 5 MG/250ML IV SOLN IN NS
0.5000 ug/min | INTRAVENOUS | Status: DC
  Administered 2021-12-28: 0.5 ug/min via INTRAVENOUS

## 2021-12-28 MED ORDER — ORAL CARE MOUTH RINSE: 15.0000 mL | OROMUCOSAL | Status: DC | PRN

## 2021-12-28 MED ORDER — PREGABALIN 25 MG PO CAPS
50.0000 mg | ORAL_CAPSULE | Freq: Three times a day (TID) | ORAL | Status: DC | PRN
  Administered 2021-12-28 – 2021-12-29 (×3): 50 mg via ORAL
  Filled 2021-12-28 (×2): qty 1
  Filled 2021-12-28 (×2): qty 2

## 2021-12-28 MED ORDER — MAGNESIUM SULFATE 2 GM/50ML IV SOLN
2.0000 g | Freq: Once | INTRAVENOUS | Status: AC
  Administered 2021-12-28: 2 g via INTRAVENOUS
  Filled 2021-12-28: qty 50

## 2021-12-28 MED ORDER — CHLORHEXIDINE GLUCONATE 0.12 % MT SOLN
15.0000 mL | Freq: Once | OROMUCOSAL | Status: AC
  Administered 2021-12-28: 15 mL via OROMUCOSAL
  Filled 2021-12-28: qty 15
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
## 2021-12-28 NOTE — Plan of Care (Signed)

## 2021-12-28 NOTE — ED Notes (Signed)
Patient was IVC'd on 12/27/21, exp. 01/03/22.  Patient has been admitted to [facility]. Called [facility] this morning, spoke to [place].  Tubed IVC documentation to 2[facility].

## 2021-12-28 NOTE — Progress Notes (Signed)
Spoke with primary RN, PICC not needed at this time. RN will d/c order.

## 2021-12-28 NOTE — Progress Notes (Signed)
[facility] ADULT ICU REPLACEMENT PROTOCOL   The patient does apply for the [facility] Adult ICU Electrolyte Replacment Protocol based on the criteria listed below:   1.Exclusion criteria: TCTS, ECMO, Dialysis, and Myasthenia Gravis patients 2. Is GFR >/= 30 ml/min? Yes.    Patient's GFR today is >60 3. Is SCr </= 2? Yes.   Patient's SCr is 0.73 mg/dL 4. Did SCr increase >/= 0.5 in 24 hours? No. 5.Pt's weight >40kg  Yes.   6. Abnormal electrolyte(s):   Mg 1.8  7. Electrolytes replaced per protocol 8.  Call MD STAT for K+ </= 2.5, Phos </= 1, or Mag </= 1 Physician:  S. Rosie Fate R Dayja Loveridge 12/28/2021 5:34 AM 3
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
## 2021-12-28 NOTE — Progress Notes (Signed)
[facility] Progress Note Patient Name: [name] DOB: April 10, 1990 MRN: [id number]   Date of Service  12/28/2021  HPI/Events of Note  RN reports pt is now c/o heartburn.  Takes protonix at home for this  eICU Interventions  Home Protonix 40 resumed     Intervention Category Intermediate Interventions: Other:  Judd Lien 12/28/2021, 11:47 PM

## 2021-12-28 NOTE — Progress Notes (Addendum)
Patient seen, examined, reviewed H&P  In brief 31 year old female with schizoaffective disorder, severe depression who presented after suicidal attempt with multiple medications  She is alert, awake and following commands Asking to drink water  Currently on one-to-one sitter  Patient remained off vasopressors > 8 hours  Currently in sinus rhythm with stable vital signs  Psychiatric consult requested  Transfer orders were placed for medical telemetry    Jacky Kindle, MD [place] [facility] See Amion for pager If no response to pager, please call [phone] until 7pm After 7pm, Please call E-link [phone]

## 2021-12-28 NOTE — Progress Notes (Signed)
PICC order received. Spoke with primary RN, she will reach out to MD if PICC still needed at this time. Will follow up later.

## 2021-12-28 NOTE — Progress Notes (Addendum)
[facility] Progress Note Patient Name: [name] DOB: February 21, 1990 MRN: [id number]   Date of Service  12/28/2021  HPI/Events of Note  Notified patient complaining 8/10 chest pain Seen conversant and not in distress On further questioning, pain is throbbing and sharp, radiates to right arm, aggravated by movement and when she takes a deep breath. She reports she takes Lyrica for pain, confirmed on MAR. EKG without changes Admitted for intentional OD of multiple meds  eICU Interventions  Resumed Lyrica to take PRN     Intervention Category Major Interventions: Other:  Judd Lien 12/28/2021, 10:51 PM
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
## 2021-12-29 ENCOUNTER — Inpatient Hospital Stay: Payer: Self-pay

## 2021-12-29 DIAGNOSIS — T1491XA Suicide attempt, initial encounter: Secondary | ICD-10-CM | POA: Diagnosis not present

## 2021-12-29 DIAGNOSIS — F603 Borderline personality disorder: Secondary | ICD-10-CM

## 2021-12-29 MED ORDER — DICLOFENAC SODIUM 1 % EX GEL
4.0000 g | Freq: Four times a day (QID) | CUTANEOUS | Status: DC
  Administered 2021-12-29 – 2021-12-30 (×3): 4 g via TOPICAL
  Filled 2021-12-29: qty 100

## 2021-12-29 MED ORDER — ESCITALOPRAM OXALATE 10 MG PO TABS
15.0000 mg | ORAL_TABLET | Freq: Every day | ORAL | Status: DC
  Administered 2021-12-29 – 2021-12-31 (×2): 15 mg via ORAL
  Filled 2021-12-29 (×3): qty 2

## 2021-12-29 MED ORDER — CARIPRAZINE HCL 1.5 MG PO CAPS
1.5000 mg | ORAL_CAPSULE | Freq: Every day | ORAL | Status: DC
  Administered 2021-12-29 – 2022-01-01 (×3): 1.5 mg via ORAL
  Filled 2021-12-29 (×4): qty 1
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
## 2021-12-29 NOTE — Progress Notes (Signed)
Pt refused her vitals to be taken.States she doesn't want to be touched by anybody.

## 2021-12-29 NOTE — Progress Notes (Signed)
Patient refused morning lab work. States she is tired of needle sticks.
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
## 2021-12-29 NOTE — Progress Notes (Addendum)
CSW notes patient's medical stability and need for inpatient psych bed. CSW reaching out to [facility] for review. No beds available for today but will review. Per notes, patient is IVC'd (confirmed on chart 11/22).  [facility] does not have availability.   Faxed referral to [place], [place], [place] and [place].   Gilmore Laroche, MSW, [facility]

## 2021-12-29 NOTE — Discharge Summary (Addendum)
Physician Discharge Summary  [name] TFT:[id number] DOB: 04/16/1990 DOA: 12/27/2021  PCP: [name]  Admit date: 12/27/2021 Discharge date: 12/30/2021  Admitted From: home Disposition:  [facility]  Recommendations for Outpatient Follow-up:  Follow up with PCP in 1-2 weeks Please obtain BMP/CBC in one week  Home Health: none Equipment/Devices: none  Discharge Condition: stable CODE STATUS: Full code Diet Orders (From admission, onward)     Start     Ordered   12/28/21 1123  Diet regular Room service appropriate? Yes; Fluid consistency: Thin  Diet effective now       Question Answer Comment  Room service appropriate? Yes   Fluid consistency: Thin      12/28/21 1122            HPI: Per admitting MD, 31 y.o. F with PMH significant for schizoaffective disorder, personality disorder, bipolar, seizures, anxiety/depression who presented after taking 6 days of her home medications.  Reported #6 '40mg'$  Zofran pills, #6 '25mg'$  Atenolol pills, #6 '10mg'$  Cyclobenzaprine pills, #18 '5mg'$  escitalopram pills, #18 '10mg'$  Hydroxyz pills, #6 '40mg'$  pantroprazole pills, #6 '1mg'$  Prazosin hcl pills, #18 '50mg'$  pregabalin pills, #3 '25mg'$  Quetiapine pills, and #6 1.'5mg'$  vraylar pills. Per EMS run sheet, pt was upset about a friend situation and tried to intentionally harm herself then called the crisis line.   Hospital Course / Discharge diagnoses: Principal Problem:   Suicide attempt [facility]) Active Problems:   Borderline personality disorder ([place])  Suicide attempt by medication overdose-patient was initially admitted to the ICU as he required pressors.  She improved, was transferred to the hospitalist service on 11/24.  She remained stable, she is back to normal, psychiatry consulted and the recommendation inpatient psych.  She is medically cleared to go to psychiatric unit  Active problems Acute metabolic encephalopathy, bradycardia, hypotension-all resolved Bipolar/borderline  disorder, depression, anxiety-further management by inpatient psychiatry  Sepsis ruled out   Discharge Instructions   Allergies as of 12/30/2021       Reactions   Bee Venom Anaphylaxis   Coconut Flavor Anaphylaxis, Rash   Fish Allergy Anaphylaxis   Geodon [ziprasidone Hcl] Other (See Comments)   Pt states that this medication causes paralysis of the mouth.     Haloperidol And Related Other (See Comments)   Pt states that this medication causes paralysis of the mouth, jaw locks up   Lithobid [lithium] Other (See Comments)   Seizure-like activity   Roxicodone [oxycodone] Other (See Comments)   Hallucinations    Seroquel [quetiapine] Other (See Comments)   Severe drowsiness Pt currently taking 12.5 mg BID, she is ok with this dose   Shellfish Allergy Anaphylaxis   Phenergan [promethazine Hcl] Other (See Comments)   Chest pain    Prilosec [omeprazole] Nausea And Vomiting   Pt can take protonix with no problems   Sulfa Antibiotics Other (See Comments)   Chest pain    Tegretol [carbamazepine] Nausea And Vomiting   Prozac [fluoxetine] Other (See Comments)   Increased Depression Suicidal thoughts   Tape Other (See Comments)   Skin tears, can only tolerate paper tape.   Tylenol [acetaminophen] Other (See Comments)   Unknown reaction Pt states she can not take        Medication List     STOP taking these medications    propranolol 10 MG tablet Commonly known as: INDERAL       TAKE these medications    albuterol 108 (90 Base) MCG/ACT inhaler Commonly known as: VENTOLIN HFA Inhale 2  puffs into the lungs every 6 (six) hours as needed for wheezing or shortness of breath.   bismuth subsalicylate 010 MG chewable tablet Commonly known as: PEPTO BISMOL Chew 524 mg by mouth daily as needed (stomach upset).   budesonide-formoterol 160-4.5 MCG/ACT inhaler Commonly known as: SYMBICORT Inhale 2 puffs into the lungs 2 (two) times daily.   cariprazine 1.5 MG  capsule Commonly known as: VRAYLAR Take 1 capsule (1.5 mg total) by mouth daily. What changed: when to take this   cyclobenzaprine 10 MG tablet Commonly known as: FLEXERIL Take 10 mg by mouth at bedtime.   diclofenac Sodium 1 % Gel Commonly known as: Voltaren Apply 2 g topically 4 (four) times daily. What changed:  when to take this reasons to take this   EMERGEN-C IMMUNE PO Take 1 packet by mouth daily as needed (Immune support).   escitalopram 5 MG tablet Commonly known as: LEXAPRO Take 3 tablets (15 mg total) by mouth daily. What changed: when to take this   hydrOXYzine 25 MG tablet Commonly known as: ATARAX Take 25 mg by mouth 3 (three) times daily as needed for anxiety.   ICY HOT MAX LIDOCAINE EX Apply 1 Application topically daily as needed (pain).   neomycin-bacitracin-polymyxin Oint Commonly known as: NEOSPORIN Apply 1 Application topically 2 (two) times daily.   ondansetron 4 MG disintegrating tablet Commonly known as: ZOFRAN-ODT Take 1 tablet (4 mg total) by mouth every 8 (eight) hours as needed for nausea or vomiting.   pantoprazole 40 MG tablet Commonly known as: PROTONIX TAKE 1 TABLET BY MOUTH DAILY What changed:  how to take this when to take this   prazosin 1 MG capsule Commonly known as: MINIPRESS Take 1 mg by mouth at bedtime.   pregabalin 50 MG capsule Commonly known as: LYRICA Take 50 mg by mouth 3 (three) times daily.   QUEtiapine 25 MG tablet Commonly known as: SEROQUEL Take 0.5 tablets (12.5 mg total) by mouth 2 (two) times daily.          Consultations: PCCM Psychiatry  Procedures/Studies:  [name] EKG SITE RITE  Result Date: 12/28/2021 If Site Rite image not attached, placement could not be confirmed due to current cardiac rhythm.  DG Chest 2 View  Result Date: 12/12/2021 CLINICAL DATA:  Chest pain EXAM: CHEST - 2 VIEW COMPARISON:  Chest x-ray dated November 29, 2021 FINDINGS: Cardiac and mediastinal contours are within  normal limits. Low lung volumes. Lungs are clear. No effusion or pneumothorax. IMPRESSION: No active cardiopulmonary disease. Electronically Signed   By: Yetta Glassman M.D.   On: 12/12/2021 19:57     Subjective: -Doing well.  Tells me she wants to go back to work next Wednesday  Discharge Exam: BP (!) 95/51 (BP Location: Right Arm)   Pulse 90   Temp 98.6 F (37 C) (Oral)   Resp 18   Ht '5\' 4"'$  (1.626 m)   Wt 96.1 kg   SpO2 98%   BMI 36.37 kg/m   General: Pt is alert, awake, not in acute distress Cardiovascular: RRR, S1/S2 +, no rubs, no gallops Respiratory: CTA bilaterally, no wheezing, no rhonchi Abdominal: Soft, NT, ND, bowel sounds + Extremities: no edema, no cyanosis    The results of significant diagnostics from this hospitalization (including imaging, microbiology, ancillary and laboratory) are listed below for reference.     Microbiology: Recent Results (from the past 240 hour(s))  Resp Panel by RT-PCR (Flu A&B, Covid) Anterior Nasal Swab     Status: None  Collection Time: 12/28/21 12:50 AM   Specimen: Anterior Nasal Swab  Result Value Ref Range Status   SARS Coronavirus 2 by RT PCR NEGATIVE NEGATIVE Final    Comment: (NOTE) SARS-CoV-2 target nucleic acids are NOT DETECTED.  The SARS-CoV-2 RNA is generally detectable in upper respiratory specimens during the acute phase of infection. The lowest concentration of SARS-CoV-2 viral copies this assay can detect is 138 copies/mL. A negative result does not preclude SARS-Cov-2 infection and should not be used as the sole basis for treatment or other patient management decisions. A negative result may occur with  improper specimen collection/handling, submission of specimen other than nasopharyngeal swab, presence of viral mutation(s) within the areas targeted by this assay, and inadequate number of viral copies(<138 copies/mL). A negative result must be combined with clinical observations, patient history, and  epidemiological information. The expected result is Negative.  Fact Sheet for Patients:  EntrepreneurPulse.com.au  Fact Sheet for Healthcare Providers:  IncredibleEmployment.be  This test is no t yet approved or cleared by the [place] FDA and  has been authorized for detection and/or diagnosis of SARS-CoV-2 by FDA under an Emergency Use Authorization (EUA). This EUA will remain  in effect (meaning this test can be used) for the duration of the COVID-19 declaration under Section 564(b)(1) of the Act, 21 U.S.C.section 360bbb-3(b)(1), unless the authorization is terminated  or revoked sooner.       Influenza A by PCR NEGATIVE NEGATIVE Final   Influenza B by PCR NEGATIVE NEGATIVE Final    Comment: (NOTE) The Xpert Xpress SARS-CoV-2/FLU/RSV plus assay is intended as an aid in the diagnosis of influenza from Nasopharyngeal swab specimens and should not be used as a sole basis for treatment. Nasal washings and aspirates are unacceptable for Xpert Xpress SARS-CoV-2/FLU/RSV testing.  Fact Sheet for Patients: EntrepreneurPulse.com.au  Fact Sheet for Healthcare Providers: IncredibleEmployment.be  This test is not yet approved or cleared by the [place] FDA and has been authorized for detection and/or diagnosis of SARS-CoV-2 by FDA under an Emergency Use Authorization (EUA). This EUA will remain in effect (meaning this test can be used) for the duration of the COVID-19 declaration under Section 564(b)(1) of the Act, 21 U.S.C. section 360bbb-3(b)(1), unless the authorization is terminated or revoked.  Performed at [facility], [place] [address], [place], [place] [address]   MRSA Next Gen by PCR, Nasal     Status: None   Collection Time: 12/28/21  4:35 AM   Specimen: Nasal Mucosa; Nasal Swab  Result Value Ref Range Status   MRSA by PCR Next Gen NOT DETECTED NOT DETECTED Final    Comment:  (NOTE) The GeneXpert MRSA Assay (FDA approved for NASAL specimens only), is one component of a comprehensive MRSA colonization surveillance program. It is not intended to diagnose MRSA infection nor to guide or monitor treatment for MRSA infections. Test performance is not FDA approved in patients less than 53 years old. Performed at [facility], [place] [address] [place], [place] [address]      Labs: Basic Metabolic Panel: Recent Labs  Lab 12/28/21 0017 12/28/21 0023 12/28/21 0329 12/30/21 0623  NA 141 142 141 136  K 3.7 3.8 3.9 3.6  CL 112* 110 110 105  CO2 21*  --  19* 18*  GLUCOSE 93 91 103* 106*  BUN <5* <3* <5* 10  CREATININE 0.70 0.60 0.73 0.70  CALCIUM 8.1*  --  8.1* 9.4  MG  --   --  1.8 1.9  PHOS  --   --  3.1 4.1   Liver Function Tests: Recent Labs  Lab 12/28/21 0017 12/28/21 0329 12/30/21 0623  AST '15 17 19  '$ ALT '12 11 11  '$ ALKPHOS 56 57 65  BILITOT 0.3 0.3 0.3  PROT 5.6* 5.9* 7.7  ALBUMIN 3.2* 3.3* 4.3   CBC: Recent Labs  Lab 12/28/21 0017 12/28/21 0023 12/28/21 0329 12/30/21 0623  WBC 6.8  --  8.5 10.9*  NEUTROABS 3.5  --   --   --   HGB 10.6* 10.5* 11.2* 13.1  HCT 34.7* 31.0* 36.0 40.1  MCV 83.4  --  83.5 80.0  PLT 142*  --  262 254   CBG: No results for input(s): "GLUCAP" in the last 168 hours. Hgb A1c No results for input(s): "HGBA1C" in the last 72 hours. Lipid Profile No results for input(s): "CHOL", "HDL", "LDLCALC", "TRIG", "CHOLHDL", "LDLDIRECT" in the last 72 hours. Thyroid function studies No results for input(s): "TSH", "T4TOTAL", "T3FREE", "THYROIDAB" in the last 72 hours.  Invalid input(s): "FREET3" Urinalysis    Component Value Date/Time   COLORURINE YELLOW 12/28/2021 0006   APPEARANCEUR CLEAR 12/28/2021 0006   LABSPEC 1.006 12/28/2021 0006   PHURINE 8.0 12/28/2021 0006   GLUCOSEU NEGATIVE 12/28/2021 0006   HGBUR NEGATIVE 12/28/2021 0006   BILIRUBINUR NEGATIVE 12/28/2021 0006   BILIRUBINUR negative 06/14/2015  0930   KETONESUR NEGATIVE 12/28/2021 0006   PROTEINUR NEGATIVE 12/28/2021 0006   UROBILINOGEN 0.2 10/24/2015 1428   NITRITE NEGATIVE 12/28/2021 0006   LEUKOCYTESUR SMALL (A) 12/28/2021 0006    FURTHER DISCHARGE INSTRUCTIONS:   Get Medicines reviewed and adjusted: Please take all your medications with you for your next visit with your Primary MD   Laboratory/radiological data: Please request your Primary MD to go over all hospital tests and procedure/radiological results at the follow up, please ask your Primary MD to get all Hospital records sent to his/her office.   In some cases, they will be blood work, cultures and biopsy results pending at the time of your discharge. Please request that your primary care M.D. goes through all the records of your hospital data and follows up on these results.   Also Note the following: If you experience worsening of your admission symptoms, develop shortness of breath, life threatening emergency, suicidal or homicidal thoughts you must seek medical attention immediately by calling 911 or calling your MD immediately  if symptoms less severe.   You must read complete instructions/literature along with all the possible adverse reactions/side effects for all the Medicines you take and that have been prescribed to you. Take any new Medicines after you have completely understood and accpet all the possible adverse reactions/side effects.    Do not drive when taking Pain medications or sleeping medications (Benzodaizepines)   Do not take more than prescribed Pain, Sleep and Anxiety Medications. It is not advisable to combine anxiety,sleep and pain medications without talking with your primary care practitioner   Special Instructions: If you have smoked or chewed Tobacco  in the last 2 yrs please stop smoking, stop any regular Alcohol  and or any Recreational drug use.   Wear Seat belts while driving.   Please note: You were cared for by a hospitalist  during your hospital stay. Once you are discharged, your primary care physician will handle any further medical issues. Please note that NO REFILLS for any discharge medications will be authorized once you are discharged, as it is imperative that you return to your primary  care physician (or establish a relationship with a primary care physician if you do not have one) for your post hospital discharge needs so that they can reassess your need for medications and monitor your lab values.  Time coordinating discharge: 35 minutes  SIGNED:  Marzetta Board, MD, PhD 12/30/2021, 9:02 AM

## 2021-12-29 NOTE — Progress Notes (Signed)
Nutrition Brief Note  Patient identified on the Malnutrition Screening Tool (MST) Report  Wt Readings from Last 15 Encounters:  12/29/21 96.1 kg  11/29/21 90.7 kg  11/01/21 90.7 kg  10/29/21 91.6 kg  10/13/21 89.8 kg  10/04/21 92.6 kg  07/29/21 92.6 kg  07/21/21 92.6 kg  07/18/21 93.5 kg  07/03/21 94.7 kg  06/23/21 92.5 kg  06/03/21 93.4 kg  05/29/21 94.8 kg  05/01/21 98.9 kg  03/30/21 99.1 kg    Body mass index is 36.37 kg/m. Patient meets criteria for obese based on current BMI.   Current diet order is regular, patient is consuming approximately 100% of meals at this time. Labs and medications reviewed. Last documented BM 11/24. Pressors are now off, MAP maintaining >65.   No nutrition interventions warranted at this time. If nutrition issues arise, please consult RD.   Candise Bowens, MS, RD, LDN, CNSC See AMiON for contact information

## 2021-12-30 LAB — CBC
HCT: 40.1 % (ref 36.0–46.0)
Hemoglobin: 13.1 g/dL (ref 12.0–15.0)
MCH: 26.1 pg (ref 26.0–34.0)
MCHC: 32.7 g/dL (ref 30.0–36.0)
MCV: 80 fL (ref 80.0–100.0)
Platelets: 254 10*3/uL (ref 150–400)
RBC: 5.01 MIL/uL (ref 3.87–5.11)
RDW: 13.9 % (ref 11.5–15.5)
WBC: 10.9 10*3/uL — ABNORMAL HIGH (ref 4.0–10.5)
nRBC: 0 % (ref 0.0–0.2)

## 2021-12-30 LAB — HEPATIC FUNCTION PANEL
ALT: 11 U/L (ref 0–44)
AST: 19 U/L (ref 15–41)
Albumin: 4.3 g/dL (ref 3.5–5.0)
Alkaline Phosphatase: 65 U/L (ref 38–126)
Bilirubin, Direct: <0.1 mg/dL (ref 0.0–0.2)
Total Bilirubin: 0.3 mg/dL (ref 0.3–1.2)
Total Protein: 7.7 g/dL (ref 6.5–8.1)

## 2021-12-30 LAB — BASIC METABOLIC PANEL
Anion gap: 13 (ref 5–15)
BUN: 10 mg/dL (ref 6–20)
CO2: 18 mmol/L — ABNORMAL LOW (ref 22–32)
Calcium: 9.4 mg/dL (ref 8.9–10.3)
Chloride: 105 mmol/L (ref 98–111)
Creatinine, Ser: 0.7 mg/dL (ref 0.44–1.00)
GFR, Estimated: >60 mL/min (ref >60)
Glucose, Bld: 106 mg/dL — ABNORMAL HIGH (ref 70–99)
Potassium: 3.6 mmol/L (ref 3.5–5.1)
Sodium: 136 mmol/L (ref 135–145)

## 2021-12-30 LAB — MAGNESIUM: Magnesium: 1.9 mg/dL (ref 1.7–2.4)

## 2021-12-30 LAB — PHOSPHORUS: Phosphorus: 4.1 mg/dL (ref 2.5–4.6)

## 2021-12-30 LAB — SARS CORONAVIRUS 2 BY RT PCR: SARS Coronavirus 2 by RT PCR: NEGATIVE

## 2021-12-30 MED ORDER — IBUPROFEN 600 MG PO TABS
600.0000 mg | ORAL_TABLET | ORAL | Status: AC
  Administered 2021-12-30: 600 mg via ORAL
  Filled 2021-12-30: qty 1

## 2021-12-30 MED ORDER — LORAZEPAM 0.5 MG PO TABS
0.5000 mg | ORAL_TABLET | Freq: Once | ORAL | Status: AC
  Administered 2021-12-30: 0.5 mg via ORAL
  Filled 2021-12-30: qty 1

## 2021-12-30 NOTE — Progress Notes (Signed)
Mobility Specialist Progress Note:   12/30/21 1530  Mobility  Activity Refused mobility   Pt refused mobility d/t not wanting anything to do with this hospital and everything being meaningless. Will f/u as able.    Andrey Campanile Mobility Specialist Please contact via SecureChat or  Rehab office at [phone]

## 2021-12-30 NOTE — Plan of Care (Signed)

## 2021-12-30 NOTE — Plan of Care (Signed)
  Problem: Education: Goal: Knowledge of General Education information will improve Description: Including pain rating scale, medication(s)/side effects and non-pharmacologic comfort measures Outcome: Progressing   Problem: Coping: Goal: Level of anxiety will decrease Outcome: Progressing   
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
## 2021-12-30 NOTE — TOC Progression Note (Signed)
Transition of Care [facility]) - Progression Note    Patient Details  Name: [name] MRN: [id number] Date of Birth: 1990-06-10  Transition of Care [facility]) CM/SW [place], LCSW Phone Number: 12/30/2021, 8:43 AM  Clinical Narrative:    [facility] checking their bed status but requested updated COVID test in case they can accept patient. CSW requested from MD.    Expected Discharge Plan: Psychiatric Hospital Barriers to Discharge: Psych Bed not available  Expected Discharge Plan and Services Expected Discharge Plan: [facility] In-house Referral: Clinical Social Work                                             Social Determinants of Health (SDOH) Interventions Food Insecurity Interventions: Inpatient TOC Housing Interventions: Inpatient TOC Transportation Interventions: Inpatient TOC, Contracted Vendor Utilities Interventions: Inpatient TOC  Readmission Risk Interventions     No data to display

## 2021-12-30 NOTE — Progress Notes (Signed)
Pt is non compliant.Refused her vital signs and her morning labs .

## 2021-12-30 NOTE — Progress Notes (Addendum)
Patient seen this morning, very verbal that she wants to go home, does not want to go to any behavioral hospital, extremely pressured speech, continuously interrupting me when I try to reason and have a discussion with her.    From a medical standpoint she is stable to leave the acute care hospital and be transferred to inpatient psychiatric unit  Be[place] M. Cruzita Lederer, MD, PhD [facility]  Between 7 am - 7 pm you can contact me via Amion (for emergencies) or [place] (non urgent matters).  I am not available 7 pm - 7 am, please contact night coverage MD/APP via Amion

## 2021-12-31 MED ORDER — LORAZEPAM 1 MG PO TABS
1.0000 mg | ORAL_TABLET | ORAL | Status: DC | PRN
  Administered 2021-12-31: 1 mg via ORAL
  Filled 2021-12-31: qty 1

## 2021-12-31 MED ORDER — CARIPRAZINE HCL 1.5 MG PO CAPS
1.5000 mg | ORAL_CAPSULE | Freq: Every day | ORAL | Status: DC
  Administered 2022-01-01: 1.5 mg via ORAL
  Filled 2021-12-31: qty 1

## 2021-12-31 MED ORDER — LORAZEPAM 2 MG/ML IJ SOLN: 2.0000 mg | INTRAMUSCULAR | Status: DC | PRN

## 2021-12-31 MED ORDER — LORAZEPAM 2 MG/ML IJ SOLN: 1.0000 mg | INTRAMUSCULAR | Status: DC | PRN

## 2021-12-31 MED ORDER — LORAZEPAM 2 MG/ML IJ SOLN
1.0000 mg | Freq: Once | INTRAMUSCULAR | Status: AC
  Administered 2021-12-31: 1 mg via INTRAMUSCULAR

## 2021-12-31 MED ORDER — LORAZEPAM 1 MG PO TABS
1.0000 mg | ORAL_TABLET | ORAL | Status: DC | PRN
  Administered 2022-01-01: 1 mg via ORAL
  Filled 2021-12-31: qty 1

## 2021-12-31 MED ORDER — MELATONIN 5 MG PO TABS
5.0000 mg | ORAL_TABLET | Freq: Every evening | ORAL | Status: DC | PRN
  Administered 2021-12-31: 5 mg via ORAL
  Filled 2021-12-31: qty 1

## 2021-12-31 MED ORDER — LORAZEPAM 2 MG/ML IJ SOLN
INTRAMUSCULAR | Status: AC
  Administered 2021-12-31: 1 mg via INTRAMUSCULAR
  Filled 2021-12-31: qty 1

## 2021-12-31 NOTE — Progress Notes (Addendum)
Pt showed increased agitation this afternoon. 1 - 1 sitter called this nurse into room. Pt stated angrily, and tearful to this nurse again "I want to be left alone. You all keep talking about me." Pt then stood up and placed bed connection cord around neck, and stated "I want yall to leave me alone, you can't keep me in here. I want to go home. I've been here for 4 days."  1- 1 sitter then took bed connection cord from around pts neck then pt stomped around the room then slammed bathroom door shut as she made her way to recliner in room. This nurse, charge nurse, and another staff verbally deescalated pts behavior. While pt was sitting in chair, this nurse and another staff nurse performed safety check throughout room. Charge nurse explained that pt will be left alone and continued to provide emotional support to pt. Pt stated her understanding, and is now lying in bed with 1 - 1 sitter in room.

## 2021-12-31 NOTE — Progress Notes (Signed)
Patient remains stable for discharge.  COVID-negative yesterday evening.  Appreciate psychiatry follow-up  Jasie Meleski M. Cruzita Lederer, MD, PhD [facility]  Between 7 am - 7 pm you can contact me via Amion (for emergencies) or [place] (non urgent matters).  I am not available 7 pm - 7 am, please contact night coverage MD/APP via Amion
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
## 2021-12-31 NOTE — Progress Notes (Signed)
   12/31/21 0742  Assess: MEWS Score  BP 112/74  MAP (mmHg) 86  Pulse Rate (!) 115  Level of Consciousness Alert  Assess: MEWS Score  MEWS Temp 0  MEWS Systolic 0  MEWS Pulse 2  MEWS RR 0  MEWS LOC 0  MEWS Score 2  MEWS Score Color Yellow  Assess: if the MEWS score is Yellow or Red  Were vital signs taken at a resting state? Yes  Focused Assessment No change from prior assessment  Does the patient meet 2 or more of the SIRS criteria? Yes  Does the patient have a confirmed or suspected source of infection? No  MEWS guidelines implemented *See Row Information* Yes  Treat  Pain Scale 0-10  Pain Score 0  Take Vital Signs  Increase Vital Sign Frequency  Yellow: Q 2hr X 2 then Q 4hr X 2, if remains yellow, continue Q 4hrs  Escalate  MEWS: Escalate Yellow: discuss with charge nurse/RN and consider discussing with provider and RRT  Notify: Charge Nurse/RN  Name of Charge Nurse/RN Notified Ryan  Date Charge Nurse/RN Notified 12/31/21  Time Charge Nurse/RN Notified 0800  Document  Progress note created (see row info) Yes  Assess: SIRS CRITERIA  SIRS Temperature  0  SIRS Pulse 1  SIRS Respirations  0  SIRS WBC 1  SIRS Score Sum  2

## 2021-12-31 NOTE — Progress Notes (Signed)
Pt too agitated at this time to obtain accurate vital signs
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
## 2021-12-31 NOTE — Progress Notes (Signed)
Pt is refusing medications and vital signs. She is sitting quietly in her room. When asked how she is feeling she says that, "everyone is trying to mess things up." She is calm and denies thoughts of self-harm, or suicide. Denies thoughts of harming others.

## 2021-12-31 NOTE — Progress Notes (Signed)
Patient refused morning labs and vitals.

## 2021-12-31 NOTE — Progress Notes (Signed)
Pt refused vital signs.

## 2022-01-01 ENCOUNTER — Encounter (HOSPITAL_COMMUNITY): Payer: Self-pay | Admitting: Student

## 2022-01-01 ENCOUNTER — Other Ambulatory Visit: Payer: Self-pay

## 2022-01-01 ENCOUNTER — Inpatient Hospital Stay (HOSPITAL_COMMUNITY)
Admission: AD | Admit: 2022-01-01 | Discharge: 2022-01-11 | DRG: 885 | Disposition: A | Discharge status: Home / Self Care | Payer: PPO | Source: Other Acute Inpatient Hospital | Attending: Psychiatry | Admitting: Psychiatry

## 2022-01-01 DIAGNOSIS — T446X2A Poisoning by alpha-adrenoreceptor antagonists, intentional self-harm, initial encounter: Secondary | ICD-10-CM | POA: Diagnosis present

## 2022-01-01 DIAGNOSIS — F845 Asperger's syndrome: Secondary | ICD-10-CM | POA: Diagnosis present

## 2022-01-01 DIAGNOSIS — Z818 Family history of other mental and behavioral disorders: Secondary | ICD-10-CM | POA: Diagnosis not present

## 2022-01-01 DIAGNOSIS — F410 Panic disorder [episodic paroxysmal anxiety] without agoraphobia: Secondary | ICD-10-CM | POA: Diagnosis present

## 2022-01-01 DIAGNOSIS — T471X2A Poisoning by other antacids and anti-gastric-secretion drugs, intentional self-harm, initial encounter: Secondary | ICD-10-CM | POA: Diagnosis present

## 2022-01-01 DIAGNOSIS — R000 Tachycardia, unspecified: Secondary | ICD-10-CM | POA: Diagnosis present

## 2022-01-01 DIAGNOSIS — F411 Generalized anxiety disorder: Secondary | ICD-10-CM | POA: Diagnosis not present

## 2022-01-01 DIAGNOSIS — Z560 Unemployment, unspecified: Secondary | ICD-10-CM

## 2022-01-01 DIAGNOSIS — T43592A Poisoning by other antipsychotics and neuroleptics, intentional self-harm, initial encounter: Secondary | ICD-10-CM | POA: Diagnosis present

## 2022-01-01 DIAGNOSIS — F251 Schizoaffective disorder, depressive type: Secondary | ICD-10-CM | POA: Diagnosis not present

## 2022-01-01 DIAGNOSIS — F4310 Post-traumatic stress disorder, unspecified: Secondary | ICD-10-CM | POA: Diagnosis not present

## 2022-01-01 DIAGNOSIS — G4700 Insomnia, unspecified: Secondary | ICD-10-CM | POA: Diagnosis not present

## 2022-01-01 DIAGNOSIS — Z9151 Personal history of suicidal behavior: Secondary | ICD-10-CM

## 2022-01-01 DIAGNOSIS — M797 Fibromyalgia: Secondary | ICD-10-CM | POA: Diagnosis not present

## 2022-01-01 DIAGNOSIS — Z91030 Bee allergy status: Secondary | ICD-10-CM

## 2022-01-01 DIAGNOSIS — R109 Unspecified abdominal pain: Secondary | ICD-10-CM | POA: Diagnosis not present

## 2022-01-01 DIAGNOSIS — F332 Major depressive disorder, recurrent severe without psychotic features: Secondary | ICD-10-CM | POA: Diagnosis not present

## 2022-01-01 DIAGNOSIS — R1011 Right upper quadrant pain: Secondary | ICD-10-CM | POA: Diagnosis not present

## 2022-01-01 DIAGNOSIS — T450X2A Poisoning by antiallergic and antiemetic drugs, intentional self-harm, initial encounter: Secondary | ICD-10-CM | POA: Diagnosis present

## 2022-01-01 DIAGNOSIS — K219 Gastro-esophageal reflux disease without esophagitis: Secondary | ICD-10-CM | POA: Diagnosis present

## 2022-01-01 DIAGNOSIS — Z91048 Other nonmedicinal substance allergy status: Secondary | ICD-10-CM

## 2022-01-01 DIAGNOSIS — Z5982 Transportation insecurity: Secondary | ICD-10-CM

## 2022-01-01 DIAGNOSIS — T481X2A Poisoning by skeletal muscle relaxants [neuromuscular blocking agents], intentional self-harm, initial encounter: Secondary | ICD-10-CM | POA: Diagnosis not present

## 2022-01-01 DIAGNOSIS — T447X2A Poisoning by beta-adrenoreceptor antagonists, intentional self-harm, initial encounter: Secondary | ICD-10-CM | POA: Diagnosis not present

## 2022-01-01 DIAGNOSIS — F17210 Nicotine dependence, cigarettes, uncomplicated: Secondary | ICD-10-CM | POA: Diagnosis not present

## 2022-01-01 DIAGNOSIS — T43222A Poisoning by selective serotonin reuptake inhibitors, intentional self-harm, initial encounter: Secondary | ICD-10-CM | POA: Diagnosis not present

## 2022-01-01 DIAGNOSIS — Z79899 Other long term (current) drug therapy: Secondary | ICD-10-CM | POA: Diagnosis not present

## 2022-01-01 DIAGNOSIS — Z808 Family history of malignant neoplasm of other organs or systems: Secondary | ICD-10-CM

## 2022-01-01 DIAGNOSIS — K760 Fatty (change of) liver, not elsewhere classified: Secondary | ICD-10-CM | POA: Diagnosis not present

## 2022-01-01 DIAGNOSIS — Z882 Allergy status to sulfonamides status: Secondary | ICD-10-CM

## 2022-01-01 DIAGNOSIS — I10 Essential (primary) hypertension: Secondary | ICD-10-CM | POA: Diagnosis present

## 2022-01-01 DIAGNOSIS — J45909 Unspecified asthma, uncomplicated: Secondary | ICD-10-CM | POA: Diagnosis not present

## 2022-01-01 DIAGNOSIS — R1013 Epigastric pain: Secondary | ICD-10-CM | POA: Diagnosis not present

## 2022-01-01 DIAGNOSIS — Z825 Family history of asthma and other chronic lower respiratory diseases: Secondary | ICD-10-CM | POA: Diagnosis not present

## 2022-01-01 DIAGNOSIS — F603 Borderline personality disorder: Secondary | ICD-10-CM | POA: Diagnosis not present

## 2022-01-01 DIAGNOSIS — Z7289 Other problems related to lifestyle: Secondary | ICD-10-CM

## 2022-01-01 DIAGNOSIS — F250 Schizoaffective disorder, bipolar type: Secondary | ICD-10-CM | POA: Diagnosis not present

## 2022-01-01 DIAGNOSIS — Z885 Allergy status to narcotic agent status: Secondary | ICD-10-CM

## 2022-01-01 DIAGNOSIS — Z833 Family history of diabetes mellitus: Secondary | ICD-10-CM

## 2022-01-01 DIAGNOSIS — Z886 Allergy status to analgesic agent status: Secondary | ICD-10-CM

## 2022-01-01 DIAGNOSIS — Z91013 Allergy to seafood: Secondary | ICD-10-CM

## 2022-01-01 DIAGNOSIS — Z7951 Long term (current) use of inhaled steroids: Secondary | ICD-10-CM

## 2022-01-01 DIAGNOSIS — G4730 Sleep apnea, unspecified: Secondary | ICD-10-CM | POA: Diagnosis not present

## 2022-01-01 DIAGNOSIS — Z888 Allergy status to other drugs, medicaments and biological substances status: Secondary | ICD-10-CM

## 2022-01-01 DIAGNOSIS — R1084 Generalized abdominal pain: Secondary | ICD-10-CM | POA: Diagnosis not present

## 2022-01-01 DIAGNOSIS — Z820 Family history of epilepsy and other diseases of the nervous system: Secondary | ICD-10-CM

## 2022-01-01 DIAGNOSIS — Z1152 Encounter for screening for COVID-19: Secondary | ICD-10-CM | POA: Diagnosis not present

## 2022-01-01 DIAGNOSIS — Z634 Disappearance and death of family member: Secondary | ICD-10-CM

## 2022-01-01 DIAGNOSIS — K0889 Other specified disorders of teeth and supporting structures: Secondary | ICD-10-CM | POA: Diagnosis present

## 2022-01-01 DIAGNOSIS — G928 Other toxic encephalopathy: Secondary | ICD-10-CM | POA: Diagnosis not present

## 2022-01-01 DIAGNOSIS — T1491XA Suicide attempt, initial encounter: Secondary | ICD-10-CM | POA: Diagnosis not present

## 2022-01-01 LAB — CBC
HCT: 37.6 % (ref 36.0–46.0)
Hemoglobin: 12.1 g/dL (ref 12.0–15.0)
MCH: 25.6 pg — ABNORMAL LOW (ref 26.0–34.0)
MCHC: 32.2 g/dL (ref 30.0–36.0)
MCV: 79.7 fL — ABNORMAL LOW (ref 80.0–100.0)
Platelets: 271 10*3/uL (ref 150–400)
RBC: 4.72 MIL/uL (ref 3.87–5.11)
RDW: 13.8 % (ref 11.5–15.5)
WBC: 8.7 10*3/uL (ref 4.0–10.5)
nRBC: 0 % (ref 0.0–0.2)

## 2022-01-01 LAB — HEPATIC FUNCTION PANEL
ALT: 14 U/L (ref 0–44)
AST: 18 U/L (ref 15–41)
Albumin: 4 g/dL (ref 3.5–5.0)
Alkaline Phosphatase: 57 U/L (ref 38–126)
Bilirubin, Direct: <0.1 mg/dL (ref 0.0–0.2)
Total Bilirubin: 0.7 mg/dL (ref 0.3–1.2)
Total Protein: 7.5 g/dL (ref 6.5–8.1)

## 2022-01-01 LAB — BASIC METABOLIC PANEL
Anion gap: 10 (ref 5–15)
BUN: 10 mg/dL (ref 6–20)
CO2: 21 mmol/L — ABNORMAL LOW (ref 22–32)
Calcium: 9.2 mg/dL (ref 8.9–10.3)
Chloride: 104 mmol/L (ref 98–111)
Creatinine, Ser: 0.7 mg/dL (ref 0.44–1.00)
GFR, Estimated: >60 mL/min (ref >60)
Glucose, Bld: 106 mg/dL — ABNORMAL HIGH (ref 70–99)
Potassium: 3.4 mmol/L — ABNORMAL LOW (ref 3.5–5.1)
Sodium: 135 mmol/L (ref 135–145)

## 2022-01-01 LAB — PHOSPHORUS: Phosphorus: 3.8 mg/dL (ref 2.5–4.6)

## 2022-01-01 LAB — MAGNESIUM: Magnesium: 2 mg/dL (ref 1.7–2.4)

## 2022-01-01 LAB — SARS CORONAVIRUS 2 BY RT PCR: SARS Coronavirus 2 by RT PCR: NEGATIVE

## 2022-01-01 MED ORDER — ZIPRASIDONE MESYLATE 20 MG IM SOLR: 20.0000 mg | INTRAMUSCULAR | Status: DC | PRN

## 2022-01-01 MED ORDER — ALBUTEROL SULFATE HFA 108 (90 BASE) MCG/ACT IN AERS: 2.0000 | INHALATION_SPRAY | Freq: Four times a day (QID) | RESPIRATORY_TRACT | Status: DC | PRN

## 2022-01-01 MED ORDER — HYDROXYZINE HCL 25 MG PO TABS
25.0000 mg | ORAL_TABLET | Freq: Three times a day (TID) | ORAL | Status: DC | PRN
  Administered 2022-01-01 – 2022-01-10 (×16): 25 mg via ORAL
  Filled 2022-01-01 (×17): qty 1

## 2022-01-01 MED ORDER — POTASSIUM CHLORIDE CRYS ER 20 MEQ PO TBCR: 40.0000 meq | EXTENDED_RELEASE_TABLET | Freq: Once | ORAL | Status: DC

## 2022-01-01 MED ORDER — TRAZODONE HCL 50 MG PO TABS
50.0000 mg | ORAL_TABLET | Freq: Every evening | ORAL | Status: DC | PRN
  Administered 2022-01-01: 50 mg via ORAL
  Filled 2022-01-01 (×2): qty 1

## 2022-01-01 MED ORDER — FLUTICASONE FUROATE-VILANTEROL 200-25 MCG/ACT IN AEPB
1.0000 | INHALATION_SPRAY | Freq: Every day | RESPIRATORY_TRACT | Status: DC
  Administered 2022-01-01 – 2022-01-10 (×10): 1 via RESPIRATORY_TRACT
  Filled 2022-01-01: qty 28

## 2022-01-01 MED ORDER — OLANZAPINE 5 MG PO TBDP
5.0000 mg | ORAL_TABLET | Freq: Three times a day (TID) | ORAL | Status: DC | PRN
  Administered 2022-01-01: 5 mg via ORAL
  Filled 2022-01-01: qty 1

## 2022-01-01 MED ORDER — MAGNESIUM HYDROXIDE 400 MG/5ML PO SUSP: 30.0000 mL | Freq: Every day | ORAL | Status: DC | PRN

## 2022-01-01 MED ORDER — LORAZEPAM 1 MG PO TABS
1.0000 mg | ORAL_TABLET | ORAL | Status: AC | PRN
  Administered 2022-01-03: 1 mg via ORAL
  Filled 2022-01-01: qty 1

## 2022-01-01 MED ORDER — ALUM & MAG HYDROXIDE-SIMETH 200-200-20 MG/5ML PO SUSP
30.0000 mL | ORAL | Status: DC | PRN
  Administered 2022-01-04 – 2022-01-07 (×4): 30 mL via ORAL
  Filled 2022-01-01 (×4): qty 30

## 2022-01-01 NOTE — Care Management Important Message (Signed)
Important Message  Patient Details  Name: [name] MRN: [id number] Date of Birth: [birth year]   Medicare Important Message Given:  Yes     [name] 01/01/2022, 3:31 PM
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
## 2022-01-01 NOTE — Progress Notes (Addendum)
Pt was accepted to [facility] [place] 01/01/22 PENDING vitals, 15 min covid, and IVC/Voluntary. FAXED TO [phone];[facility] ASSIGNMENT 307-1  Dx MDD, intentional OD.   Pt meets inpatient criteria per Corky Sox MD.   Attending Physician will be [place] MD.  Report can be called to: Adult unit: [phone]  Pt can arrive after [place] will coordinate with care team  Care Team notified: [facility] Lynnda Shields, RN, Dr. [name]. Corky Sox, MD, Tracie Harrier, RN, Cedric Fishman, LCSW, Costin Gherghe,MD  [place], LCSWA 01/01/2022 @ 11:33 AM

## 2022-01-01 NOTE — Progress Notes (Signed)
Pt did not attend the evening group.

## 2022-01-01 NOTE — TOC Transition Note (Signed)
Transition of Care [facility]) - CM/SW Discharge Note   Patient Details  Name: [name] MRN: [id number] Date of Birth: 1990-09-26  Transition of Care [facility]) CM/SW Contact:  Benard Halsted, [place] Phone Number: 01/01/2022, 1:29 PM   Clinical Narrative:    Patient will DC to: [facility] Anticipated DC date: 01/01/22 Transport by: [facility] (IVC'd)   Per MD patient ready for DC to [facility]. RN to call report prior to discharge [phone]). RN and facility notified of DC. IVC on chart and faxed to [facility]. COVID test completed.   CSW will sign off for now as social work intervention is no longer needed. Please consult [name] again if new needs arise.     Final next level of care: Psychiatric Hospital Barriers to Discharge: Psych Bed not available   Patient Goals and CMS Choice Patient states their goals for this hospitalization and ongoing recovery are:: Stabalization      Discharge Placement                Patient to be transferred to facility by: [employer]   Patient and family notified of of transfer: 01/01/22  Discharge Plan and Services In-house Referral: Clinical Social Work                                   Social Determinants of Health (SDOH) Interventions Food Insecurity Interventions: Inpatient TOC Housing Interventions: Inpatient TOC Transportation Interventions: Inpatient TOC, Contracted Vendor Utilities Interventions: Inpatient TOC   Readmission Risk Interventions     No data to display

## 2022-01-01 NOTE — Progress Notes (Signed)
Patient endorsing Passive SI with no plan verbally contracts for safety. Patient in group at present. Support and encouragement provided. Denies HI/A/VH. Q 15 minutes safety checks ongoing.
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
## 2022-01-01 NOTE — Discharge Summary (Signed)
Physician Discharge Summary  Garima Chronis URK:[id number] DOB: Jun 26, 1990 DOA: 12/27/2021  PCP: [name]  Admit date: 12/27/2021 Discharge date: 01/01/2022  Admitted From: home Disposition:  [facility]  Recommendations for Outpatient Follow-up:  Follow up with PCP in 1-2 weeks after [facility] discharge  Home Health: none Equipment/Devices: none  Discharge Condition: stable CODE STATUS: Full code Diet Orders (From admission, onward)     Start     Ordered   12/28/21 1123  Diet regular Room service appropriate? Yes; Fluid consistency: Thin  Diet effective now       Question Answer Comment  Room service appropriate? Yes   Fluid consistency: Thin      12/28/21 1122            HPI: Per admitting MD, 31 y.o. F with PMH significant for schizoaffective disorder, personality disorder, bipolar, seizures, anxiety/depression who presented after taking 6 days of her home medications.  Reported #6 '40mg'$  Zofran pills, #6 '25mg'$  Atenolol pills, #6 '10mg'$  Cyclobenzaprine pills, #18 '5mg'$  escitalopram pills, #18 '10mg'$  Hydroxyz pills, #6 '40mg'$  pantroprazole pills, #6 '1mg'$  Prazosin hcl pills, #18 '50mg'$  pregabalin pills, #3 '25mg'$  Quetiapine pills, and #6 1.'5mg'$  vraylar pills. Per EMS run sheet, pt was upset about a friend situation and tried to intentionally harm herself then called the crisis line.   Hospital Course / Discharge diagnoses: Principal Problem:   Suicide attempt [facility]) Active Problems:   Borderline personality disorder ([place])  Suicide attempt by medication overdose-patient was initially admitted to the ICU as he required pressors.  She improved, was transferred to the hospitalist service on 11/24.  She remained stable, she is back to normal, psychiatry consulted and the recommendation inpatient psych.  She is medically cleared to go to psychiatric unit  Active problems Acute metabolic encephalopathy, bradycardia, hypotension-all resolved Bipolar/borderline disorder, depression,  anxiety-further management by inpatient psychiatry  Sepsis ruled out   Discharge Instructions   Allergies as of 01/01/2022       Reactions   Bee Venom Anaphylaxis   Coconut Flavor Anaphylaxis, Rash   Fish Allergy Anaphylaxis   Geodon [ziprasidone Hcl] Other (See Comments)   Pt states that this medication causes paralysis of the mouth.     Haloperidol And Related Other (See Comments)   Pt states that this medication causes paralysis of the mouth, jaw locks up   Lithobid [lithium] Other (See Comments)   Seizure-like activity   Roxicodone [oxycodone] Other (See Comments)   Hallucinations    Seroquel [quetiapine] Other (See Comments)   Severe drowsiness Pt currently taking 12.5 mg BID, she is ok with this dose   Shellfish Allergy Anaphylaxis   Phenergan [promethazine Hcl] Other (See Comments)   Chest pain    Prilosec [omeprazole] Nausea And Vomiting   Pt can take protonix with no problems   Sulfa Antibiotics Other (See Comments)   Chest pain    Tegretol [carbamazepine] Nausea And Vomiting   Prozac [fluoxetine] Other (See Comments)   Increased Depression Suicidal thoughts   Tape Other (See Comments)   Skin tears, can only tolerate paper tape.   Tylenol [acetaminophen] Other (See Comments)   Unknown reaction Pt states she can not take        Medication List     STOP taking these medications    propranolol 10 MG tablet Commonly known as: INDERAL       TAKE these medications    albuterol 108 (90 Base) MCG/ACT inhaler Commonly known as: VENTOLIN HFA Inhale 2 puffs into the  lungs every 6 (six) hours as needed for wheezing or shortness of breath.   bismuth subsalicylate 416 MG chewable tablet Commonly known as: PEPTO BISMOL Chew 524 mg by mouth daily as needed (stomach upset).   budesonide-formoterol 160-4.5 MCG/ACT inhaler Commonly known as: SYMBICORT Inhale 2 puffs into the lungs 2 (two) times daily.   cariprazine 1.5 MG capsule Commonly known as:  VRAYLAR Take 1 capsule (1.5 mg total) by mouth daily. What changed: when to take this   cyclobenzaprine 10 MG tablet Commonly known as: FLEXERIL Take 10 mg by mouth at bedtime.   diclofenac Sodium 1 % Gel Commonly known as: Voltaren Apply 2 g topically 4 (four) times daily. What changed:  when to take this reasons to take this   EMERGEN-C IMMUNE PO Take 1 packet by mouth daily as needed (Immune support).   escitalopram 5 MG tablet Commonly known as: LEXAPRO Take 3 tablets (15 mg total) by mouth daily. What changed: when to take this   hydrOXYzine 25 MG tablet Commonly known as: ATARAX Take 25 mg by mouth 3 (three) times daily as needed for anxiety.   ICY HOT MAX LIDOCAINE EX Apply 1 Application topically daily as needed (pain).   neomycin-bacitracin-polymyxin Oint Commonly known as: NEOSPORIN Apply 1 Application topically 2 (two) times daily.   ondansetron 4 MG disintegrating tablet Commonly known as: ZOFRAN-ODT Take 1 tablet (4 mg total) by mouth every 8 (eight) hours as needed for nausea or vomiting.   pantoprazole 40 MG tablet Commonly known as: PROTONIX TAKE 1 TABLET BY MOUTH DAILY What changed:  how to take this when to take this   prazosin 1 MG capsule Commonly known as: MINIPRESS Take 1 mg by mouth at bedtime.   pregabalin 50 MG capsule Commonly known as: LYRICA Take 50 mg by mouth 3 (three) times daily.   QUEtiapine 25 MG tablet Commonly known as: SEROQUEL Take 0.5 tablets (12.5 mg total) by mouth 2 (two) times daily.         Consultations: PCCM Psychiatry  Procedures/Studies:  [name] EKG SITE RITE  Result Date: 12/28/2021 If Site Rite image not attached, placement could not be confirmed due to current cardiac rhythm.  DG Chest 2 View  Result Date: 12/12/2021 CLINICAL DATA:  Chest pain EXAM: CHEST - 2 VIEW COMPARISON:  Chest x-ray dated November 29, 2021 FINDINGS: Cardiac and mediastinal contours are within normal limits. Low lung volumes.  Lungs are clear. No effusion or pneumothorax. IMPRESSION: No active cardiopulmonary disease. Electronically Signed   By: Yetta Glassman M.D.   On: 12/12/2021 19:57     Subjective: -alert, doesn't engage much in conversation  Discharge Exam: BP 112/74   Pulse (!) 115   Temp 98.4 F (36.9 C) (Oral)   Resp 18   Ht '5\' 4"'$  (1.626 m)   Wt 96.1 kg   SpO2 99%   BMI 36.37 kg/m   Constitutional: NAD Eyes: lids and conjunctivae normal, no scleral icterus ENMT: mmm Neck: normal, supple   The results of significant diagnostics from this hospitalization (including imaging, microbiology, ancillary and laboratory) are listed below for reference.     Microbiology: Recent Results (from the past 240 hour(s))  Resp Panel by RT-PCR (Flu A&B, Covid) Anterior Nasal Swab     Status: None   Collection Time: 12/28/21 12:50 AM   Specimen: Anterior Nasal Swab  Result Value Ref Range Status   SARS Coronavirus 2 by RT PCR NEGATIVE NEGATIVE Final    Comment: (NOTE) SARS-CoV-2 target nucleic  acids are NOT DETECTED.  The SARS-CoV-2 RNA is generally detectable in upper respiratory specimens during the acute phase of infection. The lowest concentration of SARS-CoV-2 viral copies this assay can detect is 138 copies/mL. A negative result does not preclude SARS-Cov-2 infection and should not be used as the sole basis for treatment or other patient management decisions. A negative result may occur with  improper specimen collection/handling, submission of specimen other than nasopharyngeal swab, presence of viral mutation(s) within the areas targeted by this assay, and inadequate number of viral copies(<138 copies/mL). A negative result must be combined with clinical observations, patient history, and epidemiological information. The expected result is Negative.  Fact Sheet for Patients:  EntrepreneurPulse.com.au  Fact Sheet for Healthcare Providers:   IncredibleEmployment.be  This test is no t yet approved or cleared by the [place] FDA and  has been authorized for detection and/or diagnosis of SARS-CoV-2 by FDA under an Emergency Use Authorization (EUA). This EUA will remain  in effect (meaning this test can be used) for the duration of the COVID-19 declaration under Section 564(b)(1) of the Act, 21 U.S.C.section 360bbb-3(b)(1), unless the authorization is terminated  or revoked sooner.       Influenza A by PCR NEGATIVE NEGATIVE Final   Influenza B by PCR NEGATIVE NEGATIVE Final    Comment: (NOTE) The Xpert Xpress SARS-CoV-2/FLU/RSV plus assay is intended as an aid in the diagnosis of influenza from Nasopharyngeal swab specimens and should not be used as a sole basis for treatment. Nasal washings and aspirates are unacceptable for Xpert Xpress SARS-CoV-2/FLU/RSV testing.  Fact Sheet for Patients: EntrepreneurPulse.com.au  Fact Sheet for Healthcare Providers: IncredibleEmployment.be  This test is not yet approved or cleared by the [place] FDA and has been authorized for detection and/or diagnosis of SARS-CoV-2 by FDA under an Emergency Use Authorization (EUA). This EUA will remain in effect (meaning this test can be used) for the duration of the COVID-19 declaration under Section 564(b)(1) of the Act, 21 U.S.C. section 360bbb-3(b)(1), unless the authorization is terminated or revoked.  Performed at [facility], [place] [address] [place], [place] [address]   MRSA Next Gen by PCR, Nasal     Status: None   Collection Time: 12/28/21  4:35 AM   Specimen: Nasal Mucosa; Nasal Swab  Result Value Ref Range Status   MRSA by PCR Next Gen NOT DETECTED NOT DETECTED Final    Comment: (NOTE) The GeneXpert MRSA Assay (FDA approved for NASAL specimens only), is one component of a comprehensive MRSA colonization surveillance program. It is not intended to diagnose  MRSA infection nor to guide or monitor treatment for MRSA infections. Test performance is not FDA approved in patients less than 13 years old. Performed at [facility], [place] [address] [place], [place] [address]   SARS Coronavirus 2 by RT PCR (hospital order, performed in [facility] hospital lab) *cepheid single result test* Anterior Nasal Swab     Status: None   Collection Time: 12/30/21  8:57 AM   Specimen: Anterior Nasal Swab  Result Value Ref Range Status   SARS Coronavirus 2 by RT PCR NEGATIVE NEGATIVE Final    Comment: (NOTE) SARS-CoV-2 target nucleic acids are NOT DETECTED.  The SARS-CoV-2 RNA is generally detectable in upper and lower respiratory specimens during the acute phase of infection. The lowest concentration of SARS-CoV-2 viral copies this assay can detect is 250 copies / mL. A negative result does not preclude SARS-CoV-2 infection and should not be  used as the sole basis for treatment or other patient management decisions.  A negative result may occur with improper specimen collection / handling, submission of specimen other than nasopharyngeal swab, presence of viral mutation(s) within the areas targeted by this assay, and inadequate number of viral copies (<250 copies / mL). A negative result must be combined with clinical observations, patient history, and epidemiological information.  Fact Sheet for Patients:   https://www.patel.info/  Fact Sheet for Healthcare Providers: https://hall.com/  This test is not yet approved or  cleared by the [place] FDA and has been authorized for detection and/or diagnosis of SARS-CoV-2 by FDA under an Emergency Use Authorization (EUA).  This EUA will remain in effect (meaning this test can be used) for the duration of the COVID-19 declaration under Section 564(b)(1) of the Act, 21 U.S.C. section 360bbb-3(b)(1), unless the authorization is terminated or revoked  sooner.  Performed at [facility], [place] [address], [place], [place] [address]      Labs: Basic Metabolic Panel: Recent Labs  Lab 12/28/21 0017 12/28/21 0023 12/28/21 0329 12/30/21 0623 01/01/22 0317  NA 141 142 141 136 135  K 3.7 3.8 3.9 3.6 3.4*  CL 112* 110 110 105 104  CO2 21*  --  19* 18* 21*  GLUCOSE 93 91 103* 106* 106*  BUN <5* <3* <5* 10 10  CREATININE 0.70 0.60 0.73 0.70 0.70  CALCIUM 8.1*  --  8.1* 9.4 9.2  MG  --   --  1.8 1.9 2.0  PHOS  --   --  3.1 4.1 3.8   Liver Function Tests: Recent Labs  Lab 12/28/21 0017 12/28/21 0329 12/30/21 0623 01/01/22 0317  AST '15 17 19 18  '$ ALT '12 11 11 14  '$ ALKPHOS 56 57 65 57  BILITOT 0.3 0.3 0.3 0.7  PROT 5.6* 5.9* 7.7 7.5  ALBUMIN 3.2* 3.3* 4.3 4.0   CBC: Recent Labs  Lab 12/28/21 0017 12/28/21 0023 12/28/21 0329 12/30/21 0623 01/01/22 0317  WBC 6.8  --  8.5 10.9* 8.7  NEUTROABS 3.5  --   --   --   --   HGB 10.6* 10.5* 11.2* 13.1 12.1  HCT 34.7* 31.0* 36.0 40.1 37.6  MCV 83.4  --  83.5 80.0 79.7*  PLT 142*  --  262 254 271   CBG: No results for input(s): "GLUCAP" in the last 168 hours. Hgb A1c No results for input(s): "HGBA1C" in the last 72 hours. Lipid Profile No results for input(s): "CHOL", "HDL", "LDLCALC", "TRIG", "CHOLHDL", "LDLDIRECT" in the last 72 hours. Thyroid function studies No results for input(s): "TSH", "T4TOTAL", "T3FREE", "THYROIDAB" in the last 72 hours.  Invalid input(s): "FREET3" Urinalysis    Component Value Date/Time   COLORURINE YELLOW 12/28/2021 0006   APPEARANCEUR CLEAR 12/28/2021 0006   LABSPEC 1.006 12/28/2021 0006   PHURINE 8.0 12/28/2021 0006   GLUCOSEU NEGATIVE 12/28/2021 0006   HGBUR NEGATIVE 12/28/2021 0006   BILIRUBINUR NEGATIVE 12/28/2021 0006   BILIRUBINUR negative 06/14/2015 0930   KETONESUR NEGATIVE 12/28/2021 0006   PROTEINUR NEGATIVE 12/28/2021 0006   UROBILINOGEN 0.2 10/24/2015 1428   NITRITE NEGATIVE 12/28/2021 0006   LEUKOCYTESUR SMALL (A)  12/28/2021 0006    FURTHER DISCHARGE INSTRUCTIONS:   Get Medicines reviewed and adjusted: Please take all your medications with you for your next visit with your Primary MD   Laboratory/radiological data: Please request your Primary MD to go over all hospital tests and procedure/radiological results at the follow up, please ask your  Primary MD to get all Hospital records sent to his/her office.   In some cases, they will be blood work, cultures and biopsy results pending at the time of your discharge. Please request that your primary care M.D. goes through all the records of your hospital data and follows up on these results.   Also Note the following: If you experience worsening of your admission symptoms, develop shortness of breath, life threatening emergency, suicidal or homicidal thoughts you must seek medical attention immediately by calling 911 or calling your MD immediately  if symptoms less severe.   You must read complete instructions/literature along with all the possible adverse reactions/side effects for all the Medicines you take and that have been prescribed to you. Take any new Medicines after you have completely understood and accpet all the possible adverse reactions/side effects.    Do not drive when taking Pain medications or sleeping medications (Benzodaizepines)   Do not take more than prescribed Pain, Sleep and Anxiety Medications. It is not advisable to combine anxiety,sleep and pain medications without talking with your primary care practitioner   Special Instructions: If you have smoked or chewed Tobacco  in the last 2 yrs please stop smoking, stop any regular Alcohol  and or any Recreational drug use.   Wear Seat belts while driving.   Please note: You were cared for by a hospitalist during your hospital stay. Once you are discharged, your primary care physician will handle any further medical issues. Please note that NO REFILLS for any discharge medications will  be authorized once you are discharged, as it is imperative that you return to your primary care physician (or establish a relationship with a primary care physician if you do not have one) for your post hospital discharge needs so that they can reassess your need for medications and monitor your lab values.  Time coordinating discharge: 10 minutes  SIGNED:  Marzetta Board, MD, PhD 01/01/2022, 11:38 AM

## 2022-01-01 NOTE — Progress Notes (Signed)
Prn vistaril 25 mg PO, Zyprexa 5 mg given for anxiety and agitation with Trazodone for sleep. Patient stating she feels frustrated with her Parents whore tore her degree. Would like a better out Patient program. Endorses Passive SI with no plan. Support and encouragement provided.    Q 15 minutes safety checks ongoing. Patient remains safe.

## 2022-01-01 NOTE — Progress Notes (Signed)
Pt admitted under IVC to [facility] adult unit.  Pt with prior admissions.  Pt currently admitted after intentionally OD on various medications.  Pt stated her doctor recently lowered the doses of some of her medications and she was unable to sleep and verbalized this led to her overdose.  During admission pt denied SI, HI, and AVH.  Denied pain.  Pt anxious during admission process. Pt safe on unit.

## 2022-01-02 ENCOUNTER — Other Ambulatory Visit (HOSPITAL_COMMUNITY): Payer: Self-pay

## 2022-01-02 DIAGNOSIS — F332 Major depressive disorder, recurrent severe without psychotic features: Secondary | ICD-10-CM

## 2022-01-02 DIAGNOSIS — F4310 Post-traumatic stress disorder, unspecified: Secondary | ICD-10-CM

## 2022-01-02 DIAGNOSIS — F250 Schizoaffective disorder, bipolar type: Principal | ICD-10-CM

## 2022-01-02 MED ORDER — PROPRANOLOL HCL 10 MG PO TABS
10.0000 mg | ORAL_TABLET | Freq: Once | ORAL | Status: AC
  Administered 2022-01-02: 10 mg via ORAL
  Filled 2022-01-02 (×2): qty 1

## 2022-01-02 MED ORDER — SECUADO 3.8 MG/24HR TD PT24
1.0000 | MEDICATED_PATCH | Freq: Every day | TRANSDERMAL | 0 refills | Status: DC
  Filled 2022-01-02: qty 30, 30d supply, fill #0

## 2022-01-02 MED ORDER — ASENAPINE 3.8 MG/24HR TD PT24
3.8000 mg | MEDICATED_PATCH | Freq: Every day | TRANSDERMAL | Status: DC
  Filled 2022-01-02: qty 1

## 2022-01-02 MED ORDER — QUETIAPINE 12.5 MG HALF TABLET
12.5000 mg | ORAL_TABLET | Freq: Every day | ORAL | Status: AC
  Administered 2022-01-02: 12.5 mg via ORAL
  Filled 2022-01-02: qty 1

## 2022-01-02 MED ORDER — PROPRANOLOL HCL 10 MG PO TABS
5.0000 mg | ORAL_TABLET | Freq: Two times a day (BID) | ORAL | Status: DC
  Administered 2022-01-02 – 2022-01-03 (×2): 5 mg via ORAL
  Filled 2022-01-02 (×6): qty 0.5
  Filled 2022-01-02: qty 1

## 2022-01-02 MED ORDER — ASENAPINE 3.8 MG/24HR TD PT24
3.8000 mg | MEDICATED_PATCH | Freq: Every day | TRANSDERMAL | Status: DC
  Administered 2022-01-03 – 2022-01-10 (×8): 3.8 mg via TRANSDERMAL

## 2022-01-02 NOTE — Plan of Care (Signed)
  Problem: Coping: Goal: Ability to verbalize frustrations and anger appropriately will improve Outcome: Progressing Goal: Ability to demonstrate self-control will improve Outcome: Progressing   Problem: Safety: Goal: Periods of time without injury will increase Outcome: Progressing
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
## 2022-01-02 NOTE — [facility] Group Notes (Signed)
The focus of this group is to help patients review their daily goal of treatment and discuss progress on daily workbooks. Pt was attentive and appropriate during tonights wrap up group. Pt was able to share that she is currently having a lot of ups and downs due to it being the holidays. Pt was able to share current family issues and death of love one. Pt shared that she was able to speak with doctor and talk about medications.

## 2022-01-02 NOTE — Progress Notes (Signed)
Patient came to the Nurse stated she fell in her room getting out of bed. Patient reports " I think I got up too fast" Patient is alert and orient did not hit her head. Patient C/O left ankle pain 5/10 no bruising or redness noted. Patient ambulated to the Nurse station with steady gait. Ice pack given for pain.   Support and encouragement provided. Patient seen by Provider no new order. To continue to monitor. Patient educated on fall prevention reminders.

## 2022-01-02 NOTE — Progress Notes (Signed)
Pt rates depression 5/10 and anxiety 8/10. Pt reports a Fair  appetite, and No  physical problems. Pt denies SI/HI/AVH and verbally contracts for safety.Patient reported being paranoid during this shift of parents, patient stated parents continues to talk about her and she is afraid to sleep etc mind racing about all the wrong she has done but she states she knows she can do better  Provided support and encouragement. Pt safe on the unit. Q 15 minute safety checks continued.

## 2022-01-02 NOTE — Progress Notes (Signed)
All fall protocol procedures in place and Patient educated.

## 2022-01-03 ENCOUNTER — Other Ambulatory Visit (HOSPITAL_COMMUNITY): Payer: Self-pay

## 2022-01-03 ENCOUNTER — Encounter (HOSPITAL_COMMUNITY): Payer: Self-pay

## 2022-01-03 MED ORDER — OLANZAPINE 10 MG IM SOLR: 10.0000 mg | Freq: Two times a day (BID) | INTRAMUSCULAR | Status: DC | PRN

## 2022-01-03 MED ORDER — LORAZEPAM 2 MG/ML IJ SOLN
2.0000 mg | Freq: Four times a day (QID) | INTRAMUSCULAR | Status: DC | PRN
  Filled 2022-01-03: qty 1

## 2022-01-03 MED ORDER — OLANZAPINE 10 MG IM SOLR
10.0000 mg | Freq: Once | INTRAMUSCULAR | Status: AC
  Administered 2022-01-03: 10 mg via INTRAMUSCULAR
  Filled 2022-01-03: qty 10

## 2022-01-03 MED ORDER — METOPROLOL SUCCINATE ER 25 MG PO TB24
12.5000 mg | ORAL_TABLET | Freq: Every day | ORAL | Status: DC
  Administered 2022-01-04 – 2022-01-10 (×7): 12.5 mg via ORAL
  Filled 2022-01-03 (×9): qty 1

## 2022-01-03 MED ORDER — LORAZEPAM 1 MG PO TABS: 2.0000 mg | ORAL_TABLET | Freq: Four times a day (QID) | ORAL | Status: DC | PRN

## 2022-01-03 MED ORDER — TRAZODONE HCL 100 MG PO TABS
100.0000 mg | ORAL_TABLET | Freq: Every evening | ORAL | Status: DC | PRN
  Administered 2022-01-03 – 2022-01-07 (×5): 100 mg via ORAL
  Filled 2022-01-03 (×5): qty 1

## 2022-01-03 MED ORDER — QUETIAPINE FUMARATE 25 MG PO TABS
25.0000 mg | ORAL_TABLET | Freq: Three times a day (TID) | ORAL | Status: DC | PRN
  Administered 2022-01-03: 25 mg via ORAL

## 2022-01-03 MED ORDER — OLANZAPINE 10 MG IM SOLR
5.0000 mg | Freq: Two times a day (BID) | INTRAMUSCULAR | Status: DC | PRN
  Filled 2022-01-03: qty 10

## 2022-01-03 MED ORDER — QUETIAPINE FUMARATE 25 MG PO TABS
25.0000 mg | ORAL_TABLET | Freq: Two times a day (BID) | ORAL | Status: DC
  Filled 2022-01-03 (×2): qty 1

## 2022-01-03 MED ORDER — OLANZAPINE 5 MG PO TBDP
5.0000 mg | ORAL_TABLET | Freq: Two times a day (BID) | ORAL | Status: DC
  Administered 2022-01-04 – 2022-01-05 (×3): 5 mg via ORAL
  Filled 2022-01-03 (×7): qty 1

## 2022-01-03 MED ORDER — DIPHENHYDRAMINE HCL 50 MG/ML IJ SOLN
INTRAMUSCULAR | Status: AC
  Filled 2022-01-03: qty 1

## 2022-01-03 MED ORDER — ATENOLOL 12.5 MG HALF TABLET
12.5000 mg | ORAL_TABLET | Freq: Every day | ORAL | Status: DC
  Filled 2022-01-03: qty 1
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
## 2022-01-03 NOTE — Progress Notes (Addendum)
[facility] MD Progress Note  01/03/2022 3:29 PM [name]  MRN:  [id number] Principal Problem: Schizoaffective disorder, bipolar type ([place]) Diagnosis: Principal Problem:   Schizoaffective disorder, bipolar type ([place]) Active Problems:   Borderline personality disorder ([place])   PTSD (post-traumatic stress disorder)   Asthma   Tachycardia   GAD (generalized anxiety disorder)   Self-injurious behavior   Fibromyalgia  Reason For Admission: [name] is a 31 yo Caucasian female with prior mental health health diagnoses of borderline personality d/o & Schizoaffective d/o who was taken to the [facility] by [facility] after she called the crises hotline number to report that she had overdosed on multiple medications in an attempt to end her life. As per ED assessment, "she took 6 '40mg'$  Zofran pills, 6 '25mg'$  Atenolol pills, 6 '10mg'$  Cyclobenzapr pills, 18 '5mg'$  escitalopram pills, 18 '10mg'$  Hydroxyz pills, 6 '40mg'$  pantroprazole pills, 6 '1mg'$  Prazosin hcl pills, 18 '50mg'$  pregabalin pills, 3 '25mg'$  Quetiapine pills, and 6 1.'5mg'$  vraylar pills."  Patient was involuntarily committed and transferred to this behavioral health Hospital for treatment and stabilization of her mood.   24 hr chart review: V/S with HR elevated earlier today morning at 117. BP elevated earlier today morning also at 140/96. Nursing asked to recheck V/S. Pt compliant with medications over the past 24 hrs, required agitation protocol medications; Seroquel 25 mg given today afternoon for agitation, hydroxyzine 25 mg given today afternoon for anxiety and Ativan 1 mg given today afternoon for anxiety. Pt also required Trazodone 50 mg last night for insomnia. Pt with some paranoia & racing thoughts last night as per nursing documentation.  Patient assessment note: Pt presents today with an irritable, angry and anxious mood. She reports feeling down and depressed, is tearful and presents with paranoia, delusional thinking and thoughts are illogical. She talks  about other patients on the unit and staff "talking about my child". She feels as though other people are talking about her, and occasionally hits on the walls in her room, and is tearful through entire assessment. She states that her other child was taken away, and now "this one will be taken away", while pointing to her stomach. Pt educated that she is not pregnant as per blood test completed prior to this hospitalization, but is convinced that she is pregnant. Seroquel 25 mg given to pt for agitation.  She denies SI/HI/AVH.  Pt reports that her sleep quality last night was poor because she was thinking about her children all night. She reports a good appetite and states that she ate breakfast. She denies being any physical distress, denies any medication related side effects.  Saphris patch has been applied on pt to help with mood and psychosis, as goal remains to limit PO medications at this time due to concerns for overdosing on them. We are continuing medications as listed below.  Total Time spent with patient: 45 minutes  Past Psychiatric History: Schizoaffective d/o  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 fragile X syndrome    Chronic constipation    Depression    Drug-seeking behavior    Essential tremor    Headache    Overdose of acetaminophen 07/2017   and other meds   Personality disorder ([place])    Schizo-affective psychosis ([place])    Schizoaffective disorder, bipolar type ([place])  Seizures ([place])    Last seizure December 2017   Sleep apnea     Past Surgical History:  Procedure Laterality Date   MOUTH SURGERY  2009 or 2010   Family History:  Family History  Problem Relation Age of Onset   Mental illness Father    Asthma Father    PDD Brother    Seizures Brother    Family Psychiatric  History: As above Social History:  Social History   Substance and  Sexual Activity  Alcohol Use No   Alcohol/week: 1.0 standard drink of alcohol   Types: 1 Standard drinks or equivalent per week   Comment: denies at this time     Social History   Substance and Sexual Activity  Drug Use No   Comment: History of cocaine use at age 76 for 4 months    Social History   Socioeconomic History   Marital status: Widowed    Spouse name: Not on file   Number of children: 0   Years of education: Not on file   Highest education level: Not on file  Occupational History   Occupation: disability  Tobacco Use   Smoking status: Former    Packs/day: 0.00    Types: Cigarettes   Smokeless tobacco: Never   Tobacco comments:    Smoked for 2  years age [age]  Vaping Use   Vaping Use: Never used  Substance and Sexual Activity   Alcohol use: No    Alcohol/week: 1.0 standard drink of alcohol    Types: 1 Standard drinks or equivalent per week    Comment: denies at this time   Drug use: No    Comment: History of cocaine use at age 40 for 4 months   Sexual activity: Not Currently    Birth control/protection: None  Other Topics Concern   Not on file  Social History Narrative   Marital status: Widowed      Children: daughter      Lives: with boyfriend, in two story home      Employment:  Disability      Tobacco: quit smoking; smoked for two years.      Alcohol ;none      Drugs: none   Has not traveled outside of the country.   Right handed         Social Determinants of Health   Financial Resource Strain: Not on file  Food Insecurity: No Food Insecurity (01/01/2022)   Hunger Vital Sign    Worried About Running Out of Food in the Last Year: Never true    Ran Out of Food in the Last Year: Never true  Recent Concern: Food Insecurity - Food Insecurity Present (12/28/2021)   Hunger Vital Sign    Worried About Charity fundraiser in the Last Year: Often true    Ran Out of Food in the Last Year: Often true  Transportation Needs: Unmet Transportation Needs  (01/01/2022)   PRAPARE - Hydrologist (Medical): No    Lack of Transportation (Non-Medical): Yes  Physical Activity: Not on file  Stress: Not on file  Social Connections: Not on file   Additional Social History:     Sleep: Poor  Appetite:  Good  Current Medications: Current Facility-Administered Medications  Medication Dose Route Frequency Provider Last Rate Last Admin   albuterol (VENTOLIN HFA) 108 (90 Base) MCG/ACT inhaler 2 puff  2 puff Inhalation Q6H PRN Janine Limbo, MD  alum & mag hydroxide-simeth (MAALOX/MYLANTA) 200-200-20 MG/5ML suspension 30 mL  30 mL Oral Q4H PRN Corky Sox, MD       Asenapine PT24 3.8 mg  3.8 mg Transdermal Daily Massengill, Ovid Curd, MD   3.8 mg at 01/03/22 1346   fluticasone furoate-vilanterol (BREO ELLIPTA) 200-25 MCG/ACT 1 puff  1 puff Inhalation Daily Massengill, Ovid Curd, MD   1 puff at 01/03/22 0819   hydrOXYzine (ATARAX) tablet 25 mg  25 mg Oral TID PRN Corky Sox, MD   25 mg at 01/03/22 1105   LORazepam (ATIVAN) tablet 2 mg  2 mg Oral Q6H PRN Nicholes Rough, NP       Or   LORazepam (ATIVAN) injection 2 mg  2 mg Intramuscular Q6H PRN Nicholes Rough, NP       magnesium hydroxide (MILK OF MAGNESIA) suspension 30 mL  30 mL Oral Daily PRN Corky Sox, MD       Derrill Memo ON 01/04/2022] metoprolol succinate (TOPROL-XL) 24 hr tablet 12.5 mg  12.5 mg Oral Daily Massengill, Nathan, MD       OLANZapine (ZYPREXA) injection 5 mg  5 mg Intramuscular BID PRN Massengill, Ovid Curd, MD       QUEtiapine (SEROQUEL) tablet 25 mg  25 mg Oral TID PRN Nicholes Rough, NP   25 mg at 01/03/22 1347   QUEtiapine (SEROQUEL) tablet 25 mg  25 mg Oral Q12H Massengill, Ovid Curd, MD       traZODone (DESYREL) tablet 100 mg  100 mg Oral QHS PRN Nicholes Rough, NP        Lab Results: No results found for this or any previous visit (from the past 62 hour(s)).  Blood Alcohol level:  Lab Results  Component Value Date   ETH <10 10/29/2021    ETH <10 71/24/5809    Metabolic Disorder Labs: Lab Results  Component Value Date   HGBA1C 5.0 10/07/2021   MPG 96.8 10/07/2021   MPG 96.8 09/02/2021   Lab Results  Component Value Date   PROLACTIN 66.2 (H) 03/02/2021   PROLACTIN 66.1 (H) 02/04/2020   Lab Results  Component Value Date   CHOL 175 12/10/2021   TRIG 145 12/10/2021   HDL 44 12/10/2021   CHOLHDL 4.0 12/10/2021   VLDL 29 12/10/2021   LDLCALC 102 (H) 12/10/2021   LDLCALC 74 10/07/2021   Physical Findings: AIMS: 0 CIWA:  n/a COWS:  n/a   Musculoskeletal: Strength & Muscle Tone: within normal limits Gait & Station: normal Patient leans: N/A  Psychiatric Specialty Exam:  Presentation  General Appearance:  Disheveled  Eye Contact: Fair  Speech: Clear and Coherent  Speech Volume: Increased  Handedness: Right   Mood and Affect  Mood: Angry; Anxious; Depressed; Irritable  Affect: Congruent   Thought Process  Thought Processes: Coherent  Descriptions of Associations:Intact  Orientation:Full (Time, Place and Person)  Thought Content:Illogical  History of Schizophrenia/Schizoaffective disorder:Yes  Duration of Psychotic Symptoms:Greater than six months  Hallucinations:Hallucinations: Auditory Description of Auditory Hallucinations: voices which are demeaning and stating she is ugly  Ideas of Reference:Paranoia; Delusions; Percusatory  Suicidal Thoughts:Suicidal Thoughts: No SI Active Intent and/or Plan: Without Intent  Homicidal Thoughts:Homicidal Thoughts: No  Sensorium  Memory: Immediate Good  Judgment: Poor  Insight: Poor   Executive Functions  Concentration: Poor  Attention Span: Poor  Recall: Poor  Fund of Knowledge: Fair  Language: Fair  Psychomotor Activity  Psychomotor Activity: Psychomotor Activity: Normal  Assets  Assets: Resilience  Sleep  Sleep: Sleep: Poor  Physical Exam: Physical Exam Constitutional:  Appearance: Normal  appearance.  HENT:     Head: Normocephalic.     Nose: Nose normal. No congestion or rhinorrhea.  Eyes:     Pupils: Pupils are equal, round, and reactive to light.  Pulmonary:     Effort: Pulmonary effort is normal.  Musculoskeletal:        General: Normal range of motion.     Cervical back: Normal range of motion.  Neurological:     General: No focal deficit present.     Mental Status: She is alert.    Review of Systems  Constitutional:  Negative for fever.  HENT:  Negative for hearing loss.   Eyes: Negative.   Respiratory: Negative.    Cardiovascular:  Positive for palpitations.  Gastrointestinal:  Negative for heartburn.  Musculoskeletal: Negative.   Skin:  Negative for rash.  Neurological: Negative.   Psychiatric/Behavioral:  Positive for depression and hallucinations (paranoia). Negative for memory loss, substance abuse and suicidal ideas. The patient is nervous/anxious and has insomnia.    Blood pressure (!) 140/96, pulse (!) 117, temperature 98.4 F (36.9 C), temperature source Oral, resp. rate 18, height '5\' 4"'$  (1.626 m), weight 88.9 kg, SpO2 98 %. Body mass index is 33.64 kg/m.  Treatment Plan Summary: Daily contact with patient to assess and evaluate symptoms and progress in treatment and Medication management   Observation Level/Precautions:  15 minute checks  Laboratory:  Labs reviewed   Psychotherapy:  Unit Group sessions  Medications:  See [facility]  Consultations:  To be determined   Discharge Concerns:  Safety, medication compliance, mood stability  Estimated LOS: 5-7 days  Other:  N/A    Labs Reviewed on 01/02/2022: CMP& CBC reviewed. Beta hcg negative, Tox screen negative, UA with small leukocytes and rare bacteria. TSH WNL, Lipid panel WNL, HA1C from 10/07/21 WNL. QTC from 11/27 with QTC slightly prolonged at 456. Will recheck in a few days.    PLAN Safety and Monitoring: Involuntary admission to inpatient psychiatric unit for safety, stabilization and  treatment Daily contact with patient to assess and evaluate symptoms and progress in treatment Patient's case to be discussed in multi-disciplinary team meeting Observation Level : q15 minute checks Vital signs: q12 hours Precautions: Safety No Roommate d/t psychosis and aggressive behaviors   Long Term Goal(s): Improvement in symptoms so as ready for discharge   Short Term Goals: Ability to identify changes in lifestyle to reduce recurrence of condition will improve, Ability to verbalize feelings will improve, Ability to disclose and discuss suicidal ideas, Ability to demonstrate self-control will improve, Ability to identify and develop effective coping behaviors will improve, and Compliance with prescribed medications will improve   Physician Treatment Plan for Secondary Diagnosis:  Principal Problem:   Schizoaffective disorder, bipolar type ([place]) Active Problems:   Borderline personality disorder (H[facility])   PTSD (post-traumatic stress disorder)   Asthma   Tachycardia   GAD (generalized anxiety disorder)   Self-injurious behavior   Fibromyalgia   Medications -Continue Saphris patch 3.8 mg for psychosis & mood (patch is a safe choice for pt currently d/t h/o multiple overdoses on PO medications. QTC with QTC-456, will recheck EKG in a few days) -Start Seroquel 25 mg BID for psychosis -Continue Hydroxyzine 25 mg TID PRN for anxiety -Increase Trazodone to 100 mg nightly PRN for sleep -Start Seroquel 25 mg TID PRN for agitation -Discontinue Inderal 5 mg Q 12 H for tachycardia -Start Toprol XL 12.5 mg daily for tachycardia on 01/04/22 -Continue Breo Ellipta inhaler daily for  asthma -Continue Albuterol PRN for SOB/Wheezing -Start Ativan 2 mg  PO/IM for agitation PRN for agitation -Start Zyprexa 5 mg IM PRN BID for agiktation   Other PRNS -Continue Tylenol 650 mg every 6 hours PRN for mild pain -Continue Maalox 30 mg every 4 hrs PRN for indigestion -Continue Milk of Magnesia as  needed every 6 hrs for constipation   Discharge Planning: Social work and case management to assist with discharge planning and identification of hospital follow-up needs prior to discharge Estimated LOS: 5-7 days Discharge Concerns: Need to establish a safety plan; Medication compliance and effectiveness Discharge Goals: Return home with outpatient referrals for mental health follow-up including medication management/psychotherapy  Nicholes Rough, NP 01/03/2022, 3:29 PM

## 2022-01-03 NOTE — Progress Notes (Signed)
PO hydroxyzine 25 mg and 1 mg of ativan administered to patient for agitation at 1105. Patient tolerated medication well with no side effect noted. Staff will continue to provide support to patient.

## 2022-01-03 NOTE — Plan of Care (Signed)
  Problem: Safety: Goal: Periods of time without injury will increase Outcome: Progressing   Problem: Medication: Goal: Compliance with prescribed medication regimen will improve Outcome: Progressing   

## 2022-01-03 NOTE — Group Note (Signed)
Date:  01/03/2022 Time:  12:03 PM  Group Topic/Focus:  Goals Group:   The focus of this group is to help patients establish daily goals to achieve during treatment and discuss how the patient can incorporate goal setting into their daily lives to aide in recovery.    Participation Level:  None  Participation Quality:      Affect:  Tearful  Cognitive:  Lacking  Insight: Limited  Engagement in Group:  Poor  Modes of Intervention:  Discussion  Additional Comments:     Jerrye Beavers 01/03/2022, 12:03 PM
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
## 2022-01-03 NOTE — BH IP Treatment Plan (Signed)
Interdisciplinary Treatment and Diagnostic Plan Update  01/03/2022 Time of Session: 9:55am  [name] MRN: [id number]  Principal Diagnosis: Schizoaffective disorder, bipolar type ([place])  Secondary Diagnoses: Principal Problem:   Schizoaffective disorder, bipolar type ([place]) Active Problems:   Borderline personality disorder ([place])   PTSD (post-traumatic stress disorder)   Asthma   Tachycardia   GAD (generalized anxiety disorder)   Self-injurious behavior   Fibromyalgia   Current Medications:  Current Facility-Administered Medications  Medication Dose Route Frequency Provider Last Rate Last Admin   albuterol (VENTOLIN HFA) 108 (90 Base) MCG/ACT inhaler 2 puff  2 puff Inhalation Q6H PRN Massengill, Ovid Curd, MD       alum & mag hydroxide-simeth (MAALOX/MYLANTA) 200-200-20 MG/5ML suspension 30 mL  30 mL Oral Q4H PRN Corky Sox, MD       Asenapine PT24 3.8 mg  3.8 mg Transdermal Daily Massengill, Nathan, MD       fluticasone furoate-vilanterol (BREO ELLIPTA) 200-25 MCG/ACT 1 puff  1 puff Inhalation Daily Massengill, Ovid Curd, MD   1 puff at 01/03/22 0819   hydrOXYzine (ATARAX) tablet 25 mg  25 mg Oral TID PRN Corky Sox, MD   25 mg at 01/03/22 1105   magnesium hydroxide (MILK OF MAGNESIA) suspension 30 mL  30 mL Oral Daily PRN Corky Sox, MD       propranolol (INDERAL) tablet 5 mg  5 mg Oral Q12H Massengill, Nathan, MD   5 mg at 01/03/22 0818   QUEtiapine (SEROQUEL) tablet 25 mg  25 mg Oral TID PRN Nicholes Rough, NP       traZODone (DESYREL) tablet 50 mg  50 mg Oral QHS PRN Corky Sox, MD   50 mg at 01/01/22 2054   PTA Medications: Medications Prior to Admission  Medication Sig Dispense Refill Last Dose   albuterol (VENTOLIN HFA) 108 (90 Base) MCG/ACT inhaler Inhale 2 puffs into the lungs every 6 (six) hours as needed for wheezing or shortness of breath.      budesonide-formoterol (SYMBICORT) 160-4.5 MCG/ACT inhaler Inhale 2 puffs into the lungs 2 (two)  times daily.       Patient Stressors:    Patient Strengths:    Treatment Modalities: Medication Management, Group therapy, Case management,  1 to 1 session with clinician, Psychoeducation, Recreational therapy.   Physician Treatment Plan for Primary Diagnosis: Schizoaffective disorder, bipolar type ([place]) Long Term Goal(s): Improvement in symptoms so as ready for discharge   Short Term Goals: Ability to identify changes in lifestyle to reduce recurrence of condition will improve Ability to verbalize feelings will improve Ability to disclose and discuss suicidal ideas Ability to demonstrate self-control will improve Ability to identify and develop effective coping behaviors will improve Compliance with prescribed medications will improve  Medication Management: Evaluate patient's response, side effects, and tolerance of medication regimen.  Therapeutic Interventions: 1 to 1 sessions, Unit Group sessions and Medication administration.  Evaluation of Outcomes: Not Met  Physician Treatment Plan for Secondary Diagnosis: Principal Problem:   Schizoaffective disorder, bipolar type ([place]) Active Problems:   Borderline personality disorder (HCC)   PTSD (post-traumatic stress disorder)   Asthma   Tachycardia   GAD (generalized anxiety disorder)   Self-injurious behavior   Fibromyalgia  Long Term Goal(s): Improvement in symptoms so as ready for discharge   Short Term Goals: Ability to identify changes in lifestyle to reduce recurrence of condition will improve Ability to verbalize feelings will improve Ability to disclose and discuss suicidal ideas Ability to demonstrate self-control will improve Ability to  identify and develop effective coping behaviors will improve Compliance with prescribed medications will improve     Medication Management: Evaluate patient's response, side effects, and tolerance of medication regimen.  Therapeutic Interventions: 1 to 1 sessions, Unit Group  sessions and Medication administration.  Evaluation of Outcomes: Not Met   RN Treatment Plan for Primary Diagnosis: Schizoaffective disorder, bipolar type ([place]) Long Term Goal(s): Knowledge of disease and therapeutic regimen to maintain health will improve  Short Term Goals: Ability to remain free from injury will improve, Ability to participate in decision making will improve, Ability to verbalize feelings will improve, Ability to disclose and discuss suicidal ideas, and Ability to identify and develop effective coping behaviors will improve  Medication Management: RN will administer medications as ordered by provider, will assess and evaluate patient's response and provide education to patient for prescribed medication. RN will report any adverse and/or side effects to prescribing provider.  Therapeutic Interventions: 1 on 1 counseling sessions, Psychoeducation, Medication administration, Evaluate responses to treatment, Monitor vital signs and CBGs as ordered, Perform/monitor CIWA, COWS, AIMS and Fall Risk screenings as ordered, Perform wound care treatments as ordered.  Evaluation of Outcomes: Not Met   LCSW Treatment Plan for Primary Diagnosis: Schizoaffective disorder, bipolar type ([place]) Long Term Goal(s): Safe transition to appropriate next level of care at discharge, Engage patient in therapeutic group addressing interpersonal concerns.  Short Term Goals: Engage patient in aftercare planning with referrals and resources, Increase social support, Increase emotional regulation, Facilitate acceptance of mental health diagnosis and concerns, Identify triggers associated with mental health/substance abuse issues, and Increase skills for wellness and recovery  Therapeutic Interventions: Assess for all discharge needs, 1 to 1 time with Social worker, Explore available resources and support systems, Assess for adequacy in community support network, Educate family and significant other(s) on  suicide prevention, Complete Psychosocial Assessment, Interpersonal group therapy.  Evaluation of Outcomes: Not Met   Progress in Treatment: Attending groups: No. Participating in groups: No. Taking medication as prescribed: Yes. Toleration medication: Yes. Family/Significant other contact made: Yes, individual(s) contacted:  If consents are provided  Patient understands diagnosis: No. Discussing patient identified problems/goals with staff: Yes. Medical problems stabilized or resolved: Yes. Denies suicidal/homicidal ideation: Yes. Issues/concerns per patient self-inventory: No.   New problem(s) identified: No, Describe:  None   New Short Term/Long Term Goal(s): medication stabilization, elimination of SI thoughts, development of comprehensive mental wellness plan.   Patient Goals: "To go to [facility] and look for housing"   Discharge Plan or Barriers: Patient recently admitted. CSW will continue to follow and assess for appropriate referrals and possible discharge planning.   Reason for Continuation of Hospitalization: Anxiety Depression Medication stabilization Suicidal ideation  Estimated Length of Stay: 3 to 7 days   Last 3 [place] Suicide Severity Risk Score: [place] Admission (Current) from 01/01/2022 in [facility] ED to Hosp-Admission (Discharged) from 12/27/2021 in [place] [facility] ED from 12/12/2021 in [place] High Risk High Risk No Risk       Last PHQ 2/9 Scores:    06/27/2021    3:18 AM 05/01/2021    2:16 AM 02/03/2020    2:24 AM  Depression screen PHQ 2/9  Decreased Interest _0 Down, Depressed, Hopeless _1 PHQ - 2 Score _2 Altered sleeping 0 0 3  Tired, decreased energy _3 Change in appetite 0  1 3  Feeling bad or failure about yourself  _0 Trouble concentrating _1 Moving slowly or fidgety/restless _2 Suicidal thoughts _3 PHQ-9 Score _4 Difficult doing work/chores Somewhat difficult Extremely dIfficult     Scribe for Treatment Team: Carney Harder 01/03/2022 1:36 PM

## 2022-01-03 NOTE — Plan of Care (Signed)
Manual hold required for IM medication secondary to violent behaviors toward staff. Patient meets criteria for release and there are no identifiable or imminent safety threats identified at this time.   Problem: Safety: Goal: Violent Restraint(s) Outcome: Completed/Met
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
## 2022-01-03 NOTE — Progress Notes (Signed)
Adult Psychoeducational Group Note  Date:  01/03/2022 Time:  8:57 PM  Group Topic/Focus:  Wrap-Up Group:   The focus of this group is to help patients review their daily goal of treatment and discuss progress on daily workbooks.  Participation Level:  minimal  Participation Quality:  Appropriate  Affect:  Flat  Cognitive:  Appropriate  Insight: Appropriate  Engagement in Group:  Lacking  Modes of Intervention:  Discussion, Rapport Building, and Socialization  Additional Comments:   Pt attended and participated in the [place] group. Pt denied SI/HI/AVH. Pt rated her day 0/10 "Worst day it could be". Pt reported little progress toward her goal of staying focused on her recovery. Pt shared that taking baths and painting helps her cope and feel better.  [name] Kanner 01/03/2022, 8:57 PM

## 2022-01-03 NOTE — Group Note (Signed)
Recreation Therapy Group Note   Group Topic:Team Building  Group Date: 01/03/2022 Start Time: 0932 End Time: 0955 Facilitators: Maryclaire Stoecker-McCall, LRT,CTRS Location: [address] Dayroom   Goal Area(s) Addresses:  Patient will effectively work with peer towards shared goal.  Patient will identify skills used to make activity successful.  Patient will identify how skills used during activity can be used to reach post d/c goals.   Group Description: Landing Pad. In teams of 3-5, patients were given 12 plastic drinking straws and an equal length of masking tape. Using the materials provided, patients were asked to build a landing pad to catch a golf ball dropped from approximately 5 feet in the air. All materials were required to be used by the team in their design. LRT facilitated post-activity discussion.   Affect/Mood: N/A   Participation Level: Did not attend    Clinical Observations/Individualized Feedback:      Plan: Continue to engage patient in RT group sessions 2-3x/week.   Susy Placzek-McCall, LRT,CTRS 01/03/2022 11:37 AM

## 2022-01-04 DIAGNOSIS — F250 Schizoaffective disorder, bipolar type: Secondary | ICD-10-CM

## 2022-01-04 MED ORDER — OLANZAPINE 10 MG PO TBDP
10.0000 mg | ORAL_TABLET | Freq: Two times a day (BID) | ORAL | Status: DC | PRN
  Administered 2022-01-04 – 2022-01-06 (×3): 10 mg via ORAL
  Filled 2022-01-04 (×4): qty 1

## 2022-01-04 NOTE — Progress Notes (Signed)
Pt stated she was doing better, pt visible in the dayroom much of the evening    01/04/22 2000  Psych Admission Type (Psych Patients Only)  Admission Status Voluntary  Psychosocial Assessment  Patient Complaints None  Eye Contact Fair  Facial Expression Anxious;Angry  Affect Angry  Speech Tangential  Interaction Assertive  Motor Activity Slow  Appearance/Hygiene Unremarkable  Behavior Characteristics Anxious  Mood Preoccupied;Pleasant  Aggressive Behavior  Effect No apparent injury  Thought Process  Coherency Tangential  Content Blaming others  Delusions WDL  Perception Hallucinations  Hallucination Auditory  Judgment Impaired  Confusion WDL  Danger to Self  Current suicidal ideation? Passive  Self-Injurious Behavior Some self-injurious ideation observed or expressed.  No lethal plan expressed   Agreement Not to Harm Self Yes  Description of Agreement verbal contract for safety  Danger to Others  Danger to Others None reported or observed  Danger to Others Abnormal  Harmful Behavior to others No threats or harm toward other people

## 2022-01-04 NOTE — Progress Notes (Signed)
Attempted to wake patient up for AM medications but patient is sleeping well, will try at a later time.

## 2022-01-04 NOTE — Progress Notes (Signed)
Pt pleasant this morning while taking her medications. Reports she feels drowsy but slept well. Denies SI/HI/AVH at present.

## 2022-01-04 NOTE — Progress Notes (Signed)
EKG completed and placed on chart for review.

## 2022-01-04 NOTE — Plan of Care (Signed)
  Problem: Education: Goal: Emotional status will improve Outcome: Not Progressing Goal: Mental status will improve Outcome: Not Progressing Goal: Verbalization of understanding the information provided will improve Outcome: Not Progressing   Problem: Activity: Goal: Interest or engagement in activities will improve Outcome: Not Progressing   
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
## 2022-01-04 NOTE — Progress Notes (Signed)
The patient rated her day as a 10 out of 10 since she was able to control her anger. In addition, she mentioned that the voices have improved and that she felt less paranoid as well. Her goal for tomorrow is to attend all of the groups.
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
## 2022-01-04 NOTE — Progress Notes (Signed)
   01/04/22 0545  Sleep  Number of Hours 9.75

## 2022-01-04 NOTE — Progress Notes (Signed)
Pt came to window and asked for PRN for anxiety and agitation r/t voices increasing. Pt states they are jumbled together and she can't understand them but she is getting agitated by them. Prn Zyprexa given.

## 2022-01-05 MED ORDER — IBUPROFEN 200 MG PO TABS
200.0000 mg | ORAL_TABLET | Freq: Every day | ORAL | Status: DC | PRN
  Administered 2022-01-06 – 2022-01-10 (×6): 200 mg via ORAL
  Filled 2022-01-05 (×7): qty 1

## 2022-01-05 MED ORDER — OLANZAPINE 5 MG PO TBDP: 5.0000 mg | ORAL_TABLET | Freq: Every day | ORAL | Status: DC

## 2022-01-05 MED ORDER — OLANZAPINE 5 MG PO TBDP
2.5000 mg | ORAL_TABLET | Freq: Every day | ORAL | Status: DC
  Administered 2022-01-06 – 2022-01-08 (×3): 2.5 mg via ORAL
  Filled 2022-01-05 (×4): qty 0.5

## 2022-01-05 MED ORDER — OLANZAPINE 5 MG PO TBDP
5.0000 mg | ORAL_TABLET | Freq: Every day | ORAL | Status: DC
  Administered 2022-01-05 – 2022-01-07 (×3): 5 mg via ORAL
  Filled 2022-01-05 (×4): qty 1

## 2022-01-05 NOTE — Progress Notes (Signed)
Alert, verbal and able to make needs known. Patient pleasant this morning and cooperative with medication. Endorses some auditory hallucinations, denies visual hallucinations. Patient has been requesting her PRN Zyprexa for the voices because they agitate her, but she doesn't appear to be responding to any internal stimuli and does not appear to be thought blocking. Denies SI/HI. Denies pain. Contracts for safety while on unit. Patient still slightly fixated on other people talking about her child, patient was verbally redirected and assured the patient no one was talking negatively about her daughter. Pt verbalized understanding.

## 2022-01-05 NOTE — Group Note (Signed)
LCSW Group Therapy Note  Group Date: 01/05/2022 Start Time: 1100 End Time: 1200   Type of Therapy and Topic:  Group Therapy: Anger Cues and Responses  Participation Level:  Did Not Attend   Description of Group:   In this group, patients learned how to recognize the physical, cognitive, emotional, and behavioral responses they have to anger-provoking situations.  They identified a recent time they became angry and how they reacted.  They analyzed how their reaction was possibly beneficial and how it was possibly unhelpful.  The group discussed a variety of healthier coping skills that could help with such a situation in the future.  Focus was placed on how helpful it is to recognize the underlying emotions to our anger, because working on those can lead to a more permanent solution as well as our ability to focus on the important rather than the urgent.  Therapeutic Goals: Patients will remember their last incident of anger and how they felt emotionally and physically, what their thoughts were at the time, and how they behaved. Patients will identify how their behavior at that time worked for them, as well as how it worked against them. Patients will explore possible new behaviors to use in future anger situations. Patients will learn that anger itself is normal and cannot be eliminated, and that healthier reactions can assist with resolving conflict rather than worsening situations.  Summary of Patient Progress:  Did not attend  Therapeutic Modalities:   Cognitive Behavioral Therapy    Zachery Conch, LCSW 01/05/2022  11:50 AM

## 2022-01-05 NOTE — Progress Notes (Signed)
Pt up complaining of voices, pt appearing to get agitated, pt given PRN Zyprexa per [facility]

## 2022-01-05 NOTE — Progress Notes (Signed)
Patient informed Probation officer and MHT she was having some thoughts of hurting herself but was not going to follow through with it. Pt contracts verbally for safety. She took a PRN atarax for anxiety

## 2022-01-05 NOTE — Progress Notes (Signed)
Pt wanted to inform treatment team/doctor that her and her ex whom she previously lived with [name]) have worked things out and he would like to talk to the doctor before [place]. Tiannah gives verbal permission for the team to speak with [name]. Phone number is [phone].

## 2022-01-05 NOTE — Progress Notes (Addendum)
[facility] MD Progress Note  01/05/2022 3:49 PM [name]  MRN:  [id number] Principal Problem: Schizoaffective disorder, bipolar type ([place]) Diagnosis: Principal Problem:   Schizoaffective disorder, bipolar type ([place]) Active Problems:   Borderline personality disorder ([place])   PTSD (post-traumatic stress disorder)   Asthma   Tachycardia   GAD (generalized anxiety disorder)   Self-injurious behavior   Fibromyalgia  Reason For Admission: [name] is a 31 yo Caucasian female with past psychiatric history of borderline personality disorder & Schizoaffective disorder who presented to [facility] by [facility] after she called the crises hotline to report that she had overdosed on multiple medications in an attempt to end her life. As per ED assessment, "she took 6 '40mg'$  Zofran pills, 6 '25mg'$  Atenolol pills, 6 '10mg'$  Cyclobenzapr pills, 18 '5mg'$  escitalopram pills, 18 '10mg'$  Hydroxyz pills, 6 '40mg'$  pantroprazole pills, 6 '1mg'$  Prazosin hcl pills, 18 '50mg'$  pregabalin pills, 3 '25mg'$  Quetiapine pills, and 6 1.'5mg'$  vraylar pills."  Patient was involuntarily committed, medically stabilized, then transferred to [facility] for treatment and stabilization of her mood.   24 hr chart review: BP WNL, HR elevated at 105.  Pt received PRN Zyprexa 5 mg for agitation this morning and last night for auditory hallucinations, Trazodone 100 mg last night for insomnia. Patient slept for a total of 7.5 hrs last night. Per chart review patient has received PRN medications for auditory hallucinations with no disruptive behavior in the milieu.   Assessment note: Patient observed in bed. She refused to sit up and stated preference to remain in bed. She is alert and oriented. Eye contact fleeting. Casual appearance. Dysphoric mood. Blunt affect. Cooperative. Reports reason for admission as "overdose, intentional. At the time it was from work but now I don't have any. I don't want to do that again". She reports living in  [place]. Active CST Team through [facility]; currently lives in [place]. Has x16 child 28 years old who is in the custody of paternal grandmother; has visitation once a month. She endorses some feelings of guilt when thinking about if attempt were successful. She reports not attending any unit groups or activities because "I don't want to". She rates her current depression 5/10, anxiety 3/10. Describes her sleep and appetite as "good". She denies any suicidal, homicidal ideations, or visual hallucinations. Endorses ongoing intermittent auditory hallucinations last night and during the morning in which she describes as "radio blows in my head". States the voices are improving; last PRN received during the morning. She is able to contract for safety and states she is opting to remain in her room at this time. Denies any medication side effects at this time. No TD/EPS type symptoms found on assessment, and pt denies any feelings of stiffness. AIMS: 0. We are continuing medications as listed below.  Total Time spent with patient: 45 minutes  Past Psychiatric History: Schizoaffective d/o  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 fragile X syndrome    Chronic constipation    Depression    Drug-seeking behavior    Essential tremor    Headache    Overdose of acetaminophen 07/2017   and other meds   Personality disorder ([place])    Schizo-affective psychosis ([place])    Schizoaffective disorder, bipolar type ([place])    Seizures ([place])    Last seizure December  2017   Sleep apnea     Past Surgical History:  Procedure Laterality Date   MOUTH SURGERY  2009 or 2010   Family History:  Family History  Problem Relation Age of Onset   Mental illness Father    Asthma Father    PDD Brother    Seizures Brother    Family Psychiatric  History: As above Social History:  Social History   Substance and  Sexual Activity  Alcohol Use No   Alcohol/week: 1.0 standard drink of alcohol   Types: 1 Standard drinks or equivalent per week   Comment: denies at this time     Social History   Substance and Sexual Activity  Drug Use No   Comment: History of cocaine use at age 56 for 4 months    Social History   Socioeconomic History   Marital status: Widowed    Spouse name: Not on file   Number of children: 0   Years of education: Not on file   Highest education level: Not on file  Occupational History   Occupation: disability  Tobacco Use   Smoking status: Former    Packs/day: 0.00    Types: Cigarettes   Smokeless tobacco: Never   Tobacco comments:    Smoked for 2  years age [age]  Vaping Use   Vaping Use: Never used  Substance and Sexual Activity   Alcohol use: No    Alcohol/week: 1.0 standard drink of alcohol    Types: 1 Standard drinks or equivalent per week    Comment: denies at this time   Drug use: No    Comment: History of cocaine use at age 40 for 4 months   Sexual activity: Not Currently    Birth control/protection: None  Other Topics Concern   Not on file  Social History Narrative   Marital status: Widowed      Children: daughter      Lives: with boyfriend, in two story home      Employment:  Disability      Tobacco: quit smoking; smoked for two years.      Alcohol ;none      Drugs: none   Has not traveled outside of the country.   Right handed         Social Determinants of Health   Financial Resource Strain: Not on file  Food Insecurity: No Food Insecurity (01/01/2022)   Hunger Vital Sign    Worried About Running Out of Food in the Last Year: Never true    Ran Out of Food in the Last Year: Never true  Recent Concern: Food Insecurity - Food Insecurity Present (12/28/2021)   Hunger Vital Sign    Worried About Charity fundraiser in the Last Year: Often true    Ran Out of Food in the Last Year: Often true  Transportation Needs: Unmet Transportation Needs  (01/01/2022)   PRAPARE - Hydrologist (Medical): No    Lack of Transportation (Non-Medical): Yes  Physical Activity: Not on file  Stress: Not on file  Social Connections: Not on file   Additional Social History:     Sleep: Poor  Appetite:  Good  Current Medications: Current Facility-Administered Medications  Medication Dose Route Frequency Provider Last Rate Last Admin   albuterol (VENTOLIN HFA) 108 (90 Base) MCG/ACT inhaler 2 puff  2 puff Inhalation Q6H PRN Massengill, Ovid Curd, MD       alum & mag hydroxide-simeth (MAALOX/MYLANTA) 200-200-20 MG/5ML  suspension 30 mL  30 mL Oral Q4H PRN Corky Sox, MD   30 mL at 01/05/22 1503   Asenapine PT24 3.8 mg  3.8 mg Transdermal Daily Massengill, Ovid Curd, MD   3.8 mg at 01/05/22 0909   fluticasone furoate-vilanterol (BREO ELLIPTA) 200-25 MCG/ACT 1 puff  1 puff Inhalation Daily Massengill, Ovid Curd, MD   1 puff at 01/05/22 0913   hydrOXYzine (ATARAX) tablet 25 mg  25 mg Oral TID PRN Corky Sox, MD   25 mg at 01/04/22 2024   LORazepam (ATIVAN) tablet 2 mg  2 mg Oral Q6H PRN Massengill, Ovid Curd, MD       Or   LORazepam (ATIVAN) injection 2 mg  2 mg Intramuscular Q6H PRN Massengill, Ovid Curd, MD       magnesium hydroxide (MILK OF MAGNESIA) suspension 30 mL  30 mL Oral Daily PRN Corky Sox, MD       metoprolol succinate (TOPROL-XL) 24 hr tablet 12.5 mg  12.5 mg Oral Daily Massengill, Nathan, MD   12.5 mg at 01/05/22 0910   OLANZapine (ZYPREXA) injection 10 mg  10 mg Intramuscular BID PRN Massengill, Ovid Curd, MD       OLANZapine zydis (ZYPREXA) disintegrating tablet 10 mg  10 mg Oral BID PRN Janine Limbo, MD   10 mg at 01/05/22 0629   OLANZapine zydis (ZYPREXA) disintegrating tablet 5 mg  5 mg Oral Q12H Massengill, Nathan, MD   5 mg at 01/05/22 0911   traZODone (DESYREL) tablet 100 mg  100 mg Oral QHS PRN Nicholes Rough, NP   100 mg at 01/04/22 2025    Lab Results: No results found for this or any previous  visit (from the past 32 hour(s)).  Blood Alcohol level:  Lab Results  Component Value Date   ETH <10 10/29/2021   ETH <10 60/11/9321    Metabolic Disorder Labs: Lab Results  Component Value Date   HGBA1C 5.0 10/07/2021   MPG 96.8 10/07/2021   MPG 96.8 09/02/2021   Lab Results  Component Value Date   PROLACTIN 66.2 (H) 03/02/2021   PROLACTIN 66.1 (H) 02/04/2020   Lab Results  Component Value Date   CHOL 175 12/10/2021   TRIG 145 12/10/2021   HDL 44 12/10/2021   CHOLHDL 4.0 12/10/2021   VLDL 29 12/10/2021   LDLCALC 102 (H) 12/10/2021   LDLCALC 74 10/07/2021   Physical Findings: AIMS: 0 CIWA:  n/a COWS:  n/a   Musculoskeletal: Strength & Muscle Tone: within normal limits Gait & Station: normal Patient leans: N/A  Psychiatric Specialty Exam:  Presentation  General Appearance:  Appropriate for Environment; Fairly Groomed  Eye Contact: Fair  Speech: Clear and Coherent  Speech Volume: Normal  Handedness: Right   Mood and Affect  Mood: Dysphoric  Affect: Congruent; Blunt   Thought Process  Thought Processes: Coherent  Descriptions of Associations:Intact  Orientation:Partial  Thought Content:Logical  History of Schizophrenia/Schizoaffective disorder:Yes  Duration of Psychotic Symptoms:Greater than six months  Hallucinations:Hallucinations: Auditory Description of Auditory Hallucinations: radio blowing   Ideas of Reference:Delusions  Suicidal Thoughts:Suicidal Thoughts: No  Homicidal Thoughts:Homicidal Thoughts: No HI Active Intent and/or Plan: Without Plan  Sensorium  Memory: Immediate Fair; Recent Fair  Judgment: Fair  Insight: Fair  Materials engineer: Fair  Attention Span: Fair  Recall: [employer] of Knowledge: Fair  Language: Fair  Psychomotor Activity  Psychomotor Activity: Psychomotor Activity: Normal  Assets  Assets: Physical Health; Resilience  Sleep  Sleep: Sleep:  Good  Physical Exam: Physical Exam Constitutional:  Appearance: Normal appearance.  HENT:     Head: Normocephalic.     Nose: Nose normal. No congestion or rhinorrhea.  Eyes:     Pupils: Pupils are equal, round, and reactive to light.  Pulmonary:     Effort: Pulmonary effort is normal.  Musculoskeletal:        General: Normal range of motion.     Cervical back: Normal range of motion.  Neurological:     General: No focal deficit present.     Mental Status: She is alert.  Psychiatric:        Attention and Perception: She perceives auditory hallucinations. She does not perceive visual hallucinations.        Mood and Affect: Affect is blunt and inappropriate.        Speech: Speech normal.        Behavior: Behavior is withdrawn.        Thought Content: Thought content is not paranoid or delusional. Thought content does not include homicidal or suicidal ideation. Thought content does not include homicidal or suicidal plan.        Judgment: Judgment is impulsive.    Review of Systems  Constitutional:  Negative for fever.  HENT:  Negative for hearing loss.   Eyes: Negative.   Respiratory: Negative.    Gastrointestinal:  Negative for heartburn.  Musculoskeletal: Negative.   Skin:  Negative for rash.  Neurological: Negative.   Psychiatric/Behavioral:  Positive for depression and hallucinations (paranoia). Negative for memory loss, substance abuse and suicidal ideas. The patient is nervous/anxious. The patient does not have insomnia.    Blood pressure 114/63, pulse (!) 104, temperature 98.5 F (36.9 C), resp. rate 16, height '5\' 4"'$  (1.626 m), weight 88.9 kg, SpO2 100 %. Body mass index is 33.64 kg/m.  Treatment Plan Summary: Daily contact with patient to assess and evaluate symptoms and progress in treatment and Medication management   Observation Level/Precautions:  15 minute checks  Laboratory:  Labs reviewed   Psychotherapy:  Unit Group sessions  Medications:  See [facility]   Consultations:  To be determined   Discharge Concerns:  Safety, medication compliance, mood stability  Estimated LOS: 5-7 days  Other:  N/A    Labs Reviewed on 01/04/2022: CMP& CBC reviewed. Beta hcg negative, Tox screen negative, UA with small leukocytes and rare bacteria, pt asymptomatic for a UTI. TSH WNL, Lipid panel WNL, HA1C from 10/07/21 WNL. QTC from 11/27 with QTC slightly prolonged at 456. Repeat EKG ordered and pending.    PLAN Safety and Monitoring: Involuntary admission to inpatient psychiatric unit for safety, stabilization and treatment Daily contact with patient to assess and evaluate symptoms and progress in treatment Patient's case to be discussed in multi-disciplinary team meeting Observation Level : q15 minute checks Vital signs: q12 hours Precautions: Safety No Roommate d/t psychosis and aggressive behaviors   Long Term Goal(s): Improvement in symptoms so as ready for discharge   Short Term Goals: Ability to identify changes in lifestyle to reduce recurrence of condition will improve, Ability to verbalize feelings will improve, Ability to disclose and discuss suicidal ideas, Ability to demonstrate self-control will improve, Ability to identify and develop effective coping behaviors will improve, and Compliance with prescribed medications will improve   Physician Treatment Plan for Secondary Diagnosis:  Principal Problem:   Schizoaffective disorder, bipolar type ([place]) Active Problems:   Borderline personality disorder (H[facility])   PTSD (post-traumatic stress disorder)   Asthma   Tachycardia   GAD (generalized anxiety disorder)  Self-injurious behavior   Fibromyalgia   Medications Schizoaffective disorder, bipolar type -Continue:    - Saphris patch 3.8 mg for psychosis & mood (patch is a safe choice for pt currently d/t h/o multiple  overdoses on PO medications. QTC with QTC-456, repeat EKG ordered)    - Zyprexa 5 mg PO Q12 H for  psychosis/mood  Hypertension: - Continue:    - Toprol XL 12.5 mg daily for tachycardia on 01/04/22  Asthma: -Continue: - Breo Ellipta inhaler daily for asthma  Recent Medication Trials:  -Discontinued Seroquel 25 mg BID on 11/29 -Discontinued Inderal 5 mg Q 12 H for tachycardia on 11/29.   Other PRNS -Continue:  - Tylenol 650 mg every 6 hours PRN for mild pain - Maalox 30 mg every 4 hrs PRN for indigestion - Milk of Magnesia as needed every 6 hrs for constipation   - Albuterol PRN for SOB/Wheezing - Ativan 2 mg  PO/IM for agitation PRN for agitation - Zyprexa 10 mg IM PRN BID for agitation - Hydroxyzine 25 mg TID PRN for anxiety - Trazodone to 100 mg nightly PRN for sleep   Discharge Planning: Social work and case management to assist with discharge planning and identification of hospital follow-up needs prior to discharge Estimated LOS: 5-7 days Discharge Concerns: Need to establish a safety plan; Medication compliance and effectiveness Discharge Goals: Return home with outpatient referrals for mental health follow-up including medication management/psychotherapy  Inda Merlin, NP 01/05/2022, 3:49 PMPatient ID: [name], female   DOB: [birth year], 31 y.o.   MRN: [id number]

## 2022-01-05 NOTE — Progress Notes (Signed)
   01/05/22 2215  Psych Admission Type (Psych Patients Only)  Admission Status Voluntary  Psychosocial Assessment  Patient Complaints Suspiciousness  Eye Contact Fair  Facial Expression Anxious  Affect Anxious;Preoccupied  Speech Tangential  Interaction Assertive  Motor Activity Slow  Appearance/Hygiene Unremarkable  Behavior Characteristics Anxious  Mood Preoccupied  Aggressive Behavior  Effect No apparent injury  Thought Process  Coherency Tangential  Content Blaming others  Delusions WDL  Perception Hallucinations  Hallucination Auditory  Judgment Impaired  Confusion WDL  Danger to Self  Current suicidal ideation? Passive  Self-Injurious Behavior Some self-injurious ideation observed or expressed.  No lethal plan expressed   Agreement Not to Harm Self Yes  Description of Agreement verbal contract for safety  Danger to Others  Danger to Others None reported or observed  Danger to Others Abnormal  Harmful Behavior to others No threats or harm toward other people

## 2022-01-05 NOTE — Progress Notes (Signed)
Adult Psychoeducational Group Note  Date:  01/05/2022 Time:  8:57 PM  Group Topic/Focus:  Wrap-Up Group:   The focus of this group is to help patients review their daily goal of treatment and discuss progress on daily workbooks.  Participation Level:  Did Not Attend  Participation Quality:   n/a  Affect:   n/a  Cognitive:   n/a  Insight: None  Engagement in Group:   n/a  Modes of Intervention:   n/a  Additional Comments:   Pt did not attend the Wrap Up group.  Wetzel Bjornstad Trayshawn Durkin 01/05/2022, 8:57 PM

## 2022-01-05 NOTE — [facility] Group Notes (Signed)
Spirituality Group facilitated by Kathrynn Humble, [facility]   Group focused on topic of strength. Group members reflected on what thoughts and feelings emerge when they hear this topic. They then engaged in facilitated dialog around how strength is present in their lives. This dialog focused on representing what strength had been to them in their lives (images and patterns given) and what they saw as helpful in their life now (what they needed / wanted).  Activity drew on narrative framework.   Patient Progress: Did not attend.

## 2022-01-05 NOTE — Plan of Care (Signed)
  Problem: Education: Goal: Knowledge of [facility] Education information/materials will improve Outcome: Progressing Goal: Emotional status will improve Outcome: Progressing Goal: Mental status will improve Outcome: Progressing Goal: Verbalization of understanding the information provided will improve Outcome: Progressing   Problem: Activity: Goal: Interest or engagement in activities will improve Outcome: Progressing Goal: Sleeping patterns will improve Outcome: Progressing   Problem: Coping: Goal: Ability to verbalize frustrations and anger appropriately will improve Outcome: Progressing Goal: Ability to demonstrate self-control will improve Outcome: Progressing

## 2022-01-05 NOTE — Progress Notes (Signed)
   01/05/22 0500  Sleep  Number of Hours 7.5

## 2022-01-05 NOTE — Progress Notes (Signed)
[name] was very distressed and tearful.  Chaplain sat with her to provide emotional support.  She was not agitated, just sad.  She shared that she lost custody of her daughter and she is grieving.  Chaplain provided grief support and emotional support until she was feeling better enough to go lay down and try to rest.  [place], [place] Pager, [phone]

## 2022-01-06 MED ORDER — PENICILLIN V POTASSIUM 500 MG PO TABS
500.0000 mg | ORAL_TABLET | Freq: Four times a day (QID) | ORAL | Status: DC
  Administered 2022-01-06 – 2022-01-10 (×17): 500 mg via ORAL
  Filled 2022-01-06 (×30): qty 1

## 2022-01-06 NOTE — Progress Notes (Signed)
Adult Psychoeducational Group Note  Date:  01/06/2022 Time:  8:36 PM  Group Topic/Focus:  Wrap-Up Group:   The focus of this group is to help patients review their daily goal of treatment and discuss progress on daily workbooks.  Participation Level:  Did Not Attend  Participation Quality:  Did Not Attend  Affect:  Did Not Attend  Cognitive:  Did Not Attend  Insight: Did Not Attend  Engagement in Group:  Did Not Attend  Modes of Intervention:  Did Not Attend  Additional Comments:   Pt was encouraged but refused to attend group discussion.   Gerhard Perches 01/06/2022, 8:36 PM

## 2022-01-06 NOTE — [facility] Group Notes (Signed)
Psychoeducational Group Note  Date: 01/06/22 Time: 0900-1000    Goal Setting   Purpose of Group: This group helps to provide patients with the steps of setting a goal that is specific, measurable, attainable, realistic and time specific. A discussion on how we keep ourselves stuck with negative self talk. Homework given for Patients to write 30 positive attributes about themselves.    Participation Level:  did not attend  [name]

## 2022-01-06 NOTE — Plan of Care (Signed)
  Problem: Health Behavior/Discharge Planning: Goal: Compliance with treatment plan for underlying cause of condition will improve Outcome: Progressing   Problem: Physical Regulation: Goal: Ability to maintain clinical measurements within normal limits will improve Outcome: Progressing   Problem: Safety: Goal: Periods of time without injury will increase Outcome: Progressing   Problem: Education: Goal: Ability to make informed decisions regarding treatment will improve Outcome: Progressing   Problem: Coping: Goal: Coping ability will improve Outcome: Progressing

## 2022-01-06 NOTE — Progress Notes (Signed)
Patient was compliant with her medications but isolative to her room.   01/06/22 2045  Psych Admission Type (Psych Patients Only)  Admission Status Voluntary  Psychosocial Assessment  Patient Complaints None  Eye Contact Brief  Facial Expression Flat  Affect Sad  Speech Logical/coherent;Soft  Interaction Isolative  Motor Activity Slow  Appearance/Hygiene Poor hygiene  Behavior Characteristics Appropriate to situation;Calm  Mood Preoccupied;Pleasant  Thought Process  Coherency Circumstantial  Content Blaming others  Delusions None reported or observed  Perception Hallucinations  Hallucination Auditory  Judgment Impaired  Confusion None  Danger to Self  Current suicidal ideation? Denies  Self-Injurious Behavior No self-injurious ideation or behavior indicators observed or expressed   Agreement Not to Harm Self Yes  Description of Agreement verbal contracts  Danger to Others  Danger to Others None reported or observed  Danger to Others Abnormal  Harmful Behavior to others No threats or harm toward other people

## 2022-01-06 NOTE — Group Note (Deleted)
LCSW Group Therapy Note  Group Date: 01/06/2022 Start Time: 1000 End Time: 1100   Type of Therapy and Topic:  Group Therapy: Using "I" Statements  Participation Level:  {BHH PARTICIPATION [id number]}  Description of Group:  Patients were asked to provide details of some interpersonal conflicts they have experienced. Patients were then educated about "I" statements, communication which focuses on feelings or views of the speaker rather than what the other person is doing. T group members were asked to reflect on past conflicts and to provide specific examples for utilizing "I" statements.  Therapeutic Goals:  1. Patients will verbalize understanding of ineffective communication and effective communication. 2. Patients will be able to empathize with whom they are having conflict. 3. Patients will practice effective communication in the form of "I" statements.    Summary of Patient Progress:  *** shared ***. The patient was ***present/active throughout the session and proved open to feedback from [place] and peers. Patient demonstrated *** insight into the subject matter, was respectful of peers, and was present throughout the entire session.  Therapeutic Modalities:   Cognitive Behavioral Therapy Solution-Focused Therapy    Maretta Los, LCSW 01/06/2022  9:42 AM

## 2022-01-06 NOTE — Progress Notes (Signed)
[facility] MD Progress Note  01/06/2022 2:39 PM Mandee Pluta  MRN:  [id number] Principal Problem: Schizoaffective disorder, bipolar type ([place]) Diagnosis: Principal Problem:   Schizoaffective disorder, bipolar type ([place]) Active Problems:   Borderline personality disorder ([place])   PTSD (post-traumatic stress disorder)   Asthma   Tachycardia   GAD (generalized anxiety disorder)   Self-injurious behavior   Fibromyalgia  Reason For Admission: Megahn Killings is a 31 yo Caucasian female with past psychiatric history of borderline personality disorder & Schizoaffective disorder who presented to [facility] by [facility] after she called the crises hotline to report that she had overdosed on multiple medications in an attempt to end her life. As per ED assessment, "she took 6 '40mg'$  Zofran pills, 6 '25mg'$  Atenolol pills, 6 '10mg'$  Cyclobenzapr pills, 18 '5mg'$  escitalopram pills, 18 '10mg'$  Hydroxyz pills, 6 '40mg'$  pantroprazole pills, 6 '1mg'$  Prazosin hcl pills, 18 '50mg'$  pregabalin pills, 3 '25mg'$  Quetiapine pills, and 6 1.'5mg'$  vraylar pills."  Patient was involuntarily committed, medically stabilized, then transferred to [facility] for treatment and stabilization of her mood.   24 hr chart review: BP WNL, HR elevated at 105.  Pt received PRN Zyprexa 5 mg for agitation 1440 for agiation and hallucinations, Trazodone 100 mg last night for insomnia. Patient slept for a total of 7.25 hrs last night. Per chart review patient has received PRN Ibuprofen for oral pain, Zyprexa for auditory hallucinations with no disruptive behavior in the milieu.   Assessment note: Provider attempted assessment earlier in shift. Patient remained in bed refusing to engage in assessment after several attempts. Provider notified patient will attempt assessment at later time.   Patient observed in bed. She continues to refuse to fully engage in assessment. She is oriented x3. Eye contact minimal as she stares at the ceiling majority of the  assessment. Disheveled appearance. Dysphoric, irritable mood. Blunt affect. Cooperative. Reports having "good night". Continues to endorse ongoing depression and states this as reason she is not engaging in unit activities. She rates her current depression 7/10. Denies any suicidal ideations. States she is ready for discharge to "go back to whatever is left of my apartment". Provider discussed the need to engage in treatment including group and assessments; patient verbalized an understanding but blames depression as reason she hasn't.  She continues to deny any homicidal ideations, or visual hallucinations. Endorses ongoing intermittent auditory hallucinations; reports voices as chronic but are improving. She is able to contract for safety and continues to state she is opting to remain in her room at this time. Denies any medication side effects at this time. No TD/EPS type symptoms found on assessment, and pt denies any feelings of stiffness. AIMS: 0. We are continuing medications as listed below.  Total Time spent with patient: 45 minutes  Past Psychiatric History: Schizoaffective d/o  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 fragile X syndrome    Chronic constipation    Depression    Drug-seeking behavior    Essential tremor    Headache    Overdose of acetaminophen 07/2017   and other meds   Personality disorder ([place])    Schizo-affective psychosis ([place])    Schizoaffective disorder, bipolar type ([place])    Seizures ([place])    Last seizure December 2017   Sleep apnea     Past Surgical  History:  Procedure Laterality Date   MOUTH SURGERY  2009 or 2010   Family History:  Family History  Problem Relation Age of Onset   Mental illness Father    Asthma Father    PDD Brother    Seizures Brother    Family Psychiatric  History: As above Social History:  Social History    Substance and Sexual Activity  Alcohol Use No   Alcohol/week: 1.0 standard drink of alcohol   Types: 1 Standard drinks or equivalent per week   Comment: denies at this time     Social History   Substance and Sexual Activity  Drug Use No   Comment: History of cocaine use at age 31 for 4 months    Social History   Socioeconomic History   Marital status: Widowed    Spouse name: Not on file   Number of children: 0   Years of education: Not on file   Highest education level: Not on file  Occupational History   Occupation: disability  Tobacco Use   Smoking status: Former    Packs/day: 0.00    Types: Cigarettes   Smokeless tobacco: Never   Tobacco comments:    Smoked for 2  years age [age]  Vaping Use   Vaping Use: Never used  Substance and Sexual Activity   Alcohol use: No    Alcohol/week: 1.0 standard drink of alcohol    Types: 1 Standard drinks or equivalent per week    Comment: denies at this time   Drug use: No    Comment: History of cocaine use at age 31 for 4 months   Sexual activity: Not Currently    Birth control/protection: None  Other Topics Concern   Not on file  Social History Narrative   Marital status: Widowed      Children: daughter      Lives: with boyfriend, in two story home      Employment:  Disability      Tobacco: quit smoking; smoked for two years.      Alcohol ;none      Drugs: none   Has not traveled outside of the country.   Right handed         Social Determinants of Health   Financial Resource Strain: Not on file  Food Insecurity: No Food Insecurity (01/01/2022)   Hunger Vital Sign    Worried About Running Out of Food in the Last Year: Never true    Ran Out of Food in the Last Year: Never true  Recent Concern: Food Insecurity - Food Insecurity Present (12/28/2021)   Hunger Vital Sign    Worried About Charity fundraiser in the Last Year: Often true    Ran Out of Food in the Last Year: Often true  Transportation Needs: Unmet  Transportation Needs (01/01/2022)   PRAPARE - Hydrologist (Medical): No    Lack of Transportation (Non-Medical): Yes  Physical Activity: Not on file  Stress: Not on file  Social Connections: Not on file   Additional Social History:     Sleep: Poor  Appetite:  Good  Current Medications: Current Facility-Administered Medications  Medication Dose Route Frequency Provider Last Rate Last Admin   albuterol (VENTOLIN HFA) 108 (90 Base) MCG/ACT inhaler 2 puff  2 puff Inhalation Q6H PRN Massengill, Ovid Curd, MD       alum & mag hydroxide-simeth (MAALOX/MYLANTA) 200-200-20 MG/5ML suspension 30 mL  30 mL Oral Q4H PRN Corky Sox,  MD   30 mL at 01/05/22 1503   Asenapine PT24 3.8 mg  3.8 mg Transdermal Daily Massengill, Ovid Curd, MD   3.8 mg at 01/06/22 0845   fluticasone furoate-vilanterol (BREO ELLIPTA) 200-25 MCG/ACT 1 puff  1 puff Inhalation Daily Massengill, Ovid Curd, MD   1 puff at 01/06/22 0853   hydrOXYzine (ATARAX) tablet 25 mg  25 mg Oral TID PRN Corky Sox, MD   25 mg at 01/05/22 2055   ibuprofen (ADVIL) tablet 200 mg  200 mg Oral Daily PRN Bobbitt, Shalon E, NP   200 mg at 01/06/22 8341   LORazepam (ATIVAN) tablet 2 mg  2 mg Oral Q6H PRN Massengill, Ovid Curd, MD       Or   LORazepam (ATIVAN) injection 2 mg  2 mg Intramuscular Q6H PRN Massengill, Ovid Curd, MD       magnesium hydroxide (MILK OF MAGNESIA) suspension 30 mL  30 mL Oral Daily PRN Corky Sox, MD       metoprolol succinate (TOPROL-XL) 24 hr tablet 12.5 mg  12.5 mg Oral Daily Massengill, Nathan, MD   12.5 mg at 01/06/22 0854   OLANZapine (ZYPREXA) injection 10 mg  10 mg Intramuscular BID PRN Massengill, Ovid Curd, MD       OLANZapine zydis (ZYPREXA) disintegrating tablet 10 mg  10 mg Oral BID PRN Janine Limbo, MD   10 mg at 01/05/22 0629   OLANZapine zydis (ZYPREXA) disintegrating tablet 2.5 mg  2.5 mg Oral Daily Massengill, Nathan, MD   2.5 mg at 01/06/22 0853   OLANZapine zydis (ZYPREXA)  disintegrating tablet 5 mg  5 mg Oral QHS Massengill, Ovid Curd, MD   5 mg at 01/05/22 2054   traZODone (DESYREL) tablet 100 mg  100 mg Oral QHS PRN Nicholes Rough, NP   100 mg at 01/05/22 2055    Lab Results: No results found for this or any previous visit (from the past 51 hour(s)).  Blood Alcohol level:  Lab Results  Component Value Date   ETH <10 10/29/2021   ETH <10 96/22/2979    Metabolic Disorder Labs: Lab Results  Component Value Date   HGBA1C 5.0 10/07/2021   MPG 96.8 10/07/2021   MPG 96.8 09/02/2021   Lab Results  Component Value Date   PROLACTIN 66.2 (H) 03/02/2021   PROLACTIN 66.1 (H) 02/04/2020   Lab Results  Component Value Date   CHOL 175 12/10/2021   TRIG 145 12/10/2021   HDL 44 12/10/2021   CHOLHDL 4.0 12/10/2021   VLDL 29 12/10/2021   LDLCALC 102 (H) 12/10/2021   LDLCALC 74 10/07/2021   Physical Findings: AIMS: 0 CIWA:  n/a COWS:  n/a   Musculoskeletal: Strength & Muscle Tone: within normal limits Gait & Station: normal Patient leans: N/A  Psychiatric Specialty Exam:  Presentation  General Appearance:  Appropriate for Environment; Fairly Groomed  Eye Contact: Fair  Speech: Clear and Coherent  Speech Volume: Normal  Handedness: Right   Mood and Affect  Mood: Dysphoric; Irritable  Affect: Labile; Restricted   Thought Process  Thought Processes: Other (comment)  Descriptions of Associations:Intact  Orientation:Partial  Thought Content:Illogical  History of Schizophrenia/Schizoaffective disorder:Yes  Duration of Psychotic Symptoms:Greater than six months  Hallucinations:Hallucinations: Auditory Description of Auditory Hallucinations: radio blowing   Ideas of Reference:None  Suicidal Thoughts:Suicidal Thoughts: No  Homicidal Thoughts:Homicidal Thoughts: No  Sensorium  Memory: Immediate Fair; Recent Fair  Judgment: Fair  Insight: Fair  Materials engineer: Fair  Attention  Span: Fair  Recall: [employer] of Knowledge:  Fair  Language: Fair  Psychomotor Activity  Psychomotor Activity: Psychomotor Activity: Normal  Assets  Assets: Physical Health; Resilience; Housing; Financial Resources/Insurance  Sleep  Sleep: Sleep: Good Number of Hours of Sleep: 7.25  Physical Exam: Physical Exam Vitals and nursing note reviewed.  Constitutional:      Appearance: Normal appearance.  HENT:     Head: Normocephalic.     Nose: Nose normal. No congestion or rhinorrhea.  Eyes:     Pupils: Pupils are equal, round, and reactive to light.  Cardiovascular:     Rate and Rhythm: Tachycardia present.  Pulmonary:     Effort: Pulmonary effort is normal.  Abdominal:     Palpations: Abdomen is soft.  Musculoskeletal:        General: Normal range of motion.     Cervical back: Normal range of motion.  Skin:    General: Skin is warm and dry.  Neurological:     General: No focal deficit present.     Mental Status: She is alert.  Psychiatric:        Attention and Perception: She perceives auditory hallucinations. She does not perceive visual hallucinations.        Mood and Affect: Affect is labile, blunt and inappropriate.        Speech: Speech normal.        Behavior: Behavior is withdrawn.        Thought Content: Thought content is not paranoid or delusional. Thought content does not include homicidal or suicidal ideation. Thought content does not include homicidal or suicidal plan.        Judgment: Judgment is impulsive.    Review of Systems  Constitutional:  Negative for fever.  HENT:  Negative for hearing loss.   Eyes: Negative.   Respiratory: Negative.    Gastrointestinal:  Negative for heartburn.  Musculoskeletal: Negative.   Skin:  Negative for rash.  Neurological: Negative.   Psychiatric/Behavioral:  Positive for depression and hallucinations (paranoia). Negative for memory loss, substance abuse and suicidal ideas. The patient is nervous/anxious.  The patient does not have insomnia.    Blood pressure 107/89, pulse (!) 103, temperature 98.2 F (36.8 C), temperature source Oral, resp. rate 16, height '5\' 4"'$  (1.626 m), weight 88.9 kg, SpO2 100 %. Body mass index is 33.64 kg/m.  Treatment Plan Summary: Daily contact with patient to assess and evaluate symptoms and progress in treatment and Medication management   Observation Level/Precautions:  15 minute checks  Laboratory:  Labs reviewed   Psychotherapy:  Unit Group sessions  Medications:  [facility]  Consultations:  To be determined   Discharge Concerns:  Safety, medication compliance, mood stability  Estimated LOS: 5-7 days  Other:  N/A    Labs Reviewed on 01/04/2022: CMP& CBC reviewed. Beta hcg negative, Tox screen negative, UA with small leukocytes and rare bacteria, pt asymptomatic for a UTI. TSH WNL, Lipid panel WNL, HA1C from 10/07/21 WNL. QTC from 11/27 with QTC slightly prolonged at 456. Repeat EKG ordered and pending.    PLAN Safety and Monitoring: Involuntary admission to inpatient psychiatric unit for safety, stabilization and treatment Daily contact with patient to assess and evaluate symptoms and progress in treatment Patient's case to be discussed in multi-disciplinary team meeting Observation Level : q15 minute checks Vital signs: q12 hours Precautions: Safety No Roommate d/t psychosis and aggressive behaviors   Long Term Goal(s): Improvement in symptoms so as ready for discharge   Short Term Goals: Ability to identify changes in lifestyle to  reduce recurrence of condition will improve, Ability to verbalize feelings will improve, Ability to disclose and discuss suicidal ideas, Ability to demonstrate self-control will improve, Ability to identify and develop effective coping behaviors will improve, and Compliance with prescribed medications will improve   Physician Treatment Plan for Secondary Diagnosis:  Principal Problem:   Schizoaffective disorder, bipolar type  ([place]) Active Problems:   Borderline personality disorder ([facility])   PTSD (post-traumatic stress disorder)   Asthma   Tachycardia   GAD (generalized anxiety disorder)   Self-injurious behavior   Fibromyalgia   Medications Schizoaffective disorder, bipolar type -Continue:    - Saphris patch 3.8 mg for psychosis & mood (patch is a safe choice for pt currently d/t h/o multiple  overdoses on PO medications. QTC with QTC-456, repeat EKG ordered)    - Zyprexa 2.5 mg daily, 5 mg PO HS for psychosis/mood. Plan to continue to decrease medication.   Hypertension: - Continue:    - Toprol XL 12.5 mg daily for tachycardia on 01/04/22  Asthma: -Continue: - Breo Ellipta inhaler daily for asthma  Recent Medication Trials:  -Discontinued Seroquel 25 mg BID on 11/29 -Discontinued Inderal 5 mg Q 12 H for tachycardia on 11/29.   Other PRNS -Continue:  - Tylenol 650 mg every 6 hours PRN for mild pain - Maalox 30 mg every 4 hrs PRN for indigestion - Milk of Magnesia as needed every 6 hrs for constipation   - Albuterol PRN for SOB/Wheezing - Ativan 2 mg  PO/IM for agitation PRN for agitation - Zyprexa 10 mg IM PRN BID for agitation - Hydroxyzine 25 mg TID PRN for anxiety - Trazodone to 100 mg nightly PRN for sleep   Discharge Planning: Social work and case management to assist with discharge planning and identification of hospital follow-up needs prior to discharge Estimated LOS: 5-7 days Discharge Concerns: Need to establish a safety plan; Medication compliance and effectiveness Discharge Goals: Return home with outpatient referrals for mental health follow-up including medication management/psychotherapy  Inda Merlin, NP 01/06/2022, 2:39 PMPatient ID: [name], female   DOB: 1990-10-11, 31 y.o.   MRN: [id number]

## 2022-01-06 NOTE — Progress Notes (Signed)
   01/06/22 0614  Sleep  Number of Hours 7.25

## 2022-01-06 NOTE — Group Note (Signed)
LCSW Group Therapy Note   No social work group was held; the following was provided to each patient  in lieu of in-person group:  Healthy vs. 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

## 2022-01-06 NOTE — Progress Notes (Signed)
Pt has been in her room most of the day, did not attend groups earlier this morning. She denies SI at this present time. Denies HI. Endorses some ongoing AH but seems to be chronic for her. Pt didn't go to lunch this afternoon but compliant with her medications. Denies any physical pain at present. Will continue to encourage pt to attend groups and participate in the milleu.

## 2022-01-07 NOTE — Progress Notes (Signed)
ed     Adult Psychoeducational Group Note  Date:  01/07/2022 Time:  1430-1500 Group Topic/Focus: Mental Health Relapses/Medication  Participation Level: Did not attend   Participation Quality:  Did not attend    Affect: Did not attend   Cognitive:  Did not attend   Insight: Did not attend   Engagement in Group: Did not attend   Modes of Intervention:  N/A Additional Comments:  Pt was invited but did not attend     [employer] RN 01/07/22 1506

## 2022-01-07 NOTE — Progress Notes (Signed)
Adult Psychoeducational Group Note  Date:  01/07/2022 Time:  9:06 PM  Group Topic/Focus:  Wrap-Up Group:   The focus of this group is to help patients review their daily goal of treatment and discuss progress on daily workbooks.  Participation Level:  Active  Participation Quality:  Appropriate  Affect:  Appropriate  Cognitive:  Appropriate  Insight: Appropriate  Engagement in Group:  Engaged  Modes of Intervention:  Discussion  Additional Comments:   Pt states she's had a good day but has been anxious about discharge because she hasn't been able to get a clear answer. Pt has fast speech. Pt wants to continue with her outpatient treatment.   Gerhard Perches 01/07/2022, 9:06 PM

## 2022-01-07 NOTE — Progress Notes (Signed)
   01/07/22 2045  Psych Admission Type (Psych Patients Only)  Admission Status Voluntary  Psychosocial Assessment  Patient Complaints Other (Comment) (pt reports ready for discharge)  Eye Contact Brief  Facial Expression Flat  Affect Sad  Speech Logical/coherent;Soft  Interaction Isolative  Motor Activity Slow  Appearance/Hygiene Poor hygiene  Behavior Characteristics Cooperative  Mood Pleasant  Thought Process  Coherency Circumstantial  Content Blaming others  Delusions None reported or observed  Perception Hallucinations  Hallucination Auditory  Judgment Impaired  Confusion None  Danger to Self  Current suicidal ideation? Denies  Self-Injurious Behavior No self-injurious ideation or behavior indicators observed or expressed   Agreement Not to Harm Self Yes  Description of Agreement verbal contracts  Danger to Others  Danger to Others None reported or observed  Danger to Others Abnormal  Harmful Behavior to others No threats or harm toward other people

## 2022-01-07 NOTE — Progress Notes (Signed)
[facility] MD Progress Note  01/07/2022 2:17 PM [name]  MRN:  [id number] Principal Problem: Schizoaffective disorder, bipolar type ([place]) Diagnosis: Principal Problem:   Schizoaffective disorder, bipolar type ([place]) Active Problems:   Borderline personality disorder ([place])   PTSD (post-traumatic stress disorder)   Asthma   Tachycardia   GAD (generalized anxiety disorder)   Self-injurious behavior   Fibromyalgia  Reason For Admission: [name] is a 31 yo Caucasian female with past psychiatric history of borderline personality disorder & Schizoaffective disorder who presented to [facility] by [facility] after she called the crises hotline to report that she had overdosed on multiple medications in an attempt to end her life. As per ED assessment, "she took 6 '40mg'$  Zofran pills, 6 '25mg'$  Atenolol pills, 6 '10mg'$  Cyclobenzapr pills, 18 '5mg'$  escitalopram pills, 18 '10mg'$  Hydroxyz pills, 6 '40mg'$  pantroprazole pills, 6 '1mg'$  Prazosin hcl pills, 18 '50mg'$  pregabalin pills, 3 '25mg'$  Quetiapine pills, and 6 1.'5mg'$  vraylar pills."  Patient was involuntarily committed, medically stabilized, then transferred to [facility] for treatment and stabilization of her mood.   24 hr chart review: Patient continues to spend majority of her time in the bed with minimal interaction with peers and staff. BP WNL, HR elevated at 113. Last received PRN Zyprexa 5 mg 2040 last night for reported auditory hallucinations, Ibuprofen 200 mg 1606 yesterday for tooth pain, Trazodone 100 mg last night for insomnia. Penicillin 500 mg PO q6 hrs was restarted from recent ED visit for gum abscess as patient continues to complain of pain and reports not starting medication upon discharge from ED. She continues to sleep throughout the night.   Assessment note: Provider attempted assessment earlier in shift. Patient remained in bed refusing to engage in assessment after several attempts. Provider notified patient will attempt assessment  at later time.   Patient observed in bed. She continues to refuse to fully engage in assessment. She is oriented x3. Lays in bed looking at the ceiling majority of the assessment, minimal eye contact. Disheveled appearance. Dysphoric, irritable mood; only responds in 2-3 word sentences. Blunt affect. Slow to cooperate. Describes sleeping and appetite as "good". Continues to endorse ongoing depression and states this as reason she is not engaging in unit activities however states she did go to the gym on yesterday and "felt better" afterwards; reports goal/plan to attend x1 group this afternoon. She currently rates her depression 5/10. Denies any anxiety or suicidal ideations. Continues to endorse being "ready for discharge" says that social work is getting her into a PHP program upon discharge. She continues to deny any homicidal ideations, or visual hallucinations. Endorses ongoing intermittent auditory hallucinations; reports voices as "a little more today". She is able to contract for safety and continues to state she is opting to remain in her room at this time. Denies any medication side effects at this time. No TD/EPS type symptoms found on assessment, and pt denies any feelings of stiffness. AIMS: 0. We are continuing medications as listed below.  Total Time spent with patient: 45 minutes  Past Psychiatric History: Schizoaffective d/o  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 fragile X syndrome    Chronic constipation    Depression    Drug-seeking behavior    Essential tremor    Headache    Overdose of acetaminophen 07/2017  and other meds   Personality disorder ([place])    Schizo-affective psychosis ([place])    Schizoaffective disorder, bipolar type ([place])    Seizures ([place])    Last seizure December 2017   Sleep apnea     Past Surgical History:  Procedure Laterality Date    MOUTH SURGERY  2009 or 2010   Family History:  Family History  Problem Relation Age of Onset   Mental illness Father    Asthma Father    PDD Brother    Seizures Brother    Family Psychiatric  History: As above Social History:  Social History   Substance and Sexual Activity  Alcohol Use No   Alcohol/week: 1.0 standard drink of alcohol   Types: 1 Standard drinks or equivalent per week   Comment: denies at this time     Social History   Substance and Sexual Activity  Drug Use No   Comment: History of cocaine use at age 70 for 4 months    Social History   Socioeconomic History   Marital status: Widowed    Spouse name: Not on file   Number of children: 0   Years of education: Not on file   Highest education level: Not on file  Occupational History   Occupation: disability  Tobacco Use   Smoking status: Former    Packs/day: 0.00    Types: Cigarettes   Smokeless tobacco: Never   Tobacco comments:    Smoked for 2  years age [age]  Vaping Use   Vaping Use: Never used  Substance and Sexual Activity   Alcohol use: No    Alcohol/week: 1.0 standard drink of alcohol    Types: 1 Standard drinks or equivalent per week    Comment: denies at this time   Drug use: No    Comment: History of cocaine use at age 41 for 4 months   Sexual activity: Not Currently    Birth control/protection: None  Other Topics Concern   Not on file  Social History Narrative   Marital status: Widowed      Children: daughter      Lives: with boyfriend, in two story home      Employment:  Disability      Tobacco: quit smoking; smoked for two years.      Alcohol ;none      Drugs: none   Has not traveled outside of the country.   Right handed         Social Determinants of Health   Financial Resource Strain: Not on file  Food Insecurity: No Food Insecurity (01/01/2022)   Hunger Vital Sign    Worried About Running Out of Food in the Last Year: Never true    Ran Out of Food in the Last Year:  Never true  Recent Concern: Food Insecurity - Food Insecurity Present (12/28/2021)   Hunger Vital Sign    Worried About Charity fundraiser in the Last Year: Often true    Ran Out of Food in the Last Year: Often true  Transportation Needs: Unmet Transportation Needs (01/01/2022)   PRAPARE - Hydrologist (Medical): No    Lack of Transportation (Non-Medical): Yes  Physical Activity: Not on file  Stress: Not on file  Social Connections: Not on file   Additional Social History:     Sleep: Poor  Appetite:  Good  Current Medications: Current Facility-Administered Medications  Medication Dose Route Frequency Provider Last Rate Last Admin  albuterol (VENTOLIN HFA) 108 (90 Base) MCG/ACT inhaler 2 puff  2 puff Inhalation Q6H PRN Massengill, Ovid Curd, MD       alum & mag hydroxide-simeth (MAALOX/MYLANTA) 200-200-20 MG/5ML suspension 30 mL  30 mL Oral Q4H PRN Corky Sox, MD   30 mL at 01/07/22 9528   Asenapine PT24 3.8 mg  3.8 mg Transdermal Daily Massengill, Ovid Curd, MD   3.8 mg at 01/07/22 0802   fluticasone furoate-vilanterol (BREO ELLIPTA) 200-25 MCG/ACT 1 puff  1 puff Inhalation Daily Massengill, Ovid Curd, MD   1 puff at 01/07/22 4132   hydrOXYzine (ATARAX) tablet 25 mg  25 mg Oral TID PRN Corky Sox, MD   25 mg at 01/07/22 4401   ibuprofen (ADVIL) tablet 200 mg  200 mg Oral Daily PRN Bobbitt, Shalon E, NP   200 mg at 01/06/22 1603   LORazepam (ATIVAN) tablet 2 mg  2 mg Oral Q6H PRN Massengill, Ovid Curd, MD       Or   LORazepam (ATIVAN) injection 2 mg  2 mg Intramuscular Q6H PRN Massengill, Ovid Curd, MD       magnesium hydroxide (MILK OF MAGNESIA) suspension 30 mL  30 mL Oral Daily PRN Corky Sox, MD       metoprolol succinate (TOPROL-XL) 24 hr tablet 12.5 mg  12.5 mg Oral Daily Massengill, Nathan, MD   12.5 mg at 01/07/22 0806   OLANZapine (ZYPREXA) injection 10 mg  10 mg Intramuscular BID PRN Massengill, Ovid Curd, MD       OLANZapine zydis (ZYPREXA)  disintegrating tablet 10 mg  10 mg Oral BID PRN Massengill, Ovid Curd, MD   10 mg at 01/06/22 1440   OLANZapine zydis (ZYPREXA) disintegrating tablet 2.5 mg  2.5 mg Oral Daily Massengill, Nathan, MD   2.5 mg at 01/07/22 0808   OLANZapine zydis (ZYPREXA) disintegrating tablet 5 mg  5 mg Oral QHS Massengill, Nathan, MD   5 mg at 01/06/22 2040   penicillin v potassium (VEETID) tablet 500 mg  500 mg Oral Q6H Attiah, Nadir, MD   500 mg at 01/07/22 1200   traZODone (DESYREL) tablet 100 mg  100 mg Oral QHS PRN Nicholes Rough, NP   100 mg at 01/06/22 2040    Lab Results: No results found for this or any previous visit (from the past 64 hour(s)).  Blood Alcohol level:  Lab Results  Component Value Date   ETH <10 10/29/2021   ETH <10 02/72/5366    Metabolic Disorder Labs: Lab Results  Component Value Date   HGBA1C 5.0 10/07/2021   MPG 96.8 10/07/2021   MPG 96.8 09/02/2021   Lab Results  Component Value Date   PROLACTIN 66.2 (H) 03/02/2021   PROLACTIN 66.1 (H) 02/04/2020   Lab Results  Component Value Date   CHOL 175 12/10/2021   TRIG 145 12/10/2021   HDL 44 12/10/2021   CHOLHDL 4.0 12/10/2021   VLDL 29 12/10/2021   LDLCALC 102 (H) 12/10/2021   LDLCALC 74 10/07/2021   Physical Findings: AIMS: 0 CIWA:  n/a COWS:  n/a   Musculoskeletal: Strength & Muscle Tone: within normal limits Gait & Station: normal Patient leans: N/A  Psychiatric Specialty Exam:  Presentation  General Appearance:  Disheveled  Eye Contact: Poor; Fleeting  Speech: Other (comment) (inconsistent)  Speech Volume: Normal  Handedness: Right   Mood and Affect  Mood: Dysphoric; Irritable  Affect: Labile; Restricted   Thought Process  Thought Processes: Other (comment)  Descriptions of Associations:Intact  Orientation:Partial  Thought Content:Illogical  History of Schizophrenia/Schizoaffective disorder:Yes  Duration of Psychotic Symptoms:Greater than six  months  Hallucinations:Hallucinations: Auditory Description of Auditory Hallucinations: "a little more today"   Ideas of Reference:None  Suicidal Thoughts:Suicidal Thoughts: No  Homicidal Thoughts:Homicidal Thoughts: No  Sensorium  Memory: Immediate Good; Recent Good  Judgment: Fair  Insight: Fair  Materials engineer: Fair  Attention Span: Fair  Recall: [place] [address] of Knowledge: Fair  Language: Fair  Psychomotor Activity  Psychomotor Activity: Psychomotor Activity: Normal  Assets  Assets: Physical Health; Resilience; Housing; Social Support  Sleep  Sleep: Sleep: Good Number of Hours of Sleep: 7.25  Physical Exam: Physical Exam Vitals and nursing note reviewed.  Constitutional:      Appearance: Normal appearance.  HENT:     Head: Normocephalic.     Nose: Nose normal. No congestion or rhinorrhea.  Eyes:     Pupils: Pupils are equal, round, and reactive to light.  Cardiovascular:     Rate and Rhythm: Tachycardia present.  Pulmonary:     Effort: Pulmonary effort is normal.  Abdominal:     Palpations: Abdomen is soft.  Musculoskeletal:        General: Normal range of motion.     Cervical back: Normal range of motion.  Skin:    General: Skin is warm and dry.  Neurological:     General: No focal deficit present.     Mental Status: She is alert.  Psychiatric:        Attention and Perception: She perceives auditory hallucinations. She does not perceive visual hallucinations.        Mood and Affect: Affect is labile, blunt and inappropriate.        Speech: Speech normal.        Behavior: Behavior is withdrawn.        Thought Content: Thought content is not paranoid or delusional. Thought content does not include homicidal or suicidal ideation. Thought content does not include homicidal or suicidal plan.        Judgment: Judgment is impulsive.    Review of Systems  Constitutional:  Negative for fever.  HENT:  Negative for  hearing loss.   Eyes: Negative.   Respiratory: Negative.    Gastrointestinal:  Negative for heartburn.  Musculoskeletal: Negative.   Skin:  Negative for rash.  Neurological: Negative.   Psychiatric/Behavioral:  Positive for depression and hallucinations (paranoia). Negative for memory loss, substance abuse and suicidal ideas. The patient is nervous/anxious. The patient does not have insomnia.    Blood pressure (!) 95/43, pulse (!) 113, temperature 98.1 F (36.7 C), temperature source Oral, resp. rate 20, height '5\' 4"'$  (1.626 m), weight 88.9 kg, SpO2 99 %. Body mass index is 33.64 kg/m.  Treatment Plan Summary: Daily contact with patient to assess and evaluate symptoms and progress in treatment and Medication management   Observation Level/Precautions:  15 minute checks  Laboratory:  Labs reviewed   Psychotherapy:  Unit Group sessions  Medications:  See [facility]  Consultations:  To be determined   Discharge Concerns:  Safety, medication compliance, mood stability  Estimated LOS: 5-7 days  Other:  N/A    Labs Reviewed on 01/04/2022: CMP& CBC reviewed. Beta hcg negative, Tox screen negative, UA with small leukocytes and rare bacteria, pt asymptomatic for a UTI. TSH WNL, Lipid panel WNL, HA1C from 10/07/21 WNL. QTC from 11/27 with QTC slightly prolonged at 456. Repeat EKG ordered and pending.    PLAN Safety and Monitoring: Involuntary admission to inpatient psychiatric unit for safety, stabilization and treatment  Daily contact with patient to assess and evaluate symptoms and progress in treatment Patient's case to be discussed in multi-disciplinary team meeting Observation Level : q15 minute checks Vital signs: q12 hours Precautions: Safety No Roommate d/t psychosis and aggressive behaviors   Long Term Goal(s): Improvement in symptoms so as ready for discharge   Short Term Goals: Ability to identify changes in lifestyle to reduce recurrence of condition will improve, Ability to verbalize  feelings will improve, Ability to disclose and discuss suicidal ideas, Ability to demonstrate self-control will improve, Ability to identify and develop effective coping behaviors will improve, and Compliance with prescribed medications will improve   Physician Treatment Plan for Secondary Diagnosis:  Principal Problem:   Schizoaffective disorder, bipolar type ([place]) Active Problems:   Borderline personality disorder ([facility])   PTSD (post-traumatic stress disorder)   Asthma   Tachycardia   GAD (generalized anxiety disorder)   Self-injurious behavior   Fibromyalgia   Medications Schizoaffective disorder, bipolar type -Continue:    - Saphris patch 3.8 mg for psychosis & mood (patch is a safe choice for pt currently d/t h/o multiple  overdoses on PO medications. QTC with QTC-456, repeat EKG ordered)    - Zyprexa 2.5 mg daily, 5 mg PO HS for psychosis/mood. Plan to continue to decrease medication. Patient  continues to utilize PRN doses consistently  Dental Infection:   - Continue:      - Penicillin v potassium (VEETID) tablet 500 mg BID  Hypertension: - Continue:    - Toprol XL 12.5 mg daily for tachycardia on 01/04/22  Asthma: -Continue: - Breo Ellipta inhaler daily for asthma  Recent Medication Trials:  -Discontinued Seroquel 25 mg BID on 11/29 -Discontinued Inderal 5 mg Q 12 H for tachycardia on 11/29.   Other PRNS -Continue:  - Tylenol 650 mg every 6 hours PRN for mild pain - Maalox 30 mg every 4 hrs PRN for indigestion - Milk of Magnesia as needed every 6 hrs for constipation   - Albuterol PRN for SOB/Wheezing - Ativan 2 mg  PO/IM for agitation PRN for agitation - Zyprexa 5 mg BID PRN, 10 mg IM PRN BID for agitation - Hydroxyzine 25 mg TID PRN for anxiety - Trazodone to 100 mg nightly PRN for sleep   Discharge Planning: Social work and case management to assist with discharge planning and identification of hospital follow-up needs prior to discharge Estimated LOS:  5-7 days Discharge Concerns: Need to establish a safety plan; Medication compliance and effectiveness Discharge Goals: Return home with outpatient referrals for mental health follow-up including medication management/psychotherapy  Inda Merlin, NP 01/07/2022, 2:17 PMPatient ID: [name], female   DOB: 05/01/1990, 31 y.o.   MRN: [id number]

## 2022-01-07 NOTE — Plan of Care (Signed)
  Problem: Education: Goal: Knowledge of [place] [facility] Education information/materials will improve Outcome: Progressing Goal: Emotional status will improve Outcome: Progressing Goal: Mental status will improve Outcome: Progressing Goal: Verbalization of understanding the information provided will improve Outcome: Progressing   Problem: Activity: Goal: Interest or engagement in activities will improve Outcome: Progressing Goal: Sleeping patterns will improve Outcome: Progressing   Problem: Coping: Goal: Ability to verbalize frustrations and anger appropriately will improve Outcome: Progressing Goal: Ability to demonstrate self-control will improve Outcome: Progressing   Problem: Health Behavior/Discharge Planning: Goal: Identification of resources available to assist in meeting health care needs will improve Outcome: Progressing Goal: Compliance with treatment plan for underlying cause of condition will improve Outcome: Progressing   Problem: Safety: Goal: Periods of time without injury will increase Outcome: Progressing   Problem: Self-Concept: Goal: Will verbalize positive feelings about self Outcome: Progressing

## 2022-01-07 NOTE — Progress Notes (Signed)
Patient has been in bed most of the shift, but did come up and take her medications late. Denies current SI/HI. Denies AVH at present. States her tooth pain is better since the antibiotics were started yesterday. Cooperative with staff. Did not go to lunch today, stayed in her room and slept. She has not participated in groups either. Will continue to encourage pt to participate in groups and participate in the milleu.

## 2022-01-07 NOTE — Progress Notes (Signed)
Adult Psychoeducational Group Note   Date:  01/06/2022 Time:  9:30AM   Group Topic/Focus:   Goals Group: The purpose of this group is to identify daily goals for the day and discuss the treatment plan for the day.   Participation Level:  Did Not Attend   Participation Quality:  Did Not Attend   Affect:  Did Not Attend   Cognitive:  Did Not Attend   Insight: Did Not Attend   Engagement in Group:  Did Not Attend   Modes of Intervention:  Did Not Attend   Additional Comments:   Pt was encouraged but refused to attend group discussion.   Luanna Salk, RN 01/06/22 9:30AM

## 2022-01-07 NOTE — [facility] Group Notes (Signed)
Adult Psychoeducational Group Note Date:  01/07/2022 Time:  0900-1000 Group Topic/Focus: PROGRESSIVE RELAXATION. A group where deep breathing is taught and tensing and relaxation muscle groups is used. Imagery is used as well.  Pts are asked to imagine 3 pillars that hold them up when they are not able to hold themselves up and to share that with the group.   Participation Level:  did not attend   : [name]

## 2022-01-08 ENCOUNTER — Other Ambulatory Visit (HOSPITAL_COMMUNITY): Payer: Self-pay

## 2022-01-08 MED ORDER — PANTOPRAZOLE SODIUM 40 MG PO TBEC
40.0000 mg | DELAYED_RELEASE_TABLET | Freq: Every day | ORAL | Status: DC
  Administered 2022-01-08 – 2022-01-10 (×3): 40 mg via ORAL
  Filled 2022-01-08 (×6): qty 1

## 2022-01-08 MED ORDER — TRAZODONE HCL 100 MG PO TABS
50.0000 mg | ORAL_TABLET | Freq: Every evening | ORAL | Status: DC | PRN
  Filled 2022-01-08: qty 1

## 2022-01-08 MED ORDER — MELATONIN 3 MG PO TABS
3.0000 mg | ORAL_TABLET | Freq: Every day | ORAL | Status: DC
  Administered 2022-01-08 – 2022-01-10 (×3): 3 mg via ORAL
  Filled 2022-01-08 (×4): qty 1

## 2022-01-08 MED ORDER — OLANZAPINE 2.5 MG PO TABS
2.5000 mg | ORAL_TABLET | ORAL | Status: DC
  Administered 2022-01-08 – 2022-01-09 (×2): 2.5 mg via ORAL
  Filled 2022-01-08 (×4): qty 1

## 2022-01-08 MED ORDER — OLANZAPINE 5 MG PO TBDP
2.5000 mg | ORAL_TABLET | Freq: Every day | ORAL | Status: DC
  Filled 2022-01-08: qty 0.5

## 2022-01-08 NOTE — Group Note (Signed)
LCSW Group Therapy Note   Group Date: 01/08/2022 Start Time: 1300 End Time: 1400   Type of Therapy and Topic:  Group Therapy: Relationship Check-Ins  Participation Level:  Active   Description of Group:    In this group patients will be encouraged to explore how individuals communicate with one another appropriately and inappropriately. Patients will be guided to discuss their thoughts, feelings, and behaviors related to barriers communicating feelings, needs, and stressors. The group will process together ways to execute positive and appropriate communications, with attention given to how one use behavior, tone, and body language to communicate. Patient will be encouraged to reflect on an incident where they were successfully able to communicate and the factors that they believe helped them to communicate. Each patient will be encouraged to identify specific changes they are motivated to make in order to overcome communication barriers with self, peers, authority, and parents. This group will be process-oriented, with patients participating in exploration of their own experiences as well as giving and receiving support and challenging self as well as other group members.  Therapeutic Goals: Patient will identify how people communicate (body language, facial expression, and electronics) Also discuss tone, voice and how these impact what is communicated and how the message is perceived.  Patient will identify feelings (such as fear or worry), thought process and behaviors related to why people internalize feelings rather than express self openly. Patient will identify two changes they are willing to make to overcome communication barriers. Members will then practice through Role Play how to communicate by utilizing psycho-education material (such as I Feel statements and acknowledging feelings rather than displacing on others)   Summary of Patient Progress Patient was present and active in  group. Patient followed along with provided worksheet and gave her opinion on the different relationship check-ins. Patient insight was good, very informative, positive, and respectful. Patient did give examples on how she would change and better her relationship check-ins. Patient was encouraging her peers and open to others encouragement about her check-in ratings.    Therapeutic Modalities:   Cognitive Behavioral Therapy Solution Focused Therapy Motivational Interviewing Family Systems Approach   Sherre Lain, [place] 01/08/2022  2:27 PM

## 2022-01-08 NOTE — Group Note (Signed)
Recreation Therapy Group Note   Group Topic:Coping Skills  Group Date: 01/08/2022 Start Time: 1000 End Time: 1035 Facilitators: Roshawn Ayala-McCall, LRT,CTRS Location: [address] Dayroom  Goal Area(s) Addresses:  Patient will identify positive stress management techniques. Patient will identify benefits of using stress management post d/c.  Group Description:  Mind Map.  Patient was provided a blank template of a diagram with 32 blank boxes in a tiered system, branching from the center (similar to a bubble chart). LRT directed patients to label the middle of the diagram "Coping Skills".  Patients identified anger, depression, stress, anxiety, boredom, substance abuse, fear and paranoia as situations in which coping skills can be used.  Pt were directed to record their coping skills in the 2nd tier boxes. Patients were to then come up with 3 effective techniques to address each identified area.     Affect/Mood: Appropriate and Anxious   Participation Level: Engaged   Participation Quality: Independent   Behavior: Attentive  and Restless   Speech/Thought Process: Focused   Insight: Good   Judgement: Good   Modes of Intervention: Worksheet   Patient Response to Interventions:  Engaged   Education Outcome:  Acknowledges education and In group clarification offered    Clinical Observations/Individualized Feedback: Pt was engaged in group but seemed anxious and restless at times.  Pt would talk fast when speaking but was focused on the topic.  Pt identified some coping skills as loud music, scream, write down what's upsetting you, guided imagery, window shop on the internet, do something for someone else, bubble bath, yoga, talk to someone, count to 10, epsom salt bath, walk outside, read bible, diamond painting, word search, dot to dot, eat sweets, burn incense, pray, go to church and be around others.     Plan: Continue to engage patient in RT group sessions  2-3x/week.   Payton Moder-McCall, LRT,CTRS 01/08/2022 12:18 PM

## 2022-01-08 NOTE — Progress Notes (Signed)
Adult Psychoeducational Group Note  Date:  01/08/2022 Time:  9:09 PM  Group Topic/Focus:  Wrap-Up Group:   The focus of this group is to help patients review their daily goal of treatment and discuss progress on daily workbooks.  Participation Level:  Active  Participation Quality:  Appropriate  Affect:  Appropriate  Cognitive:  Appropriate  Insight: Appropriate  Engagement in Group:  Engaged  Modes of Intervention:  Discussion  Additional Comments:   Pt states she had a better day than yesterday because she received some good news about her d/c date. Pt did express some frustration with being fired from her job. Writer advised her to speak with her social worker about the issue. Pt denied everything.   Gerhard Perches 01/08/2022, 9:09 PM

## 2022-01-08 NOTE — Progress Notes (Signed)
[facility] MD Progress Note  01/08/2022 4:19 PM [name]  MRN:  [id number] Principal Problem: Schizoaffective disorder, bipolar type ([place]) Diagnosis: Principal Problem:   Schizoaffective disorder, bipolar type ([place]) Active Problems:   Borderline personality disorder ([place])   PTSD (post-traumatic stress disorder)   Asthma   GAD (generalized anxiety disorder)   Fibromyalgia  Reason For Admission: [name] is a 31 yo Caucasian female with prior mental health health diagnoses of borderline personality d/o & Schizoaffective d/o who was taken to the [facility] by [facility] [address] after she called the crises hotline number to report that she had overdosed on multiple medications in an attempt to end her life. As per ED assessment, "she took 6 '40mg'$  Zofran pills, 6 '25mg'$  Atenolol pills, 6 '10mg'$  Cyclobenzapr pills, 18 '5mg'$  escitalopram pills, 18 '10mg'$  Hydroxyz pills, 6 '40mg'$  pantroprazole pills, 6 '1mg'$  Prazosin hcl pills, 18 '50mg'$  pregabalin pills, 3 '25mg'$  Quetiapine pills, and 6 1.'5mg'$  vraylar pills."  Patient was involuntarily committed and transferred to this behavioral health Hospital for treatment and stabilization of her mood.   24 hr chart review: BP WNL for past 24 hrs, HR was 10 earlier today morning. Pt has been compliant with medications, has not required any agitation protocol or anti anxiety medications in the past 24 hrs. As per nursing documentation, pt has been circumstantial in her responses to questions, has been isolative, and blamed others for her symptoms in the past 24 hrs. Earlier today, pt was visible in the milieu, was more coherent, and was attending group sessions, and able to participate and engage with other patients, but was restless.  Patient assessment note: Mood today is less irritable & less depressed. Affect is congruent. Pt's attention to personal hygiene and grooming is fair unlike last week. She is more coherent and more logical with her responses to questions. Eye contact is good,  speech is clear and coherent. Pt denies SI/HI/AVH. She denies paranoia, denies other delusional thoughts. She denies being in any physical distress and states that her sleep last night was "excellent", and reports a good appetite.   Pt denies any medication related side effects, No TD/EPS type symptoms found on assessment, and pt denies any feelings of stiffness. AIMS: 0. We are continuing medications as listed below.  Total Time spent with patient: 45 minutes  Past Psychiatric History: Schizoaffective d/o  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 fragile X syndrome    Chronic constipation    Depression    Drug-seeking behavior    Essential tremor    Headache    Overdose of acetaminophen 07/2017   and other meds   Personality disorder ([place])    Schizo-affective psychosis ([place])    Schizoaffective disorder, bipolar type ([place])    Seizures ([place])    Last seizure December 2017   Sleep apnea     Past Surgical History:  Procedure Laterality Date   MOUTH SURGERY  2009 or 2010   Family History:  Family History  Problem Relation Age of Onset   Mental illness Father    Asthma Father    PDD Brother    Seizures Brother    Family Psychiatric  History: As above Social History:  Social History   Substance and Sexual Activity  Alcohol Use No   Alcohol/week: 1.0 standard drink of alcohol   Types:  1 Standard drinks or equivalent per week   Comment: denies at this time     Social History   Substance and Sexual Activity  Drug Use No   Comment: History of cocaine use at age 76 for 4 months    Social History   Socioeconomic History   Marital status: Widowed    Spouse name: Not on file   Number of children: 0   Years of education: Not on file   Highest education level: Not on file  Occupational History   Occupation: disability  Tobacco Use   Smoking status: Former     Packs/day: 0.00    Types: Cigarettes   Smokeless tobacco: Never   Tobacco comments:    Smoked for 2  years age [age]  Vaping Use   Vaping Use: Never used  Substance and Sexual Activity   Alcohol use: No    Alcohol/week: 1.0 standard drink of alcohol    Types: 1 Standard drinks or equivalent per week    Comment: denies at this time   Drug use: No    Comment: History of cocaine use at age 79 for 4 months   Sexual activity: Not Currently    Birth control/protection: None  Other Topics Concern   Not on file  Social History Narrative   Marital status: Widowed      Children: daughter      Lives: with boyfriend, in two story home      Employment:  Disability      Tobacco: quit smoking; smoked for two years.      Alcohol ;none      Drugs: none   Has not traveled outside of the country.   Right handed         Social Determinants of Health   Financial Resource Strain: Not on file  Food Insecurity: No Food Insecurity (01/01/2022)   Hunger Vital Sign    Worried About Running Out of Food in the Last Year: Never true    Ran Out of Food in the Last Year: Never true  Recent Concern: Food Insecurity - Food Insecurity Present (12/28/2021)   Hunger Vital Sign    Worried About Running Out of Food in the Last Year: Often true    Ran Out of Food in the Last Year: Often true  Transportation Needs: Unmet Transportation Needs (01/01/2022)   PRAPARE - Hydrologist (Medical): No    Lack of Transportation (Non-Medical): Yes  Physical Activity: Not on file  Stress: Not on file  Social Connections: Not on file   Additional Social History:     Sleep: Poor  Appetite:  Good  Current Medications: Current Facility-Administered Medications  Medication Dose Route Frequency Provider Last Rate Last Admin   albuterol (VENTOLIN HFA) 108 (90 Base) MCG/ACT inhaler 2 puff  2 puff Inhalation Q6H PRN Massengill, Ovid Curd, MD       alum & mag hydroxide-simeth  (MAALOX/MYLANTA) 200-200-20 MG/5ML suspension 30 mL  30 mL Oral Q4H PRN Corky Sox, MD   30 mL at 01/07/22 1507   Asenapine PT24 3.8 mg  3.8 mg Transdermal Daily Massengill, Nathan, MD   3.8 mg at 01/08/22 0803   fluticasone furoate-vilanterol (BREO ELLIPTA) 200-25 MCG/ACT 1 puff  1 puff Inhalation Daily Massengill, Ovid Curd, MD   1 puff at 01/08/22 0803   hydrOXYzine (ATARAX) tablet 25 mg  25 mg Oral TID PRN Corky Sox, MD   25 mg at 01/07/22 2037   ibuprofen (ADVIL)  tablet 200 mg  200 mg Oral Daily PRN Bobbitt, Shalon E, NP   200 mg at 01/06/22 1603   LORazepam (ATIVAN) tablet 2 mg  2 mg Oral Q6H PRN Massengill, Ovid Curd, MD       Or   LORazepam (ATIVAN) injection 2 mg  2 mg Intramuscular Q6H PRN Massengill, Ovid Curd, MD       magnesium hydroxide (MILK OF MAGNESIA) suspension 30 mL  30 mL Oral Daily PRN Corky Sox, MD       melatonin tablet 3 mg  3 mg Oral QHS Massengill, Nathan, MD       metoprolol succinate (TOPROL-XL) 24 hr tablet 12.5 mg  12.5 mg Oral Daily Massengill, Nathan, MD   12.5 mg at 01/08/22 0802   OLANZapine (ZYPREXA) injection 10 mg  10 mg Intramuscular BID PRN Massengill, Ovid Curd, MD       OLANZapine (ZYPREXA) tablet 2.5 mg  2.5 mg Oral BH-qamhs Massengill, Nathan, MD       OLANZapine zydis (ZYPREXA) disintegrating tablet 10 mg  10 mg Oral BID PRN Massengill, Ovid Curd, MD   10 mg at 01/06/22 1440   pantoprazole (PROTONIX) EC tablet 40 mg  40 mg Oral Daily Massengill, Ovid Curd, MD   40 mg at 01/08/22 1255   penicillin v potassium (VEETID) tablet 500 mg  500 mg Oral Q6H Attiah, Nadir, MD   500 mg at 01/08/22 1253   traZODone (DESYREL) tablet 50 mg  50 mg Oral QHS PRN Massengill, Ovid Curd, MD        Lab Results: No results found for this or any previous visit (from the past 48 hour(s)).  Blood Alcohol level:  Lab Results  Component Value Date   ETH <10 10/29/2021   ETH <10 57/84/6962    Metabolic Disorder Labs: Lab Results  Component Value Date   HGBA1C 5.0 10/07/2021    MPG 96.8 10/07/2021   MPG 96.8 09/02/2021   Lab Results  Component Value Date   PROLACTIN 66.2 (H) 03/02/2021   PROLACTIN 66.1 (H) 02/04/2020   Lab Results  Component Value Date   CHOL 175 12/10/2021   TRIG 145 12/10/2021   HDL 44 12/10/2021   CHOLHDL 4.0 12/10/2021   VLDL 29 12/10/2021   LDLCALC 102 (H) 12/10/2021   LDLCALC 74 10/07/2021   Physical Findings: AIMS: 0 CIWA:  n/a COWS:  n/a   Musculoskeletal: Strength & Muscle Tone: within normal limits Gait & Station: normal Patient leans: N/A  Psychiatric Specialty Exam:  Presentation  General Appearance:  Appropriate for Environment; Fairly Groomed  Eye Contact: Fair  Speech: Clear and Coherent  Speech Volume: Normal  Handedness: Right   Mood and Affect  Mood: Depressed; Anxious  Affect: Congruent   Thought Process  Thought Processes: Coherent  Descriptions of Associations:Intact  Orientation:Partial  Thought Content:Logical  History of Schizophrenia/Schizoaffective disorder:Yes  Duration of Psychotic Symptoms:Greater than six months  Hallucinations:Hallucinations: None Description of Auditory Hallucinations: "a little more today"   Ideas of Reference:Other (comment)  Suicidal Thoughts:Suicidal Thoughts: No  Homicidal Thoughts:Homicidal Thoughts: No  Sensorium  Memory: Immediate Good  Judgment: Fair  Insight: Fair   Community education officer  Concentration: Fair  Attention Span: Fair  Recall: [employer] of Knowledge: Fair  Language: Fair  Psychomotor Activity  Psychomotor Activity: Psychomotor Activity: Normal  Assets  Assets: Resilience  Sleep  Sleep: Sleep: Good  Physical Exam: Physical Exam Constitutional:      Appearance: Normal appearance.  HENT:     Head: Normocephalic.  Nose: Nose normal. No congestion or rhinorrhea.  Eyes:     Pupils: Pupils are equal, round, and reactive to light.  Pulmonary:     Effort: Pulmonary effort is  normal.  Musculoskeletal:        General: Normal range of motion.     Cervical back: Normal range of motion.  Neurological:     General: No focal deficit present.     Mental Status: She is alert.    Review of Systems  Constitutional:  Negative for fever.  HENT:  Negative for hearing loss.   Eyes: Negative.   Respiratory: Negative.    Cardiovascular:  Positive for palpitations.  Gastrointestinal:  Negative for heartburn.  Musculoskeletal: Negative.   Skin:  Negative for rash.  Neurological: Negative.   Psychiatric/Behavioral:  Positive for depression. Negative for hallucinations (paranoia), memory loss, substance abuse and suicidal ideas. The patient is nervous/anxious. The patient does not have insomnia.    Blood pressure 123/79, pulse (!) 104, temperature 98.4 F (36.9 C), temperature source Oral, resp. rate 16, height '5\' 4"'$  (1.626 m), weight 88.9 kg, SpO2 99 %. Body mass index is 33.64 kg/m.  Treatment Plan Summary: Daily contact with patient to assess and evaluate symptoms and progress in treatment and Medication management   Observation Level/Precautions:  15 minute checks  Laboratory:  Labs reviewed   Psychotherapy:  Unit Group sessions  Medications:  See [facility]  Consultations:  To be determined   Discharge Concerns:  Safety, medication compliance, mood stability  Estimated LOS: 5-7 days  Other:  N/A    Labs Reviewed on 01/04/2022: CMP& CBC reviewed. Beta hcg negative, Tox screen negative, UA with small leukocytes and rare bacteria, pt asymptomatic for a UTI. TSH WNL, Lipid panel WNL, HA1C from 10/07/21 WNL. QTC from 11/27 with QTC slightly prolonged at 456. Repeat EKG ordered and pending.    PLAN Safety and Monitoring: Involuntary admission to inpatient psychiatric unit for safety, stabilization and treatment Daily contact with patient to assess and evaluate symptoms and progress in treatment Patient's case to be discussed in multi-disciplinary team meeting Observation  Level : q15 minute checks Vital signs: q12 hours Precautions: Safety No Roommate d/t psychosis and aggressive behaviors   Long Term Goal(s): Improvement in symptoms so as ready for discharge   Short Term Goals: Ability to identify changes in lifestyle to reduce recurrence of condition will improve, Ability to verbalize feelings will improve, Ability to disclose and discuss suicidal ideas, Ability to demonstrate self-control will improve, Ability to identify and develop effective coping behaviors will improve, and Compliance with prescribed medications will improve   Physician Treatment Plan for Secondary Diagnosis:  Principal Problem:   Schizoaffective disorder, bipolar type ([place]) Active Problems:   Borderline personality disorder ([facility])   PTSD (post-traumatic stress disorder)   Asthma   Tachycardia   GAD (generalized anxiety disorder)   Self-injurious behavior   Fibromyalgia   Medications -Continue Saphris patch 3.8 mg for psychosis & mood (patch is a safe choice for pt currently d/t h/o multiple overdoses on PO medications. QTC with QTC-456, repeat EKG ordered) -Decrease Zyprexa to 2.5 mg PO Q12 H for psychosis/mood -Start Protonix 40 mg daily for GERD -Start Melatonin 3 mg nightly for insomnia -Continue Hydroxyzine 25 mg TID PRN for anxiety -Decrease Trazodone to 50 mg nightly PRN for sleep -Continue Toprol XL 12.5 mg daily for tachycardia on 01/04/22 -Continue Breo Ellipta inhaler daily for asthma -Continue Albuterol PRN for SOB/Wheezing -Continue Ativan 2 mg  PO/IM for agitation  PRN for agitation -Continue  Zyprexa 10 mg IM PRN BID for agitation -Continue Penicillin V Potassium 500 mg BID for dental infection (will ascertain with pharmacist duration of abx therapy) -Discontinued Seroquel 25 mg BID on 11/29 -Discontinued Inderal 5 mg Q 12 H for tachycardia on 11/29.   Other PRNS -Continue Tylenol 650 mg every 6 hours PRN for mild pain -Continue Maalox 30 mg every 4 hrs PRN  for indigestion -Continue Milk of Magnesia as needed every 6 hrs for constipation   Discharge Planning: Social work and case management to assist with discharge planning and identification of hospital follow-up needs prior to discharge Estimated LOS: 5-7 days Discharge Concerns: Need to establish a safety plan; Medication compliance and effectiveness Discharge Goals: Return home with outpatient referrals for mental health follow-up including medication management/psychotherapy  Nicholes Rough, NP 01/08/2022, 4:19 PMPatient ID: [name], female   DOB: [birth year], 31 y.o.   MRN: [id number] Patient ID: [name], female   DOB: 05-24-1990, 31 y.o.   MRN: [id number]

## 2022-01-08 NOTE — Progress Notes (Signed)
   01/08/22 2100  Psych Admission Type (Psych Patients Only)  Admission Status Voluntary  Psychosocial Assessment  Patient Complaints Worrying  Eye Contact Fair  Facial Expression Anxious;Sad  Affect Appropriate to circumstance  Speech Logical/coherent  Interaction Assertive  Motor Activity Slow  Appearance/Hygiene Disheveled  Behavior Characteristics Cooperative  Mood Labile;Preoccupied  Aggressive Behavior  Effect No apparent injury  Thought Process  Coherency Circumstantial  Content Blaming others  Delusions None reported or observed  Perception Hallucinations  Hallucination Auditory  Judgment Impaired  Confusion None  Danger to Self  Current suicidal ideation? Denies  Danger to Others  Danger to Others None reported or observed  Danger to Others Abnormal  Harmful Behavior to others No threats or harm toward other people

## 2022-01-08 NOTE — B[facility] Counselor (Signed)
CSW spoke with Dr. Caswell Corwin and pt  to request an order for pt to retrieve telephone numbers from her cell phone . Pt indicates that she needs to contact her employer at [employer]" and also states that she needs to also speak with her Landlord and to pay her bills. Dr. Caswell Corwin wrote the order. Will continue to monitor and provide assistance and support as needed.

## 2022-01-09 MED ORDER — ONDANSETRON HCL 4 MG PO TABS
4.0000 mg | ORAL_TABLET | Freq: Three times a day (TID) | ORAL | Status: DC | PRN
  Administered 2022-01-09 – 2022-01-10 (×2): 4 mg via ORAL
  Filled 2022-01-09: qty 1

## 2022-01-09 MED ORDER — OLANZAPINE 2.5 MG PO TABS
2.5000 mg | ORAL_TABLET | Freq: Every day | ORAL | Status: DC
  Filled 2022-01-09 (×2): qty 1

## 2022-01-09 NOTE — Group Note (Signed)
Recreation Therapy Group Note   Group Topic:Health and Wellness  Group Date: 01/09/2022 Start Time: 1000 End Time: 1035 Facilitators: Kahlen Boyde-McCall, LRT,CTRS Location: [address] Dayroom   Goal Area(s) Addresses:  Patient will define components of whole wellness. Patient will verbalize benefit of whole wellness.   Group Description:  Exercise.  LRT discussed the importance of physical health with patients.  Patients were then instructed group would consist of them taking turns leading the group in exercises of their choosing.  LRT informed patients to give at least 30 good minutes of movement.  LRT also encouraged patients to drink water as needed and to take breaks if needed.   Affect/Mood: Depressed   Participation Level: None   Participation Quality: None   Behavior: Tearful   Speech/Thought Process: Distracted and Preoccupied   Insight: N/A   Judgement: N/A   Modes of Intervention: Activity   Patient Response to Interventions:  Disengaged   Education Outcome:  Acknowledges education and In group clarification offered    Clinical Observations/Individualized Feedback: Pt came in for the last few minutes of group.  Pt was sitting, upset and tearful.    Plan: Continue to engage patient in RT group sessions 2-3x/week.   Jevaughn Degollado-McCall, LRT,CTRS 01/09/2022 11:54 AM

## 2022-01-09 NOTE — [facility] Group Notes (Signed)
Adult Psychoeducational Group Note  Date:  01/09/2022 Time:  9:00 PM  Group Topic/Focus:  Wrap-Up Group:   The focus of this group is to help patients review their daily goal of treatment and discuss progress on daily workbooks.  Participation Level:  Active  Participation Quality:  Attentive  Affect:  Appropriate  Cognitive:  Appropriate  Insight: Appropriate  Engagement in Group:  Engaged  Modes of Intervention:  Discussion  Additional Comments:  Patient attended and participated in the [place] group.  Annie Sable 01/09/2022, 9:00 PM

## 2022-01-09 NOTE — Progress Notes (Signed)

## 2022-01-09 NOTE — Progress Notes (Signed)
   01/09/22 0500  Sleep  Number of Hours 7

## 2022-01-09 NOTE — Progress Notes (Signed)
[facility] MD Progress Note  01/09/2022 4:07 PM [name]  MRN:  [id number] Principal Problem: Schizoaffective disorder, bipolar type ([place]) Diagnosis: Principal Problem:   Schizoaffective disorder, bipolar type ([place]) Active Problems:   Borderline personality disorder ([place])   PTSD (post-traumatic stress disorder)   Asthma   GAD (generalized anxiety disorder)   Fibromyalgia  Reason For Admission: [name] is a 31 yo Caucasian female with prior mental health health diagnoses of borderline personality d/o & Schizoaffective d/o who was taken to the [facility] by [facility] after she called the crises hotline number to report that she had overdosed on multiple medications in an attempt to end her life. As per ED assessment, "she took 6 '40mg'$  Zofran pills, 6 '25mg'$  Atenolol pills, 6 '10mg'$  Cyclobenzapr pills, 18 '5mg'$  escitalopram pills, 18 '10mg'$  Hydroxyz pills, 6 '40mg'$  pantroprazole pills, 6 '1mg'$  Prazosin hcl pills, 18 '50mg'$  pregabalin pills, 3 '25mg'$  Quetiapine pills, and 6 1.'5mg'$  vraylar pills."  Patient was involuntarily committed and transferred to this behavioral health Hospital for treatment and stabilization of her mood.   24 hr chart review: BP earlier today morning with DBP elevated at 108. HR also slightly elevated at 103. Pt slept for a total of 7 hrs last night as per nursing flow sheets. She has been compliant with scheduled medications, and required PRN hydroxyzine 25 mg for anxiety and Zofran 4 mg for nausea earlier today morning. Pt has attended some unit group sessions over the past 24 hrs and has actively participated.  Patient assessment note: Mood was irritable earlier today morning, but pt was less irritable as afternoon approached. Pt continues to endorse +AH, but states that the voices are lower than they were yesterday, and they are mumbling. She states that she can tell that the voices are those of her brother and her father, but is unable to comprehend what they are saying. She denies SI,  denies HI, denies VH or other delusional thoughts.  Pt reports that Melatonin last night was helpful for sleep, and her sleep quality was good. She reports a good appetite, but stated that she was nauseous after breakfast, but nausea had resolved at time of this assessment. Pt denies any medication related side effects, No TD/EPS type symptoms found on assessment, and pt denies any feelings of stiffness. AIMS: 0. Plan continues to be to get pt's to the minimal amounts and strengths of medications necessary to stabilize her mood prior to discharge. We will therefore, decrease Zyprexa to 2.5 mg x two doses, then stop it. We will continue Saphris patch as well as other medications as listed below.   Total Time spent with patient: 45 minutes  Past Psychiatric History: Schizoaffective d/o  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 fragile X syndrome    Chronic constipation    Depression    Drug-seeking behavior    Essential tremor    Headache    Overdose of acetaminophen 07/2017   and other meds   Personality disorder ([place])    Schizo-affective psychosis ([place])    Schizoaffective disorder, bipolar type ([place])    Seizures ([place])    Last seizure December 2017   Sleep apnea     Past Surgical History:  Procedure Laterality Date   MOUTH SURGERY  2009 or 2010   Family History:  Family History  Problem  Relation Age of Onset   Mental illness Father    Asthma Father    PDD Brother    Seizures Brother    Family Psychiatric  History: As above Social History:  Social History   Substance and Sexual Activity  Alcohol Use No   Alcohol/week: 1.0 standard drink of alcohol   Types: 1 Standard drinks or equivalent per week   Comment: denies at this time     Social History   Substance and Sexual Activity  Drug Use No   Comment: History of cocaine use at age 27 for 4 months     Social History   Socioeconomic History   Marital status: Widowed    Spouse name: Not on file   Number of children: 0   Years of education: Not on file   Highest education level: Not on file  Occupational History   Occupation: disability  Tobacco Use   Smoking status: Former    Packs/day: 0.00    Types: Cigarettes   Smokeless tobacco: Never   Tobacco comments:    Smoked for 2  years age [age]  Vaping Use   Vaping Use: Never used  Substance and Sexual Activity   Alcohol use: No    Alcohol/week: 1.0 standard drink of alcohol    Types: 1 Standard drinks or equivalent per week    Comment: denies at this time   Drug use: No    Comment: History of cocaine use at age 50 for 4 months   Sexual activity: Not Currently    Birth control/protection: None  Other Topics Concern   Not on file  Social History Narrative   Marital status: Widowed      Children: daughter      Lives: with boyfriend, in two story home      Employment:  Disability      Tobacco: quit smoking; smoked for two years.      Alcohol ;none      Drugs: none   Has not traveled outside of the country.   Right handed         Social Determinants of Health   Financial Resource Strain: Not on file  Food Insecurity: No Food Insecurity (01/01/2022)   Hunger Vital Sign    Worried About Running Out of Food in the Last Year: Never true    Ran Out of Food in the Last Year: Never true  Recent Concern: Food Insecurity - Food Insecurity Present (12/28/2021)   Hunger Vital Sign    Worried About Charity fundraiser in the Last Year: Often true    Ran Out of Food in the Last Year: Often true  Transportation Needs: Unmet Transportation Needs (01/01/2022)   PRAPARE - Hydrologist (Medical): No    Lack of Transportation (Non-Medical): Yes  Physical Activity: Not on file  Stress: Not on file  Social Connections: Not on file   Additional Social History:     Sleep: Poor  Appetite:   Good  Current Medications: Current Facility-Administered Medications  Medication Dose Route Frequency Provider Last Rate Last Admin   albuterol (VENTOLIN HFA) 108 (90 Base) MCG/ACT inhaler 2 puff  2 puff Inhalation Q6H PRN Massengill, Ovid Curd, MD       alum & mag hydroxide-simeth (MAALOX/MYLANTA) 200-200-20 MG/5ML suspension 30 mL  30 mL Oral Q4H PRN Corky Sox, MD   30 mL at 01/07/22 1507   Asenapine PT24 3.8 mg  3.8 mg Transdermal Daily Massengill, Ovid Curd, MD  3.8 mg at 01/09/22 1122   fluticasone furoate-vilanterol (BREO ELLIPTA) 200-25 MCG/ACT 1 puff  1 puff Inhalation Daily Massengill, Ovid Curd, MD   1 puff at 01/09/22 0736   hydrOXYzine (ATARAX) tablet 25 mg  25 mg Oral TID PRN Corky Sox, MD   25 mg at 01/09/22 1053   ibuprofen (ADVIL) tablet 200 mg  200 mg Oral Daily PRN Bobbitt, Shalon E, NP   200 mg at 01/09/22 0606   LORazepam (ATIVAN) tablet 2 mg  2 mg Oral Q6H PRN Massengill, Ovid Curd, MD       Or   LORazepam (ATIVAN) injection 2 mg  2 mg Intramuscular Q6H PRN Massengill, Ovid Curd, MD       magnesium hydroxide (MILK OF MAGNESIA) suspension 30 mL  30 mL Oral Daily PRN Corky Sox, MD       melatonin tablet 3 mg  3 mg Oral QHS Massengill, Ovid Curd, MD   3 mg at 01/08/22 2037   metoprolol succinate (TOPROL-XL) 24 hr tablet 12.5 mg  12.5 mg Oral Daily Massengill, Ovid Curd, MD   12.5 mg at 01/09/22 0736   OLANZapine (ZYPREXA) injection 10 mg  10 mg Intramuscular BID PRN Massengill, Ovid Curd, MD       Derrill Memo ON 01/10/2022] OLANZapine (ZYPREXA) tablet 2.5 mg  2.5 mg Oral QHS Massengill, Nathan, MD       OLANZapine zydis (ZYPREXA) disintegrating tablet 10 mg  10 mg Oral BID PRN Massengill, Ovid Curd, MD   10 mg at 01/06/22 1440   ondansetron (ZOFRAN) tablet 4 mg  4 mg Oral Q8H PRN Massengill, Ovid Curd, MD   4 mg at 01/09/22 1053   pantoprazole (PROTONIX) EC tablet 40 mg  40 mg Oral Daily Massengill, Ovid Curd, MD   40 mg at 01/09/22 0736   penicillin v potassium (VEETID) tablet 500 mg  500 mg  Oral Q6H Attiah, Nadir, MD   500 mg at 01/09/22 1124   traZODone (DESYREL) tablet 50 mg  50 mg Oral QHS PRN Massengill, Ovid Curd, MD        Lab Results: No results found for this or any previous visit (from the past 48 hour(s)).  Blood Alcohol level:  Lab Results  Component Value Date   ETH <10 10/29/2021   ETH <10 10/62/6948    Metabolic Disorder Labs: Lab Results  Component Value Date   HGBA1C 5.0 10/07/2021   MPG 96.8 10/07/2021   MPG 96.8 09/02/2021   Lab Results  Component Value Date   PROLACTIN 66.2 (H) 03/02/2021   PROLACTIN 66.1 (H) 02/04/2020   Lab Results  Component Value Date   CHOL 175 12/10/2021   TRIG 145 12/10/2021   HDL 44 12/10/2021   CHOLHDL 4.0 12/10/2021   VLDL 29 12/10/2021   LDLCALC 102 (H) 12/10/2021   LDLCALC 74 10/07/2021   Physical Findings: AIMS: 0 CIWA:  n/a COWS:  n/a   Musculoskeletal: Strength & Muscle Tone: within normal limits Gait & Station: normal Patient leans: N/A  Psychiatric Specialty Exam:  Presentation  General Appearance:  Appropriate for Environment; Fairly Groomed  Eye Contact: Fair  Speech: Clear and Coherent  Speech Volume: Normal  Handedness: Right  Mood and Affect  Mood: Irritable  Affect: Congruent  Thought Process  Thought Processes: Coherent  Descriptions of Associations:Intact  Orientation:Partial  Thought Content:Logical  History of Schizophrenia/Schizoaffective disorder:Yes  Duration of Psychotic Symptoms:Greater than six months  Hallucinations:Hallucinations: Auditory Description of Auditory Hallucinations: mumbling voices   Ideas of Reference:None  Suicidal Thoughts:Suicidal Thoughts: No  Homicidal Thoughts:Homicidal Thoughts:  No  Sensorium  Memory: Immediate Good  Judgment: Fair  Insight: Fair   Materials engineer: Fair  Attention Span: Fair  Recall: [employer] of Knowledge: Fair  Language: Fair  Psychomotor Activity   Psychomotor Activity: Psychomotor Activity: Normal  Assets  Assets: Resilience; Housing  Sleep  Sleep: Sleep: Good  Physical Exam: Physical Exam Constitutional:      Appearance: Normal appearance.  HENT:     Head: Normocephalic.     Nose: Nose normal. No congestion or rhinorrhea.  Eyes:     Pupils: Pupils are equal, round, and reactive to light.  Pulmonary:     Effort: Pulmonary effort is normal.  Musculoskeletal:        General: Normal range of motion.     Cervical back: Normal range of motion.  Neurological:     General: No focal deficit present.     Mental Status: She is alert.    Review of Systems  Constitutional:  Negative for fever.  HENT:  Negative for hearing loss.   Eyes: Negative.   Respiratory: Negative.    Cardiovascular:  Positive for palpitations.  Gastrointestinal:  Negative for heartburn.  Musculoskeletal: Negative.   Skin:  Negative for rash.  Neurological: Negative.   Psychiatric/Behavioral:  Positive for depression. Negative for hallucinations (paranoia), memory loss, substance abuse and suicidal ideas. The patient is nervous/anxious. The patient does not have insomnia.    Blood pressure (!) 124/108, pulse (!) 103, temperature 97.6 F (36.4 C), temperature source Oral, resp. rate 20, height '5\' 4"'$  (1.626 m), weight 88.9 kg, SpO2 99 %. Body mass index is 33.64 kg/m.  Treatment Plan Summary: Daily contact with patient to assess and evaluate symptoms and progress in treatment and Medication management   Observation Level/Precautions:  15 minute checks  Laboratory:  Labs reviewed   Psychotherapy:  Unit Group sessions  Medications:  See [facility]  Consultations:  To be determined   Discharge Concerns:  Safety, medication compliance, mood stability  Estimated LOS: 5-7 days  Other:  N/A    Labs Reviewed on 01/09/2022: CMP & CBC reviewed. Beta hcg negative, Tox screen negative, UA with small leukocytes and rare bacteria, pt asymptomatic for a UTI. TSH  WNL, Lipid panel WNL, HA1C from 10/07/21 WNL. QTC from 11/27 with QTC slightly prolonged at 456. Repeat EKG ordered and Qtc even more prolonged at 477. Will f/u with cardiology tomorrow. Pt is asymptomatic.   PLAN Safety and Monitoring: Involuntary admission to inpatient psychiatric unit for safety, stabilization and treatment Daily contact with patient to assess and evaluate symptoms and progress in treatment Patient's case to be discussed in multi-disciplinary team meeting Observation Level : q15 minute checks Vital signs: q12 hours Precautions: Safety No Roommate d/t psychosis and aggressive behaviors   Long Term Goal(s): Improvement in symptoms so as ready for discharge   Short Term Goals: Ability to identify changes in lifestyle to reduce recurrence of condition will improve, Ability to verbalize feelings will improve, Ability to disclose and discuss suicidal ideas, Ability to demonstrate self-control will improve, Ability to identify and develop effective coping behaviors will improve, and Compliance with prescribed medications will improve   Physician Treatment Plan for Secondary Diagnosis:  Principal Problem:   Schizoaffective disorder, bipolar type ([place]) Active Problems:   Borderline personality disorder ([facility])   PTSD (post-traumatic stress disorder)   Asthma   Tachycardia   GAD (generalized anxiety disorder)   Self-injurious behavior   Fibromyalgia   Medications -Continue Saphris patch 3.8 mg  for psychosis & mood (patch is a safe choice for pt currently d/t h/o multiple overdoses on PO medications. QTC with QTC-456, repeat EKG with Qtc 477. Will f/u with Cardiology tomorrow, pt asymptomatic) -Decrease Zyprexa to 2.5 mg PO x 2 doses for psychosis/mood then discontinue it -Continue Protonix 40 mg daily for GERD -Continue Melatonin 3 mg nightly for insomnia -Continue Hydroxyzine 25 mg TID PRN for anxiety -Continue Trazodone to 50 mg nightly PRN for sleep -Continue Toprol XL  12.5 mg daily for tachycardia on 01/04/22 -Continue Breo Ellipta inhaler daily for asthma -Continue Albuterol PRN for SOB/Wheezing -Continue Ativan 2 mg  PO/IM for agitation PRN for agitation -Continue  Zyprexa 10 mg IM PRN BID for agitation -Continue Penicillin V Potassium 500 mg BID for dental infection x 7 days-ends 12/09 -Discontinued Seroquel 25 mg BID on 11/29 -Discontinued Inderal 5 mg Q 12 H for tachycardia on 11/29.   Other PRNS -Continue Tylenol 650 mg every 6 hours PRN for mild pain -Continue Maalox 30 mg every 4 hrs PRN for indigestion -Continue Milk of Magnesia as needed every 6 hrs for constipation   Discharge Planning: Social work and case management to assist with discharge planning and identification of hospital follow-up needs prior to discharge Estimated LOS: 5-7 days Discharge Concerns: Need to establish a safety plan; Medication compliance and effectiveness Discharge Goals: Return home with outpatient referrals for mental health follow-up including medication management/psychotherapy  Nicholes Rough, NP 01/09/2022, 4:07 PMPatient ID: [name], female   DOB: 1990-06-10, 31 y.o.   MRN: [id number] Patient ID: Veleta Yamamoto, female   DOB: [birth year],

## 2022-01-09 NOTE — Progress Notes (Signed)
   01/09/22 0900  Psych Admission Type (Psych Patients Only)  Admission Status Voluntary  Psychosocial Assessment  Patient Complaints Worrying  Eye Contact Fair  Facial Expression Anxious;Flat  Affect Appropriate to circumstance  Speech Logical/coherent  Interaction Assertive  Motor Activity Slow  Appearance/Hygiene Body odor;Disheveled;Poor hygiene  Behavior Characteristics Cooperative  Mood Labile;Preoccupied  Aggressive Behavior  Effect No apparent injury  Thought Process  Coherency Circumstantial  Content Blaming others  Delusions None reported or observed  Perception Hallucinations  Hallucination Auditory  Judgment Impaired  Confusion None  Danger to Self  Current suicidal ideation? Denies  Self-Injurious Behavior No self-injurious ideation or behavior indicators observed or expressed   Agreement Not to Harm Self Yes  Description of Agreement Verbal  Danger to Others  Danger to Others None reported or observed  Danger to Others Abnormal  Harmful Behavior to others No threats or harm toward other people  Destructive Behavior No threats or harm toward property

## 2022-01-10 ENCOUNTER — Encounter (HOSPITAL_COMMUNITY): Payer: Self-pay

## 2022-01-10 ENCOUNTER — Other Ambulatory Visit: Payer: Self-pay

## 2022-01-10 LAB — CBC WITH DIFFERENTIAL/PLATELET
Abs Immature Granulocytes: 0.06 10*3/uL (ref 0.00–0.07)
Basophils Absolute: 0.1 10*3/uL (ref 0.0–0.1)
Basophils Relative: 1 %
Eosinophils Absolute: 0.1 10*3/uL (ref 0.0–0.5)
Eosinophils Relative: 1 %
HCT: 36.1 % (ref 36.0–46.0)
Hemoglobin: 11.2 g/dL — ABNORMAL LOW (ref 12.0–15.0)
Immature Granulocytes: 1 %
Lymphocytes Relative: 35 %
Lymphs Abs: 3.2 10*3/uL (ref 0.7–4.0)
MCH: 25.4 pg — ABNORMAL LOW (ref 26.0–34.0)
MCHC: 31 g/dL (ref 30.0–36.0)
MCV: 81.9 fL (ref 80.0–100.0)
Monocytes Absolute: 0.6 10*3/uL (ref 0.1–1.0)
Monocytes Relative: 7 %
Neutro Abs: 5 10*3/uL (ref 1.7–7.7)
Neutrophils Relative %: 55 %
Platelets: 292 10*3/uL (ref 150–400)
RBC: 4.41 MIL/uL (ref 3.87–5.11)
RDW: 13.9 % (ref 11.5–15.5)
WBC: 9.1 10*3/uL (ref 4.0–10.5)
nRBC: 0 % (ref 0.0–0.2)

## 2022-01-10 MED ORDER — OLANZAPINE 2.5 MG PO TABS
2.5000 mg | ORAL_TABLET | Freq: Every day | ORAL | Status: AC
  Administered 2022-01-10: 2.5 mg via ORAL
  Filled 2022-01-10: qty 1

## 2022-01-10 NOTE — Group Note (Signed)
[facility] LCSW Group Therapy Note  Date/Time: 01/10/2022 @ 1pm  Type of Therapy and Topic:  Group Therapy:  Strengths and Qualities  Participation Level:  Active  Description of Group:    In this group patients will be asked to explore and define the terms strength ans qualities.  Patients will be guided to discuss their thoughts, feelings, and behaviors as to where strengths and qualities originate. Participants will then list some of their strengths and qualities related to each subject topic. This group will be process-oriented, with patients participating in exploration of their own experiences as well as giving and receiving support and challenge from other group members.  Therapeutic Goals: Patient will identify specific strengths related to their personal life. Patient will identify feelings, thoughts, and beliefs about strengths and qualities. Patient will identify ways their strengths have been used. . Patient will identify situations where they have helped others or made someone else happy. .  Summary of Patient Progress Patient participated in group on today. Patient was appropriate, attentive, and sharing in her experiences. Patient was able to discuss some of her feelings and behaviors related to the obstacles she has faced. Patient also shared things she feels she is good at and what she loves about her appearance. Patient interacted positively with staff and peers, and was receptive to feedback provided by staff.     Therapeutic Modalities:   Cognitive Behavioral Therapy Solution Focused Therapy Motivational Interviewing Brief Therapy   Joeann Steppe, LCSW, [place] Social Worker  [facility]

## 2022-01-10 NOTE — Progress Notes (Signed)
[facility] MD Progress Note  01/10/2022 3:59 PM [name]  MRN:  [id number] Principal Problem: Schizoaffective disorder, bipolar type (HCC) Diagnosis: Principal Problem:   Schizoaffective disorder, bipolar type (HCC) Active Problems:   Borderline personality disorder (HCC)   PTSD (post-traumatic stress disorder)   Asthma   GAD (generalized anxiety disorder)   Fibromyalgia  Reason For Admission: [name] is a 31 yo Caucasian female with prior mental health health diagnoses of borderline personality d/o & Schizoaffective d/o who was taken to the [facility] by [facility] after she called the crises hotline number to report that she had overdosed on multiple medications in an attempt to end her life. As per ED assessment, "she took 6 40mg  Zofran pills, 6 25mg  Atenolol pills, 6 10mg  Cyclobenzapr pills, 18 5mg  escitalopram pills, 18 10mg  Hydroxyz pills, 6 40mg  pantroprazole pills, 6 1mg  Prazosin hcl pills, 18 50mg  pregabalin pills, 3 25mg  Quetiapine pills, and 6 1.5mg  vraylar pills."  Patient was involuntarily committed and transferred to this behavioral health Hospital for treatment and stabilization of her mood.   24 hr chart review: V/S over the past 24 hrs with periods of elevations in HR.Marland Kitchen HR was 106 earlier today morning. Pt slept for a total of 7.25 hrs last night as per nursing flow sheets. She has been compliant with scheduled medications, and required PRN hydroxyzine 25 mg for anxiety last night and Zofran 4 mg for nausea today afternoon. Pt has attended some unit group sessions over the past 24 hrs and has continued to actively participate. As per nursing documentation from last night, pt had some auditory hallucinations overnight.  Patient assessment note: Mood today is less irritable & less depressed. Affect is congruent. Her attention to personal hygiene and grooming is fair, eye contact is poor, speech is clear & coherent. Thought contents are more organized and more logical, and pt  currently denies SI/HI/AVH or paranoia. She denies other illogical thinking.    Pt reports that sleep last night was good, and states that her appetite continues to be good. She reports feeling "bored, but good". She reports last BM as being yesterday. She denies being in any physical distress.  Pt denies any medication related side effects, No TD/EPS type symptoms found on assessment, and pt denies any feelings of stiffness. AIMS: 0. Plan continues to be to get pt's to the minimal amounts and strengths of medications necessary to stabilize her mood prior to discharge. We will therefore, decrease Zyprexa to 2.5 mg further to 1 dose to be given tonight, then stop it. We will continue Saphris patch as well as other medications as listed below. Will revisit discharge planning tomorrow morning.  Total Time spent with patient: 45 minutes  Past Psychiatric History: Schizoaffective d/o  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 fragile X syndrome    Chronic constipation    Depression    Drug-seeking behavior    Essential tremor    Headache    Overdose of acetaminophen 07/2017   and other meds   Personality disorder (HCC)    Schizo-affective psychosis (HCC)    Schizoaffective disorder, bipolar type (HCC)    Seizures (HCC)    Last seizure December 2017   Sleep apnea     Past Surgical History:  Procedure Laterality Date   MOUTH SURGERY  2009 or 2010  Family History:  Family History  Problem Relation Age of Onset   Mental illness Father    Asthma Father    PDD Brother    Seizures Brother    Family Psychiatric  History: As above Social History:  Social History   Substance and Sexual Activity  Alcohol Use No   Alcohol/week: 1.0 standard drink of alcohol   Types: 1 Standard drinks or equivalent per week   Comment: denies at this time     Social History   Substance and  Sexual Activity  Drug Use No   Comment: History of cocaine use at age 31 for 4 months    Social History   Socioeconomic History   Marital status: Widowed    Spouse name: Not on file   Number of children: 0   Years of education: Not on file   Highest education level: Not on file  Occupational History   Occupation: disability  Tobacco Use   Smoking status: Former    Packs/day: 0.00    Types: Cigarettes   Smokeless tobacco: Never   Tobacco comments:    Smoked for 2  years age [age]  Vaping Use   Vaping Use: Never used  Substance and Sexual Activity   Alcohol use: No    Alcohol/week: 1.0 standard drink of alcohol    Types: 1 Standard drinks or equivalent per week    Comment: denies at this time   Drug use: No    Comment: History of cocaine use at age 31 for 4 months   Sexual activity: Not Currently    Birth control/protection: None  Other Topics Concern   Not on file  Social History Narrative   Marital status: Widowed      Children: daughter      Lives: with boyfriend, in two story home      Employment:  Disability      Tobacco: quit smoking; smoked for two years.      Alcohol ;none      Drugs: none   Has not traveled outside of the country.   Right handed         Social Determinants of Health   Financial Resource Strain: Not on file  Food Insecurity: No Food Insecurity (01/01/2022)   Hunger Vital Sign    Worried About Running Out of Food in the Last Year: Never true    Ran Out of Food in the Last Year: Never true  Recent Concern: Food Insecurity - Food Insecurity Present (12/28/2021)   Hunger Vital Sign    Worried About Programme researcher, broadcasting/film/video in the Last Year: Often true    Ran Out of Food in the Last Year: Often true  Transportation Needs: Unmet Transportation Needs (01/01/2022)   PRAPARE - Administrator, Civil Service (Medical): No    Lack of Transportation (Non-Medical): Yes  Physical Activity: Not on file  Stress: Not on file  Social  Connections: Not on file   Additional Social History:     Sleep: Poor  Appetite:  Good  Current Medications: Current Facility-Administered Medications  Medication Dose Route Frequency Provider Last Rate Last Admin   albuterol (VENTOLIN HFA) 108 (90 Base) MCG/ACT inhaler 2 puff  2 puff Inhalation Q6H PRN Massengill, Harrold Donath, MD       alum & mag hydroxide-simeth (MAALOX/MYLANTA) 200-200-20 MG/5ML suspension 30 mL  30 mL Oral Q4H PRN Carlyn Reichert, MD   30 mL at 01/07/22 1507   Asenapine PT24 3.8 mg  3.8 mg Transdermal Daily Massengill, Harrold Donath, MD   3.8 mg at 01/10/22 0735   fluticasone furoate-vilanterol (BREO ELLIPTA) 200-25 MCG/ACT 1 puff  1 puff Inhalation Daily Massengill, Harrold Donath, MD   1 puff at 01/10/22 0736   hydrOXYzine (ATARAX) tablet 25 mg  25 mg Oral TID PRN Carlyn Reichert, MD   25 mg at 01/09/22 2026   ibuprofen (ADVIL) tablet 200 mg  200 mg Oral Daily PRN Bobbitt, Shalon E, NP   200 mg at 01/10/22 1548   LORazepam (ATIVAN) tablet 2 mg  2 mg Oral Q6H PRN Massengill, Harrold Donath, MD       Or   LORazepam (ATIVAN) injection 2 mg  2 mg Intramuscular Q6H PRN Massengill, Harrold Donath, MD       magnesium hydroxide (MILK OF MAGNESIA) suspension 30 mL  30 mL Oral Daily PRN Carlyn Reichert, MD       melatonin tablet 3 mg  3 mg Oral QHS Massengill, Harrold Donath, MD   3 mg at 01/09/22 2026   metoprolol succinate (TOPROL-XL) 24 hr tablet 12.5 mg  12.5 mg Oral Daily Massengill, Harrold Donath, MD   12.5 mg at 01/10/22 0735   OLANZapine (ZYPREXA) injection 10 mg  10 mg Intramuscular BID PRN Massengill, Harrold Donath, MD       OLANZapine (ZYPREXA) tablet 2.5 mg  2.5 mg Oral QHS Laelyn Blumenthal, NP       OLANZapine zydis (ZYPREXA) disintegrating tablet 10 mg  10 mg Oral BID PRN Massengill, Harrold Donath, MD   10 mg at 01/06/22 1440   ondansetron (ZOFRAN) tablet 4 mg  4 mg Oral Q8H PRN Massengill, Harrold Donath, MD   4 mg at 01/10/22 1339   pantoprazole (PROTONIX) EC tablet 40 mg  40 mg Oral Daily Massengill, Harrold Donath, MD   40 mg at 01/10/22  0736   penicillin v potassium (VEETID) tablet 500 mg  500 mg Oral Q6H Attiah, Nadir, MD   500 mg at 01/10/22 1300   traZODone (DESYREL) tablet 50 mg  50 mg Oral QHS PRN Massengill, Harrold Donath, MD        Lab Results: No results found for this or any previous visit (from the past 48 hour(s)).  Blood Alcohol level:  Lab Results  Component Value Date   ETH <10 10/29/2021   ETH <10 10/11/2021    Metabolic Disorder Labs: Lab Results  Component Value Date   HGBA1C 5.0 10/07/2021   MPG 96.8 10/07/2021   MPG 96.8 09/02/2021   Lab Results  Component Value Date   PROLACTIN 66.2 (H) 03/02/2021   PROLACTIN 66.1 (H) 02/04/2020   Lab Results  Component Value Date   CHOL 175 12/10/2021   TRIG 145 12/10/2021   HDL 44 12/10/2021   CHOLHDL 4.0 12/10/2021   VLDL 29 12/10/2021   LDLCALC 102 (H) 12/10/2021   LDLCALC 74 10/07/2021   Physical Findings: AIMS: 0 CIWA:  n/a COWS:  n/a   Musculoskeletal: Strength & Muscle Tone: within normal limits Gait & Station: normal Patient leans: N/A  Psychiatric Specialty Exam:  Presentation  General Appearance:  Appropriate for Environment; Fairly Groomed  Eye Contact: Fair  Speech: Clear and Coherent  Speech Volume: Normal  Handedness: Right  Mood and Affect  Mood: Depressed  Affect: Congruent  Thought Process  Thought Processes: Coherent  Descriptions of Associations:Intact  Orientation:Partial  Thought Content:Logical  History of Schizophrenia/Schizoaffective disorder:Yes  Duration of Psychotic Symptoms:Greater than six months  Hallucinations:Hallucinations: None Description of Auditory Hallucinations: mumbling voices   Ideas of Reference:None  Suicidal Thoughts:Suicidal  Thoughts: No  Homicidal Thoughts:Homicidal Thoughts: No  Sensorium  Memory: Immediate Good  Judgment: Fair  Insight: Fair   Chartered certified accountant: Fair  Attention Span: Fair  Recall: [employer] of  Knowledge: Fair  Language: Fair  Psychomotor Activity  Psychomotor Activity: Psychomotor Activity: Normal  Assets  Assets: Communication Skills  Sleep  Sleep: Sleep: Good  Physical Exam: Physical Exam Constitutional:      Appearance: Normal appearance.  HENT:     Head: Normocephalic.     Nose: Nose normal. No congestion or rhinorrhea.  Eyes:     Pupils: Pupils are equal, round, and reactive to light.  Pulmonary:     Effort: Pulmonary effort is normal.  Musculoskeletal:        General: Normal range of motion.     Cervical back: Normal range of motion.  Neurological:     General: No focal deficit present.     Mental Status: She is alert.    Review of Systems  Constitutional:  Negative for fever.  HENT:  Negative for hearing loss.   Eyes: Negative.   Respiratory: Negative.    Cardiovascular:  Positive for palpitations.  Gastrointestinal:  Negative for heartburn.  Musculoskeletal: Negative.   Skin:  Negative for rash.  Neurological: Negative.   Psychiatric/Behavioral:  Positive for depression. Negative for hallucinations (paranoia), memory loss, substance abuse and suicidal ideas. The patient is nervous/anxious. The patient does not have insomnia.    Blood pressure 128/68, pulse (!) 106, temperature (!) 97.4 F (36.3 C), temperature source Oral, resp. rate 20, height 5\' 4"  (1.626 m), weight 88.9 kg, SpO2 100 %. Body mass index is 33.64 kg/m.  Treatment Plan Summary: Daily contact with patient to assess and evaluate symptoms and progress in treatment and Medication management   Observation Level/Precautions:  15 minute checks  Laboratory:  Labs reviewed   Psychotherapy:  Unit Group sessions  Medications:  See [facility]  Consultations:  To be determined   Discharge Concerns:  Safety, medication compliance, mood stability  Estimated LOS: 5-7 days  Other:  N/A    Labs Reviewed on 01/09/2022: CMP & CBC reviewed. Beta hcg negative, Tox screen negative, UA with small  leukocytes and rare bacteria, pt asymptomatic for a UTI. TSH WNL, Lipid panel WNL, HA1C from 10/07/21 WNL. QTC from 11/27 with QTC slightly prolonged at 456. Repeat EKG ordered and Qtc even more prolonged at 477. Pt is asymptomatic. Will repeat EKG tomorrow.    PLAN Safety and Monitoring: Involuntary admission to inpatient psychiatric unit for safety, stabilization and treatment Daily contact with patient to assess and evaluate symptoms and progress in treatment Patient's case to be discussed in multi-disciplinary team meeting Observation Level : q15 minute checks Vital signs: q12 hours Precautions: Safety No Roommate d/t psychosis and aggressive behaviors   Long Term Goal(s): Improvement in symptoms so as ready for discharge   Short Term Goals: Ability to identify changes in lifestyle to reduce recurrence of condition will improve, Ability to verbalize feelings will improve, Ability to disclose and discuss suicidal ideas, Ability to demonstrate self-control will improve, Ability to identify and develop effective coping behaviors will improve, and Compliance with prescribed medications will improve   Physician Treatment Plan for Secondary Diagnosis:  Principal Problem:   Schizoaffective disorder, bipolar type (HCC) Active Problems:   Borderline personality disorder (HCC)   PTSD (post-traumatic stress disorder)   Asthma   Tachycardia   GAD (generalized anxiety disorder)   Self-injurious behavior   Fibromyalgia  Medications -Continue Saphris patch 3.8 mg for psychosis & mood (patch is a safe choice for pt currently d/t h/o multiple overdoses on PO medications. QTC with QTC-456, repeat EKG with Qtc 477. Will repeat EKG tomorrow, pt asymptomatic) -Decrease Zyprexa to 2.5 mg PO x 1 dose to be given tonight, then discontinue it. -Continue Protonix 40 mg daily for GERD -Continue Melatonin 3 mg nightly for insomnia -Continue Hydroxyzine 25 mg TID PRN for anxiety -Continue Trazodone to 50  mg nightly PRN for sleep -Continue Toprol XL 12.5 mg daily for tachycardia on 01/04/22 -Continue Breo Ellipta inhaler daily for asthma -Continue Albuterol PRN for SOB/Wheezing -Continue Ativan 2 mg  PO/IM for agitation PRN for agitation -Continue  Zyprexa 10 mg IM PRN BID for agitation -Continue Penicillin V Potassium 500 mg BID for dental infection x 7 days-ends 12/09 -Discontinued Seroquel 25 mg BID on 11/29 -Discontinued Inderal 5 mg Q 12 H for tachycardia on 11/29.   Other PRNS -Continue Tylenol 650 mg every 6 hours PRN for mild pain -Continue Maalox 30 mg every 4 hrs PRN for indigestion -Continue Milk of Magnesia as needed every 6 hrs for constipation   Discharge Planning: Social work and case management to assist with discharge planning and identification of hospital follow-up needs prior to discharge Estimated LOS: 5-7 days Discharge Concerns: Need to establish a safety plan; Medication compliance and effectiveness Discharge Goals: Return home with outpatient referrals for mental health follow-up including medication management/psychotherapy  Starleen Blue, NP 01/10/2022, 3:59 PMPatient ID: [name], female   DOB: [birth year], 31 y.o.   MRN: [id number] Patient ID: [name], female   DOB: Feb 15, 1990, Patient ID: [name], female   DOB: July 23, 1990, 31 y.o.   MRN: [id number]

## 2022-01-10 NOTE — Progress Notes (Signed)
Pt sent to [facility] for evaluation of R-flank pain per NP-Victoria   01/10/22 2200  Psych Admission Type (Psych Patients Only)  Admission Status Voluntary  Psychosocial Assessment  Patient Complaints Anxiety  Eye Contact Fair  Facial Expression Anxious;Sad  Affect Appropriate to circumstance  Speech Logical/coherent  Interaction Assertive  Motor Activity Slow  Appearance/Hygiene Disheveled  Behavior Characteristics Cooperative  Mood Labile;Suspicious;Preoccupied  Aggressive Behavior  Effect No apparent injury  Thought Process  Coherency Circumstantial  Content Blaming others  Delusions None reported or observed  Perception Hallucinations  Hallucination Auditory  Judgment Impaired  Confusion None  Danger to Self  Current suicidal ideation? Denies  Danger to Others  Danger to Others None reported or observed  Danger to Others Abnormal  Harmful Behavior to others No threats or harm toward other people

## 2022-01-10 NOTE — BH IP Treatment Plan (Signed)
Interdisciplinary Treatment and Diagnostic Plan Update  01/10/2022 Time of Session: 10:20am  [name] MRN: [id number]  Principal Diagnosis: Schizoaffective disorder, bipolar type [facility])  Secondary Diagnoses: Principal Problem:   Schizoaffective disorder, bipolar type ([place]) Active Problems:   Borderline personality disorder ([place])   PTSD (post-traumatic stress disorder)   Asthma   GAD (generalized anxiety disorder)   Fibromyalgia   Current Medications:  Current Facility-Administered Medications  Medication Dose Route Frequency Provider Last Rate Last Admin   albuterol (VENTOLIN HFA) 108 (90 Base) MCG/ACT inhaler 2 puff  2 puff Inhalation Q6H PRN Massengill, Ovid Curd, MD       alum & mag hydroxide-simeth (MAALOX/MYLANTA) 200-200-20 MG/5ML suspension 30 mL  30 mL Oral Q4H PRN Corky Sox, MD   30 mL at 01/07/22 1507   Asenapine PT24 3.8 mg  3.8 mg Transdermal Daily Massengill, Ovid Curd, MD   3.8 mg at 01/10/22 0735   fluticasone furoate-vilanterol (BREO ELLIPTA) 200-25 MCG/ACT 1 puff  1 puff Inhalation Daily Massengill, Ovid Curd, MD   1 puff at 01/10/22 0736   hydrOXYzine (ATARAX) tablet 25 mg  25 mg Oral TID PRN Corky Sox, MD   25 mg at 01/09/22 2026   ibuprofen (ADVIL) tablet 200 mg  200 mg Oral Daily PRN Bobbitt, Shalon E, NP   200 mg at 01/09/22 2001   LORazepam (ATIVAN) tablet 2 mg  2 mg Oral Q6H PRN Massengill, Ovid Curd, MD       Or   LORazepam (ATIVAN) injection 2 mg  2 mg Intramuscular Q6H PRN Massengill, Ovid Curd, MD       magnesium hydroxide (MILK OF MAGNESIA) suspension 30 mL  30 mL Oral Daily PRN Corky Sox, MD       melatonin tablet 3 mg  3 mg Oral QHS Massengill, Ovid Curd, MD   3 mg at 01/09/22 2026   metoprolol succinate (TOPROL-XL) 24 hr tablet 12.5 mg  12.5 mg Oral Daily Massengill, Ovid Curd, MD   12.5 mg at 01/10/22 0735   OLANZapine (ZYPREXA) injection 10 mg  10 mg Intramuscular BID PRN Massengill, Ovid Curd, MD       OLANZapine (ZYPREXA) tablet 2.5 mg  2.5  mg Oral QHS Massengill, Nathan, MD       OLANZapine zydis (ZYPREXA) disintegrating tablet 10 mg  10 mg Oral BID PRN Janine Limbo, MD   10 mg at 01/06/22 1440   ondansetron (ZOFRAN) tablet 4 mg  4 mg Oral Q8H PRN Massengill, Ovid Curd, MD   4 mg at 01/09/22 1053   pantoprazole (PROTONIX) EC tablet 40 mg  40 mg Oral Daily Massengill, Ovid Curd, MD   40 mg at 01/10/22 0736   penicillin v potassium (VEETID) tablet 500 mg  500 mg Oral Q6H Attiah, Nadir, MD   500 mg at 01/10/22 0736   traZODone (DESYREL) tablet 50 mg  50 mg Oral QHS PRN Massengill, Ovid Curd, MD       PTA Medications: Medications Prior to Admission  Medication Sig Dispense Refill Last Dose   albuterol (VENTOLIN HFA) 108 (90 Base) MCG/ACT inhaler Inhale 2 puffs into the lungs every 6 (six) hours as needed for wheezing or shortness of breath.      budesonide-formoterol (SYMBICORT) 160-4.5 MCG/ACT inhaler Inhale 2 puffs into the lungs 2 (two) times daily.       Patient Stressors:    Patient Strengths:    Treatment Modalities: Medication Management, Group therapy, Case management,  1 to 1 session with clinician, Psychoeducation, Recreational therapy.   Physician Treatment Plan for Primary Diagnosis:  Schizoaffective disorder, bipolar type ([place]) Long Term Goal(s): Improvement in symptoms so as ready for discharge   Short Term Goals: Ability to identify changes in lifestyle to reduce recurrence of condition will improve Ability to verbalize feelings will improve Ability to disclose and discuss suicidal ideas Ability to demonstrate self-control will improve Ability to identify and develop effective coping behaviors will improve Compliance with prescribed medications will improve  Medication Management: Evaluate patient's response, side effects, and tolerance of medication regimen.  Therapeutic Interventions: 1 to 1 sessions, Unit Group sessions and Medication administration.  Evaluation of Outcomes: Progressing  Physician  Treatment Plan for Secondary Diagnosis: Principal Problem:   Schizoaffective disorder, bipolar type ([place]) Active Problems:   Borderline personality disorder ([place])   PTSD (post-traumatic stress disorder)   Asthma   GAD (generalized anxiety disorder)   Fibromyalgia  Long Term Goal(s): Improvement in symptoms so as ready for discharge   Short Term Goals: Ability to identify changes in lifestyle to reduce recurrence of condition will improve Ability to verbalize feelings will improve Ability to disclose and discuss suicidal ideas Ability to demonstrate self-control will improve Ability to identify and develop effective coping behaviors will improve Compliance with prescribed medications will improve     Medication Management: Evaluate patient's response, side effects, and tolerance of medication regimen.  Therapeutic Interventions: 1 to 1 sessions, Unit Group sessions and Medication administration.  Evaluation of Outcomes: Progressing   RN Treatment Plan for Primary Diagnosis: Schizoaffective disorder, bipolar type ([place]) Long Term Goal(s): Knowledge of disease and therapeutic regimen to maintain health will improve  Short Term Goals: Ability to remain free from injury will improve, Ability to participate in decision making will improve, Ability to verbalize feelings will improve, Ability to disclose and discuss suicidal ideas, and Ability to identify and develop effective coping behaviors will improve  Medication Management: RN will administer medications as ordered by provider, will assess and evaluate patient's response and provide education to patient for prescribed medication. RN will report any adverse and/or side effects to prescribing provider.  Therapeutic Interventions: 1 on 1 counseling sessions, Psychoeducation, Medication administration, Evaluate responses to treatment, Monitor vital signs and CBGs as ordered, Perform/monitor CIWA, COWS, AIMS and Fall Risk screenings as  ordered, Perform wound care treatments as ordered.  Evaluation of Outcomes: Progressing   LCSW Treatment Plan for Primary Diagnosis: Schizoaffective disorder, bipolar type ([place]) Long Term Goal(s): Safe transition to appropriate next level of care at discharge, Engage patient in therapeutic group addressing interpersonal concerns.  Short Term Goals: Engage patient in aftercare planning with referrals and resources, Increase social support, Increase emotional regulation, Facilitate acceptance of mental health diagnosis and concerns, Identify triggers associated with mental health/substance abuse issues, and Increase skills for wellness and recovery  Therapeutic Interventions: Assess for all discharge needs, 1 to 1 time with Social worker, Explore available resources and support systems, Assess for adequacy in community support network, Educate family and significant other(s) on suicide prevention, Complete Psychosocial Assessment, Interpersonal group therapy.  Evaluation of Outcomes: Progressing   Progress in Treatment: Attending groups: No. Participating in groups: No. Taking medication as prescribed: Yes. Toleration medication: Yes. Family/Significant other contact made: Yes, individual(s) contacted:  [place] Team Patient understands diagnosis: No. Discussing patient identified problems/goals with staff: Yes. Medical problems stabilized or resolved: Yes. Denies suicidal/homicidal ideation: Yes. Issues/concerns per patient self-inventory: No.     New problem(s) identified: No, Describe:  None    New Short Term/Long Term Goal(s): medication stabilization, elimination of SI thoughts, development of  comprehensive mental wellness plan.    Patient Goals: "To go to [facility] and look for housing"    Discharge Plan or Barriers: Patient recently admitted. CSW will continue to follow and assess for appropriate referrals and possible discharge planning.    Reason for Continuation of  Hospitalization: Anxiety Depression Medication stabilization Suicidal ideation   Estimated Length of Stay: 3 to 7 days    Last 3 [place] Suicide Severity Risk Score: [place] Admission (Current) from 01/01/2022 in [place] [facility] ED to Hosp-Admission (Discharged) from 12/27/2021 in [place] PCU ED from 12/12/2021 in [place] High Risk High Risk No Risk       Last PHQ 2/9 Scores:    06/27/2021    3:18 AM 05/01/2021    2:16 AM 02/03/2020    2:24 AM  Depression screen PHQ 2/9  Decreased Interest '1 2 3  '$ Down, Depressed, Hopeless '2 3 3  '$ PHQ - 2 Score '3 5 6  '$ Altered sleeping 0 0 3  Tired, decreased energy '1 1 3  '$ Change in appetite 0 1 3  Feeling bad or failure about yourself  '1 3 3  '$ Trouble concentrating '2 3 3  '$ Moving slowly or fidgety/restless '1 1 3  '$ Suicidal thoughts '1 3 3  '$ PHQ-9 Score '9 17 27  '$ Difficult doing work/chores Somewhat difficult Extremely dIfficult     Scribe for Treatment Team: Darleen Crocker, Latanya Presser 01/10/2022 11:50 AM

## 2022-01-10 NOTE — Group Note (Signed)
Recreation Therapy Group Note   Group Topic:Anger Management  Group Date: 01/10/2022 Start Time: 6728 End Time: 9791 Facilitators: Cloee Dunwoody-McCall, LRT,CTRS Location: 500 Hall Dayroom   Goal Area(s) Addresses:  Patient will identify a situation that makes them angry.   Patient will identify ways they cope with anger.  Group Description:  Anger Thermometer. Patients were to fill in a thermometer rating the top 10 things that make them angry from 1-10.  Patients were to then identify ways they cope with their anger.   Clinical Observations/Individualized Feedback: Unable to do group due to acuity on the unit.    Plan: Continue to engage patient in RT group sessions 2-3x/week.   [name], LRT,CTRS 01/10/2022 11:28 AM

## 2022-01-10 NOTE — Progress Notes (Signed)
Patient has been cooperative today with all processes. Denies SI/HI/AVH at present. Patient has taken all medications with no problems. Complained of nausea earlier and a headache. She has been attending groups and participating in the milleu. Currently pacing up and down hallway. She continues to talk about her fiance and moving to [place], and about her stepson and daughter.

## 2022-01-10 NOTE — ED Triage Notes (Signed)
Patient BIB EMS from [facility] for evaluation of abdominal pain.  Reports pain was sudden in onset and started around 3 pm today.  No vomiting.  Does c/o nausea and increased urination.

## 2022-01-10 NOTE — Progress Notes (Signed)
   01/10/22 0530  Sleep  Number of Hours 7.25

## 2022-01-10 NOTE — Progress Notes (Signed)
Nurse in charge reported that patient complained of abdominal pain. Upon assessment, patient was seen lying on the bed in a fetal position. Patient says that abdominal pain started today around 3pm. [name] reported pain 10/10. Patient's abdomen was assessed and site is tender to touch, painful with no redness. Patient reported that she had some medication for pain earlier today with no effect and she has been using heat pad with minimal effect. Patient will be sent to the [facility] for medical clearance Erasmo Score, NP spoke with Dr Wyvonnia Dusky at [facility] and has agreed to accept the patient. Patient is under IVC and may return when medically cleared.

## 2022-01-10 NOTE — Progress Notes (Signed)
Pt came to the nursing station complaining of R-flank pain 10/10 . Writer notified NP-Victoria and she was assessed and recommended to go out for medical clearance . Will continue to monitor

## 2022-01-11 ENCOUNTER — Inpatient Hospital Stay (HOSPITAL_COMMUNITY): Payer: PPO

## 2022-01-11 ENCOUNTER — Inpatient Hospital Stay (HOSPITAL_COMMUNITY)
Admission: AD | Admit: 2022-01-11 | Discharge: 2022-01-16 | Disposition: A | Discharge status: Home / Self Care | Payer: PPO | Source: Intra-hospital | Attending: Psychiatry | Admitting: Psychiatry

## 2022-01-11 ENCOUNTER — Encounter (HOSPITAL_COMMUNITY): Payer: Self-pay | Admitting: Student

## 2022-01-11 DIAGNOSIS — J45909 Unspecified asthma, uncomplicated: Secondary | ICD-10-CM | POA: Diagnosis present

## 2022-01-11 DIAGNOSIS — F259 Schizoaffective disorder, unspecified: Secondary | ICD-10-CM | POA: Diagnosis present

## 2022-01-11 DIAGNOSIS — M25579 Pain in unspecified ankle and joints of unspecified foot: Secondary | ICD-10-CM | POA: Diagnosis present

## 2022-01-11 DIAGNOSIS — Z7289 Other problems related to lifestyle: Secondary | ICD-10-CM

## 2022-01-11 DIAGNOSIS — F4310 Post-traumatic stress disorder, unspecified: Secondary | ICD-10-CM | POA: Diagnosis present

## 2022-01-11 DIAGNOSIS — G4700 Insomnia, unspecified: Secondary | ICD-10-CM | POA: Diagnosis present

## 2022-01-11 DIAGNOSIS — F603 Borderline personality disorder: Secondary | ICD-10-CM | POA: Diagnosis present

## 2022-01-11 DIAGNOSIS — F251 Schizoaffective disorder, depressive type: Secondary | ICD-10-CM | POA: Diagnosis present

## 2022-01-11 DIAGNOSIS — F250 Schizoaffective disorder, bipolar type: Principal | ICD-10-CM | POA: Diagnosis present

## 2022-01-11 DIAGNOSIS — F411 Generalized anxiety disorder: Secondary | ICD-10-CM | POA: Diagnosis present

## 2022-01-11 LAB — COMPREHENSIVE METABOLIC PANEL
ALT: 16 U/L (ref 0–44)
AST: 16 U/L (ref 15–41)
Albumin: 3.4 g/dL — ABNORMAL LOW (ref 3.5–5.0)
Alkaline Phosphatase: 55 U/L (ref 38–126)
Anion gap: 7 (ref 5–15)
BUN: 15 mg/dL (ref 6–20)
CO2: 21 mmol/L — ABNORMAL LOW (ref 22–32)
Calcium: 8.8 mg/dL — ABNORMAL LOW (ref 8.9–10.3)
Chloride: 107 mmol/L (ref 98–111)
Creatinine, Ser: 0.67 mg/dL (ref 0.44–1.00)
GFR, Estimated: >60 mL/min (ref >60)
Glucose, Bld: 108 mg/dL — ABNORMAL HIGH (ref 70–99)
Potassium: 3.9 mmol/L (ref 3.5–5.1)
Sodium: 135 mmol/L (ref 135–145)
Total Bilirubin: 0.2 mg/dL — ABNORMAL LOW (ref 0.3–1.2)
Total Protein: 6.7 g/dL (ref 6.5–8.1)

## 2022-01-11 LAB — URINALYSIS, ROUTINE W REFLEX MICROSCOPIC
Bacteria, UA: NONE SEEN
Bilirubin Urine: NEGATIVE
Glucose, UA: NEGATIVE mg/dL
Hgb urine dipstick: NEGATIVE
Ketones, ur: NEGATIVE mg/dL
Leukocytes,Ua: NEGATIVE
Nitrite: NEGATIVE
Protein, ur: NEGATIVE mg/dL
Specific Gravity, Urine: 1.013 (ref 1.005–1.030)
pH: 5 (ref 5.0–8.0)

## 2022-01-11 LAB — PREGNANCY, URINE: Preg Test, Ur: NEGATIVE

## 2022-01-11 LAB — LIPASE, BLOOD: Lipase: 37 U/L (ref 11–51)

## 2022-01-11 MED ORDER — PENICILLIN V POTASSIUM 500 MG PO TABS
500.0000 mg | ORAL_TABLET | Freq: Four times a day (QID) | ORAL | Status: AC
  Administered 2022-01-11 – 2022-01-13 (×8): 500 mg via ORAL
  Filled 2022-01-11 (×11): qty 1

## 2022-01-11 MED ORDER — HYDROMORPHONE HCL 1 MG/ML IJ SOLN
0.5000 mg | Freq: Once | INTRAMUSCULAR | Status: AC
  Administered 2022-01-11: 0.5 mg via INTRAVENOUS
  Filled 2022-01-11: qty 1

## 2022-01-11 MED ORDER — ONDANSETRON HCL 4 MG/2ML IJ SOLN
4.0000 mg | Freq: Once | INTRAMUSCULAR | Status: AC
  Administered 2022-01-11: 4 mg via INTRAVENOUS
  Filled 2022-01-11: qty 2

## 2022-01-11 MED ORDER — PENICILLIN V POTASSIUM 250 MG/5ML PO SOLR
500.0000 mg | Freq: Four times a day (QID) | ORAL | Status: DC
  Filled 2022-01-11 (×4): qty 10

## 2022-01-11 MED ORDER — HYDROXYZINE HCL 25 MG PO TABS
25.0000 mg | ORAL_TABLET | Freq: Three times a day (TID) | ORAL | Status: DC | PRN
  Administered 2022-01-11 – 2022-01-15 (×4): 25 mg via ORAL
  Filled 2022-01-11 (×4): qty 1

## 2022-01-11 MED ORDER — METOPROLOL SUCCINATE 12.5 MG HALF TABLET
12.5000 mg | ORAL_TABLET | Freq: Every day | ORAL | Status: DC
  Administered 2022-01-12 – 2022-01-16 (×5): 12.5 mg via ORAL
  Filled 2022-01-11: qty 7
  Filled 2022-01-11 (×7): qty 1

## 2022-01-11 MED ORDER — OLANZAPINE 10 MG IM SOLR: 10.0000 mg | Freq: Three times a day (TID) | INTRAMUSCULAR | Status: DC | PRN

## 2022-01-11 MED ORDER — MELATONIN 3 MG PO TABS
3.0000 mg | ORAL_TABLET | Freq: Every day | ORAL | Status: DC
  Administered 2022-01-11 – 2022-01-15 (×5): 3 mg via ORAL
  Filled 2022-01-11 (×7): qty 1
  Filled 2022-01-11: qty 7

## 2022-01-11 MED ORDER — ONDANSETRON HCL 4 MG PO TABS
4.0000 mg | ORAL_TABLET | Freq: Three times a day (TID) | ORAL | Status: DC | PRN
  Administered 2022-01-13 – 2022-01-15 (×2): 4 mg via ORAL
  Filled 2022-01-11 (×2): qty 1

## 2022-01-11 MED ORDER — ASENAPINE 3.8 MG/24HR TD PT24
3.8000 mg | MEDICATED_PATCH | Freq: Every day | TRANSDERMAL | Status: DC
  Administered 2022-01-11 – 2022-01-16 (×6): 3.8 mg via TRANSDERMAL
  Filled 2022-01-11: qty 1

## 2022-01-11 MED ORDER — ALBUTEROL SULFATE HFA 108 (90 BASE) MCG/ACT IN AERS: 1.0000 | INHALATION_SPRAY | Freq: Four times a day (QID) | RESPIRATORY_TRACT | Status: DC | PRN

## 2022-01-11 MED ORDER — OLANZAPINE 10 MG PO TABS
10.0000 mg | ORAL_TABLET | Freq: Three times a day (TID) | ORAL | Status: DC | PRN
  Administered 2022-01-11 – 2022-01-13 (×3): 10 mg via ORAL
  Filled 2022-01-11 (×4): qty 1

## 2022-01-11 MED ORDER — FLUTICASONE FUROATE-VILANTEROL 200-25 MCG/ACT IN AEPB
1.0000 | INHALATION_SPRAY | Freq: Every day | RESPIRATORY_TRACT | Status: DC
  Administered 2022-01-11 – 2022-01-16 (×6): 1 via RESPIRATORY_TRACT
  Filled 2022-01-11: qty 28

## 2022-01-11 MED ORDER — IOHEXOL 300 MG/ML  SOLN
100.0000 mL | Freq: Once | INTRAMUSCULAR | Status: AC | PRN
  Administered 2022-01-11: 100 mL via INTRAVENOUS

## 2022-01-11 MED ORDER — HYDROXYZINE HCL 25 MG PO TABS
ORAL_TABLET | ORAL | Status: AC
  Administered 2022-01-11: 25 mg via ORAL
  Filled 2022-01-11: qty 1

## 2022-01-11 MED ORDER — SODIUM CHLORIDE (PF) 0.9 % IJ SOLN
INTRAMUSCULAR | Status: AC
  Filled 2022-01-11: qty 50

## 2022-01-11 MED ORDER — METOPROLOL SUCCINATE 12.5 MG HALF TABLET
12.5000 mg | ORAL_TABLET | Freq: Every day | ORAL | Status: DC
  Administered 2022-01-11: 12.5 mg via ORAL

## 2022-01-11 MED ORDER — TRAZODONE HCL 50 MG PO TABS
25.0000 mg | ORAL_TABLET | Freq: Every evening | ORAL | Status: DC | PRN
  Administered 2022-01-12 – 2022-01-15 (×4): 25 mg via ORAL
  Filled 2022-01-11 (×4): qty 1

## 2022-01-11 NOTE — Progress Notes (Signed)
Patient is agitated threatening staff. Saying she is going to choke and hurt someone. Patient refused to come get PRN. Took PO zyprexa down to patient. Patient stated "I'm not taking no damn pill. I'm going to karate chop yall assess. Patient banging her head in the room. She took the po Zyprexa. Patient hit the STARR button in her room twice. Per provider order patient received a second dose of prn Zyprexa. Patient continues to yell and curse in her room.

## 2022-01-11 NOTE — ED Notes (Signed)
Attempted to call report to [facility] for patient's return.  No answer at this time. Will attempt again shortly

## 2022-01-11 NOTE — ED Notes (Signed)
Will, PA at bedside. 

## 2022-01-11 NOTE — ED Provider Notes (Signed)
[facility] DEPT Provider Note   CSN: [id number] Arrival date & time: 01/10/22  2232     History  Chief Complaint  Patient presents with   Abdominal Pain    [name] is a 31 y.o. female.  HPI   Patient with medical history including personality disorder, schizophrenia, asthma, anxiety, fibromyalgia presents from [facility] due to stomach pain, started yesterday around 11 AM, states pain is her epigastric/right upper quadrant, does not radiate, is constant, associated nausea and vomiting, still passing gas having normal bowel movements last bowel movement was yesterday no melena or bloody stools, denies any urinary symptoms, denies history of stomach ulcers alcohol use NSAID use, she states that she does have history of pancreatitis and concerned might be that.      Home Medications Prior to Admission medications   Medication Sig Start Date End Date Taking? Authorizing Provider  albuterol (VENTOLIN HFA) 108 (90 Base) MCG/ACT inhaler Inhale 2 puffs into the lungs every 6 (six) hours as needed for wheezing or shortness of breath.    [provider]  Asenapine (SECUADO) 3.8 MG/24HR PT24 Apply 1 patch topically daily. remove old patch prior to applying new one 01/02/22   Massengill, Ovid Curd, MD  budesonide-formoterol [facility]) 160-4.5 MCG/ACT inhaler Inhale 2 puffs into the lungs 2 (two) times daily.    [provider]      Allergies    Bee venom, Coconut flavor, Fish allergy, Geodon [ziprasidone hcl], Haloperidol and related, Lithobid [lithium], Roxicodone [oxycodone], Seroquel [quetiapine], Shellfish allergy, Phenergan [promethazine hcl], Prilosec [omeprazole], Sulfa antibiotics, Tegretol [carbamazepine], Prozac [fluoxetine], Tape, and Tylenol [acetaminophen]    Review of Systems   Review of Systems  Constitutional:  Negative for chills and fever.  Respiratory:  Negative for shortness of breath.    Cardiovascular:  Negative for chest pain.  Gastrointestinal:  Positive for abdominal pain and nausea.  Neurological:  Negative for headaches.    Physical Exam Updated Vital Signs BP 115/64 (BP Location: Left Arm)   Pulse 91   Temp 97.9 F (36.6 C) (Oral)   Resp 20   Ht _0  (1.626 m)   Wt 88.9 kg   SpO2 99%   BMI 33.64 kg/m  Physical Exam Vitals and nursing note reviewed.  Constitutional:      General: She is not in acute distress.    Appearance: She is not ill-appearing.  HENT:     Head: Normocephalic and atraumatic.     Nose: No congestion.  Eyes:     Conjunctiva/sclera: Conjunctivae normal.  Cardiovascular:     Rate and Rhythm: Normal rate and regular rhythm.     Pulses: Normal pulses.     Heart sounds: No murmur heard.    No friction rub. No gallop.  Pulmonary:     Effort: No respiratory distress.     Breath sounds: No wheezing, rhonchi or rales.  Abdominal:     Palpations: Abdomen is soft.     Tenderness: There is abdominal tenderness. There is no right CVA tenderness or left CVA tenderness.     Comments: Abdomen nondistended, soft, she had noted tenderness in her epigastric/right upper quadrant, no guarding movements or peritoneal sign negative Murphy sign McBurney point.  Skin:    General: Skin is warm and dry.  Neurological:     Mental Status: She is alert.  Psychiatric:        Mood and Affect: Mood normal.     ED Results / Procedures / Treatments  Labs (all labs ordered are listed, but only abnormal results are displayed) Labs Reviewed  CBC WITH DIFFERENTIAL/PLATELET - Abnormal; Notable for the following components:      Result Value   Hemoglobin 11.2 (*)    MCH 25.4 (*)    All other components within normal limits  COMPREHENSIVE METABOLIC PANEL - Abnormal; Notable for the following components:   CO2 21 (*)    Glucose, Bld 108 (*)    Calcium 8.8 (*)    Albumin 3.4 (*)    Total Bilirubin 0.2 (*)    All other components within normal limits   URINALYSIS, ROUTINE W REFLEX MICROSCOPIC - Abnormal; Notable for the following components:   Color, Urine STRAW (*)    All other components within normal limits  LIPASE, BLOOD  PREGNANCY, URINE    EKG None  Radiology CT ABDOMEN PELVIS W CONTRAST  Result Date: 01/11/2022 CLINICAL DATA:  31 year old female with abdominal pain since 1500 hours. EXAM: CT ABDOMEN AND PELVIS WITH CONTRAST TECHNIQUE: Multidetector CT imaging of the abdomen and pelvis was performed using the standard protocol following bolus administration of intravenous contrast. RADIATION DOSE REDUCTION: This exam was performed according to the departmental dose-optimization program which includes automated exposure control, adjustment of the mA and/or kV according to patient size and/or use of iterative reconstruction technique. CONTRAST:  172m OMNIPAQUE IOHEXOL 300 MG/ML  SOLN COMPARISON:  CT Abdomen and Pelvis 02/25/2021 and earlier. FINDINGS: Lower chest: Stable, negative. Hepatobiliary: Stable, negative. Pancreas: Negative. Spleen: Negative. Adrenals/Urinary Tract: Normal adrenal glands. Kidneys appears stable, nonobstructed, with symmetric renal enhancement. No nephrolithiasis or pararenal inflammation. Decompressed ureters. Unremarkable bladder. Stomach/Bowel: Large bowel retained stool, mildly increased from the January comparison. No large bowel inflammation. Normal appendix which is looped on itself coronal image 76. Terminal ileum appears negative. No dilated small bowel. Stomach and duodenum are decompressed, negative. No free air, free fluid, or mesenteric inflammation. Vascular/Lymphatic: Major arterial structures and the portal venous system appear patent, normal. No lymphadenopathy identified. Reproductive: Stable, within normal limits. Other: No pelvic free fluid.  Chronic pelvic phleboliths. Musculoskeletal: No acute osseous abnormality identified. IMPRESSION: No acute or inflammatory process identified in the abdomen or  pelvis. Normal appendix. Electronically Signed   By: HGenevie AnnM.D.   On: 01/11/2022 04:38   [place]domen Limited  Result Date: 01/11/2022 CLINICAL DATA:  Right upper quadrant abdominal pain. EXAM: ULTRASOUND ABDOMEN LIMITED RIGHT UPPER QUADRANT COMPARISON:  Ultrasound dated 01/08/2020 and CT abdomen pelvis dated 02/25/2021. FINDINGS: Evaluation is limited due to body habitus. Gallbladder: No gallstones or wall thickening visualized. No sonographic Murphy sign noted by sonographer. Common bile duct: Diameter: 3 mm Liver: There is diffuse increased liver echogenicity most commonly seen in the setting of fatty infiltration. Superimposed inflammation or fibrosis is not excluded. Clinical correlation is recommended. Portal vein is patent on color Doppler imaging with normal direction of blood flow towards the liver. Other: None. IMPRESSION: Fatty liver, otherwise unremarkable right upper quadrant ultrasound. Electronically Signed   By: AAnner CreteM.D.   On: 01/11/2022 01:24    Procedures Procedures    Medications Ordered in ED Medications  alum & mag hydroxide-simeth (MAALOX/MYLANTA) 200-200-20 MG/5ML suspension 30 mL (30 mLs Oral Given 01/07/22 1507)  magnesium hydroxide (MILK OF MAGNESIA) suspension 30 mL (has no administration in time range)  hydrOXYzine (ATARAX) tablet 25 mg (25 mg Oral Given 01/10/22 2028)  albuterol (VENTOLIN HFA) 108 (90 Base) MCG/ACT inhaler 2 puff (has no administration in time range)  fluticasone furoate-vilanterol (BREO  ELLIPTA) 200-25 MCG/ACT 1 puff (1 puff Inhalation Given 01/10/22 0736)  Asenapine PT24 3.8 mg (3.8 mg Transdermal Given 01/10/22 0735)  LORazepam (ATIVAN) tablet 2 mg (has no administration in time range)    Or  LORazepam (ATIVAN) injection 2 mg (has no administration in time range)  metoprolol succinate (TOPROL-XL) 24 hr tablet 12.5 mg (12.5 mg Oral Given 01/10/22 0735)  OLANZapine (ZYPREXA) injection 10 mg (has no administration in time range)   diphenhydrAMINE (BENADRYL) 50 MG/ML injection (has no administration in time range)  diphenhydrAMINE (BENADRYL) 50 MG/ML injection (has no administration in time range)  OLANZapine zydis (ZYPREXA) disintegrating tablet 10 mg (10 mg Oral Given 01/06/22 1440)  ibuprofen (ADVIL) tablet 200 mg (200 mg Oral Given 01/10/22 1548)  penicillin v potassium (VEETID) tablet 500 mg (500 mg Oral Given 01/10/22 2331)  pantoprazole (PROTONIX) EC tablet 40 mg (40 mg Oral Given 01/10/22 0736)  melatonin tablet 3 mg (3 mg Oral Given 01/10/22 2028)  traZODone (DESYREL) tablet 50 mg (has no administration in time range)  ondansetron (ZOFRAN) tablet 4 mg (4 mg Oral Given 01/10/22 1339)  sodium chloride (PF) 0.9 % injection (has no administration in time range)  LORazepam (ATIVAN) tablet 1 mg (1 mg Oral Given 01/03/22 1105)  propranolol (INDERAL) tablet 10 mg (10 mg Oral Given 01/02/22 1340)  QUEtiapine (SEROQUEL) tablet 12.5 mg (12.5 mg Oral Given 01/02/22 2105)  OLANZapine (ZYPREXA) injection 10 mg (10 mg Intramuscular Given 01/03/22 1620)  OLANZapine (ZYPREXA) tablet 2.5 mg (2.5 mg Oral Given 01/10/22 2028)  HYDROmorphone (DILAUDID) injection 0.5 mg (0.5 mg Intravenous Given 01/11/22 0310)  ondansetron (ZOFRAN) injection 4 mg (4 mg Intravenous Given 01/11/22 0309)  iohexol (OMNIPAQUE) 300 MG/ML solution 100 mL (100 mLs Intravenous Contrast Given 01/11/22 0415)    ED Course/ Medical Decision Making/ A&P                           Medical Decision Making Amount and/or Complexity of Data Reviewed Radiology: ordered.  Risk Prescription drug management.   This patient presents to the ED for concern of abdominal pain, this involves an extensive number of treatment options, and is a complaint that carries with it a high risk of complications and morbidity.  The differential diagnosis includes gastritis, pancreatitis, cholecystitis, lower lobe pneumonia    Additional history obtained:  Additional history obtained  from N/A External records from outside source obtained and reviewed including previous ER notes   Co morbidities that complicate the patient evaluation  Psychiatric disorder  Social Determinants of Health:  N/A    Lab Tests:  I Ordered, and personally interpreted labs.  The pertinent results include: CBC shows normocytic anemia hemoglobin 11.2, CMP shows CO2 of 21, glucose 108, albumin 3.4, total T. bili 0.2, lipase is 37, urine pregnancy negative   Imaging Studies ordered:  I ordered imaging studies including limited ultrasound I independently visualized and interpreted imaging which showed negative acute findings, CT scan negative I agree with the radiologist interpretation   Cardiac Monitoring:  The patient was maintained on a cardiac monitor.  I personally viewed and interpreted the cardiac monitored which showed an underlying rhythm of: N/A   Medicines ordered and prescription drug management:  I ordered medication including Dilaudid, antiemetics I have reviewed the patients home medicines and have made adjustments as needed  Critical Interventions:  N/a   Reevaluation:  Presents with abdominal pain, triage obtain basic lab workup, which I personally reviewed is unremarkable,  she does have slight tenderness in the epigastric region/right upper quadrant, concern for possible gallbladder abnormality, will obtain ultrasound for further evaluation.  Patient was reassessed, despite pain medication she still endorsing pain, I reassessed her she still tender epigastric/upper quadrant, I suspect this is likely more gastritis but due to her persistent pain will obtain CT imaging for further evaluation.  Patient was reassessed having no complaints agreement discharge at this time     Consultations Obtained:  N/a    Test Considered:  N/a    Rule out low suspicion for lower lobe pneumonia as lung sounds are clear bilaterally, will defer imaging at this time.   I have low suspicion for liver or gallbladder abnormality as she has no right upper quadrant tenderness, liver enzymes, alk phos, T bili all within normal limits, limited ultrasound was also negative..  Low suspicion for pancreatitis as lipase is within normal limits.  Low suspicion for ruptured stomach ulcer as she has no peritoneal sign present on exam.  Low suspicion for bowel obstruction as abdomen is nondistended normal bowel sounds, so passing gas and having normal bowel movements.  Low suspicion for UTI, pyelo or kidney stone is low no CVA tenderness no urinary symptoms UA is negative for infection or hematuria.  Suspicion for diverticulitis, appendicitis, volvulus, AAA is low as CT imaging is all negative these findings.    Dispostion and problem list  After consideration of the diagnostic results and the patients response to treatment, I feel that the patent would benefit from discharge.  Epigastric pain-likely this is gastritis, will have her continue with her Protonix follow-up with her PCP for further evaluation and strict return precautions.  Patient will return back to the behavioral health hospital since she is medically cleared.            Final Clinical Impression(s) / ED Diagnoses Final diagnoses:  Epigastric pain    Rx / DC Orders ED Discharge Orders     None         Marcello Fennel, PA-C 01/11/22 0524    Orpah Greek, MD 01/11/22 [phone]

## 2022-01-11 NOTE — [facility] Group Notes (Signed)
Pt did not attend group. 

## 2022-01-11 NOTE — Group Note (Signed)
Date:  01/11/2022 Time:  9:55 AM  Group Topic/Focus:  Orientation:   The focus of this group is to educate the patient on the purpose and policies of crisis stabilization and provide a format to answer questions about their admission.  The group details unit policies and expectations of patients while admitted.    Participation Level:  Minimal  Participation Quality:  Intrusive  Affect:  Flat  Cognitive:  Disorganized  Insight: Lacking  Engagement in Group:  Poor  Modes of Intervention:  Discussion  Additional Comments:     [name] 01/11/2022, 9:55 AM

## 2022-01-11 NOTE — Group Note (Signed)
LCSW Group Therapy Note  Group Date: 01/11/2022 Start Time: 1100 End Time: 1200   Type of Therapy and Topic:  Group Therapy - Healthy vs Unhealthy Coping Skills  Participation Level:  Did Not Attend   Description of Group The focus of this group was to determine what unhealthy coping techniques typically are used by group members and what healthy coping techniques would be helpful in coping with various problems. Patients were guided in becoming aware of the differences between healthy and unhealthy coping techniques. Patients were asked to identify 2-3 healthy coping skills they would like to learn to use more effectively.  Therapeutic Goals Patients learned that coping is what human beings do all day long to deal with various situations in their lives Patients defined and discussed healthy vs unhealthy coping techniques Patients identified their preferred coping techniques and identified whether these were healthy or unhealthy Patients determined 2-3 healthy coping skills they would like to become more familiar with and use more often. Patients provided support and ideas to each other   Summary of Patient Progress:  Did not attend   Therapeutic Modalities Cognitive Behavioral Therapy Motivational Interviewing  Windle Guard, LCSW 01/11/2022  1:13 PM

## 2022-01-11 NOTE — ED Notes (Signed)
Safe transport called.  Transportation arranged for patient to return to [facility].  Report given to [facility] RN.

## 2022-01-11 NOTE — Progress Notes (Signed)
[facility] MD Progress Note  01/11/2022 1:53 PM [name]  MRN:  [id number] Principal Problem: Schizoaffective disorder [facility]) Diagnosis: Principal Problem:   Schizoaffective disorder ([place])  Reason For Admission: [name] is a 31 yo Caucasian female with prior mental health health diagnoses of borderline personality d/o & Schizoaffective d/o who was taken to the [facility] by [facility] after she called the crises hotline number to report that she had overdosed on multiple medications in an attempt to end her life. As per ED assessment, "she took 6 '40mg'$  Zofran pills, 6 '25mg'$  Atenolol pills, 6 '10mg'$  Cyclobenzapr pills, 18 '5mg'$  escitalopram pills, 18 '10mg'$  Hydroxyz pills, 6 '40mg'$  pantroprazole pills, 6 '1mg'$  Prazosin hcl pills, 18 '50mg'$  pregabalin pills, 3 '25mg'$  Quetiapine pills, and 6 1.'5mg'$  vraylar pills."  Patient was involuntarily committed and transferred to this behavioral health Hospital for treatment and stabilization of her mood.   24 hrs chart review: V/S for the past 24 hrs WNL. Overnight, pt was irritable, agitated as per staff, complaining of abdominal pain, and required transfer to the ER for assessment. ER work up did not reveal any abnormal findings, and pt was transported back here to this [facility].  Patient assessment note, 01/11/22: During today's encounter, pt presented with a depressed mood, and affect was congruent. She talked to Probation officer with eyes closed, and verbalized continuing to feel depressed. She however, denies SI/HI/VH, but endorsed +AH of voices earlier today morning, but non at time of assessment. She reported that the voices come and go. She spoke about presenting to the [facility] last night, then stated she was "paranoid about the [facility] doing something to me." She reported feeling tired due to poor sleep last night related to coming back from hospital late last night. She reported that appetite is good, stated that her last BM was yesterday, and denied any feeling of  constipation.  This afternoon, pt became angry and irritable, threatening to harm staff by choking them. She exhibited behaviors such as banging her head on the wall, initially refused PRN medications, butj eventually took Zyprexa PRN, and transferred to the 500 hall which is a more restrictive setting.  Our goal remains to taper down medications to the least possible amount necessary to manage her mental status. We are continuing the Saphris patch. Zyprexa is completely weaned off, and we are continuing other medications as listed below.  Total Time spent with patient: 45 minutes  Past Psychiatric History: Schizoaffective d/o borderline personality d/o  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 fragile X syndrome    Chronic constipation    Depression    Drug-seeking behavior    Essential tremor    Headache    Overdose of acetaminophen 07/2017   and other meds   Personality disorder ([place])    Schizo-affective psychosis ([place])    Schizoaffective disorder, bipolar type ([place])    Seizures ([place])    Last seizure December 2017   Sleep apnea     Past Surgical History:  Procedure Laterality Date   MOUTH SURGERY  2009 or 2010   Family History:  Family History  Problem Relation Age of Onset   Mental illness Father    Asthma Father    PDD Brother    Seizures Brother    Family Psychiatric  History: As above Social History:  Social History   Substance and Sexual Activity  Alcohol Use No   Alcohol/week: 1.0 standard drink of alcohol   Types: 1 Standard drinks or equivalent per week   Comment: denies at this time     Social History   Substance and Sexual Activity  Drug Use No   Comment: History of cocaine use at age 42 for 4 months    Social History   Socioeconomic History   Marital status: Widowed    Spouse name: Not on file   Number of children: 0   Years of  education: Not on file   Highest education level: Not on file  Occupational History   Occupation: disability  Tobacco Use   Smoking status: Former    Packs/day: 0.00    Types: Cigarettes   Smokeless tobacco: Never   Tobacco comments:    Smoked for 2  years age [age]  Vaping Use   Vaping Use: Never used  Substance and Sexual Activity   Alcohol use: No    Alcohol/week: 1.0 standard drink of alcohol    Types: 1 Standard drinks or equivalent per week    Comment: denies at this time   Drug use: No    Comment: History of cocaine use at age 66 for 4 months   Sexual activity: Not Currently    Birth control/protection: None  Other Topics Concern   Not on file  Social History Narrative   Marital status: Widowed      Children: daughter      Lives: with boyfriend, in two story home      Employment:  Disability      Tobacco: quit smoking; smoked for two years.      Alcohol ;none      Drugs: none   Has not traveled outside of the country.   Right handed         Social Determinants of Health   Financial Resource Strain: Not on file  Food Insecurity: No Food Insecurity (01/01/2022)   Hunger Vital Sign    Worried About Running Out of Food in the Last Year: Never true    Ran Out of Food in the Last Year: Never true  Recent Concern: Food Insecurity - Food Insecurity Present (12/28/2021)   Hunger Vital Sign    Worried About Charity fundraiser in the Last Year: Often true    Ran Out of Food in the Last Year: Often true  Transportation Needs: Unmet Transportation Needs (01/01/2022)   PRAPARE - Hydrologist (Medical): No    Lack of Transportation (Non-Medical): Yes  Physical Activity: Not on file  Stress: Not on file  Social Connections: Not on file   Additional Social History:   Sleep: Poor  Appetite:  Good  Current Medications: Current Facility-Administered Medications  Medication Dose Route Frequency Provider Last Rate Last Admin   albuterol  (VENTOLIN HFA) 108 (90 Base) MCG/ACT inhaler 1-2 puff  1-2 puff Inhalation Q6H PRN Massengill, Ovid Curd, MD       Asenapine PT24 3.8 mg  3.8 mg Transdermal Daily Massengill, Nathan, MD   3.8 mg at 01/11/22 1000   fluticasone furoate-vilanterol (BREO ELLIPTA) 200-25 MCG/ACT 1 puff  1 puff Inhalation Daily Massengill, Nathan, MD   1 puff at 01/11/22 1000   hydrOXYzine (ATARAX) tablet 25 mg  25 mg Oral TID PRN Janine Limbo, MD   25 mg at 01/11/22 1001   melatonin tablet 3 mg  3 mg Oral QHS  Massengill, Ovid Curd, MD       metoprolol succinate (TOPROL-XL) 24 hr tablet 12.5 mg  12.5 mg Oral Daily Massengill, Nathan, MD       OLANZapine (ZYPREXA) injection 10 mg  10 mg Intramuscular TID PRN Massengill, Ovid Curd, MD       OLANZapine (ZYPREXA) tablet 10 mg  10 mg Oral TID PRN Janine Limbo, MD   10 mg at 01/11/22 1211   ondansetron (ZOFRAN) tablet 4 mg  4 mg Oral Q8H PRN Massengill, Ovid Curd, MD       penicillin v potassium (VEETID) tablet 500 mg  500 mg Oral Q6H Massengill, Nathan, MD   500 mg at 01/11/22 1105   traZODone (DESYREL) tablet 25 mg  25 mg Oral QHS PRN Massengill, Ovid Curd, MD        Lab Results:  Results for orders placed or performed during the hospital encounter of 01/01/22 (from the past 48 hour(s))  CBC with Differential     Status: Abnormal   Collection Time: 01/10/22 11:35 PM  Result Value Ref Range   WBC 9.1 4.0 - 10.5 K/uL   RBC 4.41 3.87 - 5.11 MIL/uL   Hemoglobin 11.2 (L) 12.0 - 15.0 g/dL   HCT 36.1 36.0 - 46.0 %   MCV 81.9 80.0 - 100.0 fL   MCH 25.4 (L) 26.0 - 34.0 pg   MCHC 31.0 30.0 - 36.0 g/dL   RDW 13.9 11.5 - 15.5 %   Platelets 292 150 - 400 K/uL   nRBC 0.0 0.0 - 0.2 %   Neutrophils Relative % 55 %   Neutro Abs 5.0 1.7 - 7.7 K/uL   Lymphocytes Relative 35 %   Lymphs Abs 3.2 0.7 - 4.0 K/uL   Monocytes Relative 7 %   Monocytes Absolute 0.6 0.1 - 1.0 K/uL   Eosinophils Relative 1 %   Eosinophils Absolute 0.1 0.0 - 0.5 K/uL   Basophils Relative 1 %   Basophils  Absolute 0.1 0.0 - 0.1 K/uL   Immature Granulocytes 1 %   Abs Immature Granulocytes 0.06 0.00 - 0.07 K/uL    Comment: Performed at [facility], [place] [address], [place], [place] [address]  Comprehensive metabolic panel     Status: Abnormal   Collection Time: 01/10/22 11:35 PM  Result Value Ref Range   Sodium 135 135 - 145 mmol/L   Potassium 3.9 3.5 - 5.1 mmol/L   Chloride 107 98 - 111 mmol/L   CO2 21 (L) 22 - 32 mmol/L   Glucose, Bld 108 (H) 70 - 99 mg/dL    Comment: Glucose reference range applies only to samples taken after fasting for at least 8 hours.   BUN 15 6 - 20 mg/dL   Creatinine, Ser 0.67 0.44 - 1.00 mg/dL   Calcium 8.8 (L) 8.9 - 10.3 mg/dL   Total Protein 6.7 6.5 - 8.1 g/dL   Albumin 3.4 (L) 3.5 - 5.0 g/dL   AST 16 15 - 41 U/L   ALT 16 0 - 44 U/L   Alkaline Phosphatase 55 38 - 126 U/L   Total Bilirubin 0.2 (L) 0.3 - 1.2 mg/dL   GFR, Estimated >60 >60 mL/min    Comment: (NOTE) Calculated using the CKD-EPI Creatinine Equation (2021)    Anion gap 7 5 - 15    Comment: Performed at [facility], [place] [address], [place], [place] [address]  Lipase, blood     Status: None   Collection Time: 01/10/22 11:35 PM  Result Value  Ref Range   Lipase 37 11 - 51 U/L    Comment: Performed at [facility], [place] [address] [place], [place] [address]  Urinalysis, Routine w reflex microscopic Urine, Clean Catch     Status: Abnormal   Collection Time: 01/11/22 12:36 AM  Result Value Ref Range   Color, Urine STRAW (A) YELLOW   APPearance CLEAR CLEAR   Specific Gravity, Urine 1.013 1.005 - 1.030   pH 5.0 5.0 - 8.0   Glucose, UA NEGATIVE NEGATIVE mg/dL   Hgb urine dipstick NEGATIVE NEGATIVE   Bilirubin Urine NEGATIVE NEGATIVE   Ketones, ur NEGATIVE NEGATIVE mg/dL   Protein, ur NEGATIVE NEGATIVE mg/dL   Nitrite NEGATIVE NEGATIVE   Leukocytes,Ua NEGATIVE NEGATIVE   RBC / HPF 0-5 0 - 5 RBC/hpf   WBC, UA 0-5 0 - 5 WBC/hpf    Bacteria, UA NONE SEEN NONE SEEN   Squamous Epithelial / LPF 0-5 0 - 5   Mucus PRESENT     Comment: Performed at [facility], [place] [address], [place], [place] [address]  Pregnancy, urine     Status: None   Collection Time: 01/11/22 12:36 AM  Result Value Ref Range   Preg Test, Ur NEGATIVE NEGATIVE    Comment:        THE SENSITIVITY OF THIS METHODOLOGY IS >20 mIU/mL. Performed at [facility] [place] [facility], [place] [address] [place], [place] [address]     Blood Alcohol level:  Lab Results  Component Value Date   ETH <10 10/29/2021   ETH <10 02/54/2706    Metabolic Disorder Labs: Lab Results  Component Value Date   HGBA1C 5.0 10/07/2021   MPG 96.8 10/07/2021   MPG 96.8 09/02/2021   Lab Results  Component Value Date   PROLACTIN 66.2 (H) 03/02/2021   PROLACTIN 66.1 (H) 02/04/2020   Lab Results  Component Value Date   CHOL 175 12/10/2021   TRIG 145 12/10/2021   HDL 44 12/10/2021   CHOLHDL 4.0 12/10/2021   VLDL 29 12/10/2021   LDLCALC 102 (H) 12/10/2021   LDLCALC 74 10/07/2021   Physical Findings: AIMS: 0 CIWA: n/a    COWS: n/a   Musculoskeletal: Strength & Muscle Tone: within normal limits Gait & Station: normal Patient leans: N/A  Psychiatric Specialty Exam:  Presentation  General Appearance:  Appropriate for Environment  Eye Contact: Good  Speech: Clear and Coherent  Speech Volume: Normal  Handedness: Right  Mood and Affect  Mood: Irritable; Angry  Affect: Congruent  Thought Process  Thought Processes: Coherent  Descriptions of Associations:Intact  Orientation:None  Thought Content:Paranoid Ideation  History of Schizophrenia/Schizoaffective disorder:Yes  Duration of Psychotic Symptoms:Greater than six months  Hallucinations:Hallucinations: Auditory  Ideas of Reference:Paranoia  Suicidal Thoughts:Suicidal Thoughts: No  Homicidal Thoughts:Homicidal Thoughts: No  Sensorium  Memory: Immediate  Good Judgment: Poor Insight: Poor  Executive Functions  Concentration: Poor  Attention Span: Poor  Recall: Poor  Fund of Knowledge: Poor  Language: Fair  Psychomotor Activity  Psychomotor Activity: Psychomotor Activity: Normal  Assets  Assets: Communication Skills  Sleep  Sleep: Sleep: Poor  Physical Exam: Physical Exam Constitutional:      Appearance: Normal appearance.  HENT:     Head: Normocephalic.     Nose: Nose normal. No congestion or rhinorrhea.  Eyes:     Pupils: Pupils are equal, round, and reactive to light.  Musculoskeletal:        General: Normal range of motion.     Cervical back: Normal  range of motion.  Neurological:     General: No focal deficit present.     Mental Status: She is alert.    Review of Systems  Constitutional:  Negative for fever.  HENT:  Negative for hearing loss and sore throat.   Eyes: Negative.  Negative for blurred vision.  Respiratory:  Negative for cough.   Cardiovascular:  Negative for chest pain.  Gastrointestinal:  Negative for heartburn.  Genitourinary:  Negative for dysuria.  Musculoskeletal:  Negative for myalgias.  Skin:  Negative for rash.  Neurological:  Negative for dizziness.  Psychiatric/Behavioral:  Positive for depression and hallucinations. Negative for memory loss, substance abuse and suicidal ideas. The patient is nervous/anxious and has insomnia.    There were no vitals taken for this visit. There is no height or weight on file to calculate BMI.  Treatment Plan Summary: Daily contact with patient to assess and evaluate symptoms and progress in treatment and Medication management   Observation Level/Precautions:  15 minute checks  Laboratory:  Labs reviewed   Psychotherapy:  Unit Group sessions  Med[facility]:  [facility]  Consultations:  To be determined   Discharge Concerns:  Safety, medication compliance, mood stability  Estimated LOS: 5-7 days  Other:  N/A    Labs Reviewed on 01/11/2022:  CMP & CBC reviewed. Beta hcg negative, Tox screen negative, UA with small leukocytes and rare bacteria, pt asymptomatic for a UTI. TSH WNL, Lipid panel WNL, HA1C from 10/07/21 WNL. QTC from 11/27 with QTC slightly prolonged at 456. Repeat EKG ordered and Qtc even more prolonged at 477. Pt is asymptomatic.     PLAN Safety and Monitoring: Involuntary admission to inpatient psychiatric unit for safety, stabilization and treatment Daily contact with patient to assess and evaluate symptoms and progress in treatment Patient's case to be discussed in multi-disciplinary team meeting Observation Level : q15 minute checks Vital signs: q12 hours Precautions: Safety No Roommate d/t psychosis and aggressive behaviors   Long Term Goal(s): Improvement in symptoms so as ready for discharge   Short Term Goals: Ability to identify changes in lifestyle to reduce recurrence of condition will improve, Ability to verbalize feelings will improve, Ability to disclose and discuss suicidal ideas, Ability to demonstrate self-control will improve, Ability to identify and develop effective coping behaviors will improve, and Compliance with prescribed medications will improve   Physician Treatment Plan for Secondary Diagnosis:  Principal Problem:   Schizoaffective disorder, bipolar type ([place]) Active Problems:   Borderline personality disorder (H[facility])   PTSD (post-traumatic stress disorder)   Asthma   Tachycardia   GAD (generalized anxiety disorder)   Self-injurious behavior   Fibromyalgia   Medications -Continue Saphris patch 3.8 mg for psychosis & mood (patch is a safe choice for pt currently d/t h/o multiple overdoses on PO medications. QTC with QTC-456, repeat EKG with Qtc 477. Will repeat EKG tomorrow, pt asymptomatic) -Zyprexa Discontinued. -Continue Protonix 40 mg daily for GERD -Continue Melatonin 3 mg nightly for insomnia -Continue Hydroxyzine 25 mg TID PRN for anxiety -Continue Trazodone to 25 mg nightly  PRN for sleep -Continue Toprol XL 12.5 mg daily for tachycardia on 01/04/22 -Continue Breo Ellipta inhaler daily for asthma -Continue Albuterol PRN for SOB/Wheezing -Continue  Zyprexa 10 mg IM/PO PRN TID for agitation -Continue Penicillin V Potassium 500 mg BID for dental infection x 7 days-ends 12/09 -Discontinued Seroquel 25 mg BID on 11/29 -Discontinued Inderal 5 mg Q 12 H for tachycardia on 11/29.   Other PRNS -Continue Tylenol 650 mg  every 6 hours PRN for mild pain -Continue Maalox 30 mg every 4 hrs PRN for indigestion -Continue Milk of Magnesia as needed every 6 hrs for constipation   Discharge Planning: Social work and case management to assist with discharge planning and identification of hospital follow-up needs prior to discharge Estimated LOS: 5-7 days Discharge Concerns: Need to establish a safety plan; Medication compliance and effectiveness Discharge Goals: Return home with outpatient referrals for mental health follow-up including medication management/psychotherapy  Nicholes Rough, NP 01/11/2022, 1:53 PM

## 2022-01-11 NOTE — Progress Notes (Signed)
   01/11/22 1300  Psych Admission Type (Psych Patients Only)  Admission Status Voluntary  Psychosocial Assessment  Patient Complaints Agitation;Anxiety;Crying spells;Irritability;Worrying  Eye Contact Avoids  Facial Expression Flat;Sad  Affect Anxious;Depressed;Irritable;Labile  Scientific laboratory technician Assertive;Attention-seeking;Hostile  Motor Activity Fidgety;Hand-wringing  Appearance/Hygiene Disheveled  Behavior Characteristics Agressive verbally;Agitated;Anxious;Fidgety;Guarded  Mood Depressed;Anxious;Labile;Angry;Irritable  Aggressive Behavior  Effect No apparent injury  Thought Process  Coherency Blocking;Disorganized;Flight of ideas  Content Blaming others;Compulsions;Preoccupation  Delusions UTA  Perception Depersonalization;Derealization  Hallucination None reported or observed  Judgment Impaired  Confusion WDL  Danger to Self  Current suicidal ideation? Denies  Danger to Others  Danger to Others Reported or observed  Danger to Others Abnormal  Harmful Behavior to others Threats of violence towards other people observed or expressed   Destructive Behavior Threats of violence towards property observed or expressed   Description of Harmful Behavior  (Patient threatening staff.)  Description of Destructive Behavior  (Hitting the walls, pressing the STARR button)

## 2022-01-11 NOTE — Progress Notes (Signed)
Per report from the previous shift, patient was agitated and verbally threatened staff. Patient observed sleeping in her room, patient did not participate in group this evening, patient  was up to the med window, received scheduled medications and  PRN Hydroxyzine at HS. Patient was guarded and refused to communicate with this Probation officer. Support and encouragement offered, safety checks maintained at Q 15 minutes intervals.

## 2022-01-12 DIAGNOSIS — M25579 Pain in unspecified ankle and joints of unspecified foot: Secondary | ICD-10-CM | POA: Diagnosis present

## 2022-01-12 DIAGNOSIS — G4700 Insomnia, unspecified: Secondary | ICD-10-CM

## 2022-01-12 DIAGNOSIS — F251 Schizoaffective disorder, depressive type: Secondary | ICD-10-CM | POA: Diagnosis not present

## 2022-01-12 HISTORY — DX: Insomnia, unspecified: G47.00

## 2022-01-12 MED ORDER — BACITRACIN-NEOMYCIN-POLYMYXIN OINTMENT TUBE
TOPICAL_OINTMENT | CUTANEOUS | Status: DC | PRN
  Filled 2022-01-12: qty 14.17

## 2022-01-12 MED ORDER — IBUPROFEN 200 MG PO TABS
200.0000 mg | ORAL_TABLET | Freq: Four times a day (QID) | ORAL | Status: DC | PRN
  Administered 2022-01-12 – 2022-01-14 (×4): 200 mg via ORAL
  Filled 2022-01-12 (×4): qty 1

## 2022-01-12 NOTE — Group Note (Signed)
LCSW Group Therapy Note   Group Date: 01/12/2022 Start Time: 1100 End Time: 1200  Type of Therapy and Topic:  Group Therapy: Are You Under Stress?!?!  Participation Level:  Minimal  Description of Group: This process group involved patients examining stress and how it impacts their lives. Distress and eustress definitions were explained and patients identified both good and bad stressors in their lives. Accompanying worksheet was used to help patients identify the physical and emotional symptoms of stress and techniques/copings skills they utilize to help reduce these symptoms.    Therapeutic Goals:  1.  Patients will talk about their experiences with distress and eustress. 2. Patients will identify stressors in their lives and the physical and emotional  symptoms they experience while under stress. 3. Patients to identify actions/coping skills that they utilize to help ameliorate  symptoms of stress.  Summary of Patient Progress:   Patient came 15 minutes later into group. Patient accepted and followed facilitator worksheet. Patient participated when facilitator asked her a question. Patient was respectful and gave positive insight about coping with stress. Patient did leave half way from group and sat on floor in hallway.   Therapeutic Modalities:  Cognitive Behavioral Therapy Solution-Focused Therapy   Charlett Lango 01/12/2022  1:53 PM

## 2022-01-12 NOTE — Progress Notes (Signed)
   01/12/22 2259  Psych Admission Type (Psych Patients Only)  Admission Status Voluntary  Psychosocial Assessment  Patient Complaints Depression  Eye Contact Avoids  Facial Expression Flat  Affect Appropriate to circumstance;Depressed  Speech Logical/coherent;Soft  Interaction Assertive  Motor Activity Slow  Appearance/Hygiene Disheveled  Behavior Characteristics Cooperative;Appropriate to situation  Mood Anxious  Aggressive Behavior  Effect No apparent injury  Thought Process  Coherency Circumstantial  Content Blaming others  Delusions None reported or observed  Perception WDL  Hallucination None reported or observed  Judgment Impaired  Confusion None  Danger to Self  Current suicidal ideation? Denies  Danger to Others  Danger to Others None reported or observed

## 2022-01-12 NOTE — Progress Notes (Signed)
[facility] MD Progress Note  01/12/2022 2:45 PM [name]  MRN:  [id number] Principal Problem: Schizoaffective disorder [facility]) Diagnosis: Active Problems:   Schizoaffective disorder, bipolar type (HCC)   Asthma   Anxiety state   Self-injurious behavior   Insomnia   Pain in joint, ankle and foot  Reason For Admission: [name] is a 31 yo Caucasian female with prior mental health health diagnoses of borderline personality d/o & Schizoaffective d/o who was taken to the [facility] by [facility] after she called the crises hotline number to report that she had overdosed on multiple medications in an attempt to end her life. As per ED assessment, "she took 6 '40mg'$  Zofran pills, 6 '25mg'$  Atenolol pills, 6 '10mg'$  Cyclobenzapr pills, 18 '5mg'$  escitalopram pills, 18 '10mg'$  Hydroxyz pills, 6 '40mg'$  pantroprazole pills, 6 '1mg'$  Prazosin hcl pills, 18 '50mg'$  pregabalin pills, 3 '25mg'$  Quetiapine pills, and 6 1.'5mg'$  vraylar pills."  Patient was involuntarily committed and transferred to this behavioral health Hospital for treatment and stabilization of her mood.   24 hrs chart review: V/S for the past 24 hrs WNL. Overnight, pt was guarded overnight, refused to speak with her assigned RN, Hydroxyzine 25 mg last night for anxiety, and received Zyprexa 10 mg earlier today morning for agitation. As per nursing documentation, she slept for a total of 9 hrs last night.  Patient assessment note, 01/12/22: During today's encounter, pt presents with an irritable and depressed mood. She is tearful, states that every one at this hospital and her family members are talking about her and saying negative things about her. Prior to entering her room, writer observed pt talking to herself in the room. She is tearful through entire assessment, reports +AH of voices telling her that "You're not loved. You're stupid." She denies SI, but states that she has self harm urges, and shows writer superficial cuts to her left wrist which she states that she  self inflicted yesterday to release stress. She talks at length about her parents being within driving distance to her and never come to see her. She reports feeling unloved. She states: "I am very smart, but I am very stupid". Empathy and active listening, provided to pt.   Pt reports that her sleep quality last night was good, reports a good appetite, reports left shoulder of 7 (10 being worst), and medicated with Ibuprofen by her assigned RN. Our goal remains to taper down medications to the least possible amount necessary to manage her mental status. However, there are concerns that the Saphris patch might not be sufficient in managing current symptoms. Attending Psychiatrist to talk to pt about transitioning to the [place]. LAI offered to pt at start of this hospitalization and she was extremely resistant to the idea.  Total Time spent with patient: 45 minutes  Past Psychiatric History: Schizoaffective d/o borderline personality d/o  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 fragile X syndrome    Chronic constipation    Depression    Drug-seeking behavior    Essential tremor    Headache    Overdose of acetaminophen 07/2017   and other meds   Personality disorder ([place])    Schizo-affective psychosis ([place])    Schizoaffective disorder, bipolar type ([place])    Seizures ([place])    Last seizure December 2017   Sleep apnea  Past Surgical History:  Procedure Laterality Date   MOUTH SURGERY  2009 or 2010   Family History:  Family History  Problem Relation Age of Onset   Mental illness Father    Asthma Father    PDD Brother    Seizures Brother    Family Psychiatric  History: As above Social History:  Social History   Substance and Sexual Activity  Alcohol Use No   Alcohol/week: 1.0 standard drink of alcohol   Types: 1 Standard drinks or equivalent per week    Comment: denies at this time     Social History   Substance and Sexual Activity  Drug Use No   Comment: History of cocaine use at age 51 for 4 months    Social History   Socioeconomic History   Marital status: Widowed    Spouse name: Not on file   Number of children: 0   Years of education: Not on file   Highest education level: Not on file  Occupational History   Occupation: disability  Tobacco Use   Smoking status: Former    Packs/day: 0.00    Types: Cigarettes   Smokeless tobacco: Never   Tobacco comments:    Smoked for 2  years age [age]  Vaping Use   Vaping Use: Never used  Substance and Sexual Activity   Alcohol use: No    Alcohol/week: 1.0 standard drink of alcohol    Types: 1 Standard drinks or equivalent per week    Comment: denies at this time   Drug use: No    Comment: History of cocaine use at age 26 for 4 months   Sexual activity: Not Currently    Birth control/protection: None  Other Topics Concern   Not on file  Social History Narrative   Marital status: Widowed      Children: daughter      Lives: with boyfriend, in two story home      Employment:  Disability      Tobacco: quit smoking; smoked for two years.      Alcohol ;none      Drugs: none   Has not traveled outside of the country.   Right handed         Social Determinants of Health   Financial Resource Strain: Not on file  Food Insecurity: No Food Insecurity (01/01/2022)   Hunger Vital Sign    Worried About Running Out of Food in the Last Year: Never true    Ran Out of Food in the Last Year: Never true  Recent Concern: Food Insecurity - Food Insecurity Present (12/28/2021)   Hunger Vital Sign    Worried About Charity fundraiser in the Last Year: Often true    Ran Out of Food in the Last Year: Often true  Transportation Needs: Unmet Transportation Needs (01/01/2022)   PRAPARE - Hydrologist (Medical): No    Lack of Transportation (Non-Medical): Yes   Physical Activity: Not on file  Stress: Not on file  Social Connections: Not on file   Additional Social History:   Sleep: Poor  Appetite:  Good  Current Medications: Current Facility-Administered Medications  Medication Dose Route Frequency Provider Last Rate Last Admin   albuterol (VENTOLIN HFA) 108 (90 Base) MCG/ACT inhaler 1-2 puff  1-2 puff Inhalation Q6H PRN Massengill, Ovid Curd, MD       Asenapine PT24 3.8 mg  3.8 mg Transdermal Daily Massengill, Nathan, MD   3.8 mg at 01/12/22  1043   fluticasone furoate-vilanterol (BREO ELLIPTA) 200-25 MCG/ACT 1 puff  1 puff Inhalation Daily Massengill, Nathan, MD   1 puff at 01/12/22 1041   hydrOXYzine (ATARAX) tablet 25 mg  25 mg Oral TID PRN Janine Limbo, MD   25 mg at 01/11/22 2044   ibuprofen (ADVIL) tablet 200 mg  200 mg Oral Q6H PRN Nicholes Rough, NP   200 mg at 01/12/22 1159   melatonin tablet 3 mg  3 mg Oral QHS Massengill, Ovid Curd, MD   3 mg at 01/11/22 2044   metoprolol succinate (TOPROL-XL) 24 hr tablet 12.5 mg  12.5 mg Oral Daily Massengill, Ovid Curd, MD   12.5 mg at 01/12/22 1042   OLANZapine (ZYPREXA) injection 10 mg  10 mg Intramuscular TID PRN Massengill, Ovid Curd, MD       OLANZapine (ZYPREXA) tablet 10 mg  10 mg Oral TID PRN Janine Limbo, MD   10 mg at 01/12/22 1040   ondansetron (ZOFRAN) tablet 4 mg  4 mg Oral Q8H PRN Massengill, Ovid Curd, MD       penicillin v potassium (VEETID) tablet 500 mg  500 mg Oral Q6H Massengill, Nathan, MD   500 mg at 01/12/22 1042   traZODone (DESYREL) tablet 25 mg  25 mg Oral QHS PRN Massengill, Ovid Curd, MD        Lab Results:  Results for orders placed or performed during the hospital encounter of 01/01/22 (from the past 48 hour(s))  CBC with Differential     Status: Abnormal   Collection Time: 01/10/22 11:35 PM  Result Value Ref Range   WBC 9.1 4.0 - 10.5 K/uL   RBC 4.41 3.87 - 5.11 MIL/uL   Hemoglobin 11.2 (L) 12.0 - 15.0 g/dL   HCT 36.1 36.0 - 46.0 %   MCV 81.9 80.0 - 100.0 fL   MCH  25.4 (L) 26.0 - 34.0 pg   MCHC 31.0 30.0 - 36.0 g/dL   RDW 13.9 11.5 - 15.5 %   Platelets 292 150 - 400 K/uL   nRBC 0.0 0.0 - 0.2 %   Neutrophils Relative % 55 %   Neutro Abs 5.0 1.7 - 7.7 K/uL   Lymphocytes Relative 35 %   Lymphs Abs 3.2 0.7 - 4.0 K/uL   Monocytes Relative 7 %   Monocytes Absolute 0.6 0.1 - 1.0 K/uL   Eosinophils Relative 1 %   Eosinophils Absolute 0.1 0.0 - 0.5 K/uL   Basophils Relative 1 %   Basophils Absolute 0.1 0.0 - 0.1 K/uL   Immature Granulocytes 1 %   Abs Immature Granulocytes 0.06 0.00 - 0.07 K/uL    Comment: Performed at [facility], [place] [address] [place], [place] [address]  Comprehensive metabolic panel     Status: Abnormal   Collection Time: 01/10/22 11:35 PM  Result Value Ref Range   Sodium 135 135 - 145 mmol/L   Potassium 3.9 3.5 - 5.1 mmol/L   Chloride 107 98 - 111 mmol/L   CO2 21 (L) 22 - 32 mmol/L   Glucose, Bld 108 (H) 70 - 99 mg/dL    Comment: Glucose reference range applies only to samples taken after fasting for at least 8 hours.   BUN 15 6 - 20 mg/dL   Creatinine, Ser 0.67 0.44 - 1.00 mg/dL   Calcium 8.8 (L) 8.9 - 10.3 mg/dL   Total Protein 6.7 6.5 - 8.1 g/dL   Albumin 3.4 (L) 3.5 - 5.0 g/dL   AST 16 15 - 41 U/L  ALT 16 0 - 44 U/L   Alkaline Phosphatase 55 38 - 126 U/L   Total Bilirubin 0.2 (L) 0.3 - 1.2 mg/dL   GFR, Estimated >60 >60 mL/min    Comment: (NOTE) Calculated using the CKD-EPI Creatinine Equation (2021)    Anion gap 7 5 - 15    Comment: Performed at [facility], [place] [address] [place], [place] [address]  Lipase, blood     Status: None   Collection Time: 01/10/22 11:35 PM  Result Value Ref Range   Lipase 37 11 - 51 U/L    Comment: Performed at [facility], [place] [address] [place], [place] [address]  Urinalysis, Routine w reflex microscopic Urine, Clean Catch     Status: Abnormal   Collection Time: 01/11/22 12:36 AM  Result Value Ref Range   Color, Urine  STRAW (A) YELLOW   APPearance CLEAR CLEAR   Specific Gravity, Urine 1.013 1.005 - 1.030   pH 5.0 5.0 - 8.0   Glucose, UA NEGATIVE NEGATIVE mg/dL   Hgb urine dipstick NEGATIVE NEGATIVE   Bilirubin Urine NEGATIVE NEGATIVE   Ketones, ur NEGATIVE NEGATIVE mg/dL   Protein, ur NEGATIVE NEGATIVE mg/dL   Nitrite NEGATIVE NEGATIVE   Leukocytes,Ua NEGATIVE NEGATIVE   RBC / HPF 0-5 0 - 5 RBC/hpf   WBC, UA 0-5 0 - 5 WBC/hpf   Bacteria, UA NONE SEEN NONE SEEN   Squamous Epithelial / LPF 0-5 0 - 5   Mucus PRESENT     Comment: Performed at [facility], [place] [address] [place], [place] [address]  Pregnancy, urine     Status: None   Collection Time: 01/11/22 12:36 AM  Result Value Ref Range   Preg Test, Ur NEGATIVE NEGATIVE    Comment:        THE SENSITIVITY OF THIS METHODOLOGY IS >20 mIU/mL. Performed at [facility], [place] [address] [place],  [address]     Blood Alcohol level:  Lab Results  Component Value Date   ETH <10 10/29/2021   ETH <10 57/26/2035    Metabolic Disorder Labs: Lab Results  Component Value Date   HGBA1C 5.0 10/07/2021   MPG 96.8 10/07/2021   MPG 96.8 09/02/2021   Lab Results  Component Value Date   PROLACTIN 66.2 (H) 03/02/2021   PROLACTIN 66.1 (H) 02/04/2020   Lab Results  Component Value Date   CHOL 175 12/10/2021   TRIG 145 12/10/2021   HDL 44 12/10/2021   CHOLHDL 4.0 12/10/2021   VLDL 29 12/10/2021   LDLCALC 102 (H) 12/10/2021   LDLCALC 74 10/07/2021   Physical Findings: AIMS: 0 CIWA: n/a    COWS: n/a   Musculoskeletal: Strength & Muscle Tone: within normal limits Gait & Station: normal Patient leans: N/A  Psychiatric Specialty Exam:  Presentation  General Appearance:  Disheveled  Eye Contact: Poor  Speech: Pressured  Speech Volume: Normal  Handedness: Right  Mood and Affect  Mood: Anxious; Irritable; Angry  Affect: Congruent  Thought Process  Thought  Processes: Disorganized  Descriptions of Associations:Intact  Orientation:Partial  Thought Content:Illogical  History of Schizophrenia/Schizoaffective disorder:Yes  Duration of Psychotic Symptoms:Greater than six months  Hallucinations:Hallucinations: Auditory Description of Auditory Hallucinations: demeaning voices telling her that she is worthless and not loved  Ideas of Reference:Paranoia  Suicidal Thoughts:Suicidal Thoughts: No  Homicidal Thoughts:Homicidal Thoughts: No  Sensorium  Memory: Immediate Good Judgment: Poor Insight: Poor  Executive Functions  Concentration: Poor  Attention Span: Poor  Recall: Poor  Fund of Knowledge: Poor  Language: Fair  Psychomotor Activity  Psychomotor Activity: Psychomotor Activity: Normal  Assets  Assets: Resilience  Sleep  Sleep: Sleep: Fair  Physical Exam: Physical Exam Constitutional:      Appearance: Normal appearance.  HENT:     Head: Normocephalic.     Nose: Nose normal. No congestion or rhinorrhea.  Eyes:     Pupils: Pupils are equal, round, and reactive to light.  Musculoskeletal:        General: Normal range of motion.     Cervical back: Normal range of motion.  Neurological:     General: No focal deficit present.     Mental Status: She is alert.    Review of Systems  Constitutional:  Negative for fever.  HENT:  Negative for hearing loss and sore throat.   Eyes: Negative.  Negative for blurred vision.  Respiratory:  Negative for cough.   Cardiovascular:  Negative for chest pain.  Gastrointestinal:  Negative for heartburn.  Genitourinary:  Negative for dysuria.  Musculoskeletal:  Negative for myalgias.  Skin:  Negative for rash.  Neurological:  Negative for dizziness.  Psychiatric/Behavioral:  Positive for depression and hallucinations. Negative for memory loss, substance abuse and suicidal ideas. The patient is nervous/anxious and has insomnia.    Blood pressure 120/79, pulse 96,  temperature 98.1 F (36.7 C), temperature source Oral, SpO2 99 %. There is no height or weight on file to calculate BMI.  Treatment Plan Summary: Daily contact with patient to assess and evaluate symptoms and progress in treatment and Medication management   Observation Level/Precautions:  15 minute checks  Laboratory:  Labs reviewed   Psychotherapy:  Unit Group sessions  Medications:  See [facility]  Consultations:  To be determined   Discharge Concerns:  Safety, medication compliance, mood stability  Estimated LOS: 5-7 days  Other:  N/A    Labs Reviewed on 01/11/2022: CMP & CBC reviewed. Beta hcg negative, Tox screen negative, UA with small leukocytes and rare bacteria, pt asymptomatic for a UTI. TSH WNL, Lipid panel WNL, HA1C from 10/07/21 WNL. QTC from 11/27 with QTC slightly prolonged at 456. Repeat EKG ordered and Qtc even more prolonged at 477. Pt is asymptomatic.     PLAN Safety and Monitoring: Involuntary admission to inpatient psychiatric unit for safety, stabilization and treatment Daily contact with patient to assess and evaluate symptoms and progress in treatment Patient's case to be discussed in multi-disciplinary team meeting Observation Level : q15 minute checks Vital signs: q12 hours Precautions: Safety No Roommate d/t psychosis and aggressive behaviors   Long Term Goal(s): Improvement in symptoms so as ready for discharge   Short Term Goals: Ability to identify changes in lifestyle to reduce recurrence of condition will improve, Ability to verbalize feelings will improve, Ability to disclose and discuss suicidal ideas, Ability to demonstrate self-control will improve, Ability to identify and develop effective coping behaviors will improve, and Compliance with prescribed medications will improve   Physician Treatment Plan for Secondary Diagnosis:  Principal Problem:   Schizoaffective disorder, bipolar type ([place]) Active Problems:   Borderline personality disorder (HCC)    PTSD (post-traumatic stress disorder)   Asthma   Tachycardia   GAD (generalized anxiety disorder)   Self-injurious behavior   Fibromyalgia   Medications -Continue Saphris patch 3.8 mg for psychosis & mood-Concerns regarding management of current symptoms with patch. Attending psychiatrist to talk to pt and re educate on the [place] and determine if she will be agreeable  to this option, as she has been resistant to this prior to the patch (patch is a safe choice for pt currently d/t h/o multiple overdoses on PO medications. QTC with QTC-456, repeat EKG with Qtc 477. Will repeat EKG tomorrow, pt asymptomatic) -Zyprexa Discontinued. -Continue Protonix 40 mg daily for GERD -Continue Melatonin 3 mg nightly for insomnia -Continue Hydroxyzine 25 mg TID PRN for anxiety -Continue Trazodone to 25 mg nightly PRN for sleep -Continue Toprol XL 12.5 mg daily for tachycardia on 01/04/22 -Continue Breo Ellipta inhaler daily for asthma -Continue Albuterol PRN for SOB/Wheezing -Continue  Zyprexa 10 mg IM/PO PRN TID for agitation -Continue Penicillin V Potassium 500 mg BID for dental infection x 7 days-ends 12/09 -Discontinued Seroquel 25 mg BID on 11/29 -Discontinued Inderal 5 mg Q 12 H for tachycardia on 11/29.   Other PRNS -Continue Tylenol 650 mg every 6 hours PRN for mild pain -Continue Maalox 30 mg every 4 hrs PRN for indigestion -Continue Milk of Magnesia as needed every 6 hrs for constipation   Discharge Planning: Social work and case management to assist with discharge planning and identification of hospital follow-up needs prior to discharge Estimated LOS: 5-7 days Discharge Concerns: Need to establish a safety plan; Medication compliance and effectiveness Discharge Goals: Return home with outpatient referrals for mental health follow-up including medication management/psychotherapy  Nicholes Rough, NP 01/12/2022, 2:45 PMPatient ID: [name], female   DOB: 02-Nov-1990, 31 y.o.    MRN: [id number]
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
## 2022-01-12 NOTE — Group Note (Signed)
Recreation Therapy Group Note   Group Topic:Self-Esteem  Group Date: 01/12/2022 Start Time: 1005 End Time: 1025 Facilitators: Keishawna Carranza-McCall, LRT,CTRS Location: [address] Dayroom   Goal Area(s) Addresses:  Patient will successfully identify positive attributes about themselves.  Patient will identify healthy ways to increase self-esteem. Patient will acknowledge benefit(s) of improved self-esteem.   Group Description:  My Strengths and Qualities.  LRT discussed what self esteem is and why it is important.  Patients were then given a worksheet where they were to identify things they are good at, what they like about their appearance, how they've helped others, what they value, compliments received, challenges overcome, things that make them unique and times they've made others happy.   Affect/Mood: N/A   Participation Level: Did not attend    Clinical Observations/Individualized Feedback:      Plan: Continue to engage patient in RT group sessions 2-3x/week.   Rajeev Escue-McCall, LRT, CTRS 01/12/2022 10:30 AM

## 2022-01-12 NOTE — BHH Group Notes (Signed)
Adult Psychoeducational Group Note  Date:  01/12/2022 Time:  8:12 PM  Group Topic/Focus:  Wrap-Up Group:   The focus of this group is to help patients review their daily goal of treatment and discuss progress on daily workbooks.  Participation Level:  Active  Participation Quality:  Attentive  Affect:  Appropriate  Cognitive:  Alert  Insight: Appropriate  Engagement in Group:  Engaged  Modes of Intervention:  Discussion  Additional Comments:  Patient attended and participated in the [place] group.  Annie Sable 01/12/2022, 8:12 PM

## 2022-01-12 NOTE — Progress Notes (Signed)
Pt observed to be guarded, isolative to room this shift. Affect is blunted with irritable mood, fair eye contact and tangential speech related to poor family dynamics and other relationships "My family, my ex don't think I got potential. I'm leaving , I'm not going to be there" throughout this shift. Denies SI, HI and AVH. Received PRN Motrin 200 mg PO at 1159 for bilateral shoulder pain 7/10 and reported relief when reassessed at 1255. Remains medication compliant without adverse reactions. Attended scheduled CSW group, engaged minimally but was appropriate. Safety checks maintained at Q 15 minutes intervals without incident. Support, encouragement and reassurance provided to pt. Medications administered as ordered, effects monitored without adverse reactions. Pt tolerates meals, fluids and medications well without discomfort. Safety maintained in milieu.

## 2022-01-12 NOTE — Progress Notes (Addendum)
Patient did not attend morning orientation group.  

## 2022-01-13 DIAGNOSIS — F251 Schizoaffective disorder, depressive type: Secondary | ICD-10-CM | POA: Diagnosis not present

## 2022-01-13 DIAGNOSIS — F250 Schizoaffective disorder, bipolar type: Secondary | ICD-10-CM

## 2022-01-13 NOTE — BHH Group Notes (Signed)
Patient did not attend group.

## 2022-01-13 NOTE — Progress Notes (Signed)
[facility] MD Progress Note  01/13/2022 1:45 PM [name]  MRN:  [id number] Principal Problem: Schizoaffective disorder, bipolar type ([place]) Diagnosis: Principal Problem:   Schizoaffective disorder, bipolar type ([place]) Active Problems:   Borderline personality disorder ([place])   PTSD (post-traumatic stress disorder)   Asthma   Anxiety state   Self-injurious behavior   Insomnia   Pain in joint, ankle and foot  Reason For Admission: [name] is a 31 yo Caucasian female with prior mental health health diagnoses of borderline personality d/o & Schizoaffective d/o who was taken to the [facility] by [facility] after she called the crises hotline number to report that she had overdosed on multiple medications in an attempt to end her life. As per ED assessment, "she took 6 '40mg'$  Zofran pills, 6 '25mg'$  Atenolol pills, 6 '10mg'$  Cyclobenzapr pills, 18 '5mg'$  escitalopram pills, 18 '10mg'$  Hydroxyz pills, 6 '40mg'$  pantroprazole pills, 6 '1mg'$  Prazosin hcl pills, 18 '50mg'$  pregabalin pills, 3 '25mg'$  Quetiapine pills, and 6 1.'5mg'$  vraylar pills."  Patient was involuntarily committed and transferred to this behavioral health Hospital for treatment and stabilization of her mood.   24 hrs chart review: V/S for the past 24 hrs WNL. Overnight, pt presented with anxiety, was circumstantial, and appeared disheveled as per nursing documentation. She required PRN agitation protocol medication (Zyprexa 10 mg) today afternoon for banging head on the wall, and demanding to leave hospital because she wants "one on one therapy". She was in attendance of last night's unit group session, but did not attend group today morning.  Patient assessment note, 01/13/22: During today's encounter, pt continues to be irritable, blaming others, rather than taking some responsibility for her behaviors. Mood is depressed, Insight and judgment are poor, concentration level is poor, she appears disheveled, and attention to personal hygiene and grooming is poor.  Speech is rapid, but low in volume, and requiring multiple prompts to speak at a slower pace which can be comprehended. Pt denies SI, denies HI, denies AVH, continues to present with paranoia and delusions of persecution, states that her family is out to harm her, because they don't love her.   Writer went back to speak with pt after she exhibited the self injurious behaviors via banging head. She states that she wants to be discharged, and writer educated her on the need to show that she is in control of her behaviors by not banging head on wall or exhibiting other self injurious behaviors such as scratching self, and to be compliant with care. Writer educated pt that is she can show that she is control of behaviors for the next couple of days, she will be discharged early in the upcoming week. Pt reports that she needs to leave to go get Christmas gifts for her daughter. Writer acknowledged this and reiterated the need for her to be in control of her behaviors. Pieces of writing paper and a pencil provided to pt to writer thoughts instead of internalizing them. She verbally contracts not to self injure with pencil. Pt reports that sleep last night was good and reports a fair appetite. Writer again discussed LAI meds as a good option for pt, but she remains resistant to this idea, stating that she does not like needles. We are continuing Saphris patch and other medications as listed below with no changes in medications for today. Continuous hospitalization remains necessary to ensure mood stability prior to discharge, as mood has fluctuated a lot over the past couple of days.  Total Time spent with patient:  45 minutes  Past Psychiatric History: Schizoaffective d/o borderline personality d/o  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 fragile X syndrome    Chronic constipation    Depression     Drug-seeking behavior    Essential tremor    Headache    Overdose of acetaminophen 07/2017   and other meds   Personality disorder ([place])    Schizo-affective psychosis ([place])    Schizoaffective disorder, bipolar type ([place])    Seizures ([place])    Last seizure December 2017   Sleep apnea     Past Surgical History:  Procedure Laterality Date   MOUTH SURGERY  2009 or 2010   Family History:  Family History  Problem Relation Age of Onset   Mental illness Father    Asthma Father    PDD Brother    Seizures Brother    Family Psychiatric  History: As above Social History:  Social History   Substance and Sexual Activity  Alcohol Use No   Alcohol/week: 1.0 standard drink of alcohol   Types: 1 Standard drinks or equivalent per week   Comment: denies at this time     Social History   Substance and Sexual Activity  Drug Use No   Comment: History of cocaine use at age 41 for 4 months    Social History   Socioeconomic History   Marital status: Widowed    Spouse name: Not on file   Number of children: 0   Years of education: Not on file   Highest education level: Not on file  Occupational History   Occupation: disability  Tobacco Use   Smoking status: Former    Packs/day: 0.00    Types: Cigarettes   Smokeless tobacco: Never   Tobacco comments:    Smoked for 2  years age [age]  Vaping Use   Vaping Use: Never used  Substance and Sexual Activity   Alcohol use: No    Alcohol/week: 1.0 standard drink of alcohol    Types: 1 Standard drinks or equivalent per week    Comment: denies at this time   Drug use: No    Comment: History of cocaine use at age 27 for 4 months   Sexual activity: Not Currently    Birth control/protection: None  Other Topics Concern   Not on file  Social History Narrative   Marital status: Widowed      Children: daughter      Lives: with boyfriend, in two story home      Employment:  Disability      Tobacco: quit smoking; smoked for two years.       Alcohol ;none      Drugs: none   Has not traveled outside of the country.   Right handed         Social Determinants of Health   Financial Resource Strain: Not on file  Food Insecurity: No Food Insecurity (01/01/2022)   Hunger Vital Sign    Worried About Running Out of Food in the Last Year: Never true    Ran Out of Food in the Last Year: Never true  Recent Concern: Food Insecurity - Food Insecurity Present (12/28/2021)   Hunger Vital Sign    Worried About Running Out of Food in the Last Year: Often true    Ran Out  of Food in the Last Year: Often true  Transportation Needs: Unmet Transportation Needs (01/01/2022)   PRAPARE - Hydrologist (Medical): No    Lack of Transportation (Non-Medical): Yes  Physical Activity: Not on file  Stress: Not on file  Social Connections: Not on file   Additional Social History:   Sleep: Poor  Appetite:  Good  Current Medications: Current Facility-Administered Medications  Medication Dose Route Frequency Provider Last Rate Last Admin   albuterol (VENTOLIN HFA) 108 (90 Base) MCG/ACT inhaler 1-2 puff  1-2 puff Inhalation Q6H PRN Massengill, Ovid Curd, MD       Asenapine PT24 3.8 mg  3.8 mg Transdermal Daily Massengill, Nathan, MD   3.8 mg at 01/13/22 0814   fluticasone furoate-vilanterol (BREO ELLIPTA) 200-25 MCG/ACT 1 puff  1 puff Inhalation Daily Massengill, Ovid Curd, MD   1 puff at 01/13/22 4888   hydrOXYzine (ATARAX) tablet 25 mg  25 mg Oral TID PRN Janine Limbo, MD   25 mg at 01/13/22 1232   ibuprofen (ADVIL) tablet 200 mg  200 mg Oral Q6H PRN Nicholes Rough, NP   200 mg at 01/12/22 1159   melatonin tablet 3 mg  3 mg Oral QHS Massengill, Nathan, MD   3 mg at 01/12/22 2024   metoprolol succinate (TOPROL-XL) 24 hr tablet 12.5 mg  12.5 mg Oral Daily Massengill, Ovid Curd, MD   12.5 mg at 01/13/22 9169   neomycin-bacitracin-polymyxin (NEOSPORIN) ointment   Topical PRN Onuoha, Chinwendu V, NP   Given at 01/12/22 2155    OLANZapine (ZYPREXA) injection 10 mg  10 mg Intramuscular TID PRN Massengill, Ovid Curd, MD       OLANZapine (ZYPREXA) tablet 10 mg  10 mg Oral TID PRN Janine Limbo, MD   10 mg at 01/13/22 1232   ondansetron (ZOFRAN) tablet 4 mg  4 mg Oral Q8H PRN Massengill, Ovid Curd, MD   4 mg at 01/13/22 0807   penicillin v potassium (VEETID) tablet 500 mg  500 mg Oral Q6H Massengill, Nathan, MD   500 mg at 01/13/22 1231   traZODone (DESYREL) tablet 25 mg  25 mg Oral QHS PRN Janine Limbo, MD   25 mg at 01/12/22 2158    Lab Results:  No results found for this or any previous visit (from the past 46 hour(s)).   Blood Alcohol level:  Lab Results  Component Value Date   ETH <10 10/29/2021   ETH <10 45/04/8880    Metabolic Disorder Labs: Lab Results  Component Value Date   HGBA1C 5.0 10/07/2021   MPG 96.8 10/07/2021   MPG 96.8 09/02/2021   Lab Results  Component Value Date   PROLACTIN 66.2 (H) 03/02/2021   PROLACTIN 66.1 (H) 02/04/2020   Lab Results  Component Value Date   CHOL 175 12/10/2021   TRIG 145 12/10/2021   HDL 44 12/10/2021   CHOLHDL 4.0 12/10/2021   VLDL 29 12/10/2021   LDLCALC 102 (H) 12/10/2021   LDLCALC 74 10/07/2021   Physical Findings: AIMS: 0 CIWA: n/a    COWS: n/a   Musculoskeletal: Strength & Muscle Tone: within normal limits Gait & Station: normal Patient leans: N/A  Psychiatric Specialty Exam:  Presentation  General Appearance:  Appropriate for Environment  Eye Contact: Fair  Speech: Other (comment) (rapid)  Speech Volume: Decreased  Handedness: Right  Mood and Affect  Mood: Depressed; Irritable  Affect: Congruent  Thought Process  Thought Processes: Coherent  Descriptions of Associations:Intact  Orientation:Full (Time, Place and Person)  Thought  Content:Illogical  History of Schizophrenia/Schizoaffective disorder:Yes  Duration of Psychotic Symptoms:Greater than six months  Hallucinations:Hallucinations:  None Description of Auditory Hallucinations: demeaning voices telling her that she is worthless and not loved  Ideas of Reference:Paranoia; Delusions; Percusatory  Suicidal Thoughts:Suicidal Thoughts: No  Homicidal Thoughts:Homicidal Thoughts: No  Sensorium  Memory: Immediate Good Judgment: Poor Insight: Poor  Executive Functions  Concentration: Poor  Attention Span: Fair  Recall: [employer] of Knowledge: Fair  Language: Fair  Psychomotor Activity  Psychomotor Activity: Psychomotor Activity: Normal  Assets  Assets: Resilience  Sleep  Sleep: Sleep: Good  Physical Exam: Physical Exam Constitutional:      Appearance: Normal appearance.  HENT:     Head: Normocephalic.     Nose: Nose normal. No congestion or rhinorrhea.  Eyes:     Pupils: Pupils are equal, round, and reactive to light.  Musculoskeletal:        General: Normal range of motion.     Cervical back: Normal range of motion.  Neurological:     General: No focal deficit present.     Mental Status: She is alert.    Review of Systems  Constitutional:  Negative for fever.  HENT:  Negative for hearing loss and sore throat.   Eyes: Negative.  Negative for blurred vision.  Respiratory:  Negative for cough.   Cardiovascular:  Negative for chest pain.  Gastrointestinal:  Negative for heartburn.  Genitourinary:  Negative for dysuria.  Musculoskeletal:  Negative for myalgias.  Skin:  Negative for rash.  Neurological:  Negative for dizziness.  Psychiatric/Behavioral:  Positive for depression and hallucinations. Negative for memory loss, substance abuse and suicidal ideas. The patient is nervous/anxious and has insomnia.    Blood pressure 114/74, pulse 98, temperature (!) 97.5 F (36.4 C), temperature source Oral, SpO2 99 %. There is no height or weight on file to calculate BMI.  Treatment Plan Summary: Daily contact with patient to assess and evaluate symptoms and progress in treatment and  Medication management   Observation Level/Precautions:  15 minute checks  Laboratory:  Labs reviewed   Psychotherapy:  Unit Group sessions  Medications:  See [facility]  Consultations:  To be determined   Discharge Concerns:  Safety, medication compliance, mood stability  Estimated LOS: 5-7 days  Other:  N/A    Labs Reviewed on 01/13/2022: CMP & CBC reviewed. Beta hcg negative, Tox screen negative, UA with small leukocytes and rare bacteria, pt asymptomatic for a UTI. TSH WNL, Lipid panel WNL, HA1C from 10/07/21 WNL. QTC from 11/27 with QTC slightly prolonged at 456. Repeat EKG ordered and Qtc even more prolonged at 477. Pt is asymptomatic.     PLAN Safety and Monitoring: Involuntary admission to inpatient psychiatric unit for safety, stabilization and treatment Daily contact with patient to assess and evaluate symptoms and progress in treatment Patient's case to be discussed in multi-disciplinary team meeting Observation Level : q15 minute checks Vital signs: q12 hours Precautions: Safety No Roommate d/t psychosis and aggressive behaviors   Long Term Goal(s): Improvement in symptoms so as ready for discharge   Short Term Goals: Ability to identify changes in lifestyle to reduce recurrence of condition will improve, Ability to verbalize feelings will improve, Ability to disclose and discuss suicidal ideas, Ability to demonstrate self-control will improve, Ability to identify and develop effective coping behaviors will improve, and Compliance with prescribed medications will improve   Physician Treatment Plan for Secondary Diagnosis:  Principal Problem:   Schizoaffective disorder, bipolar type ([place]) Active Problems:  Borderline personality disorder ([place])   PTSD (post-traumatic stress disorder)   Asthma   Tachycardia   GAD (generalized anxiety disorder)   Self-injurious behavior   Fibromyalgia   Medications -Continue Saphris patch 3.8 mg for psychosis & mood-Concerns regarding  management of current symptoms with patch. Attending psychiatrist to talk to pt and re educate on the [place] LAI and determine if she will be agreeable to this option, as she has been resistant to this prior to the patch (patch is a safe choice for pt currently d/t h/o multiple overdoses on PO medications. QTC with QTC-456, repeat EKG with Qtc 477. Will repeat EKG tomorrow, pt asymptomatic) -Zyprexa Discontinued. -Continue Protonix 40 mg daily for GERD -Continue Melatonin 3 mg nightly for insomnia -Continue Hydroxyzine 25 mg TID PRN for anxiety -Continue Trazodone to 25 mg nightly PRN for sleep -Continue Toprol XL 12.5 mg daily for tachycardia on 01/04/22 -Continue Breo Ellipta inhaler daily for asthma -Continue Albuterol PRN for SOB/Wheezing -Continue  Zyprexa 10 mg IM/PO PRN TID for agitation -Continue Penicillin V Potassium 500 mg BID for dental infection x 7 days-ends 12/09 -Discontinued Seroquel 25 mg BID on 11/29 -Discontinued Inderal 5 mg Q 12 H for tachycardia on 11/29.   Other PRNS -Continue Tylenol 650 mg every 6 hours PRN for mild pain -Continue Maalox 30 mg every 4 hrs PRN for indigestion -Continue Milk of Magnesia as needed every 6 hrs for constipation   Discharge Planning: Social work and case management to assist with discharge planning and identification of hospital follow-up needs prior to discharge Estimated LOS: 5-7 days Discharge Concerns: Need to establish a safety plan; Medication compliance and effectiveness Discharge Goals: Return home with outpatient referrals for mental health follow-up including medication management/psychotherapy  Nicholes Rough, NP 01/13/2022, 1:45 PMPatient ID: [name], female   DOB: 09-Jan-1991, 31 y.o.   MRN: [id number] Patient ID: [name], female   DOB: 06-04-1990, 31 y.o.   MRN: [id number]

## 2022-01-13 NOTE — Progress Notes (Signed)
Pt is A&OX4, agitated, anxious, malingering, denies suicidal ideations, denies homicidal ideations, denies auditory hallucinations and denies visual hallucinations. Pt verbally agrees to approach staff if these become apparent and before harming self or others. Pt denies experiencing nightmares. Mood and affect are congruent. Pt appetite is ok. No complaints of anxiety, distress, pain and/or discomfort at this time. Pt's memory appears to be grossly intact, and Pt hasn't displayed any injurious behaviors. Pt is medication compliant. There's no evidence of suicidal intent. Psychomotor activity was WNL. No s/s of Parkinson, Dystonia, Akathisia and/or Tardive Dyskinesia noted.

## 2022-01-13 NOTE — [facility] Group Notes (Signed)
Adult Psychoeducational Group Note  Date:  01/13/2022 Time:  8:15 PM  Group Topic/Focus:  Coping With Mental Health Crisis:   The purpose of this group is to help patients identify strategies for coping with mental health crisis.  Group discusses possible causes of crisis and ways to manage them effectively.  Participation Level:  Active  Participation Quality:  Attentive  Affect:  Appropriate  Cognitive:  Alert  Insight: Appropriate  Engagement in Group:  Engaged  Modes of Intervention:  Discussion  Additional Comments  Dalene Carrow 01/13/2022, 8:15 PM

## 2022-01-13 NOTE — Progress Notes (Signed)
   01/13/22 2054  Psych Admission Type (Psych Patients Only)  Admission Status Voluntary  Psychosocial Assessment  Patient Complaints None  Eye Contact Avoids  Facial Expression Flat  Affect Appropriate to circumstance;Depressed  Speech Logical/coherent;Soft  Interaction Assertive  Motor Activity Slow  Appearance/Hygiene Improved  Behavior Characteristics Cooperative;Appropriate to situation  Mood Depressed  Aggressive Behavior  Effect No apparent injury  Thought Process  Coherency Circumstantial  Content Blaming others  Delusions None reported or observed  Perception WDL  Hallucination None reported or observed  Judgment Impaired  Confusion None  Danger to Self  Current suicidal ideation? Denies  Danger to Others  Danger to Others None reported or observed

## 2022-01-13 NOTE — Progress Notes (Addendum)
Pt c/o anxiety and agitation. Pt is stting in bed and moderately banging head on wall. Writer spoke with patient. Patient aggressively and tearfully stated, "I need one on one therapy. I'm not trying to say the people in group are stupid but they're down there and I'm up here. I need someone on my level. I just need them to let me go home so I can get one on one therapy."

## 2022-01-13 NOTE — Group Note (Signed)
[place] Group Notes: (Clinical Social Work)   01/13/2022      Type of Therapy:  Group Therapy   Participation Level:  Did Not Attend - was invited both individually by MHT and by overhead announcement, chose not to attend.   Selmer Dominion, LCSW 01/13/2022, 2:05 PM

## 2022-01-14 DIAGNOSIS — F250 Schizoaffective disorder, bipolar type: Secondary | ICD-10-CM | POA: Diagnosis not present

## 2022-01-14 NOTE — Group Note (Signed)
[place] LCSW Group Therapy Note  Date/Time:    01/14/2022   Type of Therapy and Topic:  Group Therapy:  Shame and its Impact on My Life  Participation Level:  Did Not Attend   Description of Group:  The focus of this group was to examine our tendency to be hyper-critical of self and how this leads to feelings of worthlessness, hopelessness, and shame.  Patients were guided to the concept that shame is universal and is worsened by being kept hidden, but improved by being revealed.  We discussed how feeling unworthy is the result of shame and discussed the differences between guilt  ("I did something bad" "I made a mistake" "I did something stupid") and shame ("I am bad" "I am a mistake" "I am stupid") .  We discussed that feelings are not necessarily based in facts.  We also talked about what happens when we do something to numb our negative feelings and how that actually numbs our positive feelings at the same time.  Therapeutic Goals Identify statements patients automatically say to themselves, "I'll be worthy when...."  Examine how this is unkind to ourselves because it indicates we cannot be worthy until some far-reaching, possibly even unreachable, goal is achieved. Talk about the frequent use of unhealthy coping skills used when feeling unworthy Practice 4 coping skills briefly (5 senses mindfulness, Camera Zoom In and Out, Grounding, 5-finger breathing) Allow patients to discuss their shame out loud in order to reduce its power over them  Summary of Patient Progress: Patient was invited to group, did not attend.   Therapeutic Modalities Processing  Selmer Dominion, LCSW 01/14/2022, 12:22 PM

## 2022-01-14 NOTE — Progress Notes (Signed)
   01/14/22 0900  Psych Admission Type (Psych Patients Only)  Admission Status Voluntary  Psychosocial Assessment  Patient Complaints Isolation;Depression  Eye Contact Avoids  Facial Expression Flat;Sad  Affect Appropriate to circumstance  Speech Logical/coherent  Interaction Assertive  Motor Activity Slow  Appearance/Hygiene Disheveled  Behavior Characteristics Cooperative;Guarded;Appropriate to situation  Mood Depressed;Labile  Aggressive Behavior  Effect No apparent injury  Thought Process  Coherency Circumstantial  Content Blaming others  Delusions None reported or observed  Perception WDL  Hallucination None reported or observed  Judgment Impaired  Confusion None  Danger to Self  Current suicidal ideation? Denies  Danger to Others  Danger to Others None reported or observed  Danger to Others Abnormal  Harmful Behavior to others No threats or harm toward other people  Destructive Behavior No threats or harm toward property

## 2022-01-14 NOTE — [facility] Group Notes (Signed)
Pt attended wrap-up group. Pt was happy about talking to friends.

## 2022-01-14 NOTE — Progress Notes (Signed)
   01/14/22 2049  Psych Admission Type (Psych Patients Only)  Admission Status Voluntary  Psychosocial Assessment  Eye Contact Avoids  Facial Expression Flat  Affect Appropriate to circumstance;Depressed  Speech Logical/coherent;Soft  Interaction Assertive  Motor Activity Slow  Appearance/Hygiene Improved  Behavior Characteristics Appropriate to situation  Mood Depressed;Labile  Aggressive Behavior  Effect No apparent injury  Thought Process  Coherency Circumstantial  Content Blaming others  Delusions None reported or observed  Perception WDL  Hallucination None reported or observed  Judgment Impaired  Confusion None  Danger to Self  Current suicidal ideation? Denies  Danger to Others  Danger to Others None reported or observed

## 2022-01-14 NOTE — Progress Notes (Signed)
[facility] MD Progress Note  01/14/2022 2:14 PM [name]  MRN:  [id number] Principal Problem: Schizoaffective disorder, bipolar type ([place]) Diagnosis: Principal Problem:   Schizoaffective disorder, bipolar type ([place]) Active Problems:   Borderline personality disorder ([place])   PTSD (post-traumatic stress disorder)   Asthma   Anxiety state   Self-injurious behavior   Insomnia   Pain in joint, ankle and foot  Reason For Admission: [name] is a 31 yo Caucasian female with prior mental health health diagnoses of borderline personality d/o & Schizoaffective d/o who was taken to the [facility] by [facility] after she called the crises hotline number to report that she had overdosed on multiple medications in an attempt to end her life. As per ED assessment, "she took 6 '40mg'$  Zofran pills, 6 '25mg'$  Atenolol pills, 6 '10mg'$  Cyclobenzapr pills, 18 '5mg'$  escitalopram pills, 18 '10mg'$  Hydroxyz pills, 6 '40mg'$  pantroprazole pills, 6 '1mg'$  Prazosin hcl pills, 18 '50mg'$  pregabalin pills, 3 '25mg'$  Quetiapine pills, and 6 1.'5mg'$  vraylar pills."  Patient was involuntarily committed and transferred to this behavioral health Hospital for treatment and stabilization of her mood.   24 hrs chart review: V/S for the past 24 hrs WNL. Overnight, pt presented with anxiety, was circumstantial, and appeared disheveled as per nursing documentation. She required PRN agitation protocol medication (Zyprexa 10 mg) today afternoon for banging head on the wall, and demanding to leave hospital because she wants "one on one therapy". She was in attendance of last night's unit group session, but did not attend group today morning.  Patient assessment note, 01/14/22: Pt is continuing to present with irritability even though it is to a lower intensity than yesterday. She presents with paranoia and delusions of persecution, and states that her family is out to harm her. She states that she feels as though they do not love her, don't care about her, and  never come to see her. Empathy and active listening provided, pt educated that the only person she has the power to control is herself, and verbalizes understanding.   Pt reports a poor sleep quality last night due to nightmares, and reports a good appetite. She denies SI, denies HI, denies AVH. She denies being in any physical distress today. We are continuing all medications as listed below. Writer educated pt on LAI again, rationales, benefits of a LAI medication explained to pot but she continues to be resistant to the idea of starting an LAI.   Total Time spent with patient: 45 minutes  Past Psychiatric History: Schizoaffective d/o borderline personality d/o  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 fragile X syndrome    Chronic constipation    Depression    Drug-seeking behavior    Essential tremor    Headache    Overdose of acetaminophen 07/2017   and other meds   Personality disorder ([place])    Schizo-affective psychosis ([place])    Schizoaffective disorder, bipolar type ([place])    Seizures ([place])    Last seizure December 2017   Sleep apnea     Past Surgical History:  Procedure Laterality Date   MOUTH SURGERY  2009 or 2010   Family History:  Family History  Problem Relation Age of Onset   Mental illness Father    Asthma Father    PDD Brother    Seizures Brother  Family Psychiatric  History: As above Social History:  Social History   Substance and Sexual Activity  Alcohol Use No   Alcohol/week: 1.0 standard drink of alcohol   Types: 1 Standard drinks or equivalent per week   Comment: denies at this time     Social History   Substance and Sexual Activity  Drug Use No   Comment: History of cocaine use at age 58 for 4 months    Social History   Socioeconomic History   Marital status: Widowed    Spouse name: Not on file   Number of children: 0    Years of education: Not on file   Highest education level: Not on file  Occupational History   Occupation: disability  Tobacco Use   Smoking status: Former    Packs/day: 0.00    Types: Cigarettes   Smokeless tobacco: Never   Tobacco comments:    Smoked for 2  years age [age]  Vaping Use   Vaping Use: Never used  Substance and Sexual Activity   Alcohol use: No    Alcohol/week: 1.0 standard drink of alcohol    Types: 1 Standard drinks or equivalent per week    Comment: denies at this time   Drug use: No    Comment: History of cocaine use at age 18 for 4 months   Sexual activity: Not Currently    Birth control/protection: None  Other Topics Concern   Not on file  Social History Narrative   Marital status: Widowed      Children: daughter      Lives: with boyfriend, in two story home      Employment:  Disability      Tobacco: quit smoking; smoked for two years.      Alcohol ;none      Drugs: none   Has not traveled outside of the country.   Right handed         Social Determinants of Health   Financial Resource Strain: Not on file  Food Insecurity: No Food Insecurity (01/01/2022)   Hunger Vital Sign    Worried About Running Out of Food in the Last Year: Never true    Ran Out of Food in the Last Year: Never true  Recent Concern: Food Insecurity - Food Insecurity Present (12/28/2021)   Hunger Vital Sign    Worried About Running Out of Food in the Last Year: Often true    Ran Out of Food in the Last Year: Often true  Transportation Needs: Unmet Transportation Needs (01/01/2022)   PRAPARE - Hydrologist (Medical): No    Lack of Transportation (Non-Medical): Yes  Physical Activity: Not on file  Stress: Not on file  Social Connections: Not on file   Additional Social History:   Sleep: Poor  Appetite:  Good  Current Medications: Current Facility-Administered Medications  Medication Dose Route Frequency Provider Last Rate Last Admin    albuterol (VENTOLIN HFA) 108 (90 Base) MCG/ACT inhaler 1-2 puff  1-2 puff Inhalation Q6H PRN Massengill, Ovid Curd, MD       Asenapine PT24 3.8 mg  3.8 mg Transdermal Daily Massengill, Nathan, MD   3.8 mg at 01/14/22 0745   fluticasone furoate-vilanterol (BREO ELLIPTA) 200-25 MCG/ACT 1 puff  1 puff Inhalation Daily Massengill, Nathan, MD   1 puff at 01/14/22 0747   hydrOXYzine (ATARAX) tablet 25 mg  25 mg Oral TID PRN Janine Limbo, MD   25 mg at 01/14/22 0745  ibuprofen (ADVIL) tablet 200 mg  200 mg Oral Q6H PRN Nicholes Rough, NP   200 mg at 01/14/22 0746   melatonin tablet 3 mg  3 mg Oral QHS Massengill, Nathan, MD   3 mg at 01/13/22 2028   metoprolol succinate (TOPROL-XL) 24 hr tablet 12.5 mg  12.5 mg Oral Daily Massengill, Ovid Curd, MD   12.5 mg at 01/14/22 0745   neomycin-bacitracin-polymyxin (NEOSPORIN) ointment   Topical PRN Onuoha, Chinwendu V, NP   Given at 01/12/22 2155   OLANZapine (ZYPREXA) injection 10 mg  10 mg Intramuscular TID PRN Massengill, Ovid Curd, MD       OLANZapine (ZYPREXA) tablet 10 mg  10 mg Oral TID PRN Janine Limbo, MD   10 mg at 01/13/22 1232   ondansetron (ZOFRAN) tablet 4 mg  4 mg Oral Q8H PRN Massengill, Ovid Curd, MD   4 mg at 01/13/22 3220   traZODone (DESYREL) tablet 25 mg  25 mg Oral QHS PRN Janine Limbo, MD   25 mg at 01/13/22 2028    Lab Results:  No results found for this or any previous visit (from the past 48 hour(s)).   Blood Alcohol level:  Lab Results  Component Value Date   ETH <10 10/29/2021   ETH <10 25/42/7062    Metabolic Disorder Labs: Lab Results  Component Value Date   HGBA1C 5.0 10/07/2021   MPG 96.8 10/07/2021   MPG 96.8 09/02/2021   Lab Results  Component Value Date   PROLACTIN 66.2 (H) 03/02/2021   PROLACTIN 66.1 (H) 02/04/2020   Lab Results  Component Value Date   CHOL 175 12/10/2021   TRIG 145 12/10/2021   HDL 44 12/10/2021   CHOLHDL 4.0 12/10/2021   VLDL 29 12/10/2021   LDLCALC 102 (H) 12/10/2021    LDLCALC 74 10/07/2021   Physical Findings: AIMS: 0 CIWA: n/a    COWS: n/a   Musculoskeletal: Strength & Muscle Tone: within normal limits Gait & Station: normal Patient leans: N/A  Psychiatric Specialty Exam:  Presentation  General Appearance:  Fairly Groomed  Eye Contact: Fair  Speech: Clear and Coherent  Speech Volume: Decreased  Handedness: Right  Mood and Affect  Mood: Depressed  Affect: Congruent  Thought Process  Thought Processes: Coherent  Descriptions of Associations:Intact  Orientation:Full (Time, Place and Person)  Thought Content:Illogical  History of Schizophrenia/Schizoaffective disorder:Yes  Duration of Psychotic Symptoms:Greater than six months  Hallucinations:Hallucinations: None  Ideas of Reference:Paranoia  Suicidal Thoughts:Suicidal Thoughts: No  Homicidal Thoughts:Homicidal Thoughts: No  Sensorium  Memory: Immediate Good Judgment: Poor Insight: Poor  Executive Functions  Concentration: Poor  Attention Span: Poor  Recall: [employer] of Knowledge: Fair  Language: Fair  Psychomotor Activity  Psychomotor Activity: Psychomotor Activity: Normal  Assets  Assets: Housing; Resilience  Sleep  Sleep: Sleep: Poor  Physical Exam: Physical Exam Constitutional:      Appearance: Normal appearance.  HENT:     Head: Normocephalic.     Nose: Nose normal. No congestion or rhinorrhea.  Eyes:     Pupils: Pupils are equal, round, and reactive to light.  Musculoskeletal:        General: Normal range of motion.     Cervical back: Normal range of motion.  Neurological:     General: No focal deficit present.     Mental Status: She is alert.    Review of Systems  Constitutional:  Negative for fever.  HENT:  Negative for hearing loss and sore throat.   Eyes: Negative.  Negative for  blurred vision.  Respiratory:  Negative for cough.   Cardiovascular:  Negative for chest pain.  Gastrointestinal:  Negative for  heartburn.  Genitourinary:  Negative for dysuria.  Musculoskeletal:  Negative for myalgias.  Skin:  Negative for rash.  Neurological:  Negative for dizziness.  Psychiatric/Behavioral:  Positive for depression and hallucinations. Negative for memory loss, substance abuse and suicidal ideas. The patient is nervous/anxious and has insomnia.    Blood pressure 127/72, pulse 98, temperature 97.8 F (36.6 C), temperature source Oral, SpO2 100 %. There is no height or weight on file to calculate BMI.  Treatment Plan Summary: Daily contact with patient to assess and evaluate symptoms and progress in treatment and Medication management   Observation Level/Precautions:  15 minute checks  Laboratory:  Labs reviewed   Psychotherapy:  Unit Group sessions  Medications:  See [facility]  Consultations:  To be determined   Discharge Concerns:  Safety, medication compliance, mood stability  Estimated LOS: 5-7 days  Other:  N/A    Labs Reviewed on 01/13/2022: CMP & CBC reviewed. Beta hcg negative, Tox screen negative, UA with small leukocytes and rare bacteria, pt asymptomatic for a UTI. TSH WNL, Lipid panel WNL, HA1C from 10/07/21 WNL. QTC from 11/27 with QTC slightly prolonged at 456. Repeat EKG ordered and Qtc even more prolonged at 477. Pt is asymptomatic.     PLAN Safety and Monitoring: Involuntary admission to inpatient psychiatric unit for safety, stabilization and treatment Daily contact with patient to assess and evaluate symptoms and progress in treatment Patient's case to be discussed in multi-disciplinary team meeting Observation Level : q15 minute checks Vital signs: q12 hours Precautions: Safety No Roommate d/t psychosis and aggressive behaviors   Long Term Goal(s): Improvement in symptoms so as ready for discharge   Short Term Goals: Ability to identify changes in lifestyle to reduce recurrence of condition will improve, Ability to verbalize feelings will improve, Ability to disclose and  discuss suicidal ideas, Ability to demonstrate self-control will improve, Ability to identify and develop effective coping behaviors will improve, and Compliance with prescribed medications will improve   Physician Treatment Plan for Secondary Diagnosis:  Principal Problem:   Schizoaffective disorder, bipolar type ([place]) Active Problems:   Borderline personality disorder ([facility])   PTSD (post-traumatic stress disorder)   Asthma   Tachycardia   GAD (generalized anxiety disorder)   Self-injurious behavior   Fibromyalgia   Medications -Continue Saphris patch 3.8 mg for psychosis & mood-Concerns regarding management of current symptoms with patch. Attending psychiatrist to talk to pt and re educate on the [place] LAI and determine if she will be agreeable to this option, as she has been resistant to this prior to the patch (patch is a safe choice for pt currently d/t h/o multiple overdoses on PO medications. QTC with QTC-456, repeat EKG with Qtc 477. Will repeat EKG tomorrow, pt asymptomatic) -Zyprexa Discontinued. -Continue Protonix 40 mg daily for GERD -Continue Melatonin 3 mg nightly for insomnia -Continue Hydroxyzine 25 mg TID PRN for anxiety -Continue Trazodone to 25 mg nightly PRN for sleep -Continue Toprol XL 12.5 mg daily for tachycardia on 01/04/22 -Continue Breo Ellipta inhaler daily for asthma -Continue Albuterol PRN for SOB/Wheezing -Continue  Zyprexa 10 mg IM/PO PRN TID for agitation -Continue Penicillin V Potassium 500 mg BID for dental infection x 7 days-ends 12/09 -Discontinued Seroquel 25 mg BID on 11/29 -Discontinued Inderal 5 mg Q 12 H for tachycardia on 11/29.   Other PRNS -Continue Tylenol 650 mg every 6 hours  PRN for mild pain -Continue Maalox 30 mg every 4 hrs PRN for indigestion -Continue Milk of Magnesia as needed every 6 hrs for constipation   Discharge Planning: Social work and case management to assist with discharge planning and identification of hospital  follow-up needs prior to discharge Estimated LOS: 5-7 days Discharge Concerns: Need to establish a safety plan; Medication compliance and effectiveness Discharge Goals: Return home with outpatient referrals for mental health follow-up including medication management/psychotherapy  Nicholes Rough, NP 01/14/2022, 2:14 PMPatient ID: [name], female   DOB: 14-Nov-1990, 31 y.o.   MRN: [id number] Patient ID: [name], female   DOB: [birth year], 31 y.o.   MRN: [id number] Patient ID: [name], female   DOB: 08/31/1990, 31 y.o.   MRN: [id number]

## 2022-01-15 ENCOUNTER — Encounter (HOSPITAL_COMMUNITY): Payer: Self-pay

## 2022-01-15 DIAGNOSIS — F251 Schizoaffective disorder, depressive type: Secondary | ICD-10-CM | POA: Diagnosis not present

## 2022-01-15 NOTE — Group Note (Signed)
Recreation Therapy Group Note   Group Topic:Goal Setting  Group Date: 01/15/2022 Start Time: 1000 End Time: 1025 Facilitators: Collin Hendley-McCall, LRT,CTRS Location: [address] Dayroom   Goal Area(s) Addresses:  Patient will identify the importance of setting life goals. Patient will identify how to improve in specific areas. Patient will express the importance of setting goals post d/c.  Group Description: Setting Life Goals.  Patients were given a worksheet that focused on patients setting goals in 6 different categories (family, friends, work/school, spirituality, body and mental health).  Patients were to identify what needed to be improved and set a goal of how to accomplish that goal.  LRT encouraged patients to be as specific as possible when coming up with their goals.  Patients would then share their top 4 categories they want to work on and share with group.      Affect/Mood: Appropriate   Participation Level: Engaged   Participation Quality: Independent   Behavior: Appropriate   Speech/Thought Process: Focused   Insight: Moderate   Judgement: Moderate   Modes of Intervention: Worksheet   Patient Response to Interventions:  Engaged   Education Outcome:  Acknowledges education and In group clarification offered    Clinical Observations/Individualized Feedback: Pt was able to focus enough to complete the activity.  Pt was attentive and engaged.  Pt identified her top four categories to work on as work/school, body, spirituality and mental health.  Pt expressed wanting to get a job and set a goal of getting future job.  For body, pt wants to work on "hurting myself" and set a goal of making goals.  Pt went on to express wanting to trust God and set a goal of reading the bible more.  Lastly, pt stated wanting to improve mental health by telling the truth and set a goal of telling her true feelings.     Plan: Continue to engage patient in RT group sessions  2-3x/week.   Christelle Igoe-McCall, LRT,CTRS  01/15/2022 11:56 AM

## 2022-01-15 NOTE — Progress Notes (Signed)
[facility] MD Progress Note  01/15/2022 3:58 PM Nattie Lazenby  MRN:  [id number] Principal Problem: Schizoaffective disorder, bipolar type ([place]) Diagnosis: Principal Problem:   Schizoaffective disorder, bipolar type ([place]) Active Problems:   Borderline personality disorder ([place])   PTSD (post-traumatic stress disorder)   Asthma   Anxiety state   Self-injurious behavior   Insomnia   Pain in joint, ankle and foot  Reason For Admission: [name] is a 31 yo Caucasian female with prior mental health health diagnoses of borderline personality d/o & Schizoaffective d/o who was taken to the [facility] by [facility] after she called the crises hotline number to report that she had overdosed on multiple medications in an attempt to end her life. As per ED assessment, "she took 6 '40mg'$  Zofran pills, 6 '25mg'$  Atenolol pills, 6 '10mg'$  Cyclobenzapr pills, 18 '5mg'$  escitalopram pills, 18 '10mg'$  Hydroxyz pills, 6 '40mg'$  pantroprazole pills, 6 '1mg'$  Prazosin hcl pills, 18 '50mg'$  pregabalin pills, 3 '25mg'$  Quetiapine pills, and 6 1.'5mg'$  vraylar pills."  Patient was involuntarily committed and transferred to this behavioral health Hospital for treatment and stabilization of her mood.   24 hrs chart review: V/S for the past 24 hrs with BP elevated slightly at 140/92. HR slightly elevated at 102. Pt is compliant with scheduled medications, and has not required an agitation protocol medication in the past 24 hrs. Pt has been cooperative with care for the past at least 48 hrs, and has attended some unit group activities in which he actively participated.   Patient assessment note, 01/15/22: Today, pt is calm and cooperative during this encounter. She presents with a euthymic mood, and affect is congruent. She is future oriented, and talks about having a fiance in the [employer] who she is looking forward to seeing when he comes home for Christmas. She talks about what she will do when she goes home tomorrow such as clean her apartment, go buy  groceries and christmas presents amongst other things. She is logical in her presentation of ideas, speech is clear and less rapid, and she denies SI/HI/AVH, denies feelings of paranoia and also denies other delusional thinking.   She reports that her sleep quality last night was good, she reports a good appetite, and denies being in any physical distress. She denies any current medication related side effects. Pt verbalizes readiness for discharge and verbally contracts for safety outside of this hospital. We are continuing medications as listed below, and plan is to discharge pt tomorrow.  Total Time spent with patient: 45 minutes  Past Psychiatric History: Schizoaffective d/o borderline personality d/o  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 fragile X syndrome    Chronic constipation    Depression    Drug-seeking behavior    Essential tremor    Headache    Overdose of acetaminophen 07/2017   and other meds   Personality disorder ([place])    Schizo-affective psychosis ([place])    Schizoaffective disorder, bipolar type ([place])    Seizures ([place])    Last seizure December 2017   Sleep apnea     Past Surgical History:  Procedure Laterality Date   MOUTH SURGERY  2009 or 2010   Family History:  Family History  Problem Relation Age of Onset   Mental illness Father    Asthma Father    PDD Brother  Seizures Brother    Family Psychiatric  History: As above Social History:  Social History   Substance and Sexual Activity  Alcohol Use No   Alcohol/week: 1.0 standard drink of alcohol   Types: 1 Standard drinks or equivalent per week   Comment: denies at this time     Social History   Substance and Sexual Activity  Drug Use No   Comment: History of cocaine use at age 64 for 4 months    Social History   Socioeconomic History   Marital status: Widowed    Spouse  name: Not on file   Number of children: 0   Years of education: Not on file   Highest education level: Not on file  Occupational History   Occupation: disability  Tobacco Use   Smoking status: Former    Packs/day: 0.00    Types: Cigarettes   Smokeless tobacco: Never   Tobacco comments:    Smoked for 2  years age [age]  Vaping Use   Vaping Use: Never used  Substance and Sexual Activity   Alcohol use: No    Alcohol/week: 1.0 standard drink of alcohol    Types: 1 Standard drinks or equivalent per week    Comment: denies at this time   Drug use: No    Comment: History of cocaine use at age 38 for 4 months   Sexual activity: Not Currently    Birth control/protection: None  Other Topics Concern   Not on file  Social History Narrative   Marital status: Widowed      Children: daughter      Lives: with boyfriend, in two story home      Employment:  Disability      Tobacco: quit smoking; smoked for two years.      Alcohol ;none      Drugs: none   Has not traveled outside of the country.   Right handed         Social Determinants of Health   Financial Resource Strain: Not on file  Food Insecurity: No Food Insecurity (01/01/2022)   Hunger Vital Sign    Worried About Running Out of Food in the Last Year: Never true    Ran Out of Food in the Last Year: Never true  Recent Concern: Food Insecurity - Food Insecurity Present (12/28/2021)   Hunger Vital Sign    Worried About Running Out of Food in the Last Year: Often true    Ran Out of Food in the Last Year: Often true  Transportation Needs: Unmet Transportation Needs (01/01/2022)   PRAPARE - Hydrologist (Medical): No    Lack of Transportation (Non-Medical): Yes  Physical Activity: Not on file  Stress: Not on file  Social Connections: Not on file   Additional Social History:   Sleep: Poor  Appetite:  Good  Current Medications: Current Facility-Administered Medications  Medication Dose Route  Frequency Provider Last Rate Last Admin   albuterol (VENTOLIN HFA) 108 (90 Base) MCG/ACT inhaler 1-2 puff  1-2 puff Inhalation Q6H PRN Massengill, Ovid Curd, MD       Asenapine PT24 3.8 mg  3.8 mg Transdermal Daily Massengill, Nathan, MD   3.8 mg at 01/15/22 0755   fluticasone furoate-vilanterol (BREO ELLIPTA) 200-25 MCG/ACT 1 puff  1 puff Inhalation Daily Massengill, Ovid Curd, MD   1 puff at 01/15/22 0759   hydrOXYzine (ATARAX) tablet 25 mg  25 mg Oral TID PRN Janine Limbo, MD   25 mg  at 01/14/22 0745   ibuprofen (ADVIL) tablet 200 mg  200 mg Oral Q6H PRN Nicholes Rough, NP   200 mg at 01/14/22 1929   melatonin tablet 3 mg  3 mg Oral QHS Massengill, Nathan, MD   3 mg at 01/14/22 2021   metoprolol succinate (TOPROL-XL) 24 hr tablet 12.5 mg  12.5 mg Oral Daily Massengill, Ovid Curd, MD   12.5 mg at 01/15/22 9767   neomycin-bacitracin-polymyxin (NEOSPORIN) ointment   Topical PRN Onuoha, Chinwendu V, NP   Given at 01/12/22 2155   OLANZapine (ZYPREXA) injection 10 mg  10 mg Intramuscular TID PRN Massengill, Ovid Curd, MD       OLANZapine (ZYPREXA) tablet 10 mg  10 mg Oral TID PRN Janine Limbo, MD   10 mg at 01/13/22 1232   ondansetron (ZOFRAN) tablet 4 mg  4 mg Oral Q8H PRN Massengill, Ovid Curd, MD   4 mg at 01/15/22 1329   traZODone (DESYREL) tablet 25 mg  25 mg Oral QHS PRN Janine Limbo, MD   25 mg at 01/14/22 2021    Lab Results:  No results found for this or any previous visit (from the past 85 hour(s)).   Blood Alcohol level:  Lab Results  Component Value Date   ETH <10 10/29/2021   ETH <10 34/19/3790    Metabolic Disorder Labs: Lab Results  Component Value Date   HGBA1C 5.0 10/07/2021   MPG 96.8 10/07/2021   MPG 96.8 09/02/2021   Lab Results  Component Value Date   PROLACTIN 66.2 (H) 03/02/2021   PROLACTIN 66.1 (H) 02/04/2020   Lab Results  Component Value Date   CHOL 175 12/10/2021   TRIG 145 12/10/2021   HDL 44 12/10/2021   CHOLHDL 4.0 12/10/2021   VLDL 29  12/10/2021   LDLCALC 102 (H) 12/10/2021   LDLCALC 74 10/07/2021   Physical Findings: AIMS: 0 CIWA: n/a    COWS: n/a   Musculoskeletal: Strength & Muscle Tone: within normal limits Gait & Station: normal Patient leans: N/A  Psychiatric Specialty Exam:  Presentation  General Appearance:  Appropriate for Environment  Eye Contact: Fair  Speech: Clear and Coherent  Speech Volume: Normal  Handedness: Right  Mood and Affect  Mood: Euthymic  Affect: Congruent  Thought Process  Thought Processes: Coherent  Descriptions of Associations:Intact  Orientation:Full (Time, Place and Person)  Thought Content:Logical  History of Schizophrenia/Schizoaffective disorder:Yes  Duration of Psychotic Symptoms:Greater than six months  Hallucinations:Hallucinations: None  Ideas of Reference:None  Suicidal Thoughts:Suicidal Thoughts: No  Homicidal Thoughts:Homicidal Thoughts: No  Sensorium  Memory: Immediate Good Judgment: Fair Insight: Fair  Executive Functions  Concentration: Good  Attention Span: Good  Recall: [place] of Knowledge: Fair  Language: Fair  Psychomotor Activity  Psychomotor Activity: Psychomotor Activity: Normal  Assets  Assets: Housing; Resilience  Sleep  Sleep: Sleep: Good  Physical Exam: Physical Exam Constitutional:      Appearance: Normal appearance.  HENT:     Head: Normocephalic.     Nose: Nose normal. No congestion or rhinorrhea.  Eyes:     Pupils: Pupils are equal, round, and reactive to light.  Musculoskeletal:        General: Normal range of motion.     Cervical back: Normal range of motion.  Neurological:     General: No focal deficit present.     Mental Status: She is alert.    Review of Systems  Constitutional:  Negative for fever.  HENT:  Negative for hearing loss and sore throat.  Eyes: Negative.  Negative for blurred vision.  Respiratory:  Negative for cough.   Cardiovascular:  Negative for  chest pain.  Gastrointestinal:  Negative for heartburn.  Genitourinary:  Negative for dysuria.  Musculoskeletal:  Negative for myalgias.  Skin:  Negative for rash.  Neurological:  Negative for dizziness.  Psychiatric/Behavioral:  Positive for depression and hallucinations. Negative for memory loss, substance abuse and suicidal ideas. The patient is nervous/anxious and has insomnia.    Blood pressure 110/64, pulse (!) 102, temperature 97.8 F (36.6 C), temperature source Oral, SpO2 100 %. There is no height or weight on file to calculate BMI.  Treatment Plan Summary: Daily contact with patient to assess and evaluate symptoms and progress in treatment and Medication management   Observation Level/Precautions:  15 minute checks  Laboratory:  Labs reviewed   Psychotherapy:  Unit Group sessions  Medications:  See [facility]  Consultations:  To be determined   Discharge Concerns:  Safety, medication compliance, mood stability  Estimated LOS: 5-7 days  Other:  N/A    Labs Reviewed on 01/13/2022: CMP & CBC reviewed. Beta hcg negative, Tox screen negative, UA with small leukocytes and rare bacteria, pt asymptomatic for a UTI. TSH WNL, Lipid panel WNL, HA1C from 10/07/21 WNL. QTC from 11/27 with QTC slightly prolonged at 456. Repeat EKG ordered and Qtc even more prolonged at 477. Pt is asymptomatic.     PLAN Safety and Monitoring: Involuntary admission to inpatient psychiatric unit for safety, stabilization and treatment Daily contact with patient to assess and evaluate symptoms and progress in treatment Patient's case to be discussed in multi-disciplinary team meeting Observation Level : q15 minute checks Vital signs: q12 hours Precautions: Safety No Roommate d/t psychosis and aggressive behaviors   Long Term Goal(s): Improvement in symptoms so as ready for discharge   Short Term Goals: Ability to identify changes in lifestyle to reduce recurrence of condition will improve, Ability to verbalize  feelings will improve, Ability to disclose and discuss suicidal ideas, Ability to demonstrate self-control will improve, Ability to identify and develop effective coping behaviors will improve, and Compliance with prescribed medications will improve   Physician Treatment Plan for Secondary Diagnosis:  Principal Problem:   Schizoaffective disorder, bipolar type ([place]) Active Problems:   Borderline personality disorder ([facility])   PTSD (post-traumatic stress disorder)   Asthma   Tachycardia   GAD (generalized anxiety disorder)   Self-injurious behavior   Fibromyalgia   Medications -Continue Saphris patch 3.8 mg for psychosis & mood-Concerns regarding management of current symptoms with patch. Attending psychiatrist to talk to pt and re educate on the [place] LAI and determine if she will be agreeable to this option, as she has been resistant to this prior to the patch (patch is a safe choice for pt currently d/t h/o multiple overdoses on PO medications. QTC with QTC-456, repeat EKG with Qtc 477. Will repeat EKG tomorrow, pt asymptomatic) -Zyprexa Discontinued. -Continue Protonix 40 mg daily for GERD -Continue Melatonin 3 mg nightly for insomnia -Continue Hydroxyzine 25 mg TID PRN for anxiety -Continue Trazodone to 25 mg nightly PRN for sleep -Continue Toprol XL 12.5 mg daily for tachycardia on 01/04/22 -Continue Breo Ellipta inhaler daily for asthma -Continue Albuterol PRN for SOB/Wheezing -Continue  Zyprexa 10 mg IM/PO PRN TID for agitation -Continue Penicillin V Potassium 500 mg BID for dental infection x 7 days-ends 12/09 -Discontinued Seroquel 25 mg BID on 11/29 -Discontinued Inderal 5 mg Q 12 H for tachycardia on 11/29.   Other PRNS -Continue  Tylenol 650 mg every 6 hours PRN for mild pain -Continue Maalox 30 mg every 4 hrs PRN for indigestion -Continue Milk of Magnesia as needed every 6 hrs for constipation   Discharge Planning: Social work and case management to assist with discharge  planning and identification of hospital follow-up needs prior to discharge Estimated LOS: 5-7 days Discharge Concerns: Need to establish a safety plan; Medication compliance and effectiveness Discharge Goals: Return home with outpatient referrals for mental health follow-up including medication management/psychotherapy  Nicholes Rough, NP 01/15/2022, 3:58 PMPatient ID: [name], female   DOB: 1990-11-02, 31 y.o.   MRN: [id number] Patient ID: Mailey Landstrom, female   DOB: 08/01/1990, 31 y.o.   MRN: [id number] Patient ID: Natia Fahmy, female   DOB: 02/27/1990, 31 y.o.   MRN: [id number] Patient ID: Franki Alcaide, female   DOB: 17-Sep-1990, 31 y.o.   MRN: [id number]

## 2022-01-15 NOTE — BHH Group Notes (Signed)
[facility] Group Notes:  (Nursing/MHT/Case Management/Adjunct)  Date:  01/15/2022  Time:  9:31 AM  Type of Therapy:  Psychoeducational Skills  Participation Level:  Did Not Attend [name] 01/15/2022, 9:31 AM

## 2022-01-15 NOTE — Progress Notes (Signed)
   01/15/22 1000  Psych Admission Type (Psych Patients Only)  Admission Status Voluntary  Psychosocial Assessment  Patient Complaints Worrying  Eye Contact Avoids  Facial Expression Worried  Affect Irritable  Speech Rapid;Pressured  Interaction Needy  Motor Activity Fidgety  Appearance/Hygiene Unremarkable  Behavior Characteristics Irritable  Mood Preoccupied  Thought Process  Coherency Circumstantial  Content Blaming others  Delusions None reported or observed  Perception WDL  Hallucination None reported or observed  Judgment Impaired  Confusion None  Danger to Self  Current suicidal ideation? Denies  Danger to Others  Danger to Others None reported or observed

## 2022-01-15 NOTE — Group Note (Signed)
[facility] LCSW Group Therapy Note  Date/Time: 01/15/2022 @ 1pm  Type of Therapy and Topic:  Group Therapy:  Communication  Participation Level:   Active     Description of Group:    In this group patients will be encouraged to explore how individuals communicate with one another appropriately and inappropriately. Patients will be guided to discuss their thoughts, feelings, and behaviors related to barriers communicating feelings, needs, and stressors. The group will process together ways to execute positive and appropriate communications, with attention given to how one use behavior, tone, and body language to communicate. Patient will be encouraged to reflect on an incident where they were successfully able to communicate and the factors that they believe helped them to communicate. Each patient will be encouraged to identify specific changes they are motivated to make in order to overcome communication barriers with self, peers, authority, and parents. This group will be process-oriented, with patients participating in exploration of their own experiences as well as giving and receiving support and challenging self as well as other group members.  Therapeutic Goals: Patient will identify how people communicate (body language, facial expression, and electronics) Also discuss tone, voice and how these impact what is communicated and how the message is perceived.  Patient will identify feelings (such as fear or worry), thought process and behaviors related to why people internalize feelings rather than express self openly. Patient will identify two changes they are willing to make to overcome communication barriers. Members will then practice through Role Play how to communicate by utilizing psycho-education material (such as I Feel statements and acknowledging feelings rather than displacing on others)   Summary of Patient Progress: Patient participated appropriately in group. Patient had good insight.   She participated in activities and was able to provide and receive feedback from peers.      Therapeutic Modalities:   Cognitive Behavioral Therapy Solution Focused Therapy Motivational Interviewing Family Systems Approach    Ruberta Holck [place], LCSW, [place] Social Worker  [facility]

## 2022-01-15 NOTE — Progress Notes (Signed)
Adult Psychoeducational Group Note  Date:  01/15/2022 Time:  9:10 PM  Group Topic/Focus:  Wrap-Up Group:   The focus of this group is to help patients review their daily goal of treatment and discuss progress on daily workbooks.  Participation Level:  Active  Participation Quality:  Appropriate  Affect:  Appropriate  Cognitive:  Appropriate  Insight: Appropriate  Engagement in Group:  Engaged  Modes of Intervention:  Discussion  Additional Comments:   Pt states that she has had a good day. She states that she is ready to go home, pay her bills, and see her babies. Pt endorses anxiety but states its related to her leaving tomorrow.   Gerhard Perches 01/15/2022, 9:10 PM

## 2022-01-15 NOTE — Progress Notes (Signed)
D- Patient alert and oriented. Denies SI, HI, AVH, and pain. Patient reports anxiety rated at 7/10 that she states in related to her upcoming discharge tomorrow. Patient stated, "I always get anxious the night before I leave this place." Patient presents with pressured speech and was fidgeting during assessment. Patient stated that her goal was to go home and talk to her fiance.  A- Scheduled medications administered to patient along with PRN trazodone '25mg'$  for sleep and hydroxyzine '25mg'$  for anxiety, per MAR. Support and encouragement provided.  Routine safety checks conducted every 15 minutes.  Patient informed to notify staff with problems or concerns.  R- No adverse drug reactions noted. Patient contracts for safety at this time. Patient compliant with medications and treatment plan. Patient receptive, calm, and cooperative. Patient interacts well with others on the unit.  Patient remains safe at this time.

## 2022-01-15 NOTE — Progress Notes (Signed)
Patient and Patient's ACT Team reports that the Patient has her own home/Apartment. No intervention indicated at this time.

## 2022-01-15 NOTE — Progress Notes (Addendum)
Pharmacy:  Called in prescriptions for pt to [facility] [phone].  Asenapine 3.8 mg patch #7 apply one daily NR  Metoprolol 12.5 mg ER tab #7 12.5 mg daily NR Melatonin 3 mg hs #7 NR   Above prescriptions will be filled 01/23/22.  Explained that we will provide a 7 days supply of the above at discharge to patient.  Pharmacy verbalized understanding and was working on approval for early fill of patch as they will not dispense pts own med that is onsite here. This will be discarded after pt discharged.   Pharmacy was also told that [place] was taken off all other meds.  Explained that she is only on albuterol inhaler, Breo inhaler, melatonin, asenapine patch and metoprolol XL  All other medications have been discontinued.  Pharmacist verbally acknowledge understanding.  Einar Grad, [place] D

## 2022-01-15 NOTE — BH IP Treatment Plan (Signed)
Interdisciplinary Treatment and Diagnostic Plan Update  01/15/2022 Time of Session: 8:30am, update [name] MRN: [id number]  Principal Diagnosis: Schizoaffective disorder, bipolar type ([place])  Secondary Diagnoses: Principal Problem:   Schizoaffective disorder, bipolar type ([place]) Active Problems:   Borderline personality disorder ([place])   PTSD (post-traumatic stress disorder)   Asthma   Anxiety state   Self-injurious behavior   Insomnia   Pain in joint, ankle and foot   Current Medications:  Current Facility-Administered Medications  Medication Dose Route Frequency Provider Last Rate Last Admin   albuterol (VENTOLIN HFA) 108 (90 Base) MCG/ACT inhaler 1-2 puff  1-2 puff Inhalation Q6H PRN Massengill, Ovid Curd, MD       Asenapine PT24 3.8 mg  3.8 mg Transdermal Daily Massengill, Nathan, MD   3.8 mg at 01/15/22 0755   fluticasone furoate-vilanterol (BREO ELLIPTA) 200-25 MCG/ACT 1 puff  1 puff Inhalation Daily Massengill, Ovid Curd, MD   1 puff at 01/15/22 0759   hydrOXYzine (ATARAX) tablet 25 mg  25 mg Oral TID PRN Janine Limbo, MD   25 mg at 01/14/22 0745   ibuprofen (ADVIL) tablet 200 mg  200 mg Oral Q6H PRN Nicholes Rough, NP   200 mg at 01/14/22 1929   melatonin tablet 3 mg  3 mg Oral QHS Massengill, Nathan, MD   3 mg at 01/14/22 2021   metoprolol succinate (TOPROL-XL) 24 hr tablet 12.5 mg  12.5 mg Oral Daily Massengill, Ovid Curd, MD   12.5 mg at 01/15/22 5364   neomycin-bacitracin-polymyxin (NEOSPORIN) ointment   Topical PRN Onuoha, Chinwendu V, NP   Given at 01/12/22 2155   OLANZapine (ZYPREXA) injection 10 mg  10 mg Intramuscular TID PRN Massengill, Ovid Curd, MD       OLANZapine (ZYPREXA) tablet 10 mg  10 mg Oral TID PRN Janine Limbo, MD   10 mg at 01/13/22 1232   ondansetron (ZOFRAN) tablet 4 mg  4 mg Oral Q8H PRN Massengill, Ovid Curd, MD   4 mg at 01/13/22 6803   traZODone (DESYREL) tablet 25 mg  25 mg Oral QHS PRN Janine Limbo, MD   25 mg at 01/14/22 2021    PTA Medications: Medications Prior to Admission  Medication Sig Dispense Refill Last Dose   albuterol (VENTOLIN HFA) 108 (90 Base) MCG/ACT inhaler Inhale 2 puffs into the lungs every 6 (six) hours as needed for wheezing or shortness of breath.      Asenapine (SECUADO) 3.8 MG/24HR PT24 Apply 1 patch topically daily. remove old patch prior to applying new one 30 patch 0    budesonide-formoterol (SYMBICORT) 160-4.5 MCG/ACT inhaler Inhale 2 puffs into the lungs 2 (two) times daily.       Patient Stressors:    Patient Strengths:    Treatment Modalities: Medication Management, Group therapy, Case management,  1 to 1 session with clinician, Psychoeducation, Recreational therapy.   Physician Treatment Plan for Primary Diagnosis: Schizoaffective disorder, bipolar type ([place]) Long Term Goal(s):     Short Term Goals:    Medication Management: Evaluate patient's response, side effects, and tolerance of medication regimen.  Therapeutic Interventions: 1 to 1 sessions, Unit Group sessions and Medication administration.  Evaluation of Outcomes: Progressing  Physician Treatment Plan for Secondary Diagnosis: Principal Problem:   Schizoaffective disorder, bipolar type ([place]) Active Problems:   Borderline personality disorder ([place])   PTSD (post-traumatic stress disorder)   Asthma   Anxiety state   Self-injurious behavior   Insomnia   Pain in joint, ankle and foot  Long Term Goal(s):  Short Term Goals:       Medication Management: Evaluate patient's response, side effects, and tolerance of medication regimen.  Therapeutic Interventions: 1 to 1 sessions, Unit Group sessions and Medication administration.  Evaluation of Outcomes: Progressing   RN Treatment Plan for Primary Diagnosis: Schizoaffective disorder, bipolar type ([place]) Long Term Goal(s): Knowledge of disease and therapeutic regimen to maintain health will improve  Short Term Goals: Ability to remain free from injury will  improve, Ability to verbalize frustration and anger appropriately will improve, Ability to demonstrate self-control, Ability to participate in decision making will improve, Ability to verbalize feelings will improve, Ability to disclose and discuss suicidal ideas, Ability to identify and develop effective coping behaviors will improve, and Compliance with prescribed medications will improve  Medication Management: RN will administer medications as ordered by provider, will assess and evaluate patient's response and provide education to patient for prescribed medication. RN will report any adverse and/or side effects to prescribing provider.  Therapeutic Interventions: 1 on 1 counseling sessions, Psychoeducation, Medication administration, Evaluate responses to treatment, Monitor vital signs and CBGs as ordered, Perform/monitor CIWA, COWS, AIMS and Fall Risk screenings as ordered, Perform wound care treatments as ordered.  Evaluation of Outcomes: Progressing   LCSW Treatment Plan for Primary Diagnosis: Schizoaffective disorder, bipolar type ([place]) Long Term Goal(s): Safe transition to appropriate next level of care at discharge, Engage patient in therapeutic group addressing interpersonal concerns.  Short Term Goals: Engage patient in aftercare planning with referrals and resources, Increase social support, Increase ability to appropriately verbalize feelings, Increase emotional regulation, Facilitate acceptance of mental health diagnosis and concerns, Facilitate patient progression through stages of change regarding substance use diagnoses and concerns, Identify triggers associated with mental health/substance abuse issues, and Increase skills for wellness and recovery  Therapeutic Interventions: Assess for all discharge needs, 1 to 1 time with Social worker, Explore available resources and support systems, Assess for adequacy in community support network, Educate family and significant other(s) on  suicide prevention, Complete Psychosocial Assessment, Interpersonal group therapy.  Evaluation of Outcomes: Progressing   Progress in Treatment: Attending groups: Yes. Participating in groups: Yes. Taking medication as prescribed: Yes. Toleration medication: Yes. Family/Significant other contact made: Yes, individual(s) contacted:  [facility] CST team. Patient understands diagnosis: No. Discussing patient identified problems/goals with staff: Yes. Medical problems stabilized or resolved: Yes. Denies suicidal/homicidal ideation: Yes. Issues/concerns per patient self-inventory: No.  New problem(s) identified: No, Describe:  none reported   New Short Term/Long Term Goal(s):  medication stabilization, elimination of SI thoughts, development of comprehensive mental wellness plan.     Patient Goals: Pt continues to work on initial treatment team goals. Care Team will continue to provide support and interventions to assist patient with meeting goals.    Discharge Plan or Barriers: Pt will continue to follow up with CST team.  Drs plan is to discharge with very little oral medication.  Patient impulsively will overdose when stressed.  No additional psychosocial barriers at this time.   Reason for Continuation of Hospitalization: Anxiety Depression Medication stabilization Suicidal ideation Other; describe personality disorder and poor impulse control  Estimated Length of Stay: 1-3 days.   Last 3 [place] Suicide Severity Risk Score: Flowsheet Row ED to Hosp-Admission (Discharged) from 12/27/2021 in [place] [facility] ED from 12/12/2021 in [place] ED from 12/10/2021 in [place] High Risk No Risk Moderate Risk       Last [facility] 2/9  Scores:    06/27/2021    3:18 AM 05/01/2021    2:16 AM 02/03/2020    2:24 AM  Depression screen PHQ 2/9  Decreased Interest '1 2 3  '$ Down,  Depressed, Hopeless '2 3 3  '$ PHQ - 2 Score '3 5 6  '$ Altered sleeping 0 0 3  Tired, decreased energy '1 1 3  '$ Change in appetite 0 1 3  Feeling bad or failure about yourself  '1 3 3  '$ Trouble concentrating '2 3 3  '$ Moving slowly or fidgety/restless '1 1 3  '$ Suicidal thoughts '1 3 3  '$ PHQ-9 Score '9 17 27  '$ Difficult doing work/chores Somewhat difficult Extremely dIfficult     Scribe for Treatment Team: Zachery Conch, LCSW 01/15/2022 10:17 AM

## 2022-01-16 DIAGNOSIS — F250 Schizoaffective disorder, bipolar type: Secondary | ICD-10-CM | POA: Diagnosis not present

## 2022-01-16 MED ORDER — METOPROLOL SUCCINATE ER 25 MG PO TB24: 12.5000 mg | ORAL_TABLET | Freq: Every day | ORAL | 0 refills | Status: DC

## 2022-01-16 MED ORDER — MELATONIN 3 MG PO TABS: 3.0000 mg | ORAL_TABLET | Freq: Every day | ORAL | 0 refills | Status: DC

## 2022-01-16 MED ORDER — SECUADO 3.8 MG/24HR TD PT24: 3.8000 mg | MEDICATED_PATCH | Freq: Every day | TRANSDERMAL | 0 refills | Status: AC

## 2022-01-16 MED ORDER — MELATONIN 3 MG PO TABS: 3.0000 mg | ORAL_TABLET | Freq: Every day | ORAL | 0 refills | Status: AC

## 2022-01-16 MED ORDER — SECUADO 3.8 MG/24HR TD PT24: 3.8000 mg | MEDICATED_PATCH | Freq: Every day | TRANSDERMAL | 0 refills | Status: DC

## 2022-01-16 NOTE — BHH Suicide Risk Assessment (Cosign Needed Addendum)
Suicide Risk Assessment  Discharge Assessment    [name] Discharge Suicide Risk Assessment   Principal Problem: Schizoaffective disorder, bipolar type [facility]) Discharge Diagnoses: Principal Problem:   Schizoaffective disorder, bipolar type ([place]) Active Problems:   Borderline personality disorder ([place])   PTSD (post-traumatic stress disorder)   Asthma   Anxiety state   Self-injurious behavior   Insomnia   Pain in joint, ankle and foot  Reason For Admission: [name] is a 31 yo Caucasian female with prior mental health health diagnoses of borderline personality d/o & Schizoaffective d/o who [facility] after she called the crises hotline number to report that she had overdosed on multiple medications in an attempt to end her life. As per ED assessment, "she took 6 '40mg'$  Zofran pills, 6 '25mg'$  Atenolol pills, 6 '10mg'$  Cyclobenzapr pills, 18 '5mg'$  escitalopram pills, 18 '10mg'$  Hydroxyz pills, 6 '40mg'$  pantroprazole pills, 6 '1mg'$  Prazosin hcl pills, 18 '50mg'$  pregabalin pills, 3 '25mg'$  Quetiapine pills, and 6 1.'5mg'$  vraylar pills."  Patient was involuntarily committed and transferred to this behavioral health Hospital for treatment and stabilization of her mood.                                               HOSPITAL COURSE During the patient's hospitalization, patient had extensive initial psychiatric evaluation, and follow-up psychiatric evaluations every day.  Diagnoses provided upon initial assessment were: Principal Problem:   Schizoaffective disorder, bipolar type ([place]) Active Problems:   Borderline personality disorder ([facility])   PTSD (post-traumatic stress disorder)   Asthma   Anxiety state   Self-injurious behavior   Insomnia   Pain in joint, ankle and foot  Patient's psychiatric medications were adjusted on admission as follows: -Saphris patch was started on admission (patch is a safe choice for pt currently d/t h/o multiple overdoses on PO medications. QTC with QTC-456, repeat EKG  with Qtc 477) -Discontinued Seroquel 25 mg BID on 11/29 -Discontinued Inderal 5 mg Q 12 H for tachycardia on 11/29.  During the hospitalization, other adjustments were made to the patient's psychiatric medication regimen, with medications at discharge being as follows: -Continue Saphris patch 3.8 mg for psychosis & mood stabilization -Continue Melatonin 3 mg nightly for insomnia -Continue Toprol XL 12.5 mg daily for tachycardia on 01/04/22 -Continue Breo Ellipta inhaler daily for asthma -Continue Albuterol PRN for SOB/Wheezing  Pt was educated on benefits and rationales of an LAI medication, but refused offers, stating that she does not like needles. Patient's care was discussed during the interdisciplinary team meeting every day during the hospitalization.   The patient denies having side effects to prescribed psychiatric medication.  Gradually, patient started adjusting to milieu. The patient was evaluated each day by a clinical provider to ascertain response to treatment. Improvement was noted by the patient's report of decreasing symptoms, improved sleep and appetite, affect, medication tolerance, behavior, and participation in unit programming.  Patient was asked each day to complete a self inventory noting mood, mental status, pain, new symptoms, anxiety and concerns.    Symptoms were reported as significantly decreased or resolved completely by discharge. On day of discharge, the patient reports that their mood is stable. The patient denied having suicidal thoughts for more than 48 hours prior to discharge.  Patient denies having homicidal thoughts.  Patient denies having auditory hallucinations.  Patient denies any visual hallucinations or  other symptoms of psychosis. The patient was motivated to continue taking medication with a goal of continued improvement in mental health.   The patient reports their target psychiatric symptoms of psychosis, depression and anxiety responded well to  the psychiatric medications, and the patient reports overall benefit from this psychiatric hospitalization. Supportive psychotherapy was provided to the patient. The patient also participated in regular group therapy while hospitalized. Coping skills, problem solving as well as relaxation therapies were also part of the unit programming.  Labs were reviewed with the patient, and abnormal results were discussed with the patient.  Total Time spent with patient: 30 minutes  Musculoskeletal: Strength & Muscle Tone: within normal limits Gait & Station: normal Patient leans: N/A  Psychiatric Specialty Exam  Presentation  General Appearance:  Appropriate for Environment; Fairly Groomed  Eye Contact: Good  Speech: Clear and Coherent  Speech Volume: Normal  Handedness: Right  Mood and Affect  Mood: Euthymic  Duration of Depression Symptoms: Less than two weeks  Affect: Appropriate; Congruent  Thought Process  Thought Processes: Coherent  Descriptions of Associations:Intact  Orientation:Full (Time, Place and Person)  Thought Content:Logical  History of Schizophrenia/Schizoaffective disorder:Yes  Duration of Psychotic Symptoms:Greater than six months  Hallucinations:Hallucinations: None  Ideas of Reference:None  Suicidal Thoughts:Suicidal Thoughts: No  Homicidal Thoughts:Homicidal Thoughts: No  Sensorium  Memory: Immediate Good  Judgment: Good  Insight: Good  Executive Functions  Concentration: Good  Attention Span: Good  Recall: Good  Fund of Knowledge: Good  Language: Good  Psychomotor Activity  Psychomotor Activity: Psychomotor Activity: Normal  Assets  Assets: Communication Skills  Sleep  Sleep: Sleep: Good  Physical Exam: Physical Exam Constitutional:      Appearance: Normal appearance.  HENT:     Head: Normocephalic.     Nose: Nose normal.  Eyes:     Pupils: Pupils are equal, round, and reactive to light.   Musculoskeletal:     Cervical back: Normal range of motion.  Neurological:     Mental Status: She is alert and oriented to person, place, and time.     Sensory: No sensory deficit.     Coordination: Coordination normal.  Psychiatric:        Thought Content: Thought content normal.    Review of Systems  Constitutional:  Negative for fever.  HENT: Negative.    Eyes:  Negative for blurred vision.  Respiratory: Negative.  Negative for cough.   Cardiovascular: Negative.  Negative for chest pain.  Gastrointestinal:  Negative for heartburn.  Genitourinary: Negative.   Musculoskeletal: Negative.   Skin: Negative.   Neurological:  Negative for dizziness.  Endo/Heme/Allergies:  Does not bruise/bleed easily.  Psychiatric/Behavioral:  Positive for depression (resolving on current medications, denies SI/HI/AVH, denies paranoia and verbally contracts for safety outside of this hospital). Negative for hallucinations, memory loss, substance abuse and suicidal ideas. The patient is nervous/anxious (resolving on current medications) and has insomnia (resolving on current medications).    Blood pressure 110/64, pulse (!) 102, temperature 97.8 F (36.6 C), temperature source Oral, SpO2 100 %. There is no height or weight on file to calculate BMI.  Mental Status Per Nursing Assessment::   On Admission:     Demographic Factors:  Low socioeconomic status, Living alone, and Unemployed  Loss Factors: NA  Historical Factors: Prior suicide attempts and Impulsivity  Risk Reduction Factors:   Positive social support-States that her CST community support team is very helpful.  Continued Clinical Symptoms:  Pt reports that her depressive symptoms have significantly  reduced since admission. Psychosis has also resolved and pt currently denies SI, denies HI, denies AVH, denies paranoia and there is no evidence of other delusional thinking. Pt verbally contracts for safety outside of this  hospital.  Cognitive Features That Contribute To Risk:  None    Suicide Risk:  Mild:  There are no identifiable suicide plans, no associated intent, mild dysphoria and related symptoms, good self-control (both objective and subjective assessment), few other risk factors, and identifiable protective factors, including available and accessible social support.    Follow-up Information     [facility]. Call.   Why: Please call your CST Team and inform them of your discharge from the  hospital. You can also reach Butch Penny, therapist, at [phone] or Jeanie at [phone]. Contact information: [place] [address] [place], [place] on 01/23/2022.   Why: You have an appointment for medication management services on 01/23/22 at 11:30 am. * Address: [address] [place], [place]. Contact information: [place] [address] [phone]                Plan Of Care/Follow-up recommendations:  The patient is able to verbalize their individual safety plan to this provider.  # It is recommended to the patient to continue psychiatric medications as prescribed, after discharge from the hospital.    # It is recommended to the patient to follow up with your outpatient psychiatric provider and PCP.  # It was discussed with the patient, the impact of alcohol, drugs, tobacco have been there overall psychiatric and medical wellbeing, and total abstinence from substance use was recommended the patient.ed.  # Prescriptions provided or sent directly to preferred pharmacy at discharge. Patient agreeable to plan. Given opportunity to ask questions. Appears to feel comfortable with discharge.    # In the event of worsening symptoms, the patient is instructed to call the crisis hotline (988), 911 and or go to the nearest ED for appropriate evaluation and treatment of symptoms. To follow-up with primary care  provider for other medical issues, concerns and or health care needs  # Patient was discharged home with a plan to follow up as noted above.   Nicholes Rough, NP 01/16/2022, 10:42 AM

## 2022-01-16 NOTE — Progress Notes (Signed)
Pt discharged to lobby. Pt was stable and appreciative at that time. All papers and prescriptions were given and valuables returned. Verbal understanding expressed. Denies SI/HI and A/VH. Pt given opportunity to express concerns and ask questions.  

## 2022-01-16 NOTE — Group Note (Signed)
Recreation Therapy Group Note   Group Topic:Other  Group Date: 01/16/2022 Start Time: 1005 End Time: 1030 Facilitators: Jacinta Penalver-McCall, LRT,CTRS Location: [address] Dayroom   Goal Area(s) Addresses:  Patient will participate in the creative process to complete all crafts.  Patient will interact pro-socially with staff and peers. Patient will share traditions, activities, and positive feelings produced during the holidays.    Group Description: LRT facilitated a therapeutic art activity to encourage self-expression and creativity in recognition of the approaching holidays. Writer explained patients could make snowmen, wreaths or missle that  will be used to decorate the unit to boost mood and create a positive atmosphere. Patients were encouraged to engage in leisure discussions about festive activities and hobbies they like to participate in during the winter season.    Affect/Mood: Appropriate   Participation Level: Engaged   Participation Quality: Independent   Behavior: Appropriate and Preoccupied   Speech/Thought Process: Distracted   Insight: Moderate   Judgement: Moderate   Modes of Intervention: Art   Patient Response to Interventions:  Engaged   Education Outcome:  Acknowledges education and In group clarification offered    Clinical Observations/Individualized Feedback: Pt was engaged.  Pt completed a wreath for the unit but was still focused on discharge.  Pt was social and talked about possibly moving to [place] with fiancee'.  Pt expressed dating fiancee' since April, that she met on Facebook,  and they were going to get married.  Pt also stated she was going to get her daughter a IT sales professional and two books.  Pt was anxious about discharge but looking forward to it.     Plan: Continue to engage patient in RT group sessions 2-3x/week.   [name], LRT,CTRS 01/16/2022 10:33 AM

## 2022-01-16 NOTE — Discharge Summary (Signed)
Physician Discharge Summary Note  Patient:  [name] is an 31 y.o., female MRN:  [id number] DOB:  [birth year] Patient phone:  [phone] (home)  Patient address:   [place] [address],  Total Time spent with patient: 30 minutes  Date of Admission:  01/11/2022 Date of Discharge: 01/16/2022  Reason For Admission: [name] is a 31 yo Caucasian female with prior mental health health diagnoses of borderline personality d/o & Schizoaffective d/o who was taken to the [facility] by [facility] after she called the crises hotline number to report that she had overdosed on multiple medications in an attempt to end her life. As per ED assessment, "she took 6 '40mg'$  Zofran pills, 6 '25mg'$  Atenolol pills, 6 '10mg'$  Cyclobenzapr pills, 18 '5mg'$  escitalopram pills, 18 '10mg'$  Hydroxyz pills, 6 '40mg'$  pantroprazole pills, 6 '1mg'$  Prazosin hcl pills, 18 '50mg'$  pregabalin pills, 3 '25mg'$  Quetiapine pills, and 6 1.'5mg'$  vraylar pills."  Patient was involuntarily committed and transferred to this behavioral health Hospital for treatment and stabilization of her mood.      Principal Problem: Schizoaffective disorder, bipolar type [facility]) Discharge Diagnoses: Principal Problem:   Schizoaffective disorder, bipolar type ([place]) Active Problems:   Borderline personality disorder ([place])   PTSD (post-traumatic stress disorder)   Asthma   Anxiety state   Self-injurious behavior   Insomnia   Pain in joint, ankle and foot  Past Psychiatric History: Schizoaffective d/o  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 fragile X syndrome    Chronic constipation    Depression    Drug-seeking behavior    Essential tremor    Headache    Overdose of acetaminophen 07/2017   and other meds   Personality disorder ([place])    Schizo-affective psychosis ([place])    Schizoaffective disorder, bipolar type  ([place])    Seizures ([place])    Last seizure December 2017   Sleep apnea     Past Surgical History:  Procedure Laterality Date   MOUTH SURGERY  2009 or 2010   Family History:  Family History  Problem Relation Age of Onset   Mental illness Father    Asthma Father    PDD Brother    Seizures Brother    Family Psychiatric  History: As above Social History:  Social History   Substance and Sexual Activity  Alcohol Use No   Alcohol/week: 1.0 standard drink of alcohol   Types: 1 Standard drinks or equivalent per week   Comment: denies at this time     Social History   Substance and Sexual Activity  Drug Use No   Comment: History of cocaine use at age 36 for 4 months    Social History   Socioeconomic History   Marital status: Widowed    Spouse name: Not on file   Number of children: 0   Years of education: Not on file   Highest education level: Not on file  Occupational History   Occupation: disability  Tobacco Use   Smoking status: Former    Packs/day: 0.00    Types: Cigarettes   Smokeless tobacco: Never   Tobacco comments:    Smoked for 2  years age [age]  Vaping Use   Vaping Use: Never used  Substance and Sexual Activity   Alcohol use: No    Alcohol/week: 1.0  standard drink of alcohol    Types: 1 Standard drinks or equivalent per week    Comment: denies at this time   Drug use: No    Comment: History of cocaine use at age 36 for 4 months   Sexual activity: Not Currently    Birth control/protection: None  Other Topics Concern   Not on file  Social History Narrative   Marital status: Widowed      Children: daughter      Lives: with boyfriend, in two story home      Employment:  Disability      Tobacco: quit smoking; smoked for two years.      Alcohol ;none      Drugs: none   Has not traveled outside of the country.   Right handed         Social Determinants of Health   Financial Resource Strain: Not on file  Food Insecurity: No Food Insecurity  (01/01/2022)   Hunger Vital Sign    Worried About Running Out of Food in the Last Year: Never true    [place] in the Last Year: Never true  Recent Concern: Food Insecurity - Food Insecurity Present (12/28/2021)   Hunger Vital Sign    Worried About Charity fundraiser in the Last Year: Often true    Ran Out of Food in the Last Year: Often true  Transportation Needs: Unmet Transportation Needs (01/01/2022)   PRAPARE - Hydrologist (Medical): No    Lack of Transportation (Non-Medical): Yes  Physical Activity: Not on file  Stress: Not on file  Social Connections: Not on file                                                        [place] During the patient's hospitalization, patient had extensive initial psychiatric evaluation, and follow-up psychiatric evaluations every day.  Diagnoses provided upon initial assessment were: Principal Problem:   Schizoaffective disorder, bipolar type ([place]) Active Problems:   Borderline personality disorder ([facility])   PTSD (post-traumatic stress disorder)   Asthma   Anxiety state   Self-injurious behavior   Insomnia   Pain in joint, ankle and foot   Patient's psychiatric medications were adjusted on admission as follows: -Saphris patch was started on admission (patch is a safe choice for pt currently d/t h/o multiple overdoses on PO medications. QTC with QTC-456, repeat EKG with Qtc 477) -Discontinued Seroquel 25 mg BID on 11/29 -Discontinued Inderal 5 mg Q 12 H for tachycardia on 11/29.   During the hospitalization, other adjustments were made to the patient's psychiatric medication regimen, with medications at discharge being as follows: -Continue Saphris patch 3.8 mg for psychosis & mood stabilization -Continue Melatonin 3 mg nightly for insomnia -Continue Toprol XL 12.5 mg daily for tachycardia on 01/04/22 -Continue Breo Ellipta inhaler daily for asthma -Continue Albuterol PRN for SOB/Wheezing   Pt was  educated on benefits and rationales of an LAI medication, but refused offers, stating that she does not like needles. Patient's care was discussed during the interdisciplinary team meeting every day during the hospitalization.     The patient denies having side effects to prescribed psychiatric medication.   Gradually, patient started adjusting to milieu. The patient was evaluated each day by a clinical  provider to ascertain response to treatment. Improvement was noted by the patient's report of decreasing symptoms, improved sleep and appetite, affect, medication tolerance, behavior, and participation in unit programming.  Patient was asked each day to complete a self inventory noting mood, mental status, pain, new symptoms, anxiety and concerns.     Symptoms were reported as significantly decreased or resolved completely by discharge. On day of discharge, the patient reports that their mood is stable. The patient denied having suicidal thoughts for more than 48 hours prior to discharge.  Patient denies having homicidal thoughts.  Patient denies having auditory hallucinations.  Patient denies any visual hallucinations or other symptoms of psychosis. The patient was motivated to continue taking medication with a goal of continued improvement in mental health.    The patient reports their target psychiatric symptoms of psychosis, depression and anxiety responded well to the psychiatric medications, and the patient reports overall benefit from this psychiatric hospitalization. Supportive psychotherapy was provided to the patient. The patient also participated in regular group therapy while hospitalized. Coping skills, problem solving as well as relaxation therapies were also part of the unit programming.   Labs were reviewed with the patient, and abnormal results were discussed with the patient.   Physical Findings: AIMS: Facial and Oral Movements Muscles of Facial Expression: None, normal Lips and  Perioral Area: None, normal Jaw: None, normal Tongue: None, normal,Extremity Movements Upper (arms, wrists, hands, fingers): None, normal Lower (legs, knees, ankles, toes): None, normal, Trunk Movements Neck, shoulders, hips: None, normal, Overall Severity Severity of abnormal movements (highest score from questions above): None, normal Incapacitation due to abnormal movements: None, normal Patient's awareness of abnormal movements (rate only patient's report): No Awareness, Dental Status Current problems with teeth and/or dentures?: No Does patient usually wear dentures?: No  CIWA: n/a   COWS:  n/a   Musculoskeletal: Strength & Muscle Tone: within normal limits Gait & Station: normal Patient leans: N/A   Psychiatric Specialty Exam:  Presentation  General Appearance:  Appropriate for Environment; Fairly Groomed  Eye Contact: Good  Speech: Clear and Coherent  Speech Volume: Normal  Handedness: Right   Mood and Affect  Mood: Euthymic  Affect: Appropriate; Congruent   Thought Process  Thought Processes: Coherent  Descriptions of Associations:Intact  Orientation:Full (Time, Place and Person)  Thought Content:Logical  History of Schizophrenia/Schizoaffective disorder:Yes  Duration of Psychotic Symptoms:Greater than six months  Hallucinations:Hallucinations: None  Ideas of Reference:None  Suicidal Thoughts:Suicidal Thoughts: No  Homicidal Thoughts:Homicidal Thoughts: No   Sensorium  Memory: Immediate Good  Judgment: Good  Insight: Good   Executive Functions  Concentration: Good  Attention Span: Good  Recall: Good  Fund of Knowledge: Good  Language: Good   Psychomotor Activity  Psychomotor Activity: Psychomotor Activity: Normal   Assets  Assets: Communication Skills   Sleep  Sleep: Sleep: Good    Physical Exam: Physical Exam Constitutional:      Appearance: Normal appearance.  HENT:     Nose: Nose normal.   Eyes:     Pupils: Pupils are equal, round, and reactive to light.  Pulmonary:     Effort: Pulmonary effort is normal.  Musculoskeletal:        General: Normal range of motion.  Neurological:     General: No focal deficit present.     Mental Status: She is alert and oriented to person, place, and time.    Review of Systems  Constitutional:  Negative for fever.  HENT: Negative.    Eyes: Negative.  Respiratory: Negative.    Cardiovascular: Negative.   Gastrointestinal: Negative.   Genitourinary: Negative.   Musculoskeletal: Negative.   Skin: Negative.   Neurological: Negative.   Psychiatric/Behavioral:  Positive for depression (Reports that depressive symptons have significantly improved and denies SI,denies HI, denies AVH, verbally contracts for safety outside of [facility]). Negative for hallucinations, memory loss, substance abuse and suicidal ideas. The patient is not nervous/anxious and does not have insomnia.    Blood pressure 110/64, pulse (!) 102, temperature 97.8 F (36.6 C), temperature source Oral, SpO2 100 %. There is no height or weight on file to calculate BMI.   Social History   Tobacco Use  Smoking Status Former   Packs/day: 0.00   Types: Cigarettes  Smokeless Tobacco Never  Tobacco Comments   Smoked for 2  years age [age]   Tobacco Cessation:  N/A, patient does not currently use tobacco products   Blood Alcohol level:  Lab Results  Component Value Date   ETH <10 10/29/2021   ETH <10 19/50/9326    Metabolic Disorder Labs:  Lab Results  Component Value Date   HGBA1C 5.0 10/07/2021   MPG 96.8 10/07/2021   MPG 96.8 09/02/2021   Lab Results  Component Value Date   PROLACTIN 66.2 (H) 03/02/2021   PROLACTIN 66.1 (H) 02/04/2020   Lab Results  Component Value Date   CHOL 175 12/10/2021   TRIG 145 12/10/2021   HDL 44 12/10/2021   CHOLHDL 4.0 12/10/2021   VLDL 29 12/10/2021   LDLCALC 102 (H) 12/10/2021   LDLCALC 74 10/07/2021    See Psychiatric  Specialty Exam and Suicide Risk Assessment completed by Attending Physician prior to discharge.  Discharge destination:  Home  Is patient on multiple antipsychotic therapies at discharge:  No   Has Patient had three or more failed trials of antipsychotic monotherapy by history:  No  Recommended Plan for Multiple Antipsychotic Therapies: NA  Discharge Instructions     Diet - low sodium heart healthy   Complete by: As directed    Increase activity slowly   Complete by: As directed       Allergies as of 01/16/2022       Reactions   Bee Venom Anaphylaxis   Coconut Flavor Anaphylaxis, Rash   Fish Allergy Anaphylaxis   Geodon [ziprasidone Hcl] Other (See Comments)   Pt states that this medication causes paralysis of the mouth.     Haloperidol And Related Other (See Comments)   Pt states that this medication causes paralysis of the mouth, jaw locks up   Lithobid [lithium] Other (See Comments)   Seizure-like activity   Roxicodone [oxycodone] Other (See Comments)   Hallucinations    Seroquel [quetiapine] Other (See Comments)   Severe drowsiness Pt currently taking 12.5 mg BID, she is ok with this dose   Shellfish Allergy Anaphylaxis   Phenergan [promethazine Hcl] Other (See Comments)   Chest pain    Prilosec [omeprazole] Nausea And Vomiting   Pt can take protonix with no problems   Sulfa Antibiotics Other (See Comments)   Chest pain    Tegretol [carbamazepine] Nausea And Vomiting   Prozac [fluoxetine] Other (See Comments)   Increased Depression Suicidal thoughts   Tape Other (See Comments)   Skin tears, can only tolerate paper tape.   Tylenol [acetaminophen] Other (See Comments)   Unknown reaction Pt states she can not take        Medication List     TAKE these medications  Indication  albuterol 108 (90 Base) MCG/ACT inhaler Commonly known as: VENTOLIN HFA Inhale 2 puffs into the lungs every 6 (six) hours as needed for wheezing or shortness of breath.   Indication: Asthma   budesonide-formoterol 160-4.5 MCG/ACT inhaler Commonly known as: SYMBICORT Inhale 2 puffs into the lungs 2 (two) times daily.  Indication: Asthma   melatonin 3 MG Tabs tablet Take 1 tablet (3 mg total) by mouth at bedtime for 7 days. Start taking on: January 23, 2022  Indication: Trouble Sleeping   metoprolol succinate 25 MG 24 hr tablet Commonly known as: TOPROL-XL Take 0.5 tablets (12.5 mg total) by mouth daily for 7 doses. Start taking on: January 23, 2022  Indication: High Blood Pressure Disorder   Secuado 3.8 MG/24HR Pt24 Generic drug: Asenapine Place 3.8 mg onto the skin daily for 7 days. Start taking on: January 23, 2022 What changed:  how much to take how to take this These instructions start on January 23, 2022. If you are unsure what to do until then, ask your doctor or other care provider.  Indication: Schizophrenia        Follow-up Information     [facility]. Call.   Why: Please call your CST Team and inform them of your discharge from the  hospital. You can also reach Butch Penny, therapist, at [phone] or Jeanie at [phone]. Contact information: [place] [address] [place], [place] on 01/23/2022.   Why: You have an appointment for medication management services on 01/23/22 at 11:30 am. * Address: [address] [place], [place]. Contact information: [place] [address] [phone]                Follow-up recommendations:   The patient is able to verbalize their individual safety plan to this provider.   # It is recommended to the patient to continue psychiatric medications as prescribed, after discharge from the hospital.     # It is recommended to the patient to follow up with your outpatient psychiatric provider and PCP.   # It was discussed with the patient, the impact of alcohol, drugs, tobacco have been there overall  psychiatric and medical wellbeing, and total abstinence from substance use was recommended the patient.ed.   # Prescriptions provided or sent directly to preferred pharmacy at discharge. Patient agreeable to plan. Given opportunity to ask questions. Appears to feel comfortable with discharge.    # In the event of worsening symptoms, the patient is instructed to call the crisis hotline (988), 911 and or go to the nearest ED for appropriate evaluation and treatment of symptoms. To follow-up with primary care provider for other medical issues, concerns and or health care needs   # Patient was discharged home with a plan to follow up as noted above.   Signed: Nicholes Rough, NP 01/16/2022, 6:10 PM

## 2022-01-16 NOTE — Progress Notes (Signed)
  [facility] Case Management Discharge Plan :  Will you be returning to the same living situation after discharge:  Yes,  Home  At discharge, do you have transportation home?: Yes,  Purchasing her own Darrelyn Hillock you have the ability to pay for your medications: Yes,  [employer]   Release of information consent forms completed and in the chart;  Patient's signature needed at discharge.  Patient to Follow up at:  Follow-up Information     [facility]. Call.   Why: Please call your CST Team and inform them of your discharge from the  hospital. You can also reach Butch Penny, therapist, at [phone] or Jeanie at [phone]. Contact information: [place] [address] [employer], [place] on 01/23/2022.   Why: You have an appointment for medication management services on 01/23/22 at 11:30 am. * Address: [address], [place], [place]. Contact information: [place] [address] [phone]                 Next level of care provider has access to [place] and Suicide Prevention discussed: Yes,  with patient and CST Team      Has patient been referred to the Quitline?: N/A patient is not a smoker  Patient has been referred for addiction treatment: N/A  Darleen Crocker, [place] 01/16/2022, 10:11 AM

## 2022-01-16 NOTE — Progress Notes (Signed)
   01/16/22 0519  Sleep  Number of Hours 5.5

## 2022-01-16 NOTE — Discharge Instructions (Signed)
-  Follow-up with your outpatient psychiatric provider -instructions on appointment date, time, and address (location) are provided to you in discharge paperwork.  -Take your psychiatric medications as prescribed at discharge - instructions are provided to you in the discharge paperwork You are getting 7 days of medication samples at discharge.   -Follow-up with outpatient primary care doctor and other specialists -for management of preventative medicine and any chronic medical disease.  -Recommend abstinence from alcohol, tobacco, and other illicit drug use at discharge.   -If your psychiatric symptoms recur, worsen, or if you have side effects to your psychiatric medications, call your outpatient psychiatric provider, 911, [phone] or go to the nearest emergency department.  -If suicidal thoughts occur, call your outpatient psychiatric provider, 911, [phone] or go to the nearest emergency department.  Naloxone (Narcan) can help reverse an overdose when given to the victim quickly.  [facility] offers free naloxone kits and instructions/training on its use.  Add naloxone to your first aid kit and you can help save a life.   Pick up your free kit at the following locations:   [place]:  [place], [place] [address] [phone]) [facility] [place] [id number] [phone]) [facility] [place]  [place]: [place] [address] [place] [address] [phone]) [facility] [place] [address] [phone])

## 2022-01-18 ENCOUNTER — Encounter (HOSPITAL_COMMUNITY): Payer: Self-pay | Admitting: Emergency Medicine

## 2022-01-18 ENCOUNTER — Emergency Department (HOSPITAL_COMMUNITY)
Admission: EM | Admit: 2022-01-18 | Discharge: 2022-01-18 | Disposition: A | Discharge status: Home / Self Care | Payer: PPO | Attending: Emergency Medicine | Admitting: Emergency Medicine

## 2022-01-18 ENCOUNTER — Other Ambulatory Visit: Payer: Self-pay

## 2022-01-18 ENCOUNTER — Emergency Department (HOSPITAL_COMMUNITY): Payer: PPO

## 2022-01-18 DIAGNOSIS — R059 Cough, unspecified: Secondary | ICD-10-CM | POA: Diagnosis not present

## 2022-01-18 DIAGNOSIS — R000 Tachycardia, unspecified: Secondary | ICD-10-CM | POA: Diagnosis not present

## 2022-01-18 DIAGNOSIS — U071 COVID-19: Secondary | ICD-10-CM

## 2022-01-18 LAB — COMPREHENSIVE METABOLIC PANEL
ALT: 19 U/L (ref 0–44)
AST: 27 U/L (ref 15–41)
Albumin: 3.7 g/dL (ref 3.5–5.0)
Alkaline Phosphatase: 74 U/L (ref 38–126)
Anion gap: 10 (ref 5–15)
BUN: 10 mg/dL (ref 6–20)
CO2: 21 mmol/L — ABNORMAL LOW (ref 22–32)
Calcium: 9 mg/dL (ref 8.9–10.3)
Chloride: 107 mmol/L (ref 98–111)
Creatinine, Ser: 0.79 mg/dL (ref 0.44–1.00)
GFR, Estimated: >60 mL/min (ref >60)
Glucose, Bld: 103 mg/dL — ABNORMAL HIGH (ref 70–99)
Potassium: 3.6 mmol/L (ref 3.5–5.1)
Sodium: 138 mmol/L (ref 135–145)
Total Bilirubin: 0.3 mg/dL (ref 0.3–1.2)
Total Protein: 7.1 g/dL (ref 6.5–8.1)

## 2022-01-18 LAB — CBC WITH DIFFERENTIAL/PLATELET
Abs Immature Granulocytes: 0.03 10*3/uL (ref 0.00–0.07)
Basophils Absolute: 0 10*3/uL (ref 0.0–0.1)
Basophils Relative: 0 %
Eosinophils Absolute: 0 10*3/uL (ref 0.0–0.5)
Eosinophils Relative: 0 %
HCT: 37.2 % (ref 36.0–46.0)
Hemoglobin: 11.6 g/dL — ABNORMAL LOW (ref 12.0–15.0)
Immature Granulocytes: 0 %
Lymphocytes Relative: 10 %
Lymphs Abs: 0.7 10*3/uL (ref 0.7–4.0)
MCH: 25.3 pg — ABNORMAL LOW (ref 26.0–34.0)
MCHC: 31.2 g/dL (ref 30.0–36.0)
MCV: 81 fL (ref 80.0–100.0)
Monocytes Absolute: 0.5 10*3/uL (ref 0.1–1.0)
Monocytes Relative: 8 %
Neutro Abs: 5.8 10*3/uL (ref 1.7–7.7)
Neutrophils Relative %: 82 %
Platelets: 293 10*3/uL (ref 150–400)
RBC: 4.59 MIL/uL (ref 3.87–5.11)
RDW: 14.2 % (ref 11.5–15.5)
WBC: 7.1 10*3/uL (ref 4.0–10.5)
nRBC: 0 % (ref 0.0–0.2)

## 2022-01-18 LAB — RESP PANEL BY RT-PCR (RSV, FLU A&B, COVID)  RVPGX2
Influenza A by PCR: NEGATIVE
Influenza B by PCR: NEGATIVE
Resp Syncytial Virus by PCR: NEGATIVE
SARS Coronavirus 2 by RT PCR: POSITIVE — AB

## 2022-01-18 LAB — LIPASE, BLOOD: Lipase: 29 U/L (ref 11–51)

## 2022-01-18 MED ORDER — IBUPROFEN 400 MG PO TABS
400.0000 mg | ORAL_TABLET | Freq: Once | ORAL | Status: AC
  Administered 2022-01-18: 400 mg via ORAL
  Filled 2022-01-18: qty 1

## 2022-01-18 MED ORDER — ONDANSETRON 4 MG PO TBDP
4.0000 mg | ORAL_TABLET | Freq: Once | ORAL | Status: AC
  Administered 2022-01-18: 4 mg via ORAL
  Filled 2022-01-18: qty 1

## 2022-01-18 MED ORDER — ONDANSETRON 4 MG PO TBDP: 4.0000 mg | ORAL_TABLET | Freq: Three times a day (TID) | ORAL | 0 refills | Status: DC | PRN

## 2022-01-18 MED ORDER — NIRMATRELVIR/RITONAVIR (PAXLOVID)TABLET: 3.0000 | ORAL_TABLET | Freq: Two times a day (BID) | ORAL | 0 refills | Status: AC

## 2022-01-18 NOTE — ED Provider Triage Note (Signed)
Emergency Medicine Provider Triage Evaluation Note  [name] , a 31 y.o. female  was evaluated in triage.  Pt complains of URI-like symptoms, started about 2 days ago, since being discharged from the behavioral health care unit, states she has general body aches, nausea vomiting, states she has a cough and congestion, denies any suicidal homicidal ideations..  Review of Systems  Positive: Cough, general body aches Negative: Chest pain shortness of breath  Physical Exam  BP 117/86 (BP Location: Right Arm)   Pulse (!) 117   Temp 99.1 F (37.3 C) (Oral)   Resp (!) 21   SpO2 97%  Gen:   Awake, no distress   Resp:  Normal effort  MSK:   Moves extremities without difficulty  Other:    Medical Decision Making  Medically screening exam initiated at 2:54 AM.  Appropriate orders placed.  [name] was informed that the remainder of the evaluation will be completed by another provider, this initial triage assessment does not replace that evaluation, and the importance of remaining in the ED until their evaluation is complete.  Lab work imaging been ordered will need further workup.   Marcello Fennel, PA-C 01/18/22 [phone]

## 2022-01-18 NOTE — ED Provider Notes (Signed)
[facility] EMERGENCY DEPARTMENT Provider Note   CSN: [id number] Arrival date & time: 01/18/22  1696     History  Chief Complaint  Patient presents with   Cough    [name] is a 31 y.o. female.  Patient here with cough and bodyaches for the 2 to 3 days.  She just discharged from behavioral health unit.  Nothing makes it worse or better.  She denies any chest pain or shortness of breath or abdominal pain or nausea or vomiting currently but has had some nausea.  The history is provided by the patient.       Home Medications Prior to Admission medications   Medication Sig Start Date End Date Taking? Authorizing Provider  nirmatrelvir/ritonavir EUA (PAXLOVID) 20 x 150 MG & 10 x '100MG'$  TABS Take 3 tablets by mouth 2 (two) times daily for 5 days. Patient GFR is >60 . Take nirmatrelvir (150 mg) two tablets twice daily for 5 days and ritonavir (100 mg) one tablet twice daily for 5 days. 01/18/22 01/23/22 Yes Isami Mehra, DO  albuterol (VENTOLIN HFA) 108 (90 Base) MCG/ACT inhaler Inhale 2 puffs into the lungs every 6 (six) hours as needed for wheezing or shortness of breath.    [provider]  Asenapine (SECUADO) 3.8 MG/24HR PT24 Place 3.8 mg onto the skin daily for 7 days. 01/23/22 01/30/22  Massengill, Ovid Curd, MD  budesonide-formoterol (SYMBICORT) 160-4.5 MCG/ACT inhaler Inhale 2 puffs into the lungs 2 (two) times daily.    [provider]  melatonin 3 MG TABS tablet Take 1 tablet (3 mg total) by mouth at bedtime for 7 days. 01/23/22 01/30/22  Massengill, Ovid Curd, MD  metoprolol succinate (TOPROL-XL) 25 MG 24 hr tablet Take 0.5 tablets (12.5 mg total) by mouth daily for 7 doses. 01/23/22 01/30/22  Massengill, Ovid Curd, MD  ondansetron (ZOFRAN-ODT) 4 MG disintegrating tablet Take 1 tablet (4 mg total) by mouth every 8 (eight) hours as needed for nausea or vomiting. 01/18/22   Kashae Carstens, DO      Allergies    Bee venom, Coconut  flavor, Fish allergy, Geodon [ziprasidone hcl], Haloperidol and related, Lithobid [lithium], Roxicodone [oxycodone], Seroquel [quetiapine], Shellfish allergy, Phenergan [promethazine hcl], Prilosec [omeprazole], Sulfa antibiotics, Tegretol [carbamazepine], Prozac [fluoxetine], Tape, and Tylenol [acetaminophen]    Review of Systems   Review of Systems  Physical Exam Updated Vital Signs  ED Triage Vitals  Enc Vitals Group     BP 01/18/22 0252 117/86     Pulse Rate 01/18/22 0252 (!) 117     Resp 01/18/22 0252 (!) 21     Temp 01/18/22 0252 99.1 F (37.3 C)     Temp Source 01/18/22 0252 Oral     SpO2 01/18/22 0252 97 %     Weight --      Height --      Head Circumference --      Peak Flow --      Pain Score 01/18/22 0230 0     Pain Loc --      Pain Edu? --      Excl. in [place]? --     Physical Exam Vitals and nursing note reviewed.  Constitutional:      General: She is not in acute distress.    Appearance: She is well-developed.  HENT:     Head: Normocephalic and atraumatic.     Nose: Nose normal.     Mouth/Throat:     Mouth: Mucous membranes are moist.  Eyes:  Conjunctiva/sclera: Conjunctivae normal.     Pupils: Pupils are equal, round, and reactive to light.  Cardiovascular:     Rate and Rhythm: Normal rate and regular rhythm.     Pulses: Normal pulses.     Heart sounds: Normal heart sounds. No murmur heard. Pulmonary:     Effort: Pulmonary effort is normal. No respiratory distress.     Breath sounds: Normal breath sounds.  Abdominal:     Palpations: Abdomen is soft.     Tenderness: There is no abdominal tenderness.  Musculoskeletal:        General: No swelling.     Cervical back: Neck supple.  Skin:    General: Skin is warm and dry.     Capillary Refill: Capillary refill takes less than 2 seconds.  Neurological:     Mental Status: She is alert.  Psychiatric:        Mood and Affect: Mood normal.     ED Results / Procedures / Treatments   Labs (all labs  ordered are listed, but only abnormal results are displayed) Labs Reviewed  RESP PANEL BY RT-PCR (RSV, FLU A&B, COVID)  RVPGX2 - Abnormal; Notable for the following components:      Result Value   SARS Coronavirus 2 by RT PCR POSITIVE (*)    All other components within normal limits  CBC WITH DIFFERENTIAL/PLATELET - Abnormal; Notable for the following components:   Hemoglobin 11.6 (*)    MCH 25.3 (*)    All other components within normal limits  COMPREHENSIVE METABOLIC PANEL - Abnormal; Notable for the following components:   CO2 21 (*)    Glucose, Bld 103 (*)    All other components within normal limits  LIPASE, BLOOD    EKG None  Radiology DG Chest 2 View  Result Date: 01/18/2022 CLINICAL DATA:  Cough EXAM: CHEST - 2 VIEW COMPARISON:  12/12/2021 FINDINGS: Cardiac shadows within normal limits. Lungs are well aerated bilaterally. No focal infiltrate is noted. No bony abnormality is seen. IMPRESSION: No active cardiopulmonary disease. Electronically Signed   By: Inez Catalina M.D.   On: 01/18/2022 03:18    Procedures Procedures    Medications Ordered in ED Medications  ibuprofen (ADVIL) tablet 400 mg (400 mg Oral Given 01/18/22 1234)  ondansetron (ZOFRAN-ODT) disintegrating tablet 4 mg (4 mg Oral Given 01/18/22 1234)    ED Course/ Medical Decision Making/ A&P                           Medical Decision Making Risk Prescription drug management.   [name] is here with flulike symptoms.  Positive for COVID.  Well-appearing.  Otherwise there is no signs of dehydration or electrolyte abnormality or anemia on review and interpretation of her labs.  Chest x-ray showed no evidence of pneumonia per my review and interpretation of labs.  Overall she appears well.  Given Motrin and Zofran.  Will prescribe Paxillovid and Zofran.  Discharged in good condition.  This chart was dictated using voice recognition software.  Despite best efforts to proofread,  errors can occur  which can change the documentation meaning.         Final Clinical Impression(s) / ED Diagnoses Final diagnoses:  COVID-19    Rx / DC Orders ED Discharge Orders          Ordered    nirmatrelvir/ritonavir EUA (PAXLOVID) 20 x 150 MG & 10 x '100MG'$  TABS  2 times daily  01/18/22 1228    ondansetron (ZOFRAN-ODT) 4 MG disintegrating tablet  Every 8 hours PRN,   Status:  Discontinued        01/18/22 1229    ondansetron (ZOFRAN-ODT) 4 MG disintegrating tablet  Every 8 hours PRN        01/18/22 1233              Lennice Sites, DO 01/18/22 1245

## 2022-01-21 ENCOUNTER — Emergency Department (HOSPITAL_COMMUNITY): Payer: PPO

## 2022-01-21 ENCOUNTER — Other Ambulatory Visit: Payer: Self-pay

## 2022-01-21 ENCOUNTER — Emergency Department (HOSPITAL_COMMUNITY)
Admission: EM | Admit: 2022-01-21 | Discharge: 2022-01-22 | Discharge status: Against Medical Advice | Payer: PPO | Attending: Emergency Medicine | Admitting: Emergency Medicine

## 2022-01-21 DIAGNOSIS — R911 Solitary pulmonary nodule: Secondary | ICD-10-CM | POA: Diagnosis not present

## 2022-01-21 DIAGNOSIS — R110 Nausea: Secondary | ICD-10-CM | POA: Diagnosis not present

## 2022-01-21 DIAGNOSIS — R6889 Other general symptoms and signs: Secondary | ICD-10-CM | POA: Diagnosis not present

## 2022-01-21 DIAGNOSIS — Z5321 Procedure and treatment not carried out due to patient leaving prior to being seen by health care provider: Secondary | ICD-10-CM | POA: Diagnosis not present

## 2022-01-21 DIAGNOSIS — J9811 Atelectasis: Secondary | ICD-10-CM | POA: Diagnosis not present

## 2022-01-21 DIAGNOSIS — R202 Paresthesia of skin: Secondary | ICD-10-CM | POA: Diagnosis not present

## 2022-01-21 DIAGNOSIS — R0602 Shortness of breath: Secondary | ICD-10-CM | POA: Diagnosis not present

## 2022-01-21 DIAGNOSIS — R6884 Jaw pain: Secondary | ICD-10-CM | POA: Diagnosis not present

## 2022-01-21 DIAGNOSIS — R0789 Other chest pain: Secondary | ICD-10-CM | POA: Diagnosis not present

## 2022-01-21 DIAGNOSIS — R079 Chest pain, unspecified: Secondary | ICD-10-CM | POA: Diagnosis not present

## 2022-01-21 LAB — BASIC METABOLIC PANEL
Anion gap: 11 (ref 5–15)
BUN: 7 mg/dL (ref 6–20)
CO2: 19 mmol/L — ABNORMAL LOW (ref 22–32)
Calcium: 9.2 mg/dL (ref 8.9–10.3)
Chloride: 110 mmol/L (ref 98–111)
Creatinine, Ser: 0.6 mg/dL (ref 0.44–1.00)
GFR, Estimated: >60 mL/min (ref >60)
Glucose, Bld: 98 mg/dL (ref 70–99)
Potassium: 3.9 mmol/L (ref 3.5–5.1)
Sodium: 140 mmol/L (ref 135–145)

## 2022-01-21 LAB — CBC
HCT: 40.9 % (ref 36.0–46.0)
Hemoglobin: 12.7 g/dL (ref 12.0–15.0)
MCH: 25.7 pg — ABNORMAL LOW (ref 26.0–34.0)
MCHC: 31.1 g/dL (ref 30.0–36.0)
MCV: 82.8 fL (ref 80.0–100.0)
Platelets: 328 10*3/uL (ref 150–400)
RBC: 4.94 MIL/uL (ref 3.87–5.11)
RDW: 13.7 % (ref 11.5–15.5)
WBC: 6.7 10*3/uL (ref 4.0–10.5)
nRBC: 0 % (ref 0.0–0.2)

## 2022-01-21 LAB — TROPONIN I (HIGH SENSITIVITY): Troponin I (High Sensitivity): <2 ng/L (ref <18)

## 2022-01-21 LAB — I-STAT BETA HCG BLOOD, ED (MC, WL, AP ONLY): I-stat hCG, quantitative: <5 m[IU]/mL (ref <5)

## 2022-01-21 MED ORDER — NITROGLYCERIN 0.4 MG SL SUBL: 0.4000 mg | SUBLINGUAL_TABLET | SUBLINGUAL | Status: DC | PRN

## 2022-01-21 NOTE — ED Provider Triage Note (Signed)
Emergency Medicine Provider Triage Evaluation Note  [name] , a 31 y.o. female  was evaluated in triage.  Pt complains of chest pain that began earlier this evening associated with some left-sided jaw pain, arm tingling.  Patient reports 2 heart attacks this year, reports she had many previous attempted overdose with prescription medications and causing permanent damage to her heart.  Patient denies current shortness of breath, fever, chills, she has recently been dealing with COVID.  Encouraged to come in by her cardiologist..  Review of Systems  Positive: Chest pain, jaw pain, nausea Negative: Shob, fever  Physical Exam  BP 111/60 (BP Location: Right Arm)   Pulse 93   Temp 98.7 F (37.1 C)   Resp 17   SpO2 100%  Gen:   Awake, no distress   Resp:  Normal effort  MSK:   Moves extremities without difficulty  Other:    Medical Decision Making  Medically screening exam initiated at 9:29 PM.  Appropriate orders placed.  [name] was informed that the remainder of the evaluation will be completed by another provider, this initial triage assessment does not replace that evaluation, and the importance of remaining in the ED until their evaluation is complete.  Workup initiated   Anselmo Pickler, [place] 01/21/22 2131

## 2022-01-21 NOTE — ED Triage Notes (Signed)
Patient reports left chest pain with mild SOB onset yesterday , no emesis or diaphoresis , patient stated recent changes on her prescription medications . Denies cough or fever .

## 2022-01-22 ENCOUNTER — Emergency Department (HOSPITAL_COMMUNITY): Payer: PPO

## 2022-01-22 DIAGNOSIS — J9811 Atelectasis: Secondary | ICD-10-CM | POA: Diagnosis not present

## 2022-01-22 DIAGNOSIS — R911 Solitary pulmonary nodule: Secondary | ICD-10-CM | POA: Diagnosis not present

## 2022-01-22 DIAGNOSIS — R079 Chest pain, unspecified: Secondary | ICD-10-CM | POA: Diagnosis not present

## 2022-01-22 LAB — TROPONIN I (HIGH SENSITIVITY): Troponin I (High Sensitivity): <2 ng/L (ref <18)

## 2022-01-22 MED ORDER — IOHEXOL 350 MG/ML SOLN
75.0000 mL | Freq: Once | INTRAVENOUS | Status: AC | PRN
  Administered 2022-01-22: 75 mL via INTRAVENOUS

## 2022-01-22 NOTE — ED Notes (Signed)
Patient left on own accord °

## 2022-01-22 NOTE — ED Provider Triage Note (Signed)
  Emergency Medicine Provider Triage Evaluation Note  Patient reassessed.  Still having pain.  Initial trop negative. Sent by cardiologist.  Will add CT chest.    Montine Circle, PA-C 01/22/22 0303

## 2022-01-23 DIAGNOSIS — F1020 Alcohol dependence, uncomplicated: Secondary | ICD-10-CM | POA: Diagnosis not present

## 2022-01-23 DIAGNOSIS — F4310 Post-traumatic stress disorder, unspecified: Secondary | ICD-10-CM | POA: Diagnosis not present

## 2022-01-23 DIAGNOSIS — F603 Borderline personality disorder: Secondary | ICD-10-CM | POA: Diagnosis not present

## 2022-01-23 DIAGNOSIS — J189 Pneumonia, unspecified organism: Secondary | ICD-10-CM | POA: Diagnosis not present

## 2022-01-23 DIAGNOSIS — F250 Schizoaffective disorder, bipolar type: Secondary | ICD-10-CM | POA: Diagnosis not present

## 2022-01-30 ENCOUNTER — Other Ambulatory Visit: Payer: Self-pay

## 2022-01-30 ENCOUNTER — Ambulatory Visit (HOSPITAL_COMMUNITY)
Admission: EM | Admit: 2022-01-30 | Discharge: 2022-01-31 | Disposition: A | Discharge status: Other Acute Inpatient Hospital | Payer: PPO | Attending: Nurse Practitioner | Admitting: Nurse Practitioner

## 2022-01-30 DIAGNOSIS — F250 Schizoaffective disorder, bipolar type: Secondary | ICD-10-CM | POA: Diagnosis not present

## 2022-01-30 DIAGNOSIS — R45851 Suicidal ideations: Secondary | ICD-10-CM | POA: Diagnosis not present

## 2022-01-30 DIAGNOSIS — Z9151 Personal history of suicidal behavior: Secondary | ICD-10-CM | POA: Insufficient documentation

## 2022-01-30 DIAGNOSIS — Z1152 Encounter for screening for COVID-19: Secondary | ICD-10-CM | POA: Insufficient documentation

## 2022-01-30 DIAGNOSIS — F603 Borderline personality disorder: Secondary | ICD-10-CM | POA: Diagnosis not present

## 2022-01-30 DIAGNOSIS — Z20822 Contact with and (suspected) exposure to covid-19: Secondary | ICD-10-CM | POA: Diagnosis not present

## 2022-01-30 DIAGNOSIS — M797 Fibromyalgia: Secondary | ICD-10-CM | POA: Diagnosis not present

## 2022-01-30 DIAGNOSIS — K219 Gastro-esophageal reflux disease without esophagitis: Secondary | ICD-10-CM | POA: Insufficient documentation

## 2022-01-30 DIAGNOSIS — F4310 Post-traumatic stress disorder, unspecified: Secondary | ICD-10-CM | POA: Insufficient documentation

## 2022-01-30 DIAGNOSIS — F4323 Adjustment disorder with mixed anxiety and depressed mood: Secondary | ICD-10-CM | POA: Diagnosis not present

## 2022-01-30 LAB — POCT URINE DRUG SCREEN - MANUAL ENTRY (I-SCREEN)
POC Amphetamine UR: NOT DETECTED
POC Buprenorphine (BUP): NOT DETECTED
POC Cocaine UR: NOT DETECTED
POC Marijuana UR: NOT DETECTED
POC Methadone UR: NOT DETECTED
POC Methamphetamine UR: NOT DETECTED
POC Morphine: NOT DETECTED
POC Oxazepam (BZO): NOT DETECTED
POC Oxycodone UR: NOT DETECTED
POC Secobarbital (BAR): NOT DETECTED

## 2022-01-30 LAB — POC SARS CORONAVIRUS 2 AG: SARSCOV2ONAVIRUS 2 AG: NEGATIVE

## 2022-01-30 LAB — POC URINE PREG, ED: Preg Test, Ur: NEGATIVE

## 2022-01-30 MED ORDER — HYDROXYZINE HCL 25 MG PO TABS
25.0000 mg | ORAL_TABLET | Freq: Three times a day (TID) | ORAL | Status: DC | PRN
  Administered 2022-01-30: 25 mg via ORAL
  Filled 2022-01-30: qty 1

## 2022-01-30 MED ORDER — METOPROLOL SUCCINATE ER 25 MG PO TB24
12.5000 mg | ORAL_TABLET | Freq: Every day | ORAL | Status: DC
  Administered 2022-01-31: 12.5 mg via ORAL
  Filled 2022-01-30: qty 0.5

## 2022-01-30 MED ORDER — TRAZODONE HCL 50 MG PO TABS: 50.0000 mg | ORAL_TABLET | Freq: Every evening | ORAL | Status: DC | PRN

## 2022-01-30 MED ORDER — ALBUTEROL SULFATE HFA 108 (90 BASE) MCG/ACT IN AERS: 2.0000 | INHALATION_SPRAY | Freq: Four times a day (QID) | RESPIRATORY_TRACT | Status: DC | PRN

## 2022-01-30 MED ORDER — MAGNESIUM HYDROXIDE 400 MG/5ML PO SUSP: 30.0000 mL | Freq: Every day | ORAL | Status: DC | PRN

## 2022-01-30 MED ORDER — METOPROLOL SUCCINATE ER 25 MG PO TB24: 12.5000 mg | ORAL_TABLET | Freq: Once | ORAL | Status: DC

## 2022-01-30 MED ORDER — MELATONIN 3 MG PO TABS
3.0000 mg | ORAL_TABLET | Freq: Every evening | ORAL | Status: DC | PRN
  Administered 2022-01-30: 3 mg via ORAL
  Filled 2022-01-30: qty 1

## 2022-01-30 MED ORDER — ALUM & MAG HYDROXIDE-SIMETH 200-200-20 MG/5ML PO SUSP: 30.0000 mL | ORAL | Status: DC | PRN

## 2022-01-30 NOTE — ED Provider Notes (Signed)
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 145 mmol/L Final   Potassium 12/28/2021 3.8  3.5 - 5.1 mmol/L Final   Chloride 12/28/2021 110  98 - 111 mmol/L Final   BUN 12/28/2021 <3 (L)  6 - 20 mg/dL Final   Creatinine, Ser 12/28/2021 0.60  0.44  - 1.00 mg/dL Final   Glucose, Bld 12/28/2021 91  70 - 99 mg/dL Final   Glucose reference range applies only to samples taken after fasting for at least 8 hours.   Calcium, Ion 12/28/2021 1.12 (L)  1.15 - 1.40 mmol/L Final   TCO2 12/28/2021 21 (L)  22 - 32 mmol/L Final   Hemoglobin 12/28/2021 10.5 (L)  12.0 - 15.0 g/dL Final   HCT 12/28/2021 31.0 (L)  36.0 - 46.0 % Final   I-stat hCG, quantitative 12/28/2021 <5.0  <5 mIU/mL Final   Comment 3 12/28/2021          Final   Comment:   GEST. AGE      CONC.  (mIU/mL)   <=1 WEEK        5 - 50     2 WEEKS       50 - 500     3 WEEKS  100 - 10,000     4 WEEKS     1,000 - 30,000        FEMALE AND NON-PREGNANT FEMALE:     LESS THAN 5 mIU/mL    Sodium 12/28/2021 141  135 - 145 mmol/L Final   Potassium 12/28/2021 3.7  3.5 - 5.1 mmol/L Final   Chloride 12/28/2021 112 (H)  98 - 111 mmol/L Final   CO2 12/28/2021 21 (L)  22 - 32 mmol/L Final   Glucose, Bld 12/28/2021 93  70 - 99 mg/dL Final   Glucose reference range applies only to samples taken after fasting for at least 8 hours.   BUN 12/28/2021 <5 (L)  6 - 20 mg/dL Final   Creatinine, Ser 12/28/2021 0.70  0.44 - 1.00 mg/dL Final   Calcium 12/28/2021 8.1 (L)  8.9 - 10.3 mg/dL Final   Total Protein 12/28/2021 5.6 (L)  6.5 - 8.1 g/dL Final   Albumin 12/28/2021 3.2 (L)  3.5 - 5.0 g/dL Final   AST 12/28/2021 15  15 - 41 U/L Final   ALT 12/28/2021 12  0 - 44 U/L Final   Alkaline Phosphatase 12/28/2021 56  38 - 126 U/L Final   Total Bilirubin 12/28/2021 0.3  0.3 - 1.2 mg/dL Final   GFR, Estimated 12/28/2021 >60  >60 mL/min Final   Comment: (NOTE) Calculated using the CKD-EPI Creatinine Equation (2021)    Anion gap 12/28/2021 8  5 - 15 Final   Performed at [facility] [address], [place], [place] [address]   WBC 12/28/2021 6.8  4.0 - 10.5 K/uL Final   RBC 12/28/2021 4.16  3.87 - 5.11 MIL/uL Final   Hemoglobin 12/28/2021 10.6 (L)  12.0 - 15.0 g/dL Final   HCT 12/28/2021 34.7 (L)  36.0  - 46.0 % Final   MCV 12/28/2021 83.4  80.0 - 100.0 fL Final   MCH 12/28/2021 25.5 (L)  26.0 - 34.0 pg Final   MCHC 12/28/2021 30.5  30.0 - 36.0 g/dL Final   RDW 12/28/2021 13.5  11.5 - 15.5 % Final   Platelets 12/28/2021 142 (L)  150 - 400 K/uL Final   REPEATED TO VERIFY   nRBC 12/28/2021 0.0  0.0 - 0.2 % Final   Neutrophils Relative % 12/28/2021 52  % Final   Neutro Abs 12/28/2021 3.5  1.7 - 7.7 K/uL Final   Lymphocytes Relative 12/28/2021 41  % Final   Lymphs Abs 12/28/2021 2.8  0.7 - 4.0 K/uL Final   Monocytes Relative 12/28/2021 6  % Final   Monocytes Absolute 12/28/2021 0.4  0.1 - 1.0 K/uL Final   Eosinophils Relative 12/28/2021 1  % Final   Eosinophils Absolute 12/28/2021 0.0  0.0 - 0.5 K/uL Final   Basophils Relative 12/28/2021 0  % Final   Basophils Absolute 12/28/2021 0.0  0.0 - 0.1 K/uL Final   Immature Granulocytes 12/28/2021 0  % Final   Abs Immature Granulocytes 12/28/2021 0.02  0.00 - 0.07 K/uL Final   Performed at [facility], [place] [address] [place], [place] [address]   Color, Urine 12/28/2021 YELLOW  YELLOW Final   APPearance 12/28/2021 CLEAR  CLEAR Final   Specific Gravity, Urine 12/28/2021 1.006  1.005 - 1.030 Final   pH 12/28/2021 8.0  5.0 - 8.0 Final   Glucose, UA 12/28/2021 NEGATIVE  NEGATIVE mg/dL Final   Hgb urine dipstick 12/28/2021 NEGATIVE  NEGATIVE Final   Bilirubin Urine 12/28/2021 NEGATIVE  NEGATIVE Final   Ketones,  ur 12/28/2021 NEGATIVE  NEGATIVE mg/dL Final   Protein, ur 12/28/2021 NEGATIVE  NEGATIVE mg/dL Final   Nitrite 12/28/2021 NEGATIVE  NEGATIVE Final   Leukocytes,Ua 12/28/2021 SMALL (A)  NEGATIVE Final   RBC / HPF 12/28/2021 0-5  0 - 5 RBC/hpf Final   WBC, UA 12/28/2021 0-5  0 - 5 WBC/hpf Final   Bacteria, UA 12/28/2021 RARE (A)  NONE SEEN Final   Squamous Epithelial / LPF 12/28/2021 6-10  0 - 5 Final   Performed at [facility], [place] [address] [place], [place] [address]   Salicylate Lvl 40/09/6759 <7.0 (L)  7.0 - 30.0 mg/dL  Final   Performed at [facility] [address] [place], [place] [address]   Acetaminophen (Tylenol), Serum 12/28/2021 <10 (L)  10 - 30 ug/mL Final   Comment: (NOTE) Therapeutic concentrations vary significantly. A range of 10-30 ug/mL  may be an effective concentration for many patients. However, some  are best treated at concentrations outside of this range. Acetaminophen concentrations >150 ug/mL at 4 hours after ingestion  and >50 ug/mL at 12 hours after ingestion are often associated with  toxic reactions.  Performed at [facility], [place] [address] [place], [place] [address]    Opiates 12/28/2021 NONE DETECTED  NONE DETECTED Final   Cocaine 12/28/2021 NONE DETECTED  NONE DETECTED Final   Benzodiazepines 12/28/2021 NONE DETECTED  NONE DETECTED Final   Amphetamines 12/28/2021 NONE DETECTED  NONE DETECTED Final   Tetrahydrocannabinol 12/28/2021 NONE DETECTED  NONE DETECTED Final   Barbiturates 12/28/2021 NONE DETECTED  NONE DETECTED Final   Comment: (NOTE) DRUG SCREEN FOR MEDICAL PURPOSES ONLY.  IF CONFIRMATION IS NEEDED FOR ANY PURPOSE, NOTIFY LAB WITHIN 5 DAYS.  LOWEST DETECTABLE LIMITS FOR URINE DRUG SCREEN Drug Class                     Cutoff (ng/mL) Amphetamine and metabolites    1000 Barbiturate and metabolites    200 Benzodiazepine                 200 Opiates and metabolites        300 Cocaine and metabolites        300 THC                            50 Performed at [facility], [place] [address] [place], [place] [address]    WBC 12/28/2021 8.5  4.0 - 10.5 K/uL Final   RBC 12/28/2021 4.31  3.87 - 5.11 MIL/uL Final   Hemoglobin 12/28/2021 11.2 (L)  12.0 - 15.0 g/dL Final   HCT 12/28/2021 36.0  36.0 - 46.0 % Final   MCV 12/28/2021 83.5  80.0 - 100.0 fL Final   MCH 12/28/2021 26.0  26.0 - 34.0 pg Final   MCHC 12/28/2021 31.1  30.0 - 36.0 g/dL Final   RDW 12/28/2021 13.6  11.5 - 15.5 % Final   Platelets 12/28/2021 262  150 - 400 K/uL Final    nRBC 12/28/2021 0.0  0.0 - 0.2 % Final   Performed at [place] [address], [place], [place] [address]   Sodium 12/28/2021 141  135 - 145 mmol/L Final   Potassium 12/28/2021 3.9  3.5 - 5.1 mmol/L Final   Chloride 12/28/2021 110  98 - 111 mmol/L Final   CO2 12/28/2021 19 (L)  22 - 32 mmol/L Final  Glucose, Bld 12/28/2021 103 (H)  70 - 99 mg/dL Final   Glucose reference range applies only to samples taken after fasting for at least 8 hours.   BUN 12/28/2021 <5 (L)  6 - 20 mg/dL Final   Creatinine, Ser 12/28/2021 0.73  0.44 - 1.00 mg/dL Final   Calcium 12/28/2021 8.1 (L)  8.9 - 10.3 mg/dL Final   GFR, Estimated 12/28/2021 >60  >60 mL/min Final   Comment: (NOTE) Calculated using the CKD-EPI Creatinine Equation (2021)    Anion gap 12/28/2021 12  5 - 15 Final   Performed at [facility], [place] [address], [place], [place] [address]   Magnesium 12/28/2021 1.8  1.7 - 2.4 mg/dL Final   Performed at [facility] [address] [place], [place] [address]   Phosphorus 12/28/2021 3.1  2.5 - 4.6 mg/dL Final   Performed at [facility] [address], [place], [place] [address]   Total Protein 12/28/2021 5.9 (L)  6.5 - 8.1 g/dL Final   Albumin 12/28/2021 3.3 (L)  3.5 - 5.0 g/dL Final   AST 12/28/2021 17  15 - 41 U/L Final   ALT 12/28/2021 11  0 - 44 U/L Final   Alkaline Phosphatase 12/28/2021 57  38 - 126 U/L Final   Total Bilirubin 12/28/2021 0.3  0.3 - 1.2 mg/dL Final   Bilirubin, Direct 12/28/2021 <0.1  0.0 - 0.2 mg/dL Final   Indirect Bilirubin 12/28/2021 NOT CALCULATED  0.3 - 0.9 mg/dL Final   Performed at [facility] [address], [place], [place] [address]   MRSA by PCR Next Gen 12/28/2021 NOT DETECTED  NOT DETECTED Final   Comment: (NOTE) The GeneXpert MRSA Assay (FDA approved for NASAL specimens only), is one component of a comprehensive MRSA colonization surveillance program. It is not intended to diagnose MRSA infection nor to guide or  monitor treatment for MRSA infections. Test performance is not FDA approved in patients less than 63 years old. Performed at [facility], [place] [address] [place], [place] [address]    WBC 12/30/2021 10.9 (H)  4.0 - 10.5 K/uL Final   RBC 12/30/2021 5.01  3.87 - 5.11 MIL/uL Final   Hemoglobin 12/30/2021 13.1  12.0 - 15.0 g/dL Final   HCT 12/30/2021 40.1  36.0 - 46.0 % Final   MCV 12/30/2021 80.0  80.0 - 100.0 fL Final   MCH 12/30/2021 26.1  26.0 - 34.0 pg Final   MCHC 12/30/2021 32.7  30.0 - 36.0 g/dL Final   RDW 12/30/2021 13.9  11.5 - 15.5 % Final   Platelets 12/30/2021 254  150 - 400 K/uL Final   nRBC 12/30/2021 0.0  0.0 - 0.2 % Final   Performed at [facility] [address], [place], [place] [address]   Sodium 12/30/2021 136  135 - 145 mmol/L Final   Potassium 12/30/2021 3.6  3.5 - 5.1 mmol/L Final   Chloride 12/30/2021 105  98 - 111 mmol/L Final   CO2 12/30/2021 18 (L)  22 - 32 mmol/L Final   Glucose, Bld 12/30/2021 106 (H)  70 - 99 mg/dL Final   Glucose reference range applies only to samples taken after fasting for at least 8 hours.   BUN 12/30/2021 10  6 - 20 mg/dL Final   Creatinine, Ser 12/30/2021 0.70  0.44 - 1.00 mg/dL Final   Calcium 12/30/2021 9.4  8.9 - 10.3 mg/dL Final   GFR, Estimated 12/30/2021 >60  >  60 mL/min Final   Comment: (NOTE) Calculated using the CKD-EPI Creatinine Equation (2021)    Anion gap 12/30/2021 13  5 - 15 Final   Performed at [facility], [place] [address], [place], [place] [address]   Magnesium 12/30/2021 1.9  1.7 - 2.4 mg/dL Final   Performed at [facility] [address] [place], [place] [address]   Phosphorus 12/30/2021 4.1  2.5 - 4.6 mg/dL Final   Performed at [facility] [address], [place], [place] [address]   Total Protein 12/30/2021 7.7  6.5 - 8.1 g/dL Final   Albumin 12/30/2021 4.3  3.5 - 5.0 g/dL Final   AST 12/30/2021 19  15 - 41 U/L Final   ALT 12/30/2021 11  0 - 44 U/L Final   Alkaline  Phosphatase 12/30/2021 65  38 - 126 U/L Final   Total Bilirubin 12/30/2021 0.3  0.3 - 1.2 mg/dL Final   Bilirubin, Direct 12/30/2021 <0.1  0.0 - 0.2 mg/dL Final   Indirect Bilirubin 12/30/2021 NOT CALCULATED  0.3 - 0.9 mg/dL Final   Performed at [facility], [place] [address] [place], [place] [address]   SARS Coronavirus 2 by RT PCR 12/30/2021 NEGATIVE  NEGATIVE Final   Comment: (NOTE) SARS-CoV-2 target nucleic acids are NOT DETECTED.  The SARS-CoV-2 RNA is generally detectable in upper and lower respiratory specimens during the acute phase of infection. The lowest concentration of SARS-CoV-2 viral copies this assay can detect is 250 copies / mL. A negative result does not preclude SARS-CoV-2 infection and should not be used as the sole basis for treatment or other patient management decisions.  A negative result may occur with improper specimen collection / handling, submission of specimen other than nasopharyngeal swab, presence of viral mutation(s) within the areas targeted by this assay, and inadequate number of viral copies (<250 copies / mL). A negative result must be combined with clinical observations, patient history, and epidemiological information.  Fact Sheet for Patients:   https://www.patel.info/  Fact Sheet for Healthcare Providers: https://hall.com/  This test is not yet approved or                           cleared by the [place] FDA and has been authorized for detection and/or diagnosis of SARS-CoV-2 by FDA under an Emergency Use Authorization (EUA).  This EUA will remain in effect (meaning this test can be used) for the duration of the COVID-19 declaration under Section 564(b)(1) of the Act, 21 U.S.C. section 360bbb-3(b)(1), unless the authorization is terminated or revoked sooner.  Performed at [facility], [place] [address], [place], [place] [address]    WBC 01/01/2022 8.7  4.0 - 10.5 K/uL Final    RBC 01/01/2022 4.72  3.87 - 5.11 MIL/uL Final   Hemoglobin 01/01/2022 12.1  12.0 - 15.0 g/dL Final   HCT 01/01/2022 37.6  36.0 - 46.0 % Final   MCV 01/01/2022 79.7 (L)  80.0 - 100.0 fL Final   MCH 01/01/2022 25.6 (L)  26.0 - 34.0 pg Final   MCHC 01/01/2022 32.2  30.0 - 36.0 g/dL Final   RDW 01/01/2022 13.8  11.5 - 15.5 % Final   Platelets 01/01/2022 271  150 - 400 K/uL Final   nRBC 01/01/2022 0.0  0.0 - 0.2 % Final   Performed at [facility] [address], [place],  [address]   Sodium 01/01/2022 135  135 - 145 mmol/L Final   Potassium 01/01/2022 3.4 (L)  3.5 - 5.1 mmol/L Final   Chloride 01/01/2022 104  98 - 111 mmol/L Final   CO2 01/01/2022 21 (L)  22 - 32 mmol/L Final   Glucose, Bld 01/01/2022 106 (H)  70 - 99 mg/dL Final   Glucose reference range applies only to samples taken after fasting for at least 8 hours.   BUN 01/01/2022 10  6 - 20 mg/dL Final   Creatinine, Ser 01/01/2022 0.70  0.44 - 1.00 mg/dL Final   Calcium 01/01/2022 9.2  8.9 - 10.3 mg/dL Final   GFR, Estimated 01/01/2022 >60  >60 mL/min Final   Comment: (NOTE) Calculated using the CKD-EPI Creatinine Equation (2021)    Anion gap 01/01/2022 10  5 - 15 Final   Performed at [facility], [place] [address], [place], [place] [address]   Magnesium 01/01/2022 2.0  1.7 - 2.4 mg/dL Final   Performed at [facility] [address] [place], [place] [address]   Phosphorus 01/01/2022 3.8  2.5 - 4.6 mg/dL Final   Performed at [facility] [address] [place], [place] [address]   Total Protein 01/01/2022 7.5  6.5 - 8.1 g/dL Final   Albumin 01/01/2022 4.0  3.5 - 5.0 g/dL Final   AST 01/01/2022 18  15 - 41 U/L Final   ALT 01/01/2022 14  0 - 44 U/L Final   Alkaline Phosphatase 01/01/2022 57  38 - 126 U/L Final   Total Bilirubin 01/01/2022 0.7  0.3 - 1.2 mg/dL Final   Bilirubin, Direct 01/01/2022 <0.1  0.0 - 0.2 mg/dL Final   Indirect Bilirubin 01/01/2022 NOT CALCULATED  0.3 - 0.9 mg/dL Final    Performed at [facility], [place] [address] [place], [place] [address]   SARS Coronavirus 2 by RT PCR 01/01/2022 NEGATIVE  NEGATIVE Final   Comment: (NOTE) SARS-CoV-2 target nucleic acids are NOT DETECTED.  The SARS-CoV-2 RNA is generally detectable in upper and lower respiratory specimens during the acute phase of infection. The lowest concentration of SARS-CoV-2 viral copies this assay can detect is 250 copies / mL. A negative result does not preclude SARS-CoV-2 infection and should not be used as the sole basis for treatment or other patient management decisions.  A negative result may occur with improper specimen collection / handling, submission of specimen other than nasopharyngeal swab, presence of viral mutation(s) within the areas targeted by this assay, and inadequate number of viral copies (<250 copies / mL). A negative result must be combined with clinical observations, patient history, and epidemiological information.  Fact Sheet for Patients:   https://www.patel.info/  Fact Sheet for Healthcare Providers: https://hall.com/  This test is not yet approved or                           cleared by the [place] FDA and has been authorized for detection and/or diagnosis of SARS-CoV-2 by FDA under an Emergency Use Authorization (EUA).  This EUA will remain in effect (meaning this test can be used) for the duration of the COVID-19 declaration under Section 564(b)(1) of the Act, 21 U.S.C. section 360bbb-3(b)(1), unless the authorization is terminated or revoked sooner.  Performed at [facility], [place] [address], [place], [place] [address]   Admission on 12/12/2021, Discharged on 12/13/2021  Component Date Value Ref Range Status   Sodium 12/12/2021 139  135 - 145  mmol/L Final   Potassium 12/12/2021 3.8  3.5 - 5.1 mmol/L Final   Chloride 12/12/2021 108  98 - 111 mmol/L Final   CO2 12/12/2021 18 (L)  22 - 32 mmol/L  Final   Glucose, Bld 12/12/2021 106 (H)  70 - 99 mg/dL Final   Glucose reference range applies only to samples taken after fasting for at least 8 hours.   BUN 12/12/2021 7  6 - 20 mg/dL Final   Creatinine, Ser 12/12/2021 0.75  0.44 - 1.00 mg/dL Final   Calcium 12/12/2021 9.4  8.9 - 10.3 mg/dL Final   GFR, Estimated 12/12/2021 >60  >60 mL/min Final   Comment: (NOTE) Calculated using the CKD-EPI Creatinine Equation (2021)    Anion gap 12/12/2021 13  5 - 15 Final   Performed at [facility], [place] [address] [place], [place] [address]   WBC 12/12/2021 11.7 (H)  4.0 - 10.5 K/uL Final   RBC 12/12/2021 4.84  3.87 - 5.11 MIL/uL Final   Hemoglobin 12/12/2021 12.5  12.0 - 15.0 g/dL Final   HCT 12/12/2021 39.1  36.0 - 46.0 % Final   MCV 12/12/2021 80.8  80.0 - 100.0 fL Final   MCH 12/12/2021 25.8 (L)  26.0 - 34.0 pg Final   MCHC 12/12/2021 32.0  30.0 - 36.0 g/dL Final   RDW 12/12/2021 13.7  11.5 - 15.5 % Final   Platelets 12/12/2021 325  150 - 400 K/uL Final   nRBC 12/12/2021 0.0  0.0 - 0.2 % Final   Performed at [facility] [address], [place], [place] [address]   Troponin I (High Sensitivity) 12/12/2021 8  <18 ng/L Final   Comment: (NOTE) Elevated high sensitivity troponin I (hsTnI) values and significant  changes across serial measurements may suggest ACS but many other  chronic and acute conditions are known to elevate hsTnI results.  Refer to the "Links" section for chest pain algorithms and additional  guidance. Performed at [facility], [place] [address], [place], [place] [address]    I-stat hCG, quantitative 12/12/2021 <5.0  <5 mIU/mL Final   Comment 3 12/12/2021          Final   Comment:   GEST. AGE      CONC.  (mIU/mL)   <=1 WEEK        5 - 50     2 WEEKS       50 - 500     3 WEEKS       100 - 10,000     4 WEEKS     1,000 - 30,000        FEMALE AND NON-PREGNANT FEMALE:     LESS THAN 5 mIU/mL    Troponin I (High Sensitivity) 12/13/2021 4  <18 ng/L Final    Comment: (NOTE) Elevated high sensitivity troponin I (hsTnI) values and significant  changes across serial measurements may suggest ACS but many other  chronic and acute conditions are known to elevate hsTnI results.  Refer to the "Links" section for chest pain algorithms and additional  guidance. Performed at [facility], [place] [address], [place], [place] [address]   Admission on 12/10/2021, Discharged on 12/11/2021  Component Date Value Ref Range Status   SARS Coronavirus 2 by RT PCR 12/10/2021 NEGATIVE  NEGATIVE Final   Comment: (NOTE) SARS-CoV-2 target nucleic acids are NOT DETECTED.  The SARS-CoV-2 RNA is generally detectable in upper respiratory specimens during the acute phase of infection.  The lowest concentration of SARS-CoV-2 viral copies this assay can detect is 138 copies/mL. A negative result does not preclude SARS-Cov-2 infection and should not be used as the sole basis for treatment or other patient management decisions. A negative result may occur with  improper specimen collection/handling, submission of specimen other than nasopharyngeal swab, presence of viral mutation(s) within the areas targeted by this assay, and inadequate number of viral copies(<138 copies/mL). A negative result must be combined with clinical observations, patient history, and epidemiological information. The expected result is Negative.  Fact Sheet for Patients:  EntrepreneurPulse.com.au  Fact Sheet for Healthcare Providers:  IncredibleEmployment.be  This test is no                          t yet approved or cleared by the [place] FDA and  has been authorized for detection and/or diagnosis of SARS-CoV-2 by FDA under an Emergency Use Authorization (EUA). This EUA will remain  in effect (meaning this test can be used) for the duration of the COVID-19 declaration under Section 564(b)(1) of the Act, 21 U.S.C.section 360bbb-3(b)(1), unless  the authorization is terminated  or revoked sooner.       Influenza A by PCR 12/10/2021 NEGATIVE  NEGATIVE Final   Influenza B by PCR 12/10/2021 NEGATIVE  NEGATIVE Final   Comment: (NOTE) The Xpert Xpress SARS-CoV-2/FLU/RSV plus assay is intended as an aid in the diagnosis of influenza from Nasopharyngeal swab specimens and should not be used as a sole basis for treatment. Nasal washings and aspirates are unacceptable for Xpert Xpress SARS-CoV-2/FLU/RSV testing.  Fact Sheet for Patients: EntrepreneurPulse.com.au  Fact Sheet for Healthcare Providers: IncredibleEmployment.be  This test is not yet approved or cleared by the [place] FDA and has been authorized for detection and/or diagnosis of SARS-CoV-2 by FDA under an Emergency Use Authorization (EUA). This EUA will remain in effect (meaning this test can be used) for the duration of the COVID-19 declaration under Section 564(b)(1) of the Act, 21 U.S.C. section 360bbb-3(b)(1), unless the authorization is terminated or revoked.  Performed at [facility], [place] [address], [place], [place] [address]    WBC 12/10/2021 10.8 (H)  4.0 - 10.5 K/uL Final   RBC 12/10/2021 4.46  3.87 - 5.11 MIL/uL Final   Hemoglobin 12/10/2021 11.5 (L)  12.0 - 15.0 g/dL Final   HCT 12/10/2021 35.6 (L)  36.0 - 46.0 % Final   MCV 12/10/2021 79.8 (L)  80.0 - 100.0 fL Final   MCH 12/10/2021 25.8 (L)  26.0 - 34.0 pg Final   MCHC 12/10/2021 32.3  30.0 - 36.0 g/dL Final   RDW 12/10/2021 13.6  11.5 - 15.5 % Final   Platelets 12/10/2021 325  150 - 400 K/uL Final   nRBC 12/10/2021 0.0  0.0 - 0.2 % Final   Neutrophils Relative % 12/10/2021 59  % Final   Neutro Abs 12/10/2021 6.4  1.7 - 7.7 K/uL Final   Lymphocytes Relative 12/10/2021 33  % Final   Lymphs Abs 12/10/2021 3.6  0.7 - 4.0 K/uL Final   Monocytes Relative 12/10/2021 6  % Final   Monocytes Absolute 12/10/2021 0.6  0.1 - 1.0 K/uL Final   Eosinophils  Relative 12/10/2021 1  % Final   Eosinophils Absolute 12/10/2021 0.1  0.0 - 0.5 K/uL Final   Basophils Relative 12/10/2021 1  % Final   Basophils Absolute 12/10/2021 0.1  0.0 - 0.1 K/uL Final   Immature Granulocytes 12/10/2021  0  % Final   Abs Immature Granulocytes 12/10/2021 0.04  0.00 - 0.07 K/uL Final   Performed at [facility], [place] [address], [place], [place] [address]   Sodium 12/10/2021 137  135 - 145 mmol/L Final   Potassium 12/10/2021 3.6  3.5 - 5.1 mmol/L Final   Chloride 12/10/2021 108  98 - 111 mmol/L Final   CO2 12/10/2021 21 (L)  22 - 32 mmol/L Final   Glucose, Bld 12/10/2021 95  70 - 99 mg/dL Final   Glucose reference range applies only to samples taken after fasting for at least 8 hours.   BUN 12/10/2021 5 (L)  6 - 20 mg/dL Final   Creatinine, Ser 12/10/2021 0.61  0.44 - 1.00 mg/dL Final   Calcium 12/10/2021 9.2  8.9 - 10.3 mg/dL Final   Total Protein 12/10/2021 6.9  6.5 - 8.1 g/dL Final   Albumin 12/10/2021 3.6  3.5 - 5.0 g/dL Final   AST 12/10/2021 17  15 - 41 U/L Final   ALT 12/10/2021 13  0 - 44 U/L Final   Alkaline Phosphatase 12/10/2021 67  38 - 126 U/L Final   Total Bilirubin 12/10/2021 0.3  0.3 - 1.2 mg/dL Final   GFR, Estimated 12/10/2021 >60  >60 mL/min Final   Comment: (NOTE) Calculated using the CKD-EPI Creatinine Equation (2021)    Anion gap 12/10/2021 8  5 - 15 Final   Performed at [facility] [address] [place], [place] [address]   Cholesterol 12/10/2021 175  0 - 200 mg/dL Final   Triglycerides 12/10/2021 145  <150 mg/dL Final   HDL 12/10/2021 44  >40 mg/dL Final   Total CHOL/HDL Ratio 12/10/2021 4.0  RATIO Final   VLDL 12/10/2021 29  0 - 40 mg/dL Final   LDL Cholesterol 12/10/2021 102 (H)  0 - 99 mg/dL Final   Comment:        Total Cholesterol/HDL:CHD Risk Coronary Heart Disease Risk Table                     Men   Women  1/2 Average Risk   3.4   3.3  Average Risk       5.0   4.4  2 X Average Risk   9.6   7.1  3 X Average  Risk  23.4   11.0        Use the calculated Patient Ratio above and the CHD Risk Table to determine the patient's CHD Risk.        ATP III CLASSIFICATION (LDL):  <100     mg/dL   Optimal  100-129  mg/dL   Near or Above                    Optimal  130-159  mg/dL   Borderline  160-189  mg/dL   High  >190     mg/dL   Very High Performed at [facility] [address], [place], [place] [address]    POC Amphetamine UR 12/10/2021 None Detected  NONE DETECTED (Cut Off Level 1000 ng/mL) Final   POC Secobarbital (BAR) 12/10/2021 None Detected  NONE DETECTED (Cut Off Level 300 ng/mL) Final   POC Buprenorphine (BUP) 12/10/2021 None Detected  NONE DETECTED (Cut Off Level 10 ng/mL) Final   POC Oxazepam (BZO) 12/10/2021 None Detected  NONE DETECTED (Cut Off Level 300 ng/mL) Final   POC Cocaine UR 12/10/2021 None Detected  NONE DETECTED (  Cut Off Level 300 ng/mL) Final   POC Methamphetamine UR 12/10/2021 None Detected  NONE DETECTED (Cut Off Level 1000 ng/mL) Final   POC Morphine 12/10/2021 None Detected  NONE DETECTED (Cut Off Level 300 ng/mL) Final   POC Methadone UR 12/10/2021 None Detected  NONE DETECTED (Cut Off Level 300 ng/mL) Final   POC Oxycodone UR 12/10/2021 None Detected  NONE DETECTED (Cut Off Level 100 ng/mL) Final   POC Marijuana UR 12/10/2021 None Detected  NONE DETECTED (Cut Off Level 50 ng/mL) Final   SARSCOV2ONAVIRUS 2 AG 12/10/2021 NEGATIVE  NEGATIVE Final   Comment: (NOTE) SARS-CoV-2 antigen NOT DETECTED.   Negative results are presumptive.  Negative results do not preclude SARS-CoV-2 infection and should not be used as the sole basis for treatment or other patient management decisions, including infection  control decisions, particularly in the presence of clinical signs and  symptoms consistent with COVID-19, or in those who have been in contact with the virus.  Negative results must be combined with clinical observations, patient history, and  epidemiological information. The expected result is Negative.  Fact Sheet for Patients: HandmadeRecipes.com.cy  Fact Sheet for Healthcare Providers: FuneralLife.at  This test is not yet approved or cleared by the [place] FDA and  has been authorized for detection and/or diagnosis of SARS-CoV-2 by FDA under an Emergency Use Authorization (EUA).  This EUA will remain in effect (meaning this test can be used) for the duration of  the COV                          ID-19 declaration under Section 564(b)(1) of the Act, 21 U.S.C. section 360bbb-3(b)(1), unless the authorization is terminated or revoked sooner.     hCG, Beta Chain, Quant, S 12/10/2021 <1  <5 mIU/mL Final   Comment:          GEST. AGE      CONC.  (mIU/mL)   <=1 WEEK        5 - 50     2 WEEKS       50 - 500     3 WEEKS       100 - 10,000     4 WEEKS     1,000 - 30,000     5 WEEKS     3,500 - 115,000   6-8 WEEKS     12,000 - 270,000    12 WEEKS     15,000 - 220,000        FEMALE AND NON-PREGNANT FEMALE:     LESS THAN 5 mIU/mL Performed at [facility], [place] [address] [place], [place] [address]    TSH 12/10/2021 1.245  0.350 - 4.500 uIU/mL Final   Comment: Performed by a 3rd Generation assay with a functional sensitivity of <=0.01 uIU/mL. Performed at [facility], [place] [address] [place], [place] [address]   Admission on 11/29/2021, Discharged on 11/29/2021  Component Date Value Ref Range Status   WBC 11/29/2021 10.3  4.0 - 10.5 K/uL Final   RBC 11/29/2021 4.55  3.87 - 5.11 MIL/uL Final   Hemoglobin 11/29/2021 12.0  12.0 - 15.0 g/dL Final   HCT 11/29/2021 37.7  36.0 - 46.0 % Final   MCV 11/29/2021 82.9  80.0 - 100.0 fL Final   MCH 11/29/2021 26.4  26.0 - 34.0 pg Final   MCHC 11/29/2021 31.8  30.0 - 36.0 g/dL Final   RDW 11/29/2021 13.8  11.5 -  15.5 % Final   Platelets 11/29/2021 276  150 - 400 K/uL Final   nRBC 11/29/2021 0.0  0.0 - 0.2 % Final    Neutrophils Relative % 11/29/2021 58  % Final   Neutro Abs 11/29/2021 6.0  1.7 - 7.7 K/uL Final   Lymphocytes Relative 11/29/2021 35  % Final   Lymphs Abs 11/29/2021 3.6  0.7 - 4.0 K/uL Final   Monocytes Relative 11/29/2021 5  % Final   Monocytes Absolute 11/29/2021 0.5  0.1 - 1.0 K/uL Final   Eosinophils Relative 11/29/2021 1  % Final   Eosinophils Absolute 11/29/2021 0.1  0.0 - 0.5 K/uL Final   Basophils Relative 11/29/2021 1  % Final   Basophils Absolute 11/29/2021 0.1  0.0 - 0.1 K/uL Final   Immature Granulocytes 11/29/2021 0  % Final   Abs Immature Granulocytes 11/29/2021 0.03  0.00 - 0.07 K/uL Final   Performed at [facility], [place] [address] [place], [place] [address]   Sodium 11/29/2021 140  135 - 145 mmol/L Final   Potassium 11/29/2021 3.9  3.5 - 5.1 mmol/L Final   Chloride 11/29/2021 106  98 - 111 mmol/L Final   CO2 11/29/2021 23  22 - 32 mmol/L Final   Glucose, Bld 11/29/2021 101 (H)  70 - 99 mg/dL Final   Glucose reference range applies only to samples taken after fasting for at least 8 hours.   BUN 11/29/2021 6  6 - 20 mg/dL Final   Creatinine, Ser 11/29/2021 0.64  0.44 - 1.00 mg/dL Final   Calcium 11/29/2021 9.2  8.9 - 10.3 mg/dL Final   GFR, Estimated 11/29/2021 >60  >60 mL/min Final   Comment: (NOTE) Calculated using the CKD-EPI Creatinine Equation (2021)    Anion gap 11/29/2021 11  5 - 15 Final   Performed at [facility], [place] [address], [place], [place] [address]   Troponin I (High Sensitivity) 11/29/2021 3  <18 ng/L Final   Comment: (NOTE) Elevated high sensitivity troponin I (hsTnI) values and significant  changes across serial measurements may suggest ACS but many other  chronic and acute conditions are known to elevate hsTnI results.  Refer to the "Links" section for chest pain algorithms and additional  guidance. Performed at [facility], [place] [address] [place],  [address]    I-stat hCG, quantitative 11/29/2021 <5.0  <5 mIU/mL  Final   Comment 3 11/29/2021          Final   Comment:   GEST. AGE      CONC.  (mIU/mL)   <=1 WEEK        5 - 50     2 WEEKS       50 - 500     3 WEEKS       100 - 10,000     4 WEEKS     1,000 - 30,000        FEMALE AND NON-PREGNANT FEMALE:     LESS THAN 5 mIU/mL   Admission on 11/01/2021, Discharged on 11/03/2021  Component Date Value Ref Range Status   SARS Coronavirus 2 by RT PCR 11/01/2021 NEGATIVE  NEGATIVE Final   Comment: (NOTE) SARS-CoV-2 target nucleic acids are NOT DETECTED.  The SARS-CoV-2 RNA is generally detectable in upper respiratory specimens during the acute phase of infection. The lowest concentration of SARS-CoV-2 viral copies this assay can detect is 138 copies/mL. A negative result does not preclude SARS-Cov-2 infection and should not be  used as the sole basis for treatment or other patient management decisions. A negative result may occur with  improper specimen collection/handling, submission of specimen other than nasopharyngeal swab, presence of viral mutation(s) within the areas targeted by this assay, and inadequate number of viral copies(<138 copies/mL). A negative result must be combined with clinical observations, patient history, and epidemiological information. The expected result is Negative.  Fact Sheet for Patients:  EntrepreneurPulse.com.au  Fact Sheet for Healthcare Providers:  IncredibleEmployment.be  This test is no                          t yet approved or cleared by the [place] FDA and  has been authorized for detection and/or diagnosis of SARS-CoV-2 by FDA under an Emergency Use Authorization (EUA). This EUA will remain  in effect (meaning this test can be used) for the duration of the COVID-19 declaration under Section 564(b)(1) of the Act, 21 U.S.C.section 360bbb-3(b)(1), unless the authorization is terminated  or revoked sooner.       Influenza A by PCR 11/01/2021 NEGATIVE  NEGATIVE  Final   Influenza B by PCR 11/01/2021 NEGATIVE  NEGATIVE Final   Comment: (NOTE) The Xpert Xpress SARS-CoV-2/FLU/RSV plus assay is intended as an aid in the diagnosis of influenza from Nasopharyngeal swab specimens and should not be used as a sole basis for treatment. Nasal washings and aspirates are unacceptable for Xpert Xpress SARS-CoV-2/FLU/RSV testing.  Fact Sheet for Patients: EntrepreneurPulse.com.au  Fact Sheet for Healthcare Providers: IncredibleEmployment.be  This test is not yet approved or cleared by the [place] FDA and has been authorized for detection and/or diagnosis of SARS-CoV-2 by FDA under an Emergency Use Authorization (EUA). This EUA will remain in effect (meaning this test can be used) for the duration of the COVID-19 declaration under Section 564(b)(1) of the Act, 21 U.S.C. section 360bbb-3(b)(1), unless the authorization is terminated or revoked.  Performed at [facility], [place] [address], [place], [place] [address]    Preg Test, Ur 11/01/2021 NEGATIVE  NEGATIVE Final   Comment:        THE SENSITIVITY OF THIS METHODOLOGY IS >24 mIU/mL   Admission on 10/28/2021, Discharged on 10/30/2021  Component Date Value Ref Range Status   Sodium 10/29/2021 138  135 - 145 mmol/L Final   Potassium 10/29/2021 3.8  3.5 - 5.1 mmol/L Final   Chloride 10/29/2021 107  98 - 111 mmol/L Final   CO2 10/29/2021 23  22 - 32 mmol/L Final   Glucose, Bld 10/29/2021 112 (H)  70 - 99 mg/dL Final   Glucose reference range applies only to samples taken after fasting for at least 8 hours.   BUN 10/29/2021 6  6 - 20 mg/dL Final   Creatinine, Ser 10/29/2021 0.70  0.44 - 1.00 mg/dL Final   Calcium 10/29/2021 9.1  8.9 - 10.3 mg/dL Final   Total Protein 10/29/2021 6.4 (L)  6.5 - 8.1 g/dL Final   Albumin 10/29/2021 3.6  3.5 - 5.0 g/dL Final   AST 10/29/2021 17  15 - 41 U/L Final   ALT 10/29/2021 14  0 - 44 U/L Final   Alkaline Phosphatase  10/29/2021 76  38 - 126 U/L Final   Total Bilirubin 10/29/2021 0.3  0.3 - 1.2 mg/dL Final   GFR, Estimated 10/29/2021 >60  >60 mL/min Final   Comment: (NOTE) Calculated using the CKD-EPI Creatinine Equation (2021)    Anion gap 10/29/2021 8  5 -  15 Final   Performed at [facility], [place] [address], [place], [place] [address]   Alcohol, Ethyl (B) 10/29/2021 <10  <10 mg/dL Final   Comment: (NOTE) Lowest detectable limit for serum alcohol is 10 mg/dL.  For medical purposes only. Performed at [facility], [place] [address], [place], [place] [address]    Salicylate Lvl 09/81/1914 <7.0 (L)  7.0 - 30.0 mg/dL Final   Performed at [place] [address], [place], [place] [address]   Acetaminophen (Tylenol), Serum 10/29/2021 <10 (L)  10 - 30 ug/mL Final   Comment: (NOTE) Therapeutic concentrations vary significantly. A range of 10-30 ug/mL  may be an effective concentration for many patients. However, some  are best treated at concentrations outside of this range. Acetaminophen concentrations >150 ug/mL at 4 hours after ingestion  and >50 ug/mL at 12 hours after ingestion are often associated with  toxic reactions.  Performed at [facility], [place] [address] [place], [place] [address]    WBC 10/29/2021 9.5  4.0 - 10.5 K/uL Final   RBC 10/29/2021 4.49  3.87 - 5.11 MIL/uL Final   Hemoglobin 10/29/2021 11.8 (L)  12.0 - 15.0 g/dL Final   HCT 10/29/2021 37.0  36.0 - 46.0 % Final   MCV 10/29/2021 82.4  80.0 - 100.0 fL Final   MCH 10/29/2021 26.3  26.0 - 34.0 pg Final   MCHC 10/29/2021 31.9  30.0 - 36.0 g/dL Final   RDW 10/29/2021 13.7  11.5 - 15.5 % Final   Platelets 10/29/2021 292  150 - 400 K/uL Final   nRBC 10/29/2021 0.0  0.0 - 0.2 % Final   Performed at [place] [address], [place], [place] [address]   Opiates 10/29/2021 NONE DETECTED  NONE DETECTED Final   Cocaine 10/29/2021 NONE DETECTED  NONE DETECTED Final   Benzodiazepines 10/29/2021 NONE  DETECTED  NONE DETECTED Final   Amphetamines 10/29/2021 NONE DETECTED  NONE DETECTED Final   Tetrahydrocannabinol 10/29/2021 NONE DETECTED  NONE DETECTED Final   Barbiturates 10/29/2021 NONE DETECTED  NONE DETECTED Final   Comment: (NOTE) DRUG SCREEN FOR MEDICAL PURPOSES ONLY.  IF CONFIRMATION IS NEEDED FOR ANY PURPOSE, NOTIFY LAB WITHIN 5 DAYS.  LOWEST DETECTABLE LIMITS FOR URINE DRUG SCREEN Drug Class                     Cutoff (ng/mL) Amphetamine and metabolites    1000 Barbiturate and metabolites    200 Benzodiazepine                 469 Tricyclics and metabolites     300 Opiates and metabolites        300 Cocaine and metabolites        300 THC                            50 Performed at [facility], [place] [address] [place], [place] [address]    I-stat hCG, quantitative 10/29/2021 <5.0  <5 mIU/mL Final   Comment 3 10/29/2021          Final   Comment:   GEST. AGE      CONC.  (mIU/mL)   <=1 WEEK        5 - 50     2 WEEKS       50 - 500     3 WEEKS  100 - 10,000     4 WEEKS     1,000 - 30,000        FEMALE AND NON-PREGNANT FEMALE:     LESS THAN 5 mIU/mL    Lactic Acid, Venous 10/29/2021 1.8  0.5 - 1.9 mmol/L Final   Performed at [facility], [place] [address] [place], [place] [address]   SARS Coronavirus 2 by RT PCR 10/29/2021 NEGATIVE  NEGATIVE Final   Comment: (NOTE) SARS-CoV-2 target nucleic acids are NOT DETECTED.  The SARS-CoV-2 RNA is generally detectable in upper respiratory specimens during the acute phase of infection. The lowest concentration of SARS-CoV-2 viral copies this assay can detect is 138 copies/mL. A negative result does not preclude SARS-Cov-2 infection and should not be used as the sole basis for treatment or other patient management decisions. A negative result may occur with  improper specimen collection/handling, submission of specimen other than nasopharyngeal swab, presence of viral mutation(s) within the areas targeted by this  assay, and inadequate number of viral copies(<138 copies/mL). A negative result must be combined with clinical observations, patient history, and epidemiological information. The expected result is Negative.  Fact Sheet for Patients:  EntrepreneurPulse.com.au  Fact Sheet for Healthcare Providers:  IncredibleEmployment.be  This test is no                          t yet approved or cleared by the [place] FDA and  has been authorized for detection and/or diagnosis of SARS-CoV-2 by FDA under an Emergency Use Authorization (EUA). This EUA will remain  in effect (meaning this test can be used) for the duration of the COVID-19 declaration under Section 564(b)(1) of the Act, 21 U.S.C.section 360bbb-3(b)(1), unless the authorization is terminated  or revoked sooner.       Influenza A by PCR 10/29/2021 NEGATIVE  NEGATIVE Final   Influenza B by PCR 10/29/2021 NEGATIVE  NEGATIVE Final   Comment: (NOTE) The Xpert Xpress SARS-CoV-2/FLU/RSV plus assay is intended as an aid in the diagnosis of influenza from Nasopharyngeal swab specimens and should not be used as a sole basis for treatment. Nasal washings and aspirates are unacceptable for Xpert Xpress SARS-CoV-2/FLU/RSV testing.  Fact Sheet for Patients: EntrepreneurPulse.com.au  Fact Sheet for Healthcare Providers: IncredibleEmployment.be  This test is not yet approved or cleared by the [place] FDA and has been authorized for detection and/or diagnosis of SARS-CoV-2 by FDA under an Emergency Use Authorization (EUA). This EUA will remain in effect (meaning this test can be used) for the duration of the COVID-19 declaration under Section 564(b)(1) of the Act, 21 U.S.C. section 360bbb-3(b)(1), unless the authorization is terminated or revoked.  Performed at [facility], [place] [address], [place], [place] [address]   Admission on 10/25/2021,  Discharged on 10/26/2021  Component Date Value Ref Range Status   SARS Coronavirus 2 by RT PCR 10/25/2021 NEGATIVE  NEGATIVE Final   Comment: (NOTE) SARS-CoV-2 target nucleic acids are NOT DETECTED.  The SARS-CoV-2 RNA is generally detectable in upper respiratory specimens during the acute phase of infection. The lowest concentration of SARS-CoV-2 viral copies this assay can detect is 138 copies/mL. A negative result does not preclude SARS-Cov-2 infection and should not be used as the sole basis for treatment or other patient management decisions. A negative result may occur with  improper specimen collection/handling, submission of specimen other than nasopharyngeal swab, presence of viral mutation(s) within the areas targeted by this assay,  and inadequate number of viral copies(<138 copies/mL). A negative result must be combined with clinical observations, patient history, and epidemiological information. The expected result is Negative.  Fact Sheet for Patients:  EntrepreneurPulse.com.au  Fact Sheet for Healthcare Providers:  IncredibleEmployment.be  This test is no                          t yet approved or cleared by the [place] FDA and  has been authorized for detection and/or diagnosis of SARS-CoV-2 by FDA under an Emergency Use Authorization (EUA). This EUA will remain  in effect (meaning this test can be used) for the duration of the COVID-19 declaration under Section 564(b)(1) of the Act, 21 U.S.C.section 360bbb-3(b)(1), unless the authorization is terminated  or revoked sooner.       Influenza A by PCR 10/25/2021 NEGATIVE  NEGATIVE Final   Influenza B by PCR 10/25/2021 NEGATIVE  NEGATIVE Final   Comment: (NOTE) The Xpert Xpress SARS-CoV-2/FLU/RSV plus assay is intended as an aid in the diagnosis of influenza from Nasopharyngeal swab specimens and should not be used as a sole basis for treatment. Nasal washings and aspirates  are unacceptable for Xpert Xpress SARS-CoV-2/FLU/RSV testing.  Fact Sheet for Patients: EntrepreneurPulse.com.au  Fact Sheet for Healthcare Providers: IncredibleEmployment.be  This test is not yet approved or cleared by the [place] FDA and has been authorized for detection and/or diagnosis of SARS-CoV-2 by FDA under an Emergency Use Authorization (EUA). This EUA will remain in effect (meaning this test can be used) for the duration of the COVID-19 declaration under Section 564(b)(1) of the Act, 21 U.S.C. section 360bbb-3(b)(1), unless the authorization is terminated or revoked.  Performed at [facility], [place] [address], [place], [place] [address]    POC Amphetamine UR 10/26/2021 None Detected  NONE DETECTED (Cut Off Level 1000 ng/mL) Final   POC Secobarbital (BAR) 10/26/2021 None Detected  NONE DETECTED (Cut Off Level 300 ng/mL) Final   POC Buprenorphine (BUP) 10/26/2021 None Detected  NONE DETECTED (Cut Off Level 10 ng/mL) Final   POC Oxazepam (BZO) 10/26/2021 None Detected  NONE DETECTED (Cut Off Level 300 ng/mL) Final   POC Cocaine UR 10/26/2021 None Detected  NONE DETECTED (Cut Off Level 300 ng/mL) Final   POC Methamphetamine UR 10/26/2021 None Detected  NONE DETECTED (Cut Off Level 1000 ng/mL) Final   POC Morphine 10/26/2021 None Detected  NONE DETECTED (Cut Off Level 300 ng/mL) Final   POC Methadone UR 10/26/2021 None Detected  NONE DETECTED (Cut Off Level 300 ng/mL) Final   POC Oxycodone UR 10/26/2021 None Detected  NONE DETECTED (Cut Off Level 100 ng/mL) Final   POC Marijuana UR 10/26/2021 None Detected  NONE DETECTED (Cut Off Level 50 ng/mL) Final   SARSCOV2ONAVIRUS 2 AG 10/25/2021 NEGATIVE  NEGATIVE Final   Comment: (NOTE) SARS-CoV-2 antigen NOT DETECTED.   Negative results are presumptive.  Negative results do not preclude SARS-CoV-2 infection and should not be used as the sole basis for treatment or other patient  management decisions, including infection  control decisions, particularly in the presence of clinical signs and  symptoms consistent with COVID-19, or in those who have been in contact with the virus.  Negative results must be combined with clinical observations, patient history, and epidemiological information. The expected result is Negative.  Fact Sheet for Patients: HandmadeRecipes.com.cy  Fact Sheet for Healthcare Providers: FuneralLife.at  This test is not yet approved or cleared by the [place] FDA  and  has been authorized for detection and/or diagnosis of SARS-CoV-2 by FDA under an Emergency Use Authorization (EUA).  This EUA will remain in effect (meaning this test can be used) for the duration of  the COV                          ID-19 declaration under Section 564(b)(1) of the Act, 21 U.S.C. section 360bbb-3(b)(1), unless the authorization is terminated or revoked sooner.     Preg Test, Ur 10/26/2021 NEGATIVE  NEGATIVE Final   Comment:        THE SENSITIVITY OF THIS METHODOLOGY IS >24 mIU/mL   There may be more visits with results that are not included.    Allergies: Bee venom, Coconut flavor, Fish allergy, Geodon [ziprasidone hcl], Haloperidol and related, Lithobid [lithium], Roxicodone [oxycodone], Seroquel [quetiapine], Shellfish allergy, Phenergan [promethazine hcl], Prilosec [omeprazole], Sulfa antibiotics, Tegretol [carbamazepine], Prozac [fluoxetine], Tape, and Tylenol [acetaminophen]  PTA Medications: (Not in a hospital admission)  Prior to Admission medications   Medication Sig Start Date End Date Taking? Authorizing Provider  albuterol (VENTOLIN HFA) 108 (90 Base) MCG/ACT inhaler Inhale 2 puffs into the lungs every 6 (six) hours as needed for wheezing or shortness of breath.    [provider]  Asenapine (SECUADO) 3.8 MG/24HR PT24 Place 3.8 mg onto the skin daily for 7 days. 01/23/22 01/30/22   Massengill, Ovid Curd, MD  budesonide-formoterol (SYMBICORT) 160-4.5 MCG/ACT inhaler Inhale 2 puffs into the lungs 2 (two) times daily.    [provider]  melatonin 3 MG TABS tablet Take 1 tablet (3 mg total) by mouth at bedtime for 7 days. 01/23/22 01/30/22  Massengill, Ovid Curd, MD  metoprolol succinate (TOPROL-XL) 25 MG 24 hr tablet Take 0.5 tablets (12.5 mg total) by mouth daily for 7 doses. 01/23/22 01/30/22  Massengill, Ovid Curd, MD  ondansetron (ZOFRAN-ODT) 4 MG disintegrating tablet Take 1 tablet (4 mg total) by mouth every 8 (eight) hours as needed for nausea or vomiting. 01/18/22   Lennice Sites, DO    Medical Decision Making  Recommend admission to continuous observation unit for safety monitoring. Patient unable to contract for safety and unsafe for discharge at this time.  Reeval for SI/HI/AVH in a.m.   Lab Orders         Resp panel by RT-PCR (RSV, Flu A&B, Covid) Anterior Nasal Swab         CBC with Differential/Platelet         Comprehensive metabolic panel         POC urine preg, ED         POCT Urine Drug Screen - (I-Screen)         POC SARS Coronavirus 2 Ag      Home medications restarted this encounter -Toprol-XL 24-hour 12.5 mg p.o. daily tachycardia  Other PRNs -Ventolin HFA 2 puffs inhalation every 6 hours as needed wheezing, shortness of breath -Maalox 30 mm p.o. every 4 hours as needed indigestion -Atarax 25 mg p.o. 3 times daily as needed anxiety -MOM 30 mm p.o. daily as needed constipation -Melatonin 3 mg p.o. nightly as needed sleep    Recommendations  Based on my evaluation the patient does not appear to have an emergency medical condition.  Recommend admission to continuous observation unit for safety monitoring.  Patient unable to contract for safety.  Reeval for SI/HI/AVH in a.m.  Randon Goldsmith, NP 01/30/22  11:01 PM

## 2022-01-30 NOTE — ED Notes (Signed)
Patient A&Ox4. Patient present to [facility] voluntarily, accompanied by [employer] with complaint of suicidal ideation, with no plan. Pt reports having withdrawal symptoms since her medications were discontinued after Thanksgiving holiday when she overdosed on them. Pt denies HI, AVH and substance/alcohol use. Patient denies any physical complaints when asked. No acute distress noted. Support, encouragement and reassurance provided about ongoing stressors. Routine safety checks conducted according to facility protocol. Patient encouraged to notify staff if thoughts of harm toward self or others arise. Patient verbalize understanding and agreement. Will continue to monitor for safety.

## 2022-01-30 NOTE — ED Triage Notes (Signed)
Pt presents to [facility] voluntarily, accompanied by [employer] with complaint of suicidal ideation, with no plan. Pt reports having withdrawal symptoms since after Thanksgiving holiday from not taking medications. Pt feels shaky, nauseaus and experiencing headaches. Pt also reports that she has scheduled session with [facility] services tomorrow, but was unable to wait until then to be seen. Pt continues to have numerous [facility] hospitalizations with hx of SI attempts, diagnosis of borderline personality disorder. Pt reports beign prescribed Vraylar, Lexapro and Lyrica, but has not had medications since before Thaksg[place]. Pt denies HI, AVH and substance/alcohol use.
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
## 2022-01-31 ENCOUNTER — Encounter (HOSPITAL_COMMUNITY): Payer: Self-pay | Admitting: Registered Nurse

## 2022-01-31 ENCOUNTER — Inpatient Hospital Stay (HOSPITAL_COMMUNITY)
Admission: AD | Admit: 2022-01-31 | Discharge: 2022-02-06 | DRG: 885 | Disposition: A | Discharge status: Home / Self Care | Payer: PPO | Source: Intra-hospital | Attending: Psychiatry | Admitting: Psychiatry

## 2022-01-31 DIAGNOSIS — Z79899 Other long term (current) drug therapy: Secondary | ICD-10-CM

## 2022-01-31 DIAGNOSIS — Z825 Family history of asthma and other chronic lower respiratory diseases: Secondary | ICD-10-CM

## 2022-01-31 DIAGNOSIS — J45909 Unspecified asthma, uncomplicated: Secondary | ICD-10-CM | POA: Diagnosis not present

## 2022-01-31 DIAGNOSIS — I10 Essential (primary) hypertension: Secondary | ICD-10-CM | POA: Diagnosis not present

## 2022-01-31 DIAGNOSIS — F4323 Adjustment disorder with mixed anxiety and depressed mood: Secondary | ICD-10-CM | POA: Diagnosis present

## 2022-01-31 DIAGNOSIS — G4700 Insomnia, unspecified: Secondary | ICD-10-CM | POA: Diagnosis not present

## 2022-01-31 DIAGNOSIS — F4310 Post-traumatic stress disorder, unspecified: Secondary | ICD-10-CM | POA: Diagnosis present

## 2022-01-31 DIAGNOSIS — M797 Fibromyalgia: Secondary | ICD-10-CM | POA: Diagnosis present

## 2022-01-31 DIAGNOSIS — Z882 Allergy status to sulfonamides status: Secondary | ICD-10-CM | POA: Diagnosis not present

## 2022-01-31 DIAGNOSIS — Z888 Allergy status to other drugs, medicaments and biological substances status: Secondary | ICD-10-CM

## 2022-01-31 DIAGNOSIS — Z886 Allergy status to analgesic agent status: Secondary | ICD-10-CM

## 2022-01-31 DIAGNOSIS — Z148 Genetic carrier of other disease: Secondary | ICD-10-CM

## 2022-01-31 DIAGNOSIS — Z87891 Personal history of nicotine dependence: Secondary | ICD-10-CM

## 2022-01-31 DIAGNOSIS — Z91018 Allergy to other foods: Secondary | ICD-10-CM | POA: Diagnosis not present

## 2022-01-31 DIAGNOSIS — F840 Autistic disorder: Secondary | ICD-10-CM | POA: Diagnosis present

## 2022-01-31 DIAGNOSIS — Z9151 Personal history of suicidal behavior: Secondary | ICD-10-CM | POA: Diagnosis not present

## 2022-01-31 DIAGNOSIS — Z8249 Family history of ischemic heart disease and other diseases of the circulatory system: Secondary | ICD-10-CM

## 2022-01-31 DIAGNOSIS — Z8489 Family history of other specified conditions: Secondary | ICD-10-CM

## 2022-01-31 DIAGNOSIS — F603 Borderline personality disorder: Secondary | ICD-10-CM | POA: Diagnosis not present

## 2022-01-31 DIAGNOSIS — Z820 Family history of epilepsy and other diseases of the nervous system: Secondary | ICD-10-CM

## 2022-01-31 DIAGNOSIS — Z91013 Allergy to seafood: Secondary | ICD-10-CM | POA: Diagnosis not present

## 2022-01-31 DIAGNOSIS — Z91030 Bee allergy status: Secondary | ICD-10-CM

## 2022-01-31 DIAGNOSIS — Z818 Family history of other mental and behavioral disorders: Secondary | ICD-10-CM | POA: Diagnosis not present

## 2022-01-31 DIAGNOSIS — R45851 Suicidal ideations: Secondary | ICD-10-CM | POA: Diagnosis present

## 2022-01-31 DIAGNOSIS — Z833 Family history of diabetes mellitus: Secondary | ICD-10-CM

## 2022-01-31 DIAGNOSIS — Z7951 Long term (current) use of inhaled steroids: Secondary | ICD-10-CM

## 2022-01-31 DIAGNOSIS — F250 Schizoaffective disorder, bipolar type: Secondary | ICD-10-CM

## 2022-01-31 DIAGNOSIS — Z808 Family history of malignant neoplasm of other organs or systems: Secondary | ICD-10-CM

## 2022-01-31 HISTORY — DX: Adjustment disorder with mixed anxiety and depressed mood: F43.23

## 2022-01-31 LAB — CBC WITH DIFFERENTIAL/PLATELET
Abs Immature Granulocytes: 0.04 10*3/uL (ref 0.00–0.07)
Basophils Absolute: 0 10*3/uL (ref 0.0–0.1)
Basophils Relative: 0 %
Eosinophils Absolute: 0.1 10*3/uL (ref 0.0–0.5)
Eosinophils Relative: 1 %
HCT: 42.8 % (ref 36.0–46.0)
Hemoglobin: 13.4 g/dL (ref 12.0–15.0)
Immature Granulocytes: 0 %
Lymphocytes Relative: 28 %
Lymphs Abs: 3.5 10*3/uL (ref 0.7–4.0)
MCH: 25.5 pg — ABNORMAL LOW (ref 26.0–34.0)
MCHC: 31.3 g/dL (ref 30.0–36.0)
MCV: 81.5 fL (ref 80.0–100.0)
Monocytes Absolute: 0.6 10*3/uL (ref 0.1–1.0)
Monocytes Relative: 5 %
Neutro Abs: 8.2 10*3/uL — ABNORMAL HIGH (ref 1.7–7.7)
Neutrophils Relative %: 66 %
Platelets: 293 10*3/uL (ref 150–400)
RBC: 5.25 MIL/uL — ABNORMAL HIGH (ref 3.87–5.11)
RDW: 14 % (ref 11.5–15.5)
WBC: 12.5 10*3/uL — ABNORMAL HIGH (ref 4.0–10.5)
nRBC: 0 % (ref 0.0–0.2)

## 2022-01-31 LAB — RESP PANEL BY RT-PCR (RSV, FLU A&B, COVID)  RVPGX2
Influenza A by PCR: NEGATIVE
Influenza B by PCR: NEGATIVE
Resp Syncytial Virus by PCR: NEGATIVE
SARS Coronavirus 2 by RT PCR: NEGATIVE

## 2022-01-31 LAB — COMPREHENSIVE METABOLIC PANEL
ALT: 15 U/L (ref 0–44)
AST: 18 U/L (ref 15–41)
Albumin: 4.6 g/dL (ref 3.5–5.0)
Alkaline Phosphatase: 65 U/L (ref 38–126)
Anion gap: 8 (ref 5–15)
BUN: 14 mg/dL (ref 6–20)
CO2: 23 mmol/L (ref 22–32)
Calcium: 9.7 mg/dL (ref 8.9–10.3)
Chloride: 108 mmol/L (ref 98–111)
Creatinine, Ser: 0.92 mg/dL (ref 0.44–1.00)
GFR, Estimated: >60 mL/min (ref >60)
Glucose, Bld: 92 mg/dL (ref 70–99)
Potassium: 3.8 mmol/L (ref 3.5–5.1)
Sodium: 139 mmol/L (ref 135–145)
Total Bilirubin: 0.4 mg/dL (ref 0.3–1.2)
Total Protein: 8.2 g/dL — ABNORMAL HIGH (ref 6.5–8.1)

## 2022-01-31 MED ORDER — MOMETASONE FURO-FORMOTEROL FUM 200-5 MCG/ACT IN AERO
2.0000 | INHALATION_SPRAY | Freq: Two times a day (BID) | RESPIRATORY_TRACT | Status: DC
  Administered 2022-01-31 – 2022-02-06 (×12): 2 via RESPIRATORY_TRACT
  Filled 2022-01-31: qty 8.8

## 2022-01-31 MED ORDER — MOMETASONE FURO-FORMOTEROL FUM 200-5 MCG/ACT IN AERO
2.0000 | INHALATION_SPRAY | Freq: Two times a day (BID) | RESPIRATORY_TRACT | Status: DC
  Administered 2022-01-31: 2 via RESPIRATORY_TRACT
  Filled 2022-01-31: qty 8.8

## 2022-01-31 MED ORDER — METOPROLOL SUCCINATE ER 25 MG PO TB24: 12.5000 mg | ORAL_TABLET | Freq: Every day | ORAL | Status: DC

## 2022-01-31 MED ORDER — ASENAPINE 3.8 MG/24HR TD PT24
3.8000 mg | MEDICATED_PATCH | Freq: Every day | TRANSDERMAL | Status: DC
  Filled 2022-01-31: qty 1

## 2022-01-31 MED ORDER — MELATONIN 3 MG PO TABS: 3.0000 mg | ORAL_TABLET | Freq: Every day | ORAL | Status: DC

## 2022-01-31 MED ORDER — ALUM & MAG HYDROXIDE-SIMETH 200-200-20 MG/5ML PO SUSP: 30.0000 mL | ORAL | Status: DC | PRN

## 2022-01-31 MED ORDER — METOPROLOL SUCCINATE 12.5 MG HALF TABLET
12.5000 mg | ORAL_TABLET | Freq: Every day | ORAL | Status: DC
  Administered 2022-02-01 – 2022-02-06 (×6): 12.5 mg via ORAL
  Filled 2022-01-31 (×11): qty 1

## 2022-01-31 MED ORDER — MELATONIN 3 MG PO TABS
3.0000 mg | ORAL_TABLET | Freq: Every day | ORAL | Status: DC
  Administered 2022-01-31 – 2022-02-02 (×3): 3 mg via ORAL
  Filled 2022-01-31 (×5): qty 1

## 2022-01-31 MED ORDER — IBUPROFEN 600 MG PO TABS
600.0000 mg | ORAL_TABLET | Freq: Three times a day (TID) | ORAL | Status: DC | PRN
  Administered 2022-01-31: 600 mg via ORAL
  Filled 2022-01-31: qty 1

## 2022-01-31 MED ORDER — ALBUTEROL SULFATE HFA 108 (90 BASE) MCG/ACT IN AERS
2.0000 | INHALATION_SPRAY | Freq: Four times a day (QID) | RESPIRATORY_TRACT | Status: DC | PRN
  Administered 2022-02-03 – 2022-02-04 (×2): 2 via RESPIRATORY_TRACT
  Filled 2022-01-31: qty 6.7

## 2022-01-31 MED ORDER — MAGNESIUM HYDROXIDE 400 MG/5ML PO SUSP
30.0000 mL | Freq: Every day | ORAL | Status: DC | PRN
  Administered 2022-02-02: 30 mL via ORAL
  Filled 2022-01-31: qty 30

## 2022-01-31 MED ORDER — HYDROXYZINE HCL 25 MG PO TABS
25.0000 mg | ORAL_TABLET | Freq: Three times a day (TID) | ORAL | Status: DC | PRN
  Administered 2022-01-31 – 2022-02-06 (×15): 25 mg via ORAL
  Filled 2022-01-31 (×16): qty 1

## 2022-01-31 NOTE — Tx Team (Signed)
Initial Treatment Plan 01/31/2022 4:59 PM [name] [id number]    PATIENT STRESSORS: Financial difficulties   Health problems     PATIENT STRENGTHS: Communication skills    PATIENT IDENTIFIED PROBLEMS: Suicidal ideation  Depression  Anxiety                 DISCHARGE CRITERIA:  Ability to meet basic life and health needs Improved stabilization in mood, thinking, and/or behavior  PRELIMINARY DISCHARGE PLAN: Outpatient therapy Return to previous living arrangement  PATIENT/FAMILY INVOLVEMENT: This treatment plan has been presented to and reviewed with the patient, [name].  The patient has been given the opportunity to ask questions and make suggestions.  Luna Glasgow, RN 01/31/2022, 4:59 PM

## 2022-01-31 NOTE — ED Provider Notes (Signed)
FBC/OBS ASAP Discharge Summary  Date and Time: 01/31/2022 11:22 AM  Name: [name]  MRN:  [id number]   Discharge Diagnoses:  Final diagnoses:  Suicidal ideation  Adjustment disorder with mixed anxiety and depressed mood  Schizoaffective disorder, bipolar type ([place])  Borderline personality disorder ([place])  PTSD (post-traumatic stress disorder)   Recent history per chart review:  [name] 31 yr. old female patient with psychiatric history of schizoaffective disorder bipolar type, borderline personality disorder, PTSD, as well as possible autism spectrum disorder.  She has had multiple psychiatric hospitalization as well as multiple suicide attempts via overdosing on home medications.  Most recent attempts 12/27/2021 admitted [facility] ICU after reporting she took six 40 mg Zofran pills, six 25 mg Atenolol pills, six 10 mg Cyclobenzaprine pills, eighteen 5 mg escitalopram pills, eighteen 10 mg Hydroxyzine pills, six 40 mg pantoprazole pills, six 1 mg Prazosin pills, eighteen 50 mg pregabalin pills, three 25 mg Quetiapine pills, and six 1.5 mg Vraylar pills in a suicide attempt to end her life.  Once medically stable psychiatric hospitalization at [facility] from 01/01/2022 to 01/11/2022.  Because of patients' history of multiple overdoses with intent to kill herself several medication changes were made to limit the amount of PO (pills) available for overdose use.  Patient was started on the Saphris patch 3.8 mg. Discontinued Inderal 5 mg and Seroquel 25 mg.  Vraylar 1.5 mg, and Lyrica 50 mg were not restarted.  Patient discharged home on following medication:  Melatonin 3 mg Q hs, Metoprolol succinate (Toprol XL) 12.5 mg daily, and [place] (Saphris) 3.8 mg/24 hr. patch.  Patient was given a seven-day supply of medications and was to follow up with her primary psychiatrist and primary doctor for medication management.  Current HPI:  01/30/2022 patient  presented to [facility] via [facility] with complaints passive suicidal ideation with no intent or plan but unable to contract for safety.  Reporting that her Arman Filter and Rush Barer had been discontinued during her psychiatric hospitalization at [facility] in 10/2021 after an overdose on medication.  Stating that she again overdosed and was sent to [facility] where she was put on a patch and doesn't feel that medications are working.    Patient seen face to face by this provider, consulted with Dr. Hampton Abbot; and chart reviewed on 01/31/22.  On evaluation [name] reports that she wants to be restarted on her Vraylar and her Lyrica.  States that the patch is not working and that she continues to have headaches, and  manic behavior.  "You can call Jeanie my CST and she can tell you everything that is going on."  Currently patient denies homicidal ideation, psychosis, and paranoia but continues to endorse passive suicidal thoughts with no intent or plan but is unable to contract for safety.  Related to patients significant history of multiple suicide attempts she is recommended for inpatient psychiatric treatment.     Collateral Information gathered by [place] [employer] (care management) who spoke with [name] member of patients CST program and per note:  Writer contact her CST worker [name] at [phone]).  Per [name], the patient has been displaying manic behaviors, had a headache for the past two week and upset stomach due to the change in her medication.   Per chart review, due to her history of multiple overdoses on p.o. medications. Patient was started on the Saphris 3.8 mg transdermal patch for psychosis and mood stabilization during her recent [facility]  admission from December 7 to 12, 2023.   Per [name], the psychiatrist with the CST program is on vacation and has not been able to see the patient.   Patient has been accepted to [place] for inpatient psychiatric treatment.     Total  Time spent with patient: 30 minutes  Past Psychiatric History: Per Chart review history includes schizoaffective disorder bipolar type, borderline personality disorder, PTSD, as well as possible autism spectrum disorder  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 fragile X syndrome    Chronic constipation    Depression    Drug-seeking behavior    Essential tremor    Headache    Overdose of acetaminophen 07/2017   and other meds   Personality disorder ([place])    Schizo-affective psychosis ([place])    Schizoaffective disorder, bipolar type ([place])    Seizures ([place])    Last seizure December 2017   Sleep apnea     Past Surgical History:  Procedure Laterality Date   MOUTH SURGERY  2009 or 2010   Family History:  Family History  Problem Relation Age of Onset   Mental illness Father    Asthma Father    PDD Brother    Seizures Brother    Family Psychiatric History: See above Social History:  Social History   Substance and Sexual Activity  Alcohol Use No   Alcohol/week: 1.0 standard drink of alcohol   Types: 1 Standard drinks or equivalent per week   Comment: denies at this time     Social History   Substance and Sexual Activity  Drug Use No   Comment: History of cocaine use at age 32 for 4 months    Social History   Socioeconomic History   Marital status: Widowed    Spouse name: Not on file   Number of children: 0   Years of education: Not on file   Highest education level: Not on file  Occupational History   Occupation: disability  Tobacco Use   Smoking status: Former    Packs/day: 0.00    Types: Cigarettes   Smokeless tobacco: Never   Tobacco comments:    Smoked for 2  years age [age]  Vaping Use   Vaping Use: Never used  Substance and Sexual Activity   Alcohol use: No    Alcohol/week: 1.0 standard drink of alcohol    Types: 1 Standard drinks or  equivalent per week    Comment: denies at this time   Drug use: No    Comment: History of cocaine use at age 22 for 4 months   Sexual activity: Not Currently    Birth control/protection: None  Other Topics Concern   Not on file  Social History Narrative   Marital status: Widowed      Children: daughter      Lives: with boyfriend, in two story home      Employment:  Disability      Tobacco: quit smoking; smoked for two years.      Alcohol ;none      Drugs: none   Has not traveled outside of the country.   Right handed         Social Determinants of Health   Financial Resource Strain: Not on file  Food Insecurity: No Food Insecurity (01/01/2022)   Hunger  Vital Sign    Worried About Charity fundraiser in the Last Year: Never true    Ran Out of Food in the Last Year: Never true  Recent Concern: [place] Present (12/28/2021)   Hunger Vital Sign    Worried About Running Out of Food in the Last Year: Often true    Ran Out of Food in the Last Year: Often true  Transportation Needs: Unmet Transportation Needs (01/01/2022)   PRAPARE - Hydrologist (Medical): No    Lack of Transportation (Non-Medical): Yes  Physical Activity: Not on file  Stress: Not on file  Social Connections: Not on file   SDOH:  [place]: No Food Insecurity (01/01/2022)  Recent Concern: [place] Present (12/28/2021)  Housing: High Risk (01/01/2022)  Transportation Needs: Unmet Transportation Needs (01/01/2022)  Utilities: Not At Risk (01/01/2022)  Recent Concern: Utilities - At Risk (12/28/2021)  Alcohol Screen: Low Risk  (01/01/2022)  Depression (PHQ2-9): Medium Risk (06/27/2021)  Tobacco Use: Medium Risk (01/31/2022)    Tobacco Cessation:  N/A, patient does not currently use tobacco products  Current Medications:  Current Facility-Administered Medications  Medication Dose Route Frequency Provider  Last Rate Last Admin   albuterol (VENTOLIN HFA) 108 (90 Base) MCG/ACT inhaler 2 puff  2 puff Inhalation Q6H PRN Onuoha, Chinwendu V, NP       alum & mag hydroxide-simeth (MAALOX/MYLANTA) 200-200-20 MG/5ML suspension 30 mL  30 mL Oral Q4H PRN Onuoha, Chinwendu V, NP       Asenapine PT24 3.8 mg  3.8 mg Transdermal Daily Fender Herder B, NP       hydrOXYzine (ATARAX) tablet 25 mg  25 mg Oral TID PRN Onuoha, Chinwendu V, NP   25 mg at 01/30/22 2314   ibuprofen (ADVIL) tablet 600 mg  600 mg Oral TID PRN Darleny Sem B, NP   600 mg at 01/31/22 1021   magnesium hydroxide (MILK OF MAGNESIA) suspension 30 mL  30 mL Oral Daily PRN Onuoha, Chinwendu V, NP       melatonin tablet 3 mg  3 mg Oral QHS Zailyn Thoennes B, NP       [START ON 02/01/2022] metoprolol succinate (TOPROL-XL) 24 hr tablet 12.5 mg  12.5 mg Oral Daily Elenna Spratling B, NP       mometasone-formoterol (DULERA) 200-5 MCG/ACT inhaler 2 puff  2 puff Inhalation BID Drew Lips B, NP   2 puff at 01/31/22 1021   Current Outpatient Medications  Medication Sig Dispense Refill   albuterol (VENTOLIN HFA) 108 (90 Base) MCG/ACT inhaler Inhale 2 puffs into the lungs every 6 (six) hours as needed for wheezing or shortness of breath.     budesonide-formoterol (SYMBICORT) 160-4.5 MCG/ACT inhaler Inhale 2 puffs into the lungs 2 (two) times daily.     metoprolol succinate (TOPROL-XL) 25 MG 24 hr tablet Take 0.5 tablets (12.5 mg total) by mouth daily for 7 doses. 3.5 tablet 0   ondansetron (ZOFRAN-ODT) 4 MG disintegrating tablet Take 1 tablet (4 mg total) by mouth every 8 (eight) hours as needed for nausea or vomiting. 20 tablet 0    PTA Medications: (Not in a hospital admission)      06/27/2021    3:18 AM 05/01/2021    2:16 AM 02/03/2020    2:24 AM  Depression screen PHQ 2/9  Decreased Interest '1 2 3  '$ Down, Depressed, Hopeless '2 3 3  '$ PHQ - 2  Score '3 5 6  '$ Altered sleeping 0 0 3  Tired, decreased energy '1 1 3  '$ Change in appetite 0 1 3  Feeling  bad or failure about yourself  '1 3 3  '$ Trouble concentrating '2 3 3  '$ Moving slowly or fidgety/restless '1 1 3  '$ Suicidal thoughts '1 3 3  '$ PHQ-9 Score '9 17 27  '$ Difficult doing work/chores Somewhat difficult Extremely dIfficult     Flowsheet Row ED from 01/30/2022 in [facility] ED from 01/21/2022 in [place] ED from 01/18/2022 in [place] High Risk No Risk No Risk       Musculoskeletal  Strength & Muscle Tone: within normal limits Gait & Station: normal Patient leans: N/A  Psychiatric Specialty Exam  Presentation  General Appearance:  Appropriate for Environment  Eye Contact: Good  Speech: Clear and Coherent; Pressured  Speech Volume: Normal  Handedness: Right   Mood and Affect  Mood: Anxious; Depressed  Affect: Congruent   Thought Process  Thought Processes: Coherent; Goal Directed  Descriptions of Associations:Intact  Orientation:Full (Time, Place and Person)  Thought Content:Logical; WDL  Diagnosis of Schizophrenia or Schizoaffective disorder in past: Yes  Duration of Psychotic Symptoms: Greater than six months   Hallucinations:Hallucinations: None (Denies at this time)  Ideas of Reference:None  Suicidal Thoughts:Suicidal Thoughts: Yes, Passive SI Active Intent and/or Plan: Without Intent; Without Plan (Reporting that she doesn't have a specific plan at this time but unable to contract for safety.  History of multiple suicide attempts) SI Passive Intent and/or Plan: With Plan  Homicidal Thoughts:Homicidal Thoughts: No   Sensorium  Memory: Immediate Good; Recent Good; Remote Good  Judgment: Poor  Insight: Present; Fair   Community education officer  Concentration: Good  Attention Span: Good  Recall: Roel Cluck of Knowledge: Good  Language: Good   Psychomotor Activity  Psychomotor Activity: Psychomotor  Activity: Normal   Assets  Assets: Communication Skills; Desire for Improvement; Financial Resources/Insurance; Housing; Leisure Time; Social Support   Sleep  Sleep: Sleep: Fair   Nutritional Assessment (For OBS and FBC admissions only) Has the patient had a weight loss or gain of 10 pounds or more in the last 3 months?: No Has the patient had a decrease in food intake/or appetite?: No Does the patient have dental problems?: No Does the patient have eating habits or behaviors that may be indicators of an eating disorder including binging or inducing vomiting?: No Has the patient recently lost weight without trying?: 0 Has the patient been eating poorly because of a decreased appetite?: 0 Malnutrition Screening Tool Score: 0    Physical Exam  Physical Exam Constitutional:      General: She is not in acute distress.    Appearance: She is not diaphoretic.  HENT:     Head: Normocephalic.  Eyes:     Conjunctiva/sclera: Conjunctivae normal.  Cardiovascular:     Rate and Rhythm: Normal rate and regular rhythm.  Pulmonary:     Effort: No respiratory distress.     Breath sounds: Normal breath sounds.  Chest:     Chest wall: No tenderness.  Skin:    General: Skin is warm and dry.  Neurological:     Mental Status: She is alert and oriented to person, place, and time.  Psychiatric:        Attention and Perception: Attention and perception normal.        Mood and Affect: Mood  is anxious and depressed.        Speech: Speech is rapid and pressured.        Behavior: Behavior is cooperative.        Thought Content: Thought content is not paranoid or delusional. Thought content includes suicidal ideation. Thought content does not include homicidal (passive) ideation. Thought content does not include homicidal or suicidal plan.        Cognition and Memory: Cognition normal.        Judgment: Judgment is impulsive.    Review of Systems  Constitutional:  Negative for chills,  diaphoresis and fever.  HENT:  Negative for congestion.   Eyes:  Negative for discharge.  Respiratory:  Negative for cough, shortness of breath and wheezing.   Cardiovascular:  Negative for chest pain and palpitations.  Gastrointestinal:  Negative for diarrhea, nausea and vomiting.  Neurological:  Negative for tingling, seizures, loss of consciousness, weakness and headaches.  Psychiatric/Behavioral:  Positive for depression and suicidal ideas (Passive thoughts at this time with no intent or plan but unable to contract for safety). Negative for hallucinations and substance abuse. The patient is nervous/anxious.    Blood pressure 137/87, pulse 100, temperature 98.4 F (36.9 C), resp. rate 18, SpO2 98 %. There is no height or weight on file to calculate BMI.   Disposition: Inpatient psychiatric treatment.  Accepted to [facility].  Accepting physician Nadir Attiah Bed/room:  301-2   Yomayra Tate, NP 01/31/2022, 11:22 AM

## 2022-01-31 NOTE — Progress Notes (Addendum)
Pt was accepted to [facility] 01/31/2022, pending voluntary consent faxed to [phone]. Bed assignment: 301-2  Pt meets inpatient criteria per Earleen Newport, NP  Attending Physician will be Dr. Winfred Leeds, MD  Report can be called to: Adult unit: [phone]  Pt can arrive after 12 PM  Care Team Notified: [facility] Scharlene Gloss, RN, Lynnda Shields, RN, Earleen Newport, NP, Anda Latina, RN, and [address], LCSWA  [place], [place]  01/31/2022 10:14 AM

## 2022-01-31 NOTE — ED Notes (Signed)
Patient A&Ox4. Patient reports SI without plan or intent. Patient denies HI and AVH. Patient denies any physical complaints when asked. No acute distress noted. Support and encouragement provided. Routine safety checks conducted according to facility protocol. Encouraged patient to notify staff if thoughts of harm toward self or others arise. Patient verbalize understanding and agreement. Will continue to monitor for safety.

## 2022-01-31 NOTE — ED Notes (Signed)
Safe Transport called for pick up to [facility].

## 2022-01-31 NOTE — Progress Notes (Signed)
Pt is a 31 y.o. female voluntarily admitted to 301-2 for SI and "withdrawals from being taken off all my oral medications the last time I was here (at [facility])."  Pt reported for the last two weeks she has had "a horrible headache, nausea, vomiting, hyopmania, and dizziness because I was taken off all of my oral meds."  Pt said  her [employer] Team said that pt needed to go back to [place] and have oral medications restarted.  Pt presents with SI on admission, but stated that she has no plan and that she contracts for safety on the unit.

## 2022-01-31 NOTE — ED Notes (Signed)
Patient resting quietly in bed with eyes closed. Respirations equal and unlabored, skin warm and dry, NAD. Routine safety checks conducted according to facility protocol. Will continue to monitor for safety.  

## 2022-01-31 NOTE — ED Notes (Signed)
Patient discharge via ambulatory with a steady gait with Safe Transport staff member. Respirations equal and unlabored, skin warm and dry. No acute distress noted. Pt belongings returned to patient from locker #  11 intact. Patient escorted to lobby via staff for transport to destination. Safety maintained.

## 2022-01-31 NOTE — ED Notes (Signed)
Patient resting quietly in bed with eyes closed, Respirations equal and unlabored, skin warm and dry. Routine safety checks conducted according to facility protocol. Will continue to monitor for safety and update as needed.

## 2022-01-31 NOTE — Care Management (Signed)
OBS Care Management   Writer contact her CST worker [name] at [phone]).  Per [name], the patient has been displaying manic behaviors, had a headache for the past two week and upset stomach due to the change in her medication.   Per chart review, due to her history of multiple overdoses on p.o. medications. Patient was started on the Saphris 3.8 mg transdermal patch for psychosis and mood stabilization during her recent [facility] admission from December 7 to 12, 2023.   Per [name], the psychiatrist with the CST program is on vacation and has not been able to see the patient.

## 2022-01-31 NOTE — Plan of Care (Signed)
  Problem: Education: Goal: Emotional status will improve Outcome: Not Progressing Goal: Mental status will improve Outcome: Not Progressing   Problem: Coping: Goal: Ability to verbalize frustrations and anger appropriately will improve Outcome: Not Progressing

## 2022-01-31 NOTE — Progress Notes (Signed)
     01/31/22 2128  Psych Admission Type (Psych Patients Only)  Admission Status Voluntary  Psychosocial Assessment  Patient Complaints Anxiety;Depression;Self-harm thoughts  Eye Contact Intense  Facial Expression Anxious  Affect Anxious  Speech Rapid  Interaction Assertive  Motor Activity Fidgety  Appearance/Hygiene Unremarkable  Behavior Characteristics Cooperative  Mood Anxious  Thought Process  Coherency WDL  Content WDL  Delusions None reported or observed  Perception WDL  Hallucination None reported or observed  Judgment WDL  Confusion None  Danger to Self  Current suicidal ideation? Passive  Agreement Not to Harm Self Yes  Description of Agreement verbal  Danger to Others  Danger to Others None reported or observed  Danger to Others Abnormal  Harmful Behavior to others No threats or harm toward other people  Destructive Behavior No threats or harm toward property

## 2022-01-31 NOTE — Group Note (Signed)
Date:  01/31/2022 Time:  2:34 PM  Group Topic/Focus:  Self Care:   The focus of this group is to help patients understand the importance of self-care in order to improve or restore emotional, physical, spiritual, interpersonal, and financial health.    Participation Level:  Active  Participation Quality:  Appropriate  Affect:  Appropriate  Cognitive:  Appropriate  Insight: Appropriate  Engagement in Group:  Engaged  Modes of Intervention:  Discussion  Additional Comments:  We Went over self-Encouragement handouts.  Garvin Fila 01/31/2022, 2:34 PM

## 2022-02-01 DIAGNOSIS — F250 Schizoaffective disorder, bipolar type: Secondary | ICD-10-CM | POA: Diagnosis not present

## 2022-02-01 MED ORDER — CLONAZEPAM 0.5 MG PO TABS
0.5000 mg | ORAL_TABLET | Freq: Once | ORAL | Status: AC
  Administered 2022-02-01: 0.5 mg via ORAL
  Filled 2022-02-01: qty 1

## 2022-02-01 NOTE — Progress Notes (Addendum)
D: Pt reports passive SI this morning. Pt verbally contracts for safety. Pt denies HI/AVH. Pt has been irritable throughout the shift, complaining of withdrawal symptoms due to her not being able to take her oral medications anymore. Pt complains of nausea but refuses Zofran or Ginger Ale to help. Pt also complains of a headache but does not want pain medication. Patient refuses to eat meals and has remained in bed throughout the shift. Patient continuously approaches nurse and states "You guys aren't helping me here you make me worse I don't know why I came here, my care team is mad at you all for doing this to me". Pt complains of a tooth ache, RN offered and provided pt with a heat pack for tooth pain.  A: RN provided support and encouragement to patient. Pt given scheduled medications as prescribed. Q15 min checks verified for safety.    R: Patient verbally contracts for safety. Patient compliant with medications and treatment plan. Pt is safe on the unit.   02/01/22 1008  Psych Admission Type (Psych Patients Only)  Admission Status Voluntary  Psychosocial Assessment  Patient Complaints Anxiety;Depression;Self-harm thoughts  Eye Contact Intense  Facial Expression Anxious  Affect Anxious  Speech Rapid  Interaction Assertive  Motor Activity Fidgety  Appearance/Hygiene Unremarkable  Behavior Characteristics Cooperative  Mood Anxious  Thought Process  Coherency WDL  Content WDL  Delusions None reported or observed  Perception WDL  Hallucination None reported or observed  Judgment WDL  Confusion None  Danger to Self  Current suicidal ideation? Passive  Self-Injurious Behavior Some self-injurious ideation observed or expressed.  No lethal plan expressed   Agreement Not to Harm Self Yes  Description of Agreement Pt verbally contracts for safety  Danger to Others  Danger to Others None reported or observed  Danger to Others Abnormal  Harmful Behavior to others No threats or harm  toward other people  Destructive Behavior No threats or harm toward property

## 2022-02-01 NOTE — [facility] Suicide Risk Assessment (Signed)
Suicide Risk Assessment  Admission Assessment    [facility] Admission Suicide Risk Assessment   Nursing information obtained from:  Patient  Demographic factors:  Caucasian, Living alone, Unemployed  Current Mental Status:  Suicidal ideation indicated by patient  Loss Factors:  Decline in physical health  Historical Factors:  Prior suicide attempts  Risk Reduction Factors:  Sense of responsibility to family  Total Time spent with patient: 1 hour  Principal Problem: Schizoaffective disorder, bipolar type ([place])  Diagnosis:  Principal Problem:   Schizoaffective disorder, bipolar type ([place])  Subjective Data: See H&P.  Continued Clinical Symptoms:  Alcohol Use Disorder Identification Test Final Score (AUDIT): 0 The "Alcohol Use Disorders Identification Test", Guidelines for Use in Primary Care, Second Edition.  World Pharmacologist [facility]). Score between 0-7:  no or low risk or alcohol related problems. Score between 8-15:  moderate risk of alcohol related problems. Score between 16-19:  high risk of alcohol related problems. Score 20 or above:  warrants further diagnostic evaluation for alcohol dependence and treatment.  CLINICAL FACTORS:   Bipolar Disorder:   Mixed State Previous Psychiatric Diagnoses and Treatments  Musculoskeletal: Strength & Muscle Tone: within normal limits Gait & Station: normal Patient leans: N/A  Psychiatric Specialty Exam:  Presentation  General Appearance:  Appropriate for Environment  Eye Contact: Good  Speech: Clear and Coherent; Pressured  Speech Volume: Normal  Handedness: Right   Mood and Affect  Mood: Anxious; Depressed  Affect: Congruent   Thought Process  Thought Processes: Coherent; Goal Directed  Descriptions of Associations:Intact  Orientation:Full (Time, Place and Person)  Thought Content:Logical; WDL  History of Schizophrenia/Schizoaffective disorder:Yes  Duration of Psychotic Symptoms:Greater than six  months  Hallucinations:Hallucinations: None (Denies at this time)  Ideas of Reference:None  Suicidal Thoughts:Suicidal Thoughts: Yes, Passive SI Active Intent and/or Plan: Without Intent; Without Plan (Reporting that she doesn't have a specific plan at this time but unable to contract for safety.  History of multiple suicide attempts)  Homicidal Thoughts:Homicidal Thoughts: No   Sensorium  Memory: Immediate Good; Recent Good; Remote Good  Judgment: Poor  Insight: Present; Fair   Community education officer  Concentration: Good  Attention Span: Good  Recall: Roel Cluck of Knowledge: Good  Language: Good   Psychomotor Activity  Psychomotor Activity: Psychomotor Activity: Normal   Assets  Assets: Communication Skills; Desire for Improvement; Financial Resources/Insurance; Housing; Leisure Time; Social Support   Sleep  Sleep: Sleep: Fair    Physical Exam: See H&P.  Blood pressure 115/84, pulse (!) 101, temperature 98.2 F (36.8 C), resp. rate 18, height '5\' 4"'$  (1.626 m), weight 90 kg, SpO2 100 %. Body mass index is 34.06 kg/m.  COGNITIVE FEATURES THAT CONTRIBUTE TO RISK:  Closed-mindedness, Loss of executive function, Polarized thinking, and Thought constriction (tunnel vision)    SUICIDE RISK:   Minimal: No identifiable suicidal ideation.  Patients presenting with no risk factors but with morbid ruminations; may be classified as minimal risk based on the severity of the depressive symptoms  PLAN OF CARE: See H&P.  I certify that inpatient services furnished can reasonably be expected to improve the patient's condition.   Lindell Spar, NP, pmhnp, fnp-bc. 02/01/2022, 3:03 PM

## 2022-02-01 NOTE — H&P (Signed)
Psychiatric Admission Assessment Adult  Patient Identification: [name]  MRN:  [id number]  Date of Evaluation:  02/01/2022  Chief Complaint:  Passive suicidal ideations.  Principal Diagnosis: Schizoaffective disorder, bipolar type ([place])  Diagnosis:  Principal Problem:   Schizoaffective disorder, bipolar type ([place])  History of Present Illness: This is one of numerous psychiatric admission/evaluations in this [facility] for this 31 year old Caucasian female with extensive hx of mental illness, suicdal ideations & attempts by overdose on her mental health medications. She was recently hospitalized, treated & stabled in this [facility] She was discharged with an outpatient psychiatric appointments for routine psychiatric care/medication management & Community support team ([name]). She is being admitted this time with complain of passive suicidal ideations without any plans or intent. [name] is seen in her room, she reports while rolling her eyes,   "My [name] says I'm going through too much stuff like nausea & headaches. I have tried Zofran, it did not help. I'm suppose to be on a special medicines for GERD, fibromyalgia & cardiac stuff. And for my psych medicines, I need to get back on Vraylar, Lexapro & Prazosin. That's about it. I'm not depressed right now, sort of & no, I'm not feeling anxious. Oh, I forgot Melatonin, it helps me sleep. I need to get back on it too".  Collateral information obtained, provided by [name] [phone]: [name] reports, "[name] has been experiencing manic state since the Christmas holidays. I'm her case worker/manager. It was very had trying to manage her symptoms on the Saphris patch. That medicine is not working. [name] has been presenting with pressured speech, rambled speech, rambling text that did not any sense, but not much psychosis reported. [name] cannot live like this. Her symptoms spiraled out of control since [facility] stopped her Vraylar.  She has expressed to [name] that the Arman Filter is the only medicine that works for her. Please put her back on Vraylar as we are working on the action plan to keep her safe. We know she does have hx of multiple overdose incidents, that is the reason we are working on this preventative action plan".  Associated Signs/Symptoms:  Depression Symptoms:  depressed mood, insomnia,  (Hypo) Manic Symptoms:  Labiality of Mood,  Anxiety Symptoms:  Excessive Worry,  Psychotic Symptoms:   Denies any hallucinations, delusions or paranoia.  PTSD Symptoms: NA  Total Time spent with patient: 1 hour  Past Psychiatric History: Schizoaffective disorder.  Is the patient at risk to self? No.  Has the patient been a risk to self in the past 6 months? Yes.    Has the patient been a risk to self within the distant past? Yes.    Is the patient a risk to others? No.  Has the patient been a risk to others in the past 6 months? No.  Has the patient been a risk to others within the distant past? No.   [name] Scale:  [place] Admission (Current) from 01/31/2022 in [facility] ED from 01/30/2022 in [facility] ED from 01/21/2022 in [facility] Low Risk High Risk No Risk        Prior Inpatient Therapy: Yes.   If yes, describe: [facility] x numerous times.  Prior Outpatient Therapy: Yes.   If yes, describe:   Alcohol Screening: 1. How often do you have a drink containing alcohol?: Never 2. How many drinks containing alcohol do you have  on a typical day when you are drinking?: 1 or 2 3. How often do you have six or more drinks on one occasion?: Never AUDIT-C Score: 0 4. How often during the last year have you found that you were not able to stop drinking once you had started?: Never 5. How often during the last year have you failed to do what was normally expected from you because of drinking?:  Never 6. How often during the last year have you needed a first drink in the morning to get yourself going after a heavy drinking session?: Never 7. How often during the last year have you had a feeling of guilt of remorse after drinking?: Never 8. How often during the last year have you been unable to remember what happened the night before because you had been drinking?: Never 9. Have you or someone else been injured as a result of your drinking?: No 10. Has a relative or friend or a doctor or another health worker been concerned about your drinking or suggested you cut down?: No Alcohol Use Disorder Identification Test Final Score (AUDIT): 0  Substance Abuse History in the last 12 months:  No.  Consequences of Substance Abuse: NA  Previous Psychotropic Medications: Yes   Psychological Evaluations: 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    Chronic constipation    Depression    Drug-seeking behavior    Essential tremor    Headache    Overdose of acetaminophen 07/2017   and other meds   Personality disorder ([place])    Schizo-affective psychosis ([place])    Schizoaffective disorder, bipolar type ([place])    Seizures ([place])    Last seizure December 2017   Sleep apnea     Past Surgical History:  Procedure Laterality Date   MOUTH SURGERY  2009 or 2010   Family History:  Family History  Problem Relation Age of Onset   Mental illness Father    Asthma Father    PDD Brother    Seizures Brother    Family Psychiatric  History:   Tobacco Screening:  Social History   Tobacco Use  Smoking Status Former   Packs/day: 0.00   Types: Cigarettes  Smokeless Tobacco Never  Tobacco Comments   Smoked for 2  years age [age]    [place] Tobacco Counseling     Are you interested in Tobacco Cessation Medications?  N/A, patient does not use tobacco  products Counseled patient on smoking cessation:  N/A, patient does not use tobacco products Reason Tobacco Screening Not Completed: No value filed.       Social History: Single, lives in [place], [place], unemployed Social History   Substance and Sexual Activity  Alcohol Use No   Alcohol/week: 1.0 standard drink of alcohol   Types: 1 Standard drinks or equivalent per week   Comment: denies at this time     Social History   Substance and Sexual Activity  Drug Use No   Comment: History of cocaine use at age 53 for 4 months    Additional Social History: Marital status: Widowed Widowed, when?: In 2018. Long term relationship, how long?: UTA What types of issues is patient dealing with in the relationship?: Pt reports, her husband committed suicide in 2018. Are you sexually active?: No What  is your sexual orientation?: "straight" Has your sexual activity been affected by drugs, alcohol, medication, or emotional stress?: "No" Does patient have children?: Yes How is patient's relationship with their children?: "My daughter is in the custody of my deceased husband mom".  Allergies:   Allergies  Allergen Reactions   Bee Venom Anaphylaxis   Coconut Flavor Anaphylaxis and Rash   Fish Allergy Anaphylaxis   Geodon [Ziprasidone Hcl] Other (See Comments)    Pt states that this medication causes paralysis of the mouth.     Haloperidol And Related Other (See Comments)    Pt states that this medication causes paralysis of the mouth, jaw locks up   Lithobid [Lithium] Other (See Comments)    Seizure-like activity    Roxicodone [Oxycodone] Other (See Comments)    Hallucinations    Seroquel [Quetiapine] Other (See Comments)    Severe drowsiness Pt currently taking 12.5 mg BID, she is ok with this dose   Shellfish Allergy Anaphylaxis   Phenergan [Promethazine Hcl] Other (See Comments)    Chest pain     Prilosec [Omeprazole] Nausea And Vomiting    Pt can take protonix with no  problems    Sulfa Antibiotics Other (See Comments)    Chest pain    Tegretol [Carbamazepine] Nausea And Vomiting   Prozac [Fluoxetine] Other (See Comments)    Increased Depression Suicidal thoughts   Tape Other (See Comments)    Skin tears, can only tolerate paper tape.   Tylenol [Acetaminophen] Other (See Comments)    Unknown reaction Pt states she can not take   Lab Results:  Results for orders placed or performed during the hospital encounter of 01/30/22 (from the past 48 hour(s))  CBC with Differential/Platelet     Status: Abnormal   Collection Time: 01/30/22 10:31 PM  Result Value Ref Range   WBC 12.5 (H) 4.0 - 10.5 K/uL   RBC 5.25 (H) 3.87 - 5.11 MIL/uL   Hemoglobin 13.4 12.0 - 15.0 g/dL   HCT 42.8 36.0 - 46.0 %   MCV 81.5 80.0 - 100.0 fL   MCH 25.5 (L) 26.0 - 34.0 pg   MCHC 31.3 30.0 - 36.0 g/dL   RDW 14.0 11.5 - 15.5 %   Platelets 293 150 - 400 K/uL   nRBC 0.0 0.0 - 0.2 %   Neutrophils Relative % 66 %   Neutro Abs 8.2 (H) 1.7 - 7.7 K/uL   Lymphocytes Relative 28 %   Lymphs Abs 3.5 0.7 - 4.0 K/uL   Monocytes Relative 5 %   Monocytes Absolute 0.6 0.1 - 1.0 K/uL   Eosinophils Relative 1 %   Eosinophils Absolute 0.1 0.0 - 0.5 K/uL   Basophils Relative 0 %   Basophils Absolute 0.0 0.0 - 0.1 K/uL   Immature Granulocytes 0 %   Abs Immature Granulocytes 0.04 0.00 - 0.07 K/uL    Comment: Performed at [facility], [address] [place], [place] [address]  Comprehensive metabolic panel     Status: Abnormal   Collection Time: 01/30/22 10:31 PM  Result Value Ref Range   Sodium 139 135 - 145 mmol/L   Potassium 3.8 3.5 - 5.1 mmol/L   Chloride 108 98 - 111 mmol/L   CO2 23 22 - 32 mmol/L   Glucose, Bld 92 70 - 99 mg/dL    Comment: Glucose reference range applies only to samples taken after fasting for at least 8 hours.   BUN 14 6 - 20 mg/dL   Creatinine,  Ser 0.92 0.44 - 1.00 mg/dL   Calcium 9.7 8.9 - 10.3 mg/dL   Total Protein 8.2 (H) 6.5 - 8.1 g/dL   Albumin  4.6 3.5 - 5.0 g/dL   AST 18 15 - 41 U/L   ALT 15 0 - 44 U/L   Alkaline Phosphatase 65 38 - 126 U/L   Total Bilirubin 0.4 0.3 - 1.2 mg/dL   GFR, Estimated >60 >60 mL/min    Comment: (NOTE) Calculated using the CKD-EPI Creatinine Equation (2021)    Anion gap 8 5 - 15    Comment: Performed at [facility] [address], [place],  [address]  Resp panel by RT-PCR (RSV, Flu A&B, Covid) Anterior Nasal Swab     Status: None   Collection Time: 01/30/22 10:32 PM   Specimen: Anterior Nasal Swab  Result Value Ref Range   SARS Coronavirus 2 by RT PCR NEGATIVE NEGATIVE    Comment: (NOTE) SARS-CoV-2 target nucleic acids are NOT DETECTED.  The SARS-CoV-2 RNA is generally detectable in upper respiratory specimens during the acute phase of infection. The lowest concentration of SARS-CoV-2 viral copies this assay can detect is 138 copies/mL. A negative result does not preclude SARS-Cov-2 infection and should not be used as the sole basis for treatment or other patient management decisions. A negative result may occur with  improper specimen collection/handling, submission of specimen other than nasopharyngeal swab, presence of viral mutation(s) within the areas targeted by this assay, and inadequate number of viral copies(<138 copies/mL). A negative result must be combined with clinical observations, patient history, and epidemiological information. The expected result is Negative.  Fact Sheet for Patients:  EntrepreneurPulse.com.au  Fact Sheet for Healthcare Providers:  IncredibleEmployment.be  This test is no t yet approved or cleared by the [place] FDA and  has been authorized for detection and/or diagnosis of SARS-CoV-2 by FDA under an Emergency Use Authorization (EUA). This EUA will remain  in effect (meaning this test can be used) for the duration of the COVID-19 declaration under Section 564(b)(1) of the Act, 21 U.S.C.section  360bbb-3(b)(1), unless the authorization is terminated  or revoked sooner.       Influenza A by PCR NEGATIVE NEGATIVE   Influenza B by PCR NEGATIVE NEGATIVE    Comment: (NOTE) The Xpert Xpress SARS-CoV-2/FLU/RSV plus assay is intended as an aid in the diagnosis of influenza from Nasopharyngeal swab specimens and should not be used as a sole basis for treatment. Nasal washings and aspirates are unacceptable for Xpert Xpress SARS-CoV-2/FLU/RSV testing.  Fact Sheet for Patients: EntrepreneurPulse.com.au  Fact Sheet for Healthcare Providers: IncredibleEmployment.be  This test is not yet approved or cleared by the [place] FDA and has been authorized for detection and/or diagnosis of SARS-CoV-2 by FDA under an Emergency Use Authorization (EUA). This EUA will remain in effect (meaning this test can be used) for the duration of the COVID-19 declaration under Section 564(b)(1) of the Act, 21 U.S.C. section 360bbb-3(b)(1), unless the authorization is terminated or revoked.     Resp Syncytial Virus by PCR NEGATIVE NEGATIVE    Comment: (NOTE) Fact Sheet for Patients: EntrepreneurPulse.com.au  Fact Sheet for Healthcare Providers: IncredibleEmployment.be  This test is not yet approved or cleared by the [place] FDA and has been authorized for detection and/or diagnosis of SARS-CoV-2 by FDA under an Emergency Use Authorization (EUA). This EUA will remain in effect (meaning this test can be used) for the duration of the COVID-19 declaration under Section 564(b)(1) of the Act,  21 U.S.C. section 360bbb-3(b)(1), unless the authorization is terminated or revoked.  Performed at [facility], [place] [address], [place], [place] [address]   POC urine preg, ED     Status: None   Collection Time: 01/30/22 10:39 PM  Result Value Ref Range   Preg Test, Ur Negative Negative  POCT Urine Drug Screen - (I-Screen)      Status: None   Collection Time: 01/30/22 10:39 PM  Result Value Ref Range   POC Amphetamine UR None Detected NONE DETECTED (Cut Off Level 1000 ng/mL)   POC Secobarbital (BAR) None Detected NONE DETECTED (Cut Off Level 300 ng/mL)   POC Buprenorphine (BUP) None Detected NONE DETECTED (Cut Off Level 10 ng/mL)   POC Oxazepam (BZO) None Detected NONE DETECTED (Cut Off Level 300 ng/mL)   POC Cocaine UR None Detected NONE DETECTED (Cut Off Level 300 ng/mL)   POC Methamphetamine UR None Detected NONE DETECTED (Cut Off Level 1000 ng/mL)   POC Morphine None Detected NONE DETECTED (Cut Off Level 300 ng/mL)   POC Methadone UR None Detected NONE DETECTED (Cut Off Level 300 ng/mL)   POC Oxycodone UR None Detected NONE DETECTED (Cut Off Level 100 ng/mL)   POC Marijuana UR None Detected NONE DETECTED (Cut Off Level 50 ng/mL)  POC SARS Coronavirus 2 Ag     Status: None   Collection Time: 01/30/22 10:47 PM  Result Value Ref Range   SARSCOV2ONAVIRUS 2 AG NEGATIVE NEGATIVE    Comment: (NOTE) SARS-CoV-2 antigen NOT DETECTED.   Negative results are presumptive.  Negative results do not preclude SARS-CoV-2 infection and should not be used as the sole basis for treatment or other patient management decisions, including infection  control decisions, particularly in the presence of clinical signs and  symptoms consistent with COVID-19, or in those who have been in contact with the virus.  Negative results must be combined with clinical observations, patient history, and epidemiological information. The expected result is Negative.  Fact Sheet for Patients: HandmadeRecipes.com.cy  Fact Sheet for Healthcare Providers: FuneralLife.at  This test is not yet approved or cleared by the [place] FDA and  has been authorized for detection and/or diagnosis of SARS-CoV-2 by FDA under an Emergency Use Authorization (EUA).  This EUA will remain in effect (meaning  this test can be used) for the duration of  the COV ID-19 declaration under Section 564(b)(1) of the Act, 21 U.S.C. section 360bbb-3(b)(1), unless the authorization is terminated or revoked sooner.     Blood Alcohol level:  Lab Results  Component Value Date   ETH <10 10/29/2021   ETH <10 97/67/3419   Metabolic Disorder Labs:  Lab Results  Component Value Date   HGBA1C 5.0 10/07/2021   MPG 96.8 10/07/2021   MPG 96.8 09/02/2021   Lab Results  Component Value Date   PROLACTIN 66.2 (H) 03/02/2021   PROLACTIN 66.1 (H) 02/04/2020   Lab Results  Component Value Date   CHOL 175 12/10/2021   TRIG 145 12/10/2021   HDL 44 12/10/2021   CHOLHDL 4.0 12/10/2021   VLDL 29 12/10/2021   LDLCALC 102 (H) 12/10/2021   LDLCALC 74 10/07/2021   Current Medications: Current Facility-Administered Medications  Medication Dose Route Frequency Provider Last Rate Last Admin   albuterol (VENTOLIN HFA) 108 (90 Base) MCG/ACT inhaler 2 puff  2 puff Inhalation Q6H PRN Rankin, Shuvon B, NP       alum & mag hydroxide-simeth (MAALOX/MYLANTA) 200-200-20 MG/5ML suspension 30 mL  30 mL Oral  Q4H PRN Rankin, Shuvon B, NP       hydrOXYzine (ATARAX) tablet 25 mg  25 mg Oral TID PRN Rankin, Shuvon B, NP   25 mg at 01/31/22 2128   magnesium hydroxide (MILK OF MAGNESIA) suspension 30 mL  30 mL Oral Daily PRN Rankin, Shuvon B, NP       melatonin tablet 3 mg  3 mg Oral QHS Rankin, Shuvon B, NP   3 mg at 01/31/22 2128   metoprolol succinate (TOPROL-XL) 24 hr tablet 12.5 mg  12.5 mg Oral Daily Rankin, Shuvon B, NP   12.5 mg at 02/01/22 1020   mometasone-formoterol (DULERA) 200-5 MCG/ACT inhaler 2 puff  2 puff Inhalation BID Rankin, Shuvon B, NP   2 puff at 02/01/22 0947   PTA Medications: Medications Prior to Admission  Medication Sig Dispense Refill Last Dose   albuterol (VENTOLIN HFA) 108 (90 Base) MCG/ACT inhaler Inhale 2 puffs into the lungs every 6 (six) hours as needed for wheezing or shortness of breath.       budesonide-formoterol (SYMBICORT) 160-4.5 MCG/ACT inhaler Inhale 2 puffs into the lungs 2 (two) times daily.      metoprolol succinate (TOPROL-XL) 25 MG 24 hr tablet Take 0.5 tablets (12.5 mg total) by mouth daily for 7 doses. 3.5 tablet 0    ondansetron (ZOFRAN-ODT) 4 MG disintegrating tablet Take 1 tablet (4 mg total) by mouth every 8 (eight) hours as needed for nausea or vomiting. 20 tablet 0    Musculoskeletal: Strength & Muscle Tone: within normal limits Gait & Station: normal Patient leans: N/A  Psychiatric Specialty Exam:  Presentation  General Appearance:  Appropriate for Environment  Eye Contact: Good  Speech: Clear and Coherent; Pressured  Speech Volume: Normal  Handedness: Right  Mood and Affect  Mood: Anxious; Depressed  Affect: Congruent  Thought Process  Thought Processes: Coherent; Goal Directed  Duration of Psychotic Symptoms: Greater than two weeks.  Past Diagnosis of Schizophrenia or Psychoactive disorder: Yes  Descriptions of Associations:Intact  Orientation:Full (Time, Place and Person)  Thought Content:Logical; WDL  Hallucinations:Hallucinations: None (Denies at this time)  Ideas of Reference:None  Suicidal Thoughts:Suicidal Thoughts: Yes, Passive SI Active Intent and/or Plan: Without Intent; Without Plan (Reporting that she doesn't have a specific plan at this time but unable to contract for safety.  History of multiple suicide attempts)  Homicidal Thoughts:Homicidal Thoughts: No  Sensorium  Memory: Immediate Good; Recent Good; Remote Good  Judgment: Poor  Insight: Present; Fair   Community education officer  Concentration: Good  Attention Span: Good  Recall: Roel Cluck of Knowledge: Good  Language: Good  Psychomotor Activity  Psychomotor Activity: Psychomotor Activity: Normal  Assets  Assets: Communication Skills; Desire for Improvement; Financial Resources/Insurance; Housing; Leisure Time; Social  Support  Sleep  Sleep: Sleep: Fair  Physical Exam: Physical Exam Vitals and nursing note reviewed.  HENT:     Nose: Nose normal.     Mouth/Throat:     Pharynx: Oropharynx is clear.  Eyes:     Pupils: Pupils are equal, round, and reactive to light.  Cardiovascular:     Rate and Rhythm: Normal rate.     Pulses: Normal pulses.  Pulmonary:     Effort: Pulmonary effort is normal.  Genitourinary:    Comments: Deferred Musculoskeletal:        General: Normal range of motion.     Cervical back: Normal range of motion.  Skin:    General: Skin is warm and dry.  Neurological:  General: No focal deficit present.     Mental Status: She is alert. Mental status is at baseline.    Review of Systems  Constitutional:  Negative for chills, diaphoresis and fever.  HENT:  Negative for congestion and sore throat.   Respiratory:  Negative for cough, shortness of breath and wheezing.   Cardiovascular:  Negative for chest pain and palpitations.  Gastrointestinal:  Positive for nausea. Negative for abdominal pain, constipation, diarrhea, heartburn and vomiting.  Genitourinary:  Negative for dysuria.  Musculoskeletal:  Negative for joint pain and myalgias.  Neurological:  Negative for dizziness, tingling, tremors, sensory change, speech change, focal weakness, seizures, loss of consciousness, weakness and headaches.  Endo/Heme/Allergies:        See allergy lists  Psychiatric/Behavioral:  Negative for depression and suicidal ideas.    Blood pressure 115/84, pulse (!) 101, temperature 98.2 F (36.8 C), resp. rate 18, height '5\' 4"'$  (1.626 m), weight 90 kg, SpO2 100 %. Body mass index is 34.06 kg/m.  Treatment Plan Summary: Daily contact with patient to assess and evaluate symptoms and progress in treatment and Medication management.   Diagnoses: Principal: Schizoaffective disorder.  Plan:  Safety and Monitoring: Voluntary admission to inpatient psychiatric unit for safety, stabilization  and treatment Daily contact with patient to assess and evaluate symptoms and progress in treatment Patient's case to be discussed in multi-disciplinary team meeting Observation Level : q15 minute checks Vital signs: q12 hours Precautions: Safety  Other PRNS -Continue Tylenol 650 mg every 6 hours PRN for mild pain -Continue Maalox 30 ml Q 4 hrs PRN for indigestion -Continue MOM 30 ml po Q 6 hrs for constipation  Discharge Planning: Social work and case management to assist with discharge planning and identification of hospital follow-up needs prior to discharge Estimated LOS: 5-7 days Discharge Concerns: Need to establish a safety plan; Medication compliance and effectiveness Discharge Goals: Return home with outpatient referrals for mental health follow-up including medication management/psychotherapy   Observation Level/Precautions:  15 minute checks  Laboratory:   Per ED  Psychotherapy: Enrolled in the group sessions.  Medications: See [facility]  Consultations: As needed.   Discharge Concerns: Safety, mood stability   Estimated LOS: 3-5 days  Other: NA    Physician Treatment Plan for Primary Diagnosis: Schizoaffective disorder, bipolar type ([place]) Long Term Goal(s): Improvement in symptoms so as ready for discharge  Short Term Goals: Ability to identify changes in lifestyle to reduce recurrence of condition will improve, Ability to verbalize feelings will improve, Ability to disclose and discuss suicidal ideas, and Ability to demonstrate self-control will improve  Physician Treatment Plan for Secondary Diagnosis: Principal Problem:   Schizoaffective disorder, bipolar type ([place])  Long Term Goal(s): Improvement in symptoms so as ready for discharge  Short Term Goals: Ability to identify and develop effective coping behaviors will improve, Ability to maintain clinical measurements within normal limits will improve, and Compliance with prescribed medications will improve  I certify  that inpatient services furnished can reasonably be expected to improve the patient's condition.    Lindell Spar, NP, pmhmp, fnp-bc 12/28/20232:21 PM

## 2022-02-01 NOTE — Group Note (Signed)
Date:  02/01/2022 Time:  10:03 AM  Group Topic/Focus:  Goals Group:   The focus of this group is to help patients establish daily goals to achieve during treatment and discuss how the patient can incorporate goal setting into their daily lives to aide in recovery.    Participation Level:  Did Not Attend  Participation Quality:      Affect:      Cognitive:      Insight: None  Engagement in Group:  Defensive and    Modes of Intervention:      Additional Comments:     Jerrye Beavers 02/01/2022, 10:03 AM

## 2022-02-01 NOTE — Plan of Care (Signed)
  Problem: Activity: Goal: Interest or engagement in activities will improve Outcome: Progressing   Problem: Education: Goal: Emotional status will improve Outcome: Not Progressing Goal: Mental status will improve Outcome: Not Progressing   
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
## 2022-02-01 NOTE — Progress Notes (Signed)
Adult Psychoeducational Group Note  Date:  02/01/2022 Time:  9:22 PM  Group Topic/Focus:  Wrap-Up Group:   The focus of this group is to help patients review their daily goal of treatment and discuss progress on daily workbooks.  Participation Level:  Active  Participation Quality:  Appropriate  Affect:  Appropriate  Cognitive:  Appropriate  Insight: Appropriate  Engagement in Group:  Engaged  Modes of Intervention:  Education and Exploration  Additional Comments:  Patient attended and participated in group tonight. She reports that today she learnt that she should be patience and do not overdose.  Salley Scarlet [facility] 02/01/2022, 9:22 PM

## 2022-02-01 NOTE — Progress Notes (Signed)
     02/01/22 2102  Psych Admission Type (Psych Patients Only)  Admission Status Voluntary  Psychosocial Assessment  Patient Complaints Anxiety;Depression;Self-harm thoughts  Eye Contact Intense  Facial Expression Anxious  Affect Apathetic  Speech Rapid  Interaction Assertive  Motor Activity Fidgety  Appearance/Hygiene Unremarkable  Behavior Characteristics Cooperative  Mood Anxious  Thought Process  Coherency WDL  Content WDL  Delusions None reported or observed  Perception WDL  Hallucination None reported or observed  Judgment WDL  Confusion None  Danger to Self  Current suicidal ideation? Passive  Self-Injurious Behavior Some self-injurious ideation observed or expressed.  No lethal plan expressed   Agreement Not to Harm Self Yes  Description of Agreement verbal  Danger to Others  Danger to Others None reported or observed  Danger to Others Abnormal  Harmful Behavior to others No threats or harm toward other people  Destructive Behavior No threats or harm toward property

## 2022-02-01 NOTE — Progress Notes (Signed)
  Administered PRN Hydroxyzine per MAR per pt request.

## 2022-02-02 ENCOUNTER — Encounter (HOSPITAL_COMMUNITY): Payer: Self-pay

## 2022-02-02 DIAGNOSIS — F250 Schizoaffective disorder, bipolar type: Secondary | ICD-10-CM | POA: Diagnosis not present

## 2022-02-02 MED ORDER — CARIPRAZINE HCL 1.5 MG PO CAPS
1.5000 mg | ORAL_CAPSULE | Freq: Every day | ORAL | Status: DC
  Administered 2022-02-02 – 2022-02-03 (×2): 1.5 mg via ORAL
  Filled 2022-02-02 (×5): qty 1

## 2022-02-02 MED ORDER — CLONAZEPAM 0.25 MG PO TBDP
0.2500 mg | ORAL_TABLET | Freq: Once | ORAL | Status: AC
  Administered 2022-02-02: 0.25 mg via ORAL
  Filled 2022-02-02: qty 1

## 2022-02-02 MED ORDER — IBUPROFEN 400 MG PO TABS
400.0000 mg | ORAL_TABLET | Freq: Four times a day (QID) | ORAL | Status: DC | PRN
  Administered 2022-02-02 – 2022-02-03 (×3): 400 mg via ORAL
  Filled 2022-02-02 (×3): qty 1

## 2022-02-02 MED ORDER — IBUPROFEN 400 MG PO TABS
400.0000 mg | ORAL_TABLET | ORAL | Status: AC
  Administered 2022-02-02: 400 mg via ORAL
  Filled 2022-02-02 (×2): qty 1

## 2022-02-02 MED ORDER — PANTOPRAZOLE SODIUM 20 MG PO TBEC
20.0000 mg | DELAYED_RELEASE_TABLET | Freq: Every day | ORAL | Status: DC
  Administered 2022-02-02 – 2022-02-04 (×3): 20 mg via ORAL
  Filled 2022-02-02 (×6): qty 1

## 2022-02-02 MED ORDER — BENZOCAINE 10 % MT GEL
Freq: Three times a day (TID) | OROMUCOSAL | Status: DC | PRN
  Administered 2022-02-02: 1 via OROMUCOSAL
  Filled 2022-02-02: qty 9

## 2022-02-02 MED ORDER — PRAZOSIN HCL 1 MG PO CAPS
1.0000 mg | ORAL_CAPSULE | Freq: Every day | ORAL | Status: DC
  Administered 2022-02-02 – 2022-02-05 (×4): 1 mg via ORAL
  Filled 2022-02-02 (×5): qty 1
  Filled 2022-02-02: qty 3

## 2022-02-02 MED ORDER — ESCITALOPRAM OXALATE 5 MG PO TABS
5.0000 mg | ORAL_TABLET | Freq: Every day | ORAL | Status: DC
  Administered 2022-02-02 – 2022-02-03 (×2): 5 mg via ORAL
  Filled 2022-02-02 (×5): qty 1

## 2022-02-02 NOTE — Progress Notes (Signed)
Adult Psychoeducational Group Note  Date:  02/02/2022 Time:  1:50 PM  Group Topic/Focus:  Orientation:   The focus of this group is to educate the patient on the purpose and policies of crisis stabilization and provide a format to answer questions about their admission.  The group details unit policies and expectations of patients while admitted.  Participation Level:  Active  Participation Quality:  Appropriate  Affect:  Appropriate  Cognitive:  Appropriate  Insight: Appropriate  Engagement in Group:  Engaged  Modes of Intervention:  Discussion  Additional Comments:  Pt attended the orientation group and remained appropriate and engaged throughout the duration of the group.   Sandi Mariscal O 02/02/2022, 1:50 PM

## 2022-02-02 NOTE — Progress Notes (Signed)
   02/02/22 2312  Psych Admission Type (Psych Patients Only)  Admission Status Voluntary  Psychosocial Assessment  Patient Complaints Anxiety  Eye Contact Fair  Facial Expression Animated  Affect Appropriate to circumstance  Speech Rapid  Interaction Assertive  Motor Activity Fidgety  Appearance/Hygiene Unremarkable  Behavior Characteristics Appropriate to situation  Mood Anxious  Thought Process  Coherency Concrete thinking  Content Blaming others  Delusions None reported or observed  Perception WDL  Hallucination None reported or observed  Judgment Limited  Confusion None  Danger to Self  Current suicidal ideation? Denies  Self-Injurious Behavior No self-injurious ideation or behavior indicators observed or expressed   Agreement Not to Harm Self Yes  Description of Agreement verbal  Danger to Others  Danger to Others None reported or observed  Danger to Others Abnormal  Harmful Behavior to others No threats or harm toward other people  Destructive Behavior No threats or harm toward property

## 2022-02-02 NOTE — Progress Notes (Signed)
[facility] MD Progress Note  02/02/2022 3:17 PM [name]  MRN:  [id number]  Reason for admission: 31 year old Caucasian female with extensive hx of mental illness, suicdal ideations & attempts by overdose on her mental health medications. She was recently hospitalized, treated & stabled in this [facility]. She was discharged with an outpatient psychiatric appointments for routine psychiatric care/medication management & Community support team (CST). She is being admitted this time with complain of passive suicidal ideations without any plans or intent.   Daily notes: [name] is seen in her room, chart reviewed. The chart findings discussed with the treatment team. She was observed lying down on the floor in front of her room crying hysterically saying, "I need my Lyrica for my fibromyalgia. I need my Vraylar, Lexapro, Protonix & Prazosin back. These are the medicines that has helped me in the past. I will not try to overdose on these medicines any more. The Saphris patch does not work for me. My mood is down today. I hurt all over, even my teeth is hurting me. I need the social worker to find me a dentist that will accept my medicaid. I'm not feeling like hurting myself today. I'm not hearing voices or seeing stuff. I just want to get back on my medicines please".  This case was discussed with the attending psychiatrist, he agreed on re-starting the patient on the requested medications. A collateral information obtained from the patient's CST case manager yesterday showed that the CST workers are expecting for patient to be put back on the above listed psychotropic medications as they feel it will help stabilize patient's current symptoms. The CST staff stated that patient has been highly manic with pressured speech & disorganized thinking since the holiday break. She stated that they are working on the action plan to help patient to not overdose on her oral medications any longer after discharge. Attempted to  reach the patient's CST case manager again to enquire how often they are planning to be meeting with patient on weekly basis. No answer, left a message/number to return my call. Patient has been visible on the unit in no apparent distress. See treatment plan below. Vital signs remain stable.    Principal Problem: Schizoaffective disorder, bipolar type ([place])  Diagnosis: Principal Problem:   Schizoaffective disorder, bipolar type ([place])  Total Time spent with patient:  35 minutes  Past Psychiatric History: Schizoaffective disorder, bipolar-type.  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 fragile X syndrome    Chronic constipation    Depression    Drug-seeking behavior    Essential tremor    Headache    Overdose of acetaminophen 07/2017   and other meds   Personality disorder ([place])    Schizo-affective psychosis ([place])    Schizoaffective disorder, bipolar type ([place])    Seizures ([place])    Last seizure December 2017   Sleep apnea     Past Surgical History:  Procedure Laterality Date   MOUTH SURGERY  2009 or 2010   Family History:  Family History  Problem Relation Age of Onset   Mental illness Father    Asthma Father    PDD Brother    Seizures Brother    Family Psychiatric  History: See H&P.  Social History:  Social History   Substance and Sexual Activity  Alcohol Use No   Alcohol/week: 1.0 standard drink of alcohol   Types: 1 Standard drinks or equivalent per week   Comment: denies at this time     Social History   Substance and Sexual Activity  Drug Use No   Comment: History of cocaine use at age 28 for 4 months    Social History   Socioeconomic History   Marital status: Widowed    Spouse name: Not on file   Number of children: 0   Years of education: Not on file   Highest education level: Not on file  Occupational History   Occupation:  disability  Tobacco Use   Smoking status: Former    Packs/day: 0.00    Types: Cigarettes   Smokeless tobacco: Never   Tobacco comments:    Smoked for 2  years age [age]  Vaping Use   Vaping Use: Never used  Substance and Sexual Activity   Alcohol use: No    Alcohol/week: 1.0 standard drink of alcohol    Types: 1 Standard drinks or equivalent per week    Comment: denies at this time   Drug use: No    Comment: History of cocaine use at age 44 for 4 months   Sexual activity: Not Currently    Birth control/protection: None  Other Topics Concern   Not on file  Social History Narrative   Marital status: Widowed      Children: daughter      Lives: with boyfriend, in two story home      Employment:  Disability      Tobacco: quit smoking; smoked for two years.      Alcohol ;none      Drugs: none   Has not traveled outside of the country.   Right handed         Social Determinants of Health   Financial Resource Strain: Not on file  Food Insecurity: No Food Insecurity (01/31/2022)   Hunger Vital Sign    Worried About Running Out of Food in the Last Year: Never true    Ran Out of Food in the Last Year: Never true  Recent Concern: Food Insecurity - Food Insecurity Present (12/28/2021)   Hunger Vital Sign    Worried About Charity fundraiser in the Last Year: Often true    Ran Out of Food in the Last Year: Often true  Transportation Needs: Unmet Transportation Needs (01/31/2022)   PRAPARE - Hydrologist (Medical): No    Lack of Transportation (Non-Medical): Yes  Physical Activity: Not on file  Stress: Not on file  Social Connections: Not on file   Additional Social History:   Sleep: Fair  Appetite:  Good  Current Medications: Current Facility-Administered Medications  Medication Dose Route Frequency Provider Last Rate Last Admin   albuterol (VENTOLIN HFA) 108 (90 Base) MCG/ACT inhaler 2 puff  2 puff Inhalation Q6H PRN Rankin, Shuvon B, NP        alum & mag hydroxide-simeth (MAALOX/MYLANTA) 200-200-20 MG/5ML suspension 30 mL  30 mL Oral Q4H PRN Rankin, Shuvon B, NP       benzocaine (ORAJEL) 10 % mucosal gel   Mouth/Throat TID PRN Lindell Spar I, NP   1 Application at 61/60/73 1258   cariprazine (VRAYLAR) capsule 1.5 mg  1.5 mg Oral Daily Jahad Old, Herbert Pun I, NP   1.5 mg at 02/02/22 1258   escitalopram (LEXAPRO) tablet 5 mg  5 mg Oral Daily Lindell Spar I,  NP   5 mg at 02/02/22 1258   hydrOXYzine (ATARAX) tablet 25 mg  25 mg Oral TID PRN Rankin, Shuvon B, NP   25 mg at 02/02/22 1511   magnesium hydroxide (MILK OF MAGNESIA) suspension 30 mL  30 mL Oral Daily PRN Rankin, Shuvon B, NP   30 mL at 02/02/22 1316   melatonin tablet 3 mg  3 mg Oral QHS Rankin, Shuvon B, NP   3 mg at 02/01/22 2102   metoprolol succinate (TOPROL-XL) 24 hr tablet 12.5 mg  12.5 mg Oral Daily Rankin, Shuvon B, NP   12.5 mg at 02/02/22 0742   mometasone-formoterol (DULERA) 200-5 MCG/ACT inhaler 2 puff  2 puff Inhalation BID Rankin, Shuvon B, NP   2 puff at 02/02/22 0743   pantoprazole (PROTONIX) EC tablet 20 mg  20 mg Oral Daily Yared Susan, Herbert Pun I, NP   20 mg at 02/02/22 1258   prazosin (MINIPRESS) capsule 1 mg  1 mg Oral QHS Kylan Liberati I, NP       Lab Results: No results found for this or any previous visit (from the past 48 hour(s)).  Blood Alcohol level:  Lab Results  Component Value Date   ETH <10 10/29/2021   ETH <10 78/29/5621   Metabolic Disorder Labs: Lab Results  Component Value Date   HGBA1C 5.0 10/07/2021   MPG 96.8 10/07/2021   MPG 96.8 09/02/2021   Lab Results  Component Value Date   PROLACTIN 66.2 (H) 03/02/2021   PROLACTIN 66.1 (H) 02/04/2020   Lab Results  Component Value Date   CHOL 175 12/10/2021   TRIG 145 12/10/2021   HDL 44 12/10/2021   CHOLHDL 4.0 12/10/2021   VLDL 29 12/10/2021   LDLCALC 102 (H) 12/10/2021   LDLCALC 74 10/07/2021   Physical Findings: AIMS:  , ,  ,  ,    CIWA:    COWS:     Musculoskeletal: Strength & Muscle  Tone: within normal limits Gait & Station: normal Patient leans: N/A  Psychiatric Specialty Exam:  Presentation  General Appearance:  Fairly Groomed; Casual  Eye Contact: Fair  Speech: Clear and Coherent; Pressured (Patient also rambles.)  Speech Volume: Normal  Handedness: Right   Mood and Affect  Mood: Anxious; Depressed; Dysphoric  Affect: Congruent; Constricted  Thought Process  Thought Processes: Disorganized  Descriptions of Associations:Intact  Orientation:Full (Time, Place and Person)  Thought Content:Illogical; Rumination; Perseveration; Tangential  History of Schizophrenia/Schizoaffective disorder:Yes  Duration of Psychotic Symptoms:Greater than six months  Hallucinations:Hallucinations: None Description of Auditory Hallucinations: NA  Ideas of Reference:None  Suicidal Thoughts:Suicidal Thoughts: No SI Active Intent and/or Plan: Without Intent; Without Plan; Without Means to Carry Out; Without Access to Means SI Passive Intent and/or Plan: Without Intent; Without Plan; Without Means to Carry Out; Without Access to Means  Homicidal Thoughts:No data recorded  Sensorium  Memory: Immediate Good; Recent Good; Remote Good  Judgment: Fair  Insight: Shallow   Executive Functions  Concentration: Fair  Attention Span: Fair  Recall: Good  Fund of Knowledge: -- (Limited)  Language: Fair  Psychomotor Activity  Psychomotor Activity:Psychomotor Activity: Normal   Assets  Assets: Communication Skills; Desire for Improvement; Financial Resources/Insurance; Housing; Resilience; Social Support  Sleep  Sleep:Sleep: Fair Number of Hours of Sleep: 5.5  Physical Exam:  Review of Systems  Constitutional:  Negative for chills and fever.  HENT:  Negative for congestion and sore throat.   Respiratory:  Negative for cough, shortness of breath and wheezing.   Cardiovascular:  Negative for chest pain and palpitations.  Gastrointestinal:   Negative for abdominal pain, constipation, diarrhea, heartburn, nausea and vomiting.  Genitourinary:  Negative for dysuria.  Musculoskeletal:  Negative for joint pain and myalgias.  Skin:  Negative for itching and rash.  Neurological:  Negative for dizziness, tingling, tremors, sensory change, speech change, focal weakness, seizures, loss of consciousness, weakness and headaches.  Endo/Heme/Allergies:        See allergy lists.  Psychiatric/Behavioral:  Negative for hallucinations, memory loss, substance abuse and suicidal ideas. The patient is nervous/anxious. The patient does not have insomnia.    Blood pressure 108/69, pulse 89, temperature 97.9 F (36.6 C), temperature source Oral, resp. rate 18, height '5\' 4"'$  (1.626 m), weight 90 kg, SpO2 99 %. Body mass index is 34.06 kg/m.  Treatment Plan Summary: Daily contact with patient to assess and evaluate symptoms and progress in treatment and Medication management.   Continue inpatient hospitalization.  Will continue today 02/02/2022 plan as below except where it is noted.   Principle diagnoses. Schizoaffective disorder, bipolar type ([place])  Plan:  -Initiated Vraylar 1.5 mg po daily for mood stabilization.  -Initiated Lexapro 5 mg po daily for depression. -Continue Vistaril 25 mg po tid prn for anxiety. -Continue Melatonin 3 mg po Q hs for insomnia.  -Initiated Prazosin 1 mg po Q bedtime for PTSD symptoms.  Other medical issues. -Initiated orajel 10% for teeth pain tid prn. -Ibuprofen 400 mg po Q 6 hrs prn for pain.  -TOPROL-XL 12.5 mg po daily for HTN.  -Dulera 200-5 MCG/ACT 2 puff bid for SOB. -Protonix 20 mg po Q am for GERD.  Other PRNS -Continue Tylenol 650 mg every 6 hours PRN for mild pain -Continue Maalox 30 ml Q 4 hrs PRN for indigestion -Continue MOM 30 ml po Q 6 hrs for constipation.   Safety and Monitoring: Voluntary admission to inpatient psychiatric unit for safety, stabilization and treatment Daily contact with  patient to assess and evaluate symptoms and progress in treatment Patient's case to be discussed in multi-disciplinary team meeting Observation Level : q15 minute checks Vital signs: q12 hours Precautions: Safety  Discharge Planning: Social work and case management to assist with discharge planning and identification of hospital follow-up needs prior to discharge Estimated LOS: 5-7 days Discharge Concerns: Need to establish a safety plan; Medication compliance and effectiveness Discharge Goals: Return home with outpatient referrals for mental health follow-up including medication management/psychotherapy  Lindell Spar, NP, pmhnp, fnp-bc 02/02/2022, 3:17 PM

## 2022-02-02 NOTE — Progress Notes (Addendum)
D: Pt reports passive SI this morning, no lethal plan expressed at this time. Pt verbally contracts for safety. Pt denied HI/AVH. Pt remains in an irritable mood, occasionally coming out from her room to complain to nurse about being here. Pt requested to sign 72-hours form at 1012. Pt complains of tooth pain and requests heat pack throughout the day.  A: RN provided support and encouragement to patient. Pt given scheduled medications as prescribed. Heat pack provided to pt for tooth pain. PRN Orajel provided for tooth pain. Q15 min checks verified for safety.    R: Patient verbally contracts for safety. Patient compliant with medications and treatment plan. Pt is safe on the unit.   02/02/22 0957  Psych Admission Type (Psych Patients Only)  Admission Status Voluntary  Psychosocial Assessment  Patient Complaints Anxiety;Depression;Self-harm thoughts  Eye Contact Intense  Facial Expression Angry  Affect Anxious;Irritable  Speech Rapid  Interaction Assertive;Hostile  Motor Activity Fidgety;Pacing  Appearance/Hygiene Unremarkable  Behavior Characteristics Agitated  Mood Irritable  Thought Process  Coherency Flight of ideas;Circumstantial  Content Blaming others;Preoccupation  Delusions None reported or observed  Perception WDL  Hallucination None reported or observed  Judgment Impaired  Confusion None  Danger to Self  Current suicidal ideation? Passive  Description of Suicide Plan Pt denies having a plan at this time  Self-Injurious Behavior Some self-injurious ideation observed or expressed.  No lethal plan expressed   Agreement Not to Harm Self Yes  Description of Agreement Pt verbally contracts for safety  Danger to Others  Danger to Others None reported or observed  Danger to Others Abnormal  Harmful Behavior to others No threats or harm toward other people  Destructive Behavior No threats or harm toward property

## 2022-02-02 NOTE — Progress Notes (Addendum)
  Administered PRN Hydroxyzine x 2 per [facility] per pt request.

## 2022-02-02 NOTE — Progress Notes (Signed)
Pt signed 72-hours request for discharge form on 02/02/22 at 1012.

## 2022-02-02 NOTE — Group Note (Signed)
Recreation Therapy Group Note   Group Topic:Stress Management  Group Date: 02/02/2022 Start Time: 0932 End Time: 0951 Facilitators: Ajee Heasley-McCall, LRT,CTRS Location: [address] Dayroom   Goal Area(s) Addresses:  Patient will identify positive stress management techniques. Patient will identify benefits of using stress management post d/c.  Group Description:  Meditation.  LRT played a meditation that focused on being grateful for the person in your life who has been supportive or made them feel good in tough times.  Patients were to it back, focus on their breathing and listen to the meditation as it played to engage in the meditation.     Affect/Mood: N/A   Participation Level: Did not attend    Clinical Observations/Individualized Feedback:     Plan: Continue to engage patient in RT group sessions 2-3x/week.   Darshana Curnutt-McCall, LRT,CTRS 02/02/2022 11:18 AM

## 2022-02-02 NOTE — BH IP Treatment Plan (Signed)
Interdisciplinary Treatment and Diagnostic Plan Update  02/02/2022 Time of Session: [place] MRN: [id number]  Principal Diagnosis: Schizoaffective disorder, bipolar type [facility])  Secondary Diagnoses: Principal Problem:   Schizoaffective disorder, bipolar type ([place])   Current Medications:  Current Facility-Administered Medications  Medication Dose Route Frequency Provider Last Rate Last Admin   albuterol (VENTOLIN HFA) 108 (90 Base) MCG/ACT inhaler 2 puff  2 puff Inhalation Q6H PRN Rankin, Shuvon B, NP       alum & mag hydroxide-simeth (MAALOX/MYLANTA) 200-200-20 MG/5ML suspension 30 mL  30 mL Oral Q4H PRN Rankin, Shuvon B, NP       hydrOXYzine (ATARAX) tablet 25 mg  25 mg Oral TID PRN Rankin, Shuvon B, NP   25 mg at 02/02/22 6789   magnesium hydroxide (MILK OF MAGNESIA) suspension 30 mL  30 mL Oral Daily PRN Rankin, Shuvon B, NP       melatonin tablet 3 mg  3 mg Oral QHS Rankin, Shuvon B, NP   3 mg at 02/01/22 2102   metoprolol succinate (TOPROL-XL) 24 hr tablet 12.5 mg  12.5 mg Oral Daily Rankin, Shuvon B, NP   12.5 mg at 02/02/22 3810   mometasone-formoterol (DULERA) 200-5 MCG/ACT inhaler 2 puff  2 puff Inhalation BID Rankin, Shuvon B, NP   2 puff at 02/02/22 0743   PTA Medications: Medications Prior to Admission  Medication Sig Dispense Refill Last Dose   albuterol (VENTOLIN HFA) 108 (90 Base) MCG/ACT inhaler Inhale 2 puffs into the lungs every 6 (six) hours as needed for wheezing or shortness of breath.      budesonide-formoterol (SYMBICORT) 160-4.5 MCG/ACT inhaler Inhale 2 puffs into the lungs 2 (two) times daily.      metoprolol succinate (TOPROL-XL) 25 MG 24 hr tablet Take 0.5 tablets (12.5 mg total) by mouth daily for 7 doses. 3.5 tablet 0    ondansetron (ZOFRAN-ODT) 4 MG disintegrating tablet Take 1 tablet (4 mg total) by mouth every 8 (eight) hours as needed for nausea or vomiting. 20 tablet 0     Patient Stressors: Financial difficulties   Health problems     Patient Strengths: Communication skills   Treatment Modalities: Medication Management, Group therapy, Case management,  1 to 1 session with clinician, Psychoeducation, Recreational therapy.   Physician Treatment Plan for Primary Diagnosis: Schizoaffective disorder, bipolar type ([place]) Long Term Goal(s): Improvement in symptoms so as ready for discharge   Short Term Goals: Ability to identify and develop effective coping behaviors will improve Ability to maintain clinical measurements within normal limits will improve Compliance with prescribed medications will improve Ability to identify changes in lifestyle to reduce recurrence of condition will improve Ability to verbalize feelings will improve Ability to disclose and discuss suicidal ideas Ability to demonstrate self-control will improve  Medication Management: Evaluate patient's response, side effects, and tolerance of medication regimen.  Therapeutic Interventions: 1 to 1 sessions, Unit Group sessions and Medication administration.  Evaluation of Outcomes: Progressing  Physician Treatment Plan for Secondary Diagnosis: Principal Problem:   Schizoaffective disorder, bipolar type ([place])  Long Term Goal(s): Improvement in symptoms so as ready for discharge   Short Term Goals: Ability to identify and develop effective coping behaviors will improve Ability to maintain clinical measurements within normal limits will improve Compliance with prescribed medications will improve Ability to identify changes in lifestyle to reduce recurrence of condition will improve Ability to verbalize feelings will improve Ability to disclose and discuss suicidal ideas Ability to demonstrate self-control will improve  Medication Management: Evaluate patient's response, side effects, and tolerance of medication regimen.  Therapeutic Interventions: 1 to 1 sessions, Unit Group sessions and Medication administration.  Evaluation of Outcomes:  Progressing   RN Treatment Plan for Primary Diagnosis: Schizoaffective disorder, bipolar type ([place]) Long Term Goal(s): Knowledge of disease and therapeutic regimen to maintain health will improve  Short Term Goals: Ability to remain free from injury will improve, Ability to verbalize frustration and anger appropriately will improve, Ability to demonstrate self-control, Ability to participate in decision making will improve, Ability to verbalize feelings will improve, Ability to disclose and discuss suicidal ideas, Ability to identify and develop effective coping behaviors will improve, and Compliance with prescribed medications will improve  Medication Management: RN will administer medications as ordered by provider, will assess and evaluate patient's response and provide education to patient for prescribed medication. RN will report any adverse and/or side effects to prescribing provider.  Therapeutic Interventions: 1 on 1 counseling sessions, Psychoeducation, Medication administration, Evaluate responses to treatment, Monitor vital signs and CBGs as ordered, Perform/monitor CIWA, COWS, AIMS and Fall Risk screenings as ordered, Perform wound care treatments as ordered.  Evaluation of Outcomes: Progressing   LCSW Treatment Plan for Primary Diagnosis: Schizoaffective disorder, bipolar type ([place]) Long Term Goal(s): Safe transition to appropriate next level of care at discharge, Engage patient in therapeutic group addressing interpersonal concerns.  Short Term Goals: Engage patient in aftercare planning with referrals and resources, Increase social support, Increase ability to appropriately verbalize feelings, Increase emotional regulation, Facilitate acceptance of mental health diagnosis and concerns, Facilitate patient progression through stages of change regarding substance use diagnoses and concerns, Identify triggers associated with mental health/substance abuse issues, and Increase skills for  wellness and recovery  Therapeutic Interventions: Assess for all discharge needs, 1 to 1 time with Social worker, Explore available resources and support systems, Assess for adequacy in community support network, Educate family and significant other(s) on suicide prevention, Complete Psychosocial Assessment, Interpersonal group therapy.  Evaluation of Outcomes: Progressing   Progress in Treatment: Attending groups: Yes. Participating in groups: Yes. Taking medication as prescribed: Yes. Toleration medication: Yes. Family/Significant other contact made: Yes, individual(s) contacted:  [name] ([place]) [phone] Patient understands diagnosis: Yes. Discussing patient identified problems/goals with staff: Yes. Medical problems stabilized or resolved: Yes. Denies suicidal/homicidal ideation: Yes. Issues/concerns per patient self-inventory: Yes. Other:   New problem(s) identified: No, Describe:  None Reported  New Short Term/Long Term Goal(s): Medication Stabilization  Patient Goals:  Community Support  Discharge Plan or Barriers: None Reported  Reason for Continuation of Hospitalization: Anxiety Depression Medical Issues Medication stabilization Suicidal ideation  Estimated Length of Stay: 3-7 Days  Last [place] Suicide Severity Risk Score: [place] Admission (Current) from 01/31/2022 in [place] [facility] ED from 01/30/2022 in [facility] ED from 01/21/2022 in [place] Low Risk High Risk No Risk       Last PHQ 2/9 Scores:    06/27/2021    3:18 AM 05/01/2021    2:16 AM 02/03/2020    2:24 AM  Depression screen PHQ 2/9  Decreased Interest '1 2 3  '$ Down, Depressed, Hopeless '2 3 3  '$ PHQ - 2 Score '3 5 6  '$ Altered sleeping 0 0 3  Tired, decreased energy '1 1 3  '$ Change in appetite 0 1 3  Feeling bad or failure about yourself  '1 3 3  '$ Trouble  concentrating '2 3 3  '$ Moving  slowly or fidgety/restless '1 1 3  '$ Suicidal thoughts '1 3 3  '$ PHQ-9 Score '9 17 27  '$ Difficult doing work/chores Somewhat difficult Extremely dIfficult     medication stabilization, elimination of SI thoughts, development of comprehensive mental wellness plan.    Scribe for Treatment Team: Windle Guard, LCSW 02/02/2022 10:36 AM

## 2022-02-02 NOTE — Progress Notes (Signed)
Adult Psychoeducational Group Note  Date:  02/02/2022 Time:  9:14 PM  Group Topic/Focus:  AA Group  Participation Level:  Active  Participation Quality:  Appropriate  Affect:  Appropriate  Cognitive:  Appropriate  Insight: Appropriate  Engagement in Group:  Engaged  Modes of Intervention:  Discussion, Rapport Building, and Support  Additional Comments:   Pt attended the AA/Wrap Up group.  Wetzel Bjornstad Kahlia Lagunes 02/02/2022, 9:14 PM

## 2022-02-02 NOTE — Progress Notes (Addendum)
Pt rescinded her 72-hours form on 02/02/22 at 1410. Pt reports "I am ok staying here until Thursday now that I'm on my medications".

## 2022-02-03 DIAGNOSIS — F250 Schizoaffective disorder, bipolar type: Secondary | ICD-10-CM | POA: Diagnosis not present

## 2022-02-03 MED ORDER — MELATONIN 1 MG PO TABS
2.0000 mg | ORAL_TABLET | Freq: Every day | ORAL | Status: DC
  Filled 2022-02-03: qty 2

## 2022-02-03 MED ORDER — MELATONIN 3 MG PO TABS
1.5000 mg | ORAL_TABLET | Freq: Every day | ORAL | Status: DC
  Administered 2022-02-03 – 2022-02-05 (×3): 1.5 mg via ORAL
  Filled 2022-02-03: qty 0.5
  Filled 2022-02-03: qty 2
  Filled 2022-02-03 (×3): qty 0.5

## 2022-02-03 MED ORDER — ESCITALOPRAM OXALATE 10 MG PO TABS
10.0000 mg | ORAL_TABLET | Freq: Every day | ORAL | Status: DC
  Administered 2022-02-04 – 2022-02-06 (×3): 10 mg via ORAL
  Filled 2022-02-03: qty 1
  Filled 2022-02-03: qty 3
  Filled 2022-02-03 (×3): qty 1

## 2022-02-03 MED ORDER — CARIPRAZINE HCL 3 MG PO CAPS
3.0000 mg | ORAL_CAPSULE | Freq: Every day | ORAL | Status: DC
  Administered 2022-02-04 – 2022-02-06 (×3): 3 mg via ORAL
  Filled 2022-02-03 (×2): qty 1
  Filled 2022-02-03: qty 3
  Filled 2022-02-03 (×2): qty 1

## 2022-02-03 NOTE — [facility] Group Notes (Signed)
Goals Group 02/03/22   Group Focus: affirmation, clarity of thought, and goals/reality orientation Treatment Modality:  Psychoeducation Interventions utilized were assignment, group exercise, and support Purpose: To be able to understand and verbalize the reason for their admission to the hospital. To understand that the medication helps with their chemical imbalance but they also need to work on their choices in life. To be challenged to develop a list of 30 positives about themselves. Also introduce the concept that "feelings" are not reality.  Participation Level:  did not attend  [name]

## 2022-02-03 NOTE — Progress Notes (Signed)
D. Pt presented with an anxious affect/ mood, fidgety, but cooperative behavior. Pt reported that she was tired today, and would like to reduce her melatonin from 3 mg to 2 mg before bed.  Pt currently denies SI/HI and AVH and agrees to contact staff before acting on any harmful thoughts.  A. Labs and vitals monitored. Pt given and educated on medications. Pt supported emotionally and encouraged to express concerns and ask questions.   R. Pt remains safe with 15 minute checks. Will continue POC.

## 2022-02-03 NOTE — [facility] Group Notes (Signed)
.  Psychoeducational Group Note    Date:  02/03/2022 Time:1300-1400    Purpose of Group: . The group focus' on teaching patients on how to identify their needs and their Life Skills:  A group where two lists are made. What people need and what are things that we do that are unhealthy. The lists are developed by the patients and it is explained that we often do the actions that are not healthy to get our list of needs met.  Goal:: to develop the coping skills needed to get their needs met  Participation Level: did not attend  [name]

## 2022-02-03 NOTE — Progress Notes (Signed)
[place] Group Notes:  (Nursing/MHT/Case Management/Adjunct)  Date:  02/03/2022  Time:  2000  Type of Therapy:   wrap up group  Participation Level:  Active  Participation Quality:  Appropriate, Attentive, Sharing, and Supportive  Affect:  Appropriate  Cognitive:  Lacking  Insight:  Limited  Engagement in Group:  Engaged  Modes of Intervention:  Clarification, Education, and Support  Summary of Progress/Problems: Positive thinking and positive change were discussed.   Shellia Cleverly 02/03/2022, 8:46 PM

## 2022-02-03 NOTE — Progress Notes (Signed)
[facility] MD Progress Note  02/03/2022 2:42 PM [name]  MRN:  [id number]  Reason for admission: 31 year old Caucasian female with extensive hx of mental illness, suicdal ideations & attempts by overdose on her mental health medications. She was recently hospitalized, treated & stabled in this [facility]. She was discharged with an outpatient psychiatric appointments for routine psychiatric care/medication management & Community support team (CST). She is being admitted this time with complain of passive suicidal ideations without any plans or intent.   Daily notes: Patient is seen and examined on [address] and her chart reviewed. The chart findings discussed with the treatment team. Patient reports, "I feel a little better, I think I am over the withdrawal symptoms from my medications."  Patient further added," I would appreciate if you could look at my Vraylar, Lexapro, dosage for increase.  And would like my melatonin dosage to be reduced to 2 mg because it made me too sleepy. I want to feel better. I just want to get back on track with my medicines please".  She endorses SI without any plan.  She denies HI or AVH.  Patient requests was discussed with the attending psychiatrist, he agreed on Lexapro dose increase from 5 mg to 10 mg daily, increasing Vraylar from 1.5 mg to 3 mg daily, and decreasing melatonin from 3 mg to 2 mg p.o. q. nightly, however melatonin 2 mg not available as per pharmacist.  Melatonin was reduced to 1.5 mg.   Will attempt to reach the patient's CST case manager to inquire how often they are meeting with patient regarding her medication regimen and prevention of frequent overdosing. Patient has been visible on the unit in no apparent distress. See treatment plan below. Vital signs reviewed, remain stable except for elevated pulse of 113.  Sent staff to recheck vital signs  Principal Problem: Schizoaffective disorder, bipolar type ([place])  Diagnosis: Principal Problem:    Schizoaffective disorder, bipolar type ([place])  Total Time spent with patient:  35 minutes  Past Psychiatric History: Schizoaffective disorder, bipolar-type.  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 fragile X syndrome    Chronic constipation    Depression    Drug-seeking behavior    Essential tremor    Headache    Overdose of acetaminophen 07/2017   and other meds   Personality disorder ([place])    Schizo-affective psychosis ([place])    Schizoaffective disorder, bipolar type ([place])    Seizures ([place])    Last seizure December 2017   Sleep apnea     Past Surgical History:  Procedure Laterality Date   MOUTH SURGERY  2009 or 2010   Family History:  Family History  Problem Relation Age of Onset   Mental illness Father    Asthma Father    PDD Brother    Seizures Brother    Family Psychiatric  History: See H&P.  Social History:  Social History   Substance and Sexual Activity  Alcohol Use No   Alcohol/week: 1.0 standard drink of alcohol   Types: 1 Standard drinks or equivalent per week   Comment: denies at this time     Social History   Substance and Sexual Activity  Drug Use No   Comment: History of cocaine use at age 51 for 4 months    Social History  Socioeconomic History   Marital status: Widowed    Spouse name: Not on file   Number of children: 0   Years of education: Not on file   Highest education level: Not on file  Occupational History   Occupation: disability  Tobacco Use   Smoking status: Former    Packs/day: 0.00    Types: Cigarettes   Smokeless tobacco: Never   Tobacco comments:    Smoked for 2  years age [age]  Vaping Use   Vaping Use: Never used  Substance and Sexual Activity   Alcohol use: No    Alcohol/week: 1.0 standard drink of alcohol    Types: 1 Standard drinks or equivalent per week    Comment: denies at this time   Drug  use: No    Comment: History of cocaine use at age 71 for 4 months   Sexual activity: Not Currently    Birth control/protection: None  Other Topics Concern   Not on file  Social History Narrative   Marital status: Widowed      Children: daughter      Lives: with boyfriend, in two story home      Employment:  Disability      Tobacco: quit smoking; smoked for two years.      Alcohol ;none      Drugs: none   Has not traveled outside of the country.   Right handed         Social Determinants of Health   Financial Resource Strain: Not on file  Food Insecurity: No Food Insecurity (01/31/2022)   Hunger Vital Sign    Worried About Running Out of Food in the Last Year: Never true    Ran Out of Food in the Last Year: Never true  Recent Concern: Food Insecurity - Food Insecurity Present (12/28/2021)   Hunger Vital Sign    Worried About Charity fundraiser in the Last Year: Often true    Ran Out of Food in the Last Year: Often true  Transportation Needs: Unmet Transportation Needs (01/31/2022)   PRAPARE - Hydrologist (Medical): No    Lack of Transportation (Non-Medical): Yes  Physical Activity: Not on file  Stress: Not on file  Social Connections: Not on file   Additional Social History:   Sleep: Fair  Appetite:  Good  Current Medications: Current Facility-Administered Medications  Medication Dose Route Frequency Provider Last Rate Last Admin   albuterol (VENTOLIN HFA) 108 (90 Base) MCG/ACT inhaler 2 puff  2 puff Inhalation Q6H PRN Rankin, Shuvon B, NP   2 puff at 02/03/22 1025   alum & mag hydroxide-simeth (MAALOX/MYLANTA) 200-200-20 MG/5ML suspension 30 mL  30 mL Oral Q4H PRN Rankin, Shuvon B, NP       benzocaine (ORAJEL) 10 % mucosal gel   Mouth/Throat TID PRN [place] Spar I, NP   1 Application at 63/87/56 1258   cariprazine (VRAYLAR) capsule 1.5 mg  1.5 mg Oral Daily Nwoko, Herbert Pun I, NP   1.5 mg at 02/03/22 0800   escitalopram (LEXAPRO) tablet 5 mg   5 mg Oral Daily [place] Spar I, NP   5 mg at 02/03/22 0801   hydrOXYzine (ATARAX) tablet 25 mg  25 mg Oral TID PRN Rankin, Shuvon B, NP   25 mg at 02/03/22 1023   ibuprofen (ADVIL) tablet 400 mg  400 mg Oral Q6H PRN [place] Spar I, NP   400 mg at 02/03/22 0328   magnesium hydroxide (MILK  OF MAGNESIA) suspension 30 mL  30 mL Oral Daily PRN Rankin, Shuvon B, NP   30 mL at 02/02/22 1316   melatonin tablet 3 mg  3 mg Oral QHS Rankin, Shuvon B, NP   3 mg at 02/02/22 2100   metoprolol succinate (TOPROL-XL) 24 hr tablet 12.5 mg  12.5 mg Oral Daily Rankin, Shuvon B, NP   12.5 mg at 02/03/22 1057   mometasone-formoterol (DULERA) 200-5 MCG/ACT inhaler 2 puff  2 puff Inhalation BID Rankin, Shuvon B, NP   2 puff at 02/03/22 0801   pantoprazole (PROTONIX) EC tablet 20 mg  20 mg Oral Daily Nwoko, Herbert Pun I, NP   20 mg at 02/03/22 0109   prazosin (MINIPRESS) capsule 1 mg  1 mg Oral QHS Nwoko, Agnes I, NP   1 mg at 02/02/22 2059   Lab Results: No results found for this or any previous visit (from the past 48 hour(s)).  Blood Alcohol level:  Lab Results  Component Value Date   ETH <10 10/29/2021   ETH <10 32/35/5732   Metabolic Disorder Labs: Lab Results  Component Value Date   HGBA1C 5.0 10/07/2021   MPG 96.8 10/07/2021   MPG 96.8 09/02/2021   Lab Results  Component Value Date   PROLACTIN 66.2 (H) 03/02/2021   PROLACTIN 66.1 (H) 02/04/2020   Lab Results  Component Value Date   CHOL 175 12/10/2021   TRIG 145 12/10/2021   HDL 44 12/10/2021   CHOLHDL 4.0 12/10/2021   VLDL 29 12/10/2021   LDLCALC 102 (H) 12/10/2021   LDLCALC 74 10/07/2021   Physical Findings: AIMS:  , ,  ,  ,    CIWA:    COWS:     Musculoskeletal: Strength & Muscle Tone: within normal limits Gait & Station: normal Patient leans: N/A  Psychiatric Specialty Exam:  Presentation  General Appearance:  Casual; Fairly Groomed  Eye Contact: Fair  Speech: Pressured; Clear and Coherent  Speech  Volume: Normal  Handedness: Right  Mood and Affect  Mood: Anxious; Depressed  Affect: Congruent; Constricted  Thought Process  Thought Processes: Coherent  Descriptions of Associations:Intact  Orientation:Full (Time, Place and Person)  Thought Content:Logical  History of Schizophrenia/Schizoaffective disorder:Yes  Duration of Psychotic Symptoms:Greater than six months  Hallucinations:Hallucinations: None Description of Auditory Hallucinations: Denies today  Ideas of Reference:None  Suicidal Thoughts:Suicidal Thoughts: No SI Active Intent and/or Plan: -- (Denies) SI Passive Intent and/or Plan: -- (Denies)  Homicidal Thoughts:Homicidal Thoughts: No  Sensorium  Memory: Immediate Fair; Recent Fair; Remote Fair  Judgment: Fair  Insight: Fair  Community education officer  Concentration: Fair  Attention Span: Fair  Recall: [employer] of Knowledge: Fair  Language: Fair  Psychomotor Activity  Psychomotor Activity:Psychomotor Activity: Normal  Assets  Assets: Armed forces logistics/support/administrative officer; Physical Health; Desire for Improvement  Sleep  Sleep:Sleep: Good Number of Hours of Sleep: 6  Physical Exam:  Review of Systems  Constitutional:  Negative for chills and fever.  HENT:  Negative for congestion and sore throat.   Respiratory:  Negative for cough, shortness of breath and wheezing.   Cardiovascular:  Negative for chest pain and palpitations.  Gastrointestinal:  Negative for abdominal pain, constipation, diarrhea, heartburn, nausea and vomiting.  Genitourinary:  Negative for dysuria.  Musculoskeletal:  Negative for joint pain and myalgias.  Skin:  Negative for itching and rash.  Neurological:  Negative for dizziness, tingling, tremors, sensory change, speech change, focal weakness, seizures, loss of consciousness, weakness and headaches.  Endo/Heme/Allergies:  See allergy lists.  Psychiatric/Behavioral:  Negative for hallucinations, memory loss,  substance abuse and suicidal ideas. The patient is nervous/anxious. The patient does not have insomnia.    Blood pressure 117/84, pulse (!) 113, temperature 98.4 F (36.9 C), temperature source Oral, resp. rate 18, height '5\' 4"'$  (1.626 m), weight 90 kg, SpO2 100 %. Body mass index is 34.06 kg/m.  Treatment Plan Summary: Daily contact with patient to assess and evaluate symptoms and progress in treatment and Medication management.   Continue inpatient hospitalization.  Will continue today 02/03/2022 plan as below except where it is noted.   Principle diagnoses. Schizoaffective disorder, bipolar type ([place])  Plan:  -Increase Vraylar 1.5 mg to 3 mg po daily for mood stabilization.  -Increase Lexapro 5 mg to 10 mg po daily for depression. -Continue Vistaril 25 mg po tid prn for anxiety. -Decrease melatonin 3 mg to 1.5 mg po Q hs for insomnia.  -Continue prazosin 1 mg po Q bedtime for PTSD symptoms.  Other medical issues. -Initiated orajel 10% for teeth pain tid prn. -Discontinue ibuprofen 400 mg po Q 6 hrs prn for pain due to propensity for GI bleed with Lexapro. -TOPROL-XL 12.5 mg po daily for HTN.  -Dulera 200-5 MCG/ACT 2 puff bid for SOB. -Protonix 20 mg po Q am for GERD.  Other PRNS -Continue Tylenol 650 mg every 6 hours PRN for mild pain -Continue Maalox 30 ml Q 4 hrs PRN for indigestion -Continue MOM 30 ml po Q 6 hrs for constipation.   Safety and Monitoring: Voluntary admission to inpatient psychiatric unit for safety, stabilization and treatment Daily contact with patient to assess and evaluate symptoms and progress in treatment Patient's case to be discussed in multi-disciplinary team meeting Observation Level : q15 minute checks Vital signs: q12 hours Precautions: Safety  Discharge Planning: Social work and case management to assist with discharge planning and identification of hospital follow-up needs prior to discharge Estimated LOS: 5-7 days Discharge Concerns:  Need to establish a safety plan; Medication compliance and effectiveness Discharge Goals: Return home with outpatient referrals for mental health follow-up including medication management/psychotherapy  Laretta Bolster, FNP, pmhnp, fnp-bc 02/03/2022, 2:42 PMPatient ID: [name], female   DOB: 1990-10-10, 31 y.o.   MRN: [id number]

## 2022-02-03 NOTE — Progress Notes (Signed)
   02/03/22 2254  Psych Admission Type (Psych Patients Only)  Admission Status Voluntary  Psychosocial Assessment  Patient Complaints Anxiety  Eye Contact Fair  Facial Expression Animated  Affect Appropriate to circumstance  Speech Rapid  Interaction Childlike;Assertive  Motor Activity Fidgety  Appearance/Hygiene Disheveled  Behavior Characteristics Appropriate to situation  Mood Anxious  Thought Process  Coherency Concrete thinking  Content Blaming others  Delusions None reported or observed  Perception WDL  Hallucination None reported or observed  Judgment Limited  Confusion None  Danger to Self  Current suicidal ideation? Denies  Description of Suicide Plan verbal  Self-Injurious Behavior No self-injurious ideation or behavior indicators observed or expressed   Agreement Not to Harm Self Yes  Description of Agreement verbal  Danger to Others  Danger to Others None reported or observed  Danger to Others Abnormal  Harmful Behavior to others No threats or harm toward other people  Destructive Behavior No threats or harm toward property

## 2022-02-03 NOTE — Progress Notes (Signed)
Patient signed a 72 hour request for discharge today at 1509.    Patient stated, "I almost have my medications right. All I need is my protonix upped to 40 mg. So I should be ready to leave soon."

## 2022-02-03 NOTE — Group Note (Signed)
[facility] LCSW Group Therapy Note  Date:  02/03/2022   Type of Therapy and Topic:  Group Therapy:  Focus for the New Year  Participation Level:  Active   Description of Group:  The focus of this group was to provide patients with an opportunity to think about and discuss what they can work on this coming year that will result in them being happier and healthier one year from today.  It was reviewed how "new year's resolutions" often fade in importance within a few days, so patients were encouraged to think in a broader, more impactful way about what they wish to focus on to change their lives.  Therapeutic Goals Patients discussed in general the benefit of having goals to work on Patients described their own personal goals/focus for the next year that will enable them to be happier and healthier Patients received encouragement from each other and CSW Patients provided support and ideas to each other  Summary of Patient Progress: During group, patient expressed that her focus for the upcoming year is going to be on her stability.  Additionally, she stated she has to follow through on her goals rather than having another year like 2023.  She was in and out of the group.   Therapeutic Modalities Processing   Selmer Dominion, LCSW

## 2022-02-04 DIAGNOSIS — F250 Schizoaffective disorder, bipolar type: Secondary | ICD-10-CM | POA: Diagnosis not present

## 2022-02-04 MED ORDER — DIPHENHYDRAMINE-ZINC ACETATE 2-0.1 % EX CREA
TOPICAL_CREAM | Freq: Every day | CUTANEOUS | Status: DC | PRN
  Administered 2022-02-04 – 2022-02-05 (×3): 1 via TOPICAL
  Filled 2022-02-04: qty 28

## 2022-02-04 MED ORDER — PANTOPRAZOLE SODIUM 40 MG PO TBEC
40.0000 mg | DELAYED_RELEASE_TABLET | Freq: Every day | ORAL | Status: DC
  Administered 2022-02-05 – 2022-02-06 (×2): 40 mg via ORAL
  Filled 2022-02-04: qty 3
  Filled 2022-02-04 (×3): qty 1

## 2022-02-04 NOTE — Progress Notes (Signed)
[facility] MD Progress Note  02/04/2022 2:46 PM [name]  MRN:  [id number]  Reason for admission: 31 year old Caucasian female with extensive hx of mental illness, suicdal ideations & attempts by overdose on her mental health medications. She was recently hospitalized, treated & stabled in this [facility]. She was discharged with an outpatient psychiatric appointments for routine psychiatric care/medication management & Community support team (CST). She is being admitted this time with complain of passive suicidal ideations without any plans or intent.   Daily notes: On assessment today, patient is seen and examined on [address] and her chart reviewed. Chart findings shared with the treatment team and consults with the attending psychiatrist.  Patient hygiene improved and she is fairly groomed.  Patient reports, "I feel better, my mood has greatly improved."  She continues on Vraylar 3 mg p.o. daily, Lexapro 10 mg p.o. daily, melatonin 1.5 mg p.o. q. nightly, without any GI distress or somatic discomfort.  She denies SI, HI or AVH today.  She does not appear to be responding to internal or external stimuli.  No delusional thinking or paranoia during assessment.  Anyi requests increasing her Protonix from 20 mg to 40 mg due to decreased effectiveness for her GERD, request was honored and discussed with the attending psychiatrist, who also agreed to up Protonix as requested.  Reports good appetite and drinking enough fluid for hydration.  Reports sleeping over 5 hours last night.  Patient is taking all her scheduled medication as ordered, and only requested albuterol inhaler as needed for shortness of breath within the past 24 hours.  Attempted to reach the patient's CST case manager Marylu Lund to inquire how often they are meeting with patient regarding her medication regimen and to prevention of frequent overdosing, was unable to reach however, HIPAA friendly message left on Janet's phone to call this provider  back.  Patient has been visible on the unit and she is in no apparent distress. Vital signs reviewed, remain stable.    Principal Problem: Schizoaffective disorder, bipolar type (HCC)  Diagnosis: Principal Problem:   Schizoaffective disorder, bipolar type (HCC)  Total Time spent with patient:  35 minutes  Past Psychiatric History: Schizoaffective disorder, bipolar-type.  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 fragile X syndrome    Chronic constipation    Depression    Drug-seeking behavior    Essential tremor    Headache    Overdose of acetaminophen 07/2017   and other meds   Personality disorder (HCC)    Schizo-affective psychosis (HCC)    Schizoaffective disorder, bipolar type (HCC)    Seizures (HCC)    Last seizure December 2017   Sleep apnea     Past Surgical History:  Procedure Laterality Date   MOUTH SURGERY  2009 or 2010   Family History:  Family History  Problem Relation Age of Onset   Mental illness Father    Asthma Father    PDD Brother    Seizures Brother    Family Psychiatric  History: See H&P.  Social History:  Social History   Substance and Sexual Activity  Alcohol Use No   Alcohol/week: 1.0 standard drink of alcohol   Types: 1 Standard drinks or equivalent per week   Comment: denies at this time     Social History  Substance and Sexual Activity  Drug Use No   Comment: History of cocaine use at age 81 for 4 months    Social History   Socioeconomic History   Marital status: Widowed    Spouse name: Not on file   Number of children: 0   Years of education: Not on file   Highest education level: Not on file  Occupational History   Occupation: disability  Tobacco Use   Smoking status: Former    Packs/day: 0.00    Types: Cigarettes   Smokeless tobacco: Never   Tobacco comments:    Smoked for 2  years age [age]  Vaping  Use   Vaping Use: Never used  Substance and Sexual Activity   Alcohol use: No    Alcohol/week: 1.0 standard drink of alcohol    Types: 1 Standard drinks or equivalent per week    Comment: denies at this time   Drug use: No    Comment: History of cocaine use at age 56 for 4 months   Sexual activity: Not Currently    Birth control/protection: None  Other Topics Concern   Not on file  Social History Narrative   Marital status: Widowed      Children: daughter      Lives: with boyfriend, in two story home      Employment:  Disability      Tobacco: quit smoking; smoked for two years.      Alcohol ;none      Drugs: none   Has not traveled outside of the country.   Right handed         Social Determinants of Health   Financial Resource Strain: Not on file  Food Insecurity: No Food Insecurity (01/31/2022)   Hunger Vital Sign    Worried About Running Out of Food in the Last Year: Never true    Ran Out of Food in the Last Year: Never true  Recent Concern: Food Insecurity - Food Insecurity Present (12/28/2021)   Hunger Vital Sign    Worried About Programme researcher, broadcasting/film/video in the Last Year: Often true    Ran Out of Food in the Last Year: Often true  Transportation Needs: Unmet Transportation Needs (01/31/2022)   PRAPARE - Administrator, Civil Service (Medical): No    Lack of Transportation (Non-Medical): Yes  Physical Activity: Not on file  Stress: Not on file  Social Connections: Not on file   Additional Social History:   Sleep: Fair  Appetite:  Good  Current Medications: Current Facility-Administered Medications  Medication Dose Route Frequency Provider Last Rate Last Admin   albuterol (VENTOLIN HFA) 108 (90 Base) MCG/ACT inhaler 2 puff  2 puff Inhalation Q6H PRN Rankin, Shuvon B, NP   2 puff at 02/04/22 1123   alum & mag hydroxide-simeth (MAALOX/MYLANTA) 200-200-20 MG/5ML suspension 30 mL  30 mL Oral Q4H PRN Rankin, Shuvon B, NP       benzocaine (ORAJEL) 10 %  mucosal gel   Mouth/Throat TID PRN Armandina Stammer I, NP   1 Application at 02/02/22 1258   cariprazine (VRAYLAR) capsule 3 mg  3 mg Oral Daily Tandrea Kommer C, FNP   3 mg at 02/04/22 0744   escitalopram (LEXAPRO) tablet 10 mg  10 mg Oral Daily Sinclair Alligood C, FNP   10 mg at 02/04/22 0744   hydrOXYzine (ATARAX) tablet 25 mg  25 mg Oral TID PRN Rankin, Shuvon B, NP   25 mg at 02/04/22 0747  magnesium hydroxide (MILK OF MAGNESIA) suspension 30 mL  30 mL Oral Daily PRN Rankin, Shuvon B, NP   30 mL at 02/02/22 1316   melatonin tablet 1.5 mg  1.5 mg Oral QHS Mikyla Schachter C, FNP   1.5 mg at 02/03/22 2103   metoprolol succinate (TOPROL-XL) 24 hr tablet 12.5 mg  12.5 mg Oral Daily Rankin, Shuvon B, NP   12.5 mg at 02/04/22 0744   mometasone-formoterol (DULERA) 200-5 MCG/ACT inhaler 2 puff  2 puff Inhalation BID Rankin, Shuvon B, NP   2 puff at 02/04/22 0745   [START ON 02/05/2022] pantoprazole (PROTONIX) EC tablet 40 mg  40 mg Oral Daily Leeah Politano C, FNP       prazosin (MINIPRESS) capsule 1 mg  1 mg Oral QHS Nwoko, Agnes I, NP   1 mg at 02/03/22 2103   Lab Results: No results found for this or any previous visit (from the past 48 hour(s)).  Blood Alcohol level:  Lab Results  Component Value Date   ETH <10 10/29/2021   ETH <10 10/11/2021   Metabolic Disorder Labs: Lab Results  Component Value Date   HGBA1C 5.0 10/07/2021   MPG 96.8 10/07/2021   MPG 96.8 09/02/2021   Lab Results  Component Value Date   PROLACTIN 66.2 (H) 03/02/2021   PROLACTIN 66.1 (H) 02/04/2020   Lab Results  Component Value Date   CHOL 175 12/10/2021   TRIG 145 12/10/2021   HDL 44 12/10/2021   CHOLHDL 4.0 12/10/2021   VLDL 29 12/10/2021   LDLCALC 102 (H) 12/10/2021   LDLCALC 74 10/07/2021   Physical Findings: AIMS:  , ,  ,  ,    CIWA:    COWS:     Musculoskeletal: Strength & Muscle Tone: within normal limits Gait & Station: normal Patient leans: N/A  Psychiatric Specialty Exam:  Presentation  General  Appearance:  Appropriate for Environment; Casual; Fairly Groomed  Eye Contact: Good  Speech: Clear and Coherent  Speech Volume: Normal  Handedness: Right  Mood and Affect  Mood: Anxious  Affect: Congruent  Thought Process  Thought Processes: Coherent  Descriptions of Associations:Intact  Orientation:Full (Time, Place and Person)  Thought Content:Logical  History of Schizophrenia/Schizoaffective disorder:Yes  Duration of Psychotic Symptoms:Greater than six months  Hallucinations:Hallucinations: None Description of Auditory Hallucinations: Patient denies  Ideas of Reference:None  Suicidal Thoughts:Suicidal Thoughts: No SI Active Intent and/or Plan: -- (Patient denies) SI Passive Intent and/or Plan: -- (Patient denies)  Homicidal Thoughts:Homicidal Thoughts: No  Sensorium  Memory: Immediate Good; Recent Good  Judgment: Fair  Insight: Fair  Art therapist  Concentration: Good  Attention Span: Good  Recall: Fair  Fund of Knowledge: Fair  Language: Good  Psychomotor Activity  Psychomotor Activity:Psychomotor Activity: Normal  Assets  Assets: Communication Skills; Desire for Improvement; Physical Health  Sleep  Sleep:Sleep: Good Number of Hours of Sleep: 5  Physical Exam:  Review of Systems  Constitutional:  Negative for chills and fever.  HENT:  Negative for congestion and sore throat.   Respiratory:  Negative for cough, shortness of breath and wheezing.   Cardiovascular:  Negative for chest pain and palpitations.  Gastrointestinal:  Negative for abdominal pain, constipation, diarrhea, heartburn, nausea and vomiting.  Genitourinary:  Negative for dysuria.  Musculoskeletal:  Negative for joint pain and myalgias.  Skin:  Negative for itching and rash.  Neurological:  Negative for dizziness, tingling, tremors, sensory change, speech change, focal weakness, seizures, loss of consciousness, weakness and headaches.   Endo/Heme/Allergies:  See allergy lists.  Psychiatric/Behavioral:  Negative for hallucinations, memory loss, substance abuse and suicidal ideas. The patient is nervous/anxious and has insomnia.    Blood pressure 114/63, pulse 100, temperature 98.5 F (36.9 C), temperature source Oral, resp. rate 18, height 5\' 4"  (1.626 m), weight 90 kg, SpO2 100 %. Body mass index is 34.06 kg/m.  Treatment Plan Summary: Daily contact with patient to assess and evaluate symptoms and progress in treatment and Medication management.   Continue inpatient hospitalization.  Will continue today 02/04/2022 plan as below except where it is noted.   Principle diagnoses. Schizoaffective disorder, bipolar type (HCC)  Plan:  -Increase Vraylar 1.5 mg to 3 mg po daily for mood stabilization 02/03/2022.  -Increase Lexapro 5 mg to 10 mg po daily for depression 02/03/2022. -Continue Vistaril 25 mg po tid prn for anxiety. -Decrease melatonin 3 mg to 1.5 mg po Q hs for insomnia 02/03/2022.  -Continue prazosin 1 mg po Q bedtime for PTSD symptoms.  Other medical issues. -Initiated orajel 10% for teeth pain tid prn. -Discontinue ibuprofen 400 mg po Q 6 hrs prn for pain due to propensity for GI bleed with Lexapro. -TOPROL-XL 12.5 mg po daily for HTN.  -Dulera 200-5 MCG/ACT 2 puff bid for SOB. -Protonix 40 mg po Q am for GERD 02/04/2022.  Other PRNS -Continue Tylenol 650 mg every 6 hours PRN for mild pain -Continue Maalox 30 ml Q 4 hrs PRN for indigestion -Continue MOM 30 ml po Q 6 hrs for constipation.   Safety and Monitoring: Voluntary admission to inpatient psychiatric unit for safety, stabilization and treatment Daily contact with patient to assess and evaluate symptoms and progress in treatment Patient's case to be discussed in multi-disciplinary team meeting Observation Level : q15 minute checks Vital signs: q12 hours Precautions: Safety  Discharge Planning: Social work and case management to assist  with discharge planning and identification of hospital follow-up needs prior to discharge Estimated LOS: 5-7 days Discharge Concerns: Need to establish a safety plan; Medication compliance and effectiveness Discharge Goals: Return home with outpatient referrals for mental health follow-up including medication management/psychotherapy  Cecilie Lowers, FNP, pmhnp, fnp-bc 02/04/2022, 2:46 PMPatient ID: [name], female   DOB: Apr 18, 1990, 31 y.o.   MRN: [id number] Patient ID: Lorella Reincke, female   DOB: 1990-03-30, 31 y.o.   MRN: [id number]

## 2022-02-04 NOTE — [facility] Group Notes (Signed)
Pt attended wrap up group 

## 2022-02-04 NOTE — Progress Notes (Signed)
D. Pt has been calm and cooperative on the unit- no behavioral issues noted. Pt denied SI/HI and A/VH. Per pt's self inventory, pt rated her depression,hopelessness and anxiety a 0/0/7, respectively. A. Labs and vitals monitored. Pt given and educated on medications. Pt supported emotionally and encouraged to express concerns and ask questions.   R. Pt remains safe with 15 minute checks. Will continue POC.

## 2022-02-04 NOTE — Group Note (Signed)
LCSW Group Therapy Note   02/04/2022 4:40 PM  Type of Therapy and Topic:  Group Therapy:   Introduction to Cognitive-Behavioral Therapy    Participation Level:  Did Not Attend  Description of Group: Participants were asked to identify their overall emotion at the beginning of group.  Patients were asked to identify thoughts they often have while experiencing those emotions, then the opposite -- thoughts that might lead to these emotions.   We also discussed what behaviors might come out of either those thoughts or feelings.  The model of the relationships between feelings, thoughts, and behaviors was explained and diagrammed on the white board.  The remainder of the meeting was spent processing patients' expressing emotions through the CBT lens.  There was an emphasis on the need to check to see if feelings and/or thoughts are based on accurate information.  We also talked about how to appropriately use positive affirmations.  Patients found they could relate to each other's feelings even though their experiences are quite different.  Therapeutic Goals:   Patient will identify their current emotional state Patient will understand the relationship between thoughts, emotions and triggers.  Patient will state at least one positive affirmation they are willing to use at this time   Summary of Patient Progress:  N/A     Therapeutic Modalities: Cognitive Behavioral Therapy Motivational Interviewing

## 2022-02-04 NOTE — B[facility] Group Notes (Signed)
Adult Psychoeducational Group  Date:  02/04/2022 Time:  1100-1200  Group Topic/Focus: Continuation of the group from Saturday. Looking at the lists that were created and talking about what needs to be done with the homework of 30 positives about themselves.                                     Talking about taking their power back and helping themselves to develop a positive self esteem.      Participation Quality:  did not attend Additional Comments:    [name]

## 2022-02-04 NOTE — Progress Notes (Signed)
Patient presented to nurse station with complaints of 'rash'. Fine rash noted bilateral arms, lower legs. Pt reported that she believed the rash was from the detergent, and that she has very sensitive skin. NP made aware. Pt given Vistaril for anxiety, and for itching.

## 2022-02-04 NOTE — [facility] Group Notes (Signed)
Adult Psychoeducational Group Note Date:  02/04/2022 Time:  0900-1000 Group Topic/Focus: PROGRESSIVE RELAXATION. A group where deep breathing is taught and tensing and relaxation muscle groups is used. Imagery is used as well.  Pts are asked to imagine 3 pillars that hold them up when they are not able to hold themselves up and to share that with the group.   Participation Level:  did not attend   : [name]

## 2022-02-05 DIAGNOSIS — F250 Schizoaffective disorder, bipolar type: Secondary | ICD-10-CM | POA: Diagnosis not present

## 2022-02-05 NOTE — Progress Notes (Addendum)
Pt continues to present anxious, fidgety, appearing to be at baseline. Pt reported that she is looking forward to being discharged tomorrow- and that she is planning on getting an uber so she can go straight to [place] for lunch. Pt denies SI/HI and A/VH. She has been visible in the milieu throughout the shift. No behavioral issues noted.     02/05/22 1300  Psych Admission Type (Psych Patients Only)  Admission Status Voluntary  Psychosocial Assessment  Patient Complaints None  Eye Contact Fair  Facial Expression Animated  Affect Appropriate to circumstance  Speech Rapid  Interaction Assertive  Motor Activity Fidgety  Appearance/Hygiene Disheveled  Behavior Characteristics Cooperative;Appropriate to situation  Mood Anxious  Aggressive Behavior  Effect No apparent injury  Thought Process  Coherency Concrete thinking  Content WDL  Delusions None reported or observed  Perception WDL  Hallucination None reported or observed  Judgment Limited  Confusion None  Danger to Self  Current suicidal ideation? Denies  Self-Injurious Behavior No self-injurious ideation or behavior indicators observed or expressed   Agreement Not to Harm Self Yes  Description of Agreement verbal contract for safety  Danger to Others  Danger to Others None reported or observed  Danger to Others Abnormal  Harmful Behavior to others No threats or harm toward other people  Destructive Behavior No threats or harm toward property

## 2022-02-05 NOTE — [facility] Group Notes (Signed)
[place] Group Notes:  (Nursing/MHT/Case Management/Adjunct)  Date:  02/05/2022  Time:  9:39 AM  Type of Therapy:  Group Therapy  Participation Level:  Active  Participation Quality:  Appropriate  Affect:  Appropriate  Cognitive:  Appropriate  Insight:  Appropriate  Engagement in Group:  Engaged  Modes of Intervention:  Socialization  Summary of Progress/Problems:  [name] 02/05/2022, 9:39 AM

## 2022-02-05 NOTE — Plan of Care (Signed)
  Problem: Education: Goal: Emotional status will improve Outcome: Progressing Goal: Mental status will improve Outcome: Progressing Goal: Verbalization of understanding the information provided will improve Outcome: Progressing   Problem: Activity: Goal: Interest or engagement in activities will improve Outcome: Progressing   Problem: Coping: Goal: Ability to verbalize frustrations and anger appropriately will improve Outcome: Progressing Goal: Ability to demonstrate self-control will improve Outcome: Progressing   Problem: Health Behavior/Discharge Planning: Goal: Compliance with treatment plan for underlying cause of condition will improve Outcome: Progressing

## 2022-02-05 NOTE — Progress Notes (Signed)
[facility] MD Progress Note  02/05/2022 3:26 PM [name]  MRN:  [id number]  Reason for admission: 32 year old Caucasian female with extensive hx of mental illness, suicdal ideations & attempts by overdose on her mental health medications. She was recently hospitalized, treated & stabled in this [facility]. She was discharged with an outpatient psychiatric appointments for routine psychiatric care/medication management & Community support team (CST). She is being admitted this time with complain of passive suicidal ideations without any plans or intent.   Daily notes: [name] is seen, chart reviewed. The chart findings discussed with the treatment team. She presents alert, oriented & aware of situation. She is visible on the unit, attending group sessions. She making a much fairer eye contact without much rolling of her eyes. Her affect seem a bit brighter & reactive. She reports, "I was just talking to my cous meaning cousin. We had a good conversation. I'm doing good. I am going home tomorrow. I'm ready to go home too. I'm planning to visit my daughter this weekend. She is with my late husband's mother. She is doing well. I'm working on getting a job after discharge. I'm sleeping fairly & doing well on my medicines. I have no side effects". [name] at this time denies any SIHI, AVH, delusional thoughts or paranoia. She does not appear to be responding to any internal stimuli. There are no changes made on the current plan of care. Will continue as already in progress. Patient is a possible discharge tomorrow".  Principal Problem: Schizoaffective disorder, bipolar type ([place])  Diagnosis: Principal Problem:   Schizoaffective disorder, bipolar type ([place])  Total Time spent with patient:  35 minutes  Past Psychiatric History: Schizoaffective disorder, bipolar-type.  Past Medical History:  Past Medical History:  Diagnosis Date   Acid reflux    Anxiety    Asthma    last attack 03/13/15 or 03/14/15   Autism     Carrier of fragile X syndrome    Chronic constipation    Depression    Drug-seeking behavior    Essential tremor    Headache    Overdose of acetaminophen 07/2017   and other meds   Personality disorder ([place])    Schizo-affective psychosis ([place])    Schizoaffective disorder, bipolar type ([place])    Seizures ([place])    Last seizure December 2017   Sleep apnea     Past Surgical History:  Procedure Laterality Date   MOUTH SURGERY  2009 or 2010   Family History:  Family History  Problem Relation Age of Onset   Mental illness Father    Asthma Father    PDD Brother    Seizures Brother    Family Psychiatric  History: See H&P.  Social History:  Social History   Substance and Sexual Activity  Alcohol Use No   Alcohol/week: 1.0 standard drink of alcohol   Types: 1 Standard drinks or equivalent per week   Comment: denies at this time     Social History   Substance and Sexual Activity  Drug Use No   Comment: History of cocaine use at age 70 for 4 months    Social History   Socioeconomic History   Marital status: Widowed    Spouse name: Not on file   Number of children: 0   Years of education: Not on file   Highest education level: Not on file  Occupational History   Occupation: disability  Tobacco Use   Smoking status: Former    Packs/day:  0.00    Types: Cigarettes   Smokeless tobacco: Never   Tobacco comments:    Smoked for 2  years age [age]  Vaping Use   Vaping Use: Never used  Substance and Sexual Activity   Alcohol use: No    Alcohol/week: 1.0 standard drink of alcohol    Types: 1 Standard drinks or equivalent per week    Comment: denies at this time   Drug use: No    Comment: History of cocaine use at age 30 for 4 months   Sexual activity: Not Currently    Birth control/protection: None  Other Topics Concern   Not on file  Social History Narrative   Marital status: Widowed      Children: daughter      Lives: with boyfriend, in two story home       Employment:  Disability      Tobacco: quit smoking; smoked for two years.      Alcohol ;none      Drugs: none   Has not traveled outside of the country.   Right handed         Social Determinants of Health   Financial Resource Strain: Not on file  Food Insecurity: No Food Insecurity (01/31/2022)   Hunger Vital Sign    Worried About Running Out of Food in the Last Year: Never true    Ran Out of Food in the Last Year: Never true  Recent Concern: [place] Present (12/28/2021)   Hunger Vital Sign    Worried About Charity fundraiser in the Last Year: Often true    Ran Out of Food in the Last Year: Often true  Transportation Needs: Unmet Transportation Needs (01/31/2022)   PRAPARE - Hydrologist (Medical): No    Lack of Transportation (Non-Medical): Yes  Physical Activity: Not on file  Stress: Not on file  Social Connections: Not on file   Additional Social History:   Sleep: Fair  Appetite:  Good  Current Medications: Current Facility-Administered Medications  Medication Dose Route Frequency Provider Last Rate Last Admin   albuterol (VENTOLIN HFA) 108 (90 Base) MCG/ACT inhaler 2 puff  2 puff Inhalation Q6H PRN Rankin, Shuvon B, NP   2 puff at 02/04/22 1123   alum & mag hydroxide-simeth (MAALOX/MYLANTA) 200-200-20 MG/5ML suspension 30 mL  30 mL Oral Q4H PRN Rankin, Shuvon B, NP       benzocaine (ORAJEL) 10 % mucosal gel   Mouth/Throat TID PRN Lindell Spar I, NP   1 Application at 96/29/52 1258   cariprazine (VRAYLAR) capsule 3 mg  3 mg Oral Daily Ntuen, Tina C, FNP   3 mg at 02/05/22 0747   diphenhydrAMINE-zinc acetate (BENADRYL) 2-0.1 % cream   Topical Daily PRN Lindell Spar I, NP   1 Application at 84/13/24 1522   escitalopram (LEXAPRO) tablet 10 mg  10 mg Oral Daily Ntuen, Tina C, FNP   10 mg at 02/05/22 0747   hydrOXYzine (ATARAX) tablet 25 mg  25 mg Oral TID PRN Rankin, Shuvon B, NP   25 mg at 02/05/22 0927   magnesium  hydroxide (MILK OF MAGNESIA) suspension 30 mL  30 mL Oral Daily PRN Rankin, Shuvon B, NP   30 mL at 02/02/22 1316   melatonin tablet 1.5 mg  1.5 mg Oral QHS Ntuen, Tina C, FNP   1.5 mg at 02/04/22 2129   metoprolol succinate (TOPROL-XL) 24 hr tablet 12.5 mg  12.5  mg Oral Daily Rankin, Shuvon B, NP   12.5 mg at 02/05/22 0747   mometasone-formoterol (DULERA) 200-5 MCG/ACT inhaler 2 puff  2 puff Inhalation BID Rankin, Shuvon B, NP   2 puff at 02/05/22 0747   pantoprazole (PROTONIX) EC tablet 40 mg  40 mg Oral Daily Ntuen, Tina C, FNP   40 mg at 02/05/22 0553   prazosin (MINIPRESS) capsule 1 mg  1 mg Oral QHS Toye Rouillard I, NP   1 mg at 02/04/22 2130   Lab Results: No results found for this or any previous visit (from the past 48 hour(s)).  Blood Alcohol level:  Lab Results  Component Value Date   ETH <10 10/29/2021   ETH <10 15/06/6977   Metabolic Disorder Labs: Lab Results  Component Value Date   HGBA1C 5.0 10/07/2021   MPG 96.8 10/07/2021   MPG 96.8 09/02/2021   Lab Results  Component Value Date   PROLACTIN 66.2 (H) 03/02/2021   PROLACTIN 66.1 (H) 02/04/2020   Lab Results  Component Value Date   CHOL 175 12/10/2021   TRIG 145 12/10/2021   HDL 44 12/10/2021   CHOLHDL 4.0 12/10/2021   VLDL 29 12/10/2021   LDLCALC 102 (H) 12/10/2021   LDLCALC 74 10/07/2021   Physical Findings: AIMS:  , ,  ,  ,    CIWA:    COWS:     Musculoskeletal: Strength & Muscle Tone: within normal limits Gait & Station: normal Patient leans: N/A  Psychiatric Specialty Exam:  Presentation  General Appearance:  Appropriate for Environment; Casual; Fairly Groomed  Eye Contact: Good  Speech: Clear and Coherent  Speech Volume: Normal  Handedness: Right  Mood and Affect  Mood: Anxious  Affect: Congruent  Thought Process  Thought Processes: Coherent  Descriptions of Associations:Intact  Orientation:Full (Time, Place and Person)  Thought Content:Logical  History of  Schizophrenia/Schizoaffective disorder:Yes  Duration of Psychotic Symptoms:Greater than six months  Hallucinations:Hallucinations: None Description of Auditory Hallucinations: Patient denies  Ideas of Reference:None  Suicidal Thoughts:Suicidal Thoughts: No SI Active Intent and/or Plan: -- (Patient denies) SI Passive Intent and/or Plan: -- (Patient denies)  Homicidal Thoughts:Homicidal Thoughts: No  Sensorium  Memory: Immediate Good; Recent Good  Judgment: Fair  Insight: Fair  Community education officer  Concentration: Good  Attention Span: Good  Recall: [place] of Knowledge: Fair  Language: Good  Psychomotor Activity  Psychomotor Activity:Psychomotor Activity: Normal  Assets  Assets: Communication Skills; Desire for Improvement; Physical Health  Sleep  Sleep:Sleep: Good Number of Hours of Sleep: 5  Physical Exam:  Review of Systems  Constitutional:  Negative for chills and fever.  HENT:  Negative for congestion and sore throat.   Respiratory:  Negative for cough, shortness of breath and wheezing.   Cardiovascular:  Negative for chest pain and palpitations.  Gastrointestinal:  Negative for abdominal pain, constipation, diarrhea, heartburn, nausea and vomiting.  Genitourinary:  Negative for dysuria.  Musculoskeletal:  Negative for joint pain and myalgias.  Skin:  Negative for itching and rash.  Neurological:  Negative for dizziness, tingling, tremors, sensory change, speech change, focal weakness, seizures, loss of consciousness, weakness and headaches.  Endo/Heme/Allergies:        See allergy lists.  Psychiatric/Behavioral:  Negative for hallucinations, memory loss, substance abuse and suicidal ideas. The patient is nervous/anxious ("improving"). The patient does not have insomnia.    Blood pressure 117/74, pulse (!) 102, temperature 98.2 F (36.8 C), temperature source Oral, resp. rate 18, height '5\' 4"'$  (1.626 m), weight 90  kg, SpO2 99 %. Body mass  index is 34.06 kg/m.  Treatment Plan Summary: Daily contact with patient to assess and evaluate symptoms and progress in treatment and Medication management.   Continue inpatient hospitalization.  Will continue today 02/05/2022 plan as below except where it is noted.   Principle diagnoses. Schizoaffective disorder, bipolar type ([place])  Plan:  -Continue Vraylar 3 mg po daily for mood stabilization 02/03/2022.  -Continue Lexapro 10 mg po daily for depression 02/03/2022. -Continue Vistaril 25 mg po tid prn for anxiety. -Continue Melatonin 1.5 mg po Q hs for insomnia 02/03/2022.  -Continue prazosin 1 mg po Q bedtime for PTSD symptoms.  Other medical issues. -Continue orajel 10% for teeth pain tid prn. -Discontinue ibuprofen 400 mg po Q 6 hrs prn for pain. -Continue TOPROL-XL 12.5 mg po daily for HTN.  -Continue Dulera 200-5 MCG/ACT 2 puff bid for SOB. -Continue Protonix 40 mg po Q am for GERD 02/04/2022.  Other PRNS -Continue Tylenol 650 mg every 6 hours PRN for mild pain -Continue Maalox 30 ml Q 4 hrs PRN for indigestion -Continue MOM 30 ml po Q 6 hrs for constipation.   Safety and Monitoring: Voluntary admission to inpatient psychiatric unit for safety, stabilization and treatment Daily contact with patient to assess and evaluate symptoms and progress in treatment Patient's case to be discussed in multi-disciplinary team meeting Observation Level : q15 minute checks Vital signs: q12 hours Precautions: Safety  Discharge Planning: Social work and case management to assist with discharge planning and identification of hospital follow-up needs prior to discharge Estimated LOS: 5-7 days Discharge Concerns: Need to establish a safety plan; Medication compliance and effectiveness Discharge Goals: Return home with outpatient referrals for mental health follow-up including medication management/psychotherapy  Lindell Spar, NP, pmhnp, fnp-bc 02/05/2022, 3:26 PMPatient ID: [name], female   DOB: May 01, 1990, 32 y.o.   MRN: [id number] Patient ID: Undrea Archbold, female   DOB: 1990-06-21, 32 y.o.   MRN: [id number] Patient ID: Dezeray Puccio, female   DOB: [birth year], 32 y.o.   MRN: [id number]

## 2022-02-05 NOTE — Progress Notes (Signed)
Pt said that she feels prepared for discharge tomorrow. Some of the coping skills she has been using are coloring, taking warm showers, and a diamond craft that she said is like "cross-stitching." She does report increased appetite prior to admission, but she said that her appetite has decreased. Pt said that today she ate about 50% of her meals. Pt has been calm and cooperative on the unit. She said that she will be staying home alone upon discharge. She did not attend wrap-up group earlier tonight. Pt denies SI/HI and AVH. Active listening, reassurance, and support provided. Q 15 min safety checks continue. Pt's safety has been maintained.   02/05/22 2100  Psych Admission Type (Psych Patients Only)  Admission Status Voluntary  Psychosocial Assessment  Patient Complaints Appetite decrease  Eye Contact Fair  Facial Expression Animated;Anxious  Affect Appropriate to circumstance  Speech Rapid;Logical/coherent  Interaction Assertive  Motor Activity Fidgety  Appearance/Hygiene Disheveled;Improved  Behavior Characteristics Cooperative;Appropriate to situation;Anxious;Fidgety  Mood Anxious;Pleasant  Thought Process  Coherency Concrete thinking  Content WDL  Delusions None reported or observed  Perception WDL  Hallucination None reported or observed  Judgment Limited  Confusion None  Danger to Self  Current suicidal ideation? Denies  Self-Injurious Behavior No self-injurious ideation or behavior indicators observed or expressed   Agreement Not to Harm Self Yes  Description of Agreement verbally contracts for safety  Danger to Others  Danger to Others None reported or observed  Danger to Others Abnormal  Harmful Behavior to others No threats or harm toward other people  Destructive Behavior No threats or harm toward property

## 2022-02-05 NOTE — BHH Group Notes (Signed)
Pt did not attend AA group

## 2022-02-05 NOTE — Progress Notes (Signed)
    02/04/22 2130  Psych Admission Type (Psych Patients Only)  Admission Status Voluntary  Psychosocial Assessment  Patient Complaints None  Eye Contact Fair  Facial Expression Animated  Affect Appropriate to circumstance  Speech Rapid  Interaction Assertive  Motor Activity Fidgety  Appearance/Hygiene Disheveled  Behavior Characteristics Cooperative;Appropriate to situation  Mood Anxious  Thought Process  Coherency Concrete thinking  Content WDL  Delusions None reported or observed  Perception WDL  Hallucination None reported or observed  Judgment Limited  Confusion None  Danger to Self  Current suicidal ideation? Denies  Self-Injurious Behavior No self-injurious ideation or behavior indicators observed or expressed   Agreement Not to Harm Self Yes  Description of Agreement verbal  Danger to Others  Danger to Others None reported or observed  Danger to Others Abnormal  Harmful Behavior to others No threats or harm toward other people  Destructive Behavior No threats or harm toward property

## 2022-02-06 DIAGNOSIS — F250 Schizoaffective disorder, bipolar type: Secondary | ICD-10-CM

## 2022-02-06 MED ORDER — ESCITALOPRAM OXALATE 10 MG PO TABS: 10.0000 mg | ORAL_TABLET | Freq: Every day | ORAL | 0 refills | Status: DC

## 2022-02-06 MED ORDER — MELATONIN 3 MG PO TABS: 1.5000 mg | ORAL_TABLET | Freq: Every day | ORAL | 0 refills | Status: AC

## 2022-02-06 MED ORDER — PRAZOSIN HCL 1 MG PO CAPS: 1.0000 mg | ORAL_CAPSULE | Freq: Every day | ORAL | 0 refills | Status: DC

## 2022-02-06 MED ORDER — CARIPRAZINE HCL 3 MG PO CAPS: 3.0000 mg | ORAL_CAPSULE | Freq: Every day | ORAL | 0 refills | Status: AC

## 2022-02-06 MED ORDER — PANTOPRAZOLE SODIUM 40 MG PO TBEC: 40.0000 mg | DELAYED_RELEASE_TABLET | Freq: Every day | ORAL | 0 refills | Status: DC

## 2022-02-06 MED ORDER — HYDROXYZINE HCL 25 MG PO TABS: 25.0000 mg | ORAL_TABLET | Freq: Every day | ORAL | 0 refills | Status: AC | PRN

## 2022-02-06 MED ORDER — METOPROLOL SUCCINATE ER 25 MG PO TB24: 12.5000 mg | ORAL_TABLET | Freq: Every day | ORAL | 0 refills | Status: DC

## 2022-02-06 NOTE — Progress Notes (Signed)
Patient verbalizes readiness for discharge. All patient belongings returned to patient. Discharge instructions read and discussed with patient (appointments, medications, resources). Paper prescriptions and three days of medication samples given to patient. Patient expressed gratitude for care provided. Patient discharged to lobby at 1120.

## 2022-02-06 NOTE — Progress Notes (Signed)
  [facility] Adult Case Management Discharge Plan :  Will you be returning to the same living situation after discharge:  Yes,  Patient will be returning back to her home At discharge, do you have transportation home?: Yes,  Patient states that she will be calling her own uber once she is DC to the lobby Do you have the ability to pay for your medications: Yes,  Patient has [employer]   Release of information consent forms completed and in the chart;  Patient's signature needed at discharge.  Patient to Follow up at:  Follow-up Information     [facility]. Call.   Why: Please call your CST Team and inform them of your discharge from the  hospital. You can also reach Butch Penny, therapist, at [phone] or Jeanie at [phone]. Contact information: [place] [address] [place], [place] on 02/12/2022.   Why: You have an appointment for medication management services on 02/12/22 at 10:45 am. Contact information: [place] [address] [phone]                 Next level of care provider has access to [place] and Suicide Prevention discussed: No. CST did not return CSW 2 attempt calls. [name] (CST) [phone] ( 1x attempt left vm, 12/29 @ 2:03 PM; 2nd attempt 02/06/22 @ 9:34 )     Has patient been referred to the Quitline?: N/A patient is not a smoker  Patient has been referred for addiction treatment: N/A  Sherre Lain, LCSWA 02/06/2022, 9:43 AM

## 2022-02-06 NOTE — [facility] Suicide Risk Assessment (Signed)
[place] INPATIENT:  Family/Significant Other Suicide Prevention Education  Suicide Prevention Education:  Contact Attempts: [name] ([place]) [phone], (name of family member/significant other) has been identified by the patient as the family member/significant other with whom the patient will be residing, and identified as the person(s) who will aid the patient in the event of a mental health crisis.  With written consent from the patient, two attempts were made to provide suicide prevention education, prior to and/or following the patient's discharge.  We were unsuccessful in providing suicide prevention education.  A suicide education pamphlet was given to the patient to share with family/significant other.  Date and time of first attempt:02/02/22 / 2:03 PM Date and time of second attempt:02/06/22 / 9:36 AM  [name] 02/06/2022, 9:36 AM

## 2022-02-06 NOTE — Discharge Summary (Signed)
Physician Discharge Summary Note  Patient:  [name] is an 32 y.o., female MRN:  [id number] DOB:  [birth year] Patient phone:  [phone] (home)  Patient address:   [place] [address],  Total Time spent with patient: 20 minutes  Date of Admission:  01/31/2022 Date of Discharge: 02-06-2022  Reason for Admission:    32 year old Caucasian female with extensive hx of mental illness, suicdal ideations & attempts by overdose on her mental health medications. She was recently hospitalized, treated & stabled in this [facility]. She was discharged with an outpatient psychiatric appointments for routine psychiatric care/medication management & Community support team (CST). She is being admitted this time with complain of passive suicidal ideations without any plans or intent.    Principal Problem: Schizoaffective disorder, bipolar type [place] [facility]) Discharge Diagnoses: Principal Problem:   Schizoaffective disorder, bipolar type ([place])   Past Psychiatric History:   Previous Psych Diagnoses: Schizoaffective disorder bipolar type, Anxiety, Autism spectrum disorder, PTSD, Borderline personality disorder  Prior inpatient treatment: Multiple, over 20 inpatient admissions over the course of her lifetime, 6 inpatient admissions in the past 6 months Outpatient treatment: has ACT team  Prior rehab hx: Denies History of suicide: multiple attempts, most recently a prescription overdose 2 weeks ago History of homicide: Denies Psychiatric medication history: Previously trialed Tegretol, Haldol, Clozaril, Depakote, Invega, and Lamictal  Psychiatric medication compliance history: Poor   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 fragile X syndrome    Chronic constipation    Depression    Drug-seeking behavior    Essential tremor    Headache    Overdose of  acetaminophen 07/2017   and other meds   Personality disorder ([place])    Schizo-affective psychosis ([place])    Schizoaffective disorder, bipolar type ([place])    Seizures ([place])    Last seizure December 2017   Sleep apnea     Past Surgical History:  Procedure Laterality Date   MOUTH SURGERY  2009 or 2010   Family History:  Family History  Problem Relation Age of Onset   Mental illness Father    Asthma Father    PDD Brother    Seizures Brother    Family Psychiatric  History:  Medical: Mom-skin cancer, maternal grandpa-type 2 diabetes and heart disease, maternal grandma-thyroid and heart problems, dad-COPD, brother-asthma Psych: Dad-depression, brother, bipolar 1 disorder Psych Rx: Unsure SA/HA: 2 paternal great uncles completed suicide as well as ex-husband Substance use family hx:  paternal uncle-alcohol use disorder   Social History:  Social History   Substance and Sexual Activity  Alcohol Use No   Alcohol/week: 1.0 standard drink of alcohol   Types: 1 Standard drinks or equivalent per week   Comment: denies at this time     Social History   Substance and Sexual Activity  Drug Use No   Comment: History of cocaine use at age 46 for 4 months    Social History   Socioeconomic History   Marital status: Widowed    Spouse name: Not on file   Number of children: 0   Years of education: Not on file   Highest education level: Not on file  Occupational History   Occupation: disability  Tobacco Use   Smoking status: Former    Packs/day: 0.00  Types: Cigarettes   Smokeless tobacco: Never   Tobacco comments:    Smoked for 2  years age [age]  Vaping Use   Vaping Use: Never used  Substance and Sexual Activity   Alcohol use: No    Alcohol/week: 1.0 standard drink of alcohol    Types: 1 Standard drinks or equivalent per week    Comment: denies at this time   Drug use: No    Comment: History of cocaine use at age 10 for 4 months   Sexual activity: Not Currently     Birth control/protection: None  Other Topics Concern   Not on file  Social History Narrative   Marital status: Widowed      Children: daughter      Lives: with boyfriend, in two story home      Employment:  Disability      Tobacco: quit smoking; smoked for two years.      Alcohol ;none      Drugs: none   Has not traveled outside of the country.   Right handed         Social Determinants of Health   Financial Resource Strain: Not on file  Food Insecurity: No Food Insecurity (01/31/2022)   Hunger Vital Sign    Worried About Running Out of Food in the Last Year: Never true    Ran Out of Food in the Last Year: Never true  Recent Concern: Food Insecurity - Food Insecurity Present (12/28/2021)   Hunger Vital Sign    Worried About Charity fundraiser in the Last Year: Often true    Ran Out of Food in the Last Year: Often true  Transportation Needs: Unmet Transportation Needs (01/31/2022)   PRAPARE - Hydrologist (Medical): No    Lack of Transportation (Non-Medical): Yes  Physical Activity: Not on file  Stress: Not on file  Social Connections: Not on file    Hospital Course:     During the patient's hospitalization, patient had extensive initial psychiatric evaluation, and follow-up psychiatric evaluations every day.   Psychiatric diagnoses provided upon initial assessment:    Schizoaffective disorder, bipolar type ([place])   Borderline personality disorder ([place])   PTSD (post-traumatic stress disorder)   Patient's psychiatric medications were adjusted on admission:  -Initiated Vraylar 1.5 mg po daily for mood stabilization.  -Initiated Lexapro 5 mg po daily for depression. -Continue Vistaril 25 mg po tid prn for anxiety. -Continue Melatonin 3 mg po Q hs for insomnia.  -Initiated Prazosin 1 mg po Q bedtime for PTSD symptoms.   During the hospitalization, other adjustments were made to the patient's psychiatric medication regimen:  -Vraylar  increased to 3 mg once daily -lexapro increased to 10 mg once daily  -protonix started   Patient's care was discussed during the interdisciplinary team meeting every day during the hospitalization.   The patient denied having side effects to prescribed psychiatric medication.   Gradually, patient started adjusting to milieu. The patient was evaluated each day by a clinical provider to ascertain response to treatment. Improvement was noted by the patient's report of decreasing symptoms, improved sleep and appetite, affect, medication tolerance, behavior, and participation in unit programming.  Patient was asked each day to complete a self inventory noting mood, mental status, pain, new symptoms, anxiety and concerns.     Symptoms were reported as significantly decreased or resolved completely by discharge.    On day of discharge, the patient reports that their mood is  stable. The patient denied having suicidal thoughts for more than 48 hours prior to discharge.  Patient denies having homicidal thoughts.  Patient denies having auditory hallucinations.  Patient denies any visual hallucinations or other symptoms of psychosis. The patient was motivated to continue taking medication with a goal of continued improvement in mental health.    The patient reports their target psychiatric symptoms of depression, mood lability, and suicidal thoughts, all responded well to the psychiatric medications, and the patient reports overall benefit other psychiatric hospitalization. Supportive psychotherapy was provided to the patient. The patient also participated in regular group therapy while hospitalized. Coping skills, problem solving as well as relaxation therapies were also part of the unit programming.   Labs were reviewed with the patient, and abnormal results were discussed with the patient.   The patient is able to verbalize their individual safety plan to this provider.   # It is recommended to the  patient to continue psychiatric medications as prescribed, after discharge from the hospital.     # It is recommended to the patient to follow up with your outpatient psychiatric provider and PCP.   # It was discussed with the patient, the impact of alcohol, drugs, tobacco have been there overall psychiatric and medical wellbeing, and total abstinence from substance use was recommended the patient.ed.   # Prescriptions provided or sent directly to preferred pharmacy at discharge. Patient agreeable to plan. Given opportunity to ask questions. Appears to feel comfortable with discharge.    # In the event of worsening symptoms, the patient is instructed to call the crisis hotline, 911 and or go to the nearest ED for appropriate evaluation and treatment of symptoms. To follow-up with primary care provider for other medical issues, concerns and or health care needs   # Patient was discharged to home with CST team, with a plan to follow up as noted below.     Physical Findings: AIMS:  , ,  ,  ,    CIWA:    COWS:     Aims score zero on my exam. No eps on my exam.   Musculoskeletal: Strength & Muscle Tone: within normal limits Gait & Station: normal Patient leans: N/A   Psychiatric Specialty Exam:  Presentation  General Appearance:  Casual  Eye Contact: Good  Speech: Clear and Coherent; Normal Rate  Speech Volume: Normal  Handedness: Right   Mood and Affect  Mood: Euthymic; Anxious  Affect: Congruent   Thought Process  Thought Processes: Linear  Descriptions of Associations:Intact  Orientation:Full (Time, Place and Person)  Thought Content:Logical  History of Schizophrenia/Schizoaffective disorder:Yes  Duration of Psychotic Symptoms:Greater than six months  Hallucinations:Hallucinations: None  Ideas of Reference:None  Suicidal Thoughts:Suicidal Thoughts: No  Homicidal Thoughts:Homicidal Thoughts: No   Sensorium  Memory: Immediate Good; Recent  Good; Remote Good  Judgment: Fair  Insight: Fair   Materials engineer: Fair  Attention Span: Fair  Recall: [place] of Knowledge: Fair  Language: Good   Psychomotor Activity  Psychomotor Activity: Psychomotor Activity: Normal   Assets  Assets: Communication Skills; Desire for Improvement; Physical Health   Sleep  Sleep: Sleep: Fair    Physical Exam: Physical Exam Vitals reviewed.  Constitutional:      General: She is not in acute distress.    Appearance: She is normal weight. She is not toxic-appearing.  Neurological:     Mental Status: She is alert.     Motor: No weakness.     Gait: Gait  normal.  Psychiatric:        Mood and Affect: Mood normal.        Thought Content: Thought content normal.    Review of Systems  Constitutional:  Negative for chills and fever.  Cardiovascular:  Negative for chest pain and palpitations.  Neurological:  Negative for dizziness, tingling, tremors and headaches.  Psychiatric/Behavioral:  Negative for depression, hallucinations, memory loss, substance abuse and suicidal ideas. The patient is not nervous/anxious and does not have insomnia.   All other systems reviewed and are negative.  Blood pressure 127/71, pulse (!) 101, temperature 97.7 F (36.5 C), temperature source Oral, resp. rate 18, height '5\' 4"'$  (1.626 m), weight 90 kg, SpO2 99 %. Body mass index is 34.06 kg/m.   Social History   Tobacco Use  Smoking Status Former   Packs/day: 0.00   Types: Cigarettes  Smokeless Tobacco Never  Tobacco Comments   Smoked for 2  years age [age]   Tobacco Cessation:  N/A, patient does not currently use tobacco products   Blood Alcohol level:  Lab Results  Component Value Date   ETH <10 10/29/2021   ETH <10 49/70/2637    Metabolic Disorder Labs:  Lab Results  Component Value Date   HGBA1C 5.0 10/07/2021   MPG 96.8 10/07/2021   MPG 96.8 09/02/2021   Lab Results  Component Value Date    PROLACTIN 66.2 (H) 03/02/2021   PROLACTIN 66.1 (H) 02/04/2020   Lab Results  Component Value Date   CHOL 175 12/10/2021   TRIG 145 12/10/2021   HDL 44 12/10/2021   CHOLHDL 4.0 12/10/2021   VLDL 29 12/10/2021   LDLCALC 102 (H) 12/10/2021   LDLCALC 74 10/07/2021    See Psychiatric Specialty Exam and Suicide Risk Assessment completed by Attending Physician prior to discharge.  Discharge destination:  Home  Is patient on multiple antipsychotic therapies at discharge:  No   Has Patient had three or more failed trials of antipsychotic monotherapy by history:  No  Recommended Plan for Multiple Antipsychotic Therapies: NA  Discharge Instructions     Diet - low sodium heart healthy   Complete by: As directed    Increase activity slowly   Complete by: As directed       Allergies as of 02/06/2022       Reactions   Bee Venom Anaphylaxis   Coconut Flavor Anaphylaxis, Rash   Fish Allergy Anaphylaxis   Geodon [ziprasidone Hcl] Other (See Comments)   Pt states that this medication causes paralysis of the mouth.     Haloperidol And Related Other (See Comments)   Pt states that this medication causes paralysis of the mouth, jaw locks up   Lithobid [lithium] Other (See Comments)   Seizure-like activity   Roxicodone [oxycodone] Other (See Comments)   Hallucinations    Seroquel [quetiapine] Other (See Comments)   Severe drowsiness Pt currently taking 12.5 mg BID, she is ok with this dose   Shellfish Allergy Anaphylaxis   Phenergan [promethazine Hcl] Other (See Comments)   Chest pain    Prilosec [omeprazole] Nausea And Vomiting   Pt can take protonix with no problems   Sulfa Antibiotics Other (See Comments)   Chest pain    Tegretol [carbamazepine] Nausea And Vomiting   Prozac [fluoxetine] Other (See Comments)   Increased Depression Suicidal thoughts   Tape Other (See Comments)   Skin tears, can only tolerate paper tape.   Tylenol [acetaminophen] Other (See Comments)   Unknown  reaction Pt states  she can not take        Medication List     STOP taking these medications    ondansetron 4 MG disintegrating tablet Commonly known as: ZOFRAN-ODT       TAKE these medications      Indication  albuterol 108 (90 Base) MCG/ACT inhaler Commonly known as: VENTOLIN HFA Inhale 2 puffs into the lungs every 6 (six) hours as needed for wheezing or shortness of breath.  Indication: Asthma   budesonide-formoterol 160-4.5 MCG/ACT inhaler Commonly known as: SYMBICORT Inhale 2 puffs into the lungs 2 (two) times daily.  Indication: Asthma   cariprazine 3 MG capsule Commonly known as: VRAYLAR Take 1 capsule (3 mg total) by mouth daily for 14 days. Start taking on: February 07, 2022  Indication: Manic Phase of Manic-Depression   escitalopram 10 MG tablet Commonly known as: LEXAPRO Take 1 tablet (10 mg total) by mouth daily for 14 days. Start taking on: February 07, 2022  Indication: Major Depressive Disorder   hydrOXYzine 25 MG tablet Commonly known as: ATARAX Take 1 tablet (25 mg total) by mouth daily as needed for up to 14 days for anxiety.  Indication: Feeling Anxious   melatonin 3 MG Tabs tablet Take 0.5 tablets (1.5 mg total) by mouth at bedtime for 14 days.  Indication: Trouble Sleeping   metoprolol succinate 25 MG 24 hr tablet Commonly known as: TOPROL-XL Take 0.5 tablets (12.5 mg total) by mouth daily for 14 days.  Indication: High Blood Pressure Disorder   pantoprazole 40 MG tablet Commonly known as: PROTONIX Take 1 tablet (40 mg total) by mouth daily for 14 days. Start taking on: February 07, 2022  Indication: Gastroesophageal Reflux Disease   prazosin 1 MG capsule Commonly known as: MINIPRESS Take 1 capsule (1 mg total) by mouth at bedtime for 14 days.  Indication: Frightening Dreams        Follow-up Information     [facility] Call.   Why: Please call your CST Team and inform them of your discharge from the  hospital. You can also reach  Butch Penny, therapist, at [phone] or Jeanie at [phone]. Contact information: [place] [address] [place], [place] on 02/12/2022.   Why: You have an appointment for medication management services on 02/12/22 at 10:45 am. Contact information: [place] [address] [phone]                 Follow-up recommendations:    Activity: as tolerated   Diet: heart healthy   Other: -Follow-up with your outpatient psychiatric provider -instructions on appointment date, time, and address (location) are provided to you in discharge paperwork.   Pt given 3 days samples of meds at dc. Then printed 14 days of meds to be dispensed in 7 day intervals, for cst team to get rx filled.   -Take your psychiatric medications as prescribed at discharge - instructions are provided to you in the discharge paperwork   -Follow-up with outpatient primary care doctor and other specialists -for management of preventative medicine and chronic medical disease   -Recommend abstinence from alcohol, tobacco, and other illicit drug use at discharge.    -If your psychiatric symptoms recur, worsen, or if you have side effects to your psychiatric medications, call your outpatient psychiatric provider, 911, [phone] or go to the nearest emergency department.   -If suicidal thoughts recur, call your outpatient psychiatric provider, 911, [phone] or go  to the nearest emergency department.    Signed: Christoper Allegra, MD 02/06/2022, 9:53 AM     Total Time Spent in Direct Patient Care:  I personally spent 35 minutes on the unit in direct patient care. The direct patient care time included face-to-face time with the patient, reviewing the patient's chart, communicating with other professionals, and coordinating care. Greater than 50% of this time was spent in counseling or coordinating care with the patient regarding goals of  hospitalization, psycho-education, and discharge planning needs.   Janine Limbo, MD Psychiatrist

## 2022-02-06 NOTE — BHH Suicide Risk Assessment (Signed)
[facility] Discharge Suicide Risk Assessment   Principal Problem: Schizoaffective disorder, bipolar type [facility]) Discharge Diagnoses: Principal Problem:   Schizoaffective disorder, bipolar type ([place])   Total Time spent with patient: 61 minutes   32 year old Caucasian female with extensive hx of mental illness, suicdal ideations & attempts by overdose on her mental health medications. She was recently hospitalized, treated & stabled in this [facility]. She was discharged with an outpatient psychiatric appointments for routine psychiatric care/medication management & Community support team (CST). She is being admitted this time with complain of passive suicidal ideations without any plans or intent.   HOSPITAL COURSE:  During the patient's hospitalization, patient had extensive initial psychiatric evaluation, and follow-up psychiatric evaluations every day.  Psychiatric diagnoses provided upon initial assessment:    Schizoaffective disorder, bipolar type ([place])   Borderline personality disorder ([place])   PTSD (post-traumatic stress disorder)  Patient's psychiatric medications were adjusted on admission:  -Initiated Vraylar 1.5 mg po daily for mood stabilization.  -Initiated Lexapro 5 mg po daily for depression. -Continue Vistaril 25 mg po tid prn for anxiety. -Continue Melatonin 3 mg po Q hs for insomnia.  -Initiated Prazosin 1 mg po Q bedtime for PTSD symptoms.  During the hospitalization, other adjustments were made to the patient's psychiatric medication regimen:  -Vraylar increased to 3 mg once daily -lexapro increased to 10 mg once daily  -protonix started  Patient's care was discussed during the interdisciplinary team meeting every day during the hospitalization.  The patient denied having side effects to prescribed psychiatric medication.  Gradually, patient started adjusting to milieu. The patient was evaluated each day by a clinical provider to ascertain response to treatment. Improvement  was noted by the patient's report of decreasing symptoms, improved sleep and appetite, affect, medication tolerance, behavior, and participation in unit programming.  Patient was asked each day to complete a self inventory noting mood, mental status, pain, new symptoms, anxiety and concerns.    Symptoms were reported as significantly decreased or resolved completely by discharge.   On day of discharge, the patient reports that their mood is stable. The patient denied having suicidal thoughts for more than 48 hours prior to discharge.  Patient denies having homicidal thoughts.  Patient denies having auditory hallucinations.  Patient denies any visual hallucinations or other symptoms of psychosis. The patient was motivated to continue taking medication with a goal of continued improvement in mental health.   The patient reports their target psychiatric symptoms of depression, mood lability, and suicidal thoughts, all responded well to the psychiatric medications, and the patient reports overall benefit other psychiatric hospitalization. Supportive psychotherapy was provided to the patient. The patient also participated in regular group therapy while hospitalized. Coping skills, problem solving as well as relaxation therapies were also part of the unit programming.  Labs were reviewed with the patient, and abnormal results were discussed with the patient.  The patient is able to verbalize their individual safety plan to this provider.  # It is recommended to the patient to continue psychiatric medications as prescribed, after discharge from the hospital.    # It is recommended to the patient to follow up with your outpatient psychiatric provider and PCP.  # It was discussed with the patient, the impact of alcohol, drugs, tobacco have been there overall psychiatric and medical wellbeing, and total abstinence from substance use was recommended the patient.ed.  # Prescriptions provided or sent directly  to preferred pharmacy at discharge. Patient agreeable to plan. Given opportunity to ask  questions. Appears to feel comfortable with discharge.    # In the event of worsening symptoms, the patient is instructed to call the crisis hotline, 911 and or go to the nearest ED for appropriate evaluation and treatment of symptoms. To follow-up with primary care provider for other medical issues, concerns and or health care needs  # Patient was discharged to home with CST team, with a plan to follow up as noted below.     Psychiatric Specialty Exam  Presentation  General Appearance:  Casual  Eye Contact: Good  Speech: Clear and Coherent; Normal Rate  Speech Volume: Normal  Handedness: Right   Mood and Affect  Mood: Euthymic; Anxious  Duration of Depression Symptoms: Greater than two weeks  Affect: Congruent   Thought Process  Thought Processes: Linear  Descriptions of Associations:Intact  Orientation:Full (Time, Place and Person)  Thought Content:Logical  History of Schizophrenia/Schizoaffective disorder:Yes  Duration of Psychotic Symptoms:Greater than six months  Hallucinations:Hallucinations: None  Ideas of Reference:None  Suicidal Thoughts:Suicidal Thoughts: No  Homicidal Thoughts:Homicidal Thoughts: No   Sensorium  Memory: Immediate Good; Recent Good; Remote Good  Judgment: Fair  Insight: Fair   Materials engineer: Fair  Attention Span: Fair  Recall: [place] of Knowledge: Fair  Language: Good   Psychomotor Activity  Psychomotor Activity: Psychomotor Activity: Normal   Assets  Assets: Communication Skills; Desire for Improvement; Physical Health   Sleep  Sleep: Sleep: Fair   Physical Exam: Physical Exam See dc summary  ROS See discharge summary  Blood pressure 127/71, pulse (!) 101, temperature 97.7 F (36.5 C), temperature source Oral, resp. rate 18, height '5\' 4"'$  (1.626 m), weight 90 kg, SpO2 99  %. Body mass index is 34.06 kg/m.  Mental Status Per Nursing Assessment::   On Admission:  Suicidal ideation indicated by patient  Demographic factors:  Caucasian, Living alone, Unemployed   Loss Factors:  Decline in physical health   Historical Factors:  Prior suicide attempts   Risk Reduction Factors:  Sense of responsibility to family  Continued Clinical Symptoms:  Schizoaffective d/o - mood is stable. Denying SI, HI, psychotic symptoms.   Cognitive Features That Contribute To Risk:  None    Suicide Risk:  Mild:  There are no identifiable suicide plans, no associated intent, mild dysphoria and related symptoms, good self-control (both objective and subjective assessment), few other risk factors, and identifiable protective factors, including available and accessible social support.    Follow-up Information     [facility]. Call.   Why: Please call your CST Team and inform them of your discharge from the  hospital. You can also reach Butch Penny, therapist, at [phone] or Jeanie at [phone]. Contact information: [place] [address] [place], [place] on 02/12/2022.   Why: You have an appointment for medication management services on 02/12/22 at 10:45 am. Contact information: [place] [address] [phone]                 Plan Of Care/Follow-up recommendations:   Activity: as tolerated  Diet: heart healthy  Other: -Follow-up with your outpatient psychiatric provider -instructions on appointment date, time, and address (location) are provided to you in discharge paperwork.  -Take your psychiatric medications as prescribed at discharge - instructions are provided to you in the discharge paperwork  -Follow-up with outpatient primary care doctor and other specialists -for management of preventative medicine and chronic medical disease  -  Recommend abstinence from alcohol,  tobacco, and other illicit drug use at discharge.   -If your psychiatric symptoms recur, worsen, or if you have side effects to your psychiatric medications, call your outpatient psychiatric provider, 911, [phone] or go to the nearest emergency department.  -If suicidal thoughts recur, call your outpatient psychiatric provider, 911, [phone] or go to the nearest emergency department.   Christoper Allegra, MD 02/06/2022, 9:54 AM

## 2022-02-07 ENCOUNTER — Telehealth: Payer: Self-pay | Admitting: Neurology

## 2022-02-07 NOTE — Telephone Encounter (Signed)
Telephone call to patient, Advised patient she will have schedule a visit with Dr.Jaffe first to discuss Botox.  Per Patient verbalized understanding and will like to be scheduled for next available. Unable to take NSIDS per Psychiatrist it inter act with her Lexapro.     Front desk please call patient to schedule a visit.

## 2022-02-07 NOTE — Telephone Encounter (Signed)
Left message with the after hour service o 02-07-22 at 1:15 pm    Pt use to get botox injections and stopped due to the pain and she wants to get them again she is not able to take ibuprofen any longer with other medication

## 2022-02-10 ENCOUNTER — Ambulatory Visit (HOSPITAL_COMMUNITY)
Admission: EM | Admit: 2022-02-10 | Discharge: 2022-02-11 | Disposition: A | Discharge status: Home / Self Care | Payer: PPO | Attending: Nurse Practitioner | Admitting: Nurse Practitioner

## 2022-02-10 DIAGNOSIS — F419 Anxiety disorder, unspecified: Secondary | ICD-10-CM | POA: Diagnosis not present

## 2022-02-10 DIAGNOSIS — Z1152 Encounter for screening for COVID-19: Secondary | ICD-10-CM | POA: Insufficient documentation

## 2022-02-10 DIAGNOSIS — Z87891 Personal history of nicotine dependence: Secondary | ICD-10-CM | POA: Insufficient documentation

## 2022-02-10 DIAGNOSIS — Z9151 Personal history of suicidal behavior: Secondary | ICD-10-CM | POA: Diagnosis not present

## 2022-02-10 DIAGNOSIS — M797 Fibromyalgia: Secondary | ICD-10-CM | POA: Insufficient documentation

## 2022-02-10 DIAGNOSIS — Z3202 Encounter for pregnancy test, result negative: Secondary | ICD-10-CM

## 2022-02-10 DIAGNOSIS — F603 Borderline personality disorder: Secondary | ICD-10-CM | POA: Insufficient documentation

## 2022-02-10 DIAGNOSIS — Z79899 Other long term (current) drug therapy: Secondary | ICD-10-CM | POA: Diagnosis not present

## 2022-02-10 DIAGNOSIS — F4310 Post-traumatic stress disorder, unspecified: Secondary | ICD-10-CM | POA: Insufficient documentation

## 2022-02-10 DIAGNOSIS — F250 Schizoaffective disorder, bipolar type: Secondary | ICD-10-CM | POA: Insufficient documentation

## 2022-02-10 DIAGNOSIS — F840 Autistic disorder: Secondary | ICD-10-CM | POA: Insufficient documentation

## 2022-02-10 DIAGNOSIS — R45851 Suicidal ideations: Secondary | ICD-10-CM | POA: Insufficient documentation

## 2022-02-10 DIAGNOSIS — Z7951 Long term (current) use of inhaled steroids: Secondary | ICD-10-CM | POA: Insufficient documentation

## 2022-02-10 LAB — POCT URINE DRUG SCREEN - MANUAL ENTRY (I-SCREEN)
POC Amphetamine UR: NOT DETECTED
POC Buprenorphine (BUP): NOT DETECTED
POC Cocaine UR: NOT DETECTED
POC Marijuana UR: NOT DETECTED
POC Methadone UR: NOT DETECTED
POC Methamphetamine UR: NOT DETECTED — AB
POC Morphine: NOT DETECTED
POC Oxazepam (BZO): NOT DETECTED
POC Oxycodone UR: NOT DETECTED
POC Secobarbital (BAR): NOT DETECTED

## 2022-02-10 LAB — RESP PANEL BY RT-PCR (RSV, FLU A&B, COVID)  RVPGX2
Influenza A by PCR: NEGATIVE
Influenza B by PCR: NEGATIVE
Resp Syncytial Virus by PCR: NEGATIVE
SARS Coronavirus 2 by RT PCR: NEGATIVE

## 2022-02-10 LAB — POCT PREGNANCY, URINE: Preg Test, Ur: NEGATIVE

## 2022-02-10 LAB — POC SARS CORONAVIRUS 2 AG: SARSCOV2ONAVIRUS 2 AG: NEGATIVE

## 2022-02-10 LAB — POC URINE PREG, ED: Preg Test, Ur: NEGATIVE

## 2022-02-10 MED ORDER — METOPROLOL SUCCINATE ER 25 MG PO TB24
12.5000 mg | ORAL_TABLET | Freq: Every day | ORAL | Status: DC
  Administered 2022-02-10 – 2022-02-11 (×2): 12.5 mg via ORAL
  Filled 2022-02-10 (×3): qty 1

## 2022-02-10 MED ORDER — ESCITALOPRAM OXALATE 10 MG PO TABS
10.0000 mg | ORAL_TABLET | Freq: Every day | ORAL | Status: DC
  Administered 2022-02-10 – 2022-02-11 (×2): 10 mg via ORAL
  Filled 2022-02-10 (×2): qty 1

## 2022-02-10 MED ORDER — ALUM & MAG HYDROXIDE-SIMETH 200-200-20 MG/5ML PO SUSP: 30.0000 mL | ORAL | Status: DC | PRN

## 2022-02-10 MED ORDER — MAGNESIUM HYDROXIDE 400 MG/5ML PO SUSP: 30.0000 mL | Freq: Every day | ORAL | Status: DC | PRN

## 2022-02-10 MED ORDER — MELATONIN 3 MG PO TABS
1.5000 mg | ORAL_TABLET | Freq: Every day | ORAL | Status: DC
  Administered 2022-02-10: 1.5 mg via ORAL
  Filled 2022-02-10: qty 1

## 2022-02-10 MED ORDER — HYDROXYZINE HCL 25 MG PO TABS
50.0000 mg | ORAL_TABLET | Freq: Three times a day (TID) | ORAL | Status: DC | PRN
  Administered 2022-02-10 (×2): 50 mg via ORAL
  Filled 2022-02-10 (×2): qty 2

## 2022-02-10 MED ORDER — PRAZOSIN HCL 1 MG PO CAPS: 1.0000 mg | ORAL_CAPSULE | Freq: Every day | ORAL | Status: DC

## 2022-02-10 MED ORDER — MELATONIN 1 MG PO TABS: 0.5000 mg | ORAL_TABLET | Freq: Every day | ORAL | Status: DC

## 2022-02-10 MED ORDER — MOMETASONE FURO-FORMOTEROL FUM 200-5 MCG/ACT IN AERO
2.0000 | INHALATION_SPRAY | Freq: Two times a day (BID) | RESPIRATORY_TRACT | Status: DC
  Administered 2022-02-10 – 2022-02-11 (×3): 2 via RESPIRATORY_TRACT
  Filled 2022-02-10: qty 8.8

## 2022-02-10 MED ORDER — HYDROXYZINE HCL 25 MG PO TABS
25.0000 mg | ORAL_TABLET | Freq: Every day | ORAL | Status: DC | PRN
  Administered 2022-02-10: 25 mg via ORAL
  Filled 2022-02-10: qty 1

## 2022-02-10 MED ORDER — PANTOPRAZOLE SODIUM 40 MG PO TBEC
40.0000 mg | DELAYED_RELEASE_TABLET | Freq: Every day | ORAL | Status: DC
  Administered 2022-02-10 – 2022-02-11 (×2): 40 mg via ORAL
  Filled 2022-02-10 (×2): qty 1

## 2022-02-10 MED ORDER — PRAZOSIN HCL 1 MG PO CAPS
1.0000 mg | ORAL_CAPSULE | Freq: Every day | ORAL | Status: DC
  Administered 2022-02-10: 1 mg via ORAL
  Filled 2022-02-10: qty 1

## 2022-02-10 MED ORDER — CARIPRAZINE HCL 3 MG PO CAPS
3.0000 mg | ORAL_CAPSULE | Freq: Every day | ORAL | Status: DC
  Administered 2022-02-10 – 2022-02-11 (×2): 3 mg via ORAL
  Filled 2022-02-10 (×2): qty 1

## 2022-02-10 MED ORDER — ALBUTEROL SULFATE HFA 108 (90 BASE) MCG/ACT IN AERS: 2.0000 | INHALATION_SPRAY | Freq: Four times a day (QID) | RESPIRATORY_TRACT | Status: DC | PRN

## 2022-02-10 NOTE — ED Notes (Signed)
Patient resting quietly in bed.  Respirations equal and unlabored, skin warm and dry, NAD. Routine safety checks conducted according to facility protocol. Will continue to monitor for safety.  

## 2022-02-10 NOTE — ED Provider Notes (Signed)
[name] 32 year old Caucasian female well-known to this service.  She was seen and evaluated by this provider as she continues to endorse suicidal ideations due to not having any Lyrica at home. "  If I discharged and just going to kill myself."  She reports she has a follow-up appointment this Tuesday 1/9 with her CST team and primary care provider for medication management.  Discussed continuing overnight observation.  Patient was receptive to plan.  Support, encouragement and  reassurance was provided.

## 2022-02-10 NOTE — ED Notes (Signed)
Patient resting quietly in bed with eyes closed. Respirations equal and unlabored, skin warm and dry, NAD. Routine safety checks conducted according to facility protocol. Will continue to monitor for safety.  
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
## 2022-02-10 NOTE — ED Notes (Signed)
Patient A&Ox4. Patient currently denies SI/HI and AVH with this Probation officer. Patient reports that she needs her Lyrica to be re-started that's why she is here.  Patient denies any physical complaints when asked. No acute distress noted. Support and encouragement provided. Routine safety checks conducted according to facility protocol. Encouraged patient to notify staff if thoughts of harm toward self or others arise. Patient verbalize understanding and agreement. Will continue to monitor for safety.

## 2022-02-10 NOTE — ED Notes (Signed)
Could not obtain blood on pt will report to day shift

## 2022-02-10 NOTE — Progress Notes (Signed)
   02/10/22 0141  [place] Triage Screening (Walk-ins at [facility] [place] only)  How Did You Hear About [name]? [employer] [facility] [place] [facility])  What Is the Reason for Your Visit/Call Today? Patient reports she has had worsening anxiety since having her Lyrica stopped last month. Patient endorses suicidal ideation with a plan to slit her throat.  How Long Has This Been Causing You Problems? 1-6 months  Have You Recently Had Any Thoughts About Hurting Yourself? Yes  How long ago did you have thoughts about hurting yourself? [place]  Are You Planning to Commit Suicide/Harm Yourself At This time? Yes  Have you Recently Had Thoughts About Hurting Someone [name]? No  Are You Planning To Harm Someone At This Time? No  Explanation: N/A  Are you currently experiencing any auditory, visual or other hallucinations? No (Reports heard a voice telling her to bang her head, 2 hours ago)  What Did You Use and How Much? N/A  Do you have any current medical co-morbidities that require immediate attention? No  Clinician description of patient physical appearance/behavior: Patient is wearing shorts and a t-shirt. Patient is having pressured speech and appears anxious  What Do You Feel Would Help You the Most Today? Treatment for Depression or other mood problem  If access to [facility] was not available, would you have sought care in the Emergency Department? No  Determination of Need Emergent (2 hours)  Options For Referral Inpatient Hospitalization

## 2022-02-10 NOTE — ED Notes (Signed)
Patient resting quietly in bed with eyes open. Respirations equal and unlabored, skin warm and dry, NAD. Routine safety checks conducted according to facility protocol. Will continue to monitor for safety.

## 2022-02-10 NOTE — ED Notes (Signed)
Pt  admitted to [facility] endorsing [place]. Denies HI & AVH.  Patient was cooperative during the admission assessment. Skin assessment complete. Belongings inventoried. Patient oriented to unit. Meal and drinks offered to patient.  Patient verbalized agreement to treatment plans. Patient verbally contracts for safety while hospitalized. Will monitor for safety.

## 2022-02-10 NOTE — ED Notes (Signed)
Pt sleeping in recliner bed. Resp even and unlabored. Will monitor for safety

## 2022-02-10 NOTE — ED Provider Notes (Signed)
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 140  135 - 145 mmol/L Final   Potassium 01/21/2022 3.9  3.5 - 5.1 mmol/L Final   Chloride 01/21/2022 110  98 - 111 mmol/L Final   CO2 01/21/2022 19 (L)  22 - 32 mmol/L Final   Glucose, Bld 01/21/2022 98  70 - 99 mg/dL Final   Glucose reference range applies only to samples taken after fasting for at least 8 hours.   BUN 01/21/2022 7  6 - 20 mg/dL Final   Creatinine, Ser 01/21/2022 0.60  0.44 - 1.00 mg/dL Final   Calcium 01/21/2022 9.2  8.9 - 10.3 mg/dL Final   GFR, Estimated 01/21/2022 >60  >60 mL/min Final   Comment: (NOTE) Calculated using the CKD-EPI Creatinine Equation (2021)    Anion gap 01/21/2022 11  5 - 15 Final   Performed at [facility], [place] [address], [place], [place] [address]   WBC 01/21/2022 6.7  4.0 - 10.5 K/uL Final   RBC 01/21/2022 4.94  3.87 - 5.11 MIL/uL Final   Hemoglobin 01/21/2022 12.7  12.0 - 15.0 g/dL Final   HCT 01/21/2022 40.9  36.0 - 46.0 % Final   MCV 01/21/2022 82.8  80.0 - 100.0 fL Final   MCH 01/21/2022 25.7 (L)  26.0 - 34.0 pg Final   MCHC 01/21/2022 31.1  30.0 - 36.0 g/dL Final   RDW 01/21/2022 13.7  11.5 - 15.5 % Final   Platelets 01/21/2022 328  150 - 400 K/uL Final   nRBC 01/21/2022 0.0  0.0 - 0.2 % Final   Performed at [facility] [address], [place], [place] [address]   Troponin I (High Sensitivity) 01/21/2022 <2  <18 ng/L Final   Comment: (NOTE) Elevated high sensitivity troponin I (hsTnI) values and significant  changes across serial measurements may suggest ACS but many other  chronic and acute conditions are known to elevate hsTnI results.  Refer to the "Links" section for chest pain algorithms and additional  guidance. Performed at [facility], [place]  [address], [place], [place] [address]    I-stat hCG, quantitative 01/21/2022 <5.0  <5 mIU/mL Final   Comment 3 01/21/2022          Final   Comment:   GEST. AGE      CONC.  (mIU/mL)   <=1 WEEK        5 - 50     2  WEEKS       50 - 500     3 WEEKS       100 - 10,000     4 WEEKS     1,000 - 30,000        FEMALE AND NON-PREGNANT FEMALE:     LESS THAN 5 mIU/mL    Troponin I (High Sensitivity) 01/22/2022 <2  <18 ng/L Final   Comment: (NOTE) Elevated high sensitivity troponin I (hsTnI) values and significant  changes across serial measurements may suggest ACS but many other  chronic and acute conditions are known to elevate hsTnI results.  Refer to the "Links" section for chest pain algorithms and additional  guidance. Performed at [facility], [place] [address], [place], [place] [address]   Admission on 01/18/2022, Discharged on 01/18/2022  Component Date Value Ref Range Status   WBC 01/18/2022 7.1  4.0 - 10.5 K/uL Final   RBC 01/18/2022 4.59  3.87 - 5.11 MIL/uL Final   Hemoglobin 01/18/2022 11.6 (L)  12.0 - 15.0 g/dL Final   HCT 01/18/2022 37.2  36.0 - 46.0 % Final   MCV 01/18/2022 81.0  80.0 - 100.0 fL Final   MCH 01/18/2022 25.3 (L)  26.0 - 34.0 pg Final   MCHC 01/18/2022 31.2  30.0 - 36.0 g/dL Final   RDW 01/18/2022 14.2  11.5 - 15.5 % Final   Platelets 01/18/2022 293  150 - 400 K/uL Final   nRBC 01/18/2022 0.0  0.0 - 0.2 % Final   Neutrophils Relative % 01/18/2022 82  % Final   Neutro Abs 01/18/2022 5.8  1.7 - 7.7 K/uL Final   Lymphocytes Relative 01/18/2022 10  % Final   Lymphs Abs 01/18/2022 0.7  0.7 - 4.0 K/uL Final   Monocytes Relative 01/18/2022 8  % Final   Monocytes Absolute 01/18/2022 0.5  0.1 - 1.0 K/uL Final   Eosinophils Relative 01/18/2022 0  % Final   Eosinophils Absolute 01/18/2022 0.0  0.0 - 0.5 K/uL Final   Basophils Relative 01/18/2022 0  % Final   Basophils Absolute 01/18/2022 0.0  0.0 - 0.1 K/uL Final   Immature Granulocytes 01/18/2022 0  % Final   Abs Immature Granulocytes 01/18/2022 0.03  0.00 - 0.07 K/uL Final   Performed at [facility], [place] [address], [place], [place] [address]   Sodium 01/18/2022 138  135 - 145 mmol/L Final   Potassium 01/18/2022 3.6   3.5 - 5.1 mmol/L Final   Chloride 01/18/2022 107  98 - 111 mmol/L Final   CO2 01/18/2022 21 (L)  22 - 32 mmol/L Final   Glucose, Bld 01/18/2022 103 (H)  70 - 99 mg/dL Final   Glucose reference range applies only to samples taken after fasting for at least 8 hours.   BUN 01/18/2022 10  6 - 20 mg/dL Final   Creatinine, Ser 01/18/2022 0.79  0.44 - 1.00 mg/dL Final   Calcium 01/18/2022 9.0  8.9 - 10.3 mg/dL Final   Total Protein 01/18/2022 7.1  6.5 - 8.1 g/dL Final   Albumin 01/18/2022 3.7  3.5 - 5.0  g/dL Final   AST 01/18/2022 27  15 - 41 U/L Final   ALT 01/18/2022 19  0 - 44 U/L Final   Alkaline Phosphatase 01/18/2022 74  38 - 126 U/L Final   Total Bilirubin 01/18/2022 0.3  0.3 - 1.2 mg/dL Final   GFR, Estimated 01/18/2022 >60  >60 mL/min Final   Comment: (NOTE) Calculated using the CKD-EPI Creatinine Equation (2021)    Anion gap 01/18/2022 10  5 - 15 Final   Performed at [facility], [place] [address], [place], [place] [address]   SARS Coronavirus 2 by RT PCR 01/18/2022 POSITIVE (A)  NEGATIVE Final   Comment: (NOTE) SARS-CoV-2 target nucleic acids are DETECTED.  The SARS-CoV-2 RNA is generally detectable in upper respiratory specimens during the acute phase of infection. Positive results are indicative of the presence of the identified virus, but do not rule out bacterial infection or co-infection with other pathogens not detected by the test. Clinical correlation with patient history and other diagnostic information is necessary to determine patient infection status. The expected result is Negative.  Fact Sheet for Patients: EntrepreneurPulse.com.au  Fact Sheet for Healthcare Providers: IncredibleEmployment.be  This test is not yet approved or cleared by the [place] FDA and  has been authorized for detection and/or diagnosis of SARS-CoV-2 by FDA under an Emergency Use Authorization (EUA).  This EUA will remain in effect (meaning  this test can be used) for the duration of  the COVID-19 declaration under Section 564(b)(1) of the A                          ct, 21 U.S.C. section 360bbb-3(b)(1), unless the authorization is terminated or revoked sooner.     Influenza A by PCR 01/18/2022 NEGATIVE  NEGATIVE Final   Influenza B by PCR 01/18/2022 NEGATIVE  NEGATIVE Final   Comment: (NOTE) The Xpert Xpress SARS-CoV-2/FLU/RSV plus assay is intended as an aid in the diagnosis of influenza from Nasopharyngeal swab specimens and should not be used as a sole basis for treatment. Nasal washings and aspirates are unacceptable for Xpert Xpress SARS-CoV-2/FLU/RSV testing.  Fact Sheet for Patients: EntrepreneurPulse.com.au  Fact Sheet for Healthcare Providers: IncredibleEmployment.be  This test is not yet approved or cleared by the [place] FDA and has been authorized for detection and/or diagnosis of SARS-CoV-2 by FDA under an Emergency Use Authorization (EUA). This EUA will remain in effect (meaning this test can be used) for the duration of the COVID-19 declaration under Section 564(b)(1) of the Act, 21 U.S.C. section 360bbb-3(b)(1), unless the authorization is terminated or revoked.     Resp Syncytial Virus by PCR 01/18/2022 NEGATIVE  NEGATIVE Final   Comment: (NOTE) Fact Sheet for Patients: EntrepreneurPulse.com.au  Fact Sheet for Healthcare Providers: IncredibleEmployment.be  This test is not yet approved or cleared by the [place] FDA and has been authorized for detection and/or diagnosis of SARS-CoV-2 by FDA under an Emergency Use Authorization (EUA). This EUA will remain in effect (meaning this test can be used) for the duration of the COVID-19 declaration under Section 564(b)(1) of the Act, 21 U.S.C. section 360bbb-3(b)(1), unless the authorization is terminated or revoked.  Performed at [facility], [place] [address], [place], [place] [address]    Lipase 01/18/2022 29  11 - 51 U/L Final   Performed at [facility] [address], [place], [place] [address]  Admission on 12/27/2021, Discharged on 01/01/2022  Component Date Value  Ref Range Status   SARS Coronavirus 2 by RT PCR 12/28/2021 NEGATIVE  NEGATIVE Final   Comment: (NOTE) SARS-CoV-2 target nucleic acids are NOT DETECTED.  The SARS-CoV-2 RNA is generally detectable in upper respiratory specimens during the acute phase of infection. The lowest concentration of SARS-CoV-2 viral copies this assay can detect is 138 copies/mL. A negative result does not preclude SARS-Cov-2 infection and should not be used as the sole basis for treatment or other patient management decisions. A negative result may occur with  improper specimen collection/handling, submission of specimen other than nasopharyngeal swab, presence of viral mutation(s) within the areas targeted by this assay, and inadequate number of viral copies(<138 copies/mL). A negative result must be combined with clinical observations, patient history, and epidemiological information. The expected result is Negative.  Fact Sheet for Patients:  EntrepreneurPulse.com.au  Fact Sheet for Healthcare Providers:  IncredibleEmployment.be  This test is no                          t yet approved or cleared by the [place] FDA and  has been authorized for detection and/or diagnosis of SARS-CoV-2 by FDA under an Emergency Use Authorization (EUA). This EUA will remain  in effect (meaning this test can be used) for the duration of the COVID-19 declaration under Section 564(b)(1) of the Act, 21 U.S.C.section 360bbb-3(b)(1), unless the authorization is terminated  or revoked sooner.       Influenza A by PCR 12/28/2021 NEGATIVE  NEGATIVE Final   Influenza B by PCR 12/28/2021 NEGATIVE  NEGATIVE Final   Comment: (NOTE) The Xpert Xpress SARS-CoV-2/FLU/RSV plus  assay is intended as an aid in the diagnosis of influenza from Nasopharyngeal swab specimens and should not be used as a sole basis for treatment. Nasal washings and aspirates are unacceptable for Xpert Xpress SARS-CoV-2/FLU/RSV testing.  Fact Sheet for Patients: EntrepreneurPulse.com.au  Fact Sheet for Healthcare Providers: IncredibleEmployment.be  This test is not yet approved or cleared by the [place] FDA and has been authorized for detection and/or diagnosis of SARS-CoV-2 by FDA under an Emergency Use Authorization (EUA). This EUA will remain in effect (meaning this test can be used) for the duration of the COVID-19 declaration under Section 564(b)(1) of the Act, 21 U.S.C. section 360bbb-3(b)(1), unless the authorization is terminated or revoked.  Performed at [facility], [place] [address], [place], [place] [address]    Sodium 12/28/2021 142  135 - 145 mmol/L Final   Potassium 12/28/2021 3.8  3.5 - 5.1 mmol/L Final   Chloride 12/28/2021 110  98 - 111 mmol/L Final   BUN 12/28/2021 <3 (L)  6 - 20 mg/dL Final   Creatinine, Ser 12/28/2021 0.60  0.44 - 1.00 mg/dL Final   Glucose, Bld 12/28/2021 91  70 - 99 mg/dL Final   Glucose reference range applies only to samples taken after fasting for at least 8 hours.   Calcium, Ion 12/28/2021 1.12 (L)  1.15 - 1.40 mmol/L Final   TCO2 12/28/2021 21 (L)  22 - 32 mmol/L Final   Hemoglobin 12/28/2021 10.5 (L)  12.0 - 15.0 g/dL Final   HCT 12/28/2021 31.0 (L)  36.0 - 46.0 % Final   I-stat hCG, quantitative 12/28/2021 <5.0  <5 mIU/mL Final   Comment 3 12/28/2021          Final   Comment:   GEST. AGE      CONC.  (mIU/mL)   <=1 WEEK  5 - 50     2 WEEKS       50 - 500     3 WEEKS       100 - 10,000     4 WEEKS     1,000 - 30,000        FEMALE AND NON-PREGNANT FEMALE:     LESS THAN 5 mIU/mL    Sodium 12/28/2021 141  135 - 145 mmol/L Final   Potassium 12/28/2021 3.7  3.5 - 5.1 mmol/L Final    Chloride 12/28/2021 112 (H)  98 - 111 mmol/L Final   CO2 12/28/2021 21 (L)  22 - 32 mmol/L Final   Glucose, Bld 12/28/2021 93  70 - 99 mg/dL Final   Glucose reference range applies only to samples taken after fasting for at least 8 hours.   BUN 12/28/2021 <5 (L)  6 - 20 mg/dL Final   Creatinine, Ser 12/28/2021 0.70  0.44 - 1.00 mg/dL Final   Calcium 12/28/2021 8.1 (L)  8.9 - 10.3 mg/dL Final   Total Protein 12/28/2021 5.6 (L)  6.5 - 8.1 g/dL Final   Albumin 12/28/2021 3.2 (L)  3.5 - 5.0 g/dL Final   AST 12/28/2021 15  15 - 41 U/L Final   ALT 12/28/2021 12  0 - 44 U/L Final   Alkaline Phosphatase 12/28/2021 56  38 - 126 U/L Final   Total Bilirubin 12/28/2021 0.3  0.3 - 1.2 mg/dL Final   GFR, Estimated 12/28/2021 >60  >60 mL/min Final   Comment: (NOTE) Calculated using the CKD-EPI Creatinine Equation (2021)    Anion gap 12/28/2021 8  5 - 15 Final   Performed at [facility] [address] [place], [place] [address]   WBC 12/28/2021 6.8  4.0 - 10.5 K/uL Final   RBC 12/28/2021 4.16  3.87 - 5.11 MIL/uL Final   Hemoglobin 12/28/2021 10.6 (L)  12.0 - 15.0 g/dL Final   HCT 12/28/2021 34.7 (L)  36.0 - 46.0 % Final   MCV 12/28/2021 83.4  80.0 - 100.0 fL Final   MCH 12/28/2021 25.5 (L)  26.0 - 34.0 pg Final   MCHC 12/28/2021 30.5  30.0 - 36.0 g/dL Final   RDW 12/28/2021 13.5  11.5 - 15.5 % Final   Platelets 12/28/2021 142 (L)  150 - 400 K/uL Final   REPEATED TO VERIFY   nRBC 12/28/2021 0.0  0.0 - 0.2 % Final   Neutrophils Relative % 12/28/2021 52  % Final   Neutro Abs 12/28/2021 3.5  1.7 - 7.7 K/uL Final   Lymphocytes Relative 12/28/2021 41  % Final   Lymphs Abs 12/28/2021 2.8  0.7 - 4.0 K/uL Final   Monocytes Relative 12/28/2021 6  % Final   Monocytes Absolute 12/28/2021 0.4  0.1 - 1.0 K/uL Final   Eosinophils Relative 12/28/2021 1  % Final   Eosinophils Absolute 12/28/2021 0.0  0.0 - 0.5 K/uL Final   Basophils Relative 12/28/2021 0  % Final   Basophils Absolute 12/28/2021 0.0   0.0 - 0.1 K/uL Final   Immature Granulocytes 12/28/2021 0  % Final   Abs Immature Granulocytes 12/28/2021 0.02  0.00 - 0.07 K/uL Final   Performed at [facility], [place] [address], [place], [place] [address]   Color, Urine 12/28/2021 YELLOW  YELLOW Final   APPearance 12/28/2021 CLEAR  CLEAR Final   Specific Gravity, Urine 12/28/2021 1.006  1.005 - 1.030 Final   pH 12/28/2021 8.0  5.0 - 8.0 Final  Glucose, UA 12/28/2021 NEGATIVE  NEGATIVE mg/dL Final   Hgb urine dipstick 12/28/2021 NEGATIVE  NEGATIVE Final   Bilirubin Urine 12/28/2021 NEGATIVE  NEGATIVE Final   Ketones, ur 12/28/2021 NEGATIVE  NEGATIVE mg/dL Final   Protein, ur 12/28/2021 NEGATIVE  NEGATIVE mg/dL Final   Nitrite 12/28/2021 NEGATIVE  NEGATIVE Final   Leukocytes,Ua 12/28/2021 SMALL (A)  NEGATIVE Final   RBC / HPF 12/28/2021 0-5  0 - 5 RBC/hpf Final   WBC, UA 12/28/2021 0-5  0 - 5 WBC/hpf Final   Bacteria, UA 12/28/2021 RARE (A)  NONE SEEN Final   Squamous Epithelial / HPF 12/28/2021 6-10  0 - 5 Final   Performed at [facility], [place] [address] [place], [place] [address]   Salicylate Lvl 25/95/6387 <7.0 (L)  7.0 - 30.0 mg/dL Final   Performed at [facility] [address] [place], [place] [address]   Acetaminophen (Tylenol), Serum 12/28/2021 <10 (L)  10 - 30 ug/mL Final   Comment: (NOTE) Therapeutic concentrations vary significantly. A range of 10-30 ug/mL  may be an effective concentration for many patients. However, some  are best treated at concentrations outside of this range. Acetaminophen concentrations >150 ug/mL at 4 hours after ingestion  and >50 ug/mL at 12 hours after ingestion are often associated with  toxic reactions.  Performed at [facility], [place] [address], [place], [place] [address]    Opiates 12/28/2021 NONE DETECTED  NONE DETECTED Final   Cocaine 12/28/2021 NONE DETECTED  NONE DETECTED Final   Benzodiazepines 12/28/2021 NONE DETECTED  NONE DETECTED Final   Amphetamines  12/28/2021 NONE DETECTED  NONE DETECTED Final   Tetrahydrocannabinol 12/28/2021 NONE DETECTED  NONE DETECTED Final   Barbiturates 12/28/2021 NONE DETECTED  NONE DETECTED Final   Comment: (NOTE) DRUG SCREEN FOR MEDICAL PURPOSES ONLY.  IF CONFIRMATION IS NEEDED FOR ANY PURPOSE, NOTIFY LAB WITHIN 5 DAYS.  LOWEST DETECTABLE LIMITS FOR URINE DRUG SCREEN Drug Class                     Cutoff (ng/mL) Amphetamine and metabolites    1000 Barbiturate and metabolites    200 Benzodiazepine                 200 Opiates and metabolites        300 Cocaine and metabolites        300 THC                            50 Performed at [facility], [place] [address], [place], [place] [address]    WBC 12/28/2021 8.5  4.0 - 10.5 K/uL Final   RBC 12/28/2021 4.31  3.87 - 5.11 MIL/uL Final   Hemoglobin 12/28/2021 11.2 (L)  12.0 - 15.0 g/dL Final   HCT 12/28/2021 36.0  36.0 - 46.0 % Final   MCV 12/28/2021 83.5  80.0 - 100.0 fL Final   MCH 12/28/2021 26.0  26.0 - 34.0 pg Final   MCHC 12/28/2021 31.1  30.0 - 36.0 g/dL Final   RDW 12/28/2021 13.6  11.5 - 15.5 % Final   Platelets 12/28/2021 262  150 - 400 K/uL Final   nRBC 12/28/2021 0.0  0.0 - 0.2 % Final   Performed at [facility] [address] [place], [place] [address]   Sodium 12/28/2021 141  135 - 145 mmol/L Final   Potassium 12/28/2021 3.9  3.5 - 5.1 mmol/L Final   Chloride 12/28/2021 110  98 - 111 mmol/L Final   CO2 12/28/2021 19 (L)  22 - 32 mmol/L Final   Glucose, Bld 12/28/2021 103 (H)  70 - 99 mg/dL Final   Glucose reference range applies only to samples taken after fasting for at least 8 hours.   BUN 12/28/2021 <5 (L)  6 - 20 mg/dL Final   Creatinine, Ser 12/28/2021 0.73  0.44 - 1.00 mg/dL Final   Calcium 12/28/2021 8.1 (L)  8.9 - 10.3 mg/dL Final   GFR, Estimated 12/28/2021 >60  >60 mL/min Final   Comment: (NOTE) Calculated using the CKD-EPI Creatinine Equation (2021)    Anion gap 12/28/2021 12  5 - 15 Final   Performed at  [facility], [place] [address], [place], [place] [address]   Magnesium 12/28/2021 1.8  1.7 - 2.4 mg/dL Final   Performed at [facility] [address] [place], [place] [address]   Phosphorus 12/28/2021 3.1  2.5 - 4.6 mg/dL Final   Performed at [facility] [address], [place], [place] [address]   Total Protein 12/28/2021 5.9 (L)  6.5 - 8.1 g/dL Final   Albumin 12/28/2021 3.3 (L)  3.5 - 5.0 g/dL Final   AST 12/28/2021 17  15 - 41 U/L Final   ALT 12/28/2021 11  0 - 44 U/L Final   Alkaline Phosphatase 12/28/2021 57  38 - 126 U/L Final   Total Bilirubin 12/28/2021 0.3  0.3 - 1.2 mg/dL Final   Bilirubin, Direct 12/28/2021 <0.1  0.0 - 0.2 mg/dL Final   Indirect Bilirubin 12/28/2021 NOT CALCULATED  0.3 - 0.9 mg/dL Final   Performed at [facility] [address] [place], [place] [address]   MRSA by PCR Next Gen 12/28/2021 NOT DETECTED  NOT DETECTED Final   Comment: (NOTE) The GeneXpert MRSA Assay (FDA approved for NASAL specimens only), is one component of a comprehensive MRSA colonization surveillance program. It is not intended to diagnose MRSA infection nor to guide or monitor treatment for MRSA infections. Test performance is not FDA approved in patients less than 54 years old. Performed at [facility], [place] [address] [place], [place] [address]    WBC 12/30/2021 10.9 (H)  4.0 - 10.5 K/uL Final   RBC 12/30/2021 5.01  3.87 - 5.11 MIL/uL Final   Hemoglobin 12/30/2021 13.1  12.0 - 15.0 g/dL Final   HCT 12/30/2021 40.1  36.0 - 46.0 % Final   MCV 12/30/2021 80.0  80.0 - 100.0 fL Final   MCH 12/30/2021 26.1  26.0 - 34.0 pg Final   MCHC 12/30/2021 32.7  30.0 - 36.0 g/dL Final   RDW 12/30/2021 13.9  11.5 - 15.5 % Final   Platelets 12/30/2021 254  150 - 400 K/uL Final   nRBC 12/30/2021 0.0  0.0 - 0.2 % Final   Performed at [facility] [address] [place], [place] [address]   Sodium 12/30/2021 136  135 - 145 mmol/L Final   Potassium 12/30/2021  3.6  3.5 - 5.1 mmol/L Final   Chloride 12/30/2021 105  98 - 111 mmol/L Final   CO2 12/30/2021 18 (L)  22 - 32 mmol/L Final   Glucose, Bld 12/30/2021 106 (H)  70 - 99 mg/dL Final   Glucose reference range applies only to samples taken after fasting for at least 8 hours.   BUN 12/30/2021 10  6 - 20 mg/dL  Final   Creatinine, Ser 12/30/2021 0.70  0.44 - 1.00 mg/dL Final   Calcium 12/30/2021 9.4  8.9 - 10.3 mg/dL Final   GFR, Estimated 12/30/2021 >60  >60 mL/min Final   Comment: (NOTE) Calculated using the CKD-EPI Creatinine Equation (2021)    Anion gap 12/30/2021 13  5 - 15 Final   Performed at [facility] [address] [place], [place] [address]   Magnesium 12/30/2021 1.9  1.7 - 2.4 mg/dL Final   Performed at [facility] [address], [place], [place] [address]   Phosphorus 12/30/2021 4.1  2.5 - 4.6 mg/dL Final   Performed at [facility] [address] [place], [place] [address]   Total Protein 12/30/2021 7.7  6.5 - 8.1 g/dL Final   Albumin 12/30/2021 4.3  3.5 - 5.0 g/dL Final   AST 12/30/2021 19  15 - 41 U/L Final   ALT 12/30/2021 11  0 - 44 U/L Final   Alkaline Phosphatase 12/30/2021 65  38 - 126 U/L Final   Total Bilirubin 12/30/2021 0.3  0.3 - 1.2 mg/dL Final   Bilirubin, Direct 12/30/2021 <0.1  0.0 - 0.2 mg/dL Final   Indirect Bilirubin 12/30/2021 NOT CALCULATED  0.3 - 0.9 mg/dL Final   Performed at [facility], [place] [address], [place],  [address]   SARS Coronavirus 2 by RT PCR 12/30/2021 NEGATIVE  NEGATIVE Final   Comment: (NOTE) SARS-CoV-2 target nucleic acids are NOT DETECTED.  The SARS-CoV-2 RNA is generally detectable in upper and lower respiratory specimens during the acute phase of infection. The lowest concentration of SARS-CoV-2 viral copies this assay can detect is 250 copies / mL. A negative result does not preclude SARS-CoV-2 infection and should not be used as the sole basis for treatment or other patient management  decisions.  A negative result may occur with improper specimen collection / handling, submission of specimen other than nasopharyngeal swab, presence of viral mutation(s) within the areas targeted by this assay, and inadequate number of viral copies (<250 copies / mL). A negative result must be combined with clinical observations, patient history, and epidemiological information.  Fact Sheet for Patients:   https://www.patel.info/  Fact Sheet for Healthcare Providers: https://hall.com/  This test is not yet approved or                           cleared by the [place] FDA and has been authorized for detection and/or diagnosis of SARS-CoV-2 by FDA under an Emergency Use Authorization (EUA).  This EUA will remain in effect (meaning this test can be used) for the duration of the COVID-19 declaration under Section 564(b)(1) of the Act, 21 U.S.C. section 360bbb-3(b)(1), unless the authorization is terminated or revoked sooner.  Performed at [facility], [place] [address], [place], [place] [address]    WBC 01/01/2022 8.7  4.0 - 10.5 K/uL Final   RBC 01/01/2022 4.72  3.87 - 5.11 MIL/uL Final   Hemoglobin 01/01/2022 12.1  12.0 - 15.0 g/dL Final   HCT 01/01/2022 37.6  36.0 - 46.0 % Final   MCV 01/01/2022 79.7 (L)  80.0 - 100.0 fL Final   MCH 01/01/2022 25.6 (L)  26.0 - 34.0 pg Final   MCHC 01/01/2022 32.2  30.0 - 36.0 g/dL Final   RDW 01/01/2022 13.8  11.5 - 15.5 % Final   Platelets 01/01/2022 271  150 - 400 K/uL Final  nRBC 01/01/2022 0.0  0.0 - 0.2 % Final   Performed at [facility], [place] [address], [place], [place] [address]   Sodium 01/01/2022 135  135 - 145 mmol/L Final   Potassium 01/01/2022 3.4 (L)  3.5 - 5.1 mmol/L Final   Chloride 01/01/2022 104  98 - 111 mmol/L Final   CO2 01/01/2022 21 (L)  22 - 32 mmol/L Final   Glucose, Bld 01/01/2022 106 (H)  70 - 99 mg/dL Final   Glucose reference range applies only to samples  taken after fasting for at least 8 hours.   BUN 01/01/2022 10  6 - 20 mg/dL Final   Creatinine, Ser 01/01/2022 0.70  0.44 - 1.00 mg/dL Final   Calcium 01/01/2022 9.2  8.9 - 10.3 mg/dL Final   GFR, Estimated 01/01/2022 >60  >60 mL/min Final   Comment: (NOTE) Calculated using the CKD-EPI Creatinine Equation (2021)    Anion gap 01/01/2022 10  5 - 15 Final   Performed at [facility], [place] [address], [place], [place] [address]   Magnesium 01/01/2022 2.0  1.7 - 2.4 mg/dL Final   Performed at [facility] [address] [place], [place] [address]   Phosphorus 01/01/2022 3.8  2.5 - 4.6 mg/dL Final   Performed at [facility] [address], [place], [place] [address]   Total Protein 01/01/2022 7.5  6.5 - 8.1 g/dL Final   Albumin 01/01/2022 4.0  3.5 - 5.0 g/dL Final   AST 01/01/2022 18  15 - 41 U/L Final   ALT 01/01/2022 14  0 - 44 U/L Final   Alkaline Phosphatase 01/01/2022 57  38 - 126 U/L Final   Total Bilirubin 01/01/2022 0.7  0.3 - 1.2 mg/dL Final   Bilirubin, Direct 01/01/2022 <0.1  0.0 - 0.2 mg/dL Final   Indirect Bilirubin 01/01/2022 NOT CALCULATED  0.3 - 0.9 mg/dL Final   Performed at [facility], [place] [address], [place],  [address]   SARS Coronavirus 2 by RT PCR 01/01/2022 NEGATIVE  NEGATIVE Final   Comment: (NOTE) SARS-CoV-2 target nucleic acids are NOT DETECTED.  The SARS-CoV-2 RNA is generally detectable in upper and lower respiratory specimens during the acute phase of infection. The lowest concentration of SARS-CoV-2 viral copies this assay can detect is 250 copies / mL. A negative result does not preclude SARS-CoV-2 infection and should not be used as the sole basis for treatment or other patient management decisions.  A negative result may occur with improper specimen collection / handling, submission of specimen other than nasopharyngeal swab, presence of viral mutation(s) within the areas targeted by this assay, and inadequate number of  viral copies (<250 copies / mL). A negative result must be combined with clinical observations, patient history, and epidemiological information.  Fact Sheet for Patients:   https://www.patel.info/  Fact Sheet for Healthcare Providers: https://hall.com/  This test is not yet approved or                           cleared by the [place] FDA and has been authorized for detection and/or diagnosis of SARS-CoV-2 by FDA under an Emergency Use Authorization (EUA).  This EUA will remain in effect (meaning this test can be used) for the duration of the COVID-19 declaration under Section 564(b)(1) of the Act, 21 U.S.C. section 360bbb-3(b)(1), unless the authorization is terminated or revoked sooner.  Performed at [facility],  [address] [place], [place] [address]   Admission on 12/12/2021, Discharged on 12/13/2021  Component Date Value Ref Range Status   Sodium 12/12/2021 139  135 - 145 mmol/L Final   Potassium 12/12/2021 3.8  3.5 - 5.1 mmol/L Final   Chloride 12/12/2021 108  98 - 111 mmol/L Final   CO2 12/12/2021 18 (L)  22 - 32 mmol/L Final   Glucose, Bld 12/12/2021 106 (H)  70 - 99 mg/dL Final   Glucose reference range applies only to samples taken after fasting for at least 8 hours.   BUN 12/12/2021 7  6 - 20 mg/dL Final   Creatinine, Ser 12/12/2021 0.75  0.44 - 1.00 mg/dL Final   Calcium 12/12/2021 9.4  8.9 - 10.3 mg/dL Final   GFR, Estimated 12/12/2021 >60  >60 mL/min Final   Comment: (NOTE) Calculated using the CKD-EPI Creatinine Equation (2021)    Anion gap 12/12/2021 13  5 - 15 Final   Performed at [facility], [place] [address] [place], [place] [address]   WBC 12/12/2021 11.7 (H)  4.0 - 10.5 K/uL Final   RBC 12/12/2021 4.84  3.87 - 5.11 MIL/uL Final   Hemoglobin 12/12/2021 12.5  12.0 - 15.0 g/dL Final   HCT 12/12/2021 39.1  36.0 - 46.0 % Final   MCV 12/12/2021 80.8  80.0 - 100.0 fL Final   MCH 12/12/2021 25.8  (L)  26.0 - 34.0 pg Final   MCHC 12/12/2021 32.0  30.0 - 36.0 g/dL Final   RDW 12/12/2021 13.7  11.5 - 15.5 % Final   Platelets 12/12/2021 325  150 - 400 K/uL Final   nRBC 12/12/2021 0.0  0.0 - 0.2 % Final   Performed at [facility] [address], [place], [place] [address]   Troponin I (High Sensitivity) 12/12/2021 8  <18 ng/L Final   Comment: (NOTE) Elevated high sensitivity troponin I (hsTnI) values and significant  changes across serial measurements may suggest ACS but many other  chronic and acute conditions are known to elevate hsTnI results.  Refer to the "Links" section for chest pain algorithms and additional  guidance. Performed at [facility], [place] [address] [place], [place] [address]    I-stat hCG, quantitative 12/12/2021 <5.0  <5 mIU/mL Final   Comment 3 12/12/2021          Final   Comment:   GEST. AGE      CONC.  (mIU/mL)   <=1 WEEK        5 - 50     2 WEEKS       50 - 500     3 WEEKS       100 - 10,000     4 WEEKS     1,000 - 30,000        FEMALE AND NON-PREGNANT FEMALE:     LESS THAN 5 mIU/mL    Troponin I (High Sensitivity) 12/13/2021 4  <18 ng/L Final   Comment: (NOTE) Elevated high sensitivity troponin I (hsTnI) values and significant  changes across serial measurements may suggest ACS but many other  chronic and acute conditions are known to elevate hsTnI results.  Refer to the "Links" section for chest pain algorithms and additional  guidance. Performed at [facility], [place] [address] [place], [place] [address]   Admission on 12/10/2021, Discharged on 12/11/2021  Component Date Value Ref Range Status   SARS Coronavirus 2 by RT PCR 12/10/2021 NEGATIVE  NEGATIVE Final   Comment: (NOTE) SARS-CoV-2 target nucleic acids are NOT DETECTED.  The SARS-CoV-2 RNA is generally detectable in upper respiratory specimens during the acute phase of infection. The lowest concentration of SARS-CoV-2 viral copies this assay can detect is 138  copies/mL. A negative result does not preclude SARS-Cov-2 infection and should not be used as the sole basis for treatment or other patient management decisions. A negative result may occur with  improper specimen collection/handling, submission of specimen other than nasopharyngeal swab, presence of viral mutation(s) within the areas targeted by this assay, and inadequate number of viral copies(<138 copies/mL). A negative result must be combined with clinical observations, patient history, and epidemiological information. The expected result is Negative.  Fact Sheet for Patients:  EntrepreneurPulse.com.au  Fact Sheet for Healthcare Providers:  IncredibleEmployment.be  This test is no                          t yet approved or cleared by the [place] FDA and  has been authorized for detection and/or diagnosis of SARS-CoV-2 by FDA under an Emergency Use Authorization (EUA). This EUA will remain  in effect (meaning this test can be used) for the duration of the COVID-19 declaration under Section 564(b)(1) of the Act, 21 U.S.C.section 360bbb-3(b)(1), unless the authorization is terminated  or revoked sooner.       Influenza A by PCR 12/10/2021 NEGATIVE  NEGATIVE Final   Influenza B by PCR 12/10/2021 NEGATIVE  NEGATIVE Final   Comment: (NOTE) The Xpert Xpress SARS-CoV-2/FLU/RSV plus assay is intended as an aid in the diagnosis of influenza from Nasopharyngeal swab specimens and should not be used as a sole basis for treatment. Nasal washings and aspirates are unacceptable for Xpert Xpress SARS-CoV-2/FLU/RSV testing.  Fact Sheet for Patients: EntrepreneurPulse.com.au  Fact Sheet for Healthcare Providers: IncredibleEmployment.be  This test is not yet approved or cleared by the [place] FDA and has been authorized for detection and/or diagnosis of SARS-CoV-2 by FDA under an Emergency Use Authorization  (EUA). This EUA will remain in effect (meaning this test can be used) for the duration of the COVID-19 declaration under Section 564(b)(1) of the Act, 21 U.S.C. section 360bbb-3(b)(1), unless the authorization is terminated or revoked.  Performed at [facility], [place] [address] [place], [place] [address]    WBC 12/10/2021 10.8 (H)  4.0 - 10.5 K/uL Final   RBC 12/10/2021 4.46  3.87 - 5.11 MIL/uL Final   Hemoglobin 12/10/2021 11.5 (L)  12.0 - 15.0 g/dL Final   HCT 12/10/2021 35.6 (L)  36.0 - 46.0 % Final   MCV 12/10/2021 79.8 (L)  80.0 - 100.0 fL Final   MCH 12/10/2021 25.8 (L)  26.0 - 34.0 pg Final   MCHC 12/10/2021 32.3  30.0 - 36.0 g/dL Final   RDW 12/10/2021 13.6  11.5 - 15.5 % Final   Platelets 12/10/2021 325  150 - 400 K/uL Final   nRBC 12/10/2021 0.0  0.0 - 0.2 % Final   Neutrophils Relative % 12/10/2021 59  % Final   Neutro Abs 12/10/2021 6.4  1.7 - 7.7 K/uL Final   Lymphocytes Relative 12/10/2021 33  % Final   Lymphs Abs 12/10/2021 3.6  0.7 - 4.0 K/uL Final   Monocytes Relative 12/10/2021 6  % Final   Monocytes Absolute 12/10/2021 0.6  0.1 - 1.0 K/uL Final   Eosinophils Relative 12/10/2021 1  % Final   Eosinophils Absolute 12/10/2021 0.1  0.0 -  0.5 K/uL Final   Basophils Relative 12/10/2021 1  % Final   Basophils Absolute 12/10/2021 0.1  0.0 - 0.1 K/uL Final   Immature Granulocytes 12/10/2021 0  % Final   Abs Immature Granulocytes 12/10/2021 0.04  0.00 - 0.07 K/uL Final   Performed at [facility], [place] [address], [place], [place] [address]   Sodium 12/10/2021 137  135 - 145 mmol/L Final   Potassium 12/10/2021 3.6  3.5 - 5.1 mmol/L Final   Chloride 12/10/2021 108  98 - 111 mmol/L Final   CO2 12/10/2021 21 (L)  22 - 32 mmol/L Final   Glucose, Bld 12/10/2021 95  70 - 99 mg/dL Final   Glucose reference range applies only to samples taken after fasting for at least 8 hours.   BUN 12/10/2021 5 (L)  6 - 20 mg/dL Final   Creatinine, Ser 12/10/2021 0.61  0.44 - 1.00  mg/dL Final   Calcium 12/10/2021 9.2  8.9 - 10.3 mg/dL Final   Total Protein 12/10/2021 6.9  6.5 - 8.1 g/dL Final   Albumin 12/10/2021 3.6  3.5 - 5.0 g/dL Final   AST 12/10/2021 17  15 - 41 U/L Final   ALT 12/10/2021 13  0 - 44 U/L Final   Alkaline Phosphatase 12/10/2021 67  38 - 126 U/L Final   Total Bilirubin 12/10/2021 0.3  0.3 - 1.2 mg/dL Final   GFR, Estimated 12/10/2021 >60  >60 mL/min Final   Comment: (NOTE) Calculated using the CKD-EPI Creatinine Equation (2021)    Anion gap 12/10/2021 8  5 - 15 Final   Performed at [facility] [address], [place], [place] [address]   Cholesterol 12/10/2021 175  0 - 200 mg/dL Final   Triglycerides 12/10/2021 145  <150 mg/dL Final   HDL 12/10/2021 44  >40 mg/dL Final   Total CHOL/HDL Ratio 12/10/2021 4.0  RATIO Final   VLDL 12/10/2021 29  0 - 40 mg/dL Final   LDL Cholesterol 12/10/2021 102 (H)  0 - 99 mg/dL Final   Comment:        Total Cholesterol/HDL:CHD Risk Coronary Heart Disease Risk Table                     Men   Women  1/2 Average Risk   3.4   3.3  Average Risk       5.0   4.4  2 X Average Risk   9.6   7.1  3 X Average Risk  23.4   11.0        Use the calculated Patient Ratio above and the CHD Risk Table to determine the patient's CHD Risk.        ATP III CLASSIFICATION (LDL):  <100     mg/dL   Optimal  100-129  mg/dL   Near or Above                    Optimal  130-159  mg/dL   Borderline  160-189  mg/dL   High  >190     mg/dL   Very High Performed at [facility] [address], [place], [place] [address]    POC Amphetamine UR 12/10/2021 None Detected  NONE DETECTED (Cut Off Level 1000 ng/mL) Final   POC Secobarbital (BAR) 12/10/2021 None Detected  NONE DETECTED (Cut Off Level 300 ng/mL) Final   POC Buprenorphine (BUP) 12/10/2021 None Detected  NONE DETECTED (Cut Off Level 10 ng/mL)  Final   POC Oxazepam (BZO) 12/10/2021 None Detected  NONE DETECTED (Cut Off Level 300 ng/mL) Final   POC Cocaine UR  12/10/2021 None Detected  NONE DETECTED (Cut Off Level 300 ng/mL) Final   POC Methamphetamine UR 12/10/2021 None Detected  NONE DETECTED (Cut Off Level 1000 ng/mL) Final   POC Morphine 12/10/2021 None Detected  NONE DETECTED (Cut Off Level 300 ng/mL) Final   POC Methadone UR 12/10/2021 None Detected  NONE DETECTED (Cut Off Level 300 ng/mL) Final   POC Oxycodone UR 12/10/2021 None Detected  NONE DETECTED (Cut Off Level 100 ng/mL) Final   POC Marijuana UR 12/10/2021 None Detected  NONE DETECTED (Cut Off Level 50 ng/mL) Final   SARSCOV2ONAVIRUS 2 AG 12/10/2021 NEGATIVE  NEGATIVE Final   Comment: (NOTE) SARS-CoV-2 antigen NOT DETECTED.   Negative results are presumptive.  Negative results do not preclude SARS-CoV-2 infection and should not be used as the sole basis for treatment or other patient management decisions, including infection  control decisions, particularly in the presence of clinical signs and  symptoms consistent with COVID-19, or in those who have been in contact with the virus.  Negative results must be combined with clinical observations, patient history, and epidemiological information. The expected result is Negative.  Fact Sheet for Patients: HandmadeRecipes.com.cy  Fact Sheet for Healthcare Providers: FuneralLife.at  This test is not yet approved or cleared by the [place] FDA and  has been authorized for detection and/or diagnosis of SARS-CoV-2 by FDA under an Emergency Use Authorization (EUA).  This EUA will remain in effect (meaning this test can be used) for the duration of  the COV                          ID-19 declaration under Section 564(b)(1) of the Act, 21 U.S.C. section 360bbb-3(b)(1), unless the authorization is terminated or revoked sooner.     hCG, Beta Chain, Quant, S 12/10/2021 <1  <5 mIU/mL Final   Comment:          GEST. AGE      CONC.  (mIU/mL)   <=1 WEEK        5 - 50     2 WEEKS       50 -  500     3 WEEKS       100 - 10,000     4 WEEKS     1,000 - 30,000     5 WEEKS     3,500 - 115,000   6-8 WEEKS     12,000 - 270,000    12 WEEKS     15,000 - 220,000        FEMALE AND NON-PREGNANT FEMALE:     LESS THAN 5 mIU/mL Performed at [facility], [place] [address] [place], [place] [address]    TSH 12/10/2021 1.245  0.350 - 4.500 uIU/mL Final   Comment: Performed by a 3rd Generation assay with a functional sensitivity of <=0.01 uIU/mL. Performed at [facility], [place] [address], [place], [place] [address]   Admission on 11/29/2021, Discharged on 11/29/2021  Component Date Value Ref Range Status   WBC 11/29/2021 10.3  4.0 - 10.5 K/uL Final   RBC 11/29/2021 4.55  3.87 - 5.11 MIL/uL Final   Hemoglobin 11/29/2021 12.0  12.0 - 15.0 g/dL Final   HCT 11/29/2021 37.7  36.0 - 46.0 % Final   MCV 11/29/2021 82.9  80.0 - 100.0 fL  Final   MCH 11/29/2021 26.4  26.0 - 34.0 pg Final   MCHC 11/29/2021 31.8  30.0 - 36.0 g/dL Final   RDW 11/29/2021 13.8  11.5 - 15.5 % Final   Platelets 11/29/2021 276  150 - 400 K/uL Final   nRBC 11/29/2021 0.0  0.0 - 0.2 % Final   Neutrophils Relative % 11/29/2021 58  % Final   Neutro Abs 11/29/2021 6.0  1.7 - 7.7 K/uL Final   Lymphocytes Relative 11/29/2021 35  % Final   Lymphs Abs 11/29/2021 3.6  0.7 - 4.0 K/uL Final   Monocytes Relative 11/29/2021 5  % Final   Monocytes Absolute 11/29/2021 0.5  0.1 - 1.0 K/uL Final   Eosinophils Relative 11/29/2021 1  % Final   Eosinophils Absolute 11/29/2021 0.1  0.0 - 0.5 K/uL Final   Basophils Relative 11/29/2021 1  % Final   Basophils Absolute 11/29/2021 0.1  0.0 - 0.1 K/uL Final   Immature Granulocytes 11/29/2021 0  % Final   Abs Immature Granulocytes 11/29/2021 0.03  0.00 - 0.07 K/uL Final   Performed at [facility], [place] [address] [place], [place] [address]   Sodium 11/29/2021 140  135 - 145 mmol/L Final   Potassium 11/29/2021 3.9  3.5 - 5.1 mmol/L Final   Chloride 11/29/2021 106  98 - 111 mmol/L  Final   CO2 11/29/2021 23  22 - 32 mmol/L Final   Glucose, Bld 11/29/2021 101 (H)  70 - 99 mg/dL Final   Glucose reference range applies only to samples taken after fasting for at least 8 hours.   BUN 11/29/2021 6  6 - 20 mg/dL Final   Creatinine, Ser 11/29/2021 0.64  0.44 - 1.00 mg/dL Final   Calcium 11/29/2021 9.2  8.9 - 10.3 mg/dL Final   GFR, Estimated 11/29/2021 >60  >60 mL/min Final   Comment: (NOTE) Calculated using the CKD-EPI Creatinine Equation (2021)    Anion gap 11/29/2021 11  5 - 15 Final   Performed at [facility], [place] [address] [place], [place] [address]   Troponin I (High Sensitivity) 11/29/2021 3  <18 ng/L Final   Comment: (NOTE) Elevated high sensitivity troponin I (hsTnI) values and significant  changes across serial measurements may suggest ACS but many other  chronic and acute conditions are known to elevate hsTnI results.  Refer to the "Links" section for chest pain algorithms and additional  guidance. Performed at [facility], [place] [address], [place], [place] [address]    I-stat hCG, quantitative 11/29/2021 <5.0  <5 mIU/mL Final   Comment 3 11/29/2021          Final   Comment:   GEST. AGE      CONC.  (mIU/mL)   <=1 WEEK        5 - 50     2 WEEKS       50 - 500     3 WEEKS       100 - 10,000     4 WEEKS     1,000 - 30,000        FEMALE AND NON-PREGNANT FEMALE:     LESS THAN 5 mIU/mL   Admission on 11/01/2021, Discharged on 11/03/2021  Component Date Value Ref Range Status   SARS Coronavirus 2 by RT PCR 11/01/2021 NEGATIVE  NEGATIVE Final   Comment: (NOTE) SARS-CoV-2 target nucleic acids are NOT DETECTED.  The SARS-CoV-2 RNA is generally detectable in upper respiratory specimens during the  acute phase of infection. The lowest concentration of SARS-CoV-2 viral copies this assay can detect is 138 copies/mL. A negative result does not preclude SARS-Cov-2 infection and should not be used as the sole basis for treatment or other patient  management decisions. A negative result may occur with  improper specimen collection/handling, submission of specimen other than nasopharyngeal swab, presence of viral mutation(s) within the areas targeted by this assay, and inadequate number of viral copies(<138 copies/mL). A negative result must be combined with clinical observations, patient history, and epidemiological information. The expected result is Negative.  Fact Sheet for Patients:  EntrepreneurPulse.com.au  Fact Sheet for Healthcare Providers:  IncredibleEmployment.be  This test is no                          t yet approved or cleared by the [place] FDA and  has been authorized for detection and/or diagnosis of SARS-CoV-2 by FDA under an Emergency Use Authorization (EUA). This EUA will remain  in effect (meaning this test can be used) for the duration of the COVID-19 declaration under Section 564(b)(1) of the Act, 21 U.S.C.section 360bbb-3(b)(1), unless the authorization is terminated  or revoked sooner.       Influenza A by PCR 11/01/2021 NEGATIVE  NEGATIVE Final   Influenza B by PCR 11/01/2021 NEGATIVE  NEGATIVE Final   Comment: (NOTE) The Xpert Xpress SARS-CoV-2/FLU/RSV plus assay is intended as an aid in the diagnosis of influenza from Nasopharyngeal swab specimens and should not be used as a sole basis for treatment. Nasal washings and aspirates are unacceptable for Xpert Xpress SARS-CoV-2/FLU/RSV testing.  Fact Sheet for Patients: EntrepreneurPulse.com.au  Fact Sheet for Healthcare Providers: IncredibleEmployment.be  This test is not yet approved or cleared by the [place] FDA and has been authorized for detection and/or diagnosis of SARS-CoV-2 by FDA under an Emergency Use Authorization (EUA). This EUA will remain in effect (meaning this test can be used) for the duration of the COVID-19 declaration under Section 564(b)(1)  of the Act, 21 U.S.C. section 360bbb-3(b)(1), unless the authorization is terminated or revoked.  Performed at [facility], [place] [address] [place],  [address]    Preg Test, Ur 11/01/2021 NEGATIVE  NEGATIVE Final   Comment:        THE SENSITIVITY OF THIS METHODOLOGY IS >24 mIU/mL   Admission on 10/28/2021, Discharged on 10/30/2021  Component Date Value Ref Range Status   Sodium 10/29/2021 138  135 - 145 mmol/L Final   Potassium 10/29/2021 3.8  3.5 - 5.1 mmol/L Final   Chloride 10/29/2021 107  98 - 111 mmol/L Final   CO2 10/29/2021 23  22 - 32 mmol/L Final   Glucose, Bld 10/29/2021 112 (H)  70 - 99 mg/dL Final   Glucose reference range applies only to samples taken after fasting for at least 8 hours.   BUN 10/29/2021 6  6 - 20 mg/dL Final   Creatinine, Ser 10/29/2021 0.70  0.44 - 1.00 mg/dL Final   Calcium 10/29/2021 9.1  8.9 - 10.3 mg/dL Final   Total Protein 10/29/2021 6.4 (L)  6.5 - 8.1 g/dL Final   Albumin 10/29/2021 3.6  3.5 - 5.0 g/dL Final   AST 10/29/2021 17  15 - 41 U/L Final   ALT 10/29/2021 14  0 - 44 U/L Final   Alkaline Phosphatase 10/29/2021 76  38 - 126 U/L Final   Total Bilirubin 10/29/2021 0.3  0.3 - 1.2 mg/dL Final  GFR, Estimated 10/29/2021 >60  >60 mL/min Final   Comment: (NOTE) Calculated using the CKD-EPI Creatinine Equation (2021)    Anion gap 10/29/2021 8  5 - 15 Final   Performed at [facility], [place] [address], [place], [place] [address]   Alcohol, Ethyl (B) 10/29/2021 <10  <10 mg/dL Final   Comment: (NOTE) Lowest detectable limit for serum alcohol is 10 mg/dL.  For medical purposes only. Performed at [facility], [place] [address], [place], [place] [address]    Salicylate Lvl 14/48/1856 <7.0 (L)  7.0 - 30.0 mg/dL Final   Performed at [facility] [address] [place], [place] [address]   Acetaminophen (Tylenol), Serum 10/29/2021 <10 (L)  10 - 30 ug/mL Final   Comment: (NOTE) Therapeutic concentrations vary  significantly. A range of 10-30 ug/mL  may be an effective concentration for many patients. However, some  are best treated at concentrations outside of this range. Acetaminophen concentrations >150 ug/mL at 4 hours after ingestion  and >50 ug/mL at 12 hours after ingestion are often associated with  toxic reactions.  Performed at [facility], [place] [address], [place], [place] [address]    WBC 10/29/2021 9.5  4.0 - 10.5 K/uL Final   RBC 10/29/2021 4.49  3.87 - 5.11 MIL/uL Final   Hemoglobin 10/29/2021 11.8 (L)  12.0 - 15.0 g/dL Final   HCT 10/29/2021 37.0  36.0 - 46.0 % Final   MCV 10/29/2021 82.4  80.0 - 100.0 fL Final   MCH 10/29/2021 26.3  26.0 - 34.0 pg Final   MCHC 10/29/2021 31.9  30.0 - 36.0 g/dL Final   RDW 10/29/2021 13.7  11.5 - 15.5 % Final   Platelets 10/29/2021 292  150 - 400 K/uL Final   nRBC 10/29/2021 0.0  0.0 - 0.2 % Final   Performed at [facility] [address] [place], [place] [address]   Opiates 10/29/2021 NONE DETECTED  NONE DETECTED Final   Cocaine 10/29/2021 NONE DETECTED  NONE DETECTED Final   Benzodiazepines 10/29/2021 NONE DETECTED  NONE DETECTED Final   Amphetamines 10/29/2021 NONE DETECTED  NONE DETECTED Final   Tetrahydrocannabinol 10/29/2021 NONE DETECTED  NONE DETECTED Final   Barbiturates 10/29/2021 NONE DETECTED  NONE DETECTED Final   Comment: (NOTE) DRUG SCREEN FOR MEDICAL PURPOSES ONLY.  IF CONFIRMATION IS NEEDED FOR ANY PURPOSE, NOTIFY LAB WITHIN 5 DAYS.  LOWEST DETECTABLE LIMITS FOR URINE DRUG SCREEN Drug Class                     Cutoff (ng/mL) Amphetamine and metabolites    1000 Barbiturate and metabolites    200 Benzodiazepine                 027 Tricyclics and metabolites     300 Opiates and metabolites        300 Cocaine and metabolites        300 THC                            50 Performed at [facility], [place] [address] [place], [place] [address]    I-stat hCG, quantitative 10/29/2021 <5.0  <5 mIU/mL  Final   Comment 3 10/29/2021          Final   Comment:   GEST. AGE      CONC.  (mIU/mL)   <=1 WEEK  5 - 50     2 WEEKS       50 - 500     3 WEEKS       100 - 10,000     4 WEEKS     1,000 - 30,000        FEMALE AND NON-PREGNANT FEMALE:     LESS THAN 5 mIU/mL    Lactic Acid, Venous 10/29/2021 1.8  0.5 - 1.9 mmol/L Final   Performed at [facility], [place] [address], [place], [place] [address]   SARS Coronavirus 2 by RT PCR 10/29/2021 NEGATIVE  NEGATIVE Final   Comment: (NOTE) SARS-CoV-2 target nucleic acids are NOT DETECTED.  The SARS-CoV-2 RNA is generally detectable in upper respiratory specimens during the acute phase of infection. The lowest concentration of SARS-CoV-2 viral copies this assay can detect is 138 copies/mL. A negative result does not preclude SARS-Cov-2 infection and should not be used as the sole basis for treatment or other patient management decisions. A negative result may occur with  improper specimen collection/handling, submission of specimen other than nasopharyngeal swab, presence of viral mutation(s) within the areas targeted by this assay, and inadequate number of viral copies(<138 copies/mL). A negative result must be combined with clinical observations, patient history, and epidemiological information. The expected result is Negative.  Fact Sheet for Patients:  EntrepreneurPulse.com.au  Fact Sheet for Healthcare Providers:  IncredibleEmployment.be  This test is no                          t yet approved or cleared by the [place] FDA and  has been authorized for detection and/or diagnosis of SARS-CoV-2 by FDA under an Emergency Use Authorization (EUA). This EUA will remain  in effect (meaning this test can be used) for the duration of the COVID-19 declaration under Section 564(b)(1) of the Act, 21 U.S.C.section 360bbb-3(b)(1), unless the authorization is terminated  or revoked sooner.        Influenza A by PCR 10/29/2021 NEGATIVE  NEGATIVE Final   Influenza B by PCR 10/29/2021 NEGATIVE  NEGATIVE Final   Comment: (NOTE) The Xpert Xpress SARS-CoV-2/FLU/RSV plus assay is intended as an aid in the diagnosis of influenza from Nasopharyngeal swab specimens and should not be used as a sole basis for treatment. Nasal washings and aspirates are unacceptable for Xpert Xpress SARS-CoV-2/FLU/RSV testing.  Fact Sheet for Patients: EntrepreneurPulse.com.au  Fact Sheet for Healthcare Providers: IncredibleEmployment.be  This test is not yet approved or cleared by the [place] FDA and has been authorized for detection and/or diagnosis of SARS-CoV-2 by FDA under an Emergency Use Authorization (EUA). This EUA will remain in effect (meaning this test can be used) for the duration of the COVID-19 declaration under Section 564(b)(1) of the Act, 21 U.S.C. section 360bbb-3(b)(1), unless the authorization is terminated or revoked.  Performed at [facility], [place] [address], [place], [place] [address]   There may be more visits with results that are not included.    Allergies: Bee venom, Coconut flavor, Fish allergy, Geodon [ziprasidone hcl], Haloperidol and related, Lithobid [lithium], Roxicodone [oxycodone], Seroquel [quetiapine], Shellfish allergy, Phenergan [promethazine hcl], Prilosec [omeprazole], Sulfa antibiotics, Tegretol [carbamazepine], Prozac [fluoxetine], Tape, and Tylenol [acetaminophen]  PTA Medications:  No current facility-administered medications on file prior to encounter.   Current Outpatient Medications on File Prior to Encounter  Medication Sig Dispense Refill   albuterol (VENTOLIN HFA) 108 (90 Base) MCG/ACT inhaler Inhale 2 puffs  into the lungs every 6 (six) hours as needed for wheezing or shortness of breath.     budesonide-formoterol (SYMBICORT) 160-4.5 MCG/ACT inhaler Inhale 2 puffs into the lungs 2 (two) times daily.      cariprazine (VRAYLAR) 3 MG capsule Take 1 capsule (3 mg total) by mouth daily for 14 days. 14 capsule 0   escitalopram (LEXAPRO) 10 MG tablet Take 1 tablet (10 mg total) by mouth daily for 14 days. 14 tablet 0   hydrOXYzine (ATARAX) 25 MG tablet Take 1 tablet (25 mg total) by mouth daily as needed for up to 14 days for anxiety. 14 tablet 0   melatonin 3 MG TABS tablet Take 0.5 tablets (1.5 mg total) by mouth at bedtime for 14 days. 7 tablet 0   metoprolol succinate (TOPROL-XL) 25 MG 24 hr tablet Take 0.5 tablets (12.5 mg total) by mouth daily for 14 days. 7 tablet 0   pantoprazole (PROTONIX) 40 MG tablet Take 1 tablet (40 mg total) by mouth daily for 14 days. 14 tablet 0   prazosin (MINIPRESS) 1 MG capsule Take 1 capsule (1 mg total) by mouth at bedtime for 14 days. 14 capsule 0     Medical Decision Making  :Lyliana Dicenso. Sylvia is a 32 y/o female with a history of schizoaffective disorder bipolar type, anxiety autism spectrum disorder, PTSD, borderline personality disorder Patient states that if she does not get lyrica she will slash her throat or stab herself in the heart.    Recommendations  Based on my evaluation the patient does not appear to have an emergency medical condition.  Patient will be admitted to [facility] for crisis management, stabilization and safety.  Lucia Bitter, NP 02/10/22  6:51 AM

## 2022-02-11 DIAGNOSIS — F250 Schizoaffective disorder, bipolar type: Secondary | ICD-10-CM | POA: Diagnosis not present

## 2022-02-11 NOTE — ED Notes (Signed)
Patient states she is "no longer suicidal" and wants to go home - provider made aware

## 2022-02-11 NOTE — ED Provider Notes (Addendum)
FBC/OBS ASAP Discharge Summary  Date and Time: 02/11/2022 10:08 AM  Name: [name]  MRN:  [id number]   Discharge Diagnoses:  Final diagnoses:  Schizoaffective disorder, bipolar type ([place])  Suicidal ideation    Subjective: [name] 32 year old Caucasian female well-known to this service.  Has a charted history with borderline personality disorder, schizoaffective disorder bipolar type, posttraumatic stress disorder, suicidal behaviors with multiple attempts.  Currently followed by [facility] for additional community resources.  Patient is requesting a discharge states feeling better today.  NP spoke Butch Penny @ [phone] with Beverly Sessions, patient's CST treatment team for additional safety planning.  Butch Penny stated that patient is able to follow-up on 02/12/2022 Monday. However,does confirm that patient has a follow-up appointment this Tuesday on 02/13/2022. Butch Penny reported patient's medication are just needed weekly and she denied safety concerns noted with her discharging.  Case staffed with attending psychiatrist MD Akintayo.  Patient encouraged to keep all outpatient follow-up appointments.  During evaluation [name] is siting in no acute distress. She is alert/oriented x 4; calm/cooperative; and mood congruent with affect. She is speaking in a clear tone at moderate volume, and normal pace; with good eye contact.Her thought process is coherent and relevant; There is no indication that she is currently responding to internal/external stimuli or experiencing delusional thought content; and she has denied suicidal/self-harm/homicidal ideation, psychosis, and paranoia.  Patient has remained calm throughout assessment and has answered questions appropriately.    At this time [name] is educated and verbalizes understanding of mental health resources and other crisis services in the community. She is instructed to call 911 and present to the nearest emergency room  should she experience any suicidal/homicidal ideation, auditory/visual/hallucinations, or detrimental worsening of her mental health condition. She was a also advised by Probation officer that she could call the toll-free phone on back of  insurance card to assist with identifying in network counselors and agencies or number on back of Medicaid card to speak with care coordinator.  Per initial admission assessment:" [name]. [name] is a 32 y/o female with a history of schizoaffective disorder bipolar type, anxiety autism spectrum disorder, PTSD, borderline personality disorder presenting voluntarily to [facility] unaccompanied for suicidal ideation because she does not have a prescription for her Lyrica.  Patient states that she is not able to sleep and that her fibromyalgia is uncontrolled and she is having migraines because she is not have her Lyrica. 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 fragile X syndrome    Chronic constipation    Depression    Drug-seeking behavior    Essential tremor    Headache    Overdose of acetaminophen 07/2017   and other meds   Personality disorder ([place])    Schizo-affective psychosis ([place])    Schizoaffective disorder, bipolar type ([place])    Seizures ([place])    Last seizure December 2017   Sleep apnea     Past Surgical History:  Procedure Laterality Date   MOUTH SURGERY  2009 or 2010   Family History:  Family History  Problem Relation Age of Onset  Mental illness Father    Asthma Father    PDD Brother    Seizures Brother    Family Psychiatric History:  Social History:  Social History   Substance and Sexual Activity  Alcohol Use No   Alcohol/week: 1.0 standard  drink of alcohol   Types: 1 Standard drinks or equivalent per week   Comment: denies at this time     Social History   Substance and Sexual Activity  Drug Use No   Comment: History of cocaine use at age 72 for 4 months    Social History   Socioeconomic History   Marital status: Widowed    Spouse name: Not on file   Number of children: 0   Years of education: Not on file   Highest education level: Not on file  Occupational History   Occupation: disability  Tobacco Use   Smoking status: Former    Packs/day: 0.00    Types: Cigarettes   Smokeless tobacco: Never   Tobacco comments:    Smoked for 2  years age [age]  Vaping Use   Vaping Use: Never used  Substance and Sexual Activity   Alcohol use: No    Alcohol/week: 1.0 standard drink of alcohol    Types: 1 Standard drinks or equivalent per week    Comment: denies at this time   Drug use: No    Comment: History of cocaine use at age 48 for 4 months   Sexual activity: Not Currently    Birth control/protection: None  Other Topics Concern   Not on file  Social History Narrative   Marital status: Widowed      Children: daughter      Lives: with boyfriend, in two story home      Employment:  Disability      Tobacco: quit smoking; smoked for two years.      Alcohol ;none      Drugs: none   Has not traveled outside of the country.   Right handed         Social Determinants of Health   Financial Resource Strain: Not on file  Food Insecurity: No Food Insecurity (01/31/2022)   Hunger Vital Sign    Worried About Running Out of Food in the Last Year: Never true    Ran Out of Food in the Last Year: Never true  Recent Concern: Food Insecurity - Food Insecurity Present (12/28/2021)   Hunger Vital Sign    Worried About Running Out of Food in the Last Year: Often true    Ran Out of Food in the Last Year: Often true  Transportation Needs: Unmet Transportation Needs (01/31/2022)   PRAPARE - Radiographer, therapeutic (Medical): No    Lack of Transportation (Non-Medical): Yes  Physical Activity: Not on file  Stress: Not on file  Social Connections: Not on file   SDOH:  [place]: No Food Insecurity (01/31/2022)  Recent Concern: [place] Present (12/28/2021)  Housing: Low Risk  (01/31/2022)  Recent Concern: Housing - [place] (01/01/2022)  Transportation Needs: Unmet Transportation Needs (01/31/2022)  Utilities: Not At Risk (01/31/2022)  Recent Concern: Utilities - At Risk (12/28/2021)  Alcohol Screen: Low Risk  (01/31/2022)  Depression (PHQ2-9): High Risk (02/10/2022)  Tobacco Use: Medium Risk (01/31/2022)    Tobacco Cessation:  N/A, patient does not currently use tobacco products  Current Medications:  Current Facility-Administered Medications  Medication Dose Route Frequency Provider Last  Rate Last Admin   albuterol (VENTOLIN HFA) 108 (90 Base) MCG/ACT inhaler 2 puff  2 puff Inhalation Q6H PRN Derrill Center, NP       alum & mag hydroxide-simeth (MAALOX/MYLANTA) 200-200-20 MG/5ML suspension 30 mL  30 mL Oral Q4H PRN Bobbitt, Shalon E, NP       cariprazine (VRAYLAR) capsule 3 mg  3 mg Oral Daily Derrill Center, NP   3 mg at 02/11/22 0915   escitalopram (LEXAPRO) tablet 10 mg  10 mg Oral Daily Derrill Center, NP   10 mg at 02/11/22 0915   hydrOXYzine (ATARAX) tablet 25 mg  25 mg Oral Daily PRN Derrill Center, NP   25 mg at 02/10/22 2213   hydrOXYzine (ATARAX) tablet 50 mg  50 mg Oral TID PRN Bobbitt, Shalon E, NP   50 mg at 02/10/22 0935   magnesium hydroxide (MILK OF MAGNESIA) suspension 30 mL  30 mL Oral Daily PRN Bobbitt, Shalon E, NP       melatonin tablet 1.5 mg  1.5 mg Oral QHS Bobbitt, Shalon E, NP   1.5 mg at 02/10/22 2213   metoprolol succinate (TOPROL-XL) 24 hr tablet 12.5 mg  12.5 mg Oral Daily Derrill Center, NP   12.5 mg at 02/11/22 0915   mometasone-formoterol (DULERA) 200-5 MCG/ACT inhaler 2 puff  2 puff  Inhalation BID Derrill Center, NP   2 puff at 02/11/22 0900   pantoprazole (PROTONIX) EC tablet 40 mg  40 mg Oral Daily Derrill Center, NP   40 mg at 02/11/22 0915   prazosin (MINIPRESS) capsule 1 mg  1 mg Oral QHS [place], Ene A, NP   1 mg at 02/10/22 0160   Current Outpatient Medications  Medication Sig Dispense Refill   albuterol (VENTOLIN HFA) 108 (90 Base) MCG/ACT inhaler Inhale 2 puffs into the lungs every 6 (six) hours as needed for wheezing or shortness of breath.     budesonide-formoterol (SYMBICORT) 160-4.5 MCG/ACT inhaler Inhale 2 puffs into the lungs 2 (two) times daily.     cariprazine (VRAYLAR) 3 MG capsule Take 1 capsule (3 mg total) by mouth daily for 14 days. 14 capsule 0   escitalopram (LEXAPRO) 10 MG tablet Take 1 tablet (10 mg total) by mouth daily for 14 days. 14 tablet 0   hydrOXYzine (ATARAX) 25 MG tablet Take 1 tablet (25 mg total) by mouth daily as needed for up to 14 days for anxiety. 14 tablet 0   melatonin 3 MG TABS tablet Take 0.5 tablets (1.5 mg total) by mouth at bedtime for 14 days. 7 tablet 0   metoprolol succinate (TOPROL-XL) 25 MG 24 hr tablet Take 0.5 tablets (12.5 mg total) by mouth daily for 14 days. 7 tablet 0   pantoprazole (PROTONIX) 40 MG tablet Take 1 tablet (40 mg total) by mouth daily for 14 days. 14 tablet 0   prazosin (MINIPRESS) 1 MG capsule Take 1 capsule (1 mg total) by mouth at bedtime for 14 days. 14 capsule 0    PTA Medications: (Not in a hospital admission)      02/10/2022    1:49 AM 06/27/2021    3:18 AM 05/01/2021    2:16 AM  Depression screen PHQ 2/9  Decreased Interest '2 1 2  '$ Down, Depressed, Hopeless '2 2 3  '$ PHQ - 2 Score '4 3 5  '$ Altered sleeping 2 0 0  Tired, decreased energy '2 1 1  '$ Change in appetite 2 0 1  Feeling bad  or failure about yourself  '2 1 3  '$ Trouble concentrating '2 2 3  '$ Moving slowly or fidgety/restless '1 1 1  '$ Suicidal thoughts '1 1 3  '$ PHQ-9 Score '16 9 17  '$ Difficult doing work/chores Very difficult Somewhat  difficult Extremely dIfficult    Flowsheet Row ED from 02/10/2022 in [facility] Admission (Discharged) from 01/31/2022 in [facility] ED from 01/30/2022 in [facility] CATEGORY High Risk Low Risk High Risk       Musculoskeletal  Strength & Muscle Tone: within normal limits Gait & Station: normal Patient leans: N/A  Psychiatric Specialty Exam  Presentation  General Appearance:  Casual  Eye Contact: Good  Speech: Pressured  Speech Volume: Increased  Handedness: Right   Mood and Affect  Mood: Anxious; Depressed; Irritable  Affect: Blunt; Flat   Thought Process  Thought Processes: Coherent; Goal Directed  Descriptions of Associations:Intact  Orientation:Full (Time, Place and Person)  Thought Content:WDL  Diagnosis of Schizophrenia or Schizoaffective disorder in past: Yes  Duration of Psychotic Symptoms: Greater than six months   Hallucinations:Hallucinations: None Description of Auditory Hallucinations: None  Ideas of Reference:None  Suicidal Thoughts:Suicidal Thoughts: Yes, Active (Thoughts of cutting her throat) SI Active Intent and/or Plan: With Intent; With Plan SI Passive Intent and/or Plan: With Intent; With Plan  Homicidal Thoughts:Homicidal Thoughts: No   Sensorium  Memory: Immediate Good; Recent Good; Remote Good  Judgment: Poor  Insight: Fair   Materials engineer: Fair  Attention Span: Fair  Recall: [place] of Knowledge: Fair  Language: Good   Psychomotor Activity  Psychomotor Activity: Psychomotor Activity: Normal   Assets  Assets: Communication Skills; Financial Resources/Insurance; Housing; Resilience; Physical Health   Sleep  Sleep: Sleep: Poor Number of Hours of Sleep: -1   Nutritional Assessment (For OBS and FBC admissions only) Has the patient had a weight loss or gain of 10  pounds or more in the last 3 months?: No Has the patient had a decrease in food intake/or appetite?: No Does the patient have dental problems?: No Does the patient have eating habits or behaviors that may be indicators of an eating disorder including binging or inducing vomiting?: No Has the patient recently lost weight without trying?: 0 Has the patient been eating poorly because of a decreased appetite?: 0 Malnutrition Screening Tool Score: 0    Physical Exam  Physical Exam Vitals and nursing note reviewed.  Constitutional:      Appearance: Normal appearance.  Neurological:     Mental Status: She is alert.  Psychiatric:        Mood and Affect: Mood normal.        Thought Content: Thought content normal.    Review of Systems  Cardiovascular: Negative.   Gastrointestinal: Negative.   Psychiatric/Behavioral:  Negative for depression, substance abuse and suicidal ideas. The patient is nervous/anxious.   All other systems reviewed and are negative.  Blood pressure 112/71, pulse 86, temperature 98.4 F (36.9 C), temperature source Oral, resp. rate 18, SpO2 100 %. There is no height or weight on file to calculate BMI.  Demographic Factors:  Caucasian  Loss Factors: NA  Historical Factors: Prior suicide attempts and Impulsivity  Risk Reduction Factors:   Positive therapeutic relationship and Positive coping skills or problem solving skills  Continued Clinical Symptoms:  Severe Anxiety and/or Agitation  Cognitive Features That Contribute To Risk:  Closed-mindedness    Suicide Risk:  Minimal: No  identifiable suicidal ideation.  Patients presenting with no risk factors but with morbid ruminations; may be classified as minimal risk based on the severity of the depressive symptoms  Plan Of Care/Follow-up recommendations:  Activity:  as tolerated Diet:  heart healthy   Disposition: Take all of you medications as prescribed by your mental healthcare provider.  Report any  adverse effects and reactions from your medications to your outpatient provider promptly.  Do not engage in alcohol and or illegal drug use while on prescription medicines. Keep all scheduled appointments. This is to ensure that you are getting refills on time and to avoid any interruption in your medication.  If you are unable to keep an appointment call to reschedule.  Be sure to follow up with resources and follow ups given. In the event of worsening symptoms call the crisis hotline, 911, and or go to the nearest emergency department for appropriate evaluation and treatment of symptoms. Follow-up with your primary care provider for your medical issues, concerns and or health care needs.    Derrill Center, NP 02/11/2022, 10:08 AM

## 2022-02-11 NOTE — ED Notes (Signed)
Patient A&Ox4. Patient denies SI/HI and AVH. Patient denies any physical complaints when asked. Patient states she is ready to be discharged. No acute distress noted. Support and encouragement provided. Routine safety checks conducted according to facility protocol. Encouraged patient to notify staff if thoughts of harm toward self or others arise. Patient verbalize understanding and agreement. Will continue to monitor for safety.

## 2022-02-11 NOTE — ED Notes (Signed)
Pt sleeping in recliner bed. Resp even and unlabored. Per facility protocol will continue to monitor for safety

## 2022-02-11 NOTE — ED Notes (Signed)
Patient pacing unit stating she wants to go home. Provider Ricky Ala, NP informed of patient request.

## 2022-02-11 NOTE — Discharge Instructions (Signed)
Take all of you medications as prescribed by your mental healthcare provider. Report any adverse effects and reactions from your medications to your outpatient provider promptly.  Do not engage in alcohol and or illegal drug use while on prescription medicines.  Keep all scheduled appointments. This is to ensure that you are getting refills on time and to avoid any interruption in your medication.  If you are unable to keep an appointment call to reschedule.  Be sure to follow up with resources and follow ups given.  In the event of worsening symptoms call the crisis hotline, 911, and or go to the nearest emergency department for appropriate evaluation and treatment of symptoms.  Follow-up with your primary care provider for your medical issues, concerns and or health care needs.    

## 2022-02-11 NOTE — ED Notes (Signed)
Patient A&O x 4, ambulatory. Patient discharged in no acute distress. Patient denied SI/HI, A/VH upon discharge. Patient verbalized understanding of all discharge instructions explained by staff, to include follow up appointments, RX's and safety plan. Patient reported mood 10/10.  Pt belongings returned to patient from locker # 25 intact. Patient escorted to lobby via staff for transport to destination via taxi using taxi voucher. Safety maintained.

## 2022-02-11 NOTE — ED Notes (Signed)
Pt sleeping in recliner bed. Resp even and unlabored. No noted resp distress. Will continue to monitor for safety

## 2022-02-15 ENCOUNTER — Emergency Department (HOSPITAL_COMMUNITY)
Admission: EM | Admit: 2022-02-15 | Discharge: 2022-02-15 | Discharge status: Against Medical Advice | Payer: PPO | Attending: Emergency Medicine | Admitting: Emergency Medicine

## 2022-02-15 ENCOUNTER — Other Ambulatory Visit: Payer: Self-pay

## 2022-02-15 DIAGNOSIS — R42 Dizziness and giddiness: Secondary | ICD-10-CM | POA: Diagnosis present

## 2022-02-15 DIAGNOSIS — Z5321 Procedure and treatment not carried out due to patient leaving prior to being seen by health care provider: Secondary | ICD-10-CM | POA: Diagnosis not present

## 2022-02-15 LAB — I-STAT BETA HCG BLOOD, ED (MC, WL, AP ONLY): I-stat hCG, quantitative: <5 m[IU]/mL (ref <5)

## 2022-02-15 LAB — CBC
HCT: 43 % (ref 36.0–46.0)
Hemoglobin: 13.3 g/dL (ref 12.0–15.0)
MCH: 25.4 pg — ABNORMAL LOW (ref 26.0–34.0)
MCHC: 30.9 g/dL (ref 30.0–36.0)
MCV: 82.1 fL (ref 80.0–100.0)
Platelets: 329 10*3/uL (ref 150–400)
RBC: 5.24 MIL/uL — ABNORMAL HIGH (ref 3.87–5.11)
RDW: 13.9 % (ref 11.5–15.5)
WBC: 10.2 10*3/uL (ref 4.0–10.5)
nRBC: 0 % (ref 0.0–0.2)

## 2022-02-15 LAB — URINALYSIS, ROUTINE W REFLEX MICROSCOPIC
Bilirubin Urine: NEGATIVE
Glucose, UA: NEGATIVE mg/dL
Hgb urine dipstick: NEGATIVE
Ketones, ur: NEGATIVE mg/dL
Leukocytes,Ua: NEGATIVE
Nitrite: NEGATIVE
Protein, ur: NEGATIVE mg/dL
Specific Gravity, Urine: 1.009 (ref 1.005–1.030)
pH: 6 (ref 5.0–8.0)

## 2022-02-15 LAB — BASIC METABOLIC PANEL
Anion gap: 11 (ref 5–15)
BUN: 7 mg/dL (ref 6–20)
CO2: 22 mmol/L (ref 22–32)
Calcium: 9.1 mg/dL (ref 8.9–10.3)
Chloride: 103 mmol/L (ref 98–111)
Creatinine, Ser: 0.66 mg/dL (ref 0.44–1.00)
GFR, Estimated: >60 mL/min (ref >60)
Glucose, Bld: 107 mg/dL — ABNORMAL HIGH (ref 70–99)
Potassium: 4.3 mmol/L (ref 3.5–5.1)
Sodium: 136 mmol/L (ref 135–145)

## 2022-02-15 NOTE — ED Notes (Signed)
Pt. Left stated that she didn't want to wait any longer

## 2022-02-15 NOTE — ED Triage Notes (Signed)
EMS stated, [name] been dizziness for 2 days and they increased her Lyrica and she thinks that's what causing the dizziness.

## 2022-02-21 ENCOUNTER — Ambulatory Visit (HOSPITAL_COMMUNITY)
Admission: EM | Admit: 2022-02-21 | Discharge: 2022-02-22 | Disposition: A | Discharge status: Home / Self Care | Payer: PPO | Attending: Psychiatry | Admitting: Psychiatry

## 2022-02-21 DIAGNOSIS — Z1152 Encounter for screening for COVID-19: Secondary | ICD-10-CM | POA: Diagnosis not present

## 2022-02-21 DIAGNOSIS — F339 Major depressive disorder, recurrent, unspecified: Secondary | ICD-10-CM | POA: Insufficient documentation

## 2022-02-21 DIAGNOSIS — Z9151 Personal history of suicidal behavior: Secondary | ICD-10-CM | POA: Diagnosis not present

## 2022-02-21 DIAGNOSIS — R45851 Suicidal ideations: Secondary | ICD-10-CM | POA: Diagnosis present

## 2022-02-21 DIAGNOSIS — Z765 Malingerer [conscious simulation]: Secondary | ICD-10-CM | POA: Diagnosis not present

## 2022-02-21 DIAGNOSIS — F4320 Adjustment disorder, unspecified: Secondary | ICD-10-CM | POA: Insufficient documentation

## 2022-02-21 DIAGNOSIS — F603 Borderline personality disorder: Secondary | ICD-10-CM

## 2022-02-21 LAB — POC SARS CORONAVIRUS 2 AG: SARSCOV2ONAVIRUS 2 AG: NEGATIVE

## 2022-02-21 MED ORDER — ALUM & MAG HYDROXIDE-SIMETH 200-200-20 MG/5ML PO SUSP: 30.0000 mL | ORAL | Status: DC | PRN

## 2022-02-21 MED ORDER — ESCITALOPRAM OXALATE 10 MG PO TABS
10.0000 mg | ORAL_TABLET | Freq: Every day | ORAL | Status: DC
  Administered 2022-02-22: 10 mg via ORAL
  Filled 2022-02-21: qty 1

## 2022-02-21 MED ORDER — ACETAMINOPHEN 325 MG PO TABS: 650.0000 mg | ORAL_TABLET | Freq: Four times a day (QID) | ORAL | Status: DC | PRN

## 2022-02-21 MED ORDER — ALBUTEROL SULFATE HFA 108 (90 BASE) MCG/ACT IN AERS: 2.0000 | INHALATION_SPRAY | Freq: Four times a day (QID) | RESPIRATORY_TRACT | Status: DC | PRN

## 2022-02-21 MED ORDER — PANTOPRAZOLE SODIUM 40 MG PO TBEC
40.0000 mg | DELAYED_RELEASE_TABLET | Freq: Every day | ORAL | Status: DC
  Administered 2022-02-22: 40 mg via ORAL
  Filled 2022-02-21: qty 1

## 2022-02-21 MED ORDER — MAGNESIUM HYDROXIDE 400 MG/5ML PO SUSP: 30.0000 mL | Freq: Every day | ORAL | Status: DC | PRN

## 2022-02-21 NOTE — ED Notes (Signed)
Pt was given a UDS specimen cup due to she could not urinate at this time

## 2022-02-21 NOTE — ED Triage Notes (Signed)
Pt presents to [facility] voluntarily accompanied by [employer], with complaint of suicidal ideation with a plan to cut her throat. Pt states " I need to be here cause I feel safer in a hospital". Pt endorses auditory hallucinations and the voices are telling her to "kill herself". Pt denies HI, VH and substance/alcohol use.

## 2022-02-21 NOTE — ED Provider Notes (Signed)
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   Comment: (NOTE) Fact Sheet for Patients: EntrepreneurPulse.com.au  Fact Sheet for Healthcare Providers: IncredibleEmployment.be  This test is not yet approved or cleared by the [place] FDA and has been authorized for detection and/or diagnosis of SARS-CoV-2 by FDA under an Emergency Use Authorization (EUA). This EUA will remain in effect (meaning this test can be used) for the duration of the COVID-19 declaration under Section 564(b)(1) of the Act, 21 U.S.C. section 360bbb-3(b)(1), unless the authorization is terminated or revoked.  Performed at [facility], [place] [address] [place], [place] [address]    WBC 01/30/2022 12.5 (H)  4.0 - 10.5 K/uL Final   RBC 01/30/2022 5.25 (H)  3.87 - 5.11 MIL/uL Final   Hemoglobin 01/30/2022 13.4  12.0 - 15.0 g/dL Final   HCT 01/30/2022 42.8  36.0 - 46.0 % Final   MCV 01/30/2022 81.5  80.0 - 100.0 fL Final   MCH 01/30/2022 25.5 (L)  26.0 - 34.0 pg Final   MCHC 01/30/2022 31.3  30.0 - 36.0 g/dL Final   RDW 01/30/2022 14.0  11.5 - 15.5 % Final   Platelets 01/30/2022 293  150 - 400 K/uL Final   nRBC 01/30/2022 0.0  0.0 - 0.2 % Final   Neutrophils Relative % 01/30/2022 66  % Final   Neutro Abs 01/30/2022 8.2 (H)  1.7 - 7.7 K/uL Final   Lymphocytes Relative 01/30/2022 28  % Final   Lymphs Abs 01/30/2022 3.5  0.7 - 4.0 K/uL Final   Monocytes Relative 01/30/2022 5  % Final   Monocytes Absolute 01/30/2022 0.6  0.1 - 1.0 K/uL Final   Eosinophils Relative 01/30/2022 1  % Final   Eosinophils Absolute 01/30/2022 0.1  0.0 - 0.5 K/uL Final   Basophils Relative 01/30/2022 0  % Final   Basophils Absolute 01/30/2022 0.0  0.0 - 0.1 K/uL Final   Immature Granulocytes 01/30/2022 0  % Final   Abs Immature Granulocytes 01/30/2022 0.04  0.00 - 0.07 K/uL Final   Performed at [facility], [place] [address] [place], [place] [address]   Sodium 01/30/2022  139  135 - 145 mmol/L Final   Potassium 01/30/2022 3.8  3.5 - 5.1 mmol/L Final   Chloride 01/30/2022 108  98 - 111 mmol/L Final   CO2 01/30/2022 23  22 - 32 mmol/L Final   Glucose, Bld 01/30/2022 92  70 - 99 mg/dL Final   Glucose reference range applies only to samples taken after fasting for at least 8 hours.   BUN 01/30/2022 14  6 - 20 mg/dL Final   Creatinine, Ser 01/30/2022 0.92  0.44 - 1.00 mg/dL Final   Calcium 01/30/2022 9.7  8.9 - 10.3 mg/dL Final   Total Protein 01/30/2022 8.2 (H)  6.5 - 8.1 g/dL Final   Albumin 01/30/2022 4.6  3.5 - 5.0 g/dL Final   AST 01/30/2022 18  15 - 41 U/L Final   ALT 01/30/2022 15  0 - 44 U/L Final   Alkaline Phosphatase 01/30/2022 65  38 - 126 U/L Final   Total Bilirubin 01/30/2022 0.4  0.3 - 1.2 mg/dL Final   GFR, Estimated 01/30/2022 >60  >60 mL/min Final   Comment: (NOTE) Calculated using the CKD-EPI Creatinine Equation (2021)    Anion gap 01/30/2022 8  5 - 15 Final   Performed at [facility] [address], [place], [place] [address]   Preg Test, Ur 01/30/2022 Negative  Negative Final   POC Amphetamine UR 01/30/2022 None Detected  NONE DETECTED (Cut Off Level 1000 ng/mL) Final   POC Secobarbital (BAR) 01/30/2022 None Detected  NONE DETECTED (Cut Off Level 300 ng/mL) Final   POC Buprenorphine (BUP) 01/30/2022 None Detected  NONE DETECTED (Cut Off Level 10 ng/mL) Final   POC Oxazepam (BZO) 01/30/2022 None Detected  NONE DETECTED (Cut Off Level 300 ng/mL) Final   POC Cocaine UR 01/30/2022 None Detected  NONE DETECTED (Cut Off Level 300 ng/mL) Final   POC Methamphetamine UR 01/30/2022 None Detected  NONE DETECTED (Cut Off Level 1000 ng/mL) Final   POC Morphine 01/30/2022 None Detected  NONE DETECTED (Cut Off Level 300 ng/mL) Final   POC Methadone UR 01/30/2022 None Detected  NONE DETECTED (Cut Off Level 300 ng/mL) Final   POC Oxycodone UR 01/30/2022 None Detected  NONE DETECTED (Cut Off Level 100 ng/mL) Final   POC Marijuana UR 01/30/2022  None Detected  NONE DETECTED (Cut Off Level 50 ng/mL) Final   SARSCOV2ONAVIRUS 2 AG 01/30/2022 NEGATIVE  NEGATIVE Final   Comment: (NOTE) SARS-CoV-2 antigen NOT DETECTED.   Negative results are presumptive.  Negative results do not preclude SARS-CoV-2 infection and should not be used as the sole basis for treatment or other patient management decisions, including infection  control decisions, particularly in the presence of clinical signs and  symptoms consistent with COVID-19, or in those who have been in contact with the virus.  Negative results must be combined with clinical observations, patient history, and epidemiological information. The expected result is Negative.  Fact Sheet for Patients: HandmadeRecipes.com.cy  Fact Sheet for Healthcare Providers: FuneralLife.at  This test is not yet approved or cleared by the [place] FDA and  has been authorized for detection and/or diagnosis of SARS-CoV-2 by FDA under an Emergency Use Authorization (EUA).  This EUA will remain in effect (meaning this test  can be used) for the duration of  the COV                          ID-19 declaration under Section 564(b)(1) of the Act, 21 U.S.C. section 360bbb-3(b)(1), unless the authorization is terminated or revoked sooner.    Admission on 01/21/2022, Discharged on 01/22/2022  Component Date Value Ref Range Status   Sodium 01/21/2022 140  135 - 145 mmol/L Final   Potassium 01/21/2022 3.9  3.5 - 5.1 mmol/L Final   Chloride 01/21/2022 110  98 - 111 mmol/L Final   CO2 01/21/2022 19 (L)  22 - 32 mmol/L Final   Glucose, Bld 01/21/2022 98  70 - 99 mg/dL Final   Glucose reference range applies only to samples taken after fasting for at least 8 hours.   BUN 01/21/2022 7  6 - 20 mg/dL Final   Creatinine, Ser 01/21/2022 0.60  0.44 - 1.00 mg/dL Final   Calcium 01/21/2022 9.2  8.9 - 10.3 mg/dL Final   GFR, Estimated 01/21/2022 >60  >60 mL/min Final    Comment: (NOTE) Calculated using the CKD-EPI Creatinine Equation (2021)    Anion gap 01/21/2022 11  5 - 15 Final   Performed at [facility], [place] [address], [place], [place] [address]   WBC 01/21/2022 6.7  4.0 - 10.5 K/uL Final   RBC 01/21/2022 4.94  3.87 - 5.11 MIL/uL Final   Hemoglobin 01/21/2022 12.7  12.0 - 15.0 g/dL Final   HCT 01/21/2022 40.9  36.0 - 46.0 % Final   MCV 01/21/2022 82.8  80.0 - 100.0 fL Final   MCH 01/21/2022 25.7 (L)  26.0 - 34.0 pg Final   MCHC 01/21/2022 31.1  30.0 - 36.0 g/dL Final   RDW 01/21/2022 13.7  11.5 - 15.5 % Final   Platelets 01/21/2022 328  150 - 400 K/uL Final   nRBC 01/21/2022 0.0  0.0 - 0.2 % Final   Performed at [facility] [address], [place], [place] [address]   Troponin I (High Sensitivity) 01/21/2022 <2  <18 ng/L Final   Comment: (NOTE) Elevated high sensitivity troponin I (hsTnI) values and significant  changes across serial measurements may suggest ACS but many other  chronic and acute conditions are known to elevate hsTnI results.  Refer to the "Links" section for chest pain algorithms and additional  guidance. Performed at [facility], [place] [address] [place], [place] [address]    I-stat hCG, quantitative 01/21/2022 <5.0  <5 mIU/mL Final   Comment 3 01/21/2022          Final   Comment:   GEST. AGE      CONC.  (mIU/mL)   <=1 WEEK        5 - 50     2 WEEKS       50 - 500     3 WEEKS       100 - 10,000     4 WEEKS     1,000 - 30,000        FEMALE AND NON-PREGNANT FEMALE:     LESS THAN 5 mIU/mL    Troponin I (High Sensitivity) 01/22/2022 <2  <18 ng/L Final   Comment: (NOTE) Elevated high sensitivity troponin I (hsTnI) values and significant  changes across serial measurements may suggest ACS but many other  chronic and acute conditions are known to elevate hsTnI results.  Refer to the "Links" section  for chest pain algorithms and additional  guidance. Performed at [facility], [place] [address]  [place], [place] [address]   Admission on 01/18/2022, Discharged on 01/18/2022  Component Date Value Ref Range Status   WBC 01/18/2022 7.1  4.0 - 10.5 K/uL Final   RBC 01/18/2022 4.59  3.87 - 5.11 MIL/uL Final   Hemoglobin 01/18/2022 11.6 (L)  12.0 - 15.0 g/dL Final   HCT 01/18/2022 37.2  36.0 - 46.0 % Final   MCV 01/18/2022 81.0  80.0 - 100.0 fL Final   MCH 01/18/2022 25.3 (L)  26.0 - 34.0 pg Final   MCHC 01/18/2022 31.2  30.0 - 36.0 g/dL Final   RDW 01/18/2022 14.2  11.5 - 15.5 % Final   Platelets 01/18/2022 293  150 - 400 K/uL Final   nRBC 01/18/2022 0.0  0.0 - 0.2 % Final   Neutrophils Relative % 01/18/2022 82  % Final   Neutro Abs 01/18/2022 5.8  1.7 - 7.7 K/uL Final   Lymphocytes Relative 01/18/2022 10  % Final   Lymphs Abs 01/18/2022 0.7  0.7 - 4.0 K/uL Final   Monocytes Relative 01/18/2022 8  % Final   Monocytes Absolute 01/18/2022 0.5  0.1 - 1.0 K/uL Final   Eosinophils Relative 01/18/2022 0  % Final   Eosinophils Absolute 01/18/2022 0.0  0.0 - 0.5 K/uL Final   Basophils Relative 01/18/2022 0  % Final   Basophils Absolute 01/18/2022 0.0  0.0 - 0.1 K/uL Final   Immature Granulocytes 01/18/2022 0  % Final   Abs Immature Granulocytes 01/18/2022 0.03  0.00 - 0.07 K/uL Final   Performed at [facility], [place] [address], [place], [place] [address]   Sodium 01/18/2022 138  135 - 145 mmol/L Final   Potassium 01/18/2022 3.6  3.5 - 5.1 mmol/L Final   Chloride 01/18/2022 107  98 - 111 mmol/L Final   CO2 01/18/2022 21 (L)  22 - 32 mmol/L Final   Glucose, Bld 01/18/2022 103 (H)  70 - 99 mg/dL Final   Glucose reference range applies only to samples taken after fasting for at least 8 hours.   BUN 01/18/2022 10  6 - 20 mg/dL Final   Creatinine, Ser 01/18/2022 0.79  0.44 - 1.00 mg/dL Final   Calcium 01/18/2022 9.0  8.9 - 10.3 mg/dL Final   Total Protein 01/18/2022 7.1  6.5 - 8.1 g/dL Final   Albumin 01/18/2022 3.7  3.5 - 5.0 g/dL Final   AST 01/18/2022 27  15 - 41 U/L Final   ALT  01/18/2022 19  0 - 44 U/L Final   Alkaline Phosphatase 01/18/2022 74  38 - 126 U/L Final   Total Bilirubin 01/18/2022 0.3  0.3 - 1.2 mg/dL Final   GFR, Estimated 01/18/2022 >60  >60 mL/min Final   Comment: (NOTE) Calculated using the CKD-EPI Creatinine Equation (2021)    Anion gap 01/18/2022 10  5 - 15 Final   Performed at [facility] [address], [place], [place] [address]   SARS Coronavirus 2 by RT PCR 01/18/2022 POSITIVE (A)  NEGATIVE Final   Comment: (NOTE) SARS-CoV-2 target nucleic acids are DETECTED.  The SARS-CoV-2 RNA is generally detectable in upper respiratory specimens during the acute phase of infection. Positive results are indicative of the presence of the identified virus, but do not rule out bacterial infection or co-infection with other pathogens not detected by the test. Clinical correlation with patient history and other diagnostic information is necessary to  determine patient infection status. The expected result is Negative.  Fact Sheet for Patients: EntrepreneurPulse.com.au  Fact Sheet for Healthcare Providers: IncredibleEmployment.be  This test is not yet approved or cleared by the [place] FDA and  has been authorized for detection and/or diagnosis of SARS-CoV-2 by FDA under an Emergency Use Authorization (EUA).  This EUA will remain in effect (meaning this test can be used) for the duration of  the COVID-19 declaration under Section 564(b)(1) of the A                          ct, 21 U.S.C. section 360bbb-3(b)(1), unless the authorization is terminated or revoked sooner.     Influenza A by PCR 01/18/2022 NEGATIVE  NEGATIVE Final   Influenza B by PCR 01/18/2022 NEGATIVE  NEGATIVE Final   Comment: (NOTE) The Xpert Xpress SARS-CoV-2/FLU/RSV plus assay is intended as an aid in the diagnosis of influenza from Nasopharyngeal swab specimens and should not be used as a sole basis for treatment. Nasal washings  and aspirates are unacceptable for Xpert Xpress SARS-CoV-2/FLU/RSV testing.  Fact Sheet for Patients: EntrepreneurPulse.com.au  Fact Sheet for Healthcare Providers: IncredibleEmployment.be  This test is not yet approved or cleared by the [place] FDA and has been authorized for detection and/or diagnosis of SARS-CoV-2 by FDA under an Emergency Use Authorization (EUA). This EUA will remain in effect (meaning this test can be used) for the duration of the COVID-19 declaration under Section 564(b)(1) of the Act, 21 U.S.C. section 360bbb-3(b)(1), unless the authorization is terminated or revoked.     Resp Syncytial Virus by PCR 01/18/2022 NEGATIVE  NEGATIVE Final   Comment: (NOTE) Fact Sheet for Patients: EntrepreneurPulse.com.au  Fact Sheet for Healthcare Providers: IncredibleEmployment.be  This test is not yet approved or cleared by the [place] FDA and has been authorized for detection and/or diagnosis of SARS-CoV-2 by FDA under an Emergency Use Authorization (EUA). This EUA will remain in effect (meaning this test can be used) for the duration of the COVID-19 declaration under Section 564(b)(1) of the Act, 21 U.S.C. section 360bbb-3(b)(1), unless the authorization is terminated or revoked.  Performed at [facility], [place] [address], [place], [place] [address]    Lipase 01/18/2022 29  11 - 51 U/L Final   Performed at [facility] [address], [place], [place] [address]  Admission on 12/27/2021, Discharged on 01/01/2022  Component Date Value Ref Range Status   SARS Coronavirus 2 by RT PCR 12/28/2021 NEGATIVE  NEGATIVE Final   Comment: (NOTE) SARS-CoV-2 target nucleic acids are NOT DETECTED.  The SARS-CoV-2 RNA is generally detectable in upper respiratory specimens during the acute phase of infection. The lowest concentration of SARS-CoV-2 viral copies this assay can detect  is 138 copies/mL. A negative result does not preclude SARS-Cov-2 infection and should not be used as the sole basis for treatment or other patient management decisions. A negative result may occur with  improper specimen collection/handling, submission of specimen other than nasopharyngeal swab, presence of viral mutation(s) within the areas targeted by this assay, and inadequate number of viral copies(<138 copies/mL). A negative result must be combined with clinical observations, patient history, and epidemiological information. The expected result is Negative.  Fact Sheet for Patients:  EntrepreneurPulse.com.au  Fact Sheet for Healthcare Providers:  IncredibleEmployment.be  This test is no  t yet approved or cleared by the [place] and  has been authorized for detection and/or diagnosis of SARS-CoV-2 by FDA under an Emergency Use Authorization (EUA). This EUA will remain  in effect (meaning this test can be used) for the duration of the COVID-19 declaration under Section 564(b)(1) of the Act, 21 U.S.C.section 360bbb-3(b)(1), unless the authorization is terminated  or revoked sooner.       Influenza A by PCR 12/28/2021 NEGATIVE  NEGATIVE Final   Influenza B by PCR 12/28/2021 NEGATIVE  NEGATIVE Final   Comment: (NOTE) The Xpert Xpress SARS-CoV-2/FLU/RSV plus assay is intended as an aid in the diagnosis of influenza from Nasopharyngeal swab specimens and should not be used as a sole basis for treatment. Nasal washings and aspirates are unacceptable for Xpert Xpress SARS-CoV-2/FLU/RSV testing.  Fact Sheet for Patients: EntrepreneurPulse.com.au  Fact Sheet for Healthcare Providers: IncredibleEmployment.be  This test is not yet approved or cleared by the [place] FDA and has been authorized for detection and/or diagnosis of SARS-CoV-2 by FDA under an Emergency Use  Authorization (EUA). This EUA will remain in effect (meaning this test can be used) for the duration of the COVID-19 declaration under Section 564(b)(1) of the Act, 21 U.S.C. section 360bbb-3(b)(1), unless the authorization is terminated or revoked.  Performed at [facility], [place] [address] [place], [place] [address]    Sodium 12/28/2021 142  135 - 145 mmol/L Final   Potassium 12/28/2021 3.8  3.5 - 5.1 mmol/L Final   Chloride 12/28/2021 110  98 - 111 mmol/L Final   BUN 12/28/2021 <3 (L)  6 - 20 mg/dL Final   Creatinine, Ser 12/28/2021 0.60  0.44 - 1.00 mg/dL Final   Glucose, Bld 12/28/2021 91  70 - 99 mg/dL Final   Glucose reference range applies only to samples taken after fasting for at least 8 hours.   Calcium, Ion 12/28/2021 1.12 (L)  1.15 - 1.40 mmol/L Final   TCO2 12/28/2021 21 (L)  22 - 32 mmol/L Final   Hemoglobin 12/28/2021 10.5 (L)  12.0 - 15.0 g/dL Final   HCT 12/28/2021 31.0 (L)  36.0 - 46.0 % Final   I-stat hCG, quantitative 12/28/2021 <5.0  <5 mIU/mL Final   Comment 3 12/28/2021          Final   Comment:   GEST. AGE      CONC.  (mIU/mL)   <=1 WEEK        5 - 50     2 WEEKS       50 - 500     3 WEEKS       100 - 10,000     4 WEEKS     1,000 - 30,000        FEMALE AND NON-PREGNANT FEMALE:     LESS THAN 5 mIU/mL    Sodium 12/28/2021 141  135 - 145 mmol/L Final   Potassium 12/28/2021 3.7  3.5 - 5.1 mmol/L Final   Chloride 12/28/2021 112 (H)  98 - 111 mmol/L Final   CO2 12/28/2021 21 (L)  22 - 32 mmol/L Final   Glucose, Bld 12/28/2021 93  70 - 99 mg/dL Final   Glucose reference range applies only to samples taken after fasting for at least 8 hours.   BUN 12/28/2021 <5 (L)  6 - 20 mg/dL Final   Creatinine, Ser 12/28/2021 0.70  0.44 - 1.00 mg/dL Final   Calcium 12/28/2021 8.1 (L)  8.9 - 10.3 mg/dL Final  Total Protein 12/28/2021 5.6 (L)  6.5 - 8.1 g/dL Final   Albumin 12/28/2021 3.2 (L)  3.5 - 5.0 g/dL Final   AST 12/28/2021 15  15 - 41 U/L Final   ALT  12/28/2021 12  0 - 44 U/L Final   Alkaline Phosphatase 12/28/2021 56  38 - 126 U/L Final   Total Bilirubin 12/28/2021 0.3  0.3 - 1.2 mg/dL Final   GFR, Estimated 12/28/2021 >60  >60 mL/min Final   Comment: (NOTE) Calculated using the CKD-EPI Creatinine Equation (2021)    Anion gap 12/28/2021 8  5 - 15 Final   Performed at [facility], [place] [address], [place], [place] [address]   WBC 12/28/2021 6.8  4.0 - 10.5 K/uL Final   RBC 12/28/2021 4.16  3.87 - 5.11 MIL/uL Final   Hemoglobin 12/28/2021 10.6 (L)  12.0 - 15.0 g/dL Final   HCT 12/28/2021 34.7 (L)  36.0 - 46.0 % Final   MCV 12/28/2021 83.4  80.0 - 100.0 fL Final   MCH 12/28/2021 25.5 (L)  26.0 - 34.0 pg Final   MCHC 12/28/2021 30.5  30.0 - 36.0 g/dL Final   RDW 12/28/2021 13.5  11.5 - 15.5 % Final   Platelets 12/28/2021 142 (L)  150 - 400 K/uL Final   REPEATED TO VERIFY   nRBC 12/28/2021 0.0  0.0 - 0.2 % Final   Neutrophils Relative % 12/28/2021 52  % Final   Neutro Abs 12/28/2021 3.5  1.7 - 7.7 K/uL Final   Lymphocytes Relative 12/28/2021 41  % Final   Lymphs Abs 12/28/2021 2.8  0.7 - 4.0 K/uL Final   Monocytes Relative 12/28/2021 6  % Final   Monocytes Absolute 12/28/2021 0.4  0.1 - 1.0 K/uL Final   Eosinophils Relative 12/28/2021 1  % Final   Eosinophils Absolute 12/28/2021 0.0  0.0 - 0.5 K/uL Final   Basophils Relative 12/28/2021 0  % Final   Basophils Absolute 12/28/2021 0.0  0.0 - 0.1 K/uL Final   Immature Granulocytes 12/28/2021 0  % Final   Abs Immature Granulocytes 12/28/2021 0.02  0.00 - 0.07 K/uL Final   Performed at [facility], [place] [address], [place], [place] [address]   Color, Urine 12/28/2021 YELLOW  YELLOW Final   APPearance 12/28/2021 CLEAR  CLEAR Final   Specific Gravity, Urine 12/28/2021 1.006  1.005 - 1.030 Final   pH 12/28/2021 8.0  5.0 - 8.0 Final   Glucose, UA 12/28/2021 NEGATIVE  NEGATIVE mg/dL Final   Hgb urine dipstick 12/28/2021 NEGATIVE  NEGATIVE Final   Bilirubin Urine 12/28/2021  NEGATIVE  NEGATIVE Final   Ketones, ur 12/28/2021 NEGATIVE  NEGATIVE mg/dL Final   Protein, ur 12/28/2021 NEGATIVE  NEGATIVE mg/dL Final   Nitrite 12/28/2021 NEGATIVE  NEGATIVE Final   Leukocytes,Ua 12/28/2021 SMALL (A)  NEGATIVE Final   RBC / HPF 12/28/2021 0-5  0 - 5 RBC/hpf Final   WBC, UA 12/28/2021 0-5  0 - 5 WBC/hpf Final   Bacteria, UA 12/28/2021 RARE (A)  NONE SEEN Final   Squamous Epithelial / HPF 12/28/2021 6-10  0 - 5 Final   Performed at [facility], [place] [address], [place], [place] [address]   Salicylate Lvl 82/50/0370 <7.0 (L)  7.0 - 30.0 mg/dL Final   Performed at [facility] [address] [place], [place] [address]   Acetaminophen (Tylenol), Serum 12/28/2021 <10 (L)  10 - 30 ug/mL Final   Comment: (NOTE) Therapeutic concentrations vary significantly.  A range of 10-30 ug/mL  may be an effective concentration for many patients. However, some  are best treated at concentrations outside of this range. Acetaminophen concentrations >150 ug/mL at 4 hours after ingestion  and >50 ug/mL at 12 hours after ingestion are often associated with  toxic reactions.  Performed at [facility], [place] [address], [place], [place] [address]    Opiates 12/28/2021 NONE DETECTED  NONE DETECTED Final   Cocaine 12/28/2021 NONE DETECTED  NONE DETECTED Final   Benzodiazepines 12/28/2021 NONE DETECTED  NONE DETECTED Final   Amphetamines 12/28/2021 NONE DETECTED  NONE DETECTED Final   Tetrahydrocannabinol 12/28/2021 NONE DETECTED  NONE DETECTED Final   Barbiturates 12/28/2021 NONE DETECTED  NONE DETECTED Final   Comment: (NOTE) DRUG SCREEN FOR MEDICAL PURPOSES ONLY.  IF CONFIRMATION IS NEEDED FOR ANY PURPOSE, NOTIFY LAB WITHIN 5 DAYS.  LOWEST DETECTABLE LIMITS FOR URINE DRUG SCREEN Drug Class                     Cutoff (ng/mL) Amphetamine and metabolites    1000 Barbiturate and metabolites    200 Benzodiazepine                 200 Opiates and metabolites         300 Cocaine and metabolites        300 THC                            50 Performed at [facility], [place] [address] [place], [place] [address]    WBC 12/28/2021 8.5  4.0 - 10.5 K/uL Final   RBC 12/28/2021 4.31  3.87 - 5.11 MIL/uL Final   Hemoglobin 12/28/2021 11.2 (L)  12.0 - 15.0 g/dL Final   HCT 12/28/2021 36.0  36.0 - 46.0 % Final   MCV 12/28/2021 83.5  80.0 - 100.0 fL Final   MCH 12/28/2021 26.0  26.0 - 34.0 pg Final   MCHC 12/28/2021 31.1  30.0 - 36.0 g/dL Final   RDW 12/28/2021 13.6  11.5 - 15.5 % Final   Platelets 12/28/2021 262  150 - 400 K/uL Final   nRBC 12/28/2021 0.0  0.0 - 0.2 % Final   Performed at [facility] [address], [place], [place] [address]   Sodium 12/28/2021 141  135 - 145 mmol/L Final   Potassium 12/28/2021 3.9  3.5 - 5.1 mmol/L Final   Chloride 12/28/2021 110  98 - 111 mmol/L Final   CO2 12/28/2021 19 (L)  22 - 32 mmol/L Final   Glucose, Bld 12/28/2021 103 (H)  70 - 99 mg/dL Final   Glucose reference range applies only to samples taken after fasting for at least 8 hours.   BUN 12/28/2021 <5 (L)  6 - 20 mg/dL Final   Creatinine, Ser 12/28/2021 0.73  0.44 - 1.00 mg/dL Final   Calcium 12/28/2021 8.1 (L)  8.9 - 10.3 mg/dL Final   GFR, Estimated 12/28/2021 >60  >60 mL/min Final   Comment: (NOTE) Calculated using the CKD-EPI Creatinine Equation (2021)    Anion gap 12/28/2021 12  5 - 15 Final   Performed at [facility], [place] [address] [place], [place] [address]   Magnesium 12/28/2021 1.8  1.7 - 2.4 mg/dL Final   Performed at [facility] [address], [place],  [address]   Phosphorus 12/28/2021 3.1  2.5 - 4.6 mg/dL  Final   Performed at [facility], [place] [address], [place], [place] [address]   Total Protein 12/28/2021 5.9 (L)  6.5 - 8.1 g/dL Final   Albumin 12/28/2021 3.3 (L)  3.5 - 5.0 g/dL Final   AST 12/28/2021 17  15 - 41 U/L Final   ALT 12/28/2021 11  0 - 44 U/L Final   Alkaline Phosphatase 12/28/2021 57   38 - 126 U/L Final   Total Bilirubin 12/28/2021 0.3  0.3 - 1.2 mg/dL Final   Bilirubin, Direct 12/28/2021 <0.1  0.0 - 0.2 mg/dL Final   Indirect Bilirubin 12/28/2021 NOT CALCULATED  0.3 - 0.9 mg/dL Final   Performed at [facility] [address] [place], [place] [address]   MRSA by PCR Next Gen 12/28/2021 NOT DETECTED  NOT DETECTED Final   Comment: (NOTE) The GeneXpert MRSA Assay (FDA approved for NASAL specimens only), is one component of a comprehensive MRSA colonization surveillance program. It is not intended to diagnose MRSA infection nor to guide or monitor treatment for MRSA infections. Test performance is not FDA approved in patients less than 58 years old. Performed at [facility], [place] [address] [place], [place] [address]    WBC 12/30/2021 10.9 (H)  4.0 - 10.5 K/uL Final   RBC 12/30/2021 5.01  3.87 - 5.11 MIL/uL Final   Hemoglobin 12/30/2021 13.1  12.0 - 15.0 g/dL Final   HCT 12/30/2021 40.1  36.0 - 46.0 % Final   MCV 12/30/2021 80.0  80.0 - 100.0 fL Final   MCH 12/30/2021 26.1  26.0 - 34.0 pg Final   MCHC 12/30/2021 32.7  30.0 - 36.0 g/dL Final   RDW 12/30/2021 13.9  11.5 - 15.5 % Final   Platelets 12/30/2021 254  150 - 400 K/uL Final   nRBC 12/30/2021 0.0  0.0 - 0.2 % Final   Performed at [facility] [address], [place], [place] [address]   Sodium 12/30/2021 136  135 - 145 mmol/L Final   Potassium 12/30/2021 3.6  3.5 - 5.1 mmol/L Final   Chloride 12/30/2021 105  98 - 111 mmol/L Final   CO2 12/30/2021 18 (L)  22 - 32 mmol/L Final   Glucose, Bld 12/30/2021 106 (H)  70 - 99 mg/dL Final   Glucose reference range applies only to samples taken after fasting for at least 8 hours.   BUN 12/30/2021 10  6 - 20 mg/dL Final   Creatinine, Ser 12/30/2021 0.70  0.44 - 1.00 mg/dL Final   Calcium 12/30/2021 9.4  8.9 - 10.3 mg/dL Final   GFR, Estimated 12/30/2021 >60  >60 mL/min Final   Comment: (NOTE) Calculated using the CKD-EPI Creatinine Equation  (2021)    Anion gap 12/30/2021 13  5 - 15 Final   Performed at [facility], [place] [address], [place], [place] [address]   Magnesium 12/30/2021 1.9  1.7 - 2.4 mg/dL Final   Performed at [facility] [address] [place], [place] [address]   Phosphorus 12/30/2021 4.1  2.5 - 4.6 mg/dL Final   Performed at [facility] [address], [place], [place] [address]   Total Protein 12/30/2021 7.7  6.5 - 8.1 g/dL Final   Albumin 12/30/2021 4.3  3.5 - 5.0 g/dL Final   AST 12/30/2021 19  15 - 41 U/L Final   ALT 12/30/2021 11  0 - 44 U/L Final   Alkaline Phosphatase 12/30/2021 65  38 -  126 U/L Final   Total Bilirubin 12/30/2021 0.3  0.3 - 1.2 mg/dL Final   Bilirubin, Direct 12/30/2021 <0.1  0.0 - 0.2 mg/dL Final   Indirect Bilirubin 12/30/2021 NOT CALCULATED  0.3 - 0.9 mg/dL Final   Performed at [facility] [address], [place], [place] [address]   SARS Coronavirus 2 by RT PCR 12/30/2021 NEGATIVE  NEGATIVE Final   Comment: (NOTE) SARS-CoV-2 target nucleic acids are NOT DETECTED.  The SARS-CoV-2 RNA is generally detectable in upper and lower respiratory specimens during the acute phase of infection. The lowest concentration of SARS-CoV-2 viral copies this assay can detect is 250 copies / mL. A negative result does not preclude SARS-CoV-2 infection and should not be used as the sole basis for treatment or other patient management decisions.  A negative result may occur with improper specimen collection / handling, submission of specimen other than nasopharyngeal swab, presence of viral mutation(s) within the areas targeted by this assay, and inadequate number of viral copies (<250 copies / mL). A negative result must be combined with clinical observations, patient history, and epidemiological information.  Fact Sheet for Patients:   https://www.patel.info/  Fact Sheet for Healthcare Providers: https://hall.com/  This  test is not yet approved or                           cleared by the [place] FDA and has been authorized for detection and/or diagnosis of SARS-CoV-2 by FDA under an Emergency Use Authorization (EUA).  This EUA will remain in effect (meaning this test can be used) for the duration of the COVID-19 declaration under Section 564(b)(1) of the Act, 21 U.S.C. section 360bbb-3(b)(1), unless the authorization is terminated or revoked sooner.  Performed at [facility], [place] [address], [place], [place] [address]    WBC 01/01/2022 8.7  4.0 - 10.5 K/uL Final   RBC 01/01/2022 4.72  3.87 - 5.11 MIL/uL Final   Hemoglobin 01/01/2022 12.1  12.0 - 15.0 g/dL Final   HCT 01/01/2022 37.6  36.0 - 46.0 % Final   MCV 01/01/2022 79.7 (L)  80.0 - 100.0 fL Final   MCH 01/01/2022 25.6 (L)  26.0 - 34.0 pg Final   MCHC 01/01/2022 32.2  30.0 - 36.0 g/dL Final   RDW 01/01/2022 13.8  11.5 - 15.5 % Final   Platelets 01/01/2022 271  150 - 400 K/uL Final   nRBC 01/01/2022 0.0  0.0 - 0.2 % Final   Performed at [facility] [address] [place], [place] [address]   Sodium 01/01/2022 135  135 - 145 mmol/L Final   Potassium 01/01/2022 3.4 (L)  3.5 - 5.1 mmol/L Final   Chloride 01/01/2022 104  98 - 111 mmol/L Final   CO2 01/01/2022 21 (L)  22 - 32 mmol/L Final   Glucose, Bld 01/01/2022 106 (H)  70 - 99 mg/dL Final   Glucose reference range applies only to samples taken after fasting for at least 8 hours.   BUN 01/01/2022 10  6 - 20 mg/dL Final   Creatinine, Ser 01/01/2022 0.70  0.44 - 1.00 mg/dL Final   Calcium 01/01/2022 9.2  8.9 - 10.3 mg/dL Final   GFR, Estimated 01/01/2022 >60  >60 mL/min Final   Comment: (NOTE) Calculated using the CKD-EPI Creatinine Equation (2021)    Anion gap 01/01/2022 10  5 - 15 Final   Performed at [facility], [place]  [address], [place], [place] [address]   Magnesium 01/01/2022 2.0  1.7 - 2.4 mg/dL Final   Performed at [facility] [address],  [place], [place] [address]   Phosphorus 01/01/2022 3.8  2.5 - 4.6 mg/dL Final   Performed at [facility] [address], [place], [place] [address]   Total Protein 01/01/2022 7.5  6.5 - 8.1 g/dL Final   Albumin 01/01/2022 4.0  3.5 - 5.0 g/dL Final   AST 01/01/2022 18  15 - 41 U/L Final   ALT 01/01/2022 14  0 - 44 U/L Final   Alkaline Phosphatase 01/01/2022 57  38 - 126 U/L Final   Total Bilirubin 01/01/2022 0.7  0.3 - 1.2 mg/dL Final   Bilirubin, Direct 01/01/2022 <0.1  0.0 - 0.2 mg/dL Final   Indirect Bilirubin 01/01/2022 NOT CALCULATED  0.3 - 0.9 mg/dL Final   Performed at [facility], [place] [address] [place], [place] [address]   SARS Coronavirus 2 by RT PCR 01/01/2022 NEGATIVE  NEGATIVE Final   Comment: (NOTE) SARS-CoV-2 target nucleic acids are NOT DETECTED.  The SARS-CoV-2 RNA is generally detectable in upper and lower respiratory specimens during the acute phase of infection. The lowest concentration of SARS-CoV-2 viral copies this assay can detect is 250 copies / mL. A negative result does not preclude SARS-CoV-2 infection and should not be used as the sole basis for treatment or other patient management decisions.  A negative result may occur with improper specimen collection / handling, submission of specimen other than nasopharyngeal swab, presence of viral mutation(s) within the areas targeted by this assay, and inadequate number of viral copies (<250 copies / mL). A negative result must be combined with clinical observations, patient history, and epidemiological information.  Fact Sheet for Patients:   https://www.patel.info/  Fact Sheet for Healthcare Providers: https://hall.com/  This test is not yet approved or                           cleared by the [place] FDA and has been authorized for detection and/or diagnosis of SARS-CoV-2 by FDA under an Emergency Use Authorization (EUA).  This EUA will remain in  effect (meaning this test can be used) for the duration of the COVID-19 declaration under Section 564(b)(1) of the Act, 21 U.S.C. section 360bbb-3(b)(1), unless the authorization is terminated or revoked sooner.  Performed at [facility], [place] [address], [place],  [address]   Admission on 12/12/2021, Discharged on 12/13/2021  Component Date Value Ref Range Status   Sodium 12/12/2021 139  135 - 145 mmol/L Final   Potassium 12/12/2021 3.8  3.5 - 5.1 mmol/L Final   Chloride 12/12/2021 108  98 - 111 mmol/L Final   CO2 12/12/2021 18 (L)  22 - 32 mmol/L Final   Glucose, Bld 12/12/2021 106 (H)  70 - 99 mg/dL Final   Glucose reference range applies only to samples taken after fasting for at least 8 hours.   BUN 12/12/2021 7  6 - 20 mg/dL Final   Creatinine, Ser 12/12/2021 0.75  0.44 - 1.00 mg/dL Final   Calcium 12/12/2021 9.4  8.9 - 10.3 mg/dL Final   GFR, Estimated 12/12/2021 >60  >60 mL/min Final   Comment: (NOTE) Calculated using the CKD-EPI Creatinine Equation (2021)    Anion gap 12/12/2021 13  5 - 15 Final   Performed at [facility], [place] [address], [place], [place]  [address]   WBC 12/12/2021 11.7 (H)  4.0 - 10.5 K/uL Final   RBC 12/12/2021 4.84  3.87 - 5.11 MIL/uL Final   Hemoglobin 12/12/2021 12.5  12.0 - 15.0 g/dL Final   HCT 12/12/2021 39.1  36.0 - 46.0 % Final   MCV 12/12/2021 80.8  80.0 - 100.0 fL Final   MCH 12/12/2021 25.8 (L)  26.0 - 34.0 pg Final   MCHC 12/12/2021 32.0  30.0 - 36.0 g/dL Final   RDW 12/12/2021 13.7  11.5 - 15.5 % Final   Platelets 12/12/2021 325  150 - 400 K/uL Final   nRBC 12/12/2021 0.0  0.0 - 0.2 % Final   Performed at [facility], [place] [address], [place], [place] [address]   Troponin I (High Sensitivity) 12/12/2021 8  <18 ng/L Final   Comment: (NOTE) Elevated high sensitivity troponin I (hsTnI) values and significant  changes across serial measurements may suggest ACS but many other  chronic and acute conditions are known  to elevate hsTnI results.  Refer to the "Links" section for chest pain algorithms and additional  guidance. Performed at [facility], [place] [address], [place], [place] [address]    I-stat hCG, quantitative 12/12/2021 <5.0  <5 mIU/mL Final   Comment 3 12/12/2021          Final   Comment:   GEST. AGE      CONC.  (mIU/mL)   <=1 WEEK        5 - 50     2 WEEKS       50 - 500     3 WEEKS       100 - 10,000     4 WEEKS     1,000 - 30,000        FEMALE AND NON-PREGNANT FEMALE:     LESS THAN 5 mIU/mL    Troponin I (High Sensitivity) 12/13/2021 4  <18 ng/L Final   Comment: (NOTE) Elevated high sensitivity troponin I (hsTnI) values and significant  changes across serial measurements may suggest ACS but many other  chronic and acute conditions are known to elevate hsTnI results.  Refer to the "Links" section for chest pain algorithms and additional  guidance. Performed at [facility], [place] [address] [place], [place] [address]   Admission on 12/10/2021, Discharged on 12/11/2021  Component Date Value Ref Range Status   SARS Coronavirus 2 by RT PCR 12/10/2021 NEGATIVE  NEGATIVE Final   Comment: (NOTE) SARS-CoV-2 target nucleic acids are NOT DETECTED.  The SARS-CoV-2 RNA is generally detectable in upper respiratory specimens during the acute phase of infection. The lowest concentration of SARS-CoV-2 viral copies this assay can detect is 138 copies/mL. A negative result does not preclude SARS-Cov-2 infection and should not be used as the sole basis for treatment or other patient management decisions. A negative result may occur with  improper specimen collection/handling, submission of specimen other than nasopharyngeal swab, presence of viral mutation(s) within the areas targeted by this assay, and inadequate number of viral copies(<138 copies/mL). A negative result must be combined with clinical observations, patient history, and epidemiological information. The expected  result is Negative.  Fact Sheet for Patients:  EntrepreneurPulse.com.au  Fact Sheet for Healthcare Providers:  IncredibleEmployment.be  This test is no                          t yet approved or cleared by the [place] FDA  and  has been authorized for detection and/or diagnosis of SARS-CoV-2 by FDA under an Emergency Use Authorization (EUA). This EUA will remain  in effect (meaning this test can be used) for the duration of the COVID-19 declaration under Section 564(b)(1) of the Act, 21 U.S.C.section 360bbb-3(b)(1), unless the authorization is terminated  or revoked sooner.       Influenza A by PCR 12/10/2021 NEGATIVE  NEGATIVE Final   Influenza B by PCR 12/10/2021 NEGATIVE  NEGATIVE Final   Comment: (NOTE) The Xpert Xpress SARS-CoV-2/FLU/RSV plus assay is intended as an aid in the diagnosis of influenza from Nasopharyngeal swab specimens and should not be used as a sole basis for treatment. Nasal washings and aspirates are unacceptable for Xpert Xpress SARS-CoV-2/FLU/RSV testing.  Fact Sheet for Patients: EntrepreneurPulse.com.au  Fact Sheet for Healthcare Providers: IncredibleEmployment.be  This test is not yet approved or cleared by the [place] FDA and has been authorized for detection and/or diagnosis of SARS-CoV-2 by FDA under an Emergency Use Authorization (EUA). This EUA will remain in effect (meaning this test can be used) for the duration of the COVID-19 declaration under Section 564(b)(1) of the Act, 21 U.S.C. section 360bbb-3(b)(1), unless the authorization is terminated or revoked.  Performed at [facility], [place] [address], [place], [place] [address]    WBC 12/10/2021 10.8 (H)  4.0 - 10.5 K/uL Final   RBC 12/10/2021 4.46  3.87 - 5.11 MIL/uL Final   Hemoglobin 12/10/2021 11.5 (L)  12.0 - 15.0 g/dL Final   HCT 12/10/2021 35.6 (L)  36.0 - 46.0 % Final   MCV 12/10/2021 79.8  (L)  80.0 - 100.0 fL Final   MCH 12/10/2021 25.8 (L)  26.0 - 34.0 pg Final   MCHC 12/10/2021 32.3  30.0 - 36.0 g/dL Final   RDW 12/10/2021 13.6  11.5 - 15.5 % Final   Platelets 12/10/2021 325  150 - 400 K/uL Final   nRBC 12/10/2021 0.0  0.0 - 0.2 % Final   Neutrophils Relative % 12/10/2021 59  % Final   Neutro Abs 12/10/2021 6.4  1.7 - 7.7 K/uL Final   Lymphocytes Relative 12/10/2021 33  % Final   Lymphs Abs 12/10/2021 3.6  0.7 - 4.0 K/uL Final   Monocytes Relative 12/10/2021 6  % Final   Monocytes Absolute 12/10/2021 0.6  0.1 - 1.0 K/uL Final   Eosinophils Relative 12/10/2021 1  % Final   Eosinophils Absolute 12/10/2021 0.1  0.0 - 0.5 K/uL Final   Basophils Relative 12/10/2021 1  % Final   Basophils Absolute 12/10/2021 0.1  0.0 - 0.1 K/uL Final   Immature Granulocytes 12/10/2021 0  % Final   Abs Immature Granulocytes 12/10/2021 0.04  0.00 - 0.07 K/uL Final   Performed at [facility], [place] [address] [place], [place] [address]   Sodium 12/10/2021 137  135 - 145 mmol/L Final   Potassium 12/10/2021 3.6  3.5 - 5.1 mmol/L Final   Chloride 12/10/2021 108  98 - 111 mmol/L Final   CO2 12/10/2021 21 (L)  22 - 32 mmol/L Final   Glucose, Bld 12/10/2021 95  70 - 99 mg/dL Final   Glucose reference range applies only to samples taken after fasting for at least 8 hours.   BUN 12/10/2021 5 (L)  6 - 20 mg/dL Final   Creatinine, Ser 12/10/2021 0.61  0.44 - 1.00 mg/dL Final   Calcium 12/10/2021 9.2  8.9 - 10.3 mg/dL Final   Total Protein 12/10/2021 6.9  6.5 - 8.1 g/dL Final   Albumin 12/10/2021 3.6  3.5 - 5.0 g/dL Final   AST 12/10/2021 17  15 - 41 U/L Final   ALT 12/10/2021 13  0 - 44 U/L Final   Alkaline Phosphatase 12/10/2021 67  38 - 126 U/L Final   Total Bilirubin 12/10/2021 0.3  0.3 - 1.2 mg/dL Final   GFR, Estimated 12/10/2021 >60  >60 mL/min Final   Comment: (NOTE) Calculated using the CKD-EPI Creatinine Equation (2021)    Anion gap 12/10/2021 8  5 - 15 Final   Performed at [facility], [place] [address], [place], [place] [address]   Cholesterol 12/10/2021 175  0 - 200 mg/dL Final   Triglycerides 12/10/2021 145  <150 mg/dL Final   HDL 12/10/2021 44  >40 mg/dL Final   Total CHOL/HDL Ratio 12/10/2021 4.0  RATIO Final   VLDL 12/10/2021 29  0 - 40 mg/dL Final   LDL Cholesterol 12/10/2021 102 (H)  0 - 99 mg/dL Final   Comment:        Total Cholesterol/HDL:CHD Risk Coronary Heart Disease Risk Table                     Men   Women  1/2 Average Risk   3.4   3.3  Average Risk       5.0   4.4  2 X Average Risk   9.6   7.1  3 X Average Risk  23.4   11.0        Use the calculated Patient Ratio above and the CHD Risk Table to determine the patient's CHD Risk.        ATP III CLASSIFICATION (LDL):  <100     mg/dL   Optimal  100-129  mg/dL   Near or Above                    Optimal  130-159  mg/dL   Borderline  160-189  mg/dL   High  >190     mg/dL   Very High Performed at [facility] [address], [place], [place] [address]    POC Amphetamine UR 12/10/2021 None Detected  NONE DETECTED (Cut Off Level 1000 ng/mL) Final   POC Secobarbital (BAR) 12/10/2021 None Detected  NONE DETECTED (Cut Off Level 300 ng/mL) Final   POC Buprenorphine (BUP) 12/10/2021 None Detected  NONE DETECTED (Cut Off Level 10 ng/mL) Final   POC Oxazepam (BZO) 12/10/2021 None Detected  NONE DETECTED (Cut Off Level 300 ng/mL) Final   POC Cocaine UR 12/10/2021 None Detected  NONE DETECTED (Cut Off Level 300 ng/mL) Final   POC Methamphetamine UR 12/10/2021 None Detected  NONE DETECTED (Cut Off Level 1000 ng/mL) Final   POC Morphine 12/10/2021 None Detected  NONE DETECTED (Cut Off Level 300 ng/mL) Final   POC Methadone UR 12/10/2021 None Detected  NONE DETECTED (Cut Off Level 300 ng/mL) Final   POC Oxycodone UR 12/10/2021 None Detected  NONE DETECTED (Cut Off Level 100 ng/mL) Final   POC Marijuana UR 12/10/2021 None Detected  NONE DETECTED (Cut Off Level 50 ng/mL) Final   SARSCOV2ONAVIRUS 2  AG 12/10/2021 NEGATIVE  NEGATIVE Final   Comment: (NOTE) SARS-CoV-2 antigen NOT DETECTED.   Negative results are presumptive.  Negative results do not preclude SARS-CoV-2 infection and should not be used as the sole basis for treatment or other patient management decisions, including infection  control decisions, particularly in the presence  of clinical signs and  symptoms consistent with COVID-19, or in those who have been in contact with the virus.  Negative results must be combined with clinical observations, patient history, and epidemiological information. The expected result is Negative.  Fact Sheet for Patients: HandmadeRecipes.com.cy  Fact Sheet for Healthcare Providers: FuneralLife.at  This test is not yet approved or cleared by the [place] FDA and  has been authorized for detection and/or diagnosis of SARS-CoV-2 by FDA under an Emergency Use Authorization (EUA).  This EUA will remain in effect (meaning this test can be used) for the duration of  the COV                          ID-19 declaration under Section 564(b)(1) of the Act, 21 U.S.C. section 360bbb-3(b)(1), unless the authorization is terminated or revoked sooner.     hCG, Beta Chain, Quant, S 12/10/2021 <1  <5 mIU/mL Final   Comment:          GEST. AGE      CONC.  (mIU/mL)   <=1 WEEK        5 - 50     2 WEEKS       50 - 500     3 WEEKS       100 - 10,000     4 WEEKS     1,000 - 30,000     5 WEEKS     3,500 - 115,000   6-8 WEEKS     12,000 - 270,000    12 WEEKS     15,000 - 220,000        FEMALE AND NON-PREGNANT FEMALE:     LESS THAN 5 mIU/mL Performed at [facility], [place] [address], [place], [place] [address]    TSH 12/10/2021 1.245  0.350 - 4.500 uIU/mL Final   Comment: Performed by a 3rd Generation assay with a functional sensitivity of <=0.01 uIU/mL. Performed at [facility], [place] [address] [place], [place] [address]   Admission on  11/29/2021, Discharged on 11/29/2021  Component Date Value Ref Range Status   WBC 11/29/2021 10.3  4.0 - 10.5 K/uL Final   RBC 11/29/2021 4.55  3.87 - 5.11 MIL/uL Final   Hemoglobin 11/29/2021 12.0  12.0 - 15.0 g/dL Final   HCT 11/29/2021 37.7  36.0 - 46.0 % Final   MCV 11/29/2021 82.9  80.0 - 100.0 fL Final   MCH 11/29/2021 26.4  26.0 - 34.0 pg Final   MCHC 11/29/2021 31.8  30.0 - 36.0 g/dL Final   RDW 11/29/2021 13.8  11.5 - 15.5 % Final   Platelets 11/29/2021 276  150 - 400 K/uL Final   nRBC 11/29/2021 0.0  0.0 - 0.2 % Final   Neutrophils Relative % 11/29/2021 58  % Final   Neutro Abs 11/29/2021 6.0  1.7 - 7.7 K/uL Final   Lymphocytes Relative 11/29/2021 35  % Final   Lymphs Abs 11/29/2021 3.6  0.7 - 4.0 K/uL Final   Monocytes Relative 11/29/2021 5  % Final   Monocytes Absolute 11/29/2021 0.5  0.1 - 1.0 K/uL Final   Eosinophils Relative 11/29/2021 1  % Final   Eosinophils Absolute 11/29/2021 0.1  0.0 - 0.5 K/uL Final   Basophils Relative 11/29/2021 1  % Final   Basophils Absolute 11/29/2021 0.1  0.0 - 0.1 K/uL Final   Immature Granulocytes 11/29/2021 0  % Final   Abs Immature Granulocytes 11/29/2021  0.03  0.00 - 0.07 K/uL Final   Performed at [facility], [place] [address], [place], [place] [address]   Sodium 11/29/2021 140  135 - 145 mmol/L Final   Potassium 11/29/2021 3.9  3.5 - 5.1 mmol/L Final   Chloride 11/29/2021 106  98 - 111 mmol/L Final   CO2 11/29/2021 23  22 - 32 mmol/L Final   Glucose, Bld 11/29/2021 101 (H)  70 - 99 mg/dL Final   Glucose reference range applies only to samples taken after fasting for at least 8 hours.   BUN 11/29/2021 6  6 - 20 mg/dL Final   Creatinine, Ser 11/29/2021 0.64  0.44 - 1.00 mg/dL Final   Calcium 11/29/2021 9.2  8.9 - 10.3 mg/dL Final   GFR, Estimated 11/29/2021 >60  >60 mL/min Final   Comment: (NOTE) Calculated using the CKD-EPI Creatinine Equation (2021)    Anion gap 11/29/2021 11  5 - 15 Final   Performed at [facility], [place] [address], [place], [place] [address]   Troponin I (High Sensitivity) 11/29/2021 3  <18 ng/L Final   Comment: (NOTE) Elevated high sensitivity troponin I (hsTnI) values and significant  changes across serial measurements may suggest ACS but many other  chronic and acute conditions are known to elevate hsTnI results.  Refer to the "Links" section for chest pain algorithms and additional  guidance. Performed at [facility], [place] [address], [place],  [address]    I-stat hCG, quantitative 11/29/2021 <5.0  <5 mIU/mL Final   Comment 3 11/29/2021          Final   Comment:   GEST. AGE      CONC.  (mIU/mL)   <=1 WEEK        5 - 50     2 WEEKS       50 - 500     3 WEEKS       100 - 10,000     4 WEEKS     1,000 - 30,000        FEMALE AND NON-PREGNANT FEMALE:     LESS THAN 5 mIU/mL   There may be more visits with results that are not included.    Allergies: Bee venom, Coconut flavor, Fish allergy, Geodon [ziprasidone hcl], Haloperidol and related, Lithobid [lithium], Roxicodone [oxycodone], Seroquel [quetiapine], Shellfish allergy, Phenergan [promethazine hcl], Prilosec [omeprazole], Sulfa antibiotics, Tegretol [carbamazepine], Prozac [fluoxetine], Tape, and Tylenol [acetaminophen]  PTA Medications: (Not in a hospital admission)   Medical Decision Making  Inpatient observation   Lab Orders         Resp panel by RT-PCR (RSV, Flu A&B, Covid) Anterior Nasal Swab         POC urine preg, ED         POCT Urine Drug Screen - (I-Screen)         POC SARS Coronavirus 2 Ag         Pregnancy, urine POC    Meds ordered this encounter  Medications   acetaminophen (TYLENOL) tablet 650 mg   alum & mag hydroxide-simeth (MAALOX/MYLANTA) 200-200-20 MG/5ML suspension 30 mL   magnesium hydroxide (MILK OF MAGNESIA) suspension 30 mL   escitalopram (LEXAPRO) tablet 10 mg   pantoprazole (PROTONIX) EC tablet 40 mg   albuterol (VENTOLIN HFA) 108 (90 Base) MCG/ACT inhaler 2 puff   melatonin  tablet 3 mg   hydrOXYzine (ATARAX) tablet 25 mg     Recommendations  Based on my  evaluation the patient appears to have an emergency medical condition for which I recommend the patient be transferred to the emergency department for further evaluation.  [name], NP 02/22/22  5:52 AM

## 2022-02-22 DIAGNOSIS — F603 Borderline personality disorder: Secondary | ICD-10-CM

## 2022-02-22 DIAGNOSIS — Z3202 Encounter for pregnancy test, result negative: Secondary | ICD-10-CM

## 2022-02-22 DIAGNOSIS — F339 Major depressive disorder, recurrent, unspecified: Secondary | ICD-10-CM | POA: Diagnosis not present

## 2022-02-22 DIAGNOSIS — Z765 Malingerer [conscious simulation]: Secondary | ICD-10-CM

## 2022-02-22 DIAGNOSIS — R45851 Suicidal ideations: Secondary | ICD-10-CM | POA: Diagnosis not present

## 2022-02-22 LAB — POCT URINE DRUG SCREEN - MANUAL ENTRY (I-SCREEN)
POC Amphetamine UR: NOT DETECTED — NL
POC Buprenorphine (BUP): NOT DETECTED
POC Cocaine UR: NOT DETECTED
POC Marijuana UR: NOT DETECTED
POC Methadone UR: NOT DETECTED
POC Methamphetamine UR: NOT DETECTED
POC Morphine: NOT DETECTED
POC Oxazepam (BZO): NOT DETECTED
POC Oxycodone UR: NOT DETECTED
POC Secobarbital (BAR): NOT DETECTED

## 2022-02-22 LAB — POC URINE PREG, ED: Preg Test, Ur: NEGATIVE

## 2022-02-22 LAB — RESP PANEL BY RT-PCR (RSV, FLU A&B, COVID)  RVPGX2
Influenza A by PCR: NEGATIVE
Influenza B by PCR: NEGATIVE
Resp Syncytial Virus by PCR: NEGATIVE
SARS Coronavirus 2 by RT PCR: NEGATIVE

## 2022-02-22 LAB — POCT PREGNANCY, URINE: Preg Test, Ur: NEGATIVE

## 2022-02-22 MED ORDER — MELATONIN 3 MG PO TABS
3.0000 mg | ORAL_TABLET | Freq: Every day | ORAL | Status: DC
  Administered 2022-02-22: 3 mg via ORAL
  Filled 2022-02-22: qty 1

## 2022-02-22 MED ORDER — HYDROXYZINE HCL 25 MG PO TABS
25.0000 mg | ORAL_TABLET | ORAL | Status: AC
  Administered 2022-02-22: 25 mg via ORAL
  Filled 2022-02-22: qty 1

## 2022-02-22 MED ORDER — PREGABALIN 50 MG PO CAPS
100.0000 mg | ORAL_CAPSULE | Freq: Three times a day (TID) | ORAL | Status: DC
  Administered 2022-02-22: 100 mg via ORAL
  Filled 2022-02-22: qty 2

## 2022-02-22 NOTE — Discharge Instructions (Signed)
Dear [name],  Most effective treatment for your mental health disease involves BOTH a psychiatrist AND a therapist Psychiatrist to manage medications Therapist to help identify personal goals, barriers from those goals, and plan to achieve those goals by understanding emotions Please make regular appointments with an outpatient psychiatrist and other doctors once you leave the hospital (if any, otherwise, please see below for resources to make an appointment).  For therapy outside the hospital, please ask for these specific types of therapy: DBT ________________________________________________________  SAFETY CRISIS  Dial 988 for [place]    Text [phone] for Crisis Text Line:     [facility]:  [address] FIRST FLOOR.  [place], [place] [address].  [phone]  Mobile Crisis Response Teams Listed by counties in vicinity of [place]. [phone] [place] [phone] [place] [phone] [facility] [phone] [place] [phone] [place]. [phone] [place].  [place] [phone] ________________________________________________________  To see which pharmacy near you is the CHEAPEST for certain medications, please use GoodRx. It is free website and has a free phone app.    Also consider looking at [facility] $4.00 or [facility]'s $7.00 prescription list. Both are free to view if googled "[facility] $4 prescription" and "public's $7 prescription". These are set prices, no insurance required. [facility] low cost medications: $4-$15 for 30days  prescriptions or $10-$38 for 90days prescriptions  ________________________________________________________  Difficulties with sleep?   Can also use this free app for insomnia called CBT-I. Let your doctors and therapists know so they can help with extra tips and tricks or for guidance and accountability. NO ADDS on the app.     ________________________________________________________  Non-Emergent / Urgent  [facility] [address], [place], [place] [address] [phone] OUTPATIENT Walk-in information: Please note, all walk-ins are first come & first serve, with limited number of availability.  Please note that to be eligible for services you must bring: ID or a piece of mail with your name [facility] address  Therapist for therapy:  Monday & Wednesdays: Please ARRIVE at 7:15 AM for registration Will START at 8:00 AM Every 1st & 2nd Friday of the month: Please ARRIVE at 10:15 AM for registration Will START at 1 PM - 5 PM  Psychiatrist for medication management: Monday - Friday:  Please ARRIVE at 7:15 AM for registration Will START at 8:00 AM  Regretfully, due to limited availability, please be aware that you may not been seen on the same day as walk-in. Please consider making an appoint or try again. Thank you for your patience and understanding.

## 2022-02-22 NOTE — ED Provider Notes (Signed)
FBC/OBS ASAP Discharge Summary  Date and Time: 02/22/2022, 10:10 AM  Name: [name]  Age: 32 y.o.  DOB: 01/07/1991  MRN:  [id number]   Discharge Diagnoses:  Final diagnoses:  Suicidal ideation  Malingering  Recurrent major depressive disorder, remission status unspecified ([place])  Borderline personality disorder ([place])    HPI:  Per H&P: " HPI: [name], 32 y/o female with a history of schizoaffective disorder, adjustment disorder, intentional overdose, suicidal ideation.  Presented to [facility] via [facility].  Patient does have an extensive history of ER visits.  And chronic suicidal ideation.  Per the patient she is suicidal with plans to cut her throat and to cut her risk.  Patient stated she feels safe when she is here versus being at home.  According to patient she is trying to get and help her for her child.     Face-to-face observation of patient, patient is alert and oriented x 4, speech is clear, maintaining eye contact.  Patient endorsed suicidal ideation with plans to cut her throat or to cut her risk.  Patient denies alcohol use, denies smoking or illicit drug use.   Patient had recent lab work done 02/15/22.  Will not order new lab work tonight Recommend inpatient observation "  Subjective:  Patient was initially seen asleep, no acute distress, awoken easily.  Patient was pleasant and cooperative during evaluation. Safety planning completed. Patient denied access to guns or weapons. Reported chronic passive SI, that is unchanged. Denied HI/AVH, paranoia, and contracted to safety. Aware of 988, 911, and ability to return to the [facility] if needed.   Suicidal Thoughts: Yes, Passive SI Passive Intent and/or Plan: Without Intent, Without Plan Homicidal Thoughts: No Hallucinations: None Ideas of EYC:XKGY   Mood: Dysphoric Sleep:Good Appetite: Good  Review of Systems  Respiratory:  Negative for shortness of breath.   Cardiovascular:  Negative for chest pain.   Gastrointestinal:  Negative for nausea and vomiting.  Neurological:  Negative for dizziness and headaches.   Stay Summary:  [name] is a 32 y.o. female with PMH of borderline personality d/o & Schizoaffective d/o, suicide attempts, and multiple inpatient psych admissions who presented to [facility] via [facility] for SI in the setting of being home alone.  Total duration of encounter: 1 day   No behavior concerns, no agitation PRNs.  No new rx started.  Continued home rx.    Tobacco Cessation:  N/A, patient does not currently use tobacco products    Medication List    CONTINUE taking these medications    albuterol 108 (90 Base) MCG/ACT inhaler; Commonly known as: VENTOLIN HFA  budesonide-formoterol 160-4.5 MCG/ACT inhaler; Commonly known as:  SYMBICORT  cariprazine 3 MG capsule; Commonly known as: VRAYLAR; Take 1 capsule (3  mg total) by mouth daily for 14 days.  escitalopram 10 MG tablet; Commonly known as: LEXAPRO; Take 1 tablet (10  mg total) by mouth daily for 14 days.  hydrOXYzine 25 MG tablet; Commonly known as: ATARAX  metoprolol succinate 25 MG 24 hr tablet; Commonly known as: TOPROL-XL;  Take 0.5 tablets (12.5 mg total) by mouth daily for 14 days.  pantoprazole 40 MG tablet; Commonly known as: PROTONIX; Take 1 tablet  (40 mg total) by mouth daily for 14 days.  prazosin 1 MG capsule; Commonly known as: MINIPRESS; Take 1 capsule (1  mg total) by mouth at bedtime for 14 days.  pregabalin 100 MG capsule; Commonly known as: LYRICA  Voltaren 1 % Gel; Generic drug: diclofenac  Sodium       Patient has stabilized, on reassessment is not suicidal, homicidal or psychotic. Patient also has no withdrawal symptoms. Patient is being discharged from [facility] to dispo, see below.  All medication's risk, benefits, and side effects were discussed with patient prior to starting, dose changed or discontinuation.  While future psychiatric events cannot be accurately predicted, the  patient does not currently require acute inpatient psychiatric care and does not currently meet [facility] involuntary commitment criteria.  Past Psychiatric History: Per H&P 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 fragile X syndrome    Chronic constipation    Depression    Drug-seeking behavior    Essential tremor    Headache    Overdose of acetaminophen 07/2017   and other meds   Personality disorder ([place])    Schizo-affective psychosis ([place])    Schizoaffective disorder, bipolar type ([place])    Seizures ([place])    Last seizure December 2017   Sleep apnea     Past Surgical History:  Procedure Laterality Date   MOUTH SURGERY  2009 or 2010   Family History:  Family History  Problem Relation Age of Onset   Mental illness Father    Asthma Father    PDD Brother    Seizures Brother    Family Psychiatric History: Per H&P Social History:  Social History   Substance and Sexual Activity  Alcohol Use No   Alcohol/week: 1.0 standard drink of alcohol   Types: 1 Standard drinks or equivalent per week   Comment: denies at this time     Social History   Substance and Sexual Activity  Drug Use No   Comment: History of cocaine use at age 55 for 4 months    Social History   Socioeconomic History   Marital status: Widowed    Spouse name: Not on file   Number of children: 0   Years of education: Not on file   Highest education level: Not on file  Occupational History   Occupation: disability  Tobacco Use   Smoking status: Former    Packs/day: 0.00    Types: Cigarettes   Smokeless tobacco: Never   Tobacco comments:    Smoked for 2  years age [age]  Vaping Use   Vaping Use: Never used  Substance and Sexual Activity   Alcohol use: No    Alcohol/week: 1.0 standard drink of alcohol    Types: 1 Standard drinks or equivalent per week    Comment: denies at  this time   Drug use: No    Comment: History of cocaine use at age 55 for 4 months   Sexual activity: Not Currently    Birth control/protection: None  Other Topics Concern   Not on file  Social History Narrative   Marital status: Widowed      Children: daughter      Lives: with boyfriend, in two story home      Employment:  Disability      Tobacco: quit smoking; smoked for two years.      Alcohol ;none      Drugs: none   Has not traveled outside of the country.   Right handed         Social Determinants of Health   Financial Resource Strain: Not on file  Food Insecurity: No Food Insecurity (01/31/2022)   Hunger Vital Sign    Worried About Running Out of Food in the Last Year: Never true    Ran Out of Food in the Last Year: Never true  Recent Concern: Food Insecurity - Food Insecurity Present (12/28/2021)   Hunger Vital Sign    Worried About Running Out of Food in the Last Year: Often true    Ran Out of Food in the Last Year: Often true  Transportation Needs: Unmet Transportation Needs (01/31/2022)   PRAPARE - Hydrologist (Medical): No    Lack of Transportation (Non-Medical): Yes  Physical Activity: Not on file  Stress: Not on file  Social Connections: Not on file   SDOH:  [place]: No Food Insecurity (01/31/2022)  Recent Concern: [place] Present (12/28/2021)  Housing: Low Risk  (01/31/2022)  Recent Concern: Housing - High Risk (01/01/2022)  Transportation Needs: Unmet Transportation Needs (01/31/2022)  Utilities: Not At Risk (01/31/2022)  Recent Concern: Utilities - At Risk (12/28/2021)  Alcohol Screen: Low Risk  (01/31/2022)  Depression (PHQ2-9): High Risk (02/10/2022)  Tobacco Use: Medium Risk (01/31/2022)    Current Medications:  Current Facility-Administered Medications  Medication Dose Route Frequency Provider Last Rate Last Admin   albuterol (VENTOLIN HFA) 108 (90 Base) MCG/ACT  inhaler 2 puff  2 puff Inhalation Q6H PRN [name], NP       alum & mag hydroxide-simeth (MAALOX/MYLANTA) 200-200-20 MG/5ML suspension 30 mL  30 mL Oral Q4H PRN [name], NP       escitalopram (LEXAPRO) tablet 10 mg  10 mg Oral Daily [name], NP   10 mg at 02/22/22 9833   magnesium hydroxide (MILK OF MAGNESIA) suspension 30 mL  30 mL Oral Daily PRN [name], NP       melatonin tablet 3 mg  3 mg Oral QHS [name], NP   3 mg at 02/22/22 0024   pantoprazole (PROTONIX) EC tablet 40 mg  40 mg Oral Daily [name], NP   40 mg at 02/22/22 0942   pregabalin (LYRICA) capsule 100 mg  100 mg Oral TID [name], NP   100 mg at 02/22/22 8250   Current Outpatient Medications  Medication Sig Dispense Refill   albuterol (VENTOLIN HFA) 108 (90 Base) MCG/ACT inhaler Inhale 2 puffs into the lungs every 6 (six) hours as needed for wheezing or shortness of breath.     budesonide-formoterol (SYMBICORT) 160-4.5 MCG/ACT inhaler Inhale 2 puffs into the lungs 2 (two) times daily.     cariprazine (VRAYLAR) 3 MG capsule Take 1 capsule (3 mg total) by mouth daily for 14 days. 14 capsule 0   diclofenac Sodium (VOLTAREN) 1 % GEL Apply 2 g topically 4 (four) times daily as needed (For pain).     escitalopram (LEXAPRO) 10 MG tablet Take 1 tablet (10 mg total) by mouth daily for 14 days. 14 tablet 0   hydrOXYzine (ATARAX) 25 MG tablet Take 25 mg by mouth 3 (three) times daily as needed for anxiety.     metoprolol succinate (TOPROL-XL) 25 MG 24 hr tablet Take 0.5 tablets (12.5 mg total) by mouth daily for 14 days. 7 tablet 0   pantoprazole (PROTONIX) 40 MG tablet Take 1 tablet (40 mg total) by mouth daily for 14 days. 14 tablet 0   prazosin (MINIPRESS) 1 MG capsule Take 1 capsule (1 mg total) by mouth at bedtime for 14 days.  14 capsule 0   pregabalin (LYRICA) 100 MG capsule Take 100 mg by mouth 3 (three) times daily.      PTA Medications: (Not in a hospital admission)     02/10/2022    1:49 AM  06/27/2021    3:18 AM 05/01/2021    2:16 AM  Depression screen PHQ 2/9  Decreased Interest '2 1 2  '$ Down, Depressed, Hopeless '2 2 3  '$ PHQ - 2 Score '4 3 5  '$ Altered sleeping 2 0 0  Tired, decreased energy '2 1 1  '$ Change in appetite 2 0 1  Feeling bad or failure about yourself  '2 1 3  '$ Trouble concentrating '2 2 3  '$ Moving slowly or fidgety/restless '1 1 1  '$ Suicidal thoughts '1 1 3  '$ PHQ-9 Score '16 9 17  '$ Difficult doing work/chores Very difficult Somewhat difficult Extremely dIfficult    Flowsheet Row ED from 02/21/2022 in [facility] ED from 02/15/2022 in [facility] ED from 02/10/2022 in [facility] CATEGORY High Risk Error: Question 6 not populated High Risk       Musculoskeletal  Strength & Muscle Tone: within normal limits Gait & Station: normal Patient leans: N/A   Psychiatric Specialty Exam   Presentation General Appearance:Appropriate for Environment, Casual, Fairly Groomed Eye Contact:Fair Speech:Clear and Coherent, Normal Rate Volume:Decreased Handedness:Right  Mood and Affect  Mood:Dysphoric Affect:Appropriate, Congruent, Full Range  Thought Process  Thought Process:Coherent, Goal Directed, Linear Descriptions of Associations:Intact  Thought Content Suicidal Thoughts:Suicidal Thoughts: Yes, Passive SI Passive Intent and/or Plan: Without Intent, Without Plan Homicidal Thoughts:Homicidal Thoughts: No Hallucinations:Hallucinations: None Ideas of Reference:None Thought Content:Rumination, Logical  Sensorium Memory:Immediate Fair Judgment:Fair Insight:Fair  Executive Functions  Orientation:Full (Time, Place and Person) Language:Good Concentration:Good Advance of Knowledge:Good  Psychomotor Activity  Psychomotor Activity:Psychomotor Activity: Normal  Assets Assets:Communication Skills, Desire for Improvement,  Housing  Sleep Quality:Good  Physical Exam  BP (!) 102/49 (BP Location: Right Arm)   Pulse 74   Temp 98.5 F (36.9 C) (Oral)   Resp 16   LMP 02/08/2022   SpO2 97%   Physical Exam Vitals and nursing note reviewed.  Constitutional:      General: She is awake. She is not in acute distress.    Appearance: She is not ill-appearing or diaphoretic.  HENT:     Head: Normocephalic.  Pulmonary:     Effort: Pulmonary effort is normal. No respiratory distress.  Neurological:     Mental Status: She is alert.     Demographic Factors:  Divorced or widowed and Caucasian  Loss Factors: NA  Historical Factors: Prior suicide attempts and Impulsivity  Risk Reduction Factors:   Sense of responsibility to family, Living with another person, especially a relative, and Positive social support  Continued Clinical Symptoms:  Personality Disorders:   Cluster B Previous Psychiatric Diagnoses and Treatments  Cognitive Features That Contribute To Risk:  Loss of executive function and Polarized thinking    Suicide Risk:  Mild:  Suicidal ideation of limited frequency, intensity, duration, and specificity.  There are no identifiable plans, no associated intent, mild dysphoria and related symptoms, good self-control (both objective and subjective assessment), few other risk factors, and identifiable protective factors, including available and accessible social support.  Plan Of Care/Follow-up recommendations:  Activity and diet at tolerated.  Please: Take all medications as prescribed by your mental healthcare provider. Report any adverse effects and or reactions from the medicines to your  outpatient provider promptly. Do not engage in alcohol and or illegal drug use while on prescription medicines.  Disposition: Home    Total Time spent with patient: 20 minutes  Signed: Merrily Brittle, DO Psychiatry Resident, PGY-2 [facility] 02/22/2022, 10:10 AM

## 2022-02-22 NOTE — ED Notes (Signed)
Pt A&O x 4, presents with suicidal ideations, plan to slit throat.  Pt reports she feels safer here in hospital.  Denies HI or AVH.  Comfort measures given, monitoring for safety.
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
## 2022-02-22 NOTE — ED Notes (Signed)
Patient is lying in bed scratching her arms aeb MHT observing patient cause skin to become irritated. Patient was redirected aeb RN conversing with patient. Patient responds properly aeb being open about her negative thoughts. Patient remains safe on unit with continued monitoring.

## 2022-02-22 NOTE — ED Notes (Signed)
Patient A&O x 4, ambulatory. Patient discharged in no acute distress. Patient denied SI/HI, A/VH upon discharge. Patient verbalized understanding of all discharge instructions explained by staff, to include follow up appointments, RX's and safety plan. Patient reported mood 10/10.  Pt belongings returned to patient from locker #  3 intact. Patient escorted to lobby via staff for transport to destination. Safety maintained.

## 2022-02-22 NOTE — ED Notes (Signed)
Patient resting quietly in bed with eyes closed. Respirations equal and unlabored, skin warm and dry, NAD. Routine safety checks conducted according to facility protocol. Will continue to monitor for safety.  

## 2022-02-22 NOTE — ED Notes (Signed)
Pt sleeping@this time. Breathing even and unlabored. Will continue to monitor for safety 

## 2022-02-22 NOTE — ED Notes (Signed)
Patient A&Ox4. Patient denies SI/HI and AVH. Patient denies any physical complaints when asked. No acute distress noted. Support and encouragement provided. Routine safety checks conducted according to facility protocol. Encouraged patient to notify staff if thoughts of harm toward self or others arise. Patient verbalize understanding and agreement. Will continue to monitor for safety.    

## 2022-02-23 ENCOUNTER — Other Ambulatory Visit: Payer: Self-pay

## 2022-02-23 ENCOUNTER — Emergency Department (HOSPITAL_COMMUNITY): Payer: PPO

## 2022-02-23 ENCOUNTER — Encounter (HOSPITAL_COMMUNITY): Payer: Self-pay

## 2022-02-23 ENCOUNTER — Emergency Department (HOSPITAL_COMMUNITY)
Admission: EM | Admit: 2022-02-23 | Discharge: 2022-02-23 | Disposition: A | Discharge status: Home / Self Care | Payer: PPO | Attending: Emergency Medicine | Admitting: Emergency Medicine

## 2022-02-23 DIAGNOSIS — Z87891 Personal history of nicotine dependence: Secondary | ICD-10-CM | POA: Insufficient documentation

## 2022-02-23 DIAGNOSIS — R000 Tachycardia, unspecified: Secondary | ICD-10-CM | POA: Diagnosis not present

## 2022-02-23 DIAGNOSIS — R112 Nausea with vomiting, unspecified: Secondary | ICD-10-CM | POA: Diagnosis not present

## 2022-02-23 DIAGNOSIS — R1013 Epigastric pain: Secondary | ICD-10-CM | POA: Diagnosis present

## 2022-02-23 LAB — I-STAT BETA HCG BLOOD, ED (MC, WL, AP ONLY): I-stat hCG, quantitative: <5 m[IU]/mL (ref <5)

## 2022-02-23 LAB — COMPREHENSIVE METABOLIC PANEL
ALT: 15 U/L (ref 0–44)
AST: 15 U/L (ref 15–41)
Albumin: 3.7 g/dL (ref 3.5–5.0)
Alkaline Phosphatase: 68 U/L (ref 38–126)
Anion gap: 9 (ref 5–15)
BUN: 9 mg/dL (ref 6–20)
CO2: 21 mmol/L — ABNORMAL LOW (ref 22–32)
Calcium: 8.4 mg/dL — ABNORMAL LOW (ref 8.9–10.3)
Chloride: 107 mmol/L (ref 98–111)
Creatinine, Ser: 0.71 mg/dL (ref 0.44–1.00)
GFR, Estimated: >60 mL/min (ref >60)
Glucose, Bld: 104 mg/dL — ABNORMAL HIGH (ref 70–99)
Potassium: 3.8 mmol/L (ref 3.5–5.1)
Sodium: 137 mmol/L (ref 135–145)
Total Bilirubin: 0.6 mg/dL (ref 0.3–1.2)
Total Protein: 6.8 g/dL (ref 6.5–8.1)

## 2022-02-23 LAB — CBC
HCT: 40.4 % (ref 36.0–46.0)
Hemoglobin: 12.3 g/dL (ref 12.0–15.0)
MCH: 25.2 pg — ABNORMAL LOW (ref 26.0–34.0)
MCHC: 30.4 g/dL (ref 30.0–36.0)
MCV: 82.8 fL (ref 80.0–100.0)
Platelets: 213 10*3/uL (ref 150–400)
RBC: 4.88 MIL/uL (ref 3.87–5.11)
RDW: 14.1 % (ref 11.5–15.5)
WBC: 7.7 10*3/uL (ref 4.0–10.5)
nRBC: 0 % (ref 0.0–0.2)

## 2022-02-23 LAB — LIPASE, BLOOD: Lipase: 32 U/L (ref 11–51)

## 2022-02-23 MED ORDER — PANTOPRAZOLE SODIUM 40 MG IV SOLR
40.0000 mg | Freq: Once | INTRAVENOUS | Status: AC
  Administered 2022-02-23: 40 mg via INTRAVENOUS
  Filled 2022-02-23: qty 10

## 2022-02-23 MED ORDER — SODIUM CHLORIDE 0.9 % IV SOLN: INTRAVENOUS | Status: DC

## 2022-02-23 MED ORDER — HYDROMORPHONE HCL 1 MG/ML IJ SOLN
1.0000 mg | Freq: Once | INTRAMUSCULAR | Status: AC
  Administered 2022-02-23: 1 mg via INTRAVENOUS
  Filled 2022-02-23: qty 1

## 2022-02-23 MED ORDER — IOHEXOL 350 MG/ML SOLN
75.0000 mL | Freq: Once | INTRAVENOUS | Status: AC | PRN
  Administered 2022-02-23: 75 mL via INTRAVENOUS

## 2022-02-23 MED ORDER — ONDANSETRON HCL 4 MG/2ML IJ SOLN
4.0000 mg | Freq: Once | INTRAMUSCULAR | Status: AC
  Administered 2022-02-23: 4 mg via INTRAVENOUS
  Filled 2022-02-23: qty 2

## 2022-02-23 NOTE — ED Notes (Signed)
CT contacted by RN for update on pt pending scan.

## 2022-02-23 NOTE — ED Triage Notes (Signed)
Pt came in POV d/t abd pain in the upper abd that radiates to both sides for the past week, with severe n/v no OTC meds help. Hx of pancreatitis, rate pain 9/10 & describes as stabbing, A/Ox4.

## 2022-02-23 NOTE — ED Notes (Signed)
Patient transported to CT 

## 2022-02-23 NOTE — Discharge Instructions (Signed)
Workup without any acute findings or labs consistent with pancreatitis or acute abdominal process.  Continue to take your Protonix and continue to take the Zofran.  Return for vomiting up pools of red blood 2 or 3 times in a day or for red blood in bowel movement 2 or 3 times in a day.  Follow-up with your [facility].

## 2022-02-23 NOTE — ED Provider Notes (Signed)
[facility] Provider Note   CSN: [id number] Arrival date & time: 02/23/22  4098     History  Chief Complaint  Patient presents with   Abdominal Pain   Nausea   Emesis    [name] is a 32 y.o. female.  Patient presenting with a complaint of epigastric abdominal pain with nausea vomiting concerned about having acute pancreatitis again.  Patient states that the vomit has been dark but no bright red blood.  Patient is already on Protonix.  No blood in her bowel movements.  Vital signs on arrival temp 98.3 pulse 108 respirations 20 blood pressure 108/80.  Patient states that she has had the epigastric abdominal pain nausea vomiting problem for a week.  Patient was just discharged from behavioral health urgent care was seen there for suicidal thoughts but cleared and deemed not needing behavioral health admission.  Patient denies any suicidal thoughts now.  Patient is seen frequently by behavioral health.  Patient did not let behavioral health know about the abdominal pain.  Past medical history significant for schizoaffective disorder and bipolar type history of chronic constipation history of drug-seeking behavior history of seizures with last seizure was in December 2017.  Overdose on Tylenol June 2019.  Patient is a former cigarette smoker.       Home Medications Prior to Admission medications   Medication Sig Start Date End Date Taking? Authorizing Provider  albuterol (VENTOLIN HFA) 108 (90 Base) MCG/ACT inhaler Inhale 2 puffs into the lungs every 6 (six) hours as needed for wheezing or shortness of breath.    [provider]  budesonide-formoterol (SYMBICORT) 160-4.5 MCG/ACT inhaler Inhale 2 puffs into the lungs 2 (two) times daily.    [provider]  diclofenac Sodium (VOLTAREN) 1 % GEL Apply 2 g topically 4 (four) times daily as needed (For pain).    [provider]  escitalopram (LEXAPRO) 10  MG tablet Take 1 tablet (10 mg total) by mouth daily for 14 days. 02/07/22 02/22/22  Massengill, Ovid Curd, MD  hydrOXYzine (ATARAX) 25 MG tablet Take 25 mg by mouth 3 (three) times daily as needed for anxiety.    [provider]  metoprolol succinate (TOPROL-XL) 25 MG 24 hr tablet Take 0.5 tablets (12.5 mg total) by mouth daily for 14 days. 02/06/22 02/22/22  Massengill, Ovid Curd, MD  pantoprazole (PROTONIX) 40 MG tablet Take 1 tablet (40 mg total) by mouth daily for 14 days. 02/07/22 02/22/22  Massengill, Ovid Curd, MD  prazosin (MINIPRESS) 1 MG capsule Take 1 capsule (1 mg total) by mouth at bedtime for 14 days. 02/06/22 02/22/22  Massengill, Ovid Curd, MD  pregabalin (LYRICA) 100 MG capsule Take 100 mg by mouth 3 (three) times daily.    [provider]      Allergies    Bee venom, Coconut flavor, Fish allergy, Geodon [ziprasidone hcl], Haloperidol and related, Lithobid [lithium], Roxicodone [oxycodone], Seroquel [quetiapine], Shellfish allergy, Phenergan [promethazine hcl], Prilosec [omeprazole], Sulfa antibiotics, Tegretol [carbamazepine], Prozac [fluoxetine], Tape, and Tylenol [acetaminophen]    Review of Systems   Review of Systems  Constitutional:  Negative for chills and fever.  HENT:  Negative for ear pain and sore throat.   Eyes:  Negative for pain and visual disturbance.  Respiratory:  Negative for cough and shortness of breath.   Cardiovascular:  Negative for chest pain and palpitations.  Gastrointestinal:  Positive for abdominal pain, nausea and vomiting.  Genitourinary:  Negative for dysuria and hematuria.  Musculoskeletal:  Negative  for arthralgias and back pain.  Skin:  Negative for color change and rash.  Neurological:  Negative for seizures and syncope.  All other systems reviewed and are negative.   Physical Exam Updated Vital Signs BP 123/68   Pulse 82   Temp 98.2 F (36.8 C) (Oral)   Resp (!) 21   LMP 02/08/2022   SpO2 98%  Physical Exam Vitals and nursing note  reviewed.  Constitutional:      General: She is not in acute distress.    Appearance: She is well-developed. She is not ill-appearing.  HENT:     Head: Normocephalic and atraumatic.  Eyes:     Conjunctiva/sclera: Conjunctivae normal.  Cardiovascular:     Rate and Rhythm: Regular rhythm. Tachycardia present.     Heart sounds: No murmur heard. Pulmonary:     Effort: Pulmonary effort is normal. No respiratory distress.     Breath sounds: Normal breath sounds.  Abdominal:     General: There is no distension.     Palpations: Abdomen is soft.     Tenderness: There is no abdominal tenderness. There is no guarding.     Hernia: No hernia is present.  Musculoskeletal:        General: No swelling.     Cervical back: Neck supple.  Skin:    General: Skin is warm and dry.     Capillary Refill: Capillary refill takes less than 2 seconds.  Neurological:     General: No focal deficit present.     Mental Status: She is alert and oriented to person, place, and time.  Psychiatric:        Mood and Affect: Mood normal.     ED Results / Procedures / Treatments   Labs (all labs ordered are listed, but only abnormal results are displayed) Labs Reviewed  COMPREHENSIVE METABOLIC PANEL - Abnormal; Notable for the following components:      Result Value   CO2 21 (*)    Glucose, Bld 104 (*)    Calcium 8.4 (*)    All other components within normal limits  CBC - Abnormal; Notable for the following components:   MCH 25.2 (*)    All other components within normal limits  LIPASE, BLOOD  URINALYSIS, ROUTINE W REFLEX MICROSCOPIC  I-STAT BETA HCG BLOOD, ED ([facility], [facility], [facility] ONLY)    EKG EKG Interpretation  Date/Time:  Friday February 23 2022 12:03:14 EST Ventricular Rate:  89 PR Interval:  140 QRS Duration: 89 QT Interval:  369 QTC Calculation: 449 R Axis:   27 Text Interpretation: Sinus rhythm Inferior infarct, old Lateral leads are also involved Confirmed by Fredia Sorrow [phone]) on 02/23/2022  1:53:36 PM  Radiology No results found.  Procedures Procedures    Medications Ordered in ED Medications  0.9 %  sodium chloride infusion ( Intravenous New Bag/Given 02/23/22 1213)  ondansetron (ZOFRAN) injection 4 mg (4 mg Intravenous Given 02/23/22 1208)  HYDROmorphone (DILAUDID) injection 1 mg (1 mg Intravenous Given 02/23/22 1208)  pantoprazole (PROTONIX) injection 40 mg (40 mg Intravenous Given 02/23/22 1209)    ED Course/ Medical Decision Making/ A&P                             Medical Decision Making Amount and/or Complexity of Data Reviewed Radiology: ordered.  Risk Prescription drug management.   Patient nontoxic no acute distress.  Other than a little bit of tachycardia.  Beta-hCG negative no concerns for  pregnancy.  Lipase normal at 32 making severe pancreatitis unlikely.  Complete metabolic panel CO2 is 21 glucose 104 GFR greater than 60 LFTs are normal.  To include total bili and alk phos.  CBC white blood cell count 7.7 hemoglobin very good at 12.3 platelets good at 213.  EKG was a little bit of subtle changes inferiorly but nothing acute.  Patient does not really have any anterior chest pain.  Will check troponin.  Patient is awaiting CT abdomen pelvis for further evaluation.  But clinically appears very stable.  Clinically does not seem to have any evidence here of any significant upper GI bleed.  Patient given IV Protonix here.  Along with Zofran and some pain medicine for the abdominal pain.  Patient's labs without any significant abnormalities.  CT abdomen and pelvis without any acute findings.  No evidence of any pancreatitis or any other abnormalities.  No gallbladder abnormalities.  Possibly could be a gastritis.  Patient without any vomiting here.  Hemoglobin is very stable.  Patient has Protonix to take at home and has Zofran to take at home.   Final Clinical Impression(s) / ED Diagnoses Final diagnoses:  Epigastric pain    Rx / DC Orders ED Discharge  Orders     None         Fredia Sorrow, MD 02/23/22 1517

## 2022-02-23 NOTE — ED Provider Triage Note (Signed)
Emergency Medicine Provider Triage Evaluation Note  [name] , a 32 y.o. female  was evaluated in triage.  Pt complains of upper abdominal pain x 1 week. Vomiting twice daily. Hx of pancreatitis. Pain feels like stabbing and radiates to her back. Taking zofran at home without relief.   Review of Systems  Positive: Abd pain, nausea, vomiting Negative: Fever, chills, diarrhea, constipation, urinary sx  Physical Exam  BP 108/80 (BP Location: Right Arm)   Pulse (!) 108   Temp 98.3 F (36.8 C) (Oral)   Resp 20   LMP 02/08/2022   SpO2 97%  Gen:   Awake, no distress   Resp:  Normal effort  MSK:   Moves extremities without difficulty  Other:    Medical Decision Making  Medically screening exam initiated at 10:14 AM.  Appropriate orders placed.  [name] was informed that the remainder of the evaluation will be completed by another provider, this initial triage assessment does not replace that evaluation, and the importance of remaining in the ED until their evaluation is complete.  Workup initiated   Gurley Climer T, PA-C 02/23/22 1016

## 2022-02-28 ENCOUNTER — Other Ambulatory Visit: Payer: Self-pay | Admitting: Obstetrics and Gynecology

## 2022-02-28 ENCOUNTER — Telehealth: Payer: Self-pay

## 2022-02-28 DIAGNOSIS — N83209 Unspecified ovarian cyst, unspecified side: Secondary | ICD-10-CM

## 2022-02-28 NOTE — Telephone Encounter (Signed)
     Patient  visit on 02/23/2022  at [facility] [place] was for Epigastric pain.  Have you been able to follow up with your primary care physician? No. Patient will follow up if needed.  The patient was or was not able to obtain any needed medicine or equipment.No medication prescribed.  Are there diet recommendations that you are having difficulty following? No  Patient expresses understanding of discharge instructions and education provided has no other needs at this time.    [facility] Resource Care Guide   [email or web address]  ?? [phone]   Website: [email or web address]  [email or web address]

## 2022-03-01 IMAGING — US US ABDOMEN LIMITED
1 series · 14 of 25 positions shown · non-contrast
Comparison: 06/16/2019

CLINICAL DATA: Epigastric pain for 1 week

EXAM:
ULTRASOUND ABDOMEN LIMITED RIGHT UPPER QUADRANT

[Series 1: us abdomen limited · 14 of 48 slices shown]
[im 1/48]
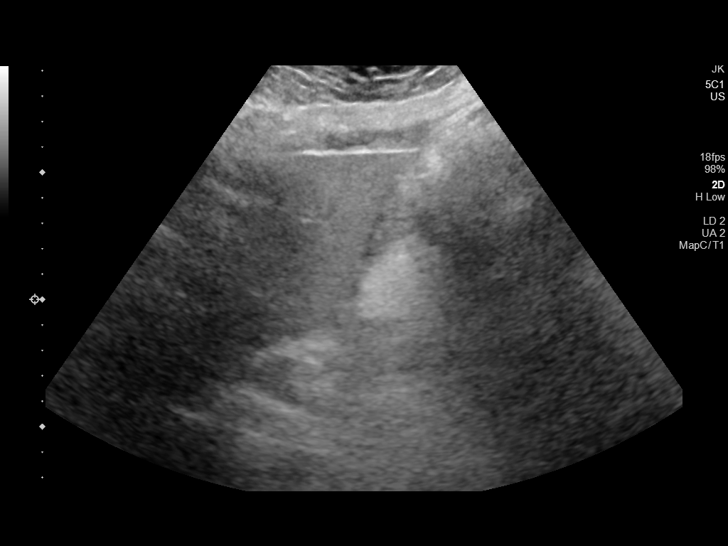
[im 4/48]
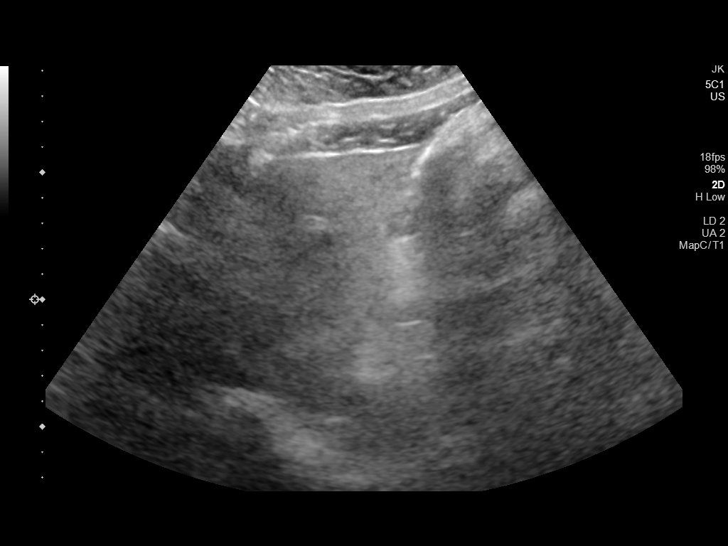
[im 8/48]
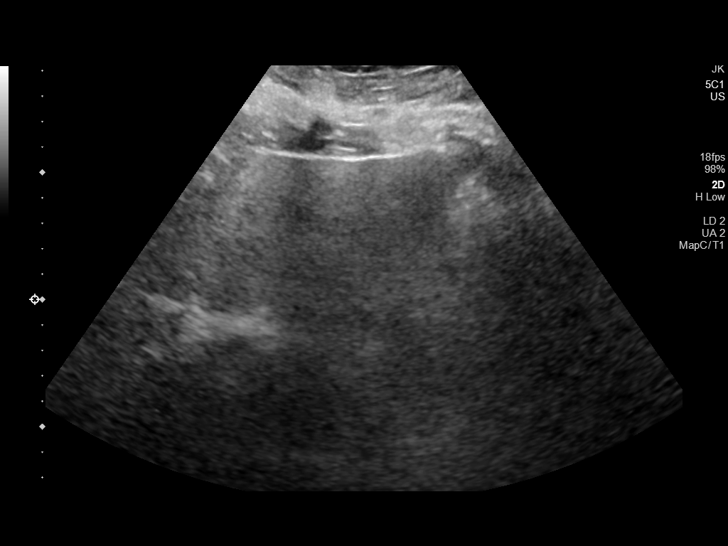
[im 12/48]
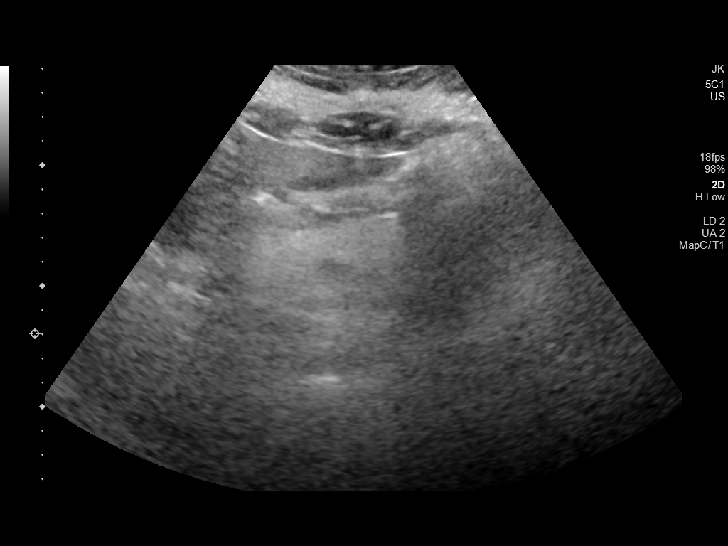
[im 16/48]
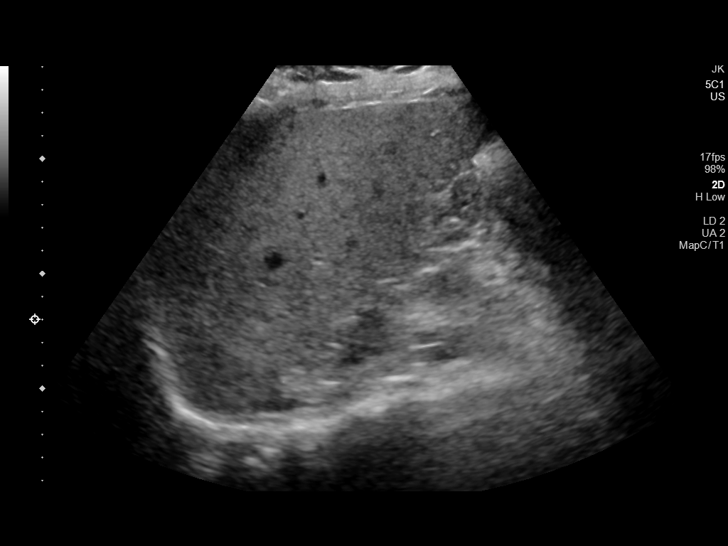
[im 18/48]
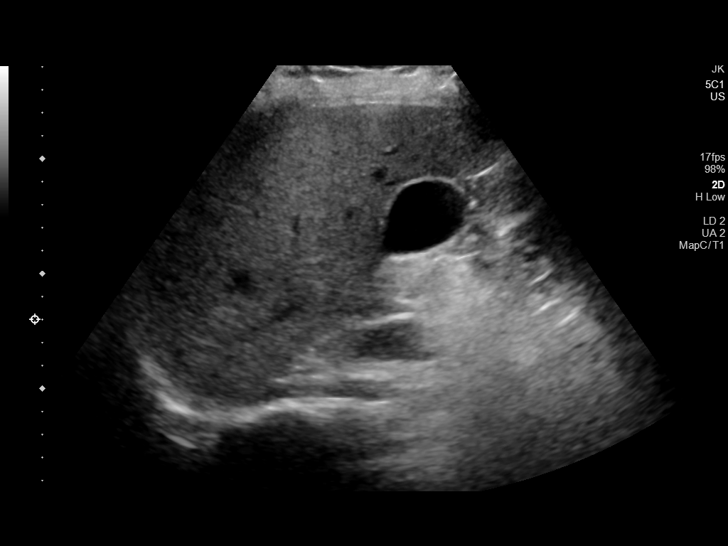
[im 22/48]
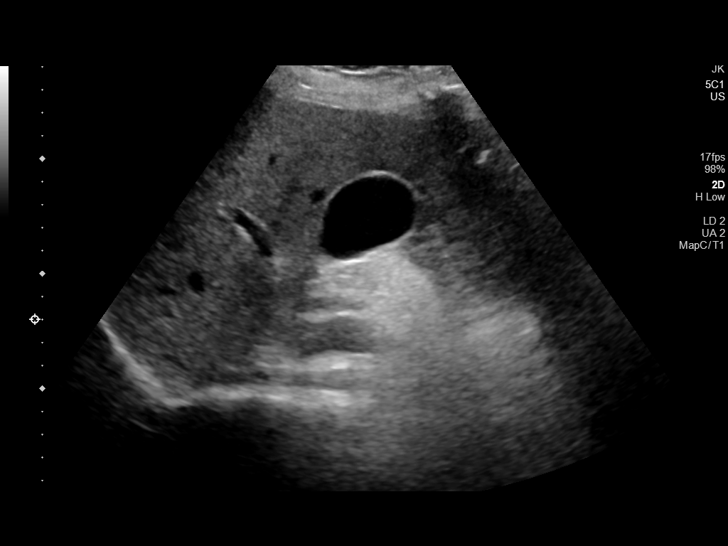
[im 26/48]
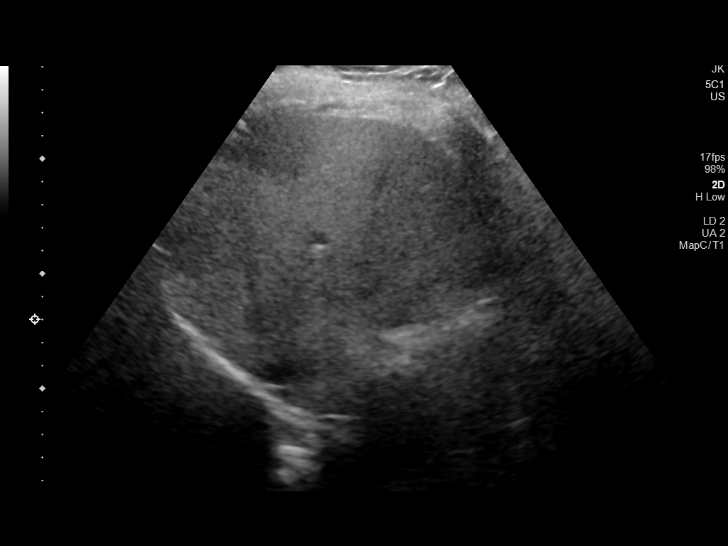
[im 30/48]
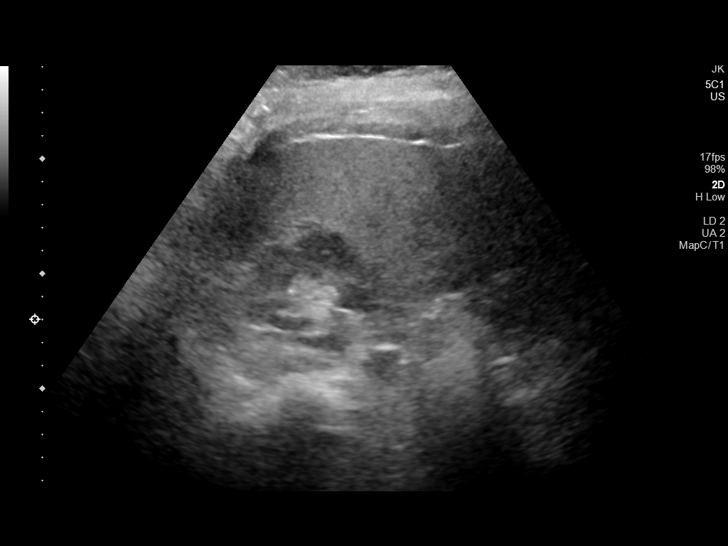
[im 32/48]
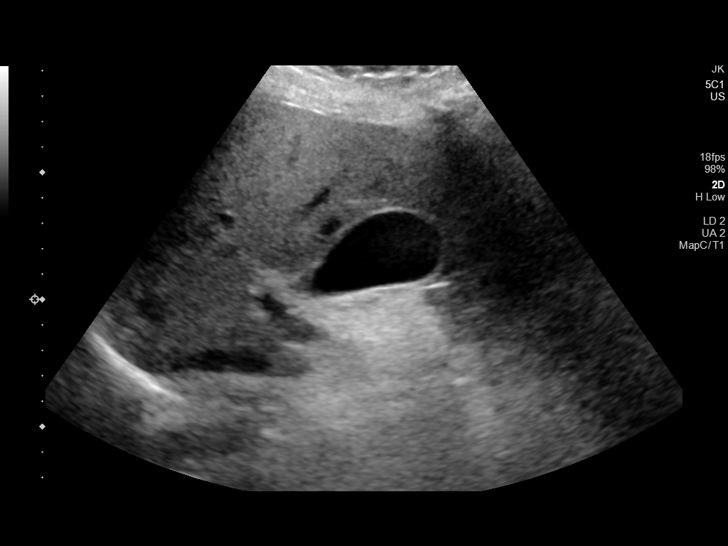
[im 36/48]
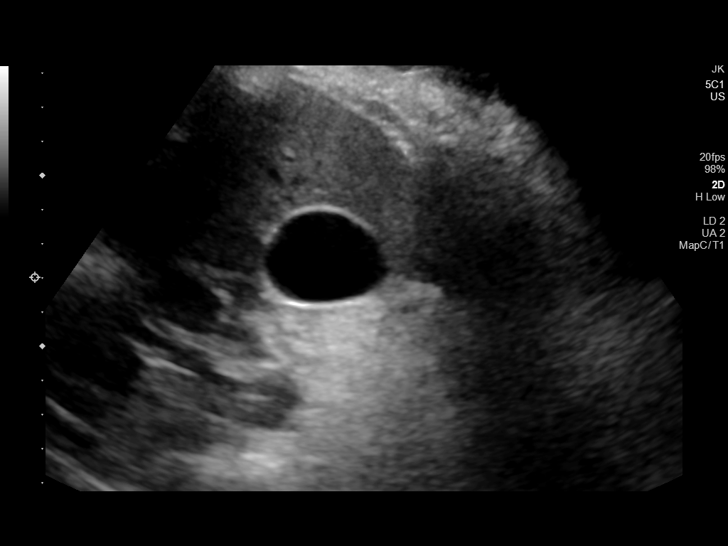
[im 40/48]
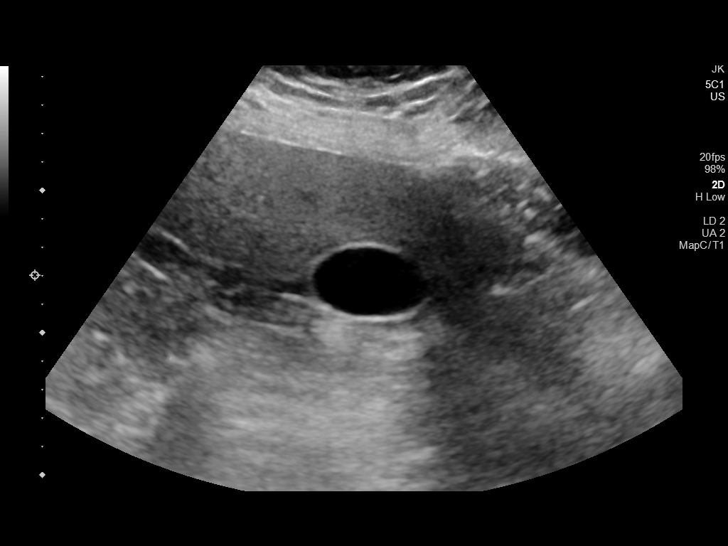
[im 44/48]
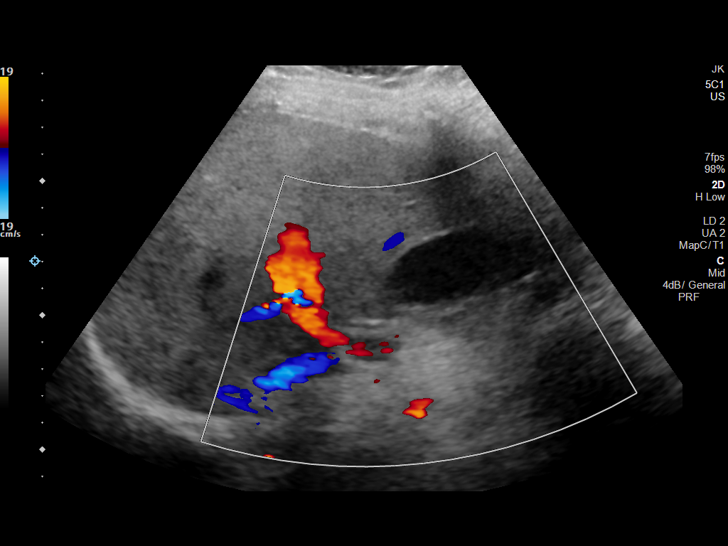
[im 48/48]
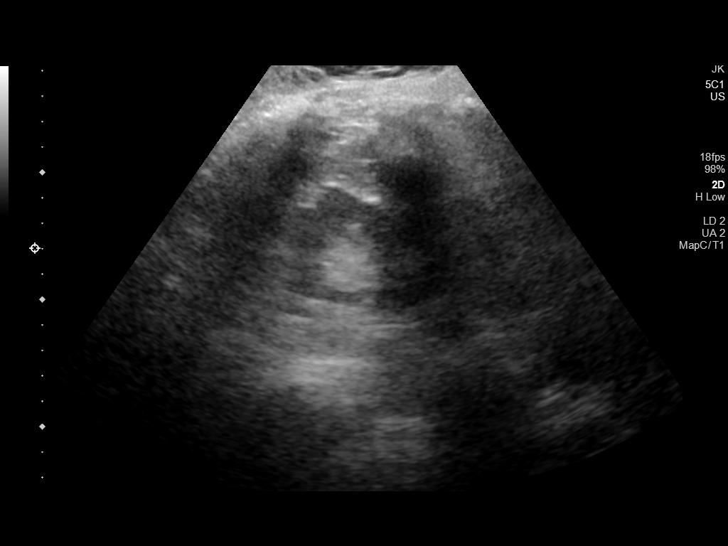

[14 of 25 positions shown; findings below may reference images not displayed]

FINDINGS: Gallbladder:

No gallstones or wall thickening visualized. No sonographic Murphy
sign noted by sonographer.

Common bile duct:

Diameter: 2 mm

Liver:

Liver echotexture is increased consistent with hepatic steatosis. No
focal liver abnormality. Portal vein is patent on color Doppler
imaging with normal direction of blood flow towards the liver.

Other: None.
IMPRESSION: 1. Stable hepatic steatosis.
2. Otherwise unremarkable right upper quadrant ultrasound.

## 2022-03-02 ENCOUNTER — Emergency Department (HOSPITAL_COMMUNITY)
Admission: EM | Admit: 2022-03-02 | Discharge: 2022-03-02 | Disposition: A | Discharge status: Home / Self Care | Payer: PPO | Attending: Emergency Medicine | Admitting: Emergency Medicine

## 2022-03-02 ENCOUNTER — Emergency Department (HOSPITAL_COMMUNITY): Payer: PPO

## 2022-03-02 ENCOUNTER — Encounter (HOSPITAL_COMMUNITY): Payer: Self-pay | Admitting: Emergency Medicine

## 2022-03-02 ENCOUNTER — Other Ambulatory Visit: Payer: Self-pay

## 2022-03-02 DIAGNOSIS — R059 Cough, unspecified: Secondary | ICD-10-CM | POA: Diagnosis present

## 2022-03-02 DIAGNOSIS — J101 Influenza due to other identified influenza virus with other respiratory manifestations: Secondary | ICD-10-CM | POA: Diagnosis not present

## 2022-03-02 DIAGNOSIS — Z20822 Contact with and (suspected) exposure to covid-19: Secondary | ICD-10-CM | POA: Diagnosis not present

## 2022-03-02 LAB — RESP PANEL BY RT-PCR (RSV, FLU A&B, COVID)  RVPGX2
Influenza A by PCR: POSITIVE — AB
Influenza B by PCR: NEGATIVE
Resp Syncytial Virus by PCR: NEGATIVE
SARS Coronavirus 2 by RT PCR: NEGATIVE

## 2022-03-02 MED ORDER — ALBUTEROL SULFATE HFA 108 (90 BASE) MCG/ACT IN AERS: 2.0000 | INHALATION_SPRAY | RESPIRATORY_TRACT | Status: DC | PRN

## 2022-03-02 NOTE — ED Triage Notes (Addendum)
Presents via EMS. Dry cough x2 days. Recent family member with URI. Chest wall is tender to palpation, with deep breath, and with cough. Has tried home albuterol inhaler without relief   PNU 6 weeks ago, was treated with abx.   Denies fever  Endorse SOB  EMS exam: Lungs clear per EMS, CBG 108, 97% RA

## 2022-03-02 NOTE — ED Provider Notes (Signed)
[facility] Provider Note   CSN: [id number] Arrival date & time: 03/02/22  0546     History  Chief Complaint  Patient presents with   Cough    [name] is a 32 y.o. female who presents to the ED with concerns for cough onset 2 days. Has sick contacts at home. Has associated chest wall pain. Tried albuterol inhaler without relief. Denies rhinorrhea, nasal congestion.   The history is provided by the patient. No language interpreter was used.       Home Medications Prior to Admission medications   Medication Sig Start Date End Date Taking? Authorizing Provider  albuterol (VENTOLIN HFA) 108 (90 Base) MCG/ACT inhaler Inhale 2 puffs into the lungs every 6 (six) hours as needed for wheezing or shortness of breath.    [provider]  budesonide-formoterol (SYMBICORT) 160-4.5 MCG/ACT inhaler Inhale 2 puffs into the lungs 2 (two) times daily.    [provider]  diclofenac Sodium (VOLTAREN) 1 % GEL Apply 2 g topically 4 (four) times daily as needed (For pain).    [provider]  escitalopram (LEXAPRO) 10 MG tablet Take 1 tablet (10 mg total) by mouth daily for 14 days. 02/07/22 02/22/22  Massengill, Ovid Curd, MD  hydrOXYzine (ATARAX) 25 MG tablet Take 25 mg by mouth 3 (three) times daily as needed for anxiety.    [provider]  metoprolol succinate (TOPROL-XL) 25 MG 24 hr tablet Take 0.5 tablets (12.5 mg total) by mouth daily for 14 days. 02/06/22 02/22/22  Massengill, Ovid Curd, MD  pantoprazole (PROTONIX) 40 MG tablet Take 1 tablet (40 mg total) by mouth daily for 14 days. 02/07/22 02/22/22  Massengill, Ovid Curd, MD  prazosin (MINIPRESS) 1 MG capsule Take 1 capsule (1 mg total) by mouth at bedtime for 14 days. 02/06/22 02/22/22  Massengill, Ovid Curd, MD  pregabalin (LYRICA) 100 MG capsule Take 100 mg by mouth 3 (three) times daily.    [provider]      Allergies    Bee venom, Coconut flavor, Fish  allergy, Geodon [ziprasidone hcl], Haloperidol and related, Lithobid [lithium], Roxicodone [oxycodone], Seroquel [quetiapine], Shellfish allergy, Phenergan [promethazine hcl], Prilosec [omeprazole], Sulfa antibiotics, Tegretol [carbamazepine], Prozac [fluoxetine], Tape, and Tylenol [acetaminophen]    Review of Systems   Review of Systems  Respiratory:  Positive for cough.   All other systems reviewed and are negative.   Physical Exam Updated Vital Signs BP 130/86   Pulse 99   Temp 98.7 F (37.1 C) (Oral)   Resp 17   Wt 92 kg   LMP 02/08/2022   SpO2 100%   BMI 34.81 kg/m  Physical Exam Vitals and nursing note reviewed.  Constitutional:      General: She is not in acute distress.    Appearance: Normal appearance.  Eyes:     General: No scleral icterus.    Extraocular Movements: Extraocular movements intact.  Cardiovascular:     Rate and Rhythm: Normal rate and regular rhythm.     Pulses: Normal pulses.     Heart sounds: Normal heart sounds.  Pulmonary:     Effort: Pulmonary effort is normal. No respiratory distress.     Breath sounds: Normal breath sounds.  Abdominal:     Palpations: Abdomen is soft. There is no mass.     Tenderness: There is no abdominal tenderness.  Musculoskeletal:        General: Normal range of motion.     Cervical back: Neck supple.  Skin:    General: Skin is warm and dry.     Findings: No rash.  Neurological:     Mental Status: She is alert.     Sensory: Sensation is intact.     Motor: Motor function is intact.  Psychiatric:        Behavior: Behavior normal.     ED Results / Procedures / Treatments   Labs (all labs ordered are listed, but only abnormal results are displayed) Labs Reviewed  RESP PANEL BY RT-PCR (RSV, FLU A&B, COVID)  RVPGX2 - Abnormal; Notable for the following components:      Result Value   Influenza A by PCR POSITIVE (*)    All other components within normal limits    EKG EKG Interpretation  Date/Time:  Friday  March 02 2022 06:16:47 EST Ventricular Rate:  104 PR Interval:  134 QRS Duration: 90 QT Interval:  343 QTC Calculation: 452 R Axis:   61 Text Interpretation: Sinus tachycardia Borderline T abnormalities, inferior leads no stemi similar to prior (TWI lead 3 noted on prior) Confirmed by Wynona Dove ([id number]) on 03/02/2022 9:55:31 AM  Radiology DG Chest 2 View  Result Date: 03/02/2022 CLINICAL DATA:  32 year old female with chest pain shortness of breath and cough. EXAM: CHEST - 2 VIEW COMPARISON:  Chest CTA 01/22/2022 and earlier. FINDINGS: PA and lateral views 0629 hours. Lung volumes and mediastinal contours are stable and within normal limits. Visualized tracheal air column is within normal limits. Both lungs appear clear. No pneumothorax or pleural effusion. No osseous abnormality identified.  Negative visible bowel gas. IMPRESSION: Negative. No cardiopulmonary abnormality. Electronically Signed   By: Genevie Ann M.D.   On: 03/02/2022 06:36    Procedures Procedures   Medications Ordered in ED Medications  albuterol (VENTOLIN HFA) 108 (90 Base) MCG/ACT inhaler 2 puff (has no administration in time range)    ED Course/ Medical Decision Making/ A&P Clinical Course as of 03/02/22 1507  Fri Mar 02, 2022  8250 Influenza A By PCR(!): POSITIVE [SB]  1003 Offered Tylenol, ibuprofen, or prescription Tamiflu, patient declined at this time. Offered work note, patient declines.  Patient appears safe for discharge at this time. [SB]    Clinical Course User Index [SB] Desirea Mizrahi A, PA-C                             Medical Decision Making Amount and/or Complexity of Data Reviewed Labs:  Decision-making details documented in ED Course. Radiology: ordered.  Risk Prescription drug management.   Pt presents with concerns for cough onset 2 days.  No sick contacts at home. Vital signs, patient afebrile, not tachycardic or hypoxic. On exam, pt with no acute cardiovascular, respiratory exam  findings. Differential diagnosis includes COVID, flu, RSV, strep pharyngitis, viral pharyngitis, viral URI with cough, PNA.    Labs:  I ordered, and personally interpreted labs.  The pertinent results include:   Negative COVID, RSV swab. Positive flu A  Imaging: I ordered imaging studies including Chest x-ray I independently visualized and interpreted imaging which showed: no acute findings I agree with the radiologist interpretation  Disposition: Presentation suspicious for Flu A. Doubt COVID, RSV, Strep pharyngitis, or PNA at this time. Discussed with patient regarding use of Tamiflu and discussed thoroughly on its risks and benefits. Follow discussion, patient opted for supportive care at this time. Work/school note provided.  After consideration of the diagnostic results and the patients response to treatment,  I feel that the patient would benefit from Discharge home. Supportive care measures and strict return precautions discussed with patient at bedside. Pt acknowledges and verbalizes understanding. Pt appears safe for discharge. Follow up as indicated in discharge paperwork.    This chart was dictated using voice recognition software, Dragon. Despite the best efforts of this provider to proofread and correct errors, errors may still occur which can change documentation meaning.   Final Clinical Impression(s) / ED Diagnoses Final diagnoses:  Influenza A    Rx / DC Orders ED Discharge Orders     None         Adarryl Goldammer A, PA-C 03/02/22 1507    Jeanell Sparrow, DO 03/04/22 1130

## 2022-03-02 NOTE — Discharge Instructions (Signed)
It was a pleasure taking care of you today!   Your COVID and RSV swabs were negative today. Your chest xray didn't show concerning findings for pneumonia today. Your flu swab was positive for Flu A. You may take over the counter cough and cold medications. Ensure to maintain fluid intake. Follow up with your primary care provider regarding todays ED visit. Ensure that you are wearing your mask and practicing good hand hygiene. Return to the ED if you are experiencing increasing/worsening symptoms.

## 2022-03-03 ENCOUNTER — Emergency Department (HOSPITAL_COMMUNITY)
Admission: EM | Admit: 2022-03-03 | Discharge: 2022-03-03 | Disposition: A | Discharge status: Home / Self Care | Payer: PPO | Attending: Emergency Medicine | Admitting: Emergency Medicine

## 2022-03-03 DIAGNOSIS — J111 Influenza due to unidentified influenza virus with other respiratory manifestations: Secondary | ICD-10-CM | POA: Diagnosis not present

## 2022-03-03 DIAGNOSIS — R112 Nausea with vomiting, unspecified: Secondary | ICD-10-CM

## 2022-03-03 DIAGNOSIS — I10 Essential (primary) hypertension: Secondary | ICD-10-CM | POA: Insufficient documentation

## 2022-03-03 DIAGNOSIS — Z79899 Other long term (current) drug therapy: Secondary | ICD-10-CM | POA: Insufficient documentation

## 2022-03-03 LAB — URINALYSIS, ROUTINE W REFLEX MICROSCOPIC
Bilirubin Urine: NEGATIVE
Glucose, UA: NEGATIVE mg/dL
Hgb urine dipstick: NEGATIVE
Ketones, ur: 5 mg/dL — AB
Leukocytes,Ua: NEGATIVE
Nitrite: NEGATIVE
Protein, ur: NEGATIVE mg/dL
Specific Gravity, Urine: 1.017 (ref 1.005–1.030)
pH: 5 (ref 5.0–8.0)

## 2022-03-03 MED ORDER — ONDANSETRON 4 MG PO TBDP
4.0000 mg | ORAL_TABLET | Freq: Once | ORAL | Status: AC
  Administered 2022-03-03: 4 mg via ORAL
  Filled 2022-03-03: qty 1

## 2022-03-03 MED ORDER — ONDANSETRON HCL 4 MG PO TABS: 4.0000 mg | ORAL_TABLET | Freq: Four times a day (QID) | ORAL | 0 refills | Status: DC

## 2022-03-03 NOTE — ED Provider Notes (Signed)
[facility] Provider Note   CSN: [id number] Arrival date & time: 03/03/22  1713     History {Add pertinent medical, surgical, social history, OB history to HPI:1} Chief Complaint  Patient presents with   Influenza    [name] is a 32 y.o. female.   Influenza      Home Medications Prior to Admission medications   Medication Sig Start Date End Date Taking? Authorizing Provider  albuterol (VENTOLIN HFA) 108 (90 Base) MCG/ACT inhaler Inhale 2 puffs into the lungs every 6 (six) hours as needed for wheezing or shortness of breath.    [provider]  budesonide-formoterol (SYMBICORT) 160-4.5 MCG/ACT inhaler Inhale 2 puffs into the lungs 2 (two) times daily.    [provider]  diclofenac Sodium (VOLTAREN) 1 % GEL Apply 2 g topically 4 (four) times daily as needed (For pain).    [provider]  escitalopram (LEXAPRO) 10 MG tablet Take 1 tablet (10 mg total) by mouth daily for 14 days. 02/07/22 02/22/22  Massengill, Ovid Curd, MD  hydrOXYzine (ATARAX) 25 MG tablet Take 25 mg by mouth 3 (three) times daily as needed for anxiety.    [provider]  metoprolol succinate (TOPROL-XL) 25 MG 24 hr tablet Take 0.5 tablets (12.5 mg total) by mouth daily for 14 days. 02/06/22 02/22/22  Massengill, Ovid Curd, MD  pantoprazole (PROTONIX) 40 MG tablet Take 1 tablet (40 mg total) by mouth daily for 14 days. 02/07/22 02/22/22  Massengill, Ovid Curd, MD  prazosin (MINIPRESS) 1 MG capsule Take 1 capsule (1 mg total) by mouth at bedtime for 14 days. 02/06/22 02/22/22  Massengill, Ovid Curd, MD  pregabalin (LYRICA) 100 MG capsule Take 100 mg by mouth 3 (three) times daily.    [provider]      Allergies    Bee venom, Coconut flavor, Fish allergy, Geodon [ziprasidone hcl], Haloperidol and related, Lithobid [lithium], Roxicodone [oxycodone], Seroquel [quetiapine], Shellfish allergy, Phenergan [promethazine hcl], Prilosec  [omeprazole], Sulfa antibiotics, Tegretol [carbamazepine], Prozac [fluoxetine], Tape, and Tylenol [acetaminophen]    Review of Systems   Review of Systems  Physical Exam Updated Vital Signs BP 104/77 (BP Location: Right Arm)   Pulse 95   Temp 98.4 F (36.9 C) (Oral)   Resp 16   Ht '5\' 4"'$  (1.626 m)   Wt 92 kg   LMP 02/08/2022   SpO2 99%   BMI 34.81 kg/m  Physical Exam  ED Results / Procedures / Treatments   Labs (all labs ordered are listed, but only abnormal results are displayed) Labs Reviewed  URINALYSIS, ROUTINE W REFLEX MICROSCOPIC - Abnormal; Notable for the following components:      Result Value   APPearance HAZY (*)    Ketones, ur 5 (*)    All other components within normal limits    EKG None  Radiology DG Chest 2 View  Result Date: 03/02/2022 CLINICAL DATA:  32 year old female with chest pain shortness of breath and cough. EXAM: CHEST - 2 VIEW COMPARISON:  Chest CTA 01/22/2022 and earlier. FINDINGS: PA and lateral views 0629 hours. Lung volumes and mediastinal contours are stable and within normal limits. Visualized tracheal air column is within normal limits. Both lungs appear clear. No pneumothorax or pleural effusion. No osseous abnormality identified.  Negative visible bowel gas. IMPRESSION: Negative. No cardiopulmonary abnormality. Electronically Signed   By: Genevie Ann M.D.   On: 03/02/2022 06:36    Procedures Procedures  {Document cardiac monitor, telemetry assessment procedure when appropriate:1}  Medications Ordered in ED Medications  ondansetron (ZOFRAN-ODT) disintegrating tablet 4 mg (4 mg Oral Given 03/03/22 1825)    ED Course/ Medical Decision Making/ A&P   {   Click here for ABCD2, HEART and other calculatorsREFRESH Note before signing :1}                          Medical Decision Making Amount and/or Complexity of Data Reviewed Labs: ordered.  Risk Prescription drug management.   ***  {Document critical care time when  appropriate:1} {Document review of labs and clinical decision tools ie heart score, Chads2Vasc2 etc:1}  {Document your independent review of radiology images, and any outside records:1} {Document your discussion with family members, caretakers, and with consultants:1} {Document social determinants of health affecting pt's care:1} {Document your decision making why or why not admission, treatments were needed:1} Final Clinical Impression(s) / ED Diagnoses Final diagnoses:  None    Rx / DC Orders ED Discharge Orders     None

## 2022-03-03 NOTE — Discharge Instructions (Signed)
You were seen for your nausea and vomiting in the emergency department.  It is likely from influenza.  At home, please take the Zofran we have prescribed you for your nausea and vomiting.  Please take any over-the-counter flu medications that you do not have an adverse reaction to her allergy to.    Follow-up with your primary doctor in 2-3 days regarding your visit.    Return immediately to the emergency department if you experience any of the following: Difficulty breathing, fainting, persistent vomiting despite the medication, or any other concerning symptoms.    Thank you for visiting our Emergency Department. It was a pleasure taking care of you today.

## 2022-03-03 NOTE — ED Notes (Signed)
Pt tolerated PO fluids well and ambulated to the bathroom with 100% sats

## 2022-03-03 NOTE — ED Notes (Signed)
Patient ambulated with pulse ox, stayed at 100% the entire time on RA.

## 2022-03-03 NOTE — ED Triage Notes (Signed)
PT BIB EMS due to having the flu. Pt tested positive for flu yesterday and states she still does not feel better. Pt ambulatory, axox4.

## 2022-03-10 ENCOUNTER — Ambulatory Visit (HOSPITAL_COMMUNITY)
Admission: EM | Admit: 2022-03-10 | Discharge: 2022-03-11 | Disposition: A | Discharge status: Home / Self Care | Payer: Medicare PPO | Attending: Nurse Practitioner | Admitting: Nurse Practitioner

## 2022-03-10 DIAGNOSIS — F411 Generalized anxiety disorder: Secondary | ICD-10-CM | POA: Insufficient documentation

## 2022-03-10 DIAGNOSIS — Z9152 Personal history of nonsuicidal self-harm: Secondary | ICD-10-CM | POA: Insufficient documentation

## 2022-03-10 DIAGNOSIS — F250 Schizoaffective disorder, bipolar type: Secondary | ICD-10-CM | POA: Diagnosis not present

## 2022-03-10 DIAGNOSIS — F840 Autistic disorder: Secondary | ICD-10-CM | POA: Diagnosis not present

## 2022-03-10 DIAGNOSIS — R45851 Suicidal ideations: Secondary | ICD-10-CM | POA: Diagnosis present

## 2022-03-10 DIAGNOSIS — F4310 Post-traumatic stress disorder, unspecified: Secondary | ICD-10-CM | POA: Insufficient documentation

## 2022-03-10 DIAGNOSIS — F603 Borderline personality disorder: Secondary | ICD-10-CM | POA: Diagnosis not present

## 2022-03-10 DIAGNOSIS — R454 Irritability and anger: Secondary | ICD-10-CM | POA: Diagnosis not present

## 2022-03-10 DIAGNOSIS — Z1152 Encounter for screening for COVID-19: Secondary | ICD-10-CM | POA: Insufficient documentation

## 2022-03-10 DIAGNOSIS — Z79899 Other long term (current) drug therapy: Secondary | ICD-10-CM | POA: Diagnosis not present

## 2022-03-10 DIAGNOSIS — Z9151 Personal history of suicidal behavior: Secondary | ICD-10-CM | POA: Insufficient documentation

## 2022-03-10 LAB — POC SARS CORONAVIRUS 2 AG: SARSCOV2ONAVIRUS 2 AG: NEGATIVE

## 2022-03-10 LAB — POCT PREGNANCY, URINE: Preg Test, Ur: NEGATIVE

## 2022-03-10 MED ORDER — HYDROXYZINE HCL 25 MG PO TABS: 25.0000 mg | ORAL_TABLET | Freq: Three times a day (TID) | ORAL | Status: DC | PRN

## 2022-03-10 MED ORDER — ALUM & MAG HYDROXIDE-SIMETH 200-200-20 MG/5ML PO SUSP: 30.0000 mL | ORAL | Status: DC | PRN

## 2022-03-10 MED ORDER — MAGNESIUM HYDROXIDE 400 MG/5ML PO SUSP: 30.0000 mL | Freq: Every day | ORAL | Status: DC | PRN

## 2022-03-10 NOTE — ED Provider Notes (Incomplete)
[facility] Continuous Assessment Admission H&P  Date: 03/10/22 Patient Name: [name] MRN: [id number] Chief Complaint: Suicidal Ideations  Diagnoses:  Final diagnoses:  None    [name] is a 32 y/o female with a history of schizoaffective disorder and suicidal ideations presenting voluntarily to [facility] for suicidal ideations brought in tonight by [employer].  While waiting in the assessment room for the provider patient became irate and began yelling, banging on the door, kicking a door demanding to leave stating that she was no longer suicidal. [facility] spoke with a patient and sat with patient and was able to calm her down until provider was able to meet with patient.   Of note this is patient's 23rd ED visits in the last 6 months.  Total Time spent with patient: 30 minutes  Musculoskeletal  Strength & Muscle Tone: within normal limits Gait & Station: normal Patient leans: N/A  Psychiatric Specialty Exam  Presentation General Appearance:  Casual  Eye Contact: Fair  Speech: Clear and Coherent  Speech Volume: Normal  Handedness: Right   Mood and Affect  Mood: Angry  Affect: Labile   Thought Process  Thought Processes: Coherent; Goal Directed  Descriptions of Associations:Intact  Orientation:Full (Time, Place and Person)  Thought Content:WDL  Diagnosis of Schizophrenia or Schizoaffective disorder in past: Yes  Duration of Psychotic Symptoms: Greater than six months  Hallucinations:Hallucinations: None Description of Auditory Hallucinations: None  Ideas of Reference:None  Suicidal Thoughts:Suicidal Thoughts: Yes, Active SI Active Intent and/or Plan: With Intent; With Plan SI Passive Intent and/or Plan: Without Intent; Without Plan  Homicidal Thoughts:Homicidal Thoughts: No   Sensorium  Memory: Immediate Fair  Judgment: Poor  Insight: Poor   Executive Functions  Concentration: Fair  Attention  Span: Fair  Recall: [place] of Knowledge: Good  Language: Good   Psychomotor Activity  Psychomotor Activity: Psychomotor Activity: Normal   Assets  Assets: Communication Skills; Housing; Physical Health; Resilience   Sleep  Sleep: Sleep: Fair Number of Hours of Sleep: -1   Nutritional Assessment (For OBS and FBC admissions only) Has the patient had a weight loss or gain of 10 pounds or more in the last 3 months?: No Has the patient had a decrease in food intake/or appetite?: No Does the patient have dental problems?: No Does the patient have eating habits or behaviors that may be indicators of an eating disorder including binging or inducing vomiting?: No Has the patient recently lost weight without trying?: 0 Has the patient been eating poorly because of a decreased appetite?: 0 Malnutrition Screening Tool Score: 0    Physical Exam HENT:     Head: Normocephalic and atraumatic.     Nose: Nose normal.  Eyes:     Pupils: Pupils are equal, round, and reactive to light.  Cardiovascular:     Rate and Rhythm: Normal rate.  Pulmonary:     Effort: Pulmonary effort is normal.  Abdominal:     General: Abdomen is flat.  Musculoskeletal:        General: Normal range of motion.     Cervical back: Normal range of motion.  Skin:    General: Skin is warm.  Neurological:     Mental Status: She is alert and oriented to person, place, and time.  Psychiatric:        Attention and Perception: Attention normal.        Mood and Affect: Mood is anxious and depressed. Affect is angry.  Speech: Speech normal.        Behavior: Behavior is agitated.        Thought Content: Thought content includes suicidal ideation. Thought content includes suicidal plan.        Cognition and Memory: Cognition normal.        Judgment: Judgment is impulsive.    Review of Systems  Constitutional: Negative.   HENT: Negative.    Eyes: Negative.   Respiratory: Negative.     Cardiovascular: Negative.   Gastrointestinal: Negative.   Genitourinary: Negative.   Musculoskeletal: Negative.   Skin: Negative.   Neurological: Negative.   Endo/Heme/Allergies: Negative.   Psychiatric/Behavioral:  Positive for suicidal ideas. The patient is nervous/anxious.     Blood pressure 108/70, pulse 83, temperature 97.9 F (36.6 C), temperature source Oral, resp. rate 18, last menstrual period 02/08/2022, SpO2 100 %. There is no height or weight on file to calculate BMI.  Past Psychiatric History: ***   Is the patient at risk to self? Yes  Has the patient been a risk to self in the past 6 months? Yes .    Has the patient been a risk to self within the distant past? Yes   Is the patient a risk to others? No   Has the patient been a risk to others in the past 6 months? No   Has the patient been a risk to others within the distant past? No   Past Medical History: ***  Family History: ***  Social History: ***  Last Labs:  Admission on 03/03/2022, Discharged on 03/03/2022  Component Date Value Ref Range Status  . Color, Urine 03/03/2022 YELLOW  YELLOW Final  . APPearance 03/03/2022 HAZY (A)  CLEAR Final  . Specific Gravity, Urine 03/03/2022 1.017  1.005 - 1.030 Final  . pH 03/03/2022 5.0  5.0 - 8.0 Final  . Glucose, UA 03/03/2022 NEGATIVE  NEGATIVE mg/dL Final  . Hgb urine dipstick 03/03/2022 NEGATIVE  NEGATIVE Final  . Bilirubin Urine 03/03/2022 NEGATIVE  NEGATIVE Final  . Ketones, ur 03/03/2022 5 (A)  NEGATIVE mg/dL Final  . Protein, ur 03/03/2022 NEGATIVE  NEGATIVE mg/dL Final  . Nitrite 03/03/2022 NEGATIVE  NEGATIVE Final  . Chalmers Guest 03/03/2022 NEGATIVE  NEGATIVE Final   Performed at [facility], [place] [address], [place], [place] [address]  Admission on 03/02/2022, Discharged on 03/02/2022  Component Date Value Ref Range Status  . SARS Coronavirus 2 by RT PCR 03/02/2022 NEGATIVE  NEGATIVE Final   Comment: (NOTE) SARS-CoV-2 target nucleic acids are  NOT DETECTED.  The SARS-CoV-2 RNA is generally detectable in upper respiratory specimens during the acute phase of infection. The lowest concentration of SARS-CoV-2 viral copies this assay can detect is 138 copies/mL. A negative result does not preclude SARS-Cov-2 infection and should not be used as the sole basis for treatment or other patient management decisions. A negative result may occur with  improper specimen collection/handling, submission of specimen other than nasopharyngeal swab, presence of viral mutation(s) within the areas targeted by this assay, and inadequate number of viral copies(<138 copies/mL). A negative result must be combined with clinical observations, patient history, and epidemiological information. The expected result is Negative.  Fact Sheet for Patients:  EntrepreneurPulse.com.au  Fact Sheet for Healthcare Providers:  IncredibleEmployment.be  This test is no                          t yet approved or cleared by the [place]  FDA and  has been authorized for detection and/or diagnosis of SARS-CoV-2 by FDA under an Emergency Use Authorization (EUA). This EUA will remain  in effect (meaning this test can be used) for the duration of the COVID-19 declaration under Section 564(b)(1) of the Act, 21 U.S.C.section 360bbb-3(b)(1), unless the authorization is terminated  or revoked sooner.      . Influenza A by PCR 03/02/2022 POSITIVE (A)  NEGATIVE Final  . Influenza B by PCR 03/02/2022 NEGATIVE  NEGATIVE Final   Comment: (NOTE) The Xpert Xpress SARS-CoV-2/FLU/RSV plus assay is intended as an aid in the diagnosis of influenza from Nasopharyngeal swab specimens and should not be used as a sole basis for treatment. Nasal washings and aspirates are unacceptable for Xpert Xpress SARS-CoV-2/FLU/RSV testing.  Fact Sheet for Patients: EntrepreneurPulse.com.au  Fact Sheet for Healthcare  Providers: IncredibleEmployment.be  This test is not yet approved or cleared by the [place] FDA and has been authorized for detection and/or diagnosis of SARS-CoV-2 by FDA under an Emergency Use Authorization (EUA). This EUA will remain in effect (meaning this test can be used) for the duration of the COVID-19 declaration under Section 564(b)(1) of the Act, 21 U.S.C. section 360bbb-3(b)(1), unless the authorization is terminated or revoked.    Marland Kitchen Resp Syncytial Virus by PCR 03/02/2022 NEGATIVE  NEGATIVE Final   Comment: (NOTE) Fact Sheet for Patients: EntrepreneurPulse.com.au  Fact Sheet for Healthcare Providers: IncredibleEmployment.be  This test is not yet approved or cleared by the [place] FDA and has been authorized for detection and/or diagnosis of SARS-CoV-2 by FDA under an Emergency Use Authorization (EUA). This EUA will remain in effect (meaning this test can be used) for the duration of the COVID-19 declaration under Section 564(b)(1) of the Act, 21 U.S.C. section 360bbb-3(b)(1), unless the authorization is terminated or revoked.  Performed at [facility], [place] [address], [place], [place] [address]   Admission on 02/23/2022, Discharged on 02/23/2022  Component Date Value Ref Range Status  . Lipase 02/23/2022 32  11 - 51 U/L Final   Performed at [facility], [place] [address], [place], [place] [address]  . Sodium 02/23/2022 137  135 - 145 mmol/L Final  . Potassium 02/23/2022 3.8  3.5 - 5.1 mmol/L Final  . Chloride 02/23/2022 107  98 - 111 mmol/L Final  . CO2 02/23/2022 21 (L)  22 - 32 mmol/L Final  . Glucose, Bld 02/23/2022 104 (H)  70 - 99 mg/dL Final   Glucose reference range applies only to samples taken after fasting for at least 8 hours.  . BUN 02/23/2022 9  6 - 20 mg/dL Final  . Creatinine, Ser 02/23/2022 0.71  0.44 - 1.00 mg/dL Final  . Calcium 02/23/2022 8.4 (L)  8.9 -  10.3 mg/dL Final  . Total Protein 02/23/2022 6.8  6.5 - 8.1 g/dL Final  . Albumin 02/23/2022 3.7  3.5 - 5.0 g/dL Final  . AST 02/23/2022 15  15 - 41 U/L Final  . ALT 02/23/2022 15  0 - 44 U/L Final  . Alkaline Phosphatase 02/23/2022 68  38 - 126 U/L Final  . Total Bilirubin 02/23/2022 0.6  0.3 - 1.2 mg/dL Final  . GFR, Estimated 02/23/2022 >60  >60 mL/min Final   Comment: (NOTE) Calculated using the CKD-EPI Creatinine Equation (2021)   . Anion gap 02/23/2022 9  5 - 15 Final   Performed at [place] [address], [place], [place] [address]  . WBC 02/23/2022 7.7  4.0 - 10.5 K/uL Final  . RBC 02/23/2022 4.88  3.87 - 5.11 MIL/uL Final  . Hemoglobin 02/23/2022 12.3  12.0 - 15.0 g/dL Final  . HCT 02/23/2022 40.4  36.0 - 46.0 % Final  . MCV 02/23/2022 82.8  80.0 - 100.0 fL Final  . MCH 02/23/2022 25.2 (L)  26.0 - 34.0 pg Final  . MCHC 02/23/2022 30.4  30.0 - 36.0 g/dL Final  . RDW 02/23/2022 14.1  11.5 - 15.5 % Final  . Platelets 02/23/2022 213  150 - 400 K/uL Final  . nRBC 02/23/2022 0.0  0.0 - 0.2 % Final   Performed at [facility], [place] [address], [place], [place] [address]  . I-stat hCG, quantitative 02/23/2022 <5.0  <5 mIU/mL Final  . Comment 3 02/23/2022          Final   Comment:   GEST. AGE      CONC.  (mIU/mL)   <=1 WEEK        5 - 50     2 WEEKS       50 - 500     3 WEEKS       100 - 10,000     4 WEEKS     1,000 - 30,000        FEMALE AND NON-PREGNANT FEMALE:     LESS THAN 5 mIU/mL   Admission on 02/21/2022, Discharged on 02/22/2022  Component Date Value Ref Range Status  . SARS Coronavirus 2 by RT PCR 02/21/2022 NEGATIVE  NEGATIVE Final   Comment: (NOTE) SARS-CoV-2 target nucleic acids are NOT DETECTED.  The SARS-CoV-2 RNA is generally detectable in upper respiratory specimens during the acute phase of infection. The lowest concentration of SARS-CoV-2 viral copies this assay can detect is 138 copies/mL. A negative result does not preclude  SARS-Cov-2 infection and should not be used as the sole basis for treatment or other patient management decisions. A negative result may occur with  improper specimen collection/handling, submission of specimen other than nasopharyngeal swab, presence of viral mutation(s) within the areas targeted by this assay, and inadequate number of viral copies(<138 copies/mL). A negative result must be combined with clinical observations, patient history, and epidemiological information. The expected result is Negative.  Fact Sheet for Patients:  EntrepreneurPulse.com.au  Fact Sheet for Healthcare Providers:  IncredibleEmployment.be  This test is no                          t yet approved or cleared by the [place] FDA and  has been authorized for detection and/or diagnosis of SARS-CoV-2 by FDA under an Emergency Use Authorization (EUA). This EUA will remain  in effect (meaning this test can be used) for the duration of the COVID-19 declaration under Section 564(b)(1) of the Act, 21 U.S.C.section 360bbb-3(b)(1), unless the authorization is terminated  or revoked sooner.      . Influenza A by PCR 02/21/2022 NEGATIVE  NEGATIVE Final  . Influenza B by PCR 02/21/2022 NEGATIVE  NEGATIVE Final   Comment: (NOTE) The Xpert Xpress SARS-CoV-2/FLU/RSV plus assay is intended as an aid in the diagnosis of influenza from Nasopharyngeal swab specimens and should not be used as a sole basis for treatment. Nasal washings and aspirates are unacceptable for Xpert Xpress SARS-CoV-2/FLU/RSV testing.  Fact Sheet for Patients: EntrepreneurPulse.com.au  Fact Sheet for Healthcare Providers: IncredibleEmployment.be  This test is not yet approved or cleared by the [place] FDA and has been  authorized for detection and/or diagnosis of SARS-CoV-2 by FDA under an Emergency Use Authorization (EUA). This EUA will remain in effect  (meaning this test can be used) for the duration of the COVID-19 declaration under Section 564(b)(1) of the Act, 21 U.S.C. section 360bbb-3(b)(1), unless the authorization is terminated or revoked.    Marland Kitchen Resp Syncytial Virus by PCR 02/21/2022 NEGATIVE  NEGATIVE Final   Comment: (NOTE) Fact Sheet for Patients: EntrepreneurPulse.com.au  Fact Sheet for Healthcare Providers: IncredibleEmployment.be  This test is not yet approved or cleared by the [place] FDA and has been authorized for detection and/or diagnosis of SARS-CoV-2 by FDA under an Emergency Use Authorization (EUA). This EUA will remain in effect (meaning this test can be used) for the duration of the COVID-19 declaration under Section 564(b)(1) of the Act, 21 U.S.C. section 360bbb-3(b)(1), unless the authorization is terminated or revoked.  Performed at [facility], [place] [address], [place], [place] [address]   . Preg Test, Ur 02/22/2022 Negative  Negative Final  . POC Amphetamine UR 02/22/2022 None Detected  NONE DETECTED (Cut Off Level 1000 ng/mL) Final  . POC Secobarbital (BAR) 02/22/2022 None Detected  NONE DETECTED (Cut Off Level 300 ng/mL) Final  . POC Buprenorphine (BUP) 02/22/2022 None Detected  NONE DETECTED (Cut Off Level 10 ng/mL) Final  . POC Oxazepam (BZO) 02/22/2022 None Detected  NONE DETECTED (Cut Off Level 300 ng/mL) Final  . POC Cocaine UR 02/22/2022 None Detected  NONE DETECTED (Cut Off Level 300 ng/mL) Final  . POC Methamphetamine UR 02/22/2022 None Detected  NONE DETECTED (Cut Off Level 1000 ng/mL) Final  . POC Morphine 02/22/2022 None Detected  NONE DETECTED (Cut Off Level 300 ng/mL) Final  . POC Methadone UR 02/22/2022 None Detected  NONE DETECTED (Cut Off Level 300 ng/mL) Final  . POC Oxycodone UR 02/22/2022 None Detected  NONE DETECTED (Cut Off Level 100 ng/mL) Final  . POC Marijuana UR 02/22/2022 None Detected  NONE DETECTED (Cut Off Level 50 ng/mL)  Final  . SARSCOV2ONAVIRUS 2 AG 02/21/2022 NEGATIVE  NEGATIVE Final   Comment: (NOTE) SARS-CoV-2 antigen NOT DETECTED.   Negative results are presumptive.  Negative results do not preclude SARS-CoV-2 infection and should not be used as the sole basis for treatment or other patient management decisions, including infection  control decisions, particularly in the presence of clinical signs and  symptoms consistent with COVID-19, or in those who have been in contact with the virus.  Negative results must be combined with clinical observations, patient history, and epidemiological information. The expected result is Negative.  Fact Sheet for Patients: HandmadeRecipes.com.cy  Fact Sheet for Healthcare Providers: FuneralLife.at  This test is not yet approved or cleared by the [place] FDA and  has been authorized for detection and/or diagnosis of SARS-CoV-2 by FDA under an Emergency Use Authorization (EUA).  This EUA will remain in effect (meaning this test can be used) for the duration of  the COV                          ID-19 declaration under Section 564(b)(1) of the Act, 21 U.S.C. section 360bbb-3(b)(1), unless the authorization is terminated or revoked sooner.    . Preg Test, Ur 02/22/2022 NEGATIVE  NEGATIVE Final   Comment:        THE SENSITIVITY OF THIS METHODOLOGY IS >24 mIU/mL   Admission on 02/15/2022, Discharged on 02/15/2022  Component Date Value Ref Range Status  . Sodium  02/15/2022 136  135 - 145 mmol/L Final  . Potassium 02/15/2022 4.3  3.5 - 5.1 mmol/L Final  . Chloride 02/15/2022 103  98 - 111 mmol/L Final  . CO2 02/15/2022 22  22 - 32 mmol/L Final  . Glucose, Bld 02/15/2022 107 (H)  70 - 99 mg/dL Final   Glucose reference range applies only to samples taken after fasting for at least 8 hours.  . BUN 02/15/2022 7  6 - 20 mg/dL Final  . Creatinine, Ser 02/15/2022 0.66  0.44 - 1.00 mg/dL Final  . Calcium  02/15/2022 9.1  8.9 - 10.3 mg/dL Final  . GFR, Estimated 02/15/2022 >60  >60 mL/min Final   Comment: (NOTE) Calculated using the CKD-EPI Creatinine Equation (2021)   . Anion gap 02/15/2022 11  5 - 15 Final   Performed at [facility] [address] [place], [place] [address]  . WBC 02/15/2022 10.2  4.0 - 10.5 K/uL Final  . RBC 02/15/2022 5.24 (H)  3.87 - 5.11 MIL/uL Final  . Hemoglobin 02/15/2022 13.3  12.0 - 15.0 g/dL Final  . HCT 02/15/2022 43.0  36.0 - 46.0 % Final  . MCV 02/15/2022 82.1  80.0 - 100.0 fL Final  . MCH 02/15/2022 25.4 (L)  26.0 - 34.0 pg Final  . MCHC 02/15/2022 30.9  30.0 - 36.0 g/dL Final  . RDW 02/15/2022 13.9  11.5 - 15.5 % Final  . Platelets 02/15/2022 329  150 - 400 K/uL Final  . nRBC 02/15/2022 0.0  0.0 - 0.2 % Final   Performed at [facility], [place] [address], [place], [place] [address]  . Color, Urine 02/15/2022 YELLOW  YELLOW Final  . APPearance 02/15/2022 CLEAR  CLEAR Final  . Specific Gravity, Urine 02/15/2022 1.009  1.005 - 1.030 Final  . pH 02/15/2022 6.0  5.0 - 8.0 Final  . Glucose, UA 02/15/2022 NEGATIVE  NEGATIVE mg/dL Final  . Hgb urine dipstick 02/15/2022 NEGATIVE  NEGATIVE Final  . Bilirubin Urine 02/15/2022 NEGATIVE  NEGATIVE Final  . Ketones, ur 02/15/2022 NEGATIVE  NEGATIVE mg/dL Final  . Protein, ur 02/15/2022 NEGATIVE  NEGATIVE mg/dL Final  . Nitrite 02/15/2022 NEGATIVE  NEGATIVE Final  . Chalmers Guest 02/15/2022 NEGATIVE  NEGATIVE Final   Performed at [facility], [place] [address] [place],  [address]  . I-stat hCG, quantitative 02/15/2022 <5.0  <5 mIU/mL Final  . Comment 3 02/15/2022          Final   Comment:   GEST. AGE      CONC.  (mIU/mL)   <=1 WEEK        5 - 50     2 WEEKS       50 - 500     3 WEEKS       100 - 10,000     4 WEEKS     1,000 - 30,000        FEMALE AND NON-PREGNANT FEMALE:     LESS THAN 5 mIU/mL   Admission on 02/10/2022, Discharged on 02/11/2022  Component Date Value Ref Range Status  .  SARS Coronavirus 2 by RT PCR 02/10/2022 NEGATIVE  NEGATIVE Final   Comment: (NOTE) SARS-CoV-2 target nucleic acids are NOT DETECTED.  The SARS-CoV-2 RNA is generally detectable in upper respiratory specimens during the acute phase of infection. The lowest concentration of SARS-CoV-2 viral copies this assay can detect is 138 copies/mL. A negative result does not preclude SARS-Cov-2 infection and should not be  used as the sole basis for treatment or other patient management decisions. A negative result may occur with  improper specimen collection/handling, submission of specimen other than nasopharyngeal swab, presence of viral mutation(s) within the areas targeted by this assay, and inadequate number of viral copies(<138 copies/mL). A negative result must be combined with clinical observations, patient history, and epidemiological information. The expected result is Negative.  Fact Sheet for Patients:  EntrepreneurPulse.com.au  Fact Sheet for Healthcare Providers:  IncredibleEmployment.be  This test is no                          t yet approved or cleared by the [place] FDA and  has been authorized for detection and/or diagnosis of SARS-CoV-2 by FDA under an Emergency Use Authorization (EUA). This EUA will remain  in effect (meaning this test can be used) for the duration of the COVID-19 declaration under Section 564(b)(1) of the Act, 21 U.S.C.section 360bbb-3(b)(1), unless the authorization is terminated  or revoked sooner.      . Influenza A by PCR 02/10/2022 NEGATIVE  NEGATIVE Final  . Influenza B by PCR 02/10/2022 NEGATIVE  NEGATIVE Final   Comment: (NOTE) The Xpert Xpress SARS-CoV-2/FLU/RSV plus assay is intended as an aid in the diagnosis of influenza from Nasopharyngeal swab specimens and should not be used as a sole basis for treatment. Nasal washings and aspirates are unacceptable for Xpert Xpress  SARS-CoV-2/FLU/RSV testing.  Fact Sheet for Patients: EntrepreneurPulse.com.au  Fact Sheet for Healthcare Providers: IncredibleEmployment.be  This test is not yet approved or cleared by the [place] FDA and has been authorized for detection and/or diagnosis of SARS-CoV-2 by FDA under an Emergency Use Authorization (EUA). This EUA will remain in effect (meaning this test can be used) for the duration of the COVID-19 declaration under Section 564(b)(1) of the Act, 21 U.S.C. section 360bbb-3(b)(1), unless the authorization is terminated or revoked.    Marland Kitchen Resp Syncytial Virus by PCR 02/10/2022 NEGATIVE  NEGATIVE Final   Comment: (NOTE) Fact Sheet for Patients: EntrepreneurPulse.com.au  Fact Sheet for Healthcare Providers: IncredibleEmployment.be  This test is not yet approved or cleared by the [place] FDA and has been authorized for detection and/or diagnosis of SARS-CoV-2 by FDA under an Emergency Use Authorization (EUA). This EUA will remain in effect (meaning this test can be used) for the duration of the COVID-19 declaration under Section 564(b)(1) of the Act, 21 U.S.C. section 360bbb-3(b)(1), unless the authorization is terminated or revoked.  Performed at [facility], [place] [address] [place], [place] [address]   . Preg Test, Ur 02/10/2022 Negative  Negative Preliminary  . POC Amphetamine UR 02/10/2022 None Detected  NONE DETECTED (Cut Off Level 1000 ng/mL) Preliminary  . POC Secobarbital (BAR) 02/10/2022 None Detected  NONE DETECTED (Cut Off Level 300 ng/mL) Preliminary  . POC Buprenorphine (BUP) 02/10/2022 None Detected  NONE DETECTED (Cut Off Level 10 ng/mL) Preliminary  . POC Oxazepam (BZO) 02/10/2022 None Detected  NONE DETECTED (Cut Off Level 300 ng/mL) Preliminary  . POC Cocaine UR 02/10/2022 None Detected  NONE DETECTED (Cut Off Level 300 ng/mL) Preliminary  . POC Methamphetamine  UR 02/10/2022 None Detected (A)  NONE DETECTED (Cut Off Level 1000 ng/mL) Preliminary  . POC Morphine 02/10/2022 None Detected  NONE DETECTED (Cut Off Level 300 ng/mL) Preliminary  . POC Methadone UR 02/10/2022 None Detected  NONE DETECTED (Cut Off Level 300 ng/mL) Preliminary  . POC Oxycodone UR 02/10/2022 None Detected  NONE DETECTED (Cut Off Level 100 ng/mL) Preliminary  . POC Marijuana UR 02/10/2022 None Detected  NONE DETECTED (Cut Off Level 50 ng/mL) Preliminary  . SARSCOV2ONAVIRUS 2 AG 02/10/2022 NEGATIVE  NEGATIVE Final   Comment: (NOTE) SARS-CoV-2 antigen NOT DETECTED.   Negative results are presumptive.  Negative results do not preclude SARS-CoV-2 infection and should not be used as the sole basis for treatment or other patient management decisions, including infection  control decisions, particularly in the presence of clinical signs and  symptoms consistent with COVID-19, or in those who have been in contact with the virus.  Negative results must be combined with clinical observations, patient history, and epidemiological information. The expected result is Negative.  Fact Sheet for Patients: HandmadeRecipes.com.cy  Fact Sheet for Healthcare Providers: FuneralLife.at  This test is not yet approved or cleared by the [place] FDA and  has been authorized for detection and/or diagnosis of SARS-CoV-2 by FDA under an Emergency Use Authorization (EUA).  This EUA will remain in effect (meaning this test can be used) for the duration of  the COV                          ID-19 declaration under Section 564(b)(1) of the Act, 21 U.S.C. section 360bbb-3(b)(1), unless the authorization is terminated or revoked sooner.    . Preg Test, Ur 02/10/2022 NEGATIVE  NEGATIVE Final   Comment:        THE SENSITIVITY OF THIS METHODOLOGY IS >24 mIU/mL   Admission on 01/30/2022, Discharged on 01/31/2022  Component Date Value Ref Range Status   . SARS Coronavirus 2 by RT PCR 01/30/2022 NEGATIVE  NEGATIVE Final   Comment: (NOTE) SARS-CoV-2 target nucleic acids are NOT DETECTED.  The SARS-CoV-2 RNA is generally detectable in upper respiratory specimens during the acute phase of infection. The lowest concentration of SARS-CoV-2 viral copies this assay can detect is 138 copies/mL. A negative result does not preclude SARS-Cov-2 infection and should not be used as the sole basis for treatment or other patient management decisions. A negative result may occur with  improper specimen collection/handling, submission of specimen other than nasopharyngeal swab, presence of viral mutation(s) within the areas targeted by this assay, and inadequate number of viral copies(<138 copies/mL). A negative result must be combined with clinical observations, patient history, and epidemiological information. The expected result is Negative.  Fact Sheet for Patients:  EntrepreneurPulse.com.au  Fact Sheet for Healthcare Providers:  IncredibleEmployment.be  This test is no                          t yet approved or cleared by the [place] FDA and  has been authorized for detection and/or diagnosis of SARS-CoV-2 by FDA under an Emergency Use Authorization (EUA). This EUA will remain  in effect (meaning this test can be used) for the duration of the COVID-19 declaration under Section 564(b)(1) of the Act, 21 U.S.C.section 360bbb-3(b)(1), unless the authorization is terminated  or revoked sooner.      . Influenza A by PCR 01/30/2022 NEGATIVE  NEGATIVE Final  . Influenza B by PCR 01/30/2022 NEGATIVE  NEGATIVE Final   Comment: (NOTE) The Xpert Xpress SARS-CoV-2/FLU/RSV plus assay is intended as an aid in the diagnosis of influenza from Nasopharyngeal swab specimens and should not be used as a sole basis for treatment. Nasal washings and aspirates are unacceptable for Xpert Xpress  SARS-CoV-2/FLU/RSV testing.  Fact Sheet for  Patients: EntrepreneurPulse.com.au  Fact Sheet for Healthcare Providers: IncredibleEmployment.be  This test is not yet approved or cleared by the [place] FDA and has been authorized for detection and/or diagnosis of SARS-CoV-2 by FDA under an Emergency Use Authorization (EUA). This EUA will remain in effect (meaning this test can be used) for the duration of the COVID-19 declaration under Section 564(b)(1) of the Act, 21 U.S.C. section 360bbb-3(b)(1), unless the authorization is terminated or revoked.    Marland Kitchen Resp Syncytial Virus by PCR 01/30/2022 NEGATIVE  NEGATIVE Final   Comment: (NOTE) Fact Sheet for Patients: EntrepreneurPulse.com.au  Fact Sheet for Healthcare Providers: IncredibleEmployment.be  This test is not yet approved or cleared by the [place] FDA and has been authorized for detection and/or diagnosis of SARS-CoV-2 by FDA under an Emergency Use Authorization (EUA). This EUA will remain in effect (meaning this test can be used) for the duration of the COVID-19 declaration under Section 564(b)(1) of the Act, 21 U.S.C. section 360bbb-3(b)(1), unless the authorization is terminated or revoked.  Performed at [facility], [place] [address] [place], [place] [address]   . WBC 01/30/2022 12.5 (H)  4.0 - 10.5 K/uL Final  . RBC 01/30/2022 5.25 (H)  3.87 - 5.11 MIL/uL Final  . Hemoglobin 01/30/2022 13.4  12.0 - 15.0 g/dL Final  . HCT 01/30/2022 42.8  36.0 - 46.0 % Final  . MCV 01/30/2022 81.5  80.0 - 100.0 fL Final  . MCH 01/30/2022 25.5 (L)  26.0 - 34.0 pg Final  . MCHC 01/30/2022 31.3  30.0 - 36.0 g/dL Final  . RDW 01/30/2022 14.0  11.5 - 15.5 % Final  . Platelets 01/30/2022 293  150 - 400 K/uL Final  . nRBC 01/30/2022 0.0  0.0 - 0.2 % Final  . Neutrophils Relative % 01/30/2022 66  % Final  . Neutro Abs 01/30/2022 8.2 (H)  1.7 - 7.7 K/uL  Final  . Lymphocytes Relative 01/30/2022 28  % Final  . Lymphs Abs 01/30/2022 3.5  0.7 - 4.0 K/uL Final  . Monocytes Relative 01/30/2022 5  % Final  . Monocytes Absolute 01/30/2022 0.6  0.1 - 1.0 K/uL Final  . Eosinophils Relative 01/30/2022 1  % Final  . Eosinophils Absolute 01/30/2022 0.1  0.0 - 0.5 K/uL Final  . Basophils Relative 01/30/2022 0  % Final  . Basophils Absolute 01/30/2022 0.0  0.0 - 0.1 K/uL Final  . Immature Granulocytes 01/30/2022 0  % Final  . Abs Immature Granulocytes 01/30/2022 0.04  0.00 - 0.07 K/uL Final   Performed at [facility], [place] [address], [place], [place] [address]  . Sodium 01/30/2022 139  135 - 145 mmol/L Final  . Potassium 01/30/2022 3.8  3.5 - 5.1 mmol/L Final  . Chloride 01/30/2022 108  98 - 111 mmol/L Final  . CO2 01/30/2022 23  22 - 32 mmol/L Final  . Glucose, Bld 01/30/2022 92  70 - 99 mg/dL Final   Glucose reference range applies only to samples taken after fasting for at least 8 hours.  . BUN 01/30/2022 14  6 - 20 mg/dL Final  . Creatinine, Ser 01/30/2022 0.92  0.44 - 1.00 mg/dL Final  . Calcium 01/30/2022 9.7  8.9 - 10.3 mg/dL Final  . Total Protein 01/30/2022 8.2 (H)  6.5 - 8.1 g/dL Final  . Albumin 01/30/2022 4.6  3.5 - 5.0 g/dL Final  . AST 01/30/2022 18  15 - 41 U/L Final  . ALT 01/30/2022 15  0 - 44 U/L  Final  . Alkaline Phosphatase 01/30/2022 65  38 - 126 U/L Final  . Total Bilirubin 01/30/2022 0.4  0.3 - 1.2 mg/dL Final  . GFR, Estimated 01/30/2022 >60  >60 mL/min Final   Comment: (NOTE) Calculated using the CKD-EPI Creatinine Equation (2021)   . Anion gap 01/30/2022 8  5 - 15 Final   Performed at [facility] [address] [place], [place] [address]  . Preg Test, Ur 01/30/2022 Negative  Negative Final  . POC Amphetamine UR 01/30/2022 None Detected  NONE DETECTED (Cut Off Level 1000 ng/mL) Final  . POC Secobarbital (BAR) 01/30/2022 None Detected  NONE DETECTED (Cut Off Level 300 ng/mL) Final  . POC Buprenorphine  (BUP) 01/30/2022 None Detected  NONE DETECTED (Cut Off Level 10 ng/mL) Final  . POC Oxazepam (BZO) 01/30/2022 None Detected  NONE DETECTED (Cut Off Level 300 ng/mL) Final  . POC Cocaine UR 01/30/2022 None Detected  NONE DETECTED (Cut Off Level 300 ng/mL) Final  . POC Methamphetamine UR 01/30/2022 None Detected  NONE DETECTED (Cut Off Level 1000 ng/mL) Final  . POC Morphine 01/30/2022 None Detected  NONE DETECTED (Cut Off Level 300 ng/mL) Final  . POC Methadone UR 01/30/2022 None Detected  NONE DETECTED (Cut Off Level 300 ng/mL) Final  . POC Oxycodone UR 01/30/2022 None Detected  NONE DETECTED (Cut Off Level 100 ng/mL) Final  . POC Marijuana UR 01/30/2022 None Detected  NONE DETECTED (Cut Off Level 50 ng/mL) Final  . SARSCOV2ONAVIRUS 2 AG 01/30/2022 NEGATIVE  NEGATIVE Final   Comment: (NOTE) SARS-CoV-2 antigen NOT DETECTED.   Negative results are presumptive.  Negative results do not preclude SARS-CoV-2 infection and should not be used as the sole basis for treatment or other patient management decisions, including infection  control decisions, particularly in the presence of clinical signs and  symptoms consistent with COVID-19, or in those who have been in contact with the virus.  Negative results must be combined with clinical observations, patient history, and epidemiological information. The expected result is Negative.  Fact Sheet for Patients: HandmadeRecipes.com.cy  Fact Sheet for Healthcare Providers: FuneralLife.at  This test is not yet approved or cleared by the [place] FDA and  has been authorized for detection and/or diagnosis of SARS-CoV-2 by FDA under an Emergency Use Authorization (EUA).  This EUA will remain in effect (meaning this test can be used) for the duration of  the COV                          ID-19 declaration under Section 564(b)(1) of the Act, 21 U.S.C. section 360bbb-3(b)(1), unless the authorization  is terminated or revoked sooner.    Admission on 01/21/2022, Discharged on 01/22/2022  Component Date Value Ref Range Status  . Sodium 01/21/2022 140  135 - 145 mmol/L Final  . Potassium 01/21/2022 3.9  3.5 - 5.1 mmol/L Final  . Chloride 01/21/2022 110  98 - 111 mmol/L Final  . CO2 01/21/2022 19 (L)  22 - 32 mmol/L Final  . Glucose, Bld 01/21/2022 98  70 - 99 mg/dL Final   Glucose reference range applies only to samples taken after fasting for at least 8 hours.  . BUN 01/21/2022 7  6 - 20 mg/dL Final  . Creatinine, Ser 01/21/2022 0.60  0.44 - 1.00 mg/dL Final  . Calcium 01/21/2022 9.2  8.9 - 10.3 mg/dL Final  . GFR, Estimated 01/21/2022 >60  >60 mL/min Final   Comment: (NOTE) Calculated  using the CKD-EPI Creatinine Equation (2021)   . Anion gap 01/21/2022 11  5 - 15 Final   Performed at [facility] [address], [place], [place] [address]  . WBC 01/21/2022 6.7  4.0 - 10.5 K/uL Final  . RBC 01/21/2022 4.94  3.87 - 5.11 MIL/uL Final  . Hemoglobin 01/21/2022 12.7  12.0 - 15.0 g/dL Final  . HCT 01/21/2022 40.9  36.0 - 46.0 % Final  . MCV 01/21/2022 82.8  80.0 - 100.0 fL Final  . MCH 01/21/2022 25.7 (L)  26.0 - 34.0 pg Final  . MCHC 01/21/2022 31.1  30.0 - 36.0 g/dL Final  . RDW 01/21/2022 13.7  11.5 - 15.5 % Final  . Platelets 01/21/2022 328  150 - 400 K/uL Final  . nRBC 01/21/2022 0.0  0.0 - 0.2 % Final   Performed at [facility], [place] [address], [place], [place] [address]  . Troponin I (High Sensitivity) 01/21/2022 <2  <18 ng/L Final   Comment: (NOTE) Elevated high sensitivity troponin I (hsTnI) values and significant  changes across serial measurements may suggest ACS but many other  chronic and acute conditions are known to elevate hsTnI results.  Refer to the "Links" section for chest pain algorithms and additional  guidance. Performed at [facility], [place] [address], [place], [place] [address]   . I-stat hCG, quantitative 01/21/2022 <5.0  <5 mIU/mL  Final  . Comment 3 01/21/2022          Final   Comment:   GEST. AGE      CONC.  (mIU/mL)   <=1 WEEK        5 - 50     2 WEEKS       50 - 500     3 WEEKS       100 - 10,000     4 WEEKS     1,000 - 30,000        FEMALE AND NON-PREGNANT FEMALE:     LESS THAN 5 mIU/mL   . Troponin I (High Sensitivity) 01/22/2022 <2  <18 ng/L Final   Comment: (NOTE) Elevated high sensitivity troponin I (hsTnI) values and significant  changes across serial measurements may suggest ACS but many other  chronic and acute conditions are known to elevate hsTnI results.  Refer to the "Links" section for chest pain algorithms and additional  guidance. Performed at [facility], [place] [address], [place],  [address]   Admission on 01/18/2022, Discharged on 01/18/2022  Component Date Value Ref Range Status  . WBC 01/18/2022 7.1  4.0 - 10.5 K/uL Final  . RBC 01/18/2022 4.59  3.87 - 5.11 MIL/uL Final  . Hemoglobin 01/18/2022 11.6 (L)  12.0 - 15.0 g/dL Final  . HCT 01/18/2022 37.2  36.0 - 46.0 % Final  . MCV 01/18/2022 81.0  80.0 - 100.0 fL Final  . MCH 01/18/2022 25.3 (L)  26.0 - 34.0 pg Final  . MCHC 01/18/2022 31.2  30.0 - 36.0 g/dL Final  . RDW 01/18/2022 14.2  11.5 - 15.5 % Final  . Platelets 01/18/2022 293  150 - 400 K/uL Final  . nRBC 01/18/2022 0.0  0.0 - 0.2 % Final  . Neutrophils Relative % 01/18/2022 82  % Final  . Neutro Abs 01/18/2022 5.8  1.7 - 7.7 K/uL Final  . Lymphocytes Relative 01/18/2022 10  % Final  . Lymphs Abs 01/18/2022 0.7  0.7 - 4.0 K/uL Final  . Monocytes  Relative 01/18/2022 8  % Final  . Monocytes Absolute 01/18/2022 0.5  0.1 - 1.0 K/uL Final  . Eosinophils Relative 01/18/2022 0  % Final  . Eosinophils Absolute 01/18/2022 0.0  0.0 - 0.5 K/uL Final  . Basophils Relative 01/18/2022 0  % Final  . Basophils Absolute 01/18/2022 0.0  0.0 - 0.1 K/uL Final  . Immature Granulocytes 01/18/2022 0  % Final  . Abs Immature Granulocytes 01/18/2022 0.03  0.00 - 0.07 K/uL Final    Performed at [facility], [place] [address], [place], [place] [address]  . Sodium 01/18/2022 138  135 - 145 mmol/L Final  . Potassium 01/18/2022 3.6  3.5 - 5.1 mmol/L Final  . Chloride 01/18/2022 107  98 - 111 mmol/L Final  . CO2 01/18/2022 21 (L)  22 - 32 mmol/L Final  . Glucose, Bld 01/18/2022 103 (H)  70 - 99 mg/dL Final   Glucose reference range applies only to samples taken after fasting for at least 8 hours.  . BUN 01/18/2022 10  6 - 20 mg/dL Final  . Creatinine, Ser 01/18/2022 0.79  0.44 - 1.00 mg/dL Final  . Calcium 01/18/2022 9.0  8.9 - 10.3 mg/dL Final  . Total Protein 01/18/2022 7.1  6.5 - 8.1 g/dL Final  . Albumin 01/18/2022 3.7  3.5 - 5.0 g/dL Final  . AST 01/18/2022 27  15 - 41 U/L Final  . ALT 01/18/2022 19  0 - 44 U/L Final  . Alkaline Phosphatase 01/18/2022 74  38 - 126 U/L Final  . Total Bilirubin 01/18/2022 0.3  0.3 - 1.2 mg/dL Final  . GFR, Estimated 01/18/2022 >60  >60 mL/min Final   Comment: (NOTE) Calculated using the CKD-EPI Creatinine Equation (2021)   . Anion gap 01/18/2022 10  5 - 15 Final   Performed at [facility], [place] [address], [place], [place] [address]  . SARS Coronavirus 2 by RT PCR 01/18/2022 POSITIVE (A)  NEGATIVE Final   Comment: (NOTE) SARS-CoV-2 target nucleic acids are DETECTED.  The SARS-CoV-2 RNA is generally detectable in upper respiratory specimens during the acute phase of infection. Positive results are indicative of the presence of the identified virus, but do not rule out bacterial infection or co-infection with other pathogens not detected by the test. Clinical correlation with patient history and other diagnostic information is necessary to determine patient infection status. The expected result is Negative.  Fact Sheet for Patients: EntrepreneurPulse.com.au  Fact Sheet for Healthcare Providers: IncredibleEmployment.be  This test is not yet approved or cleared by the [place]  FDA and  has been authorized for detection and/or diagnosis of SARS-CoV-2 by FDA under an Emergency Use Authorization (EUA).  This EUA will remain in effect (meaning this test can be used) for the duration of  the COVID-19 declaration under Section 564(b)(1) of the A                          ct, 21 U.S.C. section 360bbb-3(b)(1), unless the authorization is terminated or revoked sooner.    . Influenza A by PCR 01/18/2022 NEGATIVE  NEGATIVE Final  . Influenza B by PCR 01/18/2022 NEGATIVE  NEGATIVE Final   Comment: (NOTE) The Xpert Xpress SARS-CoV-2/FLU/RSV plus assay is intended as an aid in the diagnosis of influenza from Nasopharyngeal swab specimens and should not be used as a sole basis for treatment. Nasal washings and aspirates are unacceptable for Xpert Xpress SARS-CoV-2/FLU/RSV testing.  Fact Sheet for  Patients: EntrepreneurPulse.com.au  Fact Sheet for Healthcare Providers: IncredibleEmployment.be  This test is not yet approved or cleared by the [place] FDA and has been authorized for detection and/or diagnosis of SARS-CoV-2 by FDA under an Emergency Use Authorization (EUA). This EUA will remain in effect (meaning this test can be used) for the duration of the COVID-19 declaration under Section 564(b)(1) of the Act, 21 U.S.C. section 360bbb-3(b)(1), unless the authorization is terminated or revoked.    Marland Kitchen Resp Syncytial Virus by PCR 01/18/2022 NEGATIVE  NEGATIVE Final   Comment: (NOTE) Fact Sheet for Patients: EntrepreneurPulse.com.au  Fact Sheet for Healthcare Providers: IncredibleEmployment.be  This test is not yet approved or cleared by the [place] FDA and has been authorized for detection and/or diagnosis of SARS-CoV-2 by FDA under an Emergency Use Authorization (EUA). This EUA will remain in effect (meaning this test can be used) for the duration of the COVID-19 declaration under  Section 564(b)(1) of the Act, 21 U.S.C. section 360bbb-3(b)(1), unless the authorization is terminated or revoked.  Performed at [facility], [place] [address], [place], [place] [address]   . Lipase 01/18/2022 29  11 - 51 U/L Final   Performed at [facility], [name] [address] [place], [place] [address]  Admission on 12/27/2021, Discharged on 01/01/2022  Component Date Value Ref Range Status  . SARS Coronavirus 2 by RT PCR 12/28/2021 NEGATIVE  NEGATIVE Final   Comment: (NOTE) SARS-CoV-2 target nucleic acids are NOT DETECTED.  The SARS-CoV-2 RNA is generally detectable in upper respiratory specimens during the acute phase of infection. The lowest concentration of SARS-CoV-2 viral copies this assay can detect is 138 copies/mL. A negative result does not preclude SARS-Cov-2 infection and should not be used as the sole basis for treatment or other patient management decisions. A negative result may occur with  improper specimen collection/handling, submission of specimen other than nasopharyngeal swab, presence of viral mutation(s) within the areas targeted by this assay, and inadequate number of viral copies(<138 copies/mL). A negative result must be combined with clinical observations, patient history, and epidemiological information. The expected result is Negative.  Fact Sheet for Patients:  EntrepreneurPulse.com.au  Fact Sheet for Healthcare Providers:  IncredibleEmployment.be  This test is no                          t yet approved or cleared by the [place] FDA and  has been authorized for detection and/or diagnosis of SARS-CoV-2 by FDA under an Emergency Use Authorization (EUA). This EUA will remain  in effect (meaning this test can be used) for the duration of the COVID-19 declaration under Section 564(b)(1) of the Act, 21 U.S.C.section 360bbb-3(b)(1), unless the authorization is terminated  or revoked sooner.      .  Influenza A by PCR 12/28/2021 NEGATIVE  NEGATIVE Final  . Influenza B by PCR 12/28/2021 NEGATIVE  NEGATIVE Final   Comment: (NOTE) The Xpert Xpress SARS-CoV-2/FLU/RSV plus assay is intended as an aid in the diagnosis of influenza from Nasopharyngeal swab specimens and should not be used as a sole basis for treatment. Nasal washings and aspirates are unacceptable for Xpert Xpress SARS-CoV-2/FLU/RSV testing.  Fact Sheet for Patients: EntrepreneurPulse.com.au  Fact Sheet for Healthcare Providers: IncredibleEmployment.be  This test is not yet approved or cleared by the [place] FDA and has been authorized for detection and/or diagnosis of SARS-CoV-2 by FDA under an Emergency Use Authorization (EUA). This EUA will remain in effect (  meaning this test can be used) for the duration of the COVID-19 declaration under Section 564(b)(1) of the Act, 21 U.S.C. section 360bbb-3(b)(1), unless the authorization is terminated or revoked.  Performed at [facility], [place] [address] [place], [place] [address]   . Sodium 12/28/2021 142  135 - 145 mmol/L Final  . Potassium 12/28/2021 3.8  3.5 - 5.1 mmol/L Final  . Chloride 12/28/2021 110  98 - 111 mmol/L Final  . BUN 12/28/2021 <3 (L)  6 - 20 mg/dL Final  . Creatinine, Ser 12/28/2021 0.60  0.44 - 1.00 mg/dL Final  . Glucose, Bld 12/28/2021 91  70 - 99 mg/dL Final   Glucose reference range applies only to samples taken after fasting for at least 8 hours.  . Calcium, Ion 12/28/2021 1.12 (L)  1.15 - 1.40 mmol/L Final  . TCO2 12/28/2021 21 (L)  22 - 32 mmol/L Final  . Hemoglobin 12/28/2021 10.5 (L)  12.0 - 15.0 g/dL Final  . HCT 12/28/2021 31.0 (L)  36.0 - 46.0 % Final  . I-stat hCG, quantitative 12/28/2021 <5.0  <5 mIU/mL Final  . Comment 3 12/28/2021          Final   Comment:   GEST. AGE      CONC.  (mIU/mL)   <=1 WEEK        5 - 50     2 WEEKS       50 - 500     3 WEEKS       100 - 10,000     4 WEEKS      1,000 - 30,000        FEMALE AND NON-PREGNANT FEMALE:     LESS THAN 5 mIU/mL   . Sodium 12/28/2021 141  135 - 145 mmol/L Final  . Potassium 12/28/2021 3.7  3.5 - 5.1 mmol/L Final  . Chloride 12/28/2021 112 (H)  98 - 111 mmol/L Final  . CO2 12/28/2021 21 (L)  22 - 32 mmol/L Final  . Glucose, Bld 12/28/2021 93  70 - 99 mg/dL Final   Glucose reference range applies only to samples taken after fasting for at least 8 hours.  . BUN 12/28/2021 <5 (L)  6 - 20 mg/dL Final  . Creatinine, Ser 12/28/2021 0.70  0.44 - 1.00 mg/dL Final  . Calcium 12/28/2021 8.1 (L)  8.9 - 10.3 mg/dL Final  . Total Protein 12/28/2021 5.6 (L)  6.5 - 8.1 g/dL Final  . Albumin 12/28/2021 3.2 (L)  3.5 - 5.0 g/dL Final  . AST 12/28/2021 15  15 - 41 U/L Final  . ALT 12/28/2021 12  0 - 44 U/L Final  . Alkaline Phosphatase 12/28/2021 56  38 - 126 U/L Final  . Total Bilirubin 12/28/2021 0.3  0.3 - 1.2 mg/dL Final  . GFR, Estimated 12/28/2021 >60  >60 mL/min Final   Comment: (NOTE) Calculated using the CKD-EPI Creatinine Equation (2021)   . Anion gap 12/28/2021 8  5 - 15 Final   Performed at [facility] [address] [place], [place] [address]  . WBC 12/28/2021 6.8  4.0 - 10.5 K/uL Final  . RBC 12/28/2021 4.16  3.87 - 5.11 MIL/uL Final  . Hemoglobin 12/28/2021 10.6 (L)  12.0 - 15.0 g/dL Final  . HCT 12/28/2021 34.7 (L)  36.0 - 46.0 % Final  . MCV 12/28/2021 83.4  80.0 - 100.0 fL Final  . MCH 12/28/2021 25.5 (L)  26.0 - 34.0 pg Final  .  MCHC 12/28/2021 30.5  30.0 - 36.0 g/dL Final  . RDW 12/28/2021 13.5  11.5 - 15.5 % Final  . Platelets 12/28/2021 142 (L)  150 - 400 K/uL Final   REPEATED TO VERIFY  . nRBC 12/28/2021 0.0  0.0 - 0.2 % Final  . Neutrophils Relative % 12/28/2021 52  % Final  . Neutro Abs 12/28/2021 3.5  1.7 - 7.7 K/uL Final  . Lymphocytes Relative 12/28/2021 41  % Final  . Lymphs Abs 12/28/2021 2.8  0.7 - 4.0 K/uL Final  . Monocytes Relative 12/28/2021 6  % Final  . Monocytes Absolute  12/28/2021 0.4  0.1 - 1.0 K/uL Final  . Eosinophils Relative 12/28/2021 1  % Final  . Eosinophils Absolute 12/28/2021 0.0  0.0 - 0.5 K/uL Final  . Basophils Relative 12/28/2021 0  % Final  . Basophils Absolute 12/28/2021 0.0  0.0 - 0.1 K/uL Final  . Immature Granulocytes 12/28/2021 0  % Final  . Abs Immature Granulocytes 12/28/2021 0.02  0.00 - 0.07 K/uL Final   Performed at [facility], [place] [address] [place], [place] [address]  . Color, Urine 12/28/2021 YELLOW  YELLOW Final  . APPearance 12/28/2021 CLEAR  CLEAR Final  . Specific Gravity, Urine 12/28/2021 1.006  1.005 - 1.030 Final  . pH 12/28/2021 8.0  5.0 - 8.0 Final  . Glucose, UA 12/28/2021 NEGATIVE  NEGATIVE mg/dL Final  . Hgb urine dipstick 12/28/2021 NEGATIVE  NEGATIVE Final  . Bilirubin Urine 12/28/2021 NEGATIVE  NEGATIVE Final  . Ketones, ur 12/28/2021 NEGATIVE  NEGATIVE mg/dL Final  . Protein, ur 12/28/2021 NEGATIVE  NEGATIVE mg/dL Final  . Nitrite 12/28/2021 NEGATIVE  NEGATIVE Final  . Chalmers Guest 12/28/2021 SMALL (A)  NEGATIVE Final  . RBC / HPF 12/28/2021 0-5  0 - 5 RBC/hpf Final  . WBC, UA 12/28/2021 0-5  0 - 5 WBC/hpf Final  . Bacteria, UA 12/28/2021 RARE (A)  NONE SEEN Final  . Squamous Epithelial / HPF 12/28/2021 6-10  0 - 5 Final   Performed at [facility], [place] [address], [place], [place] [address]  . Salicylate Lvl 84/16/6063 <7.0 (L)  7.0 - 30.0 mg/dL Final   Performed at [facility] [address], [place], [place] [address]  . Acetaminophen (Tylenol), Serum 12/28/2021 <10 (L)  10 - 30 ug/mL Final   Comment: (NOTE) Therapeutic concentrations vary significantly. A range of 10-30 ug/mL  may be an effective concentration for many patients. However, some  are best treated at concentrations outside of this range. Acetaminophen concentrations >150 ug/mL at 4 hours after ingestion  and >50 ug/mL at 12 hours after ingestion are often associated with  toxic reactions.  Performed at [facility], [place] [address] [place], [place] [address]   . Opiates 12/28/2021 NONE DETECTED  NONE DETECTED Final  . Cocaine 12/28/2021 NONE DETECTED  NONE DETECTED Final  . Benzodiazepines 12/28/2021 NONE DETECTED  NONE DETECTED Final  . Amphetamines 12/28/2021 NONE DETECTED  NONE DETECTED Final  . Tetrahydrocannabinol 12/28/2021 NONE DETECTED  NONE DETECTED Final  . Barbiturates 12/28/2021 NONE DETECTED  NONE DETECTED Final   Comment: (NOTE) DRUG SCREEN FOR MEDICAL PURPOSES ONLY.  IF CONFIRMATION IS NEEDED FOR ANY PURPOSE, NOTIFY LAB WITHIN 5 DAYS.  LOWEST DETECTABLE LIMITS FOR URINE DRUG SCREEN Drug Class                     Cutoff (ng/mL) Amphetamine and metabolites    1000  Barbiturate and metabolites    200 Benzodiazepine                 200 Opiates and metabolites        300 Cocaine and metabolites        300 THC                            50 Performed at [facility], [place] [address] [place], [place] [address]   . WBC 12/28/2021 8.5  4.0 - 10.5 K/uL Final  . RBC 12/28/2021 4.31  3.87 - 5.11 MIL/uL Final  . Hemoglobin 12/28/2021 11.2 (L)  12.0 - 15.0 g/dL Final  . HCT 12/28/2021 36.0  36.0 - 46.0 % Final  . MCV 12/28/2021 83.5  80.0 - 100.0 fL Final  . MCH 12/28/2021 26.0  26.0 - 34.0 pg Final  . MCHC 12/28/2021 31.1  30.0 - 36.0 g/dL Final  . RDW 12/28/2021 13.6  11.5 - 15.5 % Final  . Platelets 12/28/2021 262  150 - 400 K/uL Final  . nRBC 12/28/2021 0.0  0.0 - 0.2 % Final   Performed at [facility] [address], [place], [place] [address]  . Sodium 12/28/2021 141  135 - 145 mmol/L Final  . Potassium 12/28/2021 3.9  3.5 - 5.1 mmol/L Final  . Chloride 12/28/2021 110  98 - 111 mmol/L Final  . CO2 12/28/2021 19 (L)  22 - 32 mmol/L Final  . Glucose, Bld 12/28/2021 103 (H)  70 - 99 mg/dL Final   Glucose reference range applies only to samples taken after fasting for at least 8 hours.  . BUN 12/28/2021 <5 (L)  6 - 20 mg/dL Final  . Creatinine, Ser 12/28/2021  0.73  0.44 - 1.00 mg/dL Final  . Calcium 12/28/2021 8.1 (L)  8.9 - 10.3 mg/dL Final  . GFR, Estimated 12/28/2021 >60  >60 mL/min Final   Comment: (NOTE) Calculated using the CKD-EPI Creatinine Equation (2021)   . Anion gap 12/28/2021 12  5 - 15 Final   Performed at [facility] [address], [place], [place] [address]  . Magnesium 12/28/2021 1.8  1.7 - 2.4 mg/dL Final   Performed at [facility] [address] [place], [place] [address]  . Phosphorus 12/28/2021 3.1  2.5 - 4.6 mg/dL Final   Performed at [facility] [address], [place], [place] [address]  . Total Protein 12/28/2021 5.9 (L)  6.5 - 8.1 g/dL Final  . Albumin 12/28/2021 3.3 (L)  3.5 - 5.0 g/dL Final  . AST 12/28/2021 17  15 - 41 U/L Final  . ALT 12/28/2021 11  0 - 44 U/L Final  . Alkaline Phosphatase 12/28/2021 57  38 - 126 U/L Final  . Total Bilirubin 12/28/2021 0.3  0.3 - 1.2 mg/dL Final  . Bilirubin, Direct 12/28/2021 <0.1  0.0 - 0.2 mg/dL Final  . Indirect Bilirubin 12/28/2021 NOT CALCULATED  0.3 - 0.9 mg/dL Final   Performed at [facility], [place] [address], [place], [place] [address]  . MRSA by PCR Next Gen 12/28/2021 NOT DETECTED  NOT DETECTED Final   Comment: (NOTE) The GeneXpert MRSA Assay (FDA approved for NASAL specimens only), is one component of a comprehensive MRSA colonization surveillance program. It is not intended to diagnose MRSA infection nor to guide or monitor treatment for MRSA infections. Test performance is not FDA approved  in patients less than 62 years old. Performed at [facility], [place] [address] [place], [place] [address]   . WBC 12/30/2021 10.9 (H)  4.0 - 10.5 K/uL Final  . RBC 12/30/2021 5.01  3.87 - 5.11 MIL/uL Final  . Hemoglobin 12/30/2021 13.1  12.0 - 15.0 g/dL Final  . HCT 12/30/2021 40.1  36.0 - 46.0 % Final  . MCV 12/30/2021 80.0  80.0 - 100.0 fL Final  . MCH 12/30/2021 26.1  26.0 - 34.0 pg Final  . MCHC 12/30/2021 32.7  30.0 - 36.0 g/dL  Final  . RDW 12/30/2021 13.9  11.5 - 15.5 % Final  . Platelets 12/30/2021 254  150 - 400 K/uL Final  . nRBC 12/30/2021 0.0  0.0 - 0.2 % Final   Performed at [facility], [place] [address], [place], [place] [address]  . Sodium 12/30/2021 136  135 - 145 mmol/L Final  . Potassium 12/30/2021 3.6  3.5 - 5.1 mmol/L Final  . Chloride 12/30/2021 105  98 - 111 mmol/L Final  . CO2 12/30/2021 18 (L)  22 - 32 mmol/L Final  . Glucose, Bld 12/30/2021 106 (H)  70 - 99 mg/dL Final   Glucose reference range applies only to samples taken after fasting for at least 8 hours.  . BUN 12/30/2021 10  6 - 20 mg/dL Final  . Creatinine, Ser 12/30/2021 0.70  0.44 - 1.00 mg/dL Final  . Calcium 12/30/2021 9.4  8.9 - 10.3 mg/dL Final  . GFR, Estimated 12/30/2021 >60  >60 mL/min Final   Comment: (NOTE) Calculated using the CKD-EPI Creatinine Equation (2021)   . Anion gap 12/30/2021 13  5 - 15 Final   Performed at [facility] [address] [place], [place] [address]  . Magnesium 12/30/2021 1.9  1.7 - 2.4 mg/dL Final   Performed at [facility] [address] [place], [place] [address]  . Phosphorus 12/30/2021 4.1  2.5 - 4.6 mg/dL Final   Performed at [facility] [address], [place], [place] [address]  . Total Protein 12/30/2021 7.7  6.5 - 8.1 g/dL Final  . Albumin 12/30/2021 4.3  3.5 - 5.0 g/dL Final  . AST 12/30/2021 19  15 - 41 U/L Final  . ALT 12/30/2021 11  0 - 44 U/L Final  . Alkaline Phosphatase 12/30/2021 65  38 - 126 U/L Final  . Total Bilirubin 12/30/2021 0.3  0.3 - 1.2 mg/dL Final  . Bilirubin, Direct 12/30/2021 <0.1  0.0 - 0.2 mg/dL Final  . Indirect Bilirubin 12/30/2021 NOT CALCULATED  0.3 - 0.9 mg/dL Final   Performed at [facility], [place] [address] [place], [place] [address]  . SARS Coronavirus 2 by RT PCR 12/30/2021 NEGATIVE  NEGATIVE Final   Comment: (NOTE) SARS-CoV-2 target nucleic acids are NOT DETECTED.  The SARS-CoV-2 RNA is generally detectable in  upper and lower respiratory specimens during the acute phase of infection. The lowest concentration of SARS-CoV-2 viral copies this assay can detect is 250 copies / mL. A negative result does not preclude SARS-CoV-2 infection and should not be used as the sole basis for treatment or other patient management decisions.  A negative result may occur with improper specimen collection / handling, submission of specimen other than nasopharyngeal swab, presence of viral mutation(s) within the areas targeted by this assay, and inadequate number of viral copies (<250 copies / mL). A negative result must be combined with clinical observations, patient  history, and epidemiological information.  Fact Sheet for Patients:   https://www.patel.info/  Fact Sheet for Healthcare Providers: https://hall.com/  This test is not yet approved or                           cleared by the [place] FDA and has been authorized for detection and/or diagnosis of SARS-CoV-2 by FDA under an Emergency Use Authorization (EUA).  This EUA will remain in effect (meaning this test can be used) for the duration of the COVID-19 declaration under Section 564(b)(1) of the Act, 21 U.S.C. section 360bbb-3(b)(1), unless the authorization is terminated or revoked sooner.  Performed at [facility], [place] [address], [place], [place] [address]   . WBC 01/01/2022 8.7  4.0 - 10.5 K/uL Final  . RBC 01/01/2022 4.72  3.87 - 5.11 MIL/uL Final  . Hemoglobin 01/01/2022 12.1  12.0 - 15.0 g/dL Final  . HCT 01/01/2022 37.6  36.0 - 46.0 % Final  . MCV 01/01/2022 79.7 (L)  80.0 - 100.0 fL Final  . MCH 01/01/2022 25.6 (L)  26.0 - 34.0 pg Final  . MCHC 01/01/2022 32.2  30.0 - 36.0 g/dL Final  . RDW 01/01/2022 13.8  11.5 - 15.5 % Final  . Platelets 01/01/2022 271  150 - 400 K/uL Final  . nRBC 01/01/2022 0.0  0.0 - 0.2 % Final   Performed at [facility], [place] [address],  [place], [place] [address]  . Sodium 01/01/2022 135  135 - 145 mmol/L Final  . Potassium 01/01/2022 3.4 (L)  3.5 - 5.1 mmol/L Final  . Chloride 01/01/2022 104  98 - 111 mmol/L Final  . CO2 01/01/2022 21 (L)  22 - 32 mmol/L Final  . Glucose, Bld 01/01/2022 106 (H)  70 - 99 mg/dL Final   Glucose reference range applies only to samples taken after fasting for at least 8 hours.  . BUN 01/01/2022 10  6 - 20 mg/dL Final  . Creatinine, Ser 01/01/2022 0.70  0.44 - 1.00 mg/dL Final  . Calcium 01/01/2022 9.2  8.9 - 10.3 mg/dL Final  . GFR, Estimated 01/01/2022 >60  >60 mL/min Final   Comment: (NOTE) Calculated using the CKD-EPI Creatinine Equation (2021)   . Anion gap 01/01/2022 10  5 - 15 Final   Performed at [facility], [place] [address], [place], [place] [address]  . Magnesium 01/01/2022 2.0  1.7 - 2.4 mg/dL Final   Performed at [facility] [address], [place], [place] [address]  . Phosphorus 01/01/2022 3.8  2.5 - 4.6 mg/dL Final   Performed at [facility] [address], [place], [place] [address]  . Total Protein 01/01/2022 7.5  6.5 - 8.1 g/dL Final  . Albumin 01/01/2022 4.0  3.5 - 5.0 g/dL Final  . AST 01/01/2022 18  15 - 41 U/L Final  . ALT 01/01/2022 14  0 - 44 U/L Final  . Alkaline Phosphatase 01/01/2022 57  38 - 126 U/L Final  . Total Bilirubin 01/01/2022 0.7  0.3 - 1.2 mg/dL Final  . Bilirubin, Direct 01/01/2022 <0.1  0.0 - 0.2 mg/dL Final  . Indirect Bilirubin 01/01/2022 NOT CALCULATED  0.3 - 0.9 mg/dL Final   Performed at [facility], [place] [address] [place], [place] [address]  . SARS Coronavirus 2 by RT PCR 01/01/2022 NEGATIVE  NEGATIVE Final   Comment: (NOTE) SARS-CoV-2 target nucleic acids are NOT  DETECTED.  The SARS-CoV-2 RNA is generally detectable in upper and lower respiratory specimens during the acute phase of infection. The lowest concentration of SARS-CoV-2 viral copies this assay can detect is 250 copies / mL. A negative result does not  preclude SARS-CoV-2 infection and should not be used as the sole basis for treatment or other patient management decisions.  A negative result may occur with improper specimen collection / handling, submission of specimen other than nasopharyngeal swab, presence of viral mutation(s) within the areas targeted by this assay, and inadequate number of viral copies (<250 copies / mL). A negative result must be combined with clinical observations, patient history, and epidemiological information.  Fact Sheet for Patients:   https://www.patel.info/  Fact Sheet for Healthcare Providers: https://hall.com/  This test is not yet approved or                           cleared by the [place] FDA and has been authorized for detection and/or diagnosis of SARS-CoV-2 by FDA under an Emergency Use Authorization (EUA).  This EUA will remain in effect (meaning this test can be used) for the duration of the COVID-19 declaration under Section 564(b)(1) of the Act, 21 U.S.C. section 360bbb-3(b)(1), unless the authorization is terminated or revoked sooner.  Performed at [facility], [place] [address] [place], [place] [address]   There may be more visits with results that are not included.    Allergies: Bee venom, Coconut flavor, Fish allergy, Geodon [ziprasidone hcl], Haloperidol and related, Lithobid [lithium], Roxicodone [oxycodone], Seroquel [quetiapine], Shellfish allergy, Phenergan [promethazine hcl], Prilosec [omeprazole], Sulfa antibiotics, Tegretol [carbamazepine], Prozac [fluoxetine], Tape, and Tylenol [acetaminophen]  Medications:  PTA Medications  Medication Sig  . albuterol (VENTOLIN HFA) 108 (90 Base) MCG/ACT inhaler Inhale 2 puffs into the lungs every 6 (six) hours as needed for wheezing or shortness of breath.  . budesonide-formoterol (SYMBICORT) 160-4.5 MCG/ACT inhaler Inhale 2 puffs into the lungs 2 (two) times daily.  . metoprolol  succinate (TOPROL-XL) 25 MG 24 hr tablet Take 0.5 tablets (12.5 mg total) by mouth daily for 14 days.  . prazosin (MINIPRESS) 1 MG capsule Take 1 capsule (1 mg total) by mouth at bedtime for 14 days.  Marland Kitchen escitalopram (LEXAPRO) 10 MG tablet Take 1 tablet (10 mg total) by mouth daily for 14 days.  . pantoprazole (PROTONIX) 40 MG tablet Take 1 tablet (40 mg total) by mouth daily for 14 days.  . pregabalin (LYRICA) 100 MG capsule Take 100 mg by mouth 3 (three) times daily.  . diclofenac Sodium (VOLTAREN) 1 % GEL Apply 2 g topically 4 (four) times daily as needed (For pain).  . hydrOXYzine (ATARAX) 25 MG tablet Take 25 mg by mouth 3 (three) times daily as needed for anxiety.  . ondansetron (ZOFRAN) 4 MG tablet Take 1 tablet (4 mg total) by mouth every 6 (six) hours.    Medical Decision Making  ***    Recommendations  Based on my evaluation the patient does not appear to have an emergency medical condition. Patient will be admitted to [facility] for crisis  management, safety and stabilization.   Lucia Bitter, NP 03/10/22  10:03 PM

## 2022-03-10 NOTE — ED Provider Notes (Signed)
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 THIS METHODOLOGY IS >24 mIU/mL   Admission on 02/15/2022, Discharged on 02/15/2022  Component Date Value Ref Range Status   Sodium 02/15/2022 136  135 - 145 mmol/L Final   Potassium 02/15/2022 4.3  3.5 - 5.1 mmol/L Final   Chloride 02/15/2022  103  98 - 111 mmol/L Final   CO2 02/15/2022 22  22 - 32 mmol/L Final   Glucose, Bld 02/15/2022 107 (H)  70 - 99 mg/dL Final   Glucose reference range applies only to samples taken after fasting for at least 8 hours.   BUN 02/15/2022 7  6 - 20 mg/dL Final   Creatinine, Ser 02/15/2022 0.66  0.44 - 1.00 mg/dL Final   Calcium 02/15/2022 9.1  8.9 - 10.3 mg/dL Final   GFR, Estimated 02/15/2022 >60  >60 mL/min Final   Comment: (NOTE) Calculated using the CKD-EPI Creatinine Equation (2021)    Anion gap 02/15/2022 11  5 - 15 Final   Performed at [facility], [place] [address], [place], [place] [address]   WBC 02/15/2022 10.2  4.0 - 10.5 K/uL Final   RBC 02/15/2022 5.24 (H)  3.87 - 5.11 MIL/uL Final   Hemoglobin 02/15/2022 13.3  12.0 - 15.0 g/dL Final   HCT 02/15/2022 43.0  36.0 - 46.0 % Final   MCV 02/15/2022 82.1  80.0 - 100.0 fL Final   MCH 02/15/2022 25.4 (L)  26.0 - 34.0 pg Final   MCHC 02/15/2022 30.9  30.0 - 36.0 g/dL Final   RDW 02/15/2022 13.9  11.5 - 15.5 % Final   Platelets 02/15/2022 329  150 - 400 K/uL Final   nRBC 02/15/2022 0.0  0.0 - 0.2 % Final   Performed at [facility] [address] [place], [place] [address]   Color, Urine 02/15/2022 YELLOW  YELLOW Final   APPearance 02/15/2022 CLEAR  CLEAR Final  Specific Gravity, Urine 02/15/2022 1.009  1.005 - 1.030 Final   pH 02/15/2022 6.0  5.0 - 8.0 Final   Glucose, UA 02/15/2022 NEGATIVE  NEGATIVE mg/dL Final   Hgb urine dipstick 02/15/2022 NEGATIVE  NEGATIVE Final   Bilirubin Urine 02/15/2022 NEGATIVE  NEGATIVE Final   Ketones, ur 02/15/2022 NEGATIVE  NEGATIVE mg/dL Final   Protein, ur 02/15/2022 NEGATIVE  NEGATIVE mg/dL Final   Nitrite 02/15/2022 NEGATIVE  NEGATIVE Final   Leukocytes,Ua 02/15/2022 NEGATIVE  NEGATIVE Final   Performed at [facility], [place] [address], [place], [place] [address]   I-stat hCG, quantitative 02/15/2022 <5.0  <5 mIU/mL Final   Comment 3 02/15/2022          Final   Comment:    GEST. AGE      CONC.  (mIU/mL)   <=1 WEEK        5 - 50     2 WEEKS       50 - 500     3 WEEKS       100 - 10,000     4 WEEKS     1,000 - 30,000        FEMALE AND NON-PREGNANT FEMALE:     LESS THAN 5 mIU/mL   Admission on 02/10/2022, Discharged on 02/11/2022  Component Date Value Ref Range Status   SARS Coronavirus 2 by RT PCR 02/10/2022 NEGATIVE  NEGATIVE Final   Comment: (NOTE) SARS-CoV-2 target nucleic acids are NOT DETECTED.  The SARS-CoV-2 RNA is generally detectable in upper respiratory specimens during the acute phase of infection. The lowest concentration of SARS-CoV-2 viral copies this assay can detect is 138 copies/mL. A negative result does not preclude SARS-Cov-2 infection and should not be used as the sole basis for treatment or other patient management decisions. A negative result may occur with  improper specimen collection/handling, submission of specimen other than nasopharyngeal swab, presence of viral mutation(s) within the areas targeted by this assay, and inadequate number of viral copies(<138 copies/mL). A negative result must be combined with clinical observations, patient history, and epidemiological information. The expected result is Negative.  Fact Sheet for Patients:  EntrepreneurPulse.com.au  Fact Sheet for Healthcare Providers:  IncredibleEmployment.be  This test is no                          t yet approved or cleared by the [place] FDA and  has been authorized for detection and/or diagnosis of SARS-CoV-2 by FDA under an Emergency Use Authorization (EUA). This EUA will remain  in effect (meaning this test can be used) for the duration of the COVID-19 declaration under Section 564(b)(1) of the Act, 21 U.S.C.section 360bbb-3(b)(1), unless the authorization is terminated  or revoked sooner.       Influenza A by PCR 02/10/2022 NEGATIVE  NEGATIVE Final   Influenza B by PCR 02/10/2022 NEGATIVE  NEGATIVE  Final   Comment: (NOTE) The Xpert Xpress SARS-CoV-2/FLU/RSV plus assay is intended as an aid in the diagnosis of influenza from Nasopharyngeal swab specimens and should not be used as a sole basis for treatment. Nasal washings and aspirates are unacceptable for Xpert Xpress SARS-CoV-2/FLU/RSV testing.  Fact Sheet for Patients: EntrepreneurPulse.com.au  Fact Sheet for Healthcare Providers: IncredibleEmployment.be  This test is not yet approved or cleared by the [place] FDA and has been authorized for detection and/or diagnosis of SARS-CoV-2 by FDA under an Emergency Use Authorization (EUA). This EUA will remain  in effect (meaning this test can be used) for the duration of the COVID-19 declaration under Section 564(b)(1) of the Act, 21 U.S.C. section 360bbb-3(b)(1), unless the authorization is terminated or revoked.     Resp Syncytial Virus by PCR 02/10/2022 NEGATIVE  NEGATIVE Final   Comment: (NOTE) Fact Sheet for Patients: EntrepreneurPulse.com.au  Fact Sheet for Healthcare Providers: IncredibleEmployment.be  This test is not yet approved or cleared by the [place] FDA and has been authorized for detection and/or diagnosis of SARS-CoV-2 by FDA under an Emergency Use Authorization (EUA). This EUA will remain in effect (meaning this test can be used) for the duration of the COVID-19 declaration under Section 564(b)(1) of the Act, 21 U.S.C. section 360bbb-3(b)(1), unless the authorization is terminated or revoked.  Performed at [facility], [place] [address], [place], [place] [address]    Preg Test, Ur 02/10/2022 Negative  Negative Preliminary   POC Amphetamine UR 02/10/2022 None Detected  NONE DETECTED (Cut Off Level 1000 ng/mL) Preliminary   POC Secobarbital (BAR) 02/10/2022 None Detected  NONE DETECTED (Cut Off Level 300 ng/mL) Preliminary   POC Buprenorphine (BUP) 02/10/2022 None  Detected  NONE DETECTED (Cut Off Level 10 ng/mL) Preliminary   POC Oxazepam (BZO) 02/10/2022 None Detected  NONE DETECTED (Cut Off Level 300 ng/mL) Preliminary   POC Cocaine UR 02/10/2022 None Detected  NONE DETECTED (Cut Off Level 300 ng/mL) Preliminary   POC Methamphetamine UR 02/10/2022 None Detected (A)  NONE DETECTED (Cut Off Level 1000 ng/mL) Preliminary   POC Morphine 02/10/2022 None Detected  NONE DETECTED (Cut Off Level 300 ng/mL) Preliminary   POC Methadone UR 02/10/2022 None Detected  NONE DETECTED (Cut Off Level 300 ng/mL) Preliminary   POC Oxycodone UR 02/10/2022 None Detected  NONE DETECTED (Cut Off Level 100 ng/mL) Preliminary   POC Marijuana UR 02/10/2022 None Detected  NONE DETECTED (Cut Off Level 50 ng/mL) Preliminary   SARSCOV2ONAVIRUS 2 AG 02/10/2022 NEGATIVE  NEGATIVE Final   Comment: (NOTE) SARS-CoV-2 antigen NOT DETECTED.   Negative results are presumptive.  Negative results do not preclude SARS-CoV-2 infection and should not be used as the sole basis for treatment or other patient management decisions, including infection  control decisions, particularly in the presence of clinical signs and  symptoms consistent with COVID-19, or in those who have been in contact with the virus.  Negative results must be combined with clinical observations, patient history, and epidemiological information. The expected result is Negative.  Fact Sheet for Patients: HandmadeRecipes.com.cy  Fact Sheet for Healthcare Providers: FuneralLife.at  This test is not yet approved or cleared by the [place] FDA and  has been authorized for detection and/or diagnosis of SARS-CoV-2 by FDA under an Emergency Use Authorization (EUA).  This EUA will remain in effect (meaning this test can be used) for the duration of  the COV                          ID-19 declaration under Section 564(b)(1) of the Act, 21 U.S.C. section 360bbb-3(b)(1), unless  the authorization is terminated or revoked sooner.     Preg Test, Ur 02/10/2022 NEGATIVE  NEGATIVE Final   Comment:        THE SENSITIVITY OF THIS METHODOLOGY IS >24 mIU/mL   Admission on 01/30/2022, Discharged on 01/31/2022  Component Date Value Ref Range Status   SARS Coronavirus 2 by RT PCR 01/30/2022 NEGATIVE  NEGATIVE Final   Comment: (NOTE) SARS-CoV-2 target nucleic acids  are NOT DETECTED.  The SARS-CoV-2 RNA is generally detectable in upper respiratory specimens during the acute phase of infection. The lowest concentration of SARS-CoV-2 viral copies this assay can detect is 138 copies/mL. A negative result does not preclude SARS-Cov-2 infection and should not be used as the sole basis for treatment or other patient management decisions. A negative result may occur with  improper specimen collection/handling, submission of specimen other than nasopharyngeal swab, presence of viral mutation(s) within the areas targeted by this assay, and inadequate number of viral copies(<138 copies/mL). A negative result must be combined with clinical observations, patient history, and epidemiological information. The expected result is Negative.  Fact Sheet for Patients:  EntrepreneurPulse.com.au  Fact Sheet for Healthcare Providers:  IncredibleEmployment.be  This test is no                          t yet approved or cleared by the [place] FDA and  has been authorized for detection and/or diagnosis of SARS-CoV-2 by FDA under an Emergency Use Authorization (EUA). This EUA will remain  in effect (meaning this test can be used) for the duration of the COVID-19 declaration under Section 564(b)(1) of the Act, 21 U.S.C.section 360bbb-3(b)(1), unless the authorization is terminated  or revoked sooner.       Influenza A by PCR 01/30/2022 NEGATIVE  NEGATIVE Final   Influenza B by PCR 01/30/2022 NEGATIVE  NEGATIVE Final   Comment: (NOTE) The Xpert  Xpress SARS-CoV-2/FLU/RSV plus assay is intended as an aid in the diagnosis of influenza from Nasopharyngeal swab specimens and should not be used as a sole basis for treatment. Nasal washings and aspirates are unacceptable for Xpert Xpress SARS-CoV-2/FLU/RSV testing.  Fact Sheet for Patients: EntrepreneurPulse.com.au  Fact Sheet for Healthcare Providers: IncredibleEmployment.be  This test is not yet approved or cleared by the [place] FDA and has been authorized for detection and/or diagnosis of SARS-CoV-2 by FDA under an Emergency Use Authorization (EUA). This EUA will remain in effect (meaning this test can be used) for the duration of the COVID-19 declaration under Section 564(b)(1) of the Act, 21 U.S.C. section 360bbb-3(b)(1), unless the authorization is terminated or revoked.     Resp Syncytial Virus by PCR 01/30/2022 NEGATIVE  NEGATIVE Final   Comment: (NOTE) Fact Sheet for Patients: EntrepreneurPulse.com.au  Fact Sheet for Healthcare Providers: IncredibleEmployment.be  This test is not yet approved or cleared by the [place] FDA and has been authorized for detection and/or diagnosis of SARS-CoV-2 by FDA under an Emergency Use Authorization (EUA). This EUA will remain in effect (meaning this test can be used) for the duration of the COVID-19 declaration under Section 564(b)(1) of the Act, 21 U.S.C. section 360bbb-3(b)(1), unless the authorization is terminated or revoked.  Performed at [facility], [place] [address] [place], [place] [address]    WBC 01/30/2022 12.5 (H)  4.0 - 10.5 K/uL Final   RBC 01/30/2022 5.25 (H)  3.87 - 5.11 MIL/uL Final   Hemoglobin 01/30/2022 13.4  12.0 - 15.0 g/dL Final   HCT 01/30/2022 42.8  36.0 - 46.0 % Final   MCV 01/30/2022 81.5  80.0 - 100.0 fL Final   MCH 01/30/2022 25.5 (L)  26.0 - 34.0 pg Final   MCHC 01/30/2022 31.3  30.0 - 36.0 g/dL Final   RDW  01/30/2022 14.0  11.5 - 15.5 % Final   Platelets 01/30/2022 293  150 - 400 K/uL Final   nRBC 01/30/2022 0.0  0.0 - 0.2 % Final   Neutrophils Relative % 01/30/2022 66  % Final   Neutro Abs 01/30/2022 8.2 (H)  1.7 - 7.7 K/uL Final   Lymphocytes Relative 01/30/2022 28  % Final   Lymphs Abs 01/30/2022 3.5  0.7 - 4.0 K/uL Final   Monocytes Relative 01/30/2022 5  % Final   Monocytes Absolute 01/30/2022 0.6  0.1 - 1.0 K/uL Final   Eosinophils Relative 01/30/2022 1  % Final   Eosinophils Absolute 01/30/2022 0.1  0.0 - 0.5 K/uL Final   Basophils Relative 01/30/2022 0  % Final   Basophils Absolute 01/30/2022 0.0  0.0 - 0.1 K/uL Final   Immature Granulocytes 01/30/2022 0  % Final   Abs Immature Granulocytes 01/30/2022 0.04  0.00 - 0.07 K/uL Final   Performed at [facility], [place] [address], [place], [place] [address]   Sodium 01/30/2022 139  135 - 145 mmol/L Final   Potassium 01/30/2022 3.8  3.5 - 5.1 mmol/L Final   Chloride 01/30/2022 108  98 - 111 mmol/L Final   CO2 01/30/2022 23  22 - 32 mmol/L Final   Glucose, Bld 01/30/2022 92  70 - 99 mg/dL Final   Glucose reference range applies only to samples taken after fasting for at least 8 hours.   BUN 01/30/2022 14  6 - 20 mg/dL Final   Creatinine, Ser 01/30/2022 0.92  0.44 - 1.00 mg/dL Final   Calcium 01/30/2022 9.7  8.9 - 10.3 mg/dL Final   Total Protein 01/30/2022 8.2 (H)  6.5 - 8.1 g/dL Final   Albumin 01/30/2022 4.6  3.5 - 5.0 g/dL Final   AST 01/30/2022 18  15 - 41 U/L Final   ALT 01/30/2022 15  0 - 44 U/L Final   Alkaline Phosphatase 01/30/2022 65  38 - 126 U/L Final   Total Bilirubin 01/30/2022 0.4  0.3 - 1.2 mg/dL Final   GFR, Estimated 01/30/2022 >60  >60 mL/min Final   Comment: (NOTE) Calculated using the CKD-EPI Creatinine Equation (2021)    Anion gap 01/30/2022 8  5 - 15 Final   Performed at [facility] [address], [place], [place] [address]   Preg Test, Ur 01/30/2022 Negative  Negative Final   POC Amphetamine  UR 01/30/2022 None Detected  NONE DETECTED (Cut Off Level 1000 ng/mL) Final   POC Secobarbital (BAR) 01/30/2022 None Detected  NONE DETECTED (Cut Off Level 300 ng/mL) Final   POC Buprenorphine (BUP) 01/30/2022 None Detected  NONE DETECTED (Cut Off Level 10 ng/mL) Final   POC Oxazepam (BZO) 01/30/2022 None Detected  NONE DETECTED (Cut Off Level 300 ng/mL) Final   POC Cocaine UR 01/30/2022 None Detected  NONE DETECTED (Cut Off Level 300 ng/mL) Final   POC Methamphetamine UR 01/30/2022 None Detected  NONE DETECTED (Cut Off Level 1000 ng/mL) Final   POC Morphine 01/30/2022 None Detected  NONE DETECTED (Cut Off Level 300 ng/mL) Final   POC Methadone UR 01/30/2022 None Detected  NONE DETECTED (Cut Off Level 300 ng/mL) Final   POC Oxycodone UR 01/30/2022 None Detected  NONE DETECTED (Cut Off Level 100 ng/mL) Final   POC Marijuana UR 01/30/2022 None Detected  NONE DETECTED (Cut Off Level 50 ng/mL) Final   SARSCOV2ONAVIRUS 2 AG 01/30/2022 NEGATIVE  NEGATIVE Final   Comment: (NOTE) SARS-CoV-2 antigen NOT DETECTED.   Negative results are presumptive.  Negative results do not preclude SARS-CoV-2 infection and should not be used as the sole basis for treatment or other patient  management decisions, including infection  control decisions, particularly in the presence of clinical signs and  symptoms consistent with COVID-19, or in those who have been in contact with the virus.  Negative results must be combined with clinical observations, patient history, and epidemiological information. The expected result is Negative.  Fact Sheet for Patients: HandmadeRecipes.com.cy  Fact Sheet for Healthcare Providers: FuneralLife.at  This test is not yet approved or cleared by the [place] FDA and  has been authorized for detection and/or diagnosis of SARS-CoV-2 by FDA under an Emergency Use Authorization (EUA).  This EUA will remain in effect (meaning this test  can be used) for the duration of  the COV                          ID-19 declaration under Section 564(b)(1) of the Act, 21 U.S.C. section 360bbb-3(b)(1), unless the authorization is terminated or revoked sooner.    Admission on 01/21/2022, Discharged on 01/22/2022  Component Date Value Ref Range Status   Sodium 01/21/2022 140  135 - 145 mmol/L Final   Potassium 01/21/2022 3.9  3.5 - 5.1 mmol/L Final   Chloride 01/21/2022 110  98 - 111 mmol/L Final   CO2 01/21/2022 19 (L)  22 - 32 mmol/L Final   Glucose, Bld 01/21/2022 98  70 - 99 mg/dL Final   Glucose reference range applies only to samples taken after fasting for at least 8 hours.   BUN 01/21/2022 7  6 - 20 mg/dL Final   Creatinine, Ser 01/21/2022 0.60  0.44 - 1.00 mg/dL Final   Calcium 01/21/2022 9.2  8.9 - 10.3 mg/dL Final   GFR, Estimated 01/21/2022 >60  >60 mL/min Final   Comment: (NOTE) Calculated using the CKD-EPI Creatinine Equation (2021)    Anion gap 01/21/2022 11  5 - 15 Final   Performed at [facility], [place] [address] [place], [place] [address]   WBC 01/21/2022 6.7  4.0 - 10.5 K/uL Final   RBC 01/21/2022 4.94  3.87 - 5.11 MIL/uL Final   Hemoglobin 01/21/2022 12.7  12.0 - 15.0 g/dL Final   HCT 01/21/2022 40.9  36.0 - 46.0 % Final   MCV 01/21/2022 82.8  80.0 - 100.0 fL Final   MCH 01/21/2022 25.7 (L)  26.0 - 34.0 pg Final   MCHC 01/21/2022 31.1  30.0 - 36.0 g/dL Final   RDW 01/21/2022 13.7  11.5 - 15.5 % Final   Platelets 01/21/2022 328  150 - 400 K/uL Final   nRBC 01/21/2022 0.0  0.0 - 0.2 % Final   Performed at [facility] [address], [place], [place] [address]   Troponin I (High Sensitivity) 01/21/2022 <2  <18 ng/L Final   Comment: (NOTE) Elevated high sensitivity troponin I (hsTnI) values and significant  changes across serial measurements may suggest ACS but many other  chronic and acute conditions are known to elevate hsTnI results.  Refer to the "Links" section for chest pain algorithms  and additional  guidance. Performed at [facility], [place] [address], [place], [place] [address]    I-stat hCG, quantitative 01/21/2022 <5.0  <5 mIU/mL Final   Comment 3 01/21/2022          Final   Comment:   GEST. AGE      CONC.  (mIU/mL)   <=1 WEEK        5 - 50     2 WEEKS  50 - 500     3 WEEKS       100 - 10,000     4 WEEKS     1,000 - 30,000        FEMALE AND NON-PREGNANT FEMALE:     LESS THAN 5 mIU/mL    Troponin I (High Sensitivity) 01/22/2022 <2  <18 ng/L Final   Comment: (NOTE) Elevated high sensitivity troponin I (hsTnI) values and significant  changes across serial measurements may suggest ACS but many other  chronic and acute conditions are known to elevate hsTnI results.  Refer to the "Links" section for chest pain algorithms and additional  guidance. Performed at [facility], [place] [address], [place], [place] [address]   Admission on 01/18/2022, Discharged on 01/18/2022  Component Date Value Ref Range Status   WBC 01/18/2022 7.1  4.0 - 10.5 K/uL Final   RBC 01/18/2022 4.59  3.87 - 5.11 MIL/uL Final   Hemoglobin 01/18/2022 11.6 (L)  12.0 - 15.0 g/dL Final   HCT 01/18/2022 37.2  36.0 - 46.0 % Final   MCV 01/18/2022 81.0  80.0 - 100.0 fL Final   MCH 01/18/2022 25.3 (L)  26.0 - 34.0 pg Final   MCHC 01/18/2022 31.2  30.0 - 36.0 g/dL Final   RDW 01/18/2022 14.2  11.5 - 15.5 % Final   Platelets 01/18/2022 293  150 - 400 K/uL Final   nRBC 01/18/2022 0.0  0.0 - 0.2 % Final   Neutrophils Relative % 01/18/2022 82  % Final   Neutro Abs 01/18/2022 5.8  1.7 - 7.7 K/uL Final   Lymphocytes Relative 01/18/2022 10  % Final   Lymphs Abs 01/18/2022 0.7  0.7 - 4.0 K/uL Final   Monocytes Relative 01/18/2022 8  % Final   Monocytes Absolute 01/18/2022 0.5  0.1 - 1.0 K/uL Final   Eosinophils Relative 01/18/2022 0  % Final   Eosinophils Absolute 01/18/2022 0.0  0.0 - 0.5 K/uL Final   Basophils Relative 01/18/2022 0  % Final   Basophils Absolute 01/18/2022 0.0  0.0 -  0.1 K/uL Final   Immature Granulocytes 01/18/2022 0  % Final   Abs Immature Granulocytes 01/18/2022 0.03  0.00 - 0.07 K/uL Final   Performed at [facility], [place] [address], [place], [place] [address]   Sodium 01/18/2022 138  135 - 145 mmol/L Final   Potassium 01/18/2022 3.6  3.5 - 5.1 mmol/L Final   Chloride 01/18/2022 107  98 - 111 mmol/L Final   CO2 01/18/2022 21 (L)  22 - 32 mmol/L Final   Glucose, Bld 01/18/2022 103 (H)  70 - 99 mg/dL Final   Glucose reference range applies only to samples taken after fasting for at least 8 hours.   BUN 01/18/2022 10  6 - 20 mg/dL Final   Creatinine, Ser 01/18/2022 0.79  0.44 - 1.00 mg/dL Final   Calcium 01/18/2022 9.0  8.9 - 10.3 mg/dL Final   Total Protein 01/18/2022 7.1  6.5 - 8.1 g/dL Final   Albumin 01/18/2022 3.7  3.5 - 5.0 g/dL Final   AST 01/18/2022 27  15 - 41 U/L Final   ALT 01/18/2022 19  0 - 44 U/L Final   Alkaline Phosphatase 01/18/2022 74  38 - 126 U/L Final   Total Bilirubin 01/18/2022 0.3  0.3 - 1.2 mg/dL Final   GFR, Estimated 01/18/2022 >60  >60 mL/min Final   Comment: (NOTE) Calculated using the CKD-EPI Creatinine Equation (2021)    Anion gap  01/18/2022 10  5 - 15 Final   Performed at [facility], [place] [address] [place], [place] [address]   SARS Coronavirus 2 by RT PCR 01/18/2022 POSITIVE (A)  NEGATIVE Final   Comment: (NOTE) SARS-CoV-2 target nucleic acids are DETECTED.  The SARS-CoV-2 RNA is generally detectable in upper respiratory specimens during the acute phase of infection. Positive results are indicative of the presence of the identified virus, but do not rule out bacterial infection or co-infection with other pathogens not detected by the test. Clinical correlation with patient history and other diagnostic information is necessary to determine patient infection status. The expected result is Negative.  Fact Sheet for Patients: EntrepreneurPulse.com.au  Fact Sheet for Healthcare  Providers: IncredibleEmployment.be  This test is not yet approved or cleared by the [place] FDA and  has been authorized for detection and/or diagnosis of SARS-CoV-2 by FDA under an Emergency Use Authorization (EUA).  This EUA will remain in effect (meaning this test can be used) for the duration of  the COVID-19 declaration under Section 564(b)(1) of the A                          ct, 21 U.S.C. section 360bbb-3(b)(1), unless the authorization is terminated or revoked sooner.     Influenza A by PCR 01/18/2022 NEGATIVE  NEGATIVE Final   Influenza B by PCR 01/18/2022 NEGATIVE  NEGATIVE Final   Comment: (NOTE) The Xpert Xpress SARS-CoV-2/FLU/RSV plus assay is intended as an aid in the diagnosis of influenza from Nasopharyngeal swab specimens and should not be used as a sole basis for treatment. Nasal washings and aspirates are unacceptable for Xpert Xpress SARS-CoV-2/FLU/RSV testing.  Fact Sheet for Patients: EntrepreneurPulse.com.au  Fact Sheet for Healthcare Providers: IncredibleEmployment.be  This test is not yet approved or cleared by the [place] FDA and has been authorized for detection and/or diagnosis of SARS-CoV-2 by FDA under an Emergency Use Authorization (EUA). This EUA will remain in effect (meaning this test can be used) for the duration of the COVID-19 declaration under Section 564(b)(1) of the Act, 21 U.S.C. section 360bbb-3(b)(1), unless the authorization is terminated or revoked.     Resp Syncytial Virus by PCR 01/18/2022 NEGATIVE  NEGATIVE Final   Comment: (NOTE) Fact Sheet for Patients: EntrepreneurPulse.com.au  Fact Sheet for Healthcare Providers: IncredibleEmployment.be  This test is not yet approved or cleared by the [place] FDA and has been authorized for detection and/or diagnosis of SARS-CoV-2 by FDA under an Emergency Use Authorization (EUA).  This EUA will remain in effect (meaning this test can be used) for the duration of the COVID-19 declaration under Section 564(b)(1) of the Act, 21 U.S.C. section 360bbb-3(b)(1), unless the authorization is terminated or revoked.  Performed at [facility], [place] [address], [place], [place] [address]    Lipase 01/18/2022 29  11 - 51 U/L Final   Performed at [facility] [address], [place], [place] [address]  There may be more visits with results that are not included.    Allergies: Bee venom, Coconut flavor, Fish allergy, Geodon [ziprasidone hcl], Haloperidol and related, Lithobid [lithium], Roxicodone [oxycodone], Seroquel [quetiapine], Shellfish allergy, Phenergan [promethazine hcl], Prilosec [omeprazole], Sulfa antibiotics, Tegretol [carbamazepine], Prozac [fluoxetine], Tape, and Tylenol [acetaminophen]  Medications:  Facility Ordered Medications  Medication   alum & mag hydroxide-simeth (MAALOX/MYLANTA) 200-200-20 MG/5ML suspension 30 mL   magnesium hydroxide (MILK OF MAGNESIA) suspension 30 mL   hydrOXYzine (ATARAX)  tablet 25 mg   PTA Medications  Medication Sig   albuterol (VENTOLIN HFA) 108 (90 Base) MCG/ACT inhaler Inhale 2 puffs into the lungs every 6 (six) hours as needed for wheezing or shortness of breath.   budesonide-formoterol (SYMBICORT) 160-4.5 MCG/ACT inhaler Inhale 2 puffs into the lungs 2 (two) times daily.   pregabalin (LYRICA) 100 MG capsule Take 100 mg by mouth 3 (three) times daily.   diclofenac Sodium (VOLTAREN) 1 % GEL Apply 2 g topically 4 (four) times daily as needed (For pain).   hydrOXYzine (ATARAX) 25 MG tablet Take 25 mg by mouth 3 (three) times daily as needed for anxiety.   ondansetron (ZOFRAN) 4 MG tablet Take 1 tablet (4 mg total) by mouth every 6 (six) hours.    Medical Decision Making  Summit Borchardt is a 32 y/o single female with a history of schizoaffective disorder bipolar type, generalized anxiety disorder, autism spectrum  disorder, PTSD, borderline personality disorder and suicidal ideations presenting voluntarily to [facility] for suicidal ideations brought in tonight by [employer].     Recommendations  Based on my evaluation the patient does not appear to have an emergency medical condition. Patient will be admitted to [facility] for crisis  management, safety and stabilization.   Lucia Bitter, NP 03/11/22  2:43 AM
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
## 2022-03-11 DIAGNOSIS — F250 Schizoaffective disorder, bipolar type: Secondary | ICD-10-CM | POA: Diagnosis not present

## 2022-03-11 LAB — CBC WITH DIFFERENTIAL/PLATELET
Abs Immature Granulocytes: 0.02 10*3/uL (ref 0.00–0.07)
Basophils Absolute: 0 10*3/uL (ref 0.0–0.1)
Basophils Relative: 0 %
Eosinophils Absolute: 0 10*3/uL (ref 0.0–0.5)
Eosinophils Relative: 1 %
HCT: 39.6 % (ref 36.0–46.0)
Hemoglobin: 12.6 g/dL (ref 12.0–15.0)
Immature Granulocytes: 0 %
Lymphocytes Relative: 42 %
Lymphs Abs: 2.8 10*3/uL (ref 0.7–4.0)
MCH: 25 pg — ABNORMAL LOW (ref 26.0–34.0)
MCHC: 31.8 g/dL (ref 30.0–36.0)
MCV: 78.6 fL — ABNORMAL LOW (ref 80.0–100.0)
Monocytes Absolute: 0.4 10*3/uL (ref 0.1–1.0)
Monocytes Relative: 5 %
Neutro Abs: 3.5 10*3/uL (ref 1.7–7.7)
Neutrophils Relative %: 52 %
Platelets: 338 10*3/uL (ref 150–400)
RBC: 5.04 MIL/uL (ref 3.87–5.11)
RDW: 13.9 % (ref 11.5–15.5)
WBC: 6.8 10*3/uL (ref 4.0–10.5)
nRBC: 0 % (ref 0.0–0.2)

## 2022-03-11 LAB — COMPREHENSIVE METABOLIC PANEL
ALT: 17 U/L (ref 0–44)
AST: 22 U/L (ref 15–41)
Albumin: 4.3 g/dL (ref 3.5–5.0)
Alkaline Phosphatase: 60 U/L (ref 38–126)
Anion gap: 7 (ref 5–15)
BUN: 7 mg/dL (ref 6–20)
CO2: 25 mmol/L (ref 22–32)
Calcium: 9.2 mg/dL (ref 8.9–10.3)
Chloride: 105 mmol/L (ref 98–111)
Creatinine, Ser: 0.72 mg/dL (ref 0.44–1.00)
GFR, Estimated: >60 mL/min (ref >60)
Glucose, Bld: 80 mg/dL (ref 70–99)
Potassium: 3.7 mmol/L (ref 3.5–5.1)
Sodium: 137 mmol/L (ref 135–145)
Total Bilirubin: 0.3 mg/dL (ref 0.3–1.2)
Total Protein: 7.4 g/dL (ref 6.5–8.1)

## 2022-03-11 LAB — RESP PANEL BY RT-PCR (RSV, FLU A&B, COVID)  RVPGX2
Influenza A by PCR: NEGATIVE
Influenza B by PCR: NEGATIVE
Resp Syncytial Virus by PCR: NEGATIVE
SARS Coronavirus 2 by RT PCR: NEGATIVE

## 2022-03-11 LAB — ETHANOL: Alcohol, Ethyl (B): <10 mg/dL (ref <10)

## 2022-03-11 MED ORDER — CARIPRAZINE HCL 1.5 MG PO CAPS
1.5000 mg | ORAL_CAPSULE | Freq: Every day | ORAL | Status: DC
  Administered 2022-03-11: 1.5 mg via ORAL
  Filled 2022-03-11: qty 1

## 2022-03-11 MED ORDER — PANTOPRAZOLE SODIUM 40 MG PO TBEC
40.0000 mg | DELAYED_RELEASE_TABLET | Freq: Every day | ORAL | Status: DC
  Administered 2022-03-11: 40 mg via ORAL
  Filled 2022-03-11: qty 1

## 2022-03-11 MED ORDER — PREGABALIN 50 MG PO CAPS
100.0000 mg | ORAL_CAPSULE | Freq: Three times a day (TID) | ORAL | Status: DC
  Administered 2022-03-11: 100 mg via ORAL
  Filled 2022-03-11: qty 2

## 2022-03-11 MED ORDER — HYDROXYZINE HCL 25 MG PO TABS: 25.0000 mg | ORAL_TABLET | Freq: Three times a day (TID) | ORAL | Status: DC | PRN

## 2022-03-11 MED ORDER — METOPROLOL SUCCINATE ER 25 MG PO TB24
12.5000 mg | ORAL_TABLET | Freq: Every day | ORAL | Status: DC
  Administered 2022-03-11: 12.5 mg via ORAL
  Filled 2022-03-11: qty 1

## 2022-03-11 MED ORDER — ESCITALOPRAM OXALATE 10 MG PO TABS
10.0000 mg | ORAL_TABLET | Freq: Every day | ORAL | Status: DC
  Administered 2022-03-11: 10 mg via ORAL
  Filled 2022-03-11: qty 1

## 2022-03-11 NOTE — ED Notes (Signed)
Patient has been escorted off premises by security to the bus stop.

## 2022-03-11 NOTE — ED Notes (Signed)
Patient has been given breakfast

## 2022-03-11 NOTE — ED Notes (Signed)
Patient has been given her medication and is requesting Vraylar. I advised that we have made the provider aware.

## 2022-03-11 NOTE — ED Provider Notes (Cosign Needed Addendum)
FBC/OBS ASAP Discharge Summary  Date and Time: 03/11/2022 10:30 AM  Name: [name]  MRN:  [id number]   Discharge Diagnoses:  Final diagnoses:  Schizoaffective disorder, bipolar type [facility])    Subjective: [name] is a 32 year old Caucasian female that is well-known to this service.  She initially presented due to suicidal ideations.  it was reported that patient has requested to discharge due to her medications in being correct patient was seeking for medication adjustment.   While waiting for this provider it was reported patient started throwing milk and medications allover the floor. RN staff stated that patient started punching and kicking the bed and walls and was demanding to leave.  During evaluation [name] is sitting with security at bedside. Patient in no acute distress. She is alert/oriented x 3; calm/cooperative; and mood congruent with affect. She had good eye contact.There is no indication that she is currently responding to internal/external stimuli or experiencing delusional thought content; Patient has remained calm throughout assessment and has answered questions appropriately. Case was staffed with MD Dwyane Dee. Support, encouragement and reassurance was provided.   NP spoke to patient ACTT service at [phone] [name]) who reports plans to follow-up with patient on Monday, February 5.  Reports she recently had a medication adjustment by her psychiatrist Dr.Alemu Megstu.  States she will follow-up with a psychiatrist to see what additional medication adjustments can be made at this time  Stay Summary: At this time [name] is educated and verbalizes understanding of mental health resources and other crisis services in the community. She is instructed to call 911 and present to the nearest emergency room should she experience any suicidal/homicidal ideation, auditory/visual/hallucinations, or detrimental worsening of her mental  health condition.  She was a also advised by Probation officer that she could call the toll-free phone on insurance card to assist with identifying in network counselors and agencies or number on back of Medicaid card to speak with care coordinator.   Total Time spent with patient: 15 minutes  Tobacco Cessation:  N/A, patient does not currently use tobacco products  Current Medications:      PTA Medications:  PTA Medications  Medication Sig   albuterol (VENTOLIN HFA) 108 (90 Base) MCG/ACT inhaler Inhale 2 puffs into the lungs every 6 (six) hours as needed for wheezing or shortness of breath.   budesonide-formoterol (SYMBICORT) 160-4.5 MCG/ACT inhaler Inhale 2 puffs into the lungs 2 (two) times daily.   pregabalin (LYRICA) 100 MG capsule Take 100 mg by mouth 3 (three) times daily.   diclofenac Sodium (VOLTAREN) 1 % GEL Apply 2 g topically 4 (four) times daily as needed (For pain).   hydrOXYzine (ATARAX) 25 MG tablet Take 25 mg by mouth 3 (three) times daily as needed for anxiety.   ondansetron (ZOFRAN) 4 MG tablet Take 1 tablet (4 mg total) by mouth every 6 (six) hours.   Facility Ordered Medications  Medication   alum & mag hydroxide-simeth (MAALOX/MYLANTA) 200-200-20 MG/5ML suspension 30 mL   magnesium hydroxide (MILK OF MAGNESIA) suspension 30 mL   escitalopram (LEXAPRO) tablet 10 mg   hydrOXYzine (ATARAX) tablet 25 mg   metoprolol succinate (TOPROL-XL) 24 hr tablet 12.5 mg   pantoprazole (PROTONIX) EC tablet 40 mg   pregabalin (LYRICA) capsule 100 mg   cariprazine (VRAYLAR) capsule 1.5 mg       03/10/2022   10:01 PM 03/10/2022    9:56 PM 02/10/2022    1:49 AM  Depression screen PHQ 2/9  Decreased Interest '1 1 2  '$ Down, Depressed, Hopeless '1 1 2  '$ PHQ - 2 Score '2 2 4  '$ Altered sleeping '1 1 2  '$ Tired, decreased energy '1 1 2  '$ Change in appetite 1 0 2  Feeling bad or failure about yourself  '1 1 2  '$ Trouble concentrating '1 1 2  '$ Moving slowly or fidgety/restless '1 1 1  '$ Suicidal thoughts '1 1 1   '$ PHQ-9 Score '9 8 16  '$ Difficult doing work/chores Somewhat difficult  Very difficult    Flowsheet Row ED from 03/10/2022 in [facility] ED from 03/03/2022 in [facility] Emergency Department at [facility] ED from 03/02/2022 in [facility] Emergency Department at [place] High Risk Error: Q3, 4, or 5 should not be populated when Q2 is No Error: Q3, 4, or 5 should not be populated when Q2 is No       Musculoskeletal  Strength & Muscle Tone: within normal limits Gait & Station: normal Patient leans: N/A  Psychiatric Specialty Exam  Presentation  General Appearance:  Casual  Eye Contact: Fair  Speech: Clear and Coherent  Speech Volume: Normal  Handedness: Right   Mood and Affect  Mood: Angry  Affect: Labile   Thought Process  Thought Processes: Coherent; Goal Directed  Descriptions of Associations:Intact  Orientation:Full (Time, Place and Person)  Thought Content:WDL  Diagnosis of Schizophrenia or Schizoaffective disorder in past: Yes    Hallucinations:Hallucinations: None Description of Auditory Hallucinations: None  Ideas of Reference:None  Suicidal Thoughts:Suicidal Thoughts: Yes, Active SI Active Intent and/or Plan: With Intent; With Plan SI Passive Intent and/or Plan: Without Intent; Without Plan  Homicidal Thoughts:Homicidal Thoughts: No   Sensorium  Memory: Immediate Fair  Judgment: Poor  Insight: Poor   Executive Functions  Concentration: Fair  Attention Span: Fair  Recall: [place] of Knowledge: Good  Language: Good   Psychomotor Activity  Psychomotor Activity: Psychomotor Activity: Normal   Assets  Assets: Communication Skills; Housing; Physical Health; Resilience   Sleep  Sleep: Sleep: Fair Number of Hours of Sleep: -1   Nutritional Assessment (For OBS and FBC admissions only) Has the patient had a weight loss or gain of 10 pounds or  more in the last 3 months?: No Has the patient had a decrease in food intake/or appetite?: No Does the patient have dental problems?: No Does the patient have eating habits or behaviors that may be indicators of an eating disorder including binging or inducing vomiting?: No Has the patient recently lost weight without trying?: 0 Has the patient been eating poorly because of a decreased appetite?: 0 Malnutrition Screening Tool Score: 0    Physical Exam  Physical Exam Vitals and nursing note reviewed.  Cardiovascular:     Rate and Rhythm: Normal rate and regular rhythm.  Pulmonary:     Effort: Pulmonary effort is normal.  Neurological:     Mental Status: She is oriented to person, place, and time.  Psychiatric:        Mood and Affect: Mood normal.        Thought Content: Thought content normal.    Review of Systems  Respiratory: Negative.    Cardiovascular: Negative.   Psychiatric/Behavioral:  Positive for depression. Negative for hallucinations, substance abuse and suicidal ideas. The patient is not nervous/anxious.   All other systems reviewed and are negative.  Blood pressure 118/69, pulse 96, temperature 97.9 F (36.6 C), temperature source Oral, resp. rate 18, last  menstrual period 02/08/2022, SpO2 100 %. There is no height or weight on file to calculate BMI.  Demographic Factors:  Caucasian  Loss Factors: NA  Historical Factors: Impulsivity  Risk Reduction Factors:   Positive therapeutic relationship and NP spoke to Case management at [facility]  Continued Clinical Symptoms:  Severe Anxiety and/or Agitation Depression:   Impulsivity  Cognitive Features That Contribute To Risk:  Closed-mindedness    Suicide Risk:  Minimal: No identifiable suicidal ideation.  Patients presenting with no risk factors but with morbid ruminations; may be classified as minimal risk based on the severity of the depressive symptoms  Plan Of Care/Follow-up recommendations:   Activity:  as tolerated  This provider contacted patient's act team [name] case Freight forwarder at [phone].  She reports patient is currently followed by MD. Alemu Mengestu who recently made medication adjustments.  States she has a follow-up appointment on March 12, 2022  Disposition: Take all of you medications as prescribed by your mental healthcare provider.  Report any adverse effects and reactions from your medications to your outpatient provider promptly.  Do not engage in alcohol and or illegal drug use while on prescription medicines. Keep all scheduled appointments. This is to ensure that you are getting refills on time and to avoid any interruption in your medication.  If you are unable to keep an appointment call to reschedule.  Be sure to follow up with resources and follow-ups given.  In the event of worsening symptoms call the crisis hotline, 911, and or go to the nearest emergency department for appropriate evaluation and treatment of symptoms. Follow-up with your primary care provider for your medical issues, concerns and or health care needs.    Derrill Center, NP 03/11/2022, 10:30 AM

## 2022-03-11 NOTE — ED Notes (Signed)
Patient is currently sitting in bed quietly no distress noted. The patient came to this nurse and advised of all of her home meds and asked for them.  I advised at this time there are only per diem medications on file for her. Advised her I will speak with the provider.
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
## 2022-03-11 NOTE — Discharge Instructions (Signed)
Take all of you medications as prescribed by your mental healthcare provider. Report any adverse effects and reactions from your medications to your outpatient provider promptly.  Do not engage in alcohol and or illegal drug use while on prescription medicines.  Keep all scheduled appointments. This is to ensure that you are getting refills on time and to avoid any interruption in your medication.  If you are unable to keep an appointment call to reschedule.  Be sure to follow up with resources and follow ups given.  In the event of worsening symptoms call the crisis hotline, 911, and or go to the nearest emergency department for appropriate evaluation and treatment of symptoms.  Follow-up with your primary care provider for your medical issues, concerns and or health care needs.    

## 2022-03-11 NOTE — ED Notes (Signed)
Patient began to kick and punch things on the unit, we attempted to redirect patient, security had to be  called, NP Tanika discharged patient

## 2022-03-19 ENCOUNTER — Encounter: Payer: PPO | Admitting: Obstetrics and Gynecology

## 2022-03-22 ENCOUNTER — Emergency Department (HOSPITAL_COMMUNITY)
Admission: EM | Admit: 2022-03-22 | Discharge: 2022-03-23 | Disposition: A | Discharge status: Home / Self Care | Payer: Medicare PPO | Attending: Emergency Medicine | Admitting: Emergency Medicine

## 2022-03-22 ENCOUNTER — Encounter (HOSPITAL_COMMUNITY): Payer: Self-pay

## 2022-03-22 ENCOUNTER — Other Ambulatory Visit: Payer: Self-pay

## 2022-03-22 DIAGNOSIS — Z79899 Other long term (current) drug therapy: Secondary | ICD-10-CM | POA: Diagnosis not present

## 2022-03-22 DIAGNOSIS — F4325 Adjustment disorder with mixed disturbance of emotions and conduct: Secondary | ICD-10-CM | POA: Diagnosis present

## 2022-03-22 DIAGNOSIS — J45909 Unspecified asthma, uncomplicated: Secondary | ICD-10-CM | POA: Diagnosis not present

## 2022-03-22 DIAGNOSIS — R45851 Suicidal ideations: Secondary | ICD-10-CM | POA: Insufficient documentation

## 2022-03-22 DIAGNOSIS — Z1152 Encounter for screening for COVID-19: Secondary | ICD-10-CM | POA: Diagnosis not present

## 2022-03-22 DIAGNOSIS — F329 Major depressive disorder, single episode, unspecified: Secondary | ICD-10-CM | POA: Diagnosis not present

## 2022-03-22 DIAGNOSIS — F4321 Adjustment disorder with depressed mood: Secondary | ICD-10-CM

## 2022-03-22 DIAGNOSIS — Z7951 Long term (current) use of inhaled steroids: Secondary | ICD-10-CM | POA: Diagnosis not present

## 2022-03-22 LAB — COMPREHENSIVE METABOLIC PANEL
ALT: 17 U/L (ref 0–44)
AST: 23 U/L (ref 15–41)
Albumin: 3.7 g/dL (ref 3.5–5.0)
Alkaline Phosphatase: 50 U/L (ref 38–126)
Anion gap: 11 (ref 5–15)
BUN: 9 mg/dL (ref 6–20)
CO2: 20 mmol/L — ABNORMAL LOW (ref 22–32)
Calcium: 8.9 mg/dL (ref 8.9–10.3)
Chloride: 106 mmol/L (ref 98–111)
Creatinine, Ser: 0.86 mg/dL (ref 0.44–1.00)
GFR, Estimated: >60 mL/min (ref >60)
Glucose, Bld: 124 mg/dL — ABNORMAL HIGH (ref 70–99)
Potassium: 3.7 mmol/L (ref 3.5–5.1)
Sodium: 137 mmol/L (ref 135–145)
Total Bilirubin: 0.8 mg/dL (ref 0.3–1.2)
Total Protein: 6.6 g/dL (ref 6.5–8.1)

## 2022-03-22 LAB — CBC WITH DIFFERENTIAL/PLATELET
Abs Immature Granulocytes: 0.02 10*3/uL (ref 0.00–0.07)
Basophils Absolute: 0 10*3/uL (ref 0.0–0.1)
Basophils Relative: 0 %
Eosinophils Absolute: 0 10*3/uL (ref 0.0–0.5)
Eosinophils Relative: 0 %
HCT: 36 % (ref 36.0–46.0)
Hemoglobin: 11.2 g/dL — ABNORMAL LOW (ref 12.0–15.0)
Immature Granulocytes: 0 %
Lymphocytes Relative: 35 %
Lymphs Abs: 2.8 10*3/uL (ref 0.7–4.0)
MCH: 25.2 pg — ABNORMAL LOW (ref 26.0–34.0)
MCHC: 31.1 g/dL (ref 30.0–36.0)
MCV: 81.1 fL (ref 80.0–100.0)
Monocytes Absolute: 0.4 10*3/uL (ref 0.1–1.0)
Monocytes Relative: 4 %
Neutro Abs: 4.8 10*3/uL (ref 1.7–7.7)
Neutrophils Relative %: 61 %
Platelets: 234 10*3/uL (ref 150–400)
RBC: 4.44 MIL/uL (ref 3.87–5.11)
RDW: 14.6 % (ref 11.5–15.5)
WBC: 8 10*3/uL (ref 4.0–10.5)
nRBC: 0 % (ref 0.0–0.2)

## 2022-03-22 LAB — RAPID URINE DRUG SCREEN, HOSP PERFORMED
Amphetamines: NOT DETECTED
Barbiturates: NOT DETECTED
Benzodiazepines: NOT DETECTED
Cocaine: NOT DETECTED
Opiates: NOT DETECTED
Tetrahydrocannabinol: NOT DETECTED

## 2022-03-22 LAB — RESP PANEL BY RT-PCR (RSV, FLU A&B, COVID)  RVPGX2
Influenza A by PCR: NEGATIVE
Influenza B by PCR: NEGATIVE
Resp Syncytial Virus by PCR: NEGATIVE
SARS Coronavirus 2 by RT PCR: NEGATIVE

## 2022-03-22 LAB — ETHANOL: Alcohol, Ethyl (B): <10 mg/dL

## 2022-03-22 MED ORDER — HYDROXYZINE HCL 25 MG PO TABS
25.0000 mg | ORAL_TABLET | Freq: Once | ORAL | Status: AC
  Administered 2022-03-23: 25 mg via ORAL
  Filled 2022-03-22: qty 1

## 2022-03-22 MED ORDER — DICLOFENAC SODIUM 1 % EX GEL
2.0000 g | Freq: Four times a day (QID) | CUTANEOUS | Status: DC | PRN
  Administered 2022-03-22 – 2022-03-23 (×2): 2 g via TOPICAL
  Filled 2022-03-22: qty 100

## 2022-03-22 MED ORDER — LORAZEPAM 1 MG PO TABS
1.0000 mg | ORAL_TABLET | Freq: Once | ORAL | Status: AC
  Administered 2022-03-22: 1 mg via ORAL
  Filled 2022-03-22: qty 1

## 2022-03-22 NOTE — ED Provider Triage Note (Signed)
Emergency Medicine Provider Triage Evaluation Note  [name] , a 31 y.o. female  was evaluated in triage.  Pt complains of SI with plan. She reports that she doesn't think her Lexapro is working and wants to be placed on Zoloft. Denies any AVH. She is here voluntarily.  Review of Systems  Positive:  Negative:   Physical Exam  BP 139/78   Pulse (!) 105   Temp 97.9 F (36.6 C) (Oral)   Resp 17   LMP 03/09/2022   SpO2 96%  Gen:   Awake, anxious appearing Resp:  Normal effort MSK:   Moves extremities without difficulty  Other:    Medical Decision Making  Medically screening exam initiated at 7:38 PM.  Appropriate orders placed.  [name] was informed that the remainder of the evaluation will be completed by another provider, this initial triage assessment does not replace that evaluation, and the importance of remaining in the ED until their evaluation is complete.  Medical clearance labs ordered   Sherrell Puller, Hershal Coria 03/22/22 1941

## 2022-03-22 NOTE — ED Notes (Signed)
Gave pt a [place] sandwich and soda

## 2022-03-22 NOTE — ED Notes (Signed)
Changed pt out into purple scrubs, pt has eyeglass case that has the wipe cloth inside and her slides, took pt belongings 2 personal belonging bags one with a coat, other with pants, shirt and bracelet in denture cup, cellphone charger, wallet with cards, purse that is black/ brown. Big black bag with clothes inside all bags labeled and placed under the cabinets behind the nurses station

## 2022-03-22 NOTE — ED Triage Notes (Addendum)
Patient arrived stating she has been feeling suicidal for about a week, states she has not done anything to hurt herself. Has multiple plans like jumping out into traffic or cutting herself. States her husbands anniversary of death is triggering and does not feel like her medication is working.
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
## 2022-03-23 DIAGNOSIS — F4325 Adjustment disorder with mixed disturbance of emotions and conduct: Secondary | ICD-10-CM | POA: Diagnosis not present

## 2022-03-23 DIAGNOSIS — F329 Major depressive disorder, single episode, unspecified: Secondary | ICD-10-CM | POA: Diagnosis not present

## 2022-03-23 MED ORDER — PRAZOSIN HCL 1 MG PO CAPS: 1.0000 mg | ORAL_CAPSULE | Freq: Every day | ORAL | Status: DC

## 2022-03-23 MED ORDER — ESCITALOPRAM OXALATE 10 MG PO TABS
10.0000 mg | ORAL_TABLET | Freq: Every day | ORAL | Status: DC
  Administered 2022-03-23: 10 mg via ORAL
  Filled 2022-03-23: qty 1

## 2022-03-23 MED ORDER — MELATONIN 3 MG PO TABS: 3.0000 mg | ORAL_TABLET | Freq: Every day | ORAL | Status: DC

## 2022-03-23 MED ORDER — IBUPROFEN 200 MG PO TABS: 400.0000 mg | ORAL_TABLET | Freq: Three times a day (TID) | ORAL | Status: DC | PRN

## 2022-03-23 MED ORDER — PANTOPRAZOLE SODIUM 40 MG PO TBEC: 40.0000 mg | DELAYED_RELEASE_TABLET | Freq: Every day | ORAL | Status: DC

## 2022-03-23 MED ORDER — ALBUTEROL SULFATE HFA 108 (90 BASE) MCG/ACT IN AERS: 2.0000 | INHALATION_SPRAY | Freq: Four times a day (QID) | RESPIRATORY_TRACT | Status: DC | PRN

## 2022-03-23 MED ORDER — PREGABALIN 50 MG PO CAPS
100.0000 mg | ORAL_CAPSULE | Freq: Three times a day (TID) | ORAL | Status: DC
  Administered 2022-03-23 (×2): 100 mg via ORAL
  Filled 2022-03-23 (×2): qty 2

## 2022-03-23 MED ORDER — FLUTICASONE FUROATE-VILANTEROL 200-25 MCG/ACT IN AEPB
2.0000 | INHALATION_SPRAY | Freq: Every day | RESPIRATORY_TRACT | Status: DC
  Filled 2022-03-23 (×2): qty 28

## 2022-03-23 MED ORDER — PANTOPRAZOLE SODIUM 40 MG PO TBEC
40.0000 mg | DELAYED_RELEASE_TABLET | Freq: Every day | ORAL | Status: DC
  Administered 2022-03-23: 40 mg via ORAL
  Filled 2022-03-23 (×2): qty 1

## 2022-03-23 MED ORDER — CARIPRAZINE HCL 1.5 MG PO CAPS
3.0000 mg | ORAL_CAPSULE | Freq: Every day | ORAL | Status: DC
  Administered 2022-03-23: 3 mg via ORAL
  Filled 2022-03-23: qty 2

## 2022-03-23 MED ORDER — METOPROLOL SUCCINATE ER 25 MG PO TB24
12.5000 mg | ORAL_TABLET | Freq: Every day | ORAL | Status: DC
  Administered 2022-03-23: 12.5 mg via ORAL
  Filled 2022-03-23: qty 0.5

## 2022-03-23 MED ORDER — HYDROXYZINE HCL 25 MG PO TABS
25.0000 mg | ORAL_TABLET | Freq: Three times a day (TID) | ORAL | Status: DC | PRN
  Administered 2022-03-23: 25 mg via ORAL
  Filled 2022-03-23: qty 1

## 2022-03-23 MED ORDER — MELATONIN 3 MG PO TABS
3.0000 mg | ORAL_TABLET | Freq: Every day | ORAL | Status: DC
  Administered 2022-03-23: 3 mg via ORAL
  Filled 2022-03-23: qty 1

## 2022-03-23 MED ORDER — CYCLOBENZAPRINE HCL 10 MG PO TABS: 10.0000 mg | ORAL_TABLET | Freq: Every day | ORAL | Status: DC

## 2022-03-23 NOTE — Progress Notes (Signed)
Inpatient Behavioral Health Placement  Pt meets inpatient criteria per [name], NP.  There are no available beds at [facility]. Referral was sent to the following facilities;    Destination  Service Provider Address Phone Fax  [facility]  [address] [place] [address] [phone]  [facility]  [place], [place] [address] [phone] [address] [place] [address] [phone] [place]  [place], [place] [address] [phone]  [facility]  [address], [place] [address] [phone]  [place] [address], [place] [address] [phone]  [facility]  [address] [place] [address] [phone]  [facility]  [address] [place] [address] [phone] [place]  [address] [place] [address] [phone] [facility]  [address], [place] [address] [phone]  [facility]  [address] [place] [address] [place]  [facility]  [address] [place] [address] [phone]  [facility]  [address] [place] [address] [phone]   Situation ongoing,  CSW will follow up.   Benjaman Kindler, MSW, LCSWA 03/23/2022  @ 1:21 AM

## 2022-03-23 NOTE — ED Provider Notes (Signed)
Emergency Medicine Observation Re-evaluation Note  [name] is a 32 y.o. female, seen on rounds today.  Pt initially presented to the ED for complaints of Suicidal Currently, the patient is resting, asking about some of her meds.  Physical Exam  BP 95/78 (BP Location: Left Arm)   Pulse 80   Temp 99.1 F (37.3 C) (Oral)   Resp 17   LMP 03/09/2022   SpO2 99%  Physical Exam General: no acute distress Lungs: normal effort Psych: denies SI  ED Course / MDM  EKG:EKG Interpretation  Date/Time:  Thursday March 22 2022 19:35:49 EST Ventricular Rate:  100 PR Interval:  127 QRS Duration: 87 QT Interval:  355 QTC Calculation: 458 R Axis:   51 Text Interpretation: Sinus tachycardia Low voltage, precordial leads Borderline T abnormalities, inferior leads when comapred to prior,  similar S1Q3T3 to prior in january. No STEMI Confirmed by Antony Blackbird [phone]) on 03/22/2022 8:24:26 PM  I have reviewed the labs performed to date as well as medications administered while in observation.  Recent changes in the last 24 hours include home meds being ordered.  Plan  Current plan is for inpatient Psychiatric placement. Was IVC'd this morning by EDP.    Sherwood Gambler, MD 03/23/22 [phone]

## 2022-03-23 NOTE — ED Provider Notes (Signed)
IVC by me    Randal Buba, Jamileth Putzier, MD 03/23/22 [phone]

## 2022-03-23 NOTE — ED Notes (Signed)
Pt refused to get vital signs taken. Agricultural consultant notified.

## 2022-03-23 NOTE — ED Notes (Signed)
Patient discharged off unit per provider. Patient alert and cooperative, no s/s of distress at this time. Discharge information give to patient with acknowledged understanding. Patient ambulatory off unit, patient escorted by paramedic. Patient using bus pass for transportation.
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
## 2022-03-23 NOTE — ED Notes (Addendum)
Pt belongings moved to [address]. 2 white bags and 1 black bag.

## 2022-03-23 NOTE — ED Notes (Signed)
Gave patient her bible from personal belongings to read
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
## 2022-03-23 NOTE — ED Provider Notes (Signed)
  Physical Exam  BP 115/69   Pulse 87   Temp 97.9 F (36.6 C) (Oral)   Resp 18   LMP 03/09/2022   SpO2 100%   Physical Exam  Procedures  Procedures  ED Course / MDM    Medical Decision Making Patient seen by psych and they recommend reversal of IVC and discharge.   Problems Addressed: Adjustment disorder with depressed mood: acute illness or injury  Amount and/or Complexity of Data Reviewed Labs: ordered. Decision-making details documented in ED Course.  Risk Prescription drug management.          Drenda Freeze, MD 03/23/22 [phone]

## 2022-03-23 NOTE — Discharge Instructions (Addendum)
Please follow-up with your counselor or [facility]   Return to ER if you have thoughts of harming yourself or others

## 2022-03-23 NOTE — ED Notes (Signed)
Gave pt water and graham crackers

## 2022-03-27 ENCOUNTER — Telehealth: Payer: Self-pay

## 2022-03-27 NOTE — Telephone Encounter (Signed)
     Patient  visit on 2/16  at [facility] you been able to follow up with your primary care physician? Yes   The patient was or was not able to obtain any needed medicine or equipment. Yes   Are there diet recommendations that you are having difficulty following? Na   Patient expresses understanding of discharge instructions and education provided has no other needs at this time.  Yes      [facility] [phone] [address], [address], [place] [address] Phone: [phone] Email: [email or web address]

## 2022-04-06 ENCOUNTER — Ambulatory Visit: Payer: PPO | Admitting: Neurology

## 2022-04-11 ENCOUNTER — Ambulatory Visit (HOSPITAL_COMMUNITY)
Admission: EM | Admit: 2022-04-11 | Discharge: 2022-04-12 | Disposition: A | Discharge status: Home / Self Care | Payer: Medicare PPO | Attending: Psychiatry | Admitting: Psychiatry

## 2022-04-11 ENCOUNTER — Other Ambulatory Visit: Payer: Self-pay

## 2022-04-11 DIAGNOSIS — F4310 Post-traumatic stress disorder, unspecified: Secondary | ICD-10-CM | POA: Insufficient documentation

## 2022-04-11 DIAGNOSIS — G4700 Insomnia, unspecified: Secondary | ICD-10-CM | POA: Insufficient documentation

## 2022-04-11 DIAGNOSIS — Z79899 Other long term (current) drug therapy: Secondary | ICD-10-CM | POA: Insufficient documentation

## 2022-04-11 DIAGNOSIS — Z634 Disappearance and death of family member: Secondary | ICD-10-CM | POA: Insufficient documentation

## 2022-04-11 DIAGNOSIS — R45851 Suicidal ideations: Secondary | ICD-10-CM | POA: Insufficient documentation

## 2022-04-11 DIAGNOSIS — F250 Schizoaffective disorder, bipolar type: Secondary | ICD-10-CM | POA: Diagnosis not present

## 2022-04-11 DIAGNOSIS — F314 Bipolar disorder, current episode depressed, severe, without psychotic features: Secondary | ICD-10-CM | POA: Diagnosis not present

## 2022-04-11 DIAGNOSIS — Z1152 Encounter for screening for COVID-19: Secondary | ICD-10-CM | POA: Diagnosis not present

## 2022-04-11 DIAGNOSIS — Z765 Malingerer [conscious simulation]: Secondary | ICD-10-CM | POA: Diagnosis not present

## 2022-04-11 DIAGNOSIS — Z9151 Personal history of suicidal behavior: Secondary | ICD-10-CM | POA: Insufficient documentation

## 2022-04-11 DIAGNOSIS — Z91410 Personal history of adult physical and sexual abuse: Secondary | ICD-10-CM | POA: Diagnosis not present

## 2022-04-11 LAB — POCT URINE DRUG SCREEN - MANUAL ENTRY (I-SCREEN)
POC Amphetamine UR: NOT DETECTED
POC Buprenorphine (BUP): NOT DETECTED
POC Cocaine UR: NOT DETECTED
POC Marijuana UR: NOT DETECTED
POC Methadone UR: NOT DETECTED
POC Methamphetamine UR: NOT DETECTED
POC Morphine: NOT DETECTED
POC Oxazepam (BZO): NOT DETECTED
POC Oxycodone UR: NOT DETECTED
POC Secobarbital (BAR): NOT DETECTED

## 2022-04-11 LAB — POC SARS CORONAVIRUS 2 AG: SARSCOV2ONAVIRUS 2 AG: NEGATIVE

## 2022-04-11 LAB — POC URINE PREG, ED: Preg Test, Ur: NEGATIVE

## 2022-04-11 MED ORDER — LORAZEPAM 1 MG PO TABS: 1.0000 mg | ORAL_TABLET | ORAL | Status: DC | PRN

## 2022-04-11 MED ORDER — HYDROXYZINE HCL 25 MG PO TABS
25.0000 mg | ORAL_TABLET | Freq: Once | ORAL | Status: AC
  Administered 2022-04-11: 25 mg via ORAL
  Filled 2022-04-11: qty 1

## 2022-04-11 MED ORDER — MAGNESIUM HYDROXIDE 400 MG/5ML PO SUSP: 30.0000 mL | Freq: Every day | ORAL | Status: DC | PRN

## 2022-04-11 MED ORDER — MELATONIN 3 MG PO TABS
3.0000 mg | ORAL_TABLET | Freq: Once | ORAL | Status: AC
  Administered 2022-04-11: 3 mg via ORAL
  Filled 2022-04-11: qty 1

## 2022-04-11 MED ORDER — OLANZAPINE 10 MG PO TBDP: 10.0000 mg | ORAL_TABLET | Freq: Three times a day (TID) | ORAL | Status: DC | PRN

## 2022-04-11 MED ORDER — ALUM & MAG HYDROXIDE-SIMETH 200-200-20 MG/5ML PO SUSP: 30.0000 mL | ORAL | Status: DC | PRN
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
## 2022-04-11 NOTE — ED Provider Notes (Signed)
[facility] Continuous Assessment Admission H&P  Date: 04/12/22 Patient Name: [name] MRN: [id number] Chief Complaint: feeling suicidal  Diagnoses:  Final diagnoses:  Suicidal ideation  Malingering    HPI: [name],  32 y/o female with a very extensive psych history and ER visits which includes adjustment disorders, schizoaffective disorder, suicide ideation presented to [facility] via [facility] voluntarily.  Per the patient she is suicidal with plans to overdose, cut herself.  According to patient she is triggered by not having her daughter and have to go see her once a month and cannot bring her home, patient also stated that her husband anniversary and all this triggers her to want to commit suicide.  According to the patient she came in tonight because she do not want to do it so this the best way.  Face-to-face observation of patient, patient is alert and oriented x 4, speech is clear.  Patient endorsed suicidal ideation with plans to use her fingernails to cut herself or overdose.  Patient denies HI, AVH or paranoia at this time.  Patient is followed by an ACT service, prior medical history shows that patient had medication adjustment by her psychiatrist Dr Lamount Cohen a few weeks ago and she was supposed to follow-up. Patient does seem to have malingering behaviors.  Patient story almost mimic her last visit.  Does not seem to be influenced by external or internal stimuli. Will restart patient home medication  Will not draw any lab work due to patient was last seen 03/22/22  Recommend overnight observation Total Time spent with patient: 20 minutes  Musculoskeletal  Strength & Muscle Tone: within normal limits Gait & Station: normal Patient leans: N/A  Psychiatric Specialty Exam  Presentation General Appearance:  Casual  Eye Contact: Good  Speech: Clear and Coherent  Speech Volume: Normal  Handedness: Right   Mood and Affect   Mood: Depressed  Affect: Depressed   Thought Process  Thought Processes: Coherent  Descriptions of Associations:Intact  Orientation:Full (Time, Place and Person)  Thought Content:WDL  Diagnosis of Schizophrenia or Schizoaffective disorder in past: Yes  Duration of Psychotic Symptoms: Greater than six months  Hallucinations:Hallucinations: None  Ideas of Reference:None  Suicidal Thoughts:Suicidal Thoughts: Yes, Passive SI Active Intent and/or Plan: With Intent; With Plan  Homicidal Thoughts:Homicidal Thoughts: No   Sensorium  Memory: Immediate Fair  Judgment: Poor  Insight: Poor   Executive Functions  Concentration: Fair  Attention Span: Fair  Recall: [employer] of Knowledge: Fair  Language: Fair   Psychomotor Activity  Psychomotor Activity: Psychomotor Activity: Normal   Assets  Assets: Desire for Improvement; Social Support; Resilience   Sleep  Sleep: Sleep: Poor   Nutritional Assessment (For OBS and FBC admissions only) Has the patient had a weight loss or gain of 10 pounds or more in the last 3 months?: No Has the patient had a decrease in food intake/or appetite?: No Does the patient have dental problems?: No Does the patient have eating habits or behaviors that may be indicators of an eating disorder including binging or inducing vomiting?: No Has the patient recently lost weight without trying?: 0 Has the patient been eating poorly because of a decreased appetite?: 0 Malnutrition Screening Tool Score: 0    Physical Exam HENT:     Head: Normocephalic.     Nose: Nose normal.  Eyes:     Pupils: Pupils are equal, round, and reactive to light.  Cardiovascular:     Rate and Rhythm: Normal rate.  Pulmonary:  Effort: Pulmonary effort is normal.  Musculoskeletal:     Cervical back: Normal range of motion.  Neurological:     General: No focal deficit present.     Mental Status: She is alert.  Psychiatric:        Mood  and Affect: Mood normal.        Behavior: Behavior normal.        Thought Content: Thought content normal.        Judgment: Judgment normal.    Review of Systems  Constitutional: Negative.   HENT: Negative.    Eyes: Negative.   Respiratory: Negative.    Cardiovascular: Negative.   Genitourinary: Negative.   Musculoskeletal: Negative.   Skin: Negative.   Neurological: Negative.   Psychiatric/Behavioral:  Positive for depression and suicidal ideas. The patient is nervous/anxious.     Blood pressure 109/61, pulse (!) 104, temperature 98.5 F (36.9 C), temperature source Oral, resp. rate 18, last menstrual period 03/09/2022, SpO2 99 %. There is no height or weight on file to calculate BMI.  Past Psychiatric History: Adjustment disorder, suicidal ideation, schizoaffective disorder  Is the patient at risk to self? Yes  Has the patient been a risk to self in the past 6 months? Yes .    Has the patient been a risk to self within the distant past? Yes   Is the patient a risk to others? No   Has the patient been a risk to others in the past 6 months? No   Has the patient been a risk to others within the distant past? No   Past Medical History: See chart  Family History: Unknown  Social History: Unknown  Last Labs:  Admission on 04/11/2022  Component Date Value Ref Range Status   Preg Test, Ur 04/11/2022 Negative  Negative Final   POC Amphetamine UR 04/11/2022 None Detected  NONE DETECTED (Cut Off Level 1000 ng/mL) Final   POC Secobarbital (BAR) 04/11/2022 None Detected  NONE DETECTED (Cut Off Level 300 ng/mL) Final   POC Buprenorphine (BUP) 04/11/2022 None Detected  NONE DETECTED (Cut Off Level 10 ng/mL) Final   POC Oxazepam (BZO) 04/11/2022 None Detected  NONE DETECTED (Cut Off Level 300 ng/mL) Final   POC Cocaine UR 04/11/2022 None Detected  NONE DETECTED (Cut Off Level 300 ng/mL) Final   POC Methamphetamine UR 04/11/2022 None Detected  NONE DETECTED (Cut Off Level 1000 ng/mL)  Final   POC Morphine 04/11/2022 None Detected  NONE DETECTED (Cut Off Level 300 ng/mL) Final   POC Methadone UR 04/11/2022 None Detected  NONE DETECTED (Cut Off Level 300 ng/mL) Final   POC Oxycodone UR 04/11/2022 None Detected  NONE DETECTED (Cut Off Level 100 ng/mL) Final   POC Marijuana UR 04/11/2022 None Detected  NONE DETECTED (Cut Off Level 50 ng/mL) Final   SARSCOV2ONAVIRUS 2 AG 04/11/2022 NEGATIVE  NEGATIVE Final   Comment: (NOTE) SARS-CoV-2 antigen NOT DETECTED.   Negative results are presumptive.  Negative results do not preclude SARS-CoV-2 infection and should not be used as the sole basis for treatment or other patient management decisions, including infection  control decisions, particularly in the presence of clinical signs and  symptoms consistent with COVID-19, or in those who have been in contact with the virus.  Negative results must be combined with clinical observations, patient history, and epidemiological information. The expected result is Negative.  Fact Sheet for Patients: HandmadeRecipes.com.cy  Fact Sheet for Healthcare Providers: FuneralLife.at  This test is not yet approved or cleared by  the [employer] and  has been authorized for detection and/or diagnosis of SARS-CoV-2 by FDA under an Emergency Use Authorization (EUA).  This EUA will remain in effect (meaning this test can be used) for the duration of  the COV                          ID-19 declaration under Section 564(b)(1) of the Act, 21 U.S.C. section 360bbb-3(b)(1), unless the authorization is terminated or revoked sooner.    Admission on 03/22/2022, Discharged on 03/23/2022  Component Date Value Ref Range Status   Sodium 03/22/2022 137  135 - 145 mmol/L Final   Potassium 03/22/2022 3.7  3.5 - 5.1 mmol/L Final   Chloride 03/22/2022 106  98 - 111 mmol/L Final   CO2 03/22/2022 20 (L)  22 - 32 mmol/L Final   Glucose, Bld 03/22/2022 124 (H)  70  - 99 mg/dL Final   Glucose reference range applies only to samples taken after fasting for at least 8 hours.   BUN 03/22/2022 9  6 - 20 mg/dL Final   Creatinine, Ser 03/22/2022 0.86  0.44 - 1.00 mg/dL Final   Calcium 03/22/2022 8.9  8.9 - 10.3 mg/dL Final   Total Protein 03/22/2022 6.6  6.5 - 8.1 g/dL Final   Albumin 03/22/2022 3.7  3.5 - 5.0 g/dL Final   AST 03/22/2022 23  15 - 41 U/L Final   ALT 03/22/2022 17  0 - 44 U/L Final   Alkaline Phosphatase 03/22/2022 50  38 - 126 U/L Final   Total Bilirubin 03/22/2022 0.8  0.3 - 1.2 mg/dL Final   GFR, Estimated 03/22/2022 >60  >60 mL/min Final   Comment: (NOTE) Calculated using the CKD-EPI Creatinine Equation (2021)    Anion gap 03/22/2022 11  5 - 15 Final   Performed at [facility], [place] [address], [place], [place] [address]   Alcohol, Ethyl (B) 03/22/2022 <10  <10 mg/dL Final   Comment: (NOTE) Lowest detectable limit for serum alcohol is 10 mg/dL.  For medical purposes only. Performed at [facility], [place] [address], [place], [place] [address]    Opiates 03/22/2022 NONE DETECTED  NONE DETECTED Final   Cocaine 03/22/2022 NONE DETECTED  NONE DETECTED Final   Benzodiazepines 03/22/2022 NONE DETECTED  NONE DETECTED Final   Amphetamines 03/22/2022 NONE DETECTED  NONE DETECTED Final   Tetrahydrocannabinol 03/22/2022 NONE DETECTED  NONE DETECTED Final   Barbiturates 03/22/2022 NONE DETECTED  NONE DETECTED Final   Comment: (NOTE) DRUG SCREEN FOR MEDICAL PURPOSES ONLY.  IF CONFIRMATION IS NEEDED FOR ANY PURPOSE, NOTIFY LAB WITHIN 5 DAYS.  LOWEST DETECTABLE LIMITS FOR URINE DRUG SCREEN Drug Class                     Cutoff (ng/mL) Amphetamine and metabolites    1000 Barbiturate and metabolites    200 Benzodiazepine                 200 Opiates and metabolites        300 Cocaine and metabolites        300 THC                            50 Performed at [facility], [place]  [address], [place], [place] [address]    WBC 03/22/2022 8.0  4.0 - 10.5 K/uL Final  RBC 03/22/2022 4.44  3.87 - 5.11 MIL/uL Final   Hemoglobin 03/22/2022 11.2 (L)  12.0 - 15.0 g/dL Final   HCT 03/22/2022 36.0  36.0 - 46.0 % Final   MCV 03/22/2022 81.1  80.0 - 100.0 fL Final   MCH 03/22/2022 25.2 (L)  26.0 - 34.0 pg Final   MCHC 03/22/2022 31.1  30.0 - 36.0 g/dL Final   RDW 03/22/2022 14.6  11.5 - 15.5 % Final   Platelets 03/22/2022 234  150 - 400 K/uL Final   nRBC 03/22/2022 0.0  0.0 - 0.2 % Final   Neutrophils Relative % 03/22/2022 61  % Final   Neutro Abs 03/22/2022 4.8  1.7 - 7.7 K/uL Final   Lymphocytes Relative 03/22/2022 35  % Final   Lymphs Abs 03/22/2022 2.8  0.7 - 4.0 K/uL Final   Monocytes Relative 03/22/2022 4  % Final   Monocytes Absolute 03/22/2022 0.4  0.1 - 1.0 K/uL Final   Eosinophils Relative 03/22/2022 0  % Final   Eosinophils Absolute 03/22/2022 0.0  0.0 - 0.5 K/uL Final   Basophils Relative 03/22/2022 0  % Final   Basophils Absolute 03/22/2022 0.0  0.0 - 0.1 K/uL Final   Immature Granulocytes 03/22/2022 0  % Final   Abs Immature Granulocytes 03/22/2022 0.02  0.00 - 0.07 K/uL Final   Performed at [facility], [place] [address] [place], [place] [address]   SARS Coronavirus 2 by RT PCR 03/22/2022 NEGATIVE  NEGATIVE Final   Comment: (NOTE) SARS-CoV-2 target nucleic acids are NOT DETECTED.  The SARS-CoV-2 RNA is generally detectable in upper respiratory specimens during the acute phase of infection. The lowest concentration of SARS-CoV-2 viral copies this assay can detect is 138 copies/mL. A negative result does not preclude SARS-Cov-2 infection and should not be used as the sole basis for treatment or other patient management decisions. A negative result may occur with  improper specimen collection/handling, submission of specimen other than nasopharyngeal swab, presence of viral mutation(s) within the areas targeted by this assay, and inadequate  number of viral copies(<138 copies/mL). A negative result must be combined with clinical observations, patient history, and epidemiological information. The expected result is Negative.  Fact Sheet for Patients:  EntrepreneurPulse.com.au  Fact Sheet for Healthcare Providers:  IncredibleEmployment.be  This test is no                          t yet approved or cleared by the [place] FDA and  has been authorized for detection and/or diagnosis of SARS-CoV-2 by FDA under an Emergency Use Authorization (EUA). This EUA will remain  in effect (meaning this test can be used) for the duration of the COVID-19 declaration under Section 564(b)(1) of the Act, 21 U.S.C.section 360bbb-3(b)(1), unless the authorization is terminated  or revoked sooner.       Influenza A by PCR 03/22/2022 NEGATIVE  NEGATIVE Final   Influenza B by PCR 03/22/2022 NEGATIVE  NEGATIVE Final   Comment: (NOTE) The Xpert Xpress SARS-CoV-2/FLU/RSV plus assay is intended as an aid in the diagnosis of influenza from Nasopharyngeal swab specimens and should not be used as a sole basis for treatment. Nasal washings and aspirates are unacceptable for Xpert Xpress SARS-CoV-2/FLU/RSV testing.  Fact Sheet for Patients: EntrepreneurPulse.com.au  Fact Sheet for Healthcare Providers: IncredibleEmployment.be  This test is not yet approved or cleared by the [place] FDA and has been authorized for detection and/or diagnosis of SARS-CoV-2 by FDA under  an Emergency Use Authorization (EUA). This EUA will remain in effect (meaning this test can be used) for the duration of the COVID-19 declaration under Section 564(b)(1) of the Act, 21 U.S.C. section 360bbb-3(b)(1), unless the authorization is terminated or revoked.     Resp Syncytial Virus by PCR 03/22/2022 NEGATIVE  NEGATIVE Final   Comment: (NOTE) Fact Sheet for  Patients: EntrepreneurPulse.com.au  Fact Sheet for Healthcare Providers: IncredibleEmployment.be  This test is not yet approved or cleared by the [place] FDA and has been authorized for detection and/or diagnosis of SARS-CoV-2 by FDA under an Emergency Use Authorization (EUA). This EUA will remain in effect (meaning this test can be used) for the duration of the COVID-19 declaration under Section 564(b)(1) of the Act, 21 U.S.C. section 360bbb-3(b)(1), unless the authorization is terminated or revoked.  Performed at [facility], [place] [address], [place], [place] [address]   Admission on 03/10/2022, Discharged on 03/11/2022  Component Date Value Ref Range Status   SARSCOV2ONAVIRUS 2 AG 03/10/2022 NEGATIVE  NEGATIVE Final   Comment: (NOTE) SARS-CoV-2 antigen NOT DETECTED.   Negative results are presumptive.  Negative results do not preclude SARS-CoV-2 infection and should not be used as the sole basis for treatment or other patient management decisions, including infection  control decisions, particularly in the presence of clinical signs and  symptoms consistent with COVID-19, or in those who have been in contact with the virus.  Negative results must be combined with clinical observations, patient history, and epidemiological information. The expected result is Negative.  Fact Sheet for Patients: HandmadeRecipes.com.cy  Fact Sheet for Healthcare Providers: FuneralLife.at  This test is not yet approved or cleared by the [place] FDA and  has been authorized for detection and/or diagnosis of SARS-CoV-2 by FDA under an Emergency Use Authorization (EUA).  This EUA will remain in effect (meaning this test can be used) for the duration of  the COV                          ID-19 declaration under Section 564(b)(1) of the Act, 21 U.S.C. section 360bbb-3(b)(1), unless the  authorization is terminated or revoked sooner.     SARS Coronavirus 2 by RT PCR 03/10/2022 NEGATIVE  NEGATIVE Final   Influenza A by PCR 03/10/2022 NEGATIVE  NEGATIVE Final   Influenza B by PCR 03/10/2022 NEGATIVE  NEGATIVE Final   Comment: (NOTE) The Xpert Xpress SARS-CoV-2/FLU/RSV plus assay is intended as an aid in the diagnosis of influenza from Nasopharyngeal swab specimens and should not be used as a sole basis for treatment. Nasal washings and aspirates are unacceptable for Xpert Xpress SARS-CoV-2/FLU/RSV testing.  Fact Sheet for Patients: EntrepreneurPulse.com.au  Fact Sheet for Healthcare Providers: IncredibleEmployment.be  This test is not yet approved or cleared by the [place] FDA and has been authorized for detection and/or diagnosis of SARS-CoV-2 by FDA under an Emergency Use Authorization (EUA). This EUA will remain in effect (meaning this test can be used) for the duration of the COVID-19 declaration under Section 564(b)(1) of the Act, 21 U.S.C. section 360bbb-3(b)(1), unless the authorization is terminated or revoked.     Resp Syncytial Virus by PCR 03/10/2022 NEGATIVE  NEGATIVE Final   Comment: (NOTE) Fact Sheet for Patients: EntrepreneurPulse.com.au  Fact Sheet for Healthcare Providers: IncredibleEmployment.be  This test is not yet approved or cleared by the [place] FDA and has been authorized for detection and/or diagnosis of SARS-CoV-2 by FDA under an Emergency  Use Authorization (EUA). This EUA will remain in effect (meaning this test can be used) for the duration of the COVID-19 declaration under Section 564(b)(1) of the Act, 21 U.S.C. section 360bbb-3(b)(1), unless the authorization is terminated or revoked.  Performed at [facility], [place] [address], [place], [place] [address]    WBC 03/10/2022 6.8  4.0 - 10.5 K/uL Final   RBC 03/10/2022 5.04  3.87 - 5.11  MIL/uL Final   Hemoglobin 03/10/2022 12.6  12.0 - 15.0 g/dL Final   HCT 03/10/2022 39.6  36.0 - 46.0 % Final   MCV 03/10/2022 78.6 (L)  80.0 - 100.0 fL Final   MCH 03/10/2022 25.0 (L)  26.0 - 34.0 pg Final   MCHC 03/10/2022 31.8  30.0 - 36.0 g/dL Final   RDW 03/10/2022 13.9  11.5 - 15.5 % Final   Platelets 03/10/2022 338  150 - 400 K/uL Final   nRBC 03/10/2022 0.0  0.0 - 0.2 % Final   Neutrophils Relative % 03/10/2022 52  % Final   Neutro Abs 03/10/2022 3.5  1.7 - 7.7 K/uL Final   Lymphocytes Relative 03/10/2022 42  % Final   Lymphs Abs 03/10/2022 2.8  0.7 - 4.0 K/uL Final   Monocytes Relative 03/10/2022 5  % Final   Monocytes Absolute 03/10/2022 0.4  0.1 - 1.0 K/uL Final   Eosinophils Relative 03/10/2022 1  % Final   Eosinophils Absolute 03/10/2022 0.0  0.0 - 0.5 K/uL Final   Basophils Relative 03/10/2022 0  % Final   Basophils Absolute 03/10/2022 0.0  0.0 - 0.1 K/uL Final   Immature Granulocytes 03/10/2022 0  % Final   Abs Immature Granulocytes 03/10/2022 0.02  0.00 - 0.07 K/uL Final   Performed at [facility], [place] [address] [place], [place] [address]   Sodium 03/10/2022 137  135 - 145 mmol/L Final   Potassium 03/10/2022 3.7  3.5 - 5.1 mmol/L Final   Chloride 03/10/2022 105  98 - 111 mmol/L Final   CO2 03/10/2022 25  22 - 32 mmol/L Final   Glucose, Bld 03/10/2022 80  70 - 99 mg/dL Final   Glucose reference range applies only to samples taken after fasting for at least 8 hours.   BUN 03/10/2022 7  6 - 20 mg/dL Final   Creatinine, Ser 03/10/2022 0.72  0.44 - 1.00 mg/dL Final   Calcium 03/10/2022 9.2  8.9 - 10.3 mg/dL Final   Total Protein 03/10/2022 7.4  6.5 - 8.1 g/dL Final   Albumin 03/10/2022 4.3  3.5 - 5.0 g/dL Final   AST 03/10/2022 22  15 - 41 U/L Final   ALT 03/10/2022 17  0 - 44 U/L Final   Alkaline Phosphatase 03/10/2022 60  38 - 126 U/L Final   Total Bilirubin 03/10/2022 0.3  0.3 - 1.2 mg/dL Final   GFR, Estimated 03/10/2022 >60  >60 mL/min Final   Comment:  (NOTE) Calculated using the CKD-EPI Creatinine Equation (2021)    Anion gap 03/10/2022 7  5 - 15 Final   Performed at [place] [address], [place], [place] [address]   Alcohol, Ethyl (B) 03/10/2022 <10  <10 mg/dL Final   Comment: (NOTE) Lowest detectable limit for serum alcohol is 10 mg/dL.  For medical purposes only. Performed at [facility], [place] [address], [place], [place] [address]    Preg Test, Ur 03/10/2022 NEGATIVE  NEGATIVE Final   Comment:        THE SENSITIVITY  OF THIS METHODOLOGY IS >24 mIU/mL   Admission on 03/03/2022, Discharged on 03/03/2022  Component Date Value Ref Range Status   Color, Urine 03/03/2022 YELLOW  YELLOW Final   APPearance 03/03/2022 HAZY (A)  CLEAR Final   Specific Gravity, Urine 03/03/2022 1.017  1.005 - 1.030 Final   pH 03/03/2022 5.0  5.0 - 8.0 Final   Glucose, UA 03/03/2022 NEGATIVE  NEGATIVE mg/dL Final   Hgb urine dipstick 03/03/2022 NEGATIVE  NEGATIVE Final   Bilirubin Urine 03/03/2022 NEGATIVE  NEGATIVE Final   Ketones, ur 03/03/2022 5 (A)  NEGATIVE mg/dL Final   Protein, ur 03/03/2022 NEGATIVE  NEGATIVE mg/dL Final   Nitrite 03/03/2022 NEGATIVE  NEGATIVE Final   Leukocytes,Ua 03/03/2022 NEGATIVE  NEGATIVE Final   Performed at [facility], [place] [address] [place], [place] [address]  Admission on 03/02/2022, Discharged on 03/02/2022  Component Date Value Ref Range Status   SARS Coronavirus 2 by RT PCR 03/02/2022 NEGATIVE  NEGATIVE Final   Comment: (NOTE) SARS-CoV-2 target nucleic acids are NOT DETECTED.  The SARS-CoV-2 RNA is generally detectable in upper respiratory specimens during the acute phase of infection. The lowest concentration of SARS-CoV-2 viral copies this assay can detect is 138 copies/mL. A negative result does not preclude SARS-Cov-2 infection and should not be used as the sole basis for treatment or other patient management decisions. A negative result may occur with  improper specimen  collection/handling, submission of specimen other than nasopharyngeal swab, presence of viral mutation(s) within the areas targeted by this assay, and inadequate number of viral copies(<138 copies/mL). A negative result must be combined with clinical observations, patient history, and epidemiological information. The expected result is Negative.  Fact Sheet for Patients:  EntrepreneurPulse.com.au  Fact Sheet for Healthcare Providers:  IncredibleEmployment.be  This test is no                          t yet approved or cleared by the [place] FDA and  has been authorized for detection and/or diagnosis of SARS-CoV-2 by FDA under an Emergency Use Authorization (EUA). This EUA will remain  in effect (meaning this test can be used) for the duration of the COVID-19 declaration under Section 564(b)(1) of the Act, 21 U.S.C.section 360bbb-3(b)(1), unless the authorization is terminated  or revoked sooner.       Influenza A by PCR 03/02/2022 POSITIVE (A)  NEGATIVE Final   Influenza B by PCR 03/02/2022 NEGATIVE  NEGATIVE Final   Comment: (NOTE) The Xpert Xpress SARS-CoV-2/FLU/RSV plus assay is intended as an aid in the diagnosis of influenza from Nasopharyngeal swab specimens and should not be used as a sole basis for treatment. Nasal washings and aspirates are unacceptable for Xpert Xpress SARS-CoV-2/FLU/RSV testing.  Fact Sheet for Patients: EntrepreneurPulse.com.au  Fact Sheet for Healthcare Providers: IncredibleEmployment.be  This test is not yet approved or cleared by the [place] FDA and has been authorized for detection and/or diagnosis of SARS-CoV-2 by FDA under an Emergency Use Authorization (EUA). This EUA will remain in effect (meaning this test can be used) for the duration of the COVID-19 declaration under Section 564(b)(1) of the Act, 21 U.S.C. section 360bbb-3(b)(1), unless the authorization  is terminated or revoked.     Resp Syncytial Virus by PCR 03/02/2022 NEGATIVE  NEGATIVE Final   Comment: (NOTE) Fact Sheet for Patients: EntrepreneurPulse.com.au  Fact Sheet for Healthcare Providers: IncredibleEmployment.be  This test is not yet approved or cleared by the [place]  FDA and has been authorized for detection and/or diagnosis of SARS-CoV-2 by FDA under an Emergency Use Authorization (EUA). This EUA will remain in effect (meaning this test can be used) for the duration of the COVID-19 declaration under Section 564(b)(1) of the Act, 21 U.S.C. section 360bbb-3(b)(1), unless the authorization is terminated or revoked.  Performed at [facility], [place] [address] [place], [place] [address]   Admission on 02/23/2022, Discharged on 02/23/2022  Component Date Value Ref Range Status   Lipase 02/23/2022 32  11 - 51 U/L Final   Performed at [facility], [place] [address], [place], [place] [address]   Sodium 02/23/2022 137  135 - 145 mmol/L Final   Potassium 02/23/2022 3.8  3.5 - 5.1 mmol/L Final   Chloride 02/23/2022 107  98 - 111 mmol/L Final   CO2 02/23/2022 21 (L)  22 - 32 mmol/L Final   Glucose, Bld 02/23/2022 104 (H)  70 - 99 mg/dL Final   Glucose reference range applies only to samples taken after fasting for at least 8 hours.   BUN 02/23/2022 9  6 - 20 mg/dL Final   Creatinine, Ser 02/23/2022 0.71  0.44 - 1.00 mg/dL Final   Calcium 02/23/2022 8.4 (L)  8.9 - 10.3 mg/dL Final   Total Protein 02/23/2022 6.8  6.5 - 8.1 g/dL Final   Albumin 02/23/2022 3.7  3.5 - 5.0 g/dL Final   AST 02/23/2022 15  15 - 41 U/L Final   ALT 02/23/2022 15  0 - 44 U/L Final   Alkaline Phosphatase 02/23/2022 68  38 - 126 U/L Final   Total Bilirubin 02/23/2022 0.6  0.3 - 1.2 mg/dL Final   GFR, Estimated 02/23/2022 >60  >60 mL/min Final   Comment: (NOTE) Calculated using the CKD-EPI Creatinine Equation (2021)    Anion gap  02/23/2022 9  5 - 15 Final   Performed at [facility], [place] [address], [place], [place] [address]   WBC 02/23/2022 7.7  4.0 - 10.5 K/uL Final   RBC 02/23/2022 4.88  3.87 - 5.11 MIL/uL Final   Hemoglobin 02/23/2022 12.3  12.0 - 15.0 g/dL Final   HCT 02/23/2022 40.4  36.0 - 46.0 % Final   MCV 02/23/2022 82.8  80.0 - 100.0 fL Final   MCH 02/23/2022 25.2 (L)  26.0 - 34.0 pg Final   MCHC 02/23/2022 30.4  30.0 - 36.0 g/dL Final   RDW 02/23/2022 14.1  11.5 - 15.5 % Final   Platelets 02/23/2022 213  150 - 400 K/uL Final   nRBC 02/23/2022 0.0  0.0 - 0.2 % Final   Performed at [facility], [place] [address], [place], [place] [address]   I-stat hCG, quantitative 02/23/2022 <5.0  <5 mIU/mL Final   Comment 3 02/23/2022          Final   Comment:   GEST. AGE      CONC.  (mIU/mL)   <=1 WEEK        5 - 50     2 WEEKS       50 - 500     3 WEEKS       100 - 10,000     4 WEEKS     1,000 - 30,000        FEMALE AND NON-PREGNANT FEMALE:     LESS THAN 5 mIU/mL   Admission on 02/21/2022, Discharged on 02/22/2022  Component Date Value Ref Range Status   SARS Coronavirus  2 by RT PCR 02/21/2022 NEGATIVE  NEGATIVE Final   Comment: (NOTE) SARS-CoV-2 target nucleic acids are NOT DETECTED.  The SARS-CoV-2 RNA is generally detectable in upper respiratory specimens during the acute phase of infection. The lowest concentration of SARS-CoV-2 viral copies this assay can detect is 138 copies/mL. A negative result does not preclude SARS-Cov-2 infection and should not be used as the sole basis for treatment or other patient management decisions. A negative result may occur with  improper specimen collection/handling, submission of specimen other than nasopharyngeal swab, presence of viral mutation(s) within the areas targeted by this assay, and inadequate number of viral copies(<138 copies/mL). A negative result must be combined with clinical observations, patient history, and epidemiological information.  The expected result is Negative.  Fact Sheet for Patients:  EntrepreneurPulse.com.au  Fact Sheet for Healthcare Providers:  IncredibleEmployment.be  This test is no                          t yet approved or cleared by the [place] FDA and  has been authorized for detection and/or diagnosis of SARS-CoV-2 by FDA under an Emergency Use Authorization (EUA). This EUA will remain  in effect (meaning this test can be used) for the duration of the COVID-19 declaration under Section 564(b)(1) of the Act, 21 U.S.C.section 360bbb-3(b)(1), unless the authorization is terminated  or revoked sooner.       Influenza A by PCR 02/21/2022 NEGATIVE  NEGATIVE Final   Influenza B by PCR 02/21/2022 NEGATIVE  NEGATIVE Final   Comment: (NOTE) The Xpert Xpress SARS-CoV-2/FLU/RSV plus assay is intended as an aid in the diagnosis of influenza from Nasopharyngeal swab specimens and should not be used as a sole basis for treatment. Nasal washings and aspirates are unacceptable for Xpert Xpress SARS-CoV-2/FLU/RSV testing.  Fact Sheet for Patients: EntrepreneurPulse.com.au  Fact Sheet for Healthcare Providers: IncredibleEmployment.be  This test is not yet approved or cleared by the [place] FDA and has been authorized for detection and/or diagnosis of SARS-CoV-2 by FDA under an Emergency Use Authorization (EUA). This EUA will remain in effect (meaning this test can be used) for the duration of the COVID-19 declaration under Section 564(b)(1) of the Act, 21 U.S.C. section 360bbb-3(b)(1), unless the authorization is terminated or revoked.     Resp Syncytial Virus by PCR 02/21/2022 NEGATIVE  NEGATIVE Final   Comment: (NOTE) Fact Sheet for Patients: EntrepreneurPulse.com.au  Fact Sheet for Healthcare Providers: IncredibleEmployment.be  This test is not yet approved or cleared by the  [place] FDA and has been authorized for detection and/or diagnosis of SARS-CoV-2 by FDA under an Emergency Use Authorization (EUA). This EUA will remain in effect (meaning this test can be used) for the duration of the COVID-19 declaration under Section 564(b)(1) of the Act, 21 U.S.C. section 360bbb-3(b)(1), unless the authorization is terminated or revoked.  Performed at [facility], [place] [address] [place], [place] [address]    Preg Test, Ur 02/22/2022 Negative  Negative Final   POC Amphetamine UR 02/22/2022 None Detected  NONE DETECTED (Cut Off Level 1000 ng/mL) Final   POC Secobarbital (BAR) 02/22/2022 None Detected  NONE DETECTED (Cut Off Level 300 ng/mL) Final   POC Buprenorphine (BUP) 02/22/2022 None Detected  NONE DETECTED (Cut Off Level 10 ng/mL) Final   POC Oxazepam (BZO) 02/22/2022 None Detected  NONE DETECTED (Cut Off Level 300 ng/mL) Final   POC Cocaine UR 02/22/2022 None Detected  NONE DETECTED (Cut Off  Level 300 ng/mL) Final   POC Methamphetamine UR 02/22/2022 None Detected  NONE DETECTED (Cut Off Level 1000 ng/mL) Final   POC Morphine 02/22/2022 None Detected  NONE DETECTED (Cut Off Level 300 ng/mL) Final   POC Methadone UR 02/22/2022 None Detected  NONE DETECTED (Cut Off Level 300 ng/mL) Final   POC Oxycodone UR 02/22/2022 None Detected  NONE DETECTED (Cut Off Level 100 ng/mL) Final   POC Marijuana UR 02/22/2022 None Detected  NONE DETECTED (Cut Off Level 50 ng/mL) Final   SARSCOV2ONAVIRUS 2 AG 02/21/2022 NEGATIVE  NEGATIVE Final   Comment: (NOTE) SARS-CoV-2 antigen NOT DETECTED.   Negative results are presumptive.  Negative results do not preclude SARS-CoV-2 infection and should not be used as the sole basis for treatment or other patient management decisions, including infection  control decisions, particularly in the presence of clinical signs and  symptoms consistent with COVID-19, or in those who have been in contact with the virus.  Negative results  must be combined with clinical observations, patient history, and epidemiological information. The expected result is Negative.  Fact Sheet for Patients: HandmadeRecipes.com.cy  Fact Sheet for Healthcare Providers: FuneralLife.at  This test is not yet approved or cleared by the [place] FDA and  has been authorized for detection and/or diagnosis of SARS-CoV-2 by FDA under an Emergency Use Authorization (EUA).  This EUA will remain in effect (meaning this test can be used) for the duration of  the COV                          ID-19 declaration under Section 564(b)(1) of the Act, 21 U.S.C. section 360bbb-3(b)(1), unless the authorization is terminated or revoked sooner.     Preg Test, Ur 02/22/2022 NEGATIVE  NEGATIVE Final   Comment:        THE SENSITIVITY OF THIS METHODOLOGY IS >24 mIU/mL   Admission on 02/15/2022, Discharged on 02/15/2022  Component Date Value Ref Range Status   Sodium 02/15/2022 136  135 - 145 mmol/L Final   Potassium 02/15/2022 4.3  3.5 - 5.1 mmol/L Final   Chloride 02/15/2022 103  98 - 111 mmol/L Final   CO2 02/15/2022 22  22 - 32 mmol/L Final   Glucose, Bld 02/15/2022 107 (H)  70 - 99 mg/dL Final   Glucose reference range applies only to samples taken after fasting for at least 8 hours.   BUN 02/15/2022 7  6 - 20 mg/dL Final   Creatinine, Ser 02/15/2022 0.66  0.44 - 1.00 mg/dL Final   Calcium 02/15/2022 9.1  8.9 - 10.3 mg/dL Final   GFR, Estimated 02/15/2022 >60  >60 mL/min Final   Comment: (NOTE) Calculated using the CKD-EPI Creatinine Equation (2021)    Anion gap 02/15/2022 11  5 - 15 Final   Performed at [facility], [place] [address], [place], [place] [address]   WBC 02/15/2022 10.2  4.0 - 10.5 K/uL Final   RBC 02/15/2022 5.24 (H)  3.87 - 5.11 MIL/uL Final   Hemoglobin 02/15/2022 13.3  12.0 - 15.0 g/dL Final   HCT 02/15/2022 43.0  36.0 - 46.0 % Final   MCV 02/15/2022 82.1  80.0 - 100.0 fL  Final   MCH 02/15/2022 25.4 (L)  26.0 - 34.0 pg Final   MCHC 02/15/2022 30.9  30.0 - 36.0 g/dL Final   RDW 02/15/2022 13.9  11.5 - 15.5 % Final   Platelets 02/15/2022 329  150 - 400 K/uL Final  nRBC 02/15/2022 0.0  0.0 - 0.2 % Final   Performed at [facility], [place] [address], [place], [place] [address]   Color, Urine 02/15/2022 YELLOW  YELLOW Final   APPearance 02/15/2022 CLEAR  CLEAR Final   Specific Gravity, Urine 02/15/2022 1.009  1.005 - 1.030 Final   pH 02/15/2022 6.0  5.0 - 8.0 Final   Glucose, UA 02/15/2022 NEGATIVE  NEGATIVE mg/dL Final   Hgb urine dipstick 02/15/2022 NEGATIVE  NEGATIVE Final   Bilirubin Urine 02/15/2022 NEGATIVE  NEGATIVE Final   Ketones, ur 02/15/2022 NEGATIVE  NEGATIVE mg/dL Final   Protein, ur 02/15/2022 NEGATIVE  NEGATIVE mg/dL Final   Nitrite 02/15/2022 NEGATIVE  NEGATIVE Final   Leukocytes,Ua 02/15/2022 NEGATIVE  NEGATIVE Final   Performed at [facility], [place] [address] [place], [place] [address]   I-stat hCG, quantitative 02/15/2022 <5.0  <5 mIU/mL Final   Comment 3 02/15/2022          Final   Comment:   GEST. AGE      CONC.  (mIU/mL)   <=1 WEEK        5 - 50     2 WEEKS       50 - 500     3 WEEKS       100 - 10,000     4 WEEKS     1,000 - 30,000        FEMALE AND NON-PREGNANT FEMALE:     LESS THAN 5 mIU/mL   Admission on 02/10/2022, Discharged on 02/11/2022  Component Date Value Ref Range Status   SARS Coronavirus 2 by RT PCR 02/10/2022 NEGATIVE  NEGATIVE Final   Comment: (NOTE) SARS-CoV-2 target nucleic acids are NOT DETECTED.  The SARS-CoV-2 RNA is generally detectable in upper respiratory specimens during the acute phase of infection. The lowest concentration of SARS-CoV-2 viral copies this assay can detect is 138 copies/mL. A negative result does not preclude SARS-Cov-2 infection and should not be used as the sole basis for treatment or other patient management decisions. A negative result may occur with  improper specimen  collection/handling, submission of specimen other than nasopharyngeal swab, presence of viral mutation(s) within the areas targeted by this assay, and inadequate number of viral copies(<138 copies/mL). A negative result must be combined with clinical observations, patient history, and epidemiological information. The expected result is Negative.  Fact Sheet for Patients:  EntrepreneurPulse.com.au  Fact Sheet for Healthcare Providers:  IncredibleEmployment.be  This test is no                          t yet approved or cleared by the [place] FDA and  has been authorized for detection and/or diagnosis of SARS-CoV-2 by FDA under an Emergency Use Authorization (EUA). This EUA will remain  in effect (meaning this test can be used) for the duration of the COVID-19 declaration under Section 564(b)(1) of the Act, 21 U.S.C.section 360bbb-3(b)(1), unless the authorization is terminated  or revoked sooner.       Influenza A by PCR 02/10/2022 NEGATIVE  NEGATIVE Final   Influenza B by PCR 02/10/2022 NEGATIVE  NEGATIVE Final   Comment: (NOTE) The Xpert Xpress SARS-CoV-2/FLU/RSV plus assay is intended as an aid in the diagnosis of influenza from Nasopharyngeal swab specimens and should not be used as a sole basis for treatment. Nasal washings and aspirates are unacceptable for Xpert Xpress SARS-CoV-2/FLU/RSV testing.  Fact Sheet for Patients: EntrepreneurPulse.com.au  Fact Sheet for Healthcare Providers: IncredibleEmployment.be  This test is not yet approved or cleared by the [place] FDA and has been authorized for detection and/or diagnosis of SARS-CoV-2 by FDA under an Emergency Use Authorization (EUA). This EUA will remain in effect (meaning this test can be used) for the duration of the COVID-19 declaration under Section 564(b)(1) of the Act, 21 U.S.C. section 360bbb-3(b)(1), unless the authorization is  terminated or revoked.     Resp Syncytial Virus by PCR 02/10/2022 NEGATIVE  NEGATIVE Final   Comment: (NOTE) Fact Sheet for Patients: EntrepreneurPulse.com.au  Fact Sheet for Healthcare Providers: IncredibleEmployment.be  This test is not yet approved or cleared by the [place] FDA and has been authorized for detection and/or diagnosis of SARS-CoV-2 by FDA under an Emergency Use Authorization (EUA). This EUA will remain in effect (meaning this test can be used) for the duration of the COVID-19 declaration under Section 564(b)(1) of the Act, 21 U.S.C. section 360bbb-3(b)(1), unless the authorization is terminated or revoked.  Performed at [facility], [place] [address] [place], [place] [address]    Preg Test, Ur 02/10/2022 Negative  Negative Preliminary   POC Amphetamine UR 02/10/2022 None Detected  NONE DETECTED (Cut Off Level 1000 ng/mL) Preliminary   POC Secobarbital (BAR) 02/10/2022 None Detected  NONE DETECTED (Cut Off Level 300 ng/mL) Preliminary   POC Buprenorphine (BUP) 02/10/2022 None Detected  NONE DETECTED (Cut Off Level 10 ng/mL) Preliminary   POC Oxazepam (BZO) 02/10/2022 None Detected  NONE DETECTED (Cut Off Level 300 ng/mL) Preliminary   POC Cocaine UR 02/10/2022 None Detected  NONE DETECTED (Cut Off Level 300 ng/mL) Preliminary   POC Methamphetamine UR 02/10/2022 None Detected (A)  NONE DETECTED (Cut Off Level 1000 ng/mL) Preliminary   POC Morphine 02/10/2022 None Detected  NONE DETECTED (Cut Off Level 300 ng/mL) Preliminary   POC Methadone UR 02/10/2022 None Detected  NONE DETECTED (Cut Off Level 300 ng/mL) Preliminary   POC Oxycodone UR 02/10/2022 None Detected  NONE DETECTED (Cut Off Level 100 ng/mL) Preliminary   POC Marijuana UR 02/10/2022 None Detected  NONE DETECTED (Cut Off Level 50 ng/mL) Preliminary   SARSCOV2ONAVIRUS 2 AG 02/10/2022 NEGATIVE  NEGATIVE Final   Comment: (NOTE) SARS-CoV-2 antigen NOT DETECTED.    Negative results are presumptive.  Negative results do not preclude SARS-CoV-2 infection and should not be used as the sole basis for treatment or other patient management decisions, including infection  control decisions, particularly in the presence of clinical signs and  symptoms consistent with COVID-19, or in those who have been in contact with the virus.  Negative results must be combined with clinical observations, patient history, and epidemiological information. The expected result is Negative.  Fact Sheet for Patients: HandmadeRecipes.com.cy  Fact Sheet for Healthcare Providers: FuneralLife.at  This test is not yet approved or cleared by the [place] FDA and  has been authorized for detection and/or diagnosis of SARS-CoV-2 by FDA under an Emergency Use Authorization (EUA).  This EUA will remain in effect (meaning this test can be used) for the duration of  the COV                          ID-19 declaration under Section 564(b)(1) of the Act, 21 U.S.C. section 360bbb-3(b)(1), unless the authorization is terminated or revoked sooner.     Preg Test, Ur 02/10/2022 NEGATIVE  NEGATIVE Final   Comment:        THE SENSITIVITY  OF THIS METHODOLOGY IS >24 mIU/mL   Admission on 01/30/2022, Discharged on 01/31/2022  Component Date Value Ref Range Status   SARS Coronavirus 2 by RT PCR 01/30/2022 NEGATIVE  NEGATIVE Final   Comment: (NOTE) SARS-CoV-2 target nucleic acids are NOT DETECTED.  The SARS-CoV-2 RNA is generally detectable in upper respiratory specimens during the acute phase of infection. The lowest concentration of SARS-CoV-2 viral copies this assay can detect is 138 copies/mL. A negative result does not preclude SARS-Cov-2 infection and should not be used as the sole basis for treatment or other patient management decisions. A negative result may occur with  improper specimen collection/handling, submission of  specimen other than nasopharyngeal swab, presence of viral mutation(s) within the areas targeted by this assay, and inadequate number of viral copies(<138 copies/mL). A negative result must be combined with clinical observations, patient history, and epidemiological information. The expected result is Negative.  Fact Sheet for Patients:  EntrepreneurPulse.com.au  Fact Sheet for Healthcare Providers:  IncredibleEmployment.be  This test is no                          t yet approved or cleared by the [place] FDA and  has been authorized for detection and/or diagnosis of SARS-CoV-2 by FDA under an Emergency Use Authorization (EUA). This EUA will remain  in effect (meaning this test can be used) for the duration of the COVID-19 declaration under Section 564(b)(1) of the Act, 21 U.S.C.section 360bbb-3(b)(1), unless the authorization is terminated  or revoked sooner.       Influenza A by PCR 01/30/2022 NEGATIVE  NEGATIVE Final   Influenza B by PCR 01/30/2022 NEGATIVE  NEGATIVE Final   Comment: (NOTE) The Xpert Xpress SARS-CoV-2/FLU/RSV plus assay is intended as an aid in the diagnosis of influenza from Nasopharyngeal swab specimens and should not be used as a sole basis for treatment. Nasal washings and aspirates are unacceptable for Xpert Xpress SARS-CoV-2/FLU/RSV testing.  Fact Sheet for Patients: EntrepreneurPulse.com.au  Fact Sheet for Healthcare Providers: IncredibleEmployment.be  This test is not yet approved or cleared by the [place] FDA and has been authorized for detection and/or diagnosis of SARS-CoV-2 by FDA under an Emergency Use Authorization (EUA). This EUA will remain in effect (meaning this test can be used) for the duration of the COVID-19 declaration under Section 564(b)(1) of the Act, 21 U.S.C. section 360bbb-3(b)(1), unless the authorization is terminated or revoked.     Resp  Syncytial Virus by PCR 01/30/2022 NEGATIVE  NEGATIVE Final   Comment: (NOTE) Fact Sheet for Patients: EntrepreneurPulse.com.au  Fact Sheet for Healthcare Providers: IncredibleEmployment.be  This test is not yet approved or cleared by the [place] FDA and has been authorized for detection and/or diagnosis of SARS-CoV-2 by FDA under an Emergency Use Authorization (EUA). This EUA will remain in effect (meaning this test can be used) for the duration of the COVID-19 declaration under Section 564(b)(1) of the Act, 21 U.S.C. section 360bbb-3(b)(1), unless the authorization is terminated or revoked.  Performed at [facility], [place] [address] [place], [place] [address]    WBC 01/30/2022 12.5 (H)  4.0 - 10.5 K/uL Final   RBC 01/30/2022 5.25 (H)  3.87 - 5.11 MIL/uL Final   Hemoglobin 01/30/2022 13.4  12.0 - 15.0 g/dL Final   HCT 01/30/2022 42.8  36.0 - 46.0 % Final   MCV 01/30/2022 81.5  80.0 - 100.0 fL Final   MCH 01/30/2022 25.5 (L)  26.0 -  34.0 pg Final   MCHC 01/30/2022 31.3  30.0 - 36.0 g/dL Final   RDW 01/30/2022 14.0  11.5 - 15.5 % Final   Platelets 01/30/2022 293  150 - 400 K/uL Final   nRBC 01/30/2022 0.0  0.0 - 0.2 % Final   Neutrophils Relative % 01/30/2022 66  % Final   Neutro Abs 01/30/2022 8.2 (H)  1.7 - 7.7 K/uL Final   Lymphocytes Relative 01/30/2022 28  % Final   Lymphs Abs 01/30/2022 3.5  0.7 - 4.0 K/uL Final   Monocytes Relative 01/30/2022 5  % Final   Monocytes Absolute 01/30/2022 0.6  0.1 - 1.0 K/uL Final   Eosinophils Relative 01/30/2022 1  % Final   Eosinophils Absolute 01/30/2022 0.1  0.0 - 0.5 K/uL Final   Basophils Relative 01/30/2022 0  % Final   Basophils Absolute 01/30/2022 0.0  0.0 - 0.1 K/uL Final   Immature Granulocytes 01/30/2022 0  % Final   Abs Immature Granulocytes 01/30/2022 0.04  0.00 - 0.07 K/uL Final   Performed at [facility], [place] [address] [place], [place] [address]   Sodium 01/30/2022  139  135 - 145 mmol/L Final   Potassium 01/30/2022 3.8  3.5 - 5.1 mmol/L Final   Chloride 01/30/2022 108  98 - 111 mmol/L Final   CO2 01/30/2022 23  22 - 32 mmol/L Final   Glucose, Bld 01/30/2022 92  70 - 99 mg/dL Final   Glucose reference range applies only to samples taken after fasting for at least 8 hours.   BUN 01/30/2022 14  6 - 20 mg/dL Final   Creatinine, Ser 01/30/2022 0.92  0.44 - 1.00 mg/dL Final   Calcium 01/30/2022 9.7  8.9 - 10.3 mg/dL Final   Total Protein 01/30/2022 8.2 (H)  6.5 - 8.1 g/dL Final   Albumin 01/30/2022 4.6  3.5 - 5.0 g/dL Final   AST 01/30/2022 18  15 - 41 U/L Final   ALT 01/30/2022 15  0 - 44 U/L Final   Alkaline Phosphatase 01/30/2022 65  38 - 126 U/L Final   Total Bilirubin 01/30/2022 0.4  0.3 - 1.2 mg/dL Final   GFR, Estimated 01/30/2022 >60  >60 mL/min Final   Comment: (NOTE) Calculated using the CKD-EPI Creatinine Equation (2021)    Anion gap 01/30/2022 8  5 - 15 Final   Performed at [place] [address], [place], [place] [address]   Preg Test, Ur 01/30/2022 Negative  Negative Final   POC Amphetamine UR 01/30/2022 None Detected  NONE DETECTED (Cut Off Level 1000 ng/mL) Final   POC Secobarbital (BAR) 01/30/2022 None Detected  NONE DETECTED (Cut Off Level 300 ng/mL) Final   POC Buprenorphine (BUP) 01/30/2022 None Detected  NONE DETECTED (Cut Off Level 10 ng/mL) Final   POC Oxazepam (BZO) 01/30/2022 None Detected  NONE DETECTED (Cut Off Level 300 ng/mL) Final   POC Cocaine UR 01/30/2022 None Detected  NONE DETECTED (Cut Off Level 300 ng/mL) Final   POC Methamphetamine UR 01/30/2022 None Detected  NONE DETECTED (Cut Off Level 1000 ng/mL) Final   POC Morphine 01/30/2022 None Detected  NONE DETECTED (Cut Off Level 300 ng/mL) Final   POC Methadone UR 01/30/2022 None Detected  NONE DETECTED (Cut Off Level 300 ng/mL) Final   POC Oxycodone UR 01/30/2022 None Detected  NONE DETECTED (Cut Off Level 100 ng/mL) Final   POC Marijuana UR 01/30/2022  None Detected  NONE DETECTED (Cut Off Level 50 ng/mL) Final   SARSCOV2ONAVIRUS  2 AG 01/30/2022 NEGATIVE  NEGATIVE Final   Comment: (NOTE) SARS-CoV-2 antigen NOT DETECTED.   Negative results are presumptive.  Negative results do not preclude SARS-CoV-2 infection and should not be used as the sole basis for treatment or other patient management decisions, including infection  control decisions, particularly in the presence of clinical signs and  symptoms consistent with COVID-19, or in those who have been in contact with the virus.  Negative results must be combined with clinical observations, patient history, and epidemiological information. The expected result is Negative.  Fact Sheet for Patients: HandmadeRecipes.com.cy  Fact Sheet for Healthcare Providers: FuneralLife.at  This test is not yet approved or cleared by the [place] FDA and  has been authorized for detection and/or diagnosis of SARS-CoV-2 by FDA under an Emergency Use Authorization (EUA).  This EUA will remain in effect (meaning this test can be used) for the duration of  the COV                          ID-19 declaration under Section 564(b)(1) of the Act, 21 U.S.C. section 360bbb-3(b)(1), unless the authorization is terminated or revoked sooner.    There may be more visits with results that are not included.    Allergies: Bee venom, Coconut flavor, Fish allergy, Geodon [ziprasidone hcl], Haloperidol and related, Lithobid [lithium], Roxicodone [oxycodone], Seroquel [quetiapine], Shellfish allergy, Phenergan [promethazine hcl], Prilosec [omeprazole], Sulfa antibiotics, Tegretol [carbamazepine], Prozac [fluoxetine], Tape, and Tylenol [acetaminophen]  Medications:  Facility Ordered Medications  Medication   alum & mag hydroxide-simeth (MAALOX/MYLANTA) 200-200-20 MG/5ML suspension 30 mL   magnesium hydroxide (MILK OF MAGNESIA) suspension 30 mL   OLANZapine zydis  (ZYPREXA) disintegrating tablet 10 mg   And   LORazepam (ATIVAN) tablet 1 mg   [COMPLETED] melatonin tablet 3 mg   [COMPLETED] hydrOXYzine (ATARAX) tablet 25 mg   cariprazine (VRAYLAR) capsule 3 mg   pregabalin (LYRICA) capsule 100 mg   prazosin (MINIPRESS) capsule 1 mg   prazosin (MINIPRESS) capsule 1 mg   pantoprazole (PROTONIX) EC tablet 40 mg   pantoprazole (PROTONIX) EC tablet 40 mg   ondansetron (ZOFRAN) tablet 4 mg   metoprolol succinate (TOPROL-XL) 24 hr tablet 12.5 mg   melatonin tablet 3 mg   hydrOXYzine (ATARAX) tablet 25 mg   fluticasone furoate-vilanterol (BREO ELLIPTA) 200-25 MCG/ACT 2 puff   cyclobenzaprine (FLEXERIL) tablet 10 mg   escitalopram (LEXAPRO) tablet 10 mg   PTA Medications  Medication Sig   albuterol (VENTOLIN HFA) 108 (90 Base) MCG/ACT inhaler Inhale 2 puffs into the lungs every 6 (six) hours as needed for wheezing or shortness of breath.   pregabalin (LYRICA) 100 MG capsule Take 100 mg by mouth 3 (three) times daily.   diclofenac Sodium (VOLTAREN) 1 % GEL Apply 2 g topically 4 (four) times daily as needed (For pain).   hydrOXYzine (ATARAX) 25 MG tablet Take 25 mg by mouth 3 (three) times daily as needed for anxiety.   ondansetron (ZOFRAN) 4 MG tablet Take 1 tablet (4 mg total) by mouth every 6 (six) hours.   VRAYLAR 3 MG capsule Take 3 mg by mouth daily.   amoxicillin-clavulanate (AUGMENTIN) 500-125 MG tablet Take 1 tablet by mouth 2 (two) times daily.   cyclobenzaprine (FLEXERIL) 10 MG tablet Take 10 mg by mouth at bedtime.   escitalopram (LEXAPRO) 10 MG tablet Take 10 mg by mouth daily.   fluticasone furoate-vilanterol (BREO ELLIPTA) 200-25 MCG/ACT AEPB Inhale 2 puffs into the lungs  in the morning and at bedtime.   metoprolol succinate (TOPROL-XL) 25 MG 24 hr tablet Take 12.5 mg by mouth daily.   prazosin (MINIPRESS) 1 MG capsule Take 1 mg by mouth at bedtime.   pantoprazole (PROTONIX) 40 MG tablet Take 40 mg by mouth daily.   melatonin 3 MG TABS  tablet Take 3 mg by mouth at bedtime.    Medical Decision Making  Inpatient observation   Meds ordered this encounter  Medications   alum & mag hydroxide-simeth (MAALOX/MYLANTA) 200-200-20 MG/5ML suspension 30 mL   magnesium hydroxide (MILK OF MAGNESIA) suspension 30 mL   AND Linked Order Group    OLANZapine zydis (ZYPREXA) disintegrating tablet 10 mg    LORazepam (ATIVAN) tablet 1 mg   melatonin tablet 3 mg   hydrOXYzine (ATARAX) tablet 25 mg   cariprazine (VRAYLAR) capsule 3 mg   pregabalin (LYRICA) capsule 100 mg   prazosin (MINIPRESS) capsule 1 mg   prazosin (MINIPRESS) capsule 1 mg   pantoprazole (PROTONIX) EC tablet 40 mg   pantoprazole (PROTONIX) EC tablet 40 mg   ondansetron (ZOFRAN) tablet 4 mg   metoprolol succinate (TOPROL-XL) 24 hr tablet 12.5 mg   melatonin tablet 3 mg   hydrOXYzine (ATARAX) tablet 25 mg   fluticasone furoate-vilanterol (BREO ELLIPTA) 200-25 MCG/ACT 2 puff   cyclobenzaprine (FLEXERIL) tablet 10 mg   escitalopram (LEXAPRO) tablet 10 mg    Lab Orders         Resp panel by RT-PCR (RSV, Flu A&B, Covid) Anterior Nasal Swab         POC urine preg, ED         POCT Urine Drug Screen - (I-Screen)         POC SARS Coronavirus 2 Ag      Recommendations  Based on my evaluation the patient appears to have an emergency medical condition for which I recommend the patient be transferred to the emergency department for further evaluation.  [name], NP 04/12/22  6:05 AM

## 2022-04-11 NOTE — ED Triage Notes (Addendum)
Pt presents to [facility] voluntarily, accompanied by [employer] with complaint of suicidal thoughts with plan to overdose on medication or use a knife to cut herself. Pt reports having nightmares and "manic episodes". Pt stated "well it's better to get help than to act on my thoughts". Pt also reports hearing voices telling her to kill herself. Pt has significant history of psychiatric hospitalizations and suicide attempts. Pt currently denies HI, substance/alcohol use.

## 2022-04-11 NOTE — ED Notes (Addendum)
Pt A&O x 4, presents with suicidal ideations, plan to cut wrist or slit throat.  Pt unable to contact for safety.  Comfort measures given.  Monitoring for safety.

## 2022-04-12 DIAGNOSIS — R45851 Suicidal ideations: Secondary | ICD-10-CM | POA: Diagnosis not present

## 2022-04-12 DIAGNOSIS — Z765 Malingerer [conscious simulation]: Secondary | ICD-10-CM | POA: Diagnosis not present

## 2022-04-12 LAB — RESP PANEL BY RT-PCR (RSV, FLU A&B, COVID)  RVPGX2
Influenza A by PCR: NEGATIVE
Influenza B by PCR: NEGATIVE
Resp Syncytial Virus by PCR: NEGATIVE
SARS Coronavirus 2 by RT PCR: NEGATIVE

## 2022-04-12 MED ORDER — CYCLOBENZAPRINE HCL 10 MG PO TABS: 10.0000 mg | ORAL_TABLET | Freq: Every day | ORAL | Status: DC

## 2022-04-12 MED ORDER — FLUTICASONE FUROATE-VILANTEROL 200-25 MCG/ACT IN AEPB
2.0000 | INHALATION_SPRAY | Freq: Every day | RESPIRATORY_TRACT | Status: DC
  Filled 2022-04-12: qty 28

## 2022-04-12 MED ORDER — PANTOPRAZOLE SODIUM 40 MG PO TBEC: 40.0000 mg | DELAYED_RELEASE_TABLET | Freq: Every day | ORAL | Status: DC

## 2022-04-12 MED ORDER — HYDROXYZINE HCL 25 MG PO TABS: ORAL_TABLET | ORAL | 0 refills | Status: DC

## 2022-04-12 MED ORDER — METOPROLOL SUCCINATE ER 25 MG PO TB24: 12.5000 mg | ORAL_TABLET | Freq: Every day | ORAL | Status: DC

## 2022-04-12 MED ORDER — HYDROXYZINE HCL 25 MG PO TABS: 25.0000 mg | ORAL_TABLET | Freq: Three times a day (TID) | ORAL | Status: DC | PRN

## 2022-04-12 MED ORDER — PREGABALIN 50 MG PO CAPS: 100.0000 mg | ORAL_CAPSULE | Freq: Three times a day (TID) | ORAL | Status: DC

## 2022-04-12 MED ORDER — ESCITALOPRAM OXALATE 10 MG PO TABS: 10.0000 mg | ORAL_TABLET | Freq: Every day | ORAL | Status: DC

## 2022-04-12 MED ORDER — CARIPRAZINE HCL 3 MG PO CAPS: 3.0000 mg | ORAL_CAPSULE | Freq: Every day | ORAL | Status: DC

## 2022-04-12 MED ORDER — MELATONIN 3 MG PO TABS: 3.0000 mg | ORAL_TABLET | Freq: Every day | ORAL | Status: DC

## 2022-04-12 MED ORDER — PRAZOSIN HCL 1 MG PO CAPS: 1.0000 mg | ORAL_CAPSULE | Freq: Every day | ORAL | Status: DC

## 2022-04-12 MED ORDER — ONDANSETRON HCL 4 MG PO TABS
4.0000 mg | ORAL_TABLET | Freq: Four times a day (QID) | ORAL | Status: DC
  Administered 2022-04-12: 4 mg via ORAL
  Filled 2022-04-12: qty 1

## 2022-04-12 NOTE — ED Notes (Signed)
Pt is awake and alert. She is laying quietly in bed.  She was offered breakfast.  Awaiting provider for eval.  No distress noted.

## 2022-04-12 NOTE — Discharge Instructions (Addendum)
  Safety Plan [name] will reach out to mobile crisis by calling [phone] or return here to [name] call mobile crisis, or go to nearest emergency room if condition worsens or if suicidal thoughts become active Patients' will follow up with  outpatient psychiatric services (therapy/medication management).  The suicide prevention education provided includes the following: Suicide risk factors Suicide prevention and interventions National Suicide Hotline telephone number [facility] assessment telephone number [facility] Emergency Assistance [place] and/or Residential Mobile Crisis Unit telephone number

## 2022-04-12 NOTE — Discharge Summary (Signed)
[name] to be D/C'd Home per MD order. Discussed with the patient and all questions fully answered. An After Visit Summary was printed and given to the patient. All belongings were given to patient.  Patient escorted out and D/C home via private auto.  [name]  04/12/2022 8:43 AM
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
## 2022-04-12 NOTE — ED Notes (Addendum)
Pt was standing at the nurses station and tapping the glass. She was redirected and is currently sitting back in bed.  Awaiting provider for dispo.

## 2022-04-12 NOTE — ED Notes (Signed)
Pt again offered breakfast and refused. Pt stated " I just want to go home".   Will cont to monitor for safety

## 2022-04-12 NOTE — ED Notes (Signed)
Pt sleeping at present, no distress noted.  Monitoring for safety. 

## 2022-04-13 ENCOUNTER — Emergency Department (HOSPITAL_COMMUNITY)
Admission: EM | Admit: 2022-04-13 | Discharge: 2022-04-14 | Discharge status: Against Medical Advice | Payer: Medicare PPO | Attending: Emergency Medicine | Admitting: Emergency Medicine

## 2022-04-13 ENCOUNTER — Encounter (HOSPITAL_COMMUNITY): Payer: Self-pay | Admitting: Emergency Medicine

## 2022-04-13 ENCOUNTER — Other Ambulatory Visit: Payer: Self-pay

## 2022-04-13 ENCOUNTER — Emergency Department (HOSPITAL_COMMUNITY): Payer: Medicare PPO

## 2022-04-13 DIAGNOSIS — R0789 Other chest pain: Secondary | ICD-10-CM | POA: Insufficient documentation

## 2022-04-13 DIAGNOSIS — S60410A Abrasion of right index finger, initial encounter: Secondary | ICD-10-CM | POA: Insufficient documentation

## 2022-04-13 DIAGNOSIS — R45851 Suicidal ideations: Secondary | ICD-10-CM | POA: Diagnosis not present

## 2022-04-13 DIAGNOSIS — I959 Hypotension, unspecified: Secondary | ICD-10-CM | POA: Diagnosis not present

## 2022-04-13 DIAGNOSIS — W25XXXA Contact with sharp glass, initial encounter: Secondary | ICD-10-CM | POA: Diagnosis not present

## 2022-04-13 DIAGNOSIS — Z765 Malingerer [conscious simulation]: Secondary | ICD-10-CM | POA: Diagnosis not present

## 2022-04-13 DIAGNOSIS — Z5321 Procedure and treatment not carried out due to patient leaving prior to being seen by health care provider: Secondary | ICD-10-CM | POA: Insufficient documentation

## 2022-04-13 LAB — CBC
HCT: 36.2 % (ref 36.0–46.0)
Hemoglobin: 11 g/dL — ABNORMAL LOW (ref 12.0–15.0)
MCH: 25.2 pg — ABNORMAL LOW (ref 26.0–34.0)
MCHC: 30.4 g/dL (ref 30.0–36.0)
MCV: 83 fL (ref 80.0–100.0)
Platelets: 247 10*3/uL (ref 150–400)
RBC: 4.36 MIL/uL (ref 3.87–5.11)
RDW: 14.6 % (ref 11.5–15.5)
WBC: 8.6 10*3/uL (ref 4.0–10.5)
nRBC: 0 % (ref 0.0–0.2)

## 2022-04-13 LAB — BASIC METABOLIC PANEL
Anion gap: 5 (ref 5–15)
BUN: 6 mg/dL (ref 6–20)
CO2: 26 mmol/L (ref 22–32)
Calcium: 8.7 mg/dL — ABNORMAL LOW (ref 8.9–10.3)
Chloride: 109 mmol/L (ref 98–111)
Creatinine, Ser: 0.75 mg/dL (ref 0.44–1.00)
GFR, Estimated: >60 mL/min (ref >60)
Glucose, Bld: 100 mg/dL — ABNORMAL HIGH (ref 70–99)
Potassium: 3.6 mmol/L (ref 3.5–5.1)
Sodium: 140 mmol/L (ref 135–145)

## 2022-04-13 LAB — I-STAT BETA HCG BLOOD, ED (MC, WL, AP ONLY): I-stat hCG, quantitative: <5 m[IU]/mL (ref <5)

## 2022-04-13 LAB — TROPONIN I (HIGH SENSITIVITY): Troponin I (High Sensitivity): <2 ng/L (ref <18)

## 2022-04-13 MED ORDER — LORAZEPAM 1 MG PO TABS
0.5000 mg | ORAL_TABLET | Freq: Once | ORAL | Status: AC
  Administered 2022-04-13: 0.5 mg via ORAL
  Filled 2022-04-13: qty 1

## 2022-04-13 NOTE — ED Provider Triage Note (Signed)
Emergency Medicine Provider Triage Evaluation Note  [name] , a 32 y.o. female  was evaluated in triage.  Pt complains of central chest pain which began 3 hours ago. Pain has been constant and feels like a pressure. Began while she was having a verbal argument; has had similar chest pain during episodes of stress. Tried her '50mg'$  Vistaril w/o relief. Does note lightheadedness w/o syncope as well as nausea. No fevers, leg swelling, hemoptysis. Denies ETOH and illicit drug use. Has been compliant with her daily meds.  Review of Systems  Positive: As above Negative: As above  Physical Exam  BP 101/71 (BP Location: Left Arm)   Pulse 88   Temp 98.2 F (36.8 C) (Oral)   Resp 18   Ht '5\' 5"'$  (1.651 m)   Wt 87.1 kg   LMP 03/09/2022   SpO2 95%   BMI 31.95 kg/m  Gen:   Awake, no distress   Resp:  Normal effort  MSK:   Moves extremities without difficulty  Other:  Heart RRR. No BLE edema.  Medical Decision Making  Medically screening exam initiated at 10:41 PM.  Appropriate orders placed.  [name] was informed that the remainder of the evaluation will be completed by another provider, this initial triage assessment does not replace that evaluation, and the importance of remaining in the ED until their evaluation is complete.  Undifferentiated chest pain - work up initiated.   Evelyn Breach, PA-C 04/13/22 [id number]

## 2022-04-13 NOTE — ED Triage Notes (Signed)
Per EMS pt from home, c/o centralized chest pain w/ no radiation and hypotension (for her).  Pt states she has some family issues which might be causing pain.  Negative for orthostatics .  Pt did take night meds.;  Pt was very adamant that she did not take too much medication.    96/53 20 G R AC 100 NS  324 ASA No nitro HR

## 2022-04-13 NOTE — ED Notes (Signed)
Pt stated she was feeling better and wanted to leave.

## 2022-04-14 ENCOUNTER — Other Ambulatory Visit: Payer: Self-pay

## 2022-04-14 ENCOUNTER — Emergency Department (HOSPITAL_COMMUNITY): Payer: Medicare PPO

## 2022-04-14 ENCOUNTER — Encounter (HOSPITAL_COMMUNITY): Payer: Self-pay

## 2022-04-14 ENCOUNTER — Emergency Department (EMERGENCY_DEPARTMENT_HOSPITAL)
Admission: EM | Admit: 2022-04-14 | Discharge: 2022-04-14 | Disposition: A | Discharge status: Home / Self Care | Payer: Medicare PPO | Source: Home / Self Care | Attending: Emergency Medicine | Admitting: Emergency Medicine

## 2022-04-14 DIAGNOSIS — R45851 Suicidal ideations: Secondary | ICD-10-CM

## 2022-04-14 DIAGNOSIS — R0789 Other chest pain: Secondary | ICD-10-CM | POA: Diagnosis not present

## 2022-04-14 DIAGNOSIS — W25XXXA Contact with sharp glass, initial encounter: Secondary | ICD-10-CM | POA: Insufficient documentation

## 2022-04-14 DIAGNOSIS — S60410A Abrasion of right index finger, initial encounter: Secondary | ICD-10-CM | POA: Insufficient documentation

## 2022-04-14 DIAGNOSIS — S60419A Abrasion of unspecified finger, initial encounter: Secondary | ICD-10-CM

## 2022-04-14 DIAGNOSIS — Z765 Malingerer [conscious simulation]: Secondary | ICD-10-CM

## 2022-04-14 LAB — TROPONIN I (HIGH SENSITIVITY): Troponin I (High Sensitivity): <2 ng/L (ref <18)
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
## 2022-04-14 NOTE — ED Notes (Signed)
TTS consult noted

## 2022-04-14 NOTE — Discharge Instructions (Signed)
You have an abrasion of your finger.  Please do good wound care, washing the area with soap and water several times a day.  Return with worsening pain, redness, swelling or other concerns.  Your x-ray was negative for fracture or foreign body that can be seen on x-ray.  Please follow-up with your psychiatry team as planned.

## 2022-04-14 NOTE — ED Triage Notes (Signed)
Pt arrived to triage complaining of right pointer finger laceration  Pt states that she cut her finger on the broken glass on her phone, pt is concerned that there is glass in her finger

## 2022-04-14 NOTE — Consult Note (Signed)
Telepsych Consultation   Reason for Consult:  TTS Consult for SI Referring Physician:   Location of Patient: [name] [id number] C Location of Provider: Other: [facility] Patient Identification: [name] MRN:  [id number] Principal Diagnosis: Passive suicidal ideations Diagnosis:  Principal Problem:   Passive suicidal ideations Active Problems:   Malingering  Total Time spent with patient: 25 minutes   [name], 32 y.o., female patient seen via video by this provider, consulted with Dr. Dwyane Dee; and chart reviewed on 04/14/22.     Subjective:   [name] is a 32 y.o. female with history of BPD, BP1 Disorder, chronic suicidal ideations, schizoaffective disorder,and malingering, patient presented to [facility] initially for evaluation of finger injury she sustained after breaking the glass cover of her mobile phone.  Subsequently remarked to EDP that she "did not feel safe going home" subsequently admitted to the ED TTS consult placed for psychiatric evaluation due to suicidality .  HPI:   [name] 32 y.o., female patient known to the behavioral health psychiatry team presented initially [name] for medical evaluation and subsequently reported to EDP prior to discharge that she did not feel safe returning home. [name] was discharged from [facility] at her request 04/12/2022 less than 10 hours after being admitted to the Continuous Assessment Unit  due to reported SI.  At discharge from [facility] reported that she was having nightmares and did not want to be at home along and that she was experiencing suicidal ideations and wanted to be discharged due to attending a concert.    On evaluation today [name] reports, that she broke her phone after her boyfriend is unable to pick her up and sustained an injury to her finger and presented to the ED for evaluation of her finger.  She reports subsequently becoming suicidal and depressed and  requested admission to the ED.  Reports that she has continued to have nightmares although the first night being discharged to [facility] and taking an increased dose of hydroxyzine which was she was able to sleep better and did not experience any night terrors.  [name] followed by an ACT team and is seen 3 times weekly however continues to have several ED or [facility] encounters ongoing over the last several months and routinely attributes his the same circumstances as the underlying reason for the suicidal ideation she is experiencing.  Patient is currently having custody issues involving her daughter and continues to ruminate on her husband's suicide a few years back. [name] was advised to contact her ACT team to schedule encounter on Monday if possible oppose to her visit which is scheduled for Tuesday. Patient is asking for taxi as she has a birthday party to attend at noon and she feels the bus will be unable to get her to the location on time.  Patient reports she is already purchased gifts for her daughter and urgently needs to obtain transportation assistance so that she can wait.  Advised that I will consult with social work in the emergency department to see if this can be arranged otherwise she will receive a bus pass. Patient denies any plans for self harm and endorses SI is passive and comes in and goes is triggered by interactions with mother in law pertaining to her daughter.  Patient is forward thinking although endorses passive intermittent suicidality is able to contract for safety and plans to follow-up with ACT team.  During evaluation [name]  is laying then sitting up in bed no acute distress.  She is alert, oriented x 4, calm, cooperative and attentive. Her mood is euthymic with congruent affect.  She has normal speech, and behavior.  Objectively there is no evidence of psychosis/mania or delusional thinking.  Patient is able to converse coherently, goal directed thoughts, no  distractibility, or pre-occupation.  She endorses chronic passive thoughts of suicide and remarks "coming to hospital to stay a little while makes me feel safe", [name] reports no specific plan denies any self-harm or thoughts of harming or killing others.  Patient answered question appropriately.   Patient does not meet criteria for inpatient treatment, patient chronically presents to the ED in crisis center with similar patterns of behavior and reports suspicion for malingering behavior, as patient had a problem with transportation that resulted in her coming into the emergency department and patient is  now requesting services to obtain a taxi~  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 fragile X syndrome    Chronic constipation    Depression    Drug-seeking behavior    Essential tremor    Headache    Overdose of acetaminophen 07/2017   and other meds   Personality disorder ([place])    Schizo-affective psychosis ([place])    Schizoaffective disorder, bipolar type ([place])    Seizures ([place])    Last seizure December 2017   Sleep apnea     Past Surgical History:  Procedure Laterality Date   MOUTH SURGERY  2009 or 2010   Family History:  Family History  Problem Relation Age of Onset   Mental illness Father    Asthma Father    PDD Brother    Seizures Brother     Social History:  Social History   Substance and Sexual Activity  Alcohol Use No   Alcohol/week: 1.0 standard drink of alcohol   Types: 1 Standard drinks or equivalent per week   Comment: denies at this time     Social History   Substance and Sexual Activity  Drug Use No   Comment: History of cocaine use at age 18 for 4 months    Social History   Socioeconomic History   Marital status: Widowed    Spouse name: Not on file   Number of children: 0   Years of education: Not on file   Highest education level:  Not on file  Occupational History   Occupation: disability  Tobacco Use   Smoking status: Former    Packs/day: 0.00    Types: Cigarettes   Smokeless tobacco: Never   Tobacco comments:    Smoked for 2  years age [age]  Vaping Use   Vaping Use: Never used  Substance and Sexual Activity   Alcohol use: No    Alcohol/week: 1.0 standard drink of alcohol    Types: 1 Standard drinks or equivalent per week    Comment: denies at this time   Drug use: No    Comment: History of cocaine use at age 45 for 4 months   Sexual activity: Not Currently    Birth control/protection: None  Other Topics Concern   Not on file  Social History Narrative   Marital status: Widowed      Children: daughter      Lives: with boyfriend, in two story  home      Employment:  Disability      Tobacco: quit smoking; smoked for two years.      Alcohol ;none      Drugs: none   Has not traveled outside of the country.   Right handed         Social Determinants of Health   Financial Resource Strain: Not on file  Food Insecurity: No Food Insecurity (01/31/2022)   Hunger Vital Sign    Worried About Running Out of Food in the Last Year: Never true    Ran Out of Food in the Last Year: Never true  Recent Concern: Food Insecurity - Food Insecurity Present (12/28/2021)   Hunger Vital Sign    Worried About Charity fundraiser in the Last Year: Often true    Ran Out of Food in the Last Year: Often true  Transportation Needs: Unmet Transportation Needs (01/31/2022)   PRAPARE - Hydrologist (Medical): No    Lack of Transportation (Non-Medical): Yes  Physical Activity: Not on file  Stress: Not on file  Social Connections: Not on file   Additional Social History:    Allergies:   Allergies  Allergen Reactions   Bee Venom Anaphylaxis   Coconut Flavor Anaphylaxis and Rash   Fish Allergy Anaphylaxis   Geodon [Ziprasidone Hcl] Other (See Comments)    Pt states that this medication  causes paralysis of the mouth.     Haloperidol And Related Other (See Comments)    Pt states that this medication causes paralysis of the mouth, jaw locks up   Lithobid [Lithium] Other (See Comments)    Seizure-like activity    Roxicodone [Oxycodone] Other (See Comments)    Hallucinations    Seroquel [Quetiapine] Other (See Comments)    Severe drowsiness Pt currently taking 12.5 mg BID, she is ok with this dose   Shellfish Allergy Anaphylaxis   Phenergan [Promethazine Hcl] Other (See Comments)    Chest pain     Prilosec [Omeprazole] Nausea And Vomiting    Pt can take protonix with no problems    Sulfa Antibiotics Other (See Comments)    Chest pain    Tegretol [Carbamazepine] Nausea And Vomiting   Prozac [Fluoxetine] Other (See Comments)    Increased Depression Suicidal thoughts   Tape Other (See Comments)    Skin tears, can only tolerate paper tape.   Tylenol [Acetaminophen] Other (See Comments)    Unknown reaction Pt states she can not take    Labs:  Results for orders placed or performed during the hospital encounter of 04/13/22 (from the past 48 hour(s))  Basic metabolic panel     Status: Abnormal   Collection Time: 04/13/22  8:50 PM  Result Value Ref Range   Sodium 140 135 - 145 mmol/L   Potassium 3.6 3.5 - 5.1 mmol/L   Chloride 109 98 - 111 mmol/L   CO2 26 22 - 32 mmol/L   Glucose, Bld 100 (H) 70 - 99 mg/dL    Comment: Glucose reference range applies only to samples taken after fasting for at least 8 hours.   BUN 6 6 - 20 mg/dL   Creatinine, Ser 0.75 0.44 - 1.00 mg/dL   Calcium 8.7 (L) 8.9 - 10.3 mg/dL   GFR, Estimated >60 >60 mL/min    Comment: (NOTE) Calculated using the CKD-EPI Creatinine Equation (2021)    Anion gap 5 5 - 15    Comment: Performed at [facility]  Lab, [address], [place], [place] [address]  CBC     Status: Abnormal   Collection Time: 04/13/22  8:50 PM  Result Value Ref Range   WBC 8.6 4.0 - 10.5 K/uL   RBC 4.36 3.87 - 5.11 MIL/uL    Hemoglobin 11.0 (L) 12.0 - 15.0 g/dL   HCT 36.2 36.0 - 46.0 %   MCV 83.0 80.0 - 100.0 fL   MCH 25.2 (L) 26.0 - 34.0 pg   MCHC 30.4 30.0 - 36.0 g/dL   RDW 14.6 11.5 - 15.5 %   Platelets 247 150 - 400 K/uL   nRBC 0.0 0.0 - 0.2 %    Comment: Performed at [facility], [place] [address], [place], [place] [address]  Troponin I (High Sensitivity)     Status: None   Collection Time: 04/13/22  8:50 PM  Result Value Ref Range   Troponin I (High Sensitivity) <2 <18 ng/L    Comment: (NOTE) Elevated high sensitivity troponin I (hsTnI) values and significant  changes across serial measurements may suggest ACS but many other  chronic and acute conditions are known to elevate hsTnI results.  Refer to the "Links" section for chest pain algorithms and additional  guidance. Performed at [facility], [place] [address], [place], [place] [address]   Troponin I (High Sensitivity)     Status: None   Collection Time: 04/13/22 10:54 PM  Result Value Ref Range   Troponin I (High Sensitivity) <2 <18 ng/L    Comment: (NOTE) Elevated high sensitivity troponin I (hsTnI) values and significant  changes across serial measurements may suggest ACS but many other  chronic and acute conditions are known to elevate hsTnI results.  Refer to the "Links" section for chest pain algorithms and additional  guidance. Performed at [facility], [place] [address], [place], [place] [address]   I-Stat beta hCG blood, ED     Status: None   Collection Time: 04/13/22 10:57 PM  Result Value Ref Range   I-stat hCG, quantitative <5.0 <5 mIU/mL   Comment 3            Comment:   GEST. AGE      CONC.  (mIU/mL)   <=1 WEEK        5 - 50     2 WEEKS       50 - 500     3 WEEKS       100 - 10,000     4 WEEKS     1,000 - 30,000        FEMALE AND NON-PREGNANT FEMALE:     LESS THAN 5 mIU/mL     Medications:  No current facility-administered medications for this encounter.   Current Outpatient Medications   Medication Sig Dispense Refill   albuterol (VENTOLIN HFA) 108 (90 Base) MCG/ACT inhaler Inhale 2 puffs into the lungs every 6 (six) hours as needed for wheezing or shortness of breath.     cyclobenzaprine (FLEXERIL) 10 MG tablet Take 10 mg by mouth at bedtime.     escitalopram (LEXAPRO) 10 MG tablet Take 1 tablet (10 mg total) by mouth daily for 14 days. 14 tablet 0   hydrOXYzine (ATARAX) 25 MG tablet Take 25 mg twice daily by mouth as needed for anxiety. Take 100 mg by mouth for sleep as needed at bedtime. 20 tablet 0   melatonin 3 MG TABS tablet Take 3 mg by mouth at bedtime.  metoprolol succinate (TOPROL-XL) 25 MG 24 hr tablet Take 0.5 tablets (12.5 mg total) by mouth daily for 14 days. 7 tablet 0   pantoprazole (PROTONIX) 40 MG tablet Take 1 tablet (40 mg total) by mouth daily for 14 days. 14 tablet 0   prazosin (MINIPRESS) 1 MG capsule Take 1 capsule (1 mg total) by mouth at bedtime for 14 days. 14 capsule 0   pregabalin (LYRICA) 100 MG capsule Take 100 mg by mouth 3 (three) times daily.     VRAYLAR 3 MG capsule Take 3 mg by mouth daily.      Musculoskeletal: Strength & Muscle Tone: within normal limits Gait & Station: normal Patient leans: N/A   Psychiatric Specialty Exam:  Presentation  General Appearance:  Appropriate for Environment  Eye Contact: Good  Speech: Clear and Coherent  Speech Volume: Normal  Handedness: Right   Mood and Affect  Mood: Euphoric  Affect: Congruent   Thought Process  Thought Processes: Coherent  Descriptions of Associations:Intact  Orientation:Full (Time, Place and Person)  Thought Content:WDL  History of Schizophrenia/Schizoaffective disorder:No  Duration of Psychotic Symptoms:N/A  Hallucinations:Hallucinations: None  Ideas of Reference:None  Suicidal Thoughts:Suicidal Thoughts: Yes, Passive SI Active Intent and/or Plan: Without Intent SI Passive Intent and/or Plan: Without Intent  Homicidal Thoughts:Homicidal  Thoughts: No   Sensorium  Memory: Immediate Good; Recent Good; Remote Good  Judgment: Poor  Insight: Poor   Executive Functions  Concentration: Fair  Attention Span: Good  Recall: Good  Fund of Knowledge: Good  Language: Good   Psychomotor Activity  Psychomotor Activity:  Assets  Assets: Communication Skills; Social Support; Resilience; Physical Health   Sleep  Sleep:Sleep: Fair    Physical Exam: Physical Exam Vitals reviewed.  Constitutional:      Appearance: Normal appearance. She is not toxic-appearing.  Neurological:     Mental Status: She is alert.    Review of Systems  Psychiatric/Behavioral:         Passive SI without intent or plan   Blood pressure (!) 107/59, pulse 75, temperature 97.7 F (36.5 C), resp. rate 20, height '5\' 5"'$  (1.651 m), weight 87 kg, last menstrual period 03/09/2022, SpO2 99 %. Body mass index is 31.92 kg/m.  Treatment Plan Summary: Plan : Follow-up with ACT to schedule visit for Monday.  Call [phone] or mobile crisis if your symptoms worsen.  Take medications as prescribed.  Patient does not meet inpatient psychiatric treatment criteria.  Patient appears to be malingering.  Disposition: No evidence of imminent risk to self or others at present.   Patient does not meet criteria for psychiatric inpatient admission. Supportive therapy provided about ongoing stressors. Discussed crisis plan, support from social network, calling 911, coming to the Emergency Department, and calling Suicide Hotline.  This service was provided via telemedicine using a 2-way, interactive audio and video technology.  Names of all persons participating in this telemedicine service and their role in this encounter. Name: [name] Role: Patient   Name: Carroll Sage. Kenton Kingfisher FNP-C , PMHNP-C  Role: Provider  Name: Dannial Monarch  Role: Registered Nurse     Molli Barrows, FNP-C, PMHNP-BC  [facility] [phone]   04/14/2022 11:00 AM

## 2022-04-18 ENCOUNTER — Other Ambulatory Visit: Payer: Self-pay

## 2022-04-18 ENCOUNTER — Emergency Department (HOSPITAL_COMMUNITY)
Admission: EM | Admit: 2022-04-18 | Discharge: 2022-04-19 | Disposition: A | Discharge status: Other Acute Inpatient Hospital | Payer: Medicare PPO | Attending: Emergency Medicine | Admitting: Emergency Medicine

## 2022-04-18 ENCOUNTER — Encounter (HOSPITAL_COMMUNITY): Payer: Self-pay

## 2022-04-18 DIAGNOSIS — X781XXA Intentional self-harm by knife, initial encounter: Secondary | ICD-10-CM | POA: Diagnosis not present

## 2022-04-18 DIAGNOSIS — R45851 Suicidal ideations: Secondary | ICD-10-CM

## 2022-04-18 DIAGNOSIS — Z1152 Encounter for screening for COVID-19: Secondary | ICD-10-CM | POA: Insufficient documentation

## 2022-04-18 DIAGNOSIS — F603 Borderline personality disorder: Secondary | ICD-10-CM | POA: Insufficient documentation

## 2022-04-18 DIAGNOSIS — S59912A Unspecified injury of left forearm, initial encounter: Secondary | ICD-10-CM | POA: Diagnosis present

## 2022-04-18 DIAGNOSIS — X789XXA Intentional self-harm by unspecified sharp object, initial encounter: Secondary | ICD-10-CM | POA: Diagnosis present

## 2022-04-18 DIAGNOSIS — S51812A Laceration without foreign body of left forearm, initial encounter: Secondary | ICD-10-CM | POA: Insufficient documentation

## 2022-04-18 DIAGNOSIS — J45909 Unspecified asthma, uncomplicated: Secondary | ICD-10-CM | POA: Insufficient documentation

## 2022-04-18 DIAGNOSIS — Z87891 Personal history of nicotine dependence: Secondary | ICD-10-CM | POA: Insufficient documentation

## 2022-04-18 DIAGNOSIS — F845 Asperger's syndrome: Secondary | ICD-10-CM | POA: Diagnosis not present

## 2022-04-18 DIAGNOSIS — F250 Schizoaffective disorder, bipolar type: Secondary | ICD-10-CM | POA: Insufficient documentation

## 2022-04-18 DIAGNOSIS — T1491XA Suicide attempt, initial encounter: Secondary | ICD-10-CM | POA: Diagnosis not present

## 2022-04-18 LAB — PREGNANCY, URINE: Preg Test, Ur: NEGATIVE

## 2022-04-18 LAB — CBC
HCT: 39.6 % (ref 36.0–46.0)
Hemoglobin: 12.2 g/dL (ref 12.0–15.0)
MCH: 25.5 pg — ABNORMAL LOW (ref 26.0–34.0)
MCHC: 30.8 g/dL (ref 30.0–36.0)
MCV: 82.8 fL (ref 80.0–100.0)
Platelets: 237 10*3/uL (ref 150–400)
RBC: 4.78 MIL/uL (ref 3.87–5.11)
RDW: 14.6 % (ref 11.5–15.5)
WBC: 9.1 10*3/uL (ref 4.0–10.5)
nRBC: 0 % (ref 0.0–0.2)

## 2022-04-18 LAB — COMPREHENSIVE METABOLIC PANEL
ALT: 15 U/L (ref 0–44)
AST: 19 U/L (ref 15–41)
Albumin: 4.2 g/dL (ref 3.5–5.0)
Alkaline Phosphatase: 61 U/L (ref 38–126)
Anion gap: 6 (ref 5–15)
BUN: 10 mg/dL (ref 6–20)
CO2: 20 mmol/L — ABNORMAL LOW (ref 22–32)
Calcium: 8.8 mg/dL — ABNORMAL LOW (ref 8.9–10.3)
Chloride: 112 mmol/L — ABNORMAL HIGH (ref 98–111)
Creatinine, Ser: 0.77 mg/dL (ref 0.44–1.00)
GFR, Estimated: >60 mL/min (ref >60)
Glucose, Bld: 95 mg/dL (ref 70–99)
Potassium: 3.8 mmol/L (ref 3.5–5.1)
Sodium: 138 mmol/L (ref 135–145)
Total Bilirubin: 0.5 mg/dL (ref 0.3–1.2)
Total Protein: 6.8 g/dL (ref 6.5–8.1)

## 2022-04-18 LAB — RAPID URINE DRUG SCREEN, HOSP PERFORMED
Amphetamines: NOT DETECTED
Barbiturates: NOT DETECTED
Benzodiazepines: NOT DETECTED
Cocaine: NOT DETECTED
Opiates: NOT DETECTED
Tetrahydrocannabinol: NOT DETECTED

## 2022-04-18 LAB — SALICYLATE LEVEL: Salicylate Lvl: <7 mg/dL — ABNORMAL LOW (ref 7.0–30.0)

## 2022-04-18 LAB — ETHANOL: Alcohol, Ethyl (B): <10 mg/dL (ref <10)

## 2022-04-18 LAB — ACETAMINOPHEN LEVEL: Acetaminophen (Tylenol), Serum: <10 ug/mL — ABNORMAL LOW (ref 10–30)

## 2022-04-18 NOTE — ED Provider Notes (Signed)
[facility] DEPT Provider Note: Georgena Spurling, MD, FACEP  CSN: [id number] MRN: [id number] ARRIVAL: 04/18/22 at [place]: [place]  Suicidal   HISTORY OF PRESENT ILLNESS  04/18/22 11:46 PM [name] is a 32 y.o. female with a history of psychiatric illness currently on Lexapro.  She states she became suicidal today although she denies any specific trigger.  She thinks her Lexapro is not working and she should be back on Zoloft.  She has multiple self-inflicted superficial lacerations to her left anterior forearm and left side of neck.  She denies being in any physical pain.    Past Medical History:  Diagnosis Date   Acid reflux    Anxiety    Asthma    last attack 03/13/15 or 03/14/15   Autism    Carrier of fragile X syndrome    Chronic constipation    Depression    Drug-seeking behavior    Essential tremor    Headache    Overdose of acetaminophen 07/2017   and other meds   Personality disorder ([place])    Schizo-affective psychosis ([place])    Schizoaffective disorder, bipolar type ([place])    Seizures ([place])    Last seizure December 2017   Sleep apnea     Past Surgical History:  Procedure Laterality Date   MOUTH SURGERY  2009 or 2010    Family History  Problem Relation Age of Onset   Mental illness Father    Asthma Father    PDD Brother    Seizures Brother     Social History   Tobacco Use   Smoking status: Former    Packs/day: 0    Types: Cigarettes   Smokeless tobacco: Never   Tobacco comments:    Smoked for 2  years age [age]  Vaping Use   Vaping Use: Never used  Substance Use Topics   Alcohol use: No    Alcohol/week: 1.0 standard drink of alcohol    Types: 1 Standard drinks or equivalent per week    Comment: denies at this time   Drug use: No    Comment: History of cocaine use at age 25 for 4 months    Prior to Admission medications   Medication Sig Start Date End Date Taking? Authorizing Provider  albuterol  (VENTOLIN HFA) 108 (90 Base) MCG/ACT inhaler Inhale 2 puffs into the lungs every 6 (six) hours as needed for wheezing or shortness of breath.    [provider]  cyclobenzaprine (FLEXERIL) 10 MG tablet Take 10 mg by mouth at bedtime. 03/20/22   [provider]  escitalopram (LEXAPRO) 10 MG tablet Take 1 tablet (10 mg total) by mouth daily for 14 days. 02/07/22 02/22/22  Massengill, Ovid Curd, MD  hydrOXYzine (ATARAX) 25 MG tablet Take 25 mg twice daily by mouth as needed for anxiety. Take 100 mg by mouth for sleep as needed at bedtime. 04/12/22   Scot Jun, NP  melatonin 3 MG TABS tablet Take 3 mg by mouth at bedtime.    [provider]  metoprolol succinate (TOPROL-XL) 25 MG 24 hr tablet Take 0.5 tablets (12.5 mg total) by mouth daily for 14 days. 02/06/22 02/22/22  Massengill, Ovid Curd, MD  pantoprazole (PROTONIX) 40 MG tablet Take 1 tablet (40 mg total) by mouth daily for 14 days. 02/07/22 02/22/22  Massengill, Ovid Curd, MD  prazosin (MINIPRESS) 1 MG capsule Take 1 capsule (1 mg total) by mouth at bedtime for 14 days. 02/06/22 02/22/22  Massengill, Ovid Curd,  MD  pregabalin (LYRICA) 100 MG capsule Take 100 mg by mouth 3 (three) times daily.    [provider]  VRAYLAR 3 MG capsule Take 3 mg by mouth daily. 03/03/22   [provider]    Allergies Bee venom, Coconut flavor, Fish allergy, Geodon [ziprasidone hcl], Haloperidol and related, Lithobid [lithium], Roxicodone [oxycodone], Seroquel [quetiapine], Shellfish allergy, Phenergan [promethazine hcl], Prilosec [omeprazole], Sulfa antibiotics, Tegretol [carbamazepine], Prozac [fluoxetine], Tape, and Tylenol [acetaminophen]   REVIEW OF SYSTEMS  Negative except as noted here or in the History of Present Illness.   PHYSICAL EXAMINATION  Initial Vital Signs Blood pressure 110/68, pulse (!) 110, temperature 98.5 F (36.9 C), temperature source Oral, resp. rate 16, last menstrual period 03/09/2022, SpO2 93  %.  Examination General: Well-developed, well-nourished female in no acute distress; appearance consistent with age of record HENT: normocephalic; atraumatic Eyes: Normal appearance Neck: supple; very superficial abrasions to left side of neck, none deep enough to cause bleeding Heart: regular rate and rhythm Lungs: clear to auscultation bilaterally Abdomen: soft; nondistended; nontender; bowel sounds present Extremities: No deformity; full range of motion; pulses normal Neurologic: Awake, alert and oriented; motor function intact in all extremities and symmetric; no facial droop Skin: Warm and dry; multiple superficial lacerations to left volar forearm, none full-thickness:    Psychiatric: +SI; depressed mood with congruent affect   RESULTS  Summary of this visit's results, reviewed and interpreted by myself:   EKG Interpretation  Date/Time:    Ventricular Rate:    PR Interval:    QRS Duration:   QT Interval:    QTC Calculation:   R Axis:     Text Interpretation:         Laboratory Studies: Results for orders placed or performed during the hospital encounter of 04/18/22 (from the past 24 hour(s))  Rapid urine drug screen (hospital performed)     Status: None   Collection Time: 04/18/22 10:57 PM  Result Value Ref Range   Opiates NONE DETECTED NONE DETECTED   Cocaine NONE DETECTED NONE DETECTED   Benzodiazepines NONE DETECTED NONE DETECTED   Amphetamines NONE DETECTED NONE DETECTED   Tetrahydrocannabinol NONE DETECTED NONE DETECTED   Barbiturates NONE DETECTED NONE DETECTED  Pregnancy, urine     Status: None   Collection Time: 04/18/22 10:57 PM  Result Value Ref Range   Preg Test, Ur NEGATIVE NEGATIVE  Comprehensive metabolic panel     Status: Abnormal   Collection Time: 04/18/22 11:03 PM  Result Value Ref Range   Sodium 138 135 - 145 mmol/L   Potassium 3.8 3.5 - 5.1 mmol/L   Chloride 112 (H) 98 - 111 mmol/L   CO2 20 (L) 22 - 32 mmol/L   Glucose, Bld 95 70 -  99 mg/dL   BUN 10 6 - 20 mg/dL   Creatinine, Ser 0.77 0.44 - 1.00 mg/dL   Calcium 8.8 (L) 8.9 - 10.3 mg/dL   Total Protein 6.8 6.5 - 8.1 g/dL   Albumin 4.2 3.5 - 5.0 g/dL   AST 19 15 - 41 U/L   ALT 15 0 - 44 U/L   Alkaline Phosphatase 61 38 - 126 U/L   Total Bilirubin 0.5 0.3 - 1.2 mg/dL   GFR, Estimated >60 >60 mL/min   Anion gap 6 5 - 15  Ethanol     Status: None   Collection Time: 04/18/22 11:03 PM  Result Value Ref Range   Alcohol, Ethyl (B) Q000111Q Q000111Q mg/dL  Salicylate level  Status: Abnormal   Collection Time: 04/18/22 11:03 PM  Result Value Ref Range   Salicylate Lvl Q000111Q (L) 7.0 - 30.0 mg/dL  Acetaminophen level     Status: Abnormal   Collection Time: 04/18/22 11:03 PM  Result Value Ref Range   Acetaminophen (Tylenol), Serum <10 (L) 10 - 30 ug/mL  cbc     Status: Abnormal   Collection Time: 04/18/22 11:03 PM  Result Value Ref Range   WBC 9.1 4.0 - 10.5 K/uL   RBC 4.78 3.87 - 5.11 MIL/uL   Hemoglobin 12.2 12.0 - 15.0 g/dL   HCT 39.6 36.0 - 46.0 %   MCV 82.8 80.0 - 100.0 fL   MCH 25.5 (L) 26.0 - 34.0 pg   MCHC 30.8 30.0 - 36.0 g/dL   RDW 14.6 11.5 - 15.5 %   Platelets 237 150 - 400 K/uL   nRBC 0.0 0.0 - 0.2 %  Resp panel by RT-PCR (RSV, Flu A&B, Covid) Anterior Nasal Swab     Status: None   Collection Time: 04/19/22 12:30 AM   Specimen: Anterior Nasal Swab  Result Value Ref Range   SARS Coronavirus 2 by RT PCR NEGATIVE NEGATIVE   Influenza A by PCR NEGATIVE NEGATIVE   Influenza B by PCR NEGATIVE NEGATIVE   Resp Syncytial Virus by PCR NEGATIVE NEGATIVE   Imaging Studies: No results found.  ED COURSE and MDM  Nursing notes, initial and subsequent vitals signs, including pulse oximetry, reviewed and interpreted by myself.  Vitals:   04/18/22 2248 04/18/22 2249  BP:  110/68  Pulse:  (!) 110  Resp:  16  Temp:  98.5 F (36.9 C)  TempSrc:  Oral  SpO2: 100% 93%   Medications  albuterol (VENTOLIN HFA) 108 (90 Base) MCG/ACT inhaler 2 puff (has no  administration in time range)  escitalopram (LEXAPRO) tablet 10 mg (has no administration in time range)  metoprolol succinate (TOPROL-XL) 24 hr tablet 12.5 mg (has no administration in time range)  pantoprazole (PROTONIX) EC tablet 40 mg (has no administration in time range)  prazosin (MINIPRESS) capsule 1 mg (1 mg Oral Not Given 04/19/22 0101)  pregabalin (LYRICA) capsule 100 mg (has no administration in time range)  cariprazine (VRAYLAR) capsule 3 mg (has no administration in time range)  melatonin tablet 3 mg (3 mg Oral Given 04/19/22 0058)  ibuprofen (ADVIL) tablet 400 mg (400 mg Oral Given 04/19/22 0058)    Patient is voluntary at this point.  I do not see a need to involuntarily commit the patient unless she attempts to elope. TTS consulted.  PROCEDURES  Procedures   ED DIAGNOSES     ICD-10-CM   1. Suicidal ideation  R45.851     2. Intentional self-harm by knife, initial encounter ([place])  X78.1XXA          Alayah Knouff, Jenny Reichmann, MD 04/19/22 0120

## 2022-04-18 NOTE — ED Triage Notes (Signed)
Arrives EMS from home after multiple superficial self inflicted lacerations by scissors to left anterior forearm and left side of neck.   Has had multiple stressors in the household related to custody of child.   Thinks these episodes may be caused by the Lexapro she is on and thinks she should restart her Zoloft.

## 2022-04-19 ENCOUNTER — Inpatient Hospital Stay (HOSPITAL_COMMUNITY)
Admission: AD | Admit: 2022-04-19 | Discharge: 2022-04-30 | DRG: 885 | Disposition: A | Discharge status: Home / Self Care | Payer: Medicare PPO | Source: Intra-hospital | Attending: Psychiatry | Admitting: Psychiatry

## 2022-04-19 ENCOUNTER — Encounter (HOSPITAL_COMMUNITY): Payer: Self-pay | Admitting: Psychiatry

## 2022-04-19 DIAGNOSIS — Z79899 Other long term (current) drug therapy: Secondary | ICD-10-CM

## 2022-04-19 DIAGNOSIS — K5900 Constipation, unspecified: Secondary | ICD-10-CM | POA: Diagnosis not present

## 2022-04-19 DIAGNOSIS — F845 Asperger's syndrome: Secondary | ICD-10-CM | POA: Diagnosis present

## 2022-04-19 DIAGNOSIS — Z825 Family history of asthma and other chronic lower respiratory diseases: Secondary | ICD-10-CM

## 2022-04-19 DIAGNOSIS — F250 Schizoaffective disorder, bipolar type: Secondary | ICD-10-CM | POA: Diagnosis present

## 2022-04-19 DIAGNOSIS — Z882 Allergy status to sulfonamides status: Secondary | ICD-10-CM

## 2022-04-19 DIAGNOSIS — F4310 Post-traumatic stress disorder, unspecified: Secondary | ICD-10-CM | POA: Diagnosis present

## 2022-04-19 DIAGNOSIS — Z87891 Personal history of nicotine dependence: Secondary | ICD-10-CM

## 2022-04-19 DIAGNOSIS — T1491XA Suicide attempt, initial encounter: Secondary | ICD-10-CM | POA: Diagnosis not present

## 2022-04-19 DIAGNOSIS — Z888 Allergy status to other drugs, medicaments and biological substances status: Secondary | ICD-10-CM

## 2022-04-19 DIAGNOSIS — F603 Borderline personality disorder: Secondary | ICD-10-CM | POA: Diagnosis present

## 2022-04-19 DIAGNOSIS — X789XXA Intentional self-harm by unspecified sharp object, initial encounter: Secondary | ICD-10-CM | POA: Diagnosis not present

## 2022-04-19 DIAGNOSIS — L539 Erythematous condition, unspecified: Secondary | ICD-10-CM | POA: Insufficient documentation

## 2022-04-19 DIAGNOSIS — F411 Generalized anxiety disorder: Secondary | ICD-10-CM | POA: Diagnosis present

## 2022-04-19 DIAGNOSIS — Z91018 Allergy to other foods: Secondary | ICD-10-CM

## 2022-04-19 DIAGNOSIS — R45851 Suicidal ideations: Secondary | ICD-10-CM | POA: Diagnosis present

## 2022-04-19 DIAGNOSIS — Z148 Genetic carrier of other disease: Secondary | ICD-10-CM | POA: Diagnosis not present

## 2022-04-19 DIAGNOSIS — J45909 Unspecified asthma, uncomplicated: Secondary | ICD-10-CM | POA: Diagnosis present

## 2022-04-19 DIAGNOSIS — Z886 Allergy status to analgesic agent status: Secondary | ICD-10-CM

## 2022-04-19 DIAGNOSIS — Z91030 Bee allergy status: Secondary | ICD-10-CM | POA: Diagnosis not present

## 2022-04-19 DIAGNOSIS — Z91013 Allergy to seafood: Secondary | ICD-10-CM | POA: Diagnosis not present

## 2022-04-19 DIAGNOSIS — F319 Bipolar disorder, unspecified: Secondary | ICD-10-CM | POA: Diagnosis present

## 2022-04-19 DIAGNOSIS — Z818 Family history of other mental and behavioral disorders: Secondary | ICD-10-CM

## 2022-04-19 DIAGNOSIS — K219 Gastro-esophageal reflux disease without esophagitis: Secondary | ICD-10-CM | POA: Diagnosis present

## 2022-04-19 DIAGNOSIS — Z560 Unemployment, unspecified: Secondary | ICD-10-CM

## 2022-04-19 DIAGNOSIS — M797 Fibromyalgia: Secondary | ICD-10-CM | POA: Diagnosis present

## 2022-04-19 DIAGNOSIS — S51812A Laceration without foreign body of left forearm, initial encounter: Secondary | ICD-10-CM | POA: Diagnosis not present

## 2022-04-19 LAB — RESP PANEL BY RT-PCR (RSV, FLU A&B, COVID)  RVPGX2
Influenza A by PCR: NEGATIVE
Influenza B by PCR: NEGATIVE
Resp Syncytial Virus by PCR: NEGATIVE
SARS Coronavirus 2 by RT PCR: NEGATIVE

## 2022-04-19 MED ORDER — METOPROLOL SUCCINATE 12.5 MG HALF TABLET
12.5000 mg | ORAL_TABLET | Freq: Every day | ORAL | Status: DC
  Administered 2022-04-20 – 2022-04-28 (×9): 12.5 mg via ORAL
  Filled 2022-04-19 (×11): qty 1

## 2022-04-19 MED ORDER — PREGABALIN 100 MG PO CAPS
100.0000 mg | ORAL_CAPSULE | Freq: Three times a day (TID) | ORAL | Status: DC
  Administered 2022-04-19 – 2022-04-30 (×29): 100 mg via ORAL
  Filled 2022-04-19 (×30): qty 1

## 2022-04-19 MED ORDER — DIPHENHYDRAMINE HCL 25 MG PO CAPS: 50.0000 mg | ORAL_CAPSULE | Freq: Three times a day (TID) | ORAL | Status: DC | PRN

## 2022-04-19 MED ORDER — MAGNESIUM HYDROXIDE 400 MG/5ML PO SUSP: 30.0000 mL | Freq: Every day | ORAL | Status: DC | PRN

## 2022-04-19 MED ORDER — PREGABALIN 50 MG PO CAPS
100.0000 mg | ORAL_CAPSULE | Freq: Three times a day (TID) | ORAL | Status: DC
  Administered 2022-04-19: 100 mg via ORAL
  Filled 2022-04-19: qty 2

## 2022-04-19 MED ORDER — ACETAMINOPHEN 325 MG PO TABS: 650.0000 mg | ORAL_TABLET | Freq: Four times a day (QID) | ORAL | Status: DC | PRN

## 2022-04-19 MED ORDER — CARIPRAZINE HCL 1.5 MG PO CAPS
3.0000 mg | ORAL_CAPSULE | Freq: Every day | ORAL | Status: DC
  Administered 2022-04-19: 3 mg via ORAL
  Filled 2022-04-19: qty 2

## 2022-04-19 MED ORDER — METOPROLOL SUCCINATE ER 25 MG PO TB24
12.5000 mg | ORAL_TABLET | Freq: Every day | ORAL | Status: DC
  Administered 2022-04-19: 12.5 mg via ORAL
  Filled 2022-04-19: qty 0.5

## 2022-04-19 MED ORDER — LORAZEPAM 1 MG PO TABS: 2.0000 mg | ORAL_TABLET | Freq: Three times a day (TID) | ORAL | Status: DC | PRN

## 2022-04-19 MED ORDER — PANTOPRAZOLE SODIUM 40 MG PO TBEC
40.0000 mg | DELAYED_RELEASE_TABLET | Freq: Every day | ORAL | Status: DC
  Administered 2022-04-20 – 2022-04-30 (×10): 40 mg via ORAL
  Filled 2022-04-19 (×12): qty 1

## 2022-04-19 MED ORDER — HYDROXYZINE HCL 25 MG PO TABS
25.0000 mg | ORAL_TABLET | Freq: Three times a day (TID) | ORAL | Status: DC | PRN
  Administered 2022-04-20 – 2022-04-29 (×15): 25 mg via ORAL
  Filled 2022-04-19 (×15): qty 1

## 2022-04-19 MED ORDER — TRAZODONE HCL 50 MG PO TABS
50.0000 mg | ORAL_TABLET | Freq: Every evening | ORAL | Status: DC | PRN
  Administered 2022-04-24 – 2022-04-29 (×6): 50 mg via ORAL
  Filled 2022-04-19 (×7): qty 1

## 2022-04-19 MED ORDER — PRAZOSIN HCL 1 MG PO CAPS: 1.0000 mg | ORAL_CAPSULE | Freq: Every day | ORAL | Status: DC

## 2022-04-19 MED ORDER — ALBUTEROL SULFATE HFA 108 (90 BASE) MCG/ACT IN AERS: 2.0000 | INHALATION_SPRAY | Freq: Four times a day (QID) | RESPIRATORY_TRACT | Status: DC | PRN

## 2022-04-19 MED ORDER — ESCITALOPRAM OXALATE 10 MG PO TABS
10.0000 mg | ORAL_TABLET | Freq: Every day | ORAL | Status: DC
  Administered 2022-04-19: 10 mg via ORAL
  Filled 2022-04-19: qty 1

## 2022-04-19 MED ORDER — ESCITALOPRAM OXALATE 10 MG PO TABS
10.0000 mg | ORAL_TABLET | Freq: Every day | ORAL | Status: DC
  Administered 2022-04-20 – 2022-04-30 (×10): 10 mg via ORAL
  Filled 2022-04-19 (×12): qty 1

## 2022-04-19 MED ORDER — ALBUTEROL SULFATE HFA 108 (90 BASE) MCG/ACT IN AERS
2.0000 | INHALATION_SPRAY | Freq: Four times a day (QID) | RESPIRATORY_TRACT | Status: DC | PRN
  Administered 2022-04-21: 2 via RESPIRATORY_TRACT
  Filled 2022-04-19: qty 6.7

## 2022-04-19 MED ORDER — DIPHENHYDRAMINE HCL 50 MG/ML IJ SOLN: 50.0000 mg | Freq: Three times a day (TID) | INTRAMUSCULAR | Status: DC | PRN

## 2022-04-19 MED ORDER — PANTOPRAZOLE SODIUM 40 MG PO TBEC
40.0000 mg | DELAYED_RELEASE_TABLET | Freq: Every day | ORAL | Status: DC
  Administered 2022-04-19: 40 mg via ORAL
  Filled 2022-04-19: qty 1

## 2022-04-19 MED ORDER — CARIPRAZINE HCL 3 MG PO CAPS
3.0000 mg | ORAL_CAPSULE | Freq: Every day | ORAL | Status: DC
  Administered 2022-04-20 – 2022-04-27 (×7): 3 mg via ORAL
  Filled 2022-04-19 (×10): qty 1

## 2022-04-19 MED ORDER — PRAZOSIN HCL 1 MG PO CAPS
1.0000 mg | ORAL_CAPSULE | Freq: Every day | ORAL | Status: DC
  Administered 2022-04-20 – 2022-04-29 (×8): 1 mg via ORAL
  Filled 2022-04-19 (×13): qty 1

## 2022-04-19 MED ORDER — MELATONIN 3 MG PO TABS
3.0000 mg | ORAL_TABLET | Freq: Every day | ORAL | Status: DC
  Administered 2022-04-19 – 2022-04-29 (×9): 3 mg via ORAL
  Filled 2022-04-19 (×13): qty 1

## 2022-04-19 MED ORDER — MOMETASONE FURO-FORMOTEROL FUM 200-5 MCG/ACT IN AERO
2.0000 | INHALATION_SPRAY | Freq: Two times a day (BID) | RESPIRATORY_TRACT | Status: DC
  Administered 2022-04-19 – 2022-04-30 (×17): 2 via RESPIRATORY_TRACT
  Filled 2022-04-19 (×2): qty 8.8

## 2022-04-19 MED ORDER — IBUPROFEN 200 MG PO TABS
400.0000 mg | ORAL_TABLET | Freq: Once | ORAL | Status: AC
  Administered 2022-04-19: 400 mg via ORAL
  Filled 2022-04-19: qty 2

## 2022-04-19 MED ORDER — MELATONIN 3 MG PO TABS
3.0000 mg | ORAL_TABLET | Freq: Every day | ORAL | Status: DC
  Administered 2022-04-19: 3 mg via ORAL
  Filled 2022-04-19: qty 1

## 2022-04-19 MED ORDER — ALUM & MAG HYDROXIDE-SIMETH 200-200-20 MG/5ML PO SUSP
30.0000 mL | ORAL | Status: DC | PRN
  Administered 2022-04-27 – 2022-04-29 (×2): 30 mL via ORAL
  Filled 2022-04-19 (×2): qty 30

## 2022-04-19 MED ORDER — LORAZEPAM 2 MG/ML IJ SOLN: 2.0000 mg | Freq: Three times a day (TID) | INTRAMUSCULAR | Status: DC | PRN

## 2022-04-19 NOTE — Consult Note (Signed)
[facility] ED ASSESSMENT   Reason for Consult:  Psych Consult  Referring Physician:  Dr. Florina Ou Patient Identification: [name] MRN:  [id number] ED Chief Complaint: Suicide attempt by cutting of wrist [facility])  Diagnosis:  Principal Problem:   Suicide attempt by cutting of wrist [facility]) Active Problems:   Borderline personality disorder [facility])   Asperger syndrome   Schizoaffective disorder, bipolar type ([place])   ED Assessment Time Calculation: No data recorded   HPI: Per triage note: arrives EMS from home after multiple superficial self-inflicted lacerations by scissors to left anterior forearm and left side of neck.  Has had multiple stressors in the household related to custody of child.  Thinks these episodes may be caused by the Lexapro she is on and thinks she should restart Zoloft.   Subjective:  [name], 32 y.o., female patient seen face to face by this provider, consulted with Dr. Dwyane Dee; and chart reviewed on 04/19/22.  On evaluation [name] reports that she saw her 26-year-old daughter last Saturday who is in the custody of her deceased husband's mom, states that is since her spiraling down into her depression, because she is trying to get custody of her daughter and the courts are making her pay $3000 more and does not guarantee that she may get her daughter back.  Patient states that she has been trying to get her daughter back, she feels she would be better mentally if she had her daughter "I would not be feeling so depressed ", also states that she has a community support team and is supposed to have 3 visits a week and she only got 1 visit this week and it was not with her regular person which led her into a deeper depression.  Patient states that she thinks all this is because she started Lexapro which she does not feel like it works and wants to restart Zoloft.  Patient has superficial self-inflicted lacerations to her left wrist which is covered with  a bandage and to the left side of her neck.  Patient denies HI/AVH, continues to endorse SI with a plan to cut herself.  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  Patient states she has her own place and has a roommate.  Patient states her appetite is poor due to anxiety, and she sleeps too much due to feeling depressed.  Patient states the last time she has been to an inpatient psychiatric facility was in January 2024 and the medications she is taking Vraylar, Lyrica, prazosin, and Lexapro.  Patient says that her diagnosis are bipolar, anxiety and PTSD.  Patient denies using any alcohol or drugs, UDS negative and BAL less than 10.  During evaluation [name] is sitting on bed in no acute distress. She is alert, oriented x 4, calm, cooperative and attentive. Her mood is sad with congruent/flat affect.  She has normal speech, and behavior.  Objectively there is no evidence of psychosis/mania or delusional thinking.  Patient is able to converse coherently, goal directed thoughts, no distractibility, or pre-occupation. She denies homicidal ideation, psychosis, and paranoia.  Patient  continues to endorse SI with a plan to cut herself.  Patient states that, if she had her daughter she would feel happier, and if her regular community support  was available she would be happier, she has minimal insight on her issues.  Will recommend inpatient for inpatient  psychiatric admission for stabilization and treatment.   Past Psychiatric History: Bipolar, anxiety, and PTSD.  Multiple psychiatric inpatient admissions.   Risk to Self or Others: Is the patient at risk to self? Yes Has the patient been a risk to self in the past 6 months? Yes Has the patient been a risk to self within the distant past? No Is the patient a risk to others? No Has the patient been a risk to others in the past 6 months? No Has  the patient been a risk to others within the distant past? No  [place] Scale:  [place] [facility] from 04/18/2022 in [facility] Emergency Department at [facility] ED from 04/14/2022 in [facility] Emergency Department at [facility] ED from 04/13/2022 in [facility] Emergency Department at [facility] High Risk No Risk Error: Q3, 4, or 5 should not be populated when Q2 is No       AIMS:  , , ,  ,   ASAM:    Substance Abuse:     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 fragile X syndrome    Chronic constipation    Depression    Drug-seeking behavior    Essential tremor    Headache    Overdose of acetaminophen 07/2017   and other meds   Personality disorder ([place])    Schizo-affective psychosis ([place])    Schizoaffective disorder, bipolar type ([place])    Seizures ([place])    Last seizure December 2017   Sleep apnea     Past Surgical History:  Procedure Laterality Date   MOUTH SURGERY  2009 or 2010   Family History:  Family History  Problem Relation Age of Onset   Mental illness Father    Asthma Father    PDD Brother    Seizures Brother     Social History:  Social History   Substance and Sexual Activity  Alcohol Use No   Alcohol/week: 1.0 standard drink of alcohol   Types: 1 Standard drinks or equivalent per week   Comment: denies at this time     Social History   Substance and Sexual Activity  Drug Use No   Comment: History of cocaine use at age 35 for 4 months    Social History   Socioeconomic History   Marital status: Widowed    Spouse name: Not on file   Number of children: 0   Years of education: Not on file   Highest education level: Not on file  Occupational History   Occupation: disability  Tobacco Use   Smoking status: Former    Packs/day: 0    Types: Cigarettes   Smokeless tobacco: Never    Tobacco comments:    Smoked for 2  years age [age]  Vaping Use   Vaping Use: Never used  Substance and Sexual Activity   Alcohol use: No    Alcohol/week: 1.0 standard drink of alcohol    Types: 1 Standard drinks or equivalent per week    Comment: denies at this time   Drug use: No    Comment: History of cocaine use at age 7 for 4 months   Sexual activity: Not Currently    Birth control/protection: None  Other Topics Concern   Not on file  Social  History Narrative   Marital status: Widowed      Children: daughter      Lives: with boyfriend, in two story home      Employment:  Disability      Tobacco: quit smoking; smoked for two years.      Alcohol ;none      Drugs: none   Has not traveled outside of the country.   Right handed         Social Determinants of Health   Financial Resource Strain: Not on file  Food Insecurity: No Food Insecurity (01/31/2022)   Hunger Vital Sign    Worried About Running Out of Food in the Last Year: Never true    Ran Out of Food in the Last Year: Never true  Recent Concern: Food Insecurity - Food Insecurity Present (12/28/2021)   Hunger Vital Sign    Worried About Running Out of Food in the Last Year: Often true    Ran Out of Food in the Last Year: Often true  Transportation Needs: Unmet Transportation Needs (01/31/2022)   PRAPARE - Hydrologist (Medical): No    Lack of Transportation (Non-Medical): Yes  Physical Activity: Not on file  Stress: Not on file  Social Connections: Not on file      Allergies:   Allergies  Allergen Reactions   Bee Venom Anaphylaxis   Coconut Flavor Anaphylaxis and Rash   Fish Allergy Anaphylaxis   Geodon [Ziprasidone Hcl] Other (See Comments)    Pt states that this medication causes paralysis of the mouth.     Haloperidol And Related Other (See Comments)    Pt states that this medication causes paralysis of the mouth, jaw locks up   Lithobid [Lithium] Other (See Comments)     Seizure-like activity    Roxicodone [Oxycodone] Other (See Comments)    Hallucinations    Seroquel [Quetiapine] Other (See Comments)    Severe drowsiness Pt currently taking 12.5 mg BID, she is ok with this dose   Shellfish Allergy Anaphylaxis   Phenergan [Promethazine Hcl] Other (See Comments)    Chest pain     Prilosec [Omeprazole] Nausea And Vomiting    Pt can take protonix with no problems    Sulfa Antibiotics Other (See Comments)    Chest pain    Tegretol [Carbamazepine] Nausea And Vomiting   Prozac [Fluoxetine] Other (See Comments)    Increased Depression Suicidal thoughts   Tape Other (See Comments)    Skin tears, can only tolerate paper tape.   Tylenol [Acetaminophen] Other (See Comments)    Unknown reaction Pt states she can not take    Labs:  Results for orders placed or performed during the hospital encounter of 04/18/22 (from the past 48 hour(s))  Rapid urine drug screen (hospital performed)     Status: None   Collection Time: 04/18/22 10:57 PM  Result Value Ref Range   Opiates NONE DETECTED NONE DETECTED   Cocaine NONE DETECTED NONE DETECTED   Benzodiazepines NONE DETECTED NONE DETECTED   Amphetamines NONE DETECTED NONE DETECTED   Tetrahydrocannabinol NONE DETECTED NONE DETECTED   Barbiturates NONE DETECTED NONE DETECTED    Comment: (NOTE) DRUG SCREEN FOR MEDICAL PURPOSES ONLY.  IF CONFIRMATION IS NEEDED FOR ANY PURPOSE, NOTIFY LAB WITHIN 5 DAYS.  LOWEST DETECTABLE LIMITS FOR URINE DRUG SCREEN Drug Class                     Cutoff (ng/mL) Amphetamine and  metabolites    1000 Barbiturate and metabolites    200 Benzodiazepine                 200 Opiates and metabolites        300 Cocaine and metabolites        300 THC                            50 Performed at [facility], [place] [address] [place], [place] [address]   Pregnancy, urine     Status: None   Collection Time: 04/18/22 10:57 PM  Result Value Ref Range   Preg Test,  Ur NEGATIVE NEGATIVE    Comment:        THE SENSITIVITY OF THIS METHODOLOGY IS >20 mIU/mL. Performed at [facility], [place] [address], [place], [place] [address]   Comprehensive metabolic panel     Status: Abnormal   Collection Time: 04/18/22 11:03 PM  Result Value Ref Range   Sodium 138 135 - 145 mmol/L   Potassium 3.8 3.5 - 5.1 mmol/L   Chloride 112 (H) 98 - 111 mmol/L   CO2 20 (L) 22 - 32 mmol/L   Glucose, Bld 95 70 - 99 mg/dL    Comment: Glucose reference range applies only to samples taken after fasting for at least 8 hours.   BUN 10 6 - 20 mg/dL   Creatinine, Ser 0.77 0.44 - 1.00 mg/dL   Calcium 8.8 (L) 8.9 - 10.3 mg/dL   Total Protein 6.8 6.5 - 8.1 g/dL   Albumin 4.2 3.5 - 5.0 g/dL   AST 19 15 - 41 U/L   ALT 15 0 - 44 U/L   Alkaline Phosphatase 61 38 - 126 U/L   Total Bilirubin 0.5 0.3 - 1.2 mg/dL   GFR, Estimated >60 >60 mL/min    Comment: (NOTE) Calculated using the CKD-EPI Creatinine Equation (2021)    Anion gap 6 5 - 15    Comment: Performed at [facility], [place] [address] [place], [place] [address]  Ethanol     Status: None   Collection Time: 04/18/22 11:03 PM  Result Value Ref Range   Alcohol, Ethyl (B) <10 <10 mg/dL    Comment: (NOTE) Lowest detectable limit for serum alcohol is 10 mg/dL.  For medical purposes only. Performed at [facility], [place] [address], [place], [place] [id number]   Salicylate level     Status: Abnormal   Collection Time: 04/18/22 11:03 PM  Result Value Ref Range   Salicylate Lvl [id number] (L) 7.0 - 30.0 mg/dL    Comment: Performed at [facility], [place] [address], [place], [place] [address]  Acetaminophen level     Status: Abnormal   Collection Time: 04/18/22 11:03 PM  Result Value Ref Range   Acetaminophen (Tylenol), Serum <10 (L) 10 - 30 ug/mL    Comment: (NOTE) Therapeutic concentrations vary significantly. A range of 10-30 ug/mL  may be an effective  concentration for many patients. However, some  are best treated at concentrations outside of this range. Acetaminophen concentrations >150 ug/mL at 4 hours after ingestion  and >50 ug/mL at 12 hours after ingestion are often associated with  toxic reactions.  Performed at [facility], [place] [address], [place], [place] [address]   cbc     Status: Abnormal   Collection Time: 04/18/22 11:03 PM  Result Value Ref Range  WBC 9.1 4.0 - 10.5 K/uL   RBC 4.78 3.87 - 5.11 MIL/uL   Hemoglobin 12.2 12.0 - 15.0 g/dL   HCT 39.6 36.0 - 46.0 %   MCV 82.8 80.0 - 100.0 fL   MCH 25.5 (L) 26.0 - 34.0 pg   MCHC 30.8 30.0 - 36.0 g/dL   RDW 14.6 11.5 - 15.5 %   Platelets 237 150 - 400 K/uL   nRBC 0.0 0.0 - 0.2 %    Comment: Performed at [facility], [place] [address], [place], [place] [address]  Resp panel by RT-PCR (RSV, Flu A&B, Covid) Anterior Nasal Swab     Status: None   Collection Time: 04/19/22 12:30 AM   Specimen: Anterior Nasal Swab  Result Value Ref Range   SARS Coronavirus 2 by RT PCR NEGATIVE NEGATIVE    Comment: (NOTE) SARS-CoV-2 target nucleic acids are NOT DETECTED.  The SARS-CoV-2 RNA is generally detectable in upper respiratory specimens during the acute phase of infection. The lowest concentration of SARS-CoV-2 viral copies this assay can detect is 138 copies/mL. A negative result does not preclude SARS-Cov-2 infection and should not be used as the sole basis for treatment or other patient management decisions. A negative result may occur with  improper specimen collection/handling, submission of specimen other than nasopharyngeal swab, presence of viral mutation(s) within the areas targeted by this assay, and inadequate number of viral copies(<138 copies/mL). A negative result must be combined with clinical observations, patient history, and epidemiological information. The expected result is Negative.  Fact Sheet for Patients:   EntrepreneurPulse.com.au  Fact Sheet for Healthcare Providers:  IncredibleEmployment.be  This test is no t yet approved or cleared by the [place] FDA and  has been authorized for detection and/or diagnosis of SARS-CoV-2 by FDA under an Emergency Use Authorization (EUA). This EUA will remain  in effect (meaning this test can be used) for the duration of the COVID-19 declaration under Section 564(b)(1) of the Act, 21 U.S.C.section 360bbb-3(b)(1), unless the authorization is terminated  or revoked sooner.       Influenza A by PCR NEGATIVE NEGATIVE   Influenza B by PCR NEGATIVE NEGATIVE    Comment: (NOTE) The Xpert Xpress SARS-CoV-2/FLU/RSV plus assay is intended as an aid in the diagnosis of influenza from Nasopharyngeal swab specimens and should not be used as a sole basis for treatment. Nasal washings and aspirates are unacceptable for Xpert Xpress SARS-CoV-2/FLU/RSV testing.  Fact Sheet for Patients: EntrepreneurPulse.com.au  Fact Sheet for Healthcare Providers: IncredibleEmployment.be  This test is not yet approved or cleared by the [place] FDA and has been authorized for detection and/or diagnosis of SARS-CoV-2 by FDA under an Emergency Use Authorization (EUA). This EUA will remain in effect (meaning this test can be used) for the duration of the COVID-19 declaration under Section 564(b)(1) of the Act, 21 U.S.C. section 360bbb-3(b)(1), unless the authorization is terminated or revoked.     Resp Syncytial Virus by PCR NEGATIVE NEGATIVE    Comment: (NOTE) Fact Sheet for Patients: EntrepreneurPulse.com.au  Fact Sheet for Healthcare Providers: IncredibleEmployment.be  This test is not yet approved or cleared by the [place] FDA and has been authorized for detection and/or diagnosis of SARS-CoV-2 by FDA under an Emergency Use Authorization (EUA).  This EUA will remain in effect (meaning this test can be used) for the duration of the COVID-19 declaration under Section 564(b)(1) of the Act, 21 U.S.C. section 360bbb-3(b)(1), unless the authorization is terminated or revoked.  Performed at [facility]  [place] [address], [place],  [address]     Current Facility-Administered Medications  Medication Dose Route Frequency Provider Last Rate Last Admin   albuterol (VENTOLIN HFA) 108 (90 Base) MCG/ACT inhaler 2 puff  2 puff Inhalation Q6H PRN Molpus, John, MD       cariprazine (VRAYLAR) capsule 3 mg  3 mg Oral Daily Molpus, John, MD   3 mg at 04/19/22 1004   escitalopram (LEXAPRO) tablet 10 mg  10 mg Oral Daily Molpus, John, MD   10 mg at 04/19/22 1000   melatonin tablet 3 mg  3 mg Oral QHS Molpus, John, MD   3 mg at 04/19/22 0058   metoprolol succinate (TOPROL-XL) 24 hr tablet 12.5 mg  12.5 mg Oral Daily Molpus, John, MD   12.5 mg at 04/19/22 0959   pantoprazole (PROTONIX) EC tablet 40 mg  40 mg Oral Daily Molpus, John, MD   40 mg at 04/19/22 1000   prazosin (MINIPRESS) capsule 1 mg  1 mg Oral QHS Molpus, John, MD       pregabalin (LYRICA) capsule 100 mg  100 mg Oral TID Molpus, John, MD   100 mg at 04/19/22 1000   Current Outpatient Medications  Medication Sig Dispense Refill   albuterol (VENTOLIN HFA) 108 (90 Base) MCG/ACT inhaler Inhale 2 puffs into the lungs every 6 (six) hours as needed for wheezing or shortness of breath.     BREYNA 160-4.5 MCG/ACT inhaler Inhale into the lungs.     cyclobenzaprine (FLEXERIL) 10 MG tablet Take 10 mg by mouth at bedtime.     diclofenac Sodium (VOLTAREN) 1 % GEL Apply 2 g topically 4 (four) times daily.     escitalopram (LEXAPRO) 10 MG tablet Take 10 mg by mouth daily.     hydrOXYzine (ATARAX) 25 MG tablet Take 25 mg twice daily by mouth as needed for anxiety. Take 100 mg by mouth for sleep as needed at bedtime. 20 tablet 0   melatonin 3 MG TABS tablet Take 3 mg by mouth at  bedtime.     metoprolol succinate (TOPROL-XL) 25 MG 24 hr tablet Take 12.5 mg by mouth daily.     pantoprazole (PROTONIX) 40 MG tablet Take 40 mg by mouth daily.     prazosin (MINIPRESS) 1 MG capsule Take 1 mg by mouth at bedtime.     pregabalin (LYRICA) 100 MG capsule Take 100 mg by mouth 3 (three) times daily.     vitamin B-12 (CYANOCOBALAMIN) 100 MCG tablet Take 100 mcg by mouth daily.     VRAYLAR 3 MG capsule Take 3 mg by mouth daily.     escitalopram (LEXAPRO) 10 MG tablet Take 1 tablet (10 mg total) by mouth daily for 14 days. 14 tablet 0   metoprolol succinate (TOPROL-XL) 25 MG 24 hr tablet Take 0.5 tablets (12.5 mg total) by mouth daily for 14 days. 7 tablet 0   pantoprazole (PROTONIX) 40 MG tablet Take 1 tablet (40 mg total) by mouth daily for 14 days. 14 tablet 0   prazosin (MINIPRESS) 1 MG capsule Take 1 capsule (1 mg total) by mouth at bedtime for 14 days. 14 capsule 0    Musculoskeletal: Strength & Muscle Tone: within normal limits Gait & Station: normal Patient leans: N/A   Psychiatric Specialty Exam: Presentation  General Appearance:  Appropriate for Environment  Eye Contact: Good  Speech: Clear and Coherent  Speech Volume: Normal  Handedness: Right   Mood and Affect  Mood: Euphoric  Affect: Congruent   Thought Process  Thought Processes: Coherent  Descriptions of Associations:Intact  Orientation:Full (Time, Place and Person)  Thought Content:WDL  History of Schizophrenia/Schizoaffective disorder:No  Duration of Psychotic Symptoms:N/A  Hallucinations:No data recorded Ideas of Reference:None  Suicidal Thoughts:No data recorded Homicidal Thoughts:No data recorded  Sensorium  Memory: Immediate Good; Recent Good; Remote Good  Judgment: Poor  Insight: Poor   Executive Functions  Concentration: Fair  Attention Span: Good  Recall: Good  Fund of Knowledge: Good  Language: Good   Psychomotor Activity  Psychomotor  Activity:No data recorded  Assets  Assets: Communication Skills; Social Support; Resilience; Physical Health    Sleep  Sleep:No data recorded  Physical Exam: Physical Exam Vitals and nursing note reviewed. Exam conducted with a chaperone present.  HENT:     Nose: Nose normal.  Musculoskeletal:        General: Normal range of motion.  Skin:    General: Skin is warm.  Neurological:     Mental Status: She is alert.  Psychiatric:        Attention and Perception: Attention normal.        Mood and Affect: Mood is anxious. Affect is flat.        Speech: Speech normal.        Behavior: Behavior is cooperative.        Thought Content: Thought content includes suicidal ideation. Thought content includes suicidal plan.        Cognition and Memory: Memory normal.        Judgment: Judgment is impulsive and inappropriate.    Review of Systems  Constitutional: Negative.   HENT: Negative.    Cardiovascular: Negative.   Psychiatric/Behavioral:  Positive for depression and suicidal ideas.    Blood pressure (!) 102/54, pulse 100, temperature 97.8 F (36.6 C), temperature source Oral, resp. rate 18, last menstrual period 03/09/2022, SpO2 100 %. There is no height or weight on file to calculate BMI.   Medical Decision Making: Patient case review and discussed with Dr. Dwyane Dee. Patient needs inpatient psychiatric admission for stabilization and treatment.  Patient has been accepted to [facility] bed 301-1.    Disposition: Recommend psychiatric Inpatient admission when medically cleared.  [name], PMHNP 04/19/2022 2:10 PM

## 2022-04-19 NOTE — ED Notes (Signed)
Pt's wounds on left wrist were cleaned with saline and dressed using non-adherent gauze. Pt is requesting ibuprofen for pain relief. RN and EDP notified.

## 2022-04-19 NOTE — Progress Notes (Signed)
Pt was accepted to [facility] 04/19/2022. Bed assignment: [id number]  Pt meets inpatient criteria per Michaele Offer, NP  Attending Physician will be Janine Limbo, MD  Report can be called to: - Adult unit: [phone]  Pt can arrive after 2:30 PM  Care Team Notified: [facility] [place], RN, Wilmon Arms, RN, and [place], NP  [place], [place]  04/19/2022 1:42 PM

## 2022-04-19 NOTE — ED Notes (Signed)
Ambulatory to SAPU 36, belongings to [employer] [facility].

## 2022-04-19 NOTE — ED Notes (Signed)
Patient off unit to facility per provider.  Patient alert, cooperative,calm, no s/s of distress at this time.  Discharge information and belongings given to [facility] for transport. Patient ambulatory off unit, escorted off unit by RN. Patient transported by [employer].

## 2022-04-19 NOTE — Group Note (Signed)
Date:  04/19/2022 Time:  10:22 PM  Group Topic/Focus:  Wrap-Up Group:   The focus of this group is to help patients review their daily goal of treatment and discuss progress on daily workbooks.    Participation Level:  Active  Participation Quality:  Appropriate and Attentive  Affect:  Anxious and Blunted  Cognitive:  Alert and Appropriate  Insight: Appropriate and Good  Engagement in Group:  Engaged  Modes of Intervention:  Discussion and Education  Additional Comments:  Pt attended and participated in wrap up group this evening. In this group we discussed the patient's day, their goals and progress while in the facility.   Cristi Loron 04/19/2022, 10:22 PM

## 2022-04-19 NOTE — Progress Notes (Signed)
   04/19/22 2057  Psych Admission Type (Psych Patients Only)  Admission Status Voluntary  Psychosocial Assessment  Patient Complaints Depression  Eye Contact Brief  Facial Expression Anxious  Affect Anxious  Speech Rapid  Interaction Assertive  Motor Activity Fidgety  Appearance/Hygiene In scrubs  Behavior Characteristics Anxious  Mood Anxious;Depressed  Thought Process  Coherency WDL  Content WDL  Delusions WDL  Perception WDL  Hallucination None reported or observed  Judgment WDL  Confusion WDL  Danger to Self  Current suicidal ideation? Denies  Agreement Not to Harm Self Yes  Danger to Others  Danger to Others None reported or observed  Danger to Others Abnormal  Harmful Behavior to others No threats or harm toward other people   Alert/oriented. Makes needs/concerns known to staff. Pleasant cooperative with staff. Denies SI/HI/A/V hallucinations. Will encourage compliance and progression towards goals. Verbally contracted for safety. Will continue to monitor.

## 2022-04-19 NOTE — ED Notes (Addendum)
Voluntary at this time. Awaiting TTS. Dressed out in purple scrubs. Belongings present: 2 bags, 1 medium black duffel. Resting in stretcher, up to b/r, then up to nurses station to ask about water, then up to nurses station to ask about voltaren gel, rapid pressured speech, appears manic, polite cooperative

## 2022-04-19 NOTE — ED Notes (Signed)
Security called to wand pt.  

## 2022-04-19 NOTE — Plan of Care (Signed)
  Problem: Safety: Goal: Periods of time without injury will increase Outcome: Progressing   

## 2022-04-19 NOTE — Tx Team (Signed)
Initial Treatment Plan 04/19/2022 6:16 PM [name] [id number]    PATIENT STRESSORS: Finances Limited ability to see birth child Not able to find a job   PATIENT STRENGTHS: Communication skills    PATIENT IDENTIFIED PROBLEMS: Depression  Suicidal thoughts and attempt  Issues over custody with child                 DISCHARGE CRITERIA:  Improved stabilization in mood, thinking, and/or behavior Reduction of life-threatening or endangering symptoms to within safe limits  PRELIMINARY DISCHARGE PLAN: Return to previous living arrangement Return to CST   PATIENT/FAMILY INVOLVEMENT: This treatment plan has been presented to and reviewed with the patient, [name], The patient has been given the opportunity to ask questions and make suggestions.  Luna Glasgow, RN 04/19/2022, 6:16 PM

## 2022-04-19 NOTE — ED Notes (Signed)
Patient endorses suicidal ideation with no plan or intent at this time.

## 2022-04-19 NOTE — Progress Notes (Addendum)
Agustina well known to service line; been to [facility] many times.  Voluntarily admitted because of suicide attempt after seeing her daughter at supervised visit and pt was unable to take daughter home with pt.  Self-inflicted superficial scratches to left lateral neck and left posterior arm. Pt denies SI/HI/AVH on admission at [facility].  Pt denies using any illicit drugs.  Pt has a CST in place.    Pt verbalized understanding of plan of care and signed admission paperwork.

## 2022-04-20 ENCOUNTER — Encounter (HOSPITAL_COMMUNITY): Payer: Self-pay

## 2022-04-20 DIAGNOSIS — F319 Bipolar disorder, unspecified: Secondary | ICD-10-CM

## 2022-04-20 LAB — LIPID PANEL
Cholesterol: 191 mg/dL (ref 0–200)
HDL: 52 mg/dL (ref >40)
LDL Cholesterol: 106 mg/dL — ABNORMAL HIGH (ref 0–99)
Total CHOL/HDL Ratio: 3.7 RATIO
Triglycerides: 163 mg/dL — ABNORMAL HIGH (ref <150)
VLDL: 33 mg/dL (ref 0–40)

## 2022-04-20 LAB — TSH: TSH: 1.838 u[IU]/mL (ref 0.350–4.500)

## 2022-04-20 NOTE — BH IP Treatment Plan (Signed)
Interdisciplinary Treatment and Diagnostic Plan New  04/20/2022 Time of Session: [place] MRN: [id number]  Principal Diagnosis: Bipolar 1 disorder ([place])  Secondary Diagnoses: Principal Problem:   Bipolar 1 disorder ([place])   Current Medications:  Current Facility-Administered Medications  Medication Dose Route Frequency Provider Last Rate Last Admin   acetaminophen (TYLENOL) tablet 650 mg  650 mg Oral Q6H PRN Motley-Mangrum, Jadeka A, PMHNP       albuterol (VENTOLIN HFA) 108 (90 Base) MCG/ACT inhaler 2 puff  2 puff Inhalation Q6H PRN Motley-Mangrum, Jadeka A, PMHNP       alum & mag hydroxide-simeth (MAALOX/MYLANTA) 200-200-20 MG/5ML suspension 30 mL  30 mL Oral Q4H PRN Motley-Mangrum, Jadeka A, PMHNP       cariprazine (VRAYLAR) capsule 3 mg  3 mg Oral Daily Motley-Mangrum, Jadeka A, PMHNP   3 mg at 04/20/22 0744   diphenhydrAMINE (BENADRYL) capsule 50 mg  50 mg Oral TID PRN Motley-Mangrum, Jadeka A, PMHNP       Or   diphenhydrAMINE (BENADRYL) injection 50 mg  50 mg Intramuscular TID PRN Motley-Mangrum, Jadeka A, PMHNP       escitalopram (LEXAPRO) tablet 10 mg  10 mg Oral Daily Motley-Mangrum, Jadeka A, PMHNP   10 mg at 04/20/22 0744   hydrOXYzine (ATARAX) tablet 25 mg  25 mg Oral TID PRN Motley-Mangrum, Jadeka A, PMHNP   25 mg at 04/20/22 0744   LORazepam (ATIVAN) tablet 2 mg  2 mg Oral TID PRN Motley-Mangrum, Donneta Romberg A, PMHNP       Or   LORazepam (ATIVAN) injection 2 mg  2 mg Intramuscular TID PRN Motley-Mangrum, Jadeka A, PMHNP       magnesium hydroxide (MILK OF MAGNESIA) suspension 30 mL  30 mL Oral Daily PRN Motley-Mangrum, Jadeka A, PMHNP       melatonin tablet 3 mg  3 mg Oral QHS Motley-Mangrum, Jadeka A, PMHNP   3 mg at 04/19/22 2136   metoprolol succinate (TOPROL-XL) 24 hr tablet 12.5 mg  12.5 mg Oral Daily Motley-Mangrum, Jadeka A, PMHNP   12.5 mg at 04/20/22 0744   mometasone-formoterol (DULERA) 200-5 MCG/ACT inhaler 2 puff  2 puff Inhalation BID Lindell Spar I,  NP   2 puff at 04/20/22 0744   pantoprazole (PROTONIX) EC tablet 40 mg  40 mg Oral Daily Motley-Mangrum, Jadeka A, PMHNP   40 mg at 04/20/22 0744   prazosin (MINIPRESS) capsule 1 mg  1 mg Oral QHS Motley-Mangrum, Jadeka A, PMHNP       pregabalin (LYRICA) capsule 100 mg  100 mg Oral TID Motley-Mangrum, Jadeka A, PMHNP   100 mg at 04/20/22 1140   traZODone (DESYREL) tablet 50 mg  50 mg Oral QHS PRN Motley-Mangrum, Jadeka A, PMHNP       PTA Medications: Medications Prior to Admission  Medication Sig Dispense Refill Last Dose   albuterol (VENTOLIN HFA) 108 (90 Base) MCG/ACT inhaler Inhale 2 puffs into the lungs every 6 (six) hours as needed for wheezing or shortness of breath.      BREYNA 160-4.5 MCG/ACT inhaler Inhale into the lungs.      cyclobenzaprine (FLEXERIL) 10 MG tablet Take 10 mg by mouth at bedtime.      diclofenac Sodium (VOLTAREN) 1 % GEL Apply 2 g topically 4 (four) times daily.      escitalopram (LEXAPRO) 10 MG tablet Take 1 tablet (10 mg total) by mouth daily for 14 days. 14 tablet 0    escitalopram (LEXAPRO) 10 MG tablet Take 10 mg  by mouth daily.      hydrOXYzine (ATARAX) 25 MG tablet Take 25 mg twice daily by mouth as needed for anxiety. Take 100 mg by mouth for sleep as needed at bedtime. 20 tablet 0    melatonin 3 MG TABS tablet Take 3 mg by mouth at bedtime.      metoprolol succinate (TOPROL-XL) 25 MG 24 hr tablet Take 0.5 tablets (12.5 mg total) by mouth daily for 14 days. 7 tablet 0    metoprolol succinate (TOPROL-XL) 25 MG 24 hr tablet Take 12.5 mg by mouth daily.      pantoprazole (PROTONIX) 40 MG tablet Take 1 tablet (40 mg total) by mouth daily for 14 days. 14 tablet 0    pantoprazole (PROTONIX) 40 MG tablet Take 40 mg by mouth daily.      prazosin (MINIPRESS) 1 MG capsule Take 1 capsule (1 mg total) by mouth at bedtime for 14 days. 14 capsule 0    prazosin (MINIPRESS) 1 MG capsule Take 1 mg by mouth at bedtime.      pregabalin (LYRICA) 100 MG capsule Take 100 mg by  mouth 3 (three) times daily.      vitamin B-12 (CYANOCOBALAMIN) 100 MCG tablet Take 100 mcg by mouth daily.      VRAYLAR 3 MG capsule Take 3 mg by mouth daily.       Patient Stressors:  Psychosocial and Socio-economic  Patient Strengths: Communication skills   Treatment Modalities: Medication Management, Group therapy, Case management,  1 to 1 session with clinician, Psychoeducation, Recreational therapy.   Physician Treatment Plan for Primary Diagnosis: Bipolar 1 disorder ([place]) Long Term Goal(s):     Short Term Goals:    Medication Management: Evaluate patient's response, side effects, and tolerance of medication regimen.  Therapeutic Interventions: 1 to 1 sessions, Unit Group sessions and Medication administration.  Evaluation of Outcomes: Progressing  Physician Treatment Plan for Secondary Diagnosis: Principal Problem:   Bipolar 1 disorder ([place])  Long Term Goal(s):     Short Term Goals:       Medication Management: Evaluate patient's response, side effects, and tolerance of medication regimen.  Therapeutic Interventions: 1 to 1 sessions, Unit Group sessions and Medication administration.  Evaluation of Outcomes: Progressing   RN Treatment Plan for Primary Diagnosis: Bipolar 1 disorder ([place]) Long Term Goal(s): Knowledge of disease and therapeutic regimen to maintain health will improve  Short Term Goals: Ability to remain free from injury will improve, Ability to verbalize frustration and anger appropriately will improve, Ability to demonstrate self-control, Ability to participate in decision making will improve, Ability to verbalize feelings will improve, Ability to disclose and discuss suicidal ideas, Ability to identify and develop effective coping behaviors will improve, and Compliance with prescribed medications will improve  Medication Management: RN will administer medications as ordered by provider, will assess and evaluate patient's response and provide education  to patient for prescribed medication. RN will report any adverse and/or side effects to prescribing provider.  Therapeutic Interventions: 1 on 1 counseling sessions, Psychoeducation, Medication administration, Evaluate responses to treatment, Monitor vital signs and CBGs as ordered, Perform/monitor CIWA, COWS, AIMS and Fall Risk screenings as ordered, Perform wound care treatments as ordered.  Evaluation of Outcomes: Progressing   LCSW Treatment Plan for Primary Diagnosis: Bipolar 1 disorder ([place]) Long Term Goal(s): Safe transition to appropriate next level of care at discharge, Engage patient in therapeutic group addressing interpersonal concerns.  Short Term Goals: Engage patient in aftercare planning with referrals and resources, Increase  social support, Increase ability to appropriately verbalize feelings, Increase emotional regulation, Facilitate acceptance of mental health diagnosis and concerns, Facilitate patient progression through stages of change regarding substance use diagnoses and concerns, and Identify triggers associated with mental health/substance abuse issues  Therapeutic Interventions: Assess for all discharge needs, 1 to 1 time with Social worker, Explore available resources and support systems, Assess for adequacy in community support network, Educate family and significant other(s) on suicide prevention, Complete Psychosocial Assessment, Interpersonal group therapy.  Evaluation of Outcomes: Progressing   Progress in Treatment: Attending groups: Yes. Participating in groups: Yes. Taking medication as prescribed: Yes. Toleration medication: Yes. Family/Significant other contact made: No, will contact:  Does not consent Patient understands diagnosis: Yes. Discussing patient identified problems/goals with staff: Yes. Medical problems stabilized or resolved: Yes. Denies suicidal/homicidal ideation: Yes. Issues/concerns per patient self-inventory: Yes. Other:   New  problem(s) identified: No, Describe:  None Known  New Short Term/Long Term Goal(s):medication stabilization, elimination of SI thoughts, development of comprehensive mental wellness plan.   Patient Goals:  Improvement to symptoms/Coping Skills  Discharge Plan or Barriers: Patient recently admitted. CSW will continue to follow and assess for appropriate referrals and possible discharge planning  Reason for Continuation of Hospitalization: Anxiety Depression Mania Medication stabilization Suicidal ideation  Estimated Length of Stay: 3-7 Days  Last 3 [place] Suicide Severity Risk Score: [place] Admission (Current) from 04/19/2022 in [facility] ED from 04/18/2022 in [facility] Emergency Department at [facility] ED from 04/14/2022 in [facility] Emergency Department at [place] No Risk Error: Question 6 not populated No Risk       Last [facility] 2/9 Scores:    03/10/2022   10:01 PM 03/10/2022    9:56 PM 02/10/2022    1:49 AM  Depression screen PHQ 2/9  Decreased Interest 1 1 2   Down, Depressed, Hopeless 1 1 2   PHQ - 2 Score 2 2 4   Altered sleeping 1 1 2   Tired, decreased energy 1 1 2   Change in appetite 1 0 2  Feeling bad or failure about yourself  1 1 2   Trouble concentrating 1 1 2   Moving slowly or fidgety/restless 1 1 1   Suicidal thoughts 1 1 1   PHQ-9 Score 9 8 16   Difficult doing work/chores Somewhat difficult  Very difficult     medication stabilization, elimination of SI thoughts, development of comprehensive mental wellness plan.   Scribe for Treatment Team: Windle Guard, LCSW 04/20/2022 1:20 PM

## 2022-04-20 NOTE — Progress Notes (Signed)
   04/20/22 0846  Psych Admission Type (Psych Patients Only)  Admission Status Voluntary  Psychosocial Assessment  Patient Complaints Depression  Eye Contact Brief  Facial Expression Anxious  Affect Anxious  Speech Rapid  Interaction Assertive;Needy  Motor Activity Fidgety  Appearance/Hygiene In scrubs  Behavior Characteristics Anxious  Mood Anxious;Depressed  Thought Process  Coherency WDL  Content WDL  Delusions WDL  Perception WDL  Hallucination None reported or observed  Judgment WDL  Confusion WDL  Danger to Self  Current suicidal ideation? Denies  Agreement Not to Harm Self Yes  Description of Agreement verbal  Danger to Others  Danger to Others None reported or observed  Danger to Others Abnormal  Harmful Behavior to others No threats or harm toward other people

## 2022-04-20 NOTE — BHH Suicide Risk Assessment (Signed)
[facility] Admission Suicide Risk Assessment   Nursing information obtained from:  Patient Demographic factors:  Divorced or widowed, Caucasian, Low socioeconomic status, Unemployed Current Mental Status:  NA Loss Factors:  NA Historical Factors:  Prior suicide attempts, Impulsivity Risk Reduction Factors:  NA  Total Time spent with patient: 30 minutes  Principal Problem: Bipolar 1 disorder ([place]) Diagnosis:  Principal Problem:   Bipolar 1 disorder ([place])  Subjective Data:   CC: suicidal thoughts    [name] is a 32 year old female with a psychiatric history of Schizoaffective disorder bipolar type, Anxiety, Autism spectrum disorder, PTSD, Borderline personality disorder, who was admitted to this psychiatric unit for evaluation and treatment after suicide ideation and attempt via cutting self.   Current home psych meds: Protonix, Prazosin 1 mg qhs, Metoprolol 12.5 mg qd, Vraylar 3 mg qd, Lyrica 100 mg tid, Lexapro 10 mg qd.   On evaluation today, the patient reports worsening of mood and suicidal thoughts in the context of multiple psychosocial stressors including wanting to have custody of her daughter again.  She reports feeling overwhelmed with her daughter stated that she wanted to be reunited with the patient, but the patient realizes that this cannot happen at this time dose due to custody, and also her current financial situation.  She does report taking currently prescribed home psychiatric medications with 7 out of 7 days/week adherence.  Denies having other identifiable psychosocial stressors leading up to her cutting herself and having intense suicidal thoughts.  Overall she reports that her medications are working fairly well for her day-to-day living, depression, and abating any manic symptoms.  She does report anxiety continues to be a problem for her age and was current psychiatric medications.  Patient reports major present episode has been lasting for about 2 weeks at this  time, with depressed mood, pervasive sadness, some anhedonia, some changes in motivation and energy level.  She reports poor sleep with difficulty initiating and maintaining sleep.  Reports no change in appetite.  Concentration is off and on.  She reports suicidal thoughts come and go, but she is usually able to manage sleep, often to the stressors that led to this admission.  Denies any HI.  Reports anxiety is excessive, generalized, chronic, overwhelming impairing her ability to function, get out of the house interact with others.  Reports having panic attacks.  Denies having any manic symptoms at this time.  Does reports last manic episode was around Christmas 2023.  Denies any psychotic symptoms.    Past Psychiatric Hx: Previous Psych Diagnoses: Schizoaffective disorder bipolar type, Anxiety, Autism spectrum disorder, PTSD, Borderline personality disorder Prior inpatient treatment: multiple Current/prior outpatient treatment: [facility] Prior rehab hx: denies multiple Psychotherapy hx: [facility] History of suicide: 20-30 times by overdosing on pills, cutting or drinking 409 disinfectant History of homicide or aggression: denies Psychiatric medication history:  Previously trialed Tegretol, Haldol, Clozaril, Depakote, Invega, Lamictal, Seroquel, asenapine, abilify, vraylar, Lexapro, Zoloft  Neuromodulation history: denies   Substance Abuse Hx: Alcohol: denies Tobacco: denies Illicit drugs: denies Rx drug abuse: denies Rehab hx: denies   Past Medical History: Medical Diagnoses: Asthma, Seizure once in 2020, Fibromyalgia, Asperger's Syndrome, Acid Reflux  Home Rx: Protonix, Prazosin, Metoprolol, Vraylar, Lyrica, Lexapro  Prior Hosp: Patient has had 6 inpatient visits in the last 5 months Prior Surgeries/Trauma: No surgeries  Head trauma, LOC, concussions, seizures: seizure in 2020 Allergies: Geodon, Haloperidol, Lithium, Seroquel, Prozac  LMP: late February 2024 Contraception: Celibate for  one year PCP: Dr. Nancy Fetter at [facility]  Family History: Medical: denies  Psych: Father has PTSD, Mother and Brother has Autism, Brother has depression  Psych Rx: SA/HA: denies  Substance use family hx: denies   Social History: Childhood (bring, raised, lives now, parents, siblings, schooling, education): Patient was born and raised in [place], [place], patient has 2 brothers, patient has 3 associate degrees.  Abuse: Sexual, Physical, Emotional, Verbal  Marital Status: widow Sexual orientation: heterosexual  Children:1 daughter  Employment: unemployed Peer Group: CST Housing: Patient rents an apartment with a friend Finances: Patient receives disability  Legal: denies Nature conservation officer: denies  Continued Clinical Symptoms:  Alcohol Use Disorder Identification Test Final Score (AUDIT): 0 The "Alcohol Use Disorders Identification Test", Guidelines for Use in Primary Care, Second Edition.  World Pharmacologist [facility]). Score between 0-7:  no or low risk or alcohol related problems. Score between 8-15:  moderate risk of alcohol related problems. Score between 16-19:  high risk of alcohol related problems. Score 20 or above:  warrants further diagnostic evaluation for alcohol dependence and treatment.   CLINICAL FACTORS:   Severe Anxiety and/or Agitation Panic Attacks Bipolar Disorder:   Depressive phase Schizophrenia:   Depressive state Chronic Pain More than one psychiatric diagnosis Unstable or Poor Therapeutic Relationship Previous Psychiatric Diagnoses and Treatments Medical Diagnoses and Treatments/Surgeries    Psychiatric Specialty Exam:  Presentation  General Appearance:  Casual; Fairly Groomed  Eye Contact: Good  Speech: Normal Rate; Clear and Coherent  Speech Volume: Normal  Handedness: Right   Mood and Affect  Mood: Anxious; Depressed  Affect: Congruent; Full Range   Thought Process  Thought Processes: Linear  Descriptions of  Associations:Intact  Orientation:Full (Time, Place and Person)  Thought Content:Logical  History of Schizophrenia/Schizoaffective disorder:Yes  Duration of Psychotic Symptoms:N/A (none current)  Hallucinations:Hallucinations: None  Ideas of Reference:None  Suicidal Thoughts:Suicidal Thoughts: No  Homicidal Thoughts:Homicidal Thoughts: No   Sensorium  Memory: Immediate Good; Recent Good; Remote Good  Judgment: Fair  Insight: Fair   Community education officer  Concentration: Fair  Attention Span: Fair  Recall: Good  Fund of Knowledge: Good  Language: Good   Psychomotor Activity  Psychomotor Activity: Psychomotor Activity: Normal   Assets  Assets: Communication Skills; Social Support; Resilience; Physical Health   Sleep  Sleep: Sleep: Poor    Physical Exam: Physical Exam See H&P  ROS See H&P  Blood pressure 115/73, pulse 81, temperature 98.3 F (36.8 C), temperature source Oral, resp. rate 20, height 5\' 4"  (1.626 m), weight 90.7 kg, last menstrual period 03/09/2022, SpO2 99 %. Body mass index is 34.33 kg/m.   COGNITIVE FEATURES THAT CONTRIBUTE TO RISK:  None   H/o Asperger's   SUICIDE RISK:   Moderate:  Frequent suicidal ideation with limited intensity, and duration, some specificity in terms of plans, no associated intent, good self-control, limited dysphoria/symptomatology, some risk factors present, and identifiable protective factors, including available and accessible social support.  PLAN OF CARE: See H&P   I certify that inpatient services furnished can reasonably be expected to improve the patient's condition.   Christoper Allegra, MD 04/20/2022, 1:48 PM

## 2022-04-20 NOTE — H&P (Addendum)
Psychiatric Admission Assessment Adult  Patient Identification: [name] MRN:  [id number] Date of Evaluation:  04/20/2022 Chief Complaint:  Bipolar 1 disorder ([place]) [F31.9] Principal Diagnosis: Schizoaffective disorder, bipolar type ([place]) Diagnosis:  Principal Problem:   Schizoaffective disorder, bipolar type ([place]) Active Problems:   Borderline personality disorder ([place])   PTSD (post-traumatic stress disorder)   GAD (generalized anxiety disorder)  History of Present Illness:   CC: suicidal thoughts    [name] is a 32 year old female with a psychiatric history of Schizoaffective disorder bipolar type, Anxiety, Autism spectrum disorder, PTSD, Borderline personality disorder, who was admitted to this psychiatric unit for evaluation and treatment after suicide ideation and attempt via cutting self.   Current home psych meds: Protonix, Prazosin 1 mg qhs, Metoprolol 12.5 mg qd, Vraylar 3 mg qd, Lyrica 100 mg tid, Lexapro 10 mg qd.   On evaluation today, the patient reports worsening of mood and suicidal thoughts in the context of multiple psychosocial stressors including wanting to have custody of her daughter again.  She reports feeling overwhelmed with her daughter stated that she wanted to be reunited with the patient, but the patient realizes that this cannot happen at this time dose due to custody, and also her current financial situation.  She does report taking currently prescribed home psychiatric medications with 7 out of 7 days/week adherence.  Denies having other identifiable psychosocial stressors leading up to her cutting herself and having intense suicidal thoughts.  Overall she reports that her medications are working fairly well for her day-to-day living, depression, and abating any manic symptoms.  She does report anxiety continues to be a problem for her age and was current psychiatric medications.  Patient reports major present episode has been lasting for  about 2 weeks at this time, with depressed mood, pervasive sadness, some anhedonia, some changes in motivation and energy level.  She reports poor sleep with difficulty initiating and maintaining sleep.  Reports no change in appetite.  Concentration is off and on.  She reports suicidal thoughts come and go, but she is usually able to manage sleep, often to the stressors that led to this admission.  Denies any HI.  Reports anxiety is excessive, generalized, chronic, overwhelming impairing her ability to function, get out of the house interact with others.  Reports having panic attacks.  Denies having any manic symptoms at this time.  Does reports last manic episode was around Christmas 2023.  Denies any psychotic symptoms.    Past Psychiatric Hx: Previous Psych Diagnoses: Schizoaffective disorder bipolar type, Anxiety, Autism spectrum disorder, PTSD, Borderline personality disorder Prior inpatient treatment: multiple Current/prior outpatient treatment: [facility] Prior rehab hx: denies multiple Psychotherapy hx: [facility] History of suicide: 20-30 times by overdosing on pills, cutting or drinking 409 disinfectant History of homicide or aggression: denies Psychiatric medication history:  Previously trialed Tegretol, Haldol, Clozaril, Depakote, Invega, Lamictal, Seroquel, asenapine, abilify, vraylar, Lexapro, Zoloft  Neuromodulation history: denies   Substance Abuse Hx: Alcohol: denies Tobacco: denies Illicit drugs: denies Rx drug abuse: denies Rehab hx: denies   Past Medical History: Medical Diagnoses: Asthma, Seizure once in 2020, Fibromyalgia, Asperger's Syndrome, Acid Reflux  Home Rx: Protonix, Prazosin, Metoprolol, Vraylar, Lyrica, Lexapro  Prior Hosp: Patient has had 6 inpatient visits in the last 5 months Prior Surgeries/Trauma: No surgeries  Head trauma, LOC, concussions, seizures: seizure in 2020 Allergies: Geodon, Haloperidol, Lithium, Seroquel, Prozac  LMP: late February  2024 Contraception: Celibate for one year PCP: Dr. Nancy Fetter at [facility] History: Medical:  denies  Psych: Father has PTSD, Mother and Brother has Autism, Brother has depression  Psych Rx: SA/HA: denies  Substance use family hx: denies   Social History: Childhood (bring, raised, lives now, parents, siblings, schooling, education): Patient was born and raised in [place], [place], patient has 2 brothers, patient has 3 associate degrees.  Abuse: Sexual, Physical, Emotional, Verbal  Marital Status: widow Sexual orientation: heterosexual  Children:1 daughter  Employment: unemployed Peer Group: [facility]ST Housing: Patient rents an apartment with a friend Finances: Patient receives disability  Legal: denies Nature conservation officer: denies  Total Time spent with patient: 30 minutes   Is the patient at risk to self? Yes.    Has the patient been a risk to self in the past 6 months? Yes.    Has the patient been a risk to self within the distant past? Yes.    Is the patient a risk to others? No.  Has the patient been a risk to others in the past 6 months? No.  Has the patient been a risk to others within the distant past? No.   [place] Scale:  [place] Admission (Current) from 04/19/2022 in [facility] ED from 04/18/2022 in [facility] Emergency Department at [facility] ED from 04/14/2022 in [facility] Emergency Department at [place] No Risk Error: Question 6 not populated No Risk        Prior Inpatient Therapy: Yes.   If yes, describe multiple  Prior Outpatient Therapy: Yes.   If yes, describe CST team    Alcohol Screening: 1. How often do you have a drink containing alcohol?: Never 2. How many drinks containing alcohol do you have on a typical day when you are drinking?: 1 or 2 3. How often do you have six or more drinks on one occasion?: Never AUDIT-C Score: 0 4. How often during the last year have you found that you were  not able to stop drinking once you had started?: Never 5. How often during the last year have you failed to do what was normally expected from you because of drinking?: Never 6. How often during the last year have you needed a first drink in the morning to get yourself going after a heavy drinking session?: Never 7. How often during the last year have you had a feeling of guilt of remorse after drinking?: Never 8. How often during the last year have you been unable to remember what happened the night before because you had been drinking?: Never 9. Have you or someone else been injured as a result of your drinking?: No 10. Has a relative or friend or a doctor or another health worker been concerned about your drinking or suggested you cut down?: No Alcohol Use Disorder Identification Test Final Score (AUDIT): 0 Substance Abuse History in the last 12 months:  No. Consequences of Substance Abuse: NA Previous Psychotropic Medications: Yes  Psychological Evaluations: Yes  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 fragile X syndrome    Chronic constipation    Depression    Drug-seeking behavior    Essential tremor    Headache    Overdose of acetaminophen 07/2017   and other meds   Personality disorder ([place])    Schizo-affective psychosis ([place])  Schizoaffective disorder, bipolar type ([place])    Seizures ([place])    Last seizure December 2017   Sleep apnea     Past Surgical History:  Procedure Laterality Date   MOUTH SURGERY  2009 or 2010   Family History:  Family History  Problem Relation Age of Onset   Mental illness Father    Asthma Father    PDD Brother    Seizures Brother     Tobacco Screening:  Social History   Tobacco Use  Smoking Status Former   Packs/day: 0   Types: Cigarettes  Smokeless Tobacco Never  Tobacco Comments   Smoked for 2  years age [age]     [place] Tobacco Counseling     Are you interested in Tobacco Cessation Medications?  N/A, patient does not use tobacco products Counseled patient on smoking cessation:  N/A, patient does not use tobacco products Reason Tobacco Screening Not Completed: No value filed.       Social History:  Social History   Substance and Sexual Activity  Alcohol Use No   Alcohol/week: 1.0 standard drink of alcohol   Types: 1 Standard drinks or equivalent per week   Comment: denies at this time     Social History   Substance and Sexual Activity  Drug Use No   Comment: History of cocaine use at age 15 for 4 months    Additional Social History:                           Allergies:   Allergies  Allergen Reactions   Bee Venom Anaphylaxis   Coconut Flavor Anaphylaxis and Rash   Fish Allergy Anaphylaxis   Geodon [Ziprasidone Hcl] Other (See Comments)    Pt states that this medication causes paralysis of the mouth.     Haloperidol And Related Other (See Comments)    Pt states that this medication causes paralysis of the mouth, jaw locks up   Lithobid [Lithium] Other (See Comments)    Seizure-like activity    Roxicodone [Oxycodone] Other (See Comments)    Hallucinations    Seroquel [Quetiapine] Other (See Comments)    Severe drowsiness Pt currently taking 12.5 mg BID, she is ok with this dose   Shellfish Allergy Anaphylaxis   Phenergan [Promethazine Hcl] Other (See Comments)    Chest pain     Prilosec [Omeprazole] Nausea And Vomiting    Pt can take protonix with no problems    Sulfa Antibiotics Other (See Comments)    Chest pain    Tegretol [Carbamazepine] Nausea And Vomiting   Prozac [Fluoxetine] Other (See Comments)    Increased Depression Suicidal thoughts   Tape Other (See Comments)    Skin tears, can only tolerate paper tape.   Tylenol [Acetaminophen] Other (See Comments)    Unknown reaction Pt states she can not take   Lab Results:  Results for orders placed or  performed during the hospital encounter of 04/19/22 (from the past 48 hour(s))  TSH     Status: None   Collection Time: 04/20/22  6:21 AM  Result Value Ref Range   TSH 1.838 0.350 - 4.500 uIU/mL    Comment: Performed by a 3rd Generation assay with a functional sensitivity of <=0.01 uIU/mL. Performed at [facility], [place] [address] [place], [place] [address]   Lipid panel     Status: Abnormal   Collection Time: 04/20/22  6:21 AM  Result Value Ref Range  Cholesterol 191 0 - 200 mg/dL   Triglycerides 163 (H) <150 mg/dL   HDL 52 >40 mg/dL   Total CHOL/HDL Ratio 3.7 RATIO   VLDL 33 0 - 40 mg/dL   LDL Cholesterol 106 (H) 0 - 99 mg/dL    Comment:        Total Cholesterol/HDL:CHD Risk Coronary Heart Disease Risk Table                     Men   Women  1/2 Average Risk   3.4   3.3  Average Risk       5.0   4.4  2 X Average Risk   9.6   7.1  3 X Average Risk  23.4   11.0        Use the calculated Patient Ratio above and the CHD Risk Table to determine the patient's CHD Risk.        ATP III CLASSIFICATION (LDL):  <100     mg/dL   Optimal  100-129  mg/dL   Near or Above                    Optimal  130-159  mg/dL   Borderline  160-189  mg/dL   High  >190     mg/dL   Very High Performed at [facility] [address] [place], [place] [address]     Blood Alcohol level:  Lab Results  Component Value Date   ETH <10 04/18/2022   ETH <10 [id number]    Metabolic Disorder Labs:  Lab Results  Component Value Date   HGBA1C 5.0 10/07/2021   MPG 96.8 10/07/2021   MPG 96.8 09/02/2021   Lab Results  Component Value Date   PROLACTIN 66.2 (H) 03/02/2021   PROLACTIN 66.1 (H) 02/04/2020   Lab Results  Component Value Date   CHOL 191 04/20/2022   TRIG 163 (H) 04/20/2022   HDL 52 04/20/2022   CHOLHDL 3.7 04/20/2022   VLDL 33 04/20/2022   LDLCALC 106 (H) 04/20/2022   LDLCALC 102 (H) 12/10/2021    Current Medications: Current  Facility-Administered Medications  Medication Dose Route Frequency Provider Last Rate Last Admin   acetaminophen (TYLENOL) tablet 650 mg  650 mg Oral Q6H PRN Motley-Mangrum, Jadeka A, PMHNP       albuterol (VENTOLIN HFA) 108 (90 Base) MCG/ACT inhaler 2 puff  2 puff Inhalation Q6H PRN Motley-Mangrum, Jadeka A, PMHNP       alum & mag hydroxide-simeth (MAALOX/MYLANTA) 200-200-20 MG/5ML suspension 30 mL  30 mL Oral Q4H PRN Motley-Mangrum, Jadeka A, PMHNP       cariprazine (VRAYLAR) capsule 3 mg  3 mg Oral Daily Motley-Mangrum, Jadeka A, PMHNP   3 mg at 04/20/22 0744   diphenhydrAMINE (BENADRYL) capsule 50 mg  50 mg Oral TID PRN Motley-Mangrum, Jadeka A, PMHNP       Or   diphenhydrAMINE (BENADRYL) injection 50 mg  50 mg Intramuscular TID PRN Motley-Mangrum, Jadeka A, PMHNP       escitalopram (LEXAPRO) tablet 10 mg  10 mg Oral Daily Motley-Mangrum, Jadeka A, PMHNP   10 mg at 04/20/22 0744   hydrOXYzine (ATARAX) tablet 25 mg  25 mg Oral TID PRN Motley-Mangrum, Jadeka A, PMHNP   25 mg at 04/20/22 0744   LORazepam (ATIVAN) tablet 2 mg  2 mg Oral TID PRN Motley-Mangrum, Jadeka A, PMHNP       Or   LORazepam (ATIVAN) injection 2 mg  2 mg Intramuscular TID PRN Motley-Mangrum, Jadeka A, PMHNP       magnesium hydroxide (MILK OF MAGNESIA) suspension 30 mL  30 mL Oral Daily PRN Motley-Mangrum, Jadeka A, PMHNP       melatonin tablet 3 mg  3 mg Oral QHS Motley-Mangrum, Jadeka A, PMHNP   3 mg at 04/19/22 2136   metoprolol succinate (TOPROL-XL) 24 hr tablet 12.5 mg  12.5 mg Oral Daily Motley-Mangrum, Jadeka A, PMHNP   12.5 mg at 04/20/22 0744   mometasone-formoterol (DULERA) 200-5 MCG/ACT inhaler 2 puff  2 puff Inhalation BID Lindell Spar I, NP   2 puff at 04/20/22 0744   pantoprazole (PROTONIX) EC tablet 40 mg  40 mg Oral Daily Motley-Mangrum, Jadeka A, PMHNP   40 mg at 04/20/22 0744   prazosin (MINIPRESS) capsule 1 mg  1 mg Oral QHS Motley-Mangrum, Jadeka A, PMHNP       pregabalin (LYRICA) capsule 100 mg  100 mg  Oral TID Motley-Mangrum, Jadeka A, PMHNP   100 mg at 04/20/22 1140   traZODone (DESYREL) tablet 50 mg  50 mg Oral QHS PRN Motley-Mangrum, Jadeka A, PMHNP       PTA Medications: Medications Prior to Admission  Medication Sig Dispense Refill Last Dose   albuterol (VENTOLIN HFA) 108 (90 Base) MCG/ACT inhaler Inhale 2 puffs into the lungs every 6 (six) hours as needed for wheezing or shortness of breath.      BREYNA 160-4.5 MCG/ACT inhaler Inhale into the lungs.      cyclobenzaprine (FLEXERIL) 10 MG tablet Take 10 mg by mouth at bedtime.      diclofenac Sodium (VOLTAREN) 1 % GEL Apply 2 g topically 4 (four) times daily.      escitalopram (LEXAPRO) 10 MG tablet Take 1 tablet (10 mg total) by mouth daily for 14 days. 14 tablet 0    escitalopram (LEXAPRO) 10 MG tablet Take 10 mg by mouth daily.      hydrOXYzine (ATARAX) 25 MG tablet Take 25 mg twice daily by mouth as needed for anxiety. Take 100 mg by mouth for sleep as needed at bedtime. 20 tablet 0    melatonin 3 MG TABS tablet Take 3 mg by mouth at bedtime.      metoprolol succinate (TOPROL-XL) 25 MG 24 hr tablet Take 0.5 tablets (12.5 mg total) by mouth daily for 14 days. 7 tablet 0    metoprolol succinate (TOPROL-XL) 25 MG 24 hr tablet Take 12.5 mg by mouth daily.      pantoprazole (PROTONIX) 40 MG tablet Take 1 tablet (40 mg total) by mouth daily for 14 days. 14 tablet 0    pantoprazole (PROTONIX) 40 MG tablet Take 40 mg by mouth daily.      prazosin (MINIPRESS) 1 MG capsule Take 1 capsule (1 mg total) by mouth at bedtime for 14 days. 14 capsule 0    prazosin (MINIPRESS) 1 MG capsule Take 1 mg by mouth at bedtime.      pregabalin (LYRICA) 100 MG capsule Take 100 mg by mouth 3 (three) times daily.      vitamin B-12 (CYANOCOBALAMIN) 100 MCG tablet Take 100 mcg by mouth daily.      VRAYLAR 3 MG capsule Take 3 mg by mouth daily.       Musculoskeletal: Strength & Muscle Tone: within normal limits Gait & Station: normal Patient leans:  N/A            Psychiatric Specialty Exam:  Presentation  General Appearance:  Casual; Fairly  Groomed  Eye Contact: Good  Speech: Normal Rate; Clear and Coherent  Speech Volume: Normal  Handedness: Right   Mood and Affect  Mood: Anxious; Depressed  Affect: Congruent; Full Range   Thought Process  Thought Processes: Linear  Duration of Psychotic Symptoms: 2 weeks  Past Diagnosis of Schizophrenia or Psychoactive disorder: Yes  Descriptions of Associations:Intact  Orientation:Full (Time, Place and Person)  Thought Content:Logical  Hallucinations:Hallucinations: None  Ideas of Reference:None  Suicidal Thoughts:Suicidal Thoughts: No  Homicidal Thoughts:Homicidal Thoughts: No   Sensorium  Memory: Immediate Good; Recent Good; Remote Good  Judgment: Fair  Insight: Fair   Community education officer  Concentration: Fair  Attention Span: Fair  Recall: Good  Fund of Knowledge: Good  Language: Good   Psychomotor Activity  Psychomotor Activity: Psychomotor Activity: Normal   Assets  Assets: Communication Skills; Social Support; Resilience; Physical Health   Sleep  Sleep: Sleep: Poor    Physical Exam: Physical Exam Vitals reviewed.  Constitutional:      General: She is not in acute distress.    Appearance: She is not toxic-appearing.  Pulmonary:     Effort: Pulmonary effort is normal.  Neurological:     Mental Status: She is alert.    Review of Systems  Constitutional:  Negative for chills and fever.  Cardiovascular:  Negative for chest pain and palpitations.  Neurological:  Negative for dizziness, tingling, tremors and headaches.  Psychiatric/Behavioral:  Positive for depression and suicidal ideas. Negative for hallucinations, memory loss and substance abuse. The patient is nervous/anxious and has insomnia.    Blood pressure 115/73, pulse 81, temperature 98.3 F (36.8 C), temperature source Oral, resp. rate 20,  height 5\' 4"  (1.626 m), weight 90.7 kg, last menstrual period 03/09/2022, SpO2 99 %. Body mass index is 34.33 kg/m.  Treatment Plan Summary:  ASSESSMENT:  Diagnoses / Active Problems: Schizoaffective d/o BP type GAD PTSD Borderline PD H/o autism?   PLAN: Safety and Monitoring:  --  Voluntary admission to inpatient psychiatric unit for safety, stabilization and treatment  -- Daily contact with patient to assess and evaluate symptoms and progress in treatment  -- Patient's case to be discussed in multi-disciplinary team meeting  -- Observation Level : q15 minute checks  -- Vital signs:  q12 hours  -- Precautions: suicide, elopement, and assault  2. Psychiatric Diagnoses and Treatment:    -Continue vraylar 3 mg qd for schizoaffective do -continue lexapro 10 mg qd for gad, ptsd -continue prazosin 1 mg qhs for ptsd nightmares -continue lyrica 100 mg tid for fibromyalgia (outpt med) -continue metoprolol ER 12.5 mg qd for tachycarida   Pt is more stable psychiatrically in terms of mood lability and psychosis, compared to previous admission. After discussion with pt, she does not want changes to current outpt regimen, and we will continue for now. Her suicide ideation is more 2/2 borderline PD than unstable (undertreated) bipolar disorder.    --  The risks/benefits/side-effects/alternatives to this medication were discussed in detail with the patient and time was given for questions. The patient consents to medication trial.    -- Metabolic profile and EKG monitoring obtained while on an atypical antipsychotic (BMI: Lipid Panel: HbgA1c: QTc:)   -- Encouraged patient to participate in unit milieu and in scheduled group therapies   -- Short Term Goals: Ability to identify changes in lifestyle to reduce recurrence of condition will improve, Ability to verbalize feelings will improve, Ability to disclose and discuss suicidal ideas, Ability to demonstrate self-control will improve, Ability  to  identify and develop effective coping behaviors will improve, Ability to maintain clinical measurements within normal limits will improve, Compliance with prescribed medications will improve, and Ability to identify triggers associated with substance abuse/mental health issues will improve  -- Long Term Goals: Improvement in symptoms so as ready for discharge    3. Medical Issues Being Addressed:   Tobacco Use Disorder  -- Nicotine patch 21mg /24 hours ordered  -- Smoking cessation encouraged  4. Discharge Planning:   -- Social work and case management to assist with discharge planning and identification of hospital follow-up needs prior to discharge  -- Estimated LOS: 5-7 days  -- Discharge Concerns: Need to establish a safety plan; Medication compliance and effectiveness  -- Discharge Goals: Return home with outpatient referrals for mental health follow-up including medication management/psychotherapy   Observation Level/Precautions:  15 minute checks  Laboratory:  see above  Psychotherapy:    Medications:    Consultations:    Discharge Concerns:    Estimated LOS: 5-7 days   Other:     Physician Treatment Plan for Primary Diagnosis: Schizoaffective disorder, bipolar type [facility])   Physician Treatment Plan for Secondary Diagnosis: Principal Problem:   Schizoaffective disorder, bipolar type ([place]) Active Problems:   Borderline personality disorder ([place])   PTSD (post-traumatic stress disorder)   GAD (generalized anxiety disorder)   I certify that inpatient services furnished can reasonably be expected to improve the patient's condition.    Christoper Allegra, MD 3/15/20241:55 PM   Total Time Spent in Direct Patient Care:  I personally spent 60 minutes on the unit in direct patient care. The direct patient care time included face-to-face time with the patient, reviewing the patient's chart, communicating with other professionals, and coordinating care. Greater than 50% of  this time was spent in counseling or coordinating care with the patient regarding goals of hospitalization, psycho-education, and discharge planning needs.   Janine Limbo, MD Psychiatrist

## 2022-04-20 NOTE — Group Note (Signed)
Recreation Therapy Group Note   Group Topic:Problem Solving  Group Date: 04/20/2022 Start Time: 0935 End Time: 1005 Facilitators: Madiline Saffran-McCall, LRT,CTRS Location: [address] Dayroom   Goal Area(s) Addresses:  Patient will verbalize importance of using appropriate problem solving techniques.  Patient will identify positive change associated with effective problem solving skills.   Group Description:  Trivia.  In celebration of [place]. Patrick's Day, LRT engaged with group with St. Patrick's Day trivia.  LRT would read the questions and patients were given 10 seconds to write their responses down.  LRT would then give the group the correct answer.    Affect/Mood: N/A   Participation Level: Did not attend    Clinical Observations/Individualized Feedback:     Plan: Continue to engage patient in RT group sessions 2-3x/week.   Yolonda Purtle-McCall, LRT,CTRS 04/20/2022 12:52 PM

## 2022-04-20 NOTE — [facility] Group Notes (Signed)
[facility] Group Notes:  (Nursing/MHT/Case Management/Adjunct)  Date:  04/20/2022  Time:  8:18 PM  Type of Therapy:   AA  Participation Level:  Did Not Attend  Participation Quality:   na  Affect:   na  Cognitive:   na  Insight:  None  Engagement in Group:   na  Modes of Intervention:   na  Summary of Progress/Problems:didn't attend group.  Orvan Falconer 04/20/2022, 8:18 PM

## 2022-04-21 DIAGNOSIS — F250 Schizoaffective disorder, bipolar type: Secondary | ICD-10-CM

## 2022-04-21 LAB — HEMOGLOBIN A1C
Hgb A1c MFr Bld: 5.2 % (ref 4.8–5.6)
Mean Plasma Glucose: 103 mg/dL

## 2022-04-21 MED ORDER — POLYETHYLENE GLYCOL 3350 17 G PO PACK
17.0000 g | PACK | Freq: Every day | ORAL | Status: AC
  Administered 2022-04-21: 17 g via ORAL
  Filled 2022-04-21 (×2): qty 1

## 2022-04-21 MED ORDER — SENNOSIDES-DOCUSATE SODIUM 8.6-50 MG PO TABS
1.0000 | ORAL_TABLET | Freq: Every day | ORAL | Status: AC
  Administered 2022-04-21: 1 via ORAL
  Filled 2022-04-21 (×2): qty 1

## 2022-04-21 MED ORDER — SODIUM CHLORIDE 0.9 % IN NEBU
INHALATION_SOLUTION | RESPIRATORY_TRACT | Status: AC
  Filled 2022-04-21: qty 6

## 2022-04-21 NOTE — Progress Notes (Signed)
   04/20/22 2300  Psych Admission Type (Psych Patients Only)  Admission Status Voluntary  Psychosocial Assessment  Patient Complaints Isolation;Anxiety  Eye Contact Brief  Facial Expression Anxious  Affect Appropriate to circumstance  Speech Logical/coherent  Interaction Assertive  Motor Activity Rigidity  Appearance/Hygiene Improved  Behavior Characteristics Cooperative;Appropriate to situation  Mood Depressed  Thought Process  Coherency WDL  Content WDL  Delusions None reported or observed  Perception WDL  Hallucination None reported or observed  Judgment WDL  Confusion None  Danger to Self  Current suicidal ideation? Denies  Self-Injurious Behavior No self-injurious ideation or behavior indicators observed or expressed   Agreement Not to Harm Self Yes  Description of Agreement verbal  Danger to Others  Danger to Others None reported or observed  Danger to Others Abnormal  Harmful Behavior to others No threats or harm toward other people  Destructive Behavior No threats or harm toward property

## 2022-04-21 NOTE — Progress Notes (Signed)
[place] Group Notes:  (Nursing/MHT/Case Management/Adjunct)  Date:  04/21/2022  Time: 2000 Type of Therapy:   wrap up group  Participation Level:  Active  Participation Quality:  Appropriate, Attentive, Sharing, and Supportive  Affect:  Appropriate  Cognitive:  Alert  Insight:  Lacking  Engagement in Group:  Engaged  Modes of Intervention:  Clarification, Education, and Support  Summary of Progress/Problems: Positive thinking and positive change were discussed.   [name] 04/21/2022, 8:43 PM

## 2022-04-21 NOTE — Group Note (Signed)
Date:  04/21/2022 Time:  11:38 AM  Group Topic/Focus:  Goals Group:   The focus of this group is to help patients establish daily goals to achieve during treatment and discuss how the patient can incorporate goal setting into their daily lives to aide in recovery. Orientation:   The focus of this group is to educate the patient on the purpose and policies of crisis stabilization and provide a format to answer questions about their admission.  The group details unit policies and expectations of patients while admitted.    Participation Level:  Did Not Attend  Participation Quality:    Affect:    Cognitive:    Insight:   Engagement in Group:    Modes of Intervention:   Additional Comments:    Garvin Fila 04/21/2022, 11:38 AM

## 2022-04-21 NOTE — Group Note (Signed)
Date:  04/21/2022 Time:  4:11 PM  Group Topic/Focus:  Self Care:   The focus of this group is to help patients understand the importance of self-care in order to improve or restore emotional, physical, spiritual, interpersonal, and financial health. Wellness Toolbox:   The focus of this group is to discuss various aspects of wellness, balancing those aspects and exploring ways to increase the ability to experience wellness.  Patients will create a wellness toolbox for use upon discharge.    Participation Level:  Did Not Attend  Participation Quality:    Affect:    Cognitive:    Insight:   Engagement in Group:    Modes of Intervention:    Additional Comments:    Garvin Fila 04/21/2022, 4:11 PM

## 2022-04-21 NOTE — [facility] Counselor (Signed)
Adult Comprehensive Assessment  Patient ID: [name], female   DOB: 1990/10/26, 32 y.o.   MRN: [id number]  Information Source: Information source: Patient  Current Stressors:  Patient states their primary concerns and needs for treatment are:: pt presents to [facility] after attempting to die by suicide by cutting her wrist. pt attributes this to feeling frustrated after learning that she has to have 3000 dollars in order to retain an attorney in pursuit of regaining custody of her daughter Patient states their goals for this hospitilization and ongoing recovery are:: Medication Stabilization Educational / Learning stressors: Autism Employment / Job issues: none reported Family Relationships: "My mom hates me and wants nothing to do with me Financial / Lack of resources (include bankruptcy): none reported Housing / Lack of housing: none reported Physical health (include injuries & life threatening diseases): pt denies Social relationships: none reported Substance abuse: pt denies Bereavement / Loss: pt denies  Living/Environment/Situation:  Living Arrangements: Non-relatives/Friends Who else lives in the home?: My female friend How long has patient lived in current situation?: 7 years What is atmosphere in current home: Supportive  Family History:  Marital status: Widowed Widowed, when?: 2018. What types of issues is patient dealing with in the relationship?: None. Are you sexually active?: No What is your sexual orientation?: "straight" Has your sexual activity been affected by drugs, alcohol, medication, or emotional stress?: "No" Does patient have children?: Yes How many children?: 1 How is patient's relationship with their children?: Pt reports, she's about to see her daughter. Pt reports, she's in talks with her daughters guardian (her deceased husbands mother) to have her over the summer.  Childhood History:  By whom was/is the patient raised?: Both  parents  Education:  Highest grade of school patient has completed: Some college Currently a student?: No Learning disability?: Yes What learning problems does patient have?: Spectrum Disorder  Employment/Work Situation:   Employment Situation: On disability Work Stressors: Pt is on disability. Why is Patient on Disability: Pt reports, for medical, mental health and physical reasons. How Long has Patient Been on Disability: Years. Patient's Job has Been Impacted by Current Illness: No Describe how Patient's Job has Been Impacted: Pt is on disability. 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 Has Patient ever Been in the [employer]?: No  Financial Resources:   Museum/gallery curator resources: [employer], [employer], Teacher, early years/pre Does patient have a Programmer, applications or guardian?: Yes Name of representative payee or guardian: Psychologist, forensic  Alcohol/Substance Abuse:   Alcohol/Substance Abuse Treatment Hx: Denies past history  Social Support System:   Heritage manager System: Good Describe Community Support System: [facility] Type of faith/religion: Christian How does patient's faith help to cope with current illness?: I pray  Leisure/Recreation:   Do You Have Hobbies?: Yes Leisure and Hobbies: Color, diamond painting.  Strengths/Needs:   What is the patient's perception of their strengths?: I am smart and Beautiful Patient states these barriers may affect/interfere with their treatment: Seeing my daughter Patient states these barriers may affect their return to the community: none reported  Discharge Plan:   Currently receiving community mental health services: Yes (From Whom) Patient states concerns and preferences for aftercare planning are: [facility] Patient states they will know when they are safe and ready for discharge when: Once I talk to another lawyer Does patient have access to  transportation?: No Does patient have financial barriers related to discharge medications?:  No Patient description of barriers related to discharge medications: none reported Plan for no access to transportation at discharge: pt will require transportation Will patient be returning to same living situation after discharge?: Yes  Summary/Recommendations:   Summary and Recommendations (to be completed by the evaluator): 32 year old pt reports subsequently becoming suicidal and depressed and requested admission to the ED.  Reports that she has continued to have nightmares although the first night being discharged to [facility] and taking an increased dose of hydroxyzine which was she was able to sleep better and did not experience any night terrors.  Serous followed by an ACT team and is seen 3 times weekly however continues to have several ED or [facility] encounters ongoing over the last several months and routinely attributes his the same circumstances as the underlying reason for the suicidal ideation she is experiencing.  Patient is currently having custody issues involving her daughter and continues to ruminate on her husband's suicide a few years back. Gilda was advised to contact her ACT team to schedule encounter on Monday if possible oppose to her visit which is scheduled for Tuesday. Patient is asking for taxi as she has a birthday party to attend at noon and she feels the bus will be unable to get her to the location on time.  Patient reports she is already purchased gifts for her daughter and urgently needs to obtain transportation assistance so that she can wait.  Advised that I will consult with social work in the emergency department to see if this can be arranged otherwise she will receive a bus pass. Patient denies any plans for self harm and endorses SI is passive and comes in and goes is triggered by interactions with mother in law pertaining to her daughter.  Patient is forward thinking although  endorses passive intermittent suicidality is able to contract for safety and plans to follow-up with ACT team. While here, Tifanny can benefit from crisis stabilization, medication management, therapeutic milieu, and referrals for services.  [place]. 04/21/2022

## 2022-04-21 NOTE — Progress Notes (Signed)
[facility] MD Progress Note  04/21/2022 8:18 AM [name]  MRN:  [id number] Principal Problem: Schizoaffective disorder, bipolar type ([place]) Diagnosis: Principal Problem:   Schizoaffective disorder, bipolar type ([place]) Active Problems:   Borderline personality disorder ([place])   PTSD (post-traumatic stress disorder)   GAD (generalized anxiety disorder)   Reason for Admission: [name] is a 32 year old female with a psychiatric history of Schizoaffective disorder bipolar type, Anxiety, Autism spectrum disorder, PTSD, Borderline personality disorder, who was admitted to this psychiatric unit for evaluation and treatment after suicide ideation and attempt via cutting self. This is hospitalization day 2.  Subjective:  Patient seen and assessed at bedside.  Patient denies SI/HI/AVH today.  Patient reports no acute somatic complaints at this time.  Patient is agreeable to continue current medication regimen.  Patient reports some improvement in depression today compared to yesterday.  Patient states that she is sleeping and eating well.  Patient complains of constipation for past 3 days but denies any other GI complaints.  Patient is agreeable to starting MiraLAX and Senokot for 2 days in order to aid with bowel movement.  All questions were addressed.  Patient was encouraged to attend groups and she says that she will attend groups today.  Objective:  Chart Review Past 24 hours of patient's chart was reviewed.  Patient is compliant with scheduled meds. Required Agitation PRNs: none Per RN notes, no documented behavioral issues and is not attending group. Patient slept, undocumented hours  Total Time spent with patient: 45 minutes  Past Psychiatric History:  Previous Psych Diagnoses: Schizoaffective disorder bipolar type, Anxiety, Autism spectrum disorder, PTSD, Borderline personality disorder Prior inpatient treatment: multiple Current/prior outpatient treatment: [facility] Prior rehab  hx: denies multiple Psychotherapy hx: [facility] History of suicide: 20-30 times by overdosing on pills, cutting or drinking 409 disinfectant History of homicide or aggression: denies Psychiatric medication history:  Previously trialed Tegretol, Haldol, Clozaril, Depakote, Invega, Lamictal, Seroquel, asenapine, abilify, vraylar, Lexapro, Zoloft  Neuromodulation history: denies  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 fragile X syndrome    Chronic constipation    Depression    Drug-seeking behavior    Essential tremor    Headache    Overdose of acetaminophen 07/2017   and other meds   Personality disorder ([place])    Schizo-affective psychosis ([place])    Schizoaffective disorder, bipolar type ([place])    Seizures ([place])    Last seizure December 2017   Sleep apnea     Past Surgical History:  Procedure Laterality Date   MOUTH SURGERY  2009 or 2010   Family History:  Family History  Problem Relation Age of Onset   Mental illness Father    Asthma Father    PDD Brother    Seizures Brother    Family Psychiatric  History: Father has PTSD, Mother and Brother has Autism, Brother has depression  Social History:  Social History   Substance and Sexual Activity  Alcohol Use No   Alcohol/week: 1.0 standard drink of alcohol   Types: 1 Standard drinks or equivalent per week   Comment: denies at this time     Social History   Substance and Sexual Activity  Drug Use No   Comment: History of cocaine use at age 73 for 62 months    Social History  Socioeconomic History   Marital status: Widowed    Spouse name: Not on file   Number of children: 0   Years of education: Not on file   Highest education level: Not on file  Occupational History   Occupation: disability  Tobacco Use   Smoking status: Former    Packs/day: 0    Types: Cigarettes   Smokeless tobacco: Never    Tobacco comments:    Smoked for 2  years age [age]  Vaping Use   Vaping Use: Never used  Substance and Sexual Activity   Alcohol use: No    Alcohol/week: 1.0 standard drink of alcohol    Types: 1 Standard drinks or equivalent per week    Comment: denies at this time   Drug use: No    Comment: History of cocaine use at age 63 for 4 months   Sexual activity: Not Currently    Birth control/protection: None  Other Topics Concern   Not on file  Social History Narrative   Marital status: Widowed      Children: daughter      Lives: with boyfriend, in two story home      Employment:  Disability      Tobacco: quit smoking; smoked for two years.      Alcohol ;none      Drugs: none   Has not traveled outside of the country.   Right handed         Social Determinants of Health   Financial Resource Strain: Not on file  Food Insecurity: No Food Insecurity (04/19/2022)   Hunger Vital Sign    Worried About Running Out of Food in the Last Year: Never true    Ran Out of Food in the Last Year: Never true  Transportation Needs: Unmet Transportation Needs (04/19/2022)   PRAPARE - Hydrologist (Medical): Yes    Lack of Transportation (Non-Medical): Yes  Physical Activity: Not on file  Stress: Not on file  Social Connections: Not on file   Additional Social History:                         Current Medications: Current Facility-Administered Medications  Medication Dose Route Frequency Provider Last Rate Last Admin   acetaminophen (TYLENOL) tablet 650 mg  650 mg Oral Q6H PRN Motley-Mangrum, Jadeka A, PMHNP       albuterol (VENTOLIN HFA) 108 (90 Base) MCG/ACT inhaler 2 puff  2 puff Inhalation Q6H PRN Motley-Mangrum, Jadeka A, PMHNP       alum & mag hydroxide-simeth (MAALOX/MYLANTA) 200-200-20 MG/5ML suspension 30 mL  30 mL Oral Q4H PRN Motley-Mangrum, Jadeka A, PMHNP       cariprazine (VRAYLAR) capsule 3 mg  3 mg Oral Daily Motley-Mangrum, Jadeka A, PMHNP    3 mg at 04/21/22 0801   diphenhydrAMINE (BENADRYL) capsule 50 mg  50 mg Oral TID PRN Motley-Mangrum, Jadeka A, PMHNP       Or   diphenhydrAMINE (BENADRYL) injection 50 mg  50 mg Intramuscular TID PRN Motley-Mangrum, Jadeka A, PMHNP       escitalopram (LEXAPRO) tablet 10 mg  10 mg Oral Daily Motley-Mangrum, Jadeka A, PMHNP   10 mg at 04/21/22 0801   hydrOXYzine (ATARAX) tablet 25 mg  25 mg Oral TID PRN Motley-Mangrum, Jadeka A, PMHNP   25 mg at 04/20/22 0744   LORazepam (ATIVAN) tablet 2 mg  2 mg Oral TID PRN Motley-Mangrum, Al Pimple, PMHNP  Or   LORazepam (ATIVAN) injection 2 mg  2 mg Intramuscular TID PRN Motley-Mangrum, Jadeka A, PMHNP       magnesium hydroxide (MILK OF MAGNESIA) suspension 30 mL  30 mL Oral Daily PRN Motley-Mangrum, Jadeka A, PMHNP       melatonin tablet 3 mg  3 mg Oral QHS Motley-Mangrum, Jadeka A, PMHNP   3 mg at 04/20/22 2048   metoprolol succinate (TOPROL-XL) 24 hr tablet 12.5 mg  12.5 mg Oral Daily Motley-Mangrum, Jadeka A, PMHNP   12.5 mg at 04/21/22 0801   mometasone-formoterol (DULERA) 200-5 MCG/ACT inhaler 2 puff  2 puff Inhalation BID Lindell Spar I, NP   2 puff at 04/21/22 0801   pantoprazole (PROTONIX) EC tablet 40 mg  40 mg Oral Daily Motley-Mangrum, Jadeka A, PMHNP   40 mg at 04/21/22 0801   prazosin (MINIPRESS) capsule 1 mg  1 mg Oral QHS Motley-Mangrum, Jadeka A, PMHNP   1 mg at 04/20/22 2052   pregabalin (LYRICA) capsule 100 mg  100 mg Oral TID Motley-Mangrum, Jadeka A, PMHNP   100 mg at 04/21/22 0801   traZODone (DESYREL) tablet 50 mg  50 mg Oral QHS PRN Motley-Mangrum, Jadeka A, PMHNP        Lab Results:  No results found for this or any previous visit (from the past 24 hour(s)).  Blood Alcohol level:  Lab Results  Component Value Date   ETH <10 04/18/2022   ETH <10 [id number]    Metabolic Disorder Labs: Lab Results  Component Value Date   HGBA1C 5.2 04/20/2022   MPG 103 04/20/2022   MPG 96.8 10/07/2021   Lab Results  Component Value  Date   PROLACTIN 66.2 (H) 03/02/2021   PROLACTIN 66.1 (H) 02/04/2020   Lab Results  Component Value Date   CHOL 191 04/20/2022   TRIG 163 (H) 04/20/2022   HDL 52 04/20/2022   CHOLHDL 3.7 04/20/2022   VLDL 33 04/20/2022   LDLCALC 106 (H) 04/20/2022   LDLCALC 102 (H) 12/10/2021    Physical Findings:  Musculoskeletal: Strength & Muscle Tone: within normal limits Gait & Station: normal  Psychiatric Specialty Exam:  Presentation  General Appearance:  Casual; Fairly Groomed   Eye Contact: Good   Speech: Normal Rate; Clear and Coherent   Speech Volume: Normal   Handedness: Right    Mood and Affect  Mood: Anxious; Depressed   Affect: Congruent; Full Range    Thought Process  Thought Processes: Linear   Descriptions of Associations:Intact   Orientation:Full (Time, Place and Person)   Thought Content:Logical   History of Schizophrenia/Schizoaffective disorder:Yes   Duration of Psychotic Symptoms:N/A (none current)   Hallucinations:Hallucinations: None  Ideas of Reference:None   Suicidal Thoughts:Suicidal Thoughts: No  Homicidal Thoughts:Homicidal Thoughts: No   Sensorium  Memory: Immediate Good; Recent Good; Remote Good   Judgment: Fair   Insight: Fair    Community education officer  Concentration: Fair   Attention Span: Fair   Recall: Good   Fund of Knowledge: Good   Language: Good    Psychomotor Activity  Psychomotor Activity: Psychomotor Activity: Normal   Assets  Assets: Communication Skills; Social Support; Resilience; Physical Health    Sleep  Sleep: Sleep: Poor    Physical Exam: ROS Blood pressure 111/70, pulse (!) 126, temperature 98.3 F (36.8 C), temperature source Oral, resp. rate 20, height 5\' 4"  (1.626 m), weight 90.7 kg, last menstrual period 03/09/2022, SpO2 100 %. Body mass index is 34.33 kg/m.   ASSESSMENT AND PLAN [name]  is a 32 year old female with a psychiatric  history of Schizoaffective disorder bipolar type, Anxiety, Autism spectrum disorder, PTSD, Borderline personality disorder, who was admitted to this psychiatric unit for evaluation and treatment after suicide ideation and attempt via cutting self. This is hospitalization day 2.  Patient appears to be less depressed today.  Patient eager to attend group today.  Complains of constipation so started Senokot and MiraLAX to help with bowel movement.  PLAN Safety and Monitoring: Voluntary admission to inpatient psychiatric unit for safety, stabilization and treatment Daily contact with patient to assess and evaluate symptoms and progress in treatment Patient's case to be discussed in multi-disciplinary team meeting Observation Level : q15 minute checks Vital signs: q12 hours Precautions: suicide, elopement, and assault   Psychiatric Problems Schizoaffective d/o BP type GAD PTSD Borderline PD H/o autism?  -Continue vraylar 3 mg qd for schizoaffective do -continue lexapro 10 mg qd for gad, ptsd -continue prazosin 1 mg qhs for ptsd nightmares -continue lyrica 100 mg tid for fibromyalgia (outpt med) -continue metoprolol ER 12.5 mg qd for tachycarida   Medical Problems Tobacco use disorder -Nicotine patch for NRT  Constipation -MiraLAX and Senokot daily for 2 doses  PRNs Tylenol 650 mg for mild pain Maalox/Mylanta 30 mL for indigestion Hydroxyzine 25 mg tid for anxiety Milk of Magnesia 30 mL for constipation Trazodone 50 mg for sleep   4. Discharge Planning: Social work and case management to assist with discharge planning and identification of hospital follow-up needs prior to discharge Estimated LOS: 3-5 days Discharge Concerns: Need to establish a safety plan; Medication compliance and effectiveness Discharge Goals: Return home with outpatient referrals for mental health follow-up including medication management/psychotherapy     France Ravens, MD 04/21/2022, 8:18 AM

## 2022-04-21 NOTE — Progress Notes (Signed)
   04/21/22 1100  Psych Admission Type (Psych Patients Only)  Admission Status Voluntary  Psychosocial Assessment  Patient Complaints Anxiety  Eye Contact Brief  Facial Expression Anxious  Affect Appropriate to circumstance  Speech Logical/coherent;Rapid  Interaction Assertive  Motor Activity Fidgety  Appearance/Hygiene Unremarkable  Behavior Characteristics Cooperative;Appropriate to situation  Mood Anxious  Thought Process  Coherency WDL  Content WDL  Delusions None reported or observed  Perception WDL  Hallucination None reported or observed  Judgment WDL  Confusion None  Danger to Self  Current suicidal ideation? Denies  Self-Injurious Behavior No self-injurious ideation or behavior indicators observed or expressed   Agreement Not to Harm Self Yes  Description of Agreement verbal contract  Danger to Others  Danger to Others None reported or observed  Danger to Others Abnormal  Harmful Behavior to others No threats or harm toward other people  Destructive Behavior No threats or harm toward property

## 2022-04-22 MED ORDER — IBUPROFEN 600 MG PO TABS
600.0000 mg | ORAL_TABLET | Freq: Once | ORAL | Status: AC
  Administered 2022-04-22: 600 mg via ORAL
  Filled 2022-04-22 (×2): qty 1

## 2022-04-22 NOTE — [facility] Group Notes (Signed)
Pt did not attend goals group. 

## 2022-04-22 NOTE — Progress Notes (Addendum)
[facility] MD Progress Note  04/22/2022 11:11 AM [name]  MRN:  [id number] Principal Problem: Schizoaffective disorder, bipolar type ([place]) Diagnosis: Principal Problem:   Schizoaffective disorder, bipolar type ([place]) Active Problems:   Borderline personality disorder ([place])   PTSD (post-traumatic stress disorder)   GAD (generalized anxiety disorder)   Reason for Admission: [name] is a 32 year old female with a psychiatric history of Schizoaffective disorder bipolar type, Anxiety, Autism spectrum disorder, PTSD, Borderline personality disorder, who was admitted to this psychiatric unit for evaluation and treatment after suicide ideation and attempt via cutting self. This is hospitalization day 3.  Subjective:  Patient seen and assessed at bedside.  Patient presented somewhat dysphoric today.  It appears she had some frustration related to family as she reports that he plans on living by herself keeping to herself and not wanting to do anything with family. She perseverated on discharge as she feels she has been "good" and not been agitated.  I applauded her for this and encouraged her to continue this.  She was very insistent on discharging today stating that she feels that she has maximized the benefits she received this hospitalization.  I stated that we would want to monitor for additional day with plan to discharge likely tomorrow.  She appeared frustrated by this but appeared to begrudgingly accepted that we would be continuing with the plan.  She asked for a spiritual chaplain to stop by.   Patient denies SI/HI/AVH today.  Patient reports no acute somatic complaints at this time. Patient states that she is sleeping and eating well.  Patient had a bowel movement and denies GI complaints. All questions were addressed.  Patient was encouraged to attend groups.  Objective:  Chart Review Past 24 hours of patient's chart was reviewed.  Patient is compliant with scheduled  meds. Required Agitation PRNs: none Per RN notes, patient is attending group but was noted to be pacing the halls this morning agitated, no agitation PRNs required. Patient slept, undocumented hours  Total Time spent with patient: 45 minutes  Past Psychiatric History:  Previous Psych Diagnoses: Schizoaffective disorder bipolar type, Anxiety, Autism spectrum disorder, PTSD, Borderline personality disorder Prior inpatient treatment: multiple Current/prior outpatient treatment: [facility] Prior rehab hx: denies multiple Psychotherapy hx: [facility] History of suicide: 20-30 times by overdosing on pills, cutting or drinking 409 disinfectant History of homicide or aggression: denies Psychiatric medication history:  Previously trialed Tegretol, Haldol, Clozaril, Depakote, Invega, Lamictal, Seroquel, asenapine, abilify, vraylar, Lexapro, Zoloft  Neuromodulation history: denies  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 fragile X syndrome    Chronic constipation    Depression    Drug-seeking behavior    Essential tremor    Headache    Overdose of acetaminophen 07/2017   and other meds   Personality disorder ([place])    Schizo-affective psychosis ([place])    Schizoaffective disorder, bipolar type ([place])    Seizures ([place])    Last seizure December 2017   Sleep apnea     Past Surgical History:  Procedure Laterality Date   MOUTH SURGERY  2009 or 2010   Family History:  Family History  Problem Relation Age of Onset   Mental illness Father    Asthma Father    PDD Brother    Seizures Brother    Family Psychiatric  History: Father has PTSD, Mother and Brother has Autism, Brother has depression  Social History:  Social History   Substance and Sexual Activity  Alcohol Use No   Alcohol/week: 1.0 standard drink of alcohol   Types: 1 Standard drinks or equivalent per week   Comment:  denies at this time     Social History   Substance and Sexual Activity  Drug Use No   Comment: History of cocaine use at age 65 for 4 months    Social History   Socioeconomic History   Marital status: Widowed    Spouse name: Not on file   Number of children: 0   Years of education: Not on file   Highest education level: Not on file  Occupational History   Occupation: disability  Tobacco Use   Smoking status: Former    Packs/day: 0    Types: Cigarettes   Smokeless tobacco: Never   Tobacco comments:    Smoked for 2  years age [age]  Vaping Use   Vaping Use: Never used  Substance and Sexual Activity   Alcohol use: No    Alcohol/week: 1.0 standard drink of alcohol    Types: 1 Standard drinks or equivalent per week    Comment: denies at this time   Drug use: No    Comment: History of cocaine use at age 49 for 4 months   Sexual activity: Not Currently    Birth control/protection: None  Other Topics Concern   Not on file  Social History Narrative   Marital status: Widowed      Children: daughter      Lives: with boyfriend, in two story home      Employment:  Disability      Tobacco: quit smoking; smoked for two years.      Alcohol ;none      Drugs: none   Has not traveled outside of the country.   Right handed         Social Determinants of Health   Financial Resource Strain: Not on file  Food Insecurity: No Food Insecurity (04/19/2022)   Hunger Vital Sign    Worried About Running Out of Food in the Last Year: Never true    Ran Out of Food in the Last Year: Never true  Transportation Needs: Unmet Transportation Needs (04/19/2022)   PRAPARE - Hydrologist (Medical): Yes    Lack of Transportation (Non-Medical): Yes  Physical Activity: Not on file  Stress: Not on file  Social Connections: Not on file   Additional Social History:                         Current Medications: Current Facility-Administered Medications   Medication Dose Route Frequency Provider Last Rate Last Admin   acetaminophen (TYLENOL) tablet 650 mg  650 mg Oral Q6H PRN Motley-Mangrum, Jadeka A, PMHNP       albuterol (VENTOLIN HFA) 108 (90 Base) MCG/ACT inhaler 2 puff  2 puff Inhalation Q6H PRN Motley-Mangrum, Jadeka A, PMHNP   2 puff at 04/21/22 1238   alum & mag hydroxide-simeth (MAALOX/MYLANTA) 200-200-20 MG/5ML suspension 30 mL  30 mL Oral Q4H PRN Motley-Mangrum, Jadeka A, PMHNP       cariprazine (VRAYLAR) capsule 3 mg  3 mg Oral Daily Motley-Mangrum, Jadeka A, PMHNP   3 mg at 04/22/22 0732   diphenhydrAMINE (BENADRYL) capsule 50 mg  50 mg Oral TID PRN Motley-Mangrum, Al Pimple, PMHNP  Or   diphenhydrAMINE (BENADRYL) injection 50 mg  50 mg Intramuscular TID PRN Motley-Mangrum, Donneta Romberg A, PMHNP       escitalopram (LEXAPRO) tablet 10 mg  10 mg Oral Daily Motley-Mangrum, Jadeka A, PMHNP   10 mg at 04/22/22 0732   hydrOXYzine (ATARAX) tablet 25 mg  25 mg Oral TID PRN Motley-Mangrum, Jadeka A, PMHNP   25 mg at 04/22/22 1019   LORazepam (ATIVAN) tablet 2 mg  2 mg Oral TID PRN Motley-Mangrum, Donneta Romberg A, PMHNP       Or   LORazepam (ATIVAN) injection 2 mg  2 mg Intramuscular TID PRN Motley-Mangrum, Jadeka A, PMHNP       magnesium hydroxide (MILK OF MAGNESIA) suspension 30 mL  30 mL Oral Daily PRN Motley-Mangrum, Jadeka A, PMHNP       melatonin tablet 3 mg  3 mg Oral QHS Motley-Mangrum, Jadeka A, PMHNP   3 mg at 04/21/22 2106   metoprolol succinate (TOPROL-XL) 24 hr tablet 12.5 mg  12.5 mg Oral Daily Motley-Mangrum, Jadeka A, PMHNP   12.5 mg at 04/22/22 0732   mometasone-formoterol (DULERA) 200-5 MCG/ACT inhaler 2 puff  2 puff Inhalation BID Lindell Spar I, NP   2 puff at 04/22/22 0730   pantoprazole (PROTONIX) EC tablet 40 mg  40 mg Oral Daily Motley-Mangrum, Jadeka A, PMHNP   40 mg at 04/22/22 0732   polyethylene glycol (MIRALAX / GLYCOLAX) packet 17 g  17 g Oral Daily France Ravens, MD   17 g at 04/21/22 1104   prazosin (MINIPRESS) capsule 1 mg   1 mg Oral QHS Motley-Mangrum, Jadeka A, PMHNP   1 mg at 04/21/22 2106   pregabalin (LYRICA) capsule 100 mg  100 mg Oral TID Motley-Mangrum, Jadeka A, PMHNP   100 mg at 04/22/22 0732   senna-docusate (Senokot-S) tablet 1 tablet  1 tablet Oral Daily France Ravens, MD   1 tablet at 04/21/22 1104   traZODone (DESYREL) tablet 50 mg  50 mg Oral QHS PRN Motley-Mangrum, Al Pimple, PMHNP        Lab Results:  No results found for this or any previous visit (from the past 24 hour(s)).  Blood Alcohol level:  Lab Results  Component Value Date   ETH <10 04/18/2022   ETH <10 [id number]    Metabolic Disorder Labs: Lab Results  Component Value Date   HGBA1C 5.2 04/20/2022   MPG 103 04/20/2022   MPG 96.8 10/07/2021   Lab Results  Component Value Date   PROLACTIN 66.2 (H) 03/02/2021   PROLACTIN 66.1 (H) 02/04/2020   Lab Results  Component Value Date   CHOL 191 04/20/2022   TRIG 163 (H) 04/20/2022   HDL 52 04/20/2022   CHOLHDL 3.7 04/20/2022   VLDL 33 04/20/2022   LDLCALC 106 (H) 04/20/2022   LDLCALC 102 (H) 12/10/2021    Physical Findings:  Musculoskeletal: Strength & Muscle Tone: within normal limits Gait & Station: normal  Psychiatric Specialty Exam:  Presentation  General Appearance:  Appropriate for Environment; Casual   Eye Contact: Good   Speech: Clear and Coherent; Normal Rate   Speech Volume: Normal   Handedness: Right    Mood and Affect  Mood: Dysphoric   Affect: Labile    Thought Process  Thought Processes: Coherent; Goal Directed; Linear   Descriptions of Associations:Intact   Orientation:Full (Time, Place and Person)   Thought Content:Perseveration   History of Schizophrenia/Schizoaffective disorder:Yes   Duration of Psychotic Symptoms:N/A   Hallucinations:Hallucinations: None   Ideas of Reference:None  Suicidal Thoughts:Suicidal Thoughts: No   Homicidal Thoughts:Homicidal Thoughts: No    Sensorium   Memory: Remote Good   Judgment: Fair   Insight: Fair    Materials engineer: Fair   Attention Span: Fair   Recall: Good   Fund of Knowledge: Good   Language: Good    Psychomotor Activity  Psychomotor Activity: Psychomotor Activity: Normal    Assets  Assets: Communication Skills; Resilience; Social Support; Physical Health    Sleep  Sleep: Sleep: Fair     Physical Exam: ROS Blood pressure 132/86, pulse (!) 103, temperature 98.3 F (36.8 C), temperature source Oral, resp. rate 20, height 5\' 4"  (1.626 m), weight 90.7 kg, last menstrual period 03/09/2022, SpO2 100 %. Body mass index is 34.33 kg/m.   ASSESSMENT AND PLAN [name] is a 32 year old female with a psychiatric history of Schizoaffective disorder bipolar type, Anxiety, Autism spectrum disorder, PTSD, Borderline personality disorder, who was admitted to this psychiatric unit for evaluation and treatment after suicide ideation and attempt via cutting self. This is hospitalization day 3.  Patient appears anxious and irritable today. She was redirectable and able to keep calm so will continue plan for discharge for tomorrow if she is able to continue controlling her mood.  PLAN Safety and Monitoring: Voluntary admission to inpatient psychiatric unit for safety, stabilization and treatment Daily contact with patient to assess and evaluate symptoms and progress in treatment Patient's case to be discussed in multi-disciplinary team meeting Observation Level : q15 minute checks Vital signs: q12 hours Precautions: suicide, elopement, and assault   Psychiatric Problems Schizoaffective d/o BP type GAD PTSD Borderline PD H/o autism?  -Continue vraylar 3 mg qd for schizoaffective do -continue lexapro 10 mg qd for gad, ptsd -continue prazosin 1 mg qhs for ptsd nightmares -continue lyrica 100 mg tid for fibromyalgia (outpt med) -continue metoprolol ER 12.5 mg qd for  tachycarida   Medical Problems Tobacco use disorder -Nicotine patch for NRT  Constipation, resolved  PRNs Tylenol 650 mg for mild pain Maalox/Mylanta 30 mL for indigestion Hydroxyzine 25 mg tid for anxiety Milk of Magnesia 30 mL for constipation Trazodone 50 mg for sleep   4. Discharge Planning: Social work and case management to assist with discharge planning and identification of hospital follow-up needs prior to discharge Tentative discharge date 04/23/22 Discharge Concerns: Need to establish a safety plan; Medication compliance and effectiveness Discharge Goals: Return home with outpatient referrals for mental health follow-up including medication management/psychotherapy     France Ravens, MD 04/22/2022, 11:11 AM

## 2022-04-22 NOTE — Progress Notes (Signed)
Pt woke up around 4 am and started pacing on the hallway. When asked if she needed anything, pt became irritable saying she did not want to talk to anybody except the doctor. Pt went back to her room and was later observed lying down on bed crying. Will continue to monitor.

## 2022-04-22 NOTE — [facility] Group Notes (Signed)
[facility] Group Notes:  (Nursing/)  Date:  04/22/2022  Time:  1:58 PM  Type of Therapy:  Psychoeducational Skills  Participation Level:  Did Not Attend   Waymond Cera 04/22/2022, 1:58 PM

## 2022-04-22 NOTE — Progress Notes (Signed)
   04/22/22 1300  Psych Admission Type (Psych Patients Only)  Admission Status Voluntary  Psychosocial Assessment  Patient Complaints Anxiety;Depression  Eye Contact Fair  Facial Expression Anxious  Affect Appropriate to circumstance  Speech Logical/coherent  Interaction Assertive  Motor Activity Fidgety  Appearance/Hygiene Unremarkable  Behavior Characteristics Cooperative;Appropriate to situation  Mood Anxious  Thought Process  Coherency WDL  Content WDL  Delusions None reported or observed  Perception WDL  Hallucination None reported or observed  Judgment Limited  Confusion None  Danger to Self  Current suicidal ideation? Denies  Self-Injurious Behavior No self-injurious ideation or behavior indicators observed or expressed   Agreement Not to Harm Self Yes  Description of Agreement verbal contract for safety  Danger to Others  Danger to Others None reported or observed  Danger to Others Abnormal  Harmful Behavior to others No threats or harm toward other people  Destructive Behavior No threats or harm toward property

## 2022-04-22 NOTE — Progress Notes (Signed)
D) Pt received calm, visible, participating in milieu, and in no acute distress. Pt A & O x4. Pt denies SI, HI, A/ V H, depression, anxiety and pain at this time. A) Pt encouraged to drink fluids. Pt encouraged to come to staff with needs. Pt encouraged to attend and participate in groups. Pt encouraged to set reachable goals.  R) Pt remained safe on unit, in no acute distress, will continue to assess.     04/22/22 2000  Psych Admission Type (Psych Patients Only)  Admission Status Voluntary  Psychosocial Assessment  Patient Complaints Anxiety;Depression  Eye Contact Fair  Facial Expression Anxious  Affect Appropriate to circumstance  Speech Logical/coherent  Interaction Assertive  Motor Activity Fidgety  Appearance/Hygiene Unremarkable  Behavior Characteristics Agitated  Mood Anxious  Thought Process  Coherency WDL  Content WDL  Delusions None reported or observed  Perception WDL  Hallucination None reported or observed  Judgment Limited  Confusion None  Danger to Self  Current suicidal ideation? Denies  Self-Injurious Behavior No self-injurious ideation or behavior indicators observed or expressed   Agreement Not to Harm Self Yes  Description of Agreement Verbal  Danger to Others  Danger to Others None reported or observed  Danger to Others Abnormal  Harmful Behavior to others No threats or harm toward other people  Destructive Behavior No threats or harm toward property

## 2022-04-22 NOTE — Progress Notes (Signed)
   04/21/22 2200  Psych Admission Type (Psych Patients Only)  Admission Status Voluntary  Psychosocial Assessment  Patient Complaints None  Eye Contact Fair  Facial Expression Animated  Affect Appropriate to circumstance  Speech Logical/coherent  Interaction Assertive  Motor Activity Fidgety  Appearance/Hygiene Unremarkable  Behavior Characteristics Cooperative;Appropriate to situation  Mood Pleasant  Thought Process  Coherency WDL  Content WDL  Delusions None reported or observed  Perception WDL  Hallucination None reported or observed  Judgment Limited  Confusion None  Danger to Self  Current suicidal ideation? Denies  Self-Injurious Behavior No self-injurious ideation or behavior indicators observed or expressed   Agreement Not to Harm Self Yes  Description of Agreement verbal  Danger to Others  Danger to Others None reported or observed  Danger to Others Abnormal  Harmful Behavior to others No threats or harm toward other people  Destructive Behavior No threats or harm toward property

## 2022-04-22 NOTE — Progress Notes (Signed)
D. Pt presented depressed, irritable today, and remained isolated for much of the shift. Pt observed at times in the hall carrying her bag of belongings, telling staff that she was ready to leave. Per staff, pt observed talking to herself, but when this nurse asked, pt denied A/VH. Pt denies suicidal ideation, but endorses thoughts of self harm, but stated,  "I promise I won't do that anymore." A. Labs and vitals monitored. Pt compliant with medications. Pt supported emotionally and encouraged to express concerns and ask questions.   R. Pt remains safe with 15 minute checks. Will continue POC.

## 2022-04-22 NOTE — [facility] Group Notes (Signed)
The focus of this group is to help patients review their daily goal of treatment and discuss progress on daily workbooks. Pt was not present for tonight's wrap up group.  

## 2022-04-23 DIAGNOSIS — F319 Bipolar disorder, unspecified: Secondary | ICD-10-CM | POA: Diagnosis not present

## 2022-04-23 MED ORDER — PREGABALIN 100 MG PO CAPS
100.0000 mg | ORAL_CAPSULE | Freq: Once | ORAL | Status: AC
  Administered 2022-04-23: 100 mg via ORAL
  Filled 2022-04-23: qty 1

## 2022-04-23 MED ORDER — OLANZAPINE 10 MG IM SOLR: 5.0000 mg | Freq: Three times a day (TID) | INTRAMUSCULAR | Status: DC | PRN

## 2022-04-23 MED ORDER — LORAZEPAM 1 MG PO TABS
1.0000 mg | ORAL_TABLET | Freq: Once | ORAL | Status: AC
  Administered 2022-04-23: 1 mg via ORAL
  Filled 2022-04-23: qty 1

## 2022-04-23 MED ORDER — LORAZEPAM 2 MG/ML IJ SOLN: 1.0000 mg | Freq: Once | INTRAMUSCULAR | Status: DC

## 2022-04-23 MED ORDER — OLANZAPINE 5 MG PO TBDP
5.0000 mg | ORAL_TABLET | Freq: Three times a day (TID) | ORAL | Status: DC | PRN
  Administered 2022-04-23 – 2022-04-29 (×14): 5 mg via ORAL
  Filled 2022-04-23 (×14): qty 1

## 2022-04-23 NOTE — Progress Notes (Signed)
[facility] MD Progress Note  04/23/2022 11:26 AM [name]  MRN:  [id number]  Subjective:   [name] is a 32 year old female with a psychiatric history of Schizoaffective disorder bipolar type, Anxiety, Autism spectrum disorder, PTSD, Borderline personality disorder, who was admitted to this psychiatric unit for evaluation and treatment after suicide ideation and attempt via cutting self.   Yesterday the psychiatry team made the following recommendations: -Continue vraylar 3 mg qd for schizoaffective do -continue lexapro 10 mg qd for gad, ptsd -continue prazosin 1 mg qhs for ptsd nightmares -continue lyrica 100 mg tid for fibromyalgia (outpt med) -continue metoprolol ER 12.5 mg qd for tachycarida    On assessment today, the pt reports that their mood is very down depressed and sad.  She is tearful, laying in bed.  Per nursing, patient has active suicidal thoughts and has been scratching herself.  One-to-one sitter started.  Patient reports that she is very anxious and overwhelmed.  There seems to the patient refused morning medications.  She did accept Zyprexa 5 mg for anxiety and psychosis this morning. Sleep is poor. Appetite is low. Concentration is at baseline. Energy level is low. Patient reports having active suicidal thoughts.  Patient reports having suicidal intent and plan, and patient scratched her forearm this morning.  Denies having any HI.  Denies having psychotic symptoms.   Denies having side effects to current psychiatric medications.   Discussed the following psychosocial stressors:  Patient states that she was somehow in contact with the parents of her late husband.  She reports they are not allowing her to see her child anymore.  She reports this is causing her to feel overwhelmed depressed and suicidal.  She reports "they are out to get me.  My husband he is making all this happened.  He is making all this bad stuff happened to me (he is deceased)."  She keeps  repeating "they are out to get me. They are out to get me".     Principal Problem: Schizoaffective disorder, bipolar type ([place]) Diagnosis: Principal Problem:   Schizoaffective disorder, bipolar type ([place]) Active Problems:   Borderline personality disorder ([place])   PTSD (post-traumatic stress disorder)   GAD (generalized anxiety disorder)  Total Time spent with patient: 20 minutes  Past Psychiatric History:  Previous Psych Diagnoses: Schizoaffective disorder bipolar type, Anxiety, Autism spectrum disorder, PTSD, Borderline personality disorder Prior inpatient treatment: multiple Current/prior outpatient treatment: [facility] Prior rehab hx: denies multiple Psychotherapy hx: [facility] History of suicide: 20-30 times by overdosing on pills, cutting or drinking 409 disinfectant History of homicide or aggression: denies Psychiatric medication history:  Previously trialed Tegretol, Haldol, Clozaril, Depakote, Invega, Lamictal, Seroquel, asenapine, abilify, vraylar, Lexapro, Zoloft  Neuromodulation history: denies    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 fragile X syndrome    Chronic constipation    Depression    Drug-seeking behavior    Essential tremor    Headache    Overdose of acetaminophen 07/2017   and other meds   Personality disorder ([place])    Schizo-affective psychosis ([place])    Schizoaffective disorder, bipolar type ([place])    Seizures ([place])    Last seizure December 2017   Sleep apnea     Past Surgical History:  Procedure Laterality Date   MOUTH SURGERY  2009 or 2010  Family History:  Family History  Problem Relation Age of Onset   Mental illness Father    Asthma Father    PDD Brother    Seizures Brother    Family Psychiatric  History: See H&P Social History:  Social History   Substance and Sexual Activity  Alcohol Use No   Alcohol/week: 1.0  standard drink of alcohol   Types: 1 Standard drinks or equivalent per week   Comment: denies at this time     Social History   Substance and Sexual Activity  Drug Use No   Comment: History of cocaine use at age 45 for 4 months    Social History   Socioeconomic History   Marital status: Widowed    Spouse name: Not on file   Number of children: 0   Years of education: Not on file   Highest education level: Not on file  Occupational History   Occupation: disability  Tobacco Use   Smoking status: Former    Packs/day: 0    Types: Cigarettes   Smokeless tobacco: Never   Tobacco comments:    Smoked for 2  years age [age]  Vaping Use   Vaping Use: Never used  Substance and Sexual Activity   Alcohol use: No    Alcohol/week: 1.0 standard drink of alcohol    Types: 1 Standard drinks or equivalent per week    Comment: denies at this time   Drug use: No    Comment: History of cocaine use at age 36 for 4 months   Sexual activity: Not Currently    Birth control/protection: None  Other Topics Concern   Not on file  Social History Narrative   Marital status: Widowed      Children: daughter      Lives: with boyfriend, in two story home      Employment:  Disability      Tobacco: quit smoking; smoked for two years.      Alcohol ;none      Drugs: none   Has not traveled outside of the country.   Right handed         Social Determinants of Health   Financial Resource Strain: Not on file  Food Insecurity: No Food Insecurity (04/19/2022)   Hunger Vital Sign    Worried About Running Out of Food in the Last Year: Never true    Ran Out of Food in the Last Year: Never true  Transportation Needs: Unmet Transportation Needs (04/19/2022)   PRAPARE - Hydrologist (Medical): Yes    Lack of Transportation (Non-Medical): Yes  Physical Activity: Not on file  Stress: Not on file  Social Connections: Not on file   Additional Social History:                           Current Medications: Current Facility-Administered Medications  Medication Dose Route Frequency Provider Last Rate Last Admin   acetaminophen (TYLENOL) tablet 650 mg  650 mg Oral Q6H PRN Motley-Mangrum, Jadeka A, PMHNP       albuterol (VENTOLIN HFA) 108 (90 Base) MCG/ACT inhaler 2 puff  2 puff Inhalation Q6H PRN Motley-Mangrum, Jadeka A, PMHNP   2 puff at 04/21/22 1238   alum & mag hydroxide-simeth (MAALOX/MYLANTA) 200-200-20 MG/5ML suspension 30 mL  30 mL Oral Q4H PRN Motley-Mangrum, Jadeka A, PMHNP       cariprazine (VRAYLAR) capsule 3 mg  3 mg Oral Daily Motley-Mangrum,  Al Pimple, PMHNP   3 mg at 04/22/22 0732   escitalopram (LEXAPRO) tablet 10 mg  10 mg Oral Daily Motley-Mangrum, Jadeka A, PMHNP   10 mg at 04/22/22 0732   hydrOXYzine (ATARAX) tablet 25 mg  25 mg Oral TID PRN Motley-Mangrum, Jadeka A, PMHNP   25 mg at 04/22/22 1019   magnesium hydroxide (MILK OF MAGNESIA) suspension 30 mL  30 mL Oral Daily PRN Motley-Mangrum, Jadeka A, PMHNP       melatonin tablet 3 mg  3 mg Oral QHS Motley-Mangrum, Jadeka A, PMHNP   3 mg at 04/21/22 2106   metoprolol succinate (TOPROL-XL) 24 hr tablet 12.5 mg  12.5 mg Oral Daily Motley-Mangrum, Jadeka A, PMHNP   12.5 mg at 04/22/22 0732   mometasone-formoterol (DULERA) 200-5 MCG/ACT inhaler 2 puff  2 puff Inhalation BID Lindell Spar I, NP   2 puff at 04/22/22 0730   OLANZapine (ZYPREXA) injection 5 mg  5 mg Intramuscular TID PRN Dayzee Trower, Ovid Curd, MD       OLANZapine zydis (ZYPREXA) disintegrating tablet 5 mg  5 mg Oral TID PRN Leane Loring, Ovid Curd, MD   5 mg at 04/23/22 0850   pantoprazole (PROTONIX) EC tablet 40 mg  40 mg Oral Daily Motley-Mangrum, Jadeka A, PMHNP   40 mg at 04/22/22 0732   prazosin (MINIPRESS) capsule 1 mg  1 mg Oral QHS Motley-Mangrum, Jadeka A, PMHNP   1 mg at 04/21/22 2106   pregabalin (LYRICA) capsule 100 mg  100 mg Oral TID Motley-Mangrum, Jadeka A, PMHNP   100 mg at 04/22/22 1611   traZODone (DESYREL) tablet 50 mg  50  mg Oral QHS PRN Motley-Mangrum, Jadeka A, PMHNP        Lab Results: No results found for this or any previous visit (from the past 48 hour(s)).  Blood Alcohol level:  Lab Results  Component Value Date   ETH <10 04/18/2022   ETH <10 [id number]    Metabolic Disorder Labs: Lab Results  Component Value Date   HGBA1C 5.2 04/20/2022   MPG 103 04/20/2022   MPG 96.8 10/07/2021   Lab Results  Component Value Date   PROLACTIN 66.2 (H) 03/02/2021   PROLACTIN 66.1 (H) 02/04/2020   Lab Results  Component Value Date   CHOL 191 04/20/2022   TRIG 163 (H) 04/20/2022   HDL 52 04/20/2022   CHOLHDL 3.7 04/20/2022   VLDL 33 04/20/2022   LDLCALC 106 (H) 04/20/2022   LDLCALC 102 (H) 12/10/2021    Physical Findings: AIMS:  , ,  ,  ,    CIWA:    COWS:     Musculoskeletal: Strength & Muscle Tone: Lying in bed Gait & Station: Lying in bed Patient leans: Lying in bed  Psychiatric Specialty Exam:  Presentation  General Appearance:  Disheveled  Eye Contact: Poor  Speech: Slow  Speech Volume: Decreased  Handedness: Right   Mood and Affect  Mood: Anxious; Depressed; Worthless  Affect: Depressed   Thought Process  Thought Processes: Linear  Descriptions of Associations:Intact  Orientation:Full (Time, Place and Person)  Thought Content:Paranoid Ideation  History of Schizophrenia/Schizoaffective disorder:Yes  Duration of Psychotic Symptoms:Greater than six months  Hallucinations:Hallucinations: None  Ideas of Reference:Paranoia; Delusions  Suicidal Thoughts:Suicidal Thoughts: Yes, Passive SI Active Intent and/or Plan: With Intent; With Plan  Homicidal Thoughts:Homicidal Thoughts: No   Sensorium  Memory: Immediate Good; Recent Good; Remote Good  Judgment: Impaired  Insight: Lacking   Executive Functions  Concentration: Fair  Attention Span: Fair   Psychomotor  Activity  Psychomotor Activity: Psychomotor Activity: Normal   Assets   Assets: Communication Skills; Resilience; Social Support; Physical Health   Sleep  Sleep: Sleep: Poor    Physical Exam: Physical Exam Vitals reviewed.  Pulmonary:     Effort: Pulmonary effort is normal.  Neurological:     Mental Status: She is alert.    Review of Systems  Constitutional:  Negative for chills and fever.  Cardiovascular:  Negative for chest pain and palpitations.  Neurological:  Negative for dizziness, tingling, tremors and headaches.  Psychiatric/Behavioral:  Positive for depression and suicidal ideas. The patient is nervous/anxious and has insomnia.    Blood pressure 129/62, pulse 97, temperature 98.8 F (37.1 C), temperature source Oral, resp. rate 20, height 5\' 4"  (1.626 m), weight 90.7 kg, SpO2 99 %. Body mass index is 34.33 kg/m.   Treatment Plan Summary:  ASSESSMENT:   Diagnoses / Active Problems: Schizoaffective d/o BP type GAD PTSD Borderline PD H/o autism?    PLAN: Safety and Monitoring:             --  Voluntary admission to inpatient psychiatric unit for safety, stabilization and treatment             -- Daily contact with patient to assess and evaluate symptoms and progress in treatment             -- Patient's case to be discussed in multi-disciplinary team meeting             -- Observation Level : q15 minute checks             -- Vital signs:  q12 hours             -- Precautions: suicide, elopement, and assault   2. Psychiatric Diagnoses and Treatment:               -Continue vraylar 3 mg qd for schizoaffective do -continue lexapro 10 mg qd for gad, ptsd -continue prazosin 1 mg qhs for ptsd nightmares -continue lyrica 100 mg tid for fibromyalgia (outpt med) -continue metoprolol ER 12.5 mg qd for tachycarida   -Change agitation protocol from Haldol to Zyprexa 5 mg tid prn oral and IM   -Start one-to-one sitter for active suicidal thoughts   --  The risks/benefits/side-effects/alternatives to this medication were discussed  in detail with the patient and time was given for questions. The patient consents to medication trial.                -- Metabolic profile and EKG monitoring obtained while on an atypical antipsychotic (BMI: Lipid Panel: HbgA1c: QTc:)              -- Encouraged patient to participate in unit milieu and in scheduled group therapies                          3. Medical Issues Being Addressed:          Metoprolol for tachycardia Lyrica for fibromyalgia Pantoprazole for reflux   4. Discharge Planning:              -- Social work and case management to assist with discharge planning and identification of hospital follow-up needs prior to discharge             -- Estimated LOS: 3-4 days             -- Discharge Concerns: Need to establish a safety plan; Medication  compliance and effectiveness             -- Discharge Goals: Return home with outpatient referrals for mental health follow-up including medication management/psychotherapy      Christoper Allegra, MD 04/23/2022, 11:26 AM  Total Time Spent in Direct Patient Care:  I personally spent 35 minutes on the unit in direct patient care. The direct patient care time included face-to-face time with the patient, reviewing the patient's chart, communicating with other professionals, and coordinating care. Greater than 50% of this time was spent in counseling or coordinating care with the patient regarding goals of hospitalization, psycho-education, and discharge planning needs.   Janine Limbo, MD Psychiatrist

## 2022-04-23 NOTE — Group Note (Signed)
Recreation Therapy Group Note   Group Topic:Stress Management  Group Date: 04/23/2022 Start Time: 0935 End Time: 1000 Facilitators: Jalen Oberry-McCall, LRT,CTRS Location: [address] Dayroom   Goal Area(s) Addresses:  Patient will actively participate in stress management techniques presented during session.  Patient will successfully identify benefit of practicing stress management post d/c.   Group Description: Guided Imagery. LRT provided education, instruction, and demonstration on practice of visualization via guided imagery. Patient was asked to participate in the technique introduced during session. LRT debriefed including topics of mindfulness, stress management and specific scenarios each patient could use these techniques. Patients were given suggestions of ways to access scripts post d/c and encouraged to explore Youtube and other apps available on smartphones, tablets, and computers.   Affect/Mood: N/A   Participation Level: Did not attend    Clinical Observations/Individualized Feedback:     Plan: Continue to engage patient in RT group sessions 2-3x/week.   Nowell Sites-McCall, LRT,CTRS  04/23/2022 11:37 AM

## 2022-04-23 NOTE — Progress Notes (Signed)
1:1 Note 0900  Patient had scratched her lower L arm with her fingernails.  Superficial cut marks, no bleeding.  MD/staff informed of patient's behavior.  Patient has refused to eat breakfast this morning.  Patient was offered cereal, crackers, etc but she continued to refused her morning meds.  Patient stated her family was trying to poison her, kill her.  RN and MHTs continued to assure patient that she is safe at [facility]  No one can get into [facility] to hurt her.  Stated her family did not want her, family took away her child, and now family wants to hurt her.   Respirations even and unlabored.  No signs/symptoms of pain/distress noted on patient's face/body movements.  1:1 will continue for patient's safety.

## 2022-04-23 NOTE — Progress Notes (Signed)
1430  Note 1:1    Patient continues to have 1:1 for safety.  Patient has refused medications today.  Patient did take zyprexa this morning after MD encouragement.  Patient refused to eat breakfast, lunch, snacks.  Staff has continued to encourage patient to eat, drink fluids today.   Patient has continued to lay in her bed.  Patient mumbles when asked questions by nurse.  Respirations even and unlabored.  No signs/symptoms of pain/distress noted.  1:1 continues for safety per MD orders.

## 2022-04-23 NOTE — [facility] Group Notes (Signed)
Spiritual care group on grief and loss facilitated by Chaplain Katy Gianny Sabino, [facility] and Colleen Pesci, counseling intern.  Group Goal: Support / Education around grief and loss  Members engage in facilitated group support and psycho-social education.  Group Description:  Following introductions and group rules, group members engaged in facilitated group dialogue and support around topic of loss, with particular support around experiences of loss in their lives. Group Identified types of loss (relationships / self / things) and identified patterns, circumstances, and changes that precipitate losses. Reflected on thoughts / feelings around loss, normalized grief responses, and recognized variety in grief experience. Group encouraged individual reflection on safe space and on the coping skills that they are already utilizing.  Group drew on Adlerian / Rogerian and narrative framework  Patient Progress: Did not attend.  

## 2022-04-23 NOTE — Progress Notes (Signed)
1:1 Note 1900   Patient continues to lay in bed.  Patient did not eat any dinner.  Patient has been drinking gatorade throughout the day.  Staff has been encouraging patient throughout the day to [name].  Patient continues  to shake her head yes that she is having SI thoughts.  Patient has refused sandwiches, salads, crackers, etc.  Respirations even and unlabored.  No signs/symptoms of pain/distress noted on patient's face/body movements.  1:1 continues for patient's safety per MD orders.

## 2022-04-23 NOTE — Progress Notes (Signed)
1:1 Note 1014  Patient continues to lay in bed.  Has not eaten breakfast.  Patient has drank fluids this morning. Respirations even and unlabored.  No signs/symptoms of pain/distress noted on patient's face/body movements.  1:1 continues for patient's safety.

## 2022-04-23 NOTE — Progress Notes (Signed)
   04/23/22 1100  Spiritual Encounters  Type of Visit Initial  Care provided to: Patient  Referral source Nurse (RN/NT/LPN)  Reason for visit Urgent spiritual support  OnCall Visit No  Spiritual Framework  Presenting Themes Significant life change;Impactful experiences and emotions  Community/Connection Limited  Patient Stress Factors Major life changes;Family relationships  Family Stress Factors Family relationships  Interventions  Spiritual Care Interventions Made Established relationship of care and support;Compassionate presence;Reflective listening;Normalization of emotions  Intervention Outcomes  Outcomes Reduced anxiety  Spiritual Care Plan  Follow up plan  requested Bible    Chaplain responded to nurse consult. Chaplain provided compassionate presence and reflective listening as patient expressed emotions regarding health challenges and life stressors. Patient identified family relationships as challenging and stressful. Chaplain provided options for coping with big emotions and stressors. Patient agreed to plan of care in doing something for herself today to provide the support she needs for her emotions. Patient uses faith and prayer as a source of strength.  Patient requested Bible and Chaplain services will provide one. Patient appreciated Chaplain visit and will ask nurse to page Chaplain for further support during stay.

## 2022-04-23 NOTE — Progress Notes (Signed)
1:1 Note:  Patient awake sitting at the end of bed. Patient reports anxiety level has not decreased from prn hydroxyzine, NP notified. Patient refusing scheduled melatonin at this time. RN encouraged patient to eat. Patient continues to refuse meal/snacks, however will drink gatorade. Patient reports decrease in pain level after scheduled once lyrica. No acute distress noted. 1:1 maintained for patient's safety per provider orders.

## 2022-04-23 NOTE — Progress Notes (Signed)
1:1 Note [id number]   Patient's self inventory sheet, patient sleeps good, no sleep medication.  Poor appetite, low energy level, poor concentration.  Rated depression, hopeless and anxiety 10 plus.  Has a lot of SI thoughts.  Sales promotion account executive for safety.  Denied physical pain.  Does not have goal for today.  Patient stated she wants her to have her alone.  Plans to take a shower.  Mumblings.  No discharge plans.  Chaplain has talked to patient this morning. 1:1 continues for safety per MD order.

## 2022-04-23 NOTE — Progress Notes (Signed)
1:1 Note 1700  Patient has been sitting or laying on side of bed most of the day.  Patient has refused most of her medications today.  Patient did agree to take zyprexa prn before dinner.  Also patient took toprol.   Patient did say that she has SI and HI, could not contract for safety.  No plans at this time.  Denied A/V hallucinations.  Patient mumbles when asked questions.  Then patient started to shake her head for yes and no answers.  Respirations even and unlabored.  No signs/symptoms of pain/distress noted on patient's face/body movements.   1:1 continues for patient's safety per MD orders.

## 2022-04-23 NOTE — Progress Notes (Addendum)
MHT informed RN patient scratched herself with a pencil she was writing with in her room. MHT removed pencil from patient possession and patient began to scratch herself on her left inner forearm. NP notified. RN spoke to patient and patient reported, "I'm feeling scared" and "I want to take my thoughts out on myself." Stress reduction techniques were discussed with the patient and was able to be verbally redirected. No acute distress noted at this time, patient respirations even and unlabored. Once order for 1 mg of lorazepam given, per provider orders for patient anxiety, restlessness. Patient MHT reported patient ate crackers. Patient offered additional snacks and patient accepted, patient ate 2 chocolate ice cream cups and popcorn. 1:1 maintained for patient safety.

## 2022-04-24 DIAGNOSIS — F319 Bipolar disorder, unspecified: Secondary | ICD-10-CM | POA: Diagnosis not present

## 2022-04-24 MED ORDER — TRIPLE ANTIBIOTIC 3.5-400-5000 EX OINT
1.0000 | TOPICAL_OINTMENT | Freq: Every day | CUTANEOUS | Status: DC | PRN
  Administered 2022-04-24 – 2022-04-29 (×5): 1 via TOPICAL
  Filled 2022-04-24 (×8): qty 1

## 2022-04-24 NOTE — Group Note (Signed)
Recreation Therapy Group Note   Group Topic:Animal Assisted Therapy   Group Date: 04/24/2022 Start Time: [id number] End Time: 1033 Facilitators: Dosha Broshears-McCall, LRT,CTRS Location: [address] Dayroom   Animal-Assisted Activity (AAA) Program Checklist/Progress Notes Patient Eligibility Criteria Checklist & Daily Group note for Rec Tx Intervention  AAA/T Program Assumption of Risk Form signed by Patient/ or Parent Legal Guardian Yes  Patient understands his/her participation is voluntary Yes   Affect/Mood: N/A   Participation Level: Did not attend    Clinical Observations/Individualized Feedback:     Plan: Continue to engage patient in RT group sessions 2-3x/week.   [name], LRT,CTRS 04/24/2022 1:45 PM

## 2022-04-24 NOTE — Progress Notes (Signed)
   04/24/22 0625  Vitals  BP 121/89  MAP (mmHg) 96  BP Location Left Arm  BP Method Automatic  Patient Position (if appropriate) Standing  Pulse Rate (!) 126  Pulse Rate Source Monitor  Oxygen Therapy  SpO2 99 %   Manual retake of pulse 103. Patient denies associated cardiac symptoms at this time. No acute distress noted. Dr. Mamie Levers notified. Patient reports no pain "other than having a sore throat".

## 2022-04-24 NOTE — Progress Notes (Signed)
1:1 Note [id number]  Patient continues to sleep in her bed off/on most of the afternoon.  Respirations even and unlabored.  No signs/symptoms of pain/distress noted on patient's face/body movements.  1:1 continues for patient safety per MD order.

## 2022-04-24 NOTE — Progress Notes (Signed)
   04/24/22 0558  15 Minute Checks  Location Bedroom  Visual Appearance Fearful/Worried  Behavior Anxious  Sleep (Behavioral Health Patients Only)  Calculate sleep? (Click Yes once per 24 hr at 0600 safety check) Yes  Documented sleep last 24 hours 5.25

## 2022-04-24 NOTE — Progress Notes (Signed)
   04/23/22 2005  Psych Admission Type (Psych Patients Only)  Admission Status Voluntary  Psychosocial Assessment  Patient Complaints Anxiety;Depression;Hopelessness;Isolation;Self-harm thoughts;Self-harm behaviors;Sadness;Worrying  Eye Contact Avoids  Facial Expression Anxious;Sad;Worried  Affect Anxious;Depressed;Sad  Speech Soft;Other (Comment) (mumbles)  Interaction Forwards little;Isolative  Motor Activity Fidgety  Appearance/Hygiene Disheveled  Behavior Characteristics Anxious;Guarded  Mood Depressed;Anxious;Suspicious;Sad  Aggressive Behavior  Targets Self  Type of Behavior Other (Comment) (Scratches to inner left forearm.)  Effect Self-harm  Thought Process  Coherency WDL  Content Blaming others;Blaming self  Delusions Paranoid  Perception WDL  Hallucination None reported or observed  Judgment Impaired  Confusion None  Danger to Self  Current suicidal ideation? Denies  Self-Injurious Behavior Actively performing acts reported or observed  Agreement Not to Harm Self No  Description of Agreement Patient will not verbally contract at this time. 1:1 maintained for patient safety.  Danger to Others  Danger to Others None reported or observed  Danger to Others Abnormal  Harmful Behavior to others No threats or harm toward other people  Destructive Behavior No threats or harm toward property   Patient alert and oriented. Presenting with an anxious, depressed affect and suspicious mood. Patient denies SI, HI, AVH at this time and reports anxiety and depression 10/10. Patient states she is "worried and scared" because she feels she will be poisoned. PRN hydroxyzine administered, per provider orders. Patient reports pain 10/10 "all over" related to fibromyalgia. Once ordered lyrica administered, per provider orders. Scheduled dulera administered to patient, per provider orders. Support and encouragement provided. Patient encouraged to eat meals/snacks. Patient refused at this  time. Patient accepted gatorade from Therapist, sports. Routine safety checks conducted every 15 minutes. Patient refused to verbally contract for safety related to self-harm. 1:1 maintained for patient safety, per provider orders, MHT at bedside. No acute distress noted, q15 minute safety checks in place. Patient remains safe on the unit at this time.

## 2022-04-24 NOTE — Progress Notes (Signed)
Sitter notified this RN that patient was hitting her head against the wall and also took off the pillow case and tried to hold it around her neck. Patient states, "the voices are telling me to kill myself." This Rn and Vaughan Basta, Therapist, sports at bedside provided encouragement, reorientation, and supportive listening. Patient agreed to take PRN medications. Pillow case removed from room. Patient positioned lower on the bed away from the wall. Sitter at bedside

## 2022-04-24 NOTE — Progress Notes (Signed)
1:1 Note:  Patient asleep in room lying on her right side snoring. Patient breathing is regular, even and unlabored. No acute distress noted. 1:1 maintained for patient safety, per provider orders. 1:1 MHT at bedside. Patient remains safe on the unit.

## 2022-04-24 NOTE — Progress Notes (Signed)
1:1 Note 0815   Patient laying in bed.  Continues to talk very low, mumbling.  Patient stated she has been seeing flowers, hears mumblings.  SI to cut her throat with her fingernails, cannot contract for safety.  Denied HI.  Ate 15% of her breakfast.  Took all her medications this morning.  Has scratch marks on hand this morning.   Top of L hand is red where patient scratched herself with her fingernails.  Neosporin was applied to scratch marks on L arm and hand this morning.   Respirations even and unlabored.  No signs/symptoms of pain/distress noted on patient's face/body movement. 1:1 continues per MD order for safety.

## 2022-04-24 NOTE — Progress Notes (Signed)
1:1 Note:  Patient in room lying in bed, alert and talking to this RN and 1:1 MHT. Patient states once lorazepam 1 mg was effective last night and brought her anxiety to "0/10". Patient breathing is regular, even and unlabored. No acute distress noted. 1:1 maintained for patient safety, per provider orders. 1:1 MHT at bedside. Patient remains safe on the unit.

## 2022-04-24 NOTE — Group Note (Unsigned)
Date:  04/24/2022 Time:  4:16 PM  Group Topic/Focus:  Goals Group:   The focus of this group is to help patients establish daily goals to achieve during treatment and discuss how the patient can incorporate goal setting into their daily lives to aide in recovery. Orientation:   The focus of this group is to educate the patient on the purpose and policies of crisis stabilization and provide a format to answer questions about their admission.  The group details unit policies and expectations of patients while admitted.     Participation Level:  {B[facility] PARTICIPATION WO:[id number]  Participation Quality:  {B[facility] PARTICIPATION QUALITY:22265}  Affect:  {B[facility] AFFECT:22266}  Cognitive:  {[facility] COGNITIVE:22267}  Insight: {BHH Insight2:20797}  Engagement in Group:  {B[facility] ENGAGEMENT IN BP:[id number]  Modes of Intervention:  {BHH MODES OF INTERVENTION:22269}  Additional Comments:  ***  Garvin Fila 04/24/2022, 4:16 PM

## 2022-04-24 NOTE — Progress Notes (Signed)
1:1 Note [id number]   Patient has been sleeping off/on this morning.  Patient did take her 1200 lyrica.  Patient stated she will try to eat lunch later.  Respirations even and unlabored.  No signs/symptoms of pain/distress on  patient's face/body movements.  1:1 continues per MD order for patient safety.

## 2022-04-24 NOTE — Plan of Care (Signed)
Nurse discussed anxiety, depression and coping skills with patient.  

## 2022-04-24 NOTE — Progress Notes (Signed)
1:1 Note 1015    Patient continues to lay in bed.  Sleeping off/on.  Respirations even and unlabored.  No signs/symptoms of pain/distress noted on patient's face/body movements.  1:1 continues per MD order for safety.

## 2022-04-24 NOTE — Progress Notes (Signed)
1:1 Note 1415  Patient continues to sleep off/on this afternoon.  1:1 continues for safety per MD orders.  Respirations even and unlabored.  No signs/symptoms of pain/distress noted on patient's face/body movements.

## 2022-04-24 NOTE — B[facility] Group Notes (Signed)
The focus of this group is to help patients review their daily goal of treatment and discuss progress on daily workbooks. Pt was attentive and appropriate during tonight's wrap up group. Pt was able to share that today's goal was to attend group. Pt shared that she was able to attend wrap up group. Overall good day.

## 2022-04-24 NOTE — Progress Notes (Signed)
[facility] MD Progress Note  04/24/2022 11:53 AM [name]  MRN:  [id number]  Subjective:   [name] is a 32 year old female with a psychiatric history of Schizoaffective disorder bipolar type, Anxiety, Autism spectrum disorder, PTSD, Borderline personality disorder, who was admitted to this psychiatric unit for evaluation and treatment after suicide ideation and attempt via cutting self.   Since yesterday, the patient has been on a one-to-one sitter for active suicidal thoughts and trying to harm herself on the unit.  She attempted to scratch herself with a pencil yesterday as well.  She continues to be on a one-to-one sitter at this time.  She reports continuing to have active suicidal thoughts with a plan to cut herself.  She refused her scheduled medications yesterday.  She is encouraged to take these medications this morning.  On chart review, she did take her scheduled medications this morning.  She reports her mood is still overwhelmed down and depressed.  Yesterday she stated that she had an interaction/phone call over the weekend and with the the parents of her late husband, who have custody of the patient's child, which caused her mood to acutely heightened and suicidal thoughts.   She reports that her sleep is okay with as needed Zyprexa and as needed Ativan yesterday.  She reports appetite is low.  Concentration is poor.  She denies having any HI.  She reports having auditory hallucinations but denies having any command auditory hallucinations to harm self or others. She she is not overtly paranoid, as she presented yesterday.  Denies having side effects to current psychiatric medications.      Principal Problem: Schizoaffective disorder, bipolar type ([place]) Diagnosis: Principal Problem:   Schizoaffective disorder, bipolar type ([place]) Active Problems:   Borderline personality disorder ([facility])   PTSD (post-traumatic stress disorder)   GAD (generalized anxiety disorder)  Total  Time spent with patient: 20 minutes  Past Psychiatric History:  Previous Psych Diagnoses: Schizoaffective disorder bipolar type, Anxiety, Autism spectrum disorder, PTSD, Borderline personality disorder Prior inpatient treatment: multiple Current/prior outpatient treatment: [facility] Prior rehab hx: denies multiple Psychotherapy hx: [facility] History of suicide: 20-30 times by overdosing on pills, cutting or drinking 409 disinfectant History of homicide or aggression: denies Psychiatric medication history:  Previously trialed Tegretol, Haldol, Clozaril, Depakote, Invega, Lamictal, Seroquel, asenapine, abilify, vraylar, Lexapro, Zoloft  Neuromodulation history: denies    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 fragile X syndrome    Chronic constipation    Depression    Drug-seeking behavior    Essential tremor    Headache    Overdose of acetaminophen 07/2017   and other meds   Personality disorder ([place])    Schizo-affective psychosis ([place])    Schizoaffective disorder, bipolar type ([place])    Seizures ([place])    Last seizure December 2017   Sleep apnea     Past Surgical History:  Procedure Laterality Date   MOUTH SURGERY  2009 or 2010   Family History:  Family History  Problem Relation Age of Onset   Mental illness Father    Asthma Father    PDD Brother    Seizures Brother    Family Psychiatric  History: See H&P Social History:  Social History   Substance and Sexual Activity  Alcohol Use No   Alcohol/week: 1.0 standard drink  of alcohol   Types: 1 Standard drinks or equivalent per week   Comment: denies at this time     Social History   Substance and Sexual Activity  Drug Use No   Comment: History of cocaine use at age 57 for 4 months    Social History   Socioeconomic History   Marital status: Widowed    Spouse name: Not on file   Number of children:  0   Years of education: Not on file   Highest education level: Not on file  Occupational History   Occupation: disability  Tobacco Use   Smoking status: Former    Packs/day: 0    Types: Cigarettes   Smokeless tobacco: Never   Tobacco comments:    Smoked for 2  years age [age]  Vaping Use   Vaping Use: Never used  Substance and Sexual Activity   Alcohol use: No    Alcohol/week: 1.0 standard drink of alcohol    Types: 1 Standard drinks or equivalent per week    Comment: denies at this time   Drug use: No    Comment: History of cocaine use at age 78 for 4 months   Sexual activity: Not Currently    Birth control/protection: None  Other Topics Concern   Not on file  Social History Narrative   Marital status: Widowed      Children: daughter      Lives: with boyfriend, in two story home      Employment:  Disability      Tobacco: quit smoking; smoked for two years.      Alcohol ;none      Drugs: none   Has not traveled outside of the country.   Right handed         Social Determinants of Health   Financial Resource Strain: Not on file  Food Insecurity: No Food Insecurity (04/19/2022)   Hunger Vital Sign    Worried About Running Out of Food in the Last Year: Never true    Ran Out of Food in the Last Year: Never true  Transportation Needs: Unmet Transportation Needs (04/19/2022)   PRAPARE - Hydrologist (Medical): Yes    Lack of Transportation (Non-Medical): Yes  Physical Activity: Not on file  Stress: Not on file  Social Connections: Not on file   Additional Social History:                          Current Medications: Current Facility-Administered Medications  Medication Dose Route Frequency Provider Last Rate Last Admin   acetaminophen (TYLENOL) tablet 650 mg  650 mg Oral Q6H PRN Motley-Mangrum, Jadeka A, PMHNP       albuterol (VENTOLIN HFA) 108 (90 Base) MCG/ACT inhaler 2 puff  2 puff Inhalation Q6H PRN Motley-Mangrum, Jadeka  A, PMHNP   2 puff at 04/21/22 1238   alum & mag hydroxide-simeth (MAALOX/MYLANTA) 200-200-20 MG/5ML suspension 30 mL  30 mL Oral Q4H PRN Motley-Mangrum, Jadeka A, PMHNP       cariprazine (VRAYLAR) capsule 3 mg  3 mg Oral Daily Motley-Mangrum, Jadeka A, PMHNP   3 mg at 04/24/22 0812   escitalopram (LEXAPRO) tablet 10 mg  10 mg Oral Daily Motley-Mangrum, Jadeka A, PMHNP   10 mg at 04/24/22 0811   hydrOXYzine (ATARAX) tablet 25 mg  25 mg Oral TID PRN Motley-Mangrum, Jadeka A, PMHNP   25 mg at 04/24/22 0617   magnesium hydroxide (  MILK OF MAGNESIA) suspension 30 mL  30 mL Oral Daily PRN Motley-Mangrum, Jadeka A, PMHNP       melatonin tablet 3 mg  3 mg Oral QHS Motley-Mangrum, Jadeka A, PMHNP   3 mg at 04/21/22 2106   metoprolol succinate (TOPROL-XL) 24 hr tablet 12.5 mg  12.5 mg Oral Daily Motley-Mangrum, Jadeka A, PMHNP   12.5 mg at 04/24/22 0811   mometasone-formoterol (DULERA) 200-5 MCG/ACT inhaler 2 puff  2 puff Inhalation BID Lindell Spar I, NP   2 puff at 04/24/22 0812   neomycin-bacitracin-polymyxin [id number] OINT 1 Application  1 Application Topical Daily PRN Onuoha, Chinwendu V, NP   1 Application at [id number]   OLANZapine (ZYPREXA) injection 5 mg  5 mg Intramuscular TID PRN Brylee Berk, Ovid Curd, MD       OLANZapine zydis (ZYPREXA) disintegrating tablet 5 mg  5 mg Oral TID PRN Janine Limbo, MD   5 mg at 04/23/22 1735   pantoprazole (PROTONIX) EC tablet 40 mg  40 mg Oral Daily Motley-Mangrum, Jadeka A, PMHNP   40 mg at 04/24/22 0812   prazosin (MINIPRESS) capsule 1 mg  1 mg Oral QHS Motley-Mangrum, Jadeka A, PMHNP   1 mg at 04/21/22 2106   pregabalin (LYRICA) capsule 100 mg  100 mg Oral TID Motley-Mangrum, Jadeka A, PMHNP   100 mg at 04/24/22 0814   traZODone (DESYREL) tablet 50 mg  50 mg Oral QHS PRN Motley-Mangrum, Jadeka A, PMHNP        Lab Results: No results found for this or any previous visit (from the past 48 hour(s)).  Blood Alcohol level:  Lab Results  Component Value  Date   ETH <10 04/18/2022   ETH <10 [id number]    Metabolic Disorder Labs: Lab Results  Component Value Date   HGBA1C 5.2 04/20/2022   MPG 103 04/20/2022   MPG 96.8 10/07/2021   Lab Results  Component Value Date   PROLACTIN 66.2 (H) 03/02/2021   PROLACTIN 66.1 (H) 02/04/2020   Lab Results  Component Value Date   CHOL 191 04/20/2022   TRIG 163 (H) 04/20/2022   HDL 52 04/20/2022   CHOLHDL 3.7 04/20/2022   VLDL 33 04/20/2022   LDLCALC 106 (H) 04/20/2022   LDLCALC 102 (H) 12/10/2021    Physical Findings: AIMS:  , ,  ,  ,    CIWA:    COWS:     Musculoskeletal: Strength & Muscle Tone: Lying in bed Gait & Station: Lying in bed Patient leans: Lying in bed  Psychiatric Specialty Exam:  Presentation  General Appearance:  Disheveled  Eye Contact: Poor  Speech: Slow  Speech Volume: Decreased  Handedness: Right   Mood and Affect  Mood: Anxious; Depressed; Worthless  Affect: Depressed   Thought Process  Thought Processes: Linear  Descriptions of Associations:Intact  Orientation:Full (Time, Place and Person)  Thought Content:Paranoid Ideation  History of Schizophrenia/Schizoaffective disorder:Yes  Duration of Psychotic Symptoms:Greater than six months  Hallucinations:Hallucinations: None  Ideas of Reference:Paranoia; Delusions  Suicidal Thoughts:Suicidal Thoughts: Yes, active, with plan to cut self.  On one-to-one sitter.  Homicidal Thoughts:Homicidal Thoughts: No   Sensorium  Memory: Immediate Good; Recent Good; Remote Good  Judgment: Impaired  Insight: Lacking   Executive Functions  Concentration: Fair  Attention Span: Fair   Psychomotor Activity  Psychomotor Activity: Psychomotor Activity: Normal   Assets  Assets: Communication Skills; Resilience; Social Support; Physical Health   Sleep  Sleep: Sleep: Poor    Physical Exam: Physical Exam Vitals reviewed.  Pulmonary:  Effort: Pulmonary effort is  normal.  Neurological:     Mental Status: She is alert.    Review of Systems  Constitutional:  Negative for chills and fever.  Cardiovascular:  Negative for chest pain and palpitations.  Neurological:  Negative for dizziness, tingling, tremors and headaches.  Psychiatric/Behavioral:  Positive for depression and suicidal ideas. The patient is nervous/anxious and has insomnia.    Blood pressure 121/89, pulse (!) 103, temperature 98.8 F (37.1 C), temperature source Oral, resp. rate 20, height 5\' 4"  (1.626 m), weight 90.7 kg, SpO2 100 %. Body mass index is 34.33 kg/m.   Treatment Plan Summary:  ASSESSMENT:   Diagnoses / Active Problems: Schizoaffective d/o BP type GAD PTSD Borderline PD H/o autism?    PLAN: Safety and Monitoring:             --  Voluntary admission to inpatient psychiatric unit for safety, stabilization and treatment             -- Daily contact with patient to assess and evaluate symptoms and progress in treatment             -- Patient's case to be discussed in multi-disciplinary team meeting             -- Observation Level : q15 minute checks             -- Vital signs:  q12 hours             -- Precautions: suicide, elopement, and assault   2. Psychiatric Diagnoses and Treatment:               -Continue vraylar 3 mg qd for schizoaffective do -continue lexapro 10 mg qd for gad, ptsd -continue prazosin 1 mg qhs for ptsd nightmares -continue lyrica 100 mg tid for fibromyalgia (outpt med) -continue metoprolol ER 12.5 mg qd for tachycarida   -Start one-to-one sitter for active suicidal thoughts   --  The risks/benefits/side-effects/alternatives to this medication were discussed in detail with the patient and time was given for questions. The patient consents to medication trial.                -- Metabolic profile and EKG monitoring obtained while on an atypical antipsychotic (BMI: Lipid Panel: HbgA1c: QTc:)              -- Encouraged patient to  participate in unit milieu and in scheduled group therapies                          3. Medical Issues Being Addressed:          Metoprolol for tachycardia Lyrica for fibromyalgia Pantoprazole for reflux   4. Discharge Planning:              -- Social work and case management to assist with discharge planning and identification of hospital follow-up needs prior to discharge             -- Estimated LOS: 3-4 days             -- Discharge Concerns: Need to establish a safety plan; Medication compliance and effectiveness             -- Discharge Goals: Return home with outpatient referrals for mental health follow-up including medication management/psychotherapy      Christoper Allegra, MD 04/24/2022, 11:53 AM  Total Time Spent in Direct Patient Care:  I personally spent 35 minutes  on the unit in direct patient care. The direct patient care time included face-to-face time with the patient, reviewing the patient's chart, communicating with other professionals, and coordinating care. Greater than 50% of this time was spent in counseling or coordinating care with the patient regarding goals of hospitalization, psycho-education, and discharge planning needs.   Janine Limbo, MD Psychiatrist

## 2022-04-25 ENCOUNTER — Encounter (HOSPITAL_COMMUNITY): Payer: Self-pay

## 2022-04-25 DIAGNOSIS — F319 Bipolar disorder, unspecified: Secondary | ICD-10-CM | POA: Diagnosis not present

## 2022-04-25 NOTE — BH IP Treatment Plan (Signed)
Interdisciplinary Treatment and Diagnostic Plan Update  04/25/2022 Time of Session: 8:30 am ( UPDATE) [name] MRN: [id number]  Principal Diagnosis: Schizoaffective disorder, bipolar type ([place])  Secondary Diagnoses: Principal Problem:   Schizoaffective disorder, bipolar type ([place]) Active Problems:   Borderline personality disorder ([place])   PTSD (post-traumatic stress disorder)   GAD (generalized anxiety disorder)   Current Medications:  Current Facility-Administered Medications  Medication Dose Route Frequency Provider Last Rate Last Admin   acetaminophen (TYLENOL) tablet 650 mg  650 mg Oral Q6H PRN Motley-Mangrum, Jadeka A, PMHNP       albuterol (VENTOLIN HFA) 108 (90 Base) MCG/ACT inhaler 2 puff  2 puff Inhalation Q6H PRN Motley-Mangrum, Jadeka A, PMHNP   2 puff at 04/21/22 1238   alum & mag hydroxide-simeth (MAALOX/MYLANTA) 200-200-20 MG/5ML suspension 30 mL  30 mL Oral Q4H PRN Motley-Mangrum, Jadeka A, PMHNP       cariprazine (VRAYLAR) capsule 3 mg  3 mg Oral Daily Motley-Mangrum, Jadeka A, PMHNP   3 mg at 04/25/22 0824   escitalopram (LEXAPRO) tablet 10 mg  10 mg Oral Daily Motley-Mangrum, Jadeka A, PMHNP   10 mg at 04/25/22 0823   hydrOXYzine (ATARAX) tablet 25 mg  25 mg Oral TID PRN Motley-Mangrum, Jadeka A, PMHNP   25 mg at 04/24/22 1912   magnesium hydroxide (MILK OF MAGNESIA) suspension 30 mL  30 mL Oral Daily PRN Motley-Mangrum, Jadeka A, PMHNP       melatonin tablet 3 mg  3 mg Oral QHS Motley-Mangrum, Jadeka A, PMHNP   3 mg at 04/24/22 2040   metoprolol succinate (TOPROL-XL) 24 hr tablet 12.5 mg  12.5 mg Oral Daily Motley-Mangrum, Jadeka A, PMHNP   12.5 mg at 04/24/22 0811   mometasone-formoterol (DULERA) 200-5 MCG/ACT inhaler 2 puff  2 puff Inhalation BID Lindell Spar I, NP   2 puff at 04/25/22 0824   neomycin-bacitracin-polymyxin [id number] OINT 1 Application  1 Application Topical Daily PRN [place], Chinwendu V, NP   1 Application at [id number]    OLANZapine (ZYPREXA) injection 5 mg  5 mg Intramuscular TID PRN Massengill, Ovid Curd, MD       OLANZapine zydis (ZYPREXA) disintegrating tablet 5 mg  5 mg Oral TID PRN Massengill, Ovid Curd, MD   5 mg at 04/24/22 2056   pantoprazole (PROTONIX) EC tablet 40 mg  40 mg Oral Daily Motley-Mangrum, Jadeka A, PMHNP   40 mg at 04/25/22 0824   prazosin (MINIPRESS) capsule 1 mg  1 mg Oral QHS Motley-Mangrum, Jadeka A, PMHNP   1 mg at 04/24/22 2040   pregabalin (LYRICA) capsule 100 mg  100 mg Oral TID Motley-Mangrum, Jadeka A, PMHNP   100 mg at 04/24/22 1712   traZODone (DESYREL) tablet 50 mg  50 mg Oral QHS PRN Motley-Mangrum, Jadeka A, PMHNP   50 mg at 04/24/22 2056   PTA Medications: Medications Prior to Admission  Medication Sig Dispense Refill Last Dose   albuterol (VENTOLIN HFA) 108 (90 Base) MCG/ACT inhaler Inhale 2 puffs into the lungs every 6 (six) hours as needed for wheezing or shortness of breath.      BREYNA 160-4.5 MCG/ACT inhaler Inhale into the lungs.      cyclobenzaprine (FLEXERIL) 10 MG tablet Take 10 mg by mouth at bedtime.      diclofenac Sodium (VOLTAREN) 1 % GEL Apply 2 g topically 4 (four) times daily.      escitalopram (LEXAPRO) 10 MG tablet Take 1 tablet (10 mg total) by mouth daily for 14 days. [place]  tablet 0    escitalopram (LEXAPRO) 10 MG tablet Take 10 mg by mouth daily.      hydrOXYzine (ATARAX) 25 MG tablet Take 25 mg twice daily by mouth as needed for anxiety. Take 100 mg by mouth for sleep as needed at bedtime. 20 tablet 0    melatonin 3 MG TABS tablet Take 3 mg by mouth at bedtime.      metoprolol succinate (TOPROL-XL) 25 MG 24 hr tablet Take 0.5 tablets (12.5 mg total) by mouth daily for 14 days. 7 tablet 0    metoprolol succinate (TOPROL-XL) 25 MG 24 hr tablet Take 12.5 mg by mouth daily.      pantoprazole (PROTONIX) 40 MG tablet Take 1 tablet (40 mg total) by mouth daily for 14 days. 14 tablet 0    pantoprazole (PROTONIX) 40 MG tablet Take 40 mg by mouth daily.      prazosin  (MINIPRESS) 1 MG capsule Take 1 capsule (1 mg total) by mouth at bedtime for 14 days. 14 capsule 0    prazosin (MINIPRESS) 1 MG capsule Take 1 mg by mouth at bedtime.      pregabalin (LYRICA) 100 MG capsule Take 100 mg by mouth 3 (three) times daily.      vitamin B-12 (CYANOCOBALAMIN) 100 MCG tablet Take 100 mcg by mouth daily.      VRAYLAR 3 MG capsule Take 3 mg by mouth daily.       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   Physician Treatment Plan for Primary Diagnosis: Schizoaffective disorder, bipolar type ([place]) Long Term Goal(s): Improvement in symptoms so as ready for discharge   Short Term Goals: Ability to identify changes in lifestyle to reduce recurrence of condition will improve Ability to verbalize feelings will improve Ability to disclose and discuss suicidal ideas Ability to demonstrate self-control will improve Ability to identify and develop effective coping behaviors will improve Ability to maintain clinical measurements within normal limits will improve Compliance with prescribed medications will improve Ability to identify triggers associated with substance abuse/mental health issues will improve  Medication Management: Evaluate patient's response, side effects, and tolerance of medication regimen.  Therapeutic Interventions: 1 to 1 sessions, Unit Group sessions and Medication administration.  Evaluation of Outcomes: Progressing  Physician Treatment Plan for Secondary Diagnosis: Principal Problem:   Schizoaffective disorder, bipolar type ([place]) Active Problems:   Borderline personality disorder ([place])   PTSD (post-traumatic stress disorder)   GAD (generalized anxiety disorder)  Long Term Goal(s): Improvement in symptoms so as ready for discharge   Short Term Goals: Ability to identify  changes in lifestyle to reduce recurrence of condition will improve Ability to verbalize feelings will improve Ability to disclose and discuss suicidal ideas Ability to demonstrate self-control will improve Ability to identify and develop effective coping behaviors will improve Ability to maintain clinical measurements within normal limits will improve Compliance with prescribed medications will improve Ability to identify triggers associated with substance abuse/mental health issues will improve     Medication Management: Evaluate patient's response, side effects, and tolerance of medication regimen.  Therapeutic Interventions: 1 to 1 sessions, Unit Group sessions and Medication administration.  Evaluation of Outcomes: Progressing   RN Treatment Plan for Primary Diagnosis: Schizoaffective disorder, bipolar type ([place]) Long Term Goal(s): Knowledge of disease and therapeutic regimen to maintain health will improve  Short Term Goals: Ability to remain  free from injury will improve, Ability to verbalize frustration and anger appropriately will improve, Ability to participate in decision making will improve, Ability to verbalize feelings will improve, Ability to identify and develop effective coping behaviors will improve, and Compliance with prescribed medications will improve  Medication Management: RN will administer medications as ordered by provider, will assess and evaluate patient's response and provide education to patient for prescribed medication. RN will report any adverse and/or side effects to prescribing provider.  Therapeutic Interventions: 1 on 1 counseling sessions, Psychoeducation, Medication administration, Evaluate responses to treatment, Monitor vital signs and CBGs as ordered, Perform/monitor CIWA, COWS, AIMS and Fall Risk screenings as ordered, Perform wound care treatments as ordered.  Evaluation of Outcomes: Progressing   LCSW Treatment Plan for Primary Diagnosis:  Schizoaffective disorder, bipolar type ([place]) Long Term Goal(s): Safe transition to appropriate next level of care at discharge, Engage patient in therapeutic group addressing interpersonal concerns.  Short Term Goals: Engage patient in aftercare planning with referrals and resources, Increase social support, Increase emotional regulation, Facilitate acceptance of mental health diagnosis and concerns, Identify triggers associated with mental health/substance abuse issues, and Increase skills for wellness and recovery  Therapeutic Interventions: Assess for all discharge needs, 1 to 1 time with Social worker, Explore available resources and support systems, Assess for adequacy in community support network, Educate family and significant other(s) on suicide prevention, Complete Psychosocial Assessment, Interpersonal group therapy.  Evaluation of Outcomes: Progressing  Progress in Treatment: Attending groups: No Participating in groups: No. Taking medication as prescribed: Yes. Toleration medication: Yes. Family/Significant other contact made: No, unable to make contact with CST [name]) [phone]  Patient understands diagnosis: Yes. Discussing patient identified problems/goals with staff: Yes. Medical problems stabilized or resolved: Yes. Denies suicidal/homicidal ideation: Yes. Issues/concerns per patient self-inventory: Yes. Other: Pt hearing voices that are telling her to kill herself along with trying to put a pillow case around her neck   New problem(s) identified: No, Describe:  None Known   New Short Term/Long Term Goal(s):medication stabilization, elimination of SI thoughts, development of comprehensive mental wellness plan.    Patient Goals:  Improvement to symptoms/Coping Skills   Discharge Plan or Barriers: Patient recently admitted. CSW will continue to follow and assess for appropriate referrals and possible discharge planning   Reason for Continuation of  Hospitalization: Anxiety Depression Mania Medication stabilization Suicidal ideation   Estimated Length of Stay: 3-7 Days   Last 3 [place] Suicide Severity Risk Score: [place] Admission (Current) from 04/19/2022 in [place] [facility] ED from 04/18/2022 in [facility] Emergency Department at [facility] ED from 04/14/2022 in [facility] Emergency Department at [place] No Risk Error: Question 6 not populated No Risk       Last PHQ 2/9 Scores:    03/10/2022   10:01 PM 03/10/2022    9:56 PM 02/10/2022    1:49 AM  Depression screen PHQ 2/9  Decreased Interest 1 1 2   Down, Depressed, Hopeless 1 1 2   PHQ - 2 Score 2 2 4   Altered sleeping 1 1 2   Tired, decreased energy 1 1 2   Change in appetite 1 0 2  Feeling bad or failure about yourself  1 1 2   Trouble concentrating 1 1 2   Moving slowly or fidgety/restless 1 1 1   Suicidal thoughts 1 1 1   PHQ-9 Score 9 8 16   Difficult doing work/chores Somewhat difficult  Very difficult    Scribe for  Treatment Team: Charlett Lango 04/25/2022 9:23 AM

## 2022-04-25 NOTE — Progress Notes (Addendum)
Primary nurse and this nurse spoke with patient about contracting for safety. Pt stated she liked someone with her and she preferred to stay with them. Pt educated that the sitters are there for her safety and not mainly for companionship.Patient educated if she needed a show of support or reassurance, she can approach any staff member at any time. If she wanted to talk or process with someone, she can also do so at anytime.   Pt felt reassured and was able to verbally contract for safety. Pt stated she didn't want to hurt herself. Pt agreed to trailing off 1:1 while she was awake. Providers paged and notified to trail patient off 1:1 before bed and observe behaviors. Pt skin reassessed by this nurse and MHT. Pt educated that nurse would continuously perform skin assessments to ensure she is not self harming. Pt agreed with plan.

## 2022-04-25 NOTE — Progress Notes (Signed)
Safety round complete. Patient located in bedroom, asleep, lying supine position. Q15 mins checks will be continued.   

## 2022-04-25 NOTE — Progress Notes (Signed)
[place] [facility] 1:1 Observation Documentation  For the first (8) hours following discontinuation of 1:1 precautions, a progress note entry by nursing staff should be documented at least every 2 hours, reflecting the patient's behavior, condition, mood, and conversation.  Use the progress notes for additional entries.  Time 1:1 discontinued:  04/25/2022 @ 2024  Patient's Behavior:  Anxious  Patient's Condition:  Stable   Patient's Conversation:  Pt was able to come up to the nurse station from the dayroom and verbalize she was hearing voices and would like to try PRN medications and some word search puzzles. She states she is still able to contract for safety but want meds to get rid of the voices. Patient compliant with medications and verbalized understanding treatment plan. Patient receptive and cooperative.    Azucena Cecil 04/25/2022, 8:28 PM

## 2022-04-25 NOTE — Plan of Care (Signed)
  Problem: Coping: Goal: Ability to verbalize frustrations and anger appropriately will improve Outcome: Not Progressing Goal: Ability to demonstrate self-control will improve Outcome: Not Progressing   Problem: Safety: Goal: Periods of time without injury will increase Outcome: Not Progressing

## 2022-04-25 NOTE — Progress Notes (Signed)
1:1 note  Patient has been out of her room this evening and acknowledges she  feels less paranoid. She reports the voices are still there telling her to self-harm but they are better since she took the prn zyprexa. Patient denies HI at present.   Pt will remain on 1:1 due to inability to contract for safety. She intermittently continues to scratch her arms with her nails, but she is verbally redirected each time she does this.   Patient was given a stress ball to play with by rec therapist.

## 2022-04-25 NOTE — Progress Notes (Signed)
1:1 note  Patient resting in bed with no acute distress at present. Patient endorses paranoia against staff and feeling like we are out to get her. Patient reports she feels like everyone is out to get her, including her parents. Redirected pt, she took her prn zyprexa. Pt agreeable to continue taking medications and contracting for safety at this time.

## 2022-04-25 NOTE — [facility] Group Notes (Signed)
Pt attended NA

## 2022-04-25 NOTE — Progress Notes (Signed)
   04/25/22 2200  Psych Admission Type (Psych Patients Only)  Admission Status Voluntary  Psychosocial Assessment  Patient Complaints Anxiety  Eye Contact Brief  Facial Expression Anxious  Affect Anxious;Preoccupied  Speech Pressured  Interaction Childlike;Needy  Motor Activity Fidgety  Appearance/Hygiene Disheveled  Behavior Characteristics Anxious;Fidgety  Mood Depressed  Thought Process  Coherency WDL  Content Blaming others  Delusions Paranoid  Perception Hallucinations  Hallucination Auditory  Judgment Impaired  Confusion None  Danger to Self  Current suicidal ideation? Passive  Self-Injurious Behavior Some self-injurious ideation observed or expressed.  No lethal plan expressed   Agreement Not to Harm Self Yes  Description of Agreement verbal  Danger to Others  Danger to Others None reported or observed  Danger to Others Abnormal  Harmful Behavior to others No threats or harm toward other people  Destructive Behavior No threats or harm toward property

## 2022-04-25 NOTE — Progress Notes (Signed)
1:1 note  Pt stable. She is in a calm and pleasant mood. She is in the dayroom, positively interacting with staff and peers.   Staff continues frequent verbal interactions with patient. Pt continues to contract for safety. Currently denies SI/HI and AVH. Skin has no new changes.   Provider updated with patient status. Patient is candidate to trail off 1:1.

## 2022-04-25 NOTE — Progress Notes (Signed)
[place] [facility] 1:1 Observation Documentation    Patient's Behavior:  Calm  Patient's Condition:  Stable   Patient's Conversation:  Pt is lying supine on her left side, and was conversing about the book she is currently reading. She states, " I think the medications that you gave help me relax a lot." Patient contracts for safety. Patient is receptive and cooperative.   [place] 04/25/2022, 10: 28 PM

## 2022-04-25 NOTE — Group Note (Signed)
Recreation Therapy Group Note   Group Topic:Team Building  Group Date: 04/25/2022 Start Time: 0935 End Time: 1007 Facilitators: Chantille Navarrete-McCall, LRT,CTRS Location: [address] Dayroom   Goal Area(s) Addresses:  Patient will effectively work with peer towards shared goal.  Patient will identify skills used to make activity successful.  Patient will identify how skills used during activity can be used to reach post d/c goals.    Group Description: Landing Pad. In teams of 3-5, patients were given 12 plastic drinking straws and an equal length of masking tape. Using the materials provided, patients were asked to build a landing pad to catch a golf ball dropped from approximately 5 feet in the air. All materials were required to be used by the team in their design. LRT facilitated post-activity discussion.   Affect/Mood: N/A   Participation Level: Did not attend    Clinical Observations/Individualized Feedback:       Plan: Continue to engage patient in RT group sessions 2-3x/week.   Shedric Fredericks-McCall, LRT,CTRS 04/25/2022 12:26 PM

## 2022-04-25 NOTE — Progress Notes (Signed)
[facility] MD Progress Note  04/25/2022 5:55 PM [name]  MRN:  [id number]  Subjective:   [name] is a 32 year old female with a psychiatric history of Schizoaffective disorder bipolar type, Anxiety, Autism spectrum disorder, PTSD, Borderline personality disorder, who was admitted to this psychiatric unit for evaluation and treatment after suicide ideation and attempt via cutting self.   The patient continues to be on a one-to-one sitter.  She attempted to self-harm yesterday and also today by scratching herself.  She reports that she still feels overwhelmed.  She reports having auditory hallucinations to harm herself.  She continues to have active suicidal thoughts at this time, with no specific plan during my interview today.  She reports her mood as overwhelmed and depressed.  Reports anxiety level is high.  She reports Zyprexa as needed and Ativan as needed when she receives these as helpful.  She reports anxiety level continues to be high.  Reports sleep is okay.  Reports appetite is low.  Denies any HI. She did take her medications this morning, which is an improvement, when she refused them on Monday.  Denies having side effects to current psychiatric medications.      Principal Problem: Schizoaffective disorder, bipolar type ([place]) Diagnosis: Principal Problem:   Schizoaffective disorder, bipolar type ([place]) Active Problems:   Borderline personality disorder (HCC)   PTSD (post-traumatic stress disorder)   GAD (generalized anxiety disorder)  Total Time spent with patient: 20 minutes  Past Psychiatric History:  Previous Psych Diagnoses: Schizoaffective disorder bipolar type, Anxiety, Autism spectrum disorder, PTSD, Borderline personality disorder Prior inpatient treatment: multiple Current/prior outpatient treatment: [facility] Prior rehab hx: denies multiple Psychotherapy hx: [facility] History of suicide: 20-30 times by overdosing on pills, cutting or drinking 409  disinfectant History of homicide or aggression: denies Psychiatric medication history:  Previously trialed Tegretol, Haldol, Clozaril, Depakote, Invega, Lamictal, Seroquel, asenapine, abilify, vraylar, Lexapro, Zoloft  Neuromodulation history: denies    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 fragile X syndrome    Chronic constipation    Depression    Drug-seeking behavior    Essential tremor    Headache    Overdose of acetaminophen 07/2017   and other meds   Personality disorder ([place])    Schizo-affective psychosis ([place])    Schizoaffective disorder, bipolar type ([place])    Seizures ([place])    Last seizure December 2017   Sleep apnea     Past Surgical History:  Procedure Laterality Date   MOUTH SURGERY  2009 or 2010   Family History:  Family History  Problem Relation Age of Onset   Mental illness Father    Asthma Father    PDD Brother    Seizures Brother    Family Psychiatric  History: See H&P Social History:  Social History   Substance and Sexual Activity  Alcohol Use No   Alcohol/week: 1.0 standard drink of alcohol   Types: 1 Standard drinks or equivalent per week   Comment: denies at this time     Social History   Substance and Sexual Activity  Drug Use No   Comment: History of cocaine use at age 89 for 4 months    Social History   Socioeconomic History   Marital status: Widowed    Spouse name: Not on file   Number of  children: 0   Years of education: Not on file   Highest education level: Not on file  Occupational History   Occupation: disability  Tobacco Use   Smoking status: Former    Packs/day: 0    Types: Cigarettes   Smokeless tobacco: Never   Tobacco comments:    Smoked for 2  years age [age]  Vaping Use   Vaping Use: Never used  Substance and Sexual Activity   Alcohol use: No    Alcohol/week: 1.0 standard drink of alcohol     Types: 1 Standard drinks or equivalent per week    Comment: denies at this time   Drug use: No    Comment: History of cocaine use at age 15 for 4 months   Sexual activity: Not Currently    Birth control/protection: None  Other Topics Concern   Not on file  Social History Narrative   Marital status: Widowed      Children: daughter      Lives: with boyfriend, in two story home      Employment:  Disability      Tobacco: quit smoking; smoked for two years.      Alcohol ;none      Drugs: none   Has not traveled outside of the country.   Right handed         Social Determinants of Health   Financial Resource Strain: Not on file  Food Insecurity: No Food Insecurity (04/19/2022)   Hunger Vital Sign    Worried About Running Out of Food in the Last Year: Never true    Ran Out of Food in the Last Year: Never true  Transportation Needs: Unmet Transportation Needs (04/19/2022)   PRAPARE - Hydrologist (Medical): Yes    Lack of Transportation (Non-Medical): Yes  Physical Activity: Not on file  Stress: Not on file  Social Connections: Not on file   Additional Social History:                          Current Medications: Current Facility-Administered Medications  Medication Dose Route Frequency Provider Last Rate Last Admin   acetaminophen (TYLENOL) tablet 650 mg  650 mg Oral Q6H PRN Motley-Mangrum, Jadeka A, PMHNP       albuterol (VENTOLIN HFA) 108 (90 Base) MCG/ACT inhaler 2 puff  2 puff Inhalation Q6H PRN Motley-Mangrum, Jadeka A, PMHNP   2 puff at 04/21/22 1238   alum & mag hydroxide-simeth (MAALOX/MYLANTA) 200-200-20 MG/5ML suspension 30 mL  30 mL Oral Q4H PRN Motley-Mangrum, Jadeka A, PMHNP       cariprazine (VRAYLAR) capsule 3 mg  3 mg Oral Daily Motley-Mangrum, Jadeka A, PMHNP   3 mg at 04/25/22 0824   escitalopram (LEXAPRO) tablet 10 mg  10 mg Oral Daily Motley-Mangrum, Jadeka A, PMHNP   10 mg at 04/25/22 0823   hydrOXYzine (ATARAX) tablet  25 mg  25 mg Oral TID PRN Motley-Mangrum, Jadeka A, PMHNP   25 mg at 04/25/22 1310   magnesium hydroxide (MILK OF MAGNESIA) suspension 30 mL  30 mL Oral Daily PRN Motley-Mangrum, Jadeka A, PMHNP       melatonin tablet 3 mg  3 mg Oral QHS Motley-Mangrum, Jadeka A, PMHNP   3 mg at 04/24/22 2040   metoprolol succinate (TOPROL-XL) 24 hr tablet 12.5 mg  12.5 mg Oral Daily Motley-Mangrum, Jadeka A, PMHNP   12.5 mg at 04/25/22 0855   mometasone-formoterol (DULERA) 200-5 MCG/ACT  inhaler 2 puff  2 puff Inhalation BID Lindell Spar I, NP   2 puff at 04/25/22 0824   neomycin-bacitracin-polymyxin XX123456 OINT 1 Application  1 Application Topical Daily PRN Onuoha, Chinwendu V, NP   1 Application at [id number] 1436   OLANZapine (ZYPREXA) injection 5 mg  5 mg Intramuscular TID PRN Lenon Kuennen, Ovid Curd, MD       OLANZapine zydis (ZYPREXA) disintegrating tablet 5 mg  5 mg Oral TID PRN Kennisha [name], Ovid Curd, MD   5 mg at 04/25/22 1436   pantoprazole (PROTONIX) EC tablet 40 mg  40 mg Oral Daily Motley-Mangrum, Jadeka A, PMHNP   40 mg at 04/25/22 0824   prazosin (MINIPRESS) capsule 1 mg  1 mg Oral QHS Motley-Mangrum, Jadeka A, PMHNP   1 mg at 04/24/22 2040   pregabalin (LYRICA) capsule 100 mg  100 mg Oral TID Motley-Mangrum, Jadeka A, PMHNP   100 mg at 04/25/22 1613   traZODone (DESYREL) tablet 50 mg  50 mg Oral QHS PRN Motley-Mangrum, Jadeka A, PMHNP   50 mg at 04/24/22 2056    Lab Results: No results found for this or any previous visit (from the past 48 hour(s)).  Blood Alcohol level:  Lab Results  Component Value Date   ETH <10 04/18/2022   ETH <10 [id number]    Metabolic Disorder Labs: Lab Results  Component Value Date   HGBA1C 5.2 04/20/2022   MPG 103 04/20/2022   MPG 96.8 10/07/2021   Lab Results  Component Value Date   PROLACTIN 66.2 (H) 03/02/2021   PROLACTIN 66.1 (H) 02/04/2020   Lab Results  Component Value Date   CHOL 191 04/20/2022   TRIG 163 (H) 04/20/2022   HDL 52 04/20/2022   CHOLHDL  3.7 04/20/2022   VLDL 33 04/20/2022   LDLCALC 106 (H) 04/20/2022   LDLCALC 102 (H) 12/10/2021    Physical Findings: AIMS:  , ,  ,  ,    CIWA:    COWS:     Musculoskeletal: Strength & Muscle Tone: Lying in bed Gait & Station: Lying in bed Patient leans: Lying in bed  Psychiatric Specialty Exam:  Presentation  General Appearance:  Disheveled  Eye Contact: Poor  Speech: Slow  Speech Volume: Decreased  Handedness: Right   Mood and Affect  Mood: Anxious; Depressed; Worthless  Affect: Depressed   Thought Process  Thought Processes: Linear  Descriptions of Associations:Intact  Orientation:Full (Time, Place and Person)  Thought Content:Paranoid Ideation  History of Schizophrenia/Schizoaffective disorder:Yes  Duration of Psychotic Symptoms:Greater than six months  Hallucinations:No data recorded Reports having command auditory hallucinations to harm self  Ideas of Reference:Paranoia; Delusions  Suicidal Thoughts:Suicidal Thoughts: Yes, active, with plan to cut self.  On one-to-one sitter.  Homicidal Thoughts:No data recorded Denies  Sensorium  Memory: Immediate Good; Recent Good; Remote Good  Judgment: Impaired  Insight: Lacking   Executive Functions  Concentration: Fair  Attention Span: Fair   Psychomotor Activity  Psychomotor Activity: No data recorded Normal  Assets  Assets: Communication Skills; Resilience; Social Support; Physical Health   Sleep  Sleep: No data recorded Okay   Physical Exam: Physical Exam Vitals reviewed.  Pulmonary:     Effort: Pulmonary effort is normal.  Neurological:     Mental Status: She is alert.    Review of Systems  Constitutional:  Negative for chills and fever.  Cardiovascular:  Negative for chest pain and palpitations.  Neurological:  Negative for dizziness, tingling, tremors and headaches.  Psychiatric/Behavioral:  Positive for depression and suicidal  ideas. The patient is  nervous/anxious and has insomnia.    Blood pressure 119/79, pulse 99, temperature 98.6 F (37 C), temperature source Oral, resp. rate 20, height 5\' 4"  (1.626 m), weight 90.7 kg, SpO2 100 %. Body mass index is 34.33 kg/m.   Treatment Plan Summary:  ASSESSMENT:   Diagnoses / Active Problems: Schizoaffective d/o BP type GAD PTSD Borderline PD H/o autism?    PLAN: Safety and Monitoring:             --  Voluntary admission to inpatient psychiatric unit for safety, stabilization and treatment             -- Daily contact with patient to assess and evaluate symptoms and progress in treatment             -- Patient's case to be discussed in multi-disciplinary team meeting             -- Observation Level : q15 minute checks             -- Vital signs:  q12 hours             -- Precautions: suicide, elopement, and assault   2. Psychiatric Diagnoses and Treatment:               -Continue vraylar 3 mg qd for schizoaffective do -continue lexapro 10 mg qd for gad, ptsd -continue prazosin 1 mg qhs for ptsd nightmares -continue lyrica 100 mg tid for fibromyalgia (outpt med) -continue metoprolol ER 12.5 mg qd for tachycarida   -Continue one-to-one sitter for active suicidal thoughts   --  The risks/benefits/side-effects/alternatives to this medication were discussed in detail with the patient and time was given for questions. The patient consents to medication trial.                -- Metabolic profile and EKG monitoring obtained while on an atypical antipsychotic (BMI: Lipid Panel: HbgA1c: QTc:)              -- Encouraged patient to participate in unit milieu and in scheduled group therapies                          3. Medical Issues Being Addressed:          Metoprolol for tachycardia Lyrica for fibromyalgia Pantoprazole for reflux   4. Discharge Planning:              -- Social work and case management to assist with discharge planning and identification of hospital follow-up  needs prior to discharge             -- Estimated LOS: 3-4 days             -- Discharge Concerns: Need to establish a safety plan; Medication compliance and effectiveness             -- Discharge Goals: Return home with outpatient referrals for mental health follow-up including medication management/psychotherapy      Christoper Allegra, MD 04/25/2022, 5:55 PM  Total Time Spent in Direct Patient Care:  I personally spent 35 minutes on the unit in direct patient care. The direct patient care time included face-to-face time with the patient, reviewing the patient's chart, communicating with other professionals, and coordinating care. Greater than 50% of this time was spent in counseling or coordinating care with the patient regarding goals of hospitalization, psycho-education, and discharge planning needs.   Janine Limbo,  MD Psychiatrist

## 2022-04-25 NOTE — Progress Notes (Signed)
1:1 note   Patient is alert and oriented x4. Scratches noted to bilateral arms from self-harm episode last night. Patient endorses passive SI and cannot contract for safety- she remain on a 1:1 status.

## 2022-04-26 DIAGNOSIS — F319 Bipolar disorder, unspecified: Secondary | ICD-10-CM | POA: Diagnosis not present

## 2022-04-26 NOTE — Progress Notes (Signed)
[facility] 1:1 Observation Documentation   Patient's Behavior:  Calm  Patient's Condition:  Stable   Patient's Conversation:  Pt located in patient room, lying supine in on her right side, appears to be asleep at this time. Equal chest rise and fall notable. Unlabored respirations. No apparent distress at this time   [place] 04/26/2022, 4:28 AM

## 2022-04-26 NOTE — Group Note (Signed)
Date:  04/26/2022 Time:  4:53 PM  Group Topic/Focus:  Wellness Toolbox:   The focus of this group is to discuss various aspects of wellness, balancing those aspects and exploring ways to increase the ability to experience wellness.  Patients will create a wellness toolbox for use upon discharge.    Participation Level:  Active  Participation Quality:  Appropriate  Affect:  Appropriate  Cognitive:  Appropriate  Insight: Appropriate  Engagement in Group:  Engaged  Modes of Intervention:  Discussion  Additional Comments:     Jerrye Beavers 04/26/2022, 4:53 PM

## 2022-04-26 NOTE — Plan of Care (Signed)
  Problem: Coping: Goal: Ability to verbalize frustrations and anger appropriately will improve Outcome: Progressing Goal: Ability to demonstrate self-control will improve Outcome: Progressing   Problem: Physical Regulation: Goal: Ability to maintain clinical measurements within normal limits will improve Outcome: Progressing   Problem: Safety: Goal: Periods of time without injury will increase Outcome: Progressing   Problem: Coping: Goal: Coping ability will improve Outcome: Progressing Goal: Will verbalize feelings Outcome: Progressing

## 2022-04-26 NOTE — Progress Notes (Signed)
PRN Zyprexa given due to patient saying, "I have three voices in my head right now. One is a murmur, but one is saying, kill yourself kill yourself." Verbal redirection given and prn provided.

## 2022-04-26 NOTE — Progress Notes (Signed)
[facility] MD Progress Note  04/26/2022 2:41 PM [name]  MRN:  [id number]  Subjective:   [name] is a 32 year old female with a psychiatric history of Schizoaffective disorder bipolar type, Anxiety, Autism spectrum disorder, PTSD, Borderline personality disorder, who was admitted to this psychiatric unit for evaluation and treatment after suicide ideation and attempt via cutting self.   Overnight, and nursing staff discontinued the one-to-one sitter.  In my evaluation today, the patient reports her mood is less depressed.  She reports feeling less anxious.  She reports her sleep is okay.  She reports that her auditory hallucinations are worse this morning but improved when she got her morning medication plus a dose of Zyprexa as needed this morning.  She reports having no suicidal thoughts since yesterday.  She reports having no HI.  Reports appetite is better.  Concentration is better.  She appears less overwhelmed and anxious.  She continues to have some what appear to be writhing movements of the left and right feet and also abnormal tongue movements.  The patient states she is aware of this and this is not abnormal movements and this is due to being anxious.  We discussed this could be tardive dyskinesia and could be treated with medication, the patient declines to starting treatment for this that she believes these movements are due to anxiety. She otherwise denies having side effects to current psychiatric medications. She inquires about discharge time line.  We discussed that if symptoms remain improved, stable, without any safety concerns, and without any need for one-to-one sitter, she could be considered for safe discharge as early as Saturday morning (which would be 48 hours plus without a sitter).  Patient is agreeable.  She agrees to approach nursing staff if psychiatric symptoms or suicidal thoughts worsen.   Principal Problem: Schizoaffective disorder, bipolar type  ([place]) Diagnosis: Principal Problem:   Schizoaffective disorder, bipolar type ([place]) Active Problems:   Borderline personality disorder ([place])   PTSD (post-traumatic stress disorder)   GAD (generalized anxiety disorder)  Total Time spent with patient: 15 minutes  Past Psychiatric History:  Previous Psych Diagnoses: Schizoaffective disorder bipolar type, Anxiety, Autism spectrum disorder, PTSD, Borderline personality disorder Prior inpatient treatment: multiple Current/prior outpatient treatment: [facility] Prior rehab hx: denies multiple Psychotherapy hx: [facility] History of suicide: 20-30 times by overdosing on pills, cutting or drinking 409 disinfectant History of homicide or aggression: denies Psychiatric medication history:  Previously trialed Tegretol, Haldol, Clozaril, Depakote, Invega, Lamictal, Seroquel, asenapine, abilify, vraylar, Lexapro, Zoloft  Neuromodulation history: denies  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 fragile X syndrome    Chronic constipation    Depression    Drug-seeking behavior    Essential tremor    Headache    Overdose of acetaminophen 07/2017   and other meds   Personality disorder ([place])    Schizo-affective psychosis ([place])    Schizoaffective disorder, bipolar type ([place])    Seizures ([place])    Last seizure December 2017   Sleep apnea     Past Surgical History:  Procedure Laterality Date   MOUTH SURGERY  2009 or 2010   Family History:  Family History  Problem Relation Age of Onset   Mental illness Father    Asthma Father    PDD Brother    Seizures Brother  Family Psychiatric  History: See H&P Social History:  Social History   Substance and Sexual Activity  Alcohol Use No   Alcohol/week: 1.0 standard drink of alcohol   Types: 1 Standard drinks or equivalent per week   Comment: denies at this time     Social History    Substance and Sexual Activity  Drug Use No   Comment: History of cocaine use at age 57 for 4 months    Social History   Socioeconomic History   Marital status: Widowed    Spouse name: Not on file   Number of children: 0   Years of education: Not on file   Highest education level: Not on file  Occupational History   Occupation: disability  Tobacco Use   Smoking status: Former    Packs/day: 0    Types: Cigarettes   Smokeless tobacco: Never   Tobacco comments:    Smoked for 2  years age [age]  Vaping Use   Vaping Use: Never used  Substance and Sexual Activity   Alcohol use: No    Alcohol/week: 1.0 standard drink of alcohol    Types: 1 Standard drinks or equivalent per week    Comment: denies at this time   Drug use: No    Comment: History of cocaine use at age 27 for 4 months   Sexual activity: Not Currently    Birth control/protection: None  Other Topics Concern   Not on file  Social History Narrative   Marital status: Widowed      Children: daughter      Lives: with boyfriend, in two story home      Employment:  Disability      Tobacco: quit smoking; smoked for two years.      Alcohol ;none      Drugs: none   Has not traveled outside of the country.   Right handed         Social Determinants of Health   Financial Resource Strain: Not on file  Food Insecurity: No Food Insecurity (04/19/2022)   Hunger Vital Sign    Worried About Running Out of Food in the Last Year: Never true    Ran Out of Food in the Last Year: Never true  Transportation Needs: Unmet Transportation Needs (04/19/2022)   PRAPARE - Hydrologist (Medical): Yes    Lack of Transportation (Non-Medical): Yes  Physical Activity: Not on file  Stress: Not on file  Social Connections: Not on file   Additional Social History:                           Current Medications: Current Facility-Administered Medications  Medication Dose Route Frequency Provider  Last Rate Last Admin   acetaminophen (TYLENOL) tablet 650 mg  650 mg Oral Q6H PRN Motley-Mangrum, Jadeka A, PMHNP       albuterol (VENTOLIN HFA) 108 (90 Base) MCG/ACT inhaler 2 puff  2 puff Inhalation Q6H PRN Motley-Mangrum, Jadeka A, PMHNP   2 puff at 04/21/22 1238   alum & mag hydroxide-simeth (MAALOX/MYLANTA) 200-200-20 MG/5ML suspension 30 mL  30 mL Oral Q4H PRN Motley-Mangrum, Jadeka A, PMHNP       cariprazine (VRAYLAR) capsule 3 mg  3 mg Oral Daily Motley-Mangrum, Jadeka A, PMHNP   3 mg at 04/26/22 0744   escitalopram (LEXAPRO) tablet 10 mg  10 mg Oral Daily Motley-Mangrum, Jadeka A, PMHNP   10 mg at 04/26/22 0744  hydrOXYzine (ATARAX) tablet 25 mg  25 mg Oral TID PRN Motley-Mangrum, Jadeka A, PMHNP   25 mg at 04/26/22 1406   magnesium hydroxide (MILK OF MAGNESIA) suspension 30 mL  30 mL Oral Daily PRN Motley-Mangrum, Jadeka A, PMHNP       melatonin tablet 3 mg  3 mg Oral QHS Motley-Mangrum, Jadeka A, PMHNP   3 mg at 04/25/22 2030   metoprolol succinate (TOPROL-XL) 24 hr tablet 12.5 mg  12.5 mg Oral Daily Motley-Mangrum, Jadeka A, PMHNP   12.5 mg at 04/26/22 0744   mometasone-formoterol (DULERA) 200-5 MCG/ACT inhaler 2 puff  2 puff Inhalation BID Lindell Spar I, NP   2 puff at 04/26/22 0745   neomycin-bacitracin-polymyxin XX123456 OINT 1 Application  1 Application Topical Daily PRN Onuoha, Chinwendu V, NP   1 Application at [id number]   OLANZapine (ZYPREXA) injection 5 mg  5 mg Intramuscular TID PRN [place] Droll, Ovid Curd, MD       OLANZapine zydis (ZYPREXA) disintegrating tablet 5 mg  5 mg Oral TID PRN Aradia Estey, Ovid Curd, MD   5 mg at 04/26/22 0621   pantoprazole (PROTONIX) EC tablet 40 mg  40 mg Oral Daily Motley-Mangrum, Jadeka A, PMHNP   40 mg at 04/26/22 0745   prazosin (MINIPRESS) capsule 1 mg  1 mg Oral QHS Motley-Mangrum, Jadeka A, PMHNP   1 mg at 04/25/22 2030   pregabalin (LYRICA) capsule 100 mg  100 mg Oral TID Motley-Mangrum, Jadeka A, PMHNP   100 mg at 04/26/22 1148    traZODone (DESYREL) tablet 50 mg  50 mg Oral QHS PRN Motley-Mangrum, Jadeka A, PMHNP   50 mg at 04/25/22 2029    Lab Results: No results found for this or any previous visit (from the past 48 hour(s)).  Blood Alcohol level:  Lab Results  Component Value Date   ETH <10 04/18/2022   ETH <10 [id number]    Metabolic Disorder Labs: Lab Results  Component Value Date   HGBA1C 5.2 04/20/2022   MPG 103 04/20/2022   MPG 96.8 10/07/2021   Lab Results  Component Value Date   PROLACTIN 66.2 (H) 03/02/2021   PROLACTIN 66.1 (H) 02/04/2020   Lab Results  Component Value Date   CHOL 191 04/20/2022   TRIG 163 (H) 04/20/2022   HDL 52 04/20/2022   CHOLHDL 3.7 04/20/2022   VLDL 33 04/20/2022   LDLCALC 106 (H) 04/20/2022   LDLCALC 102 (H) 12/10/2021    Physical Findings: AIMS:  , ,  ,  ,    CIWA:    COWS:     Musculoskeletal: Strength & Muscle Tone: within normal limits Gait & Station: normal Patient leans: N/A  Psychiatric Specialty Exam:  Presentation  General Appearance:  Casual  Eye Contact: Fair  Speech: Normal Rate  Speech Volume: Normal  Handedness: Right   Mood and Affect  Mood: Anxious; Dysphoric  Affect: Depressed; Restricted   Thought Process  Thought Processes: Linear  Descriptions of Associations:Intact  Orientation:Full (Time, Place and Person)  Thought Content:Logical  History of Schizophrenia/Schizoaffective disorder:Yes  Duration of Psychotic Symptoms:Greater than six months  Hallucinations:Hallucinations: Auditory  Ideas of Reference:None  Suicidal Thoughts:Suicidal Thoughts: No  Homicidal Thoughts:Homicidal Thoughts: No   Sensorium  Memory: Immediate Fair; Recent Fair; Remote Fair  Judgment: Impaired  Insight: Lacking   Executive Functions  Concentration: Fair  Attention Span: Fair  Recall: Good  Fund of Knowledge: Good  Language: Good   Psychomotor Activity  Psychomotor Activity: Psychomotor  Activity: Normal   Assets  Assets: Armed forces logistics/support/administrative officer; Resilience; Social Support; Physical Health   Sleep  Sleep: Sleep: Fair    Physical Exam: Physical Exam Vitals reviewed.  Pulmonary:     Effort: Pulmonary effort is normal.  Neurological:     Mental Status: She is alert.     Motor: No weakness.     Gait: Gait normal.    Review of Systems  Constitutional:  Negative for chills and fever.  Cardiovascular:  Negative for chest pain and palpitations.  Neurological:  Negative for dizziness, tingling and headaches.  Psychiatric/Behavioral:  Positive for depression and hallucinations. Negative for substance abuse and suicidal ideas. The patient is nervous/anxious. The patient does not have insomnia.    Blood pressure 107/80, pulse 99, temperature 98.4 F (36.9 C), temperature source Oral, resp. rate 20, height 5\' 4"  (1.626 m), weight 90.7 kg, SpO2 98 %. Body mass index is 34.33 kg/m.   Treatment Plan Summary:   ASSESSMENT:   Diagnoses / Active Problems: Schizoaffective d/o BP type GAD PTSD Borderline PD H/o autism?    PLAN: Safety and Monitoring:             --  Voluntary admission to inpatient psychiatric unit for safety, stabilization and treatment             -- Daily contact with patient to assess and evaluate symptoms and progress in treatment             -- Patient's case to be discussed in multi-disciplinary team meeting             -- Observation Level : q15 minute checks             -- Vital signs:  q12 hours             -- Precautions: suicide, elopement, and assault   2. Psychiatric Diagnoses and Treatment:               -Continue vraylar 3 mg qd for schizoaffective do -continue lexapro 10 mg qd for gad, ptsd -continue prazosin 1 mg qhs for ptsd nightmares -continue lyrica 100 mg tid for fibromyalgia (outpt med) -continue metoprolol ER 12.5 mg qd for tachycarida    -Continue one-to-one sitter for active suicidal thoughts   --  The  risks/benefits/side-effects/alternatives to this medication were discussed in detail with the patient and time was given for questions. The patient consents to medication trial.                -- Metabolic profile and EKG monitoring obtained while on an atypical antipsychotic (BMI: Lipid Panel: HbgA1c: QTc:)              -- Encouraged patient to participate in unit milieu and in scheduled group therapies                          3. Medical Issues Being Addressed:          Metoprolol for tachycardia Lyrica for fibromyalgia Pantoprazole for reflux   4. Discharge Planning:              -- Social work and case management to assist with discharge planning and identification of hospital follow-up needs prior to discharge             -- Estimated LOS: 2-3 more days             -- Discharge Concerns: Need to establish a safety plan; Medication compliance and effectiveness             --  Discharge Goals: Return home with outpatient referrals for mental health follow-up including medication management/psychotherapy     Christoper Allegra, MD 04/26/2022, 2:41 PM  Total Time Spent in Direct Patient Care:  I personally spent 30 minutes on the unit in direct patient care. The direct patient care time included face-to-face time with the patient, reviewing the patient's chart, communicating with other professionals, and coordinating care. Greater than 50% of this time was spent in counseling or coordinating care with the patient regarding goals of hospitalization, psycho-education, and discharge planning needs.   Janine Limbo, MD Psychiatrist

## 2022-04-26 NOTE — Progress Notes (Signed)
[facility] 1:1 Observation Documentation    Patient's Behavior:  Calm  Patient's Condition:  Stable  Patient's Conversation:  Pt located in patient room, lying supine in on her right side, appears to be asleep at this time. Equal chest rise and fall notable. Unlabored respirations. No apparent distress at this time.   [place] 04/26/2022, 2:28 AM

## 2022-04-26 NOTE — Progress Notes (Signed)
   04/26/22 0615  15 Minute Checks  Location Bedroom  Visual Appearance Calm  Behavior Composed  Sleep (Behavioral Health Patients Only)  Calculate sleep? (Click Yes once per 24 hr at 0600 safety check) Yes  Documented sleep last 24 hours 7.5

## 2022-04-26 NOTE — Progress Notes (Signed)
[facility] 1:1 Observation Documentation    Patient's Behavior:  Calm  Patient's Condition:  Stable   Patient's Conversation:  Patient able to ambulate to the nurse's station. Patient reports "I slept really good and I am feeling okay." Patient conversing about getting her teeth pulled. Patient asked for Gatorade because she was thirsty. Patient requested PRN medications due to auditory hallucinations, reports "I just hear my parents." Patient able to contract for safety at this time. Patient receptive and cooperative. Patient went to breakfast, and performing in the milieu with peers.    [place] 04/26/2022, 6:21 AM

## 2022-04-26 NOTE — Group Note (Signed)
Date:  04/26/2022 Time:  12:01 PM  Group Topic/Focus:  Orientation:   The focus of this group is to educate the patient on the purpose and policies of crisis stabilization and provide a format to answer questions about their admission.  The group details unit policies and expectations of patients while admitted.    Participation Level:  Active  Participation Quality:  Appropriate  Affect:  Appropriate  Cognitive:  Appropriate  Insight: Appropriate  Engagement in Group:  Engaged  Modes of Intervention:  Discussion  Additional Comments:     Jerrye Beavers 04/26/2022, 12:01 PM

## 2022-04-26 NOTE — Progress Notes (Signed)
Area on left forearm is red around the wound, antibiotic ointment has been applied as ordered, Dr. Caswell Corwin notified.

## 2022-04-26 NOTE — Progress Notes (Signed)
[facility] 1:1 Observation Documentation     Patient's Behavior:  Calm  Patient's Condition:  Stable  Patient's Conversation:  Pt located in patient room, lying supine in on her right side, appears to be asleep at this time. Equal chest rise and fall notable. Unlabored respirations. No apparent distress at this time.  [place] 04/26/2022, 12:28 AM

## 2022-04-26 NOTE — Progress Notes (Signed)
D: Patient alert and oriented, able to make needs known. Denies SI/HI, AVH at present. Denies pain at present. Patient goal today "use coping skills instead of scratch, like the stress ball." Rates depression 5/10, hopelessness 10/10, and anxiety 4/10. Patient reports energy level as low. She reports she slept well last night. Patient does not request any PRN medication at this time.   A: Scheduled medications administered to patient per MD order. Support and encouragement provided. Routine safety checks conducted every fifteen minutes. Patient informed to notify staff with problems or concerns. Frequent verbal contact made.   R: No adverse drug reactions noted. Patient contracts for safety at this time. Patient is compliant with medications and treatment plan. Patient receptive, calm and cooperative. Patient interacts with others appropriately on unit at present. Patient remains safe at present.

## 2022-04-26 NOTE — Plan of Care (Signed)
  Problem: Education: Goal: Mental status will improve Outcome: Progressing Goal: Verbalization of understanding the information provided will improve Outcome: Progressing   Problem: Coping: Goal: Ability to verbalize frustrations and anger appropriately will improve Outcome: Progressing   Problem: Health Behavior/Discharge Planning: Goal: Compliance with treatment plan for underlying cause of condition will improve Outcome: Progressing   Problem: Safety: Goal: Periods of time without injury will increase Outcome: Progressing   Problem: Safety: Goal: Ability to disclose and discuss suicidal ideas will improve Outcome: Progressing Goal: Ability to identify and utilize support systems that promote safety will improve Outcome: Progressing

## 2022-04-27 ENCOUNTER — Encounter: Payer: PPO | Admitting: Obstetrics and Gynecology

## 2022-04-27 DIAGNOSIS — L539 Erythematous condition, unspecified: Secondary | ICD-10-CM | POA: Insufficient documentation

## 2022-04-27 DIAGNOSIS — F319 Bipolar disorder, unspecified: Secondary | ICD-10-CM | POA: Diagnosis not present

## 2022-04-27 HISTORY — DX: Erythematous condition, unspecified: L53.9

## 2022-04-27 MED ORDER — CEPHALEXIN 500 MG PO CAPS
500.0000 mg | ORAL_CAPSULE | Freq: Two times a day (BID) | ORAL | Status: DC
  Administered 2022-04-27: 500 mg via ORAL
  Filled 2022-04-27 (×3): qty 1

## 2022-04-27 MED ORDER — CARIPRAZINE HCL 3 MG PO CAPS
4.5000 mg | ORAL_CAPSULE | Freq: Every day | ORAL | Status: DC
  Administered 2022-04-28 – 2022-04-30 (×3): 4.5 mg via ORAL
  Filled 2022-04-27 (×5): qty 1

## 2022-04-27 MED ORDER — CEPHALEXIN 500 MG PO CAPS
500.0000 mg | ORAL_CAPSULE | Freq: Three times a day (TID) | ORAL | Status: DC
  Administered 2022-04-27 – 2022-04-30 (×9): 500 mg via ORAL
  Filled 2022-04-27 (×12): qty 1

## 2022-04-27 NOTE — Plan of Care (Signed)
°  Problem: Education: °Goal: Emotional status will improve °Outcome: Progressing °Goal: Mental status will improve °Outcome: Progressing °Goal: Verbalization of understanding the information provided will improve °Outcome: Progressing °  °

## 2022-04-27 NOTE — Group Note (Signed)
Recreation Therapy Group Note   Group Topic:Problem Solving  Group Date: 04/27/2022 Start Time: 0930 End Time: 1015 Facilitators: Erroll Wilbourne-McCall, LRT,CTRS Location: [address] Dayroom   Goal Area(s) Addresses:  Patient will effectively work with peer towards shared goal.  Patient will identify skills used to make activity successful.  Patient will share challenges and verbalize solution-driven approaches used. Patient will identify how skills used during activity can be used to reach post d/c goals.    Group Description: [employer]. Patients were provided the following materials: 2 drinking straws, 5 rubber bands, 5 paper clips, 2 index cards and 2 drinking cups. Using the provided materials patients were asked to build a launching mechanism to launch a ping pong ball across the room, approximately 10 feet. Patients were divided into teams of 3-5. Instructions required all materials be incorporated into the device, functionality of items left to the peer group's discretion.   Affect/Mood: N/A   Participation Level: Did not attend    Clinical Observations/Individualized Feedback:     Plan: Continue to engage patient in RT group sessions 2-3x/week.   Ryszard Socarras-McCall, LRT,CTRS 04/27/2022 11:16 AM

## 2022-04-27 NOTE — Progress Notes (Signed)
   04/27/22 2000  Psych Admission Type (Psych Patients Only)  Admission Status Voluntary  Psychosocial Assessment  Patient Complaints Anxiety;Other (Comment) ("im still hearing voices")  Eye Contact Brief  Facial Expression Anxious;Other (Comment) (using stress ball)  Affect Anxious  Speech Rapid  Interaction Childlike;Needy  Motor Activity Fidgety  Appearance/Hygiene Unremarkable  Behavior Characteristics Cooperative;Anxious  Mood Anxious  Thought Process  Coherency WDL  Content Blaming self  Delusions Paranoid  Perception Hallucinations  Hallucination Command  Judgment Impaired  Confusion None  Danger to Self  Current suicidal ideation? Denies  Self-Injurious Behavior No self-injurious ideation or behavior indicators observed or expressed   Agreement Not to Harm Self Yes  Description of Agreement verbal  Danger to Others  Danger to Others None reported or observed  Danger to Others Abnormal  Harmful Behavior to others No threats or harm toward other people  Destructive Behavior No threats or harm toward property

## 2022-04-27 NOTE — Progress Notes (Signed)
[name] MD Progress Note  04/27/2022 4:25 PM [name]  MRN:  [id number]  Subjective:   [name] is a 32 year old female with a psychiatric history of Schizoaffective disorder bipolar type, Anxiety, Autism spectrum disorder, PTSD, Borderline personality disorder, who was admitted to this psychiatric unit for evaluation and treatment after suicide ideation and attempt via cutting self.   Patient reports that her mood is up and down.  Reports feeling better yesterday was more depressed today.  Reports auditory hallucinations of her last night, but responded well to Zyprexa as needed.  She reports some paranoid return but less severe than before.  She reports SI has recurred some, described as passive without any intent or plan at this time.  Patient is willing to approach staff if the suicidal thoughts worsen.  Mood is dysphoric.  Anxiety level is up and down.  Appetite is okay.  Denying any HI.  Denying any VH.  Denies side effects current psychiatric medications.  She reports Zyprexa does help her anxiety and auditory hallucinations.  We discussed increasing the Vraylar she has required multiple doses of Zyprexa as needed, and patient is agreeable.  Discharge planning for Monday, the patient feels she is not safe for discharge until she has CST team that can meet her at the apartment, which can only be done on the weekdays.  Principal Problem: Schizoaffective disorder, bipolar type ([place]) Diagnosis: Principal Problem:   Schizoaffective disorder, bipolar type ([place]) Active Problems:   Borderline personality disorder (HCC)   PTSD (post-traumatic stress disorder)   GAD (generalized anxiety disorder)   Skin erythema  Total Time spent with patient: 15 minutes  Past Psychiatric History:  Previous Psych Diagnoses: Schizoaffective disorder bipolar type, Anxiety, Autism spectrum disorder, PTSD, Borderline personality disorder Prior inpatient treatment: multiple Current/prior outpatient  treatment: [facility] Prior rehab hx: denies multiple Psychotherapy hx: [facility] History of suicide: 20-30 times by overdosing on pills, cutting or drinking 409 disinfectant History of homicide or aggression: denies Psychiatric medication history:  Previously trialed Tegretol, Haldol, Clozaril, Depakote, Invega, Lamictal, Seroquel, asenapine, abilify, vraylar, Lexapro, Zoloft  Neuromodulation history: denies  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 fragile X syndrome    Chronic constipation    Depression    Drug-seeking behavior    Essential tremor    Headache    Overdose of acetaminophen 07/2017   and other meds   Personality disorder ([place])    Schizo-affective psychosis ([place])    Schizoaffective disorder, bipolar type ([place])    Seizures ([place])    Last seizure December 2017   Sleep apnea     Past Surgical History:  Procedure Laterality Date   MOUTH SURGERY  2009 or 2010   Family History:  Family History  Problem Relation Age of Onset   Mental illness Father    Asthma Father    PDD Brother    Seizures Brother    Family Psychiatric  History: See H&P Social History:  Social History   Substance and Sexual Activity  Alcohol Use No   Alcohol/week: 1.0 standard drink of alcohol   Types: 1 Standard drinks or equivalent per week   Comment: denies at this time     Social History   Substance and Sexual Activity  Drug Use No   Comment: History of cocaine use at age 85  for 4 months    Social History   Socioeconomic History   Marital status: Widowed    Spouse name: Not on file   Number of children: 0   Years of education: Not on file   Highest education level: Not on file  Occupational History   Occupation: disability  Tobacco Use   Smoking status: Former    Packs/day: 0    Types: Cigarettes   Smokeless tobacco: Never   Tobacco comments:    Smoked for 2   years age [age]  Vaping Use   Vaping Use: Never used  Substance and Sexual Activity   Alcohol use: No    Alcohol/week: 1.0 standard drink of alcohol    Types: 1 Standard drinks or equivalent per week    Comment: denies at this time   Drug use: No    Comment: History of cocaine use at age 69 for 4 months   Sexual activity: Not Currently    Birth control/protection: None  Other Topics Concern   Not on file  Social History Narrative   Marital status: Widowed      Children: daughter      Lives: with boyfriend, in two story home      Employment:  Disability      Tobacco: quit smoking; smoked for two years.      Alcohol ;none      Drugs: none   Has not traveled outside of the country.   Right handed         Social Determinants of Health   Financial Resource Strain: Not on file  Food Insecurity: No Food Insecurity (04/19/2022)   Hunger Vital Sign    Worried About Running Out of Food in the Last Year: Never true    Ran Out of Food in the Last Year: Never true  Transportation Needs: Unmet Transportation Needs (04/19/2022)   PRAPARE - Hydrologist (Medical): Yes    Lack of Transportation (Non-Medical): Yes  Physical Activity: Not on file  Stress: Not on file  Social Connections: Not on file   Additional Social History:                           Current Medications: Current Facility-Administered Medications  Medication Dose Route Frequency Provider Last Rate Last Admin   acetaminophen (TYLENOL) tablet 650 mg  650 mg Oral Q6H PRN Motley-Mangrum, Jadeka A, PMHNP       albuterol (VENTOLIN HFA) 108 (90 Base) MCG/ACT inhaler 2 puff  2 puff Inhalation Q6H PRN Motley-Mangrum, Jadeka A, PMHNP   2 puff at 04/21/22 1238   alum & mag hydroxide-simeth (MAALOX/MYLANTA) 200-200-20 MG/5ML suspension 30 mL  30 mL Oral Q4H PRN Motley-Mangrum, Al Pimple, PMHNP       [START ON 04/28/2022] cariprazine (VRAYLAR) capsule 4.5 mg  4.5 mg Oral Daily Tarron Krolak,  Ineze Serrao, MD       cephALEXin (KEFLEX) capsule 500 mg  500 mg Oral TID Nyoka Cowden, Terri L, RPH   500 mg at 04/27/22 1209   escitalopram (LEXAPRO) tablet 10 mg  10 mg Oral Daily Motley-Mangrum, Jadeka A, PMHNP   10 mg at 04/27/22 0842   hydrOXYzine (ATARAX) tablet 25 mg  25 mg Oral TID PRN Motley-Mangrum, Jadeka A, PMHNP   25 mg at 04/26/22 2137   magnesium hydroxide (MILK OF MAGNESIA) suspension 30 mL  30 mL Oral Daily PRN Motley-Mangrum, Al Pimple, PMHNP  melatonin tablet 3 mg  3 mg Oral QHS Motley-Mangrum, Jadeka A, PMHNP   3 mg at 04/26/22 2054   metoprolol succinate (TOPROL-XL) 24 hr tablet 12.5 mg  12.5 mg Oral Daily Motley-Mangrum, Jadeka A, PMHNP   12.5 mg at 04/27/22 0842   mometasone-formoterol (DULERA) 200-5 MCG/ACT inhaler 2 puff  2 puff Inhalation BID Lindell Spar I, NP   2 puff at 04/27/22 0844   neomycin-bacitracin-polymyxin [id number] OINT 1 Application  1 Application Topical Daily PRN Onuoha, Chinwendu V, NP   1 Application at [id number]   OLANZapine (ZYPREXA) injection 5 mg  5 mg Intramuscular TID PRN Kambryn Dapolito, Ovid Curd, MD       OLANZapine zydis (ZYPREXA) disintegrating tablet 5 mg  5 mg Oral TID PRN Kadyn Guild, Ovid Curd, MD   5 mg at 04/27/22 1538   pantoprazole (PROTONIX) EC tablet 40 mg  40 mg Oral Daily Motley-Mangrum, Jadeka A, PMHNP   40 mg at 04/27/22 0842   prazosin (MINIPRESS) capsule 1 mg  1 mg Oral QHS Motley-Mangrum, Jadeka A, PMHNP   1 mg at 04/26/22 2054   pregabalin (LYRICA) capsule 100 mg  100 mg Oral TID Motley-Mangrum, Jadeka A, PMHNP   100 mg at 04/27/22 1209   traZODone (DESYREL) tablet 50 mg  50 mg Oral QHS PRN Motley-Mangrum, Jadeka A, PMHNP   50 mg at 04/26/22 2053    Lab Results: No results found for this or any previous visit (from the past 48 hour(s)).  Blood Alcohol level:  Lab Results  Component Value Date   ETH <10 04/18/2022   ETH <10 [id number]    Metabolic Disorder Labs: Lab Results  Component Value Date   HGBA1C 5.2 04/20/2022   MPG  103 04/20/2022   MPG 96.8 10/07/2021   Lab Results  Component Value Date   PROLACTIN 66.2 (H) 03/02/2021   PROLACTIN 66.1 (H) 02/04/2020   Lab Results  Component Value Date   CHOL 191 04/20/2022   TRIG 163 (H) 04/20/2022   HDL 52 04/20/2022   CHOLHDL 3.7 04/20/2022   VLDL 33 04/20/2022   LDLCALC 106 (H) 04/20/2022   LDLCALC 102 (H) 12/10/2021    Physical Findings: AIMS:  , ,  ,  ,    CIWA:    COWS:     Musculoskeletal: Strength & Muscle Tone: within normal limits Gait & Station: normal Patient leans: N/A  Psychiatric Specialty Exam:  Presentation  General Appearance:  Casual  Eye Contact: Fair  Speech: Normal Rate  Speech Volume: Normal  Handedness: Right   Mood and Affect  Mood: Anxious; Dysphoric  Affect: Depressed; Restricted   Thought Process  Thought Processes: Linear  Descriptions of Associations:Intact  Orientation:Full (Time, Place and Person)  Thought Content:Logical  History of Schizophrenia/Schizoaffective disorder:Yes  Duration of Psychotic Symptoms:Greater than six months  Hallucinations:Hallucinations: Auditory  Ideas of Reference:None  Suicidal Thoughts:Suicidal Thoughts: yes, passive w/o intent or plan   Homicidal Thoughts:Homicidal Thoughts: No   Sensorium  Memory: Immediate Fair; Recent Fair; Remote Fair  Judgment: Impaired  Insight: Lacking   Executive Functions  Concentration: Fair  Attention Span: Fair  Recall: Good  Fund of Knowledge: Good  Language: Good   Psychomotor Activity  Psychomotor Activity: Psychomotor Activity: Normal   Assets  Assets: Communication Skills; Resilience; Social Support; Physical Health   Sleep  Sleep: Sleep: Fair    Physical Exam: Physical Exam Vitals reviewed.  Pulmonary:     Effort: Pulmonary effort is normal.  Neurological:     Mental Status:  She is alert.     Motor: No weakness.     Gait: Gait normal.    Review of Systems   Constitutional:  Negative for chills and fever.  Cardiovascular:  Negative for chest pain and palpitations.  Neurological:  Negative for dizziness, tingling and headaches.  Psychiatric/Behavioral:  Positive for depression and hallucinations. Negative for substance abuse and suicidal ideas. The patient is nervous/anxious. The patient does not have insomnia.    Blood pressure 123/74, pulse 89, temperature 97.8 F (36.6 C), resp. rate 18, height 5\' 4"  (1.626 m), weight 90.7 kg, SpO2 98 %. Body mass index is 34.33 kg/m.   Treatment Plan Summary:   ASSESSMENT:   Diagnoses / Active Problems: Schizoaffective d/o BP type GAD PTSD Borderline PD H/o autism?    PLAN: Safety and Monitoring:             --  Voluntary admission to inpatient psychiatric unit for safety, stabilization and treatment             -- Daily contact with patient to assess and evaluate symptoms and progress in treatment             -- Patient's case to be discussed in multi-disciplinary team meeting             -- Observation Level : q15 minute checks             -- Vital signs:  q12 hours             -- Precautions: suicide, elopement, and assault   2. Psychiatric Diagnoses and Treatment:               -INCREASE vraylar from 3 mg to 4.5 qd for schizoaffective do -continue lexapro 10 mg qd for gad, ptsd -continue prazosin 1 mg qhs for ptsd nightmares -continue lyrica 100 mg tid for fibromyalgia (outpt med) -continue metoprolol ER 12.5 mg qd for tachycarida       --  The risks/benefits/side-effects/alternatives to this medication were discussed in detail with the patient and time was given for questions. The patient consents to medication trial.                -- Metabolic profile and EKG monitoring obtained while on an atypical antipsychotic (BMI: Lipid Panel: HbgA1c: QTc:)              -- Encouraged patient to participate in unit milieu and in scheduled group therapies                          3. Medical  Issues Being Addressed:          Metoprolol for tachycardia Lyrica for fibromyalgia Pantoprazole for reflux   4. Discharge Planning:              -- Social work and case management to assist with discharge planning and identification of hospital follow-up needs prior to discharge             -- Estimated LOS: 2-3 more days             -- Discharge Concerns: Need to establish a safety plan; Medication compliance and effectiveness             -- Discharge Goals: Return home with outpatient referrals for mental health follow-up including medication management/psychotherapy     Christoper Allegra, MD 04/27/2022, 4:25 PM  Total Time Spent in Direct Patient Care:  I  personally spent 35 minutes on the unit in direct patient care. The direct patient care time included face-to-face time with the patient, reviewing the patient's chart, communicating with other professionals, and coordinating care. Greater than 50% of this time was spent in counseling or coordinating care with the patient regarding goals of hospitalization, psycho-education, and discharge planning needs.   Janine Limbo, MD Psychiatrist

## 2022-04-27 NOTE — [facility] Group Notes (Signed)
[place] Group Notes:  (Nursing/MHT/Case Management/Adjunct)  Date:  04/27/2022  Time:  8:21 PM  Type of Therapy:   AA  Participation Level:  Did Not Attend  Participation Quality:   na  Affect:   na  Cognitive:   na  Insight:  None  Engagement in Group:   na  Modes of Intervention:   na  Summary of Progress/Problems: Didn't attend meeting.  Orvan Falconer 04/27/2022, 8:21 PM

## 2022-04-27 NOTE — Progress Notes (Signed)
Alert and oriented x4. Pt hyperverbal this morning, believes people here are out to get her, and endorses auditory hallucinations. PRN medication given. Rates depression 5/10, hopelessness 6/10 and anxiety 3/10. Contracts for safety at this time. Pt was started on Keflex today for her wound on her left forearm.   04/27/22 1000  Psych Admission Type (Psych Patients Only)  Admission Status Voluntary  Psychosocial Assessment  Patient Complaints Anxiety;Depression  Eye Contact Brief  Facial Expression Anxious  Affect Anxious  Speech Rapid  Interaction Childlike  Motor Activity Fidgety  Appearance/Hygiene Unremarkable  Behavior Characteristics Anxious  Mood Depressed  Thought Process  Coherency WDL  Content Blaming self  Delusions Paranoid  Perception Hallucinations  Hallucination Auditory  Judgment Impaired  Confusion None  Danger to Self  Current suicidal ideation? Denies  Self-Injurious Behavior No self-injurious ideation or behavior indicators observed or expressed   Agreement Not to Harm Self Yes  Description of Agreement verbal  Danger to Others  Danger to Others None reported or observed  Danger to Others Abnormal  Harmful Behavior to others No threats or harm toward other people  Destructive Behavior No threats or harm toward property

## 2022-04-27 NOTE — Progress Notes (Signed)
Patient reported to MHT, "I really learned a lot this time, so I shouldn't be coming back here again."

## 2022-04-27 NOTE — Progress Notes (Signed)
Adult Psychoeducational Group Note  Date:  04/27/2022 Time:  4:44 PM  Group Topic/Focus:  Goals Group:   The focus of this group is to help patients establish daily goals to achieve during treatment and discuss how the patient can incorporate goal setting into their daily lives to aide in recovery. Orientation:   The focus of this group is to educate the patient on the purpose and policies of crisis stabilization and provide a format to answer questions about their admission.  The group details unit policies and expectations of patients while admitted.  Participation Level:  Did Not Attend  Participation Quality:    Affect:  Cognitive:    Insight:   Engagement in Group:    Modes of Intervention:    Additional Comments: Pt did not attend goals/orientation group.  Beryle Beams 04/27/2022, 4:44 PM

## 2022-04-27 NOTE — Progress Notes (Signed)
   04/27/22 0615  15 Minute Checks  Location Dayroom  Visual Appearance Calm  Behavior Composed  Sleep (Behavioral Health Patients Only)  Calculate sleep? (Click Yes once per 24 hr at 0600 safety check) Yes  Documented sleep last 24 hours 6.75

## 2022-04-27 NOTE — Progress Notes (Signed)
   04/26/22 2010  Psych Admission Type (Psych Patients Only)  Admission Status Voluntary  Psychosocial Assessment  Patient Complaints Anxiety;Depression  Eye Contact Brief  Facial Expression Anxious  Affect Anxious  Speech Rapid  Interaction Childlike  Motor Activity Fidgety  Appearance/Hygiene Unremarkable  Behavior Characteristics Anxious  Mood Depressed  Thought Process  Coherency WDL  Content Blaming self  Delusions Paranoid  Perception Hallucinations  Hallucination Auditory  Judgment Impaired  Confusion None  Danger to Self  Current suicidal ideation? Denies  Self-Injurious Behavior No self-injurious ideation or behavior indicators observed or expressed   Agreement Not to Harm Self Yes  Description of Agreement Verbally contracts for safety  Danger to Others  Danger to Others None reported or observed  Danger to Others Abnormal  Harmful Behavior to others No threats or harm toward other people  Destructive Behavior No threats or harm toward property

## 2022-04-28 DIAGNOSIS — F250 Schizoaffective disorder, bipolar type: Secondary | ICD-10-CM | POA: Diagnosis not present

## 2022-04-28 MED ORDER — METOPROLOL SUCCINATE ER 25 MG PO TB24
25.0000 mg | ORAL_TABLET | Freq: Every day | ORAL | Status: DC
  Administered 2022-04-29 – 2022-04-30 (×2): 25 mg via ORAL
  Filled 2022-04-28 (×4): qty 1

## 2022-04-28 MED ORDER — IBUPROFEN 400 MG PO TABS
400.0000 mg | ORAL_TABLET | Freq: Four times a day (QID) | ORAL | Status: DC | PRN
  Administered 2022-04-28 – 2022-04-29 (×3): 400 mg via ORAL
  Filled 2022-04-28 (×3): qty 1

## 2022-04-28 NOTE — Group Note (Signed)
Date:  04/28/2022 Time:  2:13 PM  Group Topic/Focus:  Goals Group:   The focus of this group is to help patients establish daily goals to achieve during treatment and discuss how the patient can incorporate goal setting into their daily lives to aide in recovery. Orientation:   The focus of this group is to educate the patient on the purpose and policies of crisis stabilization and provide a format to answer questions about their admission.  The group details unit policies and expectations of patients while admitted.   Participation Level:  Active  Participation Quality:  Appropriate  Affect:  Appropriate  Cognitive:  Appropriate  Insight: Improving  Engagement in Group:  Engaged  Modes of Intervention:  Confrontation  Additional Comments:  Patient attended group and was attentive the duration of it.   Lucylle Foulkes T Ria Comment 04/28/2022, 2:05 PM

## 2022-04-28 NOTE — [facility] Group Notes (Signed)
[facility] Group Notes:  (Nursing/MHT/Case Management/Adjunct)  Date:  04/28/2022  Time:  9:18 PM  Type of Therapy:  Group Therapy  Participation Level:  Active  Participation Quality:  Appropriate  Affect:  Appropriate  Cognitive:  Appropriate  Insight:  Appropriate  Engagement in Group:  Engaged  Modes of Intervention:  Education  Summary of Progress/Problems: Goal to not self harm. Day 7/10.  Orvan Falconer 04/28/2022, 9:18 PM

## 2022-04-28 NOTE — Progress Notes (Signed)
EKG results placed on the outside of pt's shadow chart  Normal sinus rhythm Normal ECG  QT/Qtc-Baz :  392/469 ms

## 2022-04-28 NOTE — Progress Notes (Signed)
   04/28/22 1300  Psych Admission Type (Psych Patients Only)  Admission Status Voluntary  Psychosocial Assessment  Patient Complaints Anxiety  Eye Contact Brief  Facial Expression Anxious  Affect Anxious  Speech Rapid;Pressured  Interaction Childlike;Needy  Motor Activity Fidgety  Appearance/Hygiene Unremarkable  Behavior Characteristics Cooperative;Anxious  Mood Anxious  Thought Process  Coherency WDL  Content Blaming self  Delusions None reported or observed  Perception Hallucinations  Hallucination Auditory  Judgment Impaired  Confusion None  Danger to Self  Current suicidal ideation? Denies  Self-Injurious Behavior No self-injurious ideation or behavior indicators observed or expressed   Agreement Not to Harm Self Yes  Description of Agreement verbal contract  Danger to Others  Danger to Others None reported or observed  Danger to Others Abnormal  Harmful Behavior to others No threats or harm toward other people  Destructive Behavior No threats or harm toward property

## 2022-04-28 NOTE — Progress Notes (Signed)
   04/28/22 0631  15 Minute Checks  Location Hallway  Visual Appearance Calm  Behavior Composed  Sleep (Behavioral Health Patients Only)  Calculate sleep? (Click Yes once per 24 hr at 0600 safety check) Yes  Documented sleep last 24 hours 8.75

## 2022-04-28 NOTE — Progress Notes (Signed)
[facility] MD Progress Note  04/28/2022 12:46 PM [name]  MRN:  [id number]  Subjective:   [name] is a 32 year old female with a psychiatric history of Schizoaffective disorder bipolar type, Anxiety, Autism spectrum disorder, PTSD, Borderline personality disorder, who was admitted to this psychiatric unit for evaluation and treatment after suicide ideation and attempt via cutting self.   Patient was initially seen resting comfortably in bed mid morning, no acute distress.  During evaluation, patient was anxious and minimally engaged with evaluation. Patient continues to endorse passive SI and urge to scratch herself, contracted to safety.  Stated she has not scratched herself within the last 24 hours.  Reported she is still experiencing auditory hallucinations, reported that they are "annoying" and mumbling in quality.  Unable to remember them.  Patient reported "paranoia", however patient declined to talk about why she is paranoid or what she is paranoid of.  Reported that her sleep and appetite are stable and appropriate.  Denied difficulties falling or staying asleep, denied nightmares.  Described her mood as "tired of it and I am annoyed".  Patient declined to give further explanation, despite reframing question multiple times.  Denied any medication side effects, tolerating them well. Patient denied HI/VH.    Principal Problem: Schizoaffective disorder, bipolar type ([place]) Diagnosis: Principal Problem:   Schizoaffective disorder, bipolar type ([place]) Active Problems:   Borderline personality disorder (H[facility])   PTSD (post-traumatic stress disorder)   GAD (generalized anxiety disorder)   Skin erythema  Total Time spent with patient: 15 minutes  Past Psychiatric History:  Previous Psych Diagnoses: Schizoaffective disorder bipolar type, Anxiety, Autism spectrum disorder, PTSD, Borderline personality disorder Prior inpatient treatment: multiple Current/prior outpatient treatment:  [facility] Prior rehab hx: denies multiple Psychotherapy hx: [facility] History of suicide: 20-30 times by overdosing on pills, cutting or drinking 409 disinfectant History of homicide or aggression: denies Psychiatric medication history:  Previously trialed Tegretol, Haldol, Clozaril, Depakote, Invega, Lamictal, Seroquel, asenapine, abilify, vraylar, Lexapro, Zoloft  Neuromodulation history: denies  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 fragile X syndrome    Chronic constipation    Depression    Drug-seeking behavior    Essential tremor    Headache    Overdose of acetaminophen 07/2017   and other meds   Personality disorder ([place])    Schizo-affective psychosis ([place])    Schizoaffective disorder, bipolar type ([place])    Seizures ([place])    Last seizure December 2017   Sleep apnea     Past Surgical History:  Procedure Laterality Date   MOUTH SURGERY  2009 or 2010   Family History:  Family History  Problem Relation Age of Onset   Mental illness Father    Asthma Father    PDD Brother    Seizures Brother    Family Psychiatric  History: See H&P Social History:  Social History   Substance and Sexual Activity  Alcohol Use No   Alcohol/week: 1.0 standard drink of alcohol   Types: 1 Standard drinks or equivalent per week   Comment: denies at this time     Social History   Substance and Sexual Activity  Drug Use No   Comment: History of cocaine use at age 42 for 4 months    Social History   Socioeconomic History   Marital status: Widowed  Spouse name: Not on file   Number of children: 0   Years of education: Not on file   Highest education level: Not on file  Occupational History   Occupation: disability  Tobacco Use   Smoking status: Former    Packs/day: 0    Types: Cigarettes   Smokeless tobacco: Never   Tobacco comments:    Smoked for 2  years age  [age]  Vaping Use   Vaping Use: Never used  Substance and Sexual Activity   Alcohol use: No    Alcohol/week: 1.0 standard drink of alcohol    Types: 1 Standard drinks or equivalent per week    Comment: denies at this time   Drug use: No    Comment: History of cocaine use at age 32 for 4 months   Sexual activity: Not Currently    Birth control/protection: None  Other Topics Concern   Not on file  Social History Narrative   Marital status: Widowed      Children: daughter      Lives: with boyfriend, in two story home      Employment:  Disability      Tobacco: quit smoking; smoked for two years.      Alcohol ;none      Drugs: none   Has not traveled outside of the country.   Right handed         Social Determinants of Health   Financial Resource Strain: Not on file  Food Insecurity: No Food Insecurity (04/19/2022)   Hunger Vital Sign    Worried About Running Out of Food in the Last Year: Never true    Ran Out of Food in the Last Year: Never true  Transportation Needs: Unmet Transportation Needs (04/19/2022)   PRAPARE - Hydrologist (Medical): Yes    Lack of Transportation (Non-Medical): Yes  Physical Activity: Not on file  Stress: Not on file  Social Connections: Not on file   Additional Social History:                           Current Medications: Current Facility-Administered Medications  Medication Dose Route Frequency Provider Last Rate Last Admin   albuterol (VENTOLIN HFA) 108 (90 Base) MCG/ACT inhaler 2 puff  2 puff Inhalation Q6H PRN Motley-Mangrum, Jadeka A, PMHNP   2 puff at 04/21/22 1238   alum & mag hydroxide-simeth (MAALOX/MYLANTA) 200-200-20 MG/5ML suspension 30 mL  30 mL Oral Q4H PRN Motley-Mangrum, Jadeka A, PMHNP   30 mL at 04/27/22 2041   cariprazine (VRAYLAR) capsule 4.5 mg  4.5 mg Oral Daily Massengill, Nathan, MD   4.5 mg at 04/28/22 0753   cephALEXin (KEFLEX) capsule 500 mg  500 mg Oral TID Minda Ditto, RPH    500 mg at 04/28/22 1135   escitalopram (LEXAPRO) tablet 10 mg  10 mg Oral Daily Motley-Mangrum, Jadeka A, PMHNP   10 mg at 04/28/22 0754   hydrOXYzine (ATARAX) tablet 25 mg  25 mg Oral TID PRN Motley-Mangrum, Jadeka A, PMHNP   25 mg at 04/27/22 2030   ibuprofen (ADVIL) tablet 400 mg  400 mg Oral Q6H PRN Attiah, Nadir, MD       magnesium hydroxide (MILK OF MAGNESIA) suspension 30 mL  30 mL Oral Daily PRN Motley-Mangrum, Jadeka A, PMHNP       melatonin tablet 3 mg  3 mg Oral QHS Motley-Mangrum, Jadeka A, PMHNP   3 mg at  04/27/22 2030   metoprolol succinate (TOPROL-XL) 24 hr tablet 12.5 mg  12.5 mg Oral Daily Motley-Mangrum, Jadeka A, PMHNP   12.5 mg at 04/28/22 0754   mometasone-formoterol (DULERA) 200-5 MCG/ACT inhaler 2 puff  2 puff Inhalation BID Lindell Spar I, NP   2 puff at 04/28/22 0753   neomycin-bacitracin-polymyxin [id number] OINT 1 Application  1 Application Topical Daily PRN Onuoha, Chinwendu V, NP   1 Application at [id number]   OLANZapine (ZYPREXA) injection 5 mg  5 mg Intramuscular TID PRN Massengill, Ovid Curd, MD       OLANZapine zydis (ZYPREXA) disintegrating tablet 5 mg  5 mg Oral TID PRN Massengill, Ovid Curd, MD   5 mg at 04/28/22 1059   pantoprazole (PROTONIX) EC tablet 40 mg  40 mg Oral Daily Motley-Mangrum, Jadeka A, PMHNP   40 mg at 04/28/22 0754   prazosin (MINIPRESS) capsule 1 mg  1 mg Oral QHS Motley-Mangrum, Jadeka A, PMHNP   1 mg at 04/27/22 2030   pregabalin (LYRICA) capsule 100 mg  100 mg Oral TID Motley-Mangrum, Jadeka A, PMHNP   100 mg at 04/28/22 1135   traZODone (DESYREL) tablet 50 mg  50 mg Oral QHS PRN Motley-Mangrum, Jadeka A, PMHNP   50 mg at 04/27/22 2030    Lab Results: No results found for this or any previous visit (from the past 48 hour(s)).  Blood Alcohol level:  Lab Results  Component Value Date   ETH <10 04/18/2022   ETH <10 [id number]    Metabolic Disorder Labs: Lab Results  Component Value Date   HGBA1C 5.2 04/20/2022   MPG 103 04/20/2022    MPG 96.8 10/07/2021   Lab Results  Component Value Date   PROLACTIN 66.2 (H) 03/02/2021   PROLACTIN 66.1 (H) 02/04/2020   Lab Results  Component Value Date   CHOL 191 04/20/2022   TRIG 163 (H) 04/20/2022   HDL 52 04/20/2022   CHOLHDL 3.7 04/20/2022   VLDL 33 04/20/2022   LDLCALC 106 (H) 04/20/2022   LDLCALC 102 (H) 12/10/2021    Physical Findings: AIMS:  , ,  ,  ,      Musculoskeletal: Strength & Muscle Tone: within normal limits Gait & Station: normal Patient leans: N/A  Psychiatric Specialty Exam:  Presentation  General Appearance:  Casual  Eye Contact: Fair  Speech: Normal Rate  Speech Volume: Normal  Handedness: Right   Mood and Affect  Mood: Anxious; Dysphoric  Affect: Depressed; Restricted   Thought Process  Thought Processes: Linear  Descriptions of Associations:Intact  Orientation:Full (Time, Place and Person)  Thought Content:Logical  History of Schizophrenia/Schizoaffective disorder:Yes  Duration of Psychotic Symptoms:Greater than six months  Hallucinations:No data recorded  Ideas of Reference:None  Suicidal Thoughts:Suicidal Thoughts: yes, passive w/o intent or plan   Homicidal Thoughts:No data recorded   Sensorium  Memory: Immediate Fair; Recent Fair; Remote Fair  Judgment: Impaired  Insight: Lacking   Executive Functions  Concentration: Fair  Attention Span: Fair  Recall: Good  Fund of Knowledge: Good  Language: Good   Psychomotor Activity  Psychomotor Activity: No data recorded   Assets  Assets: Communication Skills; Resilience; Social Support; Physical Health   Sleep  Sleep: No data recorded    Physical Exam: Physical Exam Vitals reviewed.  Pulmonary:     Effort: Pulmonary effort is normal.  Neurological:     Mental Status: She is alert.     Motor: No weakness.     Gait: Gait normal.    Review of Systems  Constitutional:  Positive for malaise/fatigue. Negative for  chills and fever.  Respiratory:  Negative for shortness of breath.   Cardiovascular:  Negative for chest pain and palpitations.  Gastrointestinal:  Negative for abdominal pain.  Neurological:  Negative for dizziness, tingling, tremors and headaches.   Blood pressure 116/71, pulse (!) 122, temperature 98.4 F (36.9 C), temperature source Oral, resp. rate 18, height 5\' 4"  (1.626 m), weight 90.7 kg, SpO2 93 %. Body mass index is 34.33 kg/m.   Treatment Plan Summary:  Patient's heart rate continues to be elevated, increasing metoprolol per below.  Received increased dose of Vraylar 2/23, no other med changes at this time.  ASSESSMENT:   Diagnoses / Active Problems: Schizoaffective d/o BP type GAD PTSD Borderline PD H/o autism?    PLAN: Safety and Monitoring:             --  Voluntary admission to inpatient psychiatric unit for safety, stabilization and treatment             -- Daily contact with patient to assess and evaluate symptoms and progress in treatment             -- Patient's case to be discussed in multi-disciplinary team meeting             -- Observation Level : q15 minute checks             -- Vital signs:  q12 hours             -- Precautions: suicide, elopement, and assault   2. Psychiatric Diagnoses and Treatment:               -Continued vraylar 4.5 qd for schizoaffective do -continue lexapro 10 mg qd for gad, ptsd -continue prazosin 1 mg qhs for ptsd nightmares -continue lyrica 100 mg tid for fibromyalgia (outpt med) -continue metoprolol ER 12.5 mg qd for tachycarida       --  The risks/benefits/side-effects/alternatives to this medication were discussed in detail with the patient and time was given for questions. The patient consents to medication trial.                -- Metabolic profile and EKG monitoring obtained while on an atypical antipsychotic (BMI: Lipid Panel: HbgA1c: QTc:)              -- Encouraged patient to participate in unit milieu and in  scheduled group therapies                          3. Medical Issues Being Addressed:          Metoprolol for tachycardia-obtain EKG, increasing home metoprolol 12.5 mg to 25 mg daily Lyrica for fibromyalgia Pantoprazole for reflux   4. Discharge Planning:              -- Social work and case management to assist with discharge planning and identification of hospital follow-up needs prior to discharge             -- Estimated LOS: 2-3 more days             -- Discharge Concerns: Need to establish a safety plan; Medication compliance and effectiveness             -- Discharge Goals: Return home with outpatient referrals for mental health follow-up including medication management/psychotherapy   Discharge planning for Monday, the patient feels she is not safe for discharge until  she has CST team that can meet her at the apartment, which can only be done on the weekdays.  Merrily Brittle, DO 04/28/2022, 12:46 PM

## 2022-04-29 DIAGNOSIS — F250 Schizoaffective disorder, bipolar type: Secondary | ICD-10-CM | POA: Diagnosis not present

## 2022-04-29 NOTE — Progress Notes (Signed)
D: Pt expressed experiencing auditory hallucinations. Pt states that the "voices are mumbling" that she is not safe and that her family and staff are out to get her. Provided reassurance to pt that she is safe and that we are here to support her. Pt endorsed SI with plan to use a pillow case to choke self. Pt verbalized that she could not contract for safety.    A: Pt was offered support and encouragement. Pt was given scheduled medications. Q 15 minute checks were done for safety. Informed pt that if she were unable to contract for safety, pt safety would need to be maintained by taking to quiet room.   R:Pt attended group. Pt is taking medication. Safety maintained on unit with Q 15 minute checks.

## 2022-04-29 NOTE — [facility] Group Notes (Signed)
[facility] Group Notes:  (Nursing/MHT/Case Management/Adjunct)  Date:  04/29/2022  Time:  9:04 PM  Type of Therapy:   group therapy  Participation Level:  Active  Participation Quality:  Appropriate  Affect:  Appropriate  Cognitive:  Appropriate  Insight:  Appropriate  Engagement in Group:  Engaged  Modes of Intervention:  Education  Summary of Progress/Problems: Goal work on D/C plans. Day 3/10.  Orvan Falconer 04/29/2022, 9:04 PM

## 2022-04-29 NOTE — Progress Notes (Signed)
Pt came to staff stating she wanted to hurt herself, pt stated she wanted to take a pillow case and choke herself. Pt was taken to the quiet room and pt began yelling, pt continues to try to sabotage her D/C for tomorrow. At this time staff will keep and eye on pt while awake and monitor if pt needs to be on a 1:1 , will continue to assess.

## 2022-04-29 NOTE — Progress Notes (Signed)
[facility] MD Progress Note  04/29/2022 11:26 AM [name]  MRN:  [id number]  Subjective:   [name] is a 32 year old female with a psychiatric history of Schizoaffective disorder bipolar type, Anxiety, Autism spectrum disorder, PTSD, Borderline personality disorder, who was admitted to this psychiatric unit for evaluation and treatment after suicide ideation and attempt via cutting self.   Overnight events: No acute events.   Patient reported that she is not having any suicidal thoughts or self injurious thoughts, last time was yesterday.  Reported that her auditory hallucinations are improving, they are not as loud.  Reported that her sleep is interrupted due to her fibromyalgia, and that she is tired this morning.  Denied body pain at this time, although is having a headache.  Reported her appetite is appropriate and stable.  During evaluation, patient was minimally engaged, with irritable tone, would repeatedly answer questions with, "why does it matter, I am going home tomorrow". She denied HI/VH, paranoia.  She denied side effects to medication, reporting she feels less restless after increasing metoprolol.    Principal Problem: Schizoaffective disorder, bipolar type ([place]) Diagnosis: Principal Problem:   Schizoaffective disorder, bipolar type ([place]) Active Problems:   Borderline personality disorder (HCC)   PTSD (post-traumatic stress disorder)   GAD (generalized anxiety disorder)   Skin erythema  Total Time spent with patient: 15 minutes  Past Psychiatric History:  Previous Psych Diagnoses: Schizoaffective disorder bipolar type, Anxiety, Autism spectrum disorder, PTSD, Borderline personality disorder Prior inpatient treatment: multiple Current/prior outpatient treatment: [facility] Prior rehab hx: denies multiple Psychotherapy hx: [facility] History of suicide: 20-30 times by overdosing on pills, cutting or drinking 409 disinfectant History of homicide or aggression:  denies Psychiatric medication history:  Previously trialed Tegretol, Haldol, Clozaril, Depakote, Invega, Lamictal, Seroquel, asenapine, abilify, vraylar, Lexapro, Zoloft  Neuromodulation history: denies  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 fragile X syndrome    Chronic constipation    Depression    Drug-seeking behavior    Essential tremor    Headache    Overdose of acetaminophen 07/2017   and other meds   Personality disorder ([place])    Schizo-affective psychosis ([place])    Schizoaffective disorder, bipolar type ([place])    Seizures ([place])    Last seizure December 2017   Sleep apnea     Past Surgical History:  Procedure Laterality Date   MOUTH SURGERY  2009 or 2010   Family History:  Family History  Problem Relation Age of Onset   Mental illness Father    Asthma Father    PDD Brother    Seizures Brother    Family Psychiatric  History: See H&P Social History:  Social History   Substance and Sexual Activity  Alcohol Use No   Alcohol/week: 1.0 standard drink of alcohol   Types: 1 Standard drinks or equivalent per week   Comment: denies at this time     Social History   Substance and Sexual Activity  Drug Use No   Comment: History of cocaine use at age 35 for 4 months    Social History   Socioeconomic History   Marital status: Widowed    Spouse name: Not on file   Number of children: 0   Years of education: Not on file   Highest education level: Not on file  Occupational History   Occupation: disability  Tobacco Use   Smoking status: Former    Packs/day: 0    Types: Cigarettes   Smokeless tobacco: Never   Tobacco comments:    Smoked for 2  years age [age]  Vaping Use   Vaping Use: Never used  Substance and Sexual Activity   Alcohol use: No    Alcohol/week: 1.0 standard drink of alcohol    Types: 1 Standard drinks or equivalent per week     Comment: denies at this time   Drug use: No    Comment: History of cocaine use at age 79 for 4 months   Sexual activity: Not Currently    Birth control/protection: None  Other Topics Concern   Not on file  Social History Narrative   Marital status: Widowed      Children: daughter      Lives: with boyfriend, in two story home      Employment:  Disability      Tobacco: quit smoking; smoked for two years.      Alcohol ;none      Drugs: none   Has not traveled outside of the country.   Right handed         Social Determinants of Health   Financial Resource Strain: Not on file  Food Insecurity: No Food Insecurity (04/19/2022)   Hunger Vital Sign    Worried About Running Out of Food in the Last Year: Never true    Ran Out of Food in the Last Year: Never true  Transportation Needs: Unmet Transportation Needs (04/19/2022)   PRAPARE - Hydrologist (Medical): Yes    Lack of Transportation (Non-Medical): Yes  Physical Activity: Not on file  Stress: Not on file  Social Connections: Not on file   Additional Social History:                           Current Medications: Current Facility-Administered Medications  Medication Dose Route Frequency Provider Last Rate Last Admin   albuterol (VENTOLIN HFA) 108 (90 Base) MCG/ACT inhaler 2 puff  2 puff Inhalation Q6H PRN Motley-Mangrum, Jadeka A, PMHNP   2 puff at 04/21/22 1238   alum & mag hydroxide-simeth (MAALOX/MYLANTA) 200-200-20 MG/5ML suspension 30 mL  30 mL Oral Q4H PRN Motley-Mangrum, Jadeka A, PMHNP   30 mL at 04/27/22 2041   cariprazine (VRAYLAR) capsule 4.5 mg  4.5 mg Oral Daily Massengill, Nathan, MD   4.5 mg at 04/29/22 0731   cephALEXin (KEFLEX) capsule 500 mg  500 mg Oral TID Minda Ditto, RPH   500 mg at 04/29/22 0732   escitalopram (LEXAPRO) tablet 10 mg  10 mg Oral Daily Motley-Mangrum, Jadeka A, PMHNP   10 mg at 04/29/22 0732   hydrOXYzine (ATARAX) tablet 25 mg  25 mg Oral TID PRN  Motley-Mangrum, Jadeka A, PMHNP   25 mg at 04/29/22 0547   ibuprofen (ADVIL) tablet 400 mg  400 mg Oral Q6H PRN Winfred Leeds, Nadir, MD   400 mg at 04/28/22 1951   magnesium hydroxide (MILK OF MAGNESIA) suspension 30 mL  30 mL Oral Daily PRN Motley-Mangrum, Jadeka A, PMHNP       melatonin tablet 3 mg  3 mg Oral QHS Motley-Mangrum, Jadeka A, PMHNP   3 mg at 04/28/22 2112   metoprolol succinate (TOPROL-XL) 24 hr tablet 25 mg  25 mg Oral Daily Merrily Brittle, DO   25 mg at  04/29/22 0733   mometasone-formoterol (DULERA) 200-5 MCG/ACT inhaler 2 puff  2 puff Inhalation BID Lindell Spar I, NP   2 puff at 04/29/22 0733   neomycin-bacitracin-polymyxin [id number] OINT 1 Application  1 Application Topical Daily PRN Onuoha, Chinwendu V, NP   1 Application at Q000111Q 1544   OLANZapine (ZYPREXA) injection 5 mg  5 mg Intramuscular TID PRN Massengill, Ovid Curd, MD       OLANZapine zydis (ZYPREXA) disintegrating tablet 5 mg  5 mg Oral TID PRN Massengill, Ovid Curd, MD   5 mg at 04/28/22 1059   pantoprazole (PROTONIX) EC tablet 40 mg  40 mg Oral Daily Motley-Mangrum, Jadeka A, PMHNP   40 mg at 04/29/22 0733   prazosin (MINIPRESS) capsule 1 mg  1 mg Oral QHS Motley-Mangrum, Jadeka A, PMHNP   1 mg at 04/28/22 2111   pregabalin (LYRICA) capsule 100 mg  100 mg Oral TID Motley-Mangrum, Jadeka A, PMHNP   100 mg at 04/29/22 1114   traZODone (DESYREL) tablet 50 mg  50 mg Oral QHS PRN Motley-Mangrum, Jadeka A, PMHNP   50 mg at 04/28/22 2112    Lab Results: No results found for this or any previous visit (from the past 48 hour(s)).  Blood Alcohol level:  Lab Results  Component Value Date   ETH <10 04/18/2022   ETH <10 Q000111Q    Metabolic Disorder Labs: Lab Results  Component Value Date   HGBA1C 5.2 04/20/2022   MPG 103 04/20/2022   MPG 96.8 10/07/2021   Lab Results  Component Value Date   PROLACTIN 66.2 (H) 03/02/2021   PROLACTIN 66.1 (H) 02/04/2020   Lab Results  Component Value Date   CHOL 191 04/20/2022    TRIG 163 (H) 04/20/2022   HDL 52 04/20/2022   CHOLHDL 3.7 04/20/2022   VLDL 33 04/20/2022   LDLCALC 106 (H) 04/20/2022   LDLCALC 102 (H) 12/10/2021    Physical Findings: AIMS: Facial and Oral Movements Muscles of Facial Expression: None, normal Lips and Perioral Area: None, normal Jaw: None, normal Tongue: None, normal,Extremity Movements Upper (arms, wrists, hands, fingers): None, normal Lower (legs, knees, ankles, toes): None, normal, Trunk Movements Neck, shoulders, hips: None, normal, Overall Severity Severity of abnormal movements (highest score from questions above): None, normal Incapacitation due to abnormal movements: None, normal Patient's awareness of abnormal movements (rate only patient's report): No Awareness, Dental Status Current problems with teeth and/or dentures?: No Does patient usually wear dentures?: No    Musculoskeletal: Strength & Muscle Tone: within normal limits Gait & Station: normal Patient leans: N/A  Psychiatric Specialty Exam:  Presentation  General Appearance:  Disheveled  Eye Contact: None  Speech: Clear and Coherent; Normal Rate  Speech Volume: Decreased  Handedness: Right   Mood and Affect  Mood: Anxious; Dysphoric; Irritable  Affect: Appropriate; Congruent; Constricted   Thought Process  Thought Processes: Goal Directed  Descriptions of Associations:Intact  Orientation:Full (Time, Place and Person)  Thought Content:Rumination; Perseveration  History of Schizophrenia/Schizoaffective disorder:Yes  Duration of Psychotic Symptoms:Greater than six months  Hallucinations:Hallucinations: None Description of Auditory Hallucinations: Mumbling of voices, patient is unable to say what they are saying.  None commanding   Ideas of Reference:None  Suicidal Thoughts:Suicidal Thoughts: Denied  Homicidal Thoughts:Homicidal Thoughts: No    Sensorium  Memory: Immediate  Fair  Judgment: Impaired  Insight: Shallow   Executive Functions  Concentration: Good  Attention Span: Good  Recall: Good  Fund of Knowledge: Good  Language: Good   Psychomotor Activity  Psychomotor Activity: Psychomotor  Activity: Normal    Assets  Assets: Communication Skills; Desire for Improvement; Leisure Time; Resilience; Talents/Skills; Housing   Sleep  Sleep: Sleep: Poor     Physical Exam: Physical Exam Vitals reviewed.  Pulmonary:     Effort: Pulmonary effort is normal.  Neurological:     Mental Status: She is alert.     Motor: No weakness.     Gait: Gait normal.    Review of Systems  Constitutional:  Positive for malaise/fatigue. Negative for chills and fever.  Respiratory:  Negative for shortness of breath.   Cardiovascular:  Negative for chest pain and palpitations.  Gastrointestinal:  Negative for abdominal pain.  Neurological:  Negative for dizziness, tingling, tremors and headaches.   Blood pressure 116/65, pulse (!) 101, temperature 98.4 F (36.9 C), temperature source Oral, resp. rate 18, height 5\' 4"  (1.626 m), weight 90.7 kg, SpO2 99 %. Body mass index is 34.33 kg/m.   Treatment Plan Summary:  Heart rate has improved with increase metoprolol, patient reported feeling less anxious as well.  However currently patient is dysphoric because of discharge on Monday.  ASSESSMENT:   Diagnoses / Active Problems: Schizoaffective d/o BP type GAD PTSD Borderline PD H/o autism?    PLAN: Safety and Monitoring:             -- Voluntary admission to inpatient psychiatric unit for safety, stabilization and treatment             -- Daily contact with patient to assess and evaluate symptoms and progress in treatment             -- Patient's case to be discussed in multi-disciplinary team meeting             -- Observation Level : q15 minute checks             -- Vital signs:  q12 hours             -- Precautions: suicide, elopement,  and assault   2. Psychiatric Diagnoses and Treatment:               -Continued vraylar 4.5 qd for schizoaffective do -continue lexapro 10 mg qd for gad, ptsd -continue prazosin 1 mg qhs for ptsd nightmares -continue lyrica 100 mg tid for fibromyalgia (outpt med) -continue metoprolol ER 25 mg qd for tachycarida       --  The risks/benefits/side-effects/alternatives to this medication were discussed in detail with the patient and time was given for questions. The patient consents to medication trial.                -- Metabolic profile and EKG monitoring obtained while on an atypical antipsychotic (BMI: Lipid Panel: HbgA1c: QTc:)              -- Encouraged patient to participate in unit milieu and in scheduled group therapies                          3. Medical Issues Being Addressed:  Metoprolol for tachycardia Continued home metoprolol 25 mg daily (home dose 12.5mg ) EKG NSR, Qtc 469 Lyrica for fibromyalgia Pantoprazole for reflux   4. Discharge Planning:              -- Social work and case management to assist with discharge planning and identification of hospital follow-up needs prior to discharge             -- Estimated LOS:  2-3 more days             -- Discharge Concerns: Need to establish a safety plan; Medication compliance and effectiveness             -- Discharge Goals: Return home with outpatient referrals for mental health follow-up including medication management/psychotherapy   Discharge planning for Monday, the patient feels she is not safe for discharge until she has CST team that can meet her at the apartment, which can only be done on the weekdays.  Merrily Brittle, DO 04/29/2022, 11:26 AM

## 2022-04-29 NOTE — Progress Notes (Signed)
   04/29/22 0555  15 Minute Checks  Location Bedroom  Visual Appearance Calm  Behavior Composed  Sleep (Behavioral Health Patients Only)  Calculate sleep? (Click Yes once per 24 hr at 0600 safety check) Yes  Documented sleep last 24 hours 6

## 2022-04-29 NOTE — Group Note (Signed)
Date:  04/29/2022 Time:  5:03 PM  Group Topic/Focus:  Spirituality:   The focus of this group is to discuss how one's spirituality can aide in recovery.    Participation Level:  Minimal  Participation Quality:  Attentive  Affect:  Appropriate  Cognitive:  Appropriate  Insight: Appropriate  Engagement in Group:  Limited  Modes of Intervention:  Discussion  Additional Comments:     Jerrye Beavers 04/29/2022, 5:03 PM

## 2022-04-29 NOTE — Group Note (Signed)
Date:  04/29/2022 Time:  9:27 AM  Group Topic/Focus:  Dimensions of Wellness:   The focus of this group is to introduce the topic of wellness and discuss the role each dimension of wellness plays in total health.    Participation Level:  Active  Participation Quality:  Appropriate  Affect:  Appropriate  Cognitive:  Appropriate  Insight: Appropriate  Engagement in Group:  Engaged  Modes of Intervention:  Discussion  Additional Comments:     Jerrye Beavers 04/29/2022, 9:27 AM

## 2022-04-29 NOTE — Progress Notes (Signed)
Spoke with Pt regarding previous comments she had made about harming herself.  Pt angry states "I'm not going to do anything!"  Pt laid down on bed, states "leave me alone"  Staff will continue to monitor closely.

## 2022-04-29 NOTE — Plan of Care (Signed)
  Problem: Education: Goal: Mental status will improve Outcome: Progressing Goal: Verbalization of understanding the information provided will improve Outcome: Progressing   Problem: Coping: Goal: Ability to verbalize frustrations and anger appropriately will improve Outcome: Progressing   Problem: Safety: Goal: Periods of time without injury will increase Outcome: Progressing   Problem: Education: Goal: Utilization of techniques to improve thought processes will improve Outcome: Progressing   Problem: Coping: Goal: Coping ability will improve Outcome: Progressing

## 2022-04-29 NOTE — Progress Notes (Signed)
   04/28/22 2100  Psych Admission Type (Psych Patients Only)  Admission Status Voluntary  Psychosocial Assessment  Patient Complaints Anxiety;Depression  Eye Contact Brief  Facial Expression Anxious  Affect Anxious  Speech Rapid;Pressured  Interaction Childlike;Needy  Motor Activity Fidgety  Appearance/Hygiene Unremarkable  Behavior Characteristics Cooperative;Anxious  Mood Anxious  Thought Process  Coherency WDL  Content Blaming self  Delusions None reported or observed  Perception Hallucinations  Hallucination Auditory  Judgment Impaired  Confusion None  Danger to Self  Current suicidal ideation? Denies  Self-Injurious Behavior No self-injurious ideation or behavior indicators observed or expressed   Agreement Not to Harm Self Yes  Description of Agreement verbal  Danger to Others  Danger to Others None reported or observed  Danger to Others Abnormal  Harmful Behavior to others No threats or harm toward other people  Destructive Behavior No threats or harm toward property

## 2022-04-29 NOTE — Group Note (Signed)
Date:  04/29/2022 Time:  9:20 AM  Group Topic/Focus:  Orientation:   The focus of this group is to educate the patient on the purpose and policies of crisis stabilization and provide a format to answer questions about their admission.  The group details unit policies and expectations of patients while admitted.    Participation Level:  Active  Participation Quality:  Appropriate  Affect:  Appropriate  Cognitive:  Appropriate  Insight: Appropriate  Engagement in Group:  Engaged  Modes of Intervention:  Discussion  Additional Comments:     Jerrye Beavers 04/29/2022, 9:20 AM

## 2022-04-29 NOTE — Progress Notes (Signed)
Pt presented calm and cooperative beginning of the shift, denied SI and reported that [facility] were improving. Around 1400, pt heard yelling in her room. Upon approach, pt presented agitated, complaining that voices were getting worse, and that she felt people were talking about her. Pt given emotional support and prn zyprexa. An hour later pt calmer, apologized to staff for yelling. Pt currently denies SI/HI and contracts for safety.

## 2022-04-29 NOTE — Group Note (Signed)
Date:  04/29/2022 Time:  4:10 PM  Group Topic/Focus:  Personal Choices and Values:   The focus of this group is to help patients assess and explore the importance of values in their lives, how their values affect their decisions, how they express their values and what opposes their expression.    Participation Level:  Active  Participation Quality:  Attentive  Affect:  Appropriate  Cognitive:  Appropriate  Insight: Lacking  Engagement in Group:  Engaged  Modes of Intervention:  Exploration  Additional Comments:     Jerrye Beavers 04/29/2022, 4:10 PM

## 2022-04-30 DIAGNOSIS — F319 Bipolar disorder, unspecified: Secondary | ICD-10-CM | POA: Diagnosis not present

## 2022-04-30 MED ORDER — OLANZAPINE 5 MG PO TABS: 5.0000 mg | ORAL_TABLET | Freq: Every day | ORAL | 0 refills | Status: DC | PRN

## 2022-04-30 MED ORDER — TRAZODONE HCL 50 MG PO TABS: 50.0000 mg | ORAL_TABLET | Freq: Every evening | ORAL | 0 refills | Status: DC | PRN

## 2022-04-30 MED ORDER — CEPHALEXIN 500 MG PO CAPS: 500.0000 mg | ORAL_CAPSULE | Freq: Three times a day (TID) | ORAL | 0 refills | Status: AC

## 2022-04-30 MED ORDER — METOPROLOL SUCCINATE ER 25 MG PO TB24: 25.0000 mg | ORAL_TABLET | Freq: Every day | ORAL | 0 refills | Status: DC

## 2022-04-30 MED ORDER — CARIPRAZINE HCL 4.5 MG PO CAPS: 4.5000 mg | ORAL_CAPSULE | Freq: Every day | ORAL | 0 refills | Status: AC

## 2022-04-30 MED ORDER — HYDROXYZINE HCL 25 MG PO TABS: ORAL_TABLET | ORAL | 0 refills | Status: DC

## 2022-04-30 MED ORDER — OLANZAPINE 5 MG PO TABS: 5.0000 mg | ORAL_TABLET | Freq: Every day | ORAL | Status: DC | PRN

## 2022-04-30 NOTE — Progress Notes (Signed)
Discharge note: RN met with pt and reviewed pt's discharge instructions. Pt verbalized understanding of discharge instructions and pt did not have any questions. RN reviewed and provided pt with a copy of Suicide Safety Plan, SRA, AVS and Transition Record. RN returned pt's belongings to pt. Medication samples were given to patient.   Pt denied SI/HI/AVH and voiced no concerns. Pt was appreciative of the care pt received at [facility]. Patient discharged to the lobby without incident.

## 2022-04-30 NOTE — Group Note (Signed)
Date:  04/30/2022 Time:  10:52 AM  Group Topic/Focus:  Orientation:   The focus of this group is to educate the patient on the purpose and policies of crisis stabilization and provide a format to answer questions about their admission.  The group details unit policies and expectations of patients while admitted.    Participation Level:  Active  Participation Quality:  Appropriate  Affect:  Appropriate  Cognitive:  Appropriate  Insight: Appropriate  Engagement in Group:  Engaged  Modes of Intervention:  Discussion  Additional Comments:     Jerrye Beavers 04/30/2022, 10:52 AM

## 2022-04-30 NOTE — Progress Notes (Signed)
Pt noted to be sleeping, respirations even and unlabored, since 2254.  Will continue to monitor closely.

## 2022-04-30 NOTE — Discharge Summary (Signed)
Physician Discharge Summary Note  Patient:  [name] is an 32 y.o., female MRN:  [id number] DOB:  Sep 19, 1990 Patient phone:  [phone] (home)  Patient address:   [place] [address],  Total Time spent with patient: 15 minutes  Date of Admission:  04/19/2022 Date of Discharge: 04-29-2021  Reason for Admission:   [name] is a 32 year old female with a psychiatric history of Schizoaffective disorder bipolar type, Anxiety, Autism spectrum disorder, PTSD, Borderline personality disorder, who was admitted to this psychiatric unit for evaluation and treatment after suicide ideation and attempt via cutting self.   Principal Problem: Schizoaffective disorder, bipolar type [facility]) Discharge Diagnoses: Principal Problem:   Schizoaffective disorder, bipolar type ([place]) Active Problems:   Borderline personality disorder (HCC)   PTSD (post-traumatic stress disorder)   GAD (generalized anxiety disorder)   Skin erythema   Past Psychiatric History:  Previous Psych Diagnoses: Schizoaffective disorder bipolar type, Anxiety, Autism spectrum disorder, PTSD, Borderline personality disorder Prior inpatient treatment: multiple Current/prior outpatient treatment: [facility] Prior rehab hx: denies multiple Psychotherapy hx: [facility] History of suicide: 20-30 times by overdosing on pills, cutting or drinking 409 disinfectant History of homicide or aggression: denies Psychiatric medication history:  Previously trialed Tegretol, Haldol, Clozaril, Depakote, Invega, Lamictal, Seroquel, asenapine, abilify, vraylar, Lexapro, Zoloft  Neuromodulation history: denies  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 fragile X syndrome    Chronic constipation    Depression    Drug-seeking behavior    Essential tremor    Headache    Overdose of acetaminophen  07/2017   and other meds   Personality disorder ([place])    Schizo-affective psychosis ([place])    Schizoaffective disorder, bipolar type ([place])    Seizures ([place])    Last seizure December 2017   Sleep apnea     Past Surgical History:  Procedure Laterality Date   MOUTH SURGERY  2009 or 2010   Family History:  Family History  Problem Relation Age of Onset   Mental illness Father    Asthma Father    PDD Brother    Seizures Brother    Family Psychiatric  History:  Medical: denies  Psych: Father has PTSD, Mother and Brother has Autism, Brother has depression  Psych Rx: SA/HA: denies  Substance use family hx: denies   Social History:  Social History   Substance and Sexual Activity  Alcohol Use No   Alcohol/week: 1.0 standard drink of alcohol   Types: 1 Standard drinks or equivalent per week   Comment: denies at this time     Social History   Substance and Sexual Activity  Drug Use No   Comment: History of cocaine use at age 52 for 4 months    Social History   Socioeconomic History   Marital status: Widowed    Spouse name: Not on file   Number of children: 0   Years of education: Not on file   Highest education level: Not on file  Occupational History   Occupation: disability  Tobacco Use   Smoking status: Former    Packs/day: 0    Types: Cigarettes   Smokeless tobacco: Never   Tobacco comments:    Smoked for 2  years age [age]  Vaping Use   Vaping Use: Never used  Substance and Sexual  Activity   Alcohol use: No    Alcohol/week: 1.0 standard drink of alcohol    Types: 1 Standard drinks or equivalent per week    Comment: denies at this time   Drug use: No    Comment: History of cocaine use at age 19 for 4 months   Sexual activity: Not Currently    Birth control/protection: None  Other Topics Concern   Not on file  Social History Narrative   Marital status: Widowed      Children: daughter      Lives: with boyfriend, in two story home      Employment:   Disability      Tobacco: quit smoking; smoked for two years.      Alcohol ;none      Drugs: none   Has not traveled outside of the country.   Right handed         Social Determinants of Health   Financial Resource Strain: Not on file  Food Insecurity: No Food Insecurity (04/19/2022)   Hunger Vital Sign    Worried About Running Out of Food in the Last Year: Never true    Ran Out of Food in the Last Year: Never true  Transportation Needs: Unmet Transportation Needs (04/19/2022)   PRAPARE - Hydrologist (Medical): Yes    Lack of Transportation (Non-Medical): Yes  Physical Activity: Not on file  Stress: Not on file  Social Connections: Not on file    [place]:  During the patient's hospitalization, patient had extensive initial psychiatric evaluation, and follow-up psychiatric evaluations every day.  Psychiatric diagnoses provided upon initial assessment:  Schizoaffective d/o BP type GAD PTSD Borderline PD H/o autism?   Patient's psychiatric medications were adjusted on admission:  -Continue vraylar 3 mg qd for schizoaffective do -continue lexapro 10 mg qd for gad, ptsd -continue prazosin 1 mg qhs for ptsd nightmares -continue lyrica 100 mg tid for fibromyalgia (outpt med) -continue metoprolol ER 12.5 mg qd for tachycarida   During the hospitalization, other adjustments were made to the patient's psychiatric medication regimen:  -vraylar was increased to 4.5 mg once daily  -metoprolol was increased to ER 25 mg once daily  Patient's care was discussed during the interdisciplinary team meeting every day during the hospitalization.  The patient denied having side effects to prescribed psychiatric medication. Pt reports that tongue movements and feet movements are due to anxiety and are not involuntary movements.   Gradually, patient started adjusting to milieu. The patient was evaluated each day by a clinical provider  to ascertain response to treatment. Improvement was noted by the patient's report of decreasing symptoms, improved sleep and appetite, affect, medication tolerance, behavior, and participation in unit programming.  Patient was asked each day to complete a self inventory noting mood, mental status, pain, new symptoms, anxiety and concerns.    Symptoms were reported as significantly decreased or resolved completely by discharge.   On day of discharge, the patient reports that their mood is stable. The patient reports that suicidal thoughts are at baseline in terms of frequency, intensity. On the day of dc, the pt denies having any SI. Denies having any suicide intent or plan.  Patient denies having homicidal thoughts.  Patient denies having auditory hallucinations on the day of discharge. Yesterday, pt was able to utilize coping skills and prn medication when she was feeling overwhelmed and having SI.  Patient denies any visual hallucinations or other  symptoms of psychosis. The patient was motivated to continue taking medication with a goal of continued improvement in mental health.   The patient reports their target psychiatric symptoms of suicidal thoughts, paranoia, depressed mood, and command AH, all responded well to the psychiatric medications, and the patient reports overall benefit other psychiatric hospitalization. Supportive psychotherapy was provided to the patient. The patient also participated in regular group therapy while hospitalized. Coping skills, problem solving as well as relaxation therapies were also part of the unit programming.  Labs were reviewed with the patient, and abnormal results were discussed with the patient.  The patient is able to verbalize their individual safety plan to this provider.  # It is recommended to the patient to continue psychiatric medications as prescribed, after discharge from the hospital.    # It is recommended to the patient to follow up with your  outpatient psychiatric provider and PCP.  # It was discussed with the patient, the impact of alcohol, drugs, tobacco have been there overall psychiatric and medical wellbeing, and total abstinence from substance use was recommended the patient.ed.  # Prescriptions provided or sent directly to preferred pharmacy at discharge. Patient agreeable to plan. Given opportunity to ask questions. Appears to feel comfortable with discharge.    # In the event of worsening symptoms, the patient is instructed to call the crisis hotline, 911 and or go to the nearest ED for appropriate evaluation and treatment of symptoms. To follow-up with primary care provider for other medical issues, concerns and or health care needs  # Patient was discharged to home, to care of CST team, with a plan to follow up as noted below.   Physical Findings: AIMS: Facial and Oral Movements Muscles of Facial Expression: None, normal Lips and Perioral Area: None, normal Jaw: None, normal Tongue: some mild movements, 1 Upper (arms, wrists, hands, fingers): None, normal Lower (legs, knees, ankles, toes): b/l feet writhing, 1  Neck, shoulders, hips: None, normal, Overall Severity Severity of abnormal movements (highest score from questions above): None, normal Incapacitation due to abnormal movements: None, normal Patient's awareness of abnormal movements (rate only patient's report): No Awareness, Dental Status Current problems with teeth and/or dentures?: No Does patient usually wear dentures?: No  Total: 2   CIWA:    COWS:     No stiffness, rigidity, tremor  Pt attributes tongue and feet movements to anxiety and denies these are involuntary movements.   Musculoskeletal: Strength & Muscle Tone: within normal limits Gait & Station: normal Patient leans: N/A   Psychiatric Specialty Exam:  Presentation  General Appearance:  Appropriate for Environment; Casual; Fairly Groomed  Eye Contact: Good  Speech: Normal  Rate; Clear and Coherent  Speech Volume: Normal  Handedness: Right   Mood and Affect  Mood: Anxious  Affect: Appropriate; Congruent; Full Range   Thought Process  Thought Processes: Linear  Descriptions of Associations:Intact  Orientation:Full (Time, Place and Person)  Thought Content:Logical  History of Schizophrenia/Schizoaffective disorder:Yes  Duration of Psychotic Symptoms:Greater than six months  Hallucinations:Hallucinations: Auditory Description of Auditory Hallucinations: off and on, none at time of discharge. no command AH.  Ideas of Reference:None  Suicidal Thoughts:Suicidal Thoughts: No SI Active Intent and/or Plan: -- (.) SI Passive Intent and/or Plan: -- (.)  Homicidal Thoughts:Homicidal Thoughts: No   Sensorium  Memory: Immediate Good; Recent Good; Remote Good  Judgment: Fair  Insight: Fair   Materials engineer: Fair  Attention Span: Fair  Recall: Good  Fund of Knowledge: Good  Language: Good   Psychomotor Activity  Psychomotor Activity: Psychomotor Activity: Restlessness   Assets  Assets: Communication Skills; Desire for Improvement; Leisure Time; Resilience; Talents/Skills; Housing   Sleep  Sleep: Sleep: Fair    Physical Exam: Physical Exam Vitals reviewed.  Pulmonary:     Effort: Pulmonary effort is normal.  Neurological:     Mental Status: She is alert.     Motor: No weakness.     Gait: Gait normal.    Review of Systems  Constitutional:  Negative for chills and fever.  Cardiovascular:  Negative for chest pain and palpitations.  Neurological:  Negative for dizziness, tingling, tremors and headaches.  Psychiatric/Behavioral:  Negative for depression, hallucinations, memory loss, substance abuse and suicidal ideas. The patient is nervous/anxious. The patient does not have insomnia.   All other systems reviewed and are negative.  Blood pressure 133/72, pulse 85, temperature 98.2 F  (36.8 C), resp. rate 18, height 5\' 4"  (1.626 m), weight 90.7 kg, SpO2 97 %. Body mass index is 34.33 kg/m.   Social History   Tobacco Use  Smoking Status Former   Packs/day: 0   Types: Cigarettes  Smokeless Tobacco Never  Tobacco Comments   Smoked for 2  years age [age]   Tobacco Cessation:  N/A, patient does not currently use tobacco products   Blood Alcohol level:  Lab Results  Component Value Date   ETH <10 04/18/2022   ETH <10 [id number]    Metabolic Disorder Labs:  Lab Results  Component Value Date   HGBA1C 5.2 04/20/2022   MPG 103 04/20/2022   MPG 96.8 10/07/2021   Lab Results  Component Value Date   PROLACTIN 66.2 (H) 03/02/2021   PROLACTIN 66.1 (H) 02/04/2020   Lab Results  Component Value Date   CHOL 191 04/20/2022   TRIG 163 (H) 04/20/2022   HDL 52 04/20/2022   CHOLHDL 3.7 04/20/2022   VLDL 33 04/20/2022   LDLCALC 106 (H) 04/20/2022   LDLCALC 102 (H) 12/10/2021    See Psychiatric Specialty Exam and Suicide Risk Assessment completed by Attending Physician prior to discharge.  Discharge destination:  Home  Is patient on multiple antipsychotic therapies at discharge:  Yes,   Do you recommend tapering to monotherapy for antipsychotics?  Yes   Has Patient had three or more failed trials of antipsychotic monotherapy by history:  Yes,   Antipsychotic medications that previously failed include:   1.  seroquel., 2.  Vraylar monotherapy ., and 3.  haldol.  Recommended Plan for Multiple Antipsychotic Therapies: Taper to monotherapy as described:  taper off of zyprexa prn so that pt is only on vraylar monotherapy   Discharge Instructions     Diet - low sodium heart healthy   Complete by: As directed    Increase activity slowly   Complete by: As directed       Allergies as of 04/30/2022       Reactions   Bee Venom Anaphylaxis   Coconut Flavor Anaphylaxis, Rash   Fish Allergy Anaphylaxis   Geodon [ziprasidone Hcl] Other (See Comments)   Pt states  that this medication causes paralysis of the mouth.     Haloperidol And Related Other (See Comments)   Pt states that this medication causes paralysis of the mouth, jaw locks up   Lithobid [lithium] Other (See Comments)   Seizure-like activity   Roxicodone [oxycodone] Other (See Comments)   Hallucinations    Seroquel [quetiapine] Other (See Comments)   Severe drowsiness Pt currently taking  12.5 mg BID, she is ok with this dose   Shellfish Allergy Anaphylaxis   Phenergan [promethazine Hcl] Other (See Comments)   Chest pain    Prilosec [omeprazole] Nausea And Vomiting   Pt can take protonix with no problems   Sulfa Antibiotics Other (See Comments)   Chest pain    Tegretol [carbamazepine] Nausea And Vomiting   Prozac [fluoxetine] Other (See Comments)   Increased Depression Suicidal thoughts   Tape Other (See Comments)   Skin tears, can only tolerate paper tape.   Tylenol [acetaminophen] Rash   Rash on face         Medication List     STOP taking these medications    cyclobenzaprine 10 MG tablet Commonly known as: FLEXERIL   melatonin 3 MG Tabs tablet   vitamin B-12 100 MCG tablet Commonly known as: CYANOCOBALAMIN       TAKE these medications      Indication  albuterol 108 (90 Base) MCG/ACT inhaler Commonly known as: VENTOLIN HFA Inhale 2 puffs into the lungs every 6 (six) hours as needed for wheezing or shortness of breath.  Indication: Asthma   Breyna 160-4.5 MCG/ACT inhaler Generic drug: budesonide-formoterol Inhale into the lungs.  Indication: Asthma   Cariprazine HCl 4.5 MG Caps Take 1 capsule (4.5 mg total) by mouth daily for 7 days. Start taking on: May 01, 2022 What changed:  medication strength how much to take  Indication: Schizophrenia   cephALEXin 500 MG capsule Commonly known as: KEFLEX Take 1 capsule (500 mg total) by mouth 3 (three) times daily for 6 doses.  Indication: Infection of the Skin and/or Skin Structures   escitalopram 10  MG tablet Commonly known as: LEXAPRO Take 1 tablet (10 mg total) by mouth daily for 14 days. What changed: Another medication with the same name was removed. Continue taking this medication, and follow the directions you see here.  Indication: Major Depressive Disorder   hydrOXYzine 25 MG tablet Commonly known as: ATARAX Take 25 mg twice daily by mouth as needed for anxiety. Take 100 mg by mouth for sleep as needed at bedtime.  Indication: Feeling Anxious, Insomnia   metoprolol succinate 25 MG 24 hr tablet Commonly known as: TOPROL-XL Take 1 tablet (25 mg total) by mouth daily for 7 days. Start taking on: May 01, 2022 What changed:  how much to take Another medication with the same name was removed. Continue taking this medication, and follow the directions you see here.  Indication: tachycardia   OLANZapine 5 MG tablet Commonly known as: ZYPREXA Take 1 tablet (5 mg total) by mouth daily as needed for up to 7 days (anxiety).  Indication: Schizophrenia, severe anxiety, halluciantions   pantoprazole 40 MG tablet Commonly known as: PROTONIX Take 1 tablet (40 mg total) by mouth daily for 14 days. What changed: Another medication with the same name was removed. Continue taking this medication, and follow the directions you see here.  Indication: Gastroesophageal Reflux Disease   prazosin 1 MG capsule Commonly known as: MINIPRESS Take 1 capsule (1 mg total) by mouth at bedtime for 14 days. What changed: Another medication with the same name was removed. Continue taking this medication, and follow the directions you see here.  Indication: Frightening Dreams   pregabalin 100 MG capsule Commonly known as: LYRICA Take 100 mg by mouth 3 (three) times daily.  Indication: Generalized Anxiety Disorder, Neuropathic Pain   traZODone 50 MG tablet Commonly known as: DESYREL Take 1 tablet (50 mg total) by  mouth at bedtime as needed for up to 7 days for sleep.  Indication: Trouble  Sleeping   Voltaren 1 % Gel Generic drug: diclofenac Sodium Apply 2 g topically 4 (four) times daily.  Indication: Joint Damage causing Pain and Loss of Function        Follow-up Information     [facility]. Call.   Why: Please call your CST Team and inform them of your discharge from the  hospital. You can also reach Butch Penny, therapist, at [phone] or Jeanie at [phone]. Contact information: [place] [address] [place], [place]. Call.   Why: Please call to schedule an appointment for medication management, and/or therapy services. Contact information: [place] [address] [phone]                 Follow-up recommendations:    Activity: as tolerated  Diet: heart healthy  Other: -Follow-up with your outpatient psychiatric provider -instructions on appointment date, time, and address (location) are provided to you in discharge paperwork.  -Take your psychiatric medications as prescribed at discharge - instructions are provided to you in the discharge paperwork  -Follow-up with outpatient primary care doctor and other specialists -for management of preventative medicine and chronic medical disease.   -Recommend abstinence from alcohol, tobacco, and other illicit drug use at discharge.   -If your psychiatric symptoms recur, worsen, or if you have side effects to your psychiatric medications, call your outpatient psychiatric provider, 911, [phone] or go to the nearest emergency department.  -If suicidal thoughts recur, call your outpatient psychiatric provider, 911, 988 or go to the nearest emergency department.   Signed: Christoper Allegra, MD 04/30/2022, 8:16 AM  Total Time Spent in Direct Patient Care:  I personally spent 35 minutes on the unit in direct patient care. The direct patient care time included face-to-face time with the patient, reviewing the patient's chart,  communicating with other professionals, and coordinating care. Greater than 50% of this time was spent in counseling or coordinating care with the patient regarding goals of hospitalization, psycho-education, and discharge planning needs.   Janine Limbo, MD Psychiatrist

## 2022-04-30 NOTE — Plan of Care (Signed)
  Problem: Safety: Goal: Ability to disclose and discuss suicidal ideas will improve Outcome: Progressing   Problem: Coping: Goal: Ability to verbalize frustrations and anger appropriately will improve Outcome: Not Progressing

## 2022-04-30 NOTE — Discharge Instructions (Signed)
-  Follow-up with your outpatient psychiatric provider -instructions on appointment date, time, and address (location) are provided to you in discharge paperwork.  -Take your psychiatric medications as prescribed at discharge - instructions are provided to you in the discharge paperwork You are gett 7 day supply of medications at discharge. Please have your cst team manage the distribution of your medications.   -Follow-up with outpatient primary care doctor and other specialists -for management of preventative medicine and any chronic medical disease.  -Recommend abstinence from alcohol, tobacco, and other illicit drug use at discharge.   -If your psychiatric symptoms recur, worsen, or if you have side effects to your psychiatric medications, call your outpatient psychiatric provider, 911, [phone] or go to the nearest emergency department.  -If suicidal thoughts occur, call your outpatient psychiatric provider, 911, 988 or go to the nearest emergency department.  Naloxone (Narcan) can help reverse an overdose when given to the victim quickly.  [facility] offers free naloxone kits and instructions/training on its use.  Add naloxone to your first aid kit and you can help save a life.   Pick up your free kit at the following locations:   [place]:  [place], [place] [address] [phone]) [facility] [place] [id number] [phone]) [facility] [place]  [place]: [place] [id number] [place] [id number] [phone]) [facility] [place] [address] [phone])

## 2022-04-30 NOTE — BHH Suicide Risk Assessment (Signed)
[facility] Discharge Suicide Risk Assessment   Principal Problem: Schizoaffective disorder, bipolar type [facility]) Discharge Diagnoses: Principal Problem:   Schizoaffective disorder, bipolar type ([place]) Active Problems:   Borderline personality disorder (HCC)   PTSD (post-traumatic stress disorder)   GAD (generalized anxiety disorder)   Skin erythema   Total Time spent with patient: 15 minutes  [name] is a 32 year old female with a psychiatric history of Schizoaffective disorder bipolar type, Anxiety, Autism spectrum disorder, PTSD, Borderline personality disorder, who was admitted to this psychiatric unit for evaluation and treatment after suicide ideation and attempt via cutting self.    During the patient's hospitalization, patient had extensive initial psychiatric evaluation, and follow-up psychiatric evaluations every day.   Psychiatric diagnoses provided upon initial assessment:  Schizoaffective d/o BP type GAD PTSD Borderline PD H/o autism?    Patient's psychiatric medications were adjusted on admission:  -Continue vraylar 3 mg qd for schizoaffective do -continue lexapro 10 mg qd for gad, ptsd -continue prazosin 1 mg qhs for ptsd nightmares -continue lyrica 100 mg tid for fibromyalgia (outpt med) -continue metoprolol ER 12.5 mg qd for tachycarida    During the hospitalization, other adjustments were made to the patient's psychiatric medication regimen:  -vraylar was increased to 4.5 mg once daily  -metoprolol was increased to ER 25 mg once daily   Patient's care was discussed during the interdisciplinary team meeting every day during the hospitalization.   The patient denied having side effects to prescribed psychiatric medication. Pt reports that tongue movements and feet movements are due to anxiety and are not involuntary movements.    Gradually, patient started adjusting to milieu. The patient was evaluated each day by a clinical provider to ascertain response to  treatment. Improvement was noted by the patient's report of decreasing symptoms, improved sleep and appetite, affect, medication tolerance, behavior, and participation in unit programming.  Patient was asked each day to complete a self inventory noting mood, mental status, pain, new symptoms, anxiety and concerns.     Symptoms were reported as significantly decreased or resolved completely by discharge.    On day of discharge, the patient reports that their mood is stable. The patient reports that suicidal thoughts are at baseline in terms of frequency, intensity. On the day of dc, the pt denies having any SI. Denies having any suicide intent or plan.  Patient denies having homicidal thoughts.  Patient denies having auditory hallucinations on the day of discharge. Yesterday, pt was able to utilize coping skills and prn medication when she was feeling overwhelmed and having SI.  Patient denies any visual hallucinations or other symptoms of psychosis. The patient was motivated to continue taking medication with a goal of continued improvement in mental health.    The patient reports their target psychiatric symptoms of suicidal thoughts, paranoia, depressed mood, and command AH, all responded well to the psychiatric medications, and the patient reports overall benefit other psychiatric hospitalization. Supportive psychotherapy was provided to the patient. The patient also participated in regular group therapy while hospitalized. Coping skills, problem solving as well as relaxation therapies were also part of the unit programming.   Labs were reviewed with the patient, and abnormal results were discussed with the patient.   The patient is able to verbalize their individual safety plan to this provider.   # It is recommended to the patient to continue psychiatric medications as prescribed, after discharge from the hospital.     # It is recommended to the patient to follow up  with your outpatient  psychiatric provider and PCP.   # It was discussed with the patient, the impact of alcohol, drugs, tobacco have been there overall psychiatric and medical wellbeing, and total abstinence from substance use was recommended the patient.ed.   # Prescriptions provided or sent directly to preferred pharmacy at discharge. Patient agreeable to plan. Given opportunity to ask questions. Appears to feel comfortable with discharge.    # In the event of worsening symptoms, the patient is instructed to call the crisis hotline, 911 and or go to the nearest ED for appropriate evaluation and treatment of symptoms. To follow-up with primary care provider for other medical issues, concerns and or health care needs   # Patient was discharged to home, to care of CST team, with a plan to follow up as noted below.   Psychiatric Specialty Exam  Presentation  General Appearance:  Appropriate for Environment; Casual; Fairly Groomed  Eye Contact: Good  Speech: Normal Rate; Clear and Coherent  Speech Volume: Normal  Handedness: Right   Mood and Affect  Mood: Anxious  Duration of Depression Symptoms: Greater than two weeks  Affect: Appropriate; Congruent; Full Range   Thought Process  Thought Processes: Linear  Descriptions of Associations:Intact  Orientation:Full (Time, Place and Person)  Thought Content:Logical  History of Schizophrenia/Schizoaffective disorder:Yes  Duration of Psychotic Symptoms:Greater than six months  Hallucinations:Hallucinations: Auditory Description of Auditory Hallucinations: off and on, none at time of discharge. no command AH.  Ideas of Reference:None  Suicidal Thoughts:Suicidal Thoughts: No SI Active Intent and/or Plan: -- (.) SI Passive Intent and/or Plan: -- (.)  Homicidal Thoughts:Homicidal Thoughts: No   Sensorium  Memory: Immediate Good; Recent Good; Remote Good  Judgment: Fair  Insight: Fair   Community education officer   Concentration: Fair  Attention Span: Fair  Recall: Good  Fund of Knowledge: Good  Language: Good   Psychomotor Activity  Psychomotor Activity: Psychomotor Activity: Restlessness   Assets  Assets: Communication Skills; Desire for Improvement; Leisure Time; Resilience; Talents/Skills; Housing   Sleep  Sleep: Sleep: Fair   Physical Exam: Physical Exam See discharge summary  ROS See discharge summary  Blood pressure 133/72, pulse 85, temperature 98.2 F (36.8 C), resp. rate 18, height 5\' 4"  (1.626 m), weight 90.7 kg, SpO2 97 %. Body mass index is 34.33 kg/m.  Mental Status Per Nursing Assessment::   On Admission:  NA  Demographic factors:  Divorced or widowed, Caucasian, Low socioeconomic status, Unemployed Loss Factors:  NA Historical Factors:  Prior suicide attempts, Impulsivity Risk Reduction Factors:  NA  Continued Clinical Symptoms:  AH at baseline. Suicidal thoughts, chronic, at baseline, not occurring now.   Cognitive Features That Contribute To Risk:  None    Suicide Risk:  Mild:  There are no identifiable suicide plans, no associated intent, mild dysphoria and related symptoms, good self-control (both objective and subjective assessment), few other risk factors, and identifiable protective factors, including available and accessible social support.    Follow-up Information     [facility]. Call.   Why: Please call your CST Team and inform them of your discharge from the  hospital. You can also reach Butch Penny, therapist, at [phone] or Jeanie at [phone]. Contact information: [place] [address] [place], [place]. Call.   Why: Please call to schedule an appointment for medication management, and/or therapy services. Contact information: [place] [address] [phone]  Plan Of Care/Follow-up recommendations:    Activity: as  tolerated   Diet: heart healthy   Other: -Follow-up with your outpatient psychiatric provider -instructions on appointment date, time, and address (location) are provided to you in discharge paperwork.   -Take your psychiatric medications as prescribed at discharge - instructions are provided to you in the discharge paperwork   -Follow-up with outpatient primary care doctor and other specialists -for management of preventative medicine and chronic medical disease.    -Recommend abstinence from alcohol, tobacco, and other illicit drug use at discharge.    -If your psychiatric symptoms recur, worsen, or if you have side effects to your psychiatric medications, call your outpatient psychiatric provider, 911, [phone] or go to the nearest emergency department.   -If suicidal thoughts recur, call your outpatient psychiatric provider, 911, [phone] or go to the nearest emergency department.    Christoper Allegra, MD 04/30/2022, 8:27 AM

## 2022-05-02 ENCOUNTER — Encounter (HOSPITAL_COMMUNITY): Payer: Self-pay

## 2022-05-02 ENCOUNTER — Emergency Department (HOSPITAL_COMMUNITY)
Admission: EM | Admit: 2022-05-02 | Discharge: 2022-05-03 | Disposition: A | Discharge status: Home / Self Care | Payer: Medicare PPO | Attending: Emergency Medicine | Admitting: Emergency Medicine

## 2022-05-02 DIAGNOSIS — R443 Hallucinations, unspecified: Secondary | ICD-10-CM | POA: Insufficient documentation

## 2022-05-02 DIAGNOSIS — Z1152 Encounter for screening for COVID-19: Secondary | ICD-10-CM | POA: Insufficient documentation

## 2022-05-02 DIAGNOSIS — F314 Bipolar disorder, current episode depressed, severe, without psychotic features: Secondary | ICD-10-CM | POA: Diagnosis not present

## 2022-05-02 DIAGNOSIS — R519 Headache, unspecified: Secondary | ICD-10-CM | POA: Insufficient documentation

## 2022-05-02 DIAGNOSIS — F603 Borderline personality disorder: Secondary | ICD-10-CM | POA: Insufficient documentation

## 2022-05-02 DIAGNOSIS — F329 Major depressive disorder, single episode, unspecified: Secondary | ICD-10-CM | POA: Diagnosis not present

## 2022-05-02 DIAGNOSIS — F840 Autistic disorder: Secondary | ICD-10-CM | POA: Insufficient documentation

## 2022-05-02 DIAGNOSIS — Z87891 Personal history of nicotine dependence: Secondary | ICD-10-CM | POA: Insufficient documentation

## 2022-05-02 DIAGNOSIS — F29 Unspecified psychosis not due to a substance or known physiological condition: Secondary | ICD-10-CM | POA: Insufficient documentation

## 2022-05-02 DIAGNOSIS — Z7951 Long term (current) use of inhaled steroids: Secondary | ICD-10-CM | POA: Diagnosis not present

## 2022-05-02 DIAGNOSIS — R45851 Suicidal ideations: Secondary | ICD-10-CM

## 2022-05-02 DIAGNOSIS — J45909 Unspecified asthma, uncomplicated: Secondary | ICD-10-CM | POA: Diagnosis not present

## 2022-05-02 LAB — CBC WITH DIFFERENTIAL/PLATELET
Abs Immature Granulocytes: 0.03 10*3/uL (ref 0.00–0.07)
Basophils Absolute: 0.1 10*3/uL (ref 0.0–0.1)
Basophils Relative: 1 %
Eosinophils Absolute: 0.1 10*3/uL (ref 0.0–0.5)
Eosinophils Relative: 1 %
HCT: 38.2 % (ref 36.0–46.0)
Hemoglobin: 11.9 g/dL — ABNORMAL LOW (ref 12.0–15.0)
Immature Granulocytes: 0 %
Lymphocytes Relative: 33 %
Lymphs Abs: 3.2 10*3/uL (ref 0.7–4.0)
MCH: 25.3 pg — ABNORMAL LOW (ref 26.0–34.0)
MCHC: 31.2 g/dL (ref 30.0–36.0)
MCV: 81.3 fL (ref 80.0–100.0)
Monocytes Absolute: 0.5 10*3/uL (ref 0.1–1.0)
Monocytes Relative: 5 %
Neutro Abs: 5.9 10*3/uL (ref 1.7–7.7)
Neutrophils Relative %: 60 %
Platelets: 320 10*3/uL (ref 150–400)
RBC: 4.7 MIL/uL (ref 3.87–5.11)
RDW: 14.6 % (ref 11.5–15.5)
WBC: 9.8 10*3/uL (ref 4.0–10.5)
nRBC: 0 % (ref 0.0–0.2)

## 2022-05-02 LAB — COMPREHENSIVE METABOLIC PANEL
ALT: 22 U/L (ref 0–44)
AST: 21 U/L (ref 15–41)
Albumin: 4.1 g/dL (ref 3.5–5.0)
Alkaline Phosphatase: 68 U/L (ref 38–126)
Anion gap: 9 (ref 5–15)
BUN: 14 mg/dL (ref 6–20)
CO2: 20 mmol/L — ABNORMAL LOW (ref 22–32)
Calcium: 8.7 mg/dL — ABNORMAL LOW (ref 8.9–10.3)
Chloride: 111 mmol/L (ref 98–111)
Creatinine, Ser: 0.65 mg/dL (ref 0.44–1.00)
GFR, Estimated: >60 mL/min (ref >60)
Glucose, Bld: 112 mg/dL — ABNORMAL HIGH (ref 70–99)
Potassium: 3.6 mmol/L (ref 3.5–5.1)
Sodium: 140 mmol/L (ref 135–145)
Total Bilirubin: 0.5 mg/dL (ref 0.3–1.2)
Total Protein: 7.5 g/dL (ref 6.5–8.1)

## 2022-05-02 LAB — SARS CORONAVIRUS 2 BY RT PCR: SARS Coronavirus 2 by RT PCR: NEGATIVE

## 2022-05-02 LAB — I-STAT BETA HCG BLOOD, ED (MC, WL, AP ONLY): I-stat hCG, quantitative: <5 m[IU]/mL (ref <5)

## 2022-05-02 LAB — ETHANOL: Alcohol, Ethyl (B): <10 mg/dL (ref <10)

## 2022-05-02 MED ORDER — CARIPRAZINE HCL 1.5 MG PO CAPS
4.5000 mg | ORAL_CAPSULE | Freq: Every day | ORAL | Status: DC
  Administered 2022-05-03: 4.5 mg via ORAL
  Filled 2022-05-02: qty 3

## 2022-05-02 MED ORDER — HYDROXYZINE HCL 25 MG PO TABS: 25.0000 mg | ORAL_TABLET | Freq: Two times a day (BID) | ORAL | Status: DC | PRN

## 2022-05-02 MED ORDER — IBUPROFEN 200 MG PO TABS
600.0000 mg | ORAL_TABLET | Freq: Three times a day (TID) | ORAL | Status: DC | PRN
  Administered 2022-05-02: 600 mg via ORAL
  Filled 2022-05-02: qty 3

## 2022-05-02 MED ORDER — OLANZAPINE 5 MG PO TABS
5.0000 mg | ORAL_TABLET | Freq: Every day | ORAL | Status: DC | PRN
  Administered 2022-05-02: 5 mg via ORAL
  Filled 2022-05-02: qty 1

## 2022-05-02 MED ORDER — TRAZODONE HCL 50 MG PO TABS
50.0000 mg | ORAL_TABLET | Freq: Every evening | ORAL | Status: DC | PRN
  Administered 2022-05-02: 50 mg via ORAL
  Filled 2022-05-02: qty 1

## 2022-05-02 MED ORDER — PREGABALIN 50 MG PO CAPS
100.0000 mg | ORAL_CAPSULE | Freq: Three times a day (TID) | ORAL | Status: DC
  Administered 2022-05-03 (×2): 100 mg via ORAL
  Filled 2022-05-02 (×2): qty 2

## 2022-05-02 MED ORDER — METOPROLOL SUCCINATE ER 50 MG PO TB24
25.0000 mg | ORAL_TABLET | Freq: Every day | ORAL | Status: DC
  Administered 2022-05-03: 25 mg via ORAL
  Filled 2022-05-02: qty 1

## 2022-05-02 NOTE — ED Provider Notes (Signed)
[place] EMERGENCY DEPARTMENT AT [facility] Provider Note   CSN: [id number] Arrival date & time: 05/02/22  2109     History {Add pertinent medical, surgical, social history, OB history to HPI:1} Chief Complaint  Patient presents with   Suicidal    [name] is a 32 y.o. female.  She is here with complaint of suicidal thoughts that she might overdose.  Voices are telling her to kill herself.  That her fibromyalgia pain has been acting up and that causes her to feel suicidal.  She was just admitted to behavioral health, she said she would [facility].  She is also complaining of a headache that started this morning.  She has not tried anything for that.  Denies any overdose of her medications.  She would like to get her nightly meds as soon as possible.  The history is provided by the patient.  Mental Health Problem Presenting symptoms: hallucinations and suicidal thoughts   Onset quality:  Gradual Duration:  1 day Timing:  Constant Progression:  Worsening Chronicity:  Recurrent Treatment compliance:  All of the time Relieved by:  Nothing Worsened by:  Nothing Associated symptoms: headaches   Associated symptoms: no abdominal pain and no chest pain   Risk factors: hx of mental illness        Home Medications Prior to Admission medications   Medication Sig Start Date End Date Taking? Authorizing Provider  albuterol (VENTOLIN HFA) 108 (90 Base) MCG/ACT inhaler Inhale 2 puffs into the lungs every 6 (six) hours as needed for wheezing or shortness of breath.    [provider]  BREYNA 160-4.5 MCG/ACT inhaler Inhale into the lungs. 04/18/22   [provider]  cariprazine 4.5 MG CAPS Take 1 capsule (4.5 mg total) by mouth daily for 7 days. 05/01/22 05/08/22  Massengill, Ovid Curd, MD  cephALEXin (KEFLEX) 500 MG capsule Take 1 capsule (500 mg total) by mouth 3 (three) times daily for 6 doses. 04/30/22 05/02/22  Massengill,  Ovid Curd, MD  diclofenac Sodium (VOLTAREN) 1 % GEL Apply 2 g topically 4 (four) times daily.    [provider]  escitalopram (LEXAPRO) 10 MG tablet Take 1 tablet (10 mg total) by mouth daily for 14 days. 02/07/22 02/22/22  Massengill, Ovid Curd, MD  hydrOXYzine (ATARAX) 25 MG tablet Take 25 mg twice daily by mouth as needed for anxiety. Take 100 mg by mouth for sleep as needed at bedtime. 04/30/22   Massengill, Ovid Curd, MD  metoprolol succinate (TOPROL-XL) 25 MG 24 hr tablet Take 1 tablet (25 mg total) by mouth daily for 7 days. 05/01/22 05/08/22  Massengill, Ovid Curd, MD  OLANZapine (ZYPREXA) 5 MG tablet Take 1 tablet (5 mg total) by mouth daily as needed for up to 7 days (anxiety). 04/30/22 05/07/22  Massengill, Ovid Curd, MD  pantoprazole (PROTONIX) 40 MG tablet Take 1 tablet (40 mg total) by mouth daily for 14 days. 02/07/22 02/22/22  Massengill, Ovid Curd, MD  prazosin (MINIPRESS) 1 MG capsule Take 1 capsule (1 mg total) by mouth at bedtime for 14 days. 02/06/22 02/22/22  Massengill, Ovid Curd, MD  pregabalin (LYRICA) 100 MG capsule Take 100 mg by mouth 3 (three) times daily.    [provider]  traZODone (DESYREL) 50 MG tablet Take 1 tablet (50 mg total) by mouth at bedtime as needed for up to 7 days for sleep. 04/30/22 05/07/22  Janine Limbo, MD      Allergies    Bee venom, Coconut flavor, Fish  allergy, Geodon [ziprasidone hcl], Haloperidol and related, Lithobid [lithium], Roxicodone [oxycodone], Seroquel [quetiapine], Shellfish allergy, Phenergan [promethazine hcl], Prilosec [omeprazole], Sulfa antibiotics, Tegretol [carbamazepine], Prozac [fluoxetine], Tape, and Tylenol [acetaminophen]    Review of Systems   Review of Systems  Constitutional:  Negative for fever.  Eyes:  Negative for visual disturbance.  Cardiovascular:  Negative for chest pain.  Gastrointestinal:  Negative for abdominal pain.  Neurological:  Positive for headaches.  Psychiatric/Behavioral:  Positive for hallucinations and  suicidal ideas.     Physical Exam Updated Vital Signs BP (!) 130/110   Pulse 98   Temp 98.4 F (36.9 C) (Oral)   Resp 18   Ht 5\' 5"  (1.651 m)   Wt 87 kg   LMP 04/25/2022 (Exact Date)   SpO2 96%   BMI 31.92 kg/m  Physical Exam Vitals and nursing note reviewed.  Constitutional:      General: She is not in acute distress.    Appearance: Normal appearance. She is well-developed.  HENT:     Head: Normocephalic and atraumatic.  Eyes:     Conjunctiva/sclera: Conjunctivae normal.  Cardiovascular:     Rate and Rhythm: Normal rate and regular rhythm.     Heart sounds: No murmur heard. Pulmonary:     Effort: Pulmonary effort is normal. No respiratory distress.     Breath sounds: Normal breath sounds.  Abdominal:     Palpations: Abdomen is soft.     Tenderness: There is no abdominal tenderness. There is no guarding or rebound.  Musculoskeletal:        General: No deformity.     Cervical back: Neck supple.  Skin:    General: Skin is warm and dry.     Capillary Refill: Capillary refill takes less than 2 seconds.  Neurological:     General: No focal deficit present.     Mental Status: She is alert.     ED Results / Procedures / Treatments   Labs (all labs ordered are listed, but only abnormal results are displayed) Labs Reviewed  SARS CORONAVIRUS 2 BY RT PCR  COMPREHENSIVE METABOLIC PANEL  ETHANOL  RAPID URINE DRUG SCREEN, HOSP PERFORMED  CBC WITH DIFFERENTIAL/PLATELET  I-STAT BETA HCG BLOOD, ED ([facility], [facility], [facility] ONLY)    EKG None  Radiology No results found.  Procedures Procedures  {Document cardiac monitor, telemetry assessment procedure when appropriate:1}  Medications Ordered in ED Medications - No data to display  ED Course/ Medical Decision Making/ A&P   {   Click here for ABCD2, HEART and other calculatorsREFRESH Note before signing :1}                          Medical Decision Making Amount and/or Complexity of Data Reviewed Labs: ordered.   This  patient complains of ***; this involves an extensive number of treatment Options and is a complaint that carries with it a high risk of complications and morbidity. The differential includes ***  I ordered, reviewed and interpreted labs, which included *** I ordered medication *** and reviewed PMP when indicated. I ordered imaging studies which included *** and I independently    visualized and interpreted imaging which showed *** Additional history obtained from *** Previous records obtained and reviewed *** I consulted *** and discussed lab and imaging findings and discussed disposition.  Cardiac monitoring reviewed, *** Social determinants considered, *** Critical Interventions: ***  After the interventions stated above, I reevaluated the patient and found *** Admission  and further testing considered, ***   {Document critical care time when appropriate:1} {Document review of labs and clinical decision tools ie heart score, Chads2Vasc2 etc:1}  {Document your independent review of radiology images, and any outside records:1} {Document your discussion with family members, caretakers, and with consultants:1} {Document social determinants of health affecting pt's care:1} {Document your decision making why or why not admission, treatments were needed:1} Final Clinical Impression(s) / ED Diagnoses Final diagnoses:  None    Rx / DC Orders ED Discharge Orders     None

## 2022-05-02 NOTE — ED Provider Notes (Signed)
Patient states that she wants to leave, does not feel that she is getting any help while she is here.  She states that she still has auditory hallucinations but they are improving.  However, given suicidal thoughts and auditory command hallucinations, I do not feel she is safe for discharge and I am placing her under involuntary commitment.   Delora Fuel, MD [phone]  Following placing her under involuntary commitment, patient started taking at her skin and biting her skin and attempt at self-harm.  I have ordered a dose of hydroxyzine.  Patient did stop taking it her skin when she was offered a meal, but she is now playing her head against the wall and attempts at self-harm.  She is intolerant of haloperidol and ziprasidone.  I have ordered a dose of lorazepam.  I am ordering restraints for her per her protection.  CRITICAL CARE Performed by: Delora Fuel Total critical care time: 50 minutes Critical care time was exclusive of separately billable procedures and treating other patients. Critical care was necessary to treat or prevent imminent or life-threatening deterioration. Critical care was time spent personally by me on the following activities: development of treatment plan with patient and/or surrogate as well as nursing, discussions with consultants, evaluation of patient's response to treatment, examination of patient, obtaining history from patient or surrogate, ordering and performing treatments and interventions, ordering and review of laboratory studies, ordering and review of radiographic studies, pulse oximetry and re-evaluation of patient's condition.   Delora Fuel, MD [id number] [phone]

## 2022-05-02 NOTE — ED Notes (Signed)
Pt changed into burgundy scrubs. Pt has two(2) belonging bags and one black duffle bag located at nurses station. Security called to wand pt.

## 2022-05-02 NOTE — ED Notes (Signed)
Pt ambulated to hallway C without incident. Pt belongings transferred to nurses station in cabinet labeled "[address] and [address]". Pt's black duffle bag is located under desk.

## 2022-05-02 NOTE — ED Triage Notes (Signed)
Pt arrived with [facility] for recurrent SI thoughts with hallucinations. Pt reports plan to OD on pills, pt reports the "voices are telling me to kill myself" Pt st she [facility]. No new self-inflicted injuries since last week per pt. Pt calm and cooperative at current, NAD noted.

## 2022-05-03 ENCOUNTER — Emergency Department (EMERGENCY_DEPARTMENT_HOSPITAL)
Admission: EM | Admit: 2022-05-03 | Discharge: 2022-05-04 | Disposition: A | Discharge status: Other Acute Inpatient Hospital | Payer: Medicare PPO | Source: Home / Self Care | Attending: Emergency Medicine | Admitting: Emergency Medicine

## 2022-05-03 ENCOUNTER — Other Ambulatory Visit: Payer: Self-pay

## 2022-05-03 DIAGNOSIS — F603 Borderline personality disorder: Secondary | ICD-10-CM | POA: Insufficient documentation

## 2022-05-03 DIAGNOSIS — F329 Major depressive disorder, single episode, unspecified: Secondary | ICD-10-CM | POA: Insufficient documentation

## 2022-05-03 DIAGNOSIS — F29 Unspecified psychosis not due to a substance or known physiological condition: Secondary | ICD-10-CM | POA: Insufficient documentation

## 2022-05-03 DIAGNOSIS — R45851 Suicidal ideations: Secondary | ICD-10-CM | POA: Insufficient documentation

## 2022-05-03 DIAGNOSIS — R443 Hallucinations, unspecified: Secondary | ICD-10-CM | POA: Diagnosis not present

## 2022-05-03 DIAGNOSIS — F314 Bipolar disorder, current episode depressed, severe, without psychotic features: Secondary | ICD-10-CM | POA: Diagnosis present

## 2022-05-03 DIAGNOSIS — Z20822 Contact with and (suspected) exposure to covid-19: Secondary | ICD-10-CM | POA: Insufficient documentation

## 2022-05-03 MED ORDER — IBUPROFEN 200 MG PO TABS
600.0000 mg | ORAL_TABLET | Freq: Four times a day (QID) | ORAL | Status: DC | PRN
  Administered 2022-05-04: 600 mg via ORAL
  Filled 2022-05-03: qty 3

## 2022-05-03 MED ORDER — PANTOPRAZOLE SODIUM 40 MG PO TBEC
40.0000 mg | DELAYED_RELEASE_TABLET | Freq: Every day | ORAL | Status: DC
  Administered 2022-05-03: 40 mg via ORAL
  Filled 2022-05-03: qty 1

## 2022-05-03 MED ORDER — LORAZEPAM 1 MG PO TABS: 2.0000 mg | ORAL_TABLET | Freq: Once | ORAL | Status: DC

## 2022-05-03 MED ORDER — ESCITALOPRAM OXALATE 10 MG PO TABS
10.0000 mg | ORAL_TABLET | Freq: Every day | ORAL | Status: DC
  Administered 2022-05-04: 10 mg via ORAL
  Filled 2022-05-03: qty 1

## 2022-05-03 MED ORDER — PRAZOSIN HCL 1 MG PO CAPS
1.0000 mg | ORAL_CAPSULE | Freq: Every day | ORAL | Status: DC
  Administered 2022-05-03: 1 mg via ORAL
  Filled 2022-05-03: qty 1

## 2022-05-03 MED ORDER — OLANZAPINE 5 MG PO TABS
5.0000 mg | ORAL_TABLET | Freq: Every day | ORAL | Status: DC
  Administered 2022-05-03: 5 mg via ORAL
  Filled 2022-05-03: qty 1

## 2022-05-03 MED ORDER — FLUTICASONE FUROATE-VILANTEROL 200-25 MCG/ACT IN AEPB
1.0000 | INHALATION_SPRAY | Freq: Every day | RESPIRATORY_TRACT | Status: DC
  Administered 2022-05-03: 1 via RESPIRATORY_TRACT
  Filled 2022-05-03: qty 28

## 2022-05-03 MED ORDER — METOPROLOL SUCCINATE ER 50 MG PO TB24
25.0000 mg | ORAL_TABLET | Freq: Every day | ORAL | Status: DC
  Administered 2022-05-04: 25 mg via ORAL
  Filled 2022-05-03: qty 1

## 2022-05-03 MED ORDER — HYDROXYZINE HCL 25 MG PO TABS
25.0000 mg | ORAL_TABLET | Freq: Two times a day (BID) | ORAL | Status: DC | PRN
  Administered 2022-05-03: 25 mg via ORAL
  Filled 2022-05-03: qty 1

## 2022-05-03 MED ORDER — PANTOPRAZOLE SODIUM 40 MG PO TBEC
40.0000 mg | DELAYED_RELEASE_TABLET | Freq: Every day | ORAL | Status: DC
  Administered 2022-05-04: 40 mg via ORAL
  Filled 2022-05-03: qty 1

## 2022-05-03 MED ORDER — ALBUTEROL SULFATE HFA 108 (90 BASE) MCG/ACT IN AERS: 2.0000 | INHALATION_SPRAY | Freq: Four times a day (QID) | RESPIRATORY_TRACT | Status: DC | PRN

## 2022-05-03 MED ORDER — DICLOFENAC SODIUM 1 % EX GEL
1.0000 | Freq: Four times a day (QID) | CUTANEOUS | Status: DC
  Administered 2022-05-04 (×2): 1 via TOPICAL
  Filled 2022-05-03: qty 100

## 2022-05-03 MED ORDER — CARIPRAZINE HCL 1.5 MG PO CAPS
4.5000 mg | ORAL_CAPSULE | Freq: Every day | ORAL | Status: DC
  Administered 2022-05-04: 4.5 mg via ORAL
  Filled 2022-05-03: qty 3

## 2022-05-03 MED ORDER — PREGABALIN 50 MG PO CAPS
100.0000 mg | ORAL_CAPSULE | Freq: Three times a day (TID) | ORAL | Status: DC
  Administered 2022-05-03 – 2022-05-04 (×2): 100 mg via ORAL
  Filled 2022-05-03 (×2): qty 2

## 2022-05-03 MED ORDER — TRAZODONE HCL 50 MG PO TABS
50.0000 mg | ORAL_TABLET | Freq: Every day | ORAL | Status: DC
  Administered 2022-05-03: 50 mg via ORAL
  Filled 2022-05-03: qty 1

## 2022-05-03 MED ORDER — CYCLOBENZAPRINE HCL 10 MG PO TABS
10.0000 mg | ORAL_TABLET | Freq: Every day | ORAL | Status: DC
  Administered 2022-05-04: 10 mg via ORAL
  Filled 2022-05-03: qty 1

## 2022-05-03 MED ORDER — HYDROXYZINE HCL 25 MG PO TABS: 25.0000 mg | ORAL_TABLET | ORAL | Status: DC | PRN

## 2022-05-03 MED ORDER — HYDROXYZINE HCL 25 MG PO TABS
50.0000 mg | ORAL_TABLET | Freq: Once | ORAL | Status: AC
  Administered 2022-05-03: 50 mg via ORAL
  Filled 2022-05-03: qty 2

## 2022-05-03 MED ORDER — ESCITALOPRAM OXALATE 10 MG PO TABS
10.0000 mg | ORAL_TABLET | Freq: Every day | ORAL | Status: DC
  Administered 2022-05-03: 10 mg via ORAL
  Filled 2022-05-03: qty 1

## 2022-05-03 MED ORDER — MOMETASONE FURO-FORMOTEROL FUM 200-5 MCG/ACT IN AERO
2.0000 | INHALATION_SPRAY | Freq: Two times a day (BID) | RESPIRATORY_TRACT | Status: DC
  Administered 2022-05-04: 2 via RESPIRATORY_TRACT
  Filled 2022-05-03: qty 8.8

## 2022-05-03 NOTE — ED Notes (Signed)
Pt was given a water.

## 2022-05-03 NOTE — ED Notes (Signed)
Pt has some belongings located in locker 36 and black duffel bag located under desk in [place]
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
## 2022-05-03 NOTE — ED Provider Notes (Signed)
Psych had cleared patient for d/c.     Isla Pence, MD 05/03/22 1520

## 2022-05-03 NOTE — Discharge Instructions (Addendum)
Discharge recommendations:  Patient is to take medications as prescribed. Please see information for follow-up appointment with psychiatry and therapy. Please follow up with your primary care provider for all medical related needs.   Therapy: We recommend that patient participate in individual therapy to address mental health concerns.  Medications: The patient or guardian is to contact a medical professional and/or outpatient provider to address any new side effects that develop. The patient or guardian should update outpatient providers of any new medications and/or medication changes.   Atypical antipsychotics: If you are prescribed an atypical antipsychotic, it is recommended that your height, weight, BMI, blood pressure, fasting lipid panel, and fasting blood sugar be monitored by your outpatient providers.  Safety:  The patient should abstain from use of illicit substances/drugs and abuse of any medications. If symptoms worsen or do not continue to improve or if the patient becomes actively suicidal or homicidal then it is recommended that the patient return to the closest hospital emergency department, the [facility], or call 911 for further evaluation and treatment. National Suicide Prevention Lifeline 1-800-SUICIDE or 320-414-2860.  About 988 988 offers 24/7 access to trained crisis counselors who can help people experiencing mental health-related distress. People can call or text 988 or chat 988lifeline.org for themselves or if they are worried about a loved one who may need crisis support.  Crisis Mobile: [facility]:                     226-226-0133 (for crisis response 24 hours a day) [place]:                                            ([phone]    Safety Plan [name] will reach out to [name] her roommate, call 911 or call mobile crisis, or go to nearest emergency room if condition worsens  or if suicidal thoughts become active Patients' will follow up with [facility] for outpatient psychiatric services (therapy/medication management).  The suicide prevention education provided includes the following: Suicide risk factors Suicide prevention and interventions National Suicide Hotline telephone number [facility] assessment telephone number [facility] Emergency Assistance [place] and/or Residential Mobile Crisis Unit telephone number Request made of family/significant other to:  [facility] [employer] (e.g., guns, rifles, knives), all items previously/currently identified as safety concern.   Remove drugs/medications (over the counter, prescriptions, illicit drugs), all items previously/currently identified as a safety concern.

## 2022-05-03 NOTE — Consult Note (Signed)
[facility] ED ASSESSMENT   Reason for Consult:  Psych Consult Referring Physician:  Dr. Melina Copa Patient Identification: [name] MRN:  [id number] ED Chief Complaint: <principal problem not specified>  Diagnosis:  Active Problems:   Suicidal ideations   ED Assessment Time Calculation: Start Time: 1200 Stop Time: 1230 Total Time in Minutes (Assessment Completion): 30    HPI:  Per Triage Note: Patient arrived with [name] for recurrent SI thoughts with hallucinations.  Patient reports plan to OD on pills, patient reports the "voices are telling me to kill myself ".  Patient stated she would like to go [place].  No new self-inflicted injuries since last week per patient.  Patient, cooperative and current no acute distress noted.   Subjective: [name], 32 y.o., female patient seen face to face by this provider, consulted with Dr. Dwyane Dee; and chart reviewed on 05/03/22.  On evaluation [name] reports that she is ready to go home, she denies SI/HI/AVH.  She states that her appetite and sleep are good.  States that she was just having a "bad day yesterday", and she felt like she needed to see somebody.  Patient states she is compliant with her medications, and she has a community support team, who comes out and follows up with her and she was last seen by them on Tuesday, March 26. Patient able to contract for safety. Patient denies any substances or alcohol abuse. BAL < 10, and UDS never received. Patinet states she is going to start volunteering to feel useful, as she does not work at this time.   During evaluation [name] is lying in her bed in no acute distress.  She is alert, oriented x 4, calm, cooperative and attentive. Her mood is euthymic with congruent affect. She has normal speech, and behavior.  Objectively there is no evidence of psychosis/mania or delusional thinking. Patient is able to converse coherently, goal directed thoughts, no  distractibility, or pre-occupation. She also denies suicidal/self-harm/homicidal ideation, psychosis, and paranoia. [name] is well known to [employer] and frequently utilizes mental health urgent care services.   Spoke with patient roommate [name] and he does not feel that she is a danger to self, or anyone else, he feels that she can come back home. He states he just does not know why she keeps coming in and out of the hospital. [name] says he is very supportive and will help her, however he needs too. He states he will help keep her safe, they have been friends for a long time.    At this time [name] is educated and verbalizes understanding of mental health resources and other crisis services in the community. She is instructed to call 911 and present to the nearest emergency room should she experience any suicidal/homicidal ideation, auditory/visual/hallucinations, or detrimental worsening of her mental health condition.    Past Psychiatric History: Anxiety, Depression   Risk to Self or Others: Is the patient at risk to self? No Has the patient been a risk to self in the past 6 months? No Has the patient been a risk to self within the distant past? No Is the patient a risk to others? No Has the patient been a risk to others in the past 6 months? No Has the patient been a risk to others within the distant past? No  [place] Scale:  [place] [facility] from 05/02/2022 in [facility] Emergency Department at [facility] Admission (Discharged)  from 04/19/2022 in [facility] ED from 04/18/2022 in [facility] Emergency Department at [place] High Risk No Risk Error: Question 6 not populated       AIMS:  , , ,  ,   ASAM: ASAM Multidimensional Assessment Summary Dimension 1:  Description of individual's past and current experiences of substance use and withdrawal: Pt  denies, substance use. DImension 1:  Acute Intoxication and/or Withdrawal Potential Severity Rating: None Dimension 2:  Description of patient's biomedical conditions and  complications: Pt denies, substance use. Dimension 2:  Biomedical Conditions and Complications Severity Rating: None Dimension 3:  Description of emotional, behavioral, or cognitive conditions and complications: Pt denies, substance use. Dimension 3:  Emotional, behavioral or cognitive (EBC) conditions and complications severity rating: None Dimension 4:  Description of Readiness to Change criteria: Pt denies, substance use. Dimension 4:  Readiness to Change Severity Rating: None Dimension 5:  Relapse, continued use, or continued problem potential critiera description: Pt denies, substance use. Dimension 5:  Relapse, continued use, or continued problem potential severity rating:  (n/a) Dimension 6:  Recovery/Iiving environment criteria description: Pt denies, substance use. Dimension 6:  Recovery/living environment severity rating:  (n/a) ASAM Recommended Level of Treatment:  (Pt denies, substance use.)  Substance Abuse:  Alcohol / Drug Use Pain Medications: See MAR Prescriptions: See MAR Over the Counter: See MAR History of alcohol / drug use?: No history of alcohol / drug abuse Longest period of sobriety (when/how long): Pt denies, substance use. Negative Consequences of Use:  (Pt denies, substance use.) Withdrawal Symptoms: None  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 fragile X syndrome    Chronic constipation    Depression    Drug-seeking behavior    Essential tremor    Headache    Overdose of acetaminophen 07/2017   and other meds   Personality disorder ([place])    Schizo-affective psychosis ([place])    Schizoaffective disorder, bipolar type ([place])    Seizures ([place])    Last seizure December 2017    Sleep apnea     Past Surgical History:  Procedure Laterality Date   MOUTH SURGERY  2009 or 2010   Family History:  Family History  Problem Relation Age of Onset   Mental illness Father    Asthma Father    PDD Brother    Seizures Brother     Social History:  Social History   Substance and Sexual Activity  Alcohol Use No   Alcohol/week: 1.0 standard drink of alcohol   Types: 1 Standard drinks or equivalent per week   Comment: denies at this time     Social History   Substance and Sexual Activity  Drug Use No   Comment: History of cocaine use at age 69 for 4 months    Social History   Socioeconomic History   Marital status: Widowed    Spouse name: Not on file   Number of children: 0   Years of education: Not on file   Highest education level: Not on file  Occupational History   Occupation: disability  Tobacco Use   Smoking status: Former    Packs/day: 0    Types: Cigarettes   Smokeless tobacco: Never   Tobacco comments:    Smoked for 2  years age [age]  Vaping Use   Vaping Use: Never used  Substance and Sexual Activity   Alcohol use: No    Alcohol/week: 1.0 standard drink of alcohol    Types: 1 Standard drinks or equivalent per week    Comment: denies at this time   Drug use: No    Comment: History of cocaine use at age 54 for 4 months   Sexual activity: Not Currently    Birth control/protection: None  Other Topics Concern   Not on file  Social History Narrative   Marital status: Widowed      Children: daughter      Lives: with boyfriend, in two story home      Employment:  Disability      Tobacco: quit smoking; smoked for two years.      Alcohol ;none      Drugs: none   Has not traveled outside of the country.   Right handed         Social Determinants of Health   Financial Resource Strain: Not on file  Food Insecurity: No Food Insecurity (04/19/2022)   Hunger Vital Sign    Worried About Running Out of Food in the Last Year: Never true     Ran Out of Food in the Last Year: Never true  Transportation Needs: Unmet Transportation Needs (04/19/2022)   PRAPARE - Hydrologist (Medical): Yes    Lack of Transportation (Non-Medical): Yes  Physical Activity: Not on file  Stress: Not on file  Social Connections: Not on file   Additional Social History:    Allergies:   Allergies  Allergen Reactions   Bee Venom Anaphylaxis   Coconut Flavor Anaphylaxis and Rash   Fish Allergy Anaphylaxis   Geodon [Ziprasidone Hcl] Other (See Comments)    Pt states that this medication causes paralysis of the mouth.     Haloperidol And Related Other (See Comments)    Pt states that this medication causes paralysis of the mouth, jaw locks up   Lithobid [Lithium] Other (See Comments)    Seizure-like activity    Roxicodone [Oxycodone] Other (See Comments)    Hallucinations    Seroquel [Quetiapine] Other (See Comments)    Severe drowsiness Pt currently taking 12.5 mg BID, she is ok with this dose   Shellfish Allergy Anaphylaxis   Phenergan [Promethazine Hcl] Other (See Comments)    Chest pain     Prilosec [Omeprazole] Nausea And Vomiting    Pt can take protonix with no problems    Sulfa Antibiotics Other (See Comments)    Chest pain    Tegretol [Carbamazepine] Nausea And Vomiting   Prozac [Fluoxetine] Other (See Comments)    Increased Depression Suicidal thoughts   Tape Other (See Comments)    Skin tears, can only tolerate paper tape.   Tylenol [Acetaminophen] Rash    Rash on face     Labs:  Results for orders placed or performed during the hospital encounter of 05/02/22 (from the past 48 hour(s))  CBC with Diff     Status: Abnormal   Collection Time: 05/02/22 10:02 PM  Result Value Ref Range   WBC 9.8 4.0 - 10.5 K/uL   RBC 4.70 3.87 - 5.11 MIL/uL   Hemoglobin 11.9 (L) 12.0 - 15.0 g/dL   HCT 38.2 36.0 - 46.0 %   MCV 81.3 80.0 - 100.0 fL   MCH 25.3 (L) 26.0 - 34.0 pg   MCHC 31.2 30.0 - 36.0  g/dL   RDW  14.6 11.5 - 15.5 %   Platelets 320 150 - 400 K/uL   nRBC 0.0 0.0 - 0.2 %   Neutrophils Relative % 60 %   Neutro Abs 5.9 1.7 - 7.7 K/uL   Lymphocytes Relative 33 %   Lymphs Abs 3.2 0.7 - 4.0 K/uL   Monocytes Relative 5 %   Monocytes Absolute 0.5 0.1 - 1.0 K/uL   Eosinophils Relative 1 %   Eosinophils Absolute 0.1 0.0 - 0.5 K/uL   Basophils Relative 1 %   Basophils Absolute 0.1 0.0 - 0.1 K/uL   Immature Granulocytes 0 %   Abs Immature Granulocytes 0.03 0.00 - 0.07 K/uL    Comment: Performed at [facility], [place] [address] [place], [place] [address]  Comprehensive metabolic panel     Status: Abnormal   Collection Time: 05/02/22 10:05 PM  Result Value Ref Range   Sodium 140 135 - 145 mmol/L   Potassium 3.6 3.5 - 5.1 mmol/L   Chloride 111 98 - 111 mmol/L   CO2 20 (L) 22 - 32 mmol/L   Glucose, Bld 112 (H) 70 - 99 mg/dL    Comment: Glucose reference range applies only to samples taken after fasting for at least 8 hours.   BUN 14 6 - 20 mg/dL   Creatinine, Ser 0.65 0.44 - 1.00 mg/dL   Calcium 8.7 (L) 8.9 - 10.3 mg/dL   Total Protein 7.5 6.5 - 8.1 g/dL   Albumin 4.1 3.5 - 5.0 g/dL   AST 21 15 - 41 U/L   ALT 22 0 - 44 U/L   Alkaline Phosphatase 68 38 - 126 U/L   Total Bilirubin 0.5 0.3 - 1.2 mg/dL   GFR, Estimated >60 >60 mL/min    Comment: (NOTE) Calculated using the CKD-EPI Creatinine Equation (2021)    Anion gap 9 5 - 15    Comment: Performed at [facility], [place] [address] [place], [place] [address]  Ethanol     Status: None   Collection Time: 05/02/22 10:05 PM  Result Value Ref Range   Alcohol, Ethyl (B) <10 <10 mg/dL    Comment: (NOTE) Lowest detectable limit for serum alcohol is 10 mg/dL.  For medical purposes only. Performed at [facility], [place] [address] [place], [place] [address]   SARS Coronavirus 2 by RT PCR (hospital order, performed in [facility] hospital lab) *cepheid single result test* Anterior  Nasal Swab     Status: None   Collection Time: 05/02/22 10:06 PM   Specimen: Anterior Nasal Swab  Result Value Ref Range   SARS Coronavirus 2 by RT PCR NEGATIVE NEGATIVE    Comment: (NOTE) SARS-CoV-2 target nucleic acids are NOT DETECTED.  The SARS-CoV-2 RNA is generally detectable in upper and lower respiratory specimens during the acute phase of infection. The lowest concentration of SARS-CoV-2 viral copies this assay can detect is 250 copies / mL. A negative result does not preclude SARS-CoV-2 infection and should not be used as the sole basis for treatment or other patient management decisions.  A negative result may occur with improper specimen collection / handling, submission of specimen other than nasopharyngeal swab, presence of viral mutation(s) within the areas targeted by this assay, and inadequate number of viral copies (<250 copies / mL). A negative result must be combined with clinical observations, patient history, and epidemiological information.  Fact Sheet for Patients:   https://www.patel.info/  Fact Sheet for Healthcare Providers: https://hall.com/  This test is not yet approved or  cleared by the [place] and has been authorized for detection and/or diagnosis of SARS-CoV-2 by FDA under an Emergency Use Authorization (EUA).  This EUA will remain in effect (meaning this test can be used) for the duration of the COVID-19 declaration under Section 564(b)(1) of the Act, 21 U.S.C. section 360bbb-3(b)(1), unless the authorization is terminated or revoked sooner.  Performed at [facility], [place] [address] [place], [place] [address]   I-Stat beta hCG blood, ED     Status: None   Collection Time: 05/02/22 10:11 PM  Result Value Ref Range   I-stat hCG, quantitative <5.0 <5 mIU/mL   Comment 3            Comment:   GEST. AGE      CONC.  (mIU/mL)   <=1 WEEK        5 - 50     2 WEEKS       50 -  500     3 WEEKS       100 - 10,000     4 WEEKS     1,000 - 30,000        FEMALE AND NON-PREGNANT FEMALE:     LESS THAN 5 mIU/mL     Current Facility-Administered Medications  Medication Dose Route Frequency Provider Last Rate Last Admin   albuterol (VENTOLIN HFA) 108 (90 Base) MCG/ACT inhaler 2 puff  2 puff Inhalation Q6H PRN Elgie Congo, MD       cariprazine (VRAYLAR) capsule 4.5 mg  4.5 mg Oral Daily Hayden Rasmussen, MD   4.5 mg at 05/03/22 0912   escitalopram (LEXAPRO) tablet 10 mg  10 mg Oral Daily Georgina Snell C, MD   10 mg at 05/03/22 0912   fluticasone furoate-vilanterol (BREO ELLIPTA) 200-25 MCG/ACT 1 puff  1 puff Inhalation Daily Elgie Congo, MD   1 puff at 05/03/22 0913   hydrOXYzine (ATARAX) tablet 25 mg  25 mg Oral BID PRN Hayden Rasmussen, MD       ibuprofen (ADVIL) tablet 600 mg  600 mg Oral Q8H PRN Hayden Rasmussen, MD   600 mg at 05/02/22 2337   LORazepam (ATIVAN) tablet 2 mg  2 mg Oral Once Delora Fuel, MD       metoprolol succinate (TOPROL-XL) 24 hr tablet 25 mg  25 mg Oral Daily Hayden Rasmussen, MD   25 mg at 05/03/22 0912   OLANZapine (ZYPREXA) tablet 5 mg  5 mg Oral Daily PRN Hayden Rasmussen, MD   5 mg at 05/02/22 2240   pantoprazole (PROTONIX) EC tablet 40 mg  40 mg Oral Daily Georgina Snell C, MD   40 mg at 05/03/22 0912   pregabalin (LYRICA) capsule 100 mg  100 mg Oral TID Hayden Rasmussen, MD   100 mg at 05/03/22 0912   traZODone (DESYREL) tablet 50 mg  50 mg Oral QHS PRN Hayden Rasmussen, MD   50 mg at 05/02/22 2240   Current Outpatient Medications  Medication Sig Dispense Refill   albuterol (VENTOLIN HFA) 108 (90 Base) MCG/ACT inhaler Inhale 2 puffs into the lungs every 6 (six) hours as needed for wheezing or shortness of breath.     BREYNA 160-4.5 MCG/ACT inhaler Inhale 1 puff into the lungs daily.     cariprazine 4.5 MG CAPS Take 1 capsule (4.5 mg total) by mouth daily for 7 days. 7 capsule 0  cyclobenzaprine (FLEXERIL) 10 MG  tablet Take 10 mg by mouth daily.     diclofenac Sodium (VOLTAREN) 1 % GEL Apply 1 Application topically 4 (four) times daily.     escitalopram (LEXAPRO) 10 MG tablet Take 1 tablet (10 mg total) by mouth daily for 14 days. 14 tablet 0   hydrOXYzine (ATARAX) 25 MG tablet Take 25 mg twice daily by mouth as needed for anxiety. Take 100 mg by mouth for sleep as needed at bedtime. (Patient taking differently: Take 25 mg by mouth as needed for anxiety.) 7 tablet 0   metoprolol succinate (TOPROL-XL) 25 MG 24 hr tablet Take 1 tablet (25 mg total) by mouth daily for 7 days. 7 tablet 0   OLANZapine (ZYPREXA) 5 MG tablet Take 1 tablet (5 mg total) by mouth daily as needed for up to 7 days (anxiety). (Patient taking differently: Take 5 mg by mouth at bedtime.) 7 tablet 0   pantoprazole (PROTONIX) 40 MG tablet Take 1 tablet (40 mg total) by mouth daily for 14 days. 14 tablet 0   prazosin (MINIPRESS) 1 MG capsule Take 1 capsule (1 mg total) by mouth at bedtime for 14 days. 14 capsule 0   pregabalin (LYRICA) 100 MG capsule Take 100 mg by mouth 3 (three) times daily.     traZODone (DESYREL) 50 MG tablet Take 1 tablet (50 mg total) by mouth at bedtime as needed for up to 7 days for sleep. (Patient taking differently: Take 50 mg by mouth at bedtime.) 7 tablet 0    Musculoskeletal: Strength & Muscle Tone: within normal limits Gait & Station: normal Patient leans: N/A   Psychiatric Specialty Exam: Presentation  General Appearance:  Appropriate for Environment  Eye Contact: Fleeting  Speech: Clear and Coherent  Speech Volume: Normal  Handedness: Right   Mood and Affect  Mood: Euthymic  Affect: Appropriate   Thought Process  Thought Processes: Coherent  Descriptions of Associations:Intact  Orientation:Full (Time, Place and Person)  Thought Content:WDL  History of Schizophrenia/Schizoaffective disorder:No  Duration of Psychotic Symptoms:N/A  Hallucinations:Hallucinations:  None  Ideas of Reference:None  Suicidal Thoughts:Suicidal Thoughts: No SI Active Intent and/or Plan: Without Intent SI Passive Intent and/or Plan: Without Intent  Homicidal Thoughts:Homicidal Thoughts: No   Sensorium  Memory: Immediate Fair; Remote Fair  Judgment: Fair  Insight: Fair   Community education officer  Concentration: Good  Attention Span: Good  Recall: Good  Fund of Knowledge: Good  Language: Good   Psychomotor Activity  Psychomotor Activity: Psychomotor Activity: Normal   Assets  Assets: Communication Skills; Housing; Social Support    Sleep  Sleep: Sleep: Fair   Physical Exam: Physical Exam Vitals and nursing note reviewed.  HENT:     Head: Normocephalic.  Cardiovascular:     Rate and Rhythm: Normal rate.  Musculoskeletal:        General: Normal range of motion.     Cervical back: Normal range of motion.  Neurological:     Mental Status: She is alert.  Psychiatric:        Mood and Affect: Mood normal.        Behavior: Behavior normal.        Thought Content: Thought content normal.        Judgment: Judgment normal.    Review of Systems  Constitutional: Negative.   Respiratory: Negative.    Psychiatric/Behavioral: Negative.     Blood pressure 114/70, pulse 91, temperature 97.6 F (36.4 C), temperature source Oral, resp. rate 17, height 5\' 5"  (  1.651 m), weight 87 kg, last menstrual period 04/25/2022, SpO2 98 %. Body mass index is 31.92 kg/m.    Medical Decision Making: Patient is psychiatrically cleared. Patient case review and discussed with Dr. Dwyane Dee, and patient does not meet inpatient criteria for inpatient psychiatric treatment. At time of discharge, patient denies SI, HI, AVH and is able to contract for safety. She demonstrated no overt evidence of psychosis or mania. Prior to discharge, she verbalized that they understood warning signs, triggers, and symptoms of worsening mental health and how to access emergency mental  health care if they felt it was needed. Patient was instructed to call 911 or return to the emergency room if they experienced any concerning symptoms after discharge. Patient voiced understanding and agreed to the above.  Patient given resources to follow up with behavioral health urgent care for therapy and medication management.    Safety Plan Avishi Wetzel will reach out to [name] her roommate, call 911 or call mobile crisis, or go to nearest emergency room if condition worsens or if suicidal thoughts become active Patients' will follow up with [facility] for outpatient psychiatric services (therapy/medication management).  The suicide prevention education provided includes the following: Suicide risk factors Suicide prevention and interventions National Suicide Hotline telephone number [facility] assessment telephone number [facility] Emergency Assistance [place] and/or Residential Mobile Crisis Unit telephone number Request made of family/significant other to:  [facility] (e.g., guns, rifles, knives), all items previously/currently identified as safety concern.   Remove drugs/medications (over the counter, prescriptions, illicit drugs), all items previously/currently identified as a safety concern.      Disposition: Patient does not meet criteria for psychiatric inpatient admission. Supportive therapy provided about ongoing stressors. Discussed crisis plan, support from social network, calling 911, coming to the Emergency Department, and calling Suicide Hotline.  Michaele Offer, PMHNP 05/03/2022 3:13 PM

## 2022-05-03 NOTE — ED Provider Notes (Signed)
Emergency Medicine Observation Re-evaluation Note  [name] is a 32 y.o. female, seen on rounds today.  Pt initially presented to the ED for complaints of Suicidal Currently, the patient is awake, drinking water and conversing.  Physical Exam  BP (!) 130/110   Pulse 98   Temp 98.4 F (36.9 C) (Oral)   Resp 18   Ht 5\' 5"  (1.651 m)   Wt 87 kg   LMP 04/25/2022 (Exact Date)   SpO2 96%   BMI 31.92 kg/m  Physical Exam General: Awake, drinking water, no acute distress Cardiac: Regular rate Lungs: Comfortable breathing comfortably Psych: Calm  ED Course / MDM  EKG:   I have reviewed the labs performed to date as well as medications administered while in observation.  Recent changes in the last 24 hours include no acute events.  Plan  Current plan is for TTS reassessment today.    Elgie Congo, MD 05/03/22 [phone]

## 2022-05-03 NOTE — ED Notes (Signed)
Pt picking at skin, stating she "is trying to harm herself". MD made aware

## 2022-05-03 NOTE — ED Triage Notes (Signed)
Patient coming to ED for evaluation of suicidal thoughts and "hearing voices."  Reports was seen this morning for same, but was "too drugged to realize they were discharging me."  Reports she continues to have thoughts and would like to be seen.  Plan is to OD on pills.

## 2022-05-04 ENCOUNTER — Ambulatory Visit (INDEPENDENT_AMBULATORY_CARE_PROVIDER_SITE_OTHER)
Admission: EM | Admit: 2022-05-04 | Discharge: 2022-05-05 | Disposition: A | Discharge status: Home / Self Care | Payer: Medicare PPO | Source: Home / Self Care

## 2022-05-04 DIAGNOSIS — F319 Bipolar disorder, unspecified: Secondary | ICD-10-CM | POA: Insufficient documentation

## 2022-05-04 DIAGNOSIS — F4310 Post-traumatic stress disorder, unspecified: Secondary | ICD-10-CM | POA: Insufficient documentation

## 2022-05-04 DIAGNOSIS — R45851 Suicidal ideations: Secondary | ICD-10-CM | POA: Insufficient documentation

## 2022-05-04 DIAGNOSIS — R443 Hallucinations, unspecified: Secondary | ICD-10-CM | POA: Diagnosis not present

## 2022-05-04 DIAGNOSIS — Z1152 Encounter for screening for COVID-19: Secondary | ICD-10-CM | POA: Insufficient documentation

## 2022-05-04 DIAGNOSIS — F315 Bipolar disorder, current episode depressed, severe, with psychotic features: Secondary | ICD-10-CM

## 2022-05-04 DIAGNOSIS — F314 Bipolar disorder, current episode depressed, severe, without psychotic features: Secondary | ICD-10-CM | POA: Diagnosis not present

## 2022-05-04 LAB — COMPREHENSIVE METABOLIC PANEL
ALT: 21 U/L (ref 0–44)
AST: 19 U/L (ref 15–41)
Albumin: 4.3 g/dL (ref 3.5–5.0)
Alkaline Phosphatase: 66 U/L (ref 38–126)
Anion gap: 8 (ref 5–15)
BUN: 12 mg/dL (ref 6–20)
CO2: 22 mmol/L (ref 22–32)
Calcium: 8.8 mg/dL — ABNORMAL LOW (ref 8.9–10.3)
Chloride: 107 mmol/L (ref 98–111)
Creatinine, Ser: 0.69 mg/dL (ref 0.44–1.00)
GFR, Estimated: >60 mL/min (ref >60)
Glucose, Bld: 103 mg/dL — ABNORMAL HIGH (ref 70–99)
Potassium: 3.4 mmol/L — ABNORMAL LOW (ref 3.5–5.1)
Sodium: 137 mmol/L (ref 135–145)
Total Bilirubin: 0.4 mg/dL (ref 0.3–1.2)
Total Protein: 7.5 g/dL (ref 6.5–8.1)

## 2022-05-04 LAB — RESP PANEL BY RT-PCR (RSV, FLU A&B, COVID)  RVPGX2
Influenza A by PCR: NEGATIVE
Influenza B by PCR: NEGATIVE
Resp Syncytial Virus by PCR: NEGATIVE
SARS Coronavirus 2 by RT PCR: NEGATIVE

## 2022-05-04 LAB — ETHANOL: Alcohol, Ethyl (B): <10 mg/dL (ref <10)

## 2022-05-04 LAB — RAPID URINE DRUG SCREEN, HOSP PERFORMED
Amphetamines: NOT DETECTED
Barbiturates: NOT DETECTED
Benzodiazepines: NOT DETECTED
Cocaine: NOT DETECTED
Opiates: NOT DETECTED
Tetrahydrocannabinol: NOT DETECTED

## 2022-05-04 LAB — CBC
HCT: 40 % (ref 36.0–46.0)
Hemoglobin: 12.5 g/dL (ref 12.0–15.0)
MCH: 25.6 pg — ABNORMAL LOW (ref 26.0–34.0)
MCHC: 31.3 g/dL (ref 30.0–36.0)
MCV: 81.8 fL (ref 80.0–100.0)
Platelets: 290 10*3/uL (ref 150–400)
RBC: 4.89 MIL/uL (ref 3.87–5.11)
RDW: 14.6 % (ref 11.5–15.5)
WBC: 8.2 10*3/uL (ref 4.0–10.5)
nRBC: 0 % (ref 0.0–0.2)

## 2022-05-04 LAB — I-STAT BETA HCG BLOOD, ED (MC, WL, AP ONLY): I-stat hCG, quantitative: <5 m[IU]/mL (ref <5)

## 2022-05-04 MED ORDER — TRAZODONE HCL 50 MG PO TABS
50.0000 mg | ORAL_TABLET | Freq: Every evening | ORAL | Status: DC | PRN
  Administered 2022-05-04: 50 mg via ORAL
  Filled 2022-05-04: qty 1

## 2022-05-04 MED ORDER — LORAZEPAM 1 MG PO TABS: 1.0000 mg | ORAL_TABLET | Freq: Four times a day (QID) | ORAL | Status: DC | PRN

## 2022-05-04 MED ORDER — METOPROLOL SUCCINATE ER 25 MG PO TB24: 25.0000 mg | ORAL_TABLET | Freq: Every day | ORAL | Status: DC

## 2022-05-04 MED ORDER — ALBUTEROL SULFATE HFA 108 (90 BASE) MCG/ACT IN AERS: 2.0000 | INHALATION_SPRAY | Freq: Four times a day (QID) | RESPIRATORY_TRACT | Status: DC | PRN

## 2022-05-04 MED ORDER — MELATONIN 3 MG PO TABS: 3.0000 mg | ORAL_TABLET | Freq: Every evening | ORAL | Status: DC | PRN

## 2022-05-04 MED ORDER — DICLOFENAC SODIUM 1 % EX GEL
2.0000 g | Freq: Once | CUTANEOUS | Status: AC
  Administered 2022-05-04: 2 g via TOPICAL
  Filled 2022-05-04: qty 100

## 2022-05-04 MED ORDER — CARIPRAZINE HCL 1.5 MG PO CAPS: 4.5000 mg | ORAL_CAPSULE | Freq: Every day | ORAL | Status: DC

## 2022-05-04 MED ORDER — OLANZAPINE 5 MG PO TABS
5.0000 mg | ORAL_TABLET | Freq: Every day | ORAL | Status: DC
  Administered 2022-05-04: 5 mg via ORAL
  Filled 2022-05-04: qty 1

## 2022-05-04 MED ORDER — PRAZOSIN HCL 1 MG PO CAPS
1.0000 mg | ORAL_CAPSULE | Freq: Every day | ORAL | Status: DC
  Administered 2022-05-04: 1 mg via ORAL
  Filled 2022-05-04: qty 1

## 2022-05-04 MED ORDER — OLANZAPINE 10 MG IM SOLR: 10.0000 mg | Freq: Once | INTRAMUSCULAR | Status: DC | PRN

## 2022-05-04 MED ORDER — MOMETASONE FURO-FORMOTEROL FUM 200-5 MCG/ACT IN AERO
2.0000 | INHALATION_SPRAY | Freq: Two times a day (BID) | RESPIRATORY_TRACT | Status: DC
  Administered 2022-05-04: 2 via RESPIRATORY_TRACT
  Filled 2022-05-04: qty 8.8

## 2022-05-04 MED ORDER — DIPHENHYDRAMINE HCL 50 MG/ML IJ SOLN: 50.0000 mg | Freq: Once | INTRAMUSCULAR | Status: DC | PRN

## 2022-05-04 MED ORDER — OLANZAPINE 5 MG PO TBDP: 5.0000 mg | ORAL_TABLET | Freq: Two times a day (BID) | ORAL | Status: DC | PRN

## 2022-05-04 MED ORDER — ALUM & MAG HYDROXIDE-SIMETH 200-200-20 MG/5ML PO SUSP: 30.0000 mL | ORAL | Status: DC | PRN

## 2022-05-04 MED ORDER — PREGABALIN 50 MG PO CAPS
100.0000 mg | ORAL_CAPSULE | Freq: Three times a day (TID) | ORAL | Status: DC
  Administered 2022-05-04 (×2): 100 mg via ORAL
  Filled 2022-05-04 (×2): qty 2

## 2022-05-04 MED ORDER — PANTOPRAZOLE SODIUM 40 MG PO TBEC: 40.0000 mg | DELAYED_RELEASE_TABLET | Freq: Every day | ORAL | Status: DC

## 2022-05-04 MED ORDER — MAGNESIUM HYDROXIDE 400 MG/5ML PO SUSP: 30.0000 mL | Freq: Every day | ORAL | Status: DC | PRN

## 2022-05-04 MED ORDER — ESCITALOPRAM OXALATE 10 MG PO TABS: 10.0000 mg | ORAL_TABLET | Freq: Every day | ORAL | Status: DC

## 2022-05-04 MED ORDER — HYDROXYZINE HCL 25 MG PO TABS
25.0000 mg | ORAL_TABLET | Freq: Two times a day (BID) | ORAL | Status: DC | PRN
  Administered 2022-05-04: 25 mg via ORAL
  Filled 2022-05-04: qty 1

## 2022-05-04 NOTE — Progress Notes (Signed)
Jawanda verbalized about 2 hrs ago she is feeling better and the voices are gone. She stated she feels safe to go home tomorrow. She ate dinner and was cooperative until 1700 hrs when she begin hitting on the arms of the chair with increased intensity. She was told she is disturbing her peer. Shortly thereafter she verbalized her desire to go home. The NP responded and told her she can leave in the AM. She turned over 2 chairs and spilled water on the floor. She was offered medication, she refused. She turned her back to this Probation officer and asked me to leave her alone.

## 2022-05-04 NOTE — ED Provider Notes (Signed)
Emergency Medicine Observation Re-evaluation Note  [name] is a 32 y.o. female, seen on rounds today.  Pt initially presented to the ED for complaints of Suicidal Currently, the patient is sleeping.  Physical Exam  BP 113/73 (BP Location: Right Arm)   Pulse 88   Temp 97.8 F (36.6 C) (Oral)   Resp 16   LMP 04/25/2022 (Exact Date)   SpO2 98%  Physical Exam General: No distress Cardiac: well perfused  Lungs: No increased work of breathing Psych: Calm  ED Course / MDM  EKG:   I have reviewed the labs performed to date as well as medications administered while in observation.  Recent changes in the last 24 hours include psychiatric evaluation.  Plan  Current plan is for psychiatric evaluation.    Ezequiel Essex, MD 05/04/22 [phone]

## 2022-05-04 NOTE — ED Notes (Signed)
Patient wanded by security. 

## 2022-05-04 NOTE — ED Notes (Signed)
EMTALA signed and given to transport.

## 2022-05-04 NOTE — Progress Notes (Addendum)
Received [name] pacing in the hallway, she was escorted to the OBS unit and made comfortable.[name] denied all of the psychiatric symptoms except hearing voices. The skin assessment was completed. She has old cuts to her left forearm stating she performed the cuts last week.

## 2022-05-04 NOTE — ED Notes (Signed)
Patient is sleeping. Respirations equal and unlabored, skin warm and dry, NAD. No change in assessment or acuity. Routine safety checks conducted according to facility protocol. Will continue to monitor for safety.   

## 2022-05-04 NOTE — Consult Note (Signed)
[facility] ED ASSESSMENT   Reason for Consult:  Psych Consult Referring Physician:  Dr. Betsey Holiday Patient Identification: [name] MRN:  [id number] ED Chief Complaint: Bipolar 1 disorder, depressed, severe  Diagnosis:  Principal Problem:   Bipolar 1 disorder, depressed, severe ([place]) Active Problems:   Borderline personality disorder ([place])   Suicidal ideations   ED Assessment Time Calculation: Start Time: 1100 Stop Time: 1140 Total Time in Minutes (Assessment Completion): 40    HPI: Per Triage Note: Patient coming to ED for evaluation of suicidal thoughts and "hearing voices." Reports was seen this morning for same, but was "too drugged to realize they were discharging me." Reports she continues to have thoughts and would like to be seen. Plan is to OD on pills.     Subjective:  [name], 32 y.o., female patient seen face to face by this provider, consulted with Dr. Dwyane Dee; and chart reviewed on 05/04/22.  On evaluation [name] reports that after she left yesterday, states that her depression and suicidal thoughts became worse, feels like she was not ready to be discharged.  Patient states that she is hearing her parents voices that say she should kill herself.  She stated that coming to the emergency department was the last of the work, states he tried to use her coping skills which are painting/coloring, listening to music, and taking a shower she states those things did not work she even tried to call her community support team at [employer] and it did not help.  Patient states that she has some plans, but would never go through with it she just came here to get some help.  Patient denies HI/VH.  Patient states her appetite and sleep are fair, patient states she lives with a roommate and does not have a job.  During evaluation [name] is laying in bed. Body turned away from provider in no acute distress.  Provider asked patient to turn over so  that we could speak with her, patient nonchalantly turned toward staff. She is alert, oriented x 4, calm, cooperative and attentive. Her mood is euthymic with congruent affect.  She has normal speech, and behavior.  Objectively there is no evidence of psychosis/mania or delusional thinking.  Patient is able to converse coherently, goal directed thoughts, no distractibility, or pre-occupation.  She denies homicidal ideation, psychosis, and paranoia.  Patient currently denies suicidal ideations, but says her suicidal thoughts come and go, she feels that she does not want to go through with hurting herself she just wants help.  Patient states he has not had any self-harm behaviors since last week. Patient well-known to service, has a borderline personality disorder, is an act team who she can work with.    With patient permission spoke with CST-team at [facility] Thersa Salt), she stated that she saw patient on May 01, 2022, and patient said that she was on cloud 9 and that her world was great, states the patient appears to be in a good mood, no distress was noted.  Mrs. Purcell Nails states that she is Retail banker and she has been seen patient since August, states that running to the hospital is her mode of operation due to wanting attention.  Mrs. Purcell Nails states that she continues to do this because she wants her daughter back but patient has to be hospital free for 2 years in order for the court to consider her getting her daughter back.  She states that Medicaid quit paying for patient  stays and she has to pay now out-of-pocket.   Past Psychiatric History: Anxiety, Depression    Risk to Self or Others: Is the patient at risk to self? No  Has the patient been a risk to self in the past 6 months? No Has the patient been a risk to self within the distant past? No  Is the patient a risk to others? No Has the patient been a risk to others in the past 6 months? No Has the patient been a  risk to others within the distant past? No  [place] Scale:  [place] ED from 05/03/2022 in [facility] Emergency Department at [facility] ED from 05/02/2022 in [facility] Emergency Department at [facility] Admission (Discharged) from 04/19/2022 in [place] [facility]  C-SSRS RISK CATEGORY High Risk High Risk No Risk       AIMS:  , , ,  ,   ASAM:    Substance Abuse:  Alcohol / Drug Use Pain Medications: See MAR Prescriptions: See MAR Over the Counter: See MAR History of alcohol / drug use?: No history of alcohol / drug abuse Longest period of sobriety (when/how long): N/A Negative Consequences of Use:  (N/A) Withdrawal Symptoms:  (N/A)  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 fragile X syndrome    Chronic constipation    Depression    Drug-seeking behavior    Essential tremor    Headache    Overdose of acetaminophen 07/2017   and other meds   Personality disorder ([place])    Schizo-affective psychosis ([place])    Schizoaffective disorder, bipolar type ([place])    Seizures ([place])    Last seizure December 2017   Sleep apnea     Past Surgical History:  Procedure Laterality Date   MOUTH SURGERY  2009 or 2010   Family History:  Family History  Problem Relation Age of Onset   Mental illness Father    Asthma Father    PDD Brother    Seizures Brother     Social History:  Social History   Substance and Sexual Activity  Alcohol Use No   Alcohol/week: 1.0 standard drink of alcohol   Types: 1 Standard drinks or equivalent per week   Comment: denies at this time     Social History   Substance and Sexual Activity  Drug Use No   Comment: History of cocaine use at age 49 for 4 months    Social History   Socioeconomic History   Marital status: Widowed    Spouse name: Not on file   Number of children: 0    Years of education: Not on file   Highest education level: Not on file  Occupational History   Occupation: disability  Tobacco Use   Smoking status: Former    Packs/day: 0    Types: Cigarettes   Smokeless tobacco: Never   Tobacco comments:    Smoked for 2  years age [age]  Vaping Use   Vaping Use: Never used  Substance and Sexual Activity   Alcohol use: No    Alcohol/week: 1.0 standard drink of alcohol    Types: 1 Standard drinks or equivalent per week    Comment: denies at this time   Drug use: No    Comment: History of cocaine  use at age 44 for 4 months   Sexual activity: Not Currently    Birth control/protection: None  Other Topics Concern   Not on file  Social History Narrative   Marital status: Widowed      Children: daughter      Lives: with boyfriend, in two story home      Employment:  Disability      Tobacco: quit smoking; smoked for two years.      Alcohol ;none      Drugs: none   Has not traveled outside of the country.   Right handed         Social Determinants of Health   Financial Resource Strain: Not on file  Food Insecurity: No Food Insecurity (04/19/2022)   Hunger Vital Sign    Worried About Running Out of Food in the Last Year: Never true    Ran Out of Food in the Last Year: Never true  Transportation Needs: Unmet Transportation Needs (04/19/2022)   PRAPARE - Hydrologist (Medical): Yes    Lack of Transportation (Non-Medical): Yes  Physical Activity: Not on file  Stress: Not on file  Social Connections: Not on file      Allergies:   Allergies  Allergen Reactions   Bee Venom Anaphylaxis   Coconut Flavor Anaphylaxis and Rash   Fish Allergy Anaphylaxis   Geodon [Ziprasidone Hcl] Other (See Comments)    Pt states that this medication causes paralysis of the mouth.     Haloperidol And Related Other (See Comments)    Pt states that this medication causes paralysis of the mouth, jaw locks up   Lithobid [Lithium] Other  (See Comments)    Seizure-like activity    Roxicodone [Oxycodone] Other (See Comments)    Hallucinations    Seroquel [Quetiapine] Other (See Comments)    Severe drowsiness Pt currently taking 12.5 mg BID, she is ok with this dose   Shellfish Allergy Anaphylaxis   Phenergan [Promethazine Hcl] Other (See Comments)    Chest pain     Prilosec [Omeprazole] Nausea And Vomiting    Pt can take protonix with no problems    Sulfa Antibiotics Other (See Comments)    Chest pain    Tegretol [Carbamazepine] Nausea And Vomiting   Prozac [Fluoxetine] Other (See Comments)    Increased Depression Suicidal thoughts   Tape Other (See Comments)    Skin tears, can only tolerate paper tape.   Tylenol [Acetaminophen] Rash    Rash on face     Labs:  Results for orders placed or performed during the hospital encounter of 05/03/22 (from the past 48 hour(s))  Rapid urine drug screen (hospital performed)     Status: None   Collection Time: 05/03/22 11:37 PM  Result Value Ref Range   Opiates NONE DETECTED NONE DETECTED   Cocaine NONE DETECTED NONE DETECTED   Benzodiazepines NONE DETECTED NONE DETECTED   Amphetamines NONE DETECTED NONE DETECTED   Tetrahydrocannabinol NONE DETECTED NONE DETECTED   Barbiturates NONE DETECTED NONE DETECTED    Comment: (NOTE) DRUG SCREEN FOR MEDICAL PURPOSES ONLY.  IF CONFIRMATION IS NEEDED FOR ANY PURPOSE, NOTIFY LAB WITHIN 5 DAYS.  LOWEST DETECTABLE LIMITS FOR URINE DRUG SCREEN Drug Class                     Cutoff (ng/mL) Amphetamine and metabolites    1000 Barbiturate and metabolites    200 Benzodiazepine  200 Opiates and metabolites        300 Cocaine and metabolites        300 THC                            50 Performed at [facility] [address] [place], [place] [address]   CBC     Status: Abnormal   Collection Time: 05/04/22 12:28 AM  Result Value Ref Range   WBC 8.2 4.0 - 10.5 K/uL   RBC 4.89 3.87 - 5.11  MIL/uL   Hemoglobin 12.5 12.0 - 15.0 g/dL   HCT 40.0 36.0 - 46.0 %   MCV 81.8 80.0 - 100.0 fL   MCH 25.6 (L) 26.0 - 34.0 pg   MCHC 31.3 30.0 - 36.0 g/dL   RDW 14.6 11.5 - 15.5 %   Platelets 290 150 - 400 K/uL   nRBC 0.0 0.0 - 0.2 %    Comment: Performed at [name], [place] [address] [place], [place] [address]  Comprehensive metabolic panel     Status: Abnormal   Collection Time: 05/04/22 12:28 AM  Result Value Ref Range   Sodium 137 135 - 145 mmol/L   Potassium 3.4 (L) 3.5 - 5.1 mmol/L   Chloride 107 98 - 111 mmol/L   CO2 22 22 - 32 mmol/L   Glucose, Bld 103 (H) 70 - 99 mg/dL    Comment: Glucose reference range applies only to samples taken after fasting for at least 8 hours.   BUN 12 6 - 20 mg/dL   Creatinine, Ser 0.69 0.44 - 1.00 mg/dL   Calcium 8.8 (L) 8.9 - 10.3 mg/dL   Total Protein 7.5 6.5 - 8.1 g/dL   Albumin 4.3 3.5 - 5.0 g/dL   AST 19 15 - 41 U/L   ALT 21 0 - 44 U/L   Alkaline Phosphatase 66 38 - 126 U/L   Total Bilirubin 0.4 0.3 - 1.2 mg/dL   GFR, Estimated >60 >60 mL/min    Comment: (NOTE) Calculated using the CKD-EPI Creatinine Equation (2021)    Anion gap 8 5 - 15    Comment: Performed at [facility], [place] [address], [place], [place] [address]  Ethanol     Status: None   Collection Time: 05/04/22 12:28 AM  Result Value Ref Range   Alcohol, Ethyl (B) <10 <10 mg/dL    Comment: (NOTE) Lowest detectable limit for serum alcohol is 10 mg/dL.  For medical purposes only. Performed at [facility], [place] [address], [place], [place] [address]   I-Stat beta hCG blood, ED     Status: None   Collection Time: 05/04/22  1:04 AM  Result Value Ref Range   I-stat hCG, quantitative <5.0 <5 mIU/mL   Comment 3            Comment:   GEST. AGE      CONC.  (mIU/mL)   <=1 WEEK        5 - 50     2 WEEKS       50 - 500     3 WEEKS       100 - 10,000     4 WEEKS     1,000 - 30,000        FEMALE AND NON-PREGNANT  FEMALE:     LESS THAN 5 mIU/mL   Resp panel by RT-PCR (RSV, Flu A&B, Covid)  Anterior Nasal Swab     Status: None   Collection Time: 05/04/22 10:21 AM   Specimen: Anterior Nasal Swab  Result Value Ref Range   SARS Coronavirus 2 by RT PCR NEGATIVE NEGATIVE    Comment: (NOTE) SARS-CoV-2 target nucleic acids are NOT DETECTED.  The SARS-CoV-2 RNA is generally detectable in upper respiratory specimens during the acute phase of infection. The lowest concentration of SARS-CoV-2 viral copies this assay can detect is 138 copies/mL. A negative result does not preclude SARS-Cov-2 infection and should not be used as the sole basis for treatment or other patient management decisions. A negative result may occur with  improper specimen collection/handling, submission of specimen other than nasopharyngeal swab, presence of viral mutation(s) within the areas targeted by this assay, and inadequate number of viral copies(<138 copies/mL). A negative result must be combined with clinical observations, patient history, and epidemiological information. The expected result is Negative.  Fact Sheet for Patients:  EntrepreneurPulse.com.au  Fact Sheet for Healthcare Providers:  IncredibleEmployment.be  This test is no t yet approved or cleared by the [place] FDA and  has been authorized for detection and/or diagnosis of SARS-CoV-2 by FDA under an Emergency Use Authorization (EUA). This EUA will remain  in effect (meaning this test can be used) for the duration of the COVID-19 declaration under Section 564(b)(1) of the Act, 21 U.S.C.section 360bbb-3(b)(1), unless the authorization is terminated  or revoked sooner.       Influenza A by PCR NEGATIVE NEGATIVE   Influenza B by PCR NEGATIVE NEGATIVE    Comment: (NOTE) The Xpert Xpress SARS-CoV-2/FLU/RSV plus assay is intended as an aid in the diagnosis of influenza from Nasopharyngeal swab specimens and should not  be used as a sole basis for treatment. Nasal washings and aspirates are unacceptable for Xpert Xpress SARS-CoV-2/FLU/RSV testing.  Fact Sheet for Patients: EntrepreneurPulse.com.au  Fact Sheet for Healthcare Providers: IncredibleEmployment.be  This test is not yet approved or cleared by the [place] FDA and has been authorized for detection and/or diagnosis of SARS-CoV-2 by FDA under an Emergency Use Authorization (EUA). This EUA will remain in effect (meaning this test can be used) for the duration of the COVID-19 declaration under Section 564(b)(1) of the Act, 21 U.S.C. section 360bbb-3(b)(1), unless the authorization is terminated or revoked.     Resp Syncytial Virus by PCR NEGATIVE NEGATIVE    Comment: (NOTE) Fact Sheet for Patients: EntrepreneurPulse.com.au  Fact Sheet for Healthcare Providers: IncredibleEmployment.be  This test is not yet approved or cleared by the [place] FDA and has been authorized for detection and/or diagnosis of SARS-CoV-2 by FDA under an Emergency Use Authorization (EUA). This EUA will remain in effect (meaning this test can be used) for the duration of the COVID-19 declaration under Section 564(b)(1) of the Act, 21 U.S.C. section 360bbb-3(b)(1), unless the authorization is terminated or revoked.  Performed at [facility], [place] [address] [place],  [address]     Current Facility-Administered Medications  Medication Dose Route Frequency Provider Last Rate Last Admin   albuterol (VENTOLIN HFA) 108 (90 Base) MCG/ACT inhaler 2 puff  2 puff Inhalation Q6H PRN Pollina, Gwenyth Allegra, MD       cariprazine (VRAYLAR) capsule 4.5 mg  4.5 mg Oral Daily Pollina, Gwenyth Allegra, MD   4.5 mg at 05/04/22 0934   cyclobenzaprine (FLEXERIL) tablet 10 mg  10 mg Oral Daily Orpah Greek, MD   10 mg at 05/04/22 0912   diclofenac Sodium (VOLTAREN) 1 %  topical gel 1 Application  1 Application Topical QID Orpah Greek, MD   1 Application at 0000000 [id number]   escitalopram (LEXAPRO) tablet 10 mg  10 mg Oral Daily Orpah Greek, MD   10 mg at 05/04/22 0913   hydrOXYzine (ATARAX) tablet 25 mg  25 mg Oral BID PRN Orpah Greek, MD   25 mg at 05/03/22 2359   ibuprofen (ADVIL) tablet 600 mg  600 mg Oral Q6H PRN Orpah Greek, MD   600 mg at 05/04/22 0418   metoprolol succinate (TOPROL-XL) 24 hr tablet 25 mg  25 mg Oral Daily Orpah Greek, MD   25 mg at 05/04/22 0912   mometasone-formoterol (DULERA) 200-5 MCG/ACT inhaler 2 puff  2 puff Inhalation BID Orpah Greek, MD   2 puff at 05/04/22 0931   OLANZapine (ZYPREXA) tablet 5 mg  5 mg Oral QHS Orpah Greek, MD   5 mg at 05/03/22 2359   pantoprazole (PROTONIX) EC tablet 40 mg  40 mg Oral Daily Orpah Greek, MD   40 mg at 05/04/22 0913   prazosin (MINIPRESS) capsule 1 mg  1 mg Oral QHS Pollina, Gwenyth Allegra, MD   1 mg at 05/03/22 2358   pregabalin (LYRICA) capsule 100 mg  100 mg Oral TID Orpah Greek, MD   100 mg at 05/04/22 0914   traZODone (DESYREL) tablet 50 mg  50 mg Oral QHS Orpah Greek, MD   50 mg at 05/03/22 2359   Current Outpatient Medications  Medication Sig Dispense Refill   albuterol (VENTOLIN HFA) 108 (90 Base) MCG/ACT inhaler Inhale 2 puffs into the lungs every 6 (six) hours as needed for wheezing or shortness of breath.     BREYNA 160-4.5 MCG/ACT inhaler Inhale 1 puff into the lungs daily.     cariprazine 4.5 MG CAPS Take 1 capsule (4.5 mg total) by mouth daily for 7 days. 7 capsule 0   Cyanocobalamin (VITAMIN B-12 PO) Take 1 capsule by mouth daily.     cyclobenzaprine (FLEXERIL) 10 MG tablet Take 10 mg by mouth daily.     diclofenac Sodium (VOLTAREN) 1 % GEL Apply 1 Application topically 4 (four) times daily.     escitalopram (LEXAPRO) 10 MG tablet Take 1 tablet (10 mg total) by mouth daily for  14 days. 14 tablet 0   hydrOXYzine (ATARAX) 25 MG tablet Take 25 mg twice daily by mouth as needed for anxiety. Take 100 mg by mouth for sleep as needed at bedtime. (Patient taking differently: Take 25 mg by mouth as needed for anxiety.) 7 tablet 0   ibuprofen (ADVIL) 200 MG tablet Take 200 mg by mouth every 6 (six) hours as needed for moderate pain.     Melatonin 3 MG CAPS Take 3 mg by mouth at bedtime.     metoprolol succinate (TOPROL-XL) 25 MG 24 hr tablet Take 1 tablet (25 mg total) by mouth daily for 7 days. 7 tablet 0   OLANZapine (ZYPREXA) 5 MG tablet Take 1 tablet (5 mg total) by mouth daily as needed for up to 7 days (anxiety). (Patient taking differently: Take 5 mg by mouth at bedtime.) 7 tablet 0   pantoprazole (PROTONIX) 40 MG tablet Take 1 tablet (40 mg total) by mouth daily for 14 days. 14 tablet 0   prazosin (MINIPRESS) 1 MG capsule Take 1 capsule (1 mg total) by mouth at bedtime for 14 days. 14 capsule 0   pregabalin (LYRICA) 100 MG capsule  Take 100 mg by mouth 3 (three) times daily.     traZODone (DESYREL) 50 MG tablet Take 1 tablet (50 mg total) by mouth at bedtime as needed for up to 7 days for sleep. (Patient taking differently: Take 50 mg by mouth at bedtime.) 7 tablet 0    Musculoskeletal: Strength & Muscle Tone: within normal limits Gait & Station: normal Patient leans: N/A   Psychiatric Specialty Exam: Presentation  General Appearance:  Appropriate for Environment; Bizarre  Eye Contact: Poor  Speech: Clear and Coherent  Speech Volume: Normal  Handedness: Right   Mood and Affect  Mood: Euthymic  Affect: Appropriate   Thought Process  Thought Processes: Coherent  Descriptions of Associations:Intact  Orientation:Full (Time, Place and Person)  Thought Content:WDL  History of Schizophrenia/Schizoaffective disorder:Yes  Duration of Psychotic Symptoms:Greater than six months  Hallucinations:Hallucinations: Auditory Description of Auditory  Hallucinations: voices of parents telling her to kill herself  Ideas of Reference:None  Suicidal Thoughts:Suicidal Thoughts: Yes, Passive SI Active Intent and/or Plan: Without Intent SI Passive Intent and/or Plan: Without Intent  Homicidal Thoughts:Homicidal Thoughts: No   Sensorium  Memory: Immediate Fair; Recent Fair  Judgment: Fair  Insight: Fair   Materials engineer: Fair  Attention Span: Fair  Recall: [place] of Knowledge: Good  Language: Good   Psychomotor Activity  Psychomotor Activity: Psychomotor Activity: Normal   Assets  Assets: Communication Skills; Social Support; Housing    Sleep  Sleep: Sleep: Fair   Physical Exam: Physical Exam Eyes:     Pupils: Pupils are equal, round, and reactive to light.  Pulmonary:     Effort: Pulmonary effort is normal.  Musculoskeletal:     Cervical back: Normal range of motion.  Neurological:     Mental Status: She is alert.  Psychiatric:        Attention and Perception: Attention normal.        Mood and Affect: Mood is anxious.        Speech: Speech normal.        Behavior: Behavior is cooperative.        Thought Content: Thought content normal.        Judgment: Judgment is impulsive and inappropriate.    Review of Systems  Constitutional: Negative.   HENT: Negative.    Respiratory: Negative.    Musculoskeletal: Negative.   Psychiatric/Behavioral:  Positive for depression.    Blood pressure 111/75, pulse 77, temperature 98.3 F (36.8 C), temperature source Oral, resp. rate 16, last menstrual period 04/25/2022, SpO2 98 %. There is no height or weight on file to calculate BMI.    Medical Decision Making: Patient will be recommended to [facility] for continuous observation. Patient admitted to [facility] pending [place].      Perry Hall, PMHNP 05/04/2022 1:02 PM
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
## 2022-05-04 NOTE — ED Notes (Signed)
Patient engaging in TTS assessment at this time.

## 2022-05-04 NOTE — ED Notes (Signed)
Covid test was sent to lab

## 2022-05-04 NOTE — ED Notes (Signed)
Patient resting quietly in bed watching TV. Respirations equal and unlabored, skin warm and dry, NAD. No change in assessment or acuity. Routine safety checks conducted according to facility protocol. Will continue to monitor for safety.   

## 2022-05-04 NOTE — ED Notes (Signed)
Patient given sandwich and drink.  

## 2022-05-04 NOTE — ED Provider Notes (Signed)
[facility] Continuous Assessment Admission H&P  Date: 05/04/22 Patient Name: [name] MRN: [id number] Chief Complaint: Transfer from [facility] to [facility] with complaints of SI and AH.   Diagnoses:  Final diagnoses:  Bipolar I disorder, current or most recent episode depressed, with psychotic features ([place])  Suicidal ideation    HPI: [name] is a 32 year old female patient, well known to the [facility] service line, with a past psychiatric history significant for bipolar 1 disorder, borderline personality disorder, schizoaffective disorder, PTSD, GAD, numerous suicide attempts and numerous inpatient psychiatric hospitalizations. She's had 24 ED visits in the past 6 months. Patient transferred from the [facility] emergency department to the [facility] behavioral health urgent care voluntary for overnight observation with complaints of SI with a plan and AH.   Patient seen and evaluated face to face by this provider, chart reviewed and case discussed with Dr. Dwyane Dee. Per chart review, Patient was last hospitalized at [facility] from 04/19/22 to 05/01/22.  On evaluation, patient continues to endorses suicidal ideations with a plan to overdose on pills. She is unable to contract for safety at this time. She endorses AH telling her to kill herself. She states that she can't control herself when the voices tell her to kill herself. She reports experiencing these symptoms since Thursday. She states that she was triggered by her mother in-law sending her a mean text messages. She states that she has to talk to her mother in-law because she has custody of her 50 y.o. daughter. She states that she gets visitation with her daughter every 2 weeks on Saturdays. She states that her next visit with her daughter is on April 6th. She states that she tried using her coping skills of listening to music, coloring, painting, showering, stenciling, praying and watching t.v.  but those things didn't stop the voices. She endorses HI with no plan or intent towards her parents. She states, "I feel like they are out to get me. I keep hearing their voices in my head." She denies VH. She reports poor appetite. She reports fair sleep. She reports a depressed mood. She denies drinking alcohol or using illicit drugs. She is followed by [facility] and reports 3 visit per week, last visit was on Monday. She reports medication compliance. She denies medical side effects. She denies physical complaints. She resides with her ex-boyfriend/roommate. Patient asked if she would be sent back to [facility] hospital. I informed the patient that this provider has no say in placement for inpatient treatment and that the psychiatry social worker seeks appropriate placement based on availability. Patient asked if she is better tomorrow can she go home. Patient was informed that she will be re-evaluated tomorrow and a final recommendation for treatment will be determined at that time. Patient verbalizes understanding.    Total Time spent with patient: 30 minutes  Musculoskeletal  Strength & Muscle Tone: within normal limits Gait & Station: normal Patient leans: N/A  Psychiatric Specialty Exam  Presentation General Appearance:  Appropriate for Environment  Eye Contact: Fair  Speech: Clear and Coherent  Speech Volume: Normal  Handedness: Right   Mood and Affect  Mood: Dysphoric  Affect: Congruent   Thought Process  Thought Processes: Linear  Descriptions of Associations:Intact  Orientation:Full (Time, Place and Person)  Thought Content:Illogical  Diagnosis of Schizophrenia or Schizoaffective disorder in past: Yes  Duration of Psychotic Symptoms: Greater than six months  Hallucinations:Hallucinations: Auditory Description of Auditory Hallucinations: voices of parents  telling her to kill herself  Ideas of Reference:None  Suicidal Thoughts:Suicidal Thoughts: Yes,  Active SI Active Intent and/or Plan: With Plan SI Passive Intent and/or Plan: Without Intent  Homicidal Thoughts:Homicidal Thoughts: Yes, Passive HI Passive Intent and/or Plan: Without Plan   Sensorium  Memory: Immediate Fair; Recent Fair; Remote Fair  Judgment: Poor  Insight: Poor; Lacking   Executive Functions  Concentration: Fair  Attention Span: Fair  Recall: [employer] of Knowledge: Fair  Language: Fair   Psychomotor Activity  Psychomotor Activity: Psychomotor Activity: Normal   Assets  Assets: Armed forces logistics/support/administrative officer; Housing; Physical Health; Social Support   Sleep  Sleep: Sleep: Fair Number of Hours of Sleep: 7   Nutritional Assessment (For OBS and FBC admissions only) Has the patient had a weight loss or gain of 10 pounds or more in the last 3 months?: No Has the patient had a decrease in food intake/or appetite?: Yes Does the patient have dental problems?: No Does the patient have eating habits or behaviors that may be indicators of an eating disorder including binging or inducing vomiting?: No Has the patient recently lost weight without trying?: 1 Has the patient been eating poorly because of a decreased appetite?: 1 Malnutrition Screening Tool Score: 2    Physical Exam Eyes:     Conjunctiva/sclera: Conjunctivae normal.  Cardiovascular:     Rate and Rhythm: Normal rate.  Musculoskeletal:        General: Normal range of motion.     Cervical back: Normal range of motion.  Neurological:     Mental Status: She is alert and oriented to person, place, and time.    Review of Systems  Constitutional: Negative.   HENT: Negative.    Eyes: Negative.   Respiratory: Negative.    Cardiovascular: Negative.   Gastrointestinal: Negative.   Genitourinary: Negative.   Musculoskeletal: Negative.   Neurological: Negative.   Endo/Heme/Allergies: Negative.   Psychiatric/Behavioral:  Positive for depression, hallucinations and suicidal ideas.      Blood pressure 111/67, pulse 92, temperature 98.2 F (36.8 C), temperature source Oral, resp. rate 17, last menstrual period 04/25/2022, SpO2 97 %. There is no height or weight on file to calculate BMI.  Past Psychiatric History: History of bipolar 1 disorder, borderline personality disorder, schizoaffective disorder, PTSD, GAD, numerous suicide attempts and numerous inpatient psychiatric hospitalizations. Last inpatient psych hospitalization at [facility] from 04/19/22 to 05/01/22.  Is the patient at risk to self? Yes  Has the patient been a risk to self in the past 6 months? Yes .    Has the patient been a risk to self within the distant past? Yes   Is the patient a risk to others? Yes   Has the patient been a risk to others in the past 6 months? No   Has the patient been a risk to others within the distant past? No   Past Medical History: Patient reports a medical history of past seizures (no recent seizures), tachycardia, asthma, and fibromyalgia.  Family History: Patient reports a family history of father history of depression and PTSD, brother history of Asperger's and 2 great uncles completed suicide.  Social History: Single. Lives with ex boyfriend. Has a 41 y.o. daughter (mother in-law has custody of daughter). Patient denies drinking alcohol or using illicit drugs.   Last Labs:  Admission on 05/03/2022, Discharged on 05/04/2022  Component Date Value Ref Range Status   WBC 05/04/2022 8.2  4.0 - 10.5 K/uL Final   RBC  05/04/2022 4.89  3.87 - 5.11 MIL/uL Final   Hemoglobin 05/04/2022 12.5  12.0 - 15.0 g/dL Final   HCT 05/04/2022 40.0  36.0 - 46.0 % Final   MCV 05/04/2022 81.8  80.0 - 100.0 fL Final   MCH 05/04/2022 25.6 (L)  26.0 - 34.0 pg Final   MCHC 05/04/2022 31.3  30.0 - 36.0 g/dL Final   RDW 05/04/2022 14.6  11.5 - 15.5 % Final   Platelets 05/04/2022 290  150 - 400 K/uL Final   nRBC 05/04/2022 0.0  0.0 - 0.2 % Final   Performed at [facility], [place] [address] [place], [place] [address]   Sodium 05/04/2022 137  135 - 145 mmol/L Final   Potassium 05/04/2022 3.4 (L)  3.5 - 5.1 mmol/L Final   Chloride 05/04/2022 107  98 - 111 mmol/L Final   CO2 05/04/2022 22  22 - 32 mmol/L Final   Glucose, Bld 05/04/2022 103 (H)  70 - 99 mg/dL Final   Glucose reference range applies only to samples taken after fasting for at least 8 hours.   BUN 05/04/2022 12  6 - 20 mg/dL Final   Creatinine, Ser 05/04/2022 0.69  0.44 - 1.00 mg/dL Final   Calcium 05/04/2022 8.8 (L)  8.9 - 10.3 mg/dL Final   Total Protein 05/04/2022 7.5  6.5 - 8.1 g/dL Final   Albumin 05/04/2022 4.3  3.5 - 5.0 g/dL Final   AST 05/04/2022 19  15 - 41 U/L Final   ALT 05/04/2022 21  0 - 44 U/L Final   Alkaline Phosphatase 05/04/2022 66  38 - 126 U/L Final   Total Bilirubin 05/04/2022 0.4  0.3 - 1.2 mg/dL Final   GFR, Estimated 05/04/2022 >60  >60 mL/min Final   Comment: (NOTE) Calculated using the CKD-EPI Creatinine Equation (2021)    Anion gap 05/04/2022 8  5 - 15 Final   Performed at [facility], [place] [address] [place], [place] [address]   Opiates 05/03/2022 NONE DETECTED  NONE DETECTED Final   Cocaine 05/03/2022 NONE DETECTED  NONE DETECTED Final   Benzodiazepines 05/03/2022 NONE DETECTED  NONE DETECTED Final   Amphetamines 05/03/2022 NONE DETECTED  NONE DETECTED Final   Tetrahydrocannabinol 05/03/2022 NONE DETECTED  NONE DETECTED Final   Barbiturates 05/03/2022 NONE DETECTED  NONE DETECTED Final   Comment: (NOTE) DRUG SCREEN FOR MEDICAL PURPOSES ONLY.  IF CONFIRMATION IS NEEDED FOR ANY PURPOSE, NOTIFY LAB WITHIN 5 DAYS.  LOWEST DETECTABLE LIMITS FOR URINE DRUG SCREEN Drug Class                     Cutoff (ng/mL) Amphetamine and metabolites    1000 Barbiturate and metabolites    200 Benzodiazepine                 200 Opiates and metabolites        300 Cocaine and metabolites        300 THC                             50 Performed at [facility], [place] [address], [place], [place] [address]    Alcohol, Ethyl (B) 05/04/2022 <10  <10 mg/dL Final   Comment: (NOTE) Lowest detectable limit for serum alcohol is 10 mg/dL.  For medical purposes only. Performed at [facility], [place] [address], [place],  [address]    I-stat hCG,  quantitative 05/04/2022 <5.0  <5 mIU/mL Final   Comment 3 05/04/2022          Final   Comment:   GEST. AGE      CONC.  (mIU/mL)   <=1 WEEK        5 - 50     2 WEEKS       50 - 500     3 WEEKS       100 - 10,000     4 WEEKS     1,000 - 30,000        FEMALE AND NON-PREGNANT FEMALE:     LESS THAN 5 mIU/mL    SARS Coronavirus 2 by RT PCR 05/04/2022 NEGATIVE  NEGATIVE Final   Comment: (NOTE) SARS-CoV-2 target nucleic acids are NOT DETECTED.  The SARS-CoV-2 RNA is generally detectable in upper respiratory specimens during the acute phase of infection. The lowest concentration of SARS-CoV-2 viral copies this assay can detect is 138 copies/mL. A negative result does not preclude SARS-Cov-2 infection and should not be used as the sole basis for treatment or other patient management decisions. A negative result may occur with  improper specimen collection/handling, submission of specimen other than nasopharyngeal swab, presence of viral mutation(s) within the areas targeted by this assay, and inadequate number of viral copies(<138 copies/mL). A negative result must be combined with clinical observations, patient history, and epidemiological information. The expected result is Negative.  Fact Sheet for Patients:  EntrepreneurPulse.com.au  Fact Sheet for Healthcare Providers:  IncredibleEmployment.be  This test is no                          t yet approved or cleared by the [place] FDA and  has been authorized for detection and/or diagnosis of SARS-CoV-2 by FDA under an Emergency Use Authorization  (EUA). This EUA will remain  in effect (meaning this test can be used) for the duration of the COVID-19 declaration under Section 564(b)(1) of the Act, 21 U.S.C.section 360bbb-3(b)(1), unless the authorization is terminated  or revoked sooner.       Influenza A by PCR 05/04/2022 NEGATIVE  NEGATIVE Final   Influenza B by PCR 05/04/2022 NEGATIVE  NEGATIVE Final   Comment: (NOTE) The Xpert Xpress SARS-CoV-2/FLU/RSV plus assay is intended as an aid in the diagnosis of influenza from Nasopharyngeal swab specimens and should not be used as a sole basis for treatment. Nasal washings and aspirates are unacceptable for Xpert Xpress SARS-CoV-2/FLU/RSV testing.  Fact Sheet for Patients: EntrepreneurPulse.com.au  Fact Sheet for Healthcare Providers: IncredibleEmployment.be  This test is not yet approved or cleared by the [place] FDA and has been authorized for detection and/or diagnosis of SARS-CoV-2 by FDA under an Emergency Use Authorization (EUA). This EUA will remain in effect (meaning this test can be used) for the duration of the COVID-19 declaration under Section 564(b)(1) of the Act, 21 U.S.C. section 360bbb-3(b)(1), unless the authorization is terminated or revoked.     Resp Syncytial Virus by PCR 05/04/2022 NEGATIVE  NEGATIVE Final   Comment: (NOTE) Fact Sheet for Patients: EntrepreneurPulse.com.au  Fact Sheet for Healthcare Providers: IncredibleEmployment.be  This test is not yet approved or cleared by the [place] FDA and has been authorized for detection and/or diagnosis of SARS-CoV-2 by FDA under an Emergency Use Authorization (EUA). This EUA will remain in effect (meaning this test can be used) for the duration of the COVID-19 declaration under Section 564(b)(1) of the Act,  21 U.S.C. section 360bbb-3(b)(1), unless the authorization is terminated or revoked.  Performed at [facility], [place] [address], [place], [place] [address]   Admission on 05/02/2022, Discharged on 05/03/2022  Component Date Value Ref Range Status   Sodium 05/02/2022 140  135 - 145 mmol/L Final   Potassium 05/02/2022 3.6  3.5 - 5.1 mmol/L Final   Chloride 05/02/2022 111  98 - 111 mmol/L Final   CO2 05/02/2022 20 (L)  22 - 32 mmol/L Final   Glucose, Bld 05/02/2022 112 (H)  70 - 99 mg/dL Final   Glucose reference range applies only to samples taken after fasting for at least 8 hours.   BUN 05/02/2022 14  6 - 20 mg/dL Final   Creatinine, Ser 05/02/2022 0.65  0.44 - 1.00 mg/dL Final   Calcium 05/02/2022 8.7 (L)  8.9 - 10.3 mg/dL Final   Total Protein 05/02/2022 7.5  6.5 - 8.1 g/dL Final   Albumin 05/02/2022 4.1  3.5 - 5.0 g/dL Final   AST 05/02/2022 21  15 - 41 U/L Final   ALT 05/02/2022 22  0 - 44 U/L Final   Alkaline Phosphatase 05/02/2022 68  38 - 126 U/L Final   Total Bilirubin 05/02/2022 0.5  0.3 - 1.2 mg/dL Final   GFR, Estimated 05/02/2022 >60  >60 mL/min Final   Comment: (NOTE) Calculated using the CKD-EPI Creatinine Equation (2021)    Anion gap 05/02/2022 9  5 - 15 Final   Performed at [facility], [place] [address] [place], [place] [address]   Alcohol, Ethyl (B) 05/02/2022 <10  <10 mg/dL Final   Comment: (NOTE) Lowest detectable limit for serum alcohol is 10 mg/dL.  For medical purposes only. Performed at [facility], [place] [address], [place], [place] [address]    WBC 05/02/2022 9.8  4.0 - 10.5 K/uL Final   RBC 05/02/2022 4.70  3.87 - 5.11 MIL/uL Final   Hemoglobin 05/02/2022 11.9 (L)  12.0 - 15.0 g/dL Final   HCT 05/02/2022 38.2  36.0 - 46.0 % Final   MCV 05/02/2022 81.3  80.0 - 100.0 fL Final   MCH 05/02/2022 25.3 (L)  26.0 - 34.0 pg Final   MCHC 05/02/2022 31.2  30.0 - 36.0 g/dL Final   RDW 05/02/2022 14.6  11.5 - 15.5 % Final   Platelets 05/02/2022 320  150 - 400 K/uL Final   nRBC 05/02/2022 0.0  0.0 - 0.2 % Final    Neutrophils Relative % 05/02/2022 60  % Final   Neutro Abs 05/02/2022 5.9  1.7 - 7.7 K/uL Final   Lymphocytes Relative 05/02/2022 33  % Final   Lymphs Abs 05/02/2022 3.2  0.7 - 4.0 K/uL Final   Monocytes Relative 05/02/2022 5  % Final   Monocytes Absolute 05/02/2022 0.5  0.1 - 1.0 K/uL Final   Eosinophils Relative 05/02/2022 1  % Final   Eosinophils Absolute 05/02/2022 0.1  0.0 - 0.5 K/uL Final   Basophils Relative 05/02/2022 1  % Final   Basophils Absolute 05/02/2022 0.1  0.0 - 0.1 K/uL Final   Immature Granulocytes 05/02/2022 0  % Final   Abs Immature Granulocytes 05/02/2022 0.03  0.00 - 0.07 K/uL Final   Performed at [facility], [place] [address], [place], [place] [address]   I-stat hCG, quantitative 05/02/2022 <5.0  <5 mIU/mL Final   Comment 3 05/02/2022          Final   Comment:   GEST. AGE  CONC.  (mIU/mL)   <=1 WEEK        5 - 50     2 WEEKS       50 - 500     3 WEEKS       100 - 10,000     4 WEEKS     1,000 - 30,000        FEMALE AND NON-PREGNANT FEMALE:     LESS THAN 5 mIU/mL    SARS Coronavirus 2 by RT PCR 05/02/2022 NEGATIVE  NEGATIVE Final   Comment: (NOTE) SARS-CoV-2 target nucleic acids are NOT DETECTED.  The SARS-CoV-2 RNA is generally detectable in upper and lower respiratory specimens during the acute phase of infection. The lowest concentration of SARS-CoV-2 viral copies this assay can detect is 250 copies / mL. A negative result does not preclude SARS-CoV-2 infection and should not be used as the sole basis for treatment or other patient management decisions.  A negative result may occur with improper specimen collection / handling, submission of specimen other than nasopharyngeal swab, presence of viral mutation(s) within the areas targeted by this assay, and inadequate number of viral copies (<250 copies / mL). A negative result must be combined with clinical observations, patient history, and epidemiological information.  Fact Sheet  for Patients:   https://www.patel.info/  Fact Sheet for Healthcare Providers: https://hall.com/  This test is not yet approved or                           cleared by the [place] FDA and has been authorized for detection and/or diagnosis of SARS-CoV-2 by FDA under an Emergency Use Authorization (EUA).  This EUA will remain in effect (meaning this test can be used) for the duration of the COVID-19 declaration under Section 564(b)(1) of the Act, 21 U.S.C. section 360bbb-3(b)(1), unless the authorization is terminated or revoked sooner.  Performed at [facility] [place] [address] [place], [place] [address]   Admission on 04/19/2022, Discharged on 04/30/2022  Component Date Value Ref Range Status   Hgb A1c MFr Bld 04/20/2022 5.2  4.8 - 5.6 % Final   Comment: (NOTE)         Prediabetes: 5.7 - 6.4         Diabetes: >6.4         Glycemic control for adults with diabetes: <7.0    Mean Plasma Glucose 04/20/2022 103  mg/dL Final   Comment: (NOTE) Performed At: [facility] [place], [place] [id number] Rush Farmer MD [id number]    TSH 04/20/2022 1.838  0.350 - 4.500 uIU/mL Final   Comment: Performed by a 3rd Generation assay with a functional sensitivity of <=0.01 uIU/mL. Performed at [facility], [place] [address] [place], [place] [address]    Cholesterol 04/20/2022 191  0 - 200 mg/dL Final   Triglycerides 04/20/2022 163 (H)  <150 mg/dL Final   HDL 04/20/2022 52  >40 mg/dL Final   Total CHOL/HDL Ratio 04/20/2022 3.7  RATIO Final   VLDL 04/20/2022 33  0 - 40 mg/dL Final   LDL Cholesterol 04/20/2022 106 (H)  0 - 99 mg/dL Final   Comment:        Total Cholesterol/HDL:CHD Risk Coronary Heart Disease Risk Table                     Men   Women  1/2 Average Risk   3.4  3.3  Average Risk       5.0   4.4  2 X Average Risk   9.6   7.1  3 X Average Risk  23.4   11.0         Use the calculated Patient Ratio above and the CHD Risk Table to determine the patient's CHD Risk.        ATP III CLASSIFICATION (LDL):  <100     mg/dL   Optimal  100-129  mg/dL   Near or Above                    Optimal  130-159  mg/dL   Borderline  160-189  mg/dL   High  >190     mg/dL   Very High Performed at [place] [address], [place], [place] [address]   Admission on 04/18/2022, Discharged on 04/19/2022  Component Date Value Ref Range Status   Sodium 04/18/2022 138  135 - 145 mmol/L Final   Potassium 04/18/2022 3.8  3.5 - 5.1 mmol/L Final   Chloride 04/18/2022 112 (H)  98 - 111 mmol/L Final   CO2 04/18/2022 20 (L)  22 - 32 mmol/L Final   Glucose, Bld 04/18/2022 95  70 - 99 mg/dL Final   Glucose reference range applies only to samples taken after fasting for at least 8 hours.   BUN 04/18/2022 10  6 - 20 mg/dL Final   Creatinine, Ser 04/18/2022 0.77  0.44 - 1.00 mg/dL Final   Calcium 04/18/2022 8.8 (L)  8.9 - 10.3 mg/dL Final   Total Protein 04/18/2022 6.8  6.5 - 8.1 g/dL Final   Albumin 04/18/2022 4.2  3.5 - 5.0 g/dL Final   AST 04/18/2022 19  15 - 41 U/L Final   ALT 04/18/2022 15  0 - 44 U/L Final   Alkaline Phosphatase 04/18/2022 61  38 - 126 U/L Final   Total Bilirubin 04/18/2022 0.5  0.3 - 1.2 mg/dL Final   GFR, Estimated 04/18/2022 >60  >60 mL/min Final   Comment: (NOTE) Calculated using the CKD-EPI Creatinine Equation (2021)    Anion gap 04/18/2022 6  5 - 15 Final   Performed at [facility], [place] [address] [place], [place] [address]   Alcohol, Ethyl (B) 04/18/2022 <10  <10 mg/dL Final   Comment: (NOTE) Lowest detectable limit for serum alcohol is 10 mg/dL.  For medical purposes only. Performed at [facility], [place] [address], [place], [place] [id number]    Salicylate Lvl [id number] <7.0 (L)  7.0 - 30.0 mg/dL Final   Performed at [place] [address]  [place], [place] [address]   Acetaminophen (Tylenol), Serum 04/18/2022 <10 (L)  10 - 30 ug/mL Final   Comment: (NOTE) Therapeutic concentrations vary significantly. A range of 10-30 ug/mL  may be an effective concentration for many patients. However, some  are best treated at concentrations outside of this range. Acetaminophen concentrations >150 ug/mL at 4 hours after ingestion  and >50 ug/mL at 12 hours after ingestion are often associated with  toxic reactions.  Performed at [facility], [place] [address], [place], [place] [address]    WBC 04/18/2022 9.1  4.0 - 10.5 K/uL Final   RBC 04/18/2022 4.78  3.87 - 5.11 MIL/uL Final   Hemoglobin 04/18/2022 12.2  12.0 - 15.0 g/dL Final   HCT 04/18/2022 39.6  36.0 - 46.0 % Final   MCV 04/18/2022 82.8  80.0 - 100.0 fL Final   MCH 04/18/2022 25.5 (L)  26.0 - 34.0 pg Final   MCHC 04/18/2022 30.8  30.0 - 36.0 g/dL Final   RDW 04/18/2022 14.6  11.5 - 15.5 % Final   Platelets 04/18/2022 237  150 - 400 K/uL Final   nRBC 04/18/2022 0.0  0.0 - 0.2 % Final   Performed at [facility], [place] [address] [place], [place] [address]   Opiates 04/18/2022 NONE DETECTED  NONE DETECTED Final   Cocaine 04/18/2022 NONE DETECTED  NONE DETECTED Final   Benzodiazepines 04/18/2022 NONE DETECTED  NONE DETECTED Final   Amphetamines 04/18/2022 NONE DETECTED  NONE DETECTED Final   Tetrahydrocannabinol 04/18/2022 NONE DETECTED  NONE DETECTED Final   Barbiturates 04/18/2022 NONE DETECTED  NONE DETECTED Final   Comment: (NOTE) DRUG SCREEN FOR MEDICAL PURPOSES ONLY.  IF CONFIRMATION IS NEEDED FOR ANY PURPOSE, NOTIFY LAB WITHIN 5 DAYS.  LOWEST DETECTABLE LIMITS FOR URINE DRUG SCREEN Drug Class                     Cutoff (ng/mL) Amphetamine and metabolites    1000 Barbiturate and metabolites    200 Benzodiazepine                 200 Opiates and metabolites        300 Cocaine and metabolites        300 THC                             50 Performed at [facility], [place] [address], [place], [place] [address]    Preg Test, Ur 04/18/2022 NEGATIVE  NEGATIVE Final   Comment:        THE SENSITIVITY OF THIS METHODOLOGY IS >20 mIU/mL. Performed at [facility], [place] [address] [place], [place] [address]    SARS Coronavirus 2 by RT PCR 04/19/2022 NEGATIVE  NEGATIVE Final   Comment: (NOTE) SARS-CoV-2 target nucleic acids are NOT DETECTED.  The SARS-CoV-2 RNA is generally detectable in upper respiratory specimens during the acute phase of infection. The lowest concentration of SARS-CoV-2 viral copies this assay can detect is 138 copies/mL. A negative result does not preclude SARS-Cov-2 infection and should not be used as the sole basis for treatment or other patient management decisions. A negative result may occur with  improper specimen collection/handling, submission of specimen other than nasopharyngeal swab, presence of viral mutation(s) within the areas targeted by this assay, and inadequate number of viral copies(<138 copies/mL). A negative result must be combined with clinical observations, patient history, and epidemiological information. The expected result is Negative.  Fact Sheet for Patients:  EntrepreneurPulse.com.au  Fact Sheet for Healthcare Providers:  IncredibleEmployment.be  This test is no                          t yet approved or cleared by the [place] FDA and  has been authorized for detection and/or diagnosis of SARS-CoV-2 by FDA under an Emergency Use Authorization (EUA). This EUA will remain  in effect (meaning this test can be used) for the duration of the COVID-19 declaration under Section 564(b)(1) of the Act, 21 U.S.C.section 360bbb-3(b)(1), unless the authorization is terminated  or revoked sooner.       Influenza A by PCR 04/19/2022 NEGATIVE  NEGATIVE Final   Influenza B by PCR 04/19/2022 NEGATIVE  NEGATIVE Final   Comment: (NOTE) The Xpert Xpress SARS-CoV-2/FLU/RSV plus assay is intended as an aid in the diagnosis of influenza from Nasopharyngeal swab specimens and should not be used as a sole basis for treatment. Nasal washings and aspirates are unacceptable for Xpert Xpress SARS-CoV-2/FLU/RSV testing.  Fact Sheet for Patients: EntrepreneurPulse.com.au  Fact Sheet for Healthcare Providers: IncredibleEmployment.be  This test is not yet approved or cleared by the [place] FDA and has been authorized for detection and/or diagnosis of SARS-CoV-2 by FDA under an Emergency Use Authorization (EUA). This EUA will remain in effect (meaning this test can be used) for the duration of the COVID-19 declaration under Section 564(b)(1) of the Act, 21 U.S.C. section 360bbb-3(b)(1), unless the authorization is terminated or revoked.     Resp Syncytial Virus by PCR 04/19/2022 NEGATIVE  NEGATIVE Final   Comment: (NOTE) Fact Sheet for Patients: EntrepreneurPulse.com.au  Fact Sheet for Healthcare Providers: IncredibleEmployment.be  This test is not yet approved or cleared by the [place] FDA and has been authorized for detection and/or diagnosis of SARS-CoV-2 by FDA under an Emergency Use Authorization (EUA). This EUA will remain in effect (meaning this test can be used) for the duration of the COVID-19 declaration under Section 564(b)(1) of the Act, 21 U.S.C. section 360bbb-3(b)(1), unless the authorization is terminated or revoked.  Performed at [facility], [place] [address], [place], [place] [address]   Admission on 04/13/2022, Discharged on 04/14/2022  Component Date Value Ref Range Status   Sodium 04/13/2022 140  135 - 145 mmol/L Final   Potassium 04/13/2022 3.6  3.5 - 5.1 mmol/L Final   Chloride 04/13/2022 109  98 - 111 mmol/L Final   CO2 04/13/2022 26  22 - 32 mmol/L Final    Glucose, Bld 04/13/2022 100 (H)  70 - 99 mg/dL Final   Glucose reference range applies only to samples taken after fasting for at least 8 hours.   BUN 04/13/2022 6  6 - 20 mg/dL Final   Creatinine, Ser 04/13/2022 0.75  0.44 - 1.00 mg/dL Final   Calcium 04/13/2022 8.7 (L)  8.9 - 10.3 mg/dL Final   GFR, Estimated 04/13/2022 >60  >60 mL/min Final   Comment: (NOTE) Calculated using the CKD-EPI Creatinine Equation (2021)    Anion gap 04/13/2022 5  5 - 15 Final   Performed at [facility], [place] [address], [place], [place] [address]   WBC 04/13/2022 8.6  4.0 - 10.5 K/uL Final   RBC 04/13/2022 4.36  3.87 - 5.11 MIL/uL Final   Hemoglobin 04/13/2022 11.0 (L)  12.0 - 15.0 g/dL Final   HCT 04/13/2022 36.2  36.0 - 46.0 % Final   MCV 04/13/2022 83.0  80.0 - 100.0 fL Final   MCH 04/13/2022 25.2 (L)  26.0 - 34.0 pg Final   MCHC 04/13/2022 30.4  30.0 - 36.0 g/dL Final   RDW 04/13/2022 14.6  11.5 - 15.5 % Final   Platelets 04/13/2022 247  150 - 400 K/uL Final   nRBC 04/13/2022 0.0  0.0 - 0.2 % Final   Performed at [place] [address] [place], [place] [address]   Troponin I (High Sensitivity) 04/13/2022 <2  <18 ng/L Final   Comment: (NOTE) Elevated high sensitivity troponin I (hsTnI) values and significant  changes across serial measurements may suggest ACS but many other  chronic and acute conditions are known to elevate hsTnI results.  Refer to the "Links" section for chest pain algorithms and additional  guidance. Performed at [facility], [place] [address], [place], [place] [address]    I-stat hCG, quantitative 04/13/2022 <5.0  <5 mIU/mL Final   Comment 3 04/13/2022          Final   Comment:   GEST. AGE      CONC.  (mIU/mL)   <=1 WEEK        5 - 50     2 WEEKS       50 - 500     3 WEEKS       100 - 10,000     4 WEEKS     1,000 - 30,000        FEMALE AND NON-PREGNANT FEMALE:     LESS THAN 5 mIU/mL    Troponin I (High Sensitivity) 04/13/2022 <2  <18 ng/L Final    Comment: (NOTE) Elevated high sensitivity troponin I (hsTnI) values and significant  changes across serial measurements may suggest ACS but many other  chronic and acute conditions are known to elevate hsTnI results.  Refer to the "Links" section for chest pain algorithms and additional  guidance. Performed at [facility], [place] [address] [place], [place] [address]   Admission on 04/11/2022, Discharged on 04/12/2022  Component Date Value Ref Range Status   SARS Coronavirus 2 by RT PCR 04/11/2022 NEGATIVE  NEGATIVE Final   Influenza A by PCR 04/11/2022 NEGATIVE  NEGATIVE Final   Influenza B by PCR 04/11/2022 NEGATIVE  NEGATIVE Final   Comment: (NOTE) The Xpert Xpress SARS-CoV-2/FLU/RSV plus assay is intended as an aid in the diagnosis of influenza from Nasopharyngeal swab specimens and should not be used as a sole basis for treatment. Nasal washings and aspirates are unacceptable for Xpert Xpress SARS-CoV-2/FLU/RSV testing.  Fact Sheet for Patients: EntrepreneurPulse.com.au  Fact Sheet for Healthcare Providers: IncredibleEmployment.be  This test is not yet approved or cleared by the [place] FDA and has been authorized for detection and/or diagnosis of SARS-CoV-2 by FDA under an Emergency Use Authorization (EUA). This EUA will remain in effect (meaning this test can be used) for the duration of the COVID-19 declaration under Section 564(b)(1) of the Act, 21 U.S.C. section 360bbb-3(b)(1), unless the authorization is terminated or revoked.     Resp Syncytial Virus by PCR 04/11/2022 NEGATIVE  NEGATIVE Final   Comment: (NOTE) Fact Sheet for Patients: EntrepreneurPulse.com.au  Fact Sheet for Healthcare Providers: IncredibleEmployment.be  This test is not yet approved or cleared by the [place] FDA and has been authorized for detection and/or diagnosis of SARS-CoV-2 by FDA under an Emergency  Use Authorization (EUA). This EUA will remain in effect (meaning this test can be used) for the duration of the COVID-19 declaration under Section 564(b)(1) of the Act, 21 U.S.C. section 360bbb-3(b)(1), unless the authorization is terminated or revoked.  Performed at [facility], [place] [address] [place], [place] [address]    Preg Test, Ur 04/11/2022 Negative  Negative Final   POC Amphetamine UR 04/11/2022 None Detected  NONE DETECTED (Cut Off Level 1000 ng/mL) Final   POC Secobarbital (BAR) 04/11/2022 None Detected  NONE DETECTED (Cut Off Level 300 ng/mL) Final   POC Buprenorphine (BUP) 04/11/2022 None Detected  NONE DETECTED (Cut Off Level 10 ng/mL) Final   POC Oxazepam (BZO) 04/11/2022 None Detected  NONE DETECTED (Cut Off Level 300 ng/mL) Final   POC Cocaine UR 04/11/2022 None Detected  NONE DETECTED (Cut Off Level 300 ng/mL) Final   POC Methamphetamine UR 04/11/2022  None Detected  NONE DETECTED (Cut Off Level 1000 ng/mL) Final   POC Morphine 04/11/2022 None Detected  NONE DETECTED (Cut Off Level 300 ng/mL) Final   POC Methadone UR 04/11/2022 None Detected  NONE DETECTED (Cut Off Level 300 ng/mL) Final   POC Oxycodone UR 04/11/2022 None Detected  NONE DETECTED (Cut Off Level 100 ng/mL) Final   POC Marijuana UR 04/11/2022 None Detected  NONE DETECTED (Cut Off Level 50 ng/mL) Final   SARSCOV2ONAVIRUS 2 AG 04/11/2022 NEGATIVE  NEGATIVE Final   Comment: (NOTE) SARS-CoV-2 antigen NOT DETECTED.   Negative results are presumptive.  Negative results do not preclude SARS-CoV-2 infection and should not be used as the sole basis for treatment or other patient management decisions, including infection  control decisions, particularly in the presence of clinical signs and  symptoms consistent with COVID-19, or in those who have been in contact with the virus.  Negative results must be combined with clinical observations, patient history, and epidemiological information. The expected  result is Negative.  Fact Sheet for Patients: HandmadeRecipes.com.cy  Fact Sheet for Healthcare Providers: FuneralLife.at  This test is not yet approved or cleared by the [place] FDA and  has been authorized for detection and/or diagnosis of SARS-CoV-2 by FDA under an Emergency Use Authorization (EUA).  This EUA will remain in effect (meaning this test can be used) for the duration of  the COV                          ID-19 declaration under Section 564(b)(1) of the Act, 21 U.S.C. section 360bbb-3(b)(1), unless the authorization is terminated or revoked sooner.    Admission on 03/22/2022, Discharged on 03/23/2022  Component Date Value Ref Range Status   Sodium 03/22/2022 137  135 - 145 mmol/L Final   Potassium 03/22/2022 3.7  3.5 - 5.1 mmol/L Final   Chloride 03/22/2022 106  98 - 111 mmol/L Final   CO2 03/22/2022 20 (L)  22 - 32 mmol/L Final   Glucose, Bld 03/22/2022 124 (H)  70 - 99 mg/dL Final   Glucose reference range applies only to samples taken after fasting for at least 8 hours.   BUN 03/22/2022 9  6 - 20 mg/dL Final   Creatinine, Ser 03/22/2022 0.86  0.44 - 1.00 mg/dL Final   Calcium 03/22/2022 8.9  8.9 - 10.3 mg/dL Final   Total Protein 03/22/2022 6.6  6.5 - 8.1 g/dL Final   Albumin 03/22/2022 3.7  3.5 - 5.0 g/dL Final   AST 03/22/2022 23  15 - 41 U/L Final   ALT 03/22/2022 17  0 - 44 U/L Final   Alkaline Phosphatase 03/22/2022 50  38 - 126 U/L Final   Total Bilirubin 03/22/2022 0.8  0.3 - 1.2 mg/dL Final   GFR, Estimated 03/22/2022 >60  >60 mL/min Final   Comment: (NOTE) Calculated using the CKD-EPI Creatinine Equation (2021)    Anion gap 03/22/2022 11  5 - 15 Final   Performed at [facility], [place] [address], [place], [place] [address]   Alcohol, Ethyl (B) 03/22/2022 <10  <10 mg/dL Final   Comment: (NOTE) Lowest detectable limit for serum alcohol is 10 mg/dL.  For medical purposes  only. Performed at [facility], [place] [address] [place],  [address]    Opiates 03/22/2022 NONE DETECTED  NONE DETECTED Final   Cocaine 03/22/2022 NONE DETECTED  NONE DETECTED Final   Benzodiazepines 03/22/2022 NONE DETECTED  NONE  DETECTED Final   Amphetamines 03/22/2022 NONE DETECTED  NONE DETECTED Final   Tetrahydrocannabinol 03/22/2022 NONE DETECTED  NONE DETECTED Final   Barbiturates 03/22/2022 NONE DETECTED  NONE DETECTED Final   Comment: (NOTE) DRUG SCREEN FOR MEDICAL PURPOSES ONLY.  IF CONFIRMATION IS NEEDED FOR ANY PURPOSE, NOTIFY LAB WITHIN 5 DAYS.  LOWEST DETECTABLE LIMITS FOR URINE DRUG SCREEN Drug Class                     Cutoff (ng/mL) Amphetamine and metabolites    1000 Barbiturate and metabolites    200 Benzodiazepine                 200 Opiates and metabolites        300 Cocaine and metabolites        300 THC                            50 Performed at [facility], [place] [address] [place] [address], [place], [place] [address]    WBC 03/22/2022 8.0  4.0 - 10.5 K/uL Final   RBC 03/22/2022 4.44  3.87 - 5.11 MIL/uL Final   Hemoglobin 03/22/2022 11.2 (L)  12.0 - 15.0 g/dL Final   HCT 03/22/2022 36.0  36.0 - 46.0 % Final   MCV 03/22/2022 81.1  80.0 - 100.0 fL Final   MCH 03/22/2022 25.2 (L)  26.0 - 34.0 pg Final   MCHC 03/22/2022 31.1  30.0 - 36.0 g/dL Final   RDW 03/22/2022 14.6  11.5 - 15.5 % Final   Platelets 03/22/2022 234  150 - 400 K/uL Final   nRBC 03/22/2022 0.0  0.0 - 0.2 % Final   Neutrophils Relative % 03/22/2022 61  % Final   Neutro Abs 03/22/2022 4.8  1.7 - 7.7 K/uL Final   Lymphocytes Relative 03/22/2022 35  % Final   Lymphs Abs 03/22/2022 2.8  0.7 - 4.0 K/uL Final   Monocytes Relative 03/22/2022 4  % Final   Monocytes Absolute 03/22/2022 0.4  0.1 - 1.0 K/uL Final   Eosinophils Relative 03/22/2022 0  % Final   Eosinophils Absolute 03/22/2022 0.0  0.0 - 0.5 K/uL Final   Basophils Relative 03/22/2022 0  % Final    Basophils Absolute 03/22/2022 0.0  0.0 - 0.1 K/uL Final   Immature Granulocytes 03/22/2022 0  % Final   Abs Immature Granulocytes 03/22/2022 0.02  0.00 - 0.07 K/uL Final   Performed at [facility], [place] [address] [place], [place] [address]   SARS Coronavirus 2 by RT PCR 03/22/2022 NEGATIVE  NEGATIVE Final   Comment: (NOTE) SARS-CoV-2 target nucleic acids are NOT DETECTED.  The SARS-CoV-2 RNA is generally detectable in upper respiratory specimens during the acute phase of infection. The lowest concentration of SARS-CoV-2 viral copies this assay can detect is 138 copies/mL. A negative result does not preclude SARS-Cov-2 infection and should not be used as the sole basis for treatment or other patient management decisions. A negative result may occur with  improper specimen collection/handling, submission of specimen other than nasopharyngeal swab, presence of viral mutation(s) within the areas targeted by this assay, and inadequate number of viral copies(<138 copies/mL). A negative result must be combined with clinical observations, patient history, and epidemiological information. The expected result is Negative.  Fact Sheet for Patients:  EntrepreneurPulse.com.au  Fact Sheet for Healthcare Providers:  IncredibleEmployment.be  This test is no  t yet approved or cleared by the [place] and  has been authorized for detection and/or diagnosis of SARS-CoV-2 by FDA under an Emergency Use Authorization (EUA). This EUA will remain  in effect (meaning this test can be used) for the duration of the COVID-19 declaration under Section 564(b)(1) of the Act, 21 U.S.C.section 360bbb-3(b)(1), unless the authorization is terminated  or revoked sooner.       Influenza A by PCR 03/22/2022 NEGATIVE  NEGATIVE Final   Influenza B by PCR 03/22/2022 NEGATIVE  NEGATIVE Final   Comment: (NOTE) The Xpert Xpress  SARS-CoV-2/FLU/RSV plus assay is intended as an aid in the diagnosis of influenza from Nasopharyngeal swab specimens and should not be used as a sole basis for treatment. Nasal washings and aspirates are unacceptable for Xpert Xpress SARS-CoV-2/FLU/RSV testing.  Fact Sheet for Patients: EntrepreneurPulse.com.au  Fact Sheet for Healthcare Providers: IncredibleEmployment.be  This test is not yet approved or cleared by the [place] FDA and has been authorized for detection and/or diagnosis of SARS-CoV-2 by FDA under an Emergency Use Authorization (EUA). This EUA will remain in effect (meaning this test can be used) for the duration of the COVID-19 declaration under Section 564(b)(1) of the Act, 21 U.S.C. section 360bbb-3(b)(1), unless the authorization is terminated or revoked.     Resp Syncytial Virus by PCR 03/22/2022 NEGATIVE  NEGATIVE Final   Comment: (NOTE) Fact Sheet for Patients: EntrepreneurPulse.com.au  Fact Sheet for Healthcare Providers: IncredibleEmployment.be  This test is not yet approved or cleared by the [place] FDA and has been authorized for detection and/or diagnosis of SARS-CoV-2 by FDA under an Emergency Use Authorization (EUA). This EUA will remain in effect (meaning this test can be used) for the duration of the COVID-19 declaration under Section 564(b)(1) of the Act, 21 U.S.C. section 360bbb-3(b)(1), unless the authorization is terminated or revoked.  Performed at [facility], [place] [address], [place], [place] [address]   Admission on 03/10/2022, Discharged on 03/11/2022  Component Date Value Ref Range Status   SARSCOV2ONAVIRUS 2 AG 03/10/2022 NEGATIVE  NEGATIVE Final   Comment: (NOTE) SARS-CoV-2 antigen NOT DETECTED.   Negative results are presumptive.  Negative results do not preclude SARS-CoV-2 infection and should not be used as the sole basis  for treatment or other patient management decisions, including infection  control decisions, particularly in the presence of clinical signs and  symptoms consistent with COVID-19, or in those who have been in contact with the virus.  Negative results must be combined with clinical observations, patient history, and epidemiological information. The expected result is Negative.  Fact Sheet for Patients: HandmadeRecipes.com.cy  Fact Sheet for Healthcare Providers: FuneralLife.at  This test is not yet approved or cleared by the [place] FDA and  has been authorized for detection and/or diagnosis of SARS-CoV-2 by FDA under an Emergency Use Authorization (EUA).  This EUA will remain in effect (meaning this test can be used) for the duration of  the COV                          ID-19 declaration under Section 564(b)(1) of the Act, 21 U.S.C. section 360bbb-3(b)(1), unless the authorization is terminated or revoked sooner.     SARS Coronavirus 2 by RT PCR 03/10/2022 NEGATIVE  NEGATIVE Final   Influenza A by PCR 03/10/2022 NEGATIVE  NEGATIVE Final   Influenza B by PCR 03/10/2022 NEGATIVE  NEGATIVE Final   Comment: (NOTE) The Xpert Xpress  SARS-CoV-2/FLU/RSV plus assay is intended as an aid in the diagnosis of influenza from Nasopharyngeal swab specimens and should not be used as a sole basis for treatment. Nasal washings and aspirates are unacceptable for Xpert Xpress SARS-CoV-2/FLU/RSV testing.  Fact Sheet for Patients: EntrepreneurPulse.com.au  Fact Sheet for Healthcare Providers: IncredibleEmployment.be  This test is not yet approved or cleared by the [place] FDA and has been authorized for detection and/or diagnosis of SARS-CoV-2 by FDA under an Emergency Use Authorization (EUA). This EUA will remain in effect (meaning this test can be used) for the duration of the COVID-19 declaration  under Section 564(b)(1) of the Act, 21 U.S.C. section 360bbb-3(b)(1), unless the authorization is terminated or revoked.     Resp Syncytial Virus by PCR 03/10/2022 NEGATIVE  NEGATIVE Final   Comment: (NOTE) Fact Sheet for Patients: EntrepreneurPulse.com.au  Fact Sheet for Healthcare Providers: IncredibleEmployment.be  This test is not yet approved or cleared by the [place] FDA and has been authorized for detection and/or diagnosis of SARS-CoV-2 by FDA under an Emergency Use Authorization (EUA). This EUA will remain in effect (meaning this test can be used) for the duration of the COVID-19 declaration under Section 564(b)(1) of the Act, 21 U.S.C. section 360bbb-3(b)(1), unless the authorization is terminated or revoked.  Performed at [facility], [place] [address], [place], [place] [address]    WBC 03/10/2022 6.8  4.0 - 10.5 K/uL Final   RBC 03/10/2022 5.04  3.87 - 5.11 MIL/uL Final   Hemoglobin 03/10/2022 12.6  12.0 - 15.0 g/dL Final   HCT 03/10/2022 39.6  36.0 - 46.0 % Final   MCV 03/10/2022 78.6 (L)  80.0 - 100.0 fL Final   MCH 03/10/2022 25.0 (L)  26.0 - 34.0 pg Final   MCHC 03/10/2022 31.8  30.0 - 36.0 g/dL Final   RDW 03/10/2022 13.9  11.5 - 15.5 % Final   Platelets 03/10/2022 338  150 - 400 K/uL Final   nRBC 03/10/2022 0.0  0.0 - 0.2 % Final   Neutrophils Relative % 03/10/2022 52  % Final   Neutro Abs 03/10/2022 3.5  1.7 - 7.7 K/uL Final   Lymphocytes Relative 03/10/2022 42  % Final   Lymphs Abs 03/10/2022 2.8  0.7 - 4.0 K/uL Final   Monocytes Relative 03/10/2022 5  % Final   Monocytes Absolute 03/10/2022 0.4  0.1 - 1.0 K/uL Final   Eosinophils Relative 03/10/2022 1  % Final   Eosinophils Absolute 03/10/2022 0.0  0.0 - 0.5 K/uL Final   Basophils Relative 03/10/2022 0  % Final   Basophils Absolute 03/10/2022 0.0  0.0 - 0.1 K/uL Final   Immature Granulocytes 03/10/2022 0  % Final   Abs Immature Granulocytes 03/10/2022 0.02   0.00 - 0.07 K/uL Final   Performed at [facility], [place] [address], [place], [place] [address]   Sodium 03/10/2022 137  135 - 145 mmol/L Final   Potassium 03/10/2022 3.7  3.5 - 5.1 mmol/L Final   Chloride 03/10/2022 105  98 - 111 mmol/L Final   CO2 03/10/2022 25  22 - 32 mmol/L Final   Glucose, Bld 03/10/2022 80  70 - 99 mg/dL Final   Glucose reference range applies only to samples taken after fasting for at least 8 hours.   BUN 03/10/2022 7  6 - 20 mg/dL Final   Creatinine, Ser 03/10/2022 0.72  0.44 - 1.00 mg/dL Final   Calcium 03/10/2022 9.2  8.9 - 10.3 mg/dL Final   Total  Protein 03/10/2022 7.4  6.5 - 8.1 g/dL Final   Albumin 03/10/2022 4.3  3.5 - 5.0 g/dL Final   AST 03/10/2022 22  15 - 41 U/L Final   ALT 03/10/2022 17  0 - 44 U/L Final   Alkaline Phosphatase 03/10/2022 60  38 - 126 U/L Final   Total Bilirubin 03/10/2022 0.3  0.3 - 1.2 mg/dL Final   GFR, Estimated 03/10/2022 >60  >60 mL/min Final   Comment: (NOTE) Calculated using the CKD-EPI Creatinine Equation (2021)    Anion gap 03/10/2022 7  5 - 15 Final   Performed at [facility], [place] [address] [place], [place] [address]   Alcohol, Ethyl (B) 03/10/2022 <10  <10 mg/dL Final   Comment: (NOTE) Lowest detectable limit for serum alcohol is 10 mg/dL.  For medical purposes only. Performed at [facility], [place] [address], [place], [place] [address]    Preg Test, Ur 03/10/2022 NEGATIVE  NEGATIVE Final   Comment:        THE SENSITIVITY OF THIS METHODOLOGY IS >24 mIU/mL   Admission on 03/03/2022, Discharged on 03/03/2022  Component Date Value Ref Range Status   Color, Urine 03/03/2022 YELLOW  YELLOW Final   APPearance 03/03/2022 HAZY (A)  CLEAR Final   Specific Gravity, Urine 03/03/2022 1.017  1.005 - 1.030 Final   pH 03/03/2022 5.0  5.0 - 8.0 Final   Glucose, UA 03/03/2022 NEGATIVE  NEGATIVE mg/dL Final   Hgb urine dipstick 03/03/2022 NEGATIVE  NEGATIVE Final   Bilirubin Urine 03/03/2022 NEGATIVE   NEGATIVE Final   Ketones, ur 03/03/2022 5 (A)  NEGATIVE mg/dL Final   Protein, ur 03/03/2022 NEGATIVE  NEGATIVE mg/dL Final   Nitrite 03/03/2022 NEGATIVE  NEGATIVE Final   Leukocytes,Ua 03/03/2022 NEGATIVE  NEGATIVE Final   Performed at [facility], [place] [address] [place], [place] [address]  Admission on 03/02/2022, Discharged on 03/02/2022  Component Date Value Ref Range Status   SARS Coronavirus 2 by RT PCR 03/02/2022 NEGATIVE  NEGATIVE Final   Comment: (NOTE) SARS-CoV-2 target nucleic acids are NOT DETECTED.  The SARS-CoV-2 RNA is generally detectable in upper respiratory specimens during the acute phase of infection. The lowest concentration of SARS-CoV-2 viral copies this assay can detect is 138 copies/mL. A negative result does not preclude SARS-Cov-2 infection and should not be used as the sole basis for treatment or other patient management decisions. A negative result may occur with  improper specimen collection/handling, submission of specimen other than nasopharyngeal swab, presence of viral mutation(s) within the areas targeted by this assay, and inadequate number of viral copies(<138 copies/mL). A negative result must be combined with clinical observations, patient history, and epidemiological information. The expected result is Negative.  Fact Sheet for Patients:  EntrepreneurPulse.com.au  Fact Sheet for Healthcare Providers:  IncredibleEmployment.be  This test is no                          t yet approved or cleared by the [place] FDA and  has been authorized for detection and/or diagnosis of SARS-CoV-2 by FDA under an Emergency Use Authorization (EUA). This EUA will remain  in effect (meaning this test can be used) for the duration of the COVID-19 declaration under Section 564(b)(1) of the Act, 21 U.S.C.section 360bbb-3(b)(1), unless the authorization is terminated  or revoked sooner.       Influenza A by PCR  03/02/2022 POSITIVE (A)  NEGATIVE Final  Influenza B by PCR 03/02/2022 NEGATIVE  NEGATIVE Final   Comment: (NOTE) The Xpert Xpress SARS-CoV-2/FLU/RSV plus assay is intended as an aid in the diagnosis of influenza from Nasopharyngeal swab specimens and should not be used as a sole basis for treatment. Nasal washings and aspirates are unacceptable for Xpert Xpress SARS-CoV-2/FLU/RSV testing.  Fact Sheet for Patients: EntrepreneurPulse.com.au  Fact Sheet for Healthcare Providers: IncredibleEmployment.be  This test is not yet approved or cleared by the [place] FDA and has been authorized for detection and/or diagnosis of SARS-CoV-2 by FDA under an Emergency Use Authorization (EUA). This EUA will remain in effect (meaning this test can be used) for the duration of the COVID-19 declaration under Section 564(b)(1) of the Act, 21 U.S.C. section 360bbb-3(b)(1), unless the authorization is terminated or revoked.     Resp Syncytial Virus by PCR 03/02/2022 NEGATIVE  NEGATIVE Final   Comment: (NOTE) Fact Sheet for Patients: EntrepreneurPulse.com.au  Fact Sheet for Healthcare Providers: IncredibleEmployment.be  This test is not yet approved or cleared by the [place] FDA and has been authorized for detection and/or diagnosis of SARS-CoV-2 by FDA under an Emergency Use Authorization (EUA). This EUA will remain in effect (meaning this test can be used) for the duration of the COVID-19 declaration under Section 564(b)(1) of the Act, 21 U.S.C. section 360bbb-3(b)(1), unless the authorization is terminated or revoked.  Performed at [facility], [place] [address] [place], [place] [address]   There may be more visits with results that are not included.    Allergies: Bee venom, Coconut flavor, Fish allergy, Geodon [ziprasidone hcl], Haloperidol and related, Lithobid [lithium], Roxicodone  [oxycodone], Seroquel [quetiapine], Shellfish allergy, Phenergan [promethazine hcl], Prilosec [omeprazole], Sulfa antibiotics, Tegretol [carbamazepine], Prozac [fluoxetine], Tape, and Tylenol [acetaminophen]  Medications:  Facility Ordered Medications  Medication   alum & mag hydroxide-simeth (MAALOX/MYLANTA) 200-200-20 MG/5ML suspension 30 mL   magnesium hydroxide (MILK OF MAGNESIA) suspension 30 mL   traZODone (DESYREL) tablet 50 mg   hydrOXYzine (ATARAX) tablet 25 mg   albuterol (VENTOLIN HFA) 108 (90 Base) MCG/ACT inhaler 2 puff   mometasone-formoterol (DULERA) 200-5 MCG/ACT inhaler 2 puff   [START ON 05/05/2022] cariprazine (VRAYLAR) capsule 4.5 mg   [START ON 05/05/2022] escitalopram (LEXAPRO) tablet 10 mg   Melatonin CAPS 3 mg   [START ON 05/05/2022] metoprolol succinate (TOPROL-XL) 24 hr tablet 25 mg   OLANZapine (ZYPREXA) tablet 5 mg   [START ON 05/05/2022] pantoprazole (PROTONIX) EC tablet 40 mg   prazosin (MINIPRESS) capsule 1 mg   pregabalin (LYRICA) capsule 100 mg   OLANZapine zydis (ZYPREXA) disintegrating tablet 5 mg   LORazepam (ATIVAN) tablet 1 mg   PTA Medications  Medication Sig   albuterol (VENTOLIN HFA) 108 (90 Base) MCG/ACT inhaler Inhale 2 puffs into the lungs every 6 (six) hours as needed for wheezing or shortness of breath.   prazosin (MINIPRESS) 1 MG capsule Take 1 capsule (1 mg total) by mouth at bedtime for 14 days.   escitalopram (LEXAPRO) 10 MG tablet Take 1 tablet (10 mg total) by mouth daily for 14 days.   pantoprazole (PROTONIX) 40 MG tablet Take 1 tablet (40 mg total) by mouth daily for 14 days.   pregabalin (LYRICA) 100 MG capsule Take 100 mg by mouth 3 (three) times daily.   BREYNA 160-4.5 MCG/ACT inhaler Inhale 1 puff into the lungs daily.   diclofenac Sodium (VOLTAREN) 1 % GEL Apply 1 Application topically 4 (four) times daily.   metoprolol succinate (TOPROL-XL) 25 MG 24 hr  tablet Take 1 tablet (25 mg total) by mouth daily for 7 days.   hydrOXYzine  (ATARAX) 25 MG tablet Take 25 mg twice daily by mouth as needed for anxiety. Take 100 mg by mouth for sleep as needed at bedtime. (Patient taking differently: Take 25 mg by mouth as needed for anxiety.)   OLANZapine (ZYPREXA) 5 MG tablet Take 1 tablet (5 mg total) by mouth daily as needed for up to 7 days (anxiety). (Patient taking differently: Take 5 mg by mouth at bedtime.)   traZODone (DESYREL) 50 MG tablet Take 1 tablet (50 mg total) by mouth at bedtime as needed for up to 7 days for sleep. (Patient taking differently: Take 50 mg by mouth at bedtime.)   cariprazine 4.5 MG CAPS Take 1 capsule (4.5 mg total) by mouth daily for 7 days.   cyclobenzaprine (FLEXERIL) 10 MG tablet Take 10 mg by mouth daily.   Cyanocobalamin (VITAMIN B-12 PO) Take 1 capsule by mouth daily.   Melatonin 3 MG CAPS Take 3 mg by mouth at bedtime.   ibuprofen (ADVIL) 200 MG tablet Take 200 mg by mouth every 6 (six) hours as needed for moderate pain.    Medical Decision Making  Patient admitted to the [facility] continuous assessment unit for overnight observation for SI, AH and mood stabilization.   Patient's home medications restarted:  Meds ordered this encounter  Medications   alum & mag hydroxide-simeth (MAALOX/MYLANTA) 200-200-20 MG/5ML suspension 30 mL   magnesium hydroxide (MILK OF MAGNESIA) suspension 30 mL   traZODone (DESYREL) tablet 50 mg   hydrOXYzine (ATARAX) tablet 25 mg   albuterol (VENTOLIN HFA) 108 (90 Base) MCG/ACT inhaler 2 puff   mometasone-formoterol (DULERA) 200-5 MCG/ACT inhaler 2 puff   cariprazine (VRAYLAR) capsule 4.5 mg   escitalopram (LEXAPRO) tablet 10 mg   melatonin tablet 3 mg   metoprolol succinate (TOPROL-XL) 24 hr tablet 25 mg   OLANZapine (ZYPREXA) tablet 5 mg   pantoprazole (PROTONIX) EC tablet 40 mg   prazosin (MINIPRESS) capsule 1 mg   pregabalin (LYRICA) capsule 100 mg   OLANZapine zydis (ZYPREXA) disintegrating tablet 5 mg   LORazepam  (ATIVAN) tablet 1 mg      Recommendations  Based on my evaluation the patient does not appear to have an emergency medical condition.  Hailley Byers L, NP 05/04/22  3:10 PM

## 2022-05-04 NOTE — ED Notes (Signed)
Pt requested fr hydroxyzine for anxiety. Writer administered hydroxyzine 25 mg to pt.

## 2022-05-04 NOTE — Progress Notes (Addendum)
Walked with patient to room 34. She has no complaints at this time.   Placed 2 white bags in locker and a very large black overnight bag in front of locker 34. Patient is keeping her eyeglasses at bedside.

## 2022-05-05 DIAGNOSIS — F315 Bipolar disorder, current episode depressed, severe, with psychotic features: Secondary | ICD-10-CM

## 2022-05-05 DIAGNOSIS — R45851 Suicidal ideations: Secondary | ICD-10-CM

## 2022-05-05 DIAGNOSIS — R443 Hallucinations, unspecified: Secondary | ICD-10-CM | POA: Diagnosis not present

## 2022-05-05 NOTE — Progress Notes (Signed)
PT had viral testing as she was being evaluated for mental health problems and facilities need negative viral testing so they are ordered on all mental health eval patients.

## 2022-05-05 NOTE — ED Provider Notes (Signed)
FBC/OBS ASAP Discharge Summary  Date and Time: 05/05/2022 7:41 AM  Name: [name]  MRN:  [id number]   Discharge Diagnoses:  Final diagnoses:  Bipolar I disorder, current or most recent episode depressed, with psychotic features ([place])  Suicidal ideation    Subjective: [name] is a 32 year old Caucasian female well-known to this service.  She has a charted history with  Schizoaffective disorder, PTSD, Borderline personality and autism spectrum disorder.  Currently followed by ACT services for medication management.  Today she is denying suicidal or homicidal ideations.  Denies auditory or visual hallucinations.  Patient is requesting to discharge home.  States over stressors is related to her significant other who resides in the home with her.   [name] is sitting in no acute distress. She is alert/oriented x 4; calm/cooperative; and mood congruent with affect. She is speaking in a clear tone at moderate volume, and normal pace; with good eye contact. Her thought process is coherent and relevant; There is no indication that   She is currently responding to internal/external stimuli or experiencing delusional thought content; and she has denied suicidal/self-harm/homicidal ideation, psychosis, and paranoia. Patient has remained calm throughout assessment and has answered questions appropriately.    At this time [name] is educated and verbalizes understanding of mental health resources and other crisis services in the community.She is instructed to call 911 and present to the nearest emergency room should she experience any suicidal/homicidal ideation, auditory/visual/hallucinations, or detrimental worsening of her mental health condition. She was a also advised by Probation officer that she could call the toll-free phone on insurance card to assist with identifying in network counselors and agencies or number on back of Medicaid card to speak with care  coordinator   Stay Summary:   Total Time spent with patient: 15 minutes  Past Psychiatric History: per previously charted information: " Previous Psych Diagnoses: Schizoaffective disorder bipolar type, Anxiety, Autism spectrum disorder, PTSD, Borderline personality disorder Prior inpatient treatment: multiple Current/prior outpatient treatment: [facility] Prior rehab hx: denies multiple Psychotherapy hx: [facility] History of suicide: 20-30 times by overdosing on pills, cutting or drinking 409 disinfectant History of homicide or aggression: denies Psychiatric medication history:  Previously trialed Tegretol, Haldol, Clozaril, Depakote, Invega, Lamictal, Seroquel, asenapine, abilify, vraylar, Lexapro, Zoloft  Neuromodulation history: denies"   Current Medications:  Current Facility-Administered Medications  Medication Dose Route Frequency Provider Last Rate Last Admin   albuterol (VENTOLIN HFA) 108 (90 Base) MCG/ACT inhaler 2 puff  2 puff Inhalation Q6H PRN White, Patrice L, NP       alum & mag hydroxide-simeth (MAALOX/MYLANTA) 200-200-20 MG/5ML suspension 30 mL  30 mL Oral Q4H PRN White, Patrice L, NP       cariprazine (VRAYLAR) capsule 4.5 mg  4.5 mg Oral Daily White, Patrice L, NP       diphenhydrAMINE (BENADRYL) injection 50 mg  50 mg Intramuscular Once PRN White, Patrice L, NP       escitalopram (LEXAPRO) tablet 10 mg  10 mg Oral Daily White, Patrice L, NP       hydrOXYzine (ATARAX) tablet 25 mg  25 mg Oral BID PRN White, Patrice L, NP   25 mg at 05/04/22 1939   LORazepam (ATIVAN) tablet 1 mg  1 mg Oral Q6H PRN White, Patrice L, NP       magnesium hydroxide (MILK OF MAGNESIA) suspension 30 mL  30 mL Oral Daily PRN White, Patrice L, NP       melatonin tablet 3  mg  3 mg Oral QHS PRN White, Patrice L, NP       metoprolol succinate (TOPROL-XL) 24 hr tablet 25 mg  25 mg Oral Daily White, Patrice L, NP       mometasone-formoterol (DULERA) 200-5 MCG/ACT inhaler 2 puff  2 puff Inhalation BID  White, Patrice L, NP   2 puff at 05/04/22 1939   OLANZapine (ZYPREXA) injection 10 mg  10 mg Intramuscular Once PRN White, Patrice L, NP       OLANZapine (ZYPREXA) tablet 5 mg  5 mg Oral QHS White, Patrice L, NP   5 mg at 05/04/22 2106   pantoprazole (PROTONIX) EC tablet 40 mg  40 mg Oral Daily White, Patrice L, NP       prazosin (MINIPRESS) capsule 1 mg  1 mg Oral QHS White, Patrice L, NP   1 mg at 05/04/22 2106   pregabalin (LYRICA) capsule 100 mg  100 mg Oral TID White, Patrice L, NP   100 mg at 05/04/22 2107   traZODone (DESYREL) tablet 50 mg  50 mg Oral QHS PRN White, Patrice L, NP   50 mg at 05/04/22 2109   Current Outpatient Medications  Medication Sig Dispense Refill   albuterol (VENTOLIN HFA) 108 (90 Base) MCG/ACT inhaler Inhale 2 puffs into the lungs every 6 (six) hours as needed for wheezing or shortness of breath.     BREYNA 160-4.5 MCG/ACT inhaler Inhale 1 puff into the lungs daily.     cariprazine 4.5 MG CAPS Take 1 capsule (4.5 mg total) by mouth daily for 7 days. 7 capsule 0   Cyanocobalamin (VITAMIN B-12 PO) Take 1 capsule by mouth at bedtime.     cyclobenzaprine (FLEXERIL) 10 MG tablet Take 10 mg by mouth at bedtime.     diclofenac Sodium (VOLTAREN) 1 % GEL Apply 1 Application topically 4 (four) times daily.     escitalopram (LEXAPRO) 10 MG tablet Take 1 tablet (10 mg total) by mouth daily for 14 days. 14 tablet 0   hydrOXYzine (ATARAX) 25 MG tablet Take 25 mg twice daily by mouth as needed for anxiety. Take 100 mg by mouth for sleep as needed at bedtime. (Patient taking differently: Take 25-100 mg by mouth See admin instructions. Take one tablet daily as needed for anxiety and four tablets at bedtime as needed for sleep.) 7 tablet 0   ibuprofen (ADVIL) 200 MG tablet Take 200 mg by mouth every 6 (six) hours as needed for moderate pain.     Melatonin 3 MG CAPS Take 3 mg by mouth at bedtime.     metoprolol succinate (TOPROL-XL) 25 MG 24 hr tablet Take 1 tablet (25 mg total) by  mouth daily for 7 days. 7 tablet 0   OLANZapine (ZYPREXA) 5 MG tablet Take 1 tablet (5 mg total) by mouth daily as needed for up to 7 days (anxiety). (Patient taking differently: Take 5 mg by mouth at bedtime.) 7 tablet 0   pantoprazole (PROTONIX) 40 MG tablet Take 1 tablet (40 mg total) by mouth daily for 14 days. 14 tablet 0   prazosin (MINIPRESS) 1 MG capsule Take 1 capsule (1 mg total) by mouth at bedtime for 14 days. 14 capsule 0   pregabalin (LYRICA) 100 MG capsule Take 100 mg by mouth 3 (three) times daily.     traZODone (DESYREL) 50 MG tablet Take 1 tablet (50 mg total) by mouth at bedtime as needed for up to 7 days for sleep. (Patient taking differently: Take  50 mg by mouth at bedtime.) 7 tablet 0    PTA Medications:  Facility Ordered Medications  Medication   alum & mag hydroxide-simeth (MAALOX/MYLANTA) 200-200-20 MG/5ML suspension 30 mL   magnesium hydroxide (MILK OF MAGNESIA) suspension 30 mL   traZODone (DESYREL) tablet 50 mg   hydrOXYzine (ATARAX) tablet 25 mg   albuterol (VENTOLIN HFA) 108 (90 Base) MCG/ACT inhaler 2 puff   mometasone-formoterol (DULERA) 200-5 MCG/ACT inhaler 2 puff   cariprazine (VRAYLAR) capsule 4.5 mg   escitalopram (LEXAPRO) tablet 10 mg   melatonin tablet 3 mg   metoprolol succinate (TOPROL-XL) 24 hr tablet 25 mg   OLANZapine (ZYPREXA) tablet 5 mg   pantoprazole (PROTONIX) EC tablet 40 mg   prazosin (MINIPRESS) capsule 1 mg   pregabalin (LYRICA) capsule 100 mg   LORazepam (ATIVAN) tablet 1 mg   OLANZapine (ZYPREXA) injection 10 mg   diphenhydrAMINE (BENADRYL) injection 50 mg   [COMPLETED] diclofenac Sodium (VOLTAREN) 1 % topical gel 2 g   PTA Medications  Medication Sig   albuterol (VENTOLIN HFA) 108 (90 Base) MCG/ACT inhaler Inhale 2 puffs into the lungs every 6 (six) hours as needed for wheezing or shortness of breath.   prazosin (MINIPRESS) 1 MG capsule Take 1 capsule (1 mg total) by mouth at bedtime for 14 days.   escitalopram (LEXAPRO) 10 MG  tablet Take 1 tablet (10 mg total) by mouth daily for 14 days.   pantoprazole (PROTONIX) 40 MG tablet Take 1 tablet (40 mg total) by mouth daily for 14 days.   pregabalin (LYRICA) 100 MG capsule Take 100 mg by mouth 3 (three) times daily.   BREYNA 160-4.5 MCG/ACT inhaler Inhale 1 puff into the lungs daily.   diclofenac Sodium (VOLTAREN) 1 % GEL Apply 1 Application topically 4 (four) times daily.   metoprolol succinate (TOPROL-XL) 25 MG 24 hr tablet Take 1 tablet (25 mg total) by mouth daily for 7 days.   hydrOXYzine (ATARAX) 25 MG tablet Take 25 mg twice daily by mouth as needed for anxiety. Take 100 mg by mouth for sleep as needed at bedtime. (Patient taking differently: Take 25-100 mg by mouth See admin instructions. Take one tablet daily as needed for anxiety and four tablets at bedtime as needed for sleep.)   OLANZapine (ZYPREXA) 5 MG tablet Take 1 tablet (5 mg total) by mouth daily as needed for up to 7 days (anxiety). (Patient taking differently: Take 5 mg by mouth at bedtime.)   traZODone (DESYREL) 50 MG tablet Take 1 tablet (50 mg total) by mouth at bedtime as needed for up to 7 days for sleep. (Patient taking differently: Take 50 mg by mouth at bedtime.)   cariprazine 4.5 MG CAPS Take 1 capsule (4.5 mg total) by mouth daily for 7 days.   cyclobenzaprine (FLEXERIL) 10 MG tablet Take 10 mg by mouth at bedtime.   Cyanocobalamin (VITAMIN B-12 PO) Take 1 capsule by mouth at bedtime.   Melatonin 3 MG CAPS Take 3 mg by mouth at bedtime.   ibuprofen (ADVIL) 200 MG tablet Take 200 mg by mouth every 6 (six) hours as needed for moderate pain.       03/10/2022   10:01 PM 03/10/2022    9:56 PM 02/10/2022    1:49 AM  Depression screen PHQ 2/9  Decreased Interest 1 1 2   Down, Depressed, Hopeless 1 1 2   PHQ - 2 Score 2 2 4   Altered sleeping 1 1 2   Tired, decreased energy 1 1 2   Change  in appetite 1 0 2  Feeling bad or failure about yourself  1 1 2   Trouble concentrating 1 1 2   Moving slowly or  fidgety/restless 1 1 1   Suicidal thoughts 1 1 1   PHQ-9 Score 9 8 16   Difficult doing work/chores Somewhat difficult  Very difficult    Flowsheet Row ED from 05/04/2022 in [facility] ED from 05/03/2022 in [facility] Emergency Department at [facility] ED from 05/02/2022 in [facility] Emergency Department at [place] CATEGORY High Risk High Risk High Risk       Musculoskeletal  Strength & Muscle Tone: within normal limits Gait & Station: normal Patient leans: N/A  Psychiatric Specialty Exam  Presentation  General Appearance:  Appropriate for Environment  Eye Contact: Fair  Speech: Clear and Coherent  Speech Volume: Normal  Handedness: Right   Mood and Affect  Mood: Dysphoric  Affect: Congruent   Thought Process  Thought Processes: Linear  Descriptions of Associations:Intact  Orientation:Full (Time, Place and Person)  Thought Content:Illogical  Diagnosis of Schizophrenia or Schizoaffective disorder in past: Yes  Duration of Psychotic Symptoms: Greater than six months   Hallucinations:Hallucinations: Auditory Description of Auditory Hallucinations: voices of parents telling her to kill herself  Ideas of Reference:None  Suicidal Thoughts:Suicidal Thoughts: Yes, Active SI Active Intent and/or Plan: With Plan  Homicidal Thoughts:Homicidal Thoughts: Yes, Passive HI Passive Intent and/or Plan: Without Plan   Sensorium  Memory: Immediate Fair; Recent Fair; Remote Fair  Judgment: Poor  Insight: Poor; Lacking   Executive Functions  Concentration: Fair  Attention Span: Fair  Recall: [employer] of Knowledge: Fair  Language: Fair   Psychomotor Activity  Psychomotor Activity: Psychomotor Activity: Normal   Assets  Assets: Armed forces logistics/support/administrative officer; Housing; Physical Health; Social Support   Sleep  Sleep: Sleep: Fair Number of Hours of Sleep: 7   Nutritional  Assessment (For OBS and FBC admissions only) Has the patient had a weight loss or gain of 10 pounds or more in the last 3 months?: No Has the patient had a decrease in food intake/or appetite?: Yes Does the patient have dental problems?: No Does the patient have eating habits or behaviors that may be indicators of an eating disorder including binging or inducing vomiting?: No Has the patient recently lost weight without trying?: 1 Has the patient been eating poorly because of a decreased appetite?: 1 Malnutrition Screening Tool Score: 2    Physical Exam  Physical Exam Vitals and nursing note reviewed.  Neurological:     Mental Status: She is alert.  Psychiatric:        Mood and Affect: Mood normal.        Thought Content: Thought content normal.    Review of Systems  Psychiatric/Behavioral:  Negative for depression and suicidal ideas. The patient is not nervous/anxious.   All other systems reviewed and are negative.  Blood pressure 113/70, pulse 87, temperature 97.8 F (36.6 C), temperature source Oral, resp. rate 16, last menstrual period 04/25/2022, SpO2 100 %. There is no height or weight on file to calculate BMI.  Demographic Factors:  Caucasian  Loss Factors: NA  Historical Factors: Impulsivity  Risk Reduction Factors:   Positive social support and Positive therapeutic relationship  Continued Clinical Symptoms:  Severe Anxiety and/or Agitation  Cognitive Features That Contribute To Risk:  Closed-mindedness    Suicide Risk:  Minimal: No identifiable suicidal ideation.  Patients presenting with no risk factors but with morbid ruminations;  may be classified as minimal risk based on the severity of the depressive symptoms  Plan Of Care/Follow-up recommendations:  Activity:  as tolerated Diet:  heart healthy  Disposition: Take all medications as prescribed. Keep all follow-up appointments as scheduled.  Do not consume alcohol or use illegal drugs while on  prescription medications. Report any adverse effects from your medications to your primary care provider promptly.  In the event of recurrent symptoms or worsening symptoms, call 911, a crisis hotline, or go to the nearest emergency department for evaluation.   Derrill Center, NP 05/05/2022, 7:41 AM

## 2022-05-05 NOTE — ED Notes (Addendum)
Pt up reading a book she is calm and cooperative. No c/o pain or distress  Will continue to monitor for safety

## 2022-05-05 NOTE — Discharge Instructions (Addendum)
Take all medications as prescribed. Keep all follow-up appointments as scheduled.  Do not consume alcohol or use illegal drugs while on prescription medications. Report any adverse effects from your medications to your primary care provider promptly.  In the event of recurrent symptoms or worsening symptoms, call 911, a crisis hotline, or go to the nearest emergency department for evaluation.   

## 2022-05-05 NOTE — ED Notes (Signed)
Patient A&O x 4, ambulatory. Patient discharged in no acute distress. Patient denied SI/HI, A/VH upon discharge. Patient verbalized understanding of all discharge instructions explained by staff, to include follow up appointments, RX's and safety plan. Patient reported mood 10/10.  Pt belongings returned to patient from locker # 23 intact. Patient escorted to lobby via staff for transport to destination. Safety maintained.  

## 2022-05-05 NOTE — ED Notes (Signed)
Patient A&Ox4. Patient denies SI/HI and AVH. Patient denies any physical complaints when asked. No acute distress noted. Support and encouragement provided. Routine safety checks conducted according to facility protocol. Encouraged patient to notify staff if thoughts of harm toward self or others arise. Patient verbalize understanding and agreement. Will continue to monitor for safety.    

## 2022-05-06 ENCOUNTER — Encounter (HOSPITAL_COMMUNITY): Payer: Self-pay

## 2022-05-06 ENCOUNTER — Emergency Department (HOSPITAL_COMMUNITY)
Admission: EM | Admit: 2022-05-06 | Discharge: 2022-05-06 | Disposition: A | Discharge status: Other Acute Inpatient Hospital | Payer: Medicare PPO | Attending: Emergency Medicine | Admitting: Emergency Medicine

## 2022-05-06 ENCOUNTER — Ambulatory Visit (INDEPENDENT_AMBULATORY_CARE_PROVIDER_SITE_OTHER)
Admission: EM | Admit: 2022-05-06 | Discharge: 2022-05-07 | Disposition: A | Discharge status: Home / Self Care | Payer: Medicare PPO | Source: Home / Self Care

## 2022-05-06 ENCOUNTER — Other Ambulatory Visit: Payer: Self-pay

## 2022-05-06 DIAGNOSIS — R45851 Suicidal ideations: Secondary | ICD-10-CM

## 2022-05-06 DIAGNOSIS — R4588 Nonsuicidal self-harm: Secondary | ICD-10-CM | POA: Insufficient documentation

## 2022-05-06 DIAGNOSIS — W268XXA Contact with other sharp object(s), not elsewhere classified, initial encounter: Secondary | ICD-10-CM | POA: Diagnosis not present

## 2022-05-06 DIAGNOSIS — F603 Borderline personality disorder: Secondary | ICD-10-CM | POA: Insufficient documentation

## 2022-05-06 DIAGNOSIS — R443 Hallucinations, unspecified: Secondary | ICD-10-CM

## 2022-05-06 DIAGNOSIS — F315 Bipolar disorder, current episode depressed, severe, with psychotic features: Secondary | ICD-10-CM | POA: Insufficient documentation

## 2022-05-06 DIAGNOSIS — Z7289 Other problems related to lifestyle: Secondary | ICD-10-CM

## 2022-05-06 DIAGNOSIS — F259 Schizoaffective disorder, unspecified: Secondary | ICD-10-CM | POA: Insufficient documentation

## 2022-05-06 DIAGNOSIS — S60812A Abrasion of left wrist, initial encounter: Secondary | ICD-10-CM | POA: Diagnosis not present

## 2022-05-06 DIAGNOSIS — S6992XA Unspecified injury of left wrist, hand and finger(s), initial encounter: Secondary | ICD-10-CM | POA: Diagnosis present

## 2022-05-06 LAB — RAPID URINE DRUG SCREEN, HOSP PERFORMED
Amphetamines: NOT DETECTED
Barbiturates: NOT DETECTED
Benzodiazepines: NOT DETECTED
Cocaine: NOT DETECTED
Opiates: NOT DETECTED
Tetrahydrocannabinol: NOT DETECTED

## 2022-05-06 LAB — CBC WITH DIFFERENTIAL/PLATELET
Abs Immature Granulocytes: 0.02 10*3/uL (ref 0.00–0.07)
Basophils Absolute: 0 10*3/uL (ref 0.0–0.1)
Basophils Relative: 0 %
Eosinophils Absolute: 0.1 10*3/uL (ref 0.0–0.5)
Eosinophils Relative: 1 %
HCT: 40.3 % (ref 36.0–46.0)
Hemoglobin: 12.6 g/dL (ref 12.0–15.0)
Immature Granulocytes: 0 %
Lymphocytes Relative: 31 %
Lymphs Abs: 3.1 10*3/uL (ref 0.7–4.0)
MCH: 25.3 pg — ABNORMAL LOW (ref 26.0–34.0)
MCHC: 31.3 g/dL (ref 30.0–36.0)
MCV: 80.8 fL (ref 80.0–100.0)
Monocytes Absolute: 0.5 10*3/uL (ref 0.1–1.0)
Monocytes Relative: 5 %
Neutro Abs: 6.2 10*3/uL (ref 1.7–7.7)
Neutrophils Relative %: 63 %
Platelets: 307 10*3/uL (ref 150–400)
RBC: 4.99 MIL/uL (ref 3.87–5.11)
RDW: 14.4 % (ref 11.5–15.5)
WBC: 10 10*3/uL (ref 4.0–10.5)
nRBC: 0 % (ref 0.0–0.2)

## 2022-05-06 LAB — COMPREHENSIVE METABOLIC PANEL
ALT: 20 U/L (ref 0–44)
AST: 17 U/L (ref 15–41)
Albumin: 4.6 g/dL (ref 3.5–5.0)
Alkaline Phosphatase: 69 U/L (ref 38–126)
Anion gap: 10 (ref 5–15)
BUN: 14 mg/dL (ref 6–20)
CO2: 22 mmol/L (ref 22–32)
Calcium: 9.2 mg/dL (ref 8.9–10.3)
Chloride: 104 mmol/L (ref 98–111)
Creatinine, Ser: 0.66 mg/dL (ref 0.44–1.00)
GFR, Estimated: >60 mL/min (ref >60)
Glucose, Bld: 102 mg/dL — ABNORMAL HIGH (ref 70–99)
Potassium: 3.7 mmol/L (ref 3.5–5.1)
Sodium: 136 mmol/L (ref 135–145)
Total Bilirubin: 0.6 mg/dL (ref 0.3–1.2)
Total Protein: 8.1 g/dL (ref 6.5–8.1)

## 2022-05-06 LAB — PREGNANCY, URINE: Preg Test, Ur: NEGATIVE

## 2022-05-06 LAB — ETHANOL: Alcohol, Ethyl (B): <10 mg/dL (ref <10)

## 2022-05-06 MED ORDER — ALBUTEROL SULFATE HFA 108 (90 BASE) MCG/ACT IN AERS: 2.0000 | INHALATION_SPRAY | Freq: Four times a day (QID) | RESPIRATORY_TRACT | Status: DC | PRN

## 2022-05-06 MED ORDER — MELATONIN 3 MG PO TABS: 3.0000 mg | ORAL_TABLET | Freq: Every day | ORAL | Status: DC

## 2022-05-06 MED ORDER — FLUTICASONE FUROATE-VILANTEROL 200-25 MCG/ACT IN AEPB
1.0000 | INHALATION_SPRAY | Freq: Every day | RESPIRATORY_TRACT | Status: DC
  Filled 2022-05-06: qty 28

## 2022-05-06 MED ORDER — PREGABALIN 50 MG PO CAPS
100.0000 mg | ORAL_CAPSULE | Freq: Three times a day (TID) | ORAL | Status: DC
  Administered 2022-05-06: 100 mg via ORAL
  Filled 2022-05-06: qty 2

## 2022-05-06 MED ORDER — TRAZODONE HCL 50 MG PO TABS: 50.0000 mg | ORAL_TABLET | Freq: Every day | ORAL | Status: DC

## 2022-05-06 MED ORDER — TRAZODONE HCL 50 MG PO TABS
50.0000 mg | ORAL_TABLET | Freq: Every evening | ORAL | Status: DC | PRN
  Administered 2022-05-06: 50 mg via ORAL
  Filled 2022-05-06: qty 1

## 2022-05-06 MED ORDER — IBUPROFEN 400 MG PO TABS: 400.0000 mg | ORAL_TABLET | Freq: Two times a day (BID) | ORAL | Status: DC | PRN

## 2022-05-06 MED ORDER — PRAZOSIN HCL 1 MG PO CAPS
1.0000 mg | ORAL_CAPSULE | Freq: Every day | ORAL | Status: DC
  Administered 2022-05-06: 1 mg via ORAL
  Filled 2022-05-06: qty 1

## 2022-05-06 MED ORDER — MAGNESIUM HYDROXIDE 400 MG/5ML PO SUSP: 30.0000 mL | Freq: Every day | ORAL | Status: DC | PRN

## 2022-05-06 MED ORDER — HYDROXYZINE HCL 25 MG PO TABS
25.0000 mg | ORAL_TABLET | Freq: Once | ORAL | Status: AC
  Administered 2022-05-06: 25 mg via ORAL
  Filled 2022-05-06: qty 1

## 2022-05-06 MED ORDER — HYDROXYZINE HCL 25 MG PO TABS: 25.0000 mg | ORAL_TABLET | Freq: Two times a day (BID) | ORAL | Status: DC | PRN

## 2022-05-06 MED ORDER — HYDROXYZINE HCL 25 MG PO TABS: 25.0000 mg | ORAL_TABLET | Freq: Three times a day (TID) | ORAL | Status: DC | PRN

## 2022-05-06 MED ORDER — OLANZAPINE 5 MG PO TBDP
5.0000 mg | ORAL_TABLET | Freq: Two times a day (BID) | ORAL | Status: DC
  Administered 2022-05-06 (×2): 5 mg via ORAL
  Filled 2022-05-06 (×2): qty 1

## 2022-05-06 MED ORDER — BACITRACIN ZINC 500 UNIT/GM EX OINT
1.0000 | TOPICAL_OINTMENT | Freq: Two times a day (BID) | CUTANEOUS | Status: DC
  Administered 2022-05-06 (×2): 1 via TOPICAL
  Filled 2022-05-06: qty 28.35

## 2022-05-06 MED ORDER — PRAZOSIN HCL 1 MG PO CAPS: 1.0000 mg | ORAL_CAPSULE | Freq: Every day | ORAL | Status: DC

## 2022-05-06 MED ORDER — MELATONIN 3 MG PO TABS
3.0000 mg | ORAL_TABLET | Freq: Every day | ORAL | Status: DC
  Administered 2022-05-06: 3 mg via ORAL
  Filled 2022-05-06: qty 1

## 2022-05-06 MED ORDER — FLUTICASONE FUROATE-VILANTEROL 100-25 MCG/ACT IN AEPB: 1.0000 | INHALATION_SPRAY | Freq: Every day | RESPIRATORY_TRACT | Status: DC

## 2022-05-06 MED ORDER — HYDROXYZINE HCL 25 MG PO TABS: 25.0000 mg | ORAL_TABLET | ORAL | Status: DC

## 2022-05-06 MED ORDER — METOPROLOL SUCCINATE ER 50 MG PO TB24
25.0000 mg | ORAL_TABLET | Freq: Every day | ORAL | Status: DC
  Administered 2022-05-06: 25 mg via ORAL
  Filled 2022-05-06: qty 1

## 2022-05-06 MED ORDER — OLANZAPINE 5 MG PO TABS: 5.0000 mg | ORAL_TABLET | Freq: Every day | ORAL | Status: DC

## 2022-05-06 MED ORDER — PREGABALIN 50 MG PO CAPS
100.0000 mg | ORAL_CAPSULE | Freq: Three times a day (TID) | ORAL | Status: DC
  Administered 2022-05-06 (×2): 100 mg via ORAL
  Filled 2022-05-06 (×2): qty 2

## 2022-05-06 MED ORDER — ALUM & MAG HYDROXIDE-SIMETH 200-200-20 MG/5ML PO SUSP: 30.0000 mL | ORAL | Status: DC | PRN

## 2022-05-06 MED ORDER — MOMETASONE FURO-FORMOTEROL FUM 100-5 MCG/ACT IN AERO
2.0000 | INHALATION_SPRAY | Freq: Every day | RESPIRATORY_TRACT | Status: DC
  Administered 2022-05-06: 2 via RESPIRATORY_TRACT
  Filled 2022-05-06: qty 8.8

## 2022-05-06 NOTE — ED Notes (Signed)
Pt resting quietly, breathing is even and unlabored.  Pt denies SI, HI, pain and AVH.  Will continue to monitor for safety.  
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
## 2022-05-06 NOTE — ED Notes (Signed)
Pt brought to hall D with 2 white belongs bags and a black sports bag     Placed in the 16-22 nurses station

## 2022-05-06 NOTE — ED Notes (Signed)
Patient resting quietly in bed with eyes closed. Respirations equal and unlabored, skin warm and dry, NAD. No change in assessment or acuity. Routine safety checks conducted according to facility protocol. Will continue to monitor for safety.   

## 2022-05-06 NOTE — ED Provider Notes (Signed)
[facility] Provider Note   CSN: [id number] Arrival date & time: 05/06/22  [id number]     History  Chief Complaint  Patient presents with   Suicidal    [name] is a 32 y.o. female.  The history is provided by the patient and medical records.   32 year old female with history of adjustment disorder, Asperger syndrome, bipolar disorder, borderline personality disorder, anxiety, PTSD, presenting to the ED with suicidal ideation.  Patient was just released from [facility] yesterday after observation admission for same.  She states she does not feel any better.  She has chronic depression following her husbands death in 05/21/2016, lost her daughter due to psychiatric illness and inability to care for her.  States she is working on trying to get her daughter back, however mother in law is very obstructive and making this difficult for her.  She is also having issues finding/keeping a job due to her medical issues.  States tonight she got overwhelmed by her circumstances and began cutting at left wrist with a pair of scissors.  She denies any homicidal ideation.  She hears voices but this is chronic for her and unchanged.  She denies any visual hallucinations.  She denies any drug or alcohol use.  She reports she has been compliant with all of her medications, however is requesting dose of her Vistaril to help with her anxiety.  Home Medications Prior to Admission medications   Medication Sig Start Date End Date Taking? Authorizing Provider  albuterol (VENTOLIN HFA) 108 (90 Base) MCG/ACT inhaler Inhale 2 puffs into the lungs every 6 (six) hours as needed for wheezing or shortness of breath.    [provider]  BREYNA 160-4.5 MCG/ACT inhaler Inhale 1 puff into the lungs daily. 04/18/22   [provider]  cariprazine 4.5 MG CAPS Take 1 capsule (4.5 mg total) by mouth daily for 7 days. 05/01/22 05/08/22  Massengill, Ovid Curd, MD  Cyanocobalamin  (VITAMIN B-12 PO) Take 1 capsule by mouth at bedtime.    [provider]  diclofenac Sodium (VOLTAREN) 1 % GEL Apply 1 Application topically 4 (four) times daily.    [provider]  escitalopram (LEXAPRO) 10 MG tablet Take 1 tablet (10 mg total) by mouth daily for 14 days. 02/07/22 05/04/22  Massengill, Ovid Curd, MD  hydrOXYzine (ATARAX) 25 MG tablet Take 25 mg twice daily by mouth as needed for anxiety. Take 100 mg by mouth for sleep as needed at bedtime. Patient taking differently: Take 25-100 mg by mouth See admin instructions. Take one tablet daily as needed for anxiety and four tablets at bedtime as needed for sleep. 04/30/22   Massengill, Ovid Curd, MD  ibuprofen (ADVIL) 200 MG tablet Take 200 mg by mouth every 6 (six) hours as needed for moderate pain.    [provider]  Melatonin 3 MG CAPS Take 3 mg by mouth at bedtime.    [provider]  metoprolol succinate (TOPROL-XL) 25 MG 24 hr tablet Take 1 tablet (25 mg total) by mouth daily for 7 days. 05/01/22 05/08/22  Massengill, Ovid Curd, MD  OLANZapine (ZYPREXA) 5 MG tablet Take 1 tablet (5 mg total) by mouth daily as needed for up to 7 days (anxiety). Patient taking differently: Take 5 mg by mouth at bedtime. 04/30/22 05/07/22  Massengill, Ovid Curd, MD  pantoprazole (PROTONIX) 40 MG tablet Take 1 tablet (40 mg total) by mouth daily for 14 days. 02/07/22 05/04/22  Janine Limbo, MD  prazosin (MINIPRESS) 1  MG capsule Take 1 capsule (1 mg total) by mouth at bedtime for 14 days. 02/06/22 05/04/22  Massengill, Ovid Curd, MD  pregabalin (LYRICA) 100 MG capsule Take 100 mg by mouth 3 (three) times daily.    [provider]  traZODone (DESYREL) 50 MG tablet Take 1 tablet (50 mg total) by mouth at bedtime as needed for up to 7 days for sleep. Patient taking differently: Take 50 mg by mouth at bedtime. 04/30/22 05/07/22  Massengill, Ovid Curd, MD      Allergies    Bee venom, Coconut flavor, Fish allergy, Geodon [ziprasidone hcl],  Haloperidol and related, Lithobid [lithium], Roxicodone [oxycodone], Seroquel [quetiapine], Shellfish allergy, Phenergan [promethazine hcl], Prilosec [omeprazole], Sulfa antibiotics, Tegretol [carbamazepine], Prozac [fluoxetine], Tape, and Tylenol [acetaminophen]    Review of Systems   Review of Systems  Psychiatric/Behavioral:  Positive for suicidal ideas.   All other systems reviewed and are negative.   Physical Exam Updated Vital Signs BP (!) 145/89 (BP Location: Right Arm)   Pulse (!) 101   Temp 98 F (36.7 C) (Oral)   Resp 18   Ht 5\' 5"  (1.651 m)   Wt 86.2 kg   LMP 04/25/2022 (Exact Date)   SpO2 99%   BMI 31.62 kg/m  Physical Exam Vitals and nursing note reviewed.  Constitutional:      Appearance: She is well-developed.  HENT:     Head: Normocephalic and atraumatic.  Eyes:     Conjunctiva/sclera: Conjunctivae normal.     Pupils: Pupils are equal, round, and reactive to light.  Cardiovascular:     Rate and Rhythm: Normal rate and regular rhythm.     Heart sounds: Normal heart sounds.  Pulmonary:     Effort: Pulmonary effort is normal.     Breath sounds: Normal breath sounds.  Abdominal:     General: Bowel sounds are normal.     Palpations: Abdomen is soft.  Musculoskeletal:        General: Normal range of motion.     Cervical back: Normal range of motion.     Comments: Superficial abrasions to left volar wrist, clean without any signs of infection  Skin:    General: Skin is warm and dry.  Neurological:     Mental Status: She is alert and oriented to person, place, and time.  Psychiatric:        Attention and Perception: She perceives auditory (chronic) hallucinations. She does not perceive visual hallucinations.        Thought Content: Thought content includes suicidal ideation. Thought content does not include homicidal ideation. Thought content includes suicidal plan. Thought content does not include homicidal plan.     ED Results / Procedures / Treatments    Labs (all labs ordered are listed, but only abnormal results are displayed) Labs Reviewed  COMPREHENSIVE METABOLIC PANEL - Abnormal; Notable for the following components:      Result Value   Glucose, Bld 102 (*)    All other components within normal limits  CBC WITH DIFFERENTIAL/PLATELET - Abnormal; Notable for the following components:   MCH 25.3 (*)    All other components within normal limits  ETHANOL  RAPID URINE DRUG SCREEN, HOSP PERFORMED  PREGNANCY, URINE    EKG None  Radiology No results found.  Procedures Procedures    Medications Ordered in ED Medications  hydrOXYzine (ATARAX) tablet 25 mg (25 mg Oral Given 05/06/22 0417)    ED Course/ Medical Decision Making/ A&P  Medical Decision Making Amount and/or Complexity of Data Reviewed Labs: ordered. ECG/medicine tests: ordered and independent interpretation performed.  Risk Prescription drug management.   32 year old female presenting to the ED with suicidal ideation.  She was just discharged from [facility] yesterday after admission observation for same.  States she became overwhelmed with her circumstances tonight began cutting her left wrist with a pair of scissors.  She reports continued auditory hallucinations which are somewhat chronic for her.  Denies any homicidal ideation.  No visual hallucinations.  Wounds are superficial, does not require formal repair.  No signs of infection.  Labs were obtained from triage, no leukocytosis or electrolyte derangement.  Ethanol and UDS are negative.  She does had COVID test 2 days ago that was negative.  No current URI symptoms so do not feel this needs to be repeated.  She requested dose of her home Vistaril which was given.  Awaiting TTS consult.  TTS has evaluated-- recommends overnight observation and reassessment by psychiatry in the AM.  Day team to follow-up on recommendations.  Final Clinical Impression(s) / ED Diagnoses Final diagnoses:   Suicidal ideation  Self-mutilation    Rx / DC Orders ED Discharge Orders     None         Larene Pickett, PA-C [id number]    Delora Fuel, MD [id number] [phone]

## 2022-05-06 NOTE — ED Notes (Signed)
Patient eating dinner. Respirations equal and unlabored, skin warm and dry, NAD. No change in assessment or acuity. Routine safety checks conducted according to facility protocol. Will continue to monitor for safety.  

## 2022-05-06 NOTE — Discharge Instructions (Addendum)
Schizophrenia/Mental Health Resources ?National Suicide Prevention Lifeline ?1-800-273-TALK (8255) ?http://www.suicidepreventionlifeline.org/ ??988?-Mental Health Crisis Line ? ?National Schizophrenia Foundation ?https://sczaction.org/ ?National Institute of Mental Health ?1-866-615-6464 ?nimhinfo@nih.gov (e-mail) ?www.nimh.nih.gov ?Schizophrenia & Psychosis Action Alliance ?800-493-2094 ?info@sczaction.org ?https://sczaction.org/  ?5 Additional Healthline identified ?Best online Schizophrenia Support Groups? ?Students with Psychosis ? ?Schizophrenia Spectrum Support ? ?Supportiv ($30 monthly fee)  ? ?NAMI Connection Recovery Support Group ? ?Schizophrenia Alliance ? ?Local Resources: ?Outpatient:  ? ?[name]:  ?([phone] ?[address]   [place], [place]  [address] ?  ? ? ?https://www.[place].gov/services/[place]-[facility]-centers#contact ? ? ?[place]  ?[facility] [place] ?[address] ?[place], [place] [address] ?[phone] ? ?[place]  ?[facility] [place] ?[address] ?[place], [place]  [address] ?[phone] ?Local Resources ?(Inpatient) ? ?[place] ?[address]  ?[place], [place] [address] ?[phone] ? ?[facility] ?Behavioral Medicine Unit and Geriatric Psychiatric Unit   ?[address]  ?[place], [place] [address] ?[phone] ? ? ?[facility] ?https://naminwpiedmontnc.org/support-and-education/mental-health-education/ ? ?[facility]MI -[name] ?https://namigu[place].org/ ? ?Community Housing and Support Resources: ? ?[facility]eless[facility] ?https://pehgc.org/ ? ?Interactive Resource Center ?https://www.interactiveresourcecenter.org/ ? ?[facility] ?https://www.greensborourbanministry.org/ ? ?[facility] [place] (adult men's shelter) ?www.opendoorministrieshp.org ?[address] ?[place], [place] [address] ?[phone] ?[facility] [place] [facility] ?[address] [place], [place] [address] ?[phone] ? ? ?VA's National Homeless Call Center ?1-877-4AID VET (1-877-424-3838) ? ?Veterans Crisis Line ?1-800-273-8255 press 1 ?Confidential chat-VeteransCrisisLine.net ?Or Text to 838255 ? ?United Way ?Call 211 or 1-888-892-1162 ?www.NC211.org ? ?Affordable Housing Resources in [place] ?nchousingsearch.com   ?1-877-428-8844 ? ?Shelters ? ?[place] [facility] Housing Hotline ?336-691-9521 ?8:30am-5:30pm ?[facility] ?[address] ?[place], [place]  [address] ?336-886-5594 ?Female veterans 18+ with substance abuse issues ?Eligibility:  By Referral Only ? ?[facility] ?[address] ?, [place] [address] ?[phone] Ext. 347 ?Adult Men & Women ?Eligibility: Valid ID & Social Security Card ?www.greensborourbanministry.org ? ?[facility] ?[address] ?[place], [place]  [address] ?[phone] ?Female veterans 18+ with substance abuse issues ?Eligibility:  By Referral Only ? ?[facility] ?[address] ?[place], [place]  [address] ?[phone] ?Single women 18+ without dependents ?Open 6pm-8am ?Eligibility:  Valid ID & Social Security Card ?Call to check availability  ?http://westendministries.org/leslieshouse.aspx ? ?[facility] ?[address] ?[place], [place]  [address] ?[phone] ?Female veterans 18+ with substance abuse/mental ?health issues ?Eligibility:  By Referral Only ? ?[facility] ?[address] ?[place], [place] [address] ?[phone] ?Call to check availability ?Males 18+ ?Eligibility: Valid ID & Social Security Card ?www.odm-hp.org ? ?[facility] [place] ?[address] ?[place],  [address] ?[phone] ?Women 18+ & Families with children ?Eligibility:  Valid ID  & Criminal Background Check ?www.salvationarmy[facility]olinas.org/commands/[place] ? ? ?[facility]  (Care Management): [phone] ?[facility] [name] [facility]: Behavioral Health 24 Hour Phone Line 1-800-853-5163 ?NAMI Hotline 336-370-4264 ?NAMI [place] 919-788-0801 ? ?2-1-1 Referral Service ?United Way 211 ?Call 211 or 1-888-892-1162 ?www.NC211.org ?A free United Way, 24/7 telephone information and referral service to help link citizens who are seeking help with the community resources they need. In [name], [place], [place] and [place] counties, just dial 211 from your phone. ?Mental Health Association in  336-373-1402 ?Support groups for anxiety, depression and bipolar disorder, schizophrenia,   family and friends, aftermath of suicide, and mental wellness for Latinos (in Spanish). ?Mental Health Association in [place] 336-883-7480 ?Offers support groups, [facility] program offers. Psychological, vocational, educational and other rehabilitation services to those who suffer from mental health illness of the 18 and up (5 days a week/5hours a day), offer out patient services like diagnostic evaluation, comprehensive clinical assessments, individual counseling, group therapy, psycho-educational workshops, referral to other specialists, referral to a psychiatrist for an evaluation for medication, consultation, and outreach/training. ?[facility] ([facility]cohol [facility] Drug Services) ?([phone] ?Substance Abuse education, prevention and treatment (detox, assessments, intensive outpatient and inpatient counseling and programs). ?[facility] ?[place] (336) [phone], [place] (336) [phone] ?Support groups for posttraumatic stress, depression, and schizophrenia? as well as day programs for individuals with severe mental illness. ?[facility] [place] ([phone] ?[facility]-FREE-336-275-7896 ?[facility] 336-429-5600 or www.kellinfoundation.org ?Telehealth platform, Individual counseling across  the lifespan for both mental health and substance use, support groups, advocacy, case management, virtual villages, resource coordination. ?[facility]- 1-800-256-2452 ?[facility]-FREE-336-676-6840 or 1-800-853-5163 ?336-676-6849. Provides mental health services to all residents regardless of ability to pay. [address], [place]. [place]. 24 ?Domestic Violence Crisis Line ?[facility] ?Call for shelter and/or safety planning ?[facility] ?336-889-7273 (24/7) ?[facility] ?336-273-7273 (24/7) ? ?VA Homeless Hotline ?877-424-3838 ? ?Veterans Crisis Line ?1-800-273-8255 press 1 ?Confidential chat-VeteransCrisisLine.net ?Or Text to 838255 ? ?[facility] [place] [facility] ?[address] ?[place],  [address] ?[phone] ?Hours: Mon-Fri. 8am-5pm ?[facility] Mobile Crisis Management ?Mobile crisis response for mental health, substance abuse or intellectual/developmental disabilities ?1-877-626-1772 ? ? ? ? ?Disclaimer: This resource list is subject to change at any time and is a starting point for resource identification as of 02/03/2021.  ? ?

## 2022-05-06 NOTE — ED Notes (Signed)
Pt requested Vraylar 4.5mg  and Lexapro 10mg  to be added to her morning medications.  Pt states she has been taking these medications and wants to make sure they are continued while she is at [facility].  Provider made aware.

## 2022-05-06 NOTE — ED Provider Notes (Signed)
[facility] Continuous Assessment Admission H&P  Date: 05/06/22 Patient Name: [name] MRN: [id number] Chief Complaint:   Diagnoses:  Final diagnoses:  Passive suicidal ideations  Hallucinations  Borderline personality disorder [facility])    HPI:  [name] is a 32 y.o. female patient, voluntarily, with history of borderline personality disorder, chronic suicidal ideations, schizoaffective disorder, patient is well known to [employer] and has history of recurrent ED admissions and  was only discharged 24 hours from [facility]. Patient transferred here to [facility] from [facility].  [name], 32 y.o., female patient seen face to face by this provider, consulted with Dr. Dwyane Dee; and chart reviewed on 05/06/22.   [name] 32 y.o., female evaluated initially at [facility], transferred hre to [facility] due to on-going chronic suicidal ideations and reported AH in which she endorses hearing voices telling her to kill herself and her parents. On evaluation today, patient reports continued suicidal ideations and not knowing why she is having the thoughts so frequently. Reports thinking her medications need adjustment, however has not discussed with her outpatient psychiatric provider. Patient denies a specific plan however, endorses hearing voices telling her to kill herself and parents. [name] has history of over-utilization of emergency services due to recurrent behavioral health related complaints. [name] was recently hospitalized  at [place] 04/19/2022 and remained inpatient for period of 11 days. Despite inpatient stabilizations, [name] has continued to return frequently to the ED with a total of 25 encounters at the ED or here at [facility] within the last 6 months. [name] is established with [facility] for psychiatric services including an ACTT team and reports compliance with home psychiatric medication regimen.   During  evaluation [name] is sitting upright in no acute distress. She is alert, oriented x 4, calm, cooperative and attentive. Her mood is euthymic with congruent affect.  She has normal speech, and behavior.  Objectively there is no evidence of psychosis/mania or delusional thinking.  Patient is able to converse coherently, goal directed thoughts, no distractibility, or pre-occupation.  Patient does not appear to be responding to internal stimuli. [name] also denies suicidal/self-harm/homicidal ideation, psychosis, and paranoia.  Patient answered question appropriately.     Total Time spent with patient: 30 minutes  Musculoskeletal  Strength & Muscle Tone: within normal limits Gait & Station: normal Patient leans: N/A  Psychiatric Specialty Exam  Presentation General Appearance:  Appropriate for Environment  Eye Contact: Fair  Speech: Clear and Coherent  Speech Volume: Normal  Handedness: Right   Mood and Affect  Mood: Dysphoric  Affect: Congruent   Thought Process  Thought Processes: Coherent  Descriptions of Associations:Intact  Orientation:Full (Time, Place and Person)  Thought Content:Scattered  Diagnosis of Schizophrenia or Schizoaffective disorder in past: Yes  Duration of Psychotic Symptoms: Greater than six months  Hallucinations:Hallucinations: Auditory  Ideas of Reference:None  Suicidal Thoughts:Suicidal Thoughts: Yes, Passive SI Active Intent and/or Plan: Without Intent SI Passive Intent and/or Plan: Without Intent  Homicidal Thoughts:Homicidal Thoughts: Yes, Passive HI Passive Intent and/or Plan: Without Intent   Sensorium  Memory: Immediate Good  Judgment: Poor  Insight: Lacking   Executive Functions  Concentration: Fair  Attention Span: Fair  Recall: [employer] of Knowledge: Fair  Language: Fair   Psychomotor Activity  Psychomotor Activity: Psychomotor Activity: Normal   Assets  Assets: Music therapist; Housing; Physical Health   Sleep  Sleep: Sleep: Fair   Physical Exam HENT:  Head: Normocephalic.     Nose: Nose normal.  Eyes:     Extraocular Movements: Extraocular movements intact.     Pupils: Pupils are equal, round, and reactive to light.  Cardiovascular:     Rate and Rhythm: Normal rate.  Pulmonary:     Effort: Pulmonary effort is normal.     Breath sounds: Normal breath sounds.  Musculoskeletal:     Cervical back: Normal range of motion.  Neurological:     General: No focal deficit present.     Mental Status: She is alert.    Review of Systems  Psychiatric/Behavioral:  Positive for depression, hallucinations and suicidal ideas.     Blood pressure 111/70, pulse 99, temperature 98.7 F (37.1 C), temperature source Oral, resp. rate 18, last menstrual period 04/25/2022, SpO2 100 %. There is no height or weight on file to calculate BMI.    Is the patient at risk to self? Yes  Has the patient been a risk to self in the past 6 months? Yes .    Has the patient been a risk to self within the distant past? Yes   Is the patient a risk to others? Yes   Has the patient been a risk to others in the past 6 months? No   Has the patient been a risk to others within the distant past? No   Last Labs:  Admission on 05/06/2022, Discharged on 05/06/2022  Component Date Value Ref Range Status   Sodium 05/06/2022 136  135 - 145 mmol/L Final   Potassium 05/06/2022 3.7  3.5 - 5.1 mmol/L Final   Chloride 05/06/2022 104  98 - 111 mmol/L Final   CO2 05/06/2022 22  22 - 32 mmol/L Final   Glucose, Bld 05/06/2022 102 (H)  70 - 99 mg/dL Final   Glucose reference range applies only to samples taken after fasting for at least 8 hours.   BUN 05/06/2022 14  6 - 20 mg/dL Final   Creatinine, Ser 05/06/2022 0.66  0.44 - 1.00 mg/dL Final   Calcium 05/06/2022 9.2  8.9 - 10.3 mg/dL Final   Total Protein 05/06/2022 8.1  6.5 - 8.1 g/dL Final   Albumin 05/06/2022 4.6  3.5 - 5.0 g/dL Final    AST 05/06/2022 17  15 - 41 U/L Final   ALT 05/06/2022 20  0 - 44 U/L Final   Alkaline Phosphatase 05/06/2022 69  38 - 126 U/L Final   Total Bilirubin 05/06/2022 0.6  0.3 - 1.2 mg/dL Final   GFR, Estimated 05/06/2022 >60  >60 mL/min Final   Comment: (NOTE) Calculated using the CKD-EPI Creatinine Equation (2021)    Anion gap 05/06/2022 10  5 - 15 Final   Performed at [facility], [place] [address], [place], [place] [address]   Alcohol, Ethyl (B) 05/06/2022 <10  <10 mg/dL Final   Comment: (NOTE) Lowest detectable limit for serum alcohol is 10 mg/dL.  For medical purposes only. Performed at [facility], [place] [address], [place], [place] [address]    Opiates 05/06/2022 NONE DETECTED  NONE DETECTED Final   Cocaine 05/06/2022 NONE DETECTED  NONE DETECTED Final   Benzodiazepines 05/06/2022 NONE DETECTED  NONE DETECTED Final   Amphetamines 05/06/2022 NONE DETECTED  NONE DETECTED Final   Tetrahydrocannabinol 05/06/2022 NONE DETECTED  NONE DETECTED Final   Barbiturates 05/06/2022 NONE DETECTED  NONE DETECTED Final   Comment: (NOTE) DRUG SCREEN FOR MEDICAL PURPOSES ONLY.  IF CONFIRMATION IS NEEDED FOR ANY PURPOSE, NOTIFY  LAB WITHIN 5 DAYS.  LOWEST DETECTABLE LIMITS FOR URINE DRUG SCREEN Drug Class                     Cutoff (ng/mL) Amphetamine and metabolites    1000 Barbiturate and metabolites    200 Benzodiazepine                 200 Opiates and metabolites        300 Cocaine and metabolites        300 THC                            50 Performed at [facility], [place] [address] [place], [place] [address]    WBC 05/06/2022 10.0  4.0 - 10.5 K/uL Final   RBC 05/06/2022 4.99  3.87 - 5.11 MIL/uL Final   Hemoglobin 05/06/2022 12.6  12.0 - 15.0 g/dL Final   HCT 05/06/2022 40.3  36.0 - 46.0 % Final   MCV 05/06/2022 80.8  80.0 - 100.0 fL Final   MCH 05/06/2022 25.3 (L)  26.0 - 34.0 pg Final   MCHC 05/06/2022 31.3  30.0 - 36.0 g/dL  Final   RDW 05/06/2022 14.4  11.5 - 15.5 % Final   Platelets 05/06/2022 307  150 - 400 K/uL Final   nRBC 05/06/2022 0.0  0.0 - 0.2 % Final   Neutrophils Relative % 05/06/2022 63  % Final   Neutro Abs 05/06/2022 6.2  1.7 - 7.7 K/uL Final   Lymphocytes Relative 05/06/2022 31  % Final   Lymphs Abs 05/06/2022 3.1  0.7 - 4.0 K/uL Final   Monocytes Relative 05/06/2022 5  % Final   Monocytes Absolute 05/06/2022 0.5  0.1 - 1.0 K/uL Final   Eosinophils Relative 05/06/2022 1  % Final   Eosinophils Absolute 05/06/2022 0.1  0.0 - 0.5 K/uL Final   Basophils Relative 05/06/2022 0  % Final   Basophils Absolute 05/06/2022 0.0  0.0 - 0.1 K/uL Final   Immature Granulocytes 05/06/2022 0  % Final   Abs Immature Granulocytes 05/06/2022 0.02  0.00 - 0.07 K/uL Final   Performed at [facility], [place] [address], [place], [place] [address]   Preg Test, Ur 05/06/2022 NEGATIVE  NEGATIVE Final   Comment:        THE SENSITIVITY OF THIS METHODOLOGY IS >20 mIU/mL. Performed at [facility], [place] [address], [place], [place] [address]   Admission on 05/03/2022, Discharged on 05/04/2022  Component Date Value Ref Range Status   WBC 05/04/2022 8.2  4.0 - 10.5 K/uL Final   RBC 05/04/2022 4.89  3.87 - 5.11 MIL/uL Final   Hemoglobin 05/04/2022 12.5  12.0 - 15.0 g/dL Final   HCT 05/04/2022 40.0  36.0 - 46.0 % Final   MCV 05/04/2022 81.8  80.0 - 100.0 fL Final   MCH 05/04/2022 25.6 (L)  26.0 - 34.0 pg Final   MCHC 05/04/2022 31.3  30.0 - 36.0 g/dL Final   RDW 05/04/2022 14.6  11.5 - 15.5 % Final   Platelets 05/04/2022 290  150 - 400 K/uL Final   nRBC 05/04/2022 0.0  0.0 - 0.2 % Final   Performed at [facility], [place] [address] [place], [place] [address]   Sodium 05/04/2022 137  135 - 145 mmol/L Final   Potassium 05/04/2022 3.4 (L)  3.5 - 5.1 mmol/L Final   Chloride 05/04/2022 107  98 -  111 mmol/L Final   CO2 05/04/2022 22  22 - 32 mmol/L Final   Glucose, Bld  05/04/2022 103 (H)  70 - 99 mg/dL Final   Glucose reference range applies only to samples taken after fasting for at least 8 hours.   BUN 05/04/2022 12  6 - 20 mg/dL Final   Creatinine, Ser 05/04/2022 0.69  0.44 - 1.00 mg/dL Final   Calcium 05/04/2022 8.8 (L)  8.9 - 10.3 mg/dL Final   Total Protein 05/04/2022 7.5  6.5 - 8.1 g/dL Final   Albumin 05/04/2022 4.3  3.5 - 5.0 g/dL Final   AST 05/04/2022 19  15 - 41 U/L Final   ALT 05/04/2022 21  0 - 44 U/L Final   Alkaline Phosphatase 05/04/2022 66  38 - 126 U/L Final   Total Bilirubin 05/04/2022 0.4  0.3 - 1.2 mg/dL Final   GFR, Estimated 05/04/2022 >60  >60 mL/min Final   Comment: (NOTE) Calculated using the CKD-EPI Creatinine Equation (2021)    Anion gap 05/04/2022 8  5 - 15 Final   Performed at [facility], [place] [address], [place], [place] [address]   Opiates 05/03/2022 NONE DETECTED  NONE DETECTED Final   Cocaine 05/03/2022 NONE DETECTED  NONE DETECTED Final   Benzodiazepines 05/03/2022 NONE DETECTED  NONE DETECTED Final   Amphetamines 05/03/2022 NONE DETECTED  NONE DETECTED Final   Tetrahydrocannabinol 05/03/2022 NONE DETECTED  NONE DETECTED Final   Barbiturates 05/03/2022 NONE DETECTED  NONE DETECTED Final   Comment: (NOTE) DRUG SCREEN FOR MEDICAL PURPOSES ONLY.  IF CONFIRMATION IS NEEDED FOR ANY PURPOSE, NOTIFY LAB WITHIN 5 DAYS.  LOWEST DETECTABLE LIMITS FOR URINE DRUG SCREEN Drug Class                     Cutoff (ng/mL) Amphetamine and metabolites    1000 Barbiturate and metabolites    200 Benzodiazepine                 200 Opiates and metabolites        300 Cocaine and metabolites        300 THC                            50 Performed at [facility], [place] [address], [place], [place] [address]    Alcohol, Ethyl (B) 05/04/2022 <10  <10 mg/dL Final   Comment: (NOTE) Lowest detectable limit for serum alcohol is 10 mg/dL.  For medical purposes only. Performed at [facility], [place] [address], [place], [place] [address]    I-stat hCG, quantitative 05/04/2022 <5.0  <5 mIU/mL Final   Comment 3 05/04/2022          Final   Comment:   GEST. AGE      CONC.  (mIU/mL)   <=1 WEEK        5 - 50     2 WEEKS       50 - 500     3 WEEKS       100 - 10,000     4 WEEKS     1,000 - 30,000        FEMALE AND NON-PREGNANT FEMALE:     LESS THAN 5 mIU/mL    SARS Coronavirus 2 by RT PCR 05/04/2022 NEGATIVE  NEGATIVE Final   Comment: (NOTE) SARS-CoV-2 target nucleic acids are NOT DETECTED.  The SARS-CoV-2 RNA is  generally detectable in upper respiratory specimens during the acute phase of infection. The lowest concentration of SARS-CoV-2 viral copies this assay can detect is 138 copies/mL. A negative result does not preclude SARS-Cov-2 infection and should not be used as the sole basis for treatment or other patient management decisions. A negative result may occur with  improper specimen collection/handling, submission of specimen other than nasopharyngeal swab, presence of viral mutation(s) within the areas targeted by this assay, and inadequate number of viral copies(<138 copies/mL). A negative result must be combined with clinical observations, patient history, and epidemiological information. The expected result is Negative.  Fact Sheet for Patients:  EntrepreneurPulse.com.au  Fact Sheet for Healthcare Providers:  IncredibleEmployment.be  This test is no                          t yet approved or cleared by the [place] FDA and  has been authorized for detection and/or diagnosis of SARS-CoV-2 by FDA under an Emergency Use Authorization (EUA). This EUA will remain  in effect (meaning this test can be used) for the duration of the COVID-19 declaration under Section 564(b)(1) of the Act, 21 U.S.C.section 360bbb-3(b)(1), unless the authorization is terminated  or revoked sooner.       Influenza A by PCR  05/04/2022 NEGATIVE  NEGATIVE Final   Influenza B by PCR 05/04/2022 NEGATIVE  NEGATIVE Final   Comment: (NOTE) The Xpert Xpress SARS-CoV-2/FLU/RSV plus assay is intended as an aid in the diagnosis of influenza from Nasopharyngeal swab specimens and should not be used as a sole basis for treatment. Nasal washings and aspirates are unacceptable for Xpert Xpress SARS-CoV-2/FLU/RSV testing.  Fact Sheet for Patients: EntrepreneurPulse.com.au  Fact Sheet for Healthcare Providers: IncredibleEmployment.be  This test is not yet approved or cleared by the [place] FDA and has been authorized for detection and/or diagnosis of SARS-CoV-2 by FDA under an Emergency Use Authorization (EUA). This EUA will remain in effect (meaning this test can be used) for the duration of the COVID-19 declaration under Section 564(b)(1) of the Act, 21 U.S.C. section 360bbb-3(b)(1), unless the authorization is terminated or revoked.     Resp Syncytial Virus by PCR 05/04/2022 NEGATIVE  NEGATIVE Final   Comment: (NOTE) Fact Sheet for Patients: EntrepreneurPulse.com.au  Fact Sheet for Healthcare Providers: IncredibleEmployment.be  This test is not yet approved or cleared by the [place] FDA and has been authorized for detection and/or diagnosis of SARS-CoV-2 by FDA under an Emergency Use Authorization (EUA). This EUA will remain in effect (meaning this test can be used) for the duration of the COVID-19 declaration under Section 564(b)(1) of the Act, 21 U.S.C. section 360bbb-3(b)(1), unless the authorization is terminated or revoked.  Performed at [facility], [place] [address] [place], [place] [address]   Admission on 05/02/2022, Discharged on 05/03/2022  Component Date Value Ref Range Status   Sodium 05/02/2022 140  135 - 145 mmol/L Final   Potassium 05/02/2022 3.6  3.5 - 5.1 mmol/L Final   Chloride  05/02/2022 111  98 - 111 mmol/L Final   CO2 05/02/2022 20 (L)  22 - 32 mmol/L Final   Glucose, Bld 05/02/2022 112 (H)  70 - 99 mg/dL Final   Glucose reference range applies only to samples taken after fasting for at least 8 hours.   BUN 05/02/2022 14  6 - 20 mg/dL Final   Creatinine, Ser 05/02/2022 0.65  0.44 - 1.00 mg/dL Final   Calcium 05/02/2022  8.7 (L)  8.9 - 10.3 mg/dL Final   Total Protein 05/02/2022 7.5  6.5 - 8.1 g/dL Final   Albumin 05/02/2022 4.1  3.5 - 5.0 g/dL Final   AST 05/02/2022 21  15 - 41 U/L Final   ALT 05/02/2022 22  0 - 44 U/L Final   Alkaline Phosphatase 05/02/2022 68  38 - 126 U/L Final   Total Bilirubin 05/02/2022 0.5  0.3 - 1.2 mg/dL Final   GFR, Estimated 05/02/2022 >60  >60 mL/min Final   Comment: (NOTE) Calculated using the CKD-EPI Creatinine Equation (2021)    Anion gap 05/02/2022 9  5 - 15 Final   Performed at [facility], [place] [address] [place], [place] [address]   Alcohol, Ethyl (B) 05/02/2022 <10  <10 mg/dL Final   Comment: (NOTE) Lowest detectable limit for serum alcohol is 10 mg/dL.  For medical purposes only. Performed at [facility], [place] [address] [place], [place] [address]    WBC 05/02/2022 9.8  4.0 - 10.5 K/uL Final   RBC 05/02/2022 4.70  3.87 - 5.11 MIL/uL Final   Hemoglobin 05/02/2022 11.9 (L)  12.0 - 15.0 g/dL Final   HCT 05/02/2022 38.2  36.0 - 46.0 % Final   MCV 05/02/2022 81.3  80.0 - 100.0 fL Final   MCH 05/02/2022 25.3 (L)  26.0 - 34.0 pg Final   MCHC 05/02/2022 31.2  30.0 - 36.0 g/dL Final   RDW 05/02/2022 14.6  11.5 - 15.5 % Final   Platelets 05/02/2022 320  150 - 400 K/uL Final   nRBC 05/02/2022 0.0  0.0 - 0.2 % Final   Neutrophils Relative % 05/02/2022 60  % Final   Neutro Abs 05/02/2022 5.9  1.7 - 7.7 K/uL Final   Lymphocytes Relative 05/02/2022 33  % Final   Lymphs Abs 05/02/2022 3.2  0.7 - 4.0 K/uL Final   Monocytes Relative 05/02/2022 5  % Final   Monocytes Absolute 05/02/2022  0.5  0.1 - 1.0 K/uL Final   Eosinophils Relative 05/02/2022 1  % Final   Eosinophils Absolute 05/02/2022 0.1  0.0 - 0.5 K/uL Final   Basophils Relative 05/02/2022 1  % Final   Basophils Absolute 05/02/2022 0.1  0.0 - 0.1 K/uL Final   Immature Granulocytes 05/02/2022 0  % Final   Abs Immature Granulocytes 05/02/2022 0.03  0.00 - 0.07 K/uL Final   Performed at [facility], [place] [address] [place], [place] [address]   I-stat hCG, quantitative 05/02/2022 <5.0  <5 mIU/mL Final   Comment 3 05/02/2022          Final   Comment:   GEST. AGE      CONC.  (mIU/mL)   <=1 WEEK        5 - 50     2 WEEKS       50 - 500     3 WEEKS       100 - 10,000     4 WEEKS     1,000 - 30,000        FEMALE AND NON-PREGNANT FEMALE:     LESS THAN 5 mIU/mL    SARS Coronavirus 2 by RT PCR 05/02/2022 NEGATIVE  NEGATIVE Final   Comment: (NOTE) SARS-CoV-2 target nucleic acids are NOT DETECTED.  The SARS-CoV-2 RNA is generally detectable in upper and lower respiratory specimens during the acute phase of infection. The lowest concentration of SARS-CoV-2 viral copies this assay can detect is 250 copies /  mL. A negative result does not preclude SARS-CoV-2 infection and should not be used as the sole basis for treatment or other patient management decisions.  A negative result may occur with improper specimen collection / handling, submission of specimen other than nasopharyngeal swab, presence of viral mutation(s) within the areas targeted by this assay, and inadequate number of viral copies (<250 copies / mL). A negative result must be combined with clinical observations, patient history, and epidemiological information.  Fact Sheet for Patients:   https://www.patel.info/  Fact Sheet for Healthcare Providers: https://hall.com/  This test is not yet approved or                           cleared by the [place] FDA and has been authorized for  detection and/or diagnosis of SARS-CoV-2 by FDA under an Emergency Use Authorization (EUA).  This EUA will remain in effect (meaning this test can be used) for the duration of the COVID-19 declaration under Section 564(b)(1) of the Act, 21 U.S.C. section 360bbb-3(b)(1), unless the authorization is terminated or revoked sooner.  Performed at [facility], [place] [address], [place], [place] [address]   Admission on 04/19/2022, Discharged on 04/30/2022  Component Date Value Ref Range Status   Hgb A1c MFr Bld 04/20/2022 5.2  4.8 - 5.6 % Final   Comment: (NOTE)         Prediabetes: 5.7 - 6.4         Diabetes: >6.4         Glycemic control for adults with diabetes: <7.0    Mean Plasma Glucose 04/20/2022 103  mg/dL Final   Comment: (NOTE) Performed At: [facility] [place], [place] [id number] Rush Farmer MD [id number]    TSH 04/20/2022 1.838  0.350 - 4.500 uIU/mL Final   Comment: Performed by a 3rd Generation assay with a functional sensitivity of <=0.01 uIU/mL. Performed at [facility], [place] [address], [place], [place] [address]    Cholesterol 04/20/2022 191  0 - 200 mg/dL Final   Triglycerides 04/20/2022 163 (H)  <150 mg/dL Final   HDL 04/20/2022 52  >40 mg/dL Final   Total CHOL/HDL Ratio 04/20/2022 3.7  RATIO Final   VLDL 04/20/2022 33  0 - 40 mg/dL Final   LDL Cholesterol 04/20/2022 106 (H)  0 - 99 mg/dL Final   Comment:        Total Cholesterol/HDL:CHD Risk Coronary Heart Disease Risk Table                     Men   Women  1/2 Average Risk   3.4   3.3  Average Risk       5.0   4.4  2 X Average Risk   9.6   7.1  3 X Average Risk  23.4   11.0        Use the calculated Patient Ratio above and the CHD Risk Table to determine the patient's CHD Risk.        ATP III CLASSIFICATION (LDL):  <100     mg/dL   Optimal  100-129  mg/dL   Near or Above                    Optimal  130-159  mg/dL   Borderline  160-189   mg/dL   High  >190     mg/dL   Very High Performed at [facility]  [place] [address], [place], [place] [address]   Admission on 04/18/2022, Discharged on 04/19/2022  Component Date Value Ref Range Status   Sodium 04/18/2022 138  135 - 145 mmol/L Final   Potassium 04/18/2022 3.8  3.5 - 5.1 mmol/L Final   Chloride 04/18/2022 112 (H)  98 - 111 mmol/L Final   CO2 04/18/2022 20 (L)  22 - 32 mmol/L Final   Glucose, Bld 04/18/2022 95  70 - 99 mg/dL Final   Glucose reference range applies only to samples taken after fasting for at least 8 hours.   BUN 04/18/2022 10  6 - 20 mg/dL Final   Creatinine, Ser 04/18/2022 0.77  0.44 - 1.00 mg/dL Final   Calcium 04/18/2022 8.8 (L)  8.9 - 10.3 mg/dL Final   Total Protein 04/18/2022 6.8  6.5 - 8.1 g/dL Final   Albumin 04/18/2022 4.2  3.5 - 5.0 g/dL Final   AST 04/18/2022 19  15 - 41 U/L Final   ALT 04/18/2022 15  0 - 44 U/L Final   Alkaline Phosphatase 04/18/2022 61  38 - 126 U/L Final   Total Bilirubin 04/18/2022 0.5  0.3 - 1.2 mg/dL Final   GFR, Estimated 04/18/2022 >60  >60 mL/min Final   Comment: (NOTE) Calculated using the CKD-EPI Creatinine Equation (2021)    Anion gap 04/18/2022 6  5 - 15 Final   Performed at [facility], [place] [address], [place], [place] [address]   Alcohol, Ethyl (B) 04/18/2022 <10  <10 mg/dL Final   Comment: (NOTE) Lowest detectable limit for serum alcohol is 10 mg/dL.  For medical purposes only. Performed at [facility], [place] [address] [place], [place] [id number]    Salicylate Lvl [id number] <7.0 (L)  7.0 - 30.0 mg/dL Final   Performed at [place] [address], [place], [place] [address]   Acetaminophen (Tylenol), Serum 04/18/2022 <10 (L)  10 - 30 ug/mL Final   Comment: (NOTE) Therapeutic concentrations vary significantly. A range of 10-30 ug/mL  may be an effective concentration for many patients. However, some  are best treated at  concentrations outside of this range. Acetaminophen concentrations >150 ug/mL at 4 hours after ingestion  and >50 ug/mL at 12 hours after ingestion are often associated with  toxic reactions.  Performed at [facility], [place] [address] [place], [place] [address]    WBC 04/18/2022 9.1  4.0 - 10.5 K/uL Final   RBC 04/18/2022 4.78  3.87 - 5.11 MIL/uL Final   Hemoglobin 04/18/2022 12.2  12.0 - 15.0 g/dL Final   HCT 04/18/2022 39.6  36.0 - 46.0 % Final   MCV 04/18/2022 82.8  80.0 - 100.0 fL Final   MCH 04/18/2022 25.5 (L)  26.0 - 34.0 pg Final   MCHC 04/18/2022 30.8  30.0 - 36.0 g/dL Final   RDW 04/18/2022 14.6  11.5 - 15.5 % Final   Platelets 04/18/2022 237  150 - 400 K/uL Final   nRBC 04/18/2022 0.0  0.0 - 0.2 % Final   Performed at [facility], [place] [address] [place],  [address]   Opiates 04/18/2022 NONE DETECTED  NONE DETECTED Final   Cocaine 04/18/2022 NONE DETECTED  NONE DETECTED Final   Benzodiazepines 04/18/2022 NONE DETECTED  NONE DETECTED Final   Amphetamines 04/18/2022 NONE DETECTED  NONE DETECTED Final   Tetrahydrocannabinol 04/18/2022 NONE DETECTED  NONE DETECTED Final   Barbiturates 04/18/2022 NONE DETECTED  NONE DETECTED Final  Comment: (NOTE) DRUG SCREEN FOR MEDICAL PURPOSES ONLY.  IF CONFIRMATION IS NEEDED FOR ANY PURPOSE, NOTIFY LAB WITHIN 5 DAYS.  LOWEST DETECTABLE LIMITS FOR URINE DRUG SCREEN Drug Class                     Cutoff (ng/mL) Amphetamine and metabolites    1000 Barbiturate and metabolites    200 Benzodiazepine                 200 Opiates and metabolites        300 Cocaine and metabolites        300 THC                            50 Performed at [facility], [place] [address], [place], [place] [address]    Preg Test, Ur 04/18/2022 NEGATIVE  NEGATIVE Final   Comment:        THE SENSITIVITY OF THIS METHODOLOGY IS >20 mIU/mL. Performed at [facility], [place]  [address] [place], [place] [address]    SARS Coronavirus 2 by RT PCR 04/19/2022 NEGATIVE  NEGATIVE Final   Comment: (NOTE) SARS-CoV-2 target nucleic acids are NOT DETECTED.  The SARS-CoV-2 RNA is generally detectable in upper respiratory specimens during the acute phase of infection. The lowest concentration of SARS-CoV-2 viral copies this assay can detect is 138 copies/mL. A negative result does not preclude SARS-Cov-2 infection and should not be used as the sole basis for treatment or other patient management decisions. A negative result may occur with  improper specimen collection/handling, submission of specimen other than nasopharyngeal swab, presence of viral mutation(s) within the areas targeted by this assay, and inadequate number of viral copies(<138 copies/mL). A negative result must be combined with clinical observations, patient history, and epidemiological information. The expected result is Negative.  Fact Sheet for Patients:  EntrepreneurPulse.com.au  Fact Sheet for Healthcare Providers:  IncredibleEmployment.be  This test is no                          t yet approved or cleared by the [place] FDA and  has been authorized for detection and/or diagnosis of SARS-CoV-2 by FDA under an Emergency Use Authorization (EUA). This EUA will remain  in effect (meaning this test can be used) for the duration of the COVID-19 declaration under Section 564(b)(1) of the Act, 21 U.S.C.section 360bbb-3(b)(1), unless the authorization is terminated  or revoked sooner.       Influenza A by PCR 04/19/2022 NEGATIVE  NEGATIVE Final   Influenza B by PCR 04/19/2022 NEGATIVE  NEGATIVE Final   Comment: (NOTE) The Xpert Xpress SARS-CoV-2/FLU/RSV plus assay is intended as an aid in the diagnosis of influenza from Nasopharyngeal swab specimens and should not be used as a sole basis for treatment. Nasal washings and aspirates are unacceptable for Xpert  Xpress SARS-CoV-2/FLU/RSV testing.  Fact Sheet for Patients: EntrepreneurPulse.com.au  Fact Sheet for Healthcare Providers: IncredibleEmployment.be  This test is not yet approved or cleared by the [place] FDA and has been authorized for detection and/or diagnosis of SARS-CoV-2 by FDA under an Emergency Use Authorization (EUA). This EUA will remain in effect (meaning this test can be used) for the duration of the COVID-19 declaration under Section 564(b)(1) of the Act, 21 U.S.C. section 360bbb-3(b)(1), unless the authorization is terminated or revoked.     Resp Syncytial Virus by  PCR 04/19/2022 NEGATIVE  NEGATIVE Final   Comment: (NOTE) Fact Sheet for Patients: EntrepreneurPulse.com.au  Fact Sheet for Healthcare Providers: IncredibleEmployment.be  This test is not yet approved or cleared by the [place] FDA and has been authorized for detection and/or diagnosis of SARS-CoV-2 by FDA under an Emergency Use Authorization (EUA). This EUA will remain in effect (meaning this test can be used) for the duration of the COVID-19 declaration under Section 564(b)(1) of the Act, 21 U.S.C. section 360bbb-3(b)(1), unless the authorization is terminated or revoked.  Performed at [facility], [place] [address] [place], [place] [address]   Admission on 04/13/2022, Discharged on 04/14/2022  Component Date Value Ref Range Status   Sodium 04/13/2022 140  135 - 145 mmol/L Final   Potassium 04/13/2022 3.6  3.5 - 5.1 mmol/L Final   Chloride 04/13/2022 109  98 - 111 mmol/L Final   CO2 04/13/2022 26  22 - 32 mmol/L Final   Glucose, Bld 04/13/2022 100 (H)  70 - 99 mg/dL Final   Glucose reference range applies only to samples taken after fasting for at least 8 hours.   BUN 04/13/2022 6  6 - 20 mg/dL Final   Creatinine, Ser 04/13/2022 0.75  0.44 - 1.00 mg/dL Final   Calcium 04/13/2022 8.7 (L)  8.9 -  10.3 mg/dL Final   GFR, Estimated 04/13/2022 >60  >60 mL/min Final   Comment: (NOTE) Calculated using the CKD-EPI Creatinine Equation (2021)    Anion gap 04/13/2022 5  5 - 15 Final   Performed at [facility], [place] [address], [place], [place] [address]   WBC 04/13/2022 8.6  4.0 - 10.5 K/uL Final   RBC 04/13/2022 4.36  3.87 - 5.11 MIL/uL Final   Hemoglobin 04/13/2022 11.0 (L)  12.0 - 15.0 g/dL Final   HCT 04/13/2022 36.2  36.0 - 46.0 % Final   MCV 04/13/2022 83.0  80.0 - 100.0 fL Final   MCH 04/13/2022 25.2 (L)  26.0 - 34.0 pg Final   MCHC 04/13/2022 30.4  30.0 - 36.0 g/dL Final   RDW 04/13/2022 14.6  11.5 - 15.5 % Final   Platelets 04/13/2022 247  150 - 400 K/uL Final   nRBC 04/13/2022 0.0  0.0 - 0.2 % Final   Performed at [place] [address], [place], [place] [address]   Troponin I (High Sensitivity) 04/13/2022 <2  <18 ng/L Final   Comment: (NOTE) Elevated high sensitivity troponin I (hsTnI) values and significant  changes across serial measurements may suggest ACS but many other  chronic and acute conditions are known to elevate hsTnI results.  Refer to the "Links" section for chest pain algorithms and additional  guidance. Performed at [facility], [place] [address] [place], [place] [address]    I-stat hCG, quantitative 04/13/2022 <5.0  <5 mIU/mL Final   Comment 3 04/13/2022          Final   Comment:   GEST. AGE      CONC.  (mIU/mL)   <=1 WEEK        5 - 50     2 WEEKS       50 - 500     3 WEEKS       100 - 10,000     4 WEEKS     1,000 - 30,000        FEMALE AND NON-PREGNANT FEMALE:     LESS THAN 5 mIU/mL    Troponin  I (High Sensitivity) 04/13/2022 <2  <18 ng/L Final   Comment: (NOTE) Elevated high sensitivity troponin I (hsTnI) values and significant  changes across serial measurements may suggest ACS but many other  chronic and acute conditions are known to elevate hsTnI results.  Refer to the "Links" section for chest pain algorithms and  additional  guidance. Performed at [facility], [place] [address], [place], [place] [address]   Admission on 04/11/2022, Discharged on 04/12/2022  Component Date Value Ref Range Status   SARS Coronavirus 2 by RT PCR 04/11/2022 NEGATIVE  NEGATIVE Final   Influenza A by PCR 04/11/2022 NEGATIVE  NEGATIVE Final   Influenza B by PCR 04/11/2022 NEGATIVE  NEGATIVE Final   Comment: (NOTE) The Xpert Xpress SARS-CoV-2/FLU/RSV plus assay is intended as an aid in the diagnosis of influenza from Nasopharyngeal swab specimens and should not be used as a sole basis for treatment. Nasal washings and aspirates are unacceptable for Xpert Xpress SARS-CoV-2/FLU/RSV testing.  Fact Sheet for Patients: EntrepreneurPulse.com.au  Fact Sheet for Healthcare Providers: IncredibleEmployment.be  This test is not yet approved or cleared by the [place] FDA and has been authorized for detection and/or diagnosis of SARS-CoV-2 by FDA under an Emergency Use Authorization (EUA). This EUA will remain in effect (meaning this test can be used) for the duration of the COVID-19 declaration under Section 564(b)(1) of the Act, 21 U.S.C. section 360bbb-3(b)(1), unless the authorization is terminated or revoked.     Resp Syncytial Virus by PCR 04/11/2022 NEGATIVE  NEGATIVE Final   Comment: (NOTE) Fact Sheet for Patients: EntrepreneurPulse.com.au  Fact Sheet for Healthcare Providers: IncredibleEmployment.be  This test is not yet approved or cleared by the [place] FDA and has been authorized for detection and/or diagnosis of SARS-CoV-2 by FDA under an Emergency Use Authorization (EUA). This EUA will remain in effect (meaning this test can be used) for the duration of the COVID-19 declaration under Section 564(b)(1) of the Act, 21 U.S.C. section 360bbb-3(b)(1), unless the authorization is terminated or revoked.  Performed at [facility], [place] [address], [place], [place] [address]    Preg Test, Ur 04/11/2022 Negative  Negative Final   POC Amphetamine UR 04/11/2022 None Detected  NONE DETECTED (Cut Off Level 1000 ng/mL) Final   POC Secobarbital (BAR) 04/11/2022 None Detected  NONE DETECTED (Cut Off Level 300 ng/mL) Final   POC Buprenorphine (BUP) 04/11/2022 None Detected  NONE DETECTED (Cut Off Level 10 ng/mL) Final   POC Oxazepam (BZO) 04/11/2022 None Detected  NONE DETECTED (Cut Off Level 300 ng/mL) Final   POC Cocaine UR 04/11/2022 None Detected  NONE DETECTED (Cut Off Level 300 ng/mL) Final   POC Methamphetamine UR 04/11/2022 None Detected  NONE DETECTED (Cut Off Level 1000 ng/mL) Final   POC Morphine 04/11/2022 None Detected  NONE DETECTED (Cut Off Level 300 ng/mL) Final   POC Methadone UR 04/11/2022 None Detected  NONE DETECTED (Cut Off Level 300 ng/mL) Final   POC Oxycodone UR 04/11/2022 None Detected  NONE DETECTED (Cut Off Level 100 ng/mL) Final   POC Marijuana UR 04/11/2022 None Detected  NONE DETECTED (Cut Off Level 50 ng/mL) Final   SARSCOV2ONAVIRUS 2 AG 04/11/2022 NEGATIVE  NEGATIVE Final   Comment: (NOTE) SARS-CoV-2 antigen NOT DETECTED.   Negative results are presumptive.  Negative results do not preclude SARS-CoV-2 infection and should not be used as the sole basis for treatment or other patient management decisions, including infection  control decisions, particularly in the presence of  clinical signs and  symptoms consistent with COVID-19, or in those who have been in contact with the virus.  Negative results must be combined with clinical observations, patient history, and epidemiological information. The expected result is Negative.  Fact Sheet for Patients: HandmadeRecipes.com.cy  Fact Sheet for Healthcare Providers: FuneralLife.at  This test is not yet approved or cleared by the [place] FDA and  has been authorized for detection  and/or diagnosis of SARS-CoV-2 by FDA under an Emergency Use Authorization (EUA).  This EUA will remain in effect (meaning this test can be used) for the duration of  the COV                          ID-19 declaration under Section 564(b)(1) of the Act, 21 U.S.C. section 360bbb-3(b)(1), unless the authorization is terminated or revoked sooner.    Admission on 03/22/2022, Discharged on 03/23/2022  Component Date Value Ref Range Status   Sodium 03/22/2022 137  135 - 145 mmol/L Final   Potassium 03/22/2022 3.7  3.5 - 5.1 mmol/L Final   Chloride 03/22/2022 106  98 - 111 mmol/L Final   CO2 03/22/2022 20 (L)  22 - 32 mmol/L Final   Glucose, Bld 03/22/2022 124 (H)  70 - 99 mg/dL Final   Glucose reference range applies only to samples taken after fasting for at least 8 hours.   BUN 03/22/2022 9  6 - 20 mg/dL Final   Creatinine, Ser 03/22/2022 0.86  0.44 - 1.00 mg/dL Final   Calcium 03/22/2022 8.9  8.9 - 10.3 mg/dL Final   Total Protein 03/22/2022 6.6  6.5 - 8.1 g/dL Final   Albumin 03/22/2022 3.7  3.5 - 5.0 g/dL Final   AST 03/22/2022 23  15 - 41 U/L Final   ALT 03/22/2022 17  0 - 44 U/L Final   Alkaline Phosphatase 03/22/2022 50  38 - 126 U/L Final   Total Bilirubin 03/22/2022 0.8  0.3 - 1.2 mg/dL Final   GFR, Estimated 03/22/2022 >60  >60 mL/min Final   Comment: (NOTE) Calculated using the CKD-EPI Creatinine Equation (2021)    Anion gap 03/22/2022 11  5 - 15 Final   Performed at [facility], [place] [address], [place], [place] [address]   Alcohol, Ethyl (B) 03/22/2022 <10  <10 mg/dL Final   Comment: (NOTE) Lowest detectable limit for serum alcohol is 10 mg/dL.  For medical purposes only. Performed at [facility], [place] [address], [place], [place] [address]    Opiates 03/22/2022 NONE DETECTED  NONE DETECTED Final   Cocaine 03/22/2022 NONE DETECTED  NONE DETECTED Final   Benzodiazepines 03/22/2022 NONE DETECTED  NONE DETECTED Final   Amphetamines  03/22/2022 NONE DETECTED  NONE DETECTED Final   Tetrahydrocannabinol 03/22/2022 NONE DETECTED  NONE DETECTED Final   Barbiturates 03/22/2022 NONE DETECTED  NONE DETECTED Final   Comment: (NOTE) DRUG SCREEN FOR MEDICAL PURPOSES ONLY.  IF CONFIRMATION IS NEEDED FOR ANY PURPOSE, NOTIFY LAB WITHIN 5 DAYS.  LOWEST DETECTABLE LIMITS FOR URINE DRUG SCREEN Drug Class                     Cutoff (ng/mL) Amphetamine and metabolites    1000 Barbiturate and metabolites    200 Benzodiazepine                 200 Opiates and metabolites        300 Cocaine and metabolites  300 THC                            50 Performed at [facility], [place] [address], [place], [place] [address]    WBC 03/22/2022 8.0  4.0 - 10.5 K/uL Final   RBC 03/22/2022 4.44  3.87 - 5.11 MIL/uL Final   Hemoglobin 03/22/2022 11.2 (L)  12.0 - 15.0 g/dL Final   HCT 03/22/2022 36.0  36.0 - 46.0 % Final   MCV 03/22/2022 81.1  80.0 - 100.0 fL Final   MCH 03/22/2022 25.2 (L)  26.0 - 34.0 pg Final   MCHC 03/22/2022 31.1  30.0 - 36.0 g/dL Final   RDW 03/22/2022 14.6  11.5 - 15.5 % Final   Platelets 03/22/2022 234  150 - 400 K/uL Final   nRBC 03/22/2022 0.0  0.0 - 0.2 % Final   Neutrophils Relative % 03/22/2022 61  % Final   Neutro Abs 03/22/2022 4.8  1.7 - 7.7 K/uL Final   Lymphocytes Relative 03/22/2022 35  % Final   Lymphs Abs 03/22/2022 2.8  0.7 - 4.0 K/uL Final   Monocytes Relative 03/22/2022 4  % Final   Monocytes Absolute 03/22/2022 0.4  0.1 - 1.0 K/uL Final   Eosinophils Relative 03/22/2022 0  % Final   Eosinophils Absolute 03/22/2022 0.0  0.0 - 0.5 K/uL Final   Basophils Relative 03/22/2022 0  % Final   Basophils Absolute 03/22/2022 0.0  0.0 - 0.1 K/uL Final   Immature Granulocytes 03/22/2022 0  % Final   Abs Immature Granulocytes 03/22/2022 0.02  0.00 - 0.07 K/uL Final   Performed at [facility], [place] [address] [place], [place] [address]   SARS Coronavirus 2 by RT PCR  03/22/2022 NEGATIVE  NEGATIVE Final   Comment: (NOTE) SARS-CoV-2 target nucleic acids are NOT DETECTED.  The SARS-CoV-2 RNA is generally detectable in upper respiratory specimens during the acute phase of infection. The lowest concentration of SARS-CoV-2 viral copies this assay can detect is 138 copies/mL. A negative result does not preclude SARS-Cov-2 infection and should not be used as the sole basis for treatment or other patient management decisions. A negative result may occur with  improper specimen collection/handling, submission of specimen other than nasopharyngeal swab, presence of viral mutation(s) within the areas targeted by this assay, and inadequate number of viral copies(<138 copies/mL). A negative result must be combined with clinical observations, patient history, and epidemiological information. The expected result is Negative.  Fact Sheet for Patients:  EntrepreneurPulse.com.au  Fact Sheet for Healthcare Providers:  IncredibleEmployment.be  This test is no                          t yet approved or cleared by the [place] FDA and  has been authorized for detection and/or diagnosis of SARS-CoV-2 by FDA under an Emergency Use Authorization (EUA). This EUA will remain  in effect (meaning this test can be used) for the duration of the COVID-19 declaration under Section 564(b)(1) of the Act, 21 U.S.C.section 360bbb-3(b)(1), unless the authorization is terminated  or revoked sooner.       Influenza A by PCR 03/22/2022 NEGATIVE  NEGATIVE Final   Influenza B by PCR 03/22/2022 NEGATIVE  NEGATIVE Final   Comment: (NOTE) The Xpert Xpress SARS-CoV-2/FLU/RSV plus assay is intended as an aid in the diagnosis of influenza from Nasopharyngeal swab specimens and should not be used  as a sole basis for treatment. Nasal washings and aspirates are unacceptable for Xpert Xpress SARS-CoV-2/FLU/RSV testing.  Fact Sheet for  Patients: EntrepreneurPulse.com.au  Fact Sheet for Healthcare Providers: IncredibleEmployment.be  This test is not yet approved or cleared by the [place] FDA and has been authorized for detection and/or diagnosis of SARS-CoV-2 by FDA under an Emergency Use Authorization (EUA). This EUA will remain in effect (meaning this test can be used) for the duration of the COVID-19 declaration under Section 564(b)(1) of the Act, 21 U.S.C. section 360bbb-3(b)(1), unless the authorization is terminated or revoked.     Resp Syncytial Virus by PCR 03/22/2022 NEGATIVE  NEGATIVE Final   Comment: (NOTE) Fact Sheet for Patients: EntrepreneurPulse.com.au  Fact Sheet for Healthcare Providers: IncredibleEmployment.be  This test is not yet approved or cleared by the [place] FDA and has been authorized for detection and/or diagnosis of SARS-CoV-2 by FDA under an Emergency Use Authorization (EUA). This EUA will remain in effect (meaning this test can be used) for the duration of the COVID-19 declaration under Section 564(b)(1) of the Act, 21 U.S.C. section 360bbb-3(b)(1), unless the authorization is terminated or revoked.  Performed at [facility], [place] [address] [place], [place] [address]   Admission on 03/10/2022, Discharged on 03/11/2022  Component Date Value Ref Range Status   SARSCOV2ONAVIRUS 2 AG 03/10/2022 NEGATIVE  NEGATIVE Final   Comment: (NOTE) SARS-CoV-2 antigen NOT DETECTED.   Negative results are presumptive.  Negative results do not preclude SARS-CoV-2 infection and should not be used as the sole basis for treatment or other patient management decisions, including infection  control decisions, particularly in the presence of clinical signs and  symptoms consistent with COVID-19, or in those who have been in contact with the virus.  Negative results must be combined with clinical  observations, patient history, and epidemiological information. The expected result is Negative.  Fact Sheet for Patients: HandmadeRecipes.com.cy  Fact Sheet for Healthcare Providers: FuneralLife.at  This test is not yet approved or cleared by the [place] FDA and  has been authorized for detection and/or diagnosis of SARS-CoV-2 by FDA under an Emergency Use Authorization (EUA).  This EUA will remain in effect (meaning this test can be used) for the duration of  the COV                          ID-19 declaration under Section 564(b)(1) of the Act, 21 U.S.C. section 360bbb-3(b)(1), unless the authorization is terminated or revoked sooner.     SARS Coronavirus 2 by RT PCR 03/10/2022 NEGATIVE  NEGATIVE Final   Influenza A by PCR 03/10/2022 NEGATIVE  NEGATIVE Final   Influenza B by PCR 03/10/2022 NEGATIVE  NEGATIVE Final   Comment: (NOTE) The Xpert Xpress SARS-CoV-2/FLU/RSV plus assay is intended as an aid in the diagnosis of influenza from Nasopharyngeal swab specimens and should not be used as a sole basis for treatment. Nasal washings and aspirates are unacceptable for Xpert Xpress SARS-CoV-2/FLU/RSV testing.  Fact Sheet for Patients: EntrepreneurPulse.com.au  Fact Sheet for Healthcare Providers: IncredibleEmployment.be  This test is not yet approved or cleared by the [place] FDA and has been authorized for detection and/or diagnosis of SARS-CoV-2 by FDA under an Emergency Use Authorization (EUA). This EUA will remain in effect (meaning this test can be used) for the duration of the COVID-19 declaration under Section 564(b)(1) of the Act, 21 U.S.C. section 360bbb-3(b)(1), unless the authorization is terminated or revoked.  Resp Syncytial Virus by PCR 03/10/2022 NEGATIVE  NEGATIVE Final   Comment: (NOTE) Fact Sheet for Patients: EntrepreneurPulse.com.au  Fact  Sheet for Healthcare Providers: IncredibleEmployment.be  This test is not yet approved or cleared by the [place] FDA and has been authorized for detection and/or diagnosis of SARS-CoV-2 by FDA under an Emergency Use Authorization (EUA). This EUA will remain in effect (meaning this test can be used) for the duration of the COVID-19 declaration under Section 564(b)(1) of the Act, 21 U.S.C. section 360bbb-3(b)(1), unless the authorization is terminated or revoked.  Performed at [facility], [place] [address] [place], [place] [address]    WBC 03/10/2022 6.8  4.0 - 10.5 K/uL Final   RBC 03/10/2022 5.04  3.87 - 5.11 MIL/uL Final   Hemoglobin 03/10/2022 12.6  12.0 - 15.0 g/dL Final   HCT 03/10/2022 39.6  36.0 - 46.0 % Final   MCV 03/10/2022 78.6 (L)  80.0 - 100.0 fL Final   MCH 03/10/2022 25.0 (L)  26.0 - 34.0 pg Final   MCHC 03/10/2022 31.8  30.0 - 36.0 g/dL Final   RDW 03/10/2022 13.9  11.5 - 15.5 % Final   Platelets 03/10/2022 338  150 - 400 K/uL Final   nRBC 03/10/2022 0.0  0.0 - 0.2 % Final   Neutrophils Relative % 03/10/2022 52  % Final   Neutro Abs 03/10/2022 3.5  1.7 - 7.7 K/uL Final   Lymphocytes Relative 03/10/2022 42  % Final   Lymphs Abs 03/10/2022 2.8  0.7 - 4.0 K/uL Final   Monocytes Relative 03/10/2022 5  % Final   Monocytes Absolute 03/10/2022 0.4  0.1 - 1.0 K/uL Final   Eosinophils Relative 03/10/2022 1  % Final   Eosinophils Absolute 03/10/2022 0.0  0.0 - 0.5 K/uL Final   Basophils Relative 03/10/2022 0  % Final   Basophils Absolute 03/10/2022 0.0  0.0 - 0.1 K/uL Final   Immature Granulocytes 03/10/2022 0  % Final   Abs Immature Granulocytes 03/10/2022 0.02  0.00 - 0.07 K/uL Final   Performed at [facility], [place] [address], [place], [place] [address]   Sodium 03/10/2022 137  135 - 145 mmol/L Final   Potassium 03/10/2022 3.7  3.5 - 5.1 mmol/L Final   Chloride 03/10/2022 105  98 - 111 mmol/L Final   CO2 03/10/2022 25  22 - 32 mmol/L  Final   Glucose, Bld 03/10/2022 80  70 - 99 mg/dL Final   Glucose reference range applies only to samples taken after fasting for at least 8 hours.   BUN 03/10/2022 7  6 - 20 mg/dL Final   Creatinine, Ser 03/10/2022 0.72  0.44 - 1.00 mg/dL Final   Calcium 03/10/2022 9.2  8.9 - 10.3 mg/dL Final   Total Protein 03/10/2022 7.4  6.5 - 8.1 g/dL Final   Albumin 03/10/2022 4.3  3.5 - 5.0 g/dL Final   AST 03/10/2022 22  15 - 41 U/L Final   ALT 03/10/2022 17  0 - 44 U/L Final   Alkaline Phosphatase 03/10/2022 60  38 - 126 U/L Final   Total Bilirubin 03/10/2022 0.3  0.3 - 1.2 mg/dL Final   GFR, Estimated 03/10/2022 >60  >60 mL/min Final   Comment: (NOTE) Calculated using the CKD-EPI Creatinine Equation (2021)    Anion gap 03/10/2022 7  5 - 15 Final   Performed at [place] [address], [place], [place] [address]   Alcohol, Ethyl (B) 03/10/2022 <10  <10 mg/dL  Final   Comment: (NOTE) Lowest detectable limit for serum alcohol is 10 mg/dL.  For medical purposes only. Performed at [facility], [place] [address] [place], [place] [address]    Preg Test, Ur 03/10/2022 NEGATIVE  NEGATIVE Final   Comment:        THE SENSITIVITY OF THIS METHODOLOGY IS >24 mIU/mL   Admission on 03/03/2022, Discharged on 03/03/2022  Component Date Value Ref Range Status   Color, Urine 03/03/2022 YELLOW  YELLOW Final   APPearance 03/03/2022 HAZY (A)  CLEAR Final   Specific Gravity, Urine 03/03/2022 1.017  1.005 - 1.030 Final   pH 03/03/2022 5.0  5.0 - 8.0 Final   Glucose, UA 03/03/2022 NEGATIVE  NEGATIVE mg/dL Final   Hgb urine dipstick 03/03/2022 NEGATIVE  NEGATIVE Final   Bilirubin Urine 03/03/2022 NEGATIVE  NEGATIVE Final   Ketones, ur 03/03/2022 5 (A)  NEGATIVE mg/dL Final   Protein, ur 03/03/2022 NEGATIVE  NEGATIVE mg/dL Final   Nitrite 03/03/2022 NEGATIVE  NEGATIVE Final   Leukocytes,Ua 03/03/2022 NEGATIVE  NEGATIVE Final   Performed at [facility], [place] [address] [place],  [place] [address]  There may be more visits with results that are not included.    Allergies: Bee venom, Coconut flavor, Fish allergy, Geodon [ziprasidone hcl], Haloperidol and related, Lithobid [lithium], Roxicodone [oxycodone], Seroquel [quetiapine], Shellfish allergy, Phenergan [promethazine hcl], Prilosec [omeprazole], Sulfa antibiotics, Tegretol [carbamazepine], Prozac [fluoxetine], Tape, and Tylenol [acetaminophen]  Medications:  Facility Ordered Medications  Medication   [COMPLETED] hydrOXYzine (ATARAX) tablet 25 mg   alum & mag hydroxide-simeth (MAALOX/MYLANTA) 200-200-20 MG/5ML suspension 30 mL   magnesium hydroxide (MILK OF MAGNESIA) suspension 30 mL   hydrOXYzine (ATARAX) tablet 25 mg   traZODone (DESYREL) tablet 50 mg   bacitracin ointment 1 Application   pregabalin (LYRICA) capsule 100 mg   prazosin (MINIPRESS) capsule 1 mg   melatonin tablet 3 mg   OLANZapine zydis (ZYPREXA) disintegrating tablet 5 mg   fluticasone furoate-vilanterol (BREO ELLIPTA) 100-25 MCG/ACT 1 puff   PTA Medications  Medication Sig   albuterol (VENTOLIN HFA) 108 (90 Base) MCG/ACT inhaler Inhale 2 puffs into the lungs every 6 (six) hours as needed for wheezing or shortness of breath.   prazosin (MINIPRESS) 1 MG capsule Take 1 capsule (1 mg total) by mouth at bedtime for 14 days.   escitalopram (LEXAPRO) 10 MG tablet Take 1 tablet (10 mg total) by mouth daily for 14 days.   pantoprazole (PROTONIX) 40 MG tablet Take 1 tablet (40 mg total) by mouth daily for 14 days.   pregabalin (LYRICA) 100 MG capsule Take 100 mg by mouth 3 (three) times daily.   BREYNA 160-4.5 MCG/ACT inhaler Inhale 1 puff into the lungs daily.   diclofenac Sodium (VOLTAREN) 1 % GEL Apply 1 Application topically 4 (four) times daily.   metoprolol succinate (TOPROL-XL) 25 MG 24 hr tablet Take 1 tablet (25 mg total) by mouth daily for 7 days.   hydrOXYzine (ATARAX) 25 MG tablet Take 25 mg twice daily by mouth as needed for anxiety. Take 100 mg  by mouth for sleep as needed at bedtime. (Patient taking differently: Take 25-100 mg by mouth See admin instructions. Take one tablet daily as needed for anxiety and four tablets at bedtime as needed for sleep.)   OLANZapine (ZYPREXA) 5 MG tablet Take 1 tablet (5 mg total) by mouth daily as needed for up to 7 days (anxiety). (Patient taking differently: Take 5 mg by mouth at bedtime.)  traZODone (DESYREL) 50 MG tablet Take 1 tablet (50 mg total) by mouth at bedtime as needed for up to 7 days for sleep. (Patient taking differently: Take 50 mg by mouth at bedtime.)   cariprazine 4.5 MG CAPS Take 1 capsule (4.5 mg total) by mouth daily for 7 days.   Cyanocobalamin (VITAMIN B-12 PO) Take 1 capsule by mouth at bedtime.   Melatonin 3 MG CAPS Take 3 mg by mouth at bedtime.   ibuprofen (ADVIL) 200 MG tablet Take 200 mg by mouth every 6 (six) hours as needed for moderate pain.    Medical Decision Making  [place] admitted to continuous assessment unit for overnight observation.  Held Struble. Starting Olanzapine 5 mg BID for psychotic symptoms of hallucinations and SI. Re-evaluate tomorrow by psychiatry to determine appropriate level of care vs discharge to follow-up with ACT team. Recommendations  Based on my evaluation the patient does not appear to have an emergency medical condition.  Molli Barrows, NP 05/06/22  12:56 PM

## 2022-05-06 NOTE — ED Triage Notes (Signed)
Pt arrived by [facility] for continued SI, was seen here 3/28 sent BHU and was d/c not even 24 hours ago, pt advised her suicidal ideation did not resolved and would like to be readmitted for same. Pt took a pair of scissors to her left wrist, superficial abrasion noted. NAD noted VSS.

## 2022-05-06 NOTE — ED Notes (Signed)
TTS at bedside. 

## 2022-05-06 NOTE — ED Provider Notes (Signed)
Emergency Medicine Observation Re-evaluation Note  [name] is a 32 y.o. female, seen on rounds today.  Pt initially presented to the ED for complaints of Suicidal Currently, the patient is resting.  Physical Exam  BP (!) 140/80 (BP Location: Right Arm)   Pulse 73 Comment: Simultaneous filing. User may not have seen previous data.  Temp 98.2 F (36.8 C) (Oral) Comment: Simultaneous filing. User may not have seen previous data.  Resp 16 Comment: Simultaneous filing. User may not have seen previous data.  Ht 5\' 5"  (1.651 m)   Wt 86.2 kg   LMP 04/25/2022 (Exact Date)   SpO2 98%   BMI 31.62 kg/m  Physical Exam General: NAD Psych: No agitation  ED Course / MDM  EKG:   I have reviewed the labs performed to date as well as medications administered while in observation.  Recent changes in the last 24 hours include Psychiatry evaluated the patient, will go to [facility].  Plan  Current plan is for continued observation at [facility].    Regan Lemming, MD 05/06/22 1159

## 2022-05-06 NOTE — Consult Note (Signed)
Telepsych Consultation   Reason for Consult:   Referring Physician:   Location of Patient: [name]  Location of Provider: Other: [facility]  Patient Identification: [name] MRN:  [id number] Principal Diagnosis: Borderline personality disorder Diagnosis:  Principal Problem:   Borderline personality disorder ([place]) Active Problems:   Suicidal ideation   Self-injurious behavior  Total Time spent with patient: 30 minutes  Subjective:   [name] is a 32 y.o. female patient, voluntarily, with history of borderline personality disorder, chronic suicidal ideations, schizoaffective disorder, patient is well known to [employer] and has history of recurrent ED admissions and  was only discharged 24 hours from [facility].   HPI:   [name] 32 y.o., female evaluated via video-telehealth for psychiatric evaluation after presenting to the ED with AH reporting voices were telling her to kill herself and her parents. On evaluation today, patient reports continued suicidal ideations and not knowing why she is having the thoughts so frequently. Reports thinking her medications need adjustment, however has not discussed with her outpatient psychiatric provider. Patient denies a specific plan however, endorses hearing voices telling her to kill herself and parents.   During evaluation Aasha Quillen is sitting upright in no acute distress. She is alert, oriented x 4, calm, cooperative and attentive. Her mood is euthymic with congruent affect.  She has normal speech, and behavior.  Objectively there is no evidence of psychosis/mania or delusional thinking.  Patient is able to converse coherently, goal directed thoughts, no distractibility, or pre-occupation.  Patient does not appear to be responding to internal stimuli. [name] also denies suicidal/self-harm/homicidal ideation,  psychosis, and paranoia.  Patient answered question appropriately.     Consulted with with Dr. Dwyane Dee; and chart reviewed on 05/06/22, who agrees with plan for patient to transfer here to [facility].   Prior Outpatient Therapy:  [facility] and followed by ACTT team   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 fragile X syndrome    Chronic constipation    Depression    Drug-seeking behavior    Essential tremor    Headache    Overdose of acetaminophen 07/2017   and other meds   Personality disorder ([place])    Schizo-affective psychosis ([place])    Schizoaffective disorder, bipolar type ([place])    Seizures ([place])    Last seizure December 2017   Sleep apnea     Past Surgical History:  Procedure Laterality Date   MOUTH SURGERY  2009 or 2010   Family History:  Family History  Problem Relation Age of Onset   Mental illness Father    Asthma Father    PDD Brother    Seizures Brother    Social History:  Social History   Substance and Sexual Activity  Alcohol Use No   Alcohol/week: 1.0 standard drink of alcohol   Types: 1 Standard drinks or equivalent per week   Comment: denies at this time     Social History   Substance and Sexual Activity  Drug Use No   Comment: History of cocaine use at age 25 for 4 months    Social History   Socioeconomic History   Marital status: Widowed    Spouse name: Not on file   Number  of children: 0   Years of education: Not on file   Highest education level: Not on file  Occupational History   Occupation: disability  Tobacco Use   Smoking status: Former    Packs/day: 0    Types: Cigarettes   Smokeless tobacco: Never   Tobacco comments:    Smoked for 2  years age [age]  Vaping Use   Vaping Use: Never used  Substance and Sexual Activity   Alcohol use: No    Alcohol/week: 1.0 standard drink of alcohol    Types:  1 Standard drinks or equivalent per week    Comment: denies at this time   Drug use: No    Comment: History of cocaine use at age 85 for 4 months   Sexual activity: Not Currently    Birth control/protection: None  Other Topics Concern   Not on file  Social History Narrative   Marital status: Widowed      Children: daughter      Lives: with boyfriend, in two story home      Employment:  Disability      Tobacco: quit smoking; smoked for two years.      Alcohol ;none      Drugs: none   Has not traveled outside of the country.   Right handed         Social Determinants of Health   Financial Resource Strain: Not on file  Food Insecurity: No Food Insecurity (04/19/2022)   Hunger Vital Sign    Worried About Running Out of Food in the Last Year: Never true    Ran Out of Food in the Last Year: Never true  Transportation Needs: Unmet Transportation Needs (04/19/2022)   PRAPARE - Hydrologist (Medical): Yes    Lack of Transportation (Non-Medical): Yes  Physical Activity: Not on file  Stress: Not on file  Social Connections: Not on file   Additional Social History:    Allergies:   Allergies  Allergen Reactions   Bee Venom Anaphylaxis   Coconut Flavor Anaphylaxis and Rash   Fish Allergy Anaphylaxis   Geodon [Ziprasidone Hcl] Other (See Comments)    Pt states that this medication causes paralysis of the mouth.     Haloperidol And Related Other (See Comments)    Pt states that this medication causes paralysis of the mouth, jaw locks up   Lithobid [Lithium] Other (See Comments)    Seizure-like activity    Roxicodone [Oxycodone] Other (See Comments)    Hallucinations    Seroquel [Quetiapine] Other (See Comments)    Severe drowsiness Pt currently taking 12.5 mg BID, she is ok with this dose   Shellfish Allergy Anaphylaxis   Phenergan [Promethazine Hcl] Other (See Comments)    Chest pain     Prilosec [Omeprazole] Nausea And Vomiting    Pt can take  protonix with no problems    Sulfa Antibiotics Other (See Comments)    Chest pain    Tegretol [Carbamazepine] Nausea And Vomiting   Prozac [Fluoxetine] Other (See Comments)    Increased Depression Suicidal thoughts   Tape Other (See Comments)    Skin tears, can only tolerate paper tape.   Tylenol [Acetaminophen] Rash and Other (See Comments)    Rash on face     Labs:  Results for orders placed or performed during the hospital encounter of 05/06/22 (from the past 48 hour(s))  Urine rapid drug screen (hosp performed)     Status: None  Collection Time: 05/06/22 12:44 AM  Result Value Ref Range   Opiates NONE DETECTED NONE DETECTED   Cocaine NONE DETECTED NONE DETECTED   Benzodiazepines NONE DETECTED NONE DETECTED   Amphetamines NONE DETECTED NONE DETECTED   Tetrahydrocannabinol NONE DETECTED NONE DETECTED   Barbiturates NONE DETECTED NONE DETECTED    Comment: (NOTE) DRUG SCREEN FOR MEDICAL PURPOSES ONLY.  IF CONFIRMATION IS NEEDED FOR ANY PURPOSE, NOTIFY LAB WITHIN 5 DAYS.  LOWEST DETECTABLE LIMITS FOR URINE DRUG SCREEN Drug Class                     Cutoff (ng/mL) Amphetamine and metabolites    1000 Barbiturate and metabolites    200 Benzodiazepine                 200 Opiates and metabolites        300 Cocaine and metabolites        300 THC                            50 Performed at [facility], [place] [address] [place], [place] [address]   Comprehensive metabolic panel     Status: Abnormal   Collection Time: 05/06/22  2:57 AM  Result Value Ref Range   Sodium 136 135 - 145 mmol/L   Potassium 3.7 3.5 - 5.1 mmol/L   Chloride 104 98 - 111 mmol/L   CO2 22 22 - 32 mmol/L   Glucose, Bld 102 (H) 70 - 99 mg/dL    Comment: Glucose reference range applies only to samples taken after fasting for at least 8 hours.   BUN 14 6 - 20 mg/dL   Creatinine, Ser 0.66 0.44 - 1.00 mg/dL   Calcium 9.2 8.9 - 10.3 mg/dL   Total Protein 8.1 6.5 - 8.1 g/dL   Albumin 4.6  3.5 - 5.0 g/dL   AST 17 15 - 41 U/L   ALT 20 0 - 44 U/L   Alkaline Phosphatase 69 38 - 126 U/L   Total Bilirubin 0.6 0.3 - 1.2 mg/dL   GFR, Estimated >60 >60 mL/min    Comment: (NOTE) Calculated using the CKD-EPI Creatinine Equation (2021)    Anion gap 10 5 - 15    Comment: Performed at [facility], [place] [address], [place], [place] [address]  Ethanol     Status: None   Collection Time: 05/06/22  2:57 AM  Result Value Ref Range   Alcohol, Ethyl (B) <10 <10 mg/dL    Comment: (NOTE) Lowest detectable limit for serum alcohol is 10 mg/dL.  For medical purposes only. Performed at [facility], [place] [address], [place],  [address]   CBC with Diff     Status: Abnormal   Collection Time: 05/06/22  2:57 AM  Result Value Ref Range   WBC 10.0 4.0 - 10.5 K/uL   RBC 4.99 3.87 - 5.11 MIL/uL   Hemoglobin 12.6 12.0 - 15.0 g/dL   HCT 40.3 36.0 - 46.0 %   MCV 80.8 80.0 - 100.0 fL   MCH 25.3 (L) 26.0 - 34.0 pg   MCHC 31.3 30.0 - 36.0 g/dL   RDW 14.4 11.5 - 15.5 %   Platelets 307 150 - 400 K/uL   nRBC 0.0 0.0 - 0.2 %   Neutrophils Relative % 63 %   Neutro Abs 6.2 1.7 - 7.7 K/uL   Lymphocytes Relative 31 %  Lymphs Abs 3.1 0.7 - 4.0 K/uL   Monocytes Relative 5 %   Monocytes Absolute 0.5 0.1 - 1.0 K/uL   Eosinophils Relative 1 %   Eosinophils Absolute 0.1 0.0 - 0.5 K/uL   Basophils Relative 0 %   Basophils Absolute 0.0 0.0 - 0.1 K/uL   Immature Granulocytes 0 %   Abs Immature Granulocytes 0.02 0.00 - 0.07 K/uL    Comment: Performed at [facility], [place] [address] [place], [place] [address]  Pregnancy, urine     Status: None   Collection Time: 05/06/22  9:33 AM  Result Value Ref Range   Preg Test, Ur NEGATIVE NEGATIVE    Comment:        THE SENSITIVITY OF THIS METHODOLOGY IS >20 mIU/mL. Performed at [facility], [place] [address] [place], [place] [address]     Medications:  Current  Facility-Administered Medications  Medication Dose Route Frequency Provider Last Rate Last Admin   albuterol (VENTOLIN HFA) 108 (90 Base) MCG/ACT inhaler 2 puff  2 puff Inhalation Q6H PRN Regan Lemming, MD       fluticasone furoate-vilanterol (BREO ELLIPTA) 200-25 MCG/ACT 1 puff  1 puff Inhalation Daily Regan Lemming, MD       hydrOXYzine (ATARAX) tablet 25-100 mg  25-100 mg Oral BID PRN Regan Lemming, MD       melatonin tablet 3 mg  3 mg Oral QHS Regan Lemming, MD       metoprolol succinate (TOPROL-XL) 24 hr tablet 25 mg  25 mg Oral Daily Regan Lemming, MD   25 mg at 05/06/22 1119   OLANZapine (ZYPREXA) tablet 5 mg  5 mg Oral QHS Regan Lemming, MD       prazosin (MINIPRESS) capsule 1 mg  1 mg Oral QHS Regan Lemming, MD       pregabalin (LYRICA) capsule 100 mg  100 mg Oral TID Regan Lemming, MD   100 mg at 05/06/22 1120   traZODone (DESYREL) tablet 50 mg  50 mg Oral QHS Regan Lemming, MD       Current Outpatient Medications  Medication Sig Dispense Refill   albuterol (VENTOLIN HFA) 108 (90 Base) MCG/ACT inhaler Inhale 2 puffs into the lungs every 6 (six) hours as needed for wheezing or shortness of breath.     BREYNA 160-4.5 MCG/ACT inhaler Inhale 1 puff into the lungs daily.     cariprazine 4.5 MG CAPS Take 1 capsule (4.5 mg total) by mouth daily for 7 days. 7 capsule 0   Cyanocobalamin (VITAMIN B-12 PO) Take 1 capsule by mouth at bedtime.     diclofenac Sodium (VOLTAREN) 1 % GEL Apply 1 Application topically 4 (four) times daily.     escitalopram (LEXAPRO) 10 MG tablet Take 1 tablet (10 mg total) by mouth daily for 14 days. 14 tablet 0   hydrOXYzine (ATARAX) 25 MG tablet Take 25 mg twice daily by mouth as needed for anxiety. Take 100 mg by mouth for sleep as needed at bedtime. (Patient taking differently: Take 25-100 mg by mouth See admin instructions. Take one tablet daily as needed for anxiety and four tablets at bedtime as needed for sleep.) 7 tablet 0   ibuprofen (ADVIL) 200 MG  tablet Take 200 mg by mouth every 6 (six) hours as needed for moderate pain.     Melatonin 3 MG CAPS Take 3 mg by mouth at bedtime.     metoprolol succinate (TOPROL-XL) 25 MG 24 hr tablet Take 1 tablet (25 mg  total) by mouth daily for 7 days. 7 tablet 0   OLANZapine (ZYPREXA) 5 MG tablet Take 1 tablet (5 mg total) by mouth daily as needed for up to 7 days (anxiety). (Patient taking differently: Take 5 mg by mouth at bedtime.) 7 tablet 0   pantoprazole (PROTONIX) 40 MG tablet Take 1 tablet (40 mg total) by mouth daily for 14 days. 14 tablet 0   prazosin (MINIPRESS) 1 MG capsule Take 1 capsule (1 mg total) by mouth at bedtime for 14 days. 14 capsule 0   pregabalin (LYRICA) 100 MG capsule Take 100 mg by mouth 3 (three) times daily.     traZODone (DESYREL) 50 MG tablet Take 1 tablet (50 mg total) by mouth at bedtime as needed for up to 7 days for sleep. (Patient taking differently: Take 50 mg by mouth at bedtime.) 7 tablet 0    Musculoskeletal: Strength & Muscle Tone: within normal limits Gait & Station: normal Patient leans: N/A   Psychiatric Specialty Exam:  Presentation  General Appearance:  Appropriate for Environment  Eye Contact: Fair  Speech: Clear and Coherent  Speech Volume: Normal  Handedness: Right   Mood and Affect  Mood: Dysphoric  Affect: Congruent   Thought Process  Thought Processes: Coherent  Descriptions of Associations:Intact  Orientation:Full (Time, Place and Person)  Thought Content:Scattered  History of Schizophrenia/Schizoaffective disorder:Yes  Duration of Psychotic Symptoms:Greater than six months  Hallucinations:Hallucinations: Auditory  Ideas of Reference:None  Suicidal Thoughts:Suicidal Thoughts: Yes, Passive SI Active Intent and/or Plan: Without Intent SI Passive Intent and/or Plan: Without Intent  Homicidal Thoughts:Homicidal Thoughts: Yes, Passive HI Passive Intent and/or Plan: Without Intent   Sensorium   Memory: Immediate Good  Judgment: Poor  Insight: Lacking   Executive Functions  Concentration: Fair  Attention Span: Fair  Recall: [employer] of Knowledge: Fair  Language: Fair   Psychomotor Activity  Psychomotor Activity: Psychomotor Activity: Normal   Assets  Assets: Armed forces logistics/support/administrative officer; Housing; Physical Health   Sleep  Sleep: Sleep: Fair    Physical Exam: Physical Exam Constitutional:      Appearance: Normal appearance.  HENT:     Head: Normocephalic.     Nose: Nose normal.  Eyes:     Extraocular Movements: Extraocular movements intact.     Pupils: Pupils are equal, round, and reactive to light.  Cardiovascular:     Rate and Rhythm: Normal rate and regular rhythm.  Pulmonary:     Effort: Pulmonary effort is normal.     Breath sounds: Normal breath sounds.  Musculoskeletal:     Cervical back: Normal range of motion.  Neurological:     General: No focal deficit present.     Mental Status: She is alert.     Review of Systems  Psychiatric/Behavioral:  Positive for depression, hallucinations and suicidal ideas.    Blood pressure (!) 145/89, pulse (!) 101, temperature 98 F (36.7 C), temperature source Oral, resp. rate 18, height 5\' 5"  (1.651 m), weight 86.2 kg, last menstrual period 04/25/2022, SpO2 99 %. Body mass index is 31.62 kg/m.  Treatment Plan Summary: Patient passively suicidal without plan, given comorbid  Disposition: Patient does not meet criteria for psychiatric inpatient admission. [place] for 24 hour observation and continuous assessment .  This service was provided via telemedicine using a 2-way, interactive audio and video technology.  Names of all persons participating in this telemedicine service and their role in this encounter. Name: Demetrice Pardy  Role: Patient   Name: Scot Jun,  PMHNP-BC Role: Psychiatric Nurse Practitioner     Molli Barrows, FNP-C, PMHNP-BC  [facility]  Urgent [phone]  05/06/2022 11:36 AM

## 2022-05-06 NOTE — ED Notes (Signed)
Bacitracin ointment applied to left anterior and posterior wrist and forearm. Telfa dressing applied. Patient tolerated well.

## 2022-05-07 DIAGNOSIS — F603 Borderline personality disorder: Secondary | ICD-10-CM | POA: Diagnosis not present

## 2022-05-07 DIAGNOSIS — R443 Hallucinations, unspecified: Secondary | ICD-10-CM | POA: Diagnosis not present

## 2022-05-07 DIAGNOSIS — R45851 Suicidal ideations: Secondary | ICD-10-CM | POA: Diagnosis not present

## 2022-05-07 DIAGNOSIS — S60812A Abrasion of left wrist, initial encounter: Secondary | ICD-10-CM | POA: Diagnosis not present

## 2022-05-07 NOTE — ED Provider Notes (Signed)
FBC/OBS ASAP Discharge Summary  Date and Time: 05/07/2022 11:09 AM  Name: [name]  MRN:  [id number]   Discharge Diagnoses:  Final diagnoses:  Passive suicidal ideations  Hallucinations  Borderline personality disorder ([place])   HPI:   [name] is a 32 y.o. female patient, voluntarily, with history of borderline personality disorder, chronic suicidal ideations, schizoaffective disorder, patient is well known to [employer] and has history of recurrent ED admissions and was only discharged 24 hours from [facility]. Patient transferred here to [facility] from [facility] for continuous assessment.   [name], 32 y.o., female patient seen face to face by this provider, consulted with Dr. Dwyane Dee; and chart reviewed on 05/07/22 per chart review Merleen Milliner NP "[name] has history of over-utilization of emergency services due to recurrent behavioral health related complaints. [name] was recently hospitalized at  04/19/2022 and remained inpatient for period of 11 days. Despite inpatient stabilizations, [name] has continued to return frequently to the ED with a total of 25 encounters at the ED or here at [facility] within the last 6 months. [name] is established with [facility] for psychiatric services including an ACTT team and reports compliance with home psychiatric medication regimen."  Subjective:  On today's evaluation patient is observed sitting in her bed awake.  Brighid is alert/oriented x 4, cooperative, and attentive.  She makes good eye contact.  She has normal speech and behavior. She immediately requested to be discharged.  States, "I know what my problem was it is when I am at home alone I become suicidal, I am going to talk to my therapist today about it".  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  She is forward thinking.  She verbally contracts for safety.  She agrees that if her  condition were to worsen that she would call 911, [phone], or present to the nearest emergency room.  She denies any current suicidal ideations.  She denies HI/AVH.  She does not appear to be responding to internal/external stimuli.  She denies any depression at the current time but does endorse feeling a little anxious because she is ready to be discharged.    Stay Summary:   Patient remained calm and cooperative while on the unit.  She was appropriate with staff and other patients.  She was compliant with medications and required no as needed medications for agitation.  She agrees to follow-up with CST services and her therapist upon discharge.  Total Time spent with patient: 30 minutes  Past Psychiatric History: as documented in the h&P Past Medical History: as documented in the h&P Family History:  Family Psychas documented in the h&P iatric History: as documented in the h&P Social History: as documented in the h&P Tobacco Cessation:  N/A, patient does not currently use tobacco products  Current Medications:  Current Facility-Administered Medications  Medication Dose Route Frequency Provider Last Rate Last Admin   alum & mag hydroxide-simeth (MAALOX/MYLANTA) 200-200-20 MG/5ML suspension 30 mL  30 mL Oral Q4H PRN Scot Jun, NP       bacitracin ointment 1 Application  1 Application Topical BID Scot Jun, NP   1 Application at [id number]   hydrOXYzine (ATARAX) tablet 25 mg  25 mg Oral TID PRN Scot Jun, NP       ibuprofen (ADVIL) tablet 400 mg  400 mg Oral BID PRN Scot Jun, NP  magnesium hydroxide (MILK OF MAGNESIA) suspension 30 mL  30 mL Oral Daily PRN Scot Jun, NP       melatonin tablet 3 mg  3 mg Oral QHS Scot Jun, NP   3 mg at 05/06/22 2130   mometasone-formoterol (DULERA) 100-5 MCG/ACT inhaler 2 puff  2 puff Inhalation Daily Scot Jun, NP   2 puff at 05/06/22 1436   OLANZapine zydis (ZYPREXA) disintegrating tablet  5 mg  5 mg Oral BID Scot Jun, NP   5 mg at 05/06/22 2130   prazosin (MINIPRESS) capsule 1 mg  1 mg Oral QHS Scot Jun, NP   1 mg at 05/06/22 2130   pregabalin (LYRICA) capsule 100 mg  100 mg Oral TID Scot Jun, NP   100 mg at 05/06/22 2130   traZODone (DESYREL) tablet 50 mg  50 mg Oral QHS PRN Scot Jun, NP   50 mg at 05/06/22 2129   Current Outpatient Medications  Medication Sig Dispense Refill   albuterol (VENTOLIN HFA) 108 (90 Base) MCG/ACT inhaler Inhale 2 puffs into the lungs every 6 (six) hours as needed for wheezing or shortness of breath.     BREYNA 160-4.5 MCG/ACT inhaler Inhale 1 puff into the lungs daily.     cariprazine 4.5 MG CAPS Take 1 capsule (4.5 mg total) by mouth daily for 7 days. 7 capsule 0   Cyanocobalamin (VITAMIN B-12 PO) Take 1 capsule by mouth at bedtime.     diclofenac Sodium (VOLTAREN) 1 % GEL Apply 1 Application topically 4 (four) times daily.     escitalopram (LEXAPRO) 10 MG tablet Take 1 tablet (10 mg total) by mouth daily for 14 days. 14 tablet 0   hydrOXYzine (ATARAX) 25 MG tablet Take 25 mg twice daily by mouth as needed for anxiety. Take 100 mg by mouth for sleep as needed at bedtime. (Patient taking differently: Take 25-100 mg by mouth See admin instructions. Take one tablet daily as needed for anxiety and four tablets at bedtime as needed for sleep.) 7 tablet 0   ibuprofen (ADVIL) 200 MG tablet Take 200 mg by mouth every 6 (six) hours as needed for moderate pain.     Melatonin 3 MG CAPS Take 3 mg by mouth at bedtime.     metoprolol succinate (TOPROL-XL) 25 MG 24 hr tablet Take 1 tablet (25 mg total) by mouth daily for 7 days. 7 tablet 0   OLANZapine (ZYPREXA) 5 MG tablet Take 1 tablet (5 mg total) by mouth daily as needed for up to 7 days (anxiety). (Patient taking differently: Take 5 mg by mouth at bedtime.) 7 tablet 0   pantoprazole (PROTONIX) 40 MG tablet Take 1 tablet (40 mg total) by mouth daily for 14 days. 14 tablet 0    prazosin (MINIPRESS) 1 MG capsule Take 1 capsule (1 mg total) by mouth at bedtime for 14 days. 14 capsule 0   pregabalin (LYRICA) 100 MG capsule Take 100 mg by mouth 3 (three) times daily.     traZODone (DESYREL) 50 MG tablet Take 1 tablet (50 mg total) by mouth at bedtime as needed for up to 7 days for sleep. (Patient taking differently: Take 50 mg by mouth at bedtime.) 7 tablet 0    PTA Medications:  PTA Medications  Medication Sig   albuterol (VENTOLIN HFA) 108 (90 Base) MCG/ACT inhaler Inhale 2 puffs into the lungs every 6 (six) hours as needed for wheezing or shortness of breath.  prazosin (MINIPRESS) 1 MG capsule Take 1 capsule (1 mg total) by mouth at bedtime for 14 days.   escitalopram (LEXAPRO) 10 MG tablet Take 1 tablet (10 mg total) by mouth daily for 14 days.   pantoprazole (PROTONIX) 40 MG tablet Take 1 tablet (40 mg total) by mouth daily for 14 days.   pregabalin (LYRICA) 100 MG capsule Take 100 mg by mouth 3 (three) times daily.   BREYNA 160-4.5 MCG/ACT inhaler Inhale 1 puff into the lungs daily.   diclofenac Sodium (VOLTAREN) 1 % GEL Apply 1 Application topically 4 (four) times daily.   metoprolol succinate (TOPROL-XL) 25 MG 24 hr tablet Take 1 tablet (25 mg total) by mouth daily for 7 days.   hydrOXYzine (ATARAX) 25 MG tablet Take 25 mg twice daily by mouth as needed for anxiety. Take 100 mg by mouth for sleep as needed at bedtime. (Patient taking differently: Take 25-100 mg by mouth See admin instructions. Take one tablet daily as needed for anxiety and four tablets at bedtime as needed for sleep.)   OLANZapine (ZYPREXA) 5 MG tablet Take 1 tablet (5 mg total) by mouth daily as needed for up to 7 days (anxiety). (Patient taking differently: Take 5 mg by mouth at bedtime.)   traZODone (DESYREL) 50 MG tablet Take 1 tablet (50 mg total) by mouth at bedtime as needed for up to 7 days for sleep. (Patient taking differently: Take 50 mg by mouth at bedtime.)   cariprazine 4.5 MG CAPS  Take 1 capsule (4.5 mg total) by mouth daily for 7 days.   Cyanocobalamin (VITAMIN B-12 PO) Take 1 capsule by mouth at bedtime.   Melatonin 3 MG CAPS Take 3 mg by mouth at bedtime.   ibuprofen (ADVIL) 200 MG tablet Take 200 mg by mouth every 6 (six) hours as needed for moderate pain.   Facility Ordered Medications  Medication   [COMPLETED] hydrOXYzine (ATARAX) tablet 25 mg   alum & mag hydroxide-simeth (MAALOX/MYLANTA) 200-200-20 MG/5ML suspension 30 mL   magnesium hydroxide (MILK OF MAGNESIA) suspension 30 mL   hydrOXYzine (ATARAX) tablet 25 mg   traZODone (DESYREL) tablet 50 mg   bacitracin ointment 1 Application   pregabalin (LYRICA) capsule 100 mg   prazosin (MINIPRESS) capsule 1 mg   melatonin tablet 3 mg   OLANZapine zydis (ZYPREXA) disintegrating tablet 5 mg   ibuprofen (ADVIL) tablet 400 mg   mometasone-formoterol (DULERA) 100-5 MCG/ACT inhaler 2 puff       03/10/2022   10:01 PM 03/10/2022    9:56 PM 02/10/2022    1:49 AM  Depression screen PHQ 2/9  Decreased Interest 1 1 2   Down, Depressed, Hopeless 1 1 2   PHQ - 2 Score 2 2 4   Altered sleeping 1 1 2   Tired, decreased energy 1 1 2   Change in appetite 1 0 2  Feeling bad or failure about yourself  1 1 2   Trouble concentrating 1 1 2   Moving slowly or fidgety/restless 1 1 1   Suicidal thoughts 1 1 1   PHQ-9 Score 9 8 16   Difficult doing work/chores Somewhat difficult  Very difficult    Flowsheet Row ED from 05/06/2022 in [facility] Most recent reading at 05/06/2022 12:19 PM ED from 05/06/2022 in [facility] Emergency Department at [facility] Most recent reading at 05/06/2022 12:42 AM ED from 05/04/2022 in [facility] Most recent reading at 05/04/2022  3:08 PM  C-SSRS RISK CATEGORY High Risk High Risk High Risk  Musculoskeletal  Strength & Muscle Tone: within normal limits Gait & Station: normal Patient leans: N/A  Psychiatric Specialty Exam   Presentation  General Appearance:  Appropriate for Environment; Casual  Eye Contact: Good  Speech: Clear and Coherent; Normal Rate  Speech Volume: Normal  Handedness: Right   Mood and Affect  Mood: Anxious  Affect: Congruent   Thought Process  Thought Processes: Coherent  Descriptions of Associations:Intact  Orientation:Full (Time, Place and Person)  Thought Content:Logical  Diagnosis of Schizophrenia or Schizoaffective disorder in past: Yes  Duration of Psychotic Symptoms: Greater than six months   Hallucinations:Hallucinations: None  Ideas of Reference:None  Suicidal Thoughts:Suicidal Thoughts: No SI Active Intent and/or Plan: Without Intent SI Passive Intent and/or Plan: Without Intent  Homicidal Thoughts:Homicidal Thoughts: No HI Passive Intent and/or Plan: Without Intent   Sensorium  Memory: Immediate Good; Recent Good; Remote Good  Judgment: Poor  Insight: Fair   Community education officer  Concentration: Good  Attention Span: Good  Recall: Good  Fund of Knowledge: Good  Language: Good   Psychomotor Activity  Psychomotor Activity: Psychomotor Activity: Normal   Assets  Assets: Communication Skills; Desire for Improvement; Physical Health; Resilience; Social Support   Sleep  Sleep: Sleep: Fair   No data recorded  Physical Exam  Physical Exam Vitals and nursing note reviewed.  Constitutional:      General: She is not in acute distress.    Appearance: Normal appearance. She is well-developed. She is not ill-appearing.  HENT:     Head: Normocephalic and atraumatic.  Eyes:     General:        Right eye: No discharge.        Left eye: No discharge.     Conjunctiva/sclera: Conjunctivae normal.  Cardiovascular:     Rate and Rhythm: Normal rate and regular rhythm.     Heart sounds: No murmur heard. Pulmonary:     Effort: Pulmonary effort is normal. No respiratory distress.     Breath sounds: Normal breath sounds.   Abdominal:     Palpations: Abdomen is soft.     Tenderness: There is no abdominal tenderness.  Musculoskeletal:        General: No swelling. Normal range of motion.     Cervical back: Normal range of motion and neck supple.  Skin:    General: Skin is warm and dry.     Capillary Refill: Capillary refill takes less than 2 seconds.     Coloration: Skin is not jaundiced or pale.  Neurological:     Mental Status: She is alert and oriented to person, place, and time.  Psychiatric:        Attention and Perception: Attention and perception normal.        Mood and Affect: Affect normal. Mood is anxious.        Speech: Speech normal.        Behavior: Behavior normal. Behavior is cooperative.        Thought Content: Thought content normal.        Cognition and Memory: Cognition normal.        Judgment: Judgment is impulsive.    Review of Systems  Constitutional: Negative.   HENT: Negative.    Eyes: Negative.   Respiratory: Negative.    Cardiovascular: Negative.   Musculoskeletal: Negative.   Skin: Negative.   Neurological: Negative.   Psychiatric/Behavioral:  The patient is nervous/anxious.    Blood pressure (!) 100/55, pulse 80, temperature 98.4 F (36.9 C), temperature source Oral,  resp. rate 17, last menstrual period 04/25/2022, SpO2 98 %. There is no height or weight on file to calculate BMI.  Demographic Factors:  Adolescent or young adult, Living alone, and Unemployed  Loss Factors: NA  Historical Factors: Prior suicide attempts and Impulsivity  Risk Reduction Factors:   Positive social support, Positive therapeutic relationship, and Positive coping skills or problem solving skills  Continued Clinical Symptoms:  Depression:   Impulsivity Schizophrenia:   Paranoid or undifferentiated type More than one psychiatric diagnosis  Cognitive Features That Contribute To Risk:  None    Suicide Risk:  Minimal: No identifiable suicidal ideation.  Patients presenting with no  risk factors but with morbid ruminations; may be classified as minimal risk based on the severity of the depressive symptoms  Plan Of Care/Follow-up recommendations:  Activity:  as tolerated  Diet:  regular   Disposition:   Discharge patient  Patient agrees to contact her CST team today and her therapist, which she has an appointment with tomorrow.   Revonda Humphrey, NP 05/07/2022, 11:09 AM

## 2022-05-07 NOTE — ED Notes (Signed)
Discharged home with all belongings, patient verbalized she would get her own ride home, declined assistance with taxi cab. Denies SI/HI/AVH upon discharge. Verbalized safety plan and resources to utilize if she becomes suicidal, homicidal, or starts experiencing AVH.

## 2022-05-07 NOTE — Discharge Instructions (Signed)
The suicide prevention education provided includes the following: Suicide risk factors Suicide prevention and interventions National Suicide Hotline telephone number [facility] assessment telephone number [place] Emergency Assistance 911 County and/or Residential Mobile Crisis Unit telephone number   Remove weapons (e.g., guns, rifles, knives), all items previously/currently identified as safety concern.   Remove drugs/medications (over the counter, prescriptions, illicit drugs), all items previously/currently identified as a safety concern.  

## 2022-05-09 ENCOUNTER — Other Ambulatory Visit: Payer: Self-pay

## 2022-05-09 ENCOUNTER — Emergency Department (HOSPITAL_COMMUNITY)
Admission: EM | Admit: 2022-05-09 | Discharge: 2022-05-10 | Disposition: A | Discharge status: Home / Self Care | Payer: Medicare PPO | Attending: Emergency Medicine | Admitting: Emergency Medicine

## 2022-05-09 ENCOUNTER — Encounter (HOSPITAL_COMMUNITY): Payer: Self-pay

## 2022-05-09 DIAGNOSIS — F840 Autistic disorder: Secondary | ICD-10-CM | POA: Diagnosis not present

## 2022-05-09 DIAGNOSIS — R079 Chest pain, unspecified: Secondary | ICD-10-CM | POA: Diagnosis not present

## 2022-05-09 NOTE — ED Triage Notes (Signed)
Pt complaining of chest pain that started an hour ago. Center of chest and radiates to the left jaw and left arm. EMS gave 3 nitro and 4 baby Aspirin.   Said that the pain is down to a 3 on the pain scale.

## 2022-05-10 ENCOUNTER — Emergency Department (HOSPITAL_COMMUNITY): Payer: Medicare PPO

## 2022-05-10 DIAGNOSIS — R079 Chest pain, unspecified: Secondary | ICD-10-CM | POA: Diagnosis not present

## 2022-05-10 LAB — TROPONIN I (HIGH SENSITIVITY)
Troponin I (High Sensitivity): <2 ng/L (ref <18)
Troponin I (High Sensitivity): 2 ng/L (ref <18)

## 2022-05-10 LAB — CBC
HCT: 37.2 % (ref 36.0–46.0)
Hemoglobin: 11.5 g/dL — ABNORMAL LOW (ref 12.0–15.0)
MCH: 25.4 pg — ABNORMAL LOW (ref 26.0–34.0)
MCHC: 30.9 g/dL (ref 30.0–36.0)
MCV: 82.3 fL (ref 80.0–100.0)
Platelets: 283 10*3/uL (ref 150–400)
RBC: 4.52 MIL/uL (ref 3.87–5.11)
RDW: 14.5 % (ref 11.5–15.5)
WBC: 9.1 10*3/uL (ref 4.0–10.5)
nRBC: 0 % (ref 0.0–0.2)

## 2022-05-10 LAB — BASIC METABOLIC PANEL
Anion gap: 14 (ref 5–15)
BUN: 7 mg/dL (ref 6–20)
CO2: 20 mmol/L — ABNORMAL LOW (ref 22–32)
Calcium: 9 mg/dL (ref 8.9–10.3)
Chloride: 103 mmol/L (ref 98–111)
Creatinine, Ser: 0.67 mg/dL (ref 0.44–1.00)
GFR, Estimated: >60 mL/min (ref >60)
Glucose, Bld: 119 mg/dL — ABNORMAL HIGH (ref 70–99)
Potassium: 3.9 mmol/L (ref 3.5–5.1)
Sodium: 137 mmol/L (ref 135–145)

## 2022-05-10 MED ORDER — ONDANSETRON HCL 4 MG/2ML IJ SOLN
4.0000 mg | Freq: Once | INTRAMUSCULAR | Status: AC
  Administered 2022-05-10: 4 mg via INTRAVENOUS
  Filled 2022-05-10: qty 2

## 2022-05-10 MED ORDER — NITROGLYCERIN 0.4 MG SL SUBL
0.4000 mg | SUBLINGUAL_TABLET | SUBLINGUAL | Status: DC | PRN
  Administered 2022-05-10: 0.4 mg via SUBLINGUAL
  Filled 2022-05-10: qty 1

## 2022-05-10 MED ORDER — ASPIRIN 81 MG PO CHEW
324.0000 mg | CHEWABLE_TABLET | Freq: Once | ORAL | Status: AC
  Administered 2022-05-10: 324 mg via ORAL
  Filled 2022-05-10: qty 4

## 2022-05-10 NOTE — Discharge Instructions (Addendum)
You have been evaluated for your symptoms.  Fortunately no concerning findings were noted on today's exam.  You may call and follow-up closely with your cardiologist for outpatient evaluation of your symptoms.  Return if you have any concern.

## 2022-05-10 NOTE — ED Provider Notes (Signed)
[facility] Provider Note   CSN: [id number] Arrival date & time: 05/09/22  2352     History  No chief complaint on file.   [name] is a 32 y.o. Ward.  The history is provided by the patient and medical records. No language interpreter was used.     32 year old Ward significant history of autism, drug-seeking disorder, schizoaffective disorder, anxiety presenting with complaints of chest pain.  Patient states she was just watching TV tonight when she developed acute onset of pain in her chest.  Described pain as a pressure sensation to her left chest radiates to her left arm and jaw.  States she felt nauseous, short of breath, and fatigue.  Pain is moderate intensity no specific treatment tried.  Report prior history of heart attack in the setting of abusing prescription medication.  She denies abusing her medication at this time.  She did report changes in her blood pressure medication as well as her psych medication approximate 2 weeks ago.  She denies alcohol or tobacco use and denies drug use.  Home Medications Prior to Admission medications   Medication Sig Start Date End Date Taking? Authorizing Provider  albuterol (VENTOLIN HFA) 108 (90 Base) MCG/ACT inhaler Inhale 2 puffs into the lungs every 6 (six) hours as needed for wheezing or shortness of breath.    [provider]  BREYNA 160-4.5 MCG/ACT inhaler Inhale 1 puff into the lungs daily. 04/18/22   [provider]  Cyanocobalamin (VITAMIN B-12 PO) Take 1 capsule by mouth at bedtime.    [provider]  diclofenac Sodium (VOLTAREN) 1 % GEL Apply 1 Application topically 4 (four) times daily.    [provider]  escitalopram (LEXAPRO) 10 MG tablet Take 1 tablet (10 mg total) by mouth daily for 14 days. 02/07/22 05/06/22  Massengill, Ovid Curd, MD  hydrOXYzine (ATARAX) 25 MG tablet Take 25 mg twice daily by mouth as needed for anxiety. Take 100  mg by mouth for sleep as needed at bedtime. Patient taking differently: Take 25-100 mg by mouth See admin instructions. Take one tablet daily as needed for anxiety and four tablets at bedtime as needed for sleep. 04/30/22   Massengill, Ovid Curd, MD  ibuprofen (ADVIL) 200 MG tablet Take 200 mg by mouth every 6 (six) hours as needed for moderate pain.    [provider]  Melatonin 3 MG CAPS Take 3 mg by mouth at bedtime.    [provider]  metoprolol succinate (TOPROL-XL) 25 MG 24 hr tablet Take 1 tablet (25 mg total) by mouth daily for 7 days. 3/Evelyn/24 05/08/22  Massengill, Ovid Curd, MD  OLANZapine (ZYPREXA) 5 MG tablet Take 1 tablet (5 mg total) by mouth daily as needed for up to 7 days (anxiety). Patient taking differently: Take 5 mg by mouth at bedtime. 04/30/22 05/07/22  Massengill, Ovid Curd, MD  pantoprazole (PROTONIX) 40 MG tablet Take 1 tablet (40 mg total) by mouth daily for 14 days. 02/07/22 05/06/22  Massengill, Ovid Curd, MD  prazosin (MINIPRESS) 1 MG capsule Take 1 capsule (1 mg total) by mouth at bedtime for 14 days. 02/06/22 05/06/22  Massengill, Ovid Curd, MD  pregabalin (LYRICA) 100 MG capsule Take 100 mg by mouth 3 (three) times daily.    [provider]  traZODone (DESYREL) 50 MG tablet Take 1 tablet (50 mg total) by mouth at bedtime as needed for up to 7 days for sleep. Patient taking differently: Take 50 mg by mouth at bedtime. 04/30/22  05/07/22  Massengill, Ovid Curd, MD      Allergies    Bee venom, Coconut flavor, Fish allergy, Geodon [ziprasidone hcl], Haloperidol and related, Lithobid [lithium], Roxicodone [oxycodone], Seroquel [quetiapine], Shellfish allergy, Phenergan [promethazine hcl], Prilosec [omeprazole], Sulfa antibiotics, Tegretol [carbamazepine], Prozac [fluoxetine], Tape, and Tylenol [acetaminophen]    Review of Systems   Review of Systems  All other systems reviewed and are negative.   Physical Exam Updated Vital Signs BP 111/73 (BP Location: Right Arm)    Pulse (!) 101   Temp 98.2 F (36.8 C) (Oral)   Resp 18   Ht 5\' 4"  (1.626 m)   Wt 90.7 kg   LMP 04/25/2022 (Exact Date)   SpO2 98%   BMI 34.33 kg/m  Physical Exam Vitals and nursing note reviewed.  Constitutional:      General: She is not in acute distress.    Appearance: She is well-developed.  HENT:     Head: Atraumatic.  Eyes:     Conjunctiva/sclera: Conjunctivae normal.  Cardiovascular:     Rate and Rhythm: Normal rate and regular rhythm.     Pulses: Normal pulses.     Heart sounds: Normal heart sounds.  Pulmonary:     Effort: Pulmonary effort is normal.     Breath sounds: No wheezing, rhonchi or rales.  Abdominal:     Palpations: Abdomen is soft.     Tenderness: There is no abdominal tenderness.  Musculoskeletal:     Cervical back: Neck supple.  Skin:    Findings: No rash.  Neurological:     Mental Status: She is alert.  Psychiatric:        Mood and Affect: Mood normal.     ED Results / Procedures / Treatments   Labs (all labs ordered are listed, but only abnormal results are displayed) Labs Reviewed  BASIC METABOLIC PANEL - Abnormal; Notable for the following components:      Result Value   CO2 20 (*)    Glucose, Bld 119 (*)    All other components within normal limits  CBC - Abnormal; Notable for the following components:   Hemoglobin 11.5 (*)    MCH 25.4 (*)    All other components within normal limits  TROPONIN I (HIGH SENSITIVITY)  TROPONIN I (HIGH SENSITIVITY)    EKG None ED ECG REPORT   Date: 05/10/2022  Rate: 109  Rhythm: sinus tachycardia  QRS Axis: right  Intervals: normal  ST/T Wave abnormalities: nonspecific T wave changes  Conduction Disutrbances:none  Narrative Interpretation:   Old EKG Reviewed: unchanged  I have personally reviewed the EKG tracing and agree with the computerized printout as noted.   Radiology DG Chest Port 1 View  Result Date: 05/10/2022 CLINICAL DATA:  Chest pain EXAM: PORTABLE CHEST 1 VIEW COMPARISON:   04/13/2022 FINDINGS: Low lung volumes accentuate cardiomediastinal silhouette and pulmonary vascularity. No focal consolidation, pleural effusion, or pneumothorax. No acute osseous abnormality. IMPRESSION: No active disease. Electronically Signed   By: Placido Sou M.D.   On: 05/10/2022 01:29    Procedures Procedures    Medications Ordered in ED Medications  nitroGLYCERIN (NITROSTAT) SL tablet 0.4 mg (0.4 mg Sublingual Given 05/10/22 0150)  aspirin chewable tablet 324 mg (324 mg Oral Given 05/10/22 0150)  ondansetron (ZOFRAN) injection 4 mg (4 mg Intravenous Given 05/10/22 0150)    ED Course/ Medical Decision Making/ A&P  Medical Decision Making Amount and/or Complexity of Data Reviewed Labs: ordered. Radiology: ordered.  Risk OTC drugs. Prescription drug management.   BP 111/73 (BP Location: Right Arm)   Pulse (!) 101   Temp 98.2 F (36.8 C) (Oral)   Resp 18   Ht 5\' 4"  (1.626 m)   Wt 90.7 kg   LMP 04/25/2022 (Exact Date)   SpO2 98%   BMI 34.33 kg/m   Evelyn AM 32 year old Ward significant history of autism, drug-seeking disorder, schizoaffective disorder, anxiety presenting with complaints of chest pain.  Patient states she was just watching TV tonight when she developed acute onset of pain in her chest.  Described pain as a pressure sensation to her left chest radiates to her left arm and jaw.  States she felt nauseous, short of breath, and fatigue.  Pain is moderate intensity no specific treatment tried.  Report prior history of heart attack in the setting of abusing prescription medication.  She denies abusing her medication at this time.  She did report changes in her blood pressure medication as well as her psych medication approximate 2 weeks ago.  She denies alcohol or tobacco use and denies drug use.  On exam patient is laying in bed holding the left side of the chest but nontoxic in appearance.  Heart with normal rate and rhythm, lungs are  clear to auscultation bilaterally abdomen soft nontender no peripheral edema noted.  No reproducible chest wall tenderness.  -Labs ordered, independently viewed and interpreted by me.  Labs remarkable for normal delta trop, doubt ACS.  Electrolytes panel are reassuring.  -The patient was maintained on a cardiac monitor.  I personally viewed and interpreted the cardiac monitored which showed an underlying rhythm of: NSR -Imaging independently viewed and interpreted by me and I agree with radiologist's interpretation.  Result remarkable for CXR without acute changes -This patient presents to the ED for concern of chest pain, this involves an extensive number of treatment options, and is a complaint that carries with it a high risk of complications and morbidity.  The differential diagnosis includes acs, pna, gerd, anxiety, shingles -Co morbidities that complicate the patient evaluation includes cardiac disease -Treatment includes morphine, zofran, sl nitro, asa -Reevaluation of the patient after these medicines showed that the patient improved -PCP office notes or outside notes reviewed -Escalation to admission/observation considered: patients feels much better, is comfortable with discharge, and will follow up with PCP -Prescription medication considered, patient comfortable with home medication -Social Determinant of Health considered   On reassessment patient is resting comfortably and in no acute discomfort.  Symptoms has since resolved.  PE considered but felt low risk.  Encourage patient to follow-up with her cardiologist for outpatient evaluation of her symptoms.  Return precaution given.        Final Clinical Impression(s) / ED Diagnoses Final diagnoses:  Chest pain, unspecified type    Rx / DC Orders ED Discharge Orders          Ordered    Ambulatory referral to Cardiology       Comments: If you have not heard from the Cardiology office within the next 72 hours please call  [phone].   05/10/22 0451              Domenic Moras, PA-C 05/10/22 [place], [place], DO 05/10/22 [phone]

## 2022-05-13 ENCOUNTER — Emergency Department (HOSPITAL_COMMUNITY)
Admission: EM | Admit: 2022-05-13 | Discharge: 2022-05-13 | Disposition: A | Discharge status: Home / Self Care | Payer: Medicare PPO | Source: Home / Self Care | Attending: Emergency Medicine | Admitting: Emergency Medicine

## 2022-05-13 ENCOUNTER — Emergency Department (HOSPITAL_COMMUNITY)
Admission: EM | Admit: 2022-05-13 | Discharge: 2022-05-13 | Disposition: A | Discharge status: Home / Self Care | Payer: Medicare PPO | Attending: Emergency Medicine | Admitting: Emergency Medicine

## 2022-05-13 ENCOUNTER — Other Ambulatory Visit: Payer: Self-pay

## 2022-05-13 ENCOUNTER — Encounter (HOSPITAL_COMMUNITY): Payer: Self-pay

## 2022-05-13 DIAGNOSIS — R079 Chest pain, unspecified: Secondary | ICD-10-CM | POA: Diagnosis not present

## 2022-05-13 DIAGNOSIS — R000 Tachycardia, unspecified: Secondary | ICD-10-CM | POA: Insufficient documentation

## 2022-05-13 DIAGNOSIS — R002 Palpitations: Secondary | ICD-10-CM | POA: Diagnosis not present

## 2022-05-13 DIAGNOSIS — R202 Paresthesia of skin: Secondary | ICD-10-CM | POA: Insufficient documentation

## 2022-05-13 DIAGNOSIS — R110 Nausea: Secondary | ICD-10-CM | POA: Insufficient documentation

## 2022-05-13 DIAGNOSIS — R0602 Shortness of breath: Secondary | ICD-10-CM | POA: Insufficient documentation

## 2022-05-13 LAB — TSH: TSH: 1.353 u[IU]/mL (ref 0.350–4.500)

## 2022-05-13 LAB — BASIC METABOLIC PANEL
Anion gap: 8 (ref 5–15)
BUN: 6 mg/dL (ref 6–20)
CO2: 24 mmol/L (ref 22–32)
Calcium: 8.6 mg/dL — ABNORMAL LOW (ref 8.9–10.3)
Chloride: 107 mmol/L (ref 98–111)
Creatinine, Ser: 0.75 mg/dL (ref 0.44–1.00)
GFR, Estimated: >60 mL/min (ref >60)
Glucose, Bld: 98 mg/dL (ref 70–99)
Potassium: 3.6 mmol/L (ref 3.5–5.1)
Sodium: 139 mmol/L (ref 135–145)

## 2022-05-13 LAB — CBC WITH DIFFERENTIAL/PLATELET
Abs Immature Granulocytes: 0.03 10*3/uL (ref 0.00–0.07)
Basophils Absolute: 0 10*3/uL (ref 0.0–0.1)
Basophils Relative: 0 %
Eosinophils Absolute: 0.1 10*3/uL (ref 0.0–0.5)
Eosinophils Relative: 1 %
HCT: 35.4 % — ABNORMAL LOW (ref 36.0–46.0)
Hemoglobin: 10.9 g/dL — ABNORMAL LOW (ref 12.0–15.0)
Immature Granulocytes: 0 %
Lymphocytes Relative: 32 %
Lymphs Abs: 2.4 10*3/uL (ref 0.7–4.0)
MCH: 24.9 pg — ABNORMAL LOW (ref 26.0–34.0)
MCHC: 30.8 g/dL (ref 30.0–36.0)
MCV: 80.8 fL (ref 80.0–100.0)
Monocytes Absolute: 0.5 10*3/uL (ref 0.1–1.0)
Monocytes Relative: 7 %
Neutro Abs: 4.5 10*3/uL (ref 1.7–7.7)
Neutrophils Relative %: 60 %
Platelets: 248 10*3/uL (ref 150–400)
RBC: 4.38 MIL/uL (ref 3.87–5.11)
RDW: 14.6 % (ref 11.5–15.5)
WBC: 7.6 10*3/uL (ref 4.0–10.5)
nRBC: 0 % (ref 0.0–0.2)

## 2022-05-13 LAB — CBC
HCT: 38.4 % (ref 36.0–46.0)
Hemoglobin: 11.9 g/dL — ABNORMAL LOW (ref 12.0–15.0)
MCH: 25.3 pg — ABNORMAL LOW (ref 26.0–34.0)
MCHC: 31 g/dL (ref 30.0–36.0)
MCV: 81.7 fL (ref 80.0–100.0)
Platelets: 266 10*3/uL (ref 150–400)
RBC: 4.7 MIL/uL (ref 3.87–5.11)
RDW: 14.8 % (ref 11.5–15.5)
WBC: 10.1 10*3/uL (ref 4.0–10.5)
nRBC: 0 % (ref 0.0–0.2)

## 2022-05-13 LAB — COMPREHENSIVE METABOLIC PANEL
ALT: 19 U/L (ref 0–44)
AST: 24 U/L (ref 15–41)
Albumin: 3.6 g/dL (ref 3.5–5.0)
Alkaline Phosphatase: 78 U/L (ref 38–126)
Anion gap: 12 (ref 5–15)
BUN: 5 mg/dL — ABNORMAL LOW (ref 6–20)
CO2: 24 mmol/L (ref 22–32)
Calcium: 9 mg/dL (ref 8.9–10.3)
Chloride: 104 mmol/L (ref 98–111)
Creatinine, Ser: 0.85 mg/dL (ref 0.44–1.00)
GFR, Estimated: >60 mL/min (ref >60)
Glucose, Bld: 122 mg/dL — ABNORMAL HIGH (ref 70–99)
Potassium: 3.5 mmol/L (ref 3.5–5.1)
Sodium: 140 mmol/L (ref 135–145)
Total Bilirubin: 0.5 mg/dL (ref 0.3–1.2)
Total Protein: 6.8 g/dL (ref 6.5–8.1)

## 2022-05-13 LAB — I-STAT BETA HCG BLOOD, ED (MC, WL, AP ONLY): I-stat hCG, quantitative: <5 m[IU]/mL (ref <5)

## 2022-05-13 LAB — D-DIMER, QUANTITATIVE: D-Dimer, Quant: <0.27 ug/mL-FEU (ref 0.00–0.50)

## 2022-05-13 MED ORDER — SODIUM CHLORIDE 0.9 % IV BOLUS
1000.0000 mL | Freq: Once | INTRAVENOUS | Status: AC
  Administered 2022-05-13: 1000 mL via INTRAVENOUS

## 2022-05-13 NOTE — ED Triage Notes (Signed)
Pt arrives from home via [facility] with c/o leg swelling that is worse today. Called PCP, wanted her eval at the ED. Feels her heart beating fast. Hx of DVT LLE 110/59, hr 108, 99% RA.

## 2022-05-13 NOTE — Discharge Instructions (Signed)
No certain cause of your symptoms was found tonight.  We recommend that you follow-up with your regular doctor and your cardiologist.  You may need to follow-up with a neurologist if your symptoms do not improve.

## 2022-05-13 NOTE — ED Triage Notes (Addendum)
Pt arrived via [facility] for numbness/tingling in bilateral lower extremities, dizziness, tachycardia, shortness of breath, nausea from home. Pt reports she checked her heart rate at home and it was 120, reports dizziness, shoulder pain from recent vaccination (TDAP). Pt ambulatory on scene, A&Ox4, GCS 15. Pt reports she took a rescue inhaler before EMS arrived and her anxiety medication.   PTA EMS Vitals  BP 136/68 HR 118 SPO2 98% RA RR 18

## 2022-05-13 NOTE — Discharge Instructions (Signed)
Return for any problem.  As discussed, follow-up closely with your regular care providers in the outpatient setting.

## 2022-05-13 NOTE — ED Provider Notes (Signed)
[place] EMERGENCY DEPARTMENT AT [facility] Provider Note   CSN: [id number] Arrival date & time: 05/13/22  1929     History  Chief Complaint  Patient presents with   Palpitations    Teren Rushda Ritz is a 32 y.o. female.  32 year old female with prior medical history as detailed below presents for evaluation.  Patient reports that she has had mild edema to both lower extremities x 2 to 3 days.  Patient also complains of persistent palpitations x 2 to 3 days.  Patient was seen for same complaints within the last 24 hours here at the same facility.  Workup at that time was without significant abnormality or finding.    The history is provided by the patient and medical records.       Home Medications Prior to Admission medications   Medication Sig Start Date End Date Taking? Authorizing Provider  albuterol (VENTOLIN HFA) 108 (90 Base) MCG/ACT inhaler Inhale 2 puffs into the lungs every 6 (six) hours as needed for wheezing or shortness of breath.    [provider]  BREYNA 160-4.5 MCG/ACT inhaler Inhale 1 puff into the lungs daily. 04/18/22   [provider]  Cyanocobalamin (VITAMIN B-12 PO) Take 1 capsule by mouth at bedtime.    [provider]  diclofenac Sodium (VOLTAREN) 1 % GEL Apply 1 Application topically 4 (four) times daily.    [provider]  escitalopram (LEXAPRO) 10 MG tablet Take 1 tablet (10 mg total) by mouth daily for 14 days. 02/07/22 05/06/22  Massengill, Harrold Donath, MD  hydrOXYzine (ATARAX) 25 MG tablet Take 25 mg twice daily by mouth as needed for anxiety. Take 100 mg by mouth for sleep as needed at bedtime. Patient taking differently: Take 25-100 mg by mouth See admin instructions. Take one tablet daily as needed for anxiety and four tablets at bedtime as needed for sleep. 04/30/22   Massengill, Harrold Donath, MD  ibuprofen (ADVIL) 200 MG tablet Take 200 mg by mouth every 6 (six) hours as needed for moderate pain.     [provider]  Melatonin 3 MG CAPS Take 3 mg by mouth at bedtime.    [provider]  metoprolol succinate (TOPROL-XL) 25 MG 24 hr tablet Take 1 tablet (25 mg total) by mouth daily for 7 days. 05/01/22 05/08/22  Massengill, Harrold Donath, MD  OLANZapine (ZYPREXA) 5 MG tablet Take 1 tablet (5 mg total) by mouth daily as needed for up to 7 days (anxiety). Patient taking differently: Take 5 mg by mouth at bedtime. 04/30/22 05/07/22  Massengill, Harrold Donath, MD  pantoprazole (PROTONIX) 40 MG tablet Take 1 tablet (40 mg total) by mouth daily for 14 days. 02/07/22 05/06/22  Massengill, Harrold Donath, MD  prazosin (MINIPRESS) 1 MG capsule Take 1 capsule (1 mg total) by mouth at bedtime for 14 days. 02/06/22 05/06/22  Massengill, Harrold Donath, MD  pregabalin (LYRICA) 100 MG capsule Take 100 mg by mouth 3 (three) times daily.    [provider]  traZODone (DESYREL) 50 MG tablet Take 1 tablet (50 mg total) by mouth at bedtime as needed for up to 7 days for sleep. Patient taking differently: Take 50 mg by mouth at bedtime. 04/30/22 05/07/22  Massengill, Harrold Donath, MD      Allergies    Bee venom, Coconut flavor, Fish allergy, Geodon [ziprasidone hcl], Haloperidol and related, Lithobid [lithium], Roxicodone [oxycodone], Seroquel [quetiapine], Shellfish allergy, Phenergan [promethazine hcl], Prilosec [omeprazole], Sulfa antibiotics, Tegretol [carbamazepine], Prozac [fluoxetine], Tape, and Tylenol [acetaminophen]    Review  of Systems   Review of Systems  All other systems reviewed and are negative.   Physical Exam Updated Vital Signs BP 122/70   Pulse (!) 106   Temp 98.3 F (36.8 C) (Oral)   Resp 16   Ht 5\' 4"  (1.626 m)   Wt 90.7 kg   LMP 04/25/2022 (Exact Date)   SpO2 96%   BMI 34.33 kg/m  Physical Exam Vitals and nursing note reviewed.  Constitutional:      General: She is not in acute distress.    Appearance: Normal appearance. She is well-developed.  HENT:     Head: Normocephalic and atraumatic.   Eyes:     Conjunctiva/sclera: Conjunctivae normal.     Pupils: Pupils are equal, round, and reactive to light.  Cardiovascular:     Rate and Rhythm: Regular rhythm. Tachycardia present.     Heart sounds: Normal heart sounds.  Pulmonary:     Effort: Pulmonary effort is normal. No respiratory distress.     Breath sounds: Normal breath sounds.  Abdominal:     General: There is no distension.     Palpations: Abdomen is soft.     Tenderness: There is no abdominal tenderness.  Musculoskeletal:        General: No deformity. Normal range of motion.     Cervical back: Normal range of motion and neck supple.  Skin:    General: Skin is warm and dry.  Neurological:     General: No focal deficit present.     Mental Status: She is alert and oriented to person, place, and time.     ED Results / Procedures / Treatments   Labs (all labs ordered are listed, but only abnormal results are displayed) Labs Reviewed  BASIC METABOLIC PANEL - Abnormal; Notable for the following components:      Result Value   Calcium 8.6 (*)    All other components within normal limits  CBC WITH DIFFERENTIAL/PLATELET - Abnormal; Notable for the following components:   Hemoglobin 10.9 (*)    HCT 35.4 (*)    MCH 24.9 (*)    All other components within normal limits    EKG EKG Interpretation  Date/Time:  Sunday May 13 2022 21:39:08 EDT Ventricular Rate:  104 PR Interval:  155 QRS Duration: 93 QT Interval:  353 QTC Calculation: 465 R Axis:   56 Text Interpretation: Sinus tachycardia Inferior infarct, old Anteroseptal infarct, age indeterminate Confirmed by Kristine Royal [phone]) on 05/13/2022 10:01:08 PM  Radiology No results found.  Procedures Procedures    Medications Ordered in ED Medications - No data to display  ED Course/ Medical Decision Making/ A&P                             Medical Decision Making Amount and/or Complexity of Data Reviewed Labs: ordered.    Medical Screen  Complete  This patient presented to the ED with complaint of palpitations, lower extremity edema.  This complaint involves an extensive number of treatment options. The initial differential diagnosis includes, but is not limited to, metabolic abnormality, arrhythmia, etc.  This presentation is: Acute, Chronic, Self-Limited, Previously Undiagnosed, Uncertain Prognosis, Complicated, Systemic Symptoms, and Threat to Life/Bodily Function  Patient is complaining of persistent palpitations and mild lower extremity edema.  Patient seen for same complaints within the last 24 hours.  Workup at that time was without significant abnormality.  Workup included TSH, D-dimer, baseline CBC, CMP.  Repeat workup  tonight does not reveal significant abnormality.  Patient without objective findings of significant pathology.  Patient is reassured by workup findings.  Patient has already established follow-up with her PCP on Tuesday of this week.  Importance of close follow-up is stressed.  Strict return precautions given and understood.   Additional history obtained:  External records from outside sources obtained and reviewed including prior ED visits and prior Inpatient records.    Lab Tests:  I ordered and personally interpreted labs.  The pertinent results include: CBC, BMP   Cardiac Monitoring:  The patient was maintained on a cardiac monitor.  I personally viewed and interpreted the cardiac monitor which showed an underlying rhythm of: Mild sinus tachycardia  Problem List / ED Course:  Palpitations   Reevaluation:  After the interventions noted above, I reevaluated the patient and found that they have: improved  Disposition:  After consideration of the diagnostic results and the patients response to treatment, I feel that the patent would benefit from close outpatient follow-up.          Final Clinical Impression(s) / ED Diagnoses Final diagnoses:  Palpitations    Rx /  DC Orders ED Discharge Orders     None         Wynetta FinesMessick, Jasiah Buntin C, MD 05/13/22 2322

## 2022-05-13 NOTE — ED Provider Notes (Signed)
[facility]-EMERGENCY DEPT [facility] Emergency Department Provider Note MRN:  [id number]  Arrival date & time: 05/13/22     Chief Complaint   Numbness (Legs), Dizziness, Tachycardia, Nausea, and Shortness of Breath   History of Present Illness   [name] is a 32 y.o. year-old female presents to the ED with chief complaint of tachycardia and tingling sensation in hands and feet.  She states the symptoms started earlier today.  She also says she felt nauseated and short of breath.  She used her inhaler.  She states that she took her anxiety pill and does not feel anxious now.  She was seen a couple days ago for chest pain and has follow-up with her cardiologist on Monday.  She states that she recently increased her metoprolol for tachycardia.  She denies fevers, chills, cough, recent illness.  History provided by patient.   Review of Systems  Pertinent positive and negative review of systems noted in HPI.    Physical Exam   Vitals:   05/13/22 0415 05/13/22 0425  BP: 107/86   Pulse: (!) 112   Resp: 20   Temp:  97.8 F (36.6 C)  SpO2: 99%     CONSTITUTIONAL:  well-appearing, NAD NEURO:  Alert and oriented x 3, CN 3-12 grossly intact, normal sensation in extremities EYES:  eyes equal and reactive ENT/NECK:  Supple, no stridor  CARDIO:  tachycardic, regular rhythm, appears well-perfused  PULM:  No respiratory distress, CTAB GI/GU:  non-distended,  MSK/SPINE:  No gross deformities, no edema, moves all extremities  SKIN:  no rash, atraumatic   *Additional and/or pertinent findings included in MDM below  Diagnostic and Interventional Summary    EKG Interpretation  Date/Time:  Sunday May 13 2022 00:54:26 EDT Ventricular Rate:  120 PR Interval:  121 QRS Duration: 87 QT Interval:  366 QTC Calculation: 518 R Axis:   69 Text Interpretation: Sinus tachycardia Inferior infarct, old Anterolateral Q wave, probably normal for age Prolonged QT interval Confirmed by  Kennis Carina [phone]) on 05/13/2022 3:50:00 AM       Labs Reviewed  CBC - Abnormal; Notable for the following components:      Result Value   Hemoglobin 11.9 (*)    MCH 25.3 (*)    All other components within normal limits  COMPREHENSIVE METABOLIC PANEL - Abnormal; Notable for the following components:   Glucose, Bld 122 (*)    BUN 5 (*)    All other components within normal limits  TSH  D-DIMER, QUANTITATIVE  I-STAT BETA HCG BLOOD, ED ([facility], [facility], [facility] ONLY)    No orders to display    Medications  sodium chloride 0.9 % bolus 1,000 mL (0 mLs Intravenous Stopped 05/13/22 0205)  sodium chloride 0.9 % bolus 1,000 mL (0 mLs Intravenous Stopped 05/13/22 7262)     Procedures  /  Critical Care Procedures  ED Course and Medical Decision Making  I have reviewed the triage vital signs, the nursing notes, and pertinent available records from the EMR.  Social Determinants Affecting Complexity of Care: Patient has no clinically significant social determinants affecting this chief complaint..   ED Course:    Medical Decision Making Patient here with multiple complaints.  Complains of tachycardia, nausea, paresthesias in her legs.  She has fairly benign exam save for the tachycardia.  She moves all of her extremities.  She does report that she has a diagnosis of tachycardia and takes metoprolol for this.  She states that this was recently increased during  a behavioral health admission.  D-dimer is negative.  TSH is within normal limits.  No significant electrolyte derangement.  She does not appear toxic.  She is afebrile.  I do not think that she requires further emergent workup, but do feel that she should follow-up with her regular doctor and/or cardiologist.  Amount and/or Complexity of Data Reviewed Labs: ordered.     Consultants: No consultations were needed in caring for this patient.   Treatment and Plan: Emergency department workup does not suggest an emergent condition requiring  admission or immediate intervention beyond  what has been performed at this time. The patient is safe for discharge and has  been instructed to return immediately for worsening symptoms, change in  symptoms or any other concerns  Patient seen by and discussed with attending physician, Dr. Pilar Plate, who has evaluated the patient and recommends outpatient follow-up.  Final Clinical Impressions(s) / ED Diagnoses     ICD-10-CM   1. Paresthesia  R20.2     2. Tachycardia  R00.0       ED Discharge Orders     None         Discharge Instructions Discussed with and Provided to Patient:     Discharge Instructions      No certain cause of your symptoms was found tonight.  We recommend that you follow-up with your regular doctor and your cardiologist.  You may need to follow-up with a neurologist if your symptoms do not improve.       Roxy Horseman, PA-C 05/13/22 0441    Sabas Sous, MD 05/14/22 [phone]

## 2022-05-13 NOTE — ED Triage Notes (Signed)
Pt says that her heart rate has been up and swelling in her legs is worse today than yesterday.

## 2022-05-15 ENCOUNTER — Ambulatory Visit (HOSPITAL_COMMUNITY)
Admission: EM | Admit: 2022-05-15 | Discharge: 2022-05-16 | Disposition: A | Discharge status: Home / Self Care | Payer: Medicare PPO | Attending: Psychiatry | Admitting: Psychiatry

## 2022-05-15 ENCOUNTER — Other Ambulatory Visit: Payer: Self-pay

## 2022-05-15 DIAGNOSIS — F439 Reaction to severe stress, unspecified: Secondary | ICD-10-CM | POA: Diagnosis present

## 2022-05-15 DIAGNOSIS — R45851 Suicidal ideations: Secondary | ICD-10-CM | POA: Diagnosis present

## 2022-05-15 DIAGNOSIS — F4323 Adjustment disorder with mixed anxiety and depressed mood: Secondary | ICD-10-CM | POA: Insufficient documentation

## 2022-05-15 DIAGNOSIS — Z1152 Encounter for screening for COVID-19: Secondary | ICD-10-CM | POA: Insufficient documentation

## 2022-05-15 DIAGNOSIS — F250 Schizoaffective disorder, bipolar type: Secondary | ICD-10-CM | POA: Insufficient documentation

## 2022-05-15 DIAGNOSIS — F4310 Post-traumatic stress disorder, unspecified: Secondary | ICD-10-CM | POA: Diagnosis present

## 2022-05-15 DIAGNOSIS — F603 Borderline personality disorder: Secondary | ICD-10-CM | POA: Diagnosis not present

## 2022-05-15 DIAGNOSIS — R4589 Other symptoms and signs involving emotional state: Secondary | ICD-10-CM

## 2022-05-15 MED ORDER — MAGNESIUM HYDROXIDE 400 MG/5ML PO SUSP: 30.0000 mL | Freq: Every day | ORAL | Status: DC | PRN

## 2022-05-15 MED ORDER — ALUM & MAG HYDROXIDE-SIMETH 200-200-20 MG/5ML PO SUSP: 30.0000 mL | ORAL | Status: DC | PRN

## 2022-05-15 MED ORDER — MELATONIN 3 MG PO TABS
3.0000 mg | ORAL_TABLET | Freq: Every day | ORAL | Status: DC
  Filled 2022-05-15: qty 1

## 2022-05-15 MED ORDER — LORAZEPAM 1 MG PO TABS
1.0000 mg | ORAL_TABLET | ORAL | Status: AC | PRN
  Administered 2022-05-16: 1 mg via ORAL
  Filled 2022-05-15: qty 1

## 2022-05-15 MED ORDER — TRAZODONE HCL 50 MG PO TABS
50.0000 mg | ORAL_TABLET | Freq: Every evening | ORAL | Status: DC | PRN
  Administered 2022-05-16: 50 mg via ORAL
  Filled 2022-05-15: qty 1

## 2022-05-15 MED ORDER — IBUPROFEN 200 MG PO TABS
200.0000 mg | ORAL_TABLET | Freq: Four times a day (QID) | ORAL | Status: DC | PRN
  Administered 2022-05-16: 200 mg via ORAL
  Filled 2022-05-15: qty 1

## 2022-05-15 NOTE — ED Provider Notes (Signed)
[facility] Continuous Assessment Admission H&P  Date: 05/16/22 Patient Name: Shadiyah Wernli MRN: [id number] Chief Complaint: SI with a plan to OD on here medications  Diagnoses:  Final diagnoses:  Suicidal ideation  Ineffective individual coping  Anxious appearance    HPI: Marylyn Ishihara, 32y/o female with a well-known history of bipolar disorder passive suicide ideation, schizoaffective disorder adjustment disorder.  Presented to [facility] voluntarily via [employer], complaining of suicidal ideation with plan to OD on her medications.  According to patient she does not feel that she has any support system because her CST team with [employer] decided to drop her because she wanted to go from 2 visits a week to 3 visits a week.  According to the patient she just feels like she does not have any support anymore.  Patient also stated she tried to see her daughter 3 times this week and her daughter's guardian and decided not to show up.  According to patient she was in the hospital this week and was prescribed medication for irregular heart rate.  Patient stated she already took her night medicine except for sleeping medicine.  Copied from triage notes:  presents to [facility] voluntarily, via law enforcement with complaint of SI with a plan to overdose on medications. Pt also reports HI and feels like she wants to hurt her family, but has no plan or intent. Pt states " I lost my entire support system". Pt reports receiving CST services through [facility] in [place] and she lost her original team members whom she had been working with today. Pt reports that they switched her team without her knowledge because she demanded to be seen 3x a week. Pt also reports hearing voices telling her to kill herself. Pt denies VH and substance/alcohol use.    Face-to-face observation of patient, patient is alert and oriented x 4, speech is clear, maintaining eye contact.  Play patient is a little bit anxious.  Patient  endorsed suicidal ideation with plans to OD on her medications, patient denies HI, AVH or paranoia at this time.  Patient denies alcohol or illicit drug use.  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  At this time patient does not seem to be influenced by external or internal stimuli.  Will not order any blood lab work,  pt had labs drawn on 05/13/22  Recommend overnight observation.   Total Time spent with patient: 20 minutes  Musculoskeletal  Strength & Muscle Tone: within normal limits Gait & Station: normal Patient leans: N/A  Psychiatric Specialty Exam  Presentation General Appearance:  Casual  Eye Contact: Good  Speech: Clear and Coherent  Speech Volume: Normal  Handedness: Right   Mood and Affect  Mood: Anxious  Affect: Congruent   Thought Process  Thought Processes: Coherent  Descriptions of Associations:Circumstantial  Orientation:Full (Time, Place and Person)  Thought Content:Logical  Diagnosis of Schizophrenia or Schizoaffective disorder in past: Yes  Duration of Psychotic Symptoms: Greater than six months  Hallucinations:Hallucinations: None  Ideas of Reference:None  Suicidal Thoughts:Suicidal Thoughts: Yes, Passive SI Active Intent and/or Plan: With Intent; With Plan SI Passive Intent and/or Plan: With Intent; With Plan  Homicidal Thoughts:Homicidal Thoughts: No   Sensorium  Memory: Immediate Good  Judgment: Poor  Insight: Fair   Chartered certified accountant: Fair  Attention Span: Fair  Recall: Good  Fund of Knowledge: Good  Language: Good   Psychomotor Activity  Psychomotor Activity: Psychomotor Activity: Normal  Assets  Assets: Desire for Improvement; Resilience   Sleep  Sleep: Sleep: Fair Number of Hours of Sleep: 7   Nutritional Assessment (For OBS and FBC admissions only) Has the patient had a weight loss or gain of 10 pounds or more in  the last 3 months?: No Has the patient had a decrease in food intake/or appetite?: No Does the patient have dental problems?: No Does the patient have eating habits or behaviors that may be indicators of an eating disorder including binging or inducing vomiting?: No Has the patient recently lost weight without trying?: 0 Has the patient been eating poorly because of a decreased appetite?: 0 Malnutrition Screening Tool Score: 0    Physical Exam HENT:     Head: Normocephalic.     Nose: Nose normal.  Cardiovascular:     Rate and Rhythm: Normal rate.  Pulmonary:     Effort: Pulmonary effort is normal.  Musculoskeletal:        General: Normal range of motion.     Cervical back: Normal range of motion.  Neurological:     General: No focal deficit present.     Mental Status: She is alert.  Psychiatric:        Mood and Affect: Mood normal.        Behavior: Behavior normal.        Thought Content: Thought content normal.        Judgment: Judgment normal.    Review of Systems  Constitutional: Negative.   HENT: Negative.    Eyes: Negative.   Respiratory: Negative.    Cardiovascular: Negative.   Gastrointestinal: Negative.   Genitourinary: Negative.   Musculoskeletal: Negative.   Skin: Negative.   Neurological: Negative.   Psychiatric/Behavioral:  Positive for depression and suicidal ideas. The patient is nervous/anxious.     Blood pressure 108/61, pulse (!) 111, temperature 98.8 F (37.1 C), temperature source Oral, resp. rate 18, last menstrual period 04/25/2022, SpO2 98 %. There is no height or weight on file to calculate BMI.  Past Psychiatric History: Schizoaffective disorder, bipolar disorder, suicidal ideation  Is the patient at risk to self? Yes  Has the patient been a risk to self in the past 6 months? Yes .    Has the patient been a risk to self within the distant past? Yes   Is the patient a risk to others? No   Has the patient been a risk to others in the past 6  months? No   Has the patient been a risk to others within the distant past? No   Past Medical History: See chart  Family History: Unknown  Social History: Unknown  Last Labs:  Admission on 05/15/2022  Component Date Value Ref Range Status   SARS Coronavirus 2 by RT PCR 05/15/2022 NEGATIVE  NEGATIVE Final   Performed at [facility], [address], [place], [place] [address]   SARSCOV2ONAVIRUS 2 AG 05/16/2022 NEGATIVE  NEGATIVE Final   Comment: (NOTE) SARS-CoV-2 antigen NOT DETECTED.   Negative results are presumptive.  Negative results do not preclude SARS-CoV-2 infection and should not be used as the sole basis for treatment or other patient management decisions, including infection  control decisions, particularly in the presence of clinical signs and  symptoms consistent with COVID-19, or in those who have been in contact with the virus.  Negative results must be combined with clinical observations, patient history, and epidemiological information. The expected result is Negative.  Fact Sheet for Patients: https://www.jennings-kim.com/  Fact Sheet for Healthcare Providers: https://alexander-rogers.biz/  This test is not yet approved or cleared by the [place] FDA and  has been authorized for detection and/or diagnosis of SARS-CoV-2 by FDA under an Emergency Use Authorization (EUA).  This EUA will remain in effect (meaning this test can be used) for the duration of  the COV                          ID-19 declaration under Section 564(b)(1) of the Act, 21 U.S.C. section 360bbb-3(b)(1), unless the authorization is terminated or revoked sooner.    Admission on 05/13/2022, Discharged on 05/13/2022  Component Date Value Ref Range Status   Sodium 05/13/2022 139  135 - 145 mmol/L Final   Potassium 05/13/2022 3.6  3.5 - 5.1 mmol/L Final   Chloride 05/13/2022 107  98 - 111 mmol/L Final   CO2 05/13/2022 24  22 - 32 mmol/L Final   Glucose,  Bld 05/13/2022 98  70 - 99 mg/dL Final   Glucose reference range applies only to samples taken after fasting for at least 8 hours.   BUN 05/13/2022 6  6 - 20 mg/dL Final   Creatinine, Ser 05/13/2022 0.75  0.44 - 1.00 mg/dL Final   Calcium 96/05/5407 8.6 (L)  8.9 - 10.3 mg/dL Final   GFR, Estimated 05/13/2022 >60  >60 mL/min Final   Comment: (NOTE) Calculated using the CKD-EPI Creatinine Equation (2021)    Anion gap 05/13/2022 8  5 - 15 Final   Performed at [facility], [address], [place], [place] [address]   WBC 05/13/2022 7.6  4.0 - 10.5 K/uL Final   RBC 05/13/2022 4.38  3.87 - 5.11 MIL/uL Final   Hemoglobin 05/13/2022 10.9 (L)  12.0 - 15.0 g/dL Final   HCT 47/82/9562 35.4 (L)  36.0 - 46.0 % Final   MCV 05/13/2022 80.8  80.0 - 100.0 fL Final   MCH 05/13/2022 24.9 (L)  26.0 - 34.0 pg Final   MCHC 05/13/2022 30.8  30.0 - 36.0 g/dL Final   RDW 13/09/6576 14.6  11.5 - 15.5 % Final   Platelets 05/13/2022 248  150 - 400 K/uL Final   nRBC 05/13/2022 0.0  0.0 - 0.2 % Final   Neutrophils Relative % 05/13/2022 60  % Final   Neutro Abs 05/13/2022 4.5  1.7 - 7.7 K/uL Final   Lymphocytes Relative 05/13/2022 32  % Final   Lymphs Abs 05/13/2022 2.4  0.7 - 4.0 K/uL Final   Monocytes Relative 05/13/2022 7  % Final   Monocytes Absolute 05/13/2022 0.5  0.1 - 1.0 K/uL Final   Eosinophils Relative 05/13/2022 1  % Final   Eosinophils Absolute 05/13/2022 0.1  0.0 - 0.5 K/uL Final   Basophils Relative 05/13/2022 0  % Final   Basophils Absolute 05/13/2022 0.0  0.0 - 0.1 K/uL Final   Immature Granulocytes 05/13/2022 0  % Final   Abs Immature Granulocytes 05/13/2022 0.03  0.00 - 0.07 K/uL Final   Performed at [facility], [address] [place], [place] [address]  Admission on 05/13/2022, Discharged on 05/13/2022  Component Date Value Ref Range Status   WBC 05/13/2022 10.1  4.0 - 10.5 K/uL Final   RBC 05/13/2022 4.70  3.87 - 5.11 MIL/uL Final   Hemoglobin 05/13/2022 11.9 (L)  12.0 -  15.0 g/dL Final   HCT 95/28/4132 38.4  36.0 - 46.0 % Final   MCV 05/13/2022 81.7  80.0 -  100.0 fL Final   MCH 05/13/2022 25.3 (L)  26.0 - 34.0 pg Final   MCHC 05/13/2022 31.0  30.0 - 36.0 g/dL Final   RDW 83/10/4074 14.8  11.5 - 15.5 % Final   Platelets 05/13/2022 266  150 - 400 K/uL Final   nRBC 05/13/2022 0.0  0.0 - 0.2 % Final   Performed at [facility], [address], [place], [place] [address]   Sodium 05/13/2022 140  135 - 145 mmol/L Final   Potassium 05/13/2022 3.5  3.5 - 5.1 mmol/L Final   Chloride 05/13/2022 104  98 - 111 mmol/L Final   CO2 05/13/2022 24  22 - 32 mmol/L Final   Glucose, Bld 05/13/2022 122 (H)  70 - 99 mg/dL Final   Glucose reference range applies only to samples taken after fasting for at least 8 hours.   BUN 05/13/2022 5 (L)  6 - 20 mg/dL Final   Creatinine, Ser 05/13/2022 0.85  0.44 - 1.00 mg/dL Final   Calcium 12/06/5943 9.0  8.9 - 10.3 mg/dL Final   Total Protein 85/92/9244 6.8  6.5 - 8.1 g/dL Final   Albumin 62/86/3817 3.6  3.5 - 5.0 g/dL Final   AST 71/16/5790 24  15 - 41 U/L Final   ALT 05/13/2022 19  0 - 44 U/L Final   Alkaline Phosphatase 05/13/2022 78  38 - 126 U/L Final   Total Bilirubin 05/13/2022 0.5  0.3 - 1.2 mg/dL Final   GFR, Estimated 05/13/2022 >60  >60 mL/min Final   Comment: (NOTE) Calculated using the CKD-EPI Creatinine Equation (2021)    Anion gap 05/13/2022 12  5 - 15 Final   Performed at [facility], [address] [place], [place] [address]   TSH 05/13/2022 1.353  0.350 - 4.500 uIU/mL Final   Comment: Performed by a 3rd Generation assay with a functional sensitivity of <=0.01 uIU/mL. Performed at [facility], [address] [place], [place] [address]    D-Dimer, Quant 05/13/2022 <0.27  0.00 - 0.50 ug/mL-FEU Final   Comment: (NOTE) At the manufacturer cut-off value of 0.5 g/mL FEU, this assay has a negative predictive value of 95-100%.This assay is intended for use in conjunction with a clinical pretest  probability (PTP) assessment model to exclude pulmonary embolism (PE) and deep venous thrombosis (DVT) in outpatients suspected of PE or DVT. Results should be correlated with clinical presentation. Performed at [facility], [address] [place], [place] [address]    I-stat hCG, quantitative 05/13/2022 <5.0  <5 mIU/mL Final   Comment 3 05/13/2022          Final   Comment:   GEST. AGE      CONC.  (mIU/mL)   <=1 WEEK        5 - 50     2 WEEKS       50 - 500     3 WEEKS       100 - 10,000     4 WEEKS     1,000 - 30,000        FEMALE AND NON-PREGNANT FEMALE:     LESS THAN 5 mIU/mL   Admission on 05/09/2022, Discharged on 05/10/2022  Component Date Value Ref Range Status   Sodium 05/10/2022 137  135 - 145 mmol/L Final   Potassium 05/10/2022 3.9  3.5 - 5.1 mmol/L Final   Chloride 05/10/2022 103  98 - 111 mmol/L Final   CO2 05/10/2022 20 (L)  22 - 32 mmol/L Final   Glucose, Bld 05/10/2022 119 (H)  70 - 99 mg/dL Final   Glucose reference range applies only to samples taken after fasting for at least 8 hours.   BUN 05/10/2022 7  6 - 20 mg/dL Final   Creatinine, Ser 05/10/2022 0.67  0.44 - 1.00 mg/dL Final   Calcium 40/98/119104/05/2022 9.0  8.9 - 10.3 mg/dL Final   GFR, Estimated 05/10/2022 >60  >60 mL/min Final   Comment: (NOTE) Calculated using the CKD-EPI Creatinine Equation (2021)    Anion gap 05/10/2022 14  5 - 15 Final   Performed at [facility], [address] [place], [place] [address]   WBC 05/10/2022 9.1  4.0 - 10.5 K/uL Final   RBC 05/10/2022 4.52  3.87 - 5.11 MIL/uL Final   Hemoglobin 05/10/2022 11.5 (L)  12.0 - 15.0 g/dL Final   HCT 56/21/308604/05/2022 37.2  36.0 - 46.0 % Final   MCV 05/10/2022 82.3  80.0 - 100.0 fL Final   MCH 05/10/2022 25.4 (L)  26.0 - 34.0 pg Final   MCHC 05/10/2022 30.9  30.0 - 36.0 g/dL Final   RDW 57/84/696204/05/2022 14.5  11.5 - 15.5 % Final   Platelets 05/10/2022 283  150 - 400 K/uL Final   nRBC 05/10/2022 0.0  0.0 - 0.2 % Final   Performed at [facility], [address] [place], [place] [address]   Troponin I (High Sensitivity) 05/10/2022 <2  <18 ng/L Final   Comment: (NOTE) Elevated high sensitivity troponin I (hsTnI) values and significant  changes across serial measurements may suggest ACS but many other  chronic and acute conditions are known to elevate hsTnI results.  Refer to the "Links" section for chest pain algorithms and additional  guidance. Performed at [facility], [address] [place], [place] [address]    Troponin I (High Sensitivity) 05/10/2022 2  <18 ng/L Final   Comment: (NOTE) Elevated high sensitivity troponin I (hsTnI) values and significant  changes across serial measurements may suggest ACS but many other  chronic and acute conditions are known to elevate hsTnI results.  Refer to the "Links" section for chest pain algorithms and additional  guidance. Performed at [facility], [address] [place], [place] [address]   Admission on 05/06/2022, Discharged on 05/06/2022  Component Date Value Ref Range Status   Sodium 05/06/2022 136  135 - 145 mmol/L Final   Potassium 05/06/2022 3.7  3.5 - 5.1 mmol/L Final   Chloride 05/06/2022 104  98 - 111 mmol/L Final   CO2 05/06/2022 22  22 - 32 mmol/L Final   Glucose, Bld 05/06/2022 102 (H)  70 - 99 mg/dL Final   Glucose reference range applies only to samples taken after fasting for at least 8 hours.   BUN 05/06/2022 14  6 - 20 mg/dL Final   Creatinine, Ser 05/06/2022 0.66  0.44 - 1.00 mg/dL Final   Calcium 25/36/644003/31/2024 9.2  8.9 - 10.3 mg/dL Final   Total Protein 34/74/259503/31/2024 8.1  6.5 - 8.1 g/dL Final   Albumin 63/87/564303/31/2024 4.6  3.5 - 5.0 g/dL Final   AST 32/95/188403/31/2024 17  15 - 41 U/L Final   ALT 05/06/2022 20  0 - 44 U/L Final   Alkaline Phosphatase 05/06/2022 69  38 - 126 U/L Final   Total Bilirubin 05/06/2022 0.6  0.3 - 1.2 mg/dL Final   GFR, Estimated 05/06/2022 >60  >60 mL/min Final   Comment: (NOTE)  Calculated using the CKD-EPI Creatinine  Equation (2021)    Anion gap 05/06/2022 10  5 - 15 Final   Performed at [name], [address], [place], [place] [address]   Alcohol, Ethyl (B) 05/06/2022 <10  <10 mg/dL Final   Comment: (NOTE) Lowest detectable limit for serum alcohol is 10 mg/dL.  For medical purposes only. Performed at [facility], [address], [place], [place] [address]    Opiates 05/06/2022 NONE DETECTED  NONE DETECTED Final   Cocaine 05/06/2022 NONE DETECTED  NONE DETECTED Final   Benzodiazepines 05/06/2022 NONE DETECTED  NONE DETECTED Final   Amphetamines 05/06/2022 NONE DETECTED  NONE DETECTED Final   Tetrahydrocannabinol 05/06/2022 NONE DETECTED  NONE DETECTED Final   Barbiturates 05/06/2022 NONE DETECTED  NONE DETECTED Final   Comment: (NOTE) DRUG SCREEN FOR MEDICAL PURPOSES ONLY.  IF CONFIRMATION IS NEEDED FOR ANY PURPOSE, NOTIFY LAB WITHIN 5 DAYS.  LOWEST DETECTABLE LIMITS FOR URINE DRUG SCREEN Drug Class                     Cutoff (ng/mL) Amphetamine and metabolites    1000 Barbiturate and metabolites    200 Benzodiazepine                 200 Opiates and metabolites        300 Cocaine and metabolites        300 THC                            50 Performed at [facility], [address] [place], [place] [address]    WBC 05/06/2022 10.0  4.0 - 10.5 K/uL Final   RBC 05/06/2022 4.99  3.87 - 5.11 MIL/uL Final   Hemoglobin 05/06/2022 12.6  12.0 - 15.0 g/dL Final   HCT 96/29/5284 40.3  36.0 - 46.0 % Final   MCV 05/06/2022 80.8  80.0 - 100.0 fL Final   MCH 05/06/2022 25.3 (L)  26.0 - 34.0 pg Final   MCHC 05/06/2022 31.3  30.0 - 36.0 g/dL Final   RDW 13/24/4010 14.4  11.5 - 15.5 % Final   Platelets 05/06/2022 307  150 - 400 K/uL Final   nRBC 05/06/2022 0.0  0.0 - 0.2 % Final   Neutrophils Relative % 05/06/2022 63  % Final   Neutro Abs 05/06/2022 6.2  1.7 - 7.7 K/uL Final   Lymphocytes Relative 05/06/2022 31  % Final   Lymphs Abs  05/06/2022 3.1  0.7 - 4.0 K/uL Final   Monocytes Relative 05/06/2022 5  % Final   Monocytes Absolute 05/06/2022 0.5  0.1 - 1.0 K/uL Final   Eosinophils Relative 05/06/2022 1  % Final   Eosinophils Absolute 05/06/2022 0.1  0.0 - 0.5 K/uL Final   Basophils Relative 05/06/2022 0  % Final   Basophils Absolute 05/06/2022 0.0  0.0 - 0.1 K/uL Final   Immature Granulocytes 05/06/2022 0  % Final   Abs Immature Granulocytes 05/06/2022 0.02  0.00 - 0.07 K/uL Final   Performed at [facility], [address] [place], [place] [address]   Preg Test, Ur 05/06/2022 NEGATIVE  NEGATIVE Final   Comment:        THE SENSITIVITY OF THIS METHODOLOGY IS >20 mIU/mL. Performed at [facility], [address], [place], [place] [address]   Admission on 05/03/2022, Discharged on 05/04/2022  Component Date Value Ref Range  Status   WBC 05/04/2022 8.2  4.0 - 10.5 K/uL Final   RBC 05/04/2022 4.89  3.87 - 5.11 MIL/uL Final   Hemoglobin 05/04/2022 12.5  12.0 - 15.0 g/dL Final   HCT 16/11/9602 40.0  36.0 - 46.0 % Final   MCV 05/04/2022 81.8  80.0 - 100.0 fL Final   MCH 05/04/2022 25.6 (L)  26.0 - 34.0 pg Final   MCHC 05/04/2022 31.3  30.0 - 36.0 g/dL Final   RDW 54/10/8117 14.6  11.5 - 15.5 % Final   Platelets 05/04/2022 290  150 - 400 K/uL Final   nRBC 05/04/2022 0.0  0.0 - 0.2 % Final   Performed at [facility], [address] [place], [place] [address]   Sodium 05/04/2022 137  135 - 145 mmol/L Final   Potassium 05/04/2022 3.4 (L)  3.5 - 5.1 mmol/L Final   Chloride 05/04/2022 107  98 - 111 mmol/L Final   CO2 05/04/2022 22  22 - 32 mmol/L Final   Glucose, Bld 05/04/2022 103 (H)  70 - 99 mg/dL Final   Glucose reference range applies only to samples taken after fasting for at least 8 hours.   BUN 05/04/2022 12  6 - 20 mg/dL Final   Creatinine, Ser 05/04/2022 0.69  0.44 - 1.00 mg/dL Final   Calcium 95/62/1308 8.8 (L)  8.9 - 10.3 mg/dL Final   Total Protein  05/04/2022 7.5  6.5 - 8.1 g/dL Final   Albumin 65/78/4696 4.3  3.5 - 5.0 g/dL Final   AST 29/52/8413 19  15 - 41 U/L Final   ALT 05/04/2022 21  0 - 44 U/L Final   Alkaline Phosphatase 05/04/2022 66  38 - 126 U/L Final   Total Bilirubin 05/04/2022 0.4  0.3 - 1.2 mg/dL Final   GFR, Estimated 05/04/2022 >60  >60 mL/min Final   Comment: (NOTE) Calculated using the CKD-EPI Creatinine Equation (2021)    Anion gap 05/04/2022 8  5 - 15 Final   Performed at [facility], [address], [place], [place] [address]   Opiates 05/03/2022 NONE DETECTED  NONE DETECTED Final   Cocaine 05/03/2022 NONE DETECTED  NONE DETECTED Final   Benzodiazepines 05/03/2022 NONE DETECTED  NONE DETECTED Final   Amphetamines 05/03/2022 NONE DETECTED  NONE DETECTED Final   Tetrahydrocannabinol 05/03/2022 NONE DETECTED  NONE DETECTED Final   Barbiturates 05/03/2022 NONE DETECTED  NONE DETECTED Final   Comment: (NOTE) DRUG SCREEN FOR MEDICAL PURPOSES ONLY.  IF CONFIRMATION IS NEEDED FOR ANY PURPOSE, NOTIFY LAB WITHIN 5 DAYS.  LOWEST DETECTABLE LIMITS FOR URINE DRUG SCREEN Drug Class                     Cutoff (ng/mL) Amphetamine and metabolites    1000 Barbiturate and metabolites    200 Benzodiazepine                 200 Opiates and metabolites        300 Cocaine and metabolites        300 THC                            50 Performed at [facility], [address] [place], [place] [address]    Alcohol, Ethyl (B) 05/04/2022 <10  <10 mg/dL Final   Comment: (NOTE) Lowest detectable limit for serum alcohol is 10 mg/dL.  For medical purposes only. Performed at [facility], [address] [place], [place] [address]    I-stat hCG, quantitative 05/04/2022 <5.0  <5 mIU/mL Final   Comment 3 05/04/2022          Final   Comment:   GEST. AGE      CONC.  (mIU/mL)   <=1 WEEK        5 - 50     2 WEEKS       50 - 500     3 WEEKS       100 - 10,000     4 WEEKS      1,000 - 30,000        FEMALE AND NON-PREGNANT FEMALE:     LESS THAN 5 mIU/mL    SARS Coronavirus 2 by RT PCR 05/04/2022 NEGATIVE  NEGATIVE Final   Comment: (NOTE) SARS-CoV-2 target nucleic acids are NOT DETECTED.  The SARS-CoV-2 RNA is generally detectable in upper respiratory specimens during the acute phase of infection. The lowest concentration of SARS-CoV-2 viral copies this assay can detect is 138 copies/mL. A negative result does not preclude SARS-Cov-2 infection and should not be used as the sole basis for treatment or other patient management decisions. A negative result may occur with  improper specimen collection/handling, submission of specimen other than nasopharyngeal swab, presence of viral mutation(s) within the areas targeted by this assay, and inadequate number of viral copies(<138 copies/mL). A negative result must be combined with clinical observations, patient history, and epidemiological information. The expected result is Negative.  Fact Sheet for Patients:  BloggerCourse.com  Fact Sheet for Healthcare Providers:  SeriousBroker.it  This test is no                          t yet approved or cleared by the [place] FDA and  has been authorized for detection and/or diagnosis of SARS-CoV-2 by FDA under an Emergency Use Authorization (EUA). This EUA will remain  in effect (meaning this test can be used) for the duration of the COVID-19 declaration under Section 564(b)(1) of the Act, 21 U.S.C.section 360bbb-3(b)(1), unless the authorization is terminated  or revoked sooner.       Influenza A by PCR 05/04/2022 NEGATIVE  NEGATIVE Final   Influenza B by PCR 05/04/2022 NEGATIVE  NEGATIVE Final   Comment: (NOTE) The Xpert Xpress SARS-CoV-2/FLU/RSV plus assay is intended as an aid in the diagnosis of influenza from Nasopharyngeal swab specimens and should not be used as a sole basis for treatment. Nasal  washings and aspirates are unacceptable for Xpert Xpress SARS-CoV-2/FLU/RSV testing.  Fact Sheet for Patients: BloggerCourse.com  Fact Sheet for Healthcare Providers: SeriousBroker.it  This test is not yet approved or cleared by the [place] FDA and has been authorized for detection and/or diagnosis of SARS-CoV-2 by FDA under an Emergency Use Authorization (EUA). This EUA will remain in effect (meaning this test can be used) for the duration of the COVID-19 declaration under Section 564(b)(1) of the Act, 21 U.S.C. section 360bbb-3(b)(1), unless the authorization is terminated or revoked.     Resp Syncytial Virus by PCR 05/04/2022 NEGATIVE  NEGATIVE Final   Comment: (NOTE) Fact Sheet for Patients: BloggerCourse.com  Fact Sheet for Healthcare Providers: SeriousBroker.it  This test is not yet approved or cleared by the [place] FDA and has been authorized for detection and/or diagnosis of SARS-CoV-2 by FDA under an Emergency Use Authorization (EUA). This EUA will remain in effect (meaning this test can  be used) for the duration of the COVID-19 declaration under Section 564(b)(1) of the Act, 21 U.S.C. section 360bbb-3(b)(1), unless the authorization is terminated or revoked.  Performed at [facility], [address] [place], [place] [address]   Admission on 05/02/2022, Discharged on 05/03/2022  Component Date Value Ref Range Status   Sodium 05/02/2022 140  135 - 145 mmol/L Final   Potassium 05/02/2022 3.6  3.5 - 5.1 mmol/L Final   Chloride 05/02/2022 111  98 - 111 mmol/L Final   CO2 05/02/2022 20 (L)  22 - 32 mmol/L Final   Glucose, Bld 05/02/2022 112 (H)  70 - 99 mg/dL Final   Glucose reference range applies only to samples taken after fasting for at least 8 hours.   BUN 05/02/2022 14  6 - 20 mg/dL Final   Creatinine, Ser 05/02/2022 0.65  0.44 -  1.00 mg/dL Final   Calcium 60/45/4098 8.7 (L)  8.9 - 10.3 mg/dL Final   Total Protein 11/91/4782 7.5  6.5 - 8.1 g/dL Final   Albumin 95/62/1308 4.1  3.5 - 5.0 g/dL Final   AST 65/78/4696 21  15 - 41 U/L Final   ALT 05/02/2022 22  0 - 44 U/L Final   Alkaline Phosphatase 05/02/2022 68  38 - 126 U/L Final   Total Bilirubin 05/02/2022 0.5  0.3 - 1.2 mg/dL Final   GFR, Estimated 05/02/2022 >60  >60 mL/min Final   Comment: (NOTE) Calculated using the CKD-EPI Creatinine Equation (2021)    Anion gap 05/02/2022 9  5 - 15 Final   Performed at [facility], [address], [place], [place] [address]   Alcohol, Ethyl (B) 05/02/2022 <10  <10 mg/dL Final   Comment: (NOTE) Lowest detectable limit for serum alcohol is 10 mg/dL.  For medical purposes only. Performed at [facility], [address] [place], [place] [address]    WBC 05/02/2022 9.8  4.0 - 10.5 K/uL Final   RBC 05/02/2022 4.70  3.87 - 5.11 MIL/uL Final   Hemoglobin 05/02/2022 11.9 (L)  12.0 - 15.0 g/dL Final   HCT 40/11/2723 38.2  36.0 - 46.0 % Final   MCV 05/02/2022 81.3  80.0 - 100.0 fL Final   MCH 05/02/2022 25.3 (L)  26.0 - 34.0 pg Final   MCHC 05/02/2022 31.2  30.0 - 36.0 g/dL Final   RDW 36/64/4034 14.6  11.5 - 15.5 % Final   Platelets 05/02/2022 320  150 - 400 K/uL Final   nRBC 05/02/2022 0.0  0.0 - 0.2 % Final   Neutrophils Relative % 05/02/2022 60  % Final   Neutro Abs 05/02/2022 5.9  1.7 - 7.7 K/uL Final   Lymphocytes Relative 05/02/2022 33  % Final   Lymphs Abs 05/02/2022 3.2  0.7 - 4.0 K/uL Final   Monocytes Relative 05/02/2022 5  % Final   Monocytes Absolute 05/02/2022 0.5  0.1 - 1.0 K/uL Final   Eosinophils Relative 05/02/2022 1  % Final   Eosinophils Absolute 05/02/2022 0.1  0.0 - 0.5 K/uL Final   Basophils Relative 05/02/2022 1  % Final   Basophils Absolute 05/02/2022 0.1  0.0 - 0.1 K/uL Final   Immature Granulocytes 05/02/2022 0  % Final   Abs Immature Granulocytes  05/02/2022 0.03  0.00 - 0.07 K/uL Final   Performed at [facility], [address] [place], [place] [address]   I-stat hCG, quantitative 05/02/2022 <5.0  <5 mIU/mL Final   Comment 3 05/02/2022  Final   Comment:   GEST. AGE      CONC.  (mIU/mL)   <=1 WEEK        5 - 50     2 WEEKS       50 - 500     3 WEEKS       100 - 10,000     4 WEEKS     1,000 - 30,000        FEMALE AND NON-PREGNANT FEMALE:     LESS THAN 5 mIU/mL    SARS Coronavirus 2 by RT PCR 05/02/2022 NEGATIVE  NEGATIVE Final   Comment: (NOTE) SARS-CoV-2 target nucleic acids are NOT DETECTED.  The SARS-CoV-2 RNA is generally detectable in upper and lower respiratory specimens during the acute phase of infection. The lowest concentration of SARS-CoV-2 viral copies this assay can detect is 250 copies / mL. A negative result does not preclude SARS-CoV-2 infection and should not be used as the sole basis for treatment or other patient management decisions.  A negative result may occur with improper specimen collection / handling, submission of specimen other than nasopharyngeal swab, presence of viral mutation(s) within the areas targeted by this assay, and inadequate number of viral copies (<250 copies / mL). A negative result must be combined with clinical observations, patient history, and epidemiological information.  Fact Sheet for Patients:   RoadLapTop.co.za  Fact Sheet for Healthcare Providers: http://kim-miller.com/  This test is not yet approved or                           cleared by the [place] FDA and has been authorized for detection and/or diagnosis of SARS-CoV-2 by FDA under an Emergency Use Authorization (EUA).  This EUA will remain in effect (meaning this test can be used) for the duration of the COVID-19 declaration under Section 564(b)(1) of the Act, 21 U.S.C. section 360bbb-3(b)(1), unless the authorization is terminated  or revoked sooner.  Performed at [facility], [address], [place], [place] [address]   Admission on 04/19/2022, Discharged on 04/30/2022  Component Date Value Ref Range Status   Hgb A1c MFr Bld 04/20/2022 5.2  4.8 - 5.6 % Final   Comment: (NOTE)         Prediabetes: 5.7 - 6.4         Diabetes: >6.4         Glycemic control for adults with diabetes: <7.0    Mean Plasma Glucose 04/20/2022 103  mg/dL Final   Comment: (NOTE) Performed At: [facility] [address] [place], [place] [id number] Jolene Schimke MD [id number]    TSH 04/20/2022 1.838  0.350 - 4.500 uIU/mL Final   Comment: Performed by a 3rd Generation assay with a functional sensitivity of <=0.01 uIU/mL. Performed at [facility], [address], [place], [place] [address]    Cholesterol 04/20/2022 191  0 - 200 mg/dL Final   Triglycerides 78/46/9629 163 (H)  <150 mg/dL Final   HDL 52/84/1324 52  >40 mg/dL Final   Total CHOL/HDL Ratio 04/20/2022 3.7  RATIO Final   VLDL 04/20/2022 33  0 - 40 mg/dL Final   LDL Cholesterol 04/20/2022 106 (H)  0 - 99 mg/dL Final   Comment:        Total Cholesterol/HDL:CHD Risk Coronary Heart Disease Risk Table  Men   Women  1/2 Average Risk   3.4   3.3  Average Risk       5.0   4.4  2 X Average Risk   9.6   7.1  3 X Average Risk  23.4   11.0        Use the calculated Patient Ratio above and the CHD Risk Table to determine the patient's CHD Risk.        ATP III CLASSIFICATION (LDL):  <100     mg/dL   Optimal  865-784  mg/dL   Near or Above                    Optimal  130-159  mg/dL   Borderline  696-295  mg/dL   High  >284     mg/dL   Very High Performed at [facility], [address] [place], [place] [address]   Admission on 04/18/2022, Discharged on 04/19/2022  Component Date Value Ref Range Status   Sodium 04/18/2022 138  135 - 145 mmol/L Final   Potassium 04/18/2022 3.8  3.5 - 5.1  mmol/L Final   Chloride 04/18/2022 112 (H)  98 - 111 mmol/L Final   CO2 04/18/2022 20 (L)  22 - 32 mmol/L Final   Glucose, Bld 04/18/2022 95  70 - 99 mg/dL Final   Glucose reference range applies only to samples taken after fasting for at least 8 hours.   BUN 04/18/2022 10  6 - 20 mg/dL Final   Creatinine, Ser 04/18/2022 0.77  0.44 - 1.00 mg/dL Final   Calcium 02/07/7251 8.8 (L)  8.9 - 10.3 mg/dL Final   Total Protein 66/44/0347 6.8  6.5 - 8.1 g/dL Final   Albumin 42/59/5638 4.2  3.5 - 5.0 g/dL Final   AST 75/64/3329 19  15 - 41 U/L Final   ALT 04/18/2022 15  0 - 44 U/L Final   Alkaline Phosphatase 04/18/2022 61  38 - 126 U/L Final   Total Bilirubin 04/18/2022 0.5  0.3 - 1.2 mg/dL Final   GFR, Estimated 04/18/2022 >60  >60 mL/min Final   Comment: (NOTE) Calculated using the CKD-EPI Creatinine Equation (2021)    Anion gap 04/18/2022 6  5 - 15 Final   Performed at [facility], [address], [place], [place] [address]   Alcohol, Ethyl (B) 04/18/2022 <10  <10 mg/dL Final   Comment: (NOTE) Lowest detectable limit for serum alcohol is 10 mg/dL.  For medical purposes only. Performed at [facility], [address] [place], [place] [address]    Salicylate Lvl 04/18/2022 <7.0 (L)  7.0 - 30.0 mg/dL Final   Performed at [facility], [address], [place], [place] [address]   Acetaminophen (Tylenol), Serum 04/18/2022 <10 (L)  10 - 30 ug/mL Final   Comment: (NOTE) Therapeutic concentrations vary significantly. A range of 10-30 ug/mL  may be an effective concentration for many patients. However, some  are best treated at concentrations outside of this range. Acetaminophen concentrations >150 ug/mL at 4 hours after ingestion  and >50 ug/mL at 12 hours after ingestion are often associated with  toxic reactions.  Performed at [facility], [address] [place], [place] [address]    WBC 04/18/2022 9.1  4.0 -  10.5 K/uL Final   RBC 04/18/2022 4.78  3.87 - 5.11 MIL/uL Final   Hemoglobin 04/18/2022 12.2  12.0 - 15.0 g/dL Final   HCT 35/57/3220  39.6  36.0 - 46.0 % Final   MCV 04/18/2022 82.8  80.0 - 100.0 fL Final   MCH 04/18/2022 25.5 (L)  26.0 - 34.0 pg Final   MCHC 04/18/2022 30.8  30.0 - 36.0 g/dL Final   RDW 16/11/9602 14.6  11.5 - 15.5 % Final   Platelets 04/18/2022 237  150 - 400 K/uL Final   nRBC 04/18/2022 0.0  0.0 - 0.2 % Final   Performed at [facility], [address], [place], [place] [address]   Opiates 04/18/2022 NONE DETECTED  NONE DETECTED Final   Cocaine 04/18/2022 NONE DETECTED  NONE DETECTED Final   Benzodiazepines 04/18/2022 NONE DETECTED  NONE DETECTED Final   Amphetamines 04/18/2022 NONE DETECTED  NONE DETECTED Final   Tetrahydrocannabinol 04/18/2022 NONE DETECTED  NONE DETECTED Final   Barbiturates 04/18/2022 NONE DETECTED  NONE DETECTED Final   Comment: (NOTE) DRUG SCREEN FOR MEDICAL PURPOSES ONLY.  IF CONFIRMATION IS NEEDED FOR ANY PURPOSE, NOTIFY LAB WITHIN 5 DAYS.  LOWEST DETECTABLE LIMITS FOR URINE DRUG SCREEN Drug Class                     Cutoff (ng/mL) Amphetamine and metabolites    1000 Barbiturate and metabolites    200 Benzodiazepine                 200 Opiates and metabolites        300 Cocaine and metabolites        300 THC                            50 Performed at [facility], [address], [place], [place] [address]    Preg Test, Ur 04/18/2022 NEGATIVE  NEGATIVE Final   Comment:        THE SENSITIVITY OF THIS METHODOLOGY IS >20 mIU/mL. Performed at [facility], [address] [place], [place] [address]    SARS Coronavirus 2 by RT PCR 04/19/2022 NEGATIVE  NEGATIVE Final   Comment: (NOTE) SARS-CoV-2 target nucleic acids are NOT DETECTED.  The SARS-CoV-2 RNA is generally detectable in upper respiratory specimens during the acute phase of infection. The lowest concentration of  SARS-CoV-2 viral copies this assay can detect is 138 copies/mL. A negative result does not preclude SARS-Cov-2 infection and should not be used as the sole basis for treatment or other patient management decisions. A negative result may occur with  improper specimen collection/handling, submission of specimen other than nasopharyngeal swab, presence of viral mutation(s) within the areas targeted by this assay, and inadequate number of viral copies(<138 copies/mL). A negative result must be combined with clinical observations, patient history, and epidemiological information. The expected result is Negative.  Fact Sheet for Patients:  BloggerCourse.com  Fact Sheet for Healthcare Providers:  SeriousBroker.it  This test is no                          t yet approved or cleared by the [place] FDA and  has been authorized for detection and/or diagnosis of SARS-CoV-2 by FDA under an Emergency Use Authorization (EUA). This EUA will remain  in effect (meaning this test can be used) for the duration of the COVID-19 declaration under Section 564(b)(1) of the Act, 21 U.S.C.section 360bbb-3(b)(1), unless the authorization is terminated  or revoked sooner.       Influenza A by PCR 04/19/2022  NEGATIVE  NEGATIVE Final   Influenza B by PCR 04/19/2022 NEGATIVE  NEGATIVE Final   Comment: (NOTE) The Xpert Xpress SARS-CoV-2/FLU/RSV plus assay is intended as an aid in the diagnosis of influenza from Nasopharyngeal swab specimens and should not be used as a sole basis for treatment. Nasal washings and aspirates are unacceptable for Xpert Xpress SARS-CoV-2/FLU/RSV testing.  Fact Sheet for Patients: BloggerCourse.com  Fact Sheet for Healthcare Providers: SeriousBroker.it  This test is not yet approved or cleared by the [place] FDA and has been authorized for detection and/or diagnosis of  SARS-CoV-2 by FDA under an Emergency Use Authorization (EUA). This EUA will remain in effect (meaning this test can be used) for the duration of the COVID-19 declaration under Section 564(b)(1) of the Act, 21 U.S.C. section 360bbb-3(b)(1), unless the authorization is terminated or revoked.     Resp Syncytial Virus by PCR 04/19/2022 NEGATIVE  NEGATIVE Final   Comment: (NOTE) Fact Sheet for Patients: BloggerCourse.com  Fact Sheet for Healthcare Providers: SeriousBroker.it  This test is not yet approved or cleared by the [place] FDA and has been authorized for detection and/or diagnosis of SARS-CoV-2 by FDA under an Emergency Use Authorization (EUA). This EUA will remain in effect (meaning this test can be used) for the duration of the COVID-19 declaration under Section 564(b)(1) of the Act, 21 U.S.C. section 360bbb-3(b)(1), unless the authorization is terminated or revoked.  Performed at [facility], [address], [place], [place] [address]   Admission on 04/13/2022, Discharged on 04/14/2022  Component Date Value Ref Range Status   Sodium 04/13/2022 140  135 - 145 mmol/L Final   Potassium 04/13/2022 3.6  3.5 - 5.1 mmol/L Final   Chloride 04/13/2022 109  98 - 111 mmol/L Final   CO2 04/13/2022 26  22 - 32 mmol/L Final   Glucose, Bld 04/13/2022 100 (H)  70 - 99 mg/dL Final   Glucose reference range applies only to samples taken after fasting for at least 8 hours.   BUN 04/13/2022 6  6 - 20 mg/dL Final   Creatinine, Ser 04/13/2022 0.75  0.44 - 1.00 mg/dL Final   Calcium 60/45/4098 8.7 (L)  8.9 - 10.3 mg/dL Final   GFR, Estimated 04/13/2022 >60  >60 mL/min Final   Comment: (NOTE) Calculated using the CKD-EPI Creatinine Equation (2021)    Anion gap 04/13/2022 5  5 - 15 Final   Performed at [facility], [address], [place], [place] [address]   WBC 04/13/2022 8.6  4.0 - 10.5 K/uL Final   RBC  04/13/2022 4.36  3.87 - 5.11 MIL/uL Final   Hemoglobin 04/13/2022 11.0 (L)  12.0 - 15.0 g/dL Final   HCT 78/29/5621 36.2  36.0 - 46.0 % Final   MCV 04/13/2022 83.0  80.0 - 100.0 fL Final   MCH 04/13/2022 25.2 (L)  26.0 - 34.0 pg Final   MCHC 04/13/2022 30.4  30.0 - 36.0 g/dL Final   RDW 30/86/5784 14.6  11.5 - 15.5 % Final   Platelets 04/13/2022 247  150 - 400 K/uL Final   nRBC 04/13/2022 0.0  0.0 - 0.2 % Final   Performed at [facility], [address], [place], [place] [address]   Troponin I (High Sensitivity) 04/13/2022 <2  <18 ng/L Final   Comment: (NOTE) Elevated high sensitivity troponin I (hsTnI) values and significant  changes across serial measurements may suggest ACS but many other  chronic and acute conditions are known to elevate  hsTnI results.  Refer to the "Links" section for chest pain algorithms and additional  guidance. Performed at [facility], [address], [place], [place] [address]    I-stat hCG, quantitative 04/13/2022 <5.0  <5 mIU/mL Final   Comment 3 04/13/2022          Final   Comment:   GEST. AGE      CONC.  (mIU/mL)   <=1 WEEK        5 - 50     2 WEEKS       50 - 500     3 WEEKS       100 - 10,000     4 WEEKS     1,000 - 30,000        FEMALE AND NON-PREGNANT FEMALE:     LESS THAN 5 mIU/mL    Troponin I (High Sensitivity) 04/13/2022 <2  <18 ng/L Final   Comment: (NOTE) Elevated high sensitivity troponin I (hsTnI) values and significant  changes across serial measurements may suggest ACS but many other  chronic and acute conditions are known to elevate hsTnI results.  Refer to the "Links" section for chest pain algorithms and additional  guidance. Performed at [facility], [address] [place], [place] [address]   There may be more visits with results that are not included.    Allergies: Bee venom, Coconut flavor, Fish allergy, Geodon [ziprasidone hcl], Haloperidol and related, Lithobid [lithium], Roxicodone [oxycodone],  Seroquel [quetiapine], Shellfish allergy, Phenergan [promethazine hcl], Prilosec [omeprazole], Sulfa antibiotics, Tegretol [carbamazepine], Prozac [fluoxetine], Tape, and Tylenol [acetaminophen]  Medications:  Facility Ordered Medications  Medication   alum & mag hydroxide-simeth (MAALOX/MYLANTA) 200-200-20 MG/5ML suspension 30 mL   magnesium hydroxide (MILK OF MAGNESIA) suspension 30 mL   [COMPLETED] LORazepam (ATIVAN) tablet 1 mg   traZODone (DESYREL) tablet 50 mg   ibuprofen (ADVIL) tablet 200 mg   melatonin tablet 3 mg   OLANZapine (ZYPREXA) tablet 5 mg   pregabalin (LYRICA) capsule 100 mg   prazosin (MINIPRESS) capsule 1 mg   pantoprazole (PROTONIX) EC tablet 40 mg   metoprolol succinate (TOPROL-XL) 24 hr tablet 25 mg   escitalopram (LEXAPRO) tablet 10 mg   vitamin B-12 (CYANOCOBALAMIN) tablet 100 mcg   albuterol (VENTOLIN HFA) 108 (90 Base) MCG/ACT inhaler 2 puff   PTA Medications  Medication Sig   albuterol (VENTOLIN HFA) 108 (90 Base) MCG/ACT inhaler Inhale 2 puffs into the lungs every 6 (six) hours as needed for wheezing or shortness of breath.   pregabalin (LYRICA) 100 MG capsule Take 100 mg by mouth 3 (three) times daily.   BREYNA 160-4.5 MCG/ACT inhaler Inhale 1 puff into the lungs daily.   diclofenac Sodium (VOLTAREN) 1 % GEL Apply 1 Application topically 4 (four) times daily.   hydrOXYzine (ATARAX) 25 MG tablet Take 25 mg twice daily by mouth as needed for anxiety. Take 100 mg by mouth for sleep as needed at bedtime. (Patient taking differently: Take 25-100 mg by mouth See admin instructions. Take one tablet daily as needed for anxiety and four tablets at bedtime as needed for sleep.)   Cyanocobalamin (VITAMIN B-12 PO) Take 1 capsule by mouth at bedtime.   Melatonin 3 MG CAPS Take 3 mg by mouth at bedtime.   ibuprofen (ADVIL) 200 MG tablet Take 200 mg by mouth every 6 (six) hours as needed for moderate pain.    Medical Decision Making  Inpatient observation Meds  ordered this encounter  Medications  alum & mag hydroxide-simeth (MAALOX/MYLANTA) 200-200-20 MG/5ML suspension 30 mL   magnesium hydroxide (MILK OF MAGNESIA) suspension 30 mL   LORazepam (ATIVAN) tablet 1 mg   traZODone (DESYREL) tablet 50 mg   ibuprofen (ADVIL) tablet 200 mg   melatonin tablet 3 mg   OLANZapine (ZYPREXA) tablet 5 mg   pregabalin (LYRICA) capsule 100 mg   prazosin (MINIPRESS) capsule 1 mg   pantoprazole (PROTONIX) EC tablet 40 mg   metoprolol succinate (TOPROL-XL) 24 hr tablet 25 mg   escitalopram (LEXAPRO) tablet 10 mg   vitamin B-12 (CYANOCOBALAMIN) tablet 100 mcg   albuterol (VENTOLIN HFA) 108 (90 Base) MCG/ACT inhaler 2 puff    Lab Orders         SARS Coronavirus 2 by RT PCR (hospital order, performed in [facility] hospital lab) *cepheid single result test* Anterior Nasal Swab         POCT Urine Drug Screen - (I-Screen)         POC urine preg, ED         POC SARS Coronavirus 2 Ag        Recommendations  Based on my evaluation the patient appears to have an emergency medical condition for which I recommend the patient be transferred to the emergency department for further evaluation.  Sindy Guadeloupe, NP 05/16/22  5:47 AM

## 2022-05-15 NOTE — Progress Notes (Signed)
   05/15/22 2245  [facility] Triage Screening (Walk-ins at [facility] only)  How Did You Hear About [name]? Legal System  What Is the Reason for Your Visit/Call Today? Pt presents to [facility] voluntarily, via Patent examiner with complaint of SI with a plan to overdose on medications. Pt also reports HI and feels like she wants to hurt her family, but has no plan or intent. Pt states " I lost my entire support system". Pt reports receiving CST services through [facility] in [place] and she lost her original team members whom she had been working with today. Pt reports that they switched her team without her knowledge because she demanded to be seen 3x a week. Pt also reports hearing voices telling her to kill herself. Pt denies VH and substance/alcohol use.  How Long Has This Been Causing You Problems? 1 wk - 1 month  Have You Recently Had Any Thoughts About Hurting Yourself? Yes  How long ago did you have thoughts about hurting yourself? currently  Are You Planning to Commit Suicide/Harm Yourself At This time? Yes  Have you Recently Had Thoughts About Hurting Someone [name]? Yes  How long ago did you have thoughts of harming others? currently  Are You Planning To Harm Someone At This Time? No  Explanation: Pt reports current suicidal ideation with plan to oversdose on medications. Pt has thoughts of killing her family with no plan or intent  Are you currently experiencing any auditory, visual or other hallucinations? Yes  Please explain the hallucinations you are currently experiencing: hearing voices to kill herself  Have You Used Any Alcohol or Drugs in the Past 24 Hours? No  What Did You Use and How Much? pt denies  Do you have any current medical co-morbidities that require immediate attention? No  Clinician description of patient physical appearance/behavior: pt is calm, cooperative in casual attire with rapid speech at times  What Do You Feel Would Help You the Most Today? Treatment for Depression or other  mood problem;Stress Management;Social Support  If access to [facility] was not available, would you have sought care in the Emergency Department? No  Determination of Need Urgent (48 hours)  Options For Referral Other: Comment;BH Urgent Care;Outpatient Therapy;Medication Management;Inpatient Hospitalization

## 2022-05-16 ENCOUNTER — Encounter (HOSPITAL_COMMUNITY): Payer: Self-pay | Admitting: Registered Nurse

## 2022-05-16 DIAGNOSIS — F439 Reaction to severe stress, unspecified: Secondary | ICD-10-CM | POA: Diagnosis present

## 2022-05-16 DIAGNOSIS — R45851 Suicidal ideations: Secondary | ICD-10-CM

## 2022-05-16 DIAGNOSIS — R4589 Other symptoms and signs involving emotional state: Secondary | ICD-10-CM | POA: Diagnosis not present

## 2022-05-16 DIAGNOSIS — F603 Borderline personality disorder: Secondary | ICD-10-CM

## 2022-05-16 DIAGNOSIS — F4323 Adjustment disorder with mixed anxiety and depressed mood: Secondary | ICD-10-CM

## 2022-05-16 HISTORY — DX: Reaction to severe stress, unspecified: F43.9

## 2022-05-16 LAB — POC SARS CORONAVIRUS 2 AG: SARSCOV2ONAVIRUS 2 AG: NEGATIVE

## 2022-05-16 LAB — SARS CORONAVIRUS 2 BY RT PCR: SARS Coronavirus 2 by RT PCR: NEGATIVE

## 2022-05-16 MED ORDER — VITAMIN B-12 100 MCG PO TABS: 100.0000 ug | ORAL_TABLET | Freq: Every day | ORAL | Status: DC

## 2022-05-16 MED ORDER — PREGABALIN 50 MG PO CAPS
100.0000 mg | ORAL_CAPSULE | Freq: Three times a day (TID) | ORAL | Status: DC
  Filled 2022-05-16: qty 2

## 2022-05-16 MED ORDER — METOPROLOL SUCCINATE ER 25 MG PO TB24
25.0000 mg | ORAL_TABLET | Freq: Every day | ORAL | Status: DC
  Filled 2022-05-16: qty 1

## 2022-05-16 MED ORDER — ALBUTEROL SULFATE HFA 108 (90 BASE) MCG/ACT IN AERS: 2.0000 | INHALATION_SPRAY | Freq: Four times a day (QID) | RESPIRATORY_TRACT | Status: DC | PRN

## 2022-05-16 MED ORDER — OLANZAPINE 5 MG PO TABS
5.0000 mg | ORAL_TABLET | Freq: Every day | ORAL | Status: DC | PRN
  Administered 2022-05-16: 5 mg via ORAL
  Filled 2022-05-16: qty 1

## 2022-05-16 MED ORDER — PRAZOSIN HCL 1 MG PO CAPS: 1.0000 mg | ORAL_CAPSULE | Freq: Every day | ORAL | Status: DC

## 2022-05-16 MED ORDER — PANTOPRAZOLE SODIUM 40 MG PO TBEC
40.0000 mg | DELAYED_RELEASE_TABLET | Freq: Every day | ORAL | Status: DC
  Filled 2022-05-16: qty 1

## 2022-05-16 MED ORDER — ESCITALOPRAM OXALATE 10 MG PO TABS
10.0000 mg | ORAL_TABLET | Freq: Every day | ORAL | Status: DC
  Filled 2022-05-16: qty 1

## 2022-05-16 MED ORDER — METOPROLOL SUCCINATE ER 25 MG PO TB24: 25.0000 mg | ORAL_TABLET | Freq: Every day | ORAL | 0 refills | Status: DC

## 2022-05-16 NOTE — ED Notes (Signed)
Pt A&O x 4,  presents with suicidal ideations, plan to overdose on medications.  Pt also HI towards family members, no plan noted.  Comfort measures given,  Monitoring for safety.

## 2022-05-16 NOTE — Discharge Instructions (Signed)
Keep scheduled appointment with community support team [name]) Safety Plan [name] will reach out to [name] (friend), call 911 or call mobile crisis, or go to nearest emergency room if condition worsens or if suicidal thoughts become active Patients' will follow up with [name] (community support team) and her primary psychiatrist for outpatient psychiatric services (therapy/medication management).  The suicide prevention education provided includes the following: Suicide risk factors Suicide prevention and interventions National Suicide Hotline telephone number [name] assessment telephone number  [name] Emergency Assistance 911 [name] and/or Residential Mobile Crisis Unit telephone number Request made of family/significant other to:  [name] and [name] weapons (e.g., guns, rifles, knives), all items previously/currently identified as safety concern.   Remove drugs/medications (over the counter, prescriptions, illicit drugs), all items previously/currently identified as a safety concern.

## 2022-05-16 NOTE — ED Notes (Signed)
Patient A&Ox4. Patient denies SI/HI and AVH. Patient denies any physical complaints when asked. No acute distress noted. Support and encouragement provided. Routine safety checks conducted according to facility protocol. Encouraged patient to notify staff if thoughts of harm toward self or others arise. Patient verbalize understanding and agreement. Will continue to monitor for safety.    

## 2022-05-16 NOTE — ED Notes (Signed)
Discharge instructions provided and Pt stated understanding. Pt alert, orient and ambulatory prior to d/c from facility. Personal belongings returned from locker number 23. [employer] called for transportation services home. [place]ider waiver signed prior to discharge. Safety maintained.

## 2022-05-16 NOTE — ED Provider Notes (Signed)
FBC/OBS ASAP Discharge Summary  Date and Time: 05/16/2022 11:00 AM  Name: [name]  MRN:  [id number]   Discharge Diagnoses:  Final diagnoses:  Suicidal ideation  Ineffective individual coping  Anxious appearance  Borderline personality disorder  Adjustment disorder with mixed anxiety and depressed mood    Subjective: [name], 32 y/o female with a well-known history of bipolar disorder passive suicide ideation, schizoaffective disorder adjustment disorder, and borderline personality disorder was admitted to continuous assessment unit after [facility] voluntarily via [employer] with complaints of suicidal ideation with plan to overdose her medications.   [name] seen face to face by this provider, consulted with Dr. Nelly RoutArchana Kumar, and chart reviewed on 05/16/22.  On evaluation [name] reports she is feeling better today.  Reports she was upset after hearing that her community support team was being changed.  States she had asked for an increase in visits but was switched to another team.  Reports after thinking about it "I suppose to start work at [employer] on Monday and I wouldn't be able to have 3 visits a week.  I probably need to decrease visits and get my own individual therapist.  I'm suppose to have a meeting with them Friday and we will talk about it."  Patient states that she has only one more day of her medication left in home.  At this time patient denies suicidal/self-harm/homicidal ideations, psychosis, paranoia.  Patient is able to contract for safety.  Patient gave permission to speak to [name] (live in friend).  For collateral information [phone]. During evaluation [name] is sitting up in bed with no noted distress.  She is alert/oriented x 4, calm, cooperative, and attentive.  Her responses were appropriated to assessment questions.  Her mood is euthymic  with congruent affect.  She spoke in a clear tone at moderate  volume, and normal pace, with good eye contact.   She denies suicidal/self-harm/homicidal ideation, psychosis, and paranoia.  Objectively:  there is no evidence of psychosis/mania or delusional thinking.  She conversed coherently, with goal directed thoughts, and no distractibility, or pre-occupation.    Collateral Information:  Spoke to [name] states that a safe has been brought that patients medications can be locked in.  States that he works and patient would need to set up with [facility] to have a nurse give her the medications.  States that patient is suppose to start a job on Monday.  Feels that the patient is safe to come home.    Stay Summary: [name] was admitted for Adjustment disorder with mixed anxiety and depressed mood, crisis management, safety, and stabilization.  Medical problems were identified and treated as needed.  Home medications were restarted, adjusted, or new medications added as needed or appropriate.  Medications treated with during admission are as follows.  escitalopram  10 mg Oral Daily   melatonin  3 mg Oral QHS   metoprolol succinate  25 mg Oral Daily   pantoprazole  40 mg Oral Daily   prazosin  1 mg Oral QHS   pregabalin  100 mg Oral TID   vitamin B-12  100 mcg Oral QHS   Labs ordered for review:  Routine labs: CBC/Diff, CMP, HgB A1c, Lipid Profile, Magnesium, Prolactin, TSH  Lab Orders         SARS Coronavirus 2 by RT PCR (hospital order, performed in [facility] hospital lab) *cepheid single result test* Anterior Nasal Swab  POCT Urine Drug Screen - (I-Screen)         POC urine preg, ED         POC SARS Coronavirus 2 Ag     Improvement was monitored by staff observation and clinical report along with Evelyn Flavors 's verbal report of emotional status and symptom reduction.  Upon completion of this admission Ankita Newcomer was both mentally and medically stable for discharge denying suicidal/homicidal ideation,  auditory/visual/tactile hallucinations, delusional thoughts, and paranoia.    Evelyn Flavors was evaluated by the treatment team for stability and plans for continued recovery upon discharge. Evelyn Flavors 's motivation was an integral factor for scheduling further treatment. Employment, transportation, bed availability, health status, family support, and any pending legal issues were also considered during stay. She was offered further treatment options upon discharge including but not limited to Residential, Intensive Outpatient, and Outpatient treatment.   Evelyn Flavors will follow up with    Discharge Instructions      Keep scheduled appointment with community support team [facility]) Safety Plan Ernesto Lashway will reach out to [name] (friend), call 911 or call mobile crisis, or go to nearest emergency room if condition worsens or if suicidal thoughts become active Patients' will follow up with [facility] (community support team) and her primary psychiatrist for outpatient psychiatric services (therapy/medication management).  The suicide prevention education provided includes the following: Suicide risk factors Suicide prevention and interventions National Suicide Hotline telephone number [facility] assessment telephone number [facility] Emergency Assistance 911 [facility] and/or Residential Mobile Crisis Unit telephone number Request made of family/significant other to:  [name] and [name] weapons (e.g., guns, rifles, knives), all items previously/currently identified as safety concern.   Remove drugs/medications (over the counter, prescriptions, illicit drugs), all items previously/currently identified as a safety concern.    Total Time spent with patient: 30 minutes  Past Psychiatric History: bipolar disorder passive suicide ideation, schizoaffective disorder adjustment disorder, and borderline personality  Past Medical  History:  Past Medical History:  Diagnosis Date   Acid reflux    Anxiety    Asthma    last attack 03/13/15 or 03/14/15   Autism    Carrier of fragile X syndrome    Chronic constipation    Depression    Drug-seeking behavior    Essential tremor    Headache    Overdose of acetaminophen 07/2017   and other meds   Personality disorder    Schizo-affective psychosis    Schizoaffective disorder, bipolar type    Seizures    Last seizure December 2017   Sleep apnea     Family History:  Family History  Problem Relation Age of Onset   Mental illness Father    Asthma Father    PDD Brother    Seizures Brother     Family Psychiatric History: See above Social History:  Social History   Tobacco Use   Smoking status: Former    Packs/day: 0    Types: Cigarettes   Smokeless tobacco: Never   Tobacco comments:    Smoked for 2  years age [age]  Vaping Use   Vaping Use: Never used  Substance Use Topics   Alcohol use: No    Alcohol/week: 1.0 standard drink of alcohol    Types: 1 Standard drinks or equivalent per week    Comment: denies at this time   Drug use: No    Comment: History of cocaine use at age 77  for 4 months    Tobacco Cessation:  A prescription for an FDA-approved tobacco cessation medication was offered at discharge and the patient refused  Current Medications:  Current Facility-Administered Medications  Medication Dose Route Frequency Provider Last Rate Last Admin   albuterol (VENTOLIN HFA) 108 (90 Base) MCG/ACT inhaler 2 puff  2 puff Inhalation Q6H PRN Sindy Guadeloupe, NP       alum & mag hydroxide-simeth (MAALOX/MYLANTA) 200-200-20 MG/5ML suspension 30 mL  30 mL Oral Q4H PRN Sindy Guadeloupe, NP       escitalopram (LEXAPRO) tablet 10 mg  10 mg Oral Daily Sindy Guadeloupe, NP       ibuprofen (ADVIL) tablet 200 mg  200 mg Oral Q6H PRN Sindy Guadeloupe, NP   200 mg at 05/16/22 0022   magnesium hydroxide (MILK OF MAGNESIA) suspension 30 mL  30 mL Oral Daily PRN Sindy Guadeloupe, NP        melatonin tablet 3 mg  3 mg Oral QHS Sindy Guadeloupe, NP       metoprolol succinate (TOPROL-XL) 24 hr tablet 25 mg  25 mg Oral Daily Sindy Guadeloupe, NP       OLANZapine (ZYPREXA) tablet 5 mg  5 mg Oral Daily PRN Sindy Guadeloupe, NP   5 mg at 05/16/22 0022   pantoprazole (PROTONIX) EC tablet 40 mg  40 mg Oral Daily Sindy Guadeloupe, NP       prazosin (MINIPRESS) capsule 1 mg  1 mg Oral QHS Sindy Guadeloupe, NP       pregabalin (LYRICA) capsule 100 mg  100 mg Oral TID Sindy Guadeloupe, NP       traZODone (DESYREL) tablet 50 mg  50 mg Oral QHS PRN Sindy Guadeloupe, NP   50 mg at 05/16/22 0022   vitamin B-12 (CYANOCOBALAMIN) tablet 100 mcg  100 mcg Oral QHS Sindy Guadeloupe, NP       Current Outpatient Medications  Medication Sig Dispense Refill   albuterol (VENTOLIN HFA) 108 (90 Base) MCG/ACT inhaler Inhale 2 puffs into the lungs every 6 (six) hours as needed for wheezing or shortness of breath.     BREYNA 160-4.5 MCG/ACT inhaler Inhale 1 puff into the lungs daily.     CORLANOR 5 MG TABS tablet Take 5 mg by mouth 2 (two) times daily.     Cyanocobalamin (VITAMIN B-12 PO) Take 1 capsule by mouth at bedtime.     cyclobenzaprine (FLEXERIL) 10 MG tablet Take 10 mg by mouth at bedtime.     diclofenac Sodium (VOLTAREN) 1 % GEL Apply 1 Application topically 4 (four) times daily.     escitalopram (LEXAPRO) 10 MG tablet Take 1 tablet (10 mg total) by mouth daily for 14 days. 14 tablet 0   hydrOXYzine (ATARAX) 25 MG tablet Take 25 mg twice daily by mouth as needed for anxiety. Take 100 mg by mouth for sleep as needed at bedtime. (Patient taking differently: Take 25-100 mg by mouth See admin instructions. Take one tablet daily as needed for anxiety and four tablets at bedtime as needed for sleep.) 7 tablet 0   ibuprofen (ADVIL) 200 MG tablet Take 200 mg by mouth every 6 (six) hours as needed for moderate pain.     Melatonin 3 MG CAPS Take 3 mg by mouth at bedtime.     OLANZapine (ZYPREXA) 5 MG tablet Take 1 tablet (5 mg  total) by mouth daily as needed for up to 7 days (anxiety). (Patient taking differently: Take 5 mg by mouth at bedtime.) 7  tablet 0   pantoprazole (PROTONIX) 40 MG tablet Take 1 tablet (40 mg total) by mouth daily for 14 days. 14 tablet 0   prazosin (MINIPRESS) 1 MG capsule Take 1 capsule (1 mg total) by mouth at bedtime for 14 days. 14 capsule 0   pregabalin (LYRICA) 100 MG capsule Take 100 mg by mouth 3 (three) times daily.     traZODone (DESYREL) 50 MG tablet Take 1 tablet (50 mg total) by mouth at bedtime as needed for up to 7 days for sleep. (Patient taking differently: Take 50 mg by mouth at bedtime.) 7 tablet 0   VRAYLAR 4.5 MG CAPS Take 4.5 mg by mouth at bedtime.      PTA Medications:  Facility Ordered Medications  Medication   alum & mag hydroxide-simeth (MAALOX/MYLANTA) 200-200-20 MG/5ML suspension 30 mL   magnesium hydroxide (MILK OF MAGNESIA) suspension 30 mL   [COMPLETED] LORazepam (ATIVAN) tablet 1 mg   traZODone (DESYREL) tablet 50 mg   ibuprofen (ADVIL) tablet 200 mg   melatonin tablet 3 mg   OLANZapine (ZYPREXA) tablet 5 mg   pregabalin (LYRICA) capsule 100 mg   prazosin (MINIPRESS) capsule 1 mg   pantoprazole (PROTONIX) EC tablet 40 mg   metoprolol succinate (TOPROL-XL) 24 hr tablet 25 mg   escitalopram (LEXAPRO) tablet 10 mg   vitamin B-12 (CYANOCOBALAMIN) tablet 100 mcg   albuterol (VENTOLIN HFA) 108 (90 Base) MCG/ACT inhaler 2 puff   PTA Medications  Medication Sig   albuterol (VENTOLIN HFA) 108 (90 Base) MCG/ACT inhaler Inhale 2 puffs into the lungs every 6 (six) hours as needed for wheezing or shortness of breath.   prazosin (MINIPRESS) 1 MG capsule Take 1 capsule (1 mg total) by mouth at bedtime for 14 days.   escitalopram (LEXAPRO) 10 MG tablet Take 1 tablet (10 mg total) by mouth daily for 14 days.   pantoprazole (PROTONIX) 40 MG tablet Take 1 tablet (40 mg total) by mouth daily for 14 days.   pregabalin (LYRICA) 100 MG capsule Take 100 mg by mouth 3 (three)  times daily.   BREYNA 160-4.5 MCG/ACT inhaler Inhale 1 puff into the lungs daily.   diclofenac Sodium (VOLTAREN) 1 % GEL Apply 1 Application topically 4 (four) times daily.   hydrOXYzine (ATARAX) 25 MG tablet Take 25 mg twice daily by mouth as needed for anxiety. Take 100 mg by mouth for sleep as needed at bedtime. (Patient taking differently: Take 25-100 mg by mouth See admin instructions. Take one tablet daily as needed for anxiety and four tablets at bedtime as needed for sleep.)   OLANZapine (ZYPREXA) 5 MG tablet Take 1 tablet (5 mg total) by mouth daily as needed for up to 7 days (anxiety). (Patient taking differently: Take 5 mg by mouth at bedtime.)   traZODone (DESYREL) 50 MG tablet Take 1 tablet (50 mg total) by mouth at bedtime as needed for up to 7 days for sleep. (Patient taking differently: Take 50 mg by mouth at bedtime.)   Cyanocobalamin (VITAMIN B-12 PO) Take 1 capsule by mouth at bedtime.   Melatonin 3 MG CAPS Take 3 mg by mouth at bedtime.   ibuprofen (ADVIL) 200 MG tablet Take 200 mg by mouth every 6 (six) hours as needed for moderate pain.   CORLANOR 5 MG TABS tablet Take 5 mg by mouth 2 (two) times daily.   VRAYLAR 4.5 MG CAPS Take 4.5 mg by mouth at bedtime.   cyclobenzaprine (FLEXERIL) 10 MG tablet Take 10 mg by mouth  at bedtime.       03/10/2022   10:01 PM 03/10/2022    9:56 PM 02/10/2022    1:49 AM  Depression screen PHQ 2/9  Decreased Interest 1 1 2   Down, Depressed, Hopeless 1 1 2   PHQ - 2 Score 2 2 4   Altered sleeping 1 1 2   Tired, decreased energy 1 1 2   Change in appetite 1 0 2  Feeling bad or failure about yourself  1 1 2   Trouble concentrating 1 1 2   Moving slowly or fidgety/restless 1 1 1   Suicidal thoughts 1 1 1   PHQ-9 Score 9 8 16   Difficult doing work/chores Somewhat difficult  Very difficult    Flowsheet Row ED from 05/15/2022 in [facility] Most recent reading at 05/16/2022 12:15 AM ED from 05/13/2022 in [facility] Emergency  Department at [facility] Most recent reading at 05/13/2022  7:44 PM ED from 05/13/2022 in [facility] Emergency Department at [facility] Most recent reading at 05/13/2022 12:55 AM  C-SSRS RISK CATEGORY High Risk Error: Q3, 4, or 5 should not be populated when Q2 is No Error: Question 6 not populated       Musculoskeletal  Strength & Muscle Tone: within normal limits Gait & Station: normal Patient leans: N/A  Psychiatric Specialty Exam  Presentation  General Appearance:  Appropriate for Environment  Eye Contact: Good  Speech: Clear and Coherent; Normal Rate  Speech Volume: Normal (decresed at times)  Handedness: Right   Mood and Affect  Mood: Euphoric  Affect: Appropriate; Congruent   Thought Process  Thought Processes: Coherent; Goal Directed; Linear  Descriptions of Associations:Circumstantial  Orientation:Ward (Time, Place and Person)  Thought Content:WDL  Diagnosis of Schizophrenia or Schizoaffective disorder in past: No  Duration of Psychotic Symptoms: Greater than six months   Hallucinations:Hallucinations: None  Ideas of Reference:None  Suicidal Thoughts:Suicidal Thoughts: No (establining a safety plan on her own) SI Active Intent and/or Plan: With Intent; With Plan SI Passive Intent and/or Plan: With Intent; With Plan  Homicidal Thoughts:Homicidal Thoughts: No   Sensorium  Memory: Immediate Good; Remote Good; Recent Good  Judgment: Fair  Insight: Good   Executive Functions  Concentration: Good  Attention Span: Good  Recall: Good  Fund of Knowledge: Good  Language: Good   Psychomotor Activity  Psychomotor Activity: Psychomotor Activity: Normal   Assets  Assets: Communication Skills; Desire for Improvement; Financial Resources/Insurance; Housing; Resilience; Social Support; Talents/Skills; Transportation; Physical Health   Sleep  Sleep: Sleep: Good Number of Hours of Sleep: 8 (slept 8 hrs last  night)   Nutritional Assessment (For OBS and FBC admissions only) Has the patient had a weight loss or gain of 10 pounds or more in the last 3 months?: No Has the patient had a decrease in food intake/or appetite?: No Does the patient have dental problems?: No Does the patient have eating habits or behaviors that may be indicators of an eating disorder including binging or inducing vomiting?: No Has the patient recently lost weight without trying?: 0 Has the patient been eating poorly because of a decreased appetite?: 0 Malnutrition Screening Tool Score: 0    Physical Exam  Physical Exam HENT:     Head: Normocephalic.     Nose: Nose normal.  Cardiovascular:     Rate and Rhythm: Normal rate.  Pulmonary:     Effort: Pulmonary effort is normal.  Musculoskeletal:        General: Normal range of motion.  Cervical back: Normal range of motion.  Neurological:     General: No focal deficit present.     Mental Status: She is alert.  Psychiatric:        Mood and Affect: Mood normal.        Behavior: Behavior normal.        Thought Content: Thought content normal.        Judgment: Judgment normal.    Review of Systems  Constitutional: Negative.   HENT: Negative.    Eyes: Negative.   Respiratory: Negative.    Cardiovascular: Negative.   Gastrointestinal: Negative.   Genitourinary: Negative.   Musculoskeletal: Negative.   Skin: Negative.   Neurological: Negative.   Psychiatric/Behavioral:  Positive for depression (Denies). Negative for suicidal ideas. The patient is nervous/anxious.    Blood pressure (!) 125/59, pulse (!) 108, temperature 98.4 F (36.9 C), temperature source Oral, resp. rate 17, last menstrual period 04/25/2022, SpO2 100 %. There is no height or weight on file to calculate BMI.  Demographic Factors:  Caucasian  Loss Factors: NA  Historical Factors: Impulsivity  Risk Reduction Factors:   Religious beliefs about death, Living with another person,  especially a relative, and Positive social support  Continued Clinical Symptoms:  Previous Psychiatric Diagnoses and Treatments  Cognitive Features That Contribute To Risk:  None    Suicide Risk:  Minimal: No identifiable suicidal ideation.  Patients presenting with no risk factors but with morbid ruminations; may be classified as minimal risk based on the severity of the depressive symptoms  Plan Of Care/Follow-up recommendations:  Other:  Follow up with current psychiatric provider and community support team   Disposition: No evidence of imminent risk to self or others at present.   Patient does not meet criteria for psychiatric inpatient admission. Supportive therapy provided about ongoing stressors. Discussed crisis plan, support from social network, calling 911, coming to the Emergency Department, and calling Suicide Hotline.  Tomisha Reppucci, NP 05/16/2022, 11:00 AM

## 2022-05-16 NOTE — ED Notes (Signed)
Pt sleeping@this time. Breathing even and unlabored. Will continue to monitor for safety 
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
## 2022-05-16 NOTE — ED Notes (Signed)
Patient resting quietly in bed with eyes closed. Respirations equal and unlabored, skin warm and dry, NAD. Routine safety checks conducted according to facility protocol. Will continue to monitor for safety.  

## 2022-05-17 NOTE — ED Provider Notes (Signed)
[place] EMERGENCY DEPARTMENT AT [facility] Provider Note   CSN: [id number] Arrival date & time: 05/03/22  2244     History  Chief Complaint  Patient presents with   Suicidal    [name] is a 32 y.o. female.  Presents to the emergency department for evaluation of suicidality.  Patient was discharged earlier this morning.  She reports that she got medications and was "out of it" and did not realize she was being discharged.       Home Medications Prior to Admission medications   Medication Sig Start Date End Date Taking? Authorizing Provider  albuterol (VENTOLIN HFA) 108 (90 Base) MCG/ACT inhaler Inhale 2 puffs into the lungs every 6 (six) hours as needed for wheezing or shortness of breath.    [provider]  BREYNA 160-4.5 MCG/ACT inhaler Inhale 1 puff into the lungs daily. 04/18/22   [provider]  CORLANOR 5 MG TABS tablet Take 5 mg by mouth 2 (two) times daily. 05/14/22   [provider]  Cyanocobalamin (VITAMIN B-12 PO) Take 1 capsule by mouth at bedtime.    [provider]  cyclobenzaprine (FLEXERIL) 10 MG tablet Take 10 mg by mouth at bedtime. 05/10/22   [provider]  diclofenac Sodium (VOLTAREN) 1 % GEL Apply 1 Application topically 4 (four) times daily.    [provider]  escitalopram (LEXAPRO) 10 MG tablet Take 1 tablet (10 mg total) by mouth daily for 14 days. 02/07/22 05/16/22  Massengill, Harrold Donath, MD  hydrOXYzine (ATARAX) 25 MG tablet Take 25 mg twice daily by mouth as needed for anxiety. Take 100 mg by mouth for sleep as needed at bedtime. Patient taking differently: Take 25-100 mg by mouth See admin instructions. Take one tablet daily as needed for anxiety and four tablets at bedtime as needed for sleep. 04/30/22   Massengill, Harrold Donath, MD  ibuprofen (ADVIL) 200 MG tablet Take 200 mg by mouth every 6 (six) hours as needed for moderate pain.    [provider]  Melatonin 3 MG CAPS  Take 3 mg by mouth at bedtime.    [provider]  metoprolol succinate (TOPROL-XL) 25 MG 24 hr tablet Take 1 tablet (25 mg total) by mouth daily. 05/17/22   Rankin, Shuvon B, NP  OLANZapine (ZYPREXA) 5 MG tablet Take 1 tablet (5 mg total) by mouth daily as needed for up to 7 days (anxiety). Patient taking differently: Take 5 mg by mouth at bedtime. 04/30/22 05/16/22  Massengill, Harrold Donath, MD  pantoprazole (PROTONIX) 40 MG tablet Take 1 tablet (40 mg total) by mouth daily for 14 days. 02/07/22 05/16/22  Massengill, Harrold Donath, MD  prazosin (MINIPRESS) 1 MG capsule Take 1 capsule (1 mg total) by mouth at bedtime for 14 days. 02/06/22 05/16/22  Massengill, Harrold Donath, MD  pregabalin (LYRICA) 100 MG capsule Take 100 mg by mouth 3 (three) times daily.    [provider]  traZODone (DESYREL) 50 MG tablet Take 1 tablet (50 mg total) by mouth at bedtime as needed for up to 7 days for sleep. Patient taking differently: Take 50 mg by mouth at bedtime. 04/30/22 05/16/22  Massengill, Harrold Donath, MD  VRAYLAR 4.5 MG CAPS Take 4.5 mg by mouth at bedtime. 05/13/22   [provider]      Allergies    Bee venom, Coconut flavor, Fish allergy, Geodon [ziprasidone hcl], Haloperidol and related, Lithobid [lithium], Roxicodone [oxycodone], Seroquel [quetiapine], Shellfish allergy, Phenergan [promethazine hcl], Prilosec [omeprazole], Sulfa antibiotics, Tegretol [carbamazepine], Prozac [  fluoxetine], Tape, and Tylenol [acetaminophen]    Review of Systems   Review of Systems  Physical Exam Updated Vital Signs BP 111/75   Pulse 77   Temp 98.3 F (36.8 C)   Resp 16   LMP 04/25/2022 (Exact Date)   SpO2 98%  Physical Exam Vitals and nursing note reviewed.  Constitutional:      General: She is not in acute distress.    Appearance: She is well-developed.  HENT:     Head: Normocephalic and atraumatic.     Mouth/Throat:     Mouth: Mucous membranes are moist.  Eyes:     General: Vision grossly intact. Gaze aligned  appropriately.     Extraocular Movements: Extraocular movements intact.     Conjunctiva/sclera: Conjunctivae normal.  Cardiovascular:     Rate and Rhythm: Normal rate and regular rhythm.     Pulses: Normal pulses.     Heart sounds: Normal heart sounds, S1 normal and S2 normal. No murmur heard.    No friction rub. No gallop.  Pulmonary:     Effort: Pulmonary effort is normal. No respiratory distress.     Breath sounds: Normal breath sounds.  Abdominal:     General: Bowel sounds are normal.     Palpations: Abdomen is soft.     Tenderness: There is no abdominal tenderness. There is no guarding or rebound.     Hernia: No hernia is present.  Musculoskeletal:        General: No swelling.     Cervical back: Full passive range of motion without pain, normal range of motion and neck supple. No spinous process tenderness or muscular tenderness. Normal range of motion.     Right lower leg: No edema.     Left lower leg: No edema.  Skin:    General: Skin is warm and dry.     Capillary Refill: Capillary refill takes less than 2 seconds.     Findings: No ecchymosis, erythema, rash or wound.  Neurological:     General: No focal deficit present.     Mental Status: She is alert and oriented to person, place, and time.     GCS: GCS eye subscore is 4. GCS verbal subscore is 5. GCS motor subscore is 6.     Cranial Nerves: Cranial nerves 2-12 are intact.     Sensory: Sensation is intact.     Motor: Motor function is intact.     Coordination: Coordination is intact.  Psychiatric:        Attention and Perception: Attention normal.        Mood and Affect: Mood is depressed.        Speech: Speech normal.        Behavior: Behavior normal.        Thought Content: Thought content includes suicidal ideation. Thought content includes suicidal plan.     ED Results / Procedures / Treatments   Labs (all labs ordered are listed, but only abnormal results are displayed) Labs Reviewed  CBC - Abnormal;  Notable for the following components:      Result Value   MCH 25.6 (*)    All other components within normal limits  COMPREHENSIVE METABOLIC PANEL - Abnormal; Notable for the following components:   Potassium 3.4 (*)    Glucose, Bld 103 (*)    Calcium 8.8 (*)    All other components within normal limits  RESP PANEL BY RT-PCR (RSV, FLU A&B, COVID)  RVPGX2  RAPID URINE DRUG  SCREEN, HOSP PERFORMED  ETHANOL  I-STAT BETA HCG BLOOD, ED ([facility], [facility], [facility] ONLY)    EKG None  Radiology No results found.  Procedures Procedures    Medications Ordered in ED Medications - No data to display  ED Course/ Medical Decision Making/ A&P                             Medical Decision Making Amount and/or Complexity of Data Reviewed Labs: ordered.   Presents with persistent suicidal ideation.  Will require psychiatric evaluation, once again.        Final Clinical Impression(s) / ED Diagnoses Final diagnoses:  Suicidal ideation    Rx / DC Orders ED Discharge Orders     None         Elena Davia, Canary Brimhristopher J, MD 05/17/22 2353

## 2022-05-19 ENCOUNTER — Encounter (HOSPITAL_COMMUNITY): Payer: Self-pay | Admitting: Emergency Medicine

## 2022-05-19 ENCOUNTER — Other Ambulatory Visit: Payer: Self-pay

## 2022-05-19 ENCOUNTER — Emergency Department (HOSPITAL_COMMUNITY)
Admission: EM | Admit: 2022-05-19 | Discharge: 2022-05-21 | Disposition: A | Discharge status: Home / Self Care | Payer: Medicare PPO | Attending: Emergency Medicine | Admitting: Emergency Medicine

## 2022-05-19 DIAGNOSIS — F840 Autistic disorder: Secondary | ICD-10-CM | POA: Insufficient documentation

## 2022-05-19 DIAGNOSIS — J45909 Unspecified asthma, uncomplicated: Secondary | ICD-10-CM | POA: Diagnosis not present

## 2022-05-19 DIAGNOSIS — R45851 Suicidal ideations: Secondary | ICD-10-CM

## 2022-05-19 DIAGNOSIS — Z1152 Encounter for screening for COVID-19: Secondary | ICD-10-CM | POA: Insufficient documentation

## 2022-05-19 DIAGNOSIS — T50902A Poisoning by unspecified drugs, medicaments and biological substances, intentional self-harm, initial encounter: Secondary | ICD-10-CM | POA: Diagnosis not present

## 2022-05-19 DIAGNOSIS — T40692A Poisoning by other narcotics, intentional self-harm, initial encounter: Secondary | ICD-10-CM | POA: Insufficient documentation

## 2022-05-19 DIAGNOSIS — R4589 Other symptoms and signs involving emotional state: Secondary | ICD-10-CM | POA: Diagnosis present

## 2022-05-19 DIAGNOSIS — Z87891 Personal history of nicotine dependence: Secondary | ICD-10-CM | POA: Insufficient documentation

## 2022-05-19 DIAGNOSIS — R451 Restlessness and agitation: Secondary | ICD-10-CM | POA: Insufficient documentation

## 2022-05-19 LAB — RAPID URINE DRUG SCREEN, HOSP PERFORMED
Amphetamines: NOT DETECTED
Barbiturates: NOT DETECTED
Benzodiazepines: NOT DETECTED
Cocaine: NOT DETECTED
Opiates: NOT DETECTED
Tetrahydrocannabinol: NOT DETECTED

## 2022-05-19 LAB — CBC WITH DIFFERENTIAL/PLATELET
Abs Immature Granulocytes: 0.02 10*3/uL (ref 0.00–0.07)
Basophils Absolute: 0 10*3/uL (ref 0.0–0.1)
Basophils Relative: 0 %
Eosinophils Absolute: 0.1 10*3/uL (ref 0.0–0.5)
Eosinophils Relative: 1 %
HCT: 41.6 % (ref 36.0–46.0)
Hemoglobin: 12.8 g/dL (ref 12.0–15.0)
Immature Granulocytes: 0 %
Lymphocytes Relative: 28 %
Lymphs Abs: 2.8 10*3/uL (ref 0.7–4.0)
MCH: 25 pg — ABNORMAL LOW (ref 26.0–34.0)
MCHC: 30.8 g/dL (ref 30.0–36.0)
MCV: 81.1 fL (ref 80.0–100.0)
Monocytes Absolute: 0.5 10*3/uL (ref 0.1–1.0)
Monocytes Relative: 5 %
Neutro Abs: 6.6 10*3/uL (ref 1.7–7.7)
Neutrophils Relative %: 66 %
Platelets: 328 10*3/uL (ref 150–400)
RBC: 5.13 MIL/uL — ABNORMAL HIGH (ref 3.87–5.11)
RDW: 14.6 % (ref 11.5–15.5)
WBC: 10.1 10*3/uL (ref 4.0–10.5)
nRBC: 0 % (ref 0.0–0.2)

## 2022-05-19 LAB — COMPREHENSIVE METABOLIC PANEL
ALT: 17 U/L (ref 0–44)
AST: 21 U/L (ref 15–41)
Albumin: 3.9 g/dL (ref 3.5–5.0)
Alkaline Phosphatase: 72 U/L (ref 38–126)
Anion gap: 7 (ref 5–15)
BUN: 9 mg/dL (ref 6–20)
CO2: 22 mmol/L (ref 22–32)
Calcium: 8.7 mg/dL — ABNORMAL LOW (ref 8.9–10.3)
Chloride: 107 mmol/L (ref 98–111)
Creatinine, Ser: 0.7 mg/dL (ref 0.44–1.00)
GFR, Estimated: >60 mL/min (ref >60)
Glucose, Bld: 99 mg/dL (ref 70–99)
Potassium: 3.7 mmol/L (ref 3.5–5.1)
Sodium: 136 mmol/L (ref 135–145)
Total Bilirubin: 0.4 mg/dL (ref 0.3–1.2)
Total Protein: 7.3 g/dL (ref 6.5–8.1)

## 2022-05-19 LAB — BASIC METABOLIC PANEL
Anion gap: 4 — ABNORMAL LOW (ref 5–15)
BUN: 8 mg/dL (ref 6–20)
CO2: 23 mmol/L (ref 22–32)
Calcium: 8.3 mg/dL — ABNORMAL LOW (ref 8.9–10.3)
Chloride: 107 mmol/L (ref 98–111)
Creatinine, Ser: 0.79 mg/dL (ref 0.44–1.00)
GFR, Estimated: >60 mL/min (ref >60)
Glucose, Bld: 103 mg/dL — ABNORMAL HIGH (ref 70–99)
Potassium: 3.3 mmol/L — ABNORMAL LOW (ref 3.5–5.1)
Sodium: 134 mmol/L — ABNORMAL LOW (ref 135–145)

## 2022-05-19 LAB — ACETAMINOPHEN LEVEL
Acetaminophen (Tylenol), Serum: <10 ug/mL — ABNORMAL LOW (ref 10–30)
Acetaminophen (Tylenol), Serum: <10 ug/mL — ABNORMAL LOW (ref 10–30)

## 2022-05-19 LAB — ETHANOL: Alcohol, Ethyl (B): <10 mg/dL (ref <10)

## 2022-05-19 LAB — I-STAT BETA HCG BLOOD, ED (MC, WL, AP ONLY): I-stat hCG, quantitative: <5 m[IU]/mL (ref <5)

## 2022-05-19 LAB — MAGNESIUM
Magnesium: 1.9 mg/dL (ref 1.7–2.4)
Magnesium: 2.3 mg/dL (ref 1.7–2.4)

## 2022-05-19 LAB — SALICYLATE LEVEL: Salicylate Lvl: <7 mg/dL — ABNORMAL LOW (ref 7.0–30.0)

## 2022-05-19 MED ORDER — PANTOPRAZOLE SODIUM 40 MG PO TBEC
40.0000 mg | DELAYED_RELEASE_TABLET | Freq: Every day | ORAL | Status: DC
  Administered 2022-05-19 – 2022-05-21 (×3): 40 mg via ORAL
  Filled 2022-05-19 (×3): qty 1

## 2022-05-19 MED ORDER — ALBUTEROL SULFATE HFA 108 (90 BASE) MCG/ACT IN AERS: 2.0000 | INHALATION_SPRAY | Freq: Four times a day (QID) | RESPIRATORY_TRACT | Status: DC | PRN

## 2022-05-19 MED ORDER — PRAZOSIN HCL 1 MG PO CAPS
1.0000 mg | ORAL_CAPSULE | Freq: Every day | ORAL | Status: DC
  Administered 2022-05-19 – 2022-05-20 (×2): 1 mg via ORAL
  Filled 2022-05-19 (×2): qty 1

## 2022-05-19 MED ORDER — CARIPRAZINE HCL 3 MG PO CAPS
4.5000 mg | ORAL_CAPSULE | Freq: Every day | ORAL | Status: DC
  Filled 2022-05-19: qty 1

## 2022-05-19 MED ORDER — ESCITALOPRAM OXALATE 10 MG PO TABS
10.0000 mg | ORAL_TABLET | Freq: Every day | ORAL | Status: DC
  Administered 2022-05-19 – 2022-05-21 (×3): 10 mg via ORAL
  Filled 2022-05-19 (×3): qty 1

## 2022-05-19 MED ORDER — MELATONIN 3 MG PO TABS
3.0000 mg | ORAL_TABLET | Freq: Every day | ORAL | Status: DC
  Administered 2022-05-19 – 2022-05-20 (×2): 3 mg via ORAL
  Filled 2022-05-19 (×2): qty 1

## 2022-05-19 MED ORDER — IBUPROFEN 200 MG PO TABS: 600.0000 mg | ORAL_TABLET | Freq: Three times a day (TID) | ORAL | Status: DC | PRN

## 2022-05-19 MED ORDER — PREGABALIN 50 MG PO CAPS
100.0000 mg | ORAL_CAPSULE | Freq: Three times a day (TID) | ORAL | Status: DC
  Administered 2022-05-19 – 2022-05-21 (×6): 100 mg via ORAL
  Filled 2022-05-19 (×6): qty 2

## 2022-05-19 MED ORDER — CARIPRAZINE HCL 1.5 MG PO CAPS
4.5000 mg | ORAL_CAPSULE | Freq: Every day | ORAL | Status: DC
  Administered 2022-05-19 – 2022-05-21 (×3): 4.5 mg via ORAL
  Filled 2022-05-19 (×3): qty 3

## 2022-05-19 MED ORDER — PREGABALIN 50 MG PO CAPS: 100.0000 mg | ORAL_CAPSULE | Freq: Three times a day (TID) | ORAL | Status: DC

## 2022-05-19 MED ORDER — POTASSIUM CHLORIDE CRYS ER 20 MEQ PO TBCR
40.0000 meq | EXTENDED_RELEASE_TABLET | Freq: Once | ORAL | Status: AC
  Administered 2022-05-19: 40 meq via ORAL
  Filled 2022-05-19: qty 2

## 2022-05-19 MED ORDER — CARIPRAZINE HCL 4.5 MG PO CAPS: 4.5000 mg | ORAL_CAPSULE | Freq: Every morning | ORAL | Status: DC

## 2022-05-19 MED ORDER — DOCUSATE SODIUM 100 MG PO CAPS
100.0000 mg | ORAL_CAPSULE | Freq: Once | ORAL | Status: AC
  Administered 2022-05-19: 100 mg via ORAL
  Filled 2022-05-19: qty 1

## 2022-05-19 MED ORDER — IBUPROFEN 200 MG PO TABS: 200.0000 mg | ORAL_TABLET | Freq: Four times a day (QID) | ORAL | Status: DC | PRN

## 2022-05-19 MED ORDER — OLANZAPINE 5 MG PO TABS
5.0000 mg | ORAL_TABLET | Freq: Every day | ORAL | Status: DC
  Administered 2022-05-19 – 2022-05-20 (×2): 5 mg via ORAL
  Filled 2022-05-19 (×2): qty 1

## 2022-05-19 MED ORDER — CYCLOBENZAPRINE HCL 10 MG PO TABS
10.0000 mg | ORAL_TABLET | Freq: Every day | ORAL | Status: DC
  Administered 2022-05-19 – 2022-05-20 (×2): 10 mg via ORAL
  Filled 2022-05-19 (×2): qty 1

## 2022-05-19 MED ORDER — MAGNESIUM SULFATE IN D5W 1-5 GM/100ML-% IV SOLN
1.0000 g | Freq: Once | INTRAVENOUS | Status: AC
  Administered 2022-05-19: 1 g via INTRAVENOUS
  Filled 2022-05-19: qty 100

## 2022-05-19 MED ORDER — LORAZEPAM 1 MG PO TABS
1.0000 mg | ORAL_TABLET | Freq: Once | ORAL | Status: AC
  Administered 2022-05-19: 1 mg via ORAL
  Filled 2022-05-19: qty 1

## 2022-05-19 MED ORDER — TRAZODONE HCL 50 MG PO TABS
50.0000 mg | ORAL_TABLET | Freq: Every day | ORAL | Status: DC
  Administered 2022-05-19 – 2022-05-20 (×2): 50 mg via ORAL
  Filled 2022-05-19 (×2): qty 1

## 2022-05-19 MED ORDER — HYDROXYZINE HCL 25 MG PO TABS
25.0000 mg | ORAL_TABLET | Freq: Every day | ORAL | Status: DC
  Administered 2022-05-19 – 2022-05-21 (×3): 25 mg via ORAL
  Filled 2022-05-19 (×3): qty 1

## 2022-05-19 MED ORDER — POTASSIUM CHLORIDE CRYS ER 20 MEQ PO TBCR
20.0000 meq | EXTENDED_RELEASE_TABLET | Freq: Once | ORAL | Status: AC
  Administered 2022-05-19: 20 meq via ORAL
  Filled 2022-05-19: qty 1

## 2022-05-19 NOTE — ED Notes (Signed)
Pt given clean scrubs and items to shower with, sitter assisted.

## 2022-05-19 NOTE — Progress Notes (Signed)
LCSW Progress Note  [id number]   [name]  05/19/2022  4:43 PM    Inpatient Behavioral Health Placement  Pt meets inpatient criteria per, Hillery Jacks, NP. There are no available beds within [facility] system per [facility] Antoinette Cillo,RN. Referral was sent to the following facilities;   Destination  Service Provider Address Phone Fax  [facility]  [address] [place] [address] [phone]  [facility] [address] [place]  [address] [place], [place] [address] [phone]  [facility]  [address] [place] [address] [phone]  [facility] [address]  [address], [place] [address] [phone]  [facility]  [address] [place] [address] [phone]  [facility]  [address] [place], [place] [address] [phone]  [facility]  [address] [place] [address] [phone]  [facility]  [address] [place], [place] [address] [phone]  [facility]  [address] [place] [address] [phone]  [facility]  [address] [place] [address] [phone]  [facility]  [address], [place] [address] [phone]  [facility]  [address] [place] [address] [phone]  [facility]  [address], [place] [address] [phone]  [facility]  [address], [place] [address] [phone]  [facility]  [address] [place] [address] [phone]  [facility]  [address] [place] [address]  [phone]  [facility]  [address], [place] [address] [phone]  [facility]  [address], [place] [address] [phone]    Situation ongoing,  CSW will follow up.    Maryjean Ka, MSW, [facility] 05/19/2022 4:43 PM

## 2022-05-19 NOTE — ED Notes (Signed)
Spoke with Argie Ramming at [facility]. Patty recommends 6hrs observeration on cardiac monitor. Repeat EKG needs to be obtained 5 hours after initial taken.

## 2022-05-19 NOTE — ED Notes (Signed)
Pt making first call of the day.

## 2022-05-19 NOTE — Consult Note (Signed)
[facility] ED ASSESSMENT   Reason for Consult:  Overdose  Referring Physician:  Tonie Griffith  Patient Identification: [name] MRN:  [id number] ED Chief Complaint: Suicidal behavior  Diagnosis:  Principal Problem:   Suicidal behavior Active Problems:   Suicidal ideations   ED Assessment Time Calculation: Start Time: 1210 Stop Time: 1230 Total Time in Minutes (Assessment Completion): 20   Subjective:   [name] is a 32 y.o. female presented to [facility] emergency department due to reported suicide attempt by taking 6 tablets of her prescription medication.  She was seen and evaluated face-to-face.  Continues to endorse suicidal ideations with a plan. " I mean it."  Reports she does not feel that she is being supported by her CST team.  She reports she has been taking her medication as indicated.  Patient is well-known to this service.  Has multiple resources available on outpatient basis.  She reports she does not feel that her medications is helping her mood.  Denied illicit drug use or substance abuse history.  Will recommend inpatient admission.   During evaluation Lashay Osborne is resting in bed. she is alert/oriented x 4; calm/cooperative; and mood congruent with affect.  She is irritable during this assessment.  Patient is speaking in a clear tone at moderate volume, and normal pace; with good eye contact. Her thought process is coherent and relevant; There is no indication that she is currently responding to internal/external stimuli or experiencing delusional thought content.  Patient denies psychosis and paranoia.  Patient has remained calm throughout assessment and has answered questions appropriately.   HPI: Per admission assessment note: "[name] is a 32 y.o. female who presents to the Emergency Department complaining of suicidal.  She states that she took 6 of her 5 mg chlor nor about 1 hour prior to arrival in an attempt to harm  herself.  She reports chronic suicidality.  Her CST team did come out today and told her to use her self coping mechanisms.  She feels like her SI is situational due to stressors in her life.  She took the meds tonight because she wanted the voices to stop and she wants help for what she is going through.  She states she wants to die and be here at the same time.  She does not feel like her issues currently can be helped by medications.  Her goals are to have her daughter back and work full-time.  She has a history of POTS, fibromyalgia, GERD and schizoaffective disorder.  She denies any additional medications that she overdosed on.  She did call for help."   Past Psychiatric History: Charted history with bipolar 1 disorder, borderline personality disorder, suicidal behavior.  Adjustment disorder with mixed emotional conduct.  Currently followed by ACT services.  Where she is prescribed Vraylar and Lyrica and Lexapro.  Risk to Self or Others: Is the patient at risk to self? Yes Has the patient been a risk to self in the past 6 months? Yes Has the patient been a risk to self within the distant past? No Is the patient a risk to others? No Has the patient been a risk to others in the past 6 months? No Has the patient been a risk to others within the distant past? No  [place] Scale:  Flowsheet Row ED from 05/19/2022 in [facility] Emergency Department at [facility] ED from 05/15/2022 in [facility] ED from 05/13/2022 in [facility] Emergency  Department at [facility]  C-SSRS RISK CATEGORY High Risk High Risk Error: Q3, 4, or 5 should not be populated when Q2 is No       AIMS:  , , ,  ,   ASAM:    Substance Abuse:     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 fragile X syndrome    Chronic constipation    Depression    Drug-seeking  behavior    Essential tremor    Headache    Overdose of acetaminophen 07/2017   and other meds   Personality disorder    Schizo-affective psychosis    Schizoaffective disorder, bipolar type    Seizures    Last seizure December 2017   Sleep apnea     Past Surgical History:  Procedure Laterality Date   MOUTH SURGERY  2009 or 2010   Family History:  Family History  Problem Relation Age of Onset   Mental illness Father    Asthma Father    PDD Brother    Seizures Brother    Family Psychiatric  History:  Social History:  Social History   Substance and Sexual Activity  Alcohol Use No   Alcohol/week: 1.0 standard drink of alcohol   Types: 1 Standard drinks or equivalent per week   Comment: denies at this time     Social History   Substance and Sexual Activity  Drug Use No   Comment: History of cocaine use at age 45 for 4 months    Social History   Socioeconomic History   Marital status: Widowed    Spouse name: Not on file   Number of children: 0   Years of education: Not on file   Highest education level: Not on file  Occupational History   Occupation: disability  Tobacco Use   Smoking status: Former    Packs/day: 0    Types: Cigarettes   Smokeless tobacco: Never   Tobacco comments:    Smoked for 2  years age [age]  Vaping Use   Vaping Use: Never used  Substance and Sexual Activity   Alcohol use: No    Alcohol/week: 1.0 standard drink of alcohol    Types: 1 Standard drinks or equivalent per week    Comment: denies at this time   Drug use: No    Comment: History of cocaine use at age 73 for 4 months   Sexual activity: Not Currently    Birth control/protection: None  Other Topics Concern   Not on file  Social History Narrative   Marital status: Widowed      Children: daughter      Lives: with boyfriend, in two story home      Employment:  Disability      Tobacco: quit smoking; smoked for two years.      Alcohol ;none      Drugs: none   Has not  traveled outside of the country.   Right handed         Social Determinants of Health   Financial Resource Strain: Not on file  Food Insecurity: No Food Insecurity (05/06/2022)   Hunger Vital Sign    Worried About Running Out of Food in the Last Year: Never true    Ran Out of Food in the Last Year: Never true  Transportation Needs: Unmet Transportation Needs (05/06/2022)   PRAPARE - Administrator, Civil Service (Medical): Yes    Lack of Transportation (Non-Medical): Yes  Physical Activity: Not on file  Stress: Not on file  Social Connections: Not on file   Additional Social History:    Allergies:   Allergies  Allergen Reactions   Bee Venom Anaphylaxis   Coconut Flavor Anaphylaxis and Rash   Fish Allergy Anaphylaxis   Geodon [Ziprasidone Hcl] Other (See Comments)    Pt states that this medication causes paralysis of the mouth.     Haloperidol And Related Other (See Comments)    Pt states that this medication causes paralysis of the mouth, jaw locks up   Lithobid [Lithium] Other (See Comments)    Seizure-like activity    Roxicodone [Oxycodone] Other (See Comments)    Hallucinations    Seroquel [Quetiapine] Other (See Comments)    Severe drowsiness Pt currently taking 12.5 mg BID, she is ok with this dose   Shellfish Allergy Anaphylaxis   Phenergan [Promethazine Hcl] Other (See Comments)    Chest pain     Prilosec [Omeprazole] Nausea And Vomiting    Pt can take protonix with no problems    Sulfa Antibiotics Other (See Comments)    Chest pain    Tegretol [Carbamazepine] Nausea And Vomiting   Prozac [Fluoxetine] Other (See Comments)    Increased Depression Suicidal thoughts   Tape Other (See Comments)    Skin tears, can only tolerate paper tape.   Tylenol [Acetaminophen] Rash and Other (See Comments)    Rash on face     Labs:  Results for orders placed or performed during the hospital encounter of 05/19/22 (from the past 48 hour(s))  I-Stat beta hCG  blood, ED     Status: None   Collection Time: 05/19/22  3:11 AM  Result Value Ref Range   I-stat hCG, quantitative <5.0 <5 mIU/mL   Comment 3            Comment:   GEST. AGE      CONC.  (mIU/mL)   <=1 WEEK        5 - 50     2 WEEKS       50 - 500     3 WEEKS       100 - 10,000     4 WEEKS     1,000 - 30,000        FEMALE AND NON-PREGNANT FEMALE:     LESS THAN 5 mIU/mL   Comprehensive metabolic panel     Status: Abnormal   Collection Time: 05/19/22  3:13 AM  Result Value Ref Range   Sodium 136 135 - 145 mmol/L   Potassium 3.7 3.5 - 5.1 mmol/L   Chloride 107 98 - 111 mmol/L   CO2 22 22 - 32 mmol/L   Glucose, Bld 99 70 - 99 mg/dL    Comment: Glucose reference range applies only to samples taken after fasting for at least 8 hours.   BUN 9 6 - 20 mg/dL   Creatinine, Ser 9.14 0.44 - 1.00 mg/dL   Calcium 8.7 (L) 8.9 - 10.3 mg/dL   Total Protein 7.3 6.5 - 8.1 g/dL   Albumin 3.9 3.5 - 5.0 g/dL   AST 21 15 - 41 U/L   ALT 17 0 - 44 U/L   Alkaline Phosphatase 72 38 - 126 U/L   Total Bilirubin 0.4 0.3 - 1.2 mg/dL   GFR,  Estimated >60 >60 mL/min    Comment: (NOTE) Calculated using the CKD-EPI Creatinine Equation (2021)    Anion gap 7 5 - 15    Comment: Performed at [facility], [address], [place], [place] [address]  Acetaminophen level     Status: Abnormal   Collection Time: 05/19/22  3:13 AM  Result Value Ref Range   Acetaminophen (Tylenol), Serum <10 (L) 10 - 30 ug/mL    Comment: (NOTE) Therapeutic concentrations vary significantly. A range of 10-30 ug/mL  may be an effective concentration for many patients. However, some  are best treated at concentrations outside of this range. Acetaminophen concentrations >150 ug/mL at 4 hours after ingestion  and >50 ug/mL at 12 hours after ingestion are often associated with  toxic reactions.  Performed at [facility], [address] [place], [place] [address]   Salicylate level     Status:  Abnormal   Collection Time: 05/19/22  3:13 AM  Result Value Ref Range   Salicylate Lvl <7.0 (L) 7.0 - 30.0 mg/dL    Comment: Performed at [facility], [address] [place], [place] [address]  CBC with Differential     Status: Abnormal   Collection Time: 05/19/22  3:13 AM  Result Value Ref Range   WBC 10.1 4.0 - 10.5 K/uL   RBC 5.13 (H) 3.87 - 5.11 MIL/uL   Hemoglobin 12.8 12.0 - 15.0 g/dL   HCT 94.5 85.9 - 29.2 %   MCV 81.1 80.0 - 100.0 fL   MCH 25.0 (L) 26.0 - 34.0 pg   MCHC 30.8 30.0 - 36.0 g/dL   RDW 44.6 28.6 - 38.1 %   Platelets 328 150 - 400 K/uL   nRBC 0.0 0.0 - 0.2 %   Neutrophils Relative % 66 %   Neutro Abs 6.6 1.7 - 7.7 K/uL   Lymphocytes Relative 28 %   Lymphs Abs 2.8 0.7 - 4.0 K/uL   Monocytes Relative 5 %   Monocytes Absolute 0.5 0.1 - 1.0 K/uL   Eosinophils Relative 1 %   Eosinophils Absolute 0.1 0.0 - 0.5 K/uL   Basophils Relative 0 %   Basophils Absolute 0.0 0.0 - 0.1 K/uL   Immature Granulocytes 0 %   Abs Immature Granulocytes 0.02 0.00 - 0.07 K/uL    Comment: Performed at [facility], [address] [name], [place] [address]  Magnesium     Status: None   Collection Time: 05/19/22  3:13 AM  Result Value Ref Range   Magnesium 1.9 1.7 - 2.4 mg/dL    Comment: Performed at [facility], [address] [place], [place] [address]  Ethanol     Status: None   Collection Time: 05/19/22  3:13 AM  Result Value Ref Range   Alcohol, Ethyl (B) <10 <10 mg/dL    Comment: (NOTE) Lowest detectable limit for serum alcohol is 10 mg/dL.  For medical purposes only. Performed at [facility], [address], [place], [place] [address]   Acetaminophen level     Status: Abnormal   Collection Time: 05/19/22  6:01 AM  Result Value Ref Range   Acetaminophen (Tylenol), Serum <10 (L) 10 - 30 ug/mL    Comment: (NOTE) Therapeutic concentrations vary significantly. A range of 10-30 ug/mL  may be  an effective concentration for many patients. However, some  are best treated at concentrations outside of this range. Acetaminophen concentrations >150 ug/mL at 4 hours after ingestion  and >50 ug/mL at 12 hours after ingestion are often associated with  toxic reactions.  Performed at [facility], [address] [place], [place] [address]   Urine rapid drug screen (hosp performed)     Status: None   Collection Time: 05/19/22  7:34 AM  Result Value Ref Range   Opiates NONE DETECTED NONE DETECTED   Cocaine NONE DETECTED NONE DETECTED   Benzodiazepines NONE DETECTED NONE DETECTED   Amphetamines NONE DETECTED NONE DETECTED   Tetrahydrocannabinol NONE DETECTED NONE DETECTED   Barbiturates NONE DETECTED NONE DETECTED    Comment: (NOTE) DRUG SCREEN FOR MEDICAL PURPOSES ONLY.  IF CONFIRMATION IS NEEDED FOR ANY PURPOSE, NOTIFY LAB WITHIN 5 DAYS.  LOWEST DETECTABLE LIMITS FOR URINE DRUG SCREEN Drug Class                     Cutoff (ng/mL) Amphetamine and metabolites    1000 Barbiturate and metabolites    200 Benzodiazepine                 200 Opiates and metabolites        300 Cocaine and metabolites        300 THC                            50 Performed at [facility], [address], [place], [place] [address]   Basic metabolic panel     Status: Abnormal   Collection Time: 05/19/22  9:03 AM  Result Value Ref Range   Sodium 134 (L) 135 - 145 mmol/L   Potassium 3.3 (L) 3.5 - 5.1 mmol/L   Chloride 107 98 - 111 mmol/L   CO2 23 22 - 32 mmol/L   Glucose, Bld 103 (H) 70 - 99 mg/dL    Comment: Glucose reference range applies only to samples taken after fasting for at least 8 hours.   BUN 8 6 - 20 mg/dL   Creatinine, Ser 2.13 0.44 - 1.00 mg/dL   Calcium 8.3 (L) 8.9 - 10.3 mg/dL   GFR, Estimated >08 >65 mL/min    Comment: (NOTE) Calculated using the CKD-EPI Creatinine Equation (2021)    Anion gap 4 (L) 5 - 15    Comment: Performed at [facility], [address], [place], [place] [address]  Magnesium     Status: None   Collection Time: 05/19/22  9:48 AM  Result Value Ref Range   Magnesium 2.3 1.7 - 2.4 mg/dL    Comment: Performed at [facility], [address], [place], [place] [address]    Current Facility-Administered Medications  Medication Dose Route Frequency Provider Last Rate Last Admin   ibuprofen (ADVIL) tablet 600 mg  600 mg Oral Q8H PRN Terald Sleeper, MD       Current Outpatient Medications  Medication Sig Dispense Refill   albuterol (VENTOLIN HFA) 108 (90 Base) MCG/ACT inhaler Inhale 2 puffs into the lungs every 6 (six) hours as needed for wheezing or shortness of breath.     BREYNA 160-4.5 MCG/ACT inhaler Inhale 1 puff into the lungs daily.     CORLANOR 5 MG TABS tablet Take 5 mg by mouth 2 (two) times daily.     Cyanocobalamin (VITAMIN B-12 PO) Take 1 capsule by mouth at bedtime.     cyclobenzaprine (FLEXERIL) 10 MG tablet Take 10 mg by mouth at bedtime.     diclofenac  Sodium (VOLTAREN) 1 % GEL Apply 1 Application topically 4 (four) times daily.     escitalopram (LEXAPRO) 10 MG tablet Take 1 tablet (10 mg total) by mouth daily for 14 days. 14 tablet 0   hydrOXYzine (ATARAX) 25 MG tablet Take 25 mg twice daily by mouth as needed for anxiety. Take 100 mg by mouth for sleep as needed at bedtime. (Patient taking differently: Take 25-100 mg by mouth See admin instructions. Take one tablet daily as needed for anxiety and four tablets at bedtime as needed for sleep.) 7 tablet 0   ibuprofen (ADVIL) 200 MG tablet Take 200 mg by mouth every 6 (six) hours as needed for moderate pain.     Melatonin 3 MG CAPS Take 3 mg by mouth at bedtime.     metoprolol succinate (TOPROL-XL) 25 MG 24 hr tablet Take 1 tablet (25 mg total) by mouth daily. 15 tablet 0   OLANZapine (ZYPREXA) 5 MG tablet Take 1 tablet (5 mg total) by mouth daily as needed for up to 7 days (anxiety). (Patient taking  differently: Take 5 mg by mouth at bedtime.) 7 tablet 0   pantoprazole (PROTONIX) 40 MG tablet Take 1 tablet (40 mg total) by mouth daily for 14 days. 14 tablet 0   prazosin (MINIPRESS) 1 MG capsule Take 1 capsule (1 mg total) by mouth at bedtime for 14 days. 14 capsule 0   pregabalin (LYRICA) 100 MG capsule Take 100 mg by mouth 3 (three) times daily.     traZODone (DESYREL) 50 MG tablet Take 1 tablet (50 mg total) by mouth at bedtime as needed for up to 7 days for sleep. (Patient taking differently: Take 50 mg by mouth at bedtime.) 7 tablet 0   VRAYLAR 4.5 MG CAPS Take 4.5 mg by mouth at bedtime.      Musculoskeletal: Strength & Muscle Tone: within normal limits Gait & Station: normal Patient leans: N/A   Psychiatric Specialty Exam: Presentation  General Appearance:  Appropriate for Environment  Eye Contact: Good  Speech: Clear and Coherent; Normal Rate  Speech Volume: Normal (decresed at times)  Handedness: Right   Mood and Affect  Mood: Euphoric  Affect: Appropriate; Congruent   Thought Process  Thought Processes: Coherent; Goal Directed; Linear  Descriptions of Associations:Circumstantial  Orientation:Full (Time, Place and Person)  Thought Content:WDL  History of Schizophrenia/Schizoaffective disorder:No  Duration of Psychotic Symptoms:Greater than six months  Hallucinations:No data recorded Ideas of Reference:None  Suicidal Thoughts:No data recorded Homicidal Thoughts:No data recorded  Sensorium  Memory: Immediate Good; Remote Good; Recent Good  Judgment: Fair  Insight: Good   Executive Functions  Concentration: Good  Attention Span: Good  Recall: Good  Fund of Knowledge: Good  Language: Good   Psychomotor Activity  Psychomotor Activity:No data recorded  Assets  Assets: Communication Skills; Desire for Improvement; Financial Resources/Insurance; Housing; Resilience; Social Support; Talents/Skills; Transportation;  Physical Health    Sleep  Sleep:No data recorded  Physical Exam: Physical Exam Vitals and nursing note reviewed.  Constitutional:      Appearance: Normal appearance.  Neurological:     Mental Status: She is alert.  Psychiatric:        Mood and Affect: Mood normal.        Thought Content: Thought content normal.    Review of Systems  Constitutional: Negative.   Eyes: Negative.   Respiratory: Negative.    Musculoskeletal: Negative.   Psychiatric/Behavioral:  Positive for depression and suicidal ideas. The patient is nervous/anxious.  Blood pressure 128/70, pulse 69, temperature 97.8 F (36.6 C), temperature source Oral, resp. rate 17, last menstrual period 04/25/2022, SpO2 100 %. There is no height or weight on file to calculate BMI.  Medical Decision Making: Intentional overdose Suicidal ideations  Disposition: Recommend psychiatric Inpatient admission when medically cleared.  Oneta Rack, NP 05/19/2022 12:17 PM

## 2022-05-19 NOTE — ED Provider Notes (Signed)
[place] EMERGENCY DEPARTMENT AT [facility] Provider Note   CSN: [id number] Arrival date & time: 05/19/22  0100     History  Chief Complaint  Patient presents with   Suicidal    [name] is a 32 y.o. female.  The history is provided by the patient and medical records.  [name] is a 32 y.o. female who presents to the Emergency Department complaining of suicidal.  She states that she took 6 of her 5 mg chlor nor about 1 hour prior to arrival in an attempt to harm herself.  She reports chronic suicidality.  Her CST team did come out today and told her to use her self coping mechanisms.  She feels like her SI is situational due to stressors in her life.  She took the meds tonight because she wanted the voices to stop and she wants help for what she is going through.  She states she wants to die and be here at the same time.  She does not feel like her issues currently can be helped by medications.  Her goals are to have her daughter back and work full-time.  She has a history of POTS, fibromyalgia, GERD and schizoaffective disorder.  She denies any additional medications that she overdosed on.  She did call for help.    Home Medications Prior to Admission medications   Medication Sig Start Date End Date Taking? Authorizing Provider  albuterol (VENTOLIN HFA) 108 (90 Base) MCG/ACT inhaler Inhale 2 puffs into the lungs every 6 (six) hours as needed for wheezing or shortness of breath.    [provider]  BREYNA 160-4.5 MCG/ACT inhaler Inhale 1 puff into the lungs daily. 04/18/22   [provider]  CORLANOR 5 MG TABS tablet Take 5 mg by mouth 2 (two) times daily. 05/14/22   [provider]  Cyanocobalamin (VITAMIN B-12 PO) Take 1 capsule by mouth at bedtime.    [provider]  cyclobenzaprine (FLEXERIL) 10 MG tablet Take 10 mg by mouth at bedtime. 05/10/22   [provider]  diclofenac Sodium (VOLTAREN) 1 % GEL  Apply 1 Application topically 4 (four) times daily.    [provider]  escitalopram (LEXAPRO) 10 MG tablet Take 1 tablet (10 mg total) by mouth daily for 14 days. 02/07/22 05/16/22  Massengill, Harrold Donath, MD  hydrOXYzine (ATARAX) 25 MG tablet Take 25 mg twice daily by mouth as needed for anxiety. Take 100 mg by mouth for sleep as needed at bedtime. Patient taking differently: Take 25-100 mg by mouth See admin instructions. Take one tablet daily as needed for anxiety and four tablets at bedtime as needed for sleep. 04/30/22   Massengill, Harrold Donath, MD  ibuprofen (ADVIL) 200 MG tablet Take 200 mg by mouth every 6 (six) hours as needed for moderate pain.    [provider]  Melatonin 3 MG CAPS Take 3 mg by mouth at bedtime.    [provider]  metoprolol succinate (TOPROL-XL) 25 MG 24 hr tablet Take 1 tablet (25 mg total) by mouth daily. 05/17/22   Rankin, Shuvon B, NP  OLANZapine (ZYPREXA) 5 MG tablet Take 1 tablet (5 mg total) by mouth daily as needed for up to 7 days (anxiety). Patient taking differently: Take 5 mg by mouth at bedtime. 04/30/22 05/16/22  Massengill, Harrold Donath, MD  pantoprazole (PROTONIX) 40 MG tablet Take 1 tablet (40 mg total) by mouth daily for 14 days. 02/07/22 05/16/22  Phineas Inches, MD  prazosin (MINIPRESS)  1 MG capsule Take 1 capsule (1 mg total) by mouth at bedtime for 14 days. 02/06/22 05/16/22  Massengill, Harrold Donath, MD  pregabalin (LYRICA) 100 MG capsule Take 100 mg by mouth 3 (three) times daily.    [provider]  traZODone (DESYREL) 50 MG tablet Take 1 tablet (50 mg total) by mouth at bedtime as needed for up to 7 days for sleep. Patient taking differently: Take 50 mg by mouth at bedtime. 04/30/22 05/16/22  Massengill, Harrold Donath, MD  VRAYLAR 4.5 MG CAPS Take 4.5 mg by mouth at bedtime. 05/13/22   [provider]      Allergies    Bee venom, Coconut flavor, Fish allergy, Geodon [ziprasidone hcl], Haloperidol and related, Lithobid [lithium],  Roxicodone [oxycodone], Seroquel [quetiapine], Shellfish allergy, Phenergan [promethazine hcl], Prilosec [omeprazole], Sulfa antibiotics, Tegretol [carbamazepine], Prozac [fluoxetine], Tape, and Tylenol [acetaminophen]    Review of Systems   Review of Systems  All other systems reviewed and are negative.   Physical Exam Updated Vital Signs BP 99/66   Pulse 73   Temp 97.8 F (36.6 C) (Oral)   Resp 13   LMP 04/25/2022 (Exact Date)   SpO2 97%  Physical Exam Vitals and nursing note reviewed.  Constitutional:      Appearance: She is well-developed.  HENT:     Head: Normocephalic and atraumatic.  Cardiovascular:     Rate and Rhythm: Normal rate and regular rhythm.  Pulmonary:     Effort: Pulmonary effort is normal. No respiratory distress.  Musculoskeletal:        General: No tenderness.  Skin:    General: Skin is warm and dry.  Neurological:     Mental Status: She is alert and oriented to person, place, and time.  Psychiatric:     Comments: Rapid speech.  Mildly agitated.  Poor eye contact.       ED Results / Procedures / Treatments   Labs (all labs ordered are listed, but only abnormal results are displayed) Labs Reviewed  COMPREHENSIVE METABOLIC PANEL - Abnormal; Notable for the following components:      Result Value   Calcium 8.7 (*)    All other components within normal limits  ACETAMINOPHEN LEVEL - Abnormal; Notable for the following components:   Acetaminophen (Tylenol), Serum <10 (*)    All other components within normal limits  SALICYLATE LEVEL - Abnormal; Notable for the following components:   Salicylate Lvl <7.0 (*)    All other components within normal limits  CBC WITH DIFFERENTIAL/PLATELET - Abnormal; Notable for the following components:   RBC 5.13 (*)    MCH 25.0 (*)    All other components within normal limits  ACETAMINOPHEN LEVEL - Abnormal; Notable for the following components:   Acetaminophen (Tylenol), Serum <10 (*)    All other components  within normal limits  MAGNESIUM  ETHANOL  RAPID URINE DRUG SCREEN, HOSP PERFORMED  BASIC METABOLIC PANEL  I-STAT BETA HCG BLOOD, ED ([facility], [facility], [facility] ONLY)    EKG EKG Interpretation  Date/Time:  Saturday May 19 2022 05:34:27 EDT Ventricular Rate:  80 PR Interval:  136 QRS Duration: 87 QT Interval:  454 QTC Calculation: 524 R Axis:   93 Text Interpretation: Sinus rhythm Borderline right axis deviation Low voltage, precordial leads Prolonged QT interval Confirmed by Tilden Fossa [phone]) on 05/19/2022 5:56:47 AM  Radiology No results found.  Procedures Procedures    Medications Ordered in ED Medications  magnesium sulfate IVPB 1 g 100 mL (1 g Intravenous New Bag/Given  05/19/22 0651)  LORazepam (ATIVAN) tablet 1 mg (1 mg Oral Given 05/19/22 0302)  potassium chloride SA (KLOR-CON M) CR tablet 20 mEq (20 mEq Oral Given 05/19/22 2863)    ED Course/ Medical Decision Making/ A&P                             Medical Decision Making Amount and/or Complexity of Data Reviewed Labs: ordered.  Risk Prescription drug management.   Patient with long psychiatric history here for evaluation voluntarily following overdose on cloranor with intent to self-harm.  Initial EKG patient does have sinus bradycardia with prolonged QT.  Repeat EKG with improved heart rate but does have ongoing prolonged QT.  She has had intermittent prolonged QT in the past.  Initial recommendation by poison control is for 6-hour observation.  At 5-hour mark Poison control recommends replacing magnesium and goal potassium of 4.0.  Will provide p.o. potassium and IV mag.  Patient is agreeable to receiving treatment.  She is not currently under IVC.  Patient care transferred pending repeat labs followed by psychiatric evaluation.        Final Clinical Impression(s) / ED Diagnoses Final diagnoses:  Suicidal ideation  Intentional overdose, initial encounter    Rx / DC Orders ED Discharge Orders     None          Tilden Fossa, MD 05/19/22 [phone]

## 2022-05-19 NOTE — ED Provider Notes (Signed)
32 yo female here voluntarily with concern for SI Per medication overdose protocol, wanted repeat K and Mg rechecked at 0900, EKG repeated  Physical Exam  BP 99/66   Pulse 73   Temp 97.8 F (36.6 C) (Oral)   Resp 13   LMP 04/25/2022 (Exact Date)   SpO2 97%   Physical Exam  Procedures  Procedures  ED Course / MDM    Medical Decision Making Amount and/or Complexity of Data Reviewed Labs: ordered.  Risk Prescription drug management.   Patient's labs reassessed at 9 AM.  Potassium is 3.4.  Will continue to replete orally but at this time she is stable and medically cleared for psych evaluation.  Her repeat EKG shows normal sinus rhythm with QTc 480, significant prolongation.       Terald Sleeper, MD 05/19/22 [phone]

## 2022-05-19 NOTE — ED Triage Notes (Signed)
BIB [facility] w/ c/o taking 6 corlanor 5mg  and her regular night time meds in attempt to harm herself. VSS

## 2022-05-19 NOTE — ED Notes (Signed)
Patient is asking for daily Vraylar, Lyrica, Protonix and Lexapro RN will ask attending MD

## 2022-05-19 NOTE — ED Notes (Signed)
Pt requested and provided bible

## 2022-05-19 NOTE — ED Notes (Signed)
Pt belongings placed in 19-22 nurse station      One bag bag and 2 white bags

## 2022-05-20 DIAGNOSIS — R45851 Suicidal ideations: Secondary | ICD-10-CM | POA: Diagnosis not present

## 2022-05-20 DIAGNOSIS — R451 Restlessness and agitation: Secondary | ICD-10-CM | POA: Diagnosis not present

## 2022-05-20 MED ORDER — DOCUSATE SODIUM 100 MG PO CAPS
100.0000 mg | ORAL_CAPSULE | Freq: Every day | ORAL | Status: DC | PRN
  Administered 2022-05-20: 100 mg via ORAL
  Filled 2022-05-20: qty 1

## 2022-05-20 NOTE — ED Notes (Addendum)
Patient refusing to go to [facility], patient states the psychiatrist told her if she had a good night she could leave in the AM. No note stating this from psychiatry. Psychiatry contacted. Awaiting response.

## 2022-05-20 NOTE — Progress Notes (Signed)
[facility] LCSW Progress Note   05/20/2022    12:59 PM  [facility]   [id number]   Type of Contact and Topic:  Psychiatric Bed Placement   Pt accepted to [facility]    Patient meets inpatient criteria per Hillery Jacks, NP  The attending provider will be Claris Che, NP  Call report to [phone]  Ilsa Iha, RN @ [facility] ED notified.     Pt scheduled  to arrive at [facility] ANYTIME.    Damita Dunnings, MSW, LCSW-A  1:01 PM 05/20/2022

## 2022-05-20 NOTE — ED Notes (Addendum)
MD IVC'd patient. Voicemail left for transportation. Transport will be tomorrow AM. [facility] called and updated on plan.

## 2022-05-20 NOTE — Progress Notes (Signed)
Patient has been denied by [facility] due to no appropriate beds available. Patient meets [facility] inpatient criteria per Hillery Jacks, NP. Patient has been faxed out to the following facilities:   [facility]  [address], [place] [address] [phone]  [facility] [address] [place]  [address] [place], [place] [address] [phone]  [facility]  [address] [place] [address] [phone]  [facility] [address]  [address] [place] [address] [phone]  [facility]  [address], [place] [address] [phone]  [facility]  [address] [place], [place] [address] [phone]  [facility]  [address] [place] [address] [phone]  [facility]  [address] [place], [place] [address] [phone]  [facility]  [address] [place] [address] [phone]  [facility]  [address] [place] [address] [phone]  [facility]  [address] [place] [address] [phone]  [facility]  [address] [place] [address] [phone]  [facility]  [address] [place] [address] [phone]  [facility]  [address], [place] [address] [phone]  [facility]  [address] [place] [address] [phone]  [facility]  [address] [place] [address] [phone]  [facility]  [address] [place] [address] [phone]  [facility]  [address], [place] [address]  [phone]   Damita Dunnings, MSW, LCSW-A  10:07 AM 05/20/2022

## 2022-05-20 NOTE — ED Provider Notes (Signed)
Emergency Medicine Observation Re-evaluation Note  [name] is a 32 y.o. female, seen on rounds today.  Pt initially presented to the ED for complaints of Suicidal Currently, the patient is resting in bed.  Physical Exam  BP 121/73 (BP Location: Right Arm)   Pulse 93   Temp 98.7 F (37.1 C) (Oral)   Resp 18   LMP 04/25/2022 (Exact Date)   SpO2 98%  Physical Exam General: Resting in bed Cardiac: Regular rate Lungs: Breathing comfortably Psych: Calm not agitated  ED Course / MDM  EKG:EKG Interpretation  Date/Time:  Saturday May 19 2022 09:09:36 EDT Ventricular Rate:  67 PR Interval:  141 QRS Duration: 87 QT Interval:  459 QTC Calculation: 485 R Axis:   62 Text Interpretation: Sinus rhythm Low voltage, precordial leads Minimal ST depression, inferior leads Borderline ST elevation, lateral leads Borderline prolonged QT interval Confirmed by Alvester Chou [phone]) on 05/19/2022 9:57:40 AM  I have reviewed the labs performed to date as well as medications administered while in observation.  Recent changes in the last 24 hours include no acute events.  Plan  Current plan is for psych placement.    Mardene Sayer, MD 05/20/22 [phone]

## 2022-05-20 NOTE — Progress Notes (Signed)
[facility] Psych ED Progress Note  05/20/2022 1:01 PM [name]  MRN:  [id number]   Principal Problem: Suicidal behavior Diagnosis:  Principal Problem:   Suicidal behavior Active Problems:   Suicidal ideations   ED Assessment Time Calculation: Start Time: 1100 Stop Time: 1120 Total Time in Minutes (Assessment Completion): 20   Subjective:  On evaluation today, the patient is sitting on the side of her bed. She is calm and cooperative during this assessment. Her appearance is appropriate for environment. Her eye contact is good.  Speech is clear and coherent, normal pace and normal volume.  She reports her mood is euthymic. Affect is congruent with mood.  Thought process is coherent. Thought content is slightly within normal limits.  She denies auditory and visual hallucinations.  No indication that she is responding to internal stimuli during this assessment.  No delusions elicited during this assessment. She denies homicidal ideations. Patient currently denies SI/HI/AVH, says that her suicidal ideations come and go, due to her situation.  Patient states that her suicide ideations are situational, due to her not being able to have her daughter in her custody, states that she has to get $3000 for custody of her daughter, and $3000 to change visitation for her daughter, patient states when she thinks about that she becomes depressed and does not see a way out, like she will never get her daughter back.  Patient states that she is supposed to see her daughter this weekend but she did not get to see her daughter yesterday due to her mother-in-law giving her the runaround, she states that she has not seen her daughter in about 2 weekends due to her daughter having different recitals, and birthday parties.  Patient states that she just feels hopeless, about the situation of getting her daughter back, she states that they tell her she has to be out of the hospital for so long, has to have a job and  she states it just seems so hard as she thinks about not having her daughter.  Patient states that her appetite and sleep are good.  Patient states that she is trying to work with her community support team and putting them a safety plan of protection, she keeps 7 days of medications and gives her CST team the other medication.  Patient also states that she starts a job at [employer] and [employer] tomorrow. Support, encouragement and reassurance provided about ongoing stressors and patient provided with opportunity for questions.     Past Psychiatric History: Charted history with bipolar 1 disorder, borderline personality disorder, suicidal behavior.  Adjustment disorder with mixed emotional conduct.  Currently followed by ACT services.  Where she is prescribed Vraylar and Lyrica and Lexapro.    [place] Scale:  Flowsheet Row ED from 05/19/2022 in [facility] Emergency Department at [facility] ED from 05/15/2022 in [facility] ED from 05/13/2022 in [facility] Emergency Department at [place]  C-SSRS RISK CATEGORY High Risk High Risk Error: Q3, 4, or 5 should not be populated when Q2 is No       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 fragile X syndrome    Chronic constipation    Depression    Drug-seeking behavior    Essential tremor    Headache  Overdose of acetaminophen 07/2017   and other meds   Personality disorder    Schizo-affective psychosis    Schizoaffective disorder, bipolar type    Seizures    Last seizure December 2017   Sleep apnea     Past Surgical History:  Procedure Laterality Date   MOUTH SURGERY  2009 or 2010   Family History:  Family History  Problem Relation Age of Onset   Mental illness Father    Asthma Father    PDD Brother    Seizures Brother    Social History:  Social History   Substance and Sexual  Activity  Alcohol Use No   Alcohol/week: 1.0 standard drink of alcohol   Types: 1 Standard drinks or equivalent per week   Comment: denies at this time     Social History   Substance and Sexual Activity  Drug Use No   Comment: History of cocaine use at age 19 for 4 months    Social History   Socioeconomic History   Marital status: Widowed    Spouse name: Not on file   Number of children: 0   Years of education: Not on file   Highest education level: Not on file  Occupational History   Occupation: disability  Tobacco Use   Smoking status: Former    Packs/day: 0    Types: Cigarettes   Smokeless tobacco: Never   Tobacco comments:    Smoked for 2  years age [age]  Vaping Use   Vaping Use: Never used  Substance and Sexual Activity   Alcohol use: No    Alcohol/week: 1.0 standard drink of alcohol    Types: 1 Standard drinks or equivalent per week    Comment: denies at this time   Drug use: No    Comment: History of cocaine use at age 47 for 4 months   Sexual activity: Not Currently    Birth control/protection: None  Other Topics Concern   Not on file  Social History Narrative   Marital status: Widowed      Children: daughter      Lives: with boyfriend, in two story home      Employment:  Disability      Tobacco: quit smoking; smoked for two years.      Alcohol ;none      Drugs: none   Has not traveled outside of the country.   Right handed         Social Determinants of Health   Financial Resource Strain: Not on file  Food Insecurity: No Food Insecurity (05/06/2022)   Hunger Vital Sign    Worried About Running Out of Food in the Last Year: Never true    Ran Out of Food in the Last Year: Never true  Transportation Needs: Unmet Transportation Needs (05/06/2022)   PRAPARE - Administrator, Civil Service (Medical): Yes    Lack of Transportation (Non-Medical): Yes  Physical Activity: Not on file  Stress: Not on file  Social Connections: Not on file     Sleep: Good  Appetite:  Good  Current Medications: Current Facility-Administered Medications  Medication Dose Route Frequency Provider Last Rate Last Admin   albuterol (VENTOLIN HFA) 108 (90 Base) MCG/ACT inhaler 2 puff  2 puff Inhalation Q6H PRN Lorre Nick, MD       cariprazine (VRAYLAR) capsule 4.5 mg  4.5 mg Oral Daily Terald Sleeper, MD   4.5 mg at 05/20/22 1004   cyclobenzaprine (FLEXERIL) tablet 10  mg  10 mg Oral QHS Lorre Nick, MD   10 mg at 05/19/22 2138   escitalopram (LEXAPRO) tablet 10 mg  10 mg Oral Daily Lorre Nick, MD   10 mg at 05/20/22 1004   hydrOXYzine (ATARAX) tablet 25 mg  25 mg Oral Daily Lorre Nick, MD   25 mg at 05/20/22 1004   ibuprofen (ADVIL) tablet 200 mg  200 mg Oral Q6H PRN Lorre Nick, MD       melatonin tablet 3 mg  3 mg Oral QHS Lorre Nick, MD   3 mg at 05/19/22 2136   OLANZapine (ZYPREXA) tablet 5 mg  5 mg Oral QHS Lorre Nick, MD   5 mg at 05/19/22 2137   pantoprazole (PROTONIX) EC tablet 40 mg  40 mg Oral Daily Lorre Nick, MD   40 mg at 05/20/22 1004   prazosin (MINIPRESS) capsule 1 mg  1 mg Oral QHS Lorre Nick, MD   1 mg at 05/19/22 2137   pregabalin (LYRICA) capsule 100 mg  100 mg Oral TID Terald Sleeper, MD   100 mg at 05/20/22 1004   traZODone (DESYREL) tablet 50 mg  50 mg Oral QHS Lorre Nick, MD   50 mg at 05/19/22 2137   Current Outpatient Medications  Medication Sig Dispense Refill   albuterol (VENTOLIN HFA) 108 (90 Base) MCG/ACT inhaler Inhale 2 puffs into the lungs every 6 (six) hours as needed for wheezing or shortness of breath.     BREYNA 160-4.5 MCG/ACT inhaler Inhale 1 puff into the lungs in the morning and at bedtime.     CORLANOR 5 MG TABS tablet Take 5 mg by mouth 2 (two) times daily.     Cyanocobalamin (VITAMIN B-12 PO) Take 1 capsule by mouth in the morning.     cyclobenzaprine (FLEXERIL) 10 MG tablet Take 10 mg by mouth at bedtime.     diclofenac Sodium (VOLTAREN) 1 % GEL Apply 1  Application topically 4 (four) times daily.     escitalopram (LEXAPRO) 10 MG tablet Take 1 tablet (10 mg total) by mouth daily for 14 days. 14 tablet 0   hydrOXYzine (ATARAX) 25 MG tablet Take 25 mg twice daily by mouth as needed for anxiety. Take 100 mg by mouth for sleep as needed at bedtime. (Patient taking differently: Take 25 mg by mouth daily. Take one tablet daily as needed for anxiety and four tablets at bedtime as needed for sleep.) 7 tablet 0   ibuprofen (ADVIL) 200 MG tablet Take 200 mg by mouth every 6 (six) hours as needed for moderate pain.     Melatonin 3 MG CAPS Take 3 mg by mouth at bedtime.     OLANZapine (ZYPREXA) 5 MG tablet Take 1 tablet (5 mg total) by mouth daily as needed for up to 7 days (anxiety). (Patient taking differently: Take 5 mg by mouth at bedtime.) 7 tablet 0   pantoprazole (PROTONIX) 40 MG tablet Take 1 tablet (40 mg total) by mouth daily for 14 days. 14 tablet 0   prazosin (MINIPRESS) 1 MG capsule Take 1 capsule (1 mg total) by mouth at bedtime for 14 days. 14 capsule 0   pregabalin (LYRICA) 100 MG capsule Take 100 mg by mouth 3 (three) times daily.     traZODone (DESYREL) 50 MG tablet Take 1 tablet (50 mg total) by mouth at bedtime as needed for up to 7 days for sleep. (Patient taking differently: Take 50 mg by mouth at bedtime.) 7 tablet 0  VRAYLAR 4.5 MG CAPS Take 4.5 mg by mouth in the morning.     metoprolol succinate (TOPROL-XL) 25 MG 24 hr tablet Take 1 tablet (25 mg total) by mouth daily. (Patient not taking: Reported on 05/19/2022) 15 tablet 0    Lab Results:  Results for orders placed or performed during the hospital encounter of 05/19/22 (from the past 48 hour(s))  I-Stat beta hCG blood, ED     Status: None   Collection Time: 05/19/22  3:11 AM  Result Value Ref Range   I-stat hCG, quantitative <5.0 <5 mIU/mL   Comment 3            Comment:   GEST. AGE      CONC.  (mIU/mL)   <=1 WEEK        5 - 50     2 WEEKS       50 - 500     3 WEEKS       100  - 10,000     4 WEEKS     1,000 - 30,000        FEMALE AND NON-PREGNANT FEMALE:     LESS THAN 5 mIU/mL   Comprehensive metabolic panel     Status: Abnormal   Collection Time: 05/19/22  3:13 AM  Result Value Ref Range   Sodium 136 135 - 145 mmol/L   Potassium 3.7 3.5 - 5.1 mmol/L   Chloride 107 98 - 111 mmol/L   CO2 22 22 - 32 mmol/L   Glucose, Bld 99 70 - 99 mg/dL    Comment: Glucose reference range applies only to samples taken after fasting for at least 8 hours.   BUN 9 6 - 20 mg/dL   Creatinine, Ser 5.40 0.44 - 1.00 mg/dL   Calcium 8.7 (L) 8.9 - 10.3 mg/dL   Total Protein 7.3 6.5 - 8.1 g/dL   Albumin 3.9 3.5 - 5.0 g/dL   AST 21 15 - 41 U/L   ALT 17 0 - 44 U/L   Alkaline Phosphatase 72 38 - 126 U/L   Total Bilirubin 0.4 0.3 - 1.2 mg/dL   GFR, Estimated >98 >11 mL/min    Comment: (NOTE) Calculated using the CKD-EPI Creatinine Equation (2021)    Anion gap 7 5 - 15    Comment: Performed at [facility], [address] [place], [place] [address]  Acetaminophen level     Status: Abnormal   Collection Time: 05/19/22  3:13 AM  Result Value Ref Range   Acetaminophen (Tylenol), Serum <10 (L) 10 - 30 ug/mL    Comment: (NOTE) Therapeutic concentrations vary significantly. A range of 10-30 ug/mL  may be an effective concentration for many patients. However, some  are best treated at concentrations outside of this range. Acetaminophen concentrations >150 ug/mL at 4 hours after ingestion  and >50 ug/mL at 12 hours after ingestion are often associated with  toxic reactions.  Performed at [facility], [address] [place], [place] [address]   Salicylate level     Status: Abnormal   Collection Time: 05/19/22  3:13 AM  Result Value Ref Range   Salicylate Lvl <7.0 (L) 7.0 - 30.0 mg/dL    Comment: Performed at [facility], [address] [place], [place] [address]  CBC with Differential     Status: Abnormal   Collection  Time: 05/19/22  3:13 AM  Result Value Ref Range   WBC 10.1 4.0 - 10.5 K/uL  RBC 5.13 (H) 3.87 - 5.11 MIL/uL   Hemoglobin 12.8 12.0 - 15.0 g/dL   HCT 16.1 09.6 - 04.5 %   MCV 81.1 80.0 - 100.0 fL   MCH 25.0 (L) 26.0 - 34.0 pg   MCHC 30.8 30.0 - 36.0 g/dL   RDW 40.9 81.1 - 91.4 %   Platelets 328 150 - 400 K/uL   nRBC 0.0 0.0 - 0.2 %   Neutrophils Relative % 66 %   Neutro Abs 6.6 1.7 - 7.7 K/uL   Lymphocytes Relative 28 %   Lymphs Abs 2.8 0.7 - 4.0 K/uL   Monocytes Relative 5 %   Monocytes Absolute 0.5 0.1 - 1.0 K/uL   Eosinophils Relative 1 %   Eosinophils Absolute 0.1 0.0 - 0.5 K/uL   Basophils Relative 0 %   Basophils Absolute 0.0 0.0 - 0.1 K/uL   Immature Granulocytes 0 %   Abs Immature Granulocytes 0.02 0.00 - 0.07 K/uL    Comment: Performed at [facility], [address], [place], [place] [address]  Magnesium     Status: None   Collection Time: 05/19/22  3:13 AM  Result Value Ref Range   Magnesium 1.9 1.7 - 2.4 mg/dL    Comment: Performed at [facility], [address], [place], [place] [address]  Ethanol     Status: None   Collection Time: 05/19/22  3:13 AM  Result Value Ref Range   Alcohol, Ethyl (B) <10 <10 mg/dL    Comment: (NOTE) Lowest detectable limit for serum alcohol is 10 mg/dL.  For medical purposes only. Performed at [facility], [address] [place], [place] [address]   Acetaminophen level     Status: Abnormal   Collection Time: 05/19/22  6:01 AM  Result Value Ref Range   Acetaminophen (Tylenol), Serum <10 (L) 10 - 30 ug/mL    Comment: (NOTE) Therapeutic concentrations vary significantly. A range of 10-30 ug/mL  may be an effective concentration for many patients. However, some  are best treated at concentrations outside of this range. Acetaminophen concentrations >150 ug/mL at 4 hours after ingestion  and >50 ug/mL at 12 hours after ingestion are often associated with  toxic  reactions.  Performed at [facility], [address], [place], [place] [address]   Urine rapid drug screen (hosp performed)     Status: None   Collection Time: 05/19/22  7:34 AM  Result Value Ref Range   Opiates NONE DETECTED NONE DETECTED   Cocaine NONE DETECTED NONE DETECTED   Benzodiazepines NONE DETECTED NONE DETECTED   Amphetamines NONE DETECTED NONE DETECTED   Tetrahydrocannabinol NONE DETECTED NONE DETECTED   Barbiturates NONE DETECTED NONE DETECTED    Comment: (NOTE) DRUG SCREEN FOR MEDICAL PURPOSES ONLY.  IF CONFIRMATION IS NEEDED FOR ANY PURPOSE, NOTIFY LAB WITHIN 5 DAYS.  LOWEST DETECTABLE LIMITS FOR URINE DRUG SCREEN Drug Class                     Cutoff (ng/mL) Amphetamine and metabolites    1000 Barbiturate and metabolites    200 Benzodiazepine                 200 Opiates and metabolites        300 Cocaine and metabolites        300 THC  50 Performed at [facility], [address] [place], [place] [address]   Basic metabolic panel     Status: Abnormal   Collection Time: 05/19/22  9:03 AM  Result Value Ref Range   Sodium 134 (L) 135 - 145 mmol/L   Potassium 3.3 (L) 3.5 - 5.1 mmol/L   Chloride 107 98 - 111 mmol/L   CO2 23 22 - 32 mmol/L   Glucose, Bld 103 (H) 70 - 99 mg/dL    Comment: Glucose reference range applies only to samples taken after fasting for at least 8 hours.   BUN 8 6 - 20 mg/dL   Creatinine, Ser 1.91 0.44 - 1.00 mg/dL   Calcium 8.3 (L) 8.9 - 10.3 mg/dL   GFR, Estimated >47 >82 mL/min    Comment: (NOTE) Calculated using the CKD-EPI Creatinine Equation (2021)    Anion gap 4 (L) 5 - 15    Comment: Performed at [facility], [address], [place], [place] [address]  Magnesium     Status: None   Collection Time: 05/19/22  9:48 AM  Result Value Ref Range   Magnesium 2.3 1.7 - 2.4 mg/dL    Comment: Performed at [facility], [address], [place], [place] [address]    Blood Alcohol level:  Lab Results  Component Value Date   [facility] <10 05/19/2022   ETH <10 05/06/2022    Physical Findings:  CIWA:    COWS:     Musculoskeletal: Strength & Muscle Tone: within normal limits Gait & Station: normal Patient leans: N/A  Psychiatric Specialty Exam:  Presentation  General Appearance:  Appropriate for Environment  Eye Contact: Good  Speech: Clear and Coherent  Speech Volume: Normal  Handedness: Right   Mood and Affect  Mood: Depressed  Affect: Appropriate   Thought Process  Thought Processes: Coherent  Descriptions of Associations:Intact  Orientation:Full (Time, Place and Person)  Thought Content:WDL  History of Schizophrenia/Schizoaffective disorder:No  Duration of Psychotic Symptoms:N/A  Hallucinations:Hallucinations: None  Ideas of Reference:None  Suicidal Thoughts:Suicidal Thoughts: Yes, Passive  Homicidal Thoughts:Homicidal Thoughts: No   Sensorium  Memory: Immediate Fair; Recent Fair  Judgment: Fair  Insight: Fair   Art therapist  Concentration: Fair  Attention Span: Fair  Recall: Fair  Fund of Knowledge: Good  Language: Good   Psychomotor Activity  Psychomotor Activity: Psychomotor Activity: Normal   Assets  Assets: Communication Skills; Social Support   Sleep  Sleep: Sleep: Fair    Physical Exam: Physical Exam Vitals and nursing note reviewed. Exam conducted with a chaperone present.  Eyes:     Pupils: Pupils are equal, round, and reactive to light.  Pulmonary:     Effort: Pulmonary effort is normal.  Musculoskeletal:     Cervical back: Normal range of motion.  Neurological:     Mental Status: She is alert.  Psychiatric:        Attention and Perception: Attention normal.        Mood and Affect: Mood normal.        Speech: Speech normal.        Behavior: Behavior is cooperative.        Thought Content: Thought  content includes suicidal ideation.        Cognition and Memory: Memory normal.        Judgment: Judgment is impulsive.    Review of Systems  Constitutional: Negative.   HENT: Negative.    Musculoskeletal: Negative.   Psychiatric/Behavioral:  Positive  for depression and suicidal ideas.    Blood pressure 121/73, pulse 93, temperature 98.7 F (37.1 C), temperature source Oral, resp. rate 18, last menstrual period 04/25/2022, SpO2 98 %. There is no height or weight on file to calculate BMI.   Medical Decision Making: Intentional overdose Suicidal ideations Pt has been accepted to [facility] for ANYTIME per [facility] clinical social work team.  Disposition: Recommend psychiatric Inpatient admission when medically cleared.     Schuyler Olden MOTLEY-MANGRUM, PMHNP 05/20/2022, 1:01 PM

## 2022-05-20 NOTE — ED Provider Notes (Signed)
  Physical Exam  BP (!) 119/59 (BP Location: Right Arm)   Pulse 95   Temp 99 F (37.2 C) (Oral)   Resp 18   LMP 04/25/2022 (Exact Date)   SpO2 98%   Physical Exam  Procedures  Procedures  ED Course / MDM    Medical Decision Making Patient apparently wanted to leave.  I IVC patient   Amount and/or Complexity of Data Reviewed Labs: ordered.  Risk OTC drugs. Prescription drug management.          Charlynne Pander, MD 05/20/22 [phone]

## 2022-05-21 DIAGNOSIS — R451 Restlessness and agitation: Secondary | ICD-10-CM | POA: Diagnosis not present

## 2022-05-21 LAB — SARS CORONAVIRUS 2 BY RT PCR: SARS Coronavirus 2 by RT PCR: NEGATIVE

## 2022-05-21 MED ORDER — DICLOFENAC SODIUM 1 % EX GEL
2.0000 g | Freq: Four times a day (QID) | CUTANEOUS | Status: DC | PRN
  Administered 2022-05-21: 2 g via TOPICAL
  Filled 2022-05-21: qty 100

## 2022-05-21 MED ORDER — LORAZEPAM 2 MG/ML IJ SOLN
1.0000 mg | Freq: Once | INTRAMUSCULAR | Status: AC
  Administered 2022-05-21: 1 mg via INTRAMUSCULAR
  Filled 2022-05-21: qty 1

## 2022-05-21 MED ORDER — LORAZEPAM 1 MG PO TABS
1.0000 mg | ORAL_TABLET | Freq: Once | ORAL | Status: AC
  Administered 2022-05-21: 1 mg via ORAL
  Filled 2022-05-21: qty 1

## 2022-05-21 NOTE — Progress Notes (Signed)
CSW received a phone call from [place], intake coordinator, at [facility] at 8:25 AM. Donnal Debar requested covid results and IVC paperwork be faxed to [phone]. CSW notified nursing staff.  Asencion Islam  05/21/2022 8:38 AM

## 2022-05-21 NOTE — ED Notes (Signed)
Per MD order, security at bedside to secure pt in place to attempt to collect sample.

## 2022-05-21 NOTE — ED Notes (Signed)
Specimen obtained, pt increase agitation. Spitting, yelling, and cussing at staff. Unable to calm pt down. MD notified with new orders pending.

## 2022-05-21 NOTE — ED Notes (Addendum)
Writer at bedside to swab pt for required Covid testing. Pt refused multiple times. Stating "Im not doing it, there's no point."

## 2022-05-21 NOTE — ED Provider Notes (Signed)
Emergency Medicine Observation Re-evaluation Note  [name] is a 32 y.o. female, seen on rounds today.  Pt initially presented to the ED for complaints of Suicidal Currently, the patient is resting comfortably.  Physical Exam  BP 121/63 (BP Location: Right Arm)   Pulse 91   Temp 98.8 F (37.1 C) (Oral)   Resp 18   LMP 04/25/2022 (Exact Date)   SpO2 98%  Physical Exam General: NAD   ED Course / MDM  EKG:EKG Interpretation  Date/Time:  Saturday May 19 2022 09:09:36 EDT Ventricular Rate:  67 PR Interval:  141 QRS Duration: 87 QT Interval:  459 QTC Calculation: 485 R Axis:   62 Text Interpretation: Sinus rhythm Low voltage, precordial leads Minimal ST depression, inferior leads Borderline ST elevation, lateral leads Borderline prolonged QT interval Confirmed by Alvester Chou [phone]) on 05/19/2022 9:57:40 AM  I have reviewed the labs performed to date as well as medications administered while in observation.  Recent changes in the last 24 hours include no acute events reported.  Plan  Current plan is for placement.    Wynetta Fines, MD 05/21/22 [phone]

## 2022-05-25 ENCOUNTER — Other Ambulatory Visit: Payer: Self-pay

## 2022-05-25 ENCOUNTER — Encounter (HOSPITAL_COMMUNITY): Payer: Self-pay | Admitting: *Deleted

## 2022-05-25 ENCOUNTER — Emergency Department (HOSPITAL_COMMUNITY)
Admission: EM | Admit: 2022-05-25 | Discharge: 2022-05-28 | Disposition: A | Discharge status: Home / Self Care | Payer: Medicare PPO | Attending: Emergency Medicine | Admitting: Emergency Medicine

## 2022-05-25 DIAGNOSIS — F314 Bipolar disorder, current episode depressed, severe, without psychotic features: Secondary | ICD-10-CM | POA: Insufficient documentation

## 2022-05-25 DIAGNOSIS — R4589 Other symptoms and signs involving emotional state: Secondary | ICD-10-CM | POA: Diagnosis present

## 2022-05-25 DIAGNOSIS — Z20822 Contact with and (suspected) exposure to covid-19: Secondary | ICD-10-CM | POA: Insufficient documentation

## 2022-05-25 DIAGNOSIS — T1491XA Suicide attempt, initial encounter: Secondary | ICD-10-CM

## 2022-05-25 DIAGNOSIS — T50902A Poisoning by unspecified drugs, medicaments and biological substances, intentional self-harm, initial encounter: Secondary | ICD-10-CM | POA: Insufficient documentation

## 2022-05-25 DIAGNOSIS — F603 Borderline personality disorder: Secondary | ICD-10-CM | POA: Diagnosis present

## 2022-05-25 DIAGNOSIS — R457 State of emotional shock and stress, unspecified: Secondary | ICD-10-CM | POA: Diagnosis present

## 2022-05-25 DIAGNOSIS — X838XXA Intentional self-harm by other specified means, initial encounter: Secondary | ICD-10-CM | POA: Diagnosis not present

## 2022-05-25 DIAGNOSIS — F4325 Adjustment disorder with mixed disturbance of emotions and conduct: Secondary | ICD-10-CM | POA: Diagnosis not present

## 2022-05-25 DIAGNOSIS — F419 Anxiety disorder, unspecified: Secondary | ICD-10-CM

## 2022-05-25 LAB — COMPREHENSIVE METABOLIC PANEL
ALT: 15 U/L (ref 0–44)
AST: 18 U/L (ref 15–41)
Albumin: 3.6 g/dL (ref 3.5–5.0)
Alkaline Phosphatase: 72 U/L (ref 38–126)
Anion gap: 9 (ref 5–15)
BUN: 6 mg/dL (ref 6–20)
CO2: 19 mmol/L — ABNORMAL LOW (ref 22–32)
Calcium: 8.8 mg/dL — ABNORMAL LOW (ref 8.9–10.3)
Chloride: 110 mmol/L (ref 98–111)
Creatinine, Ser: 0.77 mg/dL (ref 0.44–1.00)
GFR, Estimated: >60 mL/min (ref >60)
Glucose, Bld: 104 mg/dL — ABNORMAL HIGH (ref 70–99)
Potassium: 3.8 mmol/L (ref 3.5–5.1)
Sodium: 138 mmol/L (ref 135–145)
Total Bilirubin: 0.3 mg/dL (ref 0.3–1.2)
Total Protein: 6.7 g/dL (ref 6.5–8.1)

## 2022-05-25 LAB — RAPID URINE DRUG SCREEN, HOSP PERFORMED
Amphetamines: NOT DETECTED
Barbiturates: NOT DETECTED
Benzodiazepines: NOT DETECTED
Cocaine: NOT DETECTED
Opiates: NOT DETECTED
Tetrahydrocannabinol: NOT DETECTED

## 2022-05-25 LAB — CBC WITH DIFFERENTIAL/PLATELET
Abs Immature Granulocytes: 0.03 10*3/uL (ref 0.00–0.07)
Basophils Absolute: 0 10*3/uL (ref 0.0–0.1)
Basophils Relative: 1 %
Eosinophils Absolute: 0 10*3/uL (ref 0.0–0.5)
Eosinophils Relative: 1 %
HCT: 35.8 % — ABNORMAL LOW (ref 36.0–46.0)
Hemoglobin: 11.1 g/dL — ABNORMAL LOW (ref 12.0–15.0)
Immature Granulocytes: 0 %
Lymphocytes Relative: 33 %
Lymphs Abs: 2.3 10*3/uL (ref 0.7–4.0)
MCH: 25.2 pg — ABNORMAL LOW (ref 26.0–34.0)
MCHC: 31 g/dL (ref 30.0–36.0)
MCV: 81.4 fL (ref 80.0–100.0)
Monocytes Absolute: 0.4 10*3/uL (ref 0.1–1.0)
Monocytes Relative: 6 %
Neutro Abs: 4 10*3/uL (ref 1.7–7.7)
Neutrophils Relative %: 59 %
Platelets: 277 10*3/uL (ref 150–400)
RBC: 4.4 MIL/uL (ref 3.87–5.11)
RDW: 14.5 % (ref 11.5–15.5)
WBC: 6.9 10*3/uL (ref 4.0–10.5)
nRBC: 0 % (ref 0.0–0.2)

## 2022-05-25 LAB — I-STAT BETA HCG BLOOD, ED (MC, WL, AP ONLY): I-stat hCG, quantitative: <5 m[IU]/mL (ref <5)

## 2022-05-25 LAB — ETHANOL: Alcohol, Ethyl (B): <10 mg/dL (ref <10)

## 2022-05-25 LAB — ACETAMINOPHEN LEVEL
Acetaminophen (Tylenol), Serum: <10 ug/mL — ABNORMAL LOW (ref 10–30)
Acetaminophen (Tylenol), Serum: <10 ug/mL — ABNORMAL LOW (ref 10–30)

## 2022-05-25 LAB — SALICYLATE LEVEL: Salicylate Lvl: <7 mg/dL — ABNORMAL LOW (ref 7.0–30.0)

## 2022-05-25 MED ORDER — SODIUM CHLORIDE 0.9 % IV BOLUS
1000.0000 mL | Freq: Once | INTRAVENOUS | Status: AC
  Administered 2022-05-25: 1000 mL via INTRAVENOUS

## 2022-05-25 NOTE — ED Provider Notes (Signed)
[place] EMERGENCY DEPARTMENT AT [facility] Provider Note   CSN: [id number] Arrival date & time: 05/25/22  1723     History  Chief Complaint  Patient presents with   Drug Overdose   Suicide Attempt    [name] is a 32 y.o. female history of schizoaffective, borderline personality, here presenting with suicidal attempt.  Patient was just released from psych facility.  Patient apparently was upset that her child was dated and taken away.  She subsequently took 5 days worth of all her medications (flexeril 10 mg, lexapro 10 mg, atarax 25 mg, toprolol XL 25 mg, zyprexa 5 mg, protonix 40 mg, minipress 1 mg, lyrica 100 mg, trazodone 50 mg, vraylar 4.5 mg).  Apparently she had previous suicidal attempt in the past so she is only given 1 week of medications at a time so she would not overdose too much.  She states that she wants to kill herself again.  She denies any alcohol use  The history is provided by the patient.       Home Medications Prior to Admission medications   Medication Sig Start Date End Date Taking? Authorizing Provider  albuterol (VENTOLIN HFA) 108 (90 Base) MCG/ACT inhaler Inhale 2 puffs into the lungs every 6 (six) hours as needed for wheezing or shortness of breath.    [provider]  BREYNA 160-4.5 MCG/ACT inhaler Inhale 1 puff into the lungs in the morning and at bedtime. 04/18/22   [provider]  CORLANOR 5 MG TABS tablet Take 5 mg by mouth 2 (two) times daily. 05/14/22   [provider]  Cyanocobalamin (VITAMIN B-12 PO) Take 1 capsule by mouth in the morning.    [provider]  cyclobenzaprine (FLEXERIL) 10 MG tablet Take 10 mg by mouth at bedtime. 05/10/22   [provider]  diclofenac Sodium (VOLTAREN) 1 % GEL Apply 1 Application topically 4 (four) times daily.    [provider]  escitalopram (LEXAPRO) 10 MG tablet Take 1 tablet (10 mg total) by mouth daily for 14 days. 02/07/22 05/19/22   Massengill, Harrold Donath, MD  hydrOXYzine (ATARAX) 25 MG tablet Take 25 mg twice daily by mouth as needed for anxiety. Take 100 mg by mouth for sleep as needed at bedtime. Patient taking differently: Take 25 mg by mouth daily. Take one tablet daily as needed for anxiety and four tablets at bedtime as needed for sleep. 04/30/22   Massengill, Harrold Donath, MD  ibuprofen (ADVIL) 200 MG tablet Take 200 mg by mouth every 6 (six) hours as needed for moderate pain.    [provider]  Melatonin 3 MG CAPS Take 3 mg by mouth at bedtime.    [provider]  metoprolol succinate (TOPROL-XL) 25 MG 24 hr tablet Take 1 tablet (25 mg total) by mouth daily. Patient not taking: Reported on 05/19/2022 05/17/22   Rankin, Shuvon B, NP  OLANZapine (ZYPREXA) 5 MG tablet Take 1 tablet (5 mg total) by mouth daily as needed for up to 7 days (anxiety). Patient taking differently: Take 5 mg by mouth at bedtime. 04/30/22 05/19/22  Massengill, Harrold Donath, MD  pantoprazole (PROTONIX) 40 MG tablet Take 1 tablet (40 mg total) by mouth daily for 14 days. 02/07/22 05/19/22  Massengill, Harrold Donath, MD  prazosin (MINIPRESS) 1 MG capsule Take 1 capsule (1 mg total) by mouth at bedtime for 14 days. 02/06/22 05/19/22  Massengill, Harrold Donath, MD  pregabalin (LYRICA) 100 MG capsule Take 100 mg by mouth 3 (three) times daily.  [provider]  traZODone (DESYREL) 50 MG tablet Take 1 tablet (50 mg total) by mouth at bedtime as needed for up to 7 days for sleep. Patient taking differently: Take 50 mg by mouth at bedtime. 04/30/22 05/19/22  Massengill, Harrold Donath, MD  VRAYLAR 4.5 MG CAPS Take 4.5 mg by mouth in the morning. 05/13/22   [provider]      Allergies    Bee venom, Coconut flavor, Fish allergy, Geodon [ziprasidone hcl], Haloperidol and related, Lithobid [lithium], Roxicodone [oxycodone], Seroquel [quetiapine], Shellfish allergy, Phenergan [promethazine hcl], Prilosec [omeprazole], Sulfa antibiotics, Tegretol [carbamazepine], Prozac  [fluoxetine], Tape, and Tylenol [acetaminophen]    Review of Systems   Review of Systems  Psychiatric/Behavioral:  Positive for suicidal ideas.   All other systems reviewed and are negative.   Physical Exam Updated Vital Signs BP 112/70   Pulse 89   Temp 99.2 F (37.3 C) (Oral)   Resp 13   Ht  (1.626 m)   Wt 95.3 kg   LMP 04/25/2022 (Approximate)   SpO2 97%   BMI 36.05 kg/m  Physical Exam Vitals and nursing note reviewed.  Constitutional:      Comments: Tearful but awake  HENT:     Head: Normocephalic.     Nose: Nose normal.  Eyes:     Extraocular Movements: Extraocular movements intact.     Pupils: Pupils are equal, round, and reactive to light.  Cardiovascular:     Rate and Rhythm: Normal rate and regular rhythm.     Pulses: Normal pulses.     Heart sounds: Normal heart sounds.  Pulmonary:     Breath sounds: Normal breath sounds.  Abdominal:     General: Abdomen is flat.     Palpations: Abdomen is soft.  Musculoskeletal:        General: Normal range of motion.     Cervical back: Normal range of motion and neck supple.  Skin:    General: Skin is warm.     Capillary Refill: Capillary refill takes less than 2 seconds.  Neurological:     General: No focal deficit present.     Comments: No focal deficits  Psychiatric:     Comments: Tearful and poor judgment     ED Results / Procedures / Treatments   Labs (all labs ordered are listed, but only abnormal results are displayed) Labs Reviewed  CBC WITH DIFFERENTIAL/PLATELET - Abnormal; Notable for the following components:      Result Value   Hemoglobin 11.1 (*)    HCT 35.8 (*)    MCH 25.2 (*)    All other components within normal limits  COMPREHENSIVE METABOLIC PANEL  ETHANOL  SALICYLATE LEVEL  ACETAMINOPHEN LEVEL  RAPID URINE DRUG SCREEN, HOSP PERFORMED  I-STAT BETA HCG BLOOD, ED ([facility], [facility], [facility] ONLY)    EKG None  Radiology No results found.  Procedures Procedures    Medications Ordered in  ED Medications  sodium chloride 0.9 % bolus 1,000 mL (1,000 mLs Intravenous New Bag/Given 05/25/22 1801)    ED Course/ Medical Decision Making/ A&P                             Medical Decision Making [name] is a 32 y.o. female here presenting with suicidal attempt.  She had previous suicidal and was in the psych hospital recently.  She voluntarily came here after her suicidal attempt.  I called poison control and they recommend  6 to 8 hours of observation and also 4-hour Tylenol level.  11:02 PM Patient observed for 6 hours in the ED.  Patient is slightly sleepy but protecting her airway.  She is never hypoxic.  I reviewed her labs and her repeat Tylenol level is negative.  At this point, patient is medically cleared for psych eval.  I will hold her sedating meds tonight.   Problems Addressed: Suicidal behavior with attempted self-injury: acute illness or injury  Amount and/or Complexity of Data Reviewed Labs: ordered. ECG/medicine tests: ordered.    Final Clinical Impression(s) / ED Diagnoses Final diagnoses:  None    Rx / DC Orders ED Discharge Orders     None         Charlynne Pander, MD 05/25/22 2303

## 2022-05-25 NOTE — ED Notes (Signed)
EDP into see 

## 2022-05-25 NOTE — ED Notes (Signed)
Belongings removed from patient and placed into locker #5. Valuables taken to security, and patient wanded.

## 2022-05-25 NOTE — ED Notes (Signed)
Pt is not ivc for now voluntary consent form attached to the clipboard in green 

## 2022-05-25 NOTE — ED Triage Notes (Signed)
BIB [facility] from home s/p SI attempt drug overdose, Reports took 5 days worth of 10 prescription medications. EMS called by case worker, "attempt triggered by her child be taken away from her by the courts", recent psych admission and d/c from [facility]. VSS. CBG 105. Endorses sadness, dizziness, dry mouth, HA, and sob. Alert, NAD, calm, interactive, resps e/u, speaking in clear complete sentences.

## 2022-05-26 DIAGNOSIS — F314 Bipolar disorder, current episode depressed, severe, without psychotic features: Secondary | ICD-10-CM | POA: Diagnosis not present

## 2022-05-26 MED ORDER — ALBUTEROL SULFATE HFA 108 (90 BASE) MCG/ACT IN AERS: 2.0000 | INHALATION_SPRAY | Freq: Four times a day (QID) | RESPIRATORY_TRACT | Status: DC | PRN

## 2022-05-26 MED ORDER — MELATONIN 3 MG PO TABS
3.0000 mg | ORAL_TABLET | Freq: Every day | ORAL | Status: DC
  Administered 2022-05-26 – 2022-05-27 (×2): 3 mg via ORAL
  Filled 2022-05-26 (×2): qty 1

## 2022-05-26 MED ORDER — OLANZAPINE 5 MG PO TABS
5.0000 mg | ORAL_TABLET | Freq: Every day | ORAL | Status: DC | PRN
  Administered 2022-05-26 – 2022-05-27 (×3): 5 mg via ORAL
  Filled 2022-05-26 (×3): qty 1

## 2022-05-26 MED ORDER — PRAZOSIN HCL 1 MG PO CAPS
1.0000 mg | ORAL_CAPSULE | Freq: Every day | ORAL | Status: DC
  Administered 2022-05-26 – 2022-05-27 (×2): 1 mg via ORAL
  Filled 2022-05-26 (×2): qty 1

## 2022-05-26 MED ORDER — TRAZODONE HCL 50 MG PO TABS
50.0000 mg | ORAL_TABLET | Freq: Every evening | ORAL | Status: DC | PRN
  Administered 2022-05-26 – 2022-05-27 (×2): 50 mg via ORAL
  Filled 2022-05-26 (×2): qty 1

## 2022-05-26 MED ORDER — PANTOPRAZOLE SODIUM 40 MG PO TBEC
40.0000 mg | DELAYED_RELEASE_TABLET | Freq: Every day | ORAL | Status: DC
  Administered 2022-05-26 – 2022-05-28 (×3): 40 mg via ORAL
  Filled 2022-05-26 (×3): qty 1

## 2022-05-26 MED ORDER — DIPHENHYDRAMINE HCL 25 MG PO CAPS
50.0000 mg | ORAL_CAPSULE | Freq: Once | ORAL | Status: DC
  Filled 2022-05-26: qty 2

## 2022-05-26 MED ORDER — VITAMIN B-12 100 MCG PO TABS
100.0000 ug | ORAL_TABLET | Freq: Every morning | ORAL | Status: DC
  Administered 2022-05-27 – 2022-05-28 (×2): 100 ug via ORAL
  Filled 2022-05-26 (×3): qty 1

## 2022-05-26 MED ORDER — FLUTICASONE FUROATE-VILANTEROL 200-25 MCG/ACT IN AEPB
1.0000 | INHALATION_SPRAY | Freq: Every day | RESPIRATORY_TRACT | Status: DC
  Administered 2022-05-27 – 2022-05-28 (×2): 1 via RESPIRATORY_TRACT
  Filled 2022-05-26: qty 28

## 2022-05-26 MED ORDER — IBUPROFEN 200 MG PO TABS
200.0000 mg | ORAL_TABLET | Freq: Four times a day (QID) | ORAL | Status: DC | PRN
  Administered 2022-05-27 – 2022-05-28 (×2): 200 mg via ORAL
  Filled 2022-05-26 (×2): qty 1

## 2022-05-26 MED ORDER — IVABRADINE HCL 5 MG PO TABS
5.0000 mg | ORAL_TABLET | Freq: Two times a day (BID) | ORAL | Status: DC
  Administered 2022-05-26 – 2022-05-28 (×4): 5 mg via ORAL
  Filled 2022-05-26 (×4): qty 1

## 2022-05-26 MED ORDER — ALBUTEROL SULFATE (2.5 MG/3ML) 0.083% IN NEBU: 2.5000 mg | INHALATION_SOLUTION | Freq: Four times a day (QID) | RESPIRATORY_TRACT | Status: DC | PRN

## 2022-05-26 MED ORDER — PREGABALIN 50 MG PO CAPS
100.0000 mg | ORAL_CAPSULE | Freq: Three times a day (TID) | ORAL | Status: DC
  Administered 2022-05-26 – 2022-05-28 (×7): 100 mg via ORAL
  Filled 2022-05-26: qty 1
  Filled 2022-05-26 (×2): qty 2
  Filled 2022-05-26 (×2): qty 1
  Filled 2022-05-26 (×2): qty 2

## 2022-05-26 MED ORDER — LORAZEPAM 2 MG/ML IJ SOLN
2.0000 mg | Freq: Once | INTRAMUSCULAR | Status: AC
  Administered 2022-05-26: 2 mg via INTRAMUSCULAR
  Filled 2022-05-26: qty 1

## 2022-05-26 MED ORDER — HYDROXYZINE HCL 25 MG PO TABS
25.0000 mg | ORAL_TABLET | Freq: Every day | ORAL | Status: DC
  Administered 2022-05-26 – 2022-05-28 (×3): 25 mg via ORAL
  Filled 2022-05-26 (×3): qty 1

## 2022-05-26 MED ORDER — CYCLOBENZAPRINE HCL 10 MG PO TABS
10.0000 mg | ORAL_TABLET | Freq: Every day | ORAL | Status: DC
  Administered 2022-05-26 – 2022-05-27 (×2): 10 mg via ORAL
  Filled 2022-05-26 (×2): qty 1

## 2022-05-26 MED ORDER — CARIPRAZINE HCL 1.5 MG PO CAPS
4.5000 mg | ORAL_CAPSULE | Freq: Every morning | ORAL | Status: DC
  Administered 2022-05-26 – 2022-05-28 (×3): 4.5 mg via ORAL
  Filled 2022-05-26 (×4): qty 3

## 2022-05-26 MED ORDER — ESCITALOPRAM OXALATE 10 MG PO TABS
10.0000 mg | ORAL_TABLET | Freq: Every day | ORAL | Status: DC
  Administered 2022-05-26 – 2022-05-28 (×3): 10 mg via ORAL
  Filled 2022-05-26 (×3): qty 1

## 2022-05-26 MED ORDER — METOPROLOL SUCCINATE ER 25 MG PO TB24
25.0000 mg | ORAL_TABLET | Freq: Every day | ORAL | Status: DC
  Administered 2022-05-26 – 2022-05-28 (×3): 25 mg via ORAL
  Filled 2022-05-26 (×3): qty 1

## 2022-05-26 MED ORDER — DICLOFENAC SODIUM 1 % EX GEL
1.0000 | Freq: Four times a day (QID) | CUTANEOUS | Status: DC
  Administered 2022-05-27 – 2022-05-28 (×6): 1 via TOPICAL
  Filled 2022-05-26: qty 100

## 2022-05-26 NOTE — ED Provider Notes (Addendum)
Emergency Medicine Observation Re-evaluation Note  Ninetta Adelstein is a 32 y.o. female, seen on rounds today.  Pt initially presented to the ED for complaints of Drug Overdose and Suicide Attempt Currently, the patient is exhibiting bizarre behaviors.  Physical Exam  BP 118/72   Pulse 74   Temp 98 F (36.7 C) (Oral)   Resp 17   Ht  (1.626 m)   Wt 95.3 kg   LMP 04/25/2022 (Approximate)   SpO2 90%   BMI 36.05 kg/m  Physical Exam General: Resting comfortably in stretcher Lungs: Normal work of breathing Psych: Calm and cooperative  ED Course / MDM  EKG:EKG Interpretation  Date/Time:  Friday May 25 2022 18:26:44 EDT Ventricular Rate:  93 PR Interval:  139 QRS Duration: 91 QT Interval:  387 QTC Calculation: 482 R Axis:   33 Text Interpretation: Sinus rhythm Inferior infarct, old No significant change since last tracing Confirmed by Richardean Canal [phone]) on 05/25/2022 11:02:08 PM  I have reviewed the labs performed to date as well as medications administered while in observation.  Recent changes in the last 24 hours include patient exhibiting bizarre behaviors including taking apart the hand sanitizer machine, looking batteries, and sticking her hands in the sharps containers.  Patient moved to hallway bed with less dangerous objects nearby.  Sitter available.  Evaluated by TTS.  Plan  Current plan is for inpatient hospitalization.  Pt later stated that she wanted to run out in front of traffic and kill herself and was trying to leave. IVC paperwork filled out. Also trying to scratch herself repeatedly. Soft wrist restraints and meds ordered to prevent self harm.    Rondel Baton, MD 05/26/22 1240    Rondel Baton, MD 05/26/22 [phone]

## 2022-05-26 NOTE — ED Notes (Signed)
Patient resting in bed, no s/s of distress, per night shift and staffing there is not a Recruitment consultant available.

## 2022-05-26 NOTE — ED Notes (Signed)
Patient still tearing her room apart, advised to please stop patient states " I don't have to, I do what I want to do", " I don't have to listen to you".

## 2022-05-26 NOTE — ED Notes (Signed)
TTS started at this time.

## 2022-05-26 NOTE — ED Notes (Signed)
Pt is scratching herself and stating when she leaves she is going to jump in front of a car and kill herself. Restraints applied and assessed. Skin circulation is good. MD notified and sitter is present with the patient.
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
## 2022-05-26 NOTE — ED Notes (Addendum)
TTS complete. Evelyn Asper, NP recommends inpatient psychiatric treatment and AC at [facility] will review for possible admission.

## 2022-05-26 NOTE — ED Notes (Signed)
Jarold Motto, MD notified of patients behavior and that she states she is hearing voices.

## 2022-05-26 NOTE — ED Notes (Signed)
Patient reassured we are here to help her. Patient notified if her behavior calms down, she will be able to come out of restraints. Sitter is at bedside and continuing to monitor.

## 2022-05-26 NOTE — ED Notes (Signed)
IVC paperwork is done. In the green zone nurses sation. IVC'd: 05/26/22 EXP: 05/13/22

## 2022-05-26 NOTE — Progress Notes (Signed)
LCSW Progress Note  [id number]   Shell Blanchette  05/26/2022  4:32 PM    Inpatient Behavioral Health Placement  Pt meets inpatient criteria per Ophelia Shoulder, NP. There are no available beds within [facility] system per [facility] AC Rosey Bath, RN. Referral was sent to the following facilities;   Destination  Service Provider Address Phone Fax  [facility]  [address] [place] [address] [phone]  [facility] [address]  [address], [place] [address] [phone]  [facility] [address] [place]  [address] [place], [place] [address] [phone]  [facility]  [address] [name], [place] [address] [phone]  [facility]  [address] [place] [address] [phone]  [facility]  [address] [place], [place] [address] [phone]  [facility]  [address] [place] [address] [phone]  [facility]  [address] [place] [address] [phone]  [facility]  [address] [place] [address] [phone]  [facility]  [address], [place] [address] [phone]  [facility]  [address] [place] [address] [phone]  [facility]  [address] [place] [address] [phone]  [facility]  [address] [place] [address] [phone]  [facility]  [address], [place] [address] [phone]  [facility]  [address], [place] [address] [phone]  [facility]  [address], [place]  [address] [phone]  [facility]  [address] [place] [address] [phone]  [facility]  [address] [place] [address] [phone]  [facility]  [address] [place] [address] [phone]    Situation ongoing,  CSW will follow up.    Maryjean Ka, MSW, [facility] 05/26/2022 4:32 PM

## 2022-05-26 NOTE — ED Notes (Signed)
Pt is requesting to be transferred to [facility]. Pt stated "They will help me the best there and I'm willing to wait for a bed"

## 2022-05-26 NOTE — ED Notes (Signed)
Staffing notified of sitter need. Was informed no sitters available. Charge RN made aware.

## 2022-05-26 NOTE — ED Notes (Signed)
Patient took apart the hand sanitizer machine, licking the batteries, and sticking her hands in the sharps container, advised to please stop, germs and other health risks, patient is exhibiting child like defiant behaviors.  [place] PD advised patient to stop, Martie Lee, Consulting civil engineer notified.

## 2022-05-27 DIAGNOSIS — F4325 Adjustment disorder with mixed disturbance of emotions and conduct: Secondary | ICD-10-CM | POA: Diagnosis not present

## 2022-05-27 DIAGNOSIS — F314 Bipolar disorder, current episode depressed, severe, without psychotic features: Secondary | ICD-10-CM | POA: Diagnosis not present

## 2022-05-27 LAB — RESP PANEL BY RT-PCR (RSV, FLU A&B, COVID)  RVPGX2
Influenza A by PCR: NEGATIVE
Influenza B by PCR: NEGATIVE
Resp Syncytial Virus by PCR: NEGATIVE
SARS Coronavirus 2 by RT PCR: NEGATIVE

## 2022-05-27 MED ORDER — BACITRACIN ZINC 500 UNIT/GM EX OINT
TOPICAL_OINTMENT | Freq: Two times a day (BID) | CUTANEOUS | Status: DC
  Administered 2022-05-27 – 2022-05-28 (×3): 1 via TOPICAL
  Filled 2022-05-27: qty 0.9

## 2022-05-27 NOTE — ED Notes (Signed)
Pt given her Bible from her belongings to read.

## 2022-05-27 NOTE — ED Notes (Signed)
Pt making second call of the day.

## 2022-05-27 NOTE — ED Notes (Signed)
Patient is getting irritable attempting to leave. States she wants to go to a specific facilty, unsure of the name at this time. After several verbal prompting pt went back to room.

## 2022-05-27 NOTE — Progress Notes (Signed)
Pt was accepted to [facility] TOMORROW 05/28/2022; Bed Assignment Main Campus   Pt meets inpatient criteria per Hillery Jacks, NP    Attending Physician will be Dr. Loni Beckwith   Report can be called to:[phone]-Pager number, please leave a returned phone number to receive a phone call back.    Pt can arrive after 9:00am on 05/28/2022  Care Team: Hillery Jacks, NP, Kathlene Cote, RN, [place], LCSWA   Arliss Journey MSW, [facility] 05/27/2022 6:10 PM

## 2022-05-27 NOTE — ED Provider Notes (Signed)
Emergency Medicine Observation Re-evaluation Note  [name] is a 32 y.o. female, seen on rounds today.  Pt initially presented to the ED for complaints of Drug Overdose and Suicide Attempt Currently, the patient is not having any acute complaint.  Physical Exam  BP 103/66 (BP Location: Right Arm)   Pulse 82   Temp 98 F (36.7 C) (Oral)   Resp 18   Ht  (1.626 m)   Wt 95.3 kg   LMP 04/25/2022 (Approximate)   SpO2 100%   BMI 36.05 kg/m  Physical Exam General: Resting comfortably in stretcher Lungs: Normal work of breathing Psych: Calm  ED Course / MDM  EKG:EKG Interpretation  Date/Time:  Saturday May 26 2022 00:31:49 EDT Ventricular Rate:  96 PR Interval:  130 QRS Duration: 89 QT Interval:  383 QTC Calculation: 484 R Axis:   35 Text Interpretation: Sinus rhythm Inferior infarct, old Confirmed by Vonita Moss [id number]) on 05/26/2022 2:16:30 PM  I have reviewed the labs performed to date as well as medications administered while in observation.  Recent changes in the last 24 hours include IVC paperwork filled out.  Intermittently requiring soft restraints because she is scratching herself occasionally.  Was given Benadryl.  Plan  Current plan is for inpatient placement.    Rondel Baton, MD 05/27/22 1145

## 2022-05-27 NOTE — ED Notes (Signed)
Pt attempted to walk out the doors of purple, easily redirected back to her room.
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
## 2022-05-27 NOTE — Progress Notes (Signed)
LCSW Progress Note  [id number]   [name]  05/27/2022  5:50 PM    Inpatient Behavioral Health Placement  Pt meets inpatient criteria per Hillery Jacks, NP. There are no available beds within [facility] system per [facility] Sharyne Peach, RN. Referral was sent to the following facilities;   Destination  Service Provider Address Phone Fax  [facility]  [address], [place] [address] [phone]  [facility] [address]  [address] [place] [address] [phone]  [facility] [address] [place]  [address] [place], [place] [address] [phone]  [facility]  [address] [place], [place] [address] [phone]  [facility]  [address] [place] [address] [phone]  [facility]  [address] [place], [place] [address] [phone]  [facility]  [address] [place] [address] [phone]  [facility]  [address] [place] [address] [phone]  [facility]  [address] [place] [address] [phone]  [facility]  [address] [place] [address] [phone]  [facility]  [address] [place] [address] [phone]  [facility]  [address] [place] [address] [phone]  [facility]  [address] [place] [address] [phone]  [facility]  [address] [place] [address] [phone]  [facility]  [address] [place] [address] [phone]  [facility]  [address], [place]  [address] [phone]  [facility]  [address] [place], [place] [address] [phone]  [facility]  [address] [place] [address] [phone]  [facility]  [address] [place] [address] [phone]  [facility]  [address], [place] [address] [phone]  [facility]  [address] [place] [address] [phone]  [facility]  [address] [place] [address] [phone]  [facility]  [address] [place] [address] [phone]  [facility]  [address] [place] [address] [phone]  [facility]  [address], [place] [address] [phone]  [facility]  [address], [place] [address] [phone]  [facility]  [address] [place] [address] [phone]  [facility]  [address], [place] [address] [phone]  [facility]  [address], [place] [address] [phone]  [facility]  [address] [place] [address] [phone]    Situation ongoing,  CSW will follow up.    Maryjean Ka, MSW, LCSWA 05/27/2022 5:50 PM

## 2022-05-27 NOTE — ED Notes (Signed)
Tele psych in progress with pt.

## 2022-05-27 NOTE — ED Notes (Signed)
Pt making morning phone call.

## 2022-05-28 ENCOUNTER — Other Ambulatory Visit (HOSPITAL_COMMUNITY): Payer: Self-pay

## 2022-05-28 DIAGNOSIS — F314 Bipolar disorder, current episode depressed, severe, without psychotic features: Secondary | ICD-10-CM | POA: Diagnosis not present

## 2022-05-28 DIAGNOSIS — F4325 Adjustment disorder with mixed disturbance of emotions and conduct: Secondary | ICD-10-CM | POA: Diagnosis not present

## 2022-05-28 MED ORDER — CARIPRAZINE HCL 1.5 MG PO CAPS
4.5000 mg | ORAL_CAPSULE | Freq: Every day | ORAL | 0 refills | Status: AC
  Filled 2022-05-28: qty 21, 7d supply, fill #0

## 2022-05-28 NOTE — ED Notes (Signed)
Called pager line at [facility] and left voicemail w/ callback number for report

## 2022-05-28 NOTE — Discharge Instructions (Signed)
  Outpatient psychiatric Services  Walk in hours for medication management Monday, Wednesday, Thursday, and Friday from 8:00 AM to 11:00 AM Recommend arriving by by 7:30 AM.  It is first come first serve.    Walk in hours for therapy intake Monday and Wednesday only 8:00 AM to 11:00 AM Encouraged to arrive by 7:30 AM.  It is first come first serve   Inpatient patient psychiatric services [facility] offers comprehensive behavioral heath care services for mental health and substance abuse treatment.  Social work can also assist with referral to or getting you into a rehabilitation program short or long term    

## 2022-05-28 NOTE — ED Notes (Signed)
Reviewed discharge instructions with patient. Follow-up care and medications reviewed. Patient verbalized understanding. Patient A&Ox4, VSS, and ambulatory with steady gait upon discharge.  All belongings returned to pt. Pt given [employer].

## 2022-05-28 NOTE — Discharge Summary (Signed)
[facility] Psych ED Discharge  05/28/2022 10:54 AM [name]  MRN:  [id number]  Principal Problem: Adjustment disorder with mixed disturbance of emotions and conduct Discharge Diagnoses: Principal Problem:   Adjustment disorder with mixed disturbance of emotions and conduct Active Problems:   Borderline personality disorder   Suicidal behavior  Clinical Impression:  Final diagnoses:  Suicidal behavior with attempted self-injury  Anxiety   Subjective:  Patient seen this morning at [facility] for face to face psychiatric evaluation. Pt originally admitted on 05/25/22 for suicidal ideations and attempting suicide by consuming 4 days worth of her medications. She actually received an inpatient bed at [facility], however is requesting to speak with psychiatry to discharge vs. transferring to inpatient unit.   Pt is pleading to be discharged stating "I don't like [facility] I have been there before. I really would rather go home. I was just at an inpatient unit last week and I just don't think it will help I would rather keep doing outpatient." Pt reprots a CST through [place] who keeps up with her medications. She also reports seeing a therapist one time per week. She states "I regret what I did. I shouldn't have reacted like that and I just did it. I recently lost custody of my daughter, but I don't want to die I want to be better and get her back." Pt is denying any suicidal or homicidal ideations. She denies auditory or visual hallucinations. She does verbally report good follow up plan, coping skills, and reasons to live. She states she lives with a roommate that she likes, no complaints of housing at this time. She is able to contract for safety, again mentions being motivated to "being better" to get her daughter back. She denies needing refill of medications at this time.   This is the patient's 14th presentation to ED/UC for suicidal ideations since the start of January 2024. Pt has been  psychiatrically hospitalized numerous times, and she usually presents to ED/UC again shortly after discharge from inpatient unit. I do not think inpatient hospitalization would be beneficial at this time, considering she was just discharged from [facility] last week. Pt is willing to continue to progress in outpatient setting, which I believe would be more beneficial long term. Will psychiatrically clear her for OP follow up.   ED Assessment Time Calculation: Start Time: 0900 Stop Time: 1000 Total Time in Minutes (Assessment Completion): 60   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 fragile X syndrome    Chronic constipation    Depression    Drug-seeking behavior    Essential tremor    Headache    Overdose of acetaminophen 07/2017   and other meds   Personality disorder    Schizo-affective psychosis    Schizoaffective disorder, bipolar type    Seizures    Last seizure December 2017   Sleep apnea     Past Surgical History:  Procedure Laterality Date   MOUTH SURGERY  2009 or 2010   Family History:  Family History  Problem Relation Age of Onset   Mental illness Father    Asthma Father    PDD Brother    Seizures Brother    Social History:  Social History   Substance and Sexual Activity  Alcohol Use No   Alcohol/week: 1.0 standard  drink of alcohol   Types: 1 Standard drinks or equivalent per week   Comment: denies at this time     Social History   Substance and Sexual Activity  Drug Use No   Comment: History of cocaine use at age 56 for 4 months    Social History   Socioeconomic History   Marital status: Widowed    Spouse name: Not on file   Number of children: 0   Years of education: Not on file   Highest education level: Not on file  Occupational History   Occupation: disability  Tobacco Use   Smoking status: Former    Packs/day: 0     Types: Cigarettes   Smokeless tobacco: Never   Tobacco comments:    Smoked for 2  years age [age]  Vaping Use   Vaping Use: Never used  Substance and Sexual Activity   Alcohol use: No    Alcohol/week: 1.0 standard drink of alcohol    Types: 1 Standard drinks or equivalent per week    Comment: denies at this time   Drug use: No    Comment: History of cocaine use at age 55 for 4 months   Sexual activity: Not Currently    Birth control/protection: None  Other Topics Concern   Not on file  Social History Narrative   Marital status: Widowed      Children: daughter      Lives: with boyfriend, in two story home      Employment:  Disability      Tobacco: quit smoking; smoked for two years.      Alcohol ;none      Drugs: none   Has not traveled outside of the country.   Right handed         Social Determinants of Health   Financial Resource Strain: Not on file  Food Insecurity: No Food Insecurity (05/06/2022)   Hunger Vital Sign    Worried About Running Out of Food in the Last Year: Never true    Ran Out of Food in the Last Year: Never true  Transportation Needs: Unmet Transportation Needs (05/06/2022)   PRAPARE - Administrator, Civil Service (Medical): Yes    Lack of Transportation (Non-Medical): Yes  Physical Activity: Not on file  Stress: Not on file  Social Connections: Not on file    Tobacco Cessation:  A prescription for an FDA-approved tobacco cessation medication was offered at discharge and the patient refused  Current Medications: Current Facility-Administered Medications  Medication Dose Route Frequency Provider Last Rate Last Admin   albuterol (PROVENTIL) (2.5 MG/3ML) 0.083% nebulizer solution 2.5 mg  2.5 mg Nebulization Q6H PRN Rondel Baton, MD       bacitracin ointment   Topical BID Rondel Baton, MD   1 Application at 05/28/22 6295   cariprazine (VRAYLAR) capsule 4.5 mg  4.5 mg Oral q AM Smoot, Caralina A, PA-C   4.5 mg at 05/28/22 0947    cyclobenzaprine (FLEXERIL) tablet 10 mg  10 mg Oral QHS Rondel Baton, MD   10 mg at 05/27/22 2105   diclofenac Sodium (VOLTAREN) 1 % topical gel 1 Application  1 Application Topical QID Rondel Baton, MD   1 Application at 05/28/22 0918   escitalopram (LEXAPRO) tablet 10 mg  10 mg Oral Daily Rondel Baton, MD   10 mg at 05/28/22 0917   fluticasone furoate-vilanterol (BREO ELLIPTA) 200-25 MCG/ACT 1 puff  1 puff Inhalation Daily  Rondel Baton, MD   1 puff at 05/28/22 1914   hydrOXYzine (ATARAX) tablet 25 mg  25 mg Oral Daily Smoot, Shikita Vaillancourt, PA-C   25 mg at 05/28/22 7829   ibuprofen (ADVIL) tablet 200 mg  200 mg Oral Q6H PRN Rondel Baton, MD   200 mg at 05/28/22 1042   ivabradine (CORLANOR) tablet 5 mg  5 mg Oral BID Rondel Baton, MD   5 mg at 05/28/22 5621   melatonin tablet 3 mg  3 mg Oral QHS Smoot, Cheynne A, PA-C   3 mg at 05/27/22 2108   metoprolol succinate (TOPROL-XL) 24 hr tablet 25 mg  25 mg Oral Daily Rondel Baton, MD   25 mg at 05/28/22 0917   OLANZapine (ZYPREXA) tablet 5 mg  5 mg Oral Daily PRN Tyaira, Heward, PA-C   5 mg at 05/27/22 2104   pantoprazole (PROTONIX) EC tablet 40 mg  40 mg Oral Daily Rondel Baton, MD   40 mg at 05/28/22 3086   prazosin (MINIPRESS) capsule 1 mg  1 mg Oral QHS Rondel Baton, MD   1 mg at 05/27/22 2106   pregabalin (LYRICA) capsule 100 mg  100 mg Oral TID Maritza, Hosterman, PA-C   100 mg at 05/28/22 5784   traZODone (DESYREL) tablet 50 mg  50 mg Oral QHS PRN Kashae, Carstens, PA-C   50 mg at 05/27/22 2105   vitamin B-12 (CYANOCOBALAMIN) tablet 100 mcg  100 mcg Oral q AM Rondel Baton, MD   100 mcg at 05/28/22 6962   Current Outpatient Medications  Medication Sig Dispense Refill   albuterol (VENTOLIN HFA) 108 (90 Base) MCG/ACT inhaler Inhale 2 puffs into the lungs every 6 (six) hours as needed for wheezing or shortness of breath.     BREYNA 160-4.5 MCG/ACT inhaler Inhale 1 puff into the lungs in the morning and  at bedtime.     cariprazine (VRAYLAR) 1.5 MG capsule Take 3 capsules (4.5 mg total) by mouth daily for 7 days. 21 capsule 0   CORLANOR 5 MG TABS tablet Take 5 mg by mouth 2 (two) times daily.     Cyanocobalamin (VITAMIN B-12 PO) Take 1 capsule by mouth in the morning.     cyclobenzaprine (FLEXERIL) 10 MG tablet Take 10 mg by mouth at bedtime.     diclofenac Sodium (VOLTAREN) 1 % GEL Apply 1 Application topically 4 (four) times daily.     escitalopram (LEXAPRO) 10 MG tablet Take 1 tablet (10 mg total) by mouth daily for 14 days. 14 tablet 0   hydrOXYzine (ATARAX) 25 MG tablet Take 25 mg twice daily by mouth as needed for anxiety. Take 100 mg by mouth for sleep as needed at bedtime. (Patient taking differently: Take 25 mg by mouth daily.) 7 tablet 0   Melatonin 3 MG CAPS Take 3 mg by mouth at bedtime.     OLANZapine (ZYPREXA) 5 MG tablet Take 1 tablet (5 mg total) by mouth daily as needed for up to 7 days (anxiety). (Patient taking differently: Take 5 mg by mouth at bedtime.) 7 tablet 0   pantoprazole (PROTONIX) 40 MG tablet Take 1 tablet (40 mg total) by mouth daily for 14 days. 14 tablet 0   prazosin (MINIPRESS) 1 MG capsule Take 1 capsule (1 mg total) by mouth at bedtime for 14 days. 14 capsule 0   pregabalin (LYRICA) 100 MG capsule Take 100 mg by mouth 3 (three) times daily.  traZODone (DESYREL) 50 MG tablet Take 1 tablet (50 mg total) by mouth at bedtime as needed for up to 7 days for sleep. (Patient taking differently: Take 50 mg by mouth at bedtime.) 7 tablet 0   metoprolol succinate (TOPROL-XL) 25 MG 24 hr tablet Take 1 tablet (25 mg total) by mouth daily. (Patient not taking: Reported on 05/19/2022) 15 tablet 0   PTA Medications: (Not in a hospital admission)   [place] Scale:  Flowsheet Row ED from [facility]20[facility] at [facility] ED from 05/19/2022 in [facility] Department at [facility] ED from 05/15/2022 in [facility]  C-SSRS RISK CATEGORY High Risk High Risk High Risk       Psychiatric Specialty Exam: Presentation  General Appearance:  Appropriate for Environment  Eye Contact: Good  Speech: Clear and Coherent  Speech Volume: Normal  Handedness: Right   Mood and Affect  Mood: Euthymic  Affect: Congruent   Thought Process  Thought Processes: Coherent  Descriptions of Associations:Intact  Orientation:Full (Time, Place and Person)  Thought Content:WDL  History of Schizophrenia/Schizoaffective disorder:No  Duration of Psychotic Symptoms:N/A  Hallucinations:Hallucinations: None  Ideas of Reference:None  Suicidal Thoughts:Suicidal Thoughts: No  Homicidal Thoughts:Homicidal Thoughts: No   Sensorium  Memory: Immediate Fair; Recent Fair  Judgment: Fair  Insight: Good   Executive Functions  Concentration: Good  Attention Span: Good  Recall: Good  Fund of Knowledge: Good  Language: Good   Psychomotor Activity  Psychomotor Activity: Psychomotor Activity: Normal   Assets  Assets: Desire for Improvement; Leisure Time; Physical Health; Resilience; Social Support   Sleep  Sleep: Sleep: Good    Physical Exam: Physical Exam Neurological:     Mental Status: She is alert and oriented to person, place, and time.  Psychiatric:        Attention and Perception: Attention normal.        Mood and Affect: Mood normal.        Speech: Speech normal.        Behavior: Behavior is cooperative.        Thought Content: Thought content normal.    ROS Blood pressure 116/67, pulse 86, temperature 97.8 F (36.6 C), resp. rate 16, height 5\' 4"  (1.626 m), weight 95.3 kg, last menstrual period 04/25/2022, SpO2 100 %. Body mass index is 36.05 kg/m.   Demographic Factors:  Caucasian, Low socioeconomic status, and Unemployed  Loss Factors: Legal issues  Historical Factors: Prior suicide attempts and Impulsivity  Risk  Reduction Factors:   Responsible for children under 32 years of age, Sense of responsibility to family, Religious beliefs about death, Living with another person, especially a relative, Positive social support, Positive therapeutic relationship, and Positive coping skills or problem solving skills  Continued Clinical Symptoms:  Personality Disorders:   Cluster B More than one psychiatric diagnosis  Cognitive Features That Contribute To Risk:  None    Suicide Risk:  Mild:  Suicidal ideation of limited frequency, intensity, duration, and specificity.  There are no identifiable plans, no associated intent, mild dysphoria and related symptoms, good self-control (both objective and subjective assessment), few other risk factors, and identifiable protective factors, including available and accessible social support.    Plan Of Care/Follow-up recommendations:  Other:  please continue to follow up with OP psychiatry and [facility] services  Medical Decision Making: Pt case reviewed and discussed with Dr. Lucianne Muss. Will psychiatrically clear patient for discharge. She is able to contract for safety at  this time, and is requesting to discharge vs. Inpatient treatment. Will add resources to AVS. Pt denies needing refill of medications at this time.    Disposition: psych cleared  Eligha Bridegroom, NP 05/28/2022, 10:54 AM

## 2022-05-28 NOTE — ED Provider Notes (Signed)
Patient accepted to [facility], Dr Sofie Hartigan.   Pt is alert, content, no new c/o this AM. Normal vitals.   Pt appears stable for transport/transfer.    Cathren Laine, MD 05/28/22 1045

## 2022-05-28 NOTE — ED Notes (Signed)
Left a message w/ [facility] pager number to give this RN a call back to update them that pt is being discharged per [facility].

## 2022-05-28 NOTE — ED Notes (Signed)
Noted pt did not have IVC order in chart despite pt being IVC'd. Placed order and called staffing for a sitter. Per staffing, no sitters available at this time.

## 2022-05-28 NOTE — ED Provider Notes (Addendum)
Emergency Medicine Observation Re-evaluation Note  [name] is a 32 y.o. female, seen on rounds today.  Pt initially presented to the ED for complaints of Drug Overdose and Suicide Attempt Currently, the patient is resting in NAD.  Physical Exam  BP 111/65   Pulse 68   Temp 97.8 F (36.6 C)   Resp 16   Ht  (1.626 m)   Wt 95.3 kg   LMP 04/25/2022 (Approximate)   SpO2 100%   BMI 36.05 kg/m  Physical Exam General: Appears to be resting comfortably in bed, no acute distress. Cardiac: Regular rate, normal heart rate, non-emergent blood pressure for this morning's vitals. Lungs: No increased work of breathing.  Equal chest rise appreciated Psych: Calm, asleep in bed.   ED Course / MDM  EKG:EKG Interpretation  Date/Time:  Saturday May 26 2022 00:31:49 EDT Ventricular Rate:  96 PR Interval:  130 QRS Duration: 89 QT Interval:  383 QTC Calculation: 484 R Axis:   35 Text Interpretation: Sinus rhythm Inferior infarct, old Confirmed by Vonita Moss [phone]) on 05/26/2022 2:16:30 PM  I have reviewed the labs performed to date as well as medications administered while in observation..  Plan  Current plan is for IP psychiatric care per most recent psychiatric assessment. Appreciate psychiatric team's plan for daily reassessment that may change this disposition.     Glyn Ade, MD 05/28/22 [phone]  I received an update from psychiatry that they have reassessed the patient today and deemed her stable for outpatient care and management. Patient is ambulatory in no acute distress.  Medication to be managed by primary psychiatrist.  Otherwise no acute indication for further intervention in the emergency room.   Glyn Ade, MD 05/28/22 1109

## 2022-06-02 ENCOUNTER — Other Ambulatory Visit: Payer: Self-pay

## 2022-06-02 ENCOUNTER — Emergency Department (HOSPITAL_COMMUNITY)
Admission: EM | Admit: 2022-06-02 | Discharge: 2022-06-02 | Disposition: A | Discharge status: Home / Self Care | Payer: Medicare PPO | Source: Home / Self Care | Attending: Emergency Medicine | Admitting: Emergency Medicine

## 2022-06-02 ENCOUNTER — Emergency Department (HOSPITAL_COMMUNITY): Payer: Medicare PPO

## 2022-06-02 DIAGNOSIS — M25512 Pain in left shoulder: Secondary | ICD-10-CM | POA: Insufficient documentation

## 2022-06-02 DIAGNOSIS — T43212A Poisoning by selective serotonin and norepinephrine reuptake inhibitors, intentional self-harm, initial encounter: Secondary | ICD-10-CM | POA: Diagnosis not present

## 2022-06-02 DIAGNOSIS — R45851 Suicidal ideations: Secondary | ICD-10-CM

## 2022-06-02 DIAGNOSIS — T50902A Poisoning by unspecified drugs, medicaments and biological substances, intentional self-harm, initial encounter: Secondary | ICD-10-CM | POA: Diagnosis not present

## 2022-06-02 MED ORDER — CYCLOBENZAPRINE HCL 10 MG PO TABS
5.0000 mg | ORAL_TABLET | Freq: Once | ORAL | Status: AC
  Administered 2022-06-02: 5 mg via ORAL
  Filled 2022-06-02: qty 1

## 2022-06-02 MED ORDER — METHOCARBAMOL 500 MG PO TABS
500.0000 mg | ORAL_TABLET | Freq: Once | ORAL | Status: AC
  Administered 2022-06-02: 500 mg via ORAL
  Filled 2022-06-02 (×2): qty 1

## 2022-06-02 MED ORDER — KETOROLAC TROMETHAMINE 60 MG/2ML IM SOLN
60.0000 mg | Freq: Once | INTRAMUSCULAR | Status: AC
  Administered 2022-06-02: 60 mg via INTRAMUSCULAR
  Filled 2022-06-02 (×2): qty 2

## 2022-06-02 MED ORDER — LIDOCAINE 4 % EX PTCH: 1.0000 | MEDICATED_PATCH | CUTANEOUS | 0 refills | Status: DC

## 2022-06-02 MED ORDER — METHOCARBAMOL 500 MG PO TABS: 1000.0000 mg | ORAL_TABLET | Freq: Once | ORAL | Status: DC

## 2022-06-02 NOTE — ED Provider Notes (Signed)
[place] EMERGENCY DEPARTMENT AT [facility] Provider Note   CSN: [id number] Arrival date & time: 06/02/22  0141     History  Chief Complaint  Patient presents with   Shoulder Pain    [name] is a 32 y.o. female.  HPI   Patient with medical history including autism, seizures, schizoaffective disorder, anxiety, presenting with complaints of left-sided shoulder pain.  Been going on for weeks time, states that she got this pain after she was restrained while she was in the ED one week ago.  She states that the pain  remain constant, and is worsened with movement, improved with rest, will occasionally have paresthesias moving down her left arm, no associated chest pain or shortness of breath, she is taking ibuprofen and icing without much relief.    Home Medications Prior to Admission medications   Medication Sig Start Date End Date Taking? Authorizing Provider  albuterol (VENTOLIN HFA) 108 (90 Base) MCG/ACT inhaler Inhale 2 puffs into the lungs every 6 (six) hours as needed for wheezing or shortness of breath.    [provider]  BREYNA 160-4.5 MCG/ACT inhaler Inhale 1 puff into the lungs in the morning and at bedtime. 04/18/22   [provider]  cariprazine (VRAYLAR) 1.5 MG capsule Take 3 capsules (4.5 mg total) by mouth daily for 7 days. 05/28/22 06/04/22  Glyn Ade, MD  CORLANOR 5 MG TABS tablet Take 5 mg by mouth 2 (two) times daily. 05/14/22   [provider]  Cyanocobalamin (VITAMIN B-12 PO) Take 1 capsule by mouth in the morning.    [provider]  cyclobenzaprine (FLEXERIL) 10 MG tablet Take 10 mg by mouth at bedtime. 05/10/22   [provider]  diclofenac Sodium (VOLTAREN) 1 % GEL Apply 1 Application topically 4 (four) times daily.    [provider]  escitalopram (LEXAPRO) 10 MG tablet Take 1 tablet (10 mg total) by mouth daily for 14 days. 02/07/22 05/26/22  Massengill, Harrold Donath, MD  hydrOXYzine  (ATARAX) 25 MG tablet Take 25 mg twice daily by mouth as needed for anxiety. Take 100 mg by mouth for sleep as needed at bedtime. Patient taking differently: Take 25 mg by mouth daily. 04/30/22   Massengill, Harrold Donath, MD  Melatonin 3 MG CAPS Take 3 mg by mouth at bedtime.    [provider]  metoprolol succinate (TOPROL-XL) 25 MG 24 hr tablet Take 1 tablet (25 mg total) by mouth daily. Patient not taking: Reported on 05/19/2022 05/17/22   Rankin, Shuvon B, NP  OLANZapine (ZYPREXA) 5 MG tablet Take 1 tablet (5 mg total) by mouth daily as needed for up to 7 days (anxiety). Patient taking differently: Take 5 mg by mouth at bedtime. 04/30/22 05/26/22  Massengill, Harrold Donath, MD  pantoprazole (PROTONIX) 40 MG tablet Take 1 tablet (40 mg total) by mouth daily for 14 days. 02/07/22 05/26/22  Massengill, Harrold Donath, MD  prazosin (MINIPRESS) 1 MG capsule Take 1 capsule (1 mg total) by mouth at bedtime for 14 days. 02/06/22 05/26/22  Massengill, Harrold Donath, MD  pregabalin (LYRICA) 100 MG capsule Take 100 mg by mouth 3 (three) times daily.    [provider]  traZODone (DESYREL) 50 MG tablet Take 1 tablet (50 mg total) by mouth at bedtime as needed for up to 7 days for sleep. Patient taking differently: Take 50 mg by mouth at bedtime. 04/30/22 05/26/22  Phineas Inches, MD      Allergies    Bee venom, Coconut flavor, Fish allergy,  Geodon [ziprasidone hcl], Haloperidol and related, Lithobid [lithium], Roxicodone [oxycodone], Seroquel [quetiapine], Shellfish allergy, Phenergan [promethazine hcl], Prilosec [omeprazole], Sulfa antibiotics, Tegretol [carbamazepine], Prozac [fluoxetine], Tape, and Tylenol [acetaminophen]    Review of Systems   Review of Systems  Constitutional:  Negative for chills and fever.  Respiratory:  Negative for shortness of breath.   Cardiovascular:  Negative for chest pain.  Gastrointestinal:  Negative for abdominal pain.  Musculoskeletal:        Left-sided shoulder pain  Neurological:   Negative for headaches.    Physical Exam Updated Vital Signs BP (!) 122/51 (BP Location: Right Arm)   Pulse 77   Temp 97.8 F (36.6 C) (Oral)   Resp 18   LMP 04/25/2022 (Approximate) Comment: per pt, not pregnant  SpO2 100%  Physical Exam Vitals and nursing note reviewed.  Constitutional:      General: She is not in acute distress.    Appearance: She is not ill-appearing.  HENT:     Head: Normocephalic and atraumatic.     Nose: No congestion.  Eyes:     Conjunctiva/sclera: Conjunctivae normal.  Cardiovascular:     Rate and Rhythm: Normal rate and regular rhythm.     Pulses: Normal pulses.     Heart sounds: No murmur heard.    No friction rub. No gallop.  Pulmonary:     Effort: No respiratory distress.     Breath sounds: No wheezing, rhonchi or rales.  Musculoskeletal:     Comments: Spine was palpated was nontender to palpation no step-off deformities noted, patient has reproducible pain within the musculature surrounding the left trapezius muscle, she has full range of motion in her fingers wrist elbow as well as her shoulder, sensation intact to light touch, she has 2+ radial pulses, 2-second capillary refill, all compartments are soft.  Skin:    General: Skin is warm and dry.  Neurological:     Mental Status: She is alert.  Psychiatric:        Mood and Affect: Mood normal.     ED Results / Procedures / Treatments   Labs (all labs ordered are listed, but only abnormal results are displayed) Labs Reviewed - No data to display  EKG None  Radiology DG Shoulder Left  Result Date: 06/02/2022 CLINICAL DATA:  Left shoulder pain, initial encounter EXAM: LEFT SHOULDER - 2+ VIEW COMPARISON:  None Available. FINDINGS: There is no evidence of fracture or dislocation. There is no evidence of arthropathy or other focal bone abnormality. Soft tissues are unremarkable. IMPRESSION: No acute abnormality noted. Electronically Signed   By: Alcide Clever M.D.   On: 06/02/2022 02:24     Procedures Procedures    Medications Ordered in ED Medications  cyclobenzaprine (FLEXERIL) tablet 5 mg (5 mg Oral Given 06/02/22 0243)    ED Course/ Medical Decision Making/ A&P                             Medical Decision Making Amount and/or Complexity of Data Reviewed Radiology: ordered.  Risk Prescription drug management.   This patient presents to the ED for concern of shoulder pain, this involves an extensive number of treatment options, and is a complaint that carries with it a high risk of complications and morbidity.  The differential diagnosis includes fracture care dislocation, compartment syndrome,  atypical ACS    Additional history obtained:  Additional history obtained from n/a External records from outside source obtained and reviewed  including recent ER notes   Co morbidities that complicate the patient evaluation  Psychiatric disorder  Social Determinants of Health:  N/A    Lab Tests:  I Ordered, and personally interpreted labs.  The pertinent results include:  n/a   Imaging Studies ordered:  I ordered imaging studies including plain film left shoulder I independently visualized and interpreted imaging which showed negative for acute findings I agree with the radiologist interpretation   Cardiac Monitoring:  The patient was maintained on a cardiac monitor.  I personally viewed and interpreted the cardiac monitored which showed an underlying rhythm of: n/a   Medicines ordered and prescription drug management:  I ordered medication including Flexeril I have reviewed the patients home medicines and have made adjustments as needed  Critical Interventions:  N/a   Reevaluation:  Presents with shoulder pain will obtain x-ray for further evaluation  Imaging unremarkable, patient is in agreement with discharge at this time.  Consultations Obtained:  N/a    Test Considered:  N/a    Rule out   Low suspicion for fracture  or dislocation as x-ray does not feel any significant findings.I doubt atypical ACS presentation atypical she has low risk factors, pain started after she was restrained, pain is worsened with movement and is reproducible.  Low suspicion for compartment syndrome as area was palpated it was soft to the touch, neurovascular fully intact.     Dispostion and problem list  After consideration of the diagnostic results and the patients response to treatment, I feel that the patent would benefit from discharge.  Left shoulder pain-likely muscular in nature, will provide muscle relaxer, follow-up with orthopedics for further assessment.            Final Clinical Impression(s) / ED Diagnoses Final diagnoses:  Acute pain of left shoulder    Rx / DC Orders ED Discharge Orders     None         Barnie Del 06/02/22 0304    Palumbo, April, MD 06/02/22 [phone]

## 2022-06-02 NOTE — ED Triage Notes (Signed)
Pt alerted this triage nurse that she is now suicidal. Charge nurse notified and patient moved back to bed. Pt also c/o ongoing left shoulder pain.

## 2022-06-02 NOTE — ED Provider Notes (Signed)
[place] EMERGENCY DEPARTMENT AT [facility] Provider Note   CSN: [id number] Arrival date & time: 06/02/22  0141     History  Chief Complaint  Patient presents with   Shoulder Pain   Suicidal    Jalayna Josten is a 32 y.o. female.  HPI   Patient with medical history including autism, seizures, schizoaffective disorder, anxiety, presenting with suicidal ideations.  Patient states that she is now suicidal because she cannot deal with the pain in her shoulder.  She states if her pain is controlled she will no longer be suicidal.  Denies any hallucinations or delusions.  Patient was discharged 5 minutes ago, she was initially seen for left shoulder pain, and had made no mention of suicidal ideations.  Patient checked back in because she felt suicidal due to the pain in her shoulder.  I reviewed patient's chart has extensive psychiatric history, has been seen 14 times since January for suicidal ideations.  She was recently admitted to [facility] after she tried to commit suicide by overdose.  Upon receiving a bed at [facility] patient states that she did not want to go and was no longer suicidal.  Psychiatry cleared her and was discharged on the 22nd.  Patient typically presents suicidal after being discharged from the hospital.   Home Medications Prior to Admission medications   Medication Sig Start Date End Date Taking? Authorizing Provider  albuterol (VENTOLIN HFA) 108 (90 Base) MCG/ACT inhaler Inhale 2 puffs into the lungs every 6 (six) hours as needed for wheezing or shortness of breath.    [provider]  BREYNA 160-4.5 MCG/ACT inhaler Inhale 1 puff into the lungs in the morning and at bedtime. 04/18/22   [provider]  cariprazine (VRAYLAR) 1.5 MG capsule Take 3 capsules (4.5 mg total) by mouth daily for 7 days. 05/28/22 06/04/22  Glyn Ade, MD  CORLANOR 5 MG TABS tablet Take 5 mg by mouth 2 (two) times daily. 05/14/22   [provider]  Cyanocobalamin (VITAMIN B-12 PO) Take 1 capsule by mouth in the morning.    [provider]  cyclobenzaprine (FLEXERIL) 10 MG tablet Take 10 mg by mouth at bedtime. 05/10/22   [provider]  diclofenac Sodium (VOLTAREN) 1 % GEL Apply 1 Application topically 4 (four) times daily.    [provider]  escitalopram (LEXAPRO) 10 MG tablet Take 1 tablet (10 mg total) by mouth daily for 14 days. 02/07/22 05/26/22  Massengill, Harrold Donath, MD  hydrOXYzine (ATARAX) 25 MG tablet Take 25 mg twice daily by mouth as needed for anxiety. Take 100 mg by mouth for sleep as needed at bedtime. Patient taking differently: Take 25 mg by mouth daily. 04/30/22   Massengill, Harrold Donath, MD  Melatonin 3 MG CAPS Take 3 mg by mouth at bedtime.    [provider]  metoprolol succinate (TOPROL-XL) 25 MG 24 hr tablet Take 1 tablet (25 mg total) by mouth daily. Patient not taking: Reported on 05/19/2022 05/17/22   Rankin, Shuvon B, NP  OLANZapine (ZYPREXA) 5 MG tablet Take 1 tablet (5 mg total) by mouth daily as needed for up to 7 days (anxiety). Patient taking differently: Take 5 mg by mouth at bedtime. 04/30/22 05/26/22  Massengill, Harrold Donath, MD  pantoprazole (PROTONIX) 40 MG tablet Take 1 tablet (40 mg total) by mouth daily for 14 days. 02/07/22 05/26/22  Massengill, Harrold Donath, MD  prazosin (MINIPRESS) 1 MG capsule Take 1 capsule (1 mg total) by mouth at bedtime for  14 days. 02/06/22 05/26/22  Massengill, Harrold Donath, MD  pregabalin (LYRICA) 100 MG capsule Take 100 mg by mouth 3 (three) times daily.    [provider]  traZODone (DESYREL) 50 MG tablet Take 1 tablet (50 mg total) by mouth at bedtime as needed for up to 7 days for sleep. Patient taking differently: Take 50 mg by mouth at bedtime. 04/30/22 05/26/22  Massengill, Harrold Donath, MD      Allergies    Bee venom, Coconut flavor, Fish allergy, Geodon [ziprasidone hcl], Haloperidol and related, Lithobid [lithium], Roxicodone [oxycodone], Seroquel  [quetiapine], Shellfish allergy, Phenergan [promethazine hcl], Prilosec [omeprazole], Sulfa antibiotics, Tegretol [carbamazepine], Prozac [fluoxetine], Tape, and Tylenol [acetaminophen]    Review of Systems   Review of Systems  Constitutional:  Negative for chills and fever.  Respiratory:  Negative for shortness of breath.   Cardiovascular:  Negative for chest pain.  Gastrointestinal:  Negative for abdominal pain.  Neurological:  Negative for headaches.    Physical Exam Updated Vital Signs BP 110/82 (BP Location: Right Arm)   Pulse 88   Temp 98.3 F (36.8 C)   Resp 17   LMP 04/25/2022 (Approximate) Comment: per pt, not pregnant  SpO2 99%  Physical Exam Vitals and nursing note reviewed.  Constitutional:      General: She is not in acute distress.    Appearance: She is not ill-appearing.  HENT:     Head: Normocephalic and atraumatic.     Nose: No congestion.  Eyes:     Conjunctiva/sclera: Conjunctivae normal.  Cardiovascular:     Rate and Rhythm: Normal rate and regular rhythm.     Pulses: Normal pulses.  Skin:    General: Skin is warm and dry.  Neurological:     Mental Status: She is alert.  Psychiatric:        Mood and Affect: Mood normal.     Comments: Responding appropriately, does not appear to be respond to internal stimuli, makes good eye contact, has good insight.      ED Results / Procedures / Treatments   Labs (all labs ordered are listed, but only abnormal results are displayed) Labs Reviewed - No data to display  EKG None  Radiology DG Shoulder Left  Result Date: 06/02/2022 CLINICAL DATA:  Left shoulder pain, initial encounter EXAM: LEFT SHOULDER - 2+ VIEW COMPARISON:  None Available. FINDINGS: There is no evidence of fracture or dislocation. There is no evidence of arthropathy or other focal bone abnormality. Soft tissues are unremarkable. IMPRESSION: No acute abnormality noted. Electronically Signed   By: Alcide Clever M.D.   On: 06/02/2022 02:24     Procedures Procedures    Medications Ordered in ED Medications  cyclobenzaprine (FLEXERIL) tablet 5 mg (5 mg Oral Given 06/02/22 0243)    ED Course/ Medical Decision Making/ A&P                             Medical Decision Making Amount and/or Complexity of Data Reviewed Radiology: ordered.  Risk Prescription drug management.   This patient presents to the ED for concern of suicidal ideation, this involves an extensive number of treatment options, and is a complaint that carries with it a high risk of complications and morbidity.  The differential diagnosis includes psychiatric emergency, withdrawals, metabolic derangement    Additional history obtained:  Additional history obtained from n/a External records from outside source obtained and reviewed including psychiatry notes   Co morbidities that complicate  the patient evaluation  Schizophrenia  Social Determinants of Health:  N/A    Lab Tests:  I Ordered, and personally interpreted labs.  The pertinent results include:  n/a   Imaging Studies ordered:  I ordered imaging studies including n/a  I independently visualized and interpreted imaging which showed n/a I agree with the radiologist interpretation   Cardiac Monitoring:  The patient was maintained on a cardiac monitor.  I personally viewed and interpreted the cardiac monitored which showed an underlying rhythm of: n/a   Medicines ordered and prescription drug management:  I ordered medication including Toradol, Robaxin  I have reviewed the patients home medicines and have made adjustments as needed  Critical Interventions:  N/a   Reevaluation:  Presents with suicidal ideations, on my evaluation patient was not actually suicidal, states that she just felt suicidal because she had pain, she states that if her pain was under control she be no longer suicidal.  Patient was given muscle relaxer as well as Toradol, patient was able to contact  for safety and is in agreement with discharge at this time    Consultations Obtained:  N/a    Test Considered:  N/a    Rule out Low suspicion for withdrawals as she is nontraumatic my exam presentation atypical.  I have low suspicion patient needs to be hospitalized for acute psychiatric emergency as patient became suicidal right after being discharged, she states that she felt suicidal because of pain and would no longer be suicidal if her pain was managed.  She does not appear to be responding internal stimuli, she has good insight, patient was just evaluated by psychiatry less than a week ago, presentation appears to be more consistent with secondary gain.  Dispostion and problem list  After consideration of the diagnostic results and the patients response to treatment, I feel that the patent would benefit from discharge.  Suicidal-this is an acute on chronic, suspect there is underlying malingering as well as secondary gain, due to her history of multiple overdoses will provide patient with a lidocaine patch, and refer her to behavioral health urgent care.            Final Clinical Impression(s) / ED Diagnoses Final diagnoses:  Acute pain of left shoulder    Rx / DC Orders ED Discharge Orders     None         Carroll Sage, PA-C 06/02/22 0436    Palumbo, April, MD 06/02/22 [id number]
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
## 2022-06-02 NOTE — Discharge Instructions (Addendum)
You have been seen here for left shoulder pain. I recommend taking over-the-counter pain medications like ibuprofen and/or Tylenol every 6 as needed.  Please follow dosage and on the back of bottle.  I also recommend applying heat to the area and stretching out the muscles as this will help decrease stiffness and pain.  I have given you lidocaine patch please use as prescribed  Please follow-up with behavioral urgent care for further evaluation  Please follow-up with orthopedics for further evaluation  Come back to the emergency department if you develop chest pain, shortness of breath, severe abdominal pain, uncontrolled nausea, vomiting, diarrhea.

## 2022-06-04 ENCOUNTER — Inpatient Hospital Stay (HOSPITAL_COMMUNITY)
Admission: EM | Admit: 2022-06-04 | Discharge: 2022-06-07 | DRG: 918 | Disposition: A | Discharge status: Other Acute Inpatient Hospital | Payer: Medicare PPO | Attending: Internal Medicine | Admitting: Internal Medicine

## 2022-06-04 ENCOUNTER — Encounter (HOSPITAL_COMMUNITY): Payer: Self-pay

## 2022-06-04 ENCOUNTER — Other Ambulatory Visit: Payer: Self-pay

## 2022-06-04 DIAGNOSIS — Z825 Family history of asthma and other chronic lower respiratory diseases: Secondary | ICD-10-CM

## 2022-06-04 DIAGNOSIS — T471X2A Poisoning by other antacids and anti-gastric-secretion drugs, intentional self-harm, initial encounter: Secondary | ICD-10-CM | POA: Diagnosis present

## 2022-06-04 DIAGNOSIS — T447X2A Poisoning by beta-adrenoreceptor antagonists, intentional self-harm, initial encounter: Secondary | ICD-10-CM | POA: Diagnosis present

## 2022-06-04 DIAGNOSIS — Z818 Family history of other mental and behavioral disorders: Secondary | ICD-10-CM

## 2022-06-04 DIAGNOSIS — F411 Generalized anxiety disorder: Secondary | ICD-10-CM | POA: Diagnosis present

## 2022-06-04 DIAGNOSIS — Z91018 Allergy to other foods: Secondary | ICD-10-CM

## 2022-06-04 DIAGNOSIS — Z91013 Allergy to seafood: Secondary | ICD-10-CM

## 2022-06-04 DIAGNOSIS — F419 Anxiety disorder, unspecified: Secondary | ICD-10-CM | POA: Diagnosis present

## 2022-06-04 DIAGNOSIS — F4310 Post-traumatic stress disorder, unspecified: Secondary | ICD-10-CM | POA: Diagnosis present

## 2022-06-04 DIAGNOSIS — J45909 Unspecified asthma, uncomplicated: Secondary | ICD-10-CM | POA: Diagnosis present

## 2022-06-04 DIAGNOSIS — Z79899 Other long term (current) drug therapy: Secondary | ICD-10-CM

## 2022-06-04 DIAGNOSIS — F840 Autistic disorder: Secondary | ICD-10-CM | POA: Diagnosis present

## 2022-06-04 DIAGNOSIS — Z91030 Bee allergy status: Secondary | ICD-10-CM

## 2022-06-04 DIAGNOSIS — D649 Anemia, unspecified: Secondary | ICD-10-CM | POA: Diagnosis present

## 2022-06-04 DIAGNOSIS — Z888 Allergy status to other drugs, medicaments and biological substances status: Secondary | ICD-10-CM

## 2022-06-04 DIAGNOSIS — G4730 Sleep apnea, unspecified: Secondary | ICD-10-CM | POA: Diagnosis present

## 2022-06-04 DIAGNOSIS — Z882 Allergy status to sulfonamides status: Secondary | ICD-10-CM

## 2022-06-04 DIAGNOSIS — T50902A Poisoning by unspecified drugs, medicaments and biological substances, intentional self-harm, initial encounter: Secondary | ICD-10-CM

## 2022-06-04 DIAGNOSIS — Z87891 Personal history of nicotine dependence: Secondary | ICD-10-CM

## 2022-06-04 DIAGNOSIS — T43222A Poisoning by selective serotonin reuptake inhibitors, intentional self-harm, initial encounter: Secondary | ICD-10-CM | POA: Diagnosis present

## 2022-06-04 DIAGNOSIS — T1491XA Suicide attempt, initial encounter: Secondary | ICD-10-CM | POA: Diagnosis present

## 2022-06-04 DIAGNOSIS — T446X2A Poisoning by alpha-adrenoreceptor antagonists, intentional self-harm, initial encounter: Secondary | ICD-10-CM | POA: Diagnosis present

## 2022-06-04 DIAGNOSIS — F250 Schizoaffective disorder, bipolar type: Secondary | ICD-10-CM | POA: Diagnosis present

## 2022-06-04 DIAGNOSIS — T43212A Poisoning by selective serotonin and norepinephrine reuptake inhibitors, intentional self-harm, initial encounter: Principal | ICD-10-CM | POA: Diagnosis present

## 2022-06-04 DIAGNOSIS — Z9151 Personal history of suicidal behavior: Secondary | ICD-10-CM

## 2022-06-04 DIAGNOSIS — T43592A Poisoning by other antipsychotics and neuroleptics, intentional self-harm, initial encounter: Secondary | ICD-10-CM | POA: Diagnosis present

## 2022-06-04 DIAGNOSIS — R569 Unspecified convulsions: Secondary | ICD-10-CM | POA: Diagnosis present

## 2022-06-04 DIAGNOSIS — T481X2A Poisoning by skeletal muscle relaxants [neuromuscular blocking agents], intentional self-harm, initial encounter: Secondary | ICD-10-CM | POA: Diagnosis present

## 2022-06-04 LAB — CBC WITH DIFFERENTIAL/PLATELET
Abs Immature Granulocytes: 0.02 10*3/uL (ref 0.00–0.07)
Basophils Absolute: 0 10*3/uL (ref 0.0–0.1)
Basophils Relative: 0 %
Eosinophils Absolute: 0 10*3/uL (ref 0.0–0.5)
Eosinophils Relative: 1 %
HCT: 34.6 % — ABNORMAL LOW (ref 36.0–46.0)
Hemoglobin: 10.9 g/dL — ABNORMAL LOW (ref 12.0–15.0)
Immature Granulocytes: 0 %
Lymphocytes Relative: 36 %
Lymphs Abs: 2.4 10*3/uL (ref 0.7–4.0)
MCH: 25.2 pg — ABNORMAL LOW (ref 26.0–34.0)
MCHC: 31.5 g/dL (ref 30.0–36.0)
MCV: 80.1 fL (ref 80.0–100.0)
Monocytes Absolute: 0.3 10*3/uL (ref 0.1–1.0)
Monocytes Relative: 5 %
Neutro Abs: 3.8 10*3/uL (ref 1.7–7.7)
Neutrophils Relative %: 58 %
Platelets: 254 10*3/uL (ref 150–400)
RBC: 4.32 MIL/uL (ref 3.87–5.11)
RDW: 14.1 % (ref 11.5–15.5)
WBC: 6.6 10*3/uL (ref 4.0–10.5)
nRBC: 0 % (ref 0.0–0.2)

## 2022-06-04 LAB — CBG MONITORING, ED: Glucose-Capillary: 109 mg/dL — ABNORMAL HIGH (ref 70–99)

## 2022-06-04 MED ORDER — SODIUM CHLORIDE 0.9 % IV BOLUS
1000.0000 mL | Freq: Once | INTRAVENOUS | Status: AC
  Administered 2022-06-04: 1000 mL via INTRAVENOUS

## 2022-06-04 NOTE — ED Triage Notes (Signed)
BIBA from home for drug overdose she took 7 Trazodone, 7 Lexapro, 9 Vraylar, 7 Protonix, 7 flexeril, 25 Vistaril, 7 Zyprexa, 7 Prazosins, 14 Metoprolol in a suicide attempt, has a hx of suicide attempts

## 2022-06-04 NOTE — ED Notes (Addendum)
Pt. In gown and wanded by security. Pt. Belongings locked up in cabinet on the RES A and B. Pt. Has 1 belongings bag and 1 blue book bag. Pt has 1 beige/black purse, 1 short, 1 burgundy bra, and 1 black short and 1 cell phone and 1 black bible.

## 2022-06-05 DIAGNOSIS — Z825 Family history of asthma and other chronic lower respiratory diseases: Secondary | ICD-10-CM | POA: Diagnosis not present

## 2022-06-05 DIAGNOSIS — T447X2A Poisoning by beta-adrenoreceptor antagonists, intentional self-harm, initial encounter: Secondary | ICD-10-CM | POA: Diagnosis present

## 2022-06-05 DIAGNOSIS — Z9151 Personal history of suicidal behavior: Secondary | ICD-10-CM | POA: Diagnosis not present

## 2022-06-05 DIAGNOSIS — Z882 Allergy status to sulfonamides status: Secondary | ICD-10-CM | POA: Diagnosis not present

## 2022-06-05 DIAGNOSIS — F4310 Post-traumatic stress disorder, unspecified: Secondary | ICD-10-CM | POA: Diagnosis present

## 2022-06-05 DIAGNOSIS — F840 Autistic disorder: Secondary | ICD-10-CM | POA: Diagnosis present

## 2022-06-05 DIAGNOSIS — D649 Anemia, unspecified: Secondary | ICD-10-CM | POA: Diagnosis present

## 2022-06-05 DIAGNOSIS — T50902A Poisoning by unspecified drugs, medicaments and biological substances, intentional self-harm, initial encounter: Secondary | ICD-10-CM

## 2022-06-05 DIAGNOSIS — T446X2A Poisoning by alpha-adrenoreceptor antagonists, intentional self-harm, initial encounter: Secondary | ICD-10-CM | POA: Diagnosis present

## 2022-06-05 DIAGNOSIS — T43592A Poisoning by other antipsychotics and neuroleptics, intentional self-harm, initial encounter: Secondary | ICD-10-CM | POA: Diagnosis present

## 2022-06-05 DIAGNOSIS — F411 Generalized anxiety disorder: Secondary | ICD-10-CM | POA: Diagnosis present

## 2022-06-05 DIAGNOSIS — R569 Unspecified convulsions: Secondary | ICD-10-CM | POA: Diagnosis present

## 2022-06-05 DIAGNOSIS — T481X2A Poisoning by skeletal muscle relaxants [neuromuscular blocking agents], intentional self-harm, initial encounter: Secondary | ICD-10-CM | POA: Diagnosis present

## 2022-06-05 DIAGNOSIS — T471X2A Poisoning by other antacids and anti-gastric-secretion drugs, intentional self-harm, initial encounter: Secondary | ICD-10-CM | POA: Diagnosis present

## 2022-06-05 DIAGNOSIS — Z818 Family history of other mental and behavioral disorders: Secondary | ICD-10-CM | POA: Diagnosis not present

## 2022-06-05 DIAGNOSIS — T43212A Poisoning by selective serotonin and norepinephrine reuptake inhibitors, intentional self-harm, initial encounter: Secondary | ICD-10-CM | POA: Diagnosis present

## 2022-06-05 DIAGNOSIS — Z79899 Other long term (current) drug therapy: Secondary | ICD-10-CM | POA: Diagnosis not present

## 2022-06-05 DIAGNOSIS — Z87891 Personal history of nicotine dependence: Secondary | ICD-10-CM | POA: Diagnosis not present

## 2022-06-05 DIAGNOSIS — Z888 Allergy status to other drugs, medicaments and biological substances status: Secondary | ICD-10-CM | POA: Diagnosis not present

## 2022-06-05 DIAGNOSIS — T43222A Poisoning by selective serotonin reuptake inhibitors, intentional self-harm, initial encounter: Secondary | ICD-10-CM | POA: Diagnosis present

## 2022-06-05 DIAGNOSIS — Z91013 Allergy to seafood: Secondary | ICD-10-CM | POA: Diagnosis not present

## 2022-06-05 DIAGNOSIS — J45909 Unspecified asthma, uncomplicated: Secondary | ICD-10-CM | POA: Diagnosis present

## 2022-06-05 DIAGNOSIS — Z91030 Bee allergy status: Secondary | ICD-10-CM | POA: Diagnosis not present

## 2022-06-05 DIAGNOSIS — F250 Schizoaffective disorder, bipolar type: Secondary | ICD-10-CM | POA: Diagnosis present

## 2022-06-05 HISTORY — DX: Poisoning by unspecified drugs, medicaments and biological substances, intentional self-harm, initial encounter: T50.902A

## 2022-06-05 HISTORY — DX: Anemia, unspecified: D64.9

## 2022-06-05 LAB — CBC
HCT: 33.8 % — ABNORMAL LOW (ref 36.0–46.0)
Hemoglobin: 10.5 g/dL — ABNORMAL LOW (ref 12.0–15.0)
MCH: 25.5 pg — ABNORMAL LOW (ref 26.0–34.0)
MCHC: 31.1 g/dL (ref 30.0–36.0)
MCV: 82.2 fL (ref 80.0–100.0)
Platelets: 259 10*3/uL (ref 150–400)
RBC: 4.11 MIL/uL (ref 3.87–5.11)
RDW: 14.5 % (ref 11.5–15.5)
WBC: 7.1 10*3/uL (ref 4.0–10.5)
nRBC: 0 % (ref 0.0–0.2)

## 2022-06-05 LAB — CREATININE, SERUM
Creatinine, Ser: 0.69 mg/dL (ref 0.44–1.00)
GFR, Estimated: >60 mL/min (ref >60)

## 2022-06-05 LAB — IRON AND TIBC
Iron: 35 ug/dL (ref 28–170)
Saturation Ratios: 9 % — ABNORMAL LOW (ref 10.4–31.8)
TIBC: 396 ug/dL (ref 250–450)
UIBC: 361 ug/dL

## 2022-06-05 LAB — I-STAT CHEM 8, ED
BUN: 10 mg/dL (ref 6–20)
Calcium, Ion: 1.24 mmol/L (ref 1.15–1.40)
Chloride: 106 mmol/L (ref 98–111)
Creatinine, Ser: 0.7 mg/dL (ref 0.44–1.00)
Glucose, Bld: 107 mg/dL — ABNORMAL HIGH (ref 70–99)
HCT: 32 % — ABNORMAL LOW (ref 36.0–46.0)
Hemoglobin: 10.9 g/dL — ABNORMAL LOW (ref 12.0–15.0)
Potassium: 3.5 mmol/L (ref 3.5–5.1)
Sodium: 142 mmol/L (ref 135–145)
TCO2: 26 mmol/L (ref 22–32)

## 2022-06-05 LAB — COMPREHENSIVE METABOLIC PANEL
ALT: 15 U/L (ref 0–44)
AST: 19 U/L (ref 15–41)
Albumin: 3.7 g/dL (ref 3.5–5.0)
Alkaline Phosphatase: 68 U/L (ref 38–126)
Anion gap: 7 (ref 5–15)
BUN: 10 mg/dL (ref 6–20)
CO2: 23 mmol/L (ref 22–32)
Calcium: 8.7 mg/dL — ABNORMAL LOW (ref 8.9–10.3)
Chloride: 108 mmol/L (ref 98–111)
Creatinine, Ser: 0.79 mg/dL (ref 0.44–1.00)
GFR, Estimated: >60 mL/min (ref >60)
Glucose, Bld: 119 mg/dL — ABNORMAL HIGH (ref 70–99)
Potassium: 3.3 mmol/L — ABNORMAL LOW (ref 3.5–5.1)
Sodium: 138 mmol/L (ref 135–145)
Total Bilirubin: 0.5 mg/dL (ref 0.3–1.2)
Total Protein: 7.2 g/dL (ref 6.5–8.1)

## 2022-06-05 LAB — RAPID URINE DRUG SCREEN, HOSP PERFORMED
Amphetamines: NOT DETECTED
Barbiturates: NOT DETECTED
Benzodiazepines: NOT DETECTED
Cocaine: NOT DETECTED
Opiates: NOT DETECTED
Tetrahydrocannabinol: NOT DETECTED

## 2022-06-05 LAB — RETICULOCYTES
Immature Retic Fract: 13.6 % (ref 2.3–15.9)
RBC.: 4.14 MIL/uL (ref 3.87–5.11)
Retic Count, Absolute: 65 10*3/uL (ref 19.0–186.0)
Retic Ct Pct: 1.6 % (ref 0.4–3.1)

## 2022-06-05 LAB — I-STAT BETA HCG BLOOD, ED (MC, WL, AP ONLY): I-stat hCG, quantitative: <5 m[IU]/mL (ref <5)

## 2022-06-05 LAB — FOLATE: Folate: 13.2 ng/mL (ref >5.9)

## 2022-06-05 LAB — ETHANOL: Alcohol, Ethyl (B): <10 mg/dL (ref <10)

## 2022-06-05 LAB — VITAMIN B12: Vitamin B-12: 3066 pg/mL — ABNORMAL HIGH (ref 180–914)

## 2022-06-05 LAB — ACETAMINOPHEN LEVEL
Acetaminophen (Tylenol), Serum: <10 ug/mL — ABNORMAL LOW (ref 10–30)
Acetaminophen (Tylenol), Serum: <10 ug/mL — ABNORMAL LOW (ref 10–30)

## 2022-06-05 LAB — SALICYLATE LEVEL: Salicylate Lvl: <7 mg/dL — ABNORMAL LOW (ref 7.0–30.0)

## 2022-06-05 LAB — FERRITIN: Ferritin: 12 ng/mL (ref 11–307)

## 2022-06-05 LAB — MAGNESIUM: Magnesium: 2.1 mg/dL (ref 1.7–2.4)

## 2022-06-05 MED ORDER — POTASSIUM CHLORIDE 10 MEQ/100ML IV SOLN
10.0000 meq | Freq: Once | INTRAVENOUS | Status: AC
  Administered 2022-06-05: 10 meq via INTRAVENOUS
  Filled 2022-06-05: qty 100

## 2022-06-05 MED ORDER — ALBUTEROL SULFATE (2.5 MG/3ML) 0.083% IN NEBU: 2.5000 mg | INHALATION_SOLUTION | RESPIRATORY_TRACT | Status: DC | PRN

## 2022-06-05 MED ORDER — TRAZODONE HCL 50 MG PO TABS
25.0000 mg | ORAL_TABLET | Freq: Every evening | ORAL | Status: DC | PRN
  Administered 2022-06-05 – 2022-06-06 (×2): 25 mg via ORAL
  Filled 2022-06-05 (×2): qty 1

## 2022-06-05 MED ORDER — CYCLOBENZAPRINE HCL 10 MG PO TABS
10.0000 mg | ORAL_TABLET | Freq: Every day | ORAL | Status: DC
  Administered 2022-06-05 – 2022-06-07 (×3): 10 mg via ORAL
  Filled 2022-06-05 (×3): qty 1

## 2022-06-05 MED ORDER — PANTOPRAZOLE SODIUM 40 MG PO TBEC
40.0000 mg | DELAYED_RELEASE_TABLET | Freq: Every day | ORAL | Status: DC
  Administered 2022-06-06 – 2022-06-07 (×2): 40 mg via ORAL
  Filled 2022-06-05 (×2): qty 1

## 2022-06-05 MED ORDER — ONDANSETRON HCL 4 MG/2ML IJ SOLN
4.0000 mg | Freq: Four times a day (QID) | INTRAMUSCULAR | Status: DC | PRN
  Administered 2022-06-05 – 2022-06-06 (×2): 4 mg via INTRAVENOUS
  Filled 2022-06-05 (×2): qty 2

## 2022-06-05 MED ORDER — ONDANSETRON HCL 4 MG PO TABS: 4.0000 mg | ORAL_TABLET | Freq: Four times a day (QID) | ORAL | Status: DC | PRN

## 2022-06-05 MED ORDER — POTASSIUM CHLORIDE 10 MEQ/100ML IV SOLN
10.0000 meq | INTRAVENOUS | Status: AC
  Administered 2022-06-05 (×3): 10 meq via INTRAVENOUS
  Filled 2022-06-05 (×3): qty 100

## 2022-06-05 MED ORDER — GLUCAGON HCL RDNA (DIAGNOSTIC) 1 MG IJ SOLR
2.0000 mg | Freq: Once | INTRAMUSCULAR | Status: AC
  Administered 2022-06-05: 2 mg via INTRAVENOUS

## 2022-06-05 MED ORDER — SODIUM CHLORIDE 0.9 % IV BOLUS
1000.0000 mL | Freq: Once | INTRAVENOUS | Status: AC
  Administered 2022-06-05: 1000 mL via INTRAVENOUS

## 2022-06-05 MED ORDER — GLUCAGON HCL RDNA (DIAGNOSTIC) 1 MG IJ SOLR
1.0000 mg | Freq: Once | INTRAMUSCULAR | Status: DC
  Filled 2022-06-05: qty 1

## 2022-06-05 MED ORDER — MOMETASONE FURO-FORMOTEROL FUM 200-5 MCG/ACT IN AERO
2.0000 | INHALATION_SPRAY | Freq: Two times a day (BID) | RESPIRATORY_TRACT | Status: DC
  Administered 2022-06-05 – 2022-06-07 (×4): 2 via RESPIRATORY_TRACT
  Filled 2022-06-05: qty 8.8

## 2022-06-05 MED ORDER — LIDOCAINE 5 % EX PTCH
1.0000 | MEDICATED_PATCH | CUTANEOUS | Status: DC
  Filled 2022-06-05 (×2): qty 1

## 2022-06-05 MED ORDER — IBUPROFEN 200 MG PO TABS
400.0000 mg | ORAL_TABLET | Freq: Four times a day (QID) | ORAL | Status: DC | PRN
  Administered 2022-06-05 – 2022-06-06 (×2): 400 mg via ORAL
  Filled 2022-06-05 (×2): qty 2

## 2022-06-05 MED ORDER — ENOXAPARIN SODIUM 40 MG/0.4ML IJ SOSY
40.0000 mg | PREFILLED_SYRINGE | INTRAMUSCULAR | Status: DC
  Filled 2022-06-05: qty 0.4

## 2022-06-05 NOTE — ED Notes (Signed)
ED TO INPATIENT HANDOFF REPORT  ED Nurse Name and Phone #: [name] Name/Age/Gender [name] 32 y.o. female Room/Bed: RESA/RESA  Code Status   Code Status: Full Code  Home/SNF/Other Home Patient oriented to: self, place, time, and situation Is this baseline? Yes   Triage Complete: Triage complete  Chief Complaint Intentional drug overdose (HCC) [T50.902A]  Triage Note BIBA from home for drug overdose she took 7 Trazodone, 7 Lexapro, 9 Vraylar, 7 Protonix, 7 flexeril, 25 Vistaril, 7 Zyprexa, 7 Prazosins, 14 Metoprolol in a suicide attempt, has a hx of suicide attempts   Allergies Allergies  Allergen Reactions   Bee Venom Anaphylaxis   Coconut Flavor Anaphylaxis and Rash   Fish Allergy Anaphylaxis   Geodon [Ziprasidone Hcl] Other (See Comments)    Pt states that this medication causes paralysis of the mouth.     Haloperidol And Related Other (See Comments)    Pt states that this medication causes paralysis of the mouth, jaw locks up   Lithobid [Lithium] Other (See Comments)    Seizure-like activity    Roxicodone [Oxycodone] Other (See Comments)    Hallucinations    Seroquel [Quetiapine] Other (See Comments)    Severe drowsiness Pt currently taking 12.5 mg BID, she is ok with this dose   Shellfish Allergy Anaphylaxis   Phenergan [Promethazine Hcl] Other (See Comments)    Chest pain     Prilosec [Omeprazole] Nausea And Vomiting    Pt can take protonix with no problems    Sulfa Antibiotics Other (See Comments)    Chest pain    Tegretol [Carbamazepine] Nausea And Vomiting   Prozac [Fluoxetine] Other (See Comments)    Increased Depression Suicidal thoughts   Tape Other (See Comments)    Skin tears, can only tolerate paper tape.   Tylenol [Acetaminophen] Rash and Other (See Comments)    Rash on face     Level of Care/Admitting Diagnosis ED Disposition     ED Disposition  Admit   Condition  --   Comment  Hospital Area: [facility] [[id number]]  Level of Care: Telemetry [5]  Admit to tele based on following criteria: Monitor QTC interval  May place patient in observation at [facility] or [facility] if equivalent level of care is available:: Yes  Covid Evaluation: Asymptomatic - no recent exposure (last 10 days) testing not required  Diagnosis: Intentional drug overdose [facility]) [[id number]]  Admitting Physician: Maryln Gottron [[id number]]  Attending Physician: Kirby Crigler, MIR [email or web address] [[id number]]          B Medical/Surgery History Past Medical History:  Diagnosis Date   Acid reflux    Anxiety    Asthma    last attack 03/13/15 or 03/14/15   Autism    Carrier of fragile X syndrome    Chronic constipation    Depression    Drug-seeking behavior    Essential tremor    Headache    Overdose of acetaminophen 07/2017   and other meds   Personality disorder (HCC)    Schizo-affective psychosis (HCC)    Schizoaffective disorder, bipolar type (HCC)    Seizures (HCC)    Last seizure December 2017   Sleep apnea    Past Surgical History:  Procedure Laterality Date   MOUTH SURGERY  2009 or 2010     A IV Location/Drains/Wounds Patient Lines/Drains/Airways Status     Active Line/Drains/Airways     Name Placement date Placement time Site Days   Peripheral IV 06/04/22  18 G Left Antecubital 06/04/22  2330  Antecubital  1            Intake/Output Last 24 hours  Intake/Output Summary (Last 24 hours) at 06/05/2022 4696 Last data filed at 06/05/2022 0219 Gross per 24 hour  Intake 1100 ml  Output --  Net 1100 ml    Labs/Imaging Results for orders placed or performed during the hospital encounter of 06/04/22 (from the past 48 hour(s))  Comprehensive metabolic panel     Status: Abnormal   Collection Time: 06/04/22 11:32 PM  Result Value Ref Range   Sodium 138 135 - 145 mmol/L   Potassium 3.3 (L) 3.5 - 5.1 mmol/L   Chloride 108 98 - 111 mmol/L   CO2 23 22 - 32 mmol/L   Glucose, Bld 119 (H) 70 - 99 mg/dL     Comment: Glucose reference range applies only to samples taken after fasting for at least 8 hours.   BUN 10 6 - 20 mg/dL   Creatinine, Ser 2.95 0.44 - 1.00 mg/dL   Calcium 8.7 (L) 8.9 - 10.3 mg/dL   Total Protein 7.2 6.5 - 8.1 g/dL   Albumin 3.7 3.5 - 5.0 g/dL   AST 19 15 - 41 U/L   ALT 15 0 - 44 U/L   Alkaline Phosphatase 68 38 - 126 U/L   Total Bilirubin 0.5 0.3 - 1.2 mg/dL   GFR, Estimated >28 >41 mL/min    Comment: (NOTE) Calculated using the CKD-EPI Creatinine Equation (2021)    Anion gap 7 5 - 15    Comment: Performed at [facility], [address], [place], [place] [address]  Salicylate level     Status: Abnormal   Collection Time: 06/04/22 11:32 PM  Result Value Ref Range   Salicylate Lvl <7.0 (L) 7.0 - 30.0 mg/dL    Comment: Performed at [facility], [address] [place], [place] [address]  Acetaminophen level     Status: Abnormal   Collection Time: 06/04/22 11:32 PM  Result Value Ref Range   Acetaminophen (Tylenol), Serum <10 (L) 10 - 30 ug/mL    Comment: (NOTE) Therapeutic concentrations vary significantly. A range of 10-30 ug/mL  may be an effective concentration for many patients. However, some  are best treated at concentrations outside of this range. Acetaminophen concentrations >150 ug/mL at 4 hours after ingestion  and >50 ug/mL at 12 hours after ingestion are often associated with  toxic reactions.  Performed at [facility], [address] [place], [place] [address]   Ethanol     Status: None   Collection Time: 06/04/22 11:32 PM  Result Value Ref Range   Alcohol, Ethyl (B) <10 <10 mg/dL    Comment: (NOTE) Lowest detectable limit for serum alcohol is 10 mg/dL.  For medical purposes only. Performed at [facility], [address], [place], [place] [address]   CBC WITH DIFFERENTIAL     Status: Abnormal   Collection Time: 06/04/22 11:32 PM  Result Value Ref Range   WBC  6.6 4.0 - 10.5 K/uL   RBC 4.32 3.87 - 5.11 MIL/uL   Hemoglobin 10.9 (L) 12.0 - 15.0 g/dL   HCT 42.5 (L) 95.6 - 38.7 %   MCV 80.1 80.0 - 100.0 fL   MCH 25.2 (L) 26.0 - 34.0 pg   MCHC 31.5 30.0 - 36.0 g/dL   RDW 56.4 33.2 - 95.1 %   Platelets 254 150 - 400 K/uL  nRBC 0.0 0.0 - 0.2 %   Neutrophils Relative % 58 %   Neutro Abs 3.8 1.7 - 7.7 K/uL   Lymphocytes Relative 36 %   Lymphs Abs 2.4 0.7 - 4.0 K/uL   Monocytes Relative 5 %   Monocytes Absolute 0.3 0.1 - 1.0 K/uL   Eosinophils Relative 1 %   Eosinophils Absolute 0.0 0.0 - 0.5 K/uL   Basophils Relative 0 %   Basophils Absolute 0.0 0.0 - 0.1 K/uL   Immature Granulocytes 0 %   Abs Immature Granulocytes 0.02 0.00 - 0.07 K/uL    Comment: Performed at [facility], [address] [place], [place] [address]  Magnesium     Status: None   Collection Time: 06/04/22 11:32 PM  Result Value Ref Range   Magnesium 2.1 1.7 - 2.4 mg/dL    Comment: Performed at [facility], [address] [name], [place] [address]  CBG monitoring, ED     Status: Abnormal   Collection Time: 06/04/22 11:39 PM  Result Value Ref Range   Glucose-Capillary 109 (H) 70 - 99 mg/dL    Comment: Glucose reference range applies only to samples taken after fasting for at least 8 hours.  Acetaminophen level     Status: Abnormal   Collection Time: 06/05/22  1:10 AM  Result Value Ref Range   Acetaminophen (Tylenol), Serum <10 (L) 10 - 30 ug/mL    Comment: (NOTE) Therapeutic concentrations vary significantly. A range of 10-30 ug/mL  may be an effective concentration for many patients. However, some  are best treated at concentrations outside of this range. Acetaminophen concentrations >150 ug/mL at 4 hours after ingestion  and >50 ug/mL at 12 hours after ingestion are often associated with  toxic reactions.  Performed at [facility], [address] [place], [place] [address]   Urine rapid drug screen (hosp  performed)     Status: None   Collection Time: 06/05/22  3:26 AM  Result Value Ref Range   Opiates NONE DETECTED NONE DETECTED   Cocaine NONE DETECTED NONE DETECTED   Benzodiazepines NONE DETECTED NONE DETECTED   Amphetamines NONE DETECTED NONE DETECTED   Tetrahydrocannabinol NONE DETECTED NONE DETECTED   Barbiturates NONE DETECTED NONE DETECTED    Comment: (NOTE) DRUG SCREEN FOR MEDICAL PURPOSES ONLY.  IF CONFIRMATION IS NEEDED FOR ANY PURPOSE, NOTIFY LAB WITHIN 5 DAYS.  LOWEST DETECTABLE LIMITS FOR URINE DRUG SCREEN Drug Class                     Cutoff (ng/mL) Amphetamine and metabolites    1000 Barbiturate and metabolites    200 Benzodiazepine                 200 Opiates and metabolites        300 Cocaine and metabolites        300 THC                            50 Performed at [facility], [address] [place], [place] [address]   I-Stat beta hCG blood, ED     Status: None   Collection Time: 06/05/22  3:31 AM  Result Value Ref Range   I-stat hCG, quantitative <5.0 <5 mIU/mL   Comment 3            Comment:   GEST. AGE      CONC.  (  mIU/mL)   <=1 WEEK        5 - 50     2 WEEKS       50 - 500     3 WEEKS       100 - 10,000     4 WEEKS     1,000 - 30,000        FEMALE AND NON-PREGNANT FEMALE:     LESS THAN 5 mIU/mL   I-stat chem 8, ED (not at [facility], [facility] or [facility])     Status: Abnormal   Collection Time: 06/05/22  3:31 AM  Result Value Ref Range   Sodium 142 135 - 145 mmol/L   Potassium 3.5 3.5 - 5.1 mmol/L   Chloride 106 98 - 111 mmol/L   BUN 10 6 - 20 mg/dL   Creatinine, Ser 4.09 0.44 - 1.00 mg/dL   Glucose, Bld 811 (H) 70 - 99 mg/dL    Comment: Glucose reference range applies only to samples taken after fasting for at least 8 hours.   Calcium, Ion 1.24 1.15 - 1.40 mmol/L   TCO2 26 22 - 32 mmol/L   Hemoglobin 10.9 (L) 12.0 - 15.0 g/dL   HCT 91.4 (L) 78.2 - 95.6 %   No results found.  Pending Labs Unresulted Labs (From admission, onward)      Start     Ordered   06/12/22 0500  Creatinine, serum  (enoxaparin (LOVENOX)    CrCl >/= 30 ml/min)  Weekly,   R     Comments: while on enoxaparin therapy    06/05/22 0735   06/05/22 0741  Vitamin B12  (Anemia Panel (PNL))  Once,   R        06/05/22 0740   06/05/22 0741  Folate  (Anemia Panel (PNL))  Once,   R        06/05/22 0740   06/05/22 0741  Iron and TIBC  (Anemia Panel (PNL))  Once,   R        06/05/22 0740   06/05/22 0741  Ferritin  (Anemia Panel (PNL))  Once,   R        06/05/22 0740   06/05/22 0741  Reticulocytes  (Anemia Panel (PNL))  Once,   R        06/05/22 0740   06/05/22 0734  CBC  (enoxaparin (LOVENOX)    CrCl >/= 30 ml/min)  Once,   R       Comments: Baseline for enoxaparin therapy IF NOT ALREADY DRAWN.  Notify MD if PLT < 100 K.    06/05/22 0735   06/05/22 0734  Creatinine, serum  (enoxaparin (LOVENOX)    CrCl >/= 30 ml/min)  Once,   R       Comments: Baseline for enoxaparin therapy IF NOT ALREADY DRAWN.    06/05/22 0735            Vitals/Pain Today's Vitals   06/05/22 0245 06/05/22 0300 06/05/22 0430 06/05/22 0600  BP: 110/64 105/63 112/83 107/70  Pulse: 67 70 77 63  Resp: 15 15 16 16   Temp:      TempSrc:      SpO2: 98% 90% 97% 95%  Weight:      Height:        Isolation Precautions No active isolations  Medications Medications  cyclobenzaprine (FLEXERIL) tablet 10 mg (has no administration in time range)  mometasone-formoterol (DULERA) 200-5 MCG/ACT inhaler 2 puff (2 puffs Inhalation Not Given 06/05/22 0816)  lidocaine 4 %  1 patch (has no administration in time range)  pantoprazole (PROTONIX) EC tablet 40 mg (has no administration in time range)  enoxaparin (LOVENOX) injection 40 mg (has no administration in time range)  ibuprofen (ADVIL) tablet 400 mg (has no administration in time range)  traZODone (DESYREL) tablet 25 mg (has no administration in time range)  ondansetron (ZOFRAN) tablet 4 mg (has no administration in time range)    Or   ondansetron (ZOFRAN) injection 4 mg (has no administration in time range)  albuterol (PROVENTIL) (2.5 MG/3ML) 0.083% nebulizer solution 2.5 mg (has no administration in time range)  sodium chloride 0.9 % bolus 1,000 mL (0 mLs Intravenous Stopped 06/05/22 0127)  potassium chloride 10 mEq in 100 mL IVPB (0 mEq Intravenous Stopped 06/05/22 0543)  potassium chloride 10 mEq in 100 mL IVPB (0 mEq Intravenous Stopped 06/05/22 0219)  sodium chloride 0.9 % bolus 1,000 mL (0 mLs Intravenous Stopped 06/05/22 0543)  sodium chloride 0.9 % bolus 1,000 mL (0 mLs Intravenous Stopped 06/05/22 0543)  glucagon (human recombinant) (GLUCAGEN) injection 2 mg (2 mg Intravenous Given 06/05/22 0218)    Mobility walks     Focused Assessments    R Recommendations: See Admitting Provider Note  Report given to:   Additional Notes:

## 2022-06-05 NOTE — Plan of Care (Signed)

## 2022-06-05 NOTE — ED Provider Notes (Signed)
[place] EMERGENCY DEPARTMENT AT [facility] Provider Note   CSN: [id number] Arrival date & time: 06/04/22  2311     History  No chief complaint on file.   [name] is a 32 y.o. female with past medical history significant for borderline personality, schizoaffective disorder, suicidal ideation, PTSD, asthma, migraines, previous intentional overdose who presents with concern for intentional overdose tonight.  Patient reports that she took 7 trazodone, 7 Lexapro, 9 Vraylar, 7 Protonix, 7 Flexeril, 25 Vistaril, 7 Zyprexa, 7 prazosin, 14 metoprolol XL. Reports she feels sleepy. Endorses ongoing suicidal ideation  HPI     Home Medications Prior to Admission medications   Medication Sig Start Date End Date Taking? Authorizing Provider  albuterol (VENTOLIN HFA) 108 (90 Base) MCG/ACT inhaler Inhale 2 puffs into the lungs every 6 (six) hours as needed for wheezing or shortness of breath.    [provider]  BREYNA 160-4.5 MCG/ACT inhaler Inhale 1 puff into the lungs in the morning and at bedtime. 04/18/22   [provider]  CORLANOR 5 MG TABS tablet Take 5 mg by mouth 2 (two) times daily. 05/14/22   [provider]  Cyanocobalamin (VITAMIN B-12 PO) Take 1 capsule by mouth in the morning.    [provider]  cyclobenzaprine (FLEXERIL) 10 MG tablet Take 10 mg by mouth at bedtime. 05/10/22   [provider]  diclofenac Sodium (VOLTAREN) 1 % GEL Apply 1 Application topically 4 (four) times daily.    [provider]  escitalopram (LEXAPRO) 10 MG tablet Take 1 tablet (10 mg total) by mouth daily for 14 days. 02/07/22 05/26/22  Massengill, Harrold Donath, MD  hydrOXYzine (ATARAX) 25 MG tablet Take 25 mg twice daily by mouth as needed for anxiety. Take 100 mg by mouth for sleep as needed at bedtime. Patient taking differently: Take 25 mg by mouth daily. 04/30/22   Massengill, Harrold Donath, MD  lidocaine (HM LIDOCAINE PATCH) 4 % Place 1 patch  onto the skin daily. Apply to the affected area 06/02/22   Carroll Sage, PA-C  Melatonin 3 MG CAPS Take 3 mg by mouth at bedtime.    [provider]  metoprolol succinate (TOPROL-XL) 25 MG 24 hr tablet Take 1 tablet (25 mg total) by mouth daily. Patient not taking: Reported on 05/19/2022 05/17/22   Rankin, Shuvon B, NP  OLANZapine (ZYPREXA) 5 MG tablet Take 1 tablet (5 mg total) by mouth daily as needed for up to 7 days (anxiety). Patient taking differently: Take 5 mg by mouth at bedtime. 04/30/22 05/26/22  Massengill, Harrold Donath, MD  pantoprazole (PROTONIX) 40 MG tablet Take 1 tablet (40 mg total) by mouth daily for 14 days. 02/07/22 05/26/22  Massengill, Harrold Donath, MD  prazosin (MINIPRESS) 1 MG capsule Take 1 capsule (1 mg total) by mouth at bedtime for 14 days. 02/06/22 05/26/22  Massengill, Harrold Donath, MD  pregabalin (LYRICA) 100 MG capsule Take 100 mg by mouth 3 (three) times daily.    [provider]  traZODone (DESYREL) 50 MG tablet Take 1 tablet (50 mg total) by mouth at bedtime as needed for up to 7 days for sleep. Patient taking differently: Take 50 mg by mouth at bedtime. 04/30/22 05/26/22  Massengill, Harrold Donath, MD      Allergies    Bee venom, Coconut flavor, Fish allergy, Geodon [ziprasidone hcl], Haloperidol and related, Lithobid [lithium], Roxicodone [oxycodone], Seroquel [quetiapine], Shellfish allergy, Phenergan [promethazine hcl], Prilosec [omeprazole], Sulfa antibiotics, Tegretol [carbamazepine], Prozac [fluoxetine], Tape, and Tylenol [acetaminophen]  Review of Systems   Review of Systems  All other systems reviewed and are negative.   Physical Exam Updated Vital Signs BP 105/63   Pulse 70   Temp 98.5 F (36.9 C) (Oral)   Resp 15   Ht 5\' 4"  (1.626 m)   Wt 95.3 kg   LMP 04/25/2022 (Approximate) Comment: per pt, not pregnant  SpO2 90%   BMI 36.06 kg/m  Physical Exam Vitals and nursing note reviewed.  Constitutional:      General: She is not in acute distress.     Appearance: Normal appearance.  HENT:     Head: Normocephalic and atraumatic.  Eyes:     General:        Right eye: No discharge.        Left eye: No discharge.  Cardiovascular:     Rate and Rhythm: Regular rhythm. Bradycardia present.     Heart sounds: No murmur heard.    No friction rub. No gallop.  Pulmonary:     Effort: Pulmonary effort is normal.     Breath sounds: Normal breath sounds.  Abdominal:     General: Bowel sounds are normal.     Palpations: Abdomen is soft.  Skin:    General: Skin is warm and dry.     Capillary Refill: Capillary refill takes less than 2 seconds.  Neurological:     Mental Status: She is alert and oriented to person, place, and time.  Psychiatric:     Comments: SI, without HI, AVH Answers questions overall appropriately, no tangential speech     ED Results / Procedures / Treatments   Labs (all labs ordered are listed, but only abnormal results are displayed) Labs Reviewed  COMPREHENSIVE METABOLIC PANEL - Abnormal; Notable for the following components:      Result Value   Potassium 3.3 (*)    Glucose, Bld 119 (*)    Calcium 8.7 (*)    All other components within normal limits  SALICYLATE LEVEL - Abnormal; Notable for the following components:   Salicylate Lvl <7.0 (*)    All other components within normal limits  ACETAMINOPHEN LEVEL - Abnormal; Notable for the following components:   Acetaminophen (Tylenol), Serum <10 (*)    All other components within normal limits  CBC WITH DIFFERENTIAL/PLATELET - Abnormal; Notable for the following components:   Hemoglobin 10.9 (*)    HCT 34.6 (*)    MCH 25.2 (*)    All other components within normal limits  ACETAMINOPHEN LEVEL - Abnormal; Notable for the following components:   Acetaminophen (Tylenol), Serum <10 (*)    All other components within normal limits  CBG MONITORING, ED - Abnormal; Notable for the following components:   Glucose-Capillary 109 (*)    All other components within normal  limits  I-STAT CHEM 8, ED - Abnormal; Notable for the following components:   Glucose, Bld 107 (*)    Hemoglobin 10.9 (*)    HCT 32.0 (*)    All other components within normal limits  ETHANOL  RAPID URINE DRUG SCREEN, HOSP PERFORMED  MAGNESIUM  I-STAT BETA HCG BLOOD, ED ([facility], [facility], [facility] ONLY)    EKG EKG Interpretation  Date/Time:  Monday June 04 2022 23:30:32 EDT Ventricular Rate:  85 PR Interval:  146 QRS Duration: 97 QT Interval:  403 QTC Calculation: 480 R Axis:   153 Text Interpretation: Right and left arm electrode reversal, interpretation assumes no reversal Sinus rhythm Confirmed by Nicanor Alcon, April ([id number]) on 06/05/2022 12:31:38 AM  Radiology No results found.  Procedures .Critical Care  Performed by: Olene Floss, PA-C Authorized by: Olene Floss, PA-C   Critical care provider statement:    Critical care time (minutes):  35   Critical care was necessary to treat or prevent imminent or life-threatening deterioration of the following conditions:  Cardiac failure (hypotension, bradycardia 2/2 overdose on beta blockers)   Critical care was time spent personally by me on the following activities:  Development of treatment plan with patient or surrogate, discussions with consultants, evaluation of patient's response to treatment, examination of patient, ordering and review of laboratory studies, ordering and review of radiographic studies, ordering and performing treatments and interventions, pulse oximetry, re-evaluation of patient's condition and review of old charts   Care discussed with: admitting provider       Medications Ordered in ED Medications  potassium chloride 10 mEq in 100 mL IVPB (10 mEq Intravenous New Bag/Given 06/05/22 0319)  sodium chloride 0.9 % bolus 1,000 mL (0 mLs Intravenous Stopped 06/05/22 0127)  potassium chloride 10 mEq in 100 mL IVPB (0 mEq Intravenous Stopped 06/05/22 0219)  sodium chloride 0.9 % bolus 1,000 mL (1,000 mLs  Intravenous New Bag/Given 06/05/22 0128)  sodium chloride 0.9 % bolus 1,000 mL (1,000 mLs Intravenous New Bag/Given 06/05/22 0149)  glucagon (human recombinant) (GLUCAGEN) injection 2 mg (2 mg Intravenous Given 06/05/22 4098)    ED Course/ Medical Decision Making/ A&P Clinical Course as of 06/05/22 0416  Mon Jun 04, 2022  2335 24 hr obs  [CP]  2336 Bradycardia atropine PRN Bolus, then levophed as needed [CP]  2337 CNS depression, possible seizures [CP]    Clinical Course User Index [CP] Olene Floss, PA-C                             Medical Decision Making Amount and/or Complexity of Data Reviewed Labs: ordered.  Risk Prescription drug management.   This patient is a 32 y.o. female who presents to the ED for concern of overdose, this involves an extensive number of treatment options, and is a complaint that carries with it a high risk of complications and morbidity. The emergent differential diagnosis prior to evaluation includes, but is not limited to, serotonin syndrome, acute CNS depression, bradycardia, hypotension, GI upset, cardiac failure, respiratory failure, death. This is not an exhaustive differential.   Past Medical History / Co-morbidities / Social History: borderline personality, schizoaffective disorder, suicidal ideation, PTSD, asthma, migraines, previous intentional overdose   Additional history: Chart reviewed. Pertinent results include: Reviewed lab work, imaging from previous emergency department evaluations  Physical Exam: Physical exam performed. The pertinent findings include: patient with some   Lab Tests: I ordered, and personally interpreted labs.  The pertinent results include:  hypokalemia, potassium 3.3, CBC unremarkable other than mild anemia HGB 10.9   Cardiac Monitoring:  The patient was maintained on a cardiac monitor.  My attending physician Dr. Nicanor Alcon viewed and interpreted the cardiac monitored which showed an underlying rhythm of:  NSR. I agree with this interpretation.   Medications: I ordered medication including potassium for hypokalemia, glucagon for beta-blocker overdose, fluid bolus for hypotension. Reevaluation of the patient after these medicines showed that the patient improved.  At time of call for admission patient is with several consecutive readings with no ongoing hypotension, or bradycardia, she is easily arousable, and protecting airway  Consultations Obtained: I requested consultation with the hospitalist, spoke with Dr. Julian Reil,  and  discussed lab and imaging findings as well as pertinent plan - they recommend: Admission for 24-hour monitoring secondary to extended release metoprolol, overdose, she will need psych consult secondary to suicidal ideation once she is medically cleared   Disposition: After consideration of the diagnostic results and the patients response to treatment, I feel that patient would benefit from admission for overdose on beta-blocker and other substances with need for extensive monitoring   I discussed this case with my attending physician Dr. Nicanor Alcon who cosigned this note including patient's presenting symptoms, physical exam, and planned diagnostics and interventions. Attending physician stated agreement with plan or made changes to plan which were implemented.     Final Clinical Impression(s) / ED Diagnoses Final diagnoses:  Intentional overdose, initial encounter [facility])    Rx / DC Orders ED Discharge Orders     None         [place] 06/05/22 0416    Palumbo, April, MD 06/05/22 0451

## 2022-06-05 NOTE — ED Notes (Signed)
Christian Prosperi, PA notified that pts HR dropped in to 40s and SBP in the 90s.

## 2022-06-05 NOTE — H&P (Signed)
History and Physical  [name] ZOX:[id number] DOB: [birth year] DOA: 06/04/2022  PCP: [name]   Chief Complaint: Intentional drug overdose  HPI: [name] is a 32 y.o. female with Ward history significant for generalized anxiety, PTSD, schizoaffective disorder, severe bipolar 1 disorder, and multiple suicide attempts who presented to the ER for the 16th time this year with complaints of intentional drug overdose.  Reports that she took 7 trazodone, 7 Lexapro, 9 Vraylar, 7 Protonix, 7 Flexeril, 25 Vistaril, 7 Zyprexa, 7 prazosin, 14 metoprolol XL and her main complaint is that she feels sleepy.  On review of systems, she tells me that she has a little bit of right-sided abdominal pain, and associated nausea.  Has no complaints otherwise.  She is being monitored in the ER, was given a dose of glucagon when her heart rate dropped to 40.  Review of Systems: Please see HPI for pertinent positives and negatives. A complete 10 system review of systems are otherwise negative.  Past Ward History:  Diagnosis Date   Acid reflux    Anxiety    Asthma    last attack 03/13/15 or 03/14/15   Autism    Carrier of fragile X syndrome    Chronic constipation    Depression    Drug-seeking behavior    Essential tremor    Headache    Overdose of acetaminophen 07/2017   and other meds   Personality disorder (HCC)    Schizo-affective psychosis (HCC)    Schizoaffective disorder, bipolar type (HCC)    Seizures (HCC)    Last seizure December 2017   Sleep apnea    Past Surgical History:  Procedure Laterality Date   MOUTH SURGERY  2009 or 2010    Social History:  reports that she has quit smoking. Her smoking use included cigarettes. She has never used smokeless tobacco. She reports that she does not drink alcohol and does not use drugs.   Allergies  Allergen Reactions   Bee Venom Anaphylaxis   Coconut Flavor Anaphylaxis and Rash   Fish Allergy Anaphylaxis    Geodon [Ziprasidone Hcl] Other (See Comments)    Pt states that this medication causes paralysis of the mouth.     Haloperidol And Related Other (See Comments)    Pt states that this medication causes paralysis of the mouth, jaw locks up   Lithobid [Lithium] Other (See Comments)    Seizure-like activity    Roxicodone [Oxycodone] Other (See Comments)    Hallucinations    Seroquel [Quetiapine] Other (See Comments)    Severe drowsiness Pt currently taking 12.5 mg BID, she is ok with this dose   Shellfish Allergy Anaphylaxis   Phenergan [Promethazine Hcl] Other (See Comments)    Chest pain     Prilosec [Omeprazole] Nausea And Vomiting    Pt can take protonix with no problems    Sulfa Antibiotics Other (See Comments)    Chest pain    Tegretol [Carbamazepine] Nausea And Vomiting   Prozac [Fluoxetine] Other (See Comments)    Increased Depression Suicidal thoughts   Tape Other (See Comments)    Skin tears, can only tolerate paper tape.   Tylenol [Acetaminophen] Rash and Other (See Comments)    Rash on face     Family History  Problem Relation Age of Onset   Mental illness Father    Asthma Father    PDD Brother    Seizures Brother      Prior to Admission medications   Medication  Sig Start Date End Date Taking? Authorizing Provider  albuterol (VENTOLIN HFA) 108 (90 Base) MCG/ACT inhaler Inhale 2 puffs into the lungs every 6 (six) hours as needed for wheezing or shortness of breath.    [provider]  BREYNA 160-4.5 MCG/ACT inhaler Inhale 1 puff into the lungs in the morning and at bedtime. 04/18/22   [provider]  CORLANOR 5 MG TABS tablet Take 5 mg by mouth 2 (two) times daily. 05/14/22   [provider]  Cyanocobalamin (VITAMIN B-12 PO) Take 1 capsule by mouth in the morning.    [provider]  cyclobenzaprine (FLEXERIL) 10 MG tablet Take 10 mg by mouth at bedtime. 05/10/22   [provider]  diclofenac Sodium (VOLTAREN) 1 % GEL Apply  1 Application topically 4 (four) times daily.    [provider]  escitalopram (LEXAPRO) 10 MG tablet Take 1 tablet (10 mg total) by mouth daily for 14 days. 02/07/22 05/26/22  Massengill, Harrold Donath, MD  hydrOXYzine (ATARAX) 25 MG tablet Take 25 mg twice daily by mouth as needed for anxiety. Take 100 mg by mouth for sleep as needed at bedtime. Patient taking differently: Take 25 mg by mouth daily. 04/30/22   Massengill, Harrold Donath, MD  lidocaine (HM LIDOCAINE PATCH) 4 % Place 1 patch onto the skin daily. Apply to the affected area 06/02/22   Carroll Sage, PA-C  Melatonin 3 MG CAPS Take 3 mg by mouth at bedtime.    [provider]  metoprolol succinate (TOPROL-XL) 25 MG 24 hr tablet Take 1 tablet (25 mg total) by mouth daily. Patient not taking: Reported on 05/19/2022 05/17/22   Rankin, Shuvon B, NP  OLANZapine (ZYPREXA) 5 MG tablet Take 1 tablet (5 mg total) by mouth daily as needed for up to 7 days (anxiety). Patient taking differently: Take 5 mg by mouth at bedtime. 04/30/22 05/26/22  Massengill, Harrold Donath, MD  pantoprazole (PROTONIX) 40 MG tablet Take 1 tablet (40 mg total) by mouth daily for 14 days. 02/07/22 05/26/22  Massengill, Harrold Donath, MD  prazosin (MINIPRESS) 1 MG capsule Take 1 capsule (1 mg total) by mouth at bedtime for 14 days. 02/06/22 05/26/22  Massengill, Harrold Donath, MD  pregabalin (LYRICA) 100 MG capsule Take 100 mg by mouth 3 (three) times daily.    [provider]  traZODone (DESYREL) 50 MG tablet Take 1 tablet (50 mg total) by mouth at bedtime as needed for up to 7 days for sleep. Patient taking differently: Take 50 mg by mouth at bedtime. 04/30/22 05/26/22  Phineas Inches, MD    Physical Exam: BP 107/70   Pulse 63   Temp 98.5 F (36.9 C) (Oral)   Resp 16   Ht 5\' 4"  (1.626 m)   Wt 95.3 kg   LMP 04/25/2022 (Approximate) Comment: per pt, not pregnant  SpO2 95%   BMI 36.06 kg/m   General:  Alert, oriented, calm, in no acute distress, obese female appearing her  stated age resting on a stretcher in the ER, with suicide precautions sitter at the bedside Eyes: EOMI, clear conjuctivae, white sclerea Neck: supple, no masses, trachea mildline  Cardiovascular: RRR, no murmurs or rubs, no peripheral edema  Respiratory: clear to auscultation bilaterally, no wheezes, no crackles  Abdomen: soft, diffusely tender, negative Murphy sign, nondistended, normal bowel tones heard  Skin: dry, no rashes  Musculoskeletal: no joint effusions, normal range of motion  Psychiatric: appropriate affect, normal speech  Neurologic: extraocular muscles intact, clear speech, moving all extremities with intact sensorium  Labs on Admission:  Basic Metabolic Panel: Recent Labs  Lab 06/04/22 2332 06/05/22 0331  NA 138 142  K 3.3* 3.5  CL 108 106  CO2 23  --   GLUCOSE 119* 107*  BUN 10 10  CREATININE 0.79 0.70  CALCIUM 8.7*  --   MG 2.1  --    Liver Function Tests: Recent Labs  Lab 06/04/22 2332  AST 19  ALT 15  ALKPHOS 68  BILITOT 0.5  PROT 7.2  ALBUMIN 3.7   No results for input(s): "LIPASE", "AMYLASE" in the last 168 hours. No results for input(s): "AMMONIA" in the last 168 hours. CBC: Recent Labs  Lab 06/04/22 2332 06/05/22 0331  WBC 6.6  --   NEUTROABS 3.8  --   HGB 10.9* 10.9*  HCT 34.6* 32.0*  MCV 80.1  --   PLT 254  --    Cardiac Enzymes: No results for input(s): "CKTOTAL", "CKMB", "CKMBINDEX", "TROPONINI" in the last 168 hours.  BNP (last 3 results) No results for input(s): "BNP" in the last 8760 hours.  ProBNP (last 3 results) No results for input(s): "PROBNP" in the last 8760 hours.  CBG: Recent Labs  Lab 06/04/22 2339  GLUCAP 109*    Radiological Exams on Admission: No results found.  Assessment/Plan This is a 32 year old female with a complex psychiatric history including PTSD, schizoaffective disorder, severe bipolar 1 disorder, generalized anxiety disorder who unfortunately has had multiple inpatient psychiatric  stays due to multiple intentional drug overdoses. -Observation admission on telemetry -IV glucagon as needed bradycardia -Anticipate Ward clearance and psychiatric evaluation in the next 24 hours Active Problems:   Schizoaffective disorder, bipolar type (HCC)   GAD (generalized anxiety disorder)   Suicide attempt (HCC)   Normocytic anemia-unclear etiology, check anemia panel  DVT prophylaxis: Lovenox     Code Status: Full Code  Consults called: None  Admission status: Observation on telemetry  Time spent: 35 minutes  Tilford Deaton Sharlette Dense MD [facility] Pager [phone]  If 7PM-7AM, please contact night-coverage [email or web address] Password [facility]  06/05/2022, 7:40 AM

## 2022-06-06 DIAGNOSIS — D649 Anemia, unspecified: Secondary | ICD-10-CM | POA: Diagnosis not present

## 2022-06-06 DIAGNOSIS — T50902A Poisoning by unspecified drugs, medicaments and biological substances, intentional self-harm, initial encounter: Secondary | ICD-10-CM | POA: Diagnosis not present

## 2022-06-06 MED ORDER — CARIPRAZINE HCL 3 MG PO CAPS: 4.5000 mg | ORAL_CAPSULE | Freq: Every day | ORAL | Status: DC

## 2022-06-06 MED ORDER — HYDROXYZINE HCL 10 MG PO TABS
10.0000 mg | ORAL_TABLET | Freq: Once | ORAL | Status: DC | PRN
  Filled 2022-06-06: qty 1

## 2022-06-06 MED ORDER — PREGABALIN 50 MG PO CAPS
100.0000 mg | ORAL_CAPSULE | Freq: Three times a day (TID) | ORAL | Status: DC
  Administered 2022-06-06 – 2022-06-07 (×5): 100 mg via ORAL
  Filled 2022-06-06 (×5): qty 2

## 2022-06-06 MED ORDER — POTASSIUM CHLORIDE CRYS ER 20 MEQ PO TBCR
40.0000 meq | EXTENDED_RELEASE_TABLET | Freq: Two times a day (BID) | ORAL | Status: AC
  Administered 2022-06-06 – 2022-06-07 (×2): 40 meq via ORAL
  Filled 2022-06-06 (×2): qty 2

## 2022-06-06 NOTE — Consult Note (Signed)
[facility] Face-to-Face Psychiatry Consult   Reason for Consult:  Suicidal Ideations  Referring Physician:  EPD Patient Identification: [name] MRN:  [id number] Principal Diagnosis: Intentional drug overdose [facility]) Diagnosis:  Principal Problem:   Intentional drug overdose (HCC) Active Problems:   Schizoaffective disorder, bipolar type (HCC)   GAD (generalized anxiety disorder)   Suicide attempt (HCC)   Normocytic anemia   Total Time spent with patient: 1 hour  Subjective:   [name] is a 32 y.o. female presents with chronic suicidal ideation. Patient is diagnosed with Schizoaffective, borderline personality disorder and major depressive disorder who presents for suicide attempt by overdose. [name] is well know to the Psychiatric Consult service for her frequent admissions, ER visits and chronic suicidal ideations. She states her boyfriend triggered her which lead to this suicide attempt. She has a history of blaming others for her actions. She is currently minimizing her most recent suicide attempt noting that she has multiple plans in place and a 82 year old daughter that depends on her. Despite previous safety plans, mitigation of suicide risks, and treatment adherence; she continues to remain a danger to herself at this time. She endorses struggling with the concept of inpatient psychiatric hospitalization and would like to work on safety proof plan.   Currently she is denying suicidal or homicidal ideations.  Denies auditory or visual hallucinations. She has historically received services under [facility] CST team. She reports compliance with her psychotropic medication that includes   On assessment 06/06/2022, the patient exhibits cluster B personality traits and requests discharge home vs to the behavioral health hospital.  She states that she does regret her suicide attempt and states " I really dont mean it. "   She denies experiencing any plan or  intention of harming herself in the current environment.  Feel the patient's primary diagnosis is borderline personality disorder and that the patient needs intensive outpatient therapy such as DBT.  Given the severity of her overdose and her underlying mental illness, recommend inpatient psychiatric placement once medically ready for discharge. Patient although cooperative this morning lacks \\insight  into discrepancy between verbal reassurances that she will not hurt herself and demonstrated behaviors (numerous impulsive suicide attempts; not providing team with direct contact information for CST; refusal to engage in establishing safe follow up plan; recent admission suicide attempt last week). Patient represents acutely elevated risk of harm to self and warrants inpatient psychiatric care at this time; will continue to assess bed availability. HPI: [name] is a 32 y.o. female with medical history significant for generalized anxiety, PTSD, schizoaffective disorder, severe bipolar 1 disorder, and multiple suicide attempts who presented to the ER for the 16th time this year with complaints of intentional drug overdose.  Reports that she took 7 trazodone, 7 Lexapro, 9 Vraylar, 7 Protonix, 7 Flexeril, 25 Vistaril, 7 Zyprexa, 7 prazosin, 14 metoprolol XL and her main complaint is that she feels sleepy.  On review of systems, she tells me that she has a little bit of right-sided abdominal pain, and associated nausea.  Has no complaints otherwise.  She is being monitored in the ER, was given a dose of glucagon when her heart rate dropped to 40.   Past Psychiatric History: past psychiatric history significant for autism, anxiety, depression, and schizoaffective disorder (bipolar type), and borderline personality.  Last hospitalization 04/2021 at [facility] Multiple admissions and ER visits.  Suicide attempts "tto many to count about 20 x, they been so simple. "  Risk to Self:  Denies currently,  although she is here for suicide attempt Risk to Others:  Denies Prior Inpatient Therapy:  multiple hospitalizations. [facility] 04/2022; Dec 2023 Prior Outpatient Therapy:  CST team [facility].   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 fragile X syndrome    Chronic constipation    Depression    Drug-seeking behavior    Essential tremor    Headache    Overdose of acetaminophen 07/2017   and other meds   Personality disorder (HCC)    Schizo-affective psychosis (HCC)    Schizoaffective disorder, bipolar type (HCC)    Seizures (HCC)    Last seizure December 2017   Sleep apnea     Past Surgical History:  Procedure Laterality Date   MOUTH SURGERY  2009 or 2010   Family History:  Family History  Problem Relation Age of Onset   Mental illness Father    Asthma Father    PDD Brother    Seizures Brother    Family Psychiatric  History:  Social History:  Social History   Substance and Sexual Activity  Alcohol Use No   Alcohol/week: 1.0 standard drink of alcohol   Types: 1 Standard drinks or equivalent per week   Comment: denies at this time     Social History   Substance and Sexual Activity  Drug Use No   Comment: History of cocaine use at age 27 for 4 months    Social History   Socioeconomic History   Marital status: Widowed    Spouse name: Not on file   Number of children: 0   Years of education: Not on file   Highest education level: Not on file  Occupational History   Occupation: disability  Tobacco Use   Smoking status: Former    Packs/day: 0    Types: Cigarettes   Smokeless tobacco: Never   Tobacco comments:    Smoked for 2  years age [age]  Vaping Use   Vaping Use: Never used  Substance and Sexual Activity   Alcohol use: No    Alcohol/week: 1.0 standard drink of alcohol    Types: 1 Standard drinks or equivalent per week     Comment: denies at this time   Drug use: No    Comment: History of cocaine use at age 36 for 4 months   Sexual activity: Not Currently    Birth control/protection: None  Other Topics Concern   Not on file  Social History Narrative   Marital status: Widowed      Children: daughter      Lives: with boyfriend, in two story home      Employment:  Disability      Tobacco: quit smoking; smoked for two years.      Alcohol ;none      Drugs: none   Has not traveled outside of the country.   Right handed         Social Determinants of Health   Financial Resource Strain: Not on file  Food Insecurity: No Food Insecurity (05/06/2022)   Hunger Vital Sign    Worried About Running Out of Food in the Last Year: Never true    Ran Out of Food in the Last Year: Never true  Transportation Needs: Unmet Transportation Needs (05/06/2022)  PRAPARE - Administrator, Civil Service (Medical): Yes    Lack of Transportation (Non-Medical): Yes  Physical Activity: Not on file  Stress: Not on file  Social Connections: Not on file   Additional Social History:    Allergies:   Allergies  Allergen Reactions   Bee Venom Anaphylaxis   Coconut Flavor Anaphylaxis and Rash   Fish Allergy Anaphylaxis   Geodon [Ziprasidone Hcl] Other (See Comments)    Pt states that this medication causes paralysis of the mouth.     Haloperidol And Related Other (See Comments)    Pt states that this medication causes paralysis of the mouth, jaw locks up   Lithobid [Lithium] Other (See Comments)    Seizure-like activity    Roxicodone [Oxycodone] Other (See Comments)    Hallucinations    Seroquel [Quetiapine] Other (See Comments)    Severe drowsiness Pt currently taking 12.5 mg BID, she is ok with this dose   Shellfish Allergy Anaphylaxis   Phenergan [Promethazine Hcl] Other (See Comments)    Chest pain     Prilosec [Omeprazole] Nausea And Vomiting    Pt can take protonix with no problems    Sulfa  Antibiotics Other (See Comments)    Chest pain    Tegretol [Carbamazepine] Nausea And Vomiting   Prozac [Fluoxetine] Other (See Comments)    Increased Depression Suicidal thoughts   Tape Other (See Comments)    Skin tears, can only tolerate paper tape.   Tylenol [Acetaminophen] Rash and Other (See Comments)    Rash on face     Labs:  Results for orders placed or performed during the hospital encounter of 06/04/22 (from the past 48 hour(s))  Comprehensive metabolic panel     Status: Abnormal   Collection Time: 06/04/22 11:32 PM  Result Value Ref Range   Sodium 138 135 - 145 mmol/L   Potassium 3.3 (L) 3.5 - 5.1 mmol/L   Chloride 108 98 - 111 mmol/L   CO2 23 22 - 32 mmol/L   Glucose, Bld 119 (H) 70 - 99 mg/dL    Comment: Glucose reference range applies only to samples taken after fasting for at least 8 hours.   BUN 10 6 - 20 mg/dL   Creatinine, Ser 4.09 0.44 - 1.00 mg/dL   Calcium 8.7 (L) 8.9 - 10.3 mg/dL   Total Protein 7.2 6.5 - 8.1 g/dL   Albumin 3.7 3.5 - 5.0 g/dL   AST 19 15 - 41 U/L   ALT 15 0 - 44 U/L   Alkaline Phosphatase 68 38 - 126 U/L   Total Bilirubin 0.5 0.3 - 1.2 mg/dL   GFR, Estimated >81 >19 mL/min    Comment: (NOTE) Calculated using the CKD-EPI Creatinine Equation (2021)    Anion gap 7 5 - 15    Comment: Performed at [facility], [address] [place], [place] [address]  Salicylate level     Status: Abnormal   Collection Time: 06/04/22 11:32 PM  Result Value Ref Range   Salicylate Lvl <7.0 (L) 7.0 - 30.0 mg/dL    Comment: Performed at [facility], [address], [place], [place] [address]  Acetaminophen level     Status: Abnormal   Collection Time: 06/04/22 11:32 PM  Result Value Ref Range   Acetaminophen (Tylenol), Serum <10 (L) 10 - 30 ug/mL    Comment: (NOTE) Therapeutic concentrations vary significantly. A range of 10-30 ug/mL  may be an effective concentration for many patients.  However, some  are best  treated at concentrations outside of this range. Acetaminophen concentrations >150 ug/mL at 4 hours after ingestion  and >50 ug/mL at 12 hours after ingestion are often associated with  toxic reactions.  Performed at [facility], [address], [place], [place] [address]   Ethanol     Status: None   Collection Time: 06/04/22 11:32 PM  Result Value Ref Range   Alcohol, Ethyl (B) <10 <10 mg/dL    Comment: (NOTE) Lowest detectable limit for serum alcohol is 10 mg/dL.  For medical purposes only. Performed at [facility], [address], [place], [place] [address]   CBC WITH DIFFERENTIAL     Status: Abnormal   Collection Time: 06/04/22 11:32 PM  Result Value Ref Range   WBC 6.6 4.0 - 10.5 K/uL   RBC 4.32 3.87 - 5.11 MIL/uL   Hemoglobin 10.9 (L) 12.0 - 15.0 g/dL   HCT 09.8 (L) 11.9 - 14.7 %   MCV 80.1 80.0 - 100.0 fL   MCH 25.2 (L) 26.0 - 34.0 pg   MCHC 31.5 30.0 - 36.0 g/dL   RDW 82.9 56.2 - 13.0 %   Platelets 254 150 - 400 K/uL   nRBC 0.0 0.0 - 0.2 %   Neutrophils Relative % 58 %   Neutro Abs 3.8 1.7 - 7.7 K/uL   Lymphocytes Relative 36 %   Lymphs Abs 2.4 0.7 - 4.0 K/uL   Monocytes Relative 5 %   Monocytes Absolute 0.3 0.1 - 1.0 K/uL   Eosinophils Relative 1 %   Eosinophils Absolute 0.0 0.0 - 0.5 K/uL   Basophils Relative 0 %   Basophils Absolute 0.0 0.0 - 0.1 K/uL   Immature Granulocytes 0 %   Abs Immature Granulocytes 0.02 0.00 - 0.07 K/uL    Comment: Performed at [facility], [address] [place], [place] [address]  Magnesium     Status: None   Collection Time: 06/04/22 11:32 PM  Result Value Ref Range   Magnesium 2.1 1.7 - 2.4 mg/dL    Comment: Performed at [facility], [address], [place], [place] [address]  CBG monitoring, ED     Status: Abnormal   Collection Time: 06/04/22 11:39 PM  Result Value Ref Range   Glucose-Capillary 109 (H) 70 - 99 mg/dL    Comment: Glucose  reference range applies only to samples taken after fasting for at least 8 hours.  Acetaminophen level     Status: Abnormal   Collection Time: 06/05/22  1:10 AM  Result Value Ref Range   Acetaminophen (Tylenol), Serum <10 (L) 10 - 30 ug/mL    Comment: (NOTE) Therapeutic concentrations vary significantly. A range of 10-30 ug/mL  may be an effective concentration for many patients. However, some  are best treated at concentrations outside of this range. Acetaminophen concentrations >150 ug/mL at 4 hours after ingestion  and >50 ug/mL at 12 hours after ingestion are often associated with  toxic reactions.  Performed at [facility], [address], [place], [place] [address]   Urine rapid drug screen (hosp performed)     Status: None   Collection Time: 06/05/22  3:26 AM  Result Value Ref Range   Opiates NONE DETECTED NONE DETECTED   Cocaine NONE DETECTED NONE DETECTED   Benzodiazepines NONE DETECTED NONE DETECTED   Amphetamines NONE DETECTED NONE DETECTED   Tetrahydrocannabinol NONE DETECTED NONE DETECTED   Barbiturates NONE DETECTED NONE DETECTED  Comment: (NOTE) DRUG SCREEN FOR MEDICAL PURPOSES ONLY.  IF CONFIRMATION IS NEEDED FOR ANY PURPOSE, NOTIFY LAB WITHIN 5 DAYS.  LOWEST DETECTABLE LIMITS FOR URINE DRUG SCREEN Drug Class                     Cutoff (ng/mL) Amphetamine and metabolites    1000 Barbiturate and metabolites    200 Benzodiazepine                 200 Opiates and metabolites        300 Cocaine and metabolites        300 THC                            50 Performed at [facility], [address] [place], [place] [address]   I-Stat beta hCG blood, ED     Status: None   Collection Time: 06/05/22  3:31 AM  Result Value Ref Range   I-stat hCG, quantitative <5.0 <5 mIU/mL   Comment 3            Comment:   GEST. AGE      CONC.  (mIU/mL)   <=1 WEEK        5 - 50     2 WEEKS       50 - 500     3 WEEKS       100 - 10,000      4 WEEKS     1,000 - 30,000        FEMALE AND NON-PREGNANT FEMALE:     LESS THAN 5 mIU/mL   I-stat chem 8, ED (not at [facility], [facility] or [facility])     Status: Abnormal   Collection Time: 06/05/22  3:31 AM  Result Value Ref Range   Sodium 142 135 - 145 mmol/L   Potassium 3.5 3.5 - 5.1 mmol/L   Chloride 106 98 - 111 mmol/L   BUN 10 6 - 20 mg/dL   Creatinine, Ser 1.91 0.44 - 1.00 mg/dL   Glucose, Bld 478 (H) 70 - 99 mg/dL    Comment: Glucose reference range applies only to samples taken after fasting for at least 8 hours.   Calcium, Ion 1.24 1.15 - 1.40 mmol/L   TCO2 26 22 - 32 mmol/L   Hemoglobin 10.9 (L) 12.0 - 15.0 g/dL   HCT 29.5 (L) 62.1 - 30.8 %  CBC     Status: Abnormal   Collection Time: 06/05/22  9:41 AM  Result Value Ref Range   WBC 7.1 4.0 - 10.5 K/uL   RBC 4.11 3.87 - 5.11 MIL/uL   Hemoglobin 10.5 (L) 12.0 - 15.0 g/dL   HCT 65.7 (L) 84.6 - 96.2 %   MCV 82.2 80.0 - 100.0 fL   MCH 25.5 (L) 26.0 - 34.0 pg   MCHC 31.1 30.0 - 36.0 g/dL   RDW 95.2 84.1 - 32.4 %   Platelets 259 150 - 400 K/uL   nRBC 0.0 0.0 - 0.2 %    Comment: Performed at [facility], [address], [place], [place] [address]  Creatinine, serum     Status: None   Collection Time: 06/05/22  9:41 AM  Result Value Ref Range   Creatinine, Ser 0.69 0.44 - 1.00 mg/dL   GFR, Estimated >72 >53 mL/min    Comment: (NOTE) Calculated using the CKD-EPI Creatinine Equation (2021) Performed at [facility], [address], [place], [place] [address]   Vitamin B12     Status: Abnormal   Collection Time: 06/05/22  9:41 AM  Result Value Ref Range   Vitamin B-12 3,066 (H) 180 - 914 pg/mL    Comment: RESULT CONFIRMED BY MANUAL DILUTION (NOTE) This assay is not validated for testing neonatal or myeloproliferative syndrome specimens for Vitamin B12 levels. Performed at [facility], [address] [place], [place] [address]   Folate     Status: None   Collection  Time: 06/05/22  9:41 AM  Result Value Ref Range   Folate 13.2 >5.9 ng/mL    Comment: Performed at [facility], [address], [place], [place] [address]  Iron and TIBC     Status: Abnormal   Collection Time: 06/05/22  9:41 AM  Result Value Ref Range   Iron 35 28 - 170 ug/dL   TIBC 914 782 - 956 ug/dL   Saturation Ratios 9 (L) 10.4 - 31.8 %   UIBC 361 ug/dL    Comment: Performed at [facility], [address], [place], [place] [address]  Ferritin     Status: None   Collection Time: 06/05/22  9:41 AM  Result Value Ref Range   Ferritin 12 11 - 307 ng/mL    Comment: Performed at [facility], [address], [place], [place] [address]  Reticulocytes     Status: None   Collection Time: 06/05/22  9:41 AM  Result Value Ref Range   Retic Ct Pct 1.6 0.4 - 3.1 %   RBC. 4.14 3.87 - 5.11 MIL/uL   Retic Count, Absolute 65.0 19.0 - 186.0 K/uL   Immature Retic Fract 13.6 2.3 - 15.9 %    Comment: Performed at [facility], [address], [place], [place] [address]    Current Facility-Administered Medications  Medication Dose Route Frequency Provider Last Rate Last Admin   albuterol (PROVENTIL) (2.5 MG/3ML) 0.083% nebulizer solution 2.5 mg  2.5 mg Nebulization Q2H PRN Kirby Crigler, Mir M, MD       cyclobenzaprine (FLEXERIL) tablet 10 mg  10 mg Oral QHS Kirby Crigler, Mir M, MD   10 mg at 06/05/22 2109   enoxaparin (LOVENOX) injection 40 mg  40 mg Subcutaneous Q24H Kirby Crigler, Mir M, MD       hydrOXYzine (ATARAX) tablet 10 mg  10 mg Oral Once PRN Luiz Iron, NP       ibuprofen (ADVIL) tablet 400 mg  400 mg Oral Q6H PRN Kirby Crigler, Mir M, MD   400 mg at 06/05/22 2032   lidocaine (LIDODERM) 5 % 1 patch  1 patch Transdermal Q24H Kirby Crigler, Mir M, MD       mometasone-formoterol [facility]) 200-5 MCG/ACT inhaler 2 puff  2 puff Inhalation BID Kirby Crigler, Mir M, MD   2 puff at 06/06/22 0826   ondansetron (ZOFRAN) tablet 4 mg  4  mg Oral Q6H PRN Kirby Crigler, Mir M, MD       Or   ondansetron [facility]) injection 4 mg  4 mg Intravenous Q6H PRN Kirby Crigler, Mir M, MD   4 mg at 06/05/22 2032   pantoprazole (PROTONIX) EC tablet 40 mg  40 mg Oral Daily Kirby Crigler, Mir M, MD   40 mg at 06/06/22 1032   traZODone (DESYREL) tablet 25 mg  25 mg Oral QHS PRN Maryln Gottron, MD   25 mg at 06/05/22 2109    Musculoskeletal: Strength & Muscle Tone: within  normal limits Gait & Station: normal Patient leans: N/A Psychiatric Specialty Exam: Physical Exam Vitals and nursing note reviewed. Exam conducted with a chaperone present.  Constitutional:      Appearance: Normal appearance. She is obese.  Neurological:     General: No focal deficit present.     Mental Status: She is alert and oriented to person, place, and time. Mental status is at baseline.  Psychiatric:        Mood and Affect: Mood normal.        Behavior: Behavior normal.        Thought Content: Thought content normal.        Judgment: Judgment normal.     Review of Systems  Psychiatric/Behavioral:  Positive for suicidal ideas. The patient is nervous/anxious.     Blood pressure 106/75, pulse 70, temperature 98.2 F (36.8 C), temperature source Oral, resp. rate 16, height 5\' 4"  (1.626 m), weight 95.3 kg, last menstrual period 04/25/2022, SpO2 99 %.Body mass index is 36.06 kg/m.  General Appearance: Fairly Groomed  Eye Contact:  Fair  Speech:  Normal Rate  Volume:  Normal  Mood:  Anxious  Affect:  Blunt and Congruent  Thought Process:  Coherent, Goal Directed, and Descriptions of Associations: Intact  Orientation:  Full (Time, Place, and Person)  Thought Content:  WDL  Suicidal Thoughts:  [place] is here for suicide attempt  Homicidal Thoughts:  No  Memory:  Immediate;   Good Recent;   Good Remote;   Good  Judgement:  Poor  Insight:  Lacking  Psychomotor Activity:  Normal  Concentration:  Concentration: Good and Attention Span: Good  Recall:  Good   Fund of Knowledge:  Good  Language:  Good  Akathisia:  No  Handed:  Right  AIMS (if indicated):     Assets:  Communication Skills Desire for Improvement Financial Resources/Insurance Leisure Time Physical Health Social Support  ADL's:  Intact  Cognition:  WNL  Sleep:        The patient is at acutely elevated risk of suicide/dangerousness to others and further worsening of psychiatric condition. Risk factors for suicide for this patient include: current diagnosis of depression, agitation, suicide attempt leading to current admission, previous acts of self-harm, past violence and chronic mood lability . Risk factors for violence for this patient include:  younger age, aggression, agitation, history of aggressive behavior and chronic impulsivity. Protective factors for this patient are limited to: motivation for treatment, supportive family, presence of an available support system, safe housing and support system in agreement with treatment recommendations. The patient does meet [facility] involuntary commitment criteria at this time. This was explained to the patient who voiced understanding.   Plan:  Recommend inpatient treatment when medically cleared.   Labs WNL; iron panel normal; B12 elevated 3066; and CBC chronic anemia. UDS negative. QTc prolongation-  QTc 480 on 04/30; do not resume psychotropic medications at this time. Continue current recommendations to replace mag and phos. Will look to resume medications tomorrow.  -Continue suicide sitter.   -Patient meets criteria for IVC if she attempts to leave.   Disposition: Recommend psychiatric Inpatient admission when medically cleared.  Maryagnes Amos, FNP 06/06/2022 3:03 PM

## 2022-06-06 NOTE — Progress Notes (Addendum)
Chaplain engaged in an initial visit with [name]. [name] immediately began to share that she believes having a lockable pill box that will only release a certain amount of medicine throughout the day and caregiver support will be most helpful for her going forward. She was adamant that she did not want to go to a group home or psychiatric hospital. Chaplain worked to help [name] explore her needs for additional support and assistance. [name] stated that she has been able to take care of herself through working and maintaining her home. She believes that she has all the tools to ensure that she does not try to commit suicide again.   Chaplain and [name] talked about how many times she had already tried to commit suicide this year. Chaplain illuminated for her the severity of suicide and how she can be high functioning and still need mental health support. [name] maintained her use of the automatic pillbox as an intervention that should be tried first.   [name] and [name] were also able to talk about her coping skills. She noted that she did call on her community therapy advocate for help before this last attempt and has utilized crisis hotlines in the past. She also values "dime piece painting," which has been a helpful and fun tool for her. [name] recognized listening to music as another tool.   Chaplain asked [name] about any current stressors in her life and she identified her ex-boyfriend living with her as one. She stated that he does not have any respect or value for her. This also allowed space for [name] to mention the passing of her husband from addiction and having a daughter. She noted that she gets to see her daughter every two weeks. ]  Chaplain provided a supportive and compassionate presence, and provided space for [name] to process and reflect. Chaplain offered prayer to [name] who voiced that she is a [name], but she declined at this time. Chaplain provided understanding and gratefulness in getting  to know her.     06/06/22 1200  Spiritual Encounters  Type of Visit Initial  Care provided to: Patient  Spiritual Framework  Presenting Themes Coping tools;Community and relationships;Impactful experiences and emotions  Community/Connection None  Interventions  Spiritual Care Interventions Made Established relationship of care and support;Compassionate presence;Reflective listening;Narrative/life review  Intervention Outcomes  Outcomes Connection to spiritual care;Awareness of support

## 2022-06-06 NOTE — Progress Notes (Signed)
[facility] PROGRESS NOTE    Progress Note  [name]  ZOX:[id number] DOB: 1990-02-14 DOA: 06/04/2022 PCP: [name]     Brief Narrative:   [name] is an 32 y.o. female past medical history of general anxiety PTSD, severe bipolar disorder multiple suicide attempt presents to the ED for 60 times this year for drug overdose reported she took 7 trazodone 7 Lexapro Protonix Flexeril Vistaril Zyprexa paroxetine and metoprolol   Assessment/Plan:   Intentional drug overdose (HCC)/suicidal attempt Started on IV glucagon as needed for bradycardia.  Has now resolved. Psych has been consulted for intentional suicidal attempt. Awaiting recommendations.  Schizoaffective disorder, bipolar type (HCC)/ GAD (generalized anxiety disorder) Will await psych recommendations to restart medications.    DVT prophylaxis: Lovnoex Family Communication:none Status is: Inpatient Remains inpatient appropriate because: Intentional suicidal attempt    Code Status:     Code Status Orders  (From admission, onward)           Start     Ordered   06/05/22 0735  Full code  Continuous       Question:  By:  Answer:  Consent: discussion documented in EHR   06/05/22 0735           Code Status History     Date Active Date Inactive Code Status Order ID Comments User Context   06/05/2022 0211 06/05/2022 0735 Full Code [id number]  [facility]   05/15/2022 2334 05/16/2022 1656 Full Code [id number]  Sindy Guadeloupe, NP ED   05/06/2022 1207 05/07/2022 1252 Full Code [id number]  Bing Neighbors, NP ED   05/06/2022 0328 05/06/2022 1202 Full Code [id number]  Garlon Hatchet, PA-C ED   05/04/2022 1502 05/05/2022 1312 Full Code [id number]  Layla Barter, NP ED   05/02/2022 2251 05/03/2022 2124 Full Code [id number]  Terrilee Files, MD ED   04/19/2022 1542 04/30/2022 1714 Full Code [id number]  Louie Boston, PMHNP Inpatient   04/19/2022 0000  04/19/2022 1542 Full Code [id number]  Paula Libra, MD ED   04/11/2022 2208 04/12/2022 0815 Full Code [id number]  Sindy Guadeloupe, NP ED   03/10/2022 2219 03/11/2022 1525 Full Code [id number]  Bobbitt, Franchot Mimes, NP ED   02/21/2022 2304 02/22/2022 1533 Full Code [id number]  Sindy Guadeloupe, NP ED   02/10/2022 0205 02/11/2022 1536 Full Code [id number]  Jasper Riling, NP ED   01/31/2022 1337 02/06/2022 1636 Full Code [id number]  Rankin, Shuvon B, NP Inpatient   01/30/2022 2204 01/31/2022 1235 Full Code [id number]  Mancel Bale, NP ED   01/01/2022 1532 01/10/2022 2217 Full Code [id number]  Carlyn Reichert, MD Inpatient   12/28/2021 0135 01/01/2022 1502 Full Code [id number]  Lorin Glass, MD ED   12/27/2021 2300 12/28/2021 0134 Full Code [id number]  Cecil Cobbs, PA-C ED   12/10/2021 2055 12/11/2021 1609 Full Code [id number]  Maricela Bo, NP ED   11/01/2021 2116 11/03/2021 1436 Full Code [id number]  Sindy Guadeloupe, NP ED   10/29/2021 0927 10/30/2021 1645 Full Code [id number]  Lonell Grandchild, MD ED   10/25/2021 2033 10/26/2021 1753 Full Code [id number]  Sindy Guadeloupe, NP ED   10/18/2021 2211 10/23/2021 1704 Full Code [id number]  Lauree Chandler, NP Inpatient   10/18/2021 1457 10/18/2021 2210 Full Code [id number]  Lauree Chandler, NP ED   10/06/2021 1806 10/10/2021 1656 Full Code [id number]  Park Pope, MD Inpatient   10/05/2021 0613 10/06/2021 1338 Full Code [id number]  Zadie Rhine,  MD ED   09/02/2021 2314 09/04/2021 1512 Full Code [id number]  Sindy Guadeloupe, NP ED   08/02/2021 2232 08/03/2021 1643 Full Code [id number]  Jackelyn Poling, NP ED   07/30/2021 0747 07/31/2021 1636 Full Code [id number]  Layla Barter, NP ED   07/29/2021 2349 07/30/2021 0630 Full Code [id number]  Claudie Leach, PA-C ED   07/25/2021 0427 07/28/2021 1644 Full Code [id number]  Herbie Saxon Inpatient   07/25/2021 0427 07/25/2021 0427 Full Code [id number]  Herbie Saxon Inpatient   07/20/2021 2316 07/25/2021 0410 Full Code [id number]   Synetta Fail, MD ED   07/19/2021 2331 07/20/2021 0648 Full Code [id number]  Sindy Guadeloupe, NP ED   07/18/2021 1102 07/19/2021 1922 Full Code [id number]  Elson Areas, PA-C ED   07/15/2021 2131 07/17/2021 1445 Full Code [id number]  Jasper Riling, NP ED   07/12/2021 2034 07/14/2021 1331 Full Code [id number]  Jackelyn Poling, NP ED   07/04/2021 2343 07/11/2021 1747 Full Code [id number]  Maryagnes Amos, FNP Inpatient   07/01/2021 1545 07/04/2021 2250 Full Code [id number]  Lorin Glass, MD ED   06/26/2021 1340 06/27/2021 2029 Full Code [id number]  Peter Garter, PA ED   06/23/2021 2350 06/25/2021 0424 Full Code [id number]  Maricela Bo, NP ED   06/04/2021 0438 06/04/2021 1617 Full Code [id number]  Palumbo, April, MD ED   05/01/2021 0136 05/01/2021 1733 Full Code [id number]  Maricela Bo, NP ED   04/21/2021 1814 04/26/2021 1642 Full Code [id number]  Lauree Chandler, NP Inpatient   04/21/2021 0013 04/21/2021 1720 Full Code [id number]  Sindy Guadeloupe, NP ED   03/10/2021 2336 03/11/2021 1914 Full Code [id number]  Jackelyn Poling, NP ED   03/02/2021 1825 03/04/2021 1520 Full Code [id number]  Lenard Lance, FNP ED   12/01/2020 2229 12/03/2020 1331 Full Code [id number]  Jackelyn Poling, NP ED   11/21/2020 0330 11/22/2020 1731 Full Code [id number]  Roxy Horseman, PA-C ED   02/04/2020 1413 02/08/2020 1603 Full Code [id number]  Money, Gerlene Burdock, FNP Inpatient   02/03/2020 0242 02/04/2020 1406 Full Code [id number]  Jackelyn Poling, NP ED   01/30/2020 0452 01/30/2020 2004 Full Code [id number]  Devoria Albe, MD ED   01/26/2020 0317 01/26/2020 1623 Full Code [id number]  Antony Madura, PA-C ED   01/23/2020 2311 01/24/2020 1546 Full Code [id number]  Gillermo Murdoch, NP ED   01/17/2020 1229 01/22/2020 1650 Full Code [id number]  Chales Abrahams, NP Inpatient   01/16/2020 0443 01/17/2020 1216 Full Code [id number]  Elpidio Anis, PA-C ED   01/14/2020 0038 01/14/2020 2104 Full Code [id number]  Jackelyn Poling, NP ED   08/02/2019 1934  08/03/2019 1750 Full Code [id number]  Raeford Razor, MD ED   07/27/2019 1437 07/27/2019 1442 Full Code [id number]  Money, Gerlene Burdock, FNP ED   07/26/2019 2304 07/27/2019 0138 Full Code [id number]  Jackelyn Poling, NP ED   07/09/2019 1153 07/13/2019 1917 Full Code [id number]  Antonieta Pert, MD Inpatient   07/08/2019 0306 07/08/2019 1709 Full Code [id number]  Palumbo, April, MD ED   07/01/2019 1800 07/06/2019 1902 Full Code [id number]  Maryagnes Amos, FNP Inpatient   07/01/2019 1800 07/01/2019 1800 Full Code [id number]  Maryagnes Amos, FNP Inpatient   06/30/2019 1919 07/01/2019 1630 Full Code [id number]  Gwyneth Sprout, MD ED   03/16/2019 0354 03/16/2019 1609 Full Code [id number]  Elpidio Anis, PA-C ED   03/05/2019 1324 03/07/2019 2335 Full Code [id number]  Maisie Fus,  Garlan Fillers, NP Inpatient   03/05/2019 0309 03/05/2019 1218 Full Code [id number]  Darylene Price ED   08/19/2017 0227 08/20/2017 1432 Full Code [id number]  Rise Mu, PA-C ED   08/17/2017 0026 08/17/2017 1958 Full Code [id number]  Roxy Horseman, PA-C ED   08/05/2017 2225 08/06/2017 1913 Full Code [id number]  Elpidio Anis, PA-C ED   07/24/2017 2245 07/31/2017 1518 Full Code [id number]  Shari Prows, MD Inpatient   07/22/2017 0520 07/24/2017 2227 Full Code [id number]  Eduard Clos, MD ED   06/30/2017 2010 07/04/2017 1754 Full Code [id number]  Rometta Emery, MD ED   03/16/2016 1325 03/19/2016 0125 Full Code [id number]  Renne Musca, MD Inpatient   03/16/2016 0403 03/16/2016 1325 Full Code [id number]  Cherylann Ratel, RN Inpatient   09/01/2015 2046 09/03/2015 1612 Full Code [id number]  Antony Madura, PA-C ED   08/30/2015 0509 08/30/2015 2256 Full Code [id number]  [name], MD ED   08/26/2015 0210 08/29/2015 1750 Full Code [id number]  Roxy Horseman, PA-C ED   07/27/2015 2348 07/28/2015 0603 Full Code [id number]  Marily Memos, MD ED   07/15/2015 1855 07/18/2015 1739 Full Code [id number]  Jimmy Footman, MD Inpatient   07/10/2015 0819 07/15/2015  1855 Full Code [id number]  Michael Litter, MD Inpatient   02/24/2015 1255 02/24/2015 1642 Full Code [id number]  Gerhard Munch, MD ED   01/17/2015 1930 01/20/2015 1554 Full Code [id number]  Lorre Nick, MD ED   06/22/2014 2215 07/21/2014 1548 Full Code [id number]  Harle Battiest, NP ED   03/18/2014 0237 03/30/2014 0251 Full Code [id number]  Kerry Hough, PA-C Inpatient   03/16/2014 1714 03/18/2014 0237 Full Code [id number]  Arby Barrette, MD ED   03/09/2014 1535 03/16/2014 1714 Full Code [id number]  Kristeen Mans, NP Inpatient   03/08/2014 0010 03/09/2014 1535 Full Code [id number]  Jeannetta Ellis, PA-C ED   01/08/2014 2301 01/10/2014 0005 Full Code [id number]  Marcelene Butte ED   10/31/2013 1631 11/02/2013 2247 Full Code [id number]  Arthor Captain, PA-C ED   10/24/2013 2019 10/26/2013 2104 Full Code [id number]  Louann Sjogren, PA-C ED   09/13/2013 0123 09/13/2013 2326 Full Code [id number]  Arman Filter, NP ED   09/12/2013 2128 09/13/2013 0123 Full Code [id number]  Arman Filter, NP ED         IV Access:   Peripheral IV   Procedures and diagnostic studies:   No results found.   Medical Consultants:   None.   Subjective:    [name] no complaints  Objective:    Vitals:   06/05/22 1631 06/05/22 2109 06/06/22 0616 06/06/22 0826  BP: 109/62 108/66 106/75   Pulse: 71 71 70   Resp: 17 17 16    Temp:  97.8 F (36.6 C) 98.2 F (36.8 C)   TempSrc:  Oral Oral   SpO2: 98% 99% 98% 99%  Weight:      Height:       SpO2: 99 %   Intake/Output Summary (Last 24 hours) at 06/06/2022 1114 Last data filed at 06/06/2022 0930 Gross per 24 hour  Intake 1543 ml  Output --  Net 1543 ml   Filed Weights   06/04/22 2320  Weight: 95.3 kg    Exam: General exam: In no acute distress. Respiratory system: Good air movement and clear to auscultation. Cardiovascular system: S1 & S2 heard, RRR. No JVD. Gastrointestinal system: Abdomen is nondistended, soft and nontender.   Extremities: No pedal edema. Skin: No  rashes, lesions or ulcers Psychiatry: Judgement and insight appear normal. Mood & affect appropriate.    Data Reviewed:    Labs: Basic Metabolic Panel: Recent Labs  Lab 06/04/22 2332 06/05/22 0331 06/05/22 0941  NA 138 142  --   K 3.3* 3.5  --   CL 108 106  --   CO2 23  --   --   GLUCOSE 119* 107*  --   BUN 10 10  --   CREATININE 0.79 0.70 0.69  CALCIUM 8.7*  --   --   MG 2.1  --   --    GFR Estimated Creatinine Clearance: 112 mL/min (by C-G formula based on SCr of 0.69 mg/dL). Liver Function Tests: Recent Labs  Lab 06/04/22 2332  AST 19  ALT 15  ALKPHOS 68  BILITOT 0.5  PROT 7.2  ALBUMIN 3.7   No results for input(s): "LIPASE", "AMYLASE" in the last 168 hours. No results for input(s): "AMMONIA" in the last 168 hours. Coagulation profile No results for input(s): "INR", "PROTIME" in the last 168 hours. COVID-19 Labs  Recent Labs    06/05/22 0941  FERRITIN 12    Lab Results  Component Value Date   SARSCOV2NAA NEGATIVE 05/27/2022   SARSCOV2NAA NEGATIVE 05/21/2022   SARSCOV2NAA NEGATIVE 05/15/2022   SARSCOV2NAA NEGATIVE 05/04/2022    CBC: Recent Labs  Lab 06/04/22 2332 06/05/22 0331 06/05/22 0941  WBC 6.6  --  7.1  NEUTROABS 3.8  --   --   HGB 10.9* 10.9* 10.5*  HCT 34.6* 32.0* 33.8*  MCV 80.1  --  82.2  PLT 254  --  259   Cardiac Enzymes: No results for input(s): "CKTOTAL", "CKMB", "CKMBINDEX", "TROPONINI" in the last 168 hours. BNP (last 3 results) No results for input(s): "PROBNP" in the last 8760 hours. CBG: Recent Labs  Lab 06/04/22 2339  GLUCAP 109*   D-Dimer: No results for input(s): "DDIMER" in the last 72 hours. Hgb A1c: No results for input(s): "HGBA1C" in the last 72 hours. Lipid Profile: No results for input(s): "CHOL", "HDL", "LDLCALC", "TRIG", "CHOLHDL", "LDLDIRECT" in the last 72 hours. Thyroid function studies: No results for input(s): "TSH", "T4TOTAL", "T3FREE", "THYROIDAB" in  the last 72 hours.  Invalid input(s): "FREET3" Anemia work up: Recent Labs    06/05/22 0941  VITAMINB12 3,066*  FOLATE 13.2  FERRITIN 12  TIBC 396  IRON 35  RETICCTPCT 1.6   Sepsis Labs: Recent Labs  Lab 06/04/22 2332 06/05/22 0941  WBC 6.6 7.1   Microbiology No results found for this or any previous visit (from the past 240 hour(s)).   Medications:    cyclobenzaprine  10 mg Oral QHS   enoxaparin (LOVENOX) injection  40 mg Subcutaneous Q24H   lidocaine  1 patch Transdermal Q24H   mometasone-formoterol  2 puff Inhalation BID   pantoprazole  40 mg Oral Daily   Continuous Infusions:    LOS: 1 day   Marinda Elk  [facility]  06/06/2022, 11:14 AM

## 2022-06-06 NOTE — Progress Notes (Signed)
Nutrition Brief Note  Patient identified on the Malnutrition Screening Tool (MST) Report  No weight loss noted in weight records. Pt consuming 100% of meals at this time.   Wt Readings from Last 15 Encounters:  06/04/22 95.3 kg  05/25/22 95.3 kg  05/13/22 90.7 kg  05/13/22 90.7 kg  05/09/22 90.7 kg  05/06/22 86.2 kg  05/02/22 87 kg  04/14/22 87 kg  04/13/22 87.1 kg  03/03/22 92 kg  03/02/22 92 kg  02/15/22 92.5 kg  12/29/21 96.1 kg  11/29/21 90.7 kg  11/01/21 90.7 kg    Body mass index is 36.06 kg/m. Patient meets criteria for obesity based on current BMI.   Current diet order is regular, patient is consuming approximately 100% of meals at this time. Labs and medications reviewed.   No nutrition interventions warranted at this time. If nutrition issues arise, please consult RD.   Tilda Franco, MS, RD, LDN Inpatient Clinical Dietitian Contact information available via Amion

## 2022-06-06 NOTE — TOC Initial Note (Signed)
Transition of Care [name]) - Initial/Assessment Note    Patient Details  Name: [name] MRN: [id number] Date of Birth: 03-13-1990  [facility] CM/SW Contact:    Otelia Santee, LCSW Phone Number: 06/06/2022, 3:40 PM  Clinical Narrative:                 Pt recommended for inpatient psychiatric admission. Currently no beds available at [facility]. Referrals have been faxed out to the following facilities for review:   [facility]   [facility]   [facility]   [facility]   [facility]   [facility]   [facility]   [facility]   [facility]   [facility]   [facility]   [facility]   [facility]   [facility]     Expected Discharge Plan: Psychiatric Hospital Barriers to Discharge: Psych Bed not available   Patient Goals and CMS Choice Patient states their goals for this hospitalization and ongoing recovery are:: Unable to obtain CMS Medicare.gov Compare Post Acute Care list provided to::  (NA) Choice offered to / list presented to :  (NA) [place] ownership interest in [facility].provided to::  (NA)    Expected Discharge Plan and Services In-house Referral: Clinical Social Work Discharge Planning Services: NA Post Acute Care Choice: NA Living arrangements for the past 2 months: Apartment                 DME Arranged: N/A DME Agency: NA                  Prior Living Arrangements/Services Living arrangements for the past 2 months: Apartment Lives with:: Self, Domestic Partner Patient language and need for interpreter reviewed:: Yes Do you feel safe going back to the place where you live?: Yes      Need for  Family Participation in Patient Care: No (Comment) Care giver support system in place?: No (comment)   Criminal Activity/Legal Involvement Pertinent to Current Situation/Hospitalization: No - Comment as needed  Activities of Daily Living Home Assistive Devices/Equipment: None ADL Screening (condition at time of admission) Patient's cognitive ability adequate to safely complete daily activities?: Yes Is the patient deaf or have difficulty hearing?: No Does the patient have difficulty seeing, even when wearing glasses/contacts?: No Does the patient have difficulty concentrating, remembering, or making decisions?: No Patient able to express need for assistance with ADLs?: Yes Does the patient have difficulty dressing or bathing?: No Independently performs ADLs?: Yes (appropriate for developmental age) Does the patient have difficulty walking or climbing stairs?: No Weakness of Legs: None Weakness of Arms/Hands: None  Permission Sought/Granted   Permission granted to share information with : No              Emotional Assessment Appearance:: Appears stated age Attitude/Demeanor/Rapport: Unable to Assess Affect (typically observed): Unable to Assess Orientation: : Oriented to Self, Oriented to Place, Oriented to  Time, Oriented to Situation Alcohol / Substance Use: Not Applicable Psych Involvement: Yes (comment)  Admission diagnosis:  Intentional drug overdose (HCC) [T50.902A] Intentional overdose, initial encounter [facility]) [T50.902A] Patient Active Problem List   Diagnosis Date Noted   Intentional drug overdose (HCC) 06/05/2022   Normocytic anemia 06/05/2022   Ineffective individual coping 05/16/2022   Skin erythema 04/27/2022   Passive suicidal ideations  04/14/2022   Malingering 02/22/2022   Adjustment disorder with mixed anxiety and depressed mood 01/31/2022   Insomnia 01/12/2022   Pain in joint, ankle and foot 01/12/2022   Suicide attempt by cutting of wrist (HCC)  10/11/2021   Fibromyalgia 10/07/2021   Suicidal behavior 07/25/2021   Bipolar 1 disorder, depressed, severe (HCC) 07/25/2021   Overdose, intentional self-harm, initial encounter (HCC) 07/20/2021   Self-injurious behavior 07/19/2021   Bipolar I disorder, current or most recent episode depressed, with psychotic features (HCC) 07/05/2021   Suicide attempt (HCC) 07/04/2021   Suicide (HCC) 07/01/2021   Suicidal ideations 06/27/2021   Purposeful non-suicidal drug ingestion (HCC) 06/27/2021   GAD (generalized anxiety disorder) 04/22/2021   Paranoia (HCC) 04/22/2021   Adjustment disorder with mixed disturbance of emotions and conduct 08/03/2019   Overdose 07/22/2017   Intentional acetaminophen overdose (HCC)    DUB (dysfunctional uterine bleeding) 11/22/2016   Hyperprolactinemia (HCC) 08/20/2016   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   Seizure disorder (HCC) 08/08/2015   Migraines 07/27/2015   Asthma 04/15/2015   Schizoaffective disorder, bipolar type (HCC) 03/10/2014   PTSD (post-traumatic stress disorder) 03/10/2014   Suicidal ideation    Borderline personality disorder (HCC) 10/31/2013   Asperger syndrome 06/15/2013   PCP:  [facility] Pharmacy:   [id number] - [place], [place] - [address] [place] [address] Phone: [phone] Fax: [phone]     Social Determinants of Health (SDOH) Social History: SDOH Screenings   Food Insecurity: No Food Insecurity (05/06/2022)  Housing: Low Risk  (05/06/2022)  Transportation Needs: Unmet Transportation Needs (05/06/2022)  Utilities: Not At Risk (06/05/2022)  Alcohol Screen: Low Risk  (04/19/2022)  Depression (PHQ2-9): Medium Risk (03/10/2022)  Tobacco Use: Medium Risk (06/04/2022)   SDOH Interventions: Transportation Interventions: Inpatient TOC, Intervention Not Indicated   Readmission Risk Interventions    06/06/2022    3:38 PM  Readmission Risk Prevention Plan   Transportation Screening Complete  Medication Review (RN Care Manager) Complete  PCP or Specialist appointment within 3-5 days of discharge Complete  HRI or Home Care Consult Complete  SW Recovery Care/Counseling Consult Complete  Palliative Care Screening Not Applicable  Skilled Nursing Facility Not Applicable

## 2022-06-07 ENCOUNTER — Encounter (HOSPITAL_COMMUNITY): Payer: Self-pay | Admitting: Family

## 2022-06-07 ENCOUNTER — Inpatient Hospital Stay (HOSPITAL_COMMUNITY)
Admission: AD | Admit: 2022-06-07 | Discharge: 2022-06-11 | DRG: 885 | Disposition: A | Discharge status: Home / Self Care | Payer: Medicare PPO | Source: Intra-hospital | Attending: Psychiatry | Admitting: Psychiatry

## 2022-06-07 DIAGNOSIS — R451 Restlessness and agitation: Secondary | ICD-10-CM | POA: Diagnosis present

## 2022-06-07 DIAGNOSIS — F250 Schizoaffective disorder, bipolar type: Secondary | ICD-10-CM | POA: Diagnosis present

## 2022-06-07 DIAGNOSIS — F332 Major depressive disorder, recurrent severe without psychotic features: Secondary | ICD-10-CM | POA: Diagnosis present

## 2022-06-07 DIAGNOSIS — K219 Gastro-esophageal reflux disease without esophagitis: Secondary | ICD-10-CM | POA: Diagnosis present

## 2022-06-07 DIAGNOSIS — K5909 Other constipation: Secondary | ICD-10-CM | POA: Diagnosis present

## 2022-06-07 DIAGNOSIS — G4700 Insomnia, unspecified: Secondary | ICD-10-CM | POA: Diagnosis present

## 2022-06-07 DIAGNOSIS — J45909 Unspecified asthma, uncomplicated: Secondary | ICD-10-CM | POA: Diagnosis present

## 2022-06-07 DIAGNOSIS — T43222A Poisoning by selective serotonin reuptake inhibitors, intentional self-harm, initial encounter: Secondary | ICD-10-CM | POA: Diagnosis present

## 2022-06-07 DIAGNOSIS — Z79899 Other long term (current) drug therapy: Secondary | ICD-10-CM

## 2022-06-07 DIAGNOSIS — M797 Fibromyalgia: Secondary | ICD-10-CM | POA: Diagnosis present

## 2022-06-07 DIAGNOSIS — T43212A Poisoning by selective serotonin and norepinephrine reuptake inhibitors, intentional self-harm, initial encounter: Secondary | ICD-10-CM | POA: Diagnosis present

## 2022-06-07 DIAGNOSIS — K30 Functional dyspepsia: Secondary | ICD-10-CM | POA: Diagnosis present

## 2022-06-07 DIAGNOSIS — F4310 Post-traumatic stress disorder, unspecified: Secondary | ICD-10-CM | POA: Diagnosis present

## 2022-06-07 DIAGNOSIS — Z818 Family history of other mental and behavioral disorders: Secondary | ICD-10-CM | POA: Diagnosis not present

## 2022-06-07 DIAGNOSIS — T471X2A Poisoning by other antacids and anti-gastric-secretion drugs, intentional self-harm, initial encounter: Secondary | ICD-10-CM | POA: Diagnosis present

## 2022-06-07 DIAGNOSIS — Z148 Genetic carrier of other disease: Secondary | ICD-10-CM | POA: Diagnosis not present

## 2022-06-07 DIAGNOSIS — F603 Borderline personality disorder: Secondary | ICD-10-CM | POA: Diagnosis present

## 2022-06-07 DIAGNOSIS — R000 Tachycardia, unspecified: Secondary | ICD-10-CM | POA: Diagnosis present

## 2022-06-07 DIAGNOSIS — Z5982 Transportation insecurity: Secondary | ICD-10-CM

## 2022-06-07 DIAGNOSIS — T447X2A Poisoning by beta-adrenoreceptor antagonists, intentional self-harm, initial encounter: Secondary | ICD-10-CM | POA: Diagnosis present

## 2022-06-07 DIAGNOSIS — T43592A Poisoning by other antipsychotics and neuroleptics, intentional self-harm, initial encounter: Secondary | ICD-10-CM | POA: Diagnosis present

## 2022-06-07 DIAGNOSIS — T446X2A Poisoning by alpha-adrenoreceptor antagonists, intentional self-harm, initial encounter: Secondary | ICD-10-CM | POA: Diagnosis present

## 2022-06-07 DIAGNOSIS — Z87891 Personal history of nicotine dependence: Secondary | ICD-10-CM

## 2022-06-07 DIAGNOSIS — T481X2A Poisoning by skeletal muscle relaxants [neuromuscular blocking agents], intentional self-harm, initial encounter: Secondary | ICD-10-CM | POA: Diagnosis present

## 2022-06-07 DIAGNOSIS — T50902A Poisoning by unspecified drugs, medicaments and biological substances, intentional self-harm, initial encounter: Secondary | ICD-10-CM | POA: Diagnosis not present

## 2022-06-07 MED ORDER — IBUPROFEN 400 MG PO TABS
400.0000 mg | ORAL_TABLET | Freq: Four times a day (QID) | ORAL | Status: DC | PRN
  Administered 2022-06-07 – 2022-06-11 (×5): 400 mg via ORAL
  Filled 2022-06-07 (×5): qty 1

## 2022-06-07 MED ORDER — PREGABALIN 100 MG PO CAPS
100.0000 mg | ORAL_CAPSULE | Freq: Three times a day (TID) | ORAL | Status: DC
  Administered 2022-06-08 – 2022-06-11 (×10): 100 mg via ORAL
  Filled 2022-06-07 (×10): qty 1

## 2022-06-07 MED ORDER — MOMETASONE FURO-FORMOTEROL FUM 200-5 MCG/ACT IN AERO
2.0000 | INHALATION_SPRAY | Freq: Two times a day (BID) | RESPIRATORY_TRACT | Status: DC
  Administered 2022-06-08 – 2022-06-10 (×5): 2 via RESPIRATORY_TRACT
  Filled 2022-06-07: qty 8.8

## 2022-06-07 MED ORDER — TRAZODONE HCL 50 MG PO TABS
25.0000 mg | ORAL_TABLET | Freq: Every evening | ORAL | Status: DC | PRN
  Administered 2022-06-07: 25 mg via ORAL
  Filled 2022-06-07: qty 1

## 2022-06-07 MED ORDER — DIPHENHYDRAMINE HCL 50 MG/ML IJ SOLN: 50.0000 mg | Freq: Three times a day (TID) | INTRAMUSCULAR | Status: DC | PRN

## 2022-06-07 MED ORDER — ALUM & MAG HYDROXIDE-SIMETH 200-200-20 MG/5ML PO SUSP: 30.0000 mL | ORAL | Status: DC | PRN

## 2022-06-07 MED ORDER — HYDROXYZINE HCL 10 MG PO TABS
10.0000 mg | ORAL_TABLET | Freq: Once | ORAL | Status: AC | PRN
  Administered 2022-06-07: 10 mg via ORAL
  Filled 2022-06-07: qty 1

## 2022-06-07 MED ORDER — PANTOPRAZOLE SODIUM 40 MG PO TBEC
40.0000 mg | DELAYED_RELEASE_TABLET | Freq: Every day | ORAL | Status: DC
  Administered 2022-06-08 – 2022-06-11 (×4): 40 mg via ORAL
  Filled 2022-06-07 (×5): qty 1

## 2022-06-07 MED ORDER — LIDOCAINE 5 % EX PTCH
1.0000 | MEDICATED_PATCH | CUTANEOUS | Status: DC
  Filled 2022-06-07 (×4): qty 1

## 2022-06-07 MED ORDER — DIPHENHYDRAMINE HCL 25 MG PO CAPS: 50.0000 mg | ORAL_CAPSULE | Freq: Three times a day (TID) | ORAL | Status: DC | PRN

## 2022-06-07 MED ORDER — LORAZEPAM 2 MG/ML IJ SOLN: 2.0000 mg | Freq: Three times a day (TID) | INTRAMUSCULAR | Status: DC | PRN

## 2022-06-07 MED ORDER — LORAZEPAM 1 MG PO TABS: 2.0000 mg | ORAL_TABLET | Freq: Three times a day (TID) | ORAL | Status: DC | PRN

## 2022-06-07 MED ORDER — METOPROLOL SUCCINATE ER 25 MG PO TB24
25.0000 mg | ORAL_TABLET | Freq: Every day | ORAL | Status: DC
  Administered 2022-06-08: 25 mg via ORAL
  Filled 2022-06-07 (×3): qty 1

## 2022-06-07 MED ORDER — CYCLOBENZAPRINE HCL 10 MG PO TABS
10.0000 mg | ORAL_TABLET | Freq: Every day | ORAL | Status: DC
  Administered 2022-06-09 – 2022-06-10 (×2): 10 mg via ORAL
  Filled 2022-06-07 (×4): qty 1

## 2022-06-07 MED ORDER — PRAZOSIN HCL 1 MG PO CAPS
1.0000 mg | ORAL_CAPSULE | Freq: Every day | ORAL | Status: DC
  Filled 2022-06-07 (×5): qty 1

## 2022-06-07 MED ORDER — POLYETHYLENE GLYCOL 3350 17 G PO PACK
17.0000 g | PACK | Freq: Two times a day (BID) | ORAL | Status: DC
  Administered 2022-06-07: 17 g via ORAL
  Filled 2022-06-07: qty 1

## 2022-06-07 MED ORDER — OLANZAPINE 5 MG PO TABS
5.0000 mg | ORAL_TABLET | Freq: Every day | ORAL | Status: DC
  Administered 2022-06-07: 5 mg via ORAL
  Filled 2022-06-07 (×4): qty 1

## 2022-06-07 MED ORDER — ESCITALOPRAM OXALATE 10 MG PO TABS
10.0000 mg | ORAL_TABLET | Freq: Every day | ORAL | Status: DC
  Administered 2022-06-08 – 2022-06-11 (×4): 10 mg via ORAL
  Filled 2022-06-07 (×5): qty 1

## 2022-06-07 MED ORDER — MAGNESIUM HYDROXIDE 400 MG/5ML PO SUSP
30.0000 mL | Freq: Every day | ORAL | Status: DC | PRN
  Administered 2022-06-08: 30 mL via ORAL
  Filled 2022-06-07: qty 30

## 2022-06-07 NOTE — Progress Notes (Signed)
Patient discharged via [facility]. Belongings and paperwork given to transporter.

## 2022-06-07 NOTE — TOC Transition Note (Signed)
Transition of Care [facility]) - CM/SW Discharge Note   Patient Details  Name: [name] MRN: [id number] Date of Birth: 05-26-1990  Transition of [facility]) CM/SW Contact:  Otelia Santee, LCSW Phone Number: 06/07/2022, 4:08 PM   Clinical Narrative:    Per Malva Limes [facility], pt has been accepted to [facility]. Bed number is 405-2 Accepting provider is  Massengill, MD. Number for report is [phone]. The pt may arrive after 8:00pm. Pt will be transported via [facility] by calling [phone].         Final next level of care: Psychiatric Hospital Barriers to Discharge: Barriers Resolved   Patient Goals and CMS Choice CMS Medicare.gov Compare Post Acute Care list provided to::  (NA) Choice offered to / list presented to :  (NA)  Discharge Placement                         Discharge Plan and Services Additional resources added to the After Visit Summary for   In-house Referral: Clinical Social Work Discharge Planning Services: NA Post Acute Care Choice: NA          DME Arranged: N/A DME Agency: NA                  Social Determinants of Health (SDOH) Interventions SDOH Screenings   Food Insecurity: No Food Insecurity (05/06/2022)  Housing: Low Risk  (05/06/2022)  Transportation Needs: Unmet Transportation Needs (05/06/2022)  Utilities: Not At Risk (06/05/2022)  Alcohol Screen: Low Risk  (04/19/2022)  Depression (PHQ2-9): Medium Risk (03/10/2022)  Tobacco Use: Medium Risk (06/04/2022)     Readmission Risk Interventions    06/06/2022    3:38 PM  Readmission Risk Prevention Plan  Transportation Screening Complete  Medication Review (RN Care Manager) Complete  PCP or Specialist appointment within 3-5 days of discharge Complete  HRI or Home Care Consult Complete  SW Recovery Care/Counseling Consult Complete  Palliative Care Screening Not Applicable  Skilled Nursing Facility Not Applicable

## 2022-06-07 NOTE — Plan of Care (Signed)
D/C orders received. To be transported by [facility] after 2000 this evening. Personal belongings will be given to transport.

## 2022-06-07 NOTE — Progress Notes (Signed)
Primary RN called report to accepting RN Erskine Squibb at [phone].

## 2022-06-07 NOTE — Consult Note (Signed)
[facility] Face-to-Face Psychiatry Consult   Reason for Consult:  Suicidal Ideations  Referring Physician:  EPD Patient Identification: [name] MRN:  [id number] Principal Diagnosis: Intentional drug overdose [facility]) Diagnosis:  Principal Problem:   Intentional drug overdose (HCC) Active Problems:   Schizoaffective disorder, bipolar type (HCC)   GAD (generalized anxiety disorder)   Suicide attempt (HCC)   Normocytic anemia   Total Time spent with patient: 1 hour  Subjective:   [name] is a 32 y.o. female presents with chronic suicidal ideation. Patient is diagnosed with Schizoaffective, borderline personality disorder and major depressive disorder who presents for suicide attempt by overdose. [name] is well know to the Psychiatric Consult service for her frequent admissions, ER visits and chronic suicidal ideations. She states her boyfriend triggered her which lead to this suicide attempt. She has a history of blaming others for her actions. She is currently minimizing her most recent suicide attempt noting that she has multiple plans in place and a 76 year old daughter that depends on her. Despite previous safety plans, mitigation of suicide risks, and treatment adherence; she continues to remain a danger to herself at this time. She endorses struggling with the concept of inpatient psychiatric hospitalization and would like to work on safety proof plan.   32 yo female with a history detailed above who presented to the ED after having suicide attempt by overdose. Of note, patient overdosed on multiple medications approximately 10 days ago. Psychosocial stressors include boyfriend, regaining custody of daughter (age 36), and working.  Patient was forward thinking throughout this hospitalization and expressed wanting to seek inpatient treatment in order to get herself together. She is future oriented as she requests to pay her bills prior to inpatient admission. Patient was  amenable to [facility] and was subsequently accepted.   Due to patient meeting criteria for MDD and severity of suicidality with past SA, patient was restarted on home medications except her Vraylar, which currently has a half life of about 1 week.   Currently on interview, patient is alert and oriented x 4. She is awake and cooperative, pleasant and observed to be lying in her bed. She reports having a good day just "tired and sleeping more than usual". She states she is going to remain in the hospital until she goes to inpatient psych, and feel like she needs to. The patient denies symptoms of psychosis. Endorses prior psychiatric diagnoses of polysubstance use disorder, depression, bipolar disorder and previous psychiatric medication trials of Zyprexa, Abilify, Risperdal, Wellbutrin, Prozac, BuSpar, Haldol, and Seroquel. Denies current withdrawal symptoms, suicidal ideation, homicidal ideation, auditory/visual hallucination, and self-harm. Endorses prior multiple psychiatric hospitalization for suicide attempts.  Patient endorses adequate appetite and denies sleep disturbance. Patient denies a history of seizure, denies a history of SI/HI, and denies a history of suicide attempt.       Past Psychiatric History: past psychiatric history significant for autism, anxiety, depression, and schizoaffective disorder (bipolar type), and borderline personality.  Last hospitalization 04/2021 at [facility]. Multiple admissions and ER visits.  Suicide attempts "tto many to count about 20 x, they been so simple. "   Risk to Self:  Denies currently, although she is here for suicide attempt Risk to Others:  Denies Prior Inpatient Therapy:  multiple hospitalizations. [facility] 04/2022; Dec 2023 Prior Outpatient Therapy:  [facility]   Past Medical History:  Past Medical History:  Diagnosis Date   Acid reflux    Anxiety    Asthma  last attack 03/13/15 or 03/14/15   Autism    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    Chronic constipation    Depression    Drug-seeking behavior    Essential tremor    Headache    Overdose of acetaminophen 07/2017   and other meds   Personality disorder (HCC)    Schizo-affective psychosis (HCC)    Schizoaffective disorder, bipolar type (HCC)    Seizures (HCC)    Last seizure December 2017   Sleep apnea     Past Surgical History:  Procedure Laterality Date   MOUTH SURGERY  2009 or 2010   Family History:  Family History  Problem Relation Age of Onset   Mental illness Father    Asthma Father    PDD Brother    Seizures Brother    Family Psychiatric  History:  Social History:  Social History   Substance and Sexual Activity  Alcohol Use No   Alcohol/week: 1.0 standard drink of alcohol   Types: 1 Standard drinks or equivalent per week   Comment: denies at this time     Social History   Substance and Sexual Activity  Drug Use No   Comment: History of cocaine use at age 61 for 4 months    Social History   Socioeconomic History   Marital status: Widowed    Spouse name: Not on file   Number of children: 0   Years of education: Not on file   Highest education level: Not on file  Occupational History   Occupation: disability  Tobacco Use   Smoking status: Former    Packs/day: 0    Types: Cigarettes   Smokeless tobacco: Never   Tobacco comments:    Smoked for 2  years age [age]  Vaping Use   Vaping Use: Never used  Substance and Sexual Activity   Alcohol use: No    Alcohol/week: 1.0 standard drink of alcohol    Types: 1 Standard drinks or equivalent per week    Comment: denies at this time   Drug use: No    Comment: History of cocaine use at age 48 for 4 months   Sexual activity: Not Currently    Birth control/protection: None  Other Topics Concern   Not on file  Social History Narrative   Marital status: Widowed      Children: daughter      Lives: with boyfriend, in two story home      Employment:  Disability      Tobacco:  quit smoking; smoked for two years.      Alcohol ;none      Drugs: none   Has not traveled outside of the country.   Right handed         Social Determinants of Health   Financial Resource Strain: Not on file  Food Insecurity: No Food Insecurity (05/06/2022)   Hunger Vital Sign    Worried About Running Out of Food in the Last Year: Never true    Ran Out of Food in the Last Year: Never true  Transportation Needs: Unmet Transportation Needs (05/06/2022)   PRAPARE - Administrator, Civil Service (Medical): Yes    Lack of Transportation (Non-Medical): Yes  Physical Activity: Not on file  Stress: Not on file  Social Connections: Not on file   Additional Social History:    Allergies:   Allergies  Allergen Reactions   Bee Venom Anaphylaxis   Coconut Flavor Anaphylaxis and Rash  Fish Allergy Anaphylaxis   Geodon [Ziprasidone Hcl] Other (See Comments)    Pt states that this medication causes paralysis of the mouth.     Haloperidol And Related Other (See Comments)    Pt states that this medication causes paralysis of the mouth, jaw locks up   Lithobid [Lithium] Other (See Comments)    Seizure-like activity    Roxicodone [Oxycodone] Other (See Comments)    Hallucinations    Seroquel [Quetiapine] Other (See Comments)    Severe drowsiness Pt currently taking 12.5 mg BID, she is ok with this dose   Shellfish Allergy Anaphylaxis   Phenergan [Promethazine Hcl] Other (See Comments)    Chest pain     Prilosec [Omeprazole] Nausea And Vomiting    Pt can take protonix with no problems    Sulfa Antibiotics Other (See Comments)    Chest pain    Tegretol [Carbamazepine] Nausea And Vomiting   Prozac [Fluoxetine] Other (See Comments)    Increased Depression Suicidal thoughts   Tape Other (See Comments)    Skin tears, can only tolerate paper tape.   Tylenol [Acetaminophen] Rash and Other (See Comments)    Rash on face     Labs:  No results found for this or any previous  visit (from the past 48 hour(s)).   Current Facility-Administered Medications  Medication Dose Route Frequency Provider Last Rate Last Admin   albuterol (PROVENTIL) (2.5 MG/3ML) 0.083% nebulizer solution 2.5 mg  2.5 mg Nebulization Q2H PRN Kirby Crigler, Mir M, MD       cyclobenzaprine (FLEXERIL) tablet 10 mg  10 mg Oral QHS Kirby Crigler, Mir M, MD   10 mg at 06/06/22 2142   enoxaparin (LOVENOX) injection 40 mg  40 mg Subcutaneous Q24H Kirby Crigler, Mir M, MD       hydrOXYzine (ATARAX) tablet 10 mg  10 mg Oral Once PRN Luiz Iron, NP       ibuprofen (ADVIL) tablet 400 mg  400 mg Oral Q6H PRN Kirby Crigler, Mir M, MD   400 mg at 06/06/22 1509   lidocaine (LIDODERM) 5 % 1 patch  1 patch Transdermal Q24H Kirby Crigler, Mir M, MD       mometasone-formoterol [facility]) 200-5 MCG/ACT inhaler 2 puff  2 puff Inhalation BID Kirby Crigler, Mir M, MD   2 puff at 06/06/22 2024   ondansetron (ZOFRAN) tablet 4 mg  4 mg Oral Q6H PRN Kirby Crigler, Mir M, MD       Or   ondansetron [facility]) injection 4 mg  4 mg Intravenous Q6H PRN Kirby Crigler, Mir M, MD   4 mg at 06/06/22 1919   pantoprazole (PROTONIX) EC tablet 40 mg  40 mg Oral Daily Kirby Crigler, Mir M, MD   40 mg at 06/07/22 0836   polyethylene glycol (MIRALAX / GLYCOLAX) packet 17 g  17 g Oral BID Marinda Elk, MD   17 g at 06/07/22 1026   pregabalin (LYRICA) capsule 100 mg  100 mg Oral TID Marinda Elk, MD   100 mg at 06/07/22 1551   traZODone (DESYREL) tablet 25 mg  25 mg Oral QHS PRN Maryln Gottron, MD   25 mg at 06/06/22 2142    Musculoskeletal: Strength & Muscle Tone: within normal limits Gait & Station: normal Patient leans: N/A Psychiatric Specialty Exam: Physical Exam Vitals and nursing note reviewed. Exam conducted with a chaperone present.  Constitutional:      Appearance: Normal appearance. She is obese.  Neurological:     General: No focal deficit present.  Mental Status: She is alert and oriented to person, place, and time.  Mental status is at baseline.  Psychiatric:        Attention and Perception: Attention and perception normal.        Mood and Affect: Mood normal.        Speech: Speech normal.        Behavior: Behavior normal.        Thought Content: Thought content includes suicidal ideation.        Cognition and Memory: Cognition and memory normal.        Judgment: Judgment is impulsive.     Review of Systems  Psychiatric/Behavioral:  Positive for suicidal ideas. Negative for behavioral problems. The patient is nervous/anxious.     Blood pressure (!) 111/56, pulse 90, temperature 97.9 F (36.6 C), temperature source Oral, resp. rate 16, height 5\' 4"  (1.626 m), weight 95.3 kg, last menstrual period 04/25/2022, SpO2 98 %.Body mass index is 36.06 kg/m.  General Appearance: Fairly Groomed  Eye Contact:  Fair  Speech:  Normal Rate  Volume:  Normal  Mood:  Anxious  Affect:  Blunt and Congruent  Thought Process:  Coherent, Goal Directed, and Descriptions of Associations: Intact  Orientation:  Full (Time, Place, and Person)  Thought Content:  WDL  Suicidal Thoughts:  [place] is here for suicide attempt  Homicidal Thoughts:  No  Memory:  Immediate;   Good Recent;   Good Remote;   Good  Judgement:  Poor  Insight:  Lacking  Psychomotor Activity:  Normal  Concentration:  Concentration: Good and Attention Span: Good  Recall:  Good  Fund of Knowledge:  Good  Language:  Good  Akathisia:  No  Handed:  Right  AIMS (if indicated):     Assets:  Communication Skills Desire for Improvement Financial Resources/Insurance Leisure Time Physical Health Social Support  ADL's:  Intact  Cognition:  WNL  Sleep:        The patient is at acutely elevated risk of suicide/dangerousness to others and further worsening of psychiatric condition. Risk factors for suicide for this patient include: current diagnosis of depression, agitation, suicide attempt leading to current admission, previous acts of self-harm,  past violence and chronic mood lability . Risk factors for violence for this patient include:  younger age, aggression, agitation, history of aggressive behavior and chronic impulsivity. Protective factors for this patient are limited to: motivation for treatment, supportive family, presence of an available support system, safe housing and support system in agreement with treatment recommendations. The patient does meet [facility] involuntary commitment criteria at this time. This was explained to the patient who voiced understanding.   Plan:  Recommend inpatient treatment when medically cleared.   Labs WNL; iron panel normal; B12 elevated 3066; and CBC chronic anemia. UDS negative. QTc prolongation-  QTc 480 on 04/30; do not resume psychotropic medications at this time. Continue current recommendations to replace mag and phos.  -Resume home medications at this time, with the exception of vraylar.  -Continue suicide sitter.   -Patient meets criteria for IVC if she attempts to leave.  She has signed voluntary consent.   Disposition: Recommend psychiatric Inpatient admission when medically cleared.  Maryagnes Amos, FNP 06/07/2022 4:07 PM

## 2022-06-07 NOTE — Discharge Summary (Signed)
Physician Discharge Summary  [name] JXB:[id number] DOB: 13-Aug-1990 DOA: 06/04/2022  [facility]: [name]  Admit date: 06/04/2022 Discharge date: 06/07/2022  Admitted From: Home Disposition:  Home  Recommendations for Outpatient Follow-up:  Follow up with PCP in 1-2 weeks   Home Health:No Equipment/Devices:None  Discharge Condition:Stable CODE STATUS:Full Diet recommendation: Heart Healthy  Brief/Interim Summary: 32 y.o. female past medical history of general anxiety PTSD, severe bipolar disorder multiple suicide attempt presents to the ED for 60 times this year for drug overdose reported she took 7 trazodone 7 Lexapro Protonix Flexeril Vistaril Zyprexa paroxetine and metoprolol   Discharge Diagnoses:  Principal Problem:   Intentional drug overdose (HCC) Active Problems:   Schizoaffective disorder, bipolar type (HCC)   GAD (generalized anxiety disorder)   Suicide attempt (HCC)   Normocytic anemia  Intentional drug overdose/suicidal attempt: She was started on IV glucagon as needed for bradycardia. Her psychotropic medications were held she was aggressively hydrated. Vitals remained stable after this. Psychiatry was consulted recommended inpatient psychiatric admission.  Schizoaffective disorder/bipolar disorder/general anxiety: She will resume her medications at behavioral health as an outpatient.  Further management per psychiatry.  Discharge Instructions  Discharge Instructions     Diet - low sodium heart healthy   Complete by: As directed    Increase activity slowly   Complete by: As directed       Allergies as of 06/07/2022       Reactions   Bee Venom Anaphylaxis   Coconut Flavor Anaphylaxis, Rash   Fish Allergy Anaphylaxis   Geodon [ziprasidone Hcl] Other (See Comments)   Pt states that this medication causes paralysis of the mouth.     Haloperidol And Related Other (See Comments)   Pt states that this medication causes paralysis of  the mouth, jaw locks up   Lithobid [lithium] Other (See Comments)   Seizure-like activity   Roxicodone [oxycodone] Other (See Comments)   Hallucinations    Seroquel [quetiapine] Other (See Comments)   Severe drowsiness Pt currently taking 12.5 mg BID, she is ok with this dose   Shellfish Allergy Anaphylaxis   Phenergan [promethazine Hcl] Other (See Comments)   Chest pain    Prilosec [omeprazole] Nausea And Vomiting   Pt can take protonix with no problems   Sulfa Antibiotics Other (See Comments)   Chest pain    Tegretol [carbamazepine] Nausea And Vomiting   Prozac [fluoxetine] Other (See Comments)   Increased Depression Suicidal thoughts   Tape Other (See Comments)   Skin tears, can only tolerate paper tape.   Tylenol [acetaminophen] Rash, Other (See Comments)   Rash on face         Medication List     STOP taking these medications    Tums E-X 750 750 MG chewable tablet Generic drug: calcium carbonate   Voltaren 1 % Gel Generic drug: diclofenac Sodium       TAKE these medications    albuterol 108 (90 Base) MCG/ACT inhaler Commonly known as: VENTOLIN HFA Inhale 2 puffs into the lungs every 6 (six) hours as needed for wheezing or shortness of breath.   Breyna 160-4.5 MCG/ACT inhaler Generic drug: budesonide-formoterol Inhale 1 puff into the lungs in the morning and at bedtime.   Corlanor 5 MG Tabs tablet Generic drug: ivabradine Take 5 mg by mouth 2 (two) times daily.   cyclobenzaprine 10 MG tablet Commonly known as: FLEXERIL Take 10 mg by mouth at bedtime.   escitalopram 10 MG tablet Commonly known as: LEXAPRO Take  1 tablet (10 mg total) by mouth daily for 14 days.   hydrOXYzine 25 MG tablet Commonly known as: ATARAX Take 25 mg twice daily by mouth as needed for anxiety. Take 100 mg by mouth for sleep as needed at bedtime. What changed:  how much to take how to take this when to take this reasons to take this additional instructions   ibuprofen  200 MG tablet Commonly known as: ADVIL Take 400 mg by mouth every 6 (six) hours as needed for mild pain or headache.   lidocaine 4 % Commonly known as: HM Lidocaine Patch Place 1 patch onto the skin daily. Apply to the affected area   Melatonin 3 MG Caps Take 3 mg by mouth at bedtime.   metoprolol succinate 25 MG 24 hr tablet Commonly known as: TOPROL-XL Take 1 tablet (25 mg total) by mouth daily.   OLANZapine 5 MG tablet Commonly known as: ZYPREXA Take 1 tablet (5 mg total) by mouth daily as needed for up to 7 days (anxiety). What changed: when to take this   pantoprazole 40 MG tablet Commonly known as: PROTONIX Take 1 tablet (40 mg total) by mouth daily for 14 days. What changed: when to take this   prazosin 1 MG capsule Commonly known as: MINIPRESS Take 1 capsule (1 mg total) by mouth at bedtime for 14 days.   pregabalin 100 MG capsule Commonly known as: LYRICA Take 100 mg by mouth 3 (three) times daily.   traZODone 50 MG tablet Commonly known as: DESYREL Take 1 tablet (50 mg total) by mouth at bedtime as needed for up to 7 days for sleep. What changed: when to take this   VITAMIN B-12 PO Take 1 capsule by mouth in the morning.   Vraylar 4.5 MG Caps Generic drug: Cariprazine HCl Take 4.5 mg by mouth in the morning. What changed: Another medication with the same name was removed. Continue taking this medication, and follow the directions you see here.        Allergies  Allergen Reactions   Bee Venom Anaphylaxis   Coconut Flavor Anaphylaxis and Rash   Fish Allergy Anaphylaxis   Geodon [Ziprasidone Hcl] Other (See Comments)    Pt states that this medication causes paralysis of the mouth.     Haloperidol And Related Other (See Comments)    Pt states that this medication causes paralysis of the mouth, jaw locks up   Lithobid [Lithium] Other (See Comments)    Seizure-like activity    Roxicodone [Oxycodone] Other (See Comments)    Hallucinations    Seroquel  [Quetiapine] Other (See Comments)    Severe drowsiness Pt currently taking 12.5 mg BID, she is ok with this dose   Shellfish Allergy Anaphylaxis   Phenergan [Promethazine Hcl] Other (See Comments)    Chest pain     Prilosec [Omeprazole] Nausea And Vomiting    Pt can take protonix with no problems    Sulfa Antibiotics Other (See Comments)    Chest pain    Tegretol [Carbamazepine] Nausea And Vomiting   Prozac [Fluoxetine] Other (See Comments)    Increased Depression Suicidal thoughts   Tape Other (See Comments)    Skin tears, can only tolerate paper tape.   Tylenol [Acetaminophen] Rash and Other (See Comments)    Rash on face     Consultations: Psychiatry   Procedures/Studies: DG Shoulder Left  Result Date: 06/02/2022 CLINICAL DATA:  Left shoulder pain, initial encounter EXAM: LEFT SHOULDER - 2+ VIEW COMPARISON:  None  Available. FINDINGS: There is no evidence of fracture or dislocation. There is no evidence of arthropathy or other focal bone abnormality. Soft tissues are unremarkable. IMPRESSION: No acute abnormality noted. Electronically Signed   By: Alcide Clever M.D.   On: 06/02/2022 02:24   DG Chest Port 1 View  Result Date: 05/10/2022 CLINICAL DATA:  Chest pain EXAM: PORTABLE CHEST 1 VIEW COMPARISON:  04/13/2022 FINDINGS: Low lung volumes accentuate cardiomediastinal silhouette and pulmonary vascularity. No focal consolidation, pleural effusion, or pneumothorax. No acute osseous abnormality. IMPRESSION: No active disease. Electronically Signed   By: Minerva Fester M.D.   On: 05/10/2022 01:29   (Echo, Carotid, EGD, Colonoscopy, ERCP)    Subjective: No complaints  Discharge Exam: Vitals:   06/06/22 2129 06/07/22 0638  BP: (!) 113/56 107/62  Pulse: 60 62  Resp: 18 16  Temp: 98.3 F (36.8 C) 97.6 F (36.4 C)  SpO2: 100% 100%   Vitals:   06/06/22 0616 06/06/22 0826 06/06/22 2129 06/07/22 0638  BP: 106/75  (!) 113/56 107/62  Pulse: 70  60 62  Resp: 16  18 16   Temp:  98.2 F (36.8 C)  98.3 F (36.8 C) 97.6 F (36.4 C)  TempSrc: Oral  Oral Oral  SpO2: 98% 99% 100% 100%  Weight:      Height:        General: Pt is alert, awake, not in acute distress Cardiovascular: RRR, S1/S2 +, no rubs, no gallops Respiratory: CTA bilaterally, no wheezing, no rhonchi Abdominal: Soft, NT, ND, bowel sounds + Extremities: no edema, no cyanosis    The results of significant diagnostics from this hospitalization (including imaging, microbiology, ancillary and laboratory) are listed below for reference.     Microbiology: No results found for this or any previous visit (from the past 240 hour(s)).   Labs: BNP (last 3 results) No results for input(s): "BNP" in the last 8760 hours. Basic Metabolic Panel: Recent Labs  Lab 06/04/22 2332 06/05/22 0331 06/05/22 0941  NA 138 142  --   K 3.3* 3.5  --   CL 108 106  --   CO2 23  --   --   GLUCOSE 119* 107*  --   BUN 10 10  --   CREATININE 0.79 0.70 0.69  CALCIUM 8.7*  --   --   MG 2.1  --   --    Liver Function Tests: Recent Labs  Lab 06/04/22 2332  AST 19  ALT 15  ALKPHOS 68  BILITOT 0.5  PROT 7.2  ALBUMIN 3.7   No results for input(s): "LIPASE", "AMYLASE" in the last 168 hours. No results for input(s): "AMMONIA" in the last 168 hours. CBC: Recent Labs  Lab 06/04/22 2332 06/05/22 0331 06/05/22 0941  WBC 6.6  --  7.1  NEUTROABS 3.8  --   --   HGB 10.9* 10.9* 10.5*  HCT 34.6* 32.0* 33.8*  MCV 80.1  --  82.2  PLT 254  --  259   Cardiac Enzymes: No results for input(s): "CKTOTAL", "CKMB", "CKMBINDEX", "TROPONINI" in the last 168 hours. BNP: Invalid input(s): "POCBNP" CBG: Recent Labs  Lab 06/04/22 2339  GLUCAP 109*   D-Dimer No results for input(s): "DDIMER" in the last 72 hours. Hgb A1c No results for input(s): "HGBA1C" in the last 72 hours. Lipid Profile No results for input(s): "CHOL", "HDL", "LDLCALC", "TRIG", "CHOLHDL", "LDLDIRECT" in the last 72 hours. Thyroid function  studies No results for input(s): "TSH", "T4TOTAL", "T3FREE", "THYROIDAB" in the last 72 hours.  Invalid input(s): "FREET3" Anemia work up Recent Labs    06/05/22 0941  VITAMINB12 3,066*  FOLATE 13.2  FERRITIN 12  TIBC 396  IRON 35  RETICCTPCT 1.6   Urinalysis    Component Value Date/Time   COLORURINE YELLOW 03/03/2022 1813   APPEARANCEUR HAZY (A) 03/03/2022 1813   LABSPEC 1.017 03/03/2022 1813   PHURINE 5.0 03/03/2022 1813   GLUCOSEU NEGATIVE 03/03/2022 1813   HGBUR NEGATIVE 03/03/2022 1813   BILIRUBINUR NEGATIVE 03/03/2022 1813   BILIRUBINUR negative 06/14/2015 0930   KETONESUR 5 (A) 03/03/2022 1813   PROTEINUR NEGATIVE 03/03/2022 1813   UROBILINOGEN 0.2 10/24/2015 1428   NITRITE NEGATIVE 03/03/2022 1813   LEUKOCYTESUR NEGATIVE 03/03/2022 1813   Sepsis Labs Recent Labs  Lab 06/04/22 2332 06/05/22 0941  WBC 6.6 7.1   Microbiology No results found for this or any previous visit (from the past 240 hour(s)).   SIGNED:   Marinda Elk, MD  [facility] 06/07/2022, 9:08 AM Pager   If 7PM-7AM, please contact night-coverage [email or web address] Password [email or web address]

## 2022-06-07 NOTE — Progress Notes (Signed)
Pt did not attend wrap-up group   

## 2022-06-08 ENCOUNTER — Other Ambulatory Visit: Payer: Self-pay

## 2022-06-08 ENCOUNTER — Encounter (HOSPITAL_COMMUNITY): Payer: Self-pay

## 2022-06-08 MED ORDER — BACITRACIN-NEOMYCIN-POLYMYXIN OINTMENT TUBE
TOPICAL_OINTMENT | Freq: Two times a day (BID) | CUTANEOUS | Status: DC
  Administered 2022-06-09 (×2): 1 via TOPICAL
  Filled 2022-06-08: qty 14.17

## 2022-06-08 MED ORDER — OLANZAPINE 5 MG PO TBDP
5.0000 mg | ORAL_TABLET | Freq: Three times a day (TID) | ORAL | Status: DC | PRN
  Administered 2022-06-08 – 2022-06-10 (×2): 5 mg via ORAL
  Filled 2022-06-08 (×4): qty 1

## 2022-06-08 MED ORDER — TRAZODONE HCL 50 MG PO TABS
50.0000 mg | ORAL_TABLET | Freq: Every evening | ORAL | Status: DC | PRN
  Administered 2022-06-09 – 2022-06-10 (×2): 50 mg via ORAL
  Filled 2022-06-08 (×4): qty 1

## 2022-06-08 MED ORDER — BACITRACIN-NEOMYCIN-POLYMYXIN OINTMENT TUBE
TOPICAL_OINTMENT | CUTANEOUS | Status: DC | PRN
  Filled 2022-06-08: qty 14.17

## 2022-06-08 MED ORDER — MELATONIN 3 MG PO TABS
3.0000 mg | ORAL_TABLET | Freq: Every day | ORAL | Status: DC
  Administered 2022-06-08 – 2022-06-10 (×3): 3 mg via ORAL
  Filled 2022-06-08 (×4): qty 1

## 2022-06-08 MED ORDER — OLANZAPINE 10 MG IM SOLR: 5.0000 mg | Freq: Three times a day (TID) | INTRAMUSCULAR | Status: DC | PRN

## 2022-06-08 MED ORDER — CARIPRAZINE HCL 1.5 MG PO CAPS
1.5000 mg | ORAL_CAPSULE | Freq: Every day | ORAL | Status: DC
  Administered 2022-06-10 – 2022-06-11 (×2): 1.5 mg via ORAL
  Filled 2022-06-08 (×3): qty 1

## 2022-06-08 MED ORDER — ALBUTEROL SULFATE HFA 108 (90 BASE) MCG/ACT IN AERS
2.0000 | INHALATION_SPRAY | RESPIRATORY_TRACT | Status: DC | PRN
  Administered 2022-06-09: 2 via RESPIRATORY_TRACT
  Filled 2022-06-08: qty 6.7

## 2022-06-08 MED ORDER — HYDROXYZINE HCL 25 MG PO TABS
25.0000 mg | ORAL_TABLET | Freq: Three times a day (TID) | ORAL | Status: DC | PRN
  Administered 2022-06-08 – 2022-06-10 (×5): 25 mg via ORAL
  Filled 2022-06-08 (×5): qty 1

## 2022-06-08 MED ORDER — IVABRADINE HCL 5 MG PO TABS
5.0000 mg | ORAL_TABLET | Freq: Two times a day (BID) | ORAL | Status: DC
  Administered 2022-06-09 – 2022-06-11 (×5): 5 mg via ORAL
  Filled 2022-06-08 (×8): qty 1

## 2022-06-08 MED ORDER — DICLOFENAC SODIUM 1 % EX GEL
2.0000 g | Freq: Two times a day (BID) | CUTANEOUS | Status: DC
  Administered 2022-06-08 – 2022-06-10 (×4): 2 g via TOPICAL
  Filled 2022-06-08 (×2): qty 100

## 2022-06-08 NOTE — Plan of Care (Signed)
Nurse discussed anxiety, depression and coping skills with patient.  

## 2022-06-08 NOTE — Group Note (Signed)
Recreation Therapy Group Note   Group Topic:Team Building  Group Date: 06/08/2022 Start Time: 0933 End Time: 1002 Facilitators: Shlomie Romig-McCall, LRT,CTRS Location: [address] Day[address]   Goal Area(s) Addresses:  Patient will effectively work with peer towards shared goal.  Patient will identify skills used to make activity successful.  Patient will share challenges and verbalize solution-driven approaches used. Patient will identify how skills used during activity can be used to reach post d/c goals.    Group Description: [employer]. Patients were provided the following materials: 5 drinking straws, 5 rubber bands, 5 paper clips, 2 index cards and 2 drinking cups. Using the provided materials patients were asked to build a launching mechanism to launch a ping pong ball across the room, approximately 10 feet. Patients were divided into teams of 3-5. Instructions required all materials be incorporated into the device, functionality of items left to the peer group's discretion.   Affect/Mood: N/A   Participation Level: Did not attend    Clinical Observations/Individualized Feedback:    Plan: Continue to engage patient in RT group sessions 2-3x/week.   Marian Grandt-McCall, LRT,CTRS 06/08/2022 12:56 PM

## 2022-06-08 NOTE — Progress Notes (Signed)
D:  Patient's self inventory sheet, patient sleeps good, sleep medication helpful.  Fair appetite, normal energy level, good concentration.  Denied depression and hopeless, rated anxiety 10.  Denied withdrawals.  Denied SI.  Physical problems, hearing voices.  Worst pain #5 in past 24 hours.  Pain medicine is helpful.  Goal is take medicines.  Plans to talk to MD.  No discharge plans. R:  Medications administered per MD orders.  Emotional support and encouragement given patient. R:   Denied SI and HI, contracts for safety.  Does hear voices and stated she has seen pink elephants this morning.

## 2022-06-08 NOTE — [facility] Group Notes (Signed)
Adult Psychoeducational Group Note  Date:  06/08/2022 Time:  9:34 PM  Group Topic/Focus:  Recovery Goals:   The focus of this group is to identify appropriate goals for recovery and establish a plan to achieve them.  Participation Level:  Active  Participation Quality:  Sharing  Affect:  Anxious  Cognitive:  Appropriate  Insight: Appropriate  Engagement in Group:  Engaged  Modes of Intervention:  Discussion  Additional Comments:    [name] 06/08/2022, 9:34 PM
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
## 2022-06-08 NOTE — Progress Notes (Signed)
Pt BP running low diastolic, did come up to 116/90.  Dr. Abbott Pao notified Pt began headbanging because "voices are telling me to" and Pt stated that she needed her zyprexa.  Dr. Abbott Pao stated it is okay to give the 5 mgt of Zyprexa.  Pt also received her melatonin and Dulera.
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
## 2022-06-08 NOTE — H&P (Addendum)
Psychiatric Admission Assessment Adult  Patient Identification: [name] MRN:  [id number] Date of Evaluation:  06/08/2022 Chief Complaint:  MDD (major depressive disorder), recurrent episode, severe (HCC) [F33.2] Principal Diagnosis: Schizoaffective disorder, bipolar type (HCC) Diagnosis:  Principal Problem:   Schizoaffective disorder, bipolar type (HCC) Active Problems:   PTSD (post-traumatic stress disorder)   Asthma   Fibromyalgia   Insomnia  CC:"I took all of my pills again."  Reason for admission: [name] is a 32 yo CF with past mental health diagnoses of PTSD, borderline personality d/o & schizoaffective d/o who was transported to the [facility] ED on 4/29 after she called 911 to report that she had taken an overdose of a weeks' worth of her medications. Pt reported on arrival to the ED that she took the following: 7 Trazodone, 7 Lexapro, 9 Vraylar, 7 Protonix, 7 flexeril, 25 Vistaril, 7 Zyprexa, 7 Prazosins, 14 Metoprolol in a suicide attempt." Pt was medically treated & stabilized prior to being transferred to this [facility] voluntarily for treatment and stabilization of her mental status.  Mode of transport to Hospital: Safe transport  Current Outpatient (Home) Medication List: Home meds are as listed above. PRN medication prior to evaluation:Hydroxyzine, Milk of Mag, Milk of Mag, Tylenol.   ED course: Treated and medically cleared Collateral Information:none  POA/Legal Guardian:Pt states that she is her own LG  History of Present Illness: Patient reports that the overdose this time was impulsive, denies that it was a suicide attempt, states that she was "trying to escape" after an argument with her ex boyfriend who also lives at her home. Pt states that this was the trigger; She reports that her ex BF brought in a box of [employer] that he had finished eating, and there were chicken bones in it, and instead of placing the box in the thrash, he placed it on the  floor, which she perceived as "very disrespectful of me." Pt reports that this started an argument, and she attempted to use coping mechanisms which were not effective, and she took the pills to escape that situation. Pt reports that she was already very upset that he still lives there, and has not moves like she asked him to. Pt states that she does not have the money to go pay the $80 required at the court house for the eviction process. She however admits to being suicidal for a couple of days prior to this overdose.  Pt reports that prior to the argument as noted above, she had other stressors; states that her favorite singer "[name]" died. Reports that her landlord gave her a timeline by which to complete repairs at her apartment. Reports that another child who lives with her child and child's grandmother recently had DSS open up a case on her, and she is concerned that her daughter will also be "looked at since she had a DSS case in the past." Pt states that the above were bothering her prior to the argument with her BF.  Pt reports that since the last discharge from [facility], her mood has fluctuated from being depressed for 2-3 days to being happy, then back to being depressed. She reports that sleep has been good on Zyprexa, melatonin and Trazodone. 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 She reports appetite as  good, concentration as varying, denies feelings of worthlessness or helplessness or hopelessness. Pt reports feelings of anxiety, frustration, irritability which worsened for a week prior to this admission.  Pt reports +AVH during her stay at the [facility] hospital. She states that the psychosis was not present until hospitalization, and states it was controlled with her medications. She relates return  of AVH as being due to not being on Vraylar and Zyprexa due to being taken off these meds while at the hospital r/t the overdose. Pt reports that she was hearing chatters and seeing pink elephants while at [facility].   Past Psychiatric Hx: Previous Psych Diagnoses: Schizoaffective disorder bipolar type, Anxiety, Autism spectrum disorder, PTSD, Borderline personality disorder  Prior inpatient treatment: Multiple. This is the 8th [facility] admission within a 12 month period. Outpatient treatment: has ACT team   Prior rehab hx: Denies History of suicide: multiple attempts mostly via overdose on her prescription medications. Most is this admission. History of homicide: Denies Psychiatric medication history: Previously trialed Tegretol, Haldol, Clozaril, Depakote, Invega, and Lamictal  Psychiatric medication compliance history: Poor     Substance Abuse Hx: Alcohol: Reports occasional drinks every month, states that she drinks 1-2 glasses of wine when she drinks ETOH. Non most recently as per patient Tobacco: Denies Illicit drugs: Denies Rx drug abuse: History of overdose   Past Medical History: Asthma     Current home medications:  Protonix 40 mg Corlanor 5 mg BID -Albuterol Inhaler PRN -Hydroxyzine  Metoprolol 25 mg daily  Propranolol 10 mg twice daily Flexeril 10 mg nightly Vryalar 3 mg once daily  Prazosin 1 mg nightly Voltaren gel daily as needed Symbicort inhaler Advair inhaler Lexapro 10 mg daily Oral iron every other day   Prior Hosp: Multiple for suicide attempts Prior Surgeries/Trauma (Head trauma, LOC, concussions, seizures): History of seizures, with last December 2017.   Social Hx: Pt reports that she lives with her ex BF whom she is trying to evict, and has a child who currently lives with her paternal grandmother. She reports that she volunteers at [facility] and works at [employer]. Reports that she has a "CST" team who are supportive to her. Reports that her husband completed  suicide, denies any other MH problems in her family.  Current Presentation: Pt with a depressed mood, affect is congruent. Her attention to personal hygiene and grooming is fair, eye contact is good, speech is clear & coherent. Thought contents are organized and logical, and pt currently denies SI/HI. She endorses AVH; reports hearing chatters and seeing pink elephants, which she last saw at the [facility] prior to being transferred to [facility]. She denies paranoia, denies delusional thoughts. Observed to have superficial scratch to left forearm and states she inflicted 3 weeks ago.    Associated Signs/Symptoms: Depression Symptoms:  depressed mood, suicidal attempt, anxiety, panic attacks, (Hypo) Manic Symptoms:  Hallucinations, Impulsivity, Anxiety Symptoms:  Excessive Worry, Panic Symptoms, Psychotic Symptoms:  Hallucinations: Auditory PTSD Symptoms: Had a traumatic exposure:  h/o trauma  Total Time spent with patient: 1.5 hours  Is the patient at risk to self? Yes.    Has the patient been a risk to self in the past 6 months? Yes.    Has the patient been a risk to self within the distant past? Yes.    Is the patient a risk to others? No.  Has the patient been a risk to others in the past 6 months? No.  Has the patient been a risk to others within  the distant past? No.   [place] Scale:  Flowsheet Row [facility]) [facility] Hosp-Admission (Discharged) from 06/04/2022 in [facility] ED from 06/02/2022 in [facility] Emergency Department at [facility]  C-SSRS RISK CATEGORY Moderate Risk High Risk No Risk     Alcohol Screening: 1. How often do you have a drink containing alcohol?: Never 2. How many drinks containing alcohol do you have on a typical day when you are drinking?: 1 or 2 3. How often do you have six or more drinks on one occasion?: Never AUDIT-C Score: 0 4. How often  during the last year have you found that you were not able to stop drinking once you had started?: Never 5. How often during the last year have you failed to do what was normally expected from you because of drinking?: Never 6. How often during the last year have you needed a first drink in the morning to get yourself going after a heavy drinking session?: Never 7. How often during the last year have you had a feeling of guilt of remorse after drinking?: Never 8. How often during the last year have you been unable to remember what happened the night before because you had been drinking?: Never 9. Have you or someone else been injured as a result of your drinking?: No 10. Has a relative or friend or a doctor or another health worker been concerned about your drinking or suggested you cut down?: No Alcohol Use Disorder Identification Test Final Score (AUDIT): 0 Alcohol Brief Interventions/Follow-up: Patient Refused Substance Abuse History in the last 12 months:  Yes.   Consequences of Substance Abuse: NA Previous Psychotropic Medications: Yes  Psychological Evaluations: No  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 fragile X syndrome    Chronic constipation    Depression    Drug-seeking behavior    Essential tremor    Headache    Overdose of acetaminophen 07/2017   and other meds   Personality disorder (HCC)    Schizo-affective psychosis (HCC)    Schizoaffective disorder, bipolar type (HCC)    Seizures (HCC)    Last seizure December 2017   Sleep apnea     Past Surgical History:  Procedure Laterality Date   MOUTH SURGERY  2009 or 2010   Family History:  Family History  Problem Relation Age of Onset   Mental illness Father    Asthma Father    PDD Brother    Seizures Brother    Family Psychiatric  History: See above Tobacco Screening:  Social History   Tobacco  Use  Smoking Status Former   Packs/day: 0   Types: Cigarettes  Smokeless Tobacco Never  Tobacco Comments   Smoked for 2  years age [age]    BH Tobacco Counseling     Are you interested in Tobacco Cessation Medications?  N/A, patient does not use tobacco products Counseled patient on smoking cessation:  N/A, patient does not use tobacco products Reason Tobacco Screening Not Completed: No value filed.       Social History:  Social History   Substance and Sexual Activity  Alcohol Use No   Alcohol/week: 1.0 standard drink of alcohol   Types: 1 Standard drinks or equivalent  per week   Comment: denies at this time     Social History   Substance and Sexual Activity  Drug Use No   Comment: History of cocaine use at age 67 for 4 months    Additional Social History: Marital status: Widowed Widowed, when?: 2018. Long term relationship, how long?: None. What types of issues is patient dealing with in the relationship?: None Are you sexually active?: No What is your sexual orientation?: "straight" Has your sexual activity been affected by drugs, alcohol, medication, or emotional stress?: "No" Does patient have children?: Yes How many children?: 1 How is patient's relationship with their children?: Pt reports she unknowingly signed away custody of her daughter      Allergies:   Allergies  Allergen Reactions   Bee Venom Anaphylaxis   Coconut Flavor Anaphylaxis and Rash   Fish Allergy Anaphylaxis   Geodon [Ziprasidone Hcl] Other (See Comments)    Pt states that this medication causes paralysis of the mouth.     Haloperidol And Related Other (See Comments)    Pt states that this medication causes paralysis of the mouth, jaw locks up   Lithobid [Lithium] Other (See Comments)    Seizure-like activity    Roxicodone [Oxycodone] Other (See Comments)    Hallucinations    Seroquel [Quetiapine] Other (See Comments)    Severe drowsiness Pt currently taking 12.5 mg BID, she is ok  with this dose   Shellfish Allergy Anaphylaxis   Phenergan [Promethazine Hcl] Other (See Comments)    Chest pain     Prilosec [Omeprazole] Nausea And Vomiting    Pt can take protonix with no problems    Sulfa Antibiotics Other (See Comments)    Chest pain    Tegretol [Carbamazepine] Nausea And Vomiting   Prozac [Fluoxetine] Other (See Comments)    Increased Depression Suicidal thoughts   Tape Other (See Comments)    Skin tears, can only tolerate paper tape.   Tylenol [Acetaminophen] Rash and Other (See Comments)    Rash on face    Lab Results: No results found for this or any previous visit (from the past 48 hour(s)).  Blood Alcohol level:  Lab Results  Component Value Date   ETH <10 06/04/2022   ETH <10 05/25/2022    Metabolic Disorder Labs:  Lab Results  Component Value Date   HGBA1C 5.2 04/20/2022   MPG 103 04/20/2022   MPG 96.8 10/07/2021   Lab Results  Component Value Date   PROLACTIN 66.2 (H) 03/02/2021   PROLACTIN 66.1 (H) 02/04/2020   Lab Results  Component Value Date   CHOL 191 04/20/2022   TRIG 163 (H) 04/20/2022   HDL 52 04/20/2022   CHOLHDL 3.7 04/20/2022   VLDL 33 04/20/2022   LDLCALC 106 (H) 04/20/2022   LDLCALC 102 (H) 12/10/2021    Current Medications: Current Facility-Administered Medications  Medication Dose Route Frequency Provider Last Rate Last Admin   albuterol (VENTOLIN HFA) 108 (90 Base) MCG/ACT inhaler 2 puff  2 puff Inhalation Q4H PRN Jandel Patriarca, NP       alum & mag hydroxide-simeth (MAALOX/MYLANTA) 200-200-20 MG/5ML suspension 30 mL  30 mL Oral Q4H PRN Starkes-Perry, Juel Burrow, FNP       [START ON 06/10/2022] cariprazine (VRAYLAR) capsule 1.5 mg  1.5 mg Oral Daily Massengill, Nathan, MD       cyclobenzaprine (FLEXERIL) tablet 10 mg  10 mg Oral QHS Starkes-Perry, Juel Burrow, FNP       escitalopram (LEXAPRO) tablet 10 mg  10 mg Oral Daily Maryagnes Amos, FNP   10 mg at 06/08/22 0900   hydrOXYzine (ATARAX) tablet 25 mg  25 mg  Oral TID PRN Phineas Inches, MD   25 mg at 06/08/22 1334   ibuprofen (ADVIL) tablet 400 mg  400 mg Oral Q6H PRN Maryagnes Amos, FNP   400 mg at 06/08/22 1204   ivabradine (CORLANOR) tablet 5 mg  5 mg Oral BID WC Grethel Zenk, NP       lidocaine (LIDODERM) 5 % 1 patch  1 patch Transdermal Q24H Starkes-Perry, Juel Burrow, FNP       LORazepam (ATIVAN) injection 2 mg  2 mg Intramuscular TID PRN Starkes-Perry, Juel Burrow, FNP       magnesium hydroxide (MILK OF MAGNESIA) suspension 30 mL  30 mL Oral Daily PRN Maryagnes Amos, FNP   30 mL at 06/08/22 1630   melatonin tablet 3 mg  3 mg Oral QHS Massengill, Nathan, MD       mometasone-formoterol (DULERA) 200-5 MCG/ACT inhaler 2 puff  2 puff Inhalation BID Starkes-Perry, Juel Burrow, FNP       neomycin-bacitracin-polymyxin (NEOSPORIN) ointment   Topical PRN Phineas Inches, MD   Given at 06/08/22 1338   neomycin-bacitracin-polymyxin (NEOSPORIN) ointment   Topical BID Starleen Blue, NP       OLANZapine zydis (ZYPREXA) disintegrating tablet 5 mg  5 mg Oral TID PRN Phineas Inches, MD       Or   OLANZapine (ZYPREXA) injection 5 mg  5 mg Intramuscular TID PRN Massengill, Harrold Donath, MD       pantoprazole (PROTONIX) EC tablet 40 mg  40 mg Oral Daily Maryagnes Amos, FNP   40 mg at 06/08/22 0900   prazosin (MINIPRESS) capsule 1 mg  1 mg Oral QHS Starkes-Perry, Juel Burrow, FNP       pregabalin (LYRICA) capsule 100 mg  100 mg Oral TID Maryagnes Amos, FNP   100 mg at 06/08/22 1628   traZODone (DESYREL) tablet 50 mg  50 mg Oral QHS PRN Massengill, Harrold Donath, MD       PTA Medications: Medications Prior to Admission  Medication Sig Dispense Refill Last Dose   albuterol (VENTOLIN HFA) 108 (90 Base) MCG/ACT inhaler Inhale 2 puffs into the lungs every 6 (six) hours as needed for wheezing or shortness of breath.      BREYNA 160-4.5 MCG/ACT inhaler Inhale 1 puff into the lungs in the morning and at bedtime.      CORLANOR 5 MG TABS tablet Take 5 mg  by mouth 2 (two) times daily.      Cyanocobalamin (VITAMIN B-12 PO) Take 1 capsule by mouth in the morning.      cyclobenzaprine (FLEXERIL) 10 MG tablet Take 10 mg by mouth at bedtime.      escitalopram (LEXAPRO) 10 MG tablet Take 1 tablet (10 mg total) by mouth daily for 14 days. 14 tablet 0    hydrOXYzine (ATARAX) 25 MG tablet Take 25 mg twice daily by mouth as needed for anxiety. Take 100 mg by mouth for sleep as needed at bedtime. (Patient taking differently: Take 25 mg by mouth 3 (three) times daily as needed for anxiety.) 7 tablet 0    ibuprofen (ADVIL) 200 MG tablet Take 400 mg by mouth every 6 (six) hours as needed for mild pain or headache.      lidocaine (HM LIDOCAINE PATCH) 4 % Place 1 patch onto the skin daily. Apply to the affected area (Patient not taking:  Reported on 06/06/2022) 5 patch 0    Melatonin 3 MG CAPS Take 3 mg by mouth at bedtime.      metoprolol succinate (TOPROL-XL) 25 MG 24 hr tablet Take 1 tablet (25 mg total) by mouth daily. (Patient not taking: Reported on 05/19/2022) 15 tablet 0    OLANZapine (ZYPREXA) 5 MG tablet Take 1 tablet (5 mg total) by mouth daily as needed for up to 7 days (anxiety). (Patient taking differently: Take 5 mg by mouth at bedtime.) 7 tablet 0    pantoprazole (PROTONIX) 40 MG tablet Take 1 tablet (40 mg total) by mouth daily for 14 days. (Patient taking differently: Take 40 mg by mouth daily before breakfast.) 14 tablet 0    prazosin (MINIPRESS) 1 MG capsule Take 1 capsule (1 mg total) by mouth at bedtime for 14 days. 14 capsule 0    pregabalin (LYRICA) 100 MG capsule Take 100 mg by mouth 3 (three) times daily.      traZODone (DESYREL) 50 MG tablet Take 1 tablet (50 mg total) by mouth at bedtime as needed for up to 7 days for sleep. (Patient taking differently: Take 50 mg by mouth at bedtime.) 7 tablet 0    VRAYLAR 4.5 MG CAPS Take 4.5 mg by mouth in the morning.      Musculoskeletal: Strength & Muscle Tone: within normal limits Gait & Station:  normal Patient leans: N/A  Psychiatric Specialty Exam:  Presentation  General Appearance:  Fairly Groomed  Eye Contact: Good  Speech: Clear and Coherent  Speech Volume: Normal  Handedness: Right   Mood and Affect  Mood: Depressed  Affect: Congruent   Thought Process  Thought Processes: Coherent  Duration of Psychotic Symptoms: >1 yr Past Diagnosis of Schizophrenia or Psychoactive disorder: Yes  Descriptions of Associations:Intact  Orientation:Full (Time, Place and Person)  Thought Content:WDL  Hallucinations:Hallucinations: Auditory  Ideas of Reference:None  Suicidal Thoughts:Suicidal Thoughts: No  Homicidal Thoughts:Homicidal Thoughts: No   Sensorium  Memory: Immediate Good  Judgment: Fair  Insight: Fair   Art therapist  Concentration: Good  Attention Span: Fair  Recall: Fair  Fund of Knowledge: Fair  Language: Fair   Psychomotor Activity  Psychomotor Activity:Psychomotor Activity: Normal   Assets  Assets: Resilience   Sleep  Sleep:Sleep: Good    Physical Exam: Physical Exam Constitutional:      Appearance: Normal appearance.  HENT:     Nose: Nose normal. No congestion.  Eyes:     Pupils: Pupils are equal, round, and reactive to light.  Musculoskeletal:        General: Normal range of motion.     Cervical back: Normal range of motion.  Neurological:     Mental Status: She is alert and oriented to person, place, and time.    Review of Systems  Constitutional:  Negative for fever.  HENT:  Negative for hearing loss.   Eyes:  Negative for blurred vision.  Respiratory:  Negative for cough.   Cardiovascular:  Negative for chest pain.  Gastrointestinal:  Negative for heartburn.  Genitourinary:  Negative for dysuria.  Musculoskeletal:  Negative for myalgias.  Skin:  Negative for rash.  Neurological:  Negative for dizziness.  Psychiatric/Behavioral:  Positive for depression and hallucinations.  Negative for memory loss, substance abuse and suicidal ideas. The patient is nervous/anxious and has insomnia.    Blood pressure 135/82, pulse (!) 134, temperature 98.4 F (36.9 C), temperature source Oral, resp. rate 18, height 5\' 5"  (1.651 m), weight 94.3 kg, last menstrual  period 04/25/2022, SpO2 98 %. Body mass index is 34.61 kg/m. RN asked to recheck HR.  Treatment Plan Summary: Daily contact with patient to assess and evaluate symptoms and progress in treatment and Medication management  Observation Level/Precautions:  15 minute checks  Laboratory:  Labs reviewed   Psychotherapy:  Unit Group sessions  Medications:  See [facility]  Consultations:  To be determined   Discharge Concerns:  Safety, medication compliance, mood stability  Estimated LOS: 5-7 days  Other:  N/A   PLAN Safety and Monitoring: Voluntary admission to inpatient psychiatric unit for safety, stabilization and treatment Daily contact with patient to assess and evaluate symptoms and progress in treatment Patient's case to be discussed in multi-disciplinary team meeting Observation Level : q15 minute checks Vital signs: q12 hours Precautions: Safety  Long Term Goal(s): Improvement in symptoms so as ready for discharge  Short Term Goals: Ability to identify changes in lifestyle to reduce recurrence of condition will improve, Ability to verbalize feelings will improve, Ability to disclose and discuss suicidal ideas, Ability to demonstrate self-control will improve, Ability to identify and develop effective coping behaviors will improve, Ability to maintain clinical measurements within normal limits will improve, and Compliance with prescribed medications will improve  Diagnoses:  Principal Problem:   Schizoaffective disorder, bipolar type (HCC) Active Problems:   PTSD (post-traumatic stress disorder)   Asthma   Fibromyalgia   Insomnia  Medications -Start Vraylar 1.5 mg on 5/5 (delay d/t long half life)-Od'd on  med -Continue Lexapro 10 mg daily for depressive symptoms -Start Hydroxyzine 25 mg TID PRN for anxiety -Stop Scheduled Zyprexa -Start Trazodone 50 prn nightly for sleep -Start Melatonin 3 mg nightly for sleep -Stop Toprol 25 mg daily for tachycardia -Start Corlanor 5 mg tabs for tachycardia BID (home medication) -Continue Prazosin 1 mg nightly for nightmares -Continue Lyrica 100mg  TID for pain -Continue Dulera inhaler for asthma -Start albuterol inhaler for asthma prn -Start Neosporin BID to left arm cut  Other PRNS -Continue Tylenol 650 mg every 6 hours PRN for mild pain -Continue Maalox 30 mg every 4 hrs PRN for indigestion -Continue Milk of Magnesia as needed every 6 hrs for constipation  Discharge Planning: Social work and case management to assist with discharge planning and identification of hospital follow-up needs prior to discharge Estimated LOS: 5-7 days Discharge Concerns: Need to establish a safety plan; Medication compliance and effectiveness Discharge Goals: Return home with outpatient referrals for mental health follow-up including medication management/psychotherapy  I certify that inpatient services furnished can reasonably be expected to improve the patient's condition.    Starleen Blue, NP 5/3/20244:52 PM

## 2022-06-08 NOTE — BH IP Treatment Plan (Signed)
Interdisciplinary Treatment and Diagnostic Plan   06/08/2022 Time of Session: [address] [name] MRN: [id number]  Principal Diagnosis: <principal problem not specified>  Secondary Diagnoses: Active Problems:   MDD (major depressive disorder), recurrent episode, severe (HCC)   Current Medications:  Current Facility-Administered Medications  Medication Dose Route Frequency Provider Last Rate Last Admin   alum & mag hydroxide-simeth (MAALOX/MYLANTA) 200-200-20 MG/5ML suspension 30 mL  30 mL Oral Q4H PRN Starkes-Perry, Juel Burrow, FNP       [START ON 06/10/2022] cariprazine (VRAYLAR) capsule 1.5 mg  1.5 mg Oral Daily Massengill, Harrold Donath, MD       cyclobenzaprine (FLEXERIL) tablet 10 mg  10 mg Oral QHS Starkes-Perry, Juel Burrow, FNP       escitalopram (LEXAPRO) tablet 10 mg  10 mg Oral Daily Maryagnes Amos, FNP   10 mg at 06/08/22 0900   hydrOXYzine (ATARAX) tablet 25 mg  25 mg Oral TID PRN Phineas Inches, MD   25 mg at 06/08/22 1334   ibuprofen (ADVIL) tablet 400 mg  400 mg Oral Q6H PRN Maryagnes Amos, FNP   400 mg at 06/08/22 1204   lidocaine (LIDODERM) 5 % 1 patch  1 patch Transdermal Q24H Starkes-Perry, Juel Burrow, FNP       LORazepam (ATIVAN) injection 2 mg  2 mg Intramuscular TID PRN Starkes-Perry, Juel Burrow, FNP       magnesium hydroxide (MILK OF MAGNESIA) suspension 30 mL  30 mL Oral Daily PRN Starkes-Perry, Juel Burrow, FNP       melatonin tablet 3 mg  3 mg Oral QHS Massengill, Nathan, MD       metoprolol succinate (TOPROL-XL) 24 hr tablet 25 mg  25 mg Oral Daily Starkes-Perry, Takia S, FNP   25 mg at 06/08/22 0900   mometasone-formoterol (DULERA) 200-5 MCG/ACT inhaler 2 puff  2 puff Inhalation BID Starkes-Perry, Juel Burrow, FNP       neomycin-bacitracin-polymyxin (NEOSPORIN) ointment   Topical PRN Phineas Inches, MD   Given at 06/08/22 1338   OLANZapine zydis (ZYPREXA) disintegrating tablet 5 mg  5 mg Oral TID PRN Phineas Inches, MD       Or   OLANZapine (ZYPREXA)  injection 5 mg  5 mg Intramuscular TID PRN Massengill, Harrold Donath, MD       pantoprazole (PROTONIX) EC tablet 40 mg  40 mg Oral Daily Maryagnes Amos, FNP   40 mg at 06/08/22 0900   prazosin (MINIPRESS) capsule 1 mg  1 mg Oral QHS Starkes-Perry, Juel Burrow, FNP       pregabalin (LYRICA) capsule 100 mg  100 mg Oral TID Maryagnes Amos, FNP   100 mg at 06/08/22 1204   traZODone (DESYREL) tablet 50 mg  50 mg Oral QHS PRN Massengill, Harrold Donath, MD       PTA Medications: Medications Prior to Admission  Medication Sig Dispense Refill Last Dose   albuterol (VENTOLIN HFA) 108 (90 Base) MCG/ACT inhaler Inhale 2 puffs into the lungs every 6 (six) hours as needed for wheezing or shortness of breath.      BREYNA 160-4.5 MCG/ACT inhaler Inhale 1 puff into the lungs in the morning and at bedtime.      CORLANOR 5 MG TABS tablet Take 5 mg by mouth 2 (two) times daily.      Cyanocobalamin (VITAMIN B-12 PO) Take 1 capsule by mouth in the morning.      cyclobenzaprine (FLEXERIL) 10 MG tablet Take 10 mg by mouth at bedtime.  escitalopram (LEXAPRO) 10 MG tablet Take 1 tablet (10 mg total) by mouth daily for 14 days. 14 tablet 0    hydrOXYzine (ATARAX) 25 MG tablet Take 25 mg twice daily by mouth as needed for anxiety. Take 100 mg by mouth for sleep as needed at bedtime. (Patient taking differently: Take 25 mg by mouth 3 (three) times daily as needed for anxiety.) 7 tablet 0    ibuprofen (ADVIL) 200 MG tablet Take 400 mg by mouth every 6 (six) hours as needed for mild pain or headache.      lidocaine (HM LIDOCAINE PATCH) 4 % Place 1 patch onto the skin daily. Apply to the affected area (Patient not taking: Reported on 06/06/2022) 5 patch 0    Melatonin 3 MG CAPS Take 3 mg by mouth at bedtime.      metoprolol succinate (TOPROL-XL) 25 MG 24 hr tablet Take 1 tablet (25 mg total) by mouth daily. (Patient not taking: Reported on 05/19/2022) 15 tablet 0    OLANZapine (ZYPREXA) 5 MG tablet Take 1 tablet (5 mg total) by  mouth daily as needed for up to 7 days (anxiety). (Patient taking differently: Take 5 mg by mouth at bedtime.) 7 tablet 0    pantoprazole (PROTONIX) 40 MG tablet Take 1 tablet (40 mg total) by mouth daily for 14 days. (Patient taking differently: Take 40 mg by mouth daily before breakfast.) 14 tablet 0    prazosin (MINIPRESS) 1 MG capsule Take 1 capsule (1 mg total) by mouth at bedtime for 14 days. 14 capsule 0    pregabalin (LYRICA) 100 MG capsule Take 100 mg by mouth 3 (three) times daily.      traZODone (DESYREL) 50 MG tablet Take 1 tablet (50 mg total) by mouth at bedtime as needed for up to 7 days for sleep. (Patient taking differently: Take 50 mg by mouth at bedtime.) 7 tablet 0    VRAYLAR 4.5 MG CAPS Take 4.5 mg by mouth in the morning.       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   Physician Treatment Plan for Primary Diagnosis: <principal problem not specified> Long Term Goal(s):     Short Term Goals:    Medication Management: Evaluate patient's response, side effects, and tolerance of medication regimen.  Therapeutic Interventions: 1 to 1 sessions, Unit Group sessions and Medication administration.  Evaluation of Outcomes: Progressing  Physician Treatment Plan for Secondary Diagnosis: Active Problems:   MDD (major depressive disorder), recurrent episode, severe (HCC)  Long Term Goal(s):     Short Term Goals:       Medication Management: Evaluate patient's response, side effects, and tolerance of medication regimen.  Therapeutic Interventions: 1 to 1 sessions, Unit Group sessions and Medication administration.  Evaluation of Outcomes: Progressing   RN Treatment Plan for Primary Diagnosis: <principal  problem not specified> Long Term Goal(s): Knowledge of disease and therapeutic regimen to maintain health will improve  Short Term Goals: Ability to remain free from injury will improve, Ability to verbalize frustration and anger appropriately will improve, Ability to demonstrate self-control, Ability to participate in decision making will improve, Ability to verbalize feelings will improve, Ability to disclose and discuss suicidal ideas, Ability to identify and develop effective coping behaviors will improve, and Compliance with prescribed  medications will improve  Medication Management: RN will administer medications as ordered by provider, will assess and evaluate patient's response and provide education to patient for prescribed medication. RN will report any adverse and/or side effects to prescribing provider.  Therapeutic Interventions: 1 on 1 counseling sessions, Psychoeducation, Medication administration, Evaluate responses to treatment, Monitor vital signs and CBGs as ordered, Perform/monitor CIWA, COWS, AIMS and Fall Risk screenings as ordered, Perform wound care treatments as ordered.  Evaluation of Outcomes: Progressing   LCSW Treatment Plan for Primary Diagnosis: <principal problem not specified> Long Term Goal(s): Safe transition to appropriate next level of care at discharge, Engage patient in therapeutic group addressing interpersonal concerns.  Short Term Goals: Engage patient in aftercare planning with referrals and resources, Increase social support, Increase ability to appropriately verbalize feelings, Increase emotional regulation, Facilitate acceptance of mental health diagnosis and concerns, Facilitate patient progression through stages of change regarding substance use diagnoses and concerns, Identify triggers associated with mental health/substance abuse issues, and Increase skills for wellness and recovery  Therapeutic Interventions: Assess for all discharge needs, 1 to 1  time with Social worker, Explore available resources and support systems, Assess for adequacy in community support network, Educate family and significant other(s) on suicide prevention, Complete Psychosocial Assessment, Interpersonal group therapy.  Evaluation of Outcomes: Progressing   Progress in Treatment: Attending groups: Yes. Participating in groups: Yes. Taking medication as prescribed: Yes. Toleration medication: Yes. Family/Significant other contact made: Yes, individual(s) contacted:  CST team member Patient understands diagnosis: Yes. Discussing patient identified problems/goals with staff: Yes. Medical problems stabilized or resolved: Yes. Denies suicidal/homicidal ideation: Yes. Issues/concerns per patient self-inventory: Yes. Other: N/A  New problem(s) identified: No, Describe:  None Reported  New Short Term/Long Term Goal(s):medication stabilization, elimination of SI thoughts, development of comprehensive mental wellness plan.    Patient Goals:  Medication Management  Discharge Plan or Barriers: : Patient recently admitted. CSW will continue to follow and assess for appropriate referrals and possible discharge planning.    Reason for Continuation of Hospitalization: Anxiety Depression Medication stabilization Suicidal ideation  Estimated Length of Stay: 3-7 Days  Last 3 [place] Suicide Severity Risk Score: Flowsheet Row Ad[facility] (Current) [facility]-Admission (Discharged) from 06/04/2022 in [facility] from 06/02/2022 in [facility] Department [facility]  C-SSRS RISK CATEGORY Moderate Risk High Risk No Risk       Last [facility] 2/9 Scores:    03/10/2022   10:01 PM 03/10/2022    9:56 PM 02/10/2022    1:49 AM  Depression screen PHQ 2/9  Decreased Interest 1 1 2   Down, Depressed, Hopeless 1 1 2   PHQ - 2 Score 2 2 4   Altered sleeping 1 1 2    Tired, decreased energy 1 1 2   Change in appetite 1 0 2  Feeling bad or failure about yourself  1 1 2   Trouble concentrating 1 1 2   Moving slowly or fidgety/restless 1 1 1   Suicidal thoughts 1 1 1   PHQ-9 Score 9 8 16   Difficult doing work/chores Somewhat difficult  Very difficult    medication stabilization, elimination of SI thoughts, development of comprehensive mental wellness plan.   Scribe for Treatment Team: Ane Payment, LCSW 06/08/2022 2:50 PM

## 2022-06-08 NOTE — Progress Notes (Signed)
[name] is a 32 y.o. female voluntarily admitted for suicide attempt  via overdose on her home medications. Pt has been to the ED 16th times this year with complaints of intentional overdose. Pt stated she is having issues with the landlord, with her boyfriend not helping keep the house clean, and her 39 year old daughter facing to be put into foster home. Pt presented alert and oriented during admission, denied SI but reported auditory hallucinations. Pt also complained of left shoulder pain 6/10. Pt given advil 400 mg PO before going to bed.  Consents signed, skin/belongings search completed and pt oriented to unit. Pt stable at this time. Pt given the opportunity to express concerns and ask questions. Pt given toiletries. Will continue to monitor.

## 2022-06-08 NOTE — Tx Team (Signed)
Initial Treatment Plan 06/08/2022 1:06 AM [name] [id number]:[id number]    PATIENT STRESSORS: Financial difficulties   Marital or family conflict     PATIENT STRENGTHS: Capable of independent living  Communication skills  Work skills    PATIENT IDENTIFIED PROBLEMS: Suicide attempt  Anxiety  " Have my daughter back"                 DISCHARGE CRITERIA:  Ability to meet basic life and health needs Improved stabilization in mood, thinking, and/or behavior Medical problems require only outpatient monitoring Verbal commitment to aftercare and medication compliance  PRELIMINARY DISCHARGE PLAN: Attend aftercare/continuing care group Attend PHP/IOP Return to previous living arrangement Return to previous work or school arrangements  PATIENT/FAMILY INVOLVEMENT: This treatment plan has been presented to and reviewed with the patient, [name], and/or family member, .  The patient and family have been given the opportunity to ask questions and make suggestions.  Bethann Punches, RN 06/08/2022, 1:06 AM

## 2022-06-09 MED ORDER — MAGNESIUM CITRATE PO SOLN
1.0000 | Freq: Once | ORAL | Status: DC
  Filled 2022-06-09: qty 296

## 2022-06-09 NOTE — Progress Notes (Signed)
[facility] MD Progress Note  06/09/2022 3:02 PM [name]  MRN:  [id number] Principal Problem: Schizoaffective disorder, bipolar type [facility]) Diagnosis: Principal Problem:   Schizoaffective disorder, bipolar type (HCC) Active Problems:   PTSD (post-traumatic stress disorder)   Asthma   Fibromyalgia   Insomnia  Reason for admission: [name]. Mi is a 32 yo CF with past mental health diagnoses of PTSD, borderline personality d/o & schizoaffective d/o who was transported to the [facility] ED on 4/29 after she called 911 to report that she had taken an overdose of a weeks' worth of her medications. Pt reported on arrival to the ED that she took the following: 7 Trazodone, 7 Lexapro, 9 Vraylar, 7 Protonix, 7 flexeril, 25 Vistaril, 7 Zyprexa, 7 Prazosins, 14 Metoprolol in a suicide attempt." Pt was medically treated & stabilized prior to being transferred to this [facility] voluntarily for treatment and stabilization of her mental status.   24 hr chart review: Sleep Hours last night: "Good" as per patient  Nursing Concerns: Reported "AVH of voices" telling her to bang her head last night. Behavioral episodes in the past 24 hrs: Attempted to bang head and seeking Zyprexa Medication Compliance: Compliant  Vital Signs in the past 24 hrs: WNL PRN Medications in the past 24 hrs: Zyprexa 5 mg , Ibuprofen, Hydroxyzine.   Patient assessment note:  Pt with flat affect and depressed mood, attention to personal hygiene and grooming is fair, eye contact is good, speech is clear & coherent. Thought contents are organized and logical, and pt currently denies SI/HI/AVH or paranoia. There is no evidence of delusional thoughts.    Patient reports that her sleep quality last night was good, reports a good appetite, reports that, she is  tolerating medications with no medications related side effects. Reported no BM x 6 days, Mg Citrate ordered, but pt reported to RN that she had a bowel movement prior to medication  being offered. She signed a 72 hr notice for discharge earlier today morning at 0605 as per assigned RN. Initial plan was to discharge pt on Tuesday, 5/7. We will consider discharging her on Monday 5/6 if she does not retract her notice for discharge. We are continuing medications as listed below with no changes being made today.   Total Time spent with patient: 45 minutes  Past Psychiatric History: See H & P  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 fragile X syndrome    Chronic constipation    Depression    Drug-seeking behavior    Essential tremor    Headache    Overdose of acetaminophen 07/2017   and other meds   Personality disorder (HCC)    Schizo-affective psychosis (HCC)    Schizoaffective disorder, bipolar type (HCC)    Seizures (HCC)    Last seizure December 2017   Sleep apnea     Past Surgical History:  Procedure Laterality Date   MOUTH SURGERY  2009 or 2010   Family History:  Family History  Problem Relation Age of Onset   Mental illness Father    Asthma Father    PDD Brother    Seizures Brother    Family Psychiatric  History: See H & P Social History:  Social History   Substance and Sexual Activity  Alcohol Use No   Alcohol/week: 1.0  standard drink of alcohol   Types: 1 Standard drinks or equivalent per week   Comment: denies at this time     Social History   Substance and Sexual Activity  Drug Use No   Comment: History of cocaine use at age 52 for 4 months    Social History   Socioeconomic History   Marital status: Widowed    Spouse name: Not on file   Number of children: 0   Years of education: Not on file   Highest education level: Not on file  Occupational History   Occupation: disability  Tobacco Use   Smoking status: Former    Packs/day: 0    Types: Cigarettes   Smokeless tobacco: Never   Tobacco comments:    Smoked  for 2  years age [age]  Vaping Use   Vaping Use: Never used  Substance and Sexual Activity   Alcohol use: No    Alcohol/week: 1.0 standard drink of alcohol    Types: 1 Standard drinks or equivalent per week    Comment: denies at this time   Drug use: No    Comment: History of cocaine use at age 29 for 4 months   Sexual activity: Not Currently    Birth control/protection: None  Other Topics Concern   Not on file  Social History Narrative   Marital status: Widowed      Children: daughter      Lives: with boyfriend, in two story home      Employment:  Disability      Tobacco: quit smoking; smoked for two years.      Alcohol ;none      Drugs: none   Has not traveled outside of the country.   Right handed         Social Determinants of Health   Financial Resource Strain: Not on file  Food Insecurity: No Food Insecurity (06/08/2022)   Hunger Vital Sign    Worried About Running Out of Food in the Last Year: Never true    Ran Out of Food in the Last Year: Never true  Transportation Needs: No Transportation Needs (06/08/2022)   PRAPARE - Administrator, Civil Service (Medical): No    Lack of Transportation (Non-Medical): No  Recent Concern: Transportation Needs - Unmet Transportation Needs (05/06/2022)   PRAPARE - Administrator, Civil Service (Medical): Yes    Lack of Transportation (Non-Medical): Yes  Physical Activity: Not on file  Stress: Not on file  Social Connections: Not on file  Sleep: Good  Appetite:  Good  Current Medications: Current Facility-Administered Medications  Medication Dose Route Frequency Provider Last Rate Last Admin   albuterol (VENTOLIN HFA) 108 (90 Base) MCG/ACT inhaler 2 puff  2 puff Inhalation Q4H PRN Daelen Belvedere, NP       alum & mag hydroxide-simeth (MAALOX/MYLANTA) 200-200-20 MG/5ML suspension 30 mL  30 mL Oral Q4H PRN Starkes-Perry, Juel Burrow, FNP       [START ON 06/10/2022] cariprazine (VRAYLAR) capsule 1.5 mg  1.5 mg Oral  Daily Massengill, Nathan, MD       cyclobenzaprine (FLEXERIL) tablet 10 mg  10 mg Oral QHS Starkes-Perry, Juel Burrow, FNP       diclofenac Sodium (VOLTAREN) 1 % topical gel 2 g  2 g Topical BID Jerime Arif, NP   2 g at 06/09/22 0742   escitalopram (LEXAPRO) tablet 10 mg  10 mg Oral Daily Maryagnes Amos, FNP   10 mg  at 06/09/22 0742   hydrOXYzine (ATARAX) tablet 25 mg  25 mg Oral TID PRN Phineas Inches, MD   25 mg at 06/09/22 1306   ibuprofen (ADVIL) tablet 400 mg  400 mg Oral Q6H PRN Maryagnes Amos, FNP   400 mg at 06/09/22 0444   ivabradine (CORLANOR) tablet 5 mg  5 mg Oral BID WC Verble Styron, NP   5 mg at 06/09/22 1000   lidocaine (LIDODERM) 5 % 1 patch  1 patch Transdermal Q24H Starkes-Perry, Juel Burrow, FNP       LORazepam (ATIVAN) injection 2 mg  2 mg Intramuscular TID PRN Starkes-Perry, Juel Burrow, FNP       magnesium citrate solution 1 Bottle  1 Bottle Oral Once Riley Hallum, NP       magnesium hydroxide (MILK OF MAGNESIA) suspension 30 mL  30 mL Oral Daily PRN Maryagnes Amos, FNP   30 mL at 06/08/22 1630   melatonin tablet 3 mg  3 mg Oral QHS Massengill, Harrold Donath, MD   3 mg at 06/08/22 2137   mometasone-formoterol (DULERA) 200-5 MCG/ACT inhaler 2 puff  2 puff Inhalation BID Maryagnes Amos, FNP   2 puff at 06/09/22 1610   neomycin-bacitracin-polymyxin (NEOSPORIN) ointment   Topical PRN Phineas Inches, MD   Given at 06/08/22 1338   neomycin-bacitracin-polymyxin (NEOSPORIN) ointment   Topical BID Starleen Blue, NP   1 Application at 06/09/22 0743   OLANZapine zydis (ZYPREXA) disintegrating tablet 5 mg  5 mg Oral TID PRN Phineas Inches, MD   5 mg at 06/08/22 2137   Or   OLANZapine (ZYPREXA) injection 5 mg  5 mg Intramuscular TID PRN Massengill, Harrold Donath, MD       pantoprazole (PROTONIX) EC tablet 40 mg  40 mg Oral Daily Maryagnes Amos, FNP   40 mg at 06/09/22 0744   prazosin (MINIPRESS) capsule 1 mg  1 mg Oral QHS Starkes-Perry, Juel Burrow, FNP        pregabalin (LYRICA) capsule 100 mg  100 mg Oral TID Maryagnes Amos, FNP   100 mg at 06/09/22 1119   traZODone (DESYREL) tablet 50 mg  50 mg Oral QHS PRN Massengill, Harrold Donath, MD        Lab Results: No results found for this or any previous visit (from the past 48 hour(s)).  Blood Alcohol level:  Lab Results  Component Value Date   ETH <10 06/04/2022   ETH <10 05/25/2022   Metabolic Disorder Labs: Lab Results  Component Value Date   HGBA1C 5.2 04/20/2022   MPG 103 04/20/2022   MPG 96.8 10/07/2021   Lab Results  Component Value Date   PROLACTIN 66.2 (H) 03/02/2021   PROLACTIN 66.1 (H) 02/04/2020   Lab Results  Component Value Date   CHOL 191 04/20/2022   TRIG 163 (H) 04/20/2022   HDL 52 04/20/2022   CHOLHDL 3.7 04/20/2022   VLDL 33 04/20/2022   LDLCALC 106 (H) 04/20/2022   LDLCALC 102 (H) 12/10/2021   Physical Findings: AIMS: 0 CIWA:    COWS:     Musculoskeletal: Strength & Muscle Tone: within normal limits Gait & Station: normal Patient leans: N/A  Psychiatric Specialty Exam:  Presentation  General Appearance:  Appropriate for Environment; Fairly Groomed  Eye Contact: Good  Speech: Clear and Coherent  Speech Volume: Normal  Handedness: Right   Mood and Affect  Mood: Depressed  Affect: Congruent   Thought Process  Thought Processes: Coherent  Descriptions of Associations:Intact  Orientation:Full (Time, Place  and Person)  Thought Content:WDL  History of Schizophrenia/Schizoaffective disorder:Yes  Duration of Psychotic Symptoms:Greater than six months  Hallucinations:Hallucinations: None  Ideas of Reference:None  Suicidal Thoughts:Suicidal Thoughts: No  Homicidal Thoughts:Homicidal Thoughts: No   Sensorium  Memory: Immediate Good  Judgment: Fair  Insight: Fair   Art therapist  Concentration: Fair  Attention Span: Fair  Recall: Fair  Fund of Knowledge: Fair  Language: Fair  Psychomotor  Activity  Psychomotor Activity: Psychomotor Activity: Normal  Assets  Assets: Communication Skills  Sleep  Sleep: Sleep: Good  Physical Exam: Physical Exam Constitutional:      Appearance: Normal appearance.  HENT:     Head: Normocephalic.  Eyes:     Pupils: Pupils are equal, round, and reactive to light.  Pulmonary:     Effort: Pulmonary effort is normal.  Musculoskeletal:        General: Normal range of motion.     Cervical back: Normal range of motion.  Neurological:     Mental Status: She is alert and oriented to person, place, and time.  Psychiatric:        Mood and Affect: Mood normal.        Behavior: Behavior normal.        Thought Content: Thought content normal.    Review of Systems  Constitutional:  Negative for fever.  HENT:  Negative for hearing loss.   Eyes:  Negative for blurred vision.  Respiratory:  Negative for cough.   Cardiovascular:  Negative for chest pain.  Gastrointestinal:  Negative for heartburn.  Genitourinary:  Negative for dysuria.  Musculoskeletal:  Negative for myalgias.  Skin:  Negative for rash.  Neurological:  Negative for dizziness.  Psychiatric/Behavioral:  Positive for depression. Negative for hallucinations, memory loss, substance abuse and suicidal ideas. The patient is nervous/anxious and has insomnia.    Blood pressure 110/62, pulse 100, temperature 97.7 F (36.5 C), temperature source Oral, resp. rate 18, height 5\' 5"  (1.651 m), weight 94.3 kg, last menstrual period 04/25/2022, SpO2 99 %. Body mass index is 34.61 kg/m.  Treatment Plan Summary: reatment Plan Summary: Daily contact with patient to assess and evaluate symptoms and progress in treatment and Medication management   Observation Level/Precautions:  15 minute checks  Laboratory:  Labs reviewed   Psychotherapy:  Unit Group sessions  Medications:  See [facility]  Consultations:  To be determined   Discharge Concerns:  Safety, medication compliance, mood stability   Estimated LOS: 5-7 days  Other:  N/A    PLAN Safety and Monitoring: Voluntary admission to inpatient psychiatric unit for safety, stabilization and treatment Daily contact with patient to assess and evaluate symptoms and progress in treatment Patient's case to be discussed in multi-disciplinary team meeting Observation Level : q15 minute checks Vital signs: q12 hours Precautions: Safety   Long Term Goal(s): Improvement in symptoms so as ready for discharge   Short Term Goals: Ability to identify changes in lifestyle to reduce recurrence of condition will improve, Ability to verbalize feelings will improve, Ability to disclose and discuss suicidal ideas, Ability to demonstrate self-control will improve, Ability to identify and develop effective coping behaviors will improve, Ability to maintain clinical measurements within normal limits will improve, and Compliance with prescribed medications will improve   Diagnoses:  Principal Problem:   Schizoaffective disorder, bipolar type (HCC) Active Problems:   PTSD (post-traumatic stress disorder)   Asthma   Fibromyalgia   Insomnia   Medications -Start Vraylar 1.5 mg on 5/5 (delay d/t long half life)-Od'd on  med -Continue Lexapro 10 mg daily for depressive symptoms -Continue Hydroxyzine 25 mg TID PRN for anxiety -Stopped Scheduled Zyprexa & ordered PRN dose on admission -Continue Trazodone 50 prn nightly for sleep -Continue Melatonin 3 mg nightly for sleep -Stopped Toprol 25 mg daily for tachycardia (pt states her cardiologist dosnt, want her taking med) -Continue Corlanor 5 mg tabs for tachycardia BID (home medication) -Continue Prazosin 1 mg nightly for nightmares -Continue Lyrica 100mg  TID for pain -Continue Dulera inhaler for asthma -Continue albuterol inhaler for asthma prn -Continue Neosporin BID to left arm cut   Other PRNS -Continue Tylenol 650 mg every 6 hours PRN for mild pain -Continue Maalox 30 mg every 4 hrs PRN for  indigestion -Continue Milk of Magnesia as needed every 6 hrs for constipation   Discharge Planning: Social work and case management to assist with discharge planning and identification of hospital follow-up needs prior to discharge Estimated LOS: 5-7 days Discharge Concerns: Need to establish a safety plan; Medication compliance and effectiveness Discharge Goals: Return home with outpatient referrals for mental health follow-up including medication management/psychotherapy   I certify that inpatient services furnished can reasonably be expected to improve the patient's condition.     Starleen Blue, NP 06/09/2022, 3:02 PM

## 2022-06-09 NOTE — [facility] Group Notes (Signed)
[facility] Group Notes:  (Nursing/MHT/Case Management/Adjunct)  Date:  06/09/2022  Time:  9:29 PM  Type of Therapy:   Wrap-up group  Participation Level:  Active  Participation Quality:  Appropriate  Affect:  Appropriate  Cognitive:  Appropriate  Insight:  Appropriate  Engagement in Group:  Engaged  Modes of Intervention:  Education  Summary of Progress/Problems: Goal to focus on present moment, day 7/10. Participated in an emotional skills exercise.   Noah Delaine 06/09/2022, 9:29 PM

## 2022-06-09 NOTE — Progress Notes (Incomplete)
   06/09/22 1930  Psych Admission Type (Psych Patients Only)  Admission Status Voluntary/72 hour document signed  Psychosocial Assessment  Patient Complaints None  Eye Contact Brief  Facial Expression Animated  Affect Anxious  Speech Logical/coherent  Interaction Assertive  Motor Activity Fidgety  Appearance/Hygiene Unremarkable  Behavior Characteristics Cooperative  Mood Pleasant  Thought Process  Coherency WDL  Content WDL  Delusions None reported or observed  Perception WDL  Hallucination None reported or observed  Judgment Impaired  Confusion None  Danger to Self  Current suicidal ideation? Denies  Self-Injurious Behavior No self-injurious ideation or behavior indicators observed or expressed   Agreement Not to Harm Self Yes  Description of Agreement Verbally contracts for safety.   Patient alert and oriented. Presenting with an anxious affect and pleasant mood. Patient rates anxiety 8/10 and denies depression. Patient denies SI, HI, AVH, and pain.  Scheduled prazosin held to prevent further drop of BP. Support and encouragement provided. Routine safety checks conducted every 15 minutes. Patient verbally contracts for safety and remains safe on unit.

## 2022-06-09 NOTE — [facility] Group Notes (Signed)
Adult Psychoeducational Group Note  Date:  06/09/2022 Time:  9:33 AM  Group Topic/Focus:  Orientation:   The focus of this group is to educate the patient on the purpose and policies of crisis stabilization and provide a format to answer questions about their admission.  The group details unit policies and expectations of patients while admitted.  Participation Level:  Did Not Attend  Participation Quality:    Affect:    Cognitive:    Insight:   Engagement in Group:    Modes of Intervention:    Additional Comments:    Lashan Macias A Gabriana Wilmott-McCall 06/09/2022, 9:33 AM

## 2022-06-09 NOTE — Progress Notes (Addendum)
D:  Patient's self inventory sheet, patient sleeps good, sleep medicine helpful.  Fair appetite, normal energy level, good concentration.  Denied depression and hopeless.  Anxiety 6.  Denied withdrawals, then checked agitation, irritability.  Denied SI.  Denied physical problems.  Denied physical pain.  Pain medicine helpful.  Goal is think positive.  No discharge plans. A:  Medications administered per MD orders.  Emotional support and encouragement given patient. R:  Denied SI and HI, contracts for safety.  Denied A/V hallucinations.  Safety maintained with 15 minute checks.  72 HOUR REQUEST FOR DISCHARGE FORM SIGNED 06/09/2022 AT 9604.

## 2022-06-09 NOTE — Progress Notes (Signed)
72 Hour Document Signed Note  Patient Details Name: [name] MRN: [id number] DOB: May 16, 1990 Today's Date: 06/09/2022   72 Hour Signed Documentation:  Admission Status: Voluntary/72 hour document signed Date 72 hour document signed : 06/09/22 Time 72 hour document signed : [id number] Provider Notified (First and Last Name) (see details for [facility] to note): Nadir Abbott Pao, MD    Mee Hives 06/09/2022, 7:35 AM

## 2022-06-09 NOTE — [facility] Group Notes (Signed)
Adult Psychoeducational Group Note  Date:  06/09/2022 Time:  2:54 PM  Group Topic/Focus:  Emotional Education:   The focus of this group is to discuss what feelings/emotions are, and how they are experienced.  The group focused on the process of reframing thought process from negative to positive.  Participation Level:  Did Not Attend  Participation Quality:    Affect:    Cognitive:    Insight:   Engagement in Group:    Modes of Intervention:    Additional Comments:    [name] A Keoshia Steinmetz-McCall 06/09/2022, 2:54 PM

## 2022-06-09 NOTE — Progress Notes (Addendum)
   06/08/22 2000  Psych Admission Type (Psych Patients Only)  Admission Status Voluntary  Psychosocial Assessment  Patient Complaints Anxiety;Depression;Self-harm thoughts  Eye Contact Darting  Facial Expression Animated  Affect Anxious  Speech Logical/coherent  Interaction Assertive  Motor Activity Fidgety  Appearance/Hygiene Unremarkable  Behavior Characteristics Cooperative;Appropriate to situation  Mood Anxious  Thought Process  Coherency Circumstantial  Content WDL  Delusions Paranoid  Perception Hallucinations  Hallucination Auditory  Judgment Impaired  Confusion None  Danger to Self  Current suicidal ideation? Denies  Self-Injurious Behavior No self-injurious ideation or behavior indicators observed or expressed   Agreement Not to Harm Self Yes  Description of Agreement Verbally contracts for safety.  Danger to Others  Danger to Others None reported or observed  Danger to Others Abnormal  Harmful Behavior to others No threats or harm toward other people  Destructive Behavior No threats or harm toward property   Patient alert and oriented. Presenting with an anxious affect and mood. Patient denies SI, HI. Patient endorses AVH of "voices singing a song telling me to bang my head, but i'm not going to do it.". Patient was redirectable, positive reinforcement activities were discussed and completed by patient. Patient verbally contracts for safety and agrees to inform a member of staff prior to acting on any thoughts of harm. Patient rated pain 3/10 related to TMJ, patient states that she does not want PRN for pain at this time. Patient educated to inform RN if she changes her mind, or pain increases. PRN hydroxyzine administered to patient per provider orders, patient rated anxiety 7/10. Patient endorsed depression 3/10. Scheduled Corlanor, prazosin and flexeril held due to diastolic BP. Support and encouragement provided. Routine safety checks conducted every 15 minutes. Patient  remains safe on the unit.

## 2022-06-09 NOTE — Plan of Care (Signed)
Nurse discussed coping skills with patient.  

## 2022-06-10 DIAGNOSIS — F250 Schizoaffective disorder, bipolar type: Principal | ICD-10-CM

## 2022-06-10 NOTE — Group Note (Signed)
Date:  06/10/2022 Time:  6:27 PM  Group Topic/Focus:  Dimensions of Wellness:   The focus of this group is to introduce the topic of wellness and discuss the role each dimension of wellness plays in total health.    Participation Level:  Did Not Attend  Participation Quality:   n/a  Affect:   n/a  Cognitive:   n/a  Insight: None  Engagement in Group:   n/a  Modes of Intervention:   n/a  Additional Comments:   Pt did not attend.  [name] [name] 06/10/2022, 6:27 PM

## 2022-06-10 NOTE — Group Note (Signed)
Date:  06/10/2022 Time:  11:33 AM  Group Topic/Focus:  Goals Group:   The focus of this group is to help patients establish daily goals to achieve during treatment and discuss how the patient can incorporate goal setting into their daily lives to aide in recovery. Orientation:   The focus of this group is to educate the patient on the purpose and policies of crisis stabilization and provide a format to answer questions about their admission.  The group details unit policies and expectations of patients while admitted.    Participation Level:  Did Not Attend  Participation Quality:   n/a  Affect:   n/a  Cognitive:   n/a  Insight: None  Engagement in Group:   n/a  Modes of Intervention:   n/a  Additional Comments:   Pt did not attend  Stark Bray 06/10/2022, 11:33 AM

## 2022-06-10 NOTE — Plan of Care (Signed)
Nurse discussed anxiety, depression and coping skills with patient.  

## 2022-06-10 NOTE — Group Note (Signed)
Date:  06/10/2022 Time:  6:53 PM  Group Topic/Focus:  Dimensions of Wellness:   The focus of this group is to introduce the topic of wellness and discuss the role each dimension of wellness plays in total health.    Participation Level:  Did Not Attend  Participation Quality:   n/a  Affect:   n/a  Cognitive:   n/a  Insight: None  Engagement in Group:   n/a  Modes of Intervention:   n/a  Additional Comments:   Pt did not attend.  [name] 06/10/2022, 6:53 PM

## 2022-06-10 NOTE — Progress Notes (Signed)
   06/10/22 2154  Psych Admission Type (Psych Patients Only)  Admission Status Voluntary/72 hour document signed  Psychosocial Assessment  Patient Complaints Other (Comment) (low b/p)  Eye Contact Fair  Facial Expression Animated  Affect Anxious  Speech Logical/coherent  Interaction Assertive  Motor Activity Fidgety  Appearance/Hygiene Unremarkable  Behavior Characteristics Cooperative  Mood Preoccupied  Thought Process  Coherency WDL  Content WDL  Delusions None reported or observed  Perception WDL  Hallucination None reported or observed  Judgment Impaired  Confusion None  Danger to Self  Current suicidal ideation? Denies  Self-Injurious Behavior No self-injurious ideation or behavior indicators observed or expressed   Agreement Not to Harm Self Yes  Description of Agreement verbal contract for safety  Danger to Others  Danger to Others None reported or observed  Danger to Others Abnormal  Harmful Behavior to others No threats or harm toward other people  Destructive Behavior No threats or harm toward property     D: Patient reports she is doing well and looking forward to discharge. Pt stated she secured a voluntary job at [employer] job. Pt frustrated about not able to get her medication due to her low B/P.   A: Medications administered as prescribed. Support and encouragement provided to increase fluid intake.  R: Patient remains safe on the unit. No further concerns as of present.

## 2022-06-10 NOTE — [facility] Group Notes (Signed)
Adult Psychoeducational Group Note  Date:  06/10/2022 Time:  10:45 PM  Group Topic/Focus:  Wrap-Up Group:   The focus of this group is to help patients review their daily goal of treatment and discuss progress on daily workbooks.  Participation Level:  Active  Participation Quality:  Appropriate  Affect:  Appropriate  Cognitive:  Appropriate  Insight: Appropriate  Engagement in Group:  Engaged  Modes of Intervention:  Discussion and Support  Additional Comments:  Pt attended the evening group and responded to all discussion prompts from the Writer. Pt shared that today was a good day on the unit, the highlight of which was catching up on rest. On the subject of goals for the coming week, Pt mentioned wanting to acquire a type of locked pill box that will help her not overdose on her medication. Pt rated her day a 5 out of 10.  Christ Kick 06/10/2022, 10:45 PM

## 2022-06-10 NOTE — Group Note (Signed)
Date:  06/10/2022 Time:  4:33 PM  Group Topic/Focus:  Dimensions of Wellness:   The focus of this group is to introduce the topic of wellness and discuss the role each dimension of wellness plays in total health.    Participation Level:  Did Not Attend  Participation Quality:   n/a  Affect:   n/a  Cognitive:   n/a  Insight: None  Engagement in Group:   n/a  Modes of Intervention:   n/a  Additional Comments:   Pt did not attend.  [name] 06/10/2022, 4:33 PM

## 2022-06-10 NOTE — Plan of Care (Addendum)
D:  Patient's self inventory sheet, patient sleeps good, sleep medication helpful.  Good appetite, normal energy level, good concentration.  Rated depression 3, anxiety 5, denied hopeless.  Denied withdrawals, then checked agitation, irritability.  Denied SI.  Denied physical problems.  Physical pain.  Pain medicine helpful.  Goal is attend groups.  Plans to attend groups.  Does have discharge plans. A:  Medications administered per MD orders.  Emotional support and encouragement given patient. R:  Patient denied SI and HI, contracts for safety.  Denied A/V hallucinations.  Safety maintained with 15 minute checks.

## 2022-06-10 NOTE — Progress Notes (Cosign Needed Addendum)
[facility] MD Progress Note  06/10/2022 2:23 PM [name]  MRN:  [id number] Subjective:    [name] is a 32 year old, Caucasian female with a past psychiatric history significant for PTSD, borderline personality disorder, and schizoaffective disorder who was transported to [facility] ED on 4/29 after calling 911 to report that she had taken an overdose of the weeks' worth of medications.  On arrival to the ED, patient reported that she took the following: 7 trazodone, 7 Lexapro, 9 Vraylar, 7 Protonix, 7 Flexeril, 25 Vistaril, 7 Zyprexa, 7 prazosin, and 14 metoprolol in a suicide attempt.  Once patient was medically treated and stabilized at [facility], patient was transferred to [facility] voluntarily for treatment and stabilization of her mental status.  Patient was assessed by Karel Jarvis, PA-C for reevaluation.  Patient is currently being managed on the following psychiatric medications:  Vraylar 1.5 mg daily Escitalopram 10 mg daily Hydroxyzine 25 mg 3 times daily as needed Melatonin 3 mg at bedtime Prazosin 1 mg at bedtime Trazodone 50 mg at bedtime as needed  Agitation protocol  -Olanzapine Zydis 5 mg, PO or Inj, 3 times daily as needed for agitation  -Lorazepam 2 mg Inj 3 times daily as needed for agitation  24-hour chart review: Patient signed a 72-hour release form yesterday.  Per self inventory sheet completed by the patient, patient endorsed good sleep stating that her medications were helpful.  She endorses fair appetite, normal energy level, and good concentration.  She denied depression but endorsed anxiety she rates as a 6 out of 10.  She denied withdrawals; however she checked agitation and irritability.  Amount of hours slept by the patient was not documented; however, patient reported receiving roughly 8 to 9 hours of sleep last night.  Patient's prazosin was held to prevent further drop of blood pressure yesterday.  During the start of the encounter, patient  requested to have her Vraylar increased from 1.5 mg to 3 mg stating that she usually takes that amount of the medication.  Patient acknowledges that she has signed a 72-hour release form and states that she signed the form due to volunteering at [facility] [employer] and having to work at [employer].  Patient reports that she overdosed due to going through a lot.  She acknowledges that she overdosing was not the smartest thing for her to do but states that she was not attempting suicide but rather, trying to escape the current situation she was then.  Patient endorses minimal depression and endorses some anxiety due to being ready to go home.  Patient denies any new stressors at this time, besides her landlord.  Patient denies any other issues or concerns and states that she feels okay.  Patient endorses normal mood.  Patient denies suicidal or homicidal ideations.  She further denies auditory or visual hallucinations and does not appear to be responding to internal/external stimuli.  Patient denies paranoia or delusional thoughts.  Patient endorses good sleep and receives on average 8 to 9 hours of sleep each night.  Patient endorses good appetite and eats on average 3 meals per day.  Patient is alert and oriented x 4, calm, cooperative, and fully engaged in conversation during the encounter.  Patient maintains good eye contact.  Patient's speech is clear, coherent, and with normal rate.  Patient's thought content is logical.  Patient's thought process is coherent.  Patient endorses minimal depression and some anxiety with appropriate affect.  Patient denies suicidal ideations.  She further  denies auditory or visual hallucinations and does not appear to be responding to internal/external stimuli.  Principal Problem: Schizoaffective disorder, bipolar type (HCC) Diagnosis: Principal Problem:   Schizoaffective disorder, bipolar type (HCC) Active Problems:   PTSD (post-traumatic stress disorder)   Asthma    Fibromyalgia   Insomnia  Total Time spent with patient: 20 minutes  Past Psychiatric History:  Previous Psych Diagnoses: Schizoaffective disorder bipolar type, Anxiety, Autism spectrum disorder, PTSD, Borderline personality disorder  Prior inpatient treatment: Multiple. This is the 8th [facility] admission within a 12 month period. Outpatient treatment: has ACT team   Prior rehab hx: Denies History of suicide: multiple attempts mostly via overdose on her prescription medications. Most is this admission. History of homicide: Denies Psychiatric medication history: Previously trialed Tegretol, Haldol, Clozaril, Depakote, Invega, and Lamictal  Psychiatric medication compliance history: Poor  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 fragile X syndrome    Chronic constipation    Depression    Drug-seeking behavior    Essential tremor    Headache    Overdose of acetaminophen 07/2017   and other meds   Personality disorder (HCC)    Schizo-affective psychosis (HCC)    Schizoaffective disorder, bipolar type (HCC)    Seizures (HCC)    Last seizure December 2017   Sleep apnea     Past Surgical History:  Procedure Laterality Date   MOUTH SURGERY  2009 or 2010   Family History:  Family History  Problem Relation Age of Onset   Mental illness Father    Asthma Father    PDD Brother    Seizures Brother    Family Psychiatric  History:  See H&P  Social History:  Social History   Substance and Sexual Activity  Alcohol Use No   Alcohol/week: 1.0 standard drink of alcohol   Types: 1 Standard drinks or equivalent per week   Comment: denies at this time     Social History   Substance and Sexual Activity  Drug Use No   Comment: History of cocaine use at age 40 for 4 months    Social History   Socioeconomic History   Marital status: Widowed    Spouse name: Not on file    Number of children: 0   Years of education: Not on file   Highest education level: Not on file  Occupational History   Occupation: disability  Tobacco Use   Smoking status: Former    Packs/day: 0    Types: Cigarettes   Smokeless tobacco: Never   Tobacco comments:    Smoked for 2  years age [age]  Vaping Use   Vaping Use: Never used  Substance and Sexual Activity   Alcohol use: No    Alcohol/week: 1.0 standard drink of alcohol    Types: 1 Standard drinks or equivalent per week    Comment: denies at this time   Drug use: No    Comment: History of cocaine use at age 45 for 4 months   Sexual activity: Not Currently    Birth control/protection: None  Other Topics Concern   Not on file  Social History Narrative   Marital status: Widowed      Children: daughter      Lives: with boyfriend, in two story home      Employment:  Disability  Tobacco: quit smoking; smoked for two years.      Alcohol ;none      Drugs: none   Has not traveled outside of the country.   Right handed         Social Determinants of Health   Financial Resource Strain: Not on file  Food Insecurity: No Food Insecurity (06/08/2022)   Hunger Vital Sign    Worried About Running Out of Food in the Last Year: Never true    Ran Out of Food in the Last Year: Never true  Transportation Needs: No Transportation Needs (06/08/2022)   PRAPARE - Administrator, Civil Service (Medical): No    Lack of Transportation (Non-Medical): No  Recent Concern: Transportation Needs - Unmet Transportation Needs (05/06/2022)   PRAPARE - Administrator, Civil Service (Medical): Yes    Lack of Transportation (Non-Medical): Yes  Physical Activity: Not on file  Stress: Not on file  Social Connections: Not on file   Additional Social History:  See H&P  Sleep: Good  Appetite:  Good  Current Medications: Current Facility-Administered Medications  Medication Dose Route Frequency Provider Last Rate Last  Admin   albuterol (VENTOLIN HFA) 108 (90 Base) MCG/ACT inhaler 2 puff  2 puff Inhalation Q4H PRN Starleen Blue, NP   2 puff at 06/09/22 1649   alum & mag hydroxide-simeth (MAALOX/MYLANTA) 200-200-20 MG/5ML suspension 30 mL  30 mL Oral Q4H PRN Starkes-Perry, Juel Burrow, FNP       cariprazine (VRAYLAR) capsule 1.5 mg  1.5 mg Oral Daily Massengill, Nathan, MD   1.5 mg at 06/10/22 0801   cyclobenzaprine (FLEXERIL) tablet 10 mg  10 mg Oral QHS Maryagnes Amos, FNP   10 mg at 06/09/22 2101   diclofenac Sodium (VOLTAREN) 1 % topical gel 2 g  2 g Topical BID Starleen Blue, NP   2 g at 06/10/22 0801   escitalopram (LEXAPRO) tablet 10 mg  10 mg Oral Daily Maryagnes Amos, FNP   10 mg at 06/10/22 0801   hydrOXYzine (ATARAX) tablet 25 mg  25 mg Oral TID PRN Phineas Inches, MD   25 mg at 06/10/22 0324   ibuprofen (ADVIL) tablet 400 mg  400 mg Oral Q6H PRN Maryagnes Amos, FNP   400 mg at 06/09/22 1821   ivabradine (CORLANOR) tablet 5 mg  5 mg Oral BID WC Nkwenti, Doris, NP   5 mg at 06/10/22 0802   lidocaine (LIDODERM) 5 % 1 patch  1 patch Transdermal Q24H Starkes-Perry, Juel Burrow, FNP       LORazepam (ATIVAN) injection 2 mg  2 mg Intramuscular TID PRN Starkes-Perry, Juel Burrow, FNP       magnesium citrate solution 1 Bottle  1 Bottle Oral Once Nkwenti, Doris, NP       magnesium hydroxide (MILK OF MAGNESIA) suspension 30 mL  30 mL Oral Daily PRN Maryagnes Amos, FNP   30 mL at 06/08/22 1630   melatonin tablet 3 mg  3 mg Oral QHS Massengill, Nathan, MD   3 mg at 06/09/22 2101   mometasone-formoterol (DULERA) 200-5 MCG/ACT inhaler 2 puff  2 puff Inhalation BID Maryagnes Amos, FNP   2 puff at 06/10/22 0802   neomycin-bacitracin-polymyxin (NEOSPORIN) ointment   Topical PRN Phineas Inches, MD   Given at 06/08/22 1338   neomycin-bacitracin-polymyxin (NEOSPORIN) ointment   Topical BID Starleen Blue, NP   Given at 06/10/22 0803   OLANZapine zydis (ZYPREXA) disintegrating tablet 5  mg  5 mg Oral TID PRN Phineas Inches, MD   5 mg at 06/08/22 2137   Or   OLANZapine (ZYPREXA) injection 5 mg  5 mg Intramuscular TID PRN Massengill, Harrold Donath, MD       pantoprazole (PROTONIX) EC tablet 40 mg  40 mg Oral Daily Maryagnes Amos, FNP   40 mg at 06/10/22 1610   prazosin (MINIPRESS) capsule 1 mg  1 mg Oral QHS Starkes-Perry, Juel Burrow, FNP       pregabalin (LYRICA) capsule 100 mg  100 mg Oral TID Maryagnes Amos, FNP   100 mg at 06/10/22 1147   traZODone (DESYREL) tablet 50 mg  50 mg Oral QHS PRN Phineas Inches, MD   50 mg at 06/09/22 2101    Lab Results: No results found for this or any previous visit (from the past 48 hour(s)).  Blood Alcohol level:  Lab Results  Component Value Date   ETH <10 06/04/2022   ETH <10 05/25/2022    Metabolic Disorder Labs: Lab Results  Component Value Date   HGBA1C 5.2 04/20/2022   MPG 103 04/20/2022   MPG 96.8 10/07/2021   Lab Results  Component Value Date   PROLACTIN 66.2 (H) 03/02/2021   PROLACTIN 66.1 (H) 02/04/2020   Lab Results  Component Value Date   CHOL 191 04/20/2022   TRIG 163 (H) 04/20/2022   HDL 52 04/20/2022   CHOLHDL 3.7 04/20/2022   VLDL 33 04/20/2022   LDLCALC 106 (H) 04/20/2022   LDLCALC 102 (H) 12/10/2021    Physical Findings: AIMS:  , ,  ,  ,    CIWA:    COWS:     Musculoskeletal: Strength & Muscle Tone: within normal limits Gait & Station: normal Patient leans: N/A  Psychiatric Specialty Exam:  Presentation  General Appearance:  Appropriate for Environment; Casual  Eye Contact: Good  Speech: Clear and Coherent; Normal Rate  Speech Volume: Normal  Handedness: Right   Mood and Affect  Mood: Anxious; Depressed  Affect: Congruent   Thought Process  Thought Processes: Coherent  Descriptions of Associations:Intact  Orientation:Full (Time, Place and Person)  Thought Content:WDL  History of Schizophrenia/Schizoaffective disorder:Yes  Duration of Psychotic  Symptoms:Greater than six months  Hallucinations:Hallucinations: None Description of Auditory Hallucinations: Patient denies  Ideas of Reference:None  Suicidal Thoughts:Suicidal Thoughts: No SI Active Intent and/or Plan: -- (Patient denies) SI Passive Intent and/or Plan: -- (Patient denies)  Homicidal Thoughts:Homicidal Thoughts: No   Sensorium  Memory: Immediate Good; Recent Good  Judgment: Fair  Insight: Fair   Art therapist  Concentration: Good  Attention Span: Good  Recall: Fair  Fund of Knowledge: Fair  Language: Good   Psychomotor Activity  Psychomotor Activity: Psychomotor Activity: Normal   Assets  Assets: Communication Skills; Desire for Improvement   Sleep  Sleep: Sleep: Good    Physical Exam: Physical Exam Constitutional:      Appearance: Normal appearance.  HENT:     Head: Normocephalic and atraumatic.     Nose: Nose normal.     Mouth/Throat:     Mouth: Mucous membranes are moist.  Eyes:     Extraocular Movements: Extraocular movements intact.  Cardiovascular:     Rate and Rhythm: Tachycardia present.  Pulmonary:     Effort: Pulmonary effort is normal.  Musculoskeletal:        General: Normal range of motion.     Cervical back: Normal range of motion.  Skin:    General: Skin is warm.  Neurological:  Mental Status: She is alert and oriented to person, place, and time.  Psychiatric:        Attention and Perception: Attention and perception normal. She does not perceive auditory or visual hallucinations.        Mood and Affect: Mood is anxious and depressed.        Speech: Speech normal.        Behavior: Behavior normal. Behavior is cooperative.        Thought Content: Thought content normal. Thought content is not paranoid or delusional. Thought content does not include homicidal or suicidal ideation. Thought content does not include suicidal plan.        Cognition and Memory: Cognition and memory normal.         Judgment: Judgment is inappropriate.    Review of Systems  Constitutional: Negative.   HENT: Negative.    Eyes: Negative.   Respiratory: Negative.    Cardiovascular: Negative.   Gastrointestinal: Negative.   Skin: Negative.   Neurological: Negative.   Psychiatric/Behavioral:  Positive for depression. Negative for hallucinations, substance abuse and suicidal ideas. The patient is nervous/anxious. The patient does not have insomnia.    Blood pressure 101/83, pulse (!) 101, temperature 98.3 F (36.8 C), temperature source Oral, resp. rate 18, height 5\' 5"  (1.651 m), weight 94.3 kg, last menstrual period 04/25/2022, SpO2 98 %. Body mass index is 34.61 kg/m.   Treatment Plan Summary: Daily contact with patient to assess and evaluate symptoms and progress in treatment and Medication management  Plan:   Patient reports that she signed a 72-hour release form due to volunteering at [facility] and having to work at [employer].  Patient reports no issues or concerns regarding her current medication regimen and denies experiencing any adverse side effects.  She does request to have her Vraylar increased from 1.5 mg to 3 mg daily since she is used to taking 3 mg of Vraylar.  Patient endorses minimal depression and some anxiety due to wanting to be discharged.  Patient appears to show some remorse over her previous overdose.  Patient will continue taking medications as prescribed.  Safety and Monitoring: Voluntary admission to inpatient psychiatric unit for safety, stabilization and treatment Daily contact with patient to assess and evaluate symptoms and progress in treatment Patient's case to be discussed in multi-disciplinary team meeting Observation Level : q15 minute checks Vital signs: q12 hours Precautions: suicide, elopement, and assault   Diagnosis: Principal Problem:   Schizoaffective disorder, bipolar type (HCC) Active Problems:   PTSD (post-traumatic stress disorder)    Asthma   Fibromyalgia   Insomnia  #Schizoaffective disorder (bipolar type) -Continue current presenting 1.5 mg daily for psychosis and mood stabilization -Continue Lexapro 10 mg daily for depressive symptoms  #PTSD -Continue Lexapro 10 mg daily for PTSD -Continue prazosin 1 mg at bedtime for PTSD  #Asthma -Continue albuterol 2 puffs, inhalation, every 4 hours as needed for wheezing and shortness of breath -Continue mometasone-formoterol 2 puffs, inhalation, 2 times daily  #Fibromyalgia -Continue pregabalin 100 mg 3 times daily for fibromyalgia -Continue cyclobenzaprine 10 mg at bedtime for fibromyalgia  #Insomnia -Continue trazodone 50 mg at bedtime as needed for sleep -Continue melatonin 3 mg daily at bedtime for sleep  Agitation protocol  -Olanzapine Zydis 5 mg, PO or Inj, 3 times daily as needed for agitation  -Lorazepam 2 mg Inj 3 times daily as needed for agitation  -Continue Corlanor 5 mg tabs for tachycardia BID (home medication)   As needed  medications: -Patient to continue taking Tylenol 650 mg every 6 hours as needed for mild pain -Patient to continue taking Maalox/Mylanta 30 mL every 4 hours as needed for indigestion -Patient to continue taking Milk of Magnesia 30 mL as needed for mild constipation -Hydroxyzine 25 mg 3 times daily as needed for anxiety  Discharge Planning: Social work and case management to assist with discharge planning and identification of hospital follow-up needs prior to discharge Estimated LOS: 5-7 days Discharge Concerns: Need to establish a safety plan; Medication compliance and effectiveness Discharge Goals: Return home with outpatient referrals for mental health follow-up including medication management/psychotherapy   I certify that inpatient services furnished can reasonably be expected to improve the patient's condition.    Meta Hatchet, PA 06/10/2022, 2:23 PM

## 2022-06-10 NOTE — Progress Notes (Signed)
Patient stated she wants to make sure that her dose vraylar is increased per MD to 300 mg.  Patient was told she would be discharged Tuesday.

## 2022-06-11 MED ORDER — DICLOFENAC SODIUM 1 % EX GEL: 2.0000 g | Freq: Two times a day (BID) | CUTANEOUS | 0 refills | Status: DC

## 2022-06-11 MED ORDER — MELATONIN 3 MG PO TABS: 3.0000 mg | ORAL_TABLET | Freq: Every day | ORAL | 0 refills | Status: DC

## 2022-06-11 MED ORDER — CARIPRAZINE HCL 1.5 MG PO CAPS: 1.5000 mg | ORAL_CAPSULE | Freq: Every day | ORAL | 0 refills | Status: DC

## 2022-06-11 MED ORDER — HYDROXYZINE HCL 25 MG PO TABS: 25.0000 mg | ORAL_TABLET | Freq: Three times a day (TID) | ORAL | 0 refills | Status: DC | PRN

## 2022-06-11 NOTE — Transportation (Signed)
06/11/2022  [name] DOB: 1990/10/13 MRN: [id number]   RIDER WAIVER AND RELEASE OF LIABILITY  For the purposes of helping with transportation needs, [place] partners with outside transportation providers (taxi companies, [place], Catering manager.) to give [employer] patients or other approved people the choice of on-demand rides [employer]") to our buildings for non-emergency visits.  By using [employer], I, the person signing this document, on behalf of myself and/or any legal minors (in my care using the [employer]), agree:  Science writer given to me are supplied by independent, outside transportation providers who do not work for, or have any affiliation with, [employer]. [place] is not a transportation company. [place] has no control over the quality or safety of the rides I get using [employer]. [place] has no control over whether any outside ride will happen on time or not. [facility] gives no guarantee on the reliability, quality, safety, or availability on any rides, or that no mistakes will happen. I know and accept that traveling by vehicle (car, truck, SVU, Zenaida Niece, bus, taxi, etc.) has risks of serious injuries such as disability, being paralyzed, and death. I know and agree the risk of using [employer] is mine alone, and not [employer]. Transport Services are provided "as is" and as are available. The transportation providers are in charge for all inspections and care of the vehicles used to provide these rides. I agree not to take legal action against [place], its agents, employees, officers, directors, representatives, insurers, attorneys, assigns, successors, subsidiaries, and affiliates at any time for any reasons related directly or indirectly to using [employer]. I also agree not to take legal action against [place] or its affiliates for any injury, death, or damage to property caused by or related to  using [employer]. I have read this Waiver and Release of Liability, and I understand the terms used in it and their legal meaning. This Waiver is freely and voluntarily given with the understanding that my right (or any legal minors) to legal action against [place] relating to [employer] is knowingly given up to use these services.   I attest that I read the Ride Waiver and Release of Liability to [name], gave Ms. Hain the opportunity to ask questions and answered the questions asked (if any). I affirm that [name] then provided consent for assistance with transportation.

## 2022-06-11 NOTE — Discharge Summary (Signed)
Physician Discharge Summary Note  Patient:  [name] is an 32 y.o., female MRN:  [id number] DOB:  [birth year] Patient phone:  [phone] (home)  Patient address:   [address] [place] [address],  Total Time spent with patient: 45 minutes  Date of Admission:  06/07/2022 Date of Discharge: 06/11/2022  Reason for Admission:  Ludora Saab. Maddaloni is a 32 yo CF with past mental health diagnoses of PTSD, borderline personality d/o & schizoaffective d/o who was transported to the [facility] on 4/29 after she called 911 to report that she had taken an overdose of a weeks' worth of her medications. Pt reported on arrival to the ED that she took the following: 7 Trazodone, 7 Lexapro, 9 Vraylar, 7 Protonix, 7 flexeril, 25 Vistaril, 7 Zyprexa, 7 Prazosins, 14 Metoprolol in a suicide attempt." Pt was medically treated & stabilized prior to being transferred to this [facility] voluntarily for treatment and stabilization of her mental status.   Principal Problem: Schizoaffective disorder, bipolar type [facility]) Discharge Diagnoses: Principal Problem:   Schizoaffective disorder, bipolar type (HCC) Active Problems:   PTSD (post-traumatic stress disorder)   Asthma   Fibromyalgia   Insomnia  Past Psychiatric History: See H & P  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 fragile X syndrome    Chronic constipation    Depression    Drug-seeking behavior    Essential tremor    Headache    Overdose of acetaminophen 07/2017   and other meds   Personality disorder (HCC)    Schizo-affective psychosis (HCC)    Schizoaffective disorder, bipolar type (HCC)    Seizures (HCC)    Last seizure December 2017   Sleep apnea     Past Surgical History:  Procedure Laterality Date   MOUTH SURGERY  2009 or 2010   Family History:  Family History  Problem Relation Age of Onset    Mental illness Father    Asthma Father    PDD Brother    Seizures Brother    Family Psychiatric  History: See H & P Social History:  Social History   Substance and Sexual Activity  Alcohol Use No   Alcohol/week: 1.0 standard drink of alcohol   Types: 1 Standard drinks or equivalent per week   Comment: denies at this time     Social History   Substance and Sexual Activity  Drug Use No   Comment: History of cocaine use at age 56 for 4 months    Social History   Socioeconomic History   Marital status: Widowed    Spouse name: Not on file   Number of children: 0   Years of education: Not on file   Highest education level: Not on file  Occupational History   Occupation: disability  Tobacco Use   Smoking status: Former    Packs/day: 0    Types: Cigarettes   Smokeless tobacco: Never   Tobacco comments:    Smoked for 2  years age [age]  Vaping Use   Vaping Use: Never used  Substance and Sexual Activity   Alcohol use: No    Alcohol/week: 1.0 standard drink of alcohol    Types: 1 Standard drinks or equivalent per week    Comment: denies at this time   Drug use: No  Comment: History of cocaine use at age 58 for 4 months   Sexual activity: Not Currently    Birth control/protection: None  Other Topics Concern   Not on file  Social History Narrative   Marital status: Widowed      Children: daughter      Lives: with boyfriend, in two story home      Employment:  Disability      Tobacco: quit smoking; smoked for two years.      Alcohol ;none      Drugs: none   Has not traveled outside of the country.   Right handed         Social Determinants of Health   Financial Resource Strain: Not on file  Food Insecurity: No Food Insecurity (06/08/2022)   Hunger Vital Sign    Worried About Running Out of Food in the Last Year: Never true    Ran Out of Food in the Last Year: Never true  Transportation Needs: No Transportation Needs (06/08/2022)   PRAPARE - Therapist, art (Medical): No    Lack of Transportation (Non-Medical): No  Recent Concern: Transportation Needs - Unmet Transportation Needs (05/06/2022)   PRAPARE - Administrator, Civil Service (Medical): Yes    Lack of Transportation (Non-Medical): Yes  Physical Activity: Not on file  Stress: Not on file  Social Connections: Not on file   Hospital Course:  During the patient's hospitalization, patient had extensive initial psychiatric evaluation, and follow-up psychiatric evaluations every day. Psychiatric diagnoses provided upon initial assessment are, listed as above.  Patient's psychiatric medications were adjusted on admission as listed below: -Started Vraylar 1.5 mg on 5/5 (delay d/t long half life)-Od'd on med -Continued Lexapro 10 mg daily for depressive symptoms -Started Hydroxyzine 25 mg TID PRN for anxiety -Stopped Scheduled Zyprexa -Started Trazodone 50 prn nightly for sleep -Started Melatonin 3 mg nightly for sleep -Stopped Toprol 25 mg daily for tachycardia -Started Corlanor 5 mg tabs for tachycardia BID (home medication) -Continued Prazosin 1 mg nightly for nightmares -Continued Lyrica 100mg  TID for pain -Continued Dulera inhaler for asthma -Started albuterol inhaler for asthma prn -Started Neosporin BID to left arm cut   During the hospitalization, other adjustments were made to the patient's psychiatric medication regimen. Medications at discharge are:   #Schizoaffective disorder (bipolar type) -Continue current dose of Vraylar 1.5 mg daily for psychosis and mood stabilization-Outpatient mental health provider to titrate medication up as needed to reach prior to admission dose of 4.5 mg/day. This prescription has been sent into pt's pharmacy along with Voltaren, Hydroxyzine and Melatonin. Pt states that she has refills of her other medications, and has already called her pharmacy to have those refills ready at her pharmacy for pick up. She has been  educated that only her CST team is allowed to pick her medications at the pharmacy due to her risk of her overdose as evidenced by a history of repeated overdoses in the past. She has verbalized understanding.   Labs: Lipid panel on 3/15 with triglycerides slightly elevated at 163 otherwise, WNL. TSH on same date WNL. HA1C WNL. EKG is slightly elevated at 464. Pt educated on the need to repeat EKG for trending on an outpatient basis. She is being discharged today prior to one being completed since she signed her 72 hr notice for discharge and it expires tomorrow morning at 0600.    -Continue Lexapro 10 mg daily for depressive symptoms   #PTSD -  Continue Lexapro 10 mg daily for PTSD -Continue prazosin 1 mg at bedtime for PTSD   #Asthma -Continue albuterol 2 puffs, inhalation, every 4 hours as needed for wheezing and shortness of breath -Continue mometasone-formoterol 2 puffs, inhalation, 2 times daily   #Fibromyalgia -Continue pregabalin 100 mg 3 times daily for fibromyalgia   -Continue cyclobenzaprine 10 mg at bedtime for fibromyalgia   #Insomnia -Continue trazodone 50 mg at bedtime as needed for sleep -Continue melatonin 3 mg daily at bedtime for sleep     Patient's care was discussed during the interdisciplinary team meeting every day during the hospitalization. The patient denies having side effects to prescribed psychiatric medication. Gradually, patient started adjusting to milieu. The patient was evaluated each day by a clinical provider to ascertain response to treatment. Improvement was noted by the patient's report of decreasing symptoms, improved sleep and appetite, affect, medication tolerance, behavior, and participation in unit programming.  Patient was asked each day to complete a self inventory noting mood, mental status, pain, new symptoms, anxiety and concerns. Symptoms were reported as significantly decreased or resolved completely by discharge.    On day of discharge, the  patient reports that their mood is stable. The patient denied having suicidal thoughts for more than 48 hours prior to discharge.  Patient denies having homicidal thoughts.  Patient denies having auditory hallucinations.  Patient denies any visual hallucinations or other symptoms of psychosis. The patient was motivated to continue taking medication with a goal of continued improvement in mental health.    The patient reports their target psychiatric symptoms of depression, anxiety & psychosis responded well to the psychiatric medications, and the patient reports overall benefit from this psychiatric hospitalization. Supportive psychotherapy was provided to the patient. The patient also participated in regular group therapy while hospitalized. Coping skills, problem solving as well as relaxation therapies were also part of the unit programming.   Labs were reviewed with the patient, and abnormal results were discussed with the patient.   The patient is able to verbalize their individual safety plan to this provider.   # It is recommended to the patient to continue psychiatric medications as prescribed, after discharge from the hospital.     # It is recommended to the patient to follow up with your outpatient psychiatric provider and PCP.   # It was discussed with the patient, the impact of alcohol, drugs, tobacco have been there overall psychiatric and medical wellbeing, and total abstinence from substance use was recommended the patient.ed.   # Prescriptions provided or sent directly to preferred pharmacy at discharge. Patient agreeable to plan. Given opportunity to ask questions. Appears to feel comfortable with discharge.    # In the event of worsening symptoms, the patient is instructed to call the crisis hotline, 911 and or go to the nearest ED for appropriate evaluation and treatment of symptoms. To follow-up with primary care provider for other medical issues, concerns and or health care  needs   # Patient was discharged home with a plan to follow up as noted below.    Total Time spent with patient: 45 minutes  Physical Findings: AIMS: Facial and Oral Movements Muscles of Facial Expression: None, normal Lips and Perioral Area: None, normal Jaw: None, normal Tongue: None, normal,Extremity Movements Upper (arms, wrists, hands, fingers): None, normal Lower (legs, knees, ankles, toes): None, normal, Trunk Movements Neck, shoulders, hips: None, normal, Overall Severity Severity of abnormal movements (highest score from questions above): None, normal Incapacitation due to abnormal movements:  None, normal Patient's awareness of abnormal movements (rate only patient's report): No Awareness, Dental Status Current problems with teeth and/or dentures?: No Does patient usually wear dentures?: No  CIWA:  n/a COWS: n/a   Musculoskeletal: Strength & Muscle Tone: within normal limits Gait & Station: normal Patient leans: N/A  Psychiatric Specialty Exam:  Presentation  General Appearance:  Appropriate for Environment; Fairly Groomed  Eye Contact: Good  Speech: Clear and Coherent  Speech Volume: Normal  Handedness: Right   Mood and Affect  Mood: Euthymic  Affect: Congruent; Appropriate   Thought Process  Thought Processes: Coherent  Descriptions of Associations:Intact  Orientation:Full (Time, Place and Person)  Thought Content:Logical  History of Schizophrenia/Schizoaffective disorder:Yes  Duration of Psychotic Symptoms:Greater than six months  Hallucinations:Hallucinations: None Description of Auditory Hallucinations: Patient denies  Ideas of Reference:None  Suicidal Thoughts:Suicidal Thoughts: No SI Active Intent and/or Plan: -- (Patient denies) SI Passive Intent and/or Plan: -- (Patient denies)  Homicidal Thoughts:Homicidal Thoughts: No   Sensorium  Memory: Immediate Good  Judgment: Fair  Insight: Fair   Art therapist   Concentration: Fair  Attention Span: Fair  Recall: [employer] of Knowledge: Fair  Language: Fair   Psychomotor Activity  Psychomotor Activity: Psychomotor Activity: Normal   Assets  Assets: Communication Skills   Sleep  Sleep: Sleep: Good    Physical Exam: Physical Exam Review of Systems  Constitutional:  Negative for fever.  HENT:  Negative for hearing loss.   Eyes:  Negative for blurred vision.  Respiratory:  Negative for cough.   Cardiovascular:  Negative for chest pain.  Genitourinary:  Negative for dysuria.  Musculoskeletal:  Negative for myalgias.  Skin:  Negative for rash.  Neurological:  Negative for dizziness.  Psychiatric/Behavioral:  Positive for depression (Denies SI/HI/AVH, verbally contracts for safety outside of [facility]). Negative for hallucinations, memory loss, substance abuse and suicidal ideas. The patient is nervous/anxious (Resolving) and has insomnia (REsolving).    Blood pressure 120/69, pulse (!) 55, temperature 98 F (36.7 C), temperature source Oral, resp. rate 18, height 5\' 5"  (1.651 m), weight 94.3 kg, last menstrual period 04/25/2022, SpO2 100 %. Body mass index is 34.61 kg/m.   Social History   Tobacco Use  Smoking Status Former   Packs/day: 0   Types: Cigarettes  Smokeless Tobacco Never  Tobacco Comments   Smoked for 2  years age [age]   Tobacco Cessation:  N/A, patient does not currently use tobacco products   Blood Alcohol level:  Lab Results  Component Value Date   ETH <10 06/04/2022   ETH <10 05/25/2022    Metabolic Disorder Labs:  Lab Results  Component Value Date   HGBA1C 5.2 04/20/2022   MPG 103 04/20/2022   MPG 96.8 10/07/2021   Lab Results  Component Value Date   PROLACTIN 66.2 (H) 03/02/2021   PROLACTIN 66.1 (H) 02/04/2020   Lab Results  Component Value Date   CHOL 191 04/20/2022   TRIG 163 (H) 04/20/2022   HDL 52 04/20/2022   CHOLHDL 3.7 04/20/2022   VLDL 33 04/20/2022   LDLCALC 106  (H) 04/20/2022   LDLCALC 102 (H) 12/10/2021    See Psychiatric Specialty Exam and Suicide Risk Assessment completed by Attending Physician prior to discharge.  Discharge destination:  Home  Is patient on multiple antipsychotic therapies at discharge:  No   Has Patient had three or more failed trials of antipsychotic monotherapy by history:  No  Recommended Plan for Multiple Antipsychotic Therapies: NA   Allergies  as of 06/11/2022       Reactions   Bee Venom Anaphylaxis   Coconut Flavor Anaphylaxis, Rash   Fish Allergy Anaphylaxis   Geodon [ziprasidone Hcl] Other (See Comments)   Pt states that this medication causes paralysis of the mouth.     Haloperidol And Related Other (See Comments)   Pt states that this medication causes paralysis of the mouth, jaw locks up   Lithobid [lithium] Other (See Comments)   Seizure-like activity   Roxicodone [oxycodone] Other (See Comments)   Hallucinations    Seroquel [quetiapine] Other (See Comments)   Severe drowsiness Pt currently taking 12.5 mg BID, she is ok with this dose   Shellfish Allergy Anaphylaxis   Phenergan [promethazine Hcl] Other (See Comments)   Chest pain    Prilosec [omeprazole] Nausea And Vomiting   Pt can take protonix with no problems   Sulfa Antibiotics Other (See Comments)   Chest pain    Tegretol [carbamazepine] Nausea And Vomiting   Prozac [fluoxetine] Other (See Comments)   Increased Depression Suicidal thoughts   Tape Other (See Comments)   Skin tears, can only tolerate paper tape.   Tylenol [acetaminophen] Rash, Other (See Comments)   Rash on face         Medication List     STOP taking these medications    ibuprofen 200 MG tablet Commonly known as: ADVIL   lidocaine 4 % Commonly known as: HM Lidocaine Patch   Melatonin 3 MG Caps Replaced by: melatonin 3 MG Tabs tablet   metoprolol succinate 25 MG 24 hr tablet Commonly known as: TOPROL-XL   OLANZapine 5 MG tablet Commonly known as:  ZYPREXA   VITAMIN B-12 PO       TAKE these medications      Indication  albuterol 108 (90 Base) MCG/ACT inhaler Commonly known as: VENTOLIN HFA Inhale 2 puffs into the lungs every 6 (six) hours as needed for wheezing or shortness of breath.  Indication: Asthma   Breyna 160-4.5 MCG/ACT inhaler Generic drug: budesonide-formoterol Inhale 1 puff into the lungs in the morning and at bedtime.  Indication: Asthma   cariprazine 1.5 MG capsule Commonly known as: VRAYLAR Take 1 capsule (1.5 mg total) by mouth daily. Start taking on: Jun 12, 2022 What changed:  medication strength how much to take when to take this  Indication: mood stabilization   Corlanor 5 MG Tabs tablet Generic drug: ivabradine Take 5 mg by mouth 2 (two) times daily.  Indication: Heart Rate Greater Than 100 BPM without Apparent Cause   cyclobenzaprine 10 MG tablet Commonly known as: FLEXERIL Take 10 mg by mouth at bedtime.  Indication: Fibromyalgia Syndrome   diclofenac Sodium 1 % Gel Commonly known as: VOLTAREN Apply 2 g topically 2 (two) times daily.  Indication: Joint Damage causing Pain and Loss of Function   escitalopram 10 MG tablet Commonly known as: LEXAPRO Take 1 tablet (10 mg total) by mouth daily for 14 days.  Indication: Major Depressive Disorder   hydrOXYzine 25 MG tablet Commonly known as: ATARAX Take 1 tablet (25 mg total) by mouth 3 (three) times daily as needed for anxiety.  Indication: Feeling Anxious   melatonin 3 MG Tabs tablet Take 1 tablet (3 mg total) by mouth at bedtime. Replaces: Melatonin 3 MG Caps  Indication: Trouble Sleeping   pantoprazole 40 MG tablet Commonly known as: PROTONIX Take 1 tablet (40 mg total) by mouth daily for 14 days. What changed: when to take this  Indication: Gastroesophageal  Reflux Disease   prazosin 1 MG capsule Commonly known as: MINIPRESS Take 1 capsule (1 mg total) by mouth at bedtime for 14 days.  Indication: Frightening Dreams    pregabalin 100 MG capsule Commonly known as: LYRICA Take 100 mg by mouth 3 (three) times daily.  Indication: Generalized Anxiety Disorder, Neuropathic Pain   traZODone 50 MG tablet Commonly known as: DESYREL Take 1 tablet (50 mg total) by mouth at bedtime as needed for up to 7 days for sleep. What changed: when to take this  Indication: Trouble Sleeping        Follow-up Information     [facility] Follow up.   Why: Please follow up with your CST team after discharge. However, I already spoke with Lupita Leash who stated that she will see you tomorrow Contact information: [address]  [address] [place] [address] [phone]                Signed: Starleen Blue, NP 06/11/2022, 12:52 PM

## 2022-06-11 NOTE — Progress Notes (Signed)
Pt discharged to lobby. Pt was stable and appreciative at that time. All papers and prescriptions were given and valuables returned. Verbal understanding expressed. Denies SI/HI and A/VH. Pt given opportunity to express concerns and ask questions.  

## 2022-06-11 NOTE — [facility] Suicide Risk Assessment (Signed)
Suicide Risk Assessment  Discharge Assessment    [facility] Discharge Suicide Risk Assessment   Principal Problem: Schizoaffective disorder, bipolar type [facility]) Discharge Diagnoses: Principal Problem:   Schizoaffective disorder, bipolar type (HCC) Active Problems:   PTSD (post-traumatic stress disorder)   Asthma   Fibromyalgia   Insomnia  Reason for admission: [name]. [name] is a 32 yo CF with past mental health diagnoses of PTSD, borderline personality d/o & schizoaffective d/o who was transported to the [facility] ED on 4/29 after she called 911 to report that she had taken an overdose of a weeks' worth of her medications. Pt reported on arrival to the ED that she took the following: 7 Trazodone, 7 Lexapro, 9 Vraylar, 7 Protonix, 7 flexeril, 25 Vistaril, 7 Zyprexa, 7 Prazosins, 14 Metoprolol in a suicide attempt." Pt was medically treated & stabilized prior to being transferred to this [facility] voluntarily for treatment and stabilization of her mental status.   Hospital Course: During the patient's hospitalization, patient had extensive initial psychiatric evaluation, and follow-up psychiatric evaluations every day. Psychiatric diagnoses provided upon initial assessment are, listed as above.  Patient's psychiatric medications were adjusted on admission as listed below: -Started Vraylar 1.5 mg on 5/5 (delay d/t long half life)-Od'd on med -Continued Lexapro 10 mg daily for depressive symptoms -Started Hydroxyzine 25 mg TID PRN for anxiety -Stopped Scheduled Zyprexa -Started Trazodone 50 prn nightly for sleep -Started Melatonin 3 mg nightly for sleep -Stopped Toprol 25 mg daily for tachycardia -Started Corlanor 5 mg tabs for tachycardia BID (home medication) -Continued Prazosin 1 mg nightly for nightmares -Continued Lyrica 100mg  TID for pain -Continued Dulera inhaler for asthma -Started albuterol inhaler for asthma prn -Started Neosporin BID to left arm cut  During the hospitalization, other  adjustments were made to the patient's psychiatric medication regimen. Medications at discharge are:  #Schizoaffective disorder (bipolar type) -Continue current dose of Vraylar 1.5 mg daily for psychosis and mood stabilization-Outpatient mental health provider to titrate medication up as needed to reach prior to admission dose of 4.5 mg/day. This prescription has been sent into pt's pharmacy along with Voltaren, Hydroxyzine and Melatonin. Pt states that she has refills of her other medications, and has already called her pharmacy to have those refills ready at her pharmacy for pick up. She has been educated that only her CST team is allowed to pick her medications at the pharmacy due to her risk of her overdose as evidenced by a history of repeated overdoses in the past. She has verbalized understanding.  Labs: Lipid panel on 3/15 with triglycerides slightly elevated at 163 otherwise, WNL. TSH on same date WNL. HA1C WNL. EKG is slightly elevated at 464. Pt educated on the need to repeat EKG for trending on an outpatient basis. She is being discharged today prior to one being completed since she signed her 72 hr notice for discharge and it expires tomorrow morning at 0600.   -Continue Lexapro 10 mg daily for depressive symptoms   #PTSD -Continue Lexapro 10 mg daily for PTSD -Continue prazosin 1 mg at bedtime for PTSD   #Asthma -Continue albuterol 2 puffs, inhalation, every 4 hours as needed for wheezing and shortness of breath -Continue mometasone-formoterol 2 puffs, inhalation, 2 times daily   #Fibromyalgia -Continue pregabalin 100 mg 3 times daily for fibromyalgia  -Continue cyclobenzaprine 10 mg at bedtime for fibromyalgia   #Insomnia -Continue trazodone 50 mg at bedtime as needed for sleep -Continue melatonin 3 mg daily at bedtime for sleep  Patient's care was discussed during the interdisciplinary team meeting every day during the hospitalization. The patient denies having side  effects to prescribed psychiatric medication. Gradually, patient started adjusting to milieu. The patient was evaluated each day by a clinical provider to ascertain response to treatment. Improvement was noted by the patient's report of decreasing symptoms, improved sleep and appetite, affect, medication tolerance, behavior, and participation in unit programming.  Patient was asked each day to complete a self inventory noting mood, mental status, pain, new symptoms, anxiety and concerns. Symptoms were reported as significantly decreased or resolved completely by discharge.   On day of discharge, the patient reports that their mood is stable. The patient denied having suicidal thoughts for more than 48 hours prior to discharge.  Patient denies having homicidal thoughts.  Patient denies having auditory hallucinations.  Patient denies any visual hallucinations or other symptoms of psychosis. The patient was motivated to continue taking medication with a goal of continued improvement in mental health.   The patient reports their target psychiatric symptoms of depression, anxiety & psychosis responded well to the psychiatric medications, and the patient reports overall benefit from this psychiatric hospitalization. Supportive psychotherapy was provided to the patient. The patient also participated in regular group therapy while hospitalized. Coping skills, problem solving as well as relaxation therapies were also part of the unit programming.  Labs were reviewed with the patient, and abnormal results were discussed with the patient.  The patient is able to verbalize their individual safety plan to this provider.  # It is recommended to the patient to continue psychiatric medications as prescribed, after discharge from the hospital.    # It is recommended to the patient to follow up with your outpatient psychiatric provider and PCP.  # It was discussed with the patient, the impact of alcohol, drugs, tobacco  have been there overall psychiatric and medical wellbeing, and total abstinence from substance use was recommended the patient.ed.  # Prescriptions provided or sent directly to preferred pharmacy at discharge. Patient agreeable to plan. Given opportunity to ask questions. Appears to feel comfortable with discharge.    # In the event of worsening symptoms, the patient is instructed to call the crisis hotline, 911 and or go to the nearest ED for appropriate evaluation and treatment of symptoms. To follow-up with primary care provider for other medical issues, concerns and or health care needs  # Patient was discharged home with a plan to follow up as noted below.   Total Time spent with patient: 45 minutes  Musculoskeletal: Strength & Muscle Tone: within normal limits Gait & Station: normal Patient leans: N/A  Psychiatric Specialty Exam  Presentation  General Appearance:  Appropriate for Environment; Fairly Groomed  Eye Contact: Good  Speech: Clear and Coherent  Speech Volume: Normal  Handedness: Right   Mood and Affect  Mood: Euthymic  Duration of Depression Symptoms: Greater than two weeks  Affect: Congruent   Thought Process  Thought Processes: Coherent  Descriptions of Associations:Intact  Orientation:Full (Time, Place and Person)  Thought Content:WDL  History of Schizophrenia/Schizoaffective disorder:Yes  Duration of Psychotic Symptoms:Greater than six months  Hallucinations:Hallucinations: None Description of Auditory Hallucinations: Patient denies  Ideas of Reference:None  Suicidal Thoughts:Suicidal Thoughts: No SI Active Intent and/or Plan: -- (Patient denies) SI Passive Intent and/or Plan: -- (Patient denies)  Homicidal Thoughts:Homicidal Thoughts: No   Sensorium  Memory: Immediate Good  Judgment: Fair  Insight: Fair   Art therapist  Concentration: Fair  Attention Span: Fair  Recall:  Fair  [employer] of  Knowledge: Fair  Language: Fair   Lexicographer Activity: Psychomotor Activity: Normal   Assets  Assets: Resilience; Social Support; Housing   Sleep  Sleep: Sleep: Good   Physical Exam: Physical Exam Constitutional:      Appearance: Normal appearance.  HENT:     Head: Normocephalic.     Nose: Nose normal. No congestion.  Eyes:     Pupils: Pupils are equal, round, and reactive to light.  Pulmonary:     Effort: Pulmonary effort is normal.  Musculoskeletal:        General: Normal range of motion.  Skin:    General: Skin is warm.  Neurological:     Mental Status: She is alert and oriented to person, place, and time.    Review of Systems  Constitutional: Negative.   HENT: Negative.    Eyes:  Negative for blurred vision.  Respiratory: Negative.  Negative for cough.   Cardiovascular: Negative.  Negative for chest pain.  Skin: Negative.   Psychiatric/Behavioral:  Positive for depression (Denies SI/HI/AVH, and verbally contracts for safety outside of this [facility]). Negative for hallucinations, memory loss, substance abuse and suicidal ideas. The patient is nervous/anxious (resolving on current meds) and has insomnia (resolving on current meds).    Blood pressure 120/69, pulse (!) 55, temperature 98 F (36.7 C), temperature source Oral, resp. rate 18, height 5\' 5"  (1.651 m), weight 94.3 kg, last menstrual period 04/25/2022, SpO2 100 %. Body mass index is 34.61 kg/m.  Mental Status Per Nursing Assessment::   On Admission:  NA  Demographic Factors:  Caucasian and Low socioeconomic status  Loss Factors: NA  Historical Factors: Impulsivity  Risk Reduction Factors:   Responsible for children under 22 years of age and Employed  Continued Clinical Symptoms:  More than one psychiatric diagnosis  Cognitive Features That Contribute To Risk:  None    Suicide Risk:  Mild:  There are no identifiable suicide plans, no associated intent, mild  dysphoria and related symptoms, good self-control (both objective and subjective assessment), few other risk factors, and identifiable protective factors, including available and accessible social support.    Follow-up Information     [facility] Follow up.   Why: Please follow up with your CST team after discharge. However, I already spoke with Lupita Leash who stated that she will see you tomorrow Contact information: [address]  [address] [place] [address] [phone]                Starleen Blue, NP 06/11/2022, 10:37 AM

## 2022-06-11 NOTE — Progress Notes (Signed)
[facility] LCSW Progress Note   06/11/2022    10:34 AM  [name]      Type of Note: Medicare Notice Given    Patient informed of right to appeal discharge, provided phone number to [facility]. Patient expressed no interest in appealing discharge at this time. CSW will continue to monitor situation.     Signed:   Jacob Moores, MSW, [facility] 06/11/2022 10:34 AM

## 2022-06-11 NOTE — [facility] Suicide Risk Assessment (Signed)
[facility] INPATIENT:  Family/Significant Other Suicide Prevention Education  Suicide Prevention Education:  Education Completed; Lupita Leash [facility])- [phone],  (name of family member/significant other) has been identified by the patient as the family member/significant other with whom the patient will be residing, and identified as the person(s) who will aid the patient in the event of a mental health crisis (suicidal ideations/suicide attempt).  With written consent from the patient, the family member/significant other has been provided the following suicide prevention education, prior to the and/or following the discharge of the patient.   CSW spoke with patient CST Lupita Leash) and completed safety planning. Lupita Leash stated that patient considers the hospital as her safe place because she gets to be around people. CSW asked Lupita Leash about how they will be able to better monitor patient medications and Lupita Leash stated that they give patient only 7-days at a time for the week  and checks on her 3x a week. Lupita Leash did state that [employer] [employer] has provided numerous coping skills to help patient deal with her daily mental health needs. Lupita Leash denies pt having access to guns or weapons but states that she has sharpe utensil; Lupita Leash states that they try to closely watch patient but it can be hard with caseload.   The suicide prevention education provided includes the following: Suicide risk factors Suicide prevention and interventions National Suicide Hotline telephone number [facility] assessment telephone number [facility] Emergency Assistance 911 [facility] and/or Residential Mobile Crisis Unit telephone number  Request made of family/significant other to: Remove weapons (e.g., guns, rifles, knives), all items previously/currently identified as safety concern.   Remove drugs/medications (over-the-counter, prescriptions, illicit drugs), all items previously/currently identified as a safety concern.  The  family member/significant other verbalizes understanding of the suicide prevention education information provided.  The family member/significant other agrees to remove the items of safety concern listed above.  [name] 06/11/2022, 9:33 AM

## 2022-06-11 NOTE — Progress Notes (Signed)
  [facility] Adult Case Management Discharge Plan :  Will you be returning to the same living situation after discharge:  Yes,  patient will be returning back to her apartment  At discharge, do you have transportation home?: No.CSW is arranging a Taxi for patient  Do you have the ability to pay for your medications: Yes,  Pt has [facility] Medicare   Release of information consent forms completed and in the chart;  Patient's signature needed at discharge.  Patient to Follow up at:  Follow-up Information     [facility] Follow up.   Why: Please follow up with your CST team after discharge. However, I already spoke with Lupita Leash who stated that she will see you tomorrow Contact information: [address]  [address] [place] [address] [phone]                 Next level of care provider has access to [facility] Link:no  Safety Planning and Suicide Prevention discussed: Yes,  [place])- [phone]     Has patient been referred to the Quitline?: Patient refused referral  Patient has been referred for addiction treatment: N/A. Patient does not have any addiction to need an referral   Beather Arbour 06/11/2022, 10:33 AM

## 2022-06-16 ENCOUNTER — Other Ambulatory Visit: Payer: Self-pay

## 2022-06-16 ENCOUNTER — Encounter (HOSPITAL_COMMUNITY): Payer: Self-pay

## 2022-06-16 ENCOUNTER — Emergency Department (HOSPITAL_COMMUNITY)
Admission: EM | Admit: 2022-06-16 | Discharge: 2022-06-18 | Disposition: A | Discharge status: Other Acute Inpatient Hospital | Payer: Medicare PPO | Attending: Emergency Medicine | Admitting: Emergency Medicine

## 2022-06-16 DIAGNOSIS — R0602 Shortness of breath: Secondary | ICD-10-CM | POA: Insufficient documentation

## 2022-06-16 DIAGNOSIS — R45851 Suicidal ideations: Secondary | ICD-10-CM | POA: Insufficient documentation

## 2022-06-16 DIAGNOSIS — S51812A Laceration without foreign body of left forearm, initial encounter: Secondary | ICD-10-CM | POA: Diagnosis not present

## 2022-06-16 DIAGNOSIS — F32A Depression, unspecified: Secondary | ICD-10-CM | POA: Diagnosis not present

## 2022-06-16 DIAGNOSIS — Z7289 Other problems related to lifestyle: Secondary | ICD-10-CM

## 2022-06-16 DIAGNOSIS — F418 Other specified anxiety disorders: Secondary | ICD-10-CM | POA: Insufficient documentation

## 2022-06-16 DIAGNOSIS — X788XXA Intentional self-harm by other sharp object, initial encounter: Secondary | ICD-10-CM | POA: Diagnosis not present

## 2022-06-16 DIAGNOSIS — R062 Wheezing: Secondary | ICD-10-CM | POA: Insufficient documentation

## 2022-06-16 DIAGNOSIS — S59912A Unspecified injury of left forearm, initial encounter: Secondary | ICD-10-CM | POA: Diagnosis present

## 2022-06-16 LAB — CBC
HCT: 33.5 % — ABNORMAL LOW (ref 36.0–46.0)
Hemoglobin: 10.3 g/dL — ABNORMAL LOW (ref 12.0–15.0)
MCH: 24.9 pg — ABNORMAL LOW (ref 26.0–34.0)
MCHC: 30.7 g/dL (ref 30.0–36.0)
MCV: 80.9 fL (ref 80.0–100.0)
Platelets: 276 10*3/uL (ref 150–400)
RBC: 4.14 MIL/uL (ref 3.87–5.11)
RDW: 14.4 % (ref 11.5–15.5)
WBC: 10.6 10*3/uL — ABNORMAL HIGH (ref 4.0–10.5)
nRBC: 0 % (ref 0.0–0.2)

## 2022-06-16 LAB — COMPREHENSIVE METABOLIC PANEL
ALT: 18 U/L (ref 0–44)
AST: 18 U/L (ref 15–41)
Albumin: 3.7 g/dL (ref 3.5–5.0)
Alkaline Phosphatase: 59 U/L (ref 38–126)
Anion gap: 9 (ref 5–15)
BUN: 10 mg/dL (ref 6–20)
CO2: 21 mmol/L — ABNORMAL LOW (ref 22–32)
Calcium: 8.7 mg/dL — ABNORMAL LOW (ref 8.9–10.3)
Chloride: 109 mmol/L (ref 98–111)
Creatinine, Ser: 0.71 mg/dL (ref 0.44–1.00)
GFR, Estimated: >60 mL/min (ref >60)
Glucose, Bld: 90 mg/dL (ref 70–99)
Potassium: 3.5 mmol/L (ref 3.5–5.1)
Sodium: 139 mmol/L (ref 135–145)
Total Bilirubin: 0.4 mg/dL (ref 0.3–1.2)
Total Protein: 6.8 g/dL (ref 6.5–8.1)

## 2022-06-16 LAB — ACETAMINOPHEN LEVEL: Acetaminophen (Tylenol), Serum: <10 ug/mL — ABNORMAL LOW (ref 10–30)

## 2022-06-16 LAB — I-STAT BETA HCG BLOOD, ED (MC, WL, AP ONLY): I-stat hCG, quantitative: <5 m[IU]/mL (ref <5)

## 2022-06-16 LAB — SALICYLATE LEVEL: Salicylate Lvl: <7 mg/dL — ABNORMAL LOW (ref 7.0–30.0)

## 2022-06-16 LAB — ETHANOL: Alcohol, Ethyl (B): <10 mg/dL (ref <10)

## 2022-06-16 MED ORDER — MOMETASONE FURO-FORMOTEROL FUM 200-5 MCG/ACT IN AERO
2.0000 | INHALATION_SPRAY | Freq: Two times a day (BID) | RESPIRATORY_TRACT | Status: DC
  Administered 2022-06-17 (×2): 2 via RESPIRATORY_TRACT
  Filled 2022-06-16 (×2): qty 8.8

## 2022-06-16 MED ORDER — MELATONIN 3 MG PO TABS
3.0000 mg | ORAL_TABLET | Freq: Every day | ORAL | Status: DC
  Administered 2022-06-17: 3 mg via ORAL
  Filled 2022-06-16 (×2): qty 1

## 2022-06-16 MED ORDER — IVABRADINE HCL 5 MG PO TABS
5.0000 mg | ORAL_TABLET | Freq: Two times a day (BID) | ORAL | Status: DC
  Administered 2022-06-17 – 2022-06-18 (×3): 5 mg via ORAL
  Filled 2022-06-16 (×4): qty 1

## 2022-06-16 MED ORDER — HYDROXYZINE HCL 25 MG PO TABS: 25.0000 mg | ORAL_TABLET | Freq: Three times a day (TID) | ORAL | Status: DC | PRN

## 2022-06-16 MED ORDER — ALBUTEROL SULFATE HFA 108 (90 BASE) MCG/ACT IN AERS: 2.0000 | INHALATION_SPRAY | Freq: Four times a day (QID) | RESPIRATORY_TRACT | Status: DC | PRN

## 2022-06-16 MED ORDER — CARIPRAZINE HCL 1.5 MG PO CAPS
1.5000 mg | ORAL_CAPSULE | Freq: Every day | ORAL | Status: DC
  Administered 2022-06-17 – 2022-06-18 (×2): 1.5 mg via ORAL
  Filled 2022-06-16 (×3): qty 1

## 2022-06-16 MED ORDER — ESCITALOPRAM OXALATE 10 MG PO TABS
10.0000 mg | ORAL_TABLET | Freq: Every day | ORAL | Status: DC
  Administered 2022-06-17: 10 mg via ORAL
  Filled 2022-06-16 (×2): qty 1

## 2022-06-16 MED ORDER — CYCLOBENZAPRINE HCL 10 MG PO TABS
10.0000 mg | ORAL_TABLET | Freq: Every day | ORAL | Status: DC
  Administered 2022-06-17 (×2): 10 mg via ORAL
  Filled 2022-06-16 (×2): qty 1

## 2022-06-16 MED ORDER — PREGABALIN 50 MG PO CAPS
100.0000 mg | ORAL_CAPSULE | Freq: Three times a day (TID) | ORAL | Status: DC
  Administered 2022-06-17 (×3): 100 mg via ORAL
  Filled 2022-06-16 (×5): qty 2

## 2022-06-16 MED ORDER — TRAZODONE HCL 50 MG PO TABS
50.0000 mg | ORAL_TABLET | Freq: Every evening | ORAL | Status: DC | PRN
  Administered 2022-06-17: 50 mg via ORAL
  Filled 2022-06-16: qty 1

## 2022-06-16 NOTE — ED Triage Notes (Signed)
Pt. BIB [facility] for SI pt. Has hx of SI and self harm. Pt. Has 2 superficial lacerations to the L Forearm that were self inflicted. Pt.'s trigger was mother's day being tomorrow.

## 2022-06-16 NOTE — ED Provider Notes (Signed)
[place] EMERGENCY DEPARTMENT AT [facility] Provider Note   CSN: [id number] Arrival date & time: 06/16/22  2148     History  Chief Complaint  Patient presents with   Suicidal    [name] is a 32 y.o. female.  HPI   Patient has a history of suicidal ideation and intentional self-harm.  She has been in the ED 31 times in the last 6 months.  Patient states this evening she was getting stressed out about Mother's Day.  She was feeling anxious.  Patient started to cut her arm to help relieve stress.  She states she used a razor blade.  She denies thoughts of trying to kill herself.  His tetanus shot is up-to-date  Home Medications Prior to Admission medications   Medication Sig Start Date End Date Taking? Authorizing Provider  albuterol (VENTOLIN HFA) 108 (90 Base) MCG/ACT inhaler Inhale 2 puffs into the lungs every 6 (six) hours as needed for wheezing or shortness of breath.    [provider]  BREYNA 160-4.5 MCG/ACT inhaler Inhale 1 puff into the lungs in the morning and at bedtime. 04/18/22   [provider]  cariprazine (VRAYLAR) 1.5 MG capsule Take 1 capsule (1.5 mg total) by mouth daily. 06/12/22   Nkwenti, Tyler Aas, NP  CORLANOR 5 MG TABS tablet Take 5 mg by mouth 2 (two) times daily. 05/14/22   [provider]  cyclobenzaprine (FLEXERIL) 10 MG tablet Take 10 mg by mouth at bedtime. 05/10/22   [provider]  diclofenac Sodium (VOLTAREN) 1 % GEL Apply 2 g topically 2 (two) times daily. 06/11/22   Starleen Blue, NP  escitalopram (LEXAPRO) 10 MG tablet Take 1 tablet (10 mg total) by mouth daily for 14 days. 02/07/22 06/06/22  Massengill, Harrold Donath, MD  hydrOXYzine (ATARAX) 25 MG tablet Take 1 tablet (25 mg total) by mouth 3 (three) times daily as needed for anxiety. 06/11/22   Starleen Blue, NP  melatonin 3 MG TABS tablet Take 1 tablet (3 mg total) by mouth at bedtime. 06/11/22   Starleen Blue, NP  pantoprazole (PROTONIX) 40 MG tablet Take  1 tablet (40 mg total) by mouth daily for 14 days. Patient taking differently: Take 40 mg by mouth daily before breakfast. 02/07/22 06/06/22  Massengill, Harrold Donath, MD  prazosin (MINIPRESS) 1 MG capsule Take 1 capsule (1 mg total) by mouth at bedtime for 14 days. 02/06/22 06/06/22  Massengill, Harrold Donath, MD  pregabalin (LYRICA) 100 MG capsule Take 100 mg by mouth 3 (three) times daily.    [provider]  traZODone (DESYREL) 50 MG tablet Take 1 tablet (50 mg total) by mouth at bedtime as needed for up to 7 days for sleep. Patient taking differently: Take 50 mg by mouth at bedtime. 04/30/22 06/06/22  Massengill, Harrold Donath, MD      Allergies    Bee venom, Coconut flavor, Fish allergy, Geodon [ziprasidone hcl], Haloperidol and related, Lithobid [lithium], Roxicodone [oxycodone], Seroquel [quetiapine], Shellfish allergy, Phenergan [promethazine hcl], Prilosec [omeprazole], Sulfa antibiotics, Tegretol [carbamazepine], Prozac [fluoxetine], Tape, and Tylenol [acetaminophen]    Review of Systems   Review of Systems  Physical Exam Updated Vital Signs BP 119/69   Pulse 96   Temp 98.4 F (36.9 C)   Resp 19   LMP 04/25/2022 (Approximate) Comment: per pt, not pregnant  SpO2 99%  Physical Exam Vitals and nursing note reviewed.  Constitutional:      General: She is not in acute distress.    Appearance: She is well-developed.  HENT:     Head: Normocephalic and atraumatic.     Right Ear: External ear normal.     Left Ear: External ear normal.  Eyes:     General: No scleral icterus.       Right eye: No discharge.        Left eye: No discharge.     Conjunctiva/sclera: Conjunctivae normal.  Neck:     Trachea: No tracheal deviation.  Cardiovascular:     Rate and Rhythm: Normal rate.  Pulmonary:     Effort: Pulmonary effort is normal. No respiratory distress.     Breath sounds: No stridor.  Abdominal:     General: There is no distension.  Musculoskeletal:        General: No swelling or deformity.      Cervical back: Neck supple.     Comments: Multiple superficial horizontal lacerations in the left forearm, none penetrate through the dermis, most are just scratches a couple have small amount of blood the wound surface  Skin:    General: Skin is warm and dry.     Findings: No rash.  Neurological:     Mental Status: She is alert. Mental status is at baseline.     Cranial Nerves: No dysarthria or facial asymmetry.     Motor: No seizure activity.  Psychiatric:        Mood and Affect: Affect is flat.        Speech: Speech is not delayed or tangential.        Behavior: Behavior is not aggressive or hyperactive.        Thought Content: Thought content does not include homicidal or suicidal ideation.     ED Results / Procedures / Treatments   Labs (all labs ordered are listed, but only abnormal results are displayed) Labs Reviewed  COMPREHENSIVE METABOLIC PANEL - Abnormal; Notable for the following components:      Result Value   CO2 21 (*)    Calcium 8.7 (*)    All other components within normal limits  SALICYLATE LEVEL - Abnormal; Notable for the following components:   Salicylate Lvl <7.0 (*)    All other components within normal limits  ACETAMINOPHEN LEVEL - Abnormal; Notable for the following components:   Acetaminophen (Tylenol), Serum <10 (*)    All other components within normal limits  CBC - Abnormal; Notable for the following components:   WBC 10.6 (*)    Hemoglobin 10.3 (*)    HCT 33.5 (*)    MCH 24.9 (*)    All other components within normal limits  ETHANOL  RAPID URINE DRUG SCREEN, HOSP PERFORMED  I-STAT BETA HCG BLOOD, ED ([facility], [facility], [facility] ONLY)    EKG None  Radiology No results found.  Procedures Procedures   Wound was irrigated with tap water.  Nonadhesive dressing applied Medications Ordered in ED Medications - No data to display  ED Course/ Medical Decision Making/ A&P Clinical Course as of 06/16/22 2333  Sat Jun 16, 2022  2327 Labs reviewed.  CBC  metabolic panel normal. [JK]  2327 Acetaminophen salicylate level normal [JK]    Clinical Course User Index [JK] Linwood Dibbles, MD                             Medical Decision Making Problems Addressed: Deliberate self-cutting: acute illness or injury Depression, unspecified depression type: chronic illness or injury with exacerbation, progression, or side effects of treatment  Amount and/or Complexity of Data Reviewed Labs: ordered. Decision-making details documented in ED Course.   Patient presents ED for evaluation of depression and self-harm.  Patient initially mentioned suicidal ideation to EMS although she denies that to me at this time.  Patient states she was trying to relieve stress but did not intend to kill herself.  Patient does have history of chronic mental health problems with frequent visits to the ED.  Will ask CTS to consult.  Patient otherwise is medically cleared  The patient has been placed in psychiatric observation due to the need to provide a safe environment for the patient while obtaining psychiatric consultation and evaluation, as well as ongoing medical and medication management to treat the patient's condition.  The patient has not been placed under full IVC at this time.         Final Clinical Impression(s) / ED Diagnoses Final diagnoses:  Depression, unspecified depression type  Deliberate self-cutting    Rx / DC Orders ED Discharge Orders     None         Linwood Dibbles, MD 06/16/22 2333

## 2022-06-17 DIAGNOSIS — S51812A Laceration without foreign body of left forearm, initial encounter: Secondary | ICD-10-CM | POA: Diagnosis not present

## 2022-06-17 LAB — RAPID URINE DRUG SCREEN, HOSP PERFORMED
Amphetamines: NOT DETECTED
Barbiturates: NOT DETECTED
Benzodiazepines: NOT DETECTED
Cocaine: NOT DETECTED
Opiates: NOT DETECTED
Tetrahydrocannabinol: NOT DETECTED

## 2022-06-17 NOTE — ED Provider Notes (Signed)
Emergency Medicine Observation Re-evaluation Note  [name] is a 32 y.o. female, seen on rounds today.  Pt initially presented to the ED for complaints of Suicidal Currently, the patient is resting.  Physical Exam  BP 119/69   Pulse 96   Temp 98.4 F (36.9 C)   Resp 19   LMP 04/25/2022 (Approximate) Comment: per pt, not pregnant  SpO2 99%  Physical Exam General: NAD   ED Course / MDM  EKG:   I have reviewed the labs performed to date as well as medications administered while in observation.  Recent changes in the last 24 hours include no acute events reported.  Plan  Current plan is for placement.    Wynetta Fines, MD 06/17/22 [phone]
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
## 2022-06-17 NOTE — BH Assessment (Addendum)
@  1205, Received a call "Bre" with [facility], patient accepted to their facility for admission 06/18/2022, after 8am. The accepting provider is Dr. Riley Churches. Nurse report # [phone]. Patient is voluntary at this time. [facility] nursing staff will need to make the appropriate transport arrangements to [facility].    Patient's nurse, Reed Breech, RN provided disposition updates.   Patient's [facility] provider, Wallene Dales, NP provided disposition updates.

## 2022-06-17 NOTE — BH Assessment (Addendum)
Disposition Note:  Cecilio Asper, NP who is recommending patient for inpatient hospitalization. Clinician requested the [facility] AC, Antoinette Cillo, RN to consider patient for inpatient psychiatric treatment.   [facility] confirmed no [facility] bed availability at this time. Clinician will fax patient out to hospitals for consideration of inpatient treatment. Per [facility], [facility] may also need to be considered due to extensive history of inpatient hospitalizations.   So far, patient referred to the following outside hospitals for consideration of inpatient psychiatric treatment:  Destination  Service Provider Request Status Selected Services Address Phone Fax Patient Preferred  [facility]  Pending - Request Sent N/A [address], [place] [address] [phone] --  [facility]  Pending - Request Sent N/A [address], [place] [address] [phone] --  [facility]  Pending - Request Sent N/A [address] [place] [address] [phone] --  [facility]  Pending - Request Sent N/A [address] [place], [place] [address] [phone] --  [facility]  Pending - Request Sent N/A [address], [place] [address] [phone] --  [facility]  Pending - Request Sent N/A [address] [place], [place] [address] [phone] --  [facility]  Pending - Request Sent N/A [address] [place] [address] [phone] --  [facility]  Pending - Request Sent N/A [address] [place] [address] [phone] --  [facility]  Pending - Request Sent N/A [address] [place] [address] [phone] --  [facility]  Pending - Request Sent N/A [address], [place] [address] [phone]  --  [facility]  Pending - Request Sent N/A [address] [place] [address] [phone] --  [facility]  Pending - Request Sent N/A [address] [place] [address] [phone] --  [facility]  Pending - Request Sent N/A [address], [place] [address] [phone] --  [facility]  Pending - Request Sent N/A [address], [place] [address] [phone] --  [facility]  Pending - Request Sent N/A [name] [place] [address] [phone] --  [facility]  Pending - Request Sent N/A [address] [place] [address] [phone] --  [facility]  Pending - Request Sent N/A [address] [place] [address] [phone] --  [facility]  Pending - Request Sent N/A [address] [place] [address] [phone] --  [facility]  Pending - Request Sent N/A [address], [place] [address] [phone] --  [facility]  Pending - Request Sent N/A [address] [place] [address] [phone] --  [facility]  Pending - Request Sent N/A [address], [place] [address] [phone] --  [facility]  Pending - Request Sent N/A [address], [place] [address] [phone] --  [facility]  Pending - Request Sent N/A [address] [place] [address] [phone] --

## 2022-06-17 NOTE — ED Notes (Signed)
Patient trying to scratch herself so hard to draw blood on her arm. Sitter and this nurse has tried to redirect and patient will not comply.

## 2022-06-18 DIAGNOSIS — S51812A Laceration without foreign body of left forearm, initial encounter: Secondary | ICD-10-CM | POA: Diagnosis not present

## 2022-06-18 NOTE — ED Notes (Signed)
Paged report #, left callback

## 2022-06-18 NOTE — ED Provider Notes (Signed)
Emergency Medicine Observation Re-evaluation Note  [name] is a 32 y.o. female, seen on rounds today.  Pt initially presented to the ED for complaints of Suicidal Currently, the patient is resting in NAD.  Physical Exam  BP 113/60 (BP Location: Right Arm)   Pulse 82   Temp 98.6 F (37 C) (Oral)   Resp 16   LMP 04/25/2022 (Approximate) Comment: per pt, not pregnant  SpO2 98%  Physical Exam General: Appears to be resting comfortably in bed, no acute distress. Cardiac: Regular rate, normal heart rate, non-emergent blood pressure for this morning's vitals. Lungs: No increased work of breathing.  Equal chest rise appreciated Psych: Calm, asleep in bed.   ED Course / MDM  EKG:EKG Interpretation  Date/Time:  Saturday Jun 16 2022 21:57:41 EDT Ventricular Rate:  93 PR Interval:  136 QRS Duration: 93 QT Interval:  387 QTC Calculation: 482 R Axis:   46 Text Interpretation: Sinus rhythm Low voltage, precordial leads Anterolateral Q wave, probably normal for age No significant change since last tracing Confirmed by Linwood Dibbles [phone]) on 06/17/2022 4:25:46 PM  I have reviewed the labs performed to date as well as medications administered while in observation.   Plan  Current plan is for IP psychiatric care per most recent psychiatric assessment. Appreciate psychiatric team's plan for daily reassessment that may change this disposition.     Glyn Ade, MD 06/18/22 [phone]

## 2022-06-21 ENCOUNTER — Other Ambulatory Visit (HOSPITAL_COMMUNITY): Payer: Self-pay

## 2022-06-22 ENCOUNTER — Other Ambulatory Visit: Payer: Self-pay

## 2022-06-22 ENCOUNTER — Ambulatory Visit (HOSPITAL_COMMUNITY)
Admission: EM | Admit: 2022-06-22 | Discharge: 2022-06-23 | Disposition: A | Discharge status: Home / Self Care | Payer: Medicare PPO | Attending: Behavioral Health | Admitting: Behavioral Health

## 2022-06-22 DIAGNOSIS — F250 Schizoaffective disorder, bipolar type: Secondary | ICD-10-CM | POA: Insufficient documentation

## 2022-06-22 DIAGNOSIS — R45851 Suicidal ideations: Secondary | ICD-10-CM | POA: Diagnosis not present

## 2022-06-22 DIAGNOSIS — F411 Generalized anxiety disorder: Secondary | ICD-10-CM | POA: Diagnosis not present

## 2022-06-22 DIAGNOSIS — Z79899 Other long term (current) drug therapy: Secondary | ICD-10-CM | POA: Diagnosis not present

## 2022-06-22 LAB — POCT URINE DRUG SCREEN - MANUAL ENTRY (I-SCREEN)
POC Amphetamine UR: NOT DETECTED
POC Buprenorphine (BUP): NOT DETECTED
POC Cocaine UR: NOT DETECTED
POC Marijuana UR: NOT DETECTED
POC Methadone UR: NOT DETECTED
POC Methamphetamine UR: NOT DETECTED
POC Morphine: NOT DETECTED
POC Oxazepam (BZO): POSITIVE — AB
POC Oxycodone UR: NOT DETECTED
POC Secobarbital (BAR): NOT DETECTED

## 2022-06-22 LAB — CBC WITH DIFFERENTIAL/PLATELET
Abs Immature Granulocytes: 0.03 10*3/uL (ref 0.00–0.07)
Basophils Absolute: 0 10*3/uL (ref 0.0–0.1)
Basophils Relative: 0 %
Eosinophils Absolute: 0.1 10*3/uL (ref 0.0–0.5)
Eosinophils Relative: 1 %
HCT: 34.7 % — ABNORMAL LOW (ref 36.0–46.0)
Hemoglobin: 10.8 g/dL — ABNORMAL LOW (ref 12.0–15.0)
Immature Granulocytes: 0 %
Lymphocytes Relative: 30 %
Lymphs Abs: 2.4 10*3/uL (ref 0.7–4.0)
MCH: 24.3 pg — ABNORMAL LOW (ref 26.0–34.0)
MCHC: 31.1 g/dL (ref 30.0–36.0)
MCV: 78 fL — ABNORMAL LOW (ref 80.0–100.0)
Monocytes Absolute: 0.5 10*3/uL (ref 0.1–1.0)
Monocytes Relative: 6 %
Neutro Abs: 5.1 10*3/uL (ref 1.7–7.7)
Neutrophils Relative %: 63 %
Platelets: 299 10*3/uL (ref 150–400)
RBC: 4.45 MIL/uL (ref 3.87–5.11)
RDW: 14.4 % (ref 11.5–15.5)
WBC: 8.1 10*3/uL (ref 4.0–10.5)
nRBC: 0 % (ref 0.0–0.2)

## 2022-06-22 LAB — COMPREHENSIVE METABOLIC PANEL
ALT: 16 U/L (ref 0–44)
AST: 16 U/L (ref 15–41)
Albumin: 3.9 g/dL (ref 3.5–5.0)
Alkaline Phosphatase: 64 U/L (ref 38–126)
Anion gap: 12 (ref 5–15)
BUN: 5 mg/dL — ABNORMAL LOW (ref 6–20)
CO2: 22 mmol/L (ref 22–32)
Calcium: 9.2 mg/dL (ref 8.9–10.3)
Chloride: 104 mmol/L (ref 98–111)
Creatinine, Ser: 0.74 mg/dL (ref 0.44–1.00)
GFR, Estimated: >60 mL/min (ref >60)
Glucose, Bld: 93 mg/dL (ref 70–99)
Potassium: 3.6 mmol/L (ref 3.5–5.1)
Sodium: 138 mmol/L (ref 135–145)
Total Bilirubin: 0.3 mg/dL (ref 0.3–1.2)
Total Protein: 6.6 g/dL (ref 6.5–8.1)

## 2022-06-22 LAB — ETHANOL: Alcohol, Ethyl (B): <10 mg/dL (ref <10)

## 2022-06-22 LAB — POC URINE PREG, ED: Preg Test, Ur: NEGATIVE

## 2022-06-22 MED ORDER — MAGNESIUM HYDROXIDE 400 MG/5ML PO SUSP: 30.0000 mL | Freq: Every day | ORAL | Status: DC | PRN

## 2022-06-22 MED ORDER — ALUM & MAG HYDROXIDE-SIMETH 200-200-20 MG/5ML PO SUSP: 30.0000 mL | ORAL | Status: DC | PRN

## 2022-06-22 MED ORDER — PANTOPRAZOLE SODIUM 40 MG PO TBEC
40.0000 mg | DELAYED_RELEASE_TABLET | Freq: Every day | ORAL | Status: DC
  Administered 2022-06-23: 40 mg via ORAL
  Filled 2022-06-22 (×2): qty 1

## 2022-06-22 MED ORDER — OLANZAPINE 10 MG PO TBDP
10.0000 mg | ORAL_TABLET | Freq: Every day | ORAL | Status: DC
  Administered 2022-06-22: 10 mg via ORAL
  Filled 2022-06-22: qty 1

## 2022-06-22 MED ORDER — HYDROXYZINE HCL 25 MG PO TABS
25.0000 mg | ORAL_TABLET | Freq: Three times a day (TID) | ORAL | Status: DC | PRN
  Administered 2022-06-22: 25 mg via ORAL
  Filled 2022-06-22: qty 1

## 2022-06-22 MED ORDER — PRAZOSIN HCL 1 MG PO CAPS
1.0000 mg | ORAL_CAPSULE | Freq: Every day | ORAL | Status: DC
  Administered 2022-06-22: 1 mg via ORAL
  Filled 2022-06-22: qty 1

## 2022-06-22 MED ORDER — IBUPROFEN 600 MG PO TABS
600.0000 mg | ORAL_TABLET | Freq: Four times a day (QID) | ORAL | Status: DC | PRN
  Administered 2022-06-22: 600 mg via ORAL
  Filled 2022-06-22: qty 1

## 2022-06-22 MED ORDER — ESCITALOPRAM OXALATE 10 MG PO TABS
20.0000 mg | ORAL_TABLET | Freq: Every day | ORAL | Status: DC
  Administered 2022-06-22 – 2022-06-23 (×2): 20 mg via ORAL
  Filled 2022-06-22 (×2): qty 2

## 2022-06-22 MED ORDER — ALBUTEROL SULFATE HFA 108 (90 BASE) MCG/ACT IN AERS: 2.0000 | INHALATION_SPRAY | Freq: Four times a day (QID) | RESPIRATORY_TRACT | Status: DC | PRN

## 2022-06-22 MED ORDER — GABAPENTIN 300 MG PO CAPS
600.0000 mg | ORAL_CAPSULE | Freq: Three times a day (TID) | ORAL | Status: DC
  Administered 2022-06-22 – 2022-06-23 (×2): 600 mg via ORAL
  Filled 2022-06-22 (×2): qty 2

## 2022-06-22 MED ORDER — TRAZODONE HCL 50 MG PO TABS
50.0000 mg | ORAL_TABLET | Freq: Every evening | ORAL | Status: DC | PRN
  Administered 2022-06-22: 50 mg via ORAL
  Filled 2022-06-22: qty 1

## 2022-06-22 MED ORDER — TRIPLE ANTIBIOTIC 3.5-400-5000 EX OINT
1.0000 | TOPICAL_OINTMENT | Freq: Every day | CUTANEOUS | Status: DC
  Administered 2022-06-22: 1 via TOPICAL
  Filled 2022-06-22: qty 1

## 2022-06-22 MED ORDER — CYCLOBENZAPRINE HCL 10 MG PO TABS
10.0000 mg | ORAL_TABLET | Freq: Every day | ORAL | Status: DC
  Administered 2022-06-22: 10 mg via ORAL
  Filled 2022-06-22: qty 1

## 2022-06-22 NOTE — ED Provider Notes (Addendum)
[facility] Urgent Care Continuous Assessment Admission H&P  Date: 06/22/22 Patient Name: Stacia Brickett MRN: [id number] Chief Complaint: SI, anxiety and unable to obtain medications   Diagnoses:  Final diagnoses:  Schizoaffective disorder, bipolar type [facility])  Anxiety state  Suicidal ideation    HPI: Alia Neiss is a 32 year old female patient with a past psychiatric history significant for borderline personality disorder, schizoaffective disorder, bipolar type, PTSD, GAD, self-injurious behaviors, and past suicide attempts who presents to the [facility] behavioral health urgent care voluntary accompanied by the [facility]   Patient seen and evaluated face-to-face by this provider, chart reviewed and case discussed with Dr. Lucianne Muss. On evaluation, patient is alert and oriented x 4. Her thought process is linear and speech is clear and coherent. Her mood is anxious and affect is congruent. She has fair eye contact. She appears well groomed and is casually dressed. She is calm and cooperative and does not appear to be in acute distress. Patient states that she was discharged from [facility] yesterday and while she was there they made medication adjustments, increased gabapentin to 600 mg 3 times daily, Zyprexa to 10 mg nightly, and Lexapro to 20 mg daily. She states that she went to pick up her medications at the [facility] but they were not there and then she went to [facility] according to the discharge paperwork but the medications were not there. She states that herself and the CST tried to contact [facility] but was unable to get in contact with him. She provides this provider with her discharge papers from [facility] with her medication list. She states that she has not been able to take her medications today which has made her increasingly anxious and suicidal. She denies a suicide plan or intent. She rates her anxiety at 8 out of 10 with 10 being the worst. She describes her symptoms  as palpitations, sweats, nervousness, and racing thoughts. She denies homicidal ideations. She denies auditory or visual hallucinations. There is no objective evidence that the patient is currently responding to internal or external stimuli. She denies using illicit drugs or alcohol. She reports outpatient psychiatry with [facility]. She states that she will not be able to see Dr. Freida Busman until 5/29 for medication management. She is hopeful today to be able to get prescriptions for her medications. This provider contacted the [facility] pharmacy on [address] who confirmed that they did not receive electronic prescriptions from [facility] and that they were unable to get in contact with [facility] to verify prescriptions. I discussed admission to the continuous assessment unit for overnight due to SI and this provider will consult with the attending if prescriptions or sample medications can be provided at the time of discharge on 06/23/2022. Patient agreeable to plan of care.    Total Time spent with patient: 30 minutes  Musculoskeletal  Strength & Muscle Tone: within normal limits Gait & Station: normal Patient leans: N/A  Psychiatric Specialty Exam  Presentation General Appearance:  Appropriate for Environment  Eye Contact: Fair  Speech: Clear and Coherent  Speech Volume: Normal  Handedness: Right   Mood and Affect  Mood: Anxious  Affect: Congruent   Thought Process  Thought Processes: Coherent  Descriptions of Associations:Intact  Orientation:Full (Time, Place and Person)  Thought Content:Logical  Diagnosis of Schizophrenia or Schizoaffective disorder in past: Yes  Duration of Psychotic Symptoms: Greater than six months  Hallucinations:Hallucinations: None  Ideas of Reference:None  Suicidal Thoughts:Suicidal Thoughts: Yes, Passive SI Passive Intent and/or  Plan: Without Intent; Without Plan  Homicidal Thoughts:Homicidal Thoughts: No   Sensorium   Memory: Immediate Fair; Recent Fair; Remote Fair  Judgment: Fair  Insight: Fair   Chartered certified accountant: Fair  Attention Span: Fair  Recall: [employer] of Knowledge: Fair  Language: Fair   Psychomotor Activity  Psychomotor Activity: Psychomotor Activity: Normal   Assets  Assets: Communication Skills; Desire for Improvement; Housing; Leisure Time; Physical Health; Social Support; Health and safety inspector   Sleep  Sleep: Sleep: Fair   Nutritional Assessment (For OBS and FBC admissions only) Has the patient had a weight loss or gain of 10 pounds or more in the last 3 months?: No Has the patient had a decrease in food intake/or appetite?: No Does the patient have dental problems?: No Does the patient have eating habits or behaviors that may be indicators of an eating disorder including binging or inducing vomiting?: No Has the patient recently lost weight without trying?: 0 Has the patient been eating poorly because of a decreased appetite?: 0 Malnutrition Screening Tool Score: 0    Physical Exam HENT:     Nose: Nose normal.  Cardiovascular:     Rate and Rhythm: Normal rate.  Pulmonary:     Effort: Pulmonary effort is normal.  Musculoskeletal:     Cervical back: Normal range of motion.  Neurological:     Mental Status: She is alert and oriented to person, place, and time.    Review of Systems  Constitutional: Negative.   HENT: Negative.    Eyes: Negative.   Respiratory: Negative.    Cardiovascular: Negative.   Gastrointestinal: Negative.   Genitourinary: Negative.   Musculoskeletal: Negative.   Neurological: Negative.   Endo/Heme/Allergies: Negative.     Last menstrual period 04/25/2022. There is no height or weight on file to calculate BMI.  Past Psychiatric History: History of borderline personality disorder, schizoaffective disorder, bipolar type, PTSD, GAD, self-injurious behaviors, and past suicide attempts. Patient  recently hospitalized at [facility] from 06/18/22-06/21/2022.   Is the patient at risk to self? Yes  Has the patient been a risk to self in the past 6 months? Yes .    Has the patient been a risk to self within the distant past? Yes   Is the patient a risk to others? No   Has the patient been a risk to others in the past 6 months? No   Has the patient been a risk to others within the distant past? No   Past Medical History: History of fibromyalgia and asthma  Family History: No history reported  Social History: Patient resides with her ex-boyfriend Fayrene Fearing. Patient denies drinking alcohol or using illicit drugs. Patient reports volunteering at [facility]. Patient has a daughter that is not in her custody.  Last Labs:  Admission on 06/16/2022, Discharged on 06/18/2022  Component Date Value Ref Range Status   Sodium 06/16/2022 139  135 - 145 mmol/L Final   Potassium 06/16/2022 3.5  3.5 - 5.1 mmol/L Final   Chloride 06/16/2022 109  98 - 111 mmol/L Final   CO2 06/16/2022 21 (L)  22 - 32 mmol/L Final   Glucose, Bld 06/16/2022 90  70 - 99 mg/dL Final   Glucose reference range applies only to samples taken after fasting for at least 8 hours.   BUN 06/16/2022 10  6 - 20 mg/dL Final   Creatinine, Ser 06/16/2022 0.71  0.44 - 1.00 mg/dL Final   Calcium 16/11/9602 8.7 (L)  8.9 -  10.3 mg/dL Final   Total Protein 16/11/9602 6.8  6.5 - 8.1 g/dL Final   Albumin 54/10/8117 3.7  3.5 - 5.0 g/dL Final   AST 14/78/2956 18  15 - 41 U/L Final   ALT 06/16/2022 18  0 - 44 U/L Final   Alkaline Phosphatase 06/16/2022 59  38 - 126 U/L Final   Total Bilirubin 06/16/2022 0.4  0.3 - 1.2 mg/dL Final   GFR, Estimated 06/16/2022 >60  >60 mL/min Final   Comment: (NOTE) Calculated using the CKD-EPI Creatinine Equation (2021)    Anion gap 06/16/2022 9  5 - 15 Final   Performed at [facility], [address] [place], [place] [address]   Alcohol, Ethyl (B) 06/16/2022 <10  <10  mg/dL Final   Comment: (NOTE) Lowest detectable limit for serum alcohol is 10 mg/dL.  For medical purposes only. Performed at [name], [address], [place], [place] [address]    Salicylate Lvl 06/16/2022 <7.0 (L)  7.0 - 30.0 mg/dL Final   Performed at [facility], [address] [place], [place] [address]   Acetaminophen (Tylenol), Serum 06/16/2022 <10 (L)  10 - 30 ug/mL Final   Comment: (NOTE) Therapeutic concentrations vary significantly. A range of 10-30 ug/mL  may be an effective concentration for many patients. However, some  are best treated at concentrations outside of this range. Acetaminophen concentrations >150 ug/mL at 4 hours after ingestion  and >50 ug/mL at 12 hours after ingestion are often associated with  toxic reactions.  Performed at [facility], [address] [place], [place] [address]    WBC 06/16/2022 10.6 (H)  4.0 - 10.5 K/uL Final   RBC 06/16/2022 4.14  3.87 - 5.11 MIL/uL Final   Hemoglobin 06/16/2022 10.3 (L)  12.0 - 15.0 g/dL Final   HCT 32/44/0102 33.5 (L)  36.0 - 46.0 % Final   MCV 06/16/2022 80.9  80.0 - 100.0 fL Final   MCH 06/16/2022 24.9 (L)  26.0 - 34.0 pg Final   MCHC 06/16/2022 30.7  30.0 - 36.0 g/dL Final   RDW 72/53/6644 14.4  11.5 - 15.5 % Final   Platelets 06/16/2022 276  150 - 400 K/uL Final   nRBC 06/16/2022 0.0  0.0 - 0.2 % Final   Performed at [facility], [address] [place], [place] [address]   Opiates 06/17/2022 NONE DETECTED  NONE DETECTED Final   Cocaine 06/17/2022 NONE DETECTED  NONE DETECTED Final   Benzodiazepines 06/17/2022 NONE DETECTED  NONE DETECTED Final   Amphetamines 06/17/2022 NONE DETECTED  NONE DETECTED Final   Tetrahydrocannabinol 06/17/2022 NONE DETECTED  NONE DETECTED Final   Barbiturates 06/17/2022 NONE DETECTED  NONE DETECTED Final   Comment: (NOTE) DRUG SCREEN FOR MEDICAL PURPOSES ONLY.  IF CONFIRMATION IS NEEDED FOR  ANY PURPOSE, NOTIFY LAB WITHIN 5 DAYS.  LOWEST DETECTABLE LIMITS FOR URINE DRUG SCREEN Drug Class                     Cutoff (ng/mL) Amphetamine and metabolites    1000 Barbiturate and metabolites    200 Benzodiazepine                 200 Opiates and metabolites        300 Cocaine and metabolites        300 THC  50 Performed at [facility], [address], [place], [place] [address]    I-stat hCG, quantitative 06/16/2022 <5.0  <5 mIU/mL Final   Comment 3 06/16/2022          Final   Comment:   GEST. AGE      CONC.  (mIU/mL)   <=1 WEEK        5 - 50     2 WEEKS       50 - 500     3 WEEKS       100 - 10,000     4 WEEKS     1,000 - 30,000        FEMALE AND NON-PREGNANT FEMALE:     LESS THAN 5 mIU/mL   Admission on 06/04/2022, Discharged on 06/07/2022  Component Date Value Ref Range Status   Glucose-Capillary 06/04/2022 109 (H)  70 - 99 mg/dL Final   Glucose reference range applies only to samples taken after fasting for at least 8 hours.   Sodium 06/04/2022 138  135 - 145 mmol/L Final   Potassium 06/04/2022 3.3 (L)  3.5 - 5.1 mmol/L Final   Chloride 06/04/2022 108  98 - 111 mmol/L Final   CO2 06/04/2022 23  22 - 32 mmol/L Final   Glucose, Bld 06/04/2022 119 (H)  70 - 99 mg/dL Final   Glucose reference range applies only to samples taken after fasting for at least 8 hours.   BUN 06/04/2022 10  6 - 20 mg/dL Final   Creatinine, Ser 06/04/2022 0.79  0.44 - 1.00 mg/dL Final   Calcium 13/24/4010 8.7 (L)  8.9 - 10.3 mg/dL Final   Total Protein 27/25/3664 7.2  6.5 - 8.1 g/dL Final   Albumin 40/34/7425 3.7  3.5 - 5.0 g/dL Final   AST 95/63/8756 19  15 - 41 U/L Final   ALT 06/04/2022 15  0 - 44 U/L Final   Alkaline Phosphatase 06/04/2022 68  38 - 126 U/L Final   Total Bilirubin 06/04/2022 0.5  0.3 - 1.2 mg/dL Final   GFR, Estimated 06/04/2022 >60  >60 mL/min Final   Comment: (NOTE) Calculated using the CKD-EPI Creatinine Equation (2021)     Anion gap 06/04/2022 7  5 - 15 Final   Performed at [facility], [address], [place], [place] [address]   Salicylate Lvl 06/04/2022 <7.0 (L)  7.0 - 30.0 mg/dL Final   Performed at [facility], [address] [place], [place] [address]   Acetaminophen (Tylenol), Serum 06/04/2022 <10 (L)  10 - 30 ug/mL Final   Comment: (NOTE) Therapeutic concentrations vary significantly. A range of 10-30 ug/mL  may be an effective concentration for many patients. However, some  are best treated at concentrations outside of this range. Acetaminophen concentrations >150 ug/mL at 4 hours after ingestion  and >50 ug/mL at 12 hours after ingestion are often associated with  toxic reactions.  Performed at [facility], [address] [place], [place] [address]    Alcohol, Ethyl (B) 06/04/2022 <10  <10 mg/dL Final   Comment: (NOTE) Lowest detectable limit for serum alcohol is 10 mg/dL.  For medical purposes only. Performed at [facility], [address] [place], [place] [address]    Opiates 06/05/2022 NONE DETECTED  NONE DETECTED Final   Cocaine 06/05/2022 NONE DETECTED  NONE DETECTED Final   Benzodiazepines 06/05/2022 NONE DETECTED  NONE DETECTED Final   Amphetamines 06/05/2022 NONE DETECTED  NONE DETECTED Final   Tetrahydrocannabinol 06/05/2022 NONE DETECTED  NONE DETECTED Final   Barbiturates 06/05/2022 NONE DETECTED  NONE DETECTED Final   Comment: (NOTE) DRUG SCREEN FOR MEDICAL PURPOSES ONLY.  IF CONFIRMATION IS NEEDED FOR ANY PURPOSE, NOTIFY LAB WITHIN 5 DAYS.  LOWEST DETECTABLE LIMITS FOR URINE DRUG SCREEN Drug Class                     Cutoff (ng/mL) Amphetamine and metabolites    1000 Barbiturate and metabolites    200 Benzodiazepine                 200 Opiates and metabolites        300 Cocaine and metabolites        300 THC                            50 Performed at [facility],  [address] [place], [place] [address]    WBC 06/04/2022 6.6  4.0 - 10.5 K/uL Final   RBC 06/04/2022 4.32  3.87 - 5.11 MIL/uL Final   Hemoglobin 06/04/2022 10.9 (L)  12.0 - 15.0 g/dL Final   HCT 60/45/4098 34.6 (L)  36.0 - 46.0 % Final   MCV 06/04/2022 80.1  80.0 - 100.0 fL Final   MCH 06/04/2022 25.2 (L)  26.0 - 34.0 pg Final   MCHC 06/04/2022 31.5  30.0 - 36.0 g/dL Final   RDW 11/91/4782 14.1  11.5 - 15.5 % Final   Platelets 06/04/2022 254  150 - 400 K/uL Final   nRBC 06/04/2022 0.0  0.0 - 0.2 % Final   Neutrophils Relative % 06/04/2022 58  % Final   Neutro Abs 06/04/2022 3.8  1.7 - 7.7 K/uL Final   Lymphocytes Relative 06/04/2022 36  % Final   Lymphs Abs 06/04/2022 2.4  0.7 - 4.0 K/uL Final   Monocytes Relative 06/04/2022 5  % Final   Monocytes Absolute 06/04/2022 0.3  0.1 - 1.0 K/uL Final   Eosinophils Relative 06/04/2022 1  % Final   Eosinophils Absolute 06/04/2022 0.0  0.0 - 0.5 K/uL Final   Basophils Relative 06/04/2022 0  % Final   Basophils Absolute 06/04/2022 0.0  0.0 - 0.1 K/uL Final   Immature Granulocytes 06/04/2022 0  % Final   Abs Immature Granulocytes 06/04/2022 0.02  0.00 - 0.07 K/uL Final   Performed at [facility], [address], [place], [place] [address]   I-stat hCG, quantitative 06/05/2022 <5.0  <5 mIU/mL Final   Comment 3 06/05/2022          Final   Comment:   GEST. AGE      CONC.  (mIU/mL)   <=1 WEEK        5 - 50     2 WEEKS       50 - 500     3 WEEKS       100 - 10,000     4 WEEKS     1,000 - 30,000        FEMALE AND NON-PREGNANT FEMALE:     LESS THAN 5 mIU/mL    Sodium 06/05/2022 142  135 - 145 mmol/L Final   Potassium 06/05/2022 3.5  3.5 - 5.1 mmol/L Final   Chloride 06/05/2022 106  98 - 111 mmol/L Final   BUN 06/05/2022 10  6 - 20 mg/dL Final   Creatinine, Ser 06/05/2022 0.70  0.44 - 1.00 mg/dL Final   Glucose, Bld 81/19/1478 107 (H)  70 - 99 mg/dL Final   Glucose reference range applies only to samples taken after  fasting for at least 8 hours.   Calcium, Ion 06/05/2022 1.24  1.15 - 1.40 mmol/L Final   TCO2 06/05/2022 26  22 - 32 mmol/L Final   Hemoglobin 06/05/2022 10.9 (L)  12.0 - 15.0 g/dL Final   HCT 29/56/2130 32.0 (L)  36.0 - 46.0 % Final   Magnesium 06/04/2022 2.1  1.7 - 2.4 mg/dL Final   Performed at [facility], [address], [place], [place] [address]   Acetaminophen (Tylenol), Serum 06/05/2022 <10 (L)  10 - 30 ug/mL Final   Comment: (NOTE) Therapeutic concentrations vary significantly. A range of 10-30 ug/mL  may be an effective concentration for many patients. However, some  are best treated at concentrations outside of this range. Acetaminophen concentrations >150 ug/mL at 4 hours after ingestion  and >50 ug/mL at 12 hours after ingestion are often associated with  toxic reactions.  Performed at [facility], [address], [place], [place] [address]    WBC 06/05/2022 7.1  4.0 - 10.5 K/uL Final   RBC 06/05/2022 4.11  3.87 - 5.11 MIL/uL Final   Hemoglobin 06/05/2022 10.5 (L)  12.0 - 15.0 g/dL Final   HCT 95/28/4132 33.8 (L)  36.0 - 46.0 % Final   MCV 06/05/2022 82.2  80.0 - 100.0 fL Final   MCH 06/05/2022 25.5 (L)  26.0 - 34.0 pg Final   MCHC 06/05/2022 31.1  30.0 - 36.0 g/dL Final   RDW 44/02/270 14.5  11.5 - 15.5 % Final   Platelets 06/05/2022 259  150 - 400 K/uL Final   nRBC 06/05/2022 0.0  0.0 - 0.2 % Final   Performed at [facility], [address] [place], [place] [address]   Creatinine, Ser 06/05/2022 0.69  0.44 - 1.00 mg/dL Final   GFR, Estimated 06/05/2022 >60  >60 mL/min Final   Comment: (NOTE) Calculated using the CKD-EPI Creatinine Equation (2021) Performed at [facility], [address], [place], [place] [address]    Vitamin B-12 06/05/2022 3,066 (H)  180 - 914 pg/mL Final   Comment: RESULT CONFIRMED BY MANUAL DILUTION (NOTE) This assay is not validated for testing neonatal  or myeloproliferative syndrome specimens for Vitamin B12 levels. Performed at [facility], [address], [place], [place] [address]    Folate 06/05/2022 13.2  >5.9 ng/mL Final   Performed at [facility], [address], [place], [place] [address]   Iron 06/05/2022 35  28 - 170 ug/dL Final   TIBC 64/33/2951 396  250 - 450 ug/dL Final   Saturation Ratios 06/05/2022 9 (L)  10.4 - 31.8 % Final   UIBC 06/05/2022 361  ug/dL Final   Performed at [facility], [address], [place], [place] [address]   Ferritin 06/05/2022 12  11 - 307 ng/mL Final   Performed at [facility], [address] [place], [place] [address]   Retic Ct Pct 06/05/2022 1.6  0.4 - 3.1 % Final   RBC. 06/05/2022 4.14  3.87 - 5.11 MIL/uL Final   Retic Count, Absolute 06/05/2022 65.0  19.0 - 186.0 K/uL Final   Immature Retic Fract 06/05/2022 13.6  2.3 - 15.9 % Final   Performed at [facility], [address], [place], [place] [address]  Admission  on 05/25/2022, Discharged on 05/28/2022  Component Date Value Ref Range Status   WBC 05/25/2022 6.9  4.0 - 10.5 K/uL Final   RBC 05/25/2022 4.40  3.87 - 5.11 MIL/uL Final   Hemoglobin 05/25/2022 11.1 (L)  12.0 - 15.0 g/dL Final   HCT 16/11/9602 35.8 (L)  36.0 - 46.0 % Final   MCV 05/25/2022 81.4  80.0 - 100.0 fL Final   MCH 05/25/2022 25.2 (L)  26.0 - 34.0 pg Final   MCHC 05/25/2022 31.0  30.0 - 36.0 g/dL Final   RDW 54/10/8117 14.5  11.5 - 15.5 % Final   Platelets 05/25/2022 277  150 - 400 K/uL Final   nRBC 05/25/2022 0.0  0.0 - 0.2 % Final   Neutrophils Relative % 05/25/2022 59  % Final   Neutro Abs 05/25/2022 4.0  1.7 - 7.7 K/uL Final   Lymphocytes Relative 05/25/2022 33  % Final   Lymphs Abs 05/25/2022 2.3  0.7 - 4.0 K/uL Final   Monocytes Relative 05/25/2022 6  % Final   Monocytes Absolute 05/25/2022 0.4  0.1 - 1.0 K/uL Final   Eosinophils Relative 05/25/2022 1  % Final    Eosinophils Absolute 05/25/2022 0.0  0.0 - 0.5 K/uL Final   Basophils Relative 05/25/2022 1  % Final   Basophils Absolute 05/25/2022 0.0  0.0 - 0.1 K/uL Final   Immature Granulocytes 05/25/2022 0  % Final   Abs Immature Granulocytes 05/25/2022 0.03  0.00 - 0.07 K/uL Final   Performed at [facility], [address] [place], [place] [address]   Sodium 05/25/2022 138  135 - 145 mmol/L Final   Potassium 05/25/2022 3.8  3.5 - 5.1 mmol/L Final   Chloride 05/25/2022 110  98 - 111 mmol/L Final   CO2 05/25/2022 19 (L)  22 - 32 mmol/L Final   Glucose, Bld 05/25/2022 104 (H)  70 - 99 mg/dL Final   Glucose reference range applies only to samples taken after fasting for at least 8 hours.   BUN 05/25/2022 6  6 - 20 mg/dL Final   Creatinine, Ser 05/25/2022 0.77  0.44 - 1.00 mg/dL Final   Calcium 95/62/1308 8.8 (L)  8.9 - 10.3 mg/dL Final   Total Protein 65/78/4696 6.7  6.5 - 8.1 g/dL Final   Albumin 29/52/8413 3.6  3.5 - 5.0 g/dL Final   AST 24/40/1027 18  15 - 41 U/L Final   ALT 05/25/2022 15  0 - 44 U/L Final   Alkaline Phosphatase 05/25/2022 72  38 - 126 U/L Final   Total Bilirubin 05/25/2022 0.3  0.3 - 1.2 mg/dL Final   GFR, Estimated 05/25/2022 >60  >60 mL/min Final   Comment: (NOTE) Calculated using the CKD-EPI Creatinine Equation (2021)    Anion gap 05/25/2022 9  5 - 15 Final   Performed at [facility], [address] [place], [place] [address]   Alcohol, Ethyl (B) 05/25/2022 <10  <10 mg/dL Final   Comment: (NOTE) Lowest detectable limit for serum alcohol is 10 mg/dL.  For medical purposes only. Performed at [facility], [address] [place], [place] [address]    Salicylate Lvl 05/25/2022 <7.0 (L)  7.0 - 30.0 mg/dL Final   Performed at [facility], [address], [place], [place] [address]   Acetaminophen (Tylenol), Serum 05/25/2022 <10 (L)  10 - 30 ug/mL Final   Comment: (NOTE) Therapeutic concentrations vary significantly. A range of 10-30 ug/mL   may be an effective  concentration for many patients. However, some  are best treated at concentrations outside of this range. Acetaminophen concentrations >150 ug/mL at 4 hours after ingestion  and >50 ug/mL at 12 hours after ingestion are often associated with  toxic reactions.  Performed at [facility], [address] [place], [place] [address]    Opiates 05/25/2022 NONE DETECTED  NONE DETECTED Final   Cocaine 05/25/2022 NONE DETECTED  NONE DETECTED Final   Benzodiazepines 05/25/2022 NONE DETECTED  NONE DETECTED Final   Amphetamines 05/25/2022 NONE DETECTED  NONE DETECTED Final   Tetrahydrocannabinol 05/25/2022 NONE DETECTED  NONE DETECTED Final   Barbiturates 05/25/2022 NONE DETECTED  NONE DETECTED Final   Comment: (NOTE) DRUG SCREEN FOR MEDICAL PURPOSES ONLY.  IF CONFIRMATION IS NEEDED FOR ANY PURPOSE, NOTIFY LAB WITHIN 5 DAYS.  LOWEST DETECTABLE LIMITS FOR URINE DRUG SCREEN Drug Class                     Cutoff (ng/mL) Amphetamine and metabolites    1000 Barbiturate and metabolites    200 Benzodiazepine                 200 Opiates and metabolites        300 Cocaine and metabolites        300 THC                            50 Performed at [facility], [address], [place], [place] [address]    I-stat hCG, quantitative 05/25/2022 <5.0  <5 mIU/mL Final   Comment 3 05/25/2022          Final   Comment:   GEST. AGE      CONC.  (mIU/mL)   <=1 WEEK        5 - 50     2 WEEKS       50 - 500     3 WEEKS       100 - 10,000     4 WEEKS     1,000 - 30,000        FEMALE AND NON-PREGNANT FEMALE:     LESS THAN 5 mIU/mL    Acetaminophen (Tylenol), Serum 05/25/2022 <10 (L)  10 - 30 ug/mL Final   Comment: (NOTE) Therapeutic concentrations vary significantly. A range of 10-30 ug/mL  may be an effective concentration for many patients. However, some  are best treated at concentrations outside of this range. Acetaminophen concentrations >150 ug/mL at 4 hours after  ingestion  and >50 ug/mL at 12 hours after ingestion are often associated with  toxic reactions.  Performed at [facility], [address], [place], [place] [address]    SARS Coronavirus 2 by RT PCR 05/27/2022 NEGATIVE  NEGATIVE Final   Influenza A by PCR 05/27/2022 NEGATIVE  NEGATIVE Final   Influenza B by PCR 05/27/2022 NEGATIVE  NEGATIVE Final   Comment: (NOTE) The Xpert Xpress SARS-CoV-2/FLU/RSV plus assay is intended as an aid in the diagnosis of influenza from Nasopharyngeal swab specimens and should not be used as a sole basis for treatment. Nasal washings and aspirates are unacceptable for Xpert Xpress SARS-CoV-2/FLU/RSV testing.  Fact Sheet for Patients: BloggerCourse.com  Fact Sheet for Healthcare Providers: SeriousBroker.it  This test is not yet approved or cleared by the [place] FDA and has been authorized for detection and/or diagnosis of SARS-CoV-2 by FDA under an Emergency Use Authorization (  EUA). This EUA will remain in effect (meaning this test can be used) for the duration of the COVID-19 declaration under Section 564(b)(1) of the Act, 21 U.S.C. section 360bbb-3(b)(1), unless the authorization is terminated or revoked.     Resp Syncytial Virus by PCR 05/27/2022 NEGATIVE  NEGATIVE Final   Comment: (NOTE) Fact Sheet for Patients: BloggerCourse.com  Fact Sheet for Healthcare Providers: SeriousBroker.it  This test is not yet approved or cleared by the [place] FDA and has been authorized for detection and/or diagnosis of SARS-CoV-2 by FDA under an Emergency Use Authorization (EUA). This EUA will remain in effect (meaning this test can be used) for the duration of the COVID-19 declaration under Section 564(b)(1) of the Act, 21 U.S.C. section 360bbb-3(b)(1), unless the authorization is terminated or revoked.  Performed at [facility], [address], [place], [place] [address]   Admission on 05/19/2022, Discharged on 05/21/2022  Component Date Value Ref Range Status   Sodium 05/19/2022 136  135 - 145 mmol/L Final   Potassium 05/19/2022 3.7  3.5 - 5.1 mmol/L Final   Chloride 05/19/2022 107  98 - 111 mmol/L Final   CO2 05/19/2022 22  22 - 32 mmol/L Final   Glucose, Bld 05/19/2022 99  70 - 99 mg/dL Final   Glucose reference range applies only to samples taken after fasting for at least 8 hours.   BUN 05/19/2022 9  6 - 20 mg/dL Final   Creatinine, Ser 05/19/2022 0.70  0.44 - 1.00 mg/dL Final   Calcium 19/14/7829 8.7 (L)  8.9 - 10.3 mg/dL Final   Total Protein 56/21/3086 7.3  6.5 - 8.1 g/dL Final   Albumin 57/84/6962 3.9  3.5 - 5.0 g/dL Final   AST 95/28/4132 21  15 - 41 U/L Final   ALT 05/19/2022 17  0 - 44 U/L Final   Alkaline Phosphatase 05/19/2022 72  38 - 126 U/L Final   Total Bilirubin 05/19/2022 0.4  0.3 - 1.2 mg/dL Final   GFR, Estimated 05/19/2022 >60  >60 mL/min Final   Comment: (NOTE) Calculated using the CKD-EPI Creatinine Equation (2021)    Anion gap 05/19/2022 7  5 - 15 Final   Performed at [facility], [address] [place], [place] [address]   Acetaminophen (Tylenol), Serum 05/19/2022 <10 (L)  10 - 30 ug/mL Final   Comment: (NOTE) Therapeutic concentrations vary significantly. A range of 10-30 ug/mL  may be an effective concentration for many patients. However, some  are best treated at concentrations outside of this range. Acetaminophen concentrations >150 ug/mL at 4 hours after ingestion  and >50 ug/mL at 12 hours after ingestion are often associated with  toxic reactions.  Performed at [facility], [address] [place], [place] [address]    Salicylate Lvl 05/19/2022 <7.0 (L)  7.0 - 30.0 mg/dL Final   Performed at [facility] [place], [address] [place], [place] [address]   WBC 05/19/2022 10.1  4.0 - 10.5 K/uL Final   RBC 05/19/2022  5.13 (H)  3.87 - 5.11 MIL/uL Final   Hemoglobin 05/19/2022 12.8  12.0 - 15.0 g/dL Final   HCT 34/74/2595 41.6  36.0 - 46.0 % Final   MCV 05/19/2022 81.1  80.0 - 100.0 fL Final   MCH 05/19/2022 25.0 (L)  26.0 - 34.0 pg Final   MCHC 05/19/2022 30.8  30.0 - 36.0 g/dL Final   RDW 63/87/5643 14.6  11.5 - 15.5 % Final   Platelets  05/19/2022 328  150 - 400 K/uL Final   nRBC 05/19/2022 0.0  0.0 - 0.2 % Final   Neutrophils Relative % 05/19/2022 66  % Final   Neutro Abs 05/19/2022 6.6  1.7 - 7.7 K/uL Final   Lymphocytes Relative 05/19/2022 28  % Final   Lymphs Abs 05/19/2022 2.8  0.7 - 4.0 K/uL Final   Monocytes Relative 05/19/2022 5  % Final   Monocytes Absolute 05/19/2022 0.5  0.1 - 1.0 K/uL Final   Eosinophils Relative 05/19/2022 1  % Final   Eosinophils Absolute 05/19/2022 0.1  0.0 - 0.5 K/uL Final   Basophils Relative 05/19/2022 0  % Final   Basophils Absolute 05/19/2022 0.0  0.0 - 0.1 K/uL Final   Immature Granulocytes 05/19/2022 0  % Final   Abs Immature Granulocytes 05/19/2022 0.02  0.00 - 0.07 K/uL Final   Performed at [facility], [address] [place], [place] [address]   Magnesium 05/19/2022 1.9  1.7 - 2.4 mg/dL Final   Performed at [facility], [address] [place], [place] [address]   Alcohol, Ethyl (B) 05/19/2022 <10  <10 mg/dL Final   Comment: (NOTE) Lowest detectable limit for serum alcohol is 10 mg/dL.  For medical purposes only. Performed at [facility], [address], [place], [place] [address]    I-stat hCG, quantitative 05/19/2022 <5.0  <5 mIU/mL Final   Comment 3 05/19/2022          Final   Comment:   GEST. AGE      CONC.  (mIU/mL)   <=1 WEEK        5 - 50     2 WEEKS       50 - 500     3 WEEKS       100 - 10,000     4 WEEKS     1,000 - 30,000        FEMALE AND NON-PREGNANT FEMALE:     LESS THAN 5 mIU/mL    Opiates 05/19/2022 NONE DETECTED  NONE DETECTED Final   Cocaine 05/19/2022 NONE DETECTED   NONE DETECTED Final   Benzodiazepines 05/19/2022 NONE DETECTED  NONE DETECTED Final   Amphetamines 05/19/2022 NONE DETECTED  NONE DETECTED Final   Tetrahydrocannabinol 05/19/2022 NONE DETECTED  NONE DETECTED Final   Barbiturates 05/19/2022 NONE DETECTED  NONE DETECTED Final   Comment: (NOTE) DRUG SCREEN FOR MEDICAL PURPOSES ONLY.  IF CONFIRMATION IS NEEDED FOR ANY PURPOSE, NOTIFY LAB WITHIN 5 DAYS.  LOWEST DETECTABLE LIMITS FOR URINE DRUG SCREEN Drug Class                     Cutoff (ng/mL) Amphetamine and metabolites    1000 Barbiturate and metabolites    200 Benzodiazepine                 200 Opiates and metabolites        300 Cocaine and metabolites        300 THC                            50 Performed at [facility], [address], [place], [place] [address]    Acetaminophen (Tylenol), Serum 05/19/2022 <10 (L)  10 - 30 ug/mL Final   Comment: (NOTE) Therapeutic concentrations vary significantly. A range of 10-30 ug/mL  may be an effective concentration for many patients. However, some  are best treated at concentrations outside of this range. Acetaminophen concentrations >150 ug/mL at 4 hours after ingestion  and >50 ug/mL at 12 hours after ingestion are often associated with  toxic reactions.  Performed at [facility], [address] [place], [place] [address]    Sodium 05/19/2022 134 (L)  135 - 145 mmol/L Final   Potassium 05/19/2022 3.3 (L)  3.5 - 5.1 mmol/L Final   Chloride 05/19/2022 107  98 - 111 mmol/L Final   CO2 05/19/2022 23  22 - 32 mmol/L Final   Glucose, Bld 05/19/2022 103 (H)  70 - 99 mg/dL Final   Glucose reference range applies only to samples taken after fasting for at least 8 hours.   BUN 05/19/2022 8  6 - 20 mg/dL Final   Creatinine, Ser 05/19/2022 0.79  0.44 - 1.00 mg/dL Final   Calcium 60/45/4098 8.3 (L)  8.9 - 10.3 mg/dL Final   GFR, Estimated 05/19/2022 >60  >60 mL/min Final   Comment: (NOTE) Calculated  using the CKD-EPI Creatinine Equation (2021)    Anion gap 05/19/2022 4 (L)  5 - 15 Final   Performed at [facility], [address] [place], [place] [address]   Magnesium 05/19/2022 2.3  1.7 - 2.4 mg/dL Final   Performed at [facility], [address], [place], [place] [address]   SARS Coronavirus 2 by RT PCR 05/21/2022 NEGATIVE  NEGATIVE Final   Comment: (NOTE) SARS-CoV-2 target nucleic acids are NOT DETECTED.  The SARS-CoV-2 RNA is generally detectable in upper and lower respiratory specimens during the acute phase of infection. The lowest concentration of SARS-CoV-2 viral copies this assay can detect is 250 copies / mL. A negative result does not preclude SARS-CoV-2 infection and should not be used as the sole basis for treatment or other patient management decisions.  A negative result may occur with improper specimen collection / handling, submission of specimen other than nasopharyngeal swab, presence of viral mutation(s) within the areas targeted by this assay, and inadequate number of viral copies (<250 copies / mL). A negative result must be combined with clinical observations, patient history, and epidemiological information.  Fact Sheet for Patients:   RoadLapTop.co.za  Fact Sheet for Healthcare Providers: http://kim-miller.com/  This test is not yet approved or                           cleared by the [place] FDA and has been authorized for detection and/or diagnosis of SARS-CoV-2 by FDA under an Emergency Use Authorization (EUA).  This EUA will remain in effect (meaning this test can be used) for the duration of the COVID-19 declaration under Section 564(b)(1) of the Act, 21 U.S.C. section 360bbb-3(b)(1), unless the authorization is terminated or revoked sooner.  Performed at [facility], [address], [place], [place] [address]   Admission on  05/15/2022, Discharged on 05/16/2022  Component Date Value Ref Range Status   SARS Coronavirus 2 by RT PCR 05/15/2022 NEGATIVE  NEGATIVE Final   Performed at [facility], [address] [place], [place] [address]   SARSCOV2ONAVIRUS 2 AG 05/16/2022 NEGATIVE  NEGATIVE Final   Comment: (NOTE) SARS-CoV-2 antigen NOT DETECTED.   Negative results are presumptive.  Negative results do not preclude SARS-CoV-2 infection and should not be used as the sole basis for treatment or other patient management decisions, including infection  control decisions, particularly in the presence of  clinical signs and  symptoms consistent with COVID-19, or in those who have been in contact with the virus.  Negative results must be combined with clinical observations, patient history, and epidemiological information. The expected result is Negative.  Fact Sheet for Patients: https://www.jennings-kim.com/  Fact Sheet for Healthcare Providers: https://alexander-rogers.biz/  This test is not yet approved or cleared by the [place] FDA and  has been authorized for detection and/or diagnosis of SARS-CoV-2 by FDA under an Emergency Use Authorization (EUA).  This EUA will remain in effect (meaning this test can be used) for the duration of  the COV                          ID-19 declaration under Section 564(b)(1) of the Act, 21 U.S.C. section 360bbb-3(b)(1), unless the authorization is terminated or revoked sooner.    Admission on 05/13/2022, Discharged on 05/13/2022  Component Date Value Ref Range Status   Sodium 05/13/2022 139  135 - 145 mmol/L Final   Potassium 05/13/2022 3.6  3.5 - 5.1 mmol/L Final   Chloride 05/13/2022 107  98 - 111 mmol/L Final   CO2 05/13/2022 24  22 - 32 mmol/L Final   Glucose, Bld 05/13/2022 98  70 - 99 mg/dL Final   Glucose reference range applies only to samples taken after fasting for at least 8 hours.   BUN 05/13/2022 6  6 - 20 mg/dL Final    Creatinine, Ser 05/13/2022 0.75  0.44 - 1.00 mg/dL Final   Calcium 16/11/9602 8.6 (L)  8.9 - 10.3 mg/dL Final   GFR, Estimated 05/13/2022 >60  >60 mL/min Final   Comment: (NOTE) Calculated using the CKD-EPI Creatinine Equation (2021)    Anion gap 05/13/2022 8  5 - 15 Final   Performed at [facility], [address], [place], [place] [address]   WBC 05/13/2022 7.6  4.0 - 10.5 K/uL Final   RBC 05/13/2022 4.38  3.87 - 5.11 MIL/uL Final   Hemoglobin 05/13/2022 10.9 (L)  12.0 - 15.0 g/dL Final   HCT 11/91/4782 35.4 (L)  36.0 - 46.0 % Final   MCV 05/13/2022 80.8  80.0 - 100.0 fL Final   MCH 05/13/2022 24.9 (L)  26.0 - 34.0 pg Final   MCHC 05/13/2022 30.8  30.0 - 36.0 g/dL Final   RDW 95/62/1308 14.6  11.5 - 15.5 % Final   Platelets 05/13/2022 248  150 - 400 K/uL Final   nRBC 05/13/2022 0.0  0.0 - 0.2 % Final   Neutrophils Relative % 05/13/2022 60  % Final   Neutro Abs 05/13/2022 4.5  1.7 - 7.7 K/uL Final   Lymphocytes Relative 05/13/2022 32  % Final   Lymphs Abs 05/13/2022 2.4  0.7 - 4.0 K/uL Final   Monocytes Relative 05/13/2022 7  % Final   Monocytes Absolute 05/13/2022 0.5  0.1 - 1.0 K/uL Final   Eosinophils Relative 05/13/2022 1  % Final   Eosinophils Absolute 05/13/2022 0.1  0.0 - 0.5 K/uL Final   Basophils Relative 05/13/2022 0  % Final   Basophils Absolute 05/13/2022 0.0  0.0 - 0.1 K/uL Final   Immature Granulocytes 05/13/2022 0  % Final   Abs Immature Granulocytes 05/13/2022 0.03  0.00 - 0.07 K/uL Final   Performed at [facility], [address], [place], [place] [address]  Admission on 05/13/2022, Discharged on 05/13/2022  Component Date Value Ref Range Status   WBC 05/13/2022 10.1  4.0 -  10.5 K/uL Final   RBC 05/13/2022 4.70  3.87 - 5.11 MIL/uL Final   Hemoglobin 05/13/2022 11.9 (L)  12.0 - 15.0 g/dL Final   HCT 16/11/9602 38.4  36.0 - 46.0 % Final   MCV 05/13/2022 81.7  80.0 - 100.0 fL Final   MCH 05/13/2022 25.3 (L)  26.0 - 34.0 pg Final   MCHC 05/13/2022  31.0  30.0 - 36.0 g/dL Final   RDW 54/10/8117 14.8  11.5 - 15.5 % Final   Platelets 05/13/2022 266  150 - 400 K/uL Final   nRBC 05/13/2022 0.0  0.0 - 0.2 % Final   Performed at [facility], [address] [place], [place] [address]   Sodium 05/13/2022 140  135 - 145 mmol/L Final   Potassium 05/13/2022 3.5  3.5 - 5.1 mmol/L Final   Chloride 05/13/2022 104  98 - 111 mmol/L Final   CO2 05/13/2022 24  22 - 32 mmol/L Final   Glucose, Bld 05/13/2022 122 (H)  70 - 99 mg/dL Final   Glucose reference range applies only to samples taken after fasting for at least 8 hours.   BUN 05/13/2022 5 (L)  6 - 20 mg/dL Final   Creatinine, Ser 05/13/2022 0.85  0.44 - 1.00 mg/dL Final   Calcium 95/62/1308 9.0  8.9 - 10.3 mg/dL Final   Total Protein 65/78/4696 6.8  6.5 - 8.1 g/dL Final   Albumin 29/52/8413 3.6  3.5 - 5.0 g/dL Final   AST 24/40/1027 24  15 - 41 U/L Final   ALT 05/13/2022 19  0 - 44 U/L Final   Alkaline Phosphatase 05/13/2022 78  38 - 126 U/L Final   Total Bilirubin 05/13/2022 0.5  0.3 - 1.2 mg/dL Final   GFR, Estimated 05/13/2022 >60  >60 mL/min Final   Comment: (NOTE) Calculated using the CKD-EPI Creatinine Equation (2021)    Anion gap 05/13/2022 12  5 - 15 Final   Performed at [facility], [address], [place], [place] [address]   TSH 05/13/2022 1.353  0.350 - 4.500 uIU/mL Final   Comment: Performed by a 3rd Generation assay with a functional sensitivity of <=0.01 uIU/mL. Performed at [facility], [address] [place], [place] [address]    D-Dimer, Quant 05/13/2022 <0.27  0.00 - 0.50 ug/mL-FEU Final   Comment: (NOTE) At the manufacturer cut-off value of 0.5 g/mL FEU, this assay has a negative predictive value of 95-100%.This assay is intended for use in conjunction with a clinical pretest probability (PTP) assessment model to exclude pulmonary embolism (PE) and deep venous thrombosis (DVT) in outpatients suspected of PE or DVT. Results should be correlated  with clinical presentation. Performed at [facility], [address] [place], [place] [address]    I-stat hCG, quantitative 05/13/2022 <5.0  <5 mIU/mL Final   Comment 3 05/13/2022          Final   Comment:   GEST. AGE      CONC.  (mIU/mL)   <=1 WEEK        5 - 50     2 WEEKS       50 - 500     3 WEEKS       100 - 10,000     4 WEEKS     1,000 - 30,000        FEMALE AND NON-PREGNANT FEMALE:     LESS THAN 5 mIU/mL   Admission on 05/09/2022, Discharged on 05/10/2022  Component Date Value Ref Range Status   Sodium 05/10/2022 137  135 - 145 mmol/L Final   Potassium 05/10/2022 3.9  3.5 - 5.1 mmol/L Final   Chloride 05/10/2022 103  98 - 111 mmol/L Final   CO2 05/10/2022 20 (L)  22 - 32 mmol/L Final   Glucose, Bld 05/10/2022 119 (H)  70 - 99 mg/dL Final   Glucose reference range applies only to samples taken after fasting for at least 8 hours.   BUN 05/10/2022 7  6 - 20 mg/dL Final   Creatinine, Ser 05/10/2022 0.67  0.44 - 1.00 mg/dL Final   Calcium 40/98/1191 9.0  8.9 - 10.3 mg/dL Final   GFR, Estimated 05/10/2022 >60  >60 mL/min Final   Comment: (NOTE) Calculated using the CKD-EPI Creatinine Equation (2021)    Anion gap 05/10/2022 14  5 - 15 Final   Performed at [facility], [address], [place], [place] [address]   WBC 05/10/2022 9.1  4.0 - 10.5 K/uL Final   RBC 05/10/2022 4.52  3.87 - 5.11 MIL/uL Final   Hemoglobin 05/10/2022 11.5 (L)  12.0 - 15.0 g/dL Final   HCT 56/21/3086 37.2  36.0 - 46.0 % Final   MCV 05/10/2022 82.3  80.0 - 100.0 fL Final   MCH 05/10/2022 25.4 (L)  26.0 - 34.0 pg Final   MCHC 05/10/2022 30.9  30.0 - 36.0 g/dL Final   RDW 57/84/6962 14.5  11.5 - 15.5 % Final   Platelets 05/10/2022 283  150 - 400 K/uL Final   nRBC 05/10/2022 0.0  0.0 - 0.2 % Final   Performed at [facility], [address], [place], [place] [address]   Troponin I (High Sensitivity) 05/10/2022 <2  <18 ng/L Final   Comment: (NOTE) Elevated high sensitivity troponin I  (hsTnI) values and significant  changes across serial measurements may suggest ACS but many other  chronic and acute conditions are known to elevate hsTnI results.  Refer to the "Links" section for chest pain algorithms and additional  guidance. Performed at [facility], [address], [place], [place] [address]    Troponin I (High Sensitivity) 05/10/2022 2  <18 ng/L Final   Comment: (NOTE) Elevated high sensitivity troponin I (hsTnI) values and significant  changes across serial measurements may suggest ACS but many other  chronic and acute conditions are known to elevate hsTnI results.  Refer to the "Links" section for chest pain algorithms and additional  guidance. Performed at [facility], [address], [place], [place] [address]   Admission on 05/06/2022, Discharged on 05/06/2022  Component Date Value Ref Range Status   Sodium 05/06/2022 136  135 - 145 mmol/L Final   Potassium 05/06/2022 3.7  3.5 - 5.1 mmol/L Final   Chloride 05/06/2022 104  98 - 111 mmol/L Final   CO2 05/06/2022 22  22 - 32 mmol/L Final   Glucose, Bld 05/06/2022 102 (H)  70 - 99 mg/dL Final   Glucose reference range applies only to samples taken after fasting for at least 8 hours.   BUN 05/06/2022 14  6 - 20 mg/dL Final   Creatinine, Ser 05/06/2022 0.66  0.44 - 1.00 mg/dL Final   Calcium 25/36/6440 9.2  8.9 - 10.3 mg/dL Final   Total Protein 34/74/2595 8.1  6.5 - 8.1 g/dL Final   Albumin 63/87/5643 4.6  3.5 - 5.0 g/dL Final   AST 32/95/1884 17  15 - 41 U/L Final   ALT  05/06/2022 20  0 - 44 U/L Final   Alkaline Phosphatase 05/06/2022 69  38 - 126 U/L Final   Total Bilirubin 05/06/2022 0.6  0.3 - 1.2 mg/dL Final   GFR, Estimated 05/06/2022 >60  >60 mL/min Final   Comment: (NOTE) Calculated using the CKD-EPI Creatinine Equation (2021)    Anion gap 05/06/2022 10  5 - 15 Final   Performed at [facility], [address], [place], [place] [address]   Alcohol, Ethyl (B)  05/06/2022 <10  <10 mg/dL Final   Comment: (NOTE) Lowest detectable limit for serum alcohol is 10 mg/dL.  For medical purposes only. Performed at [facility], [address], [place], [place] [address]    Opiates 05/06/2022 NONE DETECTED  NONE DETECTED Final   Cocaine 05/06/2022 NONE DETECTED  NONE DETECTED Final   Benzodiazepines 05/06/2022 NONE DETECTED  NONE DETECTED Final   Amphetamines 05/06/2022 NONE DETECTED  NONE DETECTED Final   Tetrahydrocannabinol 05/06/2022 NONE DETECTED  NONE DETECTED Final   Barbiturates 05/06/2022 NONE DETECTED  NONE DETECTED Final   Comment: (NOTE) DRUG SCREEN FOR MEDICAL PURPOSES ONLY.  IF CONFIRMATION IS NEEDED FOR ANY PURPOSE, NOTIFY LAB WITHIN 5 DAYS.  LOWEST DETECTABLE LIMITS FOR URINE DRUG SCREEN Drug Class                     Cutoff (ng/mL) Amphetamine and metabolites    1000 Barbiturate and metabolites    200 Benzodiazepine                 200 Opiates and metabolites        300 Cocaine and metabolites        300 THC                            50 Performed at [facility], [address], [place], [place] [address]    WBC 05/06/2022 10.0  4.0 - 10.5 K/uL Final   RBC 05/06/2022 4.99  3.87 - 5.11 MIL/uL Final   Hemoglobin 05/06/2022 12.6  12.0 - 15.0 g/dL Final   HCT 91/47/8295 40.3  36.0 - 46.0 % Final   MCV 05/06/2022 80.8  80.0 - 100.0 fL Final   MCH 05/06/2022 25.3 (L)  26.0 - 34.0 pg Final   MCHC 05/06/2022 31.3  30.0 - 36.0 g/dL Final   RDW 62/13/0865 14.4  11.5 - 15.5 % Final   Platelets 05/06/2022 307  150 - 400 K/uL Final   nRBC 05/06/2022 0.0  0.0 - 0.2 % Final   Neutrophils Relative % 05/06/2022 63  % Final   Neutro Abs 05/06/2022 6.2  1.7 - 7.7 K/uL Final   Lymphocytes Relative 05/06/2022 31  % Final   Lymphs Abs 05/06/2022 3.1  0.7 - 4.0 K/uL Final   Monocytes Relative 05/06/2022 5  % Final   Monocytes Absolute 05/06/2022 0.5  0.1 - 1.0 K/uL Final   Eosinophils Relative 05/06/2022  1  % Final   Eosinophils Absolute 05/06/2022 0.1  0.0 - 0.5 K/uL Final   Basophils Relative 05/06/2022 0  % Final   Basophils Absolute 05/06/2022 0.0  0.0 - 0.1 K/uL Final   Immature Granulocytes 05/06/2022 0  % Final   Abs Immature Granulocytes 05/06/2022 0.02  0.00 - 0.07 K/uL Final   Performed at [facility], [address] [place], [place] [address]   Preg Test, Ur 05/06/2022 NEGATIVE  NEGATIVE  Final   Comment:        THE SENSITIVITY OF THIS METHODOLOGY IS >20 mIU/mL. Performed at [facility], [address], [place], [place] [address]   Admission on 05/03/2022, Discharged on 05/04/2022  Component Date Value Ref Range Status   WBC 05/04/2022 8.2  4.0 - 10.5 K/uL Final   RBC 05/04/2022 4.89  3.87 - 5.11 MIL/uL Final   Hemoglobin 05/04/2022 12.5  12.0 - 15.0 g/dL Final   HCT 29/56/2130 40.0  36.0 - 46.0 % Final   MCV 05/04/2022 81.8  80.0 - 100.0 fL Final   MCH 05/04/2022 25.6 (L)  26.0 - 34.0 pg Final   MCHC 05/04/2022 31.3  30.0 - 36.0 g/dL Final   RDW 86/57/8469 14.6  11.5 - 15.5 % Final   Platelets 05/04/2022 290  150 - 400 K/uL Final   nRBC 05/04/2022 0.0  0.0 - 0.2 % Final   Performed at [facility], [address], [place], [place] [address]   Sodium 05/04/2022 137  135 - 145 mmol/L Final   Potassium 05/04/2022 3.4 (L)  3.5 - 5.1 mmol/L Final   Chloride 05/04/2022 107  98 - 111 mmol/L Final   CO2 05/04/2022 22  22 - 32 mmol/L Final   Glucose, Bld 05/04/2022 103 (H)  70 - 99 mg/dL Final   Glucose reference range applies only to samples taken after fasting for at least 8 hours.   BUN 05/04/2022 12  6 - 20 mg/dL Final   Creatinine, Ser 05/04/2022 0.69  0.44 - 1.00 mg/dL Final   Calcium 84/13/2440 8.8 (L)  8.9 - 10.3 mg/dL Final   Total Protein 12/02/2534 7.5  6.5 - 8.1 g/dL Final   Albumin 64/40/3474 4.3  3.5 - 5.0 g/dL Final   AST 25/95/6387 19  15 - 41 U/L Final   ALT 05/04/2022 21  0 - 44 U/L Final   Alkaline  Phosphatase 05/04/2022 66  38 - 126 U/L Final   Total Bilirubin 05/04/2022 0.4  0.3 - 1.2 mg/dL Final   GFR, Estimated 05/04/2022 >60  >60 mL/min Final   Comment: (NOTE) Calculated using the CKD-EPI Creatinine Equation (2021)    Anion gap 05/04/2022 8  5 - 15 Final   Performed at [facility], [address] [place], [place] [address]   Opiates 05/03/2022 NONE DETECTED  NONE DETECTED Final   Cocaine 05/03/2022 NONE DETECTED  NONE DETECTED Final   Benzodiazepines 05/03/2022 NONE DETECTED  NONE DETECTED Final   Amphetamines 05/03/2022 NONE DETECTED  NONE DETECTED Final   Tetrahydrocannabinol 05/03/2022 NONE DETECTED  NONE DETECTED Final   Barbiturates 05/03/2022 NONE DETECTED  NONE DETECTED Final   Comment: (NOTE) DRUG SCREEN FOR MEDICAL PURPOSES ONLY.  IF CONFIRMATION IS NEEDED FOR ANY PURPOSE, NOTIFY LAB WITHIN 5 DAYS.  LOWEST DETECTABLE LIMITS FOR URINE DRUG SCREEN Drug Class                     Cutoff (ng/mL) Amphetamine and metabolites    1000 Barbiturate and metabolites    200 Benzodiazepine                 200 Opiates and metabolites        300 Cocaine and metabolites        300 THC                            50 Performed  at [facility] [address] [place], [place] [address]    Alcohol, Ethyl (B) 05/04/2022 <10  <10 mg/dL Final   Comment: (NOTE) Lowest detectable limit for serum alcohol is 10 mg/dL.  For medical purposes only. Performed at [facility], [address], [place], [place] [address]    I-stat hCG, quantitative 05/04/2022 <5.0  <5 mIU/mL Final   Comment 3 05/04/2022          Final   Comment:   GEST. AGE      CONC.  (mIU/mL)   <=1 WEEK        5 - 50     2 WEEKS       50 - 500     3 WEEKS       100 - 10,000     4 WEEKS     1,000 - 30,000        FEMALE AND NON-PREGNANT FEMALE:     LESS THAN 5 mIU/mL    SARS Coronavirus 2 by RT PCR 05/04/2022 NEGATIVE  NEGATIVE Final   Comment:  (NOTE) SARS-CoV-2 target nucleic acids are NOT DETECTED.  The SARS-CoV-2 RNA is generally detectable in upper respiratory specimens during the acute phase of infection. The lowest concentration of SARS-CoV-2 viral copies this assay can detect is 138 copies/mL. A negative result does not preclude SARS-Cov-2 infection and should not be used as the sole basis for treatment or other patient management decisions. A negative result may occur with  improper specimen collection/handling, submission of specimen other than nasopharyngeal swab, presence of viral mutation(s) within the areas targeted by this assay, and inadequate number of viral copies(<138 copies/mL). A negative result must be combined with clinical observations, patient history, and epidemiological information. The expected result is Negative.  Fact Sheet for Patients:  BloggerCourse.com  Fact Sheet for Healthcare Providers:  SeriousBroker.it  This test is no                          t yet approved or cleared by the [place] FDA and  has been authorized for detection and/or diagnosis of SARS-CoV-2 by FDA under an Emergency Use Authorization (EUA). This EUA will remain  in effect (meaning this test can be used) for the duration of the COVID-19 declaration under Section 564(b)(1) of the Act, 21 U.S.C.section 360bbb-3(b)(1), unless the authorization is terminated  or revoked sooner.       Influenza A by PCR 05/04/2022 NEGATIVE  NEGATIVE Final   Influenza B by PCR 05/04/2022 NEGATIVE  NEGATIVE Final   Comment: (NOTE) The Xpert Xpress SARS-CoV-2/FLU/RSV plus assay is intended as an aid in the diagnosis of influenza from Nasopharyngeal swab specimens and should not be used as a sole basis for treatment. Nasal washings and aspirates are unacceptable for Xpert Xpress SARS-CoV-2/FLU/RSV testing.  Fact Sheet for Patients: BloggerCourse.com  Fact  Sheet for Healthcare Providers: SeriousBroker.it  This test is not yet approved or cleared by the [place] FDA and has been authorized for detection and/or diagnosis of SARS-CoV-2 by FDA under an Emergency Use Authorization (EUA). This EUA will remain in effect (meaning this test can be used) for the duration of the COVID-19 declaration under Section 564(b)(1) of the Act, 21 U.S.C. section 360bbb-3(b)(1), unless the authorization is terminated or revoked.     Resp Syncytial Virus by PCR 05/04/2022 NEGATIVE  NEGATIVE Final   Comment: (NOTE) Fact Sheet for Patients: BloggerCourse.com  Fact Sheet  for Healthcare Providers: SeriousBroker.it  This test is not yet approved or cleared by the [place] and has been authorized for detection and/or diagnosis of SARS-CoV-2 by FDA under an Emergency Use Authorization (EUA). This EUA will remain in effect (meaning this test can be used) for the duration of the COVID-19 declaration under Section 564(b)(1) of the Act, 21 U.S.C. section 360bbb-3(b)(1), unless the authorization is terminated or revoked.  Performed at [facility], [address] [place], [place] [address]   There may be more visits with results that are not included.    Allergies: Bee venom, Coconut flavor, Fish allergy, Geodon [ziprasidone hcl], Haloperidol and related, Lithobid [lithium], Roxicodone [oxycodone], Seroquel [quetiapine], Shellfish allergy, Phenergan [promethazine hcl], Prilosec [omeprazole], Sulfa antibiotics, Tegretol [carbamazepine], Prozac [fluoxetine], Tape, and Tylenol [acetaminophen]  Medications:  Facility Ordered Medications  Medication   alum & mag hydroxide-simeth (MAALOX/MYLANTA) 200-200-20 MG/5ML suspension 30 mL   magnesium hydroxide (MILK OF MAGNESIA) suspension 30 mL   hydrOXYzine (ATARAX) tablet 25 mg   albuterol (VENTOLIN HFA) 108 (90  Base) MCG/ACT inhaler 2 puff   cyclobenzaprine (FLEXERIL) tablet 10 mg   escitalopram (LEXAPRO) tablet 20 mg   pantoprazole (PROTONIX) EC tablet 40 mg   traZODone (DESYREL) tablet 50 mg   gabapentin (NEURONTIN) capsule 600 mg   prazosin (MINIPRESS) capsule 1 mg   ibuprofen (ADVIL) tablet 600 mg   OLANZapine zydis (ZYPREXA) disintegrating tablet 10 mg   PTA Medications  Medication Sig   albuterol (VENTOLIN HFA) 108 (90 Base) MCG/ACT inhaler Inhale 2 puffs into the lungs every 6 (six) hours as needed for wheezing or shortness of breath.   prazosin (MINIPRESS) 1 MG capsule Take 1 capsule (1 mg total) by mouth at bedtime for 14 days.   escitalopram (LEXAPRO) 10 MG tablet Take 1 tablet (10 mg total) by mouth daily for 14 days.   pantoprazole (PROTONIX) 40 MG tablet Take 1 tablet (40 mg total) by mouth daily for 14 days. (Patient taking differently: Take 40 mg by mouth daily before breakfast.)   pregabalin (LYRICA) 100 MG capsule Take 100 mg by mouth 3 (three) times daily.   BREYNA 160-4.5 MCG/ACT inhaler Inhale 1 puff into the lungs in the morning and at bedtime.   traZODone (DESYREL) 50 MG tablet Take 1 tablet (50 mg total) by mouth at bedtime as needed for up to 7 days for sleep. (Patient taking differently: Take 50 mg by mouth at bedtime.)   CORLANOR 5 MG TABS tablet Take 5 mg by mouth 2 (two) times daily.   cyclobenzaprine (FLEXERIL) 10 MG tablet Take 10 mg by mouth at bedtime.   diclofenac Sodium (VOLTAREN) 1 % GEL Apply 2 g topically 2 (two) times daily.   hydrOXYzine (ATARAX) 25 MG tablet Take 1 tablet (25 mg total) by mouth 3 (three) times daily as needed for anxiety.   melatonin 3 MG TABS tablet Take 1 tablet (3 mg total) by mouth at bedtime.   cariprazine (VRAYLAR) 1.5 MG capsule Take 1 capsule (1.5 mg total) by mouth daily. (Patient not taking: Reported on 06/16/2022)   OLANZapine (ZYPREXA) 5 MG tablet Take 5 mg by mouth at bedtime.   Cariprazine HCl (VRAYLAR) 4.5 MG CAPS Take 1  capsule by mouth daily.   escitalopram (LEXAPRO) 10 MG tablet Take 10 mg by mouth daily.   pantoprazole (PROTONIX) 40 MG tablet Take 40 mg by mouth daily.   prazosin (MINIPRESS) 1 MG capsule Take 1 mg by mouth at bedtime.  traZODone (DESYREL) 50 MG tablet Take 50 mg by mouth at bedtime.      Medical Decision Making   Patient admitted to the [facility] behavioral health continuous assessment unit for overnight observation for medication management and SI.  Patient is voluntary. Patient will most likely discharge on 06/23/2022.  Lab Orders         CBC with Differential/Platelet         Comprehensive metabolic panel         Ethanol         POC urine preg, ED         POCT Urine Drug Screen - (I-Screen)    EKG  Medications  Meds ordered this encounter  Medications   alum & mag hydroxide-simeth (MAALOX/MYLANTA) 200-200-20 MG/5ML suspension 30 mL   magnesium hydroxide (MILK OF MAGNESIA) suspension 30 mL   hydrOXYzine (ATARAX) tablet 25 mg   albuterol (VENTOLIN HFA) 108 (90 Base) MCG/ACT inhaler 2 puff   cyclobenzaprine (FLEXERIL) tablet 10 mg   escitalopram (LEXAPRO) tablet 20 mg   pantoprazole (PROTONIX) EC tablet 40 mg   traZODone (DESYREL) tablet 50 mg   gabapentin (NEURONTIN) capsule 600 mg   prazosin (MINIPRESS) capsule 1 mg   ibuprofen (ADVIL) tablet 600 mg   OLANZapine zydis (ZYPREXA) disintegrating tablet 10 mg     Recommendations  Based on my evaluation the patient does not appear to have an emergency medical condition.  Layla Barter, NP 06/22/22  6:07 PM
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
## 2022-06-22 NOTE — ED Notes (Signed)
Pt sleeping@this time. Breathing even and unlabored. Will continue to monitor for safety 

## 2022-06-22 NOTE — ED Notes (Signed)
Pt A&O x 4, presents with suicidal ideations after  being unable to get Prescriptions filled, post DC from [facility].  Pt denies HI or AVH.  Passive SI noted.  Monitoring for safety.  Comfort measures given.

## 2022-06-23 DIAGNOSIS — R45851 Suicidal ideations: Secondary | ICD-10-CM

## 2022-06-23 DIAGNOSIS — F411 Generalized anxiety disorder: Secondary | ICD-10-CM

## 2022-06-23 DIAGNOSIS — F250 Schizoaffective disorder, bipolar type: Secondary | ICD-10-CM | POA: Diagnosis not present

## 2022-06-23 MED ORDER — OLANZAPINE 10 MG PO TBDP: 10.0000 mg | ORAL_TABLET | Freq: Every day | ORAL | Status: DC

## 2022-06-23 MED ORDER — GABAPENTIN 300 MG PO CAPS: 600.0000 mg | ORAL_CAPSULE | Freq: Three times a day (TID) | ORAL | Status: DC

## 2022-06-23 MED ORDER — ESCITALOPRAM OXALATE 20 MG PO TABS: 20.0000 mg | ORAL_TABLET | Freq: Every day | ORAL | Status: DC

## 2022-06-23 NOTE — ED Notes (Signed)
Pt sleeping@this time. Breathing even and unlabored. Will continue to monitor for safety 

## 2022-06-23 NOTE — ED Provider Notes (Addendum)
FBC/OBS ASAP Discharge Summary  Date and Time: 06/23/2022 10:21 AM  Name: [name]  MRN:  [id number]   Discharge Diagnoses:  Final diagnoses:  Schizoaffective disorder, bipolar type [facility])  Anxiety state  Suicidal ideation    Subjective: [name] seen and evaluated face-to-face by this provider, chart reviewed and case discussed with Dr. Sherron Flemings. On evaluation, [name] is alert and oriented x 4. [name] thought process is linear and speech is clear and coherent at a moderate tone. [name] mood is euthymic and affect is congruent.  [name] denies suicidal ideations. [name] denies recent self harm behaviors. [name] verbally contracts for safety to return home today. [name] denies homicidal ideations. [name] denies auditory or visual hallucinations. There is no objective evidence that the [name] is currently responding to internal or external stimuli. [name] denies depressive symptoms today.  [name] reports fair sleep. [name] reports a fair appetite. [name] reports decreased anxiety today. [name] continues to voice concerns about getting [name] prescriptions sent to the pharmacy. I discussed with the [name] that I was able to speak to the house supervisor Kathie Rhodes, at [facility] who confirmed that the provider will e-prescribe the medications to the [place] [facility] on [address] today and to give them about 2 hours to send. [name] verbalizes understanding. [name] states that [name] is ready to go home and will call the pharmacy in two hours to confirm medications were sent. [name] advised to contact this provider if the medications are not at the pharmacy in two hours. [name] states that [name] next follow-up appointment with [facility] is on May 29. [name] denies access to weapons. Safety planning completed. [name] gives verbal consent for this provider to speak with [name] roommate Fayrene Fearing [phone] to safety plan. Fayrene Fearing, confirms no firearms are in the home. He was advised that due to the [name]'s history of OD all medications should be locked up  and monitored. As safety precautions, all sharp objects and hazardous chemical should also be removed and locked up. If [name]'s symptoms worsen or [name] expresses active SI have the [name] go to the [facility], nearest ED or call 911 for an evaluation. James verbalizes understanding and agrees to the stated plan. Although this [name] presented anxious and with passive SI [name] does not appear to be at imminent risk of dangerousness to self and dangerousness to others at this time. While future psychiatric events cannot be accurately predicted, the [name] does not necessitate nor desire further acute inpatient psychiatric care at this time. This [name] presents with risk factors that include impulsive behaviors, past suicide attempts and self injurious behaviors. These are mitigated by protective factors which include lack of active SI/HI, sense of responsibility to family, states that [name] gets to visit [name] 83 y.o. daughter next Saturday, presence of an available support system-established with [employer], enjoyment of leisure actvities-states that [name] enjoys volunteering at [facility] hospital on Thursdays, expresses purpose for living, and effective problem solving skills by contacting the BHRT or CST if [name] becomes actively suicidal.   Stay Summary: Carrell Kneib is a 32 year old female [name] with a past psychiatric history significant for borderline personality disorder, schizoaffective disorder, bipolar type, PTSD, GAD, self-injurious behaviors, and past suicide attempts who presents to the [facility] behavioral health urgent care voluntary accompanied by the [facility].    [name] seen and evaluated face-to-face by this provider on 06/22/22, chart reviewed and case discussed with Dr. Lucianne Muss. On evaluation, [name] is alert and oriented x 4. [name] thought process is linear and speech is clear  and coherent. [name] mood is anxious and affect is congruent. [name] has fair eye contact. [name] appears well groomed and is  casually dressed. [name] is calm and cooperative and does not appear to be in acute distress. [name] states that [name] was discharged from [facility] yesterday and while [name] was there they made medication adjustments, increased gabapentin to 600 mg 3 times daily, Zyprexa to 10 mg nightly, and Lexapro to 20 mg daily. [name] states that [name] went to pick up [name] medications at the [facility] but they were not there and then [name] went to [facility] according to the discharge paperwork but the medications were not there. [name] states that herself and the CST tried to contact [facility] but was unable to get in contact with him. [name] provides this provider with [name] discharge papers from [facility] with [name] medication list. [name] states that [name] has not been able to take [name] medications today which has made [name] increasingly anxious and suicidal. [name] denies a suicide plan or intent. [name] rates [name] anxiety at 8 out of 10 with 10 being the worst. [name] describes [name] symptoms as palpitations, sweats, nervousness, and racing thoughts. [name] denies homicidal ideations. [name] denies auditory or visual hallucinations. There is no objective evidence that the [name] is currently responding to internal or external stimuli. [name] denies using illicit drugs or alcohol. [name] reports outpatient psychiatry with [facility] CST. [name] states that [name] will not be able to see Dr. Freida Busman until 5/29 for medication management. [name] is hopeful today to be able to get prescriptions for [name] medications. This provider contacted the [facility] pharmacy on [address] who confirmed that they did not receive electronic prescriptions from [facility] and that they were unable to get in contact with [facility] to verify prescriptions. I discussed admission to the continuous assessment unit for overnight due to SI and this provider will consult with the attending if prescriptions or sample medications can be provided at the time of discharge on 06/23/2022.   Total Time spent with  [name]: 30 minutes  Past Psychiatric History: History of borderline personality disorder, schizoaffective disorder, bipolar type, PTSD, GAD, self-injurious behaviors, and past suicide attempts. [name] recently hospitalized at [facility] from 06/18/22-06/21/2022.   Past Medical History: History of fibromyalgia and asthma   Family History: No history reported.   Family Psychiatric History:   Social History:[name] resides with [name] ex-boyfriend Fayrene Fearing. [name] denies drinking alcohol or using illicit drugs. [name] reports volunteering at [facility]. [name] has a daughter that is not in [name] custody.   Tobacco Cessation:  N/A, [name] does not currently use tobacco products  Current Medications:  Current Facility-Administered Medications  Medication Dose Route Frequency Provider Last Rate Last Admin   albuterol (VENTOLIN HFA) 108 (90 Base) MCG/ACT inhaler 2 puff  2 puff Inhalation Q6H PRN Arianne Klinge L, NP       alum & mag hydroxide-simeth (MAALOX/MYLANTA) 200-200-20 MG/5ML suspension 30 mL  30 mL Oral Q4H PRN Deandrew Hoecker L, NP       cyclobenzaprine (FLEXERIL) tablet 10 mg  10 mg Oral QHS Cameran Pettey L, NP   10 mg at 06/22/22 2039   escitalopram (LEXAPRO) tablet 20 mg  20 mg Oral Daily Tyden Kann L, NP   20 mg at 06/23/22 0923   gabapentin (NEURONTIN) capsule 600 mg  600 mg Oral TID Yobani Schertzer L, NP   600 mg at 06/23/22 0923   hydrOXYzine (ATARAX) tablet 25 mg  25 mg Oral  TID PRN Liborio Nixon L, NP   25 mg at 06/22/22 2038   ibuprofen (ADVIL) tablet 600 mg  600 mg Oral Q6H PRN Luisdaniel Kenton L, NP   600 mg at 06/22/22 2053   magnesium hydroxide (MILK OF MAGNESIA) suspension 30 mL  30 mL Oral Daily PRN Geffrey Michaelsen L, NP       neomycin-bacitracin-polymyxin [phone] OINT 1 Application  1 Application Topical Daily Ajibola, Ene A, NP   1 Application at 06/22/22 2037   OLANZapine zydis (ZYPREXA) disintegrating tablet 10 mg  10 mg Oral QHS Jacqulynn Shappell L,  NP   10 mg at 06/22/22 2038   pantoprazole (PROTONIX) EC tablet 40 mg  40 mg Oral Daily Lauri Purdum L, NP   40 mg at 06/23/22 5784   prazosin (MINIPRESS) capsule 1 mg  1 mg Oral QHS Ad Guttman L, NP   1 mg at 06/22/22 2038   traZODone (DESYREL) tablet 50 mg  50 mg Oral QHS PRN Jemya Depierro L, NP   50 mg at 06/22/22 2038   Current Outpatient Medications  Medication Sig Dispense Refill   albuterol (VENTOLIN HFA) 108 (90 Base) MCG/ACT inhaler Inhale 2 puffs into the lungs every 6 (six) hours as needed for wheezing or shortness of breath.     BREYNA 160-4.5 MCG/ACT inhaler Inhale 1 puff into the lungs in the morning and at bedtime.     cyclobenzaprine (FLEXERIL) 10 MG tablet Take 10 mg by mouth at bedtime.     diclofenac Sodium (VOLTAREN) 1 % GEL Apply 2 g topically 2 (two) times daily. 2 g 0   [START ON 06/24/2022] escitalopram (LEXAPRO) 20 MG tablet Take 1 tablet (20 mg total) by mouth daily.     gabapentin (NEURONTIN) 300 MG capsule Take 2 capsules (600 mg total) by mouth 3 (three) times daily.     hydrOXYzine (ATARAX) 25 MG tablet Take 1 tablet (25 mg total) by mouth 3 (three) times daily as needed for anxiety. 30 tablet 0   melatonin 3 MG TABS tablet Take 1 tablet (3 mg total) by mouth at bedtime. 30 tablet 0   OLANZapine zydis (ZYPREXA) 10 MG disintegrating tablet Take 1 tablet (10 mg total) by mouth at bedtime.     pantoprazole (PROTONIX) 40 MG tablet Take 40 mg by mouth daily.     prazosin (MINIPRESS) 1 MG capsule Take 1 mg by mouth at bedtime.     traZODone (DESYREL) 50 MG tablet Take 1 tablet (50 mg total) by mouth at bedtime as needed for up to 7 days for sleep. ([name] taking differently: Take 50 mg by mouth at bedtime.) 7 tablet 0    PTA Medications:  Facility Ordered Medications  Medication   alum & mag hydroxide-simeth (MAALOX/MYLANTA) 200-200-20 MG/5ML suspension 30 mL   magnesium hydroxide (MILK OF MAGNESIA) suspension 30 mL   hydrOXYzine (ATARAX) tablet 25 mg    albuterol (VENTOLIN HFA) 108 (90 Base) MCG/ACT inhaler 2 puff   cyclobenzaprine (FLEXERIL) tablet 10 mg   escitalopram (LEXAPRO) tablet 20 mg   pantoprazole (PROTONIX) EC tablet 40 mg   traZODone (DESYREL) tablet 50 mg   gabapentin (NEURONTIN) capsule 600 mg   prazosin (MINIPRESS) capsule 1 mg   ibuprofen (ADVIL) tablet 600 mg   OLANZapine zydis (ZYPREXA) disintegrating tablet 10 mg   neomycin-bacitracin-polymyxin [phone] OINT 1 Application   PTA Medications  Medication Sig   albuterol (VENTOLIN HFA) 108 (90 Base) MCG/ACT inhaler Inhale 2 puffs into the lungs every 6 (six)  hours as needed for wheezing or shortness of breath.   BREYNA 160-4.5 MCG/ACT inhaler Inhale 1 puff into the lungs in the morning and at bedtime.   cyclobenzaprine (FLEXERIL) 10 MG tablet Take 10 mg by mouth at bedtime.   diclofenac Sodium (VOLTAREN) 1 % GEL Apply 2 g topically 2 (two) times daily.   hydrOXYzine (ATARAX) 25 MG tablet Take 1 tablet (25 mg total) by mouth 3 (three) times daily as needed for anxiety.   melatonin 3 MG TABS tablet Take 1 tablet (3 mg total) by mouth at bedtime.   pantoprazole (PROTONIX) 40 MG tablet Take 40 mg by mouth daily.   prazosin (MINIPRESS) 1 MG capsule Take 1 mg by mouth at bedtime.   [START ON 06/24/2022] escitalopram (LEXAPRO) 20 MG tablet Take 1 tablet (20 mg total) by mouth daily.   gabapentin (NEURONTIN) 300 MG capsule Take 2 capsules (600 mg total) by mouth 3 (three) times daily.   OLANZapine zydis (ZYPREXA) 10 MG disintegrating tablet Take 1 tablet (10 mg total) by mouth at bedtime.       03/10/2022   10:01 PM 03/10/2022    9:56 PM 02/10/2022    1:49 AM  Depression screen PHQ 2/9  Decreased Interest 1 1 2   Down, Depressed, Hopeless 1 1 2   PHQ - 2 Score 2 2 4   Altered sleeping 1 1 2   Tired, decreased energy 1 1 2   Change in appetite 1 0 2  Feeling bad or failure about yourself  1 1 2   Trouble concentrating 1 1 2   Moving slowly or fidgety/restless 1 1 1   Suicidal  thoughts 1 1 1   PHQ-9 Score 9 8 16   Difficult doing work/chores Somewhat difficult  Very difficult    Flowsheet Row ED from 06/22/2022 in [facility] ED from 06/16/2022 in [facility] Emergency Department at [facility] Admission (Discharged[facility]  C-SSRS RISK CATEGORY High Risk High Risk Moderate Risk       Musculoskeletal  Strength & Muscle Tone: within normal limits Gait & Station: normal [name] leans: N/A  Psychiatric Specialty Exam  Presentation  General Appearance:  Appropriate for Environment  Eye Contact: Fair  Speech: Clear and Coherent  Speech Volume: Normal  Handedness: Right   Mood and Affect  Mood: Euthymic  Affect: Congruent   Thought Process  Thought Processes: Coherent  Descriptions of Associations:Intact  Orientation:Full (Time, Place and Person)  Thought Content:Logical  Diagnosis of Schizophrenia or Schizoaffective disorder in past: Yes  Duration of Psychotic Symptoms: Greater than six months   Hallucinations:Hallucinations: None  Ideas of Reference:None  Suicidal Thoughts:Suicidal Thoughts: No SI Passive Intent and/or Plan: Without Intent; Without Plan  Homicidal Thoughts:Homicidal Thoughts: No   Sensorium  Memory: Immediate Fair; Recent Fair; Remote Fair  Judgment: Fair  Insight: Fair   Chartered certified accountant: Fair  Attention Span: Fair  Recall: [employer] of Knowledge: Fair  Language: Fair   Psychomotor Activity  Psychomotor Activity: Psychomotor Activity: Normal   Assets  Assets: Communication Skills; Desire for Improvement; Financial Resources/Insurance; Housing; Leisure Time; Physical Health; Social Support; Vocational/Educational   Sleep  Sleep: Sleep: Fair   Nutritional Assessment (For OBS and FBC admissions only) Has the [name] had a weight loss or gain of 10 pounds or more in  the last 3 months?: No Has the [name] had a decrease in food intake/or appetite?: No Does the [name] have dental problems?: No Does the  [name] have eating habits or behaviors that may be indicators of an eating disorder including binging or inducing vomiting?: No Has the [name] recently lost weight without trying?: 0 Has the [name] been eating poorly because of a decreased appetite?: 0 Malnutrition Screening Tool Score: 0    Physical Exam  Physical Exam Constitutional:      Appearance: Normal appearance.  HENT:     Head: Normocephalic.  Eyes:     Conjunctiva/sclera: Conjunctivae normal.  Cardiovascular:     Rate and Rhythm: Normal rate.  Pulmonary:     Effort: Pulmonary effort is normal.  Musculoskeletal:        General: Normal range of motion.     Cervical back: Normal range of motion.  Neurological:     Mental Status: [name] is alert and oriented to person, place, and time.    Review of Systems  Constitutional: Negative.   HENT: Negative.    Eyes: Negative.   Respiratory: Negative.    Cardiovascular: Negative.   Neurological: Negative.   Endo/Heme/Allergies: Negative.    Blood pressure 109/79, pulse 84, temperature 98.6 F (37 C), temperature source Oral, resp. rate 17, last menstrual period 04/25/2022, SpO2 99 %. There is no height or weight on file to calculate BMI.  Demographic Factors:  Adolescent or young adult and Low socioeconomic status  Loss Factors: NA  Historical Factors: Prior suicide attempts and Impulsivity  Risk Reduction Factors:   Responsible for children under 50 years of age, Sense of responsibility to family, Living with another person, especially a relative, and Positive social support  Continued Clinical Symptoms:  Previous Psychiatric Diagnoses and Treatments  Cognitive Features That Contribute To Risk:  None    Suicide Risk:  Minimal: No identifiable suicidal ideation.  Patients presenting with no risk factors but with morbid  ruminations; may be classified as minimal risk based on the severity of the depressive symptoms  Plan Of Care/Follow-up recommendations:  Discharge recommendations:   Medications: [name] has agreed to e-prescribed your discharge medications to the [facility] on [address]. [name] is to take medications as prescribed. The [name] or [name]'s guardian is to contact a medical professional and/or outpatient provider to address any new side effects that develop. The [name] or the [name]'s guardian should update outpatient providers of any new medications and/or medication changes.   Outpatient Follow up: Follow up with [facility] for medication management and community support.   Therapy: We recommend that [name] participate in individual therapy to address mental health concerns.  Atypical antipsychotics: If you are prescribed an atypical antipsychotic, it is recommended that your height, weight, BMI, blood pressure, fasting lipid panel, and fasting blood sugar be monitored by your outpatient providers.  Safety:   The following safety precautions should be taken:   No sharp objects. This includes scissors, razors, scrapers, and putty knives.   Chemicals should be removed and locked up.   Medications should be removed and locked up.   Weapons should be removed and locked up. This includes firearms, knives and instruments that can be used to cause injury.   The [name] should abstain from use of illicit substances/drugs and abuse of any medications.  If symptoms worsen or do not continue to improve or if the [name] becomes actively suicidal or homicidal then it is recommended that the [name] return to the closest hospital emergency department, the [facility], or call 911 for further evaluation and treatment. National Suicide Prevention Lifeline 1-800-SUICIDE or (769) 122-4503.  About 988 988  offers 24/7 access to trained crisis counselors who can  help people experiencing mental health-related distress. People can call or text 988 or chat 988lifeline.org for themselves or if they are worried about a loved one who may need crisis support.   Disposition: Discharge home. Transport via taxi.   Luman Holway L, NP 06/23/2022, 10:21 AM

## 2022-06-23 NOTE — ED Notes (Signed)
Discharged home with [facility]S. Denies current SI/HI/AVH. Patient contracts for safety. Pt advised to contact her pharmacy in 1-2 hours to confirm patients meds are there. Transportation provided by taxi.

## 2022-06-23 NOTE — Discharge Instructions (Signed)
Discharge recommendations:   Medications: [name] has agreed to e-prescribed your discharge medications to the [facility] on [address]. Patient is to take medications as prescribed. The patient or patient's guardian is to contact a medical professional and/or outpatient provider to address any new side effects that develop. The patient or the patient's guardian should update outpatient providers of any new medications and/or medication changes.   Outpatient Follow up: Follow up with [facility] for medication management and community support.   Therapy: We recommend that patient participate in individual therapy to address mental health concerns.  Atypical antipsychotics: If you are prescribed an atypical antipsychotic, it is recommended that your height, weight, BMI, blood pressure, fasting lipid panel, and fasting blood sugar be monitored by your outpatient providers.  Safety:   The following safety precautions should be taken:   No sharp objects. This includes scissors, razors, scrapers, and putty knives.   Chemicals should be removed and locked up.   Medications should be removed and locked up.   Weapons should be removed and locked up. This includes firearms, knives and instruments that can be used to cause injury.   The patient should abstain from use of illicit substances/drugs and abuse of any medications.  If symptoms worsen or do not continue to improve or if the patient becomes actively suicidal or homicidal then it is recommended that the patient return to the closest hospital emergency department, the [facility], or call 911 for further evaluation and treatment. National Suicide Prevention Lifeline 1-800-SUICIDE or 867-642-0507.  About 988 988 offers 24/7 access to trained crisis counselors who can help people experiencing mental health-related distress. People can call or text 988 or chat 988lifeline.org for themselves or if they are  worried about a loved one who may need crisis support.
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
## 2022-06-28 ENCOUNTER — Emergency Department (HOSPITAL_COMMUNITY)
Admission: EM | Admit: 2022-06-28 | Discharge: 2022-06-29 | Disposition: A | Discharge status: Home / Self Care | Payer: Medicare PPO | Attending: Emergency Medicine | Admitting: Emergency Medicine

## 2022-06-28 DIAGNOSIS — M25561 Pain in right knee: Secondary | ICD-10-CM | POA: Diagnosis present

## 2022-06-28 DIAGNOSIS — W01198A Fall on same level from slipping, tripping and stumbling with subsequent striking against other object, initial encounter: Secondary | ICD-10-CM | POA: Diagnosis not present

## 2022-06-28 DIAGNOSIS — J45909 Unspecified asthma, uncomplicated: Secondary | ICD-10-CM | POA: Insufficient documentation

## 2022-06-28 DIAGNOSIS — S8001XA Contusion of right knee, initial encounter: Secondary | ICD-10-CM

## 2022-06-29 ENCOUNTER — Encounter (HOSPITAL_COMMUNITY): Payer: Self-pay

## 2022-06-29 ENCOUNTER — Other Ambulatory Visit: Payer: Self-pay

## 2022-06-29 ENCOUNTER — Emergency Department (HOSPITAL_COMMUNITY): Payer: Medicare PPO

## 2022-06-29 MED ORDER — KETOROLAC TROMETHAMINE 15 MG/ML IJ SOLN
15.0000 mg | Freq: Once | INTRAMUSCULAR | Status: AC
  Administered 2022-06-29: 15 mg via INTRAMUSCULAR
  Filled 2022-06-29: qty 1

## 2022-06-29 NOTE — Discharge Instructions (Signed)
Your x-rays do not show any signs of fracture.  Keep leg elevated and take ibuprofen for pain.

## 2022-06-29 NOTE — ED Triage Notes (Signed)
Patient arrived stating she fell this morning hitting her right knee, no LOC

## 2022-06-29 NOTE — ED Provider Notes (Signed)
[place] EMERGENCY DEPARTMENT AT [facility] Provider Note   CSN: [id number] Arrival date & time: 06/28/22  7829     History  Chief Complaint  Patient presents with   [name] is a 32 y.o. female.  HPI     This is a 32 year old female who presents with right knee pain.  Patient reports that she fell this morning hitting her right knee only.  Denies any other injury.  She has used ice and ibuprofen throughout the day.  However she states that she has been unable to get around without significant pain.  Home Medications Prior to Admission medications   Medication Sig Start Date End Date Taking? Authorizing Provider  albuterol (VENTOLIN HFA) 108 (90 Base) MCG/ACT inhaler Inhale 2 puffs into the lungs every 6 (six) hours as needed for wheezing or shortness of breath.    [provider]  BREYNA 160-4.5 MCG/ACT inhaler Inhale 1 puff into the lungs in the morning and at bedtime. 04/18/22   [provider]  cyclobenzaprine (FLEXERIL) 10 MG tablet Take 10 mg by mouth at bedtime. 05/10/22   [provider]  diclofenac Sodium (VOLTAREN) 1 % GEL Apply 2 g topically 2 (two) times daily. 06/11/22   Starleen Blue, NP  escitalopram (LEXAPRO) 20 MG tablet Take 1 tablet (20 mg total) by mouth daily. 06/24/22   White, Patrice L, NP  gabapentin (NEURONTIN) 300 MG capsule Take 2 capsules (600 mg total) by mouth 3 (three) times daily. 06/23/22   White, Patrice L, NP  hydrOXYzine (ATARAX) 25 MG tablet Take 1 tablet (25 mg total) by mouth 3 (three) times daily as needed for anxiety. 06/11/22   Starleen Blue, NP  melatonin 3 MG TABS tablet Take 1 tablet (3 mg total) by mouth at bedtime. 06/11/22   Starleen Blue, NP  OLANZapine zydis (ZYPREXA) 10 MG disintegrating tablet Take 1 tablet (10 mg total) by mouth at bedtime. 06/23/22   White, Patrice L, NP  pantoprazole (PROTONIX) 40 MG tablet Take 40 mg by mouth daily.    [provider]  prazosin  (MINIPRESS) 1 MG capsule Take 1 mg by mouth at bedtime.    [provider]  traZODone (DESYREL) 50 MG tablet Take 1 tablet (50 mg total) by mouth at bedtime as needed for up to 7 days for sleep. Patient taking differently: Take 50 mg by mouth at bedtime. 04/30/22 06/06/22  Massengill, Harrold Donath, MD      Allergies    Bee venom, Coconut flavor, Fish allergy, Geodon [ziprasidone hcl], Haloperidol and related, Lithobid [lithium], Roxicodone [oxycodone], Seroquel [quetiapine], Shellfish allergy, Phenergan [promethazine hcl], Prilosec [omeprazole], Sulfa antibiotics, Tegretol [carbamazepine], Prozac [fluoxetine], Tape, and Tylenol [acetaminophen]    Review of Systems   Review of Systems  Musculoskeletal:        Right knee pain  All other systems reviewed and are negative.   Physical Exam Updated Vital Signs BP (!) 111/48 (BP Location: Left Arm)   Pulse 77   Temp 97.7 F (36.5 C) (Oral)   Resp 16   LMP 04/25/2022 (Approximate) Comment: per pt, not pregnant  SpO2 98%  Physical Exam Vitals and nursing note reviewed.  Constitutional:      Appearance: She is well-developed. She is obese. She is not ill-appearing.  HENT:     Head: Normocephalic and atraumatic.  Eyes:     Pupils: Pupils are equal, round, and reactive to light.  Cardiovascular:     Rate and Rhythm:  Normal rate and regular rhythm.     Heart sounds: Normal heart sounds.  Pulmonary:     Effort: Pulmonary effort is normal. No respiratory distress.  Abdominal:     Palpations: Abdomen is soft.  Musculoskeletal:     Cervical back: Neck supple.     Comments: Anterior hematoma noted to the right knee with swelling noted, difficulty with range of motion, she is able to raise her leg and activate her quad but with significant pain  Skin:    General: Skin is warm and dry.  Neurological:     Mental Status: She is alert and oriented to person, place, and time.     ED Results / Procedures / Treatments   Labs (all labs  ordered are listed, but only abnormal results are displayed) Labs Reviewed - No data to display  EKG None  Radiology DG Knee Complete 4 Views Right  Result Date: 06/29/2022 CLINICAL DATA:  Pain and hematoma in the patella area after fall EXAM: RIGHT KNEE - COMPLETE 4+ VIEW COMPARISON:  Radiographs 06/23/2021 FINDINGS: No acute fracture or dislocation. Trace knee joint effusion. Soft tissue swelling about the patella. IMPRESSION: No acute fracture or dislocation. Soft tissue swelling about the patella. Electronically Signed   By: Minerva Fester M.D.   On: 06/29/2022 01:40    Procedures Procedures    Medications Ordered in ED Medications  ketorolac (TORADOL) 15 MG/ML injection 15 mg (15 mg Intramuscular Given 06/29/22 0040)    ED Course/ Medical Decision Making/ A&P                             Medical Decision Making Amount and/or Complexity of Data Reviewed Radiology: ordered.  Risk Prescription drug management.   This patient presents to the ED for concern of knee pain, this involves an extensive number of treatment options, and is a complaint that carries with it a high risk of complications and morbidity.  I considered the following differential and admission for this acute, potentially life threatening condition.  The differential diagnosis includes contusion, patellar injury or fracture, dislocation  MDM:    This is a 32 year old female who presents with right knee pain.  She is nontoxic and vital signs are reassuring.  She has an obvious hematoma and bruising to the right knee.  She can fire her quadriceps tendon.  There is no obvious deformity of the elbow.  X-rays are without any acute fracture.  Patient was given pain medication and extremity was iced.  Recommend same at home.  (Labs, imaging, consults)  Labs: I Ordered, and personally interpreted labs.  The pertinent results include: None  Imaging Studies ordered: I ordered imaging studies including x-ray right  knee I independently visualized and interpreted imaging. I agree with the radiologist interpretation  Additional history obtained from chart review.  External records from outside source obtained and reviewed including prior evaluations  Cardiac Monitoring: The patient was not maintained on a cardiac monitor.  If on the cardiac monitor, I personally viewed and interpreted the cardiac monitored which showed an underlying rhythm of: N/A  Reevaluation: After the interventions noted above, I reevaluated the patient and found that they have :stayed the same  Social Determinants of Health:  lives independently  Disposition: Discharge  Co morbidities that complicate the patient evaluation  Past Medical History:  Diagnosis Date   Acid reflux    Anxiety    Asthma    last attack 03/13/15 or  03/14/15   Autism    Carrier of fragile X syndrome    Chronic constipation    Depression    Drug-seeking behavior    Essential tremor    Headache    Overdose of acetaminophen 07/2017   and other meds   Personality disorder (HCC)    Schizo-affective psychosis (HCC)    Schizoaffective disorder, bipolar type (HCC)    Seizures (HCC)    Last seizure December 2017   Sleep apnea      Medicines Meds ordered this encounter  Medications   ketorolac (TORADOL) 15 MG/ML injection 15 mg    I have reviewed the patients home medicines and have made adjustments as needed  Problem List / ED Course: Problem List Items Addressed This Visit   None Visit Diagnoses     Contusion of right knee, initial encounter    -  Primary                   Final Clinical Impression(s) / ED Diagnoses Final diagnoses:  Contusion of right knee, initial encounter    Rx / DC Orders ED Discharge Orders     None         Wilkie Aye, Mayer Masker, MD 06/29/22 0159

## 2022-06-29 NOTE — ED Notes (Signed)
Pt here for knee pain 8/10. Pt resting in bed. Call light at bedside.
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
## 2022-07-06 ENCOUNTER — Ambulatory Visit (HOSPITAL_COMMUNITY)
Admission: EM | Admit: 2022-07-06 | Discharge: 2022-07-06 | Disposition: A | Discharge status: Other Acute Inpatient Hospital | Payer: Medicare PPO | Attending: Psychiatry | Admitting: Psychiatry

## 2022-07-06 ENCOUNTER — Inpatient Hospital Stay (HOSPITAL_COMMUNITY)
Admission: AD | Admit: 2022-07-06 | Discharge: 2022-07-09 | DRG: 885 | Disposition: A | Discharge status: Home / Self Care | Payer: Medicare PPO | Source: Intra-hospital | Attending: Psychiatry | Admitting: Psychiatry

## 2022-07-06 ENCOUNTER — Encounter (HOSPITAL_COMMUNITY): Payer: Self-pay | Admitting: Behavioral Health

## 2022-07-06 ENCOUNTER — Other Ambulatory Visit: Payer: Self-pay

## 2022-07-06 DIAGNOSIS — F4310 Post-traumatic stress disorder, unspecified: Secondary | ICD-10-CM | POA: Diagnosis present

## 2022-07-06 DIAGNOSIS — J45909 Unspecified asthma, uncomplicated: Secondary | ICD-10-CM | POA: Diagnosis present

## 2022-07-06 DIAGNOSIS — F603 Borderline personality disorder: Secondary | ICD-10-CM | POA: Diagnosis present

## 2022-07-06 DIAGNOSIS — F250 Schizoaffective disorder, bipolar type: Secondary | ICD-10-CM | POA: Insufficient documentation

## 2022-07-06 DIAGNOSIS — Z87891 Personal history of nicotine dependence: Secondary | ICD-10-CM

## 2022-07-06 DIAGNOSIS — Z818 Family history of other mental and behavioral disorders: Secondary | ICD-10-CM

## 2022-07-06 DIAGNOSIS — F251 Schizoaffective disorder, depressive type: Principal | ICD-10-CM | POA: Diagnosis present

## 2022-07-06 DIAGNOSIS — R4589 Other symptoms and signs involving emotional state: Secondary | ICD-10-CM

## 2022-07-06 DIAGNOSIS — M797 Fibromyalgia: Secondary | ICD-10-CM | POA: Diagnosis present

## 2022-07-06 DIAGNOSIS — Z148 Genetic carrier of other disease: Secondary | ICD-10-CM | POA: Diagnosis not present

## 2022-07-06 DIAGNOSIS — Z9152 Personal history of nonsuicidal self-harm: Secondary | ICD-10-CM | POA: Insufficient documentation

## 2022-07-06 DIAGNOSIS — R000 Tachycardia, unspecified: Secondary | ICD-10-CM | POA: Diagnosis not present

## 2022-07-06 DIAGNOSIS — Z9151 Personal history of suicidal behavior: Secondary | ICD-10-CM | POA: Diagnosis not present

## 2022-07-06 DIAGNOSIS — F339 Major depressive disorder, recurrent, unspecified: Secondary | ICD-10-CM | POA: Diagnosis not present

## 2022-07-06 DIAGNOSIS — R45851 Suicidal ideations: Secondary | ICD-10-CM

## 2022-07-06 DIAGNOSIS — Z825 Family history of asthma and other chronic lower respiratory diseases: Secondary | ICD-10-CM | POA: Diagnosis not present

## 2022-07-06 DIAGNOSIS — K219 Gastro-esophageal reflux disease without esophagitis: Secondary | ICD-10-CM | POA: Diagnosis present

## 2022-07-06 DIAGNOSIS — Z79899 Other long term (current) drug therapy: Secondary | ICD-10-CM

## 2022-07-06 DIAGNOSIS — F4320 Adjustment disorder, unspecified: Secondary | ICD-10-CM | POA: Diagnosis not present

## 2022-07-06 DIAGNOSIS — F845 Asperger's syndrome: Secondary | ICD-10-CM | POA: Diagnosis present

## 2022-07-06 LAB — CBC WITH DIFFERENTIAL/PLATELET
Abs Immature Granulocytes: 0.02 10*3/uL (ref 0.00–0.07)
Basophils Absolute: 0 10*3/uL (ref 0.0–0.1)
Basophils Relative: 1 %
Eosinophils Absolute: 0 10*3/uL (ref 0.0–0.5)
Eosinophils Relative: 0 %
HCT: 37.3 % (ref 36.0–46.0)
Hemoglobin: 11.8 g/dL — ABNORMAL LOW (ref 12.0–15.0)
Immature Granulocytes: 0 %
Lymphocytes Relative: 32 %
Lymphs Abs: 2.3 10*3/uL (ref 0.7–4.0)
MCH: 25.4 pg — ABNORMAL LOW (ref 26.0–34.0)
MCHC: 31.6 g/dL (ref 30.0–36.0)
MCV: 80.2 fL (ref 80.0–100.0)
Monocytes Absolute: 0.5 10*3/uL (ref 0.1–1.0)
Monocytes Relative: 7 %
Neutro Abs: 4.3 10*3/uL (ref 1.7–7.7)
Neutrophils Relative %: 60 %
Platelets: 274 10*3/uL (ref 150–400)
RBC: 4.65 MIL/uL (ref 3.87–5.11)
RDW: 14.4 % (ref 11.5–15.5)
WBC: 7.2 10*3/uL (ref 4.0–10.5)
nRBC: 0 % (ref 0.0–0.2)

## 2022-07-06 LAB — POCT URINE DRUG SCREEN - MANUAL ENTRY (I-SCREEN)
POC Amphetamine UR: NOT DETECTED
POC Buprenorphine (BUP): NOT DETECTED
POC Cocaine UR: NOT DETECTED
POC Marijuana UR: NOT DETECTED
POC Methadone UR: NOT DETECTED
POC Methamphetamine UR: NOT DETECTED
POC Morphine: NOT DETECTED
POC Oxazepam (BZO): POSITIVE — AB
POC Oxycodone UR: NOT DETECTED
POC Secobarbital (BAR): NOT DETECTED

## 2022-07-06 LAB — POCT PREGNANCY, URINE: Preg Test, Ur: NEGATIVE

## 2022-07-06 LAB — COMPREHENSIVE METABOLIC PANEL
ALT: 14 U/L (ref 0–44)
AST: 15 U/L (ref 15–41)
Albumin: 3.8 g/dL (ref 3.5–5.0)
Alkaline Phosphatase: 71 U/L (ref 38–126)
Anion gap: 6 (ref 5–15)
BUN: 6 mg/dL (ref 6–20)
CO2: 23 mmol/L (ref 22–32)
Calcium: 8.9 mg/dL (ref 8.9–10.3)
Chloride: 104 mmol/L (ref 98–111)
Creatinine, Ser: 0.74 mg/dL (ref 0.44–1.00)
GFR, Estimated: >60 mL/min (ref >60)
Glucose, Bld: 101 mg/dL — ABNORMAL HIGH (ref 70–99)
Potassium: 3.2 mmol/L — ABNORMAL LOW (ref 3.5–5.1)
Sodium: 133 mmol/L — ABNORMAL LOW (ref 135–145)
Total Bilirubin: 0.4 mg/dL (ref 0.3–1.2)
Total Protein: 6.8 g/dL (ref 6.5–8.1)

## 2022-07-06 LAB — POC URINE PREG, ED: Preg Test, Ur: NEGATIVE

## 2022-07-06 MED ORDER — ALBUTEROL SULFATE HFA 108 (90 BASE) MCG/ACT IN AERS: 2.0000 | INHALATION_SPRAY | Freq: Four times a day (QID) | RESPIRATORY_TRACT | Status: DC | PRN

## 2022-07-06 MED ORDER — PRAZOSIN HCL 1 MG PO CAPS: 1.0000 mg | ORAL_CAPSULE | Freq: Every day | ORAL | Status: DC

## 2022-07-06 MED ORDER — CYCLOBENZAPRINE HCL 10 MG PO TABS
10.0000 mg | ORAL_TABLET | Freq: Every day | ORAL | Status: DC
  Administered 2022-07-06 – 2022-07-08 (×3): 10 mg via ORAL
  Filled 2022-07-06 (×6): qty 1

## 2022-07-06 MED ORDER — ALUM & MAG HYDROXIDE-SIMETH 200-200-20 MG/5ML PO SUSP: 30.0000 mL | ORAL | Status: DC | PRN

## 2022-07-06 MED ORDER — GABAPENTIN 300 MG PO CAPS
600.0000 mg | ORAL_CAPSULE | Freq: Three times a day (TID) | ORAL | Status: DC
  Administered 2022-07-06: 600 mg via ORAL
  Filled 2022-07-06: qty 2

## 2022-07-06 MED ORDER — PANTOPRAZOLE SODIUM 40 MG PO TBEC
40.0000 mg | DELAYED_RELEASE_TABLET | Freq: Every day | ORAL | Status: DC
  Administered 2022-07-07 – 2022-07-09 (×3): 40 mg via ORAL
  Filled 2022-07-06 (×6): qty 1

## 2022-07-06 MED ORDER — DIPHENHYDRAMINE HCL 50 MG/ML IJ SOLN: 50.0000 mg | Freq: Three times a day (TID) | INTRAMUSCULAR | Status: DC | PRN

## 2022-07-06 MED ORDER — MELATONIN 3 MG PO TABS
3.0000 mg | ORAL_TABLET | Freq: Every day | ORAL | Status: DC
  Administered 2022-07-06 – 2022-07-08 (×3): 3 mg via ORAL
  Filled 2022-07-06 (×5): qty 1

## 2022-07-06 MED ORDER — IBUPROFEN 600 MG PO TABS: 600.0000 mg | ORAL_TABLET | Freq: Three times a day (TID) | ORAL | Status: DC | PRN

## 2022-07-06 MED ORDER — MELATONIN 3 MG PO TABS: 3.0000 mg | ORAL_TABLET | Freq: Every day | ORAL | Status: DC

## 2022-07-06 MED ORDER — LORAZEPAM 2 MG/ML IJ SOLN: 2.0000 mg | Freq: Three times a day (TID) | INTRAMUSCULAR | Status: DC | PRN

## 2022-07-06 MED ORDER — CYCLOBENZAPRINE HCL 10 MG PO TABS: 10.0000 mg | ORAL_TABLET | Freq: Every day | ORAL | Status: DC

## 2022-07-06 MED ORDER — LORAZEPAM 1 MG PO TABS: 1.0000 mg | ORAL_TABLET | ORAL | Status: DC | PRN

## 2022-07-06 MED ORDER — HYDROXYZINE HCL 25 MG PO TABS: 25.0000 mg | ORAL_TABLET | Freq: Three times a day (TID) | ORAL | Status: DC | PRN

## 2022-07-06 MED ORDER — GABAPENTIN 300 MG PO CAPS
600.0000 mg | ORAL_CAPSULE | Freq: Three times a day (TID) | ORAL | Status: DC
  Administered 2022-07-06 – 2022-07-09 (×8): 600 mg via ORAL
  Filled 2022-07-06 (×12): qty 2

## 2022-07-06 MED ORDER — OLANZAPINE 10 MG PO TBDP
10.0000 mg | ORAL_TABLET | Freq: Every day | ORAL | Status: DC
  Administered 2022-07-06 – 2022-07-08 (×3): 10 mg via ORAL
  Filled 2022-07-06 (×6): qty 1

## 2022-07-06 MED ORDER — DIPHENHYDRAMINE HCL 25 MG PO CAPS: 50.0000 mg | ORAL_CAPSULE | Freq: Three times a day (TID) | ORAL | Status: DC | PRN

## 2022-07-06 MED ORDER — IBUPROFEN 600 MG PO TABS
600.0000 mg | ORAL_TABLET | Freq: Three times a day (TID) | ORAL | Status: DC | PRN
  Administered 2022-07-06: 600 mg via ORAL
  Filled 2022-07-06: qty 1

## 2022-07-06 MED ORDER — MOMETASONE FURO-FORMOTEROL FUM 200-5 MCG/ACT IN AERO
2.0000 | INHALATION_SPRAY | Freq: Two times a day (BID) | RESPIRATORY_TRACT | Status: DC
  Administered 2022-07-06: 2 via RESPIRATORY_TRACT
  Filled 2022-07-06: qty 8.8

## 2022-07-06 MED ORDER — MAGNESIUM HYDROXIDE 400 MG/5ML PO SUSP: 30.0000 mL | Freq: Every day | ORAL | Status: DC | PRN

## 2022-07-06 MED ORDER — GABAPENTIN 300 MG PO CAPS
600.0000 mg | ORAL_CAPSULE | Freq: Once | ORAL | Status: AC
  Administered 2022-07-06: 600 mg via ORAL
  Filled 2022-07-06: qty 2

## 2022-07-06 MED ORDER — ESCITALOPRAM OXALATE 20 MG PO TABS
20.0000 mg | ORAL_TABLET | Freq: Every day | ORAL | Status: DC
  Administered 2022-07-07 – 2022-07-09 (×3): 20 mg via ORAL
  Filled 2022-07-06 (×4): qty 1

## 2022-07-06 MED ORDER — ESCITALOPRAM OXALATE 10 MG PO TABS
20.0000 mg | ORAL_TABLET | Freq: Every day | ORAL | Status: DC
  Administered 2022-07-06: 20 mg via ORAL
  Filled 2022-07-06: qty 2

## 2022-07-06 MED ORDER — LORAZEPAM 1 MG PO TABS
2.0000 mg | ORAL_TABLET | Freq: Three times a day (TID) | ORAL | Status: DC | PRN
  Administered 2022-07-06: 2 mg via ORAL
  Filled 2022-07-06: qty 2

## 2022-07-06 MED ORDER — OLANZAPINE 10 MG PO TBDP: 10.0000 mg | ORAL_TABLET | Freq: Three times a day (TID) | ORAL | Status: DC | PRN

## 2022-07-06 MED ORDER — PRAZOSIN HCL 1 MG PO CAPS
1.0000 mg | ORAL_CAPSULE | Freq: Every day | ORAL | Status: DC
  Administered 2022-07-06 – 2022-07-08 (×3): 1 mg via ORAL
  Filled 2022-07-06 (×6): qty 1

## 2022-07-06 MED ORDER — OLANZAPINE 10 MG PO TBDP: 10.0000 mg | ORAL_TABLET | Freq: Every day | ORAL | Status: DC

## 2022-07-06 MED ORDER — HYDROXYZINE HCL 25 MG PO TABS
25.0000 mg | ORAL_TABLET | Freq: Three times a day (TID) | ORAL | Status: DC | PRN
  Administered 2022-07-06 – 2022-07-09 (×5): 25 mg via ORAL
  Filled 2022-07-06 (×5): qty 1

## 2022-07-06 MED ORDER — MOMETASONE FURO-FORMOTEROL FUM 200-5 MCG/ACT IN AERO
2.0000 | INHALATION_SPRAY | Freq: Two times a day (BID) | RESPIRATORY_TRACT | Status: DC
  Administered 2022-07-06 – 2022-07-09 (×6): 2 via RESPIRATORY_TRACT
  Filled 2022-07-06: qty 8.8

## 2022-07-06 MED ORDER — ACETAMINOPHEN 325 MG PO TABS
650.0000 mg | ORAL_TABLET | Freq: Four times a day (QID) | ORAL | Status: DC | PRN
Start: 2022-07-06 — End: 2022-07-06

## 2022-07-06 MED ORDER — PANTOPRAZOLE SODIUM 40 MG PO TBEC
40.0000 mg | DELAYED_RELEASE_TABLET | Freq: Every day | ORAL | Status: DC
  Administered 2022-07-06: 40 mg via ORAL
  Filled 2022-07-06: qty 1

## 2022-07-06 NOTE — Plan of Care (Signed)
  Problem: Education: Goal: Emotional status will improve Outcome: Progressing Goal: Mental status will improve Outcome: Progressing Goal: Verbalization of understanding the information provided will improve Outcome: Progressing   Problem: Activity: Goal: Interest or engagement in activities will improve Outcome: Progressing Goal: Sleeping patterns will improve Outcome: Progressing   Problem: Coping: Goal: Ability to verbalize frustrations and anger appropriately will improve Outcome: Progressing Goal: Ability to demonstrate self-control will improve Outcome: Progressing   Problem: Health Behavior/Discharge Planning: Goal: Identification of resources available to assist in meeting health care needs will improve Outcome: Progressing Goal: Compliance with treatment plan for underlying cause of condition will improve Outcome: Progressing   Problem: Physical Regulation: Goal: Ability to maintain clinical measurements within normal limits will improve Outcome: Progressing   Problem: Safety: Goal: Periods of time without injury will increase Outcome: Progressing   Problem: Education: Goal: Utilization of techniques to improve thought processes will improve Outcome: Progressing Goal: Knowledge of the prescribed therapeutic regimen will improve Outcome: Progressing

## 2022-07-06 NOTE — ED Notes (Signed)
Patient alert and oriented x 3. Endorse  SI/ denies HI/AVH. Denies intent or plan to harm others. Routine conducted according to faculty protocol. Encourage patient to notify staff with any needs or concerns. Patient verbalized agreement and understanding. Will continue to monitor for safety.

## 2022-07-06 NOTE — ED Notes (Signed)
Patient is requesting ibuprofen states her jaw is hurting . Rn notified provider.
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
## 2022-07-06 NOTE — ED Notes (Signed)
[facility] lab courier called to transport labs to [facility] lab 
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
## 2022-07-06 NOTE — ED Provider Notes (Signed)
[facility] Continuous Assessment Admission H&P  Date: 07/06/22 Patient Name: [name] MRN: [id number] Chief Complaint: I'm suicidal  Diagnoses:  Final diagnoses:  Suicidal ideation  Recurrent major depressive disorder, remission status unspecified (HCC)  Ineffective coping    HPI: [name],  32 y/o female with a history of schizoaffective dx, adjustment dx, SI, bipolar dx, and PTSD. presented to [facility] via [facility] for suicidal ideation.  Per the patient she is suicidal with plans to OD on her medication because Social Security denied her re-certification for her benefit which is has been getting since she was 32 year old.  Pt has an extensive ER visit for and most recently patient was hospitalize at [facility] from 06/18/22-06/21/22.    Face-to-face observation of patient, patient is alert and oriented x 4, speech is clear, maintained minimal eye contact.  Upon entering the room patient is observed sitting in the chair with her head down.  Patient appears depressed and anxious per the patient she is suicidal with plans to overdose on her medication when asked why patient stated social security did not recertify her for her benefits.  Patient denies HI, AVH or paranoia at this time.  Patient denies alcohol use, denies illicit drug use, denies smoking.  Patient does not seem to be influenced by external or internal stimuli at this time.  Patient has been chronic suicidal over the years.  Discussed with patient continues assessment for overnight observation and reassessment in the morning patient is in agreement.  Recommend inpatient observation    Total Time spent with patient: 20 minutes  Musculoskeletal  Strength & Muscle Tone: within normal limits Gait & Station: normal Patient leans: N/A  Psychiatric Specialty Exam  Presentation General Appearance:  Casual  Eye Contact: Good  Speech: Clear and Coherent  Speech Volume: Normal  Handedness: Right   Mood  and Affect  Mood: Anxious; Depressed  Affect: Congruent   Thought Process  Thought Processes: Coherent  Descriptions of Associations:Intact  Orientation:Full (Time, Place and Person)  Thought Content:Logical  Diagnosis of Schizophrenia or Schizoaffective disorder in past: No  Duration of Psychotic Symptoms: Greater than six months  Hallucinations:Hallucinations: None  Ideas of Reference:None  Suicidal Thoughts:Suicidal Thoughts: Yes, Active SI Active Intent and/or Plan: With Intent; With Plan  Homicidal Thoughts:Homicidal Thoughts: No   Sensorium  Memory: Immediate Fair  Judgment: Fair  Insight: Fair   Chartered certified accountant: Fair  Attention Span: Fair  Recall: [employer] of Knowledge: Fair  Language: Fair   Psychomotor Activity  Psychomotor Activity: Psychomotor Activity: Normal   Assets  Assets: Desire for Improvement; Social Support; Resilience   Sleep  Sleep: Sleep: Fair Number of Hours of Sleep: 6   Nutritional Assessment (For OBS and FBC admissions only) Has the patient had a weight loss or gain of 10 pounds or more in the last 3 months?: No Has the patient had a decrease in food intake/or appetite?: No Does the patient have dental problems?: No Does the patient have eating habits or behaviors that may be indicators of an eating disorder including binging or inducing vomiting?: No Has the patient recently lost weight without trying?: 0 Has the patient been eating poorly because of a decreased appetite?: 0 Malnutrition Screening Tool Score: 0    Physical Exam HENT:     Head: Normocephalic.     Nose: Nose normal.  Cardiovascular:     Rate and Rhythm: Normal rate.  Pulmonary:     Effort: Pulmonary effort  is normal.  Musculoskeletal:        General: Normal range of motion.     Cervical back: Normal range of motion.  Neurological:     General: No focal deficit present.     Mental Status: She is alert.   Psychiatric:        Mood and Affect: Mood normal.        Behavior: Behavior normal.        Thought Content: Thought content normal.        Judgment: Judgment normal.    Review of Systems  Constitutional: Negative.   HENT: Negative.    Eyes: Negative.   Respiratory: Negative.    Cardiovascular: Negative.   Gastrointestinal: Negative.   Genitourinary: Negative.   Musculoskeletal: Negative.   Skin: Negative.   Neurological: Negative.   Psychiatric/Behavioral:  Positive for depression and suicidal ideas. The patient is nervous/anxious.     Blood pressure 111/62, pulse 96, temperature 98.5 F (36.9 C), temperature source Oral, resp. rate 18, SpO2 98 %. There is no height or weight on file to calculate BMI.  Past Psychiatric History: Schizoaffective disorder, bipolar disorder, suicidal ideation,  Is the patient at risk to self? Yes  Has the patient been a risk to self in the past 6 months? Yes .    Has the patient been a risk to self within the distant past? Yes   Is the patient a risk to others? No   Has the patient been a risk to others in the past 6 months? No   Has the patient been a risk to others within the distant past? No   Past Medical History: See chart  Family History: Unknown  Social History: None  Last Labs:  Admission on 07/06/2022  Component Date Value Ref Range Status   Preg Test, Ur 07/06/2022 Negative  Negative Final   POC Amphetamine UR 07/06/2022 None Detected  NONE DETECTED (Cut Off Level 1000 ng/mL) Final   POC Secobarbital (BAR) 07/06/2022 None Detected  NONE DETECTED (Cut Off Level 300 ng/mL) Final   POC Buprenorphine (BUP) 07/06/2022 None Detected  NONE DETECTED (Cut Off Level 10 ng/mL) Final   POC Oxazepam (BZO) 07/06/2022 Positive (A)  NONE DETECTED (Cut Off Level 300 ng/mL) Final   POC Cocaine UR 07/06/2022 None Detected  NONE DETECTED (Cut Off Level 300 ng/mL) Final   POC Methamphetamine UR 07/06/2022 None Detected  NONE DETECTED (Cut Off Level  1000 ng/mL) Final   POC Morphine 07/06/2022 None Detected  NONE DETECTED (Cut Off Level 300 ng/mL) Final   POC Methadone UR 07/06/2022 None Detected  NONE DETECTED (Cut Off Level 300 ng/mL) Final   POC Oxycodone UR 07/06/2022 None Detected  NONE DETECTED (Cut Off Level 100 ng/mL) Final   POC Marijuana UR 07/06/2022 None Detected  NONE DETECTED (Cut Off Level 50 ng/mL) Final   Preg Test, Ur 07/06/2022 NEGATIVE  NEGATIVE Final   Comment:        THE SENSITIVITY OF THIS METHODOLOGY IS >24 mIU/mL   Admission on 06/22/2022, Discharged on 06/23/2022  Component Date Value Ref Range Status   WBC 06/22/2022 8.1  4.0 - 10.5 K/uL Final   RBC 06/22/2022 4.45  3.87 - 5.11 MIL/uL Final   Hemoglobin 06/22/2022 10.8 (L)  12.0 - 15.0 g/dL Final   HCT 16/11/9602 34.7 (L)  36.0 - 46.0 % Final   MCV 06/22/2022 78.0 (L)  80.0 - 100.0 fL Final   MCH 06/22/2022 24.3 (L)  26.0 - 34.0  pg Final   MCHC 06/22/2022 31.1  30.0 - 36.0 g/dL Final   RDW 16/11/9602 14.4  11.5 - 15.5 % Final   Platelets 06/22/2022 299  150 - 400 K/uL Final   nRBC 06/22/2022 0.0  0.0 - 0.2 % Final   Neutrophils Relative % 06/22/2022 63  % Final   Neutro Abs 06/22/2022 5.1  1.7 - 7.7 K/uL Final   Lymphocytes Relative 06/22/2022 30  % Final   Lymphs Abs 06/22/2022 2.4  0.7 - 4.0 K/uL Final   Monocytes Relative 06/22/2022 6  % Final   Monocytes Absolute 06/22/2022 0.5  0.1 - 1.0 K/uL Final   Eosinophils Relative 06/22/2022 1  % Final   Eosinophils Absolute 06/22/2022 0.1  0.0 - 0.5 K/uL Final   Basophils Relative 06/22/2022 0  % Final   Basophils Absolute 06/22/2022 0.0  0.0 - 0.1 K/uL Final   Immature Granulocytes 06/22/2022 0  % Final   Abs Immature Granulocytes 06/22/2022 0.03  0.00 - 0.07 K/uL Final   Performed at [facility], [address], [place], [place] [address]   Sodium 06/22/2022 138  135 - 145 mmol/L Final   Potassium 06/22/2022 3.6  3.5 - 5.1 mmol/L Final   Chloride 06/22/2022 104  98 - 111 mmol/L Final   CO2  06/22/2022 22  22 - 32 mmol/L Final   Glucose, Bld 06/22/2022 93  70 - 99 mg/dL Final   Glucose reference range applies only to samples taken after fasting for at least 8 hours.   BUN 06/22/2022 5 (L)  6 - 20 mg/dL Final   Creatinine, Ser 06/22/2022 0.74  0.44 - 1.00 mg/dL Final   Calcium 11/91/4782 9.2  8.9 - 10.3 mg/dL Final   Total Protein 95/62/1308 6.6  6.5 - 8.1 g/dL Final   Albumin 65/78/4696 3.9  3.5 - 5.0 g/dL Final   AST 29/52/8413 16  15 - 41 U/L Final   ALT 06/22/2022 16  0 - 44 U/L Final   Alkaline Phosphatase 06/22/2022 64  38 - 126 U/L Final   Total Bilirubin 06/22/2022 0.3  0.3 - 1.2 mg/dL Final   GFR, Estimated 06/22/2022 >60  >60 mL/min Final   Comment: (NOTE) Calculated using the CKD-EPI Creatinine Equation (2021)    Anion gap 06/22/2022 12  5 - 15 Final   Performed at [facility], [address] [place], [place] [address]   Alcohol, Ethyl (B) 06/22/2022 <10  <10 mg/dL Final   Comment: (NOTE) Lowest detectable limit for serum alcohol is 10 mg/dL.  For medical purposes only. Performed at [facility], [address] [place], [place] [address]    Preg Test, Ur 06/22/2022 Negative  Negative Final   POC Amphetamine UR 06/22/2022 None Detected  NONE DETECTED (Cut Off Level 1000 ng/mL) Final   POC Secobarbital (BAR) 06/22/2022 None Detected  NONE DETECTED (Cut Off Level 300 ng/mL) Final   POC Buprenorphine (BUP) 06/22/2022 None Detected  NONE DETECTED (Cut Off Level 10 ng/mL) Final   POC Oxazepam (BZO) 06/22/2022 Positive (A)  NONE DETECTED (Cut Off Level 300 ng/mL) Final   POC Cocaine UR 06/22/2022 None Detected  NONE DETECTED (Cut Off Level 300 ng/mL) Final   POC Methamphetamine UR 06/22/2022 None Detected  NONE DETECTED (Cut Off Level 1000 ng/mL) Final   POC Morphine 06/22/2022 None Detected  NONE DETECTED (Cut Off Level 300 ng/mL) Final   POC Methadone UR 06/22/2022 None Detected  NONE DETECTED (Cut Off Level  300 ng/mL) Final   POC Oxycodone UR  06/22/2022 None Detected  NONE DETECTED (Cut Off Level 100 ng/mL) Final   POC Marijuana UR 06/22/2022 None Detected  NONE DETECTED (Cut Off Level 50 ng/mL) Final  Admission on 06/16/2022, Discharged on 06/18/2022  Component Date Value Ref Range Status   Sodium 06/16/2022 139  135 - 145 mmol/L Final   Potassium 06/16/2022 3.5  3.5 - 5.1 mmol/L Final   Chloride 06/16/2022 109  98 - 111 mmol/L Final   CO2 06/16/2022 21 (L)  22 - 32 mmol/L Final   Glucose, Bld 06/16/2022 90  70 - 99 mg/dL Final   Glucose reference range applies only to samples taken after fasting for at least 8 hours.   BUN 06/16/2022 10  6 - 20 mg/dL Final   Creatinine, Ser 06/16/2022 0.71  0.44 - 1.00 mg/dL Final   Calcium 19/14/7829 8.7 (L)  8.9 - 10.3 mg/dL Final   Total Protein 56/21/3086 6.8  6.5 - 8.1 g/dL Final   Albumin 57/84/6962 3.7  3.5 - 5.0 g/dL Final   AST 95/28/4132 18  15 - 41 U/L Final   ALT 06/16/2022 18  0 - 44 U/L Final   Alkaline Phosphatase 06/16/2022 59  38 - 126 U/L Final   Total Bilirubin 06/16/2022 0.4  0.3 - 1.2 mg/dL Final   GFR, Estimated 06/16/2022 >60  >60 mL/min Final   Comment: (NOTE) Calculated using the CKD-EPI Creatinine Equation (2021)    Anion gap 06/16/2022 9  5 - 15 Final   Performed at [facility], [address] [name], [place] [address]   Alcohol, Ethyl (B) 06/16/2022 <10  <10 mg/dL Final   Comment: (NOTE) Lowest detectable limit for serum alcohol is 10 mg/dL.  For medical purposes only. Performed at [facility], [address], [place], [place] [address]    Salicylate Lvl 06/16/2022 <7.0 (L)  7.0 - 30.0 mg/dL Final   Performed at [facility], [address] [place], [place] [address]   Acetaminophen (Tylenol), Serum 06/16/2022 <10 (L)  10 - 30 ug/mL Final   Comment: (NOTE) Therapeutic concentrations vary significantly. A range of 10-30 ug/mL  may be an effective concentration for many patients. However, some   are best treated at concentrations outside of this range. Acetaminophen concentrations >150 ug/mL at 4 hours after ingestion  and >50 ug/mL at 12 hours after ingestion are often associated with  toxic reactions.  Performed at [facility], [address] [place], [place] [address]    WBC 06/16/2022 10.6 (H)  4.0 - 10.5 K/uL Final   RBC 06/16/2022 4.14  3.87 - 5.11 MIL/uL Final   Hemoglobin 06/16/2022 10.3 (L)  12.0 - 15.0 g/dL Final   HCT 59/56/3875 33.5 (L)  36.0 - 46.0 % Final   MCV 06/16/2022 80.9  80.0 - 100.0 fL Final   MCH 06/16/2022 24.9 (L)  26.0 - 34.0 pg Final   MCHC 06/16/2022 30.7  30.0 - 36.0 g/dL Final   RDW 64/33/2951 14.4  11.5 - 15.5 % Final   Platelets 06/16/2022 276  150 - 400 K/uL Final   nRBC 06/16/2022 0.0  0.0 - 0.2 % Final   Performed at [facility], [address] [place], [place] [address]   Opiates 06/17/2022 NONE DETECTED  NONE DETECTED Final   Cocaine 06/17/2022 NONE DETECTED  NONE DETECTED Final   Benzodiazepines 06/17/2022 NONE DETECTED  NONE DETECTED Final   Amphetamines  06/17/2022 NONE DETECTED  NONE DETECTED Final   Tetrahydrocannabinol 06/17/2022 NONE DETECTED  NONE DETECTED Final   Barbiturates 06/17/2022 NONE DETECTED  NONE DETECTED Final   Comment: (NOTE) DRUG SCREEN FOR MEDICAL PURPOSES ONLY.  IF CONFIRMATION IS NEEDED FOR ANY PURPOSE, NOTIFY LAB WITHIN 5 DAYS.  LOWEST DETECTABLE LIMITS FOR URINE DRUG SCREEN Drug Class                     Cutoff (ng/mL) Amphetamine and metabolites    1000 Barbiturate and metabolites    200 Benzodiazepine                 200 Opiates and metabolites        300 Cocaine and metabolites        300 THC                            50 Performed at [facility], [address], [place], [place] [address]    I-stat hCG, quantitative 06/16/2022 <5.0  <5 mIU/mL Final   Comment 3 06/16/2022          Final   Comment:   GEST. AGE      CONC.  (mIU/mL)    <=1 WEEK        5 - 50     2 WEEKS       50 - 500     3 WEEKS       100 - 10,000     4 WEEKS     1,000 - 30,000        FEMALE AND NON-PREGNANT FEMALE:     LESS THAN 5 mIU/mL   Admission on 06/04/2022, Discharged on 06/07/2022  Component Date Value Ref Range Status   Glucose-Capillary 06/04/2022 109 (H)  70 - 99 mg/dL Final   Glucose reference range applies only to samples taken after fasting for at least 8 hours.   Sodium 06/04/2022 138  135 - 145 mmol/L Final   Potassium 06/04/2022 3.3 (L)  3.5 - 5.1 mmol/L Final   Chloride 06/04/2022 108  98 - 111 mmol/L Final   CO2 06/04/2022 23  22 - 32 mmol/L Final   Glucose, Bld 06/04/2022 119 (H)  70 - 99 mg/dL Final   Glucose reference range applies only to samples taken after fasting for at least 8 hours.   BUN 06/04/2022 10  6 - 20 mg/dL Final   Creatinine, Ser 06/04/2022 0.79  0.44 - 1.00 mg/dL Final   Calcium 64/33/2951 8.7 (L)  8.9 - 10.3 mg/dL Final   Total Protein 88/41/6606 7.2  6.5 - 8.1 g/dL Final   Albumin 30/16/0109 3.7  3.5 - 5.0 g/dL Final   AST 32/35/5732 19  15 - 41 U/L Final   ALT 06/04/2022 15  0 - 44 U/L Final   Alkaline Phosphatase 06/04/2022 68  38 - 126 U/L Final   Total Bilirubin 06/04/2022 0.5  0.3 - 1.2 mg/dL Final   GFR, Estimated 06/04/2022 >60  >60 mL/min Final   Comment: (NOTE) Calculated using the CKD-EPI Creatinine Equation (2021)    Anion gap 06/04/2022 7  5 - 15 Final   Performed at [facility], [address] [place], [place] [address]   Salicylate Lvl 06/04/2022 <7.0 (L)  7.0 - 30.0 mg/dL Final   Performed at [facility], [address], [place], [place] [address]   Acetaminophen (Tylenol),  Serum 06/04/2022 <10 (L)  10 - 30 ug/mL Final   Comment: (NOTE) Therapeutic concentrations vary significantly. A range of 10-30 ug/mL  may be an effective concentration for many patients. However, some  are best treated at concentrations outside of this range. Acetaminophen  concentrations >150 ug/mL at 4 hours after ingestion  and >50 ug/mL at 12 hours after ingestion are often associated with  toxic reactions.  Performed at [facility], [address], [place], [place] [address]    Alcohol, Ethyl (B) 06/04/2022 <10  <10 mg/dL Final   Comment: (NOTE) Lowest detectable limit for serum alcohol is 10 mg/dL.  For medical purposes only. Performed at [facility], [address], [place], [place] [address]    Opiates 06/05/2022 NONE DETECTED  NONE DETECTED Final   Cocaine 06/05/2022 NONE DETECTED  NONE DETECTED Final   Benzodiazepines 06/05/2022 NONE DETECTED  NONE DETECTED Final   Amphetamines 06/05/2022 NONE DETECTED  NONE DETECTED Final   Tetrahydrocannabinol 06/05/2022 NONE DETECTED  NONE DETECTED Final   Barbiturates 06/05/2022 NONE DETECTED  NONE DETECTED Final   Comment: (NOTE) DRUG SCREEN FOR MEDICAL PURPOSES ONLY.  IF CONFIRMATION IS NEEDED FOR ANY PURPOSE, NOTIFY LAB WITHIN 5 DAYS.  LOWEST DETECTABLE LIMITS FOR URINE DRUG SCREEN Drug Class                     Cutoff (ng/mL) Amphetamine and metabolites    1000 Barbiturate and metabolites    200 Benzodiazepine                 200 Opiates and metabolites        300 Cocaine and metabolites        300 THC                            50 Performed at [facility], [address], [place], [place] [address]    WBC 06/04/2022 6.6  4.0 - 10.5 K/uL Final   RBC 06/04/2022 4.32  3.87 - 5.11 MIL/uL Final   Hemoglobin 06/04/2022 10.9 (L)  12.0 - 15.0 g/dL Final   HCT 82/95/6213 34.6 (L)  36.0 - 46.0 % Final   MCV 06/04/2022 80.1  80.0 - 100.0 fL Final   MCH 06/04/2022 25.2 (L)  26.0 - 34.0 pg Final   MCHC 06/04/2022 31.5  30.0 - 36.0 g/dL Final   RDW 08/65/7846 14.1  11.5 - 15.5 % Final   Platelets 06/04/2022 254  150 - 400 K/uL Final   nRBC 06/04/2022 0.0  0.0 - 0.2 % Final   Neutrophils Relative % 06/04/2022 58  % Final   Neutro Abs  06/04/2022 3.8  1.7 - 7.7 K/uL Final   Lymphocytes Relative 06/04/2022 36  % Final   Lymphs Abs 06/04/2022 2.4  0.7 - 4.0 K/uL Final   Monocytes Relative 06/04/2022 5  % Final   Monocytes Absolute 06/04/2022 0.3  0.1 - 1.0 K/uL Final   Eosinophils Relative 06/04/2022 1  % Final   Eosinophils Absolute 06/04/2022 0.0  0.0 - 0.5 K/uL Final   Basophils Relative 06/04/2022 0  % Final   Basophils Absolute 06/04/2022 0.0  0.0 - 0.1 K/uL Final   Immature Granulocytes 06/04/2022 0  % Final   Abs Immature Granulocytes 06/04/2022 0.02  0.00 - 0.07 K/uL Final   Performed at [facility], [address], [place], [place] [address]   I-stat  hCG, quantitative 06/05/2022 <5.0  <5 mIU/mL Final   Comment 3 06/05/2022          Final   Comment:   GEST. AGE      CONC.  (mIU/mL)   <=1 WEEK        5 - 50     2 WEEKS       50 - 500     3 WEEKS       100 - 10,000     4 WEEKS     1,000 - 30,000        FEMALE AND NON-PREGNANT FEMALE:     LESS THAN 5 mIU/mL    Sodium 06/05/2022 142  135 - 145 mmol/L Final   Potassium 06/05/2022 3.5  3.5 - 5.1 mmol/L Final   Chloride 06/05/2022 106  98 - 111 mmol/L Final   BUN 06/05/2022 10  6 - 20 mg/dL Final   Creatinine, Ser 06/05/2022 0.70  0.44 - 1.00 mg/dL Final   Glucose, Bld 02/72/5366 107 (H)  70 - 99 mg/dL Final   Glucose reference range applies only to samples taken after fasting for at least 8 hours.   Calcium, Ion 06/05/2022 1.24  1.15 - 1.40 mmol/L Final   TCO2 06/05/2022 26  22 - 32 mmol/L Final   Hemoglobin 06/05/2022 10.9 (L)  12.0 - 15.0 g/dL Final   HCT 44/04/4740 32.0 (L)  36.0 - 46.0 % Final   Magnesium 06/04/2022 2.1  1.7 - 2.4 mg/dL Final   Performed at [facility], [address], [place], [place] [address]   Acetaminophen (Tylenol), Serum 06/05/2022 <10 (L)  10 - 30 ug/mL Final   Comment: (NOTE) Therapeutic concentrations vary significantly. A range of 10-30 ug/mL  may be an effective concentration for many  patients. However, some  are best treated at concentrations outside of this range. Acetaminophen concentrations >150 ug/mL at 4 hours after ingestion  and >50 ug/mL at 12 hours after ingestion are often associated with  toxic reactions.  Performed at [facility], [address], [place], [place] [address]    WBC 06/05/2022 7.1  4.0 - 10.5 K/uL Final   RBC 06/05/2022 4.11  3.87 - 5.11 MIL/uL Final   Hemoglobin 06/05/2022 10.5 (L)  12.0 - 15.0 g/dL Final   HCT 33/29/5188 33.8 (L)  36.0 - 46.0 % Final   MCV 06/05/2022 82.2  80.0 - 100.0 fL Final   MCH 06/05/2022 25.5 (L)  26.0 - 34.0 pg Final   MCHC 06/05/2022 31.1  30.0 - 36.0 g/dL Final   RDW 41/66/0630 14.5  11.5 - 15.5 % Final   Platelets 06/05/2022 259  150 - 400 K/uL Final   nRBC 06/05/2022 0.0  0.0 - 0.2 % Final   Performed at [facility], [address] [place], [place] [address]   Creatinine, Ser 06/05/2022 0.69  0.44 - 1.00 mg/dL Final   GFR, Estimated 06/05/2022 >60  >60 mL/min Final   Comment: (NOTE) Calculated using the CKD-EPI Creatinine Equation (2021) Performed at [facility], [address], [place], [place] [address]    Vitamin B-12 06/05/2022 3,066 (H)  180 - 914 pg/mL Final   Comment: RESULT CONFIRMED BY MANUAL DILUTION (NOTE) This assay is not validated for testing neonatal or myeloproliferative syndrome specimens for Vitamin B12 levels. Performed at [facility], [address], [place], [place] [address]    Folate 06/05/2022 13.2  >5.9 ng/mL Final  Performed at [facility], [address], [place], [place] [address]   Iron 06/05/2022 35  28 - 170 ug/dL Final   TIBC 60/45/4098 396  250 - 450 ug/dL Final   Saturation Ratios 06/05/2022 9 (L)  10.4 - 31.8 % Final   UIBC 06/05/2022 361  ug/dL Final   Performed at [facility], [address], [place], [place] [address]   Ferritin 06/05/2022  12  11 - 307 ng/mL Final   Performed at [facility], [address] [place], [place] [address]   Retic Ct Pct 06/05/2022 1.6  0.4 - 3.1 % Final   RBC. 06/05/2022 4.14  3.87 - 5.11 MIL/uL Final   Retic Count, Absolute 06/05/2022 65.0  19.0 - 186.0 K/uL Final   Immature Retic Fract 06/05/2022 13.6  2.3 - 15.9 % Final   Performed at [facility], [address] [place], [place] [address]  Admission on 05/25/2022, Discharged on 05/28/2022  Component Date Value Ref Range Status   WBC 05/25/2022 6.9  4.0 - 10.5 K/uL Final   RBC 05/25/2022 4.40  3.87 - 5.11 MIL/uL Final   Hemoglobin 05/25/2022 11.1 (L)  12.0 - 15.0 g/dL Final   HCT 86/57/8469 35.8 (L)  36.0 - 46.0 % Final   MCV 05/25/2022 81.4  80.0 - 100.0 fL Final   MCH 05/25/2022 25.2 (L)  26.0 - 34.0 pg Final   MCHC 05/25/2022 31.0  30.0 - 36.0 g/dL Final   RDW 62/95/2841 14.5  11.5 - 15.5 % Final   Platelets 05/25/2022 277  150 - 400 K/uL Final   nRBC 05/25/2022 0.0  0.0 - 0.2 % Final   Neutrophils Relative % 05/25/2022 59  % Final   Neutro Abs 05/25/2022 4.0  1.7 - 7.7 K/uL Final   Lymphocytes Relative 05/25/2022 33  % Final   Lymphs Abs 05/25/2022 2.3  0.7 - 4.0 K/uL Final   Monocytes Relative 05/25/2022 6  % Final   Monocytes Absolute 05/25/2022 0.4  0.1 - 1.0 K/uL Final   Eosinophils Relative 05/25/2022 1  % Final   Eosinophils Absolute 05/25/2022 0.0  0.0 - 0.5 K/uL Final   Basophils Relative 05/25/2022 1  % Final   Basophils Absolute 05/25/2022 0.0  0.0 - 0.1 K/uL Final   Immature Granulocytes 05/25/2022 0  % Final   Abs Immature Granulocytes 05/25/2022 0.03  0.00 - 0.07 K/uL Final   Performed at [facility], [address], [place], [place] [address]   Sodium 05/25/2022 138  135 - 145 mmol/L Final   Potassium 05/25/2022 3.8  3.5 - 5.1 mmol/L Final   Chloride 05/25/2022 110  98 - 111 mmol/L Final   CO2 05/25/2022 19 (L)  22 - 32 mmol/L Final   Glucose, Bld 05/25/2022 104 (H)  70  - 99 mg/dL Final   Glucose reference range applies only to samples taken after fasting for at least 8 hours.   BUN 05/25/2022 6  6 - 20 mg/dL Final   Creatinine, Ser 05/25/2022 0.77  0.44 - 1.00 mg/dL Final   Calcium 12/02/2534 8.8 (L)  8.9 - 10.3 mg/dL Final   Total Protein 64/40/3474 6.7  6.5 - 8.1 g/dL Final   Albumin 25/95/6387 3.6  3.5 - 5.0 g/dL Final   AST 56/43/3295 18  15 - 41 U/L Final   ALT 05/25/2022 15  0 - 44 U/L Final   Alkaline Phosphatase 05/25/2022 72  38 - 126 U/L  Final   Total Bilirubin 05/25/2022 0.3  0.3 - 1.2 mg/dL Final   GFR, Estimated 05/25/2022 >60  >60 mL/min Final   Comment: (NOTE) Calculated using the CKD-EPI Creatinine Equation (2021)    Anion gap 05/25/2022 9  5 - 15 Final   Performed at [facility], [address], [place], [place] [address]   Alcohol, Ethyl (B) 05/25/2022 <10  <10 mg/dL Final   Comment: (NOTE) Lowest detectable limit for serum alcohol is 10 mg/dL.  For medical purposes only. Performed at [facility], [address], [place], [place] [address]    Salicylate Lvl 05/25/2022 <7.0 (L)  7.0 - 30.0 mg/dL Final   Performed at [facility], [address], [place], [place] [address]   Acetaminophen (Tylenol), Serum 05/25/2022 <10 (L)  10 - 30 ug/mL Final   Comment: (NOTE) Therapeutic concentrations vary significantly. A range of 10-30 ug/mL  may be an effective concentration for many patients. However, some  are best treated at concentrations outside of this range. Acetaminophen concentrations >150 ug/mL at 4 hours after ingestion  and >50 ug/mL at 12 hours after ingestion are often associated with  toxic reactions.  Performed at [facility], [address], [place], [place] [address]    Opiates 05/25/2022 NONE DETECTED  NONE DETECTED Final   Cocaine 05/25/2022 NONE DETECTED  NONE DETECTED Final   Benzodiazepines 05/25/2022 NONE DETECTED  NONE DETECTED Final   Amphetamines 05/25/2022 NONE DETECTED  NONE  DETECTED Final   Tetrahydrocannabinol 05/25/2022 NONE DETECTED  NONE DETECTED Final   Barbiturates 05/25/2022 NONE DETECTED  NONE DETECTED Final   Comment: (NOTE) DRUG SCREEN FOR MEDICAL PURPOSES ONLY.  IF CONFIRMATION IS NEEDED FOR ANY PURPOSE, NOTIFY LAB WITHIN 5 DAYS.  LOWEST DETECTABLE LIMITS FOR URINE DRUG SCREEN Drug Class                     Cutoff (ng/mL) Amphetamine and metabolites    1000 Barbiturate and metabolites    200 Benzodiazepine                 200 Opiates and metabolites        300 Cocaine and metabolites        300 THC                            50 Performed at [facility], [address] [place], [place] [address]    I-stat hCG, quantitative 05/25/2022 <5.0  <5 mIU/mL Final   Comment 3 05/25/2022          Final   Comment:   GEST. AGE      CONC.  (mIU/mL)   <=1 WEEK        5 - 50     2 WEEKS       50 - 500     3 WEEKS       100 - 10,000     4 WEEKS     1,000 - 30,000        FEMALE AND NON-PREGNANT FEMALE:     LESS THAN 5 mIU/mL    Acetaminophen (Tylenol), Serum 05/25/2022 <10 (L)  10 - 30 ug/mL Final   Comment: (NOTE) Therapeutic concentrations vary significantly. A range of 10-30 ug/mL  may be an effective concentration for many patients. However, some  are best treated at concentrations outside of this range. Acetaminophen  concentrations >150 ug/mL at 4 hours after ingestion  and >50 ug/mL at 12 hours after ingestion are often associated with  toxic reactions.  Performed at [facility], [address], [place], [place] [address]    SARS Coronavirus 2 by RT PCR 05/27/2022 NEGATIVE  NEGATIVE Final   Influenza A by PCR 05/27/2022 NEGATIVE  NEGATIVE Final   Influenza B by PCR 05/27/2022 NEGATIVE  NEGATIVE Final   Comment: (NOTE) The Xpert Xpress SARS-CoV-2/FLU/RSV plus assay is intended as an aid in the diagnosis of influenza from Nasopharyngeal swab specimens and should not be used as a sole basis for treatment. Nasal washings  and aspirates are unacceptable for Xpert Xpress SARS-CoV-2/FLU/RSV testing.  Fact Sheet for Patients: BloggerCourse.com  Fact Sheet for Healthcare Providers: SeriousBroker.it  This test is not yet approved or cleared by the [place] FDA and has been authorized for detection and/or diagnosis of SARS-CoV-2 by FDA under an Emergency Use Authorization (EUA). This EUA will remain in effect (meaning this test can be used) for the duration of the COVID-19 declaration under Section 564(b)(1) of the Act, 21 U.S.C. section 360bbb-3(b)(1), unless the authorization is terminated or revoked.     Resp Syncytial Virus by PCR 05/27/2022 NEGATIVE  NEGATIVE Final   Comment: (NOTE) Fact Sheet for Patients: BloggerCourse.com  Fact Sheet for Healthcare Providers: SeriousBroker.it  This test is not yet approved or cleared by the [place] FDA and has been authorized for detection and/or diagnosis of SARS-CoV-2 by FDA under an Emergency Use Authorization (EUA). This EUA will remain in effect (meaning this test can be used) for the duration of the COVID-19 declaration under Section 564(b)(1) of the Act, 21 U.S.C. section 360bbb-3(b)(1), unless the authorization is terminated or revoked.  Performed at [facility], [address], [place], [place] [address]   Admission on 05/19/2022, Discharged on 05/21/2022  Component Date Value Ref Range Status   Sodium 05/19/2022 136  135 - 145 mmol/L Final   Potassium 05/19/2022 3.7  3.5 - 5.1 mmol/L Final   Chloride 05/19/2022 107  98 - 111 mmol/L Final   CO2 05/19/2022 22  22 - 32 mmol/L Final   Glucose, Bld 05/19/2022 99  70 - 99 mg/dL Final   Glucose reference range applies only to samples taken after fasting for at least 8 hours.   BUN 05/19/2022 9  6 - 20 mg/dL Final   Creatinine, Ser 05/19/2022 0.70  0.44 - 1.00 mg/dL Final   Calcium  66/44/0347 8.7 (L)  8.9 - 10.3 mg/dL Final   Total Protein 42/59/5638 7.3  6.5 - 8.1 g/dL Final   Albumin 75/64/3329 3.9  3.5 - 5.0 g/dL Final   AST 51/88/4166 21  15 - 41 U/L Final   ALT 05/19/2022 17  0 - 44 U/L Final   Alkaline Phosphatase 05/19/2022 72  38 - 126 U/L Final   Total Bilirubin 05/19/2022 0.4  0.3 - 1.2 mg/dL Final   GFR, Estimated 05/19/2022 >60  >60 mL/min Final   Comment: (NOTE) Calculated using the CKD-EPI Creatinine Equation (2021)    Anion gap 05/19/2022 7  5 - 15 Final   Performed at [facility], [address], [place], [place] [address]   Acetaminophen (Tylenol), Serum 05/19/2022 <10 (L)  10 - 30 ug/mL Final   Comment: (NOTE) Therapeutic concentrations vary significantly. A range of 10-30 ug/mL  may be an effective concentration for many patients. However, some  are best treated at concentrations outside  of this range. Acetaminophen concentrations >150 ug/mL at 4 hours after ingestion  and >50 ug/mL at 12 hours after ingestion are often associated with  toxic reactions.  Performed at [facility], [address] [place], [place] [address]    Salicylate Lvl 05/19/2022 <7.0 (L)  7.0 - 30.0 mg/dL Final   Performed at [facility], [address] [place], [place] [address]   WBC 05/19/2022 10.1  4.0 - 10.5 K/uL Final   RBC 05/19/2022 5.13 (H)  3.87 - 5.11 MIL/uL Final   Hemoglobin 05/19/2022 12.8  12.0 - 15.0 g/dL Final   HCT 57/84/6962 41.6  36.0 - 46.0 % Final   MCV 05/19/2022 81.1  80.0 - 100.0 fL Final   MCH 05/19/2022 25.0 (L)  26.0 - 34.0 pg Final   MCHC 05/19/2022 30.8  30.0 - 36.0 g/dL Final   RDW 95/28/4132 14.6  11.5 - 15.5 % Final   Platelets 05/19/2022 328  150 - 400 K/uL Final   nRBC 05/19/2022 0.0  0.0 - 0.2 % Final   Neutrophils Relative % 05/19/2022 66  % Final   Neutro Abs 05/19/2022 6.6  1.7 - 7.7 K/uL Final   Lymphocytes Relative 05/19/2022 28  % Final   Lymphs Abs 05/19/2022  2.8  0.7 - 4.0 K/uL Final   Monocytes Relative 05/19/2022 5  % Final   Monocytes Absolute 05/19/2022 0.5  0.1 - 1.0 K/uL Final   Eosinophils Relative 05/19/2022 1  % Final   Eosinophils Absolute 05/19/2022 0.1  0.0 - 0.5 K/uL Final   Basophils Relative 05/19/2022 0  % Final   Basophils Absolute 05/19/2022 0.0  0.0 - 0.1 K/uL Final   Immature Granulocytes 05/19/2022 0  % Final   Abs Immature Granulocytes 05/19/2022 0.02  0.00 - 0.07 K/uL Final   Performed at [facility], [address] [place], [place] [address]   Magnesium 05/19/2022 1.9  1.7 - 2.4 mg/dL Final   Performed at [facility], [address] [place], [place] [address]   Alcohol, Ethyl (B) 05/19/2022 <10  <10 mg/dL Final   Comment: (NOTE) Lowest detectable limit for serum alcohol is 10 mg/dL.  For medical purposes only. Performed at [facility], [address], [place], [place] [address]    I-stat hCG, quantitative 05/19/2022 <5.0  <5 mIU/mL Final   Comment 3 05/19/2022          Final   Comment:   GEST. AGE      CONC.  (mIU/mL)   <=1 WEEK        5 - 50     2 WEEKS       50 - 500     3 WEEKS       100 - 10,000     4 WEEKS     1,000 - 30,000        FEMALE AND NON-PREGNANT FEMALE:     LESS THAN 5 mIU/mL    Opiates 05/19/2022 NONE DETECTED  NONE DETECTED Final   Cocaine 05/19/2022 NONE DETECTED  NONE DETECTED Final   Benzodiazepines 05/19/2022 NONE DETECTED  NONE DETECTED Final   Amphetamines 05/19/2022 NONE DETECTED  NONE DETECTED Final   Tetrahydrocannabinol 05/19/2022 NONE DETECTED  NONE DETECTED Final   Barbiturates 05/19/2022 NONE DETECTED  NONE DETECTED Final   Comment: (NOTE) DRUG SCREEN FOR MEDICAL PURPOSES ONLY.  IF CONFIRMATION IS NEEDED FOR ANY PURPOSE, NOTIFY LAB WITHIN 5 DAYS.  LOWEST DETECTABLE  LIMITS FOR URINE DRUG SCREEN Drug Class                     Cutoff (ng/mL) Amphetamine and metabolites    1000 Barbiturate and metabolites     200 Benzodiazepine                 200 Opiates and metabolites        300 Cocaine and metabolites        300 THC                            50 Performed at [facility], [address] [place], [place] [address]    Acetaminophen (Tylenol), Serum 05/19/2022 <10 (L)  10 - 30 ug/mL Final   Comment: (NOTE) Therapeutic concentrations vary significantly. A range of 10-30 ug/mL  may be an effective concentration for many patients. However, some  are best treated at concentrations outside of this range. Acetaminophen concentrations >150 ug/mL at 4 hours after ingestion  and >50 ug/mL at 12 hours after ingestion are often associated with  toxic reactions.  Performed at [facility], [address] [place], [place] [address]    Sodium 05/19/2022 134 (L)  135 - 145 mmol/L Final   Potassium 05/19/2022 3.3 (L)  3.5 - 5.1 mmol/L Final   Chloride 05/19/2022 107  98 - 111 mmol/L Final   CO2 05/19/2022 23  22 - 32 mmol/L Final   Glucose, Bld 05/19/2022 103 (H)  70 - 99 mg/dL Final   Glucose reference range applies only to samples taken after fasting for at least 8 hours.   BUN 05/19/2022 8  6 - 20 mg/dL Final   Creatinine, Ser 05/19/2022 0.79  0.44 - 1.00 mg/dL Final   Calcium 09/81/1914 8.3 (L)  8.9 - 10.3 mg/dL Final   GFR, Estimated 05/19/2022 >60  >60 mL/min Final   Comment: (NOTE) Calculated using the CKD-EPI Creatinine Equation (2021)    Anion gap 05/19/2022 4 (L)  5 - 15 Final   Performed at [facility], [address], [place], [place] [address]   Magnesium 05/19/2022 2.3  1.7 - 2.4 mg/dL Final   Performed at [facility], [address] [place], [place] [address]   SARS Coronavirus 2 by RT PCR 05/21/2022 NEGATIVE  NEGATIVE Final   Comment: (NOTE) SARS-CoV-2 target nucleic acids are NOT DETECTED.  The SARS-CoV-2 RNA is generally detectable in upper and lower respiratory specimens during the acute phase of  infection. The lowest concentration of SARS-CoV-2 viral copies this assay can detect is 250 copies / mL. A negative result does not preclude SARS-CoV-2 infection and should not be used as the sole basis for treatment or other patient management decisions.  A negative result may occur with improper specimen collection / handling, submission of specimen other than nasopharyngeal swab, presence of viral mutation(s) within the areas targeted by this assay, and inadequate number of viral copies (<250 copies / mL). A negative result must be combined with clinical observations, patient history, and epidemiological information.  Fact Sheet for Patients:   RoadLapTop.co.za  Fact Sheet for Healthcare Providers: http://kim-miller.com/  This test is not yet approved or                           cleared by the [place] FDA and has been authorized for detection  and/or diagnosis of SARS-CoV-2 by FDA under an Emergency Use Authorization (EUA).  This EUA will remain in effect (meaning this test can be used) for the duration of the COVID-19 declaration under Section 564(b)(1) of the Act, 21 U.S.C. section 360bbb-3(b)(1), unless the authorization is terminated or revoked sooner.  Performed at [facility], [address] [place], [place] [address]   Admission on 05/15/2022, Discharged on 05/16/2022  Component Date Value Ref Range Status   SARS Coronavirus 2 by RT PCR 05/15/2022 NEGATIVE  NEGATIVE Final   Performed at [facility], [address] [place], [place] [address]   SARSCOV2ONAVIRUS 2 AG 05/16/2022 NEGATIVE  NEGATIVE Final   Comment: (NOTE) SARS-CoV-2 antigen NOT DETECTED.   Negative results are presumptive.  Negative results do not preclude SARS-CoV-2 infection and should not be used as the sole basis for treatment or other patient management decisions, including infection  control decisions, particularly in the  presence of clinical signs and  symptoms consistent with COVID-19, or in those who have been in contact with the virus.  Negative results must be combined with clinical observations, patient history, and epidemiological information. The expected result is Negative.  Fact Sheet for Patients: https://www.jennings-kim.com/  Fact Sheet for Healthcare Providers: https://alexander-rogers.biz/  This test is not yet approved or cleared by the [place] FDA and  has been authorized for detection and/or diagnosis of SARS-CoV-2 by FDA under an Emergency Use Authorization (EUA).  This EUA will remain in effect (meaning this test can be used) for the duration of  the COV                          ID-19 declaration under Section 564(b)(1) of the Act, 21 U.S.C. section 360bbb-3(b)(1), unless the authorization is terminated or revoked sooner.    Admission on 05/13/2022, Discharged on 05/13/2022  Component Date Value Ref Range Status   Sodium 05/13/2022 139  135 - 145 mmol/L Final   Potassium 05/13/2022 3.6  3.5 - 5.1 mmol/L Final   Chloride 05/13/2022 107  98 - 111 mmol/L Final   CO2 05/13/2022 24  22 - 32 mmol/L Final   Glucose, Bld 05/13/2022 98  70 - 99 mg/dL Final   Glucose reference range applies only to samples taken after fasting for at least 8 hours.   BUN 05/13/2022 6  6 - 20 mg/dL Final   Creatinine, Ser 05/13/2022 0.75  0.44 - 1.00 mg/dL Final   Calcium 82/95/6213 8.6 (L)  8.9 - 10.3 mg/dL Final   GFR, Estimated 05/13/2022 >60  >60 mL/min Final   Comment: (NOTE) Calculated using the CKD-EPI Creatinine Equation (2021)    Anion gap 05/13/2022 8  5 - 15 Final   Performed at [facility], [address] [place], [place] [address]   WBC 05/13/2022 7.6  4.0 - 10.5 K/uL Final   RBC 05/13/2022 4.38  3.87 - 5.11 MIL/uL Final   Hemoglobin 05/13/2022 10.9 (L)  12.0 - 15.0 g/dL Final   HCT 84/69/6295 35.4 (L)  36.0 - 46.0 % Final   MCV 05/13/2022 80.8  80.0  - 100.0 fL Final   MCH 05/13/2022 24.9 (L)  26.0 - 34.0 pg Final   MCHC 05/13/2022 30.8  30.0 - 36.0 g/dL Final   RDW 28/41/3244 14.6  11.5 - 15.5 % Final   Platelets 05/13/2022 248  150 - 400 K/uL Final   nRBC 05/13/2022 0.0  0.0 - 0.2 %  Final   Neutrophils Relative % 05/13/2022 60  % Final   Neutro Abs 05/13/2022 4.5  1.7 - 7.7 K/uL Final   Lymphocytes Relative 05/13/2022 32  % Final   Lymphs Abs 05/13/2022 2.4  0.7 - 4.0 K/uL Final   Monocytes Relative 05/13/2022 7  % Final   Monocytes Absolute 05/13/2022 0.5  0.1 - 1.0 K/uL Final   Eosinophils Relative 05/13/2022 1  % Final   Eosinophils Absolute 05/13/2022 0.1  0.0 - 0.5 K/uL Final   Basophils Relative 05/13/2022 0  % Final   Basophils Absolute 05/13/2022 0.0  0.0 - 0.1 K/uL Final   Immature Granulocytes 05/13/2022 0  % Final   Abs Immature Granulocytes 05/13/2022 0.03  0.00 - 0.07 K/uL Final   Performed at [facility], [address], [place], [place] [address]  Admission on 05/13/2022, Discharged on 05/13/2022  Component Date Value Ref Range Status   WBC 05/13/2022 10.1  4.0 - 10.5 K/uL Final   RBC 05/13/2022 4.70  3.87 - 5.11 MIL/uL Final   Hemoglobin 05/13/2022 11.9 (L)  12.0 - 15.0 g/dL Final   HCT 40/98/1191 38.4  36.0 - 46.0 % Final   MCV 05/13/2022 81.7  80.0 - 100.0 fL Final   MCH 05/13/2022 25.3 (L)  26.0 - 34.0 pg Final   MCHC 05/13/2022 31.0  30.0 - 36.0 g/dL Final   RDW 47/82/9562 14.8  11.5 - 15.5 % Final   Platelets 05/13/2022 266  150 - 400 K/uL Final   nRBC 05/13/2022 0.0  0.0 - 0.2 % Final   Performed at [facility], [address] [place], [place] [address]   Sodium 05/13/2022 140  135 - 145 mmol/L Final   Potassium 05/13/2022 3.5  3.5 - 5.1 mmol/L Final   Chloride 05/13/2022 104  98 - 111 mmol/L Final   CO2 05/13/2022 24  22 - 32 mmol/L Final   Glucose, Bld 05/13/2022 122 (H)  70 - 99 mg/dL Final   Glucose reference range applies only to samples taken after fasting for at least 8 hours.    BUN 05/13/2022 5 (L)  6 - 20 mg/dL Final   Creatinine, Ser 05/13/2022 0.85  0.44 - 1.00 mg/dL Final   Calcium 57/84/6962 9.0  8.9 - 10.3 mg/dL Final   Total Protein 95/28/4132 6.8  6.5 - 8.1 g/dL Final   Albumin 44/02/270 3.6  3.5 - 5.0 g/dL Final   AST 53/66/4403 24  15 - 41 U/L Final   ALT 05/13/2022 19  0 - 44 U/L Final   Alkaline Phosphatase 05/13/2022 78  38 - 126 U/L Final   Total Bilirubin 05/13/2022 0.5  0.3 - 1.2 mg/dL Final   GFR, Estimated 05/13/2022 >60  >60 mL/min Final   Comment: (NOTE) Calculated using the CKD-EPI Creatinine Equation (2021)    Anion gap 05/13/2022 12  5 - 15 Final   Performed at [facility], [address], [place], [place] [address]   TSH 05/13/2022 1.353  0.350 - 4.500 uIU/mL Final   Comment: Performed by a 3rd Generation assay with a functional sensitivity of <=0.01 uIU/mL. Performed at [facility], [address] [place], [place] [address]    D-Dimer, Quant 05/13/2022 <0.27  0.00 - 0.50 ug/mL-FEU Final   Comment: (NOTE) At the manufacturer cut-off value of 0.5 g/mL FEU, this assay has a negative predictive value of 95-100%.This assay is intended for use in conjunction with a clinical pretest probability (PTP)  assessment model to exclude pulmonary embolism (PE) and deep venous thrombosis (DVT) in outpatients suspected of PE or DVT. Results should be correlated with clinical presentation. Performed at [facility], [address] [place], [place] [address]    I-stat hCG, quantitative 05/13/2022 <5.0  <5 mIU/mL Final   Comment 3 05/13/2022          Final   Comment:   GEST. AGE      CONC.  (mIU/mL)   <=1 WEEK        5 - 50     2 WEEKS       50 - 500     3 WEEKS       100 - 10,000     4 WEEKS     1,000 - 30,000        FEMALE AND NON-PREGNANT FEMALE:     LESS THAN 5 mIU/mL   Admission on 05/09/2022, Discharged on 05/10/2022  Component Date Value Ref Range Status   Sodium 05/10/2022 137  135 - 145 mmol/L Final   Potassium  05/10/2022 3.9  3.5 - 5.1 mmol/L Final   Chloride 05/10/2022 103  98 - 111 mmol/L Final   CO2 05/10/2022 20 (L)  22 - 32 mmol/L Final   Glucose, Bld 05/10/2022 119 (H)  70 - 99 mg/dL Final   Glucose reference range applies only to samples taken after fasting for at least 8 hours.   BUN 05/10/2022 7  6 - 20 mg/dL Final   Creatinine, Ser 05/10/2022 0.67  0.44 - 1.00 mg/dL Final   Calcium 60/45/4098 9.0  8.9 - 10.3 mg/dL Final   GFR, Estimated 05/10/2022 >60  >60 mL/min Final   Comment: (NOTE) Calculated using the CKD-EPI Creatinine Equation (2021)    Anion gap 05/10/2022 14  5 - 15 Final   Performed at [facility], [address] [place], [place] [address]   WBC 05/10/2022 9.1  4.0 - 10.5 K/uL Final   RBC 05/10/2022 4.52  3.87 - 5.11 MIL/uL Final   Hemoglobin 05/10/2022 11.5 (L)  12.0 - 15.0 g/dL Final   HCT 78/29/5621 37.2  36.0 - 46.0 % Final   MCV 05/10/2022 82.3  80.0 - 100.0 fL Final   MCH 05/10/2022 25.4 (L)  26.0 - 34.0 pg Final   MCHC 05/10/2022 30.9  30.0 - 36.0 g/dL Final   RDW 30/86/5784 14.5  11.5 - 15.5 % Final   Platelets 05/10/2022 283  150 - 400 K/uL Final   nRBC 05/10/2022 0.0  0.0 - 0.2 % Final   Performed at [facility], [address] [place], [place] [address]   Troponin I (High Sensitivity) 05/10/2022 <2  <18 ng/L Final   Comment: (NOTE) Elevated high sensitivity troponin I (hsTnI) values and significant  changes across serial measurements may suggest ACS but many other  chronic and acute conditions are known to elevate hsTnI results.  Refer to the "Links" section for chest pain algorithms and additional  guidance. Performed at [facility], [address] [place], [place] [address]    Troponin I (High Sensitivity) 05/10/2022 2  <18 ng/L Final   Comment: (NOTE) Elevated high sensitivity troponin I (hsTnI) values and significant  changes across serial measurements may suggest ACS but many other  chronic and acute conditions are known to  elevate hsTnI results.  Refer to the "Links" section for chest pain algorithms and additional  guidance. Performed at [facility],  [address], [place], [place] [address]   There may be more visits with results that are not included.    Allergies: Bee venom, Coconut flavor, Fish allergy, Geodon [ziprasidone hcl], Haloperidol and related, Lithobid [lithium], Roxicodone [oxycodone], Seroquel [quetiapine], Shellfish allergy, Phenergan [promethazine hcl], Prilosec [omeprazole], Sulfa antibiotics, Tegretol [carbamazepine], Prozac [fluoxetine], Tape, and Tylenol [acetaminophen]  Medications:  Facility Ordered Medications  Medication   alum & mag hydroxide-simeth (MAALOX/MYLANTA) 200-200-20 MG/5ML suspension 30 mL   magnesium hydroxide (MILK OF MAGNESIA) suspension 30 mL   OLANZapine zydis (ZYPREXA) disintegrating tablet 10 mg   And   LORazepam (ATIVAN) tablet 1 mg   prazosin (MINIPRESS) capsule 1 mg   pantoprazole (PROTONIX) EC tablet 40 mg   OLANZapine zydis (ZYPREXA) disintegrating tablet 10 mg   melatonin tablet 3 mg   hydrOXYzine (ATARAX) tablet 25 mg   gabapentin (NEURONTIN) capsule 600 mg   escitalopram (LEXAPRO) tablet 20 mg   cyclobenzaprine (FLEXERIL) tablet 10 mg   mometasone-formoterol (DULERA) 200-5 MCG/ACT inhaler 2 puff   albuterol (VENTOLIN HFA) 108 (90 Base) MCG/ACT inhaler 2 puff   PTA Medications  Medication Sig   albuterol (VENTOLIN HFA) 108 (90 Base) MCG/ACT inhaler Inhale 2 puffs into the lungs every 6 (six) hours as needed for wheezing or shortness of breath.   BREYNA 160-4.5 MCG/ACT inhaler Inhale 1 puff into the lungs in the morning and at bedtime.   traZODone (DESYREL) 50 MG tablet Take 1 tablet (50 mg total) by mouth at bedtime as needed for up to 7 days for sleep. (Patient taking differently: Take 50 mg by mouth at bedtime.)   cyclobenzaprine (FLEXERIL) 10 MG tablet Take 10 mg by mouth at bedtime.   diclofenac Sodium (VOLTAREN) 1 % GEL Apply 2 g  topically 2 (two) times daily.   hydrOXYzine (ATARAX) 25 MG tablet Take 1 tablet (25 mg total) by mouth 3 (three) times daily as needed for anxiety.   melatonin 3 MG TABS tablet Take 1 tablet (3 mg total) by mouth at bedtime.   pantoprazole (PROTONIX) 40 MG tablet Take 40 mg by mouth daily.   prazosin (MINIPRESS) 1 MG capsule Take 1 mg by mouth at bedtime.   escitalopram (LEXAPRO) 20 MG tablet Take 1 tablet (20 mg total) by mouth daily.   gabapentin (NEURONTIN) 300 MG capsule Take 2 capsules (600 mg total) by mouth 3 (three) times daily.   OLANZapine zydis (ZYPREXA) 10 MG disintegrating tablet Take 1 tablet (10 mg total) by mouth at bedtime.      Medical Decision Making  Inpatient observation Scheduled Meds:  cyclobenzaprine  10 mg Oral QHS   escitalopram  20 mg Oral Daily   gabapentin  600 mg Oral TID   melatonin  3 mg Oral QHS   mometasone-formoterol  2 puff Inhalation BID   OLANZapine zydis  10 mg Oral QHS   pantoprazole  40 mg Oral Daily   prazosin  1 mg Oral QHS   Continuous Infusions: PRN Meds:.albuterol, alum & mag hydroxide-simeth, hydrOXYzine, OLANZapine zydis **AND** LORazepam, magnesium hydroxide    Lab Orders         POC urine preg, ED         POCT Urine Drug Screen - (I-Screen)         Pregnancy, urine POC     Recommendations  Based on my evaluation the patient appears to have an emergency medical condition for which I recommend the patient be transferred to the emergency department for further evaluation.  Channing Mutters  Mayford Knife, NP 07/06/22  5:56 AM

## 2022-07-06 NOTE — Progress Notes (Signed)
Pt was accepted to [facility] TODAY 07/06/2022. Bed assignment: 400-1  Pt meets inpatient criteria per Liborio Nixon, NP  Attending Physician will be Phineas Inches, MD  Report can be called to: - Adult unit: [phone]  Pt can arrive after 1 PM  Care Team Notified: [facility] Shopiere, RN, Liborio Nixon, NP, Durwin Reges, RN, and Gretta Cool, MD  Cathie Beams, LCSW  07/06/2022 12:43 PM

## 2022-07-06 NOTE — ED Provider Notes (Signed)
FBC/OBS ASAP Discharge Summary  Date and Time: 07/06/2022 9:51 AM Name: [name] MRN:  [id number]  Subjective:  Patient seen and evaluated face to face by this provider, chart review and case discussed with Dr. Viviano Simas. On evaluation, patient is alert and oriented x 4. Her thought process is linear and speech is coherent at a moderate tone. Her mood is depressed and affect is congruent. She has fair eye contact. She is calm and cooperative and does not appear to be in acute distress. Patient continues to endorse suicidal ideations with a plan to cut her throat with a knife. She endorses suicidal intent and means to carry out plan. She is unable to contract for safety. She reports several past suicide attempts by overdose or cutting herself. She reports that she last attempted suicide in April 2024 by cutting her arm. She reports a past history of self injurious behaviors by cutting but reports no recent self-harm behaviors. She denies homicidal ideations.  She denies auditory or visual hallucinations. There is no objective evidence that the patient is currently responding to internal or external stimuli. She reports worsening depression for the past month. She describes her depressive symptoms as feeling low, sadness, crying spells, isolating, and decreased energy. She reports fair sleep. She reports a fair appetite. She identifies current stressors as being denied by Social Security yesterday for her disability benefits. She states that if she cannot pay her bills and that life is not worth living for. She states that she cannot work due to her mental illness. She states that she without her benefits would not be able to afford to live and that she is going to lose her home. She is followed by the [facility]. She states that she last followed up with the [facility] on wednesday and that she was scheduled for a visit today.   Stay Summary: Per chart review HPI, [name],  32 y/o female with a  history of schizoaffective dx, adjustment dx, SI, bipolar dx, and PTSD. presented to [facility] via [facility] for suicidal ideation. Per the patient she is suicidal with plans to OD on her medication because Social Security denied her re-certification for her benefit which is has been getting since she was 32 year old.  Pt has an extensive ER visit for and most recently patient was hospitalize at [facility] from 06/18/22-06/21/22.    Diagnosis:  Final diagnoses:  Suicidal ideation  Ineffective coping  Schizoaffective disorder, depressive type (HCC)   Total Time spent with patient: 30 minutes  Past Psychiatric History: History of borderline personality disorder, schizoaffective disorder, bipolar type, PTSD, GAD, self-injurious behaviors, and past suicide attempts. Patient recently hospitalized at [facility] from 06/18/22-06/21/2022.   Past Medical History: History of fibromyalgia and asthma   Family Psychiatric  History: No known history reported.  Social History: Patient resides with her ex-boyfriend Fayrene Fearing. Patient denies drinking alcohol or using illicit drugs. Patient reports volunteering at [facility]. Patient has a daughter that is not in her custody.    Current Medications:  Current Facility-Administered Medications  Medication Dose Route Frequency Provider Last Rate Last Admin   albuterol (VENTOLIN HFA) 108 (90 Base) MCG/ACT inhaler 2 puff  2 puff Inhalation Q6H PRN Sindy Guadeloupe, NP       alum & mag hydroxide-simeth (MAALOX/MYLANTA) 200-200-20 MG/5ML suspension 30 mL  30 mL Oral Q4H PRN Sindy Guadeloupe, NP       cyclobenzaprine (FLEXERIL) tablet 10 mg  10 mg Oral QHS Sindy Guadeloupe,  NP       escitalopram (LEXAPRO) tablet 20 mg  20 mg Oral Daily Sindy Guadeloupe, NP   20 mg at 07/06/22 1914   gabapentin (NEURONTIN) capsule 600 mg  600 mg Oral TID Sindy Guadeloupe, NP   600 mg at 07/06/22 7829   hydrOXYzine (ATARAX) tablet 25 mg  25 mg Oral TID PRN Sindy Guadeloupe, NP       OLANZapine  zydis (ZYPREXA) disintegrating tablet 10 mg  10 mg Oral Q8H PRN Sindy Guadeloupe, NP       And   LORazepam (ATIVAN) tablet 1 mg  1 mg Oral PRN Sindy Guadeloupe, NP       magnesium hydroxide (MILK OF MAGNESIA) suspension 30 mL  30 mL Oral Daily PRN Sindy Guadeloupe, NP       melatonin tablet 3 mg  3 mg Oral QHS Sindy Guadeloupe, NP       mometasone-formoterol [employer]) 200-5 MCG/ACT inhaler 2 puff  2 puff Inhalation BID Sindy Guadeloupe, NP   2 puff at 07/06/22 0929   OLANZapine zydis (ZYPREXA) disintegrating tablet 10 mg  10 mg Oral QHS Sindy Guadeloupe, NP       pantoprazole (PROTONIX) EC tablet 40 mg  40 mg Oral Daily Sindy Guadeloupe, NP   40 mg at 07/06/22 5621   prazosin (MINIPRESS) capsule 1 mg  1 mg Oral QHS Sindy Guadeloupe, NP       Current Outpatient Medications  Medication Sig Dispense Refill   gabapentin (NEURONTIN) 600 MG tablet Take 600 mg by mouth 3 (three) times daily.     VRAYLAR 4.5 MG CAPS Take 4.5 mg by mouth daily.     albuterol (VENTOLIN HFA) 108 (90 Base) MCG/ACT inhaler Inhale 2 puffs into the lungs every 6 (six) hours as needed for wheezing or shortness of breath.     BREYNA 160-4.5 MCG/ACT inhaler Inhale 1 puff into the lungs in the morning and at bedtime.     cyclobenzaprine (FLEXERIL) 10 MG tablet Take 10 mg by mouth at bedtime.     diclofenac Sodium (VOLTAREN) 1 % GEL Apply 2 g topically 2 (two) times daily. 2 g 0   escitalopram (LEXAPRO) 20 MG tablet Take 1 tablet (20 mg total) by mouth daily.     gabapentin (NEURONTIN) 300 MG capsule Take 2 capsules (600 mg total) by mouth 3 (three) times daily.     hydrOXYzine (ATARAX) 25 MG tablet Take 1 tablet (25 mg total) by mouth 3 (three) times daily as needed for anxiety. 30 tablet 0   melatonin 3 MG TABS tablet Take 1 tablet (3 mg total) by mouth at bedtime. 30 tablet 0   OLANZapine zydis (ZYPREXA) 10 MG disintegrating tablet Take 1 tablet (10 mg total) by mouth at bedtime.     pantoprazole (PROTONIX) 40 MG tablet Take 40 mg by mouth daily.      prazosin (MINIPRESS) 1 MG capsule Take 1 mg by mouth at bedtime.     traZODone (DESYREL) 50 MG tablet Take 1 tablet (50 mg total) by mouth at bedtime as needed for up to 7 days for sleep. (Patient taking differently: Take 50 mg by mouth at bedtime.) 7 tablet 0    Labs  Lab Results:  Admission on 07/06/2022  Component Date Value Ref Range Status   Preg Test, Ur 07/06/2022 Negative  Negative Final   POC Amphetamine UR 07/06/2022 None Detected  NONE DETECTED (Cut Off Level 1000 ng/mL) Final   POC Secobarbital (BAR) 07/06/2022 None Detected  NONE DETECTED (Cut Off Level 300 ng/mL) Final   POC Buprenorphine (BUP) 07/06/2022 None Detected  NONE DETECTED (Cut Off Level 10 ng/mL) Final   POC Oxazepam (BZO) 07/06/2022 Positive (A)  NONE DETECTED (Cut Off Level 300 ng/mL) Final   POC Cocaine UR 07/06/2022 None Detected  NONE DETECTED (Cut Off Level 300 ng/mL) Final   POC Methamphetamine UR 07/06/2022 None Detected  NONE DETECTED (Cut Off Level 1000 ng/mL) Final   POC Morphine 07/06/2022 None Detected  NONE DETECTED (Cut Off Level 300 ng/mL) Final   POC Methadone UR 07/06/2022 None Detected  NONE DETECTED (Cut Off Level 300 ng/mL) Final   POC Oxycodone UR 07/06/2022 None Detected  NONE DETECTED (Cut Off Level 100 ng/mL) Final   POC Marijuana UR 07/06/2022 None Detected  NONE DETECTED (Cut Off Level 50 ng/mL) Final   Preg Test, Ur 07/06/2022 NEGATIVE  NEGATIVE Final   Comment:        THE SENSITIVITY OF THIS METHODOLOGY IS >24 mIU/mL   Admission on 06/22/2022, Discharged on 06/23/2022  Component Date Value Ref Range Status   WBC 06/22/2022 8.1  4.0 - 10.5 K/uL Final   RBC 06/22/2022 4.45  3.87 - 5.11 MIL/uL Final   Hemoglobin 06/22/2022 10.8 (L)  12.0 - 15.0 g/dL Final   HCT 16/11/9602 34.7 (L)  36.0 - 46.0 % Final   MCV 06/22/2022 78.0 (L)  80.0 - 100.0 fL Final   MCH 06/22/2022 24.3 (L)  26.0 - 34.0 pg Final   MCHC 06/22/2022 31.1  30.0 - 36.0 g/dL Final   RDW 54/10/8117 14.4  11.5 - 15.5 %  Final   Platelets 06/22/2022 299  150 - 400 K/uL Final   nRBC 06/22/2022 0.0  0.0 - 0.2 % Final   Neutrophils Relative % 06/22/2022 63  % Final   Neutro Abs 06/22/2022 5.1  1.7 - 7.7 K/uL Final   Lymphocytes Relative 06/22/2022 30  % Final   Lymphs Abs 06/22/2022 2.4  0.7 - 4.0 K/uL Final   Monocytes Relative 06/22/2022 6  % Final   Monocytes Absolute 06/22/2022 0.5  0.1 - 1.0 K/uL Final   Eosinophils Relative 06/22/2022 1  % Final   Eosinophils Absolute 06/22/2022 0.1  0.0 - 0.5 K/uL Final   Basophils Relative 06/22/2022 0  % Final   Basophils Absolute 06/22/2022 0.0  0.0 - 0.1 K/uL Final   Immature Granulocytes 06/22/2022 0  % Final   Abs Immature Granulocytes 06/22/2022 0.03  0.00 - 0.07 K/uL Final   Performed at [facility], [address] [place], [place] [address]   Sodium 06/22/2022 138  135 - 145 mmol/L Final   Potassium 06/22/2022 3.6  3.5 - 5.1 mmol/L Final   Chloride 06/22/2022 104  98 - 111 mmol/L Final   CO2 06/22/2022 22  22 - 32 mmol/L Final   Glucose, Bld 06/22/2022 93  70 - 99 mg/dL Final   Glucose reference range applies only to samples taken after fasting for at least 8 hours.   BUN 06/22/2022 5 (L)  6 - 20 mg/dL Final   Creatinine, Ser 06/22/2022 0.74  0.44 - 1.00 mg/dL Final   Calcium 95/62/1308 9.2  8.9 - 10.3 mg/dL Final   Total Protein 65/78/4696 6.6  6.5 - 8.1 g/dL Final   Albumin 29/52/8413 3.9  3.5 - 5.0 g/dL Final   AST 24/40/1027 16  15 - 41 U/L Final   ALT 06/22/2022 16  0 - 44 U/L Final   Alkaline  Phosphatase 06/22/2022 64  38 - 126 U/L Final   Total Bilirubin 06/22/2022 0.3  0.3 - 1.2 mg/dL Final   GFR, Estimated 06/22/2022 >60  >60 mL/min Final   Comment: (NOTE) Calculated using the CKD-EPI Creatinine Equation (2021)    Anion gap 06/22/2022 12  5 - 15 Final   Performed at [facility], [address], [place], [place] [address]   Alcohol, Ethyl (B) 06/22/2022 <10  <10 mg/dL Final   Comment: (NOTE) Lowest detectable limit for serum  alcohol is 10 mg/dL.  For medical purposes only. Performed at [facility], [address], [place], [place] [address]    Preg Test, Ur 06/22/2022 Negative  Negative Final   POC Amphetamine UR 06/22/2022 None Detected  NONE DETECTED (Cut Off Level 1000 ng/mL) Final   POC Secobarbital (BAR) 06/22/2022 None Detected  NONE DETECTED (Cut Off Level 300 ng/mL) Final   POC Buprenorphine (BUP) 06/22/2022 None Detected  NONE DETECTED (Cut Off Level 10 ng/mL) Final   POC Oxazepam (BZO) 06/22/2022 Positive (A)  NONE DETECTED (Cut Off Level 300 ng/mL) Final   POC Cocaine UR 06/22/2022 None Detected  NONE DETECTED (Cut Off Level 300 ng/mL) Final   POC Methamphetamine UR 06/22/2022 None Detected  NONE DETECTED (Cut Off Level 1000 ng/mL) Final   POC Morphine 06/22/2022 None Detected  NONE DETECTED (Cut Off Level 300 ng/mL) Final   POC Methadone UR 06/22/2022 None Detected  NONE DETECTED (Cut Off Level 300 ng/mL) Final   POC Oxycodone UR 06/22/2022 None Detected  NONE DETECTED (Cut Off Level 100 ng/mL) Final   POC Marijuana UR 06/22/2022 None Detected  NONE DETECTED (Cut Off Level 50 ng/mL) Final  Admission on 06/16/2022, Discharged on 06/18/2022  Component Date Value Ref Range Status   Sodium 06/16/2022 139  135 - 145 mmol/L Final   Potassium 06/16/2022 3.5  3.5 - 5.1 mmol/L Final   Chloride 06/16/2022 109  98 - 111 mmol/L Final   CO2 06/16/2022 21 (L)  22 - 32 mmol/L Final   Glucose, Bld 06/16/2022 90  70 - 99 mg/dL Final   Glucose reference range applies only to samples taken after fasting for at least 8 hours.   BUN 06/16/2022 10  6 - 20 mg/dL Final   Creatinine, Ser 06/16/2022 0.71  0.44 - 1.00 mg/dL Final   Calcium 09/81/1914 8.7 (L)  8.9 - 10.3 mg/dL Final   Total Protein 78/29/5621 6.8  6.5 - 8.1 g/dL Final   Albumin 30/86/5784 3.7  3.5 - 5.0 g/dL Final   AST 69/62/9528 18  15 - 41 U/L Final   ALT 06/16/2022 18  0 - 44 U/L Final   Alkaline Phosphatase 06/16/2022 59  38 - 126 U/L Final    Total Bilirubin 06/16/2022 0.4  0.3 - 1.2 mg/dL Final   GFR, Estimated 06/16/2022 >60  >60 mL/min Final   Comment: (NOTE) Calculated using the CKD-EPI Creatinine Equation (2021)    Anion gap 06/16/2022 9  5 - 15 Final   Performed at [facility], [address], [place], [place] [address]   Alcohol, Ethyl (B) 06/16/2022 <10  <10 mg/dL Final   Comment: (NOTE) Lowest detectable limit for serum alcohol is 10 mg/dL.  For medical purposes only. Performed at [facility], [address] [place], [place] [address]    Salicylate Lvl 06/16/2022 <7.0 (L)  7.0 - 30.0 mg/dL Final   Performed at [facility], [address] [place], [place] [address]   Acetaminophen (Tylenol), Serum 06/16/2022 <10 (L)  10 - 30 ug/mL Final   Comment: (NOTE) Therapeutic concentrations vary significantly. A range of 10-30 ug/mL  may be an effective concentration for many patients. However, some  are best treated at concentrations outside of this range. Acetaminophen concentrations >150 ug/mL at 4 hours after ingestion  and >50 ug/mL at 12 hours after ingestion are often associated with  toxic reactions.  Performed at [facility], [address], [place], [place] [address]    WBC 06/16/2022 10.6 (H)  4.0 - 10.5 K/uL Final   RBC 06/16/2022 4.14  3.87 - 5.11 MIL/uL Final   Hemoglobin 06/16/2022 10.3 (L)  12.0 - 15.0 g/dL Final   HCT 91/47/8295 33.5 (L)  36.0 - 46.0 % Final   MCV 06/16/2022 80.9  80.0 - 100.0 fL Final   MCH 06/16/2022 24.9 (L)  26.0 - 34.0 pg Final   MCHC 06/16/2022 30.7  30.0 - 36.0 g/dL Final   RDW 62/13/0865 14.4  11.5 - 15.5 % Final   Platelets 06/16/2022 276  150 - 400 K/uL Final   nRBC 06/16/2022 0.0  0.0 - 0.2 % Final   Performed at [facility], [address] [place], [place] [address]   Opiates 06/17/2022 NONE DETECTED  NONE DETECTED Final   Cocaine 06/17/2022 NONE DETECTED  NONE  DETECTED Final   Benzodiazepines 06/17/2022 NONE DETECTED  NONE DETECTED Final   Amphetamines 06/17/2022 NONE DETECTED  NONE DETECTED Final   Tetrahydrocannabinol 06/17/2022 NONE DETECTED  NONE DETECTED Final   Barbiturates 06/17/2022 NONE DETECTED  NONE DETECTED Final   Comment: (NOTE) DRUG SCREEN FOR MEDICAL PURPOSES ONLY.  IF CONFIRMATION IS NEEDED FOR ANY PURPOSE, NOTIFY LAB WITHIN 5 DAYS.  LOWEST DETECTABLE LIMITS FOR URINE DRUG SCREEN Drug Class                     Cutoff (ng/mL) Amphetamine and metabolites    1000 Barbiturate and metabolites    200 Benzodiazepine                 200 Opiates and metabolites        300 Cocaine and metabolites        300 THC                            50 Performed at [facility], [address]  [place], [place] [address]    I-stat hCG, quantitative 06/16/2022 <5.0  <5 mIU/mL Final   Comment 3 06/16/2022          Final   Comment:   GEST. AGE      CONC.  (mIU/mL)   <=1 WEEK        5 - 50     2 WEEKS       50 - 500     3 WEEKS       100 - 10,000     4 WEEKS     1,000 - 30,000        FEMALE AND NON-PREGNANT FEMALE:     LESS THAN 5 mIU/mL   Admission on 06/04/2022, Discharged on 06/07/2022  Component Date Value Ref Range Status   Glucose-Capillary 06/04/2022 109 (H)  70 - 99 mg/dL Final   Glucose reference range applies only to samples taken after fasting for at least 8 hours.   Sodium 06/04/2022 138  135 - 145 mmol/L Final   Potassium 06/04/2022 3.3 (L)  3.5 - 5.1 mmol/L Final   Chloride 06/04/2022 108  98 - 111 mmol/L Final   CO2 06/04/2022 23  22 - 32 mmol/L Final   Glucose, Bld 06/04/2022 119 (H)  70 - 99 mg/dL Final   Glucose reference range applies only to samples taken after fasting for at least 8 hours.   BUN 06/04/2022 10  6 - 20 mg/dL Final   Creatinine, Ser 06/04/2022 0.79  0.44 - 1.00 mg/dL Final   Calcium 86/57/8469 8.7 (L)  8.9 - 10.3 mg/dL Final   Total Protein 62/95/2841 7.2  6.5 - 8.1 g/dL Final    Albumin 32/44/0102 3.7  3.5 - 5.0 g/dL Final   AST 72/53/6644 19  15 - 41 U/L Final   ALT 06/04/2022 15  0 - 44 U/L Final   Alkaline Phosphatase 06/04/2022 68  38 - 126 U/L Final   Total Bilirubin 06/04/2022 0.5  0.3 - 1.2 mg/dL Final   GFR, Estimated 06/04/2022 >60  >60 mL/min Final   Comment: (NOTE) Calculated using the CKD-EPI Creatinine Equation (2021)    Anion gap 06/04/2022 7  5 - 15 Final   Performed at [facility], [address], [place], [place] [address]   Salicylate Lvl 06/04/2022 <7.0 (L)  7.0 - 30.0 mg/dL Final   Performed at [facility], [address] [place], [place] [address]   Acetaminophen (Tylenol), Serum 06/04/2022 <10 (L)  10 - 30 ug/mL Final   Comment: (NOTE) Therapeutic concentrations vary significantly. A range of 10-30 ug/mL  may be an effective concentration for many patients. However, some  are best treated at concentrations outside of this range. Acetaminophen concentrations >150 ug/mL at 4 hours after ingestion  and >50 ug/mL at 12 hours after ingestion are often associated with  toxic reactions.  Performed at [facility], [address], [place], [place] [address]    Alcohol, Ethyl (B) 06/04/2022 <10  <10 mg/dL Final   Comment: (NOTE) Lowest detectable limit for serum alcohol is 10 mg/dL.  For medical purposes only. Performed at [facility], [address], [place], [place] [address]    Opiates 06/05/2022 NONE DETECTED  NONE DETECTED Final   Cocaine 06/05/2022 NONE DETECTED  NONE DETECTED Final   Benzodiazepines 06/05/2022 NONE DETECTED  NONE DETECTED Final   Amphetamines 06/05/2022 NONE DETECTED  NONE DETECTED Final   Tetrahydrocannabinol 06/05/2022 NONE DETECTED  NONE DETECTED Final   Barbiturates 06/05/2022 NONE DETECTED  NONE DETECTED Final   Comment: (NOTE) DRUG SCREEN FOR MEDICAL PURPOSES ONLY.  IF CONFIRMATION IS NEEDED FOR ANY PURPOSE, NOTIFY LAB WITHIN 5  DAYS.  LOWEST DETECTABLE LIMITS FOR URINE DRUG SCREEN Drug Class                     Cutoff (ng/mL) Amphetamine and metabolites    1000 Barbiturate and metabolites    200 Benzodiazepine                 200 Opiates and metabolites        300 Cocaine and metabolites        300 THC                            50 Performed at [facility], [address], [place], [place] [address]    WBC 06/04/2022 6.6  4.0 - 10.5 K/uL Final   RBC 06/04/2022 4.32  3.87 - 5.11 MIL/uL Final   Hemoglobin 06/04/2022 10.9 (L)  12.0 - 15.0 g/dL Final   HCT 16/11/9602 34.6 (L)  36.0 - 46.0 % Final   MCV 06/04/2022 80.1  80.0 - 100.0 fL Final   MCH 06/04/2022 25.2 (L)  26.0 - 34.0 pg Final   MCHC 06/04/2022 31.5  30.0 - 36.0 g/dL Final   RDW 54/10/8117 14.1  11.5 - 15.5 % Final   Platelets 06/04/2022 254  150 - 400 K/uL Final   nRBC 06/04/2022 0.0  0.0 - 0.2 % Final   Neutrophils Relative % 06/04/2022 58  % Final   Neutro Abs 06/04/2022 3.8  1.7 - 7.7 K/uL Final   Lymphocytes Relative 06/04/2022 36  % Final   Lymphs Abs 06/04/2022 2.4  0.7 - 4.0 K/uL Final   Monocytes Relative 06/04/2022 5  % Final   Monocytes Absolute 06/04/2022 0.3  0.1 - 1.0 K/uL Final   Eosinophils Relative 06/04/2022 1  % Final   Eosinophils Absolute 06/04/2022 0.0  0.0 - 0.5 K/uL Final   Basophils Relative 06/04/2022 0  % Final   Basophils Absolute 06/04/2022 0.0  0.0 - 0.1 K/uL Final   Immature Granulocytes 06/04/2022 0  % Final   Abs Immature Granulocytes 06/04/2022 0.02  0.00 - 0.07 K/uL Final   Performed at [facility], [address] [place], [place] [address]   I-stat hCG, quantitative 06/05/2022 <5.0  <5 mIU/mL Final   Comment 3 06/05/2022          Final   Comment:   GEST. AGE      CONC.  (mIU/mL)   <=1 WEEK        5 - 50     2 WEEKS       50 - 500     3 WEEKS       100 - 10,000     4 WEEKS     1,000 - 30,000        FEMALE AND NON-PREGNANT FEMALE:     LESS THAN 5 mIU/mL    Sodium  06/05/2022 142  135 - 145 mmol/L Final   Potassium 06/05/2022 3.5  3.5 - 5.1 mmol/L Final   Chloride 06/05/2022 106  98 - 111 mmol/L Final   BUN 06/05/2022 10  6 - 20 mg/dL Final   Creatinine, Ser 06/05/2022 0.70  0.44 - 1.00 mg/dL Final   Glucose, Bld 95/62/1308 107 (H)  70 - 99 mg/dL Final   Glucose reference range applies only to samples taken after fasting for at least 8 hours.   Calcium, Ion 06/05/2022 1.24  1.15 - 1.40 mmol/L Final   TCO2 06/05/2022 26  22 - 32 mmol/L Final   Hemoglobin 06/05/2022 10.9 (L)  12.0 - 15.0 g/dL Final   HCT 65/78/4696 32.0 (L)  36.0 - 46.0 % Final   Magnesium 06/04/2022 2.1  1.7 - 2.4 mg/dL Final   Performed at [facility], [address], [place], [place] [address]   Acetaminophen (Tylenol), Serum 06/05/2022 <10 (L)  10 - 30 ug/mL Final   Comment: (NOTE) Therapeutic concentrations vary significantly. A range of 10-30 ug/mL  may be an effective concentration for many patients. However, some  are best treated at concentrations outside of this range. Acetaminophen concentrations >150 ug/mL at 4 hours after ingestion  and >50 ug/mL at 12 hours after ingestion are often associated with  toxic  reactions.  Performed at [facility], [address] [place], [place] [address]    WBC 06/05/2022 7.1  4.0 - 10.5 K/uL Final   RBC 06/05/2022 4.11  3.87 - 5.11 MIL/uL Final   Hemoglobin 06/05/2022 10.5 (L)  12.0 - 15.0 g/dL Final   HCT 78/46/9629 33.8 (L)  36.0 - 46.0 % Final   MCV 06/05/2022 82.2  80.0 - 100.0 fL Final   MCH 06/05/2022 25.5 (L)  26.0 - 34.0 pg Final   MCHC 06/05/2022 31.1  30.0 - 36.0 g/dL Final   RDW 52/84/1324 14.5  11.5 - 15.5 % Final   Platelets 06/05/2022 259  150 - 400 K/uL Final   nRBC 06/05/2022 0.0  0.0 - 0.2 % Final   Performed at [facility], [address], [place], [place] [address]   Creatinine, Ser 06/05/2022 0.69  0.44 - 1.00 mg/dL Final   GFR, Estimated 06/05/2022  >60  >60 mL/min Final   Comment: (NOTE) Calculated using the CKD-EPI Creatinine Equation (2021) Performed at [facility], [name], [place], [place] [address]    Vitamin B-12 06/05/2022 3,066 (H)  180 - 914 pg/mL Final   Comment: RESULT CONFIRMED BY MANUAL DILUTION (NOTE) This assay is not validated for testing neonatal or myeloproliferative syndrome specimens for Vitamin B12 levels. Performed at [facility], [address], [place], [place] [address]    Folate 06/05/2022 13.2  >5.9 ng/mL Final   Performed at [facility], [address] [place], [place] [address]   Iron 06/05/2022 35  28 - 170 ug/dL Final   TIBC 95/63/8756 396  250 - 450 ug/dL Final   Saturation Ratios 06/05/2022 9 (L)  10.4 - 31.8 % Final   UIBC 06/05/2022 361  ug/dL Final   Performed at [facility], [address], [place], [place] [address]   Ferritin 06/05/2022 12  11 - 307 ng/mL Final   Performed at [facility], [address], [place], [place] [address]   Retic Ct Pct 06/05/2022 1.6  0.4 - 3.1 % Final   RBC. 06/05/2022 4.14  3.87 - 5.11 MIL/uL Final   Retic Count, Absolute 06/05/2022 65.0  19.0 - 186.0 K/uL Final   Immature Retic Fract 06/05/2022 13.6  2.3 - 15.9 % Final   Performed at [facility], [address] [place], [place] [address]  Admission on 05/25/2022, Discharged on 05/28/2022  Component Date Value Ref Range Status   WBC 05/25/2022 6.9  4.0 - 10.5 K/uL Final   RBC 05/25/2022 4.40  3.87 - 5.11 MIL/uL Final   Hemoglobin 05/25/2022 11.1 (L)  12.0 - 15.0 g/dL Final   HCT 30/16/0109 35.8 (L)  36.0 - 46.0 % Final   MCV 05/25/2022 81.4  80.0 - 100.0 fL Final   MCH 05/25/2022 25.2 (L)  26.0 - 34.0 pg Final   MCHC 05/25/2022 31.0  30.0 - 36.0 g/dL Final   RDW 32/35/5732 14.5  11.5 - 15.5 % Final   Platelets 05/25/2022 277  150 - 400 K/uL Final   nRBC 05/25/2022 0.0  0.0 - 0.2  % Final   Neutrophils Relative % 05/25/2022 59  % Final   Neutro Abs 05/25/2022 4.0  1.7 - 7.7 K/uL Final   Lymphocytes Relative 05/25/2022 33  % Final   Lymphs Abs 05/25/2022 2.3  0.7 - 4.0 K/uL Final   Monocytes Relative 05/25/2022 6  % Final   Monocytes  Absolute 05/25/2022 0.4  0.1 - 1.0 K/uL Final   Eosinophils Relative 05/25/2022 1  % Final   Eosinophils Absolute 05/25/2022 0.0  0.0 - 0.5 K/uL Final   Basophils Relative 05/25/2022 1  % Final   Basophils Absolute 05/25/2022 0.0  0.0 - 0.1 K/uL Final   Immature Granulocytes 05/25/2022 0  % Final   Abs Immature Granulocytes 05/25/2022 0.03  0.00 - 0.07 K/uL Final   Performed at [facility], [address], [place], [place] [address]   Sodium 05/25/2022 138  135 - 145 mmol/L Final   Potassium 05/25/2022 3.8  3.5 - 5.1 mmol/L Final   Chloride 05/25/2022 110  98 - 111 mmol/L Final   CO2 05/25/2022 19 (L)  22 - 32 mmol/L Final   Glucose, Bld 05/25/2022 104 (H)  70 - 99 mg/dL Final   Glucose reference range applies only to samples taken after fasting for at least 8 hours.   BUN 05/25/2022 6  6 - 20 mg/dL Final   Creatinine, Ser 05/25/2022 0.77  0.44 - 1.00 mg/dL Final   Calcium 30/86/5784 8.8 (L)  8.9 - 10.3 mg/dL Final   Total Protein 69/62/9528 6.7  6.5 - 8.1 g/dL Final   Albumin 41/32/4401 3.6  3.5 - 5.0 g/dL Final   AST 02/72/5366 18  15 - 41 U/L Final   ALT 05/25/2022 15  0 - 44 U/L Final   Alkaline Phosphatase 05/25/2022 72  38 - 126 U/L Final   Total Bilirubin 05/25/2022 0.3  0.3 - 1.2 mg/dL Final   GFR, Estimated 05/25/2022 >60  >60 mL/min Final   Comment: (NOTE) Calculated using the CKD-EPI Creatinine Equation (2021)    Anion gap 05/25/2022 9  5 - 15 Final   Performed at [facility], [address] [place], [place] [address]   Alcohol, Ethyl (B) 05/25/2022 <10  <10 mg/dL Final   Comment: (NOTE) Lowest detectable limit for serum alcohol is 10 mg/dL.  For medical purposes only. Performed at [facility], [address] [place], [place] [address]    Salicylate Lvl 05/25/2022 <7.0 (L)  7.0 - 30.0 mg/dL Final   Performed at [facility], [address], [place], [place] [address]   Acetaminophen (Tylenol), Serum 05/25/2022 <10 (L)  10 - 30 ug/mL Final   Comment: (NOTE) Therapeutic concentrations vary significantly. A range of 10-30 ug/mL  may be an effective concentration for many patients. However, some  are best treated at concentrations outside of this range. Acetaminophen concentrations >150 ug/mL at 4 hours after ingestion  and >50 ug/mL at 12 hours after ingestion are often associated with  toxic reactions.  Performed at [facility], [address] [place], [place] [address]    Opiates 05/25/2022 NONE DETECTED  NONE DETECTED Final   Cocaine 05/25/2022 NONE DETECTED  NONE DETECTED Final   Benzodiazepines 05/25/2022 NONE DETECTED  NONE DETECTED Final   Amphetamines 05/25/2022 NONE DETECTED  NONE DETECTED Final   Tetrahydrocannabinol 05/25/2022 NONE DETECTED  NONE DETECTED Final   Barbiturates 05/25/2022 NONE DETECTED  NONE DETECTED Final   Comment: (NOTE) DRUG SCREEN FOR MEDICAL PURPOSES ONLY.  IF CONFIRMATION IS NEEDED FOR ANY PURPOSE, NOTIFY LAB WITHIN 5 DAYS.  LOWEST DETECTABLE LIMITS FOR URINE DRUG SCREEN Drug Class                     Cutoff (ng/mL) Amphetamine and metabolites    1000 Barbiturate and metabolites  200 Benzodiazepine                 200 Opiates and metabolites        300 Cocaine and metabolites        300 THC                            50 Performed at [facility], [address] [place], [place] [address]    I-stat hCG, quantitative 05/25/2022 <5.0  <5 mIU/mL Final   Comment 3 05/25/2022          Final   Comment:   GEST. AGE      CONC.  (mIU/mL)   <=1 WEEK        5 - 50     2 WEEKS       50 - 500     3 WEEKS       100 - 10,000     4 WEEKS     1,000 - 30,000        FEMALE AND NON-PREGNANT FEMALE:     LESS THAN 5  mIU/mL    Acetaminophen (Tylenol), Serum 05/25/2022 <10 (L)  10 - 30 ug/mL Final   Comment: (NOTE) Therapeutic concentrations vary significantly. A range of 10-30 ug/mL  may be an effective concentration for many patients. However, some  are best treated at concentrations outside of this range. Acetaminophen concentrations >150 ug/mL at 4 hours after ingestion  and >50 ug/mL at 12 hours after ingestion are often associated with  toxic reactions.  Performed at [facility], [address] [place], [place] [address]    SARS Coronavirus 2 by RT PCR 05/27/2022 NEGATIVE  NEGATIVE Final   Influenza A by PCR 05/27/2022 NEGATIVE  NEGATIVE Final   Influenza B by PCR 05/27/2022 NEGATIVE  NEGATIVE Final   Comment: (NOTE) The Xpert Xpress SARS-CoV-2/FLU/RSV plus assay is intended as an aid in the diagnosis of influenza from Nasopharyngeal swab specimens and should not be used as a sole basis for treatment. Nasal washings and aspirates are unacceptable for Xpert Xpress SARS-CoV-2/FLU/RSV testing.  Fact Sheet for Patients: BloggerCourse.com  Fact Sheet for Healthcare Providers: SeriousBroker.it  This test is not yet approved or cleared by the [place] FDA and has been authorized for detection and/or diagnosis of SARS-CoV-2 by FDA under an Emergency Use Authorization (EUA). This EUA will remain in effect (meaning this test can be used) for the duration of the COVID-19 declaration under Section 564(b)(1) of the Act, 21 U.S.C. section 360bbb-3(b)(1), unless the authorization is terminated or revoked.     Resp Syncytial Virus by PCR 05/27/2022 NEGATIVE  NEGATIVE Final   Comment: (NOTE) Fact Sheet for Patients: BloggerCourse.com  Fact Sheet for Healthcare Providers: SeriousBroker.it  This test is not yet approved or cleared by the [place] FDA and has been authorized for  detection and/or diagnosis of SARS-CoV-2 by FDA under an Emergency Use Authorization (EUA). This EUA will remain in effect (meaning this test can be used) for the duration of the COVID-19 declaration under Section 564(b)(1) of the Act, 21 U.S.C. section 360bbb-3(b)(1), unless the authorization is terminated or revoked.  Performed at [facility], [address] [place], [place] [address]   Admission on 05/19/2022, Discharged on 05/21/2022  Component Date Value Ref Range Status   Sodium 05/19/2022 136  135 - 145 mmol/L Final   Potassium 05/19/2022 3.7  3.5 -  5.1 mmol/L Final   Chloride 05/19/2022 107  98 - 111 mmol/L Final   CO2 05/19/2022 22  22 - 32 mmol/L Final   Glucose, Bld 05/19/2022 99  70 - 99 mg/dL Final   Glucose reference range applies only to samples taken after fasting for at least 8 hours.   BUN 05/19/2022 9  6 - 20 mg/dL Final   Creatinine, Ser 05/19/2022 0.70  0.44 - 1.00 mg/dL Final   Calcium 16/11/9602 8.7 (L)  8.9 - 10.3 mg/dL Final   Total Protein 54/10/8117 7.3  6.5 - 8.1 g/dL Final   Albumin 14/78/2956 3.9  3.5 - 5.0 g/dL Final   AST 21/30/8657 21  15 - 41 U/L Final   ALT 05/19/2022 17  0 - 44 U/L Final   Alkaline Phosphatase 05/19/2022 72  38 - 126 U/L Final   Total Bilirubin 05/19/2022 0.4  0.3 - 1.2 mg/dL Final   GFR, Estimated 05/19/2022 >60  >60 mL/min Final   Comment: (NOTE) Calculated using the CKD-EPI Creatinine Equation (2021)    Anion gap 05/19/2022 7  5 - 15 Final   Performed at [facility], [address], [place], [place] [address]   Acetaminophen (Tylenol), Serum 05/19/2022 <10 (L)  10 - 30 ug/mL Final   Comment: (NOTE) Therapeutic concentrations vary significantly. A range of 10-30 ug/mL  may be an effective concentration for many patients. However, some  are best treated at concentrations outside of this range. Acetaminophen concentrations >150 ug/mL at 4 hours after ingestion  and >50 ug/mL at 12 hours after  ingestion are often associated with  toxic reactions.  Performed at [facility], [address], [place], [place] [address]    Salicylate Lvl 05/19/2022 <7.0 (L)  7.0 - 30.0 mg/dL Final   Performed at [facility], [address], [place], [place] [address]   WBC 05/19/2022 10.1  4.0 - 10.5 K/uL Final   RBC 05/19/2022 5.13 (H)  3.87 - 5.11 MIL/uL Final   Hemoglobin 05/19/2022 12.8  12.0 - 15.0 g/dL Final   HCT 40/11/2723 41.6  36.0 - 46.0 % Final   MCV 05/19/2022 81.1  80.0 - 100.0 fL Final   MCH 05/19/2022 25.0 (L)  26.0 - 34.0 pg Final   MCHC 05/19/2022 30.8  30.0 - 36.0 g/dL Final   RDW 36/64/4034 14.6  11.5 - 15.5 % Final   Platelets 05/19/2022 328  150 - 400 K/uL Final   nRBC 05/19/2022 0.0  0.0 - 0.2 % Final   Neutrophils Relative % 05/19/2022 66  % Final   Neutro Abs 05/19/2022 6.6  1.7 - 7.7 K/uL Final   Lymphocytes Relative 05/19/2022 28  % Final   Lymphs Abs 05/19/2022 2.8  0.7 - 4.0 K/uL Final   Monocytes Relative 05/19/2022 5  % Final   Monocytes Absolute 05/19/2022 0.5  0.1 - 1.0 K/uL Final   Eosinophils Relative 05/19/2022 1  % Final   Eosinophils Absolute 05/19/2022 0.1  0.0 - 0.5 K/uL Final   Basophils Relative 05/19/2022 0  % Final   Basophils Absolute 05/19/2022 0.0  0.0 - 0.1 K/uL Final   Immature Granulocytes 05/19/2022 0  % Final   Abs Immature Granulocytes 05/19/2022 0.02  0.00 - 0.07 K/uL Final   Performed at [facility], [address], [place], [place] [address]   Magnesium 05/19/2022 1.9  1.7 - 2.4 mg/dL Final   Performed at [facility], [address] [place], [place] [address]   Alcohol, Ethyl (B) 05/19/2022 <10  <10 mg/dL Final   Comment: (NOTE) Lowest detectable limit for serum alcohol is 10 mg/dL.  For medical purposes only. Performed at [facility], [address] [place], [place] [address]    I-stat hCG, quantitative 05/19/2022 <5.0  <5 mIU/mL  Final   Comment 3 05/19/2022          Final   Comment:   GEST. AGE      CONC.  (mIU/mL)   <=1 WEEK        5 - 50     2 WEEKS       50 - 500     3 WEEKS       100 - 10,000     4 WEEKS     1,000 - 30,000        FEMALE AND NON-PREGNANT FEMALE:     LESS THAN 5 mIU/mL    Opiates 05/19/2022 NONE DETECTED  NONE DETECTED Final   Cocaine 05/19/2022 NONE DETECTED  NONE DETECTED Final   Benzodiazepines 05/19/2022 NONE DETECTED  NONE DETECTED Final   Amphetamines 05/19/2022 NONE DETECTED  NONE DETECTED Final   Tetrahydrocannabinol 05/19/2022 NONE DETECTED  NONE DETECTED Final   Barbiturates 05/19/2022 NONE DETECTED  NONE DETECTED Final   Comment: (NOTE) DRUG SCREEN FOR MEDICAL PURPOSES ONLY.  IF CONFIRMATION IS NEEDED FOR ANY PURPOSE, NOTIFY LAB WITHIN 5 DAYS.  LOWEST DETECTABLE LIMITS FOR URINE DRUG SCREEN Drug Class                     Cutoff (ng/mL) Amphetamine and metabolites    1000 Barbiturate and metabolites    200 Benzodiazepine                 200 Opiates and metabolites        300 Cocaine and metabolites        300 THC                            50 Performed at [facility], [address], [place], [place] [address]    Acetaminophen (Tylenol), Serum 05/19/2022 <10 (L)  10 - 30 ug/mL Final   Comment: (NOTE) Therapeutic concentrations vary significantly. A range of 10-30 ug/mL  may be an effective concentration for many patients. However, some  are best treated at concentrations outside of this range. Acetaminophen concentrations >150 ug/mL at 4 hours after ingestion  and >50 ug/mL at 12 hours after ingestion are often associated with  toxic reactions.  Performed at [facility], [address] [place], [place] [address]    Sodium 05/19/2022 134 (L)  135 - 145 mmol/L Final   Potassium 05/19/2022 3.3 (L)  3.5 - 5.1 mmol/L Final   Chloride 05/19/2022 107  98 - 111 mmol/L Final   CO2 05/19/2022 23  22 - 32 mmol/L Final   Glucose, Bld  05/19/2022 103 (H)  70 - 99 mg/dL Final   Glucose reference range applies only to samples taken after fasting for at least 8 hours.   BUN 05/19/2022 8  6 - 20 mg/dL Final   Creatinine, Ser 05/19/2022 0.79  0.44 - 1.00 mg/dL Final   Calcium 12/02/2534 8.3 (L)  8.9 - 10.3 mg/dL Final   GFR, Estimated 05/19/2022 >60  >60 mL/min Final   Comment: (NOTE) Calculated using the CKD-EPI Creatinine Equation (2021)  Anion gap 05/19/2022 4 (L)  5 - 15 Final   Performed at [facility], [address], [place], [place] [address]   Magnesium 05/19/2022 2.3  1.7 - 2.4 mg/dL Final   Performed at [facility], [address] [place], [place] [address]   SARS Coronavirus 2 by RT PCR 05/21/2022 NEGATIVE  NEGATIVE Final   Comment: (NOTE) SARS-CoV-2 target nucleic acids are NOT DETECTED.  The SARS-CoV-2 RNA is generally detectable in upper and lower respiratory specimens during the acute phase of infection. The lowest concentration of SARS-CoV-2 viral copies this assay can detect is 250 copies / mL. A negative result does not preclude SARS-CoV-2 infection and should not be used as the sole basis for treatment or other patient management decisions.  A negative result may occur with improper specimen collection / handling, submission of specimen other than nasopharyngeal swab, presence of viral mutation(s) within the areas targeted by this assay, and inadequate number of viral copies (<250 copies / mL). A negative result must be combined with clinical observations, patient history, and epidemiological information.  Fact Sheet for Patients:   RoadLapTop.co.za  Fact Sheet for Healthcare Providers: http://kim-miller.com/  This test is not yet approved or                           cleared by the [place] FDA and has been authorized for detection and/or diagnosis of SARS-CoV-2 by FDA under an Emergency Use  Authorization (EUA).  This EUA will remain in effect (meaning this test can be used) for the duration of the COVID-19 declaration under Section 564(b)(1) of the Act, 21 U.S.C. section 360bbb-3(b)(1), unless the authorization is terminated or revoked sooner.  Performed at [facility], [address] [place], [place] [address]   Admission on 05/15/2022, Discharged on 05/16/2022  Component Date Value Ref Range Status   SARS Coronavirus 2 by RT PCR 05/15/2022 NEGATIVE  NEGATIVE Final   Performed at [facility], [address] [place], [place] [address]   SARSCOV2ONAVIRUS 2 AG 05/16/2022 NEGATIVE  NEGATIVE Final   Comment: (NOTE) SARS-CoV-2 antigen NOT DETECTED.   Negative results are presumptive.  Negative results do not preclude SARS-CoV-2 infection and should not be used as the sole basis for treatment or other patient management decisions, including infection  control decisions, particularly in the presence of clinical signs and  symptoms consistent with COVID-19, or in those who have been in contact with the virus.  Negative results must be combined with clinical observations, patient history, and epidemiological information. The expected result is Negative.  Fact Sheet for Patients: https://www.jennings-kim.com/  Fact Sheet for Healthcare Providers: https://alexander-rogers.biz/  This test is not yet approved or cleared by the [place] FDA and  has been authorized for detection and/or diagnosis of SARS-CoV-2 by FDA under an Emergency Use Authorization (EUA).  This EUA will remain in effect (meaning this test can be used) for the duration of  the COV                          ID-19 declaration under Section 564(b)(1) of the Act, 21 U.S.C. section 360bbb-3(b)(1), unless the authorization is terminated or revoked sooner.    Admission on 05/13/2022, Discharged on 05/13/2022  Component Date Value Ref Range Status   Sodium  05/13/2022 139  135 - 145 mmol/L Final   Potassium 05/13/2022 3.6  3.5 -  5.1 mmol/L Final   Chloride 05/13/2022 107  98 - 111 mmol/L Final   CO2 05/13/2022 24  22 - 32 mmol/L Final   Glucose, Bld 05/13/2022 98  70 - 99 mg/dL Final   Glucose reference range applies only to samples taken after fasting for at least 8 hours.   BUN 05/13/2022 6  6 - 20 mg/dL Final   Creatinine, Ser 05/13/2022 0.75  0.44 - 1.00 mg/dL Final   Calcium 16/11/9602 8.6 (L)  8.9 - 10.3 mg/dL Final   GFR, Estimated 05/13/2022 >60  >60 mL/min Final   Comment: (NOTE) Calculated using the CKD-EPI Creatinine Equation (2021)    Anion gap 05/13/2022 8  5 - 15 Final   Performed at [facility], [address], [place], [place] [address]   WBC 05/13/2022 7.6  4.0 - 10.5 K/uL Final   RBC 05/13/2022 4.38  3.87 - 5.11 MIL/uL Final   Hemoglobin 05/13/2022 10.9 (L)  12.0 - 15.0 g/dL Final   HCT 11/91/4782 35.4 (L)  36.0 - 46.0 % Final   MCV 05/13/2022 80.8  80.0 - 100.0 fL Final   MCH 05/13/2022 24.9 (L)  26.0 - 34.0 pg Final   MCHC 05/13/2022 30.8  30.0 - 36.0 g/dL Final   RDW 95/62/1308 14.6  11.5 - 15.5 % Final   Platelets 05/13/2022 248  150 - 400 K/uL Final   nRBC 05/13/2022 0.0  0.0 - 0.2 % Final   Neutrophils Relative % 05/13/2022 60  % Final   Neutro Abs 05/13/2022 4.5  1.7 - 7.7 K/uL Final   Lymphocytes Relative 05/13/2022 32  % Final   Lymphs Abs 05/13/2022 2.4  0.7 - 4.0 K/uL Final   Monocytes Relative 05/13/2022 7  % Final   Monocytes Absolute 05/13/2022 0.5  0.1 - 1.0 K/uL Final   Eosinophils Relative 05/13/2022 1  % Final   Eosinophils Absolute 05/13/2022 0.1  0.0 - 0.5 K/uL Final   Basophils Relative 05/13/2022 0  % Final   Basophils Absolute 05/13/2022 0.0  0.0 - 0.1 K/uL Final   Immature Granulocytes 05/13/2022 0  % Final   Abs Immature Granulocytes 05/13/2022 0.03  0.00 - 0.07 K/uL Final   Performed at [facility], [address], [place], [place] [address]  Admission on 05/13/2022,  Discharged on 05/13/2022  Component Date Value Ref Range Status   WBC 05/13/2022 10.1  4.0 - 10.5 K/uL Final   RBC 05/13/2022 4.70  3.87 - 5.11 MIL/uL Final   Hemoglobin 05/13/2022 11.9 (L)  12.0 - 15.0 g/dL Final   HCT 69/62/9528 38.4  36.0 - 46.0 % Final   MCV 05/13/2022 81.7  80.0 - 100.0 fL Final   MCH 05/13/2022 25.3 (L)  26.0 - 34.0 pg Final   MCHC 05/13/2022 31.0  30.0 - 36.0 g/dL Final   RDW 41/32/4401 14.8  11.5 - 15.5 % Final   Platelets 05/13/2022 266  150 - 400 K/uL Final   nRBC 05/13/2022 0.0  0.0 - 0.2 % Final   Performed at [facility], [address], [place], [place] [address]   Sodium 05/13/2022 140  135 - 145 mmol/L Final   Potassium 05/13/2022 3.5  3.5 - 5.1 mmol/L Final   Chloride 05/13/2022 104  98 - 111 mmol/L Final   CO2 05/13/2022 24  22 - 32 mmol/L Final   Glucose, Bld 05/13/2022 122 (H)  70 - 99 mg/dL Final   Glucose reference range applies only to  samples taken after fasting for at least 8 hours.   BUN 05/13/2022 5 (L)  6 - 20 mg/dL Final   Creatinine, Ser 05/13/2022 0.85  0.44 - 1.00 mg/dL Final   Calcium 16/11/9602 9.0  8.9 - 10.3 mg/dL Final   Total Protein 54/10/8117 6.8  6.5 - 8.1 g/dL Final   Albumin 14/78/2956 3.6  3.5 - 5.0 g/dL Final   AST 21/30/8657 24  15 - 41 U/L Final   ALT 05/13/2022 19  0 - 44 U/L Final   Alkaline Phosphatase 05/13/2022 78  38 - 126 U/L Final   Total Bilirubin 05/13/2022 0.5  0.3 - 1.2 mg/dL Final   GFR, Estimated 05/13/2022 >60  >60 mL/min Final   Comment: (NOTE) Calculated using the CKD-EPI Creatinine Equation (2021)    Anion gap 05/13/2022 12  5 - 15 Final   Performed at [facility], [address], [place], [place] [address]   TSH 05/13/2022 1.353  0.350 - 4.500 uIU/mL Final   Comment: Performed by a 3rd Generation assay with a functional sensitivity of <=0.01 uIU/mL. Performed at [facility], [address] [place], [place] [address]    D-Dimer, Quant 05/13/2022 <0.27  0.00 - 0.50 ug/mL-FEU  Final   Comment: (NOTE) At the manufacturer cut-off value of 0.5 g/mL FEU, this assay has a negative predictive value of 95-100%.This assay is intended for use in conjunction with a clinical pretest probability (PTP) assessment model to exclude pulmonary embolism (PE) and deep venous thrombosis (DVT) in outpatients suspected of PE or DVT. Results should be correlated with clinical presentation. Performed at [facility], [address] [place], [place] [address]    I-stat hCG, quantitative 05/13/2022 <5.0  <5 mIU/mL Final   Comment 3 05/13/2022          Final   Comment:   GEST. AGE      CONC.  (mIU/mL)   <=1 WEEK        5 - 50     2 WEEKS       50 - 500     3 WEEKS       100 - 10,000     4 WEEKS     1,000 - 30,000        FEMALE AND NON-PREGNANT FEMALE:     LESS THAN 5 mIU/mL   Admission on 05/09/2022, Discharged on 05/10/2022  Component Date Value Ref Range Status   Sodium 05/10/2022 137  135 - 145 mmol/L Final   Potassium 05/10/2022 3.9  3.5 - 5.1 mmol/L Final   Chloride 05/10/2022 103  98 - 111 mmol/L Final   CO2 05/10/2022 20 (L)  22 - 32 mmol/L Final   Glucose, Bld 05/10/2022 119 (H)  70 - 99 mg/dL Final   Glucose reference range applies only to samples taken after fasting for at least 8 hours.   BUN 05/10/2022 7  6 - 20 mg/dL Final   Creatinine, Ser 05/10/2022 0.67  0.44 - 1.00 mg/dL Final   Calcium 40/11/2723 9.0  8.9 - 10.3 mg/dL Final   GFR, Estimated 05/10/2022 >60  >60 mL/min Final   Comment: (NOTE) Calculated using the CKD-EPI Creatinine Equation (2021)    Anion gap 05/10/2022 14  5 - 15 Final   Performed at [facility], [address] [place], [place] [address]   WBC 05/10/2022 9.1  4.0 - 10.5 K/uL Final   RBC 05/10/2022 4.52  3.87 - 5.11 MIL/uL  Final   Hemoglobin 05/10/2022 11.5 (L)  12.0 - 15.0 g/dL Final   HCT 16/11/9602 37.2  36.0 - 46.0 % Final   MCV 05/10/2022 82.3  80.0 - 100.0 fL Final   MCH 05/10/2022 25.4 (L)  26.0 - 34.0 pg Final    MCHC 05/10/2022 30.9  30.0 - 36.0 g/dL Final   RDW 54/10/8117 14.5  11.5 - 15.5 % Final   Platelets 05/10/2022 283  150 - 400 K/uL Final   nRBC 05/10/2022 0.0  0.0 - 0.2 % Final   Performed at [facility], [address] [place], [place] [address]   Troponin I (High Sensitivity) 05/10/2022 <2  <18 ng/L Final   Comment: (NOTE) Elevated high sensitivity troponin I (hsTnI) values and significant  changes across serial measurements may suggest ACS but many other  chronic and acute conditions are known to elevate hsTnI results.  Refer to the "Links" section for chest pain algorithms and additional  guidance. Performed at [facility], [address], [place], [place] [address]    Troponin I (High Sensitivity) 05/10/2022 2  <18 ng/L Final   Comment: (NOTE) Elevated high sensitivity troponin I (hsTnI) values and significant  changes across serial measurements may suggest ACS but many other  chronic and acute conditions are known to elevate hsTnI results.  Refer to the "Links" section for chest pain algorithms and additional  guidance. Performed at [facility], [address] [place], [place] [address]   There may be more visits with results that are not included.    Blood Alcohol level:  Lab Results  Component Value Date   ETH <10 06/22/2022   ETH <10 06/16/2022    Metabolic Disorder Labs: Lab Results  Component Value Date   HGBA1C 5.2 04/20/2022   MPG 103 04/20/2022   MPG 96.8 10/07/2021   Lab Results  Component Value Date   PROLACTIN 66.2 (H) 03/02/2021   PROLACTIN 66.1 (H) 02/04/2020   Lab Results  Component Value Date   CHOL 191 04/20/2022   TRIG 163 (H) 04/20/2022   HDL 52 04/20/2022   CHOLHDL 3.7 04/20/2022   VLDL 33 04/20/2022   LDLCALC 106 (H) 04/20/2022   LDLCALC 102 (H) 12/10/2021    Therapeutic Lab Levels: No results found for: "LITHIUM" Lab Results  Component Value Date   VALPROATE 48 (L) 07/10/2015   VALPROATE 84 05/31/2015    No results found for: "CBMZ"  Physical Findings   AIMS    Flowsheet Row [facility]) [facility] Ad[facility] (Discharged) [facility] Admission (Discharged[facility] Admission (Discharged) [facility] Admission (Discharged) [facility]  AIMS Total Score 0 0 0 0 0      AUDIT    Flowsheet Row Ad[facility]) [facility] Admission (Discharged) [facility] Admission (Discharged) [facility] Admission (Discharged) [facility] Ad[facility] ([facility] INPATIENT ADULT 300B  Alcohol Use Disorder Identification Test Final Score (AUDIT) 0 0 0 1 0      GAD-7    Flowsheet Row Office Visit from 08/20/2016 in [facility] for [facility]  from 04/03/2016 in Center for [facility] Routine Prenatal from 03/14/2016 in Center for [facility] Routine Prenatal from 03/08/2016 in [facility] for [facility] Routine Prenatal from 03/01/2016 in Center for [facility]  Total GAD-7 Score 1 19 13 9 5       [id number]    Flowsheet Row ED from 03/10/2022 in [facility] ED from 02/10/2022 in [facility] ED from 06/26/2021 in [facility] ED from 05/01/2021 in [facility] ED from 02/03/2020 in [facility]  PHQ-2 Total Score 2 4 3 5 6   PHQ-9 Total Score 9  16 9 17 27       Flowsheet Row ED from 07/06/2022 in [facility] ED from 06/28/2022 in [facility] from 06/22/2022 in [facility]  C-SSRS RISK CATEGORY Moderate Risk Error: Q3, 4, or 5 should not be populated when Q2 is No High Risk        Musculoskeletal  Strength & Muscle Tone: within normal limits Gait & Station: normal Patient leans: N/A  Psychiatric Specialty Exam  Presentation  General Appearance:  Appropriate for Environment  Eye Contact: Fair  Speech: Clear and Coherent  Speech Volume: Normal  Handedness: Right   Mood and Affect  Mood: Depressed  Affect: Congruent   Thought Process  Thought Processes: Coherent  Descriptions of Associations:Intact  Orientation:Full (Time, Place and Person)  Thought Content:Logical  Diagnosis of Schizophrenia or Schizoaffective disorder in past: Yes  Duration of Psychotic Symptoms: Greater than six months   Hallucinations:Hallucinations: None  Ideas of Reference:None  Suicidal Thoughts:Suicidal Thoughts: Yes, Active SI Active Intent and/or Plan: With Plan; With Means to Carry Out; With Intent  Homicidal Thoughts:Homicidal Thoughts: No   Sensorium  Memory: Immediate Fair; Recent Fair; Remote Fair  Judgment: Poor  Insight: Poor   Executive Functions  Concentration: Fair  Attention Span: Fair  Recall: [employer] of Knowledge: Fair  Language: Fair   Psychomotor Activity  Psychomotor Activity: Psychomotor Activity: Normal   Assets  Assets: Manufacturing systems engineer; Desire for Improvement; Housing; Leisure Time; Social Support   Sleep  Sleep: Sleep: Fair Number of Hours of Sleep: 7   Nutritional Assessment (For OBS and FBC admissions only) Has the patient had a weight loss or gain of 10 pounds or more in the last 3 months?: No Has the patient had a decrease in food intake/or  appetite?: No Does the patient have dental problems?: No Does the patient have eating habits or behaviors that may be indicators of an eating disorder including binging or inducing vomiting?: No Has the patient recently lost weight without trying?: 0 Has the patient been eating poorly because of a decreased appetite?: 0 Malnutrition Screening Tool Score: 0    Physical Exam  Physical Exam HENT:     Head: Normocephalic.     Nose: Nose normal.  Eyes:     Conjunctiva/sclera: Conjunctivae normal.  Cardiovascular:     Rate and Rhythm: Normal rate.  Pulmonary:     Effort: Pulmonary effort is normal.  Musculoskeletal:        General: Normal range of motion.     Cervical back: Normal range of motion.  Neurological:     Mental Status: She is alert and oriented to person, place, and time.    Review of Systems  Constitutional: Negative.   HENT:  Negative.    Eyes: Negative.   Respiratory: Negative.    Cardiovascular: Negative.   Gastrointestinal: Negative.   Genitourinary: Negative.   Musculoskeletal: Negative.   Neurological: Negative.   Endo/Heme/Allergies: Negative.    Blood pressure 107/68, pulse 93, temperature 98.3 F (36.8 C), temperature source Oral, resp. rate 18, SpO2 99 %. There is no height or weight on file to calculate BMI.  Scheduled medication regimen:   cyclobenzaprine  10 mg Oral QHS   escitalopram  20 mg Oral Daily   gabapentin  600 mg Oral TID   melatonin  3 mg Oral QHS   mometasone-formoterol  2 puff Inhalation BID   OLANZapine zydis  10 mg Oral QHS   pantoprazole  40 mg Oral Daily   prazosin  1 mg Oral QHS   PRN medication regimen:  albuterol, alum & mag hydroxide-simeth, hydrOXYzine, ibuprofen, [DISCONTINUED] OLANZapine zydis **AND** LORazepam, magnesium hydroxide  Disposition: Inpatient psychiatric treatment. Patient is voluntary. Patient accepted to [facility] today.    Layla Barter, NP 07/06/2022 9:51 AM

## 2022-07-06 NOTE — Progress Notes (Signed)
   07/06/22 0124  [facility]UC Triage Screening (Walk-ins at [facility] only)  How Did You Hear About [name]? Legal System  What Is the Reason for Your Visit/Call Today? Pt presents to [facility] voluntarily, accompanied by GPD due to SI with a plan to overdose on medications or cut herself. Pt reports that she received news today that she was denied social security and that will affect her ability to maintain her home. Pt has history of borderline personality disorder, schizoaffective disorder, bipolar type, PTSD, GAD, self-injurious behaviors, and past suicide attempts. Patient recently hospitalized at [facility] from 06/18/22-06/21/2022. Pt currently denies HI, AVH and substance/alcohol use.  How Long Has This Been Causing You Problems? <Week  Have You Recently Had Any Thoughts About Hurting Yourself? Yes  How long ago did you have thoughts about hurting yourself? currently  Are You Planning to Commit Suicide/Harm Yourself At This time? Yes  Have you Recently Had Thoughts About Hurting Someone [name]? No  How long ago did you have thoughts of harming others? pt denies  Are You Planning To Harm Someone At This Time? No  Are you currently experiencing any auditory, visual or other hallucinations? No  Have You Used Any Alcohol or Drugs in the Past 24 Hours? No  Do you have any current medical co-morbidities that require immediate attention? No  Clinician description of patient physical appearance/behavior: pt is calm, cooperative.  What Do You Feel Would Help You the Most Today? Treatment for Depression or other mood problem;Social Support;Stress Management  If access to [facility] was not available, would you have sought care in the Emergency Department? Yes  Determination of Need Urgent (48 hours)  Options For Referral Other: Comment;BH Urgent Care;Outpatient Therapy;Medication Management

## 2022-07-06 NOTE — Progress Notes (Signed)
At 8:10 pm patient came out of group and complained of slight chest pain, denies difficulties breathing, vital sign is BP 138/83, pulse 96, respirations 20 oxygen saturation 98 % on room air. patient also reports cardiac history, rated anxiety at 4/10 and depression a 10/10. Patient also requested to get her third dose of Gabapentin 600 mg tonight, she said she normally gets it three times daily and has only gotten it twice so far. Provider on call notified, new orders given and implemented. Patient returned to group at this time.

## 2022-07-06 NOTE — ED Notes (Signed)
Safe transport called 

## 2022-07-06 NOTE — ED Notes (Signed)
Rn notified Rn vanessa at [facility] that the patients potassium was 3.2.

## 2022-07-06 NOTE — ED Notes (Signed)
Pt A&O x 4, presents with suicidal ideations, plan to cut wrist or overdose on medications.  Pt reports denied for SSI yesterday.  Pt calm & cooperative, no distress noted.  Monitoring for safety.
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
## 2022-07-06 NOTE — ED Notes (Signed)
Patient  resting in no acute stress. RR even and unlabored .Environment secured .Will continue to monitor for safely. 

## 2022-07-06 NOTE — Progress Notes (Signed)
Patient ambulating on hall way denies pain and discomfort at this time, denies SI/HI/AVH at this time , patient contracts for safety, 1:1 staff present.   07/06/22 2000  Psych Admission Type (Psych Patients Only)  Admission Status Voluntary  Psychosocial Assessment  Patient Complaints Anxiety;Depression  Eye Contact Fair  Facial Expression Animated  Affect Preoccupied  Speech Loud  Interaction Assertive  Motor Activity Slow  Appearance/Hygiene Unremarkable  Behavior Characteristics Appropriate to situation;Cooperative  Mood Depressed  Thought Process  Coherency WDL  Content Preoccupation  Delusions None reported or observed  Perception WDL  Hallucination None reported or observed  Judgment Poor  Confusion None  Danger to Self  Current suicidal ideation? Denies  Description of Suicide Plan pt said no commends  Self-Injurious Behavior No self-injurious ideation or behavior indicators observed or expressed   Agreement Not to Harm Self Yes  Description of Agreement pt verbally contracts for safety  Danger to Others  Danger to Others None reported or observed  Danger to Others Abnormal  Harmful Behavior to others No threats or harm toward other people

## 2022-07-06 NOTE — Discharge Instructions (Signed)
Transfer to [facility] 

## 2022-07-06 NOTE — ED Notes (Signed)
Patient A&O x 4, ambulatory. Patient discharged in no acute distress. Patient endorse  SI/ denies HI, A/VH upon discharge. Pt belongings returned to patient from locker #   5intact. Patient escorted to lobby via staff for transport to  [facility] . Safety maintained.

## 2022-07-06 NOTE — ED Notes (Signed)
Provider states that patient can take the ibuprofen even thought the warning came up about lexapro.

## 2022-07-06 NOTE — Plan of Care (Signed)
This patient is well-known to me from previous psychiatric admissions.  She reported to nursing staff that she has suicidal thoughts and she is unable to contract for safety.  She has a serious history of self-harm.  Initiating one-to-one monitoring at this time, can be reassessed in the morning.  Carlyn Reichert, MD PGY-2

## 2022-07-06 NOTE — Progress Notes (Signed)
Patient verbalized relief, stated " l feel better after getting my third dose of Gabapentin"  vital signs at this time is BP 130/82, pulse 80, respiration 20, 02 sat at  99 % RA.  No further needs verbalized by patient at this time 1:1 staff  present. Will continue to monitor and follow plan of care as ordered.

## 2022-07-06 NOTE — ED Notes (Addendum)
Patients potassium lab just came back but patient has left going to [facility].Notified provider that potassium was 3.2.There were no notes stating hold pending labs.

## 2022-07-06 NOTE — ED Notes (Signed)
Rn called report to [facility] spoke with [place] rn

## 2022-07-06 NOTE — Progress Notes (Addendum)
[facility] Initial 1:1 Note  1:1 order placed by MD at 1632. Patient is making suicidal statements and is unable to contract for safety. 1:1 sitter in place at this time.
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
## 2022-07-07 DIAGNOSIS — F251 Schizoaffective disorder, depressive type: Principal | ICD-10-CM

## 2022-07-07 MED ORDER — BENZTROPINE MESYLATE 1 MG PO TABS
1.0000 mg | ORAL_TABLET | Freq: Every day | ORAL | Status: DC
  Administered 2022-07-07 – 2022-07-08 (×2): 1 mg via ORAL
  Filled 2022-07-07 (×4): qty 1

## 2022-07-07 NOTE — Plan of Care (Signed)
  Problem: Coping: Goal: Ability to verbalize frustrations and anger appropriately will improve Outcome: Progressing Goal: Ability to demonstrate self-control will improve Outcome: Progressing   Problem: Health Behavior/Discharge Planning: Goal: Identification of resources available to assist in meeting health care needs will improve Outcome: Progressing Goal: Compliance with treatment plan for underlying cause of condition will improve Outcome: Progressing   Problem: Physical Regulation: Goal: Ability to maintain clinical measurements within normal limits will improve Outcome: Progressing   Problem: Safety: Goal: Periods of time without injury will increase Outcome: Progressing   Problem: Education: Goal: Utilization of techniques to improve thought processes will improve Outcome: Progressing Goal: Knowledge of the prescribed therapeutic regimen will improve Outcome: Progressing   Problem: Activity: Goal: Interest or engagement in leisure activities will improve Outcome: Progressing Goal: Imbalance in normal sleep/wake cycle will improve Outcome: Progressing   Problem: Coping: Goal: Coping ability will improve Outcome: Progressing Goal: Will verbalize feelings Outcome: Progressing

## 2022-07-07 NOTE — [facility] Group Notes (Signed)
[facility] Group Notes:  (Nursing/MHT/Case Management/Adjunct)  Date:  07/07/2022  Time:  2000  Type of Therapy:   wrap up group  Participation Level:  Active  Participation Quality:  Appropriate, Attentive, Sharing, and Supportive  Affect:  Anxious  Cognitive:  Alert  Insight:  Limited  Engagement in Group:  Engaged  Modes of Intervention:  Clarification, Education, and Socialization  Summary of Progress/Problems: Positive thinking and self-care were discussed.   Marcille Buffy 07/07/2022, 9:17 PM

## 2022-07-07 NOTE — Progress Notes (Signed)
Nursing 1:1 note:  Pt is her room, lying on her bed reading her Bible quietly. Remains on 1:1 for safety. Verbalizes concern regarding Social Security benefits, they declined my application.

## 2022-07-07 NOTE — Progress Notes (Signed)
Nursing 1:1 Note:  Pt sleeping, respirations are even and non labored. Remains on 1:1 for safety.

## 2022-07-07 NOTE — Progress Notes (Signed)
   07/07/22 1030  Psych Admission Type (Psych Patients Only)  Admission Status Voluntary  Psychosocial Assessment  Eye Contact Fair  Facial Expression Flat  Affect Depressed;Preoccupied  Speech Logical/coherent (Does not speak much.)  Interaction Assertive  Motor Activity Fidgety  Appearance/Hygiene Unremarkable  Behavior Characteristics Appropriate to situation;Cooperative  Mood Depressed;Anxious  Thought Process  Coherency WDL  Content Preoccupation (With stressors.)  Delusions None reported or observed  Perception WDL  Hallucination None reported or observed  Judgment Poor  Confusion None  Danger to Self  Current suicidal ideation? Passive  Description of Suicide Plan Thoughts to self harm if 1:1 wasn't present. Feels safe with w 1:1.  Self-Injurious Behavior Some self-injurious ideation observed or expressed.  No lethal plan expressed   Agreement Not to Harm Self Yes  Description of Agreement Verbally contracts with support of 1:1.  Danger to Others  Danger to Others None reported or observed  Danger to Others Abnormal  Harmful Behavior to others No threats or harm toward other people
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
## 2022-07-07 NOTE — Progress Notes (Signed)
   07/07/22 2020  Psych Admission Type (Psych Patients Only)  Admission Status Voluntary  Psychosocial Assessment  Patient Complaints Anxiety;Depression;Helplessness  Eye Contact Fair  Facial Expression Animated  Affect Preoccupied  Speech Loud  Interaction Assertive  Motor Activity Slow  Appearance/Hygiene Unremarkable  Behavior Characteristics Cooperative;Appropriate to situation  Thought Process  Coherency WDL  Content Preoccupation  Delusions None reported or observed  Perception WDL  Hallucination None reported or observed  Judgment Poor  Confusion None  Danger to Self  Current suicidal ideation? Denies  Self-Injurious Behavior No self-injurious ideation or behavior indicators observed or expressed   Agreement Not to Harm Self Yes  Description of Agreement pt verbally contracts for safety  Danger to Others  Danger to Others None reported or observed  Danger to Others Abnormal  Harmful Behavior to others No threats or harm toward other people

## 2022-07-07 NOTE — Progress Notes (Signed)
Nursing 1:1 note:  Pt in bed, fell asleep reading bible. Respirations are even and non labored. Remains on 1:1 for safety. Pt has been quiet and cooperative this shift, returns to her room after med administration, not social with peers in milieu. Pt feels more comfortable with 1:1 as she has thoughts of self harm and is unable to verbally contract for safety.

## 2022-07-07 NOTE — Group Note (Signed)
LCSW Group Therapy Note  07/07/2022   10:30-11:30am   Topic:  Anger and its Underlying Emotions  Participation Level:  Did Not Attend  Description of Group:   In this group, patients identified the last situation that provoked them to anger.   We then talked about the Anger Iceberg and the way many underlying feelings lead to feelings of anger.  Focus was placed on how helpful it is to recognize the underlying emotions to anger in order to address these for more permanent resolution.  Because the group was supposed to be on social wellness, CSW continually made the connection between our ability to recognize our feelings and our ability to convey our emotional needs to those around us in a more helpful manner.    Therapeutic Goals: Patients will share emotions that commonly incite their anger and how they typically respond Patients will identify how their coping skills work for them and/or against them Patients will explore possible alternative thoughts to their automatic ones Patients will learn that anger itself is normal and that healthier reactions can assist with resolving conflict rather than worsening situations  Summary of Patient Progress:  Patient was invited to group, did not attend.   Therapeutic Modalities:   Cognitive Behavioral Therapy Processing  Sandeep Radell J Grossman-Orr, MSW, LCSW  

## 2022-07-07 NOTE — H&P (Signed)
Psychiatric Admission Assessment Adult  Patient Identification: [name] MRN:  [id number] Date of Evaluation:  07/07/2022 Chief Complaint:  Schizoaffective disorder, depressive type (HCC) [F25.1] Principal Diagnosis: Schizoaffective disorder, depressive type (HCC) Diagnosis:  Principal Problem:   Schizoaffective disorder, depressive type (HCC) Active Problems:   Borderline personality disorder (HCC)   Asperger syndrome   Suicidal ideation   PTSD (post-traumatic stress disorder)   Fibromyalgia  Total Time spent with patient: 1 hour  History of Present Illness: [name]. Ciresi is a 32 yo female with past psychiatric history of PTSD, borderline personality d/o & schizoaffective d/o who was admitted voluntarily for SI with a plan to slice her throat in the setting of SSI not being accepted for recertification.    On Chart Review: Patient is very well-known to the behavioral health service line.  She has had numerous inpatient hospitalizations.   On Evaluation Today: Patient reports that she was admitted due to having SI with a plan to slit her throat in the context of soon losing her SSI.  Patient reports that this has been particularly stressful for her, as she has tried to hold a job in hopes of working toward regaining custody of her daughter, but she has been unable to continue working beyond 1 to 2 weeks due to fibromyalgia and mental health.  Additionally, her dad has been a stressor, as he told her that he does not want anything to do with her; as well, uncle is a Landscape architect."  Patient reports that her CST is the only support that she has in her life.  She is still living with her ex boyfriend, who prompted multiple previous admissions due to stress over evicting him.  She denies that he is a stressor, stating that he will be moving out at the end of the year.  Over the past 2 weeks, patient reports depressed mood, fair appetite, decreased energy, decreased concentration, decreased  motivation, but intact sleep.  She has not been attending her PCP and cardiology appointments as scheduled, but does attend psychiatry appointments.  She reports that she has symptoms of mania 1-2 times per month in which she becomes irritable, increased energy, rapid speech, 3 to 4 days with decreased need for sleep, and racing thoughts.  She endorses auditory hallucinations telling her to self-harm during this times as well.  Patient does report that with history of trauma, she had nightmares, but the prazosin has helped with these.  Patient denies substance use including cigarettes, marijuana, other illicit drugs, and alcohol.  She does report chest pain and nausea that both occurred yesterday, but have since resolved.  Today, she continues to endorse chronic myalgias that are improved with gabapentin.  As well, she reports tensing of her jaw each night when she takes her Zyprexa, and lasting until lunchtime the next day.  She says that she refuses to take Benadryl because her husband overdosed and died with Benadryl.  Hydroxyzine has not worked for her, but she has not trialed Cogentin, and is open to a trial.  Patient denies further somatic symptoms to include vomiting, abdominal pain, diarrhea or constipation, and headache.   POA/Legal Guardian: Denies  Past Psychiatric Hx: Previous Psych Diagnoses: Schizoaffective disorder bipolar type, Anxiety, Autism spectrum disorder, PTSD, Borderline personality disorder  Prior inpatient treatment: Multiple. This is the 9th [facility] admission within a 12 month period. Outpatient treatment: has ACT team   Prior rehab hx: Denies History of suicide: multiple attempts mostly via overdose on her prescription medications. Most  is this admission. History of homicide: Denies Psychiatric medication history: Previously trialed Tegretol, Haldol, Clozaril, Depakote, Invega, and Lamictal  Psychiatric medication compliance history: Poor  Substance Abuse Hx: Alcohol:  Reports occasional drinks every month, states that she drinks 1-2 glasses of wine when she drinks ETOH. Non most recently as per patient Tobacco: Denies Illicit drugs: Denies Rx drug abuse: History of overdose  Past Medical History: Fibromyalgia, asthma Current home medications:  Protonix 40 mg Gabapentin 600 mg 3 times daily Zyprexa 10 mg nightly Flexeril 10 mg nightly Prazosin 1 mg nightly Voltaren gel daily as needed Breyna inhaler Lexapro 20 mg daily   Prior Hosp: Multiple for suicide attempts Prior Surgeries/Trauma (Head trauma, LOC, concussions, seizures): History of seizures, with last December 2017.   Family History: Family History  Problem Relation Age of Onset   Mental illness Father    Asthma Father    PDD Brother    Seizures Brother       Social History: Pt reports that she lives with her ex BF who since to move out at the end of the year, and has a child who currently lives with paternal grandmother (deceased husband's mother). She reports that she does not have a job and is currently receiving disability; she has attempted 10 jobs over the past 12 months in order to reunify with her daughter, but this is since caused her to be ineligible to recertify for SSI benefits. Reports that she has a "CST" team who are supportive to her. Reports that her husband completed suicide on Benadryl..    Is the patient at risk to self? Yes.    Has the patient been a risk to self in the past 6 months? Yes.    Has the patient been a risk to self within the distant past? Yes.    Is the patient a risk to others? No.  Has the patient been a risk to others in the past 6 months? No.  Has the patient been a risk to others within the distant past? No.    Alcohol Screening:  1. How often do you have a drink containing alcohol?: Never 2. How many drinks containing alcohol do you have on a typical day when you are drinking?: 1 or 2 3. How often do you have six or more drinks on one  occasion?: Never AUDIT-C Score: 0 4. How often during the last year have you found that you were not able to stop drinking once you had started?: Never 5. How often during the last year have you failed to do what was normally expected from you because of drinking?: Never 6. How often during the last year have you needed a first drink in the morning to get yourself going after a heavy drinking session?: Never 7. How often during the last year have you had a feeling of guilt of remorse after drinking?: Never 8. How often during the last year have you been unable to remember what happened the night before because you had been drinking?: Never 9. Have you or someone else been injured as a result of your drinking?: No 10. Has a relative or friend or a doctor or another health worker been concerned about your drinking or suggested you cut down?: No Alcohol Use Disorder Identification Test Final Score (AUDIT): 0 Substance Abuse History in the last 12 months:  No. Consequences of Substance Abuse: NA Previous Psychotropic Medications: Yes  Psychological Evaluations: Yes  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 fragile X syndrome    Chronic constipation    Depression    Drug-seeking behavior    Essential tremor    Headache    Overdose of acetaminophen 07/2017   and other meds   Personality disorder (HCC)    Schizo-affective psychosis (HCC)    Schizoaffective disorder, bipolar type (HCC)    Seizures (HCC)    Last seizure December 2017   Sleep apnea     Past Surgical History:  Procedure Laterality Date   MOUTH SURGERY  2009 or 2010   Family History:  Family History  Problem Relation Age of Onset   Mental illness Father    Asthma Father    PDD Brother    Seizures Brother     Tobacco Screening:   Social History:  Social History   Substance and Sexual Activity   Alcohol Use No   Alcohol/week: 1.0 standard drink of alcohol   Types: 1 Standard drinks or equivalent per week   Comment: denies at this time     Social History   Substance and Sexual Activity  Drug Use No   Comment: History of cocaine use at age 67 for 4 months    Additional Social History:  Allergies:   Allergies  Allergen Reactions   Bee Venom Anaphylaxis   Coconut Flavor Anaphylaxis and Rash   Fish Allergy Anaphylaxis   Geodon [Ziprasidone Hcl] Other (See Comments)    Pt states that this medication causes paralysis of the mouth.     Haloperidol And Related Other (See Comments)    Pt states that this medication causes paralysis of the mouth, jaw locks up   Lithobid [Lithium] Other (See Comments)    Seizure-like activity    Roxicodone [Oxycodone] Other (See Comments)    Hallucinations    Seroquel [Quetiapine] Other (See Comments)    Severe drowsiness Pt currently taking 12.5 mg BID, she is ok with this dose   Shellfish Allergy Anaphylaxis   Phenergan [Promethazine Hcl] Other (See Comments)    Chest pain     Prilosec [Omeprazole] Nausea And Vomiting    Pt can take protonix with no problems    Sulfa Antibiotics Other (See Comments)    Chest pain    Tegretol [Carbamazepine] Nausea And Vomiting   Prozac [Fluoxetine] Other (See Comments)    Increased Depression Suicidal thoughts   Tape Other (See Comments)    Skin tears, can only tolerate paper tape.   Tylenol [Acetaminophen] Rash and Other (See Comments)    Rash on face    Lab Results:  Results for orders placed or performed during the hospital encounter of 07/06/22 (from the past 48 hour(s))  POC urine preg, ED     Status: Normal   Collection Time: 07/06/22  1:54 AM  Result Value Ref Range   Preg Test, Ur Negative Negative  POCT Urine Drug Screen - (I-Screen)     Status: Abnormal   Collection Time: 07/06/22  1:54 AM  Result Value Ref Range   POC Amphetamine UR None Detected NONE DETECTED (Cut Off Level 1000  ng/mL)   POC Secobarbital (BAR) None Detected NONE DETECTED (Cut Off Level 300 ng/mL)   POC Buprenorphine (BUP) None Detected NONE DETECTED (Cut Off Level 10 ng/mL)   POC Oxazepam (BZO) Positive (A) NONE DETECTED (Cut Off Level 300 ng/mL)  POC Cocaine UR None Detected NONE DETECTED (Cut Off Level 300 ng/mL)   POC Methamphetamine UR None Detected NONE DETECTED (Cut Off Level 1000 ng/mL)   POC Morphine None Detected NONE DETECTED (Cut Off Level 300 ng/mL)   POC Methadone UR None Detected NONE DETECTED (Cut Off Level 300 ng/mL)   POC Oxycodone UR None Detected NONE DETECTED (Cut Off Level 100 ng/mL)   POC Marijuana UR None Detected NONE DETECTED (Cut Off Level 50 ng/mL)  Pregnancy, urine POC     Status: None   Collection Time: 07/06/22  1:58 AM  Result Value Ref Range   Preg Test, Ur NEGATIVE NEGATIVE    Comment:        THE SENSITIVITY OF THIS METHODOLOGY IS >24 mIU/mL   Comprehensive metabolic panel     Status: Abnormal   Collection Time: 07/06/22 11:15 AM  Result Value Ref Range   Sodium 133 (L) 135 - 145 mmol/L   Potassium 3.2 (L) 3.5 - 5.1 mmol/L   Chloride 104 98 - 111 mmol/L   CO2 23 22 - 32 mmol/L   Glucose, Bld 101 (H) 70 - 99 mg/dL    Comment: Glucose reference range applies only to samples taken after fasting for at least 8 hours.   BUN 6 6 - 20 mg/dL   Creatinine, Ser 1.61 0.44 - 1.00 mg/dL   Calcium 8.9 8.9 - 09.6 mg/dL   Total Protein 6.8 6.5 - 8.1 g/dL   Albumin 3.8 3.5 - 5.0 g/dL   AST 15 15 - 41 U/L   ALT 14 0 - 44 U/L   Alkaline Phosphatase 71 38 - 126 U/L   Total Bilirubin 0.4 0.3 - 1.2 mg/dL   GFR, Estimated >04 >54 mL/min    Comment: (NOTE) Calculated using the CKD-EPI Creatinine Equation (2021)    Anion gap 6 5 - 15    Comment: Performed at [facility], [address] [place], [place] [address]  CBC with Differential     Status: Abnormal   Collection Time: 07/06/22 11:15 AM  Result Value Ref Range   WBC 7.2 4.0 - 10.5 K/uL   RBC 4.65 3.87 -  5.11 MIL/uL   Hemoglobin 11.8 (L) 12.0 - 15.0 g/dL   HCT 91.4 78.2 - 95.6 %   MCV 80.2 80.0 - 100.0 fL   MCH 25.4 (L) 26.0 - 34.0 pg   MCHC 31.6 30.0 - 36.0 g/dL   RDW 21.3 08.6 - 57.8 %   Platelets 274 150 - 400 K/uL   nRBC 0.0 0.0 - 0.2 %   Neutrophils Relative % 60 %   Neutro Abs 4.3 1.7 - 7.7 K/uL   Lymphocytes Relative 32 %   Lymphs Abs 2.3 0.7 - 4.0 K/uL   Monocytes Relative 7 %   Monocytes Absolute 0.5 0.1 - 1.0 K/uL   Eosinophils Relative 0 %   Eosinophils Absolute 0.0 0.0 - 0.5 K/uL   Basophils Relative 1 %   Basophils Absolute 0.0 0.0 - 0.1 K/uL   Immature Granulocytes 0 %   Abs Immature Granulocytes 0.02 0.00 - 0.07 K/uL    Comment: Performed at [facility], [address], [place], [place] [address]    Blood Alcohol level:  Lab Results  Component Value Date   [facility] <10 06/22/2022   ETH <10 06/16/2022    Metabolic Disorder Labs:  Lab Results  Component Value Date   HGBA1C 5.2 04/20/2022   MPG 103 04/20/2022   MPG  96.8 10/07/2021   Lab Results  Component Value Date   PROLACTIN 66.2 (H) 03/02/2021   PROLACTIN 66.1 (H) 02/04/2020   Lab Results  Component Value Date   CHOL 191 04/20/2022   TRIG 163 (H) 04/20/2022   HDL 52 04/20/2022   CHOLHDL 3.7 04/20/2022   VLDL 33 04/20/2022   LDLCALC 106 (H) 04/20/2022   LDLCALC 102 (H) 12/10/2021    Current Medications: Current Facility-Administered Medications  Medication Dose Route Frequency Provider Last Rate Last Admin   albuterol (VENTOLIN HFA) 108 (90 Base) MCG/ACT inhaler 2 puff  2 puff Inhalation Q6H PRN White, Patrice L, NP       alum & mag hydroxide-simeth (MAALOX/MYLANTA) 200-200-20 MG/5ML suspension 30 mL  30 mL Oral Q4H PRN White, Patrice L, NP       benztropine (COGENTIN) tablet 1 mg  1 mg Oral QHS Lamar Sprinkles, MD       cyclobenzaprine (FLEXERIL) tablet 10 mg  10 mg Oral QHS White, Patrice L, NP   10 mg at 07/06/22 2127   diphenhydrAMINE (BENADRYL) capsule 50 mg  50 mg Oral TID PRN White,  Patrice L, NP       Or   diphenhydrAMINE (BENADRYL) injection 50 mg  50 mg Intramuscular TID PRN White, Patrice L, NP       escitalopram (LEXAPRO) tablet 20 mg  20 mg Oral Daily White, Patrice L, NP   20 mg at 07/07/22 1033   gabapentin (NEURONTIN) capsule 600 mg  600 mg Oral TID White, Patrice L, NP   600 mg at 07/07/22 1348   hydrOXYzine (ATARAX) tablet 25 mg  25 mg Oral TID PRN White, Patrice L, NP   25 mg at 07/06/22 1624   ibuprofen (ADVIL) tablet 600 mg  600 mg Oral Q8H PRN White, Patrice L, NP       LORazepam (ATIVAN) tablet 2 mg  2 mg Oral TID PRN Liborio Nixon L, NP   2 mg at 07/06/22 2128   Or   LORazepam (ATIVAN) injection 2 mg  2 mg Intramuscular TID PRN White, Patrice L, NP       magnesium hydroxide (MILK OF MAGNESIA) suspension 30 mL  30 mL Oral Daily PRN White, Patrice L, NP       magnesium hydroxide (MILK OF MAGNESIA) suspension 30 mL  30 mL Oral Daily PRN White, Patrice L, NP       melatonin tablet 3 mg  3 mg Oral QHS White, Patrice L, NP   3 mg at 07/06/22 2129   mometasone-formoterol (DULERA) 200-5 MCG/ACT inhaler 2 puff  2 puff Inhalation BID White, Patrice L, NP   2 puff at 07/07/22 1034   OLANZapine zydis (ZYPREXA) disintegrating tablet 10 mg  10 mg Oral QHS White, Patrice L, NP   10 mg at 07/06/22 2128   pantoprazole (PROTONIX) EC tablet 40 mg  40 mg Oral Daily White, Patrice L, NP   40 mg at 07/07/22 0610   prazosin (MINIPRESS) capsule 1 mg  1 mg Oral QHS White, Patrice L, NP   1 mg at 07/06/22 2127   PTA Medications: Medications Prior to Admission  Medication Sig Dispense Refill Last Dose   albuterol (VENTOLIN HFA) 108 (90 Base) MCG/ACT inhaler Inhale 2 puffs into the lungs every 6 (six) hours as needed for wheezing or shortness of breath.      BREYNA 160-4.5 MCG/ACT inhaler Inhale 1 puff into the lungs in the morning and at bedtime.  cyclobenzaprine (FLEXERIL) 10 MG tablet Take 10 mg by mouth at bedtime.      diclofenac Sodium (VOLTAREN) 1 % GEL Apply 2 g  topically 2 (two) times daily. 2 g 0    escitalopram (LEXAPRO) 20 MG tablet Take 1 tablet (20 mg total) by mouth daily.      gabapentin (NEURONTIN) 600 MG tablet Take 600 mg by mouth 3 (three) times daily.      hydrOXYzine (ATARAX) 25 MG tablet Take 1 tablet (25 mg total) by mouth 3 (three) times daily as needed for anxiety. 30 tablet 0    melatonin 3 MG TABS tablet Take 1 tablet (3 mg total) by mouth at bedtime. 30 tablet 0    OLANZapine zydis (ZYPREXA) 10 MG disintegrating tablet Take 1 tablet (10 mg total) by mouth at bedtime.      pantoprazole (PROTONIX) 40 MG tablet Take 40 mg by mouth daily.      prazosin (MINIPRESS) 1 MG capsule Take 1 mg by mouth at bedtime.      traZODone (DESYREL) 50 MG tablet Take 1 tablet (50 mg total) by mouth at bedtime as needed for up to 7 days for sleep. (Patient taking differently: Take 50 mg by mouth at bedtime.) 7 tablet 0     Musculoskeletal: Strength & Muscle Tone: within normal limits Gait & Station: normal Patient leans: N/A    Psychiatric Specialty Exam:  Presentation  General Appearance: Appropriate for Environment    Eye Contact:Fair    Speech:Clear and Coherent    Speech Volume:Normal    Handedness:Right    Mood and Affect  Mood:Depressed    Affect:Congruent     Thought Process  Thought Processes:Coherent    Duration of Psychotic Symptoms: Greater than six months   Past Diagnosis of Schizophrenia or Psychoactive disorder: Yes   Descriptions of Associations:Intact    Orientation:Full (Time, Place and Person)    Thought Content:Logical    Hallucinations:Hallucinations: None    Ideas of Reference:None    Suicidal Thoughts:Suicidal Thoughts: Yes, Active SI Active Intent and/or Plan: With Plan; With Means to Carry Out; With Intent    Homicidal Thoughts:Homicidal Thoughts: No     Sensorium  Memory:Immediate Fair; Recent Fair; Remote  Fair    Judgment:Poor    Insight:Poor     Executive Functions  Concentration:Fair    Attention Span:Fair    Recall:Fair    Fund of Knowledge:Fair    Language:Fair     Psychomotor Activity  Psychomotor Activity:Psychomotor Activity: Normal     Assets  Assets:Communication Skills; Desire for Improvement; Housing; Leisure Time; Social Support     Sleep  Sleep:Sleep: Fair Number of Hours of Sleep: 7      Physical Exam: Physical Exam Vitals reviewed.  Constitutional:      General: She is not in acute distress. HENT:     Head: Normocephalic and atraumatic.     Mouth/Throat:     Mouth: Mucous membranes are moist.     Pharynx: Oropharynx is clear.  Pulmonary:     Effort: Pulmonary effort is normal.  Skin:    General: Skin is warm and dry.  Neurological:     General: No focal deficit present.     Mental Status: She is alert and oriented to person, place, and time.     Motor: No weakness.     Gait: Gait normal.    Review of Systems  HENT:         Reports tightening of the jaw with each  Zyprexa dose  Gastrointestinal: Negative.   Genitourinary: Negative.   Musculoskeletal:  Positive for myalgias.  Neurological:  Positive for dizziness. Negative for seizures, weakness and headaches.   Blood pressure 121/62, pulse 90, temperature 98.4 F (36.9 C), temperature source Oral, resp. rate 16, height 5\' 4"  (1.626 m), weight 95.3 kg, SpO2 99 %. Body mass index is 36.05 kg/m.   ASSESSMENT: Principal Problem:   Schizoaffective disorder, depressive type (HCC) Active Problems:   Borderline personality disorder (HCC)   Asperger syndrome   Suicidal ideation   PTSD (post-traumatic stress disorder)   Fibromyalgia   [facility] day 1.   Treatment Plan Summary: Daily contact with patient to assess and evaluate symptoms and progress in treatment and Medication management  Physician Treatment Plan for Principal and Active Diagnoses: Long Term Goal(s):  Improvement in symptoms so as ready for discharge  Short Term Goals: Ability to identify changes in lifestyle to reduce recurrence of condition will improve, Ability to verbalize feelings will improve, Ability to disclose and discuss suicidal ideas, Ability to demonstrate self-control will improve, Ability to identify and develop effective coping behaviors will improve, Ability to maintain clinical measurements within normal limits will improve, and Compliance with prescribed medications will improve    I certify that inpatient services furnished can reasonably be expected to improve the patient's condition.    Assessment: Patient with multiple psychiatric admissions.  She has not been a behavioral issue on the unit, particularly in comparison to previous admissions.  Patient has a one-to-one sitter, which she requests to keep due to thoughts of self-harm.  Patient reports that although the thoughts are fleeting, she has a history of scratching her her forearms with her nails and subsequently acquiring multiple skin infections.  She says that she would like to avoid doing so, and having the sitter is beneficial.  No medication changes to be made today, as patient has been largely stable with an acute stressor.  She is not engaged in self-harm prior to admission, but sought help prior to doing so.  Diagnoses / Active Problems:  Safety and Monitoring: voluntarily admission to inpatient psychiatric unit for safety, stabilization and treatment Daily contact with patient to assess and evaluate symptoms and progress in treatment Patient's case to be discussed in multi-disciplinary team meeting Observation Level : q15 minute checks Vital signs: q12 hours Precautions: suicide, elopement, and assault  2. Psychiatric Diagnoses and Treatment #Suicidal ideation # Schizoaffective disorder, depressive type #PTSD #Borderline personality disorder -Continue Lexapro 20 mg daily - Continue  Zyprexa 10 mg  nightly -Start Cogentin 1 mg nightly for EPS - Continue prazosin 1 mg nightly -Continue melatonin 3 mg nightly  PRN: Continue PRN's: Tylenol, Maalox, Atarax, Milk of Magnesia, Trazodone   -- The risks/benefits/side-effects/alternatives to this medication were discussed in detail with the patient and time was given for questions. The patient consents to medication trial.              -- Metabolic profile and EKG monitoring obtained while on an atypical antipsychotic  BMI: 36.05 TSH: WNL 05/12/25 Lipid Panel: Pending HbgA1c: 5.2% on 3/15 QTc: 443             -- Encouraged patient to participate in unit milieu and in scheduled group therapies     3. Medical Issues Being Addressed: #Fibromyalgia - Continue gabapentin 600 mg 3 times daily -Continue Flexeril 10 mg nightly  #Asthma - Continue Dulera inhaler 2 puffs twice daily  #GERD - Continue Protonix 40 mg daily  4. Discharge Planning:              -- Social work and case management to assist with discharge planning and identification of hospital follow-up needs prior to discharge             -- Estimated LOS: 5-7 days             -- Discharge Concerns: Need to establish a safety plan; Medication compliance and effectiveness             -- Discharge Goals: Return home with outpatient referrals for mental health follow-up including medication management/psychotherapy    Lamar Sprinkles, MD 6/1/20245:00 PM

## 2022-07-08 NOTE — Progress Notes (Signed)
Nursing 1:1 note:   [name] found in her room, sited on her bed reading her Bible quietly. Remains on 1:1 for safety. Verbalizes waiting to attend group and feeling better today.

## 2022-07-08 NOTE — Progress Notes (Signed)
D/C 1:1 Note:   Pt does not appear to be in distress. Pt is able to verbal contract for safety. Pt remains safe.

## 2022-07-08 NOTE — Group Note (Signed)
Date:  07/08/2022 Time:  4:59 PM  Group Topic/Focus:  Building Self Esteem:   The Focus of this group is helping patients become aware of the effects of self-esteem on their lives, the things they and others do that enhance or undermine their self-esteem, seeing the relationship between their level of self-esteem and the choices they make and learning ways to enhance self-esteem. Dimensions of Wellness:   The focus of this group is to introduce the topic of wellness and discuss the role each dimension of wellness plays in total health. Emotional Education:   The focus of this group is to discuss what feelings/emotions are, and how they are experienced.    Participation Level:  Did Not Attend  Participation Quality:   n/a  Affect:   n/a  Cognitive:   n/a  Insight: None  Engagement in Group:   n/a  Modes of Intervention:   n/a  Additional Comments:   Pt did not attend.  Edmund Hilda Tandi Hanko 07/08/2022, 4:59 PM

## 2022-07-08 NOTE — Progress Notes (Signed)
D/C 1:1 Note :    Pt is taking a nap, respirations even and unlabored. Pt remains safe.

## 2022-07-08 NOTE — Group Note (Signed)
Date:  07/08/2022 Time:  1:29 PM  Group Topic/Focus:  Goals Group:   The focus of this group is to help patients establish daily goals to achieve during treatment and discuss how the patient can incorporate goal setting into their daily lives to aide in recovery. Orientation:   The focus of this group is to educate the patient on the purpose and policies of crisis stabilization and provide a format to answer questions about their admission.  The group details unit policies and expectations of patients while admitted.    Participation Level:  Did Not Attend  Participation Quality:   n/a  Affect:   n/a  Cognitive:   n/a  Insight: None  Engagement in Group:   n/a  Modes of Intervention:   n/a  Additional Comments:   Pt did not attend.  Edmund Hilda Yvonnia Tango 07/08/2022, 1:29 PM

## 2022-07-08 NOTE — Progress Notes (Signed)
1:1 Note   Pt took her medications. Pt asked if she could get off 1:1. Pt denies SI/HI/AVH. Pt appears brighter this a.m., Pt states she wants to go home to see her daughter and continue volunteering at the hospital. Pt is able to verbal contract for safety. 1:1 maintained for safety.

## 2022-07-08 NOTE — Progress Notes (Signed)
Nursing 1:1 note:   Patient in bed, asleep. Respirations are even and non-labored. No distress noted. Remains on 1:1 for safety. Staff present at bedside

## 2022-07-08 NOTE — Progress Notes (Addendum)
Pt sign a 72 hr request for d/c on 07/08/2022 at 1541. Placed in front of chart. Dr. Alfonse Flavors is aware.   Pt states she hopes to be d/c tomorrow, Pt states she needs to go home to pay her bills and see her daughter.

## 2022-07-08 NOTE — Progress Notes (Signed)
Nursing 1:1 note:   Patient in bed, fell asleep. Respirations are even and non-labored. Remains on 1:1 for safety. Pt has been quiet and cooperative this shift, returns to her room after group and med administration, Pt feels more comfortable with 1:1 reports she still had thoughts of self-harm but contracted for safety.

## 2022-07-08 NOTE — [facility] Group Notes (Signed)
[facility] Group Notes:  (Nursing)  Date:  07/08/2022  Time:  1415  Type of Therapy:  Psychoeducational Skills  Participation Level:  Did Not Attend   Shela Nevin 07/08/2022, 3:29 PM

## 2022-07-08 NOTE — Progress Notes (Signed)
D/C 1:1 Note :    Pt is able to verbal contract for safety. Pt does not appear to be in distress. Pt is calm on presentation. Pt remains safe.

## 2022-07-08 NOTE — Progress Notes (Signed)
[facility] MD Progress Note  07/08/2022 10:11 AM [name]  MRN:  [id number]  Principal Problem: Schizoaffective disorder, depressive type (HCC) Diagnosis: Principal Problem:   Schizoaffective disorder, depressive type (HCC) Active Problems:   Borderline personality disorder (HCC)   Asperger syndrome   Suicidal ideation   PTSD (post-traumatic stress disorder)   Fibromyalgia   Subjective:   Evelyn Buttrey. Ward is a 32 yo female with past psychiatric history of PTSD, borderline personality d/o & schizoaffective d/o who was admitted voluntarily for SI with a plan to slice her throat in the setting of SSI not being accepted for recertification.   Chart Review of Past 24 Hours: Per MAR, patient has been compliant with all scheduled medications. Patient has been attending groups appropriately and has not been a behavioral concern. Patient required the following behavioral PRNs: Hydroxyzine 25 mg x 1  Yesterday's Recommendations per Psychiatry Team: -Continue Lexapro 20 mg daily - Continue  Zyprexa 10 mg nightly -Start Cogentin 1 mg nightly for EPS - Continue prazosin 1 mg nightly -Continue melatonin 3 mg nightly   On Evaluation Today: Patient reports that she is ready to have the 1:1 sitter discontinued. She says that she is sure that she will be able to keep herself safe. She reports that she does not have the desire to scratch her forearm with her fingernails. She says that if she does have suicidal or self-harm thoughts resume, she would go to the nurse's station and inform someone, as she has done during previous hospitalizations. She says that she believes that she is ready for discharge but understands that safety monitoring has to occur prior to discharge. She reports "good" mood, sleep, and appetite. She denies SI, HI, and AVH last night or this AM. Patient requests access to her phone in order to pay her bills; that is her only concern this AM. Patient had a dose of Cogentin with her  Zyprexa last night, and she states that it helped with the jaw tensing she previously experienced. She denies all other somatic concerns or complaints today.   Total Time spent with patient: 30 minutes  Past Psychiatric History: Reviewed from H&P and no changes  Past Medical History: Reviewed from H&P and no changes Past Medical History:  Diagnosis Date   Acid reflux    Anxiety    Asthma    last attack 03/13/15 or 03/14/15   Autism    Carrier of fragile X syndrome    Chronic constipation    Depression    Drug-seeking behavior    Essential tremor    Headache    Overdose of acetaminophen 07/2017   and other meds   Personality disorder (HCC)    Schizo-affective psychosis (HCC)    Schizoaffective disorder, bipolar type (HCC)    Seizures (HCC)    Last seizure December 2017   Sleep apnea     Past Surgical History:  Procedure Laterality Date   MOUTH SURGERY  2009 or 2010   Family History:  Family History  Problem Relation Age of Onset   Mental illness Father    Asthma Father    PDD Brother    Seizures Brother    Family Psychiatric  History: Reviewed from H&P and no changes Social History:  Social History   Substance and Sexual Activity  Alcohol Use No   Alcohol/week: 1.0 standard drink of alcohol   Types: 1 Standard drinks or equivalent per week   Comment: denies at this time     Social  History   Substance and Sexual Activity  Drug Use No   Comment: History of cocaine use at age 59 for 4 months    Social History   Socioeconomic History   Marital status: Widowed    Spouse name: Not on file   Number of children: 0   Years of education: Not on file   Highest education level: Not on file  Occupational History   Occupation: disability  Tobacco Use   Smoking status: Former    Packs/day: 0    Types: Cigarettes   Smokeless tobacco: Never   Tobacco comments:    Smoked for 2  years age [age]  Vaping Use   Vaping Use: Never used  Substance and Sexual Activity    Alcohol use: No    Alcohol/week: 1.0 standard drink of alcohol    Types: 1 Standard drinks or equivalent per week    Comment: denies at this time   Drug use: No    Comment: History of cocaine use at age 39 for 4 months   Sexual activity: Not Currently    Birth control/protection: None  Other Topics Concern   Not on file  Social History Narrative   Marital status: Widowed      Children: daughter      Lives: with boyfriend, in two story home      Employment:  Disability      Tobacco: quit smoking; smoked for two years.      Alcohol ;none      Drugs: none   Has not traveled outside of the country.   Right handed         Social Determinants of Health   Financial Resource Strain: Not on file  Food Insecurity: No Food Insecurity (07/06/2022)   Hunger Vital Sign    Worried About Running Out of Food in the Last Year: Never true    Ran Out of Food in the Last Year: Never true  Transportation Needs: No Transportation Needs (07/06/2022)   PRAPARE - Administrator, Civil Service (Medical): No    Lack of Transportation (Non-Medical): No  Recent Concern: Transportation Needs - Unmet Transportation Needs (05/06/2022)   PRAPARE - Administrator, Civil Service (Medical): Yes    Lack of Transportation (Non-Medical): Yes  Physical Activity: Not on file  Stress: Not on file  Social Connections: Not on file   Additional Social History:                         Sleep: Good  Appetite:  Good  Current Medications: Current Facility-Administered Medications  Medication Dose Route Frequency Provider Last Rate Last Admin   albuterol (VENTOLIN HFA) 108 (90 Base) MCG/ACT inhaler 2 puff  2 puff Inhalation Q6H PRN White, Patrice L, NP       alum & mag hydroxide-simeth (MAALOX/MYLANTA) 200-200-20 MG/5ML suspension 30 mL  30 mL Oral Q4H PRN White, Patrice L, NP       benztropine (COGENTIN) tablet 1 mg  1 mg Oral QHS Lamar Sprinkles, MD   1 mg at 07/07/22 2146    cyclobenzaprine (FLEXERIL) tablet 10 mg  10 mg Oral QHS White, Patrice L, NP   10 mg at 07/07/22 2146   diphenhydrAMINE (BENADRYL) capsule 50 mg  50 mg Oral TID PRN White, Patrice L, NP       Or   diphenhydrAMINE (BENADRYL) injection 50 mg  50 mg Intramuscular TID PRN White, Patrice L,  NP       escitalopram (LEXAPRO) tablet 20 mg  20 mg Oral Daily White, Patrice L, NP   20 mg at 07/08/22 0743   gabapentin (NEURONTIN) capsule 600 mg  600 mg Oral TID Liborio Nixon L, NP   600 mg at 07/08/22 4098   hydrOXYzine (ATARAX) tablet 25 mg  25 mg Oral TID PRN Liborio Nixon L, NP   25 mg at 07/08/22 0901   ibuprofen (ADVIL) tablet 600 mg  600 mg Oral Q8H PRN White, Patrice L, NP       LORazepam (ATIVAN) tablet 2 mg  2 mg Oral TID PRN Liborio Nixon L, NP   2 mg at 07/06/22 2128   Or   LORazepam (ATIVAN) injection 2 mg  2 mg Intramuscular TID PRN White, Patrice L, NP       magnesium hydroxide (MILK OF MAGNESIA) suspension 30 mL  30 mL Oral Daily PRN White, Patrice L, NP       magnesium hydroxide (MILK OF MAGNESIA) suspension 30 mL  30 mL Oral Daily PRN White, Patrice L, NP       melatonin tablet 3 mg  3 mg Oral QHS White, Patrice L, NP   3 mg at 07/07/22 2145   mometasone-formoterol (DULERA) 200-5 MCG/ACT inhaler 2 puff  2 puff Inhalation BID White, Patrice L, NP   2 puff at 07/08/22 0743   OLANZapine zydis (ZYPREXA) disintegrating tablet 10 mg  10 mg Oral QHS White, Patrice L, NP   10 mg at 07/07/22 2146   pantoprazole (PROTONIX) EC tablet 40 mg  40 mg Oral Daily White, Patrice L, NP   40 mg at 07/08/22 0611   prazosin (MINIPRESS) capsule 1 mg  1 mg Oral QHS White, Patrice L, NP   1 mg at 07/07/22 2144    Lab Results:  Results for orders placed or performed during the hospital encounter of 07/06/22 (from the past 48 hour(s))  Comprehensive metabolic panel     Status: Abnormal   Collection Time: 07/06/22 11:15 AM  Result Value Ref Range   Sodium 133 (L) 135 - 145 mmol/L   Potassium 3.2 (L) 3.5 - 5.1  mmol/L   Chloride 104 98 - 111 mmol/L   CO2 23 22 - 32 mmol/L   Glucose, Bld 101 (H) 70 - 99 mg/dL    Comment: Glucose reference range applies only to samples taken after fasting for at least 8 hours.   BUN 6 6 - 20 mg/dL   Creatinine, Ser 1.19 0.44 - 1.00 mg/dL   Calcium 8.9 8.9 - 14.7 mg/dL   Total Protein 6.8 6.5 - 8.1 g/dL   Albumin 3.8 3.5 - 5.0 g/dL   AST 15 15 - 41 U/L   ALT 14 0 - 44 U/L   Alkaline Phosphatase 71 38 - 126 U/L   Total Bilirubin 0.4 0.3 - 1.2 mg/dL   GFR, Estimated >82 >95 mL/min    Comment: (NOTE) Calculated using the CKD-EPI Creatinine Equation (2021)    Anion gap 6 5 - 15    Comment: Performed at [facility], [address], [place], [place] [address]  CBC with Differential     Status: Abnormal   Collection Time: 07/06/22 11:15 AM  Result Value Ref Range   WBC 7.2 4.0 - 10.5 K/uL   RBC 4.65 3.87 - 5.11 MIL/uL   Hemoglobin 11.8 (L) 12.0 - 15.0 g/dL   HCT 86.5 78.4 - 69.6 %   MCV 80.2  80.0 - 100.0 fL   MCH 25.4 (L) 26.0 - 34.0 pg   MCHC 31.6 30.0 - 36.0 g/dL   RDW 16.1 09.6 - 04.5 %   Platelets 274 150 - 400 K/uL   nRBC 0.0 0.0 - 0.2 %   Neutrophils Relative % 60 %   Neutro Abs 4.3 1.7 - 7.7 K/uL   Lymphocytes Relative 32 %   Lymphs Abs 2.3 0.7 - 4.0 K/uL   Monocytes Relative 7 %   Monocytes Absolute 0.5 0.1 - 1.0 K/uL   Eosinophils Relative 0 %   Eosinophils Absolute 0.0 0.0 - 0.5 K/uL   Basophils Relative 1 %   Basophils Absolute 0.0 0.0 - 0.1 K/uL   Immature Granulocytes 0 %   Abs Immature Granulocytes 0.02 0.00 - 0.07 K/uL    Comment: Performed at [facility], [address], [place], [place] [address]    Blood Alcohol level:  Lab Results  Component Value Date   ETH <10 06/22/2022   ETH <10 06/16/2022    Metabolic Disorder Labs: Lab Results  Component Value Date   HGBA1C 5.2 04/20/2022   MPG 103 04/20/2022   MPG 96.8 10/07/2021   Lab Results  Component Value Date   PROLACTIN 66.2 (H) 03/02/2021   PROLACTIN 66.1  (H) 02/04/2020   Lab Results  Component Value Date   CHOL 191 04/20/2022   TRIG 163 (H) 04/20/2022   HDL 52 04/20/2022   CHOLHDL 3.7 04/20/2022   VLDL 33 04/20/2022   LDLCALC 106 (H) 04/20/2022   LDLCALC 102 (H) 12/10/2021    Physical Findings: AIMS:   ,  ,   ,   ,      Musculoskeletal: Strength & Muscle Tone: within normal limits Gait & Station: normal Patient leans: N/A  Psychiatric Specialty Exam:  Presentation  General Appearance:  Appropriate for Environment; Casual   Eye Contact: Good   Speech: Clear and Coherent; Normal Rate   Speech Volume: Normal   Handedness: Right    Mood and Affect  Mood: Euthymic; Anxious   Affect: Congruent    Thought Process  Thought Processes: Coherent; Goal Directed   Descriptions of Associations:Intact   Orientation:Full (Time, Place and Person)   Thought Content:Logical; WDL   History of Schizophrenia/Schizoaffective disorder:Yes   Duration of Psychotic Symptoms:Greater than six months   Hallucinations:Hallucinations: None  Ideas of Reference:None   Suicidal Thoughts:Suicidal Thoughts: No  Homicidal Thoughts:Homicidal Thoughts: No   Sensorium  Memory: Immediate Fair; Recent Fair   Judgment: Fair   Insight: Shallow; Lacking; Present    Executive Functions  Concentration: Good   Attention Span: Good   Recall: Fair   Fund of Knowledge: Good   Language: Good    Psychomotor Activity  Psychomotor Activity:Psychomotor Activity: Normal   Assets  Assets: Communication Skills; Desire for Improvement; Housing; Leisure Time    Sleep  Sleep:Sleep: Good    Physical Exam: Physical Exam Vitals reviewed.  Constitutional:      General: She is not in acute distress. HENT:     Head: Normocephalic and atraumatic.     Mouth/Throat:     Mouth: Mucous membranes are moist.     Pharynx: Oropharynx is clear.  Pulmonary:     Effort: Pulmonary effort is  normal.  Skin:    General: Skin is warm and dry.  Neurological:     General: No focal deficit present.     Mental Status: She is alert and oriented to person, place, and time.  Motor: No weakness.     Gait: Gait normal.      Review of Systems  HENT:         Reports tightening of the jaw with each Zyprexa dose  Gastrointestinal: Negative.   Genitourinary: Negative.   Musculoskeletal:  Positive for myalgias.  Neurological:  Negative for dizziness. Negative for seizures, weakness and headaches.  Blood pressure 127/77, pulse 80, temperature 98.5 F (36.9 C), temperature source Oral, resp. rate 16, height 5\' 4"  (1.626 m), weight 95.3 kg, SpO2 99 %. Body mass index is 36.05 kg/m.   Treatment Plan Summary: ASSESSMENT: Patient with multiple psychiatric admissions.  She has not been a behavioral issue on the unit, particularly in comparison to previous admissions.  Patient contracts for safety today, and requests the 1:1 sitter to be discontinued. She also, unprompted, states that if the thoughts return, she will go to the nurse's station and inform a staff member. No medication changes to be made today, as patient has been largely stable with an acute stressor.  She did not engage in self-harm prior to admission and sought help prior to doing so. Patient requests discharge, but is advised that safety monitoring must continue, particularly without the sitter before a discharge plan can be made. She voiced understanding and agreement.   Principal Problem:  Active Problems:       PLAN: Safety and Monitoring: voluntarily admission to inpatient psychiatric unit for safety, stabilization and treatment Daily contact with patient to assess and evaluate symptoms and progress in treatment Patient's case to be discussed in multi-disciplinary team meeting Observation Level : q15 minute checks Vital signs: q12 hours Precautions: suicide, elopement, and assault   2. Psychiatric Diagnoses and  Treatment #Suicidal ideation # Schizoaffective disorder, depressive type #PTSD #Borderline personality disorder -Continue Lexapro 20 mg daily - Continue  Zyprexa 10 mg nightly - Continue Cogentin 1 mg nightly for EPS - Continue prazosin 1 mg nightly -Continue melatonin 3 mg nightly   PRN: Continue PRN's: Tylenol, Maalox, Atarax, Milk of Magnesia, Trazodone    -- The risks/benefits/side-effects/alternatives to this medication were discussed in detail with the patient and time was given for questions. The patient consents to medication trial.              -- Metabolic profile and EKG monitoring obtained while on an atypical antipsychotic  BMI: 36.05 TSH: WNL 05/12/25 Lipid Panel: Pending HbgA1c: 5.2% on 3/15 QTc: 443             -- Encouraged patient to participate in unit milieu and in scheduled group therapies      3. Medical Issues Being Addressed: #Fibromyalgia - Continue gabapentin 600 mg 3 times daily -Continue Flexeril 10 mg nightly   #Asthma - Continue Dulera inhaler 2 puffs twice daily   #GERD - Continue Protonix 40 mg daily     4. Discharge Planning:              -- Social work and case management to assist with discharge planning and identification of hospital follow-up needs prior to discharge             -- Estimated LOS: 5-7 days             -- Discharge Concerns: Need to establish a safety plan; Medication compliance and effectiveness             -- Discharge Goals: Return home with outpatient referrals for mental health follow-up including medication management/psychotherapy  Lamar Sprinkles, MD 07/08/2022, 10:11 AM

## 2022-07-08 NOTE — Progress Notes (Signed)
D/C 1:1 Note :    Pt is able to verbal contract for safety. Pt does not appear to be in distress. Pt is calm on presentation. Pt remains safe.  

## 2022-07-09 ENCOUNTER — Encounter (HOSPITAL_COMMUNITY): Payer: Self-pay

## 2022-07-09 MED ORDER — BENZTROPINE MESYLATE 1 MG PO TABS: 1.0000 mg | ORAL_TABLET | Freq: Every day | ORAL | 0 refills | Status: DC

## 2022-07-09 NOTE — Progress Notes (Addendum)
Patient discharged from [facility] on 07/09/22 at 1110. Patient denies SI, plan, and intention. Suicide safety plan completed, reviewed with this RN, given to the patient, and a copy in the chart. Patient denies HI/AVH upon discharge. Patient is alert, oriented, and cooperative. RN provided patient with discharge paperwork and reviewed information with patient. Patient expressed that she understood all of the discharge instructions. Pt was satisfied with belongings returned to her from the locker and at bedside. Discharged patient to [facility] waiting room.

## 2022-07-09 NOTE — Discharge Instructions (Signed)
-  Follow-up with your outpatient psychiatric provider -instructions on appointment date, time, and address (location) are provided to you in discharge paperwork.  -Take your psychiatric medications as prescribed at discharge - instructions are provided to you in the discharge paperwork  -Follow-up with outpatient primary care doctor and other specialists -for management of preventative medicine and any chronic medical disease.  -Recommend abstinence from alcohol, tobacco, and other illicit drug use at discharge.   -If your psychiatric symptoms recur, worsen, or if you have side effects to your psychiatric medications, call your outpatient psychiatric provider, 911, [phone] or go to the nearest emergency department.  -If suicidal thoughts occur, call your outpatient psychiatric provider, 911, [phone] or go to the nearest emergency department.  Naloxone (Narcan) can help reverse an overdose when given to the victim quickly.  [name] offers free naloxone kits and instructions/training on its use.  Add naloxone to your first aid kit and you can help save a life.   Pick up your free kit at the following locations:   [place]:  [name], [address] [place] [address] ([phone]) [facility] [address] [place] [id number] ([phone]) [facility] [address] [place] [address]  [place]: [name] [address] [place] [address] ([phone]) [facility] [address] [place] [address] ([phone])  

## 2022-07-09 NOTE — [facility] Suicide Risk Assessment (Signed)
[facility] INPATIENT:  Family/Significant Other Suicide Prevention Education  Suicide Prevention Education:  Education Completed; Lupita Leash [phone] ,  (name of family member/significant other) has been identified by the patient as the family member/significant other with whom the patient will be residing, and identified as the person(s) who will aid the patient in the event of a mental health crisis (suicidal ideations/suicide attempt).  With written consent from the patient, the family member/significant other has been provided the following suicide prevention education, prior to the and/or following the discharge of the patient.  The suicide prevention education provided includes the following: Suicide risk factors Suicide prevention and interventions National Suicide Hotline telephone number [facility] assessment telephone number [facility] Emergency Assistance 911 [facility] and/or Residential Mobile Crisis Unit telephone number  Request made of family/significant other to: Remove weapons (e.g., guns, rifles, knives), all items previously/currently identified as safety concern.   Remove drugs/medications (over-the-counter, prescriptions, illicit drugs), all items previously/currently identified as a safety concern.  The family member/significant other verbalizes understanding of the suicide prevention education information provided.  The family member/significant other agrees to remove the items of safety concern listed above.  [name] 07/09/2022, 9:40 AM

## 2022-07-09 NOTE — Progress Notes (Signed)
   07/09/22 0806  Psych Admission Type (Psych Patients Only)  Admission Status Voluntary  Psychosocial Assessment  Patient Complaints Anxiety  Eye Contact Fair  Facial Expression Animated;Anxious  Affect Anxious  Speech Logical/coherent  Interaction Assertive  Motor Activity Fidgety  Appearance/Hygiene Unremarkable  Behavior Characteristics Cooperative;Anxious  Mood Anxious  Thought Process  Coherency WDL  Content Preoccupation  Delusions None reported or observed  Perception WDL  Hallucination None reported or observed  Judgment Impaired  Confusion None  Danger to Self  Current suicidal ideation? Denies  Description of Suicide Plan No plan  Self-Injurious Behavior No self-injurious ideation or behavior indicators observed or expressed   Agreement Not to Harm Self Yes  Description of Agreement Pt verbally contracts for safety  Danger to Others  Danger to Others None reported or observed  Danger to Others Abnormal  Harmful Behavior to others No threats or harm toward other people

## 2022-07-09 NOTE — BH IP Treatment Plan (Unsigned)
Interdisciplinary Treatment and Diagnostic Plan Update  07/09/2022 Time of Session: 11:10am [name] MRN: [id number]  Principal Diagnosis: Schizoaffective disorder, depressive type [facility])  Secondary Diagnoses: Principal Problem:   Schizoaffective disorder, depressive type (HCC) Active Problems:   Borderline personality disorder (HCC)   Asperger syndrome   Suicidal ideation   PTSD (post-traumatic stress disorder)   Fibromyalgia   Current Medications:  No current facility-administered medications for this encounter.   Current Outpatient Medications  Medication Sig Dispense Refill   albuterol (VENTOLIN HFA) 108 (90 Base) MCG/ACT inhaler Inhale 2 puffs into the lungs every 6 (six) hours as needed for wheezing or shortness of breath.     benztropine (COGENTIN) 1 MG tablet Take 1 tablet (1 mg total) by mouth at bedtime for 7 doses. 7 tablet 0   BREYNA 160-4.5 MCG/ACT inhaler Inhale 1 puff into the lungs in the morning and at bedtime.     cyclobenzaprine (FLEXERIL) 10 MG tablet Take 10 mg by mouth at bedtime.     diclofenac Sodium (VOLTAREN) 1 % GEL Apply 2 g topically 2 (two) times daily. 2 g 0   escitalopram (LEXAPRO) 20 MG tablet Take 1 tablet (20 mg total) by mouth daily.     gabapentin (NEURONTIN) 600 MG tablet Take 600 mg by mouth 3 (three) times daily.     hydrOXYzine (ATARAX) 25 MG tablet Take 1 tablet (25 mg total) by mouth 3 (three) times daily as needed for anxiety. 30 tablet 0   melatonin 3 MG TABS tablet Take 1 tablet (3 mg total) by mouth at bedtime. 30 tablet 0   OLANZapine zydis (ZYPREXA) 10 MG disintegrating tablet Take 1 tablet (10 mg total) by mouth at bedtime.     pantoprazole (PROTONIX) 40 MG tablet Take 40 mg by mouth daily.     prazosin (MINIPRESS) 1 MG capsule Take 1 mg by mouth at bedtime.     traZODone (DESYREL) 50 MG tablet Take 1 tablet (50 mg total) by mouth at bedtime as needed for up to 7 days for sleep. (Patient taking differently: Take 50 mg by  mouth at bedtime.) 7 tablet 0   PTA Medications: No medications prior to admission.    Patient Stressors: Financial difficulties   Traumatic event    Patient Strengths: Capable of independent living  Special hobby/interest   Treatment Modalities: Medication Management, Group therapy, Case management,  1 to 1 session with clinician, Psychoeducation, Recreational therapy.   Physician Treatment Plan for Primary Diagnosis: Schizoaffective disorder, depressive type (HCC) Long Term Goal(s): Improvement in symptoms so as ready for discharge   Short Term Goals: Ability to identify changes in lifestyle to reduce recurrence of condition will improve Ability to verbalize feelings will improve Ability to disclose and discuss suicidal ideas Ability to demonstrate self-control will improve Ability to identify and develop effective coping behaviors will improve Ability to maintain clinical measurements within normal limits will improve Compliance with prescribed medications will improve  Medication Management: Evaluate patient's response, side effects, and tolerance of medication regimen.  Therapeutic Interventions: 1 to 1 sessions, Unit Group sessions and Medication administration.  Evaluation of Outcomes: Progressing  Physician Treatment Plan for Secondary Diagnosis: Principal Problem:   Schizoaffective disorder, depressive type (HCC) Active Problems:   Borderline personality disorder (HCC)   Asperger syndrome   Suicidal ideation   PTSD (post-traumatic stress disorder)   Fibromyalgia  Long Term Goal(s): Improvement in symptoms so as ready for discharge   Short Term Goals: Ability to identify changes in lifestyle  to reduce recurrence of condition will improve Ability to verbalize feelings will improve Ability to disclose and discuss suicidal ideas Ability to demonstrate self-control will improve Ability to identify and develop effective coping behaviors will improve Ability to  maintain clinical measurements within normal limits will improve Compliance with prescribed medications will improve     Medication Management: Evaluate patient's response, side effects, and tolerance of medication regimen.  Therapeutic Interventions: 1 to 1 sessions, Unit Group sessions and Medication administration.  Evaluation of Outcomes: Progressing   RN Treatment Plan for Primary Diagnosis: Schizoaffective disorder, depressive type (HCC) Long Term Goal(s): Knowledge of disease and therapeutic regimen to maintain health will improve  Short Term Goals: Ability to remain free from injury will improve, Ability to verbalize frustration and anger appropriately will improve, Ability to demonstrate self-control, Ability to participate in decision making will improve, Ability to verbalize feelings will improve, Ability to disclose and discuss suicidal ideas, Ability to identify and develop effective coping behaviors will improve, and Compliance with prescribed medications will improve  Medication Management: RN will administer medications as ordered by provider, will assess and evaluate patient's response and provide education to patient for prescribed medication. RN will report any adverse and/or side effects to prescribing provider.  Therapeutic Interventions: 1 on 1 counseling sessions, Psychoeducation, Medication administration, Evaluate responses to treatment, Monitor vital signs and CBGs as ordered, Perform/monitor CIWA, COWS, AIMS and Fall Risk screenings as ordered, Perform wound care treatments as ordered.  Evaluation of Outcomes: Progressing   LCSW Treatment Plan for Primary Diagnosis: Schizoaffective disorder, depressive type (HCC) Long Term Goal(s): Safe transition to appropriate next level of care at discharge, Engage patient in therapeutic group addressing interpersonal concerns.  Short Term Goals: Engage patient in aftercare planning with referrals and resources, Increase social  support, Increase ability to appropriately verbalize feelings, Increase emotional regulation, Facilitate acceptance of mental health diagnosis and concerns, Facilitate patient progression through stages of change regarding substance use diagnoses and concerns, Identify triggers associated with mental health/substance abuse issues, and Increase skills for wellness and recovery  Therapeutic Interventions: Assess for all discharge needs, 1 to 1 time with Social worker, Explore available resources and support systems, Assess for adequacy in community support network, Educate family and significant other(s) on suicide prevention, Complete Psychosocial Assessment, Interpersonal group therapy.  Evaluation of Outcomes: Progressing   Progress in Treatment: Attending groups: Yes. Participating in groups: Yes. Taking medication as prescribed: Yes. Toleration medication: Yes. Family/Significant other contact made: Yes, individual(s) contacted:  [facility] ACTT/CST team. Patient understands diagnosis: Yes. Discussing patient identified problems/goals with staff: Yes. Medical problems stabilized or resolved: Yes. Denies suicidal/homicidal ideation: Yes. Issues/concerns per patient self-inventory: Yes.  New problem(s) identified: Yes, Describe:  none reported  New Short Term/Long Term Goal(s):   medication stabilization, elimination of SI thoughts, development of comprehensive mental wellness plan.    Patient Goals:  "I want to leave today"  Discharge Plan or Barriers: patient will follow up with [facility] ACTT/CST  Reason for Continuation of Hospitalization: Anxiety Depression Medication stabilization Suicidal ideation  Estimated Length of Stay:  0 days  Last 3 [place] Suicide Severity Risk Score: Flowsheet Row [facility] Most recent reading at 07/06/2022  2:45 PM ED from 07/06/2022 in [facility] Most recent reading at 07/06/2022  3:15 AM ED from 06/28/2022 in [facility] at [facility] Most recent reading at 06/29/2022 12:22 AM  C-SSRS RISK CATEGORY High Risk  Moderate Risk Error: Q3, 4, or 5 should not be populated when Q2 is No       Last PHQ 2/9 Scores:    03/10/2022   10:01 PM 03/10/2022    9:56 PM 02/10/2022    1:49 AM  Depression screen PHQ 2/9  Decreased Interest 1 1 2   Down, Depressed, Hopeless 1 1 2   PHQ - 2 Score 2 2 4   Altered sleeping 1 1 2   Tired, decreased energy 1 1 2   Change in appetite 1 0 2  Feeling bad or failure about yourself  1 1 2   Trouble concentrating 1 1 2   Moving slowly or fidgety/restless 1 1 1   Suicidal thoughts 1 1 1   PHQ-9 Score 9 8 16   Difficult doing work/chores Somewhat difficult  Very difficult    Scribe for Treatment Team: Beatris Si, LCSW 07/09/2022 4:18 PM

## 2022-07-09 NOTE — Progress Notes (Signed)
Adult Psychoeducational Group Note  Date:  07/09/2022 Time:  12:16 AM  Group Topic/Focus:  Wrap-Up Group:   The focus of this group is to help patients review their daily goal of treatment and discuss progress on daily workbooks.  Participation Level:  Active  Participation Quality:  Appropriate  Affect:  Appropriate  Cognitive:  Appropriate  Insight: Appropriate  Engagement in Group:  Engaged  Modes of Intervention:  Discussion and Support  Additional Comments:  Pt states goal today was to get off a 1:1. Pt rates day a 10/10 and plans on leaving tomorrow. Pt states coming to [facility] to be in a safe place while trying to figure out life stressors.  Lateka Rady Katrinka Blazing 07/09/2022, 12:16 AM

## 2022-07-09 NOTE — Progress Notes (Addendum)
  [facility] Adult Case Management Discharge Plan :  Will you be returning to the same living situation after discharge:  Yes,  Patient will be returning back to her apartment At discharge, do you have transportation home?: Yes: patient said that she is arranging her own uber  Do you have the ability to pay for your medications: Yes,  [employer] Medicare Choice PPO  Release of information consent forms completed and in the chart;  Patient's signature needed at discharge.  Patient to Follow up at:  Follow-up Information     [facility]. Call on 07/11/2022.   Why: One of your providers, Lupita Leash will be coming out to see you early in the morning. If you have any questions or concerns , please call Lupita Leash at [phone] Contact information: [address]  [address] [place] [address] [phone]                 Next level of care provider has access to [facility] Link:no  Safety Planning and Suicide Prevention discussed: Yes,  Lupita Leash [phone]      Has patient been referred to the Quitline?: Patient refused referral for treatment  Patient has been referred for addiction treatment: No known substance use disorder.  Isabella Bowens, LCSWA 07/09/2022, 9:38 AM
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
## 2022-07-09 NOTE — Progress Notes (Signed)
   07/09/22 0100  Psych Admission Type (Psych Patients Only)  Admission Status Voluntary  Date 72 hour document signed  07/08/22  Time 72 hour document signed  1541  Psychosocial Assessment  Patient Complaints Anxiety;Depression  Eye Contact Fair  Facial Expression Anxious;Animated  Affect Appropriate to circumstance  Speech Logical/coherent;Loud  Interaction Assertive  Motor Activity Fidgety  Appearance/Hygiene Unremarkable  Behavior Characteristics Cooperative  Mood Depressed;Anxious  Thought Process  Coherency WDL  Content Preoccupation  Delusions None reported or observed  Perception WDL  Hallucination None reported or observed  Judgment Poor  Confusion None  Danger to Self  Current suicidal ideation? Denies  Self-Injurious Behavior No self-injurious ideation or behavior indicators observed or expressed   Agreement Not to Harm Self Yes  Description of Agreement pt verbally contracts for safety  Danger to Others  Danger to Others None reported or observed  Danger to Others Abnormal  Harmful Behavior to others No threats or harm toward other people  Destructive Behavior No threats or harm toward property

## 2022-07-09 NOTE — Discharge Summary (Signed)
Physician Discharge Summary Note  Patient:  [name] is an 32 y.o., female MRN:  [id number] DOB:  1991/02/03 Patient phone:  [phone] (home)  Patient address:   [address] [place] [address],  Total Time spent with patient: 20 minutes  Date of Admission:  07/06/2022 Date of Discharge: 07-09-2022  Reason for Admission:   [name] is a 32 yo female with past psychiatric history of PTSD, borderline personality d/o & schizoaffective d/o who was admitted voluntarily for SI with a plan to slice her throat in the setting of SSI not being accepted for recertification.      Principal Problem: Schizoaffective disorder, depressive type [facility]) Discharge Diagnoses: Principal Problem:   Schizoaffective disorder, depressive type (HCC) Active Problems:   Borderline personality disorder (HCC)   Asperger syndrome   Suicidal ideation   PTSD (post-traumatic stress disorder)   Fibromyalgia   Past Psychiatric History:  Previous Psych Diagnoses: Schizoaffective disorder bipolar type, Anxiety, Autism spectrum disorder, PTSD, Borderline personality disorder  Prior inpatient treatment: Multiple. This is the 9th [facility] admission within a 12 month period. Outpatient treatment: has ACT team   Prior rehab hx: Denies History of suicide: multiple attempts mostly via overdose on her prescription medications. Most is this admission. History of homicide: Denies Psychiatric medication history: Previously trialed Tegretol, Haldol, Clozaril, Depakote, Invega, and Lamictal  Psychiatric medication compliance history: Poor   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 fragile X syndrome    Chronic constipation    Depression    Drug-seeking behavior    Essential tremor    Headache    Overdose of acetaminophen 07/2017   and other meds   Personality disorder  (HCC)    Schizo-affective psychosis (HCC)    Schizoaffective disorder, bipolar type (HCC)    Seizures (HCC)    Last seizure December 2017   Sleep apnea     Past Surgical History:  Procedure Laterality Date   MOUTH SURGERY  2009 or 2010   Family History:  Family History  Problem Relation Age of Onset   Mental illness Father    Asthma Father    PDD Brother    Seizures Brother    Family Psychiatric  History: See H&P   Social History:  Social History   Substance and Sexual Activity  Alcohol Use No   Alcohol/week: 1.0 standard drink of alcohol   Types: 1 Standard drinks or equivalent per week   Comment: denies at this time     Social History   Substance and Sexual Activity  Drug Use No   Comment: History of cocaine use at age 57 for 4 months    Social History   Socioeconomic History   Marital status: Widowed    Spouse name: Not on file   Number of children: 0   Years of education: Not on file   Highest education level: Not on file  Occupational History   Occupation: disability  Tobacco Use   Smoking status: Former    Packs/day: 0    Types: Cigarettes   Smokeless tobacco: Never   Tobacco comments:    Smoked for 2  years age [age]  Vaping Use   Vaping Use: Never used  Substance and Sexual Activity   Alcohol use: No    Alcohol/week: 1.0 standard drink  of alcohol    Types: 1 Standard drinks or equivalent per week    Comment: denies at this time   Drug use: No    Comment: History of cocaine use at age 32 for 4 months   Sexual activity: Not Currently    Birth control/protection: None  Other Topics Concern   Not on file  Social History Narrative   Marital status: Widowed      Children: daughter      Lives: with boyfriend, in two story home      Employment:  Disability      Tobacco: quit smoking; smoked for two years.      Alcohol ;none      Drugs: none   Has not traveled outside of the country.   Right handed         Social Determinants of Health    Financial Resource Strain: Not on file  Food Insecurity: No Food Insecurity (07/06/2022)   Hunger Vital Sign    Worried About Running Out of Food in the Last Year: Never true    Ran Out of Food in the Last Year: Never true  Transportation Needs: No Transportation Needs (07/06/2022)   PRAPARE - Administrator, Civil Service (Medical): No    Lack of Transportation (Non-Medical): No  Recent Concern: Transportation Needs - Unmet Transportation Needs (05/06/2022)   PRAPARE - Administrator, Civil Service (Medical): Yes    Lack of Transportation (Non-Medical): Yes  Physical Activity: Not on file  Stress: Not on file  Social Connections: Not on file    Hospital Course:  During the patient's hospitalization, patient had extensive initial psychiatric evaluation, and follow-up psychiatric evaluations every day.  Psychiatric diagnoses provided upon initial assessment:  # Schizoaffective disorder, depressive type #PTSD #Borderline personality disorder  Patient's psychiatric medications were adjusted on admission:  -Continue Lexapro 20 mg daily - Continue  Zyprexa 10 mg nightly -Start Cogentin 1 mg nightly for EPS - Continue prazosin 1 mg nightly -Continue melatonin 3 mg nightly  During the hospitalization, other adjustments were made to the patient's psychiatric medication regimen: none   Patient's care was discussed during the interdisciplinary team meeting every day during the hospitalization.  The patient reported jaw clenching which resolved with starting cogentin. She otherwise denied having side effects to prescribed psychiatric medication.  Gradually, patient started adjusting to milieu. The patient was evaluated each day by a clinical provider to ascertain response to treatment. Improvement was noted by the patient's report of decreasing symptoms, improved sleep and appetite, affect, medication tolerance, behavior, and participation in unit programming.   Patient was asked each day to complete a self inventory noting mood, mental status, pain, new symptoms, anxiety and concerns.    Symptoms were reported as significantly decreased or resolved completely by discharge.   On day of discharge, the patient reports that their mood is stable. The patient denied having suicidal thoughts for more than 48 hours prior to discharge.  Patient denies having homicidal thoughts.  Patient denies having auditory hallucinations.  Patient denies any visual hallucinations or other symptoms of psychosis. The patient was motivated to continue taking medication with a goal of continued improvement in mental health.   The patient reports their target psychiatric symptoms of suicidal thoughts responded well to the psychiatric medications, and the patient reports overall benefit other psychiatric hospitalization. Supportive psychotherapy was provided to the patient. The patient also participated in regular group therapy while hospitalized. Coping skills, problem solving  as well as relaxation therapies were also part of the unit programming.  Labs were reviewed with the patient, and abnormal results were discussed with the patient.  The patient is able to verbalize their individual safety plan to this provider.  # It is recommended to the patient to continue psychiatric medications as prescribed, after discharge from the hospital.    # It is recommended to the patient to follow up with your outpatient psychiatric provider and PCP.  # It was discussed with the patient, the impact of alcohol, drugs, tobacco have been there overall psychiatric and medical wellbeing, and total abstinence from substance use was recommended the patient.ed.  # Prescriptions provided or sent directly to preferred pharmacy at discharge. Patient agreeable to plan. Given opportunity to ask questions. Appears to feel comfortable with discharge.    # In the event of worsening symptoms, the patient is  instructed to call the crisis hotline, 911 and or go to the nearest ED for appropriate evaluation and treatment of symptoms. To follow-up with primary care provider for other medical issues, concerns and or health care needs  # Patient was discharged home with CST team, with a plan to follow up as noted below.    Physical Findings: AIMS:  , ,  ,  ,    CIWA:    COWS:     Aims score zero on my exam. No eps on my exam.  Musculoskeletal: Strength & Muscle Tone: within normal limits Gait & Station: normal Patient leans: N/A   Psychiatric Specialty Exam:  Presentation  General Appearance:  Appropriate for Environment; Casual; Fairly Groomed  Eye Contact: Good  Speech: Normal Rate; Clear and Coherent  Speech Volume: Normal  Handedness: Right   Mood and Affect  Mood: Euthymic  Affect: Appropriate; Congruent; Full Range   Thought Process  Thought Processes: Linear  Descriptions of Associations:Intact  Orientation:Full (Time, Place and Person)  Thought Content:Logical  History of Schizophrenia/Schizoaffective disorder:No  Duration of Psychotic Symptoms:Greater than six months  Hallucinations:Hallucinations: None  Ideas of Reference:None  Suicidal Thoughts:Suicidal Thoughts: No  Homicidal Thoughts:Homicidal Thoughts: No   Sensorium  Memory: Immediate Good; Recent Good; Remote Good  Judgment: Fair  Insight: Fair   Art therapist  Concentration: Fair  Attention Span: Fair  Recall: Good  Fund of Knowledge: Good  Language: Good   Psychomotor Activity  Psychomotor Activity: Psychomotor Activity: Normal   Assets  Assets: Communication Skills; Desire for Improvement; Housing; Leisure Time   Sleep  Sleep: Sleep: Fair    Physical Exam: Physical Exam Vitals reviewed.  Pulmonary:     Effort: Pulmonary effort is normal.  Neurological:     Mental Status: She is alert.     Motor: No weakness.     Gait: Gait normal.   Psychiatric:        Mood and Affect: Mood normal.        Behavior: Behavior normal.        Thought Content: Thought content normal.        Judgment: Judgment normal.    Review of Systems  Constitutional:  Negative for chills and fever.  Cardiovascular:  Negative for chest pain and palpitations.  Neurological:  Negative for dizziness, tingling, tremors and headaches.  Psychiatric/Behavioral:  Negative for depression, hallucinations, memory loss, substance abuse and suicidal ideas. The patient is not nervous/anxious and does not have insomnia.   All other systems reviewed and are negative.  Blood pressure 126/85, pulse (!) 114, temperature 98.2 F (36.8 C), temperature  source Oral, resp. rate 16, height 5\' 4"  (1.626 m), weight 95.3 kg, SpO2 100 %. Body mass index is 36.05 kg/m.   Social History   Tobacco Use  Smoking Status Former   Packs/day: 0   Types: Cigarettes  Smokeless Tobacco Never  Tobacco Comments   Smoked for 2  years age [age]   Tobacco Cessation:  N/A, patient does not currently use tobacco products   Blood Alcohol level:  Lab Results  Component Value Date   ETH <10 06/22/2022   ETH <10 06/16/2022    Metabolic Disorder Labs:  Lab Results  Component Value Date   HGBA1C 5.2 04/20/2022   MPG 103 04/20/2022   MPG 96.8 10/07/2021   Lab Results  Component Value Date   PROLACTIN 66.2 (H) 03/02/2021   PROLACTIN 66.1 (H) 02/04/2020   Lab Results  Component Value Date   CHOL 191 04/20/2022   TRIG 163 (H) 04/20/2022   HDL 52 04/20/2022   CHOLHDL 3.7 04/20/2022   VLDL 33 04/20/2022   LDLCALC 106 (H) 04/20/2022   LDLCALC 102 (H) 12/10/2021    See Psychiatric Specialty Exam and Suicide Risk Assessment completed by Attending Physician prior to discharge.  Discharge destination:  Home  Is patient on multiple antipsychotic therapies at discharge:  No   Has Patient had three or more failed trials of antipsychotic monotherapy by history:  No  Recommended  Plan for Multiple Antipsychotic Therapies: NA  Discharge Instructions     Diet - low sodium heart healthy   Complete by: As directed    Increase activity slowly   Complete by: As directed       Allergies as of 07/09/2022       Reactions   Bee Venom Anaphylaxis   Coconut Flavor Anaphylaxis, Rash   Fish Allergy Anaphylaxis   Geodon [ziprasidone Hcl] Other (See Comments)   Pt states that this medication causes paralysis of the mouth.     Haloperidol And Related Other (See Comments)   Pt states that this medication causes paralysis of the mouth, jaw locks up   Lithobid [lithium] Other (See Comments)   Seizure-like activity   Roxicodone [oxycodone] Other (See Comments)   Hallucinations    Seroquel [quetiapine] Other (See Comments)   Severe drowsiness Pt currently taking 12.5 mg BID, she is ok with this dose   Shellfish Allergy Anaphylaxis   Phenergan [promethazine Hcl] Other (See Comments)   Chest pain    Prilosec [omeprazole] Nausea And Vomiting   Pt can take protonix with no problems   Sulfa Antibiotics Other (See Comments)   Chest pain    Tegretol [carbamazepine] Nausea And Vomiting   Prozac [fluoxetine] Other (See Comments)   Increased Depression Suicidal thoughts   Tape Other (See Comments)   Skin tears, can only tolerate paper tape.   Tylenol [acetaminophen] Rash, Other (See Comments)   Rash on face         Medication List     TAKE these medications      Indication  albuterol 108 (90 Base) MCG/ACT inhaler Commonly known as: VENTOLIN HFA Inhale 2 puffs into the lungs every 6 (six) hours as needed for wheezing or shortness of breath.  Indication: Asthma   benztropine 1 MG tablet Commonly known as: COGENTIN Take 1 tablet (1 mg total) by mouth at bedtime for 7 doses.  Indication: Extrapyramidal Reaction caused by Medications   Breyna 160-4.5 MCG/ACT inhaler Generic drug: budesonide-formoterol Inhale 1 puff into the lungs in the morning and  at bedtime.   Indication: Asthma   cyclobenzaprine 10 MG tablet Commonly known as: FLEXERIL Take 10 mg by mouth at bedtime.  Indication: Fibromyalgia Syndrome   diclofenac Sodium 1 % Gel Commonly known as: VOLTAREN Apply 2 g topically 2 (two) times daily.  Indication: Joint Damage causing Pain and Loss of Function   escitalopram 20 MG tablet Commonly known as: LEXAPRO Take 1 tablet (20 mg total) by mouth daily.  Indication: Generalized Anxiety Disorder   gabapentin 600 MG tablet Commonly known as: NEURONTIN Take 600 mg by mouth 3 (three) times daily.  Indication: Generalized Anxiety Disorder   hydrOXYzine 25 MG tablet Commonly known as: ATARAX Take 1 tablet (25 mg total) by mouth 3 (three) times daily as needed for anxiety.  Indication: Feeling Anxious   melatonin 3 MG Tabs tablet Take 1 tablet (3 mg total) by mouth at bedtime.  Indication: Trouble Sleeping   OLANZapine zydis 10 MG disintegrating tablet Commonly known as: ZYPREXA Take 1 tablet (10 mg total) by mouth at bedtime.  Indication: Manic Phase of Manic-Depression   pantoprazole 40 MG tablet Commonly known as: PROTONIX Take 40 mg by mouth daily.  Indication: Heartburn   prazosin 1 MG capsule Commonly known as: MINIPRESS Take 1 mg by mouth at bedtime.  Indication: Frightening Dreams   traZODone 50 MG tablet Commonly known as: DESYREL Take 1 tablet (50 mg total) by mouth at bedtime as needed for up to 7 days for sleep. What changed: when to take this  Indication: Trouble Sleeping         Follow-up recommendations:    Activity: as tolerated  Diet: heart healthy  Other: -Follow-up with your outpatient psychiatric provider -instructions on appointment date, time, and address (location) are provided to you in discharge paperwork.  -Take your psychiatric medications as prescribed at discharge - instructions are provided to you in the discharge paperwork  -Follow-up with outpatient primary care doctor and other  specialists -for management of preventative medicine and chronic medical disease  -If you are prescribed an atypical antipsychotic medication, we recommend that your outpatient psychiatrist follow routine screening for side effects within 3 months of discharge, including monitoring: AIMS scale, height, weight, blood pressure, fasting lipid panel, HbA1c, and fasting blood sugar.   -Recommend total abstinence from alcohol, tobacco, and other illicit drug use at discharge.   -If your psychiatric symptoms recur, worsen, or if you have side effects to your psychiatric medications, call your outpatient psychiatric provider, 911, [phone] or go to the nearest emergency department.  -If suicidal thoughts occur, immediately call your outpatient psychiatric provider, 911, [phone] or go to the nearest emergency department.    Signed: Cristy Hilts, MD 07/09/2022, 8:42 AM   Total Time Spent in Direct Patient Care:  I personally spent 35 minutes on the unit in direct patient care. The direct patient care time included face-to-face time with the patient, reviewing the patient's chart, communicating with other professionals, and coordinating care. Greater than 50% of this time was spent in counseling or coordinating care with the patient regarding goals of hospitalization, psycho-education, and discharge planning needs.   Phineas Inches, MD Psychiatrist

## 2022-07-13 ENCOUNTER — Other Ambulatory Visit: Payer: Self-pay

## 2022-07-13 ENCOUNTER — Encounter (HOSPITAL_COMMUNITY): Payer: Self-pay

## 2022-07-13 ENCOUNTER — Emergency Department (HOSPITAL_COMMUNITY)
Admission: EM | Admit: 2022-07-13 | Discharge: 2022-07-14 | Disposition: A | Discharge status: Other Acute Inpatient Hospital | Payer: Medicare PPO | Attending: Emergency Medicine | Admitting: Emergency Medicine

## 2022-07-13 DIAGNOSIS — J45909 Unspecified asthma, uncomplicated: Secondary | ICD-10-CM | POA: Diagnosis not present

## 2022-07-13 DIAGNOSIS — F315 Bipolar disorder, current episode depressed, severe, with psychotic features: Secondary | ICD-10-CM | POA: Insufficient documentation

## 2022-07-13 DIAGNOSIS — T1491XA Suicide attempt, initial encounter: Secondary | ICD-10-CM | POA: Diagnosis not present

## 2022-07-13 DIAGNOSIS — T46992A Poisoning by other agents primarily affecting the cardiovascular system, intentional self-harm, initial encounter: Secondary | ICD-10-CM | POA: Diagnosis not present

## 2022-07-13 DIAGNOSIS — F840 Autistic disorder: Secondary | ICD-10-CM | POA: Diagnosis not present

## 2022-07-13 DIAGNOSIS — T426X2A Poisoning by other antiepileptic and sedative-hypnotic drugs, intentional self-harm, initial encounter: Secondary | ICD-10-CM | POA: Insufficient documentation

## 2022-07-13 DIAGNOSIS — Z87891 Personal history of nicotine dependence: Secondary | ICD-10-CM | POA: Diagnosis not present

## 2022-07-13 DIAGNOSIS — F319 Bipolar disorder, unspecified: Secondary | ICD-10-CM | POA: Diagnosis present

## 2022-07-13 DIAGNOSIS — F250 Schizoaffective disorder, bipolar type: Secondary | ICD-10-CM | POA: Diagnosis present

## 2022-07-13 DIAGNOSIS — T50902A Poisoning by unspecified drugs, medicaments and biological substances, intentional self-harm, initial encounter: Secondary | ICD-10-CM | POA: Diagnosis present

## 2022-07-13 LAB — COMPREHENSIVE METABOLIC PANEL
ALT: 14 U/L (ref 0–44)
AST: 18 U/L (ref 15–41)
Albumin: 3.6 g/dL (ref 3.5–5.0)
Alkaline Phosphatase: 61 U/L (ref 38–126)
Anion gap: 9 (ref 5–15)
BUN: 7 mg/dL (ref 6–20)
CO2: 22 mmol/L (ref 22–32)
Calcium: 8.8 mg/dL — ABNORMAL LOW (ref 8.9–10.3)
Chloride: 105 mmol/L (ref 98–111)
Creatinine, Ser: 0.73 mg/dL (ref 0.44–1.00)
GFR, Estimated: >60 mL/min (ref >60)
Glucose, Bld: 133 mg/dL — ABNORMAL HIGH (ref 70–99)
Potassium: 3.6 mmol/L (ref 3.5–5.1)
Sodium: 136 mmol/L (ref 135–145)
Total Bilirubin: 0.2 mg/dL — ABNORMAL LOW (ref 0.3–1.2)
Total Protein: 7 g/dL (ref 6.5–8.1)

## 2022-07-13 LAB — RAPID URINE DRUG SCREEN, HOSP PERFORMED
Amphetamines: NOT DETECTED
Barbiturates: NOT DETECTED
Benzodiazepines: NOT DETECTED
Cocaine: NOT DETECTED
Opiates: NOT DETECTED
Tetrahydrocannabinol: NOT DETECTED

## 2022-07-13 LAB — CBC
HCT: 34.7 % — ABNORMAL LOW (ref 36.0–46.0)
Hemoglobin: 10.7 g/dL — ABNORMAL LOW (ref 12.0–15.0)
MCH: 24.7 pg — ABNORMAL LOW (ref 26.0–34.0)
MCHC: 30.8 g/dL (ref 30.0–36.0)
MCV: 80.1 fL (ref 80.0–100.0)
Platelets: 278 10*3/uL (ref 150–400)
RBC: 4.33 MIL/uL (ref 3.87–5.11)
RDW: 14.3 % (ref 11.5–15.5)
WBC: 9.8 10*3/uL (ref 4.0–10.5)
nRBC: 0 % (ref 0.0–0.2)

## 2022-07-13 LAB — SALICYLATE LEVEL: Salicylate Lvl: <7 mg/dL — ABNORMAL LOW (ref 7.0–30.0)

## 2022-07-13 LAB — I-STAT BETA HCG BLOOD, ED (MC, WL, AP ONLY): I-stat hCG, quantitative: <5 m[IU]/mL (ref <5)

## 2022-07-13 LAB — ACETAMINOPHEN LEVEL: Acetaminophen (Tylenol), Serum: <10 ug/mL — ABNORMAL LOW (ref 10–30)

## 2022-07-13 LAB — ETHANOL: Alcohol, Ethyl (B): <10 mg/dL (ref <10)

## 2022-07-13 NOTE — ED Triage Notes (Addendum)
Pt BIB EMS from home with reports of ingestion (Gabapentin 600 mg 12 tabs ) (Corlanor 5 mg 8 tabs) at 730 pm. Pt states that this was intentional and she is suicidal. Pt seen her psychiatrist today but they did not help her. Pt complains of nausea and headache.

## 2022-07-13 NOTE — ED Provider Notes (Signed)
[place] EMERGENCY DEPARTMENT AT [facility] Provider Note   CSN: [id number] Arrival date & time: 07/13/22  2015     History  Chief Complaint  Patient presents with   Ingestion   Suicidal    [name] is a 32 y.o. female history of borderline personality disorder, Asperger, schizoaffective, bipolar, seizure disorder, suicide attempt here for evaluation of suicide attempt.  7:30 PM took 12 tablets gabapentin, 600 mg, Corlanor 5 mg 8 tabs and intent to harm herself.  States she has some nausea.  No pain.  Denies chance of pregnancy.  States she has been inpatient for SI previously.  She denies any recent self cutting.  No fever, headache, chest pain, shortness of breath, abdominal pain, diarrhea or dysuria.  Denies any illicit substance use, EtOH use.  Denies HI, AVH  States she was seen by her therapist today.  Supposed see psychiatrist next week, no recent medication changes states she has been compliant with her home medications  HPI     Home Medications Prior to Admission medications   Medication Sig Start Date End Date Taking? Authorizing Provider  albuterol (VENTOLIN HFA) 108 (90 Base) MCG/ACT inhaler Inhale 2 puffs into the lungs every 6 (six) hours as needed for wheezing or shortness of breath.    [provider]  benztropine (COGENTIN) 1 MG tablet Take 1 tablet (1 mg total) by mouth at bedtime for 7 doses. 07/09/22 07/16/22  Massengill, Harrold Donath, MD  BREYNA 160-4.5 MCG/ACT inhaler Inhale 1 puff into the lungs in the morning and at bedtime. 04/18/22   [provider]  cyclobenzaprine (FLEXERIL) 10 MG tablet Take 10 mg by mouth at bedtime. 05/10/22   [provider]  diclofenac Sodium (VOLTAREN) 1 % GEL Apply 2 g topically 2 (two) times daily. 06/11/22   Starleen Blue, NP  escitalopram (LEXAPRO) 20 MG tablet Take 1 tablet (20 mg total) by mouth daily. 06/24/22   White, Patrice L, NP  gabapentin (NEURONTIN) 600 MG tablet Take 600 mg by  mouth 3 (three) times daily. 06/29/22   [provider]  hydrOXYzine (ATARAX) 25 MG tablet Take 1 tablet (25 mg total) by mouth 3 (three) times daily as needed for anxiety. 06/11/22   Starleen Blue, NP  melatonin 3 MG TABS tablet Take 1 tablet (3 mg total) by mouth at bedtime. 06/11/22   Starleen Blue, NP  OLANZapine zydis (ZYPREXA) 10 MG disintegrating tablet Take 1 tablet (10 mg total) by mouth at bedtime. 06/23/22   White, Patrice L, NP  pantoprazole (PROTONIX) 40 MG tablet Take 40 mg by mouth daily.    [provider]  prazosin (MINIPRESS) 1 MG capsule Take 1 mg by mouth at bedtime.    [provider]  traZODone (DESYREL) 50 MG tablet Take 1 tablet (50 mg total) by mouth at bedtime as needed for up to 7 days for sleep. Patient taking differently: Take 50 mg by mouth at bedtime. 04/30/22 07/06/22  Massengill, Harrold Donath, MD      Allergies    Bee venom, Coconut flavor, Fish allergy, Geodon [ziprasidone hcl], Haloperidol and related, Lithobid [lithium], Roxicodone [oxycodone], Seroquel [quetiapine], Shellfish allergy, Phenergan [promethazine hcl], Prilosec [omeprazole], Sulfa antibiotics, Tegretol [carbamazepine], Prozac [fluoxetine], Tape, and Tylenol [acetaminophen]    Review of Systems   Review of Systems  Constitutional: Negative.   HENT: Negative.    Cardiovascular: Negative.   Gastrointestinal:  Positive for nausea. Negative for abdominal distention, abdominal pain, anal bleeding, blood in stool, constipation, diarrhea, rectal  pain and vomiting.  Genitourinary: Negative.   Musculoskeletal: Negative.   Skin: Negative.   Neurological: Negative.   Psychiatric/Behavioral:  Positive for self-injury and suicidal ideas. Negative for sleep disturbance. The patient is nervous/anxious.   All other systems reviewed and are negative.   Physical Exam Updated Vital Signs BP 135/67 (BP Location: Left Arm)   Pulse (!) 112   Temp 98.2 F (36.8 C) (Oral)   Resp 18   Ht 5\' 4"   (1.626 m)   Wt 95.3 kg   SpO2 100%   BMI 36.06 kg/m  Physical Exam Vitals and nursing note reviewed.  Constitutional:      General: She is not in acute distress.    Appearance: She is well-developed. She is not ill-appearing, toxic-appearing or diaphoretic.  HENT:     Head: Atraumatic.  Eyes:     Pupils: Pupils are equal, round, and reactive to light.  Cardiovascular:     Rate and Rhythm: Tachycardia present.     Pulses: Normal pulses.     Heart sounds: Normal heart sounds.  Pulmonary:     Effort: Pulmonary effort is normal. No respiratory distress.  Abdominal:     General: Bowel sounds are normal. There is no distension.     Palpations: Abdomen is soft.  Musculoskeletal:        General: Normal range of motion.     Cervical back: Normal range of motion.  Skin:    General: Skin is warm and dry.     Capillary Refill: Capillary refill takes less than 2 seconds.  Neurological:     General: No focal deficit present.     Mental Status: She is alert.  Psychiatric:        Attention and Perception: Attention and perception normal.        Mood and Affect: Mood is anxious.        Behavior: Behavior is hyperactive.        Thought Content: Thought content is not paranoid or delusional. Thought content includes suicidal ideation. Thought content does not include homicidal ideation. Thought content includes suicidal plan. Thought content does not include homicidal plan.     Comments: Appears anxious     ED Results / Procedures / Treatments   Labs (all labs ordered are listed, but only abnormal results are displayed) Labs Reviewed  COMPREHENSIVE METABOLIC PANEL - Abnormal; Notable for the following components:      Result Value   Glucose, Bld 133 (*)    Calcium 8.8 (*)    Total Bilirubin 0.2 (*)    All other components within normal limits  SALICYLATE LEVEL - Abnormal; Notable for the following components:   Salicylate Lvl <7.0 (*)    All other components within normal limits   ACETAMINOPHEN LEVEL - Abnormal; Notable for the following components:   Acetaminophen (Tylenol), Serum <10 (*)    All other components within normal limits  CBC - Abnormal; Notable for the following components:   Hemoglobin 10.7 (*)    HCT 34.7 (*)    MCH 24.7 (*)    All other components within normal limits  ETHANOL  RAPID URINE DRUG SCREEN, HOSP PERFORMED  ACETAMINOPHEN LEVEL  I-STAT BETA HCG BLOOD, ED ([facility], [facility], [facility] ONLY)    EKG None  Radiology No results found.  Procedures Procedures    Medications Ordered in ED Medications - No data to display  ED Course/ Medical Decision Making/ A&P   32 year old with extensive psychiatric history here for evaluation of  intentional overdose on gabapentin and Corlanor.  She appears otherwise well aside from some nausea.  No hypotension, bradycardia.  Hemodynamically stable.  Plan touching base with poison control, labs and reassess  Nursing has discussed with poison control.  Patient at highest risk for arrhythmias, hypotension due to heart failure medications.  Recommend observe for 8 hours, repeat Tylenol at 4 hours, EKG at 4 and 8 hours, after this patient to be medically cleared  Labs personally viewed and interpreted:  CBC without leukocytosis, hemoglobin 10.8 Metabolic panel without significant abnormality Pregnancy test negative Acetaminophen, salicylate, ethanol negative Preg tested negative EKG without changes  Repeat acetaminophen at 0000 Obs till 0400  Once cleared medically plan on Psych consult.  Care transferred to [facility], PA-C who will follow-up on repeat testing, medical observation until 0400.                            Medical Decision Making Amount and/or Complexity of Data Reviewed Independent Historian: EMS External Data Reviewed: labs, ECG and notes. Labs: ordered. Decision-making details documented in ED Course. ECG/medicine tests: ordered and independent interpretation performed. Decision-making  details documented in ED Course.  Risk OTC drugs. Prescription drug management. Parenteral controlled substances. Diagnosis or treatment significantly limited by social determinants of health.          Final Clinical Impression(s) / ED Diagnoses Final diagnoses:  Suicide attempt [facility])  Intentional overdose, initial encounter [facility])    Rx / DC Orders ED Discharge Orders     None         Ezel Vallone A, PA-C 07/13/22 2306    Arby Barrette, MD 07/13/22 2336

## 2022-07-14 DIAGNOSIS — T1491XA Suicide attempt, initial encounter: Secondary | ICD-10-CM | POA: Diagnosis not present

## 2022-07-14 LAB — ACETAMINOPHEN LEVEL: Acetaminophen (Tylenol), Serum: <10 ug/mL — ABNORMAL LOW (ref 10–30)

## 2022-07-14 MED ORDER — BENZTROPINE MESYLATE 1 MG PO TABS: 1.0000 mg | ORAL_TABLET | Freq: Every day | ORAL | Status: DC

## 2022-07-14 MED ORDER — OLANZAPINE 10 MG PO TABS: 10.0000 mg | ORAL_TABLET | Freq: Every day | ORAL | Status: DC

## 2022-07-14 MED ORDER — ESCITALOPRAM OXALATE 10 MG PO TABS
20.0000 mg | ORAL_TABLET | Freq: Every day | ORAL | Status: DC
  Administered 2022-07-14: 20 mg via ORAL
  Filled 2022-07-14: qty 2

## 2022-07-14 MED ORDER — HYDROXYZINE HCL 25 MG PO TABS: 25.0000 mg | ORAL_TABLET | Freq: Three times a day (TID) | ORAL | Status: DC | PRN

## 2022-07-14 MED ORDER — GABAPENTIN 100 MG PO CAPS
200.0000 mg | ORAL_CAPSULE | Freq: Three times a day (TID) | ORAL | Status: DC
  Administered 2022-07-14: 200 mg via ORAL
  Filled 2022-07-14: qty 2

## 2022-07-14 MED ORDER — PRAZOSIN HCL 1 MG PO CAPS: 1.0000 mg | ORAL_CAPSULE | Freq: Every day | ORAL | Status: DC

## 2022-07-14 NOTE — Progress Notes (Signed)
[facility] LCSW Progress Note   07/14/2022    2:31 PM  [name]   [id number]   Type of Contact and Topic:  Psychiatric Bed Placement   Pt accepted to [facility]    Patient meets inpatient criteria per Dahlia Byes, NP  The attending provider will be Dr. Loni Beckwith  Call report to [phone]    Sherian Rein, RN @ [facility] ED notified.     Pt scheduled  to arrive at [facility] TODAY ANYTIME.   Damita Dunnings, MSW, LCSW-A  2:33 PM 07/14/2022

## 2022-07-14 NOTE — ED Notes (Signed)
Patient resting in bed with eyes closed, respirations even and unlabored. Awaiting TTS consult.

## 2022-07-14 NOTE — Progress Notes (Signed)
Patient has been denied by [facility] due to no appropriate beds available. Patient meets [facility] inpatient criteria Evelyn Byes, NP. Patient has been faxed out to the following facilities:   [facility]  [address] [place] [place] [address] [phone]  [facility]  [address] [place] [address] [phone]  [facility]  [address] [place] [address] [phone]  [facility]  [address] [place] [address] [phone]  [facility]  [address] [place] [address] [phone]  [facility]  [address], [place] [address] [phone]  [facility]  [address] [place] [address] [phone]  [facility]  [address], [place] [address] [phone]  [facility]  [address] [place], [place] [address] [phone]  [facility]  [address] [place], [place] [address] [phone]  [facility]  [address] [place] [address] [phone]  [facility]  [address], [place] [address] [phone]  [facility]  [address], [place] [address] [phone]  [facility]  [address], [place] [address] [phone]   Damita Dunnings, MSW, LCSW-A  1:52 PM 07/14/2022

## 2022-07-14 NOTE — Consult Note (Signed)
[facility] ED ASSESSMENT   Reason for Consult:  Psychiatry evaluation Referring Physician:  ER Physician Patient Identification: [name] MRN:  [id number] ED Chief Complaint: Suicide attempt [facility])  Diagnosis:  Principal Problem:   Suicide attempt [facility]) Active Problems:   Bipolar I disorder, current or most recent episode depressed, with psychotic features [facility])   ED Assessment Time Calculation: Start Time: 0921 Stop Time: 0950 Total Time in Minutes (Assessment Completion): 29   Subjective:   [name] is a 32 y.o. female patient admitted with previous hx of  of PTSD, borderline personality d/o, Bipolar disorder, GAD & Schizoaffective D/O brought in by EMS after she OD on Gabapentin 600 mg 12 Capsules, Corlanor 8 tablets and having AH Hallucinations tormenting her to commit suicide.  Patient reports things are crawling all over her.  HPI:  Patient is known to the ER and [facility] and has had multiple inpatient Psychiatry hospitalizations.  Patient reports that he OD on two pills because she was having bas thought.  She was discharged from [facility] on 07/09/2022.  Patient states that since she left [facility] she has been having bad thoughts to harm herself.  She is still feeling suicidal but no plan.  She is not having auditory hallucination at this time but continues to have intrusive thought to commit suicide.  Patient requested to be sent to [facility] where she will stay longer.  Provider informed her that she does not meet criteria for [facility] Hospitalization since regular hospitals treat Mentally ill patient.  Patient lives with her ex boyfriend and reports home environment is safe.  She denies Physical, emotional or sexual abuse by her ex boyfriend.  She reports she gets disability monthly and pays for the apartment they share together.  Patient states she is not safe to go back home.  She states she is taking her Medications. Patient eats once or twice a day but sleeps 4-5 hours although she takes  Olanzapine and Trazodone as needed.  She denies  HI/AVH and no mention of Paranoia.today. Female, caucasian seen this morning calm, alert and oriented x4.  She came in after OD on pills due to intrusive thoughts with voices telling her to commit suicide.  Patient want to be admitted to [facility] where she can stay longer than regular hospital.  We will resume home Medications and seek inpatient hospitalization for safety and stabilization.  Past Psychiatric History: previous hx of  of PTSD, borderline personality d/o, Bipolar disorder, GAD & Schizoaffective D/O.  Multiple Inpatient Psychiatry hospitalizations here in [facility] and other facilities across [place].  Discharged from [facility] on June 3rd.  Before this she was hospitalized at [facility].  Multiple suicide attempts, Self injurious behavior  Risk to Self or Others: Is the patient at risk to self? Yes Has the patient been a risk to self in the past 6 months? Yes Has the patient been a risk to self within the distant past? Yes Is the patient a risk to others? No Has the patient been a risk to others in the past 6 months? No Has the patient been a risk to others within the distant past? No  [place] Scale:  Flowsheet Row ED from 07/13/2022 in [facility] Emergency Department at [facility] Most recent reading at 07/13/2022  8:50 PM [facility] (Dis[facility] Most recent reading at 07/06/2022  2:45 PM ED from 07/06/2022 in [facility] Most recent reading at 07/06/2022  3:15 AM  C-SSRS RISK CATEGORY Error: Q6 is Yes, you must answer 7 High Risk Moderate Risk       AIMS:  , , ,  ,   ASAM:    Substance Abuse:     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 fragile X syndrome    Chronic constipation    Depression    Drug-seeking behavior    Essential tremor     Headache    Overdose of acetaminophen 07/2017   and other meds   Personality disorder (HCC)    Schizo-affective psychosis (HCC)    Schizoaffective disorder, bipolar type (HCC)    Seizures (HCC)    Last seizure December 2017   Sleep apnea     Past Surgical History:  Procedure Laterality Date   MOUTH SURGERY  2009 or 2010   Family History:  Family History  Problem Relation Age of Onset   Mental illness Father    Asthma Father    PDD Brother    Seizures Brother    Family Psychiatric  History: Mother and brother suffers from Autism, Brother -Depression and father PTSD. Social History:  Social History   Substance and Sexual Activity  Alcohol Use No   Alcohol/week: 1.0 standard drink of alcohol   Types: 1 Standard drinks or equivalent per week   Comment: denies at this time     Social History   Substance and Sexual Activity  Drug Use No   Comment: History of cocaine use at age 72 for 4 months    Social History   Socioeconomic History   Marital status: Widowed    Spouse name: Not on file   Number of children: 0   Years of education: Not on file   Highest education level: Not on file  Occupational History   Occupation: disability  Tobacco Use   Smoking status: Former    Packs/day: 0    Types: Cigarettes   Smokeless tobacco: Never   Tobacco comments:    Smoked for 2  years age [age]  Vaping Use   Vaping Use: Never used  Substance and Sexual Activity   Alcohol use: No    Alcohol/week: 1.0 standard drink of alcohol    Types: 1 Standard drinks or equivalent per week    Comment: denies at this time   Drug use: No    Comment: History of cocaine use at age 25 for 4 months   Sexual activity: Not Currently    Birth control/protection: None  Other Topics Concern   Not on file  Social History Narrative   Marital status: Widowed      Children: daughter      Lives: with boyfriend, in two story home      Employment:  Disability      Tobacco: quit smoking; smoked  for two years.      Alcohol ;none      Drugs: none   Has not traveled outside of the country.   Right handed         Social Determinants of Health   Financial Resource Strain: Not on file  Food Insecurity: No Food Insecurity (07/06/2022)   Hunger Vital Sign    Worried About Running Out of Food in the Last Year: Never true    Ran Out of Food in the Last Year:  Never true  Transportation Needs: No Transportation Needs (07/06/2022)   PRAPARE - Administrator, Civil Service (Medical): No    Lack of Transportation (Non-Medical): No  Recent Concern: Transportation Needs - Unmet Transportation Needs (05/06/2022)   PRAPARE - Administrator, Civil Service (Medical): Yes    Lack of Transportation (Non-Medical): Yes  Physical Activity: Not on file  Stress: Not on file  Social Connections: Not on file   Additional Social History:    Allergies:   Allergies  Allergen Reactions   Bee Venom Anaphylaxis   Coconut Flavor Anaphylaxis and Rash   Fish Allergy Anaphylaxis   Geodon [Ziprasidone Hcl] Other (See Comments)    Pt states that this medication causes paralysis of the mouth.     Haloperidol And Related Other (See Comments)    Pt states that this medication causes paralysis of the mouth, jaw locks up   Lithobid [Lithium] Other (See Comments)    Seizure-like activity    Roxicodone [Oxycodone] Other (See Comments)    Hallucinations    Seroquel [Quetiapine] Other (See Comments)    Severe drowsiness Pt currently taking 12.5 mg BID, she is ok with this dose   Shellfish Allergy Anaphylaxis   Phenergan [Promethazine Hcl] Other (See Comments)    Chest pain     Prilosec [Omeprazole] Nausea And Vomiting    Pt can take protonix with no problems    Sulfa Antibiotics Other (See Comments)    Chest pain    Tegretol [Carbamazepine] Nausea And Vomiting   Prozac [Fluoxetine] Other (See Comments)    Increased Depression Suicidal thoughts   Tape Other (See Comments)     Skin tears, can only tolerate paper tape.   Tylenol [Acetaminophen] Rash and Other (See Comments)    Rash on face     Labs:  Results for orders placed or performed during the hospital encounter of 07/13/22 (from the past 48 hour(s))  Rapid urine drug screen (hospital performed)     Status: None   Collection Time: 07/13/22  8:23 PM  Result Value Ref Range   Opiates NONE DETECTED NONE DETECTED   Cocaine NONE DETECTED NONE DETECTED   Benzodiazepines NONE DETECTED NONE DETECTED   Amphetamines NONE DETECTED NONE DETECTED   Tetrahydrocannabinol NONE DETECTED NONE DETECTED   Barbiturates NONE DETECTED NONE DETECTED    Comment: (NOTE) DRUG SCREEN FOR MEDICAL PURPOSES ONLY.  IF CONFIRMATION IS NEEDED FOR ANY PURPOSE, NOTIFY LAB WITHIN 5 DAYS.  LOWEST DETECTABLE LIMITS FOR URINE DRUG SCREEN Drug Class                     Cutoff (ng/mL) Amphetamine and metabolites    1000 Barbiturate and metabolites    200 Benzodiazepine                 200 Opiates and metabolites        300 Cocaine and metabolites        300 THC                            50 Performed at [facility], [address] [place], [place] [address]   Comprehensive metabolic panel     Status: Abnormal   Collection Time: 07/13/22  9:00 PM  Result Value Ref Range   Sodium 136 135 - 145 mmol/L   Potassium 3.6 3.5 - 5.1 mmol/L   Chloride 105 98 - 111  mmol/L   CO2 22 22 - 32 mmol/L   Glucose, Bld 133 (H) 70 - 99 mg/dL    Comment: Glucose reference range applies only to samples taken after fasting for at least 8 hours.   BUN 7 6 - 20 mg/dL   Creatinine, Ser 1.61 0.44 - 1.00 mg/dL   Calcium 8.8 (L) 8.9 - 10.3 mg/dL   Total Protein 7.0 6.5 - 8.1 g/dL   Albumin 3.6 3.5 - 5.0 g/dL   AST 18 15 - 41 U/L   ALT 14 0 - 44 U/L   Alkaline Phosphatase 61 38 - 126 U/L   Total Bilirubin 0.2 (L) 0.3 - 1.2 mg/dL   GFR, Estimated >09 >60 mL/Ward    Comment: (NOTE) Calculated using the CKD-EPI Creatinine Equation  (2021)    Anion gap 9 5 - 15    Comment: Performed at [facility], [address] [place], [place] [address]  Ethanol     Status: None   Collection Time: 07/13/22  9:00 PM  Result Value Ref Range   Alcohol, Ethyl (B) <10 <10 mg/dL    Comment: (NOTE) Lowest detectable limit for serum alcohol is 10 mg/dL.  For medical purposes only. Performed at [facility], [address] [place], [place] [address]   Salicylate level     Status: Abnormal   Collection Time: 07/13/22  9:00 PM  Result Value Ref Range   Salicylate Lvl <7.0 (L) 7.0 - 30.0 mg/dL    Comment: Performed at [facility], [address], [place], [place] [address]  Acetaminophen level     Status: Abnormal   Collection Time: 07/13/22  9:00 PM  Result Value Ref Range   Acetaminophen (Tylenol), Serum <10 (L) 10 - 30 ug/mL    Comment: (NOTE) Therapeutic concentrations vary significantly. A range of 10-30 ug/mL  may be an effective concentration for many patients. However, some  are best treated at concentrations outside of this range. Acetaminophen concentrations >150 ug/mL at 4 hours after ingestion  and >50 ug/mL at 12 hours after ingestion are often associated with  toxic reactions.  Performed at [facility], [address] [place], [place] [address]   cbc     Status: Abnormal   Collection Time: 07/13/22  9:00 PM  Result Value Ref Range   WBC 9.8 4.0 - 10.5 K/uL   RBC 4.33 3.87 - 5.11 MIL/uL   Hemoglobin 10.7 (L) 12.0 - 15.0 g/dL   HCT 08.6 (L) 57.8 - 46.9 %   MCV 80.1 80.0 - 100.0 fL   MCH 24.7 (L) 26.0 - 34.0 pg   MCHC 30.8 30.0 - 36.0 g/dL   RDW 62.9 52.8 - 41.3 %   Platelets 278 150 - 400 K/uL   nRBC 0.0 0.0 - 0.2 %    Comment: Performed at [facility], [address] [place], [place] [address]  I-Stat beta hCG blood, ED     Status: None   Collection Time: 07/13/22  9:04 PM  Result Value Ref Range   I-stat  hCG, quantitative <5.0 <5 mIU/mL   Comment 3            Comment:   GEST. AGE      CONC.  (mIU/mL)   <=1 WEEK        5 - 50     2 WEEKS       50 - 500     3 WEEKS  100 - 10,000     4 WEEKS     1,000 - 30,000        FEMALE AND NON-PREGNANT FEMALE:     LESS THAN 5 mIU/mL   Acetaminophen level     Status: Abnormal   Collection Time: 07/14/22  1:45 AM  Result Value Ref Range   Acetaminophen (Tylenol), Serum <10 (L) 10 - 30 ug/mL    Comment: (NOTE) Therapeutic concentrations vary significantly. A range of 10-30 ug/mL  may be an effective concentration for many patients. However, some  are best treated at concentrations outside of this range. Acetaminophen concentrations >150 ug/mL at 4 hours after ingestion  and >50 ug/mL at 12 hours after ingestion are often associated with  toxic reactions.  Performed at [facility], [address] [place], [place] [address]     Current Facility-Administered Medications  Medication Dose Route Frequency Provider Last Rate Last Admin   benztropine (COGENTIN) tablet 1 mg  1 mg Oral QHS Rein Popov C, NP       escitalopram (LEXAPRO) tablet 20 mg  20 mg Oral Daily Eliyana Pagliaro C, NP       gabapentin (NEURONTIN) capsule 200 mg  200 mg Oral TID Dahlia Byes C, NP       hydrOXYzine (ATARAX) tablet 25 mg  25 mg Oral TID PRN Earney Navy, NP       OLANZapine (ZYPREXA) tablet 10 mg  10 mg Oral QHS Georges Victorio C, NP       prazosin (MINIPRESS) capsule 1 mg  1 mg Oral QHS Dahlia Byes C, NP       Current Outpatient Medications  Medication Sig Dispense Refill   albuterol (VENTOLIN HFA) 108 (90 Base) MCG/ACT inhaler Inhale 2 puffs into the lungs every 6 (six) hours as needed for wheezing or shortness of breath.     benztropine (COGENTIN) 1 MG tablet Take 1 tablet (1 mg total) by mouth at bedtime for 7 doses. 7 tablet 0   BREYNA 160-4.5 MCG/ACT inhaler Inhale 1 puff into the lungs in the morning and at  bedtime.     cyclobenzaprine (FLEXERIL) 10 MG tablet Take 10 mg by mouth at bedtime.     diclofenac Sodium (VOLTAREN) 1 % GEL Apply 2 g topically 2 (two) times daily. 2 g 0   escitalopram (LEXAPRO) 20 MG tablet Take 1 tablet (20 mg total) by mouth daily.     gabapentin (NEURONTIN) 600 MG tablet Take 600 mg by mouth 3 (three) times daily.     hydrOXYzine (ATARAX) 25 MG tablet Take 1 tablet (25 mg total) by mouth 3 (three) times daily as needed for anxiety. 30 tablet 0   melatonin 3 MG TABS tablet Take 1 tablet (3 mg total) by mouth at bedtime. 30 tablet 0   OLANZapine zydis (ZYPREXA) 10 MG disintegrating tablet Take 1 tablet (10 mg total) by mouth at bedtime.     pantoprazole (PROTONIX) 40 MG tablet Take 40 mg by mouth daily.     prazosin (MINIPRESS) 1 MG capsule Take 1 mg by mouth at bedtime.     traZODone (DESYREL) 50 MG tablet Take 1 tablet (50 mg total) by mouth at bedtime as needed for up to 7 days for sleep. (Patient taking differently: Take 50 mg by mouth at bedtime.) 7 tablet 0    Musculoskeletal: Strength & Muscle Tone: within normal limits Gait & Station: normal Patient leans: Front   Psychiatric Specialty Exam: Presentation  General Appearance:  Casual; Neat  Eye Contact: Good  Speech: Clear and Coherent; Normal Rate  Speech Volume: Normal  Handedness: Right   Mood and Affect  Mood: Depressed  Affect: Congruent   Thought Process  Thought Processes: Coherent; Goal Directed; Linear  Descriptions of Associations:Intact  Orientation:Full (Time, Place and Person)  Thought Content:Logical  History of Schizophrenia/Schizoaffective disorder:Yes  Duration of Psychotic Symptoms:Greater than six months  Hallucinations:Hallucinations: None  Ideas of Reference:None  Suicidal Thoughts:Suicidal Thoughts: Yes, Active SI Active Intent and/or Plan: Without Plan  Homicidal Thoughts:Homicidal Thoughts: No   Sensorium  Memory: Immediate Good; Recent Good;  Remote Good  Judgment: Intact  Insight: Present   Executive Functions  Concentration: Good  Attention Span: Good  Recall: Good  Fund of Knowledge: Good  Language: Good   Psychomotor Activity  Psychomotor Activity: Psychomotor Activity: Normal   Assets  Assets: Communication Skills; Housing; Intimacy; Physical Health    Sleep  Sleep: Sleep: Fair   Physical Exam: Physical Exam Vitals and nursing note reviewed.  Constitutional:      Appearance: Normal appearance.  HENT:     Head: Normocephalic and atraumatic.     Nose: Nose normal.  Cardiovascular:     Rate and Rhythm: Normal rate and regular rhythm.  Pulmonary:     Effort: Pulmonary effort is normal.  Musculoskeletal:        General: Normal range of motion.     Cervical back: Normal range of motion.  Skin:    General: Skin is warm and dry.  Neurological:     General: No focal deficit present.     Mental Status: She is alert and oriented to person, place, and time.  Psychiatric:        Attention and Perception: Attention and perception normal.        Mood and Affect: Mood is anxious and depressed.        Speech: Speech normal.        Behavior: Behavior normal. Behavior is cooperative.        Thought Content: Thought content normal.        Cognition and Memory: Cognition and memory normal.        Judgment: Judgment is impulsive.    Review of Systems  Constitutional: Negative.   HENT: Negative.    Eyes: Negative.   Respiratory: Negative.    Cardiovascular: Negative.   Gastrointestinal: Negative.   Genitourinary: Negative.   Musculoskeletal: Negative.   Skin: Negative.   Neurological: Negative.   Endo/Heme/Allergies: Negative.   Psychiatric/Behavioral:  Positive for depression and suicidal ideas.    Blood pressure 107/76, pulse 82, temperature 98.3 F (36.8 C), temperature source Oral, resp. rate 18, height 5\' 4"  (1.626 m), weight 95.3 kg, SpO2 99 %. Body mass index is 36.06  kg/m.  Medical Decision Making: Patient remains suicidal with no plan.  She came in after suicide attempt and we seek inpatient  hospitalization.  We will fax records to facilities for available bed.  We will resume home Medications.  Problem 1: Suicide attempt  Problem 2: Bipolar 1 disorder, current episode depressed with Psychotic features.   Disposition:  Admit, seek bed placement.  Earney Navy, NP-PMHNP-BC 07/14/2022 10:44 AM

## 2022-07-14 NOTE — ED Provider Notes (Signed)
  Repeat APAP level WNL.  No acute events.  Will medically clear, plan for TTS consult.    6:01 AM TTS not done overnight.  Rounding day team to follow-up on recommendations.   Garlon Hatchet, PA-C 07/14/22 0601    Nira Conn, MD 07/15/22 [phone]
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
## 2022-07-26 NOTE — Progress Notes (Signed)
[name]. Evelyn Roulston, MD [facility] [email or web address]  

## 2022-07-29 ENCOUNTER — Ambulatory Visit (INDEPENDENT_AMBULATORY_CARE_PROVIDER_SITE_OTHER)
Admission: EM | Admit: 2022-07-29 | Discharge: 2022-07-29 | Disposition: A | Discharge status: Other Acute Inpatient Hospital | Payer: Medicare PPO | Source: Home / Self Care

## 2022-07-29 ENCOUNTER — Encounter (HOSPITAL_COMMUNITY): Payer: Self-pay | Admitting: Emergency Medicine

## 2022-07-29 ENCOUNTER — Encounter (HOSPITAL_COMMUNITY): Payer: Self-pay

## 2022-07-29 ENCOUNTER — Other Ambulatory Visit: Payer: Self-pay

## 2022-07-29 ENCOUNTER — Emergency Department (HOSPITAL_COMMUNITY): Payer: Medicare PPO

## 2022-07-29 ENCOUNTER — Encounter (HOSPITAL_COMMUNITY): Payer: Self-pay | Admitting: Psychiatry

## 2022-07-29 ENCOUNTER — Emergency Department (HOSPITAL_COMMUNITY)
Admission: EM | Admit: 2022-07-29 | Discharge: 2022-07-29 | Disposition: A | Discharge status: Home / Self Care | Payer: Medicare PPO | Source: Home / Self Care | Attending: Emergency Medicine | Admitting: Emergency Medicine

## 2022-07-29 ENCOUNTER — Emergency Department (HOSPITAL_COMMUNITY)
Admission: EM | Admit: 2022-07-29 | Discharge: 2022-07-29 | Disposition: A | Discharge status: Home / Self Care | Payer: Medicare PPO | Attending: Emergency Medicine | Admitting: Emergency Medicine

## 2022-07-29 ENCOUNTER — Inpatient Hospital Stay (HOSPITAL_COMMUNITY)
Admission: AD | Admit: 2022-07-29 | Discharge: 2022-08-02 | DRG: 885 | Disposition: A | Discharge status: Home / Self Care | Payer: Medicare PPO | Source: Intra-hospital | Attending: Psychiatry | Admitting: Psychiatry

## 2022-07-29 DIAGNOSIS — R079 Chest pain, unspecified: Secondary | ICD-10-CM | POA: Insufficient documentation

## 2022-07-29 DIAGNOSIS — R0602 Shortness of breath: Secondary | ICD-10-CM | POA: Insufficient documentation

## 2022-07-29 DIAGNOSIS — Z79899 Other long term (current) drug therapy: Secondary | ICD-10-CM

## 2022-07-29 DIAGNOSIS — F411 Generalized anxiety disorder: Secondary | ICD-10-CM | POA: Diagnosis present

## 2022-07-29 DIAGNOSIS — Y900 Blood alcohol level of less than 20 mg/100 ml: Secondary | ICD-10-CM | POA: Insufficient documentation

## 2022-07-29 DIAGNOSIS — D509 Iron deficiency anemia, unspecified: Secondary | ICD-10-CM | POA: Diagnosis present

## 2022-07-29 DIAGNOSIS — M797 Fibromyalgia: Secondary | ICD-10-CM | POA: Diagnosis not present

## 2022-07-29 DIAGNOSIS — J45909 Unspecified asthma, uncomplicated: Secondary | ICD-10-CM | POA: Diagnosis not present

## 2022-07-29 DIAGNOSIS — F250 Schizoaffective disorder, bipolar type: Secondary | ICD-10-CM | POA: Insufficient documentation

## 2022-07-29 DIAGNOSIS — K5909 Other constipation: Secondary | ICD-10-CM | POA: Diagnosis present

## 2022-07-29 DIAGNOSIS — R45851 Suicidal ideations: Secondary | ICD-10-CM | POA: Insufficient documentation

## 2022-07-29 DIAGNOSIS — Z87891 Personal history of nicotine dependence: Secondary | ICD-10-CM

## 2022-07-29 DIAGNOSIS — Z888 Allergy status to other drugs, medicaments and biological substances status: Secondary | ICD-10-CM | POA: Diagnosis not present

## 2022-07-29 DIAGNOSIS — F251 Schizoaffective disorder, depressive type: Secondary | ICD-10-CM | POA: Diagnosis not present

## 2022-07-29 DIAGNOSIS — F4310 Post-traumatic stress disorder, unspecified: Secondary | ICD-10-CM | POA: Insufficient documentation

## 2022-07-29 DIAGNOSIS — Z825 Family history of asthma and other chronic lower respiratory diseases: Secondary | ICD-10-CM

## 2022-07-29 DIAGNOSIS — Z560 Unemployment, unspecified: Secondary | ICD-10-CM | POA: Diagnosis not present

## 2022-07-29 DIAGNOSIS — K219 Gastro-esophageal reflux disease without esophagitis: Secondary | ICD-10-CM | POA: Diagnosis present

## 2022-07-29 DIAGNOSIS — R0789 Other chest pain: Secondary | ICD-10-CM | POA: Diagnosis present

## 2022-07-29 DIAGNOSIS — Z91030 Bee allergy status: Secondary | ICD-10-CM

## 2022-07-29 DIAGNOSIS — Z5982 Transportation insecurity: Secondary | ICD-10-CM | POA: Diagnosis not present

## 2022-07-29 DIAGNOSIS — F845 Asperger's syndrome: Secondary | ICD-10-CM | POA: Diagnosis not present

## 2022-07-29 DIAGNOSIS — F259 Schizoaffective disorder, unspecified: Secondary | ICD-10-CM

## 2022-07-29 DIAGNOSIS — F603 Borderline personality disorder: Secondary | ICD-10-CM | POA: Diagnosis present

## 2022-07-29 DIAGNOSIS — Z9151 Personal history of suicidal behavior: Secondary | ICD-10-CM | POA: Insufficient documentation

## 2022-07-29 DIAGNOSIS — Z885 Allergy status to narcotic agent status: Secondary | ICD-10-CM

## 2022-07-29 DIAGNOSIS — M1990 Unspecified osteoarthritis, unspecified site: Secondary | ICD-10-CM | POA: Diagnosis present

## 2022-07-29 DIAGNOSIS — Z148 Genetic carrier of other disease: Secondary | ICD-10-CM | POA: Diagnosis not present

## 2022-07-29 DIAGNOSIS — F840 Autistic disorder: Secondary | ICD-10-CM | POA: Insufficient documentation

## 2022-07-29 DIAGNOSIS — Z818 Family history of other mental and behavioral disorders: Secondary | ICD-10-CM

## 2022-07-29 DIAGNOSIS — Z882 Allergy status to sulfonamides status: Secondary | ICD-10-CM | POA: Diagnosis not present

## 2022-07-29 DIAGNOSIS — Z91013 Allergy to seafood: Secondary | ICD-10-CM | POA: Diagnosis not present

## 2022-07-29 DIAGNOSIS — G4700 Insomnia, unspecified: Secondary | ICD-10-CM | POA: Diagnosis present

## 2022-07-29 DIAGNOSIS — Z7289 Other problems related to lifestyle: Secondary | ICD-10-CM

## 2022-07-29 HISTORY — DX: Suicidal ideations: R45.851

## 2022-07-29 HISTORY — DX: Schizoaffective disorder, unspecified: F25.9

## 2022-07-29 HISTORY — DX: Myoneural disorder, unspecified: G70.9

## 2022-07-29 LAB — RAPID URINE DRUG SCREEN, HOSP PERFORMED
Amphetamines: NOT DETECTED
Barbiturates: NOT DETECTED
Benzodiazepines: NOT DETECTED
Cocaine: NOT DETECTED
Opiates: NOT DETECTED
Tetrahydrocannabinol: NOT DETECTED

## 2022-07-29 LAB — CBC WITH DIFFERENTIAL/PLATELET
Abs Immature Granulocytes: 0.04 10*3/uL (ref 0.00–0.07)
Basophils Absolute: 0.1 10*3/uL (ref 0.0–0.1)
Basophils Relative: 1 %
Eosinophils Absolute: 0.1 10*3/uL (ref 0.0–0.5)
Eosinophils Relative: 1 %
HCT: 34.8 % — ABNORMAL LOW (ref 36.0–46.0)
Hemoglobin: 10.6 g/dL — ABNORMAL LOW (ref 12.0–15.0)
Immature Granulocytes: 1 %
Lymphocytes Relative: 31 %
Lymphs Abs: 2.7 10*3/uL (ref 0.7–4.0)
MCH: 24.3 pg — ABNORMAL LOW (ref 26.0–34.0)
MCHC: 30.5 g/dL (ref 30.0–36.0)
MCV: 79.6 fL — ABNORMAL LOW (ref 80.0–100.0)
Monocytes Absolute: 0.6 10*3/uL (ref 0.1–1.0)
Monocytes Relative: 7 %
Neutro Abs: 5.3 10*3/uL (ref 1.7–7.7)
Neutrophils Relative %: 59 %
Platelets: 260 10*3/uL (ref 150–400)
RBC: 4.37 MIL/uL (ref 3.87–5.11)
RDW: 14.4 % (ref 11.5–15.5)
WBC: 8.8 10*3/uL (ref 4.0–10.5)
nRBC: 0 % (ref 0.0–0.2)

## 2022-07-29 LAB — CBC
HCT: 35.9 % — ABNORMAL LOW (ref 36.0–46.0)
Hemoglobin: 11 g/dL — ABNORMAL LOW (ref 12.0–15.0)
MCH: 24.3 pg — ABNORMAL LOW (ref 26.0–34.0)
MCHC: 30.6 g/dL (ref 30.0–36.0)
MCV: 79.4 fL — ABNORMAL LOW (ref 80.0–100.0)
Platelets: 288 10*3/uL (ref 150–400)
RBC: 4.52 MIL/uL (ref 3.87–5.11)
RDW: 14.6 % (ref 11.5–15.5)
WBC: 10.8 10*3/uL — ABNORMAL HIGH (ref 4.0–10.5)
nRBC: 0 % (ref 0.0–0.2)

## 2022-07-29 LAB — BASIC METABOLIC PANEL
Anion gap: 9 (ref 5–15)
BUN: 7 mg/dL (ref 6–20)
CO2: 23 mmol/L (ref 22–32)
Calcium: 8.8 mg/dL — ABNORMAL LOW (ref 8.9–10.3)
Chloride: 104 mmol/L (ref 98–111)
Creatinine, Ser: 0.74 mg/dL (ref 0.44–1.00)
GFR, Estimated: >60 mL/min (ref >60)
Glucose, Bld: 102 mg/dL — ABNORMAL HIGH (ref 70–99)
Potassium: 3.9 mmol/L (ref 3.5–5.1)
Sodium: 136 mmol/L (ref 135–145)

## 2022-07-29 LAB — ACETAMINOPHEN LEVEL: Acetaminophen (Tylenol), Serum: <10 ug/mL — ABNORMAL LOW (ref 10–30)

## 2022-07-29 LAB — TROPONIN I (HIGH SENSITIVITY): Troponin I (High Sensitivity): 3 ng/L (ref <18)

## 2022-07-29 LAB — COMPREHENSIVE METABOLIC PANEL
ALT: 16 U/L (ref 0–44)
AST: 19 U/L (ref 15–41)
Albumin: 3.8 g/dL (ref 3.5–5.0)
Alkaline Phosphatase: 75 U/L (ref 38–126)
Anion gap: 12 (ref 5–15)
BUN: 7 mg/dL (ref 6–20)
CO2: 23 mmol/L (ref 22–32)
Calcium: 9 mg/dL (ref 8.9–10.3)
Chloride: 102 mmol/L (ref 98–111)
Creatinine, Ser: 0.76 mg/dL (ref 0.44–1.00)
GFR, Estimated: >60 mL/min (ref >60)
Glucose, Bld: 101 mg/dL — ABNORMAL HIGH (ref 70–99)
Potassium: 3.7 mmol/L (ref 3.5–5.1)
Sodium: 137 mmol/L (ref 135–145)
Total Bilirubin: 0.3 mg/dL (ref 0.3–1.2)
Total Protein: 7.2 g/dL (ref 6.5–8.1)

## 2022-07-29 LAB — ETHANOL: Alcohol, Ethyl (B): <10 mg/dL (ref <10)

## 2022-07-29 LAB — PREGNANCY, URINE: Preg Test, Ur: NEGATIVE

## 2022-07-29 LAB — SALICYLATE LEVEL: Salicylate Lvl: <7 mg/dL — ABNORMAL LOW (ref 7.0–30.0)

## 2022-07-29 MED ORDER — MAGNESIUM HYDROXIDE 400 MG/5ML PO SUSP
30.0000 mL | Freq: Every day | ORAL | Status: DC | PRN
  Administered 2022-07-31: 30 mL via ORAL
  Filled 2022-07-29: qty 30

## 2022-07-29 MED ORDER — HYDROXYZINE HCL 25 MG PO TABS
25.0000 mg | ORAL_TABLET | Freq: Three times a day (TID) | ORAL | Status: DC | PRN
  Administered 2022-07-30 – 2022-08-01 (×3): 25 mg via ORAL
  Filled 2022-07-29 (×3): qty 1

## 2022-07-29 MED ORDER — PANTOPRAZOLE SODIUM 40 MG PO TBEC
40.0000 mg | DELAYED_RELEASE_TABLET | Freq: Every day | ORAL | Status: DC
  Administered 2022-07-29: 40 mg via ORAL
  Filled 2022-07-29: qty 1

## 2022-07-29 MED ORDER — MAGNESIUM HYDROXIDE 400 MG/5ML PO SUSP: 30.0000 mL | Freq: Every day | ORAL | Status: DC | PRN

## 2022-07-29 MED ORDER — ESCITALOPRAM OXALATE 10 MG PO TABS
20.0000 mg | ORAL_TABLET | Freq: Every day | ORAL | Status: DC
  Administered 2022-07-29: 20 mg via ORAL
  Filled 2022-07-29: qty 2

## 2022-07-29 MED ORDER — LORAZEPAM 1 MG PO TABS: 1.0000 mg | ORAL_TABLET | ORAL | Status: DC | PRN

## 2022-07-29 MED ORDER — OLANZAPINE 10 MG PO TBDP
10.0000 mg | ORAL_TABLET | Freq: Every day | ORAL | Status: DC
  Administered 2022-07-29 – 2022-08-01 (×4): 10 mg via ORAL
  Filled 2022-07-29 (×7): qty 1

## 2022-07-29 MED ORDER — GABAPENTIN 300 MG PO CAPS
600.0000 mg | ORAL_CAPSULE | Freq: Three times a day (TID) | ORAL | Status: DC
  Administered 2022-07-29 – 2022-08-02 (×11): 600 mg via ORAL
  Filled 2022-07-29 (×15): qty 2

## 2022-07-29 MED ORDER — PRAZOSIN HCL 1 MG PO CAPS
1.0000 mg | ORAL_CAPSULE | Freq: Every day | ORAL | Status: DC
  Administered 2022-07-29 – 2022-08-01 (×4): 1 mg via ORAL
  Filled 2022-07-29 (×6): qty 1

## 2022-07-29 MED ORDER — OLANZAPINE 10 MG PO TBDP: 10.0000 mg | ORAL_TABLET | Freq: Every day | ORAL | Status: DC

## 2022-07-29 MED ORDER — PRAZOSIN HCL 1 MG PO CAPS: 1.0000 mg | ORAL_CAPSULE | Freq: Every day | ORAL | Status: DC

## 2022-07-29 MED ORDER — OLANZAPINE 5 MG PO TBDP: 5.0000 mg | ORAL_TABLET | Freq: Three times a day (TID) | ORAL | Status: DC | PRN

## 2022-07-29 MED ORDER — TRAZODONE HCL 50 MG PO TABS: 50.0000 mg | ORAL_TABLET | Freq: Every evening | ORAL | Status: DC | PRN

## 2022-07-29 MED ORDER — CYCLOBENZAPRINE HCL 10 MG PO TABS: 10.0000 mg | ORAL_TABLET | Freq: Every day | ORAL | Status: DC

## 2022-07-29 MED ORDER — ESCITALOPRAM OXALATE 20 MG PO TABS
20.0000 mg | ORAL_TABLET | Freq: Every day | ORAL | Status: DC
  Administered 2022-07-30 – 2022-08-02 (×4): 20 mg via ORAL
  Filled 2022-07-29 (×5): qty 1

## 2022-07-29 MED ORDER — CYCLOBENZAPRINE HCL 10 MG PO TABS
10.0000 mg | ORAL_TABLET | Freq: Every day | ORAL | Status: DC
  Administered 2022-07-29 – 2022-08-01 (×4): 10 mg via ORAL
  Filled 2022-07-29: qty 2
  Filled 2022-07-29 (×6): qty 1

## 2022-07-29 MED ORDER — MELATONIN 3 MG PO TABS: 3.0000 mg | ORAL_TABLET | Freq: Every day | ORAL | Status: DC

## 2022-07-29 MED ORDER — ALUM & MAG HYDROXIDE-SIMETH 200-200-20 MG/5ML PO SUSP
30.0000 mL | ORAL | Status: DC | PRN
Start: 2022-07-29 — End: 2022-07-29

## 2022-07-29 MED ORDER — ALUM & MAG HYDROXIDE-SIMETH 200-200-20 MG/5ML PO SUSP: 30.0000 mL | ORAL | Status: DC | PRN

## 2022-07-29 MED ORDER — MAGNESIUM HYDROXIDE 400 MG/5ML PO SUSP
30.0000 mL | Freq: Every day | ORAL | Status: DC | PRN
Start: 2022-07-29 — End: 2022-07-29

## 2022-07-29 MED ORDER — GABAPENTIN 300 MG PO CAPS
600.0000 mg | ORAL_CAPSULE | Freq: Three times a day (TID) | ORAL | Status: DC
  Administered 2022-07-29 (×2): 600 mg via ORAL
  Filled 2022-07-29 (×2): qty 2

## 2022-07-29 MED ORDER — MELATONIN 3 MG PO TABS
3.0000 mg | ORAL_TABLET | Freq: Every day | ORAL | Status: DC
  Filled 2022-07-29 (×5): qty 1

## 2022-07-29 MED ORDER — HYDROXYZINE HCL 25 MG PO TABS
25.0000 mg | ORAL_TABLET | Freq: Once | ORAL | Status: AC
  Administered 2022-07-29: 25 mg via ORAL
  Filled 2022-07-29: qty 1

## 2022-07-29 MED ORDER — ALBUTEROL SULFATE HFA 108 (90 BASE) MCG/ACT IN AERS
2.0000 | INHALATION_SPRAY | Freq: Four times a day (QID) | RESPIRATORY_TRACT | Status: DC | PRN
  Administered 2022-07-31: 2 via RESPIRATORY_TRACT
  Filled 2022-07-29: qty 6.7

## 2022-07-29 MED ORDER — PANTOPRAZOLE SODIUM 40 MG PO TBEC
40.0000 mg | DELAYED_RELEASE_TABLET | Freq: Every day | ORAL | Status: DC
  Administered 2022-07-30 – 2022-08-02 (×4): 40 mg via ORAL
  Filled 2022-07-29 (×5): qty 1

## 2022-07-29 MED ORDER — IBUPROFEN 600 MG PO TABS
600.0000 mg | ORAL_TABLET | Freq: Once | ORAL | Status: AC
  Administered 2022-07-29: 600 mg via ORAL
  Filled 2022-07-29: qty 1

## 2022-07-29 MED ORDER — TRAZODONE HCL 50 MG PO TABS
50.0000 mg | ORAL_TABLET | Freq: Every evening | ORAL | Status: DC | PRN
Start: 2022-07-29 — End: 2022-07-29

## 2022-07-29 MED ORDER — ALBUTEROL SULFATE HFA 108 (90 BASE) MCG/ACT IN AERS: 2.0000 | INHALATION_SPRAY | Freq: Four times a day (QID) | RESPIRATORY_TRACT | Status: DC | PRN

## 2022-07-29 MED ORDER — TRAZODONE HCL 50 MG PO TABS
50.0000 mg | ORAL_TABLET | Freq: Every evening | ORAL | Status: DC | PRN
  Filled 2022-07-29: qty 1

## 2022-07-29 NOTE — ED Provider Notes (Signed)
EMERGENCY DEPARTMENT AT [facility] Provider Note   CSN: [id number] Arrival date & time: 07/29/22  7829     History  Chief Complaint  Patient presents with   Suicidal    [name] is a 32 y.o. female.  32 yo F with a cc of suicidal ideation.  States that this started after she was discharged with her chest pain earlier.  Says she plans to take some pills to try and take her life.        Home Medications Prior to Admission medications   Medication Sig Start Date End Date Taking? Authorizing Provider  albuterol (VENTOLIN HFA) 108 (90 Base) MCG/ACT inhaler Inhale 2 puffs into the lungs every 6 (six) hours as needed for wheezing or shortness of breath.    [provider]  benztropine (COGENTIN) 1 MG tablet Take 1 tablet (1 mg total) by mouth at bedtime for 7 doses. 07/09/22 07/16/22  Massengill, Harrold Donath, MD  BREYNA 160-4.5 MCG/ACT inhaler Inhale 1 puff into the lungs in the morning and at bedtime. 04/18/22   [provider]  cyclobenzaprine (FLEXERIL) 10 MG tablet Take 10 mg by mouth at bedtime. 05/10/22   [provider]  diclofenac Sodium (VOLTAREN) 1 % GEL Apply 2 g topically 2 (two) times daily. 06/11/22   Starleen Blue, NP  escitalopram (LEXAPRO) 20 MG tablet Take 1 tablet (20 mg total) by mouth daily. 06/24/22   White, Patrice L, NP  gabapentin (NEURONTIN) 600 MG tablet Take 600 mg by mouth 3 (three) times daily. 06/29/22   [provider]  hydrOXYzine (ATARAX) 25 MG tablet Take 1 tablet (25 mg total) by mouth 3 (three) times daily as needed for anxiety. 06/11/22   Starleen Blue, NP  melatonin 3 MG TABS tablet Take 1 tablet (3 mg total) by mouth at bedtime. 06/11/22   Starleen Blue, NP  OLANZapine zydis (ZYPREXA) 10 MG disintegrating tablet Take 1 tablet (10 mg total) by mouth at bedtime. 06/23/22   White, Patrice L, NP  pantoprazole (PROTONIX) 40 MG tablet Take 40 mg by mouth daily.    [provider]  prazosin  (MINIPRESS) 1 MG capsule Take 1 mg by mouth at bedtime.    [provider]  traZODone (DESYREL) 50 MG tablet Take 1 tablet (50 mg total) by mouth at bedtime as needed for up to 7 days for sleep. Patient taking differently: Take 50 mg by mouth at bedtime. 04/30/22 07/06/22  Massengill, Harrold Donath, MD      Allergies    Bee venom, Coconut flavor, Fish allergy, Geodon [ziprasidone hcl], Haloperidol and related, Lithobid [lithium], Roxicodone [oxycodone], Seroquel [quetiapine], Shellfish allergy, Phenergan [promethazine hcl], Prilosec [omeprazole], Sulfa antibiotics, Tegretol [carbamazepine], Prozac [fluoxetine], Tape, and Tylenol [acetaminophen]    Review of Systems   Review of Systems  Physical Exam Updated Vital Signs BP 135/76 (BP Location: Right Arm)   Pulse (!) 101   Temp 98 F (36.7 C) (Oral)   Resp 18   LMP  (LMP Unknown)   SpO2 99%  Physical Exam Vitals and nursing note reviewed.  Constitutional:      General: She is not in acute distress.    Appearance: She is well-developed. She is not diaphoretic.  HENT:     Head: Normocephalic and atraumatic.  Eyes:     Pupils: Pupils are equal, round, and reactive to light.  Cardiovascular:     Rate and Rhythm: Normal rate and regular rhythm.     Heart sounds: No  murmur heard.    No friction rub. No gallop.  Pulmonary:     Effort: Pulmonary effort is normal.     Breath sounds: No wheezing or rales.  Abdominal:     General: There is no distension.     Palpations: Abdomen is soft.     Tenderness: There is no abdominal tenderness.  Musculoskeletal:        General: No tenderness.     Cervical back: Normal range of motion and neck supple.  Skin:    General: Skin is warm and dry.  Neurological:     Mental Status: She is alert and oriented to person, place, and time.  Psychiatric:        Behavior: Behavior normal.     ED Results / Procedures / Treatments   Labs (all labs ordered are listed, but only abnormal results are  displayed) Labs Reviewed  COMPREHENSIVE METABOLIC PANEL - Abnormal; Notable for the following components:      Result Value   Glucose, Bld 101 (*)    All other components within normal limits  SALICYLATE LEVEL - Abnormal; Notable for the following components:   Salicylate Lvl <7.0 (*)    All other components within normal limits  ACETAMINOPHEN LEVEL - Abnormal; Notable for the following components:   Acetaminophen (Tylenol), Serum <10 (*)    All other components within normal limits  CBC - Abnormal; Notable for the following components:   WBC 10.8 (*)    Hemoglobin 11.0 (*)    HCT 35.9 (*)    MCV 79.4 (*)    MCH 24.3 (*)    All other components within normal limits  ETHANOL  RAPID URINE DRUG SCREEN, HOSP PERFORMED  PREGNANCY, URINE  I-STAT BETA HCG BLOOD, ED ([facility], [facility], [facility] ONLY)    EKG None  Radiology DG Chest Port 1 View  Result Date: 07/29/2022 CLINICAL DATA:  Chest pain in the left chest. EXAM: PORTABLE CHEST 1 VIEW COMPARISON:  Radiograph 05/10/2022 FINDINGS: Stable cardiomediastinal silhouette. No focal consolidation, pleural effusion, or pneumothorax. No displaced rib fractures. IMPRESSION: No active disease. Electronically Signed   By: Minerva Fester M.D.   On: 07/29/2022 02:20    Procedures Procedures    Medications Ordered in ED Medications - No data to display  ED Course/ Medical Decision Making/ A&P                             Medical Decision Making Amount and/or Complexity of Data Reviewed Labs: ordered.   32 yo F with a chief complaints of suicidal ideation.  I am very concerned that the patient is using this as an opportunity for secondary gain.  She was discharged just minutes prior to checking back in, I had seen her for her initial visit and there was no mention of suicidal ideation.  I discussed the different follow-up options for her at 1 point she mentioned that she had no ride home.  I think this is probably why she had checked back into the  hospital.  I do not feel that she would benefit from an urgent TTS evaluation.  I encouraged her to go to behavioral health urgent care if she so chooses.  5:11 AM:  I have discussed the diagnosis/risks/treatment options with the patient.  Evaluation and diagnostic testing in the emergency department does not suggest an emergent condition requiring admission or immediate intervention beyond what has been performed at this time.  They  will follow up with PCP. We also discussed returning to the ED immediately if new or worsening sx occur. We discussed the sx which are most concerning (e.g., sudden worsening pain, fever, inability to tolerate by mouth) that necessitate immediate return. Medications administered to the patient during their visit and any new prescriptions provided to the patient are listed below.  Medications given during this visit Medications - No data to display   The patient appears reasonably screen and/or stabilized for discharge and I doubt any other medical condition or other [facility] requiring further screening, evaluation, or treatment in the ED at this time prior to discharge.          Final Clinical Impression(s) / ED Diagnoses Final diagnoses:  Suicidal thoughts    Rx / DC Orders ED Discharge Orders     None         Melene Plan, DO 07/29/22 1610

## 2022-07-29 NOTE — ED Notes (Signed)
Pt A&O x 4, presents with suicidal ideations, plan to overdose on meds.Pt calm & cooperative.  Comfort measures given.  Monitoring for safety.

## 2022-07-29 NOTE — ED Triage Notes (Signed)
Pt in with L chest pain since 1pm yesterday. Pt states she was just visiting her daughter at the time of onset, and it has persisted since. Also states the pain is worse with cough and tender on palpation. Hx of 2 MI's and DVT's. Given 324mg  ASA en route

## 2022-07-29 NOTE — ED Notes (Signed)
Patient talking to peer.  Respirations equal and unlabored, skin warm and dry, NAD. Routine safety checks conducted according to facility protocol. Will continue to monitor for safety.
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
## 2022-07-29 NOTE — ED Provider Notes (Signed)
[place] EMERGENCY DEPARTMENT AT [facility] Provider Note   CSN: [id number] Arrival date & time: 07/29/22  0106     History  Chief Complaint  Patient presents with   Chest Pain    [name] is a 32 y.o. female.  32 yo F with a cc of chest pain.  This is left-sided and started about 12 hours ago.  Occurred at rest.  Nothing seems to make it better or worse.  Some mild shortness of breath with it.  Denies trauma denies cough congestion or fever.   Chest Pain      Home Medications Prior to Admission medications   Medication Sig Start Date End Date Taking? Authorizing Provider  albuterol (VENTOLIN HFA) 108 (90 Base) MCG/ACT inhaler Inhale 2 puffs into the lungs every 6 (six) hours as needed for wheezing or shortness of breath.    [provider]  benztropine (COGENTIN) 1 MG tablet Take 1 tablet (1 mg total) by mouth at bedtime for 7 doses. 07/09/22 07/16/22  Massengill, Harrold Donath, MD  BREYNA 160-4.5 MCG/ACT inhaler Inhale 1 puff into the lungs in the morning and at bedtime. 04/18/22   [provider]  cyclobenzaprine (FLEXERIL) 10 MG tablet Take 10 mg by mouth at bedtime. 05/10/22   [provider]  diclofenac Sodium (VOLTAREN) 1 % GEL Apply 2 g topically 2 (two) times daily. 06/11/22   Starleen Blue, NP  escitalopram (LEXAPRO) 20 MG tablet Take 1 tablet (20 mg total) by mouth daily. 06/24/22   White, Patrice L, NP  gabapentin (NEURONTIN) 600 MG tablet Take 600 mg by mouth 3 (three) times daily. 06/29/22   [provider]  hydrOXYzine (ATARAX) 25 MG tablet Take 1 tablet (25 mg total) by mouth 3 (three) times daily as needed for anxiety. 06/11/22   Starleen Blue, NP  melatonin 3 MG TABS tablet Take 1 tablet (3 mg total) by mouth at bedtime. 06/11/22   Starleen Blue, NP  OLANZapine zydis (ZYPREXA) 10 MG disintegrating tablet Take 1 tablet (10 mg total) by mouth at bedtime. 06/23/22   White, Patrice L, NP  pantoprazole (PROTONIX) 40 MG  tablet Take 40 mg by mouth daily.    [provider]  prazosin (MINIPRESS) 1 MG capsule Take 1 mg by mouth at bedtime.    [provider]  traZODone (DESYREL) 50 MG tablet Take 1 tablet (50 mg total) by mouth at bedtime as needed for up to 7 days for sleep. Patient taking differently: Take 50 mg by mouth at bedtime. 04/30/22 07/06/22  Massengill, Harrold Donath, MD      Allergies    Bee venom, Coconut flavor, Fish allergy, Geodon [ziprasidone hcl], Haloperidol and related, Lithobid [lithium], Roxicodone [oxycodone], Seroquel [quetiapine], Shellfish allergy, Phenergan [promethazine hcl], Prilosec [omeprazole], Sulfa antibiotics, Tegretol [carbamazepine], Prozac [fluoxetine], Tape, and Tylenol [acetaminophen]    Review of Systems   Review of Systems  Cardiovascular:  Positive for chest pain.    Physical Exam Updated Vital Signs BP 123/85 (BP Location: Left Arm)   Pulse 99   Temp 97.9 F (36.6 C) (Oral)   Resp 14   Wt 95.3 kg   LMP  (LMP Unknown)   SpO2 96%   BMI 36.06 kg/m  Physical Exam Vitals and nursing note reviewed.  Constitutional:      General: She is not in acute distress.    Appearance: She is well-developed. She is not diaphoretic.  HENT:     Head: Normocephalic and atraumatic.  Eyes:  Pupils: Pupils are equal, round, and reactive to light.  Cardiovascular:     Rate and Rhythm: Normal rate and regular rhythm.     Heart sounds: No murmur heard.    No friction rub. No gallop.  Pulmonary:     Effort: Pulmonary effort is normal.     Breath sounds: No wheezing or rales.  Abdominal:     General: There is no distension.     Palpations: Abdomen is soft.     Tenderness: There is no abdominal tenderness.  Musculoskeletal:        General: No tenderness.     Cervical back: Normal range of motion and neck supple.  Skin:    General: Skin is warm and dry.  Neurological:     Mental Status: She is alert and oriented to person, place, and time.  Psychiatric:         Behavior: Behavior normal.     ED Results / Procedures / Treatments   Labs (all labs ordered are listed, but only abnormal results are displayed) Labs Reviewed  CBC WITH DIFFERENTIAL/PLATELET - Abnormal; Notable for the following components:      Result Value   Hemoglobin 10.6 (*)    HCT 34.8 (*)    MCV 79.6 (*)    MCH 24.3 (*)    All other components within normal limits  BASIC METABOLIC PANEL - Abnormal; Notable for the following components:   Glucose, Bld 102 (*)    Calcium 8.8 (*)    All other components within normal limits  TROPONIN I (HIGH SENSITIVITY)    EKG EKG Interpretation  Date/Time:  Sunday July 29 2022 01:16:13 EDT Ventricular Rate:  100 PR Interval:  143 QRS Duration: 92 QT Interval:  371 QTC Calculation: 479 R Axis:   47 Text Interpretation: Sinus tachycardia No significant change since last tracing Confirmed by Melene Plan [phone]) on 07/29/2022 1:40:34 AM  Radiology DG Chest Port 1 View  Result Date: 07/29/2022 CLINICAL DATA:  Chest pain in the left chest. EXAM: PORTABLE CHEST 1 VIEW COMPARISON:  Radiograph 05/10/2022 FINDINGS: Stable cardiomediastinal silhouette. No focal consolidation, pleural effusion, or pneumothorax. No displaced rib fractures. IMPRESSION: No active disease. Electronically Signed   By: Minerva Fester M.D.   On: 07/29/2022 02:20    Procedures Procedures    Medications Ordered in ED Medications - No data to display  ED Course/ Medical Decision Making/ A&P                             Medical Decision Making Amount and/or Complexity of Data Reviewed Labs: ordered. Radiology: ordered.   32 yo F with chief complaints of chest pain.  Has been going on for about 12 hours.  Atypical nature and reproduced on exam.  Patient mention to EMS and to nursing that she has had a heart attack and also has had a blood clot.  On my record review I see no mention of this in any of her notes over the past 4 to 5 years and that includes a  visit to cardiology.  Will obtain a single troponin as she has had symptoms greater than 6 hours without significant change.  I feel it is completely typical of pulmonary embolism and no further workup is warranted.  The patient's troponin is negative, chest x-ray independently interpreted by me without focal infiltrate or pneumothorax.  No anemia no significant electrolyte abnormality.  3:05 AM:  I have discussed  the diagnosis/risks/treatment options with the patient.  Evaluation and diagnostic testing in the emergency department does not suggest an emergent condition requiring admission or immediate intervention beyond what has been performed at this time.  They will follow up with PCP. We also discussed returning to the ED immediately if new or worsening sx occur. We discussed the sx which are most concerning (e.g., sudden worsening pain, fever, inability to tolerate by mouth) that necessitate immediate return. Medications administered to the patient during their visit and any new prescriptions provided to the patient are listed below.  Medications given during this visit Medications - No data to display   The patient appears reasonably screen and/or stabilized for discharge and I doubt any other medical condition or other [facility] requiring further screening, evaluation, or treatment in the ED at this time prior to discharge.          Final Clinical Impression(s) / ED Diagnoses Final diagnoses:  Chest wall pain    Rx / DC Orders ED Discharge Orders     None         Melene Plan, DO 07/29/22 [id number]

## 2022-07-29 NOTE — Progress Notes (Signed)
   07/29/22 0551  [facility] Triage Screening (Walk-ins at [facility] only)  How Did You Hear About [name]? Legal System  What Is the Reason for Your Visit/Call Today? Pt presdents to [facility] voluntarily by [employer]. Pt reports being suicidal with a plan to overdose on her medicine. Pt reports having auditory command hallucinations. Pt denies any alcohol or drugs within the past 24 hours. Pt is urgent.  How Long Has This Been Causing You Problems? <Week  Have You Recently Had Any Thoughts About Hurting Yourself? Yes  How long ago did you have thoughts about hurting yourself? This morning  Are You Planning to Commit Suicide/Harm Yourself At This time? Yes  Have you Recently Had Thoughts About Hurting Someone [name]? Yes  How long ago did you have thoughts of harming others? This morning  Are You Planning To Harm Someone At This Time? No  Are you currently experiencing any auditory, visual or other hallucinations? Yes  Please explain the hallucinations you are currently experiencing: hearing voices to harm self and others.  Have You Used Any Alcohol or Drugs in the Past 24 Hours? No  Do you have any current medical co-morbidities that require immediate attention? Yes  Please describe current medical co-morbidities that require immediate attention: Pt reports asthma, fiibromyalgia, tachy cardi  Clinician description of patient physical appearance/behavior: Pt is calm and cooperative.  What Do You Feel Would Help You the Most Today? Treatment for Depression or other mood problem;Social Support  If access to [facility] was not available, would you have sought care in the Emergency Department? Yes  Determination of Need Routine (7 days)  Options For Referral Outpatient Therapy

## 2022-07-29 NOTE — Progress Notes (Signed)
Admitted this 32 y/o patient who was seen in ER for chest pain,medically cleared and released. She then returned to ER and reported S.I., was discharged and went to [facility] where she is accepted for admission with reports of S.I. with plan to overdose. Hx of multiple admission and multiple attempts to kill herself. She has reported hx of Asperger Syndrome,GAD,PTSD,Schizoaffective,Bipolar,PTSD and BPD. She reports hx of Arthritis,Asthma,Fibromyalgia,Gerd,and Orthostatic tachycardia.She has multiple medication allergies. Chera reports she has command hallucinations to kill herself. "I won't do it though." Contracts for safety. Multiple allergies.

## 2022-07-29 NOTE — ED Triage Notes (Addendum)
Pt presents to [facility] voluntarily by [employer]. Pt reports being suicidal with a plan to overdose on her medicine. Pt reports having auditory command hallucinations. Pt denies any alcohol or drugs within the past 24 hours. Pt reports being at the Emergency depart two times and after refusal for evaluation asked [employer] to transport to [facility]. Pt is routine

## 2022-07-29 NOTE — ED Notes (Signed)
Report called to RN Kendal Hymen, [facility].  Pending [facility] Transport to [facility].

## 2022-07-29 NOTE — ED Notes (Signed)
Patient resting quietly in bed with eyes closed. Respirations equal and unlabored, skin warm and dry, NAD. Routine safety checks conducted according to facility protocol. Will continue to monitor for safety.  

## 2022-07-29 NOTE — ED Notes (Signed)
Pt was seen a few weeks ago after suicide attempt, currently denies any active thoughts of wanting to hurt herself

## 2022-07-29 NOTE — Discharge Instructions (Signed)
Go to [facility] now if you feel you need help for your mental health.

## 2022-07-29 NOTE — ED Provider Notes (Signed)
FBC/OBS ASAP Discharge Summary  Date and Time: 07/29/2022 5:01 PM  Name: [name]  MRN:  [id number]   Discharge Diagnoses:  Final diagnoses:  Schizoaffective disorder, bipolar type (HCC)  GAD (generalized anxiety disorder)  Borderline personality disorder (HCC)  Suicidal ideation  07/29/2022 admission HPI: "[name] is a 32 y/o single female with a history of schizoaffective disorder bipolar type, generalized anxiety disorder, autism spectrum disorder, PTSD, borderline personality disorder and chronic suicidal ideations presenting voluntarily to [facility] for suicidal ideations brought in tonight by [employer].  Patient presented earlier at [facility], ED for chest pains and after being evaluated by the ED staff patient was discharged.  After patient was discharged was still at the ED patient stated that she was suicidal and was told by ED staff that she needed to come to [facility] to be evaluated."   Madelyn Flavors, 32 y.o., female patient seen face to face by this provider, consulted with Dr. Viviano Simas; and chart reviewed on 07/29/22.    Subjective:   During evaluation [name] is observed laying in her bed awake in no acute distress.  She is alert/oriented x 4, cooperative, and attentive.  She is casually dressed and makes good eye contact.  Her speech is clear, coherent, normal rate and tone.  She appears visibly anxious and is tapping her hands and her feet on the bed.  She continues to endorse depression and has a dysphoric affect.  States over the past week she has felt more depressed.  Yesterday she had a visit with her 51-year-old daughter and reports that visit went well.  However after that visit she began to feel more agitated and depressed.  She was having chest pain and presented to the emergency department and was discharged.  States she felt very angry and began to feel suicidal.  She continues to feel suicidal and cannot contract for safety.  She states  that she can overdose on medications that she does have access to.  She is denying any homicidal ideations.  She endorses auditory hallucinations of hearing voices that tell her to kill herself.  She denies visual hallucinations.  She does not appear psychotic or manic.  She does not appear to be responding to internal/external stimuli.  Stay Summary: Patient is well-known to the psychiatry service.  She has an extensive psychiatric background and has multiple suicide attempts in the past.  She is unable to contract for safety and continues to be suicidal with a plan to kill herself.  She has poor coping skills and presents often to the psychiatric service complaining of SI.  However patient is very self-aware and typically will let staff know when she feels that she is able to keep herself safe.  She has been unable to contract for safety while on the unit.  She is impulsive and she is adamant that if she is discharged at this time she will overdose on medications.  She is recommended for psychiatric admission at this time.  [facility] notified and patient has been accepted.  Total Time spent with patient: 30 minutes  Past Psychiatric History: see H&P Past Medical History: see H&P Family History: see H&P Family Psychiatric History: see H&P Social History: see H&P Tobacco Cessation:  N/A, patient does not currently use tobacco products  Current Medications:  Current Facility-Administered Medications  Medication Dose Route Frequency Provider Last Rate Last Admin   albuterol (VENTOLIN HFA) 108 (90 Base) MCG/ACT inhaler 2 puff  2 puff Inhalation Q6H PRN Bobbitt, Shalon E, NP       alum & mag hydroxide-simeth (MAALOX/MYLANTA) 200-200-20 MG/5ML suspension 30 mL  30 mL Oral Q4H PRN Bobbitt, Shalon E, NP       cyclobenzaprine (FLEXERIL) tablet 10 mg  10 mg Oral QHS Bobbitt, Shalon E, NP       escitalopram (LEXAPRO) tablet 20 mg  20 mg Oral Daily Bobbitt, Shalon E, NP   20 mg at 07/29/22 1028   gabapentin  (NEURONTIN) capsule 600 mg  600 mg Oral TID Bobbitt, Shalon E, NP   600 mg at 07/29/22 1543   magnesium hydroxide (MILK OF MAGNESIA) suspension 30 mL  30 mL Oral Daily PRN Bobbitt, Shalon E, NP       melatonin tablet 3 mg  3 mg Oral QHS Bobbitt, Shalon E, NP       OLANZapine zydis (ZYPREXA) disintegrating tablet 10 mg  10 mg Oral QHS Bobbitt, Shalon E, NP       pantoprazole (PROTONIX) EC tablet 40 mg  40 mg Oral Daily Bobbitt, Shalon E, NP   40 mg at 07/29/22 1028   prazosin (MINIPRESS) capsule 1 mg  1 mg Oral QHS Bobbitt, Shalon E, NP       traZODone (DESYREL) tablet 50 mg  50 mg Oral QHS PRN Bobbitt, Shalon E, NP       Current Outpatient Medications  Medication Sig Dispense Refill   albuterol (VENTOLIN HFA) 108 (90 Base) MCG/ACT inhaler Inhale 2 puffs into the lungs every 6 (six) hours as needed for wheezing or shortness of breath.     BREYNA 160-4.5 MCG/ACT inhaler Inhale 1 puff into the lungs in the morning and at bedtime.     cyclobenzaprine (FLEXERIL) 10 MG tablet Take 10 mg by mouth at bedtime.     diclofenac Sodium (VOLTAREN) 1 % GEL Apply 2 g topically 2 (two) times daily. 2 g 0   escitalopram (LEXAPRO) 20 MG tablet Take 1 tablet (20 mg total) by mouth daily.     gabapentin (NEURONTIN) 600 MG tablet Take 600 mg by mouth 3 (three) times daily.     hydrOXYzine (ATARAX) 25 MG tablet Take 1 tablet (25 mg total) by mouth 3 (three) times daily as needed for anxiety. 30 tablet 0   OLANZapine zydis (ZYPREXA) 10 MG disintegrating tablet Take 1 tablet (10 mg total) by mouth at bedtime.     pantoprazole (PROTONIX) 40 MG tablet Take 40 mg by mouth daily.     prazosin (MINIPRESS) 1 MG capsule Take 1 mg by mouth at bedtime.     benztropine (COGENTIN) 1 MG tablet Take 1 tablet (1 mg total) by mouth at bedtime for 7 doses. 7 tablet 0    PTA Medications:  Facility Ordered Medications  Medication   alum & mag hydroxide-simeth (MAALOX/MYLANTA) 200-200-20 MG/5ML suspension 30 mL   magnesium hydroxide  (MILK OF MAGNESIA) suspension 30 mL   traZODone (DESYREL) tablet 50 mg   albuterol (VENTOLIN HFA) 108 (90 Base) MCG/ACT inhaler 2 puff   cyclobenzaprine (FLEXERIL) tablet 10 mg   escitalopram (LEXAPRO) tablet 20 mg   gabapentin (NEURONTIN) capsule 600 mg   prazosin (MINIPRESS) capsule 1 mg   pantoprazole (PROTONIX) EC tablet 40 mg   melatonin tablet 3 mg   OLANZapine zydis (ZYPREXA) disintegrating tablet 10 mg   PTA Medications  Medication Sig   albuterol (VENTOLIN HFA) 108 (90 Base) MCG/ACT inhaler Inhale 2 puffs into the lungs every 6 (six) hours as  needed for wheezing or shortness of breath.   BREYNA 160-4.5 MCG/ACT inhaler Inhale 1 puff into the lungs in the morning and at bedtime.   cyclobenzaprine (FLEXERIL) 10 MG tablet Take 10 mg by mouth at bedtime.   diclofenac Sodium (VOLTAREN) 1 % GEL Apply 2 g topically 2 (two) times daily.   hydrOXYzine (ATARAX) 25 MG tablet Take 1 tablet (25 mg total) by mouth 3 (three) times daily as needed for anxiety.   pantoprazole (PROTONIX) 40 MG tablet Take 40 mg by mouth daily.   prazosin (MINIPRESS) 1 MG capsule Take 1 mg by mouth at bedtime.   escitalopram (LEXAPRO) 20 MG tablet Take 1 tablet (20 mg total) by mouth daily.   OLANZapine zydis (ZYPREXA) 10 MG disintegrating tablet Take 1 tablet (10 mg total) by mouth at bedtime.   gabapentin (NEURONTIN) 600 MG tablet Take 600 mg by mouth 3 (three) times daily.   benztropine (COGENTIN) 1 MG tablet Take 1 tablet (1 mg total) by mouth at bedtime for 7 doses.       03/10/2022   10:01 PM 03/10/2022    9:56 PM 02/10/2022    1:49 AM  Depression screen PHQ 2/9  Decreased Interest 1 1 2   Down, Depressed, Hopeless 1 1 2   PHQ - 2 Score 2 2 4   Altered sleeping 1 1 2   Tired, decreased energy 1 1 2   Change in appetite 1 0 2  Feeling bad or failure about yourself  1 1 2   Trouble concentrating 1 1 2   Moving slowly or fidgety/restless 1 1 1   Suicidal thoughts 1 1 1   PHQ-9 Score 9 8 16   Difficult doing  work/chores Somewhat difficult  Very difficult    Flowsheet Row ED from 07/29/2022 in [facility] Most recent reading at 07/29/2022  6:34 AM ED from 07/29/2022 in [facility] Emergency Department at [facility] Most recent reading at 07/29/2022  4:15 AM ED from 07/29/2022 in [facility] Emergency Department at [facility] Most recent reading at 07/29/2022  1:13 AM  C-SSRS RISK CATEGORY High Risk Error: Q2 is Yes, you must answer 3, 4, and 5 High Risk       Musculoskeletal  Strength & Muscle Tone: within normal limits Gait & Station: normal Patient leans: N/A  Psychiatric Specialty Exam  Presentation  General Appearance:  Casual  Eye Contact: Good  Speech: Clear and Coherent; Normal Rate  Speech Volume: Normal  Handedness: Right   Mood and Affect  Mood: Anxious; Depressed  Affect: Congruent   Thought Process  Thought Processes: Coherent  Descriptions of Associations:Intact  Orientation:Full (Time, Place and Person)  Thought Content:Logical  Diagnosis of Schizophrenia or Schizoaffective disorder in past: Yes  Duration of Psychotic Symptoms: Greater than six months   Hallucinations:Hallucinations: Auditory Description of Auditory Hallucinations: voices that tell her to kill herself  Ideas of Reference:None  Suicidal Thoughts:Suicidal Thoughts: Yes, Active SI Active Intent and/or Plan: With Intent; With Plan; With Means to Carry Out SI Passive Intent and/or Plan: With Intent; Without Plan  Homicidal Thoughts:Homicidal Thoughts: No   Sensorium  Memory: Immediate Good; Recent Good; Remote Good  Judgment: Poor  Insight: Poor   Executive Functions  Concentration: Good  Attention Span: Good  Recall: Good  Fund of Knowledge: Good  Language: Good   Psychomotor Activity  Psychomotor Activity: Psychomotor Activity: Normal   Assets  Assets: Communication Skills; Desire for Improvement;  Resilience; Physical Health; Leisure Time; Housing; Financial Resources/Insurance   Sleep  Sleep: Sleep: Poor Number of Hours of Sleep: -1   Nutritional Assessment (For OBS and FBC admissions only) Has the patient had a weight loss or gain of 10 pounds or more in the last 3 months?: No Has the patient had a decrease in food intake/or appetite?: Yes Does the patient have dental problems?: No Does the patient have eating habits or behaviors that may be indicators of an eating disorder including binging or inducing vomiting?: No Has the patient recently lost weight without trying?: 0 Has the patient been eating poorly because of a decreased appetite?: 0 Malnutrition Screening Tool Score: 0    Physical Exam  Physical Exam Vitals and nursing note reviewed.  Constitutional:      General: She is not in acute distress.    Appearance: Normal appearance. She is not ill-appearing.  HENT:     Head: Normocephalic.  Eyes:     General:        Right eye: No discharge.        Left eye: No discharge.  Cardiovascular:     Rate and Rhythm: Normal rate.  Pulmonary:     Effort: Pulmonary effort is normal.  Musculoskeletal:        General: Normal range of motion.     Cervical back: Normal range of motion.  Skin:    Coloration: Skin is not jaundiced or pale.  Neurological:     Mental Status: She is alert and oriented to person, place, and time.  Psychiatric:        Attention and Perception: Attention normal. She perceives auditory hallucinations.        Mood and Affect: Affect normal. Mood is anxious and depressed.        Speech: Speech normal.        Behavior: Behavior is cooperative.        Thought Content: Thought content includes suicidal ideation. Thought content includes suicidal plan.        Cognition and Memory: Cognition normal.        Judgment: Judgment is impulsive.    Review of Systems  Constitutional: Negative.   HENT: Negative.    Eyes: Negative.   Respiratory:  Negative.    Cardiovascular: Negative.   Musculoskeletal: Negative.   Skin: Negative.   Neurological: Negative.   Psychiatric/Behavioral:  Positive for suicidal ideas. The patient is nervous/anxious.    Blood pressure 119/75, pulse 99, temperature 98.4 F (36.9 C), temperature source Oral, resp. rate 18, SpO2 99 %. There is no height or weight on file to calculate BMI.  Disposition: Patient presents to [facility] with suicidal ideation with lesions.  She is unable to contract for safety.  She is recommended for inpatient psychiatric admission.  [facility] notified and patient has been accepted.  Ardis Hughs, NP 07/29/2022, 5:01 PM

## 2022-07-29 NOTE — Tx Team (Signed)
Initial Treatment Plan 07/29/2022 10:13 PM [name] [id number]    PATIENT STRESSORS: Health problems   Loss of Custody of daughter     PATIENT STRENGTHS: Ability for insight  Average or above average intelligence  Capable of independent living  Communication skills  General fund of knowledge  Motivation for treatment/growth    PATIENT IDENTIFIED PROBLEMS:   Ineffective Coping /Depression      Auditory Hallucinations    Anxiety          DISCHARGE CRITERIA:  Improved stabilization in mood, thinking, and/or behavior Motivation to continue treatment in a less acute level of care Reduction of life-threatening or endangering symptoms to within safe limits Verbal commitment to aftercare and medication compliance  PRELIMINARY DISCHARGE PLAN: Outpatient therapy Return to previous living arrangement  PATIENT/FAMILY INVOLVEMENT: This treatment plan has been presented to and reviewed with the patient, [name], and/or family member, .  The patient and family have been given the opportunity to ask questions and make suggestions.  Lawrence Santiago, RN 07/29/2022, 10:13 PM

## 2022-07-29 NOTE — ED Notes (Signed)
[facility] for transfer to [facility], rm 501-2.

## 2022-07-29 NOTE — Group Note (Signed)
Date:  07/29/2022 Time:  9:22 PM  Group Topic/Focus:  Wrap-Up Group:   The focus of this group is to help patients review their daily goal of treatment and discuss progress on daily workbooks.    Participation Level:  Did Not Attend   Scot Dock 07/29/2022, 9:22 PM

## 2022-07-29 NOTE — Progress Notes (Signed)
   07/29/22 0550  Vital Signs  Temp 98.4 F (36.9 C)  Temp Source Oral  Pulse Rate 99  Pulse Rate Source Dinamap  Resp 18  BP 119/75  BP Location Right Arm  BP Method Automatic  Patient Position (if appropriate) Sitting  Oxygen Therapy  SpO2 99 %  O2 Device Room Air

## 2022-07-29 NOTE — ED Triage Notes (Signed)
Pt discharged after complete chest pain work up has now become suicidal. Pt states that she is going to go home and take a bunch of pills to die.

## 2022-07-29 NOTE — ED Provider Notes (Signed)
[facility] Continuous Assessment Admission H&P  Date: 07/29/22 Patient Name: Donae Kueker MRN: [id number] Chief Complaint: Suicidal  Diagnoses:  Final diagnoses:  Schizoaffective disorder, bipolar type (HCC)  GAD (generalized anxiety disorder)  Borderline personality disorder (HCC)  Suicidal ideation    HPI: Verner Kopischke. Broxton is a 32 y/o single female with a history of schizoaffective disorder bipolar type, generalized anxiety disorder, autism spectrum disorder, PTSD, borderline personality disorder and chronic suicidal ideations presenting voluntarily to [facility] for suicidal ideations brought in tonight by [employer].  Patient presented earlier at [facility] ED for chest pains and after being evaluated by the ED staff patient was discharged.  After patient was discharged was still at the ED patient stated that she was suicidal and was told by ED staff that she needed to come to [facility] to be evaluated.  Nurse practitioner assessed patient face-to-face and reviewed her chart.  Patient has had 34 ED visits in the last 6 months with similar presentation of suicidality.  Patient reports that she was recently released from [facility] about a week ago for suicidal ideation.  Patient is alert oriented x 4, speech is clear and coherent, mood is anxious with congruent affect.  Patient reports that her suicidal thoughts started when she was having auditory hallucinations around 3 AM this morning.  Patient does not appear to be responding to any internal this time.  Patient reports that the voices were telling her that she needs to destroy everything and hurt herself.  Patient endorses feeling depressed and anxious with poor sleep, poor appetite and suicidal thoughts.   Patient reports that yesterday she had visitation with her 71-year-old daughter who is not in her custody and she wanted to visit to last longer but was not able to extend the visit.  Patient reports this may have triggered her  auditory hallucinations and suicidal thoughts  Patient reports she is still receiving CST services from [facility].  Patient reports that she does not feel that Zyprexa that she is taking is controlling her auditory hallucinations. Patient denies any any substance abuse or alcohol abuse and denies any paranoia or visual hallucinations.  Patient is unable to contract for safety and will be admitted to [facility] continuous assessment for crisis management, stabilization and safety.   Total Time spent with patient: 15 minutes  Musculoskeletal  Strength & Muscle Tone: within normal limits Gait & Station: normal Patient leans: N/A  Psychiatric Specialty Exam  Presentation General Appearance:  Casual  Eye Contact: Good  Speech: Clear and Coherent  Speech Volume: Normal  Handedness: Right   Mood and Affect  Mood: Anxious  Affect: Congruent   Thought Process  Thought Processes: Coherent  Descriptions of Associations:Intact  Orientation:Full (Time, Place and Person)  Thought Content:Logical  Diagnosis of Schizophrenia or Schizoaffective disorder in past: Yes  Duration of Psychotic Symptoms: Greater than six months  Hallucinations:Hallucinations: Auditory Description of Auditory Hallucinations: To destroy everything and herself  Ideas of Reference:None  Suicidal Thoughts:Suicidal Thoughts: Yes, Active SI Active Intent and/or Plan: Without Plan SI Passive Intent and/or Plan: With Intent; Without Plan  Homicidal Thoughts:Homicidal Thoughts: No   Sensorium  Memory: Immediate Good; Recent Good; Remote Good  Judgment: Fair  Insight: Present   Executive Functions  Concentration: Fair  Attention Span: Fair  Recall: Fair  Fund of Knowledge: Fair  Language: Good   Psychomotor Activity  Psychomotor Activity: Psychomotor Activity: Normal   Assets  Assets: Manufacturing systems engineer; Housing; Physical Health  Sleep  Sleep: Sleep: Poor Number of  Hours of Sleep: -1   Nutritional Assessment (For OBS and FBC admissions only) Has the patient had a weight loss or gain of 10 pounds or more in the last 3 months?: No Has the patient had a decrease in food intake/or appetite?: Yes Does the patient have dental problems?: No Does the patient have eating habits or behaviors that may be indicators of an eating disorder including binging or inducing vomiting?: No Has the patient recently lost weight without trying?: 0 Has the patient been eating poorly because of a decreased appetite?: 0 Malnutrition Screening Tool Score: 0    Physical Exam Constitutional:      Appearance: Normal appearance.  HENT:     Head: Normocephalic and atraumatic.     Nose: Nose normal.     Mouth/Throat:     Mouth: Mucous membranes are moist.  Eyes:     Pupils: Pupils are equal, round, and reactive to light.  Cardiovascular:     Rate and Rhythm: Normal rate.  Pulmonary:     Effort: Pulmonary effort is normal.  Abdominal:     General: Abdomen is flat.  Musculoskeletal:        General: Normal range of motion.     Cervical back: Normal range of motion.  Skin:    General: Skin is warm.  Neurological:     General: No focal deficit present.     Mental Status: She is alert.  Psychiatric:        Attention and Perception: She perceives auditory hallucinations.        Mood and Affect: Mood is anxious and depressed.        Speech: Speech normal.        Behavior: Behavior is cooperative.        Thought Content: Thought content includes suicidal ideation.        Cognition and Memory: Cognition normal.        Judgment: Judgment is impulsive.    Review of Systems  Constitutional: Negative.   HENT: Negative.    Eyes: Negative.   Respiratory: Negative.    Cardiovascular: Negative.   Gastrointestinal: Negative.   Genitourinary: Negative.   Musculoskeletal: Negative.   Skin: Negative.   Neurological: Negative.   Endo/Heme/Allergies: Negative.    Psychiatric/Behavioral:  Positive for depression, hallucinations and suicidal ideas.     Blood pressure 119/75, pulse 99, temperature 98.4 F (36.9 C), temperature source Oral, resp. rate 18, SpO2 99 %. There is no height or weight on file to calculate BMI.  Past Psychiatric History: [facility] 01/2022  [facility] -2024  Is the patient at risk to self? Yes  Has the patient been a risk to self in the past 6 months? Yes .    Has the patient been a risk to self within the distant past? Yes   Is the patient a risk to others? No   Has the patient been a risk to others in the past 6 months? No   Has the patient been a risk to others within the distant past? 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, constipation, headache, anxiety, sleep apnea  Family History: Father-mental illness  Social History: 32 year old female, widowed, lives with roommate, unemployed and receives disability income  Last Labs:  Admission on 07/29/2022, Discharged on 07/29/2022  Component Date Value Ref Range Status   Sodium 07/29/2022 137  135 - 145 mmol/L Final  Potassium 07/29/2022 3.7  3.5 - 5.1 mmol/L Final   Chloride 07/29/2022 102  98 - 111 mmol/L Final   CO2 07/29/2022 23  22 - 32 mmol/L Final   Glucose, Bld 07/29/2022 101 (H)  70 - 99 mg/dL Final   Glucose reference range applies only to samples taken after fasting for at least 8 hours.   BUN 07/29/2022 7  6 - 20 mg/dL Final   Creatinine, Ser 07/29/2022 0.76  0.44 - 1.00 mg/dL Final   Calcium 16/11/9602 9.0  8.9 - 10.3 mg/dL Final   Total Protein 54/10/8117 7.2  6.5 - 8.1 g/dL Final   Albumin 14/78/2956 3.8  3.5 - 5.0 g/dL Final   AST 21/30/8657 19  15 - 41 U/L Final   ALT 07/29/2022 16  0 - 44 U/L Final   Alkaline Phosphatase 07/29/2022 75  38 - 126 U/L Final   Total Bilirubin 07/29/2022 0.3  0.3 - 1.2 mg/dL Final   GFR, Estimated 07/29/2022 >60  >60 mL/min Final   Comment: (NOTE) Calculated  using the CKD-EPI Creatinine Equation (2021)    Anion gap 07/29/2022 12  5 - 15 Final   Performed at [facility], [address] [place], [place] [address]   Alcohol, Ethyl (B) 07/29/2022 <10  <10 mg/dL Final   Comment: (NOTE) Lowest detectable limit for serum alcohol is 10 mg/dL.  For medical purposes only. Performed at [facility], [address] [place], [place] [address]    Salicylate Lvl 07/29/2022 <7.0 (L)  7.0 - 30.0 mg/dL Final   Performed at [facility], [address], [place], [place] [address]   Acetaminophen (Tylenol), Serum 07/29/2022 <10 (L)  10 - 30 ug/mL Final   Comment: (NOTE) Therapeutic concentrations vary significantly. A range of 10-30 ug/mL  may be an effective concentration for many patients. However, some  are best treated at concentrations outside of this range. Acetaminophen concentrations >150 ug/mL at 4 hours after ingestion  and >50 ug/mL at 12 hours after ingestion are often associated with  toxic reactions.  Performed at [facility], [address], [place], [place] [address]    WBC 07/29/2022 10.8 (H)  4.0 - 10.5 K/uL Final   RBC 07/29/2022 4.52  3.87 - 5.11 MIL/uL Final   Hemoglobin 07/29/2022 11.0 (L)  12.0 - 15.0 g/dL Final   HCT 72/53/6644 35.9 (L)  36.0 - 46.0 % Final   MCV 07/29/2022 79.4 (L)  80.0 - 100.0 fL Final   MCH 07/29/2022 24.3 (L)  26.0 - 34.0 pg Final   MCHC 07/29/2022 30.6  30.0 - 36.0 g/dL Final   RDW 03/47/4259 14.6  11.5 - 15.5 % Final   Platelets 07/29/2022 288  150 - 400 K/uL Final   nRBC 07/29/2022 0.0  0.0 - 0.2 % Final   Performed at [facility], [address], [place], [place] [address]   Opiates 07/29/2022 NONE DETECTED  NONE DETECTED Final   Cocaine 07/29/2022 NONE DETECTED  NONE DETECTED Final   Benzodiazepines 07/29/2022 NONE DETECTED  NONE DETECTED Final   Amphetamines 07/29/2022 NONE DETECTED  NONE DETECTED Final   Tetrahydrocannabinol 07/29/2022 NONE DETECTED  NONE  DETECTED Final   Barbiturates 07/29/2022 NONE DETECTED  NONE DETECTED Final   Comment: (NOTE) DRUG SCREEN FOR MEDICAL PURPOSES ONLY.  IF CONFIRMATION IS NEEDED FOR ANY PURPOSE, NOTIFY LAB WITHIN 5 DAYS.  LOWEST DETECTABLE LIMITS FOR URINE DRUG SCREEN Drug Class  Cutoff (ng/mL) Amphetamine and metabolites    1000 Barbiturate and metabolites    200 Benzodiazepine                 200 Opiates and metabolites        300 Cocaine and metabolites        300 THC                            50 Performed at [facility], [address], [place], [place] [address]    Preg Test, Ur 07/29/2022 NEGATIVE  NEGATIVE Final   Comment:        THE SENSITIVITY OF THIS METHODOLOGY IS >25 mIU/mL. Performed at [facility], [address] [place], [place] [address]   Admission on 07/29/2022, Discharged on 07/29/2022  Component Date Value Ref Range Status   WBC 07/29/2022 8.8  4.0 - 10.5 K/uL Final   RBC 07/29/2022 4.37  3.87 - 5.11 MIL/uL Final   Hemoglobin 07/29/2022 10.6 (L)  12.0 - 15.0 g/dL Final   HCT 19/14/7829 34.8 (L)  36.0 - 46.0 % Final   MCV 07/29/2022 79.6 (L)  80.0 - 100.0 fL Final   MCH 07/29/2022 24.3 (L)  26.0 - 34.0 pg Final   MCHC 07/29/2022 30.5  30.0 - 36.0 g/dL Final   RDW 56/21/3086 14.4  11.5 - 15.5 % Final   Platelets 07/29/2022 260  150 - 400 K/uL Final   nRBC 07/29/2022 0.0  0.0 - 0.2 % Final   Neutrophils Relative % 07/29/2022 59  % Final   Neutro Abs 07/29/2022 5.3  1.7 - 7.7 K/uL Final   Lymphocytes Relative 07/29/2022 31  % Final   Lymphs Abs 07/29/2022 2.7  0.7 - 4.0 K/uL Final   Monocytes Relative 07/29/2022 7  % Final   Monocytes Absolute 07/29/2022 0.6  0.1 - 1.0 K/uL Final   Eosinophils Relative 07/29/2022 1  % Final   Eosinophils Absolute 07/29/2022 0.1  0.0 - 0.5 K/uL Final   Basophils Relative 07/29/2022 1  % Final   Basophils Absolute 07/29/2022 0.1  0.0 - 0.1 K/uL Final   Immature Granulocytes 07/29/2022 1  % Final   Abs  Immature Granulocytes 07/29/2022 0.04  0.00 - 0.07 K/uL Final   Performed at [facility], [address] [place], [place] [address]   Sodium 07/29/2022 136  135 - 145 mmol/L Final   Potassium 07/29/2022 3.9  3.5 - 5.1 mmol/L Final   Chloride 07/29/2022 104  98 - 111 mmol/L Final   CO2 07/29/2022 23  22 - 32 mmol/L Final   Glucose, Bld 07/29/2022 102 (H)  70 - 99 mg/dL Final   Glucose reference range applies only to samples taken after fasting for at least 8 hours.   BUN 07/29/2022 7  6 - 20 mg/dL Final   Creatinine, Ser 07/29/2022 0.74  0.44 - 1.00 mg/dL Final   Calcium 96/29/5284 8.8 (L)  8.9 - 10.3 mg/dL Final   GFR, Estimated 07/29/2022 >60  >60 mL/min Final   Comment: (NOTE) Calculated using the CKD-EPI Creatinine Equation (2021)    Anion gap 07/29/2022 9  5 - 15 Final   Performed at [facility], [address], [place], [place] [address]   Troponin I (High Sensitivity) 07/29/2022 3  <18 ng/L Final   Comment: (NOTE) Elevated high sensitivity troponin I (hsTnI) values and significant  changes across serial measurements  may suggest ACS but many other  chronic and acute conditions are known to elevate hsTnI results.  Refer to the "Links" section for chest pain algorithms and additional  guidance. Performed at [facility], [address], [place], [place] [address]   Admission on 07/13/2022, Discharged on 07/14/2022  Component Date Value Ref Range Status   Sodium 07/13/2022 136  135 - 145 mmol/L Final   Potassium 07/13/2022 3.6  3.5 - 5.1 mmol/L Final   Chloride 07/13/2022 105  98 - 111 mmol/L Final   CO2 07/13/2022 22  22 - 32 mmol/L Final   Glucose, Bld 07/13/2022 133 (H)  70 - 99 mg/dL Final   Glucose reference range applies only to samples taken after fasting for at least 8 hours.   BUN 07/13/2022 7  6 - 20 mg/dL Final   Creatinine, Ser 07/13/2022 0.73  0.44 - 1.00 mg/dL Final   Calcium 69/62/9528 8.8 (L)  8.9 - 10.3 mg/dL Final   Total Protein  07/13/2022 7.0  6.5 - 8.1 g/dL Final   Albumin 41/32/4401 3.6  3.5 - 5.0 g/dL Final   AST 02/72/5366 18  15 - 41 U/L Final   ALT 07/13/2022 14  0 - 44 U/L Final   Alkaline Phosphatase 07/13/2022 61  38 - 126 U/L Final   Total Bilirubin 07/13/2022 0.2 (L)  0.3 - 1.2 mg/dL Final   GFR, Estimated 07/13/2022 >60  >60 mL/min Final   Comment: (NOTE) Calculated using the CKD-EPI Creatinine Equation (2021)    Anion gap 07/13/2022 9  5 - 15 Final   Performed at [facility], [address] [place], [place] [address]   Alcohol, Ethyl (B) 07/13/2022 <10  <10 mg/dL Final   Comment: (NOTE) Lowest detectable limit for serum alcohol is 10 mg/dL.  For medical purposes only. Performed at [facility], [address] [place], [place] [address]    Salicylate Lvl 07/13/2022 <7.0 (L)  7.0 - 30.0 mg/dL Final   Performed at [facility], [address], [place], [place] [address]   Acetaminophen (Tylenol), Serum 07/13/2022 <10 (L)  10 - 30 ug/mL Final   Comment: (NOTE) Therapeutic concentrations vary significantly. A range of 10-30 ug/mL  may be an effective concentration for many patients. However, some  are best treated at concentrations outside of this range. Acetaminophen concentrations >150 ug/mL at 4 hours after ingestion  and >50 ug/mL at 12 hours after ingestion are often associated with  toxic reactions.  Performed at [facility], [address], [place], [place] [address]    WBC 07/13/2022 9.8  4.0 - 10.5 K/uL Final   RBC 07/13/2022 4.33  3.87 - 5.11 MIL/uL Final   Hemoglobin 07/13/2022 10.7 (L)  12.0 - 15.0 g/dL Final   HCT 29/51/8841 34.7 (L)  36.0 - 46.0 % Final   MCV 07/13/2022 80.1  80.0 - 100.0 fL Final   MCH 07/13/2022 24.7 (L)  26.0 - 34.0 pg Final   MCHC 07/13/2022 30.8  30.0 - 36.0 g/dL Final   RDW 66/07/3014 14.3  11.5 - 15.5 % Final   Platelets 07/13/2022 278  150 - 400 K/uL Final   nRBC  07/13/2022 0.0  0.0 - 0.2 % Final   Performed at [facility], [address], [place], [place] [address]   Opiates 07/13/2022 NONE DETECTED  NONE DETECTED Final   Cocaine 07/13/2022 NONE DETECTED  NONE DETECTED Final   Benzodiazepines 07/13/2022 NONE  DETECTED  NONE DETECTED Final   Amphetamines 07/13/2022 NONE DETECTED  NONE DETECTED Final   Tetrahydrocannabinol 07/13/2022 NONE DETECTED  NONE DETECTED Final   Barbiturates 07/13/2022 NONE DETECTED  NONE DETECTED Final   Comment: (NOTE) DRUG SCREEN FOR MEDICAL PURPOSES ONLY.  IF CONFIRMATION IS NEEDED FOR ANY PURPOSE, NOTIFY LAB WITHIN 5 DAYS.  LOWEST DETECTABLE LIMITS FOR URINE DRUG SCREEN Drug Class                     Cutoff (ng/mL) Amphetamine and metabolites    1000 Barbiturate and metabolites    200 Benzodiazepine                 200 Opiates and metabolites        300 Cocaine and metabolites        300 THC                            50 Performed at [facility], [address] [place], [place] [address]    I-stat hCG, quantitative 07/13/2022 <5.0  <5 mIU/mL Final   Comment 3 07/13/2022          Final   Comment:   GEST. AGE      CONC.  (mIU/mL)   <=1 WEEK        5 - 50     2 WEEKS       50 - 500     3 WEEKS       100 - 10,000     4 WEEKS     1,000 - 30,000        FEMALE AND NON-PREGNANT FEMALE:     LESS THAN 5 mIU/mL    Acetaminophen (Tylenol), Serum 07/14/2022 <10 (L)  10 - 30 ug/mL Final   Comment: (NOTE) Therapeutic concentrations vary significantly. A range of 10-30 ug/mL  may be an effective concentration for many patients. However, some  are best treated at concentrations outside of this range. Acetaminophen concentrations >150 ug/mL at 4 hours after ingestion  and >50 ug/mL at 12 hours after ingestion are often associated with  toxic reactions.  Performed at [facility], [address] [place], [place] [address]   Admission on 07/06/2022, Discharged  on 07/06/2022  Component Date Value Ref Range Status   Preg Test, Ur 07/06/2022 Negative  Negative Final   POC Amphetamine UR 07/06/2022 None Detected  NONE DETECTED (Cut Off Level 1000 ng/mL) Final   POC Secobarbital (BAR) 07/06/2022 None Detected  NONE DETECTED (Cut Off Level 300 ng/mL) Final   POC Buprenorphine (BUP) 07/06/2022 None Detected  NONE DETECTED (Cut Off Level 10 ng/mL) Final   POC Oxazepam (BZO) 07/06/2022 Positive (A)  NONE DETECTED (Cut Off Level 300 ng/mL) Final   POC Cocaine UR 07/06/2022 None Detected  NONE DETECTED (Cut Off Level 300 ng/mL) Final   POC Methamphetamine UR 07/06/2022 None Detected  NONE DETECTED (Cut Off Level 1000 ng/mL) Final   POC Morphine 07/06/2022 None Detected  NONE DETECTED (Cut Off Level 300 ng/mL) Final   POC Methadone UR 07/06/2022 None Detected  NONE DETECTED (Cut Off Level 300 ng/mL) Final   POC Oxycodone UR 07/06/2022 None Detected  NONE DETECTED (Cut Off Level 100 ng/mL) Final   POC Marijuana UR 07/06/2022 None Detected  NONE DETECTED (Cut Off Level 50 ng/mL) Final   Preg Test, Ur 07/06/2022 NEGATIVE  NEGATIVE Final   Comment:  THE SENSITIVITY OF THIS METHODOLOGY IS >24 mIU/mL    Sodium 07/06/2022 133 (L)  135 - 145 mmol/L Final   Potassium 07/06/2022 3.2 (L)  3.5 - 5.1 mmol/L Final   Chloride 07/06/2022 104  98 - 111 mmol/L Final   CO2 07/06/2022 23  22 - 32 mmol/L Final   Glucose, Bld 07/06/2022 101 (H)  70 - 99 mg/dL Final   Glucose reference range applies only to samples taken after fasting for at least 8 hours.   BUN 07/06/2022 6  6 - 20 mg/dL Final   Creatinine, Ser 07/06/2022 0.74  0.44 - 1.00 mg/dL Final   Calcium 16/11/9602 8.9  8.9 - 10.3 mg/dL Final   Total Protein 54/10/8117 6.8  6.5 - 8.1 g/dL Final   Albumin 14/78/2956 3.8  3.5 - 5.0 g/dL Final   AST 21/30/8657 15  15 - 41 U/L Final   ALT 07/06/2022 14  0 - 44 U/L Final   Alkaline Phosphatase 07/06/2022 71  38 - 126 U/L Final   Total Bilirubin 07/06/2022 0.4  0.3 -  1.2 mg/dL Final   GFR, Estimated 07/06/2022 >60  >60 mL/min Final   Comment: (NOTE) Calculated using the CKD-EPI Creatinine Equation (2021)    Anion gap 07/06/2022 6  5 - 15 Final   Performed at [facility], [address] [place], [place] [address]   WBC 07/06/2022 7.2  4.0 - 10.5 K/uL Final   RBC 07/06/2022 4.65  3.87 - 5.11 MIL/uL Final   Hemoglobin 07/06/2022 11.8 (L)  12.0 - 15.0 g/dL Final   HCT 29/52/8413 37.3  36.0 - 46.0 % Final   MCV 07/06/2022 80.2  80.0 - 100.0 fL Final   MCH 07/06/2022 25.4 (L)  26.0 - 34.0 pg Final   MCHC 07/06/2022 31.6  30.0 - 36.0 g/dL Final   RDW 24/40/1027 14.4  11.5 - 15.5 % Final   Platelets 07/06/2022 274  150 - 400 K/uL Final   nRBC 07/06/2022 0.0  0.0 - 0.2 % Final   Neutrophils Relative % 07/06/2022 60  % Final   Neutro Abs 07/06/2022 4.3  1.7 - 7.7 K/uL Final   Lymphocytes Relative 07/06/2022 32  % Final   Lymphs Abs 07/06/2022 2.3  0.7 - 4.0 K/uL Final   Monocytes Relative 07/06/2022 7  % Final   Monocytes Absolute 07/06/2022 0.5  0.1 - 1.0 K/uL Final   Eosinophils Relative 07/06/2022 0  % Final   Eosinophils Absolute 07/06/2022 0.0  0.0 - 0.5 K/uL Final   Basophils Relative 07/06/2022 1  % Final   Basophils Absolute 07/06/2022 0.0  0.0 - 0.1 K/uL Final   Immature Granulocytes 07/06/2022 0  % Final   Abs Immature Granulocytes 07/06/2022 0.02  0.00 - 0.07 K/uL Final   Performed at [facility], [address] [place], [place] [address]  Admission on 06/22/2022, Discharged on 06/23/2022  Component Date Value Ref Range Status   WBC 06/22/2022 8.1  4.0 - 10.5 K/uL Final   RBC 06/22/2022 4.45  3.87 - 5.11 MIL/uL Final   Hemoglobin 06/22/2022 10.8 (L)  12.0 - 15.0 g/dL Final   HCT 44/04/4740 34.7 (L)  36.0 - 46.0 % Final   MCV 06/22/2022 78.0 (L)  80.0 - 100.0 fL Final   MCH 06/22/2022 24.3 (L)  26.0 - 34.0 pg Final   MCHC 06/22/2022 31.1  30.0 - 36.0 g/dL Final   RDW 59/56/3875 14.4  11.5 - 15.5 % Final  Platelets  06/22/2022 299  150 - 400 K/uL Final   nRBC 06/22/2022 0.0  0.0 - 0.2 % Final   Neutrophils Relative % 06/22/2022 63  % Final   Neutro Abs 06/22/2022 5.1  1.7 - 7.7 K/uL Final   Lymphocytes Relative 06/22/2022 30  % Final   Lymphs Abs 06/22/2022 2.4  0.7 - 4.0 K/uL Final   Monocytes Relative 06/22/2022 6  % Final   Monocytes Absolute 06/22/2022 0.5  0.1 - 1.0 K/uL Final   Eosinophils Relative 06/22/2022 1  % Final   Eosinophils Absolute 06/22/2022 0.1  0.0 - 0.5 K/uL Final   Basophils Relative 06/22/2022 0  % Final   Basophils Absolute 06/22/2022 0.0  0.0 - 0.1 K/uL Final   Immature Granulocytes 06/22/2022 0  % Final   Abs Immature Granulocytes 06/22/2022 0.03  0.00 - 0.07 K/uL Final   Performed at [facility], [address] [place], [place] [address]   Sodium 06/22/2022 138  135 - 145 mmol/L Final   Potassium 06/22/2022 3.6  3.5 - 5.1 mmol/L Final   Chloride 06/22/2022 104  98 - 111 mmol/L Final   CO2 06/22/2022 22  22 - 32 mmol/L Final   Glucose, Bld 06/22/2022 93  70 - 99 mg/dL Final   Glucose reference range applies only to samples taken after fasting for at least 8 hours.   BUN 06/22/2022 5 (L)  6 - 20 mg/dL Final   Creatinine, Ser 06/22/2022 0.74  0.44 - 1.00 mg/dL Final   Calcium 74/25/9563 9.2  8.9 - 10.3 mg/dL Final   Total Protein 87/56/4332 6.6  6.5 - 8.1 g/dL Final   Albumin 95/18/8416 3.9  3.5 - 5.0 g/dL Final   AST 60/63/0160 16  15 - 41 U/L Final   ALT 06/22/2022 16  0 - 44 U/L Final   Alkaline Phosphatase 06/22/2022 64  38 - 126 U/L Final   Total Bilirubin 06/22/2022 0.3  0.3 - 1.2 mg/dL Final   GFR, Estimated 06/22/2022 >60  >60 mL/min Final   Comment: (NOTE) Calculated using the CKD-EPI Creatinine Equation (2021)    Anion gap 06/22/2022 12  5 - 15 Final   Performed at [facility], [address] [place], [place] [address]   Alcohol, Ethyl (B) 06/22/2022 <10  <10 mg/dL Final   Comment: (NOTE) Lowest detectable limit for serum alcohol is 10  mg/dL.  For medical purposes only. Performed at [facility], [address], [place], [place] [address]    Preg Test, Ur 06/22/2022 Negative  Negative Final   POC Amphetamine UR 06/22/2022 None Detected  NONE DETECTED (Cut Off Level 1000 ng/mL) Final   POC Secobarbital (BAR) 06/22/2022 None Detected  NONE DETECTED (Cut Off Level 300 ng/mL) Final   POC Buprenorphine (BUP) 06/22/2022 None Detected  NONE DETECTED (Cut Off Level 10 ng/mL) Final   POC Oxazepam (BZO) 06/22/2022 Positive (A)  NONE DETECTED (Cut Off Level 300 ng/mL) Final   POC Cocaine UR 06/22/2022 None Detected  NONE DETECTED (Cut Off Level 300 ng/mL) Final   POC Methamphetamine UR 06/22/2022 None Detected  NONE DETECTED (Cut Off Level 1000 ng/mL) Final   POC Morphine 06/22/2022 None Detected  NONE DETECTED (Cut Off Level 300 ng/mL) Final   POC Methadone UR 06/22/2022 None Detected  NONE DETECTED (Cut Off Level 300 ng/mL) Final   POC Oxycodone UR 06/22/2022 None Detected  NONE DETECTED (Cut Off Level 100 ng/mL) Final   POC Marijuana UR  06/22/2022 None Detected  NONE DETECTED (Cut Off Level 50 ng/mL) Final  Admission on 06/16/2022, Discharged on 06/18/2022  Component Date Value Ref Range Status   Sodium 06/16/2022 139  135 - 145 mmol/L Final   Potassium 06/16/2022 3.5  3.5 - 5.1 mmol/L Final   Chloride 06/16/2022 109  98 - 111 mmol/L Final   CO2 06/16/2022 21 (L)  22 - 32 mmol/L Final   Glucose, Bld 06/16/2022 90  70 - 99 mg/dL Final   Glucose reference range applies only to samples taken after fasting for at least 8 hours.   BUN 06/16/2022 10  6 - 20 mg/dL Final   Creatinine, Ser 06/16/2022 0.71  0.44 - 1.00 mg/dL Final   Calcium 18/84/1660 8.7 (L)  8.9 - 10.3 mg/dL Final   Total Protein 63/02/6008 6.8  6.5 - 8.1 g/dL Final   Albumin 93/23/5573 3.7  3.5 - 5.0 g/dL Final   AST 22/03/5425 18  15 - 41 U/L Final   ALT 06/16/2022 18  0 - 44 U/L Final   Alkaline Phosphatase 06/16/2022 59  38 - 126 U/L Final   Total  Bilirubin 06/16/2022 0.4  0.3 - 1.2 mg/dL Final   GFR, Estimated 06/16/2022 >60  >60 mL/min Final   Comment: (NOTE) Calculated using the CKD-EPI Creatinine Equation (2021)    Anion gap 06/16/2022 9  5 - 15 Final   Performed at [facility], [address] [place], [place] [address]   Alcohol, Ethyl (B) 06/16/2022 <10  <10 mg/dL Final   Comment: (NOTE) Lowest detectable limit for serum alcohol is 10 mg/dL.  For medical purposes only. Performed at [facility], [address], [place], [place] [address]    Salicylate Lvl 06/16/2022 <7.0 (L)  7.0 - 30.0 mg/dL Final   Performed at [facility], [address] [place], [place] [address]   Acetaminophen (Tylenol), Serum 06/16/2022 <10 (L)  10 - 30 ug/mL Final   Comment: (NOTE) Therapeutic concentrations vary significantly. A range of 10-30 ug/mL  may be an effective concentration for many patients. However, some  are best treated at concentrations outside of this range. Acetaminophen concentrations >150 ug/mL at 4 hours after ingestion  and >50 ug/mL at 12 hours after ingestion are often associated with  toxic reactions.  Performed at [facility], [address], [place], [place] [address]    WBC 06/16/2022 10.6 (H)  4.0 - 10.5 K/uL Final   RBC 06/16/2022 4.14  3.87 - 5.11 MIL/uL Final   Hemoglobin 06/16/2022 10.3 (L)  12.0 - 15.0 g/dL Final   HCT 10/62/6948 33.5 (L)  36.0 - 46.0 % Final   MCV 06/16/2022 80.9  80.0 - 100.0 fL Final   MCH 06/16/2022 24.9 (L)  26.0 - 34.0 pg Final   MCHC 06/16/2022 30.7  30.0 - 36.0 g/dL Final   RDW 54/62/7035 14.4  11.5 - 15.5 % Final   Platelets 06/16/2022 276  150 - 400 K/uL Final   nRBC 06/16/2022 0.0  0.0 - 0.2 % Final   Performed at [facility], [address], [place], [place] [address]   Opiates 06/17/2022 NONE DETECTED  NONE DETECTED Final   Cocaine 06/17/2022 NONE DETECTED  NONE DETECTED Final    Benzodiazepines 06/17/2022 NONE DETECTED  NONE DETECTED Final   Amphetamines 06/17/2022 NONE DETECTED  NONE DETECTED Final   Tetrahydrocannabinol 06/17/2022 NONE DETECTED  NONE DETECTED Final   Barbiturates 06/17/2022 NONE DETECTED  NONE  DETECTED Final   Comment: (NOTE) DRUG SCREEN FOR MEDICAL PURPOSES ONLY.  IF CONFIRMATION IS NEEDED FOR ANY PURPOSE, NOTIFY LAB WITHIN 5 DAYS.  LOWEST DETECTABLE LIMITS FOR URINE DRUG SCREEN Drug Class                     Cutoff (ng/mL) Amphetamine and metabolites    1000 Barbiturate and metabolites    200 Benzodiazepine                 200 Opiates and metabolites        300 Cocaine and metabolites        300 THC                            50 Performed at [facility], [address], [place], [place] [address]    I-stat hCG, quantitative 06/16/2022 <5.0  <5 mIU/mL Final   Comment 3 06/16/2022          Final   Comment:   GEST. AGE      CONC.  (mIU/mL)   <=1 WEEK        5 - 50     2 WEEKS       50 - 500     3 WEEKS       100 - 10,000     4 WEEKS     1,000 - 30,000        FEMALE AND NON-PREGNANT FEMALE:     LESS THAN 5 mIU/mL   Admission on 06/04/2022, Discharged on 06/07/2022  Component Date Value Ref Range Status   Glucose-Capillary 06/04/2022 109 (H)  70 - 99 mg/dL Final   Glucose reference range applies only to samples taken after fasting for at least 8 hours.   Sodium 06/04/2022 138  135 - 145 mmol/L Final   Potassium 06/04/2022 3.3 (L)  3.5 - 5.1 mmol/L Final   Chloride 06/04/2022 108  98 - 111 mmol/L Final   CO2 06/04/2022 23  22 - 32 mmol/L Final   Glucose, Bld 06/04/2022 119 (H)  70 - 99 mg/dL Final   Glucose reference range applies only to samples taken after fasting for at least 8 hours.   BUN 06/04/2022 10  6 - 20 mg/dL Final   Creatinine, Ser 06/04/2022 0.79  0.44 - 1.00 mg/dL Final   Calcium 57/84/6962 8.7 (L)  8.9 - 10.3 mg/dL Final   Total Protein 95/28/4132 7.2  6.5 - 8.1 g/dL Final   Albumin 44/02/270  3.7  3.5 - 5.0 g/dL Final   AST 53/66/4403 19  15 - 41 U/L Final   ALT 06/04/2022 15  0 - 44 U/L Final   Alkaline Phosphatase 06/04/2022 68  38 - 126 U/L Final   Total Bilirubin 06/04/2022 0.5  0.3 - 1.2 mg/dL Final   GFR, Estimated 06/04/2022 >60  >60 mL/min Final   Comment: (NOTE) Calculated using the CKD-EPI Creatinine Equation (2021)    Anion gap 06/04/2022 7  5 - 15 Final   Performed at [facility], [address], [place], [place] [address]   Salicylate Lvl 06/04/2022 <7.0 (L)  7.0 - 30.0 mg/dL Final   Performed at [facility], [address], [place], [place] [address]   Acetaminophen (Tylenol), Serum 06/04/2022 <10 (L)  10 - 30 ug/mL Final   Comment: (NOTE) Therapeutic concentrations vary significantly. A range of 10-30 ug/mL  may  be an effective concentration for many patients. However, some  are best treated at concentrations outside of this range. Acetaminophen concentrations >150 ug/mL at 4 hours after ingestion  and >50 ug/mL at 12 hours after ingestion are often associated with  toxic reactions.  Performed at [facility], [address], [place], [place] [address]    Alcohol, Ethyl (B) 06/04/2022 <10  <10 mg/dL Final   Comment: (NOTE) Lowest detectable limit for serum alcohol is 10 mg/dL.  For medical purposes only. Performed at [facility], [address] [place], [place] [address]    Opiates 06/05/2022 NONE DETECTED  NONE DETECTED Final   Cocaine 06/05/2022 NONE DETECTED  NONE DETECTED Final   Benzodiazepines 06/05/2022 NONE DETECTED  NONE DETECTED Final   Amphetamines 06/05/2022 NONE DETECTED  NONE DETECTED Final   Tetrahydrocannabinol 06/05/2022 NONE DETECTED  NONE DETECTED Final   Barbiturates 06/05/2022 NONE DETECTED  NONE DETECTED Final   Comment: (NOTE) DRUG SCREEN FOR MEDICAL PURPOSES ONLY.  IF CONFIRMATION IS NEEDED FOR ANY PURPOSE, NOTIFY LAB WITHIN 5 DAYS.  LOWEST  DETECTABLE LIMITS FOR URINE DRUG SCREEN Drug Class                     Cutoff (ng/mL) Amphetamine and metabolites    1000 Barbiturate and metabolites    200 Benzodiazepine                 200 Opiates and metabolites        300 Cocaine and metabolites        300 THC                            50 Performed at [facility], [address], [place], [place] [address]    WBC 06/04/2022 6.6  4.0 - 10.5 K/uL Final   RBC 06/04/2022 4.32  3.87 - 5.11 MIL/uL Final   Hemoglobin 06/04/2022 10.9 (L)  12.0 - 15.0 g/dL Final   HCT 91/47/8295 34.6 (L)  36.0 - 46.0 % Final   MCV 06/04/2022 80.1  80.0 - 100.0 fL Final   MCH 06/04/2022 25.2 (L)  26.0 - 34.0 pg Final   MCHC 06/04/2022 31.5  30.0 - 36.0 g/dL Final   RDW 62/13/0865 14.1  11.5 - 15.5 % Final   Platelets 06/04/2022 254  150 - 400 K/uL Final   nRBC 06/04/2022 0.0  0.0 - 0.2 % Final   Neutrophils Relative % 06/04/2022 58  % Final   Neutro Abs 06/04/2022 3.8  1.7 - 7.7 K/uL Final   Lymphocytes Relative 06/04/2022 36  % Final   Lymphs Abs 06/04/2022 2.4  0.7 - 4.0 K/uL Final   Monocytes Relative 06/04/2022 5  % Final   Monocytes Absolute 06/04/2022 0.3  0.1 - 1.0 K/uL Final   Eosinophils Relative 06/04/2022 1  % Final   Eosinophils Absolute 06/04/2022 0.0  0.0 - 0.5 K/uL Final   Basophils Relative 06/04/2022 0  % Final   Basophils Absolute 06/04/2022 0.0  0.0 - 0.1 K/uL Final   Immature Granulocytes 06/04/2022 0  % Final   Abs Immature Granulocytes 06/04/2022 0.02  0.00 - 0.07 K/uL Final   Performed at [facility], [address], [place], [place] [address]   I-stat hCG, quantitative 06/05/2022 <5.0  <5 mIU/mL Final   Comment 3 06/05/2022          Final   Comment:  GEST. AGE      CONC.  (mIU/mL)   <=1 WEEK        5 - 50     2 WEEKS       50 - 500     3 WEEKS       100 - 10,000     4 WEEKS     1,000 - 30,000        FEMALE AND NON-PREGNANT FEMALE:     LESS THAN 5 mIU/mL    Sodium 06/05/2022 142   135 - 145 mmol/L Final   Potassium 06/05/2022 3.5  3.5 - 5.1 mmol/L Final   Chloride 06/05/2022 106  98 - 111 mmol/L Final   BUN 06/05/2022 10  6 - 20 mg/dL Final   Creatinine, Ser 06/05/2022 0.70  0.44 - 1.00 mg/dL Final   Glucose, Bld 46/27/0350 107 (H)  70 - 99 mg/dL Final   Glucose reference range applies only to samples taken after fasting for at least 8 hours.   Calcium, Ion 06/05/2022 1.24  1.15 - 1.40 mmol/L Final   TCO2 06/05/2022 26  22 - 32 mmol/L Final   Hemoglobin 06/05/2022 10.9 (L)  12.0 - 15.0 g/dL Final   HCT 09/38/1829 32.0 (L)  36.0 - 46.0 % Final   Magnesium 06/04/2022 2.1  1.7 - 2.4 mg/dL Final   Performed at [facility], [address], [place], [place] [address]   Acetaminophen (Tylenol), Serum 06/05/2022 <10 (L)  10 - 30 ug/mL Final   Comment: (NOTE) Therapeutic concentrations vary significantly. A range of 10-30 ug/mL  may be an effective concentration for many patients. However, some  are best treated at concentrations outside of this range. Acetaminophen concentrations >150 ug/mL at 4 hours after ingestion  and >50 ug/mL at 12 hours after ingestion are often associated with  toxic reactions.  Performed at [facility], [address] [place], [place] [address]    WBC 06/05/2022 7.1  4.0 - 10.5 K/uL Final   RBC 06/05/2022 4.11  3.87 - 5.11 MIL/uL Final   Hemoglobin 06/05/2022 10.5 (L)  12.0 - 15.0 g/dL Final   HCT 38/11/1749 33.8 (L)  36.0 - 46.0 % Final   MCV 06/05/2022 82.2  80.0 - 100.0 fL Final   MCH 06/05/2022 25.5 (L)  26.0 - 34.0 pg Final   MCHC 06/05/2022 31.1  30.0 - 36.0 g/dL Final   RDW 02/58/5277 14.5  11.5 - 15.5 % Final   Platelets 06/05/2022 259  150 - 400 K/uL Final   nRBC 06/05/2022 0.0  0.0 - 0.2 % Final   Performed at [facility], [address] [place], [place] [address]   Creatinine, Ser 06/05/2022 0.69  0.44 - 1.00 mg/dL Final   GFR, Estimated 06/05/2022 >60  >60 mL/min  Final   Comment: (NOTE) Calculated using the CKD-EPI Creatinine Equation (2021) Performed at [facility], [address], [place], [place] [address]    Vitamin B-12 06/05/2022 3,066 (H)  180 - 914 pg/mL Final   Comment: RESULT CONFIRMED BY MANUAL DILUTION (NOTE) This assay is not validated for testing neonatal or myeloproliferative syndrome specimens for Vitamin B12 levels. Performed at [facility], [address], [place], [place] [address]    Folate 06/05/2022 13.2  >5.9 ng/mL Final   Performed at [facility], [address] [place], [place] [address]   Iron 06/05/2022 35  28 - 170 ug/dL Final  TIBC 06/05/2022 396  250 - 450 ug/dL Final   Saturation Ratios 06/05/2022 9 (L)  10.4 - 31.8 % Final   UIBC 06/05/2022 361  ug/dL Final   Performed at [facility], [address] [place], [place] [address]   Ferritin 06/05/2022 12  11 - 307 ng/mL Final   Performed at [facility], [address], [place], [place] [address]   Retic Ct Pct 06/05/2022 1.6  0.4 - 3.1 % Final   RBC. 06/05/2022 4.14  3.87 - 5.11 MIL/uL Final   Retic Count, Absolute 06/05/2022 65.0  19.0 - 186.0 K/uL Final   Immature Retic Fract 06/05/2022 13.6  2.3 - 15.9 % Final   Performed at [facility], [address], [place], [place] [address]  Admission on 05/25/2022, Discharged on 05/28/2022  Component Date Value Ref Range Status   WBC 05/25/2022 6.9  4.0 - 10.5 K/uL Final   RBC 05/25/2022 4.40  3.87 - 5.11 MIL/uL Final   Hemoglobin 05/25/2022 11.1 (L)  12.0 - 15.0 g/dL Final   HCT 91/47/8295 35.8 (L)  36.0 - 46.0 % Final   MCV 05/25/2022 81.4  80.0 - 100.0 fL Final   MCH 05/25/2022 25.2 (L)  26.0 - 34.0 pg Final   MCHC 05/25/2022 31.0  30.0 - 36.0 g/dL Final   RDW 62/13/0865 14.5  11.5 - 15.5 % Final   Platelets 05/25/2022 277  150 - 400 K/uL Final   nRBC 05/25/2022 0.0  0.0 - 0.2 % Final    Neutrophils Relative % 05/25/2022 59  % Final   Neutro Abs 05/25/2022 4.0  1.7 - 7.7 K/uL Final   Lymphocytes Relative 05/25/2022 33  % Final   Lymphs Abs 05/25/2022 2.3  0.7 - 4.0 K/uL Final   Monocytes Relative 05/25/2022 6  % Final   Monocytes Absolute 05/25/2022 0.4  0.1 - 1.0 K/uL Final   Eosinophils Relative 05/25/2022 1  % Final   Eosinophils Absolute 05/25/2022 0.0  0.0 - 0.5 K/uL Final   Basophils Relative 05/25/2022 1  % Final   Basophils Absolute 05/25/2022 0.0  0.0 - 0.1 K/uL Final   Immature Granulocytes 05/25/2022 0  % Final   Abs Immature Granulocytes 05/25/2022 0.03  0.00 - 0.07 K/uL Final   Performed at [facility], [address] [place], [place] [address]   Sodium 05/25/2022 138  135 - 145 mmol/L Final   Potassium 05/25/2022 3.8  3.5 - 5.1 mmol/L Final   Chloride 05/25/2022 110  98 - 111 mmol/L Final   CO2 05/25/2022 19 (L)  22 - 32 mmol/L Final   Glucose, Bld 05/25/2022 104 (H)  70 - 99 mg/dL Final   Glucose reference range applies only to samples taken after fasting for at least 8 hours.   BUN 05/25/2022 6  6 - 20 mg/dL Final   Creatinine, Ser 05/25/2022 0.77  0.44 - 1.00 mg/dL Final   Calcium 62/95/2841 8.8 (L)  8.9 - 10.3 mg/dL Final   Total Protein 32/44/0102 6.7  6.5 - 8.1 g/dL Final   Albumin 72/53/6644 3.6  3.5 - 5.0 g/dL Final   AST 03/47/4259 18  15 - 41 U/L Final   ALT 05/25/2022 15  0 - 44 U/L Final   Alkaline Phosphatase 05/25/2022 72  38 - 126 U/L Final   Total Bilirubin 05/25/2022 0.3  0.3 - 1.2 mg/dL Final   GFR, Estimated 05/25/2022 >60  >60 mL/min Final   Comment: (NOTE)  Calculated using the CKD-EPI Creatinine Equation (2021)    Anion gap 05/25/2022 9  5 - 15 Final   Performed at [facility], [address] [place], [place] [address]   Alcohol, Ethyl (B) 05/25/2022 <10  <10 mg/dL Final   Comment: (NOTE) Lowest detectable limit for serum alcohol is 10 mg/dL.  For medical purposes only. Performed at [facility],  [address], [place], [place] [address]    Salicylate Lvl 05/25/2022 <7.0 (L)  7.0 - 30.0 mg/dL Final   Performed at [facility], [address], [place], [place] [address]   Acetaminophen (Tylenol), Serum 05/25/2022 <10 (L)  10 - 30 ug/mL Final   Comment: (NOTE) Therapeutic concentrations vary significantly. A range of 10-30 ug/mL  may be an effective concentration for many patients. However, some  are best treated at concentrations outside of this range. Acetaminophen concentrations >150 ug/mL at 4 hours after ingestion  and >50 ug/mL at 12 hours after ingestion are often associated with  toxic reactions.  Performed at [facility], [address] [place], [place] [address]    Opiates 05/25/2022 NONE DETECTED  NONE DETECTED Final   Cocaine 05/25/2022 NONE DETECTED  NONE DETECTED Final   Benzodiazepines 05/25/2022 NONE DETECTED  NONE DETECTED Final   Amphetamines 05/25/2022 NONE DETECTED  NONE DETECTED Final   Tetrahydrocannabinol 05/25/2022 NONE DETECTED  NONE DETECTED Final   Barbiturates 05/25/2022 NONE DETECTED  NONE DETECTED Final   Comment: (NOTE) DRUG SCREEN FOR MEDICAL PURPOSES ONLY.  IF CONFIRMATION IS NEEDED FOR ANY PURPOSE, NOTIFY LAB WITHIN 5 DAYS.  LOWEST DETECTABLE LIMITS FOR URINE DRUG SCREEN Drug Class                     Cutoff (ng/mL) Amphetamine and metabolites    1000 Barbiturate and metabolites    200 Benzodiazepine                 200 Opiates and metabolites        300 Cocaine and metabolites        300 THC                            50 Performed at [facility], [address], [place], [place] [address]    I-stat hCG, quantitative 05/25/2022 <5.0  <5 mIU/mL Final   Comment 3 05/25/2022          Final   Comment:   GEST. AGE      CONC.  (mIU/mL)   <=1 WEEK        5 - 50     2 WEEKS       50 - 500     3 WEEKS       100 - 10,000     4 WEEKS     1,000 - 30,000        FEMALE AND NON-PREGNANT FEMALE:     LESS THAN 5 mIU/mL     Acetaminophen (Tylenol), Serum 05/25/2022 <10 (L)  10 - 30 ug/mL Final   Comment: (NOTE) Therapeutic concentrations vary significantly. A range of 10-30 ug/mL  may be an effective concentration for many patients. However, some  are best treated at concentrations outside of this range. Acetaminophen concentrations >150 ug/mL at 4 hours after ingestion  and >50 ug/mL at 12 hours after ingestion are often associated with  toxic reactions.  Performed  at [facility], [address], [place], [place] [address]    SARS Coronavirus 2 by RT PCR 05/27/2022 NEGATIVE  NEGATIVE Final   Influenza A by PCR 05/27/2022 NEGATIVE  NEGATIVE Final   Influenza B by PCR 05/27/2022 NEGATIVE  NEGATIVE Final   Comment: (NOTE) The Xpert Xpress SARS-CoV-2/FLU/RSV plus assay is intended as an aid in the diagnosis of influenza from Nasopharyngeal swab specimens and should not be used as a sole basis for treatment. Nasal washings and aspirates are unacceptable for Xpert Xpress SARS-CoV-2/FLU/RSV testing.  Fact Sheet for Patients: BloggerCourse.com  Fact Sheet for Healthcare Providers: SeriousBroker.it  This test is not yet approved or cleared by the [place] FDA and has been authorized for detection and/or diagnosis of SARS-CoV-2 by FDA under an Emergency Use Authorization (EUA). This EUA will remain in effect (meaning this test can be used) for the duration of the COVID-19 declaration under Section 564(b)(1) of the Act, 21 U.S.C. section 360bbb-3(b)(1), unless the authorization is terminated or revoked.     Resp Syncytial Virus by PCR 05/27/2022 NEGATIVE  NEGATIVE Final   Comment: (NOTE) Fact Sheet for Patients: BloggerCourse.com  Fact Sheet for Healthcare Providers: SeriousBroker.it  This test is not yet approved or cleared by the [place] FDA and has been authorized for detection  and/or diagnosis of SARS-CoV-2 by FDA under an Emergency Use Authorization (EUA). This EUA will remain in effect (meaning this test can be used) for the duration of the COVID-19 declaration under Section 564(b)(1) of the Act, 21 U.S.C. section 360bbb-3(b)(1), unless the authorization is terminated or revoked.  Performed at [facility], [address] [place], [place] [address]   Admission on 05/19/2022, Discharged on 05/21/2022  Component Date Value Ref Range Status   Sodium 05/19/2022 136  135 - 145 mmol/L Final   Potassium 05/19/2022 3.7  3.5 - 5.1 mmol/L Final   Chloride 05/19/2022 107  98 - 111 mmol/L Final   CO2 05/19/2022 22  22 - 32 mmol/L Final   Glucose, Bld 05/19/2022 99  70 - 99 mg/dL Final   Glucose reference range applies only to samples taken after fasting for at least 8 hours.   BUN 05/19/2022 9  6 - 20 mg/dL Final   Creatinine, Ser 05/19/2022 0.70  0.44 - 1.00 mg/dL Final   Calcium 40/11/2723 8.7 (L)  8.9 - 10.3 mg/dL Final   Total Protein 36/64/4034 7.3  6.5 - 8.1 g/dL Final   Albumin 74/25/9563 3.9  3.5 - 5.0 g/dL Final   AST 87/56/4332 21  15 - 41 U/L Final   ALT 05/19/2022 17  0 - 44 U/L Final   Alkaline Phosphatase 05/19/2022 72  38 - 126 U/L Final   Total Bilirubin 05/19/2022 0.4  0.3 - 1.2 mg/dL Final   GFR, Estimated 05/19/2022 >60  >60 mL/min Final   Comment: (NOTE) Calculated using the CKD-EPI Creatinine Equation (2021)    Anion gap 05/19/2022 7  5 - 15 Final   Performed at [facility], [address] [place], [place] [address]   Acetaminophen (Tylenol), Serum 05/19/2022 <10 (L)  10 - 30 ug/mL Final   Comment: (NOTE) Therapeutic concentrations vary significantly. A range of 10-30 ug/mL  may be an effective concentration for many patients. However, some  are best treated at concentrations outside of this range. Acetaminophen concentrations >150 ug/mL at 4 hours after ingestion  and >50 ug/mL at 12 hours after ingestion are  often associated with  toxic reactions.  Performed at [facility], [address] [place], [place] [address]    Salicylate Lvl 05/19/2022 <7.0 (L)  7.0 - 30.0 mg/dL Final   Performed at [facility], [address], [place], [place] [address]   WBC 05/19/2022 10.1  4.0 - 10.5 K/uL Final   RBC 05/19/2022 5.13 (H)  3.87 - 5.11 MIL/uL Final   Hemoglobin 05/19/2022 12.8  12.0 - 15.0 g/dL Final   HCT 25/36/6440 41.6  36.0 - 46.0 % Final   MCV 05/19/2022 81.1  80.0 - 100.0 fL Final   MCH 05/19/2022 25.0 (L)  26.0 - 34.0 pg Final   MCHC 05/19/2022 30.8  30.0 - 36.0 g/dL Final   RDW 34/74/2595 14.6  11.5 - 15.5 % Final   Platelets 05/19/2022 328  150 - 400 K/uL Final   nRBC 05/19/2022 0.0  0.0 - 0.2 % Final   Neutrophils Relative % 05/19/2022 66  % Final   Neutro Abs 05/19/2022 6.6  1.7 - 7.7 K/uL Final   Lymphocytes Relative 05/19/2022 28  % Final   Lymphs Abs 05/19/2022 2.8  0.7 - 4.0 K/uL Final   Monocytes Relative 05/19/2022 5  % Final   Monocytes Absolute 05/19/2022 0.5  0.1 - 1.0 K/uL Final   Eosinophils Relative 05/19/2022 1  % Final   Eosinophils Absolute 05/19/2022 0.1  0.0 - 0.5 K/uL Final   Basophils Relative 05/19/2022 0  % Final   Basophils Absolute 05/19/2022 0.0  0.0 - 0.1 K/uL Final   Immature Granulocytes 05/19/2022 0  % Final   Abs Immature Granulocytes 05/19/2022 0.02  0.00 - 0.07 K/uL Final   Performed at [facility], [address] [place], [place] [address]   Magnesium 05/19/2022 1.9  1.7 - 2.4 mg/dL Final   Performed at [facility], [address] [place], [place] [address]   Alcohol, Ethyl (B) 05/19/2022 <10  <10 mg/dL Final   Comment: (NOTE) Lowest detectable limit for serum alcohol is 10 mg/dL.  For medical purposes only. Performed at [facility], [address] [place], [place] [address]    I-stat hCG, quantitative 05/19/2022 <5.0  <5 mIU/mL Final    Comment 3 05/19/2022          Final   Comment:   GEST. AGE      CONC.  (mIU/mL)   <=1 WEEK        5 - 50     2 WEEKS       50 - 500     3 WEEKS       100 - 10,000     4 WEEKS     1,000 - 30,000        FEMALE AND NON-PREGNANT FEMALE:     LESS THAN 5 mIU/mL    Opiates 05/19/2022 NONE DETECTED  NONE DETECTED Final   Cocaine 05/19/2022 NONE DETECTED  NONE DETECTED Final   Benzodiazepines 05/19/2022 NONE DETECTED  NONE DETECTED Final   Amphetamines 05/19/2022 NONE DETECTED  NONE DETECTED Final   Tetrahydrocannabinol 05/19/2022 NONE DETECTED  NONE DETECTED Final   Barbiturates 05/19/2022 NONE DETECTED  NONE DETECTED Final   Comment: (NOTE) DRUG SCREEN FOR MEDICAL PURPOSES ONLY.  IF CONFIRMATION IS NEEDED FOR ANY PURPOSE, NOTIFY LAB WITHIN 5 DAYS.  LOWEST DETECTABLE LIMITS FOR URINE DRUG SCREEN Drug Class  Cutoff (ng/mL) Amphetamine and metabolites    1000 Barbiturate and metabolites    200 Benzodiazepine                 200 Opiates and metabolites        300 Cocaine and metabolites        300 THC                            50 Performed at [facility], [address], [place], [place] [address]    Acetaminophen (Tylenol), Serum 05/19/2022 <10 (L)  10 - 30 ug/mL Final   Comment: (NOTE) Therapeutic concentrations vary significantly. A range of 10-30 ug/mL  may be an effective concentration for many patients. However, some  are best treated at concentrations outside of this range. Acetaminophen concentrations >150 ug/mL at 4 hours after ingestion  and >50 ug/mL at 12 hours after ingestion are often associated with  toxic reactions.  Performed at [facility], [address], [place], [place] [address]    Sodium 05/19/2022 134 (L)  135 - 145 mmol/L Final   Potassium 05/19/2022 3.3 (L)  3.5 - 5.1 mmol/L Final   Chloride 05/19/2022 107  98 - 111 mmol/L Final   CO2 05/19/2022 23  22 - 32 mmol/L Final   Glucose, Bld 05/19/2022  103 (H)  70 - 99 mg/dL Final   Glucose reference range applies only to samples taken after fasting for at least 8 hours.   BUN 05/19/2022 8  6 - 20 mg/dL Final   Creatinine, Ser 05/19/2022 0.79  0.44 - 1.00 mg/dL Final   Calcium 69/62/9528 8.3 (L)  8.9 - 10.3 mg/dL Final   GFR, Estimated 05/19/2022 >60  >60 mL/min Final   Comment: (NOTE) Calculated using the CKD-EPI Creatinine Equation (2021)    Anion gap 05/19/2022 4 (L)  5 - 15 Final   Performed at [facility], [address] [place], [place] [address]   Magnesium 05/19/2022 2.3  1.7 - 2.4 mg/dL Final   Performed at [facility], [address] [place], [place] [address]   SARS Coronavirus 2 by RT PCR 05/21/2022 NEGATIVE  NEGATIVE Final   Comment: (NOTE) SARS-CoV-2 target nucleic acids are NOT DETECTED.  The SARS-CoV-2 RNA is generally detectable in upper and lower respiratory specimens during the acute phase of infection. The lowest concentration of SARS-CoV-2 viral copies this assay can detect is 250 copies / mL. A negative result does not preclude SARS-CoV-2 infection and should not be used as the sole basis for treatment or other patient management decisions.  A negative result may occur with improper specimen collection / handling, submission of specimen other than nasopharyngeal swab, presence of viral mutation(s) within the areas targeted by this assay, and inadequate number of viral copies (<250 copies / mL). A negative result must be combined with clinical observations, patient history, and epidemiological information.  Fact Sheet for Patients:   RoadLapTop.co.za  Fact Sheet for Healthcare Providers: http://kim-miller.com/  This test is not yet approved or                           cleared by the [place] FDA and has been authorized for detection and/or diagnosis of SARS-CoV-2 by FDA under an Emergency Use Authorization (EUA).   This EUA will remain in effect (meaning this test can be used) for the  duration of the COVID-19 declaration under Section 564(b)(1) of the Act, 21 U.S.C. section 360bbb-3(b)(1), unless the authorization is terminated or revoked sooner.  Performed at [facility], [address] [place], [place] [address]   Admission on 05/15/2022, Discharged on 05/16/2022  Component Date Value Ref Range Status   SARS Coronavirus 2 by RT PCR 05/15/2022 NEGATIVE  NEGATIVE Final   Performed at [facility], [address], [place], [place] [address]   SARSCOV2ONAVIRUS 2 AG 05/16/2022 NEGATIVE  NEGATIVE Final   Comment: (NOTE) SARS-CoV-2 antigen NOT DETECTED.   Negative results are presumptive.  Negative results do not preclude SARS-CoV-2 infection and should not be used as the sole basis for treatment or other patient management decisions, including infection  control decisions, particularly in the presence of clinical signs and  symptoms consistent with COVID-19, or in those who have been in contact with the virus.  Negative results must be combined with clinical observations, patient history, and epidemiological information. The expected result is Negative.  Fact Sheet for Patients: https://www.jennings-kim.com/  Fact Sheet for Healthcare Providers: https://alexander-rogers.biz/  This test is not yet approved or cleared by the [place] FDA and  has been authorized for detection and/or diagnosis of SARS-CoV-2 by FDA under an Emergency Use Authorization (EUA).  This EUA will remain in effect (meaning this test can be used) for the duration of  the COV                          ID-19 declaration under Section 564(b)(1) of the Act, 21 U.S.C. section 360bbb-3(b)(1), unless the authorization is terminated or revoked sooner.    There may be more visits with results that are not included.    Allergies: Bee venom, Coconut flavor, Fish allergy,  Geodon [ziprasidone hcl], Haloperidol and related, Lithobid [lithium], Roxicodone [oxycodone], Seroquel [quetiapine], Shellfish allergy, Phenergan [promethazine hcl], Prilosec [omeprazole], Sulfa antibiotics, Tegretol [carbamazepine], Prozac [fluoxetine], Tape, and Tylenol [acetaminophen]  Medications:  Facility Ordered Medications  Medication   alum & mag hydroxide-simeth (MAALOX/MYLANTA) 200-200-20 MG/5ML suspension 30 mL   magnesium hydroxide (MILK OF MAGNESIA) suspension 30 mL   traZODone (DESYREL) tablet 50 mg   albuterol (VENTOLIN HFA) 108 (90 Base) MCG/ACT inhaler 2 puff   cyclobenzaprine (FLEXERIL) tablet 10 mg   escitalopram (LEXAPRO) tablet 20 mg   gabapentin (NEURONTIN) capsule 600 mg   prazosin (MINIPRESS) capsule 1 mg   pantoprazole (PROTONIX) EC tablet 40 mg   melatonin tablet 3 mg   OLANZapine zydis (ZYPREXA) disintegrating tablet 10 mg   PTA Medications  Medication Sig   albuterol (VENTOLIN HFA) 108 (90 Base) MCG/ACT inhaler Inhale 2 puffs into the lungs every 6 (six) hours as needed for wheezing or shortness of breath.   BREYNA 160-4.5 MCG/ACT inhaler Inhale 1 puff into the lungs in the morning and at bedtime.   traZODone (DESYREL) 50 MG tablet Take 1 tablet (50 mg total) by mouth at bedtime as needed for up to 7 days for sleep. (Patient taking differently: Take 50 mg by mouth at bedtime.)   cyclobenzaprine (FLEXERIL) 10 MG tablet Take 10 mg by mouth at bedtime.   diclofenac Sodium (VOLTAREN) 1 % GEL Apply 2 g topically 2 (two) times daily.   hydrOXYzine (ATARAX) 25 MG tablet Take 1 tablet (25 mg total) by mouth 3 (three) times daily as needed for anxiety.   melatonin 3 MG TABS tablet Take 1 tablet (3 mg total) by mouth at bedtime.  pantoprazole (PROTONIX) 40 MG tablet Take 40 mg by mouth daily.   prazosin (MINIPRESS) 1 MG capsule Take 1 mg by mouth at bedtime.   escitalopram (LEXAPRO) 20 MG tablet Take 1 tablet (20 mg total) by mouth daily.   OLANZapine zydis (ZYPREXA)  10 MG disintegrating tablet Take 1 tablet (10 mg total) by mouth at bedtime.   gabapentin (NEURONTIN) 600 MG tablet Take 600 mg by mouth 3 (three) times daily.   benztropine (COGENTIN) 1 MG tablet Take 1 tablet (1 mg total) by mouth at bedtime for 7 doses.      Medical Decision Making  Javiana E. Tonkinson is a 32 y/o single female with a history of schizoaffective disorder bipolar type, generalized anxiety disorder, autism spectrum disorder, PTSD, borderline personality disorder and chronic suicidal ideations presenting voluntarily to [facility] for suicidal ideations brought in tonight by [employer].  Patient presented earlier at [facility], ED for chest pains and after being evaluated by the ED staff patient was discharged.  After patient was discharged was still at the ED patient stated that she was suicidal and was told by ED staff that she needed to come to [facility] to be evaluated.    Recommendations  Based on my evaluation the patient does not appear to have an emergency medical condition.  Patient will be admitted to [facility] continuous assessment for crisis management, stabilization and safety.  Jasper Riling, NP 07/29/22  6:48 AM

## 2022-07-29 NOTE — Discharge Instructions (Addendum)
Your workup today is not consistent with a heart attack.  As it hurts you when I put you in the chest most likely this is the muscles of the chest wall that hurts you.  Please follow-up with your family doctor in the office.  Take 4 over the counter ibuprofen tablets 3 times a day or 2 over-the-counter naproxen tablets twice a day for pain. Also take tylenol 1000mg (2 extra strength) four times a day.

## 2022-07-30 ENCOUNTER — Encounter (HOSPITAL_COMMUNITY): Payer: Self-pay

## 2022-07-30 ENCOUNTER — Encounter (HOSPITAL_COMMUNITY): Payer: Self-pay | Admitting: Psychiatry

## 2022-07-30 MED ORDER — POLYETHYLENE GLYCOL 3350 17 G PO PACK
17.0000 g | PACK | Freq: Every day | ORAL | Status: DC
  Administered 2022-07-30 – 2022-08-01 (×2): 17 g via ORAL
  Filled 2022-07-30 (×5): qty 1

## 2022-07-30 MED ORDER — MOMETASONE FURO-FORMOTEROL FUM 200-5 MCG/ACT IN AERO
2.0000 | INHALATION_SPRAY | Freq: Two times a day (BID) | RESPIRATORY_TRACT | Status: DC
  Administered 2022-07-30 – 2022-08-02 (×7): 2 via RESPIRATORY_TRACT
  Filled 2022-07-30: qty 8.8

## 2022-07-30 MED ORDER — BENZOCAINE 10 % MT GEL
Freq: Two times a day (BID) | OROMUCOSAL | Status: DC | PRN
  Filled 2022-07-30: qty 9

## 2022-07-30 MED ORDER — METOPROLOL SUCCINATE ER 25 MG PO TB24
25.0000 mg | ORAL_TABLET | Freq: Every day | ORAL | Status: DC
  Filled 2022-07-30 (×2): qty 1

## 2022-07-30 NOTE — BH IP Treatment Plan (Signed)
Interdisciplinary Treatment and Diagnostic Plan Update  07/30/2022 Time of Session: 11:10 AM  [name] MRN: [id number]  Principal Diagnosis: Schizoaffective disorder, depressive type (HCC)  Secondary Diagnoses: Principal Problem:   Schizoaffective disorder, depressive type (HCC) Active Problems:   Borderline personality disorder (HCC)   Asperger syndrome   PTSD (post-traumatic stress disorder)   GAD (generalized anxiety disorder)   Self-injurious behavior   Fibromyalgia   Current Medications:  Current Facility-Administered Medications  Medication Dose Route Frequency Provider Last Rate Last Admin   albuterol (VENTOLIN HFA) 108 (90 Base) MCG/ACT inhaler 2 puff  2 puff Inhalation Q6H PRN Ardis Hughs, NP       alum & mag hydroxide-simeth (MAALOX/MYLANTA) 200-200-20 MG/5ML suspension 30 mL  30 mL Oral Q4H PRN Ardis Hughs, NP       benzocaine (ORAJEL) 10 % mucosal gel   Mouth/Throat BID PRN Princess Bruins, DO       cyclobenzaprine (FLEXERIL) tablet 10 mg  10 mg Oral QHS Ardis Hughs, NP   10 mg at 07/29/22 2058   escitalopram (LEXAPRO) tablet 20 mg  20 mg Oral Daily Ardis Hughs, NP   20 mg at 07/30/22 4782   gabapentin (NEURONTIN) capsule 600 mg  600 mg Oral TID Ardis Hughs, NP   600 mg at 07/30/22 1205   hydrOXYzine (ATARAX) tablet 25 mg  25 mg Oral TID PRN Ardis Hughs, NP       OLANZapine zydis (ZYPREXA) disintegrating tablet 5 mg  5 mg Oral Q8H PRN Ardis Hughs, NP       And   LORazepam (ATIVAN) tablet 1 mg  1 mg Oral PRN Ardis Hughs, NP       magnesium hydroxide (MILK OF MAGNESIA) suspension 30 mL  30 mL Oral Daily PRN Ardis Hughs, NP       melatonin tablet 3 mg  3 mg Oral QHS Ardis Hughs, NP       mometasone-formoterol (DULERA) 200-5 MCG/ACT inhaler 2 puff  2 puff Inhalation BID Princess Bruins, DO       OLANZapine zydis (ZYPREXA) disintegrating tablet 10 mg  10 mg Oral QHS Vernard Gambles H, NP   10 mg  at 07/29/22 2100   pantoprazole (PROTONIX) EC tablet 40 mg  40 mg Oral Daily Ardis Hughs, NP   40 mg at 07/30/22 9562   prazosin (MINIPRESS) capsule 1 mg  1 mg Oral QHS Ardis Hughs, NP   1 mg at 07/29/22 2059   traZODone (DESYREL) tablet 50 mg  50 mg Oral QHS PRN Ardis Hughs, NP       PTA Medications: Medications Prior to Admission  Medication Sig Dispense Refill Last Dose   albuterol (VENTOLIN HFA) 108 (90 Base) MCG/ACT inhaler Inhale 2 puffs into the lungs every 6 (six) hours as needed for wheezing or shortness of breath.      benztropine (COGENTIN) 1 MG tablet Take 1 tablet (1 mg total) by mouth at bedtime for 7 doses. 7 tablet 0    BREYNA 160-4.5 MCG/ACT inhaler Inhale 1 puff into the lungs in the morning and at bedtime.      cyclobenzaprine (FLEXERIL) 10 MG tablet Take 10 mg by mouth at bedtime.      diclofenac Sodium (VOLTAREN) 1 % GEL Apply 2 g topically 2 (two) times daily. 2 g 0    escitalopram (LEXAPRO) 20 MG tablet Take 1 tablet (20 mg total) by mouth daily.  gabapentin (NEURONTIN) 600 MG tablet Take 600 mg by mouth 3 (three) times daily.      hydrOXYzine (ATARAX) 25 MG tablet Take 1 tablet (25 mg total) by mouth 3 (three) times daily as needed for anxiety. 30 tablet 0    OLANZapine zydis (ZYPREXA) 10 MG disintegrating tablet Take 1 tablet (10 mg total) by mouth at bedtime.      pantoprazole (PROTONIX) 40 MG tablet Take 40 mg by mouth daily.      prazosin (MINIPRESS) 1 MG capsule Take 1 mg by mouth at bedtime.       Patient Stressors: Health problems   Loss of Custody of daughter    Patient Strengths: Ability for insight  Average or above average intelligence  Capable of independent living  Communication skills  General fund of knowledge  Motivation for treatment/growth   Treatment Modalities: Medication Management, Group therapy, Case management,  1 to 1 session with clinician, Psychoeducation, Recreational therapy.   Physician Treatment Plan  for Primary Diagnosis: Schizoaffective disorder, depressive type (HCC) Long Term Goal(s):     Short Term Goals:    Medication Management: Evaluate patient's response, side effects, and tolerance of medication regimen.  Therapeutic Interventions: 1 to 1 sessions, Unit Group sessions and Medication administration.  Evaluation of Outcomes: Not Progressing  Physician Treatment Plan for Secondary Diagnosis: Principal Problem:   Schizoaffective disorder, depressive type (HCC) Active Problems:   Borderline personality disorder (HCC)   Asperger syndrome   PTSD (post-traumatic stress disorder)   GAD (generalized anxiety disorder)   Self-injurious behavior   Fibromyalgia  Long Term Goal(s):     Short Term Goals:       Medication Management: Evaluate patient's response, side effects, and tolerance of medication regimen.  Therapeutic Interventions: 1 to 1 sessions, Unit Group sessions and Medication administration.  Evaluation of Outcomes: Not Progressing   RN Treatment Plan for Primary Diagnosis: Schizoaffective disorder, depressive type (HCC) Long Term Goal(s): Knowledge of disease and therapeutic regimen to maintain health will improve  Short Term Goals: Ability to remain free from injury will improve, Ability to verbalize frustration and anger appropriately will improve, Ability to demonstrate self-control, Ability to participate in decision making will improve, Ability to verbalize feelings will improve, Ability to disclose and discuss suicidal ideas, Ability to identify and develop effective coping behaviors will improve, and Compliance with prescribed medications will improve  Medication Management: RN will administer medications as ordered by provider, will assess and evaluate patient's response and provide education to patient for prescribed medication. RN will report any adverse and/or side effects to prescribing provider.  Therapeutic Interventions: 1 on 1 counseling sessions,  Psychoeducation, Medication administration, Evaluate responses to treatment, Monitor vital signs and CBGs as ordered, Perform/monitor CIWA, COWS, AIMS and Fall Risk screenings as ordered, Perform wound care treatments as ordered.  Evaluation of Outcomes: Not Progressing   LCSW Treatment Plan for Primary Diagnosis: Schizoaffective disorder, depressive type (HCC) Long Term Goal(s): Safe transition to appropriate next level of care at discharge, Engage patient in therapeutic group addressing interpersonal concerns.  Short Term Goals: Engage patient in aftercare planning with referrals and resources, Increase social support, Increase ability to appropriately verbalize feelings, Increase emotional regulation, Facilitate acceptance of mental health diagnosis and concerns, Facilitate patient progression through stages of change regarding substance use diagnoses and concerns, Identify triggers associated with mental health/substance abuse issues, and Increase skills for wellness and recovery  Therapeutic Interventions: Assess for all discharge needs, 1 to 1 time with Social worker, Explore available  resources and support systems, Assess for adequacy in community support network, Educate family and significant other(s) on suicide prevention, Complete Psychosocial Assessment, Interpersonal group therapy.  Evaluation of Outcomes: Not Progressing   Progress in Treatment: Attending groups: No. Participating in groups: No. Taking medication as prescribed: Yes. Toleration medication: Yes. Family/Significant other contact made: No, will contact:  [facility] CSTLupita Ward [phone]  Patient understands diagnosis: No. Discussing patient identified problems/goals with staff: Yes. Medical problems stabilized or resolved: Yes. Denies suicidal/homicidal ideation: Yes. Issues/concerns per patient self-inventory: No.   New problem(s) identified: No, Describe:  None reported  New Short Term/Long Term  Goal(s):  medication stabilization, elimination of SI thoughts, development of comprehensive mental wellness plan.    Patient Goals:  " Stop my suicidal thoughts and auditory hallucinations of being told to throw things or hurt myself"   Discharge Plan or Barriers: Patient recently admitted. CSW will continue to follow and assess for appropriate referrals and possible discharge planning.    Reason for Continuation of Hospitalization: Anxiety Depression Hallucinations Medication stabilization Suicidal ideation  Estimated Length of Stay: 5-7 days   Last 3 [place] Suicide Severity Risk Score: Flowsheet Row [facility] Most recent reading at 07/29/2022  9:10 PM ED from 07/29/2022 in [facility] Most recent reading at 07/29/2022  6:34 AM ED from 07/29/2022 in [facility] Most recent reading at 07/29/2022  4:15 AM  C-SSRS RISK CATEGORY High Risk High Risk Error: Q2 is Yes, you must answer 3, 4, and 5       Last PHQ 2/9 Scores:    03/10/2022   10:01 PM 03/10/2022    9:56 PM 02/10/2022    1:49 AM  Depression screen PHQ 2/9  Decreased Interest 1 1 2   Down, Depressed, Hopeless 1 1 2   PHQ - 2 Score 2 2 4   Altered sleeping 1 1 2   Tired, decreased energy 1 1 2   Change in appetite 1 0 2  Feeling bad or failure about yourself  1 1 2   Trouble concentrating 1 1 2   Moving slowly or fidgety/restless 1 1 1   Suicidal thoughts 1 1 1   PHQ-9 Score 9 8 16   Difficult doing work/chores Somewhat difficult  Very difficult    Scribe for Treatment Team: Isabella Bowens, LCSWA 07/30/2022 1:32 PM

## 2022-07-30 NOTE — Progress Notes (Signed)
   07/30/22 0800  Psych Admission Type (Psych Patients Only)  Admission Status Voluntary  Psychosocial Assessment  Patient Complaints Anxiety;Depression;Hopelessness  Eye Contact Fair  Facial Expression Anxious  Affect Anxious;Depressed  Speech Logical/coherent  Interaction Assertive  Motor Activity Slow  Appearance/Hygiene Unremarkable  Behavior Characteristics Cooperative;Appropriate to situation  Mood Depressed;Anxious  Aggressive Behavior  Targets Self  Effect Self-harm  Thought Process  Coherency WDL  Delusions WDL  Perception WDL  Hallucination None reported or observed  Judgment Limited  Confusion None  Danger to Self  Current suicidal ideation? Passive  Description of Suicide Plan Contract  Self-Injurious Behavior No self-injurious ideation or behavior indicators observed or expressed   Agreement Not to Harm Self Yes  Description of Agreement Verbal  Danger to Others  Danger to Others None reported or observed  Danger to Others Abnormal  Harmful Behavior to others No threats or harm toward other people  Destructive Behavior No threats or harm toward property

## 2022-07-30 NOTE — Group Note (Signed)
Date:  07/30/2022 Time:  9:14 PM  Group Topic/Focus:  Wrap-Up Group:   The focus of this group is to help patients review their daily goal of treatment and discuss progress on daily workbooks.    Participation Level:  Active  Participation Quality:  Appropriate  Affect:  Appropriate  Cognitive:  Appropriate  Insight: Appropriate  Engagement in Group:  Engaged  Modes of Intervention:  Education and Exploration  Additional Comments:  Patient attended and participated in group tonight. Se reports that she like that she is smart, creative and a  good mother.  Lita Mains [facility] 07/30/2022, 9:14 PM

## 2022-07-30 NOTE — Group Note (Signed)
Date:  07/30/2022 Time:  9:51 AM  Group Topic/Focus:  Goals Group:   The focus of this group is to help patients establish daily goals to achieve during treatment and discuss how the patient can incorporate goal setting into their daily lives to aide in recovery.    Participation Level:  Did Not Attend   Donell Beers 07/30/2022, 9:51 AM

## 2022-07-30 NOTE — Progress Notes (Signed)
Pt signed 72 hour request for discharge at 1630 per her request. Pt states " I have things to do at the end of the week so I just need to make sure I'm out by Thursday." Pt encouraged to discuss discharge plan with physician, pt states she will however still opts to sign document this afternoon.

## 2022-07-30 NOTE — Group Note (Signed)
LCSW Group Therapy Note  Group Date: 07/30/2022 Start Time: 1300 End Time: 1400   Type of Therapy and Topic:  Group Therapy - How To Cope with Nervousness about Discharge   Participation Level:  Active   Description of Group This process group involved identification of patients' feelings about discharge. Some of them are scheduled to be discharged soon, while others are new admissions, but each of them was asked to share thoughts and feelings surrounding discharge from the hospital. One common theme was that they are excited at the prospect of going home, while another was that many of them are apprehensive about sharing why they were hospitalized. Patients were given the opportunity to discuss these feelings with their peers in preparation for discharge.  Therapeutic Goals  Patient will identify their overall feelings about pending discharge. Patient will think about how they might proactively address issues that they believe will once again arise once they get home (i.e. with parents). Patients will participate in discussion about having hope for change.   Summary of Patient Progress:  [name] was very active throughout the session. She demonstrated good insight into the subject matter, and proved open to input from peers and feedback from CSW. She was respectful of peers and participated throughout the entire session.   Therapeutic Modalities Cognitive Behavioral Therapy   Beatris Si, LCSW 07/30/2022  1:55 PM

## 2022-07-30 NOTE — Progress Notes (Signed)
   07/30/22 0547  15 Minute Checks  Location Dayroom  Visual Appearance Calm  Behavior Composed  Sleep (Behavioral Health Patients Only)  Calculate sleep? (Click Yes once per 24 hr at 0600 safety check) Yes  Documented sleep last 24 hours 7.5

## 2022-07-30 NOTE — Plan of Care (Signed)
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
## 2022-07-30 NOTE — [facility] Suicide Risk Assessment (Signed)
[facility] Admission Suicide Risk Assessment  Nursing information obtained from: Patient Demographic factors: NA Current Mental Status: Suicidal ideation indicated by patient, Self-harm behaviors, Suicide plan, Plan includes specific time, place, or method, Self-harm thoughts, Intention to act on plan to harm others Loss Factors: Loss of significant relationship (Loss of husband in past to suicide/Daughter no longer in her custody) Historical Factors: Prior suicide attempts, Family history of mental illness or substance abuse, Impulsivity Risk Reduction Factors: Living with another person, especially a relative, Sense of responsibility to family  Principal Problem: Schizoaffective disorder, depressive type (HCC) Diagnosis: Principal Problem:   Schizoaffective disorder, depressive type (HCC) Active Problems:   Borderline personality disorder (HCC)   Asperger syndrome   PTSD (post-traumatic stress disorder)   GAD (generalized anxiety disorder)   Self-injurious behavior   Fibromyalgia   Subjective Data:  [name] is a 32 y.o. 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 (07/29/2022) for suicidal ideation  Continued Clinical Symptoms:  Alcohol Use Disorder Identification Test Final Score (AUDIT): 0 The "Alcohol Use Disorders Identification Test", Guidelines for Use in Primary Care, Second Edition.  World Science writer [facility]). Score between 0-7:  no or low risk or alcohol related problems. Score between 8-15:  moderate risk of alcohol related problems. Score between 16-19:  high risk of alcohol related problems. Score 20 or above:  warrants further diagnostic evaluation for alcohol dependence and  treatment.  CLINICAL FACTORS:  Previous Psychiatric Diagnoses and Treatments  Musculoskeletal: Strength & Muscle Tone: within normal limits Gait & Station: normal Patient leans: N/A   Presentation  General Appearance:Appropriate for Environment, Casual, Fairly Groomed Eye Contact:Good Speech:Clear and Coherent, Normal Rate Volume:Normal Handedness:Right  Mood and Affect  Mood:Anxious Affect:Congruent, Appropriate, Restricted  Thought Process  Thought Process:Coherent, Goal Directed, Linear Descriptions of Associations:Intact  Thought Content Suicidal Thoughts:Suicidal Thoughts: No Homicidal Thoughts:Homicidal Thoughts: No Hallucinations:Hallucinations: Auditory Ideas of Reference:None Thought Content:Rumination, Perseveration  Sensorium  Memory:Immediate Fair, Recent Fair Judgment:Fair Insight:Shallow  Executive Functions  Orientation:Full (Time, Place and Person) Language:Good Concentration:Fair Attention:Fair Recall:Good Fund of Knowledge:Good  Psychomotor Activity  Psychomotor Activity:Psychomotor Activity: Increased  Assets  Assets:Communication Skills, Desire for Improvement  Sleep  Quality:Fair   COGNITIVE FEATURES THAT CONTRIBUTE TO RISK:  Closed-mindedness, Loss of executive function, Polarized thinking, and Thought constriction (tunnel vision)    SUICIDE RISK:  Severe: Frequent, intense, and enduring suicidal ideation, specific plan, no subjective intent, but some objective markers of intent (i.e., choice of lethal method), the method is accessible, some limited preparatory behavior, evidence of impaired self-control, severe dysphoria/symptomatology, multiple risk factors present, and few if any protective factors, particularly a lack of social support.  PLAN OF CARE:  Patient met requirement for inpatient psychiatric hospitalization and has been admitted.  See H&P & attending's attestation for detailed plan.  I certify that inpatient services  furnished can reasonably be expected to improve the patient's condition.  Total Time spent with patient:  See attending attestation  Signed: Princess Bruins, DO Psychiatry Resident, PGY-2 [facility]  [address] [place], [place] [address] Ph: [phone] Fax: [phone] 07/30/2022, 2:07 PM
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
## 2022-07-30 NOTE — Progress Notes (Signed)
   07/30/22 2000  Psych Admission Type (Psych Patients Only)  Admission Status Voluntary  Psychosocial Assessment  Patient Complaints Anxiety  Eye Contact Fair  Facial Expression Anxious  Affect Anxious;Depressed  Speech Logical/coherent  Interaction Assertive  Motor Activity Slow  Appearance/Hygiene Unremarkable  Behavior Characteristics Cooperative  Mood Depressed  Aggressive Behavior  Effect No apparent injury  Thought Process  Coherency WDL  Content WDL  Delusions WDL  Perception WDL  Hallucination None reported or observed  Judgment Limited  Confusion None  Danger to Self  Current suicidal ideation? Passive  Self-Injurious Behavior Some self-injurious ideation observed or expressed.  No lethal plan expressed   Agreement Not to Harm Self Yes  Description of Agreement verbal contract for safety  Danger to Others  Danger to Others None reported or observed  Danger to Others Abnormal  Harmful Behavior to others No threats or harm toward other people  Destructive Behavior No threats or harm toward property

## 2022-07-30 NOTE — Group Note (Signed)
Recreation Therapy Group Note   Group Topic:Team Building  Group Date: 07/30/2022 Start Time: 1015 End Time: 1040 Facilitators: Yeilyn Gent-McCall, LRT,CTRS Location: [address] Day[address]   Goal Area(s) Addresses:  Patient will effectively work with peer towards shared goal.  Patient will identify skill used to make activity successful.  Patient will identify how skills used during activity can be used to reach post d/c goals.    Group Description: Patient(s) were given a set of solo cups, a rubber band, and some tied strings. The objective is to build a pyramid with the cups by only using the rubber band and string to move the cups. After the activity the patient(s) are LRT debriefed and discussed what strategies worked, what didn't, and what lessons they can take from the activity and use in life post discharge.    Affect/Mood: N/A   Participation Level: Did not attend    Clinical Observations/Individualized Feedback:     Plan: Continue to engage patient in RT group sessions 2-3x/week.   Asma Boldon-McCall, LRT,CTRS 07/30/2022 1:30 PM

## 2022-07-31 DIAGNOSIS — F251 Schizoaffective disorder, depressive type: Secondary | ICD-10-CM | POA: Diagnosis not present

## 2022-07-31 LAB — IRON AND TIBC
Iron: 50 ug/dL (ref 28–170)
Saturation Ratios: 12 % (ref 10.4–31.8)
TIBC: 409 ug/dL (ref 250–450)
UIBC: 359 ug/dL

## 2022-07-31 LAB — FERRITIN: Ferritin: 7 ng/mL — ABNORMAL LOW (ref 11–307)

## 2022-07-31 MED ORDER — LORAZEPAM 1 MG PO TABS
1.0000 mg | ORAL_TABLET | Freq: Once | ORAL | Status: AC
  Administered 2022-07-31: 1 mg via ORAL
  Filled 2022-07-31: qty 1

## 2022-07-31 NOTE — Group Note (Signed)
Date:  07/31/2022 Time:  12:17 PM  Group Topic/Focus:  Coping With Mental Health Crisis:   The purpose of this group is to help patients identify strategies for coping with mental health crisis.  Group discusses possible causes of crisis and ways to manage them effectively. Early Warning Signs:   The focus of this group is to help patients identify signs or symptoms they exhibit before slipping into an unhealthy state or crisis.    Participation Level:  Did Not Attend  Participation Quality:    Affect:    Cognitive:    Insight:   Engagement in Group:    Modes of Intervention:    Additional Comments:    Memory Dance Keisy Strickler 07/31/2022, 12:17 PM

## 2022-07-31 NOTE — Progress Notes (Signed)
Pt complained of chest pain 5/10 around 2030, V/S was obtained and was WNL, EKG was obtained and the provider on call was notified. Ativan 1 mg PO one time was ordered and administered together with her night time medications. Pt handled it well and went to bed. Pt checked 30 min later and was a sleep with no signs of distress, will continue to monitor.

## 2022-07-31 NOTE — Group Note (Signed)
Recreation Therapy Group Note   Group Topic:Animal Assisted Therapy   Group Date: 07/31/2022 Start Time: 0945 End Time: 1031 Facilitators: Madeline Bebout-McCall, LRT,CTRS Location: [address] Dayroom   Animal-Assisted Activity (AAA) Program Checklist/Progress Notes Patient Eligibility Criteria Checklist & Daily Group note for Rec Tx Intervention  AAA/T Program Assumption of Risk Form signed by Patient/ or Parent Legal Guardian Yes  Patient understands his/her participation is voluntary Yes   Affect/Mood: N/A   Participation Level: Did not attend    Clinical Observations/Individualized Feedback:     Plan: Continue to engage patient in RT group sessions 2-3x/week.   [name], LRT,CTRS 07/31/2022 1:20 PM

## 2022-07-31 NOTE — Progress Notes (Signed)
[facility] MD Progress Note  Date: 07/31/2022 2:29 PM Name: [name]  DOB: 1990/12/22 MRN: [id number] Unit: [id number]  CC: suicidal ideation  REASON FOR ADMISSION   [name] is a 32 y.o. female with PMH of  schizoaffective-bipolar type, GAD, PTSD, carrier of fragile x syndrome, ASD v asperger syndrome, borderline personality, NSSIB (cutting), fibromyalgia, multiple suicide attempt, multiple inpt psych admission, who presented voluntary to [facility] (07/29/2022) via GPD, then transferred Voluntary to [facility] (07/29/2022) for suicidal ideation   PTA/Home Rx: cyclobenzaprine 10 mg daily, lexapro 20 mg daily, gabapentin 600 TID, zyprexa 10 mg at bedtime, pantoprazole 40 mg daily, prazosin 1 mg at bedtime, trazodone 50 mg at bedtime,  Psych med management:  CST services from [place].  Psychotherapy:  CST services from [place].  PCP: [facility], [facility]  Principal Problem: Schizoaffective disorder, depressive type (HCC) Diagnosis: Principal Problem:   Schizoaffective disorder, depressive type (HCC) Active Problems:   Borderline personality disorder (HCC)   Asperger syndrome   PTSD (post-traumatic stress disorder)   GAD (generalized anxiety disorder)   Self-injurious behavior   Fibromyalgia  CHART REVIEW FROM LAST 24 HOURS   Adherent to scheduled meds: yes  Agitation PRNs: NA Per nursing staff: no acute behavioral concerns Groups: attends Documented sleep last 24 hours: 7.75   SUBJECTIVE  Patient was initially seen ambulating on the unit, no acute distress. Patient was distant, calm during evaluation.  Denied side effects to medications  Mood:Anxious, wants to make sure she is going home for a dental appointment and going to the movies this weekend.  Sleep:Fair Appetite: Fair  SI:No HI:No WUJ:[id number], tells her to kill herself, which is baseline for patient. Stated that the voices are mild and she is able to ignore it.  Review of Systems  Respiratory:   Negative for shortness of breath.   Cardiovascular:  Negative for chest pain.  Gastrointestinal:  Negative for nausea and vomiting.  Neurological:  Negative for dizziness and headaches.   HISTORY  Past Psychiatric History:  Previous Psych Diagnoses: Schizoaffective disorder bipolar type, Anxiety, Autism spectrum disorder, PTSD, Borderline personality disorder Prior inpatient treatment: multiple Current/prior outpatient treatment: [facility] Prior rehab hx: denies multiple Psychotherapy hx: [facility] History of suicide: 20-30 times by overdosing on pills, cutting or drinking 409 disinfectant History of homicide or aggression: denies Psychiatric medication history:  Previously trialed Tegretol, Haldol, Clozaril, Depakote, Invega, Lamictal, Seroquel, asenapine, abilify, vraylar, Lexapro, Zoloft  Neuromodulation history: denies   Past Medical/Surgical History:  Medical Diagnoses: Asthma, Seizure once in 2020, Fibromyalgia, Asperger's Syndrome, Acid Reflux  Home Rx: Protonix, Prazosin, Metoprolol, Vraylar, Lyrica, Lexapro  Prior Hosp: Patient has had 6 inpatient visits in the last 5 months Prior Surgeries/Trauma: No surgeries  Head trauma, LOC, concussions, seizures: seizure in 2020 PCP: Center, [facility] - Dr. Wynelle Link Allergies: Bee venom, Coconut flavor, Fish allergy, Geodon [ziprasidone hcl], Haloperidol and related, Lithobid [lithium], Roxicodone [oxycodone], Seroquel [quetiapine], Shellfish allergy, Phenergan [promethazine hcl], Prilosec [omeprazole], Sulfa antibiotics, Tegretol [carbamazepine], Prozac [fluoxetine], Tape, and Tylenol [acetaminophen]    Family Psychiatric History:  Medical: denies  Psych: Father has PTSD, Mother and Brother has Autism, Brother has depression  Psych Rx: SA/HA: denies  Substance use family hx: denies    Social History:  Childhood (bring, raised, lives now, parents, siblings, schooling, education): Patient was born and raised in [place], [place], patient has  2 brothers, patient has 3 associate degrees.  Abuse: Sexual, Physical, Emotional, Verbal  Marital Status: widow Sexual orientation: heterosexual  Children:1 daughter  Employment: unemployed Peer Group: CST Housing: Patient rents an apartment with a friend Finances: Patient receives disability  Legal: denies Hotel manager: denies   Substance Use History: Alcohol: denies Tobacco: denies Illicit drugs: denies Rx drug abuse: denies Rehab hx: denies   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    Chronic constipation    Depression    Drug-seeking behavior    Essential tremor    Headache    Ineffective individual coping 05/16/2022   Insomnia 01/12/2022   Intentional drug overdose (HCC) 06/05/2022   Neuromuscular disorder (HCC)    Normocytic anemia 06/05/2022   Overdose 07/22/2017   Overdose of acetaminophen 07/2017   and other meds   Overdose, intentional self-harm, initial encounter (HCC) 07/20/2021   Paranoia (HCC) 04/22/2021   Personality disorder (HCC)    Purposeful non-suicidal drug ingestion (HCC) 06/27/2021   Schizo-affective psychosis (HCC)    Schizoaffective disorder (HCC) 07/29/2022   Schizoaffective disorder, bipolar type (HCC)    Seizures (HCC)    Last seizure December 2017   Skin erythema 04/27/2022   Sleep apnea    Suicidal behavior 07/25/2021   Suicidal ideation    Suicide (HCC) 07/01/2021   Suicide attempt (HCC) 07/04/2021    Past Surgical History:  Procedure Laterality Date   MOUTH SURGERY  2009 or 2010   Family History:  Family History  Problem Relation Age of Onset   Mental illness Father    Asthma Father    PDD  Brother    Seizures Brother    Social History:  Social History   Substance and Sexual Activity  Alcohol Use No   Alcohol/week: 1.0 standard drink of alcohol   Types: 1 Standard drinks or equivalent per week   Comment: denies at this time     Social History   Substance and Sexual Activity  Drug Use No   Comment: History of cocaine use at age 65 for 4 months    Social History   Socioeconomic History   Marital status: Widowed    Spouse name: Not on file   Number of children: 0   Years of education: Not on file   Highest education level: Not on file  Occupational History   Occupation: disability  Tobacco Use   Smoking status: Former    Packs/day: 0    Types: Cigarettes   Smokeless tobacco: Never   Tobacco comments:    Smoked for 2  years age [age]  Vaping Use   Vaping Use: Never used  Substance and Sexual Activity   Alcohol use: No    Alcohol/week: 1.0 standard drink of alcohol    Types: 1 Standard drinks or equivalent per week    Comment: denies at this time   Drug use: No    Comment: History of cocaine use at age 26 for 4 months   Sexual activity: Not Currently    Birth control/protection: None  Other Topics Concern   Not on file  Social History Narrative   Marital status: Widowed      Children: daughter      Lives: with boyfriend, in two story home  Employment:  Disability      Tobacco: quit smoking; smoked for two years.      Alcohol ;none      Drugs: none   Has not traveled outside of the country.   Right handed         Patient with hx of Fibromyalgia,Orthostatic Tachycardia,Asthma,Arthritis,Gerd, ASD   Social Determinants of Health   Financial Resource Strain: Not on file  Food Insecurity: No Food Insecurity (07/29/2022)   Hunger Vital Sign    Worried About Running Out of Food in the Last Year: Never true    Ran Out of Food in the Last Year: Never true  Transportation Needs: No Transportation Needs (07/29/2022)   PRAPARE - Therapist, art (Medical): No    Lack of Transportation (Non-Medical): No  Recent Concern: Transportation Needs - Unmet Transportation Needs (05/06/2022)   PRAPARE - Administrator, Civil Service (Medical): Yes    Lack of Transportation (Non-Medical): Yes  Physical Activity: Not on file  Stress: Not on file  Social Connections: Not on file   Additional Social History:                         Current Medications: Current Facility-Administered Medications  Medication Dose Route Frequency Provider Last Rate Last Admin   albuterol (VENTOLIN HFA) 108 (90 Base) MCG/ACT inhaler 2 puff  2 puff Inhalation Q6H PRN Ardis Hughs, NP       alum & mag hydroxide-simeth (MAALOX/MYLANTA) 200-200-20 MG/5ML suspension 30 mL  30 mL Oral Q4H PRN Ardis Hughs, NP       benzocaine (ORAJEL) 10 % mucosal gel   Mouth/Throat BID PRN Princess Bruins, DO       cyclobenzaprine (FLEXERIL) tablet 10 mg  10 mg Oral QHS Ardis Hughs, NP   10 mg at 07/30/22 2033   escitalopram (LEXAPRO) tablet 20 mg  20 mg Oral Daily Ardis Hughs, NP   20 mg at 07/31/22 0753   gabapentin (NEURONTIN) capsule 600 mg  600 mg Oral TID Ardis Hughs, NP   600 mg at 07/31/22 1149   hydrOXYzine (ATARAX) tablet 25 mg  25 mg Oral TID PRN Ardis Hughs, NP   25 mg at 07/30/22 2033   OLANZapine zydis (ZYPREXA) disintegrating tablet 5 mg  5 mg Oral Q8H PRN Ardis Hughs, NP       And   LORazepam (ATIVAN) tablet 1 mg  1 mg Oral PRN Ardis Hughs, NP       magnesium hydroxide (MILK OF MAGNESIA) suspension 30 mL  30 mL Oral Daily PRN Ardis Hughs, NP       melatonin tablet 3 mg  3 mg Oral QHS Ardis Hughs, NP       mometasone-formoterol (DULERA) 200-5 MCG/ACT inhaler 2 puff  2 puff Inhalation BID Princess Bruins, DO   2 puff at 07/31/22 0753   OLANZapine zydis (ZYPREXA) disintegrating tablet 10 mg  10 mg Oral QHS Ardis Hughs, NP   10 mg at 07/30/22 2034   pantoprazole  (PROTONIX) EC tablet 40 mg  40 mg Oral Daily Ardis Hughs, NP   40 mg at 07/31/22 0753   polyethylene glycol (MIRALAX / GLYCOLAX) packet 17 g  17 g Oral Daily Princess Bruins, DO   17 g at 07/30/22 1603   prazosin (MINIPRESS) capsule 1 mg  1 mg Oral QHS Ardis Hughs,  NP   1 mg at 07/30/22 2033   traZODone (DESYREL) tablet 50 mg  50 mg Oral QHS PRN Ardis Hughs, NP       OBJECTIVE  BP (!) 112/92 (BP Location: Right Arm)   Pulse 68   Temp 98.4 F (36.9 C) (Oral)   Resp 20   Ht 5\' 4"  (1.626 m)   Wt 100 kg   LMP 07/22/2022 (Approximate)   SpO2 100%   BMI 37.83 kg/m   Physical Exam Physical Exam Vitals and nursing note reviewed.  Constitutional:      General: She is awake. She is not in acute distress.    Appearance: She is not ill-appearing, toxic-appearing or diaphoretic.  HENT:     Head: Normocephalic.  Pulmonary:     Effort: Pulmonary effort is normal. No respiratory distress.  Neurological:     Mental Status: She is alert.     AIMS:   ,  ,  ,  ,      Psychiatric Specialty Exam: Presentation General Appearance:Appropriate for Environment, Casual, Fairly Groomed Eye Contact:Good Speech:Clear and Coherent, Normal Rate Volume:Normal Handedness:Right  Mood and Affect  Mood:Anxious Affect:Congruent, Appropriate, Restricted  Thought Process  Thought Process:Coherent, Goal Directed, Linear Descriptions of Associations:Intact  Thought Content Suicidal Thoughts:No Homicidal Thoughts:No Hallucinations:Auditory Ideas of Reference:None Thought Content:Rumination, Perseveration  Sensorium  Memory:Immediate Fair, Recent Fair Judgment:Fair Insight:Shallow  Executive Functions  Orientation:Full (Time, Place and Person) Language:Good Concentration:Fair Attention:Fair Recall:Good Fund of Knowledge:Good  Psychomotor Activity  Psychomotor Activity:Psychomotor Activity: Increased  Assets  Assets:Communication Skills, Desire for Improvement  Sleep   Quality:Fair  Lab Results:  Results for orders placed or performed during the hospital encounter of 07/29/22 (from the past 48 hour(s))  Iron and TIBC     Status: None   Collection Time: 07/31/22  6:39 AM  Result Value Ref Range   Iron 50 28 - 170 ug/dL   TIBC 409 811 - 914 ug/dL   Saturation Ratios 12 10.4 - 31.8 %   UIBC 359 ug/dL    Comment: Performed at [facility], [address], [place], [place] [address]  Ferritin     Status: Abnormal   Collection Time: 07/31/22  6:39 AM  Result Value Ref Range   Ferritin 7 (L) 11 - 307 ng/mL    Comment: Performed at [facility], [address], [place], [place] [address]    Blood Alcohol level:  Lab Results  Component Value Date   ETH <10 07/29/2022   ETH <10 07/13/2022    Metabolic Disorder Labs: Lab Results  Component Value Date   HGBA1C 5.2 04/20/2022   MPG 103 04/20/2022   MPG 96.8 10/07/2021   Lab Results  Component Value Date   PROLACTIN 66.2 (H) 03/02/2021   PROLACTIN 66.1 (H) 02/04/2020   Lab Results  Component Value Date   CHOL 191 04/20/2022   TRIG 163 (H) 04/20/2022   HDL 52 04/20/2022   CHOLHDL 3.7 04/20/2022   VLDL 33 04/20/2022   LDLCALC 106 (H) 04/20/2022   LDLCALC 102 (H) 12/10/2021   ASSESSMENT / PLAN  Diagnoses / Active Problems: Principal Problem:   Schizoaffective disorder, depressive type (HCC) Active Problems:   Borderline personality disorder (HCC)   Asperger syndrome   PTSD (post-traumatic stress disorder)   GAD (generalized anxiety disorder)   Self-injurious behavior   Fibromyalgia  Safety and Monitoring: Voluntary admission to inpatient psychiatric unit for safety, stabilization and treatment    2. Psychiatric Diagnoses and  Treatment:  Schizoaffective d/o-bipolar type, currently depressed  GAD  Borderline personality d/o  Still having mood congruent AH, that she is able to ignore. Otherwise no SI and mood is not labile. Continued home  lexapro 20 mg daily Continued home zyprexa 10 mg qHS   PTSD Continued home SSRI per above   3. Medical Issues Being Addressed:    Iron deficiency anemia See below. STARTED MV+Fe  Fibromyalgia  Continued home gabapentin 600 mg TID Continued home flexeril 10 mg qHS   GERD Continued home protoniz EC 40 mg daily   4. Routine and other pertinent labs:   BMI:37.81  QTc: 479 CMP WNL CBC showed WBC 10.8, Hb 11, MCV 79.4, otherwise WNL Tylenol level <10  Salicylate level <7  UDS + negative BAL <10 A1c 5.2 (04/20/2022)  Lipid Panel: Trig 163, LDL 106, otherwise WNL (04/20/2022) TSH 1.353 (05/13/2022)  Upreg negative  Treatment Plan Summary: I certify that inpatient services furnished can reasonably be expected to improve the patient's condition.   Daily contact with patient to assess and evaluate symptoms and progress in treatment and Medication management Daily contact with patient to assess and evaluate symptoms and progress in treatment Patient's case to be discussed in multi-disciplinary team meeting Observation Level: q15 minute checks  Vital signs: q12 hours Precautions: suicide, elopement, and assault The risks/benefits/side-effects/alternatives to this medication were discussed in detail with the patient and time was given for questions. The patient consents to medication trial. The patient consents to medication trial. FDA black box warnings, if present, were discussed. Metabolic profile and EKG monitoring obtained while on an atypical antipsychotic  Encouraged patient to participate in unit milieu and in scheduled group therapies  Short Term Goals: Ability to identify changes in lifestyle to reduce recurrence of condition will improve, Ability to verbalize feelings will improve, Ability to disclose and discuss suicidal ideas, Ability to demonstrate self-control will improve, Ability to identify and develop effective coping behaviors will improve, Ability to maintain clinical  measurements within normal limits will improve, Compliance with prescribed medications will improve, and Ability to identify triggers associated with substance abuse/mental health issues will improve Long Term Goals: Improvement in symptoms so as ready for discharge Social work and case management to assist with discharge planning and identification of hospital follow-up needs prior to discharge Estimated LOS: 5-7 days Discharge Concerns: Need to establish a safety plan; Medication compliance and effectiveness Discharge Goals: Return home with outpatient referrals for mental health follow-up including medication management/psychotherapy  Total Time spent with patient:  Patient's case was discussed with Attending, see attestation for more information.   Signed: Princess Bruins, DO Psychiatry Resident, PGY-2 [facility]  [address] [place], [place] [address] Ph: [phone] Fax: [phone]

## 2022-08-01 DIAGNOSIS — F251 Schizoaffective disorder, depressive type: Secondary | ICD-10-CM | POA: Diagnosis not present

## 2022-08-01 MED ORDER — ONDANSETRON 4 MG PO TBDP
4.0000 mg | ORAL_TABLET | Freq: Three times a day (TID) | ORAL | Status: DC | PRN
  Administered 2022-08-01: 4 mg via ORAL
  Filled 2022-08-01: qty 1

## 2022-08-01 MED ORDER — IBUPROFEN 400 MG PO TABS
400.0000 mg | ORAL_TABLET | Freq: Four times a day (QID) | ORAL | Status: DC | PRN
  Administered 2022-08-01: 400 mg via ORAL
  Filled 2022-08-01: qty 1

## 2022-08-01 NOTE — [facility] Suicide Risk Assessment (Signed)
[facility] INPATIENT:  Family/Significant Other Suicide Prevention Education  Suicide Prevention Education:  Contact Attempts: Lupita Leash, CST member- [phone] (name of family member/significant other) has been identified by the patient as the family member/significant other with whom the patient will be residing, and identified as the person(s) who will aid the patient in the event of a mental health crisis.  With written consent from the patient, two attempts were made to provide suicide prevention education, prior to and/or following the patient's discharge.  We were unsuccessful in providing suicide prevention education.  A suicide education pamphlet was given to the patient to share with family/significant other.  Date and time of first attempt:6/25/2pm Date and time of second attempt:6/26/ 3pm  Matheu Ploeger E Alley Neils 08/01/2022, 3:08 PM

## 2022-08-01 NOTE — [facility] Group Notes (Signed)
[facility] Group Notes:  (Nursing/MHT/Case Management/Adjunct)  Date:  08/01/2022  Time:  2000  Type of Therapy:   Narcotics Anonymous Meeting  Participation Level:  Active  Participation Quality:  Appropriate, Attentive, Sharing, and Supportive  Affect:  Anxious  Cognitive:  Alert  Insight:  Lacking  Engagement in Group:  Engaged  Modes of Intervention:  Education and Support  Summary of Progress/Problems:  Marcille Buffy 08/01/2022, 9:54 PM

## 2022-08-01 NOTE — Group Note (Signed)
Recreation Therapy Group Note   Group Topic:Stress Management  Group Date: 08/01/2022 Start Time: 0935 End Time: 1000 Facilitators: Collins Kerby-McCall, LRT,CTRS Location: 300 [address] Dayroom   Goal Area(s) Addresses:  Patient will actively participate in stress management techniques presented during session.  Patient will successfully identify benefit of practicing stress management post d/c.   Group Description: Guided Imagery. LRT provided education, instruction, and demonstration on practice of visualization via guided imagery. Patient was asked to participate in the technique introduced during session. LRT debriefed including topics of mindfulness, stress management and specific scenarios each patient could use these techniques. Patients were given suggestions of ways to access scripts post d/c and encouraged to explore Youtube and other apps available on smartphones, tablets, and computers.   Affect/Mood: N/A   Participation Level: Did not attend    Clinical Observations/Individualized Feedback:      Plan: Continue to engage patient in RT group sessions 2-3x/week.   Lev Cervone-McCall, LRT,CTRS 08/01/2022 11:49 AM

## 2022-08-01 NOTE — Progress Notes (Signed)
Adult Psychoeducational Group Note  Date:  08/01/2022 Time:  12:57 AM  Group Topic/Focus:  Wrap-Up Group:   The focus of this group is to help patients review their daily goal of treatment and discuss progress on daily workbooks.  Participation Level:  Did Not Attend  Participation Quality:  Appropriate  Affect:  Appropriate  Cognitive:  Appropriate  Insight: Appropriate  Engagement in Group:  None  Modes of Intervention:  Discussion  Additional Comments:  Pt. Did not attend group.  Joselyn Arrow 08/01/2022, 12:57 AM

## 2022-08-01 NOTE — [facility] Counselor (Signed)
[facility]/BM[facility] LCSW Progress Note   08/01/2022    1:43 PM  [name]   [id number]   Type of Contact and Topic:  Medicare Notice  Patient informed of right to appeal discharge, provided phone number to [facility]. Patient expressed no interest in appealing discharge at this time. CSW will continue to monitor situation.     Signed:  Anson [place] MSW, LCSW, LCAS 08/01/2022 1:43 PM

## 2022-08-01 NOTE — Progress Notes (Signed)
[facility] MD Progress Note  Date: 08/01/2022 9:04 AM Name: [name]  DOB: 23-Jan-1991 MRN: [id number] Unit: [id number]  CC: suicidal ideation  REASON FOR ADMISSION   [name] is a 32 y.o. female with PMH of  schizoaffective-bipolar type, GAD, PTSD, carrier of fragile x syndrome, ASD v asperger syndrome, borderline personality, NSSIB (cutting), fibromyalgia, multiple suicide attempt, multiple inpt psych admission, who presented voluntary to [facility] (07/29/2022) via [facility], then transferred Voluntary to [facility] (07/29/2022) for suicidal ideation   PTA/Home Rx: cyclobenzaprine 10 mg daily, lexapro 20 mg daily, gabapentin 600 TID, zyprexa 10 mg at bedtime, pantoprazole 40 mg daily, prazosin 1 mg at bedtime, trazodone 50 mg at bedtime,  Psych med management:  CST services from [place].  Psychotherapy:  C[facility] services from [place].  PCP: [place], [facility]  Principal Problem: Schizoaffective disorder, depressive type (HCC) Diagnosis: Principal Problem:   Schizoaffective disorder, depressive type (HCC) Active Problems:   Borderline personality disorder (HCC)   Asperger syndrome   PTSD (post-traumatic stress disorder)   GAD (generalized anxiety disorder)   Self-injurious behavior   Fibromyalgia  CHART REVIEW FROM LAST 24 HOURS   Adherent to scheduled meds: yes  Agitation PRNs: NA Per nursing staff: no acute behavioral concerns Groups: attends most Documented sleep last 24 hours: 9   SUBJECTIVE  Patient was initially seen ambulating on the unit, no acute distress. Patient was distant with poor eye contact, calm, and wanting to go home, during evaluation.  Mood:Euthymic, "good".  Sleep:Good Appetite: Fair  SI:No (last time was 6/24, passive) HI:No WUJ:WJXB (last time 6/25 AM, saying to throw trashcan which she did not)  Denied medication side effects. Stated that she wants to go home today if possible. Otherwise has no other questions or concerns.   Review of  Systems  Respiratory:  Negative for shortness of breath.   Cardiovascular:  Negative for chest pain.  Gastrointestinal:  Negative for nausea and vomiting.  Neurological:  Negative for dizziness and headaches.   HISTORY  Past Psychiatric History:  Previous Psych Diagnoses: Schizoaffective disorder bipolar type, Anxiety, Autism spectrum disorder, PTSD, Borderline personality disorder Prior inpatient treatment: multiple Current/prior outpatient treatment: [facility] Prior rehab hx: denies multiple Psychotherapy hx: [facility] History of suicide: 20-30 times by overdosing on pills, cutting or drinking 409 disinfectant History of homicide or aggression: denies Psychiatric medication history:  Previously trialed Tegretol, Haldol, Clozaril, Depakote, Invega, Lamictal, Seroquel, asenapine, abilify, vraylar, Lexapro, Zoloft  Neuromodulation history: denies   Past Medical/Surgical History:  Medical Diagnoses: Asthma, Seizure once in 2020, Fibromyalgia, Asperger's Syndrome, Acid Reflux  Home Rx: Protonix, Prazosin, Metoprolol, Vraylar, Lyrica, Lexapro  Prior Hosp: Patient has had 6 inpatient visits in the last 5 months Prior Surgeries/Trauma: No surgeries  Head trauma, LOC, concussions, seizures: seizure in 2020 PCP: [place], [facility] - Dr. Wynelle Link Allergies: Bee venom, Coconut flavor, Fish allergy, Geodon [ziprasidone hcl], Haloperidol and related, Lithobid [lithium], Roxicodone [oxycodone], Seroquel [quetiapine], Shellfish allergy, Phenergan [promethazine hcl], Prilosec [omeprazole], Sulfa antibiotics, Tegretol [carbamazepine], Prozac [fluoxetine], Tape, and Tylenol [acetaminophen]    Family Psychiatric History:  Medical: denies  Psych: Father has PTSD, Mother and Brother has Autism, Brother has depression  Psych Rx: SA/HA: denies  Substance use family hx: denies    Social History:  Childhood (bring, raised, lives now, parents, siblings, schooling, education): Patient was born and raised in  [place], [place], patient has 2 brothers, patient has 3 associate degrees.  Abuse: Sexual, Physical, Emotional, Verbal  Marital Status: widow Sexual orientation: heterosexual  Children:1 daughter  Employment: unemployed Peer Group: CST Housing: Patient rents an apartment with a friend Finances: Patient receives disability  Legal: denies Hotel manager: denies   Substance Use History: Alcohol: denies Tobacco: denies Illicit drugs: denies Rx drug abuse: denies Rehab hx: denies   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    Chronic constipation    Depression    Drug-seeking behavior    Essential tremor    Headache    Ineffective individual coping 05/16/2022   Insomnia 01/12/2022   Intentional drug overdose (HCC) 06/05/2022   Neuromuscular disorder (HCC)    Normocytic anemia 06/05/2022   Overdose 07/22/2017   Overdose of acetaminophen 07/2017   and other meds   Overdose, intentional self-harm, initial encounter (HCC) 07/20/2021   Paranoia (HCC) 04/22/2021   Personality disorder (HCC)    Purposeful non-suicidal drug ingestion (HCC) 06/27/2021   Schizo-affective psychosis (HCC)    Schizoaffective disorder (HCC) 07/29/2022   Schizoaffective disorder, bipolar type (HCC)    Seizures (HCC)    Last seizure December 2017   Skin erythema 04/27/2022   Sleep apnea    Suicidal behavior 07/25/2021   Suicidal ideation    Suicide (HCC) 07/01/2021   Suicide attempt (HCC) 07/04/2021    Past Surgical History:  Procedure Laterality Date   MOUTH SURGERY  2009 or 2010   Family History:  Family History  Problem Relation Age of Onset   Mental illness  Father    Asthma Father    PDD Brother    Seizures Brother    Social History:  Social History   Substance and Sexual Activity  Alcohol Use No   Alcohol/week: 1.0 standard drink of alcohol   Types: 1 Standard drinks or equivalent per week   Comment: denies at this time     Social History   Substance and Sexual Activity  Drug Use No   Comment: History of cocaine use at age 54 for 4 months    Social History   Socioeconomic History   Marital status: Widowed    Spouse name: Not on file   Number of children: 0   Years of education: Not on file   Highest education level: Not on file  Occupational History   Occupation: disability  Tobacco Use   Smoking status: Former    Packs/day: 0    Types: Cigarettes   Smokeless tobacco: Never   Tobacco comments:    Smoked for 2  years age [age]  Vaping Use   Vaping Use: Never used  Substance and Sexual Activity   Alcohol use: No    Alcohol/week: 1.0 standard drink of alcohol    Types: 1 Standard drinks or equivalent per week    Comment: denies at this time   Drug use: No    Comment: History of cocaine use at age 43 for 4 months   Sexual activity: Not Currently    Birth control/protection: None  Other Topics Concern   Not on file  Social History Narrative   Marital status: Widowed      Children: daughter      Lives: with boyfriend, in  two story home      Employment:  Disability      Tobacco: quit smoking; smoked for two years.      Alcohol ;none      Drugs: none   Has not traveled outside of the country.   Right handed         Patient with hx of Fibromyalgia,Orthostatic Tachycardia,Asthma,Arthritis,Gerd, ASD   Social Determinants of Health   Financial Resource Strain: Not on file  Food Insecurity: No Food Insecurity (07/29/2022)   Hunger Vital Sign    Worried About Running Out of Food in the Last Year: Never true    Ran Out of Food in the Last Year: Never true  Transportation Needs: No Transportation Needs (07/29/2022)    PRAPARE - Administrator, Civil Service (Medical): No    Lack of Transportation (Non-Medical): No  Recent Concern: Transportation Needs - Unmet Transportation Needs (05/06/2022)   PRAPARE - Administrator, Civil Service (Medical): Yes    Lack of Transportation (Non-Medical): Yes  Physical Activity: Not on file  Stress: Not on file  Social Connections: Not on file   Additional Social History:                         Current Medications: Current Facility-Administered Medications  Medication Dose Route Frequency Provider Last Rate Last Admin   albuterol (VENTOLIN HFA) 108 (90 Base) MCG/ACT inhaler 2 puff  2 puff Inhalation Q6H PRN Ardis Hughs, NP   2 puff at 07/31/22 1815   alum & mag hydroxide-simeth (MAALOX/MYLANTA) 200-200-20 MG/5ML suspension 30 mL  30 mL Oral Q4H PRN Ardis Hughs, NP       benzocaine (ORAJEL) 10 % mucosal gel   Mouth/Throat BID PRN Princess Bruins, DO   Given at 07/31/22 2104   cyclobenzaprine (FLEXERIL) tablet 10 mg  10 mg Oral QHS Ardis Hughs, NP   10 mg at 07/31/22 2104   escitalopram (LEXAPRO) tablet 20 mg  20 mg Oral Daily Ardis Hughs, NP   20 mg at 08/01/22 0743   gabapentin (NEURONTIN) capsule 600 mg  600 mg Oral TID Ardis Hughs, NP   600 mg at 08/01/22 1610   hydrOXYzine (ATARAX) tablet 25 mg  25 mg Oral TID PRN Ardis Hughs, NP   25 mg at 07/31/22 1447   OLANZapine zydis (ZYPREXA) disintegrating tablet 5 mg  5 mg Oral Q8H PRN Ardis Hughs, NP       And   LORazepam (ATIVAN) tablet 1 mg  1 mg Oral PRN Ardis Hughs, NP       magnesium hydroxide (MILK OF MAGNESIA) suspension 30 mL  30 mL Oral Daily PRN Ardis Hughs, NP   30 mL at 07/31/22 1954   melatonin tablet 3 mg  3 mg Oral QHS Ardis Hughs, NP       mometasone-formoterol (DULERA) 200-5 MCG/ACT inhaler 2 puff  2 puff Inhalation BID Princess Bruins, DO   2 puff at 08/01/22 0743   OLANZapine zydis (ZYPREXA)  disintegrating tablet 10 mg  10 mg Oral QHS Ardis Hughs, NP   10 mg at 07/31/22 2104   pantoprazole (PROTONIX) EC tablet 40 mg  40 mg Oral Daily Ardis Hughs, NP   40 mg at 08/01/22 0743   polyethylene glycol (MIRALAX / GLYCOLAX) packet 17 g  17 g Oral Daily Princess Bruins, DO   17 g at 08/01/22  2841   prazosin (MINIPRESS) capsule 1 mg  1 mg Oral QHS Ardis Hughs, NP   1 mg at 07/31/22 2104   traZODone (DESYREL) tablet 50 mg  50 mg Oral QHS PRN Ardis Hughs, NP       OBJECTIVE  BP 137/87 (BP Location: Right Arm)   Pulse (!) 114   Temp 97.8 F (36.6 C) (Oral)   Resp 20   Ht 5\' 4"  (1.626 m)   Wt 100 kg   LMP 07/22/2022 (Approximate)   SpO2 99%   BMI 37.83 kg/m   Physical Exam Physical Exam Vitals and nursing note reviewed.  Constitutional:      General: She is awake. She is not in acute distress.    Appearance: She is not ill-appearing, toxic-appearing or diaphoretic.  HENT:     Head: Normocephalic.  Pulmonary:     Effort: Pulmonary effort is normal. No respiratory distress.  Neurological:     Mental Status: She is alert.     AIMS:   ,  ,  ,  ,      Psychiatric Specialty Exam: Presentation General Appearance:Appropriate for Environment, Casual, Fairly Groomed Eye Contact:Good Speech:Clear and Coherent, Normal Rate Volume:Normal Handedness:Right  Mood and Affect  Mood:Euthymic Affect:Appropriate, Congruent, Constricted  Thought Process  Thought Process:Coherent, Goal Directed, Linear Descriptions of Associations:Intact  Thought Content Suicidal Thoughts:No (last time was 6/24, passive) Homicidal Thoughts:No Hallucinations:None (last time 6/25 AM, saying to throw trashcan which she did not) Ideas of Reference:None Thought Content:Perseveration  Sensorium  Memory:Immediate Good Judgment:Impaired Insight:Shallow  Executive Functions  Orientation:Full (Time, Place and  Person) Language:Good Concentration:Good Attention:Good Recall:Good Fund of Knowledge:Good  Psychomotor Activity  Psychomotor Activity:Psychomotor Activity: Increased  Assets  Assets:Communication Skills, Desire for Improvement, Leisure Time, Housing  Sleep  Quality:Good  Lab Results:  Results for orders placed or performed during the hospital encounter of 07/29/22 (from the past 48 hour(s))  Iron and TIBC     Status: None   Collection Time: 07/31/22  6:39 AM  Result Value Ref Range   Iron 50 28 - 170 ug/dL   TIBC 324 401 - 027 ug/dL   Saturation Ratios 12 10.4 - 31.8 %   UIBC 359 ug/dL    Comment: Performed at [facility], [address] [place], [place] [address]  Ferritin     Status: Abnormal   Collection Time: 07/31/22  6:39 AM  Result Value Ref Range   Ferritin 7 (L) 11 - 307 ng/mL    Comment: Performed at [facility], [address], [place], [place] [address]    Blood Alcohol level:  Lab Results  Component Value Date   ETH <10 07/29/2022   ETH <10 07/13/2022    Metabolic Disorder Labs: Lab Results  Component Value Date   HGBA1C 5.2 04/20/2022   MPG 103 04/20/2022   MPG 96.8 10/07/2021   Lab Results  Component Value Date   PROLACTIN 66.2 (H) 03/02/2021   PROLACTIN 66.1 (H) 02/04/2020   Lab Results  Component Value Date   CHOL 191 04/20/2022   TRIG 163 (H) 04/20/2022   HDL 52 04/20/2022   CHOLHDL 3.7 04/20/2022   VLDL 33 04/20/2022   LDLCALC 106 (H) 04/20/2022   LDLCALC 102 (H) 12/10/2021   ASSESSMENT / PLAN  Diagnoses / Active Problems: Principal Problem:   Schizoaffective disorder, depressive type (HCC) Active Problems:   Borderline personality disorder (HCC)   Asperger syndrome   PTSD (post-traumatic stress disorder)  GAD (generalized anxiety disorder)   Self-injurious behavior   Fibromyalgia  Safety and Monitoring: Voluntary admission to inpatient psychiatric unit for safety, stabilization  and treatment   72Hr 08/02/2022 @ 1630  2. Psychiatric Diagnoses and Treatment:  Schizoaffective d/o-bipolar type, currently depressed  GAD  Borderline personality d/o  No SI x48h and no AH x24h. No med changes.  Continued home lexapro 20 mg daily Continued home zyprexa 10 mg qHS   PTSD Continued home SSRI per above   3. Medical Issues Being Addressed:    Iron deficiency anemia See below. Continued MV+Fe  Fibromyalgia  Continued home gabapentin 600 mg TID Continued home flexeril 10 mg qHS   GERD Continued home protoniz EC 40 mg daily   4. Routine and other pertinent labs:   BMI:37.81  QTc: 479 CMP WNL CBC showed WBC 10.8, Hb 11, MCV 79.4, otherwise WNL Tylenol level <10  Salicylate level <7  UDS + negative BAL <10 A1c 5.2 (04/20/2022)  Lipid Panel: Trig 163, LDL 106, otherwise WNL (04/20/2022) TSH 1.353 (05/13/2022)  Upreg negative  Treatment Plan Summary: I certify that inpatient services furnished can reasonably be expected to improve the patient's condition.   Daily contact with patient to assess and evaluate symptoms and progress in treatment and Medication management Tentative DC date: 08/02/2022 Daily contact with patient to assess and evaluate symptoms and progress in treatment Patient's case to be discussed in multi-disciplinary team meeting Observation Level: q15 minute checks  Vital signs: q12 hours Precautions: suicide, elopement, and assault The risks/benefits/side-effects/alternatives to this medication were discussed in detail with the patient and time was given for questions. The patient consents to medication trial. The patient consents to medication trial. FDA black box warnings, if present, were discussed. Metabolic profile and EKG monitoring obtained while on an atypical antipsychotic  Encouraged patient to participate in unit milieu and in scheduled group therapies  Short Term Goals: Ability to identify changes in lifestyle to reduce recurrence of  condition will improve, Ability to verbalize feelings will improve, Ability to disclose and discuss suicidal ideas, Ability to demonstrate self-control will improve, Ability to identify and develop effective coping behaviors will improve, Ability to maintain clinical measurements within normal limits will improve, Compliance with prescribed medications will improve, and Ability to identify triggers associated with substance abuse/mental health issues will improve Long Term Goals: Improvement in symptoms so as ready for discharge Social work and case management to assist with discharge planning and identification of hospital follow-up needs prior to discharge Discharge Concerns: Need to establish a safety plan; Medication compliance and effectiveness Discharge Goals: Return home with outpatient referrals for mental health follow-up including medication management/psychotherapy  Total Time spent with patient:  Patient's case was discussed with Attending, see attestation for more information.   Signed: Princess Bruins, DO Psychiatry Resident, PGY-2 [facility] - Adult  [address] [place], [place] [address] Ph: [phone] Fax: [phone]

## 2022-08-01 NOTE — Care Management Important Message (Signed)
Medicare IM given to social work to give to the patient 

## 2022-08-01 NOTE — Progress Notes (Signed)
   08/01/22 1000  Psych Admission Type (Psych Patients Only)  Admission Status Voluntary  Psychosocial Assessment  Patient Complaints Anxiety  Eye Contact Fair  Facial Expression Anxious  Affect Anxious  Speech Logical/coherent  Interaction Assertive  Motor Activity Slow  Appearance/Hygiene Unremarkable  Behavior Characteristics Anxious  Mood Anxious  Thought Process  Coherency WDL  Content WDL  Delusions None reported or observed  Perception WDL  Hallucination None reported or observed  Judgment Poor  Confusion None  Danger to Self  Current suicidal ideation? Denies  Agreement Not to Harm Self Yes  Description of Agreement verbal  Danger to Others  Danger to Others None reported or observed  Danger to Others Abnormal  Harmful Behavior to others No threats or harm toward other people  Destructive Behavior No threats or harm toward property

## 2022-08-01 NOTE — Progress Notes (Signed)
Pt rates depression 0/10 and anxiety 0/10. Pt reports the good news for today is that she will discharge tomorrow. Pt reports a good appetite, and no physical problems. Pt denies SI/HI/AVH and verbally contracts for safety. Provided support and encouragement. Pt safe on the unit. Q 15 minute safety checks continued.

## 2022-08-02 DIAGNOSIS — F251 Schizoaffective disorder, depressive type: Secondary | ICD-10-CM

## 2022-08-02 MED ORDER — MELATONIN 3 MG PO TABS: 3.0000 mg | ORAL_TABLET | Freq: Every day | ORAL | 0 refills | Status: DC

## 2022-08-02 NOTE — H&P (Deleted)
[facility] Discharge Suicide Risk Assessment   Principal Problem: Schizoaffective disorder, depressive type (HCC) Discharge Diagnoses: Principal Problem:   Schizoaffective disorder, depressive type (HCC) Active Problems:   Borderline personality disorder (HCC)   Asperger syndrome   PTSD (post-traumatic stress disorder)   GAD (generalized anxiety disorder)   Self-injurious behavior   Fibromyalgia     Reason for admission:  [name] is a 31 y.o. female with PMH of  schizoaffective-bipolar type, GAD, PTSD, carrier of fragile x syndrome, ASD v asperger syndrome, borderline personality, NSSIB (cutting), fibromyalgia, multiple suicide attempt, multiple inpt psych admission, who presented voluntary to [facility] (07/29/2022) via GPD, then transferred Voluntary to [facility] (07/29/2022) for suicidal ideation    PTA/Home Rx: cyclobenzaprine 10 mg daily, lexapro 20 mg daily, gabapentin 600 TID, zyprexa 10 mg at bedtime, pantoprazole 40 mg daily, prazosin 1 mg at bedtime, trazodone 50 mg at bedtime,  Psych med management:  CST services from [facility].  Psychotherapy:  CST services from [facility].  [name]   Hospital Course:    During the patient's hospitalization, patient had extensive initial psychiatric evaluation, and follow-up psychiatric evaluations every day.   Psychiatric diagnoses provided upon initial assessment:    Schizoaffective disorder, depressive type (HCC) Active Problems:   Borderline personality disorder (HCC)   Asperger syndrome   PTSD (post-traumatic stress disorder)   GAD (generalized anxiety disorder)   Self-injurious behavior   Fibromyalgia   Patient's psychiatric medications were adjusted on admission:  Continued home lexapro 20 mg daily Continued home zyprexa 10 mg qHS  Continued home gabapentin 600 mg TID Continued home flexeril 10 mg qHS Continued home protoniz EC 40 mg daily    During the hospitalization, other adjustments were made to the  patient's psychiatric medication regimen:  Continued home lexapro 20 mg daily Continued home zyprexa 10 mg qHS  STARTED MV+Fe Continued home gabapentin 600 mg TID Continued home flexeril 10 mg qHS Continued home protoniz EC 40 mg daily    During the hospitalization lab/imaging obtained: BMI:37.81  QTc: 479 CMP WNL CBC showed WBC 10.8, Hb 11, MCV 79.4, otherwise WNL Tylenol level <10  Salicylate level <7  UDS + negative BAL <10 A1c 5.2 (04/20/2022)  Lipid Panel: Trig 163, LDL 106, otherwise WNL (04/20/2022) TSH 1.353 (05/13/2022)  Upreg negative   Patient's care was discussed during the interdisciplinary team meeting every day during the hospitalization.   Patient's side effects to prescribed psychiatric medications: NA   Assessment   Gradually, patient started adjusting to milieu. The patient was evaluated each day by a clinical provider to ascertain response to treatment. Improvement was noted by the patient's report of decreasing symptoms, improved sleep and appetite, affect, medication tolerance, behavior, and participation in unit programming.  Patient was asked each day to complete a self inventory noting mood, mental status, pain, new symptoms, anxiety and concerns.     Symptoms were reported as significantly decreased or resolved completely by discharge.    On day of discharge, the patient reports that their mood is stable. The patient denied having suicidal thoughts for more than 48 hours prior to discharge.  Patient denies having homicidal thoughts.  Patient denies having auditory hallucinations.  Patient denies any visual hallucinations or other symptoms of psychosis. The patient was motivated to continue taking medication with a goal of continued improvement in mental health.    The patient reports their target psychiatric symptoms of suicidal ideation responded well to the psychiatric medications, and the patient reports overall   benefit other psychiatric hospitalization.  Supportive psychotherapy was provided to the patient. The patient also participated in regular group therapy while hospitalized. Coping skills, problem solving as well as relaxation therapies were also part of the unit programming.   Labs were reviewed with the patient, and abnormal results were discussed with the patient.   The patient is able to verbalize their individual safety plan to this provider.   Behavioral Events: NA   Restraints: NA   Groups: Engaged, appropriate     Medication List    CONTINUE taking these medications    albuterol 108 (90 Base) MCG/ACT inhaler; Commonly known as: VENTOLIN HFA  Breyna 160-4.5 MCG/ACT inhaler; Generic drug: budesonide-formoterol  cyclobenzaprine 10 MG tablet; Commonly known as: FLEXERIL  diclofenac Sodium 1 % Gel; Commonly known as: VOLTAREN; Apply 2 g  topically 2 (two) times daily.  escitalopram 20 MG tablet; Commonly known as: LEXAPRO; Take 1 tablet (20  mg total) by mouth daily.  gabapentin 600 MG tablet; Commonly known as: NEURONTIN  hydrOXYzine 25 MG tablet; Commonly known as: ATARAX; Take 1 tablet (25  mg total) by mouth 3 (three) times daily as needed for anxiety.  melatonin 3 MG Tabs tablet; Take 1 tablet (3 mg total) by mouth at  bedtime.  OLANZapine zydis 10 MG disintegrating tablet; Commonly known as:  ZYPREXA; Take 1 tablet (10 mg total) by mouth at bedtime.  pantoprazole 40 MG tablet; Commonly known as: PROTONIX  prazosin 1 MG capsule; Commonly known as: MINIPRESS   STOP taking these medications    benztropine 1 MG tablet; Commonly known as: COGENTIN      Musculoskeletal: Strength & Muscle Tone: within normal limits Gait & Station: normal Patient leans: N/A    Presentation  General Appearance:Appropriate for Environment, Casual, Fairly Groomed Eye Contact:Good Speech:Clear and Coherent, Normal Rate Volume:Normal Handedness:Right   Mood and Affect  Mood:Euthymic Affect:Appropriate, Congruent, Constricted    Thought Process  Thought Process:Coherent, Goal Directed, Linear Descriptions of Associations:Intact   Thought Content Suicidal Thoughts:Suicidal Thoughts: No (last time was 6/24, passive) Homicidal Thoughts:Homicidal Thoughts: No Hallucinations:Hallucinations: None (last time 6/25 AM, saying to throw trashcan which [name] did not) Ideas of Reference:None Thought Content:Perseveration   Sensorium  Memory:Immediate Good Judgment:Impaired Insight:Shallow   Executive Functions  Orientation:Full (Time, Place and Person) Language:Good Concentration:Good Attention:Good Recall:Good Fund of Knowledge:Good   Psychomotor Activity  Psychomotor Activity:Psychomotor Activity: Increased   Assets  Assets:Communication Skills, Desire for Improvement, Leisure Time, Housing   Sleep  Quality:Good   Physical Exam Vitals and nursing note reviewed.  Constitutional:      General: [name] is awake. [name] is not in acute distress.    Appearance: [name] is not ill-appearing or diaphoretic.  HENT:     Head: Normocephalic.  Pulmonary:     Effort: Pulmonary effort is normal. No respiratory distress.  Neurological:     Mental Status: [name] is alert.      Review of Systems  Respiratory:  Negative for shortness of breath.   Cardiovascular:  Negative for chest pain.  Gastrointestinal:  Positive for constipation. Negative for abdominal pain, nausea and vomiting.  Neurological:  Negative for dizziness and headaches.      Blood pressure 137/69, pulse (!) 109, temperature 97.7 F (36.5 C), temperature source Oral, resp. rate 18, height 5' 4" (1.626 m), weight 100 kg, last menstrual period 07/22/2022, SpO2 99 %. Body mass index is 37.83 kg/m.   Mental Status Per Nursing Assessment::   On Admission:  Suicidal ideation indicated by patient,   Self-harm behaviors, Suicide plan, Plan includes specific time, place, or method, Self-harm thoughts, Intention to act on plan to harm others   Demographic Factors:   Unemployed   Loss Factors:  Decrease in vocational status   Historical Factors: Prior suicide attempts, Family history of mental illness or substance abuse, Impulsivity   Risk Reduction Factors:   Responsible for children under 18 years of age, Sense of responsibility to family, Living with another person, especially a relative, Positive social support, Positive therapeutic relationship, and Positive coping skills or problem solving skills   Continued Clinical Symptoms:  More than one psychiatric diagnosis Previous Psychiatric Diagnoses and Treatments   Cognitive Features That Contribute To Risk:  Loss of executive function     Suicide Risk:  Mild: There are no identifiable suicide plans, no associated intent, mild dysphoria and related symptoms, good self-control (both objective and subjective assessment), few other risk factors, and identifiable protective factors, including available and accessible social support      Follow-up Information       [facility]. Call.   Why: Please continue with this provider for CST services.  If you have any questions, please call Donna at [phone] Contact information: [address]  [address] [place] [address] [phone]                          Discharge recommendations:     # It is recommended to the patient to continue psychiatric medications as prescribed, after discharge from the hospital.     # It is recommended to the patient to follow up with your outpatient psychiatric provider -instructions on appointment date, time, and address (location) are provided to you in discharge paperwork   # Follow-up with outpatient primary care doctor and other specialists -for management of chronic medical disease, including:  GERD, tachycardia, pain    # Testing: Follow-up with outpatient provider for abnormal lab results:  See above   # It was discussed with the patient, the impact of alcohol, drugs, tobacco have been there  overall psychiatric and medical wellbeing, and total abstinence from substance use was recommended to the patient.   # Prescriptions provided or sent directly to preferred pharmacy at discharge. Patient agreeable to plan. Given opportunity to ask questions. Appears to feel comfortable with discharge.    # In the event of worsening symptoms, the patient is instructed to call the crisis hotline, 911 and or go to the nearest ED for appropriate evaluation and treatment of symptoms. To follow-up with primary care provider for other medical issues, concerns and or health care needs   Patient agrees with D/C instructions and plan.    Total Time Spent in Direct Patient Care: See attending attestation.   Signed: Arlis Yale, DO Psychiatry Resident, PGY-2 [facility] - Adult  [address] [place], [place] [address] Ph: [phone] Fax: [phone] 08/02/2022, 8:48 AM   

## 2022-08-02 NOTE — Transportation (Signed)
08/02/2022  [name] DOB: 1990/03/18 MRN: [id number]   RIDER WAIVER AND RELEASE OF LIABILITY  For the purposes of helping with transportation needs, [facility] partners with outside transportation providers (taxi companies, [place], Catering manager.) to give [employer] patients or other approved people the choice of on-demand rides [employer]") to our buildings for non-emergency visits.  By using [employer], I, the person signing this document, on behalf of myself and/or any legal minors (in my care using the [employer]), agree:  Science writer given to me are supplied by independent, outside transportation providers who do not work for, or have any affiliation with, [employer]. [place] is not a transportation company. [facility] has no control over the quality or safety of the rides I get using [employer]. [place] has no control over whether any outside ride will happen on time or not. [facility] gives no guarantee on the reliability, quality, safety, or availability on any rides, or that no mistakes will happen. I know and accept that traveling by vehicle (car, truck, [employer], Zenaida Niece, bus, taxi, etc.) has risks of serious injuries such as disability, being paralyzed, and death. I know and agree the risk of using [employer] is mine alone, and not [employer]. Transport Services are provided "as is" and as are available. The transportation providers are in charge for all inspections and care of the vehicles used to provide these rides. I agree not to take legal action against [place], its agents, employees, officers, directors, representatives, insurers, attorneys, assigns, successors, subsidiaries, and affiliates at any time for any reasons related directly or indirectly to using [employer]. I also agree not to take legal action against [facility] or its affiliates for any injury, death, or damage to property caused by or related to  using [employer]. I have read this Waiver and Release of Liability, and I understand the terms used in it and their legal meaning. This Waiver is freely and voluntarily given with the understanding that my right (or any legal minors) to legal action against  relating to [employer] is knowingly given up to use these services.   I attest that I read the Ride Waiver and Release of Liability to [name], gave Ms. Gram the opportunity to ask questions and answered the questions asked (if any). I affirm that [name] then provided consent for assistance with transportation.

## 2022-08-02 NOTE — Progress Notes (Signed)
   08/02/22 1610  15 Minute Checks  Location Hallway  Visual Appearance Calm  Behavior Composed  Sleep (Behavioral Health Patients Only)  Calculate sleep? (Click Yes once per 24 hr at 0600 safety check) Yes  Documented sleep last 24 hours 8

## 2022-08-02 NOTE — [facility] Suicide Risk Assessment (Signed)
[facility] Discharge Suicide Risk Assessment   Principal Problem: Schizoaffective disorder, depressive type (HCC) Discharge Diagnoses: Principal Problem:   Schizoaffective disorder, depressive type (HCC) Active Problems:   Borderline personality disorder (HCC)   Asperger syndrome   PTSD (post-traumatic stress disorder)   GAD (generalized anxiety disorder)   Self-injurious behavior   Fibromyalgia     Reason for admission:  [name] is a 32 y.o. female with PMH of  schizoaffective-bipolar type, GAD, PTSD, carrier of fragile x syndrome, ASD v asperger syndrome, borderline personality, NSSIB (cutting), fibromyalgia, multiple suicide attempt, multiple inpt psych admission, who presented voluntary to [facility] (07/29/2022) via GPD, then transferred Voluntary to [facility] (07/29/2022) for suicidal ideation    PTA/Home Rx: cyclobenzaprine 10 mg daily, lexapro 20 mg daily, gabapentin 600 TID, zyprexa 10 mg at bedtime, pantoprazole 40 mg daily, prazosin 1 mg at bedtime, trazodone 50 mg at bedtime,  Psych med management:  CST services from [facility].  Psychotherapy:  CST services from [facility].  [facility]: [facility] Course:    During the patient's hospitalization, patient had extensive initial psychiatric evaluation, and follow-up psychiatric evaluations every day.   Psychiatric diagnoses provided upon initial assessment:    Schizoaffective disorder, depressive type (HCC) Active Problems:   Borderline personality disorder (HCC)   Asperger syndrome   PTSD (post-traumatic stress disorder)   GAD (generalized anxiety disorder)   Self-injurious behavior   Fibromyalgia   Patient's psychiatric medications were adjusted on admission:  Continued home lexapro 20 mg daily Continued home zyprexa 10 mg qHS  Continued home gabapentin 600 mg TID Continued home flexeril 10 mg qHS Continued home protoniz EC 40 mg daily    During the hospitalization, other adjustments were made to the  patient's psychiatric medication regimen:  Continued home lexapro 20 mg daily Continued home zyprexa 10 mg qHS  STARTED MV+Fe Continued home gabapentin 600 mg TID Continued home flexeril 10 mg qHS Continued home protoniz EC 40 mg daily    During the hospitalization lab/imaging obtained: BMI:37.81  QTc: 479 CMP WNL CBC showed WBC 10.8, Hb 11, MCV 79.4, otherwise WNL Tylenol level <10  Salicylate level <7  UDS + negative BAL <10 A1c 5.2 (04/20/2022)  Lipid Panel: Trig 163, LDL 106, otherwise WNL (04/20/2022) TSH 1.353 (05/13/2022)  Upreg negative   Patient's care was discussed during the interdisciplinary team meeting every day during the hospitalization.   Patient's side effects to prescribed psychiatric medications: NA   Assessment   Gradually, patient started adjusting to milieu. The patient was evaluated each day by a clinical provider to ascertain response to treatment. Improvement was noted by the patient's report of decreasing symptoms, improved sleep and appetite, affect, medication tolerance, behavior, and participation in unit programming.  Patient was asked each day to complete a self inventory noting mood, mental status, pain, new symptoms, anxiety and concerns.     Symptoms were reported as significantly decreased or resolved completely by discharge.    On day of discharge, the patient reports that their mood is stable. The patient denied having suicidal thoughts for more than 48 hours prior to discharge.  Patient denies having homicidal thoughts.  Patient denies having auditory hallucinations.  Patient denies any visual hallucinations or other symptoms of psychosis. The patient was motivated to continue taking medication with a goal of continued improvement in mental health.    The patient reports their target psychiatric symptoms of suicidal ideation responded well to the psychiatric medications, and the patient reports overall  benefit other psychiatric hospitalization.  Supportive psychotherapy was provided to the patient. The patient also participated in regular group therapy while hospitalized. Coping skills, problem solving as well as relaxation therapies were also part of the unit programming.   Labs were reviewed with the patient, and abnormal results were discussed with the patient.   The patient is able to verbalize their individual safety plan to this provider.   Behavioral Events: NA   Restraints: NA   Groups: Engaged, appropriate     Medication List    CONTINUE taking these medications    albuterol 108 (90 Base) MCG/ACT inhaler; Commonly known as: VENTOLIN HFA  Breyna 160-4.5 MCG/ACT inhaler; Generic drug: budesonide-formoterol  cyclobenzaprine 10 MG tablet; Commonly known as: FLEXERIL  diclofenac Sodium 1 % Gel; Commonly known as: VOLTAREN; Apply 2 g  topically 2 (two) times daily.  escitalopram 20 MG tablet; Commonly known as: LEXAPRO; Take 1 tablet (20  mg total) by mouth daily.  gabapentin 600 MG tablet; Commonly known as: NEURONTIN  hydrOXYzine 25 MG tablet; Commonly known as: ATARAX; Take 1 tablet (25  mg total) by mouth 3 (three) times daily as needed for anxiety.  melatonin 3 MG Tabs tablet; Take 1 tablet (3 mg total) by mouth at  bedtime.  OLANZapine zydis 10 MG disintegrating tablet; Commonly known as:  ZYPREXA; Take 1 tablet (10 mg total) by mouth at bedtime.  pantoprazole 40 MG tablet; Commonly known as: PROTONIX  prazosin 1 MG capsule; Commonly known as: MINIPRESS   STOP taking these medications    benztropine 1 MG tablet; Commonly known as: COGENTIN      Musculoskeletal: Strength & Muscle Tone: within normal limits Gait & Station: normal Patient leans: N/A    Presentation  General Appearance:Appropriate for Environment, Casual, Fairly Groomed Eye Contact:Good Speech:Clear and Coherent, Normal Rate Volume:Normal Handedness:Right   Mood and Affect  Mood:Euthymic Affect:Appropriate, Congruent, Constricted    Thought Process  Thought Process:Coherent, Goal Directed, Linear Descriptions of Associations:Intact   Thought Content Suicidal Thoughts:Suicidal Thoughts: No (last time was 6/24, passive) Homicidal Thoughts:Homicidal Thoughts: No Hallucinations:Hallucinations: None (last time 6/25 AM, saying to throw trashcan which she did not) Ideas of Reference:None Thought Content:Perseveration   Sensorium  Memory:Immediate Good Judgment:Impaired Insight:Shallow   Executive Functions  Orientation:Full (Time, Place and Person) Language:Good Concentration:Good Attention:Good Recall:Good Fund of Knowledge:Good   Psychomotor Activity  Psychomotor Activity:Psychomotor Activity: Increased   Assets  Assets:Communication Skills, Desire for Improvement, Leisure Time, Housing   Sleep  Quality:Good   Physical Exam Vitals and nursing note reviewed.  Constitutional:      General: She is awake. She is not in acute distress.    Appearance: She is not ill-appearing or diaphoretic.  HENT:     Head: Normocephalic.  Pulmonary:     Effort: Pulmonary effort is normal. No respiratory distress.  Neurological:     Mental Status: She is alert.      Review of Systems  Respiratory:  Negative for shortness of breath.   Cardiovascular:  Negative for chest pain.  Gastrointestinal:  Positive for constipation. Negative for abdominal pain, nausea and vomiting.  Neurological:  Negative for dizziness and headaches.      Blood pressure 137/69, pulse (!) 109, temperature 97.7 F (36.5 C), temperature source Oral, resp. rate 18, height 5\' 4"  (1.626 m), weight 100 kg, last menstrual period 07/22/2022, SpO2 99 %. Body mass index is 37.83 kg/m.   Mental Status Per Nursing Assessment::   On Admission:  Suicidal ideation indicated by patient,  Self-harm behaviors, Suicide plan, Plan includes specific time, place, or method, Self-harm thoughts, Intention to act on plan to harm others   Demographic Factors:   Unemployed   Loss Factors:  Decrease in vocational status   Historical Factors: Prior suicide attempts, Family history of mental illness or substance abuse, Impulsivity   Risk Reduction Factors:   Responsible for children under 83 years of age, Sense of responsibility to family, Living with another person, especially a relative, Positive social support, Positive therapeutic relationship, and Positive coping skills or problem solving skills   Continued Clinical Symptoms:  More than one psychiatric diagnosis Previous Psychiatric Diagnoses and Treatments   Cognitive Features That Contribute To Risk:  Loss of executive function     Suicide Risk:  Mild: There are no identifiable suicide plans, no associated intent, mild dysphoria and related symptoms, good self-control (both objective and subjective assessment), few other risk factors, and identifiable protective factors, including available and accessible social support      Follow-up Information       [facility]. Call.   Why: Please continue with this provider for CST services.  If you have any questions, please call Lupita Leash at [phone] Contact information: [address]  [address] [place] [address] [phone]                          Discharge recommendations:     # It is recommended to the patient to continue psychiatric medications as prescribed, after discharge from the hospital.     # It is recommended to the patient to follow up with your outpatient psychiatric provider -instructions on appointment date, time, and address (location) are provided to you in discharge paperwork   # Follow-up with outpatient primary care doctor and other specialists -for management of chronic medical disease, including:  GERD, tachycardia, pain    # Testing: Follow-up with outpatient provider for abnormal lab results:  See above   # It was discussed with the patient, the impact of alcohol, drugs, tobacco have been there  overall psychiatric and medical wellbeing, and total abstinence from substance use was recommended to the patient.   # Prescriptions provided or sent directly to preferred pharmacy at discharge. Patient agreeable to plan. Given opportunity to ask questions. Appears to feel comfortable with discharge.    # In the event of worsening symptoms, the patient is instructed to call the crisis hotline, 911 and or go to the nearest ED for appropriate evaluation and treatment of symptoms. To follow-up with primary care provider for other medical issues, concerns and or health care needs   Patient agrees with D/C instructions and plan.    Total Time Spent in Direct Patient Care: See attending attestation.   Signed: Princess Bruins, DO Psychiatry Resident, PGY-2 [facility] - Adult  [address] [place], [place] [address] Ph: [phone] Fax: [phone] 08/02/2022, 8:48 AM

## 2022-08-02 NOTE — Progress Notes (Signed)
Discharge Note:  Patient denies SI/HI/AVH at this time. Discharge instructions, AVS, prescriptions, and transition record gone over with patient. Patient agrees to comply with medication management, follow-up visit, and outpatient therapy. Patient belongings returned to patient. Patient questions and concerns addressed and answered. Patient ambulatory off unit.  

## 2022-08-02 NOTE — Progress Notes (Addendum)
Patient denies SI, HI and AVH and states is ready for discharge. Patient presents flat and answering only yes or no. Patient's heart rate is elevated this morning at 116 and 109 bpm rechecked. Patient states she has a follow up appointment with her regular provider to treat tachycardia. Patient remains safe on the unit, Q 15 minute checks ongoing.   08/02/22 0800  Psych Admission Type (Psych Patients Only)  Admission Status Voluntary  Psychosocial Assessment  Patient Complaints None  Eye Contact Fair  Facial Expression Flat  Affect Flat  Speech Logical/coherent  Interaction Guarded  Motor Activity Slow  Appearance/Hygiene Unremarkable  Behavior Characteristics Cooperative  Mood Pleasant  Thought Process  Coherency WDL  Content WDL  Delusions None reported or observed  Perception WDL  Hallucination None reported or observed  Judgment WDL  Confusion None  Danger to Self  Current suicidal ideation? Denies  Self-Injurious Behavior No self-injurious ideation or behavior indicators observed or expressed   Agreement Not to Harm Self Yes  Description of Agreement verbal  Danger to Others  Danger to Others None reported or observed  Danger to Others Abnormal  Harmful Behavior to others No threats or harm toward other people

## 2022-08-02 NOTE — Progress Notes (Signed)
  [facility] Adult Case Management Discharge Plan :  Will you be returning to the same living situation after discharge:  Yes,  pt has her own apt and is will be returning home At discharge, do you have transportation home?: Yes,  pt will be transported via [facility].  Do you have the ability to pay for your medications: Yes,  pt has active medical coverage  Release of information consent forms completed and in the chart;  Patient's signature needed at discharge.  Patient to Follow up at:  Follow-up Information     [facility]. Call.   Why: Please continue with this provider for CST services.  If you have any questions, please call Lupita Leash at [phone] Contact information: [address]  [address] [place] [address] [phone]                 Next level of care provider has access to [facility] Link:no  Safety Planning and Suicide Prevention discussed: Yes,  SPE discussed and pamphlet will be given at the time of discharge     Has patient been referred to the Quitline?: Patient does not use tobacco/nicotine products  Patient has been referred for addiction treatment: No known substance use disorder.  Riah Kehoe, Candace Cruise, LCSW 08/02/2022, 9:17 AM

## 2022-08-02 NOTE — Hospital Course (Signed)
Hospital Course:   During the patient's hospitalization, patient had extensive initial psychiatric evaluation, and follow-up psychiatric evaluations every day.  Psychiatric diagnoses provided upon initial assessment:    Schizoaffective disorder, depressive type (HCC) Active Problems:   Borderline personality disorder (HCC)   Asperger syndrome   PTSD (post-traumatic stress disorder)   GAD (generalized anxiety disorder)   Self-injurious behavior   Fibromyalgia  Patient's psychiatric medications were adjusted on admission:  Continued home lexapro 20 mg daily Continued home zyprexa 10 mg qHS  Continued home gabapentin 600 mg TID Continued home flexeril 10 mg qHS Continued home protoniz EC 40 mg daily   During the hospitalization, other adjustments were made to the patient's psychiatric medication regimen:  Continued home lexapro 20 mg daily Continued home zyprexa 10 mg qHS  STARTED MV+Fe Continued home gabapentin 600 mg TID Continued home flexeril 10 mg qHS Continued home protoniz EC 40 mg daily   During the hospitalization lab/imaging obtained: BMI:37.81  QTc: 479 CMP WNL CBC showed WBC 10.8, Hb 11, MCV 79.4, otherwise WNL Tylenol level <10  Salicylate level <7  UDS + negative BAL <10 A1c 5.2 (04/20/2022)  Lipid Panel: Trig 163, LDL 106, otherwise WNL (04/20/2022) TSH 1.353 (05/13/2022)  Upreg negative  Patient's care was discussed during the interdisciplinary team meeting every day during the hospitalization.  Patient's side effects to prescribed psychiatric medications: NA  Assessment  Gradually, patient started adjusting to milieu. The patient was evaluated each day by a clinical provider to ascertain response to treatment. Improvement was noted by the patient's report of decreasing symptoms, improved sleep and appetite, affect, medication tolerance, behavior, and participation in unit programming.  Patient was asked each day to complete a self inventory noting mood,  mental status, pain, new symptoms, anxiety and concerns.    Symptoms were reported as significantly decreased or resolved completely by discharge.   On day of discharge, the patient reports that their mood is stable. The patient denied having suicidal thoughts for more than 48 hours prior to discharge.  Patient denies having homicidal thoughts.  Patient denies having auditory hallucinations.  Patient denies any visual hallucinations or other symptoms of psychosis. The patient was motivated to continue taking medication with a goal of continued improvement in mental health.   The patient reports their target psychiatric symptoms of suicidal ideation responded well to the psychiatric medications, and the patient reports overall benefit other psychiatric hospitalization. Supportive psychotherapy was provided to the patient. The patient also participated in regular group therapy while hospitalized. Coping skills, problem solving as well as relaxation therapies were also part of the unit programming.  Labs were reviewed with the patient, and abnormal results were discussed with the patient.  The patient is able to verbalize their individual safety plan to this provider.  Behavioral Events: NA  Restraints: NA  Groups: Engaged, appropriate

## 2022-08-02 NOTE — H&P (Deleted)
Physician Discharge Summary Note  Patient Identification: [name], 32 y.o. female  MRN: [id number] DOB: 1990-07-16  Date of Evaluation: 08/02/2022, 8:51 AM Bed: 0402/0402-02 Patient phone: [phone] (home)  Patient address:   [address] [place] [address]  Date of Admission: 07/29/2022 Date of Discharge: 08/02/2022  Reason for Admission:   [name] is a 32 y.o. female with PMH of  schizoaffective-bipolar type, GAD, PTSD, carrier of fragile x syndrome, ASD v asperger syndrome, borderline personality, NSSIB (cutting), fibromyalgia, multiple suicide attempt, multiple inpt psych admission, who presented voluntary to [facility] (07/29/2022) via GPD, then transferred Voluntary to [facility] (07/29/2022) for suicidal ideation   PTA/Home Rx: cyclobenzaprine 10 mg daily, lexapro 20 mg daily, gabapentin 600 TID, zyprexa 10 mg at bedtime, pantoprazole 40 mg daily, prazosin 1 mg at bedtime, trazodone 50 mg at bedtime,  Psych med management:  CST services from [place].  Psychotherapy:  CST services from [place].  PCP: [facility]  Past Psychiatric History:  Previous Psych Diagnoses: Schizoaffective disorder bipolar type, Anxiety, Autism spectrum disorder, PTSD, Borderline personality disorder Prior inpatient treatment: multiple Current/prior outpatient treatment: [facility] Prior rehab hx: denies multiple Psychotherapy hx: [facility] History of suicide: 20-30 times by overdosing on pills, cutting or drinking 409 disinfectant History of homicide or aggression: denies Psychiatric medication history:  Previously trialed Tegretol, Haldol, Clozaril, Depakote, Invega, Lamictal, Seroquel, asenapine, abilify, vraylar, Lexapro, Zoloft  Neuromodulation history: denies   Past Medical/Surgical History:  Medical Diagnoses: Asthma, Seizure once in 2020, Fibromyalgia, Asperger's Syndrome, Acid Reflux  Home Rx: Protonix, Prazosin, Metoprolol, Vraylar, Lyrica,  Lexapro  Prior Hosp: Patient has had 6 inpatient visits in the last 5 months Prior Surgeries/Trauma: No surgeries  Head trauma, LOC, concussions, seizures: seizure in 2020 PCP: [facility] - Dr. Wynelle Link Allergies: Bee venom, Coconut flavor, Fish allergy, Geodon [ziprasidone hcl], Haloperidol and related, Lithobid [lithium], Roxicodone [oxycodone], Seroquel [quetiapine], Shellfish allergy, Phenergan [promethazine hcl], Prilosec [omeprazole], Sulfa antibiotics, Tegretol [carbamazepine], Prozac [fluoxetine], Tape, and Tylenol [acetaminophen]    Family Psychiatric History:  Medical: denies  Psych: Father has PTSD, Mother and Brother has Autism, Brother has depression  Psych Rx: SA/HA: denies  Substance use family hx: denies    Social History:  Childhood (bring, raised, lives now, parents, siblings, schooling, education): Patient was born and raised in [place], [place], patient has 2 brothers, patient has 3 associate degrees.  Abuse: Sexual, Physical, Emotional, Verbal  Marital Status: widow Sexual orientation: heterosexual  Children:1 daughter  Employment: unemployed Peer Group: CST Housing: Patient rents an apartment with a friend Finances: Patient receives disability  Legal: denies Hotel manager: denies   Substance Use History: Alcohol: denies Tobacco: denies Illicit drugs: denies Rx drug abuse: denies Rehab hx: denies   Principal Problem: Schizoaffective disorder, depressive type (HCC) Discharge Diagnoses: Principal Problem:   Schizoaffective disorder, depressive type (HCC) Active Problems:   Borderline personality disorder (HCC)   Asperger syndrome   PTSD (post-traumatic stress disorder)   GAD (generalized anxiety disorder)   Self-injurious behavior   Fibromyalgia  Past Psychiatric History: Per above Past Medical History:  Past Medical History:  Diagnosis Date   Acid reflux    Adjustment disorder with mixed anxiety and depressed mood 01/31/2022   Adjustment  disorder with mixed disturbance of emotions and conduct 08/03/2019   Anxiety    Asthma    last attack 03/13/15 or 03/14/15   Autism    Bipolar 1 disorder, depressed, severe (HCC) 07/25/2021   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    Chronic constipation    Depression    Drug-seeking behavior    Essential tremor    Headache    Ineffective individual coping 05/16/2022   Insomnia 01/12/2022   Intentional drug overdose (HCC) 06/05/2022   Neuromuscular disorder (HCC)    Normocytic anemia 06/05/2022   Overdose 07/22/2017   Overdose of acetaminophen 07/2017   and other meds   Overdose, intentional self-harm, initial encounter (HCC) 07/20/2021   Paranoia (HCC) 04/22/2021   Personality disorder (HCC)    Purposeful non-suicidal drug ingestion (HCC) 06/27/2021   Schizo-affective psychosis (HCC)    Schizoaffective disorder (HCC) 07/29/2022   Schizoaffective disorder, bipolar type (HCC)    Seizures (HCC)    Last seizure December 2017   Skin erythema 04/27/2022   Sleep apnea    Suicidal behavior 07/25/2021   Suicidal ideation    Suicide (HCC) 07/01/2021   Suicide attempt (HCC) 07/04/2021    Past Surgical History:  Procedure Laterality Date   MOUTH SURGERY  2009 or 2010   Family History:  Family History  Problem Relation Age of Onset   Mental illness Father    Asthma Father    PDD Brother    Seizures Brother    Family Psychiatric  History: Per above Social History:  Social History   Substance and Sexual Activity  Alcohol Use No   Alcohol/week: 1.0 standard drink of alcohol   Types: 1 Standard drinks or equivalent per week   Comment: denies at this time    Social History   Substance and Sexual Activity  Drug Use No   Comment: History of cocaine use at age 10 for 4 months    Social History   Socioeconomic History   Marital status: Widowed    Spouse name: Not on file   Number of children: 0   Years of education: Not on file   Highest education level: Not on file   Occupational History   Occupation: disability  Tobacco Use   Smoking status: Former    Packs/day: 0    Types: Cigarettes   Smokeless tobacco: Never   Tobacco comments:    Smoked for 2  years age [age]  Vaping Use   Vaping Use: Never used  Substance and Sexual Activity   Alcohol use: No    Alcohol/week: 1.0 standard drink of alcohol    Types: 1 Standard drinks or equivalent per week    Comment: denies at this time   Drug use: No    Comment: History of cocaine use at age 66 for 4 months   Sexual activity: Not Currently    Birth control/protection: None  Other Topics Concern   Not on file  Social History Narrative   Marital status: Widowed      Children: daughter      Lives: with boyfriend, in two story home      Employment:  Disability      Tobacco: quit smoking; smoked for two years.      Alcohol ;none      Drugs: none   Has not traveled outside of the country.   Right handed         Patient with hx of Fibromyalgia,Orthostatic Tachycardia,Asthma,Arthritis,Gerd, ASD   Social Determinants of Health   Financial Resource Strain: Not on file  Food Insecurity: No Food Insecurity (07/29/2022)   Hunger Vital Sign    Worried About Running Out of Food in the Last Year: Never true    Ran Out of Food  in the Last Year: Never true  Transportation Needs: No Transportation Needs (07/29/2022)   PRAPARE - Administrator, Civil Service (Medical): No    Lack of Transportation (Non-Medical): No  Recent Concern: Transportation Needs - Unmet Transportation Needs (05/06/2022)   PRAPARE - Administrator, Civil Service (Medical): Yes    Lack of Transportation (Non-Medical): Yes  Physical Activity: Not on file  Stress: Not on file  Social Connections: Not on file    Hospital Course:   During the patient's hospitalization, patient had extensive initial psychiatric evaluation, and follow-up psychiatric evaluations every day.  Psychiatric diagnoses provided upon  initial assessment:    Schizoaffective disorder, depressive type (HCC) Active Problems:   Borderline personality disorder (HCC)   Asperger syndrome   PTSD (post-traumatic stress disorder)   GAD (generalized anxiety disorder)   Self-injurious behavior   Fibromyalgia  Patient's psychiatric medications were adjusted on admission:  Continued home lexapro 20 mg daily Continued home zyprexa 10 mg qHS  Continued home gabapentin 600 mg TID Continued home flexeril 10 mg qHS Continued home protoniz EC 40 mg daily   During the hospitalization, other adjustments were made to the patient's psychiatric medication regimen:  Continued home lexapro 20 mg daily Continued home zyprexa 10 mg qHS  STARTED MV+Fe Continued home gabapentin 600 mg TID Continued home flexeril 10 mg qHS Continued home protoniz EC 40 mg daily   During the hospitalization lab/imaging obtained: BMI:37.81  QTc: 479 CMP WNL CBC showed WBC 10.8, Hb 11, MCV 79.4, otherwise WNL Tylenol level <10  Salicylate level <7  UDS + negative BAL <10 A1c 5.2 (04/20/2022)  Lipid Panel: Trig 163, LDL 106, otherwise WNL (04/20/2022) TSH 1.353 (05/13/2022)  Upreg negative  Patient's care was discussed during the interdisciplinary team meeting every day during the hospitalization.  Patient's side effects to prescribed psychiatric medications: NA  Assessment  Gradually, patient started adjusting to milieu. The patient was evaluated each day by a clinical provider to ascertain response to treatment. Improvement was noted by the patient's report of decreasing symptoms, improved sleep and appetite, affect, medication tolerance, behavior, and participation in unit programming.  Patient was asked each day to complete a self inventory noting mood, mental status, pain, new symptoms, anxiety and concerns.    Symptoms were reported as significantly decreased or resolved completely by discharge.   On day of discharge, the patient reports that their  mood is stable. The patient denied having suicidal thoughts for more than 48 hours prior to discharge.  Patient denies having homicidal thoughts.  Patient denies having auditory hallucinations.  Patient denies any visual hallucinations or other symptoms of psychosis. The patient was motivated to continue taking medication with a goal of continued improvement in mental health.   The patient reports their target psychiatric symptoms of suicidal ideation responded well to the psychiatric medications, and the patient reports overall benefit other psychiatric hospitalization. Supportive psychotherapy was provided to the patient. The patient also participated in regular group therapy while hospitalized. Coping skills, problem solving as well as relaxation therapies were also part of the unit programming.  Labs were reviewed with the patient, and abnormal results were discussed with the patient.  The patient is able to verbalize their individual safety plan to this provider.  Behavioral Events: NA  Restraints: NA  Groups: Engaged, appropriate  While future psychiatric events cannot be accurately predicted, the patient does not currently require acute inpatient psychiatric care and does not currently meet [facility] involuntary  commitment criteria.  # It is recommended to the patient to continue psychiatric medications as prescribed, after discharge from the hospital.    # It is recommended to the patient to follow up with your outpatient psychiatric provider and PCP.  # It was discussed with the patient, the impact of alcohol, drugs, tobacco have been there overall psychiatric and medical wellbeing, and total abstinence from substance use was recommended to the patient.  # Prescriptions provided or sent directly to preferred pharmacy at discharge. Patient agreeable to plan. Given opportunity to ask questions. Appears to feel comfortable with discharge.    # In the event of worsening symptoms,  the patient is instructed to call the crisis hotline, 911 and or go to the nearest ED for appropriate evaluation and treatment of symptoms. To follow-up with primary care provider for other medical issues, concerns and or health care needs  # Patient was discharged home with family with a plan to follow up as noted below.  Is patient on multiple antipsychotic therapies at discharge:  No   Has Patient had three or more failed trials of antipsychotic monotherapy by history:  No Recommended Plan for Multiple Antipsychotic Therapies: NA Tobacco Cessation:  N/A, patient does not currently use tobacco products  Physical Findings: BP 137/69 (BP Location: Left Arm)   Pulse (!) 109   Temp 97.7 F (36.5 C) (Oral)   Resp 18   Ht 5\' 4"  (1.626 m)   Wt 100 kg   LMP 07/22/2022 (Approximate)   SpO2 99%   BMI 37.83 kg/m   AIMS:  , ,  ,  ,     Physical Exam Vitals and nursing note reviewed.  Constitutional:      General: She is awake. She is not in acute distress.    Appearance: She is not ill-appearing or diaphoretic.  HENT:     Head: Normocephalic.  Pulmonary:     Effort: Pulmonary effort is normal. No respiratory distress.  Neurological:     Mental Status: She is alert.    Review of Systems  Respiratory:  Negative for shortness of breath.   Cardiovascular:  Negative for chest pain.  Gastrointestinal:  Positive for constipation. Negative for abdominal pain, diarrhea, nausea and vomiting.  Neurological:  Negative for dizziness and headaches.    Musculoskeletal: Strength & Muscle Tone: within normal limits Gait & Station: normal Patient leans: N/A   Presentation  General Appearance:Appropriate for Environment, Casual, Fairly Groomed Eye Contact:Good Speech:Clear and Coherent, Normal Rate Volume:Normal Handedness:Right  Mood and Affect  Mood:Euthymic Affect:Appropriate, Congruent, Constricted  Thought Process  Thought Process:Coherent, Goal Directed, Linear Descriptions of  Associations:Intact  Thought Content Suicidal Thoughts:Suicidal Thoughts: No (last time was 6/24, passive) Homicidal Thoughts:Homicidal Thoughts: No Hallucinations:Hallucinations: None (last time 6/25 AM, saying to throw trashcan which she did not) Ideas of Reference:None Thought Content:Perseveration  Sensorium  Memory:Immediate Good Judgment:Impaired Insight:Shallow  Executive Functions  Orientation:Full (Time, Place and Person) Language:Good Concentration:Good Attention:Good Recall:Good Fund of Knowledge:Good  Psychomotor Activity  Psychomotor Activity:Psychomotor Activity: Increased  Assets  Assets:Communication Skills, Desire for Improvement, Leisure Time, Housing  Sleep  Quality:Good  Social History   Tobacco Use  Smoking Status Former   Packs/day: 0   Types: Cigarettes  Smokeless Tobacco Never  Tobacco Comments   Smoked for 2  years age [age]    Blood Alcohol level:  Lab Results  Component Value Date   ETH <10 07/29/2022   ETH <10 07/13/2022    Metabolic Disorder Labs:  Lab Results  Component Value Date   HGBA1C 5.2 04/20/2022   MPG 103 04/20/2022   MPG 96.8 10/07/2021   Lab Results  Component Value Date   PROLACTIN 66.2 (H) 03/02/2021   PROLACTIN 66.1 (H) 02/04/2020   Lab Results  Component Value Date   CHOL 191 04/20/2022   TRIG 163 (H) 04/20/2022   HDL 52 04/20/2022   CHOLHDL 3.7 04/20/2022   VLDL 33 04/20/2022   LDLCALC 106 (H) 04/20/2022   LDLCALC 102 (H) 12/10/2021    Discharge destination: See above  Discharge Instructions     Activity as tolerated - No restrictions   Complete by: As directed    Diet general   Complete by: As directed       Allergies as of 08/02/2022       Reactions   Bee Venom Anaphylaxis   Coconut Flavor Anaphylaxis, Rash   Fish Allergy Anaphylaxis   Geodon [ziprasidone Hcl] Other (See Comments)   Pt states that this medication causes paralysis of the mouth.     Haloperidol And Related Other  (See Comments)   Pt states that this medication causes paralysis of the mouth, jaw locks up   Lithobid [lithium] Other (See Comments)   Seizure-like activity   Roxicodone [oxycodone] Other (See Comments)   Hallucinations    Seroquel [quetiapine] Other (See Comments)   Severe drowsiness Pt currently taking 12.5 mg BID, she is ok with this dose   Shellfish Allergy Anaphylaxis   Phenergan [promethazine Hcl] Other (See Comments)   Chest pain    Prilosec [omeprazole] Nausea And Vomiting   Pt can take protonix with no problems   Sulfa Antibiotics Other (See Comments)   Chest pain    Tegretol [carbamazepine] Nausea And Vomiting   Prozac [fluoxetine] Other (See Comments)   Increased Depression Suicidal thoughts   Tape Other (See Comments)   Skin tears, can only tolerate paper tape.   Tylenol [acetaminophen] Rash, Other (See Comments)   Rash on face         Medication List     STOP taking these medications    benztropine 1 MG tablet Commonly known as: COGENTIN       TAKE these medications      Indication  albuterol 108 (90 Base) MCG/ACT inhaler Commonly known as: VENTOLIN HFA Inhale 2 puffs into the lungs every 6 (six) hours as needed for wheezing or shortness of breath.  Indication: Asthma   Breyna 160-4.5 MCG/ACT inhaler Generic drug: budesonide-formoterol Inhale 1 puff into the lungs in the morning and at bedtime.  Indication: Asthma   cyclobenzaprine 10 MG tablet Commonly known as: FLEXERIL Take 10 mg by mouth at bedtime.  Indication: Fibromyalgia Syndrome   diclofenac Sodium 1 % Gel Commonly known as: VOLTAREN Apply 2 g topically 2 (two) times daily.  Indication: Joint Damage causing Pain and Loss of Function   escitalopram 20 MG tablet Commonly known as: LEXAPRO Take 1 tablet (20 mg total) by mouth daily.  Indication: Generalized Anxiety Disorder   gabapentin 600 MG tablet Commonly known as: NEURONTIN Take 600 mg by mouth 3 (three) times daily.   Indication: Generalized Anxiety Disorder   hydrOXYzine 25 MG tablet Commonly known as: ATARAX Take 1 tablet (25 mg total) by mouth 3 (three) times daily as needed for anxiety.  Indication: Feeling Anxious   melatonin 3 MG Tabs tablet Take 1 tablet (3 mg total) by mouth at bedtime.  Indication: Trouble Sleeping   OLANZapine zydis 10 MG disintegrating tablet Commonly known  as: ZYPREXA Take 1 tablet (10 mg total) by mouth at bedtime.  Indication: Manic Phase of Manic-Depression   pantoprazole 40 MG tablet Commonly known as: PROTONIX Take 40 mg by mouth daily.  Indication: Heartburn   prazosin 1 MG capsule Commonly known as: MINIPRESS Take 1 mg by mouth at bedtime.  Indication: Frightening Dreams        Follow-up Information     [facility]. Call.   Why: Please continue with this provider for CST services.  If you have any questions, please call Lupita Leash at [phone] Contact information: [address]  [address] [place] [address] [phone]                 Discharge recommendations:   # It is recommended to the patient to continue psychiatric medications as prescribed, after discharge from the hospital.     # It is recommended to the patient to follow up with your outpatient psychiatric provider -instructions on appointment date, time, and address (location) are provided to you in discharge paperwork  # Follow-up with outpatient primary care doctor and other specialists -for management of chronic medical disease, including:  GERD, tachycardia, pain  # Testing: Follow-up with outpatient provider for abnormal lab results:  See above   # It was discussed with the patient, the impact of alcohol, drugs, tobacco have been there overall psychiatric and medical wellbeing, and total abstinence from substance use was recommended to the patient.   # Prescriptions provided or sent directly to preferred pharmacy at discharge. Patient agreeable to plan. Given  opportunity to ask questions. Appears to feel comfortable with discharge.    # In the event of worsening symptoms, the patient is instructed to call the crisis hotline, 911 and or go to the nearest ED for appropriate evaluation and treatment of symptoms. To follow-up with primary care provider for other medical issues, concerns and or health care needs  Total Time Spent in Direct Patient Care: See attending attestation.  Signed: Princess Bruins, DO Psychiatry Resident, PGY-2 [facility] - Adult  [address] [place], [place] [address] Ph: [phone] Fax: [phone]

## 2022-08-02 NOTE — Discharge Summary (Signed)
Physician Discharge Summary Note  Patient Identification: [name], 32 y.o. female  MRN: [id number] DOB: 18-Nov-1990  Date of Evaluation: 08/02/2022, 9:15 AM Bed: 0402/0402-02 Patient phone: [phone] (home)  Patient address:   [address] [place] [address]  Date of Admission: 07/29/2022 Date of Discharge: 08/02/2022  Reason for Admission:   [name] is a 32 y.o. female with PMH of  schizoaffective-bipolar type, GAD, PTSD, carrier of fragile x syndrome, ASD v asperger syndrome, borderline personality, NSSIB (cutting), fibromyalgia, multiple suicide attempt, multiple inpt psych admission, who presented voluntary to [facility] (07/29/2022) via [facility], then transferred Voluntary to [facility] (07/29/2022) for suicidal ideation   PTA/Home Rx: cyclobenzaprine 10 mg daily, lexapro 20 mg daily, gabapentin 600 TID, zyprexa 10 mg at bedtime, pantoprazole 40 mg daily, prazosin 1 mg at bedtime, trazodone 50 mg at bedtime,  Psych med management:  CST services from [facility].  Psychotherapy:  CST services from [place].  PCP: [facility]  Past Psychiatric History:  Previous Psych Diagnoses: Schizoaffective disorder bipolar type, Anxiety, Autism spectrum disorder, PTSD, Borderline personality disorder Prior inpatient treatment: multiple Current/prior outpatient treatment: [facility] Prior rehab hx: denies multiple Psychotherapy hx: [facility] History of suicide: 20-30 times by overdosing on pills, cutting or drinking 409 disinfectant History of homicide or aggression: denies Psychiatric medication history:  Previously trialed Tegretol, Haldol, Clozaril, Depakote, Invega, Lamictal, Seroquel, asenapine, abilify, vraylar, Lexapro, Zoloft  Neuromodulation history: denies   Past Medical/Surgical History:  Medical Diagnoses: Asthma, Seizure once in 2020, Fibromyalgia, Asperger's Syndrome, Acid Reflux  Home Rx: Protonix, Prazosin, Metoprolol, Vraylar, Lyrica,  Lexapro  Prior Hosp: Patient has had 6 inpatient visits in the last 5 months Prior Surgeries/Trauma: No surgeries  Head trauma, LOC, concussions, seizures: seizure in 2020 PCP: [facility] - Dr. Wynelle Link Allergies: Bee venom, Coconut flavor, Fish allergy, Geodon [ziprasidone hcl], Haloperidol and related, Lithobid [lithium], Roxicodone [oxycodone], Seroquel [quetiapine], Shellfish allergy, Phenergan [promethazine hcl], Prilosec [omeprazole], Sulfa antibiotics, Tegretol [carbamazepine], Prozac [fluoxetine], Tape, and Tylenol [acetaminophen]    Family Psychiatric History:  Medical: denies  Psych: Father has PTSD, Mother and Brother has Autism, Brother has depression  Psych Rx: SA/HA: denies  Substance use family hx: denies    Social History:  Childhood (bring, raised, lives now, parents, siblings, schooling, education): Patient was born and raised in [place], [place], patient has 2 brothers, patient has 3 associate degrees.  Abuse: Sexual, Physical, Emotional, Verbal  Marital Status: widow Sexual orientation: heterosexual  Children:1 daughter  Employment: unemployed Peer Group: CST Housing: Patient rents an apartment with a friend Finances: Patient receives disability  Legal: denies Hotel manager: denies   Substance Use History: Alcohol: denies Tobacco: denies Illicit drugs: denies Rx drug abuse: denies Rehab hx: denies   Principal Problem: Schizoaffective disorder, depressive type (HCC) Discharge Diagnoses: Principal Problem:   Schizoaffective disorder, depressive type (HCC) Active Problems:   Borderline personality disorder (HCC)   Asperger syndrome   PTSD (post-traumatic stress disorder)   GAD (generalized anxiety disorder)   Self-injurious behavior   Fibromyalgia  Past Psychiatric History: Per above Past Medical History:  Past Medical History:  Diagnosis Date   Acid reflux    Adjustment disorder with mixed anxiety and depressed mood 01/31/2022   Adjustment  disorder with mixed disturbance of emotions and conduct 08/03/2019   Anxiety    Asthma    last attack 03/13/15 or 03/14/15   Autism    Bipolar 1 disorder, depressed, severe (HCC) 07/25/2021   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    Chronic constipation    Depression    Drug-seeking behavior    Essential tremor    Headache    Ineffective individual coping 05/16/2022   Insomnia 01/12/2022   Intentional drug overdose (HCC) 06/05/2022   Neuromuscular disorder (HCC)    Normocytic anemia 06/05/2022   Overdose 07/22/2017   Overdose of acetaminophen 07/2017   and other meds   Overdose, intentional self-harm, initial encounter (HCC) 07/20/2021   Paranoia (HCC) 04/22/2021   Personality disorder (HCC)    Purposeful non-suicidal drug ingestion (HCC) 06/27/2021   Schizo-affective psychosis (HCC)    Schizoaffective disorder (HCC) 07/29/2022   Schizoaffective disorder, bipolar type (HCC)    Seizures (HCC)    Last seizure December 2017   Skin erythema 04/27/2022   Sleep apnea    Suicidal behavior 07/25/2021   Suicidal ideation    Suicide (HCC) 07/01/2021   Suicide attempt (HCC) 07/04/2021    Past Surgical History:  Procedure Laterality Date   MOUTH SURGERY  2009 or 2010   Family History:  Family History  Problem Relation Age of Onset   Mental illness Father    Asthma Father    PDD Brother    Seizures Brother    Family Psychiatric  History: Per above Social History:  Social History   Substance and Sexual Activity  Alcohol Use No   Alcohol/week: 1.0 standard drink of alcohol   Types: 1 Standard drinks or equivalent per week   Comment: denies at this time    Social History   Substance and Sexual Activity  Drug Use No   Comment: History of cocaine use at age 32 for 4 months    Social History   Socioeconomic History   Marital status: Widowed    Spouse name: Not on file   Number of children: 0   Years of education: Not on file   Highest education level: Not on file   Occupational History   Occupation: disability  Tobacco Use   Smoking status: Former    Packs/day: 0    Types: Cigarettes   Smokeless tobacco: Never   Tobacco comments:    Smoked for 2  years age [age]  Vaping Use   Vaping Use: Never used  Substance and Sexual Activity   Alcohol use: No    Alcohol/week: 1.0 standard drink of alcohol    Types: 1 Standard drinks or equivalent per week    Comment: denies at this time   Drug use: No    Comment: History of cocaine use at age 89 for 4 months   Sexual activity: Not Currently    Birth control/protection: None  Other Topics Concern   Not on file  Social History Narrative   Marital status: Widowed      Children: daughter      Lives: with boyfriend, in two story home      Employment:  Disability      Tobacco: quit smoking; smoked for two years.      Alcohol ;none      Drugs: none   Has not traveled outside of the country.   Right handed         Patient with hx of Fibromyalgia,Orthostatic Tachycardia,Asthma,Arthritis,Gerd, ASD   Social Determinants of Health   Financial Resource Strain: Not on file  Food Insecurity: No Food Insecurity (07/29/2022)   Hunger Vital Sign    Worried About Running Out of Food in the Last Year: Never true    Ran Out of Food  in the Last Year: Never true  Transportation Needs: No Transportation Needs (07/29/2022)   PRAPARE - Administrator, Civil Service (Medical): No    Lack of Transportation (Non-Medical): No  Recent Concern: Transportation Needs - Unmet Transportation Needs (05/06/2022)   PRAPARE - Administrator, Civil Service (Medical): Yes    Lack of Transportation (Non-Medical): Yes  Physical Activity: Not on file  Stress: Not on file  Social Connections: Not on file    Hospital Course:   During the patient's hospitalization, patient had extensive initial psychiatric evaluation, and follow-up psychiatric evaluations every day.  Psychiatric diagnoses provided upon  initial assessment:    Schizoaffective disorder, depressive type (HCC) Active Problems:   Borderline personality disorder (HCC)   Asperger syndrome   PTSD (post-traumatic stress disorder)   GAD (generalized anxiety disorder)   Self-injurious behavior   Fibromyalgia  Patient's psychiatric medications were adjusted on admission:  Continued home lexapro 20 mg daily Continued home zyprexa 10 mg qHS  Continued home gabapentin 600 mg TID Continued home flexeril 10 mg qHS Continued home protoniz EC 40 mg daily   During the hospitalization, other adjustments were made to the patient's psychiatric medication regimen:  Continued home lexapro 20 mg daily Continued home zyprexa 10 mg qHS  STARTED MV+Fe Continued home gabapentin 600 mg TID Continued home flexeril 10 mg qHS Continued home protoniz EC 40 mg daily   During the hospitalization lab/imaging obtained: BMI:37.81  QTc: 479 CMP WNL CBC showed WBC 10.8, Hb 11, MCV 79.4, otherwise WNL Tylenol level <10  Salicylate level <7  UDS + negative BAL <10 A1c 5.2 (04/20/2022)  Lipid Panel: Trig 163, LDL 106, otherwise WNL (04/20/2022) TSH 1.353 (05/13/2022)  Upreg negative  Patient's care was discussed during the interdisciplinary team meeting every day during the hospitalization.  Patient's side effects to prescribed psychiatric medications: NA  Assessment  Gradually, patient started adjusting to milieu. The patient was evaluated each day by a clinical provider to ascertain response to treatment. Improvement was noted by the patient's report of decreasing symptoms, improved sleep and appetite, affect, medication tolerance, behavior, and participation in unit programming.  Patient was asked each day to complete a self inventory noting mood, mental status, pain, new symptoms, anxiety and concerns.    Symptoms were reported as significantly decreased or resolved completely by discharge.   On day of discharge, the patient reports that their  mood is stable. The patient denied having suicidal thoughts for more than 48 hours prior to discharge.  Patient denies having homicidal thoughts.  Patient denies having auditory hallucinations.  Patient denies any visual hallucinations or other symptoms of psychosis. The patient was motivated to continue taking medication with a goal of continued improvement in mental health.   The patient reports their target psychiatric symptoms of suicidal ideation responded well to the psychiatric medications, and the patient reports overall benefit other psychiatric hospitalization. Supportive psychotherapy was provided to the patient. The patient also participated in regular group therapy while hospitalized. Coping skills, problem solving as well as relaxation therapies were also part of the unit programming.  Labs were reviewed with the patient, and abnormal results were discussed with the patient.  The patient is able to verbalize their individual safety plan to this provider.  Behavioral Events: NA  Restraints: NA  Groups: Engaged, appropriate  While future psychiatric events cannot be accurately predicted, the patient does not currently require acute inpatient psychiatric care and does not currently meet [facility] involuntary  commitment criteria.  # It is recommended to the patient to continue psychiatric medications as prescribed, after discharge from the hospital.    # It is recommended to the patient to follow up with your outpatient psychiatric provider and PCP.  # It was discussed with the patient, the impact of alcohol, drugs, tobacco have been there overall psychiatric and medical wellbeing, and total abstinence from substance use was recommended to the patient.  # Prescriptions provided or sent directly to preferred pharmacy at discharge. Patient agreeable to plan. Given opportunity to ask questions. Appears to feel comfortable with discharge.    # In the event of worsening symptoms,  the patient is instructed to call the crisis hotline, 911 and or go to the nearest ED for appropriate evaluation and treatment of symptoms. To follow-up with primary care provider for other medical issues, concerns and or health care needs  # Patient was discharged home with family with a plan to follow up as noted below.  Is patient on multiple antipsychotic therapies at discharge:  No   Has Patient had three or more failed trials of antipsychotic monotherapy by history:  No Recommended Plan for Multiple Antipsychotic Therapies: NA Tobacco Cessation:  N/A, patient does not currently use tobacco products  Physical Findings: BP 137/69 (BP Location: Left Arm)   Pulse (!) 109   Temp 97.7 F (36.5 C) (Oral)   Resp 18   Ht 5\' 4"  (1.626 m)   Wt 100 kg   LMP 07/22/2022 (Approximate)   SpO2 99%   BMI 37.83 kg/m   AIMS:  , ,  ,  ,     Physical Exam Vitals and nursing note reviewed.  Constitutional:      General: She is awake. She is not in acute distress.    Appearance: She is not ill-appearing or diaphoretic.  HENT:     Head: Normocephalic.  Pulmonary:     Effort: Pulmonary effort is normal. No respiratory distress.  Neurological:     Mental Status: She is alert.    Review of Systems  Respiratory:  Negative for shortness of breath.   Cardiovascular:  Negative for chest pain.  Gastrointestinal:  Positive for constipation. Negative for abdominal pain, diarrhea, nausea and vomiting.  Neurological:  Negative for dizziness and headaches.    Musculoskeletal: Strength & Muscle Tone: within normal limits Gait & Station: normal Patient leans: N/A   Presentation  General Appearance:Appropriate for Environment, Casual, Fairly Groomed Eye Contact:Good Speech:Clear and Coherent, Normal Rate Volume:Normal Handedness:Right  Mood and Affect  Mood:Euthymic Affect:Appropriate, Congruent, Constricted  Thought Process  Thought Process:Coherent, Goal Directed, Linear Descriptions of  Associations:Intact  Thought Content Suicidal Thoughts:Suicidal Thoughts: No (last time was 6/24, passive) Homicidal Thoughts:Homicidal Thoughts: No Hallucinations:Hallucinations: None (last time 6/25 AM, saying to throw trashcan which she did not) Ideas of Reference:None Thought Content:Perseveration  Sensorium  Memory:Immediate Good Judgment:Impaired Insight:Shallow  Executive Functions  Orientation:Full (Time, Place and Person) Language:Good Concentration:Good Attention:Good Recall:Good Fund of Knowledge:Good  Psychomotor Activity  Psychomotor Activity:Psychomotor Activity: Increased  Assets  Assets:Communication Skills, Desire for Improvement, Leisure Time, Housing  Sleep  Quality:Good  Social History   Tobacco Use  Smoking Status Former   Packs/day: 0   Types: Cigarettes  Smokeless Tobacco Never  Tobacco Comments   Smoked for 2  years age [age]    Blood Alcohol level:  Lab Results  Component Value Date   ETH <10 07/29/2022   ETH <10 07/13/2022    Metabolic Disorder Labs:  Lab Results  Component Value Date   HGBA1C 5.2 04/20/2022   MPG 103 04/20/2022   MPG 96.8 10/07/2021   Lab Results  Component Value Date   PROLACTIN 66.2 (H) 03/02/2021   PROLACTIN 66.1 (H) 02/04/2020   Lab Results  Component Value Date   CHOL 191 04/20/2022   TRIG 163 (H) 04/20/2022   HDL 52 04/20/2022   CHOLHDL 3.7 04/20/2022   VLDL 33 04/20/2022   LDLCALC 106 (H) 04/20/2022   LDLCALC 102 (H) 12/10/2021    Discharge destination: See above  Discharge Instructions     Activity as tolerated - No restrictions   Complete by: As directed    Diet general   Complete by: As directed       Allergies as of 08/02/2022       Reactions   Bee Venom Anaphylaxis   Coconut Flavor Anaphylaxis, Rash   Fish Allergy Anaphylaxis   Geodon [ziprasidone Hcl] Other (See Comments)   Pt states that this medication causes paralysis of the mouth.     Haloperidol And Related Other  (See Comments)   Pt states that this medication causes paralysis of the mouth, jaw locks up   Lithobid [lithium] Other (See Comments)   Seizure-like activity   Roxicodone [oxycodone] Other (See Comments)   Hallucinations    Seroquel [quetiapine] Other (See Comments)   Severe drowsiness Pt currently taking 12.5 mg BID, she is ok with this dose   Shellfish Allergy Anaphylaxis   Phenergan [promethazine Hcl] Other (See Comments)   Chest pain    Prilosec [omeprazole] Nausea And Vomiting   Pt can take protonix with no problems   Sulfa Antibiotics Other (See Comments)   Chest pain    Tegretol [carbamazepine] Nausea And Vomiting   Prozac [fluoxetine] Other (See Comments)   Increased Depression Suicidal thoughts   Tape Other (See Comments)   Skin tears, can only tolerate paper tape.   Tylenol [acetaminophen] Rash, Other (See Comments)   Rash on face         Medication List     STOP taking these medications    benztropine 1 MG tablet Commonly known as: COGENTIN       TAKE these medications      Indication  albuterol 108 (90 Base) MCG/ACT inhaler Commonly known as: VENTOLIN HFA Inhale 2 puffs into the lungs every 6 (six) hours as needed for wheezing or shortness of breath.  Indication: Asthma   Breyna 160-4.5 MCG/ACT inhaler Generic drug: budesonide-formoterol Inhale 1 puff into the lungs in the morning and at bedtime.  Indication: Asthma   cyclobenzaprine 10 MG tablet Commonly known as: FLEXERIL Take 10 mg by mouth at bedtime.  Indication: Fibromyalgia Syndrome   diclofenac Sodium 1 % Gel Commonly known as: VOLTAREN Apply 2 g topically 2 (two) times daily.  Indication: Joint Damage causing Pain and Loss of Function   escitalopram 20 MG tablet Commonly known as: LEXAPRO Take 1 tablet (20 mg total) by mouth daily.  Indication: Generalized Anxiety Disorder   gabapentin 600 MG tablet Commonly known as: NEURONTIN Take 600 mg by mouth 3 (three) times daily.   Indication: Generalized Anxiety Disorder   hydrOXYzine 25 MG tablet Commonly known as: ATARAX Take 1 tablet (25 mg total) by mouth 3 (three) times daily as needed for anxiety.  Indication: Feeling Anxious   melatonin 3 MG Tabs tablet Take 1 tablet (3 mg total) by mouth at bedtime.  Indication: Trouble Sleeping   OLANZapine zydis 10 MG disintegrating tablet Commonly known  as: ZYPREXA Take 1 tablet (10 mg total) by mouth at bedtime.  Indication: Manic Phase of Manic-Depression   pantoprazole 40 MG tablet Commonly known as: PROTONIX Take 40 mg by mouth daily.  Indication: Heartburn   prazosin 1 MG capsule Commonly known as: MINIPRESS Take 1 mg by mouth at bedtime.  Indication: Frightening Dreams        Follow-up Information     [facility]. Call.   Why: Please continue with this provider for CST services.  If you have any questions, please call Lupita Leash at [phone] Contact information: [address]  [address] [place] [address] [phone]                 Discharge recommendations:   # It is recommended to the patient to continue psychiatric medications as prescribed, after discharge from the hospital.     # It is recommended to the patient to follow up with your outpatient psychiatric provider -instructions on appointment date, time, and address (location) are provided to you in discharge paperwork  # Follow-up with outpatient primary care doctor and other specialists -for management of chronic medical disease, including:  GERD, tachycardia, pain  # Testing: Follow-up with outpatient provider for abnormal lab results:  See above   # It was discussed with the patient, the impact of alcohol, drugs, tobacco have been there overall psychiatric and medical wellbeing, and total abstinence from substance use was recommended to the patient.   # Prescriptions provided or sent directly to preferred pharmacy at discharge. Patient agreeable to plan. Given  opportunity to ask questions. Appears to feel comfortable with discharge.    # In the event of worsening symptoms, the patient is instructed to call the crisis hotline, 911 and or go to the nearest ED for appropriate evaluation and treatment of symptoms. To follow-up with primary care provider for other medical issues, concerns and or health care needs  Total Time Spent in Direct Patient Care: See attending attestation.  Signed: Princess Bruins, DO Psychiatry Resident, PGY-2 [facility]  [address] [place], [place] [address] Ph: [phone] Fax: [phone]

## 2022-08-04 ENCOUNTER — Other Ambulatory Visit: Payer: Self-pay

## 2022-08-04 ENCOUNTER — Emergency Department (HOSPITAL_COMMUNITY): Payer: Medicare PPO

## 2022-08-04 ENCOUNTER — Emergency Department (HOSPITAL_COMMUNITY)
Admission: EM | Admit: 2022-08-04 | Discharge: 2022-08-05 | Disposition: A | Discharge status: Home / Self Care | Payer: Medicare PPO | Attending: Emergency Medicine | Admitting: Emergency Medicine

## 2022-08-04 ENCOUNTER — Encounter (HOSPITAL_COMMUNITY): Payer: Self-pay | Admitting: Emergency Medicine

## 2022-08-04 DIAGNOSIS — R000 Tachycardia, unspecified: Secondary | ICD-10-CM | POA: Diagnosis present

## 2022-08-04 DIAGNOSIS — Z79899 Other long term (current) drug therapy: Secondary | ICD-10-CM | POA: Diagnosis not present

## 2022-08-04 DIAGNOSIS — J45909 Unspecified asthma, uncomplicated: Secondary | ICD-10-CM | POA: Diagnosis not present

## 2022-08-04 DIAGNOSIS — R0600 Dyspnea, unspecified: Secondary | ICD-10-CM | POA: Diagnosis not present

## 2022-08-04 DIAGNOSIS — D649 Anemia, unspecified: Secondary | ICD-10-CM | POA: Insufficient documentation

## 2022-08-04 DIAGNOSIS — Z7951 Long term (current) use of inhaled steroids: Secondary | ICD-10-CM | POA: Insufficient documentation

## 2022-08-04 DIAGNOSIS — F840 Autistic disorder: Secondary | ICD-10-CM | POA: Diagnosis not present

## 2022-08-04 LAB — CBC
HCT: 37.6 % (ref 36.0–46.0)
Hemoglobin: 11.2 g/dL — ABNORMAL LOW (ref 12.0–15.0)
MCH: 23.9 pg — ABNORMAL LOW (ref 26.0–34.0)
MCHC: 29.8 g/dL — ABNORMAL LOW (ref 30.0–36.0)
MCV: 80.3 fL (ref 80.0–100.0)
Platelets: 329 10*3/uL (ref 150–400)
RBC: 4.68 MIL/uL (ref 3.87–5.11)
RDW: 15.1 % (ref 11.5–15.5)
WBC: 10.2 10*3/uL (ref 4.0–10.5)
nRBC: 0 % (ref 0.0–0.2)

## 2022-08-04 LAB — BASIC METABOLIC PANEL
Anion gap: 16 — ABNORMAL HIGH (ref 5–15)
BUN: 10 mg/dL (ref 6–20)
CO2: 19 mmol/L — ABNORMAL LOW (ref 22–32)
Calcium: 9.3 mg/dL (ref 8.9–10.3)
Chloride: 108 mmol/L (ref 98–111)
Creatinine, Ser: 0.74 mg/dL (ref 0.44–1.00)
GFR, Estimated: >60 mL/min (ref >60)
Glucose, Bld: 145 mg/dL — ABNORMAL HIGH (ref 70–99)
Potassium: 4.1 mmol/L (ref 3.5–5.1)
Sodium: 143 mmol/L (ref 135–145)

## 2022-08-04 LAB — HCG, SERUM, QUALITATIVE: Preg, Serum: NEGATIVE

## 2022-08-04 MED ORDER — SODIUM CHLORIDE 0.9 % IV SOLN
1000.0000 mL | INTRAVENOUS | Status: DC
  Administered 2022-08-05: 1000 mL via INTRAVENOUS

## 2022-08-04 MED ORDER — SODIUM CHLORIDE 0.9 % IV BOLUS (SEPSIS)
1000.0000 mL | Freq: Once | INTRAVENOUS | Status: AC
  Administered 2022-08-05: 1000 mL via INTRAVENOUS

## 2022-08-04 NOTE — ED Notes (Signed)
The pt reports that she has had a rapid heart rate for 2 weeks  she was supposed to see a doctor for the same but she missed her appointment  no pain pt in svt [facility] regular

## 2022-08-04 NOTE — ED Triage Notes (Signed)
Pt bib EMS for tachycardia. Pt denies chest pain. States she was on a medication for her tachycardia but ran out and was never put back on it. Pt states she gets SOB when HR goes up.

## 2022-08-04 NOTE — ED Provider Notes (Signed)
[place] [facility] AT [facility] Provider Note  CSN: [id number] Arrival date & time: 08/04/22 2115  Chief Complaint(s) Tachycardia  HPI [name] is a 32 y.o. 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 fragile X syndrome    Chronic constipation    Depression    Drug-seeking behavior    Essential tremor    Headache    Ineffective individual coping 05/16/2022   Insomnia 01/12/2022   Intentional drug overdose (HCC) 06/05/2022   Neuromuscular disorder (HCC)    Normocytic anemia 06/05/2022   Overdose 07/22/2017   Overdose of acetaminophen 07/2017   and other meds   Overdose, intentional self-harm, initial encounter (HCC) 07/20/2021   Paranoia (HCC) 04/22/2021   Personality disorder (HCC)    Purposeful non-suicidal drug ingestion (HCC) 06/27/2021   Schizo-affective psychosis (HCC)    Schizoaffective disorder (HCC) 07/29/2022   Schizoaffective disorder, bipolar type (HCC)    Seizures (HCC)    Last seizure December 2017   Skin erythema 04/27/2022   Sleep apnea    Suicidal behavior 07/25/2021   Suicidal ideation    Suicide (HCC) 07/01/2021   Suicide attempt (HCC) 07/04/2021   Patient Active Problem List   Diagnosis Date Noted   Schizoaffective disorder, depressive type (HCC) 07/06/2022   Passive suicidal ideations 04/14/2022   Malingering 02/22/2022   Pain in joint, ankle and foot 01/12/2022   Suicide attempt by cutting of wrist (HCC) 10/11/2021   Fibromyalgia 10/07/2021   Self-injurious behavior 07/19/2021   Bipolar I disorder,  current or most recent episode depressed, with psychotic features (HCC) 07/05/2021   GAD (generalized anxiety disorder) 04/22/2021   Intentional acetaminophen overdose (HCC)    DUB (dysfunctional uterine bleeding) 11/22/2016   Hyperprolactinemia (HCC) 08/20/2016   Seizure disorder (HCC) 08/08/2015   Migraines 07/27/2015   Asthma 04/15/2015   Schizoaffective disorder, bipolar type (HCC) 03/10/2014   PTSD (post-traumatic stress disorder) 03/10/2014   Borderline personality disorder (HCC) 10/31/2013   Asperger syndrome 06/15/2013   Home Medication(s) Prior to Admission medications   Medication Sig Start Date End Date Taking? Authorizing Provider  albuterol (VENTOLIN HFA) 108 (90 Base) MCG/ACT inhaler Inhale 2 puffs into the lungs every 6 (six) hours as needed for wheezing or shortness of breath.    [provider]  BREYNA 160-4.5 MCG/ACT inhaler Inhale 1 puff into the lungs in the morning and at bedtime. 04/18/22   [provider]  cyclobenzaprine (FLEXERIL) 10 MG tablet Take 10 mg by mouth at bedtime. 05/10/22   [provider]  diclofenac Sodium (VOLTAREN) 1 % GEL Apply 2 g topically 2 (two) times daily. 06/11/22   Starleen Blue, NP  escitalopram (LEXAPRO) 20 MG tablet Take 1 tablet (20 mg total) by mouth daily. 06/24/22   White, Patrice L, NP  gabapentin (NEURONTIN) 600 MG tablet Take 600 mg by mouth 3 (three) times daily. 06/29/22   [provider]  hydrOXYzine (ATARAX) 25 MG tablet Take 1 tablet (25 mg total) by mouth 3 (three) times daily as needed for anxiety. 06/11/22  Starleen Blue, NP  melatonin 3 MG TABS tablet Take 1 tablet (3 mg total) by mouth at bedtime. 08/02/22   Princess Bruins, DO  OLANZapine zydis (ZYPREXA) 10 MG disintegrating tablet Take 1 tablet (10 mg total) by mouth at bedtime. 06/23/22   White, Patrice L, NP  pantoprazole (PROTONIX) 40 MG tablet Take 40 mg by mouth daily.    [provider]  prazosin (MINIPRESS) 1 MG capsule Take 1  mg by mouth at bedtime.    [provider]                                                                                                                                    Allergies Bee venom, Coconut flavor, Fish allergy, Geodon [ziprasidone hcl], Haloperidol and related, Lithobid [lithium], Roxicodone [oxycodone], Seroquel [quetiapine], Shellfish allergy, Phenergan [promethazine hcl], Prilosec [omeprazole], Sulfa antibiotics, Tegretol [carbamazepine], Prozac [fluoxetine], Tape, and Tylenol [acetaminophen]  Review of Systems Review of Systems As noted in HPI  Physical Exam Vital Signs  I have reviewed the triage vital signs BP 129/88 (BP Location: Right Arm)   Pulse (!) 120   Temp 97.9 F (36.6 C) (Oral)   Resp 18   Wt 99.8 kg   LMP 07/22/2022 (Approximate)   SpO2 100%   BMI 37.76 kg/m  *** Physical Exam  ED Results and Treatments Labs (all labs ordered are listed, but only abnormal results are displayed) Labs Reviewed  BASIC METABOLIC PANEL - Abnormal; Notable for the following components:      Result Value   CO2 19 (*)    Glucose, Bld 145 (*)    Anion gap 16 (*)    All other components within normal limits  CBC - Abnormal; Notable for the following components:   Hemoglobin 11.2 (*)    MCH 23.9 (*)    MCHC 29.8 (*)    All other components within normal limits  HCG, SERUM, QUALITATIVE                                                                                                                         EKG  EKG Interpretation Date/Time:  Saturday August 04 2022 21:19:45 EDT Ventricular Rate:  125 PR Interval:  118 QRS Duration:  80 QT Interval:  320 QTC Calculation: 461 R Axis:   81  Text Interpretation: Sinus tachycardia rate related ST changes When compared with ECG of 31-Jul-2022  20:32, PREVIOUS ECG IS PRESENT Otherwise no significant change Confirmed by Drema Pry [phone]) on 08/04/2022 11:04:57 PM       Radiology DG Chest Port 1  View  Result Date: 08/04/2022 CLINICAL DATA:  History of atrial fibrillation initial encounter EXAM: PORTABLE CHEST 1 VIEW COMPARISON:  07/29/2022 FINDINGS: The heart size and mediastinal contours are within normal limits. Both lungs are clear. The visualized skeletal structures are unremarkable. IMPRESSION: No active disease. Electronically Signed   By: Alcide Clever M.D.   On: 08/04/2022 21:36    Medications Ordered in ED Medications - No data to display Procedures Procedures  (including critical care time) Medical Decision Making / ED Course   Medical Decision Making Amount and/or Complexity of Data Reviewed Labs: ordered. Radiology: ordered.    ***    Final Clinical Impression(s) / ED Diagnoses Final diagnoses:  None    This chart was dictated using voice recognition software.  Despite best efforts to proofread,  errors can occur which can change the documentation meaning.

## 2022-08-05 DIAGNOSIS — R000 Tachycardia, unspecified: Secondary | ICD-10-CM | POA: Diagnosis not present

## 2022-08-05 LAB — RAPID URINE DRUG SCREEN, HOSP PERFORMED
Amphetamines: NOT DETECTED
Barbiturates: NOT DETECTED
Benzodiazepines: NOT DETECTED
Cocaine: NOT DETECTED
Opiates: NOT DETECTED
Tetrahydrocannabinol: NOT DETECTED

## 2022-08-05 LAB — HEPATIC FUNCTION PANEL
ALT: 25 U/L (ref 0–44)
AST: 23 U/L (ref 15–41)
Albumin: 3.4 g/dL — ABNORMAL LOW (ref 3.5–5.0)
Alkaline Phosphatase: 65 U/L (ref 38–126)
Bilirubin, Direct: <0.1 mg/dL (ref 0.0–0.2)
Total Bilirubin: 0.4 mg/dL (ref 0.3–1.2)
Total Protein: 6.7 g/dL (ref 6.5–8.1)

## 2022-08-05 LAB — D-DIMER, QUANTITATIVE: D-Dimer, Quant: <0.27 ug/mL-FEU (ref 0.00–0.50)

## 2022-08-05 MED ORDER — METOPROLOL TARTRATE 25 MG PO TABS
12.5000 mg | ORAL_TABLET | Freq: Once | ORAL | Status: AC
  Administered 2022-08-05: 12.5 mg via ORAL
  Filled 2022-08-05: qty 1

## 2022-08-05 MED ORDER — METOPROLOL TARTRATE 25 MG PO TABS: 12.5000 mg | ORAL_TABLET | Freq: Two times a day (BID) | ORAL | 0 refills | Status: DC

## 2022-08-05 MED ORDER — SODIUM CHLORIDE 0.9 % IV BOLUS
1000.0000 mL | Freq: Once | INTRAVENOUS | Status: AC
  Administered 2022-08-05: 1000 mL via INTRAVENOUS

## 2022-08-05 NOTE — ED Notes (Addendum)
Pt ambulated to watch HR per orders, Pt HR up to 132 while ambulating. Dr. Eudelia Bunch updated. Pt back in bed, no acute distress noted. Call bell in reach.

## 2022-08-05 NOTE — ED Notes (Signed)
Pt ambulate with NT heart rate was 117.

## 2022-08-08 ENCOUNTER — Emergency Department (HOSPITAL_COMMUNITY)
Admission: EM | Admit: 2022-08-08 | Discharge: 2022-08-12 | Disposition: A | Discharge status: Other Acute Inpatient Hospital | Payer: Medicare PPO | Attending: Emergency Medicine | Admitting: Emergency Medicine

## 2022-08-08 DIAGNOSIS — F845 Asperger's syndrome: Secondary | ICD-10-CM | POA: Insufficient documentation

## 2022-08-08 DIAGNOSIS — T446X2A Poisoning by alpha-adrenoreceptor antagonists, intentional self-harm, initial encounter: Secondary | ICD-10-CM | POA: Diagnosis not present

## 2022-08-08 DIAGNOSIS — T471X2A Poisoning by other antacids and anti-gastric-secretion drugs, intentional self-harm, initial encounter: Secondary | ICD-10-CM | POA: Insufficient documentation

## 2022-08-08 DIAGNOSIS — X838XXA Intentional self-harm by other specified means, initial encounter: Secondary | ICD-10-CM | POA: Diagnosis not present

## 2022-08-08 DIAGNOSIS — T1491XA Suicide attempt, initial encounter: Secondary | ICD-10-CM | POA: Insufficient documentation

## 2022-08-08 DIAGNOSIS — F251 Schizoaffective disorder, depressive type: Secondary | ICD-10-CM | POA: Diagnosis not present

## 2022-08-08 DIAGNOSIS — T426X2A Poisoning by other antiepileptic and sedative-hypnotic drugs, intentional self-harm, initial encounter: Secondary | ICD-10-CM | POA: Insufficient documentation

## 2022-08-08 DIAGNOSIS — Z87891 Personal history of nicotine dependence: Secondary | ICD-10-CM | POA: Insufficient documentation

## 2022-08-08 DIAGNOSIS — T43592A Poisoning by other antipsychotics and neuroleptics, intentional self-harm, initial encounter: Secondary | ICD-10-CM | POA: Diagnosis not present

## 2022-08-08 DIAGNOSIS — T50902A Poisoning by unspecified drugs, medicaments and biological substances, intentional self-harm, initial encounter: Secondary | ICD-10-CM | POA: Diagnosis present

## 2022-08-08 DIAGNOSIS — T43222A Poisoning by selective serotonin reuptake inhibitors, intentional self-harm, initial encounter: Secondary | ICD-10-CM | POA: Diagnosis not present

## 2022-08-08 NOTE — ED Triage Notes (Signed)
Pt arrived via EMS for Suicide attempt, EMS did call poison control prior to arrival. Pt reports taking home meds; 30 Neurontin 10 lexapro 20 10 zyprexa  10 pantrprazole 40ng 10 prozosine 1mg  10 flexaril 10mg  7 lopressor 25mg  Pt stated she took all those meds because she is "overwhelmed with rent and job issues" poison control stated expect drowsiness. Lexapro can cause prolong qtc seizure, hypertension, prazosin can cause bradycardia, hypotension, delay qtc prolonged, qrs prolong, zypexa hypotension, n/v/d,

## 2022-08-09 ENCOUNTER — Other Ambulatory Visit: Payer: Self-pay

## 2022-08-09 ENCOUNTER — Emergency Department (HOSPITAL_COMMUNITY): Payer: Medicare PPO

## 2022-08-09 DIAGNOSIS — F251 Schizoaffective disorder, depressive type: Secondary | ICD-10-CM | POA: Diagnosis not present

## 2022-08-09 LAB — BLOOD GAS, VENOUS
Acid-Base Excess: 3.6 mmol/L — ABNORMAL HIGH (ref 0.0–2.0)
Bicarbonate: 30.1 mmol/L — ABNORMAL HIGH (ref 20.0–28.0)
O2 Saturation: 38.3 %
Patient temperature: 37
pCO2, Ven: 52 mmHg (ref 44–60)
pH, Ven: 7.37 (ref 7.25–7.43)
pO2, Ven: <31 mmHg — CL (ref 32–45)

## 2022-08-09 LAB — COMPREHENSIVE METABOLIC PANEL
ALT: 22 U/L (ref 0–44)
AST: 20 U/L (ref 15–41)
Albumin: 3.5 g/dL (ref 3.5–5.0)
Alkaline Phosphatase: 67 U/L (ref 38–126)
Anion gap: 8 (ref 5–15)
BUN: 13 mg/dL (ref 6–20)
CO2: 23 mmol/L (ref 22–32)
Calcium: 9 mg/dL (ref 8.9–10.3)
Chloride: 105 mmol/L (ref 98–111)
Creatinine, Ser: 0.69 mg/dL (ref 0.44–1.00)
GFR, Estimated: >60 mL/min (ref >60)
Glucose, Bld: 109 mg/dL — ABNORMAL HIGH (ref 70–99)
Potassium: 4.1 mmol/L (ref 3.5–5.1)
Sodium: 136 mmol/L (ref 135–145)
Total Bilirubin: 0.3 mg/dL (ref 0.3–1.2)
Total Protein: 7 g/dL (ref 6.5–8.1)

## 2022-08-09 LAB — RAPID URINE DRUG SCREEN, HOSP PERFORMED
Amphetamines: NOT DETECTED
Barbiturates: NOT DETECTED
Benzodiazepines: NOT DETECTED
Cocaine: NOT DETECTED
Opiates: NOT DETECTED
Tetrahydrocannabinol: NOT DETECTED

## 2022-08-09 LAB — BASIC METABOLIC PANEL
Anion gap: 6 (ref 5–15)
BUN: 13 mg/dL (ref 6–20)
CO2: 24 mmol/L (ref 22–32)
Calcium: 9 mg/dL (ref 8.9–10.3)
Chloride: 105 mmol/L (ref 98–111)
Creatinine, Ser: 0.88 mg/dL (ref 0.44–1.00)
GFR, Estimated: >60 mL/min (ref >60)
Glucose, Bld: 110 mg/dL — ABNORMAL HIGH (ref 70–99)
Potassium: 4.2 mmol/L (ref 3.5–5.1)
Sodium: 135 mmol/L (ref 135–145)

## 2022-08-09 LAB — CBC WITH DIFFERENTIAL/PLATELET
Abs Immature Granulocytes: 0.03 10*3/uL (ref 0.00–0.07)
Basophils Absolute: 0.1 10*3/uL (ref 0.0–0.1)
Basophils Relative: 1 %
Eosinophils Absolute: 0.1 10*3/uL (ref 0.0–0.5)
Eosinophils Relative: 1 %
HCT: 32.4 % — ABNORMAL LOW (ref 36.0–46.0)
Hemoglobin: 10.1 g/dL — ABNORMAL LOW (ref 12.0–15.0)
Immature Granulocytes: 0 %
Lymphocytes Relative: 26 %
Lymphs Abs: 2.6 10*3/uL (ref 0.7–4.0)
MCH: 24.9 pg — ABNORMAL LOW (ref 26.0–34.0)
MCHC: 31.2 g/dL (ref 30.0–36.0)
MCV: 79.8 fL — ABNORMAL LOW (ref 80.0–100.0)
Monocytes Absolute: 0.6 10*3/uL (ref 0.1–1.0)
Monocytes Relative: 5 %
Neutro Abs: 6.9 10*3/uL (ref 1.7–7.7)
Neutrophils Relative %: 67 %
Platelets: 304 10*3/uL (ref 150–400)
RBC: 4.06 MIL/uL (ref 3.87–5.11)
RDW: 15.2 % (ref 11.5–15.5)
WBC: 10.2 10*3/uL (ref 4.0–10.5)
nRBC: 0 % (ref 0.0–0.2)

## 2022-08-09 LAB — ETHANOL: Alcohol, Ethyl (B): <10 mg/dL (ref <10)

## 2022-08-09 LAB — HCG, SERUM, QUALITATIVE: Preg, Serum: NEGATIVE

## 2022-08-09 LAB — CBG MONITORING, ED: Glucose-Capillary: 120 mg/dL — ABNORMAL HIGH (ref 70–99)

## 2022-08-09 LAB — AMMONIA: Ammonia: 13 umol/L (ref 9–35)

## 2022-08-09 LAB — MAGNESIUM: Magnesium: 1.8 mg/dL (ref 1.7–2.4)

## 2022-08-09 LAB — ACETAMINOPHEN LEVEL
Acetaminophen (Tylenol), Serum: <10 ug/mL — ABNORMAL LOW (ref 10–30)
Acetaminophen (Tylenol), Serum: <10 ug/mL — ABNORMAL LOW (ref 10–30)

## 2022-08-09 LAB — SALICYLATE LEVEL: Salicylate Lvl: <7 mg/dL — ABNORMAL LOW (ref 7.0–30.0)

## 2022-08-09 MED ORDER — NALOXONE HCL 2 MG/2ML IJ SOSY
1.0000 mg | PREFILLED_SYRINGE | Freq: Once | INTRAMUSCULAR | Status: DC
  Filled 2022-08-09 (×2): qty 2

## 2022-08-09 MED ORDER — LACTATED RINGERS IV SOLN: INTRAVENOUS | Status: DC

## 2022-08-09 MED ORDER — LACTATED RINGERS IV BOLUS
1000.0000 mL | Freq: Once | INTRAVENOUS | Status: AC
  Administered 2022-08-09: 1000 mL via INTRAVENOUS

## 2022-08-09 MED ORDER — OLANZAPINE 10 MG PO TBDP
10.0000 mg | ORAL_TABLET | Freq: Every day | ORAL | Status: DC
  Administered 2022-08-09 – 2022-08-11 (×3): 10 mg via ORAL
  Filled 2022-08-09 (×3): qty 1

## 2022-08-09 MED ORDER — ALBUTEROL SULFATE HFA 108 (90 BASE) MCG/ACT IN AERS: 2.0000 | INHALATION_SPRAY | Freq: Four times a day (QID) | RESPIRATORY_TRACT | Status: DC | PRN

## 2022-08-09 MED ORDER — MOMETASONE FURO-FORMOTEROL FUM 200-5 MCG/ACT IN AERO
2.0000 | INHALATION_SPRAY | Freq: Two times a day (BID) | RESPIRATORY_TRACT | Status: DC
  Administered 2022-08-10 – 2022-08-11 (×3): 2 via RESPIRATORY_TRACT
  Filled 2022-08-09: qty 8.8

## 2022-08-09 MED ORDER — PANTOPRAZOLE SODIUM 40 MG PO TBEC
40.0000 mg | DELAYED_RELEASE_TABLET | Freq: Every day | ORAL | Status: DC
  Administered 2022-08-10 – 2022-08-11 (×2): 40 mg via ORAL
  Filled 2022-08-09 (×2): qty 1

## 2022-08-09 NOTE — Consult Note (Signed)
[facility] ED ASSESSMENT   Reason for Consult: Psych Consult Referring Physician: Dr. Adela Lank Patient Identification: [name] MRN:  [id number] ED Chief Complaint: Schizoaffective disorder, depressive type [facility])  Diagnosis:  Principal Problem:   Schizoaffective disorder, depressive type (HCC) Active Problems:   Asperger syndrome   Intentional overdose [facility])   ED Assessment Time Calculation: Start Time: 1650 Stop Time: 1720 Total Time in Minutes (Assessment Completion): 30   Subjective: [name] is a 32 y.o. female with PMH of  schizoaffective-bipolar type, GAD, PTSD, carrier of fragile x syndrome, ASD v asperger syndrome, borderline personality, NSSIB (cutting), fibromyalgia, multiple suicide attempt, multiple inpt psych admission, who presented voluntary to [facility] (07/29/2022) via [facility], then transferred Voluntary to [facility] (07/29/2022) for suicidal ideation   "Patient comes to [facility] with chief complaint of reported suicide attempts.  The patient reportedly took 30 Neurontin 10 Lexapro's 10 Zyprexa Zydis 10 Protonix 10 prazosin 10 Flexeril's and 7 Lopressor's.  Says she took it to end her life.  She then took the time to pack a suitcase and decided come to the ER to be evaluated".    HPI: [name], 32 y.o., female patient seen face to face by this provider, consulted with Dr. Lucianne Muss; and chart reviewed on 08/09/22.  On evaluation [name] reports that she has been having issues at home and overwhelmed with rent and job.  Patient states that her landlord is going up on her rent from $1050 per month to $1300 per month.  She states she has a possibility to get a job at [employer] $17 an hour, but when she added up she says that her other bills will increase.  She states her other bills will increase because her Medicaid will be cut off, then she will have to pay for her own health insurance, and she states [place] who helps with her rent by  paying 75% will cut her off and her community support team will cut her off, because she will be making too much by working at [employer].  She states that when she started thinking about this she became depressed and anxious she rates her depression 8/10 with 10 being severe, and rates her anxiety 5/10 with 10 being severe, she states she also feels hopeless.  She continues to say "I just do not know what to do, what should I do."Provider suggested that she go and talk to landlord, see when he rent would increase and possibly talk to [facility] and community support team to see what other resources they have and how else they could do to assist.  Patient keeps saying "I feel stuck." Patient states that her appetite and sleep are good.  States she had a visit with her daughter about a month ago and the visit went well.  She says she has been compliant with medications and denies using any illicit drugs or alcohol.  UDS was negative and BAL is less than 10. She is unable to contract for safety.  But then asked provider, "do you think I will be able to get out of here by Monday, so I can start the process to get the job at [employer]."  During evaluation [name] is sitting up in bed, in no acute distress; she is alert/oriented x 4; calm/cooperative; and mood congruent with affect.  Patient speech is fast and slurred, provider has to ask her to repeat statements; she has good eye contact.  Her thought process is coherent  and relevant; There is no indication that she is currently responding to internal/external stimuli or experiencing delusional thought content.  Patient denies homicidal ideations, psychosis, and paranoia.  Patient states that this admission was a suicidal attempt and currently endorses suicidal ideations, due to her stressors of recent increase in starting a new job.  Patient has remained calm throughout assessment and has answered questions appropriately.  Despite previous safety  plans, mitigation of suicide risks, and treatment adherence; she continues to remain a danger to herself at this time.   Past Psychiatric History: schizoaffective-bipolar type, GAD, PTSD, carrier of fragile x syndrome, ASD v asperger syndrome, borderline personality, NSSIB (cutting), fibromyalgia, multiple suicide attempt, multiple inpt psych admission  Risk to Self or Others: Risk to Self: Active SI Risk to Others:  No  Prior Inpatient Therapy:  Yes  Prior Outpatient Therapy: Yes   [place] Scale:  Flowsheet Row ED from 08/08/2022 in [facility] Emergency Department at [facility] ED from 08/04/2022 in [facility] Emergency Department at [facility] Admission (Discharged[facility]  C-SSRS RISK CATEGORY Error: Q3, 4, or 5 should not be populated when Q2 is No No Risk High Risk       AIMS:  , , ,  ,   ASAM:    Substance Abuse:     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    Chronic constipation    Depression    Drug-seeking behavior    Essential tremor    Headache    Ineffective individual coping 05/16/2022   Insomnia 01/12/2022   Intentional drug overdose (HCC) 06/05/2022   Neuromuscular disorder (HCC)    Normocytic anemia 06/05/2022   Overdose 07/22/2017   Overdose of acetaminophen 07/2017   and other meds   Overdose, intentional self-harm, initial encounter (HCC) 07/20/2021   Paranoia (HCC) 04/22/2021   Personality disorder (HCC)    Purposeful non-suicidal drug ingestion (HCC) 06/27/2021   Schizo-affective psychosis (HCC)    Schizoaffective disorder (HCC)  07/29/2022   Schizoaffective disorder, bipolar type (HCC)    Seizures (HCC)    Last seizure December 2017   Skin erythema 04/27/2022   Sleep apnea    Suicidal behavior 07/25/2021   Suicidal ideation    Suicide (HCC) 07/01/2021   Suicide attempt (HCC) 07/04/2021    Past Surgical History:  Procedure Laterality Date   MOUTH SURGERY  2009 or 2010   Family History:  Family History  Problem Relation Age of Onset   Mental illness Father    Asthma Father    PDD Brother    Seizures Brother     Social History:  Social History   Substance and Sexual Activity  Alcohol Use No   Alcohol/week: 1.0 standard drink of alcohol   Types: 1 Standard drinks or equivalent per week   Comment: denies at this time     Social History   Substance and Sexual Activity  Drug Use No   Comment: History of cocaine use at age 7 for 4 months    Social History   Socioeconomic History  Marital status: Widowed    Spouse name: Not on file   Number of children: 0   Years of education: Not on file   Highest education level: Not on file  Occupational History   Occupation: disability  Tobacco Use   Smoking status: Former    Packs/day: 0    Types: Cigarettes   Smokeless tobacco: Never   Tobacco comments:    Smoked for 2  years age [age]  Vaping Use   Vaping Use: Never used  Substance and Sexual Activity   Alcohol use: No    Alcohol/week: 1.0 standard drink of alcohol    Types: 1 Standard drinks or equivalent per week    Comment: denies at this time   Drug use: No    Comment: History of cocaine use at age 48 for 4 months   Sexual activity: Not Currently    Birth control/protection: None  Other Topics Concern   Not on file  Social History Narrative   Marital status: Widowed      Children: daughter      Lives: with boyfriend, in two story home      Employment:  Disability      Tobacco: quit smoking; smoked for two years.      Alcohol ;none      Drugs: none   Has not traveled outside of  the country.   Right handed         Patient with hx of Fibromyalgia,Orthostatic Tachycardia,Asthma,Arthritis,Gerd, ASD   Social Determinants of Health   Financial Resource Strain: Not on file  Food Insecurity: No Food Insecurity (07/29/2022)   Hunger Vital Sign    Worried About Running Out of Food in the Last Year: Never true    Ran Out of Food in the Last Year: Never true  Transportation Needs: No Transportation Needs (07/29/2022)   PRAPARE - Administrator, Civil Service (Medical): No    Lack of Transportation (Non-Medical): No  Recent Concern: Transportation Needs - Unmet Transportation Needs (05/06/2022)   PRAPARE - Administrator, Civil Service (Medical): Yes    Lack of Transportation (Non-Medical): Yes  Physical Activity: Not on file  Stress: Not on file  Social Connections: Not on file      Allergies:   Allergies  Allergen Reactions   Bee Venom Anaphylaxis   Coconut Flavor Anaphylaxis and Rash   Fish Allergy Anaphylaxis   Geodon [Ziprasidone Hcl] Other (See Comments)    Pt states that this medication causes paralysis of the mouth.     Haloperidol And Related Other (See Comments)    Pt states that this medication causes paralysis of the mouth, jaw locks up   Lithobid [Lithium] Other (See Comments)    Seizure-like activity    Roxicodone [Oxycodone] Other (See Comments)    Hallucinations    Seroquel [Quetiapine] Other (See Comments)    Severe drowsiness Pt currently taking 12.5 mg BID, she is ok with this dose   Shellfish Allergy Anaphylaxis   Phenergan [Promethazine Hcl] Other (See Comments)    Chest pain     Prilosec [Omeprazole] Nausea And Vomiting    Pt can take protonix with no problems    Sulfa Antibiotics Other (See Comments)    Chest pain    Tegretol [Carbamazepine] Nausea And Vomiting   Prozac [Fluoxetine] Other (See Comments)    Increased Depression Suicidal thoughts   Tape Other (See Comments)    Skin tears, can only tolerate  paper tape.  Tylenol [Acetaminophen] Rash and Other (See Comments)    Rash on face     Labs:  Results for orders placed or performed during the hospital encounter of 08/08/22 (from the past 48 hour(s))  Comprehensive metabolic panel     Status: Abnormal   Collection Time: 08/09/22 12:20 AM  Result Value Ref Range   Sodium 136 135 - 145 mmol/L   Potassium 4.1 3.5 - 5.1 mmol/L   Chloride 105 98 - 111 mmol/L   CO2 23 22 - 32 mmol/L   Glucose, Bld 109 (H) 70 - 99 mg/dL    Comment: Glucose reference range applies only to samples taken after fasting for at least 8 hours.   BUN 13 6 - 20 mg/dL   Creatinine, Ser 1.61 0.44 - 1.00 mg/dL   Calcium 9.0 8.9 - 09.6 mg/dL   Total Protein 7.0 6.5 - 8.1 g/dL   Albumin 3.5 3.5 - 5.0 g/dL   AST 20 15 - 41 U/L   ALT 22 0 - 44 U/L   Alkaline Phosphatase 67 38 - 126 U/L   Total Bilirubin 0.3 0.3 - 1.2 mg/dL   GFR, Estimated >04 >54 mL/min    Comment: (NOTE) Calculated using the CKD-EPI Creatinine Equation (2021)    Anion gap 8 5 - 15    Comment: Performed at [facility], [address], [place], [place] [address]  Ethanol     Status: None   Collection Time: 08/09/22 12:20 AM  Result Value Ref Range   Alcohol, Ethyl (B) <10 <10 mg/dL    Comment: (NOTE) Lowest detectable limit for serum alcohol is 10 mg/dL.  For medical purposes only. Performed at [facility], [address] [place], [place] [address]   hCG, serum, qualitative     Status: None   Collection Time: 08/09/22 12:20 AM  Result Value Ref Range   Preg, Serum NEGATIVE NEGATIVE    Comment:        THE SENSITIVITY OF THIS METHODOLOGY IS >10 mIU/mL. Performed at [facility], [address], [place], [place] [address]   Salicylate level     Status: Abnormal   Collection Time: 08/09/22 12:20 AM  Result Value Ref Range   Salicylate Lvl <7.0 (L) 7.0 - 30.0 mg/dL    Comment: Performed at [facility], [address], [place], [place] [address]  Acetaminophen level     Status: Abnormal   Collection Time: 08/09/22 12:20 AM  Result Value Ref Range   Acetaminophen (Tylenol), Serum <10 (L) 10 - 30 ug/mL    Comment: (NOTE) Therapeutic concentrations vary significantly. A range of 10-30 ug/mL  may be an effective concentration for many patients. However, some  are best treated at concentrations outside of this range. Acetaminophen concentrations >150 ug/mL at 4 hours after ingestion  and >50 ug/mL at 12 hours after ingestion are often associated with  toxic reactions.  Performed at [facility], [address] [place], [place] [address]   CBC with Differential/Platelet     Status: Abnormal   Collection Time: 08/09/22  2:16 AM  Result Value Ref Range   WBC 10.2 4.0 - 10.5 K/uL   RBC 4.06 3.87 - 5.11 MIL/uL   Hemoglobin 10.1 (L) 12.0 - 15.0 g/dL   HCT 96.2 (L) 95.2 - 84.1 %   MCV 79.8 (L) 80.0 - 100.0 fL   MCH 24.9 (L) 26.0 - 34.0 pg   MCHC 31.2 30.0 - 36.0  g/dL   RDW 81.1 91.4 - 78.2 %   Platelets 304 150 - 400 K/uL   nRBC 0.0 0.0 - 0.2 %   Neutrophils Relative % 67 %   Neutro Abs 6.9 1.7 - 7.7 K/uL   Lymphocytes Relative 26 %   Lymphs Abs 2.6 0.7 - 4.0 K/uL   Monocytes Relative 5 %   Monocytes Absolute 0.6 0.1 - 1.0 K/uL   Eosinophils Relative 1 %   Eosinophils Absolute 0.1 0.0 - 0.5 K/uL   Basophils Relative 1 %   Basophils Absolute 0.1 0.0 - 0.1 K/uL   Immature Granulocytes 0 %   Abs Immature Granulocytes 0.03 0.00 - 0.07 K/uL    Comment: Performed at [facility], [address], [place], [place] [address]  Acetaminophen level     Status: Abnormal   Collection Time: 08/09/22  2:17 AM  Result Value Ref Range   Acetaminophen (Tylenol), Serum <10 (L) 10 - 30 ug/mL    Comment: (NOTE) Therapeutic concentrations vary significantly. A range of 10-30 ug/mL  may be an effective concentration for many patients. However, some  are best treated  at concentrations outside of this range. Acetaminophen concentrations >150 ug/mL at 4 hours after ingestion  and >50 ug/mL at 12 hours after ingestion are often associated with  toxic reactions.  Performed at [facility], [address], [place], [place] [address]   Urine rapid drug screen (hosp performed)     Status: None   Collection Time: 08/09/22  8:41 AM  Result Value Ref Range   Opiates NONE DETECTED NONE DETECTED   Cocaine NONE DETECTED NONE DETECTED   Benzodiazepines NONE DETECTED NONE DETECTED   Amphetamines NONE DETECTED NONE DETECTED   Tetrahydrocannabinol NONE DETECTED NONE DETECTED   Barbiturates NONE DETECTED NONE DETECTED    Comment: (NOTE) DRUG SCREEN FOR MEDICAL PURPOSES ONLY.  IF CONFIRMATION IS NEEDED FOR ANY PURPOSE, NOTIFY LAB WITHIN 5 DAYS.  LOWEST DETECTABLE LIMITS FOR URINE DRUG SCREEN Drug Class                     Cutoff (ng/mL) Amphetamine and metabolites    1000 Barbiturate and metabolites    200 Benzodiazepine                 200 Opiates and metabolites        300 Cocaine and metabolites        300 THC                            50 Performed at [facility], [address], [place], [place] [address]   CBG monitoring, ED     Status: Abnormal   Collection Time: 08/09/22  8:50 AM  Result Value Ref Range   Glucose-Capillary 120 (H) 70 - 99 mg/dL    Comment: Glucose reference range applies only to samples taken after fasting for at least 8 hours.  Basic metabolic panel     Status: Abnormal   Collection Time: 08/09/22  9:32 AM  Result Value Ref Range   Sodium 135 135 - 145 mmol/L   Potassium 4.2 3.5 - 5.1 mmol/L   Chloride 105 98 - 111 mmol/L   CO2 24 22 - 32 mmol/L   Glucose, Bld 110 (H) 70 - 99 mg/dL    Comment: Glucose reference range applies only to samples taken after fasting for at least 8 hours.  BUN 13 6 - 20 mg/dL   Creatinine, Ser 6.43 0.44 - 1.00 mg/dL   Calcium 9.0 8.9 - 32.9 mg/dL   GFR,  Estimated >51 >88 mL/min    Comment: (NOTE) Calculated using the CKD-EPI Creatinine Equation (2021)    Anion gap 6 5 - 15    Comment: Performed at [facility], [address], [place], [place] [address]  Blood gas, venous     Status: Abnormal   Collection Time: 08/09/22 11:43 AM  Result Value Ref Range   pH, Ven 7.37 7.25 - 7.43   pCO2, Ven 52 44 - 60 mmHg   pO2, Ven <31 (LL) 32 - 45 mmHg    Comment: CRITICAL RESULT CALLED TO, READ BACK BY AND VERIFIED WITH: FOX B. RN AT 1202 ON 08/09/2022 BY MECIAL J.    Bicarbonate 30.1 (H) 20.0 - 28.0 mmol/L   Acid-Base Excess 3.6 (H) 0.0 - 2.0 mmol/L   O2 Saturation 38.3 %   Patient temperature 37.0     Comment: Performed at [facility], [address], [place], [place] [address]  Ammonia     Status: None   Collection Time: 08/09/22 11:43 AM  Result Value Ref Range   Ammonia 13 9 - 35 umol/L    Comment: Performed at [facility], [address], [place], [place] [address]  Magnesium     Status: None   Collection Time: 08/09/22  2:18 PM  Result Value Ref Range   Magnesium 1.8 1.7 - 2.4 mg/dL    Comment: Performed at [facility], [address] [place], [place] [address]    Current Facility-Administered Medications  Medication Dose Route Frequency Provider Last Rate Last Admin   albuterol (VENTOLIN HFA) 108 (90 Base) MCG/ACT inhaler 2 puff  2 puff Inhalation Q6H PRN Arby Barrette, MD       lactated ringers infusion   Intravenous Continuous Arby Barrette, MD 125 mL/hr at 08/09/22 1311 New Bag at 08/09/22 1311   mometasone-formoterol (DULERA) 200-5 MCG/ACT inhaler 2 puff  2 puff Inhalation BID Arby Barrette, MD       OLANZapine zydis (ZYPREXA) disintegrating tablet 10 mg  10 mg Oral QHS Pfeiffer, Lebron Conners, MD       pantoprazole (PROTONIX) EC tablet 40 mg  40 mg Oral Daily Arby Barrette, MD       Current Outpatient Medications  Medication Sig Dispense Refill    benztropine (COGENTIN) 1 MG tablet Take 1 mg by mouth at bedtime.     fluticasone-salmeterol (ADVAIR) 250-50 MCG/ACT AEPB 1 puff 2 (two) times daily.     albuterol (VENTOLIN HFA) 108 (90 Base) MCG/ACT inhaler Inhale 2 puffs into the lungs every 6 (six) hours as needed for wheezing or shortness of breath.     BREYNA 160-4.5 MCG/ACT inhaler Inhale 1 puff into the lungs in the morning and at bedtime.     cyclobenzaprine (FLEXERIL) 10 MG tablet Take 10 mg by mouth at bedtime.     diclofenac Sodium (VOLTAREN) 1 % GEL Apply 2 g topically 2 (two) times daily. 2 g 0   escitalopram (LEXAPRO) 20 MG tablet Take 1 tablet (20 mg total) by mouth daily.     gabapentin (NEURONTIN) 600 MG tablet Take 600 mg by mouth 3 (three) times daily.     hydrOXYzine (ATARAX) 25 MG tablet Take 1 tablet (25 mg total) by mouth 3 (three) times daily as needed for anxiety. 30 tablet 0   melatonin 3  MG TABS tablet Take 1 tablet (3 mg total) by mouth at bedtime. 30 tablet 0   metoprolol tartrate (LOPRESSOR) 25 MG tablet Take 0.5 tablets (12.5 mg total) by mouth 2 (two) times daily for 20 days. 20 tablet 0   OLANZapine zydis (ZYPREXA) 10 MG disintegrating tablet Take 1 tablet (10 mg total) by mouth at bedtime.     pantoprazole (PROTONIX) 40 MG tablet Take 40 mg by mouth daily.     prazosin (MINIPRESS) 1 MG capsule Take 1 mg by mouth at bedtime.      Musculoskeletal: Strength & Muscle Tone: within normal limits and decreased Gait & Station: normal Patient leans: N/A   Psychiatric Specialty Exam: Presentation  General Appearance:  Appropriate for Environment  Eye Contact: Good  Speech: Slurred  Speech Volume: Normal  Handedness: Right   Mood and Affect  Mood: Euthymic  Affect: Appropriate   Thought Process  Thought Processes: Coherent  Descriptions of Associations:Intact  Orientation:Full (Time, Place and Person)  Thought Content:Perseveration; WDL  History of Schizophrenia/Schizoaffective  disorder:Yes  Duration of Psychotic Symptoms:N/A  Hallucinations:Hallucinations: None  Ideas of Reference:None  Suicidal Thoughts:Suicidal Thoughts: Yes, Active SI Active Intent and/or Plan: With Plan  Homicidal Thoughts:Homicidal Thoughts: No   Sensorium  Memory: Immediate Good; Recent Good  Judgment: Impaired  Insight: Shallow   Executive Functions  Concentration: Fair  Attention Span: Fair  Recall: Fair  Fund of Knowledge: Fair  Language: Good   Psychomotor Activity  Psychomotor Activity: Psychomotor Activity: Increased   Assets  Assets: Communication Skills; Desire for Improvement; Social Support; Housing    Sleep  Sleep: Sleep: Good   Physical Exam: Physical Exam Vitals and nursing note reviewed. Exam conducted with a chaperone present.  Neurological:     Mental Status: She is alert.  Psychiatric:        Attention and Perception: Attention normal.        Mood and Affect: Mood is depressed. Affect is blunt.        Speech: Speech is rapid and pressured and slurred.        Behavior: Behavior is cooperative.        Thought Content: Thought content includes suicidal ideation. Thought content includes suicidal plan.        Cognition and Memory: Memory normal.        Judgment: Judgment is impulsive and inappropriate.    Review of Systems  Constitutional: Negative.   Psychiatric/Behavioral:  Positive for depression and suicidal ideas.    Blood pressure 103/60, pulse 80, temperature 98.3 F (36.8 C), temperature source Oral, resp. rate 18, last menstrual period 07/22/2022, SpO2 99 %. There is no height or weight on file to calculate BMI.   Medical Decision Making: Pt case reviewed and discussed with Dr. Lucianne Muss. Patient needs inpatient psychiatric admission for stabilization and treatment. [facility] and CSW notified of disposition. Will restart patient home medications. EDP, RN, and LCSW notified of disposition.     Disposition: Recommend  psychiatric Inpatient admission.   Alona Bene, PMHNP 08/09/2022 5:31 PM

## 2022-08-09 NOTE — Progress Notes (Signed)
LCSW Progress Note  [id number]   [place] Senft  08/09/2022  11:04 PM  On 08/09/22 @ 2104 CSW faxed pt to out of network providers.  Inpatient Behavioral Health Placement  Pt meets inpatient criteria per Alona Bene, PMHNP. There are no available beds within [facility] system per [facility] [employer]. Referral was sent to the following facilities;   Destination  Service Provider Address Phone [facility]  [address] [place], [place] [address] [phone]  [facility]  [address] [place] [address] [phone]  [facility]  [address] [place] [address] [phone]  [facility]  [address] [place] [address] [phone]  [facility] [address] [place]  [address], [place] [address] [phone]  [facility]  [address] [place], [place] [address] [phone]  [facility] [place] [address] [phone]  [facility]  [address], [place] [address] [phone]  [facility]  [address] [place] [address] [phone]  [facility]  [address] [place] [address] [phone]  [facility]  [address] [place] [address] [phone]  [facility]  [address] [place] [address] [phone]  [facility]  [address] [place] [address] [phone]  [facility]  [address], [place] [address] [phone]  [facility]  [address], [place] [address] [phone]  [facility]  [address] [place] [address] Insert SmartText   [phone]  [facility]  [address], [place] [address] [phone]  [facility]  [address] [place] [place] [address] [phone]  [facility]  [address] [place] [address] [phone]  [facility]  [address], [place] [address] [phone]  [facility]  [address] [place] [address] [phone]  [facility]  [address], [place] [address] [phone]  [facility]  [address] [place] [address] [phone]  [facility]  [address] [place] [address] [phone]  [facility]  [address] [place] [address] [phone]  [facility]  [address] [place] [address] [phone]    Situation ongoing,  CSW will follow up.    Maryjean Ka, MSW, [facility] 08/09/2022 11:04 PM

## 2022-08-09 NOTE — ED Notes (Signed)
Pt belongings is in the cabinet listed patients belongings 1-4 she has 1 bag

## 2022-08-09 NOTE — ED Notes (Signed)
Brought patient [place] sandwich with cheese and caffeine-free coke. Patient ate all of snack and took and shower. Changed scrubs and we changed patient's sheets.

## 2022-08-09 NOTE — ED Notes (Signed)
Pt calm and cooperative since transferring to room 27. No mania, AVH, or paranoia reported. Currently sleeping. Will continue to monitor.

## 2022-08-09 NOTE — ED Provider Notes (Signed)
Attempted 2 x today to see this patient, patient has been sleeping, and when aroused while sleeping, she wakes up yelling and screaming and quickly drifts off back to sleep. Will attempt later, asked RN to inform me when patient wakes up to eat.

## 2022-08-09 NOTE — ED Provider Notes (Addendum)
Patient had overdose with reported multiple substance of prescription medications.  She was evaluated and plan was for 6 hours of observation.  She had been medically cleared.  However, when nursing ambulated the patient the bathroom she was ataxic and required significant assistance and then repeat blood pressures showed systolic blood pressure of 80 with pale appearance.  I have reassessed the patient.  The orthostatic vital signs are positive. Physical Exam  BP (!) 145/104   Pulse 88   Temp 98.4 F (36.9 C) (Oral)   Resp 18   LMP 07/22/2022 (Approximate)   SpO2 (!) 88%   Physical Exam Constitutional:      Comments: Patient is groggy.  Respirations are nonlabored.  She is pale in appearance.  HENT:     Mouth/Throat:     Mouth: Mucous membranes are dry.     Pharynx: Oropharynx is clear.  Cardiovascular:     Rate and Rhythm: Normal rate and regular rhythm.  Pulmonary:     Effort: Pulmonary effort is normal.     Breath sounds: Normal breath sounds.  Abdominal:     General: There is no distension.     Palpations: Abdomen is soft.     Tenderness: There is no abdominal tenderness.  Musculoskeletal:        General: No swelling or tenderness.     Right lower leg: No edema.     Left lower leg: No edema.  Skin:    General: Skin is warm and dry.     Coloration: Skin is pale.  Neurological:     Comments: Patient is somnolent.  When partially awake she mumbles nonsensically.  When fully awake and she is irritated and situationally oriented.  She remains somewhat ataxic when standing.  Orthostatic vital signs were positive.  She does have a purposely use all 4 extremities.  No focal deficit.     Procedures  Procedures  ED Course / MDM    Medical Decision Making Amount and/or Complexity of Data Reviewed Labs: ordered. Radiology: ordered.  Risk Prescription drug management.   Repeat CBG 120. Patient does have positive orthostatic vital signs.  Heart rate remains controlled  sinus rhythm narrow complex in the 80s on the monitor.  Will administer 1 L lactated Ringer's.  Will repeat 1 basic chemistry panel.  Patient had polysubstance ingestion by report.  Possible substances include Neurontin, Lexapro, Zyprexa, Protonix, prazosin, Flexeril, Lopressor.  Patient has total time in the emergency department of 10 hours.  At this point there does not appear to be toxic beta-blocker effect with heart rate.  Heart rates remained consistently in the 80s with narrow complex sinus rhythm.  Other medications may be pivoting to somnolence and ataxia include Neurontin, Lexapro, Flexeril.  This then may be contributing to low blood pressure.  Will continue to observe and administer IV fluid.  Patient's mental status indicates somnolence and hostility when fully awake but no focal neurologic deficit.  I believe this will clear and is stable for continued observation.  CRITICAL CARE Performed by: Arby Barrette   Total critical care time: 30 minutes  Critical care time was exclusive of separately billable procedures and treating other patients.  Critical care was necessary to treat or prevent imminent or life-threatening deterioration.  Critical care was time spent personally by me on the following activities: development of treatment plan with patient and/or surrogate as well as nursing, discussions with consultants, evaluation of patient's response to treatment, examination of patient, obtaining history from patient or surrogate,  ordering and performing treatments and interventions, ordering and review of laboratory studies, ordering and review of radiographic studies, pulse oximetry and re-evaluation of patient's condition.    1030: Rechecked patient continues to be somnolent.  She will awaken to stimulus and verbal and hostile objection to being awakened.  She does have sleep apnea and will drift down into the 80s and then come back up into the high 90s.  Pressures now stable low teens.   Will try 1 mg of Narcan and dictated Ringer's at maintenance rate.  11: 30 patient's nurse tech advised that the patient is now awake and eating a meal tray.  However she advises that the patient's systolic blood pressure read at 70.  The Narcan had not been given.  I assessed the patient.  She is awake with good color.  She is aggressively eating a breakfast meal.  Repeat blood pressure is 90s systolic.  I question whether or not this is a read error because clinically the patient does not appear hypotensive.  Will run a 500 cc bolus and maintenance rate and recheck.  At this time with patient awake and focused on eating, no indication for Narcan.  I believe her desaturations are most due to sleep apnea with central obesity.  12:09 patient is somnolent again.  She will awaken to stimulus and becomes frustrated and hostile.  When fully awake and by stimulus she is situationally oriented.  When lightly awakened her speech is rambling and tangential as she drifts off to sleep again.  Consistent with medication sedation.  VBG obtained and pH 7.3 and pCO2 52.  12: 23 patient has now eaten and awakens to stimulus.  She is situationally oriented.  Will repeat orthostatic vital signs and determine if medically clear now for TTS.  Orthostatic vital signs repeated.  Stable with less than 20 mm drop.  Patient has now been awake and aggressive with nursing staff, spitting and kicking.  At this point, medically cleared for TTS evaluation.  Patient's level of verbal interaction at this point consistent with volitional decision making.  Total observation time post possible ingestion now is 13-1/2 hours.  Do not suspect patient will have emergent outcome from medications possibly overdosed on.  Patient likely benefit from CPAP for obesity hypoventilation.  Medically cleared for TTS.  14: 05 patient sleeps but with stimulus opens up her eyes and is clearly situationally aware.  She is hostile and aggressive to staff  while awake.    Arby Barrette, MD 08/09/22 1914    Arby Barrette, MD 08/09/22 1224    Arby Barrette, MD 08/09/22 1251    Arby Barrette, MD 08/09/22 1406

## 2022-08-09 NOTE — ED Provider Notes (Signed)
[place] EMERGENCY DEPARTMENT AT [facility] Provider Note   CSN: [id number] Arrival date & time: 08/08/22  2313     History  Chief Complaint  Patient presents with   Suicide Attempt    [name] is a 32 y.o. female.  32 yo F with a chief complaints of reported suicide attempts.  The patient reportedly took 30 Neurontin 10 Lexapro's 10 Zyprexa Zydis 10 Protonix 10 prazosin 10 Flexeril's and 7 Lopressor's.  She thinks she took this about an hour or so ago.  Says she took it to end her life.  She then took the time to pack a suitcase and decided come to the ER to be evaluated.        Home Medications Prior to Admission medications   Medication Sig Start Date End Date Taking? Authorizing Provider  albuterol (VENTOLIN HFA) 108 (90 Base) MCG/ACT inhaler Inhale 2 puffs into the lungs every 6 (six) hours as needed for wheezing or shortness of breath.    [provider]  BREYNA 160-4.5 MCG/ACT inhaler Inhale 1 puff into the lungs in the morning and at bedtime. 04/18/22   [provider]  cyclobenzaprine (FLEXERIL) 10 MG tablet Take 10 mg by mouth at bedtime. 05/10/22   [provider]  diclofenac Sodium (VOLTAREN) 1 % GEL Apply 2 g topically 2 (two) times daily. 06/11/22   Starleen Blue, NP  escitalopram (LEXAPRO) 20 MG tablet Take 1 tablet (20 mg total) by mouth daily. 06/24/22   White, Patrice L, NP  gabapentin (NEURONTIN) 600 MG tablet Take 600 mg by mouth 3 (three) times daily. 06/29/22   [provider]  hydrOXYzine (ATARAX) 25 MG tablet Take 1 tablet (25 mg total) by mouth 3 (three) times daily as needed for anxiety. 06/11/22   Starleen Blue, NP  melatonin 3 MG TABS tablet Take 1 tablet (3 mg total) by mouth at bedtime. 08/02/22   Princess Bruins, DO  metoprolol tartrate (LOPRESSOR) 25 MG tablet Take 0.5 tablets (12.5 mg total) by mouth 2 (two) times daily for 20 days. 08/05/22 08/25/22  Nira Conn, MD  OLANZapine zydis  (ZYPREXA) 10 MG disintegrating tablet Take 1 tablet (10 mg total) by mouth at bedtime. 06/23/22   White, Patrice L, NP  pantoprazole (PROTONIX) 40 MG tablet Take 40 mg by mouth daily.    [provider]  prazosin (MINIPRESS) 1 MG capsule Take 1 mg by mouth at bedtime.    [provider]      Allergies    Bee venom, Coconut flavor, Fish allergy, Geodon [ziprasidone hcl], Haloperidol and related, Lithobid [lithium], Roxicodone [oxycodone], Seroquel [quetiapine], Shellfish allergy, Phenergan [promethazine hcl], Prilosec [omeprazole], Sulfa antibiotics, Tegretol [carbamazepine], Prozac [fluoxetine], Tape, and Tylenol [acetaminophen]    Review of Systems   Review of Systems  Physical Exam Updated Vital Signs BP (!) 100/56 (BP Location: Right Arm)   Pulse 86   Temp 98.4 F (36.9 C) (Oral)   Resp 20   LMP 07/22/2022 (Approximate)   SpO2 95%  Physical Exam Vitals and nursing note reviewed.  Constitutional:      General: She is not in acute distress.    Appearance: She is well-developed. She is not diaphoretic.  HENT:     Head: Normocephalic and atraumatic.  Eyes:     Pupils: Pupils are equal, round, and reactive to light.  Cardiovascular:     Rate and Rhythm: Normal rate and regular rhythm.     Heart sounds: No murmur heard.  No friction rub. No gallop.  Pulmonary:     Effort: Pulmonary effort is normal.     Breath sounds: No wheezing or rales.  Abdominal:     General: There is no distension.     Palpations: Abdomen is soft.     Tenderness: There is no abdominal tenderness.  Musculoskeletal:        General: No tenderness.     Cervical back: Normal range of motion and neck supple.  Skin:    General: Skin is warm and dry.  Neurological:     Mental Status: She is alert and oriented to person, place, and time.  Psychiatric:        Behavior: Behavior normal.     ED Results / Procedures / Treatments   Labs (all labs ordered are listed, but only abnormal  results are displayed) Labs Reviewed  COMPREHENSIVE METABOLIC PANEL - Abnormal; Notable for the following components:      Result Value   Glucose, Bld 109 (*)    All other components within normal limits  SALICYLATE LEVEL - Abnormal; Notable for the following components:   Salicylate Lvl <7.0 (*)    All other components within normal limits  ACETAMINOPHEN LEVEL - Abnormal; Notable for the following components:   Acetaminophen (Tylenol), Serum <10 (*)    All other components within normal limits  CBC WITH DIFFERENTIAL/PLATELET - Abnormal; Notable for the following components:   Hemoglobin 10.1 (*)    HCT 32.4 (*)    MCV 79.8 (*)    MCH 24.9 (*)    All other components within normal limits  ACETAMINOPHEN LEVEL - Abnormal; Notable for the following components:   Acetaminophen (Tylenol), Serum <10 (*)    All other components within normal limits  ETHANOL  HCG, SERUM, QUALITATIVE  RAPID URINE DRUG SCREEN, HOSP PERFORMED  CBC WITH DIFFERENTIAL/PLATELET    EKG EKG Interpretation Date/Time:  Thursday August 09 2022 00:40:33 EDT Ventricular Rate:  99 PR Interval:  141 QRS Duration:  71 QT Interval:  361 QTC Calculation: 464 R Axis:   60  Text Interpretation: Sinus rhythm No significant change since last tracing Confirmed by Melene Plan [phone]) on 08/09/2022 12:48:43 AM  Radiology No results found.  Procedures Procedures    Medications Ordered in ED Medications - No data to display  ED Course/ Medical Decision Making/ A&P                             Medical Decision Making Amount and/or Complexity of Data Reviewed Labs: ordered.   32 yo F with a chief complaint of suicidal ideation.  Patient said that she took multiple medications in attempt to end her life.  She has no symptoms currently.  Says she took them about 2 hours ago.  Case was discussed with poison control who recommended 6-hour observation.  I am worried that the patient is maybe is here for secondary gain.  I  saw the patient about a week ago where she had attempted to stay overnight in the emergency department.  She had packed a bag prior to coming today.  Patient's blood work is unremarkable.  Tonsils that levels are negative.  4-hour Tylenol was sent and is also negative.  Patient continues to be completely asymptomatic which makes me think she likely did not take any medicines prior to coming.  I feel she is medically clear for TTS evaluation.  The patients results and plan were reviewed  and discussed.   Any x-rays performed were independently reviewed by myself.   Differential diagnosis were considered with the presenting HPI.  Medications - No data to display  Vitals:   08/08/22 2322 08/09/22 0327 08/09/22 0422  BP:   (!) 100/56  Pulse:   86  Resp:   20  Temp:  98.4 F (36.9 C)   TempSrc:  Oral   SpO2: 99%  95%    Final diagnoses:  Suicide attempt [facility])           Final Clinical Impression(s) / ED Diagnoses Final diagnoses:  Suicide attempt [facility])    Rx / DC Orders ED Discharge Orders     None         Melene Plan, DO 08/09/22 1610

## 2022-08-09 NOTE — ED Notes (Addendum)
Pt. Had a outburst yelling she wanted to kill her family and nursing staff here at [facility].

## 2022-08-09 NOTE — ED Notes (Signed)
Pt is currently screaming threats such as "she wants to get a gun and kill all the nurses", she "is going to get a bomb and kill all you motherfuckers". Nurse asked her what is up setting her she stated she is tired of this place and she thinks her paranoia is getting worst.

## 2022-08-10 DIAGNOSIS — F251 Schizoaffective disorder, depressive type: Secondary | ICD-10-CM | POA: Diagnosis not present

## 2022-08-10 NOTE — ED Provider Notes (Signed)
Emergency Medicine Observation Re-evaluation Note  [name] is a 32 y.o. female, seen on rounds today.  Pt initially presented to the ED for complaints of Suicide Attempt Currently, the patient is resting.  Physical Exam  BP (!) 116/56 (BP Location: Left Arm)   Pulse 81   Temp 98.5 F (36.9 C) (Oral)   Resp 20   LMP 07/22/2022 (Approximate)   SpO2 99%  Physical Exam General: resting comfortably, NAD Lungs: normal WOB Psych: currently calm and resting  ED Course / MDM  EKG:EKG Interpretation Date/Time:  Thursday August 09 2022 00:40:33 EDT Ventricular Rate:  99 PR Interval:  141 QRS Duration:  71 QT Interval:  361 QTC Calculation: 464 R Axis:   60  Text Interpretation: Sinus rhythm No significant change since last tracing Confirmed by Melene Plan [phone]) on 08/09/2022 12:48:43 AM  I have reviewed the labs performed to date as well as medications administered while in observation.  Recent changes in the last 24 hours include none.  Plan  Current plan is for TTS evaluation.    Rozelle Logan, DO 08/10/22 6045

## 2022-08-10 NOTE — Progress Notes (Signed)
Pt was accepted to [facility] 08/11/2022, pending IVC paperwork faxed to [phone]  Pt meets inpatient criteria per Alona Bene, PMHNP  Attending Physician will be Dr. Leavy Cella, MD  Report can be called to: [phone] (ask for behavioral health)  Pt can arrive after 8 AM  Care Team Notified: Sherian Rein, RN and [facility], PMHNP  Cathie Beams, [place]  08/10/2022 9:41 AM

## 2022-08-10 NOTE — ED Notes (Signed)
Joey from [facility] says their are considering acceptance of pt. The pt has to be IVC'd. He requests a face sheet, notes from poison control clearing pt, and last 3 sets labs be faxed to [phone]. He can be reached at [phone].

## 2022-08-10 NOTE — ED Notes (Addendum)
[facility] anticipates transporting pt on Sunday.

## 2022-08-10 NOTE — ED Notes (Addendum)
Poison Control has called to check on patient. They recommend a repeat EKG after she wakes up. Notified Dr. Nicanor Alcon.   4098 - Order placed and EKG completed and results called to Poison Control. Patient has now been cleared by Poison Control.

## 2022-08-10 NOTE — ED Notes (Signed)
Pt calm and cooperative throughout shift. Compliant with medication and vitals. Performs ADLs independently. Denies SI, HI, AVH. Expressed no delusions or paranoid thought content. Pt requesting to leave ASAP because she needs to pick up paperwork on Monday for her job through [employer]. 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

## 2022-08-10 NOTE — Progress Notes (Signed)
[facility] Psych ED Progress Note  08/10/2022 6:40 PM [name]  MRN:  [id number]    Principal Problem: Schizoaffective disorder, depressive type [facility]) Diagnosis:  Principal Problem:   Schizoaffective disorder, depressive type (HCC) Active Problems:   Asperger syndrome   Intentional overdose [facility])   ED Assessment Time Calculation: Start Time: 1400 Stop Time: 1420 Total Time in Minutes (Assessment Completion): 20   Subjective:  On evaluation today, the patient is sitting on the side of her bed.  She is calm and cooperative during this assessment. Her appearance is appropriate for environment. Her eye contact is good. Speech is clear and coherent, normal pace and normal volume.  She reports her mood is "good".  Affect is congruent with mood.  Thought process is coherent.  Thought content is slightly logical.  She denies auditory and visual hallucinations.  No indication that she is responding to internal stimuli during this assessment.  No delusions elicited during this assessment.  She denies suicidal ideations.  She denies homicidal ideations. Appetite and sleep are fair.  Patient is asking if she can go home, states she is ready to be discharged.  Provider discussed with her the incident that led her to being in the emergency department, and how she made impulsive decisions.  She states that she was impulsive due to unexpected events, such as her landlord increasing her rent and she felt like she was blindsided, she states now she knows how to deal with it and will deal with unexpected events by "praying."She states that she has a job to go to on Monday for [employer], and will be her first day and she does not want to miss it, states she has already filled out the paperwork and the job is hers.  Patient does not take into account what has brought her to the emergency department and that what she has done is serious and harmful to herself.  Patient continues to ask when can she be discharged.   Provider continues to recommend inpatient admission, despite previous safety plans, mitigation of suicide risks, and treatment adherence; she continues to remain a danger to herself at this time.     Past Psychiatric History: schizoaffective-bipolar type, GAD, PTSD, carrier of fragile x syndrome, ASD v asperger syndrome, borderline personality, NSSIB (cutting), fibromyalgia, multiple suicide attempt, multiple inpt psych admission   [place] Scale:  Flowsheet Row ED from 08/08/2022 in [facility] Department at [facility] ED from 08/04/2022 in [facility] Emergency Department at [facility] Admission (Discharged[facility]  C-SSRS RISK CATEGORY No Risk No Risk High Risk       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    Chronic constipation    Depression    Drug-seeking behavior    Essential tremor    Headache    Ineffective individual coping 05/16/2022   Insomnia 01/12/2022   Intentional drug overdose (HCC) 06/05/2022   Neuromuscular disorder (HCC)    Normocytic anemia 06/05/2022   Overdose 07/22/2017   Overdose of acetaminophen 07/2017   and other meds  Overdose, intentional self-harm, initial encounter (HCC) 07/20/2021   Paranoia (HCC) 04/22/2021   Personality disorder (HCC)    Purposeful non-suicidal drug ingestion (HCC) 06/27/2021   Schizo-affective psychosis (HCC)    Schizoaffective disorder (HCC) 07/29/2022   Schizoaffective disorder, bipolar type (HCC)    Seizures (HCC)    Last seizure December 2017   Skin erythema 04/27/2022   Sleep apnea    Suicidal  behavior 07/25/2021   Suicidal ideation    Suicide (HCC) 07/01/2021   Suicide attempt (HCC) 07/04/2021    Past Surgical History:  Procedure Laterality Date   MOUTH SURGERY  2009 or 2010   Family History:  Family History  Problem Relation Age of Onset   Mental illness Father    Asthma Father    PDD Brother    Seizures Brother    Social History:  Social History   Substance and Sexual Activity  Alcohol Use No   Alcohol/week: 1.0 standard drink of alcohol   Types: 1 Standard drinks or equivalent per week   Comment: denies at this time     Social History   Substance and Sexual Activity  Drug Use No   Comment: History of cocaine use at age 35 for 4 months    Social History   Socioeconomic History   Marital status: Widowed    Spouse name: Not on file   Number of children: 0   Years of education: Not on file   Highest education level: Not on file  Occupational History   Occupation: disability  Tobacco Use   Smoking status: Former    Packs/day: 0    Types: Cigarettes   Smokeless tobacco: Never   Tobacco comments:    Smoked for 2  years age [age]  Vaping Use   Vaping Use: Never used  Substance and Sexual Activity   Alcohol use: No    Alcohol/week: 1.0 standard drink of alcohol    Types: 1 Standard drinks or equivalent per week    Comment: denies at this time   Drug use: No    Comment: History of cocaine use at age 17 for 4 months   Sexual activity: Not Currently    Birth control/protection: None  Other Topics Concern   Not on file  Social History Narrative   Marital status: Widowed      Children: daughter      Lives: with boyfriend, in two story home      Employment:  Disability      Tobacco: quit smoking; smoked for two years.      Alcohol ;none      Drugs: none   Has not traveled outside of the country.   Right handed         Patient with hx of Fibromyalgia,Orthostatic Tachycardia,Asthma,Arthritis,Gerd, ASD   Social Determinants of Health    Financial Resource Strain: Not on file  Food Insecurity: No Food Insecurity (07/29/2022)   Hunger Vital Sign    Worried About Running Out of Food in the Last Year: Never true    Ran Out of Food in the Last Year: Never true  Transportation Needs: No Transportation Needs (07/29/2022)   PRAPARE - Administrator, Civil Service (Medical): No    Lack of Transportation (Non-Medical): No  Recent Concern: Transportation Needs - Unmet Transportation Needs (05/06/2022)   PRAPARE - Administrator, Civil Service (Medical): Yes    Lack of Transportation (Non-Medical): Yes  Physical Activity: Not on file  Stress: Not  on file  Social Connections: Not on file    Sleep: Fair  Appetite:  Fair  Current Medications: Current Facility-Administered Medications  Medication Dose Route Frequency Provider Last Rate Last Admin   albuterol (VENTOLIN HFA) 108 (90 Base) MCG/ACT inhaler 2 puff  2 puff Inhalation Q6H PRN Arby Barrette, MD       lactated ringers infusion   Intravenous Continuous Arby Barrette, MD   Stopped at 08/09/22 1630   mometasone-formoterol (DULERA) 200-5 MCG/ACT inhaler 2 puff  2 puff Inhalation BID Arby Barrette, MD       OLANZapine zydis (ZYPREXA) disintegrating tablet 10 mg  10 mg Oral QHS Arby Barrette, MD   10 mg at 08/09/22 2114   pantoprazole (PROTONIX) EC tablet 40 mg  40 mg Oral Daily Arby Barrette, MD   40 mg at 08/10/22 1031   Current Outpatient Medications  Medication Sig Dispense Refill   albuterol (VENTOLIN HFA) 108 (90 Base) MCG/ACT inhaler Inhale 2 puffs into the lungs every 6 (six) hours as needed for wheezing or shortness of breath.     BREYNA 160-4.5 MCG/ACT inhaler Inhale 2 puffs into the lungs in the morning and at bedtime.     cyclobenzaprine (FLEXERIL) 10 MG tablet Take 10 mg by mouth at bedtime.     escitalopram (LEXAPRO) 20 MG tablet Take 1 tablet (20 mg total) by mouth daily.     gabapentin (NEURONTIN) 600 MG tablet Take 600 mg by  mouth 3 (three) times daily.     hydrOXYzine (ATARAX) 25 MG tablet Take 1 tablet (25 mg total) by mouth 3 (three) times daily as needed for anxiety. 30 tablet 0   metoprolol tartrate (LOPRESSOR) 25 MG tablet Take 0.5 tablets (12.5 mg total) by mouth 2 (two) times daily for 20 days. 20 tablet 0   OLANZapine (ZYPREXA) 10 MG tablet Take 10 mg by mouth at bedtime.     pantoprazole (PROTONIX) 40 MG tablet Take 40 mg by mouth daily before breakfast.     prazosin (MINIPRESS) 1 MG capsule Take 1 mg by mouth at bedtime.     pregabalin (LYRICA) 100 MG capsule Take 100 mg by mouth 3 (three) times daily.     traZODone (DESYREL) 50 MG tablet Take 50 mg by mouth at bedtime as needed for sleep.     diclofenac Sodium (VOLTAREN) 1 % GEL Apply 2 g topically 2 (two) times daily. (Patient not taking: Reported on 08/09/2022) 2 g 0   melatonin 3 MG TABS tablet Take 1 tablet (3 mg total) by mouth at bedtime. (Patient not taking: Reported on 08/09/2022) 30 tablet 0   OLANZapine zydis (ZYPREXA) 10 MG disintegrating tablet Take 1 tablet (10 mg total) by mouth at bedtime. (Patient not taking: Reported on 08/09/2022)      Lab Results:  Results for orders placed or performed during the hospital encounter of 08/08/22 (from the past 48 hour(s))  Comprehensive metabolic panel     Status: Abnormal   Collection Time: 08/09/22 12:20 AM  Result Value Ref Range   Sodium 136 135 - 145 mmol/L   Potassium 4.1 3.5 - 5.1 mmol/L   Chloride 105 98 - 111 mmol/L   CO2 23 22 - 32 mmol/L   Glucose, Bld 109 (H) 70 - 99 mg/dL    Comment: Glucose reference range applies only to samples taken after fasting for at least 8 hours.   BUN 13 6 - 20 mg/dL   Creatinine, Ser 1.61 0.44 - 1.00 mg/dL   Calcium  9.0 8.9 - 10.3 mg/dL   Total Protein 7.0 6.5 - 8.1 g/dL   Albumin 3.5 3.5 - 5.0 g/dL   AST 20 15 - 41 U/L   ALT 22 0 - 44 U/L   Alkaline Phosphatase 67 38 - 126 U/L   Total Bilirubin 0.3 0.3 - 1.2 mg/dL   GFR, Estimated >16 >10 mL/min     Comment: (NOTE) Calculated using the CKD-EPI Creatinine Equation (2021)    Anion gap 8 5 - 15    Comment: Performed at [facility], [address], [place], [place] [address]  Ethanol     Status: None   Collection Time: 08/09/22 12:20 AM  Result Value Ref Range   Alcohol, Ethyl (B) <10 <10 mg/dL    Comment: (NOTE) Lowest detectable limit for serum alcohol is 10 mg/dL.  For medical purposes only. Performed at [facility], [address] [place], [place] [address]   hCG, serum, qualitative     Status: None   Collection Time: 08/09/22 12:20 AM  Result Value Ref Range   Preg, Serum NEGATIVE NEGATIVE    Comment:        THE SENSITIVITY OF THIS METHODOLOGY IS >10 mIU/mL. Performed at [facility], [address] [place], [place] [address]   Salicylate level     Status: Abnormal   Collection Time: 08/09/22 12:20 AM  Result Value Ref Range   Salicylate Lvl <7.0 (L) 7.0 - 30.0 mg/dL    Comment: Performed at [facility], [address] [place], [place] [address]  Acetaminophen level     Status: Abnormal   Collection Time: 08/09/22 12:20 AM  Result Value Ref Range   Acetaminophen (Tylenol), Serum <10 (L) 10 - 30 ug/mL    Comment: (NOTE) Therapeutic concentrations vary significantly. A range of 10-30 ug/mL  may be an effective concentration for many patients. However, some  are best treated at concentrations outside of this range. Acetaminophen concentrations >150 ug/mL at 4 hours after ingestion  and >50 ug/mL at 12 hours after ingestion are often associated with  toxic reactions.  Performed at [facility], [address], [place], [place] [address]   CBC with Differential/Platelet     Status: Abnormal   Collection Time: 08/09/22  2:16 AM  Result Value Ref Range   WBC 10.2 4.0 - 10.5 K/uL   RBC 4.06 3.87 - 5.11 MIL/uL   Hemoglobin 10.1 (L) 12.0 - 15.0 g/dL   HCT 65.7 (L) 84.6 -  46.0 %   MCV 79.8 (L) 80.0 - 100.0 fL   MCH 24.9 (L) 26.0 - 34.0 pg   MCHC 31.2 30.0 - 36.0 g/dL   RDW 96.2 95.2 - 84.1 %   Platelets 304 150 - 400 K/uL   nRBC 0.0 0.0 - 0.2 %   Neutrophils Relative % 67 %   Neutro Abs 6.9 1.7 - 7.7 K/uL   Lymphocytes Relative 26 %   Lymphs Abs 2.6 0.7 - 4.0 K/uL   Monocytes Relative 5 %   Monocytes Absolute 0.6 0.1 - 1.0 K/uL   Eosinophils Relative 1 %   Eosinophils Absolute 0.1 0.0 - 0.5 K/uL   Basophils Relative 1 %   Basophils Absolute 0.1 0.0 - 0.1 K/uL   Immature Granulocytes 0 %   Abs Immature Granulocytes 0.03 0.00 - 0.07 K/uL    Comment: Performed at [facility], [address] [place], [place] [address]  Acetaminophen level  Status: Abnormal   Collection Time: 08/09/22  2:17 AM  Result Value Ref Range   Acetaminophen (Tylenol), Serum <10 (L) 10 - 30 ug/mL    Comment: (NOTE) Therapeutic concentrations vary significantly. A range of 10-30 ug/mL  may be an effective concentration for many patients. However, some  are best treated at concentrations outside of this range. Acetaminophen concentrations >150 ug/mL at 4 hours after ingestion  and >50 ug/mL at 12 hours after ingestion are often associated with  toxic reactions.  Performed at [facility], [address], [place], [place] [address]   Urine rapid drug screen (hosp performed)     Status: None   Collection Time: 08/09/22  8:41 AM  Result Value Ref Range   Opiates NONE DETECTED NONE DETECTED   Cocaine NONE DETECTED NONE DETECTED   Benzodiazepines NONE DETECTED NONE DETECTED   Amphetamines NONE DETECTED NONE DETECTED   Tetrahydrocannabinol NONE DETECTED NONE DETECTED   Barbiturates NONE DETECTED NONE DETECTED    Comment: (NOTE) DRUG SCREEN FOR MEDICAL PURPOSES ONLY.  IF CONFIRMATION IS NEEDED FOR ANY PURPOSE, NOTIFY LAB WITHIN 5 DAYS.  LOWEST DETECTABLE LIMITS FOR URINE DRUG SCREEN Drug Class                     Cutoff  (ng/mL) Amphetamine and metabolites    1000 Barbiturate and metabolites    200 Benzodiazepine                 200 Opiates and metabolites        300 Cocaine and metabolites        300 THC                            50 Performed at [facility], [address] [place], [place] [address]   CBG monitoring, ED     Status: Abnormal   Collection Time: 08/09/22  8:50 AM  Result Value Ref Range   Glucose-Capillary 120 (H) 70 - 99 mg/dL    Comment: Glucose reference range applies only to samples taken after fasting for at least 8 hours.  Basic metabolic panel     Status: Abnormal   Collection Time: 08/09/22  9:32 AM  Result Value Ref Range   Sodium 135 135 - 145 mmol/L   Potassium 4.2 3.5 - 5.1 mmol/L   Chloride 105 98 - 111 mmol/L   CO2 24 22 - 32 mmol/L   Glucose, Bld 110 (H) 70 - 99 mg/dL    Comment: Glucose reference range applies only to samples taken after fasting for at least 8 hours.   BUN 13 6 - 20 mg/dL   Creatinine, Ser 0.98 0.44 - 1.00 mg/dL   Calcium 9.0 8.9 - 11.9 mg/dL   GFR, Estimated >14 >78 mL/min    Comment: (NOTE) Calculated using the CKD-EPI Creatinine Equation (2021)    Anion gap 6 5 - 15    Comment: Performed at [facility], [address], [place], [place] [address]  Blood gas, venous     Status: Abnormal   Collection Time: 08/09/22 11:43 AM  Result Value Ref Range   pH, Ven 7.37 7.25 - 7.43   pCO2, Ven 52 44 - 60 mmHg   pO2, Ven <31 (LL) 32 - 45 mmHg    Comment: CRITICAL RESULT CALLED TO, READ BACK BY AND VERIFIED WITH: FOX B. RN AT 1202 ON 08/09/2022 BY [employer]  J.    Bicarbonate 30.1 (H) 20.0 - 28.0 mmol/L   Acid-Base Excess 3.6 (H) 0.0 - 2.0 mmol/L   O2 Saturation 38.3 %   Patient temperature 37.0     Comment: Performed at [facility], [address] [place], [place] [address]  Ammonia     Status: None   Collection Time: 08/09/22 11:43 AM  Result Value Ref Range   Ammonia 13 9 - 35 umol/L     Comment: Performed at [facility], [address] [place], [place] [address]  Magnesium     Status: None   Collection Time: 08/09/22  2:18 PM  Result Value Ref Range   Magnesium 1.8 1.7 - 2.4 mg/dL    Comment: Performed at [name], [address] [place], [place] [address]    Blood Alcohol level:  Lab Results  Component Value Date   [facility] <10 08/09/2022   ETH <10 07/29/2022    Physical Findings:  CIWA:    COWS:     Musculoskeletal: Strength & Muscle Tone: within normal limits Gait & Station: normal Patient leans: N/A  Psychiatric Specialty Exam:  Presentation  General Appearance:  Appropriate for Environment  Eye Contact: Good  Speech: Clear and Coherent  Speech Volume: Normal  Handedness: Right   Mood and Affect  Mood: Euthymic  Affect: Appropriate   Thought Process  Thought Processes: Coherent  Descriptions of Associations:Intact  Orientation:Full (Time, Place and Person)  Thought Content:WDL  History of Schizophrenia/Schizoaffective disorder:Yes  Duration of Psychotic Symptoms:N/A  Hallucinations:Hallucinations: None  Ideas of Reference:None  Suicidal Thoughts:Suicidal Thoughts: Yes, Passive SI Active Intent and/or Plan: With Plan  Homicidal Thoughts:Homicidal Thoughts: No   Sensorium  Memory: Immediate Good; Recent Good  Judgment: Poor  Insight: Fair   Chartered certified accountant: Fair  Attention Span: Fair  Recall: Fair  Fund of Knowledge: Good  Language: Good   Psychomotor Activity  Psychomotor Activity: Psychomotor Activity: Normal   Assets  Assets: Communication Skills; Desire for Improvement; Housing   Sleep  Sleep: Sleep: Good    Physical Exam: Physical Exam Vitals and nursing note reviewed. Exam conducted with a chaperone present.  Neurological:     Mental Status: She is alert.  Psychiatric:        Attention and Perception:  Attention normal.        Mood and Affect: Mood is anxious.        Speech: Speech normal.        Behavior: Behavior is cooperative.        Thought Content: Thought content includes suicidal ideation.        Cognition and Memory: Memory normal.        Judgment: Judgment is impulsive and inappropriate.    Review of Systems  Constitutional: Negative.   Psychiatric/Behavioral:  Positive for depression and suicidal ideas.    Blood pressure (!) 115/51, pulse 91, temperature 98.3 F (36.8 C), temperature source Oral, resp. rate 16, last menstrual period 07/22/2022, SpO2 99 %. There is no height or weight on file to calculate BMI.   Medical Decision Making: Pt case reviewed and discussed with Dr. Lucianne Muss. Patient needs inpatient psychiatric admission for stabilization and treatment. Pt was accepted to [facility] 08/11/2022    [name], PMHNP 08/10/2022, 6:40 PM

## 2022-08-11 DIAGNOSIS — F251 Schizoaffective disorder, depressive type: Secondary | ICD-10-CM | POA: Diagnosis not present

## 2022-08-11 MED ORDER — PRAZOSIN HCL 1 MG PO CAPS
1.0000 mg | ORAL_CAPSULE | Freq: Every day | ORAL | Status: DC
  Administered 2022-08-11: 1 mg via ORAL
  Filled 2022-08-11: qty 1

## 2022-08-11 MED ORDER — IBUPROFEN 200 MG PO TABS
400.0000 mg | ORAL_TABLET | Freq: Once | ORAL | Status: AC
  Administered 2022-08-11: 400 mg via ORAL
  Filled 2022-08-11: qty 2

## 2022-08-11 NOTE — ED Notes (Signed)
Nurse received pt lying in bed watching tv sitter at door.

## 2022-08-11 NOTE — ED Notes (Signed)
Pt calm and cooperative throughout shift. Did not require redirection. Compliant with medications and vitals. Performs ADLs independently. Denies AVH, SI, self-injury, HI. Voiced no paranoid or delusional content. Pt showered at 14:25. Pt requesting to discharge soon because she has a job opportunity with [employer], and paperwork and TB test need to be completed Monday.

## 2022-08-11 NOTE — Progress Notes (Cosign Needed Addendum)
[facility] Psych ED Progress Note  08/11/2022 3:16 PM [name]  MRN:  [id number]  Principal Problem: Schizoaffective disorder, depressive type (HCC) Diagnosis:  Principal Problem:   Schizoaffective disorder, depressive type (HCC) Active Problems:   Asperger syndrome   Intentional overdose [facility])   ED Assessment Time Calculation: Start Time: 1451 Stop Time: 1515 Total Time in Minutes (Assessment Completion): 24   Subjective:   [name] is a 32 y.o. female with PMH of  schizoaffective-bipolar type, GAD, PTSD, carrier of fragile x syndrome, ASD v asperger syndrome, borderline personality, NSSIB (cutting), fibromyalgia, multiple suicide attempt, multiple inpt psych admission, who presented voluntary to [facility] (07/29/2022) via [employer], then transferred Voluntary to [facility] (07/29/2022) for suicidal ideation  Patient was seen for daily rounding today and patient was seen calm  and cooperative.  Patient denies SI/HI/AVH and no paranoia.  Patient admitted to OD on Medications after her [name] increased her rent by $150 per month.  Patient also said that she now has a job to do to help her pay for the extra rent.  She also admitted her frequency of ER Visit and inpatient Psychiatry hospitalization.  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  Provider educated patient on the responsibility that goes with this job and safety expected.  Patient states that doing this job will help her keep busy and reorganized and rehabilitate herself.  Patient is looking forward to starting the job and asked to be discharged today. DR Jannifer Franklin, Psychiatrist was informed of patient's request for discharge today.  He request for discharge today was declined based on past hx of suicide attempts and frequency of hospitalizations.  We will reevaluate in am and determine if it  is safe to discharge her.  She denies SI/HI/AVH. Past Psychiatric History: schizoaffective-bipolar type, GAD, PTSD, carrier of  fragile x syndrome, ASD v asperger syndrome, borderline personality, NSSIB (cutting), fibromyalgia, multiple suicide attempt, multiple inpt psych admission   [place] Scale:  Flowsheet Row ED from 08/08/2022 in [facility] Emergency Department at [facility] ED from 08/04/2022 in [name] Emergency Department at [facility] Admission (Discharged[facility]  C-SSRS RISK CATEGORY No Risk No Risk High Risk       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    Chronic constipation    Depression    Drug-seeking behavior    Essential tremor    Headache    Ineffective individual coping 05/16/2022   Insomnia 01/12/2022   Intentional drug overdose (HCC) 06/05/2022   Neuromuscular disorder (HCC)    Normocytic anemia 06/05/2022   Overdose 07/22/2017   Overdose of acetaminophen 07/2017   and other meds   Overdose, intentional self-harm, initial encounter (HCC) 07/20/2021   Paranoia (HCC) 04/22/2021   Personality disorder (HCC)    Purposeful non-suicidal drug ingestion (HCC) 06/27/2021   Schizo-affective psychosis (HCC)    Schizoaffective disorder (HCC) 07/29/2022   Schizoaffective disorder, bipolar type (HCC)    Seizures (HCC)    Last seizure December 2017   Skin erythema 04/27/2022  Sleep apnea    Suicidal behavior 07/25/2021   Suicidal ideation    Suicide (HCC) 07/01/2021   Suicide attempt (HCC) 07/04/2021    Past Surgical History:  Procedure Laterality Date   MOUTH SURGERY  2009 or 2010   Family History:  Family History  Problem Relation Age of Onset   Mental illness  Father    Asthma Father    PDD Brother    Seizures Brother    Social History:  Social History   Substance and Sexual Activity  Alcohol Use No   Alcohol/week: 1.0 standard drink of alcohol   Types: 1 Standard drinks or equivalent per week   Comment: denies at this time     Social History   Substance and Sexual Activity  Drug Use No   Comment: History of cocaine use at age 52 for 4 months    Social History   Socioeconomic History   Marital status: Widowed    Spouse name: Not on file   Number of children: 0   Years of education: Not on file   Highest education level: Not on file  Occupational History   Occupation: disability  Tobacco Use   Smoking status: Former    Packs/day: 0    Types: Cigarettes   Smokeless tobacco: Never   Tobacco comments:    Smoked for 2  years age [age]  Vaping Use   Vaping Use: Never used  Substance and Sexual Activity   Alcohol use: No    Alcohol/week: 1.0 standard drink of alcohol    Types: 1 Standard drinks or equivalent per week    Comment: denies at this time   Drug use: No    Comment: History of cocaine use at age 27 for 4 months   Sexual activity: Not Currently    Birth control/protection: None  Other Topics Concern   Not on file  Social History Narrative   Marital status: Widowed      Children: daughter      Lives: with boyfriend, in two story home      Employment:  Disability      Tobacco: quit smoking; smoked for two years.      Alcohol ;none      Drugs: none   Has not traveled outside of the country.   Right handed         Patient with hx of Fibromyalgia,Orthostatic Tachycardia,Asthma,Arthritis,Gerd, ASD   Social Determinants of Health   Financial Resource Strain: Not on file  Food Insecurity: No Food Insecurity (07/29/2022)   Hunger Vital Sign    Worried About Running Out of Food in the Last Year: Never true    Ran Out of Food in the Last Year: Never true  Transportation Needs: No Transportation Needs (07/29/2022)    PRAPARE - Administrator, Civil Service (Medical): No    Lack of Transportation (Non-Medical): No  Recent Concern: Transportation Needs - Unmet Transportation Needs (05/06/2022)   PRAPARE - Administrator, Civil Service (Medical): Yes    Lack of Transportation (Non-Medical): Yes  Physical Activity: Not on file  Stress: Not on file  Social Connections: Not on file    Sleep: Fair  Appetite:  Fair  Current Medications: Current Facility-Administered Medications  Medication Dose Route Frequency Provider Last Rate Last Admin   albuterol (VENTOLIN HFA) 108 (90 Base) MCG/ACT inhaler 2 puff  2 puff Inhalation Q6H PRN Arby Barrette, MD       lactated ringers infusion  Intravenous Continuous Arby Barrette, MD   Stopped at 08/09/22 1630   mometasone-formoterol (DULERA) 200-5 MCG/ACT inhaler 2 puff  2 puff Inhalation BID Arby Barrette, MD   2 puff at 08/11/22 0848   OLANZapine zydis (ZYPREXA) disintegrating tablet 10 mg  10 mg Oral QHS Arby Barrette, MD   10 mg at 08/10/22 2057   pantoprazole (PROTONIX) EC tablet 40 mg  40 mg Oral Daily Arby Barrette, MD   40 mg at 08/11/22 1002   Current Outpatient Medications  Medication Sig Dispense Refill   albuterol (VENTOLIN HFA) 108 (90 Base) MCG/ACT inhaler Inhale 2 puffs into the lungs every 6 (six) hours as needed for wheezing or shortness of breath.     BREYNA 160-4.5 MCG/ACT inhaler Inhale 2 puffs into the lungs in the morning and at bedtime.     cyclobenzaprine (FLEXERIL) 10 MG tablet Take 10 mg by mouth at bedtime.     escitalopram (LEXAPRO) 20 MG tablet Take 1 tablet (20 mg total) by mouth daily.     gabapentin (NEURONTIN) 600 MG tablet Take 600 mg by mouth 3 (three) times daily.     hydrOXYzine (ATARAX) 25 MG tablet Take 1 tablet (25 mg total) by mouth 3 (three) times daily as needed for anxiety. 30 tablet 0   metoprolol tartrate (LOPRESSOR) 25 MG tablet Take 0.5 tablets (12.5 mg total) by mouth 2 (two) times  daily for 20 days. 20 tablet 0   OLANZapine (ZYPREXA) 10 MG tablet Take 10 mg by mouth at bedtime.     pantoprazole (PROTONIX) 40 MG tablet Take 40 mg by mouth daily before breakfast.     prazosin (MINIPRESS) 1 MG capsule Take 1 mg by mouth at bedtime.     pregabalin (LYRICA) 100 MG capsule Take 100 mg by mouth 3 (three) times daily.     traZODone (DESYREL) 50 MG tablet Take 50 mg by mouth at bedtime as needed for sleep.     diclofenac Sodium (VOLTAREN) 1 % GEL Apply 2 g topically 2 (two) times daily. (Patient not taking: Reported on 08/09/2022) 2 g 0   melatonin 3 MG TABS tablet Take 1 tablet (3 mg total) by mouth at bedtime. (Patient not taking: Reported on 08/09/2022) 30 tablet 0   OLANZapine zydis (ZYPREXA) 10 MG disintegrating tablet Take 1 tablet (10 mg total) by mouth at bedtime. (Patient not taking: Reported on 08/09/2022)      Lab Results:  No results found for this or any previous visit (from the past 48 hour(s)).   Blood Alcohol level:  Lab Results  Component Value Date   ETH <10 08/09/2022   ETH <10 07/29/2022    Physical Findings:  CIWA:    COWS:     Musculoskeletal: Strength & Muscle Tone: within normal limits Gait & Station: normal Patient leans: N/A  Psychiatric Specialty Exam:  Presentation  General Appearance:  Casual; Neat  Eye Contact: Good  Speech: Clear and Coherent; Normal Rate  Speech Volume: Normal  Handedness: Right   Mood and Affect  Mood: Euthymic  Affect: Congruent   Thought Process  Thought Processes: Coherent; Goal Directed; Linear  Descriptions of Associations:Intact  Orientation:Full (Time, Place and Person)  Thought Content:Logical  History of Schizophrenia/Schizoaffective disorder:Yes  Duration of Psychotic Symptoms:N/A  Hallucinations:Hallucinations: None  Ideas of Reference:None  Suicidal Thoughts:Suicidal Thoughts: No  Homicidal Thoughts:Homicidal Thoughts: No   Sensorium  Memory: Immediate Good; Recent  Good; Remote Good  Judgment: Good  Insight: Good   Executive Functions  Concentration:  Good  Attention Span: Good  Recall: Good  Fund of Knowledge: Good  Language: Good   Psychomotor Activity  Psychomotor Activity: Psychomotor Activity: Normal   Assets  Assets: Communication Skills; Housing; Physical Health; Social Support   Sleep  Sleep: Sleep: Good    Physical Exam: Physical Exam Vitals and nursing note reviewed. Exam conducted with a chaperone present.  Neurological:     Mental Status: She is alert.  Psychiatric:        Attention and Perception: Attention normal.        Speech: Speech normal.        Behavior: Behavior is cooperative.        Cognition and Memory: Memory normal.        Judgment: Judgment is impulsive.    Review of Systems  Constitutional: Negative.    Blood pressure 113/77, pulse 99, temperature 98.1 F (36.7 C), temperature source Oral, resp. rate 20, last menstrual period 07/22/2022, SpO2 100 %. There is no height or weight on file to calculate BMI.   Medical Decision Making: Pt case reviewed and discussed with Dr. Lucianne Muss. Patient needs inpatient psychiatric admission for stabilization and treatment. Pt was is accepted to [facility] depending whenever transportation will be available. Meanwhile she is compliant with her Medications.   Earney Navy, NP--PMHNP-BC 08/11/2022, 3:16 PM

## 2022-08-11 NOTE — ED Provider Notes (Signed)
Emergency Medicine Observation Re-evaluation Note  Azure Bogarin is a 32 y.o. female, seen on rounds today.  Pt initially presented to the ED for complaints of Suicide Attempt Currently, the patient is awake and alert.  Pt said she is feeling better today.  Nursing reports no problems overnight.  Physical Exam  BP 124/67 (BP Location: Left Arm)   Pulse 95   Temp 97.6 F (36.4 C) (Oral)   Resp 20   LMP 07/22/2022 (Approximate)   SpO2 100%  Physical Exam General: awake and alert Cardiac: rr Lungs: clear Psych: calm  ED Course / MDM  EKG:EKG Interpretation Date/Time:  Thursday August 09 2022 13:18:43 EDT Ventricular Rate:  79 PR Interval:  146 QRS Duration:  91 QT Interval:  428 QTC Calculation: 491 R Axis:   70  Text Interpretation: Sinus rhythm ST elev, probable normal early repol pattern no sig change from previous Confirmed by Arby Barrette [phone]) on 08/10/2022 10:12:28 AM  I have reviewed the labs performed to date as well as medications administered while in observation.  Recent changes in the last 24 hours include none.  Plan  Current plan is for possible discharge?    Jacalyn Lefevre, MD 08/11/22 [phone]

## 2022-08-12 DIAGNOSIS — F251 Schizoaffective disorder, depressive type: Secondary | ICD-10-CM | POA: Diagnosis not present

## 2022-08-12 MED ORDER — IBUPROFEN 800 MG PO TABS
800.0000 mg | ORAL_TABLET | Freq: Once | ORAL | Status: AC
  Administered 2022-08-12: 800 mg via ORAL
  Filled 2022-08-12: qty 1

## 2022-08-12 NOTE — ED Provider Notes (Signed)
Emergency Medicine Observation Re-evaluation Note  [name] is a 32 y.o. female, seen on rounds today.  Pt initially presented to the ED for complaints of Suicide Attempt Currently, the patient is awake and alert.  She is calm.  Physical Exam  BP 116/66 (BP Location: Left Arm)   Pulse 98   Temp 97.9 F (36.6 C) (Oral)   Resp 18   LMP 07/22/2022 (Approximate)   SpO2 100%  Physical Exam General: awake Cardiac: rr Lungs: clear Psych: calm  ED Course / MDM  EKG:EKG Interpretation Date/Time:  Friday August 10 2022 01:52:00 EDT Ventricular Rate:  80 PR Interval:  138 QRS Duration:  84 QT Interval:  426 QTC Calculation: 491 R Axis:   37  Text Interpretation: Normal sinus rhythm Prolonged QT Abnormal ECG When compared with ECG of 10-Aug-2022 01:51, PREVIOUS ECG IS PRESENT No sig changes Confirmed by Alvester Chou [phone]) on 08/11/2022 9:13:25 AM  I have reviewed the labs performed to date as well as medications administered while in observation.  Recent changes in the last 24 hours include none.  Plan  Current plan is for inpatient placement.    Jacalyn Lefevre, MD 08/12/22 [phone]

## 2022-08-12 NOTE — ED Notes (Signed)
Report called to Wendy,RN ([phone]) at [facility].  She is requesting a call back once transport arrives.

## 2022-08-12 NOTE — ED Notes (Signed)
Sheriff called for transport. Left VM.   

## 2022-08-20 ENCOUNTER — Ambulatory Visit (INDEPENDENT_AMBULATORY_CARE_PROVIDER_SITE_OTHER)
Admission: EM | Admit: 2022-08-20 | Discharge: 2022-08-22 | Disposition: A | Discharge status: Home / Self Care | Payer: Medicare PPO | Source: Home / Self Care

## 2022-08-20 DIAGNOSIS — F439 Reaction to severe stress, unspecified: Secondary | ICD-10-CM

## 2022-08-20 DIAGNOSIS — J45909 Unspecified asthma, uncomplicated: Secondary | ICD-10-CM | POA: Insufficient documentation

## 2022-08-20 DIAGNOSIS — Z79899 Other long term (current) drug therapy: Secondary | ICD-10-CM | POA: Insufficient documentation

## 2022-08-20 DIAGNOSIS — R45851 Suicidal ideations: Secondary | ICD-10-CM | POA: Insufficient documentation

## 2022-08-20 DIAGNOSIS — Z7951 Long term (current) use of inhaled steroids: Secondary | ICD-10-CM | POA: Insufficient documentation

## 2022-08-20 DIAGNOSIS — F250 Schizoaffective disorder, bipolar type: Secondary | ICD-10-CM

## 2022-08-20 DIAGNOSIS — F840 Autistic disorder: Secondary | ICD-10-CM | POA: Insufficient documentation

## 2022-08-20 DIAGNOSIS — Z791 Long term (current) use of non-steroidal anti-inflammatories (NSAID): Secondary | ICD-10-CM | POA: Insufficient documentation

## 2022-08-20 DIAGNOSIS — F419 Anxiety disorder, unspecified: Secondary | ICD-10-CM | POA: Insufficient documentation

## 2022-08-20 DIAGNOSIS — F251 Schizoaffective disorder, depressive type: Secondary | ICD-10-CM | POA: Insufficient documentation

## 2022-08-20 DIAGNOSIS — G4730 Sleep apnea, unspecified: Secondary | ICD-10-CM | POA: Insufficient documentation

## 2022-08-20 DIAGNOSIS — Z9151 Personal history of suicidal behavior: Secondary | ICD-10-CM | POA: Insufficient documentation

## 2022-08-20 LAB — CBC WITH DIFFERENTIAL/PLATELET
Abs Immature Granulocytes: 0.03 10*3/uL (ref 0.00–0.07)
Basophils Absolute: 0.1 10*3/uL (ref 0.0–0.1)
Basophils Relative: 1 %
Eosinophils Absolute: 0.1 10*3/uL (ref 0.0–0.5)
Eosinophils Relative: 1 %
HCT: 38.5 % (ref 36.0–46.0)
Hemoglobin: 11.9 g/dL — ABNORMAL LOW (ref 12.0–15.0)
Immature Granulocytes: 0 %
Lymphocytes Relative: 28 %
Lymphs Abs: 2.3 10*3/uL (ref 0.7–4.0)
MCH: 24.8 pg — ABNORMAL LOW (ref 26.0–34.0)
MCHC: 30.9 g/dL (ref 30.0–36.0)
MCV: 80.2 fL (ref 80.0–100.0)
Monocytes Absolute: 0.5 10*3/uL (ref 0.1–1.0)
Monocytes Relative: 6 %
Neutro Abs: 5.2 10*3/uL (ref 1.7–7.7)
Neutrophils Relative %: 64 %
Platelets: 372 10*3/uL (ref 150–400)
RBC: 4.8 MIL/uL (ref 3.87–5.11)
RDW: 14.9 % (ref 11.5–15.5)
WBC: 8.2 10*3/uL (ref 4.0–10.5)
nRBC: 0 % (ref 0.0–0.2)

## 2022-08-20 LAB — POCT URINE DRUG SCREEN - MANUAL ENTRY (I-SCREEN)
POC Amphetamine UR: NOT DETECTED
POC Buprenorphine (BUP): NOT DETECTED
POC Cocaine UR: NOT DETECTED
POC Marijuana UR: NOT DETECTED
POC Methadone UR: NOT DETECTED
POC Methamphetamine UR: NOT DETECTED
POC Morphine: NOT DETECTED
POC Oxazepam (BZO): NOT DETECTED
POC Oxycodone UR: NOT DETECTED
POC Secobarbital (BAR): NOT DETECTED

## 2022-08-20 LAB — COMPREHENSIVE METABOLIC PANEL
ALT: 19 U/L (ref 0–44)
AST: 20 U/L (ref 15–41)
Albumin: 3.9 g/dL (ref 3.5–5.0)
Alkaline Phosphatase: 84 U/L (ref 38–126)
Anion gap: 9 (ref 5–15)
BUN: 10 mg/dL (ref 6–20)
CO2: 22 mmol/L (ref 22–32)
Calcium: 9.3 mg/dL (ref 8.9–10.3)
Chloride: 107 mmol/L (ref 98–111)
Creatinine, Ser: 0.7 mg/dL (ref 0.44–1.00)
GFR, Estimated: >60 mL/min (ref >60)
Glucose, Bld: 90 mg/dL (ref 70–99)
Potassium: 3.6 mmol/L (ref 3.5–5.1)
Sodium: 138 mmol/L (ref 135–145)
Total Bilirubin: 0.1 mg/dL — ABNORMAL LOW (ref 0.3–1.2)
Total Protein: 7.7 g/dL (ref 6.5–8.1)

## 2022-08-20 LAB — POCT PREGNANCY, URINE: Preg Test, Ur: NEGATIVE

## 2022-08-20 LAB — POC URINE PREG, ED: Preg Test, Ur: NEGATIVE

## 2022-08-20 MED ORDER — IBUPROFEN 400 MG PO TABS
400.0000 mg | ORAL_TABLET | Freq: Four times a day (QID) | ORAL | Status: DC | PRN
  Administered 2022-08-21: 400 mg via ORAL
  Filled 2022-08-20: qty 1

## 2022-08-20 MED ORDER — CYCLOBENZAPRINE HCL 10 MG PO TABS
10.0000 mg | ORAL_TABLET | Freq: Every day | ORAL | Status: DC
  Administered 2022-08-20 – 2022-08-21 (×2): 10 mg via ORAL
  Filled 2022-08-20 (×2): qty 1

## 2022-08-20 MED ORDER — ALUM & MAG HYDROXIDE-SIMETH 200-200-20 MG/5ML PO SUSP: 30.0000 mL | ORAL | Status: DC | PRN

## 2022-08-20 MED ORDER — HYDROXYZINE HCL 25 MG PO TABS
25.0000 mg | ORAL_TABLET | Freq: Three times a day (TID) | ORAL | Status: DC | PRN
  Administered 2022-08-21: 25 mg via ORAL
  Filled 2022-08-20: qty 1

## 2022-08-20 MED ORDER — METOPROLOL TARTRATE 25 MG PO TABS: 12.5000 mg | ORAL_TABLET | Freq: Every day | ORAL | Status: DC

## 2022-08-20 MED ORDER — MAGNESIUM HYDROXIDE 400 MG/5ML PO SUSP
30.0000 mL | Freq: Every day | ORAL | Status: DC | PRN
  Administered 2022-08-21: 30 mL via ORAL
  Filled 2022-08-20: qty 30

## 2022-08-20 MED ORDER — IBUPROFEN 400 MG PO TABS
400.0000 mg | ORAL_TABLET | Freq: Four times a day (QID) | ORAL | Status: DC | PRN
  Administered 2022-08-20: 400 mg via ORAL
  Filled 2022-08-20: qty 1

## 2022-08-20 MED ORDER — OLANZAPINE 10 MG PO TABS
10.0000 mg | ORAL_TABLET | Freq: Every day | ORAL | Status: DC
  Administered 2022-08-20 – 2022-08-21 (×2): 10 mg via ORAL
  Filled 2022-08-20 (×2): qty 1

## 2022-08-20 MED ORDER — PRAZOSIN HCL 1 MG PO CAPS
1.0000 mg | ORAL_CAPSULE | Freq: Every day | ORAL | Status: DC
  Administered 2022-08-20 – 2022-08-21 (×2): 1 mg via ORAL
  Filled 2022-08-20 (×2): qty 1

## 2022-08-20 MED ORDER — METOPROLOL TARTRATE 25 MG PO TABS
12.5000 mg | ORAL_TABLET | Freq: Every day | ORAL | Status: DC
  Administered 2022-08-21 – 2022-08-22 (×2): 12.5 mg via ORAL
  Filled 2022-08-20 (×2): qty 1

## 2022-08-20 MED ORDER — GABAPENTIN 300 MG PO CAPS
600.0000 mg | ORAL_CAPSULE | Freq: Three times a day (TID) | ORAL | Status: DC
  Administered 2022-08-20 – 2022-08-22 (×5): 600 mg via ORAL
  Filled 2022-08-20 (×5): qty 2

## 2022-08-20 NOTE — ED Notes (Signed)
Pt is A & O x 4. Pt is oriented to the unit and provided with meal. Pt complained of headache  with pain of 4/10.Marland Kitchen Pt denies SI/HI/AVH. Pt stated having suicidal thought  with plan of overdoing on medications or stabbing herself. When asked if she can contract for safety, pt sated "yes I can". Will continue to monitor for safety.

## 2022-08-20 NOTE — Progress Notes (Signed)
   08/20/22 1911  [facility] Triage Screening (Walk-ins at [facility] only)  How Did You Hear About [name]? Legal System  What Is the Reason for Your Visit/Call Today? Pt called [employer] to bring her to [facility].  Pt has been having thoughts of overdosing on pills, stabbing herself in the heart or "putting a hanger up me."  Patient said that she wants to get a job but she cannot do them due to heart health.  Patient has no current HI or A/V hallucinations.  Pt denies use of ETOH, marijuana or cocaine. She denies any access to weapons.  She says she lives with a friend.  She has ACCT team services through [employer].  How Long Has This Been Causing You Problems? 1 wk - 1 month  Have You Recently Had Any Thoughts About Hurting Yourself? Yes  How long ago did you have thoughts about hurting yourself? For the last week  Are You Planning to Commit Suicide/Harm Yourself At This time? Yes  Have you Recently Had Thoughts About Hurting Someone [name]? No  How long ago did you have thoughts of harming others? None  Are You Planning To Harm Someone At This Time? No  Explanation: Pt has plan to overdose on medications, stab hersefl in the heart or get a hanger and "put it up there."  Are you currently experiencing any auditory, visual or other hallucinations? No  Please explain the hallucinations you are currently experiencing: Not hearing voices currently.  Have You Used Any Alcohol or Drugs in the Past 24 Hours? No  What Did You Use and How Much? Denies use of ETOH or other illicit drugs.  Do you have any current medical co-morbidities that require immediate attention? Yes  Please describe current medical co-morbidities that require immediate attention: Asthma, tachicardia and fibromyalgia  Clinician description of patient physical appearance/behavior: Pt is calm at this time.  What Do You Feel Would Help You the Most Today? Treatment for Depression or other mood problem (Feels like she needs to go to [facility].)  If access to [facility]  was not available, would you have sought care in the Emergency Department? Yes  Determination of Need Urgent (48 hours)  Options For Referral Inpatient Hospitalization;[facility]
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
## 2022-08-20 NOTE — ED Provider Notes (Signed)
[facility] Continuous Assessment Admission H&P  Date: 08/20/22 Patient Name: [name] MRN: [id number] Chief Complaint: "I'm suicidal with several plans".  Diagnoses:  Final diagnoses:  Suicidal ideation  Ineffective individual coping  Schizoaffective disorder, depressive type (HCC)    HPI: [name]. [name] is a 32 year old female with psychiatric history of schizoaffective disorder mixed episodes, intentional drug overdose, ineffective individual coping, adjustment disorder with mixed anxiety and depressed mood, suicidal behavior, suicide attempt, paranoia, adjustment disorder with mixed disturbance of emotions and conduct, autism, drug seeking behavior, personality disorder, and sleep apnea who presented voluntarily to [facility] via [facility] due to suicidal ideation with several plans including overdosing on pills, stabbing herself in the heart with a knife, pulling her anger "up there", while pointing to her private area, and banging my head on the wall".   Patient was seen face-to-face by this provider and chart reviewed. Per chart review, patient is well-known to the behavioral health service line, and has made 34 ED/urgent care visits with different complaints within the past 6 months. Patient also has history of several inpatient psychiatric hospitalizations, with the most recent at an area hospital last month and was last admitted at the [facility] 07/29/22 for suicidal ideation.  On evaluation, patient is alert, oriented x 3, and cooperative. Speech is clear, and pressured. Pt appears casual.  Eye contact is fair. Mood is anxious, affect is congruent with mood. Thought process is goal directed and thought content is WDL. Pt endorses SI with several plans, denies /HI/AVH. There is no objective indication that the patient is responding to internal stimuli. No delusions elicited during this assessment.    On approach, patient is seated in the assessment room with the lights turned off.   After introductions, patient states "do you want me to give you the names of my medications". Provider responded in the affirmative and patient reports "Zyprexa 10 mg nightly, prazosin 1 mg nightly, metoprolol ER 12.5 mg every morning, Neurontin 600 mg 3 times daily, Lexapro 10 mg every morning, Flexeril 10 mg nightly, Vistaril 25 mg p.o. 3 times daily as needed, and ibuprofen for headaches because I'm allergic to Tylenol".  Patient reports she is still suicidal and is unable to contract for safety.  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  Patient does not think if I other stressors at this time and is requesting inpatient psychiatric admission. She has ACTT team services through [facility].   She denies substance use, and reports she lives with a friend.  She denies access to a gun or firearms.  Support, encouragement, reassurance provided about ongoing stressors.  Patient is provided with opportunity for questions.  Total Time spent with patient: 20 minutes  Musculoskeletal  Strength & Muscle Tone: within normal limits Gait & Station: normal Patient leans: N/A  Psychiatric Specialty Exam  Presentation General Appearance:  Casual  Eye Contact: Fair  Speech: Pressured  Speech Volume: Normal  Handedness: Right   Mood and Affect  Mood: Anxious  Affect: Congruent   Thought Process  Thought Processes: Goal Directed; Coherent  Descriptions of Associations:Intact  Orientation:Full (Time, Place and Person)  Thought Content:WDL  Diagnosis of Schizophrenia or Schizoaffective disorder in past: Yes  Duration of Psychotic Symptoms: Greater than six months  Hallucinations:Hallucinations: None  Ideas of Reference:None  Suicidal Thoughts:Suicidal Thoughts: Yes, Active SI Active Intent and/or Plan: With  Plan; With Intent; With Access to Means;  With Means to Carry Out  Homicidal Thoughts:Homicidal Thoughts: No   Sensorium  Memory: Immediate Fair  Judgment: Poor  Insight: Poor   Executive Functions  Concentration: Fair  Attention Span: Fair  Recall: [employer] of Knowledge: Fair  Language: Fair   Psychomotor Activity  Psychomotor Activity: Psychomotor Activity: Normal   Assets  Assets: Manufacturing systems engineer; Desire for Improvement   Sleep  Sleep: Sleep: Poor   Nutritional Assessment (For OBS and FBC admissions only) Has the patient had a weight loss or gain of 10 pounds or more in the last 3 months?: No Has the patient had a decrease in food intake/or appetite?: No Does the patient have dental problems?: No Does the patient have eating habits or behaviors that may be indicators of an eating disorder including binging or inducing vomiting?: No Has the patient recently lost weight without trying?: 0 Has the patient been eating poorly because of a decreased appetite?: 0 Malnutrition Screening Tool Score: 0    Physical Exam Constitutional:      General: She is not in acute distress.    Appearance: She is not diaphoretic.  HENT:     Head: Normocephalic.     Right Ear: External ear normal.     Left Ear: External ear normal.     Nose: No congestion.  Eyes:     General:        Right eye: No discharge.        Left eye: No discharge.  Cardiovascular:     Rate and Rhythm: Normal rate.  Pulmonary:     Effort: No respiratory distress.  Chest:     Chest wall: No tenderness.  Neurological:     Mental Status: She is alert and oriented to person, place, and time.  Psychiatric:        Attention and Perception: Attention and perception normal.        Mood and Affect: Mood is anxious and depressed. Affect is flat.        Speech: Speech normal.        Behavior: Behavior is cooperative.        Thought Content: Thought content is not paranoid or delusional. Thought content includes suicidal  ideation. Thought content does not include homicidal ideation. Thought content includes suicidal plan. Thought content does not include homicidal plan.        Judgment: Judgment is impulsive.    Review of Systems  Constitutional:  Negative for chills, diaphoresis and fever.  HENT:  Negative for congestion.   Respiratory:  Negative for cough, shortness of breath and wheezing.   Cardiovascular:  Negative for chest pain and palpitations.  Gastrointestinal:  Negative for diarrhea, nausea and vomiting.  Neurological:  Positive for headaches. Negative for dizziness, seizures, loss of consciousness and weakness.  Psychiatric/Behavioral:  Positive for depression and suicidal ideas. The patient is nervous/anxious.     Blood pressure 139/85, pulse 98, temperature 98.6 F (37 C), temperature source Oral, resp. rate 18, last menstrual period 07/22/2022, SpO2 100%. There is no height or weight on file to calculate BMI.  Past Psychiatric History:  schizoaffective-bipolar type, GAD, PTSD, carrier of fragile x syndrome, ASD v asperger syndrome, borderline personality, NSSIB (cutting), fibromyalgia, multiple suicide attempt, multiple inpt psych admission.   Is the patient at risk to self? Yes  Has the patient been a risk to self in the past 6 months? Yes .    Has the patient been  a risk to self within the distant past? Yes   Is the patient a risk to others? No   Has the patient been a risk to others in the past 6 months? No   Has the patient been a risk to others within the distant past? No   Past Medical History: See Chart  Family History: N/A  Social History: N/A  Last Labs:  Admission on 08/20/2022  Component Date Value Ref Range Status   Preg Test, Ur 08/20/2022 Negative  Negative Final   POC Amphetamine UR 08/20/2022 None Detected  NONE DETECTED (Cut Off Level 1000 ng/mL) Final   POC Secobarbital (BAR) 08/20/2022 None Detected  NONE DETECTED (Cut Off Level 300 ng/mL) Final   POC  Buprenorphine (BUP) 08/20/2022 None Detected  NONE DETECTED (Cut Off Level 10 ng/mL) Final   POC Oxazepam (BZO) 08/20/2022 None Detected  NONE DETECTED (Cut Off Level 300 ng/mL) Final   POC Cocaine UR 08/20/2022 None Detected  NONE DETECTED (Cut Off Level 300 ng/mL) Final   POC Methamphetamine UR 08/20/2022 None Detected  NONE DETECTED (Cut Off Level 1000 ng/mL) Final   POC Morphine 08/20/2022 None Detected  NONE DETECTED (Cut Off Level 300 ng/mL) Final   POC Methadone UR 08/20/2022 None Detected  NONE DETECTED (Cut Off Level 300 ng/mL) Final   POC Oxycodone UR 08/20/2022 None Detected  NONE DETECTED (Cut Off Level 100 ng/mL) Final   POC Marijuana UR 08/20/2022 None Detected  NONE DETECTED (Cut Off Level 50 ng/mL) Final   Preg Test, Ur 08/20/2022 NEGATIVE  NEGATIVE Final   Comment:        THE SENSITIVITY OF THIS METHODOLOGY IS >24 mIU/mL   Admission on 08/08/2022, Discharged on 08/12/2022  Component Date Value Ref Range Status   Sodium 08/09/2022 136  135 - 145 mmol/L Final   Potassium 08/09/2022 4.1  3.5 - 5.1 mmol/L Final   Chloride 08/09/2022 105  98 - 111 mmol/L Final   CO2 08/09/2022 23  22 - 32 mmol/L Final   Glucose, Bld 08/09/2022 109 (H)  70 - 99 mg/dL Final   Glucose reference range applies only to samples taken after fasting for at least 8 hours.   BUN 08/09/2022 13  6 - 20 mg/dL Final   Creatinine, Ser 08/09/2022 0.69  0.44 - 1.00 mg/dL Final   Calcium 62/69/4854 9.0  8.9 - 10.3 mg/dL Final   Total Protein 62/70/3500 7.0  6.5 - 8.1 g/dL Final   Albumin 93/81/8299 3.5  3.5 - 5.0 g/dL Final   AST 37/16/9678 20  15 - 41 U/L Final   ALT 08/09/2022 22  0 - 44 U/L Final   Alkaline Phosphatase 08/09/2022 67  38 - 126 U/L Final   Total Bilirubin 08/09/2022 0.3  0.3 - 1.2 mg/dL Final   GFR, Estimated 08/09/2022 >60  >60 mL/min Final   Comment: (NOTE) Calculated using the CKD-EPI Creatinine Equation (2021)    Anion gap 08/09/2022 8  5 - 15 Final   Performed at [facility], [address], [place], [place] [address]   Alcohol, Ethyl (B) 08/09/2022 <10  <10 mg/dL Final   Comment: (NOTE) Lowest detectable limit for serum alcohol is 10 mg/dL.  For medical purposes only. Performed at [facility], [address] [place], [place] [address]    Opiates 08/09/2022 NONE DETECTED  NONE DETECTED Final   Cocaine 08/09/2022 NONE DETECTED  NONE DETECTED Final   Benzodiazepines 08/09/2022 NONE DETECTED  NONE DETECTED  Final   Amphetamines 08/09/2022 NONE DETECTED  NONE DETECTED Final   Tetrahydrocannabinol 08/09/2022 NONE DETECTED  NONE DETECTED Final   Barbiturates 08/09/2022 NONE DETECTED  NONE DETECTED Final   Comment: (NOTE) DRUG SCREEN FOR MEDICAL PURPOSES ONLY.  IF CONFIRMATION IS NEEDED FOR ANY PURPOSE, NOTIFY LAB WITHIN 5 DAYS.  LOWEST DETECTABLE LIMITS FOR URINE DRUG SCREEN Drug Class                     Cutoff (ng/mL) Amphetamine and metabolites    1000 Barbiturate and metabolites    200 Benzodiazepine                 200 Opiates and metabolites        300 Cocaine and metabolites        300 THC                            50 Performed at [facility], [address] [place], [place] [address]    Preg, Serum 08/09/2022 NEGATIVE  NEGATIVE Final   Comment:        THE SENSITIVITY OF THIS METHODOLOGY IS >10 mIU/mL. Performed at [facility], [address] [place], [place] [address]    Salicylate Lvl 08/09/2022 <7.0 (L)  7.0 - 30.0 mg/dL Final   Performed at [facility], [address], [place], [place] [address]   Acetaminophen (Tylenol), Serum 08/09/2022 <10 (L)  10 - 30 ug/mL Final   Comment: (NOTE) Therapeutic concentrations vary significantly. A range of 10-30 ug/mL  may be an effective concentration for many patients. However, some  are best treated at concentrations outside of this range. Acetaminophen concentrations >150 ug/mL at 4 hours after  ingestion  and >50 ug/mL at 12 hours after ingestion are often associated with  toxic reactions.  Performed at [facility], [address], [place], [place] [address]    WBC 08/09/2022 10.2  4.0 - 10.5 K/uL Final   RBC 08/09/2022 4.06  3.87 - 5.11 MIL/uL Final   Hemoglobin 08/09/2022 10.1 (L)  12.0 - 15.0 g/dL Final   HCT 95/28/4132 32.4 (L)  36.0 - 46.0 % Final   MCV 08/09/2022 79.8 (L)  80.0 - 100.0 fL Final   MCH 08/09/2022 24.9 (L)  26.0 - 34.0 pg Final   MCHC 08/09/2022 31.2  30.0 - 36.0 g/dL Final   RDW 44/02/270 15.2  11.5 - 15.5 % Final   Platelets 08/09/2022 304  150 - 400 K/uL Final   nRBC 08/09/2022 0.0  0.0 - 0.2 % Final   Neutrophils Relative % 08/09/2022 67  % Final   Neutro Abs 08/09/2022 6.9  1.7 - 7.7 K/uL Final   Lymphocytes Relative 08/09/2022 26  % Final   Lymphs Abs 08/09/2022 2.6  0.7 - 4.0 K/uL Final   Monocytes Relative 08/09/2022 5  % Final   Monocytes Absolute 08/09/2022 0.6  0.1 - 1.0 K/uL Final   Eosinophils Relative 08/09/2022 1  % Final   Eosinophils Absolute 08/09/2022 0.1  0.0 - 0.5 K/uL Final   Basophils Relative 08/09/2022 1  % Final   Basophils Absolute 08/09/2022 0.1  0.0 - 0.1 K/uL Final   Immature Granulocytes 08/09/2022 0  % Final   Abs Immature Granulocytes 08/09/2022 0.03  0.00 - 0.07 K/uL Final   Performed at [facility], [address] [place], [place] [address]  Acetaminophen (Tylenol), Serum 08/09/2022 <10 (L)  10 - 30 ug/mL Final   Comment: (NOTE) Therapeutic concentrations vary significantly. A range of 10-30 ug/mL  may be an effective concentration for many patients. However, some  are best treated at concentrations outside of this range. Acetaminophen concentrations >150 ug/mL at 4 hours after ingestion  and >50 ug/mL at 12 hours after ingestion are often associated with  toxic reactions.  Performed at [facility], [address] [place], [place] [address]     Glucose-Capillary 08/09/2022 120 (H)  70 - 99 mg/dL Final   Glucose reference range applies only to samples taken after fasting for at least 8 hours.   Sodium 08/09/2022 135  135 - 145 mmol/L Final   Potassium 08/09/2022 4.2  3.5 - 5.1 mmol/L Final   Chloride 08/09/2022 105  98 - 111 mmol/L Final   CO2 08/09/2022 24  22 - 32 mmol/L Final   Glucose, Bld 08/09/2022 110 (H)  70 - 99 mg/dL Final   Glucose reference range applies only to samples taken after fasting for at least 8 hours.   BUN 08/09/2022 13  6 - 20 mg/dL Final   Creatinine, Ser 08/09/2022 0.88  0.44 - 1.00 mg/dL Final   Calcium 82/95/6213 9.0  8.9 - 10.3 mg/dL Final   GFR, Estimated 08/09/2022 >60  >60 mL/min Final   Comment: (NOTE) Calculated using the CKD-EPI Creatinine Equation (2021)    Anion gap 08/09/2022 6  5 - 15 Final   Performed at [facility], [address] [place], [place] [address]   pH, Ven 08/09/2022 7.37  7.25 - 7.43 Final   pCO2, Ven 08/09/2022 52  44 - 60 mmHg Final   pO2, Ven 08/09/2022 <31 (LL)  32 - 45 mmHg Final   Comment: CRITICAL RESULT CALLED TO, READ BACK BY AND VERIFIED WITH: FOX B. RN AT 1202 ON 08/09/2022 BY MECIAL J.    Bicarbonate 08/09/2022 30.1 (H)  20.0 - 28.0 mmol/L Final   Acid-Base Excess 08/09/2022 3.6 (H)  0.0 - 2.0 mmol/L Final   O2 Saturation 08/09/2022 38.3  % Final   Patient temperature 08/09/2022 37.0   Final   Performed at [facility], [address] [place], [place] [address]   Ammonia 08/09/2022 13  9 - 35 umol/L Final   Performed at [facility], [address] [place], [place] [address]   Magnesium 08/09/2022 1.8  1.7 - 2.4 mg/dL Final   Performed at [facility], [address], [place], [place] [address]  Admission on 08/04/2022, Discharged on 08/05/2022  Component Date Value Ref Range Status   Sodium 08/04/2022 143  135 - 145 mmol/L Final   Potassium 08/04/2022 4.1  3.5 - 5.1 mmol/L Final    Chloride 08/04/2022 108  98 - 111 mmol/L Final   CO2 08/04/2022 19 (L)  22 - 32 mmol/L Final   Glucose, Bld 08/04/2022 145 (H)  70 - 99 mg/dL Final   Glucose reference range applies only to samples taken after fasting for at least 8 hours.   BUN 08/04/2022 10  6 - 20 mg/dL Final   Creatinine, Ser 08/04/2022 0.74  0.44 - 1.00 mg/dL Final   Calcium 40/11/2723 9.3  8.9 - 10.3 mg/dL Final   GFR, Estimated 08/04/2022 >60  >60 mL/min Final   Comment: (NOTE) Calculated using the CKD-EPI Creatinine Equation (2021)    Anion gap 08/04/2022 16 (H)  5 - 15 Final  Performed at [facility], [address], [place], [place] [address]   WBC 08/04/2022 10.2  4.0 - 10.5 K/uL Final   RBC 08/04/2022 4.68  3.87 - 5.11 MIL/uL Final   Hemoglobin 08/04/2022 11.2 (L)  12.0 - 15.0 g/dL Final   HCT 60/45/4098 37.6  36.0 - 46.0 % Final   MCV 08/04/2022 80.3  80.0 - 100.0 fL Final   MCH 08/04/2022 23.9 (L)  26.0 - 34.0 pg Final   MCHC 08/04/2022 29.8 (L)  30.0 - 36.0 g/dL Final   RDW 11/91/4782 15.1  11.5 - 15.5 % Final   Platelets 08/04/2022 329  150 - 400 K/uL Final   nRBC 08/04/2022 0.0  0.0 - 0.2 % Final   Performed at [facility], [address] [place], [place] [address]   Preg, Serum 08/04/2022 NEGATIVE  NEGATIVE Final   Performed at [facility], [address] [place], [place] [address]   D-Dimer, Quant 08/04/2022 <0.27  0.00 - 0.50 ug/mL-FEU Final   Comment: (NOTE) At the manufacturer cut-off value of 0.5 g/mL FEU, this assay has a negative predictive value of 95-100%.This assay is intended for use in conjunction with a clinical pretest probability (PTP) assessment model to exclude pulmonary embolism (PE) and deep venous thrombosis (DVT) in outpatients suspected of PE or DVT. Results should be correlated with clinical presentation. Performed at [facility], [address], [place], [place] [address]    Total Protein 08/04/2022 6.7  6.5 - 8.1 g/dL Final   Albumin  62/95/2841 3.4 (L)  3.5 - 5.0 g/dL Final   AST 32/44/0102 23  15 - 41 U/L Final   ALT 08/04/2022 25  0 - 44 U/L Final   Alkaline Phosphatase 08/04/2022 65  38 - 126 U/L Final   Total Bilirubin 08/04/2022 0.4  0.3 - 1.2 mg/dL Final   Bilirubin, Direct 08/04/2022 <0.1  0.0 - 0.2 mg/dL Final   Indirect Bilirubin 08/04/2022 NOT CALCULATED  0.3 - 0.9 mg/dL Final   Performed at [facility], [address], [place], [place] [address]   Opiates 08/04/2022 NONE DETECTED  NONE DETECTED Final   Cocaine 08/04/2022 NONE DETECTED  NONE DETECTED Final   Benzodiazepines 08/04/2022 NONE DETECTED  NONE DETECTED Final   Amphetamines 08/04/2022 NONE DETECTED  NONE DETECTED Final   Tetrahydrocannabinol 08/04/2022 NONE DETECTED  NONE DETECTED Final   Barbiturates 08/04/2022 NONE DETECTED  NONE DETECTED Final   Comment: (NOTE) DRUG SCREEN FOR MEDICAL PURPOSES ONLY.  IF CONFIRMATION IS NEEDED FOR ANY PURPOSE, NOTIFY LAB WITHIN 5 DAYS.  LOWEST DETECTABLE LIMITS FOR URINE DRUG SCREEN Drug Class                     Cutoff (ng/mL) Amphetamine and metabolites    1000 Barbiturate and metabolites    200 Benzodiazepine                 200 Opiates and metabolites        300 Cocaine and metabolites        300 THC                            50 Performed at [facility], [address], [place], [place] [address]   Admission on 07/29/2022, Discharged on 08/02/2022  Component Date Value Ref Range Status   Iron 07/31/2022 50  28 - 170 ug/dL Final  TIBC 07/31/2022 409  250 - 450 ug/dL Final   Saturation Ratios 07/31/2022 12  10.4 - 31.8 % Final   UIBC 07/31/2022 359  ug/dL Final   Performed at [facility], [address], [place], [place] [address]   Ferritin 07/31/2022 7 (L)  11 - 307 ng/mL Final   Performed at [facility], [address] [place], [place] [address]  Admission on 07/29/2022, Discharged on 07/29/2022  Component Date Value Ref Range Status    Sodium 07/29/2022 137  135 - 145 mmol/L Final   Potassium 07/29/2022 3.7  3.5 - 5.1 mmol/L Final   Chloride 07/29/2022 102  98 - 111 mmol/L Final   CO2 07/29/2022 23  22 - 32 mmol/L Final   Glucose, Bld 07/29/2022 101 (H)  70 - 99 mg/dL Final   Glucose reference range applies only to samples taken after fasting for at least 8 hours.   BUN 07/29/2022 7  6 - 20 mg/dL Final   Creatinine, Ser 07/29/2022 0.76  0.44 - 1.00 mg/dL Final   Calcium 82/95/6213 9.0  8.9 - 10.3 mg/dL Final   Total Protein 08/65/7846 7.2  6.5 - 8.1 g/dL Final   Albumin 96/29/5284 3.8  3.5 - 5.0 g/dL Final   AST 13/24/4010 19  15 - 41 U/L Final   ALT 07/29/2022 16  0 - 44 U/L Final   Alkaline Phosphatase 07/29/2022 75  38 - 126 U/L Final   Total Bilirubin 07/29/2022 0.3  0.3 - 1.2 mg/dL Final   GFR, Estimated 07/29/2022 >60  >60 mL/min Final   Comment: (NOTE) Calculated using the CKD-EPI Creatinine Equation (2021)    Anion gap 07/29/2022 12  5 - 15 Final   Performed at [name], [address] [place], [place] [address]   Alcohol, Ethyl (B) 07/29/2022 <10  <10 mg/dL Final   Comment: (NOTE) Lowest detectable limit for serum alcohol is 10 mg/dL.  For medical purposes only. Performed at [facility], [address] [place], [place] [address]    Salicylate Lvl 07/29/2022 <7.0 (L)  7.0 - 30.0 mg/dL Final   Performed at [facility], [address] [place], [place] [address]   Acetaminophen (Tylenol), Serum 07/29/2022 <10 (L)  10 - 30 ug/mL Final   Comment: (NOTE) Therapeutic concentrations vary significantly. A range of 10-30 ug/mL  may be an effective concentration for many patients. However, some  are best treated at concentrations outside of this range. Acetaminophen concentrations >150 ug/mL at 4 hours after ingestion  and >50 ug/mL at 12 hours after ingestion are often associated with  toxic reactions.  Performed at [facility], [address], [place], [place] [address]     WBC 07/29/2022 10.8 (H)  4.0 - 10.5 K/uL Final   RBC 07/29/2022 4.52  3.87 - 5.11 MIL/uL Final   Hemoglobin 07/29/2022 11.0 (L)  12.0 - 15.0 g/dL Final   HCT 87/56/4332 35.9 (L)  36.0 - 46.0 % Final   MCV 07/29/2022 79.4 (L)  80.0 - 100.0 fL Final   MCH 07/29/2022 24.3 (L)  26.0 - 34.0 pg Final   MCHC 07/29/2022 30.6  30.0 - 36.0 g/dL Final   RDW 95/18/8416 14.6  11.5 - 15.5 % Final   Platelets 07/29/2022 288  150 - 400 K/uL Final   nRBC 07/29/2022 0.0  0.0 - 0.2 % Final   Performed at [facility], [address], [place], [place]  [address]   Opiates 07/29/2022 NONE DETECTED  NONE DETECTED Final   Cocaine 07/29/2022 NONE DETECTED  NONE DETECTED Final   Benzodiazepines 07/29/2022 NONE DETECTED  NONE DETECTED Final   Amphetamines 07/29/2022 NONE DETECTED  NONE DETECTED Final   Tetrahydrocannabinol 07/29/2022 NONE DETECTED  NONE DETECTED Final   Barbiturates 07/29/2022 NONE DETECTED  NONE DETECTED Final   Comment: (NOTE) DRUG SCREEN FOR MEDICAL PURPOSES ONLY.  IF CONFIRMATION IS NEEDED FOR ANY PURPOSE, NOTIFY LAB WITHIN 5 DAYS.  LOWEST DETECTABLE LIMITS FOR URINE DRUG SCREEN Drug Class                     Cutoff (ng/mL) Amphetamine and metabolites    1000 Barbiturate and metabolites    200 Benzodiazepine                 200 Opiates and metabolites        300 Cocaine and metabolites        300 THC                            50 Performed at [facility], [address], [place], [place] [address]    Preg Test, Ur 07/29/2022 NEGATIVE  NEGATIVE Final   Comment:        THE SENSITIVITY OF THIS METHODOLOGY IS >25 mIU/mL. Performed at [facility], [address], [place], [place] [address]   Admission on 07/29/2022, Discharged on 07/29/2022  Component Date Value Ref Range Status   WBC 07/29/2022 8.8  4.0 - 10.5 K/uL Final   RBC 07/29/2022 4.37  3.87 - 5.11 MIL/uL Final   Hemoglobin 07/29/2022 10.6 (L)  12.0 - 15.0 g/dL Final   HCT 91/47/8295 34.8 (L)  36.0 -  46.0 % Final   MCV 07/29/2022 79.6 (L)  80.0 - 100.0 fL Final   MCH 07/29/2022 24.3 (L)  26.0 - 34.0 pg Final   MCHC 07/29/2022 30.5  30.0 - 36.0 g/dL Final   RDW 62/13/0865 14.4  11.5 - 15.5 % Final   Platelets 07/29/2022 260  150 - 400 K/uL Final   nRBC 07/29/2022 0.0  0.0 - 0.2 % Final   Neutrophils Relative % 07/29/2022 59  % Final   Neutro Abs 07/29/2022 5.3  1.7 - 7.7 K/uL Final   Lymphocytes Relative 07/29/2022 31  % Final   Lymphs Abs 07/29/2022 2.7  0.7 - 4.0 K/uL Final   Monocytes Relative 07/29/2022 7  % Final   Monocytes Absolute 07/29/2022 0.6  0.1 - 1.0 K/uL Final   Eosinophils Relative 07/29/2022 1  % Final   Eosinophils Absolute 07/29/2022 0.1  0.0 - 0.5 K/uL Final   Basophils Relative 07/29/2022 1  % Final   Basophils Absolute 07/29/2022 0.1  0.0 - 0.1 K/uL Final   Immature Granulocytes 07/29/2022 1  % Final   Abs Immature Granulocytes 07/29/2022 0.04  0.00 - 0.07 K/uL Final   Performed at [facility], [address], [place], [place] [address]   Sodium 07/29/2022 136  135 - 145 mmol/L Final   Potassium 07/29/2022 3.9  3.5 - 5.1 mmol/L Final   Chloride 07/29/2022 104  98 - 111 mmol/L Final   CO2 07/29/2022 23  22 - 32 mmol/L Final   Glucose, Bld 07/29/2022 102 (H)  70 - 99 mg/dL Final   Glucose reference range applies only to samples taken after fasting for at least 8  hours.   BUN 07/29/2022 7  6 - 20 mg/dL Final   Creatinine, Ser 07/29/2022 0.74  0.44 - 1.00 mg/dL Final   Calcium 02/07/7251 8.8 (L)  8.9 - 10.3 mg/dL Final   GFR, Estimated 07/29/2022 >60  >60 mL/min Final   Comment: (NOTE) Calculated using the CKD-EPI Creatinine Equation (2021)    Anion gap 07/29/2022 9  5 - 15 Final   Performed at [facility], [address], [place], [place] [address]   Troponin I (High Sensitivity) 07/29/2022 3  <18 ng/L Final   Comment: (NOTE) Elevated high sensitivity troponin I (hsTnI) values and significant  changes across serial measurements may suggest ACS  but many other  chronic and acute conditions are known to elevate hsTnI results.  Refer to the "Links" section for chest pain algorithms and additional  guidance. Performed at [facility], [address] [place], [place] [address]   Admission on 07/13/2022, Discharged on 07/14/2022  Component Date Value Ref Range Status   Sodium 07/13/2022 136  135 - 145 mmol/L Final   Potassium 07/13/2022 3.6  3.5 - 5.1 mmol/L Final   Chloride 07/13/2022 105  98 - 111 mmol/L Final   CO2 07/13/2022 22  22 - 32 mmol/L Final   Glucose, Bld 07/13/2022 133 (H)  70 - 99 mg/dL Final   Glucose reference range applies only to samples taken after fasting for at least 8 hours.   BUN 07/13/2022 7  6 - 20 mg/dL Final   Creatinine, Ser 07/13/2022 0.73  0.44 - 1.00 mg/dL Final   Calcium 59/56/3875 8.8 (L)  8.9 - 10.3 mg/dL Final   Total Protein 64/33/2951 7.0  6.5 - 8.1 g/dL Final   Albumin 88/41/6606 3.6  3.5 - 5.0 g/dL Final   AST 30/16/0109 18  15 - 41 U/L Final   ALT 07/13/2022 14  0 - 44 U/L Final   Alkaline Phosphatase 07/13/2022 61  38 - 126 U/L Final   Total Bilirubin 07/13/2022 0.2 (L)  0.3 - 1.2 mg/dL Final   GFR, Estimated 07/13/2022 >60  >60 mL/min Final   Comment: (NOTE) Calculated using the CKD-EPI Creatinine Equation (2021)    Anion gap 07/13/2022 9  5 - 15 Final   Performed at [facility], [address] [place], [place] [address]   Alcohol, Ethyl (B) 07/13/2022 <10  <10 mg/dL Final   Comment: (NOTE) Lowest detectable limit for serum alcohol is 10 mg/dL.  For medical purposes only. Performed at [facility], [address], [place], [place] [address]    Salicylate Lvl 07/13/2022 <7.0 (L)  7.0 - 30.0 mg/dL Final   Performed at [facility], [address] [place], [place] [address]   Acetaminophen (Tylenol), Serum 07/13/2022 <10 (L)  10 - 30 ug/mL Final   Comment: (NOTE) Therapeutic concentrations vary significantly. A  range of 10-30 ug/mL  may be an effective concentration for many patients. However, some  are best treated at concentrations outside of this range. Acetaminophen concentrations >150 ug/mL at 4 hours after ingestion  and >50 ug/mL at 12 hours after ingestion are often associated with  toxic reactions.  Performed at [facility], [address], [place], [place] [address]    WBC 07/13/2022 9.8  4.0 - 10.5 K/uL Final   RBC 07/13/2022 4.33  3.87 - 5.11 MIL/uL Final   Hemoglobin 07/13/2022 10.7 (L)  12.0 - 15.0 g/dL Final   HCT 62/83/1517 34.7 (  L)  36.0 - 46.0 % Final   MCV 07/13/2022 80.1  80.0 - 100.0 fL Final   MCH 07/13/2022 24.7 (L)  26.0 - 34.0 pg Final   MCHC 07/13/2022 30.8  30.0 - 36.0 g/dL Final   RDW 56/38/7564 14.3  11.5 - 15.5 % Final   Platelets 07/13/2022 278  150 - 400 K/uL Final   nRBC 07/13/2022 0.0  0.0 - 0.2 % Final   Performed at [facility], [address] [place], [place] [address]   Opiates 07/13/2022 NONE DETECTED  NONE DETECTED Final   Cocaine 07/13/2022 NONE DETECTED  NONE DETECTED Final   Benzodiazepines 07/13/2022 NONE DETECTED  NONE DETECTED Final   Amphetamines 07/13/2022 NONE DETECTED  NONE DETECTED Final   Tetrahydrocannabinol 07/13/2022 NONE DETECTED  NONE DETECTED Final   Barbiturates 07/13/2022 NONE DETECTED  NONE DETECTED Final   Comment: (NOTE) DRUG SCREEN FOR MEDICAL PURPOSES ONLY.  IF CONFIRMATION IS NEEDED FOR ANY PURPOSE, NOTIFY LAB WITHIN 5 DAYS.  LOWEST DETECTABLE LIMITS FOR URINE DRUG SCREEN Drug Class                     Cutoff (ng/mL) Amphetamine and metabolites    1000 Barbiturate and metabolites    200 Benzodiazepine                 200 Opiates and metabolites        300 Cocaine and metabolites        300 THC                            50 Performed at [facility], [address] [place], [place] [address]    I-stat hCG, quantitative 07/13/2022 <5.0  <5 mIU/mL Final    Comment 3 07/13/2022          Final   Comment:   GEST. AGE      CONC.  (mIU/mL)   <=1 WEEK        5 - 50     2 WEEKS       50 - 500     3 WEEKS       100 - 10,000     4 WEEKS     1,000 - 30,000        FEMALE AND NON-PREGNANT FEMALE:     LESS THAN 5 mIU/mL    Acetaminophen (Tylenol), Serum 07/14/2022 <10 (L)  10 - 30 ug/mL Final   Comment: (NOTE) Therapeutic concentrations vary significantly. A range of 10-30 ug/mL  may be an effective concentration for many patients. However, some  are best treated at concentrations outside of this range. Acetaminophen concentrations >150 ug/mL at 4 hours after ingestion  and >50 ug/mL at 12 hours after ingestion are often associated with  toxic reactions.  Performed at [facility], [address], [place], [place] [address]   Admission on 07/06/2022, Discharged on 07/06/2022  Component Date Value Ref Range Status   Preg Test, Ur 07/06/2022 Negative  Negative Final   POC Amphetamine UR 07/06/2022 None Detected  NONE DETECTED (Cut Off Level 1000 ng/mL) Final   POC Secobarbital (BAR) 07/06/2022 None Detected  NONE DETECTED (Cut Off Level 300 ng/mL) Final   POC Buprenorphine (BUP) 07/06/2022 None Detected  NONE DETECTED (Cut Off Level 10 ng/mL) Final   POC Oxazepam (BZO) 07/06/2022 Positive (A)  NONE DETECTED (Cut Off Level 300 ng/mL) Final  POC Cocaine UR 07/06/2022 None Detected  NONE DETECTED (Cut Off Level 300 ng/mL) Final   POC Methamphetamine UR 07/06/2022 None Detected  NONE DETECTED (Cut Off Level 1000 ng/mL) Final   POC Morphine 07/06/2022 None Detected  NONE DETECTED (Cut Off Level 300 ng/mL) Final   POC Methadone UR 07/06/2022 None Detected  NONE DETECTED (Cut Off Level 300 ng/mL) Final   POC Oxycodone UR 07/06/2022 None Detected  NONE DETECTED (Cut Off Level 100 ng/mL) Final   POC Marijuana UR 07/06/2022 None Detected  NONE DETECTED (Cut Off Level 50 ng/mL) Final   Preg Test, Ur 07/06/2022 NEGATIVE  NEGATIVE Final    Comment:        THE SENSITIVITY OF THIS METHODOLOGY IS >24 mIU/mL    Sodium 07/06/2022 133 (L)  135 - 145 mmol/L Final   Potassium 07/06/2022 3.2 (L)  3.5 - 5.1 mmol/L Final   Chloride 07/06/2022 104  98 - 111 mmol/L Final   CO2 07/06/2022 23  22 - 32 mmol/L Final   Glucose, Bld 07/06/2022 101 (H)  70 - 99 mg/dL Final   Glucose reference range applies only to samples taken after fasting for at least 8 hours.   BUN 07/06/2022 6  6 - 20 mg/dL Final   Creatinine, Ser 07/06/2022 0.74  0.44 - 1.00 mg/dL Final   Calcium 62/13/0865 8.9  8.9 - 10.3 mg/dL Final   Total Protein 78/46/9629 6.8  6.5 - 8.1 g/dL Final   Albumin 52/84/1324 3.8  3.5 - 5.0 g/dL Final   AST 40/11/2723 15  15 - 41 U/L Final   ALT 07/06/2022 14  0 - 44 U/L Final   Alkaline Phosphatase 07/06/2022 71  38 - 126 U/L Final   Total Bilirubin 07/06/2022 0.4  0.3 - 1.2 mg/dL Final   GFR, Estimated 07/06/2022 >60  >60 mL/min Final   Comment: (NOTE) Calculated using the CKD-EPI Creatinine Equation (2021)    Anion gap 07/06/2022 6  5 - 15 Final   Performed at [facility], [address], [place], [place] [address]   WBC 07/06/2022 7.2  4.0 - 10.5 K/uL Final   RBC 07/06/2022 4.65  3.87 - 5.11 MIL/uL Final   Hemoglobin 07/06/2022 11.8 (L)  12.0 - 15.0 g/dL Final   HCT 03/47/4259 37.3  36.0 - 46.0 % Final   MCV 07/06/2022 80.2  80.0 - 100.0 fL Final   MCH 07/06/2022 25.4 (L)  26.0 - 34.0 pg Final   MCHC 07/06/2022 31.6  30.0 - 36.0 g/dL Final   RDW 56/38/7564 14.4  11.5 - 15.5 % Final   Platelets 07/06/2022 274  150 - 400 K/uL Final   nRBC 07/06/2022 0.0  0.0 - 0.2 % Final   Neutrophils Relative % 07/06/2022 60  % Final   Neutro Abs 07/06/2022 4.3  1.7 - 7.7 K/uL Final   Lymphocytes Relative 07/06/2022 32  % Final   Lymphs Abs 07/06/2022 2.3  0.7 - 4.0 K/uL Final   Monocytes Relative 07/06/2022 7  % Final   Monocytes Absolute 07/06/2022 0.5  0.1 - 1.0 K/uL Final   Eosinophils Relative 07/06/2022 0  % Final    Eosinophils Absolute 07/06/2022 0.0  0.0 - 0.5 K/uL Final   Basophils Relative 07/06/2022 1  % Final   Basophils Absolute 07/06/2022 0.0  0.0 - 0.1 K/uL Final   Immature Granulocytes 07/06/2022 0  % Final   Abs Immature Granulocytes 07/06/2022 0.02  0.00 - 0.07 K/uL Final   Performed  at [facility], [address] [place], [place] [address]  Admission on 06/22/2022, Discharged on 06/23/2022  Component Date Value Ref Range Status   WBC 06/22/2022 8.1  4.0 - 10.5 K/uL Final   RBC 06/22/2022 4.45  3.87 - 5.11 MIL/uL Final   Hemoglobin 06/22/2022 10.8 (L)  12.0 - 15.0 g/dL Final   HCT 60/45/4098 34.7 (L)  36.0 - 46.0 % Final   MCV 06/22/2022 78.0 (L)  80.0 - 100.0 fL Final   MCH 06/22/2022 24.3 (L)  26.0 - 34.0 pg Final   MCHC 06/22/2022 31.1  30.0 - 36.0 g/dL Final   RDW 11/91/4782 14.4  11.5 - 15.5 % Final   Platelets 06/22/2022 299  150 - 400 K/uL Final   nRBC 06/22/2022 0.0  0.0 - 0.2 % Final   Neutrophils Relative % 06/22/2022 63  % Final   Neutro Abs 06/22/2022 5.1  1.7 - 7.7 K/uL Final   Lymphocytes Relative 06/22/2022 30  % Final   Lymphs Abs 06/22/2022 2.4  0.7 - 4.0 K/uL Final   Monocytes Relative 06/22/2022 6  % Final   Monocytes Absolute 06/22/2022 0.5  0.1 - 1.0 K/uL Final   Eosinophils Relative 06/22/2022 1  % Final   Eosinophils Absolute 06/22/2022 0.1  0.0 - 0.5 K/uL Final   Basophils Relative 06/22/2022 0  % Final   Basophils Absolute 06/22/2022 0.0  0.0 - 0.1 K/uL Final   Immature Granulocytes 06/22/2022 0  % Final   Abs Immature Granulocytes 06/22/2022 0.03  0.00 - 0.07 K/uL Final   Performed at [facility], [address] [place], [place] [address]   Sodium 06/22/2022 138  135 - 145 mmol/L Final   Potassium 06/22/2022 3.6  3.5 - 5.1 mmol/L Final   Chloride 06/22/2022 104  98 - 111 mmol/L Final   CO2 06/22/2022 22  22 - 32 mmol/L Final   Glucose, Bld 06/22/2022 93  70 - 99 mg/dL Final   Glucose reference range applies only to samples taken after  fasting for at least 8 hours.   BUN 06/22/2022 5 (L)  6 - 20 mg/dL Final   Creatinine, Ser 06/22/2022 0.74  0.44 - 1.00 mg/dL Final   Calcium 30/86/5784 9.2  8.9 - 10.3 mg/dL Final   Total Protein 69/62/9528 6.6  6.5 - 8.1 g/dL Final   Albumin 41/32/4401 3.9  3.5 - 5.0 g/dL Final   AST 02/72/5366 16  15 - 41 U/L Final   ALT 06/22/2022 16  0 - 44 U/L Final   Alkaline Phosphatase 06/22/2022 64  38 - 126 U/L Final   Total Bilirubin 06/22/2022 0.3  0.3 - 1.2 mg/dL Final   GFR, Estimated 06/22/2022 >60  >60 mL/min Final   Comment: (NOTE) Calculated using the CKD-EPI Creatinine Equation (2021)    Anion gap 06/22/2022 12  5 - 15 Final   Performed at [facility], [address] [place], [place] [address]   Alcohol, Ethyl (B) 06/22/2022 <10  <10 mg/dL Final   Comment: (NOTE) Lowest detectable limit for serum alcohol is 10 mg/dL.  For medical purposes only. Performed at [facility], [address] [place], [place] [address]    Preg Test, Ur 06/22/2022 Negative  Negative Final   POC Amphetamine UR 06/22/2022 None Detected  NONE DETECTED (Cut Off Level 1000 ng/mL) Final   POC Secobarbital (BAR) 06/22/2022 None Detected  NONE DETECTED (Cut Off Level 300 ng/mL) Final   POC Buprenorphine (  BUP) 06/22/2022 None Detected  NONE DETECTED (Cut Off Level 10 ng/mL) Final   POC Oxazepam (BZO) 06/22/2022 Positive (A)  NONE DETECTED (Cut Off Level 300 ng/mL) Final   POC Cocaine UR 06/22/2022 None Detected  NONE DETECTED (Cut Off Level 300 ng/mL) Final   POC Methamphetamine UR 06/22/2022 None Detected  NONE DETECTED (Cut Off Level 1000 ng/mL) Final   POC Morphine 06/22/2022 None Detected  NONE DETECTED (Cut Off Level 300 ng/mL) Final   POC Methadone UR 06/22/2022 None Detected  NONE DETECTED (Cut Off Level 300 ng/mL) Final   POC Oxycodone UR 06/22/2022 None Detected  NONE DETECTED (Cut Off Level 100 ng/mL) Final   POC Marijuana UR 06/22/2022 None Detected  NONE DETECTED (Cut Off Level 50  ng/mL) Final  Admission on 06/16/2022, Discharged on 06/18/2022  Component Date Value Ref Range Status   Sodium 06/16/2022 139  135 - 145 mmol/L Final   Potassium 06/16/2022 3.5  3.5 - 5.1 mmol/L Final   Chloride 06/16/2022 109  98 - 111 mmol/L Final   CO2 06/16/2022 21 (L)  22 - 32 mmol/L Final   Glucose, Bld 06/16/2022 90  70 - 99 mg/dL Final   Glucose reference range applies only to samples taken after fasting for at least 8 hours.   BUN 06/16/2022 10  6 - 20 mg/dL Final   Creatinine, Ser 06/16/2022 0.71  0.44 - 1.00 mg/dL Final   Calcium 14/78/2956 8.7 (L)  8.9 - 10.3 mg/dL Final   Total Protein 21/30/8657 6.8  6.5 - 8.1 g/dL Final   Albumin 84/69/6295 3.7  3.5 - 5.0 g/dL Final   AST 28/41/3244 18  15 - 41 U/L Final   ALT 06/16/2022 18  0 - 44 U/L Final   Alkaline Phosphatase 06/16/2022 59  38 - 126 U/L Final   Total Bilirubin 06/16/2022 0.4  0.3 - 1.2 mg/dL Final   GFR, Estimated 06/16/2022 >60  >60 mL/min Final   Comment: (NOTE) Calculated using the CKD-EPI Creatinine Equation (2021)    Anion gap 06/16/2022 9  5 - 15 Final   Performed at [facility], [address], [place], [place] [address]   Alcohol, Ethyl (B) 06/16/2022 <10  <10 mg/dL Final   Comment: (NOTE) Lowest detectable limit for serum alcohol is 10 mg/dL.  For medical purposes only. Performed at [facility], [address], [place], [place] [address]    Salicylate Lvl 06/16/2022 <7.0 (L)  7.0 - 30.0 mg/dL Final   Performed at [facility], [address] [place], [place] [address]   Acetaminophen (Tylenol), Serum 06/16/2022 <10 (L)  10 - 30 ug/mL Final   Comment: (NOTE) Therapeutic concentrations vary significantly. A range of 10-30 ug/mL  may be an effective concentration for many patients. However, some  are best treated at concentrations outside of this range. Acetaminophen concentrations >150 ug/mL at 4 hours after ingestion  and >50 ug/mL at 12  hours after ingestion are often associated with  toxic reactions.  Performed at [facility], [address], [place], [place] [address]    WBC 06/16/2022 10.6 (H)  4.0 - 10.5 K/uL Final   RBC 06/16/2022 4.14  3.87 - 5.11 MIL/uL Final   Hemoglobin 06/16/2022 10.3 (L)  12.0 - 15.0 g/dL Final   HCT 56/38/7564 33.5 (L)  36.0 - 46.0 % Final   MCV 06/16/2022 80.9  80.0 - 100.0 fL Final   MCH 06/16/2022 24.9 (L)  26.0 - 34.0  pg Final   MCHC 06/16/2022 30.7  30.0 - 36.0 g/dL Final   RDW 16/11/9602 14.4  11.5 - 15.5 % Final   Platelets 06/16/2022 276  150 - 400 K/uL Final   nRBC 06/16/2022 0.0  0.0 - 0.2 % Final   Performed at [facility], [address] [place], [place] [address]   Opiates 06/17/2022 NONE DETECTED  NONE DETECTED Final   Cocaine 06/17/2022 NONE DETECTED  NONE DETECTED Final   Benzodiazepines 06/17/2022 NONE DETECTED  NONE DETECTED Final   Amphetamines 06/17/2022 NONE DETECTED  NONE DETECTED Final   Tetrahydrocannabinol 06/17/2022 NONE DETECTED  NONE DETECTED Final   Barbiturates 06/17/2022 NONE DETECTED  NONE DETECTED Final   Comment: (NOTE) DRUG SCREEN FOR MEDICAL PURPOSES ONLY.  IF CONFIRMATION IS NEEDED FOR ANY PURPOSE, NOTIFY LAB WITHIN 5 DAYS.  LOWEST DETECTABLE LIMITS FOR URINE DRUG SCREEN Drug Class                     Cutoff (ng/mL) Amphetamine and metabolites    1000 Barbiturate and metabolites    200 Benzodiazepine                 200 Opiates and metabolites        300 Cocaine and metabolites        300 THC                            50 Performed at [facility], [address] [place], [place] [address]    I-stat hCG, quantitative 06/16/2022 <5.0  <5 mIU/mL Final   Comment 3 06/16/2022          Final   Comment:   GEST. AGE      CONC.  (mIU/mL)   <=1 WEEK        5 - 50     2 WEEKS       50 - 500     3 WEEKS       100 - 10,000     4 WEEKS     1,000 - 30,000        FEMALE AND NON-PREGNANT  FEMALE:     LESS THAN 5 mIU/mL   There may be more visits with results that are not included.    Allergies: Bee venom, Coconut flavor, Fish allergy, Geodon [ziprasidone hcl], Haloperidol and related, Lithobid [lithium], Roxicodone [oxycodone], Seroquel [quetiapine], Shellfish allergy, Phenergan [promethazine hcl], Prilosec [omeprazole], Sulfa antibiotics, Tegretol [carbamazepine], Prozac [fluoxetine], Tape, and Tylenol [acetaminophen]  Medications:  Facility Ordered Medications  Medication   alum & mag hydroxide-simeth (MAALOX/MYLANTA) 200-200-20 MG/5ML suspension 30 mL   magnesium hydroxide (MILK OF MAGNESIA) suspension 30 mL   hydrOXYzine (ATARAX) tablet 25 mg   cyclobenzaprine (FLEXERIL) tablet 10 mg   gabapentin (NEURONTIN) capsule 600 mg   prazosin (MINIPRESS) capsule 1 mg   OLANZapine (ZYPREXA) tablet 10 mg   ibuprofen (ADVIL) tablet 400 mg   [START ON 08/21/2022] metoprolol tartrate (LOPRESSOR) tablet 12.5 mg   PTA Medications  Medication Sig   albuterol (VENTOLIN HFA) 108 (90 Base) MCG/ACT inhaler Inhale 2 puffs into the lungs every 6 (six) hours as needed for wheezing or shortness of breath.   BREYNA 160-4.5 MCG/ACT inhaler Inhale 2 puffs into the lungs in the morning and at bedtime.   cyclobenzaprine (FLEXERIL) 10 MG tablet Take 10 mg by mouth at bedtime.   diclofenac Sodium (VOLTAREN) 1 %  GEL Apply 2 g topically 2 (two) times daily. (Patient not taking: Reported on 08/09/2022)   hydrOXYzine (ATARAX) 25 MG tablet Take 1 tablet (25 mg total) by mouth 3 (three) times daily as needed for anxiety.   pantoprazole (PROTONIX) 40 MG tablet Take 40 mg by mouth daily before breakfast.   prazosin (MINIPRESS) 1 MG capsule Take 1 mg by mouth at bedtime.   escitalopram (LEXAPRO) 20 MG tablet Take 1 tablet (20 mg total) by mouth daily.   OLANZapine zydis (ZYPREXA) 10 MG disintegrating tablet Take 1 tablet (10 mg total) by mouth at bedtime. (Patient not taking: Reported on 08/09/2022)    gabapentin (NEURONTIN) 600 MG tablet Take 600 mg by mouth 3 (three) times daily.   melatonin 3 MG TABS tablet Take 1 tablet (3 mg total) by mouth at bedtime. (Patient not taking: Reported on 08/09/2022)   metoprolol tartrate (LOPRESSOR) 25 MG tablet Take 0.5 tablets (12.5 mg total) by mouth 2 (two) times daily for 20 days.   OLANZapine (ZYPREXA) 10 MG tablet Take 10 mg by mouth at bedtime.   ibuprofen (ADVIL) 400 MG tablet Take 400 mg by mouth every 6 (six) hours as needed for mild pain.      Medical Decision Making  Recommend inpatient psychiatric admission for stabilization and treatment.  Discussed admission to the continuous observation unit for safety monitoring pending transfer to inpatient psychiatric unit.  Patient is in agreement.  Lab Orders         CBC with Differential/Platelet         Comprehensive metabolic panel         POC urine preg, ED         POCT Urine Drug Screen - (I-Screen)         Pregnancy, urine POC      Home medications reordered -Flexeril 10 mg p.o. nightly for fibromyalgia syndrome -Neurontin 600 mg p.o. 3 times daily for GAD -Advil 400 mg p.o. every 6 hours as needed pain -Metoprolol tartrate 12.5 mg p.o. daily for SVT -Zyprexa 10 mg p.o. nightly for mood stabilization -Minipress 1 mg p.o. nightly for nightmares  Other PRNs -Maalox 30 mL p.o. every 4 hours as needed indigestion -Atarax 25 mg p.o. 3 times daily as needed anxiety -MOM 30 mL p.o. daily as needed constipation   Recommendations  Based on my evaluation the patient does not appear to have an emergency medical condition.  Recommend inpatient psychiatric admission for stabilization and treatment.  Mancel Bale, NP 08/20/22  9:56 PM

## 2022-08-21 DIAGNOSIS — F439 Reaction to severe stress, unspecified: Secondary | ICD-10-CM | POA: Diagnosis not present

## 2022-08-21 DIAGNOSIS — R45851 Suicidal ideations: Secondary | ICD-10-CM | POA: Diagnosis not present

## 2022-08-21 DIAGNOSIS — F251 Schizoaffective disorder, depressive type: Secondary | ICD-10-CM | POA: Diagnosis not present

## 2022-08-21 MED ORDER — LORAZEPAM 1 MG PO TABS
1.0000 mg | ORAL_TABLET | ORAL | Status: AC | PRN
  Administered 2022-08-21: 1 mg via ORAL
  Filled 2022-08-21: qty 1

## 2022-08-21 MED ORDER — PANTOPRAZOLE SODIUM 40 MG PO TBEC: 40.0000 mg | DELAYED_RELEASE_TABLET | Freq: Every day | ORAL | Status: DC

## 2022-08-21 MED ORDER — LORAZEPAM 2 MG/ML IJ SOLN: 1.0000 mg | Freq: Once | INTRAMUSCULAR | Status: DC

## 2022-08-21 MED ORDER — ESCITALOPRAM OXALATE 10 MG PO TABS
20.0000 mg | ORAL_TABLET | Freq: Every day | ORAL | Status: DC
  Administered 2022-08-21 – 2022-08-22 (×2): 20 mg via ORAL
  Filled 2022-08-21 (×2): qty 2

## 2022-08-21 MED ORDER — POLYETHYLENE GLYCOL 3350 17 G PO PACK
17.0000 g | PACK | Freq: Every day | ORAL | Status: DC | PRN
  Administered 2022-08-21: 17 g via ORAL
  Filled 2022-08-21: qty 1

## 2022-08-21 MED ORDER — OLANZAPINE 10 MG PO TBDP: 10.0000 mg | ORAL_TABLET | Freq: Three times a day (TID) | ORAL | Status: DC | PRN

## 2022-08-21 MED ORDER — ZIPRASIDONE MESYLATE 20 MG IM SOLR: 20.0000 mg | INTRAMUSCULAR | Status: DC | PRN

## 2022-08-21 MED ORDER — PANTOPRAZOLE SODIUM 40 MG PO TBEC
40.0000 mg | DELAYED_RELEASE_TABLET | Freq: Every day | ORAL | Status: DC
  Administered 2022-08-21 – 2022-08-22 (×2): 40 mg via ORAL
  Filled 2022-08-21 (×3): qty 1

## 2022-08-21 NOTE — ED Notes (Addendum)
Patient is stating that she is ready to go. She said that everyone who was here before her have already left so its not fair for her to still be here. Nurse Michail Sermon notifed

## 2022-08-21 NOTE — ED Notes (Addendum)
Pt is starting to hit her hand on her bed, after being told to stop. Repeatedly says that she wants to break it.

## 2022-08-21 NOTE — ED Notes (Signed)
Pt sleeping@this time. Breathing even and unlabored. Will continue to monitor for safety 

## 2022-08-21 NOTE — ED Notes (Addendum)
Pt stating she feels like she wants to hang herself or scratch all her nails off. Nurse Michail Sermon notifed

## 2022-08-21 NOTE — ED Notes (Signed)
Patient is visibly upset and banging her hand on the wall and bed. Patient said aloud to staff that she wants to kill everyone. The patient was able to be redirected.

## 2022-08-21 NOTE — ED Notes (Signed)
Patient is still complaining of chest pain at a level 6. Providers have been made aware and orders are being placed for EKG.

## 2022-08-21 NOTE — ED Notes (Signed)
Pt was hitting her hand and head against the wall she was able to be re directed she is upset that eveyone is leaving but her we explained she will be reassessed in morning she was given something to eat and she is not calm eating and sitting on her bed

## 2022-08-21 NOTE — ED Notes (Signed)
Patient is sleeping. Respirations equal and unlabored, skin warm and dry. No change in assessment or acuity. Routine safety checks conducted according to facility protocol. Will continue to monitor for safety.   

## 2022-08-21 NOTE — ED Notes (Addendum)
Spoke with patient , she has complaint of chest pain 6/10 and states it feels like a pressure type pain. Advised patient of medication she has on file.

## 2022-08-21 NOTE — ED Notes (Addendum)
Patient complaint of aggravation and wanting to go home. Patient has been made aware.

## 2022-08-21 NOTE — ED Notes (Signed)
Patient reports she is suicidal. Patient denies HI and AVH. Patient reports if she goes home she will stab herself. Patient stated " I came here so I would not hurt myself. 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 Patient is being monitored for safety.

## 2022-08-21 NOTE — ED Notes (Addendum)
Patient is sleeping. Respirations equal and unlabored, skin warm and dry. No change in assessment or acuity. Routine safety checks conducted according to facility protocol. Will continue to monitor for safety.   

## 2022-08-21 NOTE — ED Notes (Signed)
Pt is beating her fist against bed and stating "I am going to kill you all" and "I am going to blow your brains out" will continue to monitor for safety.

## 2022-08-21 NOTE — ED Notes (Signed)
Patient resting quietly in bed with eyes closed. Respirations equal and unlabored, skin warm and dry, NAD. Routine safety checks conducted according to facility protocol. Will continue to monitor for safety.  

## 2022-08-21 NOTE — ED Notes (Signed)
Pt is currently banging her head against the wall, writer asked pt to "stop" and pt replied "no" will continue to monitor for safety.

## 2022-08-21 NOTE — ED Provider Notes (Signed)
Behavioral Health Progress Note  Date and Time: 08/21/2022 1:01 PM Name: [name] MRN:  [id number]   HPI on admission [name] NP: "[name] is a 32 year old female with psychiatric history of schizoaffective disorder mixed episodes, intentional drug overdose, ineffective individual coping, adjustment disorder with mixed anxiety and depressed mood, suicidal behavior, suicide attempt, paranoia, adjustment disorder with mixed disturbance of emotions and conduct, autism, drug seeking behavior, personality disorder, and sleep apnea who presented voluntarily to [facility] via [facility] due to suicidal ideation with several plans including overdosing on pills, stabbing herself in the heart with a knife, pulling her anger "up there", while pointing to her private area, and banging my head on the wall".  Per chart review, patient is well-known to the behavioral health service line, and has made 34 ED/urgent care visits with different complaints within the past 6 months. Patient also has history of several inpatient psychiatric hospitalizations, with the most recent at an area hospital last month and was last admitted at the [facility] 07/29/22 for suicidal ideation".  Patient seen face to face by this provider, chart reviewed  and case consulted with Dr. Lucianne Muss on 08/21/2022  Subjective:    On today's evaluation [name] is observed laying in her bed asleep.  She is easily awakened.  She is disheveled and makes fair eye contact.  She is alert/oriented x 4, cooperative, and fairly attentive.She did not sleep well on the unit last night.  She continues to endorse depression with feelings of hopelessness and worthlessness.  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  She was told that her heart is in bad shape due to her previous overdoses.  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  She is endorsing suicidal ideations with a plan to stab herself in the chest or take pills.  However she did not contradicts herself by saying, "I would not do it because I do not want to make my heart any worse".  She is not contracting for safety at this time.  States, "I think I need to go to [place] or over to behavioral health, they help me the most".  She is requesting to be recommended for inpatient admission.  She denies HI. She endorses auditory hallucinations of hearing her own voice that tells her to kill herself.  She denies any visual hallucinations.  She does not appear to be responding to internal stimuli.  She does not appear psychotic or manic.  Patient is well-educated on her medications.  She noticed that her Protonix nor her Lexapro was restarted.  She is requesting that these be restarted at this time.  Patient will remain in the continuous assessment unit for overnight observation.  She will be reevaluated by psychiatry in the a.m.    Diagnosis:  Final diagnoses:  Suicidal ideation  Ineffective individual coping  Schizoaffective disorder, depressive type (HCC)    Total Time spent with patient: 20 minutes  Past Psychiatric History: see H&P Past Medical History: see H&P Family History: see H&P Family Psychiatric  History: see H&P Social History: see H&P  Additional Social History:    Pain Medications: See MAR Prescriptions: See MAR Over the Counter: See MAR History of alcohol / drug use?: No history of alcohol / drug abuse Longest period of sobriety (when/how long): N/A Withdrawal Symptoms: None  Sleep: Fair  Appetite:  Good  Current Medications:  Current Facility-Administered Medications  Medication Dose Route Frequency Provider Last Rate Last Admin   alum & mag hydroxide-simeth (MAALOX/MYLANTA) 200-200-20 MG/5ML suspension 30 mL  30 mL Oral Q4H PRN Onuoha, Chinwendu V, NP       cyclobenzaprine (FLEXERIL) tablet 10 mg  10 mg Oral QHS  Onuoha, Chinwendu V, NP   10 mg at 08/20/22 2108   escitalopram (LEXAPRO) tablet 20 mg  20 mg Oral Daily Ardis Hughs, NP       gabapentin (NEURONTIN) capsule 600 mg  600 mg Oral TID Onuoha, Chinwendu V, NP   600 mg at 08/21/22 1012   hydrOXYzine (ATARAX) tablet 25 mg  25 mg Oral TID PRN Onuoha, Chinwendu V, NP       ibuprofen (ADVIL) tablet 400 mg  400 mg Oral Q6H PRN Onuoha, Chinwendu V, NP       magnesium hydroxide (MILK OF MAGNESIA) suspension 30 mL  30 mL Oral Daily PRN Onuoha, Chinwendu V, NP       metoprolol tartrate (LOPRESSOR) tablet 12.5 mg  12.5 mg Oral Daily Onuoha, Chinwendu V, NP   12.5 mg at 08/21/22 1012   OLANZapine (ZYPREXA) tablet 10 mg  10 mg Oral QHS Onuoha, Chinwendu V, NP   10 mg at 08/20/22 2108   pantoprazole (PROTONIX) EC tablet 40 mg  40 mg Oral QAC breakfast Ardis Hughs, NP       prazosin (MINIPRESS) capsule 1 mg  1 mg Oral QHS Onuoha, Chinwendu V, NP   1 mg at 08/20/22 2108   Current Outpatient Medications  Medication Sig Dispense Refill   albuterol (VENTOLIN HFA) 108 (90 Base) MCG/ACT inhaler Inhale 2 puffs into the lungs every 6 (six) hours as needed for wheezing or shortness of breath.     cyclobenzaprine (FLEXERIL) 10 MG tablet Take 10 mg by mouth at bedtime.     diclofenac Sodium (VOLTAREN) 1 % GEL Apply 2 g topically 2 (two) times daily. 2 g 0   escitalopram (LEXAPRO) 20 MG tablet Take 1 tablet (20 mg total) by mouth daily.     fluticasone-salmeterol (ADVAIR) 250-50 MCG/ACT AEPB Inhale 1 puff into the lungs 2 (two) times daily.     gabapentin (NEURONTIN) 600 MG tablet Take 600 mg by mouth 3 (three) times daily.     hydrOXYzine (ATARAX) 25 MG tablet Take 1 tablet (25 mg total) by mouth 3 (three) times daily as needed for anxiety. 30 tablet 0   metoprolol tartrate (LOPRESSOR) 25 MG tablet Take 0.5 tablets (12.5 mg total) by mouth 2 (two) times daily for 20 days. 20 tablet 0   OLANZapine (ZYPREXA) 10 MG tablet Take 10 mg by mouth at bedtime.      pantoprazole (PROTONIX) 40 MG tablet Take 40 mg by mouth daily before breakfast.     prazosin (MINIPRESS) 1 MG capsule Take 1 mg by mouth at bedtime.      Labs  Lab Results:  Admission on 08/20/2022  Component Date Value Ref Range Status   WBC 08/20/2022 8.2  4.0 - 10.5 K/uL Final   RBC 08/20/2022 4.80  3.87 - 5.11 MIL/uL Final   Hemoglobin 08/20/2022 11.9 (L)  12.0 - 15.0 g/dL Final   HCT 16/11/9602 38.5  36.0 - 46.0 % Final   MCV 08/20/2022 80.2  80.0 - 100.0 fL Final   MCH 08/20/2022 24.8 (L)  26.0 - 34.0 pg Final   MCHC 08/20/2022 30.9  30.0 -  36.0 g/dL Final   RDW 82/95/6213 14.9  11.5 - 15.5 % Final   Platelets 08/20/2022 372  150 - 400 K/uL Final   nRBC 08/20/2022 0.0  0.0 - 0.2 % Final   Neutrophils Relative % 08/20/2022 64  % Final   Neutro Abs 08/20/2022 5.2  1.7 - 7.7 K/uL Final   Lymphocytes Relative 08/20/2022 28  % Final   Lymphs Abs 08/20/2022 2.3  0.7 - 4.0 K/uL Final   Monocytes Relative 08/20/2022 6  % Final   Monocytes Absolute 08/20/2022 0.5  0.1 - 1.0 K/uL Final   Eosinophils Relative 08/20/2022 1  % Final   Eosinophils Absolute 08/20/2022 0.1  0.0 - 0.5 K/uL Final   Basophils Relative 08/20/2022 1  % Final   Basophils Absolute 08/20/2022 0.1  0.0 - 0.1 K/uL Final   Immature Granulocytes 08/20/2022 0  % Final   Abs Immature Granulocytes 08/20/2022 0.03  0.00 - 0.07 K/uL Final   Performed at [facility], [address] [place], [place] [address]   Sodium 08/20/2022 138  135 - 145 mmol/L Final   Potassium 08/20/2022 3.6  3.5 - 5.1 mmol/L Final   Chloride 08/20/2022 107  98 - 111 mmol/L Final   CO2 08/20/2022 22  22 - 32 mmol/L Final   Glucose, Bld 08/20/2022 90  70 - 99 mg/dL Final   Glucose reference range applies only to samples taken after fasting for at least 8 hours.   BUN 08/20/2022 10  6 - 20 mg/dL Final   Creatinine, Ser 08/20/2022 0.70  0.44 - 1.00 mg/dL Final   Calcium 84/69/6295 9.3  8.9 - 10.3 mg/dL Final   Total Protein 28/41/3244 7.7   6.5 - 8.1 g/dL Final   Albumin 02/07/7251 3.9  3.5 - 5.0 g/dL Final   AST 66/44/0347 20  15 - 41 U/L Final   ALT 08/20/2022 19  0 - 44 U/L Final   Alkaline Phosphatase 08/20/2022 84  38 - 126 U/L Final   Total Bilirubin 08/20/2022 0.1 (L)  0.3 - 1.2 mg/dL Final   GFR, Estimated 08/20/2022 >60  >60 mL/min Final   Comment: (NOTE) Calculated using the CKD-EPI Creatinine Equation (2021)    Anion gap 08/20/2022 9  5 - 15 Final   Performed at [facility], [address] [place], [place] [address]   Preg Test, Ur 08/20/2022 Negative  Negative Final   POC Amphetamine UR 08/20/2022 None Detected  NONE DETECTED (Cut Off Level 1000 ng/mL) Final   POC Secobarbital (BAR) 08/20/2022 None Detected  NONE DETECTED (Cut Off Level 300 ng/mL) Final   POC Buprenorphine (BUP) 08/20/2022 None Detected  NONE DETECTED (Cut Off Level 10 ng/mL) Final   POC Oxazepam (BZO) 08/20/2022 None Detected  NONE DETECTED (Cut Off Level 300 ng/mL) Final   POC Cocaine UR 08/20/2022 None Detected  NONE DETECTED (Cut Off Level 300 ng/mL) Final   POC Methamphetamine UR 08/20/2022 None Detected  NONE DETECTED (Cut Off Level 1000 ng/mL) Final   POC Morphine 08/20/2022 None Detected  NONE DETECTED (Cut Off Level 300 ng/mL) Final   POC Methadone UR 08/20/2022 None Detected  NONE DETECTED (Cut Off Level 300 ng/mL) Final   POC Oxycodone UR 08/20/2022 None Detected  NONE DETECTED (Cut Off Level 100 ng/mL) Final   POC Marijuana UR 08/20/2022 None Detected  NONE DETECTED (Cut Off Level 50 ng/mL) Final   Preg Test, Ur 08/20/2022 NEGATIVE  NEGATIVE Final   Comment:  THE SENSITIVITY OF THIS METHODOLOGY IS >24 mIU/mL   Admission on 08/08/2022, Discharged on 08/12/2022  Component Date Value Ref Range Status   Sodium 08/09/2022 136  135 - 145 mmol/L Final   Potassium 08/09/2022 4.1  3.5 - 5.1 mmol/L Final   Chloride 08/09/2022 105  98 - 111 mmol/L Final   CO2 08/09/2022 23  22 - 32 mmol/L Final   Glucose, Bld 08/09/2022 109  (H)  70 - 99 mg/dL Final   Glucose reference range applies only to samples taken after fasting for at least 8 hours.   BUN 08/09/2022 13  6 - 20 mg/dL Final   Creatinine, Ser 08/09/2022 0.69  0.44 - 1.00 mg/dL Final   Calcium 16/11/9602 9.0  8.9 - 10.3 mg/dL Final   Total Protein 54/10/8117 7.0  6.5 - 8.1 g/dL Final   Albumin 14/78/2956 3.5  3.5 - 5.0 g/dL Final   AST 21/30/8657 20  15 - 41 U/L Final   ALT 08/09/2022 22  0 - 44 U/L Final   Alkaline Phosphatase 08/09/2022 67  38 - 126 U/L Final   Total Bilirubin 08/09/2022 0.3  0.3 - 1.2 mg/dL Final   GFR, Estimated 08/09/2022 >60  >60 mL/min Final   Comment: (NOTE) Calculated using the CKD-EPI Creatinine Equation (2021)    Anion gap 08/09/2022 8  5 - 15 Final   Performed at [facility], [address], [place], [place] [address]   Alcohol, Ethyl (B) 08/09/2022 <10  <10 mg/dL Final   Comment: (NOTE) Lowest detectable limit for serum alcohol is 10 mg/dL.  For medical purposes only. Performed at [facility], [address] [place], [place] [address]    Opiates 08/09/2022 NONE DETECTED  NONE DETECTED Final   Cocaine 08/09/2022 NONE DETECTED  NONE DETECTED Final   Benzodiazepines 08/09/2022 NONE DETECTED  NONE DETECTED Final   Amphetamines 08/09/2022 NONE DETECTED  NONE DETECTED Final   Tetrahydrocannabinol 08/09/2022 NONE DETECTED  NONE DETECTED Final   Barbiturates 08/09/2022 NONE DETECTED  NONE DETECTED Final   Comment: (NOTE) DRUG SCREEN FOR MEDICAL PURPOSES ONLY.  IF CONFIRMATION IS NEEDED FOR ANY PURPOSE, NOTIFY LAB WITHIN 5 DAYS.  LOWEST DETECTABLE LIMITS FOR URINE DRUG SCREEN Drug Class                     Cutoff (ng/mL) Amphetamine and metabolites    1000 Barbiturate and metabolites    200 Benzodiazepine                 200 Opiates and metabolites        300 Cocaine and metabolites        300 THC                            50 Performed at [facility], [address], [place], [place] [address]    Preg, Serum 08/09/2022 NEGATIVE  NEGATIVE Final   Comment:        THE SENSITIVITY OF THIS METHODOLOGY IS >10 mIU/mL. Performed at [facility], [address], [place], [place] [address]    Salicylate Lvl 08/09/2022 <7.0 (L)  7.0 - 30.0 mg/dL Final   Performed at [facility], [address] [place], [place] [address]   Acetaminophen (Tylenol), Serum 08/09/2022 <10 (L)  10 - 30 ug/mL Final   Comment: (NOTE) Therapeutic concentrations vary significantly. A range of 10-30  ug/mL  may be an effective concentration for many patients. However, some  are best treated at concentrations outside of this range. Acetaminophen concentrations >150 ug/mL at 4 hours after ingestion  and >50 ug/mL at 12 hours after ingestion are often associated with  toxic reactions.  Performed at [facility], [address] [place], [place] [address]    WBC 08/09/2022 10.2  4.0 - 10.5 K/uL Final   RBC 08/09/2022 4.06  3.87 - 5.11 MIL/uL Final   Hemoglobin 08/09/2022 10.1 (L)  12.0 - 15.0 g/dL Final   HCT 02/13/3233 32.4 (L)  36.0 - 46.0 % Final   MCV 08/09/2022 79.8 (L)  80.0 - 100.0 fL Final   MCH 08/09/2022 24.9 (L)  26.0 - 34.0 pg Final   MCHC 08/09/2022 31.2  30.0 - 36.0 g/dL Final   RDW 57/32/2025 15.2  11.5 - 15.5 % Final   Platelets 08/09/2022 304  150 - 400 K/uL Final   nRBC 08/09/2022 0.0  0.0 - 0.2 % Final   Neutrophils Relative % 08/09/2022 67  % Final   Neutro Abs 08/09/2022 6.9  1.7 - 7.7 K/uL Final   Lymphocytes Relative 08/09/2022 26  % Final   Lymphs Abs 08/09/2022 2.6  0.7 - 4.0 K/uL Final   Monocytes Relative 08/09/2022 5  % Final   Monocytes Absolute 08/09/2022 0.6  0.1 - 1.0 K/uL Final   Eosinophils Relative 08/09/2022 1  % Final   Eosinophils Absolute 08/09/2022 0.1  0.0 - 0.5 K/uL Final   Basophils Relative 08/09/2022 1  % Final   Basophils Absolute 08/09/2022 0.1  0.0 - 0.1 K/uL  Final   Immature Granulocytes 08/09/2022 0  % Final   Abs Immature Granulocytes 08/09/2022 0.03  0.00 - 0.07 K/uL Final   Performed at [facility], [address], [place], [place] [address]   Acetaminophen (Tylenol), Serum 08/09/2022 <10 (L)  10 - 30 ug/mL Final   Comment: (NOTE) Therapeutic concentrations vary significantly. A range of 10-30 ug/mL  may be an effective concentration for many patients. However, some  are best treated at concentrations outside of this range. Acetaminophen concentrations >150 ug/mL at 4 hours after ingestion  and >50 ug/mL at 12 hours after ingestion are often associated with  toxic reactions.  Performed at [facility], [address], [place], [place] [address]    Glucose-Capillary 08/09/2022 120 (H)  70 - 99 mg/dL Final   Glucose reference range applies only to samples taken after fasting for at least 8 hours.   Sodium 08/09/2022 135  135 - 145 mmol/L Final   Potassium 08/09/2022 4.2  3.5 - 5.1 mmol/L Final   Chloride 08/09/2022 105  98 - 111 mmol/L Final   CO2 08/09/2022 24  22 - 32 mmol/L Final   Glucose, Bld 08/09/2022 110 (H)  70 - 99 mg/dL Final   Glucose reference range applies only to samples taken after fasting for at least 8 hours.   BUN 08/09/2022 13  6 - 20 mg/dL Final   Creatinine, Ser 08/09/2022 0.88  0.44 - 1.00 mg/dL Final   Calcium 83/15/1761 9.0  8.9 - 10.3 mg/dL Final   GFR, Estimated 08/09/2022 >60  >60 mL/min Final   Comment: (NOTE) Calculated using the CKD-EPI Creatinine Equation (2021)    Anion gap 08/09/2022 6  5 - 15 Final   Performed at [facility], [address], [place], [place] [address]   pH, Ven 08/09/2022 7.37  7.25 - 7.43 Final   pCO2, Ven 08/09/2022 52  44 - 60 mmHg Final   pO2, Ven 08/09/2022 <31 (LL)  32 - 45 mmHg Final   Comment: CRITICAL RESULT CALLED TO, READ BACK BY AND VERIFIED WITH: FOX B. RN AT 1202 ON 08/09/2022 BY MECIAL J.    Bicarbonate  08/09/2022 30.1 (H)  20.0 - 28.0 mmol/L Final   Acid-Base Excess 08/09/2022 3.6 (H)  0.0 - 2.0 mmol/L Final   O2 Saturation 08/09/2022 38.3  % Final   Patient temperature 08/09/2022 37.0   Final   Performed at [facility], [address] [place], [place] [address]   Ammonia 08/09/2022 13  9 - 35 umol/L Final   Performed at [facility], [address] [place], [place] [address]   Magnesium 08/09/2022 1.8  1.7 - 2.4 mg/dL Final   Performed at [facility], [address], [place], [place] [address]  Admission on 08/04/2022, Discharged on 08/05/2022  Component Date Value Ref Range Status   Sodium 08/04/2022 143  135 - 145 mmol/L Final   Potassium 08/04/2022 4.1  3.5 - 5.1 mmol/L Final   Chloride 08/04/2022 108  98 - 111 mmol/L Final   CO2 08/04/2022 19 (L)  22 - 32 mmol/L Final   Glucose, Bld 08/04/2022 145 (H)  70 - 99 mg/dL Final   Glucose reference range applies only to samples taken after fasting for at least 8 hours.   BUN 08/04/2022 10  6 - 20 mg/dL Final   Creatinine, Ser 08/04/2022 0.74  0.44 - 1.00 mg/dL Final   Calcium 56/38/7564 9.3  8.9 - 10.3 mg/dL Final   GFR, Estimated 08/04/2022 >60  >60 mL/min Final   Comment: (NOTE) Calculated using the CKD-EPI Creatinine Equation (2021)    Anion gap 08/04/2022 16 (H)  5 - 15 Final   Performed at [facility], [address], [place], [place] [address]   WBC 08/04/2022 10.2  4.0 - 10.5 K/uL Final   RBC 08/04/2022 4.68  3.87 - 5.11 MIL/uL Final   Hemoglobin 08/04/2022 11.2 (L)  12.0 - 15.0 g/dL Final   HCT 18/84/1660 37.6  36.0 - 46.0 % Final   MCV 08/04/2022 80.3  80.0 - 100.0 fL Final   MCH 08/04/2022 23.9 (L)  26.0 - 34.0 pg Final   MCHC 08/04/2022 29.8 (L)  30.0 - 36.0 g/dL Final   RDW 63/02/6008 15.1  11.5 - 15.5 % Final   Platelets 08/04/2022 329  150 - 400 K/uL Final   nRBC 08/04/2022 0.0  0.0 - 0.2 % Final   Performed at [facility], [address] [place], [place] [address]   Preg, Serum 08/04/2022 NEGATIVE  NEGATIVE Final   Performed at [facility], [address] [place], [place] [address]   D-Dimer, Quant 08/04/2022 <0.27  0.00 - 0.50 ug/mL-FEU Final   Comment: (NOTE) At the manufacturer cut-off value of 0.5 g/mL FEU, this assay has a negative predictive value of 95-100%.This assay is intended for use in conjunction with a clinical pretest probability (PTP) assessment model to exclude pulmonary embolism (PE) and deep venous thrombosis (DVT) in outpatients suspected of PE or DVT. Results should be correlated with clinical presentation. Performed at [facility], [address], [place], [place] [address]    Total Protein 08/04/2022 6.7  6.5 - 8.1 g/dL Final   Albumin 70/62/3762 3.4 (L)  3.5 - 5.0 g/dL Final  AST 08/04/2022 23  15 - 41 U/L Final   ALT 08/04/2022 25  0 - 44 U/L Final   Alkaline Phosphatase 08/04/2022 65  38 - 126 U/L Final   Total Bilirubin 08/04/2022 0.4  0.3 - 1.2 mg/dL Final   Bilirubin, Direct 08/04/2022 <0.1  0.0 - 0.2 mg/dL Final   Indirect Bilirubin 08/04/2022 NOT CALCULATED  0.3 - 0.9 mg/dL Final   Performed at [facility], [address] [place], [place] [address]   Opiates 08/04/2022 NONE DETECTED  NONE DETECTED Final   Cocaine 08/04/2022 NONE DETECTED  NONE DETECTED Final   Benzodiazepines 08/04/2022 NONE DETECTED  NONE DETECTED Final   Amphetamines 08/04/2022 NONE DETECTED  NONE DETECTED Final   Tetrahydrocannabinol 08/04/2022 NONE DETECTED  NONE DETECTED Final   Barbiturates 08/04/2022 NONE DETECTED  NONE DETECTED Final   Comment: (NOTE) DRUG SCREEN FOR MEDICAL PURPOSES ONLY.  IF CONFIRMATION IS NEEDED FOR ANY PURPOSE, NOTIFY LAB WITHIN 5 DAYS.  LOWEST DETECTABLE LIMITS FOR URINE DRUG SCREEN Drug Class                     Cutoff (ng/mL) Amphetamine and metabolites    1000 Barbiturate and metabolites    200 Benzodiazepine                 200 Opiates and metabolites         300 Cocaine and metabolites        300 THC                            50 Performed at [facility], [address], [place], [place] [address]   Admission on 07/29/2022, Discharged on 08/02/2022  Component Date Value Ref Range Status   Iron 07/31/2022 50  28 - 170 ug/dL Final   TIBC 21/30/8657 409  250 - 450 ug/dL Final   Saturation Ratios 07/31/2022 12  10.4 - 31.8 % Final   UIBC 07/31/2022 359  ug/dL Final   Performed at [facility], [address] [place], [place] [address]   Ferritin 07/31/2022 7 (L)  11 - 307 ng/mL Final   Performed at [facility], [address], [place], [place] [address]  Admission on 07/29/2022, Discharged on 07/29/2022  Component Date Value Ref Range Status   Sodium 07/29/2022 137  135 - 145 mmol/L Final   Potassium 07/29/2022 3.7  3.5 - 5.1 mmol/L Final   Chloride 07/29/2022 102  98 - 111 mmol/L Final   CO2 07/29/2022 23  22 - 32 mmol/L Final   Glucose, Bld 07/29/2022 101 (H)  70 - 99 mg/dL Final   Glucose reference range applies only to samples taken after fasting for at least 8 hours.   BUN 07/29/2022 7  6 - 20 mg/dL Final   Creatinine, Ser 07/29/2022 0.76  0.44 - 1.00 mg/dL Final   Calcium 41/32/4401 9.0  8.9 - 10.3 mg/dL Final   Total Protein 02/72/5366 7.2  6.5 - 8.1 g/dL Final   Albumin 44/04/4740 3.8  3.5 - 5.0 g/dL Final   AST 59/56/3875 19  15 - 41 U/L Final   ALT 07/29/2022 16  0 - 44 U/L Final   Alkaline Phosphatase 07/29/2022 75  38 - 126 U/L Final   Total Bilirubin 07/29/2022 0.3  0.3 - 1.2 mg/dL Final   GFR, Estimated 07/29/2022 >60  >60 mL/min Final   Comment: (NOTE)  Calculated using the CKD-EPI Creatinine Equation (2021)    Anion gap 07/29/2022 12  5 - 15 Final   Performed at [facility], [address] [place], [place] [address]   Alcohol, Ethyl (B) 07/29/2022 <10  <10 mg/dL Final   Comment: (NOTE) Lowest detectable limit for serum alcohol is 10 mg/dL.  For medical  purposes only. Performed at [facility], [address] [place], [place] [address]    Salicylate Lvl 07/29/2022 <7.0 (L)  7.0 - 30.0 mg/dL Final   Performed at [facility], [address], [place], [place] [address]   Acetaminophen (Tylenol), Serum 07/29/2022 <10 (L)  10 - 30 ug/mL Final   Comment: (NOTE) Therapeutic concentrations vary significantly. A range of 10-30 ug/mL  may be an effective concentration for many patients. However, some  are best treated at concentrations outside of this range. Acetaminophen concentrations >150 ug/mL at 4 hours after ingestion  and >50 ug/mL at 12 hours after ingestion are often associated with  toxic reactions.  Performed at [facility], [address] [place], [place] [address]    WBC 07/29/2022 10.8 (H)  4.0 - 10.5 K/uL Final   RBC 07/29/2022 4.52  3.87 - 5.11 MIL/uL Final   Hemoglobin 07/29/2022 11.0 (L)  12.0 - 15.0 g/dL Final   HCT 29/56/2130 35.9 (L)  36.0 - 46.0 % Final   MCV 07/29/2022 79.4 (L)  80.0 - 100.0 fL Final   MCH 07/29/2022 24.3 (L)  26.0 - 34.0 pg Final   MCHC 07/29/2022 30.6  30.0 - 36.0 g/dL Final   RDW 86/57/8469 14.6  11.5 - 15.5 % Final   Platelets 07/29/2022 288  150 - 400 K/uL Final   nRBC 07/29/2022 0.0  0.0 - 0.2 % Final   Performed at [facility], [address], [place], [place] [address]   Opiates 07/29/2022 NONE DETECTED  NONE DETECTED Final   Cocaine 07/29/2022 NONE DETECTED  NONE DETECTED Final   Benzodiazepines 07/29/2022 NONE DETECTED  NONE DETECTED Final   Amphetamines 07/29/2022 NONE DETECTED  NONE DETECTED Final   Tetrahydrocannabinol 07/29/2022 NONE DETECTED  NONE DETECTED Final   Barbiturates 07/29/2022 NONE DETECTED  NONE DETECTED Final   Comment: (NOTE) DRUG SCREEN FOR MEDICAL PURPOSES ONLY.  IF CONFIRMATION IS NEEDED FOR ANY PURPOSE, NOTIFY LAB WITHIN 5 DAYS.  LOWEST DETECTABLE LIMITS FOR URINE DRUG SCREEN Drug Class                     Cutoff  (ng/mL) Amphetamine and metabolites    1000 Barbiturate and metabolites    200 Benzodiazepine                 200 Opiates and metabolites        300 Cocaine and metabolites        300 THC                            50 Performed at [facility], [address], [place], [place] [address]    Preg Test, Ur 07/29/2022 NEGATIVE  NEGATIVE Final   Comment:        THE SENSITIVITY OF THIS METHODOLOGY IS >25 mIU/mL. Performed at [facility], [address], [place], [place] [address]   Admission on 07/29/2022, Discharged on 07/29/2022  Component Date Value Ref Range Status   WBC 07/29/2022 8.8  4.0 -  10.5 K/uL Final   RBC 07/29/2022 4.37  3.87 - 5.11 MIL/uL Final   Hemoglobin 07/29/2022 10.6 (L)  12.0 - 15.0 g/dL Final   HCT 62/13/0865 34.8 (L)  36.0 - 46.0 % Final   MCV 07/29/2022 79.6 (L)  80.0 - 100.0 fL Final   MCH 07/29/2022 24.3 (L)  26.0 - 34.0 pg Final   MCHC 07/29/2022 30.5  30.0 - 36.0 g/dL Final   RDW 78/46/9629 14.4  11.5 - 15.5 % Final   Platelets 07/29/2022 260  150 - 400 K/uL Final   nRBC 07/29/2022 0.0  0.0 - 0.2 % Final   Neutrophils Relative % 07/29/2022 59  % Final   Neutro Abs 07/29/2022 5.3  1.7 - 7.7 K/uL Final   Lymphocytes Relative 07/29/2022 31  % Final   Lymphs Abs 07/29/2022 2.7  0.7 - 4.0 K/uL Final   Monocytes Relative 07/29/2022 7  % Final   Monocytes Absolute 07/29/2022 0.6  0.1 - 1.0 K/uL Final   Eosinophils Relative 07/29/2022 1  % Final   Eosinophils Absolute 07/29/2022 0.1  0.0 - 0.5 K/uL Final   Basophils Relative 07/29/2022 1  % Final   Basophils Absolute 07/29/2022 0.1  0.0 - 0.1 K/uL Final   Immature Granulocytes 07/29/2022 1  % Final   Abs Immature Granulocytes 07/29/2022 0.04  0.00 - 0.07 K/uL Final   Performed at [facility], [address], [place], [place] [address]   Sodium 07/29/2022 136  135 - 145 mmol/L Final   Potassium 07/29/2022 3.9  3.5 - 5.1 mmol/L Final   Chloride 07/29/2022 104  98 - 111 mmol/L Final   CO2  07/29/2022 23  22 - 32 mmol/L Final   Glucose, Bld 07/29/2022 102 (H)  70 - 99 mg/dL Final   Glucose reference range applies only to samples taken after fasting for at least 8 hours.   BUN 07/29/2022 7  6 - 20 mg/dL Final   Creatinine, Ser 07/29/2022 0.74  0.44 - 1.00 mg/dL Final   Calcium 32/44/0102 8.8 (L)  8.9 - 10.3 mg/dL Final   GFR, Estimated 07/29/2022 >60  >60 mL/min Final   Comment: (NOTE) Calculated using the CKD-EPI Creatinine Equation (2021)    Anion gap 07/29/2022 9  5 - 15 Final   Performed at [facility], [address], [place], [place] [address]   Troponin I (High Sensitivity) 07/29/2022 3  <18 ng/L Final   Comment: (NOTE) Elevated high sensitivity troponin I (hsTnI) values and significant  changes across serial measurements may suggest ACS but many other  chronic and acute conditions are known to elevate hsTnI results.  Refer to the "Links" section for chest pain algorithms and additional  guidance. Performed at [facility], [address] [place], [place] [address]   Admission on 07/13/2022, Discharged on 07/14/2022  Component Date Value Ref Range Status   Sodium 07/13/2022 136  135 - 145 mmol/L Final   Potassium 07/13/2022 3.6  3.5 - 5.1 mmol/L Final   Chloride 07/13/2022 105  98 - 111 mmol/L Final   CO2 07/13/2022 22  22 - 32 mmol/L Final   Glucose, Bld 07/13/2022 133 (H)  70 - 99 mg/dL Final   Glucose reference range applies only to samples taken after fasting for at least 8 hours.   BUN 07/13/2022 7  6 - 20 mg/dL Final   Creatinine, Ser 07/13/2022 0.73  0.44 - 1.00 mg/dL Final   Calcium 47/42/5956 8.8 (L)  8.9 - 10.3 mg/dL Final   Total Protein 28/41/3244 7.0  6.5 - 8.1 g/dL Final   Albumin 02/07/7251 3.6  3.5 - 5.0 g/dL Final   AST 66/44/0347 18  15 - 41 U/L Final   ALT 07/13/2022 14  0 - 44 U/L Final   Alkaline Phosphatase 07/13/2022 61  38 - 126 U/L Final   Total Bilirubin 07/13/2022 0.2 (L)  0.3 - 1.2 mg/dL Final   GFR, Estimated  07/13/2022 >60  >60 mL/min Final   Comment: (NOTE) Calculated using the CKD-EPI Creatinine Equation (2021)    Anion gap 07/13/2022 9  5 - 15 Final   Performed at [facility], [address] [place], [place] [address]   Alcohol, Ethyl (B) 07/13/2022 <10  <10 mg/dL Final   Comment: (NOTE) Lowest detectable limit for serum alcohol is 10 mg/dL.  For medical purposes only. Performed at [facility] [address] [place], [place] [address]    Salicylate Lvl 07/13/2022 <7.0 (L)  7.0 - 30.0 mg/dL Final   Performed at [facility], [address], [place], [place] [address]   Acetaminophen (Tylenol), Serum 07/13/2022 <10 (L)  10 - 30 ug/mL Final   Comment: (NOTE) Therapeutic concentrations vary significantly. A range of 10-30 ug/mL  may be an effective concentration for many patients. However, some  are best treated at concentrations outside of this range. Acetaminophen concentrations >150 ug/mL at 4 hours after ingestion  and >50 ug/mL at 12 hours after ingestion are often associated with  toxic reactions.  Performed at [facility], [address] [place], [place] [address]    WBC 07/13/2022 9.8  4.0 - 10.5 K/uL Final   RBC 07/13/2022 4.33  3.87 - 5.11 MIL/uL Final   Hemoglobin 07/13/2022 10.7 (L)  12.0 - 15.0 g/dL Final   HCT 41/66/0630 34.7 (L)  36.0 - 46.0 % Final   MCV 07/13/2022 80.1  80.0 - 100.0 fL Final   MCH 07/13/2022 24.7 (L)  26.0 - 34.0 pg Final   MCHC 07/13/2022 30.8  30.0 - 36.0 g/dL Final   RDW 16/02/930 14.3  11.5 - 15.5 % Final   Platelets 07/13/2022 278  150 - 400 K/uL Final   nRBC 07/13/2022 0.0  0.0 - 0.2 % Final   Performed at [facility], [address], [place], [place] [address]   Opiates 07/13/2022 NONE DETECTED  NONE DETECTED Final   Cocaine 07/13/2022 NONE DETECTED  NONE DETECTED Final   Benzodiazepines 07/13/2022 NONE DETECTED  NONE DETECTED Final    Amphetamines 07/13/2022 NONE DETECTED  NONE DETECTED Final   Tetrahydrocannabinol 07/13/2022 NONE DETECTED  NONE DETECTED Final   Barbiturates 07/13/2022 NONE DETECTED  NONE DETECTED Final   Comment: (NOTE) DRUG SCREEN FOR MEDICAL PURPOSES ONLY.  IF CONFIRMATION IS NEEDED FOR ANY PURPOSE, NOTIFY LAB WITHIN 5 DAYS.  LOWEST DETECTABLE LIMITS FOR URINE DRUG SCREEN Drug Class                     Cutoff (ng/mL) Amphetamine and metabolites    1000 Barbiturate and metabolites    200 Benzodiazepine                 200 Opiates and metabolites        300 Cocaine and metabolites        300 THC  50 Performed at [facility], [address], [place], [place] [address]    I-stat hCG, quantitative 07/13/2022 <5.0  <5 mIU/mL Final   Comment 3 07/13/2022          Final   Comment:   GEST. AGE      CONC.  (mIU/mL)   <=1 WEEK        5 - 50     2 WEEKS       50 - 500     3 WEEKS       100 - 10,000     4 WEEKS     1,000 - 30,000        FEMALE AND NON-PREGNANT FEMALE:     LESS THAN 5 mIU/mL    Acetaminophen (Tylenol), Serum 07/14/2022 <10 (L)  10 - 30 ug/mL Final   Comment: (NOTE) Therapeutic concentrations vary significantly. A range of 10-30 ug/mL  may be an effective concentration for many patients. However, some  are best treated at concentrations outside of this range. Acetaminophen concentrations >150 ug/mL at 4 hours after ingestion  and >50 ug/mL at 12 hours after ingestion are often associated with  toxic reactions.  Performed at [facility], [address], [place], [place] [address]   Admission on 07/06/2022, Discharged on 07/06/2022  Component Date Value Ref Range Status   Preg Test, Ur 07/06/2022 Negative  Negative Final   POC Amphetamine UR 07/06/2022 None Detected  NONE DETECTED (Cut Off Level 1000 ng/mL) Final   POC Secobarbital (BAR) 07/06/2022 None Detected  NONE DETECTED (Cut Off Level 300 ng/mL) Final   POC  Buprenorphine (BUP) 07/06/2022 None Detected  NONE DETECTED (Cut Off Level 10 ng/mL) Final   POC Oxazepam (BZO) 07/06/2022 Positive (A)  NONE DETECTED (Cut Off Level 300 ng/mL) Final   POC Cocaine UR 07/06/2022 None Detected  NONE DETECTED (Cut Off Level 300 ng/mL) Final   POC Methamphetamine UR 07/06/2022 None Detected  NONE DETECTED (Cut Off Level 1000 ng/mL) Final   POC Morphine 07/06/2022 None Detected  NONE DETECTED (Cut Off Level 300 ng/mL) Final   POC Methadone UR 07/06/2022 None Detected  NONE DETECTED (Cut Off Level 300 ng/mL) Final   POC Oxycodone UR 07/06/2022 None Detected  NONE DETECTED (Cut Off Level 100 ng/mL) Final   POC Marijuana UR 07/06/2022 None Detected  NONE DETECTED (Cut Off Level 50 ng/mL) Final   Preg Test, Ur 07/06/2022 NEGATIVE  NEGATIVE Final   Comment:        THE SENSITIVITY OF THIS METHODOLOGY IS >24 mIU/mL    Sodium 07/06/2022 133 (L)  135 - 145 mmol/L Final   Potassium 07/06/2022 3.2 (L)  3.5 - 5.1 mmol/L Final   Chloride 07/06/2022 104  98 - 111 mmol/L Final   CO2 07/06/2022 23  22 - 32 mmol/L Final   Glucose, Bld 07/06/2022 101 (H)  70 - 99 mg/dL Final   Glucose reference range applies only to samples taken after fasting for at least 8 hours.   BUN 07/06/2022 6  6 - 20 mg/dL Final   Creatinine, Ser 07/06/2022 0.74  0.44 - 1.00 mg/dL Final   Calcium 21/30/8657 8.9  8.9 - 10.3 mg/dL Final   Total Protein 84/69/6295 6.8  6.5 - 8.1 g/dL Final   Albumin 28/41/3244 3.8  3.5 - 5.0 g/dL Final   AST 02/07/7251 15  15 - 41 U/L Final   ALT 07/06/2022 14  0 - 44 U/L Final  Alkaline Phosphatase 07/06/2022 71  38 - 126 U/L Final   Total Bilirubin 07/06/2022 0.4  0.3 - 1.2 mg/dL Final   GFR, Estimated 07/06/2022 >60  >60 mL/min Final   Comment: (NOTE) Calculated using the CKD-EPI Creatinine Equation (2021)    Anion gap 07/06/2022 6  5 - 15 Final   Performed at [facility], [address] [place], [place] [address]   WBC 07/06/2022 7.2  4.0 - 10.5 K/uL  Final   RBC 07/06/2022 4.65  3.87 - 5.11 MIL/uL Final   Hemoglobin 07/06/2022 11.8 (L)  12.0 - 15.0 g/dL Final   HCT 53/66/4403 37.3  36.0 - 46.0 % Final   MCV 07/06/2022 80.2  80.0 - 100.0 fL Final   MCH 07/06/2022 25.4 (L)  26.0 - 34.0 pg Final   MCHC 07/06/2022 31.6  30.0 - 36.0 g/dL Final   RDW 47/42/5956 14.4  11.5 - 15.5 % Final   Platelets 07/06/2022 274  150 - 400 K/uL Final   nRBC 07/06/2022 0.0  0.0 - 0.2 % Final   Neutrophils Relative % 07/06/2022 60  % Final   Neutro Abs 07/06/2022 4.3  1.7 - 7.7 K/uL Final   Lymphocytes Relative 07/06/2022 32  % Final   Lymphs Abs 07/06/2022 2.3  0.7 - 4.0 K/uL Final   Monocytes Relative 07/06/2022 7  % Final   Monocytes Absolute 07/06/2022 0.5  0.1 - 1.0 K/uL Final   Eosinophils Relative 07/06/2022 0  % Final   Eosinophils Absolute 07/06/2022 0.0  0.0 - 0.5 K/uL Final   Basophils Relative 07/06/2022 1  % Final   Basophils Absolute 07/06/2022 0.0  0.0 - 0.1 K/uL Final   Immature Granulocytes 07/06/2022 0  % Final   Abs Immature Granulocytes 07/06/2022 0.02  0.00 - 0.07 K/uL Final   Performed at [facility], [address] [place], [place] [address]  Admission on 06/22/2022, Discharged on 06/23/2022  Component Date Value Ref Range Status   WBC 06/22/2022 8.1  4.0 - 10.5 K/uL Final   RBC 06/22/2022 4.45  3.87 - 5.11 MIL/uL Final   Hemoglobin 06/22/2022 10.8 (L)  12.0 - 15.0 g/dL Final   HCT 43/32/9518 34.7 (L)  36.0 - 46.0 % Final   MCV 06/22/2022 78.0 (L)  80.0 - 100.0 fL Final   MCH 06/22/2022 24.3 (L)  26.0 - 34.0 pg Final   MCHC 06/22/2022 31.1  30.0 - 36.0 g/dL Final   RDW 84/16/6063 14.4  11.5 - 15.5 % Final   Platelets 06/22/2022 299  150 - 400 K/uL Final   nRBC 06/22/2022 0.0  0.0 - 0.2 % Final   Neutrophils Relative % 06/22/2022 63  % Final   Neutro Abs 06/22/2022 5.1  1.7 - 7.7 K/uL Final   Lymphocytes Relative 06/22/2022 30  % Final   Lymphs Abs 06/22/2022 2.4  0.7 - 4.0 K/uL Final   Monocytes Relative 06/22/2022 6   % Final   Monocytes Absolute 06/22/2022 0.5  0.1 - 1.0 K/uL Final   Eosinophils Relative 06/22/2022 1  % Final   Eosinophils Absolute 06/22/2022 0.1  0.0 - 0.5 K/uL Final   Basophils Relative 06/22/2022 0  % Final   Basophils Absolute 06/22/2022 0.0  0.0 - 0.1 K/uL Final   Immature Granulocytes 06/22/2022 0  % Final   Abs Immature Granulocytes 06/22/2022 0.03  0.00 - 0.07 K/uL Final   Performed at [facility], [address], [place], [place] [address]  Sodium 06/22/2022 138  135 - 145 mmol/L Final   Potassium 06/22/2022 3.6  3.5 - 5.1 mmol/L Final   Chloride 06/22/2022 104  98 - 111 mmol/L Final   CO2 06/22/2022 22  22 - 32 mmol/L Final   Glucose, Bld 06/22/2022 93  70 - 99 mg/dL Final   Glucose reference range applies only to samples taken after fasting for at least 8 hours.   BUN 06/22/2022 5 (L)  6 - 20 mg/dL Final   Creatinine, Ser 06/22/2022 0.74  0.44 - 1.00 mg/dL Final   Calcium 40/98/1191 9.2  8.9 - 10.3 mg/dL Final   Total Protein 47/82/9562 6.6  6.5 - 8.1 g/dL Final   Albumin 13/09/6576 3.9  3.5 - 5.0 g/dL Final   AST 46/96/2952 16  15 - 41 U/L Final   ALT 06/22/2022 16  0 - 44 U/L Final   Alkaline Phosphatase 06/22/2022 64  38 - 126 U/L Final   Total Bilirubin 06/22/2022 0.3  0.3 - 1.2 mg/dL Final   GFR, Estimated 06/22/2022 >60  >60 mL/min Final   Comment: (NOTE) Calculated using the CKD-EPI Creatinine Equation (2021)    Anion gap 06/22/2022 12  5 - 15 Final   Performed at [facility], [address] [place], [place] [address]   Alcohol, Ethyl (B) 06/22/2022 <10  <10 mg/dL Final   Comment: (NOTE) Lowest detectable limit for serum alcohol is 10 mg/dL.  For medical purposes only. Performed at [facility], [address] [place], [place] [address]    Preg Test, Ur 06/22/2022 Negative  Negative Final   POC Amphetamine UR 06/22/2022 None Detected  NONE DETECTED (Cut Off Level 1000 ng/mL) Final   POC Secobarbital (BAR) 06/22/2022 None Detected   NONE DETECTED (Cut Off Level 300 ng/mL) Final   POC Buprenorphine (BUP) 06/22/2022 None Detected  NONE DETECTED (Cut Off Level 10 ng/mL) Final   POC Oxazepam (BZO) 06/22/2022 Positive (A)  NONE DETECTED (Cut Off Level 300 ng/mL) Final   POC Cocaine UR 06/22/2022 None Detected  NONE DETECTED (Cut Off Level 300 ng/mL) Final   POC Methamphetamine UR 06/22/2022 None Detected  NONE DETECTED (Cut Off Level 1000 ng/mL) Final   POC Morphine 06/22/2022 None Detected  NONE DETECTED (Cut Off Level 300 ng/mL) Final   POC Methadone UR 06/22/2022 None Detected  NONE DETECTED (Cut Off Level 300 ng/mL) Final   POC Oxycodone UR 06/22/2022 None Detected  NONE DETECTED (Cut Off Level 100 ng/mL) Final   POC Marijuana UR 06/22/2022 None Detected  NONE DETECTED (Cut Off Level 50 ng/mL) Final  Admission on 06/16/2022, Discharged on 06/18/2022  Component Date Value Ref Range Status   Sodium 06/16/2022 139  135 - 145 mmol/L Final   Potassium 06/16/2022 3.5  3.5 - 5.1 mmol/L Final   Chloride 06/16/2022 109  98 - 111 mmol/L Final   CO2 06/16/2022 21 (L)  22 - 32 mmol/L Final   Glucose, Bld 06/16/2022 90  70 - 99 mg/dL Final   Glucose reference range applies only to samples taken after fasting for at least 8 hours.   BUN 06/16/2022 10  6 - 20 mg/dL Final   Creatinine, Ser 06/16/2022 0.71  0.44 - 1.00 mg/dL Final   Calcium 27/25/3664 8.7 (L)  8.9 - 10.3 mg/dL Final   Total Protein 40/34/7425 6.8  6.5 - 8.1 g/dL Final   Albumin 95/63/8756 3.7  3.5 - 5.0 g/dL Final   AST 43/32/9518 18  15 -  41 U/L Final   ALT 06/16/2022 18  0 - 44 U/L Final   Alkaline Phosphatase 06/16/2022 59  38 - 126 U/L Final   Total Bilirubin 06/16/2022 0.4  0.3 - 1.2 mg/dL Final   GFR, Estimated 06/16/2022 >60  >60 mL/min Final   Comment: (NOTE) Calculated using the CKD-EPI Creatinine Equation (2021)    Anion gap 06/16/2022 9  5 - 15 Final   Performed at [facility], [address] [place], [place] [address]   Alcohol,  Ethyl (B) 06/16/2022 <10  <10 mg/dL Final   Comment: (NOTE) Lowest detectable limit for serum alcohol is 10 mg/dL.  For medical purposes only. Performed at [facility], [address] [place], [place] [address]    Salicylate Lvl 06/16/2022 <7.0 (L)  7.0 - 30.0 mg/dL Final   Performed at [facility], [address], [place], [place] [address]   Acetaminophen (Tylenol), Serum 06/16/2022 <10 (L)  10 - 30 ug/mL Final   Comment: (NOTE) Therapeutic concentrations vary significantly. A range of 10-30 ug/mL  may be an effective concentration for many patients. However, some  are best treated at concentrations outside of this range. Acetaminophen concentrations >150 ug/mL at 4 hours after ingestion  and >50 ug/mL at 12 hours after ingestion are often associated with  toxic reactions.  Performed at [facility], [address], [place], [place] [address]    WBC 06/16/2022 10.6 (H)  4.0 - 10.5 K/uL Final   RBC 06/16/2022 4.14  3.87 - 5.11 MIL/uL Final   Hemoglobin 06/16/2022 10.3 (L)  12.0 - 15.0 g/dL Final   HCT 84/69/6295 33.5 (L)  36.0 - 46.0 % Final   MCV 06/16/2022 80.9  80.0 - 100.0 fL Final   MCH 06/16/2022 24.9 (L)  26.0 - 34.0 pg Final   MCHC 06/16/2022 30.7  30.0 - 36.0 g/dL Final   RDW 28/41/3244 14.4  11.5 - 15.5 % Final   Platelets 06/16/2022 276  150 - 400 K/uL Final   nRBC 06/16/2022 0.0  0.0 - 0.2 % Final   Performed at [facility], [address] [place], [place] [address]   Opiates 06/17/2022 NONE DETECTED  NONE DETECTED Final   Cocaine 06/17/2022 NONE DETECTED  NONE DETECTED Final   Benzodiazepines 06/17/2022 NONE DETECTED  NONE DETECTED Final   Amphetamines 06/17/2022 NONE DETECTED  NONE DETECTED Final   Tetrahydrocannabinol 06/17/2022 NONE DETECTED  NONE DETECTED Final   Barbiturates 06/17/2022 NONE DETECTED  NONE DETECTED Final   Comment: (NOTE) DRUG SCREEN FOR MEDICAL PURPOSES ONLY.   IF CONFIRMATION IS NEEDED FOR ANY PURPOSE, NOTIFY LAB WITHIN 5 DAYS.  LOWEST DETECTABLE LIMITS FOR URINE DRUG SCREEN Drug Class                     Cutoff (ng/mL) Amphetamine and metabolites    1000 Barbiturate and metabolites    200 Benzodiazepine                 200 Opiates and metabolites        300 Cocaine and metabolites        300 THC                            50 Performed at [facility], [address] [place], [place] [address]    I-stat hCG, quantitative 06/16/2022 <5.0  <5 mIU/mL Final  Comment 3 06/16/2022          Final   Comment:   GEST. AGE      CONC.  (mIU/mL)   <=1 WEEK        5 - 50     2 WEEKS       50 - 500     3 WEEKS       100 - 10,000     4 WEEKS     1,000 - 30,000        FEMALE AND NON-PREGNANT FEMALE:     LESS THAN 5 mIU/mL   There may be more visits with results that are not included.    Blood Alcohol level:  Lab Results  Component Value Date   ETH <10 08/09/2022   ETH <10 07/29/2022    Metabolic Disorder Labs: Lab Results  Component Value Date   HGBA1C 5.2 04/20/2022   MPG 103 04/20/2022   MPG 96.8 10/07/2021   Lab Results  Component Value Date   PROLACTIN 66.2 (H) 03/02/2021   PROLACTIN 66.1 (H) 02/04/2020   Lab Results  Component Value Date   CHOL 191 04/20/2022   TRIG 163 (H) 04/20/2022   HDL 52 04/20/2022   CHOLHDL 3.7 04/20/2022   VLDL 33 04/20/2022   LDLCALC 106 (H) 04/20/2022   LDLCALC 102 (H) 12/10/2021    Therapeutic Lab Levels: No results found for: "LITHIUM" Lab Results  Component Value Date   VALPROATE 48 (L) 07/10/2015   VALPROATE 84 05/31/2015   No results found for: "CBMZ"  Physical Findings   AIMS    Flowsheet Row [facility]) [facility] Ad[facility] (Discharged) [facility] Admission (Discharged) [facility] Admission  (Discharged) [facility] Admission (Discharged) [facility]  AIMS Total Score 0 0 0 0 0      AUDIT    Flowsheet Row Ad[facility]) [facility] Admission (Discharged) from [facility] Admission (Discharged) from [facility] Admission (Discharged) from [facility] Admission (Discharged) from [facility]  Alcohol Use Disorder Identification Test Final Score (AUDIT) 0 0 0 0 0      GAD-7    Flowsheet Row Office Visit from 08/20/2016 in [facility] from 04/03/2016 in [facility] for [facility] Routine Prenatal from 03/14/2016 [facility] Routine Prenatal from 03/08/2016 in [facility] Routine Prenatal from 03/01/2016 in Center for [facility]  Total GAD-7 Score 1 19 13 9 5       PHQ2-9    Flowsheet Row ED from 03/10/2022 in [facility] ED from 02/10/2022 in [facility] ED from 06/26/2021 in [facility] at [facility] ED from [facility]/01/2021 [facility] ED from 02/03/2020 in [facility]  PHQ-2 Total Score 2 4 3 5 6   PHQ-9 Total Score 9 16 9 17 27       Flowsheet Row ED from 08/20/2022 in [facility] ED from 08/08/2022 in [facility] Department at [facility]  ED from 08/04/2022 in [facility] Emergency Department at [facility]  C-SSRS RISK CATEGORY High Risk No Risk No Risk        Musculoskeletal  Strength & Muscle  Tone: within normal limits Gait & Station: normal Patient leans: N/A  Psychiatric Specialty Exam  Presentation  General Appearance:  Casual  Eye Contact: Fair  Speech: Clear and Coherent; Normal Rate  Speech Volume: Normal  Handedness: Right   Mood and Affect  Mood: Anxious; Depressed; Labile  Affect: Congruent   Thought Process  Thought Processes: Coherent  Descriptions of Associations:Intact  Orientation:Full (Time, Place and Person)  Thought Content:Logical  Diagnosis of Schizophrenia or Schizoaffective disorder in past: Yes  Duration of Psychotic Symptoms: Greater than six months   Hallucinations:Hallucinations: Auditory Description of Auditory Hallucinations: voices that tel her to kill self  Ideas of Reference:None  Suicidal Thoughts:Suicidal Thoughts: Yes, Active SI Active Intent and/or Plan: With Intent; With Plan; With Means to Carry Out  Homicidal Thoughts:Homicidal Thoughts: No   Sensorium  Memory: Immediate Good; Recent Good; Remote Good  Judgment: Poor  Insight: Poor   Executive Functions  Concentration: Fair  Attention Span: Fair  Recall: [employer] of Knowledge: Fair  Language: Fair   Psychomotor Activity  Psychomotor Activity: Psychomotor Activity: Normal   Assets  Assets: Social Support; Resilience; Physical Health; Desire for Improvement; Communication Skills; Financial Resources/Insurance; Housing   Sleep  Sleep: Sleep: Fair   Nutritional Assessment (For OBS and [facility] admissions only) Has the patient had a weight loss or gain of 10 pounds or more in the last 3 months?: No Has the patient had a decrease in food intake/or appetite?: No Does the patient have dental problems?: No Does the patient have eating habits or behaviors that may be indicators of an eating disorder including binging or inducing vomiting?: No Has the patient recently lost weight without trying?: 0 Has the patient been eating  poorly because of a decreased appetite?: 0 Malnutrition Screening Tool Score: 0    Physical Exam  Physical Exam Vitals and nursing note reviewed.  Constitutional:      General: She is not in acute distress.    Appearance: Normal appearance.  Eyes:     General:        Right eye: No discharge.        Left eye: No discharge.  Cardiovascular:     Rate and Rhythm: Normal rate.  Pulmonary:     Effort: Pulmonary effort is normal. No respiratory distress.  Musculoskeletal:        General: Normal range of motion.     Cervical back: Normal range of motion.  Skin:    Coloration: Skin is not jaundiced or pale.  Neurological:     Mental Status: She is alert and oriented to person, place, and time.  Psychiatric:        Attention and Perception: Attention normal. She perceives auditory hallucinations.        Mood and Affect: Affect normal. Mood is anxious and depressed.        Speech: Speech normal.        Behavior: Behavior normal. Behavior is cooperative.        Thought Content: Thought content includes suicidal ideation. Thought content includes suicidal plan.        Cognition and Memory: Cognition normal.        Judgment: Judgment is impulsive.    Review of Systems  Constitutional: Negative.   HENT: Negative.  Eyes: Negative.   Respiratory: Negative.    Cardiovascular: Negative.   Musculoskeletal: Negative.   Skin: Negative.   Neurological: Negative.   Psychiatric/Behavioral:  Positive for depression and suicidal ideas. The patient is nervous/anxious.    Blood pressure 96/67, pulse (!) 113, temperature 97.8 F (36.6 C), temperature source Oral, resp. rate 18, last menstrual period 07/22/2022, SpO2 100%. There is no height or weight on file to calculate BMI.  Treatment Plan Summary: Daily contact with patient to assess and evaluate symptoms and progress in treatment and Medication management  Patient will remain in the continuous assessment unit for overnight observation.   She will be reevaluated by psychiatry in the a.m. on 08/22/2022.  Lexapro 20 mg daily and Protonix 40 mg daily which her home medications for patient were restarted.  Patient denies having any type of allergic reaction to these medications.  Ardis Hughs, NP 08/21/2022 1:01 PM

## 2022-08-21 NOTE — ED Notes (Signed)
Patient was seen beating her hand on the wall and was asked to stop from the tech. Nurse notified and got pt to stop.

## 2022-08-21 NOTE — ED Notes (Signed)
Pt expressed chest pain

## 2022-08-22 ENCOUNTER — Encounter (HOSPITAL_COMMUNITY): Payer: Self-pay

## 2022-08-22 ENCOUNTER — Other Ambulatory Visit: Payer: Self-pay

## 2022-08-22 ENCOUNTER — Inpatient Hospital Stay (HOSPITAL_COMMUNITY): Payer: Medicare PPO

## 2022-08-22 ENCOUNTER — Inpatient Hospital Stay (HOSPITAL_COMMUNITY)
Admission: EM | Admit: 2022-08-22 | Discharge: 2022-10-19 | DRG: 917 | Disposition: A | Discharge status: Other Acute Inpatient Hospital | Payer: Medicare PPO | Attending: Internal Medicine | Admitting: Internal Medicine

## 2022-08-22 DIAGNOSIS — G40909 Epilepsy, unspecified, not intractable, without status epilepticus: Secondary | ICD-10-CM | POA: Diagnosis present

## 2022-08-22 DIAGNOSIS — R45850 Homicidal ideations: Secondary | ICD-10-CM | POA: Diagnosis present

## 2022-08-22 DIAGNOSIS — Z9151 Personal history of suicidal behavior: Secondary | ICD-10-CM

## 2022-08-22 DIAGNOSIS — G250 Essential tremor: Secondary | ICD-10-CM | POA: Diagnosis present

## 2022-08-22 DIAGNOSIS — E876 Hypokalemia: Secondary | ICD-10-CM | POA: Diagnosis present

## 2022-08-22 DIAGNOSIS — G9341 Metabolic encephalopathy: Secondary | ICD-10-CM | POA: Diagnosis present

## 2022-08-22 DIAGNOSIS — J4520 Mild intermittent asthma, uncomplicated: Secondary | ICD-10-CM | POA: Diagnosis not present

## 2022-08-22 DIAGNOSIS — I959 Hypotension, unspecified: Secondary | ICD-10-CM | POA: Diagnosis present

## 2022-08-22 DIAGNOSIS — J45909 Unspecified asthma, uncomplicated: Secondary | ICD-10-CM | POA: Diagnosis present

## 2022-08-22 DIAGNOSIS — Z87891 Personal history of nicotine dependence: Secondary | ICD-10-CM

## 2022-08-22 DIAGNOSIS — E872 Acidosis, unspecified: Secondary | ICD-10-CM | POA: Diagnosis present

## 2022-08-22 DIAGNOSIS — Z6837 Body mass index (BMI) 37.0-37.9, adult: Secondary | ICD-10-CM | POA: Diagnosis not present

## 2022-08-22 DIAGNOSIS — T50901A Poisoning by unspecified drugs, medicaments and biological substances, accidental (unintentional), initial encounter: Secondary | ICD-10-CM | POA: Diagnosis present

## 2022-08-22 DIAGNOSIS — R45851 Suicidal ideations: Secondary | ICD-10-CM | POA: Diagnosis not present

## 2022-08-22 DIAGNOSIS — F315 Bipolar disorder, current episode depressed, severe, with psychotic features: Secondary | ICD-10-CM | POA: Diagnosis not present

## 2022-08-22 DIAGNOSIS — G4730 Sleep apnea, unspecified: Secondary | ICD-10-CM | POA: Diagnosis present

## 2022-08-22 DIAGNOSIS — T391X1A Poisoning by 4-Aminophenol derivatives, accidental (unintentional), initial encounter: Secondary | ICD-10-CM | POA: Diagnosis present

## 2022-08-22 DIAGNOSIS — E8809 Other disorders of plasma-protein metabolism, not elsewhere classified: Secondary | ICD-10-CM | POA: Diagnosis not present

## 2022-08-22 DIAGNOSIS — F4310 Post-traumatic stress disorder, unspecified: Secondary | ICD-10-CM | POA: Diagnosis present

## 2022-08-22 DIAGNOSIS — E669 Obesity, unspecified: Secondary | ICD-10-CM | POA: Diagnosis present

## 2022-08-22 DIAGNOSIS — F333 Major depressive disorder, recurrent, severe with psychotic symptoms: Secondary | ICD-10-CM | POA: Diagnosis not present

## 2022-08-22 DIAGNOSIS — E871 Hypo-osmolality and hyponatremia: Secondary | ICD-10-CM | POA: Diagnosis present

## 2022-08-22 DIAGNOSIS — Z79899 Other long term (current) drug therapy: Secondary | ICD-10-CM

## 2022-08-22 DIAGNOSIS — Z818 Family history of other mental and behavioral disorders: Secondary | ICD-10-CM

## 2022-08-22 DIAGNOSIS — J9601 Acute respiratory failure with hypoxia: Secondary | ICD-10-CM

## 2022-08-22 DIAGNOSIS — T50912A Poisoning by multiple unspecified drugs, medicaments and biological substances, intentional self-harm, initial encounter: Secondary | ICD-10-CM | POA: Diagnosis not present

## 2022-08-22 DIAGNOSIS — F4320 Adjustment disorder, unspecified: Secondary | ICD-10-CM | POA: Diagnosis present

## 2022-08-22 DIAGNOSIS — R300 Dysuria: Secondary | ICD-10-CM | POA: Diagnosis present

## 2022-08-22 DIAGNOSIS — T50902A Poisoning by unspecified drugs, medicaments and biological substances, intentional self-harm, initial encounter: Principal | ICD-10-CM | POA: Diagnosis present

## 2022-08-22 DIAGNOSIS — T50992A Poisoning by other drugs, medicaments and biological substances, intentional self-harm, initial encounter: Secondary | ICD-10-CM | POA: Diagnosis not present

## 2022-08-22 DIAGNOSIS — T1491XA Suicide attempt, initial encounter: Secondary | ICD-10-CM | POA: Diagnosis present

## 2022-08-22 DIAGNOSIS — Z148 Genetic carrier of other disease: Secondary | ICD-10-CM

## 2022-08-22 DIAGNOSIS — F411 Generalized anxiety disorder: Secondary | ICD-10-CM | POA: Diagnosis present

## 2022-08-22 DIAGNOSIS — K219 Gastro-esophageal reflux disease without esophagitis: Secondary | ICD-10-CM | POA: Diagnosis present

## 2022-08-22 DIAGNOSIS — F845 Asperger's syndrome: Secondary | ICD-10-CM | POA: Diagnosis present

## 2022-08-22 DIAGNOSIS — Q992 Fragile X chromosome: Secondary | ICD-10-CM

## 2022-08-22 DIAGNOSIS — F603 Borderline personality disorder: Secondary | ICD-10-CM | POA: Diagnosis present

## 2022-08-22 DIAGNOSIS — Z9152 Personal history of nonsuicidal self-harm: Secondary | ICD-10-CM

## 2022-08-22 DIAGNOSIS — F250 Schizoaffective disorder, bipolar type: Secondary | ICD-10-CM | POA: Diagnosis present

## 2022-08-22 DIAGNOSIS — M797 Fibromyalgia: Secondary | ICD-10-CM | POA: Diagnosis present

## 2022-08-22 DIAGNOSIS — F251 Schizoaffective disorder, depressive type: Secondary | ICD-10-CM | POA: Diagnosis present

## 2022-08-22 DIAGNOSIS — Z888 Allergy status to other drugs, medicaments and biological substances status: Secondary | ICD-10-CM

## 2022-08-22 DIAGNOSIS — J189 Pneumonia, unspecified organism: Secondary | ICD-10-CM | POA: Diagnosis present

## 2022-08-22 DIAGNOSIS — T426X2A Poisoning by other antiepileptic and sedative-hypnotic drugs, intentional self-harm, initial encounter: Principal | ICD-10-CM | POA: Diagnosis present

## 2022-08-22 DIAGNOSIS — F439 Reaction to severe stress, unspecified: Secondary | ICD-10-CM | POA: Diagnosis not present

## 2022-08-22 DIAGNOSIS — I10 Essential (primary) hypertension: Secondary | ICD-10-CM | POA: Diagnosis present

## 2022-08-22 DIAGNOSIS — Z5982 Transportation insecurity: Secondary | ICD-10-CM

## 2022-08-22 DIAGNOSIS — Z886 Allergy status to analgesic agent status: Secondary | ICD-10-CM

## 2022-08-22 DIAGNOSIS — Z91030 Bee allergy status: Secondary | ICD-10-CM

## 2022-08-22 DIAGNOSIS — Z560 Unemployment, unspecified: Secondary | ICD-10-CM | POA: Diagnosis not present

## 2022-08-22 DIAGNOSIS — Z91013 Allergy to seafood: Secondary | ICD-10-CM

## 2022-08-22 DIAGNOSIS — F319 Bipolar disorder, unspecified: Secondary | ICD-10-CM | POA: Diagnosis present

## 2022-08-22 DIAGNOSIS — Z882 Allergy status to sulfonamides status: Secondary | ICD-10-CM

## 2022-08-22 DIAGNOSIS — Z7951 Long term (current) use of inhaled steroids: Secondary | ICD-10-CM

## 2022-08-22 DIAGNOSIS — D509 Iron deficiency anemia, unspecified: Secondary | ICD-10-CM | POA: Diagnosis present

## 2022-08-22 DIAGNOSIS — Z781 Physical restraint status: Secondary | ICD-10-CM

## 2022-08-22 DIAGNOSIS — Z825 Family history of asthma and other chronic lower respiratory diseases: Secondary | ICD-10-CM

## 2022-08-22 LAB — CBC
HCT: 31.8 % — ABNORMAL LOW (ref 36.0–46.0)
Hemoglobin: 9.4 g/dL — ABNORMAL LOW (ref 12.0–15.0)
MCH: 24 pg — ABNORMAL LOW (ref 26.0–34.0)
MCHC: 29.6 g/dL — ABNORMAL LOW (ref 30.0–36.0)
MCV: 81.1 fL (ref 80.0–100.0)
Platelets: 270 10*3/uL (ref 150–400)
RBC: 3.92 MIL/uL (ref 3.87–5.11)
RDW: 14.6 % (ref 11.5–15.5)
WBC: 9 10*3/uL (ref 4.0–10.5)
nRBC: 0 % (ref 0.0–0.2)

## 2022-08-22 LAB — CBC WITH DIFFERENTIAL/PLATELET
Abs Immature Granulocytes: 0.03 10*3/uL (ref 0.00–0.07)
Basophils Absolute: 0.1 10*3/uL (ref 0.0–0.1)
Basophils Relative: 1 %
Eosinophils Absolute: 0.1 10*3/uL (ref 0.0–0.5)
Eosinophils Relative: 1 %
HCT: 34.1 % — ABNORMAL LOW (ref 36.0–46.0)
Hemoglobin: 10.5 g/dL — ABNORMAL LOW (ref 12.0–15.0)
Immature Granulocytes: 0 %
Lymphocytes Relative: 30 %
Lymphs Abs: 2.7 10*3/uL (ref 0.7–4.0)
MCH: 24.8 pg — ABNORMAL LOW (ref 26.0–34.0)
MCHC: 30.8 g/dL (ref 30.0–36.0)
MCV: 80.6 fL (ref 80.0–100.0)
Monocytes Absolute: 0.5 10*3/uL (ref 0.1–1.0)
Monocytes Relative: 6 %
Neutro Abs: 5.4 10*3/uL (ref 1.7–7.7)
Neutrophils Relative %: 62 %
Platelets: 321 10*3/uL (ref 150–400)
RBC: 4.23 MIL/uL (ref 3.87–5.11)
RDW: 14.6 % (ref 11.5–15.5)
WBC: 8.8 10*3/uL (ref 4.0–10.5)
nRBC: 0 % (ref 0.0–0.2)

## 2022-08-22 LAB — CREATININE, SERUM
Creatinine, Ser: 0.66 mg/dL (ref 0.44–1.00)
GFR, Estimated: >60 mL/min (ref >60)

## 2022-08-22 LAB — SALICYLATE LEVEL: Salicylate Lvl: <7 mg/dL — ABNORMAL LOW (ref 7.0–30.0)

## 2022-08-22 LAB — ACETAMINOPHEN LEVEL: Acetaminophen (Tylenol), Serum: <10 ug/mL — ABNORMAL LOW (ref 10–30)

## 2022-08-22 LAB — POCT I-STAT 7, (LYTES, BLD GAS, ICA,H+H)
Acid-base deficit: 7 mmol/L — ABNORMAL HIGH (ref 0.0–2.0)
Bicarbonate: 20.1 mmol/L (ref 20.0–28.0)
Calcium, Ion: 1.26 mmol/L (ref 1.15–1.40)
HCT: 31 % — ABNORMAL LOW (ref 36.0–46.0)
Hemoglobin: 10.5 g/dL — ABNORMAL LOW (ref 12.0–15.0)
O2 Saturation: 100 %
Patient temperature: 98.2
Potassium: 4.3 mmol/L (ref 3.5–5.1)
Sodium: 137 mmol/L (ref 135–145)
TCO2: 21 mmol/L — ABNORMAL LOW (ref 22–32)
pCO2 arterial: 43.1 mmHg (ref 32–48)
pH, Arterial: 7.275 — ABNORMAL LOW (ref 7.35–7.45)
pO2, Arterial: 499 mmHg — ABNORMAL HIGH (ref 83–108)

## 2022-08-22 LAB — COMPREHENSIVE METABOLIC PANEL
ALT: 14 U/L (ref 0–44)
AST: 16 U/L (ref 15–41)
Albumin: 3.5 g/dL (ref 3.5–5.0)
Alkaline Phosphatase: 73 U/L (ref 38–126)
Anion gap: 7 (ref 5–15)
BUN: 11 mg/dL (ref 6–20)
CO2: 21 mmol/L — ABNORMAL LOW (ref 22–32)
Calcium: 8.4 mg/dL — ABNORMAL LOW (ref 8.9–10.3)
Chloride: 105 mmol/L (ref 98–111)
Creatinine, Ser: 0.74 mg/dL (ref 0.44–1.00)
GFR, Estimated: >60 mL/min (ref >60)
Glucose, Bld: 109 mg/dL — ABNORMAL HIGH (ref 70–99)
Potassium: 4 mmol/L (ref 3.5–5.1)
Sodium: 133 mmol/L — ABNORMAL LOW (ref 135–145)
Total Bilirubin: 0.3 mg/dL (ref 0.3–1.2)
Total Protein: 6.7 g/dL (ref 6.5–8.1)

## 2022-08-22 LAB — HIV ANTIBODY (ROUTINE TESTING W REFLEX): HIV Screen 4th Generation wRfx: NONREACTIVE

## 2022-08-22 LAB — MAGNESIUM: Magnesium: 2 mg/dL (ref 1.7–2.4)

## 2022-08-22 LAB — ETHANOL: Alcohol, Ethyl (B): <10 mg/dL (ref <10)

## 2022-08-22 LAB — GLUCOSE, CAPILLARY
Glucose-Capillary: 225 mg/dL — ABNORMAL HIGH (ref 70–99)
Glucose-Capillary: 99 mg/dL (ref 70–99)

## 2022-08-22 LAB — MRSA NEXT GEN BY PCR, NASAL: MRSA by PCR Next Gen: DETECTED — AB

## 2022-08-22 LAB — HCG, QUANTITATIVE, PREGNANCY: hCG, Beta Chain, Quant, S: 1 m[IU]/mL (ref <5)

## 2022-08-22 MED ORDER — NOREPINEPHRINE 4 MG/250ML-% IV SOLN
INTRAVENOUS | Status: AC
  Filled 2022-08-22: qty 250

## 2022-08-22 MED ORDER — DOCUSATE SODIUM 50 MG/5ML PO LIQD
100.0000 mg | Freq: Two times a day (BID) | ORAL | Status: DC
  Administered 2022-08-22: 100 mg
  Filled 2022-08-22: qty 10

## 2022-08-22 MED ORDER — SODIUM CHLORIDE 0.9 % IV BOLUS
1000.0000 mL | Freq: Once | INTRAVENOUS | Status: AC
  Administered 2022-08-22: 1000 mL via INTRAVENOUS

## 2022-08-22 MED ORDER — MIDAZOLAM HCL 2 MG/2ML IJ SOLN: 1.0000 mg | INTRAMUSCULAR | Status: DC | PRN

## 2022-08-22 MED ORDER — FAMOTIDINE 20 MG PO TABS: 20.0000 mg | ORAL_TABLET | Freq: Two times a day (BID) | ORAL | Status: DC

## 2022-08-22 MED ORDER — POLYETHYLENE GLYCOL 3350 17 G PO PACK: 17.0000 g | PACK | Freq: Every day | ORAL | Status: DC

## 2022-08-22 MED ORDER — ENOXAPARIN SODIUM 40 MG/0.4ML IJ SOSY: 40.0000 mg | PREFILLED_SYRINGE | INTRAMUSCULAR | Status: DC

## 2022-08-22 MED ORDER — FENTANYL BOLUS VIA INFUSION: 50.0000 ug | INTRAVENOUS | Status: DC | PRN

## 2022-08-22 MED ORDER — POLYETHYLENE GLYCOL 3350 17 G PO PACK: 17.0000 g | PACK | Freq: Every day | ORAL | Status: DC | PRN

## 2022-08-22 MED ORDER — MIDAZOLAM HCL 2 MG/2ML IJ SOLN
INTRAMUSCULAR | Status: AC
  Filled 2022-08-22: qty 2

## 2022-08-22 MED ORDER — ORAL CARE MOUTH RINSE
15.0000 mL | OROMUCOSAL | Status: DC
  Administered 2022-08-22 – 2022-08-23 (×7): 15 mL via OROMUCOSAL

## 2022-08-22 MED ORDER — DOCUSATE SODIUM 100 MG PO CAPS: 100.0000 mg | ORAL_CAPSULE | Freq: Two times a day (BID) | ORAL | Status: DC | PRN

## 2022-08-22 MED ORDER — GLUCAGON HCL RDNA (DIAGNOSTIC) 1 MG IJ SOLR
1.0000 mg/h | INTRAVENOUS | Status: DC
  Administered 2022-08-22 – 2022-08-24 (×8): 1 mg/h via INTRAVENOUS
  Filled 2022-08-22 (×13): qty 5

## 2022-08-22 MED ORDER — FENTANYL CITRATE PF 50 MCG/ML IJ SOSY
PREFILLED_SYRINGE | INTRAMUSCULAR | Status: AC
  Administered 2022-08-22: 50 ug via INTRAVENOUS
  Filled 2022-08-22: qty 2

## 2022-08-22 MED ORDER — ROCURONIUM BROMIDE 10 MG/ML (PF) SYRINGE
PREFILLED_SYRINGE | INTRAVENOUS | Status: AC
  Administered 2022-08-22: 60 mg
  Filled 2022-08-22: qty 10

## 2022-08-22 MED ORDER — GLUCAGON HCL RDNA (DIAGNOSTIC) 1 MG IJ SOLR
5.0000 mg | Freq: Once | INTRAVENOUS | Status: AC
  Administered 2022-08-22: 5 mg via INTRAVENOUS
  Filled 2022-08-22: qty 5

## 2022-08-22 MED ORDER — PHENYLEPHRINE 80 MCG/ML (10ML) SYRINGE FOR IV PUSH (FOR BLOOD PRESSURE SUPPORT)
PREFILLED_SYRINGE | INTRAVENOUS | Status: AC
  Filled 2022-08-22: qty 10

## 2022-08-22 MED ORDER — NOREPINEPHRINE 4 MG/250ML-% IV SOLN
2.0000 ug/min | INTRAVENOUS | Status: DC
  Administered 2022-08-22: 6 ug/min via INTRAVENOUS
  Administered 2022-08-22: 2 ug/min via INTRAVENOUS
  Administered 2022-08-22: 10 ug/min via INTRAVENOUS
  Administered 2022-08-24: 2 ug/min via INTRAVENOUS
  Filled 2022-08-22 (×2): qty 250

## 2022-08-22 MED ORDER — ETOMIDATE 2 MG/ML IV SOLN
INTRAVENOUS | Status: AC
  Administered 2022-08-22: 20 mg
  Filled 2022-08-22: qty 20

## 2022-08-22 MED ORDER — FENTANYL 2500MCG IN NS 250ML (10MCG/ML) PREMIX INFUSION
50.0000 ug/h | INTRAVENOUS | Status: DC
  Administered 2022-08-22: 50 ug/h via INTRAVENOUS
  Filled 2022-08-22: qty 250

## 2022-08-22 MED ORDER — CHLORHEXIDINE GLUCONATE CLOTH 2 % EX PADS
6.0000 | MEDICATED_PAD | Freq: Every day | CUTANEOUS | Status: AC
  Administered 2022-08-23 – 2022-08-25 (×4): 6 via TOPICAL

## 2022-08-22 MED ORDER — FENTANYL CITRATE PF 50 MCG/ML IJ SOSY: 50.0000 ug | PREFILLED_SYRINGE | Freq: Once | INTRAMUSCULAR | Status: AC

## 2022-08-22 MED ORDER — CHLORHEXIDINE GLUCONATE CLOTH 2 % EX PADS
6.0000 | MEDICATED_PAD | Freq: Every day | CUTANEOUS | Status: DC
  Administered 2022-08-22: 6 via TOPICAL

## 2022-08-22 MED ORDER — INSULIN ASPART 100 UNIT/ML IJ SOLN
0.0000 [IU] | INTRAMUSCULAR | Status: DC
  Administered 2022-08-22: 5 [IU] via SUBCUTANEOUS
  Administered 2022-08-23 – 2022-08-24 (×4): 3 [IU] via SUBCUTANEOUS
  Administered 2022-08-24: 8 [IU] via SUBCUTANEOUS

## 2022-08-22 MED ORDER — CHARCOAL ACTIVATED PO LIQD
75.0000 g | Freq: Once | ORAL | Status: AC
  Administered 2022-08-22: 75 g via ORAL
  Filled 2022-08-22: qty 480

## 2022-08-22 MED ORDER — SODIUM CHLORIDE 0.9 % IV SOLN
250.0000 mL | INTRAVENOUS | Status: DC
  Administered 2022-08-23 (×2): 250 mL via INTRAVENOUS

## 2022-08-22 MED ORDER — PROPOFOL 1000 MG/100ML IV EMUL
0.0000 ug/kg/min | INTRAVENOUS | Status: DC
  Administered 2022-08-22: 5 ug/kg/min via INTRAVENOUS
  Filled 2022-08-22: qty 100

## 2022-08-22 MED ORDER — DEXTROSE IN LACTATED RINGERS 5 % IV SOLN: INTRAVENOUS | Status: DC

## 2022-08-22 MED ORDER — MUPIROCIN 2 % EX OINT
1.0000 | TOPICAL_OINTMENT | Freq: Two times a day (BID) | CUTANEOUS | Status: AC
  Administered 2022-08-22 – 2022-08-26 (×8): 1 via NASAL
  Filled 2022-08-22: qty 22

## 2022-08-22 MED ORDER — FAMOTIDINE 20 MG PO TABS
20.0000 mg | ORAL_TABLET | Freq: Two times a day (BID) | ORAL | Status: DC
  Administered 2022-08-22: 20 mg
  Filled 2022-08-22: qty 1

## 2022-08-22 MED ORDER — ORAL CARE MOUTH RINSE: 15.0000 mL | OROMUCOSAL | Status: DC | PRN

## 2022-08-22 MED ORDER — SUCCINYLCHOLINE CHLORIDE 200 MG/10ML IV SOSY
PREFILLED_SYRINGE | INTRAVENOUS | Status: AC
  Filled 2022-08-22: qty 10

## 2022-08-22 NOTE — H&P (Signed)
NAME:  [name], MRN:  [id number], DOB:  14-Aug-1990, LOS: 0 ADMISSION DATE:  08/22/2022, CONSULTATION DATE:  08/22/22 REFERRING MD:  Tegeler, CHIEF COMPLAINT:  overdose   History of Present Illness:  [name] is a 32 y.o. F with PMH significant for adjustment disorder with mixed anxiety and depressed mood, autism, bipolar 1 disorder, fragile x syndrome, seizures, schizoaffective disorder and multiple suicide attempts in the last year who presented to the ED after ingesting numerous medications in a suicide attempt.  She reported taking #30 metoprolol 25mg  tablets, #60 Hydroxyzine 25mg , #7 Lexapro 20mg , #21 Gabapentin 600mg , #7 Flexeril 10mg , #7 protonix 40mg  and #7 Prazosin 1mg .   She was slightly bradycardic and hypotensive on EMS arrival which improved with fluids.  Poison control was contacted and as she was within 2hrs of ingestion was given activated charcoal.  Poison control also recommended ICU admission for close Qtc and electrolyte monitoring and possible pressors.  At the time of admission, pt was somnolent but arousable to voice and following commands.  Pertinent  Medical History   has a past medical history of Acid reflux, Adjustment disorder with mixed anxiety and depressed mood (01/31/2022), Adjustment disorder with mixed disturbance of emotions and conduct (08/03/2019), Anxiety, Asthma, Autism, Bipolar 1 disorder, depressed, severe (HCC) (07/25/2021), Carrier of fragile X syndrome, Chronic constipation, Depression, Drug-seeking behavior, Essential tremor, Headache, Ineffective individual coping (05/16/2022), Insomnia (01/12/2022), Intentional drug overdose (HCC) (06/05/2022), Neuromuscular disorder (HCC), Normocytic anemia (06/05/2022), Overdose (07/22/2017), Overdose of acetaminophen (07/2017), Overdose, intentional self-harm, initial encounter (HCC) (07/20/2021), Paranoia (HCC) (04/22/2021), Personality disorder (HCC), Purposeful non-suicidal drug ingestion (HCC)  (06/27/2021), Schizo-affective psychosis (HCC), Schizoaffective disorder (HCC) (07/29/2022), Schizoaffective disorder, bipolar type (HCC), Seizures (HCC), Skin erythema (04/27/2022), Sleep apnea, Suicidal behavior (07/25/2021), Suicidal ideation, Suicide (HCC) (07/01/2021), and Suicide attempt (HCC) (07/04/2021).   Significant Hospital Events: Including procedures, antibiotic start and stop dates in addition to other pertinent events   7/17 admit for intentional overdose   Interim History / Subjective:  MAPs between 65-70, sleeping but arousable and protecting her airway  Objective   Blood pressure (!) 95/59, pulse 70, temperature 98.2 F (36.8 C), temperature source Oral, resp. rate 16, height 5\' 5"  (1.651 m), weight 100.2 kg, last menstrual period 08/08/2022, SpO2 99%.       No intake or output data in the 24 hours ending 08/22/22 1452 Filed Weights   08/22/22 1352  Weight: 100.2 kg   General:  obese F, sleeping in bed HEENT: MM pink/moist, pupils equal Neuro: somnolent, arousable to voice and moving all extremities to command, does not answer questions  CV: s1s2 rrr, no m/r/g PULM:  clear bilaterally on room air without distress GI: soft, non-tender  Extremities: warm/dry, no edema  Skin: no rashes or lesions  Resolved Hospital Problem list     Assessment & Plan:     Intentional overdose  Suicidal Ideation Anxiety and Depression, Schizoaffective Disorder, Adjustment Disorder Multiple medications including #30 metoprolol 25mg  tablets, #60 Hydroxyzine 25mg , #7 Lexapro 20mg , #21 Gabapentin 600mg , #7 Flexeril 10mg , #7 protonix 40mg  and #7 Prazosin 1mg  Denies ETOH or illicit drug use -admit to ICU -received activated charcoal and 1L IVF -closely monitor for hypotension and prolonged Qtc, treat with Mag and K if prolonged -if develops hypotension not responsive to IVF then start levophed, followed by vaso then epi and high dose insulin -placed under IVC in the ED -inpatient  psychiatry consult   Best Practice (right click and "Reselect all SmartList Selections" daily)   Diet/type:  NPO DVT prophylaxis: LMWH GI prophylaxis: N/A Lines: N/A Foley:  N/A Code Status:  full code Last date of multidisciplinary goals of care discussion [pending]  Labs   CBC: Recent Labs  Lab 08/20/22 2040 08/22/22 1420  WBC 8.2 8.8  NEUTROABS 5.2 5.4  HGB 11.9* 10.5*  HCT 38.5 34.1*  MCV 80.2 80.6  PLT 372 321    Basic Metabolic Panel: Recent Labs  Lab 08/20/22 2040  NA 138  K 3.6  CL 107  CO2 22  GLUCOSE 90  BUN 10  CREATININE 0.70  CALCIUM 9.3   GFR: Estimated Creatinine Clearance: 117.3 mL/min (by C-G formula based on SCr of 0.7 mg/dL). Recent Labs  Lab 08/20/22 2040 08/22/22 1420  WBC 8.2 8.8    Liver Function Tests: Recent Labs  Lab 08/20/22 2040  AST 20  ALT 19  ALKPHOS 84  BILITOT 0.1*  PROT 7.7  ALBUMIN 3.9   No results for input(s): "LIPASE", "AMYLASE" in the last 168 hours. No results for input(s): "AMMONIA" in the last 168 hours.  ABG    Component Value Date/Time   PHART 7.345 (L) 07/23/2017 0957   PCO2ART 29.7 (L) 07/23/2017 0957   PO2ART 111.0 (H) 07/23/2017 0957   HCO3 30.1 (H) 08/09/2022 1143   TCO2 26 06/05/2022 0331   ACIDBASEDEF 8.0 (H) 07/23/2017 0957   O2SAT 38.3 08/09/2022 1143     Coagulation Profile: No results for input(s): "INR", "PROTIME" in the last 168 hours.  Cardiac Enzymes: No results for input(s): "CKTOTAL", "CKMB", "CKMBINDEX", "TROPONINI" in the last 168 hours.  HbA1C: Hgb A1c MFr Bld  Date/Time Value Ref Range Status  04/20/2022 06:21 AM 5.2 4.8 - 5.6 % Final    Comment:    (NOTE)         Prediabetes: 5.7 - 6.4         Diabetes: >6.4         Glycemic control for adults with diabetes: <7.0   10/07/2021 06:33 AM 5.0 4.8 - 5.6 % Final    Comment:    (NOTE) Pre diabetes:          5.7%-6.4%  Diabetes:              >6.4%  Glycemic control for   <7.0% adults with diabetes     CBG: No  results for input(s): "GLUCAP" in the last 168 hours.  Review of Systems:   Unable to obtain  Past Medical History:  She,  has a past medical history of Acid reflux, Adjustment disorder with mixed anxiety and depressed mood (01/31/2022), Adjustment disorder with mixed disturbance of emotions and conduct (08/03/2019), Anxiety, Asthma, Autism, Bipolar 1 disorder, depressed, severe (HCC) (07/25/2021), Carrier of fragile X syndrome, Chronic constipation, Depression, Drug-seeking behavior, Essential tremor, Headache, Ineffective individual coping (05/16/2022), Insomnia (01/12/2022), Intentional drug overdose (HCC) (06/05/2022), Neuromuscular disorder (HCC), Normocytic anemia (06/05/2022), Overdose (07/22/2017), Overdose of acetaminophen (07/2017), Overdose, intentional self-harm, initial encounter (HCC) (07/20/2021), Paranoia (HCC) (04/22/2021), Personality disorder (HCC), Purposeful non-suicidal drug ingestion (HCC) (06/27/2021), Schizo-affective psychosis (HCC), Schizoaffective disorder (HCC) (07/29/2022), Schizoaffective disorder, bipolar type (HCC), Seizures (HCC), Skin erythema (04/27/2022), Sleep apnea, Suicidal behavior (07/25/2021), Suicidal ideation, Suicide (HCC) (07/01/2021), and Suicide attempt (HCC) (07/04/2021).   Surgical History:   Past Surgical History:  Procedure Laterality Date   MOUTH SURGERY  2009 or 2010     Social History:   reports that she has quit smoking. Her smoking use included cigarettes. She has never used smokeless tobacco. She reports that she does  not drink alcohol and does not use drugs.   Family History:  Her family history includes Asthma in her father; Mental illness in her father; PDD in her brother; Seizures in her brother.   Allergies Allergies  Allergen Reactions   Bee Venom Anaphylaxis   Coconut Flavor Anaphylaxis and Rash   Fish Allergy Anaphylaxis   Geodon [Ziprasidone Hcl] Other (See Comments)    Pt states that this medication causes paralysis of  the mouth.     Haloperidol And Related Other (See Comments)    Pt states that this medication causes paralysis of the mouth, jaw locks up   Lithobid [Lithium] Other (See Comments)    Seizure-like activity    Roxicodone [Oxycodone] Other (See Comments)    Hallucinations    Seroquel [Quetiapine] Other (See Comments)    Severe drowsiness - Pt currently taking 12.5 mg BID, she is ok with this dose   Shellfish Allergy Anaphylaxis   Phenergan [Promethazine Hcl] Other (See Comments)    Chest pain     Prilosec [Omeprazole] Nausea And Vomiting and Other (See Comments)    Pt can take protonix with no problems    Sulfa Antibiotics Other (See Comments)    Chest pain    Tegretol [Carbamazepine] Nausea And Vomiting   Prozac [Fluoxetine] Other (See Comments)    Increased Depression and Suicidal thoughts   Tape Other (See Comments)    Skin tears, can only tolerate paper tape.   Tylenol [Acetaminophen] Rash and Other (See Comments)    Rash on face      Home Medications  Prior to Admission medications   Medication Sig Start Date End Date Taking? Authorizing Provider  benztropine (COGENTIN) 1 MG tablet Take 1 mg by mouth at bedtime. 08/21/22  Yes [provider]  cyclobenzaprine (FLEXERIL) 10 MG tablet Take 10 mg by mouth at bedtime. 05/10/22  Yes [provider]  escitalopram (LEXAPRO) 20 MG tablet Take 1 tablet (20 mg total) by mouth daily. 06/24/22  Yes White, Patrice L, NP  gabapentin (NEURONTIN) 600 MG tablet Take 600 mg by mouth 3 (three) times daily. 06/29/22  Yes [provider]  hydrOXYzine (ATARAX) 25 MG tablet Take 1 tablet (25 mg total) by mouth 3 (three) times daily as needed for anxiety. 06/11/22  Yes Starleen Blue, NP  metoprolol tartrate (LOPRESSOR) 25 MG tablet Take 0.5 tablets (12.5 mg total) by mouth 2 (two) times daily for 20 days. 08/05/22 08/25/22 Yes Cardama, Amadeo Garnet, MD  OLANZapine (ZYPREXA) 10 MG tablet Take 10 mg by mouth at bedtime.   Yes  [provider]  pantoprazole (PROTONIX) 40 MG tablet Take 40 mg by mouth daily before breakfast.   Yes [provider]  prazosin (MINIPRESS) 1 MG capsule Take 1 mg by mouth at bedtime.   Yes [provider]  albuterol (VENTOLIN HFA) 108 (90 Base) MCG/ACT inhaler Inhale 2 puffs into the lungs every 6 (six) hours as needed for wheezing or shortness of breath.    [provider]  diclofenac Sodium (VOLTAREN) 1 % GEL Apply 2 g topically 2 (two) times daily. 06/11/22   Starleen Blue, NP  fluticasone-salmeterol (ADVAIR) 250-50 MCG/ACT AEPB Inhale 1 puff into the lungs 2 (two) times daily.    [provider]     Critical care time: 42 minutes    CRITICAL CARE Performed by: Darcella Gasman Kenden Brandt   Total critical care time: 42 minutes  Critical care time was exclusive of separately billable procedures and treating other  patients.  Critical care was necessary to treat or prevent imminent or life-threatening deterioration.  Critical care was time spent personally by me on the following activities: development of treatment plan with patient and/or surrogate as well as nursing, discussions with consultants, evaluation of patient's response to treatment, examination of patient, obtaining history from patient or surrogate, ordering and performing treatments and interventions, ordering and review of laboratory studies, ordering and review of radiographic studies, pulse oximetry and re-evaluation of patient's condition.   Darcella Gasman Hetvi Shawhan, PA-C [facility] See Amion for pager If no response to pager , please call [phone] until 7pm After 7:00 pm call Elink  [phone]

## 2022-08-22 NOTE — ED Notes (Signed)
Pt is currently sleeping, no distress noted, environmental check complete, will continue to monitor patient for safety.  

## 2022-08-22 NOTE — Progress Notes (Signed)
Patient is stable at present  Adequately ventilated  Hemodynamics stable  May consider starting tube feeding in a.m.  ABG pending  Follow labs

## 2022-08-22 NOTE — ED Provider Notes (Signed)
FBC/OBS ASAP Discharge Summary  Date and Time: 08/22/2022 10:40 AM  Name: [name]  MRN:  [id number]   Discharge Diagnoses:  Final diagnoses:  Suicidal ideation  Ineffective individual coping  Schizoaffective disorder, depressive type (HCC)    Subjective:   Patient feels better this morning. Would like to be discharged home. Loss of her job was the main reason for having suicidal ideations and plan to overdose. Denies SI/HI/AVH currently. Comes into [facility] whenever, she feels like she can't manage symptoms herself and does not want to overdose like she previously did in June. Will use coping strategies such as mindfullness techniques and painting. 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 Feels support by roommate Able to contract to safety. Can arrange close follow-up Friday with [facility] and therapy this upcoming Monday. Compliant with medications and feels they help.   Stay Summary:   The patient was evaluated each day by a clinical provider to ascertain response to treatment. Improvement was noted by the patient's report of decreasing symptoms, improved sleep and appetite, affect, medication tolerance, behavior, and participation in unit programming.  Patient was asked each day to complete a self inventory noting mood, mental status, pain, new symptoms, anxiety and concerns.  The patient's medications were managed with the following directions:  Lexapro 20 mg every day  Prazosin 1 mg at bedtime  Gabapentin 600 mg TID  Hydroxyzine 25 mg TID prn  Zyprexa 10 mg at bedtime   Patient responded well to medication and being in a therapeutic and supportive environment. Positive and appropriate behavior was noted and the patient was motivated for recovery. The patient worked closely with the treatment team and case manager to develop a discharge plan with appropriate goals. Coping skills, problem solving as well as relaxation therapies were also part of the unit  programming.  Total Time spent with patient: 15 minutes  Past Psychiatric History: Autism, Borderline Personality disorder, Schizoaffective, bipolar type, PTSD, Suicidal attempts and ideations, AD Past Medical History: Sleep apnea, tachycardia,  fibromyalagia, Asthma Family History: N/a Family Psychiatric History: N/a  Social History: Recently unemplyed. Has college education. Lives with a roommate. Has an 47 y.o. daughter, through a previous marriage. Daughter stays with paternal grandmother.   Tobacco Cessation:  N/A, patient does not currently use tobacco products  Current Medications:  Current Facility-Administered Medications  Medication Dose Route Frequency Provider Last Rate Last Admin   alum & mag hydroxide-simeth (MAALOX/MYLANTA) 200-200-20 MG/5ML suspension 30 mL  30 mL Oral Q4H PRN Onuoha, Chinwendu V, NP       cyclobenzaprine (FLEXERIL) tablet 10 mg  10 mg Oral QHS Onuoha, Chinwendu V, NP   10 mg at 08/21/22 2124   escitalopram (LEXAPRO) tablet 20 mg  20 mg Oral Daily Vernard Gambles H, NP   20 mg at 08/22/22 0910   gabapentin (NEURONTIN) capsule 600 mg  600 mg Oral TID Onuoha, Chinwendu V, NP   600 mg at 08/22/22 0910   hydrOXYzine (ATARAX) tablet 25 mg  25 mg Oral TID PRN Onuoha, Chinwendu V, NP   25 mg at 08/21/22 1332   ibuprofen (ADVIL) tablet 400 mg  400 mg Oral Q6H PRN Onuoha, Chinwendu V, NP   400 mg at 08/21/22 2134   LORazepam (ATIVAN) injection 1 mg  1 mg Intramuscular Once Onuoha, Chinwendu V, NP       magnesium hydroxide (MILK OF MAGNESIA) suspension 30 mL  30 mL Oral Daily PRN Onuoha, Chinwendu V, NP   30 mL at 08/21/22  2303   metoprolol tartrate (LOPRESSOR) tablet 12.5 mg  12.5 mg Oral Daily Onuoha, Chinwendu V, NP   12.5 mg at 08/22/22 0910   OLANZapine (ZYPREXA) tablet 10 mg  10 mg Oral QHS Onuoha, Chinwendu V, NP   10 mg at 08/21/22 2124   OLANZapine zydis (ZYPREXA) disintegrating tablet 10 mg  10 mg Oral Q8H PRN Sindy Guadeloupe, NP       pantoprazole (PROTONIX)  EC tablet 40 mg  40 mg Oral QAC breakfast Ardis Hughs, NP   40 mg at 08/22/22 0850   polyethylene glycol (MIRALAX / GLYCOLAX) packet 17 g  17 g Oral Daily PRN Ardis Hughs, NP   17 g at 08/21/22 1828   prazosin (MINIPRESS) capsule 1 mg  1 mg Oral QHS Onuoha, Chinwendu V, NP   1 mg at 08/21/22 2124   Current Outpatient Medications  Medication Sig Dispense Refill   albuterol (VENTOLIN HFA) 108 (90 Base) MCG/ACT inhaler Inhale 2 puffs into the lungs every 6 (six) hours as needed for wheezing or shortness of breath.     cyclobenzaprine (FLEXERIL) 10 MG tablet Take 10 mg by mouth at bedtime.     diclofenac Sodium (VOLTAREN) 1 % GEL Apply 2 g topically 2 (two) times daily. 2 g 0   escitalopram (LEXAPRO) 20 MG tablet Take 1 tablet (20 mg total) by mouth daily.     fluticasone-salmeterol (ADVAIR) 250-50 MCG/ACT AEPB Inhale 1 puff into the lungs 2 (two) times daily.     gabapentin (NEURONTIN) 600 MG tablet Take 600 mg by mouth 3 (three) times daily.     hydrOXYzine (ATARAX) 25 MG tablet Take 1 tablet (25 mg total) by mouth 3 (three) times daily as needed for anxiety. 30 tablet 0   metoprolol tartrate (LOPRESSOR) 25 MG tablet Take 0.5 tablets (12.5 mg total) by mouth 2 (two) times daily for 20 days. 20 tablet 0   OLANZapine (ZYPREXA) 10 MG tablet Take 10 mg by mouth at bedtime.     pantoprazole (PROTONIX) 40 MG tablet Take 40 mg by mouth daily before breakfast.     prazosin (MINIPRESS) 1 MG capsule Take 1 mg by mouth at bedtime.      PTA Medications:  PTA Medications  Medication Sig   albuterol (VENTOLIN HFA) 108 (90 Base) MCG/ACT inhaler Inhale 2 puffs into the lungs every 6 (six) hours as needed for wheezing or shortness of breath.   cyclobenzaprine (FLEXERIL) 10 MG tablet Take 10 mg by mouth at bedtime.   diclofenac Sodium (VOLTAREN) 1 % GEL Apply 2 g topically 2 (two) times daily.   hydrOXYzine (ATARAX) 25 MG tablet Take 1 tablet (25 mg total) by mouth 3 (three) times daily as needed  for anxiety.   pantoprazole (PROTONIX) 40 MG tablet Take 40 mg by mouth daily before breakfast.   prazosin (MINIPRESS) 1 MG capsule Take 1 mg by mouth at bedtime.   escitalopram (LEXAPRO) 20 MG tablet Take 1 tablet (20 mg total) by mouth daily.   gabapentin (NEURONTIN) 600 MG tablet Take 600 mg by mouth 3 (three) times daily.   metoprolol tartrate (LOPRESSOR) 25 MG tablet Take 0.5 tablets (12.5 mg total) by mouth 2 (two) times daily for 20 days.   OLANZapine (ZYPREXA) 10 MG tablet Take 10 mg by mouth at bedtime.   fluticasone-salmeterol (ADVAIR) 250-50 MCG/ACT AEPB Inhale 1 puff into the lungs 2 (two) times daily.   Facility Ordered Medications  Medication   alum & mag hydroxide-simeth (MAALOX/MYLANTA) [id number]  MG/5ML suspension 30 mL   magnesium hydroxide (MILK OF MAGNESIA) suspension 30 mL   hydrOXYzine (ATARAX) tablet 25 mg   cyclobenzaprine (FLEXERIL) tablet 10 mg   gabapentin (NEURONTIN) capsule 600 mg   prazosin (MINIPRESS) capsule 1 mg   OLANZapine (ZYPREXA) tablet 10 mg   ibuprofen (ADVIL) tablet 400 mg   metoprolol tartrate (LOPRESSOR) tablet 12.5 mg   escitalopram (LEXAPRO) tablet 20 mg   pantoprazole (PROTONIX) EC tablet 40 mg   polyethylene glycol (MIRALAX / GLYCOLAX) packet 17 g   OLANZapine zydis (ZYPREXA) disintegrating tablet 10 mg   And   [COMPLETED] LORazepam (ATIVAN) tablet 1 mg   LORazepam (ATIVAN) injection 1 mg       03/10/2022   10:01 PM 03/10/2022    9:56 PM 02/10/2022    1:49 AM  Depression screen PHQ 2/9  Decreased Interest 1 1 2   Down, Depressed, Hopeless 1 1 2   PHQ - 2 Score 2 2 4   Altered sleeping 1 1 2   Tired, decreased energy 1 1 2   Change in appetite 1 0 2  Feeling bad or failure about yourself  1 1 2   Trouble concentrating 1 1 2   Moving slowly or fidgety/restless 1 1 1   Suicidal thoughts 1 1 1   PHQ-9 Score 9 8 16   Difficult doing work/chores Somewhat difficult  Very difficult    Flowsheet Row ED from 08/20/2022 in [facility] ED from 08/08/2022 in [facility] Emergency Department at [facility] ED from 08/04/2022 in [facility] Emergency Department at [facility]  C-SSRS RISK CATEGORY High Risk No Risk No Risk       Musculoskeletal  Strength & Muscle Tone: within normal limits Gait & Station: normal Patient leans: N/A  Psychiatric Specialty Exam  Presentation  General Appearance:  Appropriate for Environment  Eye Contact: Fair  Speech: -- (Faster rate, but not pressured)  Speech Volume: Normal  Handedness: Right   Mood and Affect  Mood: Euthymic  Affect: Non-Congruent; Depressed   Thought Process  Thought Processes: Coherent  Descriptions of Associations:Intact  Orientation:Full (Time, Place and Person)  Thought Content:Logical  Diagnosis of Schizophrenia or Schizoaffective disorder in past: Yes  Duration of Psychotic Symptoms: Less than six months   Hallucinations:Hallucinations: None  Ideas of Reference:None  Suicidal Thoughts:Suicidal Thoughts: No, no intent  No thoughts of self harm   Homicidal Thoughts:Homicidal Thoughts: No  Sensorium  Memory: Immediate Good; Recent Good  Judgment: Fair  Insight: Fair   Chartered certified accountant: Fair  Attention Span: Fair  Recall: [employer] of Knowledge: Fair  Language: Fair   Psychomotor Activity  Psychomotor Activity: Psychomotor Activity: Normal   Assets  Assets: Communication Skills; Desire for Improvement; Financial Resources/Insurance; Housing; Vocational/Educational; Social Support   Sleep  Sleep: Sleep: Good   Nutritional Assessment (For OBS and FBC admissions only) Has the patient had a weight loss or gain of 10 pounds or more in the last 3 months?: No Has the patient had a decrease in food intake/or appetite?: No Does the patient have dental problems?: No Does the patient have eating habits or behaviors that may be indicators of an eating disorder  including binging or inducing vomiting?: No Has the patient recently lost weight without trying?: 0 Has the patient been eating poorly because of a decreased appetite?: 0 Malnutrition Screening Tool Score: 0    Physical Exam  Physical Exam Constitutional:      General: She is not in acute distress.  Appearance: She is obese. She is not ill-appearing.  HENT:     Head: Normocephalic.  Eyes:     Conjunctiva/sclera: Conjunctivae normal.  Cardiovascular:     Rate and Rhythm: Normal rate.  Pulmonary:     Effort: Pulmonary effort is normal.  Musculoskeletal:        General: Normal range of motion.     Cervical back: Normal range of motion.  Neurological:     General: No focal deficit present.     Mental Status: She is alert. Mental status is at baseline.  Psychiatric:        Attention and Perception: Attention and perception normal. She does not perceive auditory or visual hallucinations.        Mood and Affect: Mood is not anxious or depressed.        Speech: Speech normal.        Behavior: Behavior is withdrawn. Behavior is not aggressive. Behavior is cooperative.        Thought Content: Thought content is not paranoid or delusional. Thought content does not include homicidal or suicidal ideation. Thought content does not include homicidal or suicidal plan.        Cognition and Memory: Memory normal.        Judgment: Judgment normal.     Comments: Fast speech, but not pressured    Review of Systems  Constitutional:  Negative for chills and fever.  Respiratory:  Negative for cough and hemoptysis.   Cardiovascular:  Negative for chest pain.  Gastrointestinal:  Negative for diarrhea, nausea and vomiting.  Neurological:  Negative for seizures, weakness and headaches.  Psychiatric/Behavioral:  Negative for depression and suicidal ideas. The patient is not nervous/anxious and does not have insomnia.    Blood pressure 114/68, pulse 99, temperature 98.3 F (36.8 C), temperature  source Oral, resp. rate 18, last menstrual period 07/22/2022, SpO2 100%. There is no height or weight on file to calculate BMI.  Demographic Factors:  Divorced or widowed, Caucasian, Low socioeconomic status, and Unemployed  Loss Factors: Decrease in vocational status, Decline in physical health, and Financial problems/change in socioeconomic status  Historical Factors: Prior suicide attempts and Impulsivity  Risk Reduction Factors:   Sense of responsibility to family, Living with another person, especially a relative, Positive social support, and Positive therapeutic relationship  Continued Clinical Symptoms:  Severe Anxiety and/or Agitation Depression:   Hopelessness Impulsivity Schizophrenia:   Less than 59 years old Chronic Pain Previous Psychiatric Diagnoses and Treatments  Cognitive Features That Contribute To Risk:  None    Suicide Risk:  Mild:  Suicidal ideation of limited frequency, intensity, duration, and specificity.  There are no identifiable plans, no associated intent, mild dysphoria and related symptoms, good self-control (both objective and subjective assessment), few other risk factors, and identifiable protective factors, including available and accessible social support.  Plan Of Care/Follow-up recommendations:  [name]  is a 32 y.o.female with a past medical history of schizoaffective, GAD, PTSD, Autism who presented on June 15th for SI w/ an active plan. Upon evaluation today, the patient feels improved and denies SI/SH/HI/AVH. Losing job was the main stressor that caused SI and she plans to pursue other professions now. Is on disability. Is here voluntarily and desires home discharge. She does not meet IVC criteria and does not want to pursue inpatient hospitalization. Contracted for safety. Understands that she can cope better with skills learned att prior admissions. Has close follow-up with outpatient psychiatrist available and therapy session.  Also  lives with a roommate who can monitor. If she decompensates, she will return to [facility]. After safety planning and evaluation, the patient is appropriate to go home for discharge.   - Follow-up with outpatient Psychiatric services for medication management and therapeutic services   Disposition: Home  Peterson Ao, MD 08/22/2022, 10:40 AM

## 2022-08-22 NOTE — ED Provider Notes (Signed)
[place] EMERGENCY DEPARTMENT AT [facility] Provider Note   CSN: [id number] Arrival date & time: 08/22/22  1343     History  Chief Complaint  Patient presents with   Drug Overdose    [name] is a 32 y.o. female.  The history is provided by the patient, medical records and the EMS personnel. No language interpreter was used.  Drug Overdose This is a new problem. The current episode started 1 to 2 hours ago. The problem has not changed since onset.Pertinent negatives include no chest pain, no abdominal pain, no headaches and no shortness of breath. Nothing aggravates the symptoms. Nothing relieves the symptoms. She has tried nothing for the symptoms. The treatment provided no relief.         Home Medications Prior to Admission medications   Medication Sig Start Date End Date Taking? Authorizing Provider  albuterol (VENTOLIN HFA) 108 (90 Base) MCG/ACT inhaler Inhale 2 puffs into the lungs every 6 (six) hours as needed for wheezing or shortness of breath.    [provider]  cyclobenzaprine (FLEXERIL) 10 MG tablet Take 10 mg by mouth at bedtime. 05/10/22   [provider]  diclofenac Sodium (VOLTAREN) 1 % GEL Apply 2 g topically 2 (two) times daily. 06/11/22   Starleen Blue, NP  escitalopram (LEXAPRO) 20 MG tablet Take 1 tablet (20 mg total) by mouth daily. 06/24/22   White, Patrice L, NP  fluticasone-salmeterol (ADVAIR) 250-50 MCG/ACT AEPB Inhale 1 puff into the lungs 2 (two) times daily.    [provider]  gabapentin (NEURONTIN) 600 MG tablet Take 600 mg by mouth 3 (three) times daily. 06/29/22   [provider]  hydrOXYzine (ATARAX) 25 MG tablet Take 1 tablet (25 mg total) by mouth 3 (three) times daily as needed for anxiety. 06/11/22   Starleen Blue, NP  metoprolol tartrate (LOPRESSOR) 25 MG tablet Take 0.5 tablets (12.5 mg total) by mouth 2 (two) times daily for 20 days. 08/05/22 08/25/22  Nira Conn, MD   OLANZapine (ZYPREXA) 10 MG tablet Take 10 mg by mouth at bedtime.    [provider]  pantoprazole (PROTONIX) 40 MG tablet Take 40 mg by mouth daily before breakfast.    [provider]  prazosin (MINIPRESS) 1 MG capsule Take 1 mg by mouth at bedtime.    [provider]      Allergies    Bee venom, Coconut flavor, Fish allergy, Geodon [ziprasidone hcl], Haloperidol and related, Lithobid [lithium], Roxicodone [oxycodone], Seroquel [quetiapine], Shellfish allergy, Phenergan [promethazine hcl], Prilosec [omeprazole], Sulfa antibiotics, Tegretol [carbamazepine], Prozac [fluoxetine], Tape, and Tylenol [acetaminophen]    Review of Systems   Review of Systems  Constitutional:  Positive for fatigue. Negative for chills.  HENT:  Negative for congestion.   Eyes:  Negative for visual disturbance.  Respiratory:  Negative for cough, chest tightness, shortness of breath and wheezing.   Cardiovascular:  Negative for chest pain and palpitations.  Gastrointestinal:  Negative for abdominal pain, constipation, diarrhea, nausea and vomiting.  Genitourinary:  Negative for dysuria and flank pain.  Musculoskeletal:  Negative for back pain, neck pain and neck stiffness.  Skin:  Negative for rash and wound.  Neurological:  Positive for light-headedness. Negative for dizziness, syncope, weakness and headaches.  Psychiatric/Behavioral:  Negative for agitation and confusion.   All other systems reviewed and are negative.   Physical Exam Updated Vital Signs BP (!) 95/59 (BP Location: Right Arm)   Pulse 70   Temp 98.2 F (  36.8 C) (Oral)   Resp 16   Ht 5\' 5"  (1.651 m)   Wt 100.2 kg   LMP 08/08/2022 (Approximate)   SpO2 99%   BMI 36.78 kg/m  Physical Exam Vitals and nursing note reviewed.  Constitutional:      General: She is not in acute distress.    Appearance: She is well-developed. She is not ill-appearing, toxic-appearing or diaphoretic.  HENT:     Head: Normocephalic and  atraumatic.     Nose: No congestion or rhinorrhea.     Mouth/Throat:     Mouth: Mucous membranes are dry.     Pharynx: No oropharyngeal exudate or posterior oropharyngeal erythema.  Eyes:     Extraocular Movements: Extraocular movements intact.     Conjunctiva/sclera: Conjunctivae normal.     Pupils: Pupils are equal, round, and reactive to light.  Cardiovascular:     Rate and Rhythm: Normal rate and regular rhythm.     Heart sounds: No murmur heard. Pulmonary:     Effort: Pulmonary effort is normal. No respiratory distress.     Breath sounds: Normal breath sounds. No wheezing, rhonchi or rales.  Chest:     Chest wall: No tenderness.  Abdominal:     General: Abdomen is flat.     Palpations: Abdomen is soft.     Tenderness: There is no abdominal tenderness. There is no right CVA tenderness, left CVA tenderness, guarding or rebound.  Musculoskeletal:        General: No swelling or tenderness.     Cervical back: Neck supple.     Right lower leg: No edema.     Left lower leg: No edema.  Skin:    General: Skin is warm and dry.     Capillary Refill: Capillary refill takes less than 2 seconds.     Findings: No erythema or rash.  Neurological:     General: No focal deficit present.     Mental Status: She is alert.  Psychiatric:        Mood and Affect: Mood is depressed.        Thought Content: Thought content includes suicidal ideation. Thought content includes suicidal plan.     Comments: Patient attempted suicide prior to arrival.     ED Results / Procedures / Treatments   Labs (all labs ordered are listed, but only abnormal results are displayed) Labs Reviewed  CBC WITH DIFFERENTIAL/PLATELET - Abnormal; Notable for the following components:      Result Value   Hemoglobin 10.5 (*)    HCT 34.1 (*)    MCH 24.8 (*)    All other components within normal limits  COMPREHENSIVE METABOLIC PANEL  ETHANOL  MAGNESIUM  SALICYLATE LEVEL  ACETAMINOPHEN LEVEL  HCG, QUANTITATIVE,  PREGNANCY    EKG EKG Interpretation Date/Time:  Wednesday August 22 2022 13:50:11 EDT Ventricular Rate:  71 PR Interval:  137 QRS Duration:  91 QT Interval:  425 QTC Calculation: 462 R Axis:   53  Text Interpretation: Sinus rhythm when compared to prior, similar appearance. No STEMI Confirmed by Theda Belfast ([id number]) on 08/22/2022 2:06:35 PM  Radiology No results found.  Procedures Procedures    CRITICAL CARE Performed by: Canary Brim Arlin Savona Total critical care time: 50 minutes Critical care time was exclusive of separately billable procedures and treating other patients. Critical care was necessary to treat or prevent imminent or life-threatening deterioration. Critical care was time spent personally by me on the following activities: development of treatment plan with patient  and/or surrogate as well as nursing, discussions with consultants, evaluation of patient's response to treatment, examination of patient, obtaining history from patient or surrogate, ordering and performing treatments and interventions, ordering and review of laboratory studies, ordering and review of radiographic studies, pulse oximetry and re-evaluation of patient's condition.  Medications Ordered in ED Medications  sodium chloride 0.9 % bolus 1,000 mL (1,000 mLs Intravenous New Bag/Given 08/22/22 1423)  charcoal activated (NO SORBITOL) (ACTIDOSE-AQUA) suspension 75 g (75 g Oral Given 08/22/22 1413)    ED Course/ Medical Decision Making/ A&P                             Medical Decision Making Amount and/or Complexity of Data Reviewed Labs: ordered.  Risk OTC drugs. Decision regarding hospitalization.    Ellesse Antenucci is a 32 y.o. female with a past medical history significant for anxiety, depression, schizoaffective disorder, seizure disorder, asthma, migraines, PTSD, borderline personality disorder who presents for intentional overdose.  According to patient and EMS, patient took  all of her home medications including 30 tablets of metoprolol 25 mg, 60 tablets of hydroxyzine 25 mg, 7 Lexapro 20 mg, 21 gabapentin 600 mg, 7 cyclobenzaprine 10 mg, 7 pantoprazole 40 mg, and 7 prazosin 1 mg.  She also took several x-rays that she had of multiple but does not remember which.  She denied alcohol use or other drug use.  According to EMS, she was slightly bradycardic and was hypotensive en route.  On arrival blood pressures in the 90s.  She is feeling lightheaded and fatigued but denies any chest pain palpitations or shortness of breath.  She reports she was [facility] and was discharged home.  She reports she took all the medications at her disposal which she filled yesterday.  She reports she is suicidal and try to kill herself.  Otherwise she is denying any other physical complaints other than fatigue.  I quickly called poison control who recommended activated charcoal as she took all these medications around 12:30 PM, within the last 2 hours.  Her EKG did not show STEMI and QTc was not prolonged.  Poison control feel she needs ICU admission given all of the medications she took.  They wanted [name] to watch her QTc to make sure it does not go over 500.  They want [name] to treat you with magnesium and/or potassium if that starts to change.  They felt that if her pressure not respond to fluids she would need pressors including Levophed followed by vasopressin followed by epi followed by high-dose insulin.   Critical care was called he will come see to admit the patient.  I will place her under IVC.  Patient will be admitted by ICU for further management of intentional overdose.           Final Clinical Impression(s) / ED Diagnoses Final diagnoses:  Intentional overdose, initial encounter (HCC)  Suicide attempt (HCC)    Clinical Impression: 1. Intentional overdose, initial encounter (HCC)   2. Suicide attempt [facility])     Disposition:  Admit  This note was prepared with assistance of Dragon voice recognition software. Occasional wrong-word or sound-a-like substitutions may have occurred due to the inherent limitations of voice recognition software.     Kilo Eshelman, Canary Brim, MD 08/22/22 1600

## 2022-08-22 NOTE — Procedures (Signed)
Arterial Catheter Insertion Procedure Note  [name]  [id number]  02/15/1990  Date:08/22/22  Time:5:46 PM    Provider Performing: Tomma Lightning    Procedure: Insertion of Arterial Line (78295) with US guidance (62130)   Indication(s) Blood pressure monitoring and/or need for frequent ABGs  Consent Unable to obtain consent due to emergent nature of procedure.  Anesthesia None   Time Out Verified patient identification, verified procedure, site/side was marked, verified correct patient position, special equipment/implants available, medications/allergies/relevant history reviewed, required imaging and test results available.   Sterile Technique Maximal sterile technique including full sterile barrier drape, hand hygiene, sterile gown, sterile gloves, mask, hair covering, sterile ultrasound probe cover (if used).   Procedure Description Area of catheter insertion was cleaned with chlorhexidine and draped in sterile fashion. With real-time ultrasound guidance an arterial catheter was placed into the right radial artery.  Appropriate arterial tracings confirmed on monitor.     Complications/Tolerance None; patient tolerated the procedure well.   EBL Minimal   Specimen(s) None

## 2022-08-22 NOTE — Procedures (Signed)
Intubation Procedure Note  Tyrihanna Wingert  [id number]  1990/11/03  Date:08/22/22  Time:5:44 PM   Provider Performing:Journi Moffa A Jairus Tonne    Procedure: Intubation (31500)  Indication(s) Respiratory Failure  Consent Unable to obtain consent due to emergent nature of procedure.   Anesthesia Etomidate, Fentanyl, and Rocuronium 20 etomidate, 50 fentanyl, 50 rocuronium  Time Out Verified patient identification, verified procedure, site/side was marked, verified correct patient position, special equipment/implants available, medications/allergies/relevant history reviewed, required imaging and test results available.   Sterile Technique Usual hand hygeine, masks, and gloves were used   Procedure Description Patient positioned in bed supine.  Sedation given as noted above.  Patient was intubated with endotracheal tube using Glidescope.  View was Grade 1 full glottis .  Number of attempts was 1.  Colorimetric CO2 detector was consistent with tracheal placement.   Complications/Tolerance None; patient tolerated the procedure well. Chest X-ray is ordered to verify placement.   EBL Minimal   Specimen(s) None

## 2022-08-22 NOTE — ED Notes (Signed)
Patient resting quietly in bed with eyes closed. Respirations equal and unlabored, skin warm and dry, NAD. Routine safety checks conducted according to facility protocol. Will continue to monitor for safety.  

## 2022-08-22 NOTE — ED Notes (Signed)
Patient A&Ox4. Patient denies Si/Hi and AVH. Patient denies any physical complaints when asked. No acute distress noted. Support and encouragement provided. Routine safety checks conducted according to facility protocol. Encouraged patient to notify staff if thoughts of harm toward self or others arise. Patient verbalize understanding and agreement. Will continue to monitor for safety.

## 2022-08-22 NOTE — ED Notes (Signed)
Patient A&O x 4, ambulatory. Patient discharged in no acute distress. Patient denied SI/HI, A/VH upon discharge. Patient verbalized understanding of all discharge instructions explained by staff, to include follow up appointments, and safety plan. Patient reported mood 10/10.  Pt belongings returned to patient from locker # 17 intact. Patient escorted to lobby via staff for transport to destination. Safety maintained.

## 2022-08-22 NOTE — Progress Notes (Signed)
Made attempts to contact patient's listed contact Rosalia Hammers  [phone]   [facility] to generic voicemail

## 2022-08-22 NOTE — Progress Notes (Signed)
Arrived to patient's room. Patient being prepped for CVC line to be placed. Nurse confirmed with this nurse no need for 2nd PIV at this time. Tomasita Morrow, RN VAST

## 2022-08-22 NOTE — Procedures (Signed)
Orogastric tube placed without difficulty

## 2022-08-22 NOTE — Discharge Instructions (Signed)
Follow-up recommendations:  Activity:  Normal, as tolerated Diet:  Per PCP recommendation  Patient is instructed prior to discharge to: Take all medications as prescribed by her mental healthcare provider. Report any adverse effects and/or reactions from the medicines to her outpatient provider promptly. Patient has been instructed & cautioned: To not engage in alcohol and or illegal drug use while on prescription medicines.  In the event of worsening symptoms, patient is instructed to call the crisis hotline at 988, 911 and or go to the nearest ED for appropriate evaluation and treatment of symptoms. To follow-up with her primary care provider for your other medical issues, concerns and or health care needs.  

## 2022-08-22 NOTE — Progress Notes (Signed)
Pt's emergency contact Fayrene Fearing updated with pt's current status.  Darcella Gasman Meredith Mells, PA-C\

## 2022-08-22 NOTE — ED Triage Notes (Signed)
Per EMS and pt report, pt has taken all of her prescription meds that she filled yesterday. They include beta-blockers and other BP meds. Pt was seen at [facility] on Mon and was sent home. She lost her job today and took all of her meds. Vomited once, but only had 4 undigested pills. This is pt's 9th attempt this year.

## 2022-08-22 NOTE — Procedures (Signed)
Central Venous Catheter Insertion Procedure Note  [name]  [id number]  1990/07/05  Date:08/22/22  Time:5:09 PM   Provider Performing:Shams Fill Salena Saner Katrinka Blazing   Procedure: Insertion of Non-tunneled Central Venous Catheter(36556) with US guidance (40981)   Indication(s) Medication administration  Consent Unable to obtain consent due to emergent nature of procedure.  Anesthesia Topical only with 1% lidocaine   Timeout Verified patient identification, verified procedure, site/side was marked, verified correct patient position, special equipment/implants available, medications/allergies/relevant history reviewed, required imaging and test results available.  Sterile Technique Maximal sterile technique including full sterile barrier drape, hand hygiene, sterile gown, sterile gloves, mask, hair covering, sterile ultrasound probe cover (if used).  Procedure Description Area of catheter insertion was cleaned with chlorhexidine and draped in sterile fashion.  With real-time ultrasound guidance a central venous catheter was placed into the left internal jugular vein. Nonpulsatile blood flow and easy flushing noted in all ports.  The catheter was sutured in place and sterile dressing applied.  Complications/Tolerance None; patient tolerated the procedure well. Chest X-ray is ordered to verify placement for internal jugular or subclavian cannulation.   Chest x-ray is not ordered for femoral cannulation.  EBL Minimal  Specimen(s) None

## 2022-08-22 NOTE — Progress Notes (Signed)
Chest x-ray reviewed  Central venous access catheter appropriately placed  No pneumothorax  Endotracheal tube was deep in the right main, was withdrawn now 20 cm at the incisors-chest x-ray confirms tube above carina  OG tube below diaphragm

## 2022-08-23 DIAGNOSIS — T50902A Poisoning by unspecified drugs, medicaments and biological substances, intentional self-harm, initial encounter: Secondary | ICD-10-CM | POA: Diagnosis not present

## 2022-08-23 LAB — COMPREHENSIVE METABOLIC PANEL
ALT: 14 U/L (ref 0–44)
AST: 17 U/L (ref 15–41)
Albumin: 3 g/dL — ABNORMAL LOW (ref 3.5–5.0)
Alkaline Phosphatase: 67 U/L (ref 38–126)
Anion gap: 7 (ref 5–15)
BUN: 7 mg/dL (ref 6–20)
CO2: 20 mmol/L — ABNORMAL LOW (ref 22–32)
Calcium: 7.9 mg/dL — ABNORMAL LOW (ref 8.9–10.3)
Chloride: 105 mmol/L (ref 98–111)
Creatinine, Ser: 0.54 mg/dL (ref 0.44–1.00)
GFR, Estimated: >60 mL/min (ref >60)
Glucose, Bld: 130 mg/dL — ABNORMAL HIGH (ref 70–99)
Potassium: 3.6 mmol/L (ref 3.5–5.1)
Sodium: 132 mmol/L — ABNORMAL LOW (ref 135–145)
Total Bilirubin: 0.5 mg/dL (ref 0.3–1.2)
Total Protein: 5.9 g/dL — ABNORMAL LOW (ref 6.5–8.1)

## 2022-08-23 LAB — GLUCOSE, CAPILLARY
Glucose-Capillary: 116 mg/dL — ABNORMAL HIGH (ref 70–99)
Glucose-Capillary: 106 mg/dL — ABNORMAL HIGH (ref 70–99)
Glucose-Capillary: 110 mg/dL — ABNORMAL HIGH (ref 70–99)
Glucose-Capillary: 197 mg/dL — ABNORMAL HIGH (ref 70–99)
Glucose-Capillary: 177 mg/dL — ABNORMAL HIGH (ref 70–99)
Glucose-Capillary: 187 mg/dL — ABNORMAL HIGH (ref 70–99)

## 2022-08-23 LAB — BASIC METABOLIC PANEL
Anion gap: 8 (ref 5–15)
BUN: 8 mg/dL (ref 6–20)
CO2: 21 mmol/L — ABNORMAL LOW (ref 22–32)
Calcium: 8.2 mg/dL — ABNORMAL LOW (ref 8.9–10.3)
Chloride: 107 mmol/L (ref 98–111)
Creatinine, Ser: 0.61 mg/dL (ref 0.44–1.00)
GFR, Estimated: >60 mL/min (ref >60)
Glucose, Bld: 188 mg/dL — ABNORMAL HIGH (ref 70–99)
Potassium: 4.1 mmol/L (ref 3.5–5.1)
Sodium: 136 mmol/L (ref 135–145)

## 2022-08-23 LAB — PHOSPHORUS: Phosphorus: 2.7 mg/dL (ref 2.5–4.6)

## 2022-08-23 LAB — MAGNESIUM
Magnesium: 1.9 mg/dL (ref 1.7–2.4)
Magnesium: 1.9 mg/dL (ref 1.7–2.4)

## 2022-08-23 LAB — TRIGLYCERIDES: Triglycerides: 112 mg/dL (ref <150)

## 2022-08-23 LAB — CBC
HCT: 31.2 % — ABNORMAL LOW (ref 36.0–46.0)
Hemoglobin: 9.6 g/dL — ABNORMAL LOW (ref 12.0–15.0)
MCH: 24.1 pg — ABNORMAL LOW (ref 26.0–34.0)
MCHC: 30.8 g/dL (ref 30.0–36.0)
MCV: 78.4 fL — ABNORMAL LOW (ref 80.0–100.0)
Platelets: 305 10*3/uL (ref 150–400)
RBC: 3.98 MIL/uL (ref 3.87–5.11)
RDW: 14.5 % (ref 11.5–15.5)
WBC: 16.1 10*3/uL — ABNORMAL HIGH (ref 4.0–10.5)
nRBC: 0 % (ref 0.0–0.2)

## 2022-08-23 MED ORDER — SODIUM CHLORIDE 0.9 % IV SOLN
2.0000 g | INTRAVENOUS | Status: DC
  Administered 2022-08-23 – 2022-08-25 (×3): 2 g via INTRAVENOUS
  Filled 2022-08-23 (×3): qty 20

## 2022-08-23 MED ORDER — ORAL CARE MOUTH RINSE: 15.0000 mL | OROMUCOSAL | Status: DC | PRN

## 2022-08-23 MED ORDER — FAMOTIDINE 20 MG PO TABS
20.0000 mg | ORAL_TABLET | Freq: Two times a day (BID) | ORAL | Status: DC
  Administered 2022-08-23 – 2022-08-30 (×15): 20 mg via ORAL
  Filled 2022-08-23 (×15): qty 1

## 2022-08-23 MED ORDER — POTASSIUM CHLORIDE 10 MEQ/50ML IV SOLN
10.0000 meq | INTRAVENOUS | Status: AC
  Administered 2022-08-23 (×4): 10 meq via INTRAVENOUS
  Filled 2022-08-23 (×4): qty 50

## 2022-08-23 MED ORDER — MAGNESIUM SULFATE 2 GM/50ML IV SOLN
2.0000 g | Freq: Once | INTRAVENOUS | Status: AC
  Administered 2022-08-23: 2 g via INTRAVENOUS
  Filled 2022-08-23: qty 50

## 2022-08-23 MED ORDER — KETOROLAC TROMETHAMINE 15 MG/ML IJ SOLN
15.0000 mg | Freq: Four times a day (QID) | INTRAMUSCULAR | Status: DC | PRN
  Administered 2022-08-23 – 2022-08-25 (×4): 15 mg via INTRAVENOUS
  Filled 2022-08-23 (×4): qty 1

## 2022-08-23 NOTE — Progress Notes (Signed)
eLink Physician-Brief Progress Note Patient Name: [name] DOB: December 28, 1990 MRN: [id number]   Date of Service  08/23/2022  HPI/Events of Note  BMP and Mg++ results noted.  eICU Interventions          Migdalia Dk 08/23/2022, 3:47 AM

## 2022-08-23 NOTE — Progress Notes (Signed)
NAME:  [name], MRN:  [id number], DOB:  Aug 17, 1990, LOS: 1 ADMISSION DATE:  08/22/2022, CONSULTATION DATE:  08/23/22 REFERRING MD:  Tegeler, CHIEF COMPLAINT:  overdose   History of Present Illness:  [name] is a 32 y.o. F with PMH significant for adjustment disorder with mixed anxiety and depressed mood, autism, bipolar 1 disorder, fragile x syndrome, seizures, schizoaffective disorder and multiple suicide attempts in the last year who presented to the ED after ingesting numerous medications in a suicide attempt.  She reported taking #30 metoprolol 25mg  tablets, #60 Hydroxyzine 25mg , #7 Lexapro 20mg , #21 Gabapentin 600mg , #7 Flexeril 10mg , #7 protonix 40mg  and #7 Prazosin 1mg .   She was slightly bradycardic and hypotensive on EMS arrival which improved with fluids.  Poison control was contacted and as she was within 2hrs of ingestion was given activated charcoal.  Poison control also recommended ICU admission for close Qtc and electrolyte monitoring and possible pressors.  At the time of admission, pt was somnolent but arousable to voice and following commands.  Pertinent  Medical History   has a past medical history of Acid reflux, Adjustment disorder with mixed anxiety and depressed mood (01/31/2022), Adjustment disorder with mixed disturbance of emotions and conduct (08/03/2019), Anxiety, Asthma, Autism, Bipolar 1 disorder, depressed, severe (HCC) (07/25/2021), Carrier of fragile X syndrome, Chronic constipation, Depression, Drug-seeking behavior, Essential tremor, Headache, Ineffective individual coping (05/16/2022), Insomnia (01/12/2022), Intentional drug overdose (HCC) (06/05/2022), Neuromuscular disorder (HCC), Normocytic anemia (06/05/2022), Overdose (07/22/2017), Overdose of acetaminophen (07/2017), Overdose, intentional self-harm, initial encounter (HCC) (07/20/2021), Paranoia (HCC) (04/22/2021), Personality disorder (HCC), Purposeful non-suicidal drug ingestion (HCC)  (06/27/2021), Schizo-affective psychosis (HCC), Schizoaffective disorder (HCC) (07/29/2022), Schizoaffective disorder, bipolar type (HCC), Seizures (HCC), Skin erythema (04/27/2022), Sleep apnea, Suicidal behavior (07/25/2021), Suicidal ideation, Suicide (HCC) (07/01/2021), and Suicide attempt (HCC) (07/04/2021).   Significant Hospital Events: Including procedures, antibiotic start and stop dates in addition to other pertinent events   7/17 admit for intentional overdose; eventually required intubation and pressors  Interim History / Subjective:   Pressor needs improving Mentation seems to be improving  Objective   Blood pressure 97/65, pulse 67, temperature 99.3 F (37.4 C), temperature source Bladder, resp. rate 20, height 5\' 5"  (1.651 m), weight 102.8 kg, last menstrual period 08/08/2022, SpO2 100%.    Vent Mode: CPAP;PSV FiO2 (%):  [40 %-100 %] 40 % Set Rate:  [10 bmp-20 bmp] 10 bmp Vt Set:  [450 mL-500 mL] 450 mL PEEP:  [5 cmH20] 5 cmH20 Pressure Support:  [10 cmH20] 10 cmH20 Plateau Pressure:  [18 cmH20-20 cmH20] 20 cmH20   Intake/Output Summary (Last 24 hours) at 08/23/2022 4098 Last data filed at 08/23/2022 0800 Gross per 24 hour  Intake 1708.99 ml  Output 1775 ml  Net -66.01 ml   Filed Weights   08/22/22 1352 08/23/22 0440  Weight: 100.2 kg 102.8 kg   No distress RASS -1 Moves to command Triggers vent Scattered rhonci Mild secretions Slightly pale skin Abd soft  WBC up Cr okay  Resolved Hospital Problem list     Assessment & Plan:  Polysubstance overdose intentional- BB, flexeril, prazosin, gabapentin, hyroxyzine Acute metabolic encephalopathy secondary to overdose culminating in inability to protect airway and acute respiratory failure; suspicion she aspirated during this decline based on CXR, WBC bump Hypokalemia- repleting Myriad of psych disorders- see previous psych notes  - Wean to extubate - Levophed for MAP 65 - Will see how she does after  extubation and consider psych consult if she is able to participate -  Appreciate poison control input Wellsite geologist, suicide precautions  Best Practice (right click and "Reselect all SmartList Selections" daily)   Diet/type: NPO DVT prophylaxis: LMWH GI prophylaxis: N/A Lines: N/A Foley:  N/A Code Status:  full code Last date of multidisciplinary goals of care discussion [pending]  31 min cc time Myrla Halsted MD PCCM

## 2022-08-23 NOTE — Procedures (Signed)
Extubation Procedure Note  Patient Details:   Name: [name] DOB: [birth year] MRN: [id number]   Airway Documentation:  Airway 7.5 mm (Active)  Secured at (cm) 21 cm 08/23/22 0854  Measured From Lips 08/23/22 0854  Secured Location Right 08/23/22 0854  Secured By [employer] 08/23/22 0854  Tube Holder Repositioned Yes 08/23/22 0736  Prone position No 08/23/22 0736  Cuff Pressure (cm H2O) Green OR 18-26 [facility] 08/23/22 0736  Site Condition Dry 08/23/22 0854   Vent end date: (not recorded) Vent end time: (not recorded)   Evaluation  O2 sats: stable throughout Complications: No apparent complications Patient did tolerate procedure well. Bilateral Breath Sounds: Clear, Diminished   Yes  Pt extubated to [name] 4L Positive cuff leak prior to extubation.Pt is able to state name and DOB. No distress or stridor noted.  [name] [name] 08/23/2022, 9:30 AM
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
## 2022-08-23 NOTE — Congregational Nurse Program (Signed)
[facility] ADULT ICU REPLACEMENT PROTOCOL   The patient does apply for the [facility] Adult ICU Electrolyte Replacment Protocol based on the criteria listed below:   1.Exclusion criteria: TCTS, ECMO, Dialysis, and Myasthenia Gravis patients 2. Is GFR >/= 30 ml/min? Yes.    Patient's GFR today is >60 3. Is SCr </= 2? Yes.   Patient's SCr is 0.61 mg/dL 4. Did SCr increase >/= 0.5 in 24 hours? No. 5.Pt's weight >40kg  Yes.   6. Abnormal electrolyte(s): magnesium 1.9  7. Electrolytes replaced per protocol 8.  Call MD STAT for K+ </= 2.5, Phos </= 1, or Mag </= 1 Physician:  Dr. Namon Cirri A Asa Fath 08/23/2022 3:51 AM

## 2022-08-24 DIAGNOSIS — T50902A Poisoning by unspecified drugs, medicaments and biological substances, intentional self-harm, initial encounter: Secondary | ICD-10-CM | POA: Diagnosis not present

## 2022-08-24 DIAGNOSIS — T1491XA Suicide attempt, initial encounter: Secondary | ICD-10-CM | POA: Diagnosis not present

## 2022-08-24 LAB — GLUCOSE, CAPILLARY
Glucose-Capillary: 102 mg/dL — ABNORMAL HIGH (ref 70–99)
Glucose-Capillary: 107 mg/dL — ABNORMAL HIGH (ref 70–99)
Glucose-Capillary: 119 mg/dL — ABNORMAL HIGH (ref 70–99)
Glucose-Capillary: 196 mg/dL — ABNORMAL HIGH (ref 70–99)
Glucose-Capillary: 252 mg/dL — ABNORMAL HIGH (ref 70–99)
Glucose-Capillary: 76 mg/dL (ref 70–99)

## 2022-08-24 LAB — CULTURE, BLOOD (ROUTINE X 2): Culture: NO GROWTH

## 2022-08-24 MED ORDER — OLANZAPINE 10 MG IM SOLR
2.5000 mg | Freq: Once | INTRAMUSCULAR | Status: DC | PRN
  Filled 2022-08-24 (×3): qty 10

## 2022-08-24 MED ORDER — OLANZAPINE 5 MG PO TABS
5.0000 mg | ORAL_TABLET | Freq: Every day | ORAL | Status: AC
  Administered 2022-08-24: 5 mg via ORAL
  Filled 2022-08-24: qty 1

## 2022-08-24 MED ORDER — ALBUTEROL SULFATE (2.5 MG/3ML) 0.083% IN NEBU: 3.0000 mL | INHALATION_SOLUTION | Freq: Four times a day (QID) | RESPIRATORY_TRACT | Status: DC | PRN

## 2022-08-24 MED ORDER — MOMETASONE FURO-FORMOTEROL FUM 200-5 MCG/ACT IN AERO
2.0000 | INHALATION_SPRAY | Freq: Two times a day (BID) | RESPIRATORY_TRACT | Status: DC
  Administered 2022-08-24 – 2022-10-19 (×83): 2 via RESPIRATORY_TRACT
  Filled 2022-08-24 (×6): qty 8.8
  Filled 2022-08-24: qty 17.6
  Filled 2022-08-24 (×12): qty 8.8

## 2022-08-24 NOTE — Progress Notes (Signed)
NAME:  [name], MRN:  [id number], DOB:  January 01, 1991, LOS: 2 ADMISSION DATE:  08/22/2022, CONSULTATION DATE:  08/24/22 REFERRING MD:  Tegeler, CHIEF COMPLAINT:  overdose   History of Present Illness:  [name] is a 32 y.o. F with PMH significant for adjustment disorder with mixed anxiety and depressed mood, autism, bipolar 1 disorder, fragile x syndrome, seizures, schizoaffective disorder and multiple suicide attempts in the last year who presented to the ED after ingesting numerous medications in a suicide attempt.  She reported taking #30 metoprolol 25mg  tablets, #60 Hydroxyzine 25mg , #7 Lexapro 20mg , #21 Gabapentin 600mg , #7 Flexeril 10mg , #7 protonix 40mg  and #7 Prazosin 1mg .   She was slightly bradycardic and hypotensive on EMS arrival which improved with fluids.  Poison control was contacted and as she was within 2hrs of ingestion was given activated charcoal.  Poison control also recommended ICU admission for close Qtc and electrolyte monitoring and possible pressors.  At the time of admission, pt was somnolent but arousable to voice and following commands.  Pertinent  Medical History   has a past medical history of Acid reflux, Adjustment disorder with mixed anxiety and depressed mood (01/31/2022), Adjustment disorder with mixed disturbance of emotions and conduct (08/03/2019), Anxiety, Asthma, Autism, Bipolar 1 disorder, depressed, severe (HCC) (07/25/2021), Carrier of fragile X syndrome, Chronic constipation, Depression, Drug-seeking behavior, Essential tremor, Headache, Ineffective individual coping (05/16/2022), Insomnia (01/12/2022), Intentional drug overdose (HCC) (06/05/2022), Neuromuscular disorder (HCC), Normocytic anemia (06/05/2022), Overdose (07/22/2017), Overdose of acetaminophen (07/2017), Overdose, intentional self-harm, initial encounter (HCC) (07/20/2021), Paranoia (HCC) (04/22/2021), Personality disorder (HCC), Purposeful non-suicidal drug ingestion (HCC)  (06/27/2021), Schizo-affective psychosis (HCC), Schizoaffective disorder (HCC) (07/29/2022), Schizoaffective disorder, bipolar type (HCC), Seizures (HCC), Skin erythema (04/27/2022), Sleep apnea, Suicidal behavior (07/25/2021), Suicidal ideation, Suicide (HCC) (07/01/2021), and Suicide attempt (HCC) (07/04/2021).   Significant Hospital Events: Including procedures, antibiotic start and stop dates in addition to other pertinent events   7/17 admit for intentional overdose  7/18 extubated, still on low dose levophed 7/19 awake, alert off pressors, stable for transfer out of ICU      Objective   Blood pressure 114/63, pulse 77, temperature 98.8 F (37.1 C), resp. rate 16, height 5\' 5"  (1.651 m), weight 102.6 kg, last menstrual period 08/08/2022, SpO2 95%.        Intake/Output Summary (Last 24 hours) at 08/24/2022 0907 Last data filed at 08/24/2022 0755 Gross per 24 hour  Intake 1646.59 ml  Output 4325 ml  Net -2678.41 ml   Filed Weights   08/22/22 1352 08/23/22 0440 08/24/22 0328  Weight: 100.2 kg 102.8 kg 102.6 kg   General:  obese F, resting in bed in no distress HEENT: MM pink/moist, pupils equal Neuro: alert, flat affect, conversational and following commands CV: s1s2 rrr, no m/r/g PULM:  clear bilaterally on room air without distress GI: soft, non-tender  Extremities: warm/dry, no edema  Skin: no rashes or lesions  Resolved Hospital Problem list     Assessment & Plan:   Intentional overdose  Suicidal Ideation Anxiety and Depression, Schizoaffective Disorder, Adjustment Disorder Multiple medications including #30 metoprolol 25mg  tablets, #60 Hydroxyzine 25mg , #7 Lexapro 20mg , #21 Gabapentin 600mg , #7 Flexeril 10mg , #7 protonix 40mg  and #7 Prazosin 1mg  Received charcoal Denies ETOH or illicit drug use -intubated and extubated 7/18, now on RA -stop glucagon today -placed under IVC in the ED -inpatient psychiatry consult for help with resuming psych medications and  further psychiatric care.  She has attempted suicide multiple times over the last year  -  safety sitter  -stable for transfer out of ICU  HTN -blood pressure still borderline low, continue holding lopressor   Best Practice (right click and "Reselect all SmartList Selections" daily)   Diet/type: Regular consistency (see orders) DVT prophylaxis: LMWH GI prophylaxis: N/A Lines: Central line and No longer needed.  Order written to d/c  Foley:  Yes, and it is no longer needed Code Status:  full code Last date of multidisciplinary goals of care discussion [improving]  Labs   CBC: Recent Labs  Lab 08/20/22 2040 08/22/22 1420 08/22/22 1536 08/22/22 1934 08/23/22 0422  WBC 8.2 8.8 9.0  --  16.1*  NEUTROABS 5.2 5.4  --   --   --   HGB 11.9* 10.5* 9.4* 10.5* 9.6*  HCT 38.5 34.1* 31.8* 31.0* 31.2*  MCV 80.2 80.6 81.1  --  78.4*  PLT 372 321 270  --  305    Basic Metabolic Panel: Recent Labs  Lab 08/20/22 2040 08/22/22 1420 08/22/22 1536 08/22/22 1934 08/22/22 2337 08/23/22 0422  NA 138 133*  --  137 136 132*  K 3.6 4.0  --  4.3 4.1 3.6  CL 107 105  --   --  107 105  CO2 22 21*  --   --  21* 20*  GLUCOSE 90 109*  --   --  188* 130*  BUN 10 11  --   --  8 7  CREATININE 0.70 0.74 0.66  --  0.61 0.54  CALCIUM 9.3 8.4*  --   --  8.2* 7.9*  MG  --  2.0  --   --  1.9 1.9  PHOS  --   --   --   --   --  2.7   GFR: Estimated Creatinine Clearance: 118.7 mL/min (by C-G formula based on SCr of 0.54 mg/dL). Recent Labs  Lab 08/20/22 2040 08/22/22 1420 08/22/22 1536 08/23/22 0422  WBC 8.2 8.8 9.0 16.1*    Liver Function Tests: Recent Labs  Lab 08/20/22 2040 08/22/22 1420 08/23/22 0422  AST 20 16 17   ALT 19 14 14   ALKPHOS 84 73 67  BILITOT 0.1* 0.3 0.5  PROT 7.7 6.7 5.9*  ALBUMIN 3.9 3.5 3.0*   No results for input(s): "LIPASE", "AMYLASE" in the last 168 hours. No results for input(s): "AMMONIA" in the last 168 hours.  ABG    Component Value Date/Time   PHART  7.275 (L) 08/22/2022 1934   PCO2ART 43.1 08/22/2022 1934   PO2ART 499 (H) 08/22/2022 1934   HCO3 20.1 08/22/2022 1934   TCO2 21 (L) 08/22/2022 1934   ACIDBASEDEF 7.0 (H) 08/22/2022 1934   O2SAT 100 08/22/2022 1934     Coagulation Profile: No results for input(s): "INR", "PROTIME" in the last 168 hours.  Cardiac Enzymes: No results for input(s): "CKTOTAL", "CKMB", "CKMBINDEX", "TROPONINI" in the last 168 hours.  HbA1C: Hgb A1c MFr Bld  Date/Time Value Ref Range Status  04/20/2022 06:21 AM 5.2 4.8 - 5.6 % Final    Comment:    (NOTE)         Prediabetes: 5.7 - 6.4         Diabetes: >6.4         Glycemic control for adults with diabetes: <7.0   10/07/2021 06:33 AM 5.0 4.8 - 5.6 % Final    Comment:    (NOTE) Pre diabetes:          5.7%-6.4%  Diabetes:              >  6.4%  Glycemic control for   <7.0% adults with diabetes     CBG: Recent Labs  Lab 08/23/22 1635 08/23/22 2024 08/23/22 2308 08/24/22 0409 08/24/22 0804  GLUCAP 197* 177* 187* 196* 252*    Review of Systems:   Unable to obtain  Past Medical History:  She,  has a past medical history of Acid reflux, Adjustment disorder with mixed anxiety and depressed mood (01/31/2022), Adjustment disorder with mixed disturbance of emotions and conduct (08/03/2019), Anxiety, Asthma, Autism, Bipolar 1 disorder, depressed, severe (HCC) (07/25/2021), Carrier of fragile X syndrome, Chronic constipation, Depression, Drug-seeking behavior, Essential tremor, Headache, Ineffective individual coping (05/16/2022), Insomnia (01/12/2022), Intentional drug overdose (HCC) (06/05/2022), Neuromuscular disorder (HCC), Normocytic anemia (06/05/2022), Overdose (07/22/2017), Overdose of acetaminophen (07/2017), Overdose, intentional self-harm, initial encounter (HCC) (07/20/2021), Paranoia (HCC) (04/22/2021), Personality disorder (HCC), Purposeful non-suicidal drug ingestion (HCC) (06/27/2021), Schizo-affective psychosis (HCC), Schizoaffective  disorder (HCC) (07/29/2022), Schizoaffective disorder, bipolar type (HCC), Seizures (HCC), Skin erythema (04/27/2022), Sleep apnea, Suicidal behavior (07/25/2021), Suicidal ideation, Suicide (HCC) (07/01/2021), and Suicide attempt (HCC) (07/04/2021).   Surgical History:   Past Surgical History:  Procedure Laterality Date   MOUTH SURGERY  2009 or 2010     Social History:   reports that she has quit smoking. Her smoking use included cigarettes. She has never used smokeless tobacco. She reports that she does not drink alcohol and does not use drugs.   Family History:  Her family history includes Asthma in her father; Mental illness in her father; PDD in her brother; Seizures in her brother.   Allergies Allergies  Allergen Reactions   Bee Venom Anaphylaxis   Coconut Flavor Anaphylaxis and Rash   Fish Allergy Anaphylaxis   Geodon [Ziprasidone Hcl] Other (See Comments)    Pt states that this medication causes paralysis of the mouth.     Haloperidol And Related Other (See Comments)    Pt states that this medication causes paralysis of the mouth, jaw locks up   Lithobid [Lithium] Other (See Comments)    Seizure-like activity    Roxicodone [Oxycodone] Other (See Comments)    Hallucinations    Seroquel [Quetiapine] Other (See Comments)    Severe drowsiness - Pt currently taking 12.5 mg BID, she is ok with this dose   Shellfish Allergy Anaphylaxis   Phenergan [Promethazine Hcl] Other (See Comments)    Chest pain     Prilosec [Omeprazole] Nausea And Vomiting and Other (See Comments)    Pt can take protonix with no problems    Sulfa Antibiotics Other (See Comments)    Chest pain    Tegretol [Carbamazepine] Nausea And Vomiting   Prozac [Fluoxetine] Other (See Comments)    Increased Depression and Suicidal thoughts   Tape Other (See Comments)    Skin tears, can only tolerate paper tape.   Tylenol [Acetaminophen] Rash and Other (See Comments)    Rash on face      Home Medications   Prior to Admission medications   Medication Sig Start Date End Date Taking? Authorizing Provider  benztropine (COGENTIN) 1 MG tablet Take 1 mg by mouth at bedtime. 08/21/22  Yes [provider]  cyclobenzaprine (FLEXERIL) 10 MG tablet Take 10 mg by mouth at bedtime. 05/10/22  Yes [provider]  escitalopram (LEXAPRO) 20 MG tablet Take 1 tablet (20 mg total) by mouth daily. 06/24/22  Yes White, Patrice L, NP  gabapentin (NEURONTIN) 600 MG tablet Take 600 mg by mouth 3 (three) times daily. 06/29/22  Yes [provider]  hydrOXYzine (ATARAX) 25 MG tablet Take 1 tablet (25 mg total) by mouth 3 (three) times daily as needed for anxiety. 06/11/22  Yes Starleen Blue, NP  metoprolol tartrate (LOPRESSOR) 25 MG tablet Take 0.5 tablets (12.5 mg total) by mouth 2 (two) times daily for 20 days. 08/05/22 08/25/22 Yes Cardama, Amadeo Garnet, MD  OLANZapine (ZYPREXA) 10 MG tablet Take 10 mg by mouth at bedtime.   Yes [provider]  pantoprazole (PROTONIX) 40 MG tablet Take 40 mg by mouth daily before breakfast.   Yes [provider]  prazosin (MINIPRESS) 1 MG capsule Take 1 mg by mouth at bedtime.   Yes [provider]  albuterol (VENTOLIN HFA) 108 (90 Base) MCG/ACT inhaler Inhale 2 puffs into the lungs every 6 (six) hours as needed for wheezing or shortness of breath.    [provider]  diclofenac Sodium (VOLTAREN) 1 % GEL Apply 2 g topically 2 (two) times daily. 06/11/22   Starleen Blue, NP  fluticasone-salmeterol (ADVAIR) 250-50 MCG/ACT AEPB Inhale 1 puff into the lungs 2 (two) times daily.    [provider]     Critical care time:       Darcella Gasman Jeany Seville, PA-C [facility] See Amion for pager If no response to pager , please call [phone] until 7pm After 7:00 pm call Elink  [phone]

## 2022-08-24 NOTE — TOC Progression Note (Addendum)
Transition of Care [facility]) - Progression Note    Patient Details  Name: [name] MRN: [id number] Date of Birth: 1990-05-25  [facility] CM/SW Contact  Delilah Shan, LCSWA Phone Number: 08/24/2022, 3:16 PM  Clinical Narrative:      TOC following to help assist with patients dc planning needs when medically cleared. Psychiatry currently recommending inpatient psych. when patient is medically ready. CSW will continue to follow.      Expected Discharge Plan and Services                                               Social Determinants of Health (SDOH) Interventions SDOH Screenings   Food Insecurity: No Food Insecurity (07/29/2022)  Housing: Low Risk  (07/29/2022)  Transportation Needs: No Transportation Needs (07/29/2022)  Recent Concern: Transportation Needs - Unmet Transportation Needs (05/06/2022)  Utilities: Not At Risk (07/29/2022)  Alcohol Screen: Low Risk  (07/29/2022)  Depression (PHQ2-9): Medium Risk (03/10/2022)  Tobacco Use: Medium Risk (08/22/2022)    Readmission Risk Interventions    06/06/2022    3:38 PM  Readmission Risk Prevention Plan  Transportation Screening Complete  Medication Review (RN Care Manager) Complete  PCP or Specialist appointment within 3-5 days of discharge Complete  HRI or Home Care Consult Complete  SW Recovery Care/Counseling Consult Complete  Palliative Care Screening Not Applicable  Skilled Nursing Facility Not Applicable

## 2022-08-24 NOTE — Progress Notes (Addendum)
eLink Physician-Brief Progress Note Patient Name: [name] DOB: [birth year] MRN: [id number]   Date of Service  08/24/2022  HPI/Events of Note  Patient with agitated delirium / psychosis, she pulled off all her monitors and ran into the hallway.  eICU Interventions  PRN IM Zypexa ordered.        Thomasene Lot Glynnis Gavel 08/24/2022, 8:21 PM

## 2022-08-25 DIAGNOSIS — F315 Bipolar disorder, current episode depressed, severe, with psychotic features: Secondary | ICD-10-CM | POA: Diagnosis not present

## 2022-08-25 DIAGNOSIS — F411 Generalized anxiety disorder: Secondary | ICD-10-CM

## 2022-08-25 DIAGNOSIS — J4520 Mild intermittent asthma, uncomplicated: Secondary | ICD-10-CM

## 2022-08-25 DIAGNOSIS — T50902A Poisoning by unspecified drugs, medicaments and biological substances, intentional self-harm, initial encounter: Secondary | ICD-10-CM | POA: Diagnosis not present

## 2022-08-25 DIAGNOSIS — T1491XA Suicide attempt, initial encounter: Secondary | ICD-10-CM | POA: Diagnosis not present

## 2022-08-25 LAB — BASIC METABOLIC PANEL
Anion gap: 12 (ref 5–15)
BUN: 19 mg/dL (ref 6–20)
CO2: 17 mmol/L — ABNORMAL LOW (ref 22–32)
Calcium: 8.7 mg/dL — ABNORMAL LOW (ref 8.9–10.3)
Chloride: 108 mmol/L (ref 98–111)
Creatinine, Ser: 0.91 mg/dL (ref 0.44–1.00)
GFR, Estimated: >60 mL/min (ref >60)
Glucose, Bld: 84 mg/dL (ref 70–99)
Potassium: 3.9 mmol/L (ref 3.5–5.1)
Sodium: 137 mmol/L (ref 135–145)

## 2022-08-25 LAB — RETICULOCYTES
Immature Retic Fract: 13.3 % (ref 2.3–15.9)
RBC.: 3.99 MIL/uL (ref 3.87–5.11)
Retic Count, Absolute: 45.5 10*3/uL (ref 19.0–186.0)
Retic Ct Pct: 1.1 % (ref 0.4–3.1)

## 2022-08-25 LAB — IRON AND TIBC
Iron: 58 ug/dL (ref 28–170)
Saturation Ratios: 14 % (ref 10.4–31.8)
TIBC: 420 ug/dL (ref 250–450)
UIBC: 362 ug/dL

## 2022-08-25 LAB — CBC
HCT: 31.9 % — ABNORMAL LOW (ref 36.0–46.0)
Hemoglobin: 9.9 g/dL — ABNORMAL LOW (ref 12.0–15.0)
MCH: 24.6 pg — ABNORMAL LOW (ref 26.0–34.0)
MCHC: 31 g/dL (ref 30.0–36.0)
MCV: 79.4 fL — ABNORMAL LOW (ref 80.0–100.0)
Platelets: 272 10*3/uL (ref 150–400)
RBC: 4.02 MIL/uL (ref 3.87–5.11)
RDW: 15.7 % — ABNORMAL HIGH (ref 11.5–15.5)
WBC: 6.4 10*3/uL (ref 4.0–10.5)
nRBC: 0 % (ref 0.0–0.2)

## 2022-08-25 LAB — FOLATE: Folate: 11.6 ng/mL (ref >5.9)

## 2022-08-25 LAB — GLUCOSE, CAPILLARY: Glucose-Capillary: 93 mg/dL (ref 70–99)

## 2022-08-25 LAB — VITAMIN B12: Vitamin B-12: 1205 pg/mL — ABNORMAL HIGH (ref 180–914)

## 2022-08-25 LAB — CULTURE, BLOOD (ROUTINE X 2): Culture: NO GROWTH

## 2022-08-25 LAB — MAGNESIUM: Magnesium: 1.5 mg/dL — ABNORMAL LOW (ref 1.7–2.4)

## 2022-08-25 LAB — PROCALCITONIN: Procalcitonin: 14.43 ng/mL

## 2022-08-25 LAB — FERRITIN: Ferritin: 18 ng/mL (ref 11–307)

## 2022-08-25 MED ORDER — DULOXETINE HCL 30 MG PO CPEP
30.0000 mg | ORAL_CAPSULE | Freq: Every day | ORAL | Status: DC
  Administered 2022-08-26 – 2022-09-01 (×7): 30 mg via ORAL
  Filled 2022-08-25 (×8): qty 1

## 2022-08-25 MED ORDER — LORAZEPAM 0.5 MG PO TABS
0.5000 mg | ORAL_TABLET | Freq: Two times a day (BID) | ORAL | Status: DC | PRN
  Administered 2022-08-25 – 2022-10-16 (×66): 0.5 mg via ORAL
  Filled 2022-08-25 (×69): qty 1

## 2022-08-25 MED ORDER — PANTOPRAZOLE SODIUM 40 MG PO TBEC
40.0000 mg | DELAYED_RELEASE_TABLET | Freq: Every day | ORAL | Status: DC
  Administered 2022-08-26 – 2022-10-19 (×55): 40 mg via ORAL
  Filled 2022-08-25 (×55): qty 1

## 2022-08-25 MED ORDER — STERILE WATER FOR INJECTION IJ SOLN
INTRAMUSCULAR | Status: AC
  Filled 2022-08-25: qty 10

## 2022-08-25 MED ORDER — GABAPENTIN 300 MG PO CAPS
300.0000 mg | ORAL_CAPSULE | Freq: Three times a day (TID) | ORAL | Status: DC
  Administered 2022-08-25 – 2022-08-26 (×3): 300 mg via ORAL
  Filled 2022-08-25 (×3): qty 1

## 2022-08-25 MED ORDER — TRAZODONE HCL 50 MG PO TABS
50.0000 mg | ORAL_TABLET | Freq: Every evening | ORAL | Status: DC | PRN
  Administered 2022-08-26 – 2022-09-06 (×8): 50 mg via ORAL
  Filled 2022-08-25 (×10): qty 1

## 2022-08-25 MED ORDER — MAGNESIUM SULFATE 2 GM/50ML IV SOLN
2.0000 g | Freq: Once | INTRAVENOUS | Status: AC
  Administered 2022-08-25: 2 g via INTRAVENOUS
  Filled 2022-08-25: qty 50

## 2022-08-25 MED ORDER — METOPROLOL TARTRATE 12.5 MG HALF TABLET
12.5000 mg | ORAL_TABLET | Freq: Two times a day (BID) | ORAL | Status: DC
  Administered 2022-08-25 – 2022-09-05 (×23): 12.5 mg via ORAL
  Filled 2022-08-25 (×23): qty 1

## 2022-08-25 MED ORDER — IBUPROFEN 400 MG PO TABS
400.0000 mg | ORAL_TABLET | Freq: Once | ORAL | Status: AC
  Administered 2022-08-25: 400 mg via ORAL
  Filled 2022-08-25: qty 1

## 2022-08-25 MED ORDER — PRAZOSIN HCL 1 MG PO CAPS
1.0000 mg | ORAL_CAPSULE | Freq: Every day | ORAL | Status: DC
  Administered 2022-08-25 – 2022-10-18 (×54): 1 mg via ORAL
  Filled 2022-08-25 (×60): qty 1

## 2022-08-25 MED ORDER — DICLOFENAC SODIUM 1 % EX GEL
2.0000 g | Freq: Two times a day (BID) | CUTANEOUS | Status: DC
  Administered 2022-08-25 – 2022-08-27 (×4): 2 g via TOPICAL
  Filled 2022-08-25 (×2): qty 100

## 2022-08-25 MED ORDER — CYCLOBENZAPRINE HCL 10 MG PO TABS
10.0000 mg | ORAL_TABLET | Freq: Every day | ORAL | Status: DC
  Administered 2022-08-25 – 2022-10-18 (×55): 10 mg via ORAL
  Filled 2022-08-25 (×26): qty 1
  Filled 2022-08-25: qty 2
  Filled 2022-08-25 (×16): qty 1
  Filled 2022-08-25: qty 2
  Filled 2022-08-25 (×4): qty 1
  Filled 2022-08-25: qty 2
  Filled 2022-08-25 (×6): qty 1

## 2022-08-25 NOTE — Consult Note (Signed)
[facility] Face-to-Face Psychiatry Consult   Reason for Consult:  Status post suicide attempt by overdose Referring Physician:  [name]  Patient Identification: [name] MRN:  [id number] Principal Diagnosis: <principal problem not specified> Diagnosis:  Active Problems:   Asthma   GAD (generalized anxiety disorder)   Suicide attempt (HCC)   Bipolar I disorder, current or most recent episode depressed, with psychotic features (HCC)   Overdose   Drug overdose   Subjective: ''32 y.o. female patient admitted with suicide attempt by overdose.  Objective:  Patient seen and evaluated face to face in her hospital room. She is a  32 y.o. female  with PMH significant for bipolar 1 disorder-depressed, fragile x syndrome, seizures, schizoaffective disorder and multiple suicide attempts. She was admitted to the hospital after she attempted suicide by overdosing on multiple medications. Today, she is alert, awake, oriented and reported taking #30 metoprolol 25mg  tablets, #60 Hydroxyzine 25mg , #7 Lexapro 20mg , #21 Gabapentin 600mg , #7 Flexeril 10mg , #7 protonix 40mg  and #7 Prazosin 1mg .   She reports ongoing depression characterized by anhedonia, poor sleep, hopelessness and recurrent suicidal thoughts. She is unable to contract for safety and still meet criteria for inpatient psychiatric admission after she is medically cleared.  Past Psychiatric History: Previous Psych Diagnoses: Schizoaffective disorder bipolar type, Anxiety, Autism spectrum disorder, PTSD, Borderline personality disorder Prior inpatient treatment: multiple, Admitted to [facility] on 07/20/22, has been to [facility], and was discharged from inpatient facility past week. Current/prior outpatient treatment: [facility] Prior rehab hx: denies multiple Psychotherapy hx: [facility] History of suicide: 20-30 times by overdosing on pills, cutting or drinking 409 disinfectant History of homicide or aggression:  denies Psychiatric medication history:  Previously trialed Tegretol, Haldol, Clozaril, Depakote, Invega, Lamictal, Seroquel, asenapine, abilify, vraylar, Lexapro, Zoloft  Neuromodulation history: denies  Risk to Self:  Yes Risk to Others:  No Prior Inpatient Therapy:  Yes Prior Outpatient Therapy:  Yes  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    Chronic constipation    Depression    Drug-seeking behavior    Essential tremor    Headache    Ineffective individual coping 05/16/2022   Insomnia 01/12/2022   Intentional drug overdose (HCC) 06/05/2022   Neuromuscular disorder (HCC)    Normocytic anemia 06/05/2022   Overdose 07/22/2017   Overdose of acetaminophen 07/2017   and other meds   Overdose, intentional self-harm, initial encounter (HCC) 07/20/2021   Paranoia (HCC) 04/22/2021   Personality disorder (HCC)    Purposeful non-suicidal drug ingestion (HCC) 06/27/2021   Schizo-affective psychosis (HCC)    Schizoaffective disorder (HCC) 07/29/2022   Schizoaffective disorder, bipolar type (HCC)    Seizures (HCC)    Last seizure December 2017   Skin erythema 04/27/2022   Sleep apnea    Suicidal behavior 07/25/2021   Suicidal ideation    Suicide (HCC) 07/01/2021   Suicide attempt (HCC) 07/04/2021    Past Surgical History:  Procedure Laterality Date   MOUTH SURGERY  2009 or 2010   Family History:  Family History  Problem Relation Age of Onset   Mental illness Father  Asthma Father    PDD Brother    Seizures Brother    Social History:  Social History   Substance and Sexual Activity  Alcohol Use No   Alcohol/week: 1.0 standard  drink of alcohol   Types: 1 Standard drinks or equivalent per week   Comment: denies at this time     Social History   Substance and Sexual Activity  Drug Use No   Comment: History of cocaine use at age 57 for 4 months    Social History   Socioeconomic History   Marital status: Widowed    Spouse name: Not on file   Number of children: 0   Years of education: Not on file   Highest education level: Not on file  Occupational History   Occupation: disability  Tobacco Use   Smoking status: Former    Types: Cigarettes   Smokeless tobacco: Never   Tobacco comments:    Smoked for 2  years age [age]  Vaping Use   Vaping status: Never Used  Substance and Sexual Activity   Alcohol use: No    Alcohol/week: 1.0 standard drink of alcohol    Types: 1 Standard drinks or equivalent per week    Comment: denies at this time   Drug use: No    Comment: History of cocaine use at age 32 for 4 months   Sexual activity: Not Currently    Birth control/protection: None  Other Topics Concern   Not on file  Social History Narrative   Marital status: Widowed      Children: daughter      Lives: with boyfriend, in two story home      Employment:  Disability      Tobacco: quit smoking; smoked for two years.      Alcohol ;none      Drugs: none   Has not traveled outside of the country.   Right handed         Patient with hx of Fibromyalgia,Orthostatic Tachycardia,Asthma,Arthritis,Gerd, ASD   Social Determinants of Health   Financial Resource Strain: Not on file  Food Insecurity: No Food Insecurity (07/29/2022)   Hunger Vital Sign    Worried About Running Out of Food in the Last Year: Never true    Ran Out of Food in the Last Year: Never true  Transportation Needs: No Transportation Needs (07/29/2022)   PRAPARE - Administrator, Civil Service (Medical): No    Lack of Transportation (Non-Medical): No  Recent Concern: Transportation Needs - Unmet Transportation Needs (05/06/2022)    PRAPARE - Administrator, Civil Service (Medical): Yes    Lack of Transportation (Non-Medical): Yes  Physical Activity: Not on file  Stress: Not on file  Social Connections: Not on file   Additional Social History:    Allergies:   Allergies  Allergen Reactions   Bee Venom Anaphylaxis   Coconut Flavor Anaphylaxis and Rash   Fish Allergy Anaphylaxis   Geodon [Ziprasidone Hcl] Other (See Comments)    Pt states that this medication causes paralysis of the mouth.     Haloperidol And Related Other (See Comments)    Pt states that this medication causes paralysis of the mouth, jaw locks up   Lithobid [Lithium] Other (See Comments)    Seizure-like activity    Roxicodone [Oxycodone] Other (See Comments)    Hallucinations    Seroquel [Quetiapine] Other (See Comments)    Severe drowsiness - Pt currently taking 12.5 mg BID, she  is ok with this dose   Shellfish Allergy Anaphylaxis   Phenergan [Promethazine Hcl] Other (See Comments)    Chest pain     Prilosec [Omeprazole] Nausea And Vomiting and Other (See Comments)    Pt can take protonix with no problems    Sulfa Antibiotics Other (See Comments)    Chest pain    Tegretol [Carbamazepine] Nausea And Vomiting   Prozac [Fluoxetine] Other (See Comments)    Increased Depression and Suicidal thoughts   Tape Other (See Comments)    Skin tears, can only tolerate paper tape.   Tylenol [Acetaminophen] Rash and Other (See Comments)    Rash on face     Labs:  Results for orders placed or performed during the hospital encounter of 08/22/22 (from the past 48 hour(s))  Glucose, capillary     Status: Abnormal   Collection Time: 08/23/22  4:35 PM  Result Value Ref Range   Glucose-Capillary 197 (H) 70 - 99 mg/dL    Comment: Glucose reference range applies only to samples taken after fasting for at least 8 hours.   Comment 1 Notify RN   Glucose, capillary     Status: Abnormal   Collection Time: 08/23/22  8:24 PM  Result Value Ref  Range   Glucose-Capillary 177 (H) 70 - 99 mg/dL    Comment: Glucose reference range applies only to samples taken after fasting for at least 8 hours.   Comment 1 Notify RN   Glucose, capillary     Status: Abnormal   Collection Time: 08/23/22 11:08 PM  Result Value Ref Range   Glucose-Capillary 187 (H) 70 - 99 mg/dL    Comment: Glucose reference range applies only to samples taken after fasting for at least 8 hours.  Glucose, capillary     Status: Abnormal   Collection Time: 08/24/22  4:09 AM  Result Value Ref Range   Glucose-Capillary 196 (H) 70 - 99 mg/dL    Comment: Glucose reference range applies only to samples taken after fasting for at least 8 hours.  Glucose, capillary     Status: Abnormal   Collection Time: 08/24/22  8:04 AM  Result Value Ref Range   Glucose-Capillary 252 (H) 70 - 99 mg/dL    Comment: Glucose reference range applies only to samples taken after fasting for at least 8 hours.  Glucose, capillary     Status: Abnormal   Collection Time: 08/24/22 11:35 AM  Result Value Ref Range   Glucose-Capillary 107 (H) 70 - 99 mg/dL    Comment: Glucose reference range applies only to samples taken after fasting for at least 8 hours.  Glucose, capillary     Status: Abnormal   Collection Time: 08/24/22  4:10 PM  Result Value Ref Range   Glucose-Capillary 102 (H) 70 - 99 mg/dL    Comment: Glucose reference range applies only to samples taken after fasting for at least 8 hours.   Comment 1 Notify RN   Glucose, capillary     Status: None   Collection Time: 08/24/22  8:41 PM  Result Value Ref Range   Glucose-Capillary 76 70 - 99 mg/dL    Comment: Glucose reference range applies only to samples taken after fasting for at least 8 hours.  Glucose, capillary     Status: Abnormal   Collection Time: 08/24/22 11:35 PM  Result Value Ref Range   Glucose-Capillary 119 (H) 70 - 99 mg/dL    Comment: Glucose reference range applies only to samples taken after fasting for  at least 8 hours.   CBC     Status: Abnormal   Collection Time: 08/25/22  7:26 AM  Result Value Ref Range   WBC 6.4 4.0 - 10.5 K/uL   RBC 4.02 3.87 - 5.11 MIL/uL   Hemoglobin 9.9 (L) 12.0 - 15.0 g/dL   HCT 95.6 (L) 21.3 - 08.6 %   MCV 79.4 (L) 80.0 - 100.0 fL   MCH 24.6 (L) 26.0 - 34.0 pg   MCHC 31.0 30.0 - 36.0 g/dL   RDW 57.8 (H) 46.9 - 62.9 %   Platelets 272 150 - 400 K/uL   nRBC 0.0 0.0 - 0.2 %    Comment: Performed at [facility], [address], [place], [place] [address]  Basic metabolic panel     Status: Abnormal   Collection Time: 08/25/22  7:26 AM  Result Value Ref Range   Sodium 137 135 - 145 mmol/L   Potassium 3.9 3.5 - 5.1 mmol/L   Chloride 108 98 - 111 mmol/L   CO2 17 (L) 22 - 32 mmol/L   Glucose, Bld 84 70 - 99 mg/dL    Comment: Glucose reference range applies only to samples taken after fasting for at least 8 hours.   BUN 19 6 - 20 mg/dL   Creatinine, Ser 3.24 0.44 - 1.00 mg/dL   Calcium 8.7 (L) 8.9 - 10.3 mg/dL   GFR, Estimated >40 >10 mL/min    Comment: (NOTE) Calculated using the CKD-EPI Creatinine Equation (2021)    Anion gap 12 5 - 15    Comment: Performed at [facility], [address] [place], [place] [address]  Magnesium     Status: Abnormal   Collection Time: 08/25/22  7:26 AM  Result Value Ref Range   Magnesium 1.5 (L) 1.7 - 2.4 mg/dL    Comment: Performed at [facility], [address] [place], [place] [address]  Vitamin B12     Status: Abnormal   Collection Time: 08/25/22  7:26 AM  Result Value Ref Range   Vitamin B-12 1,205 (H) 180 - 914 pg/mL    Comment: (NOTE) This assay is not validated for testing neonatal or myeloproliferative syndrome specimens for Vitamin B12 levels. Performed at [facility], [address] [place], [place] [address]   Folate     Status: None   Collection Time: 08/25/22  7:26 AM  Result Value Ref Range   Folate 11.6 >5.9 ng/mL    Comment: Performed at [facility], [address]  [place], [place] [address]  Reticulocytes     Status: None   Collection Time: 08/25/22  7:26 AM  Result Value Ref Range   Retic Ct Pct 1.1 0.4 - 3.1 %   RBC. 3.99 3.87 - 5.11 MIL/uL   Retic Count, Absolute 45.5 19.0 - 186.0 K/uL   Immature Retic Fract 13.3 2.3 - 15.9 %    Comment: Performed at [facility], [address], [place], [place] [address]  Glucose, capillary     Status: None   Collection Time: 08/25/22  7:30 AM  Result Value Ref Range   Glucose-Capillary 93 70 - 99 mg/dL    Comment: Glucose reference range applies only to samples taken after fasting for at least 8 hours.    Current Facility-Administered Medications  Medication Dose Route Frequency Provider Last Rate Last Admin   0.9 %  sodium chloride infusion  250 mL Intravenous Continuous Gleason, Darcella Gasman, PA-C  Stopped at 08/24/22 1347   albuterol (PROVENTIL) (2.5 MG/3ML) 0.083% nebulizer solution 3 mL  3 mL Inhalation Q6H PRN Gleason, Darcella Gasman, PA-C       cefTRIAXone (ROCEPHIN) 2 g in sodium chloride 0.9 % 100 mL IVPB  2 g Intravenous Q24H Lorin Glass, MD 200 mL/hr at 08/25/22 1300 Infusion Verify at 08/25/22 1300   Chlorhexidine Gluconate Cloth 2 % PADS 6 each  6 each Topical Q0600 Virl Diamond A, MD   6 each at 08/24/22 0535   docusate sodium (COLACE) capsule 100 mg  100 mg Oral BID PRN Olalere, Adewale A, MD       famotidine (PEPCID) tablet 20 mg  20 mg Oral BID Lorin Glass, MD   20 mg at 08/25/22 1114   insulin aspart (novoLOG) injection 0-15 Units  0-15 Units Subcutaneous Q4H Migdalia Dk, MD   8 Units at 08/24/22 0813   ketorolac (TORADOL) 15 MG/ML injection 15 mg  15 mg Intravenous Q6H PRN Gleason, Darcella Gasman, PA-C   15 mg at 08/25/22 1207   metoprolol tartrate (LOPRESSOR) tablet 12.5 mg  12.5 mg Oral BID Candelaria Stagers T, MD   12.5 mg at 08/25/22 1114   mometasone-formoterol (DULERA) 200-5 MCG/ACT inhaler 2 puff  2 puff Inhalation BID Gleason, Darcella Gasman, PA-C   2 puff at 08/25/22 1117   mupirocin ointment  (BACTROBAN) 2 % 1 Application  1 Application Nasal BID Olalere, Adewale A, MD   1 Application at 08/25/22 1119   OLANZapine (ZYPREXA) injection 2.5 mg  2.5 mg Intramuscular Once PRN Migdalia Dk, MD       Oral care mouth rinse  15 mL Mouth Rinse PRN Lorin Glass, MD       polyethylene glycol (MIRALAX / GLYCOLAX) packet 17 g  17 g Oral Daily PRN Tomma Lightning, MD        Musculoskeletal: Strength & Muscle Tone: within normal limits Gait & Station: normal Patient leans: N/A      Psychiatric Specialty Exam:  Presentation  General Appearance:  Appropriate for Environment  Eye Contact: Fair  Speech: Clear and Coherent  Speech Volume: Normal  Handedness: Right   Mood and Affect  Mood: Depressed; Anxious  Affect: Constricted; Congruent   Thought Process  Thought Processes: Goal Directed  Descriptions of Associations:Intact  Orientation:Full (Time, Place and Person)  Thought Content:Rumination  History of Schizophrenia/Schizoaffective disorder:Yes  Duration of Psychotic Symptoms:Less than six months  Hallucinations:No data recorded  Ideas of Reference:None  Suicidal Thoughts:yes  Homicidal Thoughts:denies   Sensorium  Memory: Immediate Fair; Recent Fair; Remote Fair  Judgment: Poor  Insight: Poor   Executive Functions  Concentration: Fair  Attention Span: Fair  Recall: [employer] of Knowledge: Fair  Language: Fair   Psychomotor Activity  Psychomotor Activity: retardation   Assets  Assets: Manufacturing systems engineer; Physical Health   Sleep  Sleep: fair   Physical Exam: Physical Exam Constitutional:      Appearance: Normal appearance.  HENT:     Head: Normocephalic and atraumatic.     Nose: Nose normal.  Eyes:     Pupils: Pupils are equal, round, and reactive to light.  Pulmonary:     Effort: Pulmonary effort is normal.  Skin:    General: Skin is warm and dry.  Neurological:     General: No focal  deficit present.     Mental Status: She is alert and oriented to person, place, and time.    Review of  Systems  Constitutional:  Positive for malaise/fatigue.  HENT: Negative.    Eyes: Negative.   Respiratory: Negative.    Cardiovascular: Negative.   Gastrointestinal: Negative.   Neurological: Negative.   Psychiatric/Behavioral:  Positive for depression and suicidal ideas. The patient is nervous/anxious and has insomnia.    Blood pressure (!) 107/90, pulse 96, temperature 98 F (36.7 C), temperature source Oral, resp. rate 17, height 5\' 5"  (1.651 m), weight 102.6 kg, last menstrual period 08/08/2022, SpO2 96%. Body mass index is 37.64 kg/m.  Treatment Plan Summary: Daily contact with patient to assess and evaluate symptoms and progress in treatment and Medication management Plan/Recommendations: -Continue 1:1 sitter for safety -Consider starting patient on Cymbalta 30 mg daily for depression and Trazodone 50 mg as needed at bedtime for sleep -Consider TOC/Social worker consult to facilitate psych inpatient admission after patient is medically stable  Disposition: Recommend psychiatric Inpatient admission when medically cleared. Supportive therapy provided about ongoing stressors.  Thedore Mins, MD

## 2022-08-25 NOTE — Plan of Care (Signed)
  Problem: Education: Goal: Knowledge of General Education information will improve Description: Including pain rating scale, medication(s)/side effects and non-pharmacologic comfort measures Outcome: Progressing   Problem: Health Behavior/Discharge Planning: Goal: Ability to manage health-related needs will improve Outcome: Progressing   Problem: Clinical Measurements: Goal: Ability to maintain clinical measurements within normal limits will improve Outcome: Progressing Goal: Will remain free from infection Outcome: Progressing Goal: Diagnostic test results will improve Outcome: Progressing Goal: Respiratory complications will improve Outcome: Progressing Goal: Cardiovascular complication will be avoided Outcome: Progressing   Problem: Activity: Goal: Risk for activity intolerance will decrease Outcome: Progressing   Problem: Nutrition: Goal: Adequate nutrition will be maintained Outcome: Progressing   Problem: Coping: Goal: Level of anxiety will decrease Outcome: Progressing   Problem: Elimination: Goal: Will not experience complications related to bowel motility Outcome: Progressing Goal: Will not experience complications related to urinary retention Outcome: Progressing   Problem: Pain Managment: Goal: General experience of comfort will improve Outcome: Progressing   Problem: Safety: Goal: Ability to remain free from injury will improve Outcome: Progressing   Problem: Skin Integrity: Goal: Risk for impaired skin integrity will decrease Outcome: Progressing   Problem: Safety: Goal: Non-violent Restraint(s) Outcome: Progressing   Problem: Activity: Goal: Ability to tolerate increased activity will improve Outcome: Progressing   Problem: Respiratory: Goal: Ability to maintain a clear airway and adequate ventilation will improve Outcome: Progressing   Problem: Role Relationship: Goal: Method of communication will improve Outcome: Progressing    Problem: Education: Goal: Ability to describe self-care measures that may prevent or decrease complications (Diabetes Survival Skills Education) will improve Outcome: Progressing Goal: Individualized Educational Video(s) Outcome: Progressing   Problem: Coping: Goal: Ability to adjust to condition or change in health will improve Outcome: Progressing   Problem: Fluid Volume: Goal: Ability to maintain a balanced intake and output will improve Outcome: Progressing   Problem: Health Behavior/Discharge Planning: Goal: Ability to identify and utilize available resources and services will improve Outcome: Progressing Goal: Ability to manage health-related needs will improve Outcome: Progressing   Problem: Metabolic: Goal: Ability to maintain appropriate glucose levels will improve Outcome: Progressing   Problem: Nutritional: Goal: Maintenance of adequate nutrition will improve Outcome: Progressing Goal: Progress toward achieving an optimal weight will improve Outcome: Progressing   Problem: Skin Integrity: Goal: Risk for impaired skin integrity will decrease Outcome: Progressing   Problem: Tissue Perfusion: Goal: Adequacy of tissue perfusion will improve Outcome: Progressing

## 2022-08-25 NOTE — Progress Notes (Signed)
PROGRESS NOTE  [name] ZOX:[id number] DOB: [birth year]   PCP: [name]  Patient is from: Home  DOA: 08/22/2022 LOS: 3  Chief complaints Chief Complaint  Patient presents with   Drug Overdose     Brief Narrative / Interim history: 32 y.o. F with PMH of adjustment disorder with mixed anxiety and depressed mood, autism, bipolar 1 disorder, fragile x syndrome, seizures not on meds, schizoaffective disorder and multiple suicide attempts in the last year who presented to the ED after ingesting numerous medications in a suicide attempt.  She reported taking #30 metoprolol 25mg  tablets, #60 Hydroxyzine 25mg , #7 Lexapro 20mg , #21 Gabapentin 600mg , #7 Flexeril 10mg , #7 protonix 40mg  and #7 Prazosin 1mg .   She was slightly bradycardic and hypotensive on EMS arrival which improved with fluids.  Poison control was contacted and as she was within 2hrs of ingestion was given activated charcoal.  Placed under IVC in ED.  Poison control also recommended ICU admission for close Qtc and electrolyte monitoring and possible pressors.  At the time of admission, pt was somnolent but arousable to voice and following commands. She was intubated and started on vasopressor and glucagon.  Also started on ceftriaxone for possible pneumonia.  Patient was extubated on 7/18 and transferred to the floor on 7/19.  Psychiatry recommended inpatient psychiatric hospitalization.    Subjective: Seen and examined earlier this morning.  No major events overnight of this morning.  She reports auditory hallucination.  Denies visual hallucination or suicidal ideation.  Denies chest pain, cough, shortness of breath, GI or UTI symptoms.  Objective: Vitals:   08/25/22 0724 08/25/22 0800 08/25/22 0900 08/25/22 1000  BP: 121/65     Pulse: (!) 104 (!) 106 (!) 104 (!) 105  Resp: 14 (!) 24 (!) 21 (!) 24  Temp: 97.9 F (36.6 C)     TempSrc: Oral     SpO2: 99% 99% 97% 98%  Weight:      Height:         Examination:  GENERAL: No apparent distress.  Nontoxic. HEENT: MMM.  Vision and hearing grossly intact.  NECK: Supple.  No apparent JVD.  RESP:  No IWOB.  Fair aeration bilaterally. CVS: Tachycardic to 105.  Heart sounds normal.  ABD/GI/GU: BS+. Abd soft, NTND.  MSK/EXT:  Moves extremities. No apparent deformity. No edema.  SKIN: no apparent skin lesion or wound NEURO: Sleepy but wakes to voice.  Oriented.  No apparent focal neuro deficit. PSYCH: Calm. Normal affect.  Reports auditory hallucination.  Procedures:  7/17-18: Intubation and mechanical ventilation  Microbiology summarized: MRSA PCR screen positive Blood cultures NGTD  Assessment and plan: Active Problems:   Asthma   GAD (generalized anxiety disorder)   Suicide attempt (HCC)   Bipolar I disorder, current or most recent episode depressed, with psychotic features (HCC)   Overdose   Drug overdose   Suicidal attempt with intentional overdose: Overdose with multiple medications including #30 metoprolol 25mg  tablets, #60 Hydroxyzine 25mg , #7 Lexapro 20mg , #21 Gabapentin 600mg , #7 Flexeril 10mg , #7 protonix 40mg  and #7 Prazosin 1mg .  Poison control consulted. Received charcoal. Denies ETOH or illicit drug use.  Extended UDS negative. -Required vasopressor, glucagon, intubation and mechanical ventilation 7/17-7/18.  Room air and hemodynamically stable. -Placed under IVC in the ED. -Psychiatry recommended inpatient psychiatric hospitalization -Continue one-to-one safety sitter -Patient is medically stable  Anxiety and Depression, Schizoaffective Disorder, Adjustment Disorder, bipolar disorder and auditory hallucination: Home psych medications on hold due to overdose. -Consulted psych again to  resume home meds as appropriate  Possible cardiac or pneumonia: No respiratory symptoms.  CXR showed atelectasis or infiltrate in left midlung.  -Continue ceftriaxone -Check procalcitonin  HTN: Normotensive. -Resume home  Lopressor  Sinus tachycardia: Likely from holding blood pressure. -Resume home Lopressor  Metabolic acidosis: Likely from IV fluid -Recheck in the morning  Hypomagnesemia -Monitor replenish as appropriate  Microcytic anemia: H&H relatively stable. Recent Labs    07/29/22 0140 07/29/22 0423 08/04/22 2122 08/09/22 0216 08/20/22 2040 08/22/22 1420 08/22/22 1536 08/22/22 1934 08/23/22 0422 08/25/22 0726  HGB 10.6* 11.0* 11.2* 10.1* 11.9* 10.5* 9.4* 10.5* 9.6* 9.9*  -Check anemia panel  History of seizure disorder: Stable.  Not on meds.  History of asthma: Stable. -Continue home meds  Class II obesity Body mass index is 37.64 kg/m. -Encourage lifestyle change to lose weight.          DVT prophylaxis:  SCDs Start: 08/22/22 1514  Code Status: Full code Family Communication: None at bedside Level of care: Telemetry Medical Status is: Inpatient Remains inpatient appropriate because: Due to suicidal attempt with overdose   Final disposition: Inpatient psychiatric hospitalization Consultants:  Pulmonology admitted patient Psychiatry  55 minutes with more than 50% spent in reviewing records, counseling patient/family and coordinating care.   Sch Meds:  Scheduled Meds:  Chlorhexidine Gluconate Cloth  6 each Topical Q0600   famotidine  20 mg Oral BID   insulin aspart  0-15 Units Subcutaneous Q4H   metoprolol tartrate  12.5 mg Oral BID   mometasone-formoterol  2 puff Inhalation BID   mupirocin ointment  1 Application Nasal BID   Continuous Infusions:  sodium chloride Stopped (08/24/22 1347)   cefTRIAXone (ROCEPHIN)  IV 200 mL/hr at 08/24/22 1800   magnesium sulfate bolus IVPB     PRN Meds:.albuterol, docusate sodium, ketorolac, OLANZapine, mouth rinse, polyethylene glycol  Antimicrobials: Anti-infectives (From admission, onward)    Start     Dose/Rate Route Frequency Ordered Stop   08/23/22 1200  cefTRIAXone (ROCEPHIN) 2 g in sodium chloride 0.9 % 100  mL IVPB        2 g 200 mL/hr over 30 Minutes Intravenous Every 24 hours 08/23/22 0931 08/28/22 1159        I have personally reviewed the following labs and images: CBC: Recent Labs  Lab 08/20/22 2040 08/22/22 1420 08/22/22 1536 08/22/22 1934 08/23/22 0422 08/25/22 0726  WBC 8.2 8.8 9.0  --  16.1* 6.4  NEUTROABS 5.2 5.4  --   --   --   --   HGB 11.9* 10.5* 9.4* 10.5* 9.6* 9.9*  HCT 38.5 34.1* 31.8* 31.0* 31.2* 31.9*  MCV 80.2 80.6 81.1  --  78.4* 79.4*  PLT 372 321 270  --  305 272   BMP &GFR Recent Labs  Lab 08/20/22 2040 08/22/22 1420 08/22/22 1536 08/22/22 1934 08/22/22 2337 08/23/22 0422 08/25/22 0726  NA 138 133*  --  137 136 132* 137  K 3.6 4.0  --  4.3 4.1 3.6 3.9  CL 107 105  --   --  107 105 108  CO2 22 21*  --   --  21* 20* 17*  GLUCOSE 90 109*  --   --  188* 130* 84  BUN 10 11  --   --  8 7 19   CREATININE 0.70 0.74 0.66  --  0.61 0.54 0.91  CALCIUM 9.3 8.4*  --   --  8.2* 7.9* 8.7*  MG  --  2.0  --   --  1.9 1.9 1.5*  PHOS  --   --   --   --   --  2.7  --    Estimated Creatinine Clearance: 104.4 mL/min (by C-G formula based on SCr of 0.91 mg/dL). Liver & Pancreas: Recent Labs  Lab 08/20/22 2040 08/22/22 1420 08/23/22 0422  AST 20 16 17   ALT 19 14 14   ALKPHOS 84 73 67  BILITOT 0.1* 0.3 0.5  PROT 7.7 6.7 5.9*  ALBUMIN 3.9 3.5 3.0*   No results for input(s): "LIPASE", "AMYLASE" in the last 168 hours. No results for input(s): "AMMONIA" in the last 168 hours. Diabetic: No results for input(s): "HGBA1C" in the last 72 hours. Recent Labs  Lab 08/24/22 1135 08/24/22 1610 08/24/22 2041 08/24/22 2335 08/25/22 0730  GLUCAP 107* 102* 76 119* 93   Cardiac Enzymes: No results for input(s): "CKTOTAL", "CKMB", "CKMBINDEX", "TROPONINI" in the last 168 hours. No results for input(s): "PROBNP" in the last 8760 hours. Coagulation Profile: No results for input(s): "INR", "PROTIME" in the last 168 hours. Thyroid Function Tests: No results for  input(s): "TSH", "T4TOTAL", "FREET4", "T3FREE", "THYROIDAB" in the last 72 hours. Lipid Profile: Recent Labs    08/23/22 0422  TRIG 112   Anemia Panel: No results for input(s): "VITAMINB12", "FOLATE", "FERRITIN", "TIBC", "IRON", "RETICCTPCT" in the last 72 hours. Urine analysis:    Component Value Date/Time   COLORURINE YELLOW 03/03/2022 1813   APPEARANCEUR HAZY (A) 03/03/2022 1813   LABSPEC 1.017 03/03/2022 1813   PHURINE 5.0 03/03/2022 1813   GLUCOSEU NEGATIVE 03/03/2022 1813   HGBUR NEGATIVE 03/03/2022 1813   BILIRUBINUR NEGATIVE 03/03/2022 1813   BILIRUBINUR negative 06/14/2015 0930   KETONESUR 5 (A) 03/03/2022 1813   PROTEINUR NEGATIVE 03/03/2022 1813   UROBILINOGEN 0.2 10/24/2015 1428   NITRITE NEGATIVE 03/03/2022 1813   LEUKOCYTESUR NEGATIVE 03/03/2022 1813   Sepsis Labs: Invalid input(s): "PROCALCITONIN", "LACTICIDVEN"  Microbiology: Recent Results (from the past 240 hour(s))  MRSA Next Gen by PCR, Nasal     Status: Abnormal   Collection Time: 08/22/22  4:24 PM   Specimen: Nasal Mucosa; Nasal Swab  Result Value Ref Range Status   MRSA by PCR Next Gen DETECTED (A) NOT DETECTED Final    Comment: RESULT CALLED TO, READ BACK BY AND VERIFIED WITH: rn s. ZOXWR [id number] @ 1933 fh (NOTE) The GeneXpert MRSA Assay (FDA approved for NASAL specimens only), is one component of a comprehensive MRSA colonization surveillance program. It is not intended to diagnose MRSA infection nor to guide or monitor treatment for MRSA infections. Test performance is not FDA approved in patients less than 54 years old. Performed at [facility], [address] [place], [place] [address]   Culture, blood (Routine X 2) w Reflex to ID Panel     Status: None (Preliminary result)   Collection Time: 08/23/22  9:28 AM   Specimen: BLOOD RIGHT HAND  Result Value Ref Range Status   Specimen Description BLOOD RIGHT HAND  Final   Special Requests   Final    BOTTLES DRAWN AEROBIC AND ANAEROBIC  Blood Culture adequate volume   Culture   Final    NO GROWTH 2 DAYS Performed at [facility], [address] [place], [place] [address]    Report Status PENDING  Incomplete  Culture, blood (Routine X 2) w Reflex to ID Panel     Status: None (Preliminary result)   Collection Time: 08/23/22  9:28 AM   Specimen: BLOOD LEFT HAND  Result  Value Ref Range Status   Specimen Description BLOOD LEFT HAND  Final   Special Requests   Final    BOTTLES DRAWN AEROBIC ONLY Blood Culture adequate volume   Culture   Final    NO GROWTH 2 DAYS Performed at [facility], [address], [place], [place] [address]    Report Status PENDING  Incomplete    Radiology Studies: No results found.    Lyana Asbill T. Yeriel Mineo [facility]  If 7PM-7AM, please contact night-coverage [email or web address] 08/25/2022, 11:04 AM

## 2022-08-25 NOTE — Plan of Care (Signed)
  Problem: Education: Goal: Knowledge of General Education information will improve Description: Including pain rating scale, medication(s)/side effects and non-pharmacologic comfort measures Outcome: Progressing   Problem: Health Behavior/Discharge Planning: Goal: Ability to manage health-related needs will improve Outcome: Progressing   Problem: Clinical Measurements: Goal: Ability to maintain clinical measurements within normal limits will improve Outcome: Progressing Goal: Will remain free from infection Outcome: Progressing Goal: Diagnostic test results will improve Outcome: Progressing Goal: Cardiovascular complication will be avoided Outcome: Progressing   Problem: Activity: Goal: Risk for activity intolerance will decrease Outcome: Progressing   Problem: Coping: Goal: Level of anxiety will decrease Outcome: Progressing   Problem: Elimination: Goal: Will not experience complications related to bowel motility Outcome: Progressing   Problem: Pain Managment: Goal: General experience of comfort will improve Outcome: Progressing   Problem: Safety: Goal: Ability to remain free from injury will improve Outcome: Progressing   Problem: Skin Integrity: Goal: Risk for impaired skin integrity will decrease Outcome: Progressing   Problem: Role Relationship: Goal: Method of communication will improve Outcome: Progressing   Problem: Coping: Goal: Ability to adjust to condition or change in health will improve Outcome: Progressing   Problem: Fluid Volume: Goal: Ability to maintain a balanced intake and output will improve Outcome: Progressing   Problem: Health Behavior/Discharge Planning: Goal: Ability to identify and utilize available resources and services will improve Outcome: Progressing Goal: Ability to manage health-related needs will improve Outcome: Progressing   Problem: Metabolic: Goal: Ability to maintain appropriate glucose levels will  improve Outcome: Progressing   Problem: Nutritional: Goal: Maintenance of adequate nutrition will improve Outcome: Progressing Goal: Progress toward achieving an optimal weight will improve Outcome: Progressing   Problem: Skin Integrity: Goal: Risk for impaired skin integrity will decrease Outcome: Progressing   Problem: Tissue Perfusion: Goal: Adequacy of tissue perfusion will improve Outcome: Progressing

## 2022-08-26 DIAGNOSIS — J4520 Mild intermittent asthma, uncomplicated: Secondary | ICD-10-CM | POA: Diagnosis not present

## 2022-08-26 DIAGNOSIS — T50902A Poisoning by unspecified drugs, medicaments and biological substances, intentional self-harm, initial encounter: Secondary | ICD-10-CM | POA: Diagnosis not present

## 2022-08-26 DIAGNOSIS — T1491XA Suicide attempt, initial encounter: Secondary | ICD-10-CM | POA: Diagnosis not present

## 2022-08-26 DIAGNOSIS — F315 Bipolar disorder, current episode depressed, severe, with psychotic features: Secondary | ICD-10-CM | POA: Diagnosis not present

## 2022-08-26 LAB — RENAL FUNCTION PANEL
Albumin: 3.2 g/dL — ABNORMAL LOW (ref 3.5–5.0)
Anion gap: 13 (ref 5–15)
BUN: 15 mg/dL (ref 6–20)
CO2: 17 mmol/L — ABNORMAL LOW (ref 22–32)
Calcium: 8.8 mg/dL — ABNORMAL LOW (ref 8.9–10.3)
Chloride: 105 mmol/L (ref 98–111)
Creatinine, Ser: 0.84 mg/dL (ref 0.44–1.00)
GFR, Estimated: >60 mL/min (ref >60)
Glucose, Bld: 88 mg/dL (ref 70–99)
Phosphorus: 3 mg/dL (ref 2.5–4.6)
Potassium: 4.1 mmol/L (ref 3.5–5.1)
Sodium: 135 mmol/L (ref 135–145)

## 2022-08-26 LAB — CBC
HCT: 30.4 % — ABNORMAL LOW (ref 36.0–46.0)
Hemoglobin: 9.2 g/dL — ABNORMAL LOW (ref 12.0–15.0)
MCH: 23.5 pg — ABNORMAL LOW (ref 26.0–34.0)
MCHC: 30.3 g/dL (ref 30.0–36.0)
MCV: 77.6 fL — ABNORMAL LOW (ref 80.0–100.0)
Platelets: 285 10*3/uL (ref 150–400)
RBC: 3.92 MIL/uL (ref 3.87–5.11)
RDW: 15.7 % — ABNORMAL HIGH (ref 11.5–15.5)
WBC: 6.8 10*3/uL (ref 4.0–10.5)
nRBC: 0 % (ref 0.0–0.2)

## 2022-08-26 LAB — MAGNESIUM: Magnesium: 1.7 mg/dL (ref 1.7–2.4)

## 2022-08-26 MED ORDER — BENZTROPINE MESYLATE 1 MG PO TABS
1.0000 mg | ORAL_TABLET | Freq: Every day | ORAL | Status: DC
  Administered 2022-08-26 – 2022-09-06 (×12): 1 mg via ORAL
  Filled 2022-08-26 (×18): qty 1

## 2022-08-26 MED ORDER — SODIUM BICARBONATE 650 MG PO TABS
650.0000 mg | ORAL_TABLET | Freq: Three times a day (TID) | ORAL | Status: DC
  Administered 2022-08-27 – 2022-10-10 (×129): 650 mg via ORAL
  Filled 2022-08-26 (×136): qty 1

## 2022-08-26 MED ORDER — OLANZAPINE 10 MG PO TABS
10.0000 mg | ORAL_TABLET | Freq: Every day | ORAL | Status: DC
  Administered 2022-08-26 – 2022-09-06 (×12): 10 mg via ORAL
  Filled 2022-08-26 (×15): qty 1

## 2022-08-26 MED ORDER — POLYETHYLENE GLYCOL 3350 17 G PO PACK
17.0000 g | PACK | Freq: Two times a day (BID) | ORAL | Status: DC | PRN
  Administered 2022-08-26 – 2022-10-18 (×41): 17 g via ORAL
  Filled 2022-08-26 (×44): qty 1

## 2022-08-26 MED ORDER — MAGNESIUM SULFATE 2 GM/50ML IV SOLN: 2.0000 g | Freq: Once | INTRAVENOUS | Status: DC

## 2022-08-26 MED ORDER — AMOXICILLIN-POT CLAVULANATE 875-125 MG PO TABS
1.0000 | ORAL_TABLET | Freq: Two times a day (BID) | ORAL | Status: AC
  Administered 2022-08-26 – 2022-08-27 (×4): 1 via ORAL
  Filled 2022-08-26 (×5): qty 1

## 2022-08-26 MED ORDER — GABAPENTIN 300 MG PO CAPS
600.0000 mg | ORAL_CAPSULE | Freq: Three times a day (TID) | ORAL | Status: DC
  Administered 2022-08-26 – 2022-10-19 (×162): 600 mg via ORAL
  Filled 2022-08-26 (×53): qty 2
  Filled 2022-08-26: qty 6
  Filled 2022-08-26 (×109): qty 2

## 2022-08-26 MED ORDER — HYDROXYZINE HCL 25 MG PO TABS
25.0000 mg | ORAL_TABLET | Freq: Three times a day (TID) | ORAL | Status: DC | PRN
  Administered 2022-08-28 – 2022-10-19 (×51): 25 mg via ORAL
  Filled 2022-08-26 (×55): qty 1

## 2022-08-26 MED ORDER — SENNOSIDES-DOCUSATE SODIUM 8.6-50 MG PO TABS
1.0000 | ORAL_TABLET | Freq: Two times a day (BID) | ORAL | Status: DC | PRN
  Administered 2022-08-26 – 2022-10-18 (×40): 1 via ORAL
  Filled 2022-08-26 (×43): qty 1

## 2022-08-26 MED ORDER — FERROUS SULFATE 325 (65 FE) MG PO TABS
325.0000 mg | ORAL_TABLET | Freq: Two times a day (BID) | ORAL | Status: DC
  Administered 2022-08-26 – 2022-10-19 (×108): 325 mg via ORAL
  Filled 2022-08-26 (×109): qty 1

## 2022-08-26 NOTE — Plan of Care (Signed)
  Problem: Activity: Goal: Risk for activity intolerance will decrease Outcome: Progressing   Problem: Elimination: Goal: Will not experience complications related to bowel motility Outcome: Progressing   Problem: Education: Goal: Knowledge of General Education information will improve Description: Including pain rating scale, medication(s)/side effects and non-pharmacologic comfort measures Outcome: Not Progressing   Problem: Health Behavior/Discharge Planning: Goal: Ability to manage health-related needs will improve Outcome: Not Progressing   Problem: Coping: Goal: Level of anxiety will decrease Outcome: Not Progressing

## 2022-08-26 NOTE — Progress Notes (Signed)
Transferred to 6E accompanied by this Designer, television/film set.Alert and oriented.Handoff given to [employer].

## 2022-08-26 NOTE — Progress Notes (Signed)
Pt was found around 1640 to be hitting the bed side rails with her fist, stating "it's the only thing to make the voices stop. The voices are telling her to kill herself." Ms. Analycia got out of bed and began banging on the wall, tearful screaming for "the voices to shut up." She also stated some visual hallucinations. Security was called. Dr. Alanda Slim was notified; Zyprexa and soft restraints ordered. Pt got herself back in bed, crying under the blanket with some paranoia. She calmed down enough to willingly let staff put the soft-wrist restraints on. PO Zyprexa was given.  At 1810 pt got herself out of restraints, calmly sitting at end of bed with the sitter. Ms. Rozella stated the medicine was helping and was left calm and comfortable eating some lo mein and egg rolls.

## 2022-08-26 NOTE — Progress Notes (Signed)
PROGRESS NOTE  [name] ZOX:[id number] DOB: 21-Mar-1990   PCP: [name]  Patient is from: Home  DOA: 08/22/2022 LOS: 4  Chief complaints Chief Complaint  Patient presents with   Drug Overdose     Brief Narrative / Interim history: 32 y.o. F with PMH of adjustment disorder with mixed anxiety and depressed mood, autism, bipolar 1 disorder, fragile x syndrome, seizures not on meds, schizoaffective disorder and multiple suicide attempts in the last year who presented to the ED after ingesting numerous medications in a suicide attempt.  She reported taking #30 metoprolol 25mg  tablets, #60 Hydroxyzine 25mg , #7 Lexapro 20mg , #21 Gabapentin 600mg , #7 Flexeril 10mg , #7 protonix 40mg  and #7 Prazosin 1mg .   She was slightly bradycardic and hypotensive on EMS arrival which improved with fluids.  Poison control was contacted and as she was within 2hrs of ingestion was given activated charcoal.  Placed under IVC in ED.  Poison control also recommended ICU admission for close Qtc and electrolyte monitoring and possible pressors.  At the time of admission, pt was somnolent but arousable to voice and following commands. She was intubated and started on vasopressor and glucagon.  Also started on ceftriaxone for possible pneumonia.  Patient was extubated on 7/18 and transferred to the floor on 7/19.  Psychiatry recommended inpatient psychiatric hospitalization.    Subjective: Seen and examined earlier this morning.  No major events overnight of this morning.  Asking for her gabapentin to be increased to home dose for fibromyalgia.  She was also asking why she was started on Cymbalta instead of home Lexapro.  No complaints.  She denies auditory hallucination today.  Denies suicidal ideation.  Objective: Vitals:   08/25/22 2350 08/26/22 0320 08/26/22 0512 08/26/22 0843  BP:   (!) 94/56 (!) 109/44  Pulse:   92 86  Resp:      Temp:   98.4 F (36.9 C)   TempSrc: Oral  Oral    SpO2:   96%   Weight:  101.1 kg    Height:        Examination:  GENERAL: No apparent distress.  Nontoxic. HEENT: MMM.  Vision and hearing grossly intact.  NECK: Supple.  No apparent JVD.  RESP:  No IWOB.  Fair aeration bilaterally. CVS:  RRR. Heart sounds normal.  ABD/GI/GU: BS+. Abd soft, NTND.  MSK/EXT:   No apparent deformity. Moves extremities. No edema.  SKIN: no apparent skin lesion or wound NEURO: Awake and alert. Oriented appropriately.  No apparent focal neuro deficit.  PSYCH: Calm. Normal affect.  Denies hallucination.  Procedures:  7/17-18: Intubation and mechanical ventilation  Microbiology summarized: MRSA PCR screen positive Blood cultures NGTD  Assessment and plan: Active Problems:   Asthma   GAD (generalized anxiety disorder)   Suicide attempt (HCC)   Bipolar I disorder, current or most recent episode depressed, with psychotic features (HCC)   Overdose   Drug overdose   Suicidal attempt with intentional overdose: Overdose with multiple medications including #30 metoprolol 25mg  tablets, #60 Hydroxyzine 25mg , #7 Lexapro 20mg , #21 Gabapentin 600mg , #7 Flexeril 10mg , #7 protonix 40mg  and #7 Prazosin 1mg .  Poison control consulted. Received charcoal. Denies ETOH or illicit drug use.  Extended UDS negative. -Required vasopressor, glucagon, intubation and mechanical ventilation 7/17-7/18.  Room air and hemodynamically stable. -Placed under IVC in the ED.  -Continue one-to-one safety sitter -Patient is medically stable  Anxiety and Depression, Schizoaffective Disorder, Adjustment Disorder, bipolar disorder, fibromyalgia and auditory hallucination: Hallucinations seems to have resolved.  Patient is on Cogentin, Zyprexa, Atarax, prazosin and Lexapro at home -Psychiatry started Cymbalta and trazodone for sleep  -I have resumed Flexeril, gabapentin and prazosin. -Defer psych meds to psychiatry  Community-acquired pneumonia: No respiratory symptoms.  CXR showed  atelectasis or infiltrate in left midlung.  Procalcitonin elevated. -Continue ceftriaxone  HTN: Normotensive. -Resume home Lopressor  Sinus tachycardia: Resolved. -Continue home metoprolol.  Metabolic acidosis: Likely from IV fluid -Recheck in the morning  Hypomagnesemia -Monitor replenish as appropriate  Microcytic anemia: H&H relatively stable.  Anemia panel with some iron deficiency with elevated TIBC and low ferritin. Recent Labs    07/29/22 0423 08/04/22 2122 08/09/22 0216 08/20/22 2040 08/22/22 1420 08/22/22 1536 08/22/22 1934 08/23/22 0422 08/25/22 0726 08/26/22 0952  HGB 11.0* 11.2* 10.1* 11.9* 10.5* 9.4* 10.5* 9.6* 9.9* 9.2*  -P.o. ferrous sulfate with bowel regimen  History of seizure disorder: Stable.  Not on meds.  History of asthma: Stable. -Continue home meds  GERD -Continue PPI and Pepcid  Class II obesity Body mass index is 37.09 kg/m. -Encourage lifestyle change to lose weight.          DVT prophylaxis:  SCDs Start: 08/22/22 1514  Code Status: Full code Family Communication: None at bedside Level of care: Telemetry Medical Status is: Inpatient Remains inpatient appropriate because: Due to suicidal attempt with overdose   Final disposition: Inpatient psychiatric hospitalization Consultants:  Pulmonology admitted patient Psychiatry  55 minutes with more than 50% spent in reviewing records, counseling patient/family and coordinating care.   Sch Meds:  Scheduled Meds:  Chlorhexidine Gluconate Cloth  6 each Topical Q0600   cyclobenzaprine  10 mg Oral QHS   diclofenac Sodium  2 g Topical BID   DULoxetine  30 mg Oral Daily   famotidine  20 mg Oral BID   gabapentin  600 mg Oral TID   metoprolol tartrate  12.5 mg Oral BID   mometasone-formoterol  2 puff Inhalation BID   mupirocin ointment  1 Application Nasal BID   pantoprazole  40 mg Oral QAC breakfast   prazosin  1 mg Oral QHS   Continuous Infusions:  sodium chloride Stopped  (08/24/22 1347)   cefTRIAXone (ROCEPHIN)  IV Stopped (08/25/22 1327)   PRN Meds:.albuterol, docusate sodium, ketorolac, LORazepam, OLANZapine, mouth rinse, polyethylene glycol, traZODone  Antimicrobials: Anti-infectives (From admission, onward)    Start     Dose/Rate Route Frequency Ordered Stop   08/23/22 1200  cefTRIAXone (ROCEPHIN) 2 g in sodium chloride 0.9 % 100 mL IVPB        2 g 200 mL/hr over 30 Minutes Intravenous Every 24 hours 08/23/22 0931 08/28/22 1159        I have personally reviewed the following labs and images: CBC: Recent Labs  Lab 08/20/22 2040 08/22/22 1420 08/22/22 1536 08/22/22 1934 08/23/22 0422 08/25/22 0726 08/26/22 0952  WBC 8.2 8.8 9.0  --  16.1* 6.4 6.8  NEUTROABS 5.2 5.4  --   --   --   --   --   HGB 11.9* 10.5* 9.4* 10.5* 9.6* 9.9* 9.2*  HCT 38.5 34.1* 31.8* 31.0* 31.2* 31.9* 30.4*  MCV 80.2 80.6 81.1  --  78.4* 79.4* 77.6*  PLT 372 321 270  --  305 272 285   BMP &GFR Recent Labs  Lab 08/22/22 1420 08/22/22 1536 08/22/22 1934 08/22/22 2337 08/23/22 0422 08/25/22 0726 08/26/22 0952  NA 133*  --  137 136 132* 137 135  K 4.0  --  4.3 4.1 3.6 3.9 4.1  CL 105  --   --  107 105 108 105  CO2 21*  --   --  21* 20* 17* 17*  GLUCOSE 109*  --   --  188* 130* 84 88  BUN 11  --   --  8 7 19 15   CREATININE 0.74 0.66  --  0.61 0.54 0.91 0.84  CALCIUM 8.4*  --   --  8.2* 7.9* 8.7* 8.8*  MG 2.0  --   --  1.9 1.9 1.5* 1.7  PHOS  --   --   --   --  2.7  --  3.0   Estimated Creatinine Clearance: 112.2 mL/min (by C-G formula based on SCr of 0.84 mg/dL). Liver & Pancreas: Recent Labs  Lab 08/20/22 2040 08/22/22 1420 08/23/22 0422 08/26/22 0952  AST 20 16 17   --   ALT 19 14 14   --   ALKPHOS 84 73 67  --   BILITOT 0.1* 0.3 0.5  --   PROT 7.7 6.7 5.9*  --   ALBUMIN 3.9 3.5 3.0* 3.2*   No results for input(s): "LIPASE", "AMYLASE" in the last 168 hours. No results for input(s): "AMMONIA" in the last 168 hours. Diabetic: No results for  input(s): "HGBA1C" in the last 72 hours. Recent Labs  Lab 08/24/22 1135 08/24/22 1610 08/24/22 2041 08/24/22 2335 08/25/22 0730  GLUCAP 107* 102* 76 119* 93   Cardiac Enzymes: No results for input(s): "CKTOTAL", "CKMB", "CKMBINDEX", "TROPONINI" in the last 168 hours. No results for input(s): "PROBNP" in the last 8760 hours. Coagulation Profile: No results for input(s): "INR", "PROTIME" in the last 168 hours. Thyroid Function Tests: No results for input(s): "TSH", "T4TOTAL", "FREET4", "T3FREE", "THYROIDAB" in the last 72 hours. Lipid Profile: No results for input(s): "CHOL", "HDL", "LDLCALC", "TRIG", "CHOLHDL", "LDLDIRECT" in the last 72 hours.  Anemia Panel: Recent Labs    08/25/22 0726 08/25/22 1100  VITAMINB12 1,205*  --   FOLATE 11.6  --   FERRITIN  --  18  TIBC  --  420  IRON  --  58  RETICCTPCT 1.1  --    Urine analysis:    Component Value Date/Time   COLORURINE YELLOW 03/03/2022 1813   APPEARANCEUR HAZY (A) 03/03/2022 1813   LABSPEC 1.017 03/03/2022 1813   PHURINE 5.0 03/03/2022 1813   GLUCOSEU NEGATIVE 03/03/2022 1813   HGBUR NEGATIVE 03/03/2022 1813   BILIRUBINUR NEGATIVE 03/03/2022 1813   BILIRUBINUR negative 06/14/2015 0930   KETONESUR 5 (A) 03/03/2022 1813   PROTEINUR NEGATIVE 03/03/2022 1813   UROBILINOGEN 0.2 10/24/2015 1428   NITRITE NEGATIVE 03/03/2022 1813   LEUKOCYTESUR NEGATIVE 03/03/2022 1813   Sepsis Labs: Invalid input(s): "PROCALCITONIN", "LACTICIDVEN"  Microbiology: Recent Results (from the past 240 hour(s))  MRSA Next Gen by PCR, Nasal     Status: Abnormal   Collection Time: 08/22/22  4:24 PM   Specimen: Nasal Mucosa; Nasal Swab  Result Value Ref Range Status   MRSA by PCR Next Gen DETECTED (A) NOT DETECTED Final    Comment: RESULT CALLED TO, READ BACK BY AND VERIFIED WITH: rn s. GNFAO [id number] @ 1933 fh (NOTE) The GeneXpert MRSA Assay (FDA approved for NASAL specimens only), is one component of a comprehensive MRSA colonization  surveillance program. It is not intended to diagnose MRSA infection nor to guide or monitor treatment for MRSA infections. Test performance is not FDA approved in patients less than 26 years old. Performed at [facility], [address] [place], [place] [address]  Culture, blood (Routine X 2) w Reflex to ID Panel     Status: None (Preliminary result)   Collection Time: 08/23/22  9:28 AM   Specimen: BLOOD RIGHT HAND  Result Value Ref Range Status   Specimen Description BLOOD RIGHT HAND  Final   Special Requests   Final    BOTTLES DRAWN AEROBIC AND ANAEROBIC Blood Culture adequate volume   Culture   Final    NO GROWTH 3 DAYS Performed at [facility], [address] [place], [place] [address]    Report Status PENDING  Incomplete  Culture, blood (Routine X 2) w Reflex to ID Panel     Status: None (Preliminary result)   Collection Time: 08/23/22  9:28 AM   Specimen: BLOOD LEFT HAND  Result Value Ref Range Status   Specimen Description BLOOD LEFT HAND  Final   Special Requests   Final    BOTTLES DRAWN AEROBIC ONLY Blood Culture adequate volume   Culture   Final    NO GROWTH 3 DAYS Performed at [facility], [address] [place], [place] [address]    Report Status PENDING  Incomplete    Radiology Studies: No results found.    Erol Flanagin T. Denisse Whitenack [facility]  If 7PM-7AM, please contact night-coverage [email or web address] 08/26/2022, 12:24 PM

## 2022-08-27 DIAGNOSIS — F319 Bipolar disorder, unspecified: Secondary | ICD-10-CM

## 2022-08-27 DIAGNOSIS — T50912A Poisoning by multiple unspecified drugs, medicaments and biological substances, intentional self-harm, initial encounter: Secondary | ICD-10-CM

## 2022-08-27 DIAGNOSIS — F315 Bipolar disorder, current episode depressed, severe, with psychotic features: Secondary | ICD-10-CM | POA: Diagnosis not present

## 2022-08-27 DIAGNOSIS — T1491XA Suicide attempt, initial encounter: Secondary | ICD-10-CM | POA: Diagnosis not present

## 2022-08-27 DIAGNOSIS — J4520 Mild intermittent asthma, uncomplicated: Secondary | ICD-10-CM | POA: Diagnosis not present

## 2022-08-27 DIAGNOSIS — F411 Generalized anxiety disorder: Secondary | ICD-10-CM | POA: Diagnosis not present

## 2022-08-27 DIAGNOSIS — T50902A Poisoning by unspecified drugs, medicaments and biological substances, intentional self-harm, initial encounter: Secondary | ICD-10-CM | POA: Diagnosis not present

## 2022-08-27 LAB — CULTURE, BLOOD (ROUTINE X 2): Special Requests: ADEQUATE

## 2022-08-27 MED ORDER — DICLOFENAC SODIUM 1 % EX GEL
2.0000 g | Freq: Four times a day (QID) | CUTANEOUS | Status: DC
  Administered 2022-08-27 – 2022-10-19 (×196): 2 g via TOPICAL
  Filled 2022-08-27 (×16): qty 100

## 2022-08-27 MED ORDER — PHENAZOPYRIDINE HCL 200 MG PO TABS
200.0000 mg | ORAL_TABLET | Freq: Three times a day (TID) | ORAL | Status: AC
  Administered 2022-08-27 – 2022-08-29 (×6): 200 mg via ORAL
  Filled 2022-08-27 (×7): qty 1

## 2022-08-27 MED ORDER — IBUPROFEN 200 MG PO TABS
600.0000 mg | ORAL_TABLET | Freq: Three times a day (TID) | ORAL | Status: DC | PRN
  Administered 2022-08-27 – 2022-10-18 (×41): 600 mg via ORAL
  Filled 2022-08-27 (×12): qty 3
  Filled 2022-08-27: qty 1
  Filled 2022-08-27 (×35): qty 3
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
## 2022-08-27 NOTE — Progress Notes (Signed)
Pt transferred from 6E Bed 15 for downgrade in care.  She is now observations and awaiting a bed a [facility].  Pt was transported via wheelchair accompanied by a Tourist information centre manager. Assisted pt into her room and bed. Pt was observed to have a steady gait when ambulating. Reviewed POC with pt and introduced her to her surroundings. Obtained VS which were WNL except for her HR. Answered any pending questions. Pt had no further questions. Instructed pt to utilize RN call light for assistance. Hourly rounds performed. Bed in lowest position, locked with two upper side rails engaged. Belongings and call light within reach

## 2022-08-27 NOTE — Progress Notes (Signed)
Patient was observed to be scratching her left upper arm vigorously while sitter tries to stop her. When asked why she was doing that she responded " I want to kill myself by having an infection". The site was observed to be bruised and bleeding mildly. She refused pressure dressing. Sitter stayed at bedside with patient while RN informed provider. bilateral soft restrained was ordered.

## 2022-08-27 NOTE — Consult Note (Cosign Needed Addendum)
[facility] Face-to-Face Psychiatry Consult   Reason for Consult:  Status post suicide attempt by overdose Referring Physician:  [name]  Patient Identification: [name] MRN:  [id number] Principal Diagnosis: <principal problem not specified> Diagnosis:  Active Problems:   Asthma   GAD (generalized anxiety disorder)   Suicide attempt (HCC)   Bipolar I disorder, current or most recent episode depressed, with psychotic features (HCC)   Overdose   Drug overdose   TIme spent : 90 minutes  I personally spent 90 minutes on the unit in direct patient care. The direct patient care time included face-to-face time with the patient, reviewing the patient's chart, communicating with other professionals ([facility], [facility], and DSS), and coordinating care. Greater than 30% of this time was spent in counseling or coordinating care with the patient regarding goals of hospitalization, psycho-education, and discharge planning needs.   Subjective: ''32 y.o. female patient admitted with suicide attempt by overdose.  Objective:  Patient seen and evaluated face to face in her hospital room. She is a  32 y.o. female  with PMH significant for bipolar 1 disorder-depressed, fragile x syndrome, seizures, schizoaffective disorder and multiple suicide attempts. She was admitted to the hospital after she attempted suicide by overdosing on multiple medications. Today, she is alert, awake, oriented and reported taking #30 metoprolol 25mg  tablets, #60 Hydroxyzine 25mg , #7 Lexapro 20mg , #21 Gabapentin 600mg , #7 Flexeril 10mg , #7 protonix 40mg  and #7 Prazosin 1mg .     She identifies major stressors of her suicide attempt and worsening depression to finances and loss of job..  Patient reports over the past several months that she has been feeling increasingly depressed with depressed mood, anhedonia, hopelessness, helplessness, guilty, decreased appetite, decreased concentration, and sadness.  She reports recently  losing her job, due to physical inability to perform tasks " my heart and that is why I am in here."   She decided to overdose.  She has current services with [facility]. She reports history of 9 suicide attempts this year alone, majority in which she attempts and calls for help immediately after. Several of her admissions have resulted in medical admission, all of which can be verified.  Chart review confirms patient has numerous inpatient psychiatric hospitalizations this year alone. Today she minimizes her suicide attempt, and provides area of improvement that include reaching out to family and using previously identified coping skills to help with emotional dysregulation. She denies suicidality at this time, however she continues to minimize her recent attempt of high lethality, ICU admission and intubation.    Patient does not currently present with active symptoms of an acute manic or psychotic episode and she is denying SI/HI. Patient does meet involuntary commitment criteria at this time. Combination of personality structure and chronic psychiatric diagnoses that are leading to impulsivity and mood dysregulation increase overall risk of self-harm. She continues to meet criteria for inpatient psychiatry hospitalization.  Due to her level of lethality, intensity, and frequency of suicide attempts and complete disregard of self and at times others patient continues to need higher level of care.  Throughout her admission to the  ER and hospitalizations, the patient has displayed aggression, psychosis, paranoia, and danger to self while in close proximity of a Recruitment consultant and staff.  While she meets criteria for inpatient psychiatric hospitalization in this regard we are limited as she has had multiple hospitalizations with little to no benefit, and will need to work closely with LCSW for referral to [facility].  Past Psychiatric History: Previous Psych Diagnoses: Schizoaffective  disorder bipolar type, Anxiety, Autism spectrum disorder, PTSD, Borderline personality disorder Prior inpatient treatment: multiple, Admitted to [facility] on 07/[facility]/22, has been to [facility], and was discharged from inpatient facility past week. Current/prior outpatient treatment: [facility] Prior rehab hx: denies multiple Psychotherapy hx: [facility] History of suicide: 20-30 times by overdosing on pills, cutting or drinking 409 disinfectant History of homicide or aggression: denies Psychiatric medication history:  Previously trialed Tegretol, Haldol, Clozaril, Depakote, Invega, Lamictal, Seroquel, asenapine, abilify, vraylar, Lexapro, Zoloft  Neuromodulation history: denies  Risk to Self:  Yes Risk to Others:  No Prior Inpatient Therapy:  Yes Prior Outpatient Therapy:  Yes  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    Chronic constipation    Depression    Drug-seeking behavior    Essential tremor    Headache    Ineffective individual coping 05/16/2022   Insomnia 01/12/2022   Intentional drug overdose (HCC) 06/05/2022   Neuromuscular disorder (HCC)    Normocytic anemia 06/05/2022   Overdose 07/22/2017   Overdose of acetaminophen 07/2017   and other meds   Overdose, intentional self-harm, initial encounter (HCC) 07/20/2021   Paranoia (HCC) 04/22/2021   Personality disorder (HCC)    Purposeful non-suicidal drug ingestion (HCC) 06/27/2021   Schizo-affective psychosis (HCC)    Schizoaffective disorder (HCC) 07/29/2022   Schizoaffective disorder, bipolar type (HCC)    Seizures (HCC)    Last seizure December 2017   Skin erythema  04/27/2022   Sleep apnea    Suicidal behavior 07/25/2021   Suicidal ideation    Suicide (HCC) 07/01/2021   Suicide attempt (HCC) 07/04/2021    Past Surgical History:  Procedure Laterality Date   MOUTH SURGERY  2009 or 2010   Family History:  Family History  Problem Relation Age of Onset   Mental illness Father    Asthma Father    PDD Brother    Seizures Brother    Social History:  Social History   Substance and Sexual Activity  Alcohol Use No   Alcohol/week: 1.0 standard drink of alcohol   Types: 1 Standard drinks or equivalent per week   Comment: denies at this time     Social History   Substance and Sexual Activity  Drug Use No   Comment: History of cocaine use at age 32 for 4 months    Social History   Socioeconomic History   Marital status: Widowed    Spouse name: Not on file   Number of children: 0   Years of education: Not on file   Highest education level: Not on file  Occupational History   Occupation: disability  Tobacco Use   Smoking status: Former    Types: Cigarettes   Smokeless tobacco: Never   Tobacco comments:    Smoked for 2  years age [age]  Vaping Use   Vaping status: Never Used  Substance and Sexual Activity   Alcohol use: No    Alcohol/week: 1.0 standard drink of alcohol    Types: 1 Standard drinks or equivalent per week    Comment: denies  at this time   Drug use: No    Comment: History of cocaine use at age 86 for 4 months   Sexual activity: Not Currently    Birth control/protection: None  Other Topics Concern   Not on file  Social History Narrative   Marital status: Widowed      Children: daughter      Lives: with boyfriend, in two story home      Employment:  Disability      Tobacco: quit smoking; smoked for two years.      Alcohol ;none      Drugs: none   Has not traveled outside of the country.   Right handed         Patient with hx of Fibromyalgia,Orthostatic Tachycardia,Asthma,Arthritis,Gerd, ASD   Social  Determinants of Health   Financial Resource Strain: Not on file  Food Insecurity: No Food Insecurity (07/29/2022)   Hunger Vital Sign    Worried About Running Out of Food in the Last Year: Never true    Ran Out of Food in the Last Year: Never true  Transportation Needs: No Transportation Needs (07/29/2022)   PRAPARE - Administrator, Civil Service (Medical): No    Lack of Transportation (Non-Medical): No  Recent Concern: Transportation Needs - Unmet Transportation Needs (05/06/2022)   PRAPARE - Administrator, Civil Service (Medical): Yes    Lack of Transportation (Non-Medical): Yes  Physical Activity: Not on file  Stress: Not on file  Social Connections: Not on file   Additional Social History:    Allergies:   Allergies  Allergen Reactions   Bee Venom Anaphylaxis   Coconut Flavor Anaphylaxis and Rash   Fish Allergy Anaphylaxis   Geodon [Ziprasidone Hcl] Other (See Comments)    Pt states that this medication causes paralysis of the mouth.     Haloperidol And Related Other (See Comments)    Pt states that this medication causes paralysis of the mouth, jaw locks up   Lithobid [Lithium] Other (See Comments)    Seizure-like activity    Roxicodone [Oxycodone] Other (See Comments)    Hallucinations    Seroquel [Quetiapine] Other (See Comments)    Severe drowsiness - Pt currently taking 12.5 mg BID, she is ok with this dose   Shellfish Allergy Anaphylaxis   Phenergan [Promethazine Hcl] Other (See Comments)    Chest pain     Prilosec [Omeprazole] Nausea And Vomiting and Other (See Comments)    Pt can take protonix with no problems    Sulfa Antibiotics Other (See Comments)    Chest pain    Tegretol [Carbamazepine] Nausea And Vomiting   Prozac [Fluoxetine] Other (See Comments)    Increased Depression and Suicidal thoughts   Tape Other (See Comments)    Skin tears, can only tolerate paper tape.   Tylenol [Acetaminophen] Rash and Other (See Comments)     Rash on face     Labs:  Results for orders placed or performed during the hospital encounter of 08/22/22 (from the past 48 hour(s))  Renal function panel     Status: Abnormal   Collection Time: 08/26/22  9:52 AM  Result Value Ref Range   Sodium 135 135 - 145 mmol/L   Potassium 4.1 3.5 - 5.1 mmol/L   Chloride 105 98 - 111 mmol/L   CO2 17 (L) 22 - 32 mmol/L   Glucose, Bld 88 70 - 99 mg/dL    Comment: Glucose reference range applies only to  samples taken after fasting for at least 8 hours.   BUN 15 6 - 20 mg/dL   Creatinine, Ser 1.61 0.44 - 1.00 mg/dL   Calcium 8.8 (L) 8.9 - 10.3 mg/dL   Phosphorus 3.0 2.5 - 4.6 mg/dL   Albumin 3.2 (L) 3.5 - 5.0 g/dL   GFR, Estimated >09 >60 mL/min    Comment: (NOTE) Calculated using the CKD-EPI Creatinine Equation (2021)    Anion gap 13 5 - 15    Comment: Performed at [facility], [address] [place], [place] [address]  CBC     Status: Abnormal   Collection Time: 08/26/22  9:52 AM  Result Value Ref Range   WBC 6.8 4.0 - 10.5 K/uL   RBC 3.92 3.87 - 5.11 MIL/uL   Hemoglobin 9.2 (L) 12.0 - 15.0 g/dL   HCT 81.1 (L) 91.4 - 78.2 %   MCV 77.6 (L) 80.0 - 100.0 fL   MCH 23.5 (L) 26.0 - 34.0 pg   MCHC 30.3 30.0 - 36.0 g/dL   RDW 95.6 (H) 21.3 - 08.6 %   Platelets 285 150 - 400 K/uL   nRBC 0.0 0.0 - 0.2 %    Comment: Performed at [facility], [address] [place], [place] [address]  Magnesium     Status: None   Collection Time: 08/26/22  9:52 AM  Result Value Ref Range   Magnesium 1.7 1.7 - 2.4 mg/dL    Comment: Performed at [facility], [address], [place], [place] [address]    Current Facility-Administered Medications  Medication Dose Route Frequency Provider Last Rate Last Admin   0.9 %  sodium chloride infusion  250 mL Intravenous Continuous Gleason, Darcella Gasman, PA-C   Stopped at 08/24/22 1347   albuterol (PROVENTIL) (2.5 MG/3ML) 0.083% nebulizer solution 3 mL  3 mL Inhalation Q6H PRN Gleason, Darcella Gasman, PA-C        amoxicillin-clavulanate (AUGMENTIN) 875-125 MG per tablet 1 tablet  1 tablet Oral Q12H Almon Hercules, MD   1 tablet at 08/27/22 0855   benztropine (COGENTIN) tablet 1 mg  1 mg Oral QHS Candelaria Stagers T, MD   1 mg at 08/26/22 2105   Chlorhexidine Gluconate Cloth 2 % PADS 6 each  6 each Topical Q0600 Olalere, Adewale A, MD   6 each at 08/25/22 1630   cyclobenzaprine (FLEXERIL) tablet 10 mg  10 mg Oral QHS Candelaria Stagers T, MD   10 mg at 08/26/22 2105   diclofenac Sodium (VOLTAREN) 1 % topical gel 2 g  2 g Topical QID Candelaria Stagers T, MD   2 g at 08/27/22 1619   DULoxetine (CYMBALTA) DR capsule 30 mg  30 mg Oral Daily Akintayo, Mojeed, MD   30 mg at 08/27/22 0855   famotidine (PEPCID) tablet 20 mg  20 mg Oral BID Lorin Glass, MD   20 mg at 08/27/22 0856   ferrous sulfate tablet 325 mg  325 mg Oral BID WC Candelaria Stagers T, MD   325 mg at 08/27/22 1619   gabapentin (NEURONTIN) capsule 600 mg  600 mg Oral TID Candelaria Stagers T, MD   600 mg at 08/27/22 1619   hydrOXYzine (ATARAX) tablet 25 mg  25 mg Oral TID PRN Almon Hercules, MD       ibuprofen (ADVIL) tablet 600 mg  600 mg Oral TID PRN Candelaria Stagers T, MD   600 mg at 08/27/22 1622   LORazepam (ATIVAN) tablet 0.5  mg  0.5 mg Oral BID PRN Akintayo, Mojeed, MD   0.5 mg at 08/27/22 1426   metoprolol tartrate (LOPRESSOR) tablet 12.5 mg  12.5 mg Oral BID Candelaria Stagers T, MD   12.5 mg at 08/27/22 0856   mometasone-formoterol (DULERA) 200-5 MCG/ACT inhaler 2 puff  2 puff Inhalation BID Gleason, Darcella Gasman, PA-C   2 puff at 08/27/22 0840   mupirocin ointment (BACTROBAN) 2 % 1 Application  1 Application Nasal BID Olalere, Adewale A, MD   1 Application at 08/26/22 2103   OLANZapine (ZYPREXA) injection 2.5 mg  2.5 mg Intramuscular Once PRN Migdalia Dk, MD       OLANZapine (ZYPREXA) tablet 10 mg  10 mg Oral QHS Candelaria Stagers T, MD   10 mg at 08/26/22 1714   Oral care mouth rinse  15 mL Mouth Rinse PRN Lorin Glass, MD       pantoprazole (PROTONIX) EC tablet 40 mg  40 mg Oral  QAC breakfast Candelaria Stagers T, MD   40 mg at 08/27/22 4401   phenazopyridine (PYRIDIUM) tablet 200 mg  200 mg Oral TID WC Candelaria Stagers T, MD   200 mg at 08/27/22 1619   polyethylene glycol (MIRALAX / GLYCOLAX) packet 17 g  17 g Oral BID PRN Candelaria Stagers T, MD   17 g at 08/27/22 0854   prazosin (MINIPRESS) capsule 1 mg  1 mg Oral QHS Candelaria Stagers T, MD   1 mg at 08/26/22 2103   senna-docusate (Senokot-S) tablet 1 tablet  1 tablet Oral BID PRN Almon Hercules, MD   1 tablet at 08/27/22 0855   sodium bicarbonate tablet 650 mg  650 mg Oral TID Candelaria Stagers T, MD   650 mg at 08/27/22 1619   traZODone (DESYREL) tablet 50 mg  50 mg Oral QHS PRN Thedore Mins, MD   50 mg at 08/26/22 2104    Musculoskeletal: Strength & Muscle Tone: within normal limits Gait & Station: normal Patient leans: N/A      Psychiatric Specialty Exam:  Presentation  General Appearance:  Appropriate for Environment; Casual  Eye Contact: Fair  Speech: Clear and Coherent; Normal Rate  Speech Volume: Normal  Handedness: Right   Mood and Affect  Mood: Depressed; Anxious  Affect: Constricted; Congruent   Thought Process  Thought Processes: Linear; Coherent  Descriptions of Associations:Intact  Orientation:Full (Time, Place and Person)  Thought Content:Rumination  History of Schizophrenia/Schizoaffective disorder:Yes  Duration of Psychotic Symptoms:Greater than six months  Hallucinations:Hallucinations: Auditory Description of Auditory Hallucinations: whispers   Ideas of Reference:None  Suicidal Thoughts:yes  Homicidal Thoughts:denies   Sensorium  Memory: Immediate Fair; Recent Fair; Remote Fair  Judgment: Poor  Insight: Poor   Executive Functions  Concentration: Fair  Attention Span: Fair  Recall: Good  Fund of Knowledge: Good  Language: Good   Psychomotor Activity  Psychomotor Activity: retardation   Assets  Assets: Communication Skills; Physical Health;  Desire for Improvement; Financial Resources/Insurance; Social Support   Sleep  Sleep: fair   Physical Exam: Physical Exam Constitutional:      Appearance: Normal appearance.  HENT:     Head: Normocephalic and atraumatic.     Nose: Nose normal.  Eyes:     Pupils: Pupils are equal, round, and reactive to light.  Pulmonary:     Effort: Pulmonary effort is normal.  Skin:    General: Skin is warm and dry.  Neurological:     General: No focal deficit present.  Mental Status: She is alert and oriented to person, place, and time.  Psychiatric:        Attention and Perception: Attention normal. She perceives auditory hallucinations.        Mood and Affect: Mood is anxious. Affect is blunt.        Speech: Speech normal.        Behavior: Behavior normal. Behavior is cooperative.        Thought Content: Thought content includes suicidal ideation.        Cognition and Memory: Cognition and memory normal.        Judgment: Judgment is impulsive.    Review of Systems  Constitutional:  Positive for malaise/fatigue.  HENT: Negative.    Eyes: Negative.   Respiratory: Negative.    Cardiovascular: Negative.   Gastrointestinal: Negative.   Neurological: Negative.   Psychiatric/Behavioral:  Positive for depression. Negative for suicidal ideas. The patient is nervous/anxious and has insomnia.   All other systems reviewed and are negative.  Blood pressure 110/62, pulse 94, temperature 98.1 F (36.7 C), temperature source Oral, resp. rate 18, height 5\' 5"  (1.651 m), weight 101.1 kg, last menstrual period 08/08/2022, SpO2 99%. Body mass index is 37.09 kg/m.  Treatment Plan Summary: Daily contact with patient to assess and evaluate symptoms and progress in treatment and Medication management Plan/Recommendations: -Continue 1:1 sitter for safety -Continue current medications. Continue olanzapine 10mg  po daily for psychosis.  -Consider TOC/Social worker consult to facilitate psych  inpatient admission after patient is medically stable.  Consider [facility] referral.  --[facility] referral for self neglect/abuse, inability to keep self safe in the community.   Disposition: Recommend psychiatric Inpatient admission when medically cleared. Supportive therapy provided about ongoing stressors.  Maryagnes Amos, FNP

## 2022-08-27 NOTE — Progress Notes (Incomplete)
Patient was observed to be scratching her left upper arm vigorously while sitter tries to stop her. When asked why she was doing that she responded " I want to kill myself by having an infection". The site was observed to be bruised and bleeding mildly. She refused pressure dressing. Sitter stayed at bedside with patient while RN informed provider. bilateral soft restrained was ordered.   Addendum: Patient refused

## 2022-08-27 NOTE — Progress Notes (Signed)
PROGRESS NOTE  [name] HYQ:[id number] DOB: January 01, 1991   PCP: [name]  Patient is from: Home  DOA: 08/22/2022 LOS: 5  Chief complaints Chief Complaint  Patient presents with   Drug Overdose     Brief Narrative / Interim history: 32 y.o. F with PMH of adjustment disorder with mixed anxiety and depressed mood, autism, bipolar 1 disorder, fragile x syndrome, seizures not on meds, schizoaffective disorder and multiple suicide attempts in the last year who presented to the ED after ingesting numerous medications in a suicide attempt.  She reported taking #30 metoprolol 25mg  tablets, #60 Hydroxyzine 25mg , #7 Lexapro 20mg , #21 Gabapentin 600mg , #7 Flexeril 10mg , #7 protonix 40mg  and #7 Prazosin 1mg .   She was slightly bradycardic and hypotensive on EMS arrival which improved with fluids.  Poison control was contacted and as she was within 2hrs of ingestion was given activated charcoal.  Placed under IVC in ED.  Poison control also recommended ICU admission for close Qtc and electrolyte monitoring and possible pressors.  At the time of admission, pt was somnolent but arousable to voice and following commands. She was intubated and started on vasopressor and glucagon.  Also started on ceftriaxone for possible pneumonia.  Patient was extubated on 7/18 and transferred to the floor on 7/19.  Psychiatry recommended inpatient psychiatric hospitalization.  Psychiatry following.    Subjective: Seen and examined earlier this morning.  No major events overnight.  Hide no complaints.  Requested her Voltaren increased to every 6 hours.  Denies auditory hallucinations this morning.  However, she had episode of aggression later in the morning but calmed down quickly.   Objective: Vitals:   08/27/22 0455 08/27/22 0827 08/27/22 0840 08/27/22 1257  BP: 101/81 110/62    Pulse:  93  100  Resp: 18 20    Temp: 98.1 F (36.7 C) 98.5 F (36.9 C)  98.3 F (36.8 C)  TempSrc: Oral Oral   Oral  SpO2: 99% 99% 99%   Weight:      Height:        Examination:  GENERAL: No apparent distress.  Nontoxic. HEENT: MMM.  Vision and hearing grossly intact.  NECK: Supple.  No apparent JVD.  RESP:  No IWOB.  Fair aeration bilaterally. CVS:  RRR. Heart sounds normal.  ABD/GI/GU: BS+. Abd soft, NTND.  MSK/EXT:   No apparent deformity. Moves extremities. No edema.  SKIN: no apparent skin lesion or wound NEURO: Awake and alert. Oriented appropriately.  No apparent focal neuro deficit.  PSYCH: Calm. Normal affect.  Denies hallucination.  Procedures:  7/17-18: Intubation and mechanical ventilation  Microbiology summarized: MRSA PCR screen positive Blood cultures NGTD  Assessment and plan: Active Problems:   Asthma   GAD (generalized anxiety disorder)   Suicide attempt (HCC)   Bipolar I disorder, current or most recent episode depressed, with psychotic features (HCC)   Overdose   Drug overdose   Suicidal attempt with intentional overdose: Overdose with multiple medications including #30 metoprolol 25mg  tablets, #60 Hydroxyzine 25mg , #7 Lexapro 20mg , #21 Gabapentin 600mg , #7 Flexeril 10mg , #7 protonix 40mg  and #7 Prazosin 1mg .  Poison control consulted. Received charcoal. Denies ETOH or illicit drug use.  Extended UDS negative. -Required vasopressor, glucagon, intubation and mechanical ventilation 7/17-7/18.  Room air and hemodynamically stable. -Placed under IVC in the ED. -Continue one-to-one safety sitter -Patient is medically stable  Anxiety and Depression, Schizoaffective Disorder, Adjustment Disorder, bipolar disorder, fibromyalgia and auditory hallucination: Hallucinations seems to have resolved.  Patient is on  Cogentin, Zyprexa, Atarax, prazosin and Lexapro at home -Psychiatry following -Home meds resumed except Lexapro since she has been started on Cymbalta this hospitalization. -Continue Zyprexa as needed agitation  CAP: No respiratory symptoms.  CXR showed  atelectasis or infiltrate in left midlung.  Pro-Cal elevated. -Continue ceftriaxone  HTN: Normotensive. -Resume home Lopressor  Sinus tachycardia: Resolved. -Continue home metoprolol.  Metabolic acidosis: Likely from IV fluid -P.o. sodium bicarbonate. -Recheck in the morning  Hypomagnesemia -Monitor replenish as appropriate  Microcytic anemia: H&H relatively stable.  Anemia panel with some iron deficiency with elevated TIBC and low ferritin. Recent Labs    07/29/22 0423 08/04/22 2122 08/09/22 0216 08/20/22 2040 08/22/22 1420 08/22/22 1536 08/22/22 1934 08/23/22 0422 08/25/22 0726 08/26/22 0952  HGB 11.0* 11.2* 10.1* 11.9* 10.5* 9.4* 10.5* 9.6* 9.9* 9.2*  -P.o. ferrous sulfate with bowel regimen  History of seizure disorder: Stable.  Not on meds.  History of asthma: Stable. -Continue home meds  Dysuria: Patient reported dysuria this morning.  She is already on antibiotics.  Likely due to catheterization while in ICU. -Pyridium 200 mg 3 times daily for 2 days  GERD -Continue PPI and Pepcid  Class II obesity Body mass index is 37.09 kg/m. -Encourage lifestyle change to lose weight.          DVT prophylaxis:  SCDs Start: 08/22/22 1514  Code Status: Full code Family Communication: None at bedside Level of care: Med-Surg Status is: Inpatient Remains inpatient appropriate because: Due to suicidal attempt with overdose   Final disposition: Inpatient psychiatric hospitalization Consultants:  Pulmonology admitted patient Psychiatry  55 minutes with more than 50% spent in reviewing records, counseling patient/family and coordinating care.   Sch Meds:  Scheduled Meds:  amoxicillin-clavulanate  1 tablet Oral Q12H   benztropine  1 mg Oral QHS   Chlorhexidine Gluconate Cloth  6 each Topical Q0600   cyclobenzaprine  10 mg Oral QHS   diclofenac Sodium  2 g Topical QID   DULoxetine  30 mg Oral Daily   famotidine  20 mg Oral BID   ferrous sulfate  325 mg  Oral BID WC   gabapentin  600 mg Oral TID   metoprolol tartrate  12.5 mg Oral BID   mometasone-formoterol  2 puff Inhalation BID   mupirocin ointment  1 Application Nasal BID   OLANZapine  10 mg Oral QHS   pantoprazole  40 mg Oral QAC breakfast   phenazopyridine  200 mg Oral TID WC   prazosin  1 mg Oral QHS   sodium bicarbonate  650 mg Oral TID   Continuous Infusions:  sodium chloride Stopped (08/24/22 1347)   PRN Meds:.albuterol, hydrOXYzine, LORazepam, OLANZapine, mouth rinse, polyethylene glycol, senna-docusate, traZODone  Antimicrobials: Anti-infectives (From admission, onward)    Start     Dose/Rate Route Frequency Ordered Stop   08/26/22 1415  amoxicillin-clavulanate (AUGMENTIN) 875-125 MG per tablet 1 tablet        1 tablet Oral Every 12 hours 08/26/22 1316 08/28/22 0959   08/23/22 1200  cefTRIAXone (ROCEPHIN) 2 g in sodium chloride 0.9 % 100 mL IVPB  Status:  Discontinued        2 g 200 mL/hr over 30 Minutes Intravenous Every 24 hours 08/23/22 0931 08/26/22 1316        I have personally reviewed the following labs and images: CBC: Recent Labs  Lab 08/20/22 2040 08/22/22 1420 08/22/22 1536 08/22/22 1934 08/23/22 0422 08/25/22 0726 08/26/22 0952  WBC 8.2 8.8 9.0  --  16.1* 6.4  6.8  NEUTROABS 5.2 5.4  --   --   --   --   --   HGB 11.9* 10.5* 9.4* 10.5* 9.6* 9.9* 9.2*  HCT 38.5 34.1* 31.8* 31.0* 31.2* 31.9* 30.4*  MCV 80.2 80.6 81.1  --  78.4* 79.4* 77.6*  PLT 372 321 270  --  305 272 285   BMP &GFR Recent Labs  Lab 08/22/22 1420 08/22/22 1536 08/22/22 1934 08/22/22 2337 08/23/22 0422 08/25/22 0726 08/26/22 0952  NA 133*  --  137 136 132* 137 135  K 4.0  --  4.3 4.1 3.6 3.9 4.1  CL 105  --   --  107 105 108 105  CO2 21*  --   --  21* 20* 17* 17*  GLUCOSE 109*  --   --  188* 130* 84 88  BUN 11  --   --  8 7 19 15   CREATININE 0.74 0.66  --  0.61 0.54 0.91 0.84  CALCIUM 8.4*  --   --  8.2* 7.9* 8.7* 8.8*  MG 2.0  --   --  1.9 1.9 1.5* 1.7  PHOS  --    --   --   --  2.7  --  3.0   Estimated Creatinine Clearance: 112.2 mL/min (by C-G formula based on SCr of 0.84 mg/dL). Liver & Pancreas: Recent Labs  Lab 08/20/22 2040 08/22/22 1420 08/23/22 0422 08/26/22 0952  AST 20 16 17   --   ALT 19 14 14   --   ALKPHOS 84 73 67  --   BILITOT 0.1* 0.3 0.5  --   PROT 7.7 6.7 5.9*  --   ALBUMIN 3.9 3.5 3.0* 3.2*   No results for input(s): "LIPASE", "AMYLASE" in the last 168 hours. No results for input(s): "AMMONIA" in the last 168 hours. Diabetic: No results for input(s): "HGBA1C" in the last 72 hours. Recent Labs  Lab 08/24/22 1135 08/24/22 1610 08/24/22 2041 08/24/22 2335 08/25/22 0730  GLUCAP 107* 102* 76 119* 93   Cardiac Enzymes: No results for input(s): "CKTOTAL", "CKMB", "CKMBINDEX", "TROPONINI" in the last 168 hours. No results for input(s): "PROBNP" in the last 8760 hours. Coagulation Profile: No results for input(s): "INR", "PROTIME" in the last 168 hours. Thyroid Function Tests: No results for input(s): "TSH", "T4TOTAL", "FREET4", "T3FREE", "THYROIDAB" in the last 72 hours. Lipid Profile: No results for input(s): "CHOL", "HDL", "LDLCALC", "TRIG", "CHOLHDL", "LDLDIRECT" in the last 72 hours.  Anemia Panel: Recent Labs    08/25/22 0726 08/25/22 1100  VITAMINB12 1,205*  --   FOLATE 11.6  --   FERRITIN  --  18  TIBC  --  420  IRON  --  58  RETICCTPCT 1.1  --    Urine analysis:    Component Value Date/Time   COLORURINE YELLOW 03/03/2022 1813   APPEARANCEUR HAZY (A) 03/03/2022 1813   LABSPEC 1.017 03/03/2022 1813   PHURINE 5.0 03/03/2022 1813   GLUCOSEU NEGATIVE 03/03/2022 1813   HGBUR NEGATIVE 03/03/2022 1813   BILIRUBINUR NEGATIVE 03/03/2022 1813   BILIRUBINUR negative 06/14/2015 0930   KETONESUR 5 (A) 03/03/2022 1813   PROTEINUR NEGATIVE 03/03/2022 1813   UROBILINOGEN 0.2 10/24/2015 1428   NITRITE NEGATIVE 03/03/2022 1813   LEUKOCYTESUR NEGATIVE 03/03/2022 1813   Sepsis Labs: Invalid input(s):  "PROCALCITONIN", "LACTICIDVEN"  Microbiology: Recent Results (from the past 240 hour(s))  MRSA Next Gen by PCR, Nasal     Status: Abnormal   Collection Time: 08/22/22  4:24 PM   Specimen: Nasal  Mucosa; Nasal Swab  Result Value Ref Range Status   MRSA by PCR Next Gen DETECTED (A) NOT DETECTED Final    Comment: RESULT CALLED TO, READ BACK BY AND VERIFIED WITH: rn s. ZOXWR [id number] @ 1933 fh (NOTE) The GeneXpert MRSA Assay (FDA approved for NASAL specimens only), is one component of a comprehensive MRSA colonization surveillance program. It is not intended to diagnose MRSA infection nor to guide or monitor treatment for MRSA infections. Test performance is not FDA approved in patients less than 64 years old. Performed at [facility], [address], [place], [place] [address]   Culture, blood (Routine X 2) w Reflex to ID Panel     Status: None (Preliminary result)   Collection Time: 08/23/22  9:28 AM   Specimen: BLOOD RIGHT HAND  Result Value Ref Range Status   Specimen Description BLOOD RIGHT HAND  Final   Special Requests   Final    BOTTLES DRAWN AEROBIC AND ANAEROBIC Blood Culture adequate volume   Culture   Final    NO GROWTH 4 DAYS Performed at [facility], [address], [place], [place] [address]    Report Status PENDING  Incomplete  Culture, blood (Routine X 2) w Reflex to ID Panel     Status: None (Preliminary result)   Collection Time: 08/23/22  9:28 AM   Specimen: BLOOD LEFT HAND  Result Value Ref Range Status   Specimen Description BLOOD LEFT HAND  Final   Special Requests   Final    BOTTLES DRAWN AEROBIC ONLY Blood Culture adequate volume   Culture   Final    NO GROWTH 4 DAYS Performed at [facility], [address] [place], [place] [address]    Report Status PENDING  Incomplete    Radiology Studies: No results found.    [name] T. Deavion Dobbs [facility]  If 7PM-7AM, please contact night-coverage [email or web address] 08/27/2022, 2:58 PM

## 2022-08-27 NOTE — Plan of Care (Signed)
Problem: Education: Goal: Knowledge of General Education information will improve Description: Including pain rating scale, medication(s)/side effects and non-pharmacologic comfort measures 08/27/2022 1122 by Eugene Garnet, RN Outcome: Progressing 08/27/2022 1122 by Eugene Garnet, RN Outcome: Progressing   Problem: Health Behavior/Discharge Planning: Goal: Ability to manage health-related needs will improve 08/27/2022 1122 by Eugene Garnet, RN Outcome: Progressing 08/27/2022 1122 by Eugene Garnet, RN Outcome: Progressing   Problem: Clinical Measurements: Goal: Ability to maintain clinical measurements within normal limits will improve 08/27/2022 1122 by Eugene Garnet, RN Outcome: Progressing 08/27/2022 1122 by Eugene Garnet, RN Outcome: Progressing Goal: Will remain free from infection 08/27/2022 1122 by Eugene Garnet, RN Outcome: Progressing 08/27/2022 1122 by Eugene Garnet, RN Outcome: Progressing Goal: Diagnostic test results will improve 08/27/2022 1122 by Eugene Garnet, RN Outcome: Progressing 08/27/2022 1122 by Eugene Garnet, RN Outcome: Progressing Goal: Cardiovascular complication will be avoided 08/27/2022 1122 by Eugene Garnet, RN Outcome: Progressing 08/27/2022 1122 by Eugene Garnet, RN Outcome: Progressing   Problem: Activity: Goal: Risk for activity intolerance will decrease 08/27/2022 1122 by Eugene Garnet, RN Outcome: Progressing 08/27/2022 1122 by Eugene Garnet, RN Outcome: Progressing   Problem: Coping: Goal: Level of anxiety will decrease 08/27/2022 1122 by Eugene Garnet, RN Outcome: Progressing 08/27/2022 1122 by Eugene Garnet, RN Outcome: Progressing   Problem: Elimination: Goal: Will not experience complications related to bowel motility 08/27/2022 1122 by Eugene Garnet, RN Outcome: Progressing 08/27/2022 1122 by Eugene Garnet, RN Outcome: Progressing   Problem:  Pain Managment: Goal: General experience of comfort will improve 08/27/2022 1122 by Eugene Garnet, RN Outcome: Progressing 08/27/2022 1122 by Eugene Garnet, RN Outcome: Progressing   Problem: Safety: Goal: Ability to remain free from injury will improve 08/27/2022 1122 by Eugene Garnet, RN Outcome: Progressing 08/27/2022 1122 by Eugene Garnet, RN Outcome: Progressing   Problem: Skin Integrity: Goal: Risk for impaired skin integrity will decrease 08/27/2022 1122 by Eugene Garnet, RN Outcome: Progressing 08/27/2022 1122 by Eugene Garnet, RN Outcome: Progressing   Problem: Role Relationship: Goal: Method of communication will improve 08/27/2022 1122 by Eugene Garnet, RN Outcome: Progressing 08/27/2022 1122 by Eugene Garnet, RN Outcome: Progressing   Problem: Coping: Goal: Ability to adjust to condition or change in health will improve 08/27/2022 1122 by Eugene Garnet, RN Outcome: Progressing 08/27/2022 1122 by Eugene Garnet, RN Outcome: Progressing   Problem: Fluid Volume: Goal: Ability to maintain a balanced intake and output will improve 08/27/2022 1122 by Eugene Garnet, RN Outcome: Progressing 08/27/2022 1122 by Eugene Garnet, RN Outcome: Progressing   Problem: Health Behavior/Discharge Planning: Goal: Ability to identify and utilize available resources and services will improve 08/27/2022 1122 by Eugene Garnet, RN Outcome: Progressing 08/27/2022 1122 by Eugene Garnet, RN Outcome: Progressing Goal: Ability to manage health-related needs will improve 08/27/2022 1122 by Eugene Garnet, RN Outcome: Progressing 08/27/2022 1122 by Eugene Garnet, RN Outcome: Progressing   Problem: Metabolic: Goal: Ability to maintain appropriate glucose levels will improve 08/27/2022 1122 by Eugene Garnet, RN Outcome: Progressing 08/27/2022 1122 by Eugene Garnet, RN Outcome: Progressing   Problem:  Nutritional: Goal: Maintenance of adequate nutrition will improve 08/27/2022 1122 by Eugene Garnet, RN Outcome: Progressing 08/27/2022 1122 by Eugene Garnet, RN Outcome: Progressing Goal: Progress toward achieving an optimal weight will improve 08/27/2022 1122 by Eugene Garnet, RN Outcome: Progressing 08/27/2022 1122 by Eugene Garnet, RN Outcome: Progressing   Problem: Skin Integrity: Goal: Risk for impaired skin integrity will decrease 08/27/2022 1122 by Eugene Garnet, RN Outcome: Progressing 08/27/2022 1122 by Eugene Garnet, RN Outcome: Progressing   Problem: Tissue Perfusion: Goal:  Adequacy of tissue perfusion will improve 08/27/2022 1122 by Eugene Garnet, RN Outcome: Progressing 08/27/2022 1122 by Eugene Garnet, RN Outcome: Progressing

## 2022-08-28 DIAGNOSIS — T1491XA Suicide attempt, initial encounter: Secondary | ICD-10-CM | POA: Diagnosis not present

## 2022-08-28 DIAGNOSIS — F315 Bipolar disorder, current episode depressed, severe, with psychotic features: Secondary | ICD-10-CM | POA: Diagnosis not present

## 2022-08-28 DIAGNOSIS — J4520 Mild intermittent asthma, uncomplicated: Secondary | ICD-10-CM | POA: Diagnosis not present

## 2022-08-28 DIAGNOSIS — T50902A Poisoning by unspecified drugs, medicaments and biological substances, intentional self-harm, initial encounter: Secondary | ICD-10-CM | POA: Diagnosis not present

## 2022-08-28 LAB — RENAL FUNCTION PANEL
Albumin: 4.1 g/dL (ref 3.5–5.0)
Anion gap: 10 (ref 5–15)
BUN: 14 mg/dL (ref 6–20)
CO2: 23 mmol/L (ref 22–32)
Calcium: 9.6 mg/dL (ref 8.9–10.3)
Chloride: 104 mmol/L (ref 98–111)
Creatinine, Ser: 0.8 mg/dL (ref 0.44–1.00)
GFR, Estimated: >60 mL/min (ref >60)
Glucose, Bld: 98 mg/dL (ref 70–99)
Phosphorus: 3 mg/dL (ref 2.5–4.6)
Potassium: 3.9 mmol/L (ref 3.5–5.1)
Sodium: 137 mmol/L (ref 135–145)

## 2022-08-28 LAB — CBC
HCT: 38.4 % (ref 36.0–46.0)
Hemoglobin: 11.7 g/dL — ABNORMAL LOW (ref 12.0–15.0)
MCH: 24.4 pg — ABNORMAL LOW (ref 26.0–34.0)
MCHC: 30.5 g/dL (ref 30.0–36.0)
MCV: 80 fL (ref 80.0–100.0)
Platelets: 345 10*3/uL (ref 150–400)
RBC: 4.8 MIL/uL (ref 3.87–5.11)
RDW: 15.3 % (ref 11.5–15.5)
WBC: 11.6 10*3/uL — ABNORMAL HIGH (ref 4.0–10.5)
nRBC: 0 % (ref 0.0–0.2)

## 2022-08-28 LAB — CULTURE, BLOOD (ROUTINE X 2): Special Requests: ADEQUATE

## 2022-08-28 LAB — MAGNESIUM: Magnesium: 1.8 mg/dL (ref 1.7–2.4)

## 2022-08-28 NOTE — Progress Notes (Signed)
PROGRESS NOTE  [name] OZH:[id number] DOB: Jun 05, 1990   PCP: [name]  Patient is from: Home  DOA: 08/22/2022 LOS: 6  Chief complaints Chief Complaint  Patient presents with   Drug Overdose     Brief Narrative / Interim history: 32 y.o. F with PMH of adjustment disorder with mixed anxiety and depressed mood, autism, bipolar 1 disorder, fragile x syndrome, seizures not on meds, schizoaffective disorder and multiple suicide attempts in the last year who presented to the ED after ingesting numerous medications in a suicide attempt.  She reported taking #30 metoprolol 25mg  tablets, #60 Hydroxyzine 25mg , #7 Lexapro 20mg , #21 Gabapentin 600mg , #7 Flexeril 10mg , #7 protonix 40mg  and #7 Prazosin 1mg .   She was slightly bradycardic and hypotensive on EMS arrival which improved with fluids.  Poison control was contacted and as she was within 2hrs of ingestion was given activated charcoal.  Placed under IVC in ED.  Poison control also recommended ICU admission for close Qtc and electrolyte monitoring and possible pressors.  At the time of admission, pt was somnolent but arousable to voice and following commands. She was intubated and started on vasopressor and glucagon.  Also started on ceftriaxone for possible pneumonia.  Patient was extubated on 7/18 and transferred to the floor on 7/19.  Psychiatry recommended inpatient psychiatric hospitalization, and placed APS orders.  Medically stable for discharge.    Subjective: Seen and examined earlier this morning.  Per overnight report, observed  scratching her left arm vigorously while seated tries to stop her overnight.  When asked, she said "I want to kill myself by having an infection".  Restraints ordered but she stopped scratching.  No complaints this morning.  Endorses some auditory hallucination but improved.  Objective: Vitals:   08/28/22 0146 08/28/22 0445 08/28/22 0500 08/28/22 0727  BP: 130/63 99/81  124/62   Pulse: 92 85  91  Resp: 16 16  18   Temp: 98.2 F (36.8 C) 97.6 F (36.4 C)  98.1 F (36.7 C)  TempSrc: Axillary   Oral  SpO2:    92%  Weight:   97.3 kg   Height:        Examination:  GENERAL: No apparent distress.  Nontoxic. HEENT: MMM.  Vision and hearing grossly intact.  NECK: Supple.  No apparent JVD.  RESP:  No IWOB.  Fair aeration bilaterally. CVS:  RRR. Heart sounds normal.  ABD/GI/GU: BS+. Abd soft, NTND.  MSK/EXT:   No apparent deformity. Moves extremities. No edema.  SKIN: Skin bruises and left arm from scratching. NEURO: Awake and alert. Oriented appropriately.  No apparent focal neuro deficit.  PSYCH: Calm. Normal affect.  Some auditory hallucination but improved.  Procedures:  7/17-18: Intubation and mechanical ventilation  Microbiology summarized: MRSA PCR screen positive Blood cultures NGTD  Assessment and plan: Active Problems:   Asthma   GAD (generalized anxiety disorder)   Suicide attempt (HCC)   Bipolar I disorder, current or most recent episode depressed, with psychotic features (HCC)   Overdose   Drug overdose   Suicidal attempt with intentional overdose: Overdose with multiple medications including #30 metoprolol 25mg  tablets, #60 Hydroxyzine 25mg , #7 Lexapro 20mg , #21 Gabapentin 600mg , #7 Flexeril 10mg , #7 protonix 40mg  and #7 Prazosin 1mg .  Poison control consulted. Received charcoal. Denies ETOH or illicit drug use.  Extended UDS negative. -Required vasopressor, glucagon, intubation and mechanical ventilation 7/17-7/18.  Room air and hemodynamically stable. -Placed under IVC in the ED. -Continue one-to-one safety sitter -Patient is medically stable pending  inpatient psych bed -Psychiatry placed APS referral as well.  Anxiety and Depression, Schizoaffective Disorder, Adjustment Disorder, bipolar disorder, fibromyalgia and auditory hallucination: Hallucinations seems to have resolved.  Patient is on Cogentin, Zyprexa, Atarax, prazosin and  Lexapro at home -Appreciate input by psych -Continue home meds.  Lexapro changed to Cymbalta -Zyprexa as needed  CAP: No respiratory symptoms.  CXR showed atelectasis or infiltrate in left midlung.  Pro-Cal elevated. -Received ceftriaxone. -Complete course with p.o. Augmentin.  HTN: Normotensive. -Resume home Lopressor  Sinus tachycardia: Resolved. -Continue home metoprolol.  Metabolic acidosis: Likely from IV fluid -P.o. sodium bicarbonate. -Recheck in the morning  Hypomagnesemia -Monitor replenish as appropriate  Microcytic anemia: H&H relatively stable.  Anemia panel with some iron deficiency with elevated TIBC and low ferritin. Recent Labs    08/04/22 2122 08/09/22 0216 08/20/22 2040 08/22/22 1420 08/22/22 1536 08/22/22 1934 08/23/22 0422 08/25/22 0726 08/26/22 0952 08/28/22 1119  HGB 11.2* 10.1* 11.9* 10.5* 9.4* 10.5* 9.6* 9.9* 9.2* 11.7*  -P.o. ferrous sulfate with bowel regimen  History of seizure disorder: Stable.  Not on meds.  History of asthma: Stable. -Continue home meds  Dysuria: Patient reported dysuria this morning.  She is already on antibiotics.  Likely due to catheterization while in ICU. -Pyridium 200 mg 3 times daily for 2 days  GERD -Continue PPI and Pepcid  Class II obesity Body mass index is 35.7 kg/m. -Encourage lifestyle change to lose weight.          DVT prophylaxis:  SCDs Start: 08/22/22 1514  Code Status: Full code Family Communication: None at bedside Level of care: Med-Surg Status is: Inpatient Remains inpatient appropriate because: Due to suicidal attempt with overdose   Final disposition: Inpatient psychiatric hospitalization Consultants:  Pulmonology admitted patient Psychiatry  35 minutes with more than 50% spent in reviewing records, counseling patient/family and coordinating care.   Sch Meds:  Scheduled Meds:  benztropine  1 mg Oral QHS   cyclobenzaprine  10 mg Oral QHS   diclofenac Sodium  2 g  Topical QID   DULoxetine  30 mg Oral Daily   famotidine  20 mg Oral BID   ferrous sulfate  325 mg Oral BID WC   gabapentin  600 mg Oral TID   metoprolol tartrate  12.5 mg Oral BID   mometasone-formoterol  2 puff Inhalation BID   OLANZapine  10 mg Oral QHS   pantoprazole  40 mg Oral QAC breakfast   phenazopyridine  200 mg Oral TID WC   prazosin  1 mg Oral QHS   sodium bicarbonate  650 mg Oral TID   Continuous Infusions:  sodium chloride Stopped (08/24/22 1347)   PRN Meds:.albuterol, hydrOXYzine, ibuprofen, LORazepam, OLANZapine, mouth rinse, polyethylene glycol, senna-docusate, traZODone  Antimicrobials: Anti-infectives (From admission, onward)    Start     Dose/Rate Route Frequency Ordered Stop   08/26/22 1415  amoxicillin-clavulanate (AUGMENTIN) 875-125 MG per tablet 1 tablet        1 tablet Oral Every 12 hours 08/26/22 1316 08/27/22 2205   08/23/22 1200  cefTRIAXone (ROCEPHIN) 2 g in sodium chloride 0.9 % 100 mL IVPB  Status:  Discontinued        2 g 200 mL/hr over 30 Minutes Intravenous Every 24 hours 08/23/22 0931 08/26/22 1316        I have personally reviewed the following labs and images: CBC: Recent Labs  Lab 08/22/22 1420 08/22/22 1536 08/22/22 1934 08/23/22 0422 08/25/22 0726 08/26/22 0952 08/28/22 1119  WBC 8.8 9.0  --  16.1* 6.4 6.8 11.6*  NEUTROABS 5.4  --   --   --   --   --   --   HGB 10.5* 9.4* 10.5* 9.6* 9.9* 9.2* 11.7*  HCT 34.1* 31.8* 31.0* 31.2* 31.9* 30.4* 38.4  MCV 80.6 81.1  --  78.4* 79.4* 77.6* 80.0  PLT 321 270  --  305 272 285 345   BMP &GFR Recent Labs  Lab 08/22/22 1420 08/22/22 1536 08/22/22 1934 08/22/22 2337 08/23/22 0422 08/25/22 0726 08/26/22 0952  NA 133*  --  137 136 132* 137 135  K 4.0  --  4.3 4.1 3.6 3.9 4.1  CL 105  --   --  107 105 108 105  CO2 21*  --   --  21* 20* 17* 17*  GLUCOSE 109*  --   --  188* 130* 84 88  BUN 11  --   --  8 7 19 15   CREATININE 0.74 0.66  --  0.61 0.54 0.91 0.84  CALCIUM 8.4*  --   --   8.2* 7.9* 8.7* 8.8*  MG 2.0  --   --  1.9 1.9 1.5* 1.7  PHOS  --   --   --   --  2.7  --  3.0   Estimated Creatinine Clearance: 109.9 mL/min (by C-G formula based on SCr of 0.84 mg/dL). Liver & Pancreas: Recent Labs  Lab 08/22/22 1420 08/23/22 0422 08/26/22 0952  AST 16 17  --   ALT 14 14  --   ALKPHOS 73 67  --   BILITOT 0.3 0.5  --   PROT 6.7 5.9*  --   ALBUMIN 3.5 3.0* 3.2*   No results for input(s): "LIPASE", "AMYLASE" in the last 168 hours. No results for input(s): "AMMONIA" in the last 168 hours. Diabetic: No results for input(s): "HGBA1C" in the last 72 hours. Recent Labs  Lab 08/24/22 1135 08/24/22 1610 08/24/22 2041 08/24/22 2335 08/25/22 0730  GLUCAP 107* 102* 76 119* 93   Cardiac Enzymes: No results for input(s): "CKTOTAL", "CKMB", "CKMBINDEX", "TROPONINI" in the last 168 hours. No results for input(s): "PROBNP" in the last 8760 hours. Coagulation Profile: No results for input(s): "INR", "PROTIME" in the last 168 hours. Thyroid Function Tests: No results for input(s): "TSH", "T4TOTAL", "FREET4", "T3FREE", "THYROIDAB" in the last 72 hours. Lipid Profile: No results for input(s): "CHOL", "HDL", "LDLCALC", "TRIG", "CHOLHDL", "LDLDIRECT" in the last 72 hours.  Anemia Panel: No results for input(s): "VITAMINB12", "FOLATE", "FERRITIN", "TIBC", "IRON", "RETICCTPCT" in the last 72 hours.  Urine analysis:    Component Value Date/Time   COLORURINE YELLOW 03/03/2022 1813   APPEARANCEUR HAZY (A) 03/03/2022 1813   LABSPEC 1.017 03/03/2022 1813   PHURINE 5.0 03/03/2022 1813   GLUCOSEU NEGATIVE 03/03/2022 1813   HGBUR NEGATIVE 03/03/2022 1813   BILIRUBINUR NEGATIVE 03/03/2022 1813   BILIRUBINUR negative 06/14/2015 0930   KETONESUR 5 (A) 03/03/2022 1813   PROTEINUR NEGATIVE 03/03/2022 1813   UROBILINOGEN 0.2 10/24/2015 1428   NITRITE NEGATIVE 03/03/2022 1813   LEUKOCYTESUR NEGATIVE 03/03/2022 1813   Sepsis Labs: Invalid input(s): "PROCALCITONIN",  "LACTICIDVEN"  Microbiology: Recent Results (from the past 240 hour(s))  MRSA Next Gen by PCR, Nasal     Status: Abnormal   Collection Time: 08/22/22  4:24 PM   Specimen: Nasal Mucosa; Nasal Swab  Result Value Ref Range Status   MRSA by PCR Next Gen DETECTED (A) NOT DETECTED Final    Comment: RESULT CALLED TO, READ BACK BY AND VERIFIED WITH:  rn [email or web address] Plater [id number] @ 1933 fh (NOTE) The GeneXpert MRSA Assay (FDA approved for NASAL specimens only), is one component of a comprehensive MRSA colonization surveillance program. It is not intended to diagnose MRSA infection nor to guide or monitor treatment for MRSA infections. Test performance is not FDA approved in patients less than 76 years old. Performed at [facility], [address], [place], [place] [address]   Culture, blood (Routine X 2) w Reflex to ID Panel     Status: None   Collection Time: 08/23/22  9:28 AM   Specimen: BLOOD RIGHT HAND  Result Value Ref Range Status   Specimen Description BLOOD RIGHT HAND  Final   Special Requests   Final    BOTTLES DRAWN AEROBIC AND ANAEROBIC Blood Culture adequate volume   Culture   Final    NO GROWTH 5 DAYS Performed at [facility], [address], [place], [place] [address]    Report Status 08/28/2022 FINAL  Final  Culture, blood (Routine X 2) w Reflex to ID Panel     Status: None   Collection Time: 08/23/22  9:28 AM   Specimen: BLOOD LEFT HAND  Result Value Ref Range Status   Specimen Description BLOOD LEFT HAND  Final   Special Requests   Final    BOTTLES DRAWN AEROBIC ONLY Blood Culture adequate volume   Culture   Final    NO GROWTH 5 DAYS Performed at [facility], [address], [place], [place] [address]    Report Status 08/28/2022 FINAL  Final    Radiology Studies: No results found.    Raelin Pixler T. Mayar Whittier [facility]  If 7PM-7AM, please contact night-coverage [email or web address] 08/28/2022, 12:14 PM

## 2022-08-28 NOTE — TOC Progression Note (Signed)
Transition of Care [facility]) - Progression Note    Patient Details  Name: [name] MRN: [id number] Date of Birth: Jun 26, 1990  Transition of Care [facility]) CM/SW Contact  Treyvon Blahut A [place], [place] Phone Number: 08/28/2022, 4:21 PM  Clinical Narrative:     CSW met with pt at bedside. She asked about referral update to [facility]. CSW stated they they are reviewing and CSW will update pt when decision has been made.   She said that she has her friend assisting her with getting items out of her apartment since she may lose housing if admitted to facility for more than 30 days. She said "she started over before" and said it would be ok.  TOC will continue to follow.        Expected Discharge Plan and Services                                               Social Determinants of Health (SDOH) Interventions SDOH Screenings   Food Insecurity: No Food Insecurity (07/29/2022)  Housing: Low Risk  (07/29/2022)  Transportation Needs: No Transportation Needs (07/29/2022)  Recent Concern: Transportation Needs - Unmet Transportation Needs (05/06/2022)  Utilities: Not At Risk (07/29/2022)  Alcohol Screen: Low Risk  (07/29/2022)  Depression (PHQ2-9): Medium Risk (03/10/2022)  Tobacco Use: Medium Risk (08/22/2022)    Readmission Risk Interventions    06/06/2022    3:38 PM  Readmission Risk Prevention Plan  Transportation Screening Complete  Medication Review (RN Care Manager) Complete  PCP or Specialist appointment within 3-5 days of discharge Complete  HRI or Home Care Consult Complete  SW Recovery Care/Counseling Consult Complete  Palliative Care Screening Not Applicable  Skilled Nursing Facility Not Applicable
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
## 2022-08-28 NOTE — Care Management Important Message (Signed)
Important Message  Patient Details  Name: Brynlynn Walko MRN: [id number] Date of Birth: July 18, 1990   Medicare Important Message Given:  Yes  Patient has a contact precaution order in place will mail a copy to the patient home address.    Corliss Coggeshall 08/28/2022, 4:33 PM

## 2022-08-28 NOTE — Plan of Care (Addendum)
Problem: Education: Goal: Knowledge of General Education information will improve Description: Including pain rating scale, medication(s)/side effects and non-pharmacologic comfort measures Outcome: Progressing Pt understands she was admitted into the hospital for OD/ SI attempt.  She is aware she is awaiting a bed at [facility] for inpatient Psych bed.  Until then she will be monitored 1:1 d/t SI attempt.  VS have been WNL thus far.   Problem: Clinical Measurements: Goal: Ability to maintain clinical measurements within normal limits will improve Outcome: Progressing VS have been WNL thus far.  Pt's albumin and Ca+ are on the low end today along with her H/H.  MD is aware.   Problem: Clinical Measurements: Goal: Will remain free from infection Outcome: Progressing S/Sx of infection monitored and assessed q4 hours.  Pt has remained afebrile thus far.  She currently is on contact precaution for MRSA infection per MD's orders.     Problem: Clinical Measurements: Goal: Cardiovascular complication will be avoided Outcome: Progressing VS have been WNL thus far.  Pt's albumin and Ca+ are on the low end today along with her H/H.  MD is aware.  Pt has not endorsed c/o CP. Chest tightness or palpitations thus far.   Problem: Activity: Goal: Risk for activity intolerance will decrease Outcome: Progressing Pt is independent of all her ADLs. She was observed OOB ambulating in her room with a steady gait.  Pt understands she was admitted into the hospital for OD/ SI attempt.  She is aware that she will be monitored 1:1 d/t SI attempt.   Problem: Coping: Goal: Level of anxiety will decrease Outcome: Progressing Pt has expressed anxiety r/t her hospitalization and life in general.  Chaplain came to offer support and spoke with pt.  Will continue to offer presence and support to pt as needed.  Problem: Elimination: Goal: Will not experience complications related to bowel  motility Outcome: Progressing Pt stated her LBM was on 08/27/2022.  She does not endorse c/o constipation or abdominal pain/ distention.   Problem: Pain Managment: Goal: General experience of comfort will improve Outcome: Progressing Pt has denied pain thus far.   Problem: Safety: Goal: Ability to remain free from injury will improve Outcome: Progressing Pt has remained free from falls thus far. Instructed pt to utilize RN call light for assistance. Hourly rounds performed. Bed in lowest position, locked with two upper side rails engaged. Belongings and call light within reach.  Pt understands she was admitted into the hospital for OD/ SI attempt.  She is aware she is awaiting a bed at [facility] for inpatient Psych bed.  Until then she will be monitored 1:1 d/t SI attempt.  Even though she has a 1:1 sitter pt still inflicts self harm to herself.  MD and all other staff are aware.   Problem: Nutritional: Goal: Maintenance of adequate nutrition will improve Outcome: Progressing Pt is on a regular diet per MD's orders.  She has been able to tolerate her diet thus far w/o s/sx of abdominal pain/ distention or n/v.             Problem: Skin Integrity: Goal: Risk for impaired skin integrity will decrease Outcome: Progressing Skin integrity monitored and assessed q-shift. Instructed pt to turn q2 hours to prevent further skin impairment. Tubes and drains assessed for device related pressure sores. Pt is continent of both her bowel and bladder.  Even though pt has a 1:1 sitter she still inflicts self harm to herself.  MD and all other  staff are aware.

## 2022-08-28 NOTE — TOC Progression Note (Signed)
Transition of Care [facility]) - Progression Note    Patient Details  Name: [name] MRN: [id number] Date of Birth: [birth year]  Transition of Care [facility]) CM/SW Contact  Nataliya Graig A [place], [place] Phone Number: 08/28/2022, 4:12 PM  Clinical Narrative:     CSW sent fax referral to [facility] for referral to inpatient psychiatric admission, [phone].   CSW spoke with Apolinar Junes, admission staff member at [facility], [phone]. He stated that they received the fax referral for pt. He stated that he would follow back up with CSW regarding decision on referral. CSW provided contact information for staff to reach back out regarding decision.   TOC will continue to follow.          Expected Discharge Plan and Services                                               Social Determinants of Health (SDOH) Interventions SDOH Screenings   Food Insecurity: No Food Insecurity (07/29/2022)  Housing: Low Risk  (07/29/2022)  Transportation Needs: No Transportation Needs (07/29/2022)  Recent Concern: Transportation Needs - Unmet Transportation Needs (05/06/2022)  Utilities: Not At Risk (07/29/2022)  Alcohol Screen: Low Risk  (07/29/2022)  Depression (PHQ2-9): Medium Risk (03/10/2022)  Tobacco Use: Medium Risk (08/22/2022)    Readmission Risk Interventions    06/06/2022    3:38 PM  Readmission Risk Prevention Plan  Transportation Screening Complete  Medication Review (RN Care Manager) Complete  PCP or Specialist appointment within 3-5 days of discharge Complete  HRI or Home Care Consult Complete  SW Recovery Care/Counseling Consult Complete  Palliative Care Screening Not Applicable  Skilled Nursing Facility Not Applicable

## 2022-08-28 NOTE — Plan of Care (Signed)
  Problem: Education: Goal: Knowledge of General Education information will improve Description: Including pain rating scale, medication(s)/side effects and non-pharmacologic comfort measures Outcome: Progressing   Problem: Health Behavior/Discharge Planning: Goal: Ability to manage health-related needs will improve Outcome: Progressing   Problem: Clinical Measurements: Goal: Ability to maintain clinical measurements within normal limits will improve Outcome: Progressing Goal: Will remain free from infection Outcome: Progressing Goal: Diagnostic test results will improve Outcome: Progressing Goal: Cardiovascular complication will be avoided Outcome: Progressing   Problem: Activity: Goal: Risk for activity intolerance will decrease Outcome: Progressing   Problem: Coping: Goal: Level of anxiety will decrease Outcome: Progressing   Problem: Elimination: Goal: Will not experience complications related to bowel motility Outcome: Progressing   Problem: Pain Managment: Goal: General experience of comfort will improve Outcome: Progressing   Problem: Safety: Goal: Ability to remain free from injury will improve Outcome: Progressing   Problem: Skin Integrity: Goal: Risk for impaired skin integrity will decrease Outcome: Progressing   Problem: Role Relationship: Goal: Method of communication will improve Outcome: Progressing   Problem: Coping: Goal: Ability to adjust to condition or change in health will improve Outcome: Progressing   Problem: Fluid Volume: Goal: Ability to maintain a balanced intake and output will improve Outcome: Progressing   Problem: Health Behavior/Discharge Planning: Goal: Ability to identify and utilize available resources and services will improve Outcome: Progressing Goal: Ability to manage health-related needs will improve Outcome: Progressing   Problem: Metabolic: Goal: Ability to maintain appropriate glucose levels will  improve Outcome: Progressing   Problem: Nutritional: Goal: Maintenance of adequate nutrition will improve Outcome: Progressing Goal: Progress toward achieving an optimal weight will improve Outcome: Progressing   Problem: Skin Integrity: Goal: Risk for impaired skin integrity will decrease Outcome: Progressing   Problem: Tissue Perfusion: Goal: Adequacy of tissue perfusion will improve Outcome: Progressing

## 2022-08-29 DIAGNOSIS — F319 Bipolar disorder, unspecified: Secondary | ICD-10-CM | POA: Diagnosis not present

## 2022-08-29 DIAGNOSIS — F411 Generalized anxiety disorder: Secondary | ICD-10-CM | POA: Diagnosis not present

## 2022-08-29 DIAGNOSIS — T50902A Poisoning by unspecified drugs, medicaments and biological substances, intentional self-harm, initial encounter: Secondary | ICD-10-CM | POA: Diagnosis not present

## 2022-08-29 DIAGNOSIS — T1491XA Suicide attempt, initial encounter: Secondary | ICD-10-CM | POA: Diagnosis not present

## 2022-08-29 DIAGNOSIS — F315 Bipolar disorder, current episode depressed, severe, with psychotic features: Secondary | ICD-10-CM | POA: Diagnosis not present

## 2022-08-29 DIAGNOSIS — J4520 Mild intermittent asthma, uncomplicated: Secondary | ICD-10-CM | POA: Diagnosis not present

## 2022-08-29 DIAGNOSIS — T50912A Poisoning by multiple unspecified drugs, medicaments and biological substances, intentional self-harm, initial encounter: Secondary | ICD-10-CM | POA: Diagnosis not present

## 2022-08-29 LAB — PHOSPHORUS: Phosphorus: 3.1 mg/dL (ref 2.5–4.6)

## 2022-08-29 LAB — COMPREHENSIVE METABOLIC PANEL
ALT: 16 U/L (ref 0–44)
AST: 20 U/L (ref 15–41)
Albumin: 3.3 g/dL — ABNORMAL LOW (ref 3.5–5.0)
Alkaline Phosphatase: 71 U/L (ref 38–126)
Anion gap: 7 (ref 5–15)
BUN: 12 mg/dL (ref 6–20)
CO2: 23 mmol/L (ref 22–32)
Calcium: 8.7 mg/dL — ABNORMAL LOW (ref 8.9–10.3)
Chloride: 106 mmol/L (ref 98–111)
Creatinine, Ser: 0.68 mg/dL (ref 0.44–1.00)
GFR, Estimated: >60 mL/min (ref >60)
Glucose, Bld: 111 mg/dL — ABNORMAL HIGH (ref 70–99)
Potassium: 3.7 mmol/L (ref 3.5–5.1)
Sodium: 136 mmol/L (ref 135–145)
Total Bilirubin: 0.2 mg/dL — ABNORMAL LOW (ref 0.3–1.2)
Total Protein: 6.5 g/dL (ref 6.5–8.1)

## 2022-08-29 LAB — CBC WITH DIFFERENTIAL/PLATELET
Abs Immature Granulocytes: 0.07 10*3/uL (ref 0.00–0.07)
Basophils Absolute: 0.1 10*3/uL (ref 0.0–0.1)
Basophils Relative: 1 %
Eosinophils Absolute: 0.1 10*3/uL (ref 0.0–0.5)
Eosinophils Relative: 1 %
HCT: 33 % — ABNORMAL LOW (ref 36.0–46.0)
Hemoglobin: 10.2 g/dL — ABNORMAL LOW (ref 12.0–15.0)
Immature Granulocytes: 1 %
Lymphocytes Relative: 31 %
Lymphs Abs: 2.6 10*3/uL (ref 0.7–4.0)
MCH: 23.8 pg — ABNORMAL LOW (ref 26.0–34.0)
MCHC: 30.9 g/dL (ref 30.0–36.0)
MCV: 77.1 fL — ABNORMAL LOW (ref 80.0–100.0)
Monocytes Absolute: 0.5 10*3/uL (ref 0.1–1.0)
Monocytes Relative: 7 %
Neutro Abs: 5 10*3/uL (ref 1.7–7.7)
Neutrophils Relative %: 59 %
Platelets: 265 10*3/uL (ref 150–400)
RBC: 4.28 MIL/uL (ref 3.87–5.11)
RDW: 15.3 % (ref 11.5–15.5)
WBC: 8.3 10*3/uL (ref 4.0–10.5)
nRBC: 0 % (ref 0.0–0.2)

## 2022-08-29 LAB — MAGNESIUM: Magnesium: 1.7 mg/dL (ref 1.7–2.4)

## 2022-08-29 MED ORDER — MAGNESIUM SULFATE 2 GM/50ML IV SOLN: 2.0000 g | Freq: Once | INTRAVENOUS | Status: DC

## 2022-08-29 MED ORDER — MAGNESIUM OXIDE -MG SUPPLEMENT 400 (240 MG) MG PO TABS
800.0000 mg | ORAL_TABLET | Freq: Once | ORAL | Status: AC
  Administered 2022-08-29: 800 mg via ORAL
  Filled 2022-08-29: qty 2

## 2022-08-29 NOTE — Progress Notes (Signed)
PROGRESS NOTE    [name]  WUJ:[id number] DOB: 1990-02-09 DOA: 08/22/2022 PCP: [facility]   Brief Narrative:  The patient is a 32 y.o. F with PMH of adjustment disorder with mixed anxiety and depressed mood, autism, bipolar 1 disorder, fragile x syndrome, seizures not on meds, schizoaffective disorder and multiple suicide attempts in the last year who presented to the ED after ingesting numerous medications in a suicide attempt.  She reported taking #30 metoprolol 25mg  tablets, #60 Hydroxyzine 25mg , #7 Lexapro 20mg , #21 Gabapentin 600mg , #7 Flexeril 10mg , #7 protonix 40mg  and #7 Prazosin 1mg .   She was slightly bradycardic and hypotensive on EMS arrival which improved with fluids.  Poison control was contacted and as she was within 2hrs of ingestion was given activated charcoal.  Placed under IVC in ED.  Poison control also recommended ICU admission for close Qtc and electrolyte monitoring and possible pressors.  At the time of admission, pt was somnolent but arousable to voice and following commands. She was intubated and started on vasopressor and glucagon.  Also started on ceftriaxone for possible pneumonia.  Patient was extubated on 7/18 and transferred to the floor on 7/19. She was transferred to [facility] service on 08/25/22.   Psychiatry recommended inpatient psychiatric hospitalization, and placed APS orders. She is medically stable for D/C now and awaiting Inpatient Psychiatric Placement.   Assessment and Plan:  Suicidal attempt with intentional overdose:  -Overdose with multiple medications including #30 metoprolol 25mg  tablets, #60 Hydroxyzine 25mg , #7 Lexapro 20mg , #21 Gabapentin 600mg , #7 Flexeril 10mg , #7 protonix 40mg  and #7 Prazosin 1mg .  Poison control consulted. Received charcoal. Denies ETOH or illicit drug use.  Extended UDS negative. -Required vasopressor, glucagon, intubation and mechanical ventilation 7/17-7/18.  Room air and hemodynamically stable. -Placed  under IVC in the ED and renewed. -Continue one-to-one Recruitment consultant -Patient is medically stable pending inpatient psych bed -Psychiatry placed APS referral as well. -Further Care per Psych   Anxiety and Depression, Schizoaffective Disorder, Adjustment Disorder, bipolar disorder, fibromyalgia and auditory hallucination: -Hallucinations seems to have resolved.  Patient is on Cogentin, Zyprexa, Atarax, prazosin and Lexapro at home -Appreciate input by psych and further care per their Protocol -Continue home meds.  Lexapro changed to Cymbalta -Zyprexa as needed   CAP -No respiratory symptoms.   -CXR showed atelectasis or infiltrate in left midlung.  Pro-Cal elevated at 14.43 -WBC Trend: Recent Labs  Lab 08/22/22 1420 08/22/22 1536 08/23/22 0422 08/25/22 0726 08/26/22 0952 08/28/22 1119 08/29/22 0923  WBC 8.8 9.0 16.1* 6.4 6.8 11.6* 8.3  -Received ceftriaxone. SpO2: 96 % O2 Flow Rate (L/min): 2 L/min FiO2 (%): 40 % -Completed course with p.o. Augmentin.   HTN -Normotensive. -Resume home Lopressor -Continue monitor blood pressures per protocol -Last blood pressure reading was 120/71   Sinus Tachycardia -Resolved. -Continue home metoprolol.   Metabolic Acidosis -Improved.  Patient's CO2 is now 23, anion gap is 7, chloride level 106 -P.o. sodium bicarbonate. -Continue monitor trend and repeat CMP in a.m.   Hypomagnesemia -Patient's Mag Level Trend: Recent Labs  Lab 08/22/22 1420 08/22/22 2337 08/23/22 0422 08/25/22 0726 08/26/22 0952 08/28/22 1119 08/29/22 0923  MG 2.0 1.9 1.9 1.5* 1.7 1.8 1.7  -Replete with IV Mag Sulfate 2 Grams -Continue to Monitor and Replete as Necessary -Repeat Mag in the AM   Microcytic Anemia -H&H relatively stable.  Anemia panel with some iron deficiency with elevated TIBC and low ferritin. -Hgb/Hct Trend: Recent Labs  Lab 08/22/22 1536 08/22/22 1934 08/23/22 0422  08/25/22 0726 08/26/22 0952 08/28/22 1119 08/29/22 0923  HGB  9.4* 10.5* 9.6* 9.9* 9.2* 11.7* 10.2*  HCT 31.8* 31.0* 31.2* 31.9* 30.4* 38.4 33.0*  MCV 81.1  --  78.4* 79.4* 77.6* 80.0 77.1*  -P.o. ferrous sulfate with bowel regimen initiated at 325 mg po BID -Continue to Monitor for S/Sx of Bleeding; No overt bleeding noted  -Repeat CBC in the AM    History of Seizure Disorder -Stable.  Not on meds.   History of Asthma -Stable. -Continue home meds with albuterol 3 mL IH every 6 hours as needed for wheezing or shortness of breath   Dysuria -Patient reported dysuria this morning.  She is already was on antibiotics.  Likely due to catheterization while in ICU. -Pyridium 200 mg 3 times daily for 2 days now stopped   GERD/GI Prophylaxis -Continue PPI with Pantoprazole and Famotidine 20 mg po BID  Hypoalbuminemia -Patient's Albumin Trend: Recent Labs  Lab 08/09/22 0020 08/20/22 2040 08/22/22 1420 08/23/22 0422 08/26/22 0952 08/28/22 1119 08/29/22 0923  ALBUMIN 3.5 3.9 3.5 3.0* 3.2* 4.1 3.3*  -Continue to Monitor and Trend and repeat CMP in the AM    Obesity -Complicates overall prognosis and care -Estimated body mass index is 35.66 kg/m as calculated from the following:   Height as of this encounter: 5\' 5"  (1.651 m).   Weight as of this encounter: 97.2 kg.  -Weight Loss and Dietary Counseling given .  DVT prophylaxis: SCDs Start: 08/22/22 1514    Code Status: Full Code Family Communication: No family present at bedside  Disposition Plan:  Level of care: Med-Surg Status is: Inpatient Remains inpatient appropriate because: Medically stable to Transfer to Inpatient Psychiatric Hospitalization   Consultants:  Psychiatry PCCM Transfer  Procedures:  As delineated as above  Antimicrobials:  Anti-infectives (From admission, onward)    Start     Dose/Rate Route Frequency Ordered Stop   08/26/22 1415  amoxicillin-clavulanate (AUGMENTIN) 875-125 MG per tablet 1 tablet        1 tablet Oral Every 12 hours 08/26/22 1316 08/27/22  2205   08/23/22 1200  cefTRIAXone (ROCEPHIN) 2 g in sodium chloride 0.9 % 100 mL IVPB  Status:  Discontinued        2 g 200 mL/hr over 30 Minutes Intravenous Every 24 hours 08/23/22 0931 08/26/22 1316       Subjective: Seen and examined at bedside sitting in the chair and still feels depressed.  Denies any nausea or vomiting.  No other concerns or comments at this time.  Objective: Vitals:   08/29/22 0500 08/29/22 0512 08/29/22 0736 08/29/22 1606  BP:  118/61 (!) 125/57 120/71  Pulse:  95 100 (!) 101  Resp:  16 18   Temp:  97.9 F (36.6 C) 98.1 F (36.7 C) 98.5 F (36.9 C)  TempSrc:  Oral    SpO2:  97% 98% 96%  Weight: 97.2 kg     Height:        Intake/Output Summary (Last 24 hours) at 08/29/2022 1627 Last data filed at 08/29/2022 1609 Gross per 24 hour  Intake 700 ml  Output --  Net 700 ml   Filed Weights   08/26/22 0320 08/28/22 0500 08/29/22 0500  Weight: 101.1 kg 97.3 kg 97.2 kg   Examination: Physical Exam:  Constitutional: WN/WD obese Caucasian female in no acute distress Respiratory: Diminished to auscultation bilaterally, no wheezing, rales, rhonchi or crackles. Normal respiratory effort and patient is not tachypenic. No accessory muscle use.  Unlabored breathing Cardiovascular:  RRR, no murmurs / rubs / gallops. S1 and S2 auscultated. No extremity edema.  Abdomen: Soft, non-tender, distended secondary to body habitus. Bowel sounds positive.  GU: Deferred. Musculoskeletal: No clubbing / cyanosis of digits/nails. No joint deformity upper and lower extremities.  Skin: No rashes, lesions, ulcers on limited skin evaluation. No induration; Warm and dry.  Neurologic: CN 2-12 grossly intact with no focal deficits.  Romberg sign and cerebellar reflexes not assessed.  Psychiatric: Depressed appearing  Data Reviewed: I have personally reviewed following labs and imaging studies  CBC: Recent Labs  Lab 08/23/22 0422 08/25/22 0726 08/26/22 0952 08/28/22 1119  08/29/22 0923  WBC 16.1* 6.4 6.8 11.6* 8.3  NEUTROABS  --   --   --   --  5.0  HGB 9.6* 9.9* 9.2* 11.7* 10.2*  HCT 31.2* 31.9* 30.4* 38.4 33.0*  MCV 78.4* 79.4* 77.6* 80.0 77.1*  PLT 305 272 285 345 265   Basic Metabolic Panel: Recent Labs  Lab 08/23/22 0422 08/25/22 0726 08/26/22 0952 08/28/22 1119 08/29/22 0923  NA 132* 137 135 137 136  K 3.6 3.9 4.1 3.9 3.7  CL 105 108 105 104 106  CO2 20* 17* 17* 23 23  GLUCOSE 130* 84 88 98 111*  BUN 7 19 15 14 12   CREATININE 0.54 0.91 0.84 0.80 0.68  CALCIUM 7.9* 8.7* 8.8* 9.6 8.7*  MG 1.9 1.5* 1.7 1.8 1.7  PHOS 2.7  --  3.0 3.0 3.1   GFR: Estimated Creatinine Clearance: 115.4 mL/min (by C-G formula based on SCr of 0.68 mg/dL). Liver Function Tests: Recent Labs  Lab 08/23/22 0422 08/26/22 0952 08/28/22 1119 08/29/22 0923  AST 17  --   --  20  ALT 14  --   --  16  ALKPHOS 67  --   --  71  BILITOT 0.5  --   --  0.2*  PROT 5.9*  --   --  6.5  ALBUMIN 3.0* 3.2* 4.1 3.3*   No results for input(s): "LIPASE", "AMYLASE" in the last 168 hours. No results for input(s): "AMMONIA" in the last 168 hours. Coagulation Profile: No results for input(s): "INR", "PROTIME" in the last 168 hours. Cardiac Enzymes: No results for input(s): "CKTOTAL", "CKMB", "CKMBINDEX", "TROPONINI" in the last 168 hours. BNP (last 3 results) No results for input(s): "PROBNP" in the last 8760 hours. HbA1C: No results for input(s): "HGBA1C" in the last 72 hours. CBG: Recent Labs  Lab 08/24/22 1135 08/24/22 1610 08/24/22 2041 08/24/22 2335 08/25/22 0730  GLUCAP 107* 102* 76 119* 93   Lipid Profile: No results for input(s): "CHOL", "HDL", "LDLCALC", "TRIG", "CHOLHDL", "LDLDIRECT" in the last 72 hours. Thyroid Function Tests: No results for input(s): "TSH", "T4TOTAL", "FREET4", "T3FREE", "THYROIDAB" in the last 72 hours. Anemia Panel: No results for input(s): "VITAMINB12", "FOLATE", "FERRITIN", "TIBC", "IRON", "RETICCTPCT" in the last 72 hours. Sepsis  Labs: Recent Labs  Lab 08/25/22 1100  PROCALCITON 14.43   Recent Results (from the past 240 hour(s))  MRSA Next Gen by PCR, Nasal     Status: Abnormal   Collection Time: 08/22/22  4:24 PM   Specimen: Nasal Mucosa; Nasal Swab  Result Value Ref Range Status   MRSA by PCR Next Gen DETECTED (A) NOT DETECTED Final    Comment: RESULT CALLED TO, READ BACK BY AND VERIFIED WITH: rn s. [id number] @ 1933 fh (NOTE) The GeneXpert MRSA Assay (FDA approved for NASAL specimens only), is one component of a comprehensive MRSA colonization surveillance program. It is not  intended to diagnose MRSA infection nor to guide or monitor treatment for MRSA infections. Test performance is not FDA approved in patients less than 8 years old. Performed at [facility], [address] [place], [place] [address]   Culture, blood (Routine X 2) w Reflex to ID Panel     Status: None   Collection Time: 08/23/22  9:28 AM   Specimen: BLOOD RIGHT HAND  Result Value Ref Range Status   Specimen Description BLOOD RIGHT HAND  Final   Special Requests   Final    BOTTLES DRAWN AEROBIC AND ANAEROBIC Blood Culture adequate volume   Culture   Final    NO GROWTH 5 DAYS Performed at [facility], [address], [place], [place] [address]    Report Status 08/28/2022 FINAL  Final  Culture, blood (Routine X 2) w Reflex to ID Panel     Status: None   Collection Time: 08/23/22  9:28 AM   Specimen: BLOOD LEFT HAND  Result Value Ref Range Status   Specimen Description BLOOD LEFT HAND  Final   Special Requests   Final    BOTTLES DRAWN AEROBIC ONLY Blood Culture adequate volume   Culture   Final    NO GROWTH 5 DAYS Performed at [facility], [address] [place], [place] [address]    Report Status 08/28/2022 FINAL  Final    Radiology Studies: No results found.  Scheduled Meds:  benztropine  1 mg Oral QHS   cyclobenzaprine  10 mg Oral QHS   diclofenac Sodium  2 g Topical QID   DULoxetine  30 mg  Oral Daily   famotidine  20 mg Oral BID   ferrous sulfate  325 mg Oral BID WC   gabapentin  600 mg Oral TID   metoprolol tartrate  12.5 mg Oral BID   mometasone-formoterol  2 puff Inhalation BID   OLANZapine  10 mg Oral QHS   pantoprazole  40 mg Oral QAC breakfast   prazosin  1 mg Oral QHS   sodium bicarbonate  650 mg Oral TID   Continuous Infusions:  sodium chloride Stopped (08/24/22 1347)   magnesium sulfate bolus IVPB      LOS: 7 days   Marguerita Merles, DO [facility] Available via Epic secure chat 7am-7pm After these hours, please refer to coverage provider listed on [email or web address] 08/29/2022, 4:27 PM

## 2022-08-29 NOTE — Consult Note (Signed)
[facility] Face-to-Face Psychiatry Consult   Reason for Consult:  Status post suicide attempt by overdose Referring Physician:  [name]  Patient Identification: [name] MRN:  [id number] Principal Diagnosis: <principal problem not specified> Diagnosis:  Active Problems:   Asthma   GAD (generalized anxiety disorder)   Suicide attempt (HCC)   Bipolar I disorder, current or most recent episode depressed, with psychotic features (HCC)   Overdose   Drug overdose   TIme spent : 30 minutes   Subjective: ''32 y.o. female patient admitted with suicide attempt by overdose.  Objective:  Patient seen and evaluated face to face in her hospital room. She is a  32 y.o. female  with PMH significant for bipolar 1 disorder-depressed, fragile x syndrome, seizures, schizoaffective disorder and multiple suicide attempts. She was admitted to the hospital after she attempted suicide by overdosing on multiple medications. Today, she is alert, awake, oriented and reported taking #30 metoprolol 25mg  tablets, #60 Hydroxyzine 25mg , #7 Lexapro 20mg , #21 Gabapentin 600mg , #7 Flexeril 10mg , #7 protonix 40mg  and #7 Prazosin 1mg .     On evaluation patient states "I shouldn't have did what I did. "  She reports improvement in her psychosis 2/t olanzapine. However in terms of her depression she continues to endorse worsening depressive symptoms such as hopeless, guilt, shame, difficulty sleeping, passive si, and anhedonia. She states the possibility of losing housing, shameful of herself and missing her daughter are contributing to her worsening depressive symptoms. Patient shows some insight into her suicide attempt noting it was "eye-opening to wake up and be on life support." She states she will not ever attempt suicide again, and then makes reference to her husband who completed suicide. " When he committed suicide he was not in his right state of mind. So he went to heaven. If I do it I am going to hell  because I know what I was doing. 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 Discussion is had with patient about her suicidality intent by scratching to cause wound infection, at which time she states " Oh yea I was lonely then. Im still really depressed." She requests increase in her medication particulary her cymbalta and prazosin. Discussed hesitancy with medication adjustment as she continues to overdose on her discharge medication. She is encouraged to use grounding techniques, mindfulness, and coping skills she has used to help management her anxiety, mood, and depressive symptoms. She acknowledges that her medications are working well and we can keep them in place. "My pain has gotten better since starting gabapentin and cymbalta."  She is also tolerating his medications without adverse reactions and or side effects.  She does appear to be future oriented and seeking inpatient psychiatric services, and now appears remorseful for her suicide attempt.  She is able to verbalize her wrongdoing and understand the impact it has on others.  At this time patient continues to meet inpatient psychiatric criteria, although she denies current active suicidality she remains in the hospital under involuntary commitment for suicide attempt of high lethality by overdose on medication.   Past Psychiatric History: Previous Psych Diagnoses: Schizoaffective disorder bipolar type, Anxiety, Autism spectrum disorder, PTSD, Borderline personality disorder Prior inpatient treatment: multiple, Admitted to [facility] on 07/20/22, has been to [facility], and was discharged from inpatient facility past week. Current/prior outpatient treatment: [facility] Prior rehab hx: denies multiple  Psychotherapy hx: [facility] History of suicide: 20-30 times by overdosing on pills, cutting or drinking 409  disinfectant History of homicide or aggression: denies Psychiatric medication history:  Previously trialed Tegretol, Haldol, Clozaril, Depakote, Invega, Lamictal, Seroquel, asenapine, abilify, vraylar, Lexapro, Zoloft  Neuromodulation history: denies  Risk to Self:  Yes Risk to Others:  No Prior Inpatient Therapy:  Yes Prior Outpatient Therapy:  Yes  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    Chronic constipation    Depression    Drug-seeking behavior    Essential tremor    Headache    Ineffective individual coping 05/16/2022   Insomnia 01/12/2022   Intentional drug overdose (HCC) 06/05/2022   Neuromuscular disorder (HCC)    Normocytic anemia 06/05/2022   Overdose 07/22/2017   Overdose of acetaminophen 07/2017   and other meds   Overdose, intentional self-harm, initial encounter (HCC) 07/20/2021   Paranoia (HCC) 04/22/2021   Personality disorder (HCC)    Purposeful non-suicidal drug ingestion (HCC) 06/27/2021   Schizo-affective psychosis (HCC)    Schizoaffective disorder (HCC) 07/29/2022   Schizoaffective disorder, bipolar type (HCC)    Seizures (HCC)    Last seizure December 2017   Skin erythema 04/27/2022   Sleep apnea    Suicidal behavior 07/25/2021   Suicidal ideation    Suicide (HCC) 07/01/2021   Suicide attempt (HCC) 07/04/2021    Past Surgical History:  Procedure Laterality Date   MOUTH SURGERY  2009 or 2010   Family History:  Family History  Problem Relation Age of Onset   Mental illness Father    Asthma Father    PDD Brother    Seizures Brother    Social History:  Social History   Substance and Sexual Activity   Alcohol Use No   Alcohol/week: 1.0 standard drink of alcohol   Types: 1 Standard drinks or equivalent per week   Comment: denies at this time     Social History   Substance and Sexual Activity  Drug Use No   Comment: History of cocaine use at age 2 for 4 months    Social History   Socioeconomic History   Marital status: Widowed    Spouse name: Not on file   Number of children: 0   Years of education: Not on file   Highest education level: Not on file  Occupational History   Occupation: disability  Tobacco Use   Smoking status: Former    Types: Cigarettes   Smokeless tobacco: Never   Tobacco comments:    Smoked for 2  years age [age]  Vaping Use   Vaping status: Never Used  Substance and Sexual Activity   Alcohol use: No    Alcohol/week: 1.0 standard drink of alcohol    Types: 1 Standard drinks or equivalent per week    Comment: denies at this time   Drug use: No    Comment: History of cocaine use at age 54 for 4 months   Sexual activity: Not Currently    Birth control/protection: None  Other Topics Concern   Not on file  Social History Narrative   Marital status:  Widowed      Children: daughter      Lives: with boyfriend, in two story home      Employment:  Disability      Tobacco: quit smoking; smoked for two years.      Alcohol ;none      Drugs: none   Has not traveled outside of the country.   Right handed         Patient with hx of Fibromyalgia,Orthostatic Tachycardia,Asthma,Arthritis,Gerd, ASD   Social Determinants of Health   Financial Resource Strain: Not on file  Food Insecurity: No Food Insecurity (07/29/2022)   Hunger Vital Sign    Worried About Running Out of Food in the Last Year: Never true    Ran Out of Food in the Last Year: Never true  Transportation Needs: No Transportation Needs (07/29/2022)   PRAPARE - Administrator, Civil Service (Medical): No    Lack of Transportation (Non-Medical): No  Recent Concern: Transportation  Needs - Unmet Transportation Needs (05/06/2022)   PRAPARE - Administrator, Civil Service (Medical): Yes    Lack of Transportation (Non-Medical): Yes  Physical Activity: Not on file  Stress: Not on file  Social Connections: Not on file   Additional Social History:    Allergies:   Allergies  Allergen Reactions   Bee Venom Anaphylaxis   Coconut Flavor Anaphylaxis and Rash   Fish Allergy Anaphylaxis   Geodon [Ziprasidone Hcl] Other (See Comments)    Pt states that this medication causes paralysis of the mouth.     Haloperidol And Related Other (See Comments)    Pt states that this medication causes paralysis of the mouth, jaw locks up   Lithobid [Lithium] Other (See Comments)    Seizure-like activity    Roxicodone [Oxycodone] Other (See Comments)    Hallucinations    Seroquel [Quetiapine] Other (See Comments)    Severe drowsiness - Pt currently taking 12.5 mg BID, she is ok with this dose   Shellfish Allergy Anaphylaxis   Phenergan [Promethazine Hcl] Other (See Comments)    Chest pain     Prilosec [Omeprazole] Nausea And Vomiting and Other (See Comments)    Pt can take protonix with no problems    Sulfa Antibiotics Other (See Comments)    Chest pain    Tegretol [Carbamazepine] Nausea And Vomiting   Prozac [Fluoxetine] Other (See Comments)    Increased Depression and Suicidal thoughts   Tape Other (See Comments)    Skin tears, can only tolerate paper tape.   Tylenol [Acetaminophen] Rash and Other (See Comments)    Rash on face     Labs:  Results for orders placed or performed during the hospital encounter of 08/22/22 (from the past 48 hour(s))  Renal function panel     Status: None   Collection Time: 08/28/22 11:19 AM  Result Value Ref Range   Sodium 137 135 - 145 mmol/L   Potassium 3.9 3.5 - 5.1 mmol/L   Chloride 104 98 - 111 mmol/L   CO2 23 22 - 32 mmol/L   Glucose, Bld 98 70 - 99 mg/dL    Comment: Glucose reference range applies only to samples taken  after fasting for at least 8 hours.   BUN 14 6 - 20 mg/dL   Creatinine, Ser 1.19 0.44 - 1.00 mg/dL   Calcium 9.6 8.9 - 14.7 mg/dL   Phosphorus 3.0 2.5 - 4.6 mg/dL   Albumin 4.1 3.5 - 5.0 g/dL   GFR,  Estimated >60 >60 mL/min    Comment: (NOTE) Calculated using the CKD-EPI Creatinine Equation (2021)    Anion gap 10 5 - 15    Comment: Performed at [facility], [address], [place], [place] [address]  Magnesium     Status: None   Collection Time: 08/28/22 11:19 AM  Result Value Ref Range   Magnesium 1.8 1.7 - 2.4 mg/dL    Comment: Performed at [facility], [address] [place], [place] [address]  CBC     Status: Abnormal   Collection Time: 08/28/22 11:19 AM  Result Value Ref Range   WBC 11.6 (H) 4.0 - 10.5 K/uL   RBC 4.80 3.87 - 5.11 MIL/uL   Hemoglobin 11.7 (L) 12.0 - 15.0 g/dL   HCT 38.7 56.4 - 33.2 %   MCV 80.0 80.0 - 100.0 fL   MCH 24.4 (L) 26.0 - 34.0 pg   MCHC 30.5 30.0 - 36.0 g/dL   RDW 95.1 88.4 - 16.6 %   Platelets 345 150 - 400 K/uL   nRBC 0.0 0.0 - 0.2 %    Comment: Performed at [facility], [address], [place], [place] [address]  Comprehensive metabolic panel     Status: Abnormal   Collection Time: 08/29/22  9:23 AM  Result Value Ref Range   Sodium 136 135 - 145 mmol/L   Potassium 3.7 3.5 - 5.1 mmol/L   Chloride 106 98 - 111 mmol/L   CO2 23 22 - 32 mmol/L   Glucose, Bld 111 (H) 70 - 99 mg/dL    Comment: Glucose reference range applies only to samples taken after fasting for at least 8 hours.   BUN 12 6 - 20 mg/dL   Creatinine, Ser 6.01 0.44 - 1.00 mg/dL   Calcium 8.7 (L) 8.9 - 10.3 mg/dL   Total Protein 6.5 6.5 - 8.1 g/dL   Albumin 3.3 (L) 3.5 - 5.0 g/dL   AST 20 15 - 41 U/L   ALT 16 0 - 44 U/L   Alkaline Phosphatase 71 38 - 126 U/L   Total Bilirubin 0.2 (L) 0.3 - 1.2 mg/dL   GFR, Estimated >09 >32 mL/min    Comment: (NOTE) Calculated using the CKD-EPI Creatinine Equation (2021)    Anion gap 7 5 - 15    Comment: Performed at  [facility], [address], [place], [place] [address]  CBC with Differential/Platelet     Status: Abnormal   Collection Time: 08/29/22  9:23 AM  Result Value Ref Range   WBC 8.3 4.0 - 10.5 K/uL   RBC 4.28 3.87 - 5.11 MIL/uL   Hemoglobin 10.2 (L) 12.0 - 15.0 g/dL   HCT 22.0 (L) 25.4 - 27.0 %   MCV 77.1 (L) 80.0 - 100.0 fL   MCH 23.8 (L) 26.0 - 34.0 pg   MCHC 30.9 30.0 - 36.0 g/dL   RDW 62.3 76.2 - 83.1 %   Platelets 265 150 - 400 K/uL   nRBC 0.0 0.0 - 0.2 %   Neutrophils Relative % 59 %   Neutro Abs 5.0 1.7 - 7.7 K/uL   Lymphocytes Relative 31 %   Lymphs Abs 2.6 0.7 - 4.0 K/uL   Monocytes Relative 7 %   Monocytes Absolute 0.5 0.1 - 1.0 K/uL   Eosinophils Relative 1 %   Eosinophils Absolute 0.1 0.0 - 0.5 K/uL   Basophils Relative 1 %   Basophils Absolute 0.1 0.0 - 0.1 K/uL  Immature Granulocytes 1 %   Abs Immature Granulocytes 0.07 0.00 - 0.07 K/uL    Comment: Performed at [facility], [address] [place], [place] [address]  Magnesium     Status: None   Collection Time: 08/29/22  9:23 AM  Result Value Ref Range   Magnesium 1.7 1.7 - 2.4 mg/dL    Comment: Performed at [facility], [address], [place], [place] [address]  Phosphorus     Status: None   Collection Time: 08/29/22  9:23 AM  Result Value Ref Range   Phosphorus 3.1 2.5 - 4.6 mg/dL    Comment: Performed at [facility], [address], [place], [place] [address]    Current Facility-Administered Medications  Medication Dose Route Frequency Provider Last Rate Last Admin   0.9 %  sodium chloride infusion  250 mL Intravenous Continuous Candelaria Stagers T, MD   Stopped at 08/24/22 1347   albuterol (PROVENTIL) (2.5 MG/3ML) 0.083% nebulizer solution 3 mL  3 mL Inhalation Q6H PRN Candelaria Stagers T, MD       benztropine (COGENTIN) tablet 1 mg  1 mg Oral QHS Candelaria Stagers T, MD   1 mg at 08/28/22 2135   cyclobenzaprine (FLEXERIL) tablet 10 mg  10 mg Oral QHS Candelaria Stagers T, MD   10 mg at 08/28/22 2135    diclofenac Sodium (VOLTAREN) 1 % topical gel 2 g  2 g Topical QID Candelaria Stagers T, MD   2 g at 08/29/22 1346   DULoxetine (CYMBALTA) DR capsule 30 mg  30 mg Oral Daily Candelaria Stagers T, MD   30 mg at 08/29/22 0824   famotidine (PEPCID) tablet 20 mg  20 mg Oral BID Candelaria Stagers T, MD   20 mg at 08/29/22 0824   ferrous sulfate tablet 325 mg  325 mg Oral BID WC Candelaria Stagers T, MD   325 mg at 08/29/22 0824   gabapentin (NEURONTIN) capsule 600 mg  600 mg Oral TID Candelaria Stagers T, MD   600 mg at 08/29/22 9147   hydrOXYzine (ATARAX) tablet 25 mg  25 mg Oral TID PRN Candelaria Stagers T, MD   25 mg at 08/29/22 1118   ibuprofen (ADVIL) tablet 600 mg  600 mg Oral TID PRN Candelaria Stagers T, MD   600 mg at 08/27/22 1622   LORazepam (ATIVAN) tablet 0.5 mg  0.5 mg Oral BID PRN Candelaria Stagers T, MD   0.5 mg at 08/29/22 1118   metoprolol tartrate (LOPRESSOR) tablet 12.5 mg  12.5 mg Oral BID Candelaria Stagers T, MD   12.5 mg at 08/29/22 0824   mometasone-formoterol (DULERA) 200-5 MCG/ACT inhaler 2 puff  2 puff Inhalation BID Candelaria Stagers T, MD   2 puff at 08/29/22 0827   OLANZapine (ZYPREXA) injection 2.5 mg  2.5 mg Intramuscular Once PRN Almon Hercules, MD       OLANZapine (ZYPREXA) tablet 10 mg  10 mg Oral QHS Candelaria Stagers T, MD   10 mg at 08/28/22 2134   Oral care mouth rinse  15 mL Mouth Rinse PRN Gonfa, Taye T, MD       pantoprazole (PROTONIX) EC tablet 40 mg  40 mg Oral QAC breakfast Candelaria Stagers T, MD   40 mg at 08/29/22 0823   polyethylene glycol (MIRALAX / GLYCOLAX) packet 17 g  17 g Oral BID PRN Candelaria Stagers T, MD   17 g at 08/29/22 0823   prazosin (MINIPRESS) capsule 1 mg  1 mg Oral QHS Candelaria Stagers T, MD   1 mg at 08/28/22 2135   senna-docusate (Senokot-S) tablet 1 tablet  1 tablet Oral BID PRN Almon Hercules, MD   1 tablet at 08/29/22 2952   sodium bicarbonate tablet 650 mg  650 mg Oral TID Candelaria Stagers T, MD   650 mg at 08/29/22 8413   traZODone (DESYREL) tablet 50 mg  50 mg Oral QHS PRN Almon Hercules, MD   50 mg at 08/28/22 2255     Musculoskeletal: Strength & Muscle Tone: within normal limits Gait & Station: normal Patient leans: N/A      Psychiatric Specialty Exam:  Presentation  General Appearance:  Appropriate for Environment; Casual  Eye Contact: Fair  Speech: Clear and Coherent; Normal Rate  Speech Volume: Normal  Handedness: Right   Mood and Affect  Mood: Anxious; Depressed; Dysphoric  Affect: Congruent   Thought Process  Thought Processes: Coherent; Linear  Descriptions of Associations:Tangential  Orientation:Full (Time, Place and Person)  Thought Content:Rumination  History of Schizophrenia/Schizoaffective disorder:Yes  Duration of Psychotic Symptoms:Greater than six months  Hallucinations:Hallucinations: Auditory Description of Auditory Hallucinations: whispers " tells me Im worthless."   Ideas of Reference:None  Suicidal Thoughts:yes  Homicidal Thoughts:denies   Sensorium  Memory: Immediate Fair; Recent Fair; Remote Fair  Judgment: Fair  Insight: Poor   Executive Functions  Concentration: Fair  Attention Span: Fair  Recall: Fair  Fund of Knowledge: Good  Language: Good   Psychomotor Activity  Psychomotor Activity: retardation   Assets  Assets: Communication Skills; Desire for Improvement; Financial Resources/Insurance; Resilience   Sleep  Sleep: fair   Physical Exam: Physical Exam Constitutional:      Appearance: Normal appearance.  HENT:     Head: Normocephalic and atraumatic.     Nose: Nose normal.  Eyes:     Pupils: Pupils are equal, round, and reactive to light.  Pulmonary:     Effort: Pulmonary effort is normal.  Skin:    General: Skin is warm and dry.  Neurological:     General: No focal deficit present.     Mental Status: She is alert and oriented to person, place, and time.  Psychiatric:        Attention and Perception: Attention normal. She perceives auditory hallucinations.        Mood and Affect:  Mood is anxious. Affect is blunt.        Speech: Speech normal.        Behavior: Behavior normal. Behavior is cooperative.        Thought Content: Thought content includes suicidal ideation.        Cognition and Memory: Cognition and memory normal.        Judgment: Judgment is impulsive.    Review of Systems  Constitutional:  Positive for malaise/fatigue.  HENT: Negative.    Eyes: Negative.   Respiratory: Negative.    Cardiovascular: Negative.   Gastrointestinal: Negative.   Neurological: Negative.   Psychiatric/Behavioral:  Positive for depression. Negative for suicidal ideas. The patient is nervous/anxious and has insomnia.   All other systems reviewed and are negative.  Blood pressure (!) 125/57, pulse 100, temperature 98.1 F (36.7 C), resp. rate 18, height 5\' 5"  (1.651 m), weight 97.2 kg, last menstrual period 08/08/2022, SpO2 98%. Body mass index is 35.66 kg/m.  Treatment Plan Summary: Daily contact with patient to assess and evaluate symptoms and progress in treatment and Medication management Plan/Recommendations: -Continue 1:1 sitter for safety -Continue current medications.  Continue olanzapine 10mg  po daily for psychosis.  -Consider TOC/Social worker consult to facilitate psych inpatient admission after patient is medically stable.  Consider [facility] referral.  --[facility] referral for self neglect/abuse, inability to keep self safe in the community.   Disposition: Recommend psychiatric Inpatient admission when medically cleared. Supportive therapy provided about ongoing stressors.  Maryagnes Amos, FNP

## 2022-08-29 NOTE — Hospital Course (Addendum)
32 y.o. female with PMH of adjustment disorder with mixed anxiety and depressed mood, autism, bipolar 1 disorder, fragile x syndrome, seizures not on meds, schizoaffective disorder and multiple suicide attempts in the last year  presented to the ED after ingesting numerous medications in a suicide attempt. She reported taking #30 metoprolol 25mg  tablets, #60 Hydroxyzine 25mg , #7 Lexapro 20mg , #21 Gabapentin 600mg , #7 Flexeril 10mg , #7 protonix 40mg  and #7 Prazosin 1mg . She was slightly bradycardic and hypotensive on EMS arrival which improved with fluids. Poison control was contacted and as she was within 2hrs of ingestion was given activated charcoal.  Patient was placed under IVC in ED. Poison control also recommended ICU admission for close Qtc and electrolyte monitoring and possible pressors.  She was intubated and started on vasopressor and glucagon. Also started on ceftriaxone for possible pneumonia. Patient was extubated on 7/18 and transferred to the floor on 7/19. She was transferred to [facility] service on 08/25/22. Psychiatry continues to follow. They recommend inpatient psychiatric hospitalization.   Assessment and Plan:  Suicidal Attempt with intentional overdose:  -Multiple medications including #30 metoprolol 25mg  tablets, #60 Hydroxyzine 25mg , #7 Lexapro 20mg , #21 Gabapentin 600mg , #7 Flexeril 10mg , #7 protonix 40mg  and #7 Prazosin 1mg .  Poison control for the patient generalized pain.  Received charcoal.  Initially required vasopressor glucagon intubation and mechanical ventilation.  Currently on IVC. Psychiatry placed APS referral as well.  Further care as per psychiatry.  On restraints and IM Zyprexa intermittently.  Cymbalta dose has been increased by psychiatry.   Anxiety and Depression, Schizoaffective Disorder, Adjustment Disorder, bipolar disorder, fibromyalgia and auditory hallucination:  Improved hallucination. On Cogentin, Zyprexa, Atarax, prazosin and Lexapro at home.  Lexapro changed to  Cymbalta. Zyprexa as needed and psychiatry recommending olanzapine 10 mg IM daily single dose as needed.  Intermittently has to be placed in 4-point restraints and given IM Zyprexa.  Continue one-to-one sitter at bedside.  Continue gabapentin 600 mg 3 times daily for agitated mood/anxiety  ?? -Continues to Endorse Suicidal Thoughts and Ideation   CAP Had significantly elevated procalcitonin.  Has completed course of p.o. debrided.  Initially with Rocephin followed by Augmentin.  HTN Continue Lopressor.   Sinus Tachycardia Continue Lopressor.  Improved.  Metabolic Acidosis Improved.  Recent labs.  Check BMP.   Hypomagnesemia Improved.  Microcytic Anemia Iron deficiency anemia.  Low ferritin.  Continue oral iron.  History of Seizure Disorder -Stable.  Not on meds.   History of Asthma Continue nebulizers.  Stable.   Dysuria Was on catheter during ICU.  On Pyridium.  Was recently treated with antibiotic.   Hypoalbuminemia Encourage oral intake.  Obesity Body mass index is 36.03 kg/m.  Would benefit from weight loss as outpatient.

## 2022-08-29 NOTE — Plan of Care (Signed)
Problem: Education: Goal: Knowledge of General Education information will improve Description: Including pain rating scale, medication(s)/side effects and non-pharmacologic comfort measures Outcome: Progressing Pt understands she was admitted into the hospital for OD/ SI attempt.  She is aware she is awaiting a bed at [facility] for inpatient Psych bed.  Until then she will be monitored 1:1 d/t SI attempt/ IVC.  VS have been WNL thus far.    Problem: Clinical Measurements: Goal: Ability to maintain clinical measurements within normal limits will improve Outcome: Progressing VS have been WNL thus far.  Pt's albumin and Ca+ are on the low end today along with her H/H.  MD is aware.    Problem: Clinical Measurements: Goal: Will remain free from infection Outcome: Progressing S/Sx of infection monitored and assessed q4 hours.  Pt has remained afebrile thus far.  She currently is on contact precaution for MRSA infection per MD's orders.     Problem: Clinical Measurements: Goal: Cardiovascular complication will be avoided Outcome: Progressing VS have been WNL thus far.  Pt's albumin and Ca+ are on the low end today along with her H/H.  MD is aware.  Pt has not endorsed c/o CP. Chest tightness or palpitations thus far.    Problem: Activity: Goal: Risk for activity intolerance will decrease Outcome: Progressing Pt is independent of all her ADLs. She was observed OOB ambulating in her room with a steady gait.  Pt understands she was admitted into the hospital for OD/ SI attempt.  She is aware that she will be monitored 1:1 d/t SI attempt/ IVC.    Problem: Coping: Goal: Level of anxiety will decrease Outcome: Progressing Pt has expressed anxiety r/t her hospitalization and life in general.  Will continue to offer presence and support to pt as needed.   Problem: Elimination: Goal: Will not experience complications related to bowel motility Outcome: Progressing Pt stated her LBM  was on 08/29/2022.  She does not endorse c/o constipation or abdominal pain/ distention.    Problem: Pain Managment: Goal: General experience of comfort will improve Outcome: Progressing Pt has denied pain thus far.    Problem: Safety: Goal: Ability to remain free from injury will improve Outcome: Progressing Pt has remained free from falls thus far. Instructed pt to utilize RN call light for assistance. Hourly rounds performed. Bed in lowest position, locked with two upper side rails engaged. Belongings and call light within reach.  Pt understands she was admitted into the hospital for OD/ SI attempt.  She is aware she is awaiting a bed at [facility] for inpatient Psych bed.  Until then she will be monitored 1:1 d/t SI attempt.  Even though she has a 1:1 sitter pt still inflicts self harm to herself.  MD and all other staff are aware.    Problem: Nutritional: Goal: Maintenance of adequate nutrition will improve Outcome: Progressing Pt is on a regular diet per MD's orders.  She has been able to tolerate her diet thus far w/o s/sx of abdominal pain/ distention or n/v.             Problem: Skin Integrity: Goal: Risk for impaired skin integrity will decrease Outcome: Progressing Skin integrity monitored and assessed q-shift. Instructed pt to turn q2 hours to prevent further skin impairment. Tubes and drains assessed for device related pressure sores. Pt is continent of both her bowel and bladder.  Even though pt has a 1:1 sitter she still inflicts self harm to herself.  MD and all other  staff are aware.

## 2022-08-29 NOTE — Plan of Care (Signed)
  Problem: Education: Goal: Knowledge of General Education information will improve Description: Including pain rating scale, medication(s)/side effects and non-pharmacologic comfort measures Outcome: Progressing   Problem: Health Behavior/Discharge Planning: Goal: Ability to manage health-related needs will improve Outcome: Progressing   Problem: Clinical Measurements: Goal: Ability to maintain clinical measurements within normal limits will improve Outcome: Progressing Goal: Will remain free from infection Outcome: Progressing Goal: Diagnostic test results will improve Outcome: Progressing Goal: Cardiovascular complication will be avoided Outcome: Progressing   Problem: Activity: Goal: Risk for activity intolerance will decrease Outcome: Progressing   Problem: Coping: Goal: Level of anxiety will decrease Outcome: Progressing   Problem: Elimination: Goal: Will not experience complications related to bowel motility Outcome: Progressing   Problem: Pain Managment: Goal: General experience of comfort will improve Outcome: Progressing   Problem: Safety: Goal: Ability to remain free from injury will improve Outcome: Progressing   Problem: Skin Integrity: Goal: Risk for impaired skin integrity will decrease Outcome: Progressing   Problem: Role Relationship: Goal: Method of communication will improve Outcome: Progressing   Problem: Coping: Goal: Ability to adjust to condition or change in health will improve Outcome: Progressing   Problem: Fluid Volume: Goal: Ability to maintain a balanced intake and output will improve Outcome: Progressing   Problem: Health Behavior/Discharge Planning: Goal: Ability to identify and utilize available resources and services will improve Outcome: Progressing Goal: Ability to manage health-related needs will improve Outcome: Progressing   Problem: Metabolic: Goal: Ability to maintain appropriate glucose levels will  improve Outcome: Progressing   Problem: Nutritional: Goal: Maintenance of adequate nutrition will improve Outcome: Progressing Goal: Progress toward achieving an optimal weight will improve Outcome: Progressing   Problem: Skin Integrity: Goal: Risk for impaired skin integrity will decrease Outcome: Progressing   Problem: Tissue Perfusion: Goal: Adequacy of tissue perfusion will improve Outcome: Progressing

## 2022-08-30 DIAGNOSIS — J4520 Mild intermittent asthma, uncomplicated: Secondary | ICD-10-CM | POA: Diagnosis not present

## 2022-08-30 DIAGNOSIS — F315 Bipolar disorder, current episode depressed, severe, with psychotic features: Secondary | ICD-10-CM | POA: Diagnosis not present

## 2022-08-30 DIAGNOSIS — T50902A Poisoning by unspecified drugs, medicaments and biological substances, intentional self-harm, initial encounter: Secondary | ICD-10-CM | POA: Diagnosis not present

## 2022-08-30 DIAGNOSIS — T50992A Poisoning by other drugs, medicaments and biological substances, intentional self-harm, initial encounter: Secondary | ICD-10-CM

## 2022-08-30 DIAGNOSIS — T1491XA Suicide attempt, initial encounter: Secondary | ICD-10-CM | POA: Diagnosis not present

## 2022-08-30 DIAGNOSIS — F411 Generalized anxiety disorder: Secondary | ICD-10-CM | POA: Diagnosis not present

## 2022-08-30 MED ORDER — OLANZAPINE 10 MG IM SOLR
10.0000 mg | Freq: Once | INTRAMUSCULAR | Status: AC | PRN
  Administered 2022-08-30: 10 mg via INTRAMUSCULAR
  Filled 2022-08-30: qty 10

## 2022-08-30 MED ORDER — MUPIROCIN CALCIUM 2 % EX CREA
TOPICAL_CREAM | Freq: Every day | CUTANEOUS | Status: DC
  Filled 2022-08-30 (×3): qty 15

## 2022-08-30 MED ORDER — DIPHENHYDRAMINE HCL 50 MG/ML IJ SOLN: 50.0000 mg | Freq: Once | INTRAMUSCULAR | Status: DC

## 2022-08-30 MED ORDER — ONDANSETRON 4 MG PO TBDP
4.0000 mg | ORAL_TABLET | Freq: Three times a day (TID) | ORAL | Status: DC | PRN
  Administered 2022-08-30 – 2022-10-14 (×9): 4 mg via ORAL
  Filled 2022-08-30 (×12): qty 1

## 2022-08-30 NOTE — Consult Note (Addendum)
WOC Nurse Consult Note: Reason for Consult: Consult requested for left arm wounds of unknown etiology.  Pt believes they occurred in the hospital from an IV infiltrate, but I could not find documentation for of this occurrence.  Wound type: Left posterior arm near wrist with partial thickness wound, .5X.5X.1cm, red, dry and scabbed Left anterior forearm with full thickness wound, red and dry and scabbed, 5X1X.2cm  Pressure Injury POA: Yes/No/NA Dressing procedure/placement/frequency: Topical treatment orders provided for bedside nurses to perform as follows to promote moist healing: Apply Bactroban to left anterior and posterior arm wounds Q day, then cover with foam dressings. Change foam dressings Q 3 days or PRN soiling. Please re-consult if further assistance is needed.  Thank-you,  Cammie Mcgee MSN, RN, CWOCN, [place], CNS [phone]

## 2022-08-30 NOTE — Progress Notes (Signed)
Pt approached the nurses station stating she was going to kill someone that the voices in her head were too much for her to handle. Security called to bedside. Pt placed in 4 point restraints and given IM Zyprexa. Psych and primary team notified. 1:1 sitter at bedside

## 2022-08-30 NOTE — Progress Notes (Signed)
Patient arrives to unit in 4 point restraints with next 2 hour check due at 1800.  Patient alert and oriented with no complaints or requests at this time.  Belongings placed in front conference room on 5N

## 2022-08-30 NOTE — Progress Notes (Signed)
Patient to 5N23 at this time

## 2022-08-30 NOTE — TOC Progression Note (Signed)
Transition of Care [facility]) - Progression Note    Patient Details  Name: [name] MRN: [id number] Date of Birth: 08-Jan-1991  Transition of Care [facility]) CM/SW Contact  Alizia Greif A [place], [place] Phone Number: 08/30/2022, 11:30 AM  Clinical Narrative:      CSW contacted [facility], [phone] on updated for pt's referral. Staff member stated pt is on the waiting list and does not have a timeline of when pt could be accepted, but depends on their "discharges" and "priority"  CSW will follow up with treatment team regarding admission.   TOC will continue to follow.       Expected Discharge Plan and Services                                               Social Determinants of Health (SDOH) Interventions SDOH Screenings   Food Insecurity: No Food Insecurity (07/29/2022)  Housing: Low Risk  (07/29/2022)  Transportation Needs: No Transportation Needs (07/29/2022)  Recent Concern: Transportation Needs - Unmet Transportation Needs (05/06/2022)  Utilities: Not At Risk (07/29/2022)  Alcohol Screen: Low Risk  (07/29/2022)  Depression (PHQ2-9): Medium Risk (03/10/2022)  Tobacco Use: Medium Risk (08/22/2022)    Readmission Risk Interventions    06/06/2022    3:38 PM  Readmission Risk Prevention Plan  Transportation Screening Complete  Medication Review (RN Care Manager) Complete  PCP or Specialist appointment within 3-5 days of discharge Complete  HRI or Home Care Consult Complete  SW Recovery Care/Counseling Consult Complete  Palliative Care Screening Not Applicable  Skilled Nursing Facility Not Applicable

## 2022-08-30 NOTE — Consult Note (Signed)
[facility] Face-to-Face Psychiatry Consult   Reason for Consult:  Status post suicide attempt by overdose Referring Physician:  [name]  Patient Identification: [name] MRN:  [id number] Principal Diagnosis: <principal problem not specified> Diagnosis:  Active Problems:   Asthma   GAD (generalized anxiety disorder)   Suicide attempt (HCC)   Bipolar I disorder, current or most recent episode depressed, with psychotic features (HCC)   Overdose   Drug overdose   TIme spent : 30 minutes   Subjective: ''31 y.o. female patient admitted with suicide attempt by overdose.  Objective:  Patient seen and evaluated face to face in her hospital room. She is a  32 y.o. female  with PMH significant for bipolar 1 disorder-depressed, fragile x syndrome, seizures, schizoaffective disorder and multiple suicide attempts. She was admitted to the hospital after she attempted suicide by overdosing on multiple medications. Today, she is alert, awake, oriented and reported taking #30 metoprolol 25mg  tablets, #60 Hydroxyzine 25mg , #7 Lexapro 20mg , #21 Gabapentin 600mg , #7 Flexeril 10mg , #7 protonix 40mg  and #7 Prazosin 1mg .     On evaluation patient states "I didn't do anything. I went to the desk and said I was homicidal and suicidal. I said I wanted to kill my parents and then I got tied down. I thought we could talk and tell people how me feel, and this is what happened to me. " She is observed to be lying in bed in (3) point restraints. She is calm and cooperative asking when can she get out of restraints. Writer attempted to process today's actions with patient in which she declined stating " I don't want to talk to about, people are going to think I am delusional. I dont have anything to do in here and I am ready to go." The patient exhibits manipulative behaviors and has a tendency to exploit vulnerabilities in others. We reviewed referrals and current plans outside of inpatient psychiatric  hospitalization.  She continues to endorse hallucinations however she is vague in her responses. She has received (1) prn injection today as she was threatening, agitated, aggressive and throwing things at staff. She minimizes her actions at this time and states this is no fault of her own, to include being placed in (4) restraints. She denies any EPS symptoms, rigidity, stiffness, and jaw/neck pain. She is also tolerating medications without adverse reactions and or side effects.  She nods her head in agreement with the plan. At this time patient continues to meet inpatient psychiatric criteria, she endorses active suicidality and will remain in the hospital under involuntary commitment for suicide attempt of high lethality by overdose on medication.   Past Psychiatric History: Previous Psych Diagnoses: Schizoaffective disorder bipolar type, Anxiety, Autism spectrum disorder, PTSD, Borderline personality disorder Prior inpatient treatment: multiple, Admitted to [facility] on 07/20/22, has been to [facility], and was discharged from inpatient facility past week. Current/prior outpatient treatment: [facility] Prior rehab hx: denies multiple Psychotherapy hx: [facility] History of suicide: 20-30 times by overdosing on pills, cutting or drinking 409 disinfectant History of homicide or aggression: denies Psychiatric medication history:  Previously trialed Tegretol, Haldol, Clozaril, Depakote, Invega, Lamictal, Seroquel, asenapine, abilify, vraylar, Lexapro, Zoloft  Neuromodulation history: denies  Risk to Self:  Yes Risk to Others:  No Prior Inpatient Therapy:  Yes Prior Outpatient Therapy:  Yes  Past Medical History:  Past Medical History:  Diagnosis Date   Acid reflux    Adjustment disorder with mixed anxiety and depressed mood 01/31/2022  Adjustment disorder with mixed disturbance of emotions and conduct 08/03/2019   Anxiety    Asthma    last attack 03/13/15 or 03/14/15   Autism    Bipolar  1 disorder, depressed, severe (HCC) 07/25/2021   Carrier of fragile X syndrome    Chronic constipation    Depression    Drug-seeking behavior    Essential tremor    Headache    Ineffective individual coping 05/16/2022   Insomnia 01/12/2022   Intentional drug overdose (HCC) 06/05/2022   Neuromuscular disorder (HCC)    Normocytic anemia 06/05/2022   Overdose 07/22/2017   Overdose of acetaminophen 07/2017   and other meds   Overdose, intentional self-harm, initial encounter (HCC) 07/20/2021   Paranoia (HCC) 04/22/2021   Personality disorder (HCC)    Purposeful non-suicidal drug ingestion (HCC) 06/27/2021   Schizo-affective psychosis (HCC)    Schizoaffective disorder (HCC) 07/29/2022   Schizoaffective disorder, bipolar type (HCC)    Seizures (HCC)    Last seizure December 2017   Skin erythema 04/27/2022   Sleep apnea    Suicidal behavior 07/25/2021   Suicidal ideation    Suicide (HCC) 07/01/2021   Suicide attempt (HCC) 07/04/2021    Past Surgical History:  Procedure Laterality Date   MOUTH SURGERY  2009 or 2010   Family History:  Family History  Problem Relation Age of Onset   Mental illness Father    Asthma Father    PDD Brother    Seizures Brother    Social History:  Social History   Substance and Sexual Activity  Alcohol Use No   Alcohol/week: 1.0 standard drink of alcohol   Types: 1 Standard drinks or equivalent per week   Comment: denies at this time     Social History   Substance and Sexual Activity  Drug Use No   Comment: History of cocaine use at age 29 for 4 months    Social History   Socioeconomic History   Marital status: Widowed    Spouse name: Not on file   Number of children: 0   Years of education: Not on file   Highest education level: Not on file  Occupational History   Occupation: disability  Tobacco Use   Smoking status: Former    Types: Cigarettes   Smokeless tobacco: Never   Tobacco comments:    Smoked for 2  years age [age]   Vaping Use   Vaping status: Never Used  Substance and Sexual Activity   Alcohol use: No    Alcohol/week: 1.0 standard drink of alcohol    Types: 1 Standard drinks or equivalent per week    Comment: denies at this time   Drug use: No    Comment: History of cocaine use at age 72 for 4 months   Sexual activity: Not Currently    Birth control/protection: None  Other Topics Concern   Not on file  Social History Narrative   Marital status: Widowed      Children: daughter      Lives: with boyfriend, in two story home      Employment:  Disability      Tobacco: quit smoking; smoked for two years.      Alcohol ;none      Drugs: none   Has not traveled outside of the country.   Right handed         Patient with hx of Fibromyalgia,Orthostatic Tachycardia,Asthma,Arthritis,Gerd, ASD   Social Determinants of Health   Financial Resource Strain: Not on  file  Food Insecurity: No Food Insecurity (07/29/2022)   Hunger Vital Sign    Worried About Running Out of Food in the Last Year: Never true    Ran Out of Food in the Last Year: Never true  Transportation Needs: No Transportation Needs (07/29/2022)   PRAPARE - Administrator, Civil Service (Medical): No    Lack of Transportation (Non-Medical): No  Recent Concern: Transportation Needs - Unmet Transportation Needs (05/06/2022)   PRAPARE - Administrator, Civil Service (Medical): Yes    Lack of Transportation (Non-Medical): Yes  Physical Activity: Not on file  Stress: Not on file  Social Connections: Not on file   Additional Social History:    Allergies:   Allergies  Allergen Reactions   Bee Venom Anaphylaxis   Coconut Flavor Anaphylaxis and Rash   Fish Allergy Anaphylaxis   Geodon [Ziprasidone Hcl] Other (See Comments)    Pt states that this medication causes paralysis of the mouth.     Haloperidol And Related Other (See Comments)    Pt states that this medication causes paralysis of the mouth, jaw locks up    Lithobid [Lithium] Other (See Comments)    Seizure-like activity    Roxicodone [Oxycodone] Other (See Comments)    Hallucinations    Seroquel [Quetiapine] Other (See Comments)    Severe drowsiness - Pt currently taking 12.5 mg BID, she is ok with this dose   Shellfish Allergy Anaphylaxis   Phenergan [Promethazine Hcl] Other (See Comments)    Chest pain     Prilosec [Omeprazole] Nausea And Vomiting and Other (See Comments)    Pt can take protonix with no problems    Sulfa Antibiotics Other (See Comments)    Chest pain    Tegretol [Carbamazepine] Nausea And Vomiting   Prozac [Fluoxetine] Other (See Comments)    Increased Depression and Suicidal thoughts   Tape Other (See Comments)    Skin tears, can only tolerate paper tape.   Tylenol [Acetaminophen] Rash and Other (See Comments)    Rash on face     Labs:  Results for orders placed or performed during the hospital encounter of 08/22/22 (from the past 48 hour(s))  Comprehensive metabolic panel     Status: Abnormal   Collection Time: 08/29/22  9:23 AM  Result Value Ref Range   Sodium 136 135 - 145 mmol/L   Potassium 3.7 3.5 - 5.1 mmol/L   Chloride 106 98 - 111 mmol/L   CO2 23 22 - 32 mmol/L   Glucose, Bld 111 (H) 70 - 99 mg/dL    Comment: Glucose reference range applies only to samples taken after fasting for at least 8 hours.   BUN 12 6 - 20 mg/dL   Creatinine, Ser 2.95 0.44 - 1.00 mg/dL   Calcium 8.7 (L) 8.9 - 10.3 mg/dL   Total Protein 6.5 6.5 - 8.1 g/dL   Albumin 3.3 (L) 3.5 - 5.0 g/dL   AST 20 15 - 41 U/L   ALT 16 0 - 44 U/L   Alkaline Phosphatase 71 38 - 126 U/L   Total Bilirubin 0.2 (L) 0.3 - 1.2 mg/dL   GFR, Estimated >62 >13 mL/min    Comment: (NOTE) Calculated using the CKD-EPI Creatinine Equation (2021)    Anion gap 7 5 - 15    Comment: Performed at [facility], [address] [place], [place] [address]  CBC with Differential/Platelet     Status: Abnormal   Collection Time:  08/29/22  9:23 AM  Result  Value Ref Range   WBC 8.3 4.0 - 10.5 K/uL   RBC 4.28 3.87 - 5.11 MIL/uL   Hemoglobin 10.2 (L) 12.0 - 15.0 g/dL   HCT 16.1 (L) 09.6 - 04.5 %   MCV 77.1 (L) 80.0 - 100.0 fL   MCH 23.8 (L) 26.0 - 34.0 pg   MCHC 30.9 30.0 - 36.0 g/dL   RDW 40.9 81.1 - 91.4 %   Platelets 265 150 - 400 K/uL   nRBC 0.0 0.0 - 0.2 %   Neutrophils Relative % 59 %   Neutro Abs 5.0 1.7 - 7.7 K/uL   Lymphocytes Relative 31 %   Lymphs Abs 2.6 0.7 - 4.0 K/uL   Monocytes Relative 7 %   Monocytes Absolute 0.5 0.1 - 1.0 K/uL   Eosinophils Relative 1 %   Eosinophils Absolute 0.1 0.0 - 0.5 K/uL   Basophils Relative 1 %   Basophils Absolute 0.1 0.0 - 0.1 K/uL   Immature Granulocytes 1 %   Abs Immature Granulocytes 0.07 0.00 - 0.07 K/uL    Comment: Performed at [facility], [address], [place], [place] [address]  Magnesium     Status: None   Collection Time: 08/29/22  9:23 AM  Result Value Ref Range   Magnesium 1.7 1.7 - 2.4 mg/dL    Comment: Performed at [facility], [address], [place], [place] [address]  Phosphorus     Status: None   Collection Time: 08/29/22  9:23 AM  Result Value Ref Range   Phosphorus 3.1 2.5 - 4.6 mg/dL    Comment: Performed at [facility], [address], [place], [place] [address]    Current Facility-Administered Medications  Medication Dose Route Frequency Provider Last Rate Last Admin   0.9 %  sodium chloride infusion  250 mL Intravenous Continuous Candelaria Stagers T, MD   Stopped at 08/24/22 1347   albuterol (PROVENTIL) (2.5 MG/3ML) 0.083% nebulizer solution 3 mL  3 mL Inhalation Q6H PRN Candelaria Stagers T, MD       benztropine (COGENTIN) tablet 1 mg  1 mg Oral QHS Candelaria Stagers T, MD   1 mg at 08/29/22 2221   cyclobenzaprine (FLEXERIL) tablet 10 mg  10 mg Oral QHS Candelaria Stagers T, MD   10 mg at 08/29/22 2220   diclofenac Sodium (VOLTAREN) 1 % topical gel 2 g  2 g Topical QID Candelaria Stagers T, MD   2 g at 08/30/22 1326   diphenhydrAMINE (BENADRYL) injection 50 mg  50 mg  Intravenous Once Starkes-Perry, Juel Burrow, FNP       DULoxetine (CYMBALTA) DR capsule 30 mg  30 mg Oral Daily Candelaria Stagers T, MD   30 mg at 08/30/22 0841   famotidine (PEPCID) tablet 20 mg  20 mg Oral BID Candelaria Stagers T, MD   20 mg at 08/30/22 0841   ferrous sulfate tablet 325 mg  325 mg Oral BID WC Candelaria Stagers T, MD   325 mg at 08/30/22 1734   gabapentin (NEURONTIN) capsule 600 mg  600 mg Oral TID Candelaria Stagers T, MD   600 mg at 08/30/22 1734   hydrOXYzine (ATARAX) tablet 25 mg  25 mg Oral TID PRN Candelaria Stagers T, MD   25 mg at 08/29/22 1118   ibuprofen (ADVIL) tablet 600 mg  600 mg Oral TID PRN Candelaria Stagers T, MD   600 mg at 08/30/22 1349   LORazepam (ATIVAN) tablet  0.5 mg  0.5 mg Oral BID PRN Candelaria Stagers T, MD   0.5 mg at 08/30/22 1154   metoprolol tartrate (LOPRESSOR) tablet 12.5 mg  12.5 mg Oral BID Candelaria Stagers T, MD   12.5 mg at 08/30/22 0841   mometasone-formoterol (DULERA) 200-5 MCG/ACT inhaler 2 puff  2 puff Inhalation BID Almon Hercules, MD   2 puff at 08/29/22 0827   mupirocin cream (BACTROBAN) 2 %   Topical Daily Marguerita Merles Marland, DO   Given at 08/30/22 1326   OLANZapine (ZYPREXA) tablet 10 mg  10 mg Oral QHS Candelaria Stagers T, MD   10 mg at 08/29/22 2221   Oral care mouth rinse  15 mL Mouth Rinse PRN Gonfa, Taye T, MD       pantoprazole (PROTONIX) EC tablet 40 mg  40 mg Oral QAC breakfast Candelaria Stagers T, MD   40 mg at 08/30/22 0841   polyethylene glycol (MIRALAX / GLYCOLAX) packet 17 g  17 g Oral BID PRN Candelaria Stagers T, MD   17 g at 08/30/22 1349   prazosin (MINIPRESS) capsule 1 mg  1 mg Oral QHS Candelaria Stagers T, MD   1 mg at 08/29/22 2221   senna-docusate (Senokot-S) tablet 1 tablet  1 tablet Oral BID PRN Candelaria Stagers T, MD   1 tablet at 08/30/22 1349   sodium bicarbonate tablet 650 mg  650 mg Oral TID Candelaria Stagers T, MD   650 mg at 08/30/22 1734   traZODone (DESYREL) tablet 50 mg  50 mg Oral QHS PRN Almon Hercules, MD   50 mg at 08/30/22 0011    Musculoskeletal: Strength & Muscle Tone: within  normal limits Gait & Station: normal Patient leans: N/A      Psychiatric Specialty Exam:  Presentation  General Appearance:  Appropriate for Environment; Casual  Eye Contact: Fair  Speech: Clear and Coherent; Normal Rate  Speech Volume: Normal  Handedness: Right   Mood and Affect  Mood: Anxious; Depressed; Dysphoric  Affect: Congruent   Thought Process  Thought Processes: Coherent; Linear  Descriptions of Associations:Tangential  Orientation:Full (Time, Place and Person)  Thought Content:Rumination  History of Schizophrenia/Schizoaffective disorder:Yes  Duration of Psychotic Symptoms:Greater than six months  Hallucinations:Hallucinations: Auditory Description of Auditory Hallucinations: whispers " tells me Im worthless."   Ideas of Reference:None  Suicidal Thoughts:yes  Homicidal Thoughts:denies   Sensorium  Memory: Immediate Fair; Recent Fair; Remote Fair  Judgment: Fair  Insight: Poor   Executive Functions  Concentration: Fair  Attention Span: Fair  Recall: Fair  Fund of Knowledge: Good  Language: Good   Psychomotor Activity  Psychomotor Activity: retardation   Assets  Assets: Communication Skills; Desire for Improvement; Financial Resources/Insurance; Resilience   Sleep  Sleep: fair   Physical Exam: Physical Exam Constitutional:      Appearance: Normal appearance.  HENT:     Head: Normocephalic and atraumatic.     Nose: Nose normal.  Eyes:     Pupils: Pupils are equal, round, and reactive to light.  Pulmonary:     Effort: Pulmonary effort is normal.  Skin:    General: Skin is warm and dry.  Neurological:     General: No focal deficit present.     Mental Status: She is alert and oriented to person, place, and time.  Psychiatric:        Attention and Perception: Attention normal. She perceives auditory hallucinations.        Mood and Affect: Mood is anxious. Affect is  blunt.        Speech:  Speech normal.        Behavior: Behavior normal. Behavior is cooperative.        Thought Content: Thought content includes suicidal ideation.        Cognition and Memory: Cognition and memory normal.        Judgment: Judgment is impulsive.    Review of Systems  Constitutional:  Positive for malaise/fatigue.  HENT: Negative.    Eyes: Negative.   Respiratory: Negative.    Cardiovascular: Negative.   Gastrointestinal: Negative.   Neurological: Negative.   Psychiatric/Behavioral:  Positive for depression. Negative for suicidal ideas. The patient is nervous/anxious and has insomnia.   All other systems reviewed and are negative.  Blood pressure 125/78, pulse 100, temperature 98.2 F (36.8 C), temperature source Oral, resp. rate 17, height 5\' 5"  (1.651 m), weight 100.3 kg, last menstrual period 08/08/2022, SpO2 98%. Body mass index is 36.8 kg/m.  Treatment Plan Summary: Daily contact with patient to assess and evaluate symptoms and progress in treatment and Medication management Plan/Recommendations: -Continue 1:1 sitter for safety -Continue current medications. Continue olanzapine 10mg  po daily for psychosis.  -She received one olanzapine 10mg  IM in a single dose, which appeared to be effective at this time. Patient continues to benefit from limit setting and boundaries as she can split staff.  -Consider TOC/Social worker consult to facilitate psych inpatient admission after patient is medically stable.  Consider [facility] referral.  --[facility] referral for self neglect/abuse, inability to keep self safe in the community.   Disposition: Recommend psychiatric Inpatient admission when medically cleared. Supportive therapy provided about ongoing stressors.  Maryagnes Amos, FNP

## 2022-08-30 NOTE — Progress Notes (Signed)
Patient observed standing in the doorway of her room and walking out into hallway.  Patient told that she had to stay in the room, preferably in the bed.  Patient refused, charge nurse was made aware and security was called.  Security officers assisted patient back into the bed and bilateral wrist restraints were applied.  Sitter remains at bedside and safety measures are all in place.

## 2022-08-30 NOTE — TOC Progression Note (Signed)
Transition of Care [facility]) - Progression Note    Patient Details  Name: [name] MRN: [id number] Date of Birth: [birth year]  Transition of Care [facility]) CM/SW Contact  Azeez Dunker A [place], [place] Phone Number: 08/30/2022, 10:23 AM  Clinical Narrative:     CSW completed pt's IVC paperwork. Case # [id number]. Signed custody order in pt's physical chart. Expires 09/05/22.   TOC will continue to follow.        Expected Discharge Plan and Services                                               Social Determinants of Health (SDOH) Interventions SDOH Screenings   Food Insecurity: No Food Insecurity (07/29/2022)  Housing: Low Risk  (07/29/2022)  Transportation Needs: No Transportation Needs (07/29/2022)  Recent Concern: Transportation Needs - Unmet Transportation Needs (05/06/2022)  Utilities: Not At Risk (07/29/2022)  Alcohol Screen: Low Risk  (07/29/2022)  Depression (PHQ2-9): Medium Risk (03/10/2022)  Tobacco Use: Medium Risk (08/22/2022)    Readmission Risk Interventions    06/06/2022    3:38 PM  Readmission Risk Prevention Plan  Transportation Screening Complete  Medication Review (RN Care Manager) Complete  PCP or Specialist appointment within 3-5 days of discharge Complete  HRI or Home Care Consult Complete  SW Recovery Care/Counseling Consult Complete  Palliative Care Screening Not Applicable  Skilled Nursing Facility Not Applicable

## 2022-08-30 NOTE — Progress Notes (Signed)
Entered room to check on patient and found her to be completely out of the 4 point restraints.  Sitter at bedside was unaware patient had removed restraints.  Charge nurse called into room and observed patient out of restraints and states he is ok with that as long as patient remains nonviolent.

## 2022-08-30 NOTE — Progress Notes (Addendum)
Per MD ordered all belongings needed to be removed from room. Security came up and removed phone and all clothing and bags. Pt refused for her belongings to be taken. Phone is locked up with security. The rest of belongings kept and labled at nurses station. PT then started throwing objects in the room. Yelling at staff down the hall. Threatening to strangle/kill multiple staff members. Security called back up to unit. Primary MD and Psych aware of her new HI feelings. Primary RN gave PRN ativan. Pt and safety sitter back in room.

## 2022-08-30 NOTE — Progress Notes (Signed)
PROGRESS NOTE    [name]  ZOX:[id number] DOB: 20-Aug-1990 DOA: 08/22/2022 PCP: [facility]   Brief Narrative:  The patient is a 32 y.o. F with PMH of adjustment disorder with mixed anxiety and depressed mood, autism, bipolar 1 disorder, fragile x syndrome, seizures not on meds, schizoaffective disorder and multiple suicide attempts in the last year who presented to the ED after ingesting numerous medications in a suicide attempt.  She reported taking #30 metoprolol 25mg  tablets, #60 Hydroxyzine 25mg , #7 Lexapro 20mg , #21 Gabapentin 600mg , #7 Flexeril 10mg , #7 protonix 40mg  and #7 Prazosin 1mg .   She was slightly bradycardic and hypotensive on EMS arrival which improved with fluids.  Poison control was contacted and as she was within 2hrs of ingestion was given activated charcoal.  Placed under IVC in ED.  Poison control also recommended ICU admission for close Qtc and electrolyte monitoring and possible pressors.  At the time of admission, pt was somnolent but arousable to voice and following commands. She was intubated and started on vasopressor and glucagon.  Also started on ceftriaxone for possible pneumonia.  Patient was extubated on 7/18 and transferred to the floor on 7/19. She was transferred to [facility] service on 08/25/22.   Psychiatry recommended inpatient psychiatric hospitalization, and placed APS orders. She remains medically stable for D/C now and awaiting Inpatient Psychiatric Placement. She is having quite a bit of behavioral issues and hearing voices today had to be restrained in 4-point restraints and given IM Zyprexa but from a medical standpoint she can be discharged at any time.  Assessment and Plan:  Suicidal Attempt with intentional overdose:  -Overdose with multiple medications including #30 metoprolol 25mg  tablets, #60 Hydroxyzine 25mg , #7 Lexapro 20mg , #21 Gabapentin 600mg , #7 Flexeril 10mg , #7 protonix 40mg  and #7 Prazosin 1mg .  Poison control consulted.  Received charcoal. Denies ETOH or illicit drug use.  Extended UDS negative. -Required vasopressor, glucagon, intubation and mechanical ventilation 7/17-7/18.  Room air and hemodynamically stable. -Placed under IVC in the ED and renewed. -Continue one-to-one Recruitment consultant -Patient is medically stable pending inpatient psych bed -Psychiatry placed APS referral as well. -Further Care per Psych and she had to be placed in a 4 point restraints and given IM Zyprexa today given her significant behavioral disturbances and homicidal ideations along with hearing voices in her head   Anxiety and Depression, Schizoaffective Disorder, Adjustment Disorder, bipolar disorder, fibromyalgia and auditory hallucination: -Hallucinations seems to have resolved.  Patient is on Cogentin, Zyprexa, Atarax, prazosin and Lexapro at home -Appreciate input by psych and further care per their Protocol -Continue home meds.  Lexapro changed to Cymbalta -Zyprexa as needed -Patient started hearing voices in her head that were "too much for her to handle" and then she threatened "that she was in a kill and strangle one of the staff members" -Security was paged and she had to be placed in 4-point restraints and given IM Zyprexa.  She still has a one-to-one sitter at bedside   CAP -No respiratory symptoms.   -CXR showed atelectasis or infiltrate in left midlung.  Pro-Cal elevated at 14.43 -WBC Trend: Recent Labs  Lab 08/22/22 1420 08/22/22 1536 08/23/22 0422 08/25/22 0726 08/26/22 0952 08/28/22 1119 08/29/22 0923  WBC 8.8 9.0 16.1* 6.4 6.8 11.6* 8.3  -Received ceftriaxone. SpO2: 98 % O2 Flow Rate (L/min): 2 L/min FiO2 (%): 40 % -Completed course with p.o. Augmentin.   HTN -Normotensive. -Resume home Lopressor -Continue monitor blood pressures per protocol -Last blood pressure reading was 125/78  Sinus Tachycardia -Resolved. -Continue home metoprolol.   Metabolic Acidosis -Improved.  Patient's CO2 is now 23,  anion gap is 7, chloride level 106 -P.o. sodium bicarbonate. -Continue monitor trend and repeat CMP in a.m.   Hypomagnesemia -Patient's Mag Level Trend: Recent Labs  Lab 08/22/22 1420 08/22/22 2337 08/23/22 0422 08/25/22 0726 08/26/22 0952 08/28/22 1119 08/29/22 0923  MG 2.0 1.9 1.9 1.5* 1.7 1.8 1.7  -Replete with IV Mag Sulfate 2 Grams -Continue to Monitor and Replete as Necessary -Repeat Mag in the AM   Microcytic Anemia -H&H relatively stable.  Anemia panel with some iron deficiency with elevated TIBC and low ferritin. -Hgb/Hct Trend: Recent Labs  Lab 08/22/22 1536 08/22/22 1934 08/23/22 0422 08/25/22 0726 08/26/22 0952 08/28/22 1119 08/29/22 0923  HGB 9.4* 10.5* 9.6* 9.9* 9.2* 11.7* 10.2*  HCT 31.8* 31.0* 31.2* 31.9* 30.4* 38.4 33.0*  MCV 81.1  --  78.4* 79.4* 77.6* 80.0 77.1*  -P.o. ferrous sulfate with bowel regimen initiated at 325 mg po BID -Continue to Monitor for S/Sx of Bleeding; No overt bleeding noted  -Repeat CBC in the AM    History of Seizure Disorder -Stable.  Not on meds.   History of Asthma -Stable. -Continue home meds with albuterol 3 mL IH every 6 hours as needed for wheezing or shortness of breath   Dysuria -Patient reported dysuria this morning.  She is already was on antibiotics.  Likely due to catheterization while in ICU. -Pyridium 200 mg 3 times daily for 2 days now stopped   GERD/GI Prophylaxis -Continue PPI with Pantoprazole and Famotidine 20 mg po BID  Hypoalbuminemia -Patient's Albumin Trend: Recent Labs  Lab 08/09/22 0020 08/20/22 2040 08/22/22 1420 08/23/22 0422 08/26/22 0952 08/28/22 1119 08/29/22 0923  ALBUMIN 3.5 3.9 3.5 3.0* 3.2* 4.1 3.3*  -Continue to Monitor and Trend and repeat CMP in the AM    Obesity -Complicates overall prognosis and care -Estimated body mass index is 36.8 kg/m as calculated from the following:   Height as of this encounter: 5\' 5"  (1.651 m).   Weight as of this encounter: 100.3 kg.   -Weight Loss and Dietary Counseling given .  DVT prophylaxis: SCDs Start: 08/22/22 1514    Code Status: Full Code Family Communication: No family present at bedside  Disposition Plan:  Level of care: Med-Surg Status is: Inpatient Remains inpatient appropriate because: Medically stable for D/C but has no Bed at Inpatient Psychiatric Facility   Consultants:  Psychiatry PCCM Transfer  Procedures:  As delineated as above  Antimicrobials:  Anti-infectives (From admission, onward)    Start     Dose/Rate Route Frequency Ordered Stop   08/26/22 1415  amoxicillin-clavulanate (AUGMENTIN) 875-125 MG per tablet 1 tablet        1 tablet Oral Every 12 hours 08/26/22 1316 08/27/22 2205   08/23/22 1200  cefTRIAXone (ROCEPHIN) 2 g in sodium chloride 0.9 % 100 mL IVPB  Status:  Discontinued        2 g 200 mL/hr over 30 Minutes Intravenous Every 24 hours 08/23/22 0931 08/26/22 1316       Subjective: And examined at bedside today and she was resting and complained of some slight pain near her neck.  Subsequently she became extremely agitated when her belongings were confiscated due to concerns for her safety.  She then became verbally abusive and combative and became very threatening.  Security was called and psychiatry evaluated and placed her in 4-point restraints and give her IM Zyprexa.  Still awaiting bed at  inpatient psychiatric facility but remains medically stable.  Objective: Vitals:   08/30/22 0402 08/30/22 0500 08/30/22 1101 08/30/22 1500  BP: 110/66  124/77 125/78  Pulse: 80  (!) 105 100  Resp: 16  16 17   Temp: 97.8 F (36.6 C)  98.7 F (37.1 C) 98.2 F (36.8 C)  TempSrc: Oral  Oral Oral  SpO2: 100%  98% 98%  Weight:  100.3 kg    Height:        Intake/Output Summary (Last 24 hours) at 08/30/2022 1712 Last data filed at 08/30/2022 1302 Gross per 24 hour  Intake 720 ml  Output --  Net 720 ml   Filed Weights   08/28/22 0500 08/29/22 0500 08/30/22 0500  Weight: 97.3 kg  97.2 kg 100.3 kg   Examination: Physical Exam:  Constitutional: WN/WD obese Caucasian female who is resting on my evaluation Respiratory: Diminished to auscultation bilaterally, no wheezing, rales, rhonchi or crackles. Normal respiratory effort and patient is not tachypenic. No accessory muscle use.  Unlabored breathing Cardiovascular: RRR, no murmurs / rubs / gallops. S1 and S2 auscultated. No extremity edema.  Abdomen: Soft, non-tender, distended secondary to body habitus. Bowel sounds positive.  GU: Deferred. Musculoskeletal: No clubbing / cyanosis of digits/nails. No joint deformity upper and lower extremities Skin: Has a Mepilex pad on her arm lesion Neurologic: CN 2-12 grossly intact with no focal deficits. Romberg sign cerebellar reflexes not assessed.  Psychiatric: Significant depressed appearing  Data Reviewed: I have personally reviewed following labs and imaging studies  CBC: Recent Labs  Lab 08/25/22 0726 08/26/22 0952 08/28/22 1119 08/29/22 0923  WBC 6.4 6.8 11.6* 8.3  NEUTROABS  --   --   --  5.0  HGB 9.9* 9.2* 11.7* 10.2*  HCT 31.9* 30.4* 38.4 33.0*  MCV 79.4* 77.6* 80.0 77.1*  PLT 272 285 345 265   Basic Metabolic Panel: Recent Labs  Lab 08/25/22 0726 08/26/22 0952 08/28/22 1119 08/29/22 0923  NA 137 135 137 136  K 3.9 4.1 3.9 3.7  CL 108 105 104 106  CO2 17* 17* 23 23  GLUCOSE 84 88 98 111*  BUN 19 15 14 12   CREATININE 0.91 0.84 0.80 0.68  CALCIUM 8.7* 8.8* 9.6 8.7*  MG 1.5* 1.7 1.8 1.7  PHOS  --  3.0 3.0 3.1   GFR: Estimated Creatinine Clearance: 117.3 mL/min (by C-G formula based on SCr of 0.68 mg/dL). Liver Function Tests: Recent Labs  Lab 08/26/22 0952 08/28/22 1119 08/29/22 0923  AST  --   --  20  ALT  --   --  16  ALKPHOS  --   --  71  BILITOT  --   --  0.2*  PROT  --   --  6.5  ALBUMIN 3.2* 4.1 3.3*   No results for input(s): "LIPASE", "AMYLASE" in the last 168 hours. No results for input(s): "AMMONIA" in the last 168  hours. Coagulation Profile: No results for input(s): "INR", "PROTIME" in the last 168 hours. Cardiac Enzymes: No results for input(s): "CKTOTAL", "CKMB", "CKMBINDEX", "TROPONINI" in the last 168 hours. BNP (last 3 results) No results for input(s): "PROBNP" in the last 8760 hours. HbA1C: No results for input(s): "HGBA1C" in the last 72 hours. CBG: Recent Labs  Lab 08/24/22 1135 08/24/22 1610 08/24/22 2041 08/24/22 2335 08/25/22 0730  GLUCAP 107* 102* 76 119* 93   Lipid Profile: No results for input(s): "CHOL", "HDL", "LDLCALC", "TRIG", "CHOLHDL", "LDLDIRECT" in the last 72 hours. Thyroid Function Tests: No results for input(s): "  TSH", "T4TOTAL", "FREET4", "T3FREE", "THYROIDAB" in the last 72 hours. Anemia Panel: No results for input(s): "VITAMINB12", "FOLATE", "FERRITIN", "TIBC", "IRON", "RETICCTPCT" in the last 72 hours. Sepsis Labs: Recent Labs  Lab 08/25/22 1100  PROCALCITON 14.43    Recent Results (from the past 240 hour(s))  MRSA Next Gen by PCR, Nasal     Status: Abnormal   Collection Time: 08/22/22  4:24 PM   Specimen: Nasal Mucosa; Nasal Swab  Result Value Ref Range Status   MRSA by PCR Next Gen DETECTED (A) NOT DETECTED Final    Comment: RESULT CALLED TO, READ BACK BY AND VERIFIED WITH: rn s. [id number] [id number] @ 1933 fh (NOTE) The GeneXpert MRSA Assay (FDA approved for NASAL specimens only), is one component of a comprehensive MRSA colonization surveillance program. It is not intended to diagnose MRSA infection nor to guide or monitor treatment for MRSA infections. Test performance is not FDA approved in patients less than 55 years old. Performed at [facility], [address], [place], [place] [address]   Culture, blood (Routine X 2) w Reflex to ID Panel     Status: None   Collection Time: 08/23/22  9:28 AM   Specimen: BLOOD RIGHT HAND  Result Value Ref Range Status   Specimen Description BLOOD RIGHT HAND  Final   Special Requests   Final    BOTTLES  DRAWN AEROBIC AND ANAEROBIC Blood Culture adequate volume   Culture   Final    NO GROWTH 5 DAYS Performed at [facility], [address] [place], [place] [address]    Report Status 08/28/2022 FINAL  Final  Culture, blood (Routine X 2) w Reflex to ID Panel     Status: None   Collection Time: 08/23/22  9:28 AM   Specimen: BLOOD LEFT HAND  Result Value Ref Range Status   Specimen Description BLOOD LEFT HAND  Final   Special Requests   Final    BOTTLES DRAWN AEROBIC ONLY Blood Culture adequate volume   Culture   Final    NO GROWTH 5 DAYS Performed at [facility], [address], [place], [place] [address]    Report Status 08/28/2022 FINAL  Final    Radiology Studies: No results found.  Scheduled Meds:  benztropine  1 mg Oral QHS   cyclobenzaprine  10 mg Oral QHS   diclofenac Sodium  2 g Topical QID   diphenhydrAMINE  50 mg Intravenous Once   DULoxetine  30 mg Oral Daily   famotidine  20 mg Oral BID   ferrous sulfate  325 mg Oral BID WC   gabapentin  600 mg Oral TID   metoprolol tartrate  12.5 mg Oral BID   mometasone-formoterol  2 puff Inhalation BID   mupirocin cream   Topical Daily   OLANZapine  10 mg Oral QHS   pantoprazole  40 mg Oral QAC breakfast   prazosin  1 mg Oral QHS   sodium bicarbonate  650 mg Oral TID   Continuous Infusions:  sodium chloride Stopped (08/24/22 1347)    LOS: 8 days   Marguerita Merles, DO [facility] Available via Epic secure chat 7am-7pm After these hours, please refer to coverage provider listed on [email or web address] 08/30/2022, 5:12 PM

## 2022-08-31 DIAGNOSIS — F315 Bipolar disorder, current episode depressed, severe, with psychotic features: Secondary | ICD-10-CM | POA: Diagnosis not present

## 2022-08-31 DIAGNOSIS — T50902A Poisoning by unspecified drugs, medicaments and biological substances, intentional self-harm, initial encounter: Secondary | ICD-10-CM | POA: Diagnosis not present

## 2022-08-31 DIAGNOSIS — T1491XA Suicide attempt, initial encounter: Secondary | ICD-10-CM | POA: Diagnosis not present

## 2022-08-31 DIAGNOSIS — J4520 Mild intermittent asthma, uncomplicated: Secondary | ICD-10-CM | POA: Diagnosis not present

## 2022-08-31 MED ORDER — RISPERIDONE 1 MG PO TBDP
1.0000 mg | ORAL_TABLET | Freq: Two times a day (BID) | ORAL | Status: DC | PRN
  Administered 2022-09-05 – 2022-09-06 (×3): 1 mg via ORAL
  Filled 2022-08-31 (×4): qty 1

## 2022-08-31 NOTE — Progress Notes (Signed)
Pt started to put bed sheet around her neck and pull it tight. The sitter made this RN aware. We attempted to reason with the pt to stop threatening/trying to hurt herself. The pt continues to say that she wants to kill herself. Charge RN made aware. He called security and attempted to reason with the pt as well. The first security guard attempted to reason with the pt as well, but he was unsuccessful. The second security guard was a female and the pt listened and stopped putting the sheet around her neck and went back to bed. The pt appears to listen and respond better to female care givers.

## 2022-08-31 NOTE — TOC Progression Note (Signed)
Transition of Care [facility]) - Progression Note    Patient Details  Name: [name] MRN: [id number] Date of Birth: 19-Mar-1990  Transition of Care [facility]) CM/SW Contact  Sunshyne Horvath A [place], [place] Phone Number: 08/31/2022, 1:49 PM  Clinical Narrative:    CSW sent updated referral information to [facility], [facility], for consideration of admission based on pt's recent behaviors. CSW spoke with staff member who stated fax had been received. CSW provided verbal demographic information on pt.   They stated nursing would follow up later today or when they have availability regarding admission decision.   Inpatient psych referrals to other facilities remain pending.   TOC will continue to follow.       Expected Discharge Plan and Services                                               Social Determinants of Health (SDOH) Interventions SDOH Screenings   Food Insecurity: No Food Insecurity (07/29/2022)  Housing: Low Risk  (07/29/2022)  Transportation Needs: No Transportation Needs (07/29/2022)  Recent Concern: Transportation Needs - Unmet Transportation Needs (05/06/2022)  Utilities: Not At Risk (07/29/2022)  Alcohol Screen: Low Risk  (07/29/2022)  Depression (PHQ2-9): Medium Risk (03/10/2022)  Tobacco Use: Medium Risk (08/22/2022)    Readmission Risk Interventions    06/06/2022    3:38 PM  Readmission Risk Prevention Plan  Transportation Screening Complete  Medication Review (RN Care Manager) Complete  PCP or Specialist appointment within 3-5 days of discharge Complete  HRI or Home Care Consult Complete  SW Recovery Care/Counseling Consult Complete  Palliative Care Screening Not Applicable  Skilled Nursing Facility Not Applicable

## 2022-08-31 NOTE — Progress Notes (Signed)
Pt came out of her room with the sitter following her. The sitter was trying to get the pt to go back into the room. The pt was ignoring the sitter. This RN tried to talk to the pt and have her return to her room. The pt continued to ignor staff and kicked over two trash cans, hit the wall several times. Another RN that had the pt before was able to talk the pt into returning to her room and talked to her for several minutes about her thoughts and concerns.

## 2022-08-31 NOTE — Progress Notes (Signed)
PROGRESS NOTE    [name]  EGB:[id number] DOB: [birth year] DOA: 08/22/2022 PCP: [facility]   Brief Narrative:  The patient is a 32 y.o. F with PMH of adjustment disorder with mixed anxiety and depressed mood, autism, bipolar 1 disorder, fragile x syndrome, seizures not on meds, schizoaffective disorder and multiple suicide attempts in the last year who presented to the ED after ingesting numerous medications in a suicide attempt.  She reported taking #30 metoprolol 25mg  tablets, #60 Hydroxyzine 25mg , #7 Lexapro 20mg , #21 Gabapentin 600mg , #7 Flexeril 10mg , #7 protonix 40mg  and #7 Prazosin 1mg .   She was slightly bradycardic and hypotensive on EMS arrival which improved with fluids.  Poison control was contacted and as she was within 2hrs of ingestion was given activated charcoal.  Placed under IVC in ED.  Poison control also recommended ICU admission for close Qtc and electrolyte monitoring and possible pressors.  At the time of admission, pt was somnolent but arousable to voice and following commands. She was intubated and started on vasopressor and glucagon.  Also started on ceftriaxone for possible pneumonia.  Patient was extubated on 7/18 and transferred to the floor on 7/19. She was transferred to [facility] service on 08/25/22.   Psychiatry recommended inpatient psychiatric hospitalization, and placed APS orders. She remains medically stable for D/C now and awaiting Inpatient Psychiatric Placement. She is having quite a bit of behavioral issues and hearing voices intermittently with paranoid delusions and has had to be restrained in 4-point restraints and given IM Zyprexa but from a medical standpoint she can be discharged at any time.  Assessment and Plan:  Suicidal Attempt with intentional overdose:  -Overdose with multiple medications including #30 metoprolol 25mg  tablets, #60 Hydroxyzine 25mg , #7 Lexapro 20mg , #21 Gabapentin 600mg , #7 Flexeril 10mg , #7 protonix 40mg  and #7  Prazosin 1mg .  Poison control consulted. Received charcoal. Denies ETOH or illicit drug use.  Extended UDS negative. -Required vasopressor, glucagon, intubation and mechanical ventilation 7/17-7/18.  Room air and hemodynamically stable. -Placed under IVC in the ED and renewed. -Continue one-to-one Recruitment consultant -Patient is medically stable pending inpatient psych bed -Psychiatry placed APS referral as well. -Further Care per Psych and she had to be placed in a 4 point restraints and given IM Zyprexa today given her significant behavioral disturbances and homicidal ideations along with hearing voices in her head   Anxiety and Depression, Schizoaffective Disorder, Adjustment Disorder, bipolar disorder, fibromyalgia and auditory hallucination: -Hallucinations seems to have resolved.  Patient is on Cogentin, Zyprexa, Atarax, prazosin and Lexapro at home -Appreciate input by psych and further care per their Protocol -Continue home meds.  Lexapro changed to Cymbalta -Zyprexa as needed -Patient started hearing voices in her head that were "too much for her to handle" and then she threatened "that she was in a kill and strangle one of the staff members" -Security was paged and she had to be placed in 4-point restraints and given IM Zyprexa.  She still has a one-to-one sitter at bedside and will continue   CAP -No respiratory symptoms.   -CXR showed atelectasis or infiltrate in left midlung.  Pro-Cal elevated at 14.43 -WBC Trend: Recent Labs  Lab 08/22/22 1420 08/22/22 1536 08/23/22 0422 08/25/22 0726 08/26/22 0952 08/28/22 1119 08/29/22 0923  WBC 8.8 9.0 16.1* 6.4 6.8 11.6* 8.3  -Received ceftriaxone. SpO2: 99 % O2 Flow Rate (L/min): 2 L/min FiO2 (%): 40 % -Completed course with p.o. Augmentin.   HTN -Normotensive. -Resume home Lopressor -Continue monitor blood pressures  per protocol -Last blood pressure reading was 112/63   Sinus Tachycardia -Resolved. -Continue home metoprolol.    Metabolic Acidosis -Improved.  Patient's CO2 is now 23, anion gap is 7, chloride level 106 -P.o. sodium bicarbonate. -Continue monitor trend and repeat CMP in a.m.   Hypomagnesemia -Patient's Mag Level Trend: Recent Labs  Lab 08/22/22 1420 08/22/22 2337 08/23/22 0422 08/25/22 0726 08/26/22 0952 08/28/22 1119 08/29/22 0923  MG 2.0 1.9 1.9 1.5* 1.7 1.8 1.7  -Replete with IV Mag Sulfate 2 Grams -Continue to Monitor and Replete as Necessary -Repeat Mag in the AM   Microcytic Anemia -H&H relatively stable.  Anemia panel with some iron deficiency with elevated TIBC and low ferritin. -Hgb/Hct Trend: Recent Labs  Lab 08/22/22 1536 08/22/22 1934 08/23/22 0422 08/25/22 0726 08/26/22 0952 08/28/22 1119 08/29/22 0923  HGB 9.4* 10.5* 9.6* 9.9* 9.2* 11.7* 10.2*  HCT 31.8* 31.0* 31.2* 31.9* 30.4* 38.4 33.0*  MCV 81.1  --  78.4* 79.4* 77.6* 80.0 77.1*  -P.o. ferrous sulfate with bowel regimen initiated at 325 mg po BID -Continue to Monitor for S/Sx of Bleeding; No overt bleeding noted  -Repeat CBC in the AM    History of Seizure Disorder -Stable.  Not on meds.   History of Asthma -Stable. -Continue home meds with Albuterol 3 mL IH every 6 hours as needed for wheezing or shortness of breath   Dysuria -Patient reported dysuria this morning.  She is already was on antibiotics.  Likely due to catheterization while in ICU. -Pyridium 200 mg 3 times daily for 2 days now stopped   GERD/GI Prophylaxis -Continue PPI with Pantoprazole and Famotidine 20 mg po BID  Hypoalbuminemia -Patient's Albumin Trend: Recent Labs  Lab 08/09/22 0020 08/20/22 2040 08/22/22 1420 08/23/22 0422 08/26/22 0952 08/28/22 1119 08/29/22 0923  ALBUMIN 3.5 3.9 3.5 3.0* 3.2* 4.1 3.3*  -Continue to Monitor and Trend and repeat CMP in the AM    Obesity -Complicates overall prognosis and care -Estimated body mass index is 36.8 kg/m as calculated from the following:   Height as of this encounter: 5'  5" (1.651 m).   Weight as of this encounter: 100.3 kg.  -Weight Loss and Dietary Counseling given   DVT prophylaxis: SCDs Start: 08/22/22 1514    Code Status: Full Code Family Communication: No family present at bedside  Disposition Plan:  Level of care: Med-Surg Status is: Inpatient Remains inpatient appropriate because: Medically stable for D/C   Consultants:  Psychiatry PCCM Transfer  Procedures:  As delineated as above  Antimicrobials:  Anti-infectives (From admission, onward)    Start     Dose/Rate Route Frequency Ordered Stop   08/26/22 1415  amoxicillin-clavulanate (AUGMENTIN) 875-125 MG per tablet 1 tablet        1 tablet Oral Every 12 hours 08/26/22 1316 08/27/22 2205   08/23/22 1200  cefTRIAXone (ROCEPHIN) 2 g in sodium chloride 0.9 % 100 mL IVPB  Status:  Discontinued        2 g 200 mL/hr over 30 Minutes Intravenous Every 24 hours 08/23/22 0931 08/26/22 1316       Subjective: Seen and at bedside and she had just gotten very agitated and punched the wall and kicked over trash cans.  States that she is having pain in her neck from where her central line was removed but states that it hurts to turn her neck away from it due to the pain.  Trying a hot pack.  No nausea or vomiting.  No other concerns or  complaints at this time and remains medically stable for discharge.  Objective: Vitals:   08/30/22 1500 08/30/22 1914 08/30/22 2047 08/31/22 0810  BP: 125/78 122/67  112/63  Pulse: 100 (!) 106 (!) 105 86  Resp: 17 18 18 18   Temp: 98.2 F (36.8 C)   97.8 F (36.6 C)  TempSrc: Oral   Oral  SpO2: 98% 98% 98% 99%  Weight:      Height:        Intake/Output Summary (Last 24 hours) at 08/31/2022 1708 Last data filed at 08/31/2022 1431 Gross per 24 hour  Intake 480 ml  Output --  Net 480 ml   Filed Weights   08/28/22 0500 08/29/22 0500 08/30/22 0500  Weight: 97.3 kg 97.2 kg 100.3 kg   Examination: Physical Exam:  Constitutional: WN/WD obese Caucasian female  who is paranoid Respiratory: Diminished to auscultation bilaterally, no wheezing, rales, rhonchi or crackles. Normal respiratory effort and patient is not tachypenic. No accessory muscle use. Unlabored breathing  Cardiovascular: RRR, no murmurs / rubs / gallops. S1 and S2 auscultated. No extremity edema. Abdomen: Soft, non-tender, Distended 2/2 body habitus. Bowel sounds positive.  GU: Deferred. Musculoskeletal: No clubbing / cyanosis of digits/nails. No joint deformity upper and lower extremities. Skin: Has a hot pack on the left side of her neck and a Mepilex pad on her Arm lesion.  Neurologic: CN 2-12 grossly intact with no focal deficits. Romberg sign and cerebellar reflexes not assessed.  Psychiatric: Anxious and paranoid appearing  Data Reviewed: I have personally reviewed following labs and imaging studies  CBC: Recent Labs  Lab 08/25/22 0726 08/26/22 0952 08/28/22 1119 08/29/22 0923  WBC 6.4 6.8 11.6* 8.3  NEUTROABS  --   --   --  5.0  HGB 9.9* 9.2* 11.7* 10.2*  HCT 31.9* 30.4* 38.4 33.0*  MCV 79.4* 77.6* 80.0 77.1*  PLT 272 285 345 265   Basic Metabolic Panel: Recent Labs  Lab 08/25/22 0726 08/26/22 0952 08/28/22 1119 08/29/22 0923  NA 137 135 137 136  K 3.9 4.1 3.9 3.7  CL 108 105 104 106  CO2 17* 17* 23 23  GLUCOSE 84 88 98 111*  BUN 19 15 14 12   CREATININE 0.91 0.84 0.80 0.68  CALCIUM 8.7* 8.8* 9.6 8.7*  MG 1.5* 1.7 1.8 1.7  PHOS  --  3.0 3.0 3.1   GFR: Estimated Creatinine Clearance: 117.3 mL/min (by C-G formula based on SCr of 0.68 mg/dL). Liver Function Tests: Recent Labs  Lab 08/26/22 0952 08/28/22 1119 08/29/22 0923  AST  --   --  20  ALT  --   --  16  ALKPHOS  --   --  71  BILITOT  --   --  0.2*  PROT  --   --  6.5  ALBUMIN 3.2* 4.1 3.3*   No results for input(s): "LIPASE", "AMYLASE" in the last 168 hours. No results for input(s): "AMMONIA" in the last 168 hours. Coagulation Profile: No results for input(s): "INR", "PROTIME" in the last  168 hours. Cardiac Enzymes: No results for input(s): "CKTOTAL", "CKMB", "CKMBINDEX", "TROPONINI" in the last 168 hours. BNP (last 3 results) No results for input(s): "PROBNP" in the last 8760 hours. HbA1C: No results for input(s): "HGBA1C" in the last 72 hours. CBG: Recent Labs  Lab 08/24/22 2041 08/24/22 2335 08/25/22 0730  GLUCAP 76 119* 93   Lipid Profile: No results for input(s): "CHOL", "HDL", "LDLCALC", "TRIG", "CHOLHDL", "LDLDIRECT" in the last 72 hours. Thyroid Function Tests: No  results for input(s): "TSH", "T4TOTAL", "FREET4", "T3FREE", "THYROIDAB" in the last 72 hours. Anemia Panel: No results for input(s): "VITAMINB12", "FOLATE", "FERRITIN", "TIBC", "IRON", "RETICCTPCT" in the last 72 hours. Sepsis Labs: Recent Labs  Lab 08/25/22 1100  PROCALCITON 14.43    Recent Results (from the past 240 hour(s))  MRSA Next Gen by PCR, Nasal     Status: Abnormal   Collection Time: 08/22/22  4:24 PM   Specimen: Nasal Mucosa; Nasal Swab  Result Value Ref Range Status   MRSA by PCR Next Gen DETECTED (A) NOT DETECTED Final    Comment: RESULT CALLED TO, READ BACK BY AND VERIFIED WITH: rn s. ACZYS [id number] @ 1933 fh (NOTE) The GeneXpert MRSA Assay (FDA approved for NASAL specimens only), is one component of a comprehensive MRSA colonization surveillance program. It is not intended to diagnose MRSA infection nor to guide or monitor treatment for MRSA infections. Test performance is not FDA approved in patients less than 23 years old. Performed at [facility], [address] [place], [place] [address]   Culture, blood (Routine X 2) w Reflex to ID Panel     Status: None   Collection Time: 08/23/22  9:28 AM   Specimen: BLOOD RIGHT HAND  Result Value Ref Range Status   Specimen Description BLOOD RIGHT HAND  Final   Special Requests   Final    BOTTLES DRAWN AEROBIC AND ANAEROBIC Blood Culture adequate volume   Culture   Final    NO GROWTH 5 DAYS Performed at [facility], [address], [place], [place] [address]    Report Status 08/28/2022 FINAL  Final  Culture, blood (Routine X 2) w Reflex to ID Panel     Status: None   Collection Time: 08/23/22  9:28 AM   Specimen: BLOOD LEFT HAND  Result Value Ref Range Status   Specimen Description BLOOD LEFT HAND  Final   Special Requests   Final    BOTTLES DRAWN AEROBIC ONLY Blood Culture adequate volume   Culture   Final    NO GROWTH 5 DAYS Performed at [facility], [address] [place], [place] [address]    Report Status 08/28/2022 FINAL  Final    Radiology Studies: No results found.  Scheduled Meds:  benztropine  1 mg Oral QHS   cyclobenzaprine  10 mg Oral QHS   diclofenac Sodium  2 g Topical QID   diphenhydrAMINE  50 mg Intravenous Once   DULoxetine  30 mg Oral Daily   ferrous sulfate  325 mg Oral BID WC   gabapentin  600 mg Oral TID   metoprolol tartrate  12.5 mg Oral BID   mometasone-formoterol  2 puff Inhalation BID   mupirocin cream   Topical Daily   OLANZapine  10 mg Oral QHS   pantoprazole  40 mg Oral QAC breakfast   prazosin  1 mg Oral QHS   sodium bicarbonate  650 mg Oral TID   Continuous Infusions:  sodium chloride Stopped (08/24/22 1347)    LOS: 9 days   Marguerita Merles, DO [facility] Available via Epic secure chat 7am-7pm After these hours, please refer to coverage provider listed on [email or web address] 08/31/2022, 5:08 PM

## 2022-08-31 NOTE — Progress Notes (Signed)
This RN received call from [facility] that they cannot take the pt.

## 2022-09-01 DIAGNOSIS — T1491XA Suicide attempt, initial encounter: Secondary | ICD-10-CM | POA: Diagnosis not present

## 2022-09-01 DIAGNOSIS — J4520 Mild intermittent asthma, uncomplicated: Secondary | ICD-10-CM | POA: Diagnosis not present

## 2022-09-01 DIAGNOSIS — F315 Bipolar disorder, current episode depressed, severe, with psychotic features: Secondary | ICD-10-CM | POA: Diagnosis not present

## 2022-09-01 DIAGNOSIS — T50902A Poisoning by unspecified drugs, medicaments and biological substances, intentional self-harm, initial encounter: Secondary | ICD-10-CM | POA: Diagnosis not present

## 2022-09-01 MED ORDER — DULOXETINE HCL 60 MG PO CPEP
60.0000 mg | ORAL_CAPSULE | Freq: Every day | ORAL | Status: DC
  Administered 2022-09-02 – 2022-10-19 (×48): 60 mg via ORAL
  Filled 2022-09-01 (×6): qty 1
  Filled 2022-09-01: qty 2
  Filled 2022-09-01 (×17): qty 1
  Filled 2022-09-01: qty 2
  Filled 2022-09-01 (×7): qty 1
  Filled 2022-09-01: qty 2
  Filled 2022-09-01 (×15): qty 1

## 2022-09-01 MED ORDER — OLANZAPINE 10 MG IM SOLR
10.0000 mg | Freq: Once | INTRAMUSCULAR | Status: DC | PRN
  Filled 2022-09-01 (×5): qty 10

## 2022-09-01 MED ORDER — OLANZAPINE 10 MG IM SOLR: 10.0000 mg | Freq: Once | INTRAMUSCULAR | Status: DC | PRN

## 2022-09-01 NOTE — Progress Notes (Signed)
PROGRESS NOTE    [name]  WGN:[id number] DOB: 1990/06/24 DOA: 08/22/2022 PCP: [facility]   Brief Narrative:  The patient is a 32 y.o. F with PMH of adjustment disorder with mixed anxiety and depressed mood, autism, bipolar 1 disorder, fragile x syndrome, seizures not on meds, schizoaffective disorder and multiple suicide attempts in the last year who presented to the ED after ingesting numerous medications in a suicide attempt.  She reported taking #30 metoprolol 25mg  tablets, #60 Hydroxyzine 25mg , #7 Lexapro 20mg , #21 Gabapentin 600mg , #7 Flexeril 10mg , #7 protonix 40mg  and #7 Prazosin 1mg .   She was slightly bradycardic and hypotensive on EMS arrival which improved with fluids.  Poison control was contacted and as she was within 2hrs of ingestion was given activated charcoal.  Placed under IVC in ED.  Poison control also recommended ICU admission for close Qtc and electrolyte monitoring and possible pressors.  At the time of admission, pt was somnolent but arousable to voice and following commands. She was intubated and started on vasopressor and glucagon.  Also started on ceftriaxone for possible pneumonia.  Patient was extubated on 7/18 and transferred to the floor on 7/19. She was transferred to [facility] service on 08/25/22.   Psychiatry recommended inpatient psychiatric hospitalization, and placed APS orders. She remains medically stable for D/C now and awaiting Inpatient Psychiatric Placement. She is having quite a bit of behavioral issues and hearing voices intermittently with paranoid delusions and has had to be restrained in 4-point restraints and given IM Zyprexa intermittently but from a medical standpoint she can be discharged at any time.  Assessment and Plan:  Suicidal Attempt with intentional overdose:  -Overdose with multiple medications including #30 metoprolol 25mg  tablets, #60 Hydroxyzine 25mg , #7 Lexapro 20mg , #21 Gabapentin 600mg , #7 Flexeril 10mg , #7  protonix 40mg  and #7 Prazosin 1mg .  Poison control consulted. Received charcoal. Denies ETOH or illicit drug use.  Extended UDS negative. -Required vasopressor, glucagon, intubation and mechanical ventilation 7/17-7/18.  Room air and hemodynamically stable. -Placed under IVC in the ED and renewed. -Continue one-to-one Recruitment consultant -Patient is medically stable pending inpatient psych bed -Psychiatry placed APS referral as well. -Further Care per Psych and she had to be placed in a 4 point restraints and given IM Zyprexa intermittently given her significant behavioral disturbances and homicidal ideations along with hearing voices in her head -Psychiatry Is increasing her Cymbalta from 30 mg to 60 mg daily   Anxiety and Depression, Schizoaffective Disorder, Adjustment Disorder, bipolar disorder, fibromyalgia and auditory hallucination: -Hallucinations seems to have resolved.  Patient is on Cogentin, Zyprexa, Atarax, prazosin and Lexapro at home -Appreciate input by psych and further care per their Protocol -Continue home meds.  Lexapro changed to Cymbalta -Zyprexa as needed and psychiatry recommending olanzapine 10 mg IM daily single dose as needed -Patient started hearing voices in her head that were "too much for her to handle" and then she threatened "that she was in a kill and strangle one of the staff members" -Intermittently has to be placed in 4-point restraints and given IM Zyprexa.  She still has a one-to-one sitter at bedside and will continue -Cymbalta has been increased from 30 mg to 60 mg daily and will be continuing olanzapine 10 mg p.o. daily nightly for psychosis and continuing gabapentin 600 mg 3 times daily for agitated mood/anxiety   CAP -No Respiratory Symptoms.   -CXR showed atelectasis or infiltrate in left midlung.  Pro-Cal elevated at 14.43 -WBC Trend: Recent Labs  Lab  08/22/22 1420 08/22/22 1536 08/23/22 0422 08/25/22 0726 08/26/22 0952 08/28/22 1119 08/29/22 0923   WBC 8.8 9.0 16.1* 6.4 6.8 11.6* 8.3  -Received ceftriaxone. SpO2: 97 % O2 Flow Rate (L/min): 2 L/min FiO2 (%): 40 % -Completed course with p.o. Augmentin.   HTN -Normotensive. -Resume home Lopressor -Continue monitor blood pressures per protocol -Last blood pressure reading was 111/61   Sinus Tachycardia -Resolved. -Continue home metoprolol.   Metabolic Acidosis -Improved.  Patient's CO2 is now 23, anion gap is 7, chloride level 106 -P.o. sodium bicarbonate. -Continue monitor trend and repeat CMP in a.m.   Hypomagnesemia -Patient's Mag Level Trend: Recent Labs  Lab 08/22/22 1420 08/22/22 2337 08/23/22 0422 08/25/22 0726 08/26/22 0952 08/28/22 1119 08/29/22 0923  MG 2.0 1.9 1.9 1.5* 1.7 1.8 1.7  -Continue to Monitor and Replete as Necessary -Repeat Mag in the AM   Microcytic Anemia -H&H relatively stable.  Anemia panel with some iron deficiency with elevated TIBC and low ferritin. -Hgb/Hct Trend: Recent Labs  Lab 08/22/22 1536 08/22/22 1934 08/23/22 0422 08/25/22 0726 08/26/22 0952 08/28/22 1119 08/29/22 0923  HGB 9.4* 10.5* 9.6* 9.9* 9.2* 11.7* 10.2*  HCT 31.8* 31.0* 31.2* 31.9* 30.4* 38.4 33.0*  MCV 81.1  --  78.4* 79.4* 77.6* 80.0 77.1*  -P.o. ferrous sulfate with bowel regimen initiated at 325 mg po BID -Continue to Monitor for S/Sx of Bleeding; No overt bleeding noted  -Repeat CBC in the AM    History of Seizure Disorder -Stable.  Not on meds.   History of Asthma -Stable. -Continue home meds with Albuterol 3 mL IH every 6 hours as needed for wheezing or shortness of breath   Dysuria -Patient reported dysuria this morning.  She is already was on antibiotics.  Likely due to catheterization while in ICU. -Pyridium 200 mg 3 times daily for 2 days now stopped   GERD/GI Prophylaxis -Continue PPI with Pantoprazole and Famotidine 20 mg po BID  Hypoalbuminemia -Patient's Albumin Trend: Recent Labs  Lab 08/09/22 0020 08/20/22 2040 08/22/22 1420  08/23/22 0422 08/26/22 0952 08/28/22 1119 08/29/22 0923  ALBUMIN 3.5 3.9 3.5 3.0* 3.2* 4.1 3.3*  -Continue to Monitor and Trend and repeat CMP in the AM    Obesity -Complicates overall prognosis and care -Estimated body mass index is 36.25 kg/m as calculated from the following:   Height as of this encounter: 5\' 5"  (1.651 m).   Weight as of this encounter: 98.8 kg.  -Weight Loss and Dietary Counseling given   DVT prophylaxis: SCDs Start: 08/22/22 1514    Code Status: Full Code Family Communication: No family present at bedside  Disposition Plan:  Level of care: Med-Surg Status is: Inpatient Remains inpatient appropriate because: Medically stable for D/C but has no Inpatient Psychiatry Bed available    Consultants:  Psychiatry PCCM Transfer  Procedures:  As delineated as above  Antimicrobials:  Anti-infectives (From admission, onward)    Start     Dose/Rate Route Frequency Ordered Stop   08/26/22 1415  amoxicillin-clavulanate (AUGMENTIN) 875-125 MG per tablet 1 tablet        1 tablet Oral Every 12 hours 08/26/22 1316 08/27/22 2205   08/23/22 1200  cefTRIAXone (ROCEPHIN) 2 g in sodium chloride 0.9 % 100 mL IVPB  Status:  Discontinued        2 g 200 mL/hr over 30 Minutes Intravenous Every 24 hours 08/23/22 0931 08/26/22 1316       Subjective: Seen and examined at bedside and this morning she was using a  towel to try to choke herself and then subsequently wants to towel was removed by staff she started kicking becoming combative.  She had to be placed in for port restraints and given IM olanzapine.  Still remains medically stable for transfer to inpatient behavioral facility.  Objective: Vitals:   08/31/22 0810 08/31/22 1956 09/01/22 0300 09/01/22 0901  BP: 112/63 111/62  111/61  Pulse: 86 91  96  Resp: 18 20  20   Temp: 97.8 F (36.6 C) 97.7 F (36.5 C)  97.7 F (36.5 C)  TempSrc: Oral Oral    SpO2: 99% 98%  97%  Weight:   98.8 kg   Height:         Intake/Output Summary (Last 24 hours) at 09/01/2022 1528 Last data filed at 09/01/2022 1208 Gross per 24 hour  Intake 1080 ml  Output --  Net 1080 ml   Filed Weights   08/29/22 0500 08/30/22 0500 09/01/22 0300  Weight: 97.2 kg 100.3 kg 98.8 kg   Examination: Physical Exam:  Constitutional: WN/WD occasion female who is currently calm now Respiratory: Diminished to auscultation bilaterally, no wheezing, rales, rhonchi or crackles. Normal respiratory effort and patient is not tachypenic. No accessory muscle use.  Unlabored breathing Cardiovascular: RRR, no murmurs / rubs / gallops. S1 and S2 auscultated. No extremity edema.  Abdomen: Soft, non-tender, distended secondary to body habitus. Bowel sounds positive.  GU: Deferred. Musculoskeletal: No clubbing / cyanosis of digits/nails. No joint deformity upper and lower extremities. Skin: No rashes, lesions, ulcers on limited skin evaluation. No induration; Warm and dry.  Neurologic: CN 2-12 grossly intact with no focal deficits. Romberg sign and cerebellar reflexes not assessed.  Psychiatric: She is calmer now but was paranoid this morning  Data Reviewed: I have personally reviewed following labs and imaging studies  CBC: Recent Labs  Lab 08/26/22 0952 08/28/22 1119 08/29/22 0923  WBC 6.8 11.6* 8.3  NEUTROABS  --   --  5.0  HGB 9.2* 11.7* 10.2*  HCT 30.4* 38.4 33.0*  MCV 77.6* 80.0 77.1*  PLT 285 345 265   Basic Metabolic Panel: Recent Labs  Lab 08/26/22 0952 08/28/22 1119 08/29/22 0923  NA 135 137 136  K 4.1 3.9 3.7  CL 105 104 106  CO2 17* 23 23  GLUCOSE 88 98 111*  BUN 15 14 12   CREATININE 0.84 0.80 0.68  CALCIUM 8.8* 9.6 8.7*  MG 1.7 1.8 1.7  PHOS 3.0 3.0 3.1   GFR: Estimated Creatinine Clearance: 116.4 mL/min (by C-G formula based on SCr of 0.68 mg/dL). Liver Function Tests: Recent Labs  Lab 08/26/22 0952 08/28/22 1119 08/29/22 0923  AST  --   --  20  ALT  --   --  16  ALKPHOS  --   --  71  BILITOT   --   --  0.2*  PROT  --   --  6.5  ALBUMIN 3.2* 4.1 3.3*   No results for input(s): "LIPASE", "AMYLASE" in the last 168 hours. No results for input(s): "AMMONIA" in the last 168 hours. Coagulation Profile: No results for input(s): "INR", "PROTIME" in the last 168 hours. Cardiac Enzymes: No results for input(s): "CKTOTAL", "CKMB", "CKMBINDEX", "TROPONINI" in the last 168 hours. BNP (last 3 results) No results for input(s): "PROBNP" in the last 8760 hours. HbA1C: No results for input(s): "HGBA1C" in the last 72 hours. CBG: No results for input(s): "GLUCAP" in the last 168 hours. Lipid Profile: No results for input(s): "CHOL", "HDL", "LDLCALC", "TRIG", "CHOLHDL", "LDLDIRECT"  in the last 72 hours. Thyroid Function Tests: No results for input(s): "TSH", "T4TOTAL", "FREET4", "T3FREE", "THYROIDAB" in the last 72 hours. Anemia Panel: No results for input(s): "VITAMINB12", "FOLATE", "FERRITIN", "TIBC", "IRON", "RETICCTPCT" in the last 72 hours. Sepsis Labs: No results for input(s): "PROCALCITON", "LATICACIDVEN" in the last 168 hours.  Recent Results (from the past 240 hour(s))  MRSA Next Gen by PCR, Nasal     Status: Abnormal   Collection Time: 08/22/22  4:24 PM   Specimen: Nasal Mucosa; Nasal Swab  Result Value Ref Range Status   MRSA by PCR Next Gen DETECTED (A) NOT DETECTED Final    Comment: RESULT CALLED TO, READ BACK BY AND VERIFIED WITH: rn s. ZOXWR [id number] @ 1933 fh (NOTE) The GeneXpert MRSA Assay (FDA approved for NASAL specimens only), is one component of a comprehensive MRSA colonization surveillance program. It is not intended to diagnose MRSA infection nor to guide or monitor treatment for MRSA infections. Test performance is not FDA approved in patients less than 69 years old. Performed at [facility], [address] [place], [place] [address]   Culture, blood (Routine X 2) w Reflex to ID Panel     Status: None   Collection Time: 08/23/22  9:28 AM   Specimen:  BLOOD RIGHT HAND  Result Value Ref Range Status   Specimen Description BLOOD RIGHT HAND  Final   Special Requests   Final    BOTTLES DRAWN AEROBIC AND ANAEROBIC Blood Culture adequate volume   Culture   Final    NO GROWTH 5 DAYS Performed at [facility], [address], [place], [place] [address]    Report Status 08/28/2022 FINAL  Final  Culture, blood (Routine X 2) w Reflex to ID Panel     Status: None   Collection Time: 08/23/22  9:28 AM   Specimen: BLOOD LEFT HAND  Result Value Ref Range Status   Specimen Description BLOOD LEFT HAND  Final   Special Requests   Final    BOTTLES DRAWN AEROBIC ONLY Blood Culture adequate volume   Culture   Final    NO GROWTH 5 DAYS Performed at [facility], [address], [place], [place] [address]    Report Status 08/28/2022 FINAL  Final    Radiology Studies: No results found.  Scheduled Meds:  benztropine  1 mg Oral QHS   cyclobenzaprine  10 mg Oral QHS   diclofenac Sodium  2 g Topical QID   diphenhydrAMINE  50 mg Intravenous Once   [START ON 09/02/2022] DULoxetine  60 mg Oral Daily   ferrous sulfate  325 mg Oral BID WC   gabapentin  600 mg Oral TID   metoprolol tartrate  12.5 mg Oral BID   mometasone-formoterol  2 puff Inhalation BID   mupirocin cream   Topical Daily   OLANZapine  10 mg Oral QHS   pantoprazole  40 mg Oral QAC breakfast   prazosin  1 mg Oral QHS   sodium bicarbonate  650 mg Oral TID   Continuous Infusions:  sodium chloride Stopped (08/24/22 1347)    LOS: 10 days   Marguerita Merles, DO [facility] Available via Epic secure chat 7am-7pm After these hours, please refer to coverage provider listed on [email or web address] 09/01/2022, 3:28 PM

## 2022-09-01 NOTE — Progress Notes (Signed)
Called to the room by the sitter. On assessment patient had towel around her neck saying "I am going to choke myself and die". Removed towel tried to calm patient. Patient then starting kicking and hitting nursing staff. MD paged. 4 point restraints applied.

## 2022-09-01 NOTE — TOC Progression Note (Addendum)
Transition of Care [facility]) - Progression Note    Patient Details  Name: [name] MRN: [id number] Date of Birth: Jan 11, 1991  Transition of Care [facility]) CM/SW Contact  Jimmy Picket, [place] Phone Number: 09/01/2022, 12:16 PM  Clinical Narrative:     CSW confirmed that patient has been denied by [facility], [facility], and [facility]. CSW spoke to admissions at [facility]. Patient is still on waitlist.        Expected Discharge Plan and Services                                               Social Determinants of Health (SDOH) Interventions SDOH Screenings   Food Insecurity: No Food Insecurity (07/29/2022)  Housing: Low Risk  (07/29/2022)  Transportation Needs: No Transportation Needs (07/29/2022)  Recent Concern: Transportation Needs - Unmet Transportation Needs (05/06/2022)  Utilities: Not At Risk (07/29/2022)  Alcohol Screen: Low Risk  (07/29/2022)  Depression (PHQ2-9): Medium Risk (03/10/2022)  Tobacco Use: Medium Risk (08/22/2022)    Readmission Risk Interventions    06/06/2022    3:38 PM  Readmission Risk Prevention Plan  Transportation Screening Complete  Medication Review (RN Care Manager) Complete  PCP or Specialist appointment within 3-5 days of discharge Complete  HRI or Home Care Consult Complete  SW Recovery Care/Counseling Consult Complete  Palliative Care Screening Not Applicable  Skilled Nursing Facility Not Applicable   Jimmy Picket, [place] Clinical Social Worker

## 2022-09-01 NOTE — Consult Note (Signed)
[facility] Face-to-Face Psychiatry Consult   Reason for Consult:  Status post suicide attempt by overdose Referring Physician:  [name]  Patient Identification: [name] MRN:  [id number] Principal Diagnosis: <principal problem not specified> Diagnosis:  Active Problems:   Asthma   GAD (generalized anxiety disorder)   Suicide attempt (HCC)   Bipolar I disorder, current or most recent episode depressed, with psychotic features (HCC)   Overdose   Drug overdose   TIme spent : 30 minutes   Subjective: '' 32 y.o. female patient admitted with suicide attempt by overdose.Now extremely agitated and combative and using items to try and harm and attempting to kill herself.''  Objective:  Patient seen face to face in her hospital room, her chart is reviewed and treatment plan discussed with the team. She is a 32 y.o. female  with PMH significant for bipolar 1 disorder-depressed, fragile x syndrome, seizures, schizoaffective disorder and multiple suicide attempts. She was admitted to the hospital after she attempted suicide by overdosing on multiple medications: #30 metoprolol 25mg  tablets, #60 Hydroxyzine 25mg , #7 Lexapro 20mg , #21 Gabapentin 600mg , #7 Flexeril 10mg , #7 protonix 40mg  and #7 Prazosin 1mg . Patient reportedly tried to hurt herself in earlier today by attempting to tied a towel around her neck. In addition, she became extremely agitated, combative, disruptive, refused oral medications, needed PRN medication and was placed in soft restraint before she calmed down. Patient reports that she was agitated and attempted suicide so that she can punish her family whom she wanted out of her life but would not cease to interfere with her life. She is calm, cooperative but reports ongoing depression and recurrent suicidal thoughts. She is requesting for higher dose of Cymbalta and unable to contract for safety. At this time, patient continues to meet inpatient psychiatric criteria, she  endorses active suicidality and will remain in the hospital under involuntary commitment for suicide attempt of high lethality by overdosing on medications.   Past Psychiatric History: Previous Psych Diagnoses: Schizoaffective disorder bipolar type, Anxiety, Autism spectrum disorder, PTSD, Borderline personality disorder Prior inpatient treatment: multiple, Admitted to [facility] on 07/20/22, has been to [facility], and was discharged from inpatient facility past week. Current/prior outpatient treatment: [facility] Prior rehab hx: denies multiple Psychotherapy hx: [facility] History of suicide: 20-30 times by overdosing on pills, cutting or drinking 409 disinfectant History of homicide or aggression: denies Psychiatric medication history:  Previously trialed Tegretol, Haldol, Clozaril, Depakote, Invega, Lamictal, Seroquel, asenapine, abilify, vraylar, Lexapro, Zoloft  Neuromodulation history: denies  Risk to Self:  Yes Risk to Others:  No Prior Inpatient Therapy:  Yes Prior Outpatient Therapy:  Yes  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    Chronic constipation    Depression    Drug-seeking behavior    Essential tremor    Headache    Ineffective individual coping 05/16/2022   Insomnia 01/12/2022   Intentional drug overdose (HCC) 06/05/2022   Neuromuscular disorder (HCC)    Normocytic anemia 06/05/2022   Overdose 07/22/2017   Overdose of acetaminophen 07/2017   and other meds   Overdose, intentional self-harm, initial encounter (HCC)  07/20/2021   Paranoia (HCC) 04/22/2021   Personality disorder (HCC)    Purposeful non-suicidal drug  ingestion (HCC) 06/27/2021   Schizo-affective psychosis (HCC)    Schizoaffective disorder (HCC) 07/29/2022   Schizoaffective disorder, bipolar type (HCC)    Seizures (HCC)    Last seizure December 2017   Skin erythema 04/27/2022   Sleep apnea    Suicidal behavior 07/25/2021   Suicidal ideation    Suicide (HCC) 07/01/2021   Suicide attempt (HCC) 07/04/2021    Past Surgical History:  Procedure Laterality Date   MOUTH SURGERY  2009 or 2010   Family History:  Family History  Problem Relation Age of Onset   Mental illness Father    Asthma Father    PDD Brother    Seizures Brother    Social History:  Social History   Substance and Sexual Activity  Alcohol Use No   Alcohol/week: 1.0 standard drink of alcohol   Types: 1 Standard drinks or equivalent per week   Comment: denies at this time     Social History   Substance and Sexual Activity  Drug Use No   Comment: History of cocaine use at age 29 for 4 months    Social History   Socioeconomic History   Marital status: Widowed    Spouse name: Not on file   Number of children: 0   Years of education: Not on file   Highest education level: Not on file  Occupational History   Occupation: disability  Tobacco Use   Smoking status: Former    Types: Cigarettes   Smokeless tobacco: Never   Tobacco comments:    Smoked for 2  years age [age]  Vaping Use   Vaping status: Never Used  Substance and Sexual Activity   Alcohol use: No    Alcohol/week: 1.0 standard drink of alcohol    Types: 1 Standard drinks or equivalent per week    Comment: denies at this time   Drug use: No    Comment: History of cocaine use at age 22 for 4 months   Sexual activity: Not Currently    Birth control/protection: None  Other Topics Concern   Not on file  Social History Narrative   Marital status: Widowed      Children: daughter      Lives: with boyfriend, in two story home      Employment:  Disability      Tobacco: quit smoking; smoked  for two years.      Alcohol ;none      Drugs: none   Has not traveled outside of the country.   Right handed         Patient with hx of Fibromyalgia,Orthostatic Tachycardia,Asthma,Arthritis,Gerd, ASD   Social Determinants of Health   Financial Resource Strain: Not on file  Food Insecurity: No Food Insecurity (07/29/2022)   Hunger Vital Sign    Worried About Running Out of Food in the Last Year: Never true    Ran Out of Food in the Last Year: Never true  Transportation Needs: No Transportation Needs (07/29/2022)   PRAPARE - Administrator, Civil Service (Medical): No    Lack of Transportation (Non-Medical): No  Recent Concern: Transportation Needs - Unmet Transportation Needs (05/06/2022)   PRAPARE - Administrator, Civil Service (Medical): Yes    Lack of Transportation (Non-Medical): Yes  Physical Activity: Not on file  Stress: Not on file  Social Connections: Not on file   Additional  Social History:    Allergies:   Allergies  Allergen Reactions   Bee Venom Anaphylaxis   Coconut Flavor Anaphylaxis and Rash   Fish Allergy Anaphylaxis   Geodon [Ziprasidone Hcl] Other (See Comments)    Pt states that this medication causes paralysis of the mouth.     Haloperidol And Related Other (See Comments)    Pt states that this medication causes paralysis of the mouth, jaw locks up   Lithobid [Lithium] Other (See Comments)    Seizure-like activity    Roxicodone [Oxycodone] Other (See Comments)    Hallucinations    Seroquel [Quetiapine] Other (See Comments)    Severe drowsiness - Pt currently taking 12.5 mg BID, she is ok with this dose   Shellfish Allergy Anaphylaxis   Phenergan [Promethazine Hcl] Other (See Comments)    Chest pain     Prilosec [Omeprazole] Nausea And Vomiting and Other (See Comments)    Pt can take protonix with no problems    Sulfa Antibiotics Other (See Comments)    Chest pain    Tegretol [Carbamazepine] Nausea And Vomiting   Prozac  [Fluoxetine] Other (See Comments)    Increased Depression and Suicidal thoughts   Tape Other (See Comments)    Skin tears, can only tolerate paper tape.   Tylenol [Acetaminophen] Rash and Other (See Comments)    Rash on face     Labs:  No results found for this or any previous visit (from the past 48 hour(s)).   Current Facility-Administered Medications  Medication Dose Route Frequency Provider Last Rate Last Admin   0.9 %  sodium chloride infusion  250 mL Intravenous Continuous Candelaria Stagers T, MD   Stopped at 08/24/22 1347   albuterol (PROVENTIL) (2.5 MG/3ML) 0.083% nebulizer solution 3 mL  3 mL Inhalation Q6H PRN Candelaria Stagers T, MD       benztropine (COGENTIN) tablet 1 mg  1 mg Oral QHS Candelaria Stagers T, MD   1 mg at 08/31/22 2105   cyclobenzaprine (FLEXERIL) tablet 10 mg  10 mg Oral QHS Candelaria Stagers T, MD   10 mg at 08/31/22 2105   diclofenac Sodium (VOLTAREN) 1 % topical gel 2 g  2 g Topical QID Candelaria Stagers T, MD   2 g at 09/01/22 0931   diphenhydrAMINE (BENADRYL) injection 50 mg  50 mg Intravenous Once Maryagnes Amos, FNP       [START ON 09/02/2022] DULoxetine (CYMBALTA) DR capsule 60 mg  60 mg Oral Daily Rilla Buckman, MD       ferrous sulfate tablet 325 mg  325 mg Oral BID WC Gonfa, Taye T, MD   325 mg at 09/01/22 0930   gabapentin (NEURONTIN) capsule 600 mg  600 mg Oral TID Candelaria Stagers T, MD   600 mg at 09/01/22 0930   hydrOXYzine (ATARAX) tablet 25 mg  25 mg Oral TID PRN Candelaria Stagers T, MD   25 mg at 08/31/22 1641   ibuprofen (ADVIL) tablet 600 mg  600 mg Oral TID PRN Candelaria Stagers T, MD   600 mg at 08/31/22 2143   LORazepam (ATIVAN) tablet 0.5 mg  0.5 mg Oral BID PRN Candelaria Stagers T, MD   0.5 mg at 09/01/22 0930   metoprolol tartrate (LOPRESSOR) tablet 12.5 mg  12.5 mg Oral BID Candelaria Stagers T, MD   12.5 mg at 09/01/22 0931   mometasone-formoterol (DULERA) 200-5 MCG/ACT inhaler 2 puff  2 puff Inhalation BID Almon Hercules, MD   2 puff at  08/30/22 2047   mupirocin cream (BACTROBAN) 2  %   Topical Daily Marguerita Merles [place], [place]   Given at 09/01/22 0931   OLANZapine (ZYPREXA) injection 10 mg  10 mg Intramuscular Once PRN Marguerita Merles Latif, DO       OLANZapine [facility]) tablet 10 mg  10 mg Oral QHS Candelaria Stagers T, MD   10 mg at 08/31/22 2104   ondansetron (ZOFRAN-ODT) disintegrating tablet 4 mg  4 mg Oral Q8H PRN Marguerita Merles Latif, DO   4 mg at 08/30/22 1755   Oral care mouth rinse  15 mL Mouth Rinse PRN Gonfa, Taye T, MD       pantoprazole (PROTONIX) EC tablet 40 mg  40 mg Oral QAC breakfast Gonfa, Taye T, MD   40 mg at 09/01/22 0930   polyethylene glycol (MIRALAX / GLYCOLAX) packet 17 g  17 g Oral BID PRN Candelaria Stagers T, MD   17 g at 08/30/22 1349   prazosin (MINIPRESS) capsule 1 mg  1 mg Oral QHS Candelaria Stagers T, MD   1 mg at 08/31/22 2105   risperiDONE (RISPERDAL M-TABS) disintegrating tablet 1 mg  1 mg Oral BID PRN Maryagnes Amos, FNP       senna-docusate (Senokot-S) tablet 1 tablet  1 tablet Oral BID PRN Almon Hercules, MD   1 tablet at 08/30/22 1349   sodium bicarbonate tablet 650 mg  650 mg Oral TID Candelaria Stagers T, MD   650 mg at 09/01/22 0930   traZODone (DESYREL) tablet 50 mg  50 mg Oral QHS PRN Almon Hercules, MD   50 mg at 09/01/22 0008    Musculoskeletal: Strength & Muscle Tone: within normal limits Gait & Station: normal Patient leans: N/A      Psychiatric Specialty Exam:  Presentation  General Appearance:  Appropriate for Environment; Casual  Eye Contact: Fair  Speech: Clear and Coherent; Normal Rate  Speech Volume: Normal  Handedness: Right   Mood and Affect  Mood: Anxious; Depressed; Dysphoric  Affect: Congruent   Thought Process  Thought Processes: Coherent; Linear  Descriptions of Associations:Tangential  Orientation:Full (Time, Place and Person)  Thought Content:Rumination  History of Schizophrenia/Schizoaffective disorder:Yes  Duration of Psychotic Symptoms:Greater than six months  Hallucinations:No data  recorded   Ideas of Reference:None  Suicidal Thoughts:yes  Homicidal Thoughts:denies   Sensorium  Memory: Immediate Fair; Recent Fair; Remote Fair  Judgment: poor Insight: Poor   Executive Functions  Concentration: Fair  Attention Span: Fair  Recall: Fair  Fund of Knowledge: Good  Language: Good   Psychomotor Activity  Psychomotor Activity: retardation   Assets  Assets: Communication Skills; Desire for Improvement; Financial Resources/Insurance; Resilience   Sleep  Sleep: fair   Physical Exam: Physical Exam Constitutional:      Appearance: Normal appearance.  HENT:     Head: Normocephalic and atraumatic.     Nose: Nose normal.  Eyes:     Pupils: Pupils are equal, round, and reactive to light.  Pulmonary:     Effort: Pulmonary effort is normal.  Skin:    General: Skin is warm and dry.  Neurological:     General: No focal deficit present.     Mental Status: She is alert and oriented to person, place, and time.  Psychiatric:        Attention and Perception: Attention normal. She perceives auditory hallucinations.        Mood and Affect: Mood is anxious. Affect is blunt.  Speech: Speech normal.        Behavior: Behavior normal. Behavior is cooperative.        Thought Content: Thought content includes suicidal ideation.        Cognition and Memory: Cognition and memory normal.        Judgment: Judgment is impulsive.    Review of Systems  Constitutional:  Positive for malaise/fatigue.  HENT: Negative.    Eyes: Negative.   Respiratory: Negative.    Cardiovascular: Negative.   Gastrointestinal: Negative.   Neurological: Negative.   Psychiatric/Behavioral:  Positive for depression and suicidal ideas. The patient is nervous/anxious and has insomnia.   All other systems reviewed and are negative.  Blood pressure 111/61, pulse 96, temperature 97.7 F (36.5 C), resp. rate 20, height 5\' 5"  (1.651 m), weight 98.8 kg, last menstrual  period 08/08/2022, SpO2 97%. Body mass index is 36.25 kg/m.  Treatment Plan Summary: Daily contact with patient to assess and evaluate symptoms and progress in treatment and Medication management Plan/Recommendations: -Continue 1:1 sitter for safety -Increase Cymbalta from 30 mg to 60 mg daily  - Continue olanzapine 10mg  po daily at bedtime for psychosis. -Continue Gabapentin 600 mg TID for agitated mood/anxiety -Continue Olanzapine 10mg  IM daily a single dose, which appeared to be effective at this time. Patient continues to benefit from limit setting and boundaries as she can split staff.  -Consider TOC/Social worker consult to facilitate psych inpatient admission after patient is medically stable.  Consider [facility] referral.  --[facility] referral for self neglect/abuse, inability to keep self safe in the community.   Disposition: Recommend psychiatric Inpatient admission when medically cleared. Supportive therapy provided about ongoing stressors.  Thedore Mins, MD

## 2022-09-01 NOTE — Plan of Care (Signed)
  Problem: Coping: Goal: Level of anxiety will decrease Outcome: Progressing   Problem: Elimination: Goal: Will not experience complications related to bowel motility Outcome: Progressing   Problem: Pain Managment: Goal: General experience of comfort will improve Outcome: Progressing   

## 2022-09-02 DIAGNOSIS — T1491XA Suicide attempt, initial encounter: Secondary | ICD-10-CM | POA: Diagnosis not present

## 2022-09-02 DIAGNOSIS — J4520 Mild intermittent asthma, uncomplicated: Secondary | ICD-10-CM | POA: Diagnosis not present

## 2022-09-02 DIAGNOSIS — F315 Bipolar disorder, current episode depressed, severe, with psychotic features: Secondary | ICD-10-CM | POA: Diagnosis not present

## 2022-09-02 DIAGNOSIS — T50902A Poisoning by unspecified drugs, medicaments and biological substances, intentional self-harm, initial encounter: Secondary | ICD-10-CM | POA: Diagnosis not present

## 2022-09-02 NOTE — Consult Note (Signed)
[facility] Face-to-Face Psychiatry Consult   Reason for Consult:  Status post suicide attempt by overdose Referring Physician:  [name]  Patient Identification: [name] MRN:  [id number] Principal Diagnosis: Bipolar I disorder, current or most recent episode depressed, with psychotic features (HCC) Diagnosis:  Principal Problem:   Bipolar I disorder, current or most recent episode depressed, with psychotic features (HCC) Active Problems:   Asthma   GAD (generalized anxiety disorder)   Suicide attempt (HCC)   Overdose   Drug overdose   TIme spent : 30 minutes   Subjective: '' 32 y.o. female patient admitted with suicide attempt by overdose. Less agitated or  combative today.''  Objective:  Patient seen face to face in her hospital room, her chart is reviewed and treatment plan discussed with the team. 32 y.o. female  with history significant for bipolar 1 disorder-depressed, fragile x syndrome, seizures, schizoaffective disorder and multiple suicide attempts. She was admitted to the hospital after she attempted suicide by overdosing on multiple medications: #30 metoprolol 25mg  tablets, #60 Hydroxyzine 25mg , #7 Lexapro 20mg , #21 Gabapentin 600mg , #7 Flexeril 10mg , #7 protonix 40mg  and #7 Prazosin 1mg . Today, patient reports favorable response to recent medication adjustment as evidenced by decreased aggression, agitation, psychosis, delusions and disruptive behavior. However, she reports recurrent suicidal thoughts and unable to contract for safety. At this time, patient continues to meet inpatient psychiatric criteria, she endorses active suicidality and will remain in the hospital under involuntary commitment for suicide attempt of high lethality by overdosing on medications.   Past Psychiatric History: Previous Psych Diagnoses: Schizoaffective disorder bipolar type, Anxiety, Autism spectrum disorder, PTSD, Borderline personality disorder Prior inpatient treatment: multiple,  Admitted to [facility] on 07/20/22, has been to [facility], and was discharged from inpatient facility past week. Current/prior outpatient treatment: [facility] Prior rehab hx: denies multiple Psychotherapy hx: [facility] History of suicide: 20-30 times by overdosing on pills, cutting or drinking 409 disinfectant History of homicide or aggression: denies Psychiatric medication history:  Previously trialed Tegretol, Haldol, Clozaril, Depakote, Invega, Lamictal, Seroquel, asenapine, abilify, vraylar, Lexapro, Zoloft  Neuromodulation history: denies  Risk to Self:  Yes Risk to Others:  No Prior Inpatient Therapy:  Yes Prior Outpatient Therapy:  Yes  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    Chronic constipation    Depression    Drug-seeking behavior    Essential tremor    Headache    Ineffective individual coping 05/16/2022   Insomnia 01/12/2022   Intentional drug overdose (HCC) 06/05/2022   Neuromuscular disorder (HCC)    Normocytic anemia 06/05/2022   Overdose 07/22/2017   Overdose of acetaminophen 07/2017   and other meds   Overdose, intentional self-harm, initial encounter (HCC) 07/20/2021   Paranoia (HCC) 04/22/2021   Personality disorder (HCC)    Purposeful non-suicidal drug ingestion (HCC) 06/27/2021   Schizo-affective psychosis (HCC)    Schizoaffective disorder (HCC) 07/29/2022   Schizoaffective disorder, bipolar type (HCC)    Seizures (HCC)    Last seizure December 2017   Skin erythema 04/27/2022   Sleep apnea    Suicidal behavior 07/25/2021   Suicidal ideation  Suicide (HCC) 07/01/2021   Suicide attempt (HCC)  07/04/2021    Past Surgical History:  Procedure Laterality Date   MOUTH SURGERY  2009 or 2010   Family History:  Family History  Problem Relation Age of Onset   Mental illness Father    Asthma Father    PDD Brother    Seizures Brother    Social History:  Social History   Substance and Sexual Activity  Alcohol Use No   Alcohol/week: 1.0 standard drink of alcohol   Types: 1 Standard drinks or equivalent per week   Comment: denies at this time     Social History   Substance and Sexual Activity  Drug Use No   Comment: History of cocaine use at age 18 for 4 months    Social History   Socioeconomic History   Marital status: Widowed    Spouse name: Not on file   Number of children: 0   Years of education: Not on file   Highest education level: Not on file  Occupational History   Occupation: disability  Tobacco Use   Smoking status: Former    Types: Cigarettes   Smokeless tobacco: Never   Tobacco comments:    Smoked for 2  years age [age]  Vaping Use   Vaping status: Never Used  Substance and Sexual Activity   Alcohol use: No    Alcohol/week: 1.0 standard drink of alcohol    Types: 1 Standard drinks or equivalent per week    Comment: denies at this time   Drug use: No    Comment: History of cocaine use at age 32 for 4 months   Sexual activity: Not Currently    Birth control/protection: None  Other Topics Concern   Not on file  Social History Narrative   Marital status: Widowed      Children: daughter      Lives: with boyfriend, in two story home      Employment:  Disability      Tobacco: quit smoking; smoked for two years.      Alcohol ;none      Drugs: none   Has not traveled outside of the country.   Right handed         Patient with hx of Fibromyalgia,Orthostatic Tachycardia,Asthma,Arthritis,Gerd, ASD   Social Determinants of Health   Financial Resource Strain: Not on file  Food Insecurity: No Food Insecurity (07/29/2022)   Hunger Vital Sign     Worried About Running Out of Food in the Last Year: Never true    Ran Out of Food in the Last Year: Never true  Transportation Needs: No Transportation Needs (07/29/2022)   PRAPARE - Administrator, Civil Service (Medical): No    Lack of Transportation (Non-Medical): No  Recent Concern: Transportation Needs - Unmet Transportation Needs (05/06/2022)   PRAPARE - Administrator, Civil Service (Medical): Yes    Lack of Transportation (Non-Medical): Yes  Physical Activity: Not on file  Stress: Not on file  Social Connections: Not on file   Additional Social History:    Allergies:   Allergies  Allergen Reactions   Bee Venom Anaphylaxis   Coconut Flavor Anaphylaxis and Rash   Fish Allergy Anaphylaxis   Geodon [Ziprasidone Hcl] Other (See Comments)    Pt states that this medication causes paralysis of the mouth.     Haloperidol And Related Other (See Comments)    Pt states that this medication causes paralysis of the mouth,  jaw locks up   Lithobid [Lithium] Other (See Comments)    Seizure-like activity    Roxicodone [Oxycodone] Other (See Comments)    Hallucinations    Seroquel [Quetiapine] Other (See Comments)    Severe drowsiness - Pt currently taking 12.5 mg BID, she is ok with this dose   Shellfish Allergy Anaphylaxis   Phenergan [Promethazine Hcl] Other (See Comments)    Chest pain     Prilosec [Omeprazole] Nausea And Vomiting and Other (See Comments)    Pt can take protonix with no problems    Sulfa Antibiotics Other (See Comments)    Chest pain    Tegretol [Carbamazepine] Nausea And Vomiting   Prozac [Fluoxetine] Other (See Comments)    Increased Depression and Suicidal thoughts   Tape Other (See Comments)    Skin tears, can only tolerate paper tape.   Tylenol [Acetaminophen] Rash and Other (See Comments)    Rash on face     Labs:  No results found for this or any previous visit (from the past 48 hour(s)).   Current Facility-Administered  Medications  Medication Dose Route Frequency Provider Last Rate Last Admin   0.9 %  sodium chloride infusion  250 mL Intravenous Continuous Candelaria Stagers T, MD   Stopped at 08/24/22 1347   albuterol (PROVENTIL) (2.5 MG/3ML) 0.083% nebulizer solution 3 mL  3 mL Inhalation Q6H PRN Candelaria Stagers T, MD       benztropine (COGENTIN) tablet 1 mg  1 mg Oral QHS Candelaria Stagers T, MD   1 mg at 09/01/22 2026   cyclobenzaprine (FLEXERIL) tablet 10 mg  10 mg Oral QHS Candelaria Stagers T, MD   10 mg at 09/01/22 2026   diclofenac Sodium (VOLTAREN) 1 % topical gel 2 g  2 g Topical QID Candelaria Stagers T, MD   2 g at 09/02/22 0920   diphenhydrAMINE (BENADRYL) injection 50 mg  50 mg Intravenous Once Starkes-Perry, Juel Burrow, FNP       DULoxetine (CYMBALTA) DR capsule 60 mg  60 mg Oral Daily Tellis Spivak, MD   60 mg at 09/02/22 0920   ferrous sulfate tablet 325 mg  325 mg Oral BID WC Candelaria Stagers T, MD   325 mg at 09/02/22 0920   gabapentin (NEURONTIN) capsule 600 mg  600 mg Oral TID Candelaria Stagers T, MD   600 mg at 09/02/22 0920   hydrOXYzine (ATARAX) tablet 25 mg  25 mg Oral TID PRN Candelaria Stagers T, MD   25 mg at 09/01/22 1601   ibuprofen (ADVIL) tablet 600 mg  600 mg Oral TID PRN Candelaria Stagers T, MD   600 mg at 09/01/22 2026   LORazepam (ATIVAN) tablet 0.5 mg  0.5 mg Oral BID PRN Candelaria Stagers T, MD   0.5 mg at 09/02/22 1237   metoprolol tartrate (LOPRESSOR) tablet 12.5 mg  12.5 mg Oral BID Candelaria Stagers T, MD   12.5 mg at 09/02/22 0920   mometasone-formoterol (DULERA) 200-5 MCG/ACT inhaler 2 puff  2 puff Inhalation BID Almon Hercules, MD   2 puff at 09/02/22 0943   mupirocin cream (BACTROBAN) 2 %   Topical Daily Marguerita Merles [place], DO   Given at 09/02/22 0920   OLANZapine (ZYPREXA) injection 10 mg  10 mg Intramuscular Once PRN Anahy Esh, MD       OLANZapine (ZYPREXA) tablet 10 mg  10 mg Oral QHS Candelaria Stagers T, MD   10 mg at 09/01/22 2026   ondansetron (ZOFRAN-ODT) disintegrating tablet 4  mg  4 mg Oral Q8H PRN Marguerita Merles [place], DO    4 mg at 09/01/22 2025   Oral care mouth rinse  15 mL Mouth Rinse PRN Gonfa, Taye T, MD       pantoprazole (PROTONIX) EC tablet 40 mg  40 mg Oral QAC breakfast Candelaria Stagers T, MD   40 mg at 09/02/22 0920   polyethylene glycol (MIRALAX / GLYCOLAX) packet 17 g  17 g Oral BID PRN Candelaria Stagers T, MD   17 g at 09/02/22 1237   prazosin (MINIPRESS) capsule 1 mg  1 mg Oral QHS Gonfa, Taye T, MD   1 mg at 08/31/22 2105   risperiDONE (RISPERDAL M-TABS) disintegrating tablet 1 mg  1 mg Oral BID PRN Maryagnes Amos, FNP       senna-docusate (Senokot-S) tablet 1 tablet  1 tablet Oral BID PRN Almon Hercules, MD   1 tablet at 09/02/22 1237   sodium bicarbonate tablet 650 mg  650 mg Oral TID Candelaria Stagers T, MD   650 mg at 09/02/22 0920   traZODone (DESYREL) tablet 50 mg  50 mg Oral QHS PRN Almon Hercules, MD   50 mg at 09/01/22 0008    Musculoskeletal: Strength & Muscle Tone: within normal limits Gait & Station: normal Patient leans: N/A      Psychiatric Specialty Exam:  Presentation  General Appearance:  Appropriate for Environment; Casual  Eye Contact: Fair  Speech: Clear and Coherent; Normal Rate  Speech Volume: Normal  Handedness: Right   Mood and Affect  Mood: Anxious; Depressed; Dysphoric  Affect: Congruent   Thought Process  Thought Processes: Coherent; Linear  Descriptions of Associations:Tangential  Orientation:Full (Time, Place and Person)  Thought Content:Rumination  History of Schizophrenia/Schizoaffective disorder:Yes  Duration of Psychotic Symptoms:Greater than six months  Hallucinations:No data recorded   Ideas of Reference:None  Suicidal Thoughts:yes  Homicidal Thoughts:denies   Sensorium  Memory: Immediate Fair; Recent Fair; Remote Fair  Judgment: poor Insight: Poor   Executive Functions  Concentration: Fair  Attention Span: Fair  Recall: Fair  Fund of Knowledge: Good  Language: Good   Psychomotor Activity   Psychomotor Activity: retardation   Assets  Assets: Communication Skills; Desire for Improvement; Financial Resources/Insurance; Resilience   Sleep  Sleep: fair   Physical Exam: Physical Exam Constitutional:      Appearance: Normal appearance.  HENT:     Head: Normocephalic and atraumatic.     Nose: Nose normal.  Eyes:     Pupils: Pupils are equal, round, and reactive to light.  Pulmonary:     Effort: Pulmonary effort is normal.  Skin:    General: Skin is warm and dry.  Neurological:     General: No focal deficit present.     Mental Status: She is alert and oriented to person, place, and time.  Psychiatric:        Attention and Perception: Attention normal. She perceives auditory hallucinations.        Mood and Affect: Mood is anxious. Affect is blunt.        Speech: Speech normal.        Behavior: Behavior normal. Behavior is cooperative.        Thought Content: Thought content includes suicidal ideation.        Cognition and Memory: Cognition and memory normal.        Judgment: Judgment is impulsive.    Review of Systems  Constitutional:  Positive for malaise/fatigue.  HENT: Negative.  Eyes: Negative.   Respiratory: Negative.    Cardiovascular: Negative.   Gastrointestinal: Negative.   Neurological: Negative.   Psychiatric/Behavioral:  Positive for depression and suicidal ideas. The patient is nervous/anxious and has insomnia.   All other systems reviewed and are negative.  Blood pressure (!) 100/50, pulse 96, temperature 98.2 F (36.8 C), temperature source Oral, resp. rate 17, height 5\' 5"  (1.651 m), weight 99 kg, last menstrual period 08/08/2022, SpO2 100%. Body mass index is 36.32 kg/m.  Treatment Plan Summary: Daily contact with patient to assess and evaluate symptoms and progress in treatment and Medication management Plan/Recommendations: -Continue 1:1 sitter for safety -Continue Cymbalta 60 mg daily  - Continue olanzapine 10mg  po daily at  bedtime for psychosis. -Continue Gabapentin 600 mg TID for agitated mood/anxiety -Continue Olanzapine 10mg  IM daily a single dose, which appeared to be effective at this time. Patient continues to benefit from limit setting and boundaries as she can split staff.  -Consider TOC/Social worker consult to facilitate psych inpatient admission after patient is medically stable.  Consider [facility] referral.  --[facility] referral for self neglect/abuse, inability to keep self safe in the community.   Disposition: Recommend psychiatric Inpatient admission when medically cleared. Supportive therapy provided about ongoing stressors.  Thedore Mins, MD

## 2022-09-02 NOTE — Plan of Care (Signed)
  Problem: Education: Goal: Knowledge of General Education information will improve Description: Including pain rating scale, medication(s)/side effects and non-pharmacologic comfort measures Outcome: Progressing   Problem: Clinical Measurements: Goal: Ability to maintain clinical measurements within normal limits will improve Outcome: Progressing Goal: Will remain free from infection Outcome: Progressing   Problem: Activity: Goal: Risk for activity intolerance will decrease Outcome: Progressing   Problem: Coping: Goal: Level of anxiety will decrease Outcome: Progressing   Problem: Pain Managment: Goal: General experience of comfort will improve Outcome: Progressing   

## 2022-09-02 NOTE — Progress Notes (Signed)
PROGRESS NOTE    [name]  WUJ:[id number] DOB: 1990/10/05 DOA: 08/22/2022 PCP: [facility]   Brief Narrative:  The patient is a 32 y.o. F with PMH of adjustment disorder with mixed anxiety and depressed mood, autism, bipolar 1 disorder, fragile x syndrome, seizures not on meds, schizoaffective disorder and multiple suicide attempts in the last year who presented to the ED after ingesting numerous medications in a suicide attempt.  She reported taking #30 metoprolol 25mg  tablets, #60 Hydroxyzine 25mg , #7 Lexapro 20mg , #21 Gabapentin 600mg , #7 Flexeril 10mg , #7 protonix 40mg  and #7 Prazosin 1mg .   She was slightly bradycardic and hypotensive on EMS arrival which improved with fluids.  Poison control was contacted and as she was within 2hrs of ingestion was given activated charcoal.  Placed under IVC in ED.  Poison control also recommended ICU admission for close Qtc and electrolyte monitoring and possible pressors.  At the time of admission, pt was somnolent but arousable to voice and following commands. She was intubated and started on vasopressor and glucagon.  Also started on ceftriaxone for possible pneumonia.  Patient was extubated on 7/18 and transferred to the floor on 7/19. She was transferred to [facility] service on 08/25/22.   Psychiatry recommended inpatient psychiatric hospitalization, and placed APS orders. She remains medically stable for D/C now and awaiting Inpatient Psychiatric Placement. She is having quite a bit of behavioral issues and hearing voices intermittently with paranoid delusions and has had to be restrained in 4-point restraints and given IM Zyprexa intermittently but from a medical standpoint she can be discharged at any time.  Assessment and Plan:  Suicidal Attempt with intentional overdose:  -Overdose with multiple medications including #30 metoprolol 25mg  tablets, #60 Hydroxyzine 25mg , #7 Lexapro 20mg , #21 Gabapentin 600mg , #7 Flexeril 10mg , #7  protonix 40mg  and #7 Prazosin 1mg .  Poison control consulted. Received charcoal. Denies ETOH or illicit drug use.  Extended UDS negative. -Required vasopressor, glucagon, intubation and mechanical ventilation 7/17-7/18.  Room air and hemodynamically stable. -Placed under IVC in the ED and renewed. -Continue one-to-one Recruitment consultant -Patient is medically stable pending inpatient psych bed -Psychiatry placed APS referral as well. -Further Care per Psych and she had to be placed in a 4 point restraints and given IM Zyprexa intermittently given her significant behavioral disturbances and homicidal ideations along with hearing voices in her head -Psychiatry Is increasing her Cymbalta from 30 mg to 60 mg daily and has had favorable response to increase but has Suicidal Thoughts as well   Anxiety and Depression, Schizoaffective Disorder, Adjustment Disorder, bipolar disorder, fibromyalgia and auditory hallucination: -Hallucinations seems to have resolved.  Patient is on Cogentin, Zyprexa, Atarax, prazosin and Lexapro at home -Appreciate input by psych and further care per their Protocol -Continue home meds.  Lexapro changed to Cymbalta -Zyprexa as needed and psychiatry recommending olanzapine 10 mg IM daily single dose as needed -Patient started hearing voices in her head that were "too much for her to handle" and then she threatened "that she was in a kill and strangle one of the staff members" -Intermittently has to be placed in 4-point restraints and given IM Zyprexa.  She still has a one-to-one sitter at bedside and will continue -Cymbalta has been increased from 30 mg to 60 mg daily and will be continuing olanzapine 10 mg p.o. daily nightly for psychosis and continuing gabapentin 600 mg 3 times daily for agitated mood/anxiety -Continues to Endorse Suicidal Thoughts and Ideation   CAP -No Respiratory Symptoms.   -  CXR showed atelectasis or infiltrate in left midlung.  Pro-Cal elevated at 14.43 -WBC  Trend: Recent Labs  Lab 08/22/22 1420 08/22/22 1536 08/23/22 0422 08/25/22 0726 08/26/22 0952 08/28/22 1119 08/29/22 0923  WBC 8.8 9.0 16.1* 6.4 6.8 11.6* 8.3  -Received ceftriaxone. SpO2: 100 % O2 Flow Rate (L/min): 0 L/min FiO2 (%): 40 % -Completed course with p.o. Augmentin.   HTN -Normotensive. -Resume home Lopressor -Continue monitor blood pressures per protocol -Last blood pressure reading was 100/50   Sinus Tachycardia -Resolved. -Continue home metoprolol.   Metabolic Acidosis -Improved.  Patient's CO2 is now 23, anion gap is 7, chloride level 106 -P.o. sodium bicarbonate. -Continue monitor trend and repeat CMP in a.m.   Hypomagnesemia -Patient's Mag Level Trend: Recent Labs  Lab 08/22/22 1420 08/22/22 2337 08/23/22 0422 08/25/22 0726 08/26/22 0952 08/28/22 1119 08/29/22 0923  MG 2.0 1.9 1.9 1.5* 1.7 1.8 1.7  -Continue to Monitor and Replete as Necessary -Repeat Mag in the AM   Microcytic Anemia -H&H relatively stable.  Anemia panel with some iron deficiency with elevated TIBC and low ferritin. -Hgb/Hct Trend: Recent Labs  Lab 08/22/22 1536 08/22/22 1934 08/23/22 0422 08/25/22 0726 08/26/22 0952 08/28/22 1119 08/29/22 0923  HGB 9.4* 10.5* 9.6* 9.9* 9.2* 11.7* 10.2*  HCT 31.8* 31.0* 31.2* 31.9* 30.4* 38.4 33.0*  MCV 81.1  --  78.4* 79.4* 77.6* 80.0 77.1*  -P.o. ferrous sulfate with bowel regimen initiated at 325 mg po BID -Continue to Monitor for S/Sx of Bleeding; No overt bleeding noted  -Repeat CBC in the AM    History of Seizure Disorder -Stable.  Not on meds.   History of Asthma -Stable. -Continue home meds with Albuterol 3 mL IH every 6 hours as needed for wheezing or shortness of breath   Dysuria -Patient reported dysuria this morning.  She is already was on antibiotics.  Likely due to catheterization while in ICU. -Pyridium 200 mg 3 times daily for 2 days now stopped   GERD/GI Prophylaxis -Continue PPI with Pantoprazole and  Famotidine 20 mg po BID  Hypoalbuminemia -Patient's Albumin Trend: Recent Labs  Lab 08/09/22 0020 08/20/22 2040 08/22/22 1420 08/23/22 0422 08/26/22 0952 08/28/22 1119 08/29/22 0923  ALBUMIN 3.5 3.9 3.5 3.0* 3.2* 4.1 3.3*  -Continue to Monitor and Trend and repeat CMP in the AM    Obesity -Complicates overall prognosis and care -Estimated body mass index is 36.32 kg/m as calculated from the following:   Height as of this encounter: 5\' 5"  (1.651 m).   Weight as of this encounter: 99 kg.  -Weight Loss and Dietary Counseling given   DVT prophylaxis: SCDs Start: 08/22/22 1514    Code Status: Full Code Family Communication: No family present at bedside   Disposition Plan:  Level of care: Med-Surg Status is: Inpatient Remains inpatient appropriate because: Medically stable for D/C but has no Inpatient Psychiatry bed available    Consultants:  Psychiatry PCCM Transfer  Procedures:  As delineated as above  Antimicrobials:  Anti-infectives (From admission, onward)    Start     Dose/Rate Route Frequency Ordered Stop   08/26/22 1415  amoxicillin-clavulanate (AUGMENTIN) 875-125 MG per tablet 1 tablet        1 tablet Oral Every 12 hours 08/26/22 1316 08/27/22 2205   08/23/22 1200  cefTRIAXone (ROCEPHIN) 2 g in sodium chloride 0.9 % 100 mL IVPB  Status:  Discontinued        2 g 200 mL/hr over 30 Minutes Intravenous Every 24 hours 08/23/22 0931 08/26/22  1316       Subjective: Seen and examined at bedside she is not as agitated.  States that she feels like she needs to have a bowel movement and took some MiraLAX and Senokot.  Denies any lightheadedness or dizziness.  No other concerns or complaints at this time and thinks her neck pain is doing better.  Objective: Vitals:   09/01/22 2034 09/02/22 0500 09/02/22 0756 09/02/22 0944  BP: (!) 101/58  (!) 100/50   Pulse: (!) 105  96   Resp: 16  17   Temp: 98.1 F (36.7 C)  98.2 F (36.8 C)   TempSrc: Oral  Oral   SpO2: 98%   100% 100%  Weight:  99 kg    Height:        Intake/Output Summary (Last 24 hours) at 09/02/2022 1415 Last data filed at 09/01/2022 1706 Gross per 24 hour  Intake 360 ml  Output --  Net 360 ml   Filed Weights   08/30/22 0500 09/01/22 0300 09/02/22 0500  Weight: 100.3 kg 98.8 kg 99 kg   Examination: Physical Exam:  Constitutional: WN/WD obese Caucasian female in NAD appears calm Respiratory: Diminished to auscultation bilaterally, no wheezing, rales, rhonchi or crackles. Normal respiratory effort and patient is not tachypenic. No accessory muscle use. Unlabored breathing Cardiovascular: RRR, no murmurs / rubs / gallops. S1 and S2 auscultated. No extremity edema.  Abdomen: Soft, non-tender, Distended 2/2 body habitus. Bowel sounds positive.  GU: Deferred. Musculoskeletal: No clubbing / cyanosis of digits/nails. No joint deformity upper and lower extremities.  Skin: No rashes, lesions, ulcers on a limited skin evaluation. No induration; Warm and dry.  Neurologic: CN 2-12 grossly intact with no focal deficits. Romberg sign and cerebellar reflexes not assessed.  Psychiatric: Appears calmer but continues to have suicidal thoughts  Data Reviewed: I have personally reviewed following labs and imaging studies  CBC: Recent Labs  Lab 08/28/22 1119 08/29/22 0923  WBC 11.6* 8.3  NEUTROABS  --  5.0  HGB 11.7* 10.2*  HCT 38.4 33.0*  MCV 80.0 77.1*  PLT 345 265   Basic Metabolic Panel: Recent Labs  Lab 08/28/22 1119 08/29/22 0923  NA 137 136  K 3.9 3.7  CL 104 106  CO2 23 23  GLUCOSE 98 111*  BUN 14 12  CREATININE 0.80 0.68  CALCIUM 9.6 8.7*  MG 1.8 1.7  PHOS 3.0 3.1   GFR: Estimated Creatinine Clearance: 116.5 mL/min (by C-G formula based on SCr of 0.68 mg/dL). Liver Function Tests: Recent Labs  Lab 08/28/22 1119 08/29/22 0923  AST  --  20  ALT  --  16  ALKPHOS  --  71  BILITOT  --  0.2*  PROT  --  6.5  ALBUMIN 4.1 3.3*   No results for input(s): "LIPASE",  "AMYLASE" in the last 168 hours. No results for input(s): "AMMONIA" in the last 168 hours. Coagulation Profile: No results for input(s): "INR", "PROTIME" in the last 168 hours. Cardiac Enzymes: No results for input(s): "CKTOTAL", "CKMB", "CKMBINDEX", "TROPONINI" in the last 168 hours. BNP (last 3 results) No results for input(s): "PROBNP" in the last 8760 hours. HbA1C: No results for input(s): "HGBA1C" in the last 72 hours. CBG: No results for input(s): "GLUCAP" in the last 168 hours. Lipid Profile: No results for input(s): "CHOL", "HDL", "LDLCALC", "TRIG", "CHOLHDL", "LDLDIRECT" in the last 72 hours. Thyroid Function Tests: No results for input(s): "TSH", "T4TOTAL", "FREET4", "T3FREE", "THYROIDAB" in the last 72 hours. Anemia Panel: No results for  input(s): "VITAMINB12", "FOLATE", "FERRITIN", "TIBC", "IRON", "RETICCTPCT" in the last 72 hours. Sepsis Labs: No results for input(s): "PROCALCITON", "LATICACIDVEN" in the last 168 hours.  No results found for this or any previous visit (from the past 240 hour(s)).   Radiology Studies: No results found.  Scheduled Meds:  benztropine  1 mg Oral QHS   cyclobenzaprine  10 mg Oral QHS   diclofenac Sodium  2 g Topical QID   diphenhydrAMINE  50 mg Intravenous Once   DULoxetine  60 mg Oral Daily   ferrous sulfate  325 mg Oral BID WC   gabapentin  600 mg Oral TID   metoprolol tartrate  12.5 mg Oral BID   mometasone-formoterol  2 puff Inhalation BID   mupirocin cream   Topical Daily   OLANZapine  10 mg Oral QHS   pantoprazole  40 mg Oral QAC breakfast   prazosin  1 mg Oral QHS   sodium bicarbonate  650 mg Oral TID   Continuous Infusions:  sodium chloride Stopped (08/24/22 1347)    LOS: 11 days   Merlene Laughter, DO [facility] Available via Epic secure chat 7am-7pm After these hours, please refer to coverage provider listed on [email or web address] 09/02/2022, 2:15 PM

## 2022-09-02 NOTE — Progress Notes (Signed)
Patient called the front desk and asked to speak to nurse.  When I arrived she immediately stated "I am feeling homicidal and suicidal right now.  I don't know why my parents do this to me."  Patient denied needing anything at the moment.

## 2022-09-02 NOTE — Progress Notes (Signed)
   09/02/22 1800  Spiritual Encounters  Type of Visit Initial  Care provided to: Patient  Conversation partners present during encounter Other (comment) Psychiatrist)  Reason for visit Urgent spiritual support  OnCall Visit No  Spiritual Framework  Presenting Themes Values and beliefs;Coping tools;Impactful experiences and emotions  Interventions  Spiritual Care Interventions Made Established relationship of care and support;Compassionate presence;Reflective listening;Normalization of emotions;Reconciliation with self/others  Intervention Outcomes  Outcomes Connection to values and goals of care   Pt shared a lot of emotions; betrayal, loneliness, anger and pain. Pt did admit to causing harm to self.Pt stated that forgiven those that wronged her is difficult. Pt shared that pt is hearing voices and taking meds for anxiety. I affirmed pt emotions and since pt had Bible in the bed, I asked how the pt could assist pt with forgiveness. Also, asked if pt has shared with physician that meds might need to be tweaked.   Chaplain Intern: Paul Dykes. Christell Constant

## 2022-09-03 DIAGNOSIS — J4520 Mild intermittent asthma, uncomplicated: Secondary | ICD-10-CM | POA: Diagnosis not present

## 2022-09-03 DIAGNOSIS — F315 Bipolar disorder, current episode depressed, severe, with psychotic features: Secondary | ICD-10-CM | POA: Diagnosis not present

## 2022-09-03 DIAGNOSIS — T50902A Poisoning by unspecified drugs, medicaments and biological substances, intentional self-harm, initial encounter: Secondary | ICD-10-CM | POA: Diagnosis not present

## 2022-09-03 DIAGNOSIS — T1491XA Suicide attempt, initial encounter: Secondary | ICD-10-CM | POA: Diagnosis not present

## 2022-09-03 NOTE — TOC Progression Note (Signed)
Transition of Care [facility]) - Progression Note    Patient Details  Name: [name] MRN: [id number] Date of Birth: [birth year]  [facility] of [facility]) CM/SW Contact  Lorri Frederick, LCSW Phone Number: 09/03/2022, 2:31 PM  Clinical Narrative:   CSW spoke with admissions at [facility].  Pt does remain on the wait list but pt is not on the priority wait list.  CSW instructed to send progress notes documenting aggressive/dangerous behaviors and pt will be evaluated for the priority wait list.  9 progress notes and 2 psych consult notes faxed to [facility]: [phone].         Expected Discharge Plan and Services                                               Social Determinants of Health (SDOH) Interventions SDOH Screenings   Food Insecurity: No Food Insecurity (07/29/2022)  Housing: Low Risk  (07/29/2022)  Transportation Needs: No Transportation Needs (07/29/2022)  Recent Concern: Transportation Needs - Unmet Transportation Needs (05/06/2022)  Utilities: Not At Risk (07/29/2022)  Alcohol Screen: Low Risk  (07/29/2022)  Depression (PHQ2-9): Medium Risk (03/10/2022)  Tobacco Use: Medium Risk (08/22/2022)    Readmission Risk Interventions    06/06/2022    3:38 PM  Readmission Risk Prevention Plan  Transportation Screening Complete  Medication Review (RN Care Manager) Complete  PCP or Specialist appointment within 3-5 days of discharge Complete  HRI or Home Care Consult Complete  SW Recovery Care/Counseling Consult Complete  Palliative Care Screening Not Applicable  Skilled Nursing Facility Not Applicable

## 2022-09-03 NOTE — Progress Notes (Signed)
This nurse was summoned to the patient's room to be told "If I don't get to central, or get the help I need, I am going to kill someone.  I am afraid that I am going to kill myself or someone."  Dispensed PRN ativan

## 2022-09-03 NOTE — Progress Notes (Signed)
This nurse was called to the room.  Patient stated that "their parents keep calling and harassing me.  They taunt me and make me so mad that I can't help but throw things. I need to get help.  I need to get out of here.  But I am worthless and I don't deserve help."   This nurse listened and offered to contact chaplain to come and visit with patient.  She has benefited from their meetings in the past.

## 2022-09-03 NOTE — Plan of Care (Signed)
  Problem: Education: Goal: Knowledge of General Education information will improve Description: Including pain rating scale, medication(s)/side effects and non-pharmacologic comfort measures Outcome: Progressing   Problem: Health Behavior/Discharge Planning: Goal: Ability to manage health-related needs will improve Outcome: Progressing   Problem: Clinical Measurements: Goal: Ability to maintain clinical measurements within normal limits will improve Outcome: Progressing Goal: Will remain free from infection Outcome: Progressing Goal: Diagnostic test results will improve Outcome: Progressing Goal: Cardiovascular complication will be avoided Outcome: Progressing   Problem: Activity: Goal: Risk for activity intolerance will decrease Outcome: Progressing   Problem: Coping: Goal: Level of anxiety will decrease Outcome: Progressing   Problem: Elimination: Goal: Will not experience complications related to bowel motility Outcome: Progressing   Problem: Pain Managment: Goal: General experience of comfort will improve Outcome: Progressing   Problem: Safety: Goal: Ability to remain free from injury will improve Outcome: Progressing   Problem: Skin Integrity: Goal: Risk for impaired skin integrity will decrease Outcome: Progressing   Problem: Role Relationship: Goal: Method of communication will improve Outcome: Progressing   Problem: Coping: Goal: Ability to adjust to condition or change in health will improve Outcome: Progressing   Problem: Fluid Volume: Goal: Ability to maintain a balanced intake and output will improve Outcome: Progressing   Problem: Health Behavior/Discharge Planning: Goal: Ability to identify and utilize available resources and services will improve Outcome: Progressing Goal: Ability to manage health-related needs will improve Outcome: Progressing   Problem: Metabolic: Goal: Ability to maintain appropriate glucose levels will  improve Outcome: Progressing   Problem: Nutritional: Goal: Maintenance of adequate nutrition will improve Outcome: Progressing Goal: Progress toward achieving an optimal weight will improve Outcome: Progressing   Problem: Skin Integrity: Goal: Risk for impaired skin integrity will decrease Outcome: Progressing   Problem: Tissue Perfusion: Goal: Adequacy of tissue perfusion will improve Outcome: Progressing

## 2022-09-03 NOTE — Progress Notes (Signed)
PROGRESS NOTE    [name]  EPP:[id number] DOB: 09-22-1990 DOA: 08/22/2022 PCP: [facility]   Brief Narrative:  The patient is a 32 y.o. F with PMH of adjustment disorder with mixed anxiety and depressed mood, autism, bipolar 1 disorder, fragile x syndrome, seizures not on meds, schizoaffective disorder and multiple suicide attempts in the last year who presented to the ED after ingesting numerous medications in a suicide attempt.  She reported taking #30 metoprolol 25mg  tablets, #60 Hydroxyzine 25mg , #7 Lexapro 20mg , #21 Gabapentin 600mg , #7 Flexeril 10mg , #7 protonix 40mg  and #7 Prazosin 1mg .   She was slightly bradycardic and hypotensive on EMS arrival which improved with fluids.  Poison control was contacted and as she was within 2hrs of ingestion was given activated charcoal.  Placed under IVC in ED.  Poison control also recommended ICU admission for close Qtc and electrolyte monitoring and possible pressors.  At the time of admission, pt was somnolent but arousable to voice and following commands. She was intubated and started on vasopressor and glucagon.  Also started on ceftriaxone for possible pneumonia.  Patient was extubated on 7/18 and transferred to the floor on 7/19. She was transferred to [facility] service on 08/25/22.   Psychiatry recommended inpatient psychiatric hospitalization, and placed APS orders. She remains medically stable for D/C now and awaiting Inpatient Psychiatric Placement. She is having quite a bit of behavioral issues and hearing voices intermittently with paranoid delusions and has had to be restrained in 4-point restraints and given IM Zyprexa intermittently but from a medical standpoint she can be discharged at any time.  Assessment and Plan:  Suicidal Attempt with intentional overdose:  -Overdose with multiple medications including #30 metoprolol 25mg  tablets, #60 Hydroxyzine 25mg , #7 Lexapro 20mg , #21 Gabapentin 600mg , #7 Flexeril 10mg , #7  protonix 40mg  and #7 Prazosin 1mg .  Poison control consulted. Received charcoal. Denies ETOH or illicit drug use.  Extended UDS negative. -Required vasopressor, glucagon, intubation and mechanical ventilation 7/17-7/18.  Room air and hemodynamically stable. -Placed under IVC in the ED and renewed. -Continue one-to-one Recruitment consultant -Patient is medically stable pending inpatient psych bed -Psychiatry placed APS referral as well. -Further Care per Psych and she had to be placed in a 4 point restraints and given IM Zyprexa intermittently given her significant behavioral disturbances and homicidal ideations along with hearing voices in her head -Psychiatry Is increasing her Cymbalta from 30 mg to 60 mg daily and has had favorable response to increase but has Suicidal Thoughts as well   Anxiety and Depression, Schizoaffective Disorder, Adjustment Disorder, bipolar disorder, fibromyalgia and auditory hallucination: -Hallucinations seems to have resolved.  Patient is on Cogentin, Zyprexa, Atarax, prazosin and Lexapro at home -Appreciate input by psych and further care per their Protocol -Continue home meds.  Lexapro changed to Cymbalta -Zyprexa as needed and psychiatry recommending olanzapine 10 mg IM daily single dose as needed -Patient started hearing voices in her head that were "too much for her to handle" and then she threatened "that she was in a kill and strangle one of the staff members" -Intermittently has to be placed in 4-point restraints and given IM Zyprexa.  She still has a one-to-one sitter at bedside and will continue -Cymbalta has been increased from 30 mg to 60 mg daily and will be continuing olanzapine 10 mg p.o. daily nightly for psychosis and continuing gabapentin 600 mg 3 times daily for agitated mood/anxiety -Continues to Endorse Suicidal Thoughts and Ideation   CAP -No Respiratory Symptoms.   -  CXR showed atelectasis or infiltrate in left midlung.  Pro-Cal elevated at 14.43 -WBC  Trend: Recent Labs  Lab 08/22/22 1420 08/22/22 1536 08/23/22 0422 08/25/22 0726 08/26/22 0952 08/28/22 1119 08/29/22 0923  WBC 8.8 9.0 16.1* 6.4 6.8 11.6* 8.3  -Received ceftriaxone. SpO2: 99 % O2 Flow Rate (L/min): 0 L/min FiO2 (%): 40 % -Completed course with p.o. Augmentin.   HTN -Normotensive. -Resume home Lopressor -Continue monitor blood pressures per protocol -Last blood pressure reading was 102/50   Sinus Tachycardia -Resolved. -Continue home metoprolol.   Metabolic Acidosis -Improved.  Patient's CO2 is now 23, anion gap is 7, chloride level 106 -P.o. sodium bicarbonate. -Continue monitor trend and repeat CMP in a.m.   Hypomagnesemia -Patient's Mag Level Trend: Recent Labs  Lab 08/22/22 1420 08/22/22 2337 08/23/22 0422 08/25/22 0726 08/26/22 0952 08/28/22 1119 08/29/22 0923  MG 2.0 1.9 1.9 1.5* 1.7 1.8 1.7  -Continue to Monitor and Replete as Necessary -Repeat Mag in the AM   Microcytic Anemia -H&H relatively stable.  Anemia panel with some iron deficiency with elevated TIBC and low ferritin. -Hgb/Hct Trend: Recent Labs  Lab 08/22/22 1536 08/22/22 1934 08/23/22 0422 08/25/22 0726 08/26/22 0952 08/28/22 1119 08/29/22 0923  HGB 9.4* 10.5* 9.6* 9.9* 9.2* 11.7* 10.2*  HCT 31.8* 31.0* 31.2* 31.9* 30.4* 38.4 33.0*  MCV 81.1  --  78.4* 79.4* 77.6* 80.0 77.1*  -P.o. ferrous sulfate with bowel regimen initiated at 325 mg po BID -Continue to Monitor for S/Sx of Bleeding; No overt bleeding noted  -Repeat CBC in the AM    History of Seizure Disorder -Stable.  Not on meds.   History of Asthma -Stable. -Continue home meds with Albuterol 3 mL IH every 6 hours as needed for wheezing or shortness of breath   Dysuria -Patient reported dysuria this morning.  She is already was on antibiotics.  Likely due to catheterization while in ICU. -Pyridium 200 mg 3 times daily for 2 days now stopped   GERD/GI Prophylaxis -Continue PPI with Pantoprazole and  Famotidine 20 mg po BID  Hypoalbuminemia -Patient's Albumin Trend: Recent Labs  Lab 08/09/22 0020 08/20/22 2040 08/22/22 1420 08/23/22 0422 08/26/22 0952 08/28/22 1119 08/29/22 0923  ALBUMIN 3.5 3.9 3.5 3.0* 3.2* 4.1 3.3*  -Continue to Monitor and Trend and repeat CMP in the AM    Obesity -Complicates overall prognosis and care -Estimated body mass index is 36.03 kg/m as calculated from the following:   Height as of this encounter: 5\' 5"  (1.651 m).   Weight as of this encounter: 98.2 kg.  -Weight Loss and Dietary Counseling given   DVT prophylaxis: SCDs Start: 08/22/22 1514    Code Status: Full Code Family Communication: No family present at bedside  Disposition Plan:  Level of care: Med-Surg Status is: Inpatient Remains inpatient appropriate because: Medically stable for D/C but has no Inpatient Psychiatry bed available    Consultants:  Psychiatry PCCM Transfer  Procedures:  As delineated as above  Antimicrobials:  Anti-infectives (From admission, onward)    Start     Dose/Rate Route Frequency Ordered Stop   08/26/22 1415  amoxicillin-clavulanate (AUGMENTIN) 875-125 MG per tablet 1 tablet        1 tablet Oral Every 12 hours 08/26/22 1316 08/27/22 2205   08/23/22 1200  cefTRIAXone (ROCEPHIN) 2 g in sodium chloride 0.9 % 100 mL IVPB  Status:  Discontinued        2 g 200 mL/hr over 30 Minutes Intravenous Every 24 hours 08/23/22 0931 08/26/22 1316  Subjective: Seen and examined at bedside and she is calmer but continues to have homicidal and suicidal ideations.  States that her pain in her neck is improved.  Still awaiting placement for inpatient psychiatric facility.  Objective: Vitals:   09/03/22 0402 09/03/22 0753 09/03/22 0843 09/03/22 1403  BP: 98/61 114/76  (!) 102/50  Pulse: 95 89 88 100  Resp: 20 20 18 19   Temp: 98.7 F (37.1 C) 97.8 F (36.6 C)  97.8 F (36.6 C)  TempSrc: Oral Oral  Oral  SpO2: 96% 97% 98% 99%  Weight: 98.2 kg      Height:        Intake/Output Summary (Last 24 hours) at 09/03/2022 1805 Last data filed at 09/03/2022 1330 Gross per 24 hour  Intake 600 ml  Output --  Net 600 ml   Filed Weights   09/01/22 0300 09/02/22 0500 09/03/22 0402  Weight: 98.8 kg 99 kg 98.2 kg   Examination: Physical Exam:  Constitutional: WN/WD obese Caucasian female in no acute distress appears calm Respiratory: Diminished to auscultation bilaterally, no wheezing, rales, rhonchi or crackles. Normal respiratory effort and patient is not tachypenic. No accessory muscle use.  Unlabored breathing Cardiovascular: RRR, no murmurs / rubs / gallops. S1 and S2 auscultated. No extremity edema.  Abdomen: Soft, non-tender, distended secondary to body habitus. Bowel sounds positive.  GU: Deferred. Musculoskeletal: No clubbing / cyanosis of digits/nails. No joint deformity upper and lower extremities.  Skin: No rashes, lesions, ulcers on limited skin evaluation. No induration; Warm and dry.  Neurologic: CN 2-12 grossly intact with no focal deficits. Romberg sign and cerebellar reflexes not assessed.  Psychiatric: Appears calm currently but continues to have intermittent suicidal homicidal thoughts.  Data Reviewed: I have personally reviewed following labs and imaging studies  CBC: Recent Labs  Lab 08/28/22 1119 08/29/22 0923  WBC 11.6* 8.3  NEUTROABS  --  5.0  HGB 11.7* 10.2*  HCT 38.4 33.0*  MCV 80.0 77.1*  PLT 345 265   Basic Metabolic Panel: Recent Labs  Lab 08/28/22 1119 08/29/22 0923  NA 137 136  K 3.9 3.7  CL 104 106  CO2 23 23  GLUCOSE 98 111*  BUN 14 12  CREATININE 0.80 0.68  CALCIUM 9.6 8.7*  MG 1.8 1.7  PHOS 3.0 3.1   GFR: Estimated Creatinine Clearance: 116.1 mL/min (by C-G formula based on SCr of 0.68 mg/dL). Liver Function Tests: Recent Labs  Lab 08/28/22 1119 08/29/22 0923  AST  --  20  ALT  --  16  ALKPHOS  --  71  BILITOT  --  0.2*  PROT  --  6.5  ALBUMIN 4.1 3.3*   No results for  input(s): "LIPASE", "AMYLASE" in the last 168 hours. No results for input(s): "AMMONIA" in the last 168 hours. Coagulation Profile: No results for input(s): "INR", "PROTIME" in the last 168 hours. Cardiac Enzymes: No results for input(s): "CKTOTAL", "CKMB", "CKMBINDEX", "TROPONINI" in the last 168 hours. BNP (last 3 results) No results for input(s): "PROBNP" in the last 8760 hours. HbA1C: No results for input(s): "HGBA1C" in the last 72 hours. CBG: No results for input(s): "GLUCAP" in the last 168 hours. Lipid Profile: No results for input(s): "CHOL", "HDL", "LDLCALC", "TRIG", "CHOLHDL", "LDLDIRECT" in the last 72 hours. Thyroid Function Tests: No results for input(s): "TSH", "T4TOTAL", "FREET4", "T3FREE", "THYROIDAB" in the last 72 hours. Anemia Panel: No results for input(s): "VITAMINB12", "FOLATE", "FERRITIN", "TIBC", "IRON", "RETICCTPCT" in the last 72 hours. Sepsis Labs: No results for  input(s): "PROCALCITON", "LATICACIDVEN" in the last 168 hours.  No results found for this or any previous visit (from the past 240 hour(s)).   Radiology Studies: No results found.  Scheduled Meds:  benztropine  1 mg Oral QHS   cyclobenzaprine  10 mg Oral QHS   diclofenac Sodium  2 g Topical QID   DULoxetine  60 mg Oral Daily   ferrous sulfate  325 mg Oral BID WC   gabapentin  600 mg Oral TID   metoprolol tartrate  12.5 mg Oral BID   mometasone-formoterol  2 puff Inhalation BID   mupirocin cream   Topical Daily   OLANZapine  10 mg Oral QHS   pantoprazole  40 mg Oral QAC breakfast   prazosin  1 mg Oral QHS   sodium bicarbonate  650 mg Oral TID   Continuous Infusions:  sodium chloride Stopped (08/24/22 1347)    LOS: 12 days   Marguerita Merles, DO [facility] Available via Epic secure chat 7am-7pm After these hours, please refer to coverage provider listed on [email or web address] 09/03/2022, 6:05 PM

## 2022-09-03 NOTE — Progress Notes (Signed)
Patient requested this nurse to come to her room.  She stated that her parents are constantly calling her asking where she is.  They are "threatening to come up here and rip my clothes off my body.  I just want to cut my own arm off.  I need to get out of here"  She then asked this nurse to inquire about getting a 50 foot restraining order for her parents.

## 2022-09-03 NOTE — Consult Note (Cosign Needed Addendum)
[name] Consult Evaluation  Service Date: September 03, 2022 LOS:  LOS: 12 days    Primary Psychiatric Diagnoses  Schizoaffective disorder, bipolar type  GAD  Assessment  Barbera Hinze is a 32 y.o. female with a past psychiatric history of borderline personality disorder, schizoaffective disorder bipolar type, and PTSD.  She has had numerous behavioral health admissions and an extensive history of suicide attempts.  Patient presents to [facility] ED for an attempted overdose on her prescription medications.  During this hospitalization, patient has required restraints due to increased agitation, disruptive behavior, and increased risk for harming herself. Patient reports command hallucinations, telling her to harm herself and others. She has had multiple suicide attempts this past year and has multiple risk factors, including severe mood instability, impulsivity, and significant interpersonal difficulties, all of which contribute to an increased risk of suicide. Although adequate safety measures remain in place, it is important to acknowledge the behavioral component to patient's disruptive behavior on the unit, consistent with her diagnosis of BPD. There are inconsistencies in patient's report when seen by different members of the team. Implementing a behavioral plan with clear boundaries and consistent structure willing be crucial in mitigating the use of restraints and help reduce boundary-testing behaviors. We plan to further outline this behavioral plan tomorrow with patient's participation and with the goal of encouraging emotional regulation and improving therapeutic engagement. For now, we recommend phone call privileges be used as a positive reinforcement tool by granting access if patient remains cooperative overnight.   She is currently on [facility] waitlist.   Diagnoses:  Active Hospital problems: Principal Problem:   Bipolar I disorder, current or most recent episode  depressed, with psychotic features (HCC) Active Problems:   Asthma   GAD (generalized anxiety disorder)   Suicide attempt (HCC)   Overdose   Drug overdose     Plan   ## Psychiatric Medication Recommendations:  -- -Continue Cymbalta 60 mg daily  -- Continue olanzapine 10mg  po daily at bedtime for psychosis. -- Continue Gabapentin 600 mg TID for agitated mood/anxiety -- Continue Olanzapine 10mg  IM daily a single dose, which appeared to be effective at this time. Patient continues to benefit from limit setting and boundaries as she can split staff.    ## Medical Decision Making Capacity:   Capacity was not formally addressed during this encounter  ## Further Work-up:  -- Per primary -- most recent EKG on 08/30/2022 had QtC of 469   ## Disposition:  -- We recommend inpatient psychiatric hospitalization after medical hospitalization. Patient has been involuntarily committed on 09/04/2022.   ## Behavioral / Environmental:  --  To minimize splitting of staff, assign one staff person to communicate all information from the team when feasible. -- Apply the established behavioral rules and protocols consistently throughout the night. Ensure that all staff members are aware of and adhere to the same guidelines to avoid confusion and mixed messages. -- Use phone call privileges as a positive reinforcement tool by granting access as a reward for desired behaviors.   ## Safety and Observation Level:  - Based on my clinical evaluation, I estimate the patient to be at elevated risk of self harm in the current setting - At this time, we recommend continuing a 1: 1 level of observation. This decision is based on my review of the chart including patient's history and current presentation, interview of the patient, mental status examination, and consideration of suicide risk including evaluating suicidal ideation, plan, intent, suicidal or  self-harm behaviors, risk factors, and protective factors.  This judgment is based on our ability to directly address suicide risk, implement suicide prevention strategies and develop a safety plan while the patient is in the clinical setting. Please contact our team if there is a concern that risk level has changed.  Suicide risk assessment  Patient has following modifiable risk factors for suicide: active suicidal ideation and recklessness, which we are addressing by continuing adequate safety precautions while she is hospitalized.   Patient has following non-modifiable or demographic risk factors for suicide: history of suicide attempt, history of self harm behavior, and psychiatric hospitalization  Patient has the following protective factors against suicide: Access to outpatient mental health care   Thank you for this consult request. Recommendations have been communicated to the primary team.  We will follow at this time.   Lorri Frederick, MD  Psychiatric and Social History   Relevant Aspects of Hospital Course:  Admitted on 08/22/2022 for suicide attempt by overdose on her prescription medication.   Patient Report:  Patient evaluated at bedside, reports that she is feeling "down" related to her pending status at [facility].  Informed patient that she is still on the wait list, will provide her with updates as they come. She was unsatisfied with this answer and admitted to disruptive behavior in the evenings because she has been bored.  She is not apologetic or remorseful for her behavior, stated "I do not know" when asked about coping strategies.  When asked about her current psychiatric medications, she states "nothing works".  Patient is also ruminative on her parents visiting, reports she becomes homicidal whenever they are around.  Attending physician, Dr. Gasper Sells, evaluated patient later in the afternoon, and reported to the attending that only her boyfriend Fayrene Fearing had come to visit.    Psychiatric ROS Mood Symptoms Reports increased  irritability and homicidal ideations.  She also reports low mood and suicidal ideations. Manic Symptoms Not formally assessed today Anxiety Symptoms Denies any symptoms of anxiety Trauma Symptoms Not formally assessed today.  Has history of PTSD diagnosed in 2016. Psychosis Symptoms Reports command AH telling her to bang her head against something in her room.  Denies paranoid ideations.  Past Psychiatric History: Previous Psych Diagnoses: Schizoaffective disorder bipolar type, Anxiety, Autism spectrum disorder, PTSD, Borderline personality disorder Prior inpatient treatment: multiple, Admitted to [facility] on 07/20/22, has been to [facility], and was discharged from inpatient facility past week. Current/prior outpatient treatment: [facility] Prior rehab hx: denies multiple Psychotherapy hx: [facility] History of suicide: 20-30 times by overdosing on pills, cutting or drinking 409 disinfectant History of homicide or aggression: denies Psychiatric medication history:  Previously trialed Tegretol, Haldol, Clozaril, Depakote, Invega, Lamictal, Seroquel, asenapine, abilify, vraylar, Lexapro, Zoloft  Neuromodulation history: denies  Social History:  Marital status: Widowed Children: daughter Lives: with boyfriend, in two story home Employment: Disability Tobacco: quit smoking; smoked for two years.  Access to weapons: None   Exam Findings   Psychiatric Specialty Exam:  Presentation  General Appearance:  Appropriate for Environment; Casual  Eye Contact: Good  Speech: Clear and Coherent; Normal Rate  Speech Volume: Normal  Handedness: Right   Mood and Affect  Mood: Dysphoric  Affect: Depressed; Constricted   Thought Process  Thought Processes: Linear; Coherent; Goal Directed  Descriptions of Associations: Intact  Orientation: Full (Time, Place and Person)  Thought Content: Logical  History of Schizophrenia/Schizoaffective disorder: Yes  Duration  of Psychotic Symptoms: Greater than six months  Hallucinations:Hallucinations: Command  Description of Command Hallucinations: telling patient to harm herself  Ideas of Reference: None  Suicidal Thoughts:Suicidal Thoughts: Yes, Active SI Active Intent and/or Plan: With Intent; Without Access to Means; Without Means to Carry Out SI Passive Intent and/or Plan: With Intent; Without Access to Means; Without Means to Carry Out  Homicidal Thoughts:Homicidal Thoughts: Yes, Active HI Active Intent and/or Plan: Without Access to Means; Without Means to [employer]; Without Plan   Sensorium  Memory: Immediate Fair; Recent Fair; Remote Fair  Judgment: Poor  Insight: Poor   Executive Functions  Concentration: Fair  Attention Span: Fair  Recall: [employer] of Knowledge: Fair  Language: Fair   Psychomotor Activity  Psychomotor Activity:Psychomotor Activity: Normal   Assets  Assets: Manufacturing systems engineer; Desire for Improvement   Sleep  Sleep:Sleep: Fair    Physical Exam: Vital signs:  Temp:  [97.8 F (36.6 C)-98.7 F (37.1 C)] 97.8 F (36.6 C) (07/29 1403) Pulse Rate:  [88-110] 100 (07/29 1403) Resp:  [18-20] 19 (07/29 1403) BP: (98-119)/(50-76) 102/50 (07/29 1403) SpO2:  [96 %-99 %] 99 % (07/29 1403) Weight:  [98.2 kg] 98.2 kg (07/29 0402) Physical Exam Vitals and nursing note reviewed. Exam conducted with a chaperone present.  Constitutional:      General: She is not in acute distress.    Appearance: She is not ill-appearing.  Pulmonary:     Effort: Pulmonary effort is normal. No respiratory distress.  Skin:    General: Skin is warm and dry.  Neurological:     General: No focal deficit present.     Mental Status: She is alert.  Psychiatric:        Attention and Perception: Attention and perception normal.        Mood and Affect: Affect is flat.        Speech: Speech normal.        Behavior: Behavior is not aggressive or combative.        Thought  Content: Thought content includes homicidal and suicidal ideation. Thought content includes homicidal and suicidal plan.        Judgment: Judgment is impulsive.     Blood pressure (!) 102/50, pulse 100, temperature 97.8 F (36.6 C), temperature source Oral, resp. rate 19, height 5\' 5"  (1.651 m), weight 98.2 kg, last menstrual period 08/08/2022, SpO2 99%. Body mass index is 36.03 kg/m.   Other History   These have been pulled in through the EMR, reviewed, and updated if appropriate.   Family History:  The patient's family history includes Asthma in her father; Mental illness in her father; PDD in her brother; Seizures in her brother.  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    Chronic constipation    Depression    Drug-seeking behavior    Essential tremor    Headache    Ineffective individual coping 05/16/2022   Insomnia 01/12/2022   Intentional drug overdose (HCC) 06/05/2022   Neuromuscular disorder (HCC)    Normocytic anemia 06/05/2022   Overdose 07/22/2017   Overdose of acetaminophen 07/2017   and other meds   Overdose, intentional self-harm, initial encounter (HCC) 07/20/2021  Paranoia (HCC) 04/22/2021   Personality disorder (HCC)    Purposeful non-suicidal drug ingestion (HCC) 06/27/2021   Schizo-affective psychosis (HCC)    Schizoaffective disorder (HCC) 07/29/2022   Schizoaffective disorder, bipolar type (HCC)    Seizures (HCC)    Last seizure December 2017   Skin erythema 04/27/2022   Sleep apnea    Suicidal behavior 07/25/2021   Suicidal ideation    Suicide (HCC) 07/01/2021   Suicide attempt (HCC) 07/04/2021    Surgical  History: Past Surgical History:  Procedure Laterality Date   MOUTH SURGERY  2009 or 2010    Medications:   Current Facility-Administered Medications:    0.9 %  sodium chloride infusion, 250 mL, Intravenous, Continuous, Gonfa, Taye T, MD, Stopped at 08/24/22 1347   albuterol (PROVENTIL) (2.5 MG/3ML) 0.083% nebulizer solution 3 mL, 3 mL, Inhalation, Q6H PRN, Gonfa, Taye T, MD   benztropine (COGENTIN) tablet 1 mg, 1 mg, Oral, QHS, Gonfa, Taye T, MD, 1 mg at 09/02/22 2103   cyclobenzaprine (FLEXERIL) tablet 10 mg, 10 mg, Oral, QHS, Gonfa, Taye T, MD, 10 mg at 09/02/22 2103   diclofenac Sodium (VOLTAREN) 1 % topical gel 2 g, 2 g, Topical, QID, Gonfa, Taye T, MD, 2 g at 09/03/22 1756   DULoxetine (CYMBALTA) DR capsule 60 mg, 60 mg, Oral, Daily, Akintayo, Mojeed, MD, 60 mg at 09/03/22 0910   ferrous sulfate tablet 325 mg, 325 mg, Oral, BID WC, Gonfa, Taye T, MD, 325 mg at 09/03/22 1630   gabapentin (NEURONTIN) capsule 600 mg, 600 mg, Oral, TID, Alanda Slim, Taye T, MD, 600 mg at 09/03/22 1630   hydrOXYzine (ATARAX) tablet 25 mg, 25 mg, Oral, TID PRN, Alanda Slim, Taye T, MD, 25 mg at 09/03/22 1630   ibuprofen (ADVIL) tablet 600 mg, 600 mg, Oral, TID PRN, Alanda Slim, Taye T, MD, 600 mg at 09/01/22 2026   LORazepam (ATIVAN) tablet 0.5 mg, 0.5 mg, Oral, BID PRN, Alanda Slim, Taye T, MD, 0.5 mg at 09/03/22 1341   metoprolol tartrate (LOPRESSOR) tablet 12.5 mg, 12.5 mg, Oral, BID, Gonfa, Taye T, MD, 12.5 mg at 09/03/22 0910   mometasone-formoterol (DULERA) 200-5 MCG/ACT inhaler 2 puff, 2 puff, Inhalation, BID, Alanda Slim, Taye T, MD, 2 puff at 09/03/22 0843   mupirocin cream (BACTROBAN) 2 %, , Topical, Daily, Marguerita Merles Lowell, DO, Given at 09/03/22 0912   OLANZapine (ZYPREXA) injection 10 mg, 10 mg, Intramuscular, Once PRN, Akintayo, Mojeed, MD   OLANZapine (ZYPREXA) tablet 10 mg, 10 mg, Oral, QHS, Gonfa, Taye T, MD, 10 mg at 09/02/22 2103   ondansetron (ZOFRAN-ODT) disintegrating tablet 4 mg, 4 mg, Oral, Q8H PRN, Sheikh, Omair  Latif, DO, 4 mg at 09/01/22 2025   Oral care mouth rinse, 15 mL, Mouth Rinse, PRN, Gonfa, Taye T, MD   pantoprazole (PROTONIX) EC tablet 40 mg, 40 mg, Oral, QAC breakfast, Gonfa, Taye T, MD, 40 mg at 09/03/22 0910   polyethylene glycol (MIRALAX / GLYCOLAX) packet 17 g, 17 g, Oral, BID PRN, Alanda Slim, Taye T, MD, 17 g at 09/03/22 0909   prazosin (MINIPRESS) capsule 1 mg, 1 mg, Oral, QHS, Gonfa, Taye T, MD, 1 mg at 09/02/22 2103   risperiDONE (RISPERDAL M-TABS) disintegrating tablet 1 mg, 1 mg, Oral, BID PRN, Starkes-Perry, Juel Burrow, FNP   senna-docusate (Senokot-S) tablet 1 tablet, 1 tablet, Oral, BID PRN, Candelaria Stagers T, MD, 1 tablet at 09/03/22 0910   sodium bicarbonate tablet 650 mg, 650 mg, Oral, TID, Candelaria Stagers T, MD, 650 mg at 09/03/22 1630   traZODone (  DESYREL) tablet 50 mg, 50 mg, Oral, QHS PRN, Candelaria Stagers T, MD, 50 mg at 09/01/22 0008  Allergies: Allergies  Allergen Reactions   Bee Venom Anaphylaxis   Coconut Flavor Anaphylaxis and Rash   Fish Allergy Anaphylaxis   Geodon [Ziprasidone Hcl] Other (See Comments)    Pt states that this medication causes paralysis of the mouth.     Haloperidol And Related Other (See Comments)    Pt states that this medication causes paralysis of the mouth, jaw locks up   Lithobid [Lithium] Other (See Comments)    Seizure-like activity    Roxicodone [Oxycodone] Other (See Comments)    Hallucinations    Seroquel [Quetiapine] Other (See Comments)    Severe drowsiness - Pt currently taking 12.5 mg BID, she is ok with this dose   Shellfish Allergy Anaphylaxis   Phenergan [Promethazine Hcl] Other (See Comments)    Chest pain     Prilosec [Omeprazole] Nausea And Vomiting and Other (See Comments)    Pt can take protonix with no problems    Sulfa Antibiotics Other (See Comments)    Chest pain    Tegretol [Carbamazepine] Nausea And Vomiting   Prozac [Fluoxetine] Other (See Comments)    Increased Depression and Suicidal thoughts   Tape Other (See  Comments)    Skin tears, can only tolerate paper tape.   Tylenol [Acetaminophen] Rash and Other (See Comments)    Rash on face

## 2022-09-04 DIAGNOSIS — J4520 Mild intermittent asthma, uncomplicated: Secondary | ICD-10-CM | POA: Diagnosis not present

## 2022-09-04 DIAGNOSIS — T50902A Poisoning by unspecified drugs, medicaments and biological substances, intentional self-harm, initial encounter: Secondary | ICD-10-CM | POA: Diagnosis not present

## 2022-09-04 DIAGNOSIS — T1491XA Suicide attempt, initial encounter: Secondary | ICD-10-CM | POA: Diagnosis not present

## 2022-09-04 DIAGNOSIS — F315 Bipolar disorder, current episode depressed, severe, with psychotic features: Secondary | ICD-10-CM | POA: Diagnosis not present

## 2022-09-04 MED ORDER — LORAZEPAM 0.5 MG PO TABS
0.5000 mg | ORAL_TABLET | Freq: Once | ORAL | Status: AC | PRN
  Administered 2022-09-08: 0.5 mg via ORAL
  Filled 2022-09-04: qty 1

## 2022-09-04 NOTE — TOC Progression Note (Addendum)
Transition of Care [facility]) - Progression Note    Patient Details  Name: [name] MRN: [id number] Date of Birth: July 28, 1990  Transition of [facility]) CM/SW Contact  Lorri Frederick, LCSW Phone Number: 09/04/2022, 3:02 PM  Clinical Narrative:   CSW spoke with [facility] admissions.  They did not receive notes faxed yesterday.  CSW refaxed notes.  CSW then spoke with RN in admissions, Dr Nyra Jabs can be contacted regarding pt: [phone].  However RN would prefer to review the notes and see if pt can be added to priority list prior to contacting having [facility] contact Dr Pershing Proud.   1545: [facility].  Asking for notes showing aggressive behaviors in the past 48 hours.  Prior notes were from Saturday.  Several more notes faxed.         Expected Discharge Plan and Services                                               Social Determinants of Health (SDOH) Interventions SDOH Screenings   Food Insecurity: No Food Insecurity (07/29/2022)  Housing: Low Risk  (07/29/2022)  Transportation Needs: No Transportation Needs (07/29/2022)  Recent Concern: Transportation Needs - Unmet Transportation Needs (05/06/2022)  Utilities: Not At Risk (07/29/2022)  Alcohol Screen: Low Risk  (07/29/2022)  Depression (PHQ2-9): Medium Risk (03/10/2022)  Tobacco Use: Medium Risk (08/22/2022)    Readmission Risk Interventions    06/06/2022    3:38 PM  Readmission Risk Prevention Plan  Transportation Screening Complete  Medication Review (RN Care Manager) Complete  PCP or Specialist appointment within 3-5 days of discharge Complete  HRI or Home Care Consult Complete  SW Recovery Care/Counseling Consult Complete  Palliative Care Screening Not Applicable  Skilled Nursing Facility Not Applicable

## 2022-09-04 NOTE — Progress Notes (Signed)
PROGRESS NOTE    [name]  FAO:[id number] DOB: 01-24-1991 DOA: 08/22/2022 PCP: [facility]   Brief Narrative:  The patient is a 32 y.o. F with PMH of adjustment disorder with mixed anxiety and depressed mood, autism, bipolar 1 disorder, fragile x syndrome, seizures not on meds, schizoaffective disorder and multiple suicide attempts in the last year who presented to the ED after ingesting numerous medications in a suicide attempt.  She reported taking #30 metoprolol 25mg  tablets, #60 Hydroxyzine 25mg , #7 Lexapro 20mg , #21 Gabapentin 600mg , #7 Flexeril 10mg , #7 protonix 40mg  and #7 Prazosin 1mg .   She was slightly bradycardic and hypotensive on EMS arrival which improved with fluids.  Poison control was contacted and as she was within 2hrs of ingestion was given activated charcoal.  Placed under IVC in ED.  Poison control also recommended ICU admission for close Qtc and electrolyte monitoring and possible pressors.  At the time of admission, pt was somnolent but arousable to voice and following commands. She was intubated and started on vasopressor and glucagon.  Also started on ceftriaxone for possible pneumonia.  Patient was extubated on 7/18 and transferred to the floor on 7/19. She was transferred to [facility] service on 08/25/22.   Psychiatry recommended inpatient psychiatric hospitalization, and placed APS orders. She remains medically stable for D/C now and awaiting Inpatient Psychiatric Placement. She is having quite a bit of behavioral issues and hearing voices intermittently with paranoid delusions and has had to be restrained in 4-point restraints and given IM Zyprexa intermittently but from a medical standpoint she can be discharged at any time.  Assessment and Plan:  Suicidal Attempt with intentional overdose:  -Overdose with multiple medications including #30 metoprolol 25mg  tablets, #60 Hydroxyzine 25mg , #7 Lexapro 20mg , #21 Gabapentin 600mg , #7 Flexeril 10mg , #7  protonix 40mg  and #7 Prazosin 1mg .  Poison control consulted. Received charcoal. Denies ETOH or illicit drug use.  Extended UDS negative. -Required vasopressor, glucagon, intubation and mechanical ventilation 7/17-7/18.  Room air and hemodynamically stable. -Placed under IVC in the ED and renewed. -Continue one-to-one Recruitment consultant -Patient is medically stable pending inpatient psych bed -Psychiatry placed APS referral as well. -Further Care per Psych and she had to be placed in a 4 point restraints and given IM Zyprexa intermittently given her significant behavioral disturbances and homicidal ideations along with hearing voices in her head -Psychiatry Is increasing her Cymbalta from 30 mg to 60 mg daily and has had favorable response to increase but has Suicidal Thoughts as well   Anxiety and Depression, Schizoaffective Disorder, Adjustment Disorder, bipolar disorder, fibromyalgia and auditory hallucination: -Hallucinations seems to have resolved.  Patient is on Cogentin, Zyprexa, Atarax, prazosin and Lexapro at home -Appreciate input by psych and further care per their Protocol -Continue home meds.  Lexapro changed to Cymbalta -Zyprexa as needed and psychiatry recommending olanzapine 10 mg IM daily single dose as needed -Patient started hearing voices in her head that were "too much for her to handle" and then she threatened "that she was in a kill and strangle one of the staff members" -Intermittently has to be placed in 4-point restraints and given IM Zyprexa.  She still has a one-to-one sitter at bedside and will continue -Cymbalta has been increased from 30 mg to 60 mg daily and will be continuing olanzapine 10 mg p.o. daily nightly for psychosis and continuing gabapentin 600 mg 3 times daily for agitated mood/anxiety -Continues to Endorse Suicidal Thoughts and Ideation   CAP -No Respiratory Symptoms.   -  CXR showed atelectasis or infiltrate in left midlung.  Pro-Cal elevated at 14.43 -WBC  Trend: Recent Labs  Lab 08/22/22 1420 08/22/22 1536 08/23/22 0422 08/25/22 0726 08/26/22 0952 08/28/22 1119 08/29/22 0923  WBC 8.8 9.0 16.1* 6.4 6.8 11.6* 8.3  -Received ceftriaxone. SpO2: 99 % O2 Flow Rate (L/min): 0 L/min FiO2 (%): 40 % -Completed course with p.o. Augmentin.   HTN -Normotensive. -Resume home Lopressor -Continue monitor blood pressures per protocol -Last blood pressure reading was 111/68   Sinus Tachycardia -Resolved. -Continue home metoprolol.   Metabolic Acidosis -Improved.  Patient's CO2 is now 23, anion gap is 7, chloride level 106 -P.o. sodium bicarbonate. -Continue monitor trend and repeat CMP in a.m.   Hypomagnesemia -Patient's Mag Level Trend: Recent Labs  Lab 08/22/22 1420 08/22/22 2337 08/23/22 0422 08/25/22 0726 08/26/22 0952 08/28/22 1119 08/29/22 0923  MG 2.0 1.9 1.9 1.5* 1.7 1.8 1.7  -Continue to Monitor and Replete as Necessary -Repeat Mag in the AM   Microcytic Anemia -H&H relatively stable.  Anemia panel with some iron deficiency with elevated TIBC and low ferritin. -Hgb/Hct Trend: Recent Labs  Lab 08/22/22 1536 08/22/22 1934 08/23/22 0422 08/25/22 0726 08/26/22 0952 08/28/22 1119 08/29/22 0923  HGB 9.4* 10.5* 9.6* 9.9* 9.2* 11.7* 10.2*  HCT 31.8* 31.0* 31.2* 31.9* 30.4* 38.4 33.0*  MCV 81.1  --  78.4* 79.4* 77.6* 80.0 77.1*  -P.o. ferrous sulfate with bowel regimen initiated at 325 mg po BID -Continue to Monitor for S/Sx of Bleeding; No overt bleeding noted  -Repeat CBC in the AM    History of Seizure Disorder -Stable.  Not on meds.   History of Asthma -Stable. -Continue home meds with Albuterol 3 mL IH every 6 hours as needed for wheezing or shortness of breath   Dysuria -Patient reported dysuria this morning.  She is already was on antibiotics.  Likely due to catheterization while in ICU. -Pyridium 200 mg 3 times daily for 2 days now stopped   GERD/GI Prophylaxis -Continue PPI with Pantoprazole and  Famotidine 20 mg po BID  Hypoalbuminemia -Patient's Albumin Trend: Recent Labs  Lab 08/09/22 0020 08/20/22 2040 08/22/22 1420 08/23/22 0422 08/26/22 0952 08/28/22 1119 08/29/22 0923  ALBUMIN 3.5 3.9 3.5 3.0* 3.2* 4.1 3.3*  -Continue to Monitor and Trend and repeat CMP in the AM    Obesity -Complicates overall prognosis and care -Estimated body mass index is 36.03 kg/m as calculated from the following:   Height as of this encounter: 5\' 5"  (1.651 m).   Weight as of this encounter: 98.2 kg.  -Weight Loss and Dietary Counseling given   DVT prophylaxis: SCDs Start: 08/22/22 1514    Code Status: Full Code Family Communication: No family present at bedside   Disposition Plan:  Level of care: Med-Surg Status is: Inpatient Remains inpatient appropriate because: Remains Medically stable to D/C to Inpatient Psychiatric Facility but waiting for    Consultants:  Psychiatry PCCM Transfer  Procedures:  As delineated as above  Antimicrobials:  Anti-infectives (From admission, onward)    Start     Dose/Rate Route Frequency Ordered Stop   08/26/22 1415  amoxicillin-clavulanate (AUGMENTIN) 875-125 MG per tablet 1 tablet        1 tablet Oral Every 12 hours 08/26/22 1316 08/27/22 2205   08/23/22 1200  cefTRIAXone (ROCEPHIN) 2 g in sodium chloride 0.9 % 100 mL IVPB  Status:  Discontinued        2 g 200 mL/hr over 30 Minutes Intravenous Every 24 hours 08/23/22 0931  08/26/22 1316       Subjective: Seen and examined at bedside and she appears calmer and in no acute distress.  States pain in her neck improved and had a bowel movement.  Currently continues to await placement for inpatient psychiatric facility.  Objective: Vitals:   09/03/22 1403 09/03/22 1958 09/03/22 2001 09/04/22 0805  BP: (!) 102/50  121/69 111/68  Pulse: 100   100  Resp: 19  18 16   Temp: 97.8 F (36.6 C)  98.8 F (37.1 C) 98.3 F (36.8 C)  TempSrc: Oral  Oral Oral  SpO2: 99% 99% 100% 99%  Weight:       Height:        Intake/Output Summary (Last 24 hours) at 09/04/2022 1423 Last data filed at 09/04/2022 0730 Gross per 24 hour  Intake 120 ml  Output --  Net 120 ml   Filed Weights   09/01/22 0300 09/02/22 0500 09/03/22 0402  Weight: 98.8 kg 99 kg 98.2 kg   Examination: Physical Exam:  Constitutional: WN/WD obese Caucasian female in no acute distress al masses, normal ROM, no appreciable thyromegaly Respiratory: Diminished to auscultation bilaterally, no wheezing, rales, rhonchi or crackles. Normal respiratory effort and patient is not tachypenic. No accessory muscle use.  Unlabored breathing Cardiovascular: RRR, no murmurs / rubs / gallops. S1 and S2 auscultated. No extremity edema.  Abdomen: Soft, non-tender, distended secondary to body habitus. Bowel sounds positive.  GU: Deferred. Musculoskeletal: No clubbing / cyanosis of digits/nails. No joint deformity upper and lower extremities. Skin: No rashes, lesions, ulcers on a limited skin evaluation. No induration; Warm and dry.  Neurologic: CN 2-12 grossly intact with no focal deficits.  Romberg sign and cerebellar reflexes not assessed.  Psychiatric: Appears calm today.  Data Reviewed: I have personally reviewed following labs and imaging studies  CBC: Recent Labs  Lab 08/29/22 0923  WBC 8.3  NEUTROABS 5.0  HGB 10.2*  HCT 33.0*  MCV 77.1*  PLT 265   Basic Metabolic Panel: Recent Labs  Lab 08/29/22 0923  NA 136  K 3.7  CL 106  CO2 23  GLUCOSE 111*  BUN 12  CREATININE 0.68  CALCIUM 8.7*  MG 1.7  PHOS 3.1   GFR: Estimated Creatinine Clearance: 116.1 mL/min (by C-G formula based on SCr of 0.68 mg/dL). Liver Function Tests: Recent Labs  Lab 08/29/22 0923  AST 20  ALT 16  ALKPHOS 71  BILITOT 0.2*  PROT 6.5  ALBUMIN 3.3*   No results for input(s): "LIPASE", "AMYLASE" in the last 168 hours. No results for input(s): "AMMONIA" in the last 168 hours. Coagulation Profile: No results for input(s): "INR",  "PROTIME" in the last 168 hours. Cardiac Enzymes: No results for input(s): "CKTOTAL", "CKMB", "CKMBINDEX", "TROPONINI" in the last 168 hours. BNP (last 3 results) No results for input(s): "PROBNP" in the last 8760 hours. HbA1C: No results for input(s): "HGBA1C" in the last 72 hours. CBG: No results for input(s): "GLUCAP" in the last 168 hours. Lipid Profile: No results for input(s): "CHOL", "HDL", "LDLCALC", "TRIG", "CHOLHDL", "LDLDIRECT" in the last 72 hours. Thyroid Function Tests: No results for input(s): "TSH", "T4TOTAL", "FREET4", "T3FREE", "THYROIDAB" in the last 72 hours. Anemia Panel: No results for input(s): "VITAMINB12", "FOLATE", "FERRITIN", "TIBC", "IRON", "RETICCTPCT" in the last 72 hours. Sepsis Labs: No results for input(s): "PROCALCITON", "LATICACIDVEN" in the last 168 hours.  No results found for this or any previous visit (from the past 240 hour(s)).   Radiology Studies: No results found.  Scheduled Meds:  benztropine  1 mg Oral QHS   cyclobenzaprine  10 mg Oral QHS   diclofenac Sodium  2 g Topical QID   DULoxetine  60 mg Oral Daily   ferrous sulfate  325 mg Oral BID WC   gabapentin  600 mg Oral TID   metoprolol tartrate  12.5 mg Oral BID   mometasone-formoterol  2 puff Inhalation BID   mupirocin cream   Topical Daily   OLANZapine  10 mg Oral QHS   pantoprazole  40 mg Oral QAC breakfast   prazosin  1 mg Oral QHS   sodium bicarbonate  650 mg Oral TID   Continuous Infusions:  sodium chloride Stopped (08/24/22 1347)    LOS: 13 days   Merlene Laughter, DO [facility] Available via Epic secure chat 7am-7pm After these hours, please refer to coverage provider listed on [email or web address] 09/04/2022, 2:23 PM

## 2022-09-04 NOTE — Progress Notes (Signed)
   09/04/22 1436  Spiritual Encounters  Type of Visit Initial  Care provided to: Patient  Conversation partners present during encounter Other (comment) Psychiatrist)  Referral source Patient request  Reason for visit Routine spiritual support  OnCall Visit No   Reason For Visit:    Chaplain visiting Pt in response to a Spiritual Consult placed by Pt asking to visit with a chaplain.  In addition, the chaplain team recommended that Pt enjoys chaplain visits.  Interventions:     Explored emotional needs and resources; facilitated storytelling; explored relational needs and resources; listened and reflected empathetically.  Outcomes:     Pt expressed intermediate hope in placement in [facility] Verbally processed emotions  Assessment:      Needs- Care recipient seems to be distressed by her perspective of her families behaviors Feels betrayal, frustration at her lack of control in her own life, and sadness over separation from her child Resources- Pt appears to draw some hope from her faith and gospel music She has a special friendship that appears to support her with courage and strength Description- While I did not expect that my conversation with Pt would be one of great depth, she seemed more distracted than I was hoping she would be.  She mentioned several times that she was looking forward to calling her friend on his lunch break at 2 and I felt it left her feeling less than willing to participate in conversing with me. I dismissed myself at 2 so she could make the call. Will f/u another time.  Plan:     Chaplain asked the Pt if I could visit again and she seemed enthusiastic about that.  I will place her on my list to f/u with from time to time, hoping to earn a deeper trust with her.

## 2022-09-04 NOTE — Consult Note (Cosign Needed)
[name] Consult Evaluation  Service Date: September 04, 2022 LOS:  LOS: 13 days    Primary Psychiatric Diagnoses  Schizoaffective disorder, bipolar type  GAD  Assessment  [name] is a 32 y.o. female with a past psychiatric history of borderline personality disorder, schizoaffective disorder bipolar type, and PTSD.  She has had numerous behavioral health admissions and an extensive history of suicide attempts.  Patient presents to [facility] ED for an attempted overdose on her prescription medications.  During this hospitalization, patient has required restraints due to increased agitation, disruptive behavior, and increased risk for harming herself. Patient reports command hallucinations, telling her to harm herself and others. She has had multiple suicide attempts this past year and has multiple risk factors, including severe mood instability, impulsivity, and significant interpersonal difficulties, all of which contribute to an increased risk of suicide. Although adequate safety measures remain in place, it is important to acknowledge the behavioral component to patient's disruptive behavior on the unit, consistent with her diagnosis of BPD. There are inconsistencies in patient's report when seen by different members of the team.   7/30: No behavioral concerns overnight. Behavioral plan has been created with patient's collaboration and agreement today. Phone call privileges and walks on the unit will be used as positive reinforcement tools by granting access if she remains off restraints.   She is currently on [facility] waitlist.     Diagnoses:  Active Hospital problems: Principal Problem:   Bipolar I disorder, current or most recent episode depressed, with psychotic features (HCC) Active Problems:   Asthma   GAD (generalized anxiety disorder)   Suicide attempt (HCC)   Overdose   Drug overdose     Plan   ## Psychiatric Medication Recommendations:  -- -Continue  Cymbalta 60 mg daily  -- Continue olanzapine 10mg  po daily at bedtime for psychosis. -- Continue Gabapentin 600 mg TID for agitated mood/anxiety -- Continue Olanzapine 10mg  IM daily a single dose, which appeared to be effective at this time. Patient continues to benefit from limit setting and boundaries as she can split staff.    ## Medical Decision Making Capacity:   Capacity was not formally addressed during this encounter  ## Further Work-up:  -- Per primary -- most recent EKG on 08/30/2022 had QtC of 469   ## Disposition:  -- We recommend inpatient psychiatric hospitalization after medical hospitalization. Patient has been involuntarily committed on 09/04/2022.   ## Behavioral / Environmental:  --  To minimize splitting of staff, assign one staff person to communicate all information from the team when feasible. -- Apply the established behavioral rules and protocols consistently throughout the night (see nursing care order for details). Ensure that all staff members are aware of and adhere to the same guidelines to avoid confusion and mixed messages.   ## Safety and Observation Level:  - Based on my clinical evaluation, I estimate the patient to be at elevated risk of self harm in the current setting - At this time, we recommend continuing a 1: 1 level of observation. This decision is based on my review of the chart including patient's history and current presentation, interview of the patient, mental status examination, and consideration of suicide risk including evaluating suicidal ideation, plan, intent, suicidal or self-harm behaviors, risk factors, and protective factors. This judgment is based on our ability to directly address suicide risk, implement suicide prevention strategies and develop a safety plan while the patient is in the clinical setting. Please contact our team  if there is a concern that risk level has changed.  Suicide risk assessment  Patient has following  modifiable risk factors for suicide: active suicidal ideation and recklessness, which we are addressing by continuing adequate safety precautions while she is hospitalized.   Patient has following non-modifiable or demographic risk factors for suicide: history of suicide attempt, history of self harm behavior, and psychiatric hospitalization  Patient has the following protective factors against suicide: Access to outpatient mental health care   Thank you for this consult request. Recommendations have been communicated to the primary team.  We will follow at this time.   Lorri Frederick, MD  Psychiatric and Social History   Relevant Aspects of Hospital Course:  Admitted on 08/22/2022 for suicide attempt by overdose on her prescription medication.   Patient Report:  Pt seen in AM. She remains upset and frustrated that she [facility], and was complimented on her ability to express those emotions verbally rather than requiring restraints. The bulk of the interview was spent on creating a behavioral plan with input from pt, which was printed for the patient and put in as a nursing order. She shares that her friend Fayrene Fearing has visited 3 times since she came to the hospital. We discussed the boundaries around phone calls (had not been allowed previously). We discussed the coping mechanisms she used for CAH she experienced overnight.   Psychiatric ROS Mood Symptoms Reports increased irritability and homicidal ideations.  She also reports low mood and suicidal ideations. Manic Symptoms Not formally assessed today Anxiety Symptoms Denies any symptoms of anxiety Trauma Symptoms Not formally assessed today.  Has history of PTSD diagnosed in 2016. Psychosis Symptoms Reports command AH telling her to bang her head against something in her room.  Denies paranoid ideations.  Past Psychiatric History: Previous Psych Diagnoses: Schizoaffective disorder bipolar type, Anxiety,  Autism spectrum disorder, PTSD, Borderline personality disorder Prior inpatient treatment: multiple, Admitted to [facility] on 07/20/22, has been to [facility], and was discharged from inpatient facility past week. Current/prior outpatient treatment: [facility] Prior rehab hx: denies multiple Psychotherapy hx: [facility] History of suicide: 20-30 times by overdosing on pills, cutting or drinking 409 disinfectant History of homicide or aggression: denies Psychiatric medication history:  Previously trialed Tegretol, Haldol, Clozaril, Depakote, Invega, Lamictal, Seroquel, asenapine, abilify, vraylar, Lexapro, Zoloft  Neuromodulation history: denies  Social History:  Marital status: Widowed Children: daughter Lives: with boyfriend, in two story home Employment: Disability Tobacco: quit smoking; smoked for two years.  Access to weapons: None   Exam Findings   Psychiatric Specialty Exam:  Presentation  General Appearance:  Casual; Appropriate for Environment  Eye Contact: Good  Speech: Clear and Coherent; Normal Rate  Speech Volume: Normal  Handedness: -- (Not assessed.)   Mood and Affect  Mood: Depressed ("Better")  Affect: Depressed; Congruent   Thought Process  Thought Processes: Coherent; Goal Directed; Linear  Descriptions of Associations: Intact  Orientation: Full (Time, Place and Person)  Thought Content: Logical  History of Schizophrenia/Schizoaffective disorder: Yes  Duration of Psychotic Symptoms: > 6 months  Hallucinations:Hallucinations: None Description of Command Hallucinations: telling patient to harm herself  Ideas of Reference: None  Suicidal Thoughts:Suicidal Thoughts: Yes, Passive SI Active Intent and/or Plan: Without Intent; Without Plan SI Passive Intent and/or Plan: With Intent; Without Access to Means; Without Means to Carry Out  Homicidal Thoughts:Homicidal Thoughts: Yes, Passive HI Active Intent and/or Plan: Without  Intent; Without Plan   Sensorium  Memory: Immediate Fair; Recent  Fair; Remote Fair  Judgment: Poor  Insight: Shallow   Executive Functions  Concentration: Fair  Attention Span: Fair  Recall: [employer] of Knowledge: Fair  Language: Fair   Psychomotor Activity  Psychomotor Activity:Psychomotor Activity: Normal   Assets  Assets: Resilience; Desire for Improvement   Sleep  Sleep:Sleep: Good    Physical Exam: Vital signs:  Temp:  [97.8 F (36.6 C)-98.8 F (37.1 C)] 98.3 F (36.8 C) (07/30 0805) Pulse Rate:  [100] 100 (07/30 0805) Resp:  [16-19] 16 (07/30 0805) BP: (102-121)/(50-69) 111/68 (07/30 0805) SpO2:  [99 %-100 %] 99 % (07/30 0805) Physical Exam Vitals and nursing note reviewed. Exam conducted with a chaperone present.  Constitutional:      General: She is not in acute distress.    Appearance: She is not ill-appearing.  Pulmonary:     Effort: Pulmonary effort is normal. No respiratory distress.  Skin:    General: Skin is warm and dry.  Neurological:     General: No focal deficit present.     Mental Status: She is alert.  Psychiatric:        Attention and Perception: Attention and perception normal.        Mood and Affect: Affect is flat.        Speech: Speech normal.        Behavior: Behavior is not aggressive or combative.        Thought Content: Thought content includes homicidal and suicidal ideation. Thought content includes homicidal and suicidal plan.        Judgment: Judgment is impulsive.     Blood pressure 111/68, pulse 100, temperature 98.3 F (36.8 C), temperature source Oral, resp. rate 16, height 5\' 5"  (1.651 m), weight 98.2 kg, last menstrual period 08/08/2022, SpO2 99%. Body mass index is 36.03 kg/m.   Other History   These have been pulled in through the EMR, reviewed, and updated if appropriate.   Family History:  The patient's family history includes Asthma in her father; Mental illness in her father; PDD in  her brother; Seizures in her brother.  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    Chronic constipation    Depression    Drug-seeking behavior    Essential tremor    Headache    Ineffective individual coping 05/16/2022   Insomnia 01/12/2022   Intentional drug overdose (HCC) 06/05/2022   Neuromuscular disorder (HCC)    Normocytic anemia 06/05/2022   Overdose 07/22/2017   Overdose of acetaminophen 07/2017   and other meds   Overdose, intentional self-harm, initial encounter (HCC) 07/20/2021   Paranoia (HCC) 04/22/2021   Personality disorder (HCC)    Purposeful non-suicidal drug ingestion (HCC) 06/27/2021   Schizo-affective psychosis (HCC)    Schizoaffective disorder (HCC) 07/29/2022   Schizoaffective disorder, bipolar type (HCC)    Seizures (HCC)    Last seizure December 2017   Skin erythema 04/27/2022   Sleep apnea    Suicidal behavior 07/25/2021   Suicidal ideation    Suicide (HCC) 07/01/2021   Suicide attempt (HCC) 07/04/2021    Surgical History: Past Surgical History:  Procedure Laterality Date  MOUTH SURGERY  2009 or 2010    Medications:   Current Facility-Administered Medications:    0.9 %  sodium chloride infusion, 250 mL, Intravenous, Continuous, Gonfa, Taye T, MD, Stopped at 08/24/22 1347   albuterol (PROVENTIL) (2.5 MG/3ML) 0.083% nebulizer solution 3 mL, 3 mL, Inhalation, Q6H PRN, Alanda Slim, Taye T, MD   benztropine (COGENTIN) tablet 1 mg, 1 mg, Oral, QHS, Gonfa, Taye T, MD, 1 mg at 09/03/22 2131   cyclobenzaprine (FLEXERIL) tablet 10 mg, 10 mg, Oral, QHS, Gonfa, Taye T, MD, 10 mg at 09/03/22 2131   diclofenac Sodium (VOLTAREN) 1  % topical gel 2 g, 2 g, Topical, QID, Gonfa, Taye T, MD, 2 g at 09/03/22 2131   DULoxetine (CYMBALTA) DR capsule 60 mg, 60 mg, Oral, Daily, Akintayo, Mojeed, MD, 60 mg at 09/04/22 0820   ferrous sulfate tablet 325 mg, 325 mg, Oral, BID WC, Gonfa, Taye T, MD, 325 mg at 09/04/22 1610   gabapentin (NEURONTIN) capsule 600 mg, 600 mg, Oral, TID, Alanda Slim, Taye T, MD, 600 mg at 09/04/22 0820   hydrOXYzine (ATARAX) tablet 25 mg, 25 mg, Oral, TID PRN, Alanda Slim, Taye T, MD, 25 mg at 09/03/22 1630   ibuprofen (ADVIL) tablet 600 mg, 600 mg, Oral, TID PRN, Candelaria Stagers T, MD, 600 mg at 09/01/22 2026   LORazepam (ATIVAN) tablet 0.5 mg, 0.5 mg, Oral, BID PRN, Alanda Slim, Taye T, MD, 0.5 mg at 09/03/22 2131   metoprolol tartrate (LOPRESSOR) tablet 12.5 mg, 12.5 mg, Oral, BID, Gonfa, Taye T, MD, 12.5 mg at 09/04/22 9604   mometasone-formoterol (DULERA) 200-5 MCG/ACT inhaler 2 puff, 2 puff, Inhalation, BID, Alanda Slim, Taye T, MD, 2 puff at 09/04/22 0819   mupirocin cream (BACTROBAN) 2 %, , Topical, Daily, Sheikh, Omair Latif, DO, Given at 09/04/22 1050   OLANZapine (ZYPREXA) injection 10 mg, 10 mg, Intramuscular, Once PRN, Akintayo, Mojeed, MD   OLANZapine (ZYPREXA) tablet 10 mg, 10 mg, Oral, QHS, Gonfa, Taye T, MD, 10 mg at 09/03/22 2130   ondansetron (ZOFRAN-ODT) disintegrating tablet 4 mg, 4 mg, Oral, Q8H PRN, Sheikh, Omair Latif, DO, 4 mg at 09/01/22 2025   Oral care mouth rinse, 15 mL, Mouth Rinse, PRN, Gonfa, Taye T, MD   pantoprazole (PROTONIX) EC tablet 40 mg, 40 mg, Oral, QAC breakfast, Gonfa, Taye T, MD, 40 mg at 09/04/22 0821   polyethylene glycol (MIRALAX / GLYCOLAX) packet 17 g, 17 g, Oral, BID PRN, Alanda Slim, Taye T, MD, 17 g at 09/04/22 0819   prazosin (MINIPRESS) capsule 1 mg, 1 mg, Oral, QHS, Gonfa, Taye T, MD, 1 mg at 09/03/22 2131   risperiDONE (RISPERDAL M-TABS) disintegrating tablet 1 mg, 1 mg, Oral, BID PRN, Starkes-Perry, Juel Burrow, FNP   senna-docusate (Senokot-S) tablet 1 tablet, 1 tablet, Oral, BID PRN, Candelaria Stagers T, MD, 1 tablet at 09/04/22 0820   sodium bicarbonate tablet 650 mg, 650 mg, Oral, TID, Alanda Slim, Taye T, MD, 650 mg at 09/04/22 0820   traZODone (DESYREL) tablet 50 mg, 50 mg, Oral, QHS PRN, Candelaria Stagers T, MD, 50 mg at 09/01/22 0008  Allergies: Allergies  Allergen Reactions   Bee Venom Anaphylaxis   Coconut Flavor Anaphylaxis and Rash   Fish Allergy Anaphylaxis   Geodon [Ziprasidone Hcl] Other (See Comments)    Pt states that this medication causes paralysis of the mouth.     Haloperidol And Related Other (See Comments)    Pt states that this medication causes paralysis of the mouth, jaw locks up   Lithobid [Lithium] Other (  See Comments)    Seizure-like activity    Roxicodone [Oxycodone] Other (See Comments)    Hallucinations    Seroquel [Quetiapine] Other (See Comments)    Severe drowsiness - Pt currently taking 12.5 mg BID, she is ok with this dose   Shellfish Allergy Anaphylaxis   Phenergan [Promethazine Hcl] Other (See Comments)    Chest pain     Prilosec [Omeprazole] Nausea And Vomiting and Other (See Comments)    Pt can take protonix with no problems    Sulfa Antibiotics Other (See Comments)    Chest pain    Tegretol [Carbamazepine] Nausea And Vomiting   Prozac [Fluoxetine] Other (See Comments)    Increased Depression and Suicidal thoughts   Tape Other (See Comments)    Skin tears, can only tolerate paper tape.   Tylenol [Acetaminophen] Rash and Other (See Comments)    Rash on face

## 2022-09-04 NOTE — Plan of Care (Signed)
  Problem: Clinical Measurements: Goal: Will remain free from infection Outcome: Progressing   Problem: Activity: Goal: Risk for activity intolerance will decrease Outcome: Progressing   Problem: Safety: Goal: Ability to remain free from injury will improve Outcome: Progressing   Problem: Coping: Goal: Level of anxiety will decrease Outcome: Not Progressing

## 2022-09-04 NOTE — Consult Note (Signed)
Brief Psychiatry Consult Note  I spoke to pt's nurse who was called into room by sitter after pt became upset with [facility] worker (pt has allegedly been accused of harming her daughter). Made some comments about not deserving to eat. I do not believe we can reasonably restrict DSS access, but it is the strong recommendation of the psychiatry team that these visits are planned/disclosed in advance, happen with the knowledge of the psychiatry team during the daytime, and are coordinated with same such that she can be supported through or after these visits.   -- approved OTO PRN dose of lorazepam -- will f/u tomorrow -- if pt is not put on [facility] priority wait list, will call number in SW note to appeal.   Claris Che A Siddalee Vanderheiden

## 2022-09-04 NOTE — Plan of Care (Signed)
Patient is calm and cooperative throughout the shift. Able to tell the sitter and this nurse when she feels anxious. Ativan PRN was given with positive effect. Pt took a shower, read few bible verses out loud and had a restful night.  Problem: Education: Goal: Knowledge of General Education information will improve Description: Including pain rating scale, medication(s)/side effects and non-pharmacologic comfort measures Outcome: Progressing   Problem: Clinical Measurements: Goal: Ability to maintain clinical measurements within normal limits will improve Outcome: Progressing   Problem: Coping: Goal: Level of anxiety will decrease Outcome: Progressing   Problem: Health Behavior/Discharge Planning: Goal: Ability to manage health-related needs will improve Outcome: Progressing

## 2022-09-05 DIAGNOSIS — F315 Bipolar disorder, current episode depressed, severe, with psychotic features: Secondary | ICD-10-CM | POA: Diagnosis not present

## 2022-09-05 NOTE — Consult Note (Addendum)
[name] Consult Evaluation  Service Date: September 05, 2022 LOS:  LOS: 14 days    Primary Psychiatric Diagnoses  Schizoaffective disorder, bipolar type  GAD  Assessment  Sicilia Twiggs is a 32 y.o. female with a past psychiatric history of borderline personality disorder, schizoaffective disorder bipolar type, and PTSD.  She has had numerous behavioral health admissions and an extensive history of suicide attempts.  Patient presents to [facility] ED for an attempted overdose on her prescription medications.  During this hospitalization, patient has required restraints due to increased agitation, disruptive behavior, and increased risk for harming herself. Patient reports command hallucinations, telling her to harm herself and others. She has had multiple suicide attempts this past year and has multiple risk factors, including severe mood instability, impulsivity, and significant interpersonal difficulties, all of which contribute to an increased risk of suicide. Although adequate safety measures remain in place, it is important to acknowledge the behavioral component to patient's disruptive behavior on the unit, consistent with her diagnosis of BPD. There are inconsistencies in patient's report when seen by different members of the team.   7/31: Discussed challenges in patient dispositional options at Care Team Meeting , patient has exhausted her options at most behavioral health facilities.  We continue to recommend placement at [facility], patient remains on waitlist.  Behavior plan has been successful in preventing further escalation of behavior and restraints. Her IVC renewal has been submitted today.    Diagnoses:  Active Hospital problems: Principal Problem:   Bipolar I disorder, current or most recent episode depressed, with psychotic features (HCC) Active Problems:   Asthma   GAD (generalized anxiety disorder)   Suicide attempt (HCC)   Overdose   Drug overdose     Plan    ## Psychiatric Medication Recommendations:  -- Continue Cymbalta 60 mg daily  -- Continue olanzapine 10mg  po daily at bedtime for psychosis. -- Continue Gabapentin 600 mg TID for agitated mood/anxiety -- Continue Olanzapine 10mg  IM daily a single dose, which appeared to be effective at this time. Patient continues to benefit from limit setting and boundaries as she can split staff.    ## Medical Decision Making Capacity:   Capacity was not formally addressed during this encounter  ## Further Work-up:  -- Per primary -- most recent EKG on 08/30/2022 had QtC of 469   ## Disposition:  -- We recommend inpatient psychiatric hospitalization after medical hospitalization. Patient has been involuntarily committed on 09/04/2022.   ## Behavioral / Environmental:  --  To minimize splitting of staff, assign one staff person to communicate all information from the team when feasible. -- Apply the established behavioral rules and protocols consistently throughout the night (see nursing care order for details). Ensure that all staff members are aware of and adhere to the same guidelines to avoid confusion and mixed messages.   ## Safety and Observation Level:  - Based on my clinical evaluation, I estimate the patient to be at elevated risk of self harm in the current setting - At this time, we recommend continuing a 1: 1 level of observation. This decision is based on my review of the chart including patient's history and current presentation, interview of the patient, mental status examination, and consideration of suicide risk including evaluating suicidal ideation, plan, intent, suicidal or self-harm behaviors, risk factors, and protective factors. This judgment is based on our ability to directly address suicide risk, implement suicide prevention strategies and develop a safety plan while the patient is in  the clinical setting. Please contact our team if there is a concern that risk level has  changed.  Suicide risk assessment  Patient has following modifiable risk factors for suicide: active suicidal ideation and recklessness, which we are addressing by continuing adequate safety precautions while she is hospitalized.   Patient has following non-modifiable or demographic risk factors for suicide: history of suicide attempt, history of self harm behavior, and psychiatric hospitalization  Patient has the following protective factors against suicide: Access to outpatient mental health care   Thank you for this consult request. Recommendations have been communicated to the primary team.  We will follow at this time.   Lorri Frederick, MD  Psychiatric and Social History   Relevant Aspects of Hospital Course:  Admitted on 08/22/2022 for suicide attempt by overdose on her prescription medication.   Patient Report:  Patient seen this AM.  Reports she has benefited from contact with friends via phone calls and walks on the unit.  However continues to report depressed mood and auditory hallucinations.  She reports that her suicidal ideations and homicidal ideations are present but passive, without intent or plan.  She contracts for safety on the unit.  Patient appears withdrawn, participates minimally during the interview today.   Psychiatric ROS Mood Symptoms Reports increased irritability and homicidal ideations.  She also reports low mood and suicidal ideations. Manic Symptoms Not formally assessed today Anxiety Symptoms Denies any symptoms of anxiety Trauma Symptoms Not formally assessed today.  Has history of PTSD diagnosed in 2016. Psychosis Symptoms Reports command AH telling her to bang her head against something in her room.  Denies paranoid ideations.  Past Psychiatric History: Previous Psych Diagnoses: Schizoaffective disorder bipolar type, Anxiety, Autism spectrum disorder, PTSD, Borderline personality disorder Prior inpatient treatment: multiple, Admitted to  [facility]  on 07/20/22, has been to [facility], and was discharged from inpatient facility past week. Current/prior outpatient treatment: [facility] Prior rehab hx: denies multiple Psychotherapy hx: [facility] History of suicide: 20-30 times by overdosing on pills, cutting or drinking 409 disinfectant History of homicide or aggression: denies Psychiatric medication history:  Previously trialed Tegretol, Haldol, Clozaril, Depakote, Invega, Lamictal, Seroquel, asenapine, abilify, vraylar, Lexapro, Zoloft  Neuromodulation history: denies  Social History:  Marital status: Widowed Children: daughter Lives: with boyfriend, in two story home Employment: Disability Tobacco: quit smoking; smoked for two years.  Access to weapons: None   Exam Findings   Psychiatric Specialty Exam:  Presentation  General Appearance:  Appropriate for Environment; Casual  Eye Contact: Good  Speech: Clear and Coherent; Normal Rate  Speech Volume: Normal  Handedness: Right   Mood and Affect  Mood: -- ("Not good")  Affect: Flat   Thought Process  Thought Processes: Linear; Coherent; Goal Directed  Descriptions of Associations: Intact  Orientation: Full (Time, Place and Person)  Thought Content: Logical  History of Schizophrenia/Schizoaffective disorder: Yes  Duration of Psychotic Symptoms: Greater than six months  Hallucinations:Hallucinations: Auditory Description of Command Hallucinations: Denies Description of Auditory Hallucinations: Does not specify today  Ideas of Reference: None  Suicidal Thoughts:Suicidal Thoughts: Yes, Passive SI Active Intent and/or Plan: Without Intent; Without Plan SI Passive Intent and/or Plan: Without Intent; Without Plan  Homicidal Thoughts:Homicidal Thoughts: No HI Active Intent and/or Plan: Without Intent; Without Plan   Sensorium  Memory: Immediate Fair; Recent Fair; Remote  Fair  Judgment: Poor  Insight: Shallow   Executive Functions  Concentration: Fair  Attention Span: Fair  Recall: [employer] of Knowledge: Fair  Language: Fair  Psychomotor Activity  Psychomotor Activity:Psychomotor Activity: Normal   Assets  Assets: Communication Skills; Desire for Improvement   Sleep  Sleep:Sleep: Fair    Physical Exam: Vital signs:  Temp:  [98.1 F (36.7 C)-98.4 F (36.9 C)] 98.1 F (36.7 C) (07/31 0814) Pulse Rate:  [96-101] 96 (07/31 0814) Resp:  [16-18] 16 (07/31 0814) BP: (109-124)/(57-74) 109/57 (07/31 0814) SpO2:  [98 %-99 %] 99 % (07/31 9562) Physical Exam Vitals and nursing note reviewed. Exam conducted with a chaperone present.  Constitutional:      General: She is not in acute distress.    Appearance: She is not ill-appearing.  Pulmonary:     Effort: Pulmonary effort is normal. No respiratory distress.  Skin:    General: Skin is warm and dry.  Neurological:     General: No focal deficit present.     Mental Status: She is alert.  Psychiatric:        Attention and Perception: Attention and perception normal.        Mood and Affect: Affect is flat.        Speech: Speech normal.        Behavior: Behavior is not aggressive or combative.        Thought Content: Thought content includes homicidal and suicidal ideation. Thought content includes homicidal and suicidal plan.        Judgment: Judgment is impulsive.     Blood pressure (!) 109/57, pulse 96, temperature 98.1 F (36.7 C), temperature source Oral, resp. rate 16, height 5\' 5"  (1.651 m), weight 98.2 kg, last menstrual period 08/08/2022, SpO2 99%. Body mass index is 36.03 kg/m.   Other History   These have been pulled in through the EMR, reviewed, and updated if appropriate.   Family History:  The patient's family history includes Asthma in her father; Mental illness in her father; PDD in her brother; Seizures in her brother.  Medical History: Past Medical  History:  Diagnosis Date   Acid reflux    Adjustment disorder with mixed anxiety and depressed mood 01/31/2022   Adjustment disorder with mixed disturbance of emotions and conduct 08/03/2019   Anxiety    Asthma    last attack 03/13/15 or 03/14/15   Autism    Bipolar 1 disorder, depressed, severe (HCC) 07/25/2021   Carrier of fragile X syndrome    Chronic constipation    Depression    Drug-seeking behavior    Essential tremor    Headache    Ineffective individual coping 05/16/2022   Insomnia 01/12/2022   Intentional drug overdose (HCC) 06/05/2022   Neuromuscular disorder (HCC)    Normocytic anemia 06/05/2022   Overdose 07/22/2017   Overdose of acetaminophen 07/2017   and other meds   Overdose, intentional self-harm, initial encounter (HCC) 07/20/2021   Paranoia (HCC) 04/22/2021   Personality disorder (HCC)    Purposeful non-suicidal drug ingestion (HCC) 06/27/2021   Schizo-affective psychosis (HCC)    Schizoaffective disorder (HCC) 07/29/2022   Schizoaffective disorder, bipolar type (HCC)    Seizures (HCC)    Last seizure December 2017   Skin erythema 04/27/2022   Sleep apnea    Suicidal behavior 07/25/2021   Suicidal ideation    Suicide (HCC) 07/01/2021   Suicide attempt (HCC) 07/04/2021    Surgical History: Past Surgical History:  Procedure Laterality Date   MOUTH SURGERY  2009 or 2010    Medications:   Current Facility-Administered Medications:    0.9 %  sodium chloride infusion, 250 mL, Intravenous, Continuous, Gonfa,  Boyce Medici, MD, Stopped at 08/24/22 1347   albuterol (PROVENTIL) (2.5 MG/3ML) 0.083% nebulizer solution 3 mL, 3 mL, Inhalation, Q6H PRN, Alanda Slim, Taye T, MD   benztropine (COGENTIN) tablet 1 mg, 1 mg, Oral, QHS, Gonfa, Taye T, MD, 1 mg at 09/04/22 2130   cyclobenzaprine (FLEXERIL) tablet 10 mg, 10 mg, Oral, QHS, Gonfa, Taye T, MD, 10 mg at 09/04/22 2130   diclofenac Sodium (VOLTAREN) 1 % topical gel 2 g, 2 g, Topical, QID, Gonfa, Taye T, MD, 2 g at  09/05/22 1407   DULoxetine (CYMBALTA) DR capsule 60 mg, 60 mg, Oral, Daily, Akintayo, Mojeed, MD, 60 mg at 09/05/22 0806   ferrous sulfate tablet 325 mg, 325 mg, Oral, BID WC, Gonfa, Taye T, MD, 325 mg at 09/05/22 1617   gabapentin (NEURONTIN) capsule 600 mg, 600 mg, Oral, TID, Alanda Slim, Taye T, MD, 600 mg at 09/05/22 1617   hydrOXYzine (ATARAX) tablet 25 mg, 25 mg, Oral, TID PRN, Candelaria Stagers T, MD, 25 mg at 09/05/22 1219   ibuprofen (ADVIL) tablet 600 mg, 600 mg, Oral, TID PRN, Candelaria Stagers T, MD, 600 mg at 09/04/22 1939   LORazepam (ATIVAN) tablet 0.5 mg, 0.5 mg, Oral, BID PRN, Alanda Slim, Taye T, MD, 0.5 mg at 09/05/22 1407   LORazepam (ATIVAN) tablet 0.5 mg, 0.5 mg, Oral, Once PRN, Cinderella, Margaret A   mometasone-formoterol (DULERA) 200-5 MCG/ACT inhaler 2 puff, 2 puff, Inhalation, BID, Candelaria Stagers T, MD, 2 puff at 09/05/22 0807   mupirocin cream (BACTROBAN) 2 %, , Topical, Daily, Marguerita Merles Faxon, DO, Given at 09/05/22 0809   OLANZapine (ZYPREXA) injection 10 mg, 10 mg, Intramuscular, Once PRN, Akintayo, Mojeed, MD   OLANZapine (ZYPREXA) tablet 10 mg, 10 mg, Oral, QHS, Gonfa, Taye T, MD, 10 mg at 09/04/22 2130   ondansetron (ZOFRAN-ODT) disintegrating tablet 4 mg, 4 mg, Oral, Q8H PRN, Sheikh, Omair Latif, DO, 4 mg at 09/01/22 2025   Oral care mouth rinse, 15 mL, Mouth Rinse, PRN, Gonfa, Taye T, MD   pantoprazole (PROTONIX) EC tablet 40 mg, 40 mg, Oral, QAC breakfast, Gonfa, Taye T, MD, 40 mg at 09/05/22 0806   polyethylene glycol (MIRALAX / GLYCOLAX) packet 17 g, 17 g, Oral, BID PRN, Alanda Slim, Taye T, MD, 17 g at 09/05/22 0851   prazosin (MINIPRESS) capsule 1 mg, 1 mg, Oral, QHS, Gonfa, Taye T, MD, 1 mg at 09/04/22 2129   risperiDONE (RISPERDAL M-TABS) disintegrating tablet 1 mg, 1 mg, Oral, BID PRN, Starkes-Perry, Juel Burrow, FNP   senna-docusate (Senokot-S) tablet 1 tablet, 1 tablet, Oral, BID PRN, Candelaria Stagers T, MD, 1 tablet at 09/05/22 0851   sodium bicarbonate tablet 650 mg, 650 mg, Oral, TID,  Alanda Slim, Taye T, MD, 650 mg at 09/05/22 1624   traZODone (DESYREL) tablet 50 mg, 50 mg, Oral, QHS PRN, Candelaria Stagers T, MD, 50 mg at 09/01/22 0008  Allergies: Allergies  Allergen Reactions   Bee Venom Anaphylaxis   Coconut Flavor Anaphylaxis and Rash   Fish Allergy Anaphylaxis   Geodon [Ziprasidone Hcl] Other (See Comments)    Pt states that this medication causes paralysis of the mouth.     Haloperidol And Related Other (See Comments)    Pt states that this medication causes paralysis of the mouth, jaw locks up   Lithobid [Lithium] Other (See Comments)    Seizure-like activity    Roxicodone [Oxycodone] Other (See Comments)    Hallucinations    Seroquel [Quetiapine] Other (See Comments)    Severe drowsiness - Pt currently  taking 12.5 mg BID, she is ok with this dose   Shellfish Allergy Anaphylaxis   Phenergan [Promethazine Hcl] Other (See Comments)    Chest pain     Prilosec [Omeprazole] Nausea And Vomiting and Other (See Comments)    Pt can take protonix with no problems    Sulfa Antibiotics Other (See Comments)    Chest pain    Tegretol [Carbamazepine] Nausea And Vomiting   Prozac [Fluoxetine] Other (See Comments)    Increased Depression and Suicidal thoughts   Tape Other (See Comments)    Skin tears, can only tolerate paper tape.   Tylenol [Acetaminophen] Rash and Other (See Comments)    Rash on face

## 2022-09-05 NOTE — Progress Notes (Signed)
[facility] PROGRESS NOTE   [name] ZOX:[id number] DOB: Jun 10, 1990 DOA: 08/22/2022  PCP: [name]  Brief History/Interval Summary: 32 y.o. F with PMH of adjustment disorder with mixed anxiety and depressed mood, autism, bipolar 1 disorder, fragile x syndrome, seizures not on meds, schizoaffective disorder and multiple suicide attempts in the last year who presented to the ED after ingesting numerous medications in a suicide attempt.  She reported taking #30 metoprolol 25mg  tablets, #60 Hydroxyzine 25mg , #7 Lexapro 20mg , #21 Gabapentin 600mg , #7 Flexeril 10mg , #7 protonix 40mg  and #7 Prazosin 1mg .   She was slightly bradycardic and hypotensive on EMS arrival which improved with fluids.  Poison control was contacted and as she was within 2hrs of ingestion was given activated charcoal.  Placed under IVC in ED.  Poison control also recommended ICU admission for close Qtc and electrolyte monitoring and possible pressors.  At the time of admission, pt was somnolent but arousable to voice and following commands. She was intubated and started on vasopressor and glucagon.  Also started on ceftriaxone for possible pneumonia.  Patient was extubated on 7/18 and transferred to the floor on 7/19. She was transferred to [facility] service on 08/25/22.  Psychiatry continues to follow.  They recommend inpatient psychiatric hospitalization.   Consultants: Psychiatry     Subjective/Interval History: Denies any complaints such as headache nausea vomiting abdominal pain shortness of breath.    Assessment/Plan:  Suicidal Attempt with intentional overdose:  -Overdose with multiple medications including #30 metoprolol 25mg  tablets, #60 Hydroxyzine 25mg , #7 Lexapro 20mg , #21 Gabapentin 600mg , #7 Flexeril 10mg , #7 protonix 40mg  and #7 Prazosin 1mg .  Poison control consulted. Received charcoal. Denies ETOH or illicit drug use.  Extended UDS negative. -Required vasopressor, glucagon,  intubation and mechanical ventilation 7/17-7/18.   Currently she is involuntarily committed. Psychiatry is following closely.   Anxiety and Depression, Schizoaffective Disorder, Adjustment Disorder, bipolar disorder, fibromyalgia and auditory hallucination:  Patient noted to be on Cogentin, Cymbalta, Neurontin, Zyprexa, prazosin. Psychiatry follows closely. She was on Lexapro prior to admission which has been changed over to Cymbalta. -Patient started hearing voices in her head that were "too much for her to handle" and then she threatened "that she was in a kill and strangle one of the staff members" -Intermittently has to be placed in 4-point restraints and given IM Zyprexa.  She still has a one-to-one sitter at bedside and will continue -Cymbalta has been increased from 30 mg to 60 mg daily and will be continuing olanzapine 10 mg p.o. daily nightly for psychosis and continuing gabapentin 600 mg 3 times daily for agitated mood/anxiety -Continues to Endorse Suicidal Thoughts and Ideation   CAP Completed course of antibiotics.  Respiratory status is stable.   Essential hypertension  -Normotensive. Was apparently on metoprolol prior to admission which is currently on hold.   Sinus Tachycardia -Resolved.   Metabolic Acidosis -Improved.  Noted to be on sodium bicarbonate.   Hypomagnesemia Monitor periodically.   Microcytic Anemia Hemoglobin has been stable.  No overt bleeding noted. -P.o. ferrous sulfate with bowel regimen initiated at 325 mg po BID   History of Seizure Disorder -Stable.  Not on meds.   History of Asthma -Stable.   GERD/GI Prophylaxis -Continue PPI with Pantoprazole and Famotidine 20 mg po BID   Obesity -Complicates overall prognosis and care -Estimated body mass index is 36.03 kg/m as calculated from the following:   Height as of this encounter: 5\' 5"  (1.651 m).   Weight as of  this encounter: 98.2 kg.  -Weight Loss and Dietary Counseling given   DVT  prophylaxis: SCDs  Code Status: Full Code Family Communication: No family present at bedside  Disposition Plan: Awaiting inpatient psychiatric hospitalization.    Medications: Scheduled:  benztropine  1 mg Oral QHS   cyclobenzaprine  10 mg Oral QHS   diclofenac Sodium  2 g Topical QID   DULoxetine  60 mg Oral Daily   ferrous sulfate  325 mg Oral BID WC   gabapentin  600 mg Oral TID   mometasone-formoterol  2 puff Inhalation BID   mupirocin cream   Topical Daily   OLANZapine  10 mg Oral QHS   pantoprazole  40 mg Oral QAC breakfast   prazosin  1 mg Oral QHS   sodium bicarbonate  650 mg Oral TID   Continuous:  sodium chloride Stopped (08/24/22 1347)   ZOX:[id number], hydrOXYzine, ibuprofen, LORazepam, LORazepam, OLANZapine, ondansetron, mouth rinse, polyethylene glycol, risperiDONE, senna-docusate, traZODone  Antibiotics: Anti-infectives (From admission, onward)    Start     Dose/Rate Route Frequency Ordered Stop   08/26/22 1415  amoxicillin-clavulanate (AUGMENTIN) 875-125 MG per tablet 1 tablet        1 tablet Oral Every 12 hours 08/26/22 1316 08/27/22 2205   08/23/22 1200  cefTRIAXone (ROCEPHIN) 2 g in sodium chloride 0.9 % 100 mL IVPB  Status:  Discontinued        2 g 200 mL/hr over 30 Minutes Intravenous Every 24 hours 08/23/22 0931 08/26/22 1316       Objective:  Vital Signs  Vitals:   09/04/22 0805 09/04/22 1426 09/05/22 0506 09/05/22 0814  BP: 111/68 122/72 124/74 (!) 109/57  Pulse: 100 95 (!) 101 96  Resp: 16 17 18 16   Temp: 98.3 F (36.8 C) 98.1 F (36.7 C) 98.4 F (36.9 C) 98.1 F (36.7 C)  TempSrc: Oral Oral Oral Oral  SpO2: 99% 99% 98% 99%  Weight:      Height:        Intake/Output Summary (Last 24 hours) at 09/05/2022 0947 Last data filed at 09/04/2022 2200 Gross per 24 hour  Intake 1120 ml  Output --  Net 1120 ml   Filed Weights   09/01/22 0300 09/02/22 0500 09/03/22 0402  Weight: 98.8 kg 99 kg 98.2 kg    General appearance: Awake alert.   In no distress Resp: Clear to auscultation bilaterally.  Normal effort Cardio: S1-S2 is normal regular.  No S3-S4.  No rubs murmurs or bruit GI: Abdomen is soft.  Nontender nondistended.  Bowel sounds are present normal.  No masses organomegaly Extremities: No edema.  Full range of motion of lower extremities. Neurologic: Alert and oriented x3.  No focal neurological deficits.    Lab Results:  Data Reviewed: I have personally reviewed following labs and reports of the imaging studies  CBC: No results for input(s): "WBC", "NEUTROABS", "HGB", "HCT", "MCV", "PLT" in the last 168 hours.  Basic Metabolic Panel: Recent Labs  Lab 09/05/22 0816  CREATININE 0.78    GFR: Estimated Creatinine Clearance: 116.1 mL/min (by C-G formula based on SCr of 0.78 mg/dL).  Liver Function Tests: No results for input(s): "AST", "ALT", "ALKPHOS", "BILITOT", "PROT", "ALBUMIN" in the last 168 hours.    Radiology Studies: No results found.     LOS: 14 days   Avin Upperman [employer] on [email or web address]  09/05/2022, 9:47 AM

## 2022-09-05 NOTE — Plan of Care (Addendum)
Pt is calm and cooperative throughout the shift, took a shower and had restful night. Expressed disappointment that she's still on the waiting list for [facility] and that 'no medicine' is really helping with her mental issues.   Problem: Education: Goal: Knowledge of General Education information will improve Description: Including pain rating scale, medication(s)/side effects and non-pharmacologic comfort measures Outcome: Progressing   Problem: Health Behavior/Discharge Planning: Goal: Ability to manage health-related needs will improve Outcome: Progressing   Problem: Coping: Goal: Level of anxiety will decrease Outcome: Progressing  Problem: Pain Managment: Goal: General experience of comfort will improve Outcome: Progressing   Problem: Safety: Goal: Ability to remain free from injury will improve Outcome: Progressing

## 2022-09-05 NOTE — TOC Progression Note (Signed)
Transition of Care [facility]) - Progression Note    Patient Details  Name: [name] MRN: [id number] Date of Birth: 10-25-1990  Transition of Care [facility]) CM/SW Contact  Lorri Frederick, LCSW Phone Number: 09/05/2022, 3:48 PM  Clinical Narrative:    IVC paperwork submitted to magistrate through efile, custody order received and placed on chart with IVC paperwork.  [name] contacted and will send Emergency planning/management officer for service.  CSW attended team meeting with multiple providers from community and inpatient setting.  Goal of [facility] at [facility] was discussed.  Additional notes were again faxed to [facility] to support pt being moved to priority wait list.          Expected Discharge Plan and Services                                               Social Determinants of Health (SDOH) Interventions SDOH Screenings   Food Insecurity: No Food Insecurity (07/29/2022)  Housing: Low Risk  (07/29/2022)  Transportation Needs: No Transportation Needs (07/29/2022)  Recent Concern: Transportation Needs - Unmet Transportation Needs (05/06/2022)  Utilities: Not At Risk (07/29/2022)  Alcohol Screen: Low Risk  (07/29/2022)  Depression (PHQ2-9): Medium Risk (03/10/2022)  Tobacco Use: Medium Risk (08/22/2022)    Readmission Risk Interventions    06/06/2022    3:38 PM  Readmission Risk Prevention Plan  Transportation Screening Complete  Medication Review (RN Care Manager) Complete  PCP or Specialist appointment within 3-5 days of discharge Complete  HRI or Home Care Consult Complete  SW Recovery Care/Counseling Consult Complete  Palliative Care Screening Not Applicable  Skilled Nursing Facility Not Applicable

## 2022-09-06 DIAGNOSIS — F315 Bipolar disorder, current episode depressed, severe, with psychotic features: Secondary | ICD-10-CM | POA: Diagnosis not present

## 2022-09-06 MED ORDER — ORAL CARE MOUTH RINSE: 15.0000 mL | OROMUCOSAL | Status: DC | PRN

## 2022-09-06 NOTE — Progress Notes (Signed)
[facility] PROGRESS NOTE   [name] ZOX:[id number] DOB: [birth year] DOA: 08/22/2022  PCP: [name]  Brief History/Interval Summary: 32 y.o. F with PMH of adjustment disorder with mixed anxiety and depressed mood, autism, bipolar 1 disorder, fragile x syndrome, seizures not on meds, schizoaffective disorder and multiple suicide attempts in the last year who presented to the ED after ingesting numerous medications in a suicide attempt.  She reported taking #30 metoprolol 25mg  tablets, #60 Hydroxyzine 25mg , #7 Lexapro 20mg , #21 Gabapentin 600mg , #7 Flexeril 10mg , #7 protonix 40mg  and #7 Prazosin 1mg .   She was slightly bradycardic and hypotensive on EMS arrival which improved with fluids.  Poison control was contacted and as she was within 2hrs of ingestion was given activated charcoal.  Placed under IVC in ED.  Poison control also recommended ICU admission for close Qtc and electrolyte monitoring and possible pressors.  At the time of admission, pt was somnolent but arousable to voice and following commands. She was intubated and started on vasopressor and glucagon.  Also started on ceftriaxone for possible pneumonia.  Patient was extubated on 7/18 and transferred to the floor on 7/19. She was transferred to [facility] service on 08/25/22.  Psychiatry continues to follow.  They recommend inpatient psychiatric hospitalization.   Consultants: Psychiatry     Subjective/Interval History: Patient is lying on the bed.  Does not appear to be in any discomfort.  Denies any complaints this morning.  Overnight events of agitation noted.    Assessment/Plan:  Suicidal Attempt with intentional overdose:  -Overdose with multiple medications including #30 metoprolol 25mg  tablets, #60 Hydroxyzine 25mg , #7 Lexapro 20mg , #21 Gabapentin 600mg , #7 Flexeril 10mg , #7 protonix 40mg  and #7 Prazosin 1mg .  Poison control consulted. Received charcoal. Denies ETOH or illicit drug use.  Extended  UDS negative. -Required vasopressor, glucagon, intubation and mechanical ventilation 7/17-7/18.   Currently she is involuntarily committed. Psychiatry is following closely.   Anxiety and Depression, Schizoaffective Disorder, Adjustment Disorder, bipolar disorder, fibromyalgia and auditory hallucination:  Patient noted to be on Cogentin, Cymbalta, Neurontin, Zyprexa, prazosin. Psychiatry follows closely. She was on Lexapro prior to admission which has been changed over to Cymbalta. -Patient started hearing voices in her head that were "too much for her to handle" and then she threatened "that she was in a kill and strangle one of the staff members" -Intermittently has to be placed in 4-point restraints and given IM Zyprexa.  She still has a one-to-one sitter at bedside and will continue -Cymbalta has been increased from 30 mg to 60 mg daily and will be continuing olanzapine 10 mg p.o. daily nightly for psychosis and continuing gabapentin 600 mg 3 times daily for agitated mood/anxiety Overnight episodes of agitation noted.  Await placement to inpatient psychiatric facility.   CAP Completed course of antibiotics.  Respiratory status is stable.   Essential hypertension  Normotensive. Was apparently on metoprolol prior to admission which is currently on hold. Electrolytes have not been checked in about a week.  Will order for tomorrow morning.   Sinus Tachycardia -Resolved.   Metabolic Acidosis -Improved.  Noted to be on sodium bicarbonate.   Hypomagnesemia Monitor periodically.   Microcytic Anemia Hemoglobin has been stable.  No overt bleeding noted.  Order CBC for tomorrow morning. -P.o. ferrous sulfate with bowel regimen initiated at 325 mg po BID   History of Seizure Disorder -Stable.  Not on meds.   History of Asthma -Stable.   GERD/GI Prophylaxis -Continue PPI with Pantoprazole and Famotidine  20 mg po BID   Obesity -Complicates overall prognosis and care -Estimated body  mass index is 36.03 kg/m as calculated from the following:   Height as of this encounter: 5\' 5"  (1.651 m).   Weight as of this encounter: 98.2 kg.  -Weight Loss and Dietary Counseling given   DVT prophylaxis: SCDs  Code Status: Full Code Family Communication: No family present at bedside  Disposition Plan: Awaiting inpatient psychiatric hospitalization.    Medications: Scheduled:  benztropine  1 mg Oral QHS   cyclobenzaprine  10 mg Oral QHS   diclofenac Sodium  2 g Topical QID   DULoxetine  60 mg Oral Daily   ferrous sulfate  325 mg Oral BID WC   gabapentin  600 mg Oral TID   mometasone-formoterol  2 puff Inhalation BID   mupirocin cream   Topical Daily   OLANZapine  10 mg Oral QHS   pantoprazole  40 mg Oral QAC breakfast   prazosin  1 mg Oral QHS   sodium bicarbonate  650 mg Oral TID   Continuous:  sodium chloride Stopped (08/24/22 1347)   UJW:JXBJYNWGN, hydrOXYzine, ibuprofen, LORazepam, LORazepam, OLANZapine, ondansetron, mouth rinse, polyethylene glycol, risperiDONE, senna-docusate, traZODone  Antibiotics: Anti-infectives (From admission, onward)    Start     Dose/Rate Route Frequency Ordered Stop   08/26/22 1415  amoxicillin-clavulanate (AUGMENTIN) 875-125 MG per tablet 1 tablet        1 tablet Oral Every 12 hours 08/26/22 1316 08/27/22 2205   08/23/22 1200  cefTRIAXone (ROCEPHIN) 2 g in sodium chloride 0.9 % 100 mL IVPB  Status:  Discontinued        2 g 200 mL/hr over 30 Minutes Intravenous Every 24 hours 08/23/22 0931 08/26/22 1316       Objective:  Vital Signs  Vitals:   09/05/22 2138 09/06/22 0856 09/06/22 0904 09/06/22 0931  BP: (!) 108/56  126/70 119/66  Pulse:   (!) 116 (!) 103  Resp: 20  17 18   Temp: 98.6 F (37 C)  98.3 F (36.8 C) 98.3 F (36.8 C)  TempSrc: Oral  Oral Oral  SpO2: 100% 98% 98% 99%  Weight:      Height:        Intake/Output Summary (Last 24 hours) at 09/06/2022 1108 Last data filed at 09/06/2022 0721 Gross per 24 hour   Intake 740 ml  Output --  Net 740 ml   Filed Weights   09/01/22 0300 09/02/22 0500 09/03/22 0402  Weight: 98.8 kg 99 kg 98.2 kg    Patient reluctant to be examined today.  Vital signs noted to be stable.  Lab Results:  Data Reviewed: I have personally reviewed following labs and reports of the imaging studies  CBC: No results for input(s): "WBC", "NEUTROABS", "HGB", "HCT", "MCV", "PLT" in the last 168 hours.  Basic Metabolic Panel: Recent Labs  Lab 09/05/22 0816  CREATININE 0.78    GFR: Estimated Creatinine Clearance: 116.1 mL/min (by C-G formula based on SCr of 0.78 mg/dL).  Liver Function Tests: No results for input(s): "AST", "ALT", "ALKPHOS", "BILITOT", "PROT", "ALBUMIN" in the last 168 hours.    Radiology Studies: No results found.     LOS: 15 days   Luddie Boghosian [employer] on [email or web address]  09/06/2022, 11:08 AM

## 2022-09-06 NOTE — Plan of Care (Signed)
  Problem: Education: Goal: Knowledge of General Education information will improve Description: Including pain rating scale, medication(s)/side effects and non-pharmacologic comfort measures Outcome: Progressing   Problem: Health Behavior/Discharge Planning: Goal: Ability to manage health-related needs will improve Outcome: Progressing   Problem: Clinical Measurements: Goal: Ability to maintain clinical measurements within normal limits will improve Outcome: Progressing Goal: Will remain free from infection Outcome: Progressing Goal: Diagnostic test results will improve Outcome: Progressing Goal: Cardiovascular complication will be avoided Outcome: Progressing   Problem: Activity: Goal: Risk for activity intolerance will decrease Outcome: Progressing   Problem: Coping: Goal: Level of anxiety will decrease Outcome: Progressing   Problem: Elimination: Goal: Will not experience complications related to bowel motility Outcome: Progressing   Problem: Pain Managment: Goal: General experience of comfort will improve Outcome: Progressing   Problem: Safety: Goal: Ability to remain free from injury will improve Outcome: Progressing   Problem: Skin Integrity: Goal: Risk for impaired skin integrity will decrease Outcome: Progressing   Problem: Role Relationship: Goal: Method of communication will improve Outcome: Progressing   Problem: Coping: Goal: Ability to adjust to condition or change in health will improve Outcome: Progressing   Problem: Fluid Volume: Goal: Ability to maintain a balanced intake and output will improve Outcome: Progressing   Problem: Health Behavior/Discharge Planning: Goal: Ability to identify and utilize available resources and services will improve Outcome: Progressing Goal: Ability to manage health-related needs will improve Outcome: Progressing   Problem: Metabolic: Goal: Ability to maintain appropriate glucose levels will  improve Outcome: Progressing   Problem: Nutritional: Goal: Maintenance of adequate nutrition will improve Outcome: Progressing Goal: Progress toward achieving an optimal weight will improve Outcome: Progressing   Problem: Skin Integrity: Goal: Risk for impaired skin integrity will decrease Outcome: Progressing   Problem: Tissue Perfusion: Goal: Adequacy of tissue perfusion will improve Outcome: Progressing

## 2022-09-06 NOTE — Progress Notes (Signed)
   09/06/22 1038  Spiritual Encounters  Type of Visit Initial  Care provided to: Patient  Conversation partners present during encounter Nurse  Reason for visit Routine spiritual support    Reason For Visit:    Chaplain f/u with Pt after a visit earlier in the week. Also, at the end of the day yesterday the Pt's RN paged and stated Pt would like to see me again.  Interventions:     Explored Hope as well as Pt relational needs and resources; Listened with reflection and empathy; explored spiritual needs and resources; Explored Pt's goals  Outcomes:     Expressed intermediate [facility];  Identified coping tools; progressed toward understanding her relationship with family; Verbally processed emotions  Assessment:      Needs Pt. lacks purpose, experiencing anger and hurt, and is in conflict with her immediate family Resources Pt possesses courage to move forward, sometimes utilizes her faith for self-care; and take great comfort from a close friendship with Fayrene Fearing Description This visit with Pa was a much deeper visit as she was not distracted on this occasion.  She was more open to me in describing the hurt and anger she is experiencing regarding her family, and the disappointment she feels as they way of supporting her does not meet with her expectations and what she has seen others receive.  We explored her goal for the day "To not throw things" and discussed that this would require controlling her anger and finding positive ways to process her hurt.  Plan:     Will continue to f/u with Pt multiple times a week as long as she is present in [facility].

## 2022-09-06 NOTE — Progress Notes (Signed)
RN called to pts room - pt reporting suicidal ideation, expressed feelings of anxiety and depression, and said she was hearing people.  Safety sitter in room and at bedside.  RN and Recruitment consultant encouraging pt to ambulate in hallway.  Emotional support provided by RN and Recruitment consultant.  PRN administered.  Will continue to monitor.

## 2022-09-06 NOTE — Progress Notes (Signed)
Approximately 2100, Patient became agitated, yelling and throwing equipment out of her room. Patient reported hearing family talking to her and lying to her. Patient was able to be redirected. RN gave po medications and was able to get patient to calm down. Patient resting quietly in bed. Vitals stable.

## 2022-09-06 NOTE — Consult Note (Addendum)
[name] Psychiatry Consult Evaluation  Service Date: September 06, 2022 LOS:  LOS: 15 days    Primary Psychiatric Diagnoses  Schizoaffective disorder, bipolar type  GAD  Assessment  [name] is a 32 y.o. female with a past psychiatric history of borderline personality disorder, schizoaffective disorder bipolar type, and PTSD.  She has had numerous behavioral health admissions and an extensive history of suicide attempts.  Patient presents to [facility] ED for an attempted overdose on her prescription medications.  During this hospitalization, patient has required restraints due to increased agitation, disruptive behavior, and increased risk for harming herself. Patient reports command hallucinations, telling her to harm herself and others. She has had multiple suicide attempts this past year and has multiple risk factors, including severe mood instability, impulsivity, and significant interpersonal difficulties, all of which contribute to an increased risk of suicide. Although adequate safety measures remain in place, it is important to acknowledge the behavioral component to patient's disruptive behavior on the unit, consistent with her diagnosis of BPD. There are inconsistencies in patient's report when seen by different members of the team.   8/1: Discussed challenges in patient dispositional options at Care Team Meeting , patient has exhausted her options at most behavioral health facilities.  We continue to recommend placement at [facility], patient remains on waitlist.  Behavior plan has been successful in preventing further escalation of behavior and restraints, however overnight she became frustrated by auditory hallucinations when her TV stopped working. Oral medications were given and there were no restraints needed to be used. Patient has an extensive history of ER visits and hospitalizations in which she has displayed aggression, psychosis, and danger to self while in proximity of a  Recruitment consultant. Patient also shows high level of lethality, intensity, and frequency of suicidal attempts with multiple inpatient psychiatric admissions with little benefit so we continue to recommend [facility]. Patient is currently on the [facility] waitlist and we are working closely with LSW with bed availability.  Diagnoses:  Active Hospital problems: Principal Problem:   Bipolar I disorder, current or most recent episode depressed, with psychotic features (HCC) Active Problems:   Asthma   GAD (generalized anxiety disorder)   Suicide attempt (HCC)   Overdose   Drug overdose    Plan   ## Psychiatric Medication Recommendations:  -- Continue Cymbalta 60 mg daily  -- Continue Zyprexa 10mg  po daily at bedtime for psychosis. -- Continue Gabapentin 600 mg TID for agitated mood/anxiety -- PRN Zyprexa 10mg  IM for agitation -- PRN Ativan 0.5 mg oral BID PRN for agitation -- PRN Risperdal M-tabs 1 mg BID PRN for agitation  ## Medical Decision Making Capacity:   Capacity was not formally addressed during this encounter  ## Further Work-up:  -- Per primary -- most recent EKG on 08/30/2022 had QtC of 469  ## Disposition:  -- We recommend inpatient psychiatric hospitalization after medical hospitalization. Patient has been involuntarily committed on 09/04/2022.   ## Behavioral / Environmental:  --  To minimize splitting of staff, assign one staff person to communicate all information from the team when feasible. -- Apply the established behavioral rules and protocols consistently throughout the night (see nursing care order for details). Ensure that all staff members are aware of and adhere to the same guidelines to avoid confusion and mixed messages.   ## Safety and Observation Level:  - Based on my clinical evaluation, I estimate the patient to be at elevated risk of self harm in the current setting -  At this time, we recommend continuing a 1: 1 level of observation. This decision is based on my  review of the chart including patient's history and current presentation, interview of the patient, mental status examination, and consideration of suicide risk including evaluating suicidal ideation, plan, intent, suicidal or self-harm behaviors, risk factors, and protective factors. This judgment is based on our ability to directly address suicide risk, implement suicide prevention strategies and develop a safety plan while the patient is in the clinical setting. Please contact our team if there is a concern that risk level has changed.  Suicide risk assessment  Patient has following modifiable risk factors for suicide: active suicidal ideation and recklessness, which we are addressing by continuing adequate safety precautions while she is hospitalized.   Patient has following non-modifiable or demographic risk factors for suicide: history of suicide attempt, history of self harm behavior, and psychiatric hospitalization  Patient has the following protective factors against suicide: Access to outpatient mental health care  Thank you for this consult request. Recommendations have been communicated to the primary team.  We will follow at this time.   Kayleen Memos, Medical Student  I personally was present and performed or re-performed the history and medical decision-making activities of this service and have verified that the service and findings are accurately documented in the student's note.  Kizzie Ide, MD, PGY-2  Psychiatric and Social History   Relevant Aspects of Hospital Course:  Admitted on 08/22/2022 for suicide attempt by overdose on her prescription medication.   Patient Report: 09/06/2022 Patient was seen this morning by myself and Dr. Kizzie Ide. Overnight notes reveal that last night (2100) the patient was yelling and throwing equipment out of her room. She was able to be redirected and was given PO risperidone. [name] was not placed in restraints. This morning, she states  that she has continued to hear voices and that her TV, which typically distracts her from hallucinations, stopped working. [name] refers to both "outside and inside voices" as she hears voices identifiable as her parents from within the room but also hears unidentifiable voices that seem to speak within her head. When her TV turned off she was not able to distract herself from her parents who were trying to control what she wears, what she does, and how she spends her life. She reports that the staff was not talking much and the silence contributed to her lack of distractions from voices. The patient says that upon talking to people she was able to calm down and sleep well. This morning she feels "down" and confirms homcidal ideation towards her parents. She also has suicidal ideation with the specific plan of overdosing. She says that she would not do this, but explains that this is because it is not possible to do while being monitored in the room. [name] expresses that she is frustrated with having to lose palpable belongings and care of her daughter by going to inpatient care, but is able to cope and discussed protective factors such as thinking of her daughter and the [name].  Psychiatric ROS Mood Symptoms Reports increased irritability and homicidal ideations.  She also reports low mood and suicidal ideations. Manic Symptoms Not formally assessed today Anxiety Symptoms Denies any symptoms of anxiety Trauma Symptoms Not formally assessed today.  Has history of PTSD diagnosed in 2016. Psychosis Symptoms Reports AH from parents that are trying to control her decisions. She notes that unidentified voices regularly tell her to hit her head on things.  Past Psychiatric History: Previous Psych Diagnoses: Schizoaffective disorder bipolar type, Anxiety, Autism spectrum disorder, PTSD, Borderline personality disorder Prior inpatient treatment: multiple, over 30 inpatient admissions over the course of her  lifetime, admitted to [facility] from 07/29/22-08/02/2022, has been to [facility], and was discharged from inpatient facility past week.  Current/prior outpatient treatment: [facility] Prior rehab hx: denies multiple Psychotherapy hx: [facility] History of suicide: 20-30 times by overdosing on pills, cutting or drinking 409 disinfectant History of homicide or aggression: denies Psychiatric medication history:  Previously trialed Tegretol, Haldol, Clozaril, Depakote, Invega, Lamictal, Seroquel, asenapine, abilify, vraylar, Lexapro, Zoloft  Neuromodulation history: denies  Social History:  Marital status: Widowed Children: 6 year old daughter Lives: with boyfriend, in two story home Employment: Disability Tobacco: quit smoking; smoked for two years.  Access to weapons: None   Exam Findings   Psychiatric Specialty Exam:  Presentation  General Appearance:  Appropriate for Environment; Casual  Eye Contact: Good  Speech: Clear and Coherent; Normal Rate  Speech Volume: Normal  Handedness: Right  Mood and Affect  Mood: -- ("Down")  Affect: Flat  Thought Process  Thought Processes: Linear; Coherent; Goal Directed  Descriptions of Associations: Intact  Orientation: Full (Time, Place and Person)  Thought Content: Logical  History of Schizophrenia/Schizoaffective disorder: Yes  Duration of Psychotic Symptoms: Greater than six months  Hallucinations:Hallucinations: Auditory Description of Command Hallucinations: Telling her to hit her head Description of Auditory Hallucinations: From parents disapproving of her life decisions and trying to control her.  Ideas of Reference: None  Suicidal Thoughts:Suicidal Thoughts: Yes, Active SI Passive Intent and/or Plan: Without Intent; Has considered overdosing but knows this is not feasible while in hospital care.  Homicidal Thoughts:Homicidal Thoughts: Yes, towards her parents  Sensorium  Memory: Immediate  Fair; Recent Fair; Remote Fair  Judgment: Poor  Insight: Shallow  Executive Functions  Concentration: Fair  Attention Span: Fair  Recall: Fair  Fund of Knowledge: Fair  Language: Fair  Psychomotor Activity  Psychomotor Activity:Psychomotor Activity: Normal  Assets  Assets: Manufacturing systems engineer; Desire for Improvement  Sleep  Sleep:Sleep: Fair  Physical Exam: Vital signs:  Temp:  [98.3 F (36.8 C)-98.6 F (37 C)] 98.3 F (36.8 C) (08/01 0931) Pulse Rate:  [103-116] 103 (08/01 0931) Resp:  [17-20] 18 (08/01 0931) BP: (108-126)/(56-70) 119/66 (08/01 0931) SpO2:  [98 %-100 %] 99 % (08/01 0931) Physical Exam Vitals and nursing note reviewed. Exam conducted with a chaperone present.  Constitutional:      General: She is not in acute distress.    Appearance: She is not ill-appearing.  Pulmonary:     Effort: Pulmonary effort is normal. No respiratory distress.  Skin:    General: Skin is warm and dry.  Neurological:     General: No focal deficit present.     Mental Status: She is alert.  Psychiatric:        Attention and Perception: Attention normal. She perceives auditory hallucinations. She does not perceive visual hallucinations.        Mood and Affect: Affect is flat.        Speech: Speech normal.        Behavior: Behavior is not aggressive or combative. Behavior is cooperative.        Thought Content: Thought content includes homicidal and suicidal ideation. Thought content includes homicidal and suicidal plan.        Judgment: Judgment is impulsive.     Blood pressure 119/66, pulse (!) 103, temperature 98.3 F (36.8 C), temperature  source Oral, resp. rate 18, height 5\' 5"  (1.651 m), weight 98.2 kg, last menstrual period 08/08/2022, SpO2 99%. Body mass index is 36.03 kg/m.   Other History   These have been pulled in through the EMR, reviewed, and updated if appropriate.   Family History:  The patient's family history includes Asthma in her  father; Mental illness in her father; PDD in her brother; Seizures in her brother.  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    Chronic constipation    Depression    Drug-seeking behavior    Essential tremor    Headache    Ineffective individual coping 05/16/2022   Insomnia 01/12/2022   Intentional drug overdose (HCC) 06/05/2022   Neuromuscular disorder (HCC)    Normocytic anemia 06/05/2022   Overdose 07/22/2017   Overdose of acetaminophen 07/2017   and other meds   Overdose, intentional self-harm, initial encounter (HCC) 07/20/2021   Paranoia (HCC) 04/22/2021   Personality disorder (HCC)    Purposeful non-suicidal drug ingestion (HCC) 06/27/2021   Schizo-affective psychosis (HCC)    Schizoaffective disorder (HCC) 07/29/2022   Schizoaffective disorder, bipolar type (HCC)    Seizures (HCC)    Last seizure December 2017   Skin erythema 04/27/2022   Sleep apnea    Suicidal behavior 07/25/2021   Suicidal ideation    Suicide (HCC) 07/01/2021   Suicide attempt (HCC) 07/04/2021    Surgical History: Past Surgical History:  Procedure Laterality Date   MOUTH SURGERY  2009 or 2010    Medications:   Current Facility-Administered Medications:    0.9 %  sodium chloride infusion, 250 mL, Intravenous, Continuous, Gonfa, Taye T, MD, Stopped at 08/24/22 1347   albuterol (PROVENTIL) (2.5 MG/3ML) 0.083% nebulizer solution 3 mL, 3 mL, Inhalation, Q6H PRN, Gonfa, Taye T, MD   benztropine (COGENTIN) tablet 1 mg, 1 mg, Oral, QHS, Gonfa, Taye T, MD, 1 mg at 09/05/22 2138   cyclobenzaprine (FLEXERIL) tablet 10 mg, 10 mg, Oral, QHS, Gonfa, Taye T, MD, 10 mg at  09/05/22 2137   diclofenac Sodium (VOLTAREN) 1 % topical gel 2 g, 2 g, Topical, QID, Gonfa, Taye T, MD, 2 g at 09/06/22 0940   DULoxetine (CYMBALTA) DR capsule 60 mg, 60 mg, Oral, Daily, Akintayo, Mojeed, MD, 60 mg at 09/06/22 0933   ferrous sulfate tablet 325 mg, 325 mg, Oral, BID WC, Gonfa, Taye T, MD, 325 mg at 09/06/22 0933   gabapentin (NEURONTIN) capsule 600 mg, 600 mg, Oral, TID, Alanda Slim, Taye T, MD, 600 mg at 09/06/22 0933   hydrOXYzine (ATARAX) tablet 25 mg, 25 mg, Oral, TID PRN, Candelaria Stagers T, MD, 25 mg at 09/05/22 1838   ibuprofen (ADVIL) tablet 600 mg, 600 mg, Oral, TID PRN, Candelaria Stagers T, MD, 600 mg at 09/04/22 1939   LORazepam (ATIVAN) tablet 0.5 mg, 0.5 mg, Oral, BID PRN, Alanda Slim, Taye T, MD, 0.5 mg at 09/06/22 0933   LORazepam (ATIVAN) tablet 0.5 mg, 0.5 mg, Oral, Once PRN, Cinderella, Margaret A   mometasone-formoterol (DULERA) 200-5 MCG/ACT inhaler 2 puff, 2 puff, Inhalation, BID, Candelaria Stagers T, MD, 2 puff at 09/06/22 0856   mupirocin  cream (BACTROBAN) 2 %, , Topical, Daily, Marguerita Merles [place], [place], Given at 09/06/22 0940   OLANZapine (ZYPREXA) injection 10 mg, 10 mg, Intramuscular, Once PRN, Akintayo, Mojeed, MD   OLANZapine (ZYPREXA) tablet 10 mg, 10 mg, Oral, QHS, Gonfa, Taye T, MD, 10 mg at 09/05/22 2137   ondansetron (ZOFRAN-ODT) disintegrating tablet 4 mg, 4 mg, Oral, Q8H PRN, Sheikh, Omair Latif, DO, 4 mg at 09/01/22 2025   Oral care mouth rinse, 15 mL, Mouth Rinse, PRN, Gonfa, Taye T, MD   pantoprazole (PROTONIX) EC tablet 40 mg, 40 mg, Oral, QAC breakfast, Gonfa, Taye T, MD, 40 mg at 09/06/22 0934   polyethylene glycol (MIRALAX / GLYCOLAX) packet 17 g, 17 g, Oral, BID PRN, Alanda Slim, Taye T, MD, 17 g at 09/06/22 0940   prazosin (MINIPRESS) capsule 1 mg, 1 mg, Oral, QHS, Gonfa, Taye T, MD, 1 mg at 09/05/22 2138   risperiDONE (RISPERDAL M-TABS) disintegrating tablet 1 mg, 1 mg, Oral, BID PRN, Maryagnes Amos, FNP, 1 mg at 09/05/22 2138   senna-docusate (Senokot-S) tablet 1  tablet, 1 tablet, Oral, BID PRN, Candelaria Stagers T, MD, 1 tablet at 09/06/22 0940   sodium bicarbonate tablet 650 mg, 650 mg, Oral, TID, Alanda Slim, Taye T, MD, 650 mg at 09/06/22 0933   traZODone (DESYREL) tablet 50 mg, 50 mg, Oral, QHS PRN, Candelaria Stagers T, MD, 50 mg at 09/05/22 2137  Allergies: Allergies  Allergen Reactions   Bee Venom Anaphylaxis   Coconut Flavor Anaphylaxis and Rash   Fish Allergy Anaphylaxis   Geodon [Ziprasidone Hcl] Other (See Comments)    Pt states that this medication causes paralysis of the mouth.     Haloperidol And Related Other (See Comments)    Pt states that this medication causes paralysis of the mouth, jaw locks up   Lithobid [Lithium] Other (See Comments)    Seizure-like activity    Roxicodone [Oxycodone] Other (See Comments)    Hallucinations    Seroquel [Quetiapine] Other (See Comments)    Severe drowsiness - Pt currently taking 12.5 mg BID, she is ok with this dose   Shellfish Allergy Anaphylaxis   Phenergan [Promethazine Hcl] Other (See Comments)    Chest pain     Prilosec [Omeprazole] Nausea And Vomiting and Other (See Comments)    Pt can take protonix with no problems    Sulfa Antibiotics Other (See Comments)    Chest pain    Tegretol [Carbamazepine] Nausea And Vomiting   Prozac [Fluoxetine] Other (See Comments)    Increased Depression and Suicidal thoughts   Tape Other (See Comments)    Skin tears, can only tolerate paper tape.   Tylenol [Acetaminophen] Rash and Other (See Comments)    Rash on face

## 2022-09-07 ENCOUNTER — Encounter (HOSPITAL_COMMUNITY): Payer: Self-pay | Admitting: Pulmonary Disease

## 2022-09-07 DIAGNOSIS — F315 Bipolar disorder, current episode depressed, severe, with psychotic features: Secondary | ICD-10-CM | POA: Diagnosis not present

## 2022-09-07 MED ORDER — OLANZAPINE 10 MG PO TABS
20.0000 mg | ORAL_TABLET | Freq: Every day | ORAL | Status: DC
  Administered 2022-09-07 – 2022-09-17 (×11): 20 mg via ORAL
  Filled 2022-09-07 (×11): qty 2

## 2022-09-07 MED ORDER — OLANZAPINE 5 MG PO TBDP
5.0000 mg | ORAL_TABLET | Freq: Two times a day (BID) | ORAL | Status: DC | PRN
  Administered 2022-09-12 – 2022-09-20 (×2): 5 mg via ORAL
  Filled 2022-09-07 (×4): qty 1

## 2022-09-07 NOTE — Care Management Important Message (Signed)
Important Message  Patient Details  Name: Enya Bureau MRN: [id number] Date of Birth: [birth year]   Medicare Important Message Given:  Yes     Sherilyn Banker 09/07/2022, 2:11 PM

## 2022-09-07 NOTE — Consult Note (Addendum)
[name] Consult Evaluation  Service Date: September 07, 2022 LOS:  LOS: 16 days    Primary Psychiatric Diagnoses  Schizoaffective disorder, bipolar type  GAD  Assessment  [name] is a 32 y.o. female with a past psychiatric history of borderline personality disorder, schizoaffective disorder bipolar type, and PTSD.  She has had numerous behavioral health admissions and an extensive history of suicide attempts.  Patient presents to [facility] ED for an attempted overdose on her prescription medications.  During this hospitalization, patient has required restraints due to increased agitation, disruptive behavior, and increased risk for harming herself. Patient reports command hallucinations, telling her to harm herself and others. She has had multiple suicide attempts this past year and has multiple risk factors, including severe mood instability, impulsivity, and significant interpersonal difficulties, all of which contribute to an increased risk of suicide. Although adequate safety measures remain in place, it is important to acknowledge the behavioral component to patient's disruptive behavior on the unit, consistent with her diagnosis of BPD. There are inconsistencies in patient's report when seen by different members of the team.   8/2: Patient's symptoms are similar to yesterday regarding internal thoughts and hearing external voices of her parents. We extensively discussed patient's behaviors and consequences and more beneficial outlets when patient feels bored. Patient had excalation of behaviors overnight. At this time, we are simplifying patient's medication regimen to avoid polypharmacy especially in the setting of her recent overdose. We also discussed medication management when she does return home including closer monitoring, less access to her medications through a locked cabinet, and her boyfriend being more in charge of her medications. Patient has an extensive  history of ER visits and hospitalizations in which she has displayed aggression, psychosis, and danger to self while in proximity of a Recruitment consultant. Patient also shows high level of lethality, intensity, and frequency of suicidal attempts with multiple inpatient psychiatric admissions with little benefit so we continue to recommend [facility]. Patient is currently on the [facility] waitlist and we are working closely with LSW with bed availability.    Diagnoses:  Active Hospital problems: Principal Problem:   Bipolar I disorder, current or most recent episode depressed, with psychotic features (HCC) Active Problems:   Asthma   GAD (generalized anxiety disorder)   Suicide attempt (HCC)   Overdose   Drug overdose    Plan   ## Psychiatric Medication Recommendations:  -- Increase Zyprexa to 20mg  po nightly for psychosis -- Start PRN Zyprexa zydis 5 mg BID for agitation -- D/c PRN Risperdal-M tabs for agitation -- D/c Cogentin as she is not showing signs of EPS symptoms at this time -- D/c Trazodone due to concern of oversedation  -- Continue Cymbalta 60 mg daily  -- Continue Gabapentin 600 mg TID for agitated mood/anxiety -- PRN Zyprexa 10mg  IM for agitation -- PRN Ativan 0.5 mg oral BID PRN for agitation  ## Medical Decision Making Capacity:   Capacity was not formally addressed during this encounter  ## Further Work-up:  -- Per primary -- most recent EKG on 08/30/2022 had QtC of 469  ## Disposition:  -- We recommend inpatient psychiatric hospitalization after medical hospitalization. Patient has been involuntarily committed on 09/04/2022.   ## Behavioral / Environmental:  --  To minimize splitting of staff, assign one staff person to communicate all information from the team when feasible. -- Apply the established behavioral rules and protocols consistently throughout the night (see nursing care order for details). Ensure that all  staff members are aware of and adhere to the same guidelines to  avoid confusion and mixed messages.   ## Safety and Observation Level:  - Based on my clinical evaluation, I estimate the patient to be at elevated risk of self harm in the current setting - At this time, we recommend continuing a 1: 1 level of observation. This decision is based on my review of the chart including patient's history and current presentation, interview of the patient, mental status examination, and consideration of suicide risk including evaluating suicidal ideation, plan, intent, suicidal or self-harm behaviors, risk factors, and protective factors. This judgment is based on our ability to directly address suicide risk, implement suicide prevention strategies and develop a safety plan while the patient is in the clinical setting. Please contact our team if there is a concern that risk level has changed.  Suicide risk assessment  Patient has following modifiable risk factors for suicide: active suicidal ideation and recklessness, which we are addressing by continuing adequate safety precautions while she is hospitalized.   Patient has following non-modifiable or demographic risk factors for suicide: history of suicide attempt, history of self harm behavior, and psychiatric hospitalization  Patient has the following protective factors against suicide: Access to outpatient mental health care  Thank you for this consult request. Recommendations have been communicated to the primary team.  We will follow at this time.   Kizzie Ide, MD PGY-2 Psychiatry   Psychiatric and Social History   Relevant Aspects of Hospital Course:  Admitted on 08/22/2022 for suicide attempt by overdose on her prescription medication.   Patient Report: 09/07/2022 Per nursing, Patient had one behavioral outburst where she throw items and banged on the glass on the door. Patient was redirected and items removed. Security was called to patient room. No restricts applied to patient. Nurse was able to talk to  patient through behavioral outburst PRN medication given. Patient was given bible and television turned on. Patient was given beverages and ice cream. Patient did remain calm the rest of shift. Patient resting in bed with eyes closed.   This morning, patient felt "okay". She recalls earlier today, she felt upset when she was hearing an internal voice telling her to throw objects. She acknowledges how this behavior is not helpful and discusses other activities she can focus on when she feels upset including reading her Bible and listening to music. She feels that her medications have been helpful for her mood, specifically her Cymbalta for depression and Zyprexa for the auditory hallucinations. She specifies that when she hear voices, its "all in my head and Zyprexa shuts it off". She denies SI and HI and states that she does not want to keep returning to the hospital. She also notes that she needs help when big stressors occur in her life. She notes that the reason she came in this admission was because she recently lost her job. We discussed with her how in the future, contacting her CST team when a big stressor occurs so she would be able to have support during the event may be helpful. She also feels that closer monitoring and less access to her medication management would be helpful with overdosing in the future. She details how at this time, she has been discussing with her ex-boyfriend, whom she lives with currently, ways for her to have closer monitoring like locking the medication cabinet. She notes that at home, She takes Vistaril at 4 PM, takes all her scheduled night time medication around  9 PM and she sleeps around 11 PM.  Psychiatric ROS Mood Symptoms Reports increased irritability and homicidal ideations.  She also reports low mood and suicidal ideations.  Anxiety Symptoms Denies any symptoms of anxiety  Trauma Symptoms Not formally assessed today.  Has history of PTSD diagnosed in  2016.  Psychosis Symptoms Reports AH from parents that are trying to control her decisions. She notes that unidentified voices regularly tell her to hit her head on things.  Past Psychiatric History: Previous Psych Diagnoses: Schizoaffective disorder bipolar type, Anxiety, Autism spectrum disorder, PTSD, Borderline personality disorder Prior inpatient treatment: multiple, 15 ED visits and 4 inpatient admissions since 06/2022, admitted to [facility] from 07/29/22-08/02/2022, has been to [facility] for eight months in 2016, and was discharged from inpatient facility past week Current/prior outpatient treatment: [facility], CST team twice a week Prior rehab hx: denies multiple Psychotherapy hx: [facility] History of suicide: 20-30 times by overdosing on pills, cutting or drinking 409 disinfectant History of homicide or aggression: denies Psychiatric medication history:  Previously trialed Tegretol, Haldol, Clozaril, Depakote, Invega, Lamictal, Seroquel, asenapine, abilify, vraylar (worsened suicidal thoughts), Lexapro, Zoloft  Neuromodulation history: denies  Social History:  Marital status: Widowed Children: 6 year old daughter Lives: with ex-boyfriend [name], in two story home Employment: Disability, 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  Access to weapons: None   Exam Findings   Psychiatric Specialty Exam:  Presentation  General Appearance:  Appropriate for Environment; Casual  Eye Contact: Good  Speech: Clear and Coherent; Normal Rate  Speech Volume: Normal  Handedness: Right  Mood and Affect  Mood: "Okay"  Affect: Restricted  Thought Process  Thought Processes: Linear; Coherent; Goal Directed  Descriptions of Associations: Intact  Orientation: Full (Time, Place and Person)  Thought Content: Logical  History of Schizophrenia/Schizoaffective disorder: Yes  Duration of Psychotic Symptoms: Greater  than six months  Hallucinations:Hallucinations: Auditory Description of Command Hallucinations: Telling her to hit her head Description of Auditory Hallucinations: From parents disapproving of her life decisions and trying to control her, also an internal voice that she is unable to recognize  Ideas of Reference: None  Suicidal Thoughts:Suicidal Thoughts: Denies  Homicidal Thoughts:Homicidal Thoughts: Denies  Sensorium  Memory: Immediate Fair; Recent Fair; Remote Fair  Judgment: Poor  Insight: Shallow  Executive Functions  Concentration: Fair  Attention Span: Fair  Recall: [employer] of Knowledge: Fair  Language: Fair  Psychomotor Activity  Psychomotor Activity:None  Assets  Assets: Manufacturing systems engineer; Desire for Improvement  Sleep  Sleep:7 hours  Physical Exam: Vital signs:  Temp:  [97.8 F (36.6 C)-98.5 F (36.9 C)] 98.5 F (36.9 C) (08/01 1956) Pulse Rate:  [105-108] 108 (08/01 1956) Resp:  [18] 18 (08/01 1956) BP: (114-129)/(75-77) 114/75 (08/01 1956) SpO2:  [97 %-100 %] 98 % (08/02 0820) Physical Exam Vitals and nursing note reviewed.  Constitutional:      General: She is not in acute distress.    Appearance: She is not ill-appearing.  Pulmonary:     Effort: Pulmonary effort is normal. No respiratory distress.  Skin:    General: Skin is warm and dry.  Neurological:     General: No focal deficit present.     Mental Status: She is alert.  Psychiatric:        Attention and Perception: Attention normal. She perceives auditory hallucinations. She does not perceive visual hallucinations.        Mood and Affect: Affect is  flat.        Speech: Speech normal.        Behavior: Behavior is not aggressive or combative. Behavior is cooperative.        Thought Content: Thought content does not include homicidal or suicidal ideation.        Judgment: Judgment is impulsive.     Blood pressure 114/75, pulse (!) 108, temperature 98.5 F (36.9 C),  temperature source Oral, resp. rate 18, height 5\' 5"  (1.651 m), weight 98.2 kg, last menstrual period 08/08/2022, SpO2 98%. Body mass index is 36.03 kg/m.   Other History   These have been pulled in through the EMR, reviewed, and updated if appropriate.   Family History:  The patient's family history includes Asthma in her father; Mental illness in her father; PDD in her brother; Seizures in her brother.  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    Chronic constipation    Depression    Drug-seeking behavior    Essential tremor    Headache    Ineffective individual coping 05/16/2022   Insomnia 01/12/2022   Intentional drug overdose (HCC) 06/05/2022   Neuromuscular disorder (HCC)    Normocytic anemia 06/05/2022   Overdose 07/22/2017   Overdose of acetaminophen 07/2017   and other meds   Overdose, intentional self-harm, initial encounter (HCC) 07/20/2021   Paranoia (HCC) 04/22/2021   Personality disorder (HCC)    Purposeful non-suicidal drug ingestion (HCC) 06/27/2021   Schizo-affective psychosis (HCC)    Schizoaffective disorder (HCC) 07/29/2022   Schizoaffective disorder, bipolar type (HCC)    Seizures (HCC)    Last seizure December 2017   Skin erythema 04/27/2022   Sleep apnea    Suicidal behavior 07/25/2021   Suicidal ideation    Suicide (HCC) 07/01/2021   Suicide attempt (HCC) 07/04/2021    Surgical History: Past Surgical History:  Procedure Laterality Date   MOUTH SURGERY  2009 or 2010    Medications:   Current Facility-Administered Medications:    0.9 %  sodium chloride infusion, 250 mL, Intravenous, Continuous, Gonfa, Taye T,  MD, Stopped at 08/24/22 1347   albuterol (PROVENTIL) (2.5 MG/3ML) 0.083% nebulizer solution 3 mL, 3 mL, Inhalation, Q6H PRN, Alanda Slim, Taye T, MD   cyclobenzaprine (FLEXERIL) tablet 10 mg, 10 mg, Oral, QHS, Gonfa, Taye T, MD, 10 mg at 09/06/22 2137   diclofenac Sodium (VOLTAREN) 1 % topical gel 2 g, 2 g, Topical, QID, Gonfa, Taye T, MD, 2 g at 09/07/22 1432   DULoxetine (CYMBALTA) DR capsule 60 mg, 60 mg, Oral, Daily, Akintayo, Mojeed, MD, 60 mg at 09/07/22 0835   ferrous sulfate tablet 325 mg, 325 mg, Oral, BID WC, Gonfa, Taye T, MD, 325 mg at 09/07/22 0835   gabapentin (NEURONTIN) capsule 600 mg, 600 mg, Oral, TID, Alanda Slim, Taye T, MD, 600 mg at 09/07/22 0835   hydrOXYzine (ATARAX) tablet 25 mg, 25 mg, Oral, TID PRN, Alanda Slim, Taye T, MD, 25 mg at 09/06/22 1304   ibuprofen (ADVIL) tablet 600 mg, 600 mg, Oral, TID PRN, Candelaria Stagers T, MD, 600 mg at 09/04/22 1939   LORazepam (ATIVAN) tablet 0.5 mg, 0.5  mg, Oral, BID PRN, Alanda Slim, Taye T, MD, 0.5 mg at 09/06/22 2143   LORazepam (ATIVAN) tablet 0.5 mg, 0.5 mg, Oral, Once PRN, Cinderella, Margaret A   mometasone-formoterol (DULERA) 200-5 MCG/ACT inhaler 2 puff, 2 puff, Inhalation, BID, Alanda Slim, Taye T, MD, 2 puff at 09/07/22 0820   mupirocin cream (BACTROBAN) 2 %, , Topical, Daily, Marguerita Merles [place], [place], Given at 09/06/22 0940   OLANZapine (ZYPREXA) injection 10 mg, 10 mg, Intramuscular, Once PRN, Akintayo, Mojeed, MD   OLANZapine (ZYPREXA) tablet 20 mg, 20 mg, Oral, Daily, Viviano Simas, Orlean Bradford, MD   OLANZapine zydis (ZYPREXA) disintegrating tablet 5 mg, 5 mg, Oral, BID PRN, Mariel Craft, MD   ondansetron (ZOFRAN-ODT) disintegrating tablet 4 mg, 4 mg, Oral, Q8H PRN, Sheikh, Omair Latif, DO, 4 mg at 09/01/22 2025   Oral care mouth rinse, 15 mL, Mouth Rinse, PRN, Osvaldo Shipper, MD   pantoprazole (PROTONIX) EC tablet 40 mg, 40 mg, Oral, QAC breakfast, Gonfa, Taye T, MD, 40 mg at 09/07/22 0835   polyethylene glycol (MIRALAX / GLYCOLAX) packet 17 g, 17 g, Oral,  BID PRN, Alanda Slim, Taye T, MD, 17 g at 09/07/22 0835   prazosin (MINIPRESS) capsule 1 mg, 1 mg, Oral, QHS, Gonfa, Taye T, MD, 1 mg at 09/06/22 2136   senna-docusate (Senokot-S) tablet 1 tablet, 1 tablet, Oral, BID PRN, Candelaria Stagers T, MD, 1 tablet at 09/07/22 0835   sodium bicarbonate tablet 650 mg, 650 mg, Oral, TID, Candelaria Stagers T, MD, 650 mg at 09/07/22 0835  Allergies: Allergies  Allergen Reactions   Bee Venom Anaphylaxis   Coconut Flavor Anaphylaxis and Rash   Fish Allergy Anaphylaxis   Geodon [Ziprasidone Hcl] Other (See Comments)    Pt states that this medication causes paralysis of the mouth.     Haloperidol And Related Other (See Comments)    Pt states that this medication causes paralysis of the mouth, jaw locks up   Lithobid [Lithium] Other (See Comments)    Seizure-like activity    Roxicodone [Oxycodone] Other (See Comments)    Hallucinations    Seroquel [Quetiapine] Other (See Comments)    Severe drowsiness - Pt currently taking 12.5 mg BID, she is ok with this dose   Shellfish Allergy Anaphylaxis   Phenergan [Promethazine Hcl] Other (See Comments)    Chest pain     Prilosec [Omeprazole] Nausea And Vomiting and Other (See Comments)    Pt can take protonix with no problems    Sulfa Antibiotics Other (See Comments)    Chest pain    Tegretol [Carbamazepine] Nausea And Vomiting   Prozac [Fluoxetine] Other (See Comments)    Increased Depression and Suicidal thoughts   Tape Other (See Comments)    Skin tears, can only tolerate paper tape.   Tylenol [Acetaminophen] Rash and Other (See Comments)    Rash on face

## 2022-09-07 NOTE — Plan of Care (Signed)
  Problem: Activity: Goal: Risk for activity intolerance will decrease Outcome: Progressing   

## 2022-09-07 NOTE — Plan of Care (Signed)
I attempted to call for collateral information with patient's friend Karn Cassis but there was no answer.   Kizzie Ide, MD PGY-2 Psychiatry

## 2022-09-07 NOTE — Progress Notes (Signed)
Patient had one behavioral outburst where she throw items and banged on the glass on the door. Patient was redirected and items removed. Security was called to patient room. No restricts applied to patient. Nurse was able to talk to patient through behavioral outburst PRN medication given. Patient was given bible and television turned on. Patient was given beverages and ice cream. Patient did remain calm the rest of shift. Patient resting in bed with eyes closed.

## 2022-09-07 NOTE — Progress Notes (Signed)
[facility] PROGRESS NOTE   [name] XBJ:[id number] DOB: 10/01/1990 DOA: 08/22/2022  PCP: [name]  Brief History/Interval Summary: 32 y.o. F with PMH of adjustment disorder with mixed anxiety and depressed mood, autism, bipolar 1 disorder, fragile x syndrome, seizures not on meds, schizoaffective disorder and multiple suicide attempts in the last year who presented to the ED after ingesting numerous medications in a suicide attempt.  She reported taking #30 metoprolol 25mg  tablets, #60 Hydroxyzine 25mg , #7 Lexapro 20mg , #21 Gabapentin 600mg , #7 Flexeril 10mg , #7 protonix 40mg  and #7 Prazosin 1mg .   She was slightly bradycardic and hypotensive on EMS arrival which improved with fluids.  Poison control was contacted and as she was within 2hrs of ingestion was given activated charcoal.  Placed under IVC in ED.  Poison control also recommended ICU admission for close Qtc and electrolyte monitoring and possible pressors.  At the time of admission, pt was somnolent but arousable to voice and following commands. She was intubated and started on vasopressor and glucagon.  Also started on ceftriaxone for possible pneumonia.  Patient was extubated on 7/18 and transferred to the floor on 7/19. She was transferred to [facility] service on 08/25/22.  Psychiatry continues to follow.  They recommend inpatient psychiatric hospitalization.   Consultants: Psychiatry     Subjective/Interval History: Overnight events noted.  Patient has been agitated periodically.  Refused labs this morning.  She was told that it has been several days since we had blood work done but she continues to refuse.    Assessment/Plan:  Suicidal Attempt with intentional overdose:  -Overdose with multiple medications including #30 metoprolol 25mg  tablets, #60 Hydroxyzine 25mg , #7 Lexapro 20mg , #21 Gabapentin 600mg , #7 Flexeril 10mg , #7 protonix 40mg  and #7 Prazosin 1mg .  Poison control consulted. Received  charcoal. Denies ETOH or illicit drug use.  Extended UDS negative. -Required vasopressor, glucagon, intubation and mechanical ventilation 7/17-7/18.   Currently she is involuntarily committed. Psychiatry is following closely. She continues to mention suicidal ideation.   Anxiety and Depression, Schizoaffective Disorder, Adjustment Disorder, bipolar disorder, fibromyalgia and auditory hallucination:  Patient noted to be on Cogentin, Cymbalta, Neurontin, Zyprexa, prazosin. Psychiatry follows closely. She was on Lexapro prior to admission which has been changed over to Cymbalta. -Patient started hearing voices in her head that were "too much for her to handle" and then she threatened "that she was in a kill and strangle one of the staff members" -Intermittently has to be placed in 4-point restraints and given IM Zyprexa.  She still has a one-to-one sitter at bedside and will continue -Cymbalta has been increased from 30 mg to 60 mg daily and will be continuing olanzapine 10 mg p.o. daily nightly for psychosis and continuing gabapentin 600 mg 3 times daily for agitated mood/anxiety Continues to have periodic episodes of agitation.  Await placement to inpatient psychiatric facility.   CAP Completed course of antibiotics.  Respiratory status is stable.   Essential hypertension  Normotensive. Was apparently on metoprolol prior to admission which is currently on hold. Labs were ordered for today but patient refuses.   Sinus Tachycardia -Resolved.   Metabolic Acidosis -Improved.  Noted to be on sodium bicarbonate.   Hypomagnesemia Monitor periodically.   Microcytic Anemia Continue ferrous sulfate.  Blood work was ordered for this morning but patient refuses.  Hemoglobin was stable previously.     History of Seizure Disorder -Stable.  Not on meds.   History of Asthma -Stable.   GERD/GI Prophylaxis -Continue PPI with  Pantoprazole and Famotidine 20 mg po BID   Obesity -Complicates  overall prognosis and care -Estimated body mass index is 36.03 kg/m as calculated from the following:   Height as of this encounter: 5\' 5"  (1.651 m).   Weight as of this encounter: 98.2 kg.  -Weight Loss and Dietary Counseling given   DVT prophylaxis: SCDs  Code Status: Full Code Family Communication: No family present at bedside  Disposition Plan: Awaiting inpatient psychiatric hospitalization.    Medications: Scheduled:  benztropine  1 mg Oral QHS   cyclobenzaprine  10 mg Oral QHS   diclofenac Sodium  2 g Topical QID   DULoxetine  60 mg Oral Daily   ferrous sulfate  325 mg Oral BID WC   gabapentin  600 mg Oral TID   mometasone-formoterol  2 puff Inhalation BID   mupirocin cream   Topical Daily   OLANZapine  10 mg Oral QHS   pantoprazole  40 mg Oral QAC breakfast   prazosin  1 mg Oral QHS   sodium bicarbonate  650 mg Oral TID   Continuous:  sodium chloride Stopped (08/24/22 1347)   ZOX:WRUEAVWUJ, hydrOXYzine, ibuprofen, LORazepam, LORazepam, OLANZapine, ondansetron, mouth rinse, polyethylene glycol, risperiDONE, senna-docusate, traZODone  Antibiotics: Anti-infectives (From admission, onward)    Start     Dose/Rate Route Frequency Ordered Stop   08/26/22 1415  amoxicillin-clavulanate (AUGMENTIN) 875-125 MG per tablet 1 tablet        1 tablet Oral Every 12 hours 08/26/22 1316 08/27/22 2205   08/23/22 1200  cefTRIAXone (ROCEPHIN) 2 g in sodium chloride 0.9 % 100 mL IVPB  Status:  Discontinued        2 g 200 mL/hr over 30 Minutes Intravenous Every 24 hours 08/23/22 0931 08/26/22 1316       Objective:  Vital Signs  Vitals:   09/06/22 0931 09/06/22 1535 09/06/22 1956 09/07/22 0820  BP: 119/66 129/77 114/75   Pulse: (!) 103 (!) 105 (!) 108   Resp: 18 18 18    Temp: 98.3 F (36.8 C) 97.8 F (36.6 C) 98.5 F (36.9 C)   TempSrc: Oral Oral Oral   SpO2: 99% 100% 97% 98%  Weight:      Height:        Intake/Output Summary (Last 24 hours) at 09/07/2022 0947 Last data  filed at 09/06/2022 1630 Gross per 24 hour  Intake 300 ml  Output --  Net 300 ml   Filed Weights   09/01/22 0300 09/02/22 0500 09/03/22 0402  Weight: 98.8 kg 99 kg 98.2 kg   Patient awake and alert.  In no distress. Lungs are clear to auscultation bilaterally. S1-S2 is normal regular. Abdomen is soft.  Lab Results:  Data Reviewed: I have personally reviewed following labs and reports of the imaging studies  CBC: No results for input(s): "WBC", "NEUTROABS", "HGB", "HCT", "MCV", "PLT" in the last 168 hours.  Basic Metabolic Panel: Recent Labs  Lab 09/05/22 0816  CREATININE 0.78    GFR: Estimated Creatinine Clearance: 116.1 mL/min (by C-G formula based on SCr of 0.78 mg/dL).  Liver Function Tests: No results for input(s): "AST", "ALT", "ALKPHOS", "BILITOT", "PROT", "ALBUMIN" in the last 168 hours.    Radiology Studies: No results found.     LOS: 16 days   [name] [employer] on [email or web address]  09/07/2022, 9:47 AM

## 2022-09-07 NOTE — TOC Progression Note (Signed)
Transition of Care [facility]) - Progression Note    Patient Details  Name: [name] MRN: [id number] Date of Birth: 05-Oct-1990  Transition of Care [facility]) CM/SW Contact  Lorri Frederick, LCSW Phone Number: 09/07/2022, 1:36 PM  Clinical Narrative:  CSW faxed two more notes to [facility] admissions.  CSW called [facility] and admissions and they report that pt is now on the priority wait list.            Expected Discharge Plan and Services                                               Social Determinants of Health (SDOH) Interventions SDOH Screenings   Food Insecurity: No Food Insecurity (07/29/2022)  Housing: Low Risk  (07/29/2022)  Transportation Needs: No Transportation Needs (07/29/2022)  Recent Concern: Transportation Needs - Unmet Transportation Needs (05/06/2022)  Utilities: Not At Risk (07/29/2022)  Alcohol Screen: Low Risk  (07/29/2022)  Depression (PHQ2-9): Medium Risk (03/10/2022)  Tobacco Use: Medium Risk (09/07/2022)    Readmission Risk Interventions    06/06/2022    3:38 PM  Readmission Risk Prevention Plan  Transportation Screening Complete  Medication Review (RN Care Manager) Complete  PCP or Specialist appointment within 3-5 days of discharge Complete  HRI or Home Care Consult Complete  SW Recovery Care/Counseling Consult Complete  Palliative Care Screening Not Applicable  Skilled Nursing Facility Not Applicable

## 2022-09-08 DIAGNOSIS — F315 Bipolar disorder, current episode depressed, severe, with psychotic features: Secondary | ICD-10-CM | POA: Diagnosis not present

## 2022-09-08 NOTE — Plan of Care (Signed)
  Problem: Activity: Goal: Risk for activity intolerance will decrease Outcome: Progressing   Problem: Coping: Goal: Level of anxiety will decrease Outcome: Progressing   Problem: Safety: Goal: Ability to remain free from injury will improve Outcome: Progressing   

## 2022-09-08 NOTE — Consult Note (Signed)
[name] Consult Evaluation  Service Date: September 08, 2022 LOS:  LOS: 17 days    Primary Psychiatric Diagnoses  Schizoaffective disorder, bipolar type  GAD  Assessment  Izzabelle Bouley is a 32 y.o. female with a past psychiatric history of borderline personality disorder, schizoaffective disorder bipolar type, and PTSD.  She has had numerous behavioral health admissions and an extensive history of suicide attempts.  Patient presents to [facility] ED for an attempted overdose on her prescription medications.  During this hospitalization, patient has required restraints due to increased agitation, disruptive behavior, and increased risk for harming herself. Patient reports command hallucinations, telling her to harm herself and others. She has had multiple suicide attempts this past year and has multiple risk factors, including severe mood instability, impulsivity, and significant interpersonal difficulties, all of which contribute to an increased risk of suicide. Although adequate safety measures remain in place, it is important to acknowledge the behavioral component to patient's disruptive behavior on the unit, consistent with her diagnosis of BPD. There are inconsistencies in patient's report when seen by different members of the team.   8/2: Patient's symptoms are similar to yesterday regarding internal thoughts and hearing external voices of her parents. We extensively discussed patient's behaviors and consequences and more beneficial outlets when patient feels bored. Patient had excalation of behaviors overnight. At this time, we are simplifying patient's medication regimen to avoid polypharmacy especially in the setting of her recent overdose. We also discussed medication management when she does return home including closer monitoring, less access to her medications through a locked cabinet, and her boyfriend being more in charge of her medications. Patient has an extensive  history of ER visits and hospitalizations in which she has displayed aggression, psychosis, and danger to self while in proximity of a Recruitment consultant. Patient also shows high level of lethality, intensity, and frequency of suicidal attempts with multiple inpatient psychiatric admissions with little benefit so we continue to recommend [facility]. Patient is currently on the [facility] waitlist and we are working closely with LSW with bed availability.    Diagnoses:  Active Hospital problems: Principal Problem:   Bipolar I disorder, current or most recent episode depressed, with psychotic features (HCC) Active Problems:   Asthma   GAD (generalized anxiety disorder)   Suicide attempt (HCC)   Overdose   Drug overdose    Plan   ## Psychiatric Medication Recommendations:  -- Increase Zyprexa to 20mg  po nightly for psychosis -- Start PRN Zyprexa zydis 5 mg BID for agitation -- D/c PRN Risperdal-M tabs for agitation -- D/c Cogentin as she is not showing signs of EPS symptoms at this time -- D/c Trazodone due to concern of oversedation  -- Continue Cymbalta 60 mg daily  -- Continue Gabapentin 600 mg TID for agitated mood/anxiety -- PRN Zyprexa 10mg  IM for agitation -- PRN Ativan 0.5 mg oral BID PRN for agitation  ## Medical Decision Making Capacity:   Capacity was not formally addressed during this encounter  ## Further Work-up:  -- Per primary -- most recent EKG on 08/30/2022 had QtC of 469  ## Disposition:  -- We recommend inpatient psychiatric hospitalization after medical hospitalization. Patient has been involuntarily committed on 09/04/2022.   ## Behavioral / Environmental:  --  To minimize splitting of staff, assign one staff person to communicate all information from the team when feasible. -- Apply the established behavioral rules and protocols consistently throughout the night (see nursing care order for details). Ensure that all  staff members are aware of and adhere to the same guidelines to  avoid confusion and mixed messages.   ## Safety and Observation Level:  - Based on my clinical evaluation, I estimate the patient to be at elevated risk of self harm in the current setting - At this time, we recommend continuing a 1: 1 level of observation. This decision is based on my review of the chart including patient's history and current presentation, interview of the patient, mental status examination, and consideration of suicide risk including evaluating suicidal ideation, plan, intent, suicidal or self-harm behaviors, risk factors, and protective factors. This judgment is based on our ability to directly address suicide risk, implement suicide prevention strategies and develop a safety plan while the patient is in the clinical setting. Please contact our team if there is a concern that risk level has changed.  Suicide risk assessment  Patient has following modifiable risk factors for suicide: under treated depression  and recklessness, which we are addressing by continuing adequate safety precautions while she is hospitalized and adjusting medications   Patient has following non-modifiable or demographic risk factors for suicide: history of suicide attempt, history of self harm behavior, and psychiatric hospitalization  Patient has the following protective factors against suicide: Access to outpatient mental health care  Thank you for this consult request. Recommendations have been communicated to the primary team.  We will follow at this time.   Kizzie Ide, MD PGY-2 Psychiatry   Psychiatric and Social History   Relevant Aspects of Hospital Course:  Admitted on 08/22/2022 for suicide attempt by overdose on her prescription medication.   Patient Report: 09/08/22 Per nursing no behavioral outbursts x24 h  Patient was seen in the late afternoon.  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  We again had a long discussion of the benefits of going to [facility] (DBT groups, safe environment) and the perceived risks (losing apartment, furniture, custody of self, relationship with daughter).  She alternates between saying she has not been suicidal in days, stating she is now only "intermittently" suicidal, and settling on having had suicidal thoughts (which she has suffered from daily for years) with no plan.  She discusses a safety plan she has been working on with her roommate which include no access to medications.  She respectfully request discharge.  Had a long discussion about her history of impulsivity and how 1 to 2 days of feeling better does not outweigh 2 weeks of suicidality.  Discussed that should this be a sustained improvement, will consider discharging, however there is no chance of discharging on the weekend as she will need close wraparound care if we choose to go this route.  She shares that her friend (will be necessary to work on Water engineer) is working until 7.  When I saw, denied SI, HI, and continues to endorse occasional ego-syntonic hallucinations.   Psychiatric ROS Generally reported less sx today than previously, trying to attain goal of discharge.   Past Psychiatric History: Previous Psych Diagnoses: Schizoaffective disorder bipolar type, Anxiety, Autism spectrum disorder, PTSD, Borderline personality disorder Prior inpatient treatment: multiple, 15 ED visits and 4 inpatient admissions since 06/2022, admitted to [facility] from 07/29/22-08/02/2022, has been to [facility] for eight months in 2016, and was discharged from inpatient facility past week Current/prior outpatient treatment: [facility], [facility] team twice a  week Prior rehab hx: denies multiple Psychotherapy hx: [facility] History of suicide: 20-30 times by overdosing on pills, cutting or drinking 409 disinfectant History of homicide or  aggression: denies Psychiatric medication history:  Previously trialed Tegretol, Haldol, Clozaril, Depakote, Invega, Lamictal, Seroquel, asenapine, abilify, vraylar (worsened suicidal thoughts), Lexapro, Zoloft  Neuromodulation history: denies  Social History:  Marital status: Widowed Children: 58 year old daughter Lives: with ex-boyfriend Fayrene Fearing, in two story home Employment: Disability, 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  Access to weapons: None   Exam Findings   Psychiatric Specialty Exam:  Psychiatric Specialty Exam:  Presentation  General Appearance: Appropriate for Environment; Casual  Eye Contact:Good  Speech:Clear and Coherent (rapid, not pressured)  Speech Volume:Normal  Handedness:Right   Mood and Affect  Mood:-- (Good, better)  Affect:-- (Anxious, restricted)   Thought Process  Thought Processes:Coherent; Linear; Goal Directed (goal is dc)  Descriptions of Associations:Intact  Orientation:Full (Time, Place and Person)  Thought Content:Logical  History of Schizophrenia/Schizoaffective disorder:Yes  Duration of Psychotic Symptoms:Greater than six months  Hallucinations:Hallucinations: Auditory (ego syntonic,)  Ideas of Reference:None  Suicidal Thoughts:Suicidal Thoughts: No  Homicidal Thoughts:Homicidal Thoughts: No   Sensorium  Memory:Immediate Good; Recent Good; Remote Good  Judgment:Poor  Insight:Shallow   Executive Functions  Concentration:Fair  Attention Span:Fair  Recall:Fair  Fund of Knowledge:Fair  Language:Fair   Psychomotor Activity  Psychomotor Activity:Psychomotor Activity: Normal   Assets  Assets:Desire for Improvement; Communication Skills   Sleep  Sleep:Sleep: Fair    Physical Exam: Vital signs:  Temp:  [97.7 F (36.5 C)-98 F (36.7 C)] 98 F (36.7 C) (08/03 1500) Pulse Rate:  [98-106] 106 (08/03 1500) Resp:  [18-20] 20 (08/03 1500) BP:  (115-120)/(60-70) 119/65 (08/03 1500) SpO2:  [97 %-99 %] 98 % (08/03 1500) Physical Exam Vitals and nursing note reviewed.  Constitutional:      General: She is not in acute distress.    Appearance: She is not ill-appearing.  Pulmonary:     Effort: Pulmonary effort is normal. No respiratory distress.  Skin:    General: Skin is warm and dry.  Neurological:     General: No focal deficit present.     Mental Status: She is alert.  Psychiatric:        Attention and Perception: Attention normal. She does not perceive visual hallucinations.        Speech: Speech normal.        Behavior: Behavior is not aggressive or combative. Behavior is cooperative.        Thought Content: Thought content does not include homicidal or suicidal ideation.        Judgment: Judgment is impulsive.     Blood pressure 119/65, pulse (!) 106, temperature 98 F (36.7 C), temperature source Oral, resp. rate 20, height 5\' 5"  (1.651 m), weight 98.2 kg, last menstrual period 08/08/2022, SpO2 98%. Body mass index is 36.03 kg/m.   Other History   These have been pulled in through the EMR, reviewed, and updated if appropriate.   Family History:  The patient's family history includes Asthma in her father; Mental illness in her father; PDD in her brother; Seizures in her brother.  Medical History: Past Medical History:  Diagnosis Date   Acid reflux    Adjustment disorder with mixed anxiety and depressed mood 01/31/2022   Adjustment disorder with mixed disturbance of emotions and conduct 08/03/2019   Anxiety    Asthma    last attack 03/13/15 or  03/14/15   Autism    Bipolar 1 disorder, depressed, severe (HCC) 07/25/2021   Carrier of fragile X syndrome    Chronic constipation    Depression    Drug-seeking behavior    Essential tremor    Headache    Ineffective individual coping 05/16/2022   Insomnia 01/12/2022   Intentional drug overdose (HCC) 06/05/2022   Neuromuscular disorder (HCC)    Normocytic anemia  06/05/2022   Overdose 07/22/2017   Overdose of acetaminophen 07/2017   and other meds   Overdose, intentional self-harm, initial encounter (HCC) 07/20/2021   Paranoia (HCC) 04/22/2021   Personality disorder (HCC)    Purposeful non-suicidal drug ingestion (HCC) 06/27/2021   Schizo-affective psychosis (HCC)    Schizoaffective disorder (HCC) 07/29/2022   Schizoaffective disorder, bipolar type (HCC)    Seizures (HCC)    Last seizure December 2017   Skin erythema 04/27/2022   Sleep apnea    Suicidal behavior 07/25/2021   Suicidal ideation    Suicide (HCC) 07/01/2021   Suicide attempt (HCC) 07/04/2021    Surgical History: Past Surgical History:  Procedure Laterality Date   MOUTH SURGERY  2009 or 2010    Medications:   Current Facility-Administered Medications:    0.9 %  sodium chloride infusion, 250 mL, Intravenous, Continuous, Gonfa, Taye T, MD, Stopped at 08/24/22 1347   albuterol (PROVENTIL) (2.5 MG/3ML) 0.083% nebulizer solution 3 mL, 3 mL, Inhalation, Q6H PRN, Alanda Slim, Taye T, MD   cyclobenzaprine (FLEXERIL) tablet 10 mg, 10 mg, Oral, QHS, Gonfa, Taye T, MD, 10 mg at 09/07/22 2113   diclofenac Sodium (VOLTAREN) 1 % topical gel 2 g, 2 g, Topical, QID, Gonfa, Taye T, MD, 2 g at 09/08/22 1756   DULoxetine (CYMBALTA) DR capsule 60 mg, 60 mg, Oral, Daily, Akintayo, Mojeed, MD, 60 mg at 09/08/22 0840   ferrous sulfate tablet 325 mg, 325 mg, Oral, BID WC, Gonfa, Taye T, MD, 325 mg at 09/08/22 1622   gabapentin (NEURONTIN) capsule 600 mg, 600 mg, Oral, TID, Alanda Slim, Taye T, MD, 600 mg at 09/08/22 1622   hydrOXYzine (ATARAX) tablet 25 mg, 25 mg, Oral, TID PRN, Alanda Slim, Taye T, MD, 25 mg at 09/08/22 1512   ibuprofen (ADVIL) tablet 600 mg, 600 mg, Oral, TID PRN, Alanda Slim, Taye T, MD, 600 mg at 09/08/22 1416   LORazepam (ATIVAN) tablet 0.5 mg, 0.5 mg, Oral, BID PRN, Alanda Slim, Taye T, MD, 0.5 mg at 09/07/22 2113   mometasone-formoterol (DULERA) 200-5 MCG/ACT inhaler 2 puff, 2 puff, Inhalation, BID,  Alanda Slim, Taye T, MD, 2 puff at 09/08/22 0840   mupirocin cream (BACTROBAN) 2 %, , Topical, Daily, Marguerita Merles [place], DO, Given at 09/06/22 0940   OLANZapine (ZYPREXA) injection 10 mg, 10 mg, Intramuscular, Once PRN, Akintayo, Mojeed, MD   OLANZapine (ZYPREXA) tablet 20 mg, 20 mg, Oral, Daily, Mariel Craft, MD, 20 mg at 09/07/22 2112   OLANZapine zydis (ZYPREXA) disintegrating tablet 5 mg, 5 mg, Oral, BID PRN, Mariel Craft, MD   ondansetron (ZOFRAN-ODT) disintegrating tablet 4 mg, 4 mg, Oral, Q8H PRN, Sheikh, Omair Latif, DO, 4 mg at 09/01/22 2025   Oral care mouth rinse, 15 mL, Mouth Rinse, PRN, Osvaldo Shipper, MD   pantoprazole (PROTONIX) EC tablet 40 mg, 40 mg, Oral, QAC breakfast, Gonfa, Taye T, MD, 40 mg at 09/08/22 0840   polyethylene glycol (MIRALAX / GLYCOLAX) packet 17 g, 17 g, Oral, BID PRN, Candelaria Stagers T, MD, 17 g at 09/08/22 0843   prazosin (MINIPRESS) capsule 1 mg,  1 mg, Oral, QHS, Gonfa, Taye T, MD, 1 mg at 09/07/22 2120   senna-docusate (Senokot-S) tablet 1 tablet, 1 tablet, Oral, BID PRN, Candelaria Stagers T, MD, 1 tablet at 09/08/22 0843   sodium bicarbonate tablet 650 mg, 650 mg, Oral, TID, Candelaria Stagers T, MD, 650 mg at 09/08/22 1622  Allergies: Allergies  Allergen Reactions   Bee Venom Anaphylaxis   Coconut Flavor Anaphylaxis and Rash   Fish Allergy Anaphylaxis   Geodon [Ziprasidone Hcl] Other (See Comments)    Pt states that this medication causes paralysis of the mouth.     Haloperidol And Related Other (See Comments)    Pt states that this medication causes paralysis of the mouth, jaw locks up   Lithobid [Lithium] Other (See Comments)    Seizure-like activity    Roxicodone [Oxycodone] Other (See Comments)    Hallucinations    Seroquel [Quetiapine] Other (See Comments)    Severe drowsiness - Pt currently taking 12.5 mg BID, she is ok with this dose   Shellfish Allergy Anaphylaxis   Phenergan [Promethazine Hcl] Other (See Comments)    Chest pain     Prilosec  [Omeprazole] Nausea And Vomiting and Other (See Comments)    Pt can take protonix with no problems    Sulfa Antibiotics Other (See Comments)    Chest pain    Tegretol [Carbamazepine] Nausea And Vomiting   Prozac [Fluoxetine] Other (See Comments)    Increased Depression and Suicidal thoughts   Tape Other (See Comments)    Skin tears, can only tolerate paper tape.   Tylenol [Acetaminophen] Rash and Other (See Comments)    Rash on face

## 2022-09-08 NOTE — Progress Notes (Signed)
[facility] PROGRESS NOTE   [name] ZOX:[id number] DOB: [birth year] DOA: 08/22/2022  PCP: [name]  Brief History/Interval Summary: 32 y.o. F with PMH of adjustment disorder with mixed anxiety and depressed mood, autism, bipolar 1 disorder, fragile x syndrome, seizures not on meds, schizoaffective disorder and multiple suicide attempts in the last year who presented to the ED after ingesting numerous medications in a suicide attempt.  She reported taking #30 metoprolol 25mg  tablets, #60 Hydroxyzine 25mg , #7 Lexapro 20mg , #21 Gabapentin 600mg , #7 Flexeril 10mg , #7 protonix 40mg  and #7 Prazosin 1mg .   She was slightly bradycardic and hypotensive on EMS arrival which improved with fluids.  Poison control was contacted and as she was within 2hrs of ingestion was given activated charcoal.  Placed under IVC in ED.  Poison control also recommended ICU admission for close Qtc and electrolyte monitoring and possible pressors.  At the time of admission, pt was somnolent but arousable to voice and following commands. She was intubated and started on vasopressor and glucagon.  Also started on ceftriaxone for possible pneumonia.  Patient was extubated on 7/18 and transferred to the floor on 7/19. She was transferred to [facility] service on 08/25/22.  Psychiatry continues to follow.  They recommend inpatient psychiatric hospitalization.   Consultants: Psychiatry     Subjective/Interval History: No new issues reported.     Assessment/Plan:  Suicidal Attempt with intentional overdose:  Overdosed with multiple medications including #30 metoprolol 25mg  tablets, #60 Hydroxyzine 25mg , #7 Lexapro 20mg , #21 Gabapentin 600mg , #7 Flexeril 10mg , #7 protonix 40mg  and #7 Prazosin 1mg .  Poison control consulted. Received charcoal. Denies ETOH or illicit drug use.  Extended UDS negative. Required vasopressor, glucagon, intubation and mechanical ventilation 7/17-7/18.  Now stable. Currently  she is involuntarily committed. Psychiatry is following closely. She continues to mention suicidal ideation.   Anxiety and Depression, Schizoaffective Disorder, Adjustment Disorder, bipolar disorder, fibromyalgia and auditory hallucination:  Patient noted to be on Cogentin, Cymbalta, Neurontin, Zyprexa, prazosin. Psychiatry follows closely. She was on Lexapro prior to admission which has been changed over to Cymbalta. -Patient started hearing voices in her head that were "too much for her to handle" and then she threatened "that she was in a kill and strangle one of the staff members" -Intermittently has to be placed in 4-point restraints and given IM Zyprexa.  She still has a one-to-one sitter at bedside and will continue -Cymbalta has been increased from 30 mg to 60 mg daily and will be continuing olanzapine 10 mg p.o. daily nightly for psychosis and continuing gabapentin 600 mg 3 times daily for agitated mood/anxiety Continues to have periodic episodes of agitation.  Await placement to inpatient psychiatric facility.   CAP Completed course of antibiotics.  Respiratory status is stable.   Essential hypertension  Normotensive. Was apparently on metoprolol prior to admission which is currently on hold. Labs were ordered but patient continues to refuse.   Sinus Tachycardia -Resolved.   Metabolic Acidosis -Improved.  Noted to be on sodium bicarbonate.   Hypomagnesemia Monitor periodically.   Microcytic Anemia Continue ferrous sulfate.  Blood work was ordered but patient refuses.  Hemoglobin was stable previously.     History of Seizure Disorder -Stable.  Not on meds.   History of Asthma -Stable.   GERD/GI Prophylaxis -Continue PPI with Pantoprazole and Famotidine 20 mg po BID   Obesity -Complicates overall prognosis and care -Estimated body mass index is 36.03 kg/m as calculated from the following:   Height as of  this encounter: 5\' 5"  (1.651 m).   Weight as of this  encounter: 98.2 kg.  -Weight Loss and Dietary Counseling given   DVT prophylaxis: SCDs  Code Status: Full Code Family Communication: No family present at bedside  Disposition Plan: Awaiting inpatient psychiatric hospitalization.    Medications: Scheduled:  cyclobenzaprine  10 mg Oral QHS   diclofenac Sodium  2 g Topical QID   DULoxetine  60 mg Oral Daily   ferrous sulfate  325 mg Oral BID WC   gabapentin  600 mg Oral TID   mometasone-formoterol  2 puff Inhalation BID   mupirocin cream   Topical Daily   OLANZapine  20 mg Oral Daily   pantoprazole  40 mg Oral QAC breakfast   prazosin  1 mg Oral QHS   sodium bicarbonate  650 mg Oral TID   Continuous:  sodium chloride Stopped (08/24/22 1347)   [name], hydrOXYzine, ibuprofen, LORazepam, LORazepam, OLANZapine, OLANZapine zydis, ondansetron, mouth rinse, polyethylene glycol, senna-docusate  Antibiotics: Anti-infectives (From admission, onward)    Start     Dose/Rate Route Frequency Ordered Stop   08/26/22 1415  amoxicillin-clavulanate (AUGMENTIN) 875-125 MG per tablet 1 tablet        1 tablet Oral Every 12 hours 08/26/22 1316 08/27/22 2205   08/23/22 1200  cefTRIAXone (ROCEPHIN) 2 g in sodium chloride 0.9 % 100 mL IVPB  Status:  Discontinued        2 g 200 mL/hr over 30 Minutes Intravenous Every 24 hours 08/23/22 0931 08/26/22 1316       Objective:  Vital Signs  Vitals:   09/07/22 0820 09/07/22 1933 09/07/22 2019 09/08/22 0733  BP:   120/60 115/70  Pulse:  99  98  Resp:  18 18 20   Temp:   97.7 F (36.5 C) 97.9 F (36.6 C)  TempSrc:   Oral Oral  SpO2: 98% 98% 99% 97%  Weight:      Height:        Intake/Output Summary (Last 24 hours) at 09/08/2022 1045 Last data filed at 09/08/2022 0811 Gross per 24 hour  Intake 720 ml  Output --  Net 720 ml   Filed Weights   09/01/22 0300 09/02/22 0500 09/03/22 0402  Weight: 98.8 kg 99 kg 98.2 kg   Awake alert.  In no distress.  Lab Results:  Data Reviewed: I have  personally reviewed following labs and reports of the imaging studies  CBC: No results for input(s): "WBC", "NEUTROABS", "HGB", "HCT", "MCV", "PLT" in the last 168 hours.  Basic Metabolic Panel: Recent Labs  Lab 09/05/22 0816  CREATININE 0.78    GFR: Estimated Creatinine Clearance: 116.1 mL/min (by C-G formula based on SCr of 0.78 mg/dL).  Liver Function Tests: No results for input(s): "AST", "ALT", "ALKPHOS", "BILITOT", "PROT", "ALBUMIN" in the last 168 hours.    Radiology Studies: No results found.     LOS: 17 days   Jaquel Coomer [employer] on [email or web address]  09/08/2022, 10:45 AM

## 2022-09-09 DIAGNOSIS — F315 Bipolar disorder, current episode depressed, severe, with psychotic features: Secondary | ICD-10-CM | POA: Diagnosis not present

## 2022-09-09 NOTE — Progress Notes (Signed)
[facility] PROGRESS NOTE  [name] (DOB: 08/28/1990) ZOX:[id number] PCP: [name]  Brief Narrative: 32 y.o. F with PMH of adjustment disorder with mixed anxiety and depressed mood, autism, bipolar 1 disorder, fragile x syndrome, seizures not on meds, schizoaffective disorder and multiple suicide attempts in the last year who presented to the ED after ingesting numerous medications in a suicide attempt. She reported taking #30 metoprolol 25mg  tablets, #60 Hydroxyzine 25mg , #7 Lexapro 20mg , #21 Gabapentin 600mg , #7 Flexeril 10mg , #7 protonix 40mg  and #7 Prazosin 1mg . She was slightly bradycardic and hypotensive on EMS arrival which improved with fluids. Poison control was contacted and as she was within 2hrs of ingestion was given activated charcoal. Placed under IVC in ED. Poison control also recommended ICU admission for close Qtc and electrolyte monitoring and possible pressors. At the time of admission, pt was somnolent but arousable to voice and following commands. She was intubated and started on vasopressor and glucagon. Also started on ceftriaxone for possible pneumonia. Patient was extubated on 7/18 and transferred to the floor on 7/19. She was transferred to [facility] service on 08/25/22. Psychiatry continues to follow. They recommend inpatient psychiatric hospitalization.   Subjective: No events noted, pt eating lunch, not interested in speaking.  Objective: BP 110/64 (BP Location: Right Arm)   Pulse (!) 102   Temp 97.7 F (36.5 C) (Oral)   Resp 16   Ht 5\' 5"  (1.651 m)   Wt 98.2 kg   LMP 08/08/2022 (Approximate)   SpO2 98%   BMI 36.03 kg/m   Gen: No distress eating sandwich   Assessment & Plan: Suicidal Attempt with intentional overdose:  Overdosed with multiple medications including #30 metoprolol 25mg  tablets, #60 Hydroxyzine 25mg , #7 Lexapro 20mg , #21 Gabapentin 600mg , #7 Flexeril 10mg , #7 protonix 40mg  and #7 Prazosin 1mg .  Poison control consulted.  Received charcoal. Denies ETOH or illicit drug use.  Extended UDS negative. Required vasopressor, glucagon, intubation and mechanical ventilation 7/17-7/18.  Now stable. Currently she is involuntarily committed. Psychiatry is following closely. She continues to mention suicidal ideation.   Anxiety and Depression, Schizoaffective Disorder, Adjustment Disorder, bipolar disorder, fibromyalgia and auditory hallucination:  Patient noted to be on Cogentin, Cymbalta, Neurontin, Zyprexa, prazosin. Psychiatry follows closely. She was on Lexapro prior to admission which has been changed over to Cymbalta. -Patient started hearing voices in her head that were "too much for her to handle" and then she threatened "that she was in a kill and strangle one of the staff members" -Intermittently has to be placed in 4-point restraints and given IM Zyprexa.  She still has a one-to-one sitter at bedside and will continue -Cymbalta has been increased from 30 mg to 60 mg daily and will be continuing olanzapine 10 mg p.o. daily nightly for psychosis and continuing gabapentin 600 mg 3 times daily for agitated mood/anxiety - Continues to have periodic episodes of agitation.  Await placement to inpatient psychiatric facility.   CAP Completed course of antibiotics.  Respiratory status is stable.   Essential hypertension  Normotensive. Was apparently on metoprolol prior to admission which is currently on hold. Labs were ordered but patient continues to refuse.   Sinus Tachycardia -Resolved.   Metabolic Acidosis -Improved.  Noted to be on sodium bicarbonate.   Hypomagnesemia Monitor periodically.   Microcytic Anemia Continue ferrous sulfate.  Blood work was ordered but patient refuses.  Hemoglobin was stable previously.     History of Seizure Disorder -Stable.  Not on meds.   History of Asthma -  Stable.   GERD/GI Prophylaxis -Continue PPI with Pantoprazole and Famotidine 20 mg po BID    Obesity -Complicates overall prognosis and care -Estimated body mass index is 36.03 kg/m as calculated from the following:   Height as of this encounter: 5\' 5"  (1.651 m).   Weight as of this encounter: 98.2 kg.  -Weight Loss and Dietary Counseling given  Tyrone Nine, MD [facility] [email or web address] 09/09/2022, 3:49 PM

## 2022-09-09 NOTE — Plan of Care (Signed)
  Problem: Education: Goal: Knowledge of General Education information will improve Description: Including pain rating scale, medication(s)/side effects and non-pharmacologic comfort measures Outcome: Progressing   Problem: Health Behavior/Discharge Planning: Goal: Ability to manage health-related needs will improve Outcome: Progressing   Problem: Activity: Goal: Risk for activity intolerance will decrease Outcome: Progressing   

## 2022-09-09 NOTE — Consult Note (Signed)
[name] Psychiatry Consult Evaluation  Service Date: September 09, 2022 LOS:  LOS: 18 days    Primary Psychiatric Diagnoses  Schizoaffective disorder, bipolar type  GAD  Assessment  [name] is a 32 y.o. female with a past psychiatric history of borderline personality disorder, schizoaffective disorder bipolar type, and PTSD.  She has had numerous behavioral health admissions and an extensive history of suicide attempts.  Patient presents to [facility] ED for an attempted overdose on her prescription medications.  During this hospitalization, patient has required restraints due to increased agitation, disruptive behavior, and increased risk for harming herself. Patient reports command hallucinations, telling her to harm herself and others. She has had multiple suicide attempts this past year and has multiple risk factors, including severe mood instability, impulsivity, and significant interpersonal difficulties, all of which contribute to an increased risk of suicide. Although adequate safety measures remain in place, it is important to acknowledge the behavioral component to patient's disruptive behavior on the unit, consistent with her diagnosis of BPD. There are inconsistencies in patient's report when seen by different members of the team.   Patient had excalation of behaviors overnight. At this time, we are simplifying patient's medication regimen to avoid polypharmacy especially in the setting of her recent overdose. We also discussed medication management when she does return home including closer monitoring, less access to her medications through a locked cabinet, and her boyfriend being more in charge of her medications. Patient has an extensive history of ER visits and hospitalizations in which she has displayed aggression, psychosis, and danger to self while in proximity of a Recruitment consultant. Patient also shows high level of lethality, intensity, and frequency of suicidal attempts  with multiple inpatient psychiatric admissions with little benefit so we continue to recommend [facility]. Patient is currently on the [facility] waitlist and we are working closely with LSW with bed availability.    Diagnoses:  Active Hospital problems: Principal Problem:   Bipolar I disorder, current or most recent episode depressed, with psychotic features (HCC) Active Problems:   Asthma   GAD (generalized anxiety disorder)   Suicide attempt (HCC)   Overdose   Drug overdose    Plan   ## Psychiatric Medication Recommendations:  -- Increase Zyprexa to 20mg  po nightly for psychosis -- Start PRN Zyprexa zydis 5 mg BID for agitation -- D/c PRN Risperdal-M tabs for agitation -- D/c Cogentin as she is not showing signs of EPS symptoms at this time -- D/c Trazodone due to concern of oversedation  -- Continue Cymbalta 60 mg daily  -- Continue Gabapentin 600 mg TID for agitated mood/anxiety -- PRN Zyprexa 10mg  IM for agitation -- PRN Ativan 0.5 mg oral BID PRN for agitation  ## Medical Decision Making Capacity:   Capacity was not formally addressed during this encounter  ## Further Work-up:  -- Per primary -- most recent EKG on 08/30/2022 had QtC of 469  ## Disposition:  -- We recommend inpatient psychiatric hospitalization after medical hospitalization. Patient has been involuntarily committed on 09/04/2022.   ## Behavioral / Environmental:  --  To minimize splitting of staff, assign one staff person to communicate all information from the team when feasible. -- Apply the established behavioral rules and protocols consistently throughout the night (see nursing care order for details). Ensure that all staff members are aware of and adhere to the same guidelines to avoid confusion and mixed messages.   ## Safety and Observation Level:  - Based on my clinical evaluation, I  estimate the patient to be at elevated risk of self harm in the current setting - At this time, we recommend continuing a 1:  1 level of observation. This decision is based on my review of the chart including patient's history and current presentation, interview of the patient, mental status examination, and consideration of suicide risk including evaluating suicidal ideation, plan, intent, suicidal or self-harm behaviors, risk factors, and protective factors. This judgment is based on our ability to directly address suicide risk, implement suicide prevention strategies and develop a safety plan while the patient is in the clinical setting. Please contact our team if there is a concern that risk level has changed.  Suicide risk assessment  Patient has following modifiable risk factors for suicide: under treated depression  and recklessness, which we are addressing by continuing adequate safety precautions while she is hospitalized and adjusting medications   Patient has following non-modifiable or demographic risk factors for suicide: history of suicide attempt, history of self harm behavior, and psychiatric hospitalization  Patient has the following protective factors against suicide: Access to outpatient mental health care  Thank you for this consult request. Recommendations have been communicated to the primary team.  We will follow at this time.   Kizzie Ide, MD PGY-2 Psychiatry   Psychiatric and Social History   Relevant Aspects of Hospital Course:  Admitted on 08/22/2022 for suicide attempt by overdose on her prescription medication.   Patient Report: 09/08/22 Per nursing no behavioral outbursts x24 h  Patient seen in early afternoon.  Discussed that with the psychiatry team unable to get in touch with her friend yesterday despite calling after 7 PM as requested.  Asked patient to get a good time to call call him tomorrow 8/5.  Minimal change from yesterday-continues to spend all day in bed.  Have been encouraging physical activity as much as possible within the hospital.  She remains fixated on leaving the  hospital and continues to deny SI, HI, AH/VH.   Psychiatric ROS Generally reported less sx today than earlier in hospital stay, trying to attain goal of discharge.   Past Psychiatric History: Previous Psych Diagnoses: Schizoaffective disorder bipolar type, Anxiety, Autism spectrum disorder, PTSD, Borderline personality disorder Prior inpatient treatment: multiple, 15 ED visits and 4 inpatient admissions since 06/2022, admitted to [facility] from 07/29/22-08/02/2022, has been to [facility] for eight months in 2016, and was discharged from inpatient facility past week Current/prior outpatient treatment: [facility], CST team twice a week Prior rehab hx: denies multiple Psychotherapy hx: [facility] History of suicide: 20-30 times by overdosing on pills, cutting or drinking 409 disinfectant History of homicide or aggression: denies Psychiatric medication history:  Previously trialed Tegretol, Haldol, Clozaril, Depakote, Invega, Lamictal, Seroquel, asenapine, abilify, vraylar (worsened suicidal thoughts), Lexapro, Zoloft  Neuromodulation history: denies  Social History:  Marital status: Widowed Children: 61 year old daughter Lives: with ex-boyfriend Fayrene Fearing, in two story home Employment: Disability, 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  Access to weapons: None   Exam Findings   Psychiatric Specialty Exam:  Psychiatric Specialty Exam:  Presentation  General Appearance: Appropriate for Environment; Casual  Eye Contact:Good  Speech:Clear and Coherent  Speech Volume:Normal  Handedness:Right   Mood and Affect  Mood:-- (Really good today)  Affect:-- (restricted)   Thought Process  Thought Processes:Linear; Goal Directed; Coherent  Descriptions of Associations:Intact  Orientation:Full (Time, Place and Person)  Thought Content:Logical  History of Schizophrenia/Schizoaffective disorder:No  Duration of  Psychotic  Symptoms:Greater than six months  Hallucinations:Hallucinations: Auditory Description of Auditory Hallucinations: static  Ideas of Reference:None  Suicidal Thoughts:Suicidal Thoughts: No  Homicidal Thoughts:Homicidal Thoughts: No   Sensorium  Memory:Immediate Good; Recent Good; Remote Good  Judgment:Poor  Insight:Shallow   Executive Functions  Concentration:Fair  Attention Span:Fair  Recall:Fair  Fund of Knowledge:Fair  Language:Fair   Psychomotor Activity  Psychomotor Activity:Psychomotor Activity: Normal   Assets  Assets:Communication Skills; Desire for Improvement   Sleep  Sleep:Sleep: Fair    Physical Exam: Vital signs:  Temp:  [97.7 F (36.5 C)-98.6 F (37 C)] 97.7 F (36.5 C) (08/04 1538) Pulse Rate:  [90-109] 102 (08/04 1538) Resp:  [16-17] 16 (08/04 1538) BP: (104-115)/(57-71) 110/64 (08/04 1538) SpO2:  [98 %-100 %] 98 % (08/04 1538) Physical Exam Vitals and nursing note reviewed.  Constitutional:      General: She is not in acute distress.    Appearance: She is not ill-appearing.  Pulmonary:     Effort: Pulmonary effort is normal. No respiratory distress.  Skin:    General: Skin is warm and dry.  Neurological:     General: No focal deficit present.     Mental Status: She is alert.  Psychiatric:        Attention and Perception: Attention normal. She does not perceive visual hallucinations.        Speech: Speech normal.        Behavior: Behavior is not aggressive or combative. Behavior is cooperative.        Thought Content: Thought content does not include homicidal or suicidal ideation.        Judgment: Judgment is impulsive.     Blood pressure 110/64, pulse (!) 102, temperature 97.7 F (36.5 C), temperature source Oral, resp. rate 16, height 5\' 5"  (1.651 m), weight 98.2 kg, last menstrual period 08/08/2022, SpO2 98%. Body mass index is 36.03 kg/m.   Other History   These have been pulled in through the EMR, reviewed, and  updated if appropriate.   Family History:  The patient's family history includes Asthma in her father; Mental illness in her father; PDD in her brother; Seizures in her brother.  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    Chronic constipation    Depression    Drug-seeking behavior    Essential tremor    Headache    Ineffective individual coping 05/16/2022   Insomnia 01/12/2022   Intentional drug overdose (HCC) 06/05/2022   Neuromuscular disorder (HCC)    Normocytic anemia 06/05/2022   Overdose 07/22/2017   Overdose of acetaminophen 07/2017   and other meds   Overdose, intentional self-harm, initial encounter (HCC) 07/20/2021   Paranoia (HCC) 04/22/2021   Personality disorder (HCC)    Purposeful non-suicidal drug ingestion (HCC) 06/27/2021   Schizo-affective psychosis (HCC)    Schizoaffective disorder (HCC) 07/29/2022   Schizoaffective disorder, bipolar type (HCC)    Seizures (HCC)    Last seizure December 2017   Skin erythema 04/27/2022   Sleep apnea    Suicidal behavior 07/25/2021   Suicidal ideation    Suicide (HCC) 07/01/2021   Suicide attempt (HCC) 07/04/2021  Surgical History: Past Surgical History:  Procedure Laterality Date   MOUTH SURGERY  2009 or 2010    Medications:   Current Facility-Administered Medications:    0.9 %  sodium chloride infusion, 250 mL, Intravenous, Continuous, Gonfa, Taye T, MD, Stopped at 08/24/22 1347   albuterol (PROVENTIL) (2.5 MG/3ML) 0.083% nebulizer solution 3 mL, 3 mL, Inhalation, Q6H PRN, Alanda Slim, Taye T, MD   cyclobenzaprine (FLEXERIL) tablet 10 mg, 10 mg, Oral, QHS, Gonfa, Taye T, MD, 10 mg at  09/08/22 2113   diclofenac Sodium (VOLTAREN) 1 % topical gel 2 g, 2 g, Topical, QID, Gonfa, Taye T, MD, 2 g at 09/09/22 1739   DULoxetine (CYMBALTA) DR capsule 60 mg, 60 mg, Oral, Daily, Akintayo, Mojeed, MD, 60 mg at 09/09/22 0834   ferrous sulfate tablet 325 mg, 325 mg, Oral, BID WC, Gonfa, Taye T, MD, 325 mg at 09/09/22 1739   gabapentin (NEURONTIN) capsule 600 mg, 600 mg, Oral, TID, Alanda Slim, Taye T, MD, 600 mg at 09/09/22 1600   hydrOXYzine (ATARAX) tablet 25 mg, 25 mg, Oral, TID PRN, Alanda Slim, Taye T, MD, 25 mg at 09/08/22 1930   ibuprofen (ADVIL) tablet 600 mg, 600 mg, Oral, TID PRN, Alanda Slim, Taye T, MD, 600 mg at 09/08/22 1416   LORazepam (ATIVAN) tablet 0.5 mg, 0.5 mg, Oral, BID PRN, Alanda Slim, Taye T, MD, 0.5 mg at 09/09/22 0835   mometasone-formoterol (DULERA) 200-5 MCG/ACT inhaler 2 puff, 2 puff, Inhalation, BID, Alanda Slim, Taye T, MD, 2 puff at 09/08/22 2103   mupirocin cream (BACTROBAN) 2 %, , Topical, Daily, Sheikh, Omair Latif, DO, Given at 09/09/22 1000   OLANZapine (ZYPREXA) injection 10 mg, 10 mg, Intramuscular, Once PRN, Akintayo, Mojeed, MD   OLANZapine (ZYPREXA) tablet 20 mg, 20 mg, Oral, Daily, Mariel Craft, MD, 20 mg at 09/08/22 2114   OLANZapine zydis (ZYPREXA) disintegrating tablet 5 mg, 5 mg, Oral, BID PRN, Mariel Craft, MD   ondansetron (ZOFRAN-ODT) disintegrating tablet 4 mg, 4 mg, Oral, Q8H PRN, Sheikh, Omair Latif, DO, 4 mg at 09/01/22 2025   Oral care mouth rinse, 15 mL, Mouth Rinse, PRN, Osvaldo Shipper, MD   pantoprazole (PROTONIX) EC tablet 40 mg, 40 mg, Oral, QAC breakfast, Gonfa, Taye T, MD, 40 mg at 09/09/22 0833   polyethylene glycol (MIRALAX / GLYCOLAX) packet 17 g, 17 g, Oral, BID PRN, Alanda Slim, Taye T, MD, 17 g at 09/09/22 0846   prazosin (MINIPRESS) capsule 1 mg, 1 mg, Oral, QHS, Gonfa, Taye T, MD, 1 mg at 09/08/22 2114   senna-docusate (Senokot-S) tablet 1 tablet, 1 tablet, Oral, BID PRN, Candelaria Stagers T, MD, 1 tablet at 09/09/22 0847   sodium bicarbonate tablet 650  mg, 650 mg, Oral, TID, Candelaria Stagers T, MD, 650 mg at 09/09/22 1600  Allergies: Allergies  Allergen Reactions   Bee Venom Anaphylaxis   Coconut Flavor Anaphylaxis and Rash   Fish Allergy Anaphylaxis   Geodon [Ziprasidone Hcl] Other (See Comments)    Pt states that this medication causes paralysis of the mouth.     Haloperidol And Related Other (See Comments)    Pt states that this medication causes paralysis of the mouth, jaw locks up   Lithobid [Lithium] Other (See Comments)    Seizure-like activity    Roxicodone [Oxycodone] Other (See Comments)    Hallucinations    Seroquel [Quetiapine] Other (See Comments)    Severe drowsiness - Pt currently taking 12.5 mg BID, she is ok with this dose   Shellfish Allergy Anaphylaxis  Phenergan [Promethazine Hcl] Other (See Comments)    Chest pain     Prilosec [Omeprazole] Nausea And Vomiting and Other (See Comments)    Pt can take protonix with no problems    Sulfa Antibiotics Other (See Comments)    Chest pain    Tegretol [Carbamazepine] Nausea And Vomiting   Prozac [Fluoxetine] Other (See Comments)    Increased Depression and Suicidal thoughts   Tape Other (See Comments)    Skin tears, can only tolerate paper tape.   Tylenol [Acetaminophen] Rash and Other (See Comments)    Rash on face

## 2022-09-10 DIAGNOSIS — F315 Bipolar disorder, current episode depressed, severe, with psychotic features: Secondary | ICD-10-CM | POA: Diagnosis not present

## 2022-09-10 NOTE — Progress Notes (Addendum)
Pt stated she wanted to kill her self and plan to do so with her blanket. This limited pt. to one blanket

## 2022-09-10 NOTE — Plan of Care (Signed)
  Problem: Health Behavior/Discharge Planning: Goal: Ability to manage health-related needs will improve Outcome: Progressing   Problem: Activity: Goal: Risk for activity intolerance will decrease Outcome: Progressing   Problem: Coping: Goal: Level of anxiety will decrease Outcome: Progressing   

## 2022-09-10 NOTE — Progress Notes (Signed)
Pt. Made scratches on left forearm using nails

## 2022-09-10 NOTE — Consult Note (Signed)
[name] Consult Evaluation  Service Date: September 10, 2022 LOS:  LOS: 19 days    Primary Psychiatric Diagnoses  Schizoaffective disorder, bipolar type  GAD  Assessment  Sumner Greeley is a 32 y.o. female with a past psychiatric history of borderline personality disorder, schizoaffective disorder bipolar type, and PTSD.  She has had numerous behavioral health admissions and an extensive history of suicide attempts.  Patient presents to [facility] ED for an attempted overdose on her prescription medications.  During this hospitalization, patient has required restraints due to increased agitation, disruptive behavior, and increased risk for harming herself. Patient reports command hallucinations, telling her to harm herself and others. She has had multiple suicide attempts this past year and has multiple risk factors, including severe mood instability, impulsivity, and significant interpersonal difficulties, all of which contribute to an increased risk of suicide. Although adequate safety measures remain in place, it is important to acknowledge the behavioral component to patient's disruptive behavior on the unit, consistent with her diagnosis of BPD. There are inconsistencies in patient's report when seen by different members of the team.   We are simplifying patient's medication regimen to avoid polypharmacy especially in the setting of her recent overdose. We also discussed medication management when she does return home including closer monitoring, less access to her medications through a locked cabinet, and her boyfriend being more in charge of her medications. Patient has an extensive history of ER visits and hospitalizations in which she has displayed aggression, psychosis, and danger to self while in proximity of a Recruitment consultant. Patient also shows high level of lethality, intensity, and frequency of suicidal attempts with multiple inpatient psychiatric admissions with little  benefit so we continue to recommend [facility]. Patient is currently on the [facility] waitlist and we are working closely with LSW with bed availability.   8/5: Patient continues to want to leave the hospital, denying SI, HI, and AVH, like yesterday.  We are trying to connect with patient's CST team and friend that she is residing with for further safe discharge planning.  Diagnoses:  Active Hospital problems: Principal Problem:   Bipolar I disorder, current or most recent episode depressed, with psychotic features (HCC) Active Problems:   Asthma   GAD (generalized anxiety disorder)   Suicide attempt (HCC)   Overdose   Drug overdose    Plan   ## Psychiatric Medication Recommendations:  -- Continue Zyprexa 20mg  po nightly for psychosis -- Continue Cymbalta 60 mg daily  -- Continue Gabapentin 600 mg TID for agitated mood/anxiety -- PRN Zyprexa 10mg  IM for agitation -- PRN Ativan 0.5 mg oral BID PRN for agitation -- PRN Zyprexa zydis 5 mg BID for agitation  ## Medical Decision Making Capacity:   Capacity was not formally addressed during this encounter  ## Further Work-up:  -- Per primary -- most recent EKG on 08/30/2022 had QtC of 469  ## Disposition:  -- We recommend inpatient psychiatric hospitalization after medical hospitalization. Patient has been involuntarily committed on 09/04/2022.  -Attempted to contact friend Fayrene Fearing whom she lives with for safety planning, but he did not answer the call.  Will attempt to call at a later time.  ## Behavioral / Environmental:  --  To minimize splitting of staff, assign one staff person to communicate all information from the team when feasible. -- Apply the established behavioral rules and protocols consistently throughout the night (see nursing care order for details). Ensure that all staff members are aware of and adhere to  the same guidelines to avoid confusion and mixed messages.   ## Safety and Observation Level:  - Based on my clinical  evaluation, I estimate the patient to be at elevated risk of self harm in the current setting - At this time, we recommend continuing a 1: 1 level of observation. This decision is based on my review of the chart including patient's history and current presentation, interview of the patient, mental status examination, and consideration of suicide risk including evaluating suicidal ideation, plan, intent, suicidal or self-harm behaviors, risk factors, and protective factors. This judgment is based on our ability to directly address suicide risk, implement suicide prevention strategies and develop a safety plan while the patient is in the clinical setting. Please contact our team if there is a concern that risk level has changed.  Suicide risk assessment  Patient has following modifiable risk factors for suicide: under treated depression  and recklessness, which we are addressing by continuing adequate safety precautions while she is hospitalized and adjusting medications   Patient has following non-modifiable or demographic risk factors for suicide: history of suicide attempt, history of self harm behavior, and psychiatric hospitalization  Patient has the following protective factors against suicide: Access to outpatient mental health care  Thank you for this consult request. Recommendations have been communicated to the primary team.  We will follow at this time.   Kizzie Ide, MD PGY-2 Psychiatry   Psychiatric and Social History   Relevant Aspects of Hospital Course:  Admitted on 08/22/2022 for suicide attempt by overdose on her prescription medication.   Patient Report:  Per nursing no behavioral outbursts x48 h.  Patient seen this morning.  Patient reports feeling fine, no behavioral overnight events noted.  She notes sleeping and eating well.  She feels her medications have been helpful and her symptoms of irritability and depression.  She denies adverse effects from her psychotropic  medications.  She denies SI, HI, and AVH.  She reports wanting to go home and states "if I do not go home I will lose everything including my daughter and my career".  She states that she would be able to have her friend Fayrene Fearing more closely monitor her medication management and suggest incorporating lock cabinets and visual monitoring when she places her meds in the daily medication container.  She agrees for provider to contact Fayrene Fearing to make sure he is aware of these changes.  Psychiatric ROS Generally reported less sx today than earlier in hospital stay, trying to attain goal of discharge.   Past Psychiatric History: Previous Psych Diagnoses: Schizoaffective disorder bipolar type, Anxiety, Autism spectrum disorder, PTSD, Borderline personality disorder Prior inpatient treatment: multiple, 15 ED visits and 4 inpatient admissions since 06/2022, admitted to [facility] from 07/29/22-08/02/2022, has been to [facility] for eight months in 2016, and was discharged from inpatient facility past week Current/prior outpatient treatment: [facility], CST team twice a week Prior rehab hx: denies multiple Psychotherapy hx: [facility] History of suicide: 20-30 times by overdosing on pills, cutting or drinking 409 disinfectant History of homicide or aggression: denies Psychiatric medication history:  Previously trialed Tegretol, Haldol, Clozaril, Depakote, Invega, Lamictal, Seroquel, asenapine, abilify, vraylar (worsened suicidal thoughts), Lexapro, Zoloft  Neuromodulation history: denies  Social History:  Marital status: Widowed Children: 25 year old daughter Lives: with ex-boyfriend Fayrene Fearing, in two story home Employment: Disability, 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  Access to weapons: None   Exam Findings  Psychiatric Specialty Exam:  Psychiatric Specialty Exam:  Presentation  General Appearance: Appropriate for Environment; Casual  Eye  Contact:Good  Speech:Clear and Coherent  Speech Volume:Normal  Handedness:Right   Mood and Affect  Mood:-- euthymic  Affect:-- congruent   Thought Process  Thought Processes:Linear; Goal Directed; Coherent  Descriptions of Associations:Intact  Orientation:Full (Time, Place and Person)  Thought Content:Logical  History of Schizophrenia/Schizoaffective disorder: Yes  Duration of Psychotic Symptoms:Greater than six months  Hallucinations:Hallucinations: Auditory Description of Auditory Hallucinations: static  Ideas of Reference:None  Suicidal Thoughts:Suicidal Thoughts: No  Homicidal Thoughts:Homicidal Thoughts: No   Sensorium  Memory:Immediate Good; Recent Good; Remote Good  Judgment:Poor  Insight:Shallow   Executive Functions  Concentration:Fair  Attention Span:Fair  Recall:Fair  Fund of Knowledge:Fair  Language:Fair   Psychomotor Activity  Psychomotor Activity:Psychomotor Activity: Normal   Assets  Assets:Communication Skills; Desire for Improvement   Sleep  Sleep:Sleep: Fair    Physical Exam: Vital signs:  Temp:  [97.7 F (36.5 C)-98.2 F (36.8 C)] 98.2 F (36.8 C) (08/05 0920) Pulse Rate:  [101-102] 101 (08/05 0920) Resp:  [15-18] 15 (08/05 0920) BP: (110-125)/(64-78) 110/67 (08/05 0920) SpO2:  [98 %-100 %] 100 % (08/05 0920) Physical Exam Vitals and nursing note reviewed.  Constitutional:      General: She is not in acute distress.    Appearance: She is not ill-appearing.  Pulmonary:     Effort: Pulmonary effort is normal. No respiratory distress.  Skin:    General: Skin is warm and dry.  Neurological:     General: No focal deficit present.     Mental Status: She is alert.  Psychiatric:        Attention and Perception: Attention normal. She does not perceive visual hallucinations.        Speech: Speech normal.        Behavior: Behavior is not aggressive or combative. Behavior is cooperative.        Thought Content:  Thought content is not paranoid. Thought content does not include homicidal or suicidal ideation. Thought content does not include suicidal plan.        Judgment: Judgment is impulsive.     Blood pressure 110/67, pulse (!) 101, temperature 98.2 F (36.8 C), temperature source Oral, resp. rate 15, height 5\' 5"  (1.651 m), weight 98.2 kg, last menstrual period 08/08/2022, SpO2 100%. Body mass index is 36.03 kg/m.   Other History   These have been pulled in through the EMR, reviewed, and updated if appropriate.   Family History:  The patient's family history includes Asthma in her father; Mental illness in her father; PDD in her brother; Seizures in her brother.  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    Chronic constipation    Depression    Drug-seeking behavior    Essential tremor    Headache    Ineffective individual coping 05/16/2022   Insomnia 01/12/2022   Intentional drug overdose (HCC) 06/05/2022   Neuromuscular disorder (HCC)    Normocytic anemia 06/05/2022   Overdose 07/22/2017   Overdose of acetaminophen 07/2017   and other meds   Overdose, intentional self-harm, initial encounter (  HCC) 07/20/2021   Paranoia (HCC) 04/22/2021   Personality disorder (HCC)    Purposeful non-suicidal drug ingestion (HCC) 06/27/2021   Schizo-affective psychosis (HCC)    Schizoaffective disorder (HCC) 07/29/2022   Schizoaffective disorder, bipolar type (HCC)    Seizures (HCC)    Last seizure December 2017   Skin erythema 04/27/2022   Sleep apnea    Suicidal behavior 07/25/2021   Suicidal ideation    Suicide (HCC) 07/01/2021   Suicide attempt (HCC)  07/04/2021    Surgical History: Past Surgical History:  Procedure Laterality Date   MOUTH SURGERY  2009 or 2010    Medications:   Current Facility-Administered Medications:    0.9 %  sodium chloride infusion, 250 mL, Intravenous, Continuous, Gonfa, Taye T, MD, Stopped at 08/24/22 1347   albuterol (PROVENTIL) (2.5 MG/3ML) 0.083% nebulizer solution 3 mL, 3 mL, Inhalation, Q6H PRN, Alanda Slim, Taye T, MD   cyclobenzaprine (FLEXERIL) tablet 10 mg, 10 mg, Oral, QHS, Gonfa, Taye T, MD, 10 mg at 09/09/22 2106   diclofenac Sodium (VOLTAREN) 1 % topical gel 2 g, 2 g, Topical, QID, Gonfa, Taye T, MD, 2 g at 09/09/22 2107   DULoxetine (CYMBALTA) DR capsule 60 mg, 60 mg, Oral, Daily, Akintayo, Mojeed, MD, 60 mg at 09/09/22 0834   ferrous sulfate tablet 325 mg, 325 mg, Oral, BID WC, Gonfa, Taye T, MD, 325 mg at 09/09/22 1739   gabapentin (NEURONTIN) capsule 600 mg, 600 mg, Oral, TID, Alanda Slim, Taye T, MD, 600 mg at 09/09/22 2105   hydrOXYzine (ATARAX) tablet 25 mg, 25 mg, Oral, TID PRN, Alanda Slim, Taye T, MD, 25 mg at 09/08/22 1930   ibuprofen (ADVIL) tablet 600 mg, 600 mg, Oral, TID PRN, Alanda Slim, Taye T, MD, 600 mg at 09/08/22 1416   LORazepam (ATIVAN) tablet 0.5 mg, 0.5 mg, Oral, BID PRN, Alanda Slim, Taye T, MD, 0.5 mg at 09/09/22 2106   mometasone-formoterol (DULERA) 200-5 MCG/ACT inhaler 2 puff, 2 puff, Inhalation, BID, Alanda Slim, Taye T, MD, 2 puff at 09/09/22 1955   mupirocin cream (BACTROBAN) 2 %, , Topical, Daily, Sheikh, Omair Latif, DO, Given at 09/09/22 1000   OLANZapine (ZYPREXA) injection 10 mg, 10 mg, Intramuscular, Once PRN, Akintayo, Mojeed, MD   OLANZapine (ZYPREXA) tablet 20 mg, 20 mg, Oral, Daily, Mariel Craft, MD, 20 mg at 09/09/22 2106   OLANZapine zydis (ZYPREXA) disintegrating tablet 5 mg, 5 mg, Oral, BID PRN, Mariel Craft, MD   ondansetron (ZOFRAN-ODT) disintegrating tablet 4 mg, 4 mg, Oral, Q8H PRN, Sheikh, Omair Latif, DO, 4 mg at 09/01/22 2025   Oral care mouth rinse, 15 mL, Mouth Rinse,  PRN, Osvaldo Shipper, MD   pantoprazole (PROTONIX) EC tablet 40 mg, 40 mg, Oral, QAC breakfast, Gonfa, Taye T, MD, 40 mg at 09/09/22 0833   polyethylene glycol (MIRALAX / GLYCOLAX) packet 17 g, 17 g, Oral, BID PRN, Alanda Slim, Taye T, MD, 17 g at 09/09/22 2106   prazosin (MINIPRESS) capsule 1 mg, 1 mg, Oral, QHS, Gonfa, Taye T, MD, 1 mg at 09/09/22 2107   senna-docusate (Senokot-S) tablet 1 tablet, 1 tablet, Oral, BID PRN, Candelaria Stagers T, MD, 1 tablet at 09/09/22 2106   sodium bicarbonate tablet 650 mg, 650 mg, Oral, TID, Candelaria Stagers T, MD, 650 mg at 09/09/22 2105  Allergies: Allergies  Allergen Reactions   Bee Venom Anaphylaxis   Coconut Flavor Anaphylaxis and Rash   Fish Allergy Anaphylaxis   Geodon [Ziprasidone Hcl] Other (See Comments)    Pt states that this medication causes paralysis  of the mouth.     Haloperidol And Related Other (See Comments)    Pt states that this medication causes paralysis of the mouth, jaw locks up   Lithobid [Lithium] Other (See Comments)    Seizure-like activity    Roxicodone [Oxycodone] Other (See Comments)    Hallucinations    Seroquel [Quetiapine] Other (See Comments)    Severe drowsiness - Pt currently taking 12.5 mg BID, she is ok with this dose   Shellfish Allergy Anaphylaxis   Phenergan [Promethazine Hcl] Other (See Comments)    Chest pain     Prilosec [Omeprazole] Nausea And Vomiting and Other (See Comments)    Pt can take protonix with no problems    Sulfa Antibiotics Other (See Comments)    Chest pain    Tegretol [Carbamazepine] Nausea And Vomiting   Prozac [Fluoxetine] Other (See Comments)    Increased Depression and Suicidal thoughts   Tape Other (See Comments)    Skin tears, can only tolerate paper tape.   Tylenol [Acetaminophen] Rash and Other (See Comments)    Rash on face

## 2022-09-10 NOTE — Progress Notes (Signed)
This nurse was notified by sitter that pt. Had something in her mouth.Pt stated " she was going to swallow something to kill her self. Nurse found top to can in pt. mouth. PT. Room striped and searched all items removed from pt room, call bell not within reach

## 2022-09-10 NOTE — Plan of Care (Signed)
Collateral information obtained with Rosalia Hammers [phone]).  Provider spoke with Fayrene Fearing who is patient's ex-boyfriend whom resides with the patient.  He discusses that prior to admission, the patient was managing her medications using a pillbox on her own. She was also accessing her medications once they were delivered to the mailbox/doorstep. We had a long discussion of changes that need to occur in which Fayrene Fearing agreed to keeping her medications in a temperature regulated, locked trunk that the patient does not have access to. He also agreed for the patient to be given her morning, noon and evening medication vs. the patient handling the medications herself from the pillbox. He agrees to more frequent checkups with the CST team and discussed how we are currently trying to work this out with them. He denies immediate safety concerns at this time for her returning home if patient does not make it off of the [facility] waitlist. Of note, he plans to visit his mother on 8/23 which concerns him as there is nobody else living in the home to watch the patient. We will discuss with the patient if there is any other family available to assist during that time.  PLAN: -Continue following up with CST for evaluation and more frequent visits -Patient currently on [facility] waitlist -Continue current psychiatric medications  Kizzie Ide, MD PGY-2 Psychiatry

## 2022-09-10 NOTE — Progress Notes (Signed)
Nurse tech reports pt is talking to her self and hallucinating.

## 2022-09-10 NOTE — Progress Notes (Addendum)
Pt. Notified by sitter pt. Is laying on floor. This nurse instructed pt. To move back to bed. Which she did with no problem.

## 2022-09-10 NOTE — Progress Notes (Signed)
[facility] PROGRESS NOTE  [name] (DOB: 1990/10/19) XBM:[id number] PCP: [name]  Brief Narrative: 32 y.o. F with PMH of adjustment disorder with mixed anxiety and depressed mood, autism, bipolar 1 disorder, fragile x syndrome, seizures not on meds, schizoaffective disorder and multiple suicide attempts in the last year who presented to the ED after ingesting numerous medications in a suicide attempt. She reported taking #30 metoprolol 25mg  tablets, #60 Hydroxyzine 25mg , #7 Lexapro 20mg , #21 Gabapentin 600mg , #7 Flexeril 10mg , #7 protonix 40mg  and #7 Prazosin 1mg . She was slightly bradycardic and hypotensive on EMS arrival which improved with fluids. Poison control was contacted and as she was within 2hrs of ingestion was given activated charcoal. Placed under IVC in ED. Poison control also recommended ICU admission for close Qtc and electrolyte monitoring and possible pressors. At the time of admission, pt was somnolent but arousable to voice and following commands. She was intubated and started on vasopressor and glucagon. Also started on ceftriaxone for possible pneumonia. Patient was extubated on 7/18 and transferred to the floor on 7/19. She was transferred to [facility] service on 08/25/22. Psychiatry continues to follow. They recommend inpatient psychiatric hospitalization.   Subjective: No complaints, seen laying in bed having completed word search.   Objective: BP 110/67 (BP Location: Right Arm)   Pulse (!) 101   Temp 98.2 F (36.8 C) (Oral)   Resp 15   Ht 5\' 5"  (1.651 m)   Wt 98.2 kg   LMP 08/08/2022 (Approximate)   SpO2 100%   BMI 36.03 kg/m   Gen: No distress  Assessment & Plan: Suicidal Attempt with intentional overdose:  Overdosed with multiple medications including #30 metoprolol 25mg  tablets, #60 Hydroxyzine 25mg , #7 Lexapro 20mg , #21 Gabapentin 600mg , #7 Flexeril 10mg , #7 protonix 40mg  and #7 Prazosin 1mg .  Poison control consulted. Received  charcoal. Denies ETOH or illicit drug use.  Extended UDS negative. Required vasopressor, glucagon, intubation and mechanical ventilation 7/17-7/18.  Now stable. Currently she is involuntarily committed. Psychiatry is following closely. She continues to mention suicidal ideation.   Anxiety and Depression, Schizoaffective Disorder, Adjustment Disorder, bipolar disorder, fibromyalgia and auditory hallucination:  Patient noted to be on Cogentin, Cymbalta, Neurontin, Zyprexa, prazosin. Psychiatry follows closely. She was on Lexapro prior to admission which has been changed over to Cymbalta. -Patient started hearing voices in her head that were "too much for her to handle" and then she threatened "that she was in a kill and strangle one of the staff members" -Intermittently has to be placed in 4-point restraints and given IM Zyprexa.  She still has a one-to-one sitter at bedside and will continue -Cymbalta has been increased from 30 mg to 60 mg daily and will be continuing olanzapine 10 mg p.o. daily nightly for psychosis and continuing gabapentin 600 mg 3 times daily for agitated mood/anxiety - Continues to have periodic episodes of agitation.  Await placement to inpatient psychiatric facility.   CAP Completed course of antibiotics.  Respiratory status is stable.   Essential hypertension  Normotensive. Was apparently on metoprolol prior to admission which is currently on hold. Labs were ordered but patient continues to refuse.   Sinus Tachycardia -Resolved.   Metabolic Acidosis -Improved.  Noted to be on sodium bicarbonate.   Hypomagnesemia Monitor periodically.   Microcytic Anemia Continue ferrous sulfate.  Blood work was ordered but patient refuses.  Hemoglobin was stable previously.     History of Seizure Disorder -Stable.  Not on meds.   History of Asthma -Stable.  GERD/GI Prophylaxis -Continue PPI with Pantoprazole and Famotidine 20 mg po BID   Obesity -Complicates  overall prognosis and care -Estimated body mass index is 36.03 kg/m as calculated from the following:   Height as of this encounter: 5\' 5"  (1.651 m).   Weight as of this encounter: 98.2 kg.  -Weight Loss and Dietary Counseling given  Tyrone Nine, MD [facility] [email or web address] 09/10/2022, 1:51 PM

## 2022-09-11 DIAGNOSIS — F315 Bipolar disorder, current episode depressed, severe, with psychotic features: Secondary | ICD-10-CM | POA: Diagnosis not present

## 2022-09-11 NOTE — Progress Notes (Signed)
[facility] PROGRESS NOTE  [name] (DOB: 1990-02-20) XBM:[id number] PCP: [name]  Brief Narrative: 32 y.o. F with PMH of adjustment disorder with mixed anxiety and depressed mood, autism, bipolar 1 disorder, fragile x syndrome, seizures not on meds, schizoaffective disorder and multiple suicide attempts in the last year who presented to the ED after ingesting numerous medications in a suicide attempt. She reported taking #30 metoprolol 25mg  tablets, #60 Hydroxyzine 25mg , #7 Lexapro 20mg , #21 Gabapentin 600mg , #7 Flexeril 10mg , #7 protonix 40mg  and #7 Prazosin 1mg . She was slightly bradycardic and hypotensive on EMS arrival which improved with fluids. Poison control was contacted and as she was within 2hrs of ingestion was given activated charcoal. Placed under IVC in ED. Poison control also recommended ICU admission for close Qtc and electrolyte monitoring and possible pressors. At the time of admission, pt was somnolent but arousable to voice and following commands. She was intubated and started on vasopressor and glucagon. Also started on ceftriaxone for possible pneumonia. Patient was extubated on 7/18 and transferred to the floor on 7/19. She was transferred to [facility] service on 08/25/22. Psychiatry continues to follow. They recommend inpatient psychiatric hospitalization.   Subjective: No complaints, speaking with sitter in room amicably. Events overnight noted. All of room is cleared out now.  Objective: BP 110/67 (BP Location: Right Arm)   Pulse (!) 101   Temp 98.2 F (36.8 C) (Oral)   Resp 15   Ht 5\' 5"  (1.651 m)   Wt 98.2 kg   LMP 08/08/2022 (Approximate)   SpO2 100%   BMI 36.03 kg/m   No distress, clear, nonlabored with RRR.  Assessment & Plan: Suicidal Attempt with intentional overdose:  Overdosed with multiple medications including #30 metoprolol 25mg  tablets, #60 Hydroxyzine 25mg , #7 Lexapro 20mg , #21 Gabapentin 600mg , #7 Flexeril 10mg , #7  protonix 40mg  and #7 Prazosin 1mg .  Poison control consulted. Received charcoal. Denies ETOH or illicit drug use.  Extended UDS negative. Required vasopressor, glucagon, intubation and mechanical ventilation 7/17-7/18.  Now stable. Currently she is involuntarily committed. Psychiatry is following closely. She continues to mention suicidal ideation.   Anxiety and Depression, Schizoaffective Disorder, Adjustment Disorder, bipolar disorder, fibromyalgia and auditory hallucination:  Patient noted to be on Cogentin, Cymbalta, Neurontin, Zyprexa, prazosin. Psychiatry follows closely. She was on Lexapro prior to admission which has been changed over to Cymbalta. -Patient started hearing voices in her head that were "too much for her to handle" and then she threatened "that she was in a kill and strangle one of the staff members" -Intermittently has to be placed in 4-point restraints and given IM Zyprexa.  She still has a one-to-one sitter at bedside and will continue -Cymbalta has been increased from 30 mg to 60 mg daily and will be continuing olanzapine 10 mg p.o. daily nightly for psychosis and continuing gabapentin 600 mg 3 times daily for agitated mood/anxiety - Continues to have periodic episodes of agitation.  Would be in favor of prn ODT zyprexa during these episodes (last was 8/5). Await placement to inpatient psychiatric facility.   CAP Completed course of antibiotics.  Respiratory status is stable.   Essential hypertension  Normotensive. Was apparently on metoprolol prior to admission which is currently on hold. Labs were ordered but patient continues to refuse.   Sinus Tachycardia -Resolved.   Metabolic Acidosis -Improved.  Noted to be on sodium bicarbonate.   Hypomagnesemia Monitor periodically.   Microcytic Anemia Continue ferrous sulfate.  Blood work was ordered but patient refuses.  Hemoglobin was stable previously.     History of Seizure Disorder -Stable.  Not on meds.    History of Asthma -Stable.   GERD/GI Prophylaxis -Continue PPI with Pantoprazole and Famotidine 20 mg po BID   Obesity -Complicates overall prognosis and care -Estimated body mass index is 36.03 kg/m as calculated from the following:   Height as of this encounter: 5\' 5"  (1.651 m).   Weight as of this encounter: 98.2 kg.  -Weight Loss and Dietary Counseling given  Tyrone Nine, MD [facility] [email or web address] 09/11/2022, 9:14 AM

## 2022-09-11 NOTE — Care Management Important Message (Signed)
Important Message  Patient Details  Name: [name] MRN: [id number] Date of Birth: 05-May-1990   Medicare Important Message Given:  Yes     Christie Viscomi Stefan Church 09/11/2022, 12:58 PM

## 2022-09-11 NOTE — Consult Note (Signed)
[name] Psychiatry Consult Evaluation  Service Date: September 11, 2022 LOS:  LOS: 20 days    Primary Psychiatric Diagnoses  Schizoaffective disorder, bipolar type  GAD  Assessment  [name] is a 32 y.o. female with a past psychiatric history of borderline personality disorder, schizoaffective disorder bipolar type, and PTSD.  She has had numerous behavioral health admissions and an extensive history of suicide attempts.  Patient presents to [facility] ED for an attempted overdose on her prescription medications.  During this hospitalization, patient has required restraints due to increased agitation, disruptive behavior, and increased risk for harming herself. Patient reports command hallucinations, telling her to harm herself and others. She has had multiple suicide attempts this past year and has multiple risk factors, including severe mood instability, impulsivity, and significant interpersonal difficulties, all of which contribute to an increased risk of suicide. Although adequate safety measures remain in place, it is important to acknowledge the behavioral component to patient's disruptive behavior on the unit, consistent with her diagnosis of BPD. There are inconsistencies in patient's report when seen by different members of the team.   We are simplifying patient's medication regimen to avoid polypharmacy especially in the setting of her recent overdose. We also discussed medication management when she does return home including closer monitoring, less access to her medications through a locked cabinet, and her boyfriend being more in charge of her medications. Patient has an extensive history of ER visits and hospitalizations in which she has displayed aggression, psychosis, and danger to self while in proximity of a Recruitment consultant. Patient also shows high level of lethality, intensity, and frequency of suicidal attempts with multiple inpatient psychiatric admissions with little  benefit so we continue to recommend [facility]. Patient is currently on the [facility] waitlist and we are working closely with LSW with bed availability.   8/6: Patient continues to deny psychiatric symptoms of SI, HI, and AVH. Regarding the concerns of self harm and suicidal ideation yesterday evening, she expresses that she was not genuinely feeling suicide and notes this is due to feeling bored. I discussed the seriousness of the concerning behaviors and will restrict privileges of leisure if she continues these behaviors.  I spoke with patient's ex-boyfriend whom she lives with regarding closer medication monitoring in which we discussed a plan moving forward in which patient will have minimal access to her medications. We will contact the CST team regarding the option of seeing the patient more often if patient were to return home.  Diagnoses:  Active Hospital problems: Principal Problem:   Bipolar I disorder, current or most recent episode depressed, with psychotic features (HCC) Active Problems:   Asthma   GAD (generalized anxiety disorder)   Suicide attempt (HCC)   Overdose   Drug overdose    Plan   ## Psychiatric Medication Recommendations:  -- Continue Zyprexa 20mg  po nightly for psychosis -- Continue Cymbalta 60 mg daily  -- Continue Gabapentin 600 mg TID for agitated mood/anxiety -- PRN Zyprexa 10mg  IM for agitation -- PRN Ativan 0.5 mg oral BID PRN for agitation -- PRN Zyprexa zydis 5 mg BID for agitation  ## Medical Decision Making Capacity:   Capacity was not formally addressed during this encounter  ## Further Work-up:  -- Per primary -- most recent EKG on 08/30/2022 had QtC of 469  ## Disposition:  -- We recommend inpatient psychiatric hospitalization after medical hospitalization. Patient has been involuntarily committed on 09/04/2022.  -Attempted to contact friend [name] whom she lives with  for safety planning, but he did not answer the call.  Will attempt to call at a later  time.  ## Behavioral / Environmental:  --  To minimize splitting of staff, assign one staff person to communicate all information from the team when feasible. -- Apply the established behavioral rules and protocols consistently throughout the night (see nursing care order for details). Ensure that all staff members are aware of and adhere to the same guidelines to avoid confusion and mixed messages.   ## Safety and Observation Level:  - Based on my clinical evaluation, I estimate the patient to be at elevated risk of self harm in the current setting - At this time, we recommend continuing a 1: 1 level of observation. This decision is based on my review of the chart including patient's history and current presentation, interview of the patient, mental status examination, and consideration of suicide risk including evaluating suicidal ideation, plan, intent, suicidal or self-harm behaviors, risk factors, and protective factors. This judgment is based on our ability to directly address suicide risk, implement suicide prevention strategies and develop a safety plan while the patient is in the clinical setting. Please contact our team if there is a concern that risk level has changed.  Suicide risk assessment  Patient has following modifiable risk factors for suicide: under treated depression  and recklessness, which we are addressing by continuing adequate safety precautions while she is hospitalized and adjusting medications   Patient has following non-modifiable or demographic risk factors for suicide: history of suicide attempt, history of self harm behavior, and psychiatric hospitalization  Patient has the following protective factors against suicide: Access to outpatient mental health care  Thank you for this consult request. Recommendations have been communicated to the primary team.  We will follow at this time.   Kizzie Ide, MD PGY-2 Psychiatry   Psychiatric and Social History    Relevant Aspects of Hospital Course:  Admitted on 08/22/2022 for suicide attempt by overdose on her prescription medication.   Patient Report:  Per nursing, from 4:30-6:30 PM, the following notes were documented: Pt stated she wanted to kill her self and plan to do so with her blanket. This limited pt. to one blanket. pt. Had something in her mouth.Pt stated " she was going to swallow something to kill her self. Nurse found top to can in pt. mouth. PT. Room striped and searched all items removed from pt room, call bell not within reach. pt. Is laying on floor. This nurse instructed pt. To move back to bed. Which she did with no problem. Pt. Made scratches on left forearm using nails.    Patient seen this AM. She reports feeling "okay" and slept well overnight.  She notes having fair appetite and eating some of her meals.  I discussed the events of yesterday evening that the nurses documented.  She stated "I was playing around pretending to be suicidal. I was just goofing around and had no harm intended".  When I asked her why she did the behaviors, she states "I am just bored and tired of being in the hospital and want to leave".  I discussed the severity of these behaviors and how we will take away privileges of walks and visits if she continues making statements of suicide or doing self-harm behavior such as scratching herself.  She denies adverse effects from her medications and feels that the Zyprexa is no longer making her tired.  Denies SI, HI, and AVH.  I discussed speaking  with her ex-boyfriend [name] who lives with her about medication monitoring.  She agrees with the plan of having no access to her medications other than being handed her medications from another individual.  She agrees that CST can visit her multiple times in the week to help with medication management.  We discussed how her behaviors of making suicidal ideation and self-harm statements when she is bored can hinder her life goals of  her future career and eventually seeing her daughter.  Attempted to call CST Genie but no answer, left a vm.  Psychiatric ROS Generally reported less sx today than earlier in hospital stay, trying to attain goal of discharge.   Past Psychiatric History: Previous Psych Diagnoses: Schizoaffective disorder bipolar type, Anxiety, Autism spectrum disorder, PTSD, Borderline personality disorder Prior inpatient treatment: multiple, 15 ED visits and 4 inpatient admissions since 06/2022, admitted to [facility] from 07/29/22-08/02/2022, has been to [facility] for eight months in 2016, and was discharged from inpatient facility past week Current/prior outpatient treatment: [facility], CST team twice a week Prior rehab hx: denies multiple Psychotherapy hx: [facility] History of suicide: 20-30 times by overdosing on pills, cutting or drinking 409 disinfectant History of homicide or aggression: denies Psychiatric medication history:  Previously trialed Tegretol, Haldol, Clozaril, Depakote, Invega, Lamictal, Seroquel, asenapine, abilify, vraylar (worsened suicidal thoughts), Lexapro, Zoloft  Neuromodulation history: denies  Social History:  Marital status: Widowed Children: 76 year old daughter Lives: with ex-boyfriend [name], in two story home Employment: Disability, 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  Access to weapons: None   Exam Findings   Psychiatric Specialty Exam:  Presentation  General Appearance: Appropriate for Environment; Casual  Eye Contact:Good  Speech:Clear and Coherent  Speech Volume:Normal  Handedness:Right   Mood and Affect  Mood:-- depressed  Affect:-- congruent, restricted   Thought Process  Thought Processes:Linear; Goal Directed; Coherent  Descriptions of Associations:Intact  Orientation:Full (Time, Place and Person)  Thought Content:Logical  History of Schizophrenia/Schizoaffective disorder:  Yes  Duration of Psychotic Symptoms:Greater than six months  Hallucinations: None  Ideas of Reference:None  Suicidal Thoughts: Denies  Homicidal Thoughts: Denies   Sensorium  Memory:Immediate Good; Recent Good; Remote Good  Judgment:Poor  Insight: Fair   Art therapist  Concentration:Fair  Attention Span:Fair  Recall:Fair  [employer] of Knowledge:Fair  Language:Fair   Psychomotor Activity  Psychomotor Activity: None   Assets  Assets:Communication Skills; Desire for Improvement   Sleep  Sleep: Fair    Physical Exam: Vital signs:  Temp:  [98.2 F (36.8 C)] 98.2 F (36.8 C) (08/05 0920) Pulse Rate:  [101] 101 (08/05 0920) Resp:  [15] 15 (08/05 0920) BP: (110)/(67) 110/67 (08/05 0920) SpO2:  [100 %] 100 % (08/05 2014) Physical Exam Vitals and nursing note reviewed.  Constitutional:      General: She is not in acute distress.    Appearance: She is not ill-appearing.  Pulmonary:     Effort: Pulmonary effort is normal. No respiratory distress.  Skin:    General: Skin is warm and dry.  Neurological:     General: No focal deficit present.     Mental Status: She is alert.  Psychiatric:        Attention and Perception: Attention normal. She does not perceive visual hallucinations.        Speech: Speech normal.        Behavior: Behavior is not aggressive or combative. Behavior is cooperative.  Thought Content: Thought content is not paranoid. Thought content does not include homicidal or suicidal ideation. Thought content does not include suicidal plan.        Judgment: Judgment is impulsive.     Blood pressure 110/67, pulse (!) 101, temperature 98.2 F (36.8 C), temperature source Oral, resp. rate 15, height 5\' 5"  (1.651 m), weight 98.2 kg, last menstrual period 08/08/2022, SpO2 100%. Body mass index is 36.03 kg/m.   Other History   These have been pulled in through the EMR, reviewed, and updated if appropriate.   Family History:  The  patient's family history includes Asthma in her father; Mental illness in her father; PDD in her brother; Seizures in her brother.  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    Chronic constipation    Depression    Drug-seeking behavior    Essential tremor    Headache    Ineffective individual coping 05/16/2022   Insomnia 01/12/2022   Intentional drug overdose (HCC) 06/05/2022   Neuromuscular disorder (HCC)    Normocytic anemia 06/05/2022   Overdose 07/22/2017   Overdose of acetaminophen 07/2017   and other meds   Overdose, intentional self-harm, initial encounter (HCC) 07/20/2021   Paranoia (HCC) 04/22/2021   Personality disorder (HCC)    Purposeful non-suicidal drug ingestion (HCC) 06/27/2021   Schizo-affective psychosis (HCC)    Schizoaffective disorder (HCC) 07/29/2022   Schizoaffective disorder, bipolar type (HCC)    Seizures (HCC)    Last seizure December 2017   Skin erythema 04/27/2022   Sleep apnea    Suicidal behavior 07/25/2021   Suicidal ideation    Suicide (HCC) 07/01/2021   Suicide attempt (HCC) 07/04/2021    Surgical History: Past Surgical History:  Procedure Laterality Date   MOUTH SURGERY  2009 or 2010    Medications:   Current Facility-Administered Medications:    0.9 %  sodium chloride infusion, 250 mL, Intravenous, Continuous, Gonfa, Taye T, MD, Stopped at 08/24/22 1347   albuterol (PROVENTIL) (2.5 MG/3ML) 0.083% nebulizer solution 3 mL, 3 mL, Inhalation, Q6H PRN, Alanda Slim, Taye T, MD   cyclobenzaprine (FLEXERIL) tablet 10 mg, 10 mg, Oral, QHS, Gonfa, Taye T, MD, 10 mg at 09/10/22 2103   diclofenac Sodium (VOLTAREN) 1 %  topical gel 2 g, 2 g, Topical, QID, Gonfa, Taye T, MD, 2 g at 09/10/22 2107   DULoxetine (CYMBALTA) DR capsule 60 mg, 60 mg, Oral, Daily, Akintayo, Mojeed, MD, 60 mg at 09/10/22 1015   ferrous sulfate tablet 325 mg, 325 mg, Oral, BID WC, Gonfa, Taye T, MD, 325 mg at 09/10/22 1619   gabapentin (NEURONTIN) capsule 600 mg, 600 mg, Oral, TID, Alanda Slim, Taye T, MD, 600 mg at 09/10/22 2104   hydrOXYzine (ATARAX) tablet 25 mg, 25 mg, Oral, TID PRN, Alanda Slim, Taye T, MD, 25 mg at 09/10/22 2103   ibuprofen (ADVIL) tablet 600 mg, 600 mg, Oral, TID PRN, Alanda Slim, Taye T, MD, 600 mg at 09/10/22 1835   LORazepam (ATIVAN) tablet 0.5 mg, 0.5 mg, Oral, BID PRN, Alanda Slim, Taye T, MD, 0.5 mg at 09/10/22 1619   mometasone-formoterol (DULERA) 200-5 MCG/ACT inhaler 2 puff, 2  puff, Inhalation, BID, Candelaria Stagers T, MD, 2 puff at 09/10/22 2014   mupirocin cream (BACTROBAN) 2 %, , Topical, Daily, Marguerita Merles Roberts, [place], Given at 09/10/22 1021   OLANZapine (ZYPREXA) injection 10 mg, 10 mg, Intramuscular, Once PRN, Akintayo, Mojeed, MD   OLANZapine (ZYPREXA) tablet 20 mg, 20 mg, Oral, Daily, Mariel Craft, MD, 20 mg at 09/10/22 2102   OLANZapine zydis (ZYPREXA) disintegrating tablet 5 mg, 5 mg, Oral, BID PRN, Mariel Craft, MD   ondansetron (ZOFRAN-ODT) disintegrating tablet 4 mg, 4 mg, Oral, Q8H PRN, Marguerita Merles Latif, DO, 4 mg at 09/01/22 2025   Oral care mouth rinse, 15 mL, Mouth Rinse, PRN, Osvaldo Shipper, MD   pantoprazole (PROTONIX) EC tablet 40 mg, 40 mg, Oral, QAC breakfast, Gonfa, Taye T, MD, 40 mg at 09/10/22 1016   polyethylene glycol (MIRALAX / GLYCOLAX) packet 17 g, 17 g, Oral, BID PRN, Alanda Slim, Taye T, MD, 17 g at 09/10/22 1016   prazosin (MINIPRESS) capsule 1 mg, 1 mg, Oral, QHS, Gonfa, Taye T, MD, 1 mg at 09/10/22 2137   senna-docusate (Senokot-S) tablet 1 tablet, 1 tablet, Oral, BID PRN, Candelaria Stagers T, MD, 1 tablet at 09/10/22 1016   sodium bicarbonate tablet 650 mg, 650 mg, Oral, TID, Candelaria Stagers T, MD, 650 mg  at 09/10/22 2137  Allergies: Allergies  Allergen Reactions   Bee Venom Anaphylaxis   Coconut Flavor Anaphylaxis and Rash   Fish Allergy Anaphylaxis   Geodon [Ziprasidone Hcl] Other (See Comments)    Pt states that this medication causes paralysis of the mouth.     Haloperidol And Related Other (See Comments)    Pt states that this medication causes paralysis of the mouth, jaw locks up   Lithobid [Lithium] Other (See Comments)    Seizure-like activity    Roxicodone [Oxycodone] Other (See Comments)    Hallucinations    Seroquel [Quetiapine] Other (See Comments)    Severe drowsiness - Pt currently taking 12.5 mg BID, she is ok with this dose   Shellfish Allergy Anaphylaxis   Phenergan [Promethazine Hcl] Other (See Comments)    Chest pain     Prilosec [Omeprazole] Nausea And Vomiting and Other (See Comments)    Pt can take protonix with no problems    Sulfa Antibiotics Other (See Comments)    Chest pain    Tegretol [Carbamazepine] Nausea And Vomiting   Prozac [Fluoxetine] Other (See Comments)    Increased Depression and Suicidal thoughts   Tape Other (See Comments)    Skin tears, can only tolerate paper tape.   Tylenol [Acetaminophen] Rash and Other (See Comments)    Rash on face

## 2022-09-11 NOTE — Plan of Care (Signed)
  Problem: Activity: Goal: Risk for activity intolerance will decrease Outcome: Progressing   Problem: Coping: Goal: Level of anxiety will decrease Outcome: Progressing   Problem: Safety: Goal: Ability to remain free from injury will improve Outcome: Progressing   

## 2022-09-11 NOTE — Plan of Care (Signed)
  Problem: Education: Goal: Knowledge of General Education information will improve Description: Including pain rating scale, medication(s)/side effects and non-pharmacologic comfort measures Outcome: Not Progressing   Problem: Health Behavior/Discharge Planning: Goal: Ability to manage health-related needs will improve Outcome: Not Progressing   Problem: Clinical Measurements: Goal: Ability to maintain clinical measurements within normal limits will improve Outcome: Not Progressing Goal: Will remain free from infection Outcome: Not Progressing Goal: Diagnostic test results will improve Outcome: Not Progressing Goal: Cardiovascular complication will be avoided Outcome: Not Progressing   Problem: Activity: Goal: Risk for activity intolerance will decrease Outcome: Not Progressing   Problem: Coping: Goal: Level of anxiety will decrease Outcome: Not Progressing   Problem: Elimination: Goal: Will not experience complications related to bowel motility Outcome: Not Progressing   Problem: Pain Managment: Goal: General experience of comfort will improve Outcome: Not Progressing   Problem: Safety: Goal: Ability to remain free from injury will improve Outcome: Not Progressing   Problem: Skin Integrity: Goal: Risk for impaired skin integrity will decrease Outcome: Not Progressing   Problem: Role Relationship: Goal: Method of communication will improve Outcome: Not Progressing   Problem: Coping: Goal: Ability to adjust to condition or change in health will improve Outcome: Not Progressing   Problem: Fluid Volume: Goal: Ability to maintain a balanced intake and output will improve Outcome: Not Progressing   Problem: Health Behavior/Discharge Planning: Goal: Ability to identify and utilize available resources and services will improve Outcome: Not Progressing Goal: Ability to manage health-related needs will improve Outcome: Not Progressing   Problem: Metabolic: Goal:  Ability to maintain appropriate glucose levels will improve Outcome: Not Progressing   Problem: Nutritional: Goal: Maintenance of adequate nutrition will improve Outcome: Not Progressing Goal: Progress toward achieving an optimal weight will improve Outcome: Not Progressing   Problem: Skin Integrity: Goal: Risk for impaired skin integrity will decrease Outcome: Not Progressing   Problem: Tissue Perfusion: Goal: Adequacy of tissue perfusion will improve Outcome: Not Progressing

## 2022-09-11 NOTE — TOC Progression Note (Signed)
Transition of Care [facility]) - Progression Note    Patient Details  Name: [name] MRN: [id number] Date of Birth: 22-Sep-1990  Transition of Care [facility]) CM/SW Contact  Lorri Frederick, LCSW Phone Number: 09/11/2022, 4:05 PM  Clinical Narrative:   CSW spoke to admissions at [facility].  Pt remains on priority wait list.  No available beds.         Expected Discharge Plan and Services                                               Social Determinants of Health (SDOH) Interventions SDOH Screenings   Food Insecurity: No Food Insecurity (07/29/2022)  Housing: Low Risk  (07/29/2022)  Transportation Needs: No Transportation Needs (07/29/2022)  Recent Concern: Transportation Needs - Unmet Transportation Needs (05/06/2022)  Utilities: Not At Risk (07/29/2022)  Alcohol Screen: Low Risk  (07/29/2022)  Depression (PHQ2-9): Medium Risk (03/10/2022)  Tobacco Use: Medium Risk (09/07/2022)    Readmission Risk Interventions    06/06/2022    3:38 PM  Readmission Risk Prevention Plan  Transportation Screening Complete  Medication Review (RN Care Manager) Complete  PCP or Specialist appointment within 3-5 days of discharge Complete  HRI or Home Care Consult Complete  SW Recovery Care/Counseling Consult Complete  Palliative Care Screening Not Applicable  Skilled Nursing Facility Not Applicable

## 2022-09-12 DIAGNOSIS — F315 Bipolar disorder, current episode depressed, severe, with psychotic features: Secondary | ICD-10-CM | POA: Diagnosis not present

## 2022-09-12 NOTE — Progress Notes (Signed)
[facility] PROGRESS NOTE  [name] (DOB: [birth year]) ZOX:[id number] PCP: [name]  Brief Narrative: 32 y.o. F with PMH of adjustment disorder with mixed anxiety and depressed mood, autism, bipolar 1 disorder, fragile x syndrome, seizures not on meds, schizoaffective disorder and multiple suicide attempts in the last year who presented to the ED after ingesting numerous medications in a suicide attempt. She reported taking #30 metoprolol 25mg  tablets, #60 Hydroxyzine 25mg , #7 Lexapro 20mg , #21 Gabapentin 600mg , #7 Flexeril 10mg , #7 protonix 40mg  and #7 Prazosin 1mg . She was slightly bradycardic and hypotensive on EMS arrival which improved with fluids. Poison control was contacted and as she was within 2hrs of ingestion was given activated charcoal. Placed under IVC in ED. Poison control also recommended ICU admission for close Qtc and electrolyte monitoring and possible pressors. At the time of admission, pt was somnolent but arousable to voice and following commands. She was intubated and started on vasopressor and glucagon. Also started on ceftriaxone for possible pneumonia. Patient was extubated on 7/18 and transferred to the floor on 7/19. She was transferred to [facility] service on 08/25/22. Psychiatry continues to follow. They recommend inpatient psychiatric hospitalization.   Subjective: Sleeping, did not want to be disturbed, shook her head "no" when I asked if I can help her with anything.  Objective: BP 125/68 (BP Location: Left Arm)   Pulse 88   Temp 97.7 F (36.5 C) (Oral)   Resp 16   Ht 5\' 5"  (1.651 m)   Wt 98.2 kg   LMP 08/08/2022 (Approximate)   SpO2 98%   BMI 36.03 kg/m   No distress, nonlabored respirations  Assessment & Plan: Suicidal Attempt with intentional overdose:  Overdosed with multiple medications including #30 metoprolol 25mg  tablets, #60 Hydroxyzine 25mg , #7 Lexapro 20mg , #21 Gabapentin 600mg , #7 Flexeril 10mg , #7 protonix 40mg  and #7  Prazosin 1mg .  Poison control consulted. Received charcoal. Denies ETOH or illicit drug use.  Extended UDS negative. Required vasopressor, glucagon, intubation and mechanical ventilation 7/17-7/18.  Now stable. Currently she is involuntarily committed. Psychiatry is following closely. She continues to mention suicidal ideation. - Discussing w/psychiatry Re: renewing IVC.   Anxiety and Depression, Schizoaffective Disorder, Adjustment Disorder, bipolar disorder, fibromyalgia and auditory hallucination: Patient noted to be on Cogentin, Cymbalta, Neurontin, Zyprexa, prazosin. Psychiatry follows closely. She was on Lexapro prior to admission which has been changed over to Cymbalta. -Patient started hearing voices in her head that were "too much for her to handle" and then she threatened "that she was in a kill and strangle one of the staff members" -Intermittently has to be placed in 4-point restraints and given IM Zyprexa.  She still has a one-to-one sitter at bedside and will continue -Cymbalta has been increased from 30 mg to 60 mg daily and will be continuing olanzapine 10 mg p.o. daily nightly for psychosis and continuing gabapentin 600 mg 3 times daily for agitated mood/anxiety - Continues to have periodic episodes of agitation.  Would be in favor of prn ODT zyprexa during these episodes (last was 8/5). Await placement to inpatient psychiatric facility.   CAP Completed course of antibiotics.  Respiratory status is stable.   Essential hypertension  Normotensive. Was apparently on metoprolol prior to admission which is currently on hold. Labs were ordered but patient continues to refuse.   Sinus Tachycardia -Resolved.   Metabolic Acidosis -Improved.  Noted to be on sodium bicarbonate.   Hypomagnesemia Monitor periodically.   Microcytic Anemia Continue ferrous sulfate.  Blood work was  ordered but patient refuses.  Hemoglobin was stable previously.     History of Seizure  Disorder -Stable.  Not on meds.   History of Asthma -Stable.   GERD/GI Prophylaxis -Continue PPI with Pantoprazole and Famotidine 20 mg po BID   Obesity -Complicates overall prognosis and care -Estimated body mass index is 36.03 kg/m as calculated from the following:   Height as of this encounter: 5\' 5"  (1.651 m).   Weight as of this encounter: 98.2 kg.  -Weight Loss and Dietary Counseling given  Tyrone Nine, MD [facility] [email or web address] 09/12/2022, 9:03 AM

## 2022-09-12 NOTE — Consult Note (Signed)
[name] Psychiatry Consult Evaluation  Service Date: September 12, 2022 LOS:  LOS: 21 days    Primary Psychiatric Diagnoses  Schizoaffective disorder, bipolar type  GAD  Assessment  [name] is a 32 y.o. female with a past psychiatric history of borderline personality disorder, schizoaffective disorder bipolar type, and PTSD.  She has had numerous behavioral health admissions and an extensive history of suicide attempts.  Patient presents to [facility] ED for an attempted overdose on her prescription medications.  During this hospitalization, patient has required restraints due to increased agitation, disruptive behavior, and increased risk for harming herself. Patient reports command hallucinations, telling her to harm herself and others. She has had multiple suicide attempts this past year and has multiple risk factors, including severe mood instability, impulsivity, and significant interpersonal difficulties, all of which contribute to an increased risk of suicide. Although adequate safety measures remain in place, it is important to acknowledge the behavioral component to patient's disruptive behavior on the unit, consistent with her diagnosis of BPD. There are inconsistencies in patient's report when seen by different members of the team.   We are simplifying patient's medication regimen to avoid polypharmacy especially in the setting of her recent overdose. We also discussed medication management when she does return home including closer monitoring, less access to her medications through a locked cabinet, and her ex-boyfriend being more in charge of her medications in which I confirmed with her ex-boyfriend. Patient has an extensive history of ER visits and hospitalizations in which she has displayed aggression, psychosis, and danger to self while in proximity of a Recruitment consultant. Patient also shows high level of lethality, intensity, and frequency of suicidal attempts with multiple  inpatient psychiatric admissions with little benefit so we continue to recommend [facility]. Patient is currently on the [facility] waitlist and we are working closely with LSW with bed availability.   8/7: Patient is similar to yesterday in terms of symptoms, denying SI, HI and AVH. We having been attempting to contact the patient's outpatient team although they have not answered. At this time, we will uphold the IVC since we have not gotten in touch with the CST team and thus unable to ensure a safe disposition if she returns home. We will continue her current medication regimen and monitor for behaviors concerning for threat to the patient's life.  Diagnoses:  Active Hospital problems: Principal Problem:   Bipolar I disorder, current or most recent episode depressed, with psychotic features (HCC) Active Problems:   Asthma   GAD (generalized anxiety disorder)   Suicide attempt (HCC)   Overdose   Drug overdose    Plan   ## Psychiatric Medication Recommendations:  -- Continue Zyprexa 20mg  po nightly for psychosis -- Continue Cymbalta 60 mg daily  -- Continue Gabapentin 600 mg TID for agitated mood/anxiety -- PRN Zyprexa 10mg  IM for agitation -- PRN Ativan 0.5 mg oral BID PRN for agitation -- PRN Zyprexa zydis 5 mg BID for agitation  ## Medical Decision Making Capacity:   Capacity was not formally addressed during this encounter  ## Further Work-up:  -- Per primary -- most recent EKG on 08/30/2022 had QtC of 469  ## Disposition:  -- We recommend inpatient psychiatric hospitalization after medical hospitalization. Patient has been involuntarily committed on 09/04/2022.  -- IVC renewed on 8/7  ## Behavioral / Environmental:  --  To minimize splitting of staff, assign one staff person to communicate all information from the team when feasible. -- Apply the established  behavioral rules and protocols consistently throughout the night (see nursing care order for details). Ensure that all staff  members are aware of and adhere to the same guidelines to avoid confusion and mixed messages.   ## Safety and Observation Level:  - Based on my clinical evaluation, I estimate the patient to be at elevated risk of self harm in the current setting - At this time, we recommend continuing a 1: 1 level of observation. This decision is based on my review of the chart including patient's history and current presentation, interview of the patient, mental status examination, and consideration of suicide risk including evaluating suicidal ideation, plan, intent, suicidal or self-harm behaviors, risk factors, and protective factors. This judgment is based on our ability to directly address suicide risk, implement suicide prevention strategies and develop a safety plan while the patient is in the clinical setting. Please contact our team if there is a concern that risk level has changed.  Suicide risk assessment  Patient has following modifiable risk factors for suicide: under treated depression  and recklessness, which we are addressing by continuing adequate safety precautions while she is hospitalized and adjusting medications   Patient has following non-modifiable or demographic risk factors for suicide: history of suicide attempt, history of self harm behavior, and psychiatric hospitalization  Patient has the following protective factors against suicide: Access to outpatient mental health care  Thank you for this consult request. Recommendations have been communicated to the primary team.  We will follow at this time.   Kizzie Ide, MD PGY-2 Psychiatry   Psychiatric and Social History   Relevant Aspects of Hospital Course:  Admitted on 08/22/2022 for suicide attempt by overdose on her prescription medication.   Patient Report:  Per nursing, no behavioral events overnight.  Patient seen this morning.  Patient feels "okay".  She reports fair sleep and appetite.  She denies SI, HI, and AVH.   Patient has if she will be able to return home.  We discussed how we are continuing to attempt to contact her CST team for the MRI to come evaluate the patient and ensure she will be seen frequently if she were to return home.  Discussed goals of treatment along with motivational factors like her daughter and her career that prevent her from behavioral outbursts.  Patient continues to deny adverse effects from her medication.  Psychiatric ROS Generally reported less sx today than earlier in hospital stay, trying to attain goal of discharge.   Past Psychiatric History: Previous Psych Diagnoses: Schizoaffective disorder bipolar type, Anxiety, Autism spectrum disorder, PTSD, Borderline personality disorder Prior inpatient treatment: multiple, 15 ED visits and 4 inpatient admissions since 06/2022, admitted to [facility] from 07/29/22-08/02/2022, has been to [facility] for eight months in 2016, and was discharged from inpatient facility past week Current/prior outpatient treatment: [facility], CST team twice a week Prior rehab hx: denies multiple Psychotherapy hx: [facility] History of suicide: 20-30 times by overdosing on pills, cutting or drinking 409 disinfectant History of homicide or aggression: denies Psychiatric medication history:  Previously trialed Tegretol, Haldol, Clozaril, Depakote, Invega, Lamictal, Seroquel, asenapine, abilify, vraylar (worsened suicidal thoughts), Lexapro, Zoloft  Neuromodulation history: denies  Social History:  Marital status: Widowed Children: 60 year old daughter Lives: with ex-boyfriend [name], in two story home Employment: Disability, 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  Access to weapons: None   Exam Findings   Psychiatric Specialty Exam:  Presentation  General  Appearance: Appropriate for Environment; Casual  Eye Contact:Good  Speech:Clear and Coherent  Speech  Volume:Normal  Handedness:Right   Mood and Affect  Mood:-- depressed  Affect:-- congruent, restrictive   Thought Process  Thought Processes:Linear; Goal Directed; Coherent  Descriptions of Associations:Intact  Orientation:Full (Time, Place and Person)  Thought Content:Logical  History of Schizophrenia/Schizoaffective disorder: Yes  Duration of Psychotic Symptoms:Greater than six months  Hallucinations: None  Ideas of Reference:None  Suicidal Thoughts: Denies  Homicidal Thoughts: Denies   Sensorium  Memory:Immediate Good; Recent Good; Remote Good  Judgment:Poor  Insight: Fair   Art therapist  Concentration:Fair  Attention Span:Fair  Recall:Fair  [employer] of Knowledge:Fair  Language:Fair   Psychomotor Activity  Psychomotor Activity: None   Assets  Assets:Communication Skills; Desire for Improvement   Sleep  Sleep: Fair    Physical Exam: Vital signs:  Temp:  [97.7 F (36.5 C)-97.9 F (36.6 C)] 97.9 F (36.6 C) (08/07 0900) Pulse Rate:  [88-115] 92 (08/07 1000) Resp:  [16-18] 16 (08/07 0900) BP: (119-125)/(62-68) 119/62 (08/07 0900) SpO2:  [98 %] 98 % (08/07 0900) Physical Exam Vitals and nursing note reviewed.  Constitutional:      General: She is not in acute distress.    Appearance: She is not ill-appearing.  Pulmonary:     Effort: Pulmonary effort is normal. No respiratory distress.  Skin:    General: Skin is warm and dry.  Neurological:     General: No focal deficit present.     Mental Status: She is alert.  Psychiatric:        Attention and Perception: Attention normal. She does not perceive visual hallucinations.        Speech: Speech normal.        Behavior: Behavior is not aggressive or combative. Behavior is cooperative.        Thought Content: Thought content is not paranoid. Thought content does not include homicidal or suicidal ideation. Thought content does not include suicidal plan.        Judgment: Judgment is  impulsive.     Blood pressure 119/62, pulse 92, temperature 97.9 F (36.6 C), temperature source Oral, resp. rate 16, height 5\' 5"  (1.651 m), weight 98.2 kg, last menstrual period 08/08/2022, SpO2 98%. Body mass index is 36.03 kg/m.   Other History   These have been pulled in through the EMR, reviewed, and updated if appropriate.   Family History:  The patient's family history includes Asthma in her father; Mental illness in her father; PDD in her brother; Seizures in her brother.  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    Chronic constipation    Depression    Drug-seeking behavior    Essential tremor    Headache    Ineffective individual coping 05/16/2022   Insomnia 01/12/2022   Intentional drug overdose (HCC) 06/05/2022   Neuromuscular disorder (HCC)    Normocytic anemia 06/05/2022   Overdose 07/22/2017   Overdose of acetaminophen 07/2017   and other meds   Overdose, intentional self-harm, initial encounter (HCC) 07/20/2021   Paranoia (HCC) 04/22/2021   Personality disorder (HCC)    Purposeful non-suicidal drug ingestion (  HCC) 06/27/2021   Schizo-affective psychosis (HCC)    Schizoaffective disorder (HCC) 07/29/2022   Schizoaffective disorder, bipolar type (HCC)    Seizures (HCC)    Last seizure December 2017   Skin erythema 04/27/2022   Sleep apnea    Suicidal behavior 07/25/2021   Suicidal ideation    Suicide (HCC) 07/01/2021   Suicide attempt (HCC) 07/04/2021    Surgical History: Past Surgical History:  Procedure Laterality Date   MOUTH SURGERY  2009 or 2010    Medications:   Current Facility-Administered Medications:     0.9 %  sodium chloride infusion, 250 mL, Intravenous, Continuous, Gonfa, Taye T, MD, Stopped at 08/24/22 1347   albuterol (PROVENTIL) (2.5 MG/3ML) 0.083% nebulizer solution 3 mL, 3 mL, Inhalation, Q6H PRN, Alanda Slim, Taye T, MD   cyclobenzaprine (FLEXERIL) tablet 10 mg, 10 mg, Oral, QHS, Gonfa, Taye T, MD, 10 mg at 09/11/22 2043   diclofenac Sodium (VOLTAREN) 1 % topical gel 2 g, 2 g, Topical, QID, Gonfa, Taye T, MD, 2 g at 09/12/22 1437   DULoxetine (CYMBALTA) DR capsule 60 mg, 60 mg, Oral, Daily, Akintayo, Mojeed, MD, 60 mg at 09/12/22 0931   ferrous sulfate tablet 325 mg, 325 mg, Oral, BID WC, Gonfa, Taye T, MD, 325 mg at 09/12/22 0931   gabapentin (NEURONTIN) capsule 600 mg, 600 mg, Oral, TID, Alanda Slim, Taye T, MD, 600 mg at 09/12/22 0931   hydrOXYzine (ATARAX) tablet 25 mg, 25 mg, Oral, TID PRN, Alanda Slim, Taye T, MD, 25 mg at 09/12/22 0931   ibuprofen (ADVIL) tablet 600 mg, 600 mg, Oral, TID PRN, Alanda Slim, Taye T, MD, 600 mg at 09/12/22 0932   LORazepam (ATIVAN) tablet 0.5 mg, 0.5 mg, Oral, BID PRN, Alanda Slim, Taye T, MD, 0.5 mg at 09/11/22 1426   mometasone-formoterol (DULERA) 200-5 MCG/ACT inhaler 2 puff, 2 puff, Inhalation, BID, Alanda Slim, Taye T, MD, 2 puff at 09/12/22 0818   mupirocin cream (BACTROBAN) 2 %, , Topical, Daily, Marguerita Merles Latif, DO, Given at 09/12/22 0932   OLANZapine (ZYPREXA) injection 10 mg, 10 mg, Intramuscular, Once PRN, Akintayo, Mojeed, MD   OLANZapine (ZYPREXA) tablet 20 mg, 20 mg, Oral, Daily, Mariel Craft, MD, 20 mg at 09/11/22 2044   OLANZapine zydis (ZYPREXA) disintegrating tablet 5 mg, 5 mg, Oral, BID PRN, Mariel Craft, MD   ondansetron (ZOFRAN-ODT) disintegrating tablet 4 mg, 4 mg, Oral, Q8H PRN, Sheikh, Omair Latif, DO, 4 mg at 09/01/22 2025   Oral care mouth rinse, 15 mL, Mouth Rinse, PRN, Osvaldo Shipper, MD   pantoprazole (PROTONIX) EC tablet 40 mg, 40 mg, Oral, QAC breakfast, Gonfa, Taye T, MD, 40 mg at 09/12/22 0931   polyethylene glycol (MIRALAX / GLYCOLAX)  packet 17 g, 17 g, Oral, BID PRN, Alanda Slim, Taye T, MD, 17 g at 09/12/22 0931   prazosin (MINIPRESS) capsule 1 mg, 1 mg, Oral, QHS, Gonfa, Taye T, MD, 1 mg at 09/11/22 2044   senna-docusate (Senokot-S) tablet 1 tablet, 1 tablet, Oral, BID PRN, Candelaria Stagers T, MD, 1 tablet at 09/12/22 0931   sodium bicarbonate tablet 650 mg, 650 mg, Oral, TID, Candelaria Stagers T, MD, 650 mg at 09/12/22 0931  Allergies: Allergies  Allergen Reactions   Bee Venom Anaphylaxis   Coconut Flavor Anaphylaxis and Rash   Fish Allergy Anaphylaxis   Geodon [Ziprasidone Hcl] Other (See Comments)    Pt states that this medication causes paralysis of the mouth.     Haloperidol And Related Other (See Comments)    Pt states that  this medication causes paralysis of the mouth, jaw locks up   Lithobid [Lithium] Other (See Comments)    Seizure-like activity    Roxicodone [Oxycodone] Other (See Comments)    Hallucinations    Seroquel [Quetiapine] Other (See Comments)    Severe drowsiness - Pt currently taking 12.5 mg BID, she is ok with this dose   Shellfish Allergy Anaphylaxis   Phenergan [Promethazine Hcl] Other (See Comments)    Chest pain     Prilosec [Omeprazole] Nausea And Vomiting and Other (See Comments)    Pt can take protonix with no problems    Sulfa Antibiotics Other (See Comments)    Chest pain    Tegretol [Carbamazepine] Nausea And Vomiting   Prozac [Fluoxetine] Other (See Comments)    Increased Depression and Suicidal thoughts   Tape Other (See Comments)    Skin tears, can only tolerate paper tape.   Tylenol [Acetaminophen] Rash and Other (See Comments)    Rash on face

## 2022-09-12 NOTE — TOC Progression Note (Signed)
Transition of Care [facility]) - Progression Note    Patient Details  Name: [name] MRN: [id number] Date of Birth: 02-Aug-1990  Transition of [facility]) CM/SW Contact  Lorri Frederick, LCSW Phone Number: 09/12/2022, 3:46 PM  Clinical Narrative:   IVC paperwork renewal filled out, sent to magistrate.  Custody order received by fax.  Phone call to [facility], they will send [employer] officer for service.         Expected Discharge Plan and Services                                               Social Determinants of Health (SDOH) Interventions SDOH Screenings   Food Insecurity: No Food Insecurity (07/29/2022)  Housing: Low Risk  (07/29/2022)  Transportation Needs: No Transportation Needs (07/29/2022)  Recent Concern: Transportation Needs - Unmet Transportation Needs (05/06/2022)  Utilities: Not At Risk (07/29/2022)  Alcohol Screen: Low Risk  (07/29/2022)  Depression (PHQ2-9): Medium Risk (03/10/2022)  Tobacco Use: Medium Risk (09/07/2022)    Readmission Risk Interventions    06/06/2022    3:38 PM  Readmission Risk Prevention Plan  Transportation Screening Complete  Medication Review (RN Care Manager) Complete  PCP or Specialist appointment within 3-5 days of discharge Complete  HRI or Home Care Consult Complete  SW Recovery Care/Counseling Consult Complete  Palliative Care Screening Not Applicable  Skilled Nursing Facility Not Applicable

## 2022-09-12 NOTE — Plan of Care (Signed)
Uneventful night. Pt is calm and cooperative throughout the shift though expressed frustration about being in the hospital for too long.  Problem: Education: Goal: Knowledge of General Education information will improve Description: Including pain rating scale, medication(s)/side effects and non-pharmacologic comfort measures Outcome: Progressing   Problem: Health Behavior/Discharge Planning: Goal: Ability to manage health-related needs will improve Outcome: Progressing   Problem: Clinical Measurements: Goal: Ability to maintain clinical measurements within normal limits will improve Outcome: Progressing

## 2022-09-13 DIAGNOSIS — F315 Bipolar disorder, current episode depressed, severe, with psychotic features: Secondary | ICD-10-CM | POA: Diagnosis not present

## 2022-09-13 MED ORDER — OLANZAPINE 10 MG PO TABS
5.0000 mg | ORAL_TABLET | Freq: Once | ORAL | Status: AC
  Administered 2022-09-13: 5 mg via ORAL
  Filled 2022-09-13: qty 1

## 2022-09-13 NOTE — Progress Notes (Signed)
[facility] PROGRESS NOTE  [name] (DOB: 07/17/1990) WUJ:[id number] PCP: [name]  Brief Narrative: 32 y.o. F with PMH of adjustment disorder with mixed anxiety and depressed mood, autism, bipolar 1 disorder, fragile x syndrome, seizures not on meds, schizoaffective disorder and multiple suicide attempts in the last year who presented to the ED after ingesting numerous medications in a suicide attempt. She reported taking #30 metoprolol 25mg  tablets, #60 Hydroxyzine 25mg , #7 Lexapro 20mg , #21 Gabapentin 600mg , #7 Flexeril 10mg , #7 protonix 40mg  and #7 Prazosin 1mg . She was slightly bradycardic and hypotensive on EMS arrival which improved with fluids. Poison control was contacted and as she was within 2hrs of ingestion was given activated charcoal. Placed under IVC in ED. Poison control also recommended ICU admission for close Qtc and electrolyte monitoring and possible pressors. At the time of admission, pt was somnolent but arousable to voice and following commands. She was intubated and started on vasopressor and glucagon. Also started on ceftriaxone for possible pneumonia. Patient was extubated on 7/18 and transferred to the floor on 7/19. She was transferred to [facility] service on 08/25/22. Psychiatry continues to follow. They recommend inpatient psychiatric hospitalization.   Subjective: Resting in bed, has no complaints. Specifically denies pain or trouble breathing.   Objective: BP 115/66 (BP Location: Left Arm)   Pulse 97   Temp 99 F (37.2 C)   Resp 18   Ht 5\' 5"  (1.651 m)   Wt 98.2 kg   LMP 08/08/2022 (Approximate)   SpO2 98%   BMI 36.03 kg/m   No distress. Breathing nonlabored  Assessment & Plan: Suicidal Attempt with intentional overdose:  Overdosed with multiple medications including #30 metoprolol 25mg  tablets, #60 Hydroxyzine 25mg , #7 Lexapro 20mg , #21 Gabapentin 600mg , #7 Flexeril 10mg , #7 protonix 40mg  and #7 Prazosin 1mg .  Poison control  consulted. Received charcoal. Denies ETOH or illicit drug use.  Extended UDS negative. Required vasopressor, glucagon, intubation and mechanical ventilation 7/17-7/18.  Now stable. Currently she is involuntarily committed. Psychiatry is following closely. She continues to mention suicidal ideation. - IVC renewed 8/7   Anxiety and Depression, Schizoaffective Disorder, Adjustment Disorder, bipolar disorder, fibromyalgia and auditory hallucination: Patient noted to be on Cogentin, Cymbalta, Neurontin, Zyprexa, prazosin. Psychiatry follows closely. She was on Lexapro prior to admission which has been changed over to Cymbalta. -Patient started hearing voices in her head that were "too much for her to handle" and then she threatened "that she was in a kill and strangle one of the staff members" -Intermittently has to be placed in 4-point restraints and given IM Zyprexa.  She still has a one-to-one sitter at bedside and will continue -Cymbalta has been increased from 30 mg to 60 mg daily and will be continuing olanzapine 10 mg p.o. daily nightly for psychosis and continuing gabapentin 600 mg 3 times daily for agitated mood/anxiety - Continues to have periodic episodes of agitation.  Would be in favor of prn ODT zyprexa during these episodes (last was 8/5). Await placement to inpatient psychiatric facility.   CAP Completed course of antibiotics.  Respiratory status is stable.   Essential hypertension  Normotensive. Was apparently on metoprolol prior to admission which is currently on hold. Labs were ordered but patient continues to refuse.   Sinus Tachycardia -Resolved.   Metabolic Acidosis -Improved.  Noted to be on sodium bicarbonate.   Hypomagnesemia Monitor periodically.   Microcytic Anemia Continue ferrous sulfate.  Blood work was ordered but patient refuses.  Hemoglobin was stable previously.  History of Seizure Disorder -Stable.  Not on meds.   History of Asthma -Stable.    GERD/GI Prophylaxis -Continue PPI with Pantoprazole and Famotidine 20 mg po BID   Obesity -Complicates overall prognosis and care -Estimated body mass index is 36.03 kg/m as calculated from the following:   Height as of this encounter: 5\' 5"  (1.651 m).   Weight as of this encounter: 98.2 kg.  -Weight Loss and Dietary Counseling given  Tyrone Nine, MD [facility] [email or web address] 09/13/2022, 10:21 AM

## 2022-09-13 NOTE — Plan of Care (Signed)
  Problem: Coping: Goal: Level of anxiety will decrease Outcome: Progressing   Problem: Elimination: Goal: Will not experience complications related to bowel motility Outcome: Progressing   Problem: Pain Managment: Goal: General experience of comfort will improve Outcome: Progressing   Problem: Safety: Goal: Ability to remain free from injury will improve Outcome: Progressing   Problem: Skin Integrity: Goal: Risk for impaired skin integrity will decrease Outcome: Progressing   

## 2022-09-13 NOTE — TOC Progression Note (Addendum)
Transition of Care [facility]) - Progression Note    Patient Details  Name: Joycelyn Annear MRN: [id number] Date of Birth: 12-29-1990  [facility]) CM/SW Contact  Lorri Frederick, LCSW Phone Number: 09/13/2022, 1:23 PM  Clinical Narrative:   At request of psych team, CSW called and left message with Genie: [phone] ,Lupita Leash [phone] of the [facility] Support team to discuss potential discharge plan back to the community.     CSW also called [facility] at [phone] and they will email Genie and Lupita Leash.  They informed CSW that pt was showing as discharged from CST this week.    1340: TC Genie/[facility].  Asking for call from MD to discuss.  MD informed.      Expected Discharge Plan and Services                                               Social Determinants of Health (SDOH) Interventions SDOH Screenings   Food Insecurity: No Food Insecurity (07/29/2022)  Housing: Low Risk  (07/29/2022)  Transportation Needs: No Transportation Needs (07/29/2022)  Recent Concern: Transportation Needs - Unmet Transportation Needs (05/06/2022)  Utilities: Not At Risk (07/29/2022)  Alcohol Screen: Low Risk  (07/29/2022)  Depression (PHQ2-9): Medium Risk (03/10/2022)  Tobacco Use: Medium Risk (09/07/2022)    Readmission Risk Interventions    06/06/2022    3:38 PM  Readmission Risk Prevention Plan  Transportation Screening Complete  Medication Review (RN Care Manager) Complete  PCP or Specialist appointment within 3-5 days of discharge Complete  HRI or Home Care Consult Complete  SW Recovery Care/Counseling Consult Complete  Palliative Care Screening Not Applicable  Skilled Nursing Facility Not Applicable

## 2022-09-13 NOTE — Consult Note (Addendum)
[name] Psychiatry Consult Evaluation  Service Date: September 13, 2022 LOS:  LOS: 22 days    Primary Psychiatric Diagnoses  Schizoaffective disorder, bipolar type  GAD  Assessment  Layonna Arcos is a 32 y.o. female with a past psychiatric history of borderline personality disorder, schizoaffective disorder bipolar type, and PTSD.  She has had numerous behavioral health admissions and an extensive history of suicide attempts.  Patient presents to [facility] ED for an attempted overdose on her prescription medications.  During this hospitalization, patient has required restraints due to increased agitation, disruptive behavior, and increased risk for harming herself. Patient reports command hallucinations, telling her to harm herself and others. She has had multiple suicide attempts this past year and has multiple risk factors, including severe mood instability, impulsivity, and significant interpersonal difficulties, all of which contribute to an increased risk of suicide. Although adequate safety measures remain in place, it is important to acknowledge the behavioral component to patient's disruptive behavior on the unit, consistent with her diagnosis of BPD. There are inconsistencies in patient's report when seen by different members of the team.   We are simplifying patient's medication regimen to avoid polypharmacy especially in the setting of her recent overdose. We also discussed medication management when she does return home including closer monitoring, less access to her medications through a locked cabinet, and her ex-boyfriend being more in charge of her medications in which I confirmed with her ex-boyfriend. Patient has an extensive history of ER visits and hospitalizations in which she has displayed aggression, psychosis, and danger to self while in proximity of a Recruitment consultant. Patient also shows high level of lethality, intensity, and frequency of suicidal attempts with multiple  inpatient psychiatric admissions with little benefit so we continue to recommend [facility]. Patient is currently on the [facility] waitlist and we are working closely with LSW with bed availability.   8/8: Patient continues to be similar to recent previous evaluations, denying SI and HI.  Patient reports occasional moments of hearing her parents' voices and this seems to be in the context of feeling bored. CST informed us that they have discharged the patient as she needs a higher level of care at this time. We will continue her current medication as it is aiding in mood stabilization and psychosis. We are communicating with the case manager with safety planning.   Diagnoses:  Active Hospital problems: Principal Problem:   Bipolar I disorder, current or most recent episode depressed, with psychotic features (HCC) Active Problems:   Asthma   GAD (generalized anxiety disorder)   Suicide attempt (HCC)   Overdose   Drug overdose    Plan   ## Psychiatric Medication Recommendations:  -- Gave one time Zyprexa 5 mg for auditory disturbances -- Continue Zyprexa 20mg  po nightly for psychosis -- Continue Cymbalta 60 mg daily  -- Continue Gabapentin 600 mg TID for agitated mood/anxiety Agitation Protocol -- PRN Zyprexa 10mg  IM for agitation -- PRN Ativan 0.5 mg oral BID PRN for agitation -- PRN Zyprexa zydis 5 mg BID for agitation  ## Medical Decision Making Capacity:   Capacity was not formally addressed during this encounter  ## Further Work-up:  -- Per primary -- most recent EKG on 08/30/2022 had QtC of 469  ## Disposition:  -- We recommend inpatient psychiatric hospitalization after medical hospitalization. Patient has been involuntarily committed on 09/04/2022.  -- IVC renewed on 8/7 -- Made several attempts to contact CST team in the past days with no answer  ##  Behavioral / Environmental:  --  To minimize splitting of staff, assign one staff person to communicate all information from the team  when feasible. -- Apply the established behavioral rules and protocols consistently throughout the night (see nursing care order for details). Ensure that all staff members are aware of and adhere to the same guidelines to avoid confusion and mixed messages.   ## Safety and Observation Level:  - Based on my clinical evaluation, I estimate the patient to be at elevated risk of self harm in the current setting - At this time, we recommend continuing a 1: 1 level of observation. This decision is based on my review of the chart including patient's history and current presentation, interview of the patient, mental status examination, and consideration of suicide risk including evaluating suicidal ideation, plan, intent, suicidal or self-harm behaviors, risk factors, and protective factors. This judgment is based on our ability to directly address suicide risk, implement suicide prevention strategies and develop a safety plan while the patient is in the clinical setting. Please contact our team if there is a concern that risk level has changed.  Suicide risk assessment  Patient has following modifiable risk factors for suicide: under treated depression  and recklessness, which we are addressing by continuing adequate safety precautions while she is hospitalized and adjusting medications   Patient has following non-modifiable or demographic risk factors for suicide: history of suicide attempt, history of self harm behavior, and psychiatric hospitalization  Patient has the following protective factors against suicide: Access to outpatient mental health care  Thank you for this consult request. Recommendations have been communicated to the primary team.  We will follow at this time.   Kizzie Ide, MD PGY-2 Psychiatry   Psychiatric and Social History   Relevant Aspects of Hospital Course:  Admitted on 08/22/2022 for suicide attempt by overdose on her prescription medication.   Patient Report:  Per  MAR, patient received Zyprexa at 9:30 PM.  Patient seen this morning. Patient feels tired, stating her mood is similar to yesterday. She is reporting "I am ready to go". She reports fair sleep and appetite. She continues to deny SI, HI, and AVH. She denies adverse effects from her medications. I encouraged engaging in activities like drawing and walking down the halls to help her not feel bored. We discussed how the team has been attempting to communication with the CST team.  I returned a few hours later with psychiatry attending Dr. Rebecca Eaton.  At this time, the patient was reporting auditory hallucinations, describing them as her parents' voices telling her to harm herself.  Of note, she experienced this a few days ago as well and states this occurs when she feels bored. She continues to deny SI, HI, and AVH.  I spoke with CST member Genie [phone] regarding safety planning. They informed me that the CST team is discharging due to her high risk of her medication management and she has exhausted her resources with the team. They also stated that ACT team do not take patients with a diagnosed borderline personality disorder. We spoke with the [facility] coordinator [phone] and we discussed the option for a group home and communicating with the APS social worker. We will continue communicating with the [facility] coordinator about disposition options.   We discussed the update with the patient regarding the CST team and patient then reported active SI after receiving the update and now prefers to go to [facility].  Psychiatric ROS Occasional AH, hearing her parents' voices, previously  stating "its all in my head".  Past Psychiatric History: Previous Psych Diagnoses: Schizoaffective disorder bipolar type, Anxiety, Autism spectrum disorder, PTSD, Borderline personality disorder Prior inpatient treatment: multiple, 15 ED visits and 4 inpatient admissions since 06/2022, admitted to [facility] from  07/29/22-08/02/2022, has been to [facility] for eight months in 2016, and was discharged from inpatient facility past week Current/prior outpatient treatment: [facility], CST team twice a week Prior rehab hx: denies multiple Psychotherapy hx: [facility] History of suicide: 20-30 times by overdosing on pills, cutting or drinking 409 disinfectant History of homicide or aggression: denies Psychiatric medication history:  Previously trialed Tegretol, Haldol, Clozaril, Depakote, Invega, Lamictal, Seroquel, asenapine, abilify, vraylar (worsened suicidal thoughts), Lexapro, Zoloft  Neuromodulation history: denies  Social History:  Marital status: Widowed Children: 2 year old daughter Lives: with ex-boyfriend Fayrene Fearing, in two story home Employment: Disability, 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  Access to weapons: None   Exam Findings   Psychiatric Specialty Exam:  Presentation  General Appearance: Appropriate for Environment; Casual  Eye Contact:Good  Speech:Clear and Coherent  Speech Volume:Normal  Handedness:Right   Mood and Affect  Mood:-- "Okay"  Affect:-- congruent, restrictive   Thought Process  Thought Processes:Linear; Goal Directed; Coherent  Descriptions of Associations:Intact  Orientation:Full (Time, Place and Person)  Thought Content:Logical  History of Schizophrenia/Schizoaffective disorder: Yes  Duration of Psychotic Symptoms:Greater than six months  Hallucinations: Occasionally hearing her parents' voice  Ideas of Reference:None  Suicidal Thoughts: Denies  Homicidal Thoughts: Denies   Sensorium  Memory:Immediate Good; Recent Good; Remote Good  Judgment:Poor  Insight: Chief Executive Officer  Concentration:Fair  Attention Span:Fair  Recall:Fair  [employer] of Knowledge:Fair  Language:Fair   Psychomotor Activity  Psychomotor Activity: None   Assets  Assets:Communication Skills;  Desire for Improvement   Sleep  Sleep: Fair    Physical Exam: Vital signs:  Temp:  [97.9 F (36.6 C)-99 F (37.2 C)] 99 F (37.2 C) (08/08 0536) Pulse Rate:  [88-115] 97 (08/08 0536) Resp:  [16-20] 18 (08/08 0536) BP: (115-126)/(62-67) 115/66 (08/08 0536) SpO2:  [98 %-100 %] 98 % (08/08 0536) Physical Exam Vitals and nursing note reviewed.  Constitutional:      General: She is not in acute distress.    Appearance: She is not ill-appearing.  Pulmonary:     Effort: Pulmonary effort is normal. No respiratory distress.  Skin:    General: Skin is warm and dry.  Neurological:     General: No focal deficit present.     Mental Status: She is alert.  Psychiatric:        Attention and Perception: Attention normal. She does not perceive visual hallucinations.        Speech: Speech normal.        Behavior: Behavior is not aggressive or combative. Behavior is cooperative.        Thought Content: Thought content is not paranoid. Thought content does not include homicidal or suicidal ideation. Thought content does not include suicidal plan.        Judgment: Judgment is impulsive.     Blood pressure 115/66, pulse 97, temperature 99 F (37.2 C), resp. rate 18, height 5\' 5"  (1.651 m), weight 98.2 kg, last menstrual period 08/08/2022, SpO2 98%. Body mass index is 36.03 kg/m.   Other History   These have been pulled in through the EMR, reviewed, and updated if appropriate.   Family History:  The patient's family history  includes Asthma in her father; Mental illness in her father; PDD in her brother; Seizures in her brother.  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  fragile X syndrome    Chronic constipation    Depression    Drug-seeking behavior    Essential tremor    Headache    Ineffective individual coping 05/16/2022   Insomnia 01/12/2022   Intentional drug overdose (HCC) 06/05/2022   Neuromuscular disorder (HCC)    Normocytic anemia 06/05/2022   Overdose 07/22/2017   Overdose of acetaminophen 07/2017   and other meds   Overdose, intentional self-harm, initial encounter (HCC) 07/20/2021   Paranoia (HCC) 04/22/2021   Personality disorder (HCC)    Purposeful non-suicidal drug ingestion (HCC) 06/27/2021   Schizo-affective psychosis (HCC)    Schizoaffective disorder (HCC) 07/29/2022   Schizoaffective disorder, bipolar type (HCC)    Seizures (HCC)    Last seizure December 2017   Skin erythema 04/27/2022   Sleep apnea    Suicidal behavior 07/25/2021   Suicidal ideation    Suicide (HCC) 07/01/2021   Suicide attempt (HCC) 07/04/2021    Surgical History: Past Surgical History:  Procedure Laterality Date   MOUTH SURGERY  2009 or 2010    Medications:   Current Facility-Administered Medications:    0.9 %  sodium chloride infusion, 250 mL, Intravenous, Continuous, Gonfa, Taye T, MD, Stopped at 08/24/22 1347   albuterol (PROVENTIL) (2.5 MG/3ML) 0.083% nebulizer solution 3 mL, 3 mL, Inhalation, Q6H PRN, Alanda Slim, Taye T, MD   cyclobenzaprine (FLEXERIL) tablet 10 mg, 10 mg, Oral, QHS, Gonfa, Taye T, MD, 10 mg at 09/12/22 2136   diclofenac Sodium (VOLTAREN) 1 % topical gel 2 g, 2 g, Topical, QID, Gonfa, Taye T, MD, 2 g at 09/12/22 2146   DULoxetine (CYMBALTA) DR capsule 60 mg, 60 mg, Oral, Daily, Akintayo, Mojeed, MD, 60 mg at 09/12/22 0931   ferrous sulfate tablet 325 mg, 325 mg, Oral, BID WC, Gonfa, Taye T, MD, 325 mg at 09/12/22 1622   gabapentin (NEURONTIN) capsule 600 mg, 600 mg, Oral, TID, Alanda Slim, Taye T, MD, 600 mg at 09/12/22 2136   hydrOXYzine (ATARAX) tablet 25 mg, 25 mg, Oral, TID PRN, Alanda Slim, Taye T, MD, 25 mg at 09/12/22 0931    ibuprofen (ADVIL) tablet 600 mg, 600 mg, Oral, TID PRN, Alanda Slim, Taye T, MD, 600 mg at 09/12/22 1622   LORazepam (ATIVAN) tablet 0.5 mg, 0.5 mg, Oral, BID PRN, Alanda Slim, Taye T, MD, 0.5 mg at 09/12/22 2142   mometasone-formoterol (DULERA) 200-5 MCG/ACT inhaler 2 puff, 2 puff, Inhalation, BID, Alanda Slim, Taye T, MD, 2 puff at 09/12/22 2004   mupirocin cream (BACTROBAN) 2 %, , Topical, Daily, Marguerita Merles Coffey, DO, Given at 09/12/22 0932   OLANZapine (ZYPREXA) injection 10 mg, 10 mg, Intramuscular, Once PRN, Akintayo, Mojeed, MD   OLANZapine (ZYPREXA) tablet 20 mg, 20 mg, Oral, Daily, Mariel Craft, MD, 20 mg at 09/12/22 2135   OLANZapine zydis (ZYPREXA) disintegrating tablet 5 mg, 5 mg, Oral, BID PRN, Mariel Craft, MD, 5 mg at 09/12/22 2135   ondansetron (ZOFRAN-ODT) disintegrating tablet 4 mg, 4 mg, Oral, Q8H PRN, Marguerita Merles  Latif, DO, 4 mg at 09/01/22 2025   Oral care mouth rinse, 15 mL, Mouth Rinse, PRN, Osvaldo Shipper, MD   pantoprazole (PROTONIX) EC tablet 40 mg, 40 mg, Oral, QAC breakfast, Gonfa, Taye T, MD, 40 mg at 09/12/22 0931   polyethylene glycol (MIRALAX / GLYCOLAX) packet 17 g, 17 g, Oral, BID PRN, Alanda Slim, Taye T, MD, 17 g at 09/12/22 2136   prazosin (MINIPRESS) capsule 1 mg, 1 mg, Oral, QHS, Gonfa, Taye T, MD, 1 mg at 09/12/22 2140   senna-docusate (Senokot-S) tablet 1 tablet, 1 tablet, Oral, BID PRN, Candelaria Stagers T, MD, 1 tablet at 09/12/22 2135   sodium bicarbonate tablet 650 mg, 650 mg, Oral, TID, Candelaria Stagers T, MD, 650 mg at 09/12/22 1622  Allergies: Allergies  Allergen Reactions   Bee Venom Anaphylaxis   Coconut Flavor Anaphylaxis and Rash   Fish Allergy Anaphylaxis   Geodon [Ziprasidone Hcl] Other (See Comments)    Pt states that this medication causes paralysis of the mouth.     Haloperidol And Related Other (See Comments)    Pt states that this medication causes paralysis of the mouth, jaw locks up   Lithobid [Lithium] Other (See Comments)    Seizure-like activity     Roxicodone [Oxycodone] Other (See Comments)    Hallucinations    Seroquel [Quetiapine] Other (See Comments)    Severe drowsiness - Pt currently taking 12.5 mg BID, she is ok with this dose   Shellfish Allergy Anaphylaxis   Phenergan [Promethazine Hcl] Other (See Comments)    Chest pain     Prilosec [Omeprazole] Nausea And Vomiting and Other (See Comments)    Pt can take protonix with no problems    Sulfa Antibiotics Other (See Comments)    Chest pain    Tegretol [Carbamazepine] Nausea And Vomiting   Prozac [Fluoxetine] Other (See Comments)    Increased Depression and Suicidal thoughts   Tape Other (See Comments)    Skin tears, can only tolerate paper tape.   Tylenol [Acetaminophen] Rash and Other (See Comments)    Rash on face

## 2022-09-13 NOTE — Plan of Care (Addendum)
Patient still stating she is going to kill herself and others around her.    Problem: Education: Goal: Knowledge of General Education information will improve Description: Including pain rating scale, medication(s)/side effects and non-pharmacologic comfort measures Outcome: Progressing   Problem: Activity: Goal: Risk for activity intolerance will decrease Outcome: Progressing   Problem: Pain Managment: Goal: General experience of comfort will improve Outcome: Progressing   Problem: Safety: Goal: Ability to remain free from injury will improve Outcome: Progressing   Problem: Skin Integrity: Goal: Risk for impaired skin integrity will decrease Outcome: Progressing

## 2022-09-13 NOTE — Plan of Care (Signed)
I spoke with CST member Genie [phone] regarding safety planning. They informed me that the CST team is discharging due to her high risk of her medication management and she has exhausted her resources with the team. They also stated that ACT team do not take patients with a diagnosed borderline personality disorder. We spoke with the [facility] coordinator [phone] and we discussed the option for a group home and communicating with the APS social worker. We will continue communicating with the [facility] coordinator about disposition options.  Kizzie Ide, MD PGY-2 Psychiatry

## 2022-09-14 DIAGNOSIS — F315 Bipolar disorder, current episode depressed, severe, with psychotic features: Secondary | ICD-10-CM | POA: Diagnosis not present

## 2022-09-14 NOTE — Care Management Important Message (Signed)
Important Message  Patient Details  Name: Patrycia Mountz MRN: [id number] Date of Birth: [birth year]   Medicare Important Message Given:  Yes     Sherilyn Banker 09/14/2022, 2:52 PM

## 2022-09-14 NOTE — Consult Note (Signed)
[name] Consult Evaluation  Service Date: September 14, 2022 LOS:  LOS: 23 days    Primary Psychiatric Diagnoses  Schizoaffective disorder, bipolar type  GAD Borderline personality disorder  Assessment  Jovanne Shenette Holst is a 32 y.o. female with a past psychiatric history of borderline personality disorder, schizoaffective disorder bipolar type, and PTSD.  She has had numerous behavioral health admissions and an extensive history of suicide attempts.  Patient presents to [facility] ED for an attempted overdose on her prescription medications.  During this hospitalization, patient has required restraints due to increased agitation, disruptive behavior, and increased risk for harming herself. Patient reports command hallucinations, telling her to harm herself and others. She has had multiple suicide attempts this past year and has multiple risk factors, including severe mood instability, impulsivity, and significant interpersonal difficulties, all of which contribute to an increased risk of suicide. Although adequate safety measures remain in place on the inpatient medical floor, it is important to acknowledge the behavioral component to patient's disruptive behavior on the unit, consistent with her diagnosis of BPD. There are inconsistencies in patient's report when seen by different members of the team.   We are simplifying patient's medication regimen to avoid polypharmacy especially in the setting of her recent overdose. Earlier in hospitalization during a period of improvement (roughly 8/1 to 8/8), we  discussed medication management if and when she does return home including closer monitoring, less access to her medications through a locked cabinet, and her ex-boyfriend being more in charge of her medications in which was confirmed with her ex-boyfriend. Patient has an extensive history of ER visits and hospitalizations in which she has displayed aggression, psychosis, and danger to  self while in proximity of a Recruitment consultant. Patient also shows high level of lethality, intensity, and frequency of suicidal attempts with multiple inpatient psychiatric admissions with little benefit so we continue to recommend [facility]. Patient is currently on the [facility] waitlist and we are working closely with LSW with bed availability. She was informed on 8/8 that she has been dropped by her CST; this caused acute worsening of reported hallucinations, suicidality, homicidality and agitation (fear of abandonment is central feature in BPD) and consideration of community f/u was terminated.   8/8: Patient continues to be similar to recent previous evaluations, denying SI and HI.  Patient reports occasional moments of hearing her parents' voices and this seems to be in the context of feeling bored. CST informed us that they have discharged the patient as she needs a higher level of care at this time; she is similarly  likelyineligible for ACT services. We will continue her current medication as it is aiding in mood stabilization and psychosis. Today I pulled in several recent nursing notes to hopefully illustrate decompensation over past week.   Diagnoses:  Active Hospital problems: Principal Problem:   Bipolar I disorder, current or most recent episode depressed, with psychotic features (HCC) Active Problems:   Asthma   GAD (generalized anxiety disorder)   Suicide attempt (HCC)   Overdose   Drug overdose    Plan   ## Psychiatric Medication Recommendations:  -- Continue Zyprexa 20mg  po nightly for psychosis -- Continue Cymbalta 60 mg daily  -- Continue Gabapentin 600 mg TID for agitated mood/anxiety Agitation Protocol -- PRN Zyprexa 10mg  IM for agitation -- PRN Ativan 0.5 mg oral BID PRN for agitation -- PRN Zyprexa zydis 5 mg BID for agitation  ## Medical Decision Making Capacity:   Capacity was not formally  addressed during this encounter  ## Further Work-up:  -- Per primary -- most recent  EKG on 08/30/2022 had QtC of 469  ## Disposition:  -- We recommend inpatient psychiatric hospitalization after medical hospitalization. Patient has been involuntarily committed on 09/04/2022.  -- IVC renewed on 8/7 -- Made several attempts to contact CST team leading into being notified that she was dropped with no answer   ## Behavioral / Environmental:  --  To minimize splitting of staff, assign one staff person to communicate all information from the team when feasible. -- Apply the established behavioral rules and protocols consistently throughout the night (see nursing care order for details). Ensure that all staff members are aware of and adhere to the same guidelines to avoid confusion and mixed messages.   ## Safety and Observation Level:  - Based on my clinical evaluation, I estimate the patient to be at elevated risk of self harm in the current setting - At this time, we recommend continuing a 1: 1 level of observation. This decision is based on my review of the chart including patient's history and current presentation, interview of the patient, mental status examination, and consideration of suicide risk including evaluating suicidal ideation, plan, intent, suicidal or self-harm behaviors, risk factors, and protective factors. This judgment is based on our ability to directly address suicide risk, implement suicide prevention strategies and develop a safety plan while the patient is in the clinical setting. Please contact our team if there is a concern that risk level has changed.  Suicide risk assessment  Patient has following modifiable risk factors for suicide: active suicidal ideation, under treated depression , recklessness, and lack of access to outpatient mental health resources, which we are addressing by continuing adequate safety precautions while she is hospitalized and adjusting medications   Patient has following non-modifiable or demographic risk factors for suicide: early  widowhood, history of suicide attempt, history of self harm behavior, and psychiatric hospitalization  Patient has the following protective factors against suicide: Supportive friends  Thank you for this consult request. Recommendations have been communicated to the primary team.  We will follow at this time.   Kizzie Ide, MD PGY-2 Psychiatry   Psychiatric and Social History   Relevant Aspects of Hospital Course:  Admitted on 08/22/2022 for suicide attempt by overdose on her prescription medication.   Patient Report:  I saw Destinii this morning with the team. She was lying in bed with her covers over her head and minimally engaged in interview. Endorsed ongoing AH, suicidality, and "I want to murder my parents". Attempted to discuss loss of support, relationships, etc with CST and she declined. Does not appear to have gotten out of bed despite numerous attempts to encourage non-pharmacological approaaches to mood.   Collateral Called Debra with CST - stated she would be happy to conduct a termination session with Tongela if she reached out . She has not been called by [name] during this hospitalization.   Called Dr. Pershing Proud with [facility] to discuss pt's recent decompensation   Sample nursing notes illustrating behavior over past 4 days - despite narrative, decompensation copensation predates hearing from CST.   8/5 Tameshia Stamps LPN Pt stated she wanted to kill her self and plan to do so with her blanket. This limited pt. to one blanket  This nurse was notified by sitter that pt. Had something in her mouth.Pt stated " she was going to swallow something to kill her self. Nurse found top to can in pt.  mouth. PT. Room striped and searched all items removed from pt room, call bell not within reach  Pt. Notified by sitter pt. Is laying on floor. This nurse instructed pt. To move back to bed. Which she did with no problem.  Pt. Made scratches on left forearm using nails  8/6-8/8 relatively calm  until notified of being dropped by CST  8/9 early AM Sherron Courtney RN:  Soon into shift Pt attempted to repetitively bump back of head against wall, stating "the voices, the voices, I want to go to central, the voices", this nurse was able to calmly redirect patient to sit at bedside, no further issues this shift.    [employer] nto note above   Past Psychiatric History: Previous Psych Diagnoses: Schizoaffective disorder bipolar type, Anxiety, Autism spectrum disorder, PTSD, Borderline personality disorder Prior inpatient treatment: multiple, 15 ED visits and 4 inpatient admissions since 06/2022, admitted to [facility] from 07/29/22-08/02/2022, has been to [facility] for eight months in 2016, and was discharged from inpatient facility past week Current/prior outpatient treatment: [facility], CST team twice a week Prior rehab hx: denies multiple Psychotherapy hx: [facility] History of suicide: 20-30 times by overdosing on pills, cutting or drinking 409 disinfectant History of homicide or aggression: denies Psychiatric medication history:  Previously trialed Tegretol, Haldol, Clozaril, Depakote, Invega, Lamictal, Seroquel, asenapine, abilify, vraylar (worsened suicidal thoughts), Lexapro, Zoloft  Neuromodulation history: denies  Social History:  Marital status: Widowed Children: 52 year old daughter Lives: with ex-boyfriend Fayrene Fearing, in two story home Employment: Disability, 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  Access to weapons: None   Exam Findings   Psychiatric Specialty Exam:  Presentation  General Appearance: -- (huddled under blankets)  Eye Contact:Poor  Speech:-- (rapid)  Speech Volume:Normal  Handedness:Right   Mood and Affect  Mood:-- "Okay"  Affect:-- congruent, restrictive   Thought Process  Thought Processes:Goal Directed (focused on dc)  Descriptions of  Associations:Intact  Orientation:Full (Time, Place and Person)  Thought Content:Logical  History of Schizophrenia/Schizoaffective disorder: Yes  Duration of Psychotic Symptoms:Greater than six months  Hallucinations: Occasionally hearing her parents' voice  Ideas of Reference:Paranoia; Percusatory  Suicidal Thoughts: Denies  Homicidal Thoughts: Denies   Sensorium  Memory:Immediate Good; Recent Good; Remote Good  Judgment:Poor  Insight: Chief Executive Officer  Concentration:Fair  Attention Span:Fair  Recall:Fair  [employer] of Knowledge:Fair  Language:Fair   Psychomotor Activity  Psychomotor Activity: None   Assets  Assets:Communication Skills; Desire for Improvement   Sleep  Sleep: Fair    Physical Exam: Vital signs:  Temp:  [97.5 F (36.4 C)-98.8 F (37.1 C)] 98.8 F (37.1 C) (08/09 1417) Pulse Rate:  [92-108] 92 (08/09 1417) Resp:  [16-18] 18 (08/09 1417) BP: (103-118)/(54-74) 114/70 (08/09 1417) SpO2:  [97 %-100 %] 99 % (08/09 1417) Physical Exam Vitals and nursing note reviewed.  Constitutional:      General: She is not in acute distress.    Appearance: She is not ill-appearing.  Pulmonary:     Effort: Pulmonary effort is normal. No respiratory distress.  Skin:    General: Skin is warm and dry.  Neurological:     General: No focal deficit present.     Mental Status: She is alert.  Psychiatric:        Attention and Perception: Attention normal.        Speech: Speech normal.        Behavior: Behavior is uncooperative.  Blood pressure 114/70, pulse 92, temperature 98.8 F (37.1 C), temperature source Oral, resp. rate 18, height 5\' 5"  (1.651 m), weight 98.2 kg, last menstrual period 08/08/2022, SpO2 99%. Body mass index is 36.03 kg/m.   Other History   These have been pulled in through the EMR, reviewed, and updated if appropriate.   Family History:  The patient's family history includes Asthma in her father; Mental illness in  her father; PDD in her brother; Seizures in her brother.  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    Chronic constipation    Depression    Drug-seeking behavior    Essential tremor    Headache    Ineffective individual coping 05/16/2022   Insomnia 01/12/2022   Intentional drug overdose (HCC) 06/05/2022   Neuromuscular disorder (HCC)    Normocytic anemia 06/05/2022   Overdose 07/22/2017   Overdose of acetaminophen 07/2017   and other meds   Overdose, intentional self-harm, initial encounter (HCC) 07/20/2021   Paranoia (HCC) 04/22/2021   Personality disorder (HCC)    Purposeful non-suicidal drug ingestion (HCC) 06/27/2021   Schizo-affective psychosis (HCC)    Schizoaffective disorder (HCC) 07/29/2022   Schizoaffective disorder, bipolar type (HCC)    Seizures (HCC)    Last seizure December 2017   Skin erythema 04/27/2022   Sleep apnea    Suicidal behavior 07/25/2021   Suicidal ideation    Suicide (HCC) 07/01/2021   Suicide attempt (HCC) 07/04/2021    Surgical History: Past Surgical History:  Procedure Laterality Date   MOUTH SURGERY  2009 or 2010    Medications:   Current Facility-Administered Medications:    0.9 %  sodium chloride infusion, 250 mL, Intravenous, Continuous, Gonfa, Taye T, MD, Stopped at 08/24/22 1347   albuterol (PROVENTIL) (2.5 MG/3ML) 0.083% nebulizer solution 3 mL, 3 mL, Inhalation, Q6H PRN, Alanda Slim, Taye T, MD   cyclobenzaprine (FLEXERIL) tablet 10 mg, 10 mg, Oral, QHS, Gonfa, Taye T, MD, 10 mg at 09/13/22 2226   diclofenac Sodium (VOLTAREN) 1 % topical gel 2 g, 2 g, Topical, QID, Gonfa, Taye T, MD, 2 g at 09/14/22  1722   DULoxetine (CYMBALTA) DR capsule 60 mg, 60 mg, Oral, Daily, Akintayo, Mojeed, MD, 60 mg at 09/14/22 0850   ferrous sulfate tablet 325 mg, 325 mg, Oral, BID WC, Gonfa, Taye T, MD, 325 mg at 09/14/22 1722   gabapentin (NEURONTIN) capsule 600 mg, 600 mg, Oral, TID, Alanda Slim, Taye T, MD, 600 mg at 09/14/22 1553   hydrOXYzine (ATARAX) tablet 25 mg, 25 mg, Oral, TID PRN, Alanda Slim, Taye T, MD, 25 mg at 09/13/22 1114   ibuprofen (ADVIL) tablet 600 mg, 600 mg, Oral, TID PRN, Alanda Slim, Taye T, MD, 600 mg at 09/13/22 1324   LORazepam (ATIVAN) tablet 0.5 mg, 0.5 mg, Oral, BID PRN, Alanda Slim, Taye T, MD, 0.5 mg at 09/14/22 1347   mometasone-formoterol (DULERA) 200-5 MCG/ACT inhaler 2 puff, 2 puff, Inhalation, BID, Alanda Slim, Taye T, MD, 2 puff at 09/12/22 2004   mupirocin cream (BACTROBAN) 2 %, , Topical, Daily, Marguerita Merles [place], DO, Given at 09/14/22 0853   OLANZapine (ZYPREXA) injection 10 mg, 10 mg,  Intramuscular, Once PRN, Akintayo, Mojeed, MD   OLANZapine (ZYPREXA) tablet 20 mg, 20 mg, Oral, Daily, Mariel Craft, MD, 20 mg at 09/13/22 2226   OLANZapine zydis (ZYPREXA) disintegrating tablet 5 mg, 5 mg, Oral, BID PRN, Mariel Craft, MD, 5 mg at 09/12/22 2135   ondansetron (ZOFRAN-ODT) disintegrating tablet 4 mg, 4 mg, Oral, Q8H PRN, Marguerita Merles Latif, DO, 4 mg at 09/14/22 1722   Oral care mouth rinse, 15 mL, Mouth Rinse, PRN, Osvaldo Shipper, MD   pantoprazole (PROTONIX) EC tablet 40 mg, 40 mg, Oral, QAC breakfast, Gonfa, Taye T, MD, 40 mg at 09/14/22 0850   polyethylene glycol (MIRALAX / GLYCOLAX) packet 17 g, 17 g, Oral, BID PRN, Candelaria Stagers T, MD, 17 g at 09/13/22 2234   prazosin (MINIPRESS) capsule 1 mg, 1 mg, Oral, QHS, Gonfa, Taye T, MD, 1 mg at 09/13/22 2226   senna-docusate (Senokot-S) tablet 1 tablet, 1 tablet, Oral, BID PRN, Candelaria Stagers T, MD, 1 tablet at 09/13/22 2234   sodium bicarbonate tablet 650 mg, 650 mg, Oral, TID, Candelaria Stagers T, MD, 650 mg at 09/14/22 1553  Allergies: Allergies   Allergen Reactions   Bee Venom Anaphylaxis   Coconut Flavor Anaphylaxis and Rash   Fish Allergy Anaphylaxis   Geodon [Ziprasidone Hcl] Other (See Comments)    Pt states that this medication causes paralysis of the mouth.     Haloperidol And Related Other (See Comments)    Pt states that this medication causes paralysis of the mouth, jaw locks up   Lithobid [Lithium] Other (See Comments)    Seizure-like activity    Roxicodone [Oxycodone] Other (See Comments)    Hallucinations    Seroquel [Quetiapine] Other (See Comments)    Severe drowsiness - Pt currently taking 12.5 mg BID, she is ok with this dose   Shellfish Allergy Anaphylaxis   Phenergan [Promethazine Hcl] Other (See Comments)    Chest pain     Prilosec [Omeprazole] Nausea And Vomiting and Other (See Comments)    Pt can take protonix with no problems    Sulfa Antibiotics Other (See Comments)    Chest pain    Tegretol [Carbamazepine] Nausea And Vomiting   Prozac [Fluoxetine] Other (See Comments)    Increased Depression and Suicidal thoughts   Tape Other (See Comments)    Skin tears, can only tolerate paper tape.   Tylenol [Acetaminophen] Rash and Other (See Comments)    Rash on face

## 2022-09-14 NOTE — Progress Notes (Signed)
[facility] PROGRESS NOTE  [name] (DOB: April 15, 1990) OZD:[id number] PCP: [name]  Brief Narrative: 32 y.o. F with PMH of adjustment disorder with mixed anxiety and depressed mood, autism, bipolar 1 disorder, fragile x syndrome, seizures not on meds, schizoaffective disorder and multiple suicide attempts in the last year who presented to the ED after ingesting numerous medications in a suicide attempt. She reported taking #30 metoprolol 25mg  tablets, #60 Hydroxyzine 25mg , #7 Lexapro 20mg , #21 Gabapentin 600mg , #7 Flexeril 10mg , #7 protonix 40mg  and #7 Prazosin 1mg . She was slightly bradycardic and hypotensive on EMS arrival which improved with fluids. Poison control was contacted and as she was within 2hrs of ingestion was given activated charcoal. Placed under IVC in ED. Poison control also recommended ICU admission for close Qtc and electrolyte monitoring and possible pressors. At the time of admission, pt was somnolent but arousable to voice and following commands. She was intubated and started on vasopressor and glucagon. Also started on ceftriaxone for possible pneumonia. Patient was extubated on 7/18 and transferred to the floor on 7/19. She was transferred to [facility] service on 08/25/22. Psychiatry continues to follow. They recommend inpatient psychiatric hospitalization.   Subjective: No complaints this morning. Documentation from overnight: "Soon into shift Pt attempted to repetitively bump back of head against wall, stating "the voices, the voices, I want to go to central, the voices", this nurse was able to calmly redirect patient to sit at bedside, no further issues this shift."  Objective: BP 118/74 (BP Location: Left Arm)   Pulse (!) 101   Temp 98.4 F (36.9 C) (Oral)   Resp 20   Ht 5\' 5"  (1.651 m)   Wt 98.2 kg   LMP 08/08/2022 (Approximate)   SpO2 100%   BMI 36.03 kg/m   No distress, resting quietly.   Assessment & Plan: Suicidal Attempt with  intentional overdose:  Overdosed with multiple medications including #30 metoprolol 25mg  tablets, #60 Hydroxyzine 25mg , #7 Lexapro 20mg , #21 Gabapentin 600mg , #7 Flexeril 10mg , #7 protonix 40mg  and #7 Prazosin 1mg .  Poison control consulted. Received charcoal. Denies ETOH or illicit drug use.  Extended UDS negative. Required vasopressor, glucagon, intubation and mechanical ventilation 7/17-7/18.  Now stable. Currently she is involuntarily committed. Psychiatry is following closely. She continues to mention suicidal ideation. - IVC renewed 8/7 - Psychiatry continues following, attempting to reach out to outpatient team though it doesn't appear they currently have resources sufficient to help the patient at her current level of need.   Anxiety and Depression, Schizoaffective Disorder, Adjustment Disorder, bipolar disorder, fibromyalgia and auditory hallucination: Patient noted to be on Cogentin, Cymbalta, Neurontin, Zyprexa, prazosin. Psychiatry follows closely. She was on Lexapro prior to admission which has been changed over to Cymbalta. -Patient started hearing voices in her head that were "too much for her to handle" and then she threatened "that she was in a kill and strangle one of the staff members" -Intermittently has to be placed in 4-point restraints and given IM Zyprexa.  She still has a one-to-one sitter at bedside and will continue -Cymbalta has been increased from 30 mg to 60 mg daily and will be continuing olanzapine 10 mg p.o. daily nightly for psychosis and continuing gabapentin 600 mg 3 times daily for agitated mood/anxiety - Continues to have periodic episodes of agitation.  Would be in favor of prn ODT zyprexa during these episodes (last was 8/5). Await placement to inpatient psychiatric facility.   CAP Completed course of antibiotics.  Respiratory status is stable.  Essential hypertension  Normotensive. Was apparently on metoprolol prior to admission which is currently on  hold. Labs were ordered but patient continues to refuse.   Sinus Tachycardia -Resolved.   Metabolic Acidosis -Improved.  Noted to be on sodium bicarbonate.   Hypomagnesemia Monitor periodically.   Microcytic Anemia Continue ferrous sulfate.  Blood work was ordered but patient refuses.  Hemoglobin was stable previously.     History of Seizure Disorder -Stable.  Not on meds.   History of Asthma -Stable.   GERD/GI Prophylaxis -Continue PPI with Pantoprazole and Famotidine 20 mg po BID   Obesity -Complicates overall prognosis and care -Estimated body mass index is 36.03 kg/m as calculated from the following:   Height as of this encounter: 5\' 5"  (1.651 m).   Weight as of this encounter: 98.2 kg.  -Weight Loss and Dietary Counseling given  Tyrone Nine, MD [facility] [email or web address] 09/14/2022, 7:35 AM

## 2022-09-14 NOTE — Plan of Care (Addendum)
Soon into shift Pt attempted to repetitively bump back of head against wall, stating "the voices, the voices, I want to go to central, the voices", this nurse was able to calmly redirect patient to sit at bedside, no further issues this shift.  Problem: Clinical Measurements: Goal: Will remain free from infection Outcome: Progressing   Problem: Safety: Goal: Ability to remain free from injury will improve Outcome: Progressing

## 2022-09-15 DIAGNOSIS — F315 Bipolar disorder, current episode depressed, severe, with psychotic features: Secondary | ICD-10-CM | POA: Diagnosis not present

## 2022-09-15 MED ORDER — STERILE WATER FOR INJECTION IJ SOLN
INTRAMUSCULAR | Status: AC
  Filled 2022-09-15: qty 10

## 2022-09-15 NOTE — Progress Notes (Signed)
[facility] PROGRESS NOTE  [name] (DOB: [birth year]) BJY:[id number] PCP: [name]  Brief Narrative: 32 y.o. F with PMH of adjustment disorder with mixed anxiety and depressed mood, autism, bipolar 1 disorder, fragile x syndrome, seizures not on meds, schizoaffective disorder and multiple suicide attempts in the last year who presented to the ED after ingesting numerous medications in a suicide attempt. She reported taking #30 metoprolol 25mg  tablets, #60 Hydroxyzine 25mg , #7 Lexapro 20mg , #21 Gabapentin 600mg , #7 Flexeril 10mg , #7 protonix 40mg  and #7 Prazosin 1mg . She was slightly bradycardic and hypotensive on EMS arrival which improved with fluids. Poison control was contacted and as she was within 2hrs of ingestion was given activated charcoal. Placed under IVC in ED. Poison control also recommended ICU admission for close Qtc and electrolyte monitoring and possible pressors. At the time of admission, pt was somnolent but arousable to voice and following commands. She was intubated and started on vasopressor and glucagon. Also started on ceftriaxone for possible pneumonia. Patient was extubated on 7/18 and transferred to the floor on 7/19. She was transferred to [facility] service on 08/25/22. Psychiatry continues to follow. They recommend inpatient psychiatric hospitalization.   Subjective: Sitter reports uneventful night having not exited the bed, sleeping this morning with no complaints.   Objective: BP (!) 110/58 (BP Location: Right Arm)   Pulse 92   Temp 98.2 F (36.8 C) (Oral)   Resp 17   Ht 5\' 5"  (1.651 m)   Wt 98.2 kg   LMP 08/08/2022 (Approximate)   SpO2 97%   BMI 36.03 kg/m   31yo F in no distress, resting quietly, nonlabored/even breaths MAE  Assessment & Plan: Suicidal Attempt with intentional overdose:  Overdosed with multiple medications including #30 metoprolol 25mg  tablets, #60 Hydroxyzine 25mg , #7 Lexapro 20mg , #21 Gabapentin 600mg , #7  Flexeril 10mg , #7 protonix 40mg  and #7 Prazosin 1mg .  Poison control consulted. Received charcoal. Denies ETOH or illicit drug use.  Extended UDS negative. Required vasopressor, glucagon, intubation and mechanical ventilation 7/17-7/18.  Now stable. Currently she is involuntarily committed. Psychiatry is following closely. She continues to mention suicidal ideation. - IVC renewed 8/7 - Psychiatry continues following, continue plan for inpatient psychiatric placement at [facility].   Anxiety and Depression, Schizoaffective Disorder, Adjustment Disorder, bipolar disorder, fibromyalgia and auditory hallucination: Patient noted to be on Cogentin, Cymbalta, Neurontin, Zyprexa, prazosin. Psychiatry follows closely. She was on Lexapro prior to admission which has been changed over to Cymbalta. -Patient started hearing voices in her head that were "too much for her to handle" and then she threatened "that she was in a kill and strangle one of the staff members" -Intermittently has to be placed in 4-point restraints and given IM Zyprexa.  She still has a one-to-one sitter at bedside and will continue -Cymbalta has been increased from 30 mg to 60 mg daily and will be continuing olanzapine 10 mg p.o. daily nightly for psychosis and continuing gabapentin 600 mg 3 times daily for agitated mood/anxiety - Continues to have periodic episodes of agitation.  Would be in favor of prn ODT zyprexa during these episodes (last was 8/5). Await placement to inpatient psychiatric facility.   CAP Completed course of antibiotics.  Respiratory status is stable.   Essential hypertension  Normotensive. Was apparently on metoprolol prior to admission which is currently on hold. Labs were ordered but patient continues to refuse.   Sinus Tachycardia -Resolved.   Metabolic Acidosis -Improved.  Noted to be on sodium bicarbonate.   Hypomagnesemia Monitor  periodically.   Microcytic Anemia Continue ferrous sulfate.  Blood work  was ordered but patient refuses.  Hemoglobin was stable previously.     History of Seizure Disorder -Stable.  Not on meds.   History of Asthma -Stable.   GERD/GI Prophylaxis -Continue PPI with Pantoprazole and Famotidine 20 mg po BID   Obesity -Complicates overall prognosis and care -Estimated body mass index is 36.03 kg/m as calculated from the following:   Height as of this encounter: 5\' 5"  (1.651 m).   Weight as of this encounter: 98.2 kg.  -Weight Loss and Dietary Counseling given  Tyrone Nine, MD [facility] [email or web address] 09/15/2022, 7:07 AM

## 2022-09-15 NOTE — Plan of Care (Signed)
  Problem: Health Behavior/Discharge Planning: Goal: Ability to manage health-related needs will improve Outcome: Progressing   Problem: Activity: Goal: Risk for activity intolerance will decrease Outcome: Progressing   Problem: Coping: Goal: Level of anxiety will decrease Outcome: Progressing   

## 2022-09-15 NOTE — Plan of Care (Signed)
  Problem: Clinical Measurements: Goal: Diagnostic test results will improve Outcome: Progressing   Problem: Activity: Goal: Risk for activity intolerance will decrease Outcome: Progressing   Problem: Coping: Goal: Ability to adjust to condition or change in health will improve Outcome: Progressing
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
## 2022-09-16 DIAGNOSIS — T50902A Poisoning by unspecified drugs, medicaments and biological substances, intentional self-harm, initial encounter: Secondary | ICD-10-CM | POA: Diagnosis not present

## 2022-09-16 DIAGNOSIS — F411 Generalized anxiety disorder: Secondary | ICD-10-CM | POA: Diagnosis not present

## 2022-09-16 DIAGNOSIS — J4520 Mild intermittent asthma, uncomplicated: Secondary | ICD-10-CM | POA: Diagnosis not present

## 2022-09-16 DIAGNOSIS — F315 Bipolar disorder, current episode depressed, severe, with psychotic features: Secondary | ICD-10-CM | POA: Diagnosis not present

## 2022-09-16 NOTE — TOC Progression Note (Addendum)
Transition of Care [facility]) - Progression Note    Patient Details  Name: [name] MRN: [id number] Date of Birth: 04-11-1990  Transition of Care [facility]) CM/SW Contact  Donnalee Curry, LCSWA Phone Number: 09/16/2022, 9:40 AM  Clinical Narrative:     SW received VM on 09/13/22 from [facility] [phone]) requested updated notes faxed to: [phone]. SW completed.        Expected Discharge Plan and Services                                               Social Determinants of Health (SDOH) Interventions SDOH Screenings   Food Insecurity: No Food Insecurity (07/29/2022)  Housing: Low Risk  (07/29/2022)  Transportation Needs: No Transportation Needs (07/29/2022)  Recent Concern: Transportation Needs - Unmet Transportation Needs (05/06/2022)  Utilities: Not At Risk (07/29/2022)  Alcohol Screen: Low Risk  (07/29/2022)  Depression (PHQ2-9): Medium Risk (03/10/2022)  Tobacco Use: Medium Risk (09/07/2022)    Readmission Risk Interventions    06/06/2022    3:38 PM  Readmission Risk Prevention Plan  Transportation Screening Complete  Medication Review (RN Care Manager) Complete  PCP or Specialist appointment within 3-5 days of discharge Complete  HRI or Home Care Consult Complete  SW Recovery Care/Counseling Consult Complete  Palliative Care Screening Not Applicable  Skilled Nursing Facility Not Applicable
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
## 2022-09-16 NOTE — Plan of Care (Signed)
?  Problem: Clinical Measurements: ?Goal: Ability to maintain clinical measurements within normal limits will improve ?Outcome: Progressing ?Goal: Will remain free from infection ?Outcome: Progressing ?Goal: Diagnostic test results will improve ?Outcome: Progressing ?  ?

## 2022-09-17 DIAGNOSIS — F315 Bipolar disorder, current episode depressed, severe, with psychotic features: Secondary | ICD-10-CM | POA: Diagnosis not present

## 2022-09-17 DIAGNOSIS — F411 Generalized anxiety disorder: Secondary | ICD-10-CM | POA: Diagnosis not present

## 2022-09-17 DIAGNOSIS — T50902A Poisoning by unspecified drugs, medicaments and biological substances, intentional self-harm, initial encounter: Secondary | ICD-10-CM | POA: Diagnosis not present

## 2022-09-17 DIAGNOSIS — J4520 Mild intermittent asthma, uncomplicated: Secondary | ICD-10-CM | POA: Diagnosis not present

## 2022-09-17 NOTE — TOC Progression Note (Signed)
Transition of Care [facility]) - Progression Note    Patient Details  Name: [name] MRN: [id number] Date of Birth: 03-Mar-1990  [facility]) CM/SW Contact  Lorri Frederick, LCSW Phone Number: 09/17/2022, 4:06 PM  Clinical Narrative:   Referral for inpt psych faxed to [facility] and [facility].          Expected Discharge Plan and Services                                               Social Determinants of Health (SDOH) Interventions SDOH Screenings   Food Insecurity: No Food Insecurity (07/29/2022)  Housing: Low Risk  (07/29/2022)  Transportation Needs: No Transportation Needs (07/29/2022)  Recent Concern: Transportation Needs - Unmet Transportation Needs (05/06/2022)  Utilities: Not At Risk (07/29/2022)  Alcohol Screen: Low Risk  (07/29/2022)  Depression (PHQ2-9): Medium Risk (03/10/2022)  Tobacco Use: Medium Risk (09/07/2022)    Readmission Risk Interventions    06/06/2022    3:38 PM  Readmission Risk Prevention Plan  Transportation Screening Complete  Medication Review (RN Care Manager) Complete  PCP or Specialist appointment within 3-5 days of discharge Complete  HRI or Home Care Consult Complete  SW Recovery Care/Counseling Consult Complete  Palliative Care Screening Not Applicable  Skilled Nursing Facility Not Applicable

## 2022-09-17 NOTE — Progress Notes (Signed)
PROGRESS NOTE    [name]  LKG:[id number] DOB: [birth year] DOA: 08/22/2022 PCP: [name]    Brief Narrative:  32 y.o. female with PMH of adjustment disorder with mixed anxiety and depressed mood, autism, bipolar 1 disorder, fragile x syndrome, seizures not on meds, schizoaffective disorder and multiple suicide attempts in the last year  presented to the ED after ingesting numerous medications in a suicide attempt. She reported taking #30 metoprolol 25mg  tablets, #60 Hydroxyzine 25mg , #7 Lexapro 20mg , #21 Gabapentin 600mg , #7 Flexeril 10mg , #7 protonix 40mg  and #7 Prazosin 1mg . She was slightly bradycardic and hypotensive on EMS arrival which improved with fluids. Poison control was contacted and as she was within 2hrs of ingestion was given activated charcoal.  Patient was placed under IVC in ED. Poison control also recommended ICU admission for close Qtc and electrolyte monitoring and possible pressors.  She was intubated and started on vasopressor and glucagon. Also started on ceftriaxone for possible pneumonia. Patient was extubated on 7/18 and transferred to the floor on 7/19. She was transferred to [facility] service on 08/25/22. Psychiatry have recommended inpatient psychiatric hospitalization.  Pending placement.  Assessment and Plan:  Suicidal Attempt with intentional overdose:  -Multiple medications including #30 metoprolol 25mg  tablets, #60 Hydroxyzine 25mg , #7 Lexapro 20mg , #21 Gabapentin 600mg , #7 Flexeril 10mg , #7 protonix 40mg  and #7 Prazosin 1mg .  Poison control for the patient and patient was recommended charcoal.  Initially required vasopressor glucagon intubation and mechanical ventilation.  Currently on IVC. Psychiatry placed APS referral as well.  Further care as per psychiatry.  On restraints and IM Zyprexa intermittently.  Cymbalta dose has been increased by psychiatry.   Anxiety and Depression, Schizoaffective Disorder, Adjustment Disorder, bipolar disorder,  fibromyalgia and auditory hallucination:  Improved hallucination. On Cogentin, Zyprexa, Atarax, prazosin and Lexapro at home.  Lexapro changed to Cymbalta. Zyprexa as needed and psychiatry recommending olanzapine 10 mg IM daily single dose as needed.  Intermittently has to be placed in 4-point restraints and given IM Zyprexa.  Continue one-to-one sitter at bedside. .  Continue gabapentin 600 mg 3 times daily for agitated mood/anxiety.  Reported that the patient was awake, overnight.    CAP Had significantly elevated procalcitonin.  Has completed course of p.o. biotics.  Initially with Rocephin followed by Augmentin.  HTN Continue Lopressor.   Sinus Tachycardia Continue Lopressor.  Improved.  Metabolic Acidosis Improved.   Hypomagnesemia Improved.  Microcytic Anemia Iron deficiency anemia.  Low ferritin.  Continue oral iron.  History of Seizure Disorder -Stable.  Not on meds.   History of Asthma Continue nebulizers.  Stable.   Dysuria Was on Foley catheter during ICU.  Was recently treated with antibiotic.   Hypoalbuminemia Encourage oral intake.  Obesity Body mass index is 36.03 kg/m.  Would benefit from weight loss as outpatient.     DVT prophylaxis: SCDs Start: 08/22/22 1514   Code Status:     Code Status: Full Code  Disposition: Inpatient psych unit, medically stable for disposition  Status is: Inpatient  Remains inpatient appropriate because: IVC, need for inpatient psych admission   Family Communication: None at bedside  Consultants:  Psychiatry  Procedures:  None  Antimicrobials:  None   Subjective: Today, patient was seen and examined at bedside.  One-to-one sitter at bedside reported that the patient was calm overnight.  Patient denies any pain, nausea, vomiting, fever, chills or rigor.  Objective: Vitals:   09/16/22 2027 09/17/22 0408 09/17/22 0813 09/17/22 0841  BP:  127/74 117/63  Pulse:  90 95   Resp:  20 18   Temp:  98.4 F (36.9  C) 98.3 F (36.8 C)   TempSrc:  Oral Oral   SpO2: 97% 98% 96% 97%  Weight:      Height:        Intake/Output Summary (Last 24 hours) at 09/17/2022 1331 Last data filed at 09/17/2022 0735 Gross per 24 hour  Intake 1560 ml  Output 0 ml  Net 1560 ml   Filed Weights   09/01/22 0300 09/02/22 0500 09/03/22 0402  Weight: 98.8 kg 99 kg 98.2 kg    Physical Examination: Body mass index is 36.03 kg/m.   General: Obese built, not in obvious distress HENT:   No scleral pallor or icterus noted. Oral mucosa is moist.  Chest: Diminished breath sounds bilaterally. No crackles or wheezes.  CVS: S1 &S2 heard. No murmur.  Regular rate and rhythm. Abdomen: Soft, nontender, nondistended.  Bowel sounds are heard.   Extremities: No cyanosis, clubbing or edema.  Peripheral pulses are palpable. Psych: Alert, awake and mildly somnolent. CNS:  No cranial nerve deficits.  Moves all extremities. Skin: Warm and dry.  No rashes noted.  Data Reviewed:   CBC: No results for input(s): "WBC", "NEUTROABS", "HGB", "HCT", "MCV", "PLT" in the last 168 hours.  Basic Metabolic Panel: No results for input(s): "NA", "K", "CL", "CO2", "GLUCOSE", "BUN", "CREATININE", "CALCIUM", "MG", "PHOS" in the last 168 hours.  Liver Function Tests: No results for input(s): "AST", "ALT", "ALKPHOS", "BILITOT", "PROT", "ALBUMIN" in the last 168 hours.   Radiology Studies: No results found.    LOS: 26 days    Joycelyn Das, MD [facility] Available via Epic secure chat 7am-7pm After these hours, please refer to coverage provider listed on [email or web address] 09/17/2022, 1:31 PM

## 2022-09-17 NOTE — Progress Notes (Signed)
At 0050 Phlebotomy came to do lab work.  Patient refused at this time.  Phlebotomy came back again this morning at 0620 and patient educated and refused again.

## 2022-09-17 NOTE — Consult Note (Signed)
[name] Psychiatry Consult Evaluation  Service Date: September 17, 2022 LOS:  LOS: 26 days    Primary Psychiatric Diagnoses  Schizoaffective disorder, bipolar type  GAD Borderline personality disorder  Assessment  [name] is a 32 y.o. female with a past psychiatric history of borderline personality disorder, schizoaffective disorder bipolar type, and PTSD.  She has had numerous behavioral health admissions and an extensive history of suicide attempts.  Patient presents to [facility] ED for an attempted overdose on her prescription medications.  Briefly, this pt has had ~50 ED visits or psych hospitalizations during 2024; this continues a pattern of an inability to maintain safity in the outpatient setting. The vast majority of her suicide attempts have been via overdose on psychotropic medication. She has failed multiple levels of outpt management up to and including CST; she has been declined my multiple psychiatric facilities due to futility, and she has been discharged by her CST team as of 8/8. She is currently on the [facility] priority wait list. She is not eligible for ACT services through [facility] due to BPD diagnosis.   We are trying to simplify patient's medication regimen to avoid polypharmacy especially in the setting of her recent overdose. Earlier in hospitalization during a period of improvement (roughly 8/1 to 8/8), we discussed medication management if and when she does return home including closer monitoring, less access to her medications through a locked cabinet, and her ex-boyfriend being more in charge of her medications in which was confirmed with her ex-boyfriend.  Patient has an extensive history of ER visits and hospitalizations in which she has displayed aggression, psychosis, and danger to self while in proximity of a Recruitment consultant. Patient also shows high level of lethality, intensity, and frequency of suicidal attempts with multiple inpatient psychiatric admissions  with little benefit so we continue to recommend [facility]. Patient is currently on the [facility] waitlist and we are working closely with LSW with bed availability. She was informed on 8/8 that she has been dropped by her CST; this caused acute worsening of reported hallucinations, suicidality, homicidality and agitation (fear of abandonment is central feature in BPD) and consideration of community f/u was terminated.   Today she denied SI and HI and was focused on discharge thorough interview. She minimized recent behavioral escalation.    Diagnoses:  Active Hospital problems: Principal Problem:   Bipolar I disorder, current or most recent episode depressed, with psychotic features (HCC) Active Problems:   Asthma   GAD (generalized anxiety disorder)   Suicide attempt (HCC)   Overdose   Drug overdose    Plan   ## Psychiatric Medication Recommendations:  -- Continue Zyprexa 20mg  po nightly for psychosis -- Continue Cymbalta 60 mg daily (this has been increased)  -- Continue Gabapentin 600 mg TID for agitated mood/anxiety -- c prazosin 1 mg at bedtime PTSD Agitation Protocol -- PRN Zyprexa 10mg  IM for agitation -- PRN Ativan 0.5 mg oral BID PRN for agitation -- PRN Zyprexa zydis 5 mg BID for agitation  ## Medical Decision Making Capacity:   Capacity was not formally addressed during this encounter  ## Further Work-up:  -- Per primary -- most recent EKG on 08/30/2022 had QtC of 469  ## Disposition:  -- We recommend inpatient psychiatric hospitalization after medical hospitalization. Patient has been involuntarily committed on 09/04/2022.  -- IVC renewed on 8/7 -- Made several attempts to contact CST team leading into being notified that she was dropped with no answer   ## Behavioral /  Environmental:  --  To minimize splitting of staff, assign one staff person to communicate all information from the team when feasible. -- Apply the established behavioral rules and protocols consistently  throughout the night (see nursing care order for details). Ensure that all staff members are aware of and adhere to the same guidelines to avoid confusion and mixed messages.   ## Safety and Observation Level:  - Based on my clinical evaluation, I estimate the patient to be at elevated risk of self harm in the current setting - At this time, we recommend continuing a 1: 1 level of observation. This decision is based on my review of the chart including patient's history and current presentation, interview of the patient, mental status examination, and consideration of suicide risk including evaluating suicidal ideation, plan, intent, suicidal or self-harm behaviors, risk factors, and protective factors. This judgment is based on our ability to directly address suicide risk, implement suicide prevention strategies and develop a safety plan while the patient is in the clinical setting. Please contact our team if there is a concern that risk level has changed.  Suicide risk assessment  Patient has following modifiable risk factors for suicide: active suicidal ideation, under treated depression , recklessness, and lack of access to outpatient mental health resources, which we are addressing by continuing adequate safety precautions while she is hospitalized and adjusting medications   Patient has following non-modifiable or demographic risk factors for suicide: early widowhood, history of suicide attempt, history of self harm behavior, and psychiatric hospitalization  Patient has the following protective factors against suicide: Supportive friends  Thank you for this consult request. Recommendations have been communicated to the primary team.  We will follow at this time.   Kizzie Ide, MD PGY-2 Psychiatry   Psychiatric and Social History   Relevant Aspects of Hospital Course:  Admitted on 08/22/2022 for suicide attempt by overdose on her prescription medication.   Patient Report:  [name]  alone this AM. Seen lying in bed. She was focused on discharge throughout conversation, stating "I need an ACT team and then I can go". Had an allegedly good visit with ex boyfriend this weekend "he wants to marry me" (this has not been verified). Minimized behavior over the weekend and did not engage in discussion of how her behaviors are communicating ongoing psychiatric instability. Does not appear to have gotten out of bed despite numerous attempts to encourage non-pharmacological approaaches to mood. Has no desire for termination session with CST.   Collateral 8/12: tried to call Fayrene Fearing with no response.   8/9 Called Debra with CST - stated she would be happy to conduct a termination session with Natoria if she reached out . She has not been called by [name] during this hospitalization.   8/9 Called Dr. Pershing Proud with [facility] to discuss pt's recent decompensation   Sample nursing notes illustrating behavior over the weekend:  8/10 PM - Illene Regulus RN  At shift change patient became very agitated and violent Civil engineer, contracting.  She started banging the cabinet in room.  Opening and slamming cabinet.  Then she exited the room half naked and destroyed the hand sanitizer.  She removed batteries from the sanitizer and threw them at the sitter.   Security was called and an order of Zyprexa IM was requested by day nurse.   By the time security came patient was calmer back in bed.   Only gave the scheduled PO Zyprexa that patient took without incident.  [employer] nto note above   Past Psychiatric History: Previous Psych Diagnoses: Schizoaffective disorder bipolar type, Anxiety, Autism spectrum disorder, PTSD, Borderline personality disorder Prior inpatient treatment: multiple, 15 ED visits and 4 inpatient admissions since 06/2022 (~50 total in 2024), admitted to [facility] from 07/29/22-08/02/2022, has been to [facility] for eight months in 2016, and  was discharged from inpatient facility past week Current/prior outpatient treatment: [facility], CST team twice a week Prior rehab hx: denies multiple Psychotherapy hx: [facility] History of suicide: 20-30 times by overdosing on pills, cutting or drinking 409 disinfectant History of homicide or aggression: denies Psychiatric medication history:  Previously trialed Tegretol, Haldol, Clozaril, Depakote, Invega, Lamictal, Seroquel, asenapine, abilify, vraylar (worsened suicidal thoughts), Lexapro, Zoloft  Neuromodulation history: denies  Social History:  Marital status: Widowed Children: 73 year old daughter Lives: with ex-boyfriend Fayrene Fearing, in two story home Employment: Disability, 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  Access to weapons: None   Exam Findings   Psychiatric Specialty Exam:  Presentation  General Appearance: Appropriate for Environment; Casual  Eye Contact:Poor  Speech:-- (rapid)  Speech Volume:Normal  Handedness:Right   Mood and Affect  Mood:-- "Okay"  Affect:-- congruent, restrictive   Thought Process  Thought Processes:Goal Directed  Descriptions of Associations:Intact  Orientation:Full (Time, Place and Person)  Thought Content:Logical  History of Schizophrenia/Schizoaffective disorder: Yes  Duration of Psychotic Symptoms:Greater than six months  Hallucinations: Occasionally hearing her parents' voice  Ideas of Reference:Paranoia  Suicidal Thoughts: Denies  Homicidal Thoughts: Denies   Sensorium  Memory:Immediate Good; Recent Good; Remote Good  Judgment:Poor  Insight: Fair   Art therapist  Concentration:Fair  Attention Span:Fair  Recall:Fair  Fund of Knowledge:Fair  Language:Fair   Psychomotor Activity  Psychomotor Activity: None   Assets  Assets:Desire for Improvement; Communication Skills   Sleep  Sleep: Fair    Physical Exam: Vital signs:  Temp:  [97.9  F (36.6 C)-98.4 F (36.9 C)] 98.3 F (36.8 C) (08/12 0813) Pulse Rate:  [90-110] 95 (08/12 0813) Resp:  [18-20] 18 (08/12 0813) BP: (117-127)/(63-74) 117/63 (08/12 0813) SpO2:  [96 %-98 %] 97 % (08/12 0841) Physical Exam Vitals and nursing note reviewed.  Constitutional:      General: She is not in acute distress.    Appearance: She is not ill-appearing.  Pulmonary:     Effort: Pulmonary effort is normal. No respiratory distress.  Skin:    General: Skin is warm and dry.  Neurological:     General: No focal deficit present.     Mental Status: She is alert.  Psychiatric:        Attention and Perception: Attention normal.        Speech: Speech normal.        Behavior: Behavior is uncooperative.     Blood pressure 117/63, pulse 95, temperature 98.3 F (36.8 C), temperature source Oral, resp. rate 18, height 5\' 5"  (1.651 m), weight 98.2 kg, last menstrual period 08/08/2022, SpO2 97%. Body mass index is 36.03 kg/m.   Other History   These have been pulled in through the EMR, reviewed, and updated if appropriate.   Family History:  The patient's family history includes Asthma in her father; Mental illness in her father; PDD in her brother; Seizures in her brother.  Medical History: Past Medical History:  Diagnosis Date   Acid reflux    Adjustment disorder with mixed anxiety and depressed mood 01/31/2022   Adjustment disorder with  mixed disturbance of emotions and conduct 08/03/2019   Anxiety    Asthma    last attack 03/13/15 or 03/14/15   Autism    Bipolar 1 disorder, depressed, severe (HCC) 07/25/2021   Carrier of fragile X syndrome    Chronic constipation    Depression    Drug-seeking behavior    Essential tremor    Headache    Ineffective individual coping 05/16/2022   Insomnia 01/12/2022   Intentional drug overdose (HCC) 06/05/2022   Neuromuscular disorder (HCC)    Normocytic anemia 06/05/2022   Overdose 07/22/2017   Overdose of acetaminophen 07/2017   and  other meds   Overdose, intentional self-harm, initial encounter (HCC) 07/20/2021   Paranoia (HCC) 04/22/2021   Personality disorder (HCC)    Purposeful non-suicidal drug ingestion (HCC) 06/27/2021   Schizo-affective psychosis (HCC)    Schizoaffective disorder (HCC) 07/29/2022   Schizoaffective disorder, bipolar type (HCC)    Seizures (HCC)    Last seizure December 2017   Skin erythema 04/27/2022   Sleep apnea    Suicidal behavior 07/25/2021   Suicidal ideation    Suicide (HCC) 07/01/2021   Suicide attempt (HCC) 07/04/2021    Surgical History: Past Surgical History:  Procedure Laterality Date   MOUTH SURGERY  2009 or 2010    Medications:   Current Facility-Administered Medications:    0.9 %  sodium chloride infusion, 250 mL, Intravenous, Continuous, Gonfa, Taye T, MD, Stopped at 08/24/22 1347   albuterol (PROVENTIL) (2.5 MG/3ML) 0.083% nebulizer solution 3 mL, 3 mL, Inhalation, Q6H PRN, Alanda Slim, Taye T, MD   cyclobenzaprine (FLEXERIL) tablet 10 mg, 10 mg, Oral, QHS, Gonfa, Taye T, MD, 10 mg at 09/16/22 2136   diclofenac Sodium (VOLTAREN) 1 % topical gel 2 g, 2 g, Topical, QID, Gonfa, Taye T, MD, 2 g at 09/17/22 1459   DULoxetine (CYMBALTA) DR capsule 60 mg, 60 mg, Oral, Daily, Akintayo, Mojeed, MD, 60 mg at 09/17/22 0954   ferrous sulfate tablet 325 mg, 325 mg, Oral, BID WC, Gonfa, Taye T, MD, 325 mg at 09/17/22 0955   gabapentin (NEURONTIN) capsule 600 mg, 600 mg, Oral, TID, Alanda Slim, Taye T, MD, 600 mg at 09/17/22 1456   hydrOXYzine (ATARAX) tablet 25 mg, 25 mg, Oral, TID PRN, Alanda Slim, Taye T, MD, 25 mg at 09/17/22 1309   ibuprofen (ADVIL) tablet 600 mg, 600 mg, Oral, TID PRN, Alanda Slim, Taye T, MD, 600 mg at 09/17/22 1137   LORazepam (ATIVAN) tablet 0.5 mg, 0.5 mg, Oral, BID PRN, Alanda Slim, Taye T, MD, 0.5 mg at 09/17/22 1308   mometasone-formoterol (DULERA) 200-5 MCG/ACT inhaler 2 puff, 2 puff, Inhalation, BID, Alanda Slim, Taye T, MD, 2 puff at 09/17/22 0840   mupirocin cream (BACTROBAN) 2 %, ,  Topical, Daily, Marguerita Merles [place], DO, Given at 09/16/22 0847   OLANZapine (ZYPREXA) injection 10 mg, 10 mg, Intramuscular, Once PRN, Akintayo, Mojeed, MD   OLANZapine (ZYPREXA) tablet 20 mg, 20 mg, Oral, Daily, Mariel Craft, MD, 20 mg at 09/16/22 2136   OLANZapine zydis (ZYPREXA) disintegrating tablet 5 mg, 5 mg, Oral, BID PRN, Mariel Craft, MD, 5 mg at 09/12/22 2135   ondansetron (ZOFRAN-ODT) disintegrating tablet 4 mg, 4 mg, Oral, Q8H PRN, Sheikh, Omair Latif, DO, 4 mg at 09/15/22 1723   Oral care mouth rinse, 15 mL, Mouth Rinse, PRN, Osvaldo Shipper, MD   pantoprazole (PROTONIX) EC tablet 40 mg, 40 mg, Oral, QAC breakfast, Alanda Slim, Taye T, MD, 40 mg at 09/17/22 0956   polyethylene glycol (MIRALAX /  GLYCOLAX) packet 17 g, 17 g, Oral, BID PRN, Alanda Slim, Taye T, MD, 17 g at 09/16/22 2136   prazosin (MINIPRESS) capsule 1 mg, 1 mg, Oral, QHS, Gonfa, Taye T, MD, 1 mg at 09/16/22 2135   senna-docusate (Senokot-S) tablet 1 tablet, 1 tablet, Oral, BID PRN, Candelaria Stagers T, MD, 1 tablet at 09/16/22 2136   sodium bicarbonate tablet 650 mg, 650 mg, Oral, TID, Candelaria Stagers T, MD, 650 mg at 09/17/22 0956  Allergies: Allergies  Allergen Reactions   Bee Venom Anaphylaxis   Coconut Flavor Anaphylaxis and Rash   Fish Allergy Anaphylaxis   Geodon [Ziprasidone Hcl] Other (See Comments)    Pt states that this medication causes paralysis of the mouth.     Haloperidol And Related Other (See Comments)    Pt states that this medication causes paralysis of the mouth, jaw locks up   Lithobid [Lithium] Other (See Comments)    Seizure-like activity    Roxicodone [Oxycodone] Other (See Comments)    Hallucinations    Seroquel [Quetiapine] Other (See Comments)    Severe drowsiness - Pt currently taking 12.5 mg BID, she is ok with this dose   Shellfish Allergy Anaphylaxis   Phenergan [Promethazine Hcl] Other (See Comments)    Chest pain     Prilosec [Omeprazole] Nausea And Vomiting and Other (See Comments)     Pt can take protonix with no problems    Sulfa Antibiotics Other (See Comments)    Chest pain    Tegretol [Carbamazepine] Nausea And Vomiting   Prozac [Fluoxetine] Other (See Comments)    Increased Depression and Suicidal thoughts   Tape Other (See Comments)    Skin tears, can only tolerate paper tape.   Tylenol [Acetaminophen] Rash and Other (See Comments)    Rash on face

## 2022-09-18 DIAGNOSIS — T50902A Poisoning by unspecified drugs, medicaments and biological substances, intentional self-harm, initial encounter: Secondary | ICD-10-CM | POA: Diagnosis not present

## 2022-09-18 DIAGNOSIS — J4520 Mild intermittent asthma, uncomplicated: Secondary | ICD-10-CM | POA: Diagnosis not present

## 2022-09-18 DIAGNOSIS — F315 Bipolar disorder, current episode depressed, severe, with psychotic features: Secondary | ICD-10-CM | POA: Diagnosis not present

## 2022-09-18 DIAGNOSIS — F411 Generalized anxiety disorder: Secondary | ICD-10-CM | POA: Diagnosis not present

## 2022-09-18 MED ORDER — OLANZAPINE 10 MG PO TABS
15.0000 mg | ORAL_TABLET | Freq: Every day | ORAL | Status: DC
  Administered 2022-09-18 – 2022-10-18 (×30): 15 mg via ORAL
  Filled 2022-09-18 (×32): qty 2

## 2022-09-18 NOTE — Progress Notes (Signed)
Pt refused labs this morning. Labs stated they will return at a later time. Pt cooperative and pleasant overnight. Had shower and rested well.

## 2022-09-18 NOTE — Progress Notes (Signed)
PROGRESS NOTE    [name]  QMV:[id number] DOB: 1990/12/11 DOA: 08/22/2022 PCP: [name]    Brief Narrative:   32 y.o. female with PMH of adjustment disorder with mixed anxiety and depressed mood, autism, bipolar 1 disorder, fragile x syndrome, seizures not on meds, schizoaffective disorder and multiple suicide attempts in the last year  presented to the ED after ingesting numerous medications in a suicide attempt. She reported taking #30 metoprolol 25mg  tablets, #60 Hydroxyzine 25mg , #7 Lexapro 20mg , #21 Gabapentin 600mg , #7 Flexeril 10mg , #7 protonix 40mg  and #7 Prazosin 1mg . She was slightly bradycardic and hypotensive on EMS arrival which improved with fluids. Poison control was contacted and as she was within 2hrs of ingestion was given activated charcoal.  Patient was placed under IVC in ED. Poison control also recommended ICU admission for close Qtc and electrolyte monitoring and possible pressors.  She was intubated and started on vasopressor and glucagon. Also started on ceftriaxone for possible pneumonia. Patient was extubated on 7/18 and transferred to the floor on 7/19. She was transferred to [facility] service on 08/25/22. Psychiatry have recommended inpatient psychiatric hospitalization.  Pending placement.  Assessment and Plan:  Suicidal Attempt with intentional overdose:  -Multiple medications including #30 metoprolol 25mg  tablets, #60 Hydroxyzine 25mg , #7 Lexapro 20mg , #21 Gabapentin 600mg , #7 Flexeril 10mg , #7 protonix 40mg  and #7 Prazosin 1mg .  Poison control for the patient and patient was recommended charcoal.  Initially required vasopressor glucagon intubation and mechanical ventilation.  Currently on IVC. Psychiatry placed APS referral as well.  Further care as per psychiatry.  On restraints and IM Zyprexa intermittently.     Anxiety and Depression, Schizoaffective Disorder, Adjustment Disorder, bipolar disorder, fibromyalgia and auditory hallucination:   Improved hallucination. On Cogentin, Zyprexa, Atarax, prazosin and Lexapro at home.  Lexapro changed to Cymbalta. Zyprexa as needed and psychiatry recommending olanzapine 10 mg IM daily single dose as needed.  Intermittently had to be placed in 4-point restraints and given IM Zyprexa.   Continue gabapentin 600 mg 3 times daily for agitated mood/anxiety. Continue one-to-one sitter at bedside.  Currently has remained calm and cooperative.    CAP Resolved.  Had significantly elevated procalcitonin.  Has completed course of p.o. biotics.  Initially with Rocephin followed by Augmentin.  HTN Stable.  Continue Lopressor.   Sinus Tachycardia Continue Lopressor.  Improved.  Metabolic Acidosis Improved.   Hypomagnesemia Improved.  No recent lab.  Patient has refused.  Microcytic Anemia Iron deficiency anemia.  Low ferritin.  Continue oral iron.  History of Seizure Disorder -Stable.  Not on meds.   History of Asthma Continue nebulizers.  Stable.   Dysuria Was on Foley catheter during ICU.  Was recently treated with antibiotic.   Hypoalbuminemia Encourage oral intake.  Obesity Body mass index is 36.03 kg/m.  Would benefit from weight loss as outpatient.     DVT prophylaxis: SCDs Start: 08/22/22 1514   Code Status:     Code Status: Full Code  Disposition: Inpatient psych unit, medically stable for disposition  Status is: Inpatient  Remains inpatient appropriate because: IVC, need for inpatient psych admission   Family Communication: None at bedside  Consultants:  Psychiatry  Procedures:  None  Antimicrobials:  None   Subjective: Today, patient was seen and examined at bedside.  Denies interval complaints.  Denies any nausea vomiting fever chills shortness of breath or abdominal pain.    Objective: Vitals:   09/17/22 1928 09/17/22 2000 09/18/22 0555 09/18/22 0849  BP: 131/76  Marland Kitchen)  113/90   Pulse: (!) 120 95 94   Resp: 19  19   Temp: 98.5 F (36.9 C)  97.6 F  (36.4 C)   TempSrc: Oral  Oral   SpO2: 99%  99% 99%  Weight:      Height:        Intake/Output Summary (Last 24 hours) at 09/18/2022 1332 Last data filed at 09/18/2022 0900 Gross per 24 hour  Intake 928 ml  Output 0 ml  Net 928 ml   Filed Weights   09/01/22 0300 09/02/22 0500 09/03/22 0402  Weight: 98.8 kg 99 kg 98.2 kg    Physical Examination: Body mass index is 36.03 kg/m.   General: Obese built, not in obvious distress HENT:   No scleral pallor or icterus noted. Oral mucosa is moist.  Chest: Diminished breath sounds bilaterally. No crackles or wheezes.  CVS: S1 &S2 heard. No murmur.  Regular rate and rhythm. Abdomen: Soft, nontender, nondistended.  Bowel sounds are heard.   Extremities: No cyanosis, clubbing or edema.  Peripheral pulses are palpable. Psych: Alert, awake and mildly somnolent. CNS:  No cranial nerve deficits.  Moves all extremities. Skin: Warm and dry.  No rashes noted.  Data Reviewed:   CBC: No results for input(s): "WBC", "NEUTROABS", "HGB", "HCT", "MCV", "PLT" in the last 168 hours.  Basic Metabolic Panel: No results for input(s): "NA", "K", "CL", "CO2", "GLUCOSE", "BUN", "CREATININE", "CALCIUM", "MG", "PHOS" in the last 168 hours.  Liver Function Tests: No results for input(s): "AST", "ALT", "ALKPHOS", "BILITOT", "PROT", "ALBUMIN" in the last 168 hours.   Radiology Studies: No results found.    LOS: 27 days    Joycelyn Das, MD [facility] Available via Epic secure chat 7am-7pm After these hours, please refer to coverage provider listed on [email or web address] 09/18/2022, 1:32 PM

## 2022-09-18 NOTE — Consult Note (Signed)
[name] Psychiatry Consult Evaluation  Service Date: September 18, 2022 LOS:  LOS: 27 days    Primary Psychiatric Diagnoses  Schizoaffective disorder, bipolar type  GAD Borderline personality disorder  Assessment  [name] is a 32 y.o. female with a past psychiatric history of borderline personality disorder, schizoaffective disorder bipolar type, and PTSD.  She has had numerous behavioral health admissions and an extensive history of suicide attempts.  Patient presents to [facility] ED for an attempted overdose on her prescription medications.  Briefly, this pt has had ~50 ED visits or psych hospitalizations during 2024; this continues a pattern of an inability to maintain safity in the outpatient setting. The vast majority of her suicide attempts have been via overdose on psychotropic medication. She has failed multiple levels of outpt management up to and including CST; she has been declined my multiple psychiatric facilities due to futility, and she has been discharged by her CST team as of 8/8. She is currently on the [facility] priority wait list. She is not eligible for ACT services through [facility] due to BPD diagnosis.   We are trying to simplify patient's medication regimen to avoid polypharmacy especially in the setting of her recent overdose. Earlier in hospitalization during a period of improvement (roughly 8/1 to 8/8), we discussed medication management if and when she does return home including closer monitoring, less access to her medications through a locked cabinet, and her ex-boyfriend being more in charge of her medications in which was confirmed with her ex-boyfriend.  Patient has an extensive history of ER visits and hospitalizations in which she has displayed aggression, psychosis, and danger to self while in proximity of a Recruitment consultant. Patient also shows high level of lethality, intensity, and frequency of suicidal attempts with multiple inpatient psychiatric admissions  with little benefit so we continue to recommend [facility]. Patient is currently on the [facility] waitlist and we are working closely with LSW with bed availability. She was informed on 8/8 that she has been dropped by her CST; this caused acute worsening of reported hallucinations, suicidality, homicidality and agitation (fear of abandonment is central feature in BPD) and consideration of community f/u was terminated.   Today we discussed reducing olanzapine to 15 d/t oversedation (was on 10 @ time of admission).    Diagnoses:  Active Hospital problems: Principal Problem:   Bipolar I disorder, current or most recent episode depressed, with psychotic features (HCC) Active Problems:   Asthma   GAD (generalized anxiety disorder)   Suicide attempt (HCC)   Overdose   Drug overdose    Plan   ## Psychiatric Medication Recommendations:  -- d  Zyprexa to 15 mg po nightly for psychosis -- Continue Cymbalta 60 mg daily (this has been increased)  -- Continue Gabapentin 600 mg TID for agitated mood/anxiety -- c prazosin 1 mg at bedtime PTSD Agitation Protocol -- PRN Zyprexa 10mg  IM for agitation -- PRN Ativan 0.5 mg oral BID PRN for agitation -- PRN Zyprexa zydis 5 mg BID for agitation  ## Medical Decision Making Capacity:   Capacity was not formally addressed during this encounter  ## Further Work-up:  -- Per primary -- most recent EKG on 08/30/2022 had QtC of 469  ## Disposition:  -- We recommend inpatient psychiatric hospitalization after medical hospitalization. Patient has been involuntarily committed on 09/04/2022.  -- IVC renewed on 8/7 -- Made several attempts to contact CST team leading into being notified that she was dropped with no answer   ##  Behavioral / Environmental:  --  To minimize splitting of staff, assign one staff person to communicate all information from the team when feasible. -- Apply the established behavioral rules and protocols consistently throughout the night (see  nursing care order for details). Ensure that all staff members are aware of and adhere to the same guidelines to avoid confusion and mixed messages.   ## Safety and Observation Level:  - Based on my clinical evaluation, I estimate the patient to be at elevated risk of self harm in the current setting - At this time, we recommend continuing a 1: 1 level of observation. This decision is based on my review of the chart including patient's history and current presentation, interview of the patient, mental status examination, and consideration of suicide risk including evaluating suicidal ideation, plan, intent, suicidal or self-harm behaviors, risk factors, and protective factors. This judgment is based on our ability to directly address suicide risk, implement suicide prevention strategies and develop a safety plan while the patient is in the clinical setting. Please contact our team if there is a concern that risk level has changed.  Suicide risk assessment  Patient has following modifiable risk factors for suicide: active suicidal ideation, under treated depression , recklessness, and lack of access to outpatient mental health resources, which we are addressing by continuing adequate safety precautions while she is hospitalized and adjusting medications   Patient has following non-modifiable or demographic risk factors for suicide: early widowhood, history of suicide attempt, history of self harm behavior, and psychiatric hospitalization  Patient has the following protective factors against suicide: Supportive friends  Thank you for this consult request. Recommendations have been communicated to the primary team.  We will follow at this time.   Kizzie Ide, MD PGY-2 Psychiatry   Psychiatric and Social History   Relevant Aspects of Hospital Course:  Admitted on 08/22/2022 for suicide attempt by overdose on her prescription medication.   Patient Report:  [name] alone this AM and afternoon.  She has made an effort to get out of bed and walk around the unit - praised these efforts. Remains fairly discharge focused but discussion felt more open, less forced. Continues to work on strengthening relationship with roommate/ex boyfriend who she is now planning to marry. Remains very against idea of termination session with CST "I want to be done crying". WE discussed dec of olanzapine inc possible consequence of worsening hallucinations. We discussed setting a timer when she has SI and tracking if she is still suicidal when timer goes off as part of her overall goal of dealing with stress more productively.   Today she denied SI, HI, AH/VH.   Collateral 8/12: tried to call Fayrene Fearing with no response.   8/9 Called Debra with CST - stated she would be happy to conduct a termination session with [facility] if she reached out . She has not been called by [name] during this hospitalization.   8/9 Called Dr. Pershing Proud with [facility] to discuss pt's recent decompensation   Sample nursing notes illustrating behavior over the weekend:  8/10 PM - Illene Regulus RN  At shift change patient became very agitated and violent Civil engineer, contracting.  She started banging the cabinet in room.  Opening and slamming cabinet.  Then she exited the room half naked and destroyed the hand sanitizer.  She removed batteries from the sanitizer and threw them at the sitter.   Security was called and an order of Zyprexa IM was requested by day nurse.  By the time security came patient was calmer back in bed.   Only gave the scheduled PO Zyprexa that patient took without incident.            [employer] nto note above   Past Psychiatric History: Previous Psych Diagnoses: Schizoaffective disorder bipolar type, Anxiety, Autism spectrum disorder, PTSD, Borderline personality disorder Prior inpatient treatment: multiple, 15 ED visits and 4 inpatient admissions since 06/2022 (~50 total in 2024), admitted to [facility] from 07/29/22-08/02/2022, has been to [facility] for eight months in 2016, and was discharged from inpatient facility past week Current/prior outpatient treatment: [facility], CST team twice a week Prior rehab hx: denies multiple Psychotherapy hx: [facility] History of suicide: 20-30 times by overdosing on pills, cutting or drinking 409 disinfectant History of homicide or aggression: denies Psychiatric medication history:  Previously trialed Tegretol, Haldol, Clozaril, Depakote, Invega, Lamictal, Seroquel, asenapine, abilify, vraylar (worsened suicidal thoughts), Lexapro, Zoloft  Neuromodulation history: denies  Social History:  Marital status: Widowed Children: 19 year old daughter Lives: with ex-boyfriend Fayrene Fearing, in two story home Employment: Disability, 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  Access to weapons: None   Exam Findings   Psychiatric Specialty Exam:  Presentation  General Appearance: Appropriate for Environment; Casual  Eye Contact:Poor  Speech:-- (a little slower than yesterday, still fast)  Speech Volume:Normal  Handedness:Right   Mood and Affect  Mood:-- "Okay"  Affect:-- congruent, restrictive   Thought Process  Thought Processes:Goal Directed  Descriptions of Associations:Intact  Orientation:Full (Time, Place and Person)  Thought Content:Logical  History of Schizophrenia/Schizoaffective disorder: Yes  Duration of Psychotic Symptoms:Greater than six months  Hallucinations: Occasionally hearing her parents' voice  Ideas of Reference:Paranoia  Suicidal Thoughts: Denies  Homicidal Thoughts: Denies   Sensorium  Memory:Immediate Good; Recent Good; Remote Good  Judgment:Poor  Insight: Fair   Art therapist  Concentration:Fair  Attention Span:Fair  Recall:Fair  Fund of Knowledge:Fair  Language:Fair   Psychomotor Activity  Psychomotor Activity: None   Assets   Assets:Desire for Improvement; Communication Skills   Sleep  Sleep: Fair    Physical Exam: Vital signs:  Temp:  [97.6 F (36.4 C)-98.5 F (36.9 C)] 97.6 F (36.4 C) (08/13 0555) Pulse Rate:  [94-120] 94 (08/13 0555) Resp:  [19] 19 (08/13 0555) BP: (113-131)/(76-90) 113/90 (08/13 0555) SpO2:  [99 %] 99 % (08/13 0849) Physical Exam Vitals and nursing note reviewed.  Constitutional:      General: She is not in acute distress.    Appearance: She is not ill-appearing.  Pulmonary:     Effort: Pulmonary effort is normal. No respiratory distress.  Skin:    General: Skin is warm and dry.  Neurological:     General: No focal deficit present.     Mental Status: She is alert.  Psychiatric:        Attention and Perception: Attention normal.        Speech: Speech normal.     Blood pressure (!) 113/90, pulse 94, temperature 97.6 F (36.4 C), temperature source Oral, resp. rate 19, height 5\' 5"  (1.651 m), weight 98.2 kg, last menstrual period 08/08/2022, SpO2 99%. Body mass index is 36.03 kg/m.   Other History   These have been pulled in through the EMR, reviewed, and updated if appropriate.   Family History:  The patient's family history includes Asthma in her father; Mental illness in her father; PDD in her brother; Seizures in her brother.  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    Chronic constipation    Depression    Drug-seeking behavior    Essential tremor    Headache    Ineffective individual coping 05/16/2022   Insomnia 01/12/2022   Intentional drug overdose (HCC) 06/05/2022   Neuromuscular disorder (HCC)    Normocytic anemia  06/05/2022   Overdose 07/22/2017   Overdose of acetaminophen 07/2017   and other meds   Overdose, intentional self-harm, initial encounter (HCC) 07/20/2021   Paranoia (HCC) 04/22/2021   Personality disorder (HCC)    Purposeful non-suicidal drug ingestion (HCC) 06/27/2021   Schizo-affective psychosis (HCC)    Schizoaffective disorder (HCC) 07/29/2022   Schizoaffective disorder, bipolar type (HCC)    Seizures (HCC)    Last seizure December 2017   Skin erythema 04/27/2022   Sleep apnea    Suicidal behavior 07/25/2021   Suicidal ideation    Suicide (HCC) 07/01/2021   Suicide attempt (HCC) 07/04/2021    Surgical History: Past Surgical History:  Procedure Laterality Date   MOUTH SURGERY  2009 or 2010    Medications:   Current Facility-Administered Medications:    0.9 %  sodium chloride infusion, 250 mL, Intravenous, Continuous, Gonfa, Taye T, MD, Stopped at 08/24/22 1347   albuterol (PROVENTIL) (2.5 MG/3ML) 0.083% nebulizer solution 3 mL, 3 mL, Inhalation, Q6H PRN, Alanda Slim, Taye T, MD   cyclobenzaprine (FLEXERIL) tablet 10 mg, 10 mg, Oral, QHS, Gonfa, Taye T, MD, 10 mg at 09/17/22 2124   diclofenac Sodium (VOLTAREN) 1 % topical gel 2 g, 2 g, Topical, QID, Gonfa, Taye T, MD, 2 g at 09/18/22 1405   DULoxetine (CYMBALTA) DR capsule 60 mg, 60 mg, Oral, Daily, Akintayo, Mojeed, MD, 60 mg at 09/18/22 0947   ferrous sulfate tablet 325 mg, 325 mg, Oral, BID WC, Gonfa, Taye T, MD, 325 mg at 09/18/22 0947   gabapentin (NEURONTIN) capsule 600 mg, 600 mg, Oral, TID, Alanda Slim, Taye T, MD, 600 mg at 09/18/22 0947   hydrOXYzine (ATARAX) tablet 25 mg, 25 mg, Oral, TID PRN, Alanda Slim, Taye T, MD, 25 mg at 09/17/22 1309   ibuprofen (ADVIL) tablet 600 mg, 600 mg, Oral, TID PRN, Alanda Slim, Taye T, MD, 600 mg at 09/17/22 1137   LORazepam (ATIVAN) tablet 0.5 mg, 0.5 mg, Oral, BID PRN, Alanda Slim, Taye T, MD, 0.5 mg at 09/18/22 1405   mometasone-formoterol (DULERA) 200-5 MCG/ACT inhaler 2 puff, 2 puff, Inhalation, BID,  Alanda Slim, Taye T, MD, 2 puff at 09/18/22 0848   mupirocin cream (BACTROBAN) 2 %, , Topical, Daily, Marguerita Merles Ewing, DO, Given at 09/16/22 0847   OLANZapine (ZYPREXA) injection 10 mg, 10 mg, Intramuscular, Once PRN, Akintayo, Mojeed, MD   OLANZapine (ZYPREXA) tablet 15 mg, 15 mg, Oral, Daily, Alessander Sikorski A   OLANZapine zydis (ZYPREXA) disintegrating tablet 5 mg, 5 mg, Oral, BID PRN, Mariel Craft, MD, 5 mg at 09/12/22 2135   ondansetron (ZOFRAN-ODT) disintegrating tablet 4 mg, 4 mg, Oral, Q8H PRN, Sheikh, Omair Latif, DO, 4 mg at 09/17/22 1700   Oral care mouth rinse, 15 mL, Mouth Rinse, PRN, Osvaldo Shipper, MD   pantoprazole (PROTONIX)  EC tablet 40 mg, 40 mg, Oral, QAC breakfast, Gonfa, Taye T, MD, 40 mg at 09/18/22 0947   polyethylene glycol (MIRALAX / GLYCOLAX) packet 17 g, 17 g, Oral, BID PRN, Candelaria Stagers T, MD, 17 g at 09/17/22 2129   prazosin (MINIPRESS) capsule 1 mg, 1 mg, Oral, QHS, Gonfa, Taye T, MD, 1 mg at 09/17/22 2124   senna-docusate (Senokot-S) tablet 1 tablet, 1 tablet, Oral, BID PRN, Candelaria Stagers T, MD, 1 tablet at 09/17/22 2129   sodium bicarbonate tablet 650 mg, 650 mg, Oral, TID, Candelaria Stagers T, MD, 650 mg at 09/18/22 0947  Allergies: Allergies  Allergen Reactions   Bee Venom Anaphylaxis   Coconut Flavor Anaphylaxis and Rash   Fish Allergy Anaphylaxis   Geodon [Ziprasidone Hcl] Other (See Comments)    Pt states that this medication causes paralysis of the mouth.     Haloperidol And Related Other (See Comments)    Pt states that this medication causes paralysis of the mouth, jaw locks up   Lithobid [Lithium] Other (See Comments)    Seizure-like activity    Roxicodone [Oxycodone] Other (See Comments)    Hallucinations    Seroquel [Quetiapine] Other (See Comments)    Severe drowsiness - Pt currently taking 12.5 mg BID, she is ok with this dose   Shellfish Allergy Anaphylaxis   Phenergan [Promethazine Hcl] Other (See Comments)    Chest pain     Prilosec  [Omeprazole] Nausea And Vomiting and Other (See Comments)    Pt can take protonix with no problems    Sulfa Antibiotics Other (See Comments)    Chest pain    Tegretol [Carbamazepine] Nausea And Vomiting   Prozac [Fluoxetine] Other (See Comments)    Increased Depression and Suicidal thoughts   Tape Other (See Comments)    Skin tears, can only tolerate paper tape.   Tylenol [Acetaminophen] Rash and Other (See Comments)    Rash on face

## 2022-09-19 DIAGNOSIS — T50902A Poisoning by unspecified drugs, medicaments and biological substances, intentional self-harm, initial encounter: Secondary | ICD-10-CM | POA: Diagnosis not present

## 2022-09-19 DIAGNOSIS — F411 Generalized anxiety disorder: Secondary | ICD-10-CM | POA: Diagnosis not present

## 2022-09-19 DIAGNOSIS — F315 Bipolar disorder, current episode depressed, severe, with psychotic features: Secondary | ICD-10-CM | POA: Diagnosis not present

## 2022-09-19 DIAGNOSIS — J4520 Mild intermittent asthma, uncomplicated: Secondary | ICD-10-CM | POA: Diagnosis not present

## 2022-09-19 MED ORDER — STERILE WATER FOR INJECTION IJ SOLN
INTRAMUSCULAR | Status: AC
  Filled 2022-09-19: qty 10

## 2022-09-19 NOTE — TOC Progression Note (Signed)
Transition of Care [facility] - Progression Note    Patient Details  Name: Santoya Heming MRN: [id number] Date of Birth: 07-06-1990  Transition of Care [facility]) CM/SW Contact  Lorri Frederick, LCSW Phone Number: 09/19/2022, 2:21 PM  Clinical Narrative:   IVC paperwork renewal completed, efile to magistrate, custody order received by fax.  Call to [facility] and they will send officer for service.          Expected Discharge Plan and Services                                               Social Determinants of Health (SDOH) Interventions SDOH Screenings   Food Insecurity: No Food Insecurity (07/29/2022)  Housing: Low Risk  (07/29/2022)  Transportation Needs: No Transportation Needs (07/29/2022)  Recent Concern: Transportation Needs - Unmet Transportation Needs (05/06/2022)  Utilities: Not At Risk (07/29/2022)  Alcohol Screen: Low Risk  (07/29/2022)  Depression (PHQ2-9): Medium Risk (03/10/2022)  Tobacco Use: Medium Risk (09/07/2022)    Readmission Risk Interventions    06/06/2022    3:38 PM  Readmission Risk Prevention Plan  Transportation Screening Complete  Medication Review (RN Care Manager) Complete  PCP or Specialist appointment within 3-5 days of discharge Complete  HRI or Home Care Consult Complete  SW Recovery Care/Counseling Consult Complete  Palliative Care Screening Not Applicable  Skilled Nursing Facility Not Applicable

## 2022-09-19 NOTE — Consult Note (Addendum)
[name] Consult Evaluation  Service Date: September 19, 2022 LOS:  LOS: 28 days    Primary Psychiatric Diagnoses  Schizoaffective disorder, bipolar type  GAD Borderline personality disorder  Assessment  [name] is a 32 y.o. female with a past psychiatric history of borderline personality disorder, schizoaffective disorder bipolar type, and PTSD.  She has had numerous behavioral health admissions and an extensive history of suicide attempts.  Patient presents to [facility] ED for an attempted overdose on her prescription medications.  Briefly, this pt has had ~50 ED visits or psych hospitalizations during 2024; this continues a pattern of an inability to maintain safity in the outpatient setting going back multiple years. The vast majority of her suicide attempts have been via overdose on psychotropic medication. She has failed multiple levels of outpt management up to and including CST; she has been declined my multiple psychiatric facilities due to futility, and she has been discharged by her CST team as of 8/8. She is currently on the [facility] priority wait list. She is not eligible for ACT services through [facility] due to BPD diagnosis.   We are trying to simplify patient's medication regimen to avoid polypharmacy especially in the setting of her recent overdose. Earlier in hospitalization during a period of improvement (roughly 8/1 to 8/8), we discussed medication management if and when she does return home including closer monitoring, less access to her medications through a locked cabinet, and her ex-boyfriend being more in charge of her medications in which was confirmed with her ex-boyfriend.n dropped by her CST; this caused acute worsening of reported hallucinations, suicidality, homicidality and agitation (fear of abandonment is central feature in BPD) and consideration of community f/u was terminated. Patient has an extensive history of ER visits and hospitalizations in  which she has displayed aggression, psychosis, and danger to self even while in proximity of a Recruitment consultant. Patient also shows high level of lethality, intensity, and frequency of suicidal attempts with multiple inpatient psychiatric admissions with little benefit so we continue to recommend [facility]; given wait list will also refer to other psychiatric hospitals. Patient is currently on the [facility] priority waitlist and we are working closely with LSW with bed availability.   8/14: a little worse, suicidal again. Appreciate Dr. Weston Settle who was present for interview filling out IVC.   Diagnoses:  Active Hospital problems: Principal Problem:   Bipolar I disorder, current or most recent episode depressed, with psychotic features (HCC) Active Problems:   Asthma   GAD (generalized anxiety disorder)   Suicide attempt (HCC)   Overdose   Drug overdose    Plan   ## Psychiatric Medication Recommendations:  -- c  Zyprexa to 15 mg po nightly for psychosis (10 at admission, up to 20 with sedation) -- Continue Cymbalta 60 mg daily (this has been increased)  -- Continue Gabapentin 600 mg TID for agitated mood/anxiety -- c prazosin 1 mg at bedtime PTSD Agitation Protocol -- PRN Zyprexa 10mg  IM for agitation -- PRN Ativan 0.5 mg oral BID PRN for agitation -- PRN Zyprexa zydis 5 mg BID for agitation  ## Medical Decision Making Capacity:   Capacity was not formally addressed during this encounter  ## Further Work-up:  -- Per primary -- most recent EKG on 08/30/2022 had QtC of 469  ## Disposition:  -- We recommend inpatient psychiatric hospitalization after medical hospitalization. Patient has been involuntarily committed on 09/04/2022.  -- IVC renewed on 8/7 -- Made several attempts to contact CST team  leading into being notified that she was dropped with no answer   ## Behavioral / Environmental:  --  To minimize splitting of staff, assign one staff person to communicate all information from the team  when feasible. -- Apply the established behavioral rules and protocols consistently throughout the night (see nursing care order for details). Ensure that all staff members are aware of and adhere to the same guidelines to avoid confusion and mixed messages.   ## Safety and Observation Level:  - Based on my clinical evaluation, I estimate the patient to be at elevated risk of self harm in the current setting - At this time, we recommend continuing a 1: 1 level of observation. This decision is based on my review of the chart including patient's history and current presentation, interview of the patient, mental status examination, and consideration of suicide risk including evaluating suicidal ideation, plan, intent, suicidal or self-harm behaviors, risk factors, and protective factors. This judgment is based on our ability to directly address suicide risk, implement suicide prevention strategies and develop a safety plan while the patient is in the clinical setting. Please contact our team if there is a concern that risk level has changed.  Suicide risk assessment  Patient has following modifiable risk factors for suicide: active suicidal ideation, under treated depression , recklessness, and lack of access to outpatient mental health resources, which we are addressing by continuing adequate safety precautions while she is hospitalized and adjusting medications   Patient has following non-modifiable or demographic risk factors for suicide: early widowhood, history of suicide attempt, history of self harm behavior, and psychiatric hospitalization  Patient has the following protective factors against suicide: Supportive friends  Thank you for this consult request. Recommendations have been communicated to the primary team.  We will follow at this time.   Kizzie Ide, MD PGY-2 Psychiatry   Psychiatric and Social History   Relevant Aspects of Hospital Course:  Admitted on 08/22/2022 for suicide  attempt by overdose on her prescription medication.   Patient Report:  [name] this AM with team. She endorses worsening suicidal ideations discussing how she wants to die. Encouraged her to follow up on activity - had sitter set 15 min and 1 hr timer to check in on these feelings. No reported hallucinations nor responding. Briefly discussed if she continues to decompensate will need to either increase zyprexa again or crosstitrate antipsychotics. Expresses again feeling that her parents are out to get her which has generalized to most people. No HI today.    Collateral 8/14: called James, no response 8/12: tried to call Fayrene Fearing with no response.   8/9 Called Debra with CST - stated she would be happy to conduct a termination session with Kiwanda if she reached out . She has not been called by [name] during this hospitalization.   8/9 Called Dr. Pershing Proud with [facility] to discuss pt's recent decompensation       [employer] nto note above   Past Psychiatric History: Previous Psych Diagnoses: Schizoaffective disorder bipolar type, Anxiety, Autism spectrum disorder, PTSD, Borderline personality disorder Prior inpatient treatment: multiple, 15 ED visits and 4 inpatient admissions since 06/2022 (~50 total in 2024), admitted to [facility] from 07/29/22-08/02/2022, has been to [facility] for eight months in 2016, and was discharged from inpatient facility past week Current/prior outpatient treatment: [facility], CST team twice a week Prior rehab hx: denies multiple Psychotherapy hx: [facility] History of suicide: 20-30 times by overdosing on pills, cutting or drinking  409 disinfectant History of homicide or aggression: denies Psychiatric medication history:  Previously trialed Tegretol, Haldol, Clozaril, Depakote, Invega, Lamictal, Seroquel, asenapine, abilify, vraylar (worsened suicidal thoughts), Lexapro, Zoloft  Neuromodulation history: denies  Social History:  Marital status:  Widowed Children: 100 year old daughter Lives: with ex-boyfriend Fayrene Fearing, in two story home Employment: Disability, 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  Access to weapons: None   Exam Findings   Psychiatric Specialty Exam:  Presentation  General Appearance: Appropriate for Environment; Casual  Eye Contact:Poor  Speech:Normal Rate  Speech Volume:Normal  Handedness:Right   Mood and Affect  Mood:-- "Okay"  Affect:-- congruent, restrictive   Thought Process  Thought Processes:Goal Directed  Descriptions of Associations:Intact  Orientation:Full (Time, Place and Person)  Thought Content:Logical  History of Schizophrenia/Schizoaffective disorder: Yes  Duration of Psychotic Symptoms:Greater than six months  Hallucinations: Occasionally hearing her parents' voice  Ideas of Reference:Paranoia  Suicidal Thoughts: Denies  Homicidal Thoughts: Denies   Sensorium  Memory:Immediate Good; Recent Good; Remote Good  Judgment:Poor  Insight: Fair   Art therapist  Concentration:Fair  Attention Span:Fair  Recall:Fair  Fund of Knowledge:Fair  Language:Fair   Psychomotor Activity  Psychomotor Activity: None   Assets  Assets:Desire for Improvement; Communication Skills   Sleep  Sleep: Fair    Physical Exam: Vital signs:  Temp:  [97.6 F (36.4 C)-98.5 F (36.9 C)] 97.6 F (36.4 C) (08/14 1450) Pulse Rate:  [103-107] 103 (08/14 1450) Resp:  [16-20] 20 (08/14 1450) BP: (114-133)/(60-84) 114/60 (08/14 1450) SpO2:  [97 %-100 %] 99 % (08/14 1450) Physical Exam Vitals and nursing note reviewed.  Constitutional:      General: She is not in acute distress.    Appearance: She is not ill-appearing.  Pulmonary:     Effort: Pulmonary effort is normal. No respiratory distress.  Skin:    General: Skin is warm and dry.  Neurological:     General: No focal deficit present.     Mental Status: She is  alert.  Psychiatric:        Attention and Perception: Attention normal.        Speech: Speech normal.     Blood pressure 114/60, pulse (!) 103, temperature 97.6 F (36.4 C), temperature source Oral, resp. rate 20, height 5\' 5"  (1.651 m), weight 98.2 kg, last menstrual period 08/08/2022, SpO2 99%. Body mass index is 36.03 kg/m.   Other History   These have been pulled in through the EMR, reviewed, and updated if appropriate.   Family History:  The patient's family history includes Asthma in her father; Mental illness in her father; PDD in her brother; Seizures in her brother.  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    Chronic constipation    Depression    Drug-seeking behavior    Essential tremor    Headache    Ineffective individual coping 05/16/2022   Insomnia 01/12/2022   Intentional drug overdose (HCC) 06/05/2022   Neuromuscular disorder (HCC)    Normocytic anemia 06/05/2022   Overdose 07/22/2017   Overdose of acetaminophen 07/2017   and  other meds   Overdose, intentional self-harm, initial encounter (HCC) 07/20/2021   Paranoia (HCC) 04/22/2021   Personality disorder (HCC)    Purposeful non-suicidal drug ingestion (HCC) 06/27/2021   Schizo-affective psychosis (HCC)    Schizoaffective disorder (HCC) 07/29/2022   Schizoaffective disorder, bipolar type (HCC)    Seizures (HCC)    Last seizure December 2017   Skin erythema 04/27/2022   Sleep apnea    Suicidal behavior 07/25/2021   Suicidal ideation    Suicide (HCC) 07/01/2021   Suicide attempt (HCC) 07/04/2021    Surgical History: Past Surgical History:  Procedure Laterality Date    MOUTH SURGERY  2009 or 2010    Medications:   Current Facility-Administered Medications:    0.9 %  sodium chloride infusion, 250 mL, Intravenous, Continuous, Gonfa, Taye T, MD, Stopped at 08/24/22 1347   albuterol (PROVENTIL) (2.5 MG/3ML) 0.083% nebulizer solution 3 mL, 3 mL, Inhalation, Q6H PRN, Alanda Slim, Taye T, MD   cyclobenzaprine (FLEXERIL) tablet 10 mg, 10 mg, Oral, QHS, Gonfa, Taye T, MD, 10 mg at 09/18/22 2132   diclofenac Sodium (VOLTAREN) 1 % topical gel 2 g, 2 g, Topical, QID, Gonfa, Taye T, MD, 2 g at 09/19/22 1321   DULoxetine (CYMBALTA) DR capsule 60 mg, 60 mg, Oral, Daily, Akintayo, Mojeed, MD, 60 mg at 09/19/22 0941   ferrous sulfate tablet 325 mg, 325 mg, Oral, BID WC, Gonfa, Taye T, MD, 325 mg at 09/19/22 0942   gabapentin (NEURONTIN) capsule 600 mg, 600 mg, Oral, TID, Alanda Slim, Taye T, MD, 600 mg at 09/19/22 0942   hydrOXYzine (ATARAX) tablet 25 mg, 25 mg, Oral, TID PRN, Alanda Slim, Taye T, MD, 25 mg at 09/17/22 1309   ibuprofen (ADVIL) tablet 600 mg, 600 mg, Oral, TID PRN, Alanda Slim, Taye T, MD, 600 mg at 09/19/22 1321   LORazepam (ATIVAN) tablet 0.5 mg, 0.5 mg, Oral, BID PRN, Alanda Slim, Taye T, MD, 0.5 mg at 09/19/22 1118   mometasone-formoterol (DULERA) 200-5 MCG/ACT inhaler 2 puff, 2 puff, Inhalation, BID, Alanda Slim, Taye T, MD, 2 puff at 09/19/22 0838   mupirocin cream (BACTROBAN) 2 %, , Topical, Daily, Marguerita Merles [place], DO, Given at 09/16/22 0847   OLANZapine (ZYPREXA) injection 10 mg, 10 mg, Intramuscular, Once PRN, Akintayo, Mojeed, MD   OLANZapine (ZYPREXA) tablet 15 mg, 15 mg, Oral, Daily, Siris Hoos A, 15 mg at 09/18/22 2132   OLANZapine zydis (ZYPREXA) disintegrating tablet 5 mg, 5 mg, Oral, BID PRN, Mariel Craft, MD, 5 mg at 09/12/22 2135   ondansetron (ZOFRAN-ODT) disintegrating tablet 4 mg, 4 mg, Oral, Q8H PRN, Sheikh, Omair Latif, DO, 4 mg at 09/17/22 1700   Oral care mouth rinse, 15 mL, Mouth Rinse, PRN, Osvaldo Shipper, MD   pantoprazole (PROTONIX) EC tablet  40 mg, 40 mg, Oral, QAC breakfast, Gonfa, Taye T, MD, 40 mg at 09/19/22 0942   polyethylene glycol (MIRALAX / GLYCOLAX) packet 17 g, 17 g, Oral, BID PRN, Alanda Slim, Taye T, MD, 17 g at 09/18/22 2133   prazosin (MINIPRESS) capsule 1 mg, 1 mg, Oral, QHS, Gonfa, Taye T, MD, 1 mg at 09/18/22 2132   senna-docusate (Senokot-S) tablet 1 tablet, 1 tablet, Oral, BID PRN, Candelaria Stagers T, MD, 1 tablet at 09/18/22 2132   sodium bicarbonate tablet 650 mg, 650 mg, Oral, TID, Candelaria Stagers T, MD, 650 mg at 09/18/22 2132  Allergies: Allergies  Allergen Reactions   Bee Venom Anaphylaxis   Coconut Flavor Anaphylaxis and Rash   Fish Allergy Anaphylaxis   Geodon [Ziprasidone Hcl]  Other (See Comments)    Pt states that this medication causes paralysis of the mouth.     Haloperidol And Related Other (See Comments)    Pt states that this medication causes paralysis of the mouth, jaw locks up   Lithobid [Lithium] Other (See Comments)    Seizure-like activity    Roxicodone [Oxycodone] Other (See Comments)    Hallucinations    Seroquel [Quetiapine] Other (See Comments)    Severe drowsiness - Pt currently taking 12.5 mg BID, she is ok with this dose   Shellfish Allergy Anaphylaxis   Phenergan [Promethazine Hcl] Other (See Comments)    Chest pain     Prilosec [Omeprazole] Nausea And Vomiting and Other (See Comments)    Pt can take protonix with no problems    Sulfa Antibiotics Other (See Comments)    Chest pain    Tegretol [Carbamazepine] Nausea And Vomiting   Prozac [Fluoxetine] Other (See Comments)    Increased Depression and Suicidal thoughts   Tape Other (See Comments)    Skin tears, can only tolerate paper tape.   Tylenol [Acetaminophen] Rash and Other (See Comments)    Rash on face

## 2022-09-19 NOTE — Plan of Care (Signed)
  Problem: Coping: Goal: Ability to adjust to condition or change in health will improve Outcome: Not Progressing

## 2022-09-19 NOTE — Plan of Care (Signed)
Patient alert and oriented, calm and cooperative this evening. No distress noted, no complaints voiced. 1:1 safety attendant at bedside. All needs attended to. Currently sleeping well. Will continue to monitor.  Problem: Education: Goal: Knowledge of General Education information will improve Description: Including pain rating scale, medication(s)/side effects and non-pharmacologic comfort measures 09/19/2022 0255 by Ramon Dredge, RN Outcome: Progressing 09/19/2022 0255 by Ramon Dredge, RN Outcome: Progressing   Problem: Health Behavior/Discharge Planning: Goal: Ability to manage health-related needs will improve 09/19/2022 0255 by Ramon Dredge, RN Outcome: Progressing 09/19/2022 0255 by Ramon Dredge, RN Outcome: Progressing   Problem: Clinical Measurements: Goal: Ability to maintain clinical measurements within normal limits will improve 09/19/2022 0255 by Ramon Dredge, RN Outcome: Progressing 09/19/2022 0255 by Ramon Dredge, RN Outcome: Progressing Goal: Will remain free from infection 09/19/2022 0255 by Ramon Dredge, RN Outcome: Progressing 09/19/2022 0255 by Ramon Dredge, RN Outcome: Progressing Goal: Diagnostic test results will improve 09/19/2022 0255 by Ramon Dredge, RN Outcome: Progressing 09/19/2022 0255 by Ramon Dredge, RN Outcome: Progressing Goal: Cardiovascular complication will be avoided 09/19/2022 0255 by Ramon Dredge, RN Outcome: Progressing 09/19/2022 0255 by Ramon Dredge, RN Outcome: Progressing   Problem: Activity: Goal: Risk for activity intolerance will decrease 09/19/2022 0255 by Ramon Dredge, RN Outcome: Progressing 09/19/2022 0255 by Ramon Dredge, RN Outcome: Progressing   Problem: Coping: Goal: Level of anxiety will decrease 09/19/2022 0255 by Ramon Dredge, RN Outcome: Progressing 09/19/2022 0255 by Ramon Dredge, RN Outcome:  Progressing   Problem: Elimination: Goal: Will not experience complications related to bowel motility 09/19/2022 0255 by Ramon Dredge, RN Outcome: Progressing 09/19/2022 0255 by Ramon Dredge, RN Outcome: Progressing   Problem: Pain Managment: Goal: General experience of comfort will improve 09/19/2022 0255 by Ramon Dredge, RN Outcome: Progressing 09/19/2022 0255 by Ramon Dredge, RN Outcome: Progressing   Problem: Safety: Goal: Ability to remain free from injury will improve 09/19/2022 0255 by Ramon Dredge, RN Outcome: Progressing 09/19/2022 0255 by Ramon Dredge, RN Outcome: Progressing   Problem: Skin Integrity: Goal: Risk for impaired skin integrity will decrease 09/19/2022 0255 by Ramon Dredge, RN Outcome: Progressing 09/19/2022 0255 by Ramon Dredge, RN Outcome: Progressing   Problem: Role Relationship: Goal: Method of communication will improve 09/19/2022 0255 by Ramon Dredge, RN Outcome: Progressing 09/19/2022 0255 by Ramon Dredge, RN Outcome: Progressing   Problem: Coping: Goal: Ability to adjust to condition or change in health will improve 09/19/2022 0255 by Ramon Dredge, RN Outcome: Progressing 09/19/2022 0255 by Ramon Dredge, RN Outcome: Progressing   Problem: Fluid Volume: Goal: Ability to maintain a balanced intake and output will improve 09/19/2022 0255 by Ramon Dredge, RN Outcome: Progressing 09/19/2022 0255 by Ramon Dredge, RN Outcome: Progressing   Problem: Health Behavior/Discharge Planning: Goal: Ability to identify and utilize available resources and services will improve 09/19/2022 0255 by Ramon Dredge, RN Outcome: Progressing 09/19/2022 0255 by Ramon Dredge, RN Outcome: Progressing Goal: Ability to manage health-related needs will improve 09/19/2022 0255 by Ramon Dredge, RN Outcome: Progressing 09/19/2022 0255 by  Ramon Dredge, RN Outcome: Progressing   Problem: Metabolic: Goal: Ability to maintain appropriate glucose levels will improve 09/19/2022 0255 by Ramon Dredge, RN Outcome: Progressing 09/19/2022 0255 by Ramon Dredge, RN Outcome: Progressing   Problem: Nutritional: Goal: Maintenance of adequate nutrition will improve 09/19/2022 0255 by Ramon Dredge, RN Outcome:  Progressing 09/19/2022 0255 by Ramon Dredge, RN Outcome: Progressing Goal: Progress toward achieving an optimal weight will improve 09/19/2022 0255 by Ramon Dredge, RN Outcome: Progressing 09/19/2022 0255 by Ramon Dredge, RN Outcome: Progressing   Problem: Skin Integrity: Goal: Risk for impaired skin integrity will decrease 09/19/2022 0255 by Ramon Dredge, RN Outcome: Progressing 09/19/2022 0255 by Ramon Dredge, RN Outcome: Progressing   Problem: Tissue Perfusion: Goal: Adequacy of tissue perfusion will improve 09/19/2022 0255 by Ramon Dredge, RN Outcome: Progressing 09/19/2022 0255 by Ramon Dredge, RN Outcome: Progressing

## 2022-09-19 NOTE — Progress Notes (Signed)
PROGRESS NOTE    [name]  ZOX:[id number] DOB: 04-05-1990 DOA: 08/22/2022 PCP: [name]    Brief Narrative:   32 y.o. female with PMH of adjustment disorder with mixed anxiety and depressed mood, autism, bipolar 1 disorder, fragile x syndrome, seizures not on meds, schizoaffective disorder and multiple suicide attempts in the last year  presented to the ED after ingesting numerous medications in a suicide attempt. She reported taking #30 metoprolol 25mg  tablets, #60 Hydroxyzine 25mg , #7 Lexapro 20mg , #21 Gabapentin 600mg , #7 Flexeril 10mg , #7 protonix 40mg  and #7 Prazosin 1mg .  Patient was slightly bradycardic and hypotensive on EMS arrival which improved with fluids. Poison control was contacted and as she was within 2hrs of ingestion was given activated charcoal.  Patient was placed under IVC in ED. Poison control also recommended ICU admission for close Qtc and electrolyte monitoring and possible pressors.  She was intubated and started on vasopressor and glucagon. Patient was also started on ceftriaxone for possible pneumonia. Patient was extubated on 7/18 and transferred to the floor on 7/19. She was transferred to [facility] service on 08/25/22. Psychiatry have recommended inpatient psychiatric hospitalization.  Pending placement.  Assessment and Plan:  Suicidal Attempt with intentional overdose:  -Multiple medications including #30 metoprolol 25mg  tablets, #60 Hydroxyzine 25mg , #7 Lexapro 20mg , #21 Gabapentin 600mg , #7 Flexeril 10mg , #7 protonix 40mg  and #7 Prazosin 1mg .  Poison control for the patient and patient was recommended charcoal.  Initially required vasopressor glucagon intubation and mechanical ventilation.  Currently on IVC. Psychiatry placed APS referral as well.  Further care as per psychiatry.  On restraints and IM Zyprexa intermittently.     Anxiety and Depression, Schizoaffective Disorder, Adjustment Disorder, bipolar disorder, fibromyalgia and auditory  hallucination:  Improved hallucination. On Cogentin, Zyprexa, Atarax, prazosin and Lexapro at home.  Lexapro changed to Cymbalta. Zyprexa as needed and psychiatry recommending olanzapine 10 mg IM daily single dose as needed.   Continue gabapentin 600 mg 3 times daily for agitated mood/anxiety. Continue one-to-one sitter at bedside.  Currently has remained calm and cooperative.    CAP Resolved and completed course of antibiotic.  HTN Stable.  Continue Lopressor.   Sinus Tachycardia Continue Lopressor.  Improved.  Metabolic Acidosis Improved.   Hypomagnesemia Improved.  No recent lab.  Patient has refused.  Microcytic Anemia Iron deficiency anemia.  Low ferritin.  Continue oral iron.  History of Seizure Disorder -Stable.  Not on meds.   History of Asthma Continue nebulizers.  Stable.   Dysuria Was on Foley catheter during ICU.  Was recently treated with antibiotic.   Hypoalbuminemia Encourage oral intake.  Obesity Body mass index is 36.03 kg/m.  Would benefit from weight loss as outpatient.     DVT prophylaxis: SCDs Start: 08/22/22 1514   Code Status:     Code Status: Full Code  Disposition: Inpatient psych unit, medically stable for disposition  Status is: Inpatient  Remains inpatient appropriate because: IVC, need for inpatient psych admission   Family Communication: None at bedside  Consultants:  Psychiatry  Procedures:  None  Antimicrobials:  None   Subjective: Today, patient was seen and examined at bedside.  Denies interval complaints.  Appears to be calm and cooperative.    Objective: Vitals:   09/18/22 0849 09/18/22 1658 09/18/22 2148 09/19/22 0805  BP:  120/67 132/73 133/84  Pulse:  (!) 105 (!) 103 (!) 107  Resp:  17 16 19   Temp:  98.5 F (36.9 C) 97.8 F (36.6 C) 98.2 F (36.8  C)  TempSrc:  Oral Oral Oral  SpO2: 99% 99% 100% 97%  Weight:      Height:        Intake/Output Summary (Last 24 hours) at 09/19/2022 1323 Last data filed  at 09/19/2022 0900 Gross per 24 hour  Intake 960 ml  Output 0 ml  Net 960 ml   Filed Weights   09/01/22 0300 09/02/22 0500 09/03/22 0402  Weight: 98.8 kg 99 kg 98.2 kg    Physical Examination: Body mass index is 36.03 kg/m.   General: Obese built, not in obvious distress, calm and cooperative HENT:   No scleral pallor or icterus noted. Oral mucosa is moist.  Chest: Diminished breath sounds bilaterally. No crackles or wheezes.  CVS: S1 &S2 heard. No murmur.  Regular rate and rhythm. Abdomen: Soft, nontender, nondistended.  Bowel sounds are heard.   Extremities: No cyanosis, clubbing or edema.  Peripheral pulses are palpable. Psych: Alert, awake and not in distress, CNS:  No cranial nerve deficits.  Moves all extremities. Skin: Warm and dry.  No rashes noted.  Data Reviewed:   CBC: No results for input(s): "WBC", "NEUTROABS", "HGB", "HCT", "MCV", "PLT" in the last 168 hours.  Basic Metabolic Panel: No results for input(s): "NA", "K", "CL", "CO2", "GLUCOSE", "BUN", "CREATININE", "CALCIUM", "MG", "PHOS" in the last 168 hours.  Liver Function Tests: No results for input(s): "AST", "ALT", "ALKPHOS", "BILITOT", "PROT", "ALBUMIN" in the last 168 hours.   Radiology Studies: No results found.    LOS: 28 days    Joycelyn Das, MD [facility] Available via Epic secure chat 7am-7pm After these hours, please refer to coverage provider listed on [email or web address] 09/19/2022, 1:23 PM

## 2022-09-20 DIAGNOSIS — T50902A Poisoning by unspecified drugs, medicaments and biological substances, intentional self-harm, initial encounter: Secondary | ICD-10-CM | POA: Diagnosis not present

## 2022-09-20 DIAGNOSIS — F411 Generalized anxiety disorder: Secondary | ICD-10-CM | POA: Diagnosis not present

## 2022-09-20 DIAGNOSIS — J4520 Mild intermittent asthma, uncomplicated: Secondary | ICD-10-CM | POA: Diagnosis not present

## 2022-09-20 DIAGNOSIS — F315 Bipolar disorder, current episode depressed, severe, with psychotic features: Secondary | ICD-10-CM | POA: Diagnosis not present

## 2022-09-20 DIAGNOSIS — T1491XA Suicide attempt, initial encounter: Secondary | ICD-10-CM | POA: Diagnosis not present
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
## 2022-09-20 NOTE — Plan of Care (Signed)
  Problem: Education: Goal: Knowledge of General Education information will improve Description: Including pain rating scale, medication(s)/side effects and non-pharmacologic comfort measures Outcome: Progressing   Problem: Health Behavior/Discharge Planning: Goal: Ability to manage health-related needs will improve Outcome: Progressing   Problem: Clinical Measurements: Goal: Ability to maintain clinical measurements within normal limits will improve Outcome: Progressing Goal: Will remain free from infection Outcome: Progressing Goal: Diagnostic test results will improve Outcome: Progressing Goal: Cardiovascular complication will be avoided Outcome: Progressing   Problem: Activity: Goal: Risk for activity intolerance will decrease Outcome: Progressing   Problem: Coping: Goal: Level of anxiety will decrease Outcome: Progressing   Problem: Elimination: Goal: Will not experience complications related to bowel motility Outcome: Progressing   Problem: Pain Managment: Goal: General experience of comfort will improve Outcome: Progressing   Problem: Safety: Goal: Ability to remain free from injury will improve Outcome: Progressing   Problem: Skin Integrity: Goal: Risk for impaired skin integrity will decrease Outcome: Progressing   Problem: Role Relationship: Goal: Method of communication will improve Outcome: Progressing   Problem: Coping: Goal: Ability to adjust to condition or change in health will improve Outcome: Progressing   Problem: Fluid Volume: Goal: Ability to maintain a balanced intake and output will improve Outcome: Progressing   Problem: Health Behavior/Discharge Planning: Goal: Ability to identify and utilize available resources and services will improve Outcome: Progressing Goal: Ability to manage health-related needs will improve Outcome: Progressing   Problem: Metabolic: Goal: Ability to maintain appropriate glucose levels will  improve Outcome: Progressing   Problem: Nutritional: Goal: Maintenance of adequate nutrition will improve Outcome: Progressing Goal: Progress toward achieving an optimal weight will improve Outcome: Progressing   Problem: Skin Integrity: Goal: Risk for impaired skin integrity will decrease Outcome: Progressing   Problem: Tissue Perfusion: Goal: Adequacy of tissue perfusion will improve Outcome: Progressing

## 2022-09-20 NOTE — Progress Notes (Signed)
PROGRESS NOTE    [name]  ZOX:[id number] DOB: 05-Apr-1990 DOA: 08/22/2022 PCP: [name]    Brief Narrative:   32 y.o. female with PMH of adjustment disorder with mixed anxiety and depressed mood, autism, bipolar 1 disorder, fragile x syndrome, seizures not on meds, schizoaffective disorder and multiple suicide attempts in the last year  presented to the ED after ingesting numerous medications in a suicide attempt. She reported taking #30 metoprolol 25mg  tablets, #60 Hydroxyzine 25mg , #7 Lexapro 20mg , #21 Gabapentin 600mg , #7 Flexeril 10mg , #7 protonix 40mg  and #7 Prazosin 1mg .  Patient was slightly bradycardic and hypotensive on EMS arrival which improved with fluids. Poison control was contacted and as she was within 2hrs of ingestion was given activated charcoal.  Patient was placed under IVC in ED. Poison control also recommended ICU admission for close Qtc and electrolyte monitoring and possible pressors.  She was intubated and started on vasopressor and glucagon. Patient was also started on ceftriaxone for possible pneumonia. Patient was extubated on 7/18 and transferred to the floor on 7/19. She was transferred to [facility] service on 08/25/22. Psychiatry have recommended inpatient psychiatric hospitalization.  Pending placement.  Assessment and Plan:  Suicidal Attempt with intentional overdose:  -Multiple medications including #30 metoprolol 25mg  tablets, #60 Hydroxyzine 25mg , #7 Lexapro 20mg , #21 Gabapentin 600mg , #7 Flexeril 10mg , #7 protonix 40mg  and #7 Prazosin 1mg .  Poison control for the patient and patient was recommended charcoal.  Initially required vasopressor glucagon intubation and mechanical ventilation.  Currently on IVC. Psychiatry placed APS referral as well.  Further care as per psychiatry.  On restraints and IM Zyprexa intermittently.     Anxiety and Depression, Schizoaffective Disorder, Adjustment Disorder, bipolar disorder, fibromyalgia and auditory  hallucination:  Improved hallucination. On Cogentin, Zyprexa, Atarax, prazosin and Lexapro at home.  Lexapro changed to Cymbalta. Zyprexa as needed and psychiatry recommending olanzapine 10 mg IM daily single dose as needed.   Continue gabapentin 600 mg 3 times daily for agitated mood/anxiety. Continue one-to-one sitter at bedside.  Patient has remained stable at this time.   CAP Resolved and completed course of antibiotic.  HTN Stable.  Continue Lopressor.   Sinus Tachycardia Continue Lopressor.  Improved.  Metabolic Acidosis Improved.   Hypomagnesemia Improved.  No recent lab.  Patient has refused lab draws..  Microcytic Anemia Iron deficiency anemia.  Low ferritin.  Continue oral iron.  Refusing lab draws.  History of Seizure Disorder -Stable.  Not on meds.   History of Asthma Continue nebulizers.  Stable.   Dysuria Was on Foley catheter during ICU.  Was recently treated with antibiotic.  Improved at this time.   Hypoalbuminemia Encourage oral intake.  Obesity Body mass index is 36.03 kg/m.  Would benefit from weight loss as outpatient.     DVT prophylaxis: SCDs Start: 08/22/22 1514   Code Status:     Code Status: Full Code  Disposition: Awaiting inpatient psych unit, medically stable for disposition  Status is: Inpatient  Remains inpatient appropriate because: IVC, need for inpatient psych admission   Family Communication: None at bedside  Consultants:  Psychiatry  Procedures:  None  Antimicrobials:  None   Subjective: Today, patient was seen and examined at bedside.  Denies any interval complaints.  Appears to be comfortable.  Sitter at bedside  Objective: Vitals:   09/19/22 1450 09/19/22 2250 09/20/22 0700 09/20/22 0812  BP: 114/60 111/72 123/77   Pulse: (!) 103  (!) 109   Resp: 20 18 20    Temp:  97.6 F (36.4 C) 97.7 F (36.5 C) 97.8 F (36.6 C)   TempSrc: Oral Oral Oral   SpO2: 99%  99% 98%  Weight:      Height:         Intake/Output Summary (Last 24 hours) at 09/20/2022 1429 Last data filed at 09/20/2022 0755 Gross per 24 hour  Intake 2000 ml  Output --  Net 2000 ml   Filed Weights   09/01/22 0300 09/02/22 0500 09/03/22 0402  Weight: 98.8 kg 99 kg 98.2 kg    Physical Examination: Body mass index is 36.03 kg/m.   General: Obese built, not in obvious distress, alert awake and Communicative. HENT:   No scleral pallor or icterus noted. Oral mucosa is moist.  Chest: Diminished breath sounds bilaterally. No crackles or wheezes.  CVS: S1 &S2 heard. No murmur.  Regular rate and rhythm. Abdomen: Soft, nontender, nondistended.  Bowel sounds are heard.   Extremities: No cyanosis, clubbing or edema.  Peripheral pulses are palpable. Psych: Alert, awake and not in distress, CNS:  No cranial nerve deficits.  Moves all extremities. Skin: Warm and dry.  No rashes noted.  Data Reviewed:   CBC: Recent Labs  Lab 09/19/22 1844  WBC 8.3  HGB 11.0*  HCT 35.0*  MCV 81.0  PLT 255    Basic Metabolic Panel: Recent Labs  Lab 09/19/22 1844  NA 136  K 3.8  CL 104  CO2 23  GLUCOSE 125*  BUN 10  CREATININE 0.73  CALCIUM 8.5*  MG 1.7    Liver Function Tests: No results for input(s): "AST", "ALT", "ALKPHOS", "BILITOT", "PROT", "ALBUMIN" in the last 168 hours.   Radiology Studies: No results found.    LOS: 29 days    Joycelyn Das, MD [facility] Available via Epic secure chat 7am-7pm After these hours, please refer to coverage provider listed on [email or web address] 09/20/2022, 2:29 PM

## 2022-09-20 NOTE — Plan of Care (Signed)
  Problem: Safety: Goal: Ability to remain free from injury will improve Outcome: Progressing   

## 2022-09-21 DIAGNOSIS — F315 Bipolar disorder, current episode depressed, severe, with psychotic features: Secondary | ICD-10-CM | POA: Diagnosis not present

## 2022-09-21 NOTE — Care Management Important Message (Signed)
Important Message  Patient Details  Name: [name] MRN: [id number] Date of Birth: 12-Aug-1990   Medicare Important Message Given:  Yes     Sherilyn Banker 09/21/2022, 2:31 PM
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
## 2022-09-21 NOTE — Plan of Care (Signed)
  Problem: Education: Goal: Knowledge of General Education information will improve Description: Including pain rating scale, medication(s)/side effects and non-pharmacologic comfort measures Outcome: Progressing   Problem: Health Behavior/Discharge Planning: Goal: Ability to manage health-related needs will improve Outcome: Progressing   Problem: Clinical Measurements: Goal: Ability to maintain clinical measurements within normal limits will improve Outcome: Progressing Goal: Will remain free from infection Outcome: Progressing Goal: Diagnostic test results will improve Outcome: Progressing Goal: Cardiovascular complication will be avoided Outcome: Progressing   Problem: Activity: Goal: Risk for activity intolerance will decrease Outcome: Progressing   Problem: Coping: Goal: Level of anxiety will decrease Outcome: Progressing   Problem: Elimination: Goal: Will not experience complications related to bowel motility Outcome: Progressing   Problem: Pain Managment: Goal: General experience of comfort will improve Outcome: Progressing   Problem: Safety: Goal: Ability to remain free from injury will improve Outcome: Progressing   Problem: Skin Integrity: Goal: Risk for impaired skin integrity will decrease Outcome: Progressing   Problem: Role Relationship: Goal: Method of communication will improve Outcome: Progressing   Problem: Coping: Goal: Ability to adjust to condition or change in health will improve Outcome: Progressing   Problem: Fluid Volume: Goal: Ability to maintain a balanced intake and output will improve Outcome: Progressing   Problem: Health Behavior/Discharge Planning: Goal: Ability to identify and utilize available resources and services will improve Outcome: Progressing Goal: Ability to manage health-related needs will improve Outcome: Progressing   Problem: Metabolic: Goal: Ability to maintain appropriate glucose levels will  improve Outcome: Progressing   Problem: Nutritional: Goal: Maintenance of adequate nutrition will improve Outcome: Progressing Goal: Progress toward achieving an optimal weight will improve Outcome: Progressing   Problem: Skin Integrity: Goal: Risk for impaired skin integrity will decrease Outcome: Progressing   Problem: Tissue Perfusion: Goal: Adequacy of tissue perfusion will improve Outcome: Progressing

## 2022-09-22 DIAGNOSIS — F315 Bipolar disorder, current episode depressed, severe, with psychotic features: Secondary | ICD-10-CM | POA: Diagnosis not present

## 2022-09-22 NOTE — Plan of Care (Signed)
Pt is calm and cooperative throughout the night.   Problem: Education: Goal: Knowledge of General Education information will improve Description: Including pain rating scale, medication(s)/side effects and non-pharmacologic comfort measures Outcome: Progressing   Problem: Health Behavior/Discharge Planning: Goal: Ability to manage health-related needs will improve Outcome: Progressing   Problem: Clinical Measurements: Goal: Ability to maintain clinical measurements within normal limits will improve Outcome: Progressing   Problem: Activity: Goal: Risk for activity intolerance will decrease Outcome: Progressing   Problem: Coping: Goal: Level of anxiety will decrease Outcome: Progressing

## 2022-09-22 NOTE — Progress Notes (Signed)
Progress Note   Patient: [name] JXB:[id number] DOB: 04/21/1990 DOA: 08/22/2022     31 DOS: the patient was seen and examined on 09/22/2022   Brief hospital course: 32 y.o. female with PMH of adjustment disorder with mixed anxiety and depressed mood, autism, bipolar 1 disorder, fragile x syndrome, seizures not on meds, schizoaffective disorder and multiple suicide attempts in the last year  presented to the ED after ingesting numerous medications in a suicide attempt. She reported taking #30 metoprolol 25mg  tablets, #60 Hydroxyzine 25mg , #7 Lexapro 20mg , #21 Gabapentin 600mg , #7 Flexeril 10mg , #7 protonix 40mg  and #7 Prazosin 1mg . She was slightly bradycardic and hypotensive on EMS arrival which improved with fluids. Poison control was contacted and as she was within 2hrs of ingestion was given activated charcoal.  Patient was placed under IVC in ED. Poison control also recommended ICU admission for close Qtc and electrolyte monitoring and possible pressors.  She was intubated and started on vasopressor and glucagon. Also started on ceftriaxone for possible pneumonia. Patient was extubated on 7/18 and transferred to the floor on 7/19. She was transferred to [facility] service on 08/25/22. Psychiatry continues to follow. They recommend inpatient psychiatric hospitalization.  Awaiting placement  Assessment and Plan:  Suicidal Attempt with intentional overdose:  -Multiple medications including #30 metoprolol 25mg  tablets, #60 Hydroxyzine 25mg , #7 Lexapro 20mg , #21 Gabapentin 600mg , #7 Flexeril 10mg , #7 protonix 40mg  and #7 Prazosin 1mg .  Poison control for the patient generalized pain.  Received charcoal.  Initially required vasopressor glucagon intubation and mechanical ventilation.  Currently on IVC. Psychiatry placed APS referral as well.  Further care as per psychiatry.  On restraints and IM Zyprexa intermittently.  Cymbalta dose has been increased by psychiatry.   Anxiety and Depression,  Schizoaffective Disorder, Adjustment Disorder, bipolar disorder, fibromyalgia and auditory hallucination:  Improved hallucination. On Cogentin, Zyprexa, Atarax, prazosin and Lexapro at home.  Lexapro changed to Cymbalta. Zyprexa as needed and psychiatry recommending olanzapine 10 mg IM daily single dose as needed.  Intermittently has to be placed in 4-point restraints and given IM Zyprexa.   Continue one-to-one sitter at bedside.   Continue gabapentin 600 mg 3 times daily for agitated mood/anxiety Continues to Endorse Suicidal Thoughts and Ideation   CAP -treated  HTN Continue Lopressor.   Sinus Tachycardia Continue Lopressor.  Improved.  Metabolic Acidosis Improved.  Recent labs.  Check BMP.   Hypomagnesemia Improved.  Microcytic Anemia Iron deficiency anemia.  Low ferritin.  Continue oral iron.  History of Seizure Disorder -Stable.  Not on meds.   History of Asthma Continue nebulizers.  Stable.   Dysuria Was on catheter during ICU.   - recently treated with antibiotic.   Hypoalbuminemia Encourage oral intake.  Obesity Body mass index is 36.03 kg/m.  Would benefit from weight loss as outpatient.   Assessment and Plan: No notes have been filed under this hospital service. Service: Hospitalist  Subjective: reportedly calm overnight  Physical Exam: Vitals:   09/21/22 1407 09/21/22 1952 09/21/22 2014 09/22/22 0812  BP: 132/73 (!) 141/78    Pulse: (!) 109 100    Resp: 18 18    Temp: 99.2 F (37.3 C) 98.3 F (36.8 C)    TempSrc: Oral Oral    SpO2: 98% 100% 98% 99%  Weight:      Height:       In bed, NAD  Data Reviewed:  There are no new results to review at this time.  Family Communication:   Disposition: Status is: Inpatient  Remains inpatient appropriate because: Suicidality.  Needs inpatient psychiatry.  Planned Discharge Destination: Per psychiatry Time spent: 45 minutes.    Author: Joseph Art, DO 09/22/2022 12:09 PM  For on call review  [email or web address].   This document was prepared using Dragon voice recognition software and may contain some unintended transcription errors.

## 2022-09-23 DIAGNOSIS — F315 Bipolar disorder, current episode depressed, severe, with psychotic features: Secondary | ICD-10-CM | POA: Diagnosis not present

## 2022-09-23 NOTE — Progress Notes (Signed)
  Progress Note   Patient: [name] AVW:[id number] DOB: Sep 04, 1990 DOA: 08/22/2022     32 DOS: the patient was seen and examined on 09/23/2022   Brief hospital course: 32 y.o. female with PMH of adjustment disorder with mixed anxiety and depressed mood, autism, bipolar 1 disorder, fragile x syndrome, seizures not on meds, schizoaffective disorder and multiple suicide attempts in the last year  presented to the ED after ingesting numerous medications in a suicide attempt. She reported taking #30 metoprolol 25mg  tablets, #60 Hydroxyzine 25mg , #7 Lexapro 20mg , #21 Gabapentin 600mg , #7 Flexeril 10mg , #7 protonix 40mg  and #7 Prazosin 1mg . She was slightly bradycardic and hypotensive on EMS arrival which improved with fluids. Poison control was contacted and as she was within 2hrs of ingestion was given activated charcoal.  Patient was placed under IVC in ED. Poison control also recommended ICU admission for close Qtc and electrolyte monitoring and possible pressors.  She was intubated and started on vasopressor and glucagon. Also started on ceftriaxone for possible pneumonia. Patient was extubated on 7/18 and transferred to the floor on 7/19. She was transferred to [facility] service on 08/25/22. Psychiatry continues to follow. They recommend inpatient psychiatric hospitalization.  Awaiting placement  Assessment and Plan:  Suicidal Attempt with intentional overdose:  -Multiple medications including #30 metoprolol 25mg  tablets, #60 Hydroxyzine 25mg , #7 Lexapro 20mg , #21 Gabapentin 600mg , #7 Flexeril 10mg , #7 protonix 40mg  and #7 Prazosin 1mg .  Poison control for the patient generalized pain.  Received charcoal.  Initially required vasopressor glucagon intubation and mechanical ventilation.  Currently on IVC. Psychiatry placed APS referral as well.  Further care as per psychiatry.      Anxiety and Depression, Schizoaffective Disorder, Adjustment Disorder, bipolar disorder, fibromyalgia and auditory  hallucination:  Meds per psych Continue one-to-one sitter at bedside.      CAP -treated  HTN Continue Lopressor.   Sinus Tachycardia Continue Lopressor.  Improved.  Metabolic Acidosis Improved.  Recent labs.  Check BMP.   Hypomagnesemia Improved.  Microcytic Anemia Iron deficiency anemia.  Low ferritin.  Continue oral iron.  History of Seizure Disorder -Stable.  Not on meds.   History of Asthma Continue nebulizers.  Stable.   Dysuria Was on catheter during ICU.   - recently treated with antibiotic.   Hypoalbuminemia Encourage oral intake.  Obesity Body mass index is 36.03 kg/m.  Would benefit from weight loss as outpatient.   Assessment and Plan: No notes have been filed under this hospital service. Service: Hospitalist  Subjective: no overnight events  Physical Exam: Vitals:   09/21/22 2014 09/22/22 0812 09/22/22 2201 09/23/22 0701  BP:   138/69 115/81  Pulse:   98 (!) 108  Resp:   20 20  Temp:   98.4 F (36.9 C) 98.3 F (36.8 C)  TempSrc:   Oral Oral  SpO2: 98% 99% 99% 98%  Weight:      Height:       sleeping  Data Reviewed:  There are no new results to review at this time.  Family Communication:   Disposition: Status is: Inpatient Remains inpatient appropriate because: Suicidality.  Needs inpatient psychiatry.  Planned Discharge Destination: Per psychiatry Time spent: 45 minutes.    Author: Joseph Art, DO 09/23/2022 10:23 AM  For on call review [email or web address].   This document was prepared using Dragon voice recognition software and may contain some unintended transcription errors.

## 2022-09-23 NOTE — Progress Notes (Signed)
Summoned to pt's room by tech and RT, pt was crying stating that she did not want to take any more meds. She was saying that she is scared to take her meds because her parents keep telling her that she is crazy when they call her and they're angry at her. She states that what she did was wrong"overdosing" she feels unloved by her parents and feels anxious because she will spend her 32th birthday hospitalized without the people she cares for. Pt verbalized that she yearns to be reunited with her daughter, and yearns to make her parents proud. Comfort provided to patient, and ended up taking all her meds except Zyprexa she states that it made her feel weird.

## 2022-09-24 DIAGNOSIS — T1491XA Suicide attempt, initial encounter: Secondary | ICD-10-CM | POA: Diagnosis not present

## 2022-09-24 DIAGNOSIS — F411 Generalized anxiety disorder: Secondary | ICD-10-CM | POA: Diagnosis not present

## 2022-09-24 DIAGNOSIS — F315 Bipolar disorder, current episode depressed, severe, with psychotic features: Secondary | ICD-10-CM | POA: Diagnosis not present

## 2022-09-24 DIAGNOSIS — T50902A Poisoning by unspecified drugs, medicaments and biological substances, intentional self-harm, initial encounter: Secondary | ICD-10-CM | POA: Diagnosis not present

## 2022-09-24 MED ORDER — OLANZAPINE 5 MG PO TBDP
5.0000 mg | ORAL_TABLET | Freq: Every day | ORAL | Status: DC
  Administered 2022-09-24 – 2022-09-27 (×4): 5 mg via ORAL
  Filled 2022-09-24 (×4): qty 1

## 2022-09-24 NOTE — Progress Notes (Signed)
PROGRESS NOTE    [name]  AOZ:[id number] DOB: August 21, 1990 DOA: 08/22/2022 PCP: [facility]    Brief Narrative:  This 32 yrs old female with PMH significant of adjustment disorder with mixed anxiety and depressed mood, autism, bipolar 1 disorder, fragile x syndrome, seizures not on meds, schizoaffective disorder and multiple suicide attempts in the last year presented to the ED after ingesting numerous medications in a suicide attempt. She reported taking #30 metoprolol 25mg  tablets, #60 Hydroxyzine 25mg , #7 Lexapro 20mg , #21 Gabapentin 600mg , #7 Flexeril 10mg , #7 protonix 40mg  and #7 Prazosin 1mg . She was slightly bradycardic and hypotensive on EMS arrival which improved with fluids. Poison control was contacted and as she was within 2hrs of ingestion was given activated charcoal. Patient was placed under IVC in ED. Poison control also recommended ICU admission for close Qtc and electrolyte monitoring and possible pressors. She was intubated and started on vasopressor and glucagon. Also started on ceftriaxone for possible pneumonia. Patient was extubated on 7/18 and transferred to the floor on 7/19. She was transferred to [facility] service on 08/25/22. Psychiatry continues to follow. They recommend inpatient psychiatric hospitalization. Awaiting placement   Assessment & Plan:   Principal Problem:   Bipolar I disorder, current or most recent episode depressed, with psychotic features (HCC) Active Problems:   Asthma   GAD (generalized anxiety disorder)   Suicide attempt (HCC)   Overdose   Drug overdose   Suicidal Attempt with intentional overdose:  She has taken multiple medications including # 30 metoprolol 25mg  tablets, # 60 Hydroxyzine 25mg , # 7 Lexapro 20mg , # 21 Gabapentin 600mg , # 7 Flexeril 10mg , # 7 protonix 40mg  and # 7 Prazosin 1mg .  Poison control was contacted.   Received charcoal.  Initially required vasopressor,  glucagon,  intubation and mechanical ventilation.   Currently on IVC. Psychiatry placed APS referral as well.  Further care as per psychiatry.       Anxiety and Depression, Schizoaffective Disorder, Adjustment Disorder, bipolar disorder, fibromyalgia and auditory hallucination:  Meds per psych. Continue one-to-one sitter at bedside.       Community-acquired pneumonia: Completed antibiotic treatment for community-acquired pneumonia.   HTN: Continue Lopressor.   Sinus Tachycardia: Continue Lopressor.  Improved.   Metabolic Acidosis: Improved.  Recent labs.  Check BMP.   Hypomagnesemia: Improved.   Microcytic Anemia: Iron deficiency anemia.  Low ferritin.  Continue oral iron.   History of Seizure Disorder: -Stable.  Not on meds.   History of Asthma: Continue nebulizers.  Stable.   Dysuria: She was  catheterized during ICU.   - recently treated with antibiotics.   Hypoalbuminemia: Encourage oral intake.   Obesity: Body mass index is 36.03 kg/m.  Would benefit from weight loss as outpatient.   DVT prophylaxis: SCDs Code Status: Full code Family Communication:No family at bed side. Disposition Plan:   Status is: Inpatient Remains inpatient appropriate because: Admitted for suicidal ideations,  evaluated by psychiatry. Patient is awaiting psych inpatient hospitalization.   Consultants:  Psychiatry  Procedures: None.  Antimicrobials:  Anti-infectives (From admission, onward)    Start     Dose/Rate Route Frequency Ordered Stop   08/26/22 1415  amoxicillin-clavulanate (AUGMENTIN) 875-125 MG per tablet 1 tablet        1 tablet Oral Every 12 hours 08/26/22 1316 08/27/22 2205   08/23/22 1200  cefTRIAXone (ROCEPHIN) 2 g in sodium chloride 0.9 % 100 mL IVPB  Status:  Discontinued        2 g 200 mL/hr over  30 Minutes Intravenous Every 24 hours 08/23/22 0931 08/26/22 1316      Subjective: Patient was seen and examined at bedside.  Overnight events noted. She states she does not feel happy,  reports having a bad  day. She has intermittent episodes where she has feelings about hurting herself  Objective: Vitals:   09/23/22 0701 09/23/22 1843 09/24/22 0804 09/24/22 1514  BP: 115/81 130/63  130/74  Pulse: (!) 108 (!) 118 (!) 102 100  Resp: 20 16 16 18   Temp: 98.3 F (36.8 C) 98.3 F (36.8 C)  98.3 F (36.8 C)  TempSrc: Oral Oral  Oral  SpO2: 98% 99% 98% 96%  Weight:      Height:        Intake/Output Summary (Last 24 hours) at 09/24/2022 1617 Last data filed at 09/24/2022 1128 Gross per 24 hour  Intake 1060 ml  Output 800 ml  Net 260 ml   Filed Weights   09/01/22 0300 09/02/22 0500 09/03/22 0402  Weight: 98.8 kg 99 kg 98.2 kg    Examination:  General exam: Appears calm and comfortable, not in any acute distress. Respiratory system: Clear to auscultation. Respiratory effort normal.  RR 16 Cardiovascular system: S1 & S2 heard, RRR. No JVD, murmurs, rubs, gallops or clicks. No pedal edema. Gastrointestinal system: Abdomen is nondistended, soft and nontender. Normal bowel sounds heard. Central nervous system: Alert and oriented x 3. No focal neurological deficits. Extremities: Symmetric 5 x 5 power.  No edema, no cyanosis, no clubbing. Skin: No rashes, lesions or ulcers Psychiatry: Judgement and insight appear normal. Mood & affect appropriate.     Data Reviewed: I have personally reviewed following labs and imaging studies  CBC: Recent Labs  Lab 09/19/22 1844  WBC 8.3  HGB 11.0*  HCT 35.0*  MCV 81.0  PLT 255   Basic Metabolic Panel: Recent Labs  Lab 09/19/22 1844  NA 136  K 3.8  CL 104  CO2 23  GLUCOSE 125*  BUN 10  CREATININE 0.73  CALCIUM 8.5*  MG 1.7   GFR: Estimated Creatinine Clearance: 116.1 mL/min (by C-G formula based on SCr of 0.73 mg/dL). Liver Function Tests: No results for input(s): "AST", "ALT", "ALKPHOS", "BILITOT", "PROT", "ALBUMIN" in the last 168 hours. No results for input(s): "LIPASE", "AMYLASE" in the last 168 hours. No results for  input(s): "AMMONIA" in the last 168 hours. Coagulation Profile: No results for input(s): "INR", "PROTIME" in the last 168 hours. Cardiac Enzymes: No results for input(s): "CKTOTAL", "CKMB", "CKMBINDEX", "TROPONINI" in the last 168 hours. BNP (last 3 results) No results for input(s): "PROBNP" in the last 8760 hours. HbA1C: No results for input(s): "HGBA1C" in the last 72 hours. CBG: No results for input(s): "GLUCAP" in the last 168 hours. Lipid Profile: No results for input(s): "CHOL", "HDL", "LDLCALC", "TRIG", "CHOLHDL", "LDLDIRECT" in the last 72 hours. Thyroid Function Tests: No results for input(s): "TSH", "T4TOTAL", "FREET4", "T3FREE", "THYROIDAB" in the last 72 hours. Anemia Panel: No results for input(s): "VITAMINB12", "FOLATE", "FERRITIN", "TIBC", "IRON", "RETICCTPCT" in the last 72 hours. Sepsis Labs: No results for input(s): "PROCALCITON", "LATICACIDVEN" in the last 168 hours.  No results found for this or any previous visit (from the past 240 hour(s)).   Radiology Studies: No results found.  Scheduled Meds:  cyclobenzaprine  10 mg Oral QHS   diclofenac Sodium  2 g Topical QID   DULoxetine  60 mg Oral Daily   ferrous sulfate  325 mg Oral BID WC   gabapentin  600  mg Oral TID   mometasone-formoterol  2 puff Inhalation BID   mupirocin cream   Topical Daily   OLANZapine  15 mg Oral Daily   OLANZapine zydis  5 mg Oral Q1200   pantoprazole  40 mg Oral QAC breakfast   prazosin  1 mg Oral QHS   sodium bicarbonate  650 mg Oral TID   Continuous Infusions:  sodium chloride Stopped (08/24/22 1347)     LOS: 33 days    Time spent: 50 mins    Willeen Niece, MD [facility]   If 7PM-7AM, please contact night-coverage

## 2022-09-24 NOTE — TOC Progression Note (Signed)
Transition of Care [facility]) - Progression Note    Patient Details  Name: [name] MRN: [id number] Date of Birth: 20-May-1990  [facility] of [facility]) CM/SW Contact  Lorri Frederick, LCSW Phone Number: 09/24/2022, 1:47 PM  Clinical Narrative:   New notes faxed to [facility].  1345: Robynn Pane, [facility].  Pt remains on priority wait list.  There is no update on timeframe for available bed.  She cannot share where pt is on the list currently.  She did get the notes faxed earlier today.        Expected Discharge Plan and Services                                               Social Determinants of Health (SDOH) Interventions SDOH Screenings   Food Insecurity: No Food Insecurity (07/29/2022)  Housing: Low Risk  (07/29/2022)  Transportation Needs: No Transportation Needs (07/29/2022)  Recent Concern: Transportation Needs - Unmet Transportation Needs (05/06/2022)  Utilities: Not At Risk (07/29/2022)  Alcohol Screen: Low Risk  (07/29/2022)  Depression (PHQ2-9): Medium Risk (03/10/2022)  Tobacco Use: Medium Risk (09/07/2022)    Readmission Risk Interventions    06/06/2022    3:38 PM  Readmission Risk Prevention Plan  Transportation Screening Complete  Medication Review (RN Care Manager) Complete  PCP or Specialist appointment within 3-5 days of discharge Complete  HRI or Home Care Consult Complete  SW Recovery Care/Counseling Consult Complete  Palliative Care Screening Not Applicable  Skilled Nursing Facility Not Applicable

## 2022-09-24 NOTE — Plan of Care (Signed)
  Problem: Clinical Measurements: Goal: Ability to maintain clinical measurements within normal limits will improve Outcome: Progressing Goal: Will remain free from infection Outcome: Progressing Goal: Diagnostic test results will improve Outcome: Progressing Goal: Cardiovascular complication will be avoided Outcome: Progressing   Problem: Elimination: Goal: Will not experience complications related to bowel motility Outcome: Progressing   Problem: Pain Managment: Goal: General experience of comfort will improve Outcome: Progressing   Problem: Skin Integrity: Goal: Risk for impaired skin integrity will decrease Outcome: Progressing   Problem: Metabolic: Goal: Ability to maintain appropriate glucose levels will improve Outcome: Progressing   Problem: Skin Integrity: Goal: Risk for impaired skin integrity will decrease Outcome: Progressing   Problem: Tissue Perfusion: Goal: Adequacy of tissue perfusion will improve Outcome: Progressing

## 2022-09-24 NOTE — Progress Notes (Signed)
Paged by sitter d/t patient going into her bathroom attempting to break soap dispenser, her right middle finger was bleeding no acute injury noted - just a small cut. Pt was crying stating that she was trying to kill herself and was hoping to slit her wrist out of frustration of being here. Bedside commode was placed outside bathroom and was encouraged to use that instead of using the bathroom, PT was crying and was yelling at staff stating that she was hated by staff. Pt requested pen and paper to write down her feelings down, and I told her I was unable to provide her with a pen d/t her high risk of self harm. On call provider was notified. No new orders, just monitoring.

## 2022-09-24 NOTE — Progress Notes (Signed)
Pt requesting to speak w/ chaplin.

## 2022-09-24 NOTE — Plan of Care (Signed)
  Problem: Coping: Goal: Ability to adjust to condition or change in health will improve Outcome: Progressing   

## 2022-09-24 NOTE — Consult Note (Signed)
[name] Psychiatry Consult Evaluation  Service Date: September 24, 2022 LOS:  LOS: 33 days    Primary Psychiatric Diagnoses   Schizoaffective disorder, bipolar type  GAD  Borderline personality disorder  Assessment  [name] is a 32 y.o. female with a past psychiatric history of borderline personality disorder, schizoaffective disorder bipolar type, and PTSD.  She has had numerous behavioral health admissions and an extensive history of suicide attempts.  Patient presents to [facility] ED for an attempted overdose on her prescription medications.  Briefly, this pt has had ~50 ED visits or psych hospitalizations during 2024; this continues a pattern of an inability to maintain safety in the outpatient setting going back multiple years. The vast majority of her suicide attempts have been via overdose on psychotropic medication. She has failed multiple levels of outpatient management up to and including CST; she has been declined by multiple psychiatric facilities due to futility, and she has been discharged by her CST team as of 8/8. She is currently on the [facility] priority wait list. She is not eligible for ACT services through [facility] due to BPD diagnosis.   We are trying to simplify patient's medication regimen to avoid polypharmacy especially in the setting of her recent overdose. Earlier in hospitalization during a period of improvement (roughly 8/1 to 8/8), we discussed medication management if and when she does return home including closer monitoring, less access to her medications through a locked cabinet, and her ex-boyfriend being more in charge of her medications in which was confirmed with her ex-boyfriend before being dropped by her CST.  After notification of discontinuation from CST services and ineligibility for ACT services, patient had acute worsening of reported hallucinations, suicidality, homicidality and agitation (fear of abandonment is central feature in BPD) and  consideration of community f/u was terminated. Patient has an extensive history of ER visits and hospitalizations in which she has displayed aggression, psychosis, and danger to self even while in proximity of a Recruitment consultant. Patient also shows high level of lethality, intensity, and frequency of suicidal attempts with multiple inpatient psychiatric admissions with little benefit so we continue to recommend [facility]; given wait list will also refer to other psychiatric hospitals. Patient is currently on the [facility] priority wait list and we are working closely with LSW with bed availability.   8/19: worsening suicidal thoughts with plan, intent and action of self harm last night when broke soap dispenser off the wall and attempting to use broken plastic to cut herself.  Patient voices frustration of being unable to use her coping strategies because her actions have caused her to lose privileges/access to use them (i.e.- showers, phone calls, markers)  Diagnoses:  Active Hospital problems: Principal Problem:   Bipolar I disorder, current or most recent episode depressed, with psychotic features (HCC) Active Problems:   Asthma   GAD (generalized anxiety disorder)   Suicide attempt (HCC)   Overdose   Drug overdose    Plan   ## Psychiatric Medication Recommendations:  -- continue  Zyprexa to 15 mg po nightly for psychosis  -- Continue Cymbalta 60 mg daily -- Continue Gabapentin 600 mg TID for agitation, mood/anxiety -- continue prazosin 1 mg at bedtime PTSD -- Start Zyprexa 5 mg PO daily at noon for mood stabilization.   Agitation Protocol -- PRN Zyprexa 10 mg IM for agitation -- PRN Ativan 0.5 mg oral BID PRN for agitation -- Change PRN Zyprexa zydis 5 mg BID for agitation to a scheduled dose  ##  Medical Decision Making Capacity:   Capacity was not formally addressed during this encounter  ## Further Work-up:  -- Per primary -- most recent EKG on 08/30/2022 had QtC of 469  ## Disposition:   -- We recommend inpatient psychiatric hospitalization after medical hospitalization. Patient has been involuntarily committed on 09/04/2022.  -- IVC renewed on 8/14 -- Patient remains on [facility] priority wait list  ## Behavioral / Environmental:  --  To minimize splitting of staff, assign one staff person to communicate all information from the team when feasible. -- Apply the established behavioral rules and protocols consistently throughout hospitalization (see nursing care order for details). Ensure that all staff members are aware of and adhere to the same guidelines to avoid confusion and mixed messages.  ## Safety and Observation Level:  - Based on my clinical evaluation, I estimate the patient to be at elevated risk of self harm in the current setting - At this time, we recommend continuing a 1:1 level of observation. This decision is based on my review of the chart including patient's history and current presentation, interview of the patient, mental status examination, and consideration of suicide risk including evaluating suicidal ideation, plan, intent, suicidal or self-harm behaviors, risk factors, and protective factors. This judgment is based on our ability to directly address suicide risk, implement suicide prevention strategies and develop a safety plan while the patient is in the clinical setting. Please contact our team if there is a concern that risk level has changed.  Suicide risk assessment  Patient has following modifiable risk factors for suicide: active suicidal ideation, under treated depression , recklessness, and lack of access to outpatient mental health resources, which we are addressing by continuing adequate safety precautions while she is hospitalized and adjusting medications   Patient has following non-modifiable or demographic risk factors for suicide: early widowhood, history of suicide attempt, history of self harm behavior, and psychiatric  hospitalization  Patient has the following protective factors against suicide: Supportive friends  Thank you for this consult request. Recommendations have been communicated to the primary team.  We will follow at this time.    Mariel Craft, MD   Total Time Spent in Direct Patient Care:  I personally spent 40 minutes on the unit in direct patient care. The direct patient care time included face-to-face time with the patient, reviewing the patient's chart, communicating with other professionals, and coordinating care. Greater than 50% of this time was spent in counseling or coordinating care with the patient regarding goals of hospitalization, psycho-education, and discharge planning needs.    Psychiatric and Social History   Relevant Aspects of Hospital Course:  Admitted on 08/22/2022 for suicide attempt by overdose on her prescription medication.   Patient Report:  [name] this afternoon. She endorses worsening suicidal ideations due to inability to use coping mechanisms and feeling like she is being challenged by staff to "do it, when I tell them I am thinking about hurting myself, which is why I broke off the soap dispenser."  She feels frustrated that she can not use coping strategies because of restrictions that have been placed as consequences to her behaviors.  Patient is able to acknowledge accountability for her part/ how behaviors have impacted her hospital stay.  She continues to blame her parents for her actions, noting auditory hallucinations.  She denies HI.  Patient reports that going for walks helps distract her.  She is encouraged to walk with her sitter, but declines. She does request to  have Zyprexa 5 mg scheduled instead of PRN. She states that the 15 mg evening dose is effective through the morning.  She states she begins to have more self-harm thoughts afternoon and requests to have a scheduled dose of Zyprexa at the noon time, and notes that this does not make her  sleepy.  Agree with plan, and will continue to monitor.  Patient also complains of intermittent heart palpitations.  She states she previously had been taking metoprolol and did not think that she was continuing to get this.  Review of her more confirms she has not been receiving this. I have communicated with primary treatment team regarding restarting metoprolol and consideration for PT/OT scheduled for patient to provide her with activities during the day to decrease her mental distress.  Collateral 8/14: called James, no response 8/12: tried to call [name] with no response.   8/9 Called Debra with CST - stated she would be happy to conduct a termination session with [name] if she reached out . She has not been called by [name] during this hospitalization.  8/9 Called Dr. Pershing Proud with [facility] to discuss pt's recent decompensation    [employer] in to note above   Past Psychiatric History: Previous Psych Diagnoses: Schizoaffective disorder bipolar type, Anxiety, Autism spectrum disorder, PTSD, Borderline personality disorder Prior inpatient treatment: multiple, 15 ED visits and 4 inpatient admissions since 06/2022 (~50 total in 2024), admitted to [facility] from 07/29/22-08/02/2022, has been to [facility] for eight months in 2016, and was discharged from inpatient facility past week Current/prior outpatient treatment: [facility], CST team twice a week Prior rehab hx: denies multiple Psychotherapy hx: [facility] History of suicide: 20-30 times by overdosing on pills, cutting or drinking 409 disinfectant History of homicide or aggression: denies Psychiatric medication history:  Previously trials of Tegretol, Haldol, Clozaril, Depakote, Invega, Lamictal, Seroquel, Asenapine, Abilify, vraylar (worsened suicidal thoughts), Lexapro, Zoloft  Neuromodulation history: denies  Social History:  Marital status: Widowed Children: 64 year old daughter Lives: with ex-boyfriend [name], in two  story home Employment: Disability, 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  Access to weapons: None   Exam Findings   Psychiatric Specialty Exam:  Presentation  General Appearance: Disheveled  Eye Contact:Minimal  Speech:Normal Rate  Speech Volume:Decreased  Handedness:Right   Mood and Affect  Mood:-- "Okay"  Affect:-- congruent, restrictive   Thought Process  Thought Processes:Goal Directed  Descriptions of Associations:Intact  Orientation:Full (Time, Place and Person)  Thought Content:Rumination  History of Schizophrenia/Schizoaffective disorder: Yes  Duration of Psychotic Symptoms:Greater than six months  Hallucinations: hearing her parents' voice  Ideas of Reference:Paranoia  Suicidal Thoughts: yes- with intent, plan and means to harm self last night  Homicidal Thoughts: Denies   Sensorium  Memory:Immediate Good; Recent Good; Remote Good  Judgment:Poor  Insight: Fair   Art therapist  Concentration:Fair  Attention Span:Fair  Recall:Fair  Fund of Knowledge:Fair  Language:Fair   Psychomotor Activity  Psychomotor Activity: decreased  Assets  Assets:Desire for Improvement; Communication Skills   Sleep  Sleep: Fair    Physical Exam: Vital signs:  Temp:  [98.3 F (36.8 C)] 98.3 F (36.8 C) (08/19 1514) Pulse Rate:  [100-118] 100 (08/19 1514) Resp:  [16-18] 18 (08/19 1514) BP: (130)/(63-74) 130/74 (08/19 1514) SpO2:  [96 %-99 %] 96 % (08/19 1514) Physical Exam Vitals and nursing note reviewed. Exam conducted with a chaperone present.  Constitutional:      General: She is  not in acute distress.    Appearance: She is obese. She is not ill-appearing.  HENT:     Head: Normocephalic.  Cardiovascular:     Rate and Rhythm: Normal rate.  Pulmonary:     Effort: Pulmonary effort is normal. No respiratory distress.  Neurological:     General: No focal deficit present.      Mental Status: She is alert.  Psychiatric:        Attention and Perception: Attention normal.        Speech: Speech normal.     Blood pressure 130/74, pulse 100, temperature 98.3 F (36.8 C), temperature source Oral, resp. rate 18, height 5\' 5"  (1.651 m), weight 98.2 kg, last menstrual period 08/08/2022, SpO2 96%. Body mass index is 36.03 kg/m.   Other History   These have been pulled in through the EMR, reviewed, and updated if appropriate.   Family History:  The patient's family history includes Asthma in her father; Mental illness in her father; PDD in her brother; Seizures in her brother.  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    Chronic constipation    Depression    Drug-seeking behavior    Essential tremor    Headache    Ineffective individual coping 05/16/2022   Insomnia 01/12/2022   Intentional drug overdose (HCC) 06/05/2022   Neuromuscular disorder (HCC)    Normocytic anemia 06/05/2022   Overdose 07/22/2017   Overdose of acetaminophen 07/2017   and other meds   Overdose, intentional self-harm, initial encounter (HCC) 07/20/2021   Paranoia (HCC) 04/22/2021   Personality disorder (HCC)    Purposeful non-suicidal drug ingestion (HCC) 06/27/2021   Schizo-affective psychosis (HCC)    Schizoaffective disorder (HCC) 07/29/2022   Schizoaffective disorder, bipolar type (HCC)    Seizures (HCC)    Last seizure December 2017   Skin erythema 04/27/2022   Sleep apnea    Suicidal behavior 07/25/2021   Suicidal ideation    Suicide (HCC) 07/01/2021   Suicide attempt (HCC) 07/04/2021    Surgical History: Past Surgical History:   Procedure Laterality Date   MOUTH SURGERY  2009 or 2010    Medications:   Current Facility-Administered Medications:    0.9 %  sodium chloride infusion, 250 mL, Intravenous, Continuous, Gonfa, Taye T, MD, Stopped at 08/24/22 1347   albuterol (PROVENTIL) (2.5 MG/3ML) 0.083% nebulizer solution 3 mL, 3 mL, Inhalation, Q6H PRN, Alanda Slim, Taye T, MD   cyclobenzaprine (FLEXERIL) tablet 10 mg, 10 mg, Oral, QHS, Gonfa, Taye T, MD, 10 mg at 09/23/22 2058   diclofenac Sodium (VOLTAREN) 1 % topical gel 2 g, 2 g, Topical, QID, Gonfa, Taye T, MD, 2 g at 09/24/22 1332   DULoxetine (CYMBALTA) DR capsule 60 mg, 60 mg, Oral, Daily, Akintayo, Mojeed, MD, 60 mg at 09/24/22 0941   ferrous sulfate tablet 325 mg, 325 mg, Oral, BID WC, Gonfa, Taye T, MD, 325 mg at 09/24/22 1613   gabapentin (NEURONTIN) capsule 600 mg, 600 mg, Oral, TID, Alanda Slim, Taye T, MD, 600 mg at 09/24/22 1613   hydrOXYzine (ATARAX) tablet 25 mg, 25 mg, Oral, TID PRN, Almon Hercules, MD,  25 mg at 09/24/22 1613   ibuprofen (ADVIL) tablet 600 mg, 600 mg, Oral, TID PRN, Alanda Slim, Taye T, MD, 600 mg at 09/24/22 1336   LORazepam (ATIVAN) tablet 0.5 mg, 0.5 mg, Oral, BID PRN, Alanda Slim, Taye T, MD, 0.5 mg at 09/24/22 0941   mometasone-formoterol (DULERA) 200-5 MCG/ACT inhaler 2 puff, 2 puff, Inhalation, BID, Alanda Slim, Taye T, MD, 2 puff at 09/24/22 0804   mupirocin cream (BACTROBAN) 2 %, , Topical, Daily, Marguerita Merles Oceana, DO, Given at 09/21/22 0903   OLANZapine (ZYPREXA) injection 10 mg, 10 mg, Intramuscular, Once PRN, Akintayo, Mojeed, MD   OLANZapine (ZYPREXA) tablet 15 mg, 15 mg, Oral, Daily, Cinderella, Margaret A, 15 mg at 09/22/22 2009   OLANZapine zydis (ZYPREXA) disintegrating tablet 5 mg, 5 mg, Oral, Q1200, Mariel Craft, MD, 5 mg at 09/24/22 1332   ondansetron (ZOFRAN-ODT) disintegrating tablet 4 mg, 4 mg, Oral, Q8H PRN, Sheikh, Omair Latif, DO, 4 mg at 09/17/22 1700   Oral care mouth rinse, 15 mL, Mouth Rinse, PRN, Osvaldo Shipper, MD    pantoprazole (PROTONIX) EC tablet 40 mg, 40 mg, Oral, QAC breakfast, Gonfa, Taye T, MD, 40 mg at 09/24/22 0941   polyethylene glycol (MIRALAX / GLYCOLAX) packet 17 g, 17 g, Oral, BID PRN, Alanda Slim, Taye T, MD, 17 g at 09/24/22 1336   prazosin (MINIPRESS) capsule 1 mg, 1 mg, Oral, QHS, Gonfa, Taye T, MD, 1 mg at 09/23/22 2112   senna-docusate (Senokot-S) tablet 1 tablet, 1 tablet, Oral, BID PRN, Candelaria Stagers T, MD, 1 tablet at 09/24/22 1336   sodium bicarbonate tablet 650 mg, 650 mg, Oral, TID, Candelaria Stagers T, MD, 650 mg at 09/24/22 1613  Allergies: Allergies  Allergen Reactions   Bee Venom Anaphylaxis   Coconut Flavor Anaphylaxis and Rash   Fish Allergy Anaphylaxis   Geodon [Ziprasidone Hcl] Other (See Comments)    Pt states that this medication causes paralysis of the mouth.     Haloperidol And Related Other (See Comments)    Pt states that this medication causes paralysis of the mouth, jaw locks up   Lithobid [Lithium] Other (See Comments)    Seizure-like activity    Roxicodone [Oxycodone] Other (See Comments)    Hallucinations    Seroquel [Quetiapine] Other (See Comments)    Severe drowsiness - Pt currently taking 12.5 mg BID, she is ok with this dose   Shellfish Allergy Anaphylaxis   Phenergan [Promethazine Hcl] Other (See Comments)    Chest pain     Prilosec [Omeprazole] Nausea And Vomiting and Other (See Comments)    Pt can take protonix with no problems    Sulfa Antibiotics Other (See Comments)    Chest pain    Tegretol [Carbamazepine] Nausea And Vomiting   Prozac [Fluoxetine] Other (See Comments)    Increased Depression and Suicidal thoughts   Tape Other (See Comments)    Skin tears, can only tolerate paper tape.   Tylenol [Acetaminophen] Rash and Other (See Comments)    Rash on face

## 2022-09-24 NOTE — Progress Notes (Signed)
   09/24/22 1139  Spiritual Encounters  Type of Visit Initial  Care provided to: Patient  Referral source Patient request  Reason for visit Routine spiritual support  OnCall Visit No  Spiritual Framework  Presenting Themes Impactful experiences and emotions;Community and relationships;Significant life change  Community/Connection Family  Needs/Challenges/Barriers patient is having trouble processing her anger  Patient Stress Factors Family relationships;Major life changes  Family Stress Factors Not reviewed  Interventions  Spiritual Care Interventions Made Established relationship of care and support;Compassionate presence;Reflective listening;Normalization of emotions;Encouragement  Intervention Outcomes  Outcomes Connection to spiritual care;Awareness of support;Reduced isolation  Spiritual Care Plan  Spiritual Care Issues Still Outstanding No further spiritual care needs at this time (see row info)  Recommendations for Clinical Staff Chaplain support is avialable when reqested by patient  Follow up plan  chaplain will make team aware of the patient and her needs, no immediate follow up at this time unless requested by the patient.   Chaplain responded to a patient request for spiritual care. Patient is struggling spiritually with forgiveness. Patient is frustrated by and feels neglected by her parents. Chaplain worked with patient on dealing with her anger. Chaplain explored with the patient to find healthy ways to express/process her anger. Patient stated she has lashed out at staff including physical violence. Patient feels she has no hope. Patient stated that her goal/need at this time is to find short term behavioral health placement.   Arlyce Dice, Chaplain Resident [phone]

## 2022-09-25 DIAGNOSIS — F315 Bipolar disorder, current episode depressed, severe, with psychotic features: Secondary | ICD-10-CM | POA: Diagnosis not present

## 2022-09-25 LAB — CBC
HCT: 38.2 % (ref 36.0–46.0)
Hemoglobin: 11.8 g/dL — ABNORMAL LOW (ref 12.0–15.0)
MCH: 24.7 pg — ABNORMAL LOW (ref 26.0–34.0)
MCHC: 30.9 g/dL (ref 30.0–36.0)
MCV: 80.1 fL (ref 80.0–100.0)
Platelets: 263 10*3/uL (ref 150–400)
RBC: 4.77 MIL/uL (ref 3.87–5.11)
RDW: 16.6 % — ABNORMAL HIGH (ref 11.5–15.5)
WBC: 8.4 10*3/uL (ref 4.0–10.5)
nRBC: 0 % (ref 0.0–0.2)

## 2022-09-25 LAB — COMPREHENSIVE METABOLIC PANEL
ALT: 19 U/L (ref 0–44)
AST: 16 U/L (ref 15–41)
Albumin: 3.6 g/dL (ref 3.5–5.0)
Alkaline Phosphatase: 68 U/L (ref 38–126)
Anion gap: 12 (ref 5–15)
BUN: 11 mg/dL (ref 6–20)
CO2: 22 mmol/L (ref 22–32)
Calcium: 8.9 mg/dL (ref 8.9–10.3)
Chloride: 103 mmol/L (ref 98–111)
Creatinine, Ser: 0.67 mg/dL (ref 0.44–1.00)
GFR, Estimated: >60 mL/min (ref >60)
Glucose, Bld: 94 mg/dL (ref 70–99)
Potassium: 3.6 mmol/L (ref 3.5–5.1)
Sodium: 137 mmol/L (ref 135–145)
Total Bilirubin: 0.3 mg/dL (ref 0.3–1.2)
Total Protein: 6.9 g/dL (ref 6.5–8.1)

## 2022-09-25 LAB — MAGNESIUM: Magnesium: 1.7 mg/dL (ref 1.7–2.4)

## 2022-09-25 LAB — PHOSPHORUS: Phosphorus: 4.2 mg/dL (ref 2.5–4.6)

## 2022-09-25 NOTE — Plan of Care (Signed)
  Problem: Education: Goal: Knowledge of General Education information will improve Description: Including pain rating scale, medication(s)/side effects and non-pharmacologic comfort measures Outcome: Not Progressing   Problem: Health Behavior/Discharge Planning: Goal: Ability to manage health-related needs will improve Outcome: Not Progressing   Problem: Clinical Measurements: Goal: Ability to maintain clinical measurements within normal limits will improve Outcome: Not Progressing Goal: Will remain free from infection Outcome: Not Progressing Goal: Diagnostic test results will improve Outcome: Not Progressing Goal: Cardiovascular complication will be avoided Outcome: Not Progressing   Problem: Pain Managment: Goal: General experience of comfort will improve Outcome: Not Progressing   Problem: Skin Integrity: Goal: Risk for impaired skin integrity will decrease Outcome: Not Progressing   Problem: Safety: Goal: Ability to remain free from injury will improve Outcome: Not Progressing

## 2022-09-25 NOTE — Consult Note (Signed)
[name] Consult Evaluation  Service Date: September 25, 2022 LOS:  LOS: 34 days    Primary Psychiatric Diagnoses   Schizoaffective disorder, bipolar type  GAD  Borderline personality disorder  Assessment  [name] is a 32 y.o. female with a past psychiatric history of borderline personality disorder, schizoaffective disorder bipolar type, and PTSD.  She has had numerous behavioral health admissions and an extensive history of suicide attempts.  Patient presents to [facility] ED for an attempted overdose on her prescription medications.  Briefly, this pt has had ~50 ED visits or psych hospitalizations during 2024; this continues a pattern of an inability to maintain safety in the outpatient setting going back multiple years. The vast majority of her suicide attempts have been via overdose on psychotropic medication. She has failed multiple levels of outpatient management up to and including CST; she has been declined by multiple psychiatric facilities due to futility, and she has been discharged by her CST team as of 8/8. She is currently on the [facility] priority wait list. She is not eligible for ACT services through [facility] due to BPD diagnosis.   We are trying to simplify patient's medication regimen to avoid polypharmacy especially in the setting of her recent overdose. Earlier in hospitalization during a period of improvement (roughly 8/1 to 8/8), we discussed medication management if and when she does return home including closer monitoring, less access to her medications through a locked cabinet, and her ex-boyfriend being more in charge of her medications in which was confirmed with her ex-boyfriend before being dropped by her CST.  After notification of discontinuation from CST services and ineligibility for ACT services, patient had acute worsening of reported hallucinations, suicidality, homicidality and agitation (fear of abandonment is central feature in BPD) and  consideration of community f/u was terminated. Patient has an extensive history of ER visits and hospitalizations in which she has displayed aggression, psychosis, and danger to self even while in proximity of a Recruitment consultant. Patient also shows high level of lethality, intensity, and frequency of suicidal attempts with multiple inpatient psychiatric admissions with little benefit so we continue to recommend [facility]; given wait list will also refer to other psychiatric hospitals. Patient is currently on the [facility] priority wait list and we are working closely with LSW with bed availability.   8/20: Patient remains guarded and irritable on assessment. In the past few days, patient reports SI with self harm behavior of breaking soap dispenser to cut herself along with denying vitals due to paranoia of the staff wanting to harm her. This is in the setting of learning that her CST team is no longer able to see her in the outpatient setting. Today, she is denying SI and voices her frustration of being in the hospital. We will reinforce consequences like restriction of activities of behavior concerning for safety. We will continue the current medications and continue to followup on [facility] availability.   Diagnoses:  Active Hospital problems: Principal Problem:   Bipolar I disorder, current or most recent episode depressed, with psychotic features (HCC) Active Problems:   Asthma   GAD (generalized anxiety disorder)   Suicide attempt (HCC)   Overdose   Drug overdose    Plan   ## Psychiatric Medication Recommendations:  -- Continue Zyprexa 15 mg po nightly for psychosis  -- Continue Zyprexa 5 mg PO daily at noon for mood stabilization -- Continue Cymbalta 60 mg daily -- Continue Gabapentin 600 mg TID for agitation, mood/anxiety -- continue  prazosin 1 mg at bedtime for PTSD  Agitation Protocol -- PRN Zyprexa 10 mg IM for agitation -- PRN Ativan 0.5 mg oral BID PRN for agitation -- PRN Atarax 25 mg TID for  anxiety, agitation  ## Medical Decision Making Capacity:   Capacity was not formally addressed during this encounter  ## Further Work-up:  -- Per primary -- most recent EKG on 08/30/2022 had QtC of 469  ## Disposition:  -- We recommend inpatient psychiatric hospitalization after medical hospitalization. Patient has been involuntarily committed on 09/04/2022.  -- IVC renewed on 8/14 -- Patient remains on [facility] priority wait list  ## Behavioral / Environmental:  --  To minimize splitting of staff, assign one staff person to communicate all information from the team when feasible. -- Apply the established behavioral rules and protocols consistently throughout hospitalization (see nursing care order for details). Ensure that all staff members are aware of and adhere to the same guidelines to avoid confusion and mixed messages.  ## Safety and Observation Level:  - Based on my clinical evaluation, I estimate the patient to be at elevated risk of self harm in the current setting - At this time, we recommend continuing a 1:1 level of observation. This decision is based on my review of the chart including patient's history and current presentation, interview of the patient, mental status examination, and consideration of suicide risk including evaluating suicidal ideation, plan, intent, suicidal or self-harm behaviors, risk factors, and protective factors. This judgment is based on our ability to directly address suicide risk, implement suicide prevention strategies and develop a safety plan while the patient is in the clinical setting. Please contact our team if there is a concern that risk level has changed.  Suicide risk assessment  Patient has following modifiable risk factors for suicide: active suicidal ideation, under treated depression , recklessness, and lack of access to outpatient mental health resources, which we are addressing by continuing adequate safety precautions while she is  hospitalized and adjusting medications   Patient has following non-modifiable or demographic risk factors for suicide: early widowhood, history of suicide attempt, history of self harm behavior, and psychiatric hospitalization  Patient has the following protective factors against suicide: Supportive friends  Thank you for this consult request. Recommendations have been communicated to the primary team.  We will follow at this time.    Lance Muss, MD   Total Time Spent in Direct Patient Care:  I personally spent 25 minutes on the unit in direct patient care. The direct patient care time included face-to-face time with the patient, reviewing the patient's chart, communicating with other professionals, and coordinating care. Greater than 50% of this time was spent in counseling or coordinating care with the patient regarding goals of hospitalization, psycho-education, and discharge planning needs.    Psychiatric and Social History   Relevant Aspects of Hospital Course:  Admitted on 08/22/2022 for suicide attempt by overdose on her prescription medication.   Patient Report:  Patient seen this morning. She is alert and oriented. She feels tired and terrified, stating "Im terrified of someone". When I ask her who, she states "I dont want to tell you". She reports poor sleep and when asked about specific elements of her sleep, she is not able to elaborate. She reports poor appetite (however, patient's sitter states that the patient has been eating her meals appropriately). She denies SI and AVH. She reports HI towards her family who she states that they abandoned her. She denies adverse effects  from her medications.   Collateral 8/14: called James, no response 8/12: tried to call Fayrene Fearing with no response.   8/9 Called Debra with CST - stated she would be happy to conduct a termination session with Ardena if she reached out . She has not been called by [name] during this hospitalization.  8/9  Called Dr. Pershing Proud with [facility] to discuss pt's recent decompensation    [employer] [employer] in to note above   Past Psychiatric History: Previous Psych Diagnoses: Schizoaffective disorder bipolar type, Anxiety, Autism spectrum disorder, PTSD, Borderline personality disorder Prior inpatient treatment: multiple, 15 ED visits and 4 inpatient admissions since 06/2022 (~50 total in 2024), admitted to [facility] from 07/29/22-08/02/2022, has been to [facility] for eight months in 2016, and was discharged from inpatient facility past week Current/prior outpatient treatment: [facility], CST team twice a week Prior rehab hx: denies multiple Psychotherapy hx: [facility] History of suicide: 20-30 times by overdosing on pills, cutting or drinking 409 disinfectant History of homicide or aggression: denies Psychiatric medication history:  Previously trials of Tegretol, Haldol, Clozaril, Depakote, Invega, Lamictal, Seroquel, Asenapine, Abilify, vraylar (worsened suicidal thoughts), Lexapro, Zoloft  Neuromodulation history: denies  Social History:  Marital status: Widowed Children: 51 year old daughter Lives: with ex-boyfriend Fayrene Fearing, in two story home Employment: Disability, 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  Access to weapons: None   Exam Findings   Psychiatric Specialty Exam:  Presentation  General Appearance: Disheveled  Eye Contact:None  Speech:Clear and Coherent  Speech Volume:Normal  Handedness:Right   Mood and Affect  Mood:-- "Okay"  Affect:-- congruent, blunt   Thought Process  Thought Processes:Coherent  Descriptions of Associations:Intact  Orientation:Full (Time, Place and Person)  Thought Content:Logical  History of Schizophrenia/Schizoaffective disorder: Yes  Duration of Psychotic Symptoms:Greater than six months  Hallucinations: Denies  Ideas of Reference:Paranoia  Suicidal Thoughts:  Denies  Homicidal Thoughts: Reports HI towards parents   Sensorium  Memory:Immediate Good; Recent Good; Remote Good  Judgment:Poor  Insight: Lacking   Executive Functions  Concentration:Fair  Attention Span:Fair  Recall:Fair  Fund of Knowledge:Fair  Language:Good   Psychomotor Activity  Psychomotor Activity: Normal  Assets  Assets:Communication Skills   Sleep  Sleep: Fair    Physical Exam: Vital signs:  Temp:  [98.3 F (36.8 C)] 98.3 F (36.8 C) (08/19 1514) Pulse Rate:  [100] 100 (08/19 1514) Resp:  [18] 18 (08/19 1514) BP: (130)/(74) 130/74 (08/19 1514) SpO2:  [96 %] 96 % (08/20 0818) Physical Exam Vitals and nursing note reviewed. Exam conducted with a chaperone present.  Constitutional:      General: She is not in acute distress.    Appearance: She is obese. She is not ill-appearing.  HENT:     Head: Normocephalic.  Cardiovascular:     Rate and Rhythm: Normal rate.  Pulmonary:     Effort: Pulmonary effort is normal. No respiratory distress.  Neurological:     General: No focal deficit present.     Mental Status: She is alert.  Psychiatric:        Attention and Perception: Attention normal.        Speech: Speech normal.     Blood pressure 130/74, pulse 100, temperature 98.3 F (36.8 C), temperature source Oral, resp. rate 18, height 5\' 5"  (1.651 m), weight 98.2 kg, last menstrual period 08/08/2022, SpO2 96%. Body mass index is 36.03 kg/m.   Other History   These have been pulled in  through the EMR, reviewed, and updated if appropriate.   Family History:  The patient's family history includes Asthma in her father; Mental illness in her father; PDD in her brother; Seizures in her brother.  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 fragile X syndrome    Chronic constipation    Depression    Drug-seeking behavior    Essential tremor    Headache    Ineffective individual coping 05/16/2022   Insomnia 01/12/2022   Intentional drug overdose (HCC) 06/05/2022   Neuromuscular disorder (HCC)    Normocytic anemia 06/05/2022   Overdose 07/22/2017   Overdose of acetaminophen 07/2017   and other meds   Overdose, intentional self-harm, initial encounter (HCC) 07/20/2021   Paranoia (HCC) 04/22/2021   Personality disorder (HCC)    Purposeful non-suicidal drug ingestion (HCC) 06/27/2021   Schizo-affective psychosis (HCC)    Schizoaffective disorder (HCC) 07/29/2022   Schizoaffective disorder, bipolar type (HCC)    Seizures (HCC)    Last seizure December 2017   Skin erythema 04/27/2022   Sleep apnea    Suicidal behavior 07/25/2021   Suicidal ideation    Suicide (HCC) 07/01/2021   Suicide attempt (HCC) 07/04/2021    Surgical History: Past Surgical History:  Procedure Laterality Date   MOUTH SURGERY  2009 or 2010    Medications:   Current Facility-Administered Medications:    0.9 %  sodium chloride infusion, 250 mL, Intravenous, Continuous, Gonfa, Taye T, MD, Stopped at 08/24/22 1347   albuterol (PROVENTIL) (2.5 MG/3ML) 0.083% nebulizer solution 3 mL, 3 mL, Inhalation, Q6H PRN, Alanda Slim, Taye T, MD   cyclobenzaprine (FLEXERIL) tablet 10 mg, 10 mg, Oral, QHS, Gonfa, Taye T, MD, 10 mg at 09/24/22 2042   diclofenac Sodium (VOLTAREN) 1 % topical gel 2 g, 2 g, Topical, QID, Gonfa, Taye T, MD, 2 g at 09/24/22 1817   DULoxetine (CYMBALTA) DR capsule 60 mg, 60 mg, Oral, Daily, Akintayo, Mojeed, MD, 60 mg at 09/25/22 0827   ferrous sulfate tablet 325 mg, 325 mg, Oral, BID WC, Gonfa, Taye T, MD, 325 mg at 09/25/22 0827   gabapentin (NEURONTIN) capsule 600 mg, 600 mg, Oral, TID, Alanda Slim, Taye T, MD, 600 mg at 09/25/22  0827   hydrOXYzine (ATARAX) tablet 25 mg, 25 mg, Oral, TID PRN, Alanda Slim, Taye T, MD, 25 mg at 09/25/22 0827   ibuprofen (ADVIL) tablet 600 mg, 600 mg, Oral, TID PRN, Alanda Slim, Taye T, MD, 600 mg at 09/24/22 1336   LORazepam (ATIVAN) tablet 0.5 mg, 0.5 mg, Oral, BID PRN, Alanda Slim, Taye T, MD, 0.5 mg at 09/24/22 2328   mometasone-formoterol (DULERA) 200-5 MCG/ACT inhaler 2 puff, 2 puff, Inhalation, BID, Alanda Slim, Taye T, MD, 2 puff at 09/25/22 0818   mupirocin cream (BACTROBAN) 2 %, , Topical, Daily, Marguerita Merles [place], DO, Given at 09/21/22 0903   OLANZapine (ZYPREXA) injection 10 mg, 10 mg, Intramuscular, Once PRN, Akintayo, Mojeed, MD   OLANZapine (ZYPREXA) tablet 15 mg, 15 mg, Oral, Daily, Cinderella, Margaret A, 15 mg at 09/24/22 2043   OLANZapine zydis (ZYPREXA) disintegrating tablet 5 mg, 5 mg, Oral, Q1200, Mariel Craft, MD, 5 mg at 09/24/22 1332  ondansetron (ZOFRAN-ODT) disintegrating tablet 4 mg, 4 mg, Oral, Q8H PRN, Sheikh, Omair Latif, DO, 4 mg at 09/17/22 1700   Oral care mouth rinse, 15 mL, Mouth Rinse, PRN, Osvaldo Shipper, MD   pantoprazole (PROTONIX) EC tablet 40 mg, 40 mg, Oral, QAC breakfast, Gonfa, Taye T, MD, 40 mg at 09/25/22 0827   polyethylene glycol (MIRALAX / GLYCOLAX) packet 17 g, 17 g, Oral, BID PRN, Alanda Slim, Taye T, MD, 17 g at 09/24/22 1336   prazosin (MINIPRESS) capsule 1 mg, 1 mg, Oral, QHS, Gonfa, Taye T, MD, 1 mg at 09/24/22 2042   senna-docusate (Senokot-S) tablet 1 tablet, 1 tablet, Oral, BID PRN, Candelaria Stagers T, MD, 1 tablet at 09/24/22 1336   sodium bicarbonate tablet 650 mg, 650 mg, Oral, TID, Candelaria Stagers T, MD, 650 mg at 09/25/22 0827  Allergies: Allergies  Allergen Reactions   Bee Venom Anaphylaxis   Coconut Flavor Anaphylaxis and Rash   Fish Allergy Anaphylaxis   Geodon [Ziprasidone Hcl] Other (See Comments)    Pt states that this medication causes paralysis of the mouth.     Haloperidol And Related Other (See Comments)    Pt states that this medication  causes paralysis of the mouth, jaw locks up   Lithobid [Lithium] Other (See Comments)    Seizure-like activity    Roxicodone [Oxycodone] Other (See Comments)    Hallucinations    Seroquel [Quetiapine] Other (See Comments)    Severe drowsiness - Pt currently taking 12.5 mg BID, she is ok with this dose   Shellfish Allergy Anaphylaxis   Phenergan [Promethazine Hcl] Other (See Comments)    Chest pain     Prilosec [Omeprazole] Nausea And Vomiting and Other (See Comments)    Pt can take protonix with no problems    Sulfa Antibiotics Other (See Comments)    Chest pain    Tegretol [Carbamazepine] Nausea And Vomiting   Prozac [Fluoxetine] Other (See Comments)    Increased Depression and Suicidal thoughts   Tape Other (See Comments)    Skin tears, can only tolerate paper tape.   Tylenol [Acetaminophen] Rash and Other (See Comments)    Rash on face

## 2022-09-25 NOTE — Progress Notes (Signed)
PROGRESS NOTE    [name]  VHQ:[id number] DOB: Jun 02, 1990 DOA: 08/22/2022 [facility]CP: [facility]    Brief Narrative:  This 32 yrs old female with PMH significant of adjustment disorder with mixed anxiety and depressed mood, autism, bipolar 1 disorder, fragile x syndrome, seizures not on meds, schizoaffective disorder and multiple suicide attempts in the last year presented to the ED after ingesting numerous medications in a suicide attempt. She reported taking #30 metoprolol 25mg  tablets, #60 Hydroxyzine 25mg , #7 Lexapro 20mg , #21 Gabapentin 600mg , #7 Flexeril 10mg , #7 protonix 40mg  and #7 Prazosin 1mg . She was slightly bradycardic and hypotensive on EMS arrival which improved with fluids. Poison control was contacted and as she was within 2hrs of ingestion was given activated charcoal. Patient was placed under IVC in ED. Poison control also recommended ICU admission for close Qtc and electrolyte monitoring and possible pressors. She was intubated and started on vasopressor and glucagon. Also started on ceftriaxone for possible pneumonia. Patient was extubated on 7/18 and transferred to the floor on 7/19. She was transferred to [facility] service on 08/25/22. Psychiatry continues to follow. They recommend inpatient psychiatric hospitalization. Awaiting placement   Assessment & Plan:   Principal Problem:   Bipolar I disorder, current or most recent episode depressed, with psychotic features (HCC) Active Problems:   Asthma   GAD (generalized anxiety disorder)   Suicide attempt (HCC)   Overdose   Drug overdose  Suicidal Attempt with intentional overdose:  She has taken multiple medications including # 30 metoprolol 25mg  tablets, # 60 Hydroxyzine 25mg , # 7 Lexapro 20mg , # 21 Gabapentin 600mg , # 7 Flexeril 10mg , # 7 protonix 40mg  and # 7 Prazosin 1mg .  Poison control was contacted.   Received charcoal in the ED.  Initially required vasopressor,  glucagon,  intubation and mechanical  ventilation.  Currently on IVC. Psychiatry placed APS referral as well.  Further care as per psychiatry.       Anxiety and Depression, Schizoaffective Disorder, Adjustment Disorder, bipolar disorder, fibromyalgia and auditory hallucination:  Meds per psych. Continue one-to-one sitter at bedside.     Community-acquired pneumonia: Completed antibiotic treatment for community-acquired pneumonia.   HTN: Continue Lopressor.   Sinus Tachycardia: Continue Lopressor.  Improved.   Metabolic Acidosis: Improved.  Recent labs.  Check BMP.   Hypomagnesemia: Improved.   Microcytic Anemia: Iron deficiency anemia.  Low ferritin.  Continue oral iron.   History of Seizure Disorder: -Stable.  Not on meds.   History of Asthma: Continue nebulizers.  Stable.   Dysuria: She was  catheterized during ICU.   - recently treated with antibiotics.   Hypoalbuminemia: Encourage oral intake.   Obesity: Body mass index is 36.03 kg/m.  Would benefit from weight loss as outpatient.   DVT prophylaxis: SCDs Code Status: Full code Family Communication:No family at bed side. Disposition Plan:   Status is: Inpatient Remains inpatient appropriate because: Admitted for suicidal ideations,  evaluated by psychiatry. Patient is awaiting psych inpatient hospitalization.   Consultants:  Psychiatry  Procedures: None.  Antimicrobials:  Anti-infectives (From admission, onward)    Start     Dose/Rate Route Frequency Ordered Stop   08/26/22 1415  amoxicillin-clavulanate (AUGMENTIN) 875-125 MG per tablet 1 tablet        1 tablet Oral Every 12 hours 08/26/22 1316 08/27/22 2205   08/23/22 1200  cefTRIAXone (ROCEPHIN) 2 g in sodium chloride 0.9 % 100 mL IVPB  Status:  Discontinued        2 g 200 mL/hr over  30 Minutes Intravenous Every 24 hours 08/23/22 0931 08/26/22 1316      Subjective: Patient was seen and examined at bedside.  Overnight events noted. Patient reports not feeling well.  Reports  having good and bad days. She has intermittent episodes where she has feelings about hurting herself  Objective: Vitals:   09/23/22 1843 09/24/22 0804 09/24/22 1514 09/25/22 0818  BP: 130/63  130/74   Pulse: (!) 118 (!) 102 100   Resp: 16 16 18    Temp: 98.3 F (36.8 C)  98.3 F (36.8 C)   TempSrc: Oral  Oral   SpO2: 99% 98% 96% 96%  Weight:      Height:        Intake/Output Summary (Last 24 hours) at 09/25/2022 1440 Last data filed at 09/25/2022 1243 Gross per 24 hour  Intake 720 ml  Output 400 ml  Net 320 ml   Filed Weights   09/01/22 0300 09/02/22 0500 09/03/22 0402  Weight: 98.8 kg 99 kg 98.2 kg    Examination:  General exam: Appears calm and comfortable, not in any acute distress. Respiratory system: Clear to auscultation. Respiratory effort normal.  RR 14 Cardiovascular system: S1 & S2 heard, RRR. No JVD, murmurs, rubs, gallops or clicks. No pedal edema. Gastrointestinal system: Abdomen is nondistended, soft and nontender. Normal bowel sounds heard. Central nervous system: Alert and oriented x 3. No focal neurological deficits. Extremities: No edema, no cyanosis, no clubbing. Skin: No rashes, lesions or ulcers Psychiatry: Judgement and insight appear normal. Mood & affect appropriate.     Data Reviewed: I have personally reviewed following labs and imaging studies  CBC: Recent Labs  Lab 09/19/22 1844 09/25/22 0748  WBC 8.3 8.4  HGB 11.0* 11.8*  HCT 35.0* 38.2  MCV 81.0 80.1  PLT 255 263   Basic Metabolic Panel: Recent Labs  Lab 09/19/22 1844 09/25/22 0748  NA 136 137  K 3.8 3.6  CL 104 103  CO2 23 22  GLUCOSE 125* 94  BUN 10 11  CREATININE 0.73 0.67  CALCIUM 8.5* 8.9  MG 1.7 1.7  PHOS  --  4.2   GFR: Estimated Creatinine Clearance: 116.1 mL/min (by C-G formula based on SCr of 0.67 mg/dL). Liver Function Tests: Recent Labs  Lab 09/25/22 0748  AST 16  ALT 19  ALKPHOS 68  BILITOT 0.3  PROT 6.9  ALBUMIN 3.6   No results for input(s):  "LIPASE", "AMYLASE" in the last 168 hours. No results for input(s): "AMMONIA" in the last 168 hours. Coagulation Profile: No results for input(s): "INR", "PROTIME" in the last 168 hours. Cardiac Enzymes: No results for input(s): "CKTOTAL", "CKMB", "CKMBINDEX", "TROPONINI" in the last 168 hours. BNP (last 3 results) No results for input(s): "PROBNP" in the last 8760 hours. HbA1C: No results for input(s): "HGBA1C" in the last 72 hours. CBG: No results for input(s): "GLUCAP" in the last 168 hours. Lipid Profile: No results for input(s): "CHOL", "HDL", "LDLCALC", "TRIG", "CHOLHDL", "LDLDIRECT" in the last 72 hours. Thyroid Function Tests: No results for input(s): "TSH", "T4TOTAL", "FREET4", "T3FREE", "THYROIDAB" in the last 72 hours. Anemia Panel: No results for input(s): "VITAMINB12", "FOLATE", "FERRITIN", "TIBC", "IRON", "RETICCTPCT" in the last 72 hours. Sepsis Labs: No results for input(s): "PROCALCITON", "LATICACIDVEN" in the last 168 hours.  No results found for this or any previous visit (from the past 240 hour(s)).   Radiology Studies: No results found.  Scheduled Meds:  cyclobenzaprine  10 mg Oral QHS   diclofenac Sodium  2 g Topical  QID   DULoxetine  60 mg Oral Daily   ferrous sulfate  325 mg Oral BID WC   gabapentin  600 mg Oral TID   mometasone-formoterol  2 puff Inhalation BID   mupirocin cream   Topical Daily   OLANZapine  15 mg Oral Daily   OLANZapine zydis  5 mg Oral Q1200   pantoprazole  40 mg Oral QAC breakfast   prazosin  1 mg Oral QHS   sodium bicarbonate  650 mg Oral TID   Continuous Infusions:  sodium chloride Stopped (08/24/22 1347)     LOS: 34 days    Time spent: 35 mins    Willeen Niece, MD [facility]   If 7PM-7AM, please contact night-coverage

## 2022-09-25 NOTE — Progress Notes (Signed)
Pt is refusing vitals. Pt stated "we are plotting to harm her". Hospitalist coverage notified.

## 2022-09-25 NOTE — Progress Notes (Signed)
   09/25/22 1424  Spiritual Encounters  Type of Visit Initial  Care provided to: Patient  Conversation partners present during encounter Other (comment) (sitter was in the room)  Referral source Patient request  Reason for visit Routine spiritual support  OnCall Visit No  Spiritual Framework  Presenting Themes Meaning/purpose/sources of inspiration;Goals in life/care;Significant life change;Coping tools  Patient Stress Factors Loss of control;Exhausted;Family relationships  Interventions  Spiritual Care Interventions Made Compassionate presence;Reflective listening;Explored values/beliefs/practices/strengths  Intervention Outcomes  Outcomes Awareness around self/spiritual resourses;Awareness of support;Reduced isolation   Chaplain responded to spiritual consult to visit and talk with Pt.  This chaplain has a prior relationship with Pt having visited with her several times in the past weeks.  Pt continues to experience frustration surrounding relationship with parents and is currently convinced that they are trying to harm her.  Pt speaks frequently about having lost everything and how others have taken all that she has.  This seems to be an area of intense emotions for her.  Pt expresses frustration that writing tools have been taken from her; she seems to understand that she bares responsibility for that, but writing in her journal is an important form of coping for her.  Pt engaged chaplain in light conversation about her likes and things she enjoys watching on TV and seem noticeably less agitated after our 30 minutes conversation.     Pt was educated on how to reach out to Spiritual Care when she needs to talk.  Chaplain services remain available by Spiritual Consult or for emergent cases, paging [phone]  Chaplain Raelene Bott, MDiv Cariana [email or web address] [phone]

## 2022-09-26 DIAGNOSIS — F315 Bipolar disorder, current episode depressed, severe, with psychotic features: Secondary | ICD-10-CM | POA: Diagnosis not present

## 2022-09-26 LAB — TROPONIN I (HIGH SENSITIVITY): Troponin I (High Sensitivity): 4 ng/L (ref <18)

## 2022-09-26 LAB — D-DIMER, QUANTITATIVE: D-Dimer, Quant: <0.27 ug{FEU}/mL (ref 0.00–0.50)

## 2022-09-26 MED ORDER — CHOLECALCIFEROL 10 MCG (400 UNIT) PO TABS
400.0000 [IU] | ORAL_TABLET | Freq: Every day | ORAL | Status: DC
  Administered 2022-09-26 – 2022-10-19 (×24): 400 [IU] via ORAL
  Filled 2022-09-26 (×25): qty 1

## 2022-09-26 MED ORDER — METOPROLOL TARTRATE 12.5 MG HALF TABLET
12.5000 mg | ORAL_TABLET | Freq: Two times a day (BID) | ORAL | Status: DC
  Administered 2022-09-26 – 2022-10-19 (×47): 12.5 mg via ORAL
  Filled 2022-09-26 (×47): qty 1

## 2022-09-26 NOTE — Progress Notes (Signed)
PROGRESS NOTE    [name]  NAT:[id number] DOB: October 01, 1990 DOA: 08/22/2022 PCP: [facility]    Brief Narrative:  This 32 yrs old female with PMH significant of adjustment disorder with mixed anxiety and depressed mood, autism, bipolar 1 disorder, fragile x syndrome, seizures not on meds, schizoaffective disorder and multiple suicide attempts in the last year presented to the ED after ingesting numerous medications in a suicide attempt. She reported taking #30 metoprolol 25mg  tablets, #60 Hydroxyzine 25mg , #7 Lexapro 20mg , #21 Gabapentin 600mg , #7 Flexeril 10mg , #7 protonix 40mg  and #7 Prazosin 1mg . She was slightly bradycardic and hypotensive on EMS arrival which improved with fluids. Poison control was contacted and as she was within 2hrs of ingestion was given activated charcoal. Patient was placed under IVC in ED. Poison control also recommended ICU admission for close Qtc and electrolyte monitoring and possible pressors. She was intubated and started on vasopressor and glucagon. Also started on ceftriaxone for possible pneumonia. Patient was extubated on 7/18 and transferred to the floor on 7/19. She was transferred to [facility] service on 08/25/22. Psychiatry continues to follow. They recommend inpatient psychiatric hospitalization. Awaiting placement   Assessment & Plan:   Principal Problem:   Bipolar I disorder, current or most recent episode depressed, with psychotic features (HCC) Active Problems:   Asthma   GAD (generalized anxiety disorder)   Suicide attempt (HCC)   Overdose   Drug overdose    Suicidal Attempt with intentional overdose:  She has taken multiple medications including # 30 metoprolol 25mg  tablets, # 60 Hydroxyzine 25mg , # 7 Lexapro 20mg , # 21 Gabapentin 600mg , # 7 Flexeril 10mg , # 7 protonix 40mg  and # 7 Prazosin 1mg .  Poison control was contacted.   Received charcoal in the ED.  Initially required vasopressor,  glucagon,  intubation and mechanical  ventilation.  Currently on IVC. Psychiatry placed APS referral as well.  Further care as per psychiatry.     Anxiety and Depression, Schizoaffective Disorder, Adjustment Disorder, bipolar disorder, fibromyalgia and auditory hallucination:  Meds per psych. Continue one-to-one sitter at bedside as she continues to harm herself   Community-acquired pneumonia: Completed antibiotic treatment for community-acquired pneumonia.   HTN: Continue Lopressor.   Sinus Tachycardia: Continue Lopressor.   -check d dimer and orthostatics -check troponin   Metabolic Acidosis: -resolved   Hypomagnesemia: Improved.   Microcytic Anemia: Iron deficiency anemia.  Low ferritin.  Continue oral iron.   History of Seizure Disorder: -Stable.  Not on meds.   History of Asthma: Continue nebulizers.  Stable.   Dysuria: She was  catheterized during ICU.   - recently treated with antibiotics.   Hypoalbuminemia: Encourage oral intake.   Obesity: Body mass index is 36.03 kg/m.  Would benefit from weight loss as outpatient.   DVT prophylaxis: SCDs Code Status: Full code Family Communication:No family at bed side. Disposition Plan:   Status is: Inpatient Remains inpatient appropriate because: Admitted for suicidal ideations,  evaluated by psychiatry. Patient is awaiting psych inpatient hospitalization.   Consultants:  Psychiatry    Subjective: Asking for her lopressor to be restarted  Objective: Vitals:   09/25/22 2102 09/25/22 2150 09/25/22 2356 09/26/22 0551  BP:  127/81  115/85  Pulse:  (!) 122 (!) 107 (!) 115  Resp:  20  18  Temp:  97.7 F (36.5 C)  98.2 F (36.8 C)  TempSrc:  Oral  Oral  SpO2: 99% 100% 98% 98%  Weight:      Height:  Intake/Output Summary (Last 24 hours) at 09/26/2022 1218 Last data filed at 09/26/2022 1121 Gross per 24 hour  Intake 960 ml  Output --  Net 960 ml   Filed Weights   09/01/22 0300 09/02/22 0500 09/03/22 0402  Weight: 98.8 kg 99 kg  98.2 kg    Examination:  In bed, NAD    Data Reviewed: I have personally reviewed following labs and imaging studies  CBC: Recent Labs  Lab 09/19/22 1844 09/25/22 0748  WBC 8.3 8.4  HGB 11.0* 11.8*  HCT 35.0* 38.2  MCV 81.0 80.1  PLT 255 263   Basic Metabolic Panel: Recent Labs  Lab 09/19/22 1844 09/25/22 0748  NA 136 137  K 3.8 3.6  CL 104 103  CO2 23 22  GLUCOSE 125* 94  BUN 10 11  CREATININE 0.73 0.67  CALCIUM 8.5* 8.9  MG 1.7 1.7  PHOS  --  4.2   GFR: Estimated Creatinine Clearance: 116.1 mL/min (by C-G formula based on SCr of 0.67 mg/dL). Liver Function Tests: Recent Labs  Lab 09/25/22 0748  AST 16  ALT 19  ALKPHOS 68  BILITOT 0.3  PROT 6.9  ALBUMIN 3.6   No results for input(s): "LIPASE", "AMYLASE" in the last 168 hours. No results for input(s): "AMMONIA" in the last 168 hours. Coagulation Profile: No results for input(s): "INR", "PROTIME" in the last 168 hours. Cardiac Enzymes: No results for input(s): "CKTOTAL", "CKMB", "CKMBINDEX", "TROPONINI" in the last 168 hours. BNP (last 3 results) No results for input(s): "PROBNP" in the last 8760 hours. HbA1C: No results for input(s): "HGBA1C" in the last 72 hours. CBG: No results for input(s): "GLUCAP" in the last 168 hours. Lipid Profile: No results for input(s): "CHOL", "HDL", "LDLCALC", "TRIG", "CHOLHDL", "LDLDIRECT" in the last 72 hours. Thyroid Function Tests: No results for input(s): "TSH", "T4TOTAL", "FREET4", "T3FREE", "THYROIDAB" in the last 72 hours. Anemia Panel: No results for input(s): "VITAMINB12", "FOLATE", "FERRITIN", "TIBC", "IRON", "RETICCTPCT" in the last 72 hours. Sepsis Labs: No results for input(s): "PROCALCITON", "LATICACIDVEN" in the last 168 hours.  No results found for this or any previous visit (from the past 240 hour(s)).   Radiology Studies: No results found.  Scheduled Meds:  cyclobenzaprine  10 mg Oral QHS   diclofenac Sodium  2 g Topical QID   DULoxetine   60 mg Oral Daily   ferrous sulfate  325 mg Oral BID WC   gabapentin  600 mg Oral TID   metoprolol tartrate  12.5 mg Oral BID   mometasone-formoterol  2 puff Inhalation BID   mupirocin cream   Topical Daily   OLANZapine  15 mg Oral Daily   OLANZapine zydis  5 mg Oral Q1200   pantoprazole  40 mg Oral QAC breakfast   prazosin  1 mg Oral QHS   sodium bicarbonate  650 mg Oral TID   Continuous Infusions:  sodium chloride Stopped (08/24/22 1347)     LOS: 35 days    Time spent: 35 mins    Joseph Art, DO [facility]   If 7PM-7AM, please contact night-coverage

## 2022-09-26 NOTE — Care Management Important Message (Signed)
Important Message  Patient Details  Name: [name] MRN: [id number] Date of Birth: Dec 29, 1990   Medicare Important Message Given:  Yes     Sherilyn Banker 09/26/2022, 1:42 PM

## 2022-09-26 NOTE — TOC Progression Note (Addendum)
Transition of Care [facility]) - Progression Note    Patient Details  Name: [name] MRN: [id number] Date of Birth: 09/04/1990  [facility] of [facility]) CM/SW Contact  Lorri Frederick, LCSW Phone Number: 09/26/2022, 2:59 PM  Clinical Narrative:  IVC paperwork renewed.  [employer] called for service.   [facility] CSW: Dalene Carrow [phone]  CSW LM requesting fax to forward letter from psychiatrist, Dr Gasper Sells.      Expected Discharge Plan and Services                                               Social Determinants of Health (SDOH) Interventions SDOH Screenings   Food Insecurity: No Food Insecurity (07/29/2022)  Housing: Low Risk  (07/29/2022)  Transportation Needs: No Transportation Needs (07/29/2022)  Recent Concern: Transportation Needs - Unmet Transportation Needs (05/06/2022)  Utilities: Not At Risk (07/29/2022)  Alcohol Screen: Low Risk  (07/29/2022)  Depression (PHQ2-9): Medium Risk (03/10/2022)  Tobacco Use: Medium Risk (09/07/2022)    Readmission Risk Interventions    06/06/2022    3:38 PM  Readmission Risk Prevention Plan  Transportation Screening Complete  Medication Review (RN Care Manager) Complete  PCP or Specialist appointment within 3-5 days of discharge Complete  HRI or Home Care Consult Complete  SW Recovery Care/Counseling Consult Complete  Palliative Care Screening Not Applicable  Skilled Nursing Facility Not Applicable

## 2022-09-26 NOTE — Progress Notes (Signed)
RN called to room by safety sitter d/t patient banging her head against the wall repeatedly. RN intervenes, patient agreeable to get back in bed. Once back in bed patient began to bang her head on bedside table. Table removed and patient redirected. Prompted to speak with RN about what is bothering her, patient repeatedly says, "I don't want to talk about it. It's my stupid family" Admits to being depressed and states she, "just doesn't care anymore." RN reassured patient, reiterating importance of safety.

## 2022-09-26 NOTE — Consult Note (Signed)
[name] Consult Evaluation  Service Date: September 26, 2022 LOS:  LOS: 35 days    Primary Psychiatric Diagnoses   Schizoaffective disorder, bipolar type  GAD  Borderline personality disorder  Assessment  [name] is a 32 y.o. female with a past psychiatric history of borderline personality disorder, schizoaffective disorder bipolar type, and PTSD.  She has had numerous behavioral health admissions and an extensive history of suicide attempts.  Patient presents to [facility] ED for an attempted overdose on her prescription medications.  Briefly, this pt has had ~50 ED visits or psych hospitalizations during 2024; this continues a pattern of an inability to maintain safety in the outpatient setting going back multiple years. The vast majority of her suicide attempts have been via overdose on psychotropic medication. She has failed multiple levels of outpatient management up to and including CST; she has been declined by multiple psychiatric facilities due to futility, and she has been discharged by her CST team as of 8/8. She is currently on the [facility] priority wait list. She is not eligible for ACT services through [facility] due to BPD diagnosis.   We are trying to simplify patient's medication regimen to avoid polypharmacy especially in the setting of her recent overdose. Earlier in hospitalization during a period of improvement (roughly 8/1 to 8/8), we discussed medication management if and when she does return home including closer monitoring, less access to her medications through a locked cabinet, and her ex-boyfriend being more in charge of her medications in which was confirmed with her ex-boyfriend before being dropped by her CST.  After notification of discontinuation from CST services and ineligibility for ACT services, patient had acute worsening of reported hallucinations, suicidality, homicidality and agitation (fear of abandonment is central feature in BPD) and  consideration of community f/u was terminated. Patient has an extensive history of ER visits and hospitalizations in which she has displayed aggression, psychosis, and danger to self even while in proximity of a Recruitment consultant. Patient also shows high level of lethality, intensity, and frequency of suicidal attempts with multiple inpatient psychiatric admissions with little benefit so we continue to recommend [facility]; given wait list will also refer to other psychiatric hospitals. Patient is currently on the [facility] priority wait list and we are working closely with LSW with bed availability.   8/21: Patient is tearful this morning, stating her friend Fayrene Fearing no longer wants to speak with her nor live with her in the future. Patient has conflicting stories regarding how she found out about the news, one time stating she learned about the news this morning and then states she found out when Lane visited on Sunday. With this news, she is stating she has passive SI with thought of self harm. Behavioral plan has been created with patient's collaboration and agreement today. Privileges including walks, visitations, chaplain visits, and access to a writing utensil will be used as positive reinforcement wools by granting access if she remains off restraints and if there are no notifications from the nurses with statements or behaviors of self harm or harm to staff.  Diagnoses:  Active Hospital problems: Principal Problem:   Bipolar I disorder, current or most recent episode depressed, with psychotic features (HCC) Active Problems:   Asthma   GAD (generalized anxiety disorder)   Suicide attempt (HCC)   Overdose   Drug overdose    Plan   ## Psychiatric Medication Recommendations:  -- Continue Zyprexa 15 mg po nightly for psychosis  -- Continue Zyprexa  5 mg PO daily at noon for mood stabilization -- Continue Cymbalta 60 mg daily -- Continue Gabapentin 600 mg TID for agitation, mood/anxiety -- continue prazosin 1  mg at bedtime for PTSD  Agitation Protocol -- PRN Zyprexa 10 mg IM for agitation -- PRN Ativan 0.5 mg oral BID PRN for agitation -- PRN Atarax 25 mg TID for anxiety, agitation  ## Medical Decision Making Capacity:   Capacity was not formally addressed during this encounter  ## Further Work-up:  -- Per primary -- most recent EKG on 08/30/2022 had QtC of 469  ## Disposition:  -- We recommend inpatient psychiatric hospitalization after medical hospitalization. Patient has been involuntarily committed on 09/04/2022.  -- IVC renewed on 8/21 -- Patient remains on [facility] priority wait list  ## Behavioral / Environmental:  --  To minimize splitting of staff, assign one staff person to communicate all information from the team when feasible. -- Apply the established behavioral rules and protocols consistently throughout hospitalization (see nursing care order for details). Ensure that all staff members are aware of and adhere to the same guidelines to avoid confusion and mixed messages.  ## Safety and Observation Level:  - Based on my clinical evaluation, I estimate the patient to be at elevated risk of self harm in the current setting - At this time, we recommend continuing a 1:1 level of observation. This decision is based on my review of the chart including patient's history and current presentation, interview of the patient, mental status examination, and consideration of suicide risk including evaluating suicidal ideation, plan, intent, suicidal or self-harm behaviors, risk factors, and protective factors. This judgment is based on our ability to directly address suicide risk, implement suicide prevention strategies and develop a safety plan while the patient is in the clinical setting. Please contact our team if there is a concern that risk level has changed.  Suicide risk assessment  Patient has following modifiable risk factors for suicide: active suicidal ideation, under treated depression ,  recklessness, and lack of access to outpatient mental health resources, which we are addressing by continuing adequate safety precautions while she is hospitalized and adjusting medications   Patient has following non-modifiable or demographic risk factors for suicide: early widowhood, history of suicide attempt, history of self harm behavior, and psychiatric hospitalization  Patient has the following protective factors against suicide: Supportive friends  Thank you for this consult request. Recommendations have been communicated to the primary team.  We will follow at this time.    Lance Muss, MD   Total Time Spent in Direct Patient Care:  I personally spent 30 minutes on the unit in direct patient care. The direct patient care time included face-to-face time with the patient, reviewing the patient's chart, communicating with other professionals, and coordinating care. Greater than 50% of this time was spent in counseling or coordinating care with the patient regarding goals of hospitalization, psycho-education, and discharge planning needs.    Psychiatric and Social History   Relevant Aspects of Hospital Course:  Admitted on 08/22/2022 for suicide attempt by overdose on her prescription medication.   Patient Report:  Patient was seen this AM. Patient was tearful this morning, stating she found out this morning that her friend of 5 years is gone. When I asked how she knew this, she stated "I do not want to talk about how I found out". She states that due to this news, she is having passive SI with thoughts to hurt herself. She states "[employer]  lost everything because my parents are taking everything away". Later in the evaluation, she reports that Fayrene Fearing visited her on Sunday (of note, there have not been notes of patient receiving visitation) and informed her that her parents spoke to him and instructed him to not speak with her anymore. She notes the chaplain visited her yesterday in which she  found the conversation helpful. She reports good sleep overnight and fair appetite. She denies issues or concerns regarding her medication regimen. She denies HI and AVH. I discussed how the patient continues to be on the priority list in [facility]. I also collaborated with the patient on a behavioral contract to assist with positive reinforcement of good behavior. I also discussed how we will continue the IVC at this time.  Collateral 8/14: called James, no response 8/12: tried to call Fayrene Fearing with no response.  8/9: called Debra with CST - stated she would be happy to conduct a termination session with Cornetta if she reached out . She has not been called by [name] during this hospitalization.  8/9: called Dr. Pershing Proud with [facility] to discuss pt's recent decompensation  8/5: spoke with Fayrene Fearing  [employer] in to note above   Past Psychiatric History: Previous Psych Diagnoses: Schizoaffective disorder bipolar type, Anxiety, Autism spectrum disorder, PTSD, Borderline personality disorder Prior inpatient treatment: multiple, 15 ED visits and 4 inpatient admissions since 06/2022 (~50 total in 2024), admitted to [facility] from 07/29/22-08/02/2022, has been to [facility] for eight months in 2016, and was discharged from inpatient facility past week Current/prior outpatient treatment: [facility], CST team twice a week Prior rehab hx: denies multiple Psychotherapy hx: [facility] History of suicide: 20-30 times by overdosing on pills, cutting or drinking 409 disinfectant History of homicide or aggression: denies Psychiatric medication history:  Previously trials of Tegretol, Haldol, Clozaril, Depakote, Invega, Lamictal, Seroquel, Asenapine, Abilify, vraylar (worsened suicidal thoughts), Lexapro, Zoloft  Neuromodulation history: denies  Social History:  Marital status: Widowed Children: 62 year old daughter Lives: with ex-boyfriend Fayrene Fearing, in two story home Employment: Disability, wants to have a  career in coding and billing Tobacco: quit smoking; smoked for two years.  Access to weapons: None   Exam Findings   Psychiatric Specialty Exam:  Presentation  General Appearance: Disheveled  Eye Contact:None  Speech:Clear and Coherent  Speech Volume:Normal  Handedness:Right   Mood and Affect  Mood:-- "Terrible"  Affect:-- Congruent   Thought Process  Thought Processes:Coherent  Descriptions of Associations:Intact  Orientation:Full (Time, Place and Person)  Thought Content:Logical  History of Schizophrenia/Schizoaffective disorder: Yes  Duration of Psychotic Symptoms:Greater than six months  Hallucinations: Denies  Ideas of Reference:Paranoia  Suicidal Thoughts: Reports passive SI and thoughts of self harm  Homicidal Thoughts: Reports HI towards parents   Sensorium  Memory:Immediate Good; Recent Good; Remote Good  Judgment:Poor  Insight: Lacking   Executive Functions  Concentration:Fair  Attention Span:Fair  Recall:Fair  Fund of Knowledge:Fair  Language:Good   Psychomotor Activity  Psychomotor Activity: Normal  Assets  Assets:Communication Skills   Sleep  Sleep: Fair    Physical Exam: Vital signs:  Temp:  [97.7 F (36.5 C)-98.5 F (36.9 C)] 98.2 F (36.8 C) (08/21 0551) Pulse Rate:  [107-122] 115 (08/21 0551) Resp:  [18-20] 18 (08/21 0551) BP: (115-136)/(79-85) 115/85 (08/21 0551) SpO2:  [98 %-100 %] 98 % (08/21 0551) Physical Exam Vitals and nursing note reviewed. Exam conducted with a chaperone present.  Constitutional:      General: She is not in acute distress.  Appearance: She is obese. She is not ill-appearing.  HENT:     Head: Normocephalic.  Cardiovascular:     Rate and Rhythm: Normal rate.  Pulmonary:     Effort: Pulmonary effort is normal. No respiratory distress.  Neurological:     General: No focal deficit present.     Mental Status: She is alert.  Psychiatric:        Attention and Perception:  Attention normal.        Speech: Speech normal.     Blood pressure 115/85, pulse (!) 115, temperature 98.2 F (36.8 C), temperature source Oral, resp. rate 18, height 5\' 5"  (1.651 m), weight 98.2 kg, last menstrual period 08/08/2022, SpO2 98%. Body mass index is 36.03 kg/m.   Other History   These have been pulled in through the EMR, reviewed, and updated if appropriate.   Family History:  The patient's family history includes Asthma in her father; Mental illness in her father; PDD in her brother; Seizures in her brother.  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    Chronic constipation    Depression    Drug-seeking behavior    Essential tremor    Headache    Ineffective individual coping 05/16/2022   Insomnia 01/12/2022   Intentional drug overdose (HCC) 06/05/2022   Neuromuscular disorder (HCC)    Normocytic anemia 06/05/2022   Overdose 07/22/2017   Overdose of acetaminophen 07/2017   and other meds   Overdose, intentional self-harm, initial encounter (HCC) 07/20/2021   Paranoia (HCC) 04/22/2021   Personality disorder (HCC)    Purposeful non-suicidal drug ingestion (HCC) 06/27/2021   Schizo-affective psychosis (HCC)    Schizoaffective disorder (HCC) 07/29/2022   Schizoaffective disorder, bipolar type (HCC)    Seizures (HCC)    Last seizure December 2017   Skin erythema 04/27/2022   Sleep apnea    Suicidal behavior 07/25/2021   Suicidal ideation    Suicide (HCC) 07/01/2021   Suicide attempt (HCC) 07/04/2021    Surgical History: Past Surgical History:  Procedure Laterality Date   MOUTH SURGERY  2009 or 2010    Medications:    Current Facility-Administered Medications:    0.9 %  sodium chloride infusion, 250 mL, Intravenous, Continuous, Gonfa, Taye T, MD, Stopped at 08/24/22 1347   albuterol (PROVENTIL) (2.5 MG/3ML) 0.083% nebulizer solution 3 mL, 3 mL, Inhalation, Q6H PRN, Alanda Slim, Taye T, MD   cyclobenzaprine (FLEXERIL) tablet 10 mg, 10 mg, Oral, QHS, Gonfa, Taye T, MD, 10 mg at 09/25/22 2048   diclofenac Sodium (VOLTAREN) 1 % topical gel 2 g, 2 g, Topical, QID, Gonfa, Taye T, MD, 2 g at 09/25/22 2048   DULoxetine (CYMBALTA) DR capsule 60 mg, 60 mg, Oral, Daily, Akintayo, Mojeed, MD, 60 mg at 09/25/22 0827   ferrous sulfate tablet 325 mg, 325 mg, Oral, BID WC, Gonfa, Taye T, MD, 325 mg at 09/25/22 1552   gabapentin (NEURONTIN) capsule 600 mg, 600 mg, Oral, TID, Alanda Slim, Taye T, MD, 600 mg at 09/25/22 2047   hydrOXYzine (ATARAX) tablet 25 mg, 25 mg, Oral, TID PRN, Candelaria Stagers T, MD, 25 mg at 09/25/22 1549  ibuprofen (ADVIL) tablet 600 mg, 600 mg, Oral, TID PRN, Alanda Slim, Taye T, MD, 600 mg at 09/25/22 1552   LORazepam (ATIVAN) tablet 0.5 mg, 0.5 mg, Oral, BID PRN, Alanda Slim, Taye T, MD, 0.5 mg at 09/25/22 1224   mometasone-formoterol (DULERA) 200-5 MCG/ACT inhaler 2 puff, 2 puff, Inhalation, BID, Alanda Slim, Taye T, MD, 2 puff at 09/25/22 2101   mupirocin cream (BACTROBAN) 2 %, , Topical, Daily, Marguerita Merles Cubero, DO, Given at 09/21/22 0903   OLANZapine (ZYPREXA) injection 10 mg, 10 mg, Intramuscular, Once PRN, Akintayo, Mojeed, MD   OLANZapine (ZYPREXA) tablet 15 mg, 15 mg, Oral, Daily, Cinderella, Margaret A, 15 mg at 09/25/22 2047   OLANZapine zydis (ZYPREXA) disintegrating tablet 5 mg, 5 mg, Oral, Q1200, Mariel Craft, MD, 5 mg at 09/25/22 1221   ondansetron (ZOFRAN-ODT) disintegrating tablet 4 mg, 4 mg, Oral, Q8H PRN, Sheikh, Omair Latif, DO, 4 mg at 09/17/22 1700   Oral care mouth rinse, 15 mL, Mouth Rinse, PRN, Osvaldo Shipper, MD   pantoprazole (PROTONIX) EC tablet 40 mg, 40 mg, Oral, QAC breakfast, Gonfa, Taye T, MD,  40 mg at 09/25/22 0827   polyethylene glycol (MIRALAX / GLYCOLAX) packet 17 g, 17 g, Oral, BID PRN, Alanda Slim, Taye T, MD, 17 g at 09/24/22 1336   prazosin (MINIPRESS) capsule 1 mg, 1 mg, Oral, QHS, Gonfa, Taye T, MD, 1 mg at 09/25/22 2049   senna-docusate (Senokot-S) tablet 1 tablet, 1 tablet, Oral, BID PRN, Candelaria Stagers T, MD, 1 tablet at 09/24/22 1336   sodium bicarbonate tablet 650 mg, 650 mg, Oral, TID, Candelaria Stagers T, MD, 650 mg at 09/25/22 2046  Allergies: Allergies  Allergen Reactions   Bee Venom Anaphylaxis   Coconut Flavor Anaphylaxis and Rash   Fish Allergy Anaphylaxis   Geodon [Ziprasidone Hcl] Other (See Comments)    Pt states that this medication causes paralysis of the mouth.     Haloperidol And Related Other (See Comments)    Pt states that this medication causes paralysis of the mouth, jaw locks up   Lithobid [Lithium] Other (See Comments)    Seizure-like activity    Roxicodone [Oxycodone] Other (See Comments)    Hallucinations    Seroquel [Quetiapine] Other (See Comments)    Severe drowsiness - Pt currently taking 12.5 mg BID, she is ok with this dose   Shellfish Allergy Anaphylaxis   Phenergan [Promethazine Hcl] Other (See Comments)    Chest pain     Prilosec [Omeprazole] Nausea And Vomiting and Other (See Comments)    Pt can take protonix with no problems    Sulfa Antibiotics Other (See Comments)    Chest pain    Tegretol [Carbamazepine] Nausea And Vomiting   Prozac [Fluoxetine] Other (See Comments)    Increased Depression and Suicidal thoughts   Tape Other (See Comments)    Skin tears, can only tolerate paper tape.   Tylenol [Acetaminophen] Rash and Other (See Comments)    Rash on face

## 2022-09-26 NOTE — Plan of Care (Signed)
  Problem: Education: Goal: Knowledge of General Education information will improve Description: Including pain rating scale, medication(s)/side effects and non-pharmacologic comfort measures Outcome: Progressing   Problem: Health Behavior/Discharge Planning: Goal: Ability to manage health-related needs will improve Outcome: Progressing   Problem: Clinical Measurements: Goal: Ability to maintain clinical measurements within normal limits will improve Outcome: Progressing Goal: Will remain free from infection Outcome: Progressing Goal: Diagnostic test results will improve Outcome: Progressing Goal: Cardiovascular complication will be avoided Outcome: Progressing   Problem: Activity: Goal: Risk for activity intolerance will decrease Outcome: Progressing   Problem: Coping: Goal: Level of anxiety will decrease Outcome: Progressing   Problem: Elimination: Goal: Will not experience complications related to bowel motility Outcome: Progressing   Problem: Role Relationship: Goal: Method of communication will improve Outcome: Progressing   Problem: Coping: Goal: Ability to adjust to condition or change in health will improve Outcome: Progressing   Problem: Tissue Perfusion: Goal: Adequacy of tissue perfusion will improve Outcome: Progressing

## 2022-09-27 DIAGNOSIS — F315 Bipolar disorder, current episode depressed, severe, with psychotic features: Secondary | ICD-10-CM | POA: Diagnosis not present

## 2022-09-27 MED ORDER — OLANZAPINE 5 MG PO TBDP
5.0000 mg | ORAL_TABLET | Freq: Two times a day (BID) | ORAL | Status: DC
  Administered 2022-09-27 – 2022-10-19 (×44): 5 mg via ORAL
  Filled 2022-09-27 (×44): qty 1

## 2022-09-27 NOTE — TOC Progression Note (Signed)
Transition of Care [facility]) - Progression Note    Patient Details  Name: [name] MRN: [id number] Date of Birth: [birth year]  [facility] CM/SW Contact  Lorri Frederick, LCSW Phone Number: 09/27/2022, 9:52 AM  Clinical Narrative:   Letter from Dr Gasper Sells faxed to Ms Ari[name].  P: [phone].  F: [phone].  TC Ms Dalene Carrow, confirmed she did receive.  The case has been staffed and they are not planning to pursue guardianship.           Expected Discharge Plan and Services                                               Social Determinants of Health (SDOH) Interventions SDOH Screenings   Food Insecurity: No Food Insecurity (07/29/2022)  Housing: Low Risk  (07/29/2022)  Transportation Needs: No Transportation Needs (07/29/2022)  Recent Concern: Transportation Needs - Unmet Transportation Needs (05/06/2022)  Utilities: Not At Risk (07/29/2022)  Alcohol Screen: Low Risk  (07/29/2022)  Depression (PHQ2-9): Medium Risk (03/10/2022)  Tobacco Use: Medium Risk (09/07/2022)    Readmission Risk Interventions    06/06/2022    3:38 PM  Readmission Risk Prevention Plan  Transportation Screening Complete  Medication Review (RN Care Manager) Complete  PCP or Specialist appointment within 3-5 days of discharge Complete  HRI or Home Care Consult Complete  SW Recovery Care/Counseling Consult Complete  Palliative Care Screening Not Applicable  Skilled Nursing Facility Not Applicable

## 2022-09-27 NOTE — Progress Notes (Signed)
This nurse called to the room as Pt was attempting to scratch forearm with suction canister holder. Pt was not redirectable initially as she was threatening to spit and throw items at staff. Clare Charon RN and this nurse were able to calm Pt down with non-pharm methods and agreeable to hand over the device and she got back in bed. Pt returned to calm and more so cooperative state and resting in bed. Safety sitter remains at bedside, will continue to monitor.

## 2022-09-27 NOTE — Plan of Care (Signed)
  Problem: Clinical Measurements: Goal: Cardiovascular complication will be avoided Outcome: Progressing   Problem: Elimination: Goal: Will not experience complications related to bowel motility Outcome: Progressing   Problem: Fluid Volume: Goal: Ability to maintain a balanced intake and output will improve Outcome: Progressing

## 2022-09-27 NOTE — Progress Notes (Signed)
PROGRESS NOTE    [name]  HQI:[id number] DOB: 21-Jul-1990 DOA: 08/22/2022 PCP: [facility]    Brief Narrative:  This 32 yrs old female with PMH significant of adjustment disorder with mixed anxiety and depressed mood, autism, bipolar 1 disorder, fragile x syndrome, seizures not on meds, schizoaffective disorder and multiple suicide attempts in the last year presented to the ED after ingesting numerous medications in a suicide attempt. She reported taking #30 metoprolol 25mg  tablets, #60 Hydroxyzine 25mg , #7 Lexapro 20mg , #21 Gabapentin 600mg , #7 Flexeril 10mg , #7 protonix 40mg  and #7 Prazosin 1mg . She was slightly bradycardic and hypotensive on EMS arrival which improved with fluids. Poison control was contacted and as she was within 2hrs of ingestion was given activated charcoal. Patient was placed under IVC in ED. Poison control also recommended ICU admission for close Qtc and electrolyte monitoring and possible pressors. She was intubated and started on vasopressor and glucagon. Also started on ceftriaxone for possible pneumonia. Patient was extubated on 7/18 and transferred to the floor on 7/19. She was transferred to [facility] service on 08/25/22. Psychiatry continues to follow. They recommend inpatient psychiatric hospitalization. Awaiting placement   Assessment & Plan:   Principal Problem:   Bipolar I disorder, current or most recent episode depressed, with psychotic features (HCC) Active Problems:   Asthma   GAD (generalized anxiety disorder)   Suicide attempt (HCC)   Overdose   Drug overdose    Suicidal Attempt with intentional overdose:  She has taken multiple medications including # 30 metoprolol 25mg  tablets, # 60 Hydroxyzine 25mg , # 7 Lexapro 20mg , # 21 Gabapentin 600mg , # 7 Flexeril 10mg , # 7 protonix 40mg  and # 7 Prazosin 1mg .  Poison control was contacted.   Received charcoal in the ED.  Initially required vasopressor,  glucagon,  intubation and mechanical  ventilation.  Currently on IVC. Psychiatry placed APS referral as well.  Further care as per psychiatry.     Anxiety and Depression, Schizoaffective Disorder, Adjustment Disorder, bipolar disorder, fibromyalgia and auditory hallucination:  Meds per psych. Continue one-to-one sitter at bedside as she continues to harm herself   Community-acquired pneumonia: Completed antibiotic treatment for community-acquired pneumonia.   HTN: -resumed Lopressor.   Sinus Tachycardia: Continue Lopressor.   -d dimer normal -orthostatics not done despite orders -troponin normal   Metabolic Acidosis: -resolved   Hypomagnesemia: -repleted   Microcytic Anemia: Iron deficiency anemia.  Low ferritin.  Continue oral iron.   History of Seizure Disorder: -Stable.  Not on meds.   History of Asthma: Continue nebulizers.  Stable.   Dysuria: She was  catheterized during ICU.   - recently treated with antibiotics.   Hypoalbuminemia: Encourage oral intake.   Obesity: Body mass index is 36.03 kg/m.  Would benefit from weight loss as outpatient.   DVT prophylaxis: SCDs Code Status: Full code Family Communication:No family at bed side. Disposition Plan: per psych and TOC   Status is: Inpatient Remains inpatient appropriate because: Admitted for suicidal ideations,  evaluated by psychiatry. Patient is awaiting psych inpatient hospitalization.   Consultants:  Psychiatry    Subjective: No further issues with numbness, HR improved on lopressor  Objective: Vitals:   09/26/22 0551 09/26/22 1657 09/27/22 0332 09/27/22 0806  BP: 115/85 116/74 112/78   Pulse: (!) 115 (!) 101 96   Resp: 18 17 13    Temp: 98.2 F (36.8 C) 98.3 F (36.8 C) 98 F (36.7 C)   TempSrc: Oral Oral Oral   SpO2: 98% 95%  96%  Weight:      Height:        Intake/Output Summary (Last 24 hours) at 09/27/2022 0959 Last data filed at 09/27/2022 0805 Gross per 24 hour  Intake 1080 ml  Output --  Net 1080 ml   Filed  Weights   09/01/22 0300 09/02/22 0500 09/03/22 0402  Weight: 98.8 kg 99 kg 98.2 kg    Examination:  In bed, NAD    Data Reviewed: I have personally reviewed following labs and imaging studies  CBC: Recent Labs  Lab 09/25/22 0748  WBC 8.4  HGB 11.8*  HCT 38.2  MCV 80.1  PLT 263   Basic Metabolic Panel: Recent Labs  Lab 09/25/22 0748  NA 137  K 3.6  CL 103  CO2 22  GLUCOSE 94  BUN 11  CREATININE 0.67  CALCIUM 8.9  MG 1.7  PHOS 4.2   GFR: Estimated Creatinine Clearance: 116.1 mL/min (by C-G formula based on SCr of 0.67 mg/dL). Liver Function Tests: Recent Labs  Lab 09/25/22 0748  AST 16  ALT 19  ALKPHOS 68  BILITOT 0.3  PROT 6.9  ALBUMIN 3.6   No results for input(s): "LIPASE", "AMYLASE" in the last 168 hours. No results for input(s): "AMMONIA" in the last 168 hours. Coagulation Profile: No results for input(s): "INR", "PROTIME" in the last 168 hours. Cardiac Enzymes: No results for input(s): "CKTOTAL", "CKMB", "CKMBINDEX", "TROPONINI" in the last 168 hours. BNP (last 3 results) No results for input(s): "PROBNP" in the last 8760 hours. HbA1C: No results for input(s): "HGBA1C" in the last 72 hours. CBG: No results for input(s): "GLUCAP" in the last 168 hours. Lipid Profile: No results for input(s): "CHOL", "HDL", "LDLCALC", "TRIG", "CHOLHDL", "LDLDIRECT" in the last 72 hours. Thyroid Function Tests: No results for input(s): "TSH", "T4TOTAL", "FREET4", "T3FREE", "THYROIDAB" in the last 72 hours. Anemia Panel: No results for input(s): "VITAMINB12", "FOLATE", "FERRITIN", "TIBC", "IRON", "RETICCTPCT" in the last 72 hours. Sepsis Labs: No results for input(s): "PROCALCITON", "LATICACIDVEN" in the last 168 hours.  No results found for this or any previous visit (from the past 240 hour(s)).   Radiology Studies: No results found.  Scheduled Meds:  cholecalciferol  400 Units Oral Daily   cyclobenzaprine  10 mg Oral QHS   diclofenac Sodium  2 g  Topical QID   DULoxetine  60 mg Oral Daily   ferrous sulfate  325 mg Oral BID WC   gabapentin  600 mg Oral TID   metoprolol tartrate  12.5 mg Oral BID   mometasone-formoterol  2 puff Inhalation BID   mupirocin cream   Topical Daily   OLANZapine  15 mg Oral Daily   OLANZapine zydis  5 mg Oral Q1200   pantoprazole  40 mg Oral QAC breakfast   prazosin  1 mg Oral QHS   sodium bicarbonate  650 mg Oral TID   Continuous Infusions:  sodium chloride Stopped (08/24/22 1347)     LOS: 36 days    Time spent: 35 mins    Joseph Art, DO [facility]   If 7PM-7AM, please contact night-coverage

## 2022-09-27 NOTE — Plan of Care (Signed)
  Problem: Education: Goal: Knowledge of General Education information will improve Description: Including pain rating scale, medication(s)/side effects and non-pharmacologic comfort measures Outcome: Progressing   Problem: Health Behavior/Discharge Planning: Goal: Ability to manage health-related needs will improve Outcome: Progressing   Problem: Clinical Measurements: Goal: Ability to maintain clinical measurements within normal limits will improve Outcome: Progressing Goal: Will remain free from infection Outcome: Progressing Goal: Diagnostic test results will improve Outcome: Progressing Goal: Cardiovascular complication will be avoided Outcome: Progressing   Problem: Activity: Goal: Risk for activity intolerance will decrease Outcome: Progressing   Problem: Coping: Goal: Level of anxiety will decrease Outcome: Progressing   Problem: Elimination: Goal: Will not experience complications related to bowel motility Outcome: Progressing   Problem: Pain Managment: Goal: General experience of comfort will improve Outcome: Progressing   Problem: Safety: Goal: Ability to remain free from injury will improve Outcome: Progressing   Problem: Skin Integrity: Goal: Risk for impaired skin integrity will decrease Outcome: Progressing   Problem: Role Relationship: Goal: Method of communication will improve Outcome: Progressing   Problem: Coping: Goal: Ability to adjust to condition or change in health will improve Outcome: Progressing   Problem: Fluid Volume: Goal: Ability to maintain a balanced intake and output will improve Outcome: Progressing   Problem: Health Behavior/Discharge Planning: Goal: Ability to identify and utilize available resources and services will improve Outcome: Progressing Goal: Ability to manage health-related needs will improve Outcome: Progressing   Problem: Metabolic: Goal: Ability to maintain appropriate glucose levels will  improve Outcome: Progressing   Problem: Nutritional: Goal: Maintenance of adequate nutrition will improve Outcome: Progressing Goal: Progress toward achieving an optimal weight will improve Outcome: Progressing   Problem: Skin Integrity: Goal: Risk for impaired skin integrity will decrease Outcome: Progressing   Problem: Tissue Perfusion: Goal: Adequacy of tissue perfusion will improve Outcome: Progressing

## 2022-09-27 NOTE — Consult Note (Addendum)
[name] Psychiatry Consult Evaluation  Service Date: September 27, 2022 LOS:  LOS: 36 days    Primary Psychiatric Diagnoses   Schizoaffective disorder, bipolar type  GAD  Borderline personality disorder  Assessment  [name] is a 32 y.o. female with a past psychiatric history of borderline personality disorder, schizoaffective disorder bipolar type, and PTSD.  She has had numerous behavioral health admissions and an extensive history of suicide attempts.  Patient presents to [facility] ED for an attempted overdose on her prescription medications.  Briefly, this pt has had ~50 ED visits or psych hospitalizations during 2024; this continues a pattern of an inability to maintain safety in the outpatient setting going back multiple years. The vast majority of her suicide attempts have been via overdose on psychotropic medication. She has failed multiple levels of outpatient management up to and including CST; she has been declined by multiple psychiatric facilities due to futility, and she has been discharged by her CST team as of 8/8. She is currently on the [facility] priority wait list. She is not eligible for ACT services through [facility] due to BPD diagnosis.   We are trying to simplify patient's medication regimen to avoid polypharmacy especially in the setting of her recent overdose. Earlier in hospitalization during a period of improvement (roughly 8/1 to 8/8), we discussed medication management if and when she does return home including closer monitoring, less access to her medications through a locked cabinet, and her ex-boyfriend being more in charge of her medications in which was confirmed with her ex-boyfriend before being dropped by her CST.  After notification of discontinuation from CST services and ineligibility for ACT services, patient had acute worsening of reported hallucinations, suicidality, homicidality and agitation (fear of abandonment is central feature in BPD) and  consideration of community f/u was terminated. Patient has an extensive history of ER visits and hospitalizations in which she has displayed aggression, psychosis, and danger to self even while in proximity of a Recruitment consultant. Patient also shows high level of lethality, intensity, and frequency of suicidal attempts with multiple inpatient psychiatric admissions with little benefit so we continue to recommend [facility]; given wait list will also refer to other psychiatric hospitals. Patient is currently on the [facility] priority wait list and we are working closely with LSW with bed availability.   8/22: Patient's presentation remains similar as yesterday's assessment, unable to contract for safety and making statements of passive SI and HI. Patient feels depressed and hopeless this morning, less tearful. Encouraged patient to contact her new case manager to establish report. We will continue to monitor for symptomatic improvement and optimize her psychiatric medication list as appropriate.  Diagnoses:  Active Hospital problems: Principal Problem:   Bipolar I disorder, current or most recent episode depressed, with psychotic features (HCC) Active Problems:   Asthma   GAD (generalized anxiety disorder)   Suicide attempt (HCC)   Overdose   Drug overdose    Plan   ## Psychiatric Medication Recommendations:  -- Increase Zyprexa to 5 mg po before lunch and supper for mood stabilization during the day -- Continue Zyprexa 15 mg po nightly for psychosis  -- Continue Cymbalta 60 mg daily -- Continue Gabapentin 600 mg TID for agitation, mood/anxiety -- continue prazosin 1 mg at bedtime for PTSD  Agitation Protocol -- PRN Zyprexa 10 mg IM for agitation -- PRN Ativan 0.5 mg oral BID PRN for agitation -- PRN Atarax 25 mg TID for anxiety, agitation  ## Medical Decision Making  Capacity:   Capacity was not formally addressed during this encounter  ## Further Work-up:  -- Per primary -- most recent EKG on  08/30/2022 had QtC of 469  ## Disposition:  -- We recommend inpatient psychiatric hospitalization after medical hospitalization. Patient has been involuntarily committed on 09/04/2022.  -- IVC renewed on 8/21 -- Patient remains on [facility] priority wait list  ## Behavioral / Environmental:  --  To minimize splitting of staff, assign one staff person to communicate all information from the team when feasible. -- Apply the established behavioral rules and protocols consistently throughout hospitalization (see nursing care order for details). Ensure that all staff members are aware of and adhere to the same guidelines to avoid confusion and mixed messages.  ## Safety and Observation Level:  - Based on my clinical evaluation, I estimate the patient to be at elevated risk of self harm in the current setting - At this time, we recommend continuing a 1:1 level of observation. This decision is based on my review of the chart including patient's history and current presentation, interview of the patient, mental status examination, and consideration of suicide risk including evaluating suicidal ideation, plan, intent, suicidal or self-harm behaviors, risk factors, and protective factors. This judgment is based on our ability to directly address suicide risk, implement suicide prevention strategies and develop a safety plan while the patient is in the clinical setting. Please contact our team if there is a concern that risk level has changed.  Suicide risk assessment  Patient has following modifiable risk factors for suicide: active suicidal ideation, under treated depression , recklessness, and lack of access to outpatient mental health resources, which we are addressing by continuing adequate safety precautions while she is hospitalized and adjusting medications   Patient has following non-modifiable or demographic risk factors for suicide: early widowhood, history of suicide attempt, history of self harm  behavior, and psychiatric hospitalization  Patient has the following protective factors against suicide: Supportive friends  Thank you for this consult request. Recommendations have been communicated to the primary team.  We will follow at this time.    Lance Muss, MD   Total Time Spent in Direct Patient Care:  I personally spent 30 minutes on the unit in direct patient care. The direct patient care time included face-to-face time with the patient, reviewing the patient's chart, communicating with other professionals, and coordinating care. Greater than 50% of this time was spent in counseling or coordinating care with the patient regarding goals of hospitalization, psycho-education, and discharge planning needs.    Psychiatric and Social History   Relevant Aspects of Hospital Course:  Admitted on 08/22/2022 for suicide attempt by overdose on her prescription medication.   Patient Report:  Patient seen this AM. Patient is tearful regarding her current situation. She reports having HI towards her family and states they are taking everything away from her. She denies having a plan with the HI. Patient reports passive SI, stating "I just do not want to be alive anymore". She states that Fayrene Fearing visited her yesterday (although this was not documented anywhere in the chart) in the hospital. She reports poor sleep and fair appetite. She denies adverse effects from her medications. I provided the patient her new case manager's number and encouraged her to contact her to establish a relationship as she will be assisting [name] her disposition. I discussed with her the behavioral contract that we created yesterday and discouraged the self harming behaviors.  Collateral 8/14: called Fayrene Fearing, no  response 8/12: tried to call Fayrene Fearing with no response.  8/9: called Debra with CST - stated she would be happy to conduct a termination session with Dezaria if she reached out . She has not been called by [name] during  this hospitalization.  8/9: called Dr. Pershing Proud with [facility] to discuss pt's recent decompensation  8/5: spoke with Fayrene Fearing  [facility] [employer] in to note above   Past Psychiatric History: Previous Psych Diagnoses: Schizoaffective disorder bipolar type, Anxiety, Autism spectrum disorder, PTSD, Borderline personality disorder Prior inpatient treatment: multiple, 15 ED visits and 4 inpatient admissions since 06/2022 (~50 total in 2024), admitted to [facility] from 07/29/22-08/02/2022, has been to [facility] for eight months in 2016, and was discharged from inpatient facility past week Current/prior outpatient treatment: [facility], CST team twice a week Prior rehab hx: denies multiple Psychotherapy hx: [facility] History of suicide: 20-30 times by overdosing on pills, cutting or drinking 409 disinfectant History of homicide or aggression: denies Psychiatric medication history:  Previously trials of Tegretol, Haldol, Clozaril, Depakote, Invega, Lamictal, Seroquel, Asenapine, Abilify, vraylar (worsened suicidal thoughts), Lexapro, Zoloft  Neuromodulation history: denies  Social History:  Marital status: Widowed Children: 47 year old daughter Lives: with ex-boyfriend Fayrene Fearing, in two story home Employment: Disability, 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  Access to weapons: None   Exam Findings   Psychiatric Specialty Exam:  Presentation  General Appearance: Disheveled  Eye Contact:None  Speech:Clear and Coherent  Speech Volume:Normal  Handedness:Right   Mood and Affect  Mood:-- "Not okay"  Affect:-- Congruent   Thought Process  Thought Processes:Coherent  Descriptions of Associations:Intact  Orientation:Full (Time, Place and Person)  Thought Content:Logical  History of Schizophrenia/Schizoaffective disorder: Yes  Duration of Psychotic Symptoms:Greater than six months  Hallucinations: Denies  Ideas of  Reference:Paranoia  Suicidal Thoughts: Reports passive SI and thoughts of self harm  Homicidal Thoughts: Reports HI towards parents   Sensorium  Memory:Immediate Good; Recent Good; Remote Good  Judgment:Poor  Insight: Lacking   Executive Functions  Concentration:Fair  Attention Span:Fair  Recall:Fair  Fund of Knowledge:Fair  Language:Good   Psychomotor Activity  Psychomotor Activity: Normal  Assets  Assets:Communication Skills   Sleep  Sleep: Fair    Physical Exam: Vital signs:  Temp:  [98 F (36.7 C)-98.3 F (36.8 C)] 98 F (36.7 C) (08/22 0332) Pulse Rate:  [96-101] 96 (08/22 0332) Resp:  [13-17] 13 (08/22 0332) BP: (112-116)/(74-78) 112/78 (08/22 0332) SpO2:  [95 %-96 %] 96 % (08/22 0806) Physical Exam Vitals and nursing note reviewed. Exam conducted with a chaperone present.  Constitutional:      General: She is not in acute distress.    Appearance: She is obese. She is not ill-appearing.  HENT:     Head: Normocephalic.  Cardiovascular:     Rate and Rhythm: Normal rate.  Pulmonary:     Effort: Pulmonary effort is normal. No respiratory distress.  Neurological:     General: No focal deficit present.     Mental Status: She is alert.  Psychiatric:        Attention and Perception: Attention normal.        Speech: Speech normal.     Blood pressure 112/78, pulse 96, temperature 98 F (36.7 C), temperature source Oral, resp. rate 13, height 5\' 5"  (1.651 m), weight 98.2 kg, last menstrual period 08/08/2022, SpO2 96%. Body mass index is 36.03 kg/m.   Other History   These have  been pulled in through the EMR, reviewed, and updated if appropriate.   Family History:  The patient's family history includes Asthma in her father; Mental illness in her father; PDD in her brother; Seizures in her brother.  Medical History: Past Medical History:  Diagnosis Date   Acid reflux    Adjustment disorder with mixed anxiety and depressed mood 01/31/2022    Adjustment disorder with mixed disturbance of emotions and conduct 08/03/2019   Anxiety    Asthma    last attack 03/13/15 or 03/14/15   Autism    Bipolar 1 disorder, depressed, severe (HCC) 07/25/2021   Carrier of fragile X syndrome    Chronic constipation    Depression    Drug-seeking behavior    Essential tremor    Headache    Ineffective individual coping 05/16/2022   Insomnia 01/12/2022   Intentional drug overdose (HCC) 06/05/2022   Neuromuscular disorder (HCC)    Normocytic anemia 06/05/2022   Overdose 07/22/2017   Overdose of acetaminophen 07/2017   and other meds   Overdose, intentional self-harm, initial encounter (HCC) 07/20/2021   Paranoia (HCC) 04/22/2021   Personality disorder (HCC)    Purposeful non-suicidal drug ingestion (HCC) 06/27/2021   Schizo-affective psychosis (HCC)    Schizoaffective disorder (HCC) 07/29/2022   Schizoaffective disorder, bipolar type (HCC)    Seizures (HCC)    Last seizure December 2017   Skin erythema 04/27/2022   Sleep apnea    Suicidal behavior 07/25/2021   Suicidal ideation    Suicide (HCC) 07/01/2021   Suicide attempt (HCC) 07/04/2021    Surgical History: Past Surgical History:  Procedure Laterality Date   MOUTH SURGERY  2009 or 2010    Medications:   Current Facility-Administered Medications:    0.9 %  sodium chloride infusion, 250 mL, Intravenous, Continuous, Gonfa, Taye T, MD, Stopped at 08/24/22 1347   albuterol (PROVENTIL) (2.5 MG/3ML) 0.083% nebulizer solution 3 mL, 3 mL, Inhalation, Q6H PRN, Alanda Slim, Taye T, MD   cholecalciferol (VITAMIN D3) 10 MCG (400 UNIT) tablet 400 Units, 400 Units, Oral, Daily, Cinderella, Margaret A, 400 Units at 09/26/22 1716   cyclobenzaprine (FLEXERIL) tablet 10 mg, 10 mg, Oral, QHS, Gonfa, Taye T, MD, 10 mg at 09/26/22 2129   diclofenac Sodium (VOLTAREN) 1 % topical gel 2 g, 2 g, Topical, QID, Gonfa, Taye T, MD, 2 g at 09/26/22 2131   DULoxetine (CYMBALTA) DR capsule 60 mg, 60 mg, Oral, Daily,  Akintayo, Mojeed, MD, 60 mg at 09/26/22 0835   ferrous sulfate tablet 325 mg, 325 mg, Oral, BID WC, Gonfa, Taye T, MD, 325 mg at 09/26/22 1716   gabapentin (NEURONTIN) capsule 600 mg, 600 mg, Oral, TID, Alanda Slim, Taye T, MD, 600 mg at 09/26/22 2129   hydrOXYzine (ATARAX) tablet 25 mg, 25 mg, Oral, TID PRN, Alanda Slim, Taye T, MD, 25 mg at 09/26/22 0936   ibuprofen (ADVIL) tablet 600 mg, 600 mg, Oral, TID PRN, Alanda Slim, Taye T, MD, 600 mg at 09/25/22 1552   LORazepam (ATIVAN) tablet 0.5 mg, 0.5 mg, Oral, BID PRN, Alanda Slim, Taye T, MD, 0.5 mg at 09/26/22 2129   metoprolol tartrate (LOPRESSOR) tablet 12.5 mg, 12.5 mg, Oral, BID, Vann, Jessica U, DO, 12.5 mg at 09/26/22 2128   mometasone-formoterol (DULERA) 200-5 MCG/ACT inhaler 2 puff, 2 puff, Inhalation, BID, Alanda Slim, Taye T, MD, 2 puff at 09/27/22 0806   mupirocin cream (BACTROBAN) 2 %, , Topical, Daily, Marguerita Merles Custer, DO, Given at 09/21/22 0903   OLANZapine (ZYPREXA) injection 10 mg, 10  mg, Intramuscular, Once PRN, Akintayo, Mojeed, MD   OLANZapine (ZYPREXA) tablet 15 mg, 15 mg, Oral, Daily, Cinderella, Margaret A, 15 mg at 09/26/22 2129   OLANZapine zydis (ZYPREXA) disintegrating tablet 5 mg, 5 mg, Oral, Q1200, Mariel Craft, MD, 5 mg at 09/26/22 1151   ondansetron (ZOFRAN-ODT) disintegrating tablet 4 mg, 4 mg, Oral, Q8H PRN, Sheikh, Omair Latif, DO, 4 mg at 09/17/22 1700   Oral care mouth rinse, 15 mL, Mouth Rinse, PRN, Osvaldo Shipper, MD   pantoprazole (PROTONIX) EC tablet 40 mg, 40 mg, Oral, QAC breakfast, Gonfa, Taye T, MD, 40 mg at 09/26/22 0835   polyethylene glycol (MIRALAX / GLYCOLAX) packet 17 g, 17 g, Oral, BID PRN, Alanda Slim, Taye T, MD, 17 g at 09/24/22 1336   prazosin (MINIPRESS) capsule 1 mg, 1 mg, Oral, QHS, Gonfa, Taye T, MD, 1 mg at 09/26/22 2128   senna-docusate (Senokot-S) tablet 1 tablet, 1 tablet, Oral, BID PRN, Candelaria Stagers T, MD, 1 tablet at 09/24/22 1336   sodium bicarbonate tablet 650 mg, 650 mg, Oral, TID, Candelaria Stagers T, MD, 650 mg  at 09/26/22 2129  Allergies: Allergies  Allergen Reactions   Bee Venom Anaphylaxis   Coconut Flavor Anaphylaxis and Rash   Fish Allergy Anaphylaxis   Geodon [Ziprasidone Hcl] Other (See Comments)    Pt states that this medication causes paralysis of the mouth.     Haloperidol And Related Other (See Comments)    Pt states that this medication causes paralysis of the mouth, jaw locks up   Lithobid [Lithium] Other (See Comments)    Seizure-like activity    Roxicodone [Oxycodone] Other (See Comments)    Hallucinations    Seroquel [Quetiapine] Other (See Comments)    Severe drowsiness - Pt currently taking 12.5 mg BID, she is ok with this dose   Shellfish Allergy Anaphylaxis   Phenergan [Promethazine Hcl] Other (See Comments)    Chest pain     Prilosec [Omeprazole] Nausea And Vomiting and Other (See Comments)    Pt can take protonix with no problems    Sulfa Antibiotics Other (See Comments)    Chest pain    Tegretol [Carbamazepine] Nausea And Vomiting   Prozac [Fluoxetine] Other (See Comments)    Increased Depression and Suicidal thoughts   Tape Other (See Comments)    Skin tears, can only tolerate paper tape.   Tylenol [Acetaminophen] Rash and Other (See Comments)    Rash on face

## 2022-09-28 DIAGNOSIS — T50902A Poisoning by unspecified drugs, medicaments and biological substances, intentional self-harm, initial encounter: Secondary | ICD-10-CM | POA: Diagnosis not present

## 2022-09-28 DIAGNOSIS — F411 Generalized anxiety disorder: Secondary | ICD-10-CM | POA: Diagnosis not present

## 2022-09-28 DIAGNOSIS — T1491XA Suicide attempt, initial encounter: Secondary | ICD-10-CM | POA: Diagnosis not present

## 2022-09-28 DIAGNOSIS — F315 Bipolar disorder, current episode depressed, severe, with psychotic features: Secondary | ICD-10-CM | POA: Diagnosis not present

## 2022-09-28 NOTE — Consult Note (Signed)
[name] Consult Evaluation  Service Date: September 28, 2022 LOS:  LOS: 37 days    Primary Psychiatric Diagnoses   Schizoaffective disorder, bipolar type  GAD  Borderline personality disorder  Assessment  [name] is a 32 y.o. female with a past psychiatric history of borderline personality disorder, schizoaffective disorder bipolar type, and PTSD.  She has had numerous behavioral health admissions and an extensive history of suicide attempts.  Patient presents to [facility] ED for an attempted overdose on her prescription medications.  Briefly, this pt has had ~50 ED visits or psych hospitalizations during 2024; this continues a pattern of an inability to maintain safety in the outpatient setting going back multiple years. The vast majority of her suicide attempts have been via overdose on psychotropic medication. She has failed multiple levels of outpatient management up to and including CST; she has been declined by multiple psychiatric facilities due to futility, and she has been discharged by her CST team as of 8/8. She is currently on the [facility] priority wait list. She is not eligible for ACT services through [facility] due to BPD diagnosis.   We are trying to simplify patient's medication regimen to avoid polypharmacy especially in the setting of her recent overdose. Earlier in hospitalization during a period of improvement (roughly 8/1 to 8/8), we discussed medication management if and when she does return home including closer monitoring, less access to her medications through a locked cabinet, and her ex-boyfriend being more in charge of her medications in which was confirmed with her ex-boyfriend before being dropped by her CST.  After notification of discontinuation from CST services and ineligibility for ACT services, patient had acute worsening of reported hallucinations, suicidality, homicidality and agitation (fear of abandonment is central feature in BPD) and  consideration of community f/u was terminated. Patient has an extensive history of ER visits and hospitalizations in which she has displayed aggression, psychosis, and danger to self even while in proximity of a Recruitment consultant. Patient also shows high level of lethality, intensity, and frequency of suicidal attempts with multiple inpatient psychiatric admissions with little benefit so we continue to recommend [facility]; given wait list will also refer to other psychiatric hospitals. Patient is currently on the [facility] priority wait list and we are working closely with LSW with bed availability.   8/22: Patient more tired this morning, denying SI, HI, and AVH. We will increase patient's Zyprexa dose to 5 mg before lunch and supper to help aid with mood stabilization throughout the day. If effective, we can decrease the nightly dose to prevent risk of oversedation. I continue to discuss coping skills when patient's has behaviors concerning of self harm. She is able to voice coping strategies but shows poor judgement as there is documentation of self injurious behaviors yesterday night of scratching herself with a suction canister holder. We continue to work with patient's Child psychotherapist for disposition planning.  Diagnoses:  Active Hospital problems: Principal Problem:   Bipolar I disorder, current or most recent episode depressed, with psychotic features (HCC) Active Problems:   Asthma   GAD (generalized anxiety disorder)   Suicide attempt (HCC)   Overdose   Drug overdose    Plan   ## Psychiatric Medication Recommendations:  -- Increase Zyprexa to 5 mg po before lunch and supper for mood stabilization during the day -- Continue Zyprexa 15 mg po nightly for psychosis  -- Continue Cymbalta 60 mg daily -- Continue Gabapentin 600 mg TID for agitation, mood/anxiety --  continue prazosin 1 mg at bedtime for PTSD  Agitation Protocol -- PRN Zyprexa 10 mg IM for agitation -- PRN Ativan 0.5 mg oral BID PRN for  agitation -- PRN Atarax 25 mg TID for anxiety, agitation  ## Medical Decision Making Capacity:   Capacity was not formally addressed during this encounter  ## Further Work-up:  -- Per primary -- most recent EKG on 08/30/2022 had QtC of 469  ## Disposition:  -- We recommend inpatient psychiatric hospitalization after medical hospitalization. Patient has been involuntarily committed on 09/04/2022.  -- IVC renewed on 8/21 -- Patient remains on [facility] priority wait list  ## Behavioral / Environmental:  --  To minimize splitting of staff, assign one staff person to communicate all information from the team when feasible. -- Apply the established behavioral rules and protocols consistently throughout hospitalization (see nursing care order for details). Ensure that all staff members are aware of and adhere to the same guidelines to avoid confusion and mixed messages.  ## Safety and Observation Level:  - Based on my clinical evaluation, I estimate the patient to be at elevated risk of self harm in the current setting - At this time, we recommend continuing a 1:1 level of observation. This decision is based on my review of the chart including patient's history and current presentation, interview of the patient, mental status examination, and consideration of suicide risk including evaluating suicidal ideation, plan, intent, suicidal or self-harm behaviors, risk factors, and protective factors. This judgment is based on our ability to directly address suicide risk, implement suicide prevention strategies and develop a safety plan while the patient is in the clinical setting. Please contact our team if there is a concern that risk level has changed.  Suicide risk assessment  Patient has following modifiable risk factors for suicide: active suicidal ideation, under treated depression , recklessness, and lack of access to outpatient mental health resources, which we are addressing by continuing adequate  safety precautions while she is hospitalized and adjusting medications   Patient has following non-modifiable or demographic risk factors for suicide: early widowhood, history of suicide attempt, history of self harm behavior, and psychiatric hospitalization  Patient has the following protective factors against suicide: Supportive friends  Thank you for this consult request. Recommendations have been communicated to the primary team.  We will follow at this time.    Lance Muss, MD   Total Time Spent in Direct Patient Care:  I personally spent 30 minutes on the unit in direct patient care. The direct patient care time included face-to-face time with the patient, reviewing the patient's chart, communicating with other professionals, and coordinating care. Greater than 50% of this time was spent in counseling or coordinating care with the patient regarding goals of hospitalization, psycho-education, and discharge planning needs.    Psychiatric and Social History   Relevant Aspects of Hospital Course:  Admitted on 08/22/2022 for suicide attempt by overdose on her prescription medication.   Patient Report:  Patient seen this AM. She feels tired this morning. She denies issues with sleep and appetite, having slept through the night and ate her breakfast. I asked the patient what happened last night that caused her to scratch herself and she states "because I was bored". We discussed different activities the patient can engage in with she has urges of self harm. She denies SI, HI, and AVH. She agrees to increase her daytime Zyprexa dose. She denies adverse effects from the medication. Discussed how she remains on the  [facility] wait list.  Collateral 8/14: called Fayrene Fearing, no response 8/12: tried to call Fayrene Fearing with no response.  8/9: called Debra with CST - stated she would be happy to conduct a termination session with [place] if she reached out . She has not been called by [name] during this  hospitalization.  8/9: called Dr. Pershing Proud with [facility] to discuss pt's recent decompensation  8/5: spoke with Fayrene Fearing  [facility] [employer] in to note above   Past Psychiatric History: Previous Psych Diagnoses: Schizoaffective disorder bipolar type, Anxiety, Autism spectrum disorder, PTSD, Borderline personality disorder Prior inpatient treatment: multiple, 15 ED visits and 4 inpatient admissions since 06/2022 (~50 total in 2024), admitted to [facility] from 07/29/22-08/02/2022, has been to [facility] for eight months in 2016, and was discharged from inpatient facility past week Current/prior outpatient treatment: [facility], CST team twice a week Prior rehab hx: denies multiple Psychotherapy hx: [facility] History of suicide: 20-30 times by overdosing on pills, cutting or drinking 409 disinfectant History of homicide or aggression: denies Psychiatric medication history:  Previously trials of Tegretol, Haldol, Clozaril, Depakote, Invega, Lamictal, Seroquel, Asenapine, Abilify, vraylar (worsened suicidal thoughts), Lexapro, Zoloft  Neuromodulation history: denies  Social History:  Marital status: Widowed Children: 51 year old daughter Lives: with ex-boyfriend Fayrene Fearing, in two story home Employment: Disability, 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  Access to weapons: None   Exam Findings   Psychiatric Specialty Exam:  Presentation  General Appearance: Disheveled  Eye Contact:None  Speech:Clear and Coherent  Speech Volume:Normal  Handedness:Right   Mood and Affect  Mood:-- "Fine"  Affect:-- Congruent    Thought Process  Thought Processes:Coherent  Descriptions of Associations:Intact  Orientation:Full (Time, Place and Person)  Thought Content:Logical  History of Schizophrenia/Schizoaffective disorder: Yes  Duration of Psychotic Symptoms:Greater than six months  Hallucinations: Denies  Ideas of  Reference:Paranoia  Suicidal Thoughts: Denies  Homicidal Thoughts: Denies   Sensorium  Memory:Immediate Good; Recent Good; Remote Good  Judgment:Poor  Insight: Lacking   Executive Functions  Concentration:Fair  Attention Span:Fair  Recall:Fair  Fund of Knowledge:Fair  Language:Good   Psychomotor Activity  Psychomotor Activity: Normal  Assets  Assets:Communication Skills   Sleep  Sleep: Fair    Physical Exam: Vital signs:  SpO2:  [98 %] 98 % (08/23 0818) Physical Exam Vitals and nursing note reviewed. Exam conducted with a chaperone present.  Constitutional:      General: She is not in acute distress.    Appearance: She is obese. She is not ill-appearing.  HENT:     Head: Normocephalic.  Cardiovascular:     Rate and Rhythm: Normal rate.  Pulmonary:     Effort: Pulmonary effort is normal. No respiratory distress.  Neurological:     General: No focal deficit present.     Mental Status: She is alert.  Psychiatric:        Attention and Perception: Attention normal.        Speech: Speech normal.     Blood pressure 112/78, pulse 96, temperature 98 F (36.7 C), temperature source Oral, resp. rate 13, height 5\' 5"  (1.651 m), weight 98.2 kg, last menstrual period 08/08/2022, SpO2 98%. Body mass index is 36.03 kg/m.   Other History   These have been pulled in through the EMR, reviewed, and updated if appropriate.   Family History:  The patient's family history includes Asthma in her father; Mental illness in her father; PDD in her brother; Seizures  in her brother.  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    Chronic  constipation    Depression    Drug-seeking behavior    Essential tremor    Headache    Ineffective individual coping 05/16/2022   Insomnia 01/12/2022   Intentional drug overdose (HCC) 06/05/2022   Neuromuscular disorder (HCC)    Normocytic anemia 06/05/2022   Overdose 07/22/2017   Overdose of acetaminophen 07/2017   and other meds   Overdose, intentional self-harm, initial encounter (HCC) 07/20/2021   Paranoia (HCC) 04/22/2021   Personality disorder (HCC)    Purposeful non-suicidal drug ingestion (HCC) 06/27/2021   Schizo-affective psychosis (HCC)    Schizoaffective disorder (HCC) 07/29/2022   Schizoaffective disorder, bipolar type (HCC)    Seizures (HCC)    Last seizure December 2017   Skin erythema 04/27/2022   Sleep apnea    Suicidal behavior 07/25/2021   Suicidal ideation    Suicide (HCC) 07/01/2021   Suicide attempt (HCC) 07/04/2021    Surgical History: Past Surgical History:  Procedure Laterality Date   MOUTH SURGERY  2009 or 2010    Medications:   Current Facility-Administered Medications:    0.9 %  sodium chloride infusion, 250 mL, Intravenous, Continuous, Gonfa, Taye T, MD, Stopped at 08/24/22 1347   albuterol (PROVENTIL) (2.5 MG/3ML) 0.083% nebulizer solution 3 mL, 3 mL, Inhalation, Q6H PRN, Gonfa, Taye T, MD   cholecalciferol (VITAMIN D3) 10 MCG (400 UNIT) tablet 400 Units, 400 Units, Oral, Daily, Cinderella, Margaret A, 400 Units at 09/28/22 0904   cyclobenzaprine (FLEXERIL) tablet 10 mg, 10 mg, Oral, QHS, Gonfa, Taye T, MD, 10 mg at 09/27/22 2152   diclofenac Sodium (VOLTAREN) 1 % topical gel 2 g, 2 g, Topical, QID, Gonfa, Taye T, MD, 2 g at 09/28/22 0903   DULoxetine (CYMBALTA) DR capsule 60 mg, 60 mg, Oral, Daily, Akintayo, Mojeed, MD, 60 mg at 09/28/22 0904   ferrous sulfate tablet 325 mg, 325 mg, Oral, BID WC, Gonfa, Taye T, MD, 325 mg at 09/28/22 0865   gabapentin (NEURONTIN) capsule 600 mg, 600 mg, Oral, TID, Alanda Slim, Taye T, MD, 600 mg at 09/28/22  7846   hydrOXYzine (ATARAX) tablet 25 mg, 25 mg, Oral, TID PRN, Alanda Slim, Taye T, MD, 25 mg at 09/28/22 0904   ibuprofen (ADVIL) tablet 600 mg, 600 mg, Oral, TID PRN, Alanda Slim, Taye T, MD, 600 mg at 09/27/22 1506   LORazepam (ATIVAN) tablet 0.5 mg, 0.5 mg, Oral, BID PRN, Alanda Slim, Taye T, MD, 0.5 mg at 09/27/22 2003   metoprolol tartrate (LOPRESSOR) tablet 12.5 mg, 12.5 mg, Oral, BID, Vann, Jessica U, DO, 12.5 mg at 09/28/22 0904   mometasone-formoterol (DULERA) 200-5 MCG/ACT inhaler 2 puff, 2 puff, Inhalation, BID, Alanda Slim, Taye T, MD, 2 puff at 09/28/22 0818   mupirocin cream (BACTROBAN) 2 %, , Topical, Daily, Marguerita Merles Richmond, DO, Given at 09/28/22 0905   OLANZapine (ZYPREXA) injection 10 mg, 10 mg, Intramuscular, Once PRN, Akintayo, Mojeed, MD   OLANZapine (ZYPREXA) tablet 15 mg, 15 mg, Oral, Daily, Cinderella, Margaret A, 15 mg at 09/27/22 2004   OLANZapine zydis (ZYPREXA) disintegrating tablet 5 mg,  5 mg, Oral, BID AC, Mariel Craft, MD, 5 mg at 09/27/22 1718   ondansetron (ZOFRAN-ODT) disintegrating tablet 4 mg, 4 mg, Oral, Q8H PRN, Marguerita Merles Latif, DO, 4 mg at 09/17/22 1700   Oral care mouth rinse, 15 mL, Mouth Rinse, PRN, Osvaldo Shipper, MD   pantoprazole (PROTONIX) EC tablet 40 mg, 40 mg, Oral, QAC breakfast, Alanda Slim, Taye T, MD, 40 mg at 09/28/22 0904   polyethylene glycol (MIRALAX / GLYCOLAX) packet 17 g, 17 g, Oral, BID PRN, Candelaria Stagers T, MD, 17 g at 09/27/22 2253   prazosin (MINIPRESS) capsule 1 mg, 1 mg, Oral, QHS, Gonfa, Taye T, MD, 1 mg at 09/27/22 2153   senna-docusate (Senokot-S) tablet 1 tablet, 1 tablet, Oral, BID PRN, Candelaria Stagers T, MD, 1 tablet at 09/27/22 2253   sodium bicarbonate tablet 650 mg, 650 mg, Oral, TID, Candelaria Stagers T, MD, 650 mg at 09/28/22 2841  Allergies: Allergies  Allergen Reactions   Bee Venom Anaphylaxis   Coconut Flavor Anaphylaxis and Rash   Fish Allergy Anaphylaxis   Geodon [Ziprasidone Hcl] Other (See Comments)    Pt states that this medication  causes paralysis of the mouth.     Haloperidol And Related Other (See Comments)    Pt states that this medication causes paralysis of the mouth, jaw locks up   Lithobid [Lithium] Other (See Comments)    Seizure-like activity    Roxicodone [Oxycodone] Other (See Comments)    Hallucinations    Seroquel [Quetiapine] Other (See Comments)    Severe drowsiness - Pt currently taking 12.5 mg BID, she is ok with this dose   Shellfish Allergy Anaphylaxis   Phenergan [Promethazine Hcl] Other (See Comments)    Chest pain     Prilosec [Omeprazole] Nausea And Vomiting and Other (See Comments)    Pt can take protonix with no problems    Sulfa Antibiotics Other (See Comments)    Chest pain    Tegretol [Carbamazepine] Nausea And Vomiting   Prozac [Fluoxetine] Other (See Comments)    Increased Depression and Suicidal thoughts   Tape Other (See Comments)    Skin tears, can only tolerate paper tape.   Tylenol [Acetaminophen] Rash and Other (See Comments)    Rash on face

## 2022-09-28 NOTE — Plan of Care (Signed)
  Problem: Clinical Measurements: Goal: Will remain free from infection Outcome: Progressing   Problem: Elimination: Goal: Will not experience complications related to bowel motility Outcome: Progressing

## 2022-09-28 NOTE — Plan of Care (Signed)
  Problem: Education: Goal: Knowledge of General Education information will improve Description: Including pain rating scale, medication(s)/side effects and non-pharmacologic comfort measures Outcome: Progressing   Problem: Health Behavior/Discharge Planning: Goal: Ability to manage health-related needs will improve Outcome: Progressing   Problem: Clinical Measurements: Goal: Ability to maintain clinical measurements within normal limits will improve Outcome: Progressing Goal: Will remain free from infection Outcome: Progressing Goal: Diagnostic test results will improve Outcome: Progressing Goal: Cardiovascular complication will be avoided Outcome: Progressing   Problem: Activity: Goal: Risk for activity intolerance will decrease Outcome: Progressing   Problem: Coping: Goal: Level of anxiety will decrease Outcome: Progressing   Problem: Elimination: Goal: Will not experience complications related to bowel motility Outcome: Progressing   Problem: Pain Managment: Goal: General experience of comfort will improve Outcome: Progressing   Problem: Safety: Goal: Ability to remain free from injury will improve Outcome: Progressing   Problem: Skin Integrity: Goal: Risk for impaired skin integrity will decrease Outcome: Progressing   Problem: Role Relationship: Goal: Method of communication will improve Outcome: Progressing   Problem: Coping: Goal: Ability to adjust to condition or change in health will improve Outcome: Progressing   Problem: Fluid Volume: Goal: Ability to maintain a balanced intake and output will improve Outcome: Progressing   Problem: Health Behavior/Discharge Planning: Goal: Ability to identify and utilize available resources and services will improve Outcome: Progressing Goal: Ability to manage health-related needs will improve Outcome: Progressing   Problem: Metabolic: Goal: Ability to maintain appropriate glucose levels will  improve Outcome: Progressing   Problem: Nutritional: Goal: Maintenance of adequate nutrition will improve Outcome: Progressing Goal: Progress toward achieving an optimal weight will improve Outcome: Progressing   Problem: Skin Integrity: Goal: Risk for impaired skin integrity will decrease Outcome: Progressing   Problem: Tissue Perfusion: Goal: Adequacy of tissue perfusion will improve Outcome: Progressing

## 2022-09-28 NOTE — Progress Notes (Signed)
PROGRESS NOTE    [place]  ZOX:[id number] DOB: Aug 09, 1990 DOA: 08/22/2022 PCP: [facility]    Brief Narrative:  This 32 yrs old female with PMH significant of adjustment disorder with mixed anxiety and depressed mood, autism, bipolar 1 disorder, fragile x syndrome, seizures not on meds, schizoaffective disorder and multiple suicide attempts in the last year presented to the ED after ingesting numerous medications in a suicide attempt. She reported taking #30 metoprolol 25mg  tablets, #60 Hydroxyzine 25mg , #7 Lexapro 20mg , #21 Gabapentin 600mg , #7 Flexeril 10mg , #7 protonix 40mg  and #7 Prazosin 1mg . She was slightly bradycardic and hypotensive on EMS arrival which improved with fluids. Poison control was contacted and as she was within 2hrs of ingestion was given activated charcoal. Patient was placed under IVC in ED. Poison control also recommended ICU admission for close Qtc and electrolyte monitoring and possible pressors. She was intubated and started on vasopressor and glucagon. Also started on ceftriaxone for possible pneumonia. Patient was extubated on 7/18 and transferred to the floor on 7/19. She was transferred to [facility] service on 08/25/22. Psychiatry continues to follow. They recommend inpatient psychiatric hospitalization. Awaiting placement   Assessment & Plan:   Principal Problem:   Bipolar I disorder, current or most recent episode depressed, with psychotic features (HCC) Active Problems:   Asthma   GAD (generalized anxiety disorder)   Suicide attempt (HCC)   Overdose   Drug overdose    Suicidal Attempt with intentional overdose:  She has taken multiple medications including # 30 metoprolol 25mg  tablets, # 60 Hydroxyzine 25mg , # 7 Lexapro 20mg , # 21 Gabapentin 600mg , # 7 Flexeril 10mg , # 7 protonix 40mg  and # 7 Prazosin 1mg .  Poison control was contacted.   Received charcoal in the ED.  Initially required vasopressor,  glucagon,  intubation and mechanical  ventilation.  Currently on IVC. Psychiatry placed APS referral as well.  Further care as per psychiatry.     Anxiety and Depression, Schizoaffective Disorder, Adjustment Disorder, bipolar disorder, fibromyalgia and auditory hallucination:  Meds per psych. Continue one-to-one sitter at bedside as she continues to harm herself   Community-acquired pneumonia: Completed antibiotic treatment for community-acquired pneumonia.   HTN: -resumed Lopressor.   Sinus Tachycardia: Continue Lopressor.   -d dimer normal -orthostatics not done despite orders -troponin normal   Metabolic Acidosis: -resolved   Hypomagnesemia: -repleted   Microcytic Anemia: Iron deficiency anemia.  Low ferritin.  Continue oral iron.   History of Seizure Disorder: -Stable.  Not on meds.   History of Asthma: Continue nebulizers.  Stable.   Dysuria: She was  catheterized during ICU.   - recently treated with antibiotics.   Hypoalbuminemia: Encourage oral intake.   Obesity: Body mass index is 36.03 kg/m.  Would benefit from weight loss as outpatient.   DVT prophylaxis: SCDs Code Status: Full code Family Communication:No family at bed side. Disposition Plan: per psych and TOC   Status is: Inpatient Remains inpatient appropriate because: Admitted for suicidal ideations,  evaluated by psychiatry. Patient is awaiting psych inpatient hospitalization.   Consultants:  Psychiatry    Subjective: sleeping  Objective: Vitals:   09/27/22 0332 09/27/22 0806 09/28/22 0818 09/28/22 0905  BP: 112/78   128/70  Pulse: 96   (!) 104  Resp: 13   16  Temp: 98 F (36.7 C)   98.3 F (36.8 C)  TempSrc: Oral   Oral  SpO2:  96% 98%   Weight:      Height:  No intake or output data in the 24 hours ending 09/28/22 1331  Filed Weights   09/01/22 0300 09/02/22 0500 09/03/22 0402  Weight: 98.8 kg 99 kg 98.2 kg    Examination: sleeping    Data Reviewed: I have personally reviewed following labs and  imaging studies  CBC: Recent Labs  Lab 09/25/22 0748  WBC 8.4  HGB 11.8*  HCT 38.2  MCV 80.1  PLT 263   Basic Metabolic Panel: Recent Labs  Lab 09/25/22 0748  NA 137  K 3.6  CL 103  CO2 22  GLUCOSE 94  BUN 11  CREATININE 0.67  CALCIUM 8.9  MG 1.7  PHOS 4.2   GFR: Estimated Creatinine Clearance: 116.1 mL/min (by C-G formula based on SCr of 0.67 mg/dL). Liver Function Tests: Recent Labs  Lab 09/25/22 0748  AST 16  ALT 19  ALKPHOS 68  BILITOT 0.3  PROT 6.9  ALBUMIN 3.6   No results for input(s): "LIPASE", "AMYLASE" in the last 168 hours. No results for input(s): "AMMONIA" in the last 168 hours. Coagulation Profile: No results for input(s): "INR", "PROTIME" in the last 168 hours. Cardiac Enzymes: No results for input(s): "CKTOTAL", "CKMB", "CKMBINDEX", "TROPONINI" in the last 168 hours. BNP (last 3 results) No results for input(s): "PROBNP" in the last 8760 hours. HbA1C: No results for input(s): "HGBA1C" in the last 72 hours. CBG: No results for input(s): "GLUCAP" in the last 168 hours. Lipid Profile: No results for input(s): "CHOL", "HDL", "LDLCALC", "TRIG", "CHOLHDL", "LDLDIRECT" in the last 72 hours. Thyroid Function Tests: No results for input(s): "TSH", "T4TOTAL", "FREET4", "T3FREE", "THYROIDAB" in the last 72 hours. Anemia Panel: No results for input(s): "VITAMINB12", "FOLATE", "FERRITIN", "TIBC", "IRON", "RETICCTPCT" in the last 72 hours. Sepsis Labs: No results for input(s): "PROCALCITON", "LATICACIDVEN" in the last 168 hours.  No results found for this or any previous visit (from the past 240 hour(s)).   Radiology Studies: No results found.  Scheduled Meds:  cholecalciferol  400 Units Oral Daily   cyclobenzaprine  10 mg Oral QHS   diclofenac Sodium  2 g Topical QID   DULoxetine  60 mg Oral Daily   ferrous sulfate  325 mg Oral BID WC   gabapentin  600 mg Oral TID   metoprolol tartrate  12.5 mg Oral BID   mometasone-formoterol  2 puff  Inhalation BID   mupirocin cream   Topical Daily   OLANZapine  15 mg Oral Daily   OLANZapine zydis  5 mg Oral BID AC   pantoprazole  40 mg Oral QAC breakfast   prazosin  1 mg Oral QHS   sodium bicarbonate  650 mg Oral TID   Continuous Infusions:  sodium chloride Stopped (08/24/22 1347)     LOS: 37 days    Time spent: 35 mins    Joseph Art, DO [facility]   If 7PM-7AM, please contact night-coverage

## 2022-09-29 DIAGNOSIS — F315 Bipolar disorder, current episode depressed, severe, with psychotic features: Secondary | ICD-10-CM | POA: Diagnosis not present

## 2022-09-29 NOTE — Consult Note (Signed)
[name] Psychiatry Consult Evaluation  Service Date: September 29, 2022 LOS:  LOS: 38 days    Primary Psychiatric Diagnoses   Schizoaffective disorder, bipolar type  GAD  Borderline personality disorder  Assessment  [name] is a 32 y.o. female with a past psychiatric history of borderline personality disorder, schizoaffective disorder bipolar type, and PTSD.  She has had numerous behavioral health admissions and an extensive history of suicide attempts.  Patient presents to [facility] ED for an attempted overdose on her prescription medications.  Briefly, this pt has had ~50 ED visits or psych hospitalizations during 2024; this continues a pattern of an inability to maintain safety in the outpatient setting going back multiple years. The vast majority of her suicide attempts have been via overdose on psychotropic medication. She has failed multiple levels of outpatient management up to and including CST; she has been declined by multiple psychiatric facilities due to futility, and she has been discharged by her CST team as of 8/8. She is currently on the [facility] priority wait list. She is not eligible for ACT services through [facility] due to BPD diagnosis.   We are trying to simplify patient's medication regimen to avoid polypharmacy especially in the setting of her recent overdose. Earlier in hospitalization during a period of improvement (roughly 8/1 to 8/8), we discussed medication management if and when she does return home including closer monitoring, less access to her medications through a locked cabinet, and her ex-boyfriend being more in charge of her medications in which was confirmed with her ex-boyfriend before being dropped by her CST.  After notification of discontinuation from CST services and ineligibility for ACT services, patient had acute worsening of reported hallucinations, suicidality, homicidality and agitation (fear of abandonment is central feature in BPD) and  consideration of community f/u was terminated. Patient has an extensive history of ER visits and hospitalizations in which she has displayed aggression, psychosis, and danger to self even while in proximity of a Recruitment consultant. Patient also shows high level of lethality, intensity, and frequency of suicidal attempts with multiple inpatient psychiatric admissions with little benefit so we continue to recommend [facility]; given wait list will also refer to other psychiatric hospitals. Patient is currently on the [facility] priority wait list and we are working closely with LCSW with bed availability; exploring other state hospital options.   8/22:Minimal change overnight, see pt interview. Declined again coloring books, DBT worksheets, declined metformin.   Diagnoses:  Active Hospital problems: Principal Problem:   Bipolar I disorder, current or most recent episode depressed, with psychotic features (HCC) Active Problems:   Asthma   GAD (generalized anxiety disorder)   Suicide attempt (HCC)   Overdose   Drug overdose    Plan   ## Psychiatric Medication Recommendations:  -- Continue Zyprexa to 5 mg po before lunch and supper for mood stabilization during the day (last changed 8/23) -- Continue Zyprexa 15 mg po nightly for psychosis  -- Continue Cymbalta 60 mg daily -- Continue Gabapentin 600 mg TID for agitation, mood/anxiety -- continue prazosin 1 mg at bedtime for PTSD  Agitation Protocol -- PRN Zyprexa 10 mg IM for agitation -- PRN Ativan 0.5 mg oral BID PRN for agitation -- PRN Atarax 25 mg TID for anxiety, agitation  ## Medical Decision Making Capacity:   Capacity was not formally addressed during this encounter  ## Further Work-up:  -- Per primary -- most recent EKG on 08/30/2022 had QtC of 469  ## Disposition:  --  We recommend inpatient psychiatric hospitalization after medical hospitalization. Patient has been involuntarily committed on 09/04/2022.  -- IVC renewed on 8/21 -- Patient  remains on [facility] priority wait list  ## Behavioral / Environmental:  --  To minimize splitting of staff, assign one staff person to communicate all information from the team when feasible. -- Apply the established behavioral rules and protocols consistently throughout hospitalization (see nursing care order for details). Ensure that all staff members are aware of and adhere to the same guidelines to avoid confusion and mixed messages.  ## Safety and Observation Level:  - Based on my clinical evaluation, I estimate the patient to be at elevated risk of self harm in the current setting - At this time, we recommend continuing a 1:1 level of observation. This decision is based on my review of the chart including patient's history and current presentation, interview of the patient, mental status examination, and consideration of suicide risk including evaluating suicidal ideation, plan, intent, suicidal or self-harm behaviors, risk factors, and protective factors. This judgment is based on our ability to directly address suicide risk, implement suicide prevention strategies and develop a safety plan while the patient is in the clinical setting. Please contact our team if there is a concern that risk level has changed.  Suicide risk assessment  Patient has following modifiable risk factors for suicide: active suicidal ideation, under treated depression , recklessness, and lack of access to outpatient mental health resources, which we are addressing by continuing adequate safety precautions while she is hospitalized and adjusting medications   Patient has following non-modifiable or demographic risk factors for suicide: early widowhood, history of suicide attempt, history of self harm behavior, and psychiatric hospitalization  Patient has the following protective factors against suicide: Supportive friends  Thank you for this consult request. Recommendations have been communicated to the primary team.  We  will follow at this time.    Nicholad Kautzman A Ameer Sanden   Total Time Spent in Direct Patient Care:  I personally spent 25 minutes on the unit in direct patient care. The direct patient care time included face-to-face time with the patient, reviewing the patient's chart, communicating with other professionals, and coordinating care. Greater than 50% of this time was spent in counseling or coordinating care with the patient regarding goals of hospitalization, psycho-education, and discharge planning needs.    Psychiatric and Social History   Relevant Aspects of Hospital Course:  Admitted on 08/22/2022 for suicide attempt by overdose on her prescription medication.   Patient Report:  Patient seen this AM. Today feels "OK". Unable to name anything that makes today better or worse than any other day. We talked about her suicidality; she regrets the suicide attempt that led to hospitalization and feels it is unfair that this suicide attempt led to prolonged hospitalization while she was treated with a short admission for the others (notably she has been rejected by every short term psych hospital d/t maximizing therapeutic benefit, dropped by CST). She has been meeting her walking goals, but declined offered coloring book and DBT worksheets. Declined metformin after dicussion of r/b/se. No issues with vit D. No SI, AH/VH, chronic HI towards family. Denied current plans to kill self in the hospital. Discussed suicide attempts she feels are preventable and those she feels she has no control over.   Collateral 8/14: called James, no response 8/12: tried to call Fayrene Fearing with no response.  8/9: called Debra with CST - stated she would be happy to conduct a termination  session with [name] if she reached out . She has not been called by [name] during this hospitalization and Kelbie has refused offered termination session.  8/9: called Dr. Pershing Proud with [facility] to discuss pt's recent decompensation  8/5: spoke with  Fayrene Fearing  [employer] in to note above   Past Psychiatric History: Previous Psych Diagnoses: Schizoaffective disorder bipolar type, Anxiety, Autism spectrum disorder, PTSD, Borderline personality disorder Prior inpatient treatment: multiple, 15 ED visits and 4 inpatient admissions since 06/2022 (~50 total in 2024), admitted to [facility] from 07/29/22-08/02/2022, has been to [facility] for eight months in 2016 which led to period of stability, and was discharged from inpatient facility within a week of her OD Current/prior outpatient treatment: [facility], CST team twice a week Prior rehab hx: denies multiple Psychotherapy hx: [facility] History of suicide: 20-30 times by overdosing on pills, cutting or drinking 409 disinfectant History of homicide or aggression: denies Psychiatric medication history:  Previously trials of Tegretol, Haldol, Clozaril, Depakote, Invega, Lamictal, Seroquel, Asenapine, Abilify, vraylar (worsened suicidal thoughts), Lexapro, Zoloft  Neuromodulation history: denies  Social History:  Marital status: Widowed Children: 80 year old daughter Lives: with ex-boyfriend Fayrene Fearing, in two story home Employment: Disability, 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  Access to weapons: None   Exam Findings   Psychiatric Specialty Exam:  Presentation  General Appearance: -- (lying in bed)  Eye Contact:Poor  Speech:Clear and Coherent  Speech Volume:Normal  Handedness:Right   Mood and Affect  Mood:-- "Fine"  Affect:-- Congruent    Thought Process  Thought Processes:Coherent  Descriptions of Associations:Intact  Orientation:Full (Time, Place and Person)  Thought Content:Logical  History of Schizophrenia/Schizoaffective disorder: Yes  Duration of Psychotic Symptoms:Greater than six months  Hallucinations: Denies  Ideas of Reference:None  Suicidal Thoughts: Denies  Homicidal Thoughts:  Denies   Sensorium  Memory:Immediate Good; Recent Good; Remote Good  Judgment:Poor  Insight: Lacking   Executive Functions  Concentration:Fair  Attention Span:Fair  Recall:Fair  Fund of Knowledge:Fair  Language:Fair   Psychomotor Activity  Psychomotor Activity: Normal  Assets  Assets:Communication Skills   Sleep  Sleep: Fair    Physical Exam: Vital signs:  Temp:  [97.6 F (36.4 C)-98.9 F (37.2 C)] 98.9 F (37.2 C) (08/24 0500) Pulse Rate:  [97-104] 97 (08/24 0855) Resp:  [17-18] 17 (08/24 0846) BP: (120-129)/(69-73) 120/73 (08/24 0855) SpO2:  [96 %-98 %] 96 % (08/24 0846) Physical Exam Vitals and nursing note reviewed. Exam conducted with a chaperone present.  Constitutional:      General: She is not in acute distress.    Appearance: She is obese. She is not ill-appearing.  HENT:     Head: Normocephalic.  Cardiovascular:     Rate and Rhythm: Normal rate.  Pulmonary:     Effort: Pulmonary effort is normal. No respiratory distress.  Neurological:     General: No focal deficit present.     Mental Status: She is alert.  Psychiatric:        Attention and Perception: Attention normal.        Speech: Speech normal.     Blood pressure 120/73, pulse 97, temperature 98.9 F (37.2 C), temperature source Oral, resp. rate 17, height 5\' 5"  (1.651 m), weight 98.2 kg, last menstrual period 08/08/2022, SpO2 96%. Body mass index is 36.03 kg/m.   Other History   These have been pulled in through the EMR, reviewed, and updated if appropriate.   Family History:  The patient's family history includes Asthma in her father; Mental illness in her father; PDD in her brother; Seizures in her brother.  Medical History: Past Medical History:  Diagnosis Date   Acid reflux    Adjustment disorder with mixed anxiety and depressed mood 01/31/2022   Adjustment disorder with mixed disturbance of emotions and conduct 08/03/2019   Anxiety    Asthma    last attack 03/13/15  or 03/14/15   Autism    Bipolar 1 disorder, depressed, severe (HCC) 07/25/2021   Carrier of fragile X syndrome    Chronic constipation    Depression    Drug-seeking behavior    Essential tremor    Headache    Ineffective individual coping 05/16/2022   Insomnia 01/12/2022   Intentional drug overdose (HCC) 06/05/2022   Neuromuscular disorder (HCC)    Normocytic anemia 06/05/2022   Overdose 07/22/2017   Overdose of acetaminophen 07/2017   and other meds   Overdose, intentional self-harm, initial encounter (HCC) 07/20/2021   Paranoia (HCC) 04/22/2021   Personality disorder (HCC)    Purposeful non-suicidal drug ingestion (HCC) 06/27/2021   Schizo-affective psychosis (HCC)    Schizoaffective disorder (HCC) 07/29/2022   Schizoaffective disorder, bipolar type (HCC)    Seizures (HCC)    Last seizure December 2017   Skin erythema 04/27/2022   Sleep apnea    Suicidal behavior 07/25/2021   Suicidal ideation    Suicide (HCC) 07/01/2021   Suicide attempt (HCC) 07/04/2021    Surgical History: Past Surgical History:  Procedure Laterality Date   MOUTH SURGERY  2009 or 2010    Medications:   Current Facility-Administered Medications:    0.9 %  sodium chloride infusion, 250 mL, Intravenous, Continuous, Gonfa, Taye T, MD, Stopped at 08/24/22 1347   albuterol (PROVENTIL) (2.5 MG/3ML) 0.083% nebulizer solution 3 mL, 3 mL, Inhalation, Q6H PRN, Gonfa, Taye T, MD   cholecalciferol (VITAMIN D3) 10 MCG (400 UNIT) tablet 400 Units, 400 Units, Oral, Daily, Cina Klumpp A, 400 Units at 09/29/22 0855   cyclobenzaprine (FLEXERIL) tablet 10 mg, 10 mg, Oral, QHS, Gonfa, Taye T, MD, 10 mg at 09/28/22 2037   diclofenac Sodium (VOLTAREN) 1 % topical gel 2 g, 2 g, Topical, QID, Gonfa, Taye T, MD, 2 g at 09/29/22 0855   DULoxetine (CYMBALTA) DR capsule 60 mg, 60 mg, Oral, Daily, Akintayo, Mojeed, MD, 60 mg at 09/29/22 0855   ferrous sulfate tablet 325 mg, 325 mg, Oral, BID WC, Gonfa, Taye T, MD, 325  mg at 09/29/22 0854   gabapentin (NEURONTIN) capsule 600 mg, 600 mg, Oral, TID, Alanda Slim, Taye T, MD, 600 mg at 09/29/22 0854   hydrOXYzine (ATARAX) tablet 25 mg, 25 mg, Oral, TID PRN, Alanda Slim, Taye T, MD, 25 mg at 09/29/22 0855   ibuprofen (ADVIL) tablet 600 mg, 600 mg, Oral, TID PRN, Alanda Slim, Taye T, MD, 600 mg at 09/27/22 1506   LORazepam (ATIVAN) tablet 0.5 mg, 0.5 mg, Oral, BID PRN, Alanda Slim, Taye T, MD, 0.5 mg at 09/28/22 2038   metoprolol tartrate (LOPRESSOR) tablet 12.5 mg, 12.5 mg, Oral, BID, Vann, Jessica U, DO, 12.5 mg at 09/29/22 0855   mometasone-formoterol (DULERA) 200-5 MCG/ACT inhaler 2 puff, 2 puff, Inhalation, BID, Alanda Slim, Taye T, MD, 2 puff at 09/29/22 0846   mupirocin cream (BACTROBAN) 2 %, , Topical, Daily, Marguerita Merles [place], DO, Given at 09/27/22 0859   OLANZapine (ZYPREXA) injection 10 mg, 10 mg, Intramuscular, Once PRN, Akintayo, Mojeed, MD   OLANZapine (ZYPREXA) tablet 15 mg, 15 mg,  Oral, Daily, Barbar Brede A, 15 mg at 09/28/22 2039   OLANZapine zydis (ZYPREXA) disintegrating tablet 5 mg, 5 mg, Oral, BID AC, Mariel Craft, MD, 5 mg at 09/28/22 1651   ondansetron (ZOFRAN-ODT) disintegrating tablet 4 mg, 4 mg, Oral, Q8H PRN, Sheikh, Omair Latif, DO, 4 mg at 09/17/22 1700   Oral care mouth rinse, 15 mL, Mouth Rinse, PRN, Osvaldo Shipper, MD   pantoprazole (PROTONIX) EC tablet 40 mg, 40 mg, Oral, QAC breakfast, Gonfa, Taye T, MD, 40 mg at 09/29/22 0854   polyethylene glycol (MIRALAX / GLYCOLAX) packet 17 g, 17 g, Oral, BID PRN, Alanda Slim, Taye T, MD, 17 g at 09/27/22 2253   prazosin (MINIPRESS) capsule 1 mg, 1 mg, Oral, QHS, Gonfa, Taye T, MD, 1 mg at 09/28/22 2037   senna-docusate (Senokot-S) tablet 1 tablet, 1 tablet, Oral, BID PRN, Candelaria Stagers T, MD, 1 tablet at 09/27/22 2253   sodium bicarbonate tablet 650 mg, 650 mg, Oral, TID, Candelaria Stagers T, MD, 650 mg at 09/29/22 0854  Allergies: Allergies  Allergen Reactions   Bee Venom Anaphylaxis   Coconut Flavor Anaphylaxis and  Rash   Fish Allergy Anaphylaxis   Geodon [Ziprasidone Hcl] Other (See Comments)    Pt states that this medication causes paralysis of the mouth.     Haloperidol And Related Other (See Comments)    Pt states that this medication causes paralysis of the mouth, jaw locks up   Lithobid [Lithium] Other (See Comments)    Seizure-like activity    Roxicodone [Oxycodone] Other (See Comments)    Hallucinations    Seroquel [Quetiapine] Other (See Comments)    Severe drowsiness - Pt currently taking 12.5 mg BID, she is ok with this dose   Shellfish Allergy Anaphylaxis   Phenergan [Promethazine Hcl] Other (See Comments)    Chest pain     Prilosec [Omeprazole] Nausea And Vomiting and Other (See Comments)    Pt can take protonix with no problems    Sulfa Antibiotics Other (See Comments)    Chest pain    Tegretol [Carbamazepine] Nausea And Vomiting   Prozac [Fluoxetine] Other (See Comments)    Increased Depression and Suicidal thoughts   Tape Other (See Comments)    Skin tears, can only tolerate paper tape.   Tylenol [Acetaminophen] Rash and Other (See Comments)    Rash on face

## 2022-09-29 NOTE — Plan of Care (Signed)
?  Problem: Education: ?Goal: Knowledge of General Education information will improve ?Description: Including pain rating scale, medication(s)/side effects and non-pharmacologic comfort measures ?Outcome: Progressing ?  ?Problem: Health Behavior/Discharge Planning: ?Goal: Ability to manage health-related needs will improve ?Outcome: Progressing ?  ?Problem: Coping: ?Goal: Level of anxiety will decrease ?Outcome: Progressing ?  ?

## 2022-09-29 NOTE — Progress Notes (Signed)
PROGRESS NOTE    [name]  HQI:[id number] DOB: 08/05/1990 DOA: 08/22/2022 PCP: [facility]    Brief Narrative:  This 32 yrs old female with PMH significant of adjustment disorder with mixed anxiety and depressed mood, autism, bipolar 1 disorder, fragile x syndrome, seizures not on meds, schizoaffective disorder and multiple suicide attempts in the last year presented to the ED after ingesting numerous medications in a suicide attempt. She reported taking #30 metoprolol 25mg  tablets, #60 Hydroxyzine 25mg , #7 Lexapro 20mg , #21 Gabapentin 600mg , #7 Flexeril 10mg , #7 protonix 40mg  and #7 Prazosin 1mg . She was slightly bradycardic and hypotensive on EMS arrival which improved with fluids. Poison control was contacted and as she was within 2hrs of ingestion was given activated charcoal. Patient was placed under IVC in ED. Poison control also recommended ICU admission for close Qtc and electrolyte monitoring and possible pressors. She was intubated and started on vasopressor and glucagon. Also started on ceftriaxone for possible pneumonia. Patient was extubated on 7/18 and transferred to the floor on 7/19. She was transferred to [facility] service on 08/25/22. Psychiatry continues to follow. They recommend inpatient psychiatric hospitalization. Awaiting placement   Assessment & Plan:   Principal Problem:   Bipolar I disorder, current or most recent episode depressed, with psychotic features (HCC) Active Problems:   Asthma   GAD (generalized anxiety disorder)   Suicide attempt (HCC)   Overdose   Drug overdose    Suicidal Attempt with intentional overdose:  She has taken multiple medications including # 30 metoprolol 25mg  tablets, # 60 Hydroxyzine 25mg , # 7 Lexapro 20mg , # 21 Gabapentin 600mg , # 7 Flexeril 10mg , # 7 protonix 40mg  and # 7 Prazosin 1mg .  Poison control was contacted.   Received charcoal in the ED.  Initially required vasopressor,  glucagon,  intubation and mechanical  ventilation.  Currently on IVC. Psychiatry placed APS referral as well.  Further care as per psychiatry.     Anxiety and Depression, Schizoaffective Disorder, Adjustment Disorder, bipolar disorder, fibromyalgia and auditory hallucination:  Meds per psych. Continue one-to-one sitter at bedside as she continues to harm herself   Community-acquired pneumonia: Completed antibiotic treatment for community-acquired pneumonia.   HTN: -resumed Lopressor.   Sinus Tachycardia: Continue Lopressor.   -d dimer normal -orthostatics not done despite orders -troponin normal   Metabolic Acidosis: -resolved   Hypomagnesemia: -repleted   Microcytic Anemia: Iron deficiency anemia.  Low ferritin.  Continue oral iron.   History of Seizure Disorder: -Stable.  Not on meds.   History of Asthma: Continue nebulizers.  Stable.   Dysuria: She was  catheterized during ICU.   - recently treated with antibiotics.   Hypoalbuminemia: Encourage oral intake.   Obesity: Body mass index is 36.03 kg/m.  Would benefit from weight loss as outpatient.   DVT prophylaxis: SCDs Code Status: Full code Family Communication:No family at bed side. Disposition Plan: per psych and TOC   Status is: Inpatient Remains inpatient appropriate because: Admitted for suicidal ideations,  evaluated by psychiatry. Patient is awaiting psych inpatient hospitalization.   Consultants:  Psychiatry    Subjective: Continues to intermittently hard herself  Objective: Vitals:   09/28/22 2049 09/29/22 0500 09/29/22 0846 09/29/22 0855  BP: 124/69 129/70  120/73  Pulse: (!) 104 100 100 97  Resp: 18 17 17    Temp: 97.6 F (36.4 C) 98.9 F (37.2 C)    TempSrc: Oral Oral    SpO2: 98% 96% 96%   Weight:      Height:  Intake/Output Summary (Last 24 hours) at 09/29/2022 1233 Last data filed at 09/28/2022 2200 Gross per 24 hour  Intake 480 ml  Output --  Net 480 ml    Filed Weights   09/01/22 0300 09/02/22  0500 09/03/22 0402  Weight: 98.8 kg 99 kg 98.2 kg    Examination: Appears comfortable, laying in bed    Data Reviewed: I have personally reviewed following labs and imaging studies  CBC: Recent Labs  Lab 09/25/22 0748  WBC 8.4  HGB 11.8*  HCT 38.2  MCV 80.1  PLT 263   Basic Metabolic Panel: Recent Labs  Lab 09/25/22 0748  NA 137  K 3.6  CL 103  CO2 22  GLUCOSE 94  BUN 11  CREATININE 0.67  CALCIUM 8.9  MG 1.7  PHOS 4.2   GFR: Estimated Creatinine Clearance: 116.1 mL/min (by C-G formula based on SCr of 0.67 mg/dL). Liver Function Tests: Recent Labs  Lab 09/25/22 0748  AST 16  ALT 19  ALKPHOS 68  BILITOT 0.3  PROT 6.9  ALBUMIN 3.6   No results for input(s): "LIPASE", "AMYLASE" in the last 168 hours. No results for input(s): "AMMONIA" in the last 168 hours. Coagulation Profile: No results for input(s): "INR", "PROTIME" in the last 168 hours. Cardiac Enzymes: No results for input(s): "CKTOTAL", "CKMB", "CKMBINDEX", "TROPONINI" in the last 168 hours. BNP (last 3 results) No results for input(s): "PROBNP" in the last 8760 hours. HbA1C: No results for input(s): "HGBA1C" in the last 72 hours. CBG: No results for input(s): "GLUCAP" in the last 168 hours. Lipid Profile: No results for input(s): "CHOL", "HDL", "LDLCALC", "TRIG", "CHOLHDL", "LDLDIRECT" in the last 72 hours. Thyroid Function Tests: No results for input(s): "TSH", "T4TOTAL", "FREET4", "T3FREE", "THYROIDAB" in the last 72 hours. Anemia Panel: No results for input(s): "VITAMINB12", "FOLATE", "FERRITIN", "TIBC", "IRON", "RETICCTPCT" in the last 72 hours. Sepsis Labs: No results for input(s): "PROCALCITON", "LATICACIDVEN" in the last 168 hours.  No results found for this or any previous visit (from the past 240 hour(s)).   Radiology Studies: No results found.  Scheduled Meds:  cholecalciferol  400 Units Oral Daily   cyclobenzaprine  10 mg Oral QHS   diclofenac Sodium  2 g Topical QID    DULoxetine  60 mg Oral Daily   ferrous sulfate  325 mg Oral BID WC   gabapentin  600 mg Oral TID   metoprolol tartrate  12.5 mg Oral BID   mometasone-formoterol  2 puff Inhalation BID   mupirocin cream   Topical Daily   OLANZapine  15 mg Oral Daily   OLANZapine zydis  5 mg Oral BID AC   pantoprazole  40 mg Oral QAC breakfast   prazosin  1 mg Oral QHS   sodium bicarbonate  650 mg Oral TID   Continuous Infusions:  sodium chloride Stopped (08/24/22 1347)     LOS: 38 days    Time spent: 25 mins    Joseph Art, DO [facility]   If 7PM-7AM, please contact night-coverage

## 2022-09-29 NOTE — Plan of Care (Signed)
  Problem: Education: Goal: Knowledge of General Education information will improve Description: Including pain rating scale, medication(s)/side effects and non-pharmacologic comfort measures Outcome: Progressing   Problem: Health Behavior/Discharge Planning: Goal: Ability to manage health-related needs will improve Outcome: Progressing   Problem: Clinical Measurements: Goal: Ability to maintain clinical measurements within normal limits will improve Outcome: Progressing Goal: Will remain free from infection Outcome: Progressing Goal: Diagnostic test results will improve Outcome: Progressing Goal: Cardiovascular complication will be avoided Outcome: Progressing   Problem: Activity: Goal: Risk for activity intolerance will decrease Outcome: Progressing   Problem: Coping: Goal: Level of anxiety will decrease Outcome: Progressing   Problem: Elimination: Goal: Will not experience complications related to bowel motility Outcome: Progressing   Problem: Pain Managment: Goal: General experience of comfort will improve Outcome: Progressing   Problem: Safety: Goal: Ability to remain free from injury will improve Outcome: Progressing   Problem: Skin Integrity: Goal: Risk for impaired skin integrity will decrease Outcome: Progressing   Problem: Role Relationship: Goal: Method of communication will improve Outcome: Progressing   Problem: Coping: Goal: Ability to adjust to condition or change in health will improve Outcome: Progressing   Problem: Fluid Volume: Goal: Ability to maintain a balanced intake and output will improve Outcome: Progressing   Problem: Health Behavior/Discharge Planning: Goal: Ability to identify and utilize available resources and services will improve Outcome: Progressing Goal: Ability to manage health-related needs will improve Outcome: Progressing   Problem: Metabolic: Goal: Ability to maintain appropriate glucose levels will  improve Outcome: Progressing   Problem: Nutritional: Goal: Maintenance of adequate nutrition will improve Outcome: Progressing Goal: Progress toward achieving an optimal weight will improve Outcome: Progressing   Problem: Skin Integrity: Goal: Risk for impaired skin integrity will decrease Outcome: Progressing   Problem: Tissue Perfusion: Goal: Adequacy of tissue perfusion will improve Outcome: Progressing

## 2022-09-30 DIAGNOSIS — F315 Bipolar disorder, current episode depressed, severe, with psychotic features: Secondary | ICD-10-CM | POA: Diagnosis not present

## 2022-09-30 NOTE — Plan of Care (Signed)
  Problem: Education: Goal: Knowledge of General Education information will improve Description: Including pain rating scale, medication(s)/side effects and non-pharmacologic comfort measures Outcome: Progressing   Problem: Health Behavior/Discharge Planning: Goal: Ability to manage health-related needs will improve Outcome: Progressing   Problem: Coping: Goal: Ability to adjust to condition or change in health will improve Outcome: Progressing

## 2022-09-30 NOTE — Progress Notes (Signed)
PROGRESS NOTE    [name]  VHQ:[id number] DOB: 1990-05-27 DOA: 08/22/2022 PCP: [facility]    Brief Narrative:  This 32 yrs old female with PMH significant of adjustment disorder with mixed anxiety and depressed mood, autism, bipolar 1 disorder, fragile x syndrome, seizures not on meds, schizoaffective disorder and multiple suicide attempts in the last year presented to the ED after ingesting numerous medications in a suicide attempt. She reported taking #30 metoprolol 25mg  tablets, #60 Hydroxyzine 25mg , #7 Lexapro 20mg , #21 Gabapentin 600mg , #7 Flexeril 10mg , #7 protonix 40mg  and #7 Prazosin 1mg . She was slightly bradycardic and hypotensive on EMS arrival which improved with fluids. Poison control was contacted and as she was within 2hrs of ingestion was given activated charcoal. Patient was placed under IVC in ED. Poison control also recommended ICU admission for close Qtc and electrolyte monitoring and possible pressors. She was intubated and started on vasopressor and glucagon. Also started on ceftriaxone for possible pneumonia. Patient was extubated on 7/18 and transferred to the floor on 7/19. She was transferred to [facility] service on 08/25/22. Psychiatry continues to follow. They recommend inpatient psychiatric hospitalization. Awaiting placement   Assessment & Plan:   Principal Problem:   Bipolar I disorder, current or most recent episode depressed, with psychotic features (HCC) Active Problems:   Asthma   GAD (generalized anxiety disorder)   Suicide attempt (HCC)   Overdose   Drug overdose    Suicidal Attempt with intentional overdose:  She has taken multiple medications including # 30 metoprolol 25mg  tablets, # 60 Hydroxyzine 25mg , # 7 Lexapro 20mg , # 21 Gabapentin 600mg , # 7 Flexeril 10mg , # 7 protonix 40mg  and # 7 Prazosin 1mg .  Poison control was contacted.   Received charcoal in the ED.  Initially required vasopressor,  glucagon,  intubation and mechanical  ventilation.  Currently on IVC. Psychiatry placed APS referral as well.  Further care as per psychiatry.     Anxiety and Depression, Schizoaffective Disorder, Adjustment Disorder, bipolar disorder, fibromyalgia and auditory hallucination:  Meds per psych. Continue one-to-one sitter at bedside as she continues to harm herself   Community-acquired pneumonia: Completed antibiotic treatment for community-acquired pneumonia.   HTN: -resumed Lopressor.   Sinus Tachycardia: Continue Lopressor.   -d dimer normal -troponin normal   Metabolic Acidosis: -resolved   Hypomagnesemia: -repleted   Microcytic Anemia: Iron deficiency anemia.  Low ferritin.  Continue oral iron.   History of Seizure Disorder: -Stable.  Not on meds.   History of Asthma: Continue nebulizers.  Stable.   Dysuria: She was  catheterized during ICU.   - recently treated with antibiotics.   Hypoalbuminemia: Encourage oral intake.   Obesity: Body mass index is 36.03 kg/m.  Would benefit from weight loss as outpatient.   DVT prophylaxis: SCDs Code Status: Full code Family Communication:No family at bed side. Disposition Plan: per psych and TOC   Status is: Inpatient Remains inpatient appropriate because: Admitted for suicidal ideations,  evaluated by psychiatry. Patient is awaiting psych inpatient hospitalization.   Consultants:  Psychiatry    Subjective: sleeping  Objective: Vitals:   09/29/22 0846 09/29/22 0855 09/29/22 2014 09/30/22 0849  BP:  120/73 (!) 109/90 122/69  Pulse: 100 97 (!) 103 (!) 104  Resp: 17  17 16   Temp:   98.4 F (36.9 C) 98.1 F (36.7 C)  TempSrc:   Oral Oral  SpO2: 96%  100% 98%  Weight:      Height:        Intake/Output  Summary (Last 24 hours) at 09/30/2022 1050 Last data filed at 09/30/2022 0730 Gross per 24 hour  Intake 1200 ml  Output --  Net 1200 ml    Filed Weights   09/01/22 0300 09/02/22 0500 09/03/22 0402  Weight: 98.8 kg 99 kg 98.2 kg     Examination: sleeping    Data Reviewed: I have personally reviewed following labs and imaging studies  CBC: Recent Labs  Lab 09/25/22 0748  WBC 8.4  HGB 11.8*  HCT 38.2  MCV 80.1  PLT 263   Basic Metabolic Panel: Recent Labs  Lab 09/25/22 0748  NA 137  K 3.6  CL 103  CO2 22  GLUCOSE 94  BUN 11  CREATININE 0.67  CALCIUM 8.9  MG 1.7  PHOS 4.2   GFR: Estimated Creatinine Clearance: 116.1 mL/min (by C-G formula based on SCr of 0.67 mg/dL). Liver Function Tests: Recent Labs  Lab 09/25/22 0748  AST 16  ALT 19  ALKPHOS 68  BILITOT 0.3  PROT 6.9  ALBUMIN 3.6   No results for input(s): "LIPASE", "AMYLASE" in the last 168 hours. No results for input(s): "AMMONIA" in the last 168 hours. Coagulation Profile: No results for input(s): "INR", "PROTIME" in the last 168 hours. Cardiac Enzymes: No results for input(s): "CKTOTAL", "CKMB", "CKMBINDEX", "TROPONINI" in the last 168 hours. BNP (last 3 results) No results for input(s): "PROBNP" in the last 8760 hours. HbA1C: No results for input(s): "HGBA1C" in the last 72 hours. CBG: No results for input(s): "GLUCAP" in the last 168 hours. Lipid Profile: No results for input(s): "CHOL", "HDL", "LDLCALC", "TRIG", "CHOLHDL", "LDLDIRECT" in the last 72 hours. Thyroid Function Tests: No results for input(s): "TSH", "T4TOTAL", "FREET4", "T3FREE", "THYROIDAB" in the last 72 hours. Anemia Panel: No results for input(s): "VITAMINB12", "FOLATE", "FERRITIN", "TIBC", "IRON", "RETICCTPCT" in the last 72 hours. Sepsis Labs: No results for input(s): "PROCALCITON", "LATICACIDVEN" in the last 168 hours.  No results found for this or any previous visit (from the past 240 hour(s)).   Radiology Studies: No results found.  Scheduled Meds:  cholecalciferol  400 Units Oral Daily   cyclobenzaprine  10 mg Oral QHS   diclofenac Sodium  2 g Topical QID   DULoxetine  60 mg Oral Daily   ferrous sulfate  325 mg Oral BID WC   gabapentin   600 mg Oral TID   metoprolol tartrate  12.5 mg Oral BID   mometasone-formoterol  2 puff Inhalation BID   mupirocin cream   Topical Daily   OLANZapine  15 mg Oral Daily   OLANZapine zydis  5 mg Oral BID AC   pantoprazole  40 mg Oral QAC breakfast   prazosin  1 mg Oral QHS   sodium bicarbonate  650 mg Oral TID   Continuous Infusions:  sodium chloride Stopped (08/24/22 1347)     LOS: 39 days    Time spent: 25 mins    Joseph Art, DO [facility]   If 7PM-7AM, please contact night-coverage

## 2022-09-30 NOTE — Consult Note (Signed)
[name] Psychiatry Consult Evaluation  Service Date: September 30, 2022 LOS:  LOS: 39 days    Primary Psychiatric Diagnoses   Schizoaffective disorder, bipolar type  GAD  Borderline personality disorder  Assessment  [name] is a 32 y.o. female with a past psychiatric history of borderline personality disorder, schizoaffective disorder bipolar type, and PTSD.  She has had numerous behavioral health admissions and an extensive history of suicide attempts.  Patient presents to [facility] ED for an attempted overdose on her prescription medications.  Briefly, this pt has had ~50 ED visits or psych hospitalizations during 2024; this continues a pattern of an inability to maintain safety in the outpatient setting going back multiple years. The vast majority of her suicide attempts have been via overdose on psychotropic medication. She has failed multiple levels of outpatient management up to and including CST; she has been declined by multiple psychiatric facilities due to futility, and she has been discharged by her CST team as of 8/8. She is currently on the [facility] priority wait list. She is not eligible for ACT services through [facility] due to BPD diagnosis.   We are trying to simplify patient's medication regimen to avoid polypharmacy especially in the setting of her recent overdose. Earlier in hospitalization during a period of improvement (roughly 8/1 to 8/8), we discussed medication management if and when she does return home including closer monitoring, less access to her medications through a locked cabinet, and her ex-boyfriend being more in charge of her medications in which was confirmed with her ex-boyfriend before being dropped by her CST.  After notification of discontinuation from CST services and ineligibility for ACT services, patient had acute worsening of reported hallucinations, suicidality, homicidality and agitation (fear of abandonment is central feature in BPD) and  consideration of community f/u was terminated. Patient has an extensive history of ER visits and hospitalizations in which she has displayed aggression, psychosis, and danger to self even while in proximity of a Recruitment consultant. Patient also shows high level of lethality, intensity, and frequency of suicidal attempts with multiple inpatient psychiatric admissions with little benefit so we continue to recommend [facility]; given wait list will also refer to other psychiatric hospitals. Patient is currently on the [facility] priority wait list and we are working closely with LCSW with bed availability; exploring other state hospital options.   8/23: Minimal change overnight, see pt interview. Has had gradual reduction in behaviors and reported increase in mood over last several days.   Diagnoses:  Active Hospital problems: Principal Problem:   Bipolar I disorder, current or most recent episode depressed, with psychotic features (HCC) Active Problems:   Asthma   GAD (generalized anxiety disorder)   Suicide attempt (HCC)   Overdose   Drug overdose    Plan   ## Psychiatric Medication Recommendations:  -- Continue Zyprexa to 5 mg po before lunch and supper for mood stabilization during the day (last changed 8/23) -- Continue Zyprexa 15 mg po nightly for psychosis  -- Continue Cymbalta 60 mg daily -- Continue Gabapentin 600 mg TID for agitation, mood/anxiety -- continue prazosin 1 mg at bedtime for PTSD  Agitation Protocol -- PRN Zyprexa 10 mg IM for agitation -- PRN Ativan 0.5 mg oral BID PRN for agitation -- PRN Atarax 25 mg TID for anxiety, agitation  ## Medical Decision Making Capacity:   Capacity was not formally addressed during this encounter  ## Further Work-up:  -- Per primary -- most recent EKG on 08/30/2022 had  QtC of 469  ## Disposition:  -- We recommend inpatient psychiatric hospitalization after medical hospitalization. Patient has been involuntarily committed on 09/04/2022.  -- IVC  renewed on 8/21 -- Patient remains on [facility] priority wait list  ## Behavioral / Environmental:  --  To minimize splitting of staff, assign one staff person to communicate all information from the team when feasible. -- Apply the established behavioral rules and protocols consistently throughout hospitalization (see nursing care order for details). Ensure that all staff members are aware of and adhere to the same guidelines to avoid confusion and mixed messages.  ## Safety and Observation Level:  - Based on my clinical evaluation, I estimate the patient to be at elevated risk of self harm in the current setting - At this time, we recommend continuing a 1:1 level of observation. This decision is based on my review of the chart including patient's history and current presentation, interview of the patient, mental status examination, and consideration of suicide risk including evaluating suicidal ideation, plan, intent, suicidal or self-harm behaviors, risk factors, and protective factors. This judgment is based on our ability to directly address suicide risk, implement suicide prevention strategies and develop a safety plan while the patient is in the clinical setting. Please contact our team if there is a concern that risk level has changed.  Suicide risk assessment  Patient has following modifiable risk factors for suicide: active suicidal ideation, under treated depression , recklessness, and lack of access to outpatient mental health resources, which we are addressing by continuing adequate safety precautions while she is hospitalized and adjusting medications   Patient has following non-modifiable or demographic risk factors for suicide: early widowhood, history of suicide attempt, history of self harm behavior, and psychiatric hospitalization  Patient has the following protective factors against suicide: Supportive friends  Thank you for this consult request. Recommendations have been  communicated to the primary team.  We will follow at this time.    Jahmeek Shirk A Maysun Meditz   Total Time Spent in Direct Patient Care:  I personally spent 25 minutes on the unit in direct patient care. The direct patient care time included face-to-face time with the patient, reviewing the patient's chart, communicating with other professionals, and coordinating care. Greater than 50% of this time was spent in counseling or coordinating care with the patient regarding goals of hospitalization, psycho-education, and discharge planning needs.    Psychiatric and Social History   Relevant Aspects of Hospital Course:  Admitted on 08/22/2022 for suicide attempt by overdose on her prescription medication.   Patient Report:  Pt seen in AM. Feels "a little better today" but unable to discuss reasons for same. Did 2/3 walks yesterday, attempted to link these facts. She feels more active in the afternoons than the Ams (both today and yesterday in room, in bed, lights off when I arrived). Enjoying TV ant time spent with friends - a female friend brought her pics of her daughter recently. Daughter remains main/only motivation for improvement. Discussed frustration with long wait for appropriate placement. Thinks increase in zyprexa helpful for mood. No SI, HI, AH/VH.   Collateral 8/14: called James, no response 8/12: tried to call Fayrene Fearing with no response.  8/9: called Debra with CST - stated she would be happy to conduct a termination session with Mikelyn if she reached out . She has not been called by [name] during this hospitalization and [facility] has refused offered termination session.  8/9: called Dr. Pershing Proud with [facility] to discuss pt's recent  decompensation  8/5: spoke with Fayrene Fearing  [employer] in to note above   Past Psychiatric History: Previous Psych Diagnoses: Schizoaffective disorder bipolar type, Anxiety, Autism spectrum disorder, PTSD, Borderline personality disorder Prior inpatient  treatment: multiple, 15 ED visits and 4 inpatient admissions since 06/2022 (~50 total in 2024), admitted to [facility] from 07/29/22-08/02/2022, has been to [facility] for eight months in 2016 which led to period of stability, and was discharged from inpatient facility within a week of her OD Current/prior outpatient treatment: [facility], CST team twice a week Prior rehab hx: denies multiple Psychotherapy hx: [facility] History of suicide: 20-30 times by overdosing on pills, cutting or drinking 409 disinfectant History of homicide or aggression: denies Psychiatric medication history:  Previously trials of Tegretol, Haldol, Clozaril, Depakote, Invega, Lamictal, Seroquel, Asenapine, Abilify, vraylar (worsened suicidal thoughts), Lexapro, Zoloft  Neuromodulation history: denies  Social History:  Marital status: Widowed Children: 11 year old daughter Lives: with ex-boyfriend Fayrene Fearing, in two story home Employment: Disability, 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  Access to weapons: None   Exam Findings   Psychiatric Specialty Exam:  Presentation  General Appearance: -- (Lying in bed, lights off)  Eye Contact:Poor  Speech:Clear and Coherent  Speech Volume:Normal  Handedness:Right   Mood and Affect  Mood:-- "Fine"  Affect:-- Congruent    Thought Process  Thought Processes:Coherent  Descriptions of Associations:Intact  Orientation:Full (Time, Place and Person)  Thought Content:Logical  History of Schizophrenia/Schizoaffective disorder: Yes  Duration of Psychotic Symptoms:Greater than six months  Hallucinations: Denies  Ideas of Reference:None  Suicidal Thoughts: Denies  Homicidal Thoughts: Denies   Sensorium  Memory:Immediate Good; Recent Good; Remote Good  Judgment:Poor  Insight: Lacking   Executive Functions  Concentration:Fair  Attention Span:Fair  Recall:Fair  Fund of  Knowledge:Fair  Language:Fair   Psychomotor Activity  Psychomotor Activity: Normal  Assets  Assets:Communication Skills   Sleep  Sleep: Fair    Physical Exam: Vital signs:  Temp:  [98.1 F (36.7 C)-98.4 F (36.9 C)] 98.1 F (36.7 C) (08/25 0849) Pulse Rate:  [103-104] 104 (08/25 0849) Resp:  [16-17] 16 (08/25 0849) BP: (109-122)/(69-90) 122/69 (08/25 0849) SpO2:  [98 %-100 %] 98 % (08/25 0849) Physical Exam Vitals and nursing note reviewed. Exam conducted with a chaperone present.  Constitutional:      General: She is not in acute distress.    Appearance: She is obese. She is not ill-appearing.  HENT:     Head: Normocephalic.  Cardiovascular:     Rate and Rhythm: Normal rate.  Pulmonary:     Effort: Pulmonary effort is normal. No respiratory distress.  Neurological:     General: No focal deficit present.     Mental Status: She is alert.  Psychiatric:        Attention and Perception: Attention normal.        Speech: Speech normal.     Blood pressure 122/69, pulse (!) 104, temperature 98.1 F (36.7 C), temperature source Oral, resp. rate 16, height 5\' 5"  (1.651 m), weight 98.2 kg, last menstrual period 08/08/2022, SpO2 98%. Body mass index is 36.03 kg/m.   Other History   These have been pulled in through the EMR, reviewed, and updated if appropriate.   Family History:  The patient's family history includes Asthma in her father; Mental illness in her father; PDD in her brother; Seizures in her brother.  Medical History: Past Medical History:  Diagnosis Date  Acid reflux    Adjustment disorder with mixed anxiety and depressed mood 01/31/2022   Adjustment disorder with mixed disturbance of emotions and conduct 08/03/2019   Anxiety    Asthma    last attack 03/13/15 or 03/14/15   Autism    Bipolar 1 disorder, depressed, severe (HCC) 07/25/2021   Carrier of fragile X syndrome    Chronic constipation    Depression    Drug-seeking behavior    Essential  tremor    Headache    Ineffective individual coping 05/16/2022   Insomnia 01/12/2022   Intentional drug overdose (HCC) 06/05/2022   Neuromuscular disorder (HCC)    Normocytic anemia 06/05/2022   Overdose 07/22/2017   Overdose of acetaminophen 07/2017   and other meds   Overdose, intentional self-harm, initial encounter (HCC) 07/20/2021   Paranoia (HCC) 04/22/2021   Personality disorder (HCC)    Purposeful non-suicidal drug ingestion (HCC) 06/27/2021   Schizo-affective psychosis (HCC)    Schizoaffective disorder (HCC) 07/29/2022   Schizoaffective disorder, bipolar type (HCC)    Seizures (HCC)    Last seizure December 2017   Skin erythema 04/27/2022   Sleep apnea    Suicidal behavior 07/25/2021   Suicidal ideation    Suicide (HCC) 07/01/2021   Suicide attempt (HCC) 07/04/2021    Surgical History: Past Surgical History:  Procedure Laterality Date   MOUTH SURGERY  2009 or 2010    Medications:   Current Facility-Administered Medications:    0.9 %  sodium chloride infusion, 250 mL, Intravenous, Continuous, Gonfa, Taye T, MD, Stopped at 08/24/22 1347   albuterol (PROVENTIL) (2.5 MG/3ML) 0.083% nebulizer solution 3 mL, 3 mL, Inhalation, Q6H PRN, Gonfa, Taye T, MD   cholecalciferol (VITAMIN D3) 10 MCG (400 UNIT) tablet 400 Units, 400 Units, Oral, Daily, Bowman Higbie A, 400 Units at 09/30/22 0850   cyclobenzaprine (FLEXERIL) tablet 10 mg, 10 mg, Oral, QHS, Gonfa, Taye T, MD, 10 mg at 09/29/22 2016   diclofenac Sodium (VOLTAREN) 1 % topical gel 2 g, 2 g, Topical, QID, Gonfa, Taye T, MD, 2 g at 09/30/22 0850   DULoxetine (CYMBALTA) DR capsule 60 mg, 60 mg, Oral, Daily, Akintayo, Mojeed, MD, 60 mg at 09/30/22 0850   ferrous sulfate tablet 325 mg, 325 mg, Oral, BID WC, Gonfa, Taye T, MD, 325 mg at 09/30/22 0849   gabapentin (NEURONTIN) capsule 600 mg, 600 mg, Oral, TID, Alanda Slim, Taye T, MD, 600 mg at 09/30/22 0843   hydrOXYzine (ATARAX) tablet 25 mg, 25 mg, Oral, TID PRN, Alanda Slim,  Taye T, MD, 25 mg at 09/30/22 0848   ibuprofen (ADVIL) tablet 600 mg, 600 mg, Oral, TID PRN, Alanda Slim, Taye T, MD, 600 mg at 09/27/22 1506   LORazepam (ATIVAN) tablet 0.5 mg, 0.5 mg, Oral, BID PRN, Alanda Slim, Taye T, MD, 0.5 mg at 09/29/22 2017   metoprolol tartrate (LOPRESSOR) tablet 12.5 mg, 12.5 mg, Oral, BID, Vann, Jessica U, DO, 12.5 mg at 09/30/22 0849   mometasone-formoterol (DULERA) 200-5 MCG/ACT inhaler 2 puff, 2 puff, Inhalation, BID, Alanda Slim, Taye T, MD, 2 puff at 09/29/22 2014   mupirocin cream (BACTROBAN) 2 %, , Topical, Daily, Marguerita Merles Duchesne, DO, Given at 09/27/22 0859   OLANZapine (ZYPREXA) injection 10 mg, 10 mg, Intramuscular, Once PRN, Akintayo, Mojeed, MD   OLANZapine (ZYPREXA) tablet 15 mg, 15 mg, Oral, Daily, Rudolf Blizard A, 15 mg at 09/29/22 2017   OLANZapine zydis (ZYPREXA) disintegrating tablet 5 mg, 5 mg, Oral, BID AC, Mariel Craft, MD, 5 mg at 09/29/22 1617  ondansetron (ZOFRAN-ODT) disintegrating tablet 4 mg, 4 mg, Oral, Q8H PRN, Sheikh, Omair Latif, DO, 4 mg at 09/17/22 1700   Oral care mouth rinse, 15 mL, Mouth Rinse, PRN, Osvaldo Shipper, MD   pantoprazole (PROTONIX) EC tablet 40 mg, 40 mg, Oral, QAC breakfast, Gonfa, Taye T, MD, 40 mg at 09/30/22 0849   polyethylene glycol (MIRALAX / GLYCOLAX) packet 17 g, 17 g, Oral, BID PRN, Alanda Slim, Taye T, MD, 17 g at 09/27/22 2253   prazosin (MINIPRESS) capsule 1 mg, 1 mg, Oral, QHS, Gonfa, Taye T, MD, 1 mg at 09/29/22 2016   senna-docusate (Senokot-S) tablet 1 tablet, 1 tablet, Oral, BID PRN, Candelaria Stagers T, MD, 1 tablet at 09/27/22 2253   sodium bicarbonate tablet 650 mg, 650 mg, Oral, TID, Candelaria Stagers T, MD, 650 mg at 09/30/22 0843  Allergies: Allergies  Allergen Reactions   Bee Venom Anaphylaxis   Coconut Flavor Anaphylaxis and Rash   Fish Allergy Anaphylaxis   Geodon [Ziprasidone Hcl] Other (See Comments)    Pt states that this medication causes paralysis of the mouth.     Haloperidol And Related Other (See  Comments)    Pt states that this medication causes paralysis of the mouth, jaw locks up   Lithobid [Lithium] Other (See Comments)    Seizure-like activity    Roxicodone [Oxycodone] Other (See Comments)    Hallucinations    Seroquel [Quetiapine] Other (See Comments)    Severe drowsiness - Pt currently taking 12.5 mg BID, she is ok with this dose   Shellfish Allergy Anaphylaxis   Phenergan [Promethazine Hcl] Other (See Comments)    Chest pain     Prilosec [Omeprazole] Nausea And Vomiting and Other (See Comments)    Pt can take protonix with no problems    Sulfa Antibiotics Other (See Comments)    Chest pain    Tegretol [Carbamazepine] Nausea And Vomiting   Prozac [Fluoxetine] Other (See Comments)    Increased Depression and Suicidal thoughts   Tape Other (See Comments)    Skin tears, can only tolerate paper tape.   Tylenol [Acetaminophen] Rash and Other (See Comments)    Rash on face

## 2022-09-30 NOTE — Plan of Care (Signed)
  Problem: Education: Goal: Knowledge of General Education information will improve Description: Including pain rating scale, medication(s)/side effects and non-pharmacologic comfort measures Outcome: Progressing   Problem: Health Behavior/Discharge Planning: Goal: Ability to manage health-related needs will improve Outcome: Progressing   Problem: Clinical Measurements: Goal: Ability to maintain clinical measurements within normal limits will improve Outcome: Progressing Goal: Will remain free from infection Outcome: Progressing Goal: Diagnostic test results will improve Outcome: Progressing Goal: Cardiovascular complication will be avoided Outcome: Progressing   Problem: Activity: Goal: Risk for activity intolerance will decrease Outcome: Progressing   Problem: Coping: Goal: Level of anxiety will decrease Outcome: Progressing   Problem: Elimination: Goal: Will not experience complications related to bowel motility Outcome: Progressing   Problem: Pain Managment: Goal: General experience of comfort will improve Outcome: Progressing   Problem: Safety: Goal: Ability to remain free from injury will improve Outcome: Progressing   Problem: Skin Integrity: Goal: Risk for impaired skin integrity will decrease Outcome: Progressing   Problem: Role Relationship: Goal: Method of communication will improve Outcome: Progressing   Problem: Coping: Goal: Ability to adjust to condition or change in health will improve Outcome: Progressing   Problem: Fluid Volume: Goal: Ability to maintain a balanced intake and output will improve Outcome: Progressing   Problem: Health Behavior/Discharge Planning: Goal: Ability to identify and utilize available resources and services will improve Outcome: Progressing Goal: Ability to manage health-related needs will improve Outcome: Progressing   Problem: Metabolic: Goal: Ability to maintain appropriate glucose levels will  improve Outcome: Progressing   Problem: Nutritional: Goal: Maintenance of adequate nutrition will improve Outcome: Progressing Goal: Progress toward achieving an optimal weight will improve Outcome: Progressing   Problem: Skin Integrity: Goal: Risk for impaired skin integrity will decrease Outcome: Progressing   Problem: Tissue Perfusion: Goal: Adequacy of tissue perfusion will improve Outcome: Progressing

## 2022-09-30 NOTE — Progress Notes (Signed)
   09/30/22 1607  Spiritual Encounters  Type of Visit Follow up  Care provided to: Patient  Referral source Patient request  Reason for visit Routine spiritual support  OnCall Visit Yes  Spiritual Framework  Presenting Themes Meaning/purpose/sources of inspiration;Goals in life/care;Coping tools;Impactful experiences and emotions  Community/Connection Limited (Estranged from immediate family; ex-boyfriend is only support)  Needs/Challenges/Barriers Self-sabotage  Patient Stress Factors Family relationships;Other (Comment);Loss of control (Feeling stuck)  Goals  Self/Personal Goals Be released from hospital in order to pursue career  Interventions  Spiritual Care Interventions Made Established relationship of care and support;Compassionate presence;Reflective listening;Normalization of emotions  Intervention Outcomes  Outcomes Connection to spiritual care;Awareness of support;Reduced isolation  Spiritual Care Plan  Spiritual Care Issues Still Outstanding No further spiritual care needs at this time (see row info)  Recommendations for Clinical Staff Please contact Spiritual Care should pt request another visit from a chaplain.   Chaplain was paged to 5N in order to support pt per pt's request.   Chaplain provided pt a compassionate presence with the ability to speak openly and freely, as she wished. Chaplain utilized reflective listening and normalized pt's feelings throughout the conversation.   Pt described feeling "stuck," and her desire to be released from hospital in order to pursue a career and other "dreams." She states that she does not want to go to [facility], but she also expressed compliance in being placed there. Pt spoke about her estrangement from her immediate family, and how her family had "hurt me." According to pt, she has extremely limited support outside of the hospital. She also spoke briefly about past childhood traumas. Chaplain encouraged pt to speak with a licensed  counselor/therapist about these traumas.

## 2022-10-01 DIAGNOSIS — F315 Bipolar disorder, current episode depressed, severe, with psychotic features: Secondary | ICD-10-CM | POA: Diagnosis not present

## 2022-10-01 NOTE — Progress Notes (Signed)
PROGRESS NOTE    [name]  GUY:[id number] DOB: March 10, 1990 DOA: 08/22/2022 PCP: [facility]    Brief Narrative:  This 32 yrs old female with PMH significant of adjustment disorder with mixed anxiety and depressed mood, autism, bipolar 1 disorder, fragile x syndrome, seizures not on meds, schizoaffective disorder and multiple suicide attempts in the last year presented to the ED after ingesting numerous medications in a suicide attempt. She reported taking #30 metoprolol 25mg  tablets, #60 Hydroxyzine 25mg , #7 Lexapro 20mg , #21 Gabapentin 600mg , #7 Flexeril 10mg , #7 protonix 40mg  and #7 Prazosin 1mg . She was slightly bradycardic and hypotensive on EMS arrival which improved with fluids. Poison control was contacted and as she was within 2hrs of ingestion was given activated charcoal. Patient was placed under IVC in ED. Poison control also recommended ICU admission for close Qtc and electrolyte monitoring and possible pressors. She was intubated and started on vasopressor and glucagon. Also started on ceftriaxone for possible pneumonia. Patient was extubated on 7/18 and transferred to the floor on 7/19. She was transferred to [facility] service on 08/25/22. Psychiatry continues to follow. They recommend inpatient psychiatric hospitalization. Awaiting placement at [facility].    Assessment & Plan:   Principal Problem:   Bipolar I disorder, current or most recent episode depressed, with psychotic features (HCC) Active Problems:   Asthma   GAD (generalized anxiety disorder)   Suicide attempt (HCC)   Overdose   Drug overdose    Suicidal Attempt with intentional overdose:  She has taken multiple medications including # 30 metoprolol 25mg  tablets, # 60 Hydroxyzine 25mg , # 7 Lexapro 20mg , # 21 Gabapentin 600mg , # 7 Flexeril 10mg , # 7 protonix 40mg  and # 7 Prazosin 1mg .  Poison control was contacted.   Received charcoal in the ED.  Initially required vasopressor,  glucagon,  intubation and  mechanical ventilation.  Currently on IVC. Psychiatry placed APS referral as well.  Further care as per psychiatry.     Anxiety and Depression, Schizoaffective Disorder, Adjustment Disorder, bipolar disorder, fibromyalgia and auditory hallucination:  Meds per psych. Continue one-to-one sitter at bedside as she continues to harm herself   Community-acquired pneumonia: Completed antibiotic treatment for community-acquired pneumonia.   HTN: -resumed Lopressor.   Sinus Tachycardia: Continue Lopressor.   -d dimer normal -troponin normal   Metabolic Acidosis: -resolved   Hypomagnesemia: -repleted   Microcytic Anemia: Iron deficiency anemia.  Low ferritin.  Continue oral iron.   History of Seizure Disorder: -Stable.  Not on meds.   History of Asthma: Continue nebulizers.  Stable.   Dysuria: She was  catheterized during ICU.   - recently treated with antibiotics.   Hypoalbuminemia: Encourage oral intake.   Obesity: Body mass index is 36.03 kg/m.  Would benefit from weight loss as outpatient.   DVT prophylaxis: SCDs Code Status: Full code Family Communication:No family at bed side. Disposition Plan: per psych and TOC   Status is: Inpatient Remains inpatient appropriate because: Admitted for suicidal ideations,  evaluated by psychiatry. Patient is awaiting psych inpatient hospitalization.   Consultants:  Psychiatry    Subjective: sleeping  Objective: Vitals:   09/30/22 0849 09/30/22 1839 09/30/22 2031 10/01/22 0559  BP: 122/69 131/72 114/65 129/77  Pulse: (!) 104 100 (!) 105 97  Resp: 16 15 17 16   Temp: 98.1 F (36.7 C) 98.3 F (36.8 C) 98.4 F (36.9 C) 98 F (36.7 C)  TempSrc: Oral Oral    SpO2: 98% 99% 100% 100%  Weight:      Height:  Intake/Output Summary (Last 24 hours) at 10/01/2022 1208 Last data filed at 10/01/2022 1200 Gross per 24 hour  Intake 2560 ml  Output --  Net 2560 ml    Filed Weights   09/01/22 0300 09/02/22 0500  09/03/22 0402  Weight: 98.8 kg 99 kg 98.2 kg    Examination: sleeping    Data Reviewed: I have personally reviewed following labs and imaging studies  CBC: Recent Labs  Lab 09/25/22 0748  WBC 8.4  HGB 11.8*  HCT 38.2  MCV 80.1  PLT 263   Basic Metabolic Panel: Recent Labs  Lab 09/25/22 0748  NA 137  K 3.6  CL 103  CO2 22  GLUCOSE 94  BUN 11  CREATININE 0.67  CALCIUM 8.9  MG 1.7  PHOS 4.2   GFR: Estimated Creatinine Clearance: 116.1 mL/min (by C-G formula based on SCr of 0.67 mg/dL). Liver Function Tests: Recent Labs  Lab 09/25/22 0748  AST 16  ALT 19  ALKPHOS 68  BILITOT 0.3  PROT 6.9  ALBUMIN 3.6   No results for input(s): "LIPASE", "AMYLASE" in the last 168 hours. No results for input(s): "AMMONIA" in the last 168 hours. Coagulation Profile: No results for input(s): "INR", "PROTIME" in the last 168 hours. Cardiac Enzymes: No results for input(s): "CKTOTAL", "CKMB", "CKMBINDEX", "TROPONINI" in the last 168 hours. BNP (last 3 results) No results for input(s): "PROBNP" in the last 8760 hours. HbA1C: No results for input(s): "HGBA1C" in the last 72 hours. CBG: No results for input(s): "GLUCAP" in the last 168 hours. Lipid Profile: No results for input(s): "CHOL", "HDL", "LDLCALC", "TRIG", "CHOLHDL", "LDLDIRECT" in the last 72 hours. Thyroid Function Tests: No results for input(s): "TSH", "T4TOTAL", "FREET4", "T3FREE", "THYROIDAB" in the last 72 hours. Anemia Panel: No results for input(s): "VITAMINB12", "FOLATE", "FERRITIN", "TIBC", "IRON", "RETICCTPCT" in the last 72 hours. Sepsis Labs: No results for input(s): "PROCALCITON", "LATICACIDVEN" in the last 168 hours.  No results found for this or any previous visit (from the past 240 hour(s)).   Radiology Studies: No results found.  Scheduled Meds:  cholecalciferol  400 Units Oral Daily   cyclobenzaprine  10 mg Oral QHS   diclofenac Sodium  2 g Topical QID   DULoxetine  60 mg Oral Daily    ferrous sulfate  325 mg Oral BID WC   gabapentin  600 mg Oral TID   metoprolol tartrate  12.5 mg Oral BID   mometasone-formoterol  2 puff Inhalation BID   mupirocin cream   Topical Daily   OLANZapine  15 mg Oral Daily   OLANZapine zydis  5 mg Oral BID AC   pantoprazole  40 mg Oral QAC breakfast   prazosin  1 mg Oral QHS   sodium bicarbonate  650 mg Oral TID   Continuous Infusions:  sodium chloride Stopped (08/24/22 1347)     LOS: 40 days    Time spent: 25 mins    Joseph Art, DO [facility]   If 7PM-7AM, please contact night-coverage

## 2022-10-02 DIAGNOSIS — F315 Bipolar disorder, current episode depressed, severe, with psychotic features: Secondary | ICD-10-CM | POA: Diagnosis not present

## 2022-10-02 NOTE — Progress Notes (Signed)
PROGRESS NOTE    [name]  XBJ:[id number] DOB: [birth year] DOA: 08/22/2022 PCP: [facility]    Brief Narrative:  This 31 yrs old female with PMH significant of adjustment disorder with mixed anxiety and depressed mood, autism, bipolar 1 disorder, fragile x syndrome, seizures not on meds, schizoaffective disorder and multiple suicide attempts in the last year presented to the ED after ingesting numerous medications in a suicide attempt. She reported taking #30 metoprolol 25mg  tablets, #60 Hydroxyzine 25mg , #7 Lexapro 20mg , #21 Gabapentin 600mg , #7 Flexeril 10mg , #7 protonix 40mg  and #7 Prazosin 1mg . She was slightly bradycardic and hypotensive on EMS arrival which improved with fluids. Poison control was contacted and as she was within 2hrs of ingestion was given activated charcoal. Patient was placed under IVC in ED. Poison control also recommended ICU admission for close Qtc and electrolyte monitoring and possible pressors. She was intubated and started on vasopressor and glucagon. Also started on ceftriaxone for possible pneumonia. Patient was extubated on 7/18 and transferred to the floor on 7/19. She was transferred to [facility] service on 08/25/22. Psychiatry continues to follow. They recommend inpatient psychiatric hospitalization. Awaiting placement at [facility].    Assessment & Plan:   Principal Problem:   Bipolar I disorder, current or most recent episode depressed, with psychotic features (HCC) Active Problems:   Asthma   GAD (generalized anxiety disorder)   Suicide attempt (HCC)   Overdose   Drug overdose    Suicidal Attempt with intentional overdose:  She has taken multiple medications including # 30 metoprolol 25mg  tablets, # 60 Hydroxyzine 25mg , # 7 Lexapro 20mg , # 21 Gabapentin 600mg , # 7 Flexeril 10mg , # 7 protonix 40mg  and # 7 Prazosin 1mg .  Poison control was contacted.   Received charcoal in the ED.  Initially required vasopressor,  glucagon,  intubation and  mechanical ventilation.  Currently on IVC. Psychiatry placed APS referral as well.  Further care as per psychiatry.     Anxiety and Depression, Schizoaffective Disorder, Adjustment Disorder, bipolar disorder, fibromyalgia and auditory hallucination:  Meds per psych. Continue one-to-one sitter at bedside-- for last few days no further self harm behavior   Community-acquired pneumonia: Completed antibiotic treatment for community-acquired pneumonia.   HTN: -resumed Lopressor.   Sinus Tachycardia: Continue Lopressor.   -d dimer normal -troponin normal   Metabolic Acidosis: -resolved   Hypomagnesemia: -repleted   Microcytic Anemia: Iron deficiency anemia.  Low ferritin.  Continue oral iron.   History of Seizure Disorder: -Stable.  Not on meds.   History of Asthma: Continue nebulizers.  Stable.   Dysuria: She was  catheterized during ICU.   - recently treated with antibiotics.   Hypoalbuminemia: Encourage oral intake.   Obesity: Body mass index is 36.03 kg/m.  Would benefit from weight loss as outpatient.   DVT prophylaxis: SCDs Code Status: Full code Family Communication:No family at bed side. Disposition Plan: per psych and TOC   Status is: Inpatient Remains inpatient appropriate because: Admitted for suicidal ideations,  evaluated by psychiatry. Patient is awaiting psych inpatient hospitalization.   Consultants:  Psychiatry    Subjective: No overnight events  Objective: Vitals:   09/30/22 2031 10/01/22 0559 10/01/22 2016 10/02/22 0742  BP: 114/65 129/77 (!) 150/81 104/61  Pulse: (!) 105 97 (!) 109 (!) 110  Resp: 17 16 18 18   Temp: 98.4 F (36.9 C) 98 F (36.7 C) 98.3 F (36.8 C) 98.6 F (37 C)  TempSrc:    Oral  SpO2: 100% 100% 99% 100%  Weight:      Height:        Intake/Output Summary (Last 24 hours) at 10/02/2022 1145 Last data filed at 10/02/2022 0800 Gross per 24 hour  Intake 1240 ml  Output --  Net 1240 ml    Filed Weights    09/01/22 0300 09/02/22 0500 09/03/22 0402  Weight: 98.8 kg 99 kg 98.2 kg    Examination: sleeping    Data Reviewed: I have personally reviewed following labs and imaging studies  CBC: No results for input(s): "WBC", "NEUTROABS", "HGB", "HCT", "MCV", "PLT" in the last 168 hours.  Basic Metabolic Panel: No results for input(s): "NA", "K", "CL", "CO2", "GLUCOSE", "BUN", "CREATININE", "CALCIUM", "MG", "PHOS" in the last 168 hours.  GFR: Estimated Creatinine Clearance: 116.1 mL/min (by C-G formula based on SCr of 0.67 mg/dL). Liver Function Tests: No results for input(s): "AST", "ALT", "ALKPHOS", "BILITOT", "PROT", "ALBUMIN" in the last 168 hours.  No results for input(s): "LIPASE", "AMYLASE" in the last 168 hours. No results for input(s): "AMMONIA" in the last 168 hours. Coagulation Profile: No results for input(s): "INR", "PROTIME" in the last 168 hours. Cardiac Enzymes: No results for input(s): "CKTOTAL", "CKMB", "CKMBINDEX", "TROPONINI" in the last 168 hours. BNP (last 3 results) No results for input(s): "PROBNP" in the last 8760 hours. HbA1C: No results for input(s): "HGBA1C" in the last 72 hours. CBG: No results for input(s): "GLUCAP" in the last 168 hours. Lipid Profile: No results for input(s): "CHOL", "HDL", "LDLCALC", "TRIG", "CHOLHDL", "LDLDIRECT" in the last 72 hours. Thyroid Function Tests: No results for input(s): "TSH", "T4TOTAL", "FREET4", "T3FREE", "THYROIDAB" in the last 72 hours. Anemia Panel: No results for input(s): "VITAMINB12", "FOLATE", "FERRITIN", "TIBC", "IRON", "RETICCTPCT" in the last 72 hours. Sepsis Labs: No results for input(s): "PROCALCITON", "LATICACIDVEN" in the last 168 hours.  No results found for this or any previous visit (from the past 240 hour(s)).   Radiology Studies: No results found.  Scheduled Meds:  cholecalciferol  400 Units Oral Daily   cyclobenzaprine  10 mg Oral QHS   diclofenac Sodium  2 g Topical QID   DULoxetine  60 mg  Oral Daily   ferrous sulfate  325 mg Oral BID WC   gabapentin  600 mg Oral TID   metoprolol tartrate  12.5 mg Oral BID   mometasone-formoterol  2 puff Inhalation BID   mupirocin cream   Topical Daily   OLANZapine  15 mg Oral Daily   OLANZapine zydis  5 mg Oral BID AC   pantoprazole  40 mg Oral QAC breakfast   prazosin  1 mg Oral QHS   sodium bicarbonate  650 mg Oral TID   Continuous Infusions:  sodium chloride Stopped (08/24/22 1347)     LOS: 41 days    Time spent: 25 mins    Joseph Art, DO [facility]   If 7PM-7AM, please contact night-coverage

## 2022-10-02 NOTE — Plan of Care (Signed)
  Problem: Education: Goal: Knowledge of General Education information will improve Description: Including pain rating scale, medication(s)/side effects and non-pharmacologic comfort measures Outcome: Progressing   Problem: Health Behavior/Discharge Planning: Goal: Ability to manage health-related needs will improve Outcome: Progressing   Problem: Clinical Measurements: Goal: Ability to maintain clinical measurements within normal limits will improve Outcome: Progressing Goal: Will remain free from infection Outcome: Progressing Goal: Diagnostic test results will improve Outcome: Progressing Goal: Cardiovascular complication will be avoided Outcome: Progressing   Problem: Activity: Goal: Risk for activity intolerance will decrease Outcome: Progressing   Problem: Coping: Goal: Level of anxiety will decrease Outcome: Progressing   Problem: Elimination: Goal: Will not experience complications related to bowel motility Outcome: Progressing   Problem: Pain Managment: Goal: General experience of comfort will improve Outcome: Progressing   Problem: Safety: Goal: Ability to remain free from injury will improve Outcome: Progressing   Problem: Skin Integrity: Goal: Risk for impaired skin integrity will decrease Outcome: Progressing   Problem: Role Relationship: Goal: Method of communication will improve Outcome: Progressing   Problem: Coping: Goal: Ability to adjust to condition or change in health will improve Outcome: Progressing   Problem: Fluid Volume: Goal: Ability to maintain a balanced intake and output will improve Outcome: Progressing   Problem: Health Behavior/Discharge Planning: Goal: Ability to identify and utilize available resources and services will improve Outcome: Progressing Goal: Ability to manage health-related needs will improve Outcome: Progressing   Problem: Metabolic: Goal: Ability to maintain appropriate glucose levels will  improve Outcome: Progressing   Problem: Nutritional: Goal: Maintenance of adequate nutrition will improve Outcome: Progressing Goal: Progress toward achieving an optimal weight will improve Outcome: Progressing   Problem: Skin Integrity: Goal: Risk for impaired skin integrity will decrease Outcome: Progressing   Problem: Tissue Perfusion: Goal: Adequacy of tissue perfusion will improve Outcome: Progressing

## 2022-10-02 NOTE — Plan of Care (Signed)
  Problem: Education: Goal: Knowledge of General Education information will improve Description: Including pain rating scale, medication(s)/side effects and non-pharmacologic comfort measures Outcome: Not Progressing   Problem: Skin Integrity: Goal: Risk for impaired skin integrity will decrease Outcome: Progressing   Problem: Nutritional: Goal: Maintenance of adequate nutrition will improve Outcome: Progressing

## 2022-10-02 NOTE — Consult Note (Signed)
[name] Psychiatry Consult Evaluation  Service Date: October 02, 2022 LOS:  LOS: 41 days    Primary Psychiatric Diagnoses   Schizoaffective disorder, bipolar type  GAD  Borderline personality disorder  Assessment  Halaya Lova Maile is a 32 y.o. female with a past psychiatric history of borderline personality disorder, schizoaffective disorder bipolar type, and PTSD.  She has had numerous behavioral health admissions and an extensive history of suicide attempts.  Patient presents to [facility] ED for an attempted overdose on her prescription medications.  Briefly, this pt has had ~50 ED visits or psych hospitalizations during 2024; this continues a pattern of an inability to maintain safety in the outpatient setting going back multiple years. The vast majority of her suicide attempts have been via overdose on psychotropic medication. She has failed multiple levels of outpatient management up to and including CST; she has been declined by multiple psychiatric facilities due to futility, and she has been discharged by her CST team as of 8/8. She is currently on the [facility] priority wait list. She is not eligible for ACT services through [facility] due to BPD diagnosis.   We are trying to simplify patient's medication regimen to avoid polypharmacy especially in the setting of her recent overdose. Earlier in hospitalization during a period of improvement (roughly 8/1 to 8/8), we discussed medication management if and when she does return home including closer monitoring, less access to her medications through a locked cabinet, and her ex-boyfriend being more in charge of her medications in which was confirmed with her ex-boyfriend before being dropped by her CST.  After notification of discontinuation from CST services and ineligibility for ACT services, patient had acute worsening of reported hallucinations, suicidality, homicidality and agitation (fear of abandonment is central feature in BPD) and  consideration of community f/u was terminated. Patient has an extensive history of ER visits and hospitalizations in which she has displayed aggression, psychosis, and danger to self even while in proximity of a Recruitment consultant. Patient also shows high level of lethality, intensity, and frequency of suicidal attempts with multiple inpatient psychiatric admissions with little benefit so we continue to recommend [facility]; given wait list will also refer to other psychiatric hospitals. Patient is currently on the [facility] priority wait list and we are working closely with LCSW with bed availability; exploring other state hospital options.   8/23: Patient is stable in terms of mood and there are no notes concerning for self harm behaviors over the weekend. Patient declines ECT and ketamine treatments at this time.  Diagnoses:  Active Hospital problems: Principal Problem:   Bipolar I disorder, current or most recent episode depressed, with psychotic features (HCC) Active Problems:   Asthma   GAD (generalized anxiety disorder)   Suicide attempt (HCC)   Overdose   Drug overdose    Plan   ## Psychiatric Medication Recommendations:  -- Continue Zyprexa to 5 mg po before lunch and supper for mood stabilization during the day (last changed 8/23) -- Continue Zyprexa 15 mg po nightly for psychosis  -- Continue Cymbalta 60 mg daily -- Continue Gabapentin 600 mg TID for agitation, mood/anxiety -- continue prazosin 1 mg at bedtime for PTSD  Agitation Protocol -- PRN Zyprexa 10 mg IM for agitation -- PRN Ativan 0.5 mg oral BID PRN for agitation -- PRN Atarax 25 mg TID for anxiety, agitation  ## Medical Decision Making Capacity:   Capacity was not formally addressed during this encounter  ## Further Work-up:  -- Per primary --  most recent EKG on 08/30/2022 had QtC of 469  ## Disposition:  -- We recommend inpatient psychiatric hospitalization after medical hospitalization. Patient has been involuntarily  committed on 09/04/2022.  -- IVC renewed on 8/21 -- Patient remains on [facility] priority wait list  ## Behavioral / Environmental:  --  To minimize splitting of staff, assign one staff person to communicate all information from the team when feasible. -- Apply the established behavioral rules and protocols consistently throughout hospitalization (see nursing care order for details). Ensure that all staff members are aware of and adhere to the same guidelines to avoid confusion and mixed messages.  ## Safety and Observation Level:  - Based on my clinical evaluation, I estimate the patient to be at elevated risk of self harm in the current setting - At this time, we recommend continuing a 1:1 level of observation. This decision is based on my review of the chart including patient's history and current presentation, interview of the patient, mental status examination, and consideration of suicide risk including evaluating suicidal ideation, plan, intent, suicidal or self-harm behaviors, risk factors, and protective factors. This judgment is based on our ability to directly address suicide risk, implement suicide prevention strategies and develop a safety plan while the patient is in the clinical setting. Please contact our team if there is a concern that risk level has changed.  Suicide risk assessment  Patient has following modifiable risk factors for suicide: active suicidal ideation, under treated depression , recklessness, and lack of access to outpatient mental health resources, which we are addressing by continuing adequate safety precautions while she is hospitalized and adjusting medications   Patient has following non-modifiable or demographic risk factors for suicide: early widowhood, history of suicide attempt, history of self harm behavior, and psychiatric hospitalization  Patient has the following protective factors against suicide: Supportive friends  Thank you for this consult request.  Recommendations have been communicated to the primary team.  We will follow at this time.    Lance Muss, MD   Total Time Spent in Direct Patient Care:  I personally spent 25 minutes on the unit in direct patient care. The direct patient care time included face-to-face time with the patient, reviewing the patient's chart, communicating with other professionals, and coordinating care. Greater than 50% of this time was spent in counseling or coordinating care with the patient regarding goals of hospitalization, psycho-education, and discharge planning needs.    Psychiatric and Social History   Relevant Aspects of Hospital Course:  Admitted on 08/22/2022 for suicide attempt by overdose on her prescription medication.   Patient Report:  Patient seen in the morning.  She reports feeling similarly as yesterday, stating "I have not had any of those types of thoughts recently".  When asked what thoughts, she stated thoughts of wanting to hurt herself or others.  She enjoyed seeing the chaplain over the weekend.  She continues to feel that the Zyprexa is helpful for her mood throughout the day.  Denies SI, HI, AVH.  She declines trying ECT at this time.  She notes not trying in the past and does not elaborate on why she does not want to try ECT.  Collateral 8/14: called James, no response 8/12: tried to call Fayrene Fearing with no response.  8/9: called Debra with CST - stated she would be happy to conduct a termination session with [place] if she reached out . She has not been called by [name] during this hospitalization and Tierney has refused offered  termination session.  8/9: called Dr. Pershing Proud with [facility] to discuss pt's recent decompensation  8/5: spoke with Fayrene Fearing  [facility] in to note above   Past Psychiatric History: Previous Psych Diagnoses: Schizoaffective disorder bipolar type, Anxiety, Autism spectrum disorder, PTSD, Borderline personality disorder Prior inpatient treatment:  multiple, 15 ED visits and 4 inpatient admissions since 06/2022 (~50 total in 2024), admitted to [facility] from 07/29/22-08/02/2022, has been to [facility] for eight months in 2016 which led to period of stability, and was discharged from inpatient facility within a week of her OD Current/prior outpatient treatment: [facility], CST team twice a week Prior rehab hx: denies multiple Psychotherapy hx: [facility] History of suicide: 20-30 times by overdosing on pills, cutting or drinking 409 disinfectant History of homicide or aggression: denies Psychiatric medication history:  Previously trials of Tegretol, Haldol, Clozaril, Depakote, Invega, Lamictal, Seroquel, Asenapine, Abilify, vraylar (worsened suicidal thoughts), Lexapro, Zoloft  Neuromodulation history: denies  Social History:  Marital status: Widowed Children: 36 year old daughter Lives: with ex-boyfriend Fayrene Fearing, in two story home Employment: Disability, 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  Access to weapons: None   Exam Findings   Psychiatric Specialty Exam:  Presentation  General Appearance: -- (Lying in bed, lights off)  Eye Contact:Poor  Speech:Clear and Coherent  Speech Volume:Normal  Handedness:Right   Mood and Affect  Mood:-- "Fine"  Affect:-- Congruent    Thought Process  Thought Processes:Coherent  Descriptions of Associations:Intact  Orientation:Full (Time, Place and Person)  Thought Content:Logical  History of Schizophrenia/Schizoaffective disorder: Yes  Duration of Psychotic Symptoms:Greater than six months  Hallucinations: Denies  Ideas of Reference:None  Suicidal Thoughts: Denies  Homicidal Thoughts: Denies   Sensorium  Memory:Immediate Good; Recent Good; Remote Good  Judgment:Poor  Insight: Fair   Art therapist  Concentration:Fair  Attention Span:Fair  Recall:Fair  Fund of  Knowledge:Fair  Language:Fair   Psychomotor Activity  Psychomotor Activity: Normal  Assets  Assets:Communication Skills   Sleep  Sleep: Fair    Physical Exam: Vital signs:  Temp:  [98.3 F (36.8 C)-98.6 F (37 C)] 98.6 F (37 C) (08/27 0742) Pulse Rate:  [109-110] 110 (08/27 0742) Resp:  [18] 18 (08/27 0742) BP: (104-150)/(61-81) 104/61 (08/27 0742) SpO2:  [99 %-100 %] 100 % (08/27 0742) Physical Exam Vitals and nursing note reviewed. Exam conducted with a chaperone present.  Constitutional:      General: She is not in acute distress.    Appearance: She is obese. She is not ill-appearing.  HENT:     Head: Normocephalic.  Cardiovascular:     Rate and Rhythm: Normal rate.  Pulmonary:     Effort: Pulmonary effort is normal. No respiratory distress.  Neurological:     General: No focal deficit present.     Mental Status: She is alert.  Psychiatric:        Attention and Perception: Attention normal.        Speech: Speech normal.     Blood pressure 104/61, pulse (!) 110, temperature 98.6 F (37 C), temperature source Oral, resp. rate 18, height 5\' 5"  (1.651 m), weight 98.2 kg, last menstrual period 08/08/2022, SpO2 100%. Body mass index is 36.03 kg/m.   Other History   These have been pulled in through the EMR, reviewed, and updated if appropriate.   Family History:  The patient's family history includes Asthma in her father; Mental illness in her father; PDD in her brother; Seizures  in her brother.  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    Chronic constipation    Depression    Drug-seeking behavior    Essential tremor     Headache    Ineffective individual coping 05/16/2022   Insomnia 01/12/2022   Intentional drug overdose (HCC) 06/05/2022   Neuromuscular disorder (HCC)    Normocytic anemia 06/05/2022   Overdose 07/22/2017   Overdose of acetaminophen 07/2017   and other meds   Overdose, intentional self-harm, initial encounter (HCC) 07/20/2021   Paranoia (HCC) 04/22/2021   Personality disorder (HCC)    Purposeful non-suicidal drug ingestion (HCC) 06/27/2021   Schizo-affective psychosis (HCC)    Schizoaffective disorder (HCC) 07/29/2022   Schizoaffective disorder, bipolar type (HCC)    Seizures (HCC)    Last seizure December 2017   Skin erythema 04/27/2022   Sleep apnea    Suicidal behavior 07/25/2021   Suicidal ideation    Suicide (HCC) 07/01/2021   Suicide attempt (HCC) 07/04/2021    Surgical History: Past Surgical History:  Procedure Laterality Date   MOUTH SURGERY  2009 or 2010    Medications:   Current Facility-Administered Medications:    0.9 %  sodium chloride infusion, 250 mL, Intravenous, Continuous, Gonfa, Taye T, MD, Stopped at 08/24/22 1347   albuterol (PROVENTIL) (2.5 MG/3ML) 0.083% nebulizer solution 3 mL, 3 mL, Inhalation, Q6H PRN, Gonfa, Taye T, MD   cholecalciferol (VITAMIN D3) 10 MCG (400 UNIT) tablet 400 Units, 400 Units, Oral, Daily, Cinderella, Margaret A, 400 Units at 10/02/22 0949   cyclobenzaprine (FLEXERIL) tablet 10 mg, 10 mg, Oral, QHS, Gonfa, Taye T, MD, 10 mg at 10/01/22 2110   diclofenac Sodium (VOLTAREN) 1 % topical gel 2 g, 2 g, Topical, QID, Gonfa, Taye T, MD, 2 g at 10/02/22 0951   DULoxetine (CYMBALTA) DR capsule 60 mg, 60 mg, Oral, Daily, Akintayo, Mojeed, MD, 60 mg at 10/02/22 0949   ferrous sulfate tablet 325 mg, 325 mg, Oral, BID WC, Gonfa, Taye T, MD, 325 mg at 10/02/22 0949   gabapentin (NEURONTIN) capsule 600 mg, 600 mg, Oral, TID, Alanda Slim, Taye T, MD, 600 mg at 10/02/22 0949   hydrOXYzine (ATARAX) tablet 25 mg, 25 mg, Oral, TID PRN, Alanda Slim, Taye T, MD,  25 mg at 10/01/22 2228   ibuprofen (ADVIL) tablet 600 mg, 600 mg, Oral, TID PRN, Alanda Slim, Taye T, MD, 600 mg at 10/01/22 2228   LORazepam (ATIVAN) tablet 0.5 mg, 0.5 mg, Oral, BID PRN, Alanda Slim, Taye T, MD, 0.5 mg at 10/01/22 1301   metoprolol tartrate (LOPRESSOR) tablet 12.5 mg, 12.5 mg, Oral, BID, Vann, Jessica U, DO, 12.5 mg at 10/02/22 0949   mometasone-formoterol (DULERA) 200-5 MCG/ACT inhaler 2 puff, 2 puff, Inhalation, BID, Alanda Slim, Taye T, MD, 2 puff at 10/02/22 0818   mupirocin cream (BACTROBAN) 2 %, , Topical, Daily, Marguerita Merles [place], DO, Given at 09/27/22 0859   OLANZapine (ZYPREXA) injection 10 mg, 10 mg, Intramuscular, Once PRN, Akintayo, Mojeed, MD   OLANZapine (ZYPREXA) tablet 15 mg, 15 mg, Oral, Daily, Cinderella, Margaret A, 15 mg at 10/01/22 2100   OLANZapine zydis (ZYPREXA) disintegrating tablet 5 mg,  5 mg, Oral, BID AC, Mariel Craft, MD, 5 mg at 10/01/22 1702   ondansetron (ZOFRAN-ODT) disintegrating tablet 4 mg, 4 mg, Oral, Q8H PRN, Marguerita Merles Latif, DO, 4 mg at 09/17/22 1700   Oral care mouth rinse, 15 mL, Mouth Rinse, PRN, Osvaldo Shipper, MD   pantoprazole (PROTONIX) EC tablet 40 mg, 40 mg, Oral, QAC breakfast, Gonfa, Taye T, MD, 40 mg at 10/02/22 0949   polyethylene glycol (MIRALAX / GLYCOLAX) packet 17 g, 17 g, Oral, BID PRN, Candelaria Stagers T, MD, 17 g at 09/27/22 2253   prazosin (MINIPRESS) capsule 1 mg, 1 mg, Oral, QHS, Gonfa, Taye T, MD, 1 mg at 10/01/22 2110   senna-docusate (Senokot-S) tablet 1 tablet, 1 tablet, Oral, BID PRN, Candelaria Stagers T, MD, 1 tablet at 09/27/22 2253   sodium bicarbonate tablet 650 mg, 650 mg, Oral, TID, Candelaria Stagers T, MD, 650 mg at 10/02/22 0949  Allergies: Allergies  Allergen Reactions   Bee Venom Anaphylaxis   Coconut Flavor Anaphylaxis and Rash   Fish Allergy Anaphylaxis   Geodon [Ziprasidone Hcl] Other (See Comments)    Pt states that this medication causes paralysis of the mouth.     Haloperidol And Related Other (See Comments)    Pt  states that this medication causes paralysis of the mouth, jaw locks up   Lithobid [Lithium] Other (See Comments)    Seizure-like activity    Roxicodone [Oxycodone] Other (See Comments)    Hallucinations    Seroquel [Quetiapine] Other (See Comments)    Severe drowsiness - Pt currently taking 12.5 mg BID, she is ok with this dose   Shellfish Allergy Anaphylaxis   Phenergan [Promethazine Hcl] Other (See Comments)    Chest pain     Prilosec [Omeprazole] Nausea And Vomiting and Other (See Comments)    Pt can take protonix with no problems    Sulfa Antibiotics Other (See Comments)    Chest pain    Tegretol [Carbamazepine] Nausea And Vomiting   Prozac [Fluoxetine] Other (See Comments)    Increased Depression and Suicidal thoughts   Tape Other (See Comments)    Skin tears, can only tolerate paper tape.   Tylenol [Acetaminophen] Rash and Other (See Comments)    Rash on face

## 2022-10-03 DIAGNOSIS — F315 Bipolar disorder, current episode depressed, severe, with psychotic features: Secondary | ICD-10-CM | POA: Diagnosis not present

## 2022-10-03 NOTE — TOC Progression Note (Addendum)
Transition of Care [facility]) - Progression Note    Patient Details  Name: [name] MRN: [id number] Date of Birth: 03-10-1990  Transition of Care [facility]) CM/SW Contact  Lorri Frederick, LCSW Phone Number: 10/03/2022, 2:42 PM  Clinical Narrative:   IVC paperwork renewed, placed in drawer.  [employer] called for service.    1500: [facility] hospital admissions, [phone].  They can accept referral, unsure if pt can come based on out of catchment area, but can consider.  Referral faxed to [phone].   [facility].  [phone].  Referral faxed to [phone].         Expected Discharge Plan and Services                                               Social Determinants of Health (SDOH) Interventions SDOH Screenings   Food Insecurity: No Food Insecurity (07/29/2022)  Housing: Low Risk  (07/29/2022)  Transportation Needs: No Transportation Needs (07/29/2022)  Recent Concern: Transportation Needs - Unmet Transportation Needs (05/06/2022)  Utilities: Not At Risk (07/29/2022)  Alcohol Screen: Low Risk  (07/29/2022)  Depression (PHQ2-9): Medium Risk (03/10/2022)  Tobacco Use: Medium Risk (09/07/2022)    Readmission Risk Interventions    06/06/2022    3:38 PM  Readmission Risk Prevention Plan  Transportation Screening Complete  Medication Review (RN Care Manager) Complete  PCP or Specialist appointment within 3-5 days of discharge Complete  HRI or Home Care Consult Complete  SW Recovery Care/Counseling Consult Complete  Palliative Care Screening Not Applicable  Skilled Nursing Facility Not Applicable

## 2022-10-03 NOTE — Consult Note (Addendum)
[name] Consult Evaluation  Service Date: October 03, 2022 LOS:  LOS: 42 days    Primary Psychiatric Diagnoses   Schizoaffective disorder, bipolar type  GAD  Borderline personality disorder  Assessment  [name] is a 32 y.o. female with a past psychiatric history of borderline personality disorder, schizoaffective disorder bipolar type, and PTSD.  She has had numerous behavioral health admissions and an extensive history of suicide attempts.  Patient presents to [facility] ED for an attempted overdose on her prescription medications.  Briefly, this pt has had ~50 ED visits or psych hospitalizations during 2024; this continues a pattern of an inability to maintain safety in the outpatient setting going back multiple years. The vast majority of her suicide attempts have been via overdose on psychotropic medication. She has failed multiple levels of outpatient management up to and including CST; she has been declined by multiple psychiatric facilities due to futility, and she has been discharged by her CST team as of 8/8. She is currently on the [facility] priority wait list. She is not eligible for ACT services through [facility] due to BPD diagnosis.   We are trying to simplify patient's medication regimen to avoid polypharmacy especially in the setting of her recent overdose. Earlier in hospitalization during a period of improvement (roughly 8/1 to 8/8), we discussed medication management if and when she does return home including closer monitoring, less access to her medications through a locked cabinet, and her ex-boyfriend being more in charge of her medications in which was confirmed with her ex-boyfriend before being dropped by her CST.  After notification of discontinuation from CST services and ineligibility for ACT services, patient had acute worsening of reported hallucinations, suicidality, homicidality and agitation (fear of abandonment is central feature in BPD) and  consideration of community f/u was terminated. Patient has an extensive history of ER visits and hospitalizations in which she has displayed aggression, psychosis, and danger to self even while in proximity of a Recruitment consultant. Patient also shows high level of lethality, intensity, and frequency of suicidal attempts with multiple inpatient psychiatric admissions with little benefit so we continue to recommend [facility]; given wait list will also refer to other psychiatric hospitals. Patient is currently on the [facility] priority wait list and we are working closely with LCSW with bed availability; exploring other state hospital options.   8/28: Patient remains stable and has not been making suicidal, homicidal statements nor has been reporting psychosis for about a week. We will reach out to the CST team that was previously seeing patient to see if they have the bandwidth to see her in the future. We will continue current medication regimen as it shows benefit with patient's mood.  Diagnoses:  Active Hospital problems: Principal Problem:   Bipolar I disorder, current or most recent episode depressed, with psychotic features (HCC) Active Problems:   Asthma   GAD (generalized anxiety disorder)   Suicide attempt (HCC)   Overdose   Drug overdose    Plan   ## Psychiatric Medication Recommendations:  -- Continue Zyprexa to 5 mg po before lunch and supper for mood stabilization during the day (last changed 8/23) -- Continue Zyprexa 15 mg po nightly for psychosis  -- Continue Cymbalta 60 mg daily -- Continue Gabapentin 600 mg TID for agitation, mood/anxiety -- continue prazosin 1 mg at bedtime for PTSD  Agitation Protocol -- PRN Zyprexa 10 mg IM for agitation -- PRN Ativan 0.5 mg oral BID PRN for agitation -- PRN Atarax  25 mg TID for anxiety, agitation  ## Medical Decision Making Capacity:   Capacity was not formally addressed during this encounter  ## Further Work-up:  -- Per primary -- most recent  EKG on 08/30/2022 had QtC of 469  ## Disposition:  -- We recommend inpatient psychiatric hospitalization after medical hospitalization. Patient has been involuntarily committed on 09/04/2022.  -- IVC renewed on 8/28 -- Patient remains on [facility] priority wait list  ## Behavioral / Environmental:  --  To minimize splitting of staff, assign one staff person to communicate all information from the team when feasible. -- Apply the established behavioral rules and protocols consistently throughout hospitalization (see nursing care order for details). Ensure that all staff members are aware of and adhere to the same guidelines to avoid confusion and mixed messages.  ## Safety and Observation Level:  - Based on my clinical evaluation, I estimate the patient to be at elevated risk of self harm in the current setting - At this time, we recommend continuing a 1:1 level of observation. This decision is based on my review of the chart including patient's history and current presentation, interview of the patient, mental status examination, and consideration of suicide risk including evaluating suicidal ideation, plan, intent, suicidal or self-harm behaviors, risk factors, and protective factors. This judgment is based on our ability to directly address suicide risk, implement suicide prevention strategies and develop a safety plan while the patient is in the clinical setting. Please contact our team if there is a concern that risk level has changed.  Suicide risk assessment  Patient has following modifiable risk factors for suicide: active suicidal ideation, under treated depression , recklessness, and lack of access to outpatient mental health resources, which we are addressing by continuing adequate safety precautions while she is hospitalized and adjusting medications   Patient has following non-modifiable or demographic risk factors for suicide: early widowhood, history of suicide attempt, history of self harm  behavior, and psychiatric hospitalization  Patient has the following protective factors against suicide: Supportive friends  Thank you for this consult request. Recommendations have been communicated to the primary team.  We will follow at this time.    Lance Muss, MD   Total Time Spent in Direct Patient Care:  I personally spent 25 minutes on the unit in direct patient care. The direct patient care time included face-to-face time with the patient, reviewing the patient's chart, communicating with other professionals, and coordinating care. Greater than 50% of this time was spent in counseling or coordinating care with the patient regarding goals of hospitalization, psycho-education, and discharge planning needs.    Psychiatric and Social History   Relevant Aspects of Hospital Course:  Admitted on 08/22/2022 for suicide attempt by overdose on her prescription medication.   Patient Report:  Patient seen this morning. She notes feeling "alright". She denies SI, HI, and AVH, stating "I just feel down". She recalls being visited by her friend over the weekend and going on walks. I encourage patient to continue going on walks and engaging in activities like interacting with social support. She continues to feel the medications have been helpful, denying any adverse effects.  Collateral 8/14: called James, no response 8/12: tried to call Fayrene Fearing with no response.  8/9: called Debra with CST - stated she would be happy to conduct a termination session with Merredith if she reached out . She has not been called by [name] during this hospitalization and Rashita has refused offered termination session.  8/9:  called Dr. Pershing Proud with [facility] to discuss pt's recent decompensation  8/5: spoke with Fayrene Fearing  [facility] [employer] in to note above   Past Psychiatric History: Previous Psych Diagnoses: Schizoaffective disorder bipolar type, Anxiety, Autism spectrum disorder, PTSD, Borderline personality  disorder Prior inpatient treatment: multiple, 15 ED visits and 4 inpatient admissions since 06/2022 (~50 total in 2024), admitted to [facility] [place] from 07/29/22-08/02/2022, has been to [facility] for eight months in 2016 which led to period of stability, and was discharged from inpatient facility within a week of her OD Current/prior outpatient treatment: [facility], CST team twice a week Prior rehab hx: denies multiple Psychotherapy hx: [facility] History of suicide: 20-30 times by overdosing on pills, cutting or drinking 409 disinfectant History of homicide or aggression: denies Psychiatric medication history:  Previously trials of Tegretol, Haldol, Clozaril, Depakote, Invega, Lamictal, Seroquel, Asenapine, Abilify, vraylar (worsened suicidal thoughts), Lexapro, Zoloft  Neuromodulation history: denies  Social History:  Marital status: Widowed Children: 32 year old daughter Lives: with ex-boyfriend Fayrene Fearing, in two story home Employment: Disability, 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  Access to weapons: None   Exam Findings   Psychiatric Specialty Exam:  Presentation  General Appearance: Appropriate for Environment; Casual  Eye Contact:Fair  Speech:Clear and Coherent  Speech Volume:Normal  Handedness:Right   Mood and Affect  Mood:-- "Fine"  Affect:-- Congruent    Thought Process  Thought Processes:Coherent  Descriptions of Associations:Intact  Orientation:Full (Time, Place and Person)  Thought Content:Logical  History of Schizophrenia/Schizoaffective disorder: Yes  Duration of Psychotic Symptoms:Greater than six months  Hallucinations: Denies  Ideas of Reference:None  Suicidal Thoughts: Denies  Homicidal Thoughts: Denies   Sensorium  Memory:Immediate Good; Recent Good; Remote Good  Judgment:Impaired  Insight: Fair   Art therapist  Concentration:Fair  Attention  Span:Fair  Recall:Fair  [employer] of Knowledge:Fair  Language:Fair   Psychomotor Activity  Psychomotor Activity: Normal  Assets  Assets:Communication Skills; Resilience   Sleep  Sleep: Fair    Physical Exam: Vital signs:  Temp:  [97.7 F (36.5 C)-98.5 F (36.9 C)] 98.5 F (36.9 C) (08/28 0702) Pulse Rate:  [96-99] 96 (08/28 0702) Resp:  [16-18] 18 (08/28 0702) BP: (113-130)/(53-88) 113/63 (08/28 0702) SpO2:  [95 %-100 %] 98 % (08/28 5409) Physical Exam Vitals and nursing note reviewed. Exam conducted with a chaperone present.  Constitutional:      General: She is not in acute distress.    Appearance: She is obese. She is not ill-appearing.  HENT:     Head: Normocephalic.  Cardiovascular:     Rate and Rhythm: Normal rate.  Pulmonary:     Effort: Pulmonary effort is normal. No respiratory distress.  Neurological:     General: No focal deficit present.     Mental Status: She is alert.  Psychiatric:        Attention and Perception: Attention normal.        Speech: Speech normal.     Blood pressure 113/63, pulse 96, temperature 98.5 F (36.9 C), temperature source Oral, resp. rate 18, height 5\' 5"  (1.651 m), weight 98.2 kg, last menstrual period 08/08/2022, SpO2 98%. Body mass index is 36.03 kg/m.   Other History   These have been pulled in through the EMR, reviewed, and updated if appropriate.   Family History:  The patient's family history includes Asthma in her father; Mental illness in her father; PDD in her brother; Seizures in her brother.  Medical History:  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 fragile X syndrome    Chronic constipation    Depression     Drug-seeking behavior    Essential tremor    Headache    Ineffective individual coping 05/16/2022   Insomnia 01/12/2022   Intentional drug overdose (HCC) 06/05/2022   Neuromuscular disorder (HCC)    Normocytic anemia 06/05/2022   Overdose 07/22/2017   Overdose of acetaminophen 07/2017   and other meds   Overdose, intentional self-harm, initial encounter (HCC) 07/20/2021   Paranoia (HCC) 04/22/2021   Personality disorder (HCC)    Purposeful non-suicidal drug ingestion (HCC) 06/27/2021   Schizo-affective psychosis (HCC)    Schizoaffective disorder (HCC) 07/29/2022   Schizoaffective disorder, bipolar type (HCC)    Seizures (HCC)    Last seizure December 2017   Skin erythema 04/27/2022   Sleep apnea    Suicidal behavior 07/25/2021   Suicidal ideation    Suicide (HCC) 07/01/2021   Suicide attempt (HCC) 07/04/2021    Surgical History: Past Surgical History:  Procedure Laterality Date   MOUTH SURGERY  2009 or 2010    Medications:   Current Facility-Administered Medications:    0.9 %  sodium chloride infusion, 250 mL, Intravenous, Continuous, Gonfa, Taye T, MD, Stopped at 08/24/22 1347   albuterol (PROVENTIL) (2.5 MG/3ML) 0.083% nebulizer solution 3 mL, 3 mL, Inhalation, Q6H PRN, Gonfa, Taye T, MD   cholecalciferol (VITAMIN D3) 10 MCG (400 UNIT) tablet 400 Units, 400 Units, Oral, Daily, Cinderella, Margaret A, 400 Units at 10/03/22 2951   cyclobenzaprine (FLEXERIL) tablet 10 mg, 10 mg, Oral, QHS, Gonfa, Taye T, MD, 10 mg at 10/02/22 2143   diclofenac Sodium (VOLTAREN) 1 % topical gel 2 g, 2 g, Topical, QID, Gonfa, Taye T, MD, 2 g at 10/03/22 0907   DULoxetine (CYMBALTA) DR capsule 60 mg, 60 mg, Oral, Daily, Akintayo, Mojeed, MD, 60 mg at 10/03/22 0902   ferrous sulfate tablet 325 mg, 325 mg, Oral, BID WC, Gonfa, Taye T, MD, 325 mg at 10/03/22 0820   gabapentin (NEURONTIN) capsule 600 mg, 600 mg, Oral, TID, Alanda Slim, Taye T, MD, 600 mg at 10/03/22 0901   hydrOXYzine (ATARAX) tablet  25 mg, 25 mg, Oral, TID PRN, Alanda Slim, Taye T, MD, 25 mg at 10/01/22 2228   ibuprofen (ADVIL) tablet 600 mg, 600 mg, Oral, TID PRN, Alanda Slim, Taye T, MD, 600 mg at 10/02/22 2034   LORazepam (ATIVAN) tablet 0.5 mg, 0.5 mg, Oral, BID PRN, Alanda Slim, Taye T, MD, 0.5 mg at 10/01/22 1301   metoprolol tartrate (LOPRESSOR) tablet 12.5 mg, 12.5 mg, Oral, BID, Vann, Jessica U, DO, 12.5 mg at 10/03/22 0902   mometasone-formoterol (DULERA) 200-5 MCG/ACT inhaler 2 puff, 2 puff, Inhalation, BID, Alanda Slim, Taye T, MD, 2 puff at 10/02/22 1925   mupirocin cream (BACTROBAN) 2 %, , Topical, Daily, Marguerita Merles [place], DO, Given at 09/27/22 0859   OLANZapine (ZYPREXA) injection 10 mg, 10 mg, Intramuscular, Once PRN, Akintayo, Mojeed, MD   OLANZapine (ZYPREXA) tablet 15 mg, 15 mg, Oral, Daily, Cinderella, Margaret A, 15 mg at 10/02/22 2034   OLANZapine zydis (ZYPREXA) disintegrating tablet 5 mg, 5 mg, Oral, BID AC, Maurer,  Orlean Bradford, MD, 5 mg at 10/02/22 1744   ondansetron (ZOFRAN-ODT) disintegrating tablet 4 mg, 4 mg, Oral, Q8H PRN, Marguerita Merles Latif, DO, 4 mg at 09/17/22 1700   Oral care mouth rinse, 15 mL, Mouth Rinse, PRN, Osvaldo Shipper, MD   pantoprazole (PROTONIX) EC tablet 40 mg, 40 mg, Oral, QAC breakfast, Gonfa, Taye T, MD, 40 mg at 10/03/22 0830   polyethylene glycol (MIRALAX / GLYCOLAX) packet 17 g, 17 g, Oral, BID PRN, Candelaria Stagers T, MD, 17 g at 09/27/22 2253   prazosin (MINIPRESS) capsule 1 mg, 1 mg, Oral, QHS, Gonfa, Taye T, MD, 1 mg at 10/02/22 2144   senna-docusate (Senokot-S) tablet 1 tablet, 1 tablet, Oral, BID PRN, Candelaria Stagers T, MD, 1 tablet at 09/27/22 2253   sodium bicarbonate tablet 650 mg, 650 mg, Oral, TID, Candelaria Stagers T, MD, 650 mg at 10/03/22 4098  Allergies: Allergies  Allergen Reactions   Bee Venom Anaphylaxis   Coconut Flavor Anaphylaxis and Rash   Fish Allergy Anaphylaxis   Geodon [Ziprasidone Hcl] Other (See Comments)    Pt states that this medication causes paralysis of the mouth.      Haloperidol And Related Other (See Comments)    Pt states that this medication causes paralysis of the mouth, jaw locks up   Lithobid [Lithium] Other (See Comments)    Seizure-like activity    Roxicodone [Oxycodone] Other (See Comments)    Hallucinations    Seroquel [Quetiapine] Other (See Comments)    Severe drowsiness - Pt currently taking 12.5 mg BID, she is ok with this dose   Shellfish Allergy Anaphylaxis   Phenergan [Promethazine Hcl] Other (See Comments)    Chest pain     Prilosec [Omeprazole] Nausea And Vomiting and Other (See Comments)    Pt can take protonix with no problems    Sulfa Antibiotics Other (See Comments)    Chest pain    Tegretol [Carbamazepine] Nausea And Vomiting   Prozac [Fluoxetine] Other (See Comments)    Increased Depression and Suicidal thoughts   Tape Other (See Comments)    Skin tears, can only tolerate paper tape.   Tylenol [Acetaminophen] Rash and Other (See Comments)    Rash on face

## 2022-10-03 NOTE — Progress Notes (Signed)
PROGRESS NOTE    [name]  ZOX:[id number] DOB: [birth year] DOA: 08/22/2022 [facility]: [facility]    Brief Narrative:  This 32 yrs old female with PMH significant of adjustment disorder with mixed anxiety and depressed mood, autism, bipolar 1 disorder, fragile x syndrome, seizures not on meds, schizoaffective disorder and multiple suicide attempts in the last year presented to the ED after ingesting numerous medications in a suicide attempt. She reported taking #30 metoprolol 25mg  tablets, #60 Hydroxyzine 25mg , #7 Lexapro 20mg , #21 Gabapentin 600mg , #7 Flexeril 10mg , #7 protonix 40mg  and #7 Prazosin 1mg . She was slightly bradycardic and hypotensive on EMS arrival which improved with fluids. Poison control was contacted and as she was within 2hrs of ingestion was given activated charcoal. Patient was placed under IVC in ED. Poison control also recommended ICU admission for close Qtc and electrolyte monitoring and possible pressors. She was intubated and started on vasopressor and glucagon. Also started on ceftriaxone for possible pneumonia. Patient was extubated on 7/18 and transferred to the floor on 7/19. She was transferred to [facility] service on 08/25/22. Psychiatry continues to follow. They recommend inpatient psychiatric hospitalization. Awaiting placement at [facility].    Assessment & Plan:  Suicidal Attempt with intentional overdose:  She presented after having taken multiple medications including # 30 metoprolol 25mg  tablets, # 60 Hydroxyzine 25mg , # 7 Lexapro 20mg , # 21 Gabapentin 600mg , # 7 Flexeril 10mg , # 7 protonix 40mg  and # 7 Prazosin 1mg .  Poison control was contacted.   Received charcoal in the ED.  Initially required vasopressor,  glucagon,  intubation and mechanical ventilation.   Currently on IVC.  Referral was made to APS by psychiatry.   Further care as per psychiatry.   Seems to be stable for the most part.  Awaiting placement.   Anxiety and Depression, Schizoaffective  Disorder, Adjustment Disorder, bipolar disorder, fibromyalgia and auditory hallucination:  Meds per psych.   Community-acquired pneumonia: Completed antibiotic treatment for community-acquired pneumonia.   HTN: On metoprolol and prazosin.  Blood pressure is reasonably well-controlled.   Sinus Tachycardia: Continue Lopressor.   -d dimer normal -troponin normal   Metabolic Acidosis: -resolved   Hypomagnesemia: -repleted   Microcytic Anemia: Iron deficiency anemia.  Low ferritin.  Continue oral iron.   History of Seizure Disorder: -Stable.  Not on meds.   History of Asthma: Continue nebulizers.  Stable.   Dysuria: She was  catheterized during ICU.   - recently treated with antibiotics.   Hypoalbuminemia: Encourage oral intake.   Obesity: Body mass index is 36.03 kg/m.  Would benefit from weight loss as outpatient.   DVT prophylaxis: SCDs Code Status: Full code Family Communication:No family at bed side. Disposition Plan: per psych and [facility]    Consultants:  Psychiatry   Subjective: Patient going to the bathroom to have a shower.  Denies any complaints.  Objective: Vitals:   10/02/22 1656 10/02/22 1925 10/02/22 2142 10/03/22 0702  BP: 130/88  (!) 125/53 113/63  Pulse:  99 99 96  Resp: 16 18 18 18   Temp: 97.7 F (36.5 C)  98.3 F (36.8 C) 98.5 F (36.9 C)  TempSrc: Oral  Oral Oral  SpO2: 96% 95% 100% 98%  Weight:      Height:        Intake/Output Summary (Last 24 hours) at 10/03/2022 0923 Last data filed at 10/03/2022 0030 Gross per 24 hour  Intake 1320 ml  Output --  Net 1320 ml    Filed Weights   09/01/22 0300  09/02/22 0500 09/03/22 0402  Weight: 98.8 kg 99 kg 98.2 kg    Examination: No distress noted.    Data Reviewed: I have personally reviewed following labs and imaging studies  CBC: No results for input(s): "WBC", "NEUTROABS", "HGB", "HCT", "MCV", "PLT" in the last 168 hours.  Basic Metabolic Panel: No results for input(s):  "NA", "K", "CL", "CO2", "GLUCOSE", "BUN", "CREATININE", "CALCIUM", "MG", "PHOS" in the last 168 hours.  GFR: Estimated Creatinine Clearance: 116.1 mL/min (by C-G formula based on SCr of 0.67 mg/dL). Liver Function Tests: No results for input(s): "AST", "ALT", "ALKPHOS", "BILITOT", "PROT", "ALBUMIN" in the last 168 hours.  No results for input(s): "LIPASE", "AMYLASE" in the last 168 hours. No results for input(s): "AMMONIA" in the last 168 hours. Coagulation Profile: No results for input(s): "INR", "PROTIME" in the last 168 hours. Cardiac Enzymes: No results for input(s): "CKTOTAL", "CKMB", "CKMBINDEX", "TROPONINI" in the last 168 hours. BNP (last 3 results) No results for input(s): "PROBNP" in the last 8760 hours. HbA1C: No results for input(s): "HGBA1C" in the last 72 hours. CBG: No results for input(s): "GLUCAP" in the last 168 hours. Lipid Profile: No results for input(s): "CHOL", "HDL", "LDLCALC", "TRIG", "CHOLHDL", "LDLDIRECT" in the last 72 hours. Thyroid Function Tests: No results for input(s): "TSH", "T4TOTAL", "FREET4", "T3FREE", "THYROIDAB" in the last 72 hours. Anemia Panel: No results for input(s): "VITAMINB12", "FOLATE", "FERRITIN", "TIBC", "IRON", "RETICCTPCT" in the last 72 hours. Sepsis Labs: No results for input(s): "PROCALCITON", "LATICACIDVEN" in the last 168 hours.  No results found for this or any previous visit (from the past 240 hour(s)).   Radiology Studies: No results found.  Scheduled Meds:  cholecalciferol  400 Units Oral Daily   cyclobenzaprine  10 mg Oral QHS   diclofenac Sodium  2 g Topical QID   DULoxetine  60 mg Oral Daily   ferrous sulfate  325 mg Oral BID WC   gabapentin  600 mg Oral TID   metoprolol tartrate  12.5 mg Oral BID   mometasone-formoterol  2 puff Inhalation BID   mupirocin cream   Topical Daily   OLANZapine  15 mg Oral Daily   OLANZapine zydis  5 mg Oral BID AC   pantoprazole  40 mg Oral QAC breakfast   prazosin  1 mg Oral  QHS   sodium bicarbonate  650 mg Oral TID   Continuous Infusions:  sodium chloride Stopped (08/24/22 1347)     LOS: 42 days    Osvaldo Shipper,  [facility]   If 7PM-7AM, please contact night-coverage

## 2022-10-03 NOTE — Plan of Care (Signed)
  Problem: Education: Goal: Knowledge of General Education information will improve Description: Including pain rating scale, medication(s)/side effects and non-pharmacologic comfort measures Outcome: Progressing   Problem: Coping: Goal: Level of anxiety will decrease Outcome: Progressing   

## 2022-10-04 DIAGNOSIS — F250 Schizoaffective disorder, bipolar type: Secondary | ICD-10-CM

## 2022-10-04 DIAGNOSIS — F603 Borderline personality disorder: Secondary | ICD-10-CM | POA: Diagnosis not present

## 2022-10-04 DIAGNOSIS — F315 Bipolar disorder, current episode depressed, severe, with psychotic features: Secondary | ICD-10-CM | POA: Diagnosis not present

## 2022-10-04 DIAGNOSIS — F411 Generalized anxiety disorder: Secondary | ICD-10-CM | POA: Diagnosis not present

## 2022-10-04 MED ORDER — OLANZAPINE 10 MG IM SOLR
10.0000 mg | Freq: Every day | INTRAMUSCULAR | Status: DC | PRN
  Administered 2022-10-07 – 2022-10-15 (×2): 10 mg via INTRAMUSCULAR
  Filled 2022-10-04 (×3): qty 10

## 2022-10-04 NOTE — TOC Progression Note (Addendum)
Transition of Care [facility]) - Progression Note    Patient Details  Name: [name] MRN: [id number] Date of Birth: 11-06-1990  Transition of Care [facility]) CM/SW Contact  Lorri Frederick, LCSW Phone Number: 10/04/2022, 8:30 AM  Clinical Narrative:   Message received yesterday from [facility].  Pt is out of their catchment area, needs to be referred to [facility].  Cannot accept referral.  CSW also received message from [facility] asking for return call.  Spoke to admissions/Cherry and was asked to call back later.  [id number]: [facility].  They cannot accept referral out of their catchment area.  Pt will need to be admitted to [facility].   1400: CSW LM with pt Community support team: Genie: [phone] ,Lupita Leash [phone]        Expected Discharge Plan and Services                                               Social Determinants of Health (SDOH) Interventions SDOH Screenings   Food Insecurity: No Food Insecurity (07/29/2022)  Housing: Low Risk  (07/29/2022)  Transportation Needs: No Transportation Needs (07/29/2022)  Recent Concern: Transportation Needs - Unmet Transportation Needs (05/06/2022)  Utilities: Not At Risk (07/29/2022)  Alcohol Screen: Low Risk  (07/29/2022)  Depression (PHQ2-9): Medium Risk (03/10/2022)  Tobacco Use: Medium Risk (09/07/2022)    Readmission Risk Interventions    06/06/2022    3:38 PM  Readmission Risk Prevention Plan  Transportation Screening Complete  Medication Review (RN Care Manager) Complete  PCP or Specialist appointment within 3-5 days of discharge Complete  HRI or Home Care Consult Complete  SW Recovery Care/Counseling Consult Complete  Palliative Care Screening Not Applicable  Skilled Nursing Facility Not Applicable

## 2022-10-04 NOTE — Progress Notes (Signed)
PROGRESS NOTE    [name]  ION:[id number] DOB: [birth year] DOA: 08/22/2022 PCP: [facility]    Brief Narrative:  This 32 yrs old female with PMH significant of adjustment disorder with mixed anxiety and depressed mood, autism, bipolar 1 disorder, fragile x syndrome, seizures not on meds, schizoaffective disorder and multiple suicide attempts in the last year presented to the ED after ingesting numerous medications in a suicide attempt. She reported taking #30 metoprolol 25mg  tablets, #60 Hydroxyzine 25mg , #7 Lexapro 20mg , #21 Gabapentin 600mg , #7 Flexeril 10mg , #7 protonix 40mg  and #7 Prazosin 1mg . She was slightly bradycardic and hypotensive on EMS arrival which improved with fluids. Poison control was contacted and as she was within 2hrs of ingestion was given activated charcoal. Patient was placed under IVC in ED. Poison control also recommended ICU admission for close Qtc and electrolyte monitoring and possible pressors. She was intubated and started on vasopressor and glucagon. Also started on ceftriaxone for possible pneumonia. Patient was extubated on 7/18 and transferred to the floor on 7/19. She was [facility] to [facility] service on 08/25/22. Psychiatry continues to follow. They recommend inpatient psychiatric hospitalization. Awaiting placement at [facility].    Assessment & Plan:  Suicidal Attempt with intentional overdose:  She presented after having taken multiple medications including # 30 metoprolol 25mg  tablets, # 60 Hydroxyzine 25mg , # 7 Lexapro 20mg , # 21 Gabapentin 600mg , # 7 Flexeril 10mg , # 7 protonix 40mg  and # 7 Prazosin 1mg .  Poison control was contacted.   Received charcoal in the ED.  Initially required vasopressor,  glucagon,  intubation and mechanical ventilation.   Currently on IVC.  Referral was made to [facility]PS by psychiatry.   Further care as per psychiatry.   Seems to be stable for the most part.  Awaiting placement.   Anxiety and Depression, Schizoaffective  Disorder, Adjustment Disorder, bipolar disorder, fibromyalgia and auditory hallucination:  Meds per psych.   Community-acquired pneumonia: Completed antibiotic treatment for community-acquired pneumonia.   HTN: On metoprolol and prazosin.  Blood pressure is reasonably well-controlled.   Sinus Tachycardia: Continue Lopressor.   -d dimer normal -troponin normal   Metabolic Acidosis: -resolved   Hypomagnesemia: -repleted   Microcytic Anemia: Iron deficiency anemia.  Low ferritin.  Continue oral iron.   History of Seizure Disorder: -Stable.  Not on meds.   History of Asthma: Continue nebulizers.  Stable.   Dysuria: She was  catheterized during ICU.   - recently treated with antibiotics.   Hypoalbuminemia: Encourage oral intake.   Obesity: Body mass index is 36.03 kg/m.  Would benefit from weight loss as outpatient.  Labs ordered for this morning and still pending.  DVT prophylaxis: SCDs Code Status: Full code Family Communication:No family at bed side. Disposition Plan: per psych and [facility]    Consultants:  Psychiatry   Subjective: Patient denies any complaints this morning.  Lying on the bed.  Objective: Vitals:   10/03/22 2030 10/03/22 2116 10/04/22 0809 10/04/22 0918  BP: 97/68 133/66  (!) 142/77  Pulse: 99 (!) 102 99 (!) 109  Resp: 18  18 18   Temp: 98.5 F (36.9 C)   97.9 F (36.6 C)  TempSrc: Oral   Oral  SpO2: 100%  100% 98%  Weight:      Height:        Intake/Output Summary (Last 24 hours) at 10/04/2022 1109 Last data filed at 10/04/2022 0921 Gross per 24 hour  Intake 1440 ml  Output --  Net 1440 ml    Ceasar Mons  Weights   09/01/22 0300 09/02/22 0500 09/03/22 0402  Weight: 98.8 kg 99 kg 98.2 kg    Examination: No distress noted.    Data Reviewed: I have personally reviewed following labs and imaging studies  CBC: No results for input(s): "WBC", "NEUTROABS", "HGB", "HCT", "MCV", "PLT" in the last 168 hours.  Basic Metabolic  Panel: No results for input(s): "NA", "K", "CL", "CO2", "GLUCOSE", "BUN", "CREATININE", "CALCIUM", "MG", "PHOS" in the last 168 hours.  GFR: Estimated Creatinine Clearance: 116.1 mL/min (by C-G formula based on SCr of 0.67 mg/dL). Liver Function Tests: No results for input(s): "AST", "ALT", "ALKPHOS", "BILITOT", "PROT", "ALBUMIN" in the last 168 hours.  No results for input(s): "LIPASE", "AMYLASE" in the last 168 hours. No results for input(s): "AMMONIA" in the last 168 hours. Coagulation Profile: No results for input(s): "INR", "PROTIME" in the last 168 hours. Cardiac Enzymes: No results for input(s): "CKTOTAL", "CKMB", "CKMBINDEX", "TROPONINI" in the last 168 hours. BNP (last 3 results) No results for input(s): "PROBNP" in the last 8760 hours. HbA1C: No results for input(s): "HGBA1C" in the last 72 hours. CBG: No results for input(s): "GLUCAP" in the last 168 hours. Lipid Profile: No results for input(s): "CHOL", "HDL", "LDLCALC", "TRIG", "CHOLHDL", "LDLDIRECT" in the last 72 hours. Thyroid Function Tests: No results for input(s): "TSH", "T4TOTAL", "FREET4", "T3FREE", "THYROIDAB" in the last 72 hours. Anemia Panel: No results for input(s): "VITAMINB12", "FOLATE", "FERRITIN", "TIBC", "IRON", "RETICCTPCT" in the last 72 hours. Sepsis Labs: No results for input(s): "PROCALCITON", "LATICACIDVEN" in the last 168 hours.  No results found for this or any previous visit (from the past 240 hour(s)).   Radiology Studies: No results found.  Scheduled Meds:  cholecalciferol  400 Units Oral Daily   cyclobenzaprine  10 mg Oral QHS   diclofenac Sodium  2 g Topical QID   DULoxetine  60 mg Oral Daily   ferrous sulfate  325 mg Oral BID WC   gabapentin  600 mg Oral TID   metoprolol tartrate  12.5 mg Oral BID   mometasone-formoterol  2 puff Inhalation BID   mupirocin cream   Topical Daily   OLANZapine  15 mg Oral Daily   OLANZapine zydis  5 mg Oral BID AC   pantoprazole  40 mg Oral QAC  breakfast   prazosin  1 mg Oral QHS   sodium bicarbonate  650 mg Oral TID   Continuous Infusions:  sodium chloride Stopped (08/24/22 1347)     LOS: 43 days    Osvaldo Shipper,  [facility]   If 7PM-7AM, please contact night-coverage

## 2022-10-04 NOTE — Care Management Important Message (Signed)
Important Message  Patient Details  Name: [name] MRN: [id number] Date of Birth: 11/08/1990   Medicare Important Message Given:  Yes     Sherilyn Banker 10/04/2022, 12:49 PM

## 2022-10-04 NOTE — Consult Note (Addendum)
[name] Consult Evaluation  Service Date: October 04, 2022 LOS:  LOS: 43 days    Primary Psychiatric Diagnoses   Schizoaffective disorder, bipolar type  GAD  Borderline personality disorder  Assessment  [name] is a 32 y.o. female with a past psychiatric history of borderline personality disorder, schizoaffective disorder bipolar type, and PTSD.  She has had numerous behavioral health admissions and an extensive history of suicide attempts.  Patient presents to [facility] ED for an attempted overdose on her prescription medications.  Briefly, this pt has had ~50 ED visits or psych hospitalizations during 2024; this continues a pattern of an inability to maintain safety in the outpatient setting going back multiple years. The vast majority of her suicide attempts have been via overdose on psychotropic medication. She has failed multiple levels of outpatient management up to and including CST; she has been declined by multiple psychiatric facilities due to futility, and she has been discharged by her CST team as of 8/8. She is currently on the [facility] priority wait list. She is not eligible for ACT services through [facility] due to BPD diagnosis.   We are trying to simplify patient's medication regimen to avoid polypharmacy especially in the setting of her recent overdose. Earlier in hospitalization during a period of improvement (roughly 8/1 to 8/8), we discussed medication management if and when she does return home including closer monitoring, less access to her medications through a locked cabinet, and her ex-boyfriend being more in charge of her medications in which was confirmed with her ex-boyfriend before being dropped by her CST.  After notification of discontinuation from CST services and ineligibility for ACT services, patient had acute worsening of reported hallucinations, suicidality, homicidality and agitation (fear of abandonment is central feature in BPD) and  consideration of community f/u was terminated. Patient has an extensive history of ER visits and hospitalizations in which she has displayed aggression, psychosis, and danger to self even while in proximity of a Recruitment consultant. Patient also shows high level of lethality, intensity, and frequency of suicidal attempts with multiple inpatient psychiatric admissions with little benefit so we continue to recommend [facility]; given wait list will also refer to other psychiatric hospitals. Patient is currently on the [facility] priority wait list and we are working closely with LCSW with bed availability; exploring other state hospital options.   8/29: Patient continues to remain stable and has not been making suicidal, homicidal statements nor has been reporting psychosis for about a week. Patient shows minimal motivation to engage in activities during the day and we continuously encourage her to do take walks and interact with support system. Medication has been helpful in stabilizing patient's mood and behaviors throughout the day and night. *Update: patient was seen later in the afternoon due to periods of agitation. Please see events below. At this time, she was upset but redirectable and understands the consequences of poor behavioral control. Patient will not have phone privileges for 24 hours.  Diagnoses:  Active Hospital problems: Principal Problem:   Bipolar I disorder, current or most recent episode depressed, with psychotic features (HCC) Active Problems:   Asthma   GAD (generalized anxiety disorder)   Suicide attempt (HCC)   Overdose   Drug overdose    Plan   ## Psychiatric Medication Recommendations:  -- Continue Zyprexa to 5 mg po before lunch and supper for mood stabilization during the day (last changed 8/23) -- Continue Zyprexa 15 mg po nightly for psychosis  -- Continue Cymbalta  60 mg daily -- Continue Gabapentin 600 mg TID for agitation, mood/anxiety -- continue prazosin 1 mg at bedtime for  PTSD  Agitation Protocol -- PRN Zyprexa 10 mg IM for agitation -- PRN Ativan 0.5 mg oral BID PRN for agitation -- PRN Atarax 25 mg TID for anxiety, agitation  ## Medical Decision Making Capacity:   Capacity was not formally addressed during this encounter  ## Further Work-up:  -- Per primary -- most recent EKG on 08/30/2022 had QtC of 469  ## Disposition:  -- We recommend inpatient psychiatric hospitalization after medical hospitalization. Patient has been involuntarily committed on 09/04/2022.  -- IVC renewed on 8/28 -- Patient remains on [facility] priority wait list  ## Behavioral / Environmental:  --  To minimize splitting of staff, assign one staff person to communicate all information from the team when feasible. -- Apply the established behavioral rules and protocols consistently throughout hospitalization (see nursing care order for details). Ensure that all staff members are aware of and adhere to the same guidelines to avoid confusion and mixed messages.  ## Safety and Observation Level:  - Based on my clinical evaluation, I estimate the patient to be at elevated risk of self harm in the current setting - At this time, we recommend continuing a 1:1 level of observation. This decision is based on my review of the chart including patient's history and current presentation, interview of the patient, mental status examination, and consideration of suicide risk including evaluating suicidal ideation, plan, intent, suicidal or self-harm behaviors, risk factors, and protective factors. This judgment is based on our ability to directly address suicide risk, implement suicide prevention strategies and develop a safety plan while the patient is in the clinical setting. Please contact our team if there is a concern that risk level has changed.  Suicide risk assessment  Patient has following modifiable risk factors for suicide: active suicidal ideation, under treated depression , recklessness, and  lack of access to outpatient mental health resources, which we are addressing by continuing adequate safety precautions while she is hospitalized and adjusting medications   Patient has following non-modifiable or demographic risk factors for suicide: early widowhood, history of suicide attempt, history of self harm behavior, and psychiatric hospitalization  Patient has the following protective factors against suicide: Supportive friends  Thank you for this consult request. Recommendations have been communicated to the primary team.  We will follow at this time.    Lance Muss, MD   Total Time Spent in Direct Patient Care:  I personally spent 60 minutes on the unit in direct patient care. The direct patient care time included face-to-face time with the patient, reviewing the patient's chart, communicating with other professionals, and coordinating care. Greater than 50% of this time was spent in counseling or coordinating care with the patient regarding goals of hospitalization, psycho-education, and discharge planning needs.    Psychiatric and Social History   Relevant Aspects of Hospital Course:  Admitted on 08/22/2022 for suicide attempt by overdose on her prescription medication.   Patient Report:  Patient seen this morning. She notes feeling alright. She reports good sleep and appetite. She continues to deny SI, HI, and AVH. She is unable to state what has helped her not have thoughts of SI and HI. Patient denied going on walks yesterday. I encouraged the patient to take walks today and she said "I do not really want to". When I asked what are other activities she can engage in, she said "nothing". She  denied working on Information systems manager. She feels the medications have been helpful keeping her mood stable throughout the day.  Update- nursing messaged team at 12:42 PM with concerns that patient was hallucinating and becoming violent. PRN ativan given at 12:49.  I went to see the patient shortly after and patient was calm and able to respond to questions appropriately. She reported frustrations about her parents trying to control her life. At 1:54 PM, nurse messaged and stated four point restraints were added due to "pt is violent and pulling things off the wall and is not redirectable and is threatening staff ". Attending psychiatrist Dr. Viviano Simas and I went back to assess the patient. Patient states that she became frustrated when she spoke with her friend Fayrene Fearing on the phone and said he was going to throw away her belongings. Patient showed [name] the list of coping skills she can use when he becomes upset. We also discussed thinking prior to lashing out and observing physical signs to be aware of when he becomes more agitated. She agrees to use this technique. Patient was tearful, remained calm during our assessment and acknowledges that throwing objects at the staff is not a good coping skill. We discussed with the nurse to remove one restraint every 30 minutes if patient remains calm and appropriate.   Collateral 8/29: Spoke to Fayrene Fearing and he stated that he visited [place] earlier this week in which she asked him to throw away her books because she was going to [facility].  8/14: called Fayrene Fearing, no response 8/12: tried to call Fayrene Fearing with no response.  8/9: called Debra with CST - stated she would be happy to conduct a termination session with Cariah if she reached out . She has not been called by [name] during this hospitalization and Alliana has refused offered termination session.  8/9: called Dr. Pershing Proud with [facility] to discuss pt's recent decompensation  8/5: spoke with Fayrene Fearing  [employer] in to note above   Past Psychiatric History: Previous Psych Diagnoses: Schizoaffective disorder bipolar type, Anxiety, Autism spectrum disorder, PTSD, Borderline personality disorder Prior inpatient treatment: multiple, 15 ED visits and 4 inpatient admissions since 06/2022 (~50  total in 2024), admitted to [facility] from 07/29/22-08/02/2022, has been to [facility] for eight months in 2016 which led to period of stability, and was discharged from inpatient facility within a week of her OD Current/prior outpatient treatment: [facility], CST team twice a week Prior rehab hx: denies multiple Psychotherapy hx: [facility] History of suicide: 20-30 times by overdosing on pills, cutting or drinking 409 disinfectant History of homicide or aggression: denies Psychiatric medication history:  Previously trials of Tegretol, Haldol, Clozaril, Depakote, Invega, Lamictal, Seroquel, Asenapine, Abilify, vraylar (worsened suicidal thoughts), Lexapro, Zoloft  Neuromodulation history: denies  Social History:  Marital status: Widowed Children: 69 year old daughter Lives: with ex-boyfriend Fayrene Fearing, in two story home Employment: Disability, 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  Access to weapons: None   Exam Findings   Psychiatric Specialty Exam:  Presentation  General Appearance: Appropriate for Environment (in hospital gown)  Eye Contact:Fair  Speech:Clear and Coherent  Speech Volume:Normal  Handedness:Right   Mood and Affect  Mood:-- Dysphoric  Affect:-- Congruent    Thought Process  Thought Processes:Coherent  Descriptions of Associations:Intact  Orientation:Full (Time, Place and Person)  Thought Content:Logical  History of Schizophrenia/Schizoaffective disorder: Yes  Duration of Psychotic Symptoms:Greater than six months  Hallucinations: Denies  Ideas of Reference:None  Suicidal Thoughts: Denies  Homicidal Thoughts: Denies   Sensorium  Memory:Immediate Good; Recent Good; Remote Good  Judgment:Impaired  Insight: Chief Executive Officer  Concentration:Fair  Attention Span:Fair  Recall:Fair  [employer] of Knowledge:Fair  Language:Fair   Psychomotor Activity  Psychomotor Activity:  Normal  Assets  Assets:Communication Skills; Resilience   Sleep  Sleep: Fair    Physical Exam: Vital signs:  Temp:  [97.9 F (36.6 C)-98.5 F (36.9 C)] 97.9 F (36.6 C) (08/29 0918) Pulse Rate:  [85-109] 109 (08/29 0918) Resp:  [18-20] 18 (08/29 0918) BP: (97-142)/(66-77) 142/77 (08/29 0918) SpO2:  [98 %-100 %] 98 % (08/29 3086) Physical Exam Vitals and nursing note reviewed. Exam conducted with a chaperone present.  Constitutional:      General: She is not in acute distress.    Appearance: She is obese. She is not ill-appearing.  HENT:     Head: Normocephalic.  Cardiovascular:     Rate and Rhythm: Normal rate.  Pulmonary:     Effort: Pulmonary effort is normal. No respiratory distress.  Neurological:     General: No focal deficit present.     Mental Status: She is alert.  Psychiatric:        Attention and Perception: Attention normal.        Speech: Speech normal.     Blood pressure (!) 142/77, pulse (!) 109, temperature 97.9 F (36.6 C), temperature source Oral, resp. rate 18, height 5\' 5"  (1.651 m), weight 98.2 kg, last menstrual period 08/08/2022, SpO2 98%. Body mass index is 36.03 kg/m.   Other History   These have been pulled in through the EMR, reviewed, and updated if appropriate.   Family History:  The patient's family history includes Asthma in her father; Mental illness in her father; PDD in her brother; Seizures in her brother.  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    Chronic constipation    Depression    Drug-seeking behavior    Essential tremor    Headache    Ineffective individual coping 05/16/2022    Insomnia 01/12/2022   Intentional drug overdose (HCC) 06/05/2022   Neuromuscular disorder (HCC)    Normocytic anemia 06/05/2022   Overdose 07/22/2017   Overdose of acetaminophen 07/2017   and other meds   Overdose, intentional self-harm, initial encounter (HCC) 07/20/2021   Paranoia (HCC) 04/22/2021   Personality disorder (HCC)    Purposeful non-suicidal drug ingestion (HCC) 06/27/2021   Schizo-affective psychosis (HCC)    Schizoaffective disorder (HCC) 07/29/2022   Schizoaffective disorder, bipolar type (HCC)    Seizures (HCC)    Last seizure December 2017   Skin erythema 04/27/2022   Sleep apnea    Suicidal behavior 07/25/2021   Suicidal ideation    Suicide (HCC) 07/01/2021   Suicide attempt (HCC) 07/04/2021    Surgical History: Past Surgical History:  Procedure Laterality Date   MOUTH SURGERY  2009 or 2010    Medications:   Current Facility-Administered Medications:    0.9 %  sodium chloride infusion, 250 mL, Intravenous, Continuous, Almon Hercules, MD, Stopped at 08/24/22 1347   albuterol (  PROVENTIL) (2.5 MG/3ML) 0.083% nebulizer solution 3 mL, 3 mL, Inhalation, Q6H PRN, Gonfa, Taye T, MD   cholecalciferol (VITAMIN D3) 10 MCG (400 UNIT) tablet 400 Units, 400 Units, Oral, Daily, Cinderella, Margaret A, 400 Units at 10/03/22 1884   cyclobenzaprine (FLEXERIL) tablet 10 mg, 10 mg, Oral, QHS, Gonfa, Taye T, MD, 10 mg at 10/03/22 2117   diclofenac Sodium (VOLTAREN) 1 % topical gel 2 g, 2 g, Topical, QID, Gonfa, Taye T, MD, 2 g at 10/03/22 2118   DULoxetine (CYMBALTA) DR capsule 60 mg, 60 mg, Oral, Daily, Akintayo, Mojeed, MD, 60 mg at 10/03/22 0902   ferrous sulfate tablet 325 mg, 325 mg, Oral, BID WC, Gonfa, Taye T, MD, 325 mg at 10/03/22 1631   gabapentin (NEURONTIN) capsule 600 mg, 600 mg, Oral, TID, Alanda Slim, Taye T, MD, 600 mg at 10/03/22 2117   hydrOXYzine (ATARAX) tablet 25 mg, 25 mg, Oral, TID PRN, Candelaria Stagers T, MD, 25 mg at 10/01/22 2228   ibuprofen (ADVIL) tablet 600  mg, 600 mg, Oral, TID PRN, Candelaria Stagers T, MD, 600 mg at 10/03/22 1630   LORazepam (ATIVAN) tablet 0.5 mg, 0.5 mg, Oral, BID PRN, Alanda Slim, Taye T, MD, 0.5 mg at 10/01/22 1301   metoprolol tartrate (LOPRESSOR) tablet 12.5 mg, 12.5 mg, Oral, BID, Vann, Jessica U, DO, 12.5 mg at 10/03/22 2116   mometasone-formoterol (DULERA) 200-5 MCG/ACT inhaler 2 puff, 2 puff, Inhalation, BID, Alanda Slim, Taye T, MD, 2 puff at 10/04/22 0809   mupirocin cream (BACTROBAN) 2 %, , Topical, Daily, Marguerita Merles [place], DO, Given at 09/27/22 0859   OLANZapine (ZYPREXA) injection 10 mg, 10 mg, Intramuscular, Once PRN, Akintayo, Mojeed, MD   OLANZapine (ZYPREXA) tablet 15 mg, 15 mg, Oral, Daily, Cinderella, Margaret A, 15 mg at 10/03/22 2115   OLANZapine zydis (ZYPREXA) disintegrating tablet 5 mg, 5 mg, Oral, BID AC, Mariel Craft, MD, 5 mg at 10/03/22 1623   ondansetron (ZOFRAN-ODT) disintegrating tablet 4 mg, 4 mg, Oral, Q8H PRN, Sheikh, Omair Latif, DO, 4 mg at 09/17/22 1700   Oral care mouth rinse, 15 mL, Mouth Rinse, PRN, Osvaldo Shipper, MD   pantoprazole (PROTONIX) EC tablet 40 mg, 40 mg, Oral, QAC breakfast, Gonfa, Taye T, MD, 40 mg at 10/03/22 0830   polyethylene glycol (MIRALAX / GLYCOLAX) packet 17 g, 17 g, Oral, BID PRN, Alanda Slim, Taye T, MD, 17 g at 10/03/22 1624   prazosin (MINIPRESS) capsule 1 mg, 1 mg, Oral, QHS, Gonfa, Taye T, MD, 1 mg at 10/03/22 2117   senna-docusate (Senokot-S) tablet 1 tablet, 1 tablet, Oral, BID PRN, Candelaria Stagers T, MD, 1 tablet at 10/03/22 1624   sodium bicarbonate tablet 650 mg, 650 mg, Oral, TID, Candelaria Stagers T, MD, 650 mg at 10/03/22 2115  Allergies: Allergies  Allergen Reactions   Bee Venom Anaphylaxis   Coconut Flavor Anaphylaxis and Rash   Fish Allergy Anaphylaxis   Geodon [Ziprasidone Hcl] Other (See Comments)    Pt states that this medication causes paralysis of the mouth.     Haloperidol And Related Other (See Comments)    Pt states that this medication causes paralysis of the  mouth, jaw locks up   Lithobid [Lithium] Other (See Comments)    Seizure-like activity    Roxicodone [Oxycodone] Other (See Comments)    Hallucinations    Seroquel [Quetiapine] Other (See Comments)    Severe drowsiness - Pt currently taking 12.5 mg BID, she is ok with this dose   Shellfish Allergy Anaphylaxis   Phenergan [  Promethazine Hcl] Other (See Comments)    Chest pain     Prilosec [Omeprazole] Nausea And Vomiting and Other (See Comments)    Pt can take protonix with no problems    Sulfa Antibiotics Other (See Comments)    Chest pain    Tegretol [Carbamazepine] Nausea And Vomiting   Prozac [Fluoxetine] Other (See Comments)    Increased Depression and Suicidal thoughts   Tape Other (See Comments)    Skin tears, can only tolerate paper tape.   Tylenol [Acetaminophen] Rash and Other (See Comments)    Rash on face

## 2022-10-05 DIAGNOSIS — F315 Bipolar disorder, current episode depressed, severe, with psychotic features: Secondary | ICD-10-CM | POA: Diagnosis not present

## 2022-10-05 NOTE — Progress Notes (Addendum)
PROGRESS NOTE    [name]  XBM:[id number] DOB: 03-28-1990 DOA: 08/22/2022 PCP: [facility]    Brief Narrative:  This 32 yrs old female with PMH significant of adjustment disorder with mixed anxiety and depressed mood, autism, bipolar 1 disorder, fragile x syndrome, seizures not on meds, schizoaffective disorder and multiple suicide attempts in the last year presented to the ED after ingesting numerous medications in a suicide attempt. She reported taking #30 metoprolol 25mg  tablets, #60 Hydroxyzine 25mg , #7 Lexapro 20mg , #21 Gabapentin 600mg , #7 Flexeril 10mg , #7 protonix 40mg  and #7 Prazosin 1mg . She was slightly bradycardic and hypotensive on EMS arrival which improved with fluids. Poison control was contacted and as she was within 2hrs of ingestion was given activated charcoal. Patient was placed under IVC in ED. Poison control also recommended ICU admission for close Qtc and electrolyte monitoring and possible pressors. She was intubated and started on vasopressor and glucagon. Also started on ceftriaxone for possible pneumonia. Patient was extubated on 7/18 and transferred to the floor on 7/19. She was transferred to [facility] service on 08/25/22. Psychiatry continues to follow. They recommend inpatient psychiatric hospitalization. Awaiting placement at [facility]    Assessment & Plan:  Suicidal Attempt with intentional overdose:  She presented after having taken multiple medications including # 30 metoprolol 25mg  tablets, # 60 Hydroxyzine 25mg , # 7 Lexapro 20mg , # 21 Gabapentin 600mg , # 7 Flexeril 10mg , # 7 protonix 40mg  and # 7 Prazosin 1mg .  Poison control was contacted.   Received charcoal in the ED.  Initially required vasopressor,  glucagon,  intubation and mechanical ventilation.   Currently on IVC.  Referral was made to APS by psychiatry.   Further care as per psychiatry.   Yesterday afternoon she got a bit agitated.  Was given Ativan with which she calmed down.  During this  time she also mention chest pain.  EKG was done which did not show any new changes compared to previous EKG.  Likely driven by her anxiety.  Denies any chest pain this morning.  Lying comfortably on the bed.  No further workup at this time.  She has had previous similar episodes with the negative troponin levels.   Anxiety and Depression, Schizoaffective Disorder, Adjustment Disorder, bipolar disorder, fibromyalgia and auditory hallucination:  Meds per psych.   Community-acquired pneumonia: Completed antibiotic treatment for community-acquired pneumonia.   Essential hypertension: On metoprolol and prazosin.  Blood pressure is reasonably well-controlled.   Sinus Tachycardia: Continue Lopressor.   -d dimer normal -troponin normal   Metabolic Acidosis: -resolved   Hypomagnesemia: -repleted   Microcytic Anemia: Iron deficiency anemia.  Low ferritin.  Continue oral iron.   History of Seizure Disorder: -Stable.  Not on meds.   History of Asthma: Continue nebulizers.  Stable.   Dysuria: She was  catheterized during ICU.   - recently treated with antibiotics.   Hypoalbuminemia: Encourage oral intake.   Obesity: Body mass index is 36.03 kg/m.  Would benefit from weight loss as outpatient.  Patient refused labs on 8/29.  DVT prophylaxis: SCDs Code Status: Full code Family Communication:No family at bed side. Disposition Plan: per psych and TOC    Consultants:  Psychiatry   Subjective: No complaints offered this morning.  She does not want to talk about yesterday's events.  Denies Chest pain.  Objective: Vitals:   10/04/22 1517 10/04/22 1940 10/04/22 2022 10/05/22 0946  BP: 117/84  134/72   Pulse: (!) 110  96   Resp: 18  17 18  Temp: 98.5 F (36.9 C)  98.2 F (36.8 C)   TempSrc: Oral  Oral   SpO2: 99% 98% 98%   Weight:      Height:        Intake/Output Summary (Last 24 hours) at 10/05/2022 1049 Last data filed at 10/05/2022 0830 Gross per 24 hour  Intake  1316 ml  Output --  Net 1316 ml    Filed Weights   09/01/22 0300 09/02/22 0500 09/03/22 0402  Weight: 98.8 kg 99 kg 98.2 kg    Examination:  General appearance: Awake alert.  In no distress Resp: Clear to auscultation bilaterally.  Normal effort Cardio: S1-S2 is normal regular.  No S3-S4.  No rubs murmurs or bruit GI: Abdomen is soft.  Nontender nondistended.  Bowel sounds are present normal.  No masses organomegaly      Data Reviewed: I have personally reviewed following labs and imaging studies  CBC: No results for input(s): "WBC", "NEUTROABS", "HGB", "HCT", "MCV", "PLT" in the last 168 hours.  Basic Metabolic Panel: No results for input(s): "NA", "K", "CL", "CO2", "GLUCOSE", "BUN", "CREATININE", "CALCIUM", "MG", "PHOS" in the last 168 hours.  GFR: Estimated Creatinine Clearance: 116.1 mL/min (by C-G formula based on SCr of 0.67 mg/dL). Liver Function Tests: No results for input(s): "AST", "ALT", "ALKPHOS", "BILITOT", "PROT", "ALBUMIN" in the last 168 hours.  No results for input(s): "LIPASE", "AMYLASE" in the last 168 hours. No results for input(s): "AMMONIA" in the last 168 hours. Coagulation Profile: No results for input(s): "INR", "PROTIME" in the last 168 hours. Cardiac Enzymes: No results for input(s): "CKTOTAL", "CKMB", "CKMBINDEX", "TROPONINI" in the last 168 hours. BNP (last 3 results) No results for input(s): "PROBNP" in the last 8760 hours. HbA1C: No results for input(s): "HGBA1C" in the last 72 hours. CBG: No results for input(s): "GLUCAP" in the last 168 hours. Lipid Profile: No results for input(s): "CHOL", "HDL", "LDLCALC", "TRIG", "CHOLHDL", "LDLDIRECT" in the last 72 hours. Thyroid Function Tests: No results for input(s): "TSH", "T4TOTAL", "FREET4", "T3FREE", "THYROIDAB" in the last 72 hours. Anemia Panel: No results for input(s): "VITAMINB12", "FOLATE", "FERRITIN", "TIBC", "IRON", "RETICCTPCT" in the last 72 hours. Sepsis Labs: No results for  input(s): "PROCALCITON", "LATICACIDVEN" in the last 168 hours.  No results found for this or any previous visit (from the past 240 hour(s)).   Radiology Studies: No results found.  Scheduled Meds:  cholecalciferol  400 Units Oral Daily   cyclobenzaprine  10 mg Oral QHS   diclofenac Sodium  2 g Topical QID   DULoxetine  60 mg Oral Daily   ferrous sulfate  325 mg Oral BID WC   gabapentin  600 mg Oral TID   metoprolol tartrate  12.5 mg Oral BID   mometasone-formoterol  2 puff Inhalation BID   mupirocin cream   Topical Daily   OLANZapine  15 mg Oral Daily   OLANZapine zydis  5 mg Oral BID AC   pantoprazole  40 mg Oral QAC breakfast   prazosin  1 mg Oral QHS   sodium bicarbonate  650 mg Oral TID   Continuous Infusions:  sodium chloride Stopped (08/24/22 1347)     LOS: 44 days    Osvaldo Shipper,  [facility]   If 7PM-7AM, please contact night-coverage

## 2022-10-05 NOTE — Plan of Care (Signed)
  Problem: Education: Goal: Knowledge of General Education information will improve Description Including pain rating scale, medication(s)/side effects and non-pharmacologic comfort measures Outcome: Progressing   Problem: Health Behavior/Discharge Planning: Goal: Ability to manage health-related needs will improve Outcome: Progressing   

## 2022-10-05 NOTE — Progress Notes (Signed)
no restraints, calm and cooperative throughout the night

## 2022-10-05 NOTE — Progress Notes (Signed)
Patient refused lab draws x 2 today.  Madeline Bebout, Kae Heller, RN

## 2022-10-05 NOTE — Consult Note (Signed)
[name] Consult Evaluation  Service Date: October 05, 2022 LOS:  LOS: 44 days    Primary Psychiatric Diagnoses   Schizoaffective disorder, bipolar type  GAD  Borderline personality disorder  Assessment  [name] is a 32 y.o. female with a past psychiatric history of borderline personality disorder, schizoaffective disorder bipolar type, and PTSD.  She has had numerous behavioral health admissions and an extensive history of suicide attempts.  Patient presents to [facility] ED for an attempted overdose on her prescription medications.  Briefly, this pt has had ~50 ED visits or psych hospitalizations during 2024; this continues a pattern of an inability to maintain safety in the outpatient setting going back multiple years. The vast majority of her suicide attempts have been via overdose on psychotropic medication. She has failed multiple levels of outpatient management up to and including CST; she has been declined by multiple psychiatric facilities due to futility, and she has been discharged by her CST team as of 8/8. She is currently on the [facility] priority wait list. She is not eligible for ACT services through [facility] due to BPD diagnosis.   We are trying to simplify patient's medication regimen to avoid polypharmacy especially in the setting of her recent overdose. Earlier in hospitalization during a period of improvement (roughly 8/1 to 8/8), we discussed medication management if and when she does return home including closer monitoring, less access to her medications through a locked cabinet, and her ex-boyfriend being more in charge of her medications in which was confirmed with her ex-boyfriend before being dropped by her CST.  After notification of discontinuation from CST services and ineligibility for ACT services, patient had acute worsening of reported hallucinations, suicidality, homicidality and agitation (fear of abandonment is central feature in BPD) and  consideration of community f/u was terminated. Patient has an extensive history of ER visits and hospitalizations in which she has displayed aggression, psychosis, and danger to self even while in proximity of a Recruitment consultant. Patient also shows high level of lethality, intensity, and frequency of suicidal attempts with multiple inpatient psychiatric admissions with little benefit so we continue to recommend [facility]; given wait list will also refer to other psychiatric hospitals. Patient is currently on the [facility] priority wait list and we are working closely with LCSW with bed availability; exploring other state hospital options.   8/30: Patient doing well this morning, denying SI, HI, and AVH. She gives insight on the coping strategies we discussed with her yesterday including being self aware of physical symptoms like clenching fists connected with her emotions and tell the staff. She practiced this last night and asked for ativan and atarax. She understands that there are consequences to behavioral outbursts.   Diagnoses:  Active Hospital problems: Principal Problem:   Bipolar I disorder, current or most recent episode depressed, with psychotic features (HCC) Active Problems:   Asthma   GAD (generalized anxiety disorder)   Suicide attempt (HCC)   Overdose   Drug overdose    Plan   ## Psychiatric Medication Recommendations:  -- Continue Zyprexa to 5 mg po before lunch and supper for mood stabilization during the day (last changed 8/23) -- Continue Zyprexa 15 mg po nightly for psychosis  -- Continue Cymbalta 60 mg daily -- Continue Gabapentin 600 mg TID for agitation, mood/anxiety -- Continue prazosin 1 mg at bedtime for PTSD  Agitation Protocol -- PRN Zyprexa 10 mg IM for agitation -- PRN Ativan 0.5 mg oral BID PRN for agitation --  PRN Atarax 25 mg TID for anxiety, agitation  ## Medical Decision Making Capacity:   Capacity was not formally addressed during this encounter  ## Further  Work-up:  -- Per primary  -- most recent EKG on 09/03/2022 had QtC of 470 EKG reading: Sinus tachycardia Inferior infarct (cited on or before 04-Oct-2022) Anterolateral infarct , age undetermined Abnormal ECG When compared with ECG of 26-Sep-2022 11:59, Anterior infarct is now Present Anterolateral infarct is now Present Nonspecific T wave abnormality no longer evident in Anterior leads I informed attending medical physician of the recent EKG. I ordered a troponin and d dimer.  ## Disposition:  -- We recommend inpatient psychiatric hospitalization after medical hospitalization. Patient has been involuntarily committed on 09/04/2022.  -- IVC renewed on 8/28 -- Patient remains on [facility] priority wait list  ## Behavioral / Environmental:  --  To minimize splitting of staff, assign one staff person to communicate all information from the team when feasible. -- Apply the established behavioral rules and protocols consistently throughout hospitalization (see nursing care order for details). Ensure that all staff members are aware of and adhere to the same guidelines to avoid confusion and mixed messages.  ## Safety and Observation Level:  - Based on my clinical evaluation, I estimate the patient to be at elevated risk of self harm in the current setting - At this time, we recommend continuing a 1:1 level of observation. This decision is based on my review of the chart including patient's history and current presentation, interview of the patient, mental status examination, and consideration of suicide risk including evaluating suicidal ideation, plan, intent, suicidal or self-harm behaviors, risk factors, and protective factors. This judgment is based on our ability to directly address suicide risk, implement suicide prevention strategies and develop a safety plan while the patient is in the clinical setting. Please contact our team if there is a concern that risk level has changed.  Suicide risk  assessment  Patient has following modifiable risk factors for suicide: active suicidal ideation, under treated depression , recklessness, and lack of access to outpatient mental health resources, which we are addressing by continuing adequate safety precautions while she is hospitalized and adjusting medications   Patient has following non-modifiable or demographic risk factors for suicide: early widowhood, history of suicide attempt, history of self harm behavior, and psychiatric hospitalization  Patient has the following protective factors against suicide: Supportive friends  Thank you for this consult request. Recommendations have been communicated to the primary team.  We will follow at this time.    Lance Muss, MD   Total Time Spent in Direct Patient Care:  I personally spent 30 minutes on the unit in direct patient care. The direct patient care time included face-to-face time with the patient, reviewing the patient's chart, communicating with other professionals, and coordinating care. Greater than 50% of this time was spent in counseling or coordinating care with the patient regarding goals of hospitalization, psycho-education, and discharge planning needs.    Psychiatric and Social History   Relevant Aspects of Hospital Course:  Admitted on 08/22/2022 for suicide attempt by overdose on her prescription medication.   Patient Report:  No overnight events noted. Per nursing note, no restraints, calm and cooperative throughout the night.   PRN meds given overnight: atarax 1x, ativan 1x  Patient seen this morning. She reports feeling down but better compared to yesterday. She stated feeling agitated last night but did not have an outburst and instead asked for PRN medications.  She reports good sleep and appetite. She denies SI, HI, and AVH. She denies chest pain and numbness to her arm. I encouraged the patient to go on at least one walk today to stay mobile. I discussed how  patient is still on the [facility] waitlist and working on disposition planning.  Collateral 8/29: Spoke to Fayrene Fearing and he stated that he visited Joy earlier this week in which she asked him to throw away her books because she was going to [facility].  8/14: called Fayrene Fearing, no response 8/12: tried to call Fayrene Fearing with no response.  8/9: called Debra with CST - stated she would be happy to conduct a termination session with Azilda if she reached out . She has not been called by [name] during this hospitalization and Makenzee has refused offered termination session.  8/9: called Dr. Pershing Proud with [facility] to discuss pt's recent decompensation  8/5: spoke with Fayrene Fearing  [facility] [employer] in to note above   Past Psychiatric History: Previous Psych Diagnoses: Schizoaffective disorder bipolar type, Anxiety, Autism spectrum disorder, PTSD, Borderline personality disorder Prior inpatient treatment: multiple, 15 ED visits and 4 inpatient admissions since 06/2022 (~50 total in 2024), admitted to [facility] from 07/29/22-08/02/2022, has been to [facility] for eight months in 2016 which led to period of stability, and was discharged from inpatient facility within a week of her OD Current/prior outpatient treatment: [facility], CST team twice a week Prior rehab hx: denies multiple Psychotherapy hx: [facility] History of suicide: 20-30 times by overdosing on pills, cutting or drinking 409 disinfectant History of homicide or aggression: denies Psychiatric medication history:  Previously trials of Tegretol, Haldol, Clozaril, Depakote, Invega, Lamictal, Seroquel, Asenapine, Abilify, vraylar (worsened suicidal thoughts), Lexapro, Zoloft  Neuromodulation history: denies  Social History:  Marital status: Widowed Children: 75 year old daughter Lives: with ex-boyfriend Fayrene Fearing, in two story home Employment: Disability, 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  Access to weapons:  None   Exam Findings   Psychiatric Specialty Exam:  Presentation  General Appearance: Appropriate for Environment  Eye Contact:Fair  Speech:Clear and Coherent  Speech Volume:Normal  Handedness:Right   Mood and Affect  Mood:-- Depressed  Affect:-- Congruent     Thought Process  Thought Processes:Coherent  Descriptions of Associations:Intact  Orientation:Full (Time, Place and Person)  Thought Content:Logical  History of Schizophrenia/Schizoaffective disorder: Yes  Duration of Psychotic Symptoms:Greater than six months  Hallucinations: Denies  Ideas of Reference:None  Suicidal Thoughts: Denies  Homicidal Thoughts: Denies   Sensorium  Memory:Immediate Good; Recent Good; Remote Good  Judgment:Impaired  Insight: Fair   Art therapist  Concentration:Good  Attention Span:Good  Recall:Fair  Fund of Knowledge:Fair  Language:Fair   Psychomotor Activity  Psychomotor Activity: Normal  Assets  Assets:Desire for Improvement; Resilience; Communication Skills; Housing   Sleep  Sleep: Fair    Physical Exam: Vital signs:  Temp:  [98.2 F (36.8 C)-98.5 F (36.9 C)] 98.2 F (36.8 C) (08/29 2022) Pulse Rate:  [96-110] 96 (08/29 2022) Resp:  [17-18] 18 (08/30 0946) BP: (117-134)/(72-84) 134/72 (08/29 2022) SpO2:  [98 %-99 %] 98 % (08/29 2022) Physical Exam Vitals and nursing note reviewed. Exam conducted with a chaperone present.  Constitutional:      General: She is not in acute distress.    Appearance: She is obese. She is not ill-appearing.  HENT:     Head: Normocephalic.  Cardiovascular:     Rate and Rhythm: Normal rate.  Pulmonary:  Effort: Pulmonary effort is normal. No respiratory distress.  Neurological:     General: No focal deficit present.     Mental Status: She is alert.  Psychiatric:        Attention and Perception: Attention normal.        Speech: Speech normal.     Blood pressure 134/72, pulse 96, temperature  98.2 F (36.8 C), temperature source Oral, resp. rate 18, height 5\' 5"  (1.651 m), weight 98.2 kg, last menstrual period 08/08/2022, SpO2 98%. Body mass index is 36.03 kg/m.   Other History   These have been pulled in through the EMR, reviewed, and updated if appropriate.   Family History:  The patient's family history includes Asthma in her father; Mental illness in her father; PDD in her brother; Seizures in her brother.  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    Chronic constipation    Depression    Drug-seeking behavior    Essential tremor    Headache    Ineffective individual coping 05/16/2022   Insomnia 01/12/2022   Intentional drug overdose (HCC) 06/05/2022   Neuromuscular disorder (HCC)    Normocytic anemia 06/05/2022   Overdose 07/22/2017   Overdose of acetaminophen 07/2017   and other meds   Overdose, intentional self-harm, initial encounter (HCC) 07/20/2021   Paranoia (HCC) 04/22/2021   Personality disorder (HCC)    Purposeful non-suicidal drug ingestion (HCC) 06/27/2021   Schizo-affective psychosis (HCC)    Schizoaffective disorder (HCC) 07/29/2022   Schizoaffective disorder, bipolar type (HCC)    Seizures (HCC)    Last seizure December 2017   Skin erythema 04/27/2022   Sleep apnea    Suicidal behavior 07/25/2021   Suicidal ideation    Suicide (HCC) 07/01/2021   Suicide attempt (HCC) 07/04/2021    Surgical History: Past Surgical History:  Procedure Laterality Date   MOUTH SURGERY  2009 or 2010    Medications:   Current Facility-Administered Medications:    0.9 %  sodium chloride infusion, 250 mL, Intravenous,  Continuous, Gonfa, Taye T, MD, Stopped at 08/24/22 1347   albuterol (PROVENTIL) (2.5 MG/3ML) 0.083% nebulizer solution 3 mL, 3 mL, Inhalation, Q6H PRN, Gonfa, Taye T, MD   cholecalciferol (VITAMIN D3) 10 MCG (400 UNIT) tablet 400 Units, 400 Units, Oral, Daily, Cinderella, Margaret A, 400 Units at 10/05/22 0949   cyclobenzaprine (FLEXERIL) tablet 10 mg, 10 mg, Oral, QHS, Gonfa, Taye T, MD, 10 mg at 10/04/22 2057   diclofenac Sodium (VOLTAREN) 1 % topical gel 2 g, 2 g, Topical, QID, Gonfa, Taye T, MD, 2 g at 10/05/22 0950   DULoxetine (CYMBALTA) DR capsule 60 mg, 60 mg, Oral, Daily, Akintayo, Mojeed, MD, 60 mg at 10/05/22 0949   ferrous sulfate tablet 325 mg, 325 mg, Oral, BID WC, Gonfa, Taye T, MD, 325 mg at 10/05/22 0949   gabapentin (NEURONTIN) capsule 600 mg, 600 mg, Oral, TID, Alanda Slim, Taye T, MD, 600 mg at 10/05/22 0949   hydrOXYzine (ATARAX) tablet 25 mg, 25 mg, Oral, TID PRN, Candelaria Stagers T, MD, 25 mg at 10/04/22 2022   ibuprofen (ADVIL) tablet 600 mg, 600 mg, Oral, TID  PRN, Candelaria Stagers T, MD, 600 mg at 10/04/22 1753   LORazepam (ATIVAN) tablet 0.5 mg, 0.5 mg, Oral, BID PRN, Alanda Slim, Taye T, MD, 0.5 mg at 10/04/22 2307   metoprolol tartrate (LOPRESSOR) tablet 12.5 mg, 12.5 mg, Oral, BID, Vann, Jessica U, DO, 12.5 mg at 10/05/22 0949   mometasone-formoterol (DULERA) 200-5 MCG/ACT inhaler 2 puff, 2 puff, Inhalation, BID, Alanda Slim, Taye T, MD, 2 puff at 10/05/22 0946   mupirocin cream (BACTROBAN) 2 %, , Topical, Daily, Marguerita Merles Kahite, DO, Given at 09/27/22 0859   OLANZapine (ZYPREXA) injection 10 mg, 10 mg, Intramuscular, Daily PRN, Kizzie Ide B, MD   OLANZapine (ZYPREXA) tablet 15 mg, 15 mg, Oral, Daily, Cinderella, Margaret A, 15 mg at 10/04/22 2058   OLANZapine zydis (ZYPREXA) disintegrating tablet 5 mg, 5 mg, Oral, BID AC, Mariel Craft, MD, 5 mg at 10/04/22 1638   ondansetron (ZOFRAN-ODT) disintegrating tablet 4 mg, 4 mg, Oral, Q8H PRN, Sheikh, Omair Latif, DO, 4 mg at 10/04/22 1638    Oral care mouth rinse, 15 mL, Mouth Rinse, PRN, Osvaldo Shipper, MD   pantoprazole (PROTONIX) EC tablet 40 mg, 40 mg, Oral, QAC breakfast, Gonfa, Taye T, MD, 40 mg at 10/05/22 0949   polyethylene glycol (MIRALAX / GLYCOLAX) packet 17 g, 17 g, Oral, BID PRN, Alanda Slim, Taye T, MD, 17 g at 10/04/22 1028   prazosin (MINIPRESS) capsule 1 mg, 1 mg, Oral, QHS, Gonfa, Taye T, MD, 1 mg at 10/04/22 2022   senna-docusate (Senokot-S) tablet 1 tablet, 1 tablet, Oral, BID PRN, Candelaria Stagers T, MD, 1 tablet at 10/04/22 1028   sodium bicarbonate tablet 650 mg, 650 mg, Oral, TID, Candelaria Stagers T, MD, 650 mg at 10/05/22 0949  Allergies: Allergies  Allergen Reactions   Bee Venom Anaphylaxis   Coconut Flavor Anaphylaxis and Rash   Fish Allergy Anaphylaxis   Geodon [Ziprasidone Hcl] Other (See Comments)    Pt states that this medication causes paralysis of the mouth.     Haloperidol And Related Other (See Comments)    Pt states that this medication causes paralysis of the mouth, jaw locks up   Lithobid [Lithium] Other (See Comments)    Seizure-like activity    Roxicodone [Oxycodone] Other (See Comments)    Hallucinations    Seroquel [Quetiapine] Other (See Comments)    Severe drowsiness - Pt currently taking 12.5 mg BID, she is ok with this dose   Shellfish Allergy Anaphylaxis   Phenergan [Promethazine Hcl] Other (See Comments)    Chest pain     Prilosec [Omeprazole] Nausea And Vomiting and Other (See Comments)    Pt can take protonix with no problems    Sulfa Antibiotics Other (See Comments)    Chest pain    Tegretol [Carbamazepine] Nausea And Vomiting   Prozac [Fluoxetine] Other (See Comments)    Increased Depression and Suicidal thoughts   Tape Other (See Comments)    Skin tears, can only tolerate paper tape.   Tylenol [Acetaminophen] Rash and Other (See Comments)    Rash on face

## 2022-10-05 NOTE — TOC Progression Note (Signed)
Transition of Care [facility]) - Progression Note    Patient Details  Name: [name] MRN: [id number] Date of Birth: [birth year]  [facility] CM/SW Contact  Lorri Frederick, LCSW Phone Number: 10/05/2022, 9:46 AM  Clinical Narrative:   Somewhat tense phone conversation with [facility] community support team.  CSW updated her that pt has not been accepted to [facility], psych team is discussing potential return to community with services.  Genie states they have DC pt and cannot accept her back due to her chronic suicidality, states they cannot see her every day to keep her safe.  CSW acknowledged that, asked about Vesta Mixer responsibility to secure other community services if they cannot resume services.  Genie reports they attempted to refer to ACT services, which was denied.  Reports they "turned over care" to the hospital.  CSW questioned this and Genie said she will speak to her supervisor Lupita Leash and call back.           Expected Discharge Plan and Services                                               Social Determinants of Health (SDOH) Interventions SDOH Screenings   Food Insecurity: No Food Insecurity (07/29/2022)  Housing: Low Risk  (07/29/2022)  Transportation Needs: No Transportation Needs (07/29/2022)  Recent Concern: Transportation Needs - Unmet Transportation Needs (05/06/2022)  Utilities: Not At Risk (07/29/2022)  Alcohol Screen: Low Risk  (07/29/2022)  Depression (PHQ2-9): Medium Risk (03/10/2022)  Tobacco Use: Medium Risk (09/07/2022)    Readmission Risk Interventions    06/06/2022    3:38 PM  Readmission Risk Prevention Plan  Transportation Screening Complete  Medication Review (RN Care Manager) Complete  PCP or Specialist appointment within 3-5 days of discharge Complete  HRI or Home Care Consult Complete  SW Recovery Care/Counseling Consult Complete  Palliative Care Screening Not Applicable  Skilled Nursing Facility Not Applicable

## 2022-10-06 NOTE — Progress Notes (Signed)
Patient very anxious and wanted to see nurse, states she needs to be transferred out to [facility] she is having bad thoughts and needs  therapy. Patient states she wants to kill herself and entire family, patient denies and plan to carry this out and then tells me she is just bored of being here and wants to be moved to [place] so she be involved in groups. Talked to [place] SW and he said still no news [id number] to [facility].  [place], Kae Heller, [place]

## 2022-10-06 NOTE — Plan of Care (Signed)
  Problem: Education: Goal: Knowledge of General Education information will improve Description: Including pain rating scale, medication(s)/side effects and non-pharmacologic comfort measures Outcome: Progressing   Problem: Health Behavior/Discharge Planning: Goal: Ability to manage health-related needs will improve Outcome: Progressing   Problem: Clinical Measurements: Goal: Ability to maintain clinical measurements within normal limits will improve Outcome: Progressing Goal: Will remain free from infection Outcome: Progressing Goal: Diagnostic test results will improve Outcome: Progressing Goal: Cardiovascular complication will be avoided Outcome: Progressing   Problem: Activity: Goal: Risk for activity intolerance will decrease Outcome: Progressing   Problem: Skin Integrity: Goal: Risk for impaired skin integrity will decrease Outcome: Progressing

## 2022-10-06 NOTE — Progress Notes (Signed)
PROGRESS NOTE    [name]  WUJ:[id number] DOB: 02/13/1990 DOA: 08/22/2022 PCP: [facility]    Brief Narrative:  This 32 yrs old female with PMH significant of adjustment disorder with mixed anxiety and depressed mood, autism, bipolar 1 disorder, fragile x syndrome, seizures not on meds, schizoaffective disorder and multiple suicide attempts in the last year presented to the ED after ingesting numerous medications in a suicide attempt. She reported taking #30 metoprolol 25mg  tablets, #60 Hydroxyzine 25mg , #7 Lexapro 20mg , #21 Gabapentin 600mg , #7 Flexeril 10mg , #7 protonix 40mg  and #7 Prazosin 1mg . She was slightly bradycardic and hypotensive on EMS arrival which improved with fluids. Poison control was contacted and as she was within 2hrs of ingestion was given activated charcoal. Patient was placed under IVC in ED. Poison control also recommended ICU admission for close Qtc and electrolyte monitoring and possible pressors. She was intubated and started on vasopressor and glucagon. Also started on ceftriaxone for possible pneumonia. Patient was extubated on 7/18 and transferred to the floor on 7/19. She was transferred to [facility] service on 08/25/22. Psychiatry continues to follow. They recommend inpatient psychiatric hospitalization. Awaiting placement at [facility].    Assessment & Plan:  Suicidal Attempt with intentional overdose:  She presented after having taken multiple medications including # 30 metoprolol 25mg  tablets, # 60 Hydroxyzine 25mg , # 7 Lexapro 20mg , # 21 Gabapentin 600mg , # 7 Flexeril 10mg , # 7 protonix 40mg  and # 7 Prazosin 1mg .  Poison control was contacted.   Received charcoal in the ED.  Initially required vasopressor,  glucagon,  intubation and mechanical ventilation.   Currently on IVC.  Referral was made to APS by psychiatry.   Further care as per psychiatry.   Patient with occasional episodes of agitation which tends to improve after she is given Ativan.  Also  with occasional episodes of chest pain during these episodes of agitation and anxiety.  She has had unremarkable EKG with negative troponin levels in the past.   Denies any chest pain today.   Anxiety and Depression, Schizoaffective Disorder, Adjustment Disorder, bipolar disorder, fibromyalgia and auditory hallucination:  Meds per psych.   Community-acquired pneumonia: Completed antibiotic treatment for community-acquired pneumonia.   Essential hypertension: On metoprolol and prazosin.  Blood pressure is reasonably well-controlled.   Sinus Tachycardia: Continue Lopressor.   -d dimer normal -troponin normal   Metabolic Acidosis: -resolved   Hypomagnesemia: -repleted   Microcytic Anemia: Iron deficiency anemia.  Low ferritin.  Continue oral iron.   History of Seizure Disorder: -Stable.  Not on meds.   History of Asthma: Continue nebulizers.  Stable.   Dysuria: She was  catheterized during ICU.   - recently treated with antibiotics.   Hypoalbuminemia: Encourage oral intake.   Obesity: Body mass index is 36.03 kg/m.  Would benefit from weight loss as outpatient.  Patient refuses labs.  Does not give any reason for same.  She was told about the importance of checking electrolytes every so often but she still declines.  DVT prophylaxis: SCDs Code Status: Full code Family Communication:No family at bed side. Disposition Plan: per psych and TOC    Consultants:  Psychiatry   Subjective: Lying on the bed.  Denies any complaints.  Objective: Vitals:   10/05/22 0946 10/05/22 2042 10/06/22 0432 10/06/22 0915  BP:  127/78 (!) 109/46 112/64  Pulse:  (!) 115 (!) 102 (!) 103  Resp: 18 17 16 18   Temp:  98.5 F (36.9 C) 98 F (36.7 C) 98.3 F (36.8  C)  TempSrc:  Oral Oral Oral  SpO2:  99% 96% 99%  Weight:      Height:        Intake/Output Summary (Last 24 hours) at 10/06/2022 0933 Last data filed at 10/06/2022 1308 Gross per 24 hour  Intake 960 ml  Output --   Net 960 ml    Filed Weights   09/01/22 0300 09/02/22 0500 09/03/22 0402  Weight: 98.8 kg 99 kg 98.2 kg    Examination:  Awake alert.  In no distress      Data Reviewed: I have personally reviewed following labs and imaging studies  CBC: No results for input(s): "WBC", "NEUTROABS", "HGB", "HCT", "MCV", "PLT" in the last 168 hours.  Basic Metabolic Panel: No results for input(s): "NA", "K", "CL", "CO2", "GLUCOSE", "BUN", "CREATININE", "CALCIUM", "MG", "PHOS" in the last 168 hours.  GFR: Estimated Creatinine Clearance: 116.1 mL/min (by C-G formula based on SCr of 0.67 mg/dL). Liver Function Tests: No results for input(s): "AST", "ALT", "ALKPHOS", "BILITOT", "PROT", "ALBUMIN" in the last 168 hours.  No results for input(s): "LIPASE", "AMYLASE" in the last 168 hours. No results for input(s): "AMMONIA" in the last 168 hours. Coagulation Profile: No results for input(s): "INR", "PROTIME" in the last 168 hours. Cardiac Enzymes: No results for input(s): "CKTOTAL", "CKMB", "CKMBINDEX", "TROPONINI" in the last 168 hours. BNP (last 3 results) No results for input(s): "PROBNP" in the last 8760 hours. HbA1C: No results for input(s): "HGBA1C" in the last 72 hours. CBG: No results for input(s): "GLUCAP" in the last 168 hours. Lipid Profile: No results for input(s): "CHOL", "HDL", "LDLCALC", "TRIG", "CHOLHDL", "LDLDIRECT" in the last 72 hours. Thyroid Function Tests: No results for input(s): "TSH", "T4TOTAL", "FREET4", "T3FREE", "THYROIDAB" in the last 72 hours. Anemia Panel: No results for input(s): "VITAMINB12", "FOLATE", "FERRITIN", "TIBC", "IRON", "RETICCTPCT" in the last 72 hours. Sepsis Labs: No results for input(s): "PROCALCITON", "LATICACIDVEN" in the last 168 hours.  No results found for this or any previous visit (from the past 240 hour(s)).   Radiology Studies: No results found.  Scheduled Meds:  cholecalciferol  400 Units Oral Daily   cyclobenzaprine  10 mg Oral  QHS   diclofenac Sodium  2 g Topical QID   DULoxetine  60 mg Oral Daily   ferrous sulfate  325 mg Oral BID WC   gabapentin  600 mg Oral TID   metoprolol tartrate  12.5 mg Oral BID   mometasone-formoterol  2 puff Inhalation BID   mupirocin cream   Topical Daily   OLANZapine  15 mg Oral Daily   OLANZapine zydis  5 mg Oral BID AC   pantoprazole  40 mg Oral QAC breakfast   prazosin  1 mg Oral QHS   sodium bicarbonate  650 mg Oral TID   Continuous Infusions:  sodium chloride Stopped (08/24/22 1347)     LOS: 45 days   This is a no charge note  Osvaldo Shipper,  [facility]   If 7PM-7AM, please contact night-coverage

## 2022-10-07 NOTE — Plan of Care (Signed)
  Problem: Education: Goal: Knowledge of General Education information will improve Description: Including pain rating scale, medication(s)/side effects and non-pharmacologic comfort measures Outcome: Progressing   Problem: Health Behavior/Discharge Planning: Goal: Ability to manage health-related needs will improve Outcome: Progressing   Problem: Clinical Measurements: Goal: Ability to maintain clinical measurements within normal limits will improve Outcome: Progressing Goal: Will remain free from infection Outcome: Progressing Goal: Diagnostic test results will improve Outcome: Progressing Goal: Cardiovascular complication will be avoided Outcome: Progressing   Problem: Coping: Goal: Level of anxiety will decrease Outcome: Progressing   Problem: Safety: Goal: Ability to remain free from injury will improve Outcome: Progressing

## 2022-10-07 NOTE — Progress Notes (Signed)
PROGRESS NOTE    [name]  FAO:[id number] DOB: 1990/05/28 DOA: 08/22/2022 PCP: [facility]    Brief Narrative:  This 32 yrs old female with PMH significant of adjustment disorder with mixed anxiety and depressed mood, autism, bipolar 1 disorder, fragile x syndrome, seizures not on meds, schizoaffective disorder and multiple suicide attempts in the last year presented to the ED after ingesting numerous medications in a suicide attempt. She reported taking #30 metoprolol 25mg  tablets, #60 Hydroxyzine 25mg , #7 Lexapro 20mg , #21 Gabapentin 600mg , #7 Flexeril 10mg , #7 protonix 40mg  and #7 Prazosin 1mg . She was slightly bradycardic and hypotensive on EMS arrival which improved with fluids. Poison control was contacted and as she was within 2hrs of ingestion was given activated charcoal. Patient was placed under IVC in ED. Poison control also recommended ICU admission for close Qtc and electrolyte monitoring and possible pressors. She was intubated and started on vasopressor and glucagon. Also started on ceftriaxone for possible pneumonia. Patient was extubated on 7/18 and transferred to the floor on 7/19. She was transferred to [facility] service on 08/25/22. Psychiatry continues to follow. They recommend inpatient psychiatric hospitalization. Awaiting placement at [facility].    Assessment & Plan:  Suicidal Attempt with intentional overdose:  She presented after having taken multiple medications including # 30 metoprolol 25mg  tablets, # 60 Hydroxyzine 25mg , # 7 Lexapro 20mg , # 21 Gabapentin 600mg , # 7 Flexeril 10mg , # 7 protonix 40mg  and # 7 Prazosin 1mg .  Poison control was contacted.   Received charcoal in the ED.  Initially required vasopressor,  glucagon,  intubation and mechanical ventilation.   Currently on IVC.  Referral was made to APS by psychiatry.   Further care as per psychiatry.   Patient with occasional episodes of agitation which tends to improve after she is given Ativan.  Also  with occasional episodes of chest pain during these episodes of agitation and anxiety.  She has had unremarkable EKG with negative troponin levels in the past.   Denies any chest pain or shortness of breath.   Anxiety and Depression, Schizoaffective Disorder, Adjustment Disorder, bipolar disorder, fibromyalgia and auditory hallucination:  Meds per psych.   Community-acquired pneumonia: Completed antibiotic treatment for community-acquired pneumonia.   Essential hypertension: On metoprolol and prazosin.  Blood pressure is reasonably well-controlled.   Sinus Tachycardia: Continues to have episodes of tachycardia at times.  Likely driven by her anxiety.  Recently checked troponin and D-dimer levels were normal.  Denies any chest pain shortness of breath. TSH was normal back in April.   Metabolic Acidosis: -resolved   Hypomagnesemia: -repleted   Microcytic Anemia: Iron deficiency anemia.  Low ferritin.  Continue oral iron.   History of Seizure Disorder: -Stable.  Not on meds.   History of Asthma: Continue nebulizers.  Stable.   Dysuria: She was  catheterized during ICU.   - recently treated with antibiotics.   Hypoalbuminemia: Encourage oral intake.   Obesity: Body mass index is 36.03 kg/m.  Would benefit from weight loss as outpatient.  Patient refuses labs.  Does not give any reason for same.  She was told about the importance of checking electrolytes every so often but she still declines.  DVT prophylaxis: SCDs Code Status: Full code Family Communication:No family at bed side. Disposition Plan: per psych and [facility]    Consultants:  Psychiatry   Subjective: Patient noted to be lying on the bed.  No complaints offered.  Objective: Vitals:   10/06/22 6962 10/06/22 0915 10/06/22 2050 10/07/22 9528  BP: (!) 109/46 112/64 123/69   Pulse: (!) 102 (!) 103 (!) 115 (!) 108  Resp: 16 18 17 18   Temp: 98 F (36.7 C) 98.3 F (36.8 C) 98.9 F (37.2 C)   TempSrc: Oral  Oral Oral   SpO2: 96% 99% 99% 98%  Weight:      Height:        Intake/Output Summary (Last 24 hours) at 10/07/2022 0934 Last data filed at 10/07/2022 0917 Gross per 24 hour  Intake 960 ml  Output --  Net 960 ml    Filed Weights   09/01/22 0300 09/02/22 0500 09/03/22 0402  Weight: 98.8 kg 99 kg 98.2 kg    Examination:  Awake alert.  In no distress.     Scheduled Meds:  cholecalciferol  400 Units Oral Daily   cyclobenzaprine  10 mg Oral QHS   diclofenac Sodium  2 g Topical QID   DULoxetine  60 mg Oral Daily   ferrous sulfate  325 mg Oral BID WC   gabapentin  600 mg Oral TID   metoprolol tartrate  12.5 mg Oral BID   mometasone-formoterol  2 puff Inhalation BID   mupirocin cream   Topical Daily   OLANZapine  15 mg Oral Daily   OLANZapine zydis  5 mg Oral BID AC   pantoprazole  40 mg Oral QAC breakfast   prazosin  1 mg Oral QHS   sodium bicarbonate  650 mg Oral TID   Continuous Infusions:  sodium chloride Stopped (08/24/22 1347)     LOS: 46 days   This is a no charge note  Osvaldo Shipper,  [facility]   If 7PM-7AM, please contact night-coverage

## 2022-10-08 DIAGNOSIS — F315 Bipolar disorder, current episode depressed, severe, with psychotic features: Secondary | ICD-10-CM | POA: Diagnosis not present

## 2022-10-08 LAB — COMPREHENSIVE METABOLIC PANEL
ALT: 19 U/L (ref 0–44)
AST: 18 U/L (ref 15–41)
Albumin: 3.4 g/dL — ABNORMAL LOW (ref 3.5–5.0)
Alkaline Phosphatase: 75 U/L (ref 38–126)
Anion gap: 11 (ref 5–15)
BUN: 9 mg/dL (ref 6–20)
CO2: 25 mmol/L (ref 22–32)
Calcium: 8.8 mg/dL — ABNORMAL LOW (ref 8.9–10.3)
Chloride: 102 mmol/L (ref 98–111)
Creatinine, Ser: 0.75 mg/dL (ref 0.44–1.00)
GFR, Estimated: >60 mL/min (ref >60)
Glucose, Bld: 109 mg/dL — ABNORMAL HIGH (ref 70–99)
Potassium: 3.7 mmol/L (ref 3.5–5.1)
Sodium: 138 mmol/L (ref 135–145)
Total Bilirubin: 0.6 mg/dL (ref 0.3–1.2)
Total Protein: 6.8 g/dL (ref 6.5–8.1)

## 2022-10-08 LAB — CBC WITH DIFFERENTIAL/PLATELET
Abs Immature Granulocytes: 0.06 10*3/uL (ref 0.00–0.07)
Basophils Absolute: 0.1 10*3/uL (ref 0.0–0.1)
Basophils Relative: 1 %
Eosinophils Absolute: 0.1 10*3/uL (ref 0.0–0.5)
Eosinophils Relative: 1 %
HCT: 39.5 % (ref 36.0–46.0)
Hemoglobin: 12.3 g/dL (ref 12.0–15.0)
Immature Granulocytes: 1 %
Lymphocytes Relative: 21 %
Lymphs Abs: 2 10*3/uL (ref 0.7–4.0)
MCH: 25.5 pg — ABNORMAL LOW (ref 26.0–34.0)
MCHC: 31.1 g/dL (ref 30.0–36.0)
MCV: 82 fL (ref 80.0–100.0)
Monocytes Absolute: 0.6 10*3/uL (ref 0.1–1.0)
Monocytes Relative: 6 %
Neutro Abs: 6.8 10*3/uL (ref 1.7–7.7)
Neutrophils Relative %: 70 %
Platelets: 312 10*3/uL (ref 150–400)
RBC: 4.82 MIL/uL (ref 3.87–5.11)
RDW: 16.2 % — ABNORMAL HIGH (ref 11.5–15.5)
WBC: 9.7 10*3/uL (ref 4.0–10.5)
nRBC: 0 % (ref 0.0–0.2)

## 2022-10-08 LAB — MAGNESIUM: Magnesium: 1.8 mg/dL (ref 1.7–2.4)

## 2022-10-08 NOTE — Progress Notes (Signed)
PROGRESS NOTE    [name]  ZOX:[id number] DOB: 11/10/1990 DOA: 08/22/2022 PCP: [facility]    Brief Narrative:  This 32 yrs old female with PMH significant of adjustment disorder with mixed anxiety and depressed mood, autism, bipolar 1 disorder, fragile x syndrome, seizures not on meds, schizoaffective disorder and multiple suicide attempts in the last year presented to the ED after ingesting numerous medications in a suicide attempt. She reported taking #30 metoprolol 25mg  tablets, #60 Hydroxyzine 25mg , #7 Lexapro 20mg , #21 Gabapentin 600mg , #7 Flexeril 10mg , #7 protonix 40mg  and #7 Prazosin 1mg . She was slightly bradycardic and hypotensive on EMS arrival which improved with fluids. Poison control was contacted and as she was within 2hrs of ingestion was given activated charcoal. Patient was placed under IVC in ED. Poison control also recommended ICU admission for close Qtc and electrolyte monitoring and possible pressors. She was intubated and started on vasopressor and glucagon. Also started on ceftriaxone for possible pneumonia. Patient was extubated on 7/18 and transferred to the floor on 7/19. She was transferred to [facility] service on 08/25/22. Psychiatry continues to follow. They recommend inpatient psychiatric hospitalization. Awaiting placement at [facility].    Assessment & Plan:  Suicidal Attempt with intentional overdose:  She presented after having taken multiple medications including # 30 metoprolol 25mg  tablets, # 60 Hydroxyzine 25mg , # 7 Lexapro 20mg , # 21 Gabapentin 600mg , # 7 Flexeril 10mg , # 7 protonix 40mg  and # 7 Prazosin 1mg .  Poison control was contacted.   Received charcoal in the ED.  Initially required vasopressor,  glucagon,  intubation and mechanical ventilation.   Currently on IVC.  Referral was made to APS by psychiatry.   Further care as per psychiatry.   Patient with occasional episodes of agitation which tends to improve after she is given Ativan.  Also  with occasional episodes of chest pain during these episodes of agitation and anxiety.  She has had unremarkable EKG with negative troponin levels in the past.   No chest pain or shortness of breath reported today.  Agreeable to undergo blood work this morning.   Anxiety and Depression, Schizoaffective Disorder, Adjustment Disorder, bipolar disorder, fibromyalgia and auditory hallucination:  Meds per psych.   Community-acquired pneumonia: Completed antibiotic treatment for community-acquired pneumonia.   Essential hypertension: On metoprolol and prazosin.  Blood pressure is reasonably well-controlled.   Sinus Tachycardia: Continues to have episodes of tachycardia at times.  Likely driven by her anxiety.  Recently checked troponin and D-dimer levels were normal.  Denies any chest pain shortness of breath. TSH was normal back in April.   Metabolic Acidosis: -resolved   Hypomagnesemia: -repleted   Microcytic Anemia: Iron deficiency anemia.  Low ferritin.  Continue oral iron.   History of Seizure Disorder: -Stable.  Not on meds.   History of Asthma: Continue nebulizers.  Stable.   Dysuria: She was  catheterized during ICU.   - recently treated with antibiotics.   Hypoalbuminemia: Encourage oral intake.   Obesity: Body mass index is 36.03 kg/m.  Would benefit from weight loss as outpatient.   DVT prophylaxis: SCDs Code Status: Full code Family Communication:No family at bed side. Disposition Plan: per psych and [facility]    Consultants:  Psychiatry   Subjective: Sitting on the chair.  No complaints offered.  Denies any chest pain shortness of breath.  No nausea vomiting.  Agreeable to undergo blood work this morning.  Objective: Vitals:   10/06/22 2050 10/07/22 0831 10/07/22 2015 10/07/22 2148  BP: 123/69  119/71  Pulse: (!) 115 (!) 108  (!) 104  Resp: 17 18  16   Temp: 98.9 F (37.2 C)   97.6 F (36.4 C)  TempSrc: Oral   Oral  SpO2: 99% 98% 98%   Weight:       Height:        Intake/Output Summary (Last 24 hours) at 10/08/2022 0917 Last data filed at 10/08/2022 0900 Gross per 24 hour  Intake 960 ml  Output 1 ml  Net 959 ml    Filed Weights   09/01/22 0300 09/02/22 0500 09/03/22 0402  Weight: 98.8 kg 99 kg 98.2 kg    Examination:  Awake alert.  In no distress. S1-S2 is normal regular. Lungs are clear to auscultation bilaterally.     Scheduled Meds:  cholecalciferol  400 Units Oral Daily   cyclobenzaprine  10 mg Oral QHS   diclofenac Sodium  2 g Topical QID   DULoxetine  60 mg Oral Daily   ferrous sulfate  325 mg Oral BID WC   gabapentin  600 mg Oral TID   metoprolol tartrate  12.5 mg Oral BID   mometasone-formoterol  2 puff Inhalation BID   mupirocin cream   Topical Daily   OLANZapine  15 mg Oral Daily   OLANZapine zydis  5 mg Oral BID AC   pantoprazole  40 mg Oral QAC breakfast   prazosin  1 mg Oral QHS   sodium bicarbonate  650 mg Oral TID   Continuous Infusions:  sodium chloride Stopped (08/24/22 1347)     LOS: 47 days    Osvaldo Shipper,  [facility]   If 7PM-7AM, please contact night-coverage

## 2022-10-08 NOTE — Plan of Care (Signed)
  Problem: Education: Goal: Knowledge of General Education information will improve Description Including pain rating scale, medication(s)/side effects and non-pharmacologic comfort measures Outcome: Progressing   Problem: Activity: Goal: Risk for activity intolerance will decrease Outcome: Progressing   Problem: Pain Managment: Goal: General experience of comfort will improve Outcome: Progressing   Problem: Safety: Goal: Ability to remain free from injury will improve Outcome: Progressing   Problem: Skin Integrity: Goal: Risk for impaired skin integrity will decrease Outcome: Progressing   

## 2022-10-08 NOTE — Consult Note (Cosign Needed Addendum)
[name] Consult Evaluation  Service Date: October 08, 2022 LOS:  LOS: 47 days    Primary Psychiatric Diagnoses   Schizoaffective disorder, bipolar type  GAD  Borderline personality disorder  Assessment  Demariyah Orvetta Barrientez is a 32 y.o. female with a past psychiatric history of borderline personality disorder, schizoaffective disorder bipolar type, and PTSD.  She has had numerous behavioral health admissions and an extensive history of suicide attempts.  Patient presents to [facility] ED for an attempted overdose on her prescription medications.  Briefly, this pt has had ~50 ED visits or psych hospitalizations during 2024; this continues a pattern of an inability to maintain safety in the outpatient setting going back multiple years. The vast majority of her suicide attempts have been via overdose on psychotropic medication. She has failed multiple levels of outpatient management up to and including CST; she has been declined by multiple psychiatric facilities due to futility, and she has been discharged by her CST team as of 8/8. She is currently on the [facility] priority wait list. She is not eligible for ACT services through [facility] due to BPD diagnosis.   We are trying to simplify patient's medication regimen to avoid polypharmacy especially in the setting of her recent overdose. Earlier in hospitalization during a period of improvement (roughly 8/1 to 8/8), we discussed medication management if and when she does return home including closer monitoring, less access to her medications through a locked cabinet, and her ex-boyfriend being more in charge of her medications in which was confirmed with her ex-boyfriend before being dropped by her CST.  After notification of discontinuation from CST services and ineligibility for ACT services, patient had acute worsening of reported hallucinations, suicidality, homicidality and agitation (fear of abandonment is central feature in BPD) and  consideration of community f/u was terminated. Patient has an extensive history of ER visits and hospitalizations in which she has displayed aggression, psychosis, and danger to self even while in proximity of a Recruitment consultant. Patient also shows high level of lethality, intensity, and frequency of suicidal attempts with multiple inpatient psychiatric admissions with little benefit so we continue to recommend [facility]; given wait list will also refer to other psychiatric hospitals. Patient is currently on the [facility] priority wait list and we are working closely with LCSW with bed availability; exploring other state hospital options.   09/02: As of this morning, the patient is reporting positively on her condition, actively denying any suicidal ideations (SI), homicidal ideations (HI), or auditory/visual hallucinations (AVH). She has shown a good understanding of the coping strategies however does not use them. Notably, she has become self-aware of her physical responses to emotional stress, such as clenching her fists, and effectively communicated her needs to the staff by requesting Ativan and Atarax last night.  Despite these positive developments, the patient remains disengaged from the discharge planning process. Each attempt at planning her discharge has been met with intentional self-harm and other sabotaging behaviors, indicating a clear pattern that complicates her treatment progression.The patient's consistent engagement in behaviors that undermine discharge efforts, coupled with her unpredictable nature, confirms that she continues to meet the criteria for inpatient treatment at the state level. Her understanding of the consequences of behavioral outbursts contrasts with her actions, suggesting complexities in her condition that require further intensive management.   Diagnoses:  Active Hospital problems: Principal Problem:   Bipolar I disorder, current or most recent episode depressed, with psychotic  features [facility]) Active Problems:   Asthma  GAD (generalized anxiety disorder)   Suicide attempt [facility])   Overdose   Drug overdose    Plan   ## Psychiatric Medication Recommendations:  -- Continue Zyprexa to 5 mg po before lunch and supper for mood stabilization during the day (last changed 8/23) -- Continue Zyprexa 15 mg po nightly for psychosis  -- Continue Cymbalta 60 mg daily -- Continue Gabapentin 600 mg TID for agitation, mood/anxiety -- Continue prazosin 1 mg at bedtime for PTSD  Agitation Protocol -- PRN Zyprexa 10 mg IM for agitation -- PRN Ativan 0.5 mg oral BID PRN for agitation -- PRN Atarax 25 mg TID for anxiety, agitation  ## Medical Decision Making Capacity:   Capacity was not formally addressed during this encounter  ## Further Work-up:  -- Per primary  -- most recent EKG on 09/03/2022 had QtC of 470 EKG reading: Sinus tachycardia Inferior infarct (cited on or before 04-Oct-2022) Anterolateral infarct , age undetermined Abnormal ECG When compared with ECG of 26-Sep-2022 11:59, Anterior infarct is now Present Anterolateral infarct is now Present Nonspecific T wave abnormality no longer evident in Anterior leads I informed attending medical physician of the recent EKG. I ordered a troponin and d dimer.  ## Disposition:  -- We recommend inpatient psychiatric hospitalization after medical hospitalization. Patient has been involuntarily committed on 09/04/2022.  -- IVC renewed on 8/28, due for renewal on 09/04 -- Patient remains on [facility] priority wait list  ## Behavioral / Environmental:  --  To minimize splitting of staff, assign one staff person to communicate all information from the team when feasible. -- Apply the established behavioral rules and protocols consistently throughout hospitalization (see nursing care order for details). Ensure that all staff members are aware of and adhere to the same guidelines to avoid confusion and mixed messages.  ## Safety  and Observation Level:  - Based on my clinical evaluation, I estimate the patient to be at elevated risk of self harm in the current setting - At this time, we recommend continuing a 1:1 level of observation. This decision is based on my review of the chart including patient's history and current presentation, interview of the patient, mental status examination, and consideration of suicide risk including evaluating suicidal ideation, plan, intent, suicidal or self-harm behaviors, risk factors, and protective factors. This judgment is based on our ability to directly address suicide risk, implement suicide prevention strategies and develop a safety plan while the patient is in the clinical setting. Please contact our team if there is a concern that risk level has changed.  Suicide risk assessment  Patient has following modifiable risk factors for suicide: active suicidal ideation, under treated depression , recklessness, and lack of access to outpatient mental health resources, which we are addressing by continuing adequate safety precautions while she is hospitalized and adjusting medications   Patient has following non-modifiable or demographic risk factors for suicide: early widowhood, history of suicide attempt, history of self harm behavior, and psychiatric hospitalization  Patient has the following protective factors against suicide: Supportive friends  Thank you for this consult request. Recommendations have been communicated to the primary team.  We will follow at this time.  Will see every M-W-F.    Maryagnes Amos, FNP   Total Time Spent in Direct Patient Care:  I personally spent 30 minutes on the unit in direct patient care. The direct patient care time included face-to-face time with the patient, reviewing the patient's chart, communicating with other professionals, and coordinating care. Greater than  50% of this time was spent in counseling or coordinating care with the patient  regarding goals of hospitalization, psycho-education, and discharge planning needs.    Psychiatric and Social History   Relevant Aspects of Hospital Course:  Admitted on 08/22/2022 for suicide attempt by overdose on her prescription medication.   Patient Report:  No overnight events noted. Per nursing note, no restraints, calm and cooperative throughout the night.   PRN meds given 09/01: atarax 1x, ativan 1x, Olanzapine  Patient seen this morning sitting up in bed eating lunch. Patient initially guarded and not attempting to engage. Most of her answers are "yea, I dont know, and no." SHe also shrugs her shoulders a lot. She makes no attempts to engage and has poor eye contact.  She is aware that she remains on [facility] wait list, and most attempts to discuss discharge are met with bluntness and evasiveness. She denies si/hi/avh.   Collateral 8/29: Spoke to Fayrene Fearing and he stated that he visited [facility]ancy earlier this week in which she asked him to throw away her books because she was going to [facility].  8/14: called Fayrene Fearing, no response 8/12: tried to call Fayrene Fearing with no response.  8/9: called Debra with CST - stated she would be happy to conduct a termination session with Arnelle if she reached out . She has not been called by [name] during this hospitalization and [facility] has refused offered termination session.  8/9: called Dr. Pershing Proud with [facility] to discuss pt's recent decompensation  8/5: spoke with Fayrene Fearing  [facility] [employer] in to note above   Past Psychiatric History: Previous Psych Diagnoses: Schizoaffective disorder bipolar type, Anxiety, Autism spectrum disorder, PTSD, Borderline personality disorder Prior inpatient treatment: multiple, 15 ED visits and 4 inpatient admissions since 06/2022 (~50 total in 2024), admitted to [facility] from 07/29/22-08/02/2022, has been to [facility] for eight months in 2016 which led to period of stability, and was discharged from inpatient facility within a  week of her OD Current/prior outpatient treatment: [facility], CST team twice a week Prior rehab hx: denies multiple Psychotherapy hx: [facility] History of suicide: 20-30 times by overdosing on pills, cutting or drinking 409 disinfectant History of homicide or aggression: denies Psychiatric medication history:  Previously trials of Tegretol, Haldol, Clozaril, Depakote, Invega, Lamictal, Seroquel, Asenapine, Abilify, vraylar (worsened suicidal thoughts), Lexapro, Zoloft  Neuromodulation history: denies  Social History:  Marital status: Widowed Children: 86 year old daughter Lives: with ex-boyfriend Fayrene Fearing, in two story home Employment: Disability, 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  Access to weapons: None   Exam Findings   Psychiatric Specialty Exam:  Presentation  General Appearance: Appropriate for Environment  Eye Contact:Fair  Speech:Clear and Coherent  Speech Volume:Normal  Handedness:Right   Mood and Affect  Mood:-- Depressed  Affect:-- Congruent     Thought Process  Thought Processes:Coherent  Descriptions of Associations:Intact  Orientation:Full (Time, Place and Person)  Thought Content:Logical  History of Schizophrenia/Schizoaffective disorder: Yes  Duration of Psychotic Symptoms:Greater than six months  Hallucinations: Denies  Ideas of Reference:None  Suicidal Thoughts: Denies  Homicidal Thoughts: Denies   Sensorium  Memory:Immediate Good; Recent Good; Remote Good  Judgment:Impaired  Insight: Fair   Art therapist  Concentration:Good  Attention Span:Good  Recall:Fair  Fund of Knowledge:Fair  Language:Fair   Psychomotor Activity  Psychomotor Activity: Normal  Assets  Assets:Desire for Improvement; Resilience; Communication Skills; Housing   Sleep  Sleep: Fair    Physical Exam:  Vital signs:  Temp:  [97.6 F (36.4 C)-98.6 F (37 C)] 98.6 F (37 C) (09/02  1307) Pulse Rate:  [104] 104 (09/01 2148) Resp:  [16-19] 18 (09/02 1307) BP: (117-124)/(58-71) 124/70 (09/02 1307) SpO2:  [97 %-98 %] 98 % (09/02 1307) Physical Exam Vitals and nursing note reviewed. Exam conducted with a chaperone present.  Constitutional:      General: She is not in acute distress.    Appearance: She is obese. She is not ill-appearing.  HENT:     Head: Normocephalic.  Cardiovascular:     Rate and Rhythm: Normal rate.  Pulmonary:     Effort: Pulmonary effort is normal. No respiratory distress.  Neurological:     General: No focal deficit present.     Mental Status: She is alert.  Psychiatric:        Attention and Perception: Attention normal.        Speech: Speech normal.     Blood pressure 124/70, pulse (!) 104, temperature 98.6 F (37 C), temperature source Oral, resp. rate 18, height 5\' 5"  (1.651 m), weight 98.2 kg, last menstrual period 08/08/2022, SpO2 98%. Body mass index is 36.03 kg/m.   Other History   These have been pulled in through the EMR, reviewed, and updated if appropriate.   Family History:  The patient's family history includes Asthma in her father; Mental illness in her father; PDD in her brother; Seizures in her brother.  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    Chronic constipation    Depression    Drug-seeking behavior    Essential tremor    Headache    Ineffective individual coping 05/16/2022   Insomnia 01/12/2022   Intentional drug overdose (HCC) 06/05/2022   Neuromuscular disorder (HCC)    Normocytic anemia 06/05/2022   Overdose 07/22/2017   Overdose of acetaminophen 07/2017   and other  meds   Overdose, intentional self-harm, initial encounter (HCC) 07/20/2021   Paranoia (HCC) 04/22/2021   Personality disorder (HCC)    Purposeful non-suicidal drug ingestion (HCC) 06/27/2021   Schizo-affective psychosis (HCC)    Schizoaffective disorder (HCC) 07/29/2022   Schizoaffective disorder, bipolar type (HCC)    Seizures (HCC)    Last seizure December 2017   Skin erythema 04/27/2022   Sleep apnea    Suicidal behavior 07/25/2021   Suicidal ideation    Suicide (HCC) 07/01/2021   Suicide attempt (HCC) 07/04/2021    Surgical History: Past Surgical History:  Procedure Laterality Date   MOUTH SURGERY  2009 or 2010    Medications:   Current Facility-Administered Medications:    0.9 %  sodium chloride infusion, 250 mL, Intravenous, Continuous, Gonfa, Taye T, MD, Stopped at 08/24/22 1347   albuterol (PROVENTIL) (2.5 MG/3ML) 0.083% nebulizer solution 3 mL, 3 mL, Inhalation, Q6H PRN, Gonfa, Taye T, MD   cholecalciferol (VITAMIN D3) 10 MCG (400 UNIT) tablet 400 Units, 400 Units, Oral, Daily, Cinderella, Margaret A, 400 Units at 10/08/22 0943   cyclobenzaprine (FLEXERIL) tablet 10 mg, 10 mg, Oral, QHS, Gonfa, Taye T, MD, 10 mg at 10/07/22 2144   diclofenac Sodium (VOLTAREN) 1 % topical  gel 2 g, 2 g, Topical, QID, Gonfa, Taye T, MD, 2 g at 10/08/22 1250   DULoxetine (CYMBALTA) DR capsule 60 mg, 60 mg, Oral, Daily, Akintayo, Mojeed, MD, 60 mg at 10/08/22 0840   ferrous sulfate tablet 325 mg, 325 mg, Oral, BID WC, Gonfa, Taye T, MD, 325 mg at 10/08/22 0840   gabapentin (NEURONTIN) capsule 600 mg, 600 mg, Oral, TID, Alanda Slim, Taye T, MD, 600 mg at 10/08/22 0841   hydrOXYzine (ATARAX) tablet 25 mg, 25 mg, Oral, TID PRN, Candelaria Stagers T, MD, 25 mg at 10/07/22 1206   ibuprofen (ADVIL) tablet 600 mg, 600 mg, Oral, TID PRN, Alanda Slim, Taye T, MD, 600 mg at 10/08/22 1250   LORazepam (ATIVAN) tablet 0.5 mg, 0.5 mg, Oral, BID PRN, Alanda Slim, Taye T, MD, 0.5 mg at 10/07/22 1505   metoprolol tartrate  (LOPRESSOR) tablet 12.5 mg, 12.5 mg, Oral, BID, Vann, Jessica U, DO, 12.5 mg at 10/08/22 0943   mometasone-formoterol (DULERA) 200-5 MCG/ACT inhaler 2 puff, 2 puff, Inhalation, BID, Alanda Slim, Taye T, MD, 2 puff at 10/08/22 0820   mupirocin cream (BACTROBAN) 2 %, , Topical, Daily, Marguerita Merles Whitefield, DO, Given at 09/27/22 0859   OLANZapine (ZYPREXA) injection 10 mg, 10 mg, Intramuscular, Daily PRN, Kizzie Ide B, MD, 10 mg at 10/07/22 0141   OLANZapine (ZYPREXA) tablet 15 mg, 15 mg, Oral, Daily, Cinderella, Margaret A, 15 mg at 10/07/22 2144   OLANZapine zydis (ZYPREXA) disintegrating tablet 5 mg, 5 mg, Oral, BID AC, Mariel Craft, MD, 5 mg at 10/08/22 1207   ondansetron (ZOFRAN-ODT) disintegrating tablet 4 mg, 4 mg, Oral, Q8H PRN, Sheikh, Omair Latif, DO, 4 mg at 10/04/22 1638   Oral care mouth rinse, 15 mL, Mouth Rinse, PRN, Osvaldo Shipper, MD   pantoprazole (PROTONIX) EC tablet 40 mg, 40 mg, Oral, QAC breakfast, Gonfa, Taye T, MD, 40 mg at 10/08/22 0841   polyethylene glycol (MIRALAX / GLYCOLAX) packet 17 g, 17 g, Oral, BID PRN, Alanda Slim, Taye T, MD, 17 g at 10/05/22 2043   prazosin (MINIPRESS) capsule 1 mg, 1 mg, Oral, QHS, Gonfa, Taye T, MD, 1 mg at 10/07/22 2214   senna-docusate (Senokot-S) tablet 1 tablet, 1 tablet, Oral, BID PRN, Candelaria Stagers T, MD, 1 tablet at 10/06/22 2041   sodium bicarbonate tablet 650 mg, 650 mg, Oral, TID, Candelaria Stagers T, MD, 650 mg at 10/08/22 0840  Allergies: Allergies  Allergen Reactions   Bee Venom Anaphylaxis   Coconut Flavor Anaphylaxis and Rash   Fish Allergy Anaphylaxis   Geodon [Ziprasidone Hcl] Other (See Comments)    Pt states that this medication causes paralysis of the mouth.     Haloperidol And Related Other (See Comments)    Pt states that this medication causes paralysis of the mouth, jaw locks up   Lithobid [Lithium] Other (See Comments)    Seizure-like activity    Roxicodone [Oxycodone] Other (See Comments)    Hallucinations    Seroquel  [Quetiapine] Other (See Comments)    Severe drowsiness - Pt currently taking 12.5 mg BID, she is ok with this dose   Shellfish Allergy Anaphylaxis   Phenergan [Promethazine Hcl] Other (See Comments)    Chest pain     Prilosec [Omeprazole] Nausea And Vomiting and Other (See Comments)    Pt can take protonix with no problems    Sulfa Antibiotics Other (See Comments)    Chest pain    Tegretol [Carbamazepine] Nausea And Vomiting   Prozac [Fluoxetine] Other (See Comments)  Increased Depression and Suicidal thoughts   Tape Other (See Comments)    Skin tears, can only tolerate paper tape.   Tylenol [Acetaminophen] Rash and Other (See Comments)    Rash on face

## 2022-10-08 NOTE — Plan of Care (Signed)
  Problem: Activity: Goal: Risk for activity intolerance will decrease Outcome: Progressing   Problem: Elimination: Goal: Will not experience complications related to bowel motility Outcome: Progressing   Problem: Skin Integrity: Goal: Risk for impaired skin integrity will decrease Outcome: Progressing   Problem: Fluid Volume: Goal: Ability to maintain a balanced intake and output will improve Outcome: Progressing   Problem: Metabolic: Goal: Ability to maintain appropriate glucose levels will improve Outcome: Progressing   Problem: Nutritional: Goal: Maintenance of adequate nutrition will improve Outcome: Progressing Goal: Progress toward achieving an optimal weight will improve Outcome: Progressing   Problem: Skin Integrity: Goal: Risk for impaired skin integrity will decrease Outcome: Progressing

## 2022-10-09 NOTE — Plan of Care (Signed)
  Problem: Clinical Measurements: Goal: Will remain free from infection Outcome: Progressing Goal: Diagnostic test results will improve Outcome: Progressing   Problem: Elimination: Goal: Will not experience complications related to bowel motility Outcome: Progressing   Problem: Role Relationship: Goal: Method of communication will improve Outcome: Progressing   Problem: Nutritional: Goal: Maintenance of adequate nutrition will improve Outcome: Progressing   Problem: Skin Integrity: Goal: Risk for impaired skin integrity will decrease Outcome: Progressing   Problem: Tissue Perfusion: Goal: Adequacy of tissue perfusion will improve Outcome: Progressing

## 2022-10-09 NOTE — Progress Notes (Signed)
PROGRESS NOTE    [name]  WUJ:[id number] DOB: 1990/10/07 DOA: 08/22/2022 PCP: [facility]    Brief Narrative:  This 32 yrs old female with PMH significant of adjustment disorder with mixed anxiety and depressed mood, autism, bipolar 1 disorder, fragile x syndrome, seizures not on meds, schizoaffective disorder and multiple suicide attempts in the last year presented to the ED after ingesting numerous medications in a suicide attempt. She reported taking #30 metoprolol 25mg  tablets, #60 Hydroxyzine 25mg , #7 Lexapro 20mg , #21 Gabapentin 600mg , #7 Flexeril 10mg , #7 protonix 40mg  and #7 Prazosin 1mg . She was slightly bradycardic and hypotensive on EMS arrival which improved with fluids. Poison control was contacted and as she was within 2hrs of ingestion was given activated charcoal. Patient was placed under IVC in ED. Poison control also recommended ICU admission for close Qtc and electrolyte monitoring and possible pressors. She was intubated and started on vasopressor and glucagon. Also started on ceftriaxone for possible pneumonia. Patient was extubated on 7/18 and transferred to the floor on 7/19. She was transferred to [facility] service on 08/25/22. Psychiatry continues to follow. They recommend inpatient psychiatric hospitalization. Awaiting placement at [facility]    Assessment & Plan:  Suicidal Attempt with intentional overdose:  She presented after having taken multiple medications including # 30 metoprolol 25mg  tablets, # 60 Hydroxyzine 25mg , # 7 Lexapro 20mg , # 21 Gabapentin 600mg , # 7 Flexeril 10mg , # 7 protonix 40mg  and # 7 Prazosin 1mg .  Poison control was contacted.   Received charcoal in the ED.  Initially required vasopressor,  glucagon,  intubation and mechanical ventilation.   Currently on IVC.  Referral was made to APS by psychiatry.   Further care as per psychiatry.   Patient with occasional episodes of agitation which tends to improve after she is given Ativan.  Also  with occasional episodes of chest pain during these episodes of agitation and anxiety.  She has had unremarkable EKG with negative troponin levels in the past.   Labs were reviewed.  Results communicated to the patient. Denies any complaints this morning.   Anxiety and Depression, Schizoaffective Disorder, Adjustment Disorder, bipolar disorder, fibromyalgia and auditory hallucination:  Meds per psych.   Community-acquired pneumonia: Completed antibiotic treatment for community-acquired pneumonia.   Essential hypertension: On metoprolol and prazosin.  Blood pressure is reasonably well-controlled.   Sinus Tachycardia: Continues to have episodes of tachycardia at times.  Likely driven by her anxiety.  Recently checked troponin and D-dimer levels were normal.  Denies any chest pain shortness of breath. TSH was normal back in April.   Metabolic Acidosis: -resolved   Hypomagnesemia: -repleted   Microcytic Anemia: Iron deficiency anemia.  Low ferritin.  Continue oral iron.   History of Seizure Disorder: -Stable.  Not on meds.   History of Asthma: Continue nebulizers.  Stable.   Dysuria: She was  catheterized during ICU.   - recently treated with antibiotics.   Hypoalbuminemia: Encourage oral intake.   Obesity: Body mass index is 36.03 kg/m.  Would benefit from weight loss as outpatient.   DVT prophylaxis: SCDs Code Status: Full code Family Communication:No family at bed side. Disposition Plan: per psych and TOC    Consultants:  Psychiatry   Subjective: Lying on the bed.  Denies any complaints.  Objective: Vitals:   10/08/22 0944 10/08/22 1307 10/08/22 2105 10/09/22 0506  BP: (!) 117/58 124/70 129/75 114/71  Pulse:   (!) 102 100  Resp: 19 18 16 15   Temp:  98.6 F (37 C)  97.6 F (36.4 C) 98.1 F (36.7 C)  TempSrc:  Oral Oral Oral  SpO2: 97% 98% 100% 98%  Weight:      Height:        Intake/Output Summary (Last 24 hours) at 10/09/2022 0904 Last data filed  at 10/08/2022 2306 Gross per 24 hour  Intake 1220 ml  Output 4 ml  Net 1216 ml    Filed Weights   09/01/22 0300 09/02/22 0500 09/03/22 0402  Weight: 98.8 kg 99 kg 98.2 kg    Examination:  Awake alert.  In no distress     Scheduled Meds:  cholecalciferol  400 Units Oral Daily   cyclobenzaprine  10 mg Oral QHS   diclofenac Sodium  2 g Topical QID   DULoxetine  60 mg Oral Daily   ferrous sulfate  325 mg Oral BID WC   gabapentin  600 mg Oral TID   metoprolol tartrate  12.5 mg Oral BID   mometasone-formoterol  2 puff Inhalation BID   mupirocin cream   Topical Daily   OLANZapine  15 mg Oral Daily   OLANZapine zydis  5 mg Oral BID AC   pantoprazole  40 mg Oral QAC breakfast   prazosin  1 mg Oral QHS   sodium bicarbonate  650 mg Oral TID   Continuous Infusions:  sodium chloride Stopped (08/24/22 1347)     LOS: 48 days   This is a no charge note.  Osvaldo Shipper,  [facility]   If 7PM-7AM, please contact night-coverage

## 2022-10-09 NOTE — Progress Notes (Addendum)
Summoned to patient's room by sitter. I noted patient seating down in the bathroom floor with sitter in bathroom, when asked patient why is she seating down in floor she replied " I was banging my head because the voices in my head told me to do it and they wont stop" I asked patient to get up from the floor and go to bed, and offered support. Pt was cooperative with demand. Ativan 0.5mg  and atarax 25mg  administered (see emar for admin times).

## 2022-10-09 NOTE — Progress Notes (Signed)
Pt is calm, states that the voices in her head stopped and feels more relaxed.

## 2022-10-10 DIAGNOSIS — F315 Bipolar disorder, current episode depressed, severe, with psychotic features: Secondary | ICD-10-CM | POA: Diagnosis not present

## 2022-10-10 DIAGNOSIS — T1491XA Suicide attempt, initial encounter: Secondary | ICD-10-CM | POA: Diagnosis not present

## 2022-10-10 MED ORDER — SODIUM BICARBONATE 650 MG PO TABS
650.0000 mg | ORAL_TABLET | Freq: Every day | ORAL | Status: DC
  Administered 2022-10-11 – 2022-10-19 (×9): 650 mg via ORAL
  Filled 2022-10-10 (×9): qty 1

## 2022-10-10 NOTE — Plan of Care (Signed)
  Problem: Health Behavior/Discharge Planning: Goal: Ability to manage health-related needs will improve Outcome: Progressing   Problem: Activity: Goal: Risk for activity intolerance will decrease Outcome: Progressing   Problem: Role Relationship: Goal: Method of communication will improve Outcome: Progressing   Problem: Coping: Goal: Level of anxiety will decrease Outcome: Not Progressing

## 2022-10-10 NOTE — Progress Notes (Signed)
PROGRESS NOTE    [name]  UJW:[id number] DOB: February 27, 1990 DOA: 08/22/2022 PCP: [name]   Brief Narrative: 32 year old with past medical history significant for adjustment disorder with mixed anxiety and depressed mood, Autism, bipolar 1 disorder, fragile X syndrome, seizure not on meds, schizoaffective disorder and multiple suicidal attempts in the last year presented to the ED after ingesting numerous medications in a suicide attempt.She reported taking #30 metoprolol 25mg  tablets, #60 Hydroxyzine 25mg , #7 Lexapro 20mg , #21 Gabapentin 600mg , #7 Flexeril 10mg , #7 protonix 40mg  and #7 Prazosin 1mg .  She was bradycardic and hypotensive on EMS arrival which improved with fluids.  Poison control was contacted as she was within 2 hours of injection and was given activated charcoal.  Patient was placed under IVC in the ED.  Poison control also recommended ICU admission for close monitoring of QTc and electrolytes possible pressors.  Patient was intubated and started on IV pressors and received glucagon.  She was a started also on ceftriaxone for pneumonia.  She was subsequently extubated 7/18 and transferred to the floor 7/19.  She has been under [facility] service since 08/25/2022.  Psychiatry continue to follow.  Did recommend inpatient psychiatric hospitalization.  Awaiting placement at [facility].     Assessment & Plan:   Principal Problem:   Suicide attempt [facility]) Active Problems:   Asthma   GAD (generalized anxiety disorder)   Bipolar I disorder, current or most recent episode depressed, with psychotic features (HCC)   Overdose   Drug overdose  1-SI attempt with intentional overdose: -She presented after having taken multiple medications including: # 30 metoprolol 25mg  tablets, # 60 Hydroxyzine 25mg , # 7 Lexapro 20mg , # 21 Gabapentin 600mg , # 7 Flexeril 10mg , # 7 protonix 40mg  and # 7 Prazosin 1mg .  -Poison control was contacted.  Patient received charcoal in the ED.  She  initially required pressors, glucagon, intubation mechanical ventilation. -Currently IVC. -Patient with occasional episode of agitation, that improves after she gets Ativan.  She has had  chest pain during, during episode of agitation and anxiety.  Anxiety and Depression, Schizoaffective Disorder, Adjustment Disorder, bipolar disorder, fibromyalgia and auditory hallucination:  Continue medication per psych -On Community-acquired pneumonia: Completed antibiotic  Essential hypertension: Continue metoprolol and prazosin  Sinus tachycardia: Specially during episodes of anxiety. D-dimer, troponin, TSH normal limits  Metabolic  acidosis: Resolved Will start weaning sodium bicarb,.   Hypomagnesemia: Replaced Deficiency anemia: Continue with iron supplement History of seizure disorder: Not on medications   Asthma.  Nebulizer PRN Hypoalbuminemia encourage oral intake Obesity:  body mass index 36   Estimated body mass index is 36.03 kg/m as calculated from the following:   Height as of this encounter: 5\' 5"  (1.651 m).   Weight as of this encounter: 98.2 kg.   DVT prophylaxis: SCD Code Status: Full code Family Communication: no family at bedside.  Disposition Plan:  Status is: Inpatient Remains inpatient appropriate because: awaiting inpatient psych admission.     Consultants:  [facility] Psych   Procedures:  none  Antimicrobials:    Subjective: She is sleepy, open eyes to voice. Denies chest pain.    Objective: Vitals:   10/08/22 1307 10/08/22 2105 10/09/22 0506 10/10/22 0726  BP: 124/70 129/75 114/71 128/77  Pulse:  (!) 102 100 (!) 124  Resp: 18 16 15 17   Temp: 98.6 F (37 C) 97.6 F (36.4 C) 98.1 F (36.7 C)   TempSrc: Oral Oral Oral   SpO2: 98% 100% 98% 99%  Weight:  Height:        Intake/Output Summary (Last 24 hours) at 10/10/2022 1606 Last data filed at 10/10/2022 1200 Gross per 24 hour  Intake 1200 ml  Output --  Net 1200 ml   Filed Weights    09/01/22 0300 09/02/22 0500 09/03/22 0402  Weight: 98.8 kg 99 kg 98.2 kg    Examination:  General exam: Appears calm and comfortable  Respiratory system: Clear to auscultation. Respiratory effort normal. Cardiovascular system: S1 & S2 heard, RRR. No JVD, murmurs, rubs, gallops or clicks. No pedal edema. Gastrointestinal system: Abdomen is nondistended, soft and nontender. No organomegaly or masses felt. Normal bowel sounds heard. Central nervous system: sleepy/  Extremities: no edema  Data Reviewed: I have personally reviewed following labs and imaging studies  CBC: Recent Labs  Lab 10/08/22 1304  WBC 9.7  NEUTROABS 6.8  HGB 12.3  HCT 39.5  MCV 82.0  PLT 312   Basic Metabolic Panel: Recent Labs  Lab 10/08/22 1304  NA 138  K 3.7  CL 102  CO2 25  GLUCOSE 109*  BUN 9  CREATININE 0.75  CALCIUM 8.8*  MG 1.8   GFR: Estimated Creatinine Clearance: 116.1 mL/min (by C-G formula based on SCr of 0.75 mg/dL). Liver Function Tests: Recent Labs  Lab 10/08/22 1304  AST 18  ALT 19  ALKPHOS 75  BILITOT 0.6  PROT 6.8  ALBUMIN 3.4*   No results for input(s): "LIPASE", "AMYLASE" in the last 168 hours. No results for input(s): "AMMONIA" in the last 168 hours. Coagulation Profile: No results for input(s): "INR", "PROTIME" in the last 168 hours. Cardiac Enzymes: No results for input(s): "CKTOTAL", "CKMB", "CKMBINDEX", "TROPONINI" in the last 168 hours. BNP (last 3 results) No results for input(s): "PROBNP" in the last 8760 hours. HbA1C: No results for input(s): "HGBA1C" in the last 72 hours. CBG: No results for input(s): "GLUCAP" in the last 168 hours. Lipid Profile: No results for input(s): "CHOL", "HDL", "LDLCALC", "TRIG", "CHOLHDL", "LDLDIRECT" in the last 72 hours. Thyroid Function Tests: No results for input(s): "TSH", "T4TOTAL", "FREET4", "T3FREE", "THYROIDAB" in the last 72 hours. Anemia Panel: No results for input(s): "VITAMINB12", "FOLATE", "FERRITIN", "TIBC",  "IRON", "RETICCTPCT" in the last 72 hours. Sepsis Labs: No results for input(s): "PROCALCITON", "LATICACIDVEN" in the last 168 hours.  No results found for this or any previous visit (from the past 240 hour(s)).       Radiology Studies: No results found.      Scheduled Meds:  cholecalciferol  400 Units Oral Daily   cyclobenzaprine  10 mg Oral QHS   diclofenac Sodium  2 g Topical QID   DULoxetine  60 mg Oral Daily   ferrous sulfate  325 mg Oral BID WC   gabapentin  600 mg Oral TID   metoprolol tartrate  12.5 mg Oral BID   mometasone-formoterol  2 puff Inhalation BID   mupirocin cream   Topical Daily   OLANZapine  15 mg Oral Daily   OLANZapine zydis  5 mg Oral BID AC   pantoprazole  40 mg Oral QAC breakfast   prazosin  1 mg Oral QHS   sodium bicarbonate  650 mg Oral TID   Continuous Infusions:  sodium chloride Stopped (08/24/22 1347)     LOS: 49 days    Time spent: 35 minutes    Richard Holz A Braylan Faul, MD [facility]   If 7PM-7AM, please contact night-coverage [email or web address]  10/10/2022, 4:06 PM

## 2022-10-10 NOTE — TOC Progression Note (Addendum)
Transition of Care [facility]) - Progression Note    Patient Details  Name: Velvet Dobrinski MRN: [id number] Date of Birth: [birth year]  Transition of Care [facility]) CM/SW Contact  Lorri Frederick, LCSW Phone Number: 10/10/2022, 3:54 PM  Clinical Narrative:   Message received from Christy/[employer]. [phone].  CSW returned call, no answer, left message.  IVC paperwork renewed: [id number].  [facility] police contacted for service.        Expected Discharge Plan and Services                                               Social Determinants of Health (SDOH) Interventions SDOH Screenings   Food Insecurity: No Food Insecurity (07/29/2022)  Housing: Low Risk  (07/29/2022)  Transportation Needs: No Transportation Needs (07/29/2022)  Recent Concern: Transportation Needs - Unmet Transportation Needs (05/06/2022)  Utilities: Not At Risk (07/29/2022)  Alcohol Screen: Low Risk  (07/29/2022)  Depression (PHQ2-9): Medium Risk (03/10/2022)  Tobacco Use: Medium Risk (09/07/2022)    Readmission Risk Interventions    06/06/2022    3:38 PM  Readmission Risk Prevention Plan  Transportation Screening Complete  Medication Review (RN Care Manager) Complete  PCP or Specialist appointment within 3-5 days of discharge Complete  HRI or Home Care Consult Complete  SW Recovery Care/Counseling Consult Complete  Palliative Care Screening Not Applicable  Skilled Nursing Facility Not Applicable

## 2022-10-10 NOTE — Consult Note (Signed)
[name] Consult Evaluation  Service Date: October 10, 2022 LOS:  LOS: 49 days    Primary Psychiatric Diagnoses   Schizoaffective disorder, bipolar type  GAD  Borderline personality disorder  Assessment  [name] is a 32 y.o. female with a past psychiatric history of borderline personality disorder, schizoaffective disorder bipolar type, and PTSD.  She has had numerous behavioral health admissions and an extensive history of suicide attempts.  Patient presents to [facility] ED for an attempted overdose on her prescription medications.  Briefly, this pt has had ~50 ED visits or psych hospitalizations during 2024; this continues a pattern of an inability to maintain safety in the outpatient setting going back multiple years. The vast majority of her suicide attempts have been via overdose on psychotropic medication. She has failed multiple levels of outpatient management up to and including CST; she has been declined by multiple psychiatric facilities due to futility, and she has been discharged by her CST team as of 8/8. She is currently on the [facility] priority wait list. She is not eligible for ACT services through [facility] due to BPD diagnosis.   We are trying to simplify patient's medication regimen to avoid polypharmacy especially in the setting of her recent overdose. Earlier in hospitalization during a period of improvement (roughly 8/1 to 8/8), we discussed medication management if and when she does return home including closer monitoring, less access to her medications through a locked cabinet, and her ex-boyfriend being more in charge of her medications in which was confirmed with her ex-boyfriend before being dropped by her CST.  After notification of discontinuation from CST services and ineligibility for ACT services, patient had acute worsening of reported hallucinations, suicidality, homicidality and agitation (fear of abandonment is central feature in BPD) and  consideration of community f/u was terminated. Patient has an extensive history of ER visits and hospitalizations in which she has displayed aggression, psychosis, and danger to self even while in proximity of a Recruitment consultant. Patient also shows high level of lethality, intensity, and frequency of suicidal attempts with multiple inpatient psychiatric admissions with little benefit so we continue to recommend [facility]; given wait list will also refer to other psychiatric hospitals. Patient is currently on the [facility] priority wait list and we are working closely with LCSW with bed availability; exploring other state hospital options.   09/04: The patient's behaviors, including chronic suicidal ideations, multiple gestures and failed attempts, impulsivity, and intense, unstable interpersonal relationships, are concerning for borderline personality disorder. Additionally, there are indications of attention-seeking and possibly malingering behaviors, as evidenced by exaggerated symptom presentation and disengagement when discussing discharge plans.   Today, the patient expressed a positive shift in attitude, stating she feels great and is eager to begin planning for discharge. This marks her first active participation in discharge planning. She mentioned that her birthday is approaching, and being home to celebrate would be significantly meaningful for her. [name], a friend of the patient who was present at the bedside today, has been identified and consent was obtained to include him in our discussions regarding her care and discharge planning.  Given her new willingness to improve and engage in her treatment plan, she has been informed that if she can maintain stability by avoiding non-suicidal self-injury (NSSIB), disruptive behaviors, and disrespectful interactions with staff, and effectively manage her emotions, we will consider discharging her home next week. The patient has agreed to these conditions. The patient's  commitment to adhering to the outlined terms will  be closely monitored, and continued support and interventions will be provided to assist her in meeting these goals for a successful transition home.  Diagnoses:  Active Hospital problems: Principal Problem:   Suicide attempt [facility]) Active Problems:   Asthma   GAD (generalized anxiety disorder)   Bipolar I disorder, current or most recent episode depressed, with psychotic features (HCC)   Overdose   Drug overdose    Plan   ## Psychiatric Medication Recommendations:  -- Continue Zyprexa to 5 mg po before lunch and supper for mood stabilization during the day (last changed 8/23) -- Continue Zyprexa 15 mg po nightly for psychosis  -- Continue Cymbalta 60 mg daily -- Continue Gabapentin 600 mg TID for agitation, mood/anxiety -- Continue prazosin 1 mg at bedtime for PTSD  Agitation Protocol -- PRN Zyprexa 10 mg IM for agitation -- PRN Ativan 0.5 mg oral BID PRN for agitation -- PRN Atarax 25 mg TID for anxiety, agitation  ## Medical Decision Making Capacity:   Capacity was not formally addressed during this encounter  ## Further Work-up:  -- Per primary  -- most recent EKG on 09/03/2022 had QtC of 470 EKG reading: Sinus tachycardia Inferior infarct (cited on or before 04-Oct-2022) Anterolateral infarct , age undetermined Abnormal ECG When compared with ECG of 26-Sep-2022 11:59, Anterior infarct is now Present Anterolateral infarct is now Present Nonspecific T wave abnormality no longer evident in Anterior leads I informed attending medical physician of the recent EKG. I ordered a troponin and d dimer.  ## Disposition:  -- We recommend inpatient psychiatric hospitalization after medical hospitalization. Patient has been involuntarily committed on 09/04/2022.  -- IVC renewed on 09/04, next renewal 09/11 -- Patient remains on [facility] priority wait list   ## Behavioral / Environmental:  --  To minimize splitting of staff, assign one  staff person to communicate all information from the team when feasible. -- Apply the established behavioral rules and protocols consistently throughout hospitalization (see nursing care order for details). Ensure that all staff members are aware of and adhere to the same guidelines to avoid confusion and mixed messages.  ## Safety and Observation Level:  - Based on my clinical evaluation, I estimate the patient to be at elevated risk of self harm in the current setting - At this time, we recommend continuing a 1:1 level of observation. This decision is based on my review of the chart including patient's history and current presentation, interview of the patient, mental status examination, and consideration of suicide risk including evaluating suicidal ideation, plan, intent, suicidal or self-harm behaviors, risk factors, and protective factors. This judgment is based on our ability to directly address suicide risk, implement suicide prevention strategies and develop a safety plan while the patient is in the clinical setting. Please contact our team if there is a concern that risk level has changed.  Suicide risk assessment  Patient has following modifiable risk factors for suicide: active suicidal ideation, under treated depression , recklessness, and lack of access to outpatient mental health resources, which we are addressing by continuing adequate safety precautions while she is hospitalized and adjusting medications   Patient has following non-modifiable or demographic risk factors for suicide: early widowhood, history of suicide attempt, history of self harm behavior, and psychiatric hospitalization  Patient has the following protective factors against suicide: Supportive friends  Thank you for this consult request. Recommendations have been communicated to the primary team.  We will follow at this time.  Will see every M-W-F.  Maryagnes Amos, FNP   Total Time Spent in Direct  Patient Care:  I personally spent 30 minutes on the unit in direct patient care. The direct patient care time included face-to-face time with the patient, reviewing the patient's chart, communicating with other professionals, and coordinating care. Greater than 50% of this time was spent in counseling or coordinating care with the patient regarding goals of hospitalization, psycho-education, and discharge planning needs.  Psychiatric and Social History   Relevant Aspects of Hospital Course:  Admitted on 08/22/2022 for suicide attempt by overdose on her prescription medication.   Patient Report:  No overnight events noted. Per nursing note, no restraints, calm and cooperative throughout the night.   PRN meds given 09/03: atarax 1x, ativan 1x, Olanzapine  Patient seen and assessed by psychiatric provider, friend [name] at bedside. She states she is having a good day. She states yesterday " was not a good day but I was able to get myself together. " She reports that the voices were present yesterday but not today, and is currently denying auditory and visual hallucinations. She denies suicidal ideations, homicidal ideations and hallucinations. She tells me she is ready to go home soon as her birthday is coming up. She is more perky and cooperative, showing much more engagement than previous encounters.   Collateral 8/29: Spoke to [name] and he stated that he visited [place] earlier this week in which she asked him to throw away her books because she was going to [facility].  8/14: called [name], no response 8/12: tried to call [name] with no response.  8/9: called Debra with CST - stated she would be happy to conduct a termination session with Arna if she reached out . She has not been called by [name] during this hospitalization and Loletta has refused offered termination session.  8/9: called Dr. Pershing Proud with [facility] to discuss pt's recent decompensation  8/5: spoke with [name]  [employer] in to  note above   Past Psychiatric History: Previous Psych Diagnoses: Schizoaffective disorder bipolar type, Anxiety, Autism spectrum disorder, PTSD, Borderline personality disorder Prior inpatient treatment: multiple, 15 ED visits and 4 inpatient admissions since 06/2022 (~50 total in 2024), admitted to [facility] from 07/29/22-08/02/2022, has been to [facility] for eight months in 2016 which led to period of stability, and was discharged from inpatient facility within a week of her OD Current/prior outpatient treatment: [facility], CST team twice a week Prior rehab hx: denies multiple Psychotherapy hx: [facility] History of suicide: 20-30 times by overdosing on pills, cutting or drinking 409 disinfectant History of homicide or aggression: denies Psychiatric medication history:  Previously trials of Tegretol, Haldol, Clozaril, Depakote, Invega, Lamictal, Seroquel, Asenapine, Abilify, vraylar (worsened suicidal thoughts), Lexapro, Zoloft  Neuromodulation history: denies  Social History:  Marital status: Widowed Children: 14 year old daughter Lives: with ex-boyfriend [name], in two story home Employment: Disability, 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  Access to weapons: None   Exam Findings   Psychiatric Specialty Exam:  Presentation  General Appearance: Appropriate for Environment; Casual  Eye Contact:Good  Speech:Clear and Coherent; Normal Rate  Speech Volume:Normal  Handedness:Right   Mood and Affect  Mood:-- Depressed  Affect:-- Congruent     Thought Process  Thought Processes:Coherent; Linear  Descriptions of Associations:Intact  Orientation:Full (Time, Place and Person)  Thought Content:Logical  History of Schizophrenia/Schizoaffective disorder: Yes  Duration of Psychotic Symptoms:Greater than six months  Hallucinations: Denies  Ideas of Reference:None  Suicidal Thoughts: Denies  Homicidal Thoughts:  Denies   Sensorium  Memory:Immediate Fair; Recent Good; Remote Good  Judgment:Fair  Insight: Photographer  Attention Span:Fair  Recall:Good  Fund of Knowledge:Good  Language:Good   Psychomotor Activity  Psychomotor Activity: Normal  Assets  Assets:Desire for Improvement; Financial Resources/Insurance; Housing; Social Support; Resilience; Physical Health; Communication Skills   Sleep  Sleep: Fair    Physical Exam: Vital signs:  Pulse Rate:  [124] 124 (09/04 0726) Resp:  [17] 17 (09/04 0726) BP: (128)/(77) 128/77 (09/04 0726) SpO2:  [99 %] 99 % (09/04 0726) Physical Exam Vitals and nursing note reviewed. Exam conducted with a chaperone present.  Constitutional:      General: She is not in acute distress.    Appearance: She is obese. She is not ill-appearing.  HENT:     Head: Normocephalic.  Cardiovascular:     Rate and Rhythm: Normal rate.  Pulmonary:     Effort: Pulmonary effort is normal. No respiratory distress.  Neurological:     General: No focal deficit present.     Mental Status: She is alert.  Psychiatric:        Attention and Perception: Attention normal.        Speech: Speech normal.     Blood pressure 128/77, pulse (!) 124, temperature 98.1 F (36.7 C), temperature source Oral, resp. rate 17, height 5\' 5"  (1.651 m), weight 98.2 kg, last menstrual period 08/08/2022, SpO2 99%. Body mass index is 36.03 kg/m.   Other History   These have been pulled in through the EMR, reviewed, and updated if appropriate.   Family History:  The patient's family history includes Asthma in her father; Mental illness in her father; PDD in her brother; Seizures in her brother.  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 fragile X syndrome    Chronic constipation    Depression    Drug-seeking behavior    Essential tremor    Headache    Ineffective individual coping 05/16/2022   Insomnia 01/12/2022   Intentional drug overdose (HCC) 06/05/2022   Neuromuscular disorder (HCC)    Normocytic anemia 06/05/2022   Overdose 07/22/2017   Overdose of acetaminophen 07/2017   and other meds   Overdose, intentional self-harm, initial encounter (HCC) 07/20/2021   Paranoia (HCC) 04/22/2021   Personality disorder (HCC)    Purposeful non-suicidal drug ingestion (HCC) 06/27/2021   Schizo-affective psychosis (HCC)    Schizoaffective disorder (HCC) 07/29/2022   Schizoaffective disorder, bipolar type (HCC)    Seizures (HCC)    Last seizure December 2017   Skin erythema 04/27/2022   Sleep apnea    Suicidal behavior 07/25/2021   Suicidal ideation    Suicide (HCC) 07/01/2021   Suicide attempt (HCC) 07/04/2021    Surgical History: Past Surgical History:  Procedure Laterality Date   MOUTH SURGERY  2009 or 2010    Medications:   Current Facility-Administered Medications:    0.9 %  sodium chloride infusion, 250 mL, Intravenous, Continuous, Gonfa, Taye T, MD, Stopped at 08/24/22 1347   albuterol (PROVENTIL) (2.5 MG/3ML) 0.083% nebulizer solution 3  mL, 3 mL, Inhalation, Q6H PRN, Alanda Slim, Taye T, MD   cholecalciferol (VITAMIN D3) 10 MCG (400 UNIT) tablet 400 Units, 400 Units, Oral, Daily, Cinderella, Margaret A, 400 Units at 10/10/22 0914   cyclobenzaprine (FLEXERIL) tablet 10 mg, 10 mg, Oral, QHS, Gonfa, Taye T, MD, 10 mg at 10/09/22 2100   diclofenac Sodium (VOLTAREN) 1 % topical gel 2 g, 2 g, Topical, QID, Gonfa, Taye T, MD, 2 g at 10/10/22 1419   DULoxetine (CYMBALTA) DR capsule 60 mg, 60 mg, Oral, Daily, Akintayo, Mojeed, MD, 60 mg at 10/10/22 0914   ferrous sulfate tablet 325 mg, 325 mg, Oral, BID WC,  Gonfa, Taye T, MD, 325 mg at 10/10/22 0914   gabapentin (NEURONTIN) capsule 600 mg, 600 mg, Oral, TID, Alanda Slim, Taye T, MD, 600 mg at 10/10/22 0914   hydrOXYzine (ATARAX) tablet 25 mg, 25 mg, Oral, TID PRN, Alanda Slim, Taye T, MD, 25 mg at 10/09/22 0100   ibuprofen (ADVIL) tablet 600 mg, 600 mg, Oral, TID PRN, Alanda Slim, Taye T, MD, 600 mg at 10/10/22 1419   LORazepam (ATIVAN) tablet 0.5 mg, 0.5 mg, Oral, BID PRN, Alanda Slim, Taye T, MD, 0.5 mg at 10/09/22 1526   metoprolol tartrate (LOPRESSOR) tablet 12.5 mg, 12.5 mg, Oral, BID, Vann, Jessica U, DO, 12.5 mg at 10/10/22 0914   mometasone-formoterol (DULERA) 200-5 MCG/ACT inhaler 2 puff, 2 puff, Inhalation, BID, Alanda Slim, Taye T, MD, 2 puff at 10/09/22 2105   mupirocin cream (BACTROBAN) 2 %, , Topical, Daily, Marguerita Merles Gilman, DO, Given at 10/09/22 1253   OLANZapine (ZYPREXA) injection 10 mg, 10 mg, Intramuscular, Daily PRN, Kizzie Ide B, MD, 10 mg at 10/07/22 0141   OLANZapine (ZYPREXA) tablet 15 mg, 15 mg, Oral, Daily, Cinderella, Margaret A, 15 mg at 10/09/22 2100   OLANZapine zydis (ZYPREXA) disintegrating tablet 5 mg, 5 mg, Oral, BID AC, Mariel Craft, MD, 5 mg at 10/10/22 1153   ondansetron (ZOFRAN-ODT) disintegrating tablet 4 mg, 4 mg, Oral, Q8H PRN, Sheikh, Omair Latif, DO, 4 mg at 10/09/22 0981   Oral care mouth rinse, 15 mL, Mouth Rinse, PRN, Osvaldo Shipper, MD   pantoprazole (PROTONIX) EC tablet 40 mg, 40 mg, Oral, QAC breakfast, Gonfa, Taye T, MD, 40 mg at 10/10/22 0914   polyethylene glycol (MIRALAX / GLYCOLAX) packet 17 g, 17 g, Oral, BID PRN, Alanda Slim, Taye T, MD, 17 g at 10/05/22 2043   prazosin (MINIPRESS) capsule 1 mg, 1 mg, Oral, QHS, Gonfa, Taye T, MD, 1 mg at 10/09/22 2100   senna-docusate (Senokot-S) tablet 1 tablet, 1 tablet, Oral, BID PRN, Candelaria Stagers T, MD, 1 tablet at 10/06/22 2041   sodium bicarbonate tablet 650 mg, 650 mg, Oral, TID, Candelaria Stagers T, MD, 650 mg at 10/10/22 0914  Allergies: Allergies  Allergen Reactions   Bee Venom  Anaphylaxis   Coconut Flavor Anaphylaxis and Rash   Fish Allergy Anaphylaxis   Geodon [Ziprasidone Hcl] Other (See Comments)    Pt states that this medication causes paralysis of the mouth.     Haloperidol And Related Other (See Comments)    Pt states that this medication causes paralysis of the mouth, jaw locks up   Lithobid [Lithium] Other (See Comments)    Seizure-like activity    Roxicodone [Oxycodone] Other (See Comments)    Hallucinations    Seroquel [Quetiapine] Other (See Comments)    Severe drowsiness - Pt currently taking 12.5 mg BID, she is ok with this dose   Shellfish Allergy Anaphylaxis   Phenergan [Promethazine  Hcl] Other (See Comments)    Chest pain     Prilosec [Omeprazole] Nausea And Vomiting and Other (See Comments)    Pt can take protonix with no problems    Sulfa Antibiotics Other (See Comments)    Chest pain    Tegretol [Carbamazepine] Nausea And Vomiting   Prozac [Fluoxetine] Other (See Comments)    Increased Depression and Suicidal thoughts   Tape Other (See Comments)    Skin tears, can only tolerate paper tape.   Tylenol [Acetaminophen] Rash and Other (See Comments)    Rash on face

## 2022-10-11 DIAGNOSIS — T50902A Poisoning by unspecified drugs, medicaments and biological substances, intentional self-harm, initial encounter: Secondary | ICD-10-CM | POA: Diagnosis not present

## 2022-10-11 DIAGNOSIS — F411 Generalized anxiety disorder: Secondary | ICD-10-CM | POA: Diagnosis not present

## 2022-10-11 DIAGNOSIS — F315 Bipolar disorder, current episode depressed, severe, with psychotic features: Secondary | ICD-10-CM | POA: Diagnosis not present

## 2022-10-11 DIAGNOSIS — T1491XA Suicide attempt, initial encounter: Secondary | ICD-10-CM | POA: Diagnosis not present

## 2022-10-11 LAB — BASIC METABOLIC PANEL
Anion gap: 9 (ref 5–15)
BUN: 12 mg/dL (ref 6–20)
CO2: 23 mmol/L (ref 22–32)
Calcium: 9 mg/dL (ref 8.9–10.3)
Chloride: 103 mmol/L (ref 98–111)
Creatinine, Ser: 0.76 mg/dL (ref 0.44–1.00)
GFR, Estimated: >60 mL/min (ref >60)
Glucose, Bld: 147 mg/dL — ABNORMAL HIGH (ref 70–99)
Potassium: 3.9 mmol/L (ref 3.5–5.1)
Sodium: 135 mmol/L (ref 135–145)

## 2022-10-11 LAB — CBC
HCT: 37.8 % (ref 36.0–46.0)
Hemoglobin: 11.7 g/dL — ABNORMAL LOW (ref 12.0–15.0)
MCH: 25.2 pg — ABNORMAL LOW (ref 26.0–34.0)
MCHC: 31 g/dL (ref 30.0–36.0)
MCV: 81.5 fL (ref 80.0–100.0)
Platelets: 286 10*3/uL (ref 150–400)
RBC: 4.64 MIL/uL (ref 3.87–5.11)
RDW: 15.7 % — ABNORMAL HIGH (ref 11.5–15.5)
WBC: 10 10*3/uL (ref 4.0–10.5)
nRBC: 0 % (ref 0.0–0.2)

## 2022-10-11 LAB — MAGNESIUM: Magnesium: 1.7 mg/dL (ref 1.7–2.4)

## 2022-10-11 NOTE — Progress Notes (Signed)
PROGRESS NOTE    [name]  ZOX:[id number] DOB: 29-Oct-1990 DOA: 08/22/2022 PCP: [name]   Brief Narrative: 32 year old with past medical history significant for adjustment disorder with mixed anxiety and depressed mood, Autism, bipolar 1 disorder, fragile X syndrome, seizure not on meds, schizoaffective disorder and multiple suicidal attempts in the last year presented to the ED after ingesting numerous medications in a suicide attempt.She reported taking #30 metoprolol 25mg  tablets, #60 Hydroxyzine 25mg , #7 Lexapro 20mg , #21 Gabapentin 600mg , #7 Flexeril 10mg , #7 protonix 40mg  and #7 Prazosin 1mg .  She was bradycardic and hypotensive on EMS arrival which improved with fluids.  Poison control was contacted as she was within 2 hours of injection and was given activated charcoal.  Patient was placed under IVC in the ED.  Poison control also recommended ICU admission for close monitoring of QTc and electrolytes possible pressors.  Patient was intubated and started on IV pressors and received glucagon.  She was a started also on ceftriaxone for pneumonia.  She was subsequently extubated 7/18 and transferred to the floor 7/19.  She has been under [facility] service since 08/25/2022.  Psychiatry continue to follow.  Did recommend inpatient psychiatric hospitalization.  Awaiting placement at [facility].     Assessment & Plan:   Principal Problem:   Suicide attempt [facility]) Active Problems:   Asthma   GAD (generalized anxiety disorder)   Bipolar I disorder, current or most recent episode depressed, with psychotic features (HCC)   Overdose   Drug overdose  1-SI attempt with intentional overdose: -She presented after having taken multiple medications including: # 30 metoprolol 25mg  tablets, # 60 Hydroxyzine 25mg , # 7 Lexapro 20mg , # 21 Gabapentin 600mg , # 7 Flexeril 10mg , # 7 protonix 40mg  and # 7 Prazosin 1mg .  -Poison control was contacted.  Patient received charcoal in the ED.  She  initially required pressors, glucagon, intubation mechanical ventilation. -Currently IVC. -Patient with occasional episode of agitation, that improves after she gets Ativan.  She has had  chest pain during, during episode of agitation and anxiety.  Anxiety and Depression, Schizoaffective Disorder, Adjustment Disorder, bipolar disorder, fibromyalgia and auditory hallucination:  Continue medication per psych -she has been calm today.   Community-acquired pneumonia: Completed antibiotic  Essential hypertension: Continue metoprolol and prazosin  Sinus tachycardia: Specially during episodes of anxiety. D-dimer, troponin, TSH normal limits  Metabolic  acidosis: Resolved Started weaning sodium bicarb,.   Hypomagnesemia: Replaced Deficiency anemia: Continue with iron supplement History of seizure disorder: Not on medications   Asthma.  Nebulizer PRN Hypoalbuminemia encourage oral intake Obesity:  body mass index 36   Estimated body mass index is 36.03 kg/m as calculated from the following:   Height as of this encounter: 5\' 5"  (1.651 m).   Weight as of this encounter: 98.2 kg.   DVT prophylaxis: SCD Code Status: Full code Family Communication: no family at bedside.  Disposition Plan:  Status is: Inpatient Remains inpatient appropriate because: awaiting inpatient psych admission.     Consultants:  [facility] Psych   Procedures:  none  Antimicrobials:    Subjective: She is sleepy, she ate breakfast, no new complaints.   Objective: Vitals:   10/10/22 0726 10/10/22 1732 10/10/22 2101 10/11/22 0931  BP: 128/77 108/73 132/72 136/81  Pulse: (!) 124 (!) 101  (!) 110  Resp: 17 17  17   Temp:  98.2 F (36.8 C)  98 F (36.7 C)  TempSrc:  Oral  Oral  SpO2: 99% 99%  99%  Weight:  Height:        Intake/Output Summary (Last 24 hours) at 10/11/2022 1531 Last data filed at 10/11/2022 1130 Gross per 24 hour  Intake 360 ml  Output --  Net 360 ml   Filed Weights   09/01/22  0300 09/02/22 0500 09/03/22 0402  Weight: 98.8 kg 99 kg 98.2 kg    Examination:  General exam: Alert Respiratory system: CTA Cardiovascular system: S 1, S 2 RRR   Data Reviewed: I have personally reviewed following labs and imaging studies  CBC: Recent Labs  Lab 10/08/22 1304  WBC 9.7  NEUTROABS 6.8  HGB 12.3  HCT 39.5  MCV 82.0  PLT 312   Basic Metabolic Panel: Recent Labs  Lab 10/08/22 1304  NA 138  K 3.7  CL 102  CO2 25  GLUCOSE 109*  BUN 9  CREATININE 0.75  CALCIUM 8.8*  MG 1.8   GFR: Estimated Creatinine Clearance: 116.1 mL/min (by C-G formula based on SCr of 0.75 mg/dL). Liver Function Tests: Recent Labs  Lab 10/08/22 1304  AST 18  ALT 19  ALKPHOS 75  BILITOT 0.6  PROT 6.8  ALBUMIN 3.4*   No results for input(s): "LIPASE", "AMYLASE" in the last 168 hours. No results for input(s): "AMMONIA" in the last 168 hours. Coagulation Profile: No results for input(s): "INR", "PROTIME" in the last 168 hours. Cardiac Enzymes: No results for input(s): "CKTOTAL", "CKMB", "CKMBINDEX", "TROPONINI" in the last 168 hours. BNP (last 3 results) No results for input(s): "PROBNP" in the last 8760 hours. HbA1C: No results for input(s): "HGBA1C" in the last 72 hours. CBG: No results for input(s): "GLUCAP" in the last 168 hours. Lipid Profile: No results for input(s): "CHOL", "HDL", "LDLCALC", "TRIG", "CHOLHDL", "LDLDIRECT" in the last 72 hours. Thyroid Function Tests: No results for input(s): "TSH", "T4TOTAL", "FREET4", "T3FREE", "THYROIDAB" in the last 72 hours. Anemia Panel: No results for input(s): "VITAMINB12", "FOLATE", "FERRITIN", "TIBC", "IRON", "RETICCTPCT" in the last 72 hours. Sepsis Labs: No results for input(s): "PROCALCITON", "LATICACIDVEN" in the last 168 hours.  No results found for this or any previous visit (from the past 240 hour(s)).       Radiology Studies: No results found.      Scheduled Meds:  cholecalciferol  400 Units Oral  Daily   cyclobenzaprine  10 mg Oral QHS   diclofenac Sodium  2 g Topical QID   DULoxetine  60 mg Oral Daily   ferrous sulfate  325 mg Oral BID WC   gabapentin  600 mg Oral TID   metoprolol tartrate  12.5 mg Oral BID   mometasone-formoterol  2 puff Inhalation BID   mupirocin cream   Topical Daily   OLANZapine  15 mg Oral Daily   OLANZapine zydis  5 mg Oral BID AC   pantoprazole  40 mg Oral QAC breakfast   prazosin  1 mg Oral QHS   sodium bicarbonate  650 mg Oral Daily   Continuous Infusions:  sodium chloride Stopped (08/24/22 1347)     LOS: 50 days    Time spent: 35 minutes    Dellar Traber A Ebonique Hallstrom, MD [facility]   If 7PM-7AM, please contact night-coverage [email or web address]  10/11/2022, 3:31 PM

## 2022-10-11 NOTE — Plan of Care (Signed)
  Problem: Clinical Measurements: Goal: Will remain free from infection Outcome: Progressing   Problem: Coping: Goal: Level of anxiety will decrease Outcome: Progressing   Problem: Skin Integrity: Goal: Risk for impaired skin integrity will decrease Outcome: Progressing   Problem: Coping: Goal: Ability to adjust to condition or change in health will improve Outcome: Progressing

## 2022-10-11 NOTE — Consult Note (Signed)
[name] Consult Evaluation  Service Date: October 11, 2022 LOS:  LOS: 50 days    Primary Psychiatric Diagnoses   Schizoaffective disorder, bipolar type  GAD  Borderline personality disorder  Assessment  [name] is a 32 y.o. female with a past psychiatric history of borderline personality disorder, schizoaffective disorder bipolar type, and PTSD.  She has had numerous behavioral health admissions and an extensive history of suicide attempts.  Patient presents to [facility] ED for an attempted overdose on her prescription medications.  Briefly, this pt has had ~50 ED visits or psych hospitalizations during 2024; this continues a pattern of an inability to maintain safety in the outpatient setting going back multiple years. The vast majority of her suicide attempts have been via overdose on psychotropic medication. She has failed multiple levels of outpatient management up to and including CST; she has been declined by multiple psychiatric facilities due to futility, and she has been discharged by her CST team as of 8/8. She is currently on the [facility] priority wait list. She is not eligible for ACT services through [facility] due to BPD diagnosis.   We are trying to simplify patient's medication regimen to avoid polypharmacy especially in the setting of her recent overdose. Earlier in hospitalization during a period of improvement (roughly 8/1 to 8/8), we discussed medication management if and when she does return home including closer monitoring, less access to her medications through a locked cabinet, and her ex-boyfriend being more in charge of her medications in which was confirmed with her ex-boyfriend before being dropped by her CST.  After notification of discontinuation from CST services and ineligibility for ACT services, patient had acute worsening of reported hallucinations, suicidality, homicidality and agitation (fear of abandonment is central feature in BPD) and  consideration of community f/u was terminated. Patient has an extensive history of ER visits and hospitalizations in which she has displayed aggression, psychosis, and danger to self even while in proximity of a Recruitment consultant. Patient also shows high level of lethality, intensity, and frequency of suicidal attempts with multiple inpatient psychiatric admissions with little benefit so we continue to recommend [facility]; given wait list will also refer to other psychiatric hospitals. Patient is currently on the [facility] priority wait list and we are working closely with LCSW with bed availability; exploring other state hospital options.   09/05: The patient's behaviors, including chronic suicidal ideations, multiple gestures and failed attempts, impulsivity, and intense, unstable interpersonal relationships, are concerning for borderline personality disorder. Additionally, there are indications of attention-seeking and possibly malingering behaviors, as evidenced by exaggerated symptom presentation and disengagement when discussing discharge plans.  Today, the patient was sleepy upon exam.  She noted that she has not had any behavioral outbursts of concern, and she remains goal oriented on discharge planning.  She walked twice yesterday with sitter, and she is encouraged to continue activity as tolereated.   Fayrene Fearing, a friend of the patient who was present at the bedside on 10/10/2022 has been identified and consent was obtained to include him in our discussions regarding her care and discharge planning.  Given her new willingness to improve and engage in her treatment plan, she has been informed that if she can maintain stability by avoiding non-suicidal self-injury (NSSIB), disruptive behaviors, and disrespectful interactions with staff, and effectively manage her emotions, we will consider discharging her home next week. The patient has agreed to these conditions. The patient's commitment to adhering to the outlined terms  will be closely  monitored, and continued support and interventions will be provided to assist her in meeting these goals for a successful transition home.  Diagnoses:  Active Hospital problems: Principal Problem:   Suicide attempt [facility]) Active Problems:   Asthma   GAD (generalized anxiety disorder)   Bipolar I disorder, current or most recent episode depressed, with psychotic features (HCC)   Overdose   Drug overdose    Plan   ## Psychiatric Medication Recommendations:  -- Continue Zyprexa to 5 mg po before lunch and supper for mood stabilization during the day (last changed 8/23) -- Continue Zyprexa 15 mg po nightly for psychosis  -- Continue Cymbalta 60 mg daily -- Continue Gabapentin 600 mg TID for agitation, mood/anxiety -- Continue prazosin 1 mg at bedtime for PTSD  Agitation Protocol -- PRN Zyprexa 10 mg IM for agitation -- PRN Ativan 0.5 mg oral BID PRN for agitation -- PRN Atarax 25 mg TID for anxiety, agitation  ## Medical Decision Making Capacity:   Capacity was not formally addressed during this encounter  ## Further Work-up:  -- Per primary  -- most recent EKG on 10/04/2022 had QtC of 470 EKG reading: Sinus tachycardia Inferior infarct (cited on or before 04-Oct-2022) Anterolateral infarct , age undetermined Abnormal ECG When compared with ECG of 26-Sep-2022 11:59, Anterior infarct is now Present Anterolateral infarct is now Present Nonspecific T wave abnormality no longer evident in Anterior leads attending medical physician aware of the recent EKG.  ## Disposition:  -- We recommend inpatient psychiatric hospitalization after medical hospitalization. Patient has been involuntarily committed on 09/04/2022.  -- IVC renewed on 09/04, next renewal 09/11 -- Patient remains on [facility] priority wait list   ## Behavioral / Environmental:  --  To minimize splitting of staff, assign one staff person to communicate all information from the team when feasible. -- Apply  the established behavioral rules and protocols consistently throughout hospitalization (see nursing care order for details). Ensure that all staff members are aware of and adhere to the same guidelines to avoid confusion and mixed messages.  ## Safety and Observation Level:  - Based on my clinical evaluation, I estimate the patient to be at elevated risk of self harm in the current setting - At this time, we recommend continuing a 1:1 level of observation. This decision is based on my review of the chart including patient's history and current presentation, interview of the patient, mental status examination, and consideration of suicide risk including evaluating suicidal ideation, plan, intent, suicidal or self-harm behaviors, risk factors, and protective factors. This judgment is based on our ability to directly address suicide risk, implement suicide prevention strategies and develop a safety plan while the patient is in the clinical setting. Please contact our team if there is a concern that risk level has changed.  Suicide risk assessment  Patient has following modifiable risk factors for suicide: active suicidal ideation, under treated depression , recklessness, and lack of access to outpatient mental health resources, which we are addressing by continuing adequate safety precautions while she is hospitalized and adjusting medications   Patient has following non-modifiable or demographic risk factors for suicide: early widowhood, history of suicide attempt, history of self harm behavior, and psychiatric hospitalization  Patient has the following protective factors against suicide: Supportive friends  Thank you for this consult request. Recommendations have been communicated to the primary team.  We will follow at this time.     Mariel Craft, MD   Total Time Spent in Direct Patient  Care:  I personally spent 35 minutes on the unit in direct patient care. The direct patient care time  included face-to-face time with the patient, reviewing the patient's chart, communicating with other professionals, and coordinating care. Greater than 50% of this time was spent in counseling or coordinating care with the patient regarding goals of hospitalization, psycho-education, and discharge planning needs.  Psychiatric and Social History   Relevant Aspects of Hospital Course:  Admitted on 08/22/2022 for suicide attempt by overdose on her prescription medication.   Patient Report:  No overnight events noted. Per nursing note, no restraints, calm and cooperative throughout the night.   PRN meds given 09/03: atarax 1x, ativan 1x, Olanzapine  Patient seen and assessed by psychiatric provider. Patient is currently denying auditory and visual hallucinations. She denies suicidal ideations, homicidal ideations and hallucinations. She tells me she is working towards plan to discharge to home.  Collateral 9/4:  8/29: Spoke to Fayrene Fearing and he stated that he visited Emrey earlier this week in which she asked him to throw away her books because she was going to [facility].  8/14: called Fayrene Fearing, no response 8/12: tried to call Fayrene Fearing with no response.  8/9: called Debra with CST - stated she would be happy to conduct a termination session with Sy[facility] if she reached out . She has not been called by [name] during this hospitalization and Louellen has refused offered termination session.  8/9: called Dr. Pershing Proud with [facility] to discuss pt's recent decompensation  8/5: spoke with Fayrene Fearing  [employer] in to note above   Past Psychiatric History: Previous Psych Diagnoses: Schizoaffective disorder bipolar type, Anxiety, Autism spectrum disorder, PTSD, Borderline personality disorder Prior inpatient treatment: multiple, 15 ED visits and 4 inpatient admissions since 06/2022 (~50 total in 2024), admitted to [facility]  from 07/29/22-08/02/2022, has been to [facility] for eight months in 2016 which led to  period of stability, and was discharged from inpatient facility within a week of her OD Current/prior outpatient treatment: [facility], CST team twice a week Prior rehab hx: denies multiple Psychotherapy hx: [facility] History of suicide: 20-30 times by overdosing on pills, cutting or drinking 409 disinfectant History of homicide or aggression: denies Psychiatric medication history:  Previously trials of Tegretol, Haldol, Clozaril, Depakote, Invega, Lamictal, Seroquel, Asenapine, Abilify, vraylar (worsened suicidal thoughts), Lexapro, Zoloft  Neuromodulation history: denies  Social History:  Marital status: Widowed Children: 45 year old daughter Lives: with ex-boyfriend Fayrene Fearing, in two story home Employment: Disability, 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  Access to weapons: None   Exam Findings   Psychiatric Specialty Exam:  Presentation  General Appearance: Appropriate for Environment; Casual  Eye Contact:Minimal (patient resting)  Speech:Clear and Coherent; Normal Rate  Speech Volume:Decreased  Handedness:Right   Mood and Affect  Mood:-- Dysphoric, states "OK"  Affect:-- Congruent     Thought Process  Thought Processes:Coherent; Linear  Descriptions of Associations:Intact  Orientation:Full (Time, Place and Person)  Thought Content:Logical  History of Schizophrenia/Schizoaffective disorder: Yes  Duration of Psychotic Symptoms:Greater than six months  Hallucinations: Denies  Ideas of Reference:None  Suicidal Thoughts: Denies  Homicidal Thoughts: Denies   Sensorium  Memory:Immediate Fair; Recent Good; Remote Good  Judgment:Fair  Insight: Fair   Art therapist  Concentration:Good  Attention Span:Fair  Recall:Good  Fund of Knowledge:Good  Language:Good   Psychomotor Activity  Psychomotor Activity: Normal  Assets  Assets:Desire for Improvement; Financial Resources/Insurance; Housing;  Social Support; Resilience; Physical  Health; Communication Skills   Sleep  Sleep: Fair    Physical Exam: Vital signs:  Temp:  [98 F (36.7 C)-98.2 F (36.8 C)] 98 F (36.7 C) (09/05 0931) Pulse Rate:  [101-110] 110 (09/05 0931) Resp:  [17] 17 (09/05 0931) BP: (108-136)/(72-81) 136/81 (09/05 0931) SpO2:  [99 %] 99 % (09/05 0931) Physical Exam Vitals and nursing note reviewed. Exam conducted with a chaperone present.  Constitutional:      General: She is not in acute distress.    Appearance: She is obese. She is not ill-appearing.  HENT:     Head: Normocephalic.  Cardiovascular:     Rate and Rhythm: Normal rate.  Pulmonary:     Effort: Pulmonary effort is normal. No respiratory distress.  Neurological:     General: No focal deficit present.     Mental Status: She is alert.  Psychiatric:        Attention and Perception: Attention normal.        Speech: Speech normal.     Blood pressure 136/81, pulse (!) 110, temperature 98 F (36.7 C), temperature source Oral, resp. rate 17, height 5\' 5"  (1.651 m), weight 98.2 kg, last menstrual period 08/08/2022, SpO2 99%. Body mass index is 36.03 kg/m.   Other History   These have been pulled in through the EMR, reviewed, and updated if appropriate.   Family History:  The patient's family history includes Asthma in her father; Mental illness in her father; PDD in her brother; Seizures in her brother.  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    Chronic constipation    Depression    Drug-seeking behavior    Essential tremor    Headache    Ineffective individual coping 05/16/2022   Insomnia  01/12/2022   Intentional drug overdose (HCC) 06/05/2022   Neuromuscular disorder (HCC)    Normocytic anemia 06/05/2022   Overdose 07/22/2017   Overdose of acetaminophen 07/2017   and other meds   Overdose, intentional self-harm, initial encounter (HCC) 07/20/2021   Paranoia (HCC) 04/22/2021   Personality disorder (HCC)    Purposeful non-suicidal drug ingestion (HCC) 06/27/2021   Schizo-affective psychosis (HCC)    Schizoaffective disorder (HCC) 07/29/2022   Schizoaffective disorder, bipolar type (HCC)    Seizures (HCC)    Last seizure December 2017   Skin erythema 04/27/2022   Sleep apnea    Suicidal behavior 07/25/2021   Suicidal ideation    Suicide (HCC) 07/01/2021   Suicide attempt (HCC) 07/04/2021    Surgical History: Past Surgical History:  Procedure Laterality Date   MOUTH SURGERY  2009 or 2010    Medications:   Current Facility-Administered Medications:    0.9 %  sodium chloride infusion, 250 mL, Intravenous, Continuous, Gonfa, Taye T, MD, Stopped at 08/24/22 1347   albuterol (PROVENTIL) (2.5 MG/3ML) 0.083% nebulizer solution 3 mL, 3 mL, Inhalation, Q6H PRN, Gonfa, Taye T, MD   cholecalciferol (VITAMIN D3) 10 MCG (400 UNIT) tablet 400 Units, 400 Units, Oral, Daily, Cinderella, Margaret A, 400 Units at 10/11/22 0920   cyclobenzaprine (FLEXERIL) tablet 10 mg, 10 mg, Oral, QHS, Gonfa, Taye T, MD, 10  mg at 10/10/22 2055   diclofenac Sodium (VOLTAREN) 1 % topical gel 2 g, 2 g, Topical, QID, Gonfa, Taye T, MD, 2 g at 10/11/22 1405   DULoxetine (CYMBALTA) DR capsule 60 mg, 60 mg, Oral, Daily, Akintayo, Mojeed, MD, 60 mg at 10/11/22 0920   ferrous sulfate tablet 325 mg, 325 mg, Oral, BID WC, Gonfa, Taye T, MD, 325 mg at 10/11/22 0920   gabapentin (NEURONTIN) capsule 600 mg, 600 mg, Oral, TID, Alanda Slim, Taye T, MD, 600 mg at 10/11/22 1605   hydrOXYzine (ATARAX) tablet 25 mg, 25 mg, Oral, TID PRN, Candelaria Stagers T, MD, 25 mg at 10/10/22 2055   ibuprofen (ADVIL) tablet 600 mg, 600 mg,  Oral, TID PRN, Alanda Slim, Taye T, MD, 600 mg at 10/10/22 1419   LORazepam (ATIVAN) tablet 0.5 mg, 0.5 mg, Oral, BID PRN, Alanda Slim, Taye T, MD, 0.5 mg at 10/11/22 0008   metoprolol tartrate (LOPRESSOR) tablet 12.5 mg, 12.5 mg, Oral, BID, Vann, Jessica U, DO, 12.5 mg at 10/11/22 0920   mometasone-formoterol (DULERA) 200-5 MCG/ACT inhaler 2 puff, 2 puff, Inhalation, BID, Alanda Slim, Taye T, MD, 2 puff at 10/11/22 0921   mupirocin cream (BACTROBAN) 2 %, , Topical, Daily, Marguerita Merles [place], DO, Given at 10/09/22 1253   OLANZapine (ZYPREXA) injection 10 mg, 10 mg, Intramuscular, Daily PRN, Kizzie Ide B, MD, 10 mg at 10/07/22 0141   OLANZapine (ZYPREXA) tablet 15 mg, 15 mg, Oral, Daily, Cinderella, Margaret A, 15 mg at 10/10/22 2055   OLANZapine zydis (ZYPREXA) disintegrating tablet 5 mg, 5 mg, Oral, BID AC, Mariel Craft, MD, 5 mg at 10/11/22 1156   ondansetron (ZOFRAN-ODT) disintegrating tablet 4 mg, 4 mg, Oral, Q8H PRN, Sheikh, Omair Latif, DO, 4 mg at 10/09/22 4098   Oral care mouth rinse, 15 mL, Mouth Rinse, PRN, Osvaldo Shipper, MD   pantoprazole (PROTONIX) EC tablet 40 mg, 40 mg, Oral, QAC breakfast, Gonfa, Taye T, MD, 40 mg at 10/11/22 0921   polyethylene glycol (MIRALAX / GLYCOLAX) packet 17 g, 17 g, Oral, BID PRN, Alanda Slim, Taye T, MD, 17 g at 10/11/22 0920   prazosin (MINIPRESS) capsule 1 mg, 1 mg, Oral, QHS, Gonfa, Taye T, MD, 1 mg at 10/10/22 2055   senna-docusate (Senokot-S) tablet 1 tablet, 1 tablet, Oral, BID PRN, Candelaria Stagers T, MD, 1 tablet at 10/11/22 0921   sodium bicarbonate tablet 650 mg, 650 mg, Oral, Daily, Regalado, Belkys A, MD, 650 mg at 10/11/22 0920  Allergies: Allergies  Allergen Reactions   Bee Venom Anaphylaxis   Coconut Flavor Anaphylaxis and Rash   Fish Allergy Anaphylaxis   Geodon [Ziprasidone Hcl] Other (See Comments)    Pt states that this medication causes paralysis of the mouth.     Haloperidol And Related Other (See Comments)    Pt states that this medication causes  paralysis of the mouth, jaw locks up   Lithobid [Lithium] Other (See Comments)    Seizure-like activity    Roxicodone [Oxycodone] Other (See Comments)    Hallucinations    Seroquel [Quetiapine] Other (See Comments)    Severe drowsiness - Pt currently taking 12.5 mg BID, she is ok with this dose   Shellfish Allergy Anaphylaxis   Phenergan [Promethazine Hcl] Other (See Comments)    Chest pain     Prilosec [Omeprazole] Nausea And Vomiting and Other (See Comments)    Pt can take protonix with no problems    Sulfa Antibiotics Other (See Comments)    Chest pain    Tegretol [Carbamazepine]  Nausea And Vomiting   Prozac [Fluoxetine] Other (See Comments)    Increased Depression and Suicidal thoughts   Tape Other (See Comments)    Skin tears, can only tolerate paper tape.   Tylenol [Acetaminophen] Rash and Other (See Comments)    Rash on face

## 2022-10-12 DIAGNOSIS — T1491XA Suicide attempt, initial encounter: Secondary | ICD-10-CM | POA: Diagnosis not present

## 2022-10-12 DIAGNOSIS — F411 Generalized anxiety disorder: Secondary | ICD-10-CM | POA: Diagnosis not present

## 2022-10-12 DIAGNOSIS — F315 Bipolar disorder, current episode depressed, severe, with psychotic features: Secondary | ICD-10-CM | POA: Diagnosis not present

## 2022-10-12 DIAGNOSIS — T50902A Poisoning by unspecified drugs, medicaments and biological substances, intentional self-harm, initial encounter: Secondary | ICD-10-CM | POA: Diagnosis not present

## 2022-10-12 NOTE — Plan of Care (Signed)
  Problem: Clinical Measurements: Goal: Ability to maintain clinical measurements within normal limits will improve Outcome: Progressing Goal: Will remain free from infection Outcome: Progressing Goal: Diagnostic test results will improve Outcome: Progressing Goal: Cardiovascular complication will be avoided Outcome: Progressing   

## 2022-10-12 NOTE — Consult Note (Addendum)
[name] Consult Evaluation  Service Date: October 12, 2022 LOS:  LOS: 51 days    Primary Psychiatric Diagnoses   Schizoaffective disorder, bipolar type  GAD  Borderline personality disorder  Assessment  [name] is a 32 y.o. female with a past psychiatric history of borderline personality disorder, schizoaffective disorder bipolar type, and PTSD.  She has had numerous behavioral health admissions and an extensive history of suicide attempts.  Patient presents to [facility] ED for an attempted overdose on her prescription medications.  Briefly, this pt has had ~50 ED visits or psych hospitalizations during 2024; this continues a pattern of an inability to maintain safety in the outpatient setting going back multiple years. The vast majority of her suicide attempts have been via overdose on psychotropic medication. She has failed multiple levels of outpatient management up to and including CST; she has been declined by multiple psychiatric facilities due to futility, and she has been discharged by her CST team as of 8/8. She is currently on the [facility] priority wait list. She is not eligible for ACT services through [facility] due to BPD diagnosis.   We are trying to simplify patient's medication regimen to avoid polypharmacy especially in the setting of her recent overdose. Earlier in hospitalization during a period of improvement (roughly 8/1 to 8/8), we discussed medication management if and when she does return home including closer monitoring, less access to her medications through a locked cabinet, and her ex-boyfriend being more in charge of her medications in which was confirmed with her ex-boyfriend before being dropped by her CST.  After notification of discontinuation from CST services and ineligibility for ACT services, patient had acute worsening of reported hallucinations, suicidality, homicidality and agitation (fear of abandonment is central feature in BPD) and  consideration of community f/u was terminated. Patient has an extensive history of ER visits and hospitalizations in which she has displayed aggression, psychosis, and danger to self even while in proximity of a Recruitment consultant. Patient also shows high level of lethality, intensity, and frequency of suicidal attempts with multiple inpatient psychiatric admissions with little benefit so we continue to recommend [facility]; given wait list will also refer to other psychiatric hospitals. Patient is currently on the [facility] priority wait list and we are working closely with LCSW with bed availability; exploring other state hospital options.   The patient's behaviors, including chronic suicidal ideations, multiple gestures and failed attempts, impulsivity, and intense, unstable interpersonal relationships, are concerning for borderline personality disorder. Additionally, there are indications of attention-seeking and possibly malingering behaviors, as evidenced by exaggerated symptom presentation and disengagement when discussing discharge plans.  09/06: Today, the patient was awake and alert, but lying in bed.  She states that she walked twice yesterday and has had no behavioral outbursts or self-harm.  She specifically denied any suicidal or homicidal ideation, plan, or intent.  She states that she has been free from auditory and visual hallucinations for greater than 24 hours.  She is optimistic to start more aggressive discharge planning on Tuesday of next week.  She feels confident that she will continue to be able to manage her behaviors until that time.  She has not been pushed to a point of frustration since hospitalized to this week, so uncertain how she will respond once discharged.  I did encourage her to increase her ambulation to 3 times a day starting today and over the weekend.  She did appear to be disapproving, but then said she would try.  I have encouraged her to speak with her friend [name] in order to schedule  a discharge planning meeting to ensure that she has a safe discharge plan to prevent future suicide attempts.  Patient is uncertain when [name] would be available to come for a meeting. Medication administration record is reviewed, and patient has been needing less frequent dosing of medication for anxiety and agitation.   [name], a friend of the patient who was present at the bedside on 10/10/2022 has been identified and consent was obtained to include him in our discussions regarding her care and discharge planning.  Given her new willingness to improve and engage in her treatment plan, she had been informed that if she can maintain stability by avoiding non-suicidal self-injury (NSSIB), disruptive behaviors, and disrespectful interactions with staff, and effectively manage her emotions, we will consider discharging her home next week. The patient has agreed to these conditions. The patient's commitment to adhering to the outlined terms will be closely monitored, and continued support and interventions will be provided to assist her in meeting these goals for a successful transition home.  Diagnoses:  Active Hospital problems: Principal Problem:   Suicide attempt [facility]) Active Problems:   Asthma   GAD (generalized anxiety disorder)   Bipolar I disorder, current or most recent episode depressed, with psychotic features (HCC)   Overdose   Drug overdose    Plan   ## Psychiatric Medication Recommendations:  -- Continue Zyprexa to 5 mg po before lunch and supper for mood stabilization during the day (last changed 8/23) -- Continue Zyprexa 15 mg po nightly for psychosis  -- Continue Cymbalta 60 mg daily -- Continue Gabapentin 600 mg TID for agitation, mood/anxiety -- Continue Prazosin 1 mg at bedtime for PTSD  Agitation Protocol -- PRN Zyprexa 10 mg IM for agitation -- PRN Ativan 0.5 mg oral BID PRN for agitation -- PRN Atarax 25 mg TID for anxiety, agitation  ## Medical Decision Making  Capacity:   Capacity was not formally addressed during this encounter  ## Further Work-up:  -- Per primary  -- most recent EKG on 10/04/2022 had QtC of 470 EKG reading: Sinus tachycardia Inferior infarct (cited on or before 04-Oct-2022) Anterolateral infarct , age undetermined Abnormal ECG When compared with ECG of 26-Sep-2022 11:59, Anterior infarct is now Present Anterolateral infarct is now Present Nonspecific T wave abnormality no longer evident in Anterior leads attending medical physician aware of the recent EKG.  ## Disposition:  -- We recommend inpatient psychiatric hospitalization after medical hospitalization. Patient has been involuntarily committed on 09/04/2022.  -- IVC renewed on 09/04, next renewal 09/11 -- Patient remains on [facility] priority wait list   ## Behavioral / Environmental:  --  To minimize splitting of staff, assign one staff person to communicate all information from the team when feasible. -- Apply the established behavioral rules and protocols consistently throughout hospitalization (see nursing care order for details). Ensure that all staff members are aware of and adhere to the same guidelines to avoid confusion and mixed messages.  ## Safety and Observation Level:  - Based on my clinical evaluation, I estimate the patient to be at elevated risk of self harm in the current setting - At this time, we recommend continuing a 1:1 level of observation. This decision is based on my review of the chart including patient's history and current presentation, interview of the patient, mental status examination, and consideration of suicide risk including evaluating suicidal ideation, plan, intent, suicidal or self-harm behaviors, risk  factors, and protective factors. This judgment is based on our ability to directly address suicide risk, implement suicide prevention strategies and develop a safety plan while the patient is in the clinical setting. Please contact our team if  there is a concern that risk level has changed.  Suicide risk assessment  Patient has following modifiable risk factors for suicide: active suicidal ideation, under treated depression , recklessness, and lack of access to outpatient mental health resources, which we are addressing by continuing adequate safety precautions while she is hospitalized and adjusting medications   Patient has following non-modifiable or demographic risk factors for suicide: early widowhood, history of suicide attempt, history of self harm behavior, and psychiatric hospitalization  Patient has the following protective factors against suicide: Supportive friends  Thank you for this consult request. Recommendations have been communicated to the primary team.  We will follow at this time.     Mariel Craft, MD   Total Time Spent in Direct Patient Care:  I personally spent 35 minutes on the unit in direct patient care. The direct patient care time included face-to-face time with the patient, reviewing the patient's chart, communicating with other professionals, and coordinating care. Greater than 50% of this time was spent in counseling or coordinating care with the patient regarding goals of hospitalization, psycho-education, and discharge planning needs.  Psychiatric and Social History   Relevant Aspects of Hospital Course:  Admitted on 08/22/2022 for suicide attempt by overdose on her prescription medication.   Patient Report:  No overnight events noted. Per nursing note, no restraints, calm and cooperative throughout the night.   PRN meds given 09/03: atarax 1x, ativan 1x, Olanzapine  Patient seen and assessed by psychiatric provider. Patient is currently denying auditory and visual hallucinations. She denies suicidal ideations, homicidal ideations and hallucinations. She tells me she is working towards plan to discharge to home.  Collateral 9/4: [name], a friend of the patient who was present at the bedside  today, has been identified and consent was obtained to include him in our discussions regarding her care and discharge planning. 8/29: Spoke to [name] and he stated that he visited [facility] earlier this week in which she asked him to throw away her books because she was going to [facility].  8/14: called [name], no response 8/12: tried to call [name] with no response.  8/9: called Debra with CST - stated she would be happy to conduct a termination session with Leetha if she reached out . She has not been called by [name] during this hospitalization and Tayna has refused offered termination session.  8/9: called Dr. Pershing Proud with [facility] to discuss pt's recent decompensation  8/5: spoke with [name]  [employer] in to note above   Past Psychiatric History: Previous Psych Diagnoses: Schizoaffective disorder bipolar type, Anxiety, Autism spectrum disorder, PTSD, Borderline personality disorder Prior inpatient treatment: multiple, 15 ED visits and 4 inpatient admissions since 06/2022 (~50 total in 2024), admitted to [facility] from 07/29/22-08/02/2022, has been to [facility] for eight months in 2016 which led to period of stability, and was discharged from inpatient facility within a week of her OD Current/prior outpatient treatment: [facility], CST team twice a week Prior rehab hx: denies multiple Psychotherapy hx: [facility] History of suicide: 20-30 times by overdosing on pills, cutting or drinking 409 disinfectant History of homicide or aggression: denies Psychiatric medication history:  Previously trials of Tegretol, Haldol, Clozaril, Depakote, Invega, Lamictal, Seroquel, Asenapine, Abilify, Vraylar (worsened suicidal thoughts), Lexapro, Zoloft  Neuromodulation history: denies  Social History:  Marital status: Widowed Children: 80 year old daughter Lives: with ex-boyfriend [name], in two story home Employment: Disability, wants to have a career in coding and billing Tobacco: quit smoking; smoked  for two years.  Access to weapons: None   Exam Findings   Psychiatric Specialty Exam:  Presentation  General Appearance: Appropriate for Environment; Casual  Eye Contact:Good  Speech:Clear and Coherent; Normal Rate  Speech Volume:Normal  Handedness:Right   Mood and Affect  Mood:--  "good"  Affect:-- Congruent     Thought Process  Thought Processes:Coherent; Linear  Descriptions of Associations:Intact  Orientation:Full (Time, Place and Person)  Thought Content:Logical  History of Schizophrenia/Schizoaffective disorder: Yes  Duration of Psychotic Symptoms:Greater than six months  Hallucinations: Denies  Ideas of Reference:None  Suicidal Thoughts: Denies  Homicidal Thoughts: Denies   Sensorium  Memory:Immediate Good  Judgment:Fair  Insight: Shallow   Executive Functions  Concentration:Good  Attention Span:Fair  Recall:Good  Fund of Knowledge:Good  Language:Good   Psychomotor Activity  Psychomotor Activity: Normal  Assets  Assets:Desire for Improvement; Financial Resources/Insurance; Housing; Social Support; Resilience; Physical Health; Communication Skills   Sleep  Sleep: Fair    Physical Exam: Vital signs:  Temp:  [98.1 F (36.7 C)-98.2 F (36.8 C)] 98.2 F (36.8 C) (09/06 0750) Pulse Rate:  [100-115] 105 (09/06 0810) Resp:  [18-19] 18 (09/06 0750) BP: (126-128)/(65-81) 128/81 (09/06 0750) SpO2:  [98 %-99 %] 99 % (09/06 0750) Physical Exam Vitals and nursing note reviewed. Exam conducted with a chaperone present.  Constitutional:      General: She is not in acute distress.    Appearance: She is obese. She is not ill-appearing.  HENT:     Head: Normocephalic.  Cardiovascular:     Rate and Rhythm: Normal rate.  Pulmonary:     Effort: Pulmonary effort is normal. No respiratory distress.  Neurological:     General: No focal deficit present.     Mental Status: She is alert.  Psychiatric:        Attention and Perception:  Attention normal.        Speech: Speech normal.     Blood pressure 128/81, pulse (!) 105, temperature 98.2 F (36.8 C), temperature source Oral, resp. rate 18, height 5\' 5"  (1.651 m), weight 98.2 kg, last menstrual period 08/08/2022, SpO2 99%. Body mass index is 36.03 kg/m.   Other History   These have been pulled in through the EMR, reviewed, and updated if appropriate.   Family History:  The patient's family history includes Asthma in her father; Mental illness in her father; PDD in her brother; Seizures in her brother.  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    Chronic constipation    Depression    Drug-seeking behavior    Essential tremor    Headache    Ineffective individual coping 05/16/2022   Insomnia 01/12/2022   Intentional drug overdose (HCC) 06/05/2022   Neuromuscular disorder (HCC)    Normocytic anemia 06/05/2022   Overdose 07/22/2017   Overdose of acetaminophen 07/2017   and other meds   Overdose, intentional self-harm, initial encounter (  HCC) 07/20/2021   Paranoia (HCC) 04/22/2021   Personality disorder (HCC)    Purposeful non-suicidal drug ingestion (HCC) 06/27/2021   Schizo-affective psychosis (HCC)    Schizoaffective disorder (HCC) 07/29/2022   Schizoaffective disorder, bipolar type (HCC)    Seizures (HCC)    Last seizure December 2017   Skin erythema 04/27/2022   Sleep apnea    Suicidal behavior 07/25/2021   Suicidal ideation    Suicide (HCC) 07/01/2021   Suicide attempt (HCC) 07/04/2021    Surgical History: Past Surgical History:  Procedure Laterality Date   MOUTH SURGERY  2009 or 2010    Medications:    Current Facility-Administered Medications:    0.9 %  sodium chloride infusion, 250 mL, Intravenous, Continuous, Gonfa, Taye T, MD, Stopped at 08/24/22 1347   albuterol (PROVENTIL) (2.5 MG/3ML) 0.083% nebulizer solution 3 mL, 3 mL, Inhalation, Q6H PRN, Gonfa, Taye T, MD   cholecalciferol (VITAMIN D3) 10 MCG (400 UNIT) tablet 400 Units, 400 Units, Oral, Daily, Cinderella, Margaret A, 400 Units at 10/12/22 1610   cyclobenzaprine (FLEXERIL) tablet 10 mg, 10 mg, Oral, QHS, Gonfa, Taye T, MD, 10 mg at 10/11/22 2140   diclofenac Sodium (VOLTAREN) 1 % topical gel 2 g, 2 g, Topical, QID, Gonfa, Taye T, MD, 2 g at 10/12/22 1507   DULoxetine (CYMBALTA) DR capsule 60 mg, 60 mg, Oral, Daily, Akintayo, Mojeed, MD, 60 mg at 10/12/22 0925   ferrous sulfate tablet 325 mg, 325 mg, Oral, BID WC, Gonfa, Taye T, MD, 325 mg at 10/12/22 0925   gabapentin (NEURONTIN) capsule 600 mg, 600 mg, Oral, TID, Alanda Slim, Taye T, MD, 600 mg at 10/12/22 0925   hydrOXYzine (ATARAX) tablet 25 mg, 25 mg, Oral, TID PRN, Alanda Slim, Taye T, MD, 25 mg at 10/10/22 2055   ibuprofen (ADVIL) tablet 600 mg, 600 mg, Oral, TID PRN, Alanda Slim, Taye T, MD, 600 mg at 10/10/22 1419   LORazepam (ATIVAN) tablet 0.5 mg, 0.5 mg, Oral, BID PRN, Alanda Slim, Taye T, MD, 0.5 mg at 10/11/22 0008   metoprolol tartrate (LOPRESSOR) tablet 12.5 mg, 12.5 mg, Oral, BID, Vann, Jessica U, DO, 12.5 mg at 10/12/22 0925   mometasone-formoterol (DULERA) 200-5 MCG/ACT inhaler 2 puff, 2 puff, Inhalation, BID, Alanda Slim, Taye T, MD, 2 puff at 10/12/22 0924   mupirocin cream (BACTROBAN) 2 %, , Topical, Daily, Marguerita Merles Latif, DO, Given at 10/09/22 1253   OLANZapine (ZYPREXA) injection 10 mg, 10 mg, Intramuscular, Daily PRN, Kizzie Ide B, MD, 10 mg at 10/07/22 0141   OLANZapine (ZYPREXA) tablet 15 mg, 15 mg, Oral, Daily, Cinderella, Margaret A, 15 mg at 10/11/22 2140   OLANZapine zydis (ZYPREXA) disintegrating tablet 5 mg, 5 mg, Oral, BID AC, Mariel Craft, MD, 5 mg at 10/12/22  1232   ondansetron (ZOFRAN-ODT) disintegrating tablet 4 mg, 4 mg, Oral, Q8H PRN, Sheikh, Omair Latif, DO, 4 mg at 10/09/22 9604   Oral care mouth rinse, 15 mL, Mouth Rinse, PRN, Osvaldo Shipper, MD   pantoprazole (PROTONIX) EC tablet 40 mg, 40 mg, Oral, QAC breakfast, Gonfa, Taye T, MD, 40 mg at 10/12/22 0924   polyethylene glycol (MIRALAX / GLYCOLAX) packet 17 g, 17 g, Oral, BID PRN, Alanda Slim, Taye T, MD, 17 g at 10/12/22 1323   prazosin (MINIPRESS) capsule 1 mg, 1 mg, Oral, QHS, Gonfa, Taye T, MD, 1 mg at 10/11/22 2140   senna-docusate (Senokot-S) tablet 1 tablet, 1 tablet, Oral, BID PRN, Candelaria Stagers T, MD, 1 tablet at 10/12/22 1323   sodium bicarbonate tablet 650  mg, 650 mg, Oral, Daily, Regalado, Belkys A, MD, 650 mg at 10/12/22 0925  Allergies: Allergies  Allergen Reactions   Bee Venom Anaphylaxis   Coconut Flavor Anaphylaxis and Rash   Fish Allergy Anaphylaxis   Geodon [Ziprasidone Hcl] Other (See Comments)    Pt states that this medication causes paralysis of the mouth.     Haloperidol And Related Other (See Comments)    Pt states that this medication causes paralysis of the mouth, jaw locks up   Lithobid [Lithium] Other (See Comments)    Seizure-like activity    Roxicodone [Oxycodone] Other (See Comments)    Hallucinations    Seroquel [Quetiapine] Other (See Comments)    Severe drowsiness - Pt currently taking 12.5 mg BID, she is ok with this dose   Shellfish Allergy Anaphylaxis   Phenergan [Promethazine Hcl] Other (See Comments)    Chest pain     Prilosec [Omeprazole] Nausea And Vomiting and Other (See Comments)    Pt can take protonix with no problems    Sulfa Antibiotics Other (See Comments)    Chest pain    Tegretol [Carbamazepine] Nausea And Vomiting   Prozac [Fluoxetine] Other (See Comments)    Increased Depression and Suicidal thoughts   Tape Other (See Comments)    Skin tears, can only tolerate paper tape.   Tylenol [Acetaminophen] Rash and Other (See Comments)     Rash on face

## 2022-10-12 NOTE — Plan of Care (Signed)

## 2022-10-12 NOTE — Progress Notes (Signed)
PROGRESS NOTE    [name]  ION:[id number] DOB: 1990-09-09 DOA: 08/22/2022 PCP: [name]   Brief Narrative: 32 year old with past medical history significant for adjustment disorder with mixed anxiety and depressed mood, Autism, bipolar 1 disorder, fragile X syndrome, seizure not on meds, schizoaffective disorder and multiple suicidal attempts in the last year presented to the ED after ingesting numerous medications in a suicide attempt.She reported taking #30 metoprolol 25mg  tablets, #60 Hydroxyzine 25mg , #7 Lexapro 20mg , #21 Gabapentin 600mg , #7 Flexeril 10mg , #7 protonix 40mg  and #7 Prazosin 1mg .  She was bradycardic and hypotensive on EMS arrival which improved with fluids.  Poison control was contacted as she was within 2 hours of injection and was given activated charcoal.  Patient was placed under IVC in the ED.  Poison control also recommended ICU admission for close monitoring of QTc and electrolytes possible pressors.  Patient was intubated and started on IV pressors and received glucagon.  She was a started also on ceftriaxone for pneumonia.  She was subsequently extubated 7/18 and transferred to the floor 7/19.  She has been under [facility] service since 08/25/2022.  Psychiatry continue to follow.  Did recommend inpatient psychiatric hospitalization.  Awaiting placement at [facility].     Assessment & Plan:   Principal Problem:   Suicide attempt [facility]) Active Problems:   Asthma   GAD (generalized anxiety disorder)   Bipolar I disorder, current or most recent episode depressed, with psychotic features (HCC)   Overdose   Drug overdose  1-SI attempt with intentional overdose: -She presented after having taken multiple medications including: # 30 metoprolol 25mg  tablets, # 60 Hydroxyzine 25mg , # 7 Lexapro 20mg , # 21 Gabapentin 600mg , # 7 Flexeril 10mg , # 7 protonix 40mg  and # 7 Prazosin 1mg .  -Poison control was contacted.  Patient received charcoal in the ED.  She  initially required pressors, glucagon, intubation mechanical ventilation. -Currently IVC. -Patient calm, no behavior episodes have been reporter.  Psych considering discharging home next week if she remain without suicidal thought, intention, harm to herself behavior.    Anxiety and Depression, Schizoaffective Disorder, Adjustment Disorder, bipolar disorder, fibromyalgia and auditory hallucination:  Continue medication per psych -she has been calm   Community-acquired pneumonia: Completed antibiotic  Essential hypertension: Continue metoprolol and prazosin  Sinus tachycardia: Specially during episodes of anxiety. D-dimer, troponin, TSH normal limits  Metabolic  acidosis: Resolved Started weaning sodium bicarb,.   Hypomagnesemia: Replaced Deficiency anemia: Continue with iron supplement History of seizure disorder: Not on medications   Asthma.  Nebulizer PRN Hypoalbuminemia encourage oral intake Obesity:  body mass index 36   Estimated body mass index is 36.03 kg/m as calculated from the following:   Height as of this encounter: 5\' 5"  (1.651 m).   Weight as of this encounter: 98.2 kg.   DVT prophylaxis: SCD Code Status: Full code Family Communication: no family at bedside.  Disposition Plan:  Status is: Inpatient Remains inpatient appropriate because: awaiting inpatient psych admission.     Consultants:  [facility]M Psych   Procedures:  none  Antimicrobials:    Subjective: She is more interactive today. She is answering questions. She walk twice yesterday.   Objective: Vitals:   10/11/22 2016 10/11/22 2143 10/12/22 0750 10/12/22 0810  BP:  126/65 128/81   Pulse: 100 (!) 103 (!) 115 (!) 105  Resp: 18 19 18    Temp:  98.1 F (36.7 C) 98.2 F (36.8 C)   TempSrc:  Oral Oral   SpO2: 98% 99% 99%  Weight:      Height:        Intake/Output Summary (Last 24 hours) at 10/12/2022 1415 Last data filed at 10/12/2022 1300 Gross per 24 hour  Intake 1200 ml  Output 1  ml  Net 1199 ml   Filed Weights   09/01/22 0300 09/02/22 0500 09/03/22 0402  Weight: 98.8 kg 99 kg 98.2 kg    Examination:  General exam: Alert Respiratory system: CTA Cardiovascular system: S 1, S 2 RRR   Data Reviewed: I have personally reviewed following labs and imaging studies  CBC: Recent Labs  Lab 10/08/22 1304 10/11/22 2035  WBC 9.7 10.0  NEUTROABS 6.8  --   HGB 12.3 11.7*  HCT 39.5 37.8  MCV 82.0 81.5  PLT 312 286   Basic Metabolic Panel: Recent Labs  Lab 10/08/22 1304 10/11/22 2035  NA 138 135  K 3.7 3.9  CL 102 103  CO2 25 23  GLUCOSE 109* 147*  BUN 9 12  CREATININE 0.75 0.76  CALCIUM 8.8* 9.0  MG 1.8 1.7   GFR: Estimated Creatinine Clearance: 116.1 mL/min (by C-G formula based on SCr of 0.76 mg/dL). Liver Function Tests: Recent Labs  Lab 10/08/22 1304  AST 18  ALT 19  ALKPHOS 75  BILITOT 0.6  PROT 6.8  ALBUMIN 3.4*   No results for input(s): "LIPASE", "AMYLASE" in the last 168 hours. No results for input(s): "AMMONIA" in the last 168 hours. Coagulation Profile: No results for input(s): "INR", "PROTIME" in the last 168 hours. Cardiac Enzymes: No results for input(s): "CKTOTAL", "CKMB", "CKMBINDEX", "TROPONINI" in the last 168 hours. BNP (last 3 results) No results for input(s): "PROBNP" in the last 8760 hours. HbA1C: No results for input(s): "HGBA1C" in the last 72 hours. CBG: No results for input(s): "GLUCAP" in the last 168 hours. Lipid Profile: No results for input(s): "CHOL", "HDL", "LDLCALC", "TRIG", "CHOLHDL", "LDLDIRECT" in the last 72 hours. Thyroid Function Tests: No results for input(s): "TSH", "T4TOTAL", "FREET4", "T3FREE", "THYROIDAB" in the last 72 hours. Anemia Panel: No results for input(s): "VITAMINB12", "FOLATE", "FERRITIN", "TIBC", "IRON", "RETICCTPCT" in the last 72 hours. Sepsis Labs: No results for input(s): "PROCALCITON", "LATICACIDVEN" in the last 168 hours.  No results found for this or any previous visit  (from the past 240 hour(s)).       Radiology Studies: No results found.      Scheduled Meds:  cholecalciferol  400 Units Oral Daily   cyclobenzaprine  10 mg Oral QHS   diclofenac Sodium  2 g Topical QID   DULoxetine  60 mg Oral Daily   ferrous sulfate  325 mg Oral BID WC   gabapentin  600 mg Oral TID   metoprolol tartrate  12.5 mg Oral BID   mometasone-formoterol  2 puff Inhalation BID   mupirocin cream   Topical Daily   OLANZapine  15 mg Oral Daily   OLANZapine zydis  5 mg Oral BID AC   pantoprazole  40 mg Oral QAC breakfast   prazosin  1 mg Oral QHS   sodium bicarbonate  650 mg Oral Daily   Continuous Infusions:  sodium chloride Stopped (08/24/22 1347)     LOS: 51 days    Time spent: 35 minutes    Tahiry Spicer A Mazelle Huebert, MD [facility]   If 7PM-7AM, please contact night-coverage [email or web address]  10/12/2022, 2:15 PM

## 2022-10-13 DIAGNOSIS — T1491XA Suicide attempt, initial encounter: Secondary | ICD-10-CM | POA: Diagnosis not present

## 2022-10-13 MED ORDER — MAGNESIUM OXIDE -MG SUPPLEMENT 400 (240 MG) MG PO TABS
400.0000 mg | ORAL_TABLET | Freq: Two times a day (BID) | ORAL | Status: DC
  Administered 2022-10-13 – 2022-10-19 (×13): 400 mg via ORAL
  Filled 2022-10-13 (×13): qty 1

## 2022-10-13 NOTE — Plan of Care (Signed)
  Problem: Activity: Goal: Risk for activity intolerance will decrease Outcome: Progressing   Problem: Coping: Goal: Level of anxiety will decrease Outcome: Progressing   Problem: Safety: Goal: Ability to remain free from injury will improve Outcome: Progressing   
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
## 2022-10-13 NOTE — Progress Notes (Signed)
PROGRESS NOTE    [name]  ZOX:[id number] DOB: 1990/08/25 DOA: 08/22/2022 PCP: [name]   Brief Narrative: 32 year old with past medical history significant for adjustment disorder with mixed anxiety and depressed mood, Autism, bipolar 1 disorder, fragile X syndrome, seizure not on meds, schizoaffective disorder and multiple suicidal attempts in the last year presented to the ED after ingesting numerous medications in a suicide attempt.She reported taking #30 metoprolol 25mg  tablets, #60 Hydroxyzine 25mg , #7 Lexapro 20mg , #21 Gabapentin 600mg , #7 Flexeril 10mg , #7 protonix 40mg  and #7 Prazosin 1mg .  She was bradycardic and hypotensive on EMS arrival which improved with fluids.  Poison control was contacted as she was within 2 hours of injection and was given activated charcoal.  Patient was placed under IVC in the ED.  Poison control also recommended ICU admission for close monitoring of QTc and electrolytes possible pressors.  Patient was intubated and started on IV pressors and received glucagon.  She was a started also on ceftriaxone for pneumonia.  She was subsequently extubated 7/18 and transferred to the floor 7/19.  She has been under [facility] service since 08/25/2022.  Psychiatry continue to follow.  Did recommend inpatient psychiatric hospitalization.  Awaiting placement at [facility].     Assessment & Plan:   Principal Problem:   Suicide attempt [facility]) Active Problems:   Asthma   GAD (generalized anxiety disorder)   Bipolar I disorder, current or most recent episode depressed, with psychotic features (HCC)   Overdose   Drug overdose  1-SI attempt with intentional overdose: -She presented after having taken multiple medications including: # 30 metoprolol 25mg  tablets, # 60 Hydroxyzine 25mg , # 7 Lexapro 20mg , # 21 Gabapentin 600mg , # 7 Flexeril 10mg , # 7 protonix 40mg  and # 7 Prazosin 1mg .  -Poison control was contacted.  Patient received charcoal in the ED.  She  initially required pressors, glucagon, intubation mechanical ventilation. -Currently IVC. -Patient calm, no behavior episodes have been reporter.  Psych considering discharging home next week if she remain without suicidal thought, intention, harm to herself behavior.    Anxiety and Depression, Schizoaffective Disorder, Adjustment Disorder, bipolar disorder, fibromyalgia and auditory hallucination:  Continue medication per psych -she has been calm , no behavior issues informed.   Community-acquired pneumonia: Completed antibiotic  Essential hypertension: Continue metoprolol and prazosin  Sinus tachycardia: Specially during episodes of anxiety. D-dimer, troponin, TSH normal limits  Metabolic  acidosis: Resolved Started weaning sodium bicarb,.   Hypomagnesemia: Replaced Deficiency anemia: Continue with iron supplement History of seizure disorder: Not on medications   Asthma.  Nebulizer PRN Hypoalbuminemia encourage oral intake Obesity:  body mass index 36   Estimated body mass index is 36.03 kg/m as calculated from the following:   Height as of this encounter: 5\' 5"  (1.651 m).   Weight as of this encounter: 98.2 kg.   DVT prophylaxis: SCD Code Status: Full code Family Communication: no family at bedside.  Disposition Plan:  Status is: Inpatient Remains inpatient appropriate because: awaiting inpatient psych admission.     Consultants:  [facility] Psych   Procedures:  none  Antimicrobials:    Subjective: She is alert, answer questions, per sitter no behavior issues.   Objective: Vitals:   10/12/22 2028 10/12/22 2047 10/13/22 0758 10/13/22 0802  BP: 124/75   128/73  Pulse: (!) 105   (!) 105  Resp: 16   18  Temp: 97.8 F (36.6 C)   97.6 F (36.4 C)  TempSrc:    Oral  SpO2: 97% 94%  94% 98%  Weight:      Height:        Intake/Output Summary (Last 24 hours) at 10/13/2022 1353 Last data filed at 10/13/2022 1130 Gross per 24 hour  Intake 1680 ml  Output --   Net 1680 ml   Filed Weights   09/01/22 0300 09/02/22 0500 09/03/22 0402  Weight: 98.8 kg 99 kg 98.2 kg    Examination:  General exam: Alert Respiratory system: CTA Cardiovascular system: S 1, S 2 RRR   Data Reviewed: I have personally reviewed following labs and imaging studies  CBC: Recent Labs  Lab 10/08/22 1304 10/11/22 2035  WBC 9.7 10.0  NEUTROABS 6.8  --   HGB 12.3 11.7*  HCT 39.5 37.8  MCV 82.0 81.5  PLT 312 286   Basic Metabolic Panel: Recent Labs  Lab 10/08/22 1304 10/11/22 2035  NA 138 135  K 3.7 3.9  CL 102 103  CO2 25 23  GLUCOSE 109* 147*  BUN 9 12  CREATININE 0.75 0.76  CALCIUM 8.8* 9.0  MG 1.8 1.7   GFR: Estimated Creatinine Clearance: 116.1 mL/min (by C-G formula based on SCr of 0.76 mg/dL). Liver Function Tests: Recent Labs  Lab 10/08/22 1304  AST 18  ALT 19  ALKPHOS 75  BILITOT 0.6  PROT 6.8  ALBUMIN 3.4*   No results for input(s): "LIPASE", "AMYLASE" in the last 168 hours. No results for input(s): "AMMONIA" in the last 168 hours. Coagulation Profile: No results for input(s): "INR", "PROTIME" in the last 168 hours. Cardiac Enzymes: No results for input(s): "CKTOTAL", "CKMB", "CKMBINDEX", "TROPONINI" in the last 168 hours. BNP (last 3 results) No results for input(s): "PROBNP" in the last 8760 hours. HbA1C: No results for input(s): "HGBA1C" in the last 72 hours. CBG: No results for input(s): "GLUCAP" in the last 168 hours. Lipid Profile: No results for input(s): "CHOL", "HDL", "LDLCALC", "TRIG", "CHOLHDL", "LDLDIRECT" in the last 72 hours. Thyroid Function Tests: No results for input(s): "TSH", "T4TOTAL", "FREET4", "T3FREE", "THYROIDAB" in the last 72 hours. Anemia Panel: No results for input(s): "VITAMINB12", "FOLATE", "FERRITIN", "TIBC", "IRON", "RETICCTPCT" in the last 72 hours. Sepsis Labs: No results for input(s): "PROCALCITON", "LATICACIDVEN" in the last 168 hours.  No results found for this or any previous visit  (from the past 240 hour(s)).       Radiology Studies: No results found.      Scheduled Meds:  cholecalciferol  400 Units Oral Daily   cyclobenzaprine  10 mg Oral QHS   diclofenac Sodium  2 g Topical QID   DULoxetine  60 mg Oral Daily   ferrous sulfate  325 mg Oral BID WC   gabapentin  600 mg Oral TID   magnesium oxide  400 mg Oral BID   metoprolol tartrate  12.5 mg Oral BID   mometasone-formoterol  2 puff Inhalation BID   mupirocin cream   Topical Daily   OLANZapine  15 mg Oral Daily   OLANZapine zydis  5 mg Oral BID AC   pantoprazole  40 mg Oral QAC breakfast   prazosin  1 mg Oral QHS   sodium bicarbonate  650 mg Oral Daily   Continuous Infusions:  sodium chloride Stopped (08/24/22 1347)     LOS: 52 days    Time spent: 35 minutes    Muad Noga A Yichen Gilardi, MD [facility]   If 7PM-7AM, please contact night-coverage [email or web address]  10/13/2022, 1:53 PM

## 2022-10-14 DIAGNOSIS — T1491XA Suicide attempt, initial encounter: Secondary | ICD-10-CM | POA: Diagnosis not present

## 2022-10-14 NOTE — Progress Notes (Signed)
PROGRESS NOTE    [name]  OVF:[id number] DOB: [birth year] DOA: 08/22/2022 PCP: [name]   Brief Narrative: 32 year old with past medical history significant for adjustment disorder with mixed anxiety and depressed mood, Autism, bipolar 1 disorder, fragile X syndrome, seizure not on meds, schizoaffective disorder and multiple suicidal attempts in the last year presented to the ED after ingesting numerous medications in a suicide attempt.She reported taking #30 metoprolol 25mg  tablets, #60 Hydroxyzine 25mg , #7 Lexapro 20mg , #21 Gabapentin 600mg , #7 Flexeril 10mg , #7 protonix 40mg  and #7 Prazosin 1mg .  She was bradycardic and hypotensive on EMS arrival which improved with fluids.  Poison control was contacted as she was within 2 hours of injection and was given activated charcoal.  Patient was placed under IVC in the ED.  Poison control also recommended ICU admission for close monitoring of QTc and electrolytes possible pressors.  Patient was intubated and started on IV pressors and received glucagon.  She was a started also on ceftriaxone for pneumonia.  She was subsequently extubated 7/18 and transferred to the floor 7/19.  She has been under [facility] service since 08/25/2022.  Psychiatry continue to follow.  Did recommend inpatient psychiatric hospitalization.  Awaiting placement at [facility].     Assessment & Plan:   Principal Problem:   Suicide attempt [facility]) Active Problems:   Asthma   GAD (generalized anxiety disorder)   Bipolar I disorder, current or most recent episode depressed, with psychotic features (HCC)   Overdose   Drug overdose  1-SI attempt with intentional overdose: -She presented after having taken multiple medications including: # 30 metoprolol 25mg  tablets, # 60 Hydroxyzine 25mg , # 7 Lexapro 20mg , # 21 Gabapentin 600mg , # 7 Flexeril 10mg , # 7 protonix 40mg  and # 7 Prazosin 1mg .  -Poison control was contacted.  Patient received charcoal in the ED.  She  initially required pressors, glucagon, intubation mechanical ventilation. -Currently IVC. Patient  reporter suicidal ideation 9/7, was agitate.   Anxiety and Depression, Schizoaffective Disorder, Adjustment Disorder, bipolar disorder, fibromyalgia and auditory hallucination:  Continue medication per psych   Community-acquired pneumonia: Completed antibiotic  Essential hypertension: Continue metoprolol and prazosin  Sinus tachycardia: Specially during episodes of anxiety. D-dimer, troponin, TSH normal limits  Metabolic  acidosis: Resolved Started weaning sodium bicarb,.   Hypomagnesemia: Replaced Deficiency anemia: Continue with iron supplement History of seizure disorder: Not on medications   Asthma.  Nebulizer PRN Hypoalbuminemia encourage oral intake Obesity:  body mass index 36   Estimated body mass index is 36.03 kg/m as calculated from the following:   Height as of this encounter: 5\' 5"  (1.651 m).   Weight as of this encounter: 98.2 kg.   DVT prophylaxis: SCD Code Status: Full code Family Communication: no family at bedside.  Disposition Plan:  Status is: Inpatient Remains inpatient appropriate because: awaiting inpatient psych admission.     Consultants:  CCM Psych   Procedures:  none  Antimicrobials:    Subjective: Sleepy this am.   Objective: Vitals:   10/13/22 2038 10/14/22 0739 10/14/22 0850 10/14/22 1039  BP: 132/81  131/78 133/75  Pulse:  96 (!) 122 (!) 109  Resp: 18 18 17 16   Temp: 98.3 F (36.8 C)  98.4 F (36.9 C) 98.2 F (36.8 C)  TempSrc: Oral  Oral   SpO2:  98% 99% 97%  Weight:      Height:        Intake/Output Summary (Last 24 hours) at 10/14/2022 1417 Last data filed at 10/14/2022 1300 Gross  per 24 hour  Intake 960 ml  Output --  Net 960 ml   Filed Weights   09/01/22 0300 09/02/22 0500 09/03/22 0402  Weight: 98.8 kg 99 kg 98.2 kg    Examination:  General exam: Alert Respiratory system: CTA Cardiovascular system: S  1, S 2 RRR   Data Reviewed: I have personally reviewed following labs and imaging studies  CBC: Recent Labs  Lab 10/08/22 1304 10/11/22 2035  WBC 9.7 10.0  NEUTROABS 6.8  --   HGB 12.3 11.7*  HCT 39.5 37.8  MCV 82.0 81.5  PLT 312 286   Basic Metabolic Panel: Recent Labs  Lab 10/08/22 1304 10/11/22 2035  NA 138 135  K 3.7 3.9  CL 102 103  CO2 25 23  GLUCOSE 109* 147*  BUN 9 12  CREATININE 0.75 0.76  CALCIUM 8.8* 9.0  MG 1.8 1.7   GFR: Estimated Creatinine Clearance: 116.1 mL/min (by C-G formula based on SCr of 0.76 mg/dL). Liver Function Tests: Recent Labs  Lab 10/08/22 1304  AST 18  ALT 19  ALKPHOS 75  BILITOT 0.6  PROT 6.8  ALBUMIN 3.4*   No results for input(s): "LIPASE", "AMYLASE" in the last 168 hours. No results for input(s): "AMMONIA" in the last 168 hours. Coagulation Profile: No results for input(s): "INR", "PROTIME" in the last 168 hours. Cardiac Enzymes: No results for input(s): "CKTOTAL", "CKMB", "CKMBINDEX", "TROPONINI" in the last 168 hours. BNP (last 3 results) No results for input(s): "PROBNP" in the last 8760 hours. HbA1C: No results for input(s): "HGBA1C" in the last 72 hours. CBG: No results for input(s): "GLUCAP" in the last 168 hours. Lipid Profile: No results for input(s): "CHOL", "HDL", "LDLCALC", "TRIG", "CHOLHDL", "LDLDIRECT" in the last 72 hours. Thyroid Function Tests: No results for input(s): "TSH", "T4TOTAL", "FREET4", "T3FREE", "THYROIDAB" in the last 72 hours. Anemia Panel: No results for input(s): "VITAMINB12", "FOLATE", "FERRITIN", "TIBC", "IRON", "RETICCTPCT" in the last 72 hours. Sepsis Labs: No results for input(s): "PROCALCITON", "LATICACIDVEN" in the last 168 hours.  No results found for this or any previous visit (from the past 240 hour(s)).       Radiology Studies: No results found.      Scheduled Meds:  cholecalciferol  400 Units Oral Daily   cyclobenzaprine  10 mg Oral QHS   diclofenac Sodium  2  g Topical QID   DULoxetine  60 mg Oral Daily   ferrous sulfate  325 mg Oral BID WC   gabapentin  600 mg Oral TID   magnesium oxide  400 mg Oral BID   metoprolol tartrate  12.5 mg Oral BID   mometasone-formoterol  2 puff Inhalation BID   mupirocin cream   Topical Daily   OLANZapine  15 mg Oral Daily   OLANZapine zydis  5 mg Oral BID AC   pantoprazole  40 mg Oral QAC breakfast   prazosin  1 mg Oral QHS   sodium bicarbonate  650 mg Oral Daily   Continuous Infusions:  sodium chloride Stopped (08/24/22 1347)     LOS: 53 days    Time spent: 35 minutes    Bonham Zingale A Tayshon Winker, MD [facility]   If 7PM-7AM, please contact night-coverage [email or web address]  10/14/2022, 2:17 PM

## 2022-10-14 NOTE — Plan of Care (Signed)
  Problem: Clinical Measurements: Goal: Will remain free from infection Outcome: Progressing Goal: Cardiovascular complication will be avoided Outcome: Progressing   Problem: Activity: Goal: Risk for activity intolerance will decrease Outcome: Progressing   Problem: Elimination: Goal: Will not experience complications related to bowel motility Outcome: Progressing   Problem: Safety: Goal: Ability to remain free from injury will improve Outcome: Progressing   Problem: Skin Integrity: Goal: Risk for impaired skin integrity will decrease Outcome: Progressing   Problem: Nutritional: Goal: Maintenance of adequate nutrition will improve Outcome: Progressing   Problem: Skin Integrity: Goal: Risk for impaired skin integrity will decrease Outcome: Progressing

## 2022-10-15 DIAGNOSIS — T1491XA Suicide attempt, initial encounter: Secondary | ICD-10-CM | POA: Diagnosis not present

## 2022-10-15 MED ORDER — OLANZAPINE 10 MG PO TABS
5.0000 mg | ORAL_TABLET | Freq: Once | ORAL | Status: AC
  Administered 2022-10-15: 5 mg via ORAL
  Filled 2022-10-15: qty 1

## 2022-10-15 MED ORDER — LORAZEPAM 1 MG PO TABS
1.0000 mg | ORAL_TABLET | Freq: Once | ORAL | Status: AC
  Administered 2022-10-15: 1 mg via ORAL
  Filled 2022-10-15: qty 1
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
## 2022-10-15 NOTE — Progress Notes (Signed)
PROGRESS NOTE    [name]  VQQ:[id number] DOB: 09/23/1990 DOA: 08/22/2022 PCP: [name]   Brief Narrative: 32 year old with past medical history significant for adjustment disorder with mixed anxiety and depressed mood, Autism, bipolar 1 disorder, fragile X syndrome, seizure not on meds, schizoaffective disorder and multiple suicidal attempts in the last year presented to the ED after ingesting numerous medications in a suicide attempt.She reported taking #30 metoprolol 25mg  tablets, #60 Hydroxyzine 25mg , #7 Lexapro 20mg , #21 Gabapentin 600mg , #7 Flexeril 10mg , #7 protonix 40mg  and #7 Prazosin 1mg .  She was bradycardic and hypotensive on EMS arrival which improved with fluids.  Poison control was contacted as she was within 2 hours of injection and was given activated charcoal.  Patient was placed under IVC in the ED.  Poison control also recommended ICU admission for close monitoring of QTc and electrolytes possible pressors.  Patient was intubated and started on IV pressors and received glucagon.  She was a started also on ceftriaxone for pneumonia.  She was subsequently extubated 7/18 and transferred to the floor 7/19.  She has been under [facility] service since 08/25/2022.  Psychiatry continue to follow.  Did recommend inpatient psychiatric hospitalization.  Awaiting placement at [facility].     Assessment & Plan:   Principal Problem:   Suicide attempt [facility]) Active Problems:   Asthma   GAD (generalized anxiety disorder)   Bipolar I disorder, current or most recent episode depressed, with psychotic features (HCC)   Overdose   Drug overdose  1-SI attempt with intentional overdose: -She presented after having taken multiple medications including: # 30 metoprolol 25mg  tablets, # 60 Hydroxyzine 25mg , # 7 Lexapro 20mg , # 21 Gabapentin 600mg , # 7 Flexeril 10mg , # 7 protonix 40mg  and # 7 Prazosin 1mg .  -Poison control was contacted.  Patient received charcoal in the ED.  She  initially required pressors, glucagon, intubation mechanical ventilation. -Currently IVC. Patient  reporter suicidal ideation 9/7, was agitate.  9/09 Patient wrapped cord around her neck, hit rail of bed, got agitated.  Will likely need inpatient psych facility  Anxiety and Depression, Schizoaffective Disorder, Adjustment Disorder, bipolar disorder, fibromyalgia and auditory hallucination:  Continue medication per psych   Community-acquired pneumonia: Completed antibiotic  Essential hypertension: Continue metoprolol and prazosin  Sinus tachycardia: Specially during episodes of anxiety. D-dimer, troponin, TSH normal limits  Metabolic  acidosis: Resolved Started weaning sodium bicarb,.   Hypomagnesemia: Replaced Deficiency anemia: Continue with iron supplement History of seizure disorder: Not on medications   Asthma.  Nebulizer PRN Hypoalbuminemia encourage oral intake Obesity:  body mass index 36   Estimated body mass index is 36.03 kg/m as calculated from the following:   Height as of this encounter: 5\' 5"  (1.651 m).   Weight as of this encounter: 98.2 kg.   DVT prophylaxis: SCD Code Status: Full code Family Communication: no family at bedside.  Disposition Plan:  Status is: Inpatient Remains inpatient appropriate because: awaiting inpatient psych admission.     Consultants:  [facility] Psych   Procedures:  none  Antimicrobials:    Subjective: I saw her this am. She was alert, denies pain.    Objective: Vitals:   10/14/22 1039 10/14/22 1619 10/14/22 2053 10/15/22 0630  BP: 133/75 129/83 138/85 123/73  Pulse: (!) 109 (!) 115 (!) 105 (!) 107  Resp: 16 16 20 20   Temp: 98.2 F (36.8 C) 98.6 F (37 C) 98.2 F (36.8 C) 97.9 F (36.6 C)  TempSrc:  Oral Oral Oral  SpO2: 97%  99% 98% 99%  Weight:      Height:        Intake/Output Summary (Last 24 hours) at 10/15/2022 1330 Last data filed at 10/15/2022 0751 Gross per 24 hour  Intake 1200 ml  Output --  Net  1200 ml   Filed Weights   09/01/22 0300 09/02/22 0500 09/03/22 0402  Weight: 98.8 kg 99 kg 98.2 kg    Examination:  General exam:Alert Respiratory system: CTA Cardiovascular system: S 1, S 2 RRR   Data Reviewed: I have personally reviewed following labs and imaging studies  CBC: Recent Labs  Lab 10/11/22 2035  WBC 10.0  HGB 11.7*  HCT 37.8  MCV 81.5  PLT 286   Basic Metabolic Panel: Recent Labs  Lab 10/11/22 2035  NA 135  K 3.9  CL 103  CO2 23  GLUCOSE 147*  BUN 12  CREATININE 0.76  CALCIUM 9.0  MG 1.7   GFR: Estimated Creatinine Clearance: 116.1 mL/min (by C-G formula based on SCr of 0.76 mg/dL). Liver Function Tests: No results for input(s): "AST", "ALT", "ALKPHOS", "BILITOT", "PROT", "ALBUMIN" in the last 168 hours.  No results for input(s): "LIPASE", "AMYLASE" in the last 168 hours. No results for input(s): "AMMONIA" in the last 168 hours. Coagulation Profile: No results for input(s): "INR", "PROTIME" in the last 168 hours. Cardiac Enzymes: No results for input(s): "CKTOTAL", "CKMB", "CKMBINDEX", "TROPONINI" in the last 168 hours. BNP (last 3 results) No results for input(s): "PROBNP" in the last 8760 hours. HbA1C: No results for input(s): "HGBA1C" in the last 72 hours. CBG: No results for input(s): "GLUCAP" in the last 168 hours. Lipid Profile: No results for input(s): "CHOL", "HDL", "LDLCALC", "TRIG", "CHOLHDL", "LDLDIRECT" in the last 72 hours. Thyroid Function Tests: No results for input(s): "TSH", "T4TOTAL", "FREET4", "T3FREE", "THYROIDAB" in the last 72 hours. Anemia Panel: No results for input(s): "VITAMINB12", "FOLATE", "FERRITIN", "TIBC", "IRON", "RETICCTPCT" in the last 72 hours. Sepsis Labs: No results for input(s): "PROCALCITON", "LATICACIDVEN" in the last 168 hours.  No results found for this or any previous visit (from the past 240 hour(s)).       Radiology Studies: No results found.      Scheduled Meds:   cholecalciferol  400 Units Oral Daily   cyclobenzaprine  10 mg Oral QHS   diclofenac Sodium  2 g Topical QID   DULoxetine  60 mg Oral Daily   ferrous sulfate  325 mg Oral BID WC   gabapentin  600 mg Oral TID   magnesium oxide  400 mg Oral BID   metoprolol tartrate  12.5 mg Oral BID   mometasone-formoterol  2 puff Inhalation BID   mupirocin cream   Topical Daily   OLANZapine  15 mg Oral Daily   OLANZapine zydis  5 mg Oral BID AC   pantoprazole  40 mg Oral QAC breakfast   prazosin  1 mg Oral QHS   sodium bicarbonate  650 mg Oral Daily   Continuous Infusions:  sodium chloride Stopped (08/24/22 1347)     LOS: 54 days    Time spent: 35 minutes    Makenze Ellett A Leshia Kope, MD [facility]   If 7PM-7AM, please contact night-coverage [email or web address]  10/15/2022, 1:30 PM
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
## 2022-10-16 DIAGNOSIS — F315 Bipolar disorder, current episode depressed, severe, with psychotic features: Secondary | ICD-10-CM | POA: Diagnosis not present

## 2022-10-16 MED ORDER — LORAZEPAM 1 MG PO TABS
1.0000 mg | ORAL_TABLET | Freq: Two times a day (BID) | ORAL | Status: DC | PRN
  Administered 2022-10-16 – 2022-10-19 (×5): 1 mg via ORAL
  Filled 2022-10-16 (×5): qty 1

## 2022-10-16 MED ORDER — ARIPIPRAZOLE 2 MG PO TABS
2.0000 mg | ORAL_TABLET | Freq: Every day | ORAL | Status: DC
  Administered 2022-10-16 – 2022-10-19 (×4): 2 mg via ORAL
  Filled 2022-10-16 (×5): qty 1

## 2022-10-16 NOTE — Progress Notes (Signed)
Patient requesting to go to the bathroom. Restraint removed after warning the patient about possibly getting restrained again if she behaves in aggressive way. Patient feeling sorry for last night and is calm and resting on the bed, V/S tachycardic with 110 bpm otherwise stable. Will continue to monitor the patient.

## 2022-10-16 NOTE — TOC Progression Note (Signed)
Transition of Care [facility]) - Progression Note    Patient Details  Name: Surie Smid MRN: [id number] Date of Birth: Jul 18, 1990  [facility] CM/SW Contact  Lorri Frederick, LCSW Phone Number: 10/16/2022, 12:06 PM  Clinical Narrative:   New psych note faxed to [facility].         Expected Discharge Plan and Services                                               Social Determinants of Health (SDOH) Interventions SDOH Screenings   Food Insecurity: No Food Insecurity (07/29/2022)  Housing: Low Risk  (07/29/2022)  Transportation Needs: No Transportation Needs (07/29/2022)  Recent Concern: Transportation Needs - Unmet Transportation Needs (05/06/2022)  Utilities: Not At Risk (07/29/2022)  Alcohol Screen: Low Risk  (07/29/2022)  Depression (PHQ2-9): Medium Risk (03/10/2022)  Tobacco Use: Medium Risk (09/07/2022)    Readmission Risk Interventions    06/06/2022    3:38 PM  Readmission Risk Prevention Plan  Transportation Screening Complete  Medication Review (RN Care Manager) Complete  PCP or Specialist appointment within 3-5 days of discharge Complete  HRI or Home Care Consult Complete  SW Recovery Care/Counseling Consult Complete  Palliative Care Screening Not Applicable  Skilled Nursing Facility Not Applicable

## 2022-10-16 NOTE — Progress Notes (Signed)
Patient was not on restraints since this morning shift change. Patient had 1 outburst this am, de escalated and calmed down. Reminded that if she can have a good day, patient call light or remote can be given to her at 8:30 to watch television. Throughout the day patient did not show aggression to staff and self.

## 2022-10-16 NOTE — Consult Note (Signed)
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 causes paralysis of the mouth.     Haloperidol And Related Other (See Comments)    Pt states that this medication causes paralysis of the  mouth, jaw locks up   Lithobid [Lithium] Other (See Comments)    Seizure-like activity    Roxicodone [Oxycodone] Other (See Comments)    Hallucinations    Seroquel [Quetiapine] Other (See Comments)    Severe drowsiness - Pt currently taking 12.5 mg BID, she is ok with this dose   Shellfish Allergy Anaphylaxis   Phenergan [Promethazine Hcl] Other (See Comments)    Chest pain     Prilosec [Omeprazole] Nausea And Vomiting and Other (See Comments)    Pt can take protonix with no problems    Sulfa Antibiotics Other (See Comments)    Chest pain    Tegretol [Carbamazepine] Nausea And Vomiting   Prozac [Fluoxetine] Other (See Comments)    Increased Depression and Suicidal thoughts   Tape Other (See Comments)    Skin tears, can only tolerate paper tape.   Tylenol [Acetaminophen] Rash and Other (See Comments)    Rash on face

## 2022-10-16 NOTE — Plan of Care (Signed)
  Problem: Coping: Goal: Level of anxiety will decrease Outcome: Progressing   Problem: Elimination: Goal: Will not experience complications related to bowel motility Outcome: Progressing   Problem: Pain Managment: Goal: General experience of comfort will improve Outcome: Progressing   

## 2022-10-16 NOTE — Progress Notes (Signed)
PROGRESS NOTE    [name]  ZDG:[id number] DOB: 01-29-1991 DOA: 08/22/2022 PCP: [name]   Brief Narrative: 32 year old with past medical history significant for adjustment disorder with mixed anxiety and depressed mood, Autism, bipolar 1 disorder, fragile X syndrome, seizure not on meds, schizoaffective disorder and multiple suicidal attempts in the last year presented to the ED after ingesting numerous medications in a suicide attempt.She reported taking #30 metoprolol 25mg  tablets, #60 Hydroxyzine 25mg , #7 Lexapro 20mg , #21 Gabapentin 600mg , #7 Flexeril 10mg , #7 protonix 40mg  and #7 Prazosin 1mg .  She was bradycardic and hypotensive on EMS arrival which improved with fluids.  Poison control was contacted as she was within 2 hours of injection and was given activated charcoal.  Patient was placed under IVC in the ED.  Poison control also recommended ICU admission for close monitoring of QTc and electrolytes possible pressors.  Patient was intubated and started on IV pressors and received glucagon.  She was a started also on ceftriaxone for pneumonia.  She was subsequently extubated 7/18 and transferred to the floor 7/19.  She has been under [facility] service since 08/25/2022.  Psychiatry continue to follow.  Did recommend inpatient psychiatric hospitalization.  Awaiting placement at [facility].     Assessment & Plan:   Principal Problem:   Suicide attempt [facility]) Active Problems:   Asthma   GAD (generalized anxiety disorder)   Bipolar I disorder, current or most recent episode depressed, with psychotic features (HCC)   Overdose   Drug overdose  1-SI attempt with intentional overdose: -She presented after having taken multiple medications including: # 30 metoprolol 25mg  tablets, # 60 Hydroxyzine 25mg , # 7 Lexapro 20mg , # 21 Gabapentin 600mg , # 7 Flexeril 10mg , # 7 protonix 40mg  and # 7 Prazosin 1mg .  -Poison control was contacted.  Patient received charcoal in the ED.  She  initially required pressors, glucagon, intubation mechanical ventilation. -Currently IVC. Patient  reporter suicidal ideation 9/7, was agitate.  9/09 Patient wrapped cord around her neck, hit rail of bed, got agitated.  Patient with scalation of behavior since 9/07 Will likely need inpatient psych facility admission.  Started on Abilify today. On Zyprexa.   Anxiety and Depression, Schizoaffective Disorder, Adjustment Disorder, bipolar disorder, fibromyalgia and auditory hallucination:  Continue medication per psych   Community-acquired pneumonia: Completed antibiotic  Essential hypertension: Continue metoprolol and prazosin  Sinus tachycardia: Specially during episodes of anxiety. D-dimer, troponin, TSH normal limits  Metabolic  acidosis: Resolved Started weaning sodium bicarb,.   Hypomagnesemia: Replaced Deficiency anemia: Continue with iron supplement History of seizure disorder: Not on medications   Asthma.  Nebulizer PRN Hypoalbuminemia encourage oral intake Obesity:  body mass index 36   Estimated body mass index is 36.03 kg/m as calculated from the following:   Height as of this encounter: 5\' 5"  (1.651 m).   Weight as of this encounter: 98.2 kg.   DVT prophylaxis: SCD Code Status: Full code Family Communication: no family at bedside.  Disposition Plan:  Status is: Inpatient Remains inpatient appropriate because: awaiting inpatient psych admission.     Consultants:  [facility] Psych   Procedures:  none  Antimicrobials:    Subjective: She has had multiples behavior issues, altercation, hitting staff She is alert, no ne medical concern.   Objective: Vitals:   10/14/22 2053 10/15/22 0630 10/15/22 2030 10/16/22 0607  BP: 138/85 123/73 138/67 138/69  Pulse: (!) 105 (!) 107 (!) 128 (!) 110  Resp: 20 20 18 18   Temp: 98.2 F (36.8 C)  97.9 F (36.6 C) 98.6 F (37 C) 98 F (36.7 C)  TempSrc: Oral Oral Oral Oral  SpO2: 98% 99% 97% 99%  Weight:       Height:        Intake/Output Summary (Last 24 hours) at 10/16/2022 1411 Last data filed at 10/16/2022 1201 Gross per 24 hour  Intake 600 ml  Output --  Net 600 ml   Filed Weights   09/01/22 0300 09/02/22 0500 09/03/22 0402  Weight: 98.8 kg 99 kg 98.2 kg    Examination:  General exam: Alert Respiratory system: CTA Cardiovascular system: S 1, S 2 RRR   Data Reviewed: I have personally reviewed following labs and imaging studies  CBC: Recent Labs  Lab 10/11/22 2035  WBC 10.0  HGB 11.7*  HCT 37.8  MCV 81.5  PLT 286   Basic Metabolic Panel: Recent Labs  Lab 10/11/22 2035  NA 135  K 3.9  CL 103  CO2 23  GLUCOSE 147*  BUN 12  CREATININE 0.76  CALCIUM 9.0  MG 1.7   GFR: Estimated Creatinine Clearance: 116.1 mL/min (by C-G formula based on SCr of 0.76 mg/dL). Liver Function Tests: No results for input(s): "AST", "ALT", "ALKPHOS", "BILITOT", "PROT", "ALBUMIN" in the last 168 hours.  No results for input(s): "LIPASE", "AMYLASE" in the last 168 hours. No results for input(s): "AMMONIA" in the last 168 hours. Coagulation Profile: No results for input(s): "INR", "PROTIME" in the last 168 hours. Cardiac Enzymes: No results for input(s): "CKTOTAL", "CKMB", "CKMBINDEX", "TROPONINI" in the last 168 hours. BNP (last 3 results) No results for input(s): "PROBNP" in the last 8760 hours. HbA1C: No results for input(s): "HGBA1C" in the last 72 hours. CBG: No results for input(s): "GLUCAP" in the last 168 hours. Lipid Profile: No results for input(s): "CHOL", "HDL", "LDLCALC", "TRIG", "CHOLHDL", "LDLDIRECT" in the last 72 hours. Thyroid Function Tests: No results for input(s): "TSH", "T4TOTAL", "FREET4", "T3FREE", "THYROIDAB" in the last 72 hours. Anemia Panel: No results for input(s): "VITAMINB12", "FOLATE", "FERRITIN", "TIBC", "IRON", "RETICCTPCT" in the last 72 hours. Sepsis Labs: No results for input(s): "PROCALCITON", "LATICACIDVEN" in the last 168 hours.  No  results found for this or any previous visit (from the past 240 hour(s)).       Radiology Studies: No results found.      Scheduled Meds:  ARIPiprazole  2 mg Oral Daily   cholecalciferol  400 Units Oral Daily   cyclobenzaprine  10 mg Oral QHS   diclofenac Sodium  2 g Topical QID   DULoxetine  60 mg Oral Daily   ferrous sulfate  325 mg Oral BID WC   gabapentin  600 mg Oral TID   magnesium oxide  400 mg Oral BID   metoprolol tartrate  12.5 mg Oral BID   mometasone-formoterol  2 puff Inhalation BID   mupirocin cream   Topical Daily   OLANZapine  15 mg Oral Daily   OLANZapine zydis  5 mg Oral BID AC   pantoprazole  40 mg Oral QAC breakfast   prazosin  1 mg Oral QHS   sodium bicarbonate  650 mg Oral Daily   Continuous Infusions:  sodium chloride Stopped (08/24/22 1347)     LOS: 55 days    Time spent: 35 minutes    Evelyn Docken A Kajuan Guyton, MD [facility]   If 7PM-7AM, please contact night-coverage [email or web address]  10/16/2022, 2:11 PM

## 2022-10-17 DIAGNOSIS — I10 Essential (primary) hypertension: Secondary | ICD-10-CM | POA: Diagnosis not present

## 2022-10-17 DIAGNOSIS — T1491XA Suicide attempt, initial encounter: Secondary | ICD-10-CM | POA: Diagnosis not present

## 2022-10-17 MED ORDER — METOPROLOL TARTRATE 12.5 MG HALF TABLET
12.5000 mg | ORAL_TABLET | Freq: Once | ORAL | Status: AC
  Administered 2022-10-17: 12.5 mg via ORAL
  Filled 2022-10-17: qty 1

## 2022-10-17 MED ORDER — STERILE WATER FOR INJECTION IJ SOLN
INTRAMUSCULAR | Status: AC
  Filled 2022-10-17: qty 10

## 2022-10-17 NOTE — Consult Note (Signed)
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 only tolerate paper tape.   Tylenol [Acetaminophen] Rash  and Other (See Comments)    Rash on face

## 2022-10-17 NOTE — Progress Notes (Signed)
PROGRESS NOTE    [name]  [id number] DOB: August 13, 1990 DOA: 08/22/2022 PCP: [name]   Brief Narrative: 32 year old with past medical history significant for adjustment disorder with mixed anxiety and depressed mood, Autism, bipolar 1 disorder, fragile X syndrome, seizure not on meds, schizoaffective disorder and multiple suicidal attempts in the last year presented to the ED after ingesting numerous medications in a suicide attempt.She reported taking #30 metoprolol 25mg  tablets, #60 Hydroxyzine 25mg , #7 Lexapro 20mg , #21 Gabapentin 600mg , #7 Flexeril 10mg , #7 protonix 40mg  and #7 Prazosin 1mg .  She was bradycardic and hypotensive on EMS arrival which improved with fluids.  Poison control was contacted as she was within 2 hours of injection and was given activated charcoal.  Patient was placed under IVC in the ED.  Poison control also recommended ICU admission for close monitoring of QTc and electrolytes possible pressors.  Patient was intubated and started on IV pressors and received glucagon.  She was a started also on ceftriaxone for pneumonia.  She was subsequently extubated 7/18 and transferred to the floor 7/19.  She has been under [facility] service since 08/25/2022.  Psychiatry continue to follow.  Did recommend inpatient psychiatric hospitalization.  Awaiting placement at [facility].     Assessment & Plan:   SI attempt with intentional overdose: -She presented after having taken multiple medications including: # 30 metoprolol 25mg  tablets, # 60 Hydroxyzine 25mg , # 7 Lexapro 20mg , # 21 Gabapentin 600mg , # 7 Flexeril 10mg , # 7 protonix 40mg  and # 7 Prazosin 1mg .  -Poison control was contacted.  Patient received charcoal in the ED.  She initially required pressors, glucagon, intubation mechanical ventilation. -Currently IVC.  This is being addressed by psychiatry. Patient  reporter suicidal ideation 9/7, was agitate.  9/09 Patient wrapped cord around her neck, hit rail of  bed, got agitated.  Patient with escalation of behavior since 9/07 Will likely need inpatient psych facility admission.  Psychiatry is following closely and managing.  Anxiety and Depression, Schizoaffective Disorder, Adjustment Disorder, bipolar disorder, fibromyalgia and auditory hallucination:  Continue medication per psych  Community-acquired pneumonia: Completed antibiotic  Essential hypertension: Continue metoprolol and prazosin.  Blood pressure is noted to be elevated.  Will increase the dose of metoprolol.  Sinus tachycardia: Specially during episodes of anxiety. D-dimer, troponin, TSH normal limits.  Metabolic  acidosis: Resolved Started weaning sodium bicarb,.   Hypomagnesemia: Replaced Deficiency anemia: Continue with iron supplement History of seizure disorder: Not on medications Asthma.  Nebulizer PRN Hypoalbuminemia encourage oral intake  Obesity Estimated body mass index is 36.03 kg/m as calculated from the following:   Height as of this encounter: 5\' 5"  (1.651 m).   Weight as of this encounter: 98.2 kg.   DVT prophylaxis: SCD Code Status: Full code Family Communication: no family at bedside.  Disposition Plan: Inpatient psychiatry   Consultants:  [facility] Psych     Subjective: Denies any chest pain shortness of breath.  No nausea vomiting.  Objective: Vitals:   10/16/22 0607 10/16/22 1942 10/16/22 2141 10/17/22 0753  BP: 138/69 (!) 116/98  (!) 146/67  Pulse: (!) 110 (!) 116 (!) 118 (!) 110  Resp: 18 16 19 18   Temp: 98 F (36.7 C) 97.8 F (36.6 C)  97.8 F (36.6 C)  TempSrc: Oral Oral  Oral  SpO2: 99% 100%  100%  Weight:      Height:        Intake/Output Summary (Last 24 hours) at 10/17/2022 0947 Last data filed at 10/17/2022 0730 Gross  per 24 hour  Intake 600 ml  Output --  Net 600 ml   Filed Weights   09/01/22 0300 09/02/22 0500 09/03/22 0402  Weight: 98.8 kg 99 kg 98.2 kg    Examination:  General appearance: Awake alert.  In no  distress Resp: Clear to auscultation bilaterally.  Normal effort Cardio: S1-S2 is tachycardic regular.  No S3-S4.  No rubs murmurs or bruit GI: Abdomen is soft.  Nontender nondistended.  Bowel sounds are present normal.  No masses organomegaly   Data Reviewed: I have personally reviewed following labs and imaging studies  CBC: Recent Labs  Lab 10/11/22 2035  WBC 10.0  HGB 11.7*  HCT 37.8  MCV 81.5  PLT 286   Basic Metabolic Panel: Recent Labs  Lab 10/11/22 2035  NA 135  K 3.9  CL 103  CO2 23  GLUCOSE 147*  BUN 12  CREATININE 0.76  CALCIUM 9.0  MG 1.7   GFR: Estimated Creatinine Clearance: 116.1 mL/min (by C-G formula based on SCr of 0.76 mg/dL).    Radiology Studies: No results found.   Scheduled Meds:  ARIPiprazole  2 mg Oral Daily   cholecalciferol  400 Units Oral Daily   cyclobenzaprine  10 mg Oral QHS   diclofenac Sodium  2 g Topical QID   DULoxetine  60 mg Oral Daily   ferrous sulfate  325 mg Oral BID WC   gabapentin  600 mg Oral TID   magnesium oxide  400 mg Oral BID   metoprolol tartrate  12.5 mg Oral BID   mometasone-formoterol  2 puff Inhalation BID   mupirocin cream   Topical Daily   OLANZapine  15 mg Oral Daily   OLANZapine zydis  5 mg Oral BID AC   pantoprazole  40 mg Oral QAC breakfast   prazosin  1 mg Oral QHS   sodium bicarbonate  650 mg Oral Daily   Continuous Infusions:  sodium chloride Stopped (08/24/22 1347)     LOS: 56 days    Osvaldo Shipper, MD [facility]   If 7PM-7AM, please contact night-coverage [email or web address]  10/17/2022, 9:47 AM

## 2022-10-17 NOTE — TOC Progression Note (Signed)
Transition of Care [facility]) - Progression Note    Patient Details  Name: [name] MRN: [id number] Date of Birth: May 24, 1990  Transition of Care [facility] CM/SW Contact  Lorri Frederick, LCSW Phone Number: 10/17/2022, 3:07 PM  Clinical Narrative:   IVC paperwork renewed, case # [id number].  [employer] called for service.          Expected Discharge Plan and Services                                               Social Determinants of Health (SDOH) Interventions SDOH Screenings   Food Insecurity: No Food Insecurity (07/29/2022)  Housing: Low Risk  (07/29/2022)  Transportation Needs: No Transportation Needs (07/29/2022)  Recent Concern: Transportation Needs - Unmet Transportation Needs (05/06/2022)  Utilities: Not At Risk (07/29/2022)  Alcohol Screen: Low Risk  (07/29/2022)  Depression (PHQ2-9): Medium Risk (03/10/2022)  Tobacco Use: Medium Risk (09/07/2022)    Readmission Risk Interventions    06/06/2022    3:38 PM  Readmission Risk Prevention Plan  Transportation Screening Complete  Medication Review (RN Care Manager) Complete  PCP or Specialist appointment within 3-5 days of discharge Complete  HRI or Home Care Consult Complete  SW Recovery Care/Counseling Consult Complete  Palliative Care Screening Not Applicable  Skilled Nursing Facility Not Applicable

## 2022-10-18 NOTE — Consult Note (Addendum)
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 Comments)    Pt states that this medication causes paralysis of the mouth, jaw locks up   Lithobid [Lithium] Other (See Comments)    Seizure-like activity    Roxicodone [Oxycodone] Other (See Comments)  Hallucinations    Seroquel [Quetiapine] Other (See Comments)    Severe drowsiness - Pt currently taking 12.5 mg BID, she is ok with this dose   Shellfish Allergy Anaphylaxis   Phenergan [Promethazine Hcl] Other (See Comments)    Chest pain     Prilosec [Omeprazole] Nausea And Vomiting and Other (See Comments)    Pt can take protonix with no problems    Sulfa Antibiotics Other (See Comments)    Chest pain    Tegretol [Carbamazepine] Nausea And Vomiting   Prozac [Fluoxetine] Other (See Comments)    Increased Depression and Suicidal thoughts   Tape Other (See Comments)    Skin tears, can only tolerate paper tape.   Tylenol [Acetaminophen] Rash and Other (See Comments)    Rash on face

## 2022-10-18 NOTE — Progress Notes (Signed)
PROGRESS NOTE    [name]  ZOX:[id number] DOB: November 05, 1990 DOA: 08/22/2022 PCP: [name]   Brief Narrative: 32 year old with past medical history significant for adjustment disorder with mixed anxiety and depressed mood, Autism, bipolar 1 disorder, fragile X syndrome, seizure not on meds, schizoaffective disorder and multiple suicidal attempts in the last year presented to the ED after ingesting numerous medications in a suicide attempt.She reported taking #30 metoprolol 25mg  tablets, #60 Hydroxyzine 25mg , #7 Lexapro 20mg , #21 Gabapentin 600mg , #7 Flexeril 10mg , #7 protonix 40mg  and #7 Prazosin 1mg .  She was bradycardic and hypotensive on EMS arrival which improved with fluids.  Poison control was contacted as she was within 2 hours of injection and was given activated charcoal.  Patient was placed under IVC in the ED.  Poison control also recommended ICU admission for close monitoring of QTc and electrolytes possible pressors.  Patient was intubated and started on IV pressors and received glucagon.  She was a started also on ceftriaxone for pneumonia.  She was subsequently extubated 7/18 and transferred to the floor 7/19.  She has been under [facility] service since 08/25/2022.  Psychiatry continue to follow.  Did recommend inpatient psychiatric hospitalization.  Awaiting placement at [facility].     Assessment & Plan:   SI attempt with intentional overdose: -She presented after having taken multiple medications including: # 30 metoprolol 25mg  tablets, # 60 Hydroxyzine 25mg , # 7 Lexapro 20mg , # 21 Gabapentin 600mg , # 7 Flexeril 10mg , # 7 protonix 40mg  and # 7 Prazosin 1mg .  -Poison control was contacted.  Patient received charcoal in the ED.  She initially required pressors, glucagon, intubation mechanical ventilation. -Currently IVC.  This is being addressed by psychiatry. Patient  reporter suicidal ideation 9/7, was agitate.  9/09 Patient wrapped cord around her neck, hit rail of  bed, got agitated.  Patient with escalation of behavior since 9/07 Will likely need inpatient psych facility admission.  Psychiatry is following closely and managing.  Anxiety and Depression, Schizoaffective Disorder, Adjustment Disorder, bipolar disorder, fibromyalgia and auditory hallucination:  Continue medication per psych  Community-acquired pneumonia: Completed antibiotic  Essential hypertension: Continue metoprolol and prazosin.  Due to elevated blood pressure readings dose of metoprolol was increased on 9/11.  Sinus tachycardia: Specially during episodes of anxiety. D-dimer, troponin, TSH normal limits.  Metabolic  acidosis: Resolved Started weaning sodium bicarb,.   Hypomagnesemia: Replaced Deficiency anemia: Continue with iron supplement History of seizure disorder: Not on medications Asthma.  Nebulizer PRN Hypoalbuminemia encourage oral intake  Obesity Estimated body mass index is 36.03 kg/m as calculated from the following:   Height as of this encounter: 5\' 5"  (1.651 m).   Weight as of this encounter: 98.2 kg.   DVT prophylaxis: SCD Code Status: Full code Family Communication: no family at bedside.  Disposition Plan: Inpatient psychiatry   Consultants:  [facility] Psych     Subjective: No complaints offered.  Specifically no chest pain or shortness of breath.  Objective: Vitals:   10/16/22 1942 10/16/22 2141 10/17/22 0753 10/17/22 2011  BP: (!) 116/98  (!) 146/67 118/64  Pulse: (!) 116 (!) 118 (!) 110 (!) 108  Resp: 16 19 18 16   Temp: 97.8 F (36.6 C)  97.8 F (36.6 C) 97.9 F (36.6 C)  TempSrc: Oral  Oral Oral  SpO2: 100%  100% 98%  Weight:      Height:        Intake/Output Summary (Last 24 hours) at 10/18/2022 1015 Last data filed at 10/17/2022 1950 Gross  per 24 hour  Intake 1330 ml  Output --  Net 1330 ml   Filed Weights   09/01/22 0300 09/02/22 0500 09/03/22 0402  Weight: 98.8 kg 99 kg 98.2 kg    Examination:  Awake alert.  In no  distress.   Data Reviewed:   CBC: Recent Labs  Lab 10/11/22 2035  WBC 10.0  HGB 11.7*  HCT 37.8  MCV 81.5  PLT 286   Basic Metabolic Panel: Recent Labs  Lab 10/11/22 2035  NA 135  K 3.9  CL 103  CO2 23  GLUCOSE 147*  BUN 12  CREATININE 0.76  CALCIUM 9.0  MG 1.7   GFR: Estimated Creatinine Clearance: 116.1 mL/min (by C-G formula based on SCr of 0.76 mg/dL).    Radiology Studies: No results found.   Scheduled Meds:  ARIPiprazole  2 mg Oral Daily   cholecalciferol  400 Units Oral Daily   cyclobenzaprine  10 mg Oral QHS   diclofenac Sodium  2 g Topical QID   DULoxetine  60 mg Oral Daily   ferrous sulfate  325 mg Oral BID WC   gabapentin  600 mg Oral TID   magnesium oxide  400 mg Oral BID   metoprolol tartrate  12.5 mg Oral BID   mometasone-formoterol  2 puff Inhalation BID   mupirocin cream   Topical Daily   OLANZapine  15 mg Oral Daily   OLANZapine zydis  5 mg Oral BID AC   pantoprazole  40 mg Oral QAC breakfast   prazosin  1 mg Oral QHS   sodium bicarbonate  650 mg Oral Daily   Continuous Infusions:  sodium chloride Stopped (08/24/22 1347)     LOS: 57 days    This is a no charge note.  Osvaldo Shipper, MD [facility]   If 7PM-7AM, please contact night-coverage [email or web address]  10/18/2022, 10:15 AM

## 2022-10-18 NOTE — TOC Progression Note (Signed)
Transition of Care [facility]) - Progression Note    Patient Details  Name: [name] MRN: [id number] Date of Birth: 1990-02-23  [facility] CM/SW Contact  Lorri Frederick, LCSW Phone Number: 10/18/2022, 1:15 PM  Clinical Narrative:   CSW returned call to pt care manager, [place], [phone].  Update left that pt plan remains transfer to inpatient psych once accepted.         Expected Discharge Plan and Services                                               Social Determinants of Health (SDOH) Interventions SDOH Screenings   Food Insecurity: No Food Insecurity (07/29/2022)  Housing: Low Risk  (07/29/2022)  Transportation Needs: No Transportation Needs (07/29/2022)  Recent Concern: Transportation Needs - Unmet Transportation Needs (05/06/2022)  Utilities: Not At Risk (07/29/2022)  Alcohol Screen: Low Risk  (07/29/2022)  Depression (PHQ2-9): Medium Risk (03/10/2022)  Tobacco Use: Medium Risk (09/07/2022)    Readmission Risk Interventions    06/06/2022    3:38 PM  Readmission Risk Prevention Plan  Transportation Screening Complete  Medication Review (RN Care Manager) Complete  PCP or Specialist appointment within 3-5 days of discharge Complete  HRI or Home Care Consult Complete  SW Recovery Care/Counseling Consult Complete  Palliative Care Screening Not Applicable  Skilled Nursing Facility Not Applicable

## 2022-10-19 ENCOUNTER — Other Ambulatory Visit: Payer: Self-pay

## 2022-10-19 ENCOUNTER — Inpatient Hospital Stay (HOSPITAL_COMMUNITY)
Admission: AD | Admit: 2022-10-19 | Discharge: 2022-10-31 | DRG: 885 | Disposition: A | Discharge status: Home / Self Care | Payer: Medicare PPO | Source: Intra-hospital | Attending: Psychiatry | Admitting: Psychiatry

## 2022-10-19 ENCOUNTER — Encounter (HOSPITAL_COMMUNITY): Payer: Self-pay | Admitting: Psychiatry

## 2022-10-19 DIAGNOSIS — Z818 Family history of other mental and behavioral disorders: Secondary | ICD-10-CM | POA: Diagnosis not present

## 2022-10-19 DIAGNOSIS — M79661 Pain in right lower leg: Secondary | ICD-10-CM | POA: Diagnosis present

## 2022-10-19 DIAGNOSIS — M797 Fibromyalgia: Secondary | ICD-10-CM | POA: Diagnosis present

## 2022-10-19 DIAGNOSIS — Z560 Unemployment, unspecified: Secondary | ICD-10-CM

## 2022-10-19 DIAGNOSIS — Z7289 Other problems related to lifestyle: Secondary | ICD-10-CM | POA: Diagnosis not present

## 2022-10-19 DIAGNOSIS — G4730 Sleep apnea, unspecified: Secondary | ICD-10-CM | POA: Diagnosis present

## 2022-10-19 DIAGNOSIS — J45909 Unspecified asthma, uncomplicated: Secondary | ICD-10-CM | POA: Diagnosis present

## 2022-10-19 DIAGNOSIS — Z91041 Radiographic dye allergy status: Secondary | ICD-10-CM

## 2022-10-19 DIAGNOSIS — R52 Pain, unspecified: Secondary | ICD-10-CM | POA: Diagnosis not present

## 2022-10-19 DIAGNOSIS — F4310 Post-traumatic stress disorder, unspecified: Secondary | ICD-10-CM | POA: Diagnosis present

## 2022-10-19 DIAGNOSIS — F4323 Adjustment disorder with mixed anxiety and depressed mood: Secondary | ICD-10-CM | POA: Diagnosis present

## 2022-10-19 DIAGNOSIS — Z91018 Allergy to other foods: Secondary | ICD-10-CM

## 2022-10-19 DIAGNOSIS — Z148 Genetic carrier of other disease: Secondary | ICD-10-CM

## 2022-10-19 DIAGNOSIS — F845 Asperger's syndrome: Secondary | ICD-10-CM | POA: Diagnosis present

## 2022-10-19 DIAGNOSIS — K5909 Other constipation: Secondary | ICD-10-CM | POA: Diagnosis present

## 2022-10-19 DIAGNOSIS — Z765 Malingerer [conscious simulation]: Secondary | ICD-10-CM | POA: Diagnosis not present

## 2022-10-19 DIAGNOSIS — F250 Schizoaffective disorder, bipolar type: Secondary | ICD-10-CM | POA: Diagnosis present

## 2022-10-19 DIAGNOSIS — Z888 Allergy status to other drugs, medicaments and biological substances status: Secondary | ICD-10-CM

## 2022-10-19 DIAGNOSIS — F603 Borderline personality disorder: Secondary | ICD-10-CM | POA: Diagnosis present

## 2022-10-19 DIAGNOSIS — M1990 Unspecified osteoarthritis, unspecified site: Secondary | ICD-10-CM | POA: Diagnosis present

## 2022-10-19 DIAGNOSIS — Z9151 Personal history of suicidal behavior: Secondary | ICD-10-CM | POA: Diagnosis not present

## 2022-10-19 DIAGNOSIS — T1491XA Suicide attempt, initial encounter: Secondary | ICD-10-CM | POA: Diagnosis not present

## 2022-10-19 DIAGNOSIS — K219 Gastro-esophageal reflux disease without esophagitis: Secondary | ICD-10-CM | POA: Diagnosis present

## 2022-10-19 DIAGNOSIS — R000 Tachycardia, unspecified: Secondary | ICD-10-CM | POA: Diagnosis present

## 2022-10-19 DIAGNOSIS — Z882 Allergy status to sulfonamides status: Secondary | ICD-10-CM

## 2022-10-19 DIAGNOSIS — Z7951 Long term (current) use of inhaled steroids: Secondary | ICD-10-CM

## 2022-10-19 DIAGNOSIS — F333 Major depressive disorder, recurrent, severe with psychotic symptoms: Secondary | ICD-10-CM | POA: Diagnosis present

## 2022-10-19 DIAGNOSIS — Z9109 Other allergy status, other than to drugs and biological substances: Secondary | ICD-10-CM

## 2022-10-19 DIAGNOSIS — Z87891 Personal history of nicotine dependence: Secondary | ICD-10-CM

## 2022-10-19 DIAGNOSIS — R45851 Suicidal ideations: Secondary | ICD-10-CM | POA: Diagnosis present

## 2022-10-19 DIAGNOSIS — W2201XA Walked into wall, initial encounter: Secondary | ICD-10-CM | POA: Diagnosis not present

## 2022-10-19 DIAGNOSIS — Z91013 Allergy to seafood: Secondary | ICD-10-CM

## 2022-10-19 DIAGNOSIS — F411 Generalized anxiety disorder: Secondary | ICD-10-CM | POA: Diagnosis present

## 2022-10-19 DIAGNOSIS — F4325 Adjustment disorder with mixed disturbance of emotions and conduct: Secondary | ICD-10-CM | POA: Diagnosis present

## 2022-10-19 DIAGNOSIS — Z91030 Bee allergy status: Secondary | ICD-10-CM | POA: Diagnosis not present

## 2022-10-19 DIAGNOSIS — Z79899 Other long term (current) drug therapy: Secondary | ICD-10-CM

## 2022-10-19 DIAGNOSIS — M79604 Pain in right leg: Principal | ICD-10-CM

## 2022-10-19 DIAGNOSIS — Z885 Allergy status to narcotic agent status: Secondary | ICD-10-CM

## 2022-10-19 MED ORDER — PRAZOSIN HCL 1 MG PO CAPS
1.0000 mg | ORAL_CAPSULE | Freq: Every day | ORAL | Status: DC
  Administered 2022-10-19 – 2022-10-30 (×11): 1 mg via ORAL
  Filled 2022-10-19 (×18): qty 1

## 2022-10-19 MED ORDER — HYDROXYZINE HCL 25 MG PO TABS
25.0000 mg | ORAL_TABLET | Freq: Three times a day (TID) | ORAL | Status: DC | PRN
  Administered 2022-10-19 – 2022-10-31 (×12): 25 mg via ORAL
  Filled 2022-10-19 (×12): qty 1

## 2022-10-19 MED ORDER — MAGNESIUM OXIDE -MG SUPPLEMENT 400 (240 MG) MG PO TABS: 400.0000 mg | ORAL_TABLET | Freq: Two times a day (BID) | ORAL | Status: DC

## 2022-10-19 MED ORDER — METOPROLOL TARTRATE 12.5 MG HALF TABLET
12.5000 mg | ORAL_TABLET | Freq: Two times a day (BID) | ORAL | Status: DC
  Administered 2022-10-19 – 2022-10-24 (×9): 12.5 mg via ORAL
  Filled 2022-10-19 (×14): qty 1

## 2022-10-19 MED ORDER — DULOXETINE HCL 60 MG PO CPEP
60.0000 mg | ORAL_CAPSULE | Freq: Every day | ORAL | Status: DC
  Administered 2022-10-20 – 2022-10-31 (×12): 60 mg via ORAL
  Filled 2022-10-19 (×14): qty 1

## 2022-10-19 MED ORDER — DULOXETINE HCL 60 MG PO CPEP: 60.0000 mg | ORAL_CAPSULE | Freq: Every day | ORAL | Status: DC

## 2022-10-19 MED ORDER — ONDANSETRON 4 MG PO TBDP: 4.0000 mg | ORAL_TABLET | Freq: Three times a day (TID) | ORAL | Status: DC | PRN

## 2022-10-19 MED ORDER — ARIPIPRAZOLE 2 MG PO TABS: 2.0000 mg | ORAL_TABLET | Freq: Every day | ORAL | Status: DC

## 2022-10-19 MED ORDER — FERROUS SULFATE 325 (65 FE) MG PO TABS: 325.0000 mg | ORAL_TABLET | Freq: Two times a day (BID) | ORAL | Status: DC

## 2022-10-19 MED ORDER — OLANZAPINE 5 MG PO TBDP
5.0000 mg | ORAL_TABLET | Freq: Two times a day (BID) | ORAL | Status: DC
  Administered 2022-10-19 – 2022-10-30 (×23): 5 mg via ORAL
  Filled 2022-10-19 (×35): qty 1

## 2022-10-19 MED ORDER — POLYETHYLENE GLYCOL 3350 17 G PO PACK: 17.0000 g | PACK | Freq: Two times a day (BID) | ORAL | Status: DC | PRN

## 2022-10-19 MED ORDER — LORAZEPAM 1 MG PO TABS: 1.0000 mg | ORAL_TABLET | Freq: Two times a day (BID) | ORAL | Status: DC | PRN

## 2022-10-19 MED ORDER — MOMETASONE FURO-FORMOTEROL FUM 200-5 MCG/ACT IN AERO: 2.0000 | INHALATION_SPRAY | Freq: Two times a day (BID) | RESPIRATORY_TRACT | Status: DC

## 2022-10-19 MED ORDER — POLYETHYLENE GLYCOL 3350 17 G PO PACK
17.0000 g | PACK | Freq: Two times a day (BID) | ORAL | Status: DC | PRN
  Administered 2022-10-19 – 2022-10-27 (×8): 17 g via ORAL
  Filled 2022-10-19 (×8): qty 1

## 2022-10-19 MED ORDER — CHOLECALCIFEROL 10 MCG (400 UNIT) PO TABS
400.0000 [IU] | ORAL_TABLET | Freq: Every day | ORAL | Status: DC
  Administered 2022-10-20 – 2022-10-31 (×12): 400 [IU] via ORAL
  Filled 2022-10-19 (×14): qty 1

## 2022-10-19 MED ORDER — MOMETASONE FURO-FORMOTEROL FUM 200-5 MCG/ACT IN AERO
2.0000 | INHALATION_SPRAY | Freq: Two times a day (BID) | RESPIRATORY_TRACT | Status: DC
  Administered 2022-10-19 – 2022-10-31 (×24): 2 via RESPIRATORY_TRACT
  Filled 2022-10-19 (×2): qty 8.8

## 2022-10-19 MED ORDER — OLANZAPINE 10 MG IM SOLR: 5.0000 mg | Freq: Three times a day (TID) | INTRAMUSCULAR | Status: DC | PRN

## 2022-10-19 MED ORDER — OLANZAPINE 5 MG PO TABS: 5.0000 mg | ORAL_TABLET | Freq: Three times a day (TID) | ORAL | Status: DC | PRN

## 2022-10-19 MED ORDER — MAGNESIUM OXIDE -MG SUPPLEMENT 400 (240 MG) MG PO TABS
400.0000 mg | ORAL_TABLET | Freq: Two times a day (BID) | ORAL | Status: DC
  Administered 2022-10-19 – 2022-10-31 (×24): 400 mg via ORAL
  Filled 2022-10-19 (×32): qty 1

## 2022-10-19 MED ORDER — DIPHENHYDRAMINE HCL 25 MG PO CAPS: 50.0000 mg | ORAL_CAPSULE | Freq: Three times a day (TID) | ORAL | Status: DC | PRN

## 2022-10-19 MED ORDER — DICLOFENAC SODIUM 1 % EX GEL: 2.0000 g | Freq: Four times a day (QID) | CUTANEOUS | Status: DC

## 2022-10-19 MED ORDER — OLANZAPINE 5 MG PO TBDP: 5.0000 mg | ORAL_TABLET | Freq: Two times a day (BID) | ORAL | Status: DC

## 2022-10-19 MED ORDER — OLANZAPINE 7.5 MG PO TABS
15.0000 mg | ORAL_TABLET | Freq: Every evening | ORAL | Status: DC
  Administered 2022-10-19 – 2022-10-20 (×2): 15 mg via ORAL
  Filled 2022-10-19 (×4): qty 2

## 2022-10-19 MED ORDER — ALBUTEROL SULFATE HFA 108 (90 BASE) MCG/ACT IN AERS
2.0000 | INHALATION_SPRAY | Freq: Four times a day (QID) | RESPIRATORY_TRACT | Status: DC | PRN
  Administered 2022-10-24: 2 via RESPIRATORY_TRACT
  Filled 2022-10-19: qty 6.7

## 2022-10-19 MED ORDER — SODIUM BICARBONATE 650 MG PO TABS: 650.0000 mg | ORAL_TABLET | Freq: Every day | ORAL | Status: DC

## 2022-10-19 MED ORDER — OLANZAPINE 15 MG PO TABS: 15.0000 mg | ORAL_TABLET | Freq: Every evening | ORAL | Status: DC

## 2022-10-19 MED ORDER — METOPROLOL TARTRATE 25 MG PO TABS: 12.5000 mg | ORAL_TABLET | Freq: Two times a day (BID) | ORAL | Status: DC

## 2022-10-19 MED ORDER — IBUPROFEN 600 MG PO TABS
600.0000 mg | ORAL_TABLET | Freq: Three times a day (TID) | ORAL | Status: DC | PRN
  Administered 2022-10-20 – 2022-10-30 (×12): 600 mg via ORAL
  Filled 2022-10-19 (×12): qty 1

## 2022-10-19 MED ORDER — PANTOPRAZOLE SODIUM 40 MG PO TBEC
40.0000 mg | DELAYED_RELEASE_TABLET | Freq: Every day | ORAL | Status: DC
  Administered 2022-10-20 – 2022-10-31 (×12): 40 mg via ORAL
  Filled 2022-10-19 (×16): qty 1

## 2022-10-19 MED ORDER — HALOPERIDOL LACTATE 5 MG/ML IJ SOLN: 5.0000 mg | Freq: Three times a day (TID) | INTRAMUSCULAR | Status: DC | PRN

## 2022-10-19 MED ORDER — SENNOSIDES-DOCUSATE SODIUM 8.6-50 MG PO TABS
1.0000 | ORAL_TABLET | Freq: Two times a day (BID) | ORAL | Status: DC | PRN
  Administered 2022-10-19 – 2022-10-27 (×7): 1 via ORAL
  Filled 2022-10-19 (×7): qty 1

## 2022-10-19 MED ORDER — OLANZAPINE 5 MG PO TABS
5.0000 mg | ORAL_TABLET | Freq: Two times a day (BID) | ORAL | Status: DC | PRN
  Administered 2022-10-23 – 2022-10-30 (×6): 5 mg via ORAL
  Filled 2022-10-19 (×6): qty 1

## 2022-10-19 MED ORDER — DIPHENHYDRAMINE HCL 50 MG/ML IJ SOLN: 50.0000 mg | Freq: Three times a day (TID) | INTRAMUSCULAR | Status: DC | PRN

## 2022-10-19 MED ORDER — IBUPROFEN 600 MG PO TABS: 600.0000 mg | ORAL_TABLET | Freq: Three times a day (TID) | ORAL | Status: DC | PRN

## 2022-10-19 MED ORDER — ONDANSETRON 4 MG PO TBDP
4.0000 mg | ORAL_TABLET | Freq: Three times a day (TID) | ORAL | Status: DC | PRN
  Administered 2022-10-29: 4 mg via ORAL
  Filled 2022-10-19: qty 1

## 2022-10-19 MED ORDER — HALOPERIDOL 5 MG PO TABS: 5.0000 mg | ORAL_TABLET | Freq: Three times a day (TID) | ORAL | Status: DC | PRN

## 2022-10-19 MED ORDER — SODIUM BICARBONATE 650 MG PO TABS
650.0000 mg | ORAL_TABLET | Freq: Every day | ORAL | Status: DC
  Administered 2022-10-20 – 2022-10-31 (×12): 650 mg via ORAL
  Filled 2022-10-19 (×14): qty 1

## 2022-10-19 MED ORDER — CHOLECALCIFEROL 10 MCG (400 UNIT) PO TABS: 400.0000 [IU] | ORAL_TABLET | Freq: Every day | ORAL | Status: DC

## 2022-10-19 MED ORDER — OLANZAPINE 10 MG IM SOLR: 5.0000 mg | Freq: Two times a day (BID) | INTRAMUSCULAR | Status: DC | PRN

## 2022-10-19 MED ORDER — ALUM & MAG HYDROXIDE-SIMETH 200-200-20 MG/5ML PO SUSP
30.0000 mL | ORAL | Status: DC | PRN
  Administered 2022-10-27: 30 mL via ORAL
  Filled 2022-10-19: qty 30

## 2022-10-19 MED ORDER — DICLOFENAC SODIUM 1 % EX GEL
2.0000 g | Freq: Two times a day (BID) | CUTANEOUS | Status: DC | PRN
  Administered 2022-10-19 – 2022-10-21 (×5): 2 g via TOPICAL
  Filled 2022-10-19: qty 100

## 2022-10-19 MED ORDER — ARIPIPRAZOLE 2 MG PO TABS
2.0000 mg | ORAL_TABLET | Freq: Every day | ORAL | Status: DC
  Administered 2022-10-20 – 2022-10-31 (×12): 2 mg via ORAL
  Filled 2022-10-19 (×14): qty 1

## 2022-10-19 MED ORDER — LORAZEPAM 2 MG/ML IJ SOLN: 2.0000 mg | Freq: Three times a day (TID) | INTRAMUSCULAR | Status: DC | PRN

## 2022-10-19 MED ORDER — CYCLOBENZAPRINE HCL 10 MG PO TABS
10.0000 mg | ORAL_TABLET | Freq: Every day | ORAL | Status: DC
  Administered 2022-10-19 – 2022-10-30 (×12): 10 mg via ORAL
  Filled 2022-10-19 (×18): qty 1

## 2022-10-19 MED ORDER — GABAPENTIN 300 MG PO CAPS
600.0000 mg | ORAL_CAPSULE | Freq: Once | ORAL | Status: AC
  Administered 2022-10-19: 600 mg via ORAL
  Filled 2022-10-19: qty 2

## 2022-10-19 MED ORDER — FERROUS SULFATE 325 (65 FE) MG PO TABS
325.0000 mg | ORAL_TABLET | Freq: Two times a day (BID) | ORAL | Status: DC
  Administered 2022-10-19 – 2022-10-31 (×24): 325 mg via ORAL
  Filled 2022-10-19 (×31): qty 1

## 2022-10-19 MED ORDER — GABAPENTIN 300 MG PO CAPS
600.0000 mg | ORAL_CAPSULE | Freq: Three times a day (TID) | ORAL | Status: DC
  Administered 2022-10-19 – 2022-10-31 (×35): 600 mg via ORAL
  Filled 2022-10-19 (×47): qty 2

## 2022-10-19 MED ORDER — LORAZEPAM 1 MG PO TABS: 2.0000 mg | ORAL_TABLET | Freq: Three times a day (TID) | ORAL | Status: DC | PRN

## 2022-10-19 MED ORDER — SENNOSIDES-DOCUSATE SODIUM 8.6-50 MG PO TABS: 1.0000 | ORAL_TABLET | Freq: Two times a day (BID) | ORAL | Status: DC | PRN

## 2022-10-19 NOTE — Plan of Care (Signed)
  Problem: Education: Goal: Knowledge of General Education information will improve Description Including pain rating scale, medication(s)/side effects and non-pharmacologic comfort measures Outcome: Progressing   

## 2022-10-19 NOTE — Tx Team (Signed)
Initial Treatment Plan 10/19/2022 5:01 PM Allinson Portelli ONG:[id number]    PATIENT STRESSORS: Medication change or noncompliance   Occupational concerns     PATIENT STRENGTHS: Capable of independent living  General fund of knowledge  Supportive family/friends    PATIENT IDENTIFIED PROBLEMS: "Find another CST"  "Attend groups"                   DISCHARGE CRITERIA:  Improved stabilization in mood, thinking, and/or behavior Need for constant or close observation no longer present  PRELIMINARY DISCHARGE PLAN: Return to previous living arrangement  PATIENT/FAMILY INVOLVEMENT: This treatment plan has been presented to and reviewed with the patient, [name].  The patient has been given the opportunity to ask questions and make suggestions.  Edwyna Perfect, RN 10/19/2022, 5:01 PM
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
## 2022-10-19 NOTE — Progress Notes (Addendum)
PROGRESS NOTE    [name]  YNW:[id number] DOB: [birth year] DOA: 08/22/2022 PCP: [name]   Brief Narrative: 32 year old with past medical history significant for adjustment disorder with mixed anxiety and depressed mood, Autism, bipolar 1 disorder, fragile X syndrome, seizure not on meds, schizoaffective disorder and multiple suicidal attempts in the last year presented to the ED after ingesting numerous medications in a suicide attempt.She reported taking #30 metoprolol 25mg  tablets, #60 Hydroxyzine 25mg , #7 Lexapro 20mg , #21 Gabapentin 600mg , #7 Flexeril 10mg , #7 protonix 40mg  and #7 Prazosin 1mg .  She was bradycardic and hypotensive on EMS arrival which improved with fluids.  Poison control was contacted as she was within 2 hours of injection and was given activated charcoal.  Patient was placed under IVC in the ED.  Poison control also recommended ICU admission for close monitoring of QTc and electrolytes possible pressors.  Patient was intubated and started on IV pressors and received glucagon.  She was a started also on ceftriaxone for pneumonia.  She was subsequently extubated 7/18 and transferred to the floor 7/19.  She has been under [facility] service since 08/25/2022.  Psychiatry continue to follow.  Did recommend inpatient psychiatric hospitalization.  Awaiting [facility] at [facility].     Assessment & Plan:   SI attempt with intentional overdose: -She presented after having taken multiple medications including: # 30 metoprolol 25mg  tablets, # 60 Hydroxyzine 25mg , # 7 Lexapro 20mg , # 21 Gabapentin 600mg , # 7 Flexeril 10mg , # 7 protonix 40mg  and # 7 Prazosin 1mg .  -Poison control was contacted.  Patient received charcoal in the ED.  She initially required pressors, glucagon, intubation mechanical ventilation. -Currently IVC.  This is being addressed by psychiatry. Patient  reporter suicidal ideation 9/7, was agitate.  9/09 Patient wrapped cord around her neck, hit rail of  bed, got agitated.  Patient with escalation of behavior since 9/07 Will likely need inpatient psych facility admission.  Psychiatry is following closely and managing.  Anxiety and Depression, Schizoaffective Disorder, Adjustment Disorder, bipolar disorder, fibromyalgia and auditory hallucination:  Continue medications per psych  Community-acquired pneumonia: Completed antibiotic  Essential hypertension: Continue metoprolol and prazosin.  Due to elevated blood pressure patient was given extra dose of Metoprolol on 9/11. Dose can be increased to 25mg  twice daily if BP remains elevated.   Sinus tachycardia: Specially during episodes of anxiety. D-dimer, troponin, TSH normal limits. Seems to be better.  Metabolic  acidosis: Resolved Started weaning sodium bicarb,.   Hypomagnesemia: Replaced Deficiency anemia: Continue with iron supplement History of seizure disorder: Not on medications Asthma.  Nebulizer PRN Hypoalbuminemia encourage oral intake  Obesity Estimated body mass index is 36.03 kg/m as calculated from the following:   Height as of this encounter: 5\' 5"  (1.651 m).   Weight as of this encounter: 98.2 kg.   DVT prophylaxis: SCD Code Status: Full code Family Communication: no family at bedside.  Disposition Plan: Inpatient psychiatry   Consultants:  [facility] Psych     Subjective: Feels well.  No complaints offered.  Objective: Vitals:   10/17/22 0753 10/17/22 2011 10/18/22 1956 10/18/22 2000  BP: (!) 146/67 118/64 133/75 (!) 130/95  Pulse: (!) 110 (!) 108 (!) 105 87  Resp: 18 16 16 17   Temp: 97.8 F (36.6 C) 97.9 F (36.6 C) 98.2 F (36.8 C) 97.9 F (36.6 C)  TempSrc: Oral Oral Oral Oral  SpO2: 100% 98% 100% 97%  Weight:      Height:       No intake  or output data in the 24 hours ending 10/19/22 0944  Filed Weights   09/01/22 0300 09/02/22 0500 09/03/22 0402  Weight: 98.8 kg 99 kg 98.2 kg    Examination:  Awake alert.  In no distress.   Data  Reviewed:   Patient generally tends to decline blood work.  Labs last checked on 9/5.  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  CBC: No results for input(s): "WBC", "NEUTROABS", "HGB", "HCT", "MCV", "PLT" in the last 168 hours.  Basic Metabolic Panel: No results for input(s): "NA", "K", "CL", "CO2", "GLUCOSE", "BUN", "CREATININE", "CALCIUM", "MG", "PHOS" in the last 168 hours.  GFR: Estimated Creatinine Clearance: 116.1 mL/min (by C-G formula based on SCr of 0.76 mg/dL).    Radiology Studies: No results found.   Scheduled Meds:  ARIPiprazole  2 mg Oral Daily   cholecalciferol  400 Units Oral Daily   cyclobenzaprine  10 mg Oral QHS   diclofenac Sodium  2 g Topical QID   DULoxetine  60 mg Oral Daily   ferrous sulfate  325 mg Oral BID WC   gabapentin  600 mg Oral TID   magnesium oxide  400 mg Oral BID   metoprolol tartrate  12.5 mg Oral BID   mometasone-formoterol  2 puff Inhalation BID   mupirocin cream   Topical Daily   OLANZapine  15 mg Oral Daily   OLANZapine zydis  5 mg Oral BID AC   pantoprazole  40 mg Oral QAC breakfast   prazosin  1 mg Oral QHS   sodium bicarbonate  650 mg Oral Daily   Continuous Infusions:  sodium chloride Stopped (08/24/22 1347)     LOS: 58 days    This is a no charge note.  Osvaldo Shipper, MD [facility]   If 7PM-7AM, please contact night-coverage [email or web address]  10/19/2022, 9:44 AM

## 2022-10-19 NOTE — Progress Notes (Signed)
Pt discharged to [facility]. Left unit ambulatory per pt's request accompanied by police officer and Dr. Viviano Simas. Cell phone retrieved from security and given to pt. Pt received a large black bag with personal belongings including a smaller black bag. No concerns verbalized.

## 2022-10-19 NOTE — Progress Notes (Signed)
   10/19/22 2133  Psych Admission Type (Psych Patients Only)  Admission Status Involuntary  Psychosocial Assessment  Patient Complaints None  Eye Contact Brief  Facial Expression Flat  Affect Anxious  Speech Logical/coherent  Interaction Assertive  Motor Activity Restless  Appearance/Hygiene Improved  Behavior Characteristics Cooperative;Appropriate to situation  Mood Anxious;Pleasant  Thought Process  Coherency WDL  Content WDL  Delusions None reported or observed  Perception WDL  Hallucination None reported or observed  Judgment Poor  Confusion None  Danger to Self  Current suicidal ideation? Denies  Self-Injurious Behavior No self-injurious ideation or behavior indicators observed or expressed   Agreement Not to Harm Self Yes  Description of Agreement verbal  Danger to Others Abnormal  Harmful Behavior to others No threats or harm toward other people  Destructive Behavior No threats or harm toward property

## 2022-10-19 NOTE — Progress Notes (Signed)
Report called and given to Herbert Seta, RN at [facility] and pt to be transported momentarily.

## 2022-10-19 NOTE — TOC Transition Note (Signed)
Transition of Care [facility]) - CM/SW Discharge Note   Patient Details  Name: [name] MRN: [id number] Date of Birth: 1990/03/14  [facility]) CM/SW Contact:  Lorri Frederick, LCSW Phone Number: 10/19/2022, 1:46 PM   Clinical Narrative:   Pt discharging to [facility], [address]. Dr Octavia Bruckner is accepting MD.     Final next level of care: Psychiatric Hospital Barriers to Discharge: Barriers Resolved   Patient Goals and CMS Choice      Discharge Placement                  Patient to be transferred to facility by: [facility] and Services Additional resources added to the After Visit Summary for                                       Social Determinants of Health (SDOH) Interventions SDOH Screenings   Food Insecurity: No Food Insecurity (07/29/2022)  Housing: Low Risk  (07/29/2022)  Transportation Needs: No Transportation Needs (07/29/2022)  Recent Concern: Transportation Needs - Unmet Transportation Needs (05/06/2022)  Utilities: Not At Risk (07/29/2022)  Alcohol Screen: Low Risk  (07/29/2022)  Depression (PHQ2-9): Medium Risk (03/10/2022)  Tobacco Use: Medium Risk (09/07/2022)     Readmission Risk Interventions    06/06/2022    3:38 PM  Readmission Risk Prevention Plan  Transportation Screening Complete  Medication Review (RN Care Manager) Complete  PCP or Specialist appointment within 3-5 days of discharge Complete  HRI or Home Care Consult Complete  SW Recovery Care/Counseling Consult Complete  Palliative Care Screening Not Applicable  Skilled Nursing Facility Not Applicable

## 2022-10-19 NOTE — TOC Progression Note (Addendum)
Transition of Care [facility]) - Progression Note    Patient Details  Name: [name] MRN: [id number] Date of Birth: Jul 22, 1990  Transition of Care [facility]) CM/SW Contact  Lorri Frederick, LCSW Phone Number: 10/19/2022, 11:58 AM  Clinical Narrative:   Pt accepted to [facility], 402-1. Dr Octavia Bruckner is accepting MD.   1330: Pt informed of acceptance at [facility]. CSW called [facility] police for transport.        Expected Discharge Plan and Services         Expected Discharge Date: 10/19/22                                     Social Determinants of Health (SDOH) Interventions SDOH Screenings   Food Insecurity: No Food Insecurity (07/29/2022)  Housing: Low Risk  (07/29/2022)  Transportation Needs: No Transportation Needs (07/29/2022)  Recent Concern: Transportation Needs - Unmet Transportation Needs (05/06/2022)  Utilities: Not At Risk (07/29/2022)  Alcohol Screen: Low Risk  (07/29/2022)  Depression (PHQ2-9): Medium Risk (03/10/2022)  Tobacco Use: Medium Risk (09/07/2022)    Readmission Risk Interventions    06/06/2022    3:38 PM  Readmission Risk Prevention Plan  Transportation Screening Complete  Medication Review (RN Care Manager) Complete  PCP or Specialist appointment within 3-5 days of discharge Complete  HRI or Home Care Consult Complete  SW Recovery Care/Counseling Consult Complete  Palliative Care Screening Not Applicable  Skilled Nursing Facility Not Applicable

## 2022-10-19 NOTE — Consult Note (Addendum)
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 mg, Oral, BID PRN, Cinderella, Margaret A, 1 mg at 10/19/22 1310   magnesium oxide (MAG-OX) tablet 400 mg, 400 mg, Oral, BID, Regalado, Belkys A, MD, 400 mg at 10/19/22 [phone]  metoprolol tartrate (LOPRESSOR) tablet 12.5 mg, 12.5 mg, Oral, BID, Osvaldo Shipper, MD, 12.5 mg at 10/19/22 0853   mometasone-formoterol (DULERA) 200-5 MCG/ACT inhaler 2 puff, 2 puff, Inhalation, BID, Alanda Slim, Taye T, MD, 2 puff at 10/19/22 0901   mupirocin cream (BACTROBAN) 2 %, , Topical, Daily, Marguerita Merles [place], [place], Given at 10/17/22 0910   OLANZapine (ZYPREXA) injection 10 mg, 10 mg, Intramuscular, Daily PRN, Kizzie Ide B, MD, 10 mg at 10/15/22 2359   OLANZapine (ZYPREXA) tablet 15 mg, 15 mg, Oral, Daily, Cinderella, Margaret A, 15 mg at 10/18/22 2000   OLANZapine zydis (ZYPREXA) disintegrating tablet 5 mg, 5 mg, Oral, BID AC, Mariel Craft, MD, 5 mg at 10/19/22 1244   ondansetron (ZOFRAN-ODT) disintegrating tablet 4 mg, 4 mg, Oral, Q8H PRN, Sheikh, Omair Latif, DO, 4 mg at 10/14/22 2205   Oral care mouth rinse, 15 mL, Mouth Rinse, PRN, Osvaldo Shipper, MD   pantoprazole (PROTONIX) EC tablet 40 mg, 40 mg, Oral, QAC breakfast, Gonfa, Taye T, MD, 40 mg  at 10/19/22 0853   polyethylene glycol (MIRALAX / GLYCOLAX) packet 17 g, 17 g, Oral, BID PRN, Alanda Slim, Taye T, MD, 17 g at 10/18/22 1659   prazosin (MINIPRESS) capsule 1 mg, 1 mg, Oral, QHS, Gonfa, Taye T, MD, 1 mg at 10/18/22 2132   senna-docusate (Senokot-S) tablet 1 tablet, 1 tablet, Oral, BID PRN, Candelaria Stagers T, MD, 1 tablet at 10/18/22 1659   sodium bicarbonate tablet 650 mg, 650 mg, Oral, Daily, Regalado, Belkys A, MD, 650 mg at 10/19/22 0853  Allergies: Allergies  Allergen Reactions   Bee Venom Anaphylaxis   Coconut Flavor Anaphylaxis and Rash   Fish Allergy Anaphylaxis   Geodon [Ziprasidone Hcl] Other (See Comments)    Pt states that this medication causes paralysis of the mouth.     Haloperidol And Related Other (See Comments)    Pt states that this medication causes paralysis of the mouth, jaw locks up   Lithobid [Lithium] Other (See Comments)    Seizure-like activity    Roxicodone [Oxycodone] Other (See Comments)    Hallucinations    Seroquel [Quetiapine] Other (See Comments)    Severe drowsiness - Pt currently taking 12.5 mg BID, she is ok with this dose   Shellfish Allergy Anaphylaxis   Phenergan [Promethazine Hcl] Other (See Comments)    Chest pain     Prilosec [Omeprazole] Nausea And Vomiting and Other (See Comments)    Pt can take protonix with no problems    Sulfa Antibiotics Other (See Comments)    Chest pain    Tegretol [Carbamazepine] Nausea And Vomiting   Prozac [Fluoxetine] Other (See Comments)    Increased Depression and Suicidal thoughts   Tape Other (See Comments)    Skin tears, can only tolerate paper tape.   Tylenol [Acetaminophen] Rash and Other (See Comments)    Rash on face      Total Time Spent in Direct Patient Care:  I personally spent 70 minutes on the unit in direct patient care. The direct patient care time included face-to-face time with the patient, reviewing the patient's chart, communicating with other professionals, and coordinating  care for discharge, ensuring safe transfer. Greater than 50% of this time was spent in counseling or coordinating care with the patient regarding goals of hospitalization, psycho-education, and discharge planning needs.

## 2022-10-19 NOTE — Discharge Summary (Signed)
[facility]  Physician Discharge Summary   Patient ID: [name] MRN: [id number] DOB/AGE: 1990/09/25 32 y.o.  Admit date: 08/22/2022 Discharge date: 10/19/2022    PCP: [facility]  DISCHARGE DIAGNOSES:    Suicide attempt (HCC)   Asthma   GAD (generalized anxiety disorder)   Bipolar I disorder, current or most recent episode depressed, with psychotic features (HCC)   Overdose   Drug overdose Sinus tachycardia   Patient discharged/transferred to behavioral health   INITIAL HISTORY: 32 year old with past medical history significant for adjustment disorder with mixed anxiety and depressed mood, Autism, bipolar 1 disorder, fragile X syndrome, seizure not on meds, schizoaffective disorder and multiple suicidal attempts in the last year presented to the ED after ingesting numerous medications in a suicide attempt.She reported taking #30 metoprolol 25mg  tablets, #60 Hydroxyzine 25mg , #7 Lexapro 20mg , #21 Gabapentin 600mg , #7 Flexeril 10mg , #7 protonix 40mg  and #7 Prazosin 1mg .  She was bradycardic and hypotensive on EMS arrival which improved with fluids.  Poison control was contacted as she was within 2 hours of injection and was given activated charcoal.  Patient was placed under IVC in the ED.  Poison control also recommended ICU admission for close monitoring of QTc and electrolytes possible pressors.  Patient was intubated and started on IV pressors and received glucagon.  She was a started also on ceftriaxone for pneumonia.  She was subsequently extubated 7/18 and transferred to the floor 7/19.  She has been under [facility] service since 08/25/2022.  Psychiatry continue to follow.  Did recommend inpatient psychiatric hospitalization.  Awaiting placement at [facility].     HOSPITAL COURSE:   SI attempt with intentional overdose: -She presented after having taken multiple medications including: # 30 metoprolol 25mg  tablets, # 60 Hydroxyzine 25mg , # 7 Lexapro 20mg , # 21  Gabapentin 600mg , # 7 Flexeril 10mg , # 7 protonix 40mg  and # 7 Prazosin 1mg .  -Poison control was contacted.  Patient received charcoal in the ED.  She initially required pressors, glucagon, intubation mechanical ventilation. She was involuntarily committed. Seen by psychiatry who is managing.  Subsequently discharged to behavioral health.   Anxiety and Depression, Schizoaffective Disorder, Adjustment Disorder, bipolar disorder, fibromyalgia and auditory hallucination:    Community-acquired pneumonia: Completed antibiotic   Essential hypertension: Continue metoprolol and prazosin.  Due to elevated blood pressure patient was given extra dose of Metoprolol on 9/11. Dose can be increased to 25mg  twice daily if BP remains elevated.    Sinus tachycardia: Specially during episodes of anxiety. D-dimer, troponin, TSH normal limits. Seems to be better.   Metabolic  acidosis: Resolved Started weaning sodium bicarb,.    Hypomagnesemia: Replaced Deficiency anemia: Continue with iron supplement History of seizure disorder: Not on medications Asthma.  Nebulizer PRN Hypoalbuminemia encourage oral intake   Obesity Estimated body mass index is 36.03 kg/m as calculated from the following:   Height as of this encounter: 5\' 5"  (1.651 m).   Weight as of this encounter: 98.2 kg.  Patient discharged/transferred to behavioral health   PERTINENT LABS:  The results of significant diagnostics from this hospitalization (including imaging, microbiology, ancillary and laboratory) are listed below for reference.      Allergies as of 10/19/2022       Reactions   Bee Venom Anaphylaxis   Coconut Flavor Anaphylaxis, Rash   Fish Allergy Anaphylaxis   Geodon [ziprasidone Hcl] Other (See Comments)   Pt states that this medication causes paralysis of the mouth.     Haloperidol And Related Other (  See Comments)   Pt states that this medication causes paralysis of the mouth, jaw locks up   Lithobid [lithium]  Other (See Comments)   Seizure-like activity   Roxicodone [oxycodone] Other (See Comments)   Hallucinations    Seroquel [quetiapine] Other (See Comments)   Severe drowsiness - Pt currently taking 12.5 mg BID, she is ok with this dose   Shellfish Allergy Anaphylaxis   Phenergan [promethazine Hcl] Other (See Comments)   Chest pain    Prilosec [omeprazole] Nausea And Vomiting, Other (See Comments)   Pt can take protonix with no problems   Sulfa Antibiotics Other (See Comments)   Chest pain    Tegretol [carbamazepine] Nausea And Vomiting   Prozac [fluoxetine] Other (See Comments)   Increased Depression and Suicidal thoughts   Tape Other (See Comments)   Skin tears, can only tolerate paper tape.   Tylenol [acetaminophen] Rash, Other (See Comments)   Rash on face         Medication List     STOP taking these medications    benztropine 1 MG tablet Commonly known as: COGENTIN   escitalopram 20 MG tablet Commonly known as: LEXAPRO   fluticasone-salmeterol 250-50 MCG/ACT Aepb Commonly known as: ADVAIR       TAKE these medications    albuterol 108 (90 Base) MCG/ACT inhaler Commonly known as: VENTOLIN HFA Inhale 2 puffs into the lungs every 6 (six) hours as needed for wheezing or shortness of breath.   ARIPiprazole 2 MG tablet Commonly known as: ABILIFY Take 1 tablet (2 mg total) by mouth daily.   cholecalciferol 10 MCG (400 UNIT) Tabs tablet Commonly known as: VITAMIN D3 Take 1 tablet (400 Units total) by mouth daily.   cyclobenzaprine 10 MG tablet Commonly known as: FLEXERIL Take 10 mg by mouth at bedtime.   diclofenac Sodium 1 % Gel Commonly known as: VOLTAREN Apply 2 g topically 4 (four) times daily. What changed: when to take this   DULoxetine 60 MG capsule Commonly known as: CYMBALTA Take 1 capsule (60 mg total) by mouth daily.   ferrous sulfate 325 (65 FE) MG tablet Take 1 tablet (325 mg total) by mouth 2 (two) times daily with a meal.   gabapentin  600 MG tablet Commonly known as: NEURONTIN Take 600 mg by mouth 3 (three) times daily.   hydrOXYzine 25 MG tablet Commonly known as: ATARAX Take 1 tablet (25 mg total) by mouth 3 (three) times daily as needed for anxiety.   ibuprofen 600 MG tablet Commonly known as: ADVIL Take 1 tablet (600 mg total) by mouth 3 (three) times daily as needed for mild pain or moderate pain.   LORazepam 1 MG tablet Commonly known as: ATIVAN Take 1 tablet (1 mg total) by mouth 2 (two) times daily as needed for anxiety (agitation).   magnesium oxide 400 (240 Mg) MG tablet Commonly known as: MAG-OX Take 1 tablet (400 mg total) by mouth 2 (two) times daily.   metoprolol tartrate 25 MG tablet Commonly known as: LOPRESSOR Take 0.5 tablets (12.5 mg total) by mouth 2 (two) times daily.   mometasone-formoterol 200-5 MCG/ACT Aero Commonly known as: DULERA Inhale 2 puffs into the lungs 2 (two) times daily.   OLANZapine 15 MG tablet Commonly known as: ZYPREXA Take 1 tablet (15 mg total) by mouth every evening. What changed:  medication strength how much to take when to take this   OLANZapine zydis 5 MG disintegrating tablet Commonly known as: ZYPREXA Take 1 tablet (5 mg  total) by mouth 2 (two) times daily before lunch and supper. What changed: You were already taking a medication with the same name, and this prescription was added. Make sure you understand how and when to take each.   ondansetron 4 MG disintegrating tablet Commonly known as: ZOFRAN-ODT Take 1 tablet (4 mg total) by mouth every 8 (eight) hours as needed for nausea or vomiting.   pantoprazole 40 MG tablet Commonly known as: PROTONIX Take 40 mg by mouth daily before breakfast.   polyethylene glycol 17 g packet Commonly known as: MIRALAX / GLYCOLAX Take 17 g by mouth 2 (two) times daily as needed for mild constipation.   prazosin 1 MG capsule Commonly known as: MINIPRESS Take 1 mg by mouth at bedtime.   senna-docusate 8.6-50 MG  tablet Commonly known as: Senokot-S Take 1 tablet by mouth 2 (two) times daily as needed for moderate constipation.   sodium bicarbonate 650 MG tablet Take 1 tablet (650 mg total) by mouth daily.           TOTAL DISCHARGE TIME: 35 mins  Zackariah Vanderpol [employer] on [email or web address]  10/20/2022, 10:37 AM
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
## 2022-10-20 NOTE — Progress Notes (Signed)
   10/20/22 0553  15 Minute Checks  Location Bedroom  Visual Appearance Calm  Behavior Sleeping  Sleep (Behavioral Health Patients Only)  Calculate sleep? (Click Yes once per 24 hr at 0600 safety check) Yes  Documented sleep last 24 hours 7.75

## 2022-10-20 NOTE — Plan of Care (Signed)
  Problem: Education: Goal: Emotional status will improve Outcome: Progressing   

## 2022-10-20 NOTE — Progress Notes (Signed)
   10/20/22 0730  Psych Admission Type (Psych Patients Only)  Admission Status Involuntary  Psychosocial Assessment  Patient Complaints None  Eye Contact Fair  Facial Expression Flat  Affect Flat;Anxious  Speech Logical/coherent  Interaction Assertive  Motor Activity Slow  Appearance/Hygiene Improved  Behavior Characteristics Cooperative  Mood Anxious  Thought Process  Coherency WDL  Content WDL  Delusions None reported or observed  Perception WDL  Hallucination None reported or observed  Judgment Poor  Confusion None  Danger to Self  Current suicidal ideation? Denies  Self-Injurious Behavior No self-injurious ideation or behavior indicators observed or expressed   Agreement Not to Harm Self Yes  Description of Agreement verbal  Danger to Others Abnormal  Harmful Behavior to others No threats or harm toward other people  Destructive Behavior No threats or harm toward property

## 2022-10-20 NOTE — [facility] Group Notes (Signed)
[facility] Group Notes:  (Nursing/MHT/Case Management/Adjunct)  Date:  10/20/2022  Time:  2000  Type of Therapy:   Wrap up group  Participation Level:  Active  Participation Quality:  Appropriate, Attentive, Sharing, and Supportive  Affect:  Anxious  Cognitive:  Alert  Insight:  Lacking  Engagement in Group:  Engaged  Modes of Intervention:  Clarification, Education, and Support  Summary of Progress/Problems: Positive thinking and positive change were discussed.   Marcille Buffy 10/20/2022, 9:12 PM

## 2022-10-20 NOTE — Progress Notes (Signed)
   10/20/22 2227  Psych Admission Type (Psych Patients Only)  Admission Status Involuntary  Psychosocial Assessment  Patient Complaints None  Eye Contact Brief  Facial Expression Flat  Affect Anxious  Speech Logical/coherent  Interaction Assertive  Motor Activity Restless  Appearance/Hygiene Improved  Behavior Characteristics Cooperative  Mood Pleasant;Anxious  Thought Process  Coherency WDL  Content WDL  Delusions None reported or observed  Perception WDL  Hallucination None reported or observed  Judgment Poor  Confusion None  Danger to Self  Current suicidal ideation? Denies  Self-Injurious Behavior No self-injurious ideation or behavior indicators observed or expressed   Agreement Not to Harm Self Yes  Description of Agreement verbal  Danger to Others Abnormal  Harmful Behavior to others No threats or harm toward other people  Destructive Behavior No threats or harm toward property

## 2022-10-20 NOTE — Progress Notes (Signed)
Patient refused 1800 zyprexa po stating she takes 5mg  at 12pm, 5mg  at 1700, and 15mg  at bedtime. Patient stated her medication is spaced out so that she is able to function at work. NP notified.
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
## 2022-10-20 NOTE — H&P (Signed)
Psychiatric Admission Assessment Adult  Patient Identification: [name] MRN:  [id number] Date of Evaluation:  10/20/2022 Chief Complaint:  Major depressive disorder, recurrent, severe with psychotic features (HCC) [F33.3] Principal Diagnosis: Major depressive disorder, recurrent, severe with psychotic features (HCC) Diagnosis:  Principal Problem:   Major depressive disorder, recurrent, severe with psychotic features (HCC)  History of Present Illness: [name] is a 32 y.o. female with a past history significant for schizoaffective-bipolar type, GAD, PTSD, carrier of fragile x syndrome, ASD v asperger syndrome, borderline personality, NSSIB (cutting), fibromyalgia, multiple suicide attempt, multiple inpt psych admission, who initially presented to [facility] ED 08/22/22 via EMS after ingesting multiple medications (#30 metoprolol 25mg  tablets, #60 Hydroxyzine 25mg , #7 Lexapro 20mg , #21 Gabapentin 600mg , #7 Flexeril 10mg , #7 protonix 40mg  and #7 Prazosin 1mg ) in a suicide attempt. She was placed under IVC and admitted to the ICU where she was intubated; later extubated 08/23/22 then transferred to medical unit where she remained until 10/19/22 when she was transferred to [facility] (26th admission).   24 Hour Chart Review: Patient arrived on unit where staff report guest has been cooperative on the unit taking medications, intermittently attending groups and unit activities. She has remained in her room majority of the morning. Slept 7.75 hours overnight.   Assessment: Patient assessed in her room where she presents alert and oriented. Fleeting eye contact, often looking away when speaking. Mood euthymic, flat affect. She reports feeling "good. Glad to be out of [employer]. I was there 10 weeks. They finally got my meds right. I'm ready to go home now". She reports losing her job 08/20/22 and overdosing on pills stating she had recently switched pharmacist who filled a 30 day prescription  instead of her typical 7 days worth providing her with access to more medications than normal and took them out of 'impulse'. She insists wanting to live and having plans for the future. She currently rates her mood as "good", anxiety 3-4/10 and depression 0/10. She is reporting her daughter as a motivating factor stating she "needs to live" for her as the father completed suicide a few years ago. She reports plan to return home to the apartment she shares with her ex-boyfriend who she identifies as a support and person who will manage her medications upon discharge. She is denying any active SI/HI/AVH and is contracting for safety stating "nothing is worth taking your life over. I've learned my lesson". She is inquiring about discharge timeline stating she feels ready. No changes to medications at this time and plan to continue regimen as noted below.   Associated Signs/Symptoms: Depression Symptoms:  weight gain, (Hypo) Manic Symptoms:   denies any at this time Anxiety Symptoms:   anxiousness Psychotic Symptoms:   denies any PTSD Symptoms: Had a traumatic exposure:  recent significant suicide attempt via overdose, childhood trauma, ex-husband completed suicide Total Time spent with patient: 45 minutes  Past Psychiatric History: Major Depressive Disorder recurrent severe with psychotic features, Borderline Personality Disorder, Asperger Syndrome, Schizoaffective Disorder bipolar type, PTSD, self-injurious behavior, multiple (>20) suicide attempts, non-compliance, multiple inpatient psychiatric hospitalizations, high ED utilization.  Adjustment disorder with mixed anxiety and depressed mood (01/31/2022), Adjustment disorder with mixed disturbance of emotions and conduct (08/03/2019), Anxiety, Asthma, Autism, Bipolar 1 disorder, depressed, severe (HCC) (07/25/2021), Carrier of fragile X syndrome, Chronic constipation, Depression, Drug-seeking behavior, Essential tremor, Headache, Ineffective individual  coping (05/16/2022), Insomnia (01/12/2022), Intentional drug overdose (HCC) (06/05/2022), Neuromuscular disorder (HCC), Normocytic anemia (06/05/2022), Overdose (07/22/2017), Overdose of acetaminophen (07/2017),  Overdose, intentional self-harm, initial encounter (HCC) (07/20/2021), Paranoia (HCC) (04/22/2021), Personality disorder (HCC), Purposeful non-suicidal drug ingestion (HCC) (06/27/2021), Schizo-affective psychosis (HCC), Schizoaffective disorder (HCC) (07/29/2022), Schizoaffective disorder, bipolar type (HCC), Seizures (HCC), Skin erythema (04/27/2022), Sleep apnea, Suicidal behavior (07/25/2021), Suicidal ideation, Suicide (HCC) (07/01/2021), and Suicide attempt (HCC) (07/04/2021).   Social History:  Childhood (bring, raised, lives now, parents, siblings, schooling, education): Patient was born and raised in [place], [place], patient has 2 brothers, patient has 3 associate degrees.  Abuse: Sexual, Physical, Emotional, Verbal  Marital Status: widow Sexual orientation: heterosexual  Children:1 daughter  Employment: unemployed Peer Group: [facility]ST Housing: Patient rents an apartment with a friend Finances: Patient receives disability  Legal: denies Hotel manager: denies   Substance Use History: Alcohol: denies Tobacco: denies Illicit drugs: denies Rx drug abuse: denies Rehab hx: denies   Is the patient at risk to self? Yes.    Has the patient been a risk to self in the past 6 months? Yes.    Has the patient been a risk to self within the distant past? Yes.    Is the patient a risk to others? No.  Has the patient been a risk to others in the past 6 months? No.  Has the patient been a risk to others within the distant past? No.   [place] Scale:  Flowsheet Row Admission (Current) [facility] Hosp-Admission (Discharged) from 08/22/2022 in [facility] ED from 08/20/2022 in [facility]  C-SSRS RISK CATEGORY High Risk High Risk High Risk        Prior Inpatient Therapy: Yes.   If yes, describe: >20 inpatient admission, >50 ED encounters in 2024; most recently discharge from [facility] 08/02/22 Prior Outpatient Therapy: Yes.   If yes, describe: [facility]. Recently dismissed from CST Team.    Alcohol Screening: 1. How often do you have a drink containing alcohol?: Never 2. How many drinks containing alcohol do you have on a typical day when you are drinking?: 1 or 2 3. How often do you have six or more drinks on one occasion?: Never AUDIT-C Score: 0 4. How often during the last year have you found that you were not able to stop drinking once you had started?: Never 5. How often during the last year have you failed to do what was normally expected from you because of drinking?: Never 6. How often during the last year have you needed a first drink in the morning to get yourself going after a heavy drinking session?: Never 7. How often during the last year have you had a feeling of guilt of remorse after drinking?: Never 8. How often during the last year have you been unable to remember what happened the night before because you had been drinking?: Never 9. Have you or someone else been injured as a result of your drinking?: No 10. Has a relative or friend or a doctor or another health worker been concerned about your drinking or suggested you cut down?: No Alcohol Use Disorder Identification Test Final Score (AUDIT): 0 Substance Abuse History in the last 12 months:  No. Consequences of Substance Abuse: NA Previous Psychotropic Medications: Yes  Psychological Evaluations: Yes  Past Medical History:  Past Medical History:  Diagnosis Date   Acid reflux    Adjustment disorder with mixed anxiety and depressed mood 01/31/2022   Adjustment disorder with mixed disturbance of emotions and conduct 08/03/2019  Anxiety    Asthma    last attack 03/13/15 or 03/14/15   Autism     Bipolar 1 disorder, depressed, severe (HCC) 07/25/2021   Carrier of fragile X syndrome    Chronic constipation    Depression    Drug-seeking behavior    Essential tremor    Headache    Ineffective individual coping 05/16/2022   Insomnia 01/12/2022   Intentional drug overdose (HCC) 06/05/2022   Neuromuscular disorder (HCC)    Normocytic anemia 06/05/2022   Overdose 07/22/2017   Overdose of acetaminophen 07/2017   and other meds   Overdose, intentional self-harm, initial encounter (HCC) 07/20/2021   Paranoia (HCC) 04/22/2021   Personality disorder (HCC)    Purposeful non-suicidal drug ingestion (HCC) 06/27/2021   Schizo-affective psychosis (HCC)    Schizoaffective disorder (HCC) 07/29/2022   Schizoaffective disorder, bipolar type (HCC)    Seizures (HCC)    Last seizure December 2017   Skin erythema 04/27/2022   Sleep apnea    Suicidal behavior 07/25/2021   Suicidal ideation    Suicide (HCC) 07/01/2021   Suicide attempt (HCC) 07/04/2021    Past Surgical History:  Procedure Laterality Date   MOUTH SURGERY  2009 or 2010   Family History:  Family History  Problem Relation Age of Onset   Mental illness Father    Asthma Father    PDD Brother    Seizures Brother    Family Psychiatric  History:  Psych: Father has PTSD, Mother and Brother has Autism, Brother has depression  Psych Rx: SA/HA: denies  Substance use family hx: denies   Tobacco Screening:  Social History   Tobacco Use  Smoking Status Former   Types: Cigarettes  Smokeless Tobacco Never  Tobacco Comments   Smoked for 2  years age [age]    BH Tobacco Counseling     Are you interested in Tobacco Cessation Medications?  N/A, patient does not use tobacco products Counseled patient on smoking cessation:  N/A, patient does not use tobacco products Reason Tobacco Screening Not Completed: No value filed.       Social History:  Social History   Substance and Sexual Activity  Alcohol Use No    Alcohol/week: 1.0 standard drink of alcohol   Types: 1 Standard drinks or equivalent per week   Comment: denies at this time     Social History   Substance and Sexual Activity  Drug Use No   Comment: History of cocaine use at age 21 for 4 months    Additional Social History: Marital status: Widowed Widowed, when?: 2018. Long term relationship, how long?: None. What types of issues is patient dealing with in the relationship?: None What is your sexual orientation?: "straight" Does patient have children?: Yes How many children?: 1 How is patient's relationship with their children?: Pt reports that her daughter lives with her deceased husband's mother-everything is good.    Allergies:   Allergies  Allergen Reactions   Bee Venom Anaphylaxis   Coconut Flavor Anaphylaxis and Rash   Fish Allergy Anaphylaxis   Geodon [Ziprasidone Hcl] Other (See Comments)    Pt states that this medication causes paralysis of the mouth.     Haloperidol And Related Other (See Comments)    Pt states that this medication causes paralysis of the mouth, jaw locks up   Lithobid [Lithium] Other (See Comments)    Seizure-like activity    Roxicodone [Oxycodone] Other (See Comments)    Hallucinations    Seroquel [Quetiapine] Other (See  Comments)    Severe drowsiness - Pt currently taking 12.5 mg BID, she is ok with this dose   Shellfish Allergy Anaphylaxis   Phenergan [Promethazine Hcl] Other (See Comments)    Chest pain     Prilosec [Omeprazole] Nausea And Vomiting and Other (See Comments)    Pt can take protonix with no problems    Sulfa Antibiotics Other (See Comments)    Chest pain    Tegretol [Carbamazepine] Nausea And Vomiting   Prozac [Fluoxetine] Other (See Comments)    Increased Depression and Suicidal thoughts   Tape Other (See Comments)    Skin tears, can only tolerate paper tape.   Tylenol [Acetaminophen] Rash and Other (See Comments)    Rash on face    Lab Results: No results found  for this or any previous visit (from the past 48 hour(s)).  Blood Alcohol level:  Lab Results  Component Value Date   ETH <10 08/22/2022   ETH <10 08/09/2022    Metabolic Disorder Labs:  Lab Results  Component Value Date   HGBA1C 5.2 04/20/2022   MPG 103 04/20/2022   MPG 96.8 10/07/2021   Lab Results  Component Value Date   PROLACTIN 66.2 (H) 03/02/2021   PROLACTIN 66.1 (H) 02/04/2020   Lab Results  Component Value Date   CHOL 191 04/20/2022   TRIG 112 08/23/2022   HDL 52 04/20/2022   CHOLHDL 3.7 04/20/2022   VLDL 33 04/20/2022   LDLCALC 106 (H) 04/20/2022   LDLCALC 102 (H) 12/10/2021    Current Medications: Current Facility-Administered Medications  Medication Dose Route Frequency Provider Last Rate Last Admin   albuterol (VENTOLIN HFA) 108 (90 Base) MCG/ACT inhaler 2 puff  2 puff Inhalation Q6H PRN Mariel Craft, MD       alum & mag hydroxide-simeth (MAALOX/MYLANTA) 200-200-20 MG/5ML suspension 30 mL  30 mL Oral Q4H PRN Mariel Craft, MD       ARIPiprazole (ABILIFY) tablet 2 mg  2 mg Oral Daily Mariel Craft, MD   2 mg at 10/20/22 0730   cholecalciferol (VITAMIN D3) 10 MCG (400 UNIT) tablet 400 Units  400 Units Oral Daily Mariel Craft, MD   400 Units at 10/20/22 0730   cyclobenzaprine (FLEXERIL) tablet 10 mg  10 mg Oral QHS Mariel Craft, MD   10 mg at 10/19/22 2111   diclofenac Sodium (VOLTAREN) 1 % topical gel 2 g  2 g Topical BID PRN Mariel Craft, MD   2 g at 10/20/22 0730   DULoxetine (CYMBALTA) DR capsule 60 mg  60 mg Oral Daily Mariel Craft, MD   60 mg at 10/20/22 0730   ferrous sulfate tablet 325 mg  325 mg Oral BID WC Mariel Craft, MD   325 mg at 10/20/22 0730   gabapentin (NEURONTIN) capsule 600 mg  600 mg Oral TID Mariel Craft, MD   600 mg at 10/20/22 0730   hydrOXYzine (ATARAX) tablet 25 mg  25 mg Oral TID PRN Mariel Craft, MD   25 mg at 10/19/22 2111   ibuprofen (ADVIL) tablet 600 mg  600 mg Oral TID PRN Mariel Craft,  MD   600 mg at 10/20/22 0618   magnesium oxide (MAG-OX) tablet 400 mg  400 mg Oral BID Mariel Craft, MD   400 mg at 10/20/22 0730   metoprolol tartrate (LOPRESSOR) tablet 12.5 mg  12.5 mg Oral BID Mariel Craft, MD   12.5 mg at 10/20/22  0732   mometasone-formoterol (DULERA) 200-5 MCG/ACT inhaler 2 puff  2 puff Inhalation BID Mariel Craft, MD   2 puff at 10/20/22 0730   OLANZapine (ZYPREXA) tablet 5 mg  5 mg Oral BID PRN Starleen Blue, NP       Or   OLANZapine (ZYPREXA) injection 5 mg  5 mg Intramuscular BID PRN Starleen Blue, NP       OLANZapine (ZYPREXA) tablet 15 mg  15 mg Oral QPM Mariel Craft, MD   15 mg at 10/19/22 2001   OLANZapine zydis (ZYPREXA) disintegrating tablet 5 mg  5 mg Oral BID AC Mariel Craft, MD   5 mg at 10/19/22 1617   ondansetron (ZOFRAN-ODT) disintegrating tablet 4 mg  4 mg Oral Q8H PRN Mariel Craft, MD       pantoprazole (PROTONIX) EC tablet 40 mg  40 mg Oral QAC breakfast Mariel Craft, MD   40 mg at 10/20/22 0618   polyethylene glycol (MIRALAX / GLYCOLAX) packet 17 g  17 g Oral BID PRN Mariel Craft, MD   17 g at 10/20/22 0750   prazosin (MINIPRESS) capsule 1 mg  1 mg Oral QHS Mariel Craft, MD   1 mg at 10/19/22 2111   senna-docusate (Senokot-S) tablet 1 tablet  1 tablet Oral BID PRN Mariel Craft, MD   1 tablet at 10/20/22 0750   sodium bicarbonate tablet 650 mg  650 mg Oral Daily Mariel Craft, MD   650 mg at 10/20/22 0732   PTA Medications: Medications Prior to Admission  Medication Sig Dispense Refill Last Dose   albuterol (VENTOLIN HFA) 108 (90 Base) MCG/ACT inhaler Inhale 2 puffs into the lungs every 6 (six) hours as needed for wheezing or shortness of breath.      ARIPiprazole (ABILIFY) 2 MG tablet Take 1 tablet (2 mg total) by mouth daily.      cholecalciferol (VITAMIN D3) 10 MCG (400 UNIT) TABS tablet Take 1 tablet (400 Units total) by mouth daily.      cyclobenzaprine (FLEXERIL) 10 MG tablet Take 10 mg by mouth at  bedtime.      diclofenac Sodium (VOLTAREN) 1 % GEL Apply 2 g topically 4 (four) times daily. (Patient taking differently: Apply 2 g topically 4 (four) times daily as needed.)      DULoxetine (CYMBALTA) 60 MG capsule Take 1 capsule (60 mg total) by mouth daily.      ferrous sulfate 325 (65 FE) MG tablet Take 1 tablet (325 mg total) by mouth 2 (two) times daily with a meal.      gabapentin (NEURONTIN) 600 MG tablet Take 600 mg by mouth 3 (three) times daily.      hydrOXYzine (ATARAX) 25 MG tablet Take 1 tablet (25 mg total) by mouth 3 (three) times daily as needed for anxiety. 30 tablet 0    ibuprofen (ADVIL) 600 MG tablet Take 1 tablet (600 mg total) by mouth 3 (three) times daily as needed for mild pain or moderate pain.      LORazepam (ATIVAN) 1 MG tablet Take 1 tablet (1 mg total) by mouth 2 (two) times daily as needed for anxiety (agitation).      magnesium oxide (MAG-OX) 400 (240 Mg) MG tablet Take 1 tablet (400 mg total) by mouth 2 (two) times daily.      metoprolol tartrate (LOPRESSOR) 25 MG tablet Take 0.5 tablets (12.5 mg total) by mouth 2 (two) times daily.      mometasone-formoterol (  DULERA) 200-5 MCG/ACT AERO Inhale 2 puffs into the lungs 2 (two) times daily.      OLANZapine (ZYPREXA) 15 MG tablet Take 1 tablet (15 mg total) by mouth every evening. (Patient taking differently: Take 15 mg by mouth every evening. Takes at 8p)      OLANZapine zydis (ZYPREXA) 5 MG disintegrating tablet Take 1 tablet (5 mg total) by mouth 2 (two) times daily before lunch and supper.      ondansetron (ZOFRAN-ODT) 4 MG disintegrating tablet Take 1 tablet (4 mg total) by mouth every 8 (eight) hours as needed for nausea or vomiting.      pantoprazole (PROTONIX) 40 MG tablet Take 40 mg by mouth daily before breakfast.      polyethylene glycol (MIRALAX / GLYCOLAX) 17 g packet Take 17 g by mouth 2 (two) times daily as needed for mild constipation.      prazosin (MINIPRESS) 1 MG capsule Take 1 mg by mouth at bedtime.       senna-docusate (SENOKOT-S) 8.6-50 MG tablet Take 1 tablet by mouth 2 (two) times daily as needed for moderate constipation. (Patient taking differently: Take 1 tablet by mouth daily.)      sodium bicarbonate 650 MG tablet Take 1 tablet (650 mg total) by mouth daily.       Musculoskeletal: Strength & Muscle Tone: within normal limits Gait & Station: normal Patient leans: N/A  Psychiatric Specialty Exam:  Presentation  General Appearance:  Appropriate for Environment; Casual  Eye Contact: Fleeting  Speech: Clear and Coherent  Speech Volume: Normal  Handedness: Right   Mood and Affect  Mood: Dysphoric  Affect: Congruent   Thought Process  Thought Processes: Linear  Duration of Psychotic Symptoms: years Past Diagnosis of Schizophrenia or Psychoactive disorder: Yes  Descriptions of Associations:Intact  Orientation:Full (Time, Place and Person)  Thought Content:Perseveration  Hallucinations:Hallucinations: None  Ideas of Reference:None  Suicidal Thoughts:Suicidal Thoughts: No  Homicidal Thoughts:Homicidal Thoughts: No   Sensorium  Memory: Immediate Fair; Recent Fair; Remote Fair  Judgment: Poor  Insight: Shallow   Executive Functions  Concentration: Fair  Attention Span: Fair  Recall: [employer] of Knowledge: Fair  Language: Fair   Psychomotor Activity  Psychomotor Activity: Psychomotor Activity: Normal   Assets  Assets: Resilience; Desire for Improvement   Sleep  Sleep: Sleep: Fair    Physical Exam: Physical Exam Vitals and nursing note reviewed.  Constitutional:      Appearance: She is obese.  HENT:     Head: Normocephalic.     Nose: Nose normal.     Mouth/Throat:     Mouth: Mucous membranes are moist.     Pharynx: Oropharynx is clear.  Cardiovascular:     Rate and Rhythm: Normal rate.     Pulses: Normal pulses.  Pulmonary:     Effort: Pulmonary effort is normal.  Abdominal:     Palpations: Abdomen  is soft.  Musculoskeletal:        General: Normal range of motion.  Neurological:     Mental Status: She is alert and oriented to person, place, and time.  Psychiatric:        Attention and Perception: Attention and perception normal.        Mood and Affect: Affect is blunt.        Speech: Speech normal.        Behavior: Behavior is withdrawn. Behavior is cooperative.        Thought Content: Thought content is not paranoid or delusional. Thought  content does not include homicidal or suicidal ideation. Thought content does not include homicidal or suicidal plan.        Cognition and Memory: Cognition and memory normal.        Judgment: Judgment normal. Judgment is not impulsive or inappropriate.    Review of Systems  Psychiatric/Behavioral:  Negative for depression, hallucinations, memory loss, substance abuse and suicidal ideas. The patient is not nervous/anxious and does not have insomnia.    Blood pressure 129/73, pulse (!) 127, temperature 98.5 F (36.9 C), resp. rate 19, height 5\' 6"  (1.676 m), weight 107 kg, last menstrual period 08/08/2022, SpO2 99%. Body mass index is 38.09 kg/m.  Treatment Plan Summary: Daily contact with patient to assess and evaluate symptoms and progress in treatment, Medication management, and Plan   Medications: Continue:  Schizoaffective Disorder, Bipolar Type: Abilify 2 mg PO daily  Olanzapine 5 mg BID before lunch and supper Olanzapine 15 mg PO every evening  Major Depressive Disorder, recurrent severe with psychotic features: Cymbalta DR 60 mg PO daily  PTSD: Prazosin 1 mg daily HS   Vitamin D Deficiency Vitamin D3 400 units PO daily  Fibromyalgia:  Cyclobenzapine 10 mg PO daily HS Gabapentin 600 mg TID   Anemia:  Ferrous Sulfate 325 mg PO BID with meals  HypoMagnesia:  Magnesium Oxide 400 mg PO BID   Hypertension:  Metoprolol Tartrate 12.5 mg PO BID  Asthma:  Dulera 200-5 mcg/act inhaler 2 puff BID  GERD:  Protonix EC 40 mg  PO daily before breakfast Sodium Bicarbonate 650 mg PO daily  Other PRNs: Albuterol 108 mcg/act inhaler 2 puff q6 hrs PRN wheezing, SOB Maalox/Mylanta 30 mL PO q4 hours PRN indigestion Diclofenac Sodium 1% TOP 2g BID daily PRN pain Hydroxyzine 25 mg PO TID anxiety Ibuprofen 600 mg PO TID PRN mild - moderate pain Olanzapine 5 mg IM/PO BID PRN agitation Ondansetron ODT 4 mg PO q8hrs PRN nausea, vomiting Miralax 17 g PO BID PRN mild constipation Senokot-S 1 Tab PO BID PRN moderate constipation  Observation Level/Precautions:  15 minute checks  Laboratory:  CBC Chemistry Profile HbAIC HCG UDS UA  Psychotherapy:  group  Medications:   see MAR  Consultations:   social work  Discharge Concerns:  SAFETY  Estimated LOS: 5-7 days  Other:     Physician Treatment Plan for Primary Diagnosis: Major depressive disorder, recurrent, severe with psychotic features (HCC) Long Term Goal(s): Improvement in symptoms so as ready for discharge  Short Term Goals: Ability to identify changes in lifestyle to reduce recurrence of condition will improve, Ability to verbalize feelings will improve, Ability to disclose and discuss suicidal ideas, Ability to demonstrate self-control will improve, Ability to identify and develop effective coping behaviors will improve, Ability to maintain clinical measurements within normal limits will improve, Compliance with prescribed medications will improve, and Ability to identify triggers associated with substance abuse/mental health issues will improve  Physician Treatment Plan for Secondary Diagnosis: Principal Problem:   Major depressive disorder, recurrent, severe with psychotic features (HCC)  Long Term Goal(s): Improvement in symptoms so as ready for discharge  Short Term Goals: Ability to identify changes in lifestyle to reduce recurrence of condition will improve, Ability to verbalize feelings will improve, Ability to disclose and discuss suicidal ideas, Ability  to demonstrate self-control will improve, Ability to identify and develop effective coping behaviors will improve, Ability to maintain clinical measurements within normal limits will improve, Compliance with prescribed medications will improve, and Ability to identify triggers associated with  substance abuse/mental health issues will improve  I certify that inpatient services furnished can reasonably be expected to improve the patient's condition.    Loletta Parish, NP 9/14/202411:19 AM

## 2022-10-21 MED ORDER — OLANZAPINE 7.5 MG PO TABS
15.0000 mg | ORAL_TABLET | ORAL | Status: DC
  Administered 2022-10-21 – 2022-10-30 (×10): 15 mg via ORAL
  Filled 2022-10-21 (×13): qty 2

## 2022-10-21 MED ORDER — DICLOFENAC SODIUM 1 % EX GEL
2.0000 g | Freq: Four times a day (QID) | CUTANEOUS | Status: DC
  Administered 2022-10-21 – 2022-10-31 (×35): 2 g via TOPICAL
  Filled 2022-10-21 (×2): qty 100

## 2022-10-21 NOTE — Progress Notes (Signed)
   10/21/22 0558  15 Minute Checks  Location Bedroom  Visual Appearance Calm  Behavior Sleeping  Sleep (Behavioral Health Patients Only)  Calculate sleep? (Click Yes once per 24 hr at 0600 safety check) Yes  Documented sleep last 24 hours 8.25

## 2022-10-21 NOTE — Plan of Care (Signed)
  Problem: Activity: Goal: Ability to tolerate increased activity will improve Outcome: Progressing   Problem: Education: Goal: Emotional status will improve Outcome: Progressing   Problem: Education: Goal: Mental status will improve Outcome: Progressing   Problem: Activity: Goal: Interest or engagement in activities will improve Outcome: Progressing   Problem: Activity: Goal: Sleeping patterns will improve Outcome: Progressing   Problem: Safety: Goal: Periods of time without injury will increase Outcome: Progressing

## 2022-10-21 NOTE — Group Note (Signed)
Date:  10/21/2022 Time:  2:47 PM  Group Topic/Focus:  Goals Group:   The focus of this group is to help patients establish daily goals to achieve during treatment and discuss how the patient can incorporate goal setting into their daily lives to aide in recovery.    Participation Level:  Active  Participation Quality:  Appropriate  Affect:  Appropriate  Cognitive:  Appropriate  Insight: Lacking  Engagement in Group:  Developing/Improving  Modes of Intervention:  Discussion  Additional Comments:     Reymundo Poll 10/21/2022, 2:47 PM

## 2022-10-21 NOTE — Plan of Care (Signed)
Problem: Education: Goal: Emotional status will improve Outcome: Progressing

## 2022-10-21 NOTE — Progress Notes (Signed)
   10/21/22 2049  Psych Admission Type (Psych Patients Only)  Admission Status Involuntary  Psychosocial Assessment  Patient Complaints Anxiety  Eye Contact Brief  Facial Expression Flat  Affect Anxious  Speech Logical/coherent  Interaction Assertive  Motor Activity Restless  Appearance/Hygiene Improved  Behavior Characteristics Cooperative;Appropriate to situation  Mood Anxious  Thought Process  Coherency WDL  Content WDL  Delusions None reported or observed  Perception WDL  Hallucination None reported or observed  Judgment Poor  Confusion None  Danger to Self  Current suicidal ideation? Denies  Self-Injurious Behavior No self-injurious ideation or behavior indicators observed or expressed   Agreement Not to Harm Self Yes  Description of Agreement verbal  Danger to Others Abnormal  Harmful Behavior to others No threats or harm toward other people  Destructive Behavior No threats or harm toward property

## 2022-10-21 NOTE — Progress Notes (Signed)
Patient rated her anxiety level 4/10 and her depression level 0/10 with 10 being the highest and 0 none. Pt stated her goal for today as," Discuss discharge with the doctor". Pt denied SI/HI and AVH. Medication and group compliant. Pt observed interacting well with some peers. HR- 126 this AM, recheck=106. EKG done as ordered and copy placed on chart. Pt unable to give urine for testing at this time. Safety maintained.  10/21/22 0805  Psych Admission Type (Psych Patients Only)  Admission Status Involuntary  Psychosocial Assessment  Patient Complaints Anxiety  Eye Contact Brief  Facial Expression Flat  Affect Anxious  Speech Logical/coherent  Interaction Assertive  Motor Activity Restless  Appearance/Hygiene Unremarkable  Behavior Characteristics Cooperative  Mood Anxious  Thought Process  Coherency WDL  Content WDL  Delusions None reported or observed  Perception WDL  Hallucination None reported or observed  Judgment Poor  Confusion None  Danger to Self  Current suicidal ideation? Denies  Agreement Not to Harm Self Yes  Description of Agreement Verbal  Danger to Others Abnormal  Harmful Behavior to others No threats or harm toward other people  Destructive Behavior No threats or harm toward property

## 2022-10-21 NOTE — Progress Notes (Signed)
Urine collected as ordered.

## 2022-10-21 NOTE — Progress Notes (Addendum)
Psychiatric Admission Assessment Adult  Patient Identification: Tifa Bengtson MRN:  [id number] Date of Evaluation:  10/21/2022 Chief Complaint:  Major depressive disorder, recurrent, severe with psychotic features (HCC) [F33.3] Principal Diagnosis: Major depressive disorder, recurrent, severe with psychotic features (HCC) Diagnosis:  Principal Problem:   Major depressive disorder, recurrent, severe with psychotic features (HCC)  History of Present Illness: [name] is a 32 y.o. female with a past history significant for schizoaffective-bipolar type, GAD, PTSD, carrier of fragile x syndrome, ASD v asperger syndrome, borderline personality, NSSIB (cutting), fibromyalgia, multiple suicide attempt, multiple inpt psych admission, who initially presented to [facility] ED 08/22/22 via EMS after ingesting multiple medications (#30 metoprolol 25mg  tablets, #60 Hydroxyzine 25mg , #7 Lexapro 20mg , #21 Gabapentin 600mg , #7 Flexeril 10mg , #7 protonix 40mg  and #7 Prazosin 1mg ) in a suicide attempt. She was placed under IVC and admitted to the ICU where she was intubated; later extubated 08/23/22 then transferred to medical unit where she remained until 10/19/22 when she was transferred to [facility] (26th admission).   24 Hour Chart Review:  Sleep Hours last night: reports that sleep quality was good.  Nursing Concerns: None  Behavioral episodes in the past 24 hrs:WNL Medication Compliance: compliant  Vital Signs in the past 24 hrs: Tachycardia since admission-126 earlier today morning. PRN Medications in the past 24 hrs:   Patient assessment note:  On assessment today, the pt reports that their mood has improved significantly since admission.  She reports that mood started improving while at the medical Hospital, since she spent a significant amount of time on the MedSurg unit prior to transportation to this hospital for stabilization of her mental status.  Reports that anxiety is currently under  control. Sleep is good, reports a good sleep quality last night. Appetite is within normal limits. Concentration is moderate. Energy level is moderate. Denies suicidal thoughts.  Denies suicidal intent and plan.  Denies having any HI.  Denies having psychotic symptoms.   Denies having side effects to current psychiatric medications.  Reports that she is tolerating medications well, able to verbalize goals for the future, states that she regrets most recent suicide attempt, states "I am done taking overdoses, this is not healthy for me.  I have been locked up for 10 weeks, I am ready to go.  I wanted to medical billing and coding, and do something with myself.  But I need to find me another CST team."  Patient is at a chronic risk for suicide due to impulsivity. We discussed changes to current medication regimen, including making changes to her evening dose of Zyprexa 15 mg; changing it to 8 PM instead of 6 PM.  Increase in the interval hours for her Voltaren to 4 times daily instead of twice daily.  Continuing other medications as listed below.  Patient reports that her current medication regimen is helpful, in improving her mood, and also lessening anxiety symptoms.   Discussed the following psychosocial stressors: Lack of current community assertive team.  Patient talked about her CST team terminating her as the patient, and verbalized wanting them to reassume caring for her.  Educated on the fact that CSW will have to reach out to the CST team tomorrow to find out if they can take her back.  She talked about having 30 days supply of her medications, which she will used for the last overdose due to an error with her previous pharmacy.  She alleges that the family is a transferred her to a mail only  option, which led to a refill of 30 days meds being sent directly to her instead of a 7 day at a time as previously done. We discussed tentative discharge dates of either 9/17 or 9/18 since we will have to  make sure that pt has community support to sustain her needs outside of this [facility].  We also talked about the option of getting her connected to a partial hospitalization program prior to discharge, in the event that has CST is unable to accept her back as a patient.  We will discuss this in treatment team tomorrow morning.  Labs reviewed: Orders placed for lipid panel, TSH, hemoglobin A1c, vitamin D levels.  Repeat EKG ordered in the context of tachycardia, given that the last one was on 8/29.  Urine pregnancy test also ordered, since the last 1 was on 07/15. Ordered repeat vital signs to trend heart rate.  Associated Signs/Symptoms: Depression Symptoms:  weight gain, (Hypo) Manic Symptoms:   denies any at this time Anxiety Symptoms:   anxiousness Psychotic Symptoms:   denies any PTSD Symptoms: Had a traumatic exposure:  recent significant suicide attempt via overdose, childhood trauma, ex-husband completed suicide Total Time spent with patient: 45 minutes  Past Psychiatric History: Major Depressive Disorder recurrent severe with psychotic features, Borderline Personality Disorder, Asperger Syndrome, Schizoaffective Disorder bipolar type, PTSD, self-injurious behavior, multiple (>20) suicide attempts, non-compliance, multiple inpatient psychiatric hospitalizations, high ED utilization.  Adjustment disorder with mixed anxiety and depressed mood (01/31/2022), Adjustment disorder with mixed disturbance of emotions and conduct (08/03/2019), Anxiety, Asthma, Autism, Bipolar 1 disorder, depressed, severe (HCC) (07/25/2021), Carrier of fragile X syndrome, Chronic constipation, Depression, Drug-seeking behavior, Essential tremor, Headache, Ineffective individual coping (05/16/2022), Insomnia (01/12/2022), Intentional drug overdose (HCC) (06/05/2022), Neuromuscular disorder (HCC), Normocytic anemia (06/05/2022), Overdose (07/22/2017), Overdose of acetaminophen (07/2017), Overdose, intentional self-harm, initial  encounter (HCC) (07/20/2021), Paranoia (HCC) (04/22/2021), Personality disorder (HCC), Purposeful non-suicidal drug ingestion (HCC) (06/27/2021), Schizo-affective psychosis (HCC), Schizoaffective disorder (HCC) (07/29/2022), Schizoaffective disorder, bipolar type (HCC), Seizures (HCC), Skin erythema (04/27/2022), Sleep apnea, Suicidal behavior (07/25/2021), Suicidal ideation, Suicide (HCC) (07/01/2021), and Suicide attempt (HCC) (07/04/2021).   Social History:  Childhood (bring, raised, lives now, parents, siblings, schooling, education): Patient was born and raised in [place], [place], patient has 2 brothers, patient has 3 associate degrees.  Abuse: Sexual, Physical, Emotional, Verbal  Marital Status: widow Sexual orientation: heterosexual  Children:1 daughter  Employment: unemployed Peer Group: CST Housing: Patient rents an apartment with a friend Finances: Patient receives disability  Legal: denies Hotel manager: denies   Substance Use History: Alcohol: denies Tobacco: denies Illicit drugs: denies Rx drug abuse: denies Rehab hx: denies   Is the patient at risk to self? Yes.    Has the patient been a risk to self in the past 6 months? Yes.    Has the patient been a risk to self within the distant past? Yes.    Is the patient a risk to others? No.  Has the patient been a risk to others in the past 6 months? No.  Has the patient been a risk to others within the distant past? No.   [place] Scale:  Flowsheet Row [facility] (Current) [facility] Hosp-Admission (Discharged) from 08/22/2022 in [facility] ED from 08/20/2022 in [facility]  C-SSRS RISK CATEGORY High Risk High Risk High Risk        Prior Inpatient Therapy: Yes.   If yes,  describe: >20 inpatient admission, >50 ED encounters in 2024; most recently discharge from [facility] 08/02/22 Prior Outpatient Therapy: Yes.    If yes, describe: [facility]. Recently dismissed from CST Team.    Alcohol Screening: 1. How often do you have a drink containing alcohol?: Never 2. How many drinks containing alcohol do you have on a typical day when you are drinking?: 1 or 2 3. How often do you have six or more drinks on one occasion?: Never AUDIT-C Score: 0 4. How often during the last year have you found that you were not able to stop drinking once you had started?: Never 5. How often during the last year have you failed to do what was normally expected from you because of drinking?: Never 6. How often during the last year have you needed a first drink in the morning to get yourself going after a heavy drinking session?: Never 7. How often during the last year have you had a feeling of guilt of remorse after drinking?: Never 8. How often during the last year have you been unable to remember what happened the night before because you had been drinking?: Never 9. Have you or someone else been injured as a result of your drinking?: No 10. Has a relative or friend or a doctor or another health worker been concerned about your drinking or suggested you cut down?: No Alcohol Use Disorder Identification Test Final Score (AUDIT): 0 Substance Abuse History in the last 12 months:  No. Consequences of Substance Abuse: NA Previous Psychotropic Medications: Yes  Psychological Evaluations: Yes  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    Chronic constipation    Depression    Drug-seeking behavior    Essential tremor    Headache    Ineffective individual coping 05/16/2022   Insomnia 01/12/2022    Intentional drug overdose (HCC) 06/05/2022   Neuromuscular disorder (HCC)    Normocytic anemia 06/05/2022   Overdose 07/22/2017   Overdose of acetaminophen 07/2017   and other meds   Overdose, intentional self-harm, initial encounter (HCC) 07/20/2021   Paranoia (HCC) 04/22/2021   Personality disorder (HCC)    Purposeful non-suicidal drug ingestion (HCC) 06/27/2021   Schizo-affective psychosis (HCC)    Schizoaffective disorder (HCC) 07/29/2022   Schizoaffective disorder, bipolar type (HCC)    Seizures (HCC)    Last seizure December 2017   Skin erythema 04/27/2022   Sleep apnea    Suicidal behavior 07/25/2021   Suicidal ideation    Suicide (HCC) 07/01/2021   Suicide attempt (HCC) 07/04/2021    Past Surgical History:  Procedure Laterality Date   MOUTH SURGERY  2009 or 2010   Family History:  Family History  Problem Relation Age of Onset   Mental illness Father    Asthma Father    PDD Brother    Seizures Brother    Family Psychiatric  History:  Psych: Father has PTSD, Mother and Brother has Autism, Brother has depression  Psych Rx: SA/HA: denies  Substance use family hx: denies   Tobacco Screening:  Social History   Tobacco Use  Smoking Status Former  Types: Cigarettes  Smokeless Tobacco Never  Tobacco Comments   Smoked for 2  years age [age]    BH Tobacco Counseling     Are you interested in Tobacco Cessation Medications?  N/A, patient does not use tobacco products Counseled patient on smoking cessation:  N/A, patient does not use tobacco products Reason Tobacco Screening Not Completed: No value filed.       Social History:  Social History   Substance and Sexual Activity  Alcohol Use No   Alcohol/week: 1.0 standard drink of alcohol   Types: 1 Standard drinks or equivalent per week   Comment: denies at this time     Social History   Substance and Sexual Activity  Drug Use No   Comment: History of cocaine use at age 39 for 4 months    Additional  Social History: Marital status: Widowed Widowed, when?: 2018. Long term relationship, how long?: None. What types of issues is patient dealing with in the relationship?: None What is your sexual orientation?: "straight" Does patient have children?: Yes How many children?: 1 How is patient's relationship with their children?: Pt reports that her daughter lives with her deceased husband's mother-everything is good.    Allergies:   Allergies  Allergen Reactions   Bee Venom Anaphylaxis   Coconut Flavor Anaphylaxis and Rash   Fish Allergy Anaphylaxis   Geodon [Ziprasidone Hcl] Other (See Comments)    Pt states that this medication causes paralysis of the mouth.     Haloperidol And Related Other (See Comments)    Pt states that this medication causes paralysis of the mouth, jaw locks up   Lithobid [Lithium] Other (See Comments)    Seizure-like activity    Roxicodone [Oxycodone] Other (See Comments)    Hallucinations    Seroquel [Quetiapine] Other (See Comments)    Severe drowsiness - Pt currently taking 12.5 mg BID, she is ok with this dose   Shellfish Allergy Anaphylaxis   Phenergan [Promethazine Hcl] Other (See Comments)    Chest pain     Prilosec [Omeprazole] Nausea And Vomiting and Other (See Comments)    Pt can take protonix with no problems    Sulfa Antibiotics Other (See Comments)    Chest pain    Tegretol [Carbamazepine] Nausea And Vomiting   Prozac [Fluoxetine] Other (See Comments)    Increased Depression and Suicidal thoughts   Tape Other (See Comments)    Skin tears, can only tolerate paper tape.   Tylenol [Acetaminophen] Rash and Other (See Comments)    Rash on face    Lab Results: No results found for this or any previous visit (from the past 48 hour(s)).  Blood Alcohol level:  Lab Results  Component Value Date   ETH <10 08/22/2022   ETH <10 08/09/2022    Metabolic Disorder Labs:  Lab Results  Component Value Date   HGBA1C 5.2 04/20/2022   MPG 103  04/20/2022   MPG 96.8 10/07/2021   Lab Results  Component Value Date   PROLACTIN 66.2 (H) 03/02/2021   PROLACTIN 66.1 (H) 02/04/2020   Lab Results  Component Value Date   CHOL 191 04/20/2022   TRIG 112 08/23/2022   HDL 52 04/20/2022   CHOLHDL 3.7 04/20/2022   VLDL 33 04/20/2022   LDLCALC 106 (H) 04/20/2022   LDLCALC 102 (H) 12/10/2021    Current Medications: Current Facility-Administered Medications  Medication Dose Route Frequency Provider Last Rate Last Admin   albuterol (VENTOLIN HFA) 108 (90 Base) MCG/ACT inhaler 2  puff  2 puff Inhalation Q6H PRN Mariel Craft, MD       alum & mag hydroxide-simeth (MAALOX/MYLANTA) 200-200-20 MG/5ML suspension 30 mL  30 mL Oral Q4H PRN Mariel Craft, MD       ARIPiprazole (ABILIFY) tablet 2 mg  2 mg Oral Daily Mariel Craft, MD   2 mg at 10/21/22 0747   cholecalciferol (VITAMIN D3) 10 MCG (400 UNIT) tablet 400 Units  400 Units Oral Daily Mariel Craft, MD   400 Units at 10/21/22 0747   cyclobenzaprine (FLEXERIL) tablet 10 mg  10 mg Oral QHS Mariel Craft, MD   10 mg at 10/20/22 2107   diclofenac Sodium (VOLTAREN) 1 % topical gel 2 g  2 g Topical QID Starleen Blue, NP       DULoxetine (CYMBALTA) DR capsule 60 mg  60 mg Oral Daily Mariel Craft, MD   60 mg at 10/21/22 0747   ferrous sulfate tablet 325 mg  325 mg Oral BID WC Mariel Craft, MD   325 mg at 10/21/22 0747   gabapentin (NEURONTIN) capsule 600 mg  600 mg Oral TID Mariel Craft, MD   600 mg at 10/21/22 1120   hydrOXYzine (ATARAX) tablet 25 mg  25 mg Oral TID PRN Mariel Craft, MD   25 mg at 10/21/22 1149   ibuprofen (ADVIL) tablet 600 mg  600 mg Oral TID PRN Mariel Craft, MD   600 mg at 10/20/22 2024   magnesium oxide (MAG-OX) tablet 400 mg  400 mg Oral BID Mariel Craft, MD   400 mg at 10/21/22 0747   metoprolol tartrate (LOPRESSOR) tablet 12.5 mg  12.5 mg Oral BID Mariel Craft, MD   12.5 mg at 10/21/22 0747   mometasone-formoterol (DULERA) 200-5  MCG/ACT inhaler 2 puff  2 puff Inhalation BID Mariel Craft, MD   2 puff at 10/21/22 0746   OLANZapine (ZYPREXA) tablet 5 mg  5 mg Oral BID PRN Starleen Blue, NP       Or   OLANZapine (ZYPREXA) injection 5 mg  5 mg Intramuscular BID PRN Starleen Blue, NP       OLANZapine (ZYPREXA) tablet 15 mg  15 mg Oral Q24H Hill, Shelbie Hutching, MD       OLANZapine zydis (ZYPREXA) disintegrating tablet 5 mg  5 mg Oral BID AC Mariel Craft, MD   5 mg at 10/21/22 1120   ondansetron (ZOFRAN-ODT) disintegrating tablet 4 mg  4 mg Oral Q8H PRN Mariel Craft, MD       pantoprazole (PROTONIX) EC tablet 40 mg  40 mg Oral QAC breakfast Mariel Craft, MD   40 mg at 10/21/22 1610   polyethylene glycol (MIRALAX / GLYCOLAX) packet 17 g  17 g Oral BID PRN Mariel Craft, MD   17 g at 10/21/22 0748   prazosin (MINIPRESS) capsule 1 mg  1 mg Oral QHS Mariel Craft, MD   1 mg at 10/20/22 2107   senna-docusate (Senokot-S) tablet 1 tablet  1 tablet Oral BID PRN Mariel Craft, MD   1 tablet at 10/21/22 0747   sodium bicarbonate tablet 650 mg  650 mg Oral Daily Mariel Craft, MD   650 mg at 10/21/22 0747   PTA Medications: Medications Prior to Admission  Medication Sig Dispense Refill Last Dose   albuterol (VENTOLIN HFA) 108 (90 Base) MCG/ACT inhaler Inhale 2 puffs into the lungs every 6 (six) hours as  needed for wheezing or shortness of breath.      ARIPiprazole (ABILIFY) 2 MG tablet Take 1 tablet (2 mg total) by mouth daily.      cholecalciferol (VITAMIN D3) 10 MCG (400 UNIT) TABS tablet Take 1 tablet (400 Units total) by mouth daily.      cyclobenzaprine (FLEXERIL) 10 MG tablet Take 10 mg by mouth at bedtime.      diclofenac Sodium (VOLTAREN) 1 % GEL Apply 2 g topically 4 (four) times daily. (Patient taking differently: Apply 2 g topically 4 (four) times daily as needed.)      DULoxetine (CYMBALTA) 60 MG capsule Take 1 capsule (60 mg total) by mouth daily.      ferrous sulfate 325 (65 FE) MG tablet  Take 1 tablet (325 mg total) by mouth 2 (two) times daily with a meal.      gabapentin (NEURONTIN) 600 MG tablet Take 600 mg by mouth 3 (three) times daily.      hydrOXYzine (ATARAX) 25 MG tablet Take 1 tablet (25 mg total) by mouth 3 (three) times daily as needed for anxiety. 30 tablet 0    ibuprofen (ADVIL) 600 MG tablet Take 1 tablet (600 mg total) by mouth 3 (three) times daily as needed for mild pain or moderate pain.      LORazepam (ATIVAN) 1 MG tablet Take 1 tablet (1 mg total) by mouth 2 (two) times daily as needed for anxiety (agitation).      magnesium oxide (MAG-OX) 400 (240 Mg) MG tablet Take 1 tablet (400 mg total) by mouth 2 (two) times daily.      metoprolol tartrate (LOPRESSOR) 25 MG tablet Take 0.5 tablets (12.5 mg total) by mouth 2 (two) times daily.      mometasone-formoterol (DULERA) 200-5 MCG/ACT AERO Inhale 2 puffs into the lungs 2 (two) times daily.      OLANZapine (ZYPREXA) 15 MG tablet Take 1 tablet (15 mg total) by mouth every evening. (Patient taking differently: Take 15 mg by mouth every evening. Takes at 8p)      OLANZapine zydis (ZYPREXA) 5 MG disintegrating tablet Take 1 tablet (5 mg total) by mouth 2 (two) times daily before lunch and supper.      ondansetron (ZOFRAN-ODT) 4 MG disintegrating tablet Take 1 tablet (4 mg total) by mouth every 8 (eight) hours as needed for nausea or vomiting.      pantoprazole (PROTONIX) 40 MG tablet Take 40 mg by mouth daily before breakfast.      polyethylene glycol (MIRALAX / GLYCOLAX) 17 g packet Take 17 g by mouth 2 (two) times daily as needed for mild constipation.      prazosin (MINIPRESS) 1 MG capsule Take 1 mg by mouth at bedtime.      senna-docusate (SENOKOT-S) 8.6-50 MG tablet Take 1 tablet by mouth 2 (two) times daily as needed for moderate constipation. (Patient taking differently: Take 1 tablet by mouth daily.)      sodium bicarbonate 650 MG tablet Take 1 tablet (650 mg total) by mouth daily.        Musculoskeletal: Strength & Muscle Tone: within normal limits Gait & Station: normal Patient leans: N/A  Psychiatric Specialty Exam:  Presentation  General Appearance:  Appropriate for Environment; Fairly Groomed  Eye Contact: Fair  Speech: Clear and Coherent  Speech Volume: Normal  Handedness: Right   Mood and Affect  Mood: Anxious  Affect: Congruent   Thought Process  Thought Processes: Coherent  Duration of Psychotic Symptoms: years Past Diagnosis  of Schizophrenia or Psychoactive disorder: Yes  Descriptions of Associations:Intact  Orientation:Full (Time, Place and Person)  Thought Content:Logical  Hallucinations:Hallucinations: None  Ideas of Reference:None  Suicidal Thoughts:Suicidal Thoughts: No  Homicidal Thoughts:Homicidal Thoughts: No   Sensorium  Memory: Immediate Good  Judgment: Fair  Insight: Fair   Art therapist  Concentration: Fair  Attention Span: Fair  Recall: Fair  Fund of Knowledge: Fair  Language: Fair   Psychomotor Activity  Psychomotor Activity: Psychomotor Activity: Normal   Assets  Assets: Communication Skills; Resilience; Housing   Sleep  Sleep: Sleep: Good    Physical Exam: Physical Exam Vitals and nursing note reviewed.  Constitutional:      Appearance: She is obese.  HENT:     Head: Normocephalic.     Nose: Nose normal.     Mouth/Throat:     Mouth: Mucous membranes are moist.     Pharynx: Oropharynx is clear.  Cardiovascular:     Rate and Rhythm: Normal rate.     Pulses: Normal pulses.  Pulmonary:     Effort: Pulmonary effort is normal.  Abdominal:     Palpations: Abdomen is soft.  Musculoskeletal:        General: Normal range of motion.  Neurological:     Mental Status: She is alert and oriented to person, place, and time.  Psychiatric:        Attention and Perception: Attention and perception normal.        Mood and Affect: Affect is blunt.        Speech:  Speech normal.        Behavior: Behavior is withdrawn. Behavior is cooperative.        Thought Content: Thought content is not paranoid or delusional. Thought content does not include homicidal or suicidal ideation. Thought content does not include homicidal or suicidal plan.        Cognition and Memory: Cognition and memory normal.        Judgment: Judgment normal. Judgment is not impulsive or inappropriate.    Review of Systems  Constitutional: Negative.   HENT: Negative.    Eyes: Negative.   Respiratory: Negative.    Cardiovascular:  Positive for palpitations.  Gastrointestinal: Negative.   Genitourinary: Negative.   Musculoskeletal:  Positive for myalgias.  Skin: Negative.   Neurological: Negative.   Psychiatric/Behavioral:  Positive for depression. Negative for hallucinations, memory loss, substance abuse and suicidal ideas. The patient is nervous/anxious and has insomnia.    Blood pressure 122/84, pulse (!) 126, temperature 97.6 F (36.4 C), temperature source Oral, resp. rate 19, height 5\' 6"  (1.676 m), weight 107 kg, last menstrual period 08/08/2022, SpO2 98%. Body mass index is 38.09 kg/m.  Treatment Plan Summary: Daily contact with patient to assess and evaluate symptoms and progress in treatment, Medication management, and Plan    Safety and Monitoring: Voluntary admission to inpatient psychiatric unit for safety, stabilization and treatment Daily contact with patient to assess and evaluate symptoms and progress in treatment Patient's case to be discussed in multi-disciplinary team meeting Observation Level : q15 minute checks Vital signs: q12 hours Precautions: Safety                    Medications: Schizoaffective Disorder, Bipolar Type: Abilify 2 mg PO daily  Olanzapine 5 mg BID before lunch and supper Olanzapine 15 mg PO every night Cymbalta DR 60 mg PO daily  PTSD: Prazosin 1 mg daily HS   Vitamin D Deficiency Vitamin D3 400 units PO  daily  Fibromyalgia:  Cyclobenzapine 10 mg PO daily HS Gabapentin 600 mg TID   Anemia:  Ferrous Sulfate 325 mg PO BID with meals  HypoMagnesia:  Magnesium Oxide 400 mg PO BID   Hypertension:  Metoprolol Tartrate 12.5 mg PO BID  Asthma:  Dulera 200-5 mcg/act inhaler 2 puff BID  GERD:  Protonix EC 40 mg PO daily before breakfast Sodium Bicarbonate 650 mg PO daily  OA Diclofenac Sodium 1% TOP 2g QID pain   PRNs: Albuterol 108 mcg/act inhaler 2 puff q6 hrs PRN wheezing, SOB Maalox/Mylanta 30 mL PO q4 hours PRN indigestion Hydroxyzine 25 mg PO TID anxiety Ibuprofen 600 mg PO TID PRN mild - moderate pain Olanzapine 5 mg IM/PO BID PRN agitation Ondansetron ODT 4 mg PO q8hrs PRN nausea, vomiting Miralax 17 g PO BID PRN mild constipation Senokot-S 1 Tab PO BID PRN moderate constipation  Long Term Goal(s): Improvement in symptoms so as ready for discharge  Short Term Goals: Ability to verbalize feelings will improve, Ability to disclose and discuss suicidal ideas, Ability to demonstrate self-control will improve, Ability to identify and develop effective coping behaviors will improve, and Compliance with prescribed medications will improve  Discharge Planning: Social work and case management to assist with discharge planning and identification of hospital follow-up needs prior to discharge Estimated LOS: 5-7 days Discharge Concerns: Need to establish a safety plan; Medication compliance and effectiveness Discharge Goals: Return home with outpatient referrals for mental health follow-up including medication management/psychotherapy  I certify that inpatient services furnished can reasonably be expected to improve the patient's condition.    Starleen Blue, [place] 9/15/20241:32 PM Patient ID: [name], female   DOB: 1990/04/25, 32 y.o.   MRN: [id number]

## 2022-10-21 NOTE — [facility] Group Notes (Signed)
[facility] Group Notes:  (Nursing/MHT/Case Management/Adjunct)  Date:  10/21/2022  Time: 2000  Type of Therapy:   Wrap up group  Participation Level:  Active  Participation Quality:  Appropriate, Attentive, Sharing, and Supportive  Affect:  Flat and Irritable  Cognitive:  Alert  Insight:  Limited  Engagement in Group:  Engaged  Modes of Intervention:  Clarification, Education, and Socialization  Summary of Progress/Problems: Positive thinking and self-care were discussed.   [name] 10/21/2022, 9:26 PM

## 2022-10-21 NOTE — Group Note (Signed)
[facility] LCSW Group Therapy Note  Date/Time:  10/21/2022 10:00am-11:00am  Type of Therapy and Topic:  Group Therapy:  Healthy and Unhealthy Coping Skills and Supports  Participation Level:  Active   Description of Group:  Patients in this group explored the differences between healthy and unhealthy, specifically with coping skills and supports, using the worksheet below.  Many examples were provided by CSW and group members.  The emphasis was on providing hope for change.  A demonstration was given about how to set boundaries with family and/or friends, which patients expressed was beneficial.    Therapeutic Goals:   1)  compare healthy versus unhealthy coping skills and healthy versus unhealthy supports 2)  identify examples of each and demonstrate commonalities within the group  3)  generate ideas of healthy coping skills and supports that can be added  4)  discuss importance of adding healthy supports and setting boundaries with unhealthy supports  5)  offer mutual support about how to address unhealthy coping skills and supports  6)  encourage active participation in group   Healthy vs. 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  Fast Usually takes time to develop  Easy Often difficult to learn  Often effortless, can be done without thought Usually takes effort, thinking about it  Usually a habit Usually unknown, has to become a habit  Can do alone Often need to reach out for help   Leads to loss Leads to gain         My Unhealthy Coping Skills                                    My Healthy Coping Skills                       My Unhealthy Supports                                           My Healthy Supports                       Summary of Patient Progress:  The patient expressed herself multiple times  during group, showing that she was listening and was invested in the topic.  Her responses were on topic and appropriate.   Therapeutic Modalities:   Psychoeducation Brief Solution-Focused Therapy  Ambrose Mantle, LCSW

## 2022-10-22 ENCOUNTER — Ambulatory Visit (HOSPITAL_COMMUNITY)
Admit: 2022-10-22 | Discharge: 2022-10-22 | Disposition: A | Discharge status: Home / Self Care | Payer: Medicare PPO | Attending: Emergency Medicine | Admitting: Emergency Medicine

## 2022-10-22 ENCOUNTER — Encounter (HOSPITAL_COMMUNITY): Payer: Self-pay | Admitting: Psychiatry

## 2022-10-22 DIAGNOSIS — R52 Pain, unspecified: Secondary | ICD-10-CM

## 2022-10-22 DIAGNOSIS — F333 Major depressive disorder, recurrent, severe with psychotic symptoms: Secondary | ICD-10-CM

## 2022-10-22 LAB — LIPID PANEL
Cholesterol: 192 mg/dL (ref 0–200)
HDL: 46 mg/dL (ref >40)
LDL Cholesterol: 117 mg/dL — ABNORMAL HIGH (ref 0–99)
Total CHOL/HDL Ratio: 4.2 ratio
Triglycerides: 143 mg/dL (ref <150)
VLDL: 29 mg/dL (ref 0–40)

## 2022-10-22 LAB — VITAMIN D 25 HYDROXY (VIT D DEFICIENCY, FRACTURES): Vit D, 25-Hydroxy: 31.39 ng/mL (ref 30–100)

## 2022-10-22 LAB — TSH: TSH: 2.85 u[IU]/mL (ref 0.350–4.500)

## 2022-10-22 LAB — PREGNANCY, URINE: Preg Test, Ur: NEGATIVE

## 2022-10-22 MED ORDER — NAPROXEN 500 MG PO TABS
500.0000 mg | ORAL_TABLET | Freq: Once | ORAL | Status: AC
  Administered 2022-10-22: 500 mg via ORAL
  Filled 2022-10-22: qty 1

## 2022-10-22 NOTE — Discharge Instructions (Addendum)
-  Follow-up with your outpatient psychiatric provider -instructions on appointment date, time, and address (location) are provided to you in discharge paperwork.  -Take your psychiatric medications as prescribed at discharge - instructions are provided to you in the discharge paperwork Your Rx are being sent to [facility], and your act team will pickup and provide to you in 7 day increments.    -Follow-up with outpatient primary care doctor and other specialists -for management of preventative medicine and any chronic medical disease.  -Recommend abstinence from alcohol, tobacco, and other illicit drug use at discharge.   -If your psychiatric symptoms recur, worsen, or if you have side effects to your psychiatric medications, call your outpatient psychiatric provider, 911, [phone] or go to the nearest emergency department.  -If suicidal thoughts occur, call your outpatient psychiatric provider, 911, [phone] or go to the nearest emergency department.  Naloxone (Narcan) can help reverse an overdose when given to the victim quickly.  [facility] offers free naloxone kits and instructions/training on its use.  Add naloxone to your first aid kit and you can help save a life.   Pick up your free kit at the following locations:   [place]:  [facility], [address] [place] [address] [phone]) [facility] [address] [place] [address] [phone]) [facility] [address] [place] [address]  [place]: [facility] [address] [place] [address] ([phone]) [facility] [address] [place] [address] [phone])   Mei was seen in the ER for right calf pain.  Her DVT study was negative. Her pain seems more localized to the muscles and soft tissues surrounding the medial knee. I recommend  continuing NSAIDs, rest, ice or warm compresses, and elevating the affected extremity.  She may need to use a wheelchair to rest the leg. Recommend following up with orthopedics if pain persists despite conservative treatment.   She is medically cleared for return to [facility].

## 2022-10-22 NOTE — ED Provider Notes (Signed)
[place] EMERGENCY DEPARTMENT AT [facility] Provider Note   CSN: [id number] Arrival date & time: 10/22/22  1207     History  Chief Complaint  Patient presents with   Leg Pain    Sharlyn Ekern is a 32 y.o. female with history of depression, schizoaffective disorder, autism, essential tremor, seizures, asthma, no muscular disorder who presents the emergency department from behavioral health hospital for evaluation of right calf pain.  Per behavioral health note, patient was complaining of right calf pain with associated warmth.  Patient had recently been hospitalized for 2 months after an overdose, and is currently staying at the behavioral health hospital.  Providers were concerned with her history of prolonged hospitalizations, DVT, and medications that predispose her to VTE, she was sent to the ER for evaluation of possible DVT in the right calf.    Leg Pain      Home Medications Prior to Admission medications   Medication Sig Start Date End Date Taking? Authorizing Provider  albuterol (VENTOLIN HFA) 108 (90 Base) MCG/ACT inhaler Inhale 2 puffs into the lungs every 6 (six) hours as needed for wheezing or shortness of breath.   Yes [provider]  ARIPiprazole (ABILIFY) 2 MG tablet Take 1 tablet (2 mg total) by mouth daily. 10/20/22  Yes Osvaldo Shipper, MD  cholecalciferol (VITAMIN D3) 10 MCG (400 UNIT) TABS tablet Take 1 tablet (400 Units total) by mouth daily. 10/20/22  Yes Osvaldo Shipper, MD  cyclobenzaprine (FLEXERIL) 10 MG tablet Take 10 mg by mouth at bedtime. 05/10/22  Yes [provider]  diclofenac Sodium (VOLTAREN) 1 % GEL Apply 2 g topically 4 (four) times daily. Patient taking differently: Apply 2 g topically 4 (four) times daily as needed. 10/19/22  Yes Osvaldo Shipper, MD  DULoxetine (CYMBALTA) 60 MG capsule Take 1 capsule (60 mg total) by mouth daily. 10/20/22  Yes Osvaldo Shipper, MD  ferrous sulfate 325 (65 FE) MG tablet Take 1  tablet (325 mg total) by mouth 2 (two) times daily with a meal. 10/19/22  Yes Osvaldo Shipper, MD  gabapentin (NEURONTIN) 600 MG tablet Take 600 mg by mouth 3 (three) times daily. 06/29/22  Yes [provider]  hydrOXYzine (ATARAX) 25 MG tablet Take 1 tablet (25 mg total) by mouth 3 (three) times daily as needed for anxiety. 06/11/22  Yes Starleen Blue, NP  ibuprofen (ADVIL) 600 MG tablet Take 1 tablet (600 mg total) by mouth 3 (three) times daily as needed for mild pain or moderate pain. 10/19/22  Yes Osvaldo Shipper, MD  LORazepam (ATIVAN) 1 MG tablet Take 1 tablet (1 mg total) by mouth 2 (two) times daily as needed for anxiety (agitation). 10/19/22  Yes Osvaldo Shipper, MD  magnesium oxide (MAG-OX) 400 (240 Mg) MG tablet Take 1 tablet (400 mg total) by mouth 2 (two) times daily. 10/19/22  Yes Osvaldo Shipper, MD  metoprolol tartrate (LOPRESSOR) 25 MG tablet Take 0.5 tablets (12.5 mg total) by mouth 2 (two) times daily. 10/19/22  Yes Osvaldo Shipper, MD  mometasone-formoterol [facility]) 200-5 MCG/ACT AERO Inhale 2 puffs into the lungs 2 (two) times daily. 10/19/22  Yes Osvaldo Shipper, MD  OLANZapine (ZYPREXA) 15 MG tablet Take 1 tablet (15 mg total) by mouth every evening. Patient taking differently: Take 15 mg by mouth every evening. Takes at 8p 10/19/22  Yes Osvaldo Shipper, MD  OLANZapine zydis (ZYPREXA) 5 MG disintegrating tablet Take 1 tablet (5 mg total) by mouth 2 (two) times daily before lunch and supper. 10/19/22  Yes Osvaldo Shipper, MD  ondansetron (ZOFRAN-ODT) 4 MG disintegrating tablet Take 1 tablet (4 mg total) by mouth every 8 (eight) hours as needed for nausea or vomiting. 10/19/22  Yes Osvaldo Shipper, MD  pantoprazole (PROTONIX) 40 MG tablet Take 40 mg by mouth daily before breakfast.   Yes [provider]  polyethylene glycol (MIRALAX / GLYCOLAX) 17 g packet Take 17 g by mouth 2 (two) times daily as needed for mild constipation. 10/19/22  Yes Osvaldo Shipper, MD  prazosin  (MINIPRESS) 1 MG capsule Take 1 mg by mouth at bedtime.   Yes [provider]  senna-docusate (SENOKOT-S) 8.6-50 MG tablet Take 1 tablet by mouth 2 (two) times daily as needed for moderate constipation. Patient taking differently: Take 1 tablet by mouth daily. 10/19/22  Yes Osvaldo Shipper, MD  sodium bicarbonate 650 MG tablet Take 1 tablet (650 mg total) by mouth daily. 10/20/22  Yes Osvaldo Shipper, MD      Allergies    Bee venom, Coconut flavor, Fish allergy, Geodon [ziprasidone hcl], Haloperidol and related, Lithobid [lithium], Roxicodone [oxycodone], Seroquel [quetiapine], Shellfish allergy, Phenergan [promethazine hcl], Prilosec [omeprazole], Sulfa antibiotics, Tegretol [carbamazepine], Prozac [fluoxetine], Tape, and Tylenol [acetaminophen]    Review of Systems   Review of Systems  Musculoskeletal:  Positive for myalgias.  All other systems reviewed and are negative.   Physical Exam Updated Vital Signs BP (!) 136/90 (BP Location: Left Arm)   Pulse 96   Temp 98.2 F (36.8 C) (Oral)   Resp 18   Ht 5\' 6"  (1.676 m)   Wt 107 kg   LMP 08/08/2022 (Approximate)   SpO2 100%   BMI 38.09 kg/m  Physical Exam Vitals and nursing note reviewed.  Constitutional:      Appearance: Normal appearance.  HENT:     Head: Normocephalic and atraumatic.  Eyes:     Conjunctiva/sclera: Conjunctivae normal.  Cardiovascular:     Pulses:          Posterior tibial pulses are 2+ on the right side and 2+ on the left side.  Pulmonary:     Effort: Pulmonary effort is normal. No respiratory distress.  Musculoskeletal:     Comments: Tenderness over inner right thigh and upper calf, no appreciable swelling or overlying skin changes, compartments are soft, normal sensation.  No swelling, skin changes, or deformities of the right knee.  Skin:    General: Skin is warm and dry.  Neurological:     Mental Status: She is alert.  Psychiatric:        Mood and Affect: Mood normal.        Behavior:  Behavior normal.     ED Results / Procedures / Treatments   Labs (all labs ordered are listed, but only abnormal results are displayed) Labs Reviewed  LIPID PANEL - Abnormal; Notable for the following components:      Result Value   LDL Cholesterol 117 (*)    All other components within normal limits  PREGNANCY, URINE  TSH  VITAMIN D 25 HYDROXY (VIT D DEFICIENCY, FRACTURES)  HEMOGLOBIN A1C    EKG None  Radiology VAS [name] LOWER EXTREMITY VENOUS (DVT) (ONLY [facility] & [facility])  Result Date: 10/22/2022  Lower Venous DVT Study Patient Name:  [facility]  Date of Exam:   10/22/2022 Medical Rec #: [id number]               Accession #:    [id number] Date of Birth: August 31, 1990  Patient Gender: F Patient Age:   66 years Exam Location:  [facility] Procedure:      VAS [name] LOWER EXTREMITY VENOUS (DVT) Referring Phys: Griffin Basil Cledith Abdou --------------------------------------------------------------------------------  Indications: Pain.  Risk Factors: None identified. Comparison Study: No prior studies. Performing Technologist: Chanda Busing RVT  Examination Guidelines: A complete evaluation includes B-mode imaging, spectral Doppler, color Doppler, and power Doppler as needed of all accessible portions of each vessel. Bilateral testing is considered an integral part of a complete examination. Limited examinations for reoccurring indications may be performed as noted. The reflux portion of the exam is performed with the patient in reverse Trendelenburg.  +---------+---------------+---------+-----------+----------+--------------+ RIGHT    CompressibilityPhasicitySpontaneityPropertiesThrombus Aging +---------+---------------+---------+-----------+----------+--------------+ CFV      Full           Yes      Yes                                 +---------+---------------+---------+-----------+----------+--------------+ SFJ      Full                                                         +---------+---------------+---------+-----------+----------+--------------+ FV Prox  Full                                                        +---------+---------------+---------+-----------+----------+--------------+ FV Mid   Full                                                        +---------+---------------+---------+-----------+----------+--------------+ FV DistalFull                                                        +---------+---------------+---------+-----------+----------+--------------+ PFV      Full                                                        +---------+---------------+---------+-----------+----------+--------------+ POP      Full           Yes      Yes                                 +---------+---------------+---------+-----------+----------+--------------+ PTV      Full                                                        +---------+---------------+---------+-----------+----------+--------------+  PERO     Full                                                        +---------+---------------+---------+-----------+----------+--------------+   +----+---------------+---------+-----------+----------+--------------+ LEFTCompressibilityPhasicitySpontaneityPropertiesThrombus Aging +----+---------------+---------+-----------+----------+--------------+ CFV Full           Yes      Yes                                 +----+---------------+---------+-----------+----------+--------------+    Summary: RIGHT: - There is no evidence of deep vein thrombosis in the lower extremity.  - No cystic structure found in the popliteal fossa.  LEFT: - No evidence of common femoral vein obstruction.   *See table(s) above for measurements and observations.    Preliminary     Procedures Procedures    Medications Ordered in ED Medications  ARIPiprazole (ABILIFY) tablet 2 mg (2 mg Oral Given 10/22/22 0810)  cholecalciferol (VITAMIN  D3) 10 MCG (400 UNIT) tablet 400 Units (400 Units Oral Given 10/22/22 0811)  albuterol (VENTOLIN HFA) 108 (90 Base) MCG/ACT inhaler 2 puff (has no administration in time range)  cyclobenzaprine (FLEXERIL) tablet 10 mg (10 mg Oral Given 10/21/22 2111)  DULoxetine (CYMBALTA) DR capsule 60 mg (60 mg Oral Given 10/22/22 0808)  ferrous sulfate tablet 325 mg (325 mg Oral Given 10/22/22 0808)  mometasone-formoterol (DULERA) 200-5 MCG/ACT inhaler 2 puff (2 puffs Inhalation Given 10/22/22 0809)  gabapentin (NEURONTIN) capsule 600 mg (600 mg Oral Given 10/22/22 1135)  hydrOXYzine (ATARAX) tablet 25 mg (25 mg Oral Given 10/21/22 1149)  ibuprofen (ADVIL) tablet 600 mg (600 mg Oral Given 10/21/22 2112)  magnesium oxide (MAG-OX) tablet 400 mg (400 mg Oral Given 10/22/22 0810)  metoprolol tartrate (LOPRESSOR) tablet 12.5 mg (12.5 mg Oral Given 10/22/22 0809)  OLANZapine zydis (ZYPREXA) disintegrating tablet 5 mg (5 mg Oral Given 10/22/22 1136)  ondansetron (ZOFRAN-ODT) disintegrating tablet 4 mg (has no administration in time range)  pantoprazole (PROTONIX) EC tablet 40 mg (40 mg Oral Given 10/22/22 0644)  polyethylene glycol (MIRALAX / GLYCOLAX) packet 17 g (17 g Oral Given 10/22/22 0818)  prazosin (MINIPRESS) capsule 1 mg (1 mg Oral Given 10/21/22 2111)  senna-docusate (Senokot-S) tablet 1 tablet (1 tablet Oral Given 10/22/22 0818)  sodium bicarbonate tablet 650 mg (650 mg Oral Given 10/22/22 0810)  alum & mag hydroxide-simeth (MAALOX/MYLANTA) 200-200-20 MG/5ML suspension 30 mL (has no administration in time range)  OLANZapine (ZYPREXA) tablet 5 mg (has no administration in time range)    Or  OLANZapine (ZYPREXA) injection 5 mg (has no administration in time range)  OLANZapine (ZYPREXA) tablet 15 mg (15 mg Oral Given 10/21/22 2006)  diclofenac Sodium (VOLTAREN) 1 % topical gel 2 g (2 g Topical Given 10/22/22 1137)  naproxen (NAPROSYN) tablet 500 mg (has no administration in time range)  gabapentin (NEURONTIN) capsule 600  mg (600 mg Oral Given 10/19/22 2113)    ED Course/ Medical Decision Making/ A&P                                 Medical Decision Making  This patient is a 32 y.o. female  who presents to the ED for concern of right calf pain.  Differential diagnoses prior to evaluation: The emergent differential diagnosis includes, but is not limited to,  DVT, cellulitis, septic joint, ligamentous injury, muscle strain. This is not an exhaustive differential.   Past Medical History / Co-morbidities / Social History: depression, schizoaffective disorder, autism, essential tremor, seizures, asthma, no muscular disorder   Additional history: Chart reviewed. Pertinent results include: presenting from [facility] where patient is currently admitted. Reviewed [facility] note from this AM detailing symptoms and recommendation for ER evaluation to rule out blood clot.   Physical Exam: Physical exam performed. The pertinent findings include: Normal vital signs, no acute distress.  Tenderness to palpation of the inner right calf and distal thigh, without appreciable swelling or overlying skin changes.  Right knee is normal.  Neurovascularly intact in bilateral lower extremities.  Compartments are soft.  Lab Tests/Imaging studies: I personally interpreted labs/imaging and the pertinent results include: Ultrasound of the right lower extremity shows no evidence of DVT.  Medications: I ordered medication including Naprosyn.  I have reviewed the patients home medicines and have made adjustments as needed.   Disposition: After consideration of the diagnostic results and the patients response to treatment, I feel that emergency department workup does not suggest an emergent condition requiring admission or immediate intervention beyond what has been performed at this time. The plan is: Discharged back to [facility].  Patient is medically cleared at this time.  No emergent etiology found for her symptoms today.  Recommended NSAIDs, RICE  method, and orthopedic follow-up if symptoms persist.. The patient is safe for discharge and has been instructed to return immediately for worsening symptoms, change in symptoms or any other concerns.  Final Clinical Impression(s) / ED Diagnoses Final diagnoses:  Right leg pain    Rx / DC Orders ED Discharge Orders     None      Portions of this report may have been transcribed using voice recognition software. Every effort was made to ensure accuracy; however, inadvertent computerized transcription errors may be present.    Jeanella Flattery 10/22/22 1319    Lonell Grandchild, MD 10/22/22 1601

## 2022-10-22 NOTE — Progress Notes (Signed)
Patient ID: [name], female   DOB: 1990/07/09, 32 y.o.   MRN: [id number] Patient complaining of pain to the right calf area, states pain is 10, 10 being worst. Area assessed, warmth, no induration, no redness or edema noted to the area. Pt reports history of DVT to her left calf area, able to ambulate with a steady gait. For this hospitalization, pt was initially at the [facility] [place] x 2 months being being treated for an overdose on her prescribed medications. Given her prolonged hospitalization as well as history of DVTs and medications which predispose her to a DVT, we will send her to the [facility] for a medical workup for rule out DVTs to right calf. Pt can return to [facility] if medically cleared. Pt's assigned Rn called and gave report to EDP. Writer called Consulting civil engineer who stated that this suffices & a provider report is not necessary. Advised Charge that pt can be sent back to [facility] if medically cleared.

## 2022-10-22 NOTE — BHH Counselor (Signed)
  10/22/2022  10:08 AM   [name]  Type of note: CST update   CSW called and LVM with [facility] (CST lead) [phone]. CSW will follow up with [facility] Leash to see if they will accept Pt back into the CST program.   Signed:  [name] MSW, LCSWA 10/22/2022  10:08 AM

## 2022-10-22 NOTE — ED Triage Notes (Signed)
Pt sent from [facility] for right leg pain eval, concern for DVT.

## 2022-10-22 NOTE — Progress Notes (Signed)
Right lower extremity venous duplex has been completed. Preliminary results can be found in CV Proc through chart review.  Results were given to Lorin Roemhildt PA.  10/22/22 12:48 PM Olen Cordial RVT

## 2022-10-22 NOTE — ED Notes (Signed)
Pt cleared to return to [facility]. Report called to nurse at [facility]. [place] called for pt to return with [facility]

## 2022-10-22 NOTE — Group Note (Signed)
Date:  10/22/2022 Time:  9:49 AM  Group Topic/Focus:  Goals Group:   The focus of this group is to help patients establish daily goals to achieve during treatment and discuss how the patient can incorporate goal setting into their daily lives to aide in recovery. Orientation:   The focus of this group is to educate the patient on the purpose and policies of crisis stabilization and provide a format to answer questions about their admission.  The group details unit policies and expectations of patients while admitted.    Participation Level:  Active  Participation Quality:  Appropriate and Attentive  Affect:  Appropriate  Cognitive:  Appropriate  Insight: Appropriate  Engagement in Group:  Engaged  Modes of Intervention:  Discussion  Additional Comments:  Patient attended goals group and was attentive the duration of it. Patient's goal was to attend all groups.  [name] T [name] 10/22/2022, 9:49 AM

## 2022-10-22 NOTE — Progress Notes (Signed)
Psychiatric Admission Assessment Adult  Patient Identification: [name] MRN:  [id number] Date of Evaluation:  10/22/2022 Chief Complaint:  Major depressive disorder, recurrent, severe with psychotic features (HCC) [F33.3] Right leg pain [M79.604] Principal Diagnosis: Major depressive disorder, recurrent, severe with psychotic features (HCC) Diagnosis:  Principal Problem:   Major depressive disorder, recurrent, severe with psychotic features (HCC)  History of Present Illness: [name] is a 32 y.o. female with a past history significant for schizoaffective-bipolar type, GAD, PTSD, carrier of fragile x syndrome, ASD v asperger syndrome, borderline personality, NSSIB (cutting), fibromyalgia, multiple suicide attempt, multiple inpt psych admission, who initially presented to [facility] ED 08/22/22 via EMS after ingesting multiple medications (#30 metoprolol 25mg  tablets, #60 Hydroxyzine 25mg , #7 Lexapro 20mg , #21 Gabapentin 600mg , #7 Flexeril 10mg , #7 protonix 40mg  and #7 Prazosin 1mg ) in a suicide attempt. She was placed under IVC and admitted to the ICU where she was intubated; later extubated 08/23/22 then transferred to medical unit where she remained until 10/19/22 when she was transferred to [facility] (26th admission).   24 Hour Chart Review:  Sleep Hours last night: reports that sleep quality was good.  Staff confirms 8.5 hours of sleep last night as per documentation on nursing flow sheets. Nursing Concerns: None  Behavioral episodes in the past 24 hrs: No behavioral issues in the last 24 hours Medication Compliance: compliant  Vital Signs in the past 24 hrs: Occasional episodes of tachycardia since admission PRN Medications in the past 24 hrs: Senna, MiraLAX, ibuprofen, hydroxyzine.  Patient assessment note:  Patient continues to present with a consistently improved mood, and affect is congruent.  She is visible on the unit, interacting with peers, talking and laughing,  making jokes, she is participating in unit activities, talking about the future, what her plans are, her daughter, amongst other things.  Reports that anxiety is still remaining under control. Sleep is still good, reports a good sleep quality last night. Appetite is within normal limits, reports that she ate breakfast earlier today morning, at lunch while hospitalized at the [facility] ED. patient had been sent to the ED earlier today due to concerns for a DVT.  She is at an elevated weeks activities, due to a history significant for DVTs & current medications.  She complained of right calf pain earlier, stated that the pain was a 10, 10 being worst.  Prior to presenting to this hospital, she had been hospitalized at the [facility] x 2 months related to an overdose on her medications, so she was sent to the ED out of caution to rule out thrombosis to RLE.  None was found after she was worked up, and she was sent back to [facility].  Concentration is good. Energy level is moderate as per patient's reports. Continuing to deny suicidal thoughts.  Denies suicidal intent and plan.  Denies having any HI.  Denies having psychotic symptoms.   Denies having side effects to current psychiatric medications.  Agreeable to continuing current medication regimen as listed below, with no changes today.  Projected discharge date remains 9/18.  Discussed the following psychosocial stressors: Lack of current community assertive team. We discussed tentative discharge date of 9/18 since we will have to make sure that pt has community support to sustain her needs outside of this [facility], since she is at a chronic risk of suicide due to a history of multiple hospitalizations related to suicide attempts via overdosing and also related to a high level of impulsivity.  We also  talked about the option of getting her connected to a partial hospitalization program prior to discharge, in the event that has CST is unable to accept  her back as a patient.  Writer called patient's CST member at phone number provided by patient at [phone] [name]), call went to voicemail.  We will try calling again tomorrow.  Labs reviewed:   Associated Signs/Symptoms: Depression Symptoms:  weight gain, (Hypo) Manic Symptoms:   denies any at this time Anxiety Symptoms:   anxiousness Psychotic Symptoms:   denies any PTSD Symptoms: Had a traumatic exposure:  recent significant suicide attempt via overdose, childhood trauma, ex-husband completed suicide Total Time spent with patient: 45 minutes  Past Psychiatric History: Major Depressive Disorder recurrent severe with psychotic features, Borderline Personality Disorder, Asperger Syndrome, Schizoaffective Disorder bipolar type, PTSD, self-injurious behavior, multiple (>20) suicide attempts, non-compliance, multiple inpatient psychiatric hospitalizations, high ED utilization.  Adjustment disorder with mixed anxiety and depressed mood (01/31/2022), Adjustment disorder with mixed disturbance of emotions and conduct (08/03/2019), Anxiety, Asthma, Autism, Bipolar 1 disorder, depressed, severe (HCC) (07/25/2021), Carrier of fragile X syndrome, Chronic constipation, Depression, Drug-seeking behavior, Essential tremor, Headache, Ineffective individual coping (05/16/2022), Insomnia (01/12/2022), Intentional drug overdose (HCC) (06/05/2022), Neuromuscular disorder (HCC), Normocytic anemia (06/05/2022), Overdose (07/22/2017), Overdose of acetaminophen (07/2017), Overdose, intentional self-harm, initial encounter (HCC) (07/20/2021), Paranoia (HCC) (04/22/2021), Personality disorder (HCC), Purposeful non-suicidal drug ingestion (HCC) (06/27/2021), Schizo-affective psychosis (HCC), Schizoaffective disorder (HCC) (07/29/2022), Schizoaffective disorder, bipolar type (HCC), Seizures (HCC), Skin erythema (04/27/2022), Sleep apnea, Suicidal behavior (07/25/2021), Suicidal ideation, Suicide (HCC) (07/01/2021),  and Suicide attempt (HCC) (07/04/2021).   Social History:  Childhood (bring, raised, lives now, parents, siblings, schooling, education): Patient was born and raised in [place], [place], patient has 2 brothers, patient has 3 associate degrees.  Abuse: Sexual, Physical, Emotional, Verbal  Marital Status: widow Sexual orientation: heterosexual  Children:1 daughter  Employment: unemployed Peer Group: CST Housing: Patient rents an apartment with a friend Finances: Patient receives disability  Legal: denies Hotel manager: denies   Substance Use History: Alcohol: denies Tobacco: denies Illicit drugs: denies Rx drug abuse: denies Rehab hx: denies   Is the patient at risk to self? Yes.    Has the patient been a risk to self in the past 6 months? Yes.    Has the patient been a risk to self within the distant past? Yes.    Is the patient a risk to others? No.  Has the patient been a risk to others in the past 6 months? No.  Has the patient been a risk to others within the distant past? No.   [place] Scale:  Flowsheet Row ED to Hosp-Admission (Current) [facility] Hosp-Admission (Discharged) from 08/22/2022 in [facility] ED from 08/20/2022 in [facility]  C-SSRS RISK CATEGORY High Risk High Risk High Risk        Prior Inpatient Therapy: Yes.   If yes, describe: >20 inpatient admission, >50 ED encounters in 2024; most recently discharge from [facility] 08/02/22 Prior Outpatient Therapy: Yes.   If yes, describe: [facility]. Recently dismissed from CST Team.    Alcohol Screening: 1. How often do you have a drink containing alcohol?: Never 2. How many drinks containing alcohol do you have on a typical day when you are drinking?: 1 or 2 3. How often do you have six or more drinks on one occasion?: Never AUDIT-C Score: 0 4. How often during the  last year have you found that you were  not able to stop drinking once you had started?: Never 5. How often during the last year have you failed to do what was normally expected from you because of drinking?: Never 6. How often during the last year have you needed a first drink in the morning to get yourself going after a heavy drinking session?: Never 7. How often during the last year have you had a feeling of guilt of remorse after drinking?: Never 8. How often during the last year have you been unable to remember what happened the night before because you had been drinking?: Never 9. Have you or someone else been injured as a result of your drinking?: No 10. Has a relative or friend or a doctor or another health worker been concerned about your drinking or suggested you cut down?: No Alcohol Use Disorder Identification Test Final Score (AUDIT): 0 Substance Abuse History in the last 12 months:  No. Consequences of Substance Abuse: NA Previous Psychotropic Medications: Yes  Psychological Evaluations: Yes  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    Chronic constipation    Depression    Drug-seeking behavior    Essential tremor    Headache    Ineffective individual coping 05/16/2022   Insomnia 01/12/2022   Intentional drug overdose (HCC) 06/05/2022   Neuromuscular disorder (HCC)    Normocytic anemia 06/05/2022   Overdose 07/22/2017   Overdose of acetaminophen 07/2017   and other meds   Overdose, intentional self-harm, initial encounter (HCC) 07/20/2021   Paranoia (HCC) 04/22/2021   Personality disorder (HCC)    Purposeful non-suicidal drug ingestion (HCC) 06/27/2021    Schizo-affective psychosis (HCC)    Schizoaffective disorder (HCC) 07/29/2022   Schizoaffective disorder, bipolar type (HCC)    Seizures (HCC)    Last seizure December 2017   Skin erythema 04/27/2022   Sleep apnea    Suicidal behavior 07/25/2021   Suicidal ideation    Suicide (HCC) 07/01/2021   Suicide attempt (HCC) 07/04/2021    Past Surgical History:  Procedure Laterality Date   MOUTH SURGERY  2009 or 2010   Family History:  Family History  Problem Relation Age of Onset   Mental illness Father    Asthma Father    PDD Brother    Seizures Brother    Family Psychiatric  History:  Psych: Father has PTSD, Mother and Brother has Autism, Brother has depression  Psych Rx: SA/HA: denies  Substance use family hx: denies   Tobacco Screening:  Social History   Tobacco Use  Smoking Status Former   Types: Cigarettes  Smokeless Tobacco Never  Tobacco Comments   Smoked for 2  years age [age]    BH Tobacco Counseling     Are you interested in Tobacco Cessation Medications?  N/A, patient does not use tobacco products Counseled patient on smoking cessation:  N/A, patient does not use tobacco products Reason Tobacco Screening Not Completed: No value filed.       Social History:  Social History   Substance and Sexual Activity  Alcohol Use No   Alcohol/week:  1.0 standard drink of alcohol   Types: 1 Standard drinks or equivalent per week   Comment: denies at this time     Social History   Substance and Sexual Activity  Drug Use No   Comment: History of cocaine use at age 8 for 4 months    Additional Social History: Marital status: Widowed Widowed, when?: 2018. Long term relationship, how long?: None. What types of issues is patient dealing with in the relationship?: None What is your sexual orientation?: "straight" Does patient have children?: Yes How many children?: 1 How is patient's relationship with their children?: Pt reports that her daughter lives with her  deceased husband's mother-everything is good.    Allergies:   Allergies  Allergen Reactions   Bee Venom Anaphylaxis   Coconut Flavor Anaphylaxis and Rash   Fish Allergy Anaphylaxis   Geodon [Ziprasidone Hcl] Other (See Comments)    Pt states that this medication causes paralysis of the mouth.     Haloperidol And Related Other (See Comments)    Pt states that this medication causes paralysis of the mouth, jaw locks up   Lithobid [Lithium] Other (See Comments)    Seizure-like activity    Roxicodone [Oxycodone] Other (See Comments)    Hallucinations    Seroquel [Quetiapine] Other (See Comments)    Severe drowsiness - Pt currently taking 12.5 mg BID, she is ok with this dose   Shellfish Allergy Anaphylaxis   Phenergan [Promethazine Hcl] Other (See Comments)    Chest pain     Prilosec [Omeprazole] Nausea And Vomiting and Other (See Comments)    Pt can take protonix with no problems    Sulfa Antibiotics Other (See Comments)    Chest pain    Tegretol [Carbamazepine] Nausea And Vomiting   Prozac [Fluoxetine] Other (See Comments)    Increased Depression and Suicidal thoughts   Tape Other (See Comments)    Skin tears, can only tolerate paper tape.   Tylenol [Acetaminophen] Rash and Other (See Comments)    Rash on face    Lab Results:  Results for orders placed or performed during the hospital encounter of 10/19/22 (from the past 48 hour(s))  Pregnancy, urine     Status: None   Collection Time: 10/21/22  7:03 PM  Result Value Ref Range   Preg Test, Ur NEGATIVE NEGATIVE    Comment:        THE SENSITIVITY OF THIS METHODOLOGY IS >25 mIU/mL. Performed at [facility], [address] [place], [place] [address]   TSH     Status: None   Collection Time: 10/22/22  6:43 AM  Result Value Ref Range   TSH 2.850 0.350 - 4.500 uIU/mL    Comment: Performed by a 3rd Generation assay with a functional sensitivity of <=0.01 uIU/mL. Performed at [facility], [address] [place], [place] [address]   Lipid panel     Status: Abnormal   Collection Time: 10/22/22  6:43 AM  Result Value Ref Range   Cholesterol 192 0 - 200 mg/dL   Triglycerides 725 <366 mg/dL   HDL 46 >44 mg/dL   Total CHOL/HDL Ratio 4.2 RATIO   VLDL 29 0 - 40 mg/dL   LDL Cholesterol 034 (H) 0 - 99 mg/dL    Comment:        Total Cholesterol/HDL:CHD Risk Coronary Heart Disease Risk Table  Men   Women  1/2 Average Risk   3.4   3.3  Average Risk       5.0   4.4  2 X Average Risk   9.6   7.1  3 X Average Risk  23.4   11.0        Use the calculated Patient Ratio above and the CHD Risk Table to determine the patient's CHD Risk.        ATP III CLASSIFICATION (LDL):  <100     mg/dL   Optimal  956-213  mg/dL   Near or Above                    Optimal  130-159  mg/dL   Borderline  086-578  mg/dL   High  >469     mg/dL   Very High Performed at [facility], [address], [place], [place] [address]   VITAMIN D 25 Hydroxy (Vit-D Deficiency, Fractures)     Status: None   Collection Time: 10/22/22  6:43 AM  Result Value Ref Range   Vit D, 25-Hydroxy 31.39 30 - 100 ng/mL    Comment: (NOTE) Vitamin D deficiency has been defined by the Institute of Medicine  and an Endocrine Society practice guideline as a level of serum 25-OH  vitamin D less than 20 ng/mL (1,2). The Endocrine Society went on to  further define vitamin D insufficiency as a level between 21 and 29  ng/mL (2).  1. IOM (Institute of Medicine). 2010. Dietary reference intakes for  calcium and D. [place]: [employer]. 2. Holick MF, [place], Bischoff-Ferrari HA, et al. Evaluation,  treatment, and prevention of vitamin D deficiency: an Endocrine  Society clinical practice guideline, JCEM. 2011 Jul; 96(7): 1911-30.  Performed at [facility], [address] [place], [place] [address]     Blood Alcohol level:  Lab Results   Component Value Date   ETH <10 08/22/2022   ETH <10 08/09/2022    Metabolic Disorder Labs:  Lab Results  Component Value Date   HGBA1C 5.2 04/20/2022   MPG 103 04/20/2022   MPG 96.8 10/07/2021   Lab Results  Component Value Date   PROLACTIN 66.2 (H) 03/02/2021   PROLACTIN 66.1 (H) 02/04/2020   Lab Results  Component Value Date   CHOL 192 10/22/2022   TRIG 143 10/22/2022   HDL 46 10/22/2022   CHOLHDL 4.2 10/22/2022   VLDL 29 10/22/2022   LDLCALC 117 (H) 10/22/2022   LDLCALC 106 (H) 04/20/2022    Current Medications: Current Facility-Administered Medications  Medication Dose Route Frequency Provider Last Rate Last Admin   albuterol (VENTOLIN HFA) 108 (90 Base) MCG/ACT inhaler 2 puff  2 puff Inhalation Q6H PRN Mariel Craft, MD       alum & mag hydroxide-simeth (MAALOX/MYLANTA) 200-200-20 MG/5ML suspension 30 mL  30 mL Oral Q4H PRN Mariel Craft, MD       ARIPiprazole (ABILIFY) tablet 2 mg  2 mg Oral Daily Mariel Craft, MD   2 mg at 10/22/22 0810   cholecalciferol (VITAMIN D3) 10 MCG (400 UNIT) tablet 400 Units  400 Units Oral Daily Mariel Craft, MD   400 Units at 10/22/22 0811   cyclobenzaprine (FLEXERIL) tablet 10 mg  10 mg Oral QHS Mariel Craft, MD   10 mg at 10/21/22 2111   diclofenac Sodium (VOLTAREN) 1 % topical gel 2 g  2 g Topical QID  Starleen Blue, NP   2 g at 10/22/22 1137   DULoxetine (CYMBALTA) DR capsule 60 mg  60 mg Oral Daily Mariel Craft, MD   60 mg at 10/22/22 4132   ferrous sulfate tablet 325 mg  325 mg Oral BID WC Mariel Craft, MD   325 mg at 10/22/22 4401   gabapentin (NEURONTIN) capsule 600 mg  600 mg Oral TID Mariel Craft, MD   600 mg at 10/22/22 1135   hydrOXYzine (ATARAX) tablet 25 mg  25 mg Oral TID PRN Mariel Craft, MD   25 mg at 10/21/22 1149   ibuprofen (ADVIL) tablet 600 mg  600 mg Oral TID PRN Mariel Craft, MD   600 mg at 10/21/22 2112   magnesium oxide (MAG-OX) tablet 400 mg  400 mg Oral BID Mariel Craft, MD   400 mg at 10/22/22 0810   metoprolol tartrate (LOPRESSOR) tablet 12.5 mg  12.5 mg Oral BID Mariel Craft, MD   12.5 mg at 10/22/22 0809   mometasone-formoterol (DULERA) 200-5 MCG/ACT inhaler 2 puff  2 puff Inhalation BID Mariel Craft, MD   2 puff at 10/22/22 0809   OLANZapine (ZYPREXA) tablet 5 mg  5 mg Oral BID PRN Starleen Blue, NP       Or   OLANZapine (ZYPREXA) injection 5 mg  5 mg Intramuscular BID PRN Starleen Blue, NP       OLANZapine (ZYPREXA) tablet 15 mg  15 mg Oral Q24H Hill, Shelbie Hutching, MD   15 mg at 10/21/22 2006   OLANZapine zydis (ZYPREXA) disintegrating tablet 5 mg  5 mg Oral BID AC Mariel Craft, MD   5 mg at 10/22/22 1136   ondansetron (ZOFRAN-ODT) disintegrating tablet 4 mg  4 mg Oral Q8H PRN Mariel Craft, MD       pantoprazole (PROTONIX) EC tablet 40 mg  40 mg Oral QAC breakfast Mariel Craft, MD   40 mg at 10/22/22 0644   polyethylene glycol (MIRALAX / GLYCOLAX) packet 17 g  17 g Oral BID PRN Mariel Craft, MD   17 g at 10/22/22 0818   prazosin (MINIPRESS) capsule 1 mg  1 mg Oral QHS Mariel Craft, MD   1 mg at 10/21/22 2111   senna-docusate (Senokot-S) tablet 1 tablet  1 tablet Oral BID PRN Mariel Craft, MD   1 tablet at 10/22/22 0818   sodium bicarbonate tablet 650 mg  650 mg Oral Daily Mariel Craft, MD   650 mg at 10/22/22 0810   PTA Medications: Medications Prior to Admission  Medication Sig Dispense Refill Last Dose   albuterol (VENTOLIN HFA) 108 (90 Base) MCG/ACT inhaler Inhale 2 puffs into the lungs every 6 (six) hours as needed for wheezing or shortness of breath.      ARIPiprazole (ABILIFY) 2 MG tablet Take 1 tablet (2 mg total) by mouth daily.      cholecalciferol (VITAMIN D3) 10 MCG (400 UNIT) TABS tablet Take 1 tablet (400 Units total) by mouth daily.      cyclobenzaprine (FLEXERIL) 10 MG tablet Take 10 mg by mouth at bedtime.      diclofenac Sodium (VOLTAREN) 1 % GEL Apply 2 g topically 4 (four) times daily.  (Patient taking differently: Apply 2 g topically 4 (four) times daily as needed.)      DULoxetine (CYMBALTA) 60 MG capsule Take 1 capsule (60 mg total) by mouth daily.      ferrous sulfate  325 (65 FE) MG tablet Take 1 tablet (325 mg total) by mouth 2 (two) times daily with a meal.      gabapentin (NEURONTIN) 600 MG tablet Take 600 mg by mouth 3 (three) times daily.      hydrOXYzine (ATARAX) 25 MG tablet Take 1 tablet (25 mg total) by mouth 3 (three) times daily as needed for anxiety. 30 tablet 0    ibuprofen (ADVIL) 600 MG tablet Take 1 tablet (600 mg total) by mouth 3 (three) times daily as needed for mild pain or moderate pain.      LORazepam (ATIVAN) 1 MG tablet Take 1 tablet (1 mg total) by mouth 2 (two) times daily as needed for anxiety (agitation).      magnesium oxide (MAG-OX) 400 (240 Mg) MG tablet Take 1 tablet (400 mg total) by mouth 2 (two) times daily.      metoprolol tartrate (LOPRESSOR) 25 MG tablet Take 0.5 tablets (12.5 mg total) by mouth 2 (two) times daily.      mometasone-formoterol (DULERA) 200-5 MCG/ACT AERO Inhale 2 puffs into the lungs 2 (two) times daily.      OLANZapine (ZYPREXA) 15 MG tablet Take 1 tablet (15 mg total) by mouth every evening. (Patient taking differently: Take 15 mg by mouth every evening. Takes at 8p)      OLANZapine zydis (ZYPREXA) 5 MG disintegrating tablet Take 1 tablet (5 mg total) by mouth 2 (two) times daily before lunch and supper.      ondansetron (ZOFRAN-ODT) 4 MG disintegrating tablet Take 1 tablet (4 mg total) by mouth every 8 (eight) hours as needed for nausea or vomiting.      pantoprazole (PROTONIX) 40 MG tablet Take 40 mg by mouth daily before breakfast.      polyethylene glycol (MIRALAX / GLYCOLAX) 17 g packet Take 17 g by mouth 2 (two) times daily as needed for mild constipation.      prazosin (MINIPRESS) 1 MG capsule Take 1 mg by mouth at bedtime.      senna-docusate (SENOKOT-S) 8.6-50 MG tablet Take 1 tablet by mouth 2 (two) times daily as  needed for moderate constipation. (Patient taking differently: Take 1 tablet by mouth daily.)      sodium bicarbonate 650 MG tablet Take 1 tablet (650 mg total) by mouth daily.       Musculoskeletal: Strength & Muscle Tone: within normal limits Gait & Station: normal Patient leans: N/A  Psychiatric Specialty Exam:  Presentation  General Appearance:  Appropriate for Environment  Eye Contact: Good  Speech: Clear and Coherent  Speech Volume: Normal  Handedness: Right   Mood and Affect  Mood: Depressed  Affect: Congruent   Thought Process  Thought Processes: Coherent  Duration of Psychotic Symptoms: years Past Diagnosis of Schizophrenia or Psychoactive disorder: Yes  Descriptions of Associations:Intact  Orientation:Full (Time, Place and Person)  Thought Content:Logical  Hallucinations:Hallucinations: None  Ideas of Reference:None  Suicidal Thoughts:Suicidal Thoughts: No  Homicidal Thoughts:Homicidal Thoughts: No   Sensorium  Memory: Immediate Good  Judgment: Fair  Insight: Fair   Art therapist  Concentration: Fair  Attention Span: Fair  Recall: Fair  Fund of Knowledge: Fair  Language: Fair   Psychomotor Activity  Psychomotor Activity: Psychomotor Activity: Normal   Assets  Assets: Resilience   Sleep  Sleep: Sleep: Good    Physical Exam: Physical Exam Vitals and nursing note reviewed.  Constitutional:      Appearance: She is obese.  HENT:     Head: Normocephalic.  Nose: Nose normal.     Mouth/Throat:     Mouth: Mucous membranes are moist.     Pharynx: Oropharynx is clear.  Cardiovascular:     Rate and Rhythm: Normal rate.     Pulses: Normal pulses.  Pulmonary:     Effort: Pulmonary effort is normal.  Abdominal:     Palpations: Abdomen is soft.  Musculoskeletal:        General: Normal range of motion.  Neurological:     Mental Status: She is alert and oriented to person, place, and time.   Psychiatric:        Attention and Perception: Attention and perception normal.        Mood and Affect: Affect is blunt.        Speech: Speech normal.        Behavior: Behavior is withdrawn. Behavior is cooperative.        Thought Content: Thought content is not paranoid or delusional. Thought content does not include homicidal or suicidal ideation. Thought content does not include homicidal or suicidal plan.        Cognition and Memory: Cognition and memory normal.        Judgment: Judgment normal. Judgment is not impulsive or inappropriate.    Review of Systems  Constitutional: Negative.   HENT: Negative.    Eyes: Negative.   Respiratory: Negative.    Cardiovascular:  Positive for palpitations.  Gastrointestinal: Negative.   Genitourinary: Negative.   Musculoskeletal:  Positive for myalgias.  Skin: Negative.   Neurological: Negative.   Psychiatric/Behavioral:  Positive for depression. Negative for hallucinations, memory loss, substance abuse and suicidal ideas. The patient is nervous/anxious and has insomnia.    Blood pressure (!) 136/90, pulse 96, temperature 98.2 F (36.8 C), temperature source Oral, resp. rate 18, height 5\' 6"  (1.676 m), weight 107 kg, last menstrual period 08/08/2022, SpO2 100%. Body mass index is 38.09 kg/m.  Treatment Plan Summary: Daily contact with patient to assess and evaluate symptoms and progress in treatment, Medication management, and Plan    Safety and Monitoring: Voluntary admission to inpatient psychiatric unit for safety, stabilization and treatment Daily contact with patient to assess and evaluate symptoms and progress in treatment Patient's case to be discussed in multi-disciplinary team meeting Observation Level : q15 minute checks Vital signs: q12 hours Precautions: Safety                    Medications: Schizoaffective Disorder, Bipolar Type: Abilify 2 mg PO daily  Olanzapine 5 mg BID before lunch and supper Olanzapine 15 mg PO  every night Cymbalta DR 60 mg PO daily  PTSD: Prazosin 1 mg daily HS   Vitamin D Deficiency Vitamin D3 400 units PO daily  Fibromyalgia:  Cyclobenzapine 10 mg PO daily HS Gabapentin 600 mg TID   Anemia:  Ferrous Sulfate 325 mg PO BID with meals  HypoMagnesia:  Magnesium Oxide 400 mg PO BID   Hypertension:  Metoprolol Tartrate 12.5 mg PO BID  Asthma:  Dulera 200-5 mcg/act inhaler 2 puff BID  GERD:  Protonix EC 40 mg PO daily before breakfast Sodium Bicarbonate 650 mg PO daily  OA Diclofenac Sodium 1% TOP 2g QID pain   PRNs: Albuterol 108 mcg/act inhaler 2 puff q6 hrs PRN wheezing, SOB Maalox/Mylanta 30 mL PO q4 hours PRN indigestion Hydroxyzine 25 mg PO TID anxiety Ibuprofen 600 mg PO TID PRN mild - moderate pain Olanzapine 5 mg IM/PO BID PRN agitation Ondansetron ODT 4 mg PO  q8hrs PRN nausea, vomiting Miralax 17 g PO BID PRN mild constipation Senokot-S 1 Tab PO BID PRN moderate constipation  Long Term Goal(s): Improvement in symptoms so as ready for discharge  Short Term Goals: Ability to verbalize feelings will improve, Ability to disclose and discuss suicidal ideas, Ability to demonstrate self-control will improve, Ability to identify and develop effective coping behaviors will improve, and Compliance with prescribed medications will improve  Discharge Planning: Social work and case management to assist with discharge planning and identification of hospital follow-up needs prior to discharge Estimated LOS: 5-7 days Discharge Concerns: Need to establish a safety plan; Medication compliance and effectiveness Discharge Goals: Return home with outpatient referrals for mental health follow-up including medication management/psychotherapy  I certify that inpatient services furnished can reasonably be expected to improve the patient's condition.    Starleen Blue, NP 9/16/20245:20 PM Patient ID: Tashayla Groden, female   DOB: Aug 26, 1990, 32 y.o.   MRN:  [id number]

## 2022-10-22 NOTE — Plan of Care (Signed)
Problem: Activity: Goal: Ability to tolerate increased activity will improve Outcome: Progressing

## 2022-10-22 NOTE — Group Note (Unsigned)
Date:  10/23/2022 Time:  12:27 AM  Group Topic/Focus:  Wrap-Up Group:   The focus of this group is to help patients review their daily goal of treatment and discuss progress on daily workbooks.    Participation Level:  Active  Participation Quality:  Appropriate  Affect:  Appropriate  Cognitive:  Appropriate  Insight: Appropriate  Engagement in Group:  Engaged  Modes of Intervention:  Activity and Socialization  Additional Comments:  The patient stated that she had a bad day. The patient stated that she is no longer being discharged on tomorrow due to an error. The patient also stated that she has been having pain in her leg and rated the current pain in her leg a 8/10. The patient did state that she did visit the hospital to get leg checked out. The patient rated her day a 3/10. The patient participated in the group ice breaker/riddle activity.   Kennieth Francois 10/23/2022, 12:27 AM

## 2022-10-22 NOTE — Plan of Care (Signed)
Nurse discussed coping skills with patient.  

## 2022-10-22 NOTE — Progress Notes (Signed)
   10/22/22 0556  15 Minute Checks  Location Bedroom  Visual Appearance Calm  Behavior Sleeping  Sleep (Behavioral Health Patients Only)  Calculate sleep? (Click Yes once per 24 hr at 0600 safety check) Yes  Documented sleep last 24 hours 8.5

## 2022-10-22 NOTE — Progress Notes (Addendum)
D:  Patient denied SI and HI, contracts for safety.  Denied A/V hallucinations. A:  Medications administered per MD orders.  Emotional support and encouragement given patient. R:  Safety maintained with 15 minute checks.  Patient went to the ER [facility] for possible DVT in her legs.  Tests were negative.

## 2022-10-23 ENCOUNTER — Encounter (HOSPITAL_COMMUNITY): Payer: Self-pay

## 2022-10-23 LAB — HEMOGLOBIN A1C
Hgb A1c MFr Bld: 5.4 % (ref 4.8–5.6)
Mean Plasma Glucose: 108 mg/dL

## 2022-10-23 NOTE — Progress Notes (Signed)
Patient observed banging front of head on door to her room. PRN zyprexa and vistaril administered. Patient then went and sat down and continued to bang back of head on wall. MD saw this and told pt she would have to move to the 500 hall where there is a padded room if it continues.

## 2022-10-23 NOTE — Group Note (Signed)
Date:  10/23/2022 Time:  8:53 PM  Group Topic/Focus:  Wrap-Up Group:   The focus of this group is to help patients review their daily goal of treatment and discuss progress on daily workbooks.    Participation Level:  Active  Participation Quality:  Appropriate and Sharing  Affect:  Appropriate  Cognitive:  Appropriate  Insight: Appropriate  Engagement in Group:  Engaged  Modes of Intervention:  Activity and Socialization  Additional Comments:  The patient shared that it was an "okay" day. The patient shared that she did was unable to get discharged and is waiting for an "air tight" plan before being discharged. The patient stated something positive today was the taco's for dinner. The patient rated her day a 8/10. The patient stated that her goal for tomorrow is to prepare for discharge. The patient did participate in the ice break activity after sharing.   Kennieth Francois 10/23/2022, 8:53 PM

## 2022-10-23 NOTE — Group Note (Signed)
Recreation Therapy Group Note   Group Topic:Animal Assisted Therapy   Group Date: 10/23/2022 Start Time: 0950 End Time: 1035 Facilitators: Verdean Murin, Benito Mccreedy, LRT   Animal-Assisted Activity (AAA) Program Checklist/Progress Note Patient Eligibility Criteria Checklist & Daily Group note for Rec Tx Intervention   AAA/T Program Assumption of Risk Form signed by Patient/ or Parent Legal Guardian YES  Patient reports no fear of animals NO  Patient understands their participation is voluntary YES   Group Description: Patients provided opportunity to interact with trained and credentialed [facility] Therapy dog and the community volunteer/dog handler.    Affect/Mood: N/A   Participation Level: Did not attend    Clinical Observations/Individualized Feedback: Consent form completed by pt recorded fear of dogs, declining participation in AAA programming.  Benito Mccreedy [name], LRT, CTRS 10/23/2022 12:15 PM

## 2022-10-23 NOTE — BH IP Treatment Plan (Signed)
Interdisciplinary Treatment and Diagnostic Plan Update  10/23/2022 Time of Session: 11:45AM [name] MRN: [id number]  Principal Diagnosis: Major depressive disorder, recurrent, severe with psychotic features (HCC)  Secondary Diagnoses: Principal Problem:   Major depressive disorder, recurrent, severe with psychotic features (HCC)   Current Medications:  Current Facility-Administered Medications  Medication Dose Route Frequency Provider Last Rate Last Admin   albuterol (VENTOLIN HFA) 108 (90 Base) MCG/ACT inhaler 2 puff  2 puff Inhalation Q6H PRN Mariel Craft, MD       alum & mag hydroxide-simeth (MAALOX/MYLANTA) 200-200-20 MG/5ML suspension 30 mL  30 mL Oral Q4H PRN Mariel Craft, MD       ARIPiprazole (ABILIFY) tablet 2 mg  2 mg Oral Daily Mariel Craft, MD   2 mg at 10/23/22 0741   cholecalciferol (VITAMIN D3) 10 MCG (400 UNIT) tablet 400 Units  400 Units Oral Daily Mariel Craft, MD   400 Units at 10/23/22 0741   cyclobenzaprine (FLEXERIL) tablet 10 mg  10 mg Oral QHS Mariel Craft, MD   10 mg at 10/22/22 2117   diclofenac Sodium (VOLTAREN) 1 % topical gel 2 g  2 g Topical QID Starleen Blue, NP   2 g at 10/23/22 0742   DULoxetine (CYMBALTA) DR capsule 60 mg  60 mg Oral Daily Mariel Craft, MD   60 mg at 10/23/22 0741   ferrous sulfate tablet 325 mg  325 mg Oral BID WC Mariel Craft, MD   325 mg at 10/23/22 0865   gabapentin (NEURONTIN) capsule 600 mg  600 mg Oral TID Mariel Craft, MD   600 mg at 10/23/22 0741   hydrOXYzine (ATARAX) tablet 25 mg  25 mg Oral TID PRN Mariel Craft, MD   25 mg at 10/22/22 2118   ibuprofen (ADVIL) tablet 600 mg  600 mg Oral TID PRN Mariel Craft, MD   600 mg at 10/22/22 2119   magnesium oxide (MAG-OX) tablet 400 mg  400 mg Oral BID Mariel Craft, MD   400 mg at 10/23/22 0741   metoprolol tartrate (LOPRESSOR) tablet 12.5 mg  12.5 mg Oral BID Mariel Craft, MD   12.5 mg at 10/22/22 1725    mometasone-formoterol (DULERA) 200-5 MCG/ACT inhaler 2 puff  2 puff Inhalation BID Mariel Craft, MD   2 puff at 10/23/22 0741   OLANZapine (ZYPREXA) tablet 5 mg  5 mg Oral BID PRN Starleen Blue, NP       Or   OLANZapine (ZYPREXA) injection 5 mg  5 mg Intramuscular BID PRN Starleen Blue, NP       OLANZapine (ZYPREXA) tablet 15 mg  15 mg Oral Q24H Hill, Shelbie Hutching, MD   15 mg at 10/22/22 2014   OLANZapine zydis (ZYPREXA) disintegrating tablet 5 mg  5 mg Oral BID AC Mariel Craft, MD   5 mg at 10/22/22 1726   ondansetron (ZOFRAN-ODT) disintegrating tablet 4 mg  4 mg Oral Q8H PRN Mariel Craft, MD       pantoprazole (PROTONIX) EC tablet 40 mg  40 mg Oral QAC breakfast Mariel Craft, MD   40 mg at 10/23/22 0647   polyethylene glycol (MIRALAX / GLYCOLAX) packet 17 g  17 g Oral BID PRN Mariel Craft, MD   17 g at 10/22/22 0818   prazosin (MINIPRESS) capsule 1 mg  1 mg Oral QHS Mariel Craft, MD   1 mg at 10/22/22 2118   senna-docusate (  Senokot-S) tablet 1 tablet  1 tablet Oral BID PRN Mariel Craft, MD   1 tablet at 10/22/22 0818   sodium bicarbonate tablet 650 mg  650 mg Oral Daily Mariel Craft, MD   650 mg at 10/23/22 0741   PTA Medications: Medications Prior to Admission  Medication Sig Dispense Refill Last Dose   albuterol (VENTOLIN HFA) 108 (90 Base) MCG/ACT inhaler Inhale 2 puffs into the lungs every 6 (six) hours as needed for wheezing or shortness of breath.      ARIPiprazole (ABILIFY) 2 MG tablet Take 1 tablet (2 mg total) by mouth daily.      cholecalciferol (VITAMIN D3) 10 MCG (400 UNIT) TABS tablet Take 1 tablet (400 Units total) by mouth daily.      cyclobenzaprine (FLEXERIL) 10 MG tablet Take 10 mg by mouth at bedtime.      diclofenac Sodium (VOLTAREN) 1 % GEL Apply 2 g topically 4 (four) times daily. (Patient taking differently: Apply 2 g topically 4 (four) times daily as needed.)      DULoxetine (CYMBALTA) 60 MG capsule Take 1 capsule (60 mg total) by  mouth daily.      ferrous sulfate 325 (65 FE) MG tablet Take 1 tablet (325 mg total) by mouth 2 (two) times daily with a meal.      gabapentin (NEURONTIN) 600 MG tablet Take 600 mg by mouth 3 (three) times daily.      hydrOXYzine (ATARAX) 25 MG tablet Take 1 tablet (25 mg total) by mouth 3 (three) times daily as needed for anxiety. 30 tablet 0    ibuprofen (ADVIL) 600 MG tablet Take 1 tablet (600 mg total) by mouth 3 (three) times daily as needed for mild pain or moderate pain.      LORazepam (ATIVAN) 1 MG tablet Take 1 tablet (1 mg total) by mouth 2 (two) times daily as needed for anxiety (agitation).      magnesium oxide (MAG-OX) 400 (240 Mg) MG tablet Take 1 tablet (400 mg total) by mouth 2 (two) times daily.      metoprolol tartrate (LOPRESSOR) 25 MG tablet Take 0.5 tablets (12.5 mg total) by mouth 2 (two) times daily.      mometasone-formoterol (DULERA) 200-5 MCG/ACT AERO Inhale 2 puffs into the lungs 2 (two) times daily.      OLANZapine (ZYPREXA) 15 MG tablet Take 1 tablet (15 mg total) by mouth every evening. (Patient taking differently: Take 15 mg by mouth every evening. Takes at 8p)      OLANZapine zydis (ZYPREXA) 5 MG disintegrating tablet Take 1 tablet (5 mg total) by mouth 2 (two) times daily before lunch and supper.      ondansetron (ZOFRAN-ODT) 4 MG disintegrating tablet Take 1 tablet (4 mg total) by mouth every 8 (eight) hours as needed for nausea or vomiting.      pantoprazole (PROTONIX) 40 MG tablet Take 40 mg by mouth daily before breakfast.      polyethylene glycol (MIRALAX / GLYCOLAX) 17 g packet Take 17 g by mouth 2 (two) times daily as needed for mild constipation.      prazosin (MINIPRESS) 1 MG capsule Take 1 mg by mouth at bedtime.      senna-docusate (SENOKOT-S) 8.6-50 MG tablet Take 1 tablet by mouth 2 (two) times daily as needed for moderate constipation. (Patient taking differently: Take 1 tablet by mouth daily.)      sodium bicarbonate 650 MG tablet Take 1 tablet (650 mg  total) by mouth  daily.       Patient Stressors: Medication change or noncompliance   Occupational concerns    Patient Strengths: Capable of independent living  General fund of knowledge  Supportive family/friends   Treatment Modalities: Medication Management, Group therapy, Case management,  1 to 1 session with clinician, Psychoeducation, Recreational therapy.   Physician Treatment Plan for Primary Diagnosis: Major depressive disorder, recurrent, severe with psychotic features (HCC) Long Term Goal(s): Improvement in symptoms so as ready for discharge   Short Term Goals: Ability to verbalize feelings will improve Ability to disclose and discuss suicidal ideas Ability to demonstrate self-control will improve Ability to identify and develop effective coping behaviors will improve Compliance with prescribed medications will improve  Medication Management: Evaluate patient's response, side effects, and tolerance of medication regimen.  Therapeutic Interventions: 1 to 1 sessions, Unit Group sessions and Medication administration.  Evaluation of Outcomes: Progressing  Physician Treatment Plan for Secondary Diagnosis: Principal Problem:   Major depressive disorder, recurrent, severe with psychotic features (HCC)  Long Term Goal(s): Improvement in symptoms so as ready for discharge   Short Term Goals: Ability to verbalize feelings will improve Ability to disclose and discuss suicidal ideas Ability to demonstrate self-control will improve Ability to identify and develop effective coping behaviors will improve Compliance with prescribed medications will improve     Medication Management: Evaluate patient's response, side effects, and tolerance of medication regimen.  Therapeutic Interventions: 1 to 1 sessions, Unit Group sessions and Medication administration.  Evaluation of Outcomes: Progressing   RN Treatment Plan for Primary Diagnosis: Major depressive disorder, recurrent,  severe with psychotic features (HCC) Long Term Goal(s): Knowledge of disease and therapeutic regimen to maintain health will improve  Short Term Goals: Ability to remain free from injury will improve, Ability to verbalize frustration and anger appropriately will improve, Ability to participate in decision making will improve, Ability to verbalize feelings will improve, Ability to identify and develop effective coping behaviors will improve, and Compliance with prescribed medications will improve  Medication Management: RN will administer medications as ordered by provider, will assess and evaluate patient's response and provide education to patient for prescribed medication. RN will report any adverse and/or side effects to prescribing provider.  Therapeutic Interventions: 1 on 1 counseling sessions, Psychoeducation, Medication administration, Evaluate responses to treatment, Monitor vital signs and CBGs as ordered, Perform/monitor CIWA, COWS, AIMS and Fall Risk screenings as ordered, Perform wound care treatments as ordered.  Evaluation of Outcomes: Progressing   LCSW Treatment Plan for Primary Diagnosis: Major depressive disorder, recurrent, severe with psychotic features (HCC) Long Term Goal(s): Safe transition to appropriate next level of care at discharge, Engage patient in therapeutic group addressing interpersonal concerns.  Short Term Goals: Engage patient in aftercare planning with referrals and resources, Increase social support, Increase emotional regulation, Facilitate acceptance of mental health diagnosis and concerns, Identify triggers associated with mental health/substance abuse issues, and Increase skills for wellness and recovery  Therapeutic Interventions: Assess for all discharge needs, 1 to 1 time with Social worker, Explore available resources and support systems, Assess for adequacy in community support network, Educate family and significant other(s) on suicide prevention,  Complete Psychosocial Assessment, Interpersonal group therapy.  Evaluation of Outcomes: Progressing   Progress in Treatment: Attending groups: Yes. Participating in groups: Yes. Taking medication as prescribed: Yes. Toleration medication: Yes. Family/Significant other contact made: No, will contact:  Lupita Leash at [place] CST lead [phone] Patient understands diagnosis: Yes. Discussing patient identified problems/goals with staff: Yes. Medical problems stabilized or  resolved: Yes. Denies suicidal/homicidal ideation: Yes. Issues/concerns per patient self-inventory: No.  New problem(s) identified: No, Describe:  none reported  New Short Term/Long Term Goal(s):medication stabilization, elimination of SI thoughts, development of comprehensive mental wellness plan.    Patient Goals:  "to get back in with my CST Team"  Discharge Plan or Barriers: Patient recently admitted. CSW will continue to follow and assess for appropriate referrals and possible discharge planning.    Reason for Continuation of Hospitalization: Depression Medication stabilization Suicidal ideation  Estimated Length of Stay:5-7 days  Last 3 [place] Suicide Severity Risk Score: Flowsheet Row ED to Hosp-Admission (Current) [facility] Hosp-Admission (Discharged) from 08/22/2022 in [facility] from 08/20/2022 in [facility]  C-SSRS RISK CATEGORY High Risk High Risk High Risk       Last [facility] 2/9 Scores:    03/10/2022   10:01 PM 03/10/2022    9:56 PM 02/10/2022    1:49 AM  Depression screen PHQ 2/9  Decreased Interest 1 1 2   Down, Depressed, Hopeless 1 1 2   PHQ - 2 Score 2 2 4   Altered sleeping 1 1 2   Tired, decreased energy 1 1 2   Change in appetite 1 0 2  Feeling bad or failure about yourself  1 1 2   Trouble concentrating 1 1 2   Moving slowly or fidgety/restless 1 1 1   Suicidal  thoughts 1 1 1   PHQ-9 Score 9 8 16   Difficult doing work/chores Somewhat difficult  Very difficult    Scribe for Treatment Team: Izell [place], LCSW 10/23/2022 8:20 AM

## 2022-10-23 NOTE — Progress Notes (Signed)
D: Patient is alert, oriented, and irritable. Denies SI, HI, VH, and verbally contracts for safety. Patient reports AH. Patient had episode of banging head (please see previous progress notes), she has since stopped.   A: Scheduled medications administered per MD order. PRN vistaril, ibuprofen, zyprexa, miralax, and senokot administered. Support provided. Patient educated on safety on the unit and medications. Routine safety checks every 15 minutes. Patient stated understanding to tell nurse about any new physical symptoms. Patient understands to tell staff of any needs.     R: No adverse drug reactions noted. Patient remains safe at this time and will continue to monitor.    10/23/22 1100  Psych Admission Type (Psych Patients Only)  Admission Status Involuntary  Psychosocial Assessment  Patient Complaints Irritability  Eye Contact Fair  Facial Expression Worried;Flat  Affect Anxious;Irritable  Speech Logical/coherent  Interaction Assertive  Motor Activity Restless  Appearance/Hygiene Unremarkable  Behavior Characteristics Irritable  Mood Depressed;Anxious;Irritable  Thought Process  Coherency WDL  Content Blaming others  Delusions None reported or observed  Perception Hallucinations  Hallucination Auditory  Judgment Poor  Confusion None  Danger to Self  Current suicidal ideation? Denies  Self-Injurious Behavior No self-injurious ideation or behavior indicators observed or expressed   Agreement Not to Harm Self Yes  Description of Agreement verbal  Danger to Others  Danger to Others None reported or observed  Danger to Others Abnormal  Harmful Behavior to others No threats or harm toward other people  Destructive Behavior No threats or harm toward property

## 2022-10-23 NOTE — Progress Notes (Signed)
   10/23/22 2150  Psych Admission Type (Psych Patients Only)  Admission Status Involuntary  Psychosocial Assessment  Patient Complaints Irritability  Eye Contact Fair  Facial Expression Worried  Affect Anxious  Speech Logical/coherent  Interaction Assertive  Motor Activity Other (Comment) (wdl)  Appearance/Hygiene Unremarkable  Behavior Characteristics Irritable  Mood Depressed  Thought Process  Coherency WDL  Content Blaming others  Delusions None reported or observed  Perception Hallucinations  Hallucination Auditory  Judgment Poor  Confusion None  Danger to Self  Current suicidal ideation? Denies  Self-Injurious Behavior No self-injurious ideation or behavior indicators observed or expressed   Agreement Not to Harm Self Yes  Description of Agreement verbal contract for safety  Danger to Others  Danger to Others None reported or observed  Danger to Others Abnormal  Harmful Behavior to others No threats or harm toward other people  Destructive Behavior No threats or harm toward property   D: Patient in dayroom irritable about been here and hoping to discharge soon. Pt attended group and engaging therapeutically. A: Medications administered as prescribed. Support and encouragement provided as needed.  R: Patient remains safe on the unit. Plan of care ongoing for safety and stability.

## 2022-10-23 NOTE — [facility] Group Notes (Signed)
[facility] Group Notes:  (Nursing/MHT/Case Management/Adjunct)  Date:  10/23/2022  Time:  9:16 AM  Type of Therapy:  Group Topic/ Focus: Goals Group: The focus of this group is to help patients establish daily goals to achieve during treatment and discuss how the patient can incorporate goal setting into their daily lives to aide in recovery.   Participation Level:  Did Not Attend  Summary of Progress/Problems:  Patient did not attend goals group today. Patient was encouraged but refused.   Daneil Dan 10/23/2022, 9:16 AM

## 2022-10-23 NOTE — Progress Notes (Signed)
   10/22/22 2145  Psych Admission Type (Psych Patients Only)  Admission Status Involuntary  Psychosocial Assessment  Patient Complaints Anxiety;Irritability;Agitation  Eye Contact Intense  Facial Expression Angry;Worried  Affect Anxious;Irritable;Angry  Speech Argumentative  Interaction Childlike;Intrusive;Defensive  Motor Activity Restless  Appearance/Hygiene Unremarkable  Behavior Characteristics Agitated;Irritable  Mood Labile;Irritable  Aggressive Behavior  Targets Self  Type of Behavior Unprovoked  Effect Self-harm  Thought Process  Coherency WDL  Content Blaming others  Delusions None reported or observed  Perception WDL  Hallucination None reported or observed  Judgment Poor  Confusion None  Danger to Self  Current suicidal ideation? Denies  Agreement Not to Harm Self Yes  Description of Agreement Verbal Contract  Danger to Others  Danger to Others None reported or observed

## 2022-10-23 NOTE — Group Note (Signed)
LCSW Group Therapy Note   Group Date: 10/23/2022 Start Time: 1100 End Time: 1200   Type of Therapy and Topic:  Group Therapy: Boundaries  Participation Level:  Did Not Attend  Description of Group: This group will address the use of boundaries in their personal lives. Patients will explore why boundaries are important, the difference between healthy and unhealthy boundaries, and negative and postive outcomes of different boundaries and will look at how boundaries can be crossed.  Patients will be encouraged to identify current boundaries in their own lives and identify what kind of boundary is being set. Facilitators will guide patients in utilizing problem-solving interventions to address and correct types boundaries being used and to address when no boundary is being used. Understanding and applying boundaries will be explored and addressed for obtaining and maintaining a balanced life. Patients will be encouraged to explore ways to assertively make their boundaries and needs known to significant others in their lives, using other group members and facilitator for role play, support, and feedback.  Therapeutic Goals:  1.  Patient will identify areas in their life where setting clear boundaries could be  used to improve their life.  2.  Patient will identify signs/triggers that a boundary is not being respected. 3.  Patient will identify two ways to set boundaries in order to achieve balance in  their lives: 4.  Patient will demonstrate ability to communicate their needs and set boundaries  through discussion and/or role plays  Therapeutic Modalities:   Cognitive Behavioral Therapy Solution-Focused Therapy  Ane Payment, LCSW 10/23/2022  1:26 PM

## 2022-10-23 NOTE — Progress Notes (Signed)
Nurse was called to assess patient. While this nurse was conducting an admission with another patient, she was told that the patient was hitting her head against the nurse station. Staff said it appeared as she was hitting her head against her arm softly, which was leaned against the nurse station. I went into patients room and she was verbalizing how upset she was that she was not being discharged. She appeared to try to hit her head against the wall again but just leaned it against the wall. She stated she was not currently suicidal, she was just ready to leave and getting frustrated.   Patient encouraged to use positive coping skills to reduce her frustration and anxiety. She stated she will contract for safety and will not hit her head again. She verbalized her legs are continuously hurting her and she doesn't know the cause.   Pt offered PRN meds for agitation but declined but she was able to take PRN medications fro anxiety and pain.

## 2022-10-23 NOTE — Progress Notes (Signed)
Pt HR elevated. Pt mood is baseline. She remains anxious and fidgety. Pt stable, no acute distress noted.     10/23/22 0604 10/23/22 0606  Vital Signs  Temp 97.8 F (36.6 C)  --   Temp Source Oral  --   Pulse Rate (!) 119 (!) 126  Pulse Rate Source Monitor Monitor  Resp 14  --   BP 117/75 (!) 128/103  BP Location Left Arm Left Arm  BP Method Automatic Automatic  Patient Position (if appropriate) Sitting Standing  Oxygen Therapy  SpO2 100 % 98 %

## 2022-10-23 NOTE — Progress Notes (Signed)
Psychiatric Admission Assessment Adult  Patient Identification: [name] MRN:  [id number] Date of Evaluation:  10/23/2022 Chief Complaint:  Major depressive disorder, recurrent, severe with psychotic features (HCC) [F33.3] Right leg pain [M79.604] Principal Diagnosis: Major depressive disorder, recurrent, severe with psychotic features (HCC) Diagnosis:  Principal Problem:   Major depressive disorder, recurrent, severe with psychotic features (HCC)  History of Present Illness: [name] is a 32 y.o. female with a past history significant for schizoaffective-bipolar type, GAD, PTSD, carrier of fragile x syndrome, ASD v asperger syndrome, borderline personality, NSSIB (cutting), fibromyalgia, multiple suicide attempt, multiple inpt psych admission, who initially presented to [facility] ED 08/22/22 via EMS after ingesting multiple medications (#30 metoprolol 25mg  tablets, #60 Hydroxyzine 25mg , #7 Lexapro 20mg , #21 Gabapentin 600mg , #7 Flexeril 10mg , #7 protonix 40mg  and #7 Prazosin 1mg ) in a suicide attempt. She was placed under IVC and admitted to the ICU where she was intubated; later extubated 08/23/22 then transferred to medical unit where she remained until 10/19/22 when she was transferred to [facility] (26th admission).   24 Hour Chart Review:  Sleep Hours last night: reports that sleep quality was good.  Staff confirms 8.5 hours of sleep last night as per documentation on nursing flow sheets. Nursing Concerns: None  Behavioral episodes in the past 24 hrs: No behavioral issues in the last 24 hours Medication Compliance: compliant  Vital Signs in the past 24 hrs: Occasional episodes of tachycardia since admission. Pt has verbalized that she is not drinking enough fluids. Fluids being encouraged and staff asked to check vitals for today. PRN Medications in the past 24 hrs: Zyprexa & Hydroxyzine  Patient assessment note: Mood has been irritable today afternoon related to patient's  hopes of being discharged today being thwarted. She verbalizes her frustration at remaining hospitalized, states that she wants to be discharged and was hoping to be discharged today, but isl frustrated that it did not turn out that way. After speaking with patient extensively, she was able to come to an understanding regarding why she is not being discharged today; Safety for patient is first and foremost when it comes to discharging her given her multiple overdoses in the past and impulsivity. As per CSW, her Community support team has agreed to take her back, but they will not monitor her meds. Her live in Room mate has refused to help with monitoring her meds also. She has been referred to an assertive community treatment team and there is a one month wait as per the CSW. We are continuing to explore other options that would be safe for patient to be discharged safety back into the community, while minimizing the risks of danger to herself.  Patient is denying SI/HI/AVH, denies paranoia. She attempted to bang her head on the wall today as per staff due to frustrations at not being discharged, but was able to be deescalated, and given PRN meds. Writer also spent at least 40 minutes with patient providing empathy and active listening. She is contracting to safety on the unit at this time. Concentration is poor. Energy level is moderate as per patient's reports. Continuing to deny suicidal thoughts.  Denies suicidal intent and plan.  Denies having any HI.  Denies having psychotic symptoms.   Denies having side effects to current psychiatric medications.  Agreeable to continuing current medication regimen as listed below, with no changes today.  We will revisit discharge planning on a daily basis, as we explore community options that will be safe for patient at  discharge. Hospitalization remains necessary at this time for patient's safety.  Labs reviewed:   Associated Signs/Symptoms: Depression  Symptoms:  weight gain, (Hypo) Manic Symptoms:   denies any at this time Anxiety Symptoms:   anxiousness Psychotic Symptoms:   denies any PTSD Symptoms: Had a traumatic exposure:  recent significant suicide attempt via overdose, childhood trauma, ex-husband completed suicide Total Time spent with patient: 45 minutes  Past Psychiatric History: Major Depressive Disorder recurrent severe with psychotic features, Borderline Personality Disorder, Asperger Syndrome, Schizoaffective Disorder bipolar type, PTSD, self-injurious behavior, multiple (>20) suicide attempts, non-compliance, multiple inpatient psychiatric hospitalizations, high ED utilization.  Adjustment disorder with mixed anxiety and depressed mood (01/31/2022), Adjustment disorder with mixed disturbance of emotions and conduct (08/03/2019), Anxiety, Asthma, Autism, Bipolar 1 disorder, depressed, severe (HCC) (07/25/2021), Carrier of fragile X syndrome, Chronic constipation, Depression, Drug-seeking behavior, Essential tremor, Headache, Ineffective individual coping (05/16/2022), Insomnia (01/12/2022), Intentional drug overdose (HCC) (06/05/2022), Neuromuscular disorder (HCC), Normocytic anemia (06/05/2022), Overdose (07/22/2017), Overdose of acetaminophen (07/2017), Overdose, intentional self-harm, initial encounter (HCC) (07/20/2021), Paranoia (HCC) (04/22/2021), Personality disorder (HCC), Purposeful non-suicidal drug ingestion (HCC) (06/27/2021), Schizo-affective psychosis (HCC), Schizoaffective disorder (HCC) (07/29/2022), Schizoaffective disorder, bipolar type (HCC), Seizures (HCC), Skin erythema (04/27/2022), Sleep apnea, Suicidal behavior (07/25/2021), Suicidal ideation, Suicide (HCC) (07/01/2021), and Suicide attempt (HCC) (07/04/2021).   Social History:  Childhood (bring, raised, lives now, parents, siblings, schooling, education): Patient was born and raised in [place], [place], patient has 2 brothers, patient has 3 associate degrees.   Abuse: Sexual, Physical, Emotional, Verbal  Marital Status: widow Sexual orientation: heterosexual  Children:1 daughter  Employment: unemployed Peer Group: CST Housing: Patient rents an apartment with a friend Finances: Patient receives disability  Legal: denies Hotel manager: denies   Substance Use History: Alcohol: denies Tobacco: denies Illicit drugs: denies Rx drug abuse: denies Rehab hx: denies   Is the patient at risk to self? Yes.    Has the patient been a risk to self in the past 6 months? Yes.    Has the patient been a risk to self within the distant past? Yes.    Is the patient a risk to others? No.  Has the patient been a risk to others in the past 6 months? No.  Has the patient been a risk to others within the distant past? No.   [place] Scale:  Flowsheet Row ED to Hosp-Admission (Current) [facility] Hosp-Admission (Discharged) from 08/22/2022 [facility] ED from 08/20/2022 in [facility]  C-SSRS RISK CATEGORY High Risk High Risk High Risk        Prior Inpatient Therapy: Yes.   If yes, describe: >20 inpatient admission, >50 ED encounters in 2024; most recently discharge from [facility] 08/02/22 Prior Outpatient Therapy: Yes.   If yes, describe: [facility]. Recently dismissed from CST Team.    Alcohol Screening: 1. How often do you have a drink containing alcohol?: Never 2. How many drinks containing alcohol do you have on a typical day when you are drinking?: 1 or 2 3. How often do you have six or more drinks on one occasion?: Never AUDIT-C Score: 0 4. How often during the last year have you found that you were not able to stop drinking once you had started?: Never 5. How often during the last year have you failed to do what was normally expected from you because of drinking?: Never 6. How often during the last year have  you needed a first drink in the  morning to get yourself going after a heavy drinking session?: Never 7. How often during the last year have you had a feeling of guilt of remorse after drinking?: Never 8. How often during the last year have you been unable to remember what happened the night before because you had been drinking?: Never 9. Have you or someone else been injured as a result of your drinking?: No 10. Has a relative or friend or a doctor or another health worker been concerned about your drinking or suggested you cut down?: No Alcohol Use Disorder Identification Test Final Score (AUDIT): 0 Substance Abuse History in the last 12 months:  No. Consequences of Substance Abuse: NA Previous Psychotropic Medications: Yes  Psychological Evaluations: Yes  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    Chronic constipation    Depression    Drug-seeking behavior    Essential tremor    Headache    Ineffective individual coping 05/16/2022   Insomnia 01/12/2022   Intentional drug overdose (HCC) 06/05/2022   Neuromuscular disorder (HCC)    Normocytic anemia 06/05/2022   Overdose 07/22/2017   Overdose of acetaminophen 07/2017   and other meds   Overdose, intentional self-harm, initial encounter (HCC) 07/20/2021   Paranoia (HCC) 04/22/2021   Personality disorder (HCC)    Purposeful non-suicidal drug ingestion (HCC) 06/27/2021   Schizo-affective psychosis (HCC)    Schizoaffective disorder (HCC) 07/29/2022   Schizoaffective disorder, bipolar type (HCC)    Seizures (HCC)    Last seizure December 2017   Skin erythema 04/27/2022   Sleep apnea    Suicidal behavior 07/25/2021    Suicidal ideation    Suicide (HCC) 07/01/2021   Suicide attempt (HCC) 07/04/2021    Past Surgical History:  Procedure Laterality Date   MOUTH SURGERY  2009 or 2010   Family History:  Family History  Problem Relation Age of Onset   Mental illness Father    Asthma Father    PDD Brother    Seizures Brother    Family Psychiatric  History:  Psych: Father has PTSD, Mother and Brother has Autism, Brother has depression  Psych Rx: SA/HA: denies  Substance use family hx: denies   Tobacco Screening:  Social History   Tobacco Use  Smoking Status Former   Types: Cigarettes  Smokeless Tobacco Never  Tobacco Comments   Smoked for 2  years age [age]    BH Tobacco Counseling     Are you interested in Tobacco Cessation Medications?  N/A, patient does not use tobacco products Counseled patient on smoking cessation:  N/A, patient does not use tobacco products Reason Tobacco Screening Not Completed: No value filed.       Social History:  Social History   Substance and Sexual Activity  Alcohol Use No   Alcohol/week: 1.0 standard drink of alcohol   Types: 1 Standard drinks or equivalent per week   Comment: denies at this time     Social History   Substance and Sexual Activity  Drug Use No   Comment: History of cocaine use at age 67  for 4 months    Additional Social History: Marital status: Widowed Widowed, when?: 2018. Long term relationship, how long?: None. What types of issues is patient dealing with in the relationship?: None What is your sexual orientation?: "straight" Does patient have children?: Yes How many children?: 1 How is patient's relationship with their children?: Pt reports that her daughter lives with her deceased husband's mother-everything is good.    Allergies:   Allergies  Allergen Reactions   Bee Venom Anaphylaxis   Coconut Flavor Anaphylaxis and Rash   Fish Allergy Anaphylaxis   Geodon [Ziprasidone Hcl] Other (See Comments)    Pt states that  this medication causes paralysis of the mouth.     Haloperidol And Related Other (See Comments)    Pt states that this medication causes paralysis of the mouth, jaw locks up   Lithobid [Lithium] Other (See Comments)    Seizure-like activity    Roxicodone [Oxycodone] Other (See Comments)    Hallucinations    Seroquel [Quetiapine] Other (See Comments)    Severe drowsiness - Pt currently taking 12.5 mg BID, she is ok with this dose   Shellfish Allergy Anaphylaxis   Phenergan [Promethazine Hcl] Other (See Comments)    Chest pain     Prilosec [Omeprazole] Nausea And Vomiting and Other (See Comments)    Pt can take protonix with no problems    Sulfa Antibiotics Other (See Comments)    Chest pain    Tegretol [Carbamazepine] Nausea And Vomiting   Prozac [Fluoxetine] Other (See Comments)    Increased Depression and Suicidal thoughts   Tape Other (See Comments)    Skin tears, can only tolerate paper tape.   Tylenol [Acetaminophen] Rash and Other (See Comments)    Rash on face    Lab Results:  Results for orders placed or performed during the hospital encounter of 10/19/22 (from the past 48 hour(s))  Pregnancy, urine     Status: None   Collection Time: 10/21/22  7:03 PM  Result Value Ref Range   Preg Test, Ur NEGATIVE NEGATIVE    Comment:        THE SENSITIVITY OF THIS METHODOLOGY IS >25 mIU/mL. Performed at [facility], [address], [place], [place] [address]   TSH     Status: None   Collection Time: 10/22/22  6:43 AM  Result Value Ref Range   TSH 2.850 0.350 - 4.500 uIU/mL    Comment: Performed by a 3rd Generation assay with a functional sensitivity of <=0.01 uIU/mL. Performed at [facility], [address], [place], [place] [address]   Lipid panel     Status: Abnormal   Collection Time: 10/22/22  6:43 AM  Result Value Ref Range   Cholesterol 192 0 - 200 mg/dL   Triglycerides 563 <875 mg/dL   HDL 46 >64 mg/dL   Total CHOL/HDL Ratio  4.2 RATIO   VLDL 29 0 - 40 mg/dL   LDL Cholesterol 332 (H) 0 - 99 mg/dL    Comment:        Total Cholesterol/HDL:CHD Risk Coronary Heart Disease Risk Table                     Men   Women  1/2 Average Risk   3.4   3.3  Average Risk       5.0   4.4  2 X Average Risk   9.6   7.1  3 X Average Risk  23.4  11.0        Use the calculated Patient Ratio above and the CHD Risk Table to determine the patient's CHD Risk.        ATP III CLASSIFICATION (LDL):  <100     mg/dL   Optimal  657-846  mg/dL   Near or Above                    Optimal  130-159  mg/dL   Borderline  962-952  mg/dL   High  >841     mg/dL   Very High Performed at [facility], [address] [place], [place] [address]   Hemoglobin A1c     Status: None   Collection Time: 10/22/22  6:43 AM  Result Value Ref Range   Hgb A1c MFr Bld 5.4 4.8 - 5.6 %    Comment: (NOTE)         Prediabetes: 5.7 - 6.4         Diabetes: >6.4         Glycemic control for adults with diabetes: <7.0    Mean Plasma Glucose 108 mg/dL    Comment: (NOTE) Performed At: [facility] [address] [place], [place] [id number] Jolene Schimke MD [id number]   VITAMIN D 25 Hydroxy (Vit-D Deficiency, Fractures)     Status: None   Collection Time: 10/22/22  6:43 AM  Result Value Ref Range   Vit D, 25-Hydroxy 31.39 30 - 100 ng/mL    Comment: (NOTE) Vitamin D deficiency has been defined by the Institute of Medicine  and an Endocrine Society practice guideline as a level of serum 25-OH  vitamin D less than 20 ng/mL (1,2). The Endocrine Society went on to  further define vitamin D insufficiency as a level between 21 and 29  ng/mL (2).  1. IOM (Institute of Medicine). 2010. Dietary reference intakes for  calcium and D. [place]: [employer]. 2. Holick MF, Binkley [place], Bischoff-Ferrari HA, et al. Evaluation,  treatment, and prevention of vitamin D deficiency: an Endocrine  Society clinical practice  guideline, JCEM. 2011 Jul; 96(7): 1911-30.  Performed at [facility], [address] [place], [place] [address]     Blood Alcohol level:  Lab Results  Component Value Date   ETH <10 08/22/2022   ETH <10 08/09/2022    Metabolic Disorder Labs:  Lab Results  Component Value Date   HGBA1C 5.4 10/22/2022   MPG 108 10/22/2022   MPG 103 04/20/2022   Lab Results  Component Value Date   PROLACTIN 66.2 (H) 03/02/2021   PROLACTIN 66.1 (H) 02/04/2020   Lab Results  Component Value Date   CHOL 192 10/22/2022   TRIG 143 10/22/2022   HDL 46 10/22/2022   CHOLHDL 4.2 10/22/2022   VLDL 29 10/22/2022   LDLCALC 117 (H) 10/22/2022   LDLCALC 106 (H) 04/20/2022    Current Medications: Current Facility-Administered Medications  Medication Dose Route Frequency Provider Last Rate Last Admin   albuterol (VENTOLIN HFA) 108 (90 Base) MCG/ACT inhaler 2 puff  2 puff Inhalation Q6H PRN Mariel Craft, MD       alum & mag hydroxide-simeth (MAALOX/MYLANTA) 200-200-20 MG/5ML suspension 30 mL  30 mL Oral Q4H PRN Mariel Craft, MD       ARIPiprazole (ABILIFY) tablet 2 mg  2 mg Oral Daily Mariel Craft, MD   2 mg at 10/23/22 0741   cholecalciferol (VITAMIN D3) 10 MCG (400 UNIT) tablet  400 Units  400 Units Oral Daily Mariel Craft, MD   400 Units at 10/23/22 0741   cyclobenzaprine (FLEXERIL) tablet 10 mg  10 mg Oral QHS Mariel Craft, MD   10 mg at 10/22/22 2117   diclofenac Sodium (VOLTAREN) 1 % topical gel 2 g  2 g Topical QID Starleen Blue, NP   2 g at 10/23/22 1151   DULoxetine (CYMBALTA) DR capsule 60 mg  60 mg Oral Daily Mariel Craft, MD   60 mg at 10/23/22 0741   ferrous sulfate tablet 325 mg  325 mg Oral BID WC Mariel Craft, MD   325 mg at 10/23/22 1610   gabapentin (NEURONTIN) capsule 600 mg  600 mg Oral TID Mariel Craft, MD   600 mg at 10/23/22 1151   hydrOXYzine (ATARAX) tablet 25 mg  25 mg Oral TID PRN Mariel Craft, MD   25 mg at 10/23/22 1415   ibuprofen  (ADVIL) tablet 600 mg  600 mg Oral TID PRN Mariel Craft, MD   600 mg at 10/23/22 0934   magnesium oxide (MAG-OX) tablet 400 mg  400 mg Oral BID Mariel Craft, MD   400 mg at 10/23/22 0741   metoprolol tartrate (LOPRESSOR) tablet 12.5 mg  12.5 mg Oral BID Mariel Craft, MD   12.5 mg at 10/23/22 0933   mometasone-formoterol (DULERA) 200-5 MCG/ACT inhaler 2 puff  2 puff Inhalation BID Mariel Craft, MD   2 puff at 10/23/22 0741   OLANZapine (ZYPREXA) tablet 5 mg  5 mg Oral BID PRN Starleen Blue, NP   5 mg at 10/23/22 1414   Or   OLANZapine (ZYPREXA) injection 5 mg  5 mg Intramuscular BID PRN Starleen Blue, NP       OLANZapine (ZYPREXA) tablet 15 mg  15 mg Oral Q24H Hill, Shelbie Hutching, MD   15 mg at 10/22/22 2014   OLANZapine zydis (ZYPREXA) disintegrating tablet 5 mg  5 mg Oral BID AC Mariel Craft, MD   5 mg at 10/23/22 1151   ondansetron (ZOFRAN-ODT) disintegrating tablet 4 mg  4 mg Oral Q8H PRN Mariel Craft, MD       pantoprazole (PROTONIX) EC tablet 40 mg  40 mg Oral QAC breakfast Mariel Craft, MD   40 mg at 10/23/22 0647   polyethylene glycol (MIRALAX / GLYCOLAX) packet 17 g  17 g Oral BID PRN Mariel Craft, MD   17 g at 10/22/22 0818   prazosin (MINIPRESS) capsule 1 mg  1 mg Oral QHS Mariel Craft, MD   1 mg at 10/22/22 2118   senna-docusate (Senokot-S) tablet 1 tablet  1 tablet Oral BID PRN Mariel Craft, MD   1 tablet at 10/22/22 0818   sodium bicarbonate tablet 650 mg  650 mg Oral Daily Mariel Craft, MD   650 mg at 10/23/22 0741   PTA Medications: Medications Prior to Admission  Medication Sig Dispense Refill Last Dose   albuterol (VENTOLIN HFA) 108 (90 Base) MCG/ACT inhaler Inhale 2 puffs into the lungs every 6 (six) hours as needed for wheezing or shortness of breath.      ARIPiprazole (ABILIFY) 2 MG tablet Take 1 tablet (2 mg total) by mouth daily.      cholecalciferol (VITAMIN D3) 10 MCG (400 UNIT) TABS tablet Take 1 tablet (400 Units total) by  mouth daily.      cyclobenzaprine (FLEXERIL) 10 MG tablet Take 10 mg by mouth  at bedtime.      diclofenac Sodium (VOLTAREN) 1 % GEL Apply 2 g topically 4 (four) times daily. (Patient taking differently: Apply 2 g topically 4 (four) times daily as needed.)      DULoxetine (CYMBALTA) 60 MG capsule Take 1 capsule (60 mg total) by mouth daily.      ferrous sulfate 325 (65 FE) MG tablet Take 1 tablet (325 mg total) by mouth 2 (two) times daily with a meal.      gabapentin (NEURONTIN) 600 MG tablet Take 600 mg by mouth 3 (three) times daily.      hydrOXYzine (ATARAX) 25 MG tablet Take 1 tablet (25 mg total) by mouth 3 (three) times daily as needed for anxiety. 30 tablet 0    ibuprofen (ADVIL) 600 MG tablet Take 1 tablet (600 mg total) by mouth 3 (three) times daily as needed for mild pain or moderate pain.      LORazepam (ATIVAN) 1 MG tablet Take 1 tablet (1 mg total) by mouth 2 (two) times daily as needed for anxiety (agitation).      magnesium oxide (MAG-OX) 400 (240 Mg) MG tablet Take 1 tablet (400 mg total) by mouth 2 (two) times daily.      metoprolol tartrate (LOPRESSOR) 25 MG tablet Take 0.5 tablets (12.5 mg total) by mouth 2 (two) times daily.      mometasone-formoterol (DULERA) 200-5 MCG/ACT AERO Inhale 2 puffs into the lungs 2 (two) times daily.      OLANZapine (ZYPREXA) 15 MG tablet Take 1 tablet (15 mg total) by mouth every evening. (Patient taking differently: Take 15 mg by mouth every evening. Takes at 8p)      OLANZapine zydis (ZYPREXA) 5 MG disintegrating tablet Take 1 tablet (5 mg total) by mouth 2 (two) times daily before lunch and supper.      ondansetron (ZOFRAN-ODT) 4 MG disintegrating tablet Take 1 tablet (4 mg total) by mouth every 8 (eight) hours as needed for nausea or vomiting.      pantoprazole (PROTONIX) 40 MG tablet Take 40 mg by mouth daily before breakfast.      polyethylene glycol (MIRALAX / GLYCOLAX) 17 g packet Take 17 g by mouth 2 (two) times daily as needed for mild  constipation.      prazosin (MINIPRESS) 1 MG capsule Take 1 mg by mouth at bedtime.      senna-docusate (SENOKOT-S) 8.6-50 MG tablet Take 1 tablet by mouth 2 (two) times daily as needed for moderate constipation. (Patient taking differently: Take 1 tablet by mouth daily.)      sodium bicarbonate 650 MG tablet Take 1 tablet (650 mg total) by mouth daily.       Musculoskeletal: Strength & Muscle Tone: within normal limits Gait & Station: normal Patient leans: N/A  Psychiatric Specialty Exam:  Presentation  General Appearance:  Casual  Eye Contact: Fair  Speech: Clear and Coherent  Speech Volume: Normal  Handedness: Right   Mood and Affect  Mood: Anxious; Dysphoric; Irritable  Affect: Congruent   Thought Process  Thought Processes: Coherent  Duration of Psychotic Symptoms: years Past Diagnosis of Schizophrenia or Psychoactive disorder: Yes  Descriptions of Associations:Intact  Orientation:Full (Time, Place and Person)  Thought Content:Logical  Hallucinations:Hallucinations: None  Ideas of Reference:None  Suicidal Thoughts:Suicidal Thoughts: No  Homicidal Thoughts:Homicidal Thoughts: No   Sensorium  Memory: Immediate Good  Judgment: Fair  Insight: Fair   Executive Functions  Concentration: Poor  Attention Span: Poor  Recall: [employer] of Knowledge:  Fair  Language: Fair   Psychomotor Activity  Psychomotor Activity: Psychomotor Activity: Normal   Assets  Assets: Resilience   Sleep  Sleep: Sleep: Good    Physical Exam: Physical Exam Vitals and nursing note reviewed.  Constitutional:      Appearance: She is obese.  HENT:     Head: Normocephalic.     Nose: Nose normal.     Mouth/Throat:     Mouth: Mucous membranes are moist.     Pharynx: Oropharynx is clear.  Cardiovascular:     Rate and Rhythm: Normal rate.     Pulses: Normal pulses.  Pulmonary:     Effort: Pulmonary effort is normal.  Abdominal:      Palpations: Abdomen is soft.  Musculoskeletal:        General: Normal range of motion.  Neurological:     Mental Status: She is alert and oriented to person, place, and time.  Psychiatric:        Attention and Perception: Attention and perception normal.        Mood and Affect: Affect is blunt.        Speech: Speech normal.        Behavior: Behavior is withdrawn. Behavior is cooperative.        Thought Content: Thought content is not paranoid or delusional. Thought content does not include homicidal or suicidal ideation. Thought content does not include homicidal or suicidal plan.        Cognition and Memory: Cognition and memory normal.        Judgment: Judgment normal. Judgment is not impulsive or inappropriate.    Review of Systems  Constitutional: Negative.   HENT: Negative.    Eyes: Negative.   Respiratory: Negative.    Cardiovascular:  Positive for palpitations.  Gastrointestinal: Negative.   Genitourinary: Negative.   Musculoskeletal:  Positive for myalgias.  Skin: Negative.   Neurological: Negative.   Psychiatric/Behavioral:  Positive for depression. Negative for hallucinations, memory loss, substance abuse and suicidal ideas. The patient is nervous/anxious and has insomnia.    Blood pressure (!) 128/103, pulse (!) 117, temperature 97.8 F (36.6 C), temperature source Oral, resp. rate 14, height 5\' 6"  (1.676 m), weight 107 kg, last menstrual period 08/08/2022, SpO2 99%. Body mass index is 38.09 kg/m.  Treatment Plan Summary: Daily contact with patient to assess and evaluate symptoms and progress in treatment, Medication management, and Plan    Safety and Monitoring: Voluntary admission to inpatient psychiatric unit for safety, stabilization and treatment Daily contact with patient to assess and evaluate symptoms and progress in treatment Patient's case to be discussed in multi-disciplinary team meeting Observation Level : q15 minute checks Vital signs: q12  hours Precautions: Safety                    Medications: Schizoaffective Disorder, Bipolar Type: Abilify 2 mg PO daily  Olanzapine 5 mg BID before lunch and supper Olanzapine 15 mg PO every night Cymbalta DR 60 mg PO daily  PTSD: Prazosin 1 mg daily HS   Vitamin D Deficiency Vitamin D3 400 units PO daily  Fibromyalgia:  Cyclobenzapine 10 mg PO daily HS Gabapentin 600 mg TID   Anemia:  Ferrous Sulfate 325 mg PO BID with meals  HypoMagnesia:  Magnesium Oxide 400 mg PO BID   Hypertension:  Metoprolol Tartrate 12.5 mg PO BID  Asthma:  Dulera 200-5 mcg/act inhaler 2 puff BID  GERD:  Protonix EC 40 mg PO daily before breakfast Sodium Bicarbonate  650 mg PO daily  OA Diclofenac Sodium 1% TOP 2g QID pain   PRNs: Albuterol 108 mcg/act inhaler 2 puff q6 hrs PRN wheezing, SOB Maalox/Mylanta 30 mL PO q4 hours PRN indigestion Hydroxyzine 25 mg PO TID anxiety Ibuprofen 600 mg PO TID PRN mild - moderate pain Olanzapine 5 mg IM/PO BID PRN agitation Ondansetron ODT 4 mg PO q8hrs PRN nausea, vomiting Miralax 17 g PO BID PRN mild constipation Senokot-S 1 Tab PO BID PRN moderate constipation  Long Term Goal(s): Improvement in symptoms so as ready for discharge  Short Term Goals: Ability to verbalize feelings will improve, Ability to disclose and discuss suicidal ideas, Ability to demonstrate self-control will improve, Ability to identify and develop effective coping behaviors will improve, and Compliance with prescribed medications will improve  Discharge Planning: Social work and case management to assist with discharge planning and identification of hospital follow-up needs prior to discharge Estimated LOS: 5-7 days Discharge Concerns: Need to establish a safety plan; Medication compliance and effectiveness Discharge Goals: Return home with outpatient referrals for mental health follow-up including medication management/psychotherapy  I certify that inpatient services  furnished can reasonably be expected to improve the patient's condition.    Starleen Blue, [place] 9/17/20243:10 PM Patient ID: Yocelyn Corban, female   DOB: 1990-09-21, 32 y.o.   MRN: [id number]  Patient ID: Rosalina Trowbridge, female   DOB: 04-09-1990, 32 y.o.   MRN: [id number]

## 2022-10-23 NOTE — [facility] Counselor (Signed)
  10/23/2022  10:50 AM   [name]  Type of note: Safety planning update   CSW spoke with roommate, Fayrene Fearing [phone], roommate stated that they do not always stay at the apartment and they work so are not able to monitor medication for Pt. Roommate was able to confirm that there are no guns or weapons in the apartment.   Signed:  [name] MSW, LCSWA 10/23/2022  10:50 AM

## 2022-10-23 NOTE — [facility] Counselor (Signed)
  10/23/2022  10:49 AM   Jenet Juanetta Gosling  Type of note: CST Team update   CSW spoke with Lupita Leash (CST team lead) [phone], they are willing to take Pt back and continue supporting her when discharged.   Signed:  [name] MSW, [facility] 10/23/2022  10:49 AM

## 2022-10-24 DIAGNOSIS — F333 Major depressive disorder, recurrent, severe with psychotic symptoms: Secondary | ICD-10-CM

## 2022-10-24 MED ORDER — METOPROLOL TARTRATE 25 MG PO TABS
25.0000 mg | ORAL_TABLET | Freq: Two times a day (BID) | ORAL | Status: DC
  Administered 2022-10-24 – 2022-10-31 (×13): 25 mg via ORAL
  Filled 2022-10-24 (×18): qty 1

## 2022-10-24 NOTE — Group Note (Signed)
Recreation Therapy Group Note   Group Topic:Leisure Education  Group Date: 10/24/2022 Start Time: 0935 End Time: 1025 Facilitators: Patirica Longshore-McCall, LRT,CTRS Location: [address] Dayroom   Group Topic: Leisure Education   Goal Area(s) Addresses:  Patient will successfully identify positive leisure and recreation activities.  Patient will acknowledge benefits of participation in healthy leisure activities post discharge.  Patient will actively work with peers toward a shared goal.   Intervention: Group Game    Group Description: Music Trivia. LRT asked patients music questions about oldies but goodies and 90s, 2000s hip hop and r&b. In one group, patients worked together to figure out the next lyric to the songs presented in group. The activity was also used to show patients that leisure didn't have to be an expensive activity and can be enjoyed as a group as well as individually.    Education:  Teacher, English as a foreign language, Leisure as Merchant navy officer, Programmer, applications, Building control surveyor   Education Outcome: Acknowledges education/In group clarification offered/Needs additional education   Affect/Mood: N/A   Participation Level: Did not attend    Clinical Observations/Individualized Feedback:     Plan: Continue to engage patient in RT group sessions 2-3x/week.   Ignace Mandigo-McCall, LRT,CTRS 10/24/2022 12:26 PM

## 2022-10-24 NOTE — Progress Notes (Signed)
Psychiatric Admission Assessment Adult  Patient Identification: [name] MRN:  [id number] Date of Evaluation:  10/24/2022 Chief Complaint:  Major depressive disorder, recurrent, severe with psychotic features (HCC) [F33.3] Right leg pain [M79.604] Principal Diagnosis: Major depressive disorder, recurrent, severe with psychotic features (HCC) Diagnosis:  Principal Problem:   Major depressive disorder, recurrent, severe with psychotic features (HCC)  History of Present Illness: [name] is a 32 y.o. female with a past history significant for schizoaffective-bipolar type, GAD, PTSD, carrier of fragile x syndrome, ASD v asperger syndrome, borderline personality, NSSIB (cutting), fibromyalgia, multiple suicide attempt, multiple inpt psych admission, who initially presented to [facility] ED 08/22/22 via EMS after ingesting multiple medications (#30 metoprolol 25mg  tablets, #60 Hydroxyzine 25mg , #7 Lexapro 20mg , #21 Gabapentin 600mg , #7 Flexeril 10mg , #7 protonix 40mg  and #7 Prazosin 1mg ) in a suicide attempt. She was placed under IVC and admitted to the ICU where she was intubated; later extubated 08/23/22 then transferred to medical unit where she remained until 10/19/22 when she was transferred to [facility] (26th admission).   24 Hour Chart Review:  Patient reports a good sleep quality last night, is compliant with  medications, required hydroxyzine earlier today morning for anxiety, did not require any agitation protocol medications last night.  Blood pressure within normal limits, heart rate has been persistently elevated, but slightly better today at 93, and then 113 later as compared to elevations in the 120s earlier in the week and at time of transfer to this hospital.  Patient assessment note:  We continued to experience safety concerns which have been negatively impacted upon patient's discharge, as her roommate will not take responsibility for managing her medications. CSW  has  referred pt to [facility], and there is a month's wait time to be assessed for services. Her assigned CSW is navigating other assertive community treatment teams such as the [facility], to place referrals so patient can be seen sooner in the community.  If patient is discharged managing her own medications, it would present a high risk of danger to self, due to her impulsivity along with her history of multiple overdoses. We will therefore need to keep patient hospitalized at this time until there is a safe discharge plan in place where someone other than herself can take responsibility for managing her medications.  Patient denies SI, denies HI, denies AVH, she denies paranoia, but has been intermittently irritable today due her lack of comprehension of the fact that hospitalization remains necessary at this time to ensure her safety. She reports good sleep quality last night, reports a fair appetite. Denies physical pain, denies concerns with medications.Continuing meds as listed below and we will continue to revisit discharge planning on a daily basis. No TD/EPS type symptoms found on assessment, and pt denies any feelings of stiffness. AIMS: 0.  Associated Signs/Symptoms: Depression Symptoms:  weight gain, (Hypo) Manic Symptoms:   denies any at this time Anxiety Symptoms:   anxiousness Psychotic Symptoms:   denies any PTSD Symptoms: Had a traumatic exposure:  recent significant suicide attempt via overdose, childhood trauma, ex-husband completed suicide Total Time spent with patient: 45 minutes  Past Psychiatric History: Major Depressive Disorder recurrent severe with psychotic features, Borderline Personality Disorder, Asperger Syndrome, Schizoaffective Disorder bipolar type, PTSD, self-injurious behavior, multiple (>20) suicide attempts, non-compliance, multiple inpatient psychiatric hospitalizations, high ED utilization.  Adjustment disorder with mixed anxiety and depressed mood  (01/31/2022), Adjustment disorder with mixed disturbance of emotions and conduct (08/03/2019), Anxiety, Asthma, Autism, Bipolar 1  disorder, depressed, severe (HCC) (07/25/2021), Carrier of fragile X syndrome, Chronic constipation, Depression, Drug-seeking behavior, Essential tremor, Headache, Ineffective individual coping (05/16/2022), Insomnia (01/12/2022), Intentional drug overdose (HCC) (06/05/2022), Neuromuscular disorder (HCC), Normocytic anemia (06/05/2022), Overdose (07/22/2017), Overdose of acetaminophen (07/2017), Overdose, intentional self-harm, initial encounter (HCC) (07/20/2021), Paranoia (HCC) (04/22/2021), Personality disorder (HCC), Purposeful non-suicidal drug ingestion (HCC) (06/27/2021), Schizo-affective psychosis (HCC), Schizoaffective disorder (HCC) (07/29/2022), Schizoaffective disorder, bipolar type (HCC), Seizures (HCC), Skin erythema (04/27/2022), Sleep apnea, Suicidal behavior (07/25/2021), Suicidal ideation, Suicide (HCC) (07/01/2021), and Suicide attempt (HCC) (07/04/2021).   Social History:  Childhood (bring, raised, lives now, parents, siblings, schooling, education): Patient was born and raised in [place], [place], patient has 2 brothers, patient has 3 associate degrees.  Abuse: Sexual, Physical, Emotional, Verbal  Marital Status: widow Sexual orientation: heterosexual  Children:1 daughter  Employment: unemployed Peer Group: [facility] Housing: Patient rents an apartment with a friend Finances: Patient receives disability  Legal: denies Hotel manager: denies   Substance Use History: Alcohol: denies Tobacco: denies Illicit drugs: denies Rx drug abuse: denies Rehab hx: denies   Is the patient at risk to self? Yes.    Has the patient been a risk to self in the past 6 months? Yes.    Has the patient been a risk to self within the distant past? Yes.    Is the patient a risk to others? No.  Has the patient been a risk to others in the past 6 months? No.  Has the patient been a  risk to others within the distant past? No.   [place] Scale:  Flowsheet Row ED to Hosp-Admission (Current) [facility] Hosp-Admission (Discharged) from 08/22/2022 in [facility] ED from 08/20/2022 in [facility]  C-SSRS RISK CATEGORY High Risk High Risk High Risk        Prior Inpatient Therapy: Yes.   If yes, describe: >20 inpatient admission, >50 ED encounters in 2024; most recently discharge from [facility] 08/02/22 Prior Outpatient Therapy: Yes.   If yes, describe: [facility]. Recently dismissed from CST Team.    Alcohol Screening: 1. How often do you have a drink containing alcohol?: Never 2. How many drinks containing alcohol do you have on a typical day when you are drinking?: 1 or 2 3. How often do you have six or more drinks on one occasion?: Never AUDIT-C Score: 0 4. How often during the last year have you found that you were not able to stop drinking once you had started?: Never 5. How often during the last year have you failed to do what was normally expected from you because of drinking?: Never 6. How often during the last year have you needed a first drink in the morning to get yourself going after a heavy drinking session?: Never 7. How often during the last year have you had a feeling of guilt of remorse after drinking?: Never 8. How often during the last year have you been unable to remember what happened the night before because you had been drinking?: Never 9. Have you or someone else been injured as a result of your drinking?: No 10. Has a relative or friend or a doctor or another health worker been concerned about your drinking or suggested you cut down?: No Alcohol Use Disorder Identification Test Final Score (AUDIT): 0 Substance Abuse History in the last 12 months:  No. Consequences of Substance Abuse: NA Previous Psychotropic Medications: Yes  Psychological Evaluations: Yes  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    Chronic constipation    Depression    Drug-seeking behavior    Essential tremor    Headache    Ineffective individual coping 05/16/2022   Insomnia 01/12/2022   Intentional drug overdose (HCC) 06/05/2022   Neuromuscular disorder (HCC)    Normocytic anemia 06/05/2022   Overdose 07/22/2017   Overdose of acetaminophen 07/2017   and other meds   Overdose, intentional self-harm, initial encounter (HCC) 07/20/2021   Paranoia (HCC) 04/22/2021   Personality disorder (HCC)    Purposeful non-suicidal drug ingestion (HCC) 06/27/2021   Schizo-affective psychosis (HCC)    Schizoaffective disorder (HCC) 07/29/2022   Schizoaffective disorder, bipolar type (HCC)    Seizures (HCC)    Last seizure December 2017   Skin erythema 04/27/2022   Sleep apnea    Suicidal behavior 07/25/2021   Suicidal ideation    Suicide (HCC) 07/01/2021   Suicide attempt (HCC) 07/04/2021    Past Surgical History:  Procedure Laterality Date   MOUTH SURGERY  2009 or 2010   Family History:  Family History  Problem Relation Age of Onset   Mental illness Father    Asthma Father    PDD Brother    Seizures Brother    Family Psychiatric  History:  Psych: Father has PTSD, Mother and Brother has Autism, Brother has depression  Psych Rx: SA/HA: denies  Substance use family hx: denies   Tobacco Screening:  Social History   Tobacco Use  Smoking Status Former   Types: Cigarettes  Smokeless Tobacco Never  Tobacco Comments   Smoked for 2  years age [age]    BH Tobacco Counseling     Are you  interested in Tobacco Cessation Medications?  N/A, patient does not use tobacco products Counseled patient on smoking cessation:  N/A, patient does not use tobacco products Reason Tobacco Screening Not Completed: No value filed.       Social History:  Social History   Substance and Sexual Activity  Alcohol Use No   Alcohol/week: 1.0 standard drink of alcohol   Types: 1 Standard drinks or equivalent per week   Comment: denies at this time     Social History   Substance and Sexual Activity  Drug Use No   Comment: History of cocaine use at age 64 for 4 months    Additional Social History: Marital status: Widowed Widowed, when?: 2018. Long term relationship, how long?: None. What types of issues is patient dealing with in the relationship?: None What is your sexual orientation?: "straight" Does patient have children?: Yes How many children?: 1 How is patient's relationship with their children?: Pt reports that her daughter lives with her deceased husband's mother-everything is good.    Allergies:   Allergies  Allergen Reactions   Bee Venom Anaphylaxis   Coconut Flavor Anaphylaxis and Rash   Fish Allergy Anaphylaxis   Geodon [Ziprasidone Hcl] Other (See Comments)    Pt states that this medication causes paralysis of the mouth.     Haloperidol And Related Other (See Comments)    Pt  states that this medication causes paralysis of the mouth, jaw locks up   Lithobid [Lithium] Other (See Comments)    Seizure-like activity    Roxicodone [Oxycodone] Other (See Comments)    Hallucinations    Seroquel [Quetiapine] Other (See Comments)    Severe drowsiness - Pt currently taking 12.5 mg BID, she is ok with this dose   Shellfish Allergy Anaphylaxis   Phenergan [Promethazine Hcl] Other (See Comments)    Chest pain     Prilosec [Omeprazole] Nausea And Vomiting and Other (See Comments)    Pt can take protonix with no problems    Sulfa Antibiotics Other (See Comments)    Chest  pain    Tegretol [Carbamazepine] Nausea And Vomiting   Prozac [Fluoxetine] Other (See Comments)    Increased Depression and Suicidal thoughts   Tape Other (See Comments)    Skin tears, can only tolerate paper tape.   Tylenol [Acetaminophen] Rash and Other (See Comments)    Rash on face    Lab Results:  No results found for this or any previous visit (from the past 48 hour(s)).   Blood Alcohol level:  Lab Results  Component Value Date   ETH <10 08/22/2022   ETH <10 08/09/2022    Metabolic Disorder Labs:  Lab Results  Component Value Date   HGBA1C 5.4 10/22/2022   MPG 108 10/22/2022   MPG 103 04/20/2022   Lab Results  Component Value Date   PROLACTIN 66.2 (H) 03/02/2021   PROLACTIN 66.1 (H) 02/04/2020   Lab Results  Component Value Date   CHOL 192 10/22/2022   TRIG 143 10/22/2022   HDL 46 10/22/2022   CHOLHDL 4.2 10/22/2022   VLDL 29 10/22/2022   LDLCALC 117 (H) 10/22/2022   LDLCALC 106 (H) 04/20/2022    Current Medications: Current Facility-Administered Medications  Medication Dose Route Frequency Provider Last Rate Last Admin   albuterol (VENTOLIN HFA) 108 (90 Base) MCG/ACT inhaler 2 puff  2 puff Inhalation Q6H PRN Mariel Craft, MD   2 puff at 10/24/22 1315   alum & mag hydroxide-simeth (MAALOX/MYLANTA) 200-200-20 MG/5ML suspension 30 mL  30 mL Oral Q4H PRN Mariel Craft, MD       ARIPiprazole (ABILIFY) tablet 2 mg  2 mg Oral Daily Mariel Craft, MD   2 mg at 10/24/22 0749   cholecalciferol (VITAMIN D3) 10 MCG (400 UNIT) tablet 400 Units  400 Units Oral Daily Mariel Craft, MD   400 Units at 10/24/22 0749   cyclobenzaprine (FLEXERIL) tablet 10 mg  10 mg Oral QHS Mariel Craft, MD   10 mg at 10/23/22 2102   diclofenac Sodium (VOLTAREN) 1 % topical gel 2 g  2 g Topical QID Starleen Blue, NP   2 g at 10/24/22 1611   DULoxetine (CYMBALTA) DR capsule 60 mg  60 mg Oral Daily Mariel Craft, MD   60 mg at 10/24/22 0750   ferrous sulfate tablet 325 mg   325 mg Oral BID WC Mariel Craft, MD   325 mg at 10/24/22 1611   gabapentin (NEURONTIN) capsule 600 mg  600 mg Oral TID Mariel Craft, MD   600 mg at 10/24/22 1609   hydrOXYzine (ATARAX) tablet 25 mg  25 mg Oral TID PRN Mariel Craft, MD   25 mg at 10/24/22 0909   ibuprofen (ADVIL) tablet 600 mg  600 mg Oral TID PRN Mariel Craft, MD   600 mg at 10/23/22 [phone]  magnesium oxide (MAG-OX) tablet 400 mg  400 mg Oral BID Mariel Craft, MD   400 mg at 10/24/22 1609   metoprolol tartrate (LOPRESSOR) tablet 25 mg  25 mg Oral Q12H Massengill, Harrold Donath, MD       mometasone-formoterol [facility]) 200-5 MCG/ACT inhaler 2 puff  2 puff Inhalation BID Mariel Craft, MD   2 puff at 10/24/22 0750   OLANZapine (ZYPREXA) tablet 5 mg  5 mg Oral BID PRN Starleen Blue, NP   5 mg at 10/23/22 1414   Or   OLANZapine (ZYPREXA) injection 5 mg  5 mg Intramuscular BID PRN Starleen Blue, NP       OLANZapine (ZYPREXA) tablet 15 mg  15 mg Oral Q24H Hill, Shelbie Hutching, MD   15 mg at 10/23/22 2049   OLANZapine zydis (ZYPREXA) disintegrating tablet 5 mg  5 mg Oral BID AC Mariel Craft, MD   5 mg at 10/24/22 1610   ondansetron (ZOFRAN-ODT) disintegrating tablet 4 mg  4 mg Oral Q8H PRN Mariel Craft, MD       pantoprazole (PROTONIX) EC tablet 40 mg  40 mg Oral QAC breakfast Mariel Craft, MD   40 mg at 10/24/22 1610   polyethylene glycol (MIRALAX / GLYCOLAX) packet 17 g  17 g Oral BID PRN Mariel Craft, MD   17 g at 10/24/22 1613   prazosin (MINIPRESS) capsule 1 mg  1 mg Oral QHS Mariel Craft, MD   1 mg at 10/23/22 2102   senna-docusate (Senokot-S) tablet 1 tablet  1 tablet Oral BID PRN Mariel Craft, MD   1 tablet at 10/24/22 1613   sodium bicarbonate tablet 650 mg  650 mg Oral Daily Mariel Craft, MD   650 mg at 10/24/22 1201   PTA Medications: Medications Prior to Admission  Medication Sig Dispense Refill Last Dose   albuterol (VENTOLIN HFA) 108 (90 Base) MCG/ACT inhaler Inhale 2 puffs  into the lungs every 6 (six) hours as needed for wheezing or shortness of breath.      ARIPiprazole (ABILIFY) 2 MG tablet Take 1 tablet (2 mg total) by mouth daily.      cholecalciferol (VITAMIN D3) 10 MCG (400 UNIT) TABS tablet Take 1 tablet (400 Units total) by mouth daily.      cyclobenzaprine (FLEXERIL) 10 MG tablet Take 10 mg by mouth at bedtime.      diclofenac Sodium (VOLTAREN) 1 % GEL Apply 2 g topically 4 (four) times daily. (Patient taking differently: Apply 2 g topically 4 (four) times daily as needed.)      DULoxetine (CYMBALTA) 60 MG capsule Take 1 capsule (60 mg total) by mouth daily.      ferrous sulfate 325 (65 FE) MG tablet Take 1 tablet (325 mg total) by mouth 2 (two) times daily with a meal.      gabapentin (NEURONTIN) 600 MG tablet Take 600 mg by mouth 3 (three) times daily.      hydrOXYzine (ATARAX) 25 MG tablet Take 1 tablet (25 mg total) by mouth 3 (three) times daily as needed for anxiety. 30 tablet 0    ibuprofen (ADVIL) 600 MG tablet Take 1 tablet (600 mg total) by mouth 3 (three) times daily as needed for mild pain or moderate pain.      LORazepam (ATIVAN) 1 MG tablet Take 1 tablet (1 mg total) by mouth 2 (two) times daily as needed for anxiety (agitation).      magnesium oxide (MAG-OX) 400 (240  Mg) MG tablet Take 1 tablet (400 mg total) by mouth 2 (two) times daily.      metoprolol tartrate (LOPRESSOR) 25 MG tablet Take 0.5 tablets (12.5 mg total) by mouth 2 (two) times daily.      mometasone-formoterol (DULERA) 200-5 MCG/ACT AERO Inhale 2 puffs into the lungs 2 (two) times daily.      OLANZapine (ZYPREXA) 15 MG tablet Take 1 tablet (15 mg total) by mouth every evening. (Patient taking differently: Take 15 mg by mouth every evening. Takes at 8p)      OLANZapine zydis (ZYPREXA) 5 MG disintegrating tablet Take 1 tablet (5 mg total) by mouth 2 (two) times daily before lunch and supper.      ondansetron (ZOFRAN-ODT) 4 MG disintegrating tablet Take 1 tablet (4 mg total) by  mouth every 8 (eight) hours as needed for nausea or vomiting.      pantoprazole (PROTONIX) 40 MG tablet Take 40 mg by mouth daily before breakfast.      polyethylene glycol (MIRALAX / GLYCOLAX) 17 g packet Take 17 g by mouth 2 (two) times daily as needed for mild constipation.      prazosin (MINIPRESS) 1 MG capsule Take 1 mg by mouth at bedtime.      senna-docusate (SENOKOT-S) 8.6-50 MG tablet Take 1 tablet by mouth 2 (two) times daily as needed for moderate constipation. (Patient taking differently: Take 1 tablet by mouth daily.)      sodium bicarbonate 650 MG tablet Take 1 tablet (650 mg total) by mouth daily.       Musculoskeletal: Strength & Muscle Tone: within normal limits Gait & Station: normal Patient leans: N/A  Psychiatric Specialty Exam:  Presentation  General Appearance:  Appropriate for Environment; Casual  Eye Contact: Fair  Speech: Clear and Coherent  Speech Volume: Normal  Handedness: Right   Mood and Affect  Mood: Depressed; Anxious  Affect: Congruent   Thought Process  Thought Processes: Coherent; Linear  Duration of Psychotic Symptoms: years Past Diagnosis of Schizophrenia or Psychoactive disorder: Yes  Descriptions of Associations:Intact  Orientation:Full (Time, Place and Person)  Thought Content:Illogical  Hallucinations:Hallucinations: None  Ideas of Reference:None  Suicidal Thoughts:Suicidal Thoughts: No  Homicidal Thoughts:Homicidal Thoughts: No   Sensorium  Memory: Immediate Fair  Judgment: Fair  Insight: Poor   Executive Functions  Concentration: Poor  Attention Span: Fair  Recall: Fair  Fund of Knowledge: Fair  Language: Fair   Psychomotor Activity  Psychomotor Activity: Psychomotor Activity: Normal   Assets  Assets: Desire for Improvement; Resilience   Sleep  Sleep: Sleep: Good    Physical Exam: Physical Exam Vitals and nursing note reviewed.  Constitutional:      Appearance: She  is obese.  HENT:     Head: Normocephalic.     Nose: Nose normal.     Mouth/Throat:     Mouth: Mucous membranes are moist.     Pharynx: Oropharynx is clear.  Cardiovascular:     Rate and Rhythm: Normal rate.     Pulses: Normal pulses.  Pulmonary:     Effort: Pulmonary effort is normal.  Abdominal:     Palpations: Abdomen is soft.  Musculoskeletal:        General: Normal range of motion.  Neurological:     Mental Status: She is alert and oriented to person, place, and time.  Psychiatric:        Attention and Perception: Attention and perception normal.        Mood and Affect: Affect is  blunt.        Speech: Speech normal.        Behavior: Behavior is withdrawn. Behavior is cooperative.        Thought Content: Thought content is not paranoid or delusional. Thought content does not include homicidal or suicidal ideation. Thought content does not include homicidal or suicidal plan.        Cognition and Memory: Cognition and memory normal.        Judgment: Judgment normal. Judgment is not impulsive or inappropriate.    Review of Systems  Constitutional: Negative.   HENT: Negative.    Eyes: Negative.   Respiratory: Negative.    Cardiovascular:  Positive for palpitations.  Gastrointestinal: Negative.   Genitourinary: Negative.   Musculoskeletal:  Positive for myalgias.  Skin: Negative.   Neurological: Negative.   Psychiatric/Behavioral:  Positive for depression. Negative for hallucinations, memory loss, substance abuse and suicidal ideas. The patient is nervous/anxious and has insomnia.    Blood pressure 107/68, pulse (!) 113, temperature 97.6 F (36.4 C), temperature source Oral, resp. rate 18, height 5\' 6"  (1.676 m), weight 107 kg, last menstrual period 08/08/2022, SpO2 98%. Body mass index is 38.09 kg/m.  Treatment Plan Summary: Daily contact with patient to assess and evaluate symptoms and progress in treatment, Medication management, and Plan    Safety and  Monitoring: Voluntary admission to inpatient psychiatric unit for safety, stabilization and treatment Daily contact with patient to assess and evaluate symptoms and progress in treatment Patient's case to be discussed in multi-disciplinary team meeting Observation Level : q15 minute checks Vital signs: q12 hours Precautions: Safety                    Medications: Schizoaffective Disorder, Bipolar Type: Abilify 2 mg PO daily  Olanzapine 5 mg BID before lunch and supper Olanzapine 15 mg PO every night Cymbalta DR 60 mg PO daily  PTSD: Prazosin 1 mg daily HS   Vitamin D Deficiency Vitamin D3 400 units PO daily  Fibromyalgia:  Cyclobenzapine 10 mg PO daily HS Gabapentin 600 mg TID   Anemia:  Ferrous Sulfate 325 mg PO BID with meals  HypoMagnesia:  Magnesium Oxide 400 mg PO BID   Hypertension:  Metoprolol Tartrate 12.5 mg PO BID  Asthma:  Dulera 200-5 mcg/act inhaler 2 puff BID  GERD:  Protonix EC 40 mg PO daily before breakfast Sodium Bicarbonate 650 mg PO daily  OA Diclofenac Sodium 1% TOP 2g QID pain   PRNs: Albuterol 108 mcg/act inhaler 2 puff q6 hrs PRN wheezing, SOB Maalox/Mylanta 30 mL PO q4 hours PRN indigestion Hydroxyzine 25 mg PO TID anxiety Ibuprofen 600 mg PO TID PRN mild - moderate pain Olanzapine 5 mg IM/PO BID PRN agitation Ondansetron ODT 4 mg PO q8hrs PRN nausea, vomiting Miralax 17 g PO BID PRN mild constipation Senokot-S 1 Tab PO BID PRN moderate constipation  Long Term Goal(s): Improvement in symptoms so as ready for discharge  Short Term Goals: Ability to verbalize feelings will improve, Ability to disclose and discuss suicidal ideas, Ability to demonstrate self-control will improve, Ability to identify and develop effective coping behaviors will improve, and Compliance with prescribed medications will improve  Discharge Planning: Social work and case management to assist with discharge planning and identification of hospital follow-up needs  prior to discharge Estimated LOS: 5-7 days Discharge Concerns: Need to establish a safety plan; Medication compliance and effectiveness Discharge Goals: Return home with outpatient referrals for mental health follow-up including medication management/psychotherapy  I certify that inpatient services furnished can reasonably be expected to improve the patient's condition.    Starleen Blue, [place] 9/18/20244:35 PM Patient ID: Aimsley Walstad, female   DOB: [birth year], 32 y.o.   MRN: [id number]

## 2022-10-24 NOTE — Plan of Care (Signed)
  Problem: Education: Goal: Knowledge of [facility] Education information/materials will improve Outcome: Progressing Goal: Emotional status will improve Outcome: Progressing Goal: Mental status will improve Outcome: Progressing Goal: Verbalization of understanding the information provided will improve Outcome: Progressing   Problem: Activity: Goal: Interest or engagement in activities will improve Outcome: Progressing Goal: Sleeping patterns will improve Outcome: Progressing   Problem: Coping: Goal: Ability to verbalize frustrations and anger appropriately will improve Outcome: Progressing   Problem: Coping: Goal: Ability to demonstrate self-control will improve Outcome: Progressing

## 2022-10-24 NOTE — Progress Notes (Signed)
   10/24/22 1359  Vital Signs  Pulse Rate 93  BP 128/67  BP Method Automatic   Pt c/o chest pain but it is intermittent, rates it 4/10, states she feels better now that she is laying down. EKG obtained per order, tachycardia noted but otherwise normal EKG. Encouraged pt to rest for a little while, pt is fixated on going home tomorrow. Denies current SI/HI/AVH. Contracts for safety on unit. Endorses some anxiety and depression at times.

## 2022-10-24 NOTE — Progress Notes (Signed)
NO further complaints noted.

## 2022-10-24 NOTE — Progress Notes (Signed)
Pt medication compliant. Pt denies SI/HI as well as thoughts of self harm at this time. Pt reports waiting on [facility] or other supportive outpatient team before she can be discharged. Pt fidgety during conversation with nurse.

## 2022-10-24 NOTE — Plan of Care (Signed)
Problem: Activity: Goal: Ability to tolerate increased activity will improve Outcome: Progressing   Problem: Role Relationship: Goal: Method of communication will improve Outcome: Progressing

## 2022-10-25 NOTE — Progress Notes (Signed)
EKG completed. Placed on patient's chart.

## 2022-10-25 NOTE — Progress Notes (Signed)
[facility] MD Progress Note  10/25/2022 3:45 PM [name]  MRN:  [id number]  Subjective:   [name] is a 32 y.o. female with a past history significant for schizoaffective-bipolar type, GAD, PTSD, carrier of fragile x syndrome, ASD v asperger syndrome, borderline personality, NSSIB (cutting), fibromyalgia, multiple suicide attempt, multiple inpt psych admission, who initially presented to [facility] ED 08/22/22 via EMS after ingesting multiple medications (#30 metoprolol 25mg  tablets, #60 Hydroxyzine 25mg , #7 Lexapro 20mg , #21 Gabapentin 600mg , #7 Flexeril 10mg , #7 protonix 40mg  and #7 Prazosin 1mg ) in a suicide attempt. She was placed under IVC and admitted to the ICU where she was intubated; later extubated 08/23/22 then transferred to medical unit where she remained until 10/19/22 when she was transferred to [facility] (26th admission).   This morning the pt stated that she is ready for discharge, and that her mood is stable, sleeping well, and that her anxiety is at a managable level. She denied to me having any SI, HI, or psychotic symptoms. She denies any changes in energy, concentration or appetite. Denied any s/e to current scheduled psych meds.   Later in the afternoon, pt was hitting head against wall in her room, said she wanted to leave and promised she wouldn't harm self or overdose (as she was harming self).   Discussed w/ SW, we still not have a safe dc plan in place - awaiting on acceptance to act program that can more closely and safely manage her medications - this was d/w the pt.       Principal Problem: Schizoaffective disorder, bipolar type (HCC) Diagnosis: Principal Problem:   Schizoaffective disorder, bipolar type (HCC) Active Problems:   Borderline personality disorder (HCC)   PTSD (post-traumatic stress disorder)   GAD (generalized anxiety disorder)  Total Time spent with patient: 20 minutes  Past Psychiatric History:  Previous Psych Diagnoses:  Schizoaffective disorder bipolar type, Anxiety, Autism spectrum disorder, PTSD, Borderline personality disorder Prior inpatient treatment: multiple Current/prior outpatient treatment: [facility] via [facility]  Prior rehab hx: denies multiple Psychotherapy hx: [facility] History of suicide: 20-30 times by overdosing on pills, cutting or drinking 409 disinfectant History of homicide or aggression: denies Psychiatric medication history:  Previously trialed Tegretol, Haldol, Clozaril, Depakote, Invega, Lamictal, Seroquel, asenapine, abilify, vraylar, Lexapro, Zoloft  Neuromodulation history: denies  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    Chronic constipation    Depression    Drug-seeking behavior    Essential tremor    Headache    Ineffective individual coping 05/16/2022   Insomnia 01/12/2022   Intentional drug overdose (HCC) 06/05/2022   Neuromuscular disorder (HCC)    Normocytic anemia 06/05/2022   Overdose 07/22/2017   Overdose of acetaminophen 07/2017   and other meds   Overdose, intentional self-harm, initial encounter (HCC) 07/20/2021   Paranoia (HCC) 04/22/2021   Personality disorder (HCC)    Purposeful non-suicidal drug ingestion (HCC) 06/27/2021   Schizo-affective psychosis (HCC)    Schizoaffective disorder (HCC) 07/29/2022   Schizoaffective disorder, bipolar type (HCC)    Seizures (HCC)    Last seizure December 2017   Skin erythema 04/27/2022  Sleep apnea    Suicidal behavior 07/25/2021   Suicidal ideation    Suicide (HCC) 07/01/2021   Suicide attempt (HCC) 07/04/2021    Past Surgical History:  Procedure Laterality Date   MOUTH  SURGERY  2009 or 2010   Family History:  Family History  Problem Relation Age of Onset   Mental illness Father    Asthma Father    PDD Brother    Seizures Brother    Family Psychiatric  History: See H&P   Social History:  Social History   Substance and Sexual Activity  Alcohol Use No   Alcohol/week: 1.0 standard drink of alcohol   Types: 1 Standard drinks or equivalent per week   Comment: denies at this time     Social History   Substance and Sexual Activity  Drug Use No   Comment: History of cocaine use at age 65 for 4 months    Social History   Socioeconomic History   Marital status: Widowed    Spouse name: Not on file   Number of children: 0   Years of education: Not on file   Highest education level: Not on file  Occupational History   Occupation: disability  Tobacco Use   Smoking status: Former    Types: Cigarettes   Smokeless tobacco: Never   Tobacco comments:    Smoked for 2  years age [age]  Vaping Use   Vaping status: Never Used  Substance and Sexual Activity   Alcohol use: No    Alcohol/week: 1.0 standard drink of alcohol    Types: 1 Standard drinks or equivalent per week    Comment: denies at this time   Drug use: No    Comment: History of cocaine use at age 42 for 4 months   Sexual activity: Not Currently    Birth control/protection: None  Other Topics Concern   Not on file  Social History Narrative   Marital status: Widowed      Children: daughter      Lives: with boyfriend, in two story home      Employment:  Disability      Tobacco: quit smoking; smoked for two years.      Alcohol ;none      Drugs: none   Has not traveled outside of the country.   Right handed         Patient with hx of Fibromyalgia,Orthostatic Tachycardia,Asthma,Arthritis,Gerd, ASD   Social Determinants of Health   Financial Resource Strain: Not on file  Food Insecurity: No Food Insecurity (10/19/2022)   Hunger Vital Sign    Worried About Running Out of Food in  the Last Year: Never true    Ran Out of Food in the Last Year: Never true  Transportation Needs: No Transportation Needs (10/19/2022)   PRAPARE - Administrator, Civil Service (Medical): No    Lack of Transportation (Non-Medical): No  Physical Activity: Not on file  Stress: Not on file  Social Connections: Not on file   Additional Social History:                          Current Medications: Current Facility-Administered Medications  Medication Dose Route Frequency Provider Last Rate Last Admin   albuterol (VENTOLIN HFA) 108 (90 Base) MCG/ACT inhaler 2 puff  2 puff Inhalation Q6H PRN Mariel Craft, MD   2 puff at 10/24/22 1315   alum & mag hydroxide-simeth (MAALOX/MYLANTA) 200-200-20 MG/5ML suspension  30 mL  30 mL Oral Q4H PRN Mariel Craft, MD       ARIPiprazole (ABILIFY) tablet 2 mg  2 mg Oral Daily Mariel Craft, MD   2 mg at 10/25/22 0757   cholecalciferol (VITAMIN D3) 10 MCG (400 UNIT) tablet 400 Units  400 Units Oral Daily Mariel Craft, MD   400 Units at 10/25/22 0757   cyclobenzaprine (FLEXERIL) tablet 10 mg  10 mg Oral QHS Mariel Craft, MD   10 mg at 10/24/22 2105   diclofenac Sodium (VOLTAREN) 1 % topical gel 2 g  2 g Topical QID Starleen Blue, NP   2 g at 10/25/22 1124   DULoxetine (CYMBALTA) DR capsule 60 mg  60 mg Oral Daily Mariel Craft, MD   60 mg at 10/25/22 0757   ferrous sulfate tablet 325 mg  325 mg Oral BID WC Mariel Craft, MD   325 mg at 10/25/22 0757   gabapentin (NEURONTIN) capsule 600 mg  600 mg Oral TID Mariel Craft, MD   600 mg at 10/25/22 1123   hydrOXYzine (ATARAX) tablet 25 mg  25 mg Oral TID PRN Mariel Craft, MD   25 mg at 10/24/22 2106   ibuprofen (ADVIL) tablet 600 mg  600 mg Oral TID PRN Mariel Craft, MD   600 mg at 10/23/22 0934   magnesium oxide (MAG-OX) tablet 400 mg  400 mg Oral BID Mariel Craft, MD   400 mg at 10/25/22 0756   metoprolol tartrate (LOPRESSOR) tablet 25 mg  25 mg Oral Q12H  Jhada Risk, Harrold Donath, MD   25 mg at 10/25/22 0757   mometasone-formoterol (DULERA) 200-5 MCG/ACT inhaler 2 puff  2 puff Inhalation BID Mariel Craft, MD   2 puff at 10/25/22 0757   OLANZapine (ZYPREXA) tablet 5 mg  5 mg Oral BID PRN Starleen Blue, NP   5 mg at 10/25/22 1022   Or   OLANZapine (ZYPREXA) injection 5 mg  5 mg Intramuscular BID PRN Starleen Blue, NP       OLANZapine (ZYPREXA) tablet 15 mg  15 mg Oral Q24H Hill, Shelbie Hutching, MD   15 mg at 10/24/22 1954   OLANZapine zydis (ZYPREXA) disintegrating tablet 5 mg  5 mg Oral BID AC Mariel Craft, MD   5 mg at 10/25/22 1123   ondansetron (ZOFRAN-ODT) disintegrating tablet 4 mg  4 mg Oral Q8H PRN Mariel Craft, MD       pantoprazole (PROTONIX) EC tablet 40 mg  40 mg Oral QAC breakfast Mariel Craft, MD   40 mg at 10/25/22 7829   polyethylene glycol (MIRALAX / GLYCOLAX) packet 17 g  17 g Oral BID PRN Mariel Craft, MD   17 g at 10/24/22 1613   prazosin (MINIPRESS) capsule 1 mg  1 mg Oral QHS Mariel Craft, MD   1 mg at 10/24/22 2105   senna-docusate (Senokot-S) tablet 1 tablet  1 tablet Oral BID PRN Mariel Craft, MD   1 tablet at 10/24/22 1613   sodium bicarbonate tablet 650 mg  650 mg Oral Daily Mariel Craft, MD   650 mg at 10/25/22 0757    Lab Results: No results found for this or any previous visit (from the past 48 hour(s)).  Blood Alcohol level:  Lab Results  Component Value Date   [facility] <10 08/22/2022   ETH <10 08/09/2022    Metabolic Disorder Labs: Lab Results  Component Value Date  HGBA1C 5.4 10/22/2022   MPG 108 10/22/2022   MPG 103 04/20/2022   Lab Results  Component Value Date   PROLACTIN 66.2 (H) 03/02/2021   PROLACTIN 66.1 (H) 02/04/2020   Lab Results  Component Value Date   CHOL 192 10/22/2022   TRIG 143 10/22/2022   HDL 46 10/22/2022   CHOLHDL 4.2 10/22/2022   VLDL 29 10/22/2022   LDLCALC 117 (H) 10/22/2022   LDLCALC 106 (H) 04/20/2022    Physical Findings: AIMS:  , ,  ,  ,     CIWA:    COWS:     Musculoskeletal: Strength & Muscle Tone: within normal limits Gait & Station: normal Patient leans: N/A  Psychiatric Specialty Exam:  Presentation  General Appearance:  Fairly Groomed  Eye Contact: Fair  Speech: Normal Rate  Speech Volume: Normal  Handedness: Right   Mood and Affect  Mood: Dysphoric; Anxious  Affect: Congruent; Full Range   Thought Process  Thought Processes: Linear  Descriptions of Associations:Intact  Orientation:Full (Time, Place and Person)  Thought Content:Logical  History of Schizophrenia/Schizoaffective disorder:Yes  Duration of Psychotic Symptoms:Greater than six months  Hallucinations:Hallucinations: None  Ideas of Reference:None  Suicidal Thoughts:Suicidal Thoughts: No  Homicidal Thoughts:Homicidal Thoughts: No   Sensorium  Memory: Immediate Good; Recent Good; Remote Good  Judgment: Impaired  Insight: Lacking   Executive Functions  Concentration: Fair  Attention Span: Fair  Recall: Good  Fund of Knowledge: Good  Language: Good   Psychomotor Activity  Psychomotor Activity: Psychomotor Activity: Normal   Assets  Assets: Desire for Improvement; Resilience   Sleep  Sleep: Sleep: Fair    Physical Exam: Physical Exam Vitals reviewed.  Pulmonary:     Effort: Pulmonary effort is normal.  Neurological:     Mental Status: She is alert.     Motor: No weakness.     Gait: Gait normal.    Review of Systems  Constitutional:  Negative for chills and fever.  Cardiovascular:  Negative for chest pain and palpitations.  Neurological:  Negative for dizziness, tingling, tremors and headaches.  Psychiatric/Behavioral:  Negative for depression, hallucinations, memory loss, substance abuse and suicidal ideas. The patient is nervous/anxious. The patient does not have insomnia.   All other systems reviewed and are negative.  Blood pressure 105/72, pulse (!) 118, temperature  98.2 F (36.8 C), temperature source Oral, resp. rate 18, height 5\' 6"  (1.676 m), weight 107 kg, last menstrual period 08/08/2022, SpO2 97%. Body mass index is 38.09 kg/m.   Treatment Plan Summary:  ASSESSMENT:  Diagnoses / Active Problems: Schizoaffective d/o BP type GAD PTSD Borderline PD   PLAN: Safety and Monitoring:  -- Involuntary admission to inpatient psychiatric unit for safety, stabilization and treatment  -- Daily contact with patient to assess and evaluate symptoms and progress in treatment  -- Patient's case to be discussed in multi-disciplinary team meeting  -- Observation Level : q15 minute checks  -- Vital signs:  q12 hours  -- Precautions: suicide, elopement, and assault  2. Psychiatric Diagnoses and Treatment:   Continue Abilify 2 mg PO daily  Continue Olanzapine 5 mg BID before lunch and supper Continue Olanzapine 15 mg PO every night Continue Cymbalta DR 60 mg PO daily Continue Prazosin 1 mg daily HS  Continue Vitamin D3 400 units PO daily  Continue Cyclobenzapine 10 mg PO daily HS Continue Gabapentin 600 mg TID   --  The risks/benefits/side-effects/alternatives to this medication were discussed in detail with the patient and time was given for questions.  The patient consents to medication trial.    -- Metabolic profile and EKG monitoring obtained while on an atypical antipsychotic (BMI: Lipid Panel: HbgA1c: QTc:)   -- Encouraged patient to participate in unit milieu and in scheduled group therapies     3. Medical Issues Being Addressed:  HTN on metoprolol 25 mg bid  Anemia on iron  Gerd on protonix    4. Discharge Planning:   -- Social work and case management to assist with discharge planning and identification of hospital follow-up needs prior to discharge  -- Estimated LOS: 5-7 days  -- Discharge Concerns: Need to establish a safety plan; Medication compliance and effectiveness  -- Discharge Goals: Return home with outpatient referrals for  mental health follow-up including medication management/psychotherapy   Cristy Hilts, MD 10/25/2022, 3:45 PM  Total Time Spent in Direct Patient Care:  I personally spent 25 minutes on the unit in direct patient care. The direct patient care time included face-to-face time with the patient, reviewing the patient's chart, communicating with other professionals, and coordinating care. Greater than 50% of this time was spent in counseling or coordinating care with the patient regarding goals of hospitalization, psycho-education, and discharge planning needs.   Phineas Inches, MD Psychiatrist

## 2022-10-25 NOTE — Progress Notes (Addendum)
Patient found slamming her hand into the wall at 1015. Patient reports that she is frustrated because she wants to be discharged. Patient reports that she is hearing voices. RN provided support and encouragement. RN provided alternative coping mechanisms to patient. PRN Zyprexa 5mg  PO administered for auditory hallucinations and agitation. RN walked with patient to the dayroom following medication administration.

## 2022-10-25 NOTE — Group Note (Signed)
LCSW Group Therapy Note   Group Date: 10/25/2022 Start Time: 1100 End Time: 1200  LCSW Group Therapy Note     10/25/2022 1:00 PM     Type of Therapy and Topic:  Group Therapy:  Strengths     Participation Level:  Did Not Attend     Description of Group: In this group patients will be encouraged to explore personal strengths that are conducive to recovery and well-being. They will be guided to discuss their thoughts, feelings, and behaviors related to these strengths. The group will process together ways that individualized strengths help patients build resilience and facilitate positive treatment outcomes. Each patient will be challenged to identify personal strengths that they foster as well as positive characteristics they would like to embody as they progress through treatment.This group will be process-oriented, with patients participating in exploration of their own experiences as well as giving and receiving support and challenge from other group members.     Therapeutic Goals:  1.    Patient will identify a collaborative list of positive characteristics that promote positive treatment outcomes and well-being.  2.    Patient will identify three personal strengths that they currently exemplify.  3.    Patient will identify feelings, thought process and behaviors related to these strengths.  4.    Patient will identify two ways they will use these personal strengths to help them reach their individualized treatment goals.         Summary of Patient Progress    Pt did not attend group       Therapeutic Modalities:    Cognitive Behavioral Therapy  Solution Focused Therapy  Motivational Interviewing   Izell Boonton, LCSWA 10/25/2022  1:00 PM

## 2022-10-25 NOTE — Progress Notes (Signed)
Pt verbalized she was having auditory hallucinations. She was seen in her room talking to herself and become very agitated with her hallucinations.   PRN Zyprexa given for agitation.      10/24/22 2200  Psych Admission Type (Psych Patients Only)  Admission Status Involuntary  Psychosocial Assessment  Patient Complaints Anxiety;Irritability;Worrying  Eye Contact Fair  Facial Expression Anxious;Worried  Affect Anxious;Preoccupied  Furniture conservator/restorer  Appearance/Hygiene Unremarkable  Behavior Characteristics Anxious  Mood Anxious  Thought Process  Coherency WDL  Content Preoccupation  Delusions None reported or observed  Perception Hallucinations  Hallucination Auditory  Judgment Poor  Confusion None  Danger to Self  Current suicidal ideation? Denies  Danger to Others  Danger to Others None reported or observed

## 2022-10-25 NOTE — Plan of Care (Signed)
  Problem: Education: Goal: Emotional status will improve Outcome: Progressing Goal: Mental status will improve Outcome: Progressing   Problem: Activity: Goal: Interest or engagement in activities will improve Outcome: Progressing Goal: Sleeping patterns will improve Outcome: Progressing

## 2022-10-25 NOTE — [facility] Group Notes (Signed)
[facility] Group Notes:  (Nursing/MHT/Case Management/Adjunct)  Date:  10/25/2022  Time:  8:48 PM  Type of Therapy:   Wrap up   Participation Level:  Active  Participation Quality:  Appropriate  Affect:  Appropriate  Cognitive:  Alert and Appropriate  Insight:  Appropriate and Good  Engagement in Group:  Engaged  Modes of Intervention:  Discussion  Summary of Progress/Problems: Pt. Shared how she has been hearing voices all day but she reminds herself how they are not real.   Adelina Mings 10/25/2022, 8:48 PM

## 2022-10-25 NOTE — Progress Notes (Signed)
   10/25/22 0900  Psych Admission Type (Psych Patients Only)  Admission Status Involuntary  Psychosocial Assessment  Patient Complaints Anxiety;Depression  Eye Contact Fair  Facial Expression Anxious;Worried;Wide-eyed  Affect Anxious;Preoccupied  Civil Service fast streamer  Appearance/Hygiene Unremarkable  Behavior Characteristics Anxious  Mood Anxious  Thought Process  Coherency Circumstantial  Content Preoccupation  Delusions None reported or observed  Perception WDL  Hallucination None reported or observed  Judgment Poor  Confusion None  Danger to Self  Current suicidal ideation? Denies  Self-Injurious Behavior No self-injurious ideation or behavior indicators observed or expressed   Agreement Not to Harm Self Yes  Description of Agreement Pt verbally contracts for safety  Danger to Others  Danger to Others None reported or observed  Danger to Others Abnormal  Harmful Behavior to others No threats or harm toward other people

## 2022-10-26 ENCOUNTER — Encounter (HOSPITAL_COMMUNITY): Payer: Self-pay

## 2022-10-26 DIAGNOSIS — F250 Schizoaffective disorder, bipolar type: Principal | ICD-10-CM

## 2022-10-26 NOTE — Progress Notes (Signed)
[facility] MD Progress Note  10/26/2022 10:30 AM [name]  MRN:  [id number]  Subjective:   [name] is a 32 y.o. female with a past history significant for schizoaffective-bipolar type, GAD, PTSD, carrier of fragile x syndrome, ASD v asperger syndrome, borderline personality, NSSIB (cutting), fibromyalgia, multiple suicide attempt, multiple inpt psych admission, who initially presented to [facility] ED 08/22/22 via EMS after ingesting multiple medications (#30 metoprolol 25mg  tablets, #60 Hydroxyzine 25mg , #7 Lexapro 20mg , #21 Gabapentin 600mg , #7 Flexeril 10mg , #7 protonix 40mg  and #7 Prazosin 1mg ) in a suicide attempt. She was placed under IVC and admitted to the ICU where she was intubated; later extubated 08/23/22 then transferred to medical unit where she remained until 10/19/22 when she was transferred to [facility] (26th admission).   Patient seen in the milieu this morning on my approach. She reports that she is doing well this morning. She is still focused on discharge but understands that the ACT team will see the patient early next week before she is discharged. She admits that she has a history of multiple suicide attempts and understands the importance of a safe discharge plan before she leaves the hospital. She has been medication compliant and she denies any issues with treatment. She denies any current SI/HI/AVH.      Principal Problem: Schizoaffective disorder, bipolar type (HCC) Diagnosis: Principal Problem:   Schizoaffective disorder, bipolar type (HCC) Active Problems:   Borderline personality disorder (HCC)   PTSD (post-traumatic stress disorder)   GAD (generalized anxiety disorder)  Total Time spent with patient: 20 minutes  Past Psychiatric History:  Previous Psych Diagnoses: Schizoaffective disorder bipolar type, Anxiety, Autism spectrum disorder, PTSD, Borderline personality disorder Prior inpatient treatment: multiple Current/prior outpatient treatment: [facility]  Prior rehab hx: denies multiple Psychotherapy hx: [facility] History of suicide: 20-30 times by overdosing on pills, cutting or drinking 409 disinfectant History of homicide or aggression: denies Psychiatric medication history:  Previously trialed Tegretol, Haldol, Clozaril, Depakote, Invega, Lamictal, Seroquel, asenapine, abilify, vraylar, Lexapro, Zoloft  Neuromodulation history: denies  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    Chronic constipation    Depression    Drug-seeking behavior    Essential tremor    Headache    Ineffective individual coping 05/16/2022   Insomnia 01/12/2022   Intentional drug overdose (HCC) 06/05/2022   Neuromuscular disorder (HCC)    Normocytic anemia 06/05/2022   Overdose 07/22/2017   Overdose of acetaminophen 07/2017   and other meds   Overdose, intentional self-harm, initial encounter (HCC) 07/20/2021   Paranoia (HCC) 04/22/2021   Personality disorder (HCC)    Purposeful non-suicidal drug ingestion (HCC) 06/27/2021   Schizo-affective psychosis (HCC)    Schizoaffective disorder (HCC) 07/29/2022   Schizoaffective disorder, bipolar type (HCC)    Seizures (HCC)    Last seizure December 2017   Skin erythema 04/27/2022   Sleep apnea    Suicidal behavior 07/25/2021   Suicidal ideation    Suicide (HCC) 07/01/2021   Suicide attempt (HCC) 07/04/2021    Past Surgical History:  Procedure Laterality Date   MOUTH SURGERY  2009 or 2010  Family History:  Family History  Problem Relation Age of Onset   Mental illness Father    Asthma Father    PDD Brother    Seizures Brother    Family Psychiatric   History: See H&P   Social History:  Social History   Substance and Sexual Activity  Alcohol Use No   Alcohol/week: 1.0 standard drink of alcohol   Types: 1 Standard drinks or equivalent per week   Comment: denies at this time     Social History   Substance and Sexual Activity  Drug Use No   Comment: History of cocaine use at age 41 for 4 months    Social History   Socioeconomic History   Marital status: Widowed    Spouse name: Not on file   Number of children: 0   Years of education: Not on file   Highest education level: Not on file  Occupational History   Occupation: disability  Tobacco Use   Smoking status: Former    Types: Cigarettes   Smokeless tobacco: Never   Tobacco comments:    Smoked for 2  years age [age]  Vaping Use   Vaping status: Never Used  Substance and Sexual Activity   Alcohol use: No    Alcohol/week: 1.0 standard drink of alcohol    Types: 1 Standard drinks or equivalent per week    Comment: denies at this time   Drug use: No    Comment: History of cocaine use at age 64 for 4 months   Sexual activity: Not Currently    Birth control/protection: None  Other Topics Concern   Not on file  Social History Narrative   Marital status: Widowed      Children: daughter      Lives: with boyfriend, in two story home      Employment:  Disability      Tobacco: quit smoking; smoked for two years.      Alcohol ;none      Drugs: none   Has not traveled outside of the country.   Right handed         Patient with hx of Fibromyalgia,Orthostatic Tachycardia,Asthma,Arthritis,Gerd, ASD   Social Determinants of Health   Financial Resource Strain: Not on file  Food Insecurity: No Food Insecurity (10/19/2022)   Hunger Vital Sign    Worried About Running Out of Food in the Last Year: Never true    Ran Out of Food in the Last Year: Never true  Transportation Needs: No Transportation Needs (10/19/2022)   PRAPARE - Administrator, Civil Service  (Medical): No    Lack of Transportation (Non-Medical): No  Physical Activity: Not on file  Stress: Not on file  Social Connections: Not on file   Additional Social History:                          Current Medications: Current Facility-Administered Medications  Medication Dose Route Frequency Provider Last Rate Last Admin   albuterol (VENTOLIN HFA) 108 (90 Base) MCG/ACT inhaler 2 puff  2 puff Inhalation Q6H PRN Mariel Craft, MD   2 puff at 10/24/22 1315   alum & mag hydroxide-simeth (MAALOX/MYLANTA) 200-200-20 MG/5ML suspension 30 mL  30 mL Oral Q4H PRN Mariel Craft, MD       ARIPiprazole (ABILIFY) tablet 2 mg  2 mg Oral Daily Mariel Craft, MD   2 mg at 10/26/22 0753   cholecalciferol (VITAMIN D3)  10 MCG (400 UNIT) tablet 400 Units  400 Units Oral Daily Mariel Craft, MD   400 Units at 10/26/22 0753   cyclobenzaprine (FLEXERIL) tablet 10 mg  10 mg Oral QHS Mariel Craft, MD   10 mg at 10/25/22 2127   diclofenac Sodium (VOLTAREN) 1 % topical gel 2 g  2 g Topical QID Starleen Blue, NP   2 g at 10/26/22 0754   DULoxetine (CYMBALTA) DR capsule 60 mg  60 mg Oral Daily Mariel Craft, MD   60 mg at 10/26/22 0753   ferrous sulfate tablet 325 mg  325 mg Oral BID WC Mariel Craft, MD   325 mg at 10/26/22 0753   gabapentin (NEURONTIN) capsule 600 mg  600 mg Oral TID Mariel Craft, MD   600 mg at 10/26/22 0753   hydrOXYzine (ATARAX) tablet 25 mg  25 mg Oral TID PRN Mariel Craft, MD   25 mg at 10/24/22 2106   ibuprofen (ADVIL) tablet 600 mg  600 mg Oral TID PRN Mariel Craft, MD   600 mg at 10/25/22 1704   magnesium oxide (MAG-OX) tablet 400 mg  400 mg Oral BID Mariel Craft, MD   400 mg at 10/26/22 0753   metoprolol tartrate (LOPRESSOR) tablet 25 mg  25 mg Oral Q12H Massengill, Harrold Donath, MD   25 mg at 10/26/22 0753   mometasone-formoterol (DULERA) 200-5 MCG/ACT inhaler 2 puff  2 puff Inhalation BID Mariel Craft, MD   2 puff at 10/26/22 0754    OLANZapine (ZYPREXA) tablet 5 mg  5 mg Oral BID PRN Starleen Blue, NP   5 mg at 10/25/22 1022   Or   OLANZapine (ZYPREXA) injection 5 mg  5 mg Intramuscular BID PRN Starleen Blue, NP       OLANZapine (ZYPREXA) tablet 15 mg  15 mg Oral Q24H Hill, Shelbie Hutching, MD   15 mg at 10/25/22 2127   OLANZapine zydis (ZYPREXA) disintegrating tablet 5 mg  5 mg Oral BID AC Mariel Craft, MD   5 mg at 10/25/22 1627   ondansetron (ZOFRAN-ODT) disintegrating tablet 4 mg  4 mg Oral Q8H PRN Mariel Craft, MD       pantoprazole (PROTONIX) EC tablet 40 mg  40 mg Oral QAC breakfast Mariel Craft, MD   40 mg at 10/26/22 0647   polyethylene glycol (MIRALAX / GLYCOLAX) packet 17 g  17 g Oral BID PRN Mariel Craft, MD   17 g at 10/24/22 1613   prazosin (MINIPRESS) capsule 1 mg  1 mg Oral QHS Mariel Craft, MD   1 mg at 10/24/22 2105   senna-docusate (Senokot-S) tablet 1 tablet  1 tablet Oral BID PRN Mariel Craft, MD   1 tablet at 10/24/22 1613   sodium bicarbonate tablet 650 mg  650 mg Oral Daily Mariel Craft, MD   650 mg at 10/26/22 1610    Lab Results: No results found for this or any previous visit (from the past 48 hour(s)).  Blood Alcohol level:  Lab Results  Component Value Date   [facility] <10 08/22/2022   ETH <10 08/09/2022    Metabolic Disorder Labs: Lab Results  Component Value Date   HGBA1C 5.4 10/22/2022   MPG 108 10/22/2022   MPG 103 04/20/2022   Lab Results  Component Value Date   PROLACTIN 66.2 (H) 03/02/2021   PROLACTIN 66.1 (H) 02/04/2020   Lab Results  Component Value Date  CHOL 192 10/22/2022   TRIG 143 10/22/2022   HDL 46 10/22/2022   CHOLHDL 4.2 10/22/2022   VLDL 29 10/22/2022   LDLCALC 117 (H) 10/22/2022   LDLCALC 106 (H) 04/20/2022    Physical Findings: AIMS:  , ,  ,  ,    CIWA:    COWS:     Musculoskeletal: Strength & Muscle Tone: within normal limits Gait & Station: normal Patient leans: N/A  Psychiatric Specialty Exam:  Presentation   General Appearance:  Appropriate for Environment  Eye Contact: Fair  Speech: Normal Rate  Speech Volume: Normal  Handedness: Right   Mood and Affect  Mood: Depressed  Affect: Depressed   Thought Process  Thought Processes: Coherent  Descriptions of Associations:Intact  Orientation:Full (Time, Place and Person)  Thought Content:Logical  History of Schizophrenia/Schizoaffective disorder:Yes  Duration of Psychotic Symptoms:Greater than six months  Hallucinations:Hallucinations: None  Ideas of Reference:None  Suicidal Thoughts:Suicidal Thoughts: No  Homicidal Thoughts:Homicidal Thoughts: No   Sensorium  Memory: Immediate Good; Recent Good  Judgment: Fair  Insight: Fair   Art therapist  Concentration: Good  Attention Span: Good  Recall: Good  Fund of Knowledge: Fair  Language: Good   Psychomotor Activity  Psychomotor Activity: Psychomotor Activity: Normal   Assets  Assets: Desire for Improvement; Resilience   Sleep  Sleep: Sleep: Good    Physical Exam: Physical Exam Vitals reviewed.  Pulmonary:     Effort: Pulmonary effort is normal.  Neurological:     Mental Status: She is alert.     Motor: No weakness.     Gait: Gait normal.    Review of Systems  Constitutional:  Negative for chills and fever.  Cardiovascular:  Negative for chest pain and palpitations.  Neurological:  Negative for dizziness, tingling, tremors and headaches.  Psychiatric/Behavioral:  Negative for depression, hallucinations, memory loss, substance abuse and suicidal ideas. The patient is nervous/anxious. The patient does not have insomnia.   All other systems reviewed and are negative.  Blood pressure (!) 123/103, pulse (!) 112, temperature 98 F (36.7 C), temperature source Oral, resp. rate 18, height 5\' 6"  (1.676 m), weight 107 kg, last menstrual period 08/08/2022, SpO2 98%. Body mass index is 38.09 kg/m.   Treatment Plan  Summary:  ASSESSMENT:  Diagnoses / Active Problems: Schizoaffective d/o BP type GAD PTSD Borderline PD   PLAN: Safety and Monitoring:  -- Involuntary admission to inpatient psychiatric unit for safety, stabilization and treatment  -- Daily contact with patient to assess and evaluate symptoms and progress in treatment  -- Patient's case to be discussed in multi-disciplinary team meeting  -- Observation Level : q15 minute checks  -- Vital signs:  q12 hours  -- Precautions: suicide, elopement, and assault  2. Psychiatric Diagnoses and Treatment:   Continue Abilify 2 mg PO daily  Continue Olanzapine 5 mg BID before lunch and supper Continue Olanzapine 15 mg PO every night Continue Cymbalta DR 60 mg PO daily Continue Prazosin 1 mg daily HS  Continue Vitamin D3 400 units PO daily  Continue Cyclobenzapine 10 mg PO daily HS Continue Gabapentin 600 mg TID   --  The risks/benefits/side-effects/alternatives to this medication were discussed in detail with the patient and time was given for questions. The patient consents to medication trial.    -- Metabolic profile and EKG monitoring obtained while on an atypical antipsychotic (BMI: Lipid Panel: HbgA1c: QTc:)   -- Encouraged patient to participate in unit milieu and in scheduled group therapies     3.  Medical Issues Being Addressed:  HTN on metoprolol 25 mg bid  Anemia on iron  Gerd on protonix    4. Discharge Planning:   -- Social work and case management to assist with discharge planning and identification of hospital follow-up needs prior to discharge  -- Estimated LOS: 5-7 days  -- Discharge Concerns: Need to establish a safety plan; Medication compliance and effectiveness  -- Discharge Goals: Return home with outpatient referrals for mental health follow-up including medication management/psychotherapy   Harlin Heys, DO 10/26/2022, 10:30 AM  Total Time Spent in Direct Patient Care:  I personally spent 25 minutes  on the unit in direct patient care. The direct patient care time included face-to-face time with the patient, reviewing the patient's chart, communicating with other professionals, and coordinating care. Greater than 50% of this time was spent in counseling or coordinating care with the patient regarding goals of hospitalization, psycho-education, and discharge planning needs.

## 2022-10-26 NOTE — Progress Notes (Signed)
   10/26/22 0900  Psych Admission Type (Psych Patients Only)  Admission Status Involuntary  Psychosocial Assessment  Patient Complaints Anxiety;Depression  Eye Contact Fair  Facial Expression Anxious  Affect Appropriate to circumstance  Speech Logical/coherent  Interaction Assertive  Motor Activity Fidgety  Appearance/Hygiene Unremarkable  Behavior Characteristics Cooperative;Anxious  Mood Anxious  Thought Process  Coherency Circumstantial  Content Preoccupation  Delusions None reported or observed  Perception WDL  Hallucination None reported or observed  Judgment Poor  Confusion None  Danger to Self  Current suicidal ideation? Denies  Self-Injurious Behavior No self-injurious ideation or behavior indicators observed or expressed   Agreement Not to Harm Self Yes  Description of Agreement verbal  Danger to Others  Danger to Others None reported or observed  Danger to Others Abnormal  Harmful Behavior to others No threats or harm toward other people

## 2022-10-26 NOTE — [facility] Group Notes (Signed)
Adult Psychoeducational Group Note  Date:  10/26/2022 Time:  8:57 PM  Group Topic/Focus:  Wrap-Up Group:   The focus of this group is to help patients review their daily goal of treatment and discuss progress on daily workbooks.  Participation Level:  Active  Participation Quality:  Attentive  Affect:  Appropriate  Cognitive:  Alert  Insight: Improving  Engagement in Group:  Engaged and Supportive  Modes of Intervention:  Discussion and Support  Additional Comments:  Pt attended and participated in group.  [name] Crandall Cassandra 10/26/2022, 8:57 PM

## 2022-10-26 NOTE — [facility] Counselor (Signed)
  10/26/2022  10:52 AM   [name]  Type of note: ACTT Referral   CSW sent ACTT referrals to [facility]. CSW will continue to follow-up with referrals.   Signed:  [name] MSW, [facility] 10/26/2022  10:52 AM

## 2022-10-26 NOTE — [facility] Counselor (Signed)
  10/26/2022  11:12 AM   [name]  Type of note: ACTT referral for [facility]   CSW called to follow-up on referral and was advised that there is a 4-6 weeks waiting list and they would reach out when they get to Pt on list. Please call them and advise if Pt is discharged before.   Signed:  [name] MSW, LCSWA 10/26/2022  11:12 AM

## 2022-10-26 NOTE — Plan of Care (Signed)
  Problem: Activity: Goal: Ability to tolerate increased activity will improve Outcome: Progressing   Problem: Respiratory: Goal: Ability to maintain a clear airway and adequate ventilation will improve Outcome: Progressing   Problem: Role Relationship: Goal: Method of communication will improve Outcome: Progressing   Problem: Education: Goal: Knowledge of [place] [facility] Education information/materials will improve Outcome: Progressing

## 2022-10-26 NOTE — Progress Notes (Signed)
   10/25/22 2100  Psych Admission Type (Psych Patients Only)  Admission Status Involuntary  Psychosocial Assessment  Patient Complaints Anxiety  Eye Contact Fair  Facial Expression Animated  Affect Appropriate to circumstance  Speech Rapid  Interaction Assertive  Motor Activity Fidgety  Appearance/Hygiene Unremarkable  Behavior Characteristics Cooperative;Appropriate to situation  Mood Preoccupied  Thought Process  Coherency Circumstantial  Content Preoccupation  Delusions None reported or observed  Perception WDL  Hallucination None reported or observed  Judgment Poor  Confusion None  Danger to Self  Current suicidal ideation? Denies  Self-Injurious Behavior No self-injurious ideation or behavior indicators observed or expressed   Agreement Not to Harm Self Yes  Description of Agreement verbal

## 2022-10-26 NOTE — Group Note (Signed)
Recreation Therapy Group Note   Group Topic:Problem Solving  Group Date: 10/26/2022 Start Time: 1008 End Time: 1025 Facilitators: Adeli Frost-McCall, LRT,CTRS Location: [address] Dayroom   Group Topic: Communication, Team Building, Problem Solving  Goal Area(s) Addresses:  Patient will effectively work with peer towards shared goal.  Patient will identify skills used to make activity successful.  Patient will identify how skills used during activity can be used to reach post d/c goals.   Intervention: STEM Activity  Group Description: Stage manager. In teams of 3-5, patients were given 12 plastic drinking straws and an equal length of masking tape. Using the materials provided, patients were asked to build a landing pad to catch a golf ball dropped from approximately 5 feet in the air. All materials were required to be used by the team in their design. LRT facilitated post-activity discussion.  Education: Pharmacist, community, Scientist, physiological, Discharge Planning   Education Outcome: Acknowledges education/In group clarification offered/Needs additional education.    Affect/Mood: N/A   Participation Level: Did not attend    Clinical Observations/Individualized Feedback:     Plan: Continue to engage patient in RT group sessions 2-3x/week.   Rebeckah Masih-McCall, LRT,CTRS  10/26/2022 12:30 PM

## 2022-10-26 NOTE — [facility] Counselor (Signed)
  10/26/2022  2:46 PM   [name]  Type of note: ACTT with [facility]   Pt has been accepted for ACTT with [facility] and has been assigned to [employer] [phone]. [name] will call Monday and advise when she can come see Pt and meet her.   Signed:  [name] MSW, [facility] 10/26/2022  2:46 PM

## 2022-10-26 NOTE — BH IP Treatment Plan (Signed)
Interdisciplinary Treatment and Diagnostic Plan Update  10/26/2022 Time of Session: 9:20 AM ( update)  [name] MRN: [id number]  Principal Diagnosis: Schizoaffective disorder, bipolar type (HCC)  Secondary Diagnoses: Principal Problem:   Schizoaffective disorder, bipolar type (HCC) Active Problems:   Borderline personality disorder (HCC)   PTSD (post-traumatic stress disorder)   GAD (generalized anxiety disorder)   Current Medications:  Current Facility-Administered Medications  Medication Dose Route Frequency Provider Last Rate Last Admin   albuterol (VENTOLIN HFA) 108 (90 Base) MCG/ACT inhaler 2 puff  2 puff Inhalation Q6H PRN Mariel Craft, MD   2 puff at 10/24/22 1315   alum & mag hydroxide-simeth (MAALOX/MYLANTA) 200-200-20 MG/5ML suspension 30 mL  30 mL Oral Q4H PRN Mariel Craft, MD       ARIPiprazole (ABILIFY) tablet 2 mg  2 mg Oral Daily Mariel Craft, MD   2 mg at 10/26/22 0753   cholecalciferol (VITAMIN D3) 10 MCG (400 UNIT) tablet 400 Units  400 Units Oral Daily Mariel Craft, MD   400 Units at 10/26/22 0753   cyclobenzaprine (FLEXERIL) tablet 10 mg  10 mg Oral QHS Mariel Craft, MD   10 mg at 10/25/22 2127   diclofenac Sodium (VOLTAREN) 1 % topical gel 2 g  2 g Topical QID Starleen Blue, NP   2 g at 10/26/22 1130   DULoxetine (CYMBALTA) DR capsule 60 mg  60 mg Oral Daily Mariel Craft, MD   60 mg at 10/26/22 0753   ferrous sulfate tablet 325 mg  325 mg Oral BID WC Mariel Craft, MD   325 mg at 10/26/22 0753   gabapentin (NEURONTIN) capsule 600 mg  600 mg Oral TID Mariel Craft, MD   600 mg at 10/26/22 1130   hydrOXYzine (ATARAX) tablet 25 mg  25 mg Oral TID PRN Mariel Craft, MD   25 mg at 10/24/22 2106   ibuprofen (ADVIL) tablet 600 mg  600 mg Oral TID PRN Mariel Craft, MD   600 mg at 10/25/22 1704   magnesium oxide (MAG-OX) tablet 400 mg  400 mg Oral BID Mariel Craft, MD   400 mg at 10/26/22 0753   metoprolol tartrate  (LOPRESSOR) tablet 25 mg  25 mg Oral Q12H Massengill, Harrold Donath, MD   25 mg at 10/26/22 0753   mometasone-formoterol (DULERA) 200-5 MCG/ACT inhaler 2 puff  2 puff Inhalation BID Mariel Craft, MD   2 puff at 10/26/22 0754   OLANZapine (ZYPREXA) tablet 5 mg  5 mg Oral BID PRN Starleen Blue, NP   5 mg at 10/25/22 1022   Or   OLANZapine (ZYPREXA) injection 5 mg  5 mg Intramuscular BID PRN Starleen Blue, NP       OLANZapine (ZYPREXA) tablet 15 mg  15 mg Oral Q24H Hill, Shelbie Hutching, MD   15 mg at 10/25/22 2127   OLANZapine zydis (ZYPREXA) disintegrating tablet 5 mg  5 mg Oral BID AC Mariel Craft, MD   5 mg at 10/26/22 1130   ondansetron (ZOFRAN-ODT) disintegrating tablet 4 mg  4 mg Oral Q8H PRN Mariel Craft, MD       pantoprazole (PROTONIX) EC tablet 40 mg  40 mg Oral QAC breakfast Mariel Craft, MD   40 mg at 10/26/22 0647   polyethylene glycol (MIRALAX / GLYCOLAX) packet 17 g  17 g Oral BID PRN Mariel Craft, MD   17 g at 10/24/22 1613   prazosin (MINIPRESS) capsule  1 mg  1 mg Oral QHS Mariel Craft, MD   1 mg at 10/24/22 2105   senna-docusate (Senokot-S) tablet 1 tablet  1 tablet Oral BID PRN Mariel Craft, MD   1 tablet at 10/24/22 1613   sodium bicarbonate tablet 650 mg  650 mg Oral Daily Mariel Craft, MD   650 mg at 10/26/22 4098   PTA Medications: Medications Prior to Admission  Medication Sig Dispense Refill Last Dose   albuterol (VENTOLIN HFA) 108 (90 Base) MCG/ACT inhaler Inhale 2 puffs into the lungs every 6 (six) hours as needed for wheezing or shortness of breath.      ARIPiprazole (ABILIFY) 2 MG tablet Take 1 tablet (2 mg total) by mouth daily.      cholecalciferol (VITAMIN D3) 10 MCG (400 UNIT) TABS tablet Take 1 tablet (400 Units total) by mouth daily.      cyclobenzaprine (FLEXERIL) 10 MG tablet Take 10 mg by mouth at bedtime.      diclofenac Sodium (VOLTAREN) 1 % GEL Apply 2 g topically 4 (four) times daily. (Patient taking differently: Apply 2 g  topically 4 (four) times daily as needed.)      DULoxetine (CYMBALTA) 60 MG capsule Take 1 capsule (60 mg total) by mouth daily.      ferrous sulfate 325 (65 FE) MG tablet Take 1 tablet (325 mg total) by mouth 2 (two) times daily with a meal.      gabapentin (NEURONTIN) 600 MG tablet Take 600 mg by mouth 3 (three) times daily.      hydrOXYzine (ATARAX) 25 MG tablet Take 1 tablet (25 mg total) by mouth 3 (three) times daily as needed for anxiety. 30 tablet 0    ibuprofen (ADVIL) 600 MG tablet Take 1 tablet (600 mg total) by mouth 3 (three) times daily as needed for mild pain or moderate pain.      LORazepam (ATIVAN) 1 MG tablet Take 1 tablet (1 mg total) by mouth 2 (two) times daily as needed for anxiety (agitation).      magnesium oxide (MAG-OX) 400 (240 Mg) MG tablet Take 1 tablet (400 mg total) by mouth 2 (two) times daily.      metoprolol tartrate (LOPRESSOR) 25 MG tablet Take 0.5 tablets (12.5 mg total) by mouth 2 (two) times daily.      mometasone-formoterol (DULERA) 200-5 MCG/ACT AERO Inhale 2 puffs into the lungs 2 (two) times daily.      OLANZapine (ZYPREXA) 15 MG tablet Take 1 tablet (15 mg total) by mouth every evening. (Patient taking differently: Take 15 mg by mouth every evening. Takes at 8p)      OLANZapine zydis (ZYPREXA) 5 MG disintegrating tablet Take 1 tablet (5 mg total) by mouth 2 (two) times daily before lunch and supper.      ondansetron (ZOFRAN-ODT) 4 MG disintegrating tablet Take 1 tablet (4 mg total) by mouth every 8 (eight) hours as needed for nausea or vomiting.      pantoprazole (PROTONIX) 40 MG tablet Take 40 mg by mouth daily before breakfast.      polyethylene glycol (MIRALAX / GLYCOLAX) 17 g packet Take 17 g by mouth 2 (two) times daily as needed for mild constipation.      prazosin (MINIPRESS) 1 MG capsule Take 1 mg by mouth at bedtime.      senna-docusate (SENOKOT-S) 8.6-50 MG tablet Take 1 tablet by mouth 2 (two) times daily as needed for moderate constipation.  (Patient taking differently: Take 1 tablet  by mouth daily.)      sodium bicarbonate 650 MG tablet Take 1 tablet (650 mg total) by mouth daily.       Patient Stressors: Medication change or noncompliance   Occupational concerns    Patient Strengths: Capable of independent living  General fund of knowledge  Supportive family/friends   Treatment Modalities: Medication Management, Group therapy, Case management,  1 to 1 session with clinician, Psychoeducation, Recreational therapy.   Physician Treatment Plan for Primary Diagnosis: Schizoaffective disorder, bipolar type (HCC) Long Term Goal(s): Improvement in symptoms so as ready for discharge   Short Term Goals: Ability to verbalize feelings will improve Ability to disclose and discuss suicidal ideas Ability to demonstrate self-control will improve Ability to identify and develop effective coping behaviors will improve Compliance with prescribed medications will improve  Medication Management: Evaluate patient's response, side effects, and tolerance of medication regimen.  Therapeutic Interventions: 1 to 1 sessions, Unit Group sessions and Medication administration.  Evaluation of Outcomes: Progressing  Physician Treatment Plan for Secondary Diagnosis: Principal Problem:   Schizoaffective disorder, bipolar type (HCC) Active Problems:   Borderline personality disorder (HCC)   PTSD (post-traumatic stress disorder)   GAD (generalized anxiety disorder)  Long Term Goal(s): Improvement in symptoms so as ready for discharge   Short Term Goals: Ability to verbalize feelings will improve Ability to disclose and discuss suicidal ideas Ability to demonstrate self-control will improve Ability to identify and develop effective coping behaviors will improve Compliance with prescribed medications will improve     Medication Management: Evaluate patient's response, side effects, and tolerance of medication regimen.  Therapeutic  Interventions: 1 to 1 sessions, Unit Group sessions and Medication administration.  Evaluation of Outcomes: Progressing   RN Treatment Plan for Primary Diagnosis: Schizoaffective disorder, bipolar type (HCC) Long Term Goal(s): Knowledge of disease and therapeutic regimen to maintain health will improve  Short Term Goals: Ability to remain free from injury will improve, Ability to verbalize frustration and anger appropriately will improve, Ability to participate in decision making will improve, Ability to verbalize feelings will improve, Ability to identify and develop effective coping behaviors will improve, and Compliance with prescribed medications will improve  Medication Management: RN will administer medications as ordered by provider, will assess and evaluate patient's response and provide education to patient for prescribed medication. RN will report any adverse and/or side effects to prescribing provider.  Therapeutic Interventions: 1 on 1 counseling sessions, Psychoeducation, Medication administration, Evaluate responses to treatment, Monitor vital signs and CBGs as ordered, Perform/monitor CIWA, COWS, AIMS and Fall Risk screenings as ordered, Perform wound care treatments as ordered.  Evaluation of Outcomes: Progressing   LCSW Treatment Plan for Primary Diagnosis: Schizoaffective disorder, bipolar type (HCC) Long Term Goal(s): Safe transition to appropriate next level of care at discharge, Engage patient in therapeutic group addressing interpersonal concerns.  Short Term Goals: Engage patient in aftercare planning with referrals and resources, Increase social support, Increase emotional regulation, Facilitate acceptance of mental health diagnosis and concerns, Identify triggers associated with mental health/substance abuse issues, and Increase skills for wellness and recovery  Therapeutic Interventions: Assess for all discharge needs, 1 to 1 time with Social worker, Explore available  resources and support systems, Assess for adequacy in community support network, Educate family and significant other(s) on suicide prevention, Complete Psychosocial Assessment, Interpersonal group therapy.  Evaluation of Outcomes: Progressing   Progress in Treatment: Attending groups: Yes. Participating in groups: Yes. Taking medication as prescribed: Yes. Toleration medication: Yes. Family/Significant other contact made: Yes;friend  Rosalia Hammers [phone]                             Lupita Leash at [facility] (CST lead) [phone]    Patient understands diagnosis: Yes. Discussing patient identified problems/goals with staff: Yes. Medical problems stabilized or resolved: Yes. Denies suicidal/homicidal ideation: Yes. Issues/concerns per patient self-inventory: No.   New problem(s) identified: No, Describe:  none reported   New Short Term/Long Term Goal(s):medication stabilization, elimination of SI thoughts, development of comprehensive mental wellness plan.      Patient Goals:  "to get back in with my CST Team"   Discharge Plan or Barriers: Patient recently admitted. CSW will continue to follow and assess for appropriate referrals and possible discharge planning.      Reason for Continuation of Hospitalization: Depression Medication stabilization Suicidal ideation   Estimated Length of Stay:3-4 days Pt is in need of a strong Safety Plan pertaining to her medications being monitored.    Last 3 [place] Suicide Severity Risk Score: Flowsheet Row ED to Hosp-Admission (Current) [facility] Hosp-Admission (Discharged) from 08/22/2022 in [facility] from 08/20/2022 in [facility]  C-SSRS RISK CATEGORY High Risk High Risk High Risk       Last [facility] 2/9 Scores:    03/10/2022   10:01 PM 03/10/2022    9:56 PM 02/10/2022    1:49 AM  Depression screen PHQ 2/9   Decreased Interest 1 1 2   Down, Depressed, Hopeless 1 1 2   PHQ - 2 Score 2 2 4   Altered sleeping 1 1 2   Tired, decreased energy 1 1 2   Change in appetite 1 0 2  Feeling bad or failure about yourself  1 1 2   Trouble concentrating 1 1 2   Moving slowly or fidgety/restless 1 1 1   Suicidal thoughts 1 1 1   PHQ-9 Score 9 8 16   Difficult doing work/chores Somewhat difficult  Very difficult    Scribe for Treatment Team: Beather Arbour 10/26/2022 11:53 AM

## 2022-10-27 NOTE — Progress Notes (Signed)
Pt continues to complain of AH- voices of her parents and voices that were telling her to bang her head against the wall. Pt reported that she was getting agitated by the voices, and needed a prn for some relief. Pt also complained of tooth pain, rated 6/10.  Pt given prn zyprexa and ibuprofen for relief. Pt did verbally contract for safety, stating, "I wouldn't want to do anything to mess up my discharge.

## 2022-10-27 NOTE — Progress Notes (Signed)
Patient alert and oriented x 4. Patient cooperative with care. She attended unit group and socializing with peers and staff. Patient rated her anxiety 5/10 and depression 2/10. Patient denies any pain, denies current SI, HI, & AVH when asked, but endorses "I hear voices in the afternoon telling me to kill myself, but not now." Patient verbally contract for safety. Support provided, no physical symptoms reported. Patient compliant with medications as scheduled without any side effects. Patient maintained safe in and out of the unit. Q 15 minutes safety checks maintained.    10/26/22 2100  Psych Admission Type (Psych Patients Only)  Admission Status Involuntary  Psychosocial Assessment  Patient Complaints Anxiety;Depression  Eye Contact Fair  Facial Expression Anxious  Affect Appropriate to circumstance  Speech Logical/coherent  Interaction Assertive  Motor Activity Fidgety  Appearance/Hygiene Unremarkable  Behavior Characteristics Cooperative;Appropriate to situation  Mood Anxious  Thought Process  Coherency Circumstantial  Content Preoccupation  Delusions None reported or observed  Perception WDL  Hallucination None reported or observed  Judgment Poor  Confusion None  Danger to Self  Current suicidal ideation? Denies  Self-Injurious Behavior No self-injurious ideation or behavior indicators observed or expressed   Agreement Not to Harm Self Yes  Description of Agreement verbal  Danger to Others  Danger to Others None reported or observed  Danger to Others Abnormal  Harmful Behavior to others No threats or harm toward other people

## 2022-10-27 NOTE — [facility] Group Notes (Signed)
LCSW Wellness Group Note     10/27/2022 10:00am   Type of Group and Topic: Psychoeducational Group:  Wellness   Participation Level:  did not attend   Description of Group   Wellness group introduces the topic and its focus on developing healthy habits across the spectrum and its relationship to a decrease in hospital admissions.  Six areas of wellness are discussed: physical, social spiritual, intellectual, occupational, and emotional.  Patients are asked to consider their current wellness habits and to identify areas of wellness where they are interested and able to focus on improvements.     Therapeutic Goals Patients will understand components of wellness and how they can positively impact overall health.  Patients will identify areas of wellness where they have developed good habits. Patients will identify areas of wellness where they would like to make improvements.      Summary of Patient Progress         Therapeutic Modalities: Cognitive Behavioral Therapy Psychoeducation       Lorri Frederick, LCSW

## 2022-10-27 NOTE — Progress Notes (Signed)
D. Pt presented calm and cooperative, but appeared a little sleepy this am despite having slept well last pm. Pt complained of AH this morning after group, and requested noon meds (Zyprexa) a little early.  Pt currently denies SI/HI and  and agrees to contact staff before acting on any harmful thoughts.  A. Labs and vitals monitored. Pt given and educated on medications. Pt supported emotionally and encouraged to express concerns and ask questions.   R. Pt remains safe with 15 minute checks. Will continue POC.

## 2022-10-27 NOTE — Plan of Care (Signed)
Problem: Coping: Goal: Ability to verbalize frustrations and anger appropriately will improve Outcome: Progressing Goal: Ability to demonstrate self-control will improve Outcome: Progressing

## 2022-10-27 NOTE — Progress Notes (Signed)
[facility] MD Progress Note  10/27/2022 9:33 AM [name]  MRN:  [id number]  Reason for admission   [name] is a 32 y.o. female with a past history significant for schizoaffective-bipolar type, GAD, PTSD, carrier of fragile x syndrome, ASD v asperger syndrome, borderline personality, NSSIB (cutting), fibromyalgia, multiple suicide attempt, multiple inpt psych admission, who initially presented to [facility] ED 08/22/22 via EMS after ingesting multiple medications (#30 metoprolol 25mg  tablets, #60 Hydroxyzine 25mg , #7 Lexapro 20mg , #21 Gabapentin 600mg , #7 Flexeril 10mg , #7 protonix 40mg  and #7 Prazosin 1mg ) in a suicide attempt. She was placed under IVC and admitted to the ICU where she was intubated; later extubated 08/23/22 then transferred to medical unit where she remained until 10/19/22 when she was transferred to [facility] (26th admission).   Chart review from last 24 hours    Staff reports the patient has been alert oriented and cooperative.  She has denied any symptoms and has slept 7.75 hours.  She is compliant with all her medications.  She reports no side effects.   Yesterday, the psychiatry team made the following recommendations:  The plan is for the ACT team to see her on Monday and after their assessment will be probably discharged.    Information obtained during interview   The patient was seen today and the chart was reviewed.  She was alert oriented and cooperative.  She was able to maintain fair eye contact.  Her speech was coherent without any obvious looseness of associations, flight of ideas or tangentiality.  She reports that she is awaiting for the ACT team to come on Monday and do their assessment so she can be discharged.  She lives alone in her apartment and takes care of her own finances.  She has been medication compliant.  She denies any active SI/HI/AVH.   Principal Problem: Schizoaffective disorder, bipolar type (HCC) Diagnosis: Principal Problem:    Schizoaffective disorder, bipolar type (HCC) Active Problems:   Borderline personality disorder (HCC)   PTSD (post-traumatic stress disorder)   GAD (generalized anxiety disorder)  Total Time spent with patient: 20 minutes  Past Psychiatric History:  Previous Psych Diagnoses: Schizoaffective disorder bipolar type, Anxiety, Autism spectrum disorder, PTSD, Borderline personality disorder Prior inpatient treatment: multiple Current/prior outpatient treatment: [facility]  Prior rehab hx: denies multiple Psychotherapy hx: [facility] History of suicide: 20-30 times by overdosing on pills, cutting or drinking 409 disinfectant History of homicide or aggression: denies Psychiatric medication history:  Previously trialed Tegretol, Haldol, Clozaril, Depakote, Invega, Lamictal, Seroquel, asenapine, abilify, vraylar, Lexapro, Zoloft  Neuromodulation history: denies  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    Chronic constipation    Depression    Drug-seeking behavior    Essential tremor    Headache    Ineffective individual coping 05/16/2022   Insomnia 01/12/2022   Intentional drug overdose (HCC) 06/05/2022   Neuromuscular disorder (HCC)    Normocytic anemia 06/05/2022   Overdose 07/22/2017   Overdose of acetaminophen 07/2017   and other meds   Overdose, intentional self-harm, initial encounter (HCC) 07/20/2021   Paranoia (HCC) 04/22/2021   Personality  disorder (HCC)    Purposeful non-suicidal drug ingestion (HCC) 06/27/2021   Schizo-affective psychosis (HCC)    Schizoaffective disorder (HCC) 07/29/2022   Schizoaffective disorder, bipolar type  (HCC)    Seizures (HCC)    Last seizure December 2017   Skin erythema 04/27/2022   Sleep apnea    Suicidal behavior 07/25/2021   Suicidal ideation    Suicide (HCC) 07/01/2021   Suicide attempt (HCC) 07/04/2021    Past Surgical History:  Procedure Laterality Date   MOUTH SURGERY  2009 or 2010   Family History:  Family History  Problem Relation Age of Onset   Mental illness Father    Asthma Father    PDD Brother    Seizures Brother    Family Psychiatric  History: See H&P   Social History:  Social History   Substance and Sexual Activity  Alcohol Use No   Alcohol/week: 1.0 standard drink of alcohol   Types: 1 Standard drinks or equivalent per week   Comment: denies at this time     Social History   Substance and Sexual Activity  Drug Use No   Comment: History of cocaine use at age 74 for 4 months    Social History   Socioeconomic History   Marital status: Widowed    Spouse name: Not on file   Number of children: 0   Years of education: Not on file   Highest education level: Not on file  Occupational History   Occupation: disability  Tobacco Use   Smoking status: Former    Types: Cigarettes   Smokeless tobacco: Never   Tobacco comments:    Smoked for 2  years age [age]  Vaping Use   Vaping status: Never Used  Substance and Sexual Activity   Alcohol use: No    Alcohol/week: 1.0 standard drink of alcohol    Types: 1 Standard drinks or equivalent per week    Comment: denies at this time   Drug use: No    Comment: History of cocaine use at age 48 for 4 months   Sexual activity: Not Currently    Birth control/protection: None  Other Topics Concern   Not on file  Social History Narrative   Marital status: Widowed      Children: daughter      Lives: with boyfriend, in two story home      Employment:  Disability      Tobacco: quit smoking; smoked for two years.      Alcohol ;none      Drugs: none   Has not traveled outside of the country.   Right handed          Patient with hx of Fibromyalgia,Orthostatic Tachycardia,Asthma,Arthritis,Gerd, ASD   Social Determinants of Health   Financial Resource Strain: Not on file  Food Insecurity: No Food Insecurity (10/19/2022)   Hunger Vital Sign    Worried About Running Out of Food in the Last Year: Never true    Ran Out of Food in the Last Year: Never true  Transportation Needs: No Transportation Needs (10/19/2022)   PRAPARE - Administrator, Civil Service (Medical): No    Lack of Transportation (Non-Medical): No  Physical Activity: Not on file  Stress: Not on file  Social Connections: Not on file   Additional Social History:                          Current Medications: Current Facility-Administered Medications  Medication Dose Route Frequency Provider Last Rate Last Admin   albuterol (VENTOLIN HFA) 108 (90 Base) MCG/ACT inhaler 2 puff  2 puff Inhalation Q6H PRN Mariel Craft, MD   2 puff at 10/24/22 1315   alum & mag hydroxide-simeth (MAALOX/MYLANTA) 200-200-20 MG/5ML suspension 30 mL  30 mL Oral Q4H PRN Mariel Craft, MD       ARIPiprazole (ABILIFY) tablet 2 mg  2 mg Oral Daily Mariel Craft, MD   2 mg at 10/27/22 0750   cholecalciferol (VITAMIN D3) 10 MCG (400 UNIT) tablet 400 Units  400 Units Oral Daily Mariel Craft, MD   400 Units at 10/27/22 0750   cyclobenzaprine (FLEXERIL) tablet 10 mg  10 mg Oral QHS Mariel Craft, MD   10 mg at 10/26/22 2138   diclofenac Sodium (VOLTAREN) 1 % topical gel 2 g  2 g Topical QID Starleen Blue, NP   2 g at 10/27/22 0752   DULoxetine (CYMBALTA) DR capsule 60 mg  60 mg Oral Daily Mariel Craft, MD   60 mg at 10/27/22 0750   ferrous sulfate tablet 325 mg  325 mg Oral BID WC Mariel Craft, MD   325 mg at 10/27/22 0750   gabapentin (NEURONTIN) capsule 600 mg  600 mg Oral TID Mariel Craft, MD   600 mg at 10/27/22 0749   hydrOXYzine (ATARAX) tablet 25 mg  25 mg Oral TID PRN Mariel Craft, MD   25 mg at 10/26/22  1520   ibuprofen (ADVIL) tablet 600 mg  600 mg Oral TID PRN Mariel Craft, MD   600 mg at 10/26/22 1549   magnesium oxide (MAG-OX) tablet 400 mg  400 mg Oral BID Mariel Craft, MD   400 mg at 10/27/22 0750   metoprolol tartrate (LOPRESSOR) tablet 25 mg  25 mg Oral Q12H Massengill, Harrold Donath, MD   25 mg at 10/27/22 0750   mometasone-formoterol (DULERA) 200-5 MCG/ACT inhaler 2 puff  2 puff Inhalation BID Mariel Craft, MD   2 puff at 10/27/22 0749   OLANZapine (ZYPREXA) tablet 5 mg  5 mg Oral BID PRN Starleen Blue, NP   5 mg at 10/25/22 1022   Or   OLANZapine (ZYPREXA) injection 5 mg  5 mg Intramuscular BID PRN Starleen Blue, NP       OLANZapine (ZYPREXA) tablet 15 mg  15 mg Oral Q24H Hill, Shelbie Hutching, MD   15 mg at 10/26/22 2137   OLANZapine zydis (ZYPREXA) disintegrating tablet 5 mg  5 mg Oral BID AC Mariel Craft, MD   5 mg at 10/26/22 1604   ondansetron (ZOFRAN-ODT) disintegrating tablet 4 mg  4 mg Oral Q8H PRN Mariel Craft, MD       pantoprazole (PROTONIX) EC tablet 40 mg  40 mg Oral QAC breakfast Mariel Craft, MD   40 mg at 10/27/22 4098   polyethylene glycol (MIRALAX / GLYCOLAX) packet 17 g  17 g Oral BID PRN Mariel Craft, MD   17 g at 10/27/22 0749   prazosin (MINIPRESS) capsule 1 mg  1 mg Oral QHS Mariel Craft, MD   1 mg at 10/26/22 2137   senna-docusate (Senokot-S) tablet 1 tablet  1 tablet Oral BID PRN Mariel Craft, MD   1 tablet at 10/27/22 0749   sodium bicarbonate tablet 650 mg  650 mg Oral Daily Mariel Craft, MD   650 mg at 10/27/22 0749    Lab Results:  No results found for this or any previous visit (from the past 48 hour(s)).  Blood Alcohol level:  Lab Results  Component Value Date   ETH <10 08/22/2022   ETH <10 08/09/2022    Metabolic Disorder Labs: Lab Results  Component Value Date   HGBA1C 5.4 10/22/2022   MPG 108 10/22/2022   MPG 103 04/20/2022   Lab Results  Component Value Date   PROLACTIN 66.2 (H) 03/02/2021    PROLACTIN 66.1 (H) 02/04/2020   Lab Results  Component Value Date   CHOL 192 10/22/2022   TRIG 143 10/22/2022   HDL 46 10/22/2022   CHOLHDL 4.2 10/22/2022   VLDL 29 10/22/2022   LDLCALC 117 (H) 10/22/2022   LDLCALC 106 (H) 04/20/2022    Physical Findings: AIMS:  , ,  ,  ,    CIWA:    COWS:     Musculoskeletal: Strength & Muscle Tone: within normal limits Gait & Station: normal Patient leans: N/A  Psychiatric Specialty Exam:  Presentation  General Appearance:  Appropriate for Environment  Eye Contact: Fair  Speech: Clear and Coherent  Speech Volume: Decreased  Handedness: Right   Mood and Affect  Mood: Euthymic  Affect: Blunt; Restricted   Thought Process  Thought Processes: Coherent  Descriptions of Associations:Intact  Orientation:Full (Time, Place and Person)  Thought Content:Perseveration  History of Schizophrenia/Schizoaffective disorder:Yes  Duration of Psychotic Symptoms:Greater than six months  Hallucinations:Hallucinations: None  Ideas of Reference:None  Suicidal Thoughts:Suicidal Thoughts: No  Homicidal Thoughts:Homicidal Thoughts: No   Sensorium  Memory: Immediate Fair; Remote Fair; Recent Fair  Judgment: Fair  Insight: Fair   Art therapist  Concentration: Fair  Attention Span: Fair  Recall: [employer] of Knowledge: Fair  Language: Fair   Psychomotor Activity  Psychomotor Activity: Psychomotor Activity: Normal   Assets  Assets: Communication Skills; Desire for Improvement; Resilience   Sleep  Sleep: Sleep: Good Number of Hours of Sleep: 7.75    Physical Exam: Physical Exam Vitals reviewed.  Pulmonary:     Effort: Pulmonary effort is normal.  Neurological:     Mental Status: She is alert.     Motor: No weakness.     Gait: Gait normal.    Review of Systems  Constitutional:  Negative for chills and fever.  Cardiovascular:  Negative for chest pain and palpitations.   Neurological:  Negative for dizziness, tingling, tremors and headaches.  Psychiatric/Behavioral:  Negative for depression, hallucinations, memory loss, substance abuse and suicidal ideas. The patient is nervous/anxious. The patient does not have insomnia.   All other systems reviewed and are negative.  Blood pressure (!) 123/47, pulse (!) 123, temperature (!) 97.3 F (36.3 C), temperature source Oral, resp. rate 18, height 5\' 6"  (1.676 m), weight 107 kg, last menstrual period 08/08/2022, SpO2 96%. Body mass index is 38.09 kg/m.   Treatment Plan Summary:  ASSESSMENT:  Diagnoses / Active Problems: Schizoaffective d/o BP type GAD PTSD Borderline PD   PLAN: Safety and Monitoring:  -- Involuntary admission to inpatient psychiatric unit for safety, stabilization and treatment  -- Daily contact with patient to assess and evaluate symptoms and progress in treatment  -- Patient's case to be discussed in multi-disciplinary team meeting  -- Observation Level : q15 minute checks  -- Vital signs:  q12 hours  -- Precautions: suicide, elopement, and assault  2. Psychiatric Diagnoses and Treatment:   Continue Abilify 2 mg PO daily  Continue Olanzapine 5 mg BID before lunch and supper Continue Olanzapine 15 mg  PO every night Continue Cymbalta DR 60 mg PO daily Continue Prazosin 1 mg daily HS  Continue Vitamin D3 400 units PO daily  Continue Cyclobenzapine 10 mg PO daily HS Continue Gabapentin 600 mg TID   --  The risks/benefits/side-effects/alternatives to this medication were discussed in detail with the patient and time was given for questions. The patient consents to medication trial.    -- Metabolic profile and EKG monitoring obtained while on an atypical antipsychotic (BMI: Lipid Panel: HbgA1c: QTc:)   -- Encouraged patient to participate in unit milieu and in scheduled group therapies     3. Medical Issues Being Addressed:  HTN on metoprolol 25 mg bid  Anemia on iron  Gerd on  protonix    4. Discharge Planning:   -- Social work and case management to assist with discharge planning and identification of hospital follow-up needs prior to discharge  -- Estimated LOS: Possible discharge on Monday after being assessed by the ACT team.  -- Discharge Concerns: Need to establish a safety plan; Medication compliance and effectiveness  -- Discharge Goals: Return home with outpatient referrals for mental health follow-up including medication management/psychotherapy   Rex Kras, MD 10/27/2022, 9:34 AM  Total Time Spent in Direct Patient Care:  I personally spent 25 minutes on the unit in direct patient care. The direct patient care time included face-to-face time with the patient, reviewing the patient's chart, communicating with other professionals, and coordinating care. Greater than 50% of this time was spent in counseling or coordinating care with the patient regarding goals of hospitalization, psycho-education, and discharge planning needs.   Patient ID: [name], female   DOB: 11/28/1990, 32 y.o.   MRN: [id number]

## 2022-10-27 NOTE — [facility] Group Notes (Signed)
[facility] Group Notes:  (Nursing/MHT/Case Management/Adjunct)  Date:  10/27/2022  Time:  8:40 PM  Type of Therapy:  The focus of this group is to help patients review their daily goal of treatment and discuss progress on daily workbooks.    Participation Level:  Active  Participation Quality:  Appropriate  Affect:  Appropriate  Cognitive:  Appropriate  Insight:  Appropriate  Engagement in Group:  Supportive  Modes of Intervention:  Socialization  Summary of Progress/Problems: Pt attended group  [name] 10/27/2022, 8:40 PM

## 2022-10-27 NOTE — Plan of Care (Signed)
  Problem: Activity: Goal: Ability to tolerate increased activity will improve Outcome: Progressing   Problem: Respiratory: Goal: Ability to maintain a clear airway and adequate ventilation will improve Outcome: Progressing   Problem: Education: Goal: Knowledge of [place] [facility] Education information/materials will improve Outcome: Progressing   Problem: Coping: Goal: Ability to verbalize frustrations and anger appropriately will improve Outcome: Progressing   Problem: Safety: Goal: Periods of time without injury will increase Outcome: Progressing

## 2022-10-27 NOTE — Progress Notes (Signed)
   10/27/22 1000  Psych Admission Type (Psych Patients Only)  Admission Status Involuntary  Psychosocial Assessment  Patient Complaints Anxiety  Eye Contact Fair  Facial Expression Anxious  Affect Appropriate to circumstance  Speech Logical/coherent;Pressured  Interaction Assertive  Motor Activity Fidgety  Appearance/Hygiene Unremarkable  Behavior Characteristics Cooperative;Appropriate to situation;Anxious  Mood Anxious  Thought Process  Coherency Circumstantial  Content Preoccupation  Delusions None reported or observed  Perception WDL  Hallucination None reported or observed  Judgment Poor  Confusion None  Danger to Self  Current suicidal ideation? Denies  Self-Injurious Behavior No self-injurious ideation or behavior indicators observed or expressed   Agreement Not to Harm Self Yes  Description of Agreement agreed to contact staff before acting on harmful thoughts  Danger to Others  Danger to Others None reported or observed  Danger to Others Abnormal  Harmful Behavior to others No threats or harm toward other people

## 2022-10-27 NOTE — Progress Notes (Signed)
   10/27/22 0559  15 Minute Checks  Location Bedroom  Visual Appearance Calm  Behavior Sleeping  Sleep (Behavioral Health Patients Only)  Calculate sleep? (Click Yes once per 24 hr at 0600 safety check) Yes  Documented sleep last 24 hours 7.75

## 2022-10-28 MED ORDER — LORAZEPAM 1 MG PO TABS
1.0000 mg | ORAL_TABLET | Freq: Once | ORAL | Status: AC
  Administered 2022-10-28: 1 mg via ORAL
  Filled 2022-10-28: qty 1

## 2022-10-28 NOTE — [facility] Group Notes (Signed)
Pt did not attend goals group. 

## 2022-10-28 NOTE — [facility] Group Notes (Signed)
[facility] Group Notes:  (Nursing/MHT/Case Management/Adjunct)  Date:  10/28/2022  Time:  2015  Type of Therapy:   Wrap up group  Participation Level:  Active  Participation Quality:  Appropriate, Attentive, Sharing, and Supportive  Affect:  Anxious  Cognitive:  Alert  Insight:  Lacking  Engagement in Group:  Engaged  Modes of Intervention:  Clarification, Education, and Support  Summary of Progress/Problems: Positive thinking and positive change were discussed.   Marcille Buffy 10/28/2022, 10:34 PM

## 2022-10-28 NOTE — Progress Notes (Signed)
[facility] MD Progress Note  10/28/2022 1:26 PM [name]  MRN:  [id number]  Reason for admission   [name] is a 32 y.o. female with a past history significant for schizoaffective-bipolar type, GAD, PTSD, carrier of fragile x syndrome, ASD v asperger syndrome, borderline personality, NSSIB (cutting), fibromyalgia, multiple suicide attempt, multiple inpt psych admission, who initially presented to [facility] ED 08/22/22 via EMS after ingesting multiple medications (#30 metoprolol 25mg  tablets, #60 Hydroxyzine 25mg , #7 Lexapro 20mg , #21 Gabapentin 600mg , #7 Flexeril 10mg , #7 protonix 40mg  and #7 Prazosin 1mg ) in a suicide attempt. She was placed under IVC and admitted to the ICU where she was intubated; later extubated 08/23/22 then transferred to medical unit where she remained until 10/19/22 when she was transferred to [facility] (26th admission).   Chart review from last 24 hours   The patient was seen and evaluated.  Case was discussed with the staff.  According to staff the patient slept fairly well but complained of some vague hallucinations last night.  She has also been partially compliant with groups and seems to sleep a fair amount in the daytime.  Overall she slept at least 7.5 hours yesterday.  She is taking all her medications and denies side effects. Yesterday, the psychiatry team made the following recommendations:  Continue current medications with no changes.  Possibly consolidating her antipsychotics. The plan is for the ACT team to see her on Monday and after their assessment will be probably discharged.    Information obtained during interview   The patient was seen today and the chart was reviewed.  She was lying in bed somewhat withdrawn and disheveled but maintained fair eye contact.  Her speech is of low volume with some latency but no obvious looseness of association or flight of ideas.  She denies any active hallucinations today but did admit that she has some vague  voices yesterday.  She is looking forward to being discharged and following up with the ACT team.  She currently denies any SI/HI/AVH.  The patient is currently on 2 antipsychotics with only 2 mg of Abilify and a total of 25 mg of Zyprexa.  We will consider discontinuation of Abilify and see if she can be maintained on Zyprexa alone.  Principal Problem: Schizoaffective disorder, bipolar type (HCC) Diagnosis: Principal Problem:   Schizoaffective disorder, bipolar type (HCC) Active Problems:   Borderline personality disorder (HCC)   PTSD (post-traumatic stress disorder)   GAD (generalized anxiety disorder)  Total Time spent with patient: 20 minutes  Past Psychiatric History:  Previous Psych Diagnoses: Schizoaffective disorder bipolar type, Anxiety, Autism spectrum disorder, PTSD, Borderline personality disorder Prior inpatient treatment: multiple Current/prior outpatient treatment: [facility]  Prior rehab hx: denies multiple Psychotherapy hx: [facility] History of suicide: 20-30 times by overdosing on pills, cutting or drinking 409 disinfectant History of homicide or aggression: denies Psychiatric medication history:  Previously trialed Tegretol, Haldol, Clozaril, Depakote, Invega, Lamictal, Seroquel, asenapine, abilify, vraylar, Lexapro, Zoloft  Neuromodulation history: denies  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    Chronic constipation    Depression    Drug-seeking  behavior    Essential tremor    Headache    Ineffective individual coping 05/16/2022   Insomnia 01/12/2022   Intentional drug overdose (HCC) 06/05/2022   Neuromuscular  disorder (HCC)    Normocytic anemia 06/05/2022   Overdose 07/22/2017   Overdose of acetaminophen 07/2017   and other meds   Overdose, intentional self-harm, initial encounter (HCC) 07/20/2021   Paranoia (HCC) 04/22/2021   Personality disorder (HCC)    Purposeful non-suicidal drug ingestion (HCC) 06/27/2021   Schizo-affective psychosis (HCC)    Schizoaffective disorder (HCC) 07/29/2022   Schizoaffective disorder, bipolar type (HCC)    Seizures (HCC)    Last seizure December 2017   Skin erythema 04/27/2022   Sleep apnea    Suicidal behavior 07/25/2021   Suicidal ideation    Suicide (HCC) 07/01/2021   Suicide attempt (HCC) 07/04/2021    Past Surgical History:  Procedure Laterality Date   MOUTH SURGERY  2009 or 2010   Family History:  Family History  Problem Relation Age of Onset   Mental illness Father    Asthma Father    PDD Brother    Seizures Brother    Family Psychiatric  History: See H&P   Social History:  Social History   Substance and Sexual Activity  Alcohol Use No   Alcohol/week: 1.0 standard drink of alcohol   Types: 1 Standard drinks or equivalent per week   Comment: denies at this time     Social History   Substance and Sexual Activity  Drug Use No   Comment: History of cocaine use at age 51 for 4 months    Social History   Socioeconomic History   Marital status: Widowed    Spouse name: Not on file   Number of children: 0   Years of education: Not on file   Highest education level: Not on file  Occupational History   Occupation: disability  Tobacco Use   Smoking status: Former    Types: Cigarettes   Smokeless tobacco: Never   Tobacco comments:    Smoked for 2  years age [age]  Vaping Use   Vaping status: Never Used  Substance and Sexual Activity   Alcohol use: No    Alcohol/week: 1.0 standard drink of alcohol    Types: 1 Standard drinks or equivalent per week    Comment: denies at this time   Drug use: No    Comment: History of  cocaine use at age 82 for 4 months   Sexual activity: Not Currently    Birth control/protection: None  Other Topics Concern   Not on file  Social History Narrative   Marital status: Widowed      Children: daughter      Lives: with boyfriend, in two story home      Employment:  Disability      Tobacco: quit smoking; smoked for two years.      Alcohol ;none      Drugs: none   Has not traveled outside of the country.   Right handed         Patient with hx of Fibromyalgia,Orthostatic Tachycardia,Asthma,Arthritis,Gerd, ASD   Social Determinants of Health   Financial Resource Strain: Not on file  Food Insecurity: No Food Insecurity (10/19/2022)   Hunger Vital Sign    Worried About Running Out of Food in the Last Year: Never true    Ran Out of Food in the Last Year: Never true  Transportation Needs: No Transportation Needs (10/19/2022)  PRAPARE - Administrator, Civil Service (Medical): No    Lack of Transportation (Non-Medical): No  Physical Activity: Not on file  Stress: Not on file  Social Connections: Not on file   Additional Social History:                          Current Medications: Current Facility-Administered Medications  Medication Dose Route Frequency Provider Last Rate Last Admin   albuterol (VENTOLIN HFA) 108 (90 Base) MCG/ACT inhaler 2 puff  2 puff Inhalation Q6H PRN Mariel Craft, MD   2 puff at 10/24/22 1315   alum & mag hydroxide-simeth (MAALOX/MYLANTA) 200-200-20 MG/5ML suspension 30 mL  30 mL Oral Q4H PRN Mariel Craft, MD   30 mL at 10/27/22 2113   ARIPiprazole (ABILIFY) tablet 2 mg  2 mg Oral Daily Mariel Craft, MD   2 mg at 10/28/22 0737   cholecalciferol (VITAMIN D3) 10 MCG (400 UNIT) tablet 400 Units  400 Units Oral Daily Mariel Craft, MD   400 Units at 10/28/22 0737   cyclobenzaprine (FLEXERIL) tablet 10 mg  10 mg Oral QHS Mariel Craft, MD   10 mg at 10/27/22 2110   diclofenac Sodium (VOLTAREN) 1 % topical gel 2 g   2 g Topical QID Starleen Blue, NP   2 g at 10/28/22 1104   DULoxetine (CYMBALTA) DR capsule 60 mg  60 mg Oral Daily Mariel Craft, MD   60 mg at 10/28/22 0737   ferrous sulfate tablet 325 mg  325 mg Oral BID WC Mariel Craft, MD   325 mg at 10/28/22 0737   gabapentin (NEURONTIN) capsule 600 mg  600 mg Oral TID Mariel Craft, MD   600 mg at 10/28/22 1102   hydrOXYzine (ATARAX) tablet 25 mg  25 mg Oral TID PRN Mariel Craft, MD   25 mg at 10/28/22 1245   ibuprofen (ADVIL) tablet 600 mg  600 mg Oral TID PRN Mariel Craft, MD   600 mg at 10/28/22 1245   magnesium oxide (MAG-OX) tablet 400 mg  400 mg Oral BID Mariel Craft, MD   400 mg at 10/28/22 0737   metoprolol tartrate (LOPRESSOR) tablet 25 mg  25 mg Oral Q12H Massengill, Harrold Donath, MD   25 mg at 10/28/22 0736   mometasone-formoterol (DULERA) 200-5 MCG/ACT inhaler 2 puff  2 puff Inhalation BID Mariel Craft, MD   2 puff at 10/28/22 0737   OLANZapine (ZYPREXA) tablet 5 mg  5 mg Oral BID PRN Starleen Blue, NP   5 mg at 10/27/22 1330   Or   OLANZapine (ZYPREXA) injection 5 mg  5 mg Intramuscular BID PRN Starleen Blue, NP       OLANZapine (ZYPREXA) tablet 15 mg  15 mg Oral Q24H Hill, Shelbie Hutching, MD   15 mg at 10/27/22 2111   OLANZapine zydis (ZYPREXA) disintegrating tablet 5 mg  5 mg Oral BID AC Mariel Craft, MD   5 mg at 10/28/22 1102   ondansetron (ZOFRAN-ODT) disintegrating tablet 4 mg  4 mg Oral Q8H PRN Mariel Craft, MD       pantoprazole (PROTONIX) EC tablet 40 mg  40 mg Oral QAC breakfast Mariel Craft, MD   40 mg at 10/28/22 0607   polyethylene glycol (MIRALAX / GLYCOLAX) packet 17 g  17 g Oral BID PRN Mariel Craft, MD   17 g at 10/27/22 [phone]  prazosin (MINIPRESS) capsule 1 mg  1 mg Oral QHS Mariel Craft, MD   1 mg at 10/27/22 2111   senna-docusate (Senokot-S) tablet 1 tablet  1 tablet Oral BID PRN Mariel Craft, MD   1 tablet at 10/27/22 0749   sodium bicarbonate tablet 650 mg  650 mg Oral Daily  Mariel Craft, MD   650 mg at 10/28/22 1610    Lab Results: No results found for this or any previous visit (from the past 48 hour(s)).  Blood Alcohol level:  Lab Results  Component Value Date   ETH <10 08/22/2022   ETH <10 08/09/2022    Metabolic Disorder Labs: Lab Results  Component Value Date   HGBA1C 5.4 10/22/2022   MPG 108 10/22/2022   MPG 103 04/20/2022   Lab Results  Component Value Date   PROLACTIN 66.2 (H) 03/02/2021   PROLACTIN 66.1 (H) 02/04/2020   Lab Results  Component Value Date   CHOL 192 10/22/2022   TRIG 143 10/22/2022   HDL 46 10/22/2022   CHOLHDL 4.2 10/22/2022   VLDL 29 10/22/2022   LDLCALC 117 (H) 10/22/2022   LDLCALC 106 (H) 04/20/2022    Physical Findings: AIMS:  , ,  ,  ,    CIWA:    COWS:     Musculoskeletal: Strength & Muscle Tone: within normal limits Gait & Station: normal Patient leans: N/A  Psychiatric Specialty Exam:  Presentation  General Appearance:  Disheveled; Casual  Eye Contact: Fair  Speech: Clear and Coherent  Speech Volume: Normal  Handedness: Right   Mood and Affect  Mood: Dysphoric  Affect: Blunt; Restricted   Thought Process  Thought Processes: Linear  Descriptions of Associations:Intact  Orientation:Full (Time, Place and Person)  Thought Content:Logical  History of Schizophrenia/Schizoaffective disorder:No  Duration of Psychotic Symptoms:Greater than six months  Hallucinations:Hallucinations: None  Ideas of Reference:None  Suicidal Thoughts:Suicidal Thoughts: No  Homicidal Thoughts:Homicidal Thoughts: No   Sensorium  Memory: Immediate Fair; Remote Fair; Recent Fair  Judgment: Fair  Insight: Fair   Art therapist  Concentration: Fair  Attention Span: Fair  Recall: [employer] of Knowledge: Fair  Language: Fair   Psychomotor Activity  Psychomotor Activity: Psychomotor Activity: Normal   Assets  Assets: Communication Skills; Desire for  Improvement   Sleep  Sleep: Sleep: Good Number of Hours of Sleep: 7.75    Physical Exam: Physical Exam Vitals reviewed.  Pulmonary:     Effort: Pulmonary effort is normal.  Neurological:     Mental Status: She is alert.     Motor: No weakness.     Gait: Gait normal.    Review of Systems  Constitutional:  Negative for chills and fever.  Cardiovascular:  Negative for chest pain and palpitations.  Neurological:  Negative for dizziness, tingling, tremors and headaches.  Psychiatric/Behavioral:  Negative for depression, hallucinations, memory loss, substance abuse and suicidal ideas. The patient is nervous/anxious. The patient does not have insomnia.   All other systems reviewed and are negative.  Blood pressure 126/79, pulse (!) 122, temperature 98.3 F (36.8 C), temperature source Oral, resp. rate 17, height 5\' 6"  (1.676 m), weight 107 kg, last menstrual period 08/08/2022, SpO2 99%. Body mass index is 38.09 kg/m.   Treatment Plan Summary:  ASSESSMENT:  Diagnoses / Active Problems: Schizoaffective d/o BP type GAD PTSD Borderline PD   PLAN: Safety and Monitoring:  -- Involuntary admission to inpatient psychiatric unit for safety, stabilization and treatment  -- Daily contact with patient to  assess and evaluate symptoms and progress in treatment  -- Patient's case to be discussed in multi-disciplinary team meeting  -- Observation Level : q15 minute checks  -- Vital signs:  q12 hours  -- Precautions: suicide, elopement, and assault  2. Psychiatric Diagnoses and Treatment:   Discontinue Abilify 2 mg PO daily on 10/29/2022 Continue Olanzapine 5 mg BID before lunch and supper. May increase if needed. Continue Olanzapine 15 mg PO every night Continue Cymbalta DR 60 mg PO daily Continue Prazosin 1 mg daily HS  Continue Vitamin D3 400 units PO daily  Continue Cyclobenzapine 10 mg PO daily HS Continue Gabapentin 600 mg TID   --  The  risks/benefits/side-effects/alternatives to this medication were discussed in detail with the patient and time was given for questions. The patient consents to medication trial.    -- Metabolic profile and EKG monitoring obtained while on an atypical antipsychotic (BMI: Lipid Panel: HbgA1c: QTc:)   -- Encouraged patient to participate in unit milieu and in scheduled group therapies     3. Medical Issues Being Addressed:  HTN on metoprolol 25 mg bid  Anemia on iron  Gerd on protonix    4. Discharge Planning:   -- Social work and case management to assist with discharge planning and identification of hospital follow-up needs prior to discharge  -- Estimated LOS: Possible discharge on Monday/Tuesday after being assessed by the ACT team.  -- Discharge Concerns: Need to establish a safety plan; Medication compliance and effectiveness  -- Discharge Goals: Return home with outpatient referrals for mental health follow-up including medication management/psychotherapy   Rex Kras, MD 10/28/2022, 1:26 PM  Total Time Spent in Direct Patient Care:  I personally spent 25 minutes on the unit in direct patient care. The direct patient care time included face-to-face time with the patient, reviewing the patient's chart, communicating with other professionals, and coordinating care. Greater than 50% of this time was spent in counseling or coordinating care with the patient regarding goals of hospitalization, psycho-education, and discharge planning needs.   Patient ID: [name], female   DOB: [birth year], 32 y.o.   MRN: [id number] Patient ID: [name], female   DOB: Mar 22, 1990, 32 y.o.   MRN: [id number]

## 2022-10-28 NOTE — Progress Notes (Signed)
   10/28/22 0900  Psych Admission Type (Psych Patients Only)  Admission Status Involuntary  Psychosocial Assessment  Patient Complaints Anxiety  Eye Contact Fair  Facial Expression Anxious  Affect Anxious  Speech Logical/coherent;Pressured  Interaction Assertive  Motor Activity Fidgety  Appearance/Hygiene Disheveled;Unremarkable  Behavior Characteristics Cooperative;Appropriate to situation;Anxious  Mood Anxious  Thought Process  Coherency Circumstantial  Content Preoccupation  Delusions None reported or observed  Perception Hallucinations  Hallucination Auditory  Judgment Poor  Confusion None  Danger to Self  Current suicidal ideation? Denies  Self-Injurious Behavior No self-injurious ideation or behavior indicators observed or expressed   Agreement Not to Harm Self Yes  Description of Agreement agreed to contact staff before acting on harmful thoughts  Danger to Others  Danger to Others None reported or observed  Danger to Others Abnormal  Harmful Behavior to others No threats or harm toward other people

## 2022-10-28 NOTE — Plan of Care (Signed)
  Problem: Coping: Goal: Ability to verbalize frustrations and anger appropriately will improve Outcome: Progressing Goal: Ability to demonstrate self-control will improve Outcome: Progressing   Problem: Health Behavior/Discharge Planning: Goal: Identification of resources available to assist in meeting health care needs will improve Outcome: Progressing Goal: Compliance with treatment plan for underlying cause of condition will improve Outcome: Progressing

## 2022-10-29 MED ORDER — DIPHENHYDRAMINE HCL 50 MG/ML IJ SOLN
50.0000 mg | Freq: Once | INTRAMUSCULAR | Status: AC
  Administered 2022-10-29: 50 mg via INTRAMUSCULAR
  Filled 2022-10-29 (×2): qty 1

## 2022-10-29 MED ORDER — DIPHENHYDRAMINE HCL 50 MG PO CAPS
50.0000 mg | ORAL_CAPSULE | Freq: Once | ORAL | Status: AC
  Filled 2022-10-29: qty 1

## 2022-10-29 NOTE — Progress Notes (Signed)
   10/29/22 2158  Psych Admission Type (Psych Patients Only)  Admission Status Involuntary  Psychosocial Assessment  Patient Complaints Anxiety  Eye Contact Fair  Facial Expression Animated  Affect Apprehensive  Speech Logical/coherent  Interaction Assertive  Motor Activity Fidgety  Appearance/Hygiene Unremarkable  Behavior Characteristics Cooperative  Mood Anxious  Thought Process  Coherency Circumstantial  Content Preoccupation  Delusions None reported or observed  Perception WDL  Hallucination None reported or observed  Judgment Poor  Confusion None  Danger to Self  Current suicidal ideation? Denies  Self-Injurious Behavior No self-injurious ideation or behavior indicators observed or expressed   Agreement Not to Harm Self Yes  Description of Agreement verbal contract for safety  Danger to Others  Danger to Others None reported or observed  Danger to Others Abnormal  Harmful Behavior to others No threats or harm toward other people  Destructive Behavior No threats or harm toward property   D: Patient attended evening wrap up group and engaged in discussion. Pt anticipating possible discharge on Wednesday and celebrating birthday on Friday. A: Medications administered as prescribed. Support and encouragement provided as needed.  R: Patient remains safe on the unit. Plan of care ongoing for safety and stability.

## 2022-10-29 NOTE — [facility] Counselor (Signed)
  10/29/2022  9:57 AM   [name]  Type of note: ACTT Update   ACTT team will come to [facility] 9.24.24 @ 11am to meet Pt before discharge.   Signed:  [name] MSW, [facility] 10/29/2022  9:57 AM

## 2022-10-29 NOTE — Progress Notes (Signed)
[name] is guarded, suspicious but cooperative.Agitated, pacing in hallway back and forth from room to dayroom.Stated, "Hearing voices of her parents telling her to kill herself" "They tell me to kill myself and others and destroy property" "That's what happened last night" "I got angry and kept hearing voices" She denies SI/HI today and says the voices are decreased. She says even though she still hears voices she is not going to act on them. She goes on to say that if she can have TV or radio on that she doesn't here the voices as much. 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

## 2022-10-29 NOTE — Progress Notes (Addendum)
[facility] MD Progress Note  10/29/2022 2:57 PM [name]  MRN:  [id number]  Reason for admission   [name] is a 32 y.o. female with a past history significant for schizoaffective-bipolar type, GAD, PTSD, carrier of fragile x syndrome, ASD v asperger syndrome, borderline personality, NSSIB (cutting), fibromyalgia, multiple suicide attempt, multiple inpt psych admission, who initially presented to [facility] ED 08/22/22 via EMS after ingesting multiple medications (#30 metoprolol 25mg  tablets, #60 Hydroxyzine 25mg , #7 Lexapro 20mg , #21 Gabapentin 600mg , #7 Flexeril 10mg , #7 protonix 40mg  and #7 Prazosin 1mg ) in a suicide attempt. She was placed under IVC and admitted to the ICU where she was intubated; later extubated 08/23/22 then transferred to medical unit where she remained until 10/19/22 when she was transferred to [facility] (26th admission).   Chart review from last 24 hours   The patient was seen and evaluated.  Case was discussed with the staff.  According to staff the patient was reporting hearing voices and feeling suicidal. Writer asked if she could contract for safety and she reported that she could not. the patient agittaed, took laundry hamper and tossed it, knocked over the chair and when asked to stop she reported that she would Network engineer and came towards me and swung at me. She was escorted to quiet room to await medication. While in quiet room she attempted to come back out and door had to be closed briefly until she calmed down. AC Cephas Darby. Was notified.Medication zyprexa 5mg  and ativan 1 mg given po and she contracted for safety before she returned to her room . Tayshia is guarded, suspicious but cooperative.Agitated, pacing in hallway back and forth from room to dayroom.Stated, "Hearing voices of her parents telling her to kill herself" "They tell me to kill myself and others and destroy property" "That's what happened last night" "I got angry and kept hearing voices" She denies  SI/HI today and says the voices are decreased. She says even though she still hears voices she is not going to act on them. She goes on to say that if she can have TV or radio on that she doesn't here the voices as much. 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  Possibly consolidating her antipsychotics. The plan is monitor her behavior for 1-2 day and  the ACT team to see her  and after their assessment will be probably discharged.    Information obtained during interview    Im ok now", can I go home today?" The patient was seen today and the chart was reviewed.  She was in her room,  states- Im ok now", can I go home today?",  Pt reports hearing voices telling her to hurt self", ststes it is slightly less at this time but was bad yesterday. Discussed utilizing coping skills, pt agreeable  to try to stay calm today and for 24-48 hrs so that her ACCT team can assess her for discharge, pt agreeable.  Pt  disheveled but maintained fair eye contact.  Her speech is of low volume with some latency but no obvious looseness of association or flight of ideas.  She is looking forward to being discharged and following up with the ACT team.  She currently denies any HI.  The patient is currently on 2 antipsychotics with only 2 mg of Abilify and a total of 25  mg of Zyprexa.  Plan to discontinue Abilify and maintain her on one antipsychotic if possible, see if she can be maintained on Zyprexa alone.  Principal Problem: Schizoaffective disorder, bipolar type (HCC) Diagnosis: Principal Problem:   Schizoaffective disorder, bipolar type (HCC) Active Problems:   Borderline personality disorder (HCC)   PTSD (post-traumatic stress disorder)   GAD (generalized anxiety disorder)  Total Time spent with patient: 20 minutes  Past Psychiatric History:   Previous Psych Diagnoses: Schizoaffective disorder bipolar type, Anxiety, Autism spectrum disorder, PTSD, Borderline personality disorder Prior inpatient treatment: multiple Current/prior outpatient treatment: [facility]  Prior rehab hx: denies multiple Psychotherapy hx: [facility] History of suicide: 20-30 times by overdosing on pills, cutting or drinking 409 disinfectant History of homicide or aggression: denies Psychiatric medication history:  Previously trialed Tegretol, Haldol, Clozaril, Depakote, Invega, Lamictal, Seroquel, asenapine, abilify, vraylar, Lexapro, Zoloft  Neuromodulation history: denies  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    Chronic constipation    Depression    Drug-seeking behavior    Essential tremor    Headache    Ineffective individual coping 05/16/2022   Insomnia 01/12/2022   Intentional drug overdose (HCC) 06/05/2022   Neuromuscular disorder (HCC)    Normocytic anemia 06/05/2022   Overdose 07/22/2017   Overdose of acetaminophen 07/2017   and other meds   Overdose, intentional self-harm, initial encounter (HCC) 07/20/2021   Paranoia (HCC) 04/22/2021   Personality disorder (HCC)    Purposeful non-suicidal drug ingestion (HCC) 06/27/2021   Schizo-affective psychosis (HCC)    Schizoaffective disorder (HCC) 07/29/2022   Schizoaffective disorder, bipolar type (HCC)    Seizures (HCC)    Last seizure December 2017   Skin erythema 04/27/2022   Sleep apnea    Suicidal behavior 07/25/2021   Suicidal ideation    Suicide (HCC) 07/01/2021   Suicide attempt (HCC) 07/04/2021    Past Surgical History:  Procedure  Laterality Date   MOUTH SURGERY  2009 or 2010   Family History:  Family History  Problem Relation Age of Onset   Mental illness Father    Asthma Father    PDD Brother    Seizures Brother    Family Psychiatric  History: See H&P   Social History:  Social History   Substance and Sexual Activity  Alcohol Use No   Alcohol/week: 1.0 standard drink of alcohol   Types: 1 Standard drinks or equivalent per week   Comment: denies at this time     Social History   Substance and Sexual Activity  Drug Use No   Comment: History of cocaine use at age 77 for 4 months    Social History   Socioeconomic History   Marital status: Widowed    Spouse name: Not on file   Number of children: 0   Years of education: Not on file   Highest education level: Not on file  Occupational History   Occupation: disability  Tobacco Use   Smoking status: Former    Types: Cigarettes   Smokeless tobacco: Never   Tobacco comments:    Smoked for 2  years age [age]  Vaping Use   Vaping status: Never Used  Substance and Sexual Activity   Alcohol use: No    Alcohol/week: 1.0 standard drink of alcohol    Types: 1 Standard drinks or equivalent per week    Comment: denies at this time   Drug use: No    Comment: History of cocaine use at age 60 for 4 months   Sexual activity: Not Currently    Birth control/protection: None  Other Topics Concern   Not on file  Social History Narrative   Marital status: Widowed      Children: daughter      Lives: with boyfriend, in two story home      Employment:  Disability      Tobacco: quit smoking; smoked for two years.      Alcohol ;none      Drugs: none   Has not traveled outside of the country.   Right handed         Patient with hx of Fibromyalgia,Orthostatic Tachycardia,Asthma,Arthritis,Gerd, ASD   Social Determinants of Health   Financial Resource Strain: Not on file  Food Insecurity: No Food Insecurity (10/19/2022)   Hunger Vital Sign    Worried  About Running Out of Food in the Last Year: Never true    Ran Out of Food in the Last Year: Never true  Transportation Needs: No Transportation Needs (10/19/2022)   PRAPARE - Administrator, Civil Service (Medical): No    Lack of Transportation (Non-Medical): No  Physical Activity: Not on file  Stress: Not on file  Social Connections: Not on file   Additional Social History:                          Current Medications: Current Facility-Administered Medications  Medication Dose Route Frequency Provider Last Rate Last Admin   albuterol (VENTOLIN HFA) 108 (90 Base) MCG/ACT inhaler 2 puff  2 puff Inhalation Q6H PRN Mariel Craft, MD   2 puff at 10/24/22 1315   alum & mag hydroxide-simeth (MAALOX/MYLANTA) 200-200-20 MG/5ML suspension 30 mL  30 mL Oral Q4H PRN Mariel Craft, MD   30 mL at 10/27/22 2113   ARIPiprazole (ABILIFY) tablet 2 mg  2 mg Oral Daily Mariel Craft, MD   2 mg at 10/29/22 0809   cholecalciferol (VITAMIN D3) 10 MCG (400 UNIT) tablet 400 Units  400 Units Oral Daily Mariel Craft, MD   400 Units at 10/29/22 3762   cyclobenzaprine (FLEXERIL) tablet 10 mg  10 mg Oral QHS Mariel Craft, MD   10 mg at 10/28/22 2058   diclofenac Sodium (VOLTAREN) 1 % topical gel 2 g  2 g Topical QID Starleen Blue, NP   2 g at 10/29/22 1406   DULoxetine (CYMBALTA) DR capsule 60 mg  60 mg Oral Daily Mariel Craft, MD   60 mg at 10/29/22 0809   ferrous sulfate tablet 325 mg  325 mg Oral BID WC Mariel Craft, MD   325 mg at 10/29/22 0809   gabapentin (NEURONTIN) capsule 600 mg  600 mg Oral TID Mariel Craft, MD   600 mg at 10/29/22 1142   hydrOXYzine (ATARAX) tablet 25 mg  25 mg Oral TID PRN Mariel Craft, MD   25 mg at 10/28/22 1245   ibuprofen (ADVIL) tablet 600 mg  600 mg Oral TID PRN Mariel Craft, MD   600 mg at 10/29/22 [phone]  magnesium oxide (MAG-OX) tablet 400 mg  400 mg Oral BID Mariel Craft, MD   400 mg at 10/29/22 0810   metoprolol  tartrate (LOPRESSOR) tablet 25 mg  25 mg Oral Q12H Massengill, Harrold Donath, MD   25 mg at 10/29/22 9562   mometasone-formoterol (DULERA) 200-5 MCG/ACT inhaler 2 puff  2 puff Inhalation BID Mariel Craft, MD   2 puff at 10/29/22 0808   OLANZapine (ZYPREXA) tablet 5 mg  5 mg Oral BID PRN Starleen Blue, NP   5 mg at 10/28/22 2230   Or   OLANZapine (ZYPREXA) injection 5 mg  5 mg Intramuscular BID PRN Starleen Blue, NP       OLANZapine (ZYPREXA) tablet 15 mg  15 mg Oral Q24H Hill, Shelbie Hutching, MD   15 mg at 10/28/22 2018   OLANZapine zydis (ZYPREXA) disintegrating tablet 5 mg  5 mg Oral BID AC Mariel Craft, MD   5 mg at 10/29/22 1143   ondansetron (ZOFRAN-ODT) disintegrating tablet 4 mg  4 mg Oral Q8H PRN Mariel Craft, MD   4 mg at 10/29/22 1036   pantoprazole (PROTONIX) EC tablet 40 mg  40 mg Oral QAC breakfast Mariel Craft, MD   40 mg at 10/29/22 1308   polyethylene glycol (MIRALAX / GLYCOLAX) packet 17 g  17 g Oral BID PRN Mariel Craft, MD   17 g at 10/27/22 0749   prazosin (MINIPRESS) capsule 1 mg  1 mg Oral QHS Mariel Craft, MD   1 mg at 10/28/22 2058   senna-docusate (Senokot-S) tablet 1 tablet  1 tablet Oral BID PRN Mariel Craft, MD   1 tablet at 10/27/22 0749   sodium bicarbonate tablet 650 mg  650 mg Oral Daily Mariel Craft, MD   650 mg at 10/29/22 6578    Lab Results: No results found for this or any previous visit (from the past 48 hour(s)).  Blood Alcohol level:  Lab Results  Component Value Date   ETH <10 08/22/2022   ETH <10 08/09/2022    Metabolic Disorder Labs: Lab Results  Component Value Date   HGBA1C 5.4 10/22/2022   MPG 108 10/22/2022   MPG 103 04/20/2022   Lab Results  Component Value Date   PROLACTIN 66.2 (H) 03/02/2021   PROLACTIN 66.1 (H) 02/04/2020   Lab Results  Component Value Date   CHOL 192 10/22/2022   TRIG 143 10/22/2022   HDL 46 10/22/2022   CHOLHDL 4.2 10/22/2022   VLDL 29 10/22/2022   LDLCALC 117 (H) 10/22/2022    LDLCALC 106 (H) 04/20/2022    Physical Findings: AIMS:  , ,  ,  ,    CIWA:    COWS:     Musculoskeletal: Strength & Muscle Tone: within normal limits Gait & Station: normal Patient leans: N/A  Psychiatric Specialty Exam:  Presentation  General Appearance:  Disheveled; Casual  Eye Contact: Fair  Speech: Clear and Coherent  Speech Volume: Normal  Handedness: Right   Mood and Affect  Mood: Dysphoric  Affect: Blunt; Restricted   Thought Process  Thought Processes: Linear  Descriptions of Associations:Intact  Orientation:Full (Time, Place and Person)  Thought Content:Logical  History of Schizophrenia/Schizoaffective disorder:No  Duration of Psychotic Symptoms:Greater than six months  Hallucinations:Hallucinations: None  Ideas of Reference:None  Suicidal Thoughts:Suicidal Thoughts: No  Homicidal Thoughts:Homicidal Thoughts: No   Sensorium  Memory: Immediate Fair; Remote Fair; Recent Fair  Judgment: Fair  Insight: Fair   Art therapist  Concentration:  Fair  Attention Span: Fair  Recall: [employer] of Knowledge: Fair  Language: Fair   Psychomotor Activity  Psychomotor Activity: Psychomotor Activity: Normal   Assets  Assets: Manufacturing systems engineer; Desire for Improvement   Sleep  Sleep: Sleep: Good    Physical Exam: Physical Exam Vitals reviewed.  Pulmonary:     Effort: Pulmonary effort is normal.  Neurological:     Mental Status: She is alert.     Motor: No weakness.     Gait: Gait normal.    Review of Systems  Constitutional:  Negative for chills and fever.  Cardiovascular:  Negative for chest pain and palpitations.  Neurological:  Negative for dizziness, tingling, tremors and headaches.  Psychiatric/Behavioral:  Negative for depression, hallucinations, memory loss, substance abuse and suicidal ideas. The patient is nervous/anxious. The patient does not have insomnia.   All other systems reviewed  and are negative.  Blood pressure 120/75, pulse 88, temperature 98.5 F (36.9 C), temperature source Oral, resp. rate (!) 24, height 5\' 6"  (1.676 m), weight 107 kg, last menstrual period 08/08/2022, SpO2 99%. Body mass index is 38.09 kg/m.   Treatment Plan Summary: Pt reports hearing voices and agitated yesterday.   ASSESSMENT:  Diagnoses / Active Problems: Schizoaffective d/o BP type GAD PTSD Borderline PD   PLAN: Safety and Monitoring:  -- Involuntary admission to inpatient psychiatric unit for safety, stabilization and treatment  -- Daily contact with patient to assess and evaluate symptoms and progress in treatment  -- Patient's case to be discussed in multi-disciplinary team meeting  -- Observation Level : q15 minute checks  -- Vital signs:  q12 hours  -- Precautions: suicide, elopement, and assault  2. Psychiatric Diagnoses and Treatment:   Plan to discontinue  Abilify 2 mg PO daily.  Continue Olanzapine 5 mg BID before lunch and supper. May increase if needed. Continue Olanzapine 15 mg PO every night Continue Cymbalta DR 60 mg PO daily Continue Prazosin 1 mg daily HS  Continue Vitamin D3 400 units PO daily  Continue Cyclobenzapine 10 mg PO daily HS Continue Gabapentin 600 mg TID   --  The risks/benefits/side-effects/alternatives to this medication were discussed in detail with the patient and time was given for questions. The patient consents to medication trial.    -- Metabolic profile and EKG monitoring obtained while on an atypical antipsychotic (BMI: Lipid Panel: HbgA1c: QTc:)   -- Encouraged patient to participate in unit milieu and in scheduled group therapies     3. Medical Issues Being Addressed:  HTN on metoprolol 25 mg bid  Anemia on iron  Gerd on protonix    4. Discharge Planning:   -- Social work and case management to assist with discharge planning and identification of hospital follow-up needs prior to discharge  -- Estimated LOS: Possible  discharge on Monday/Tuesday after being assessed by the ACT team.  -- Discharge Concerns: Need to establish a safety plan; Medication compliance and effectiveness  -- Discharge Goals: Return home with outpatient referrals for mental health follow-up including medication management/psychotherapy   Beverly Sessions, MD 10/29/2022, 2:57 PM  Total Time Spent in Direct Patient Care:  I personally spent 35 minutes on the unit in direct patient care. The direct patient care time included face-to-face time with the patient, reviewing the patient's chart, communicating with other professionals, and coordinating care. Greater than 50% of this time was spent in counseling or coordinating care with the patient regarding goals of hospitalization, psycho-education, and discharge planning needs.   Patient  ID: Madelyn Flavors, female   DOB: 06/07/1990, 32 y.o.   MRN: [id number] Patient ID: Louetta Zieske, female   DOB: 28-Sep-1990, 32 y.o.   MRN: [id number]

## 2022-10-29 NOTE — [facility] Group Notes (Signed)
Spiritual care group on grief and loss facilitated by Chaplain Dyanne Carrel, Bcc and Arlyce Dice, Mdiv  Group Goal: Support / Education around grief and loss  Members engage in facilitated group support and psycho-social education.  Group Description:  Following introductions and group rules, group members engaged in facilitated group dialogue and support around topic of loss, with particular support around experiences of loss in their lives. Group Identified types of loss (relationships / self / things) and identified patterns, circumstances, and changes that precipitate losses. Reflected on thoughts / feelings around loss, normalized grief responses, and recognized variety in grief experience. Group encouraged individual reflection on safe space and on the coping skills that they are already utilizing.  Group drew on Adlerian / Rogerian and narrative framework  Patient Progress: [name] attended group and actively engaged and participated in group conversation and activities. Comments were on topic and appropriate.

## 2022-10-29 NOTE — Plan of Care (Signed)
  Problem: Activity: Goal: Ability to tolerate increased activity will improve Outcome: Progressing   Problem: Respiratory: Goal: Ability to maintain a clear airway and adequate ventilation will improve Outcome: Progressing   Problem: Role Relationship: Goal: Method of communication will improve Outcome: Progressing   Problem: Education: Goal: Knowledge of [place] [facility] Education information/materials will improve Outcome: Progressing Goal: Emotional status will improve Outcome: Progressing Goal: Mental status will improve Outcome: Progressing Goal: Verbalization of understanding the information provided will improve Outcome: Progressing   Problem: Activity: Goal: Interest or engagement in activities will improve Outcome: Progressing Goal: Sleeping patterns will improve Outcome: Progressing   Problem: Coping: Goal: Ability to verbalize frustrations and anger appropriately will improve Outcome: Progressing Goal: Ability to demonstrate self-control will improve Outcome: Progressing   Problem: Health Behavior/Discharge Planning: Goal: Identification of resources available to assist in meeting health care needs will improve Outcome: Progressing Goal: Compliance with treatment plan for underlying cause of condition will improve Outcome: Progressing   Problem: Physical Regulation: Goal: Ability to maintain clinical measurements within normal limits will improve Outcome: Progressing   Problem: Safety: Goal: Periods of time without injury will increase Outcome: Progressing   Problem: Safety: Goal: Non-violent Restraint(s) Outcome: Progressing

## 2022-10-29 NOTE — Plan of Care (Signed)
  Problem: Role Relationship: Goal: Method of communication will improve Outcome: Not Progressing   Problem: Education: Goal: Mental status will improve Outcome: Not Progressing   Problem: Coping: Goal: Ability to verbalize frustrations and anger appropriately will improve Outcome: Not Progressing

## 2022-10-29 NOTE — Group Note (Signed)
Recreation Therapy Group Note   Group Topic:Stress Management  Group Date: 10/29/2022 Start Time: 0935 End Time: 0950 Facilitators: Aryonna Gunnerson-McCall, LRT,CTRS Location: [address] Dayroom   Group Topic: Stress Management   Goal Area(s) Addresses:  Patient will actively participate in stress management techniques presented during session.  Patient will successfully identify benefit of practicing stress management post d/c.   Intervention: Relaxation exercise with ambient sound and script   Group Description: Guided Imagery. LRT provided education, instruction, and demonstration on practice of visualization via guided imagery. Patient was asked to participate in the technique introduced during session. LRT debriefed including topics of mindfulness, stress management and specific scenarios each patient could use these techniques. Patients were given suggestions of ways to access scripts post d/c and encouraged to explore Youtube and other apps available on smartphones, tablets, and computers.  Education:  Stress Management, Discharge Planning.   Education Outcome: Acknowledges education   Affect/Mood: Appropriate   Participation Level: Engaged   Participation Quality: Independent   Behavior: Appropriate   Speech/Thought Process: Focused   Insight: Good   Judgement: Good   Modes of Intervention: Stress Management   Patient Response to Interventions:  Engaged   Education Outcome:  In group clarification offered    Clinical Observations/Individualized Feedback: Pt attended and participated in group session.     Plan: Continue to engage patient in RT group sessions 2-3x/week.   Preslynn Bier-McCall, LRT,CTRS 10/29/2022 12:12 PM

## 2022-10-29 NOTE — Group Note (Signed)
Occupational Therapy Group Note  Group Topic: Sleep Hygiene  Group Date: 10/29/2022 Start Time: 1430 End Time: 1503 Facilitators: Ted Mcalpine, OT   Group Description: Group encouraged increased participation and engagement through topic focused on sleep hygiene. Patients reflected on the quality of sleep they typically receive and identified areas that need improvement. Group was given background information on sleep and sleep hygiene, including common sleep disorders. Group members also received information on how to improve one's sleep and introduced a sleep diary as a tool that can be utilized to track sleep quality over a length of time. Group session ended with patients identifying one or more strategies they could utilize or implement into their sleep routine in order to improve overall sleep quality.        Therapeutic Goal(s):  Identify one or more strategies to improve overall sleep hygiene  Identify one or more areas of sleep that are negatively impacted (sleep too much, too little, etc)     Participation Level: Engaged   Participation Quality: Independent   Behavior: Appropriate   Speech/Thought Process: Relevant   Affect/Mood: Appropriate   Insight: Fair   Judgement: Fair      Modes of Intervention: Education  Patient Response to Interventions:  Attentive   Plan: Continue to engage patient in OT groups 2 - 3x/week.  10/29/2022  Ted Mcalpine, OT  Kerrin Champagne, OT

## 2022-10-29 NOTE — [facility] Group Notes (Signed)
[facility] Group Notes:  (Nursing/MHT/Case Management/Adjunct)  Date:  10/29/2022  Time:  9:28 PM  Type of Therapy:  Psychoeducational Skills  Participation Level:  Active  Participation Quality:  Appropriate  Affect:  Appropriate  Cognitive:  Appropriate  Insight:  Appropriate  Engagement in Group:  Developing/Improving  Modes of Intervention:  Education  Summary of Progress/Problems: The patient shared in group that her goal was to socialize more. She anticipates being discharged on Wednesday. Her goal for tomorrow is to find out about her discharge plans.   Hazle Coca S 10/29/2022, 9:28 PM

## 2022-10-29 NOTE — [facility] Group Notes (Signed)
Spiritual care group on grief and loss facilitated by Chaplain Dyanne Carrel, [facility]  Group Goal: Support / Education around grief and loss  Members engage in facilitated group support and psycho-social education.  Group Description:  Following introductions and group rules, group members engaged in facilitated group dialogue and support around topic of loss, with particular support around experiences of loss in their lives. Group Identified types of loss (relationships / self / things) and identified patterns, circumstances, and changes that precipitate losses. Reflected on thoughts / feelings around loss, normalized grief responses, and recognized variety in grief experience. Group encouraged individual reflection on safe space and on the coping skills that they are already utilizing.  Group drew on Adlerian / Rogerian and narrative framework  Patient Progress: [name] attended group.  She became drowsy and fell asleep for part of group, but when she was awake, she actively engaged and participated in group conversation and activities.

## 2022-10-29 NOTE — Progress Notes (Addendum)
Patient came to nursing station to report that she was hearing voices and feeling suicidal. Writer asked if she could contract for safety and she reported that she could not. She was informed that provider would be called to see if medication to help with voices could be ordered. Writer asked that she sit in the dayroom with staff so that someone would be able to monitor her. Once dayroom closed patient was informed to sit in alcove in front of nursing station until medication was available to pull from pixis. Writer was in medication room and saw patient walk pass the window. Writer asked where she was going and she reported her room. Writer left medication room to have Millee back up front until medication could be given and she refused to come back. Writer explained to her that she would need to come back to front or quiet room to be monitored. She continued to refuse and Clinical research associate asked why she was behaving like this as if she wants to sabotage her upcoming discharge.Patient became even more upset and took laundry hamper and tossed it, knocked over the chair and when asked to stop she reported that she would Network engineer and came towards me and swung at me. She was escorted to quiet room to await medication. While in quiet room she attempted to come back out and door had to be closed briefly until she calmed down. AC Cephas Darby. Was notified.Medication zyprexa 5mg  and ativan 1 mg given po and she contracted for safety before she returned to her room.

## 2022-10-30 NOTE — Plan of Care (Signed)
Problem: Activity: Goal: Interest or engagement in activities will improve Outcome: Progressing Goal: Sleeping patterns will improve Outcome: Progressing   Problem: Safety: Goal: Periods of time without injury will increase Outcome: Progressing

## 2022-10-30 NOTE — Group Note (Signed)
LCSW Group Therapy Note  Group Date: 10/30/2022 Start Time: 1100 End Time: 1145   Type of Therapy and Topic:  Group Therapy - How To Cope with Nervousness about Discharge   Participation Level:  Active   Description of Group This process group involved identification of patients' feelings about discharge. Some of them are scheduled to be discharged soon, while others are new admissions, but each of them was asked to share thoughts and feelings surrounding discharge from the hospital. One common theme was that they are excited at the prospect of going home, while another was that many of them are apprehensive about sharing why they were hospitalized. Patients were given the opportunity to discuss these feelings with their peers in preparation for discharge.  Therapeutic Goals  Patient will identify their overall feelings about pending discharge. Patient will think about how they might proactively address issues that they believe will once again arise once they get home (i.e. with parents). Patients will participate in discussion about having hope for change.   Summary of Patient Progress:  [name] was very active throughout the session. [name] demonstrated minimal insight into the subject matter, and proved open to input from peers and feedback from CSW. [name] was respectful of peers and participated throughout the entire session.   Therapeutic Modalities Cognitive Behavioral Therapy   Ane Payment, LCSW 10/30/2022  1:30 PM

## 2022-10-30 NOTE — [facility] Group Notes (Signed)
[facility] Group Notes:  (Nursing/MHT/Case Management/Adjunct)  Date:  10/30/2022  Time:  10:09 AM   Group Topic/Focus:  Emotional Education:   The focus of this group is to discuss what feelings/emotions are, and how they are experienced. Goals Group:   The focus of this group is to help patients establish daily goals to achieve during treatment and discuss how the patient can incorporate goal setting into their daily lives to aide in recovery. Orientation:   The focus of this group is to educate the patient on the purpose and policies of crisis stabilization and provide a format to answer questions about their admission.  The group details unit policies and expectations of patients while admitted.  Participation Level:  Active  Participation Quality:  Appropriate  Affect:  Appropriate  Cognitive:  Appropriate  Insight: Appropriate  Engagement in Group:  Improving  Modes of Intervention:  Discussion  Additional Comments:    [name] 10/30/2022, 10:09 AM

## 2022-10-30 NOTE — Care Management Important Message (Signed)
Medicare IM given to Evelyn Landry, LCSW to give to the patient

## 2022-10-30 NOTE — Progress Notes (Signed)
   10/30/22 1000  Psych Admission Type (Psych Patients Only)  Admission Status Involuntary  Psychosocial Assessment  Patient Complaints Anxiety;None  Eye Contact Fair  Facial Expression Animated  Affect Preoccupied  Speech Logical/coherent  Interaction Assertive  Motor Activity Fidgety  Appearance/Hygiene Unremarkable  Behavior Characteristics Cooperative  Mood Pleasant;Anxious  Thought Process  Coherency Circumstantial  Content Preoccupation  Delusions None reported or observed  Perception WDL  Hallucination None reported or observed  Judgment Poor  Confusion None  Danger to Self  Current suicidal ideation? Denies  Self-Injurious Behavior No self-injurious ideation or behavior indicators observed or expressed   Agreement Not to Harm Self Yes  Description of Agreement verbal  Danger to Others  Danger to Others None reported or observed  Danger to Others Abnormal  Harmful Behavior to others No threats or harm toward other people  Destructive Behavior No threats or harm toward property

## 2022-10-30 NOTE — Group Note (Signed)
Recreation Therapy Group Note   Group Topic:Animal Assisted Therapy   Group Date: 10/30/2022 Start Time: 0945 End Time: 1030 Facilitators: Rainee Sweatt-McCall, LRT,CTRS Location: [address] Dayroom   Animal-Assisted Activity (AAA) Program Checklist/Progress Notes Patient Eligibility Criteria Checklist & Daily Group note for Rec Tx Intervention  AAA/T Program Assumption of Risk Form signed by Patient/ or Parent Legal Guardian Yes  Patient understands his/her participation is voluntary Yes   Affect/Mood: N/A   Participation Level: Did not attend    Clinical Observations/Individualized Feedback:    Plan: Continue to engage patient in RT group sessions 2-3x/week.   [name], LRT,CTRS 10/30/2022 1:24 PM

## 2022-10-30 NOTE — Progress Notes (Cosign Needed Addendum)
[facility] MD Progress Note  10/30/2022 4:13 PM Brandalynn Brugger  MRN:  [id number]  Reason for admission   Shelese Geesaman is a 32 y.o. female with a past history significant for schizoaffective-bipolar type, GAD, PTSD, carrier of fragile x syndrome, ASD v asperger syndrome, borderline personality, NSSIB (cutting), fibromyalgia, multiple suicide attempt, multiple inpt psych admission, who initially presented to [facility] ED 08/22/22 via EMS after ingesting multiple medications (#30 metoprolol 25mg  tablets, #60 Hydroxyzine 25mg , #7 Lexapro 20mg , #21 Gabapentin 600mg , #7 Flexeril 10mg , #7 protonix 40mg  and #7 Prazosin 1mg ) in a suicide attempt. She was placed under IVC and admitted to the ICU where she was intubated; later extubated 08/23/22 then transferred to medical unit where she remained until 10/19/22 when she was transferred to [facility] (26th admission).   Chart review from last 24 hours   Sleep Hours last night: Reports a good night's sleep Nursing Concerns: none as per nursing  Behavioral episodes in the past 24 hrs:  Medication Compliance: Compliant  Vital Signs in the past 24 hrs: HR was 107 earlier today morning, but on Lopressor  PRN Medications in the past 24 hrs: Ibuprofen   Information obtained during interview   On assessment today, the pt reports that their mood is euthymic, improved since admission, and stable. Denies feeling down, depressed, or sad.  Reports that anxiety symptoms are at manageable level.  Sleep is stable. Appetite is stable.  Concentration is without complaint.  Energy level is adequate. Denies having any suicidal thoughts. Denies having any suicidal intent and plan.  Denies having any HI.  Reports auditory hallucinations, but states that they are chronic, states that the voices consist of whispers, which have never completely resolved in the past, states that she is able to focus by playing music, and keeping herself busy.  She is not observed to be responding  to any internal/external stimuli, does not seem guarded, does not seem distressed.  Due to patient's multiple overdoses in the past, it is questionable if her persistent requests for an increase in the dose of her Abilify is meant to stash up medications.  There are no overt signs of psychosis.  Patient is visible in the milieu, participating in activities, is able to interact with peers and staff, engages in group sessions, does not seem distressed.  We will keep medications at doses as current, with no increase for today & at discharge.  Denies having side effects to current psychiatric medications.   Discussed discharge planning including discharging patient tomorrow 9/25 back to her apartment pending safety planning with her ex-boyfriend with whom she resides.  The [facility] ACT team saw the patient on the unit today, and we will assist her with her medication management outside of the hospital.  We plan to discharge her with a request for her pharmacy to fill 7 days of her medications at the time. Pt verbalized no safety concerns regarding being discharged, and is looking forward to being discharged tomorrow, she is future oriented, states that she would like to take an exam so that she could be a medical coder, and that she has future plans of getting her daughter back, who is currently residing with her deceased husband's mother.  Principal Problem: Schizoaffective disorder, bipolar type (HCC) Diagnosis: Principal Problem:   Schizoaffective disorder, bipolar type (HCC) Active Problems:   Borderline personality disorder (HCC)   PTSD (post-traumatic stress disorder)   GAD (generalized anxiety disorder)  Total Time spent with patient: 20 minutes  Past  Psychiatric History:  Previous Psych Diagnoses: Schizoaffective disorder bipolar type, Anxiety, Autism spectrum disorder, PTSD, Borderline personality disorder Prior inpatient treatment: multiple Current/prior outpatient treatment: [facility]  Prior rehab hx: denies multiple Psychotherapy hx: [facility] History of suicide: 20-30 times by overdosing on pills, cutting or drinking 409 disinfectant History of homicide or aggression: denies Psychiatric medication history:  Previously trialed Tegretol, Haldol, Clozaril, Depakote, Invega, Lamictal, Seroquel, asenapine, abilify, vraylar, Lexapro, Zoloft  Neuromodulation history: denies  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    Chronic constipation    Depression    Drug-seeking behavior    Essential tremor    Headache    Ineffective individual coping 05/16/2022   Insomnia 01/12/2022   Intentional drug overdose (HCC) 06/05/2022   Neuromuscular disorder (HCC)    Normocytic anemia 06/05/2022   Overdose 07/22/2017   Overdose of acetaminophen 07/2017   and other meds   Overdose, intentional self-harm, initial encounter (HCC) 07/20/2021   Paranoia (HCC) 04/22/2021   Personality disorder (HCC)    Purposeful non-suicidal drug ingestion (HCC) 06/27/2021   Schizo-affective psychosis (HCC)    Schizoaffective disorder (HCC) 07/29/2022   Schizoaffective disorder, bipolar type (HCC)    Seizures (HCC)    Last seizure December 2017   Skin erythema 04/27/2022   Sleep apnea    Suicidal behavior 07/25/2021   Suicidal ideation    Suicide (HCC) 07/01/2021   Suicide attempt (HCC) 07/04/2021    Past Surgical History:  Procedure Laterality Date   MOUTH SURGERY  2009 or 2010   Family History:  Family History  Problem Relation Age of Onset   Mental illness Father    Asthma Father    PDD Brother    Seizures Brother    Family Psychiatric   History: See H&P   Social History:  Social History   Substance and Sexual Activity  Alcohol Use No   Alcohol/week: 1.0 standard drink of alcohol   Types: 1 Standard drinks or equivalent per week   Comment: denies at this time     Social History   Substance and Sexual Activity  Drug Use No   Comment: History of cocaine use at age 87 for 4 months    Social History   Socioeconomic History   Marital status: Widowed    Spouse name: Not on file   Number of children: 0   Years of education: Not on file   Highest education level: Not on file  Occupational History   Occupation: disability  Tobacco Use   Smoking status: Former    Types: Cigarettes   Smokeless tobacco: Never   Tobacco comments:    Smoked for 2  years age [age]  Vaping Use   Vaping status: Never Used  Substance and Sexual Activity   Alcohol use: No    Alcohol/week: 1.0 standard drink of alcohol    Types: 1 Standard drinks or equivalent per week    Comment: denies at this time   Drug use: No    Comment: History of cocaine use at age 68 for 4 months   Sexual activity: Not Currently  Birth control/protection: None  Other Topics Concern   Not on file  Social History Narrative   Marital status: Widowed      Children: daughter      Lives: with boyfriend, in two story home      Employment:  Disability      Tobacco: quit smoking; smoked for two years.      Alcohol ;none      Drugs: none   Has not traveled outside of the country.   Right handed         Patient with hx of Fibromyalgia,Orthostatic Tachycardia,Asthma,Arthritis,Gerd, ASD   Social Determinants of Health   Financial Resource Strain: Not on file  Food Insecurity: No Food Insecurity (10/19/2022)   Hunger Vital Sign    Worried About Running Out of Food in the Last Year: Never true    Ran Out of Food in the Last Year: Never true  Transportation Needs: No Transportation Needs (10/19/2022)   PRAPARE - Administrator, Civil Service  (Medical): No    Lack of Transportation (Non-Medical): No  Physical Activity: Not on file  Stress: Not on file  Social Connections: Not on file  Current Medications: Current Facility-Administered Medications  Medication Dose Route Frequency Provider Last Rate Last Admin   albuterol (VENTOLIN HFA) 108 (90 Base) MCG/ACT inhaler 2 puff  2 puff Inhalation Q6H PRN Mariel Craft, MD   2 puff at 10/24/22 1315   alum & mag hydroxide-simeth (MAALOX/MYLANTA) 200-200-20 MG/5ML suspension 30 mL  30 mL Oral Q4H PRN Mariel Craft, MD   30 mL at 10/27/22 2113   ARIPiprazole (ABILIFY) tablet 2 mg  2 mg Oral Daily Mariel Craft, MD   2 mg at 10/30/22 1610   cholecalciferol (VITAMIN D3) 10 MCG (400 UNIT) tablet 400 Units  400 Units Oral Daily Mariel Craft, MD   400 Units at 10/30/22 0738   cyclobenzaprine (FLEXERIL) tablet 10 mg  10 mg Oral QHS Mariel Craft, MD   10 mg at 10/29/22 2108   diclofenac Sodium (VOLTAREN) 1 % topical gel 2 g  2 g Topical QID Starleen Blue, NP   2 g at 10/30/22 1610   DULoxetine (CYMBALTA) DR capsule 60 mg  60 mg Oral Daily Mariel Craft, MD   60 mg at 10/30/22 9604   ferrous sulfate tablet 325 mg  325 mg Oral BID WC Mariel Craft, MD   325 mg at 10/30/22 1611   gabapentin (NEURONTIN) capsule 600 mg  600 mg Oral TID Mariel Craft, MD   600 mg at 10/30/22 1611   hydrOXYzine (ATARAX) tablet 25 mg  25 mg Oral TID PRN Mariel Craft, MD   25 mg at 10/28/22 1245   ibuprofen (ADVIL) tablet 600 mg  600 mg Oral TID PRN Mariel Craft, MD   600 mg at 10/30/22 1324   magnesium oxide (MAG-OX) tablet 400 mg  400 mg Oral BID Mariel Craft, MD   400 mg at 10/30/22 1612   metoprolol tartrate (LOPRESSOR) tablet 25 mg  25 mg Oral Q12H Massengill, Harrold Donath, MD   25 mg at 10/30/22 0738   mometasone-formoterol (DULERA) 200-5 MCG/ACT inhaler 2 puff  2 puff Inhalation BID Mariel Craft, MD   2 puff at 10/30/22 0738   OLANZapine (ZYPREXA) tablet 5 mg  5 mg Oral BID PRN  Starleen Blue, NP   5 mg at 10/28/22 2230   Or   OLANZapine (ZYPREXA) injection 5  mg  5 mg Intramuscular BID PRN Starleen Blue, NP       OLANZapine (ZYPREXA) tablet 15 mg  15 mg Oral Q24H Hill, Shelbie Hutching, MD   15 mg at 10/29/22 2051   OLANZapine zydis (ZYPREXA) disintegrating tablet 5 mg  5 mg Oral BID AC Mariel Craft, MD   5 mg at 10/30/22 1613   ondansetron (ZOFRAN-ODT) disintegrating tablet 4 mg  4 mg Oral Q8H PRN Mariel Craft, MD   4 mg at 10/29/22 1036   pantoprazole (PROTONIX) EC tablet 40 mg  40 mg Oral QAC breakfast Mariel Craft, MD   40 mg at 10/30/22 0625   polyethylene glycol (MIRALAX / GLYCOLAX) packet 17 g  17 g Oral BID PRN Mariel Craft, MD   17 g at 10/27/22 0749   prazosin (MINIPRESS) capsule 1 mg  1 mg Oral QHS Mariel Craft, MD   1 mg at 10/29/22 2108   senna-docusate (Senokot-S) tablet 1 tablet  1 tablet Oral BID PRN Mariel Craft, MD   1 tablet at 10/27/22 0749   sodium bicarbonate tablet 650 mg  650 mg Oral Daily Mariel Craft, MD   650 mg at 10/30/22 7846    Lab Results: No results found for this or any previous visit (from the past 48 hour(s)).  Blood Alcohol level:  Lab Results  Component Value Date   ETH <10 08/22/2022   ETH <10 08/09/2022    Metabolic Disorder Labs: Lab Results  Component Value Date   HGBA1C 5.4 10/22/2022   MPG 108 10/22/2022   MPG 103 04/20/2022   Lab Results  Component Value Date   PROLACTIN 66.2 (H) 03/02/2021   PROLACTIN 66.1 (H) 02/04/2020   Lab Results  Component Value Date   CHOL 192 10/22/2022   TRIG 143 10/22/2022   HDL 46 10/22/2022   CHOLHDL 4.2 10/22/2022   VLDL 29 10/22/2022   LDLCALC 117 (H) 10/22/2022   LDLCALC 106 (H) 04/20/2022   Physical Findings: AIMS:0 CIWA: n/a COWS: n/a  Musculoskeletal: Strength & Muscle Tone: within normal limits Gait & Station: normal Patient leans: N/A  Psychiatric Specialty Exam:  Presentation  General Appearance:  Fairly Groomed  Eye  Contact: Fair  Speech: Clear and Coherent  Speech Volume: Normal  Handedness: Right   Mood and Affect  Mood: Depressed  Affect: Congruent   Thought Process  Thought Processes: Coherent  Descriptions of Associations:Intact  Orientation:Full (Time, Place and Person)  Thought Content:Logical  History of Schizophrenia/Schizoaffective disorder:Yes  Duration of Psychotic Symptoms:Greater than six months  Hallucinations:Hallucinations: Auditory   Ideas of Reference:None  Suicidal Thoughts:No data recorded  Homicidal Thoughts:Homicidal Thoughts: No  Sensorium  Memory: Immediate Good  Judgment: Fair  Insight: Fair   Chartered certified accountant: Fair  Attention Span: Fair  Recall: Fair  Fund of Knowledge: Fair  Language: Fair   Psychomotor Activity  Psychomotor Activity: Psychomotor Activity: Normal   Assets  Assets: Resilience   Sleep  Sleep: Sleep: Good   Physical Exam: Physical Exam Vitals reviewed.  Pulmonary:     Effort: Pulmonary effort is normal.  Neurological:     Mental Status: She is alert.     Motor: No weakness.     Gait: Gait normal.    Review of Systems  Constitutional:  Negative for chills and fever.  Cardiovascular:  Negative for chest pain and palpitations.  Neurological:  Negative for dizziness, tingling, tremors and headaches.  Psychiatric/Behavioral:  Positive for hallucinations. Negative  for depression, memory loss, substance abuse and suicidal ideas. The patient is nervous/anxious. The patient does not have insomnia.   All other systems reviewed and are negative.  Blood pressure 109/77, pulse (!) 107, temperature 98.2 F (36.8 C), temperature source Oral, resp. rate 20, height 5\' 6"  (1.676 m), weight 107 kg, last menstrual period 08/08/2022, SpO2 100%. Body mass index is 38.09 kg/m.  Treatment Plan Summary:  ASSESSMENT: Diagnoses / Active Problems: Schizoaffective d/o BP  type GAD PTSD Borderline PD  PLAN: Safety and Monitoring:  -- Involuntary admission to inpatient psychiatric unit for safety, stabilization and treatment  -- Daily contact with patient to assess and evaluate symptoms and progress in treatment  -- Patient's case to be discussed in multi-disciplinary team meeting  -- Observation Level : q15 minute checks  -- Vital signs:  q12 hours  -- Precautions: suicide, elopement, and assault  2. Psychiatric Diagnoses and Treatment:   Continue Abilify 2 mg PO daily for psychosis Continue Olanzapine 5 mg BID before lunch and supper.  Continue Olanzapine 15 mg PO every night Continue Cymbalta DR 60 mg PO daily Continue Prazosin 1 mg daily HS  Continue Vitamin D3 400 units PO daily  Continue Cyclobenzapine 10 mg PO daily HS Continue Gabapentin 600 mg TID   --  The risks/benefits/side-effects/alternatives to this medication were discussed in detail with the patient and time was given for questions. The patient consents to medication trial.    -- Metabolic profile and EKG monitoring obtained while on an atypical antipsychotic (BMI: Lipid Panel: HbgA1c: QTc:)   -- Encouraged patient to participate in unit milieu and in scheduled group therapies    3. Medical Issues Being Addressed:  HTN on metoprolol 25 mg bid  Anemia on iron  Gerd on protonix   4. Discharge Planning:   -- Social work and case management to assist with discharge planning and identification of hospital follow-up needs prior to discharge  -- Estimated LOS: Possible discharge on Monday/Tuesday after being assessed by the ACT team.  -- Discharge Concerns: Need to establish a safety plan; Medication compliance and effectiveness  -- Discharge Goals: Return home with outpatient referrals for mental health follow-up including medication management/psychotherapy  Starleen Blue, NP 10/30/2022, 4:13 PM  Total Time Spent in Direct Patient Care:  I personally spent 50 minutes on the unit  in direct patient care. The direct patient care time included face-to-face time with the patient, reviewing the patient's chart, communicating with other professionals, and coordinating care. Greater than 50% of this time was spent in counseling or coordinating care with the patient regarding goals of hospitalization, psycho-education, and discharge planning needs.   Patient ID: Tristian Dust, female   DOB: 1990-10-09, 32 y.o.   MRN: [id number]

## 2022-10-30 NOTE — Plan of Care (Signed)
  Problem: Activity: Goal: Ability to tolerate increased activity will improve Outcome: Progressing   Problem: Respiratory: Goal: Ability to maintain a clear airway and adequate ventilation will improve Outcome: Progressing   Problem: Role Relationship: Goal: Method of communication will improve Outcome: Progressing   Problem: Education: Goal: Knowledge of [facility] information/materials will improve Outcome: Progressing Goal: Emotional status will improve Outcome: Progressing Goal: Mental status will improve Outcome: Progressing Goal: Verbalization of understanding the information provided will improve Outcome: Progressing   Problem: Activity: Goal: Interest or engagement in activities will improve Outcome: Progressing Goal: Sleeping patterns will improve Outcome: Progressing   Problem: Coping: Goal: Ability to verbalize frustrations and anger appropriately will improve Outcome: Progressing Goal: Ability to demonstrate self-control will improve Outcome: Progressing   Problem: Health Behavior/Discharge Planning: Goal: Identification of resources available to assist in meeting health care needs will improve Outcome: Progressing Goal: Compliance with treatment plan for underlying cause of condition will improve Outcome: Progressing   Problem: Physical Regulation: Goal: Ability to maintain clinical measurements within normal limits will improve Outcome: Progressing   Problem: Safety: Goal: Periods of time without injury will increase Outcome: Progressing   Problem: Safety: Goal: Non-violent Restraint(s) Outcome: Progressing

## 2022-10-31 MED ORDER — OLANZAPINE 5 MG PO TABS: 5.0000 mg | ORAL_TABLET | Freq: Two times a day (BID) | ORAL | 0 refills | Status: DC

## 2022-10-31 MED ORDER — METOPROLOL TARTRATE 25 MG PO TABS: 25.0000 mg | ORAL_TABLET | Freq: Two times a day (BID) | ORAL | 0 refills | Status: DC

## 2022-10-31 MED ORDER — GABAPENTIN 300 MG PO CAPS: 600.0000 mg | ORAL_CAPSULE | Freq: Three times a day (TID) | ORAL | 0 refills | Status: DC

## 2022-10-31 MED ORDER — PRAZOSIN HCL 1 MG PO CAPS: 1.0000 mg | ORAL_CAPSULE | Freq: Every day | ORAL | 0 refills | Status: DC

## 2022-10-31 MED ORDER — HYDROXYZINE HCL 25 MG PO TABS: 25.0000 mg | ORAL_TABLET | Freq: Three times a day (TID) | ORAL | 0 refills | Status: AC | PRN

## 2022-10-31 MED ORDER — POLYETHYLENE GLYCOL 3350 17 G PO PACK: 17.0000 g | PACK | Freq: Two times a day (BID) | ORAL | Status: DC | PRN

## 2022-10-31 MED ORDER — OLANZAPINE 5 MG PO TABS
5.0000 mg | ORAL_TABLET | Freq: Two times a day (BID) | ORAL | Status: DC
  Filled 2022-10-31: qty 1

## 2022-10-31 MED ORDER — MOMETASONE FURO-FORMOTEROL FUM 200-5 MCG/ACT IN AERO: 2.0000 | INHALATION_SPRAY | Freq: Two times a day (BID) | RESPIRATORY_TRACT | 0 refills | Status: DC

## 2022-10-31 MED ORDER — OLANZAPINE 5 MG PO TABS: 15.0000 mg | ORAL_TABLET | Freq: Every day | ORAL | 0 refills | Status: DC

## 2022-10-31 MED ORDER — PANTOPRAZOLE SODIUM 40 MG PO TBEC: 40.0000 mg | DELAYED_RELEASE_TABLET | Freq: Every day | ORAL | 0 refills | Status: DC

## 2022-10-31 MED ORDER — ARIPIPRAZOLE 2 MG PO TABS: 2.0000 mg | ORAL_TABLET | Freq: Every day | ORAL | 0 refills | Status: DC

## 2022-10-31 MED ORDER — DULOXETINE HCL 60 MG PO CPEP: 60.0000 mg | ORAL_CAPSULE | Freq: Every day | ORAL | 0 refills | Status: DC

## 2022-10-31 MED ORDER — ALBUTEROL SULFATE HFA 108 (90 BASE) MCG/ACT IN AERS: 2.0000 | INHALATION_SPRAY | Freq: Four times a day (QID) | RESPIRATORY_TRACT | 0 refills | Status: DC | PRN

## 2022-10-31 NOTE — [facility] Group Notes (Signed)
Adult Psychoeducational Group Note  Date:  10/31/2022 Time:  3:54 AM  Group Topic/Focus:  Wrap-Up Group:   The focus of this group is to help patients review their daily goal of treatment and discuss progress on daily workbooks.  Participation Level:  Did Not Attend  Participation Quality:    Affect:    Cognitive:    Insight:   Engagement in Group:    Modes of Intervention:    Additional Comments:  Pt did not attend.  [name] 10/31/2022, 3:54 AM

## 2022-10-31 NOTE — Plan of Care (Signed)
Problem: Activity: Goal: Ability to tolerate increased activity will improve Outcome: Progressing   Problem: Respiratory: Goal: Ability to maintain a clear airway and adequate ventilation will improve Outcome: Progressing   Problem: Role Relationship: Goal: Method of communication will improve Outcome: Progressing

## 2022-10-31 NOTE — [facility] Group Notes (Signed)
Psychoeducational Group Note  Date:  10/31/2022 Time:  8:30  Group Topic/Focus:  Goals Group:   The focus of this group is to help patients establish daily goals to achieve during treatment and discuss how the patient can incorporate goal setting into their daily lives to aide in recovery.  Participation Level: Did Not Attend  Participation Quality:  Not Applicable  Affect:  Not Applicable  Cognitive:  Not Applicable  Insight:  Not Applicable  Engagement in Group: Not Applicable  Additional Comments:  Pt did not attend goal group due to preparing for discharge.  [name], Sharen Counter 10/31/2022, 10:31 AM

## 2022-10-31 NOTE — Discharge Summary (Signed)
Physician Discharge Summary Note  Patient:  [name] is an 32 y.o., female MRN:  [id number] DOB:  1990/08/14 Patient phone:  [phone] (home)  Patient address:   [address] [place] [address],  Total Time spent with patient: 1.5 hours  Date of Admission:  10/19/2022 Date of Discharge: 10/31/2022  Reason for Admission:   [name] is a 32 y.o. female with a past history significant for schizoaffective-bipolar type, GAD, PTSD, carrier of fragile x syndrome, ASD v asperger syndrome, borderline personality, NSSIB (cutting), fibromyalgia, multiple suicide attempt, multiple inpt psych admission, who initially presented to [facility] ED 08/22/22 via EMS after ingesting multiple medications (#30 metoprolol 25mg  tablets, #60 Hydroxyzine 25mg , #7 Lexapro 20mg , #21 Gabapentin 600mg , #7 Flexeril 10mg , #7 protonix 40mg  and #7 Prazosin 1mg ) in a suicide attempt. She was placed under IVC and admitted to the ICU where she was intubated; later extubated 08/23/22 then transferred to medical unit where she remained until 10/19/22 when she was transferred to [facility] (26th admission).   Principal Problem: Schizoaffective disorder, bipolar type [facility]) Discharge Diagnoses: Principal Problem:   Schizoaffective disorder, bipolar type (HCC) Active Problems:   Borderline personality disorder (HCC)   PTSD (post-traumatic stress disorder)   GAD (generalized anxiety disorder)  Past Psychiatric History: See H & P  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    Chronic constipation    Depression    Drug-seeking behavior    Essential tremor     Headache    Ineffective individual coping 05/16/2022   Insomnia 01/12/2022   Intentional drug overdose (HCC) 06/05/2022   Neuromuscular disorder (HCC)    Normocytic anemia 06/05/2022   Overdose 07/22/2017   Overdose of acetaminophen 07/2017   and other meds   Overdose, intentional self-harm, initial encounter (HCC) 07/20/2021   Paranoia (HCC) 04/22/2021   Personality disorder (HCC)    Purposeful non-suicidal drug ingestion (HCC) 06/27/2021   Schizo-affective psychosis (HCC)    Schizoaffective disorder (HCC) 07/29/2022   Schizoaffective disorder, bipolar type (HCC)    Seizures (HCC)    Last seizure December 2017   Skin erythema 04/27/2022   Sleep apnea    Suicidal behavior 07/25/2021   Suicidal ideation    Suicide (HCC) 07/01/2021   Suicide attempt (HCC) 07/04/2021    Past Surgical History:  Procedure Laterality Date   MOUTH SURGERY  2009 or 2010   Family History:  Family History  Problem Relation Age of Onset   Mental illness Father    Asthma Father    PDD Brother    Seizures Brother    Family Psychiatric  History: See H & P Social History:  Social History   Substance and Sexual Activity  Alcohol Use No   Alcohol/week: 1.0 standard drink of alcohol   Types: 1 Standard drinks or equivalent per week   Comment: denies at this time     Social History   Substance and Sexual Activity  Drug Use No   Comment: History of cocaine use at age 34 for 4 months    Social History   Socioeconomic History   Marital  status: Widowed    Spouse name: Not on file   Number of children: 0   Years of education: Not on file   Highest education level: Not on file  Occupational History   Occupation: disability  Tobacco Use   Smoking status: Former    Types: Cigarettes   Smokeless tobacco: Never   Tobacco comments:    Smoked for 2  years age [age]  Vaping Use   Vaping status: Never Used  Substance and Sexual Activity   Alcohol use: No    Alcohol/week: 1.0 standard drink  of alcohol    Types: 1 Standard drinks or equivalent per week    Comment: denies at this time   Drug use: No    Comment: History of cocaine use at age 35 for 4 months   Sexual activity: Not Currently    Birth control/protection: None  Other Topics Concern   Not on file  Social History Narrative   Marital status: Widowed      Children: daughter      Lives: with boyfriend, in two story home      Employment:  Disability      Tobacco: quit smoking; smoked for two years.      Alcohol ;none      Drugs: none   Has not traveled outside of the country.   Right handed         Patient with hx of Fibromyalgia,Orthostatic Tachycardia,Asthma,Arthritis,Gerd, ASD   Social Determinants of Health   Financial Resource Strain: Not on file  Food Insecurity: No Food Insecurity (10/19/2022)   Hunger Vital Sign    Worried About Running Out of Food in the Last Year: Never true    Ran Out of Food in the Last Year: Never true  Transportation Needs: No Transportation Needs (10/19/2022)   PRAPARE - Administrator, Civil Service (Medical): No    Lack of Transportation (Non-Medical): No  Physical Activity: Not on file  Stress: Not on file  Social Connections: Not on file   Hospital Course:   During the patient's hospitalization, patient had extensive initial psychiatric evaluation, and follow-up psychiatric evaluations every day.   Psychiatric diagnoses provided upon initial assessment: As listed above.   Patient's psychiatric medications were adjusted on admission as follows: Abilify 2 mg PO daily for psychosis Olanzapine 5 mg BID before lunch and supper for psychosis/mood stabilization Olanzapine 15 mg PO every evening for psychosis/mood stabilization Cymbalta DR 60 mg PO daily for depressive symptoms Prazosin 1 mg daily HS for nightmares   During the hospitalization, other adjustments were made to the patient's psychiatric medication regimen. Medications at discharge are as follows:    Continue Abilify 2 mg PO daily for psychosis Continue Olanzapine 5 mg BID before lunch and supper.  Continue Olanzapine 15 mg PO every night Continue Cymbalta DR 60 mg PO daily Continue Prazosin 1 mg daily HS  Continue Gabapentin 600 mg TID  Attending Psychiatrist met with patient twice on 9/24 and again on 9/25 and also met with ACT team in person. They will distribute 7 days of meds at a time. Attending Psychiatrist sent Rx to [facility] at time of discharge. Pt's Assertive community treatment team (the [facility]) is agreeable to managing patient's medications on an outpatient basis.   Patient's care was discussed during the interdisciplinary team meeting every day during the hospitalization. The patient denies having side effects to prescribed psychiatric medication. Gradually, patient started adjusting to milieu. The patient was  evaluated each day by a clinical provider to ascertain response to treatment. Improvement was noted by the patient's report of decreasing symptoms, improved sleep and appetite, affect, medication tolerance, behavior, and participation in unit programming.  Patient was asked each day to complete a self inventory noting mood, mental status, pain, new symptoms, anxiety and concerns.     Symptoms were reported as significantly decreased or resolved completely by discharge. On day of discharge, the patient reports that their mood is stable. The patient denied having suicidal thoughts for more than 48 hours prior to discharge.  Patient denies having homicidal thoughts.  Patient reports having mild auditory hallucinations, but states that this is her baseline and that the voices never completely go away, and that she is able to function well. There are no overt signs of psychosis. Pt does not appear distressed, and does not seem to be responding to any internal/external stimuli. Patient denies any visual hallucinations or other symptoms of psychosis. The patient was  motivated to continue taking medication with a goal of continued improvement in mental health.    The patient reports their target psychiatric symptoms of depression, anxiety, psychosis, insomnia responded well to the psychiatric medications, and the patient reports overall benefit from this psychiatric hospitalization. Supportive psychotherapy was provided to the patient. The patient also participated in regular group therapy while hospitalized. Coping skills, problem solving as well as relaxation therapies were also part of the unit programming. Symptoms are currently stable enough to be managed on an outpatient basis.   Labs were reviewed with the patient, and abnormal results were discussed with the patient. Hemoglobin A1C, Lipid panel and TSH are WNL. EKG with Qtc 452.   The patient is able to verbalize their individual safety plan to this provider.   # It is recommended to the patient to continue psychiatric medications as prescribed, after discharge from the hospital.     # It is recommended to the patient to follow up with your outpatient psychiatric provider and PCP.   # It was discussed with the patient, the impact of alcohol, drugs, tobacco have been there overall psychiatric and medical wellbeing, and total abstinence from substance use was recommended the patient.ed.   # Prescriptions provided or sent directly to preferred pharmacy at discharge. Patient agreeable to plan. Given opportunity to ask questions. Appears to feel comfortable with discharge.    # In the event of worsening symptoms, the patient is instructed to call the crisis hotline (988), 911 and or go to the nearest ED for appropriate evaluation and treatment of symptoms. To follow-up with primary care provider for other medical issues, concerns and or health care needs   # Patient was discharged home where she resides with her ex boyfriend with a plan to follow up as noted below. Safety planning was completed by CSW.    Total Time spent with patient: 45 minutes  Physical Findings: AIMS: 0 CIWA: n/a COWS: n/a  Musculoskeletal: Strength & Muscle Tone: within normal limits Gait & Station: normal Patient leans: N/A  Psychiatric Specialty Exam:  Presentation  General Appearance:  Appropriate for Environment; Casual; Fairly Groomed  Eye Contact: Good  Speech: Normal Rate; Clear and Coherent  Speech Volume: Normal  Handedness: Right   Mood and Affect  Mood: Euthymic; Anxious  Affect: Appropriate; Congruent; Full Range   Thought Process  Thought Processes: Linear  Descriptions of Associations:Intact  Orientation:Full (Time, Place and Person)  Thought Content:Logical  History of Schizophrenia/Schizoaffective disorder:No  Duration of Psychotic Symptoms:Greater  than six months  Hallucinations:Hallucinations: Auditory Description of Command Hallucinations: non-command, described to be at baseline  Ideas of Reference:None  Suicidal Thoughts:Suicidal Thoughts: No  Homicidal Thoughts:Homicidal Thoughts: No   Sensorium  Memory: Immediate Good; Recent Good; Remote Good  Judgment: Fair  Insight: Fair   Chartered certified accountant: Fair  Attention Span: Fair  Recall: Good  Fund of Knowledge: Good  Language: Good   Psychomotor Activity  Psychomotor Activity: Psychomotor Activity: Normal   Assets  Assets: Resilience   Sleep  Sleep: Sleep: Fair  Physical Exam: Physical Exam Review of Systems  Psychiatric/Behavioral:  Positive for depression (Denies SI/HI, denies plan, denies intent) and hallucinations (resolving). Negative for memory loss, substance abuse and suicidal ideas. The patient is nervous/anxious (resolving) and has insomnia (Resolving).    Blood pressure 116/72, pulse (!) 108, temperature 98.8 F (37.1 C), temperature source Oral, resp. rate 20, height 5\' 6"  (1.676 m), weight 107 kg, last menstrual period 08/08/2022, SpO2  98%. Body mass index is 38.09 kg/m. -On Inderal for Tachycardia   Social History   Tobacco Use  Smoking Status Former   Types: Cigarettes  Smokeless Tobacco Never  Tobacco Comments   Smoked for 2  years age [age]   Tobacco Cessation:  N/A, patient does not currently use tobacco products   Blood Alcohol level:  Lab Results  Component Value Date   ETH <10 08/22/2022   ETH <10 08/09/2022    Metabolic Disorder Labs:  Lab Results  Component Value Date   HGBA1C 5.4 10/22/2022   MPG 108 10/22/2022   MPG 103 04/20/2022   Lab Results  Component Value Date   PROLACTIN 66.2 (H) 03/02/2021   PROLACTIN 66.1 (H) 02/04/2020   Lab Results  Component Value Date   CHOL 192 10/22/2022   TRIG 143 10/22/2022   HDL 46 10/22/2022   CHOLHDL 4.2 10/22/2022   VLDL 29 10/22/2022   LDLCALC 117 (H) 10/22/2022   LDLCALC 106 (H) 04/20/2022    See Psychiatric Specialty Exam and Suicide Risk Assessment completed by Attending Physician prior to discharge.  Discharge destination:  Home  Is patient on multiple antipsychotic therapies at discharge:  No   Has Patient had three or more failed trials of antipsychotic monotherapy by history:  No  Recommended Plan for Multiple Antipsychotic Therapies: Taper to monotherapy as described:  Outpatient mental health provider to continue to monitor symptoms and taper down to one antipsychotic medication as pt's symptoms continue to improve.  Discharge Instructions     Diet - low sodium heart healthy   Complete by: As directed    Increase activity slowly   Complete by: As directed       Allergies as of 10/31/2022       Reactions   Bee Venom Anaphylaxis   Coconut Flavor Anaphylaxis, Rash   Fish Allergy Anaphylaxis   Geodon [ziprasidone Hcl] Other (See Comments)   Pt states that this medication causes paralysis of the mouth.     Haloperidol And Related Other (See Comments)   Pt states that this medication causes paralysis of the mouth, jaw  locks up   Lithobid [lithium] Other (See Comments)   Seizure-like activity   Roxicodone [oxycodone] Other (See Comments)   Hallucinations    Seroquel [quetiapine] Other (See Comments)   Severe drowsiness - Pt currently taking 12.5 mg BID, she is ok with this dose   Shellfish Allergy Anaphylaxis   Phenergan [promethazine Hcl] Other (See Comments)   Chest pain  Prilosec [omeprazole] Nausea And Vomiting, Other (See Comments)   Pt can take protonix with no problems   Sulfa Antibiotics Other (See Comments)   Chest pain    Tegretol [carbamazepine] Nausea And Vomiting   Prozac [fluoxetine] Other (See Comments)   Increased Depression and Suicidal thoughts   Tape Other (See Comments)   Skin tears, can only tolerate paper tape.   Tylenol [acetaminophen] Rash, Other (See Comments)   Rash on face         Medication List     STOP taking these medications    cholecalciferol 10 MCG (400 UNIT) Tabs tablet Commonly known as: VITAMIN D3   cyclobenzaprine 10 MG tablet Commonly known as: FLEXERIL   gabapentin 600 MG tablet Commonly known as: NEURONTIN Replaced by: gabapentin 300 MG capsule   LORazepam 1 MG tablet Commonly known as: ATIVAN   magnesium oxide 400 (240 Mg) MG tablet Commonly known as: MAG-OX   OLANZapine zydis 5 MG disintegrating tablet Commonly known as: ZYPREXA Replaced by: OLANZapine 5 MG tablet You also have another medication with the same name that you need to continue taking as instructed.   ondansetron 4 MG disintegrating tablet Commonly known as: ZOFRAN-ODT   sodium bicarbonate 650 MG tablet       TAKE these medications      Indication  albuterol 108 (90 Base) MCG/ACT inhaler Commonly known as: VENTOLIN HFA Inhale 2 puffs into the lungs every 6 (six) hours as needed for wheezing or shortness of breath.  Indication: Asthma   ARIPiprazole 2 MG tablet Commonly known as: ABILIFY Take 1 tablet (2 mg total) by mouth daily for 7 days.  Indication:  Schizophrenia   diclofenac Sodium 1 % Gel Commonly known as: VOLTAREN Apply 2 g topically 4 (four) times daily. What changed:  when to take this reasons to take this  Indication: Joint Damage causing Pain and Loss of Function   DULoxetine 60 MG capsule Commonly known as: CYMBALTA Take 1 capsule (60 mg total) by mouth daily for 7 days. Start taking on: November 01, 2022  Indication: Generalized Anxiety Disorder   ferrous sulfate 325 (65 FE) MG tablet Take 1 tablet (325 mg total) by mouth 2 (two) times daily with a meal.  Indication: Anemia From Inadequate Iron in the Body   gabapentin 300 MG capsule Commonly known as: NEURONTIN Take 2 capsules (600 mg total) by mouth 3 (three) times daily for 7 days. Replaces: gabapentin 600 MG tablet  Indication: Generalized Anxiety Disorder   hydrOXYzine 25 MG tablet Commonly known as: ATARAX Take 1 tablet (25 mg total) by mouth 3 (three) times daily as needed for up to 7 days for anxiety.  Indication: Feeling Anxious   ibuprofen 600 MG tablet Commonly known as: ADVIL Take 1 tablet (600 mg total) by mouth 3 (three) times daily as needed for mild pain or moderate pain.  Indication: Pain   metoprolol tartrate 25 MG tablet Commonly known as: LOPRESSOR Take 1 tablet (25 mg total) by mouth every 12 (twelve) hours for 7 days. What changed:  how much to take when to take this  Indication: tachycardia   mometasone-formoterol 200-5 MCG/ACT Aero Commonly known as: DULERA Inhale 2 puffs into the lungs 2 (two) times daily.  Indication: Asthma   OLANZapine 5 MG tablet Commonly known as: ZYPREXA Take 3 tablets (15 mg total) by mouth at bedtime for 7 days. What changed:  medication strength when to take this Another medication with the same name was removed. Continue  taking this medication, and follow the directions you see here.  Indication: Schizophrenia   OLANZapine 5 MG tablet Commonly known as: ZYPREXA Take 1 tablet (5 mg total) by  mouth 2 (two) times daily for 7 days. Take 1 tablet at lunch. Take 1 tablet at supper. Start taking on: November 01, 2022 What changed: You were already taking a medication with the same name, and this prescription was added. Make sure you understand how and when to take each. Replaces: OLANZapine zydis 5 MG disintegrating tablet  Indication: Schizophrenia   pantoprazole 40 MG tablet Commonly known as: PROTONIX Take 1 tablet (40 mg total) by mouth daily before breakfast for 7 days.  Indication: Heartburn   polyethylene glycol 17 g packet Commonly known as: MIRALAX / GLYCOLAX Take 17 g by mouth 2 (two) times daily as needed for mild constipation.  Indication: Constipation   prazosin 1 MG capsule Commonly known as: MINIPRESS Take 1 capsule (1 mg total) by mouth at bedtime for 7 days.  Indication: Frightening Dreams   senna-docusate 8.6-50 MG tablet Commonly known as: Senokot-S Take 1 tablet by mouth 2 (two) times daily as needed for moderate constipation. What changed: when to take this  Indication: Constipation        Follow-up Information     [facility] Follow up.   Contact information: [address] [place] [address] [phone]         [facility] Follow up.   Contact information: [address] [place] [address] [phone]                Signed: Starleen Blue, NP 10/31/2022, 3:23 PM

## 2022-10-31 NOTE — [facility]HH Suicide Risk Assessment (Addendum)
Suicide Risk Assessment  Discharge Assessment    [facility] Discharge Suicide Risk Assessment   Principal Problem: Schizoaffective disorder, bipolar type [facility]) Discharge Diagnoses: Principal Problem:   Schizoaffective disorder, bipolar type (HCC) Active Problems:   Borderline personality disorder (HCC)   PTSD (post-traumatic stress disorder)   GAD (generalized anxiety disorder)  Reason for admission   [name] is a 32 y.o. 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 She was placed under IVC and admitted to the ICU where she was intubated; later extubated 08/23/22 then transferred to medical unit where she remained until 10/19/22 when she was transferred to [facility] (26th admission).   During the patient's hospitalization, patient had extensive initial psychiatric evaluation, and follow-up psychiatric evaluations every day.  Psychiatric diagnoses provided upon initial assessment: As listed above.  Patient's psychiatric medications were adjusted on admission as follows: Abilify 2 mg PO daily for psychosis Olanzapine 5 mg BID before lunch and supper for psychosis/mood stabilization Olanzapine 15 mg PO every evening for psychosis/mood stabilization Cymbalta DR 60 mg PO daily for depressive symptoms Prazosin 1 mg daily HS for nightmares   During the hospitalization, other adjustments were made to the patient's psychiatric medication regimen. Medications at discharge are as follows:  Continue Abilify 2 mg PO daily for  psychosis Continue Olanzapine 5 mg BID before lunch and supper.  Continue Olanzapine 15 mg PO every night Continue Cymbalta DR 60 mg PO daily Continue Prazosin 1 mg daily HS  Continue Gabapentin 600 mg TID  Attending Psychiatrist met with patient twice on 9/24 and again on 9/25 and also met with ACT team in person. They will distribute 7 days of meds at a time. Attending Psychiatrist sent Rx [facility] at time of discharge. Pt's Assertive community treatment team (the [facility]) is agreeable to managing patient's medications on an outpatient basis.  Patient's care was discussed during the interdisciplinary team meeting every day during the hospitalization. The patient denies having side effects to prescribed psychiatric medication. Gradually, patient started adjusting to milieu. The patient was evaluated each day by a clinical provider to ascertain response to treatment. Improvement was noted by the patient's report of decreasing symptoms, improved sleep and appetite, affect, medication tolerance, behavior, and participation in unit programming.  Patient was asked each day to complete a self inventory noting mood, mental status, pain, new symptoms, anxiety and concerns.    Symptoms were reported as significantly decreased or resolved completely by discharge. On day of discharge, the patient reports that their mood is stable. The patient denied having suicidal thoughts for more than 48 hours prior to discharge.  Patient denies having homicidal thoughts.  Patient reports having mild auditory hallucinations, but states that this is her baseline and that the voices never completely go away, and that she is able to function well. There are no overt signs of psychosis. Pt does not appear distressed, and does not seem to be responding to any internal/external stimuli. Patient denies any visual hallucinations or other symptoms of psychosis. The patient was motivated to continue taking  medication with a goal of continued improvement in mental  health.   The patient reports their target psychiatric symptoms of depression, anxiety, psychosis, insomnia responded well to the psychiatric medications, and the patient reports overall benefit from this psychiatric hospitalization. Supportive psychotherapy was provided to the patient. The patient also participated in regular group therapy while hospitalized. Coping skills, problem solving as well as relaxation therapies were also part of the unit programming. Symptoms are currently stable enough to be managed on an outpatient basis.  Labs were reviewed with the patient, and abnormal results were discussed with the patient. Hemoglobin A1C, Lipid panel and TSH are WNL. EKG with Qtc 452.  The patient is able to verbalize their individual safety plan to this provider.  # It is recommended to the patient to continue psychiatric medications as prescribed, after discharge from the hospital.    # It is recommended to the patient to follow up with your outpatient psychiatric provider and PCP.  # It was discussed with the patient, the impact of alcohol, drugs, tobacco have been there overall psychiatric and medical wellbeing, and total abstinence from substance use was recommended the patient.ed.  # Prescriptions provided or sent directly to preferred pharmacy at discharge. Patient agreeable to plan. Given opportunity to ask questions. Appears to feel comfortable with discharge.    # In the event of worsening symptoms, the patient is instructed to call the crisis hotline (988), 911 and or go to the nearest ED for appropriate evaluation and treatment of symptoms. To follow-up with primary care provider for other medical issues, concerns and or health care needs  # Patient was discharged home where she resides with her ex boyfriend with a plan to follow up as noted below. Safety planning was completed by CSW.  Total Time spent with patient: 45  minutes  Musculoskeletal: Strength & Muscle Tone: within normal limits Gait & Station: normal Patient leans: N/A  Psychiatric Specialty Exam  Presentation  General Appearance:  Appropriate for Environment; Casual; Fairly Groomed  Eye Contact: Good  Speech: Normal Rate; Clear and Coherent  Speech Volume: Normal  Handedness: Right   Mood and Affect  Mood: Euthymic; Anxious  Duration of Depression Symptoms: Greater than two weeks  Affect: Appropriate; Congruent; Full Range   Thought Process  Thought Processes: Linear  Descriptions of Associations:Intact  Orientation:Full (Time, Place and Person)  Thought Content:Logical  History of Schizophrenia/Schizoaffective disorder:No  Duration of Psychotic Symptoms:Greater than six months  Hallucinations:Hallucinations: Auditory Description of Command Hallucinations: non-command, described to be at baseline  Ideas of Reference:None  Suicidal Thoughts:Suicidal Thoughts: No  Homicidal Thoughts:Homicidal Thoughts: No   Sensorium  Memory: Immediate Good; Recent Good; Remote Good  Judgment: Fair  Insight: Fair   Chartered certified accountant: Fair  Attention Span: Fair  Recall: Good  Fund of Knowledge: Good  Language: Good   Psychomotor Activity  Psychomotor Activity: Psychomotor Activity: Normal   Assets  Assets: Resilience   Sleep  Sleep: Sleep: Fair   Physical Exam: Physical Exam Constitutional:      Appearance: Normal appearance.  HENT:     Nose: Nose normal.  Musculoskeletal:     Cervical back: Normal range of motion.  Skin:    General: Skin is warm.  Neurological:     General: No focal deficit present.     Mental Status: She is alert and oriented to person, place, and time.    Review of Systems  Constitutional: Negative.   HENT: Negative.    Eyes: Negative.   Respiratory: Negative.    Cardiovascular: Negative.  Gastrointestinal: Negative.    Genitourinary: Negative.   Musculoskeletal: Negative.   Skin: Negative.   Neurological: Negative.   Psychiatric/Behavioral:  Positive for depression (Denies SI/HI, denies plan, denies intent) and hallucinations (+AH which pt states is mild, and not bothersome at this time. Stable enough for management on an outpatient basis). Negative for substance abuse and suicidal ideas. The patient is nervous/anxious (Resolving on current medications) and has insomnia (REsolving on current medications).    Blood pressure 116/72, pulse (!) 108, temperature 98.8 F (37.1 C), temperature source Oral, resp. rate 20, height 5\' 6"  (1.676 m), weight 107 kg, last menstrual period 08/08/2022, SpO2 98%. Body mass index is 38.09 kg/m.  Mental Status Per Nursing Assessment::   On Admission:  Suicidal ideation indicated by others, Suicide plan, Self-harm thoughts  Demographic Factors:  Caucasian and Unemployed  Loss Factors: Decrease in vocational status and Financial problems/change in socioeconomic status  Historical Factors: Impulsivity  Risk Reduction Factors:   Positive social support  Continued Clinical Symptoms:  More than one psychiatric diagnosis Unstable or Poor Therapeutic Relationship Previous Psychiatric Diagnoses and Treatments  Cognitive Features That Contribute To Risk:  None    Suicide Risk:  Chronic:  There are no current identifiable suicide plans, no associated intent, mild dysphoria and related symptoms, good self-control (both objective and subjective assessment), few other risk factors, and identifiable protective factors, including available and accessible social support. Pt is verbally contracting for safety outside of [place] at this time, but her suicide risk remains chronic due to her high level of impulsivity along with her multiple suicide attempts via overdosing.  She is agreeable to utilizing her safety plan that was designed for her here at the hospital, along with  calling her ACT team, [phone], 911, or presenting to the nearest ER or to the [facility] behavioral health urgent care should her suicidal ideations return after discharge or should she start having self harm urges or should she start having worsening psychosis.   Pt understands that we cannot guarantee that her symptoms will not return after discharge.  However, at time of discharge, she poses no risks of danger to herself or to anyone else at time of discharge.  She verbally contracts for safety outside of the hospital. Her ACT team is agreeable to assisting with her care outside of this hospital.  Symptoms are stable enough for management outside of the hospital at this time.   Follow-up Information     [facility] Follow up.   Contact information: [address] [place] [address] [phone]         [facility] Follow up.   Contact information: [address] [place] [address] [phone]                Starleen Blue, NP 10/31/2022, 10:48 AM

## 2022-10-31 NOTE — Progress Notes (Signed)
Order received for patients discharge. AVS reviewed with patient including crisis services and follow up care plan. Pt denies any SI HI or AV Hallucinations. No signs of acute decompensation. Pt verbalized understanding of follow up care plan and all belongings returned. RX given and reviewed with patient. Pt states '' I want to get my daughter back so I am going to do right '' Pt escorted from unit to lobby and assisted into cab for return to home per SW.

## 2022-10-31 NOTE — Progress Notes (Signed)
   10/31/22 0000  Psych Admission Type (Psych Patients Only)  Admission Status Involuntary  Psychosocial Assessment  Patient Complaints Anxiety;Self-harm thoughts  Eye Contact Brief  Facial Expression Anxious  Affect Labile  Speech Argumentative  Interaction Childlike;Guarded;Attention-seeking  Motor Activity Fidgety  Appearance/Hygiene Unremarkable  Behavior Characteristics Impulsive  Mood Preoccupied  Thought Process  Coherency Circumstantial  Content Preoccupation  Delusions None reported or observed  Perception Hallucinations  Hallucination Auditory  Judgment Impaired  Confusion None  Danger to Self  Current suicidal ideation? Passive  Self-Injurious Behavior Some self-injurious ideation observed or expressed.  No lethal plan expressed   Agreement Not to Harm Self Yes  Description of Agreement Verbal  Danger to Others  Danger to Others None reported or observed  Danger to Others Abnormal  Harmful Behavior to others No threats or harm toward other people  Destructive Behavior No threats or harm toward property   Pt complained of hearing voices telling to hurt herself. Pt medicated with Zyprexa 5 mg PO for agitation, pt is currently in bed a sleep, will continue to monitor.

## 2022-10-31 NOTE — [facility]HH Counselor (Signed)
10/31/2022  Madelyn Flavors DOB: December 23, 1990 MRN: [id number]   RIDER WAIVER AND RELEASE OF LIABILITY  For the purposes of helping with transportation needs, [facility] partners with outside transportation providers (taxi companies, [place], Catering manager.) to give [employer] patients or other approved people the choice of on-demand rides [employer]") to our buildings for non-emergency visits.  By using [employer], I, the person signing this document, on behalf of myself and/or any legal minors (in my care using the [employer]), agree:  Science writer given to me are supplied by independent, outside transportation providers who do not work for, or have any affiliation with, [employer]. [facility] is not a transportation company. [facility] has no control over the quality or safety of the rides I get using [employer]. [facility] has no control over whether any outside ride will happen on time or not. [facility] gives no guarantee on the reliability, quality, safety, or availability on any rides, or that no mistakes will happen. I know and accept that traveling by vehicle (car, truck, [employer], Zenaida Niece, bus, taxi, etc.) has risks of serious injuries such as disability, being paralyzed, and death. I know and agree the risk of using [employer] is mine alone, and not [employer]. Transport Services are provided "as is" and as are available. The transportation providers are in charge for all inspections and care of the vehicles used to provide these rides. I agree not to take legal action against [place], its agents, employees, officers, directors, representatives, insurers, attorneys, assigns, successors, subsidiaries, and affiliates at any time for any reasons related directly or indirectly to using [employer]. I also agree not to take legal action against [place] or its affiliates for any injury, death, or damage to property caused by or related to  using [employer]. I have read this Waiver and Release of Liability, and I understand the terms used in it and their legal meaning. This Waiver is freely and voluntarily given with the understanding that my right (or any legal minors) to legal action against [place] relating to [employer] is knowingly given up to use these services.   I attest that I read the Ride Waiver and Release of Liability to Madelyn Flavors, gave Ms. Spicher the opportunity to ask questions and answered the questions asked (if any). I affirm that Madelyn Flavors then provided consent for assistance with transportation.

## 2022-11-01 ENCOUNTER — Emergency Department (HOSPITAL_COMMUNITY): Payer: Medicare PPO

## 2022-11-01 ENCOUNTER — Emergency Department (HOSPITAL_COMMUNITY)
Admission: EM | Admit: 2022-11-01 | Discharge: 2022-11-02 | Disposition: A | Discharge status: Home / Self Care | Payer: Medicare PPO | Attending: Emergency Medicine | Admitting: Emergency Medicine

## 2022-11-01 ENCOUNTER — Other Ambulatory Visit: Payer: Self-pay

## 2022-11-01 DIAGNOSIS — M25461 Effusion, right knee: Secondary | ICD-10-CM | POA: Diagnosis not present

## 2022-11-01 DIAGNOSIS — M25561 Pain in right knee: Secondary | ICD-10-CM | POA: Diagnosis present

## 2022-11-01 NOTE — ED Provider Notes (Incomplete)
[place] EMERGENCY DEPARTMENT AT [facility] Provider Note   CSN: [id number] Arrival date & time: 11/01/22  2234     History {Add pertinent medical, surgical, social history, OB history to HPI:1} Chief Complaint  Patient presents with  . Knee Pain    R    [name] is a 32 y.o. female.  32 year old female with right knee pain. Patient reports pain to the front of her knee with popping in the joint. No recent or remote injuries. Patient has been icing her knee without improvement.        Home Medications Prior to Admission medications   Medication Sig Start Date End Date Taking? Authorizing Provider  albuterol (VENTOLIN HFA) 108 (90 Base) MCG/ACT inhaler Inhale 2 puffs into the lungs every 6 (six) hours as needed for wheezing or shortness of breath. 10/31/22   Massengill, Harrold Donath, MD  ARIPiprazole (ABILIFY) 2 MG tablet Take 1 tablet (2 mg total) by mouth daily for 7 days. 10/31/22 11/07/22  Massengill, Harrold Donath, MD  diclofenac Sodium (VOLTAREN) 1 % GEL Apply 2 g topically 4 (four) times daily. Patient taking differently: Apply 2 g topically 4 (four) times daily as needed. 10/19/22   Osvaldo Shipper, MD  DULoxetine (CYMBALTA) 60 MG capsule Take 1 capsule (60 mg total) by mouth daily for 7 days. 11/01/22 11/08/22  Massengill, Harrold Donath, MD  ferrous sulfate 325 (65 FE) MG tablet Take 1 tablet (325 mg total) by mouth 2 (two) times daily with a meal. 10/19/22   Osvaldo Shipper, MD  gabapentin (NEURONTIN) 300 MG capsule Take 2 capsules (600 mg total) by mouth 3 (three) times daily for 7 days. 10/31/22 11/07/22  Massengill, Harrold Donath, MD  hydrOXYzine (ATARAX) 25 MG tablet Take 1 tablet (25 mg total) by mouth 3 (three) times daily as needed for up to 7 days for anxiety. 10/31/22 11/07/22  Massengill, Harrold Donath, MD  ibuprofen (ADVIL) 600 MG tablet Take 1 tablet (600 mg total) by mouth 3 (three) times daily as needed for mild pain or moderate pain. 10/19/22   Osvaldo Shipper, MD   metoprolol tartrate (LOPRESSOR) 25 MG tablet Take 1 tablet (25 mg total) by mouth every 12 (twelve) hours for 7 days. 10/31/22 11/07/22  Massengill, Harrold Donath, MD  mometasone-formoterol (DULERA) 200-5 MCG/ACT AERO Inhale 2 puffs into the lungs 2 (two) times daily. 10/31/22   Massengill, Harrold Donath, MD  OLANZapine (ZYPREXA) 5 MG tablet Take 3 tablets (15 mg total) by mouth at bedtime for 7 days. 10/31/22 11/07/22  Massengill, Harrold Donath, MD  OLANZapine (ZYPREXA) 5 MG tablet Take 1 tablet (5 mg total) by mouth 2 (two) times daily for 7 days. Take 1 tablet at lunch. Take 1 tablet at supper. 11/01/22 11/08/22  Massengill, Harrold Donath, MD  pantoprazole (PROTONIX) 40 MG tablet Take 1 tablet (40 mg total) by mouth daily before breakfast for 7 days. 10/31/22 11/07/22  Massengill, Harrold Donath, MD  polyethylene glycol (MIRALAX / GLYCOLAX) 17 g packet Take 17 g by mouth 2 (two) times daily as needed for mild constipation. 10/31/22   Massengill, Harrold Donath, MD  prazosin (MINIPRESS) 1 MG capsule Take 1 capsule (1 mg total) by mouth at bedtime for 7 days. 10/31/22 11/07/22  Massengill, Harrold Donath, MD  senna-docusate (SENOKOT-S) 8.6-50 MG tablet Take 1 tablet by mouth 2 (two) times daily as needed for moderate constipation. Patient taking differently: Take 1 tablet by mouth daily. 10/19/22   Osvaldo Shipper, MD      Allergies    Bee venom, Coconut flavor, Fish allergy,  Geodon [ziprasidone hcl], Haloperidol and related, Lithobid [lithium], Roxicodone [oxycodone], Seroquel [quetiapine], Shellfish allergy, Phenergan [promethazine hcl], Prilosec [omeprazole], Sulfa antibiotics, Tegretol [carbamazepine], Prozac [fluoxetine], Tape, and Tylenol [acetaminophen]    Review of Systems   Review of Systems Negative except as per HPI Physical Exam Updated Vital Signs BP (!) 149/86 (BP Location: Left Arm)   Pulse (!) 125   Temp 98.2 F (36.8 C) (Oral)   Resp 18   Ht 5\' 6"  (1.676 m)   Wt 98 kg   LMP 08/08/2022 (Approximate)   SpO2 100%   BMI 34.86 kg/m   Physical Exam Vitals and nursing note reviewed.  Constitutional:      General: She is not in acute distress.    Appearance: She is well-developed. She is not diaphoretic.  HENT:     Head: Normocephalic and atraumatic.  Cardiovascular:     Pulses: Normal pulses.  Pulmonary:     Effort: Pulmonary effort is normal.  Musculoskeletal:        General: Tenderness present. No swelling or deformity. Normal range of motion.     Right hip: Normal.     Left hip: Normal.     Right knee: No swelling, deformity, effusion, erythema, ecchymosis, bony tenderness or crepitus. Normal range of motion. Tenderness present over the medial joint line and patellar tendon. No LCL laxity, MCL laxity, ACL laxity or PCL laxity. Normal pulse.     Right ankle: Normal.  Skin:    General: Skin is warm and dry.     Findings: No bruising, erythema or rash.  Neurological:     Mental Status: She is alert and oriented to person, place, and time.     Sensory: No sensory deficit.     Motor: No weakness.     Gait: Gait normal.  Psychiatric:        Behavior: Behavior normal.     ED Results / Procedures / Treatments   Labs (all labs ordered are listed, but only abnormal results are displayed) Labs Reviewed - No data to display  EKG None  Radiology No results found.  Procedures Procedures  {Document cardiac monitor, telemetry assessment procedure when appropriate:1}  Medications Ordered in ED Medications - No data to display  ED Course/ Medical Decision Making/ A&P   {   Click here for ABCD2, HEART and other calculatorsREFRESH Note before signing :1}                              Medical Decision Making  ***  {Document critical care time when appropriate:1} {Document review of labs and clinical decision tools ie heart score, Chads2Vasc2 etc:1}  {Document your independent review of radiology images, and any outside records:1} {Document your discussion with family members, caretakers, and with  consultants:1} {Document social determinants of health affecting pt's care:1} {Document your decision making why or why not admission, treatments were needed:1} Final Clinical Impression(s) / ED Diagnoses Final diagnoses:  None    Rx / DC Orders ED Discharge Orders     None

## 2022-11-01 NOTE — ED Provider Notes (Signed)
[place] EMERGENCY DEPARTMENT AT [facility] Provider Note   CSN: [id number] Arrival date & time: 11/01/22  2234     History  Chief Complaint  Patient presents with   Knee Pain    R    [name] is a 32 y.o. female.  32 year old female with right knee pain. Patient reports pain to the front of her knee with popping in the joint. No recent or remote injuries. Patient has been icing her knee without improvement.        Home Medications Prior to Admission medications   Medication Sig Start Date End Date Taking? Authorizing Provider  albuterol (VENTOLIN HFA) 108 (90 Base) MCG/ACT inhaler Inhale 2 puffs into the lungs every 6 (six) hours as needed for wheezing or shortness of breath. 10/31/22   Massengill, Harrold Donath, MD  ARIPiprazole (ABILIFY) 2 MG tablet Take 1 tablet (2 mg total) by mouth daily for 7 days. 10/31/22 11/07/22  Massengill, Harrold Donath, MD  diclofenac Sodium (VOLTAREN) 1 % GEL Apply 2 g topically 4 (four) times daily. Patient taking differently: Apply 2 g topically 4 (four) times daily as needed. 10/19/22   Osvaldo Shipper, MD  DULoxetine (CYMBALTA) 60 MG capsule Take 1 capsule (60 mg total) by mouth daily for 7 days. 11/01/22 11/08/22  Massengill, Harrold Donath, MD  ferrous sulfate 325 (65 FE) MG tablet Take 1 tablet (325 mg total) by mouth 2 (two) times daily with a meal. 10/19/22   Osvaldo Shipper, MD  gabapentin (NEURONTIN) 300 MG capsule Take 2 capsules (600 mg total) by mouth 3 (three) times daily for 7 days. 10/31/22 11/07/22  Massengill, Harrold Donath, MD  hydrOXYzine (ATARAX) 25 MG tablet Take 1 tablet (25 mg total) by mouth 3 (three) times daily as needed for up to 7 days for anxiety. 10/31/22 11/07/22  Massengill, Harrold Donath, MD  ibuprofen (ADVIL) 600 MG tablet Take 1 tablet (600 mg total) by mouth 3 (three) times daily as needed for mild pain or moderate pain. 10/19/22   Osvaldo Shipper, MD  metoprolol tartrate (LOPRESSOR) 25 MG tablet Take 1 tablet (25 mg total) by  mouth every 12 (twelve) hours for 7 days. 10/31/22 11/07/22  Massengill, Harrold Donath, MD  mometasone-formoterol (DULERA) 200-5 MCG/ACT AERO Inhale 2 puffs into the lungs 2 (two) times daily. 10/31/22   Massengill, Harrold Donath, MD  OLANZapine (ZYPREXA) 5 MG tablet Take 3 tablets (15 mg total) by mouth at bedtime for 7 days. 10/31/22 11/07/22  Massengill, Harrold Donath, MD  OLANZapine (ZYPREXA) 5 MG tablet Take 1 tablet (5 mg total) by mouth 2 (two) times daily for 7 days. Take 1 tablet at lunch. Take 1 tablet at supper. 11/01/22 11/08/22  Massengill, Harrold Donath, MD  pantoprazole (PROTONIX) 40 MG tablet Take 1 tablet (40 mg total) by mouth daily before breakfast for 7 days. 10/31/22 11/07/22  Massengill, Harrold Donath, MD  polyethylene glycol (MIRALAX / GLYCOLAX) 17 g packet Take 17 g by mouth 2 (two) times daily as needed for mild constipation. 10/31/22   Massengill, Harrold Donath, MD  prazosin (MINIPRESS) 1 MG capsule Take 1 capsule (1 mg total) by mouth at bedtime for 7 days. 10/31/22 11/07/22  Massengill, Harrold Donath, MD  senna-docusate (SENOKOT-S) 8.6-50 MG tablet Take 1 tablet by mouth 2 (two) times daily as needed for moderate constipation. Patient taking differently: Take 1 tablet by mouth daily. 10/19/22   Osvaldo Shipper, MD      Allergies    Bee venom, Coconut flavor, Fish allergy, Geodon [ziprasidone hcl], Haloperidol and related, Lithobid [lithium], Roxicodone [  oxycodone], Seroquel [quetiapine], Shellfish allergy, Phenergan [promethazine hcl], Prilosec [omeprazole], Sulfa antibiotics, Tegretol [carbamazepine], Prozac [fluoxetine], Tape, and Tylenol [acetaminophen]    Review of Systems   Review of Systems Negative except as per HPI Physical Exam Updated Vital Signs BP (!) 149/86 (BP Location: Left Arm)   Pulse (!) 125   Temp 98.2 F (36.8 C) (Oral)   Resp 18   Ht 5\' 6"  (1.676 m)   Wt 98 kg   LMP 08/08/2022 (Approximate)   SpO2 100%   BMI 34.86 kg/m  Physical Exam Vitals and nursing note reviewed.  Constitutional:       General: She is not in acute distress.    Appearance: She is well-developed. She is not diaphoretic.  HENT:     Head: Normocephalic and atraumatic.  Cardiovascular:     Pulses: Normal pulses.  Pulmonary:     Effort: Pulmonary effort is normal.  Musculoskeletal:        General: Tenderness present. No swelling or deformity. Normal range of motion.     Right hip: Normal.     Left hip: Normal.     Right knee: No swelling, deformity, effusion, erythema, ecchymosis, bony tenderness or crepitus. Normal range of motion. Tenderness present over the medial joint line and patellar tendon. No LCL laxity, MCL laxity, ACL laxity or PCL laxity. Normal pulse.     Right ankle: Normal.  Skin:    General: Skin is warm and dry.     Findings: No bruising, erythema or rash.  Neurological:     Mental Status: She is alert and oriented to person, place, and time.     Sensory: No sensory deficit.     Motor: No weakness.     Gait: Gait normal.  Psychiatric:        Behavior: Behavior normal.     ED Results / Procedures / Treatments   Labs (all labs ordered are listed, but only abnormal results are displayed) Labs Reviewed - No data to display  EKG None  Radiology DG Knee Complete 4 Views Right  Result Date: 11/02/2022 CLINICAL DATA:  Posterior right knee pain. EXAM: RIGHT KNEE - COMPLETE 4+ VIEW COMPARISON:  Jun 29, 2018 FINDINGS: No evidence of an acute fracture or dislocation. No evidence of arthropathy or other focal bone abnormality. A small joint effusion is suspected. IMPRESSION: Small joint effusion. Electronically Signed   By: Aram Candela M.D.   On: 11/02/2022 00:13    Procedures Procedures    Medications Ordered in ED Medications - No data to display  ED Course/ Medical Decision Making/ A&P                                 Medical Decision Making  32 year old female presents with 1 week of right knee pain with popping in the knee.  No injury.  Found to have tenderness to the  medial joint line and anterior right knee.  No laxity, no overlying erythema, is able to range knee normally.  Patient has crutches at home.  No improvement with ice alone at home.  X-ray of the right knee with questionable small effusion, agree with radiologist.  Recommend applying topical Voltaren gel as directed.  Can use ice for [place] time.  Weight-bear as tolerated and follow-up with orthopedics.        Final Clinical Impression(s) / ED Diagnoses Final diagnoses:  Effusion of right knee    Rx / DC  Orders ED Discharge Orders     None         Alden Hipp 11/02/22 0026    Nira Conn, MD 11/02/22 [phone]

## 2022-11-01 NOTE — ED Triage Notes (Signed)
Pt c/o right side knee pain x1 weeks. Sts she went to get up and hear a pop. Has been hurting ever since. Pt ambulatory in triage.

## 2022-11-02 NOTE — Discharge Instructions (Signed)
You can apply Voltaren gel to your knee as directed to pain. Use your crutches to weight bear as tolerated. Ice for 20 minutes at a time as needed. Follow up with orthopedics, call to schedule an appointment.

## 2022-11-30 ENCOUNTER — Other Ambulatory Visit: Payer: Self-pay

## 2022-11-30 ENCOUNTER — Encounter (HOSPITAL_COMMUNITY): Payer: Self-pay

## 2022-11-30 ENCOUNTER — Emergency Department (HOSPITAL_COMMUNITY)
Admission: EM | Admit: 2022-11-30 | Discharge: 2022-12-01 | Disposition: A | Discharge status: Home / Self Care | Payer: Medicare PPO | Attending: Emergency Medicine | Admitting: Emergency Medicine

## 2022-11-30 DIAGNOSIS — D649 Anemia, unspecified: Secondary | ICD-10-CM | POA: Insufficient documentation

## 2022-11-30 DIAGNOSIS — K529 Noninfective gastroenteritis and colitis, unspecified: Secondary | ICD-10-CM | POA: Diagnosis not present

## 2022-11-30 DIAGNOSIS — R197 Diarrhea, unspecified: Secondary | ICD-10-CM | POA: Diagnosis present

## 2022-11-30 MED ORDER — SODIUM CHLORIDE 0.9 % IV BOLUS
1000.0000 mL | Freq: Once | INTRAVENOUS | Status: AC
  Administered 2022-12-01: 1000 mL via INTRAVENOUS

## 2022-11-30 NOTE — ED Triage Notes (Signed)
She hs had 2 to 8 bloody stools in 24hr period, started last night, started propafenone yesterday and unsure if this caused the bloody stools. She is ambulatory into the ER with medic and to the room. She has no been in contact with the MD who ordred the medications. She has some abd cramping as well.   Medic vitals   132 palp 90hr 20rr 99%ra

## 2022-11-30 NOTE — ED Provider Notes (Signed)
[place] EMERGENCY DEPARTMENT AT [facility] Provider Note   CSN: [id number] Arrival date & time: 11/30/22  2200     History {Add pertinent medical, surgical, social history, OB history to HPI:1} Chief Complaint  Patient presents with   Blood In Stools    [name] is a 32 y.o. female.  HPI     Home Medications Prior to Admission medications   Medication Sig Start Date End Date Taking? Authorizing Provider  albuterol (VENTOLIN HFA) 108 (90 Base) MCG/ACT inhaler Inhale 2 puffs into the lungs every 6 (six) hours as needed for wheezing or shortness of breath. 10/31/22   Massengill, Harrold Donath, MD  ARIPiprazole (ABILIFY) 2 MG tablet Take 1 tablet (2 mg total) by mouth daily for 7 days. 10/31/22 11/07/22  Massengill, Harrold Donath, MD  diclofenac Sodium (VOLTAREN) 1 % GEL Apply 2 g topically 4 (four) times daily. Patient taking differently: Apply 2 g topically 4 (four) times daily as needed. 10/19/22   Osvaldo Shipper, MD  DULoxetine (CYMBALTA) 60 MG capsule Take 1 capsule (60 mg total) by mouth daily for 7 days. 11/01/22 11/08/22  Massengill, Harrold Donath, MD  ferrous sulfate 325 (65 FE) MG tablet Take 1 tablet (325 mg total) by mouth 2 (two) times daily with a meal. 10/19/22   Osvaldo Shipper, MD  gabapentin (NEURONTIN) 300 MG capsule Take 2 capsules (600 mg total) by mouth 3 (three) times daily for 7 days. 10/31/22 11/07/22  Massengill, Harrold Donath, MD  ibuprofen (ADVIL) 600 MG tablet Take 1 tablet (600 mg total) by mouth 3 (three) times daily as needed for mild pain or moderate pain. 10/19/22   Osvaldo Shipper, MD  metoprolol tartrate (LOPRESSOR) 25 MG tablet Take 1 tablet (25 mg total) by mouth every 12 (twelve) hours for 7 days. 10/31/22 11/07/22  Massengill, Harrold Donath, MD  mometasone-formoterol (DULERA) 200-5 MCG/ACT AERO Inhale 2 puffs into the lungs 2 (two) times daily. 10/31/22   Massengill, Harrold Donath, MD  OLANZapine (ZYPREXA) 5 MG tablet Take 3 tablets (15 mg total) by mouth at bedtime for 7  days. 10/31/22 11/07/22  Massengill, Harrold Donath, MD  OLANZapine (ZYPREXA) 5 MG tablet Take 1 tablet (5 mg total) by mouth 2 (two) times daily for 7 days. Take 1 tablet at lunch. Take 1 tablet at supper. 11/01/22 11/08/22  Massengill, Harrold Donath, MD  pantoprazole (PROTONIX) 40 MG tablet Take 1 tablet (40 mg total) by mouth daily before breakfast for 7 days. 10/31/22 11/07/22  Massengill, Harrold Donath, MD  polyethylene glycol (MIRALAX / GLYCOLAX) 17 g packet Take 17 g by mouth 2 (two) times daily as needed for mild constipation. 10/31/22   Massengill, Harrold Donath, MD  prazosin (MINIPRESS) 1 MG capsule Take 1 capsule (1 mg total) by mouth at bedtime for 7 days. 10/31/22 11/07/22  Massengill, Harrold Donath, MD  senna-docusate (SENOKOT-S) 8.6-50 MG tablet Take 1 tablet by mouth 2 (two) times daily as needed for moderate constipation. Patient taking differently: Take 1 tablet by mouth daily. 10/19/22   Osvaldo Shipper, MD      Allergies    Bee venom, Coconut flavor, Fish allergy, Geodon [ziprasidone hcl], Haloperidol and related, Lithobid [lithium], Roxicodone [oxycodone], Seroquel [quetiapine], Shellfish allergy, Phenergan [promethazine hcl], Prilosec [omeprazole], Sulfa antibiotics, Tegretol [carbamazepine], Prozac [fluoxetine], Tape, and Tylenol [acetaminophen]    Review of Systems   Review of Systems  Physical Exam Updated Vital Signs BP (!) 117/56   Pulse (!) 116   Temp 98.7 F (37.1 C) (Oral)   Resp 19   Ht 5\' 6"  (1.676 m)  Wt 114.3 kg   SpO2 99%   BMI 40.67 kg/m  Physical Exam  ED Results / Procedures / Treatments   Labs (all labs ordered are listed, but only abnormal results are displayed) Labs Reviewed - No data to display  EKG None  Radiology No results found.  Procedures Procedures  {Document cardiac monitor, telemetry assessment procedure when appropriate:1}  Medications Ordered in ED Medications - No data to display  ED Course/ Medical Decision Making/ A&P   {   Click here for ABCD2, HEART and  other calculatorsREFRESH Note before signing :1}                              Medical Decision Making  ***  {Document critical care time when appropriate:1} {Document review of labs and clinical decision tools ie heart score, Chads2Vasc2 etc:1}  {Document your independent review of radiology images, and any outside records:1} {Document your discussion with family members, caretakers, and with consultants:1} {Document social determinants of health affecting pt's care:1} {Document your decision making why or why not admission, treatments were needed:1} Final Clinical Impression(s) / ED Diagnoses Final diagnoses:  None    Rx / DC Orders ED Discharge Orders     None

## 2022-12-01 ENCOUNTER — Emergency Department (HOSPITAL_COMMUNITY): Payer: Medicare PPO

## 2022-12-01 LAB — URINALYSIS, ROUTINE W REFLEX MICROSCOPIC
Bilirubin Urine: NEGATIVE
Glucose, UA: NEGATIVE mg/dL
Hgb urine dipstick: NEGATIVE
Ketones, ur: NEGATIVE mg/dL
Leukocytes,Ua: NEGATIVE
Nitrite: NEGATIVE
Protein, ur: NEGATIVE mg/dL
Specific Gravity, Urine: 1.013 (ref 1.005–1.030)
pH: 7 (ref 5.0–8.0)

## 2022-12-01 LAB — COMPREHENSIVE METABOLIC PANEL
ALT: 16 U/L (ref 0–44)
AST: 18 U/L (ref 15–41)
Albumin: 3.3 g/dL — ABNORMAL LOW (ref 3.5–5.0)
Alkaline Phosphatase: 70 U/L (ref 38–126)
Anion gap: 12 (ref 5–15)
BUN: 8 mg/dL (ref 6–20)
CO2: 21 mmol/L — ABNORMAL LOW (ref 22–32)
Calcium: 8.7 mg/dL — ABNORMAL LOW (ref 8.9–10.3)
Chloride: 104 mmol/L (ref 98–111)
Creatinine, Ser: 0.75 mg/dL (ref 0.44–1.00)
GFR, Estimated: >60 mL/min (ref >60)
Glucose, Bld: 122 mg/dL — ABNORMAL HIGH (ref 70–99)
Potassium: 3.6 mmol/L (ref 3.5–5.1)
Sodium: 137 mmol/L (ref 135–145)
Total Bilirubin: 0.2 mg/dL — ABNORMAL LOW (ref 0.3–1.2)
Total Protein: 6.4 g/dL — ABNORMAL LOW (ref 6.5–8.1)

## 2022-12-01 LAB — CBC WITH DIFFERENTIAL/PLATELET
Abs Immature Granulocytes: 0.04 10*3/uL (ref 0.00–0.07)
Basophils Absolute: 0 10*3/uL (ref 0.0–0.1)
Basophils Relative: 0 %
Eosinophils Absolute: 0.1 10*3/uL (ref 0.0–0.5)
Eosinophils Relative: 1 %
HCT: 34.3 % — ABNORMAL LOW (ref 36.0–46.0)
Hemoglobin: 10.7 g/dL — ABNORMAL LOW (ref 12.0–15.0)
Immature Granulocytes: 0 %
Lymphocytes Relative: 29 %
Lymphs Abs: 2.6 10*3/uL (ref 0.7–4.0)
MCH: 25.7 pg — ABNORMAL LOW (ref 26.0–34.0)
MCHC: 31.2 g/dL (ref 30.0–36.0)
MCV: 82.5 fL (ref 80.0–100.0)
Monocytes Absolute: 0.7 10*3/uL (ref 0.1–1.0)
Monocytes Relative: 7 %
Neutro Abs: 5.6 10*3/uL (ref 1.7–7.7)
Neutrophils Relative %: 63 %
Platelets: 263 10*3/uL (ref 150–400)
RBC: 4.16 MIL/uL (ref 3.87–5.11)
RDW: 14.2 % (ref 11.5–15.5)
WBC: 9 10*3/uL (ref 4.0–10.5)
nRBC: 0 % (ref 0.0–0.2)

## 2022-12-01 LAB — HCG, SERUM, QUALITATIVE: Preg, Serum: NEGATIVE

## 2022-12-01 LAB — I-STAT CHEM 8, ED
BUN: 8 mg/dL (ref 6–20)
Calcium, Ion: 1.13 mmol/L — ABNORMAL LOW (ref 1.15–1.40)
Chloride: 104 mmol/L (ref 98–111)
Creatinine, Ser: 0.7 mg/dL (ref 0.44–1.00)
Glucose, Bld: 122 mg/dL — ABNORMAL HIGH (ref 70–99)
HCT: 32 % — ABNORMAL LOW (ref 36.0–46.0)
Hemoglobin: 10.9 g/dL — ABNORMAL LOW (ref 12.0–15.0)
Potassium: 4.1 mmol/L (ref 3.5–5.1)
Sodium: 140 mmol/L (ref 135–145)
TCO2: 25 mmol/L (ref 22–32)

## 2022-12-01 LAB — LIPASE, BLOOD: Lipase: 27 U/L (ref 11–51)

## 2022-12-01 MED ORDER — IOHEXOL 350 MG/ML SOLN
75.0000 mL | Freq: Once | INTRAVENOUS | Status: AC | PRN
  Administered 2022-12-01: 75 mL via INTRAVENOUS

## 2022-12-01 NOTE — Discharge Instructions (Signed)
Call the gastroenterologist listed below. You may use immodium as needed for diarrhea. Return to the ER with any new severe symptoms

## 2022-12-05 ENCOUNTER — Encounter (HOSPITAL_COMMUNITY): Payer: Self-pay | Admitting: Psychiatry

## 2022-12-05 ENCOUNTER — Emergency Department (HOSPITAL_COMMUNITY)
Admission: EM | Admit: 2022-12-05 | Discharge: 2022-12-06 | Disposition: A | Discharge status: Other Acute Inpatient Hospital | Payer: Medicare PPO | Attending: Emergency Medicine | Admitting: Emergency Medicine

## 2022-12-05 ENCOUNTER — Other Ambulatory Visit: Payer: Self-pay

## 2022-12-05 DIAGNOSIS — F4310 Post-traumatic stress disorder, unspecified: Secondary | ICD-10-CM | POA: Insufficient documentation

## 2022-12-05 DIAGNOSIS — X838XXA Intentional self-harm by other specified means, initial encounter: Secondary | ICD-10-CM | POA: Insufficient documentation

## 2022-12-05 DIAGNOSIS — T465X2A Poisoning by other antihypertensive drugs, intentional self-harm, initial encounter: Secondary | ICD-10-CM | POA: Diagnosis not present

## 2022-12-05 DIAGNOSIS — T50902A Poisoning by unspecified drugs, medicaments and biological substances, intentional self-harm, initial encounter: Secondary | ICD-10-CM | POA: Diagnosis present

## 2022-12-05 DIAGNOSIS — Z7951 Long term (current) use of inhaled steroids: Secondary | ICD-10-CM | POA: Insufficient documentation

## 2022-12-05 DIAGNOSIS — F840 Autistic disorder: Secondary | ICD-10-CM | POA: Insufficient documentation

## 2022-12-05 DIAGNOSIS — T462X2A Poisoning by other antidysrhythmic drugs, intentional self-harm, initial encounter: Secondary | ICD-10-CM | POA: Diagnosis not present

## 2022-12-05 DIAGNOSIS — F411 Generalized anxiety disorder: Secondary | ICD-10-CM | POA: Insufficient documentation

## 2022-12-05 DIAGNOSIS — J45909 Unspecified asthma, uncomplicated: Secondary | ICD-10-CM | POA: Insufficient documentation

## 2022-12-05 DIAGNOSIS — F250 Schizoaffective disorder, bipolar type: Secondary | ICD-10-CM | POA: Diagnosis not present

## 2022-12-05 DIAGNOSIS — T471X2A Poisoning by other antacids and anti-gastric-secretion drugs, intentional self-harm, initial encounter: Secondary | ICD-10-CM | POA: Insufficient documentation

## 2022-12-05 DIAGNOSIS — T1491XA Suicide attempt, initial encounter: Secondary | ICD-10-CM

## 2022-12-05 DIAGNOSIS — T426X2A Poisoning by other antiepileptic and sedative-hypnotic drugs, intentional self-harm, initial encounter: Secondary | ICD-10-CM | POA: Insufficient documentation

## 2022-12-05 DIAGNOSIS — F603 Borderline personality disorder: Secondary | ICD-10-CM | POA: Diagnosis present

## 2022-12-05 DIAGNOSIS — T450X2A Poisoning by antiallergic and antiemetic drugs, intentional self-harm, initial encounter: Secondary | ICD-10-CM | POA: Insufficient documentation

## 2022-12-05 LAB — RAPID URINE DRUG SCREEN, HOSP PERFORMED
Amphetamines: NOT DETECTED
Barbiturates: NOT DETECTED
Benzodiazepines: NOT DETECTED
Cocaine: NOT DETECTED
Opiates: NOT DETECTED
Tetrahydrocannabinol: NOT DETECTED

## 2022-12-05 LAB — COMPREHENSIVE METABOLIC PANEL
ALT: 14 U/L (ref 0–44)
AST: 15 U/L (ref 15–41)
Albumin: 3.3 g/dL — ABNORMAL LOW (ref 3.5–5.0)
Alkaline Phosphatase: 65 U/L (ref 38–126)
Anion gap: 11 (ref 5–15)
BUN: 7 mg/dL (ref 6–20)
CO2: 18 mmol/L — ABNORMAL LOW (ref 22–32)
Calcium: 8.7 mg/dL — ABNORMAL LOW (ref 8.9–10.3)
Chloride: 108 mmol/L (ref 98–111)
Creatinine, Ser: 0.79 mg/dL (ref 0.44–1.00)
GFR, Estimated: >60 mL/min (ref >60)
Glucose, Bld: 114 mg/dL — ABNORMAL HIGH (ref 70–99)
Potassium: 3.7 mmol/L (ref 3.5–5.1)
Sodium: 137 mmol/L (ref 135–145)
Total Bilirubin: 0.4 mg/dL (ref 0.3–1.2)
Total Protein: 6.5 g/dL (ref 6.5–8.1)

## 2022-12-05 LAB — CBC WITH DIFFERENTIAL/PLATELET
Abs Immature Granulocytes: 0.03 10*3/uL (ref 0.00–0.07)
Basophils Absolute: 0 10*3/uL (ref 0.0–0.1)
Basophils Relative: 0 %
Eosinophils Absolute: 0.1 10*3/uL (ref 0.0–0.5)
Eosinophils Relative: 1 %
HCT: 35 % — ABNORMAL LOW (ref 36.0–46.0)
Hemoglobin: 10.8 g/dL — ABNORMAL LOW (ref 12.0–15.0)
Immature Granulocytes: 0 %
Lymphocytes Relative: 28 %
Lymphs Abs: 1.9 10*3/uL (ref 0.7–4.0)
MCH: 25.2 pg — ABNORMAL LOW (ref 26.0–34.0)
MCHC: 30.9 g/dL (ref 30.0–36.0)
MCV: 81.8 fL (ref 80.0–100.0)
Monocytes Absolute: 0.5 10*3/uL (ref 0.1–1.0)
Monocytes Relative: 7 %
Neutro Abs: 4.3 10*3/uL (ref 1.7–7.7)
Neutrophils Relative %: 64 %
Platelets: 258 10*3/uL (ref 150–400)
RBC: 4.28 MIL/uL (ref 3.87–5.11)
RDW: 14 % (ref 11.5–15.5)
WBC: 6.8 10*3/uL (ref 4.0–10.5)
nRBC: 0 % (ref 0.0–0.2)

## 2022-12-05 LAB — ETHANOL: Alcohol, Ethyl (B): <10 mg/dL (ref <10)

## 2022-12-05 LAB — SALICYLATE LEVEL: Salicylate Lvl: <7 mg/dL — ABNORMAL LOW (ref 7.0–30.0)

## 2022-12-05 LAB — HCG, SERUM, QUALITATIVE: Preg, Serum: NEGATIVE

## 2022-12-05 LAB — CBG MONITORING, ED: Glucose-Capillary: 107 mg/dL — ABNORMAL HIGH (ref 70–99)

## 2022-12-05 LAB — ACETAMINOPHEN LEVEL
Acetaminophen (Tylenol), Serum: <10 ug/mL — ABNORMAL LOW (ref 10–30)
Acetaminophen (Tylenol), Serum: <10 ug/mL — ABNORMAL LOW (ref 10–30)

## 2022-12-05 MED ORDER — OLANZAPINE 10 MG PO TABS: 15.0000 mg | ORAL_TABLET | Freq: Every day | ORAL | Status: DC

## 2022-12-05 MED ORDER — GABAPENTIN 300 MG PO CAPS
600.0000 mg | ORAL_CAPSULE | Freq: Three times a day (TID) | ORAL | Status: DC
  Administered 2022-12-05 – 2022-12-06 (×3): 600 mg via ORAL
  Filled 2022-12-05 (×3): qty 2

## 2022-12-05 MED ORDER — OLANZAPINE 10 MG PO TABS
5.0000 mg | ORAL_TABLET | Freq: Two times a day (BID) | ORAL | Status: DC
  Administered 2022-12-05 – 2022-12-06 (×2): 5 mg via ORAL
  Filled 2022-12-05 (×2): qty 1

## 2022-12-05 MED ORDER — METOPROLOL TARTRATE 5 MG/5ML IV SOLN
5.0000 mg | Freq: Once | INTRAVENOUS | Status: AC
  Administered 2022-12-05: 5 mg via INTRAVENOUS
  Filled 2022-12-05: qty 5

## 2022-12-05 MED ORDER — ARIPIPRAZOLE 2 MG PO TABS
2.0000 mg | ORAL_TABLET | Freq: Every day | ORAL | Status: DC
  Administered 2022-12-06: 2 mg via ORAL
  Filled 2022-12-05 (×2): qty 1

## 2022-12-05 MED ORDER — PRAZOSIN HCL 1 MG PO CAPS
1.0000 mg | ORAL_CAPSULE | Freq: Every day | ORAL | Status: DC
  Administered 2022-12-05: 1 mg via ORAL
  Filled 2022-12-05 (×2): qty 1

## 2022-12-05 MED ORDER — DULOXETINE HCL 60 MG PO CPEP
60.0000 mg | ORAL_CAPSULE | Freq: Every day | ORAL | Status: DC
  Administered 2022-12-05 – 2022-12-06 (×2): 60 mg via ORAL
  Filled 2022-12-05 (×2): qty 1

## 2022-12-05 MED ORDER — GABAPENTIN 300 MG PO CAPS: 300.0000 mg | ORAL_CAPSULE | Freq: Three times a day (TID) | ORAL | Status: DC

## 2022-12-05 MED ORDER — OLANZAPINE 10 MG PO TABS
15.0000 mg | ORAL_TABLET | Freq: Every day | ORAL | Status: DC
  Administered 2022-12-05: 15 mg via ORAL
  Filled 2022-12-05: qty 2

## 2022-12-05 MED ORDER — SODIUM CHLORIDE 0.9 % IV BOLUS
1000.0000 mL | Freq: Once | INTRAVENOUS | Status: AC
Start: 2022-12-05 — End: 2022-12-05
  Administered 2022-12-05: 1000 mL via INTRAVENOUS

## 2022-12-05 NOTE — ED Notes (Signed)
Pt refused to sign personal belongings sheet. A second nurse signature for witness on form

## 2022-12-05 NOTE — ED Notes (Signed)
NAD noted at this time, pt resting in bed, respirations even and unlabored, skin warm and dry, bed in low position, call light within reach. Will continue to monitor. Sitter remains with patient. Pt has inpatient bed assignment to [facility], can arrive after 9am tomorrow.

## 2022-12-05 NOTE — ED Notes (Signed)
Spoke to Dr. Wallace Cullens regarding discontinuing fluids and administering Gatorade. She agreed. Given gatorade.

## 2022-12-05 NOTE — ED Notes (Signed)
Spoke to [facility] NP. He said this Pt can wait for TTS.

## 2022-12-05 NOTE — Progress Notes (Signed)
LCSW Progress Note  [id number]   [name]  12/05/2022  7:20 PM    Inpatient Behavioral Health Placement  Pt meets inpatient criteria per Arsenio Loader, NP. There are no available beds within [facility] per [facility] AC Rona Ravens, RN. Referral was sent to the following facilities;   Destination  Service Provider Address Phone [facility] [place]  [address] [place], [place] [address] [phone]  [facility]  [address] [place] [address] [phone]  [facility]  [place] [address] [phone]  [facility]  [address] [place] [address] [phone]  [facility] [address]  [address] [place] [address] [phone]  [facility]  [address] [place], [place] [address] [phone]  [name]  [address], [place] [address] [phone]  [facility]  [address] [place] [address] [phone]  [facility]  [address] [place], [place] [address] [phone]  [facility]  [address] [place] [address] [phone]  [facility]  [address] [place] [address] [phone]  [facility]  [address], [place] [address] [phone]  [facility]  [address], [place] [address] [phone]  [facility]  [address], [place] [address] [phone]  [facility] EFAX  [address] [place], [place] [phone]  [facility]  [address]  [place] [address] [phone]  [facility]  [name] [place] [address] [phone]  [facility]  [address], [place] [address] [phone]  [facility]  [address], [place] [address] [phone]  [facility]  [place] [phone]  [facility]  [address] [place] [address] [phone]  [facility]  [address], [place] [address] [phone]  [facility]  [address] [place] [address] [phone]    Situation ongoing,  CSW will follow up.    Maryjean Ka, MSW, LCSWA 12/05/2022 7:20 PM

## 2022-12-05 NOTE — ED Notes (Signed)
Sitter at bedside.

## 2022-12-05 NOTE — ED Notes (Signed)
Attempted to get patient to urinate but patient refused. Pt drowsy and not wanting to get up.

## 2022-12-05 NOTE — Consult Note (Cosign Needed Addendum)
BH ED ASSESSMENT   Reason for Consult: Psych Consult, "suicide attempt- OD" Referring Physician:  Nira Conn, MD  Patient Identification: [name] MRN:  [id number] ED Chief Complaint: Suicide attempt by drug overdose [facility])  Diagnosis:  Principal Problem:   Suicide attempt by drug overdose [facility]) Active Problems:   Borderline personality disorder (HCC)   Schizoaffective disorder, bipolar type (HCC)   PTSD (post-traumatic stress disorder)   GAD (generalized anxiety disorder)   ED Assessment Time Calculation: Start Time: 1500 Stop Time: 1534 Total Time in Minutes (Assessment Completion): 34   Subjective:    [name] is a 32 y.o.  Caucasian female with a past psychiatric history significant for schizoaffective disorder bipolar type, GAD, PTSD, carrier for Fragile X syndrome, ASD versus Asperger syndrome, borderline personality disorder, NSSIB (cutting), multiple suicide attempts, and multiple inpatient psychiatric hospitalizations, with pertinent medical comorbidities that include fibromyalgia and obesity, who presented this encounter by way of EMS from home after intentional overdose on prescribed medications in an attempt to end her life. Patient currently is under involuntary commitment and medically cleared at this time.  HPI:   Patient seen today at the [facility] emergency department for face-to-face psychiatric evaluation.  Upon evaluation, patient endorses that she attempted to end her life by way of overdosing on prescribed medications, due to her ACT team [facility] not dropping off refills of her medications on Monday, which caused her to not take her prescribed medications from Monday up until Tuesday night when she attempted suicide.  Expanding on this, patient endorses that she is very sensitive to missing doses of her medications, states that she takes her medications, "religiously" when they are supposed to be taken, and when she  does not take them on time, states that her mood rapidly declines and she has a recrudescence of suicidal thoughts and depression.   Patient endorses that since being in the safe and secure environment in the hospital she continues to have active suicidal thoughts of wanting to break hospital glass and cut her throat to end her life. Patient endorses that she has no psychosocial stressors in her life that are bothering her, just states that she has been feeling very depressed since Monday since missing doses of her medications.   Patient endorses that she normally and regularly sleeps and eats without problems.  Patient endorses that her medication regimen she is currently on works well for her when she takes them consistently, denies any side effects from her medications.  Patient endorses no drug use, tobacco use, and/or EtOH use past or present.  Patient endorses a history over the years of multiple suicide attempts, multiple inpatient hospitalizations for decompensation of her mental health, and multiple outpatient mental health service companies she has utilized.  Patient denied homicidal ideations, denied auditory visual hallucinations, denied paranoid ideations, and did not present with any appreciable delusional themes.  Objectively, the patient did not present appearing to be presenting with psychotic features.  Patient endorses that she has a desire for inpatient hospitalization, states that outside of the safe and secure environment in the hospital she would attempt to end her life again.  Discussed with patient restarting her regular scheduled medications, to which patient reported she was amenable to this.   Past Psychiatric History: Per [facility] hospitalization 10/20/2022 Leevy-Johnson, NP  Major Depressive Disorder recurrent severe with psychotic features, Borderline Personality Disorder, Asperger Syndrome, Schizoaffective Disorder bipolar type, PTSD, self-injurious behavior, multiple (>20)  suicide attempts, non-compliance, multiple inpatient  psychiatric hospitalizations, high ED utilization.  Adjustment disorder with mixed anxiety and depressed mood (01/31/2022), Adjustment disorder with mixed disturbance of emotions and conduct (08/03/2019), Anxiety, Asthma, Autism, Bipolar 1 disorder, depressed, severe (HCC) (07/25/2021), Carrier of fragile X syndrome, Chronic constipation, Depression, Drug-seeking behavior, Essential tremor, Headache, Ineffective individual coping (05/16/2022), Insomnia (01/12/2022), Intentional drug overdose (HCC) (06/05/2022), Neuromuscular disorder (HCC), Normocytic anemia (06/05/2022), Overdose (07/22/2017), Overdose of acetaminophen (07/2017), Overdose, intentional self-harm, initial encounter (HCC) (07/20/2021), Paranoia (HCC) (04/22/2021), Personality disorder (HCC), Purposeful non-suicidal drug ingestion (HCC) (06/27/2021), Schizo-affective psychosis (HCC), Schizoaffective disorder (HCC) (07/29/2022), Schizoaffective disorder, bipolar type (HCC), Seizures (HCC), Skin erythema (04/27/2022), Sleep apnea, Suicidal behavior (07/25/2021), Suicidal ideation, Suicide (HCC) (07/01/2021), and Suicide attempt (HCC) (07/04/2021).   Risk to Self or Others: Is the patient at risk to self? Yes Has the patient been a risk to self in the past 6 months? Yes Has the patient been a risk to self within the distant past? Yes Is the patient a risk to others? No Has the patient been a risk to others in the past 6 months? No Has the patient been a risk to others within the distant past? No  [place] Scale:  Flowsheet Row ED from 12/05/2022 in [facility] Emergency Department at [facility] ED from 11/30/2022 in [facility] Emergency Department at [facility] ED from 11/01/2022 in [facility] Emergency Department at [facility]  C-SSRS RISK CATEGORY High Risk No Risk No Risk       AIMS: 0   Substance Abuse: None reported or endorsed  Past Medical  History:  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 fragile X syndrome    Chronic constipation    Depression    Drug-seeking behavior    Essential tremor    Headache    Ineffective individual coping 05/16/2022   Insomnia 01/12/2022   Intentional drug overdose (HCC) 06/05/2022   Neuromuscular disorder (HCC)    Normocytic anemia 06/05/2022   Overdose 07/22/2017   Overdose of acetaminophen 07/2017   and other meds   Overdose, intentional self-harm, initial encounter (HCC) 07/20/2021   Paranoia (HCC) 04/22/2021   Personality disorder (HCC)    Purposeful non-suicidal drug ingestion (HCC) 06/27/2021   Schizo-affective psychosis (HCC)    Schizoaffective disorder (HCC) 07/29/2022   Schizoaffective disorder, bipolar type (HCC)    Seizures (HCC)    Last seizure December 2017   Skin erythema 04/27/2022   Sleep apnea    Suicidal behavior 07/25/2021   Suicidal ideation    Suicide (HCC) 07/01/2021   Suicide attempt (HCC) 07/04/2021    Past Surgical History:  Procedure Laterality Date   MOUTH SURGERY  2009 or 2010   Family History:  Family History  Problem Relation Age of Onset   Mental illness Father    Asthma Father    PDD Brother    Seizures Brother    Family Psychiatric  History: Per [facility] hospitalization 10/20/2022 Leevy-Johnson, NP  Psych: Father has PTSD, Mother and Brother has Autism, Brother has depression  Psych Rx: SA/HA: denies  Substance use family hx: denies   Social History:  Social History   Substance and Sexual Activity  Alcohol Use No   Alcohol/week: 1.0 standard drink of alcohol   Types: 1 Standard drinks or equivalent per week   Comment: denies at this time      Social History   Substance and Sexual Activity  Drug Use No   Comment: History of cocaine use at age 49 for 4 months    Social History   Socioeconomic History   Marital status: Widowed    Spouse name: Not on file   Number of children: 0   Years of education: Not on file   Highest education level: Not on file  Occupational History   Occupation: disability  Tobacco Use   Smoking status: Former    Types: Cigarettes   Smokeless tobacco: Never   Tobacco comments:    Smoked for 2  years age [age]  Vaping Use   Vaping status: Never Used  Substance and Sexual Activity   Alcohol use: No    Alcohol/week: 1.0 standard drink of alcohol    Types: 1 Standard drinks or equivalent per week    Comment: denies at this time   Drug use: No    Comment: History of cocaine use at age 32 for 4 months   Sexual activity: Not Currently    Birth control/protection: None  Other Topics Concern   Not on file  Social History Narrative   Marital status: Widowed      Children: daughter      Lives: with boyfriend, in two story home      Employment:  Disability      Tobacco: quit smoking; smoked for two years.      Alcohol ;none      Drugs: none   Has not traveled outside of the country.   Right handed         Patient with hx of Fibromyalgia,Orthostatic Tachycardia,Asthma,Arthritis,Gerd, ASD   Social Determinants of Health   Financial Resource Strain: Not on file  Food Insecurity: No Food Insecurity (10/19/2022)   Hunger Vital Sign    Worried About Running Out of Food in the Last Year: Never true    Ran Out of Food in the Last Year: Never true  Transportation Needs: No Transportation Needs (10/19/2022)   PRAPARE - Administrator, Civil Service (Medical): No    Lack of Transportation (Non-Medical): No  Physical Activity: Not on file  Stress: Not on file  Social Connections: Not on file   Additional Social History:    Allergies:   Allergies  Allergen Reactions   Bee  Venom Anaphylaxis   Coconut Flavor Anaphylaxis and Rash   Fish Allergy Anaphylaxis   Geodon [Ziprasidone Hcl] Other (See Comments)    Pt states that this medication causes paralysis of the mouth.     Haloperidol And Related Other (See Comments)    Pt states that this medication causes paralysis of the mouth, jaw locks up   Lithobid [Lithium] Other (See Comments)    Seizure-like activity    Roxicodone [Oxycodone] Other (See Comments)    Hallucinations    Seroquel [Quetiapine] Other (See Comments)    Severe drowsiness    Shellfish Allergy Anaphylaxis   Phenergan [Promethazine Hcl] Other (See Comments)    Chest pain     Prilosec [Omeprazole] Nausea And Vomiting and Other (See Comments)    Pt can take protonix with no problems    Sulfa Antibiotics Other (See Comments)    Chest pain    Tegretol [Carbamazepine] Nausea And Vomiting   Prozac [  Fluoxetine] Other (See Comments)    Increased Depression and Suicidal thoughts   Tape Other (See Comments)    Skin tears, can only tolerate paper tape.   Tylenol [Acetaminophen] Rash and Other (See Comments)    Rash on face     Labs:  Results for orders placed or performed during the hospital encounter of 12/05/22 (from the past 48 hour(s))  CBG monitoring, ED     Status: Abnormal   Collection Time: 12/05/22 12:49 AM  Result Value Ref Range   Glucose-Capillary 107 (H) 70 - 99 mg/dL    Comment: Glucose reference range applies only to samples taken after fasting for at least 8 hours.  Comprehensive metabolic panel     Status: Abnormal   Collection Time: 12/05/22 12:50 AM  Result Value Ref Range   Sodium 137 135 - 145 mmol/L   Potassium 3.7 3.5 - 5.1 mmol/L   Chloride 108 98 - 111 mmol/L   CO2 18 (L) 22 - 32 mmol/L   Glucose, Bld 114 (H) 70 - 99 mg/dL    Comment: Glucose reference range applies only to samples taken after fasting for at least 8 hours.   BUN 7 6 - 20 mg/dL   Creatinine, Ser 9.62 0.44 - 1.00 mg/dL   Calcium 8.7 (L) 8.9 -  10.3 mg/dL   Total Protein 6.5 6.5 - 8.1 g/dL   Albumin 3.3 (L) 3.5 - 5.0 g/dL   AST 15 15 - 41 U/L   ALT 14 0 - 44 U/L   Alkaline Phosphatase 65 38 - 126 U/L   Total Bilirubin 0.4 0.3 - 1.2 mg/dL   GFR, Estimated >95 >28 mL/min    Comment: (NOTE) Calculated using the CKD-EPI Creatinine Equation (2021)    Anion gap 11 5 - 15    Comment: Performed at [facility], [address], [place], [place] [address]  Salicylate level     Status: Abnormal   Collection Time: 12/05/22 12:50 AM  Result Value Ref Range   Salicylate Lvl <7.0 (L) 7.0 - 30.0 mg/dL    Comment: Performed at [facility], [name] [place], [place] [address]  Acetaminophen level     Status: Abnormal   Collection Time: 12/05/22 12:50 AM  Result Value Ref Range   Acetaminophen (Tylenol), Serum <10 (L) 10 - 30 ug/mL    Comment: (NOTE) Therapeutic concentrations vary significantly. A range of 10-30 ug/mL  may be an effective concentration for many patients. However, some  are best treated at concentrations outside of this range. Acetaminophen concentrations >150 ug/mL at 4 hours after ingestion  and >50 ug/mL at 12 hours after ingestion are often associated with  toxic reactions.  Performed at [facility], [address], [place], [place] [address]   Ethanol     Status: None   Collection Time: 12/05/22 12:50 AM  Result Value Ref Range   Alcohol, Ethyl (B) <10 <10 mg/dL    Comment: (NOTE) Lowest detectable limit for serum alcohol is 10 mg/dL.  For medical purposes only. Performed at [facility], [address] [place], [place] [address]   CBC WITH DIFFERENTIAL     Status: Abnormal   Collection Time: 12/05/22 12:50 AM  Result Value Ref Range   WBC 6.8 4.0 - 10.5 K/uL   RBC 4.28 3.87 - 5.11 MIL/uL   Hemoglobin 10.8 (L) 12.0 - 15.0 g/dL   HCT 47.4 (L) 25.9 - 56.3 %   MCV 81.8  80.0 - 100.0 fL   MCH 25.2 (L) 26.0 - 34.0 pg   MCHC 30.9 30.0 - 36.0 g/dL   RDW 69.6 29.5 - 28.4 %    Platelets 258 150 - 400 K/uL   nRBC 0.0 0.0 - 0.2 %   Neutrophils Relative % 64 %   Neutro Abs 4.3 1.7 - 7.7 K/uL   Lymphocytes Relative 28 %   Lymphs Abs 1.9 0.7 - 4.0 K/uL   Monocytes Relative 7 %   Monocytes Absolute 0.5 0.1 - 1.0 K/uL   Eosinophils Relative 1 %   Eosinophils Absolute 0.1 0.0 - 0.5 K/uL   Basophils Relative 0 %   Basophils Absolute 0.0 0.0 - 0.1 K/uL   Immature Granulocytes 0 %   Abs Immature Granulocytes 0.03 0.00 - 0.07 K/uL    Comment: Performed at [facility], [address], [place], [place] [address]  hCG, serum, qualitative     Status: None   Collection Time: 12/05/22 12:50 AM  Result Value Ref Range   Preg, Serum NEGATIVE NEGATIVE    Comment:        THE SENSITIVITY OF THIS METHODOLOGY IS >10 mIU/mL. Performed at [facility], [address], [place], [place] [address]   Acetaminophen level     Status: Abnormal   Collection Time: 12/05/22  2:28 AM  Result Value Ref Range   Acetaminophen (Tylenol), Serum <10 (L) 10 - 30 ug/mL    Comment: (NOTE) Therapeutic concentrations vary significantly. A range of 10-30 ug/mL  may be an effective concentration for many patients. However, some  are best treated at concentrations outside of this range. Acetaminophen concentrations >150 ug/mL at 4 hours after ingestion  and >50 ug/mL at 12 hours after ingestion are often associated with  toxic reactions.  Performed at [facility], [address], [place], [place] [address]     No current facility-administered medications for this encounter.   Current Outpatient Medications  Medication Sig Dispense Refill   albuterol (VENTOLIN HFA) 108 (90 Base) MCG/ACT inhaler Inhale 2 puffs into the lungs every 6 (six) hours as needed for wheezing or shortness of breath. 1 each 0   ARIPiprazole (ABILIFY) 2 MG tablet Take 1 tablet (2 mg total) by mouth daily for 7 days. 7 tablet 0   cyclobenzaprine (FLEXERIL) 10 MG tablet Take 10 mg by mouth at bedtime.      diclofenac Sodium (VOLTAREN) 1 % GEL Apply 2 g topically 4 (four) times daily. (Patient taking differently: Apply 2 g topically in the morning, at noon, and at bedtime.)     DULoxetine (CYMBALTA) 60 MG capsule Take 1 capsule (60 mg total) by mouth daily for 7 days. 7 capsule 0   gabapentin (NEURONTIN) 600 MG tablet Take 600 mg by mouth 3 (three) times daily.     hydrOXYzine (ATARAX) 25 MG tablet Take 25 mg by mouth 3 (three) times daily.     ibuprofen (ADVIL) 600 MG tablet Take 1 tablet (600 mg total) by mouth 3 (three) times daily as needed for mild pain or moderate pain.     metoprolol tartrate (LOPRESSOR) 25 MG tablet Take 1 tablet (25 mg total) by mouth every 12 (twelve) hours for 7 days. 14 tablet 0   mometasone-formoterol (DULERA) 200-5 MCG/ACT AERO Inhale 2 puffs into the lungs 2 (two) times daily. 1 each 0   OLANZapine (ZYPREXA) 15 MG tablet Take 15 mg by mouth at bedtime.     OLANZapine (ZYPREXA)  5 MG tablet Take 3 tablets (15 mg total) by mouth at bedtime for 7 days. (Patient taking differently: Take 5 mg by mouth in the morning and at bedtime. Take 1 tablet at 1200 and 1700) 21 tablet 0   pantoprazole (PROTONIX) 40 MG tablet Take 1 tablet (40 mg total) by mouth daily before breakfast for 7 days. 7 tablet 0   prazosin (MINIPRESS) 1 MG capsule Take 1 capsule (1 mg total) by mouth at bedtime for 7 days. 7 capsule 0   propafenone (RYTHMOL SR) 225 MG 12 hr capsule Take 225 mg by mouth 2 (two) times daily.     ferrous sulfate 325 (65 FE) MG tablet Take 1 tablet (325 mg total) by mouth 2 (two) times daily with a meal. (Patient not taking: Reported on 12/05/2022)     polyethylene glycol (MIRALAX / GLYCOLAX) 17 g packet Take 17 g by mouth 2 (two) times daily as needed for mild constipation. (Patient not taking: Reported on 12/05/2022)     senna-docusate (SENOKOT-S) 8.6-50 MG tablet Take 1 tablet by mouth 2 (two) times daily as needed for moderate constipation. (Patient not taking: Reported on  12/05/2022)      Musculoskeletal: Strength & Muscle Tone: within normal limits Gait & Station: normal Patient leans: N/A   Psychiatric Specialty Exam: Presentation  General Appearance:  Appropriate for Environment  Eye Contact: Good  Speech: Clear and Coherent; Normal Rate  Speech Volume: Normal  Handedness: Right   Mood and Affect  Mood: Depressed  Affect: Appropriate   Thought Process  Thought Processes: Linear; Goal Directed; Coherent  Descriptions of Associations:Intact  Orientation:Full (Time, Place and Person)  Thought Content:Logical  History of Schizophrenia/Schizoaffective disorder:No  Duration of Psychotic Symptoms:Greater than six months  Hallucinations:Hallucinations: None  Ideas of Reference:None  Suicidal Thoughts:Suicidal Thoughts: Yes, Active SI Active Intent and/or Plan: With Intent; With Plan; With Means to Carry Out; With Access to Means (Wants to break glass of hospital window and cut her throat)  Homicidal Thoughts:Homicidal Thoughts: No   Sensorium  Memory: Immediate Good; Recent Good; Remote Good  Judgment: Intact  Insight: Present   Executive Functions  Concentration: Fair  Attention Span: Fair  Recall: [employer] of Knowledge: Fair  Language: Fair   Psychomotor Activity  Psychomotor Activity: Psychomotor Activity: Normal   Assets  Assets: Housing; Health and safety inspector; Desire for Improvement; Communication Skills; Transportation; Leisure Time; Physical Health; Resilience; Social Support; Talents/Skills    Sleep  Sleep: Sleep: Fair   Physical Exam: Physical Exam Vitals and nursing note reviewed.  Constitutional:      General: She is not in acute distress.    Appearance: She is obese. She is not ill-appearing, toxic-appearing or diaphoretic.  Pulmonary:     Effort: Pulmonary effort is normal.  Skin:    General: Skin is warm and dry.  Neurological:     Mental Status: She is  alert and oriented to person, place, and time.  Psychiatric:        Attention and Perception: Attention and perception normal.        Mood and Affect: Affect normal. Mood is depressed.        Speech: Speech normal.        Behavior: Behavior is cooperative.        Thought Content: Thought content includes suicidal (Reports active thoughts of wanting to break hospital window glass and cut her throat) ideation. Thought content includes suicidal plan.        Cognition and Memory:  Cognition and memory normal.        Judgment: Judgment normal.    Review of Systems  Psychiatric/Behavioral:  Positive for depression and suicidal ideas. Negative for hallucinations and substance abuse. The patient is not nervous/anxious.   All other systems reviewed and are negative.  Blood pressure 122/63, pulse (!) 108, temperature 98.1 F (36.7 C), temperature source Oral, resp. rate 18, SpO2 98%. There is no height or weight on file to calculate BMI.  Medical Decision Making:  Patient presented this encounter by way of EMS from home after intentional overdose on prescribed medications in an attempt to end her life.  Upon evaluation, patient endorses continued depressive mood with active thoughts of wanting to commit suicide, thus the recommendation at this time is for inpatient hospitalization of the patient for safety and further stability.  [facility] to review, as well as CSW team to fax patient out, and psychiatry will continue to follow the patient until disposition is obtained.  Recommendations  #Suicide attempt by drug overdose (HCC) #Borderline personality disorder (HCC) #Schizoaffective disorder, bipolar type (HCC) #PTSD (post-traumatic stress disorder) #GAD (generalized anxiety disorder)  -Recommend inpatient hospitalization for mental health -Recommend restart outpatient medications -Recommend safety precautions -Recommend upholding IVC  Disposition: Recommend psychiatric Inpatient admission when  medically cleared.  Lenox Ponds, NP 12/05/2022 5:34 PM

## 2022-12-05 NOTE — ED Notes (Signed)
IVC paperwork completed. Original in red folder, 1 copy in medical records, and 3 copies attached to clipboard in orange zone. IVC Case number: [id number] EXP:12-12-22

## 2022-12-05 NOTE — ED Notes (Signed)
PATIENT MOVE FROM ORANGE ZONE TO YELLOW ZONE 3 COPIES HAVE BEEN PLACED ON CLIPBOARD IN BLUE ZONE

## 2022-12-05 NOTE — ED Notes (Signed)
Called staffing for sitter, non available. Staffing stated they will send one when available.

## 2022-12-05 NOTE — ED Provider Notes (Signed)
[place] EMERGENCY DEPARTMENT AT [facility] Provider Note  CSN: [id number] Arrival date & time: 12/05/22 0004  Chief Complaint(s) Suicide Attempt Pt bibgcems from home for intentional overdose. Meds taken per patient  vistaril- 7 pills unknown dosage Propafenone 225 mg- 12 tablets  Gabapentin 600mg - 7 tablets Zyprexa 5mg - 12 tablets  Protonix 40 mg- 7 tablets Metoprolol25mg -- 7 tablets   HPI [name] is a 32 y.o. female here after suicide attempt by overdosing on the above medications.  Patient reports that she did this because her ACT team has not given her her Abilify or Cymbalta.  Denies any other physical complaints.  The history is provided by the patient.    Past Medical History 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 fragile X syndrome    Chronic constipation    Depression    Drug-seeking behavior    Essential tremor    Headache    Ineffective individual coping 05/16/2022   Insomnia 01/12/2022   Intentional drug overdose (HCC) 06/05/2022   Neuromuscular disorder (HCC)    Normocytic anemia 06/05/2022   Overdose 07/22/2017   Overdose of acetaminophen 07/2017   and other meds   Overdose, intentional self-harm, initial encounter (HCC) 07/20/2021   Paranoia (HCC) 04/22/2021   Personality disorder (HCC)    Purposeful non-suicidal drug ingestion (HCC) 06/27/2021   Schizo-affective psychosis (HCC)    Schizoaffective disorder (HCC) 07/29/2022   Schizoaffective disorder, bipolar type (HCC)    Seizures (HCC)    Last seizure December 2017   Skin erythema 04/27/2022   Sleep apnea    Suicidal behavior 07/25/2021   Suicidal ideation    Suicide  (HCC) 07/01/2021   Suicide attempt (HCC) 07/04/2021   Patient Active Problem List   Diagnosis Date Noted   Overdose 08/22/2022   Drug overdose 08/22/2022   Schizoaffective disorder, depressive type (HCC) 07/06/2022   Intentional overdose (HCC) 06/05/2022   Passive suicidal ideations 04/14/2022   Malingering 02/22/2022   Pain in joint, ankle and foot 01/12/2022   Suicide attempt by cutting of wrist (HCC) 10/11/2021   Fibromyalgia 10/07/2021   Self-injurious behavior 07/19/2021   Bipolar I disorder, current or most recent episode depressed, with psychotic features (HCC) 07/05/2021   Suicide attempt (HCC) 07/04/2021   GAD (generalized anxiety disorder) 04/22/2021   Intentional acetaminophen overdose (HCC)    DUB (dysfunctional uterine bleeding) 11/22/2016   Hyperprolactinemia (HCC) 08/20/2016   Seizure disorder (HCC) 08/08/2015   Migraines 07/27/2015   Asthma 04/15/2015   Schizoaffective disorder, bipolar type (HCC) 03/10/2014   PTSD (post-traumatic stress disorder) 03/10/2014   Borderline personality disorder (HCC) 10/31/2013   Asperger syndrome 06/15/2013   Home Medication(s) Prior to Admission medications   Medication Sig Start Date End Date Taking? Authorizing Provider  albuterol (VENTOLIN HFA) 108 (90 Base) MCG/ACT inhaler Inhale 2 puffs into the lungs every 6 (six) hours as needed for wheezing or shortness of breath. 10/31/22  Yes Massengill, Harrold Donath, MD  ARIPiprazole (ABILIFY) 2 MG tablet Take 1 tablet (2 mg total) by mouth daily for 7 days. 10/31/22 12/05/23 Yes Massengill, Harrold Donath, MD  cyclobenzaprine (FLEXERIL) 10 MG tablet Take 10 mg by mouth at  bedtime.   Yes [provider]  diclofenac Sodium (VOLTAREN) 1 % GEL Apply 2 g topically 4 (four) times daily. Patient taking differently: Apply 2 g topically in the morning, at noon, and at bedtime. 10/19/22  Yes Osvaldo Shipper, MD  DULoxetine (CYMBALTA) 60 MG capsule Take 1 capsule (60 mg total) by mouth daily for 7 days.  11/01/22 12/05/23 Yes Massengill, Harrold Donath, MD  gabapentin (NEURONTIN) 600 MG tablet Take 600 mg by mouth 3 (three) times daily. 12/04/22  Yes [provider]  hydrOXYzine (ATARAX) 25 MG tablet Take 25 mg by mouth 3 (three) times daily.   Yes [provider]  ibuprofen (ADVIL) 600 MG tablet Take 1 tablet (600 mg total) by mouth 3 (three) times daily as needed for mild pain or moderate pain. 10/19/22  Yes Osvaldo Shipper, MD  metoprolol tartrate (LOPRESSOR) 25 MG tablet Take 1 tablet (25 mg total) by mouth every 12 (twelve) hours for 7 days. 10/31/22 12/05/23 Yes Massengill, Harrold Donath, MD  mometasone-formoterol (DULERA) 200-5 MCG/ACT AERO Inhale 2 puffs into the lungs 2 (two) times daily. 10/31/22  Yes Massengill, Harrold Donath, MD  OLANZapine (ZYPREXA) 15 MG tablet Take 15 mg by mouth at bedtime.   Yes [provider]  OLANZapine (ZYPREXA) 5 MG tablet Take 3 tablets (15 mg total) by mouth at bedtime for 7 days. Patient taking differently: Take 5 mg by mouth in the morning and at bedtime. Take 1 tablet at 1200 and 1700 10/31/22 12/05/23 Yes Massengill, Harrold Donath, MD  pantoprazole (PROTONIX) 40 MG tablet Take 1 tablet (40 mg total) by mouth daily before breakfast for 7 days. 10/31/22 12/05/23 Yes Massengill, Harrold Donath, MD  prazosin (MINIPRESS) 1 MG capsule Take 1 capsule (1 mg total) by mouth at bedtime for 7 days. 10/31/22 12/05/23 Yes Massengill, Harrold Donath, MD  propafenone (RYTHMOL SR) 225 MG 12 hr capsule Take 225 mg by mouth 2 (two) times daily.   Yes [provider]  ferrous sulfate 325 (65 FE) MG tablet Take 1 tablet (325 mg total) by mouth 2 (two) times daily with a meal. Patient not taking: Reported on 12/05/2022 10/19/22   Osvaldo Shipper, MD  polyethylene glycol (MIRALAX / GLYCOLAX) 17 g packet Take 17 g by mouth 2 (two) times daily as needed for mild constipation. Patient not taking: Reported on 12/05/2022 10/31/22   Phineas Inches, MD  senna-docusate (SENOKOT-S) 8.6-50 MG tablet  Take 1 tablet by mouth 2 (two) times daily as needed for moderate constipation. Patient not taking: Reported on 12/05/2022 10/19/22   Osvaldo Shipper, MD                                                                                                                                    Allergies Bee venom, Coconut flavor, Fish allergy, Geodon [ziprasidone hcl], Haloperidol and related, Lithobid [lithium], Roxicodone [oxycodone], Seroquel [quetiapine], Shellfish allergy, Phenergan [promethazine hcl], Prilosec [omeprazole], Sulfa antibiotics, Tegretol [carbamazepine], Prozac [fluoxetine], Tape, and  Tylenol [acetaminophen]  Review of Systems Review of Systems As noted in HPI  Physical Exam Vital Signs  I have reviewed the triage vital signs BP (!) 143/104 (BP Location: Right Arm)   Pulse (!) 120   Temp 98 F (36.7 C) (Oral)   Resp 19   SpO2 98%   Physical Exam Vitals reviewed.  Constitutional:      General: She is not in acute distress.    Appearance: She is well-developed. She is not diaphoretic.  HENT:     Head: Normocephalic and atraumatic.     Nose: Nose normal.  Eyes:     General: No scleral icterus.       Right eye: No discharge.        Left eye: No discharge.     Conjunctiva/sclera: Conjunctivae normal.     Pupils: Pupils are equal, round, and reactive to light.  Cardiovascular:     Rate and Rhythm: Regular rhythm. Tachycardia present.     Heart sounds: No murmur heard.    No friction rub. No gallop.  Pulmonary:     Effort: Pulmonary effort is normal. No respiratory distress.     Breath sounds: Normal breath sounds. No stridor. No rales.  Abdominal:     General: There is no distension.     Palpations: Abdomen is soft.     Tenderness: There is no abdominal tenderness.  Musculoskeletal:        General: No tenderness.     Cervical back: Normal range of motion and neck supple.  Skin:    General: Skin is warm and dry.     Findings: No erythema or rash.  Neurological:      Mental Status: She is alert and oriented to person, place, and time.  Psychiatric:        Thought Content: Thought content includes suicidal ideation.     ED Results and Treatments Labs (all labs ordered are listed, but only abnormal results are displayed) Labs Reviewed  COMPREHENSIVE METABOLIC PANEL - Abnormal; Notable for the following components:      Result Value   CO2 18 (*)    Glucose, Bld 114 (*)    Calcium 8.7 (*)    Albumin 3.3 (*)    All other components within normal limits  SALICYLATE LEVEL - Abnormal; Notable for the following components:   Salicylate Lvl <7.0 (*)    All other components within normal limits  ACETAMINOPHEN LEVEL - Abnormal; Notable for the following components:   Acetaminophen (Tylenol), Serum <10 (*)    All other components within normal limits  CBC WITH DIFFERENTIAL/PLATELET - Abnormal; Notable for the following components:   Hemoglobin 10.8 (*)    HCT 35.0 (*)    MCH 25.2 (*)    All other components within normal limits  ACETAMINOPHEN LEVEL - Abnormal; Notable for the following components:   Acetaminophen (Tylenol), Serum <10 (*)    All other components within normal limits  CBG MONITORING, ED - Abnormal; Notable for the following components:   Glucose-Capillary 107 (*)    All other components within normal limits  ETHANOL  HCG, SERUM, QUALITATIVE  RAPID URINE DRUG SCREEN, HOSP PERFORMED  EKG  EKG Interpretation Date/Time:  Wednesday December 05 2022 04:27:38 EDT Ventricular Rate:  117 PR Interval:  151 QRS Duration:  98 QT Interval:  353 QTC Calculation: 493 R Axis:   89  Text Interpretation: Sinus tachycardia Inferior infarct, old Confirmed by Drema Pry [phone]) on 12/05/2022 7:14:52 AM       Radiology No results found.  Medications Ordered in ED Medications  sodium chloride 0.9 % bolus 1,000 mL (1,000  mLs Intravenous New Bag/Given 12/05/22 0742)  metoprolol tartrate (LOPRESSOR) injection 5 mg (5 mg Intravenous Given 12/05/22 0738)   Procedures .Critical Care  Performed by: Nira Conn, MD Authorized by: Nira Conn, MD   Critical care provider statement:    Critical care time (minutes):  46   Critical care was necessary to treat or prevent imminent or life-threatening deterioration of the following conditions:  Toxidrome   Critical care was time spent personally by me on the following activities:  Development of treatment plan with patient or surrogate, discussions with consultants, evaluation of patient's response to treatment, examination of patient, ordering and review of laboratory studies, ordering and review of radiographic studies, ordering and performing treatments and interventions, pulse oximetry, re-evaluation of patient's condition and review of old charts   (including critical care time) Medical Decision Making / ED Course   Medical Decision Making Amount and/or Complexity of Data Reviewed Labs: ordered. Decision-making details documented in ED Course. ECG/medicine tests: ordered and independent interpretation performed. Decision-making details documented in ED Course.  Risk Prescription drug management. Decision regarding hospitalization.     Clinical Course as of 12/05/22 0758  Wed Dec 05, 2022  0100 Suicide attempt on above meds. QTc is prolonged from prior. Labs ordered. Plan to monitor for at least 8 hours; repeat tylenol level and EKG. IVC required. [PC]  [id number] Labs grossly reassuring.  With repeat acetaminophen level negative. Repeat EKG with improved QTc.  Discussed with poison control.  Case closed at this time.  Patient given IV fluids and dose of metoprolol for her tachycardia.  Has a history of sinus tachycardia requiring metoprolol.  Still sleepy at this time.  Will continue to monitor for the time being.  IVC paperwork has  been completed.  TTS consult ordered.   Patient care turned over to oncoming provider. Patient case and results discussed in detail; please see their note for further ED managment.   [PC]    Clinical Course User Index [PC] Fate Galanti, Amadeo Garnet, MD    Final Clinical Impression(s) / ED Diagnoses Final diagnoses:  Suicide attempt [facility])    This chart was dictated using voice recognition software.  Despite best efforts to proofread,  errors can occur which can change the documentation meaning.    Nira Conn, MD 12/05/22 [phone]

## 2022-12-05 NOTE — ED Triage Notes (Signed)
Pt bibgcems from home for intentional overdose. Meds taken per patient  vistaril- 7 pills unknown dosage Propafenone 225 mg- 12 tablets  Gabapentin 600mg - 7 tablets Zyprexa 5mg - 12 tablets  Protonix 40 mg- 7 tablets Metoprolol25mg -- 7 tablets

## 2022-12-05 NOTE — ED Provider Notes (Signed)
7:27 AM Patient signed out to me by previous ED physician. Pt is a 32 yo female with pmh ASD, bipolar 1 disorder, and adjustment disorder presenting for overdose.  Waiting on drowsiness to wear off. Then medically clear and consult TOC.    Physical Exam  BP (!) 143/104 (BP Location: Right Arm)   Pulse (!) 120   Temp 98 F (36.7 C) (Oral)   Resp 19   SpO2 98%   Physical Exam  Procedures  Procedures  ED Course / MDM   Clinical Course as of 12/05/22 1450  Wed Dec 05, 2022  0100 Suicide attempt on above meds. QTc is prolonged from prior. Labs ordered. Plan to monitor for at least 8 hours; repeat tylenol level and EKG. IVC required. [PC]  [id number] Labs grossly reassuring.  With repeat acetaminophen level negative. Repeat EKG with improved QTc.  Discussed with poison control.  Case closed at this time.  Patient given IV fluids and dose of metoprolol for her tachycardia.  Has a history of sinus tachycardia requiring metoprolol.  Still sleepy at this time.  Will continue to monitor for the time being.  IVC paperwork has been completed.  TTS consult ordered.   Patient care turned over to oncoming provider. Patient case and results discussed in detail; please see their note for further ED managment.   [PC]  1331 DC IV at this time [AG]    Clinical Course User Index [AG] Franne Forts, DO [PC] Cardama, Amadeo Garnet, MD   Medical Decision Making Amount and/or Complexity of Data Reviewed Labs: ordered.  Risk Prescription drug management.   Patient is arousable but lethargic.  Waiting TTS evaluation.  Will sign out patient to oncoming provider.       Edwin Dada P, DO 12/05/22 1450

## 2022-12-05 NOTE — ED Notes (Signed)
Items removed from room and cabinets zip tied closed.

## 2022-12-05 NOTE — Progress Notes (Signed)
Pt was accepted to [facility] 12/05/2022; Bed Assignment [facility] Fax Number: [phone] (Adult)  Pt meets inpatient criteria per Shearon Stalls    Attending Physician will be Dr. Loni Beckwith   Report can be called to:[phone]-Pager number, please leave a returned phone number to receive a phone call back.   Pt can arrive after 9:00am   Care Team notified: Day CONE [facility] AC Rona Ravens, RN, Night CONE [facility] AC Herold Harms, Day CONE [facility] [place], [place] [facility] AC Delena Bali,  Bethany Hendra,LCSW, [place], Cindy [place] Gray,DO,Melinda Bowersock,RN    Maryjean Ka, MSW, [place] 12/05/2022 9:02 PM

## 2022-12-06 DIAGNOSIS — T450X2A Poisoning by antiallergic and antiemetic drugs, intentional self-harm, initial encounter: Secondary | ICD-10-CM | POA: Diagnosis not present

## 2022-12-06 MED ORDER — LORAZEPAM 2 MG/ML IJ SOLN
2.0000 mg | Freq: Once | INTRAMUSCULAR | Status: DC
  Filled 2022-12-06: qty 1

## 2022-12-06 MED ORDER — PANTOPRAZOLE SODIUM 40 MG PO TBEC
40.0000 mg | DELAYED_RELEASE_TABLET | Freq: Once | ORAL | Status: AC
  Administered 2022-12-06: 40 mg via ORAL
  Filled 2022-12-06: qty 1

## 2022-12-06 MED ORDER — LORAZEPAM 1 MG PO TABS
2.0000 mg | ORAL_TABLET | Freq: Once | ORAL | Status: AC
  Administered 2022-12-06: 2 mg via ORAL
  Filled 2022-12-06: qty 2

## 2022-12-06 MED ORDER — IBUPROFEN 400 MG PO TABS
600.0000 mg | ORAL_TABLET | Freq: Four times a day (QID) | ORAL | Status: DC | PRN
  Administered 2022-12-06: 600 mg via ORAL
  Filled 2022-12-06: qty 1

## 2022-12-06 NOTE — ED Notes (Signed)
Pt co headache, Dr Nicanor Alcon has been made aware.

## 2022-12-06 NOTE — ED Provider Notes (Signed)
Emergency Medicine Observation Re-evaluation Note  [name] is a 32 y.o. female, seen on rounds today.  Pt initially presented to the ED for complaints of Suicide Attempt Currently, the patient is resting.  Physical Exam  BP 120/71   Pulse (!) 101   Temp 98.2 F (36.8 C)   Resp (!) 22   Ht 5\' 6"  (1.676 m)   Wt 114.3 kg   SpO2 99%   BMI 40.67 kg/m  Physical Exam General: no distress Cardiac: regular rate Lungs: equal chest rise Psych: calm  ED Course / MDM  EKG:EKG Interpretation Date/Time:  Wednesday December 05 2022 04:27:38 EDT Ventricular Rate:  117 PR Interval:  151 QRS Duration:  98 QT Interval:  353 QTC Calculation: 493 R Axis:   89  Text Interpretation: Sinus tachycardia Inferior infarct, old Confirmed by Drema Pry [phone]) on 12/05/2022 7:14:52 AM  I have reviewed the labs performed to date as well as medications administered while in observation.  Recent changes in the last 24 hours include accepted to [place] [facility].  Plan  Current plan is for transport to [facility] today.    Lonell Grandchild, MD 12/06/22 [phone]

## 2022-12-06 NOTE — ED Notes (Signed)
NAD noted at this time, pt resting in bed, respirations even and unlabored, skin warm and dry, bed in low position, call light within reach. Sitter with patient, Comfort measures offered. Pt assisted to restroom, SR remains on cardiac monitor. Pt denies any needs at this time. Will continue to monitor.

## 2022-12-06 NOTE — ED Notes (Signed)
Pt given 2mg  po Ativan for agitation. Pt is cursing and turning side to side on the stretcher. She is following commands and states she is feeling anxious about leaving.

## 2022-12-14 ENCOUNTER — Ambulatory Visit (HOSPITAL_COMMUNITY)
Admission: EM | Admit: 2022-12-14 | Discharge: 2022-12-16 | Disposition: A | Discharge status: Home / Self Care | Payer: Medicare PPO | Attending: Urology | Admitting: Urology

## 2022-12-14 DIAGNOSIS — F4310 Post-traumatic stress disorder, unspecified: Secondary | ICD-10-CM | POA: Diagnosis not present

## 2022-12-14 DIAGNOSIS — R45851 Suicidal ideations: Secondary | ICD-10-CM | POA: Insufficient documentation

## 2022-12-14 DIAGNOSIS — G8929 Other chronic pain: Secondary | ICD-10-CM | POA: Insufficient documentation

## 2022-12-14 DIAGNOSIS — F250 Schizoaffective disorder, bipolar type: Secondary | ICD-10-CM | POA: Diagnosis not present

## 2022-12-14 DIAGNOSIS — F840 Autistic disorder: Secondary | ICD-10-CM | POA: Insufficient documentation

## 2022-12-14 DIAGNOSIS — F603 Borderline personality disorder: Secondary | ICD-10-CM | POA: Diagnosis not present

## 2022-12-14 DIAGNOSIS — R5383 Other fatigue: Secondary | ICD-10-CM | POA: Diagnosis not present

## 2022-12-14 DIAGNOSIS — F329 Major depressive disorder, single episode, unspecified: Secondary | ICD-10-CM | POA: Diagnosis present

## 2022-12-14 LAB — POCT URINE DRUG SCREEN - MANUAL ENTRY (I-SCREEN)
POC Amphetamine UR: NOT DETECTED
POC Buprenorphine (BUP): NOT DETECTED
POC Cocaine UR: NOT DETECTED
POC Marijuana UR: NOT DETECTED
POC Methadone UR: NOT DETECTED
POC Methamphetamine UR: NOT DETECTED
POC Morphine: NOT DETECTED
POC Oxazepam (BZO): NOT DETECTED
POC Oxycodone UR: NOT DETECTED
POC Secobarbital (BAR): NOT DETECTED

## 2022-12-14 LAB — POC URINE PREG, ED: Preg Test, Ur: NEGATIVE

## 2022-12-14 MED ORDER — ARIPIPRAZOLE 2 MG PO TABS
2.0000 mg | ORAL_TABLET | Freq: Every day | ORAL | Status: DC
  Administered 2022-12-15 – 2022-12-16 (×2): 2 mg via ORAL
  Filled 2022-12-14 (×3): qty 1

## 2022-12-14 MED ORDER — OLANZAPINE 5 MG PO TABS
5.0000 mg | ORAL_TABLET | Freq: Two times a day (BID) | ORAL | Status: DC
  Administered 2022-12-15 (×2): 5 mg via ORAL
  Filled 2022-12-14 (×3): qty 1

## 2022-12-14 MED ORDER — ALUM & MAG HYDROXIDE-SIMETH 200-200-20 MG/5ML PO SUSP: 30.0000 mL | ORAL | Status: DC | PRN

## 2022-12-14 MED ORDER — TRAZODONE HCL 50 MG PO TABS: 50.0000 mg | ORAL_TABLET | Freq: Every evening | ORAL | Status: DC | PRN

## 2022-12-14 MED ORDER — DICLOFENAC SODIUM 1 % EX GEL
2.0000 g | Freq: Four times a day (QID) | CUTANEOUS | Status: DC | PRN
  Administered 2022-12-14 – 2022-12-15 (×2): 2 g via TOPICAL
  Filled 2022-12-14: qty 100

## 2022-12-14 MED ORDER — MAGNESIUM HYDROXIDE 400 MG/5ML PO SUSP: 30.0000 mL | Freq: Every day | ORAL | Status: DC | PRN

## 2022-12-14 MED ORDER — GABAPENTIN 300 MG PO CAPS
600.0000 mg | ORAL_CAPSULE | Freq: Three times a day (TID) | ORAL | Status: DC
  Administered 2022-12-14 – 2022-12-16 (×5): 600 mg via ORAL
  Filled 2022-12-14 (×5): qty 2

## 2022-12-14 MED ORDER — PANTOPRAZOLE SODIUM 40 MG PO TBEC
40.0000 mg | DELAYED_RELEASE_TABLET | Freq: Every day | ORAL | Status: DC
  Administered 2022-12-15 – 2022-12-16 (×2): 40 mg via ORAL
  Filled 2022-12-14 (×2): qty 1

## 2022-12-14 MED ORDER — PRAZOSIN HCL 1 MG PO CAPS
1.0000 mg | ORAL_CAPSULE | Freq: Every day | ORAL | Status: DC
  Administered 2022-12-14 – 2022-12-15 (×2): 1 mg via ORAL
  Filled 2022-12-14 (×2): qty 1

## 2022-12-14 MED ORDER — IBUPROFEN 600 MG PO TABS
600.0000 mg | ORAL_TABLET | Freq: Three times a day (TID) | ORAL | Status: DC | PRN
  Administered 2022-12-14: 600 mg via ORAL
  Filled 2022-12-14: qty 1

## 2022-12-14 MED ORDER — MOMETASONE FURO-FORMOTEROL FUM 200-5 MCG/ACT IN AERO
2.0000 | INHALATION_SPRAY | Freq: Two times a day (BID) | RESPIRATORY_TRACT | Status: DC
  Administered 2022-12-14 – 2022-12-16 (×4): 2 via RESPIRATORY_TRACT
  Filled 2022-12-14: qty 8.8

## 2022-12-14 MED ORDER — OLANZAPINE 7.5 MG PO TABS
15.0000 mg | ORAL_TABLET | Freq: Every day | ORAL | Status: DC
  Administered 2022-12-14 – 2022-12-15 (×2): 15 mg via ORAL
  Filled 2022-12-14 (×2): qty 2

## 2022-12-14 MED ORDER — DULOXETINE HCL 60 MG PO CPEP
60.0000 mg | ORAL_CAPSULE | Freq: Every day | ORAL | Status: DC
  Administered 2022-12-15 – 2022-12-16 (×2): 60 mg via ORAL
  Filled 2022-12-14 (×3): qty 1

## 2022-12-14 MED ORDER — ALBUTEROL SULFATE HFA 108 (90 BASE) MCG/ACT IN AERS: 2.0000 | INHALATION_SPRAY | Freq: Four times a day (QID) | RESPIRATORY_TRACT | Status: DC | PRN

## 2022-12-14 MED ORDER — HYDROXYZINE HCL 25 MG PO TABS
25.0000 mg | ORAL_TABLET | Freq: Three times a day (TID) | ORAL | Status: DC | PRN
  Administered 2022-12-16 (×2): 25 mg via ORAL
  Filled 2022-12-14 (×2): qty 1

## 2022-12-14 MED ORDER — CYCLOBENZAPRINE HCL 10 MG PO TABS
10.0000 mg | ORAL_TABLET | Freq: Every day | ORAL | Status: DC
  Administered 2022-12-14 – 2022-12-15 (×2): 10 mg via ORAL
  Filled 2022-12-14 (×2): qty 1

## 2022-12-14 MED ORDER — PROPAFENONE HCL ER 225 MG PO CP12
225.0000 mg | ORAL_CAPSULE | Freq: Two times a day (BID) | ORAL | Status: DC
  Administered 2022-12-14 – 2022-12-16 (×4): 225 mg via ORAL
  Filled 2022-12-14 (×3): qty 1
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
## 2022-12-14 NOTE — Progress Notes (Signed)
   12/14/22 1820  [facility] Triage Screening (Walk-ins at [facility] only)  How Did You Hear About [name]? Legal System  What Is the Reason for Your Visit/Call Today? [name] is a 32 year old female who presents to [facility] voluntarily escorted by [employer]. Per [employer] the pt called with a chief complaint of SI with a plan to take pills, she was picked up from her home where she lives with her friend. Pt states she had to urge to go through with her plan but states she decided to call for help before taking the medication. Pt reports a hx of past suicide attempts and hospitalizations that she states "it should be in the  system". Pt reports she was just discharged from [facility] last week. She reports chronic auditory hallucinations, she reports currently hearing a voice telling her "kill yourself, kill yourself". She states she had a therapy appointment with her therapist with [facility], her ACTT team, but did not feel like participating, she states her ACTT team also manages her medications. Pt denies HI and visual hallucinations currently.  How Long Has This Been Causing You Problems? <Week  Have You Recently Had Any Thoughts About Hurting Yourself? Yes  How long ago did you have thoughts about hurting yourself? take pills  Are You Planning to Commit Suicide/Harm Yourself At This time? Yes  Have you Recently Had Thoughts About Hurting Someone [name]? No  Are You Planning To Harm Someone At This Time? No  Are you currently experiencing any auditory, visual or other hallucinations? No  Have You Used Any Alcohol or Drugs in the Past 24 Hours? No  Do you have any current medical co-morbidities that require immediate attention? No  Clinician description of patient physical appearance/behavior: nervous, cooperative  What Do You Feel Would Help You the Most Today? Treatment for Depression or other mood problem  If access to [facility] was not available, would you have sought care in the Emergency  Department? No  Determination of Need Urgent (48 hours)  Options For Referral Inpatient Hospitalization;BH Urgent Care;Medication Management;Outpatient Therapy

## 2022-12-14 NOTE — ED Provider Notes (Signed)
[facility]gent [facility] Continuous Assessment Admission H&P  Date: 12/15/22 Patient Name: [name] MRN: [id number] Chief Complaint: depression, auditory hallucination, and suicidal ideation.  Diagnoses:  Final diagnoses:  Schizoaffective disorder, bipolar type (HCC)  Borderline personality disorder in adult [facility])  Autism spectrum disorder  Suicidal ideation    HPI: [name] is a 32 year old female with psychiatric history significant for borderline personality disorder, PTSD, schizoaffective disorder bipolar type, depression, and autism spectrum disorder.  Patient was brought voluntarily to [facility] by law enforcement with chief complaint of worsening depressive symptoms, auditory hallucination, and suicidal ideation.  Patient was evaluated in person and her chart was reviewed by this nurse practitioner.  Per chart, patient overdosed on 12/05/22 and was admitted to inpatient psych at [facility] upon medical clearance. Per patient, she was discharged from [facility] on 12/11/22.  Patient reports she continued to experience depressive symptoms despite recent hospitalization and compliant with her medications. She endorses depressive symptoms of hopelessness, worthlessness, crying spells, irritability, loss of interest, poor focus, feelings of guilt, fatigue, poor sleep, and poor appetite.      She identified on going mental illness, chronic pain, and not having primary custody of her daughter as her main stressor.She says she became extremely sad and started experiencing suicidal ideation while wrapping presents for her daughter this evening. Patient also reports frustration with her worsening mental health symptoms. She says she wants to be admitted to [facility] for inpatient psychiatric treatment.   She is endorsing active suicidal ideation with plan to overdose.  Patient reported that she has attempted suicide over 60 times.  She endorses history of  self-harming, she reported that she engaged in self-harming  last week by scratching her arms during hospitalization at [facility].  Patient denies homicidal ideation, paranoia, and substance use.  She endorses auditory hallucination of voices telling her to kill herself, take pills, and that she is worthless.  Patient has an ACT team through [facility]. Current medication: Gabapentin 600 mg 3 times daily Zyprexa 5 mg @12  noon and 5pm; Zyprexa 15 mg nightly Abilify 2 mg every morning Prazosin 1 mg nightly Flexeril 10 mg nightly Cymbalta 60 mg/day Protonix 40 mg/day  Total Time spent with patient: 30 minutes  Musculoskeletal  Strength & Muscle Tone: within normal limits Gait & Station: normal Patient leans: Right  Psychiatric Specialty Exam  Presentation General Appearance:  Appropriate for Environment  Eye Contact: Good  Speech: Clear and Coherent  Speech Volume: Normal  Handedness: Right   Mood and Affect  Mood: Depressed  Affect: Appropriate   Thought Process  Thought Processes: Coherent  Descriptions of Associations:Intact  Orientation:Full (Time, Place and Person)  Thought Content:WDL  Diagnosis of Schizophrenia or Schizoaffective disorder in past: No  Duration of Psychotic Symptoms: Greater than six months  Hallucinations:Hallucinations: Auditory  Ideas of Reference:None  Suicidal Thoughts:Suicidal Thoughts: Yes, Active SI Active Intent and/or Plan: With Plan; With Intent  Homicidal Thoughts:Homicidal Thoughts: No   Sensorium  Memory: Immediate Good; Remote Good; Recent Good  Judgment: Poor  Insight: Good   Executive Functions  Concentration: Good  Attention Span: Good  Recall: Good  Fund of Knowledge: Good  Language: Good   Psychomotor Activity  Psychomotor Activity:No data recorded  Assets  Assets: Communication Skills; Desire for Improvement; Housing; Physical Health   Sleep  Sleep: Sleep:  Poor Number of Hours of Sleep: 3.5   Nutritional Assessment (For OBS and FBC admissions only) Has the patient had a weight  loss or gain of 10 pounds or more in the last 3 months?: Yes Has the patient had a decrease in food intake/or appetite?: Yes Does the patient have dental problems?: No Does the patient have eating habits or behaviors that may be indicators of an eating disorder including binging or inducing vomiting?: No Has the patient recently lost weight without trying?: 0 Has the patient been eating poorly because of a decreased appetite?: 0 Malnutrition Screening Tool Score: 0    Physical Exam Vitals and nursing note reviewed.  Constitutional:      General: She is not in acute distress.    Appearance: She is well-developed.  HENT:     Head: Normocephalic and atraumatic.  Eyes:     Conjunctiva/sclera: Conjunctivae normal.  Cardiovascular:     Rate and Rhythm: Normal rate.  Pulmonary:     Effort: Pulmonary effort is normal. No respiratory distress.  Abdominal:     Palpations: Abdomen is soft.     Tenderness: There is no abdominal tenderness.  Musculoskeletal:        General: Normal range of motion.     Cervical back: Normal range of motion.  Neurological:     Mental Status: She is alert and oriented to person, place, and time.  Psychiatric:        Attention and Perception: Attention and perception normal.        Mood and Affect: Mood is anxious and depressed.        Behavior: Behavior normal. Behavior is cooperative.        Thought Content: Thought content includes suicidal ideation. Thought content includes suicidal plan.    Review of Systems  Constitutional: Negative.   HENT: Negative.    Eyes: Negative.   Respiratory: Negative.    Cardiovascular: Negative.   Gastrointestinal: Negative.   Genitourinary: Negative.   Musculoskeletal: Negative.   Skin: Negative.   Neurological: Negative.   Endo/Heme/Allergies: Negative.   Psychiatric/Behavioral:  Positive  for depression, hallucinations and suicidal ideas. The patient is nervous/anxious and has insomnia.     Blood pressure 119/76, pulse (!) 135, temperature 97.8 F (36.6 C), temperature source Oral, resp. rate 18, SpO2 99%. There is no height or weight on file to calculate BMI.  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    Chronic constipation    Depression    Drug-seeking behavior    Essential tremor    Headache    Ineffective individual coping 05/16/2022   Insomnia 01/12/2022   Intentional drug overdose (HCC) 06/05/2022   Neuromuscular disorder (HCC)    Normocytic anemia 06/05/2022   Overdose 07/22/2017   Overdose of acetaminophen 07/2017   and other meds   Overdose, intentional self-harm, initial encounter (HCC) 07/20/2021   Paranoia (HCC) 04/22/2021   Personality disorder (HCC)    Purposeful non-suicidal drug ingestion (HCC) 06/27/2021   Schizo-affective psychosis (HCC)    Schizoaffective disorder (HCC) 07/29/2022   Schizoaffective disorder, bipolar type (HCC)    Seizures (HCC)    Last seizure December 2017   Skin erythema 04/27/2022   Sleep apnea    Suicidal behavior 07/25/2021  Suicidal ideation    Suicide (HCC) 07/01/2021   Suicide attempt (HCC) 07/04/2021      Is the patient at risk to self? Yes  Has the patient been a risk to self in the past 6 months? Yes .    Has the patient been a risk to self within the distant past? Yes   Is the patient a risk to others? No   Has the patient been a risk to others in the past 6 months? No   Has the patient been a risk to others within the distant past? No   Past Medical History:   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 fragile X syndrome    Chronic constipation    Depression    Drug-seeking behavior    Essential tremor    Headache    Ineffective individual coping 05/16/2022   Insomnia 01/12/2022   Intentional drug overdose (HCC) 06/05/2022   Neuromuscular disorder (HCC)    Normocytic anemia 06/05/2022   Overdose 07/22/2017   Overdose of acetaminophen 07/2017   and other meds   Overdose, intentional self-harm, initial encounter (HCC) 07/20/2021   Paranoia (HCC) 04/22/2021   Personality disorder (HCC)    Purposeful non-suicidal drug ingestion (HCC) 06/27/2021   Schizo-affective psychosis (HCC)    Schizoaffective disorder (HCC) 07/29/2022   Schizoaffective disorder, bipolar type (HCC)    Seizures (HCC)    Last seizure December 2017   Skin erythema 04/27/2022   Sleep apnea    Suicidal behavior 07/25/2021   Suicidal ideation    Suicide (HCC) 07/01/2021   Suicide attempt (HCC) 07/04/2021     Family History:  Family History  Problem Relation Age of Onset   Mental illness Father    Asthma Father    PDD Brother    Seizures Brother      Social History:  Social History   Tobacco Use   Smoking status: Former    Types: Cigarettes   Smokeless tobacco: Never   Tobacco comments:    Smoked for 2  years age [age]  Vaping Use   Vaping status: Never Used  Substance Use Topics   Alcohol use: No    Alcohol/week: 1.0 standard drink of alcohol    Types: 1 Standard drinks or equivalent per week    Comment: denies at this time   Drug use: No    Comment: History of cocaine use at age 33 for 4 months     Last Labs:  Admission on 12/14/2022  Component  Date Value Ref Range Status   WBC 12/14/2022 8.6  4.0 - 10.5 K/uL Final   RBC 12/14/2022 4.58  3.87 - 5.11 MIL/uL Final   Hemoglobin 12/14/2022 11.9 (L)  12.0 - 15.0 g/dL Final   HCT 69/62/9528 37.7  36.0 - 46.0 % Final   MCV 12/14/2022 82.3  80.0 - 100.0 fL Final   MCH 12/14/2022 26.0  26.0 - 34.0 pg Final   MCHC 12/14/2022 31.6  30.0 - 36.0 g/dL Final   RDW 41/32/4401 13.7  11.5 - 15.5 % Final   Platelets 12/14/2022 367  150 - 400 K/uL Final   nRBC 12/14/2022 0.0  0.0 - 0.2 % Final   Neutrophils Relative % 12/14/2022 66  % Final   Neutro Abs 12/14/2022 5.6  1.7 - 7.7 K/uL Final   Lymphocytes Relative 12/14/2022 26  % Final   Lymphs Abs 12/14/2022 2.2  0.7 - 4.0 K/uL Final   Monocytes Relative 12/14/2022 6  % Final   Monocytes Absolute 12/14/2022 0.6  0.1 - 1.0 K/uL Final   Eosinophils Relative 12/14/2022 1  % Final   Eosinophils Absolute 12/14/2022 0.1  0.0 - 0.5 K/uL Final   Basophils Relative 12/14/2022 1  % Final   Basophils Absolute 12/14/2022 0.0  0.0 - 0.1 K/uL Final   Immature Granulocytes 12/14/2022 0  % Final   Abs Immature Granulocytes 12/14/2022 0.03  0.00 - 0.07 K/uL Final   Performed at [facility], [address] [place], [place] [address]   Sodium 12/14/2022 140  135 - 145 mmol/L Final   Potassium 12/14/2022 3.9  3.5 - 5.1 mmol/L Final   Chloride 12/14/2022 106  98 - 111 mmol/L Final   CO2 12/14/2022 24  22 - 32 mmol/L Final   Glucose, Bld 12/14/2022 82  70 - 99 mg/dL Final   Glucose reference range applies only to samples taken after fasting for at least 8 hours.   BUN 12/14/2022 <5 (L)  6 - 20 mg/dL Final   Creatinine, Ser 12/14/2022 0.78  0.44 - 1.00 mg/dL Final   Calcium 30/86/5784 9.2  8.9 - 10.3 mg/dL Final   Total Protein 69/62/9528 6.6  6.5 - 8.1 g/dL Final   Albumin 41/32/4401 3.7  3.5 - 5.0 g/dL Final   AST 02/72/5366 19  15 - 41 U/L Final   ALT 12/14/2022 19  0 - 44 U/L Final   Alkaline Phosphatase 12/14/2022 89  38 - 126 U/L Final   Total  Bilirubin 12/14/2022 0.4  <1.2 mg/dL Final   GFR, Estimated 12/14/2022 >60  >60 mL/min Final   Comment: (NOTE) Calculated using the CKD-EPI Creatinine Equation (2021)    Anion gap 12/14/2022 10  5 - 15 Final   Performed at [facility], [address] [place], [place] [address]   Alcohol, Ethyl (B) 12/14/2022 <10  <10 mg/dL Final   Comment: (NOTE) Lowest detectable limit for serum alcohol is 10 mg/dL.  For medical purposes only. Performed at [facility], [address], [place], [place] [address]    Preg Test, Ur 12/14/2022 Negative  Negative Final   POC Amphetamine UR 12/14/2022 None Detected  NONE DETECTED (Cut Off Level 1000 ng/mL) Final   POC Secobarbital (BAR) 12/14/2022 None Detected  NONE DETECTED (Cut Off Level 300 ng/mL) Final   POC Buprenorphine (BUP) 12/14/2022 None Detected  NONE DETECTED (Cut Off Level 10 ng/mL) Final   POC Oxazepam (BZO) 12/14/2022 None Detected  NONE DETECTED (Cut Off Level 300 ng/mL) Final   POC Cocaine UR 12/14/2022 None Detected  NONE DETECTED (Cut Off Level 300 ng/mL) Final   POC Methamphetamine UR 12/14/2022 None Detected  NONE DETECTED (Cut Off Level 1000 ng/mL) Final   POC Morphine 12/14/2022 None Detected  NONE DETECTED (Cut Off Level 300 ng/mL) Final   POC Methadone UR 12/14/2022 None Detected  NONE DETECTED (Cut Off Level 300 ng/mL) Final   POC Oxycodone UR 12/14/2022 None Detected  NONE DETECTED (Cut Off Level 100 ng/mL) Final   POC Marijuana UR 12/14/2022 None Detected  NONE DETECTED (Cut Off Level 50 ng/mL) Final   Cholesterol 12/14/2022 188  0 - 200 mg/dL Final   Triglycerides 86/57/8469 222 (H)  <150 mg/dL Final   HDL 62/95/2841 41  >40 mg/dL Final   Total CHOL/HDL Ratio 12/14/2022 4.6  RATIO Final   VLDL 12/14/2022 44 (H)  0 - 40 mg/dL Final   LDL Cholesterol 12/14/2022 103 (H)  0 - 99 mg/dL Final   Comment:        Total Cholesterol/HDL:CHD Risk Coronary Heart Disease Risk Table                     Men   Women  1/2  Average Risk   3.4   3.3  Average Risk       5.0   4.4  2 X Average Risk   9.6   7.1  3 X Average Risk  23.4   11.0        Use the calculated Patient Ratio above and the CHD Risk Table to determine the patient's CHD Risk.        ATP III CLASSIFICATION (LDL):  <100     mg/dL   Optimal  324-401  mg/dL   Near or Above                    Optimal  130-159  mg/dL   Borderline  027-253  mg/dL   High  >664     mg/dL   Very High Performed at [facility], [address] [place], [place] [address]    TSH 12/14/2022 2.483  0.350 - 4.500 uIU/mL Final   Comment: Performed by a 3rd Generation assay with a functional sensitivity of <=0.01 uIU/mL. Performed at [facility], [address] [place], [place] [address]   Admission on 12/05/2022, Discharged on 12/06/2022  Component Date Value Ref Range Status   Glucose-Capillary 12/05/2022 107 (H)  70 - 99 mg/dL Final   Glucose reference range applies only to samples taken after fasting for at least 8 hours.   Sodium 12/05/2022 137  135 - 145 mmol/L Final   Potassium 12/05/2022 3.7  3.5 - 5.1 mmol/L Final   Chloride 12/05/2022 108  98 - 111 mmol/L Final   CO2 12/05/2022 18 (L)  22 - 32 mmol/L Final   Glucose, Bld 12/05/2022 114 (H)  70 - 99 mg/dL Final   Glucose reference range applies only to samples taken after fasting for at least 8 hours.   BUN 12/05/2022 7  6 - 20 mg/dL Final   Creatinine, Ser 12/05/2022 0.79  0.44 - 1.00 mg/dL Final   Calcium 63/87/5643 8.7 (L)  8.9 - 10.3 mg/dL Final   Total Protein 32/95/1884 6.5  6.5 - 8.1 g/dL Final   Albumin 16/60/6301 3.3 (L)  3.5 - 5.0 g/dL Final   AST 60/11/9321 15  15 - 41 U/L Final   ALT 12/05/2022 14  0 - 44 U/L Final   Alkaline Phosphatase 12/05/2022 65  38 - 126 U/L Final   Total Bilirubin 12/05/2022 0.4  0.3 - 1.2 mg/dL Final   GFR, Estimated 12/05/2022 >60  >60 mL/min Final   Comment: (NOTE) Calculated using the CKD-EPI Creatinine Equation (2021)    Anion gap 12/05/2022 11   5 - 15 Final   Performed at [facility], [address], [place], [place] [address]   Salicylate Lvl 12/05/2022 <7.0 (L)  7.0 - 30.0 mg/dL Final   Performed at [facility], [address], [place], [place] [address]  Acetaminophen (Tylenol), Serum 12/05/2022 <10 (L)  10 - 30 ug/mL Final   Comment: (NOTE) Therapeutic concentrations vary significantly. A range of 10-30 ug/mL  may be an effective concentration for many patients. However, some  are best treated at concentrations outside of this range. Acetaminophen concentrations >150 ug/mL at 4 hours after ingestion  and >50 ug/mL at 12 hours after ingestion are often associated with  toxic reactions.  Performed at [facility], [address] [place], [place] [address]    Alcohol, Ethyl (B) 12/05/2022 <10  <10 mg/dL Final   Comment: (NOTE) Lowest detectable limit for serum alcohol is 10 mg/dL.  For medical purposes only. Performed at [facility], [address], [place], [place] [address]    Opiates 12/05/2022 NONE DETECTED  NONE DETECTED Final   Cocaine 12/05/2022 NONE DETECTED  NONE DETECTED Final   Benzodiazepines 12/05/2022 NONE DETECTED  NONE DETECTED Final   Amphetamines 12/05/2022 NONE DETECTED  NONE DETECTED Final   Tetrahydrocannabinol 12/05/2022 NONE DETECTED  NONE DETECTED Final   Barbiturates 12/05/2022 NONE DETECTED  NONE DETECTED Final   Comment: (NOTE) DRUG SCREEN FOR MEDICAL PURPOSES ONLY.  IF CONFIRMATION IS NEEDED FOR ANY PURPOSE, NOTIFY LAB WITHIN 5 DAYS.  LOWEST DETECTABLE LIMITS FOR URINE DRUG SCREEN Drug Class                     Cutoff (ng/mL) Amphetamine and metabolites    1000 Barbiturate and metabolites    200 Benzodiazepine                 200 Opiates and metabolites        300 Cocaine and metabolites        300 THC                            50 Performed at [facility], [address] [place], [place] [address]    WBC 12/05/2022 6.8  4.0 - 10.5 K/uL Final    RBC 12/05/2022 4.28  3.87 - 5.11 MIL/uL Final   Hemoglobin 12/05/2022 10.8 (L)  12.0 - 15.0 g/dL Final   HCT 91/47/8295 35.0 (L)  36.0 - 46.0 % Final   MCV 12/05/2022 81.8  80.0 - 100.0 fL Final   MCH 12/05/2022 25.2 (L)  26.0 - 34.0 pg Final   MCHC 12/05/2022 30.9  30.0 - 36.0 g/dL Final   RDW 62/13/0865 14.0  11.5 - 15.5 % Final   Platelets 12/05/2022 258  150 - 400 K/uL Final   nRBC 12/05/2022 0.0  0.0 - 0.2 % Final   Neutrophils Relative % 12/05/2022 64  % Final   Neutro Abs 12/05/2022 4.3  1.7 - 7.7 K/uL Final   Lymphocytes Relative 12/05/2022 28  % Final   Lymphs Abs 12/05/2022 1.9  0.7 - 4.0 K/uL Final   Monocytes Relative 12/05/2022 7  % Final   Monocytes Absolute 12/05/2022 0.5  0.1 - 1.0 K/uL Final   Eosinophils Relative 12/05/2022 1  % Final   Eosinophils Absolute 12/05/2022 0.1  0.0 - 0.5 K/uL Final   Basophils Relative 12/05/2022 0  % Final   Basophils Absolute 12/05/2022 0.0  0.0 - 0.1 K/uL Final   Immature Granulocytes 12/05/2022 0  % Final   Abs Immature Granulocytes 12/05/2022 0.03  0.00 - 0.07 K/uL Final   Performed at [facility], [address], [place], [place] [address]  Preg, Serum 12/05/2022 NEGATIVE  NEGATIVE Final   Comment:        THE SENSITIVITY OF THIS METHODOLOGY IS >10 mIU/mL. Performed at [facility], [address], [place], [place] [address]    Acetaminophen (Tylenol), Serum 12/05/2022 <10 (L)  10 - 30 ug/mL Final   Comment: (NOTE) Therapeutic concentrations vary significantly. A range of 10-30 ug/mL  may be an effective concentration for many patients. However, some  are best treated at concentrations outside of this range. Acetaminophen concentrations >150 ug/mL at 4 hours after ingestion  and >50 ug/mL at 12 hours after ingestion are often associated with  toxic reactions.  Performed at [facility], [address] [place], [place] [address]   Admission on 11/30/2022, Discharged on 12/01/2022  Component Date Value  Ref Range Status   WBC 12/01/2022 9.0  4.0 - 10.5 K/uL Final   RBC 12/01/2022 4.16  3.87 - 5.11 MIL/uL Final   Hemoglobin 12/01/2022 10.7 (L)  12.0 - 15.0 g/dL Final   HCT 40/11/2723 34.3 (L)  36.0 - 46.0 % Final   MCV 12/01/2022 82.5  80.0 - 100.0 fL Final   MCH 12/01/2022 25.7 (L)  26.0 - 34.0 pg Final   MCHC 12/01/2022 31.2  30.0 - 36.0 g/dL Final   RDW 36/64/4034 14.2  11.5 - 15.5 % Final   Platelets 12/01/2022 263  150 - 400 K/uL Final   nRBC 12/01/2022 0.0  0.0 - 0.2 % Final   Neutrophils Relative % 12/01/2022 63  % Final   Neutro Abs 12/01/2022 5.6  1.7 - 7.7 K/uL Final   Lymphocytes Relative 12/01/2022 29  % Final   Lymphs Abs 12/01/2022 2.6  0.7 - 4.0 K/uL Final   Monocytes Relative 12/01/2022 7  % Final   Monocytes Absolute 12/01/2022 0.7  0.1 - 1.0 K/uL Final   Eosinophils Relative 12/01/2022 1  % Final   Eosinophils Absolute 12/01/2022 0.1  0.0 - 0.5 K/uL Final   Basophils Relative 12/01/2022 0  % Final   Basophils Absolute 12/01/2022 0.0  0.0 - 0.1 K/uL Final   Immature Granulocytes 12/01/2022 0  % Final   Abs Immature Granulocytes 12/01/2022 0.04  0.00 - 0.07 K/uL Final   Performed at [facility], [address], [place], [place] [address]   Sodium 12/01/2022 137  135 - 145 mmol/L Final   Potassium 12/01/2022 3.6  3.5 - 5.1 mmol/L Final   Chloride 12/01/2022 104  98 - 111 mmol/L Final   CO2 12/01/2022 21 (L)  22 - 32 mmol/L Final   Glucose, Bld 12/01/2022 122 (H)  70 - 99 mg/dL Final   Glucose reference range applies only to samples taken after fasting for at least 8 hours.   BUN 12/01/2022 8  6 - 20 mg/dL Final   Creatinine, Ser 12/01/2022 0.75  0.44 - 1.00 mg/dL Final   Calcium 56/38/7564 8.7 (L)  8.9 - 10.3 mg/dL Final   Total Protein 33/29/5188 6.4 (L)  6.5 - 8.1 g/dL Final   Albumin 41/66/0630 3.3 (L)  3.5 - 5.0 g/dL Final   AST 16/02/930 18  15 - 41 U/L Final   ALT 12/01/2022 16  0 - 44 U/L Final   Alkaline Phosphatase 12/01/2022 70  38 - 126 U/L Final    Total Bilirubin 12/01/2022 0.2 (L)  0.3 - 1.2 mg/dL Final   GFR, Estimated 12/01/2022 >60  >60 mL/min Final   Comment: (NOTE) Calculated using the CKD-EPI Creatinine  Equation (2021)    Anion gap 12/01/2022 12  5 - 15 Final   Performed at [facility], [address], [place], [place] [address]   Lipase 12/01/2022 27  11 - 51 U/L Final   Performed at [facility], [address] [place], [place] [address]   Preg, Serum 12/01/2022 NEGATIVE  NEGATIVE Final   Comment:        THE SENSITIVITY OF THIS METHODOLOGY IS >10 mIU/mL. Performed at [facility], [address] [place], [place] [address]    Color, Urine 12/01/2022 YELLOW  YELLOW Final   APPearance 12/01/2022 CLEAR  CLEAR Final   Specific Gravity, Urine 12/01/2022 1.013  1.005 - 1.030 Final   pH 12/01/2022 7.0  5.0 - 8.0 Final   Glucose, UA 12/01/2022 NEGATIVE  NEGATIVE mg/dL Final   Hgb urine dipstick 12/01/2022 NEGATIVE  NEGATIVE Final   Bilirubin Urine 12/01/2022 NEGATIVE  NEGATIVE Final   Ketones, ur 12/01/2022 NEGATIVE  NEGATIVE mg/dL Final   Protein, ur 91/47/8295 NEGATIVE  NEGATIVE mg/dL Final   Nitrite 62/13/0865 NEGATIVE  NEGATIVE Final   Leukocytes,Ua 12/01/2022 NEGATIVE  NEGATIVE Final   Performed at [facility], [address], [place], [place] [address]   Sodium 12/01/2022 140  135 - 145 mmol/L Final   Potassium 12/01/2022 4.1  3.5 - 5.1 mmol/L Final   Chloride 12/01/2022 104  98 - 111 mmol/L Final   BUN 12/01/2022 8  6 - 20 mg/dL Final   Creatinine, Ser 12/01/2022 0.70  0.44 - 1.00 mg/dL Final   Glucose, Bld 62/95/2841 122 (H)  70 - 99 mg/dL Final   Glucose reference range applies only to samples taken after fasting for at least 8 hours.   Calcium, Ion 12/01/2022 1.13 (L)  1.15 - 1.40 mmol/L Final   TCO2 12/01/2022 25  22 - 32 mmol/L Final   Hemoglobin 12/01/2022 10.9 (L)  12.0 - 15.0 g/dL Final   HCT 32/44/0102 32.0 (L)  36.0 - 46.0 % Final  Admission on 10/19/2022, Discharged on  10/31/2022  Component Date Value Ref Range Status   Preg Test, Ur 10/21/2022 NEGATIVE  NEGATIVE Final   Comment:        THE SENSITIVITY OF THIS METHODOLOGY IS >25 mIU/mL. Performed at [facility], [address] [place], [place] [address]    TSH 10/22/2022 2.850  0.350 - 4.500 uIU/mL Final   Comment: Performed by a 3rd Generation assay with a functional sensitivity of <=0.01 uIU/mL. Performed at [facility], [address] [place], [place] [id number]    Cholesterol 10/22/2022 192  0 - 200 mg/dL Final   Triglycerides 47/42/5956 143  <150 mg/dL Final   HDL 38/75/6433 46  >40 mg/dL Final   Total CHOL/HDL Ratio 10/22/2022 4.2  RATIO Final   VLDL 10/22/2022 29  0 - 40 mg/dL Final   LDL Cholesterol 10/22/2022 117 (H)  0 - 99 mg/dL Final   Comment:        Total Cholesterol/HDL:CHD Risk Coronary Heart Disease Risk Table                     Men   Women  1/2 Average Risk   3.4   3.3  Average Risk       5.0   4.4  2 X Average Risk   9.6   7.1  3 X Average Risk  23.4   11.0  Use the calculated Patient Ratio above and the CHD Risk Table to determine the patient's CHD Risk.        ATP III CLASSIFICATION (LDL):  <100     mg/dL   Optimal  295-284  mg/dL   Near or Above                    Optimal  130-159  mg/dL   Borderline  132-440  mg/dL   High  >102     mg/dL   Very High Performed at [facility], [address] [place], [place] [address]    Hgb A1c MFr Bld 10/22/2022 5.4  4.8 - 5.6 % Final   Comment: (NOTE)         Prediabetes: 5.7 - 6.4         Diabetes: >6.4         Glycemic control for adults with diabetes: <7.0    Mean Plasma Glucose 10/22/2022 108  mg/dL Final   Comment: (NOTE) Performed At: [facility] [address] [place], [place] [id number] Jolene Schimke MD [id number]    Vit D, 25-Hydroxy 10/22/2022 31.39  30 - 100 ng/mL Final   Comment: (NOTE) Vitamin D deficiency has been defined by  the Institute of Medicine  and an Endocrine Society practice guideline as a level of serum 25-OH  vitamin D less than 20 ng/mL (1,2). The Endocrine Society went on to  further define vitamin D insufficiency as a level between 21 and 29  ng/mL (2).  1. IOM (Institute of Medicine). 2010. Dietary reference intakes for  calcium and D. [place]: [employer]. 2. Holick MF, Binkley [place], Bischoff-Ferrari HA, et al. Evaluation,  treatment, and prevention of vitamin D deficiency: an Endocrine  Society clinical practice guideline, JCEM. 2011 Jul; 96(7): 1911-30.  Performed at [facility], [address], [place], [place] [address]   No results displayed because visit has over 200 results.    Admission on 08/20/2022, Discharged on 08/22/2022  Component Date Value Ref Range Status   WBC 08/20/2022 8.2  4.0 - 10.5 K/uL Final   RBC 08/20/2022 4.80  3.87 - 5.11 MIL/uL Final   Hemoglobin 08/20/2022 11.9 (L)  12.0 - 15.0 g/dL Final   HCT 84/16/6063 38.5  36.0 - 46.0 % Final   MCV 08/20/2022 80.2  80.0 - 100.0 fL Final   MCH 08/20/2022 24.8 (L)  26.0 - 34.0 pg Final   MCHC 08/20/2022 30.9  30.0 - 36.0 g/dL Final   RDW 01/60/1093 14.9  11.5 - 15.5 % Final   Platelets 08/20/2022 372  150 - 400 K/uL Final   nRBC 08/20/2022 0.0  0.0 - 0.2 % Final   Neutrophils Relative % 08/20/2022 64  % Final   Neutro Abs 08/20/2022 5.2  1.7 - 7.7 K/uL Final   Lymphocytes Relative 08/20/2022 28  % Final   Lymphs Abs 08/20/2022 2.3  0.7 - 4.0 K/uL Final   Monocytes Relative 08/20/2022 6  % Final   Monocytes Absolute 08/20/2022 0.5  0.1 - 1.0 K/uL Final   Eosinophils Relative 08/20/2022 1  % Final   Eosinophils Absolute 08/20/2022 0.1  0.0 - 0.5 K/uL Final   Basophils Relative 08/20/2022 1  % Final   Basophils Absolute 08/20/2022 0.1  0.0 - 0.1 K/uL Final   Immature Granulocytes 08/20/2022 0  % Final   Abs Immature Granulocytes 08/20/2022 0.03  0.00 - 0.07 K/uL Final   Performed at  [facility], [address] [place], [place] [address]   Sodium 08/20/2022 138  135 - 145 mmol/L Final   Potassium 08/20/2022 3.6  3.5 - 5.1 mmol/L Final   Chloride 08/20/2022 107  98 - 111 mmol/L Final   CO2 08/20/2022 22  22 - 32 mmol/L Final   Glucose, Bld 08/20/2022 90  70 - 99 mg/dL Final   Glucose reference range applies only to samples taken after fasting for at least 8 hours.   BUN 08/20/2022 10  6 - 20 mg/dL Final   Creatinine, Ser 08/20/2022 0.70  0.44 - 1.00 mg/dL Final   Calcium 62/13/0865 9.3  8.9 - 10.3 mg/dL Final   Total Protein 78/46/9629 7.7  6.5 - 8.1 g/dL Final   Albumin 52/84/1324 3.9  3.5 - 5.0 g/dL Final   AST 40/11/2723 20  15 - 41 U/L Final   ALT 08/20/2022 19  0 - 44 U/L Final   Alkaline Phosphatase 08/20/2022 84  38 - 126 U/L Final   Total Bilirubin 08/20/2022 0.1 (L)  0.3 - 1.2 mg/dL Final   GFR, Estimated 08/20/2022 >60  >60 mL/min Final   Comment: (NOTE) Calculated using the CKD-EPI Creatinine Equation (2021)    Anion gap 08/20/2022 9  5 - 15 Final   Performed at [facility], [address] [place], [place] [address]   Preg Test, Ur 08/20/2022 Negative  Negative Final   POC Amphetamine UR 08/20/2022 None Detected  NONE DETECTED (Cut Off Level 1000 ng/mL) Final   POC Secobarbital (BAR) 08/20/2022 None Detected  NONE DETECTED (Cut Off Level 300 ng/mL) Final   POC Buprenorphine (BUP) 08/20/2022 None Detected  NONE DETECTED (Cut Off Level 10 ng/mL) Final   POC Oxazepam (BZO) 08/20/2022 None Detected  NONE DETECTED (Cut Off Level 300 ng/mL) Final   POC Cocaine UR 08/20/2022 None Detected  NONE DETECTED (Cut Off Level 300 ng/mL) Final   POC Methamphetamine UR 08/20/2022 None Detected  NONE DETECTED (Cut Off Level 1000 ng/mL) Final   POC Morphine 08/20/2022 None Detected  NONE DETECTED (Cut Off Level 300 ng/mL) Final   POC Methadone UR 08/20/2022 None Detected  NONE DETECTED (Cut Off Level 300 ng/mL) Final   POC Oxycodone UR 08/20/2022 None Detected   NONE DETECTED (Cut Off Level 100 ng/mL) Final   POC Marijuana UR 08/20/2022 None Detected  NONE DETECTED (Cut Off Level 50 ng/mL) Final   Preg Test, Ur 08/20/2022 NEGATIVE  NEGATIVE Final   Comment:        THE SENSITIVITY OF THIS METHODOLOGY IS >24 mIU/mL   Admission on 08/08/2022, Discharged on 08/12/2022  Component Date Value Ref Range Status   Sodium 08/09/2022 136  135 - 145 mmol/L Final   Potassium 08/09/2022 4.1  3.5 - 5.1 mmol/L Final   Chloride 08/09/2022 105  98 - 111 mmol/L Final   CO2 08/09/2022 23  22 - 32 mmol/L Final   Glucose, Bld 08/09/2022 109 (H)  70 - 99 mg/dL Final   Glucose reference range applies only to samples taken after fasting for at least 8 hours.   BUN 08/09/2022 13  6 - 20 mg/dL Final   Creatinine, Ser 08/09/2022 0.69  0.44 - 1.00 mg/dL Final   Calcium 03/47/4259 9.0  8.9 - 10.3 mg/dL Final   Total Protein 56/38/7564 7.0  6.5 - 8.1 g/dL Final   Albumin 33/29/5188 3.5  3.5 - 5.0 g/dL Final   AST 41/66/0630 20  15 - 41  U/L Final   ALT 08/09/2022 22  0 - 44 U/L Final   Alkaline Phosphatase 08/09/2022 67  38 - 126 U/L Final   Total Bilirubin 08/09/2022 0.3  0.3 - 1.2 mg/dL Final   GFR, Estimated 08/09/2022 >60  >60 mL/min Final   Comment: (NOTE) Calculated using the CKD-EPI Creatinine Equation (2021)    Anion gap 08/09/2022 8  5 - 15 Final   Performed at [facility], [address] [place], [place] [address]   Alcohol, Ethyl (B) 08/09/2022 <10  <10 mg/dL Final   Comment: (NOTE) Lowest detectable limit for serum alcohol is 10 mg/dL.  For medical purposes only. Performed at [facility], [address], [place], [place] [address]    Opiates 08/09/2022 NONE DETECTED  NONE DETECTED Final   Cocaine 08/09/2022 NONE DETECTED  NONE DETECTED Final   Benzodiazepines 08/09/2022 NONE DETECTED  NONE DETECTED Final   Amphetamines 08/09/2022 NONE DETECTED  NONE DETECTED Final   Tetrahydrocannabinol 08/09/2022 NONE DETECTED   NONE DETECTED Final   Barbiturates 08/09/2022 NONE DETECTED  NONE DETECTED Final   Comment: (NOTE) DRUG SCREEN FOR MEDICAL PURPOSES ONLY.  IF CONFIRMATION IS NEEDED FOR ANY PURPOSE, NOTIFY LAB WITHIN 5 DAYS.  LOWEST DETECTABLE LIMITS FOR URINE DRUG SCREEN Drug Class                     Cutoff (ng/mL) Amphetamine and metabolites    1000 Barbiturate and metabolites    200 Benzodiazepine                 200 Opiates and metabolites        300 Cocaine and metabolites        300 THC                            50 Performed at [facility], [address] [place], [place] [address]    Preg, Serum 08/09/2022 NEGATIVE  NEGATIVE Final   Comment:        THE SENSITIVITY OF THIS METHODOLOGY IS >10 mIU/mL. Performed at [facility], [address], [place], [place] [address]    Salicylate Lvl 08/09/2022 <7.0 (L)  7.0 - 30.0 mg/dL Final   Performed at [facility], [address], [place], [place] [address]   Acetaminophen (Tylenol), Serum 08/09/2022 <10 (L)  10 - 30 ug/mL Final   Comment: (NOTE) Therapeutic concentrations vary significantly. A range of 10-30 ug/mL  may be an effective concentration for many patients. However, some  are best treated at concentrations outside of this range. Acetaminophen concentrations >150 ug/mL at 4 hours after ingestion  and >50 ug/mL at 12 hours after ingestion are often associated with  toxic reactions.  Performed at [facility], [address] [place], [place] [address]    WBC 08/09/2022 10.2  4.0 - 10.5 K/uL Final   RBC 08/09/2022 4.06  3.87 - 5.11 MIL/uL Final   Hemoglobin 08/09/2022 10.1 (L)  12.0 - 15.0 g/dL Final   HCT 95/63/8756 32.4 (L)  36.0 - 46.0 % Final   MCV 08/09/2022 79.8 (L)  80.0 - 100.0 fL Final   MCH 08/09/2022 24.9 (L)  26.0 - 34.0 pg Final   MCHC 08/09/2022 31.2  30.0 - 36.0 g/dL Final   RDW 43/32/9518 15.2  11.5 - 15.5 % Final   Platelets  08/09/2022 304  150 - 400  K/uL Final   nRBC 08/09/2022 0.0  0.0 - 0.2 % Final   Neutrophils Relative % 08/09/2022 67  % Final   Neutro Abs 08/09/2022 6.9  1.7 - 7.7 K/uL Final   Lymphocytes Relative 08/09/2022 26  % Final   Lymphs Abs 08/09/2022 2.6  0.7 - 4.0 K/uL Final   Monocytes Relative 08/09/2022 5  % Final   Monocytes Absolute 08/09/2022 0.6  0.1 - 1.0 K/uL Final   Eosinophils Relative 08/09/2022 1  % Final   Eosinophils Absolute 08/09/2022 0.1  0.0 - 0.5 K/uL Final   Basophils Relative 08/09/2022 1  % Final   Basophils Absolute 08/09/2022 0.1  0.0 - 0.1 K/uL Final   Immature Granulocytes 08/09/2022 0  % Final   Abs Immature Granulocytes 08/09/2022 0.03  0.00 - 0.07 K/uL Final   Performed at [facility], [address], [place], [place] [address]   Acetaminophen (Tylenol), Serum 08/09/2022 <10 (L)  10 - 30 ug/mL Final   Comment: (NOTE) Therapeutic concentrations vary significantly. A range of 10-30 ug/mL  may be an effective concentration for many patients. However, some  are best treated at concentrations outside of this range. Acetaminophen concentrations >150 ug/mL at 4 hours after ingestion  and >50 ug/mL at 12 hours after ingestion are often associated with  toxic reactions.  Performed at [facility], [address] [place], [place] [address]    Glucose-Capillary 08/09/2022 120 (H)  70 - 99 mg/dL Final   Glucose reference range applies only to samples taken after fasting for at least 8 hours.   Sodium 08/09/2022 135  135 - 145 mmol/L Final   Potassium 08/09/2022 4.2  3.5 - 5.1 mmol/L Final   Chloride 08/09/2022 105  98 - 111 mmol/L Final   CO2 08/09/2022 24  22 - 32 mmol/L Final   Glucose, Bld 08/09/2022 110 (H)  70 - 99 mg/dL Final   Glucose reference range applies only to samples taken after fasting for at least 8 hours.   BUN 08/09/2022 13  6 - 20 mg/dL Final   Creatinine, Ser 08/09/2022 0.88  0.44 - 1.00 mg/dL Final    Calcium 09/81/1914 9.0  8.9 - 10.3 mg/dL Final   GFR, Estimated 08/09/2022 >60  >60 mL/min Final   Comment: (NOTE) Calculated using the CKD-EPI Creatinine Equation (2021)    Anion gap 08/09/2022 6  5 - 15 Final   Performed at [facility], [address] [place], [place] [address]   pH, Ven 08/09/2022 7.37  7.25 - 7.43 Final   pCO2, Ven 08/09/2022 52  44 - 60 mmHg Final   pO2, Ven 08/09/2022 <31 (LL)  32 - 45 mmHg Final   Comment: CRITICAL RESULT CALLED TO, READ BACK BY AND VERIFIED WITH: FOX B. RN AT 1202 ON 08/09/2022 BY MECIAL J.    Bicarbonate 08/09/2022 30.1 (H)  20.0 - 28.0 mmol/L Final   Acid-Base Excess 08/09/2022 3.6 (H)  0.0 - 2.0 mmol/L Final   O2 Saturation 08/09/2022 38.3  % Final   Patient temperature 08/09/2022 37.0   Final   Performed at [facility], [address], [place], [place] [address]   Ammonia 08/09/2022 13  9 - 35 umol/L Final   Performed at [facility], [address] [place], [place] [address]   Magnesium 08/09/2022 1.8  1.7 - 2.4 mg/dL Final   Performed at [facility], [address] [place], [place] [address]  Admission on 08/04/2022, Discharged on 08/05/2022  Component Date Value Ref Range Status   Sodium 08/04/2022 143  135 - 145 mmol/L Final   Potassium 08/04/2022 4.1  3.5 - 5.1 mmol/L Final   Chloride 08/04/2022 108  98 - 111 mmol/L Final   CO2 08/04/2022 19 (L)  22 - 32 mmol/L Final   Glucose, Bld 08/04/2022 145 (H)  70 - 99 mg/dL Final   Glucose reference range applies only to samples taken after fasting for at least 8 hours.   BUN 08/04/2022 10  6 - 20 mg/dL Final   Creatinine, Ser 08/04/2022 0.74  0.44 - 1.00 mg/dL Final   Calcium 13/24/4010 9.3  8.9 - 10.3 mg/dL Final   GFR, Estimated 08/04/2022 >60  >60 mL/min Final   Comment: (NOTE) Calculated using the CKD-EPI Creatinine Equation (2021)    Anion gap 08/04/2022 16 (H)  5 - 15 Final   Performed at [facility], [address] [place], [place] [address]   WBC 08/04/2022 10.2  4.0 - 10.5 K/uL Final   RBC 08/04/2022 4.68  3.87 - 5.11 MIL/uL Final   Hemoglobin 08/04/2022 11.2 (L)  12.0 - 15.0 g/dL Final   HCT 66/44/0347 37.6  36.0 - 46.0 % Final   MCV 08/04/2022 80.3  80.0 - 100.0 fL Final   MCH 08/04/2022 23.9 (L)  26.0 - 34.0 pg Final   MCHC 08/04/2022 29.8 (L)  30.0 - 36.0 g/dL Final   RDW 42/59/5638 15.1  11.5 - 15.5 % Final   Platelets 08/04/2022 329  150 - 400 K/uL Final   nRBC 08/04/2022 0.0  0.0 - 0.2 % Final   Performed at [facility], [address], [place], [place] [address]   Preg, Serum 08/04/2022 NEGATIVE  NEGATIVE Final   Performed at [facility], [address], [place], [place] [address]   D-Dimer, Quant 08/04/2022 <0.27  0.00 - 0.50 ug/mL-FEU Final   Comment: (NOTE) At the manufacturer cut-off value of 0.5 g/mL FEU, this assay has a negative predictive value of 95-100%.This assay is intended for use in conjunction with a clinical pretest probability (PTP) assessment model to exclude pulmonary embolism (PE) and deep venous thrombosis (DVT) in outpatients suspected of PE or DVT. Results should be correlated with clinical presentation. Performed at [facility], [address], [place], [place] [address]    Total Protein 08/04/2022 6.7  6.5 - 8.1 g/dL Final   Albumin 60/63/0160 3.4 (L)  3.5 - 5.0 g/dL Final   AST 10/93/2355 23  15 - 41 U/L Final   ALT 08/04/2022 25  0 - 44 U/L Final   Alkaline Phosphatase 08/04/2022 65  38 - 126 U/L Final   Total Bilirubin 08/04/2022 0.4  0.3 - 1.2 mg/dL Final   Bilirubin, Direct 08/04/2022 <0.1  0.0 - 0.2 mg/dL Final   Indirect Bilirubin 08/04/2022 NOT CALCULATED  0.3 - 0.9 mg/dL Final   Performed at [facility] [place] [facility], [address] [place], [place] [address]   Opiates 08/04/2022 NONE DETECTED  NONE DETECTED Final   Cocaine 08/04/2022 NONE DETECTED  NONE DETECTED Final   Benzodiazepines 08/04/2022 NONE  DETECTED  NONE DETECTED Final   Amphetamines 08/04/2022 NONE DETECTED  NONE DETECTED Final   Tetrahydrocannabinol 08/04/2022 NONE DETECTED  NONE DETECTED Final   Barbiturates 08/04/2022 NONE DETECTED  NONE DETECTED Final   Comment: (NOTE) DRUG SCREEN FOR MEDICAL PURPOSES ONLY.  IF CONFIRMATION IS NEEDED FOR ANY PURPOSE,  NOTIFY LAB WITHIN 5 DAYS.  LOWEST DETECTABLE LIMITS FOR URINE DRUG SCREEN Drug Class                     Cutoff (ng/mL) Amphetamine and metabolites    1000 Barbiturate and metabolites    200 Benzodiazepine                 200 Opiates and metabolites        300 Cocaine and metabolites        300 THC                            50 Performed at [facility], [address], [place], [place] [address]   Admission on 07/29/2022, Discharged on 08/02/2022  Component Date Value Ref Range Status   Iron 07/31/2022 50  28 - 170 ug/dL Final   TIBC 60/45/4098 409  250 - 450 ug/dL Final   Saturation Ratios 07/31/2022 12  10.4 - 31.8 % Final   UIBC 07/31/2022 359  ug/dL Final   Performed at [facility], [address], [place], [place] [address]   Ferritin 07/31/2022 7 (L)  11 - 307 ng/mL Final   Performed at [facility], [address], [place], [place] [address]  Admission on 07/29/2022, Discharged on 07/29/2022  Component Date Value Ref Range Status   Sodium 07/29/2022 137  135 - 145 mmol/L Final   Potassium 07/29/2022 3.7  3.5 - 5.1 mmol/L Final   Chloride 07/29/2022 102  98 - 111 mmol/L Final   CO2 07/29/2022 23  22 - 32 mmol/L Final   Glucose, Bld 07/29/2022 101 (H)  70 - 99 mg/dL Final   Glucose reference range applies only to samples taken after fasting for at least 8 hours.   BUN 07/29/2022 7  6 - 20 mg/dL Final   Creatinine, Ser 07/29/2022 0.76  0.44 - 1.00 mg/dL Final   Calcium 62/13/0865 9.0  8.9 - 10.3 mg/dL Final   Total Protein 78/46/9629 7.2  6.5 - 8.1 g/dL Final   Albumin 52/84/1324 3.8  3.5 - 5.0 g/dL Final    AST 40/11/2723 19  15 - 41 U/L Final   ALT 07/29/2022 16  0 - 44 U/L Final   Alkaline Phosphatase 07/29/2022 75  38 - 126 U/L Final   Total Bilirubin 07/29/2022 0.3  0.3 - 1.2 mg/dL Final   GFR, Estimated 07/29/2022 >60  >60 mL/min Final   Comment: (NOTE) Calculated using the CKD-EPI Creatinine Equation (2021)    Anion gap 07/29/2022 12  5 - 15 Final   Performed at [facility], [address], [place], [place] [address]   Alcohol, Ethyl (B) 07/29/2022 <10  <10 mg/dL Final   Comment: (NOTE) Lowest detectable limit for serum alcohol is 10 mg/dL.  For medical purposes only. Performed at [facility], [address] [place], [place] [address]    Salicylate Lvl 07/29/2022 <7.0 (L)  7.0 - 30.0 mg/dL Final   Performed at [facility], [address], [place], [place] [address]   Acetaminophen (Tylenol), Serum 07/29/2022 <10 (L)  10 - 30 ug/mL Final   Comment: (NOTE) Therapeutic concentrations vary significantly. A range of 10-30 ug/mL  may be an effective concentration for many patients. However, some  are best treated at concentrations outside of this range. Acetaminophen concentrations >150 ug/mL at 4 hours after ingestion  and >50 ug/mL at 12 hours after ingestion are often associated with  toxic reactions.  Performed at [facility], [address], [place], [place] [address]    WBC 07/29/2022 10.8 (H)  4.0 - 10.5 K/uL Final   RBC 07/29/2022 4.52  3.87 - 5.11 MIL/uL Final   Hemoglobin 07/29/2022 11.0 (L)  12.0 - 15.0 g/dL Final   HCT 60/45/4098 35.9 (L)  36.0 - 46.0 % Final   MCV 07/29/2022 79.4 (L)  80.0 - 100.0 fL Final   MCH 07/29/2022 24.3 (L)  26.0 - 34.0 pg Final   MCHC 07/29/2022 30.6  30.0 - 36.0 g/dL Final   RDW 11/91/4782 14.6  11.5 - 15.5 % Final   Platelets 07/29/2022 288  150 - 400 K/uL Final   nRBC 07/29/2022 0.0  0.0 - 0.2 % Final   Performed at [facility], [address], [place], [place] [address]   Opiates 07/29/2022 NONE  DETECTED  NONE DETECTED Final   Cocaine 07/29/2022 NONE DETECTED  NONE DETECTED Final   Benzodiazepines 07/29/2022 NONE DETECTED  NONE DETECTED Final   Amphetamines 07/29/2022 NONE DETECTED  NONE DETECTED Final   Tetrahydrocannabinol 07/29/2022 NONE DETECTED  NONE DETECTED Final   Barbiturates 07/29/2022 NONE DETECTED  NONE DETECTED Final   Comment: (NOTE) DRUG SCREEN FOR MEDICAL PURPOSES ONLY.  IF CONFIRMATION IS NEEDED FOR ANY PURPOSE, NOTIFY LAB WITHIN 5 DAYS.  LOWEST DETECTABLE LIMITS FOR URINE DRUG SCREEN Drug Class                     Cutoff (ng/mL) Amphetamine and metabolites    1000 Barbiturate and metabolites    200 Benzodiazepine                 200 Opiates and metabolites        300 Cocaine and metabolites        300 THC                            50 Performed at [facility], [address], [place], [place] [address]    Preg Test, Ur 07/29/2022 NEGATIVE  NEGATIVE Final   Comment:        THE SENSITIVITY OF THIS METHODOLOGY IS >25 mIU/mL. Performed at [facility], [address] [place], [place] [address]   There may be more visits with results that are not included.    Allergies: Bee venom, Coconut flavor, Fish allergy, Geodon [ziprasidone hcl], Haloperidol and related, Lithobid [lithium], Roxicodone [oxycodone], Seroquel [quetiapine], Shellfish allergy, Phenergan [promethazine hcl], Prilosec [omeprazole], Sulfa antibiotics, Tegretol [carbamazepine], Prozac [fluoxetine], Tape, and Tylenol [acetaminophen]  Medications:  Facility Ordered Medications  Medication   alum & mag hydroxide-simeth (MAALOX/MYLANTA) 200-200-20 MG/5ML suspension 30 mL   magnesium hydroxide (MILK OF MAGNESIA) suspension 30 mL   hydrOXYzine (ATARAX) tablet 25 mg   traZODone (DESYREL) tablet 50 mg   albuterol (VENTOLIN HFA) 108 (90 Base) MCG/ACT inhaler 2 puff   ARIPiprazole (ABILIFY) tablet 2 mg   cyclobenzaprine (FLEXERIL) tablet 10 mg   DULoxetine (CYMBALTA) DR capsule 60 mg    mometasone-formoterol (DULERA) 200-5 MCG/ACT inhaler 2 puff   OLANZapine (ZYPREXA) tablet 15 mg   pantoprazole (PROTONIX) EC tablet 40 mg   prazosin (MINIPRESS) capsule 1 mg   propafenone (RYTHMOL SR) 12 hr capsule 225 mg   OLANZapine (ZYPREXA) tablet 5 mg   ibuprofen (ADVIL) tablet 600 mg  diclofenac Sodium (VOLTAREN) 1 % topical gel 2 g   gabapentin (NEURONTIN) capsule 600 mg   PTA Medications  Medication Sig   ibuprofen (ADVIL) 600 MG tablet Take 1 tablet (600 mg total) by mouth 3 (three) times daily as needed for mild pain or moderate pain.   diclofenac Sodium (VOLTAREN) 1 % GEL Apply 2 g topically 4 (four) times daily. (Patient taking differently: Apply 2 g topically in the morning, at noon, and at bedtime.)   ferrous sulfate 325 (65 FE) MG tablet Take 1 tablet (325 mg total) by mouth 2 (two) times daily with a meal. (Patient not taking: Reported on 12/05/2022)   senna-docusate (SENOKOT-S) 8.6-50 MG tablet Take 1 tablet by mouth 2 (two) times daily as needed for moderate constipation. (Patient not taking: Reported on 12/05/2022)   metoprolol tartrate (LOPRESSOR) 25 MG tablet Take 1 tablet (25 mg total) by mouth every 12 (twelve) hours for 7 days.   prazosin (MINIPRESS) 1 MG capsule Take 1 capsule (1 mg total) by mouth at bedtime for 7 days.   ARIPiprazole (ABILIFY) 2 MG tablet Take 1 tablet (2 mg total) by mouth daily for 7 days.   DULoxetine (CYMBALTA) 60 MG capsule Take 1 capsule (60 mg total) by mouth daily for 7 days.   OLANZapine (ZYPREXA) 5 MG tablet Take 3 tablets (15 mg total) by mouth at bedtime for 7 days. (Patient taking differently: Take 5 mg by mouth in the morning and at bedtime. Take 1 tablet at 1200 and 1700)   polyethylene glycol (MIRALAX / GLYCOLAX) 17 g packet Take 17 g by mouth 2 (two) times daily as needed for mild constipation. (Patient not taking: Reported on 12/05/2022)   pantoprazole (PROTONIX) 40 MG tablet Take 1 tablet (40 mg total) by mouth daily before  breakfast for 7 days.   albuterol (VENTOLIN HFA) 108 (90 Base) MCG/ACT inhaler Inhale 2 puffs into the lungs every 6 (six) hours as needed for wheezing or shortness of breath.   mometasone-formoterol (DULERA) 200-5 MCG/ACT AERO Inhale 2 puffs into the lungs 2 (two) times daily.   gabapentin (NEURONTIN) 600 MG tablet Take 600 mg by mouth 3 (three) times daily.   OLANZapine (ZYPREXA) 15 MG tablet Take 15 mg by mouth at bedtime.   cyclobenzaprine (FLEXERIL) 10 MG tablet Take 10 mg by mouth at bedtime.   hydrOXYzine (ATARAX) 25 MG tablet Take 25 mg by mouth 3 (three) times daily.   propafenone (RYTHMOL SR) 225 MG 12 hr capsule Take 225 mg by mouth 2 (two) times daily.      Medical Decision Making  Patient with a history of multiple suicidal and inpatient psychiatric admission.  She is reporting active suicidal ideation and states that she is unable to maintain her safety.  Patient will be admitted to [facility] for continuous assessment with plan reevaluation by psychiatry on 12-15-2022 for disposition.    Recommendations  Based on my evaluation the patient does not appear to have an emergency medical condition.  Maricela Bo, NP 12/15/22  1:26 AM

## 2022-12-14 NOTE — ED Provider Notes (Incomplete)
[facility] Continuous Assessment Admission H&P  Date: 12/14/22 Patient Name: [name] MRN: [id number] Chief Complaint: ***  Diagnoses:  Final diagnoses:  None    HPI: [name] is a 32 year old female with psychiatric history significant for borderline personality disorder, PTSD, schizoaffective disorder bipolar type, depression, and autism spectrum disorder.  Patient was brought voluntarily to [facility] by law enforcement with chief complaint of  Total Time spent with patient: {Time; 15 min - 8 hours:17441}  Musculoskeletal  Strength & Muscle Tone: {desc; muscle tone:32375} Gait & Station: {PE GAIT ED NUUV:25366} Patient leans: {Patient Leans:21022755}  Psychiatric Specialty Exam  Presentation General Appearance:  Appropriate for Environment  Eye Contact: Good  Speech: Clear and Coherent  Speech Volume: Normal  Handedness: Right   Mood and Affect  Mood: Depressed  Affect: Appropriate   Thought Process  Thought Processes: Coherent  Descriptions of Associations:Intact  Orientation:Full (Time, Place and Person)  Thought Content:WDL  Diagnosis of Schizophrenia or Schizoaffective disorder in past: No  Duration of Psychotic Symptoms: Greater than six months  Hallucinations:Hallucinations: Auditory  Ideas of Reference:None  Suicidal Thoughts:Suicidal Thoughts: Yes, Active SI Active Intent and/or Plan: With Plan; With Intent  Homicidal Thoughts:Homicidal Thoughts: No   Sensorium  Memory: Immediate Good; Remote Good; Recent Good  Judgment: Poor  Insight: Good   Executive Functions  Concentration: Good  Attention Span: Good  Recall: Good  Fund of Knowledge: Good  Language: Good   Psychomotor Activity  Psychomotor Activity:No data recorded  Assets  Assets: Communication Skills; Desire for Improvement; Housing; Physical Health   Sleep  Sleep: Sleep: Poor Number of Hours of Sleep:  3.5   Nutritional Assessment (For OBS and FBC admissions only) Has the patient had a weight loss or gain of 10 pounds or more in the last 3 months?: Yes Has the patient had a decrease in food intake/or appetite?: Yes Does the patient have dental problems?: No Does the patient have eating habits or behaviors that may be indicators of an eating disorder including binging or inducing vomiting?: No Has the patient recently lost weight without trying?: 0 Has the patient been eating poorly because of a decreased appetite?: 0 Malnutrition Screening Tool Score: 0    Physical Exam ROS  Blood pressure 119/76, pulse (!) 135, temperature 97.8 F (36.6 C), temperature source Oral, resp. rate 18, SpO2 99%. There is no height or weight on file to calculate BMI.  Past Psychiatric History: ***   Is the patient at risk to self? {YES/NO:21197} Has the patient been a risk to self in the past 6 months? {YES/NO:21197}.    Has the patient been a risk to self within the distant past? {YES/NO:21197}  Is the patient a risk to others? {YES/NO:21197}  Has the patient been a risk to others in the past 6 months? {YES/NO:21197}  Has the patient been a risk to others within the distant past? {YES/NO:21197}  Past Medical History: ***  Family History: ***  Social History: ***  Last Labs:  Admission on 12/14/2022  Component Date Value Ref Range Status  . Preg Test, Ur 12/14/2022 Negative  Negative Final  . POC Amphetamine UR 12/14/2022 None Detected  NONE DETECTED (Cut Off Level 1000 ng/mL) Final  . POC Secobarbital (BAR) 12/14/2022 None Detected  NONE DETECTED (Cut Off Level 300 ng/mL) Final  . POC Buprenorphine (BUP) 12/14/2022 None Detected  NONE DETECTED (Cut Off Level 10 ng/mL) Final  . POC Oxazepam (BZO) 12/14/2022 None Detected  NONE DETECTED (Cut Off  Level 300 ng/mL) Final  . POC Cocaine UR 12/14/2022 None Detected  NONE DETECTED (Cut Off Level 300 ng/mL) Final  . POC Methamphetamine UR 12/14/2022  None Detected  NONE DETECTED (Cut Off Level 1000 ng/mL) Final  . POC Morphine 12/14/2022 None Detected  NONE DETECTED (Cut Off Level 300 ng/mL) Final  . POC Methadone UR 12/14/2022 None Detected  NONE DETECTED (Cut Off Level 300 ng/mL) Final  . POC Oxycodone UR 12/14/2022 None Detected  NONE DETECTED (Cut Off Level 100 ng/mL) Final  . POC Marijuana UR 12/14/2022 None Detected  NONE DETECTED (Cut Off Level 50 ng/mL) Final  Admission on 12/05/2022, Discharged on 12/06/2022  Component Date Value Ref Range Status  . Glucose-Capillary 12/05/2022 107 (H)  70 - 99 mg/dL Final   Glucose reference range applies only to samples taken after fasting for at least 8 hours.  . Sodium 12/05/2022 137  135 - 145 mmol/L Final  . Potassium 12/05/2022 3.7  3.5 - 5.1 mmol/L Final  . Chloride 12/05/2022 108  98 - 111 mmol/L Final  . CO2 12/05/2022 18 (L)  22 - 32 mmol/L Final  . Glucose, Bld 12/05/2022 114 (H)  70 - 99 mg/dL Final   Glucose reference range applies only to samples taken after fasting for at least 8 hours.  . BUN 12/05/2022 7  6 - 20 mg/dL Final  . Creatinine, Ser 12/05/2022 0.79  0.44 - 1.00 mg/dL Final  . Calcium 62/95/2841 8.7 (L)  8.9 - 10.3 mg/dL Final  . Total Protein 12/05/2022 6.5  6.5 - 8.1 g/dL Final  . Albumin 32/44/0102 3.3 (L)  3.5 - 5.0 g/dL Final  . AST 72/53/6644 15  15 - 41 U/L Final  . ALT 12/05/2022 14  0 - 44 U/L Final  . Alkaline Phosphatase 12/05/2022 65  38 - 126 U/L Final  . Total Bilirubin 12/05/2022 0.4  0.3 - 1.2 mg/dL Final  . GFR, Estimated 12/05/2022 >60  >60 mL/min Final   Comment: (NOTE) Calculated using the CKD-EPI Creatinine Equation (2021)   . Anion gap 12/05/2022 11  5 - 15 Final   Performed at [facility], [address] [place], [place] [address]  . Salicylate Lvl 12/05/2022 <7.0 (L)  7.0 - 30.0 mg/dL Final   Performed at [facility], [address] [place], [place] [address]  . Acetaminophen (Tylenol), Serum 12/05/2022 <10 (L)  10 - 30  ug/mL Final   Comment: (NOTE) Therapeutic concentrations vary significantly. A range of 10-30 ug/mL  may be an effective concentration for many patients. However, some  are best treated at concentrations outside of this range. Acetaminophen concentrations >150 ug/mL at 4 hours after ingestion  and >50 ug/mL at 12 hours after ingestion are often associated with  toxic reactions.  Performed at [facility], [address], [place], [place] [address]   . Alcohol, Ethyl (B) 12/05/2022 <10  <10 mg/dL Final   Comment: (NOTE) Lowest detectable limit for serum alcohol is 10 mg/dL.  For medical purposes only. Performed at [facility], [address], [place], [place] [address]   . Opiates 12/05/2022 NONE DETECTED  NONE DETECTED Final  . Cocaine 12/05/2022 NONE DETECTED  NONE DETECTED Final  . Benzodiazepines 12/05/2022 NONE DETECTED  NONE DETECTED Final  . Amphetamines 12/05/2022 NONE DETECTED  NONE DETECTED Final  . Tetrahydrocannabinol 12/05/2022 NONE DETECTED  NONE DETECTED Final  . Barbiturates 12/05/2022 NONE DETECTED  NONE DETECTED Final   Comment: (  NOTE) DRUG SCREEN FOR MEDICAL PURPOSES ONLY.  IF CONFIRMATION IS NEEDED FOR ANY PURPOSE, NOTIFY LAB WITHIN 5 DAYS.  LOWEST DETECTABLE LIMITS FOR URINE DRUG SCREEN Drug Class                     Cutoff (ng/mL) Amphetamine and metabolites    1000 Barbiturate and metabolites    200 Benzodiazepine                 200 Opiates and metabolites        300 Cocaine and metabolites        300 THC                            50 Performed at [facility], [address], [place], [place] [address]   . WBC 12/05/2022 6.8  4.0 - 10.5 K/uL Final  . RBC 12/05/2022 4.28  3.87 - 5.11 MIL/uL Final  . Hemoglobin 12/05/2022 10.8 (L)  12.0 - 15.0 g/dL Final  . HCT 29/56/2130 35.0 (L)  36.0 - 46.0 % Final  . MCV 12/05/2022 81.8  80.0 - 100.0 fL Final  . MCH 12/05/2022 25.2 (L)  26.0 - 34.0 pg Final  . MCHC 12/05/2022 30.9  30.0 -  36.0 g/dL Final  . RDW 86/57/8469 14.0  11.5 - 15.5 % Final  . Platelets 12/05/2022 258  150 - 400 K/uL Final  . nRBC 12/05/2022 0.0  0.0 - 0.2 % Final  . Neutrophils Relative % 12/05/2022 64  % Final  . Neutro Abs 12/05/2022 4.3  1.7 - 7.7 K/uL Final  . Lymphocytes Relative 12/05/2022 28  % Final  . Lymphs Abs 12/05/2022 1.9  0.7 - 4.0 K/uL Final  . Monocytes Relative 12/05/2022 7  % Final  . Monocytes Absolute 12/05/2022 0.5  0.1 - 1.0 K/uL Final  . Eosinophils Relative 12/05/2022 1  % Final  . Eosinophils Absolute 12/05/2022 0.1  0.0 - 0.5 K/uL Final  . Basophils Relative 12/05/2022 0  % Final  . Basophils Absolute 12/05/2022 0.0  0.0 - 0.1 K/uL Final  . Immature Granulocytes 12/05/2022 0  % Final  . Abs Immature Granulocytes 12/05/2022 0.03  0.00 - 0.07 K/uL Final   Performed at [facility], [address], [place], [place] [address]  . Preg, Serum 12/05/2022 NEGATIVE  NEGATIVE Final   Comment:        THE SENSITIVITY OF THIS METHODOLOGY IS >10 mIU/mL. Performed at [facility], [address] [place], [place] [address]   . Acetaminophen (Tylenol), Serum 12/05/2022 <10 (L)  10 - 30 ug/mL Final   Comment: (NOTE) Therapeutic concentrations vary significantly. A range of 10-30 ug/mL  may be an effective concentration for many patients. However, some  are best treated at concentrations outside of this range. Acetaminophen concentrations >150 ug/mL at 4 hours after ingestion  and >50 ug/mL at 12 hours after ingestion are often associated with  toxic reactions.  Performed at [facility], [address] [place], [place] [address]   Admission on 11/30/2022, Discharged on 12/01/2022  Component Date Value Ref Range Status  . WBC 12/01/2022 9.0  4.0 - 10.5 K/uL Final  . RBC 12/01/2022 4.16  3.87 - 5.11 MIL/uL Final  . Hemoglobin 12/01/2022 10.7 (L)  12.0 - 15.0 g/dL Final  . HCT 27/25/3664 34.3 (L)  36.0 - 46.0 % Final  . MCV 12/01/2022 82.5  80.0 - 100.0 fL  Final  . MCH 12/01/2022 25.7 (L)  26.0 - 34.0 pg Final  . MCHC 12/01/2022 31.2  30.0 - 36.0 g/dL Final  . RDW 41/66/0630 14.2  11.5 - 15.5 % Final  . Platelets 12/01/2022 263  150 - 400 K/uL Final  . nRBC 12/01/2022 0.0  0.0 - 0.2 % Final  . Neutrophils Relative % 12/01/2022 63  % Final  . Neutro Abs 12/01/2022 5.6  1.7 - 7.7 K/uL Final  . Lymphocytes Relative 12/01/2022 29  % Final  . Lymphs Abs 12/01/2022 2.6  0.7 - 4.0 K/uL Final  . Monocytes Relative 12/01/2022 7  % Final  . Monocytes Absolute 12/01/2022 0.7  0.1 - 1.0 K/uL Final  . Eosinophils Relative 12/01/2022 1  % Final  . Eosinophils Absolute 12/01/2022 0.1  0.0 - 0.5 K/uL Final  . Basophils Relative 12/01/2022 0  % Final  . Basophils Absolute 12/01/2022 0.0  0.0 - 0.1 K/uL Final  . Immature Granulocytes 12/01/2022 0  % Final  . Abs Immature Granulocytes 12/01/2022 0.04  0.00 - 0.07 K/uL Final   Performed at [facility], [address] [place], [place] [address]  . Sodium 12/01/2022 137  135 - 145 mmol/L Final  . Potassium 12/01/2022 3.6  3.5 - 5.1 mmol/L Final  . Chloride 12/01/2022 104  98 - 111 mmol/L Final  . CO2 12/01/2022 21 (L)  22 - 32 mmol/L Final  . Glucose, Bld 12/01/2022 122 (H)  70 - 99 mg/dL Final   Glucose reference range applies only to samples taken after fasting for at least 8 hours.  . BUN 12/01/2022 8  6 - 20 mg/dL Final  . Creatinine, Ser 12/01/2022 0.75  0.44 - 1.00 mg/dL Final  . Calcium 93/23/5573 8.7 (L)  8.9 - 10.3 mg/dL Final  . Total Protein 12/01/2022 6.4 (L)  6.5 - 8.1 g/dL Final  . Albumin 22/03/5425 3.3 (L)  3.5 - 5.0 g/dL Final  . AST 07/29/7626 18  15 - 41 U/L Final  . ALT 12/01/2022 16  0 - 44 U/L Final  . Alkaline Phosphatase 12/01/2022 70  38 - 126 U/L Final  . Total Bilirubin 12/01/2022 0.2 (L)  0.3 - 1.2 mg/dL Final  . GFR, Estimated 12/01/2022 >60  >60 mL/min Final   Comment: (NOTE) Calculated using the CKD-EPI Creatinine Equation (2021)   . Anion gap 12/01/2022 12  5 -  15 Final   Performed at [facility], [address] [place], [place] [address]  . Lipase 12/01/2022 27  11 - 51 U/L Final   Performed at [facility], [address], [place], [place] [address]  . Preg, Serum 12/01/2022 NEGATIVE  NEGATIVE Final   Comment:        THE SENSITIVITY OF THIS METHODOLOGY IS >10 mIU/mL. Performed at [facility], [address] [place], [place] [address]   . Color, Urine 12/01/2022 YELLOW  YELLOW Final  . APPearance 12/01/2022 CLEAR  CLEAR Final  . Specific Gravity, Urine 12/01/2022 1.013  1.005 - 1.030 Final  . pH 12/01/2022 7.0  5.0 - 8.0 Final  . Glucose, UA 12/01/2022 NEGATIVE  NEGATIVE mg/dL Final  . Hgb urine dipstick 12/01/2022 NEGATIVE  NEGATIVE Final  . Bilirubin Urine 12/01/2022 NEGATIVE  NEGATIVE Final  . Ketones, ur 12/01/2022 NEGATIVE  NEGATIVE mg/dL Final  . Protein, ur 26/94/8546 NEGATIVE  NEGATIVE mg/dL Final  . Nitrite 27/04/5007 NEGATIVE  NEGATIVE Final  .  Leukocytes,Ua 12/01/2022 NEGATIVE  NEGATIVE Final   Performed at [facility], [address] [place], [place] [address]  . Sodium 12/01/2022 140  135 - 145 mmol/L Final  . Potassium 12/01/2022 4.1  3.5 - 5.1 mmol/L Final  . Chloride 12/01/2022 104  98 - 111 mmol/L Final  . BUN 12/01/2022 8  6 - 20 mg/dL Final  . Creatinine, Ser 12/01/2022 0.70  0.44 - 1.00 mg/dL Final  . Glucose, Bld 60/45/4098 122 (H)  70 - 99 mg/dL Final   Glucose reference range applies only to samples taken after fasting for at least 8 hours.  . Calcium, Ion 12/01/2022 1.13 (L)  1.15 - 1.40 mmol/L Final  . TCO2 12/01/2022 25  22 - 32 mmol/L Final  . Hemoglobin 12/01/2022 10.9 (L)  12.0 - 15.0 g/dL Final  . HCT 11/91/4782 32.0 (L)  36.0 - 46.0 % Final  Admission on 10/19/2022, Discharged on 10/31/2022  Component Date Value Ref Range Status  . Preg Test, Ur 10/21/2022 NEGATIVE  NEGATIVE Final   Comment:        THE SENSITIVITY OF THIS METHODOLOGY IS >25 mIU/mL. Performed at [facility], [address], [place], [place] [address]   . TSH 10/22/2022 2.850  0.350 - 4.500 uIU/mL Final   Comment: Performed by a 3rd Generation assay with a functional sensitivity of <=0.01 uIU/mL. Performed at [facility], [address] [place], [place] [address]   . Cholesterol 10/22/2022 192  0 - 200 mg/dL Final  . Triglycerides 10/22/2022 143  <150 mg/dL Final  . HDL 78/46/9629 46  >40 mg/dL Final  . Total CHOL/HDL Ratio 10/22/2022 4.2  RATIO Final  . VLDL 10/22/2022 29  0 - 40 mg/dL Final  . LDL Cholesterol 10/22/2022 117 (H)  0 - 99 mg/dL Final   Comment:        Total Cholesterol/HDL:CHD Risk Coronary Heart Disease Risk Table                     Men   Women  1/2 Average Risk   3.4   3.3  Average Risk       5.0   4.4  2 X Average Risk   9.6   7.1  3 X Average Risk  23.4   11.0        Use the calculated Patient Ratio above and the CHD Risk Table to determine the patient's CHD Risk.        ATP III CLASSIFICATION (LDL):  <100     mg/dL   Optimal  528-413  mg/dL   Near or Above                    Optimal  130-159  mg/dL   Borderline  244-010  mg/dL   High  >272     mg/dL   Very High Performed at [facility], [address] [place], [place] [address]   . Hgb A1c MFr Bld 10/22/2022 5.4  4.8 - 5.6 % Final   Comment: (NOTE)         Prediabetes: 5.7 - 6.4         Diabetes: >6.4         Glycemic control for adults with diabetes: <7.0   . Mean Plasma Glucose 10/22/2022 108  mg/dL Final   Comment: (NOTE) Performed At: [facility] [address] [place], [place] [id number] Jolene Schimke MD DG:[id number]   .  Vit D, 25-Hydroxy 10/22/2022 31.39  30 - 100 ng/mL Final   Comment: (NOTE) Vitamin D deficiency has been defined by the Institute of Medicine  and an Endocrine Society practice guideline as a level of serum 25-OH  vitamin D less than 20 ng/mL (1,2). The Endocrine Society went on to  further define vitamin  D insufficiency as a level between 21 and 29  ng/mL (2).  1. IOM (Institute of Medicine). 2010. Dietary reference intakes for  calcium and D. [place]: [employer]. 2. Holick MF, Binkley , Bischoff-Ferrari HA, et al. Evaluation,  treatment, and prevention of vitamin D deficiency: an Endocrine  Society clinical practice guideline, JCEM. 2011 Jul; 96(7): 1911-30.  Performed at [facility], [address] [place], [place] [address]   No results displayed because visit has over 200 results.    Admission on 08/20/2022, Discharged on 08/22/2022  Component Date Value Ref Range Status  . WBC 08/20/2022 8.2  4.0 - 10.5 K/uL Final  . RBC 08/20/2022 4.80  3.87 - 5.11 MIL/uL Final  . Hemoglobin 08/20/2022 11.9 (L)  12.0 - 15.0 g/dL Final  . HCT 60/45/4098 38.5  36.0 - 46.0 % Final  . MCV 08/20/2022 80.2  80.0 - 100.0 fL Final  . MCH 08/20/2022 24.8 (L)  26.0 - 34.0 pg Final  . MCHC 08/20/2022 30.9  30.0 - 36.0 g/dL Final  . RDW 11/91/4782 14.9  11.5 - 15.5 % Final  . Platelets 08/20/2022 372  150 - 400 K/uL Final  . nRBC 08/20/2022 0.0  0.0 - 0.2 % Final  . Neutrophils Relative % 08/20/2022 64  % Final  . Neutro Abs 08/20/2022 5.2  1.7 - 7.7 K/uL Final  . Lymphocytes Relative 08/20/2022 28  % Final  . Lymphs Abs 08/20/2022 2.3  0.7 - 4.0 K/uL Final  . Monocytes Relative 08/20/2022 6  % Final  . Monocytes Absolute 08/20/2022 0.5  0.1 - 1.0 K/uL Final  . Eosinophils Relative 08/20/2022 1  % Final  . Eosinophils Absolute 08/20/2022 0.1  0.0 - 0.5 K/uL Final  . Basophils Relative 08/20/2022 1  % Final  . Basophils Absolute 08/20/2022 0.1  0.0 - 0.1 K/uL Final  . Immature Granulocytes 08/20/2022 0  % Final  . Abs Immature Granulocytes 08/20/2022 0.03  0.00 - 0.07 K/uL Final   Performed at [facility], [address], [place], [place] [address]  . Sodium 08/20/2022 138  135 - 145 mmol/L Final  . Potassium 08/20/2022 3.6  3.5 - 5.1 mmol/L Final  . Chloride  08/20/2022 107  98 - 111 mmol/L Final  . CO2 08/20/2022 22  22 - 32 mmol/L Final  . Glucose, Bld 08/20/2022 90  70 - 99 mg/dL Final   Glucose reference range applies only to samples taken after fasting for at least 8 hours.  . BUN 08/20/2022 10  6 - 20 mg/dL Final  . Creatinine, Ser 08/20/2022 0.70  0.44 - 1.00 mg/dL Final  . Calcium 30/86/5784 9.3  8.9 - 10.3 mg/dL Final  . Total Protein 08/20/2022 7.7  6.5 - 8.1 g/dL Final  . Albumin 69/62/9528 3.9  3.5 - 5.0 g/dL Final  . AST 41/32/4401 20  15 - 41 U/L Final  . ALT 08/20/2022 19  0 - 44 U/L Final  . Alkaline Phosphatase 08/20/2022 84  38 - 126 U/L Final  . Total Bilirubin 08/20/2022 0.1 (L)  0.3 - 1.2 mg/dL Final  . GFR, Estimated 08/20/2022 >  60  >60 mL/min Final   Comment: (NOTE) Calculated using the CKD-EPI Creatinine Equation (2021)   . Anion gap 08/20/2022 9  5 - 15 Final   Performed at [facility], [address], [place], [place] [address]  . Preg Test, Ur 08/20/2022 Negative  Negative Final  . POC Amphetamine UR 08/20/2022 None Detected  NONE DETECTED (Cut Off Level 1000 ng/mL) Final  . POC Secobarbital (BAR) 08/20/2022 None Detected  NONE DETECTED (Cut Off Level 300 ng/mL) Final  . POC Buprenorphine (BUP) 08/20/2022 None Detected  NONE DETECTED (Cut Off Level 10 ng/mL) Final  . POC Oxazepam (BZO) 08/20/2022 None Detected  NONE DETECTED (Cut Off Level 300 ng/mL) Final  . POC Cocaine UR 08/20/2022 None Detected  NONE DETECTED (Cut Off Level 300 ng/mL) Final  . POC Methamphetamine UR 08/20/2022 None Detected  NONE DETECTED (Cut Off Level 1000 ng/mL) Final  . POC Morphine 08/20/2022 None Detected  NONE DETECTED (Cut Off Level 300 ng/mL) Final  . POC Methadone UR 08/20/2022 None Detected  NONE DETECTED (Cut Off Level 300 ng/mL) Final  . POC Oxycodone UR 08/20/2022 None Detected  NONE DETECTED (Cut Off Level 100 ng/mL) Final  . POC Marijuana UR 08/20/2022 None Detected  NONE DETECTED (Cut Off Level 50 ng/mL) Final  . Preg  Test, Ur 08/20/2022 NEGATIVE  NEGATIVE Final   Comment:        THE SENSITIVITY OF THIS METHODOLOGY IS >24 mIU/mL   Admission on 08/08/2022, Discharged on 08/12/2022  Component Date Value Ref Range Status  . Sodium 08/09/2022 136  135 - 145 mmol/L Final  . Potassium 08/09/2022 4.1  3.5 - 5.1 mmol/L Final  . Chloride 08/09/2022 105  98 - 111 mmol/L Final  . CO2 08/09/2022 23  22 - 32 mmol/L Final  . Glucose, Bld 08/09/2022 109 (H)  70 - 99 mg/dL Final   Glucose reference range applies only to samples taken after fasting for at least 8 hours.  . BUN 08/09/2022 13  6 - 20 mg/dL Final  . Creatinine, Ser 08/09/2022 0.69  0.44 - 1.00 mg/dL Final  . Calcium 91/47/8295 9.0  8.9 - 10.3 mg/dL Final  . Total Protein 08/09/2022 7.0  6.5 - 8.1 g/dL Final  . Albumin 62/13/0865 3.5  3.5 - 5.0 g/dL Final  . AST 78/46/9629 20  15 - 41 U/L Final  . ALT 08/09/2022 22  0 - 44 U/L Final  . Alkaline Phosphatase 08/09/2022 67  38 - 126 U/L Final  . Total Bilirubin 08/09/2022 0.3  0.3 - 1.2 mg/dL Final  . GFR, Estimated 08/09/2022 >60  >60 mL/min Final   Comment: (NOTE) Calculated using the CKD-EPI Creatinine Equation (2021)   . Anion gap 08/09/2022 8  5 - 15 Final   Performed at [facility], [address] [place], [place] [address]  . Alcohol, Ethyl (B) 08/09/2022 <10  <10 mg/dL Final   Comment: (NOTE) Lowest detectable limit for serum alcohol is 10 mg/dL.  For medical purposes only. Performed at [facility], [address] [place] [address], [place], [place] [address]   . Opiates 08/09/2022 NONE DETECTED  NONE DETECTED Final  . Cocaine 08/09/2022 NONE DETECTED  NONE DETECTED Final  . Benzodiazepines 08/09/2022 NONE DETECTED  NONE DETECTED Final  . Amphetamines 08/09/2022 NONE DETECTED  NONE DETECTED Final  . Tetrahydrocannabinol 08/09/2022 NONE DETECTED  NONE DETECTED Final  . Barbiturates 08/09/2022 NONE DETECTED  NONE DETECTED Final   Comment: (NOTE) DRUG SCREEN  FOR  MEDICAL PURPOSES ONLY.  IF CONFIRMATION IS NEEDED FOR ANY PURPOSE, NOTIFY LAB WITHIN 5 DAYS.  LOWEST DETECTABLE LIMITS FOR URINE DRUG SCREEN Drug Class                     Cutoff (ng/mL) Amphetamine and metabolites    1000 Barbiturate and metabolites    200 Benzodiazepine                 200 Opiates and metabolites        300 Cocaine and metabolites        300 THC                            50 Performed at [facility], [address] [place], [place] [address]   . Preg, Serum 08/09/2022 NEGATIVE  NEGATIVE Final   Comment:        THE SENSITIVITY OF THIS METHODOLOGY IS >10 mIU/mL. Performed at [facility], [address], [place], [place] [address]   . Salicylate Lvl 08/09/2022 <7.0 (L)  7.0 - 30.0 mg/dL Final   Performed at [facility], [address] [place], [place] [address]  . Acetaminophen (Tylenol), Serum 08/09/2022 <10 (L)  10 - 30 ug/mL Final   Comment: (NOTE) Therapeutic concentrations vary significantly. A range of 10-30 ug/mL  may be an effective concentration for many patients. However, some  are best treated at concentrations outside of this range. Acetaminophen concentrations >150 ug/mL at 4 hours after ingestion  and >50 ug/mL at 12 hours after ingestion are often associated with  toxic reactions.  Performed at [facility], [address] [place], [place] [address]   . WBC 08/09/2022 10.2  4.0 - 10.5 K/uL Final  . RBC 08/09/2022 4.06  3.87 - 5.11 MIL/uL Final  . Hemoglobin 08/09/2022 10.1 (L)  12.0 - 15.0 g/dL Final  . HCT 46/96/2952 32.4 (L)  36.0 - 46.0 % Final  . MCV 08/09/2022 79.8 (L)  80.0 - 100.0 fL Final  . MCH 08/09/2022 24.9 (L)  26.0 - 34.0 pg Final  . MCHC 08/09/2022 31.2  30.0 - 36.0 g/dL Final  . RDW 84/13/2440 15.2  11.5 - 15.5 % Final  . Platelets 08/09/2022 304  150 - 400 K/uL Final  . nRBC 08/09/2022 0.0  0.0 - 0.2 % Final  . Neutrophils Relative %  08/09/2022 67  % Final  . Neutro Abs 08/09/2022 6.9  1.7 - 7.7 K/uL Final  . Lymphocytes Relative 08/09/2022 26  % Final  . Lymphs Abs 08/09/2022 2.6  0.7 - 4.0 K/uL Final  . Monocytes Relative 08/09/2022 5  % Final  . Monocytes Absolute 08/09/2022 0.6  0.1 - 1.0 K/uL Final  . Eosinophils Relative 08/09/2022 1  % Final  . Eosinophils Absolute 08/09/2022 0.1  0.0 - 0.5 K/uL Final  . Basophils Relative 08/09/2022 1  % Final  . Basophils Absolute 08/09/2022 0.1  0.0 - 0.1 K/uL Final  . Immature Granulocytes 08/09/2022 0  % Final  . Abs Immature Granulocytes 08/09/2022 0.03  0.00 - 0.07 K/uL Final   Performed at [facility], [address] [place], [place] [address]  . Acetaminophen (Tylenol), Serum 08/09/2022 <10 (L)  10 - 30 ug/mL Final   Comment: (NOTE) Therapeutic concentrations vary significantly. A range of 10-30 ug/mL  may be an effective concentration for many patients. However, some  are best treated at concentrations outside of this range. Acetaminophen concentrations >150 ug/mL at 4 hours after ingestion  and >50 ug/mL at 12 hours after ingestion are often associated with  toxic reactions.  Performed at [facility], [address], [place], [place] [address]   . Glucose-Capillary 08/09/2022 120 (H)  70 - 99 mg/dL Final   Glucose reference range applies only to samples taken after fasting for at least 8 hours.  . Sodium 08/09/2022 135  135 - 145 mmol/L Final  . Potassium 08/09/2022 4.2  3.5 - 5.1 mmol/L Final  . Chloride 08/09/2022 105  98 - 111 mmol/L Final  . CO2 08/09/2022 24  22 - 32 mmol/L Final  . Glucose, Bld 08/09/2022 110 (H)  70 - 99 mg/dL Final   Glucose reference range applies only to samples taken after fasting for at least 8 hours.  . BUN 08/09/2022 13  6 - 20 mg/dL Final  . Creatinine, Ser 08/09/2022 0.88  0.44 - 1.00 mg/dL Final  . Calcium 40/98/1191 9.0  8.9 - 10.3 mg/dL Final  . GFR, Estimated 08/09/2022 >60  >60  mL/min Final   Comment: (NOTE) Calculated using the CKD-EPI Creatinine Equation (2021)   . Anion gap 08/09/2022 6  5 - 15 Final   Performed at [facility], [address], [place], [place] [address]  . pH, Ven 08/09/2022 7.37  7.25 - 7.43 Final  . pCO2, Ven 08/09/2022 52  44 - 60 mmHg Final  . pO2, Ven 08/09/2022 <31 (LL)  32 - 45 mmHg Final   Comment: CRITICAL RESULT CALLED TO, READ BACK BY AND VERIFIED WITH: FOX B. RN AT 1202 ON 08/09/2022 BY MECIAL J.   . Bicarbonate 08/09/2022 30.1 (H)  20.0 - 28.0 mmol/L Final  . Acid-Base Excess 08/09/2022 3.6 (H)  0.0 - 2.0 mmol/L Final  . O2 Saturation 08/09/2022 38.3  % Final  . Patient temperature 08/09/2022 37.0   Final   Performed at [facility], [address] [place], [place] [address]  . Ammonia 08/09/2022 13  9 - 35 umol/L Final   Performed at [facility], [address] [place], [place] [address]  . Magnesium 08/09/2022 1.8  1.7 - 2.4 mg/dL Final   Performed at [facility], [address] [place], [place] [address]  Admission on 08/04/2022, Discharged on 08/05/2022  Component Date Value Ref Range Status  . Sodium 08/04/2022 143  135 - 145 mmol/L Final  . Potassium 08/04/2022 4.1  3.5 - 5.1 mmol/L Final  . Chloride 08/04/2022 108  98 - 111 mmol/L Final  . CO2 08/04/2022 19 (L)  22 - 32 mmol/L Final  . Glucose, Bld 08/04/2022 145 (H)  70 - 99 mg/dL Final   Glucose reference range applies only to samples taken after fasting for at least 8 hours.  . BUN 08/04/2022 10  6 - 20 mg/dL Final  . Creatinine, Ser 08/04/2022 0.74  0.44 - 1.00 mg/dL Final  . Calcium 29/52/8413 9.3  8.9 - 10.3 mg/dL Final  . GFR, Estimated 08/04/2022 >60  >60 mL/min Final   Comment: (NOTE) Calculated using the CKD-EPI Creatinine Equation (2021)   . Anion gap 08/04/2022 16 (H)  5 - 15 Final   Performed at [facility], [address], [place], [place] [address]  . WBC  08/04/2022 10.2  4.0 - 10.5 K/uL Final  . RBC 08/04/2022 4.68  3.87 - 5.11 MIL/uL Final  .  Hemoglobin 08/04/2022 11.2 (L)  12.0 - 15.0 g/dL Final  . HCT 45/40/9811 37.6  36.0 - 46.0 % Final  . MCV 08/04/2022 80.3  80.0 - 100.0 fL Final  . MCH 08/04/2022 23.9 (L)  26.0 - 34.0 pg Final  . MCHC 08/04/2022 29.8 (L)  30.0 - 36.0 g/dL Final  . RDW 91/47/8295 15.1  11.5 - 15.5 % Final  . Platelets 08/04/2022 329  150 - 400 K/uL Final  . nRBC 08/04/2022 0.0  0.0 - 0.2 % Final   Performed at [facility], [address] [place], [place] [address]  . Preg, Serum 08/04/2022 NEGATIVE  NEGATIVE Final   Performed at [facility], [address], [place], [place] [address]  . D-Dimer, Quant 08/04/2022 <0.27  0.00 - 0.50 ug/mL-FEU Final   Comment: (NOTE) At the manufacturer cut-off value of 0.5 g/mL FEU, this assay has a negative predictive value of 95-100%.This assay is intended for use in conjunction with a clinical pretest probability (PTP) assessment model to exclude pulmonary embolism (PE) and deep venous thrombosis (DVT) in outpatients suspected of PE or DVT. Results should be correlated with clinical presentation. Performed at [facility], [address] [name], [place] [address]   . Total Protein 08/04/2022 6.7  6.5 - 8.1 g/dL Final  . Albumin 95/28/4132 3.4 (L)  3.5 - 5.0 g/dL Final  . AST 44/02/270 23  15 - 41 U/L Final  . ALT 08/04/2022 25  0 - 44 U/L Final  . Alkaline Phosphatase 08/04/2022 65  38 - 126 U/L Final  . Total Bilirubin 08/04/2022 0.4  0.3 - 1.2 mg/dL Final  . Bilirubin, Direct 08/04/2022 <0.1  0.0 - 0.2 mg/dL Final  . Indirect Bilirubin 08/04/2022 NOT CALCULATED  0.3 - 0.9 mg/dL Final   Performed at [facility], [address], [place], [place] [address]  . Opiates 08/04/2022 NONE DETECTED  NONE DETECTED Final  . Cocaine 08/04/2022 NONE DETECTED  NONE DETECTED Final  . Benzodiazepines 08/04/2022 NONE DETECTED  NONE DETECTED Final  .  Amphetamines 08/04/2022 NONE DETECTED  NONE DETECTED Final  . Tetrahydrocannabinol 08/04/2022 NONE DETECTED  NONE DETECTED Final  . Barbiturates 08/04/2022 NONE DETECTED  NONE DETECTED Final   Comment: (NOTE) DRUG SCREEN FOR MEDICAL PURPOSES ONLY.  IF CONFIRMATION IS NEEDED FOR ANY PURPOSE, NOTIFY LAB WITHIN 5 DAYS.  LOWEST DETECTABLE LIMITS FOR URINE DRUG SCREEN Drug Class                     Cutoff (ng/mL) Amphetamine and metabolites    1000 Barbiturate and metabolites    200 Benzodiazepine                 200 Opiates and metabolites        300 Cocaine and metabolites        300 THC                            50 Performed at [facility], [address] [place], [place] [address]   Admission on 07/29/2022, Discharged on 08/02/2022  Component Date Value Ref Range Status  . Iron 07/31/2022 50  28 - 170 ug/dL Final  . TIBC 42/59/5638 409  250 - 450 ug/dL Final  . Saturation Ratios 07/31/2022 12  10.4 - 31.8 % Final  . UIBC 07/31/2022 359  ug/dL Final   Performed at [facility], [address], [place], [place] [address]  . Ferritin 07/31/2022 7 (L)  11 - 307 ng/mL Final   Performed at [facility], [address], [place], [place] [address]  Admission on 07/29/2022, Discharged on 07/29/2022  Component Date Value Ref Range Status  . Sodium 07/29/2022 137  135 - 145 mmol/L Final  . Potassium 07/29/2022 3.7  3.5 - 5.1 mmol/L Final  . Chloride 07/29/2022 102  98 - 111 mmol/L Final  . CO2 07/29/2022 23  22 - 32 mmol/L Final  . Glucose, Bld 07/29/2022 101 (H)  70 - 99 mg/dL Final   Glucose reference range applies only to samples taken after fasting for at least 8 hours.  . BUN 07/29/2022 7  6 - 20 mg/dL Final  . Creatinine, Ser 07/29/2022 0.76  0.44 - 1.00 mg/dL Final  . Calcium 09/81/1914 9.0  8.9 - 10.3 mg/dL Final  . Total Protein 07/29/2022 7.2  6.5 - 8.1 g/dL Final  . Albumin 78/29/5621 3.8  3.5 - 5.0 g/dL Final  . AST 30/86/5784  19  15 - 41 U/L Final  . ALT 07/29/2022 16  0 - 44 U/L Final  . Alkaline Phosphatase 07/29/2022 75  38 - 126 U/L Final  . Total Bilirubin 07/29/2022 0.3  0.3 - 1.2 mg/dL Final  . GFR, Estimated 07/29/2022 >60  >60 mL/min Final   Comment: (NOTE) Calculated using the CKD-EPI Creatinine Equation (2021)   . Anion gap 07/29/2022 12  5 - 15 Final   Performed at [facility], [address] [place], [place] [address]  . Alcohol, Ethyl (B) 07/29/2022 <10  <10 mg/dL Final   Comment: (NOTE) Lowest detectable limit for serum alcohol is 10 mg/dL.  For medical purposes only. Performed at [facility], [address] [place], [place] [address]   . Salicylate Lvl 07/29/2022 <7.0 (L)  7.0 - 30.0 mg/dL Final   Performed at [facility], [address], [place], [place] [address]  . Acetaminophen (Tylenol), Serum 07/29/2022 <10 (L)  10 - 30 ug/mL Final   Comment: (NOTE) Therapeutic concentrations vary significantly. A range of 10-30 ug/mL  may be an effective concentration for many patients. However, some  are best treated at concentrations outside of this range. Acetaminophen concentrations >150 ug/mL at 4 hours after ingestion  and >50 ug/mL at 12 hours after ingestion are often associated with  toxic reactions.  Performed at [facility], [address], [place], [place] [address]   . WBC 07/29/2022 10.8 (H)  4.0 - 10.5 K/uL Final  . RBC 07/29/2022 4.52  3.87 - 5.11 MIL/uL Final  . Hemoglobin 07/29/2022 11.0 (L)  12.0 - 15.0 g/dL Final  . HCT 53/66/4403 35.9 (L)  36.0 - 46.0 % Final  . MCV 07/29/2022 79.4 (L)  80.0 - 100.0 fL Final  . MCH 07/29/2022 24.3 (L)  26.0 - 34.0 pg Final  . MCHC 07/29/2022 30.6  30.0 - 36.0 g/dL Final  . RDW 47/42/5956 14.6  11.5 - 15.5 % Final  . Platelets 07/29/2022 288  150 - 400 K/uL Final  . nRBC 07/29/2022 0.0  0.0 - 0.2 % Final   Performed at [facility], [address] [place], [place] [address]  . Opiates 07/29/2022 NONE  DETECTED  NONE DETECTED Final  . Cocaine 07/29/2022 NONE DETECTED  NONE DETECTED Final  . Benzodiazepines 07/29/2022 NONE DETECTED  NONE DETECTED Final  . Amphetamines 07/29/2022  NONE DETECTED  NONE DETECTED Final  . Tetrahydrocannabinol 07/29/2022 NONE DETECTED  NONE DETECTED Final  . Barbiturates 07/29/2022 NONE DETECTED  NONE DETECTED Final   Comment: (NOTE) DRUG SCREEN FOR MEDICAL PURPOSES ONLY.  IF CONFIRMATION IS NEEDED FOR ANY PURPOSE, NOTIFY LAB WITHIN 5 DAYS.  LOWEST DETECTABLE LIMITS FOR URINE DRUG SCREEN Drug Class                     Cutoff (ng/mL) Amphetamine and metabolites    1000 Barbiturate and metabolites    200 Benzodiazepine                 200 Opiates and metabolites        300 Cocaine and metabolites        300 THC                            50 Performed at [facility], [address], [place], [place] [address]   . Preg Test, Ur 07/29/2022 NEGATIVE  NEGATIVE Final   Comment:        THE SENSITIVITY OF THIS METHODOLOGY IS >25 mIU/mL. Performed at [facility], [address] [place], [place] [address]   There may be more visits with results that are not included.    Allergies: Bee venom, Coconut flavor, Fish allergy, Geodon [ziprasidone hcl], Haloperidol and related, Lithobid [lithium], Roxicodone [oxycodone], Seroquel [quetiapine], Shellfish allergy, Phenergan [promethazine hcl], Prilosec [omeprazole], Sulfa antibiotics, Tegretol [carbamazepine], Prozac [fluoxetine], Tape, and Tylenol [acetaminophen]  Medications:  Facility Ordered Medications  Medication  . alum & mag hydroxide-simeth (MAALOX/MYLANTA) 200-200-20 MG/5ML suspension 30 mL  . magnesium hydroxide (MILK OF MAGNESIA) suspension 30 mL  . hydrOXYzine (ATARAX) tablet 25 mg  . traZODone (DESYREL) tablet 50 mg  . albuterol (VENTOLIN HFA) 108 (90 Base) MCG/ACT inhaler 2 puff  . ARIPiprazole (ABILIFY) tablet 2 mg  . cyclobenzaprine (FLEXERIL) tablet 10 mg  . DULoxetine (CYMBALTA) DR  capsule 60 mg  . mometasone-formoterol (DULERA) 200-5 MCG/ACT inhaler 2 puff  . OLANZapine (ZYPREXA) tablet 15 mg  . [START ON 12/15/2022] pantoprazole (PROTONIX) EC tablet 40 mg  . prazosin (MINIPRESS) capsule 1 mg  . propafenone (RYTHMOL SR) 12 hr capsule 225 mg  . [START ON 12/15/2022] OLANZapine (ZYPREXA) tablet 5 mg  . ibuprofen (ADVIL) tablet 600 mg  . diclofenac Sodium (VOLTAREN) 1 % topical gel 2 g  . [START ON 12/15/2022] gabapentin (NEURONTIN) capsule 600 mg   PTA Medications  Medication Sig  . ibuprofen (ADVIL) 600 MG tablet Take 1 tablet (600 mg total) by mouth 3 (three) times daily as needed for mild pain or moderate pain.  Marland Kitchen diclofenac Sodium (VOLTAREN) 1 % GEL Apply 2 g topically 4 (four) times daily. (Patient taking differently: Apply 2 g topically in the morning, at noon, and at bedtime.)  . ferrous sulfate 325 (65 FE) MG tablet Take 1 tablet (325 mg total) by mouth 2 (two) times daily with a meal. (Patient not taking: Reported on 12/05/2022)  . senna-docusate (SENOKOT-S) 8.6-50 MG tablet Take 1 tablet by mouth 2 (two) times daily as needed for moderate constipation. (Patient not taking: Reported on 12/05/2022)  . metoprolol tartrate (LOPRESSOR) 25 MG tablet Take 1 tablet (25 mg total) by mouth every 12 (twelve) hours for 7 days.  . prazosin (MINIPRESS) 1 MG capsule Take 1 capsule (1 mg total) by mouth at bedtime for 7 days.  . ARIPiprazole (  ABILIFY) 2 MG tablet Take 1 tablet (2 mg total) by mouth daily for 7 days.  . DULoxetine (CYMBALTA) 60 MG capsule Take 1 capsule (60 mg total) by mouth daily for 7 days.  Marland Kitchen OLANZapine (ZYPREXA) 5 MG tablet Take 3 tablets (15 mg total) by mouth at bedtime for 7 days. (Patient taking differently: Take 5 mg by mouth in the morning and at bedtime. Take 1 tablet at 1200 and 1700)  . polyethylene glycol (MIRALAX / GLYCOLAX) 17 g packet Take 17 g by mouth 2 (two) times daily as needed for mild constipation. (Patient not taking: Reported on  12/05/2022)  . pantoprazole (PROTONIX) 40 MG tablet Take 1 tablet (40 mg total) by mouth daily before breakfast for 7 days.  Marland Kitchen albuterol (VENTOLIN HFA) 108 (90 Base) MCG/ACT inhaler Inhale 2 puffs into the lungs every 6 (six) hours as needed for wheezing or shortness of breath.  . mometasone-formoterol (DULERA) 200-5 MCG/ACT AERO Inhale 2 puffs into the lungs 2 (two) times daily.  Marland Kitchen gabapentin (NEURONTIN) 600 MG tablet Take 600 mg by mouth 3 (three) times daily.  Marland Kitchen OLANZapine (ZYPREXA) 15 MG tablet Take 15 mg by mouth at bedtime.  . cyclobenzaprine (FLEXERIL) 10 MG tablet Take 10 mg by mouth at bedtime.  . hydrOXYzine (ATARAX) 25 MG tablet Take 25 mg by mouth 3 (three) times daily.  . propafenone (RYTHMOL SR) 225 MG 12 hr capsule Take 225 mg by mouth 2 (two) times daily.      Medical Decision Making  ***    Recommendations  {[facility] MSE Recommendations:304701}  Maricela Bo, NP 12/14/22  11:59 PM

## 2022-12-15 ENCOUNTER — Other Ambulatory Visit: Payer: Self-pay

## 2022-12-15 DIAGNOSIS — F250 Schizoaffective disorder, bipolar type: Secondary | ICD-10-CM | POA: Diagnosis not present

## 2022-12-15 LAB — LIPID PANEL
Cholesterol: 188 mg/dL (ref 0–200)
HDL: 41 mg/dL (ref >40)
LDL Cholesterol: 103 mg/dL — ABNORMAL HIGH (ref 0–99)
Total CHOL/HDL Ratio: 4.6 {ratio}
Triglycerides: 222 mg/dL — ABNORMAL HIGH (ref <150)
VLDL: 44 mg/dL — ABNORMAL HIGH (ref 0–40)

## 2022-12-15 LAB — COMPREHENSIVE METABOLIC PANEL
ALT: 19 U/L (ref 0–44)
AST: 19 U/L (ref 15–41)
Albumin: 3.7 g/dL (ref 3.5–5.0)
Alkaline Phosphatase: 89 U/L (ref 38–126)
Anion gap: 10 (ref 5–15)
BUN: <5 mg/dL — ABNORMAL LOW (ref 6–20)
CO2: 24 mmol/L (ref 22–32)
Calcium: 9.2 mg/dL (ref 8.9–10.3)
Chloride: 106 mmol/L (ref 98–111)
Creatinine, Ser: 0.78 mg/dL (ref 0.44–1.00)
GFR, Estimated: >60 mL/min (ref >60)
Glucose, Bld: 82 mg/dL (ref 70–99)
Potassium: 3.9 mmol/L (ref 3.5–5.1)
Sodium: 140 mmol/L (ref 135–145)
Total Bilirubin: 0.4 mg/dL (ref <1.2)
Total Protein: 6.6 g/dL (ref 6.5–8.1)

## 2022-12-15 LAB — CBC WITH DIFFERENTIAL/PLATELET
Abs Immature Granulocytes: 0.03 10*3/uL (ref 0.00–0.07)
Basophils Absolute: 0 10*3/uL (ref 0.0–0.1)
Basophils Relative: 1 %
Eosinophils Absolute: 0.1 10*3/uL (ref 0.0–0.5)
Eosinophils Relative: 1 %
HCT: 37.7 % (ref 36.0–46.0)
Hemoglobin: 11.9 g/dL — ABNORMAL LOW (ref 12.0–15.0)
Immature Granulocytes: 0 %
Lymphocytes Relative: 26 %
Lymphs Abs: 2.2 10*3/uL (ref 0.7–4.0)
MCH: 26 pg (ref 26.0–34.0)
MCHC: 31.6 g/dL (ref 30.0–36.0)
MCV: 82.3 fL (ref 80.0–100.0)
Monocytes Absolute: 0.6 10*3/uL (ref 0.1–1.0)
Monocytes Relative: 6 %
Neutro Abs: 5.6 10*3/uL (ref 1.7–7.7)
Neutrophils Relative %: 66 %
Platelets: 367 10*3/uL (ref 150–400)
RBC: 4.58 MIL/uL (ref 3.87–5.11)
RDW: 13.7 % (ref 11.5–15.5)
WBC: 8.6 10*3/uL (ref 4.0–10.5)
nRBC: 0 % (ref 0.0–0.2)

## 2022-12-15 LAB — TSH: TSH: 2.483 u[IU]/mL (ref 0.350–4.500)

## 2022-12-15 LAB — ETHANOL: Alcohol, Ethyl (B): <10 mg/dL (ref <10)

## 2022-12-15 NOTE — ED Notes (Signed)
Pt A&O x 4, presents with suicidal thoughts, plan to overdose on medications.  Comfort measures given.  Pt anxious requesting her home meds.  Cooperative.  Monitoring for safety.

## 2022-12-15 NOTE — ED Notes (Signed)
Patient was provided lunch.

## 2022-12-15 NOTE — ED Notes (Signed)
Patient was provided breakfast

## 2022-12-15 NOTE — Progress Notes (Signed)
[facility] MD Progress Note  12/15/2022 10:13 AM [name]  MRN:  [id number]  Subjective:  [name] 32 year old Caucasian female well-known to this service.  Presented due to suicidal ideations with a plan to overdose.  During this assessment patient states frustration related to her medications not helping. Stated thta she doesn't care for her roommate.  She reported suicidal ideations with a plan to overdose and auditory hallucinations telling her to hurt herself.  Was reported that patient was recently discharged from [facility].  [name] currently carries a diagnosis with posttraumatic stress disorder, borderline personality disorder, schizoaffective both bipolar type, autism spectrum disorder and major depressive disorder.  She reports she has been compliant with medications.  [name] is resting in bed; she is alert/oriented x 4; calm/cooperative; and mood congruent with affect.  Patient is speaking in a clear tone at moderate volume, and normal pace; with good eye contact. Her thought process is coherent and relevant; There is no indication that she is currently responding to internal/external stimuli or experiencing delusional thought content.  Patient denies psychosis, and paranoia.  Patient has remained calm throughout assessment and has answered questions appropriately.   Principal Problem: Passive suicidal ideations Diagnosis: Principal Problem:   Passive suicidal ideations Active Problems:   Borderline personality disorder (HCC)  Total Time spent with patient: 15 minutes  Past Psychiatric History:   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  fragile X syndrome    Chronic constipation    Depression    Drug-seeking behavior    Essential tremor    Headache    Ineffective individual coping 05/16/2022   Insomnia 01/12/2022   Intentional drug overdose (HCC) 06/05/2022   Neuromuscular disorder (HCC)    Normocytic anemia 06/05/2022   Overdose 07/22/2017   Overdose of acetaminophen 07/2017   and other meds   Overdose, intentional self-harm, initial encounter (HCC) 07/20/2021   Paranoia (HCC) 04/22/2021   Personality disorder (HCC)    Purposeful non-suicidal drug ingestion (HCC) 06/27/2021   Schizo-affective psychosis (HCC)    Schizoaffective disorder (HCC) 07/29/2022   Schizoaffective disorder, bipolar type (HCC)    Seizures (HCC)    Last seizure December 2017   Skin erythema 04/27/2022   Sleep apnea    Suicidal behavior 07/25/2021   Suicidal ideation    Suicide (HCC) 07/01/2021   Suicide attempt (HCC) 07/04/2021    Past Surgical History:  Procedure Laterality Date   MOUTH SURGERY  2009 or 2010   Family History:  Family History  Problem Relation Age of Onset   Mental illness Father    Asthma Father    PDD Brother    Seizures Brother    Family Psychiatric  History:  Social History:  Social History   Substance and Sexual Activity  Alcohol Use No   Alcohol/week: 1.0 standard drink of alcohol   Types: 1 Standard drinks or equivalent per week   Comment: denies at this time     Social History   Substance and Sexual Activity  Drug Use No   Comment: History of cocaine use at age 56 for 4 months    Social  History   Socioeconomic History   Marital status: Widowed    Spouse name: Not on file   Number of children: 0   Years of education: Not on file   Highest education level: Not on file  Occupational History   Occupation: disability  Tobacco Use   Smoking status: Former    Types: Cigarettes   Smokeless tobacco: Never   Tobacco comments:    Smoked for 2  years age [age]  Vaping Use   Vaping status:  Never Used  Substance and Sexual Activity   Alcohol use: No    Alcohol/week: 1.0 standard drink of alcohol    Types: 1 Standard drinks or equivalent per week    Comment: denies at this time   Drug use: No    Comment: History of cocaine use at age 25 for 4 months   Sexual activity: Not Currently    Birth control/protection: None  Other Topics Concern   Not on file  Social History Narrative   Marital status: Widowed      Children: daughter      Lives: with boyfriend, in two story home      Employment:  Disability      Tobacco: quit smoking; smoked for two years.      Alcohol ;none      Drugs: none   Has not traveled outside of the country.   Right handed         Patient with hx of Fibromyalgia,Orthostatic Tachycardia,Asthma,Arthritis,Gerd, ASD   Social Determinants of Health   Financial Resource Strain: Not on file  Food Insecurity: No Food Insecurity (10/19/2022)   Hunger Vital Sign    Worried About Running Out of Food in the Last Year: Never true    Ran Out of Food in the Last Year: Never true  Transportation Needs: No Transportation Needs (10/19/2022)   PRAPARE - Administrator, Civil Service (Medical): No    Lack of Transportation (Non-Medical): No  Physical Activity: Not on file  Stress: Not on file  Social Connections: Not on file   Additional Social History:    Pain Medications: Pt denies abuse Prescriptions: Pt denies abuse Over the Counter: Pt denies abuse History of alcohol / drug use?: No history of alcohol / drug abuse Longest period of sobriety (when/how long): NA Negative Consequences of Use:  (NA) Withdrawal Symptoms: None                    Sleep: Fair  Appetite:  Fair  Current Medications: Current Facility-Administered Medications  Medication Dose Route Frequency Provider Last Rate Last Admin   albuterol (VENTOLIN HFA) 108 (90 Base) MCG/ACT inhaler 2 puff  2 puff Inhalation Q6H PRN Ajibola, Ene A, NP       alum & mag  hydroxide-simeth (MAALOX/MYLANTA) 200-200-20 MG/5ML suspension 30 mL  30 mL Oral Q4H PRN Ajibola, Ene A, NP       ARIPiprazole (ABILIFY) tablet 2 mg  2 mg Oral Daily Ajibola, Ene A, NP       cyclobenzaprine (FLEXERIL) tablet 10 mg  10 mg Oral QHS Ajibola, Ene A, NP   10 mg at 12/14/22 2235   diclofenac Sodium (VOLTAREN) 1 % topical gel 2 g  2 g Topical QID PRN Ajibola, Ene A, NP   2 g at 12/14/22 2235   DULoxetine (CYMBALTA) DR capsule 60 mg  60 mg Oral Daily Ajibola, Ene A, NP       gabapentin (NEURONTIN) capsule 600  mg  600 mg Oral TID Ajibola, Ene A, NP   600 mg at 12/14/22 2306   hydrOXYzine (ATARAX) tablet 25 mg  25 mg Oral TID PRN Ajibola, Ene A, NP       ibuprofen (ADVIL) tablet 600 mg  600 mg Oral TID PRN Ajibola, Ene A, NP   600 mg at 12/14/22 2235   magnesium hydroxide (MILK OF MAGNESIA) suspension 30 mL  30 mL Oral Daily PRN Ajibola, Ene A, NP       mometasone-formoterol (DULERA) 200-5 MCG/ACT inhaler 2 puff  2 puff Inhalation BID Ajibola, Ene A, NP   2 puff at 12/14/22 2235   OLANZapine (ZYPREXA) tablet 15 mg  15 mg Oral QHS Ajibola, Ene A, NP   15 mg at 12/14/22 2234   OLANZapine (ZYPREXA) tablet 5 mg  5 mg Oral q12n4p Ajibola, Ene A, NP       pantoprazole (PROTONIX) EC tablet 40 mg  40 mg Oral QAC breakfast Ajibola, Ene A, NP   40 mg at 12/15/22 0817   prazosin (MINIPRESS) capsule 1 mg  1 mg Oral QHS Ajibola, Ene A, NP   1 mg at 12/14/22 2234   propafenone (RYTHMOL SR) 12 hr capsule 225 mg  225 mg Oral BID Ajibola, Ene A, NP   225 mg at 12/14/22 2351   traZODone (DESYREL) tablet 50 mg  50 mg Oral QHS PRN Ajibola, Ene A, NP       Current Outpatient Medications  Medication Sig Dispense Refill   albuterol (VENTOLIN HFA) 108 (90 Base) MCG/ACT inhaler Inhale 2 puffs into the lungs every 6 (six) hours as needed for wheezing or shortness of breath. 1 each 0   ARIPiprazole (ABILIFY) 2 MG tablet Take 1 tablet (2 mg total) by mouth daily for 7 days. 7 tablet 0   cyclobenzaprine (FLEXERIL) 10  MG tablet Take 10 mg by mouth at bedtime.     diclofenac Sodium (VOLTAREN) 1 % GEL Apply 2 g topically 4 (four) times daily. (Patient taking differently: Apply 2 g topically in the morning, at noon, and at bedtime.)     DULoxetine (CYMBALTA) 60 MG capsule Take 1 capsule (60 mg total) by mouth daily for 7 days. 7 capsule 0   ferrous sulfate 325 (65 FE) MG tablet Take 1 tablet (325 mg total) by mouth 2 (two) times daily with a meal. (Patient not taking: Reported on 12/05/2022)     gabapentin (NEURONTIN) 600 MG tablet Take 600 mg by mouth 3 (three) times daily.     hydrOXYzine (ATARAX) 25 MG tablet Take 25 mg by mouth 3 (three) times daily.     ibuprofen (ADVIL) 600 MG tablet Take 1 tablet (600 mg total) by mouth 3 (three) times daily as needed for mild pain or moderate pain.     metoprolol tartrate (LOPRESSOR) 25 MG tablet Take 1 tablet (25 mg total) by mouth every 12 (twelve) hours for 7 days. 14 tablet 0   mometasone-formoterol (DULERA) 200-5 MCG/ACT AERO Inhale 2 puffs into the lungs 2 (two) times daily. 1 each 0   OLANZapine (ZYPREXA) 15 MG tablet Take 15 mg by mouth at bedtime.     OLANZapine (ZYPREXA) 5 MG tablet Take 3 tablets (15 mg total) by mouth at bedtime for 7 days. (Patient taking differently: Take 5 mg by mouth in the morning and at bedtime. Take 1 tablet at 1200 and 1700) 21 tablet 0   pantoprazole (PROTONIX) 40 MG tablet Take 1 tablet (40 mg total)  by mouth daily before breakfast for 7 days. 7 tablet 0   polyethylene glycol (MIRALAX / GLYCOLAX) 17 g packet Take 17 g by mouth 2 (two) times daily as needed for mild constipation. (Patient not taking: Reported on 12/05/2022)     prazosin (MINIPRESS) 1 MG capsule Take 1 capsule (1 mg total) by mouth at bedtime for 7 days. 7 capsule 0   propafenone (RYTHMOL SR) 225 MG 12 hr capsule Take 225 mg by mouth 2 (two) times daily.     senna-docusate (SENOKOT-S) 8.6-50 MG tablet Take 1 tablet by mouth 2 (two) times daily as needed for moderate  constipation. (Patient not taking: Reported on 12/05/2022)      Lab Results:  Results for orders placed or performed during the hospital encounter of 12/14/22 (from the past 48 hour(s))  CBC with Differential/Platelet     Status: Abnormal   Collection Time: 12/14/22  9:44 PM  Result Value Ref Range   WBC 8.6 4.0 - 10.5 K/uL   RBC 4.58 3.87 - 5.11 MIL/uL   Hemoglobin 11.9 (L) 12.0 - 15.0 g/dL   HCT 66.4 40.3 - 47.4 %   MCV 82.3 80.0 - 100.0 fL   MCH 26.0 26.0 - 34.0 pg   MCHC 31.6 30.0 - 36.0 g/dL   RDW 25.9 56.3 - 87.5 %   Platelets 367 150 - 400 K/uL   nRBC 0.0 0.0 - 0.2 %   Neutrophils Relative % 66 %   Neutro Abs 5.6 1.7 - 7.7 K/uL   Lymphocytes Relative 26 %   Lymphs Abs 2.2 0.7 - 4.0 K/uL   Monocytes Relative 6 %   Monocytes Absolute 0.6 0.1 - 1.0 K/uL   Eosinophils Relative 1 %   Eosinophils Absolute 0.1 0.0 - 0.5 K/uL   Basophils Relative 1 %   Basophils Absolute 0.0 0.0 - 0.1 K/uL   Immature Granulocytes 0 %   Abs Immature Granulocytes 0.03 0.00 - 0.07 K/uL    Comment: Performed at [facility], [address], [place], [place] [address]  Comprehensive metabolic panel     Status: Abnormal   Collection Time: 12/14/22  9:44 PM  Result Value Ref Range   Sodium 140 135 - 145 mmol/L   Potassium 3.9 3.5 - 5.1 mmol/L   Chloride 106 98 - 111 mmol/L   CO2 24 22 - 32 mmol/L   Glucose, Bld 82 70 - 99 mg/dL    Comment: Glucose reference range applies only to samples taken after fasting for at least 8 hours.   BUN <5 (L) 6 - 20 mg/dL   Creatinine, Ser 9.51 0.44 - 1.00 mg/dL   Calcium 9.2 8.9 - 88.4 mg/dL   Total Protein 6.6 6.5 - 8.1 g/dL   Albumin 3.7 3.5 - 5.0 g/dL   AST 19 15 - 41 U/L   ALT 19 0 - 44 U/L   Alkaline Phosphatase 89 38 - 126 U/L   Total Bilirubin 0.4 <1.2 mg/dL   GFR, Estimated >16 >60 mL/min    Comment: (NOTE) Calculated using the CKD-EPI Creatinine Equation (2021)    Anion gap 10 5 - 15    Comment: Performed at [facility], [address] [place], [place] [address]  Ethanol     Status: None   Collection Time: 12/14/22  9:44 PM  Result Value Ref Range   Alcohol, Ethyl (B) <10 <10 mg/dL    Comment: (NOTE) Lowest detectable limit for serum alcohol is 10 mg/dL.  For medical purposes only. Performed at [facility], [address], [place], [place] [address]   Lipid panel     Status: Abnormal   Collection Time: 12/14/22  9:44 PM  Result Value Ref Range   Cholesterol 188 0 - 200 mg/dL   Triglycerides 604 (H) <150 mg/dL   HDL 41 >54 mg/dL   Total CHOL/HDL Ratio 4.6 RATIO   VLDL 44 (H) 0 - 40 mg/dL   LDL Cholesterol 098 (H) 0 - 99 mg/dL    Comment:        Total Cholesterol/HDL:CHD Risk Coronary Heart Disease Risk Table                     Men   Women  1/2 Average Risk   3.4   3.3  Average Risk       5.0   4.4  2 X Average Risk   9.6   7.1  3 X Average Risk  23.4   11.0        Use the calculated Patient Ratio above and the CHD Risk Table to determine the patient's CHD Risk.        ATP III CLASSIFICATION (LDL):  <100     mg/dL   Optimal  119-147  mg/dL   Near or Above                    Optimal  130-159  mg/dL   Borderline  829-562  mg/dL   High  >130     mg/dL   Very High Performed at [facility], [address], [place], [place] [address]   TSH     Status: None   Collection Time: 12/14/22  9:44 PM  Result Value Ref Range   TSH 2.483 0.350 - 4.500 uIU/mL    Comment: Performed by a 3rd Generation assay with a functional sensitivity of <=0.01 uIU/mL. Performed at [facility], [address] [place], [place] [address]   POCT Urine Drug Screen - (I-Screen)     Status: Normal   Collection Time: 12/14/22 10:50 PM  Result Value Ref Range   POC Amphetamine UR None Detected NONE DETECTED (Cut Off Level 1000 ng/mL)   POC Secobarbital (BAR) None Detected NONE DETECTED (Cut Off Level 300 ng/mL)   POC Buprenorphine (BUP) None Detected NONE DETECTED (Cut Off Level 10 ng/mL)   POC Oxazepam (BZO)  None Detected NONE DETECTED (Cut Off Level 300 ng/mL)   POC Cocaine UR None Detected NONE DETECTED (Cut Off Level 300 ng/mL)   POC Methamphetamine UR None Detected NONE DETECTED (Cut Off Level 1000 ng/mL)   POC Morphine None Detected NONE DETECTED (Cut Off Level 300 ng/mL)   POC Methadone UR None Detected NONE DETECTED (Cut Off Level 300 ng/mL)   POC Oxycodone UR None Detected NONE DETECTED (Cut Off Level 100 ng/mL)   POC Marijuana UR None Detected NONE DETECTED (Cut Off Level 50 ng/mL)  POC urine preg, ED     Status: None   Collection Time: 12/14/22 11:04 PM  Result Value Ref Range   Preg Test, Ur Negative Negative    Blood Alcohol level:  Lab Results  Component Value Date   [facility] <10 12/14/2022   ETH <10 12/05/2022    Metabolic Disorder Labs: Lab Results  Component Value Date   HGBA1C 5.4 10/22/2022   MPG 108 10/22/2022   MPG 103 04/20/2022   Lab Results  Component Value Date  PROLACTIN 66.2 (H) 03/02/2021   PROLACTIN 66.1 (H) 02/04/2020   Lab Results  Component Value Date   CHOL 188 12/14/2022   TRIG 222 (H) 12/14/2022   HDL 41 12/14/2022   CHOLHDL 4.6 12/14/2022   VLDL 44 (H) 12/14/2022   LDLCALC 103 (H) 12/14/2022   LDLCALC 117 (H) 10/22/2022    Physical Findings: AIMS:  , ,  ,  ,    CIWA:    COWS:     Musculoskeletal: Strength & Muscle Tone: within normal limits Gait & Station: normal Patient leans: N/A  Psychiatric Specialty Exam:  Presentation  General Appearance:  Appropriate for Environment  Eye Contact: Good  Speech: Clear and Coherent  Speech Volume: Normal  Handedness: Right   Mood and Affect  Mood: Anxious; Depressed  Affect: Congruent   Thought Process  Thought Processes: Coherent  Descriptions of Associations:Intact  Orientation:Full (Time, Place and Person)  Thought Content:Logical  History of Schizophrenia/Schizoaffective disorder:No  Duration of Psychotic Symptoms:N/A  Hallucinations:Hallucinations:  None  Ideas of Reference:None  Suicidal Thoughts:Suicidal Thoughts: Yes, Passive SI Active Intent and/or Plan: With Plan; With Intent  Homicidal Thoughts:Homicidal Thoughts: No   Sensorium  Memory: Immediate Good; Remote Good; Recent Good  Judgment: Poor  Insight: Lacking   Executive Functions  Concentration: Poor  Attention Span: Good  Recall: Fair  Fund of Knowledge: Fair  Language: Fair   Psychomotor Activity  Psychomotor Activity:Psychomotor Activity: Normal   Assets  Assets: Desire for Improvement; Social Support   Sleep  Sleep: Sleep: Fair Number of Hours of Sleep: 5    Physical Exam: Physical Exam Vitals and nursing note reviewed.  Constitutional:      Appearance: Normal appearance.  Cardiovascular:     Rate and Rhythm: Normal rate and regular rhythm.  Neurological:     Mental Status: She is alert and oriented to person, place, and time.  Psychiatric:        Mood and Affect: Mood normal.        Behavior: Behavior normal.    Review of Systems  Psychiatric/Behavioral:  Positive for depression and suicidal ideas. The patient is nervous/anxious.   All other systems reviewed and are negative.  Blood pressure 119/76, pulse (!) 135, temperature 97.8 F (36.6 C), temperature source Oral, resp. rate 18, SpO2 99%. There is no height or weight on file to calculate BMI.   Treatment Plan Summary: Daily contact with patient to assess and evaluate symptoms and progress in treatment and Medication management  Continue seeking inpatient admission CSW to follow-up with patient's current ACT team for additional outpatient resources and follow-up  Oneta Rack, NP 12/15/2022, 10:13 AM

## 2022-12-15 NOTE — ED Notes (Signed)
Patient observed resting quietly, eyes closed. Respirations equal and unlabored. Will continue to monitor for safety.  

## 2022-12-15 NOTE — ED Notes (Signed)
Pt refused vitals 

## 2022-12-15 NOTE — ED Notes (Signed)
Patient is alert and oriented X 4. She is calm and cooperative. She endorses SI and AVH. States voices are telling her to kill herself. She denies HI. Patient endorse bilateral knee pain. Volterian gel provided. Patient unable to contract for safety. We will to continue to monitor closely.

## 2022-12-15 NOTE — ED Notes (Signed)
Patient resting quietly in bed with eyes closed, Respirations equal and unlabored, skin warm and dry.  Routine safety checks conducted according to facility protocol. Will continue to monitor for safety.

## 2022-12-15 NOTE — ED Notes (Signed)
Patient banging on the windows, stating she wants to go. Providers have been made aware.

## 2022-12-16 DIAGNOSIS — F250 Schizoaffective disorder, bipolar type: Secondary | ICD-10-CM | POA: Diagnosis not present

## 2022-12-16 DIAGNOSIS — F840 Autistic disorder: Secondary | ICD-10-CM

## 2022-12-16 DIAGNOSIS — R45851 Suicidal ideations: Secondary | ICD-10-CM | POA: Diagnosis not present

## 2022-12-16 DIAGNOSIS — F603 Borderline personality disorder: Secondary | ICD-10-CM | POA: Diagnosis not present

## 2022-12-16 MED ORDER — LORAZEPAM 2 MG/ML IJ SOLN: 2.0000 mg | Freq: Four times a day (QID) | INTRAMUSCULAR | Status: DC | PRN

## 2022-12-16 NOTE — Discharge Instructions (Addendum)
Discharge recommendations:   Medications: Patient is to take medications as prescribed. No medication changes were made during your visit. The patient or patient's guardian is to contact a medical professional and/or outpatient provider to address any new side effects that develop. The patient or the patient's guardian should update outpatient providers of any new medications and/or medication changes.   Outpatient Follow up: Outpatient Follow up: Please follow up with the [facility] for medication management and community support. You are scheduled for a home visit on Monday, 12/17/22 around 11:30 am.  Address: [address], [place], [place] [address] Phone: [phone] After hour phone: [phone]  Therapy: We recommend that patient participate in individual therapy to address mental health concerns.  Atypical antipsychotics: If you are prescribed an atypical antipsychotic, it is recommended that your height, weight, BMI, blood pressure, fasting lipid panel, and fasting blood sugar be monitored by your outpatient providers.  Safety:   The following safety precautions should be taken:   No sharp objects. This includes scissors, razors, scrapers, and putty knives.   Chemicals should be removed and locked up.   Medications should be removed and locked up.   Weapons should be removed and locked up. This includes firearms, knives and instruments that can be used to cause injury.   The patient should abstain from use of illicit substances/drugs and abuse of any medications.  If symptoms worsen or do not continue to improve or if the patient becomes actively suicidal or homicidal then it is recommended that the patient return to the closest hospital emergency department, the [facility], or call 911 for further evaluation and treatment. National Suicide Prevention Lifeline 1-800-SUICIDE or 820-631-8477.  About 988 988 offers 24/7 access  to trained crisis counselors who can help people experiencing mental health-related distress. People can call or text 988 or chat 988lifeline.org for themselves or if they are worried about a loved one who may need crisis support.

## 2022-12-16 NOTE — ED Provider Notes (Signed)
FBC/OBS ASAP Discharge Summary  Date and Time: 12/16/2022 10:16 AM  Name: [name]  MRN:  [id number]   Discharge Diagnoses:  Final diagnoses:  Schizoaffective disorder, bipolar type (HCC)  Borderline personality disorder in adult [facility])  Autism spectrum disorder  Suicidal ideation    Subjective: Patient seen and evaluated face-to-face by this provider, chart reviewed and case discussed with Dr. Enedina Finner. On evaluation, patient is alert and oriented x 4. Her thought process is linear and speech is clear and coherent. Her mood is anxious and affect is congruent. Patient states that she is ready to go and that she brought herself in voluntarily. She states that she is no longer suicidal after getting back on her medications. She denies active SI, HI, AVH. There is no objective evidence that the patient is currently responding to internal or external stimuli. She states that she is established with the [facility] team and they will come out to her home either today or tomorrow for a visit. She denies depressive symptoms. She reports fair sleep. She reports a fair appetite. She denies physical complaints. She identifies protective factors as wanting to live for her 97-year-old daughter and states that her next visit is with her daughter is on November 30th. She states that she is also studying for her coding exam. She denies access to firearms.   Patient seen by Dr. Enedina Finner with this provider present. Patient denies SI and verbally contracts for safety. She states that if she begins having suicidal thoughts she can come here ([id number]) or call the mobile crisis hotline. She gives verbal permission for this provider to speak with Fayrene Fearing, a friend whom she is currently living with and to speak with the [facility] team to schedule a follow-up appointment post discharge.   This provider spoke to Fayrene Fearing [phone] via telephone to safety plan. Fayrene Fearing, confirms that the patient does  not have access to firearms. He states that the patient still manages her own medication and that he was told that she would no longer manage her medications when she got out of the hospital. I discussed with Fayrene Fearing locking up the patient's medication and assisting her with medication management until we can speak with the [facility] to come up with a plan to manage the patient's home medications considering her history of several suicidal attempts by overdose. Noelia, spoke to [place] via telephone and is agreeable to the stated medication plan.   I spoke to Ananias Pilgrim via telephone [phone] with the [facility]. Patient scheduled for a follow up home visit on 12/17/22 at 11:30 am. I also discussed the need for the ACT Team to develop a plan of care to help manage the patient's home medications consider her extensive history of overdoses. Peyton Najjar, states that he will discuss this matter on Monday morning during their team meeting.   Stay Summary: [name] is a 32 year old female with psychiatric history significant for borderline personality disorder, PTSD, schizoaffective disorder bipolar type, depression, and autism spectrum disorder who [facility] to the [facility] on 12/14/22 by law enforcement with chief complaint of worsening depressive symptoms, auditory hallucination, and suicidal ideation.   Total Time spent with patient: 30 minutes  Past Psychiatric History: psychiatric history significant for borderline personality disorder, PTSD, schizoaffective disorder bipolar type, depression, and autism spectrum disorder. Per chart, patient overdosed on 12/05/22 and was admitted to inpatient psych at [facility]. Per patient, she was discharged from  [facility] on 12/11/22.   Past Medical History: History Fibromyalgia, seizures, asthma and migraines.   Family Psychiatric History: Father history of mental illness  Social History: Patient lives with a roommate  Fayrene Fearing. Patient denies using drugs or alcohol. Patient has a 35 year old who lives with her (P) grandmother.   Tobacco Cessation:  N/A, patient does not currently use tobacco products  Current Medications:  Current Facility-Administered Medications  Medication Dose Route Frequency Provider Last Rate Last Admin   albuterol (VENTOLIN HFA) 108 (90 Base) MCG/ACT inhaler 2 puff  2 puff Inhalation Q6H PRN Ajibola, Ene A, NP       alum & mag hydroxide-simeth (MAALOX/MYLANTA) 200-200-20 MG/5ML suspension 30 mL  30 mL Oral Q4H PRN Ajibola, Ene A, NP       ARIPiprazole (ABILIFY) tablet 2 mg  2 mg Oral Daily Ajibola, Ene A, NP   2 mg at 12/16/22 0911   cyclobenzaprine (FLEXERIL) tablet 10 mg  10 mg Oral QHS Ajibola, Ene A, NP   10 mg at 12/15/22 2122   diclofenac Sodium (VOLTAREN) 1 % topical gel 2 g  2 g Topical QID PRN Ajibola, Ene A, NP   2 g at 12/15/22 1034   DULoxetine (CYMBALTA) DR capsule 60 mg  60 mg Oral Daily Ajibola, Ene A, NP   60 mg at 12/16/22 0912   gabapentin (NEURONTIN) capsule 600 mg  600 mg Oral TID Ajibola, Ene A, NP   600 mg at 12/16/22 0913   hydrOXYzine (ATARAX) tablet 25 mg  25 mg Oral TID PRN Ajibola, Ene A, NP   25 mg at 12/16/22 0655   ibuprofen (ADVIL) tablet 600 mg  600 mg Oral TID PRN Ajibola, Ene A, NP   600 mg at 12/14/22 2235   LORazepam (ATIVAN) injection 2 mg  2 mg Intramuscular Q6H PRN Delvecchio Madole L, NP       magnesium hydroxide (MILK OF MAGNESIA) suspension 30 mL  30 mL Oral Daily PRN Ajibola, Ene A, NP       mometasone-formoterol (DULERA) 200-5 MCG/ACT inhaler 2 puff  2 puff Inhalation BID Ajibola, Ene A, NP   2 puff at 12/16/22 0913   OLANZapine (ZYPREXA) tablet 15 mg  15 mg Oral QHS Ajibola, Ene A, NP   15 mg at 12/15/22 2122   OLANZapine (ZYPREXA) tablet 5 mg  5 mg Oral q12n4p Ajibola, Ene A, NP   5 mg at 12/15/22 1651   pantoprazole (PROTONIX) EC tablet 40 mg  40 mg Oral QAC breakfast Ajibola, Ene A, NP   40 mg at 12/16/22 0655   prazosin (MINIPRESS) capsule 1  mg  1 mg Oral QHS Ajibola, Ene A, NP   1 mg at 12/15/22 2122   propafenone (RYTHMOL SR) 12 hr capsule 225 mg  225 mg Oral BID Ajibola, Ene A, NP   225 mg at 12/16/22 0913   traZODone (DESYREL) tablet 50 mg  50 mg Oral QHS PRN Ajibola, Ene A, NP       Current Outpatient Medications  Medication Sig Dispense Refill   albuterol (VENTOLIN HFA) 108 (90 Base) MCG/ACT inhaler Inhale 2 puffs into the lungs every 6 (six) hours as needed for wheezing or shortness of breath. 1 each 0   ARIPiprazole (ABILIFY) 2 MG tablet Take 1 tablet (2 mg total) by mouth daily for 7 days. 7 tablet 0   cyclobenzaprine (FLEXERIL) 10 MG tablet Take 10 mg by mouth at bedtime.     diclofenac Sodium (VOLTAREN)  1 % GEL Apply 2 g topically 4 (four) times daily. (Patient taking differently: Apply 2 g topically in the morning, at noon, and at bedtime.)     DULoxetine (CYMBALTA) 60 MG capsule Take 1 capsule (60 mg total) by mouth daily for 7 days. 7 capsule 0   ferrous sulfate 325 (65 FE) MG tablet Take 1 tablet (325 mg total) by mouth 2 (two) times daily with a meal. (Patient not taking: Reported on 12/05/2022)     gabapentin (NEURONTIN) 600 MG tablet Take 600 mg by mouth 3 (three) times daily.     hydrOXYzine (ATARAX) 25 MG tablet Take 25 mg by mouth 3 (three) times daily.     ibuprofen (ADVIL) 600 MG tablet Take 1 tablet (600 mg total) by mouth 3 (three) times daily as needed for mild pain or moderate pain.     metoprolol tartrate (LOPRESSOR) 25 MG tablet Take 1 tablet (25 mg total) by mouth every 12 (twelve) hours for 7 days. 14 tablet 0   mometasone-formoterol (DULERA) 200-5 MCG/ACT AERO Inhale 2 puffs into the lungs 2 (two) times daily. 1 each 0   OLANZapine (ZYPREXA) 15 MG tablet Take 15 mg by mouth at bedtime.     OLANZapine (ZYPREXA) 5 MG tablet Take 3 tablets (15 mg total) by mouth at bedtime for 7 days. (Patient taking differently: Take 5 mg by mouth in the morning and at bedtime. Take 1 tablet at 1200 and 1700) 21 tablet 0    pantoprazole (PROTONIX) 40 MG tablet Take 1 tablet (40 mg total) by mouth daily before breakfast for 7 days. 7 tablet 0   polyethylene glycol (MIRALAX / GLYCOLAX) 17 g packet Take 17 g by mouth 2 (two) times daily as needed for mild constipation. (Patient not taking: Reported on 12/05/2022)     prazosin (MINIPRESS) 1 MG capsule Take 1 capsule (1 mg total) by mouth at bedtime for 7 days. 7 capsule 0   propafenone (RYTHMOL SR) 225 MG 12 hr capsule Take 225 mg by mouth 2 (two) times daily.     senna-docusate (SENOKOT-S) 8.6-50 MG tablet Take 1 tablet by mouth 2 (two) times daily as needed for moderate constipation. (Patient not taking: Reported on 12/05/2022)      PTA Medications:  Facility Ordered Medications  Medication   alum & mag hydroxide-simeth (MAALOX/MYLANTA) 200-200-20 MG/5ML suspension 30 mL   magnesium hydroxide (MILK OF MAGNESIA) suspension 30 mL   hydrOXYzine (ATARAX) tablet 25 mg   traZODone (DESYREL) tablet 50 mg   albuterol (VENTOLIN HFA) 108 (90 Base) MCG/ACT inhaler 2 puff   ARIPiprazole (ABILIFY) tablet 2 mg   cyclobenzaprine (FLEXERIL) tablet 10 mg   DULoxetine (CYMBALTA) DR capsule 60 mg   mometasone-formoterol (DULERA) 200-5 MCG/ACT inhaler 2 puff   OLANZapine (ZYPREXA) tablet 15 mg   pantoprazole (PROTONIX) EC tablet 40 mg   prazosin (MINIPRESS) capsule 1 mg   propafenone (RYTHMOL SR) 12 hr capsule 225 mg   OLANZapine (ZYPREXA) tablet 5 mg   ibuprofen (ADVIL) tablet 600 mg   diclofenac Sodium (VOLTAREN) 1 % topical gel 2 g   gabapentin (NEURONTIN) capsule 600 mg   LORazepam (ATIVAN) injection 2 mg   PTA Medications  Medication Sig   ibuprofen (ADVIL) 600 MG tablet Take 1 tablet (600 mg total) by mouth 3 (three) times daily as needed for mild pain or moderate pain.   diclofenac Sodium (VOLTAREN) 1 % GEL Apply 2 g topically 4 (four) times daily. (Patient taking differently: Apply 2 g  topically in the morning, at noon, and at bedtime.)   ferrous sulfate 325 (65  FE) MG tablet Take 1 tablet (325 mg total) by mouth 2 (two) times daily with a meal. (Patient not taking: Reported on 12/05/2022)   senna-docusate (SENOKOT-S) 8.6-50 MG tablet Take 1 tablet by mouth 2 (two) times daily as needed for moderate constipation. (Patient not taking: Reported on 12/05/2022)   metoprolol tartrate (LOPRESSOR) 25 MG tablet Take 1 tablet (25 mg total) by mouth every 12 (twelve) hours for 7 days.   prazosin (MINIPRESS) 1 MG capsule Take 1 capsule (1 mg total) by mouth at bedtime for 7 days.   ARIPiprazole (ABILIFY) 2 MG tablet Take 1 tablet (2 mg total) by mouth daily for 7 days.   DULoxetine (CYMBALTA) 60 MG capsule Take 1 capsule (60 mg total) by mouth daily for 7 days.   OLANZapine (ZYPREXA) 5 MG tablet Take 3 tablets (15 mg total) by mouth at bedtime for 7 days. (Patient taking differently: Take 5 mg by mouth in the morning and at bedtime. Take 1 tablet at 1200 and 1700)   polyethylene glycol (MIRALAX / GLYCOLAX) 17 g packet Take 17 g by mouth 2 (two) times daily as needed for mild constipation. (Patient not taking: Reported on 12/05/2022)   pantoprazole (PROTONIX) 40 MG tablet Take 1 tablet (40 mg total) by mouth daily before breakfast for 7 days.   albuterol (VENTOLIN HFA) 108 (90 Base) MCG/ACT inhaler Inhale 2 puffs into the lungs every 6 (six) hours as needed for wheezing or shortness of breath.   mometasone-formoterol (DULERA) 200-5 MCG/ACT AERO Inhale 2 puffs into the lungs 2 (two) times daily.   gabapentin (NEURONTIN) 600 MG tablet Take 600 mg by mouth 3 (three) times daily.   OLANZapine (ZYPREXA) 15 MG tablet Take 15 mg by mouth at bedtime.   cyclobenzaprine (FLEXERIL) 10 MG tablet Take 10 mg by mouth at bedtime.   hydrOXYzine (ATARAX) 25 MG tablet Take 25 mg by mouth 3 (three) times daily.   propafenone (RYTHMOL SR) 225 MG 12 hr capsule Take 225 mg by mouth 2 (two) times daily.       03/10/2022   10:01 PM 03/10/2022    9:56 PM 02/10/2022    1:49 AM  Depression  screen PHQ 2/9  Decreased Interest 1 1 2   Down, Depressed, Hopeless 1 1 2   PHQ - 2 Score 2 2 4   Altered sleeping 1 1 2   Tired, decreased energy 1 1 2   Change in appetite 1 0 2  Feeling bad or failure about yourself  1 1 2   Trouble concentrating 1 1 2   Moving slowly or fidgety/restless 1 1 1   Suicidal thoughts 1 1 1   PHQ-9 Score 9 8 16   Difficult doing work/chores Somewhat difficult  Very difficult    Flowsheet Row ED from 12/14/2022 in [facility] ED from 12/05/2022 in [facility] Emergency Department at [facility] ED from 11/30/2022 in [facility] Emergency Department at [facility]  C-SSRS RISK CATEGORY Error: Question 2 not populated High Risk No Risk       Musculoskeletal  Strength & Muscle Tone: within normal limits Gait & Station: normal Patient leans: N/A  Psychiatric Specialty Exam  Presentation  General Appearance:  Appropriate for Environment  Eye Contact: Fair  Speech: Clear and Coherent  Speech Volume: Normal  Handedness: Right   Mood and Affect  Mood: Anxious  Affect: Congruent   Thought Process  Thought Processes:  Coherent  Descriptions of Associations:Intact  Orientation:Full (Time, Place and Person)  Thought Content:Logical  Diagnosis of Schizophrenia or Schizoaffective disorder in past: No    Hallucinations:Hallucinations: None  Ideas of Reference:None  Suicidal Thoughts:Suicidal Thoughts: No  Homicidal Thoughts:Homicidal Thoughts: No   Sensorium  Memory: Immediate Fair; Recent Fair; Remote Fair  Judgment: Intact  Insight: Present   Executive Functions  Concentration: Fair  Attention Span: Fair  Recall: [employer] of Knowledge: Fair  Language: Fair   Psychomotor Activity  Psychomotor Activity: Psychomotor Activity: Normal   Assets  Assets: Communication Skills; Desire for Improvement; Housing; Social Support; Physical Health; Leisure Time   Sleep   Sleep: Sleep: Fair Number of Hours of Sleep: 5   Physical Exam  Physical Exam HENT:     Head: Normocephalic.     Nose: Nose normal.  Cardiovascular:     Rate and Rhythm: Normal rate.  Pulmonary:     Effort: Pulmonary effort is normal.  Musculoskeletal:        General: Normal range of motion.     Cervical back: Normal range of motion.  Neurological:     Mental Status: She is alert and oriented to person, place, and time.    Review of Systems  Constitutional: Negative.   HENT: Negative.    Eyes: Negative.   Respiratory: Negative.    Cardiovascular: Negative.   Gastrointestinal: Negative.   Genitourinary: Negative.   Musculoskeletal: Negative.   Neurological: Negative.   Endo/Heme/Allergies: Negative.    Blood pressure (!) 144/86, pulse (!) 113, temperature 97.7 F (36.5 C), temperature source Oral, resp. rate 18, SpO2 98%. There is no height or weight on file to calculate BMI.  Demographic Factors:  Caucasian, Low socioeconomic status, and Unemployed  Loss Factors: NA  Historical Factors: Impulsivity  Risk Reduction Factors:   Religious beliefs about death, Living with another person, especially a relative, and Positive social support  Continued Clinical Symptoms:  Previous Psychiatric Diagnoses and Treatments  Cognitive Features That Contribute To Risk:  None    Suicide Risk:  Minimal: No identifiable suicidal ideation.  Patients presenting with no risk factors but with morbid ruminations; may be classified as minimal risk based on the severity of the depressive symptoms  Plan Of Care/Follow-up recommendations:   Discharge recommendations:   Medications: Patient is to take medications as prescribed. No medication changes were made during your visit. The patient or patient's guardian is to contact a medical professional and/or outpatient provider to address any new side effects that develop. The patient or the patient's guardian should update outpatient  providers of any new medications and/or medication changes.   Outpatient Follow up: Please follow up with the [facility] for medication management and community support. You are scheduled for a home visit on Monday, 12/17/22 around 11:30 am  Address: [address] [place], [place], [place] [address] Phone: [phone] After hour phone: [phone]  Therapy: We recommend that patient participate in individual therapy to address mental health concerns.  Atypical antipsychotics: If you are prescribed an atypical antipsychotic, it is recommended that your height, weight, BMI, blood pressure, fasting lipid panel, and fasting blood sugar be monitored by your outpatient providers.  Safety:   The following safety precautions should be taken:   No sharp objects. This includes scissors, razors, scrapers, and putty knives.   Chemicals should be removed and locked up.   Medications should be removed and locked up.   Weapons should be removed and locked up. This  includes firearms, knives and instruments that can be used to cause injury.   The patient should abstain from use of illicit substances/drugs and abuse of any medications.  If symptoms worsen or do not continue to improve or if the patient becomes actively suicidal or homicidal then it is recommended that the patient return to the closest hospital emergency department, the [facility], or call 911 for further evaluation and treatment. National Suicide Prevention Lifeline 1-800-SUICIDE or (858) 337-4238.  About 988 988 offers 24/7 access to trained crisis counselors who can help people experiencing mental health-related distress. People can call or text 988 or chat 988lifeline.org for themselves or if they are worried about a loved one who may need crisis support.   Disposition: Discharge home.   Khali Albanese L, NP 12/16/2022, 10:16 AM

## 2022-12-17 ENCOUNTER — Emergency Department (HOSPITAL_COMMUNITY): Payer: Medicare PPO

## 2022-12-17 ENCOUNTER — Encounter (HOSPITAL_COMMUNITY): Payer: Self-pay | Admitting: Emergency Medicine

## 2022-12-17 ENCOUNTER — Emergency Department (HOSPITAL_COMMUNITY)
Admission: EM | Admit: 2022-12-17 | Discharge: 2022-12-17 | Disposition: A | Discharge status: Home / Self Care | Payer: Medicare PPO | Attending: Emergency Medicine | Admitting: Emergency Medicine

## 2022-12-17 ENCOUNTER — Other Ambulatory Visit: Payer: Self-pay

## 2022-12-17 DIAGNOSIS — K429 Umbilical hernia without obstruction or gangrene: Secondary | ICD-10-CM | POA: Insufficient documentation

## 2022-12-17 DIAGNOSIS — R109 Unspecified abdominal pain: Secondary | ICD-10-CM | POA: Diagnosis present

## 2022-12-17 DIAGNOSIS — K760 Fatty (change of) liver, not elsewhere classified: Secondary | ICD-10-CM | POA: Diagnosis not present

## 2022-12-17 DIAGNOSIS — R112 Nausea with vomiting, unspecified: Secondary | ICD-10-CM

## 2022-12-17 DIAGNOSIS — R000 Tachycardia, unspecified: Secondary | ICD-10-CM | POA: Insufficient documentation

## 2022-12-17 LAB — URINALYSIS, ROUTINE W REFLEX MICROSCOPIC
Bacteria, UA: NONE SEEN
Bilirubin Urine: NEGATIVE
Glucose, UA: NEGATIVE mg/dL
Hgb urine dipstick: NEGATIVE
Ketones, ur: NEGATIVE mg/dL
Nitrite: NEGATIVE
Protein, ur: NEGATIVE mg/dL
Specific Gravity, Urine: 1.016 (ref 1.005–1.030)
pH: 5 (ref 5.0–8.0)

## 2022-12-17 LAB — LIPASE, BLOOD: Lipase: 40 U/L (ref 11–51)

## 2022-12-17 LAB — CBC WITH DIFFERENTIAL/PLATELET
Abs Immature Granulocytes: 0.03 10*3/uL (ref 0.00–0.07)
Basophils Absolute: 0 10*3/uL (ref 0.0–0.1)
Basophils Relative: 1 %
Eosinophils Absolute: 0.1 10*3/uL (ref 0.0–0.5)
Eosinophils Relative: 1 %
HCT: 36.4 % (ref 36.0–46.0)
Hemoglobin: 11.2 g/dL — ABNORMAL LOW (ref 12.0–15.0)
Immature Granulocytes: 0 %
Lymphocytes Relative: 33 %
Lymphs Abs: 2.4 10*3/uL (ref 0.7–4.0)
MCH: 26.1 pg (ref 26.0–34.0)
MCHC: 30.8 g/dL (ref 30.0–36.0)
MCV: 84.8 fL (ref 80.0–100.0)
Monocytes Absolute: 0.6 10*3/uL (ref 0.1–1.0)
Monocytes Relative: 8 %
Neutro Abs: 4.2 10*3/uL (ref 1.7–7.7)
Neutrophils Relative %: 57 %
Platelets: 354 10*3/uL (ref 150–400)
RBC: 4.29 MIL/uL (ref 3.87–5.11)
RDW: 13.7 % (ref 11.5–15.5)
WBC: 7.4 10*3/uL (ref 4.0–10.5)
nRBC: 0 % (ref 0.0–0.2)

## 2022-12-17 LAB — COMPREHENSIVE METABOLIC PANEL
ALT: 24 U/L (ref 0–44)
AST: 26 U/L (ref 15–41)
Albumin: 4.1 g/dL (ref 3.5–5.0)
Alkaline Phosphatase: 95 U/L (ref 38–126)
Anion gap: 11 (ref 5–15)
BUN: 6 mg/dL (ref 6–20)
CO2: 23 mmol/L (ref 22–32)
Calcium: 9 mg/dL (ref 8.9–10.3)
Chloride: 105 mmol/L (ref 98–111)
Creatinine, Ser: 0.83 mg/dL (ref 0.44–1.00)
GFR, Estimated: >60 mL/min (ref >60)
Glucose, Bld: 137 mg/dL — ABNORMAL HIGH (ref 70–99)
Potassium: 3.6 mmol/L (ref 3.5–5.1)
Sodium: 139 mmol/L (ref 135–145)
Total Bilirubin: 0.3 mg/dL (ref <1.2)
Total Protein: 7.6 g/dL (ref 6.5–8.1)

## 2022-12-17 LAB — PROLACTIN: Prolactin: 20 ng/mL (ref 4.8–33.4)

## 2022-12-17 LAB — HCG, SERUM, QUALITATIVE: Preg, Serum: NEGATIVE

## 2022-12-17 MED ORDER — ONDANSETRON 4 MG PO TBDP: 4.0000 mg | ORAL_TABLET | Freq: Three times a day (TID) | ORAL | 0 refills | Status: DC | PRN

## 2022-12-17 MED ORDER — ONDANSETRON HCL 4 MG/2ML IJ SOLN
4.0000 mg | Freq: Once | INTRAMUSCULAR | Status: AC
  Administered 2022-12-17: 4 mg via INTRAVENOUS
  Filled 2022-12-17: qty 2

## 2022-12-17 MED ORDER — IOHEXOL 300 MG/ML  SOLN
100.0000 mL | Freq: Once | INTRAMUSCULAR | Status: AC | PRN
  Administered 2022-12-17: 100 mL via INTRAVENOUS

## 2022-12-17 NOTE — ED Triage Notes (Signed)
Pt BIBA presenting with ABD pain x1 week. Pt reports ABD pain began after binge eating on large amount of sugary foods. Pt also endorses nausea/vomiting and diarrhea.  Hx of pancreatitis.

## 2022-12-17 NOTE — ED Notes (Signed)
Shasta clear soda with ice provided to patient for fluid challenge.

## 2022-12-17 NOTE — Discharge Instructions (Addendum)
Your CT scan and blood work looked good.  Take medications as prescribed.  Return for new or worsening symptoms.  The CT scan had 2 incidental findings that you can discuss with your doctor, but nothing that requires immediate treatment. 1. Fatty liver disease.  2. A fatty umbilical hernia.  Please discuss these findings with your doctor at your next routine appointment.

## 2022-12-17 NOTE — ED Provider Notes (Signed)
[facility]-EMERGENCY DEPT [facility] Emergency Department Provider Note MRN:  [id number]  Arrival date & time: 12/17/22     Chief Complaint   Abdominal Pain and Diarrhea   History of Present Illness   [name] is a 32 y.o. year-old female presents to the ED with chief complaint of nausea and vomiting for the past 3 days.  States that she has also had diarrhea.  Denies sick contacts.  States that she has hx of pancreatitis.  States that this feels similar.  States that she hasn't been drinking.  She does still have her gallbladder.  Denies any successful treatments PTA.  History provided by patient.   Review of Systems  Pertinent positive and negative review of systems noted in HPI.    Physical Exam   Vitals:   12/17/22 0134 12/17/22 0309  BP: (!) 144/72 136/81  Pulse: (!) 114 (!) 110  Resp: 18 18  Temp: 98.4 F (36.9 C)   SpO2: 97% 98%    CONSTITUTIONAL:  non toxic-appearing, NAD NEURO:  Alert and oriented x 3, CN 3-12 grossly intact EYES:  eyes equal and reactive ENT/NECK:  Supple, no stridor  CARDIO:  tachycardic, regular rhythm, appears well-perfused  PULM:  No respiratory distress, CTAB GI/GU:  non-distended, epigastric tenderness MSK/SPINE:  No gross deformities, no edema, moves all extremities  SKIN:  no rash, atraumatic   *Additional and/or pertinent findings included in MDM below  Diagnostic and Interventional Summary    EKG Interpretation Date/Time:    Ventricular Rate:    PR Interval:    QRS Duration:    QT Interval:    QTC Calculation:   R Axis:      Text Interpretation:         Labs Reviewed  COMPREHENSIVE METABOLIC PANEL - Abnormal; Notable for the following components:      Result Value   Glucose, Bld 137 (*)    All other components within normal limits  CBC WITH DIFFERENTIAL/PLATELET - Abnormal; Notable for the following components:   Hemoglobin 11.2 (*)    All other components within normal limits  URINALYSIS, ROUTINE W  REFLEX MICROSCOPIC - Abnormal; Notable for the following components:   APPearance HAZY (*)    Leukocytes,Ua TRACE (*)    All other components within normal limits  LIPASE, BLOOD  HCG, SERUM, QUALITATIVE    CT ABDOMEN PELVIS W CONTRAST  Final Result      Medications  ondansetron (ZOFRAN) injection 4 mg (4 mg Intravenous Given 12/17/22 0120)  iohexol (OMNIPAQUE) 300 MG/ML solution 100 mL (100 mLs Intravenous Contrast Given 12/17/22 0157)     Procedures  /  Critical Care Procedures  ED Course and Medical Decision Making  I have reviewed the triage vital signs, the nursing notes, and pertinent available records from the EMR.  Social Determinants Affecting Complexity of Care: Patient has no clinically significant social determinants affecting this chief complaint..   ED Course: Clinical Course as of 12/17/22 0313  [facility] Dec 17, 2022  0311 Comprehensive metabolic panel(!) No significant electrolyte derangement [RB]  0312 hCG, serum, qualitative Not pregnant [RB]  0312 Urinalysis, Routine w reflex microscopic -Urine, Clean Catch(!) Inconsistent with UTI [RB]  0312 Lipase, blood Normal lipase, doubt pancreatitis [RB]  0313 CT ABDOMEN PELVIS W CONTRAST No large volume free fluid or air [RB]    Clinical Course User Index [RB] Roxy Horseman, PA-C    Medical Decision Making Patient here with epigastric abdominal tenderness.  Hx of pancreatitis.  Will check  labs and imaging.    Problems Addressed: Hepatic steatosis: chronic illness or injury Nausea vomiting and diarrhea: acute illness or injury    Details: No emergent cause found on workup.  Tolerating PO.  Rx of zofran sent. Umbilical hernia without obstruction and without gangrene: chronic illness or injury  Amount and/or Complexity of Data Reviewed Labs: ordered. Decision-making details documented in ED Course. Radiology: ordered and independent interpretation performed. Decision-making details documented in ED  Course.  Risk Prescription drug management.         Consultants: No consultations were needed in caring for this patient.   Treatment and Plan: I considered admission due to patient's initial presentation, but after considering the examination and diagnostic results, patient will not require admission and can be discharged with outpatient follow-up.    Final Clinical Impressions(s) / ED Diagnoses     ICD-10-CM   1. Nausea vomiting and diarrhea  R11.2    R19.7     2. Umbilical hernia without obstruction and without gangrene  K42.9     3. Hepatic steatosis  K76.0       ED Discharge Orders          Ordered    ondansetron (ZOFRAN-ODT) 4 MG disintegrating tablet  Every 8 hours PRN        12/17/22 0304              Discharge Instructions Discussed with and Provided to Patient:     Discharge Instructions      Your CT scan and blood work looked good.  Take medications as prescribed.  Return for new or worsening symptoms.  The CT scan had 2 incidental findings that you can discuss with your doctor, but nothing that requires immediate treatment. 1. Fatty liver disease.  2. A fatty umbilical hernia.  Please discuss these findings with your doctor at your next routine appointment.        Roxy Horseman, PA-C 12/17/22 8295    Nira Conn, MD 12/17/22 [id number]

## 2022-12-18 ENCOUNTER — Emergency Department (HOSPITAL_COMMUNITY)
Admission: EM | Admit: 2022-12-18 | Discharge: 2022-12-19 | Disposition: A | Discharge status: Other Acute Inpatient Hospital | Payer: Medicare PPO | Attending: Emergency Medicine | Admitting: Emergency Medicine

## 2022-12-18 DIAGNOSIS — R45851 Suicidal ideations: Secondary | ICD-10-CM | POA: Insufficient documentation

## 2022-12-18 DIAGNOSIS — F603 Borderline personality disorder: Secondary | ICD-10-CM | POA: Insufficient documentation

## 2022-12-18 DIAGNOSIS — X838XXA Intentional self-harm by other specified means, initial encounter: Secondary | ICD-10-CM | POA: Insufficient documentation

## 2022-12-18 DIAGNOSIS — J45909 Unspecified asthma, uncomplicated: Secondary | ICD-10-CM | POA: Diagnosis not present

## 2022-12-18 DIAGNOSIS — F840 Autistic disorder: Secondary | ICD-10-CM | POA: Insufficient documentation

## 2022-12-18 DIAGNOSIS — Z7289 Other problems related to lifestyle: Secondary | ICD-10-CM

## 2022-12-18 DIAGNOSIS — R4588 Nonsuicidal self-harm: Secondary | ICD-10-CM | POA: Insufficient documentation

## 2022-12-18 DIAGNOSIS — Z87891 Personal history of nicotine dependence: Secondary | ICD-10-CM | POA: Insufficient documentation

## 2022-12-18 NOTE — ED Triage Notes (Signed)
Pt BIB EMS , for self harm  from home, having SI x24 hours, pt had a stressful day, didn't want to take her life but wanted to relief pain and cut herself with scissors on all 4 extremity , all superficial per EMS, forearms wrapped, denies hallucinations, and denies ETOH or drugs per EMS. Tetanus UTD w/in last 5 years, pt states " Its up to date, I do this often,"

## 2022-12-19 ENCOUNTER — Inpatient Hospital Stay (HOSPITAL_COMMUNITY)
Admission: AD | Admit: 2022-12-19 | Discharge: 2023-01-15 | DRG: 885 | Disposition: A | Discharge status: Home / Self Care | Payer: Medicare PPO | Source: Intra-hospital | Attending: Psychiatry | Admitting: Psychiatry

## 2022-12-19 ENCOUNTER — Other Ambulatory Visit: Payer: Self-pay

## 2022-12-19 ENCOUNTER — Encounter (HOSPITAL_COMMUNITY): Payer: Self-pay | Admitting: Psychiatry

## 2022-12-19 DIAGNOSIS — R45851 Suicidal ideations: Secondary | ICD-10-CM | POA: Diagnosis present

## 2022-12-19 DIAGNOSIS — F251 Schizoaffective disorder, depressive type: Secondary | ICD-10-CM | POA: Diagnosis present

## 2022-12-19 DIAGNOSIS — Z79899 Other long term (current) drug therapy: Secondary | ICD-10-CM | POA: Diagnosis not present

## 2022-12-19 DIAGNOSIS — F845 Asperger's syndrome: Secondary | ICD-10-CM | POA: Diagnosis present

## 2022-12-19 DIAGNOSIS — G8929 Other chronic pain: Secondary | ICD-10-CM | POA: Diagnosis present

## 2022-12-19 DIAGNOSIS — Z91030 Bee allergy status: Secondary | ICD-10-CM | POA: Diagnosis not present

## 2022-12-19 DIAGNOSIS — Z87891 Personal history of nicotine dependence: Secondary | ICD-10-CM | POA: Diagnosis not present

## 2022-12-19 DIAGNOSIS — F603 Borderline personality disorder: Secondary | ICD-10-CM | POA: Diagnosis present

## 2022-12-19 DIAGNOSIS — Z825 Family history of asthma and other chronic lower respiratory diseases: Secondary | ICD-10-CM | POA: Diagnosis not present

## 2022-12-19 DIAGNOSIS — G4700 Insomnia, unspecified: Secondary | ICD-10-CM | POA: Diagnosis present

## 2022-12-19 DIAGNOSIS — Z91013 Allergy to seafood: Secondary | ICD-10-CM | POA: Diagnosis not present

## 2022-12-19 DIAGNOSIS — Z91410 Personal history of adult physical and sexual abuse: Secondary | ICD-10-CM

## 2022-12-19 DIAGNOSIS — Z888 Allergy status to other drugs, medicaments and biological substances status: Secondary | ICD-10-CM | POA: Diagnosis not present

## 2022-12-19 DIAGNOSIS — Z7289 Other problems related to lifestyle: Secondary | ICD-10-CM

## 2022-12-19 DIAGNOSIS — Z148 Genetic carrier of other disease: Secondary | ICD-10-CM | POA: Diagnosis not present

## 2022-12-19 DIAGNOSIS — Z62812 Personal history of neglect in childhood: Secondary | ICD-10-CM

## 2022-12-19 DIAGNOSIS — W449XXA Unspecified foreign body entering into or through a natural orifice, initial encounter: Secondary | ICD-10-CM | POA: Diagnosis present

## 2022-12-19 DIAGNOSIS — Z1152 Encounter for screening for COVID-19: Secondary | ICD-10-CM | POA: Diagnosis not present

## 2022-12-19 DIAGNOSIS — Z9151 Personal history of suicidal behavior: Secondary | ICD-10-CM

## 2022-12-19 DIAGNOSIS — Z91048 Other nonmedicinal substance allergy status: Secondary | ICD-10-CM

## 2022-12-19 DIAGNOSIS — Y929 Unspecified place or not applicable: Secondary | ICD-10-CM

## 2022-12-19 DIAGNOSIS — Z7951 Long term (current) use of inhaled steroids: Secondary | ICD-10-CM | POA: Diagnosis not present

## 2022-12-19 DIAGNOSIS — Z9152 Personal history of nonsuicidal self-harm: Secondary | ICD-10-CM

## 2022-12-19 DIAGNOSIS — Z6841 Body Mass Index (BMI) 40.0 and over, adult: Secondary | ICD-10-CM | POA: Diagnosis not present

## 2022-12-19 DIAGNOSIS — R9431 Abnormal electrocardiogram [ECG] [EKG]: Secondary | ICD-10-CM | POA: Insufficient documentation

## 2022-12-19 DIAGNOSIS — E669 Obesity, unspecified: Secondary | ICD-10-CM | POA: Diagnosis present

## 2022-12-19 DIAGNOSIS — F250 Schizoaffective disorder, bipolar type: Principal | ICD-10-CM | POA: Diagnosis present

## 2022-12-19 DIAGNOSIS — K219 Gastro-esophageal reflux disease without esophagitis: Secondary | ICD-10-CM | POA: Diagnosis present

## 2022-12-19 DIAGNOSIS — M797 Fibromyalgia: Secondary | ICD-10-CM | POA: Diagnosis present

## 2022-12-19 DIAGNOSIS — J45909 Unspecified asthma, uncomplicated: Secondary | ICD-10-CM | POA: Diagnosis present

## 2022-12-19 DIAGNOSIS — F4310 Post-traumatic stress disorder, unspecified: Secondary | ICD-10-CM | POA: Diagnosis present

## 2022-12-19 DIAGNOSIS — T189XXA Foreign body of alimentary tract, part unspecified, initial encounter: Principal | ICD-10-CM | POA: Diagnosis present

## 2022-12-19 DIAGNOSIS — Z818 Family history of other mental and behavioral disorders: Secondary | ICD-10-CM

## 2022-12-19 DIAGNOSIS — Z882 Allergy status to sulfonamides status: Secondary | ICD-10-CM | POA: Diagnosis not present

## 2022-12-19 DIAGNOSIS — F411 Generalized anxiety disorder: Secondary | ICD-10-CM | POA: Diagnosis present

## 2022-12-19 HISTORY — DX: Abnormal electrocardiogram (ECG) (EKG): R94.31

## 2022-12-19 LAB — CBC WITH DIFFERENTIAL/PLATELET
Abs Immature Granulocytes: 0.03 10*3/uL (ref 0.00–0.07)
Basophils Absolute: 0 10*3/uL (ref 0.0–0.1)
Basophils Relative: 1 %
Eosinophils Absolute: 0.1 10*3/uL (ref 0.0–0.5)
Eosinophils Relative: 1 %
HCT: 34.3 % — ABNORMAL LOW (ref 36.0–46.0)
Hemoglobin: 11.1 g/dL — ABNORMAL LOW (ref 12.0–15.0)
Immature Granulocytes: 0 %
Lymphocytes Relative: 30 %
Lymphs Abs: 2.5 10*3/uL (ref 0.7–4.0)
MCH: 27.1 pg (ref 26.0–34.0)
MCHC: 32.4 g/dL (ref 30.0–36.0)
MCV: 83.7 fL (ref 80.0–100.0)
Monocytes Absolute: 0.6 10*3/uL (ref 0.1–1.0)
Monocytes Relative: 7 %
Neutro Abs: 5 10*3/uL (ref 1.7–7.7)
Neutrophils Relative %: 61 %
Platelets: 312 10*3/uL (ref 150–400)
RBC: 4.1 MIL/uL (ref 3.87–5.11)
RDW: 13.4 % (ref 11.5–15.5)
WBC: 8.3 10*3/uL (ref 4.0–10.5)
nRBC: 0 % (ref 0.0–0.2)

## 2022-12-19 LAB — COMPREHENSIVE METABOLIC PANEL
ALT: 24 U/L (ref 0–44)
AST: 26 U/L (ref 15–41)
Albumin: 3.5 g/dL (ref 3.5–5.0)
Alkaline Phosphatase: 81 U/L (ref 38–126)
Anion gap: 10 (ref 5–15)
BUN: <5 mg/dL — ABNORMAL LOW (ref 6–20)
CO2: 21 mmol/L — ABNORMAL LOW (ref 22–32)
Calcium: 8.5 mg/dL — ABNORMAL LOW (ref 8.9–10.3)
Chloride: 106 mmol/L (ref 98–111)
Creatinine, Ser: 0.76 mg/dL (ref 0.44–1.00)
GFR, Estimated: >60 mL/min (ref >60)
Glucose, Bld: 115 mg/dL — ABNORMAL HIGH (ref 70–99)
Potassium: 3.6 mmol/L (ref 3.5–5.1)
Sodium: 137 mmol/L (ref 135–145)
Total Bilirubin: 0.3 mg/dL (ref <1.2)
Total Protein: 6.6 g/dL (ref 6.5–8.1)

## 2022-12-19 LAB — RAPID URINE DRUG SCREEN, HOSP PERFORMED
Amphetamines: NOT DETECTED
Barbiturates: NOT DETECTED
Benzodiazepines: NOT DETECTED
Cocaine: NOT DETECTED
Opiates: NOT DETECTED
Tetrahydrocannabinol: NOT DETECTED

## 2022-12-19 LAB — ETHANOL: Alcohol, Ethyl (B): <10 mg/dL (ref <10)

## 2022-12-19 LAB — HCG, SERUM, QUALITATIVE: Preg, Serum: NEGATIVE

## 2022-12-19 MED ORDER — MOMETASONE FURO-FORMOTEROL FUM 200-5 MCG/ACT IN AERO
2.0000 | INHALATION_SPRAY | Freq: Two times a day (BID) | RESPIRATORY_TRACT | Status: DC
Start: 2022-12-20 — End: 2022-12-19
  Filled 2022-12-19 (×2): qty 8.8

## 2022-12-19 MED ORDER — ONDANSETRON 4 MG PO TBDP
4.0000 mg | ORAL_TABLET | Freq: Three times a day (TID) | ORAL | Status: DC | PRN
  Administered 2022-12-21 – 2023-01-14 (×10): 4 mg via ORAL
  Filled 2022-12-19 (×10): qty 1

## 2022-12-19 MED ORDER — ALUM & MAG HYDROXIDE-SIMETH 200-200-20 MG/5ML PO SUSP
30.0000 mL | ORAL | Status: DC | PRN
  Filled 2022-12-19: qty 30

## 2022-12-19 MED ORDER — IBUPROFEN 600 MG PO TABS
600.0000 mg | ORAL_TABLET | Freq: Three times a day (TID) | ORAL | Status: DC | PRN
  Administered 2022-12-19 – 2023-01-15 (×32): 600 mg via ORAL
  Filled 2022-12-19 (×34): qty 1

## 2022-12-19 MED ORDER — HYDROXYZINE HCL 25 MG PO TABS
25.0000 mg | ORAL_TABLET | Freq: Once | ORAL | Status: AC
  Administered 2022-12-19: 25 mg via ORAL
  Filled 2022-12-19: qty 1

## 2022-12-19 MED ORDER — LORAZEPAM 2 MG/ML IJ SOLN
2.0000 mg | Freq: Three times a day (TID) | INTRAMUSCULAR | Status: DC | PRN
  Administered 2022-12-27 – 2023-01-08 (×3): 2 mg via INTRAMUSCULAR
  Filled 2022-12-19 (×3): qty 1

## 2022-12-19 MED ORDER — GABAPENTIN 300 MG PO CAPS
600.0000 mg | ORAL_CAPSULE | Freq: Three times a day (TID) | ORAL | Status: DC
  Administered 2022-12-19 – 2022-12-21 (×6): 600 mg via ORAL
  Filled 2022-12-19 (×12): qty 2

## 2022-12-19 MED ORDER — PROPAFENONE HCL ER 225 MG PO CP12
225.0000 mg | ORAL_CAPSULE | Freq: Two times a day (BID) | ORAL | Status: DC
  Filled 2022-12-19 (×3): qty 1

## 2022-12-19 MED ORDER — HYDROXYZINE HCL 25 MG PO TABS
25.0000 mg | ORAL_TABLET | Freq: Three times a day (TID) | ORAL | Status: DC
  Administered 2022-12-19: 25 mg via ORAL
  Filled 2022-12-19: qty 1

## 2022-12-19 MED ORDER — PROPAFENONE HCL ER 225 MG PO CP12
225.0000 mg | ORAL_CAPSULE | Freq: Two times a day (BID) | ORAL | Status: DC
  Administered 2022-12-20 – 2023-01-03 (×28): 225 mg via ORAL
  Filled 2022-12-19 (×37): qty 1

## 2022-12-19 MED ORDER — PANTOPRAZOLE SODIUM 40 MG PO TBEC
40.0000 mg | DELAYED_RELEASE_TABLET | Freq: Every day | ORAL | Status: DC
  Administered 2022-12-19: 40 mg via ORAL
  Filled 2022-12-19: qty 1

## 2022-12-19 MED ORDER — ALBUTEROL SULFATE HFA 108 (90 BASE) MCG/ACT IN AERS
2.0000 | INHALATION_SPRAY | Freq: Four times a day (QID) | RESPIRATORY_TRACT | Status: DC | PRN
Start: 2022-12-19 — End: 2022-12-19

## 2022-12-19 MED ORDER — GABAPENTIN 300 MG PO CAPS
600.0000 mg | ORAL_CAPSULE | Freq: Three times a day (TID) | ORAL | Status: DC
  Administered 2022-12-19: 600 mg via ORAL
  Filled 2022-12-19: qty 2

## 2022-12-19 MED ORDER — MOMETASONE FURO-FORMOTEROL FUM 200-5 MCG/ACT IN AERO
2.0000 | INHALATION_SPRAY | Freq: Two times a day (BID) | RESPIRATORY_TRACT | Status: DC
  Filled 2022-12-19 (×2): qty 8.8

## 2022-12-19 MED ORDER — IBUPROFEN 200 MG PO TABS: 600.0000 mg | ORAL_TABLET | Freq: Three times a day (TID) | ORAL | Status: DC | PRN

## 2022-12-19 MED ORDER — DULOXETINE HCL 30 MG PO CPEP
60.0000 mg | ORAL_CAPSULE | Freq: Every day | ORAL | Status: DC
  Administered 2022-12-19: 60 mg via ORAL
  Filled 2022-12-19: qty 2

## 2022-12-19 MED ORDER — ALBUTEROL SULFATE (2.5 MG/3ML) 0.083% IN NEBU: 2.5000 mg | INHALATION_SOLUTION | Freq: Four times a day (QID) | RESPIRATORY_TRACT | Status: DC | PRN

## 2022-12-19 MED ORDER — HYDROXYZINE HCL 25 MG PO TABS
25.0000 mg | ORAL_TABLET | Freq: Three times a day (TID) | ORAL | Status: DC
  Administered 2022-12-19 – 2022-12-24 (×16): 25 mg via ORAL
  Filled 2022-12-19 (×24): qty 1

## 2022-12-19 MED ORDER — PRAZOSIN HCL 1 MG PO CAPS: 1.0000 mg | ORAL_CAPSULE | Freq: Every day | ORAL | Status: DC

## 2022-12-19 MED ORDER — MOMETASONE FURO-FORMOTEROL FUM 200-5 MCG/ACT IN AERO
2.0000 | INHALATION_SPRAY | Freq: Two times a day (BID) | RESPIRATORY_TRACT | Status: DC
Start: 2022-12-20 — End: 2023-01-15
  Administered 2022-12-19 – 2023-01-15 (×53): 2 via RESPIRATORY_TRACT
  Filled 2022-12-19 (×3): qty 8.8

## 2022-12-19 MED ORDER — OLANZAPINE 7.5 MG PO TABS
15.0000 mg | ORAL_TABLET | Freq: Every day | ORAL | Status: DC
  Administered 2022-12-19 – 2022-12-21 (×3): 15 mg via ORAL
  Filled 2022-12-19 (×5): qty 2
  Filled 2022-12-19: qty 6

## 2022-12-19 MED ORDER — DIPHENHYDRAMINE HCL 50 MG/ML IJ SOLN
50.0000 mg | Freq: Three times a day (TID) | INTRAMUSCULAR | Status: DC | PRN
  Administered 2022-12-27 – 2023-01-08 (×3): 50 mg via INTRAMUSCULAR
  Filled 2022-12-19 (×3): qty 1

## 2022-12-19 MED ORDER — LORAZEPAM 1 MG PO TABS
2.0000 mg | ORAL_TABLET | Freq: Three times a day (TID) | ORAL | Status: DC | PRN
  Administered 2022-12-22 – 2023-01-11 (×11): 2 mg via ORAL
  Filled 2022-12-19 (×11): qty 2

## 2022-12-19 MED ORDER — ARIPIPRAZOLE 2 MG PO TABS
2.0000 mg | ORAL_TABLET | Freq: Every day | ORAL | Status: DC
  Administered 2022-12-19: 2 mg via ORAL
  Filled 2022-12-19 (×2): qty 1

## 2022-12-19 MED ORDER — DULOXETINE HCL 60 MG PO CPEP
60.0000 mg | ORAL_CAPSULE | Freq: Every day | ORAL | Status: DC
  Administered 2022-12-20 – 2022-12-27 (×8): 60 mg via ORAL
  Filled 2022-12-19 (×10): qty 1

## 2022-12-19 MED ORDER — PRAZOSIN HCL 1 MG PO CAPS
1.0000 mg | ORAL_CAPSULE | Freq: Every day | ORAL | Status: DC
Start: 2022-12-19 — End: 2023-01-15
  Administered 2022-12-19 – 2023-01-14 (×27): 1 mg via ORAL
  Filled 2022-12-19: qty 2
  Filled 2022-12-19: qty 1
  Filled 2022-12-19: qty 2
  Filled 2022-12-19 (×4): qty 1
  Filled 2022-12-19 (×2): qty 2
  Filled 2022-12-19: qty 1
  Filled 2022-12-19: qty 2
  Filled 2022-12-19 (×2): qty 1
  Filled 2022-12-19: qty 2
  Filled 2022-12-19 (×7): qty 1
  Filled 2022-12-19: qty 2
  Filled 2022-12-19: qty 1
  Filled 2022-12-19 (×2): qty 2
  Filled 2022-12-19 (×2): qty 1
  Filled 2022-12-19: qty 2
  Filled 2022-12-19: qty 1
  Filled 2022-12-19: qty 2
  Filled 2022-12-19 (×4): qty 1

## 2022-12-19 MED ORDER — BACITRACIN ZINC 500 UNIT/GM EX OINT: TOPICAL_OINTMENT | Freq: Once | CUTANEOUS | Status: AC

## 2022-12-19 MED ORDER — ONDANSETRON 4 MG PO TBDP: 4.0000 mg | ORAL_TABLET | Freq: Three times a day (TID) | ORAL | Status: DC | PRN

## 2022-12-19 MED ORDER — OLANZAPINE 5 MG PO TABS: 15.0000 mg | ORAL_TABLET | Freq: Every day | ORAL | Status: DC

## 2022-12-19 MED ORDER — PANTOPRAZOLE SODIUM 40 MG PO TBEC
40.0000 mg | DELAYED_RELEASE_TABLET | Freq: Every day | ORAL | Status: DC
  Administered 2022-12-20 – 2023-01-15 (×27): 40 mg via ORAL
  Filled 2022-12-19: qty 1
  Filled 2022-12-19: qty 2
  Filled 2022-12-19 (×5): qty 1
  Filled 2022-12-19 (×3): qty 2
  Filled 2022-12-19: qty 1
  Filled 2022-12-19: qty 2
  Filled 2022-12-19 (×2): qty 1
  Filled 2022-12-19: qty 2
  Filled 2022-12-19 (×2): qty 1
  Filled 2022-12-19: qty 2
  Filled 2022-12-19 (×2): qty 1
  Filled 2022-12-19 (×2): qty 2
  Filled 2022-12-19 (×4): qty 1
  Filled 2022-12-19 (×2): qty 2
  Filled 2022-12-19 (×5): qty 1

## 2022-12-19 MED ORDER — DIPHENHYDRAMINE HCL 25 MG PO CAPS
50.0000 mg | ORAL_CAPSULE | Freq: Three times a day (TID) | ORAL | Status: DC | PRN
  Administered 2022-12-22 – 2023-01-11 (×10): 50 mg via ORAL
  Filled 2022-12-19 (×11): qty 2

## 2022-12-19 MED ORDER — MAGNESIUM HYDROXIDE 400 MG/5ML PO SUSP
30.0000 mL | Freq: Every day | ORAL | Status: DC | PRN
  Administered 2022-12-21 – 2023-01-05 (×3): 30 mL via ORAL
  Filled 2022-12-19 (×3): qty 30

## 2022-12-19 MED ORDER — ARIPIPRAZOLE 2 MG PO TABS
2.0000 mg | ORAL_TABLET | Freq: Every day | ORAL | Status: DC
  Administered 2022-12-20: 2 mg via ORAL
  Filled 2022-12-19 (×2): qty 1

## 2022-12-19 MED ORDER — BACITRACIN ZINC 500 UNIT/GM EX OINT
TOPICAL_OINTMENT | CUTANEOUS | Status: AC
  Administered 2022-12-19: 1 via TOPICAL
  Filled 2022-12-19: qty 2.7

## 2022-12-19 NOTE — Plan of Care (Signed)
?  Problem: Education: ?Goal: Knowledge of [facility] Education information/materials will improve ?Outcome: Not Progressing ?Goal: Emotional status will improve ?Outcome: Not Progressing ?Goal: Mental status will improve ?Outcome: Not Progressing ?Goal: Verbalization of understanding the information provided will improve ?Outcome: Not Progressing ?  ?Problem: Activity: ?Goal: Interest or engagement in activities will improve ?Outcome: Not Progressing ?Goal: Sleeping patterns will improve ?Outcome: Not Progressing ?  ?Problem: Coping: ?Goal: Ability to verbalize frustrations and anger appropriately will improve ?Outcome: Not Progressing ?Goal: Ability to demonstrate self-control will improve ?Outcome: Not Progressing ?  ?Problem: Health Behavior/Discharge Planning: ?Goal: Identification of resources available to assist in meeting health care needs will improve ?Outcome: Not Progressing ?Goal: Compliance with treatment plan for underlying cause of condition will improve ?Outcome: Not Progressing ?  ?Problem: Physical Regulation: ?Goal: Ability to maintain clinical measurements within normal limits will improve ?Outcome: Not Progressing ?  ?Problem: Safety: ?Goal: Periods of time without injury will increase ?Outcome: Not Progressing ?  ?

## 2022-12-19 NOTE — ED Notes (Signed)
Pt transferred to the unit around 5 am this morning and is still asleep and has not been awake to take meds

## 2022-12-19 NOTE — [facility] Group Notes (Signed)
[facility] Group Notes:  (Nursing/MHT/Case Management/Adjunct)  Date:  12/19/2022  Time:  2000 Type of Therapy:  Wrap up group  Participation Level:  Active  Participation Quality:  Appropriate, Attentive, and Sharing  Affect:  Depressed  Cognitive:  Alert  Insight:  Limited  Engagement in Group:  Engaged  Modes of Intervention:  Clarification, Education, and Support  Summary of Progress/Problems: Positive thinking and self-care were discussed.   Johann Capers S 12/19/2022, 9:07 PM

## 2022-12-19 NOTE — Progress Notes (Signed)
Pt has been accepted to [facility] on 12/19/2022 Bed assignment: 305-1  Pt meets inpatient criteria per: Phebe Colla NP  Attending Physician will be: Dr. Sarita Bottom, MD    Report can be called to:Adult unit: [phone]  Pt can arrive after DC   Care Team Notified: [facility] AC:  Rona Ravens RN, Lorella Nimrod RN, Phebe Colla RN, Cathlyn Parsons RN  [place] Shayana Hornstein LCSW-A   12/19/2022 12:59 PM

## 2022-12-19 NOTE — ED Provider Notes (Addendum)
[place] EMERGENCY DEPARTMENT AT [facility] Provider Note   CSN: [id number] Arrival date & time: 12/18/22  2252     History  Chief Complaint  Patient presents with   Suicidal    [name] is a 32 y.o. female.  Patient presents to the emergency department via EMS due to self-harm and suicidal ideations for approximate 24 hours.  Patient states that she is having issues with custody of her child along with memories of the passing of her significant other which occurred around this time of year 7 years ago.  The patient cut herself with scissors on all 4 extremities with superficial wounds.  Bleeding controlled prior to arrival.  The patient endorses having up-to-date tetanus as she did this approximately 6 months ago most recently.  She does not endorse a specific plan when I discussed this with her.  She denies auditory or visual hallucinations, denies homicidal ideations.  Past medical history significant for borderline personality disorder, schizoaffective disorder, PTSD, seizures, generalized anxiety disorder, suicide attempts  HPI     Home Medications Prior to Admission medications   Medication Sig Start Date End Date Taking? Authorizing Provider  albuterol (VENTOLIN HFA) 108 (90 Base) MCG/ACT inhaler Inhale 2 puffs into the lungs every 6 (six) hours as needed for wheezing or shortness of breath. 10/31/22   Massengill, Harrold Donath, MD  ARIPiprazole (ABILIFY) 2 MG tablet Take 1 tablet (2 mg total) by mouth daily for 7 days. 10/31/22 12/05/23  Massengill, Harrold Donath, MD  diclofenac Sodium (VOLTAREN) 1 % GEL Apply 2 g topically 4 (four) times daily. Patient taking differently: Apply 2 g topically in the morning, at noon, and at bedtime. 10/19/22   Osvaldo Shipper, MD  DULoxetine (CYMBALTA) 60 MG capsule Take 1 capsule (60 mg total) by mouth daily for 7 days. 11/01/22 12/05/23  Massengill, Harrold Donath, MD  gabapentin (NEURONTIN) 600 MG tablet Take 600 mg by mouth 3 (three) times  daily. 12/04/22   [provider]  hydrOXYzine (ATARAX) 25 MG tablet Take 25 mg by mouth 3 (three) times daily.    [provider]  mometasone-formoterol (DULERA) 200-5 MCG/ACT AERO Inhale 2 puffs into the lungs 2 (two) times daily. 10/31/22   Massengill, Harrold Donath, MD  OLANZapine (ZYPREXA) 15 MG tablet Take 15 mg by mouth at bedtime.    [provider]  OLANZapine (ZYPREXA) 5 MG tablet Take 3 tablets (15 mg total) by mouth at bedtime for 7 days. Patient taking differently: Take 5 mg by mouth in the morning and at bedtime. Take 1 tablet at 1200 and 1700 10/31/22 12/05/23  Massengill, Harrold Donath, MD  ondansetron (ZOFRAN-ODT) 4 MG disintegrating tablet Take 1 tablet (4 mg total) by mouth every 8 (eight) hours as needed for nausea or vomiting. 12/17/22   Roxy Horseman, PA-C  pantoprazole (PROTONIX) 40 MG tablet Take 1 tablet (40 mg total) by mouth daily before breakfast for 7 days. 10/31/22 12/05/23  Massengill, Harrold Donath, MD  prazosin (MINIPRESS) 1 MG capsule Take 1 capsule (1 mg total) by mouth at bedtime for 7 days. 10/31/22 12/05/23  Massengill, Harrold Donath, MD  propafenone (RYTHMOL SR) 225 MG 12 hr capsule Take 225 mg by mouth 2 (two) times daily.    [provider]      Allergies    Bee venom, Coconut flavor, Fish allergy, Geodon [ziprasidone hcl], Haloperidol and related, Lithobid [lithium], Roxicodone [oxycodone], Seroquel [quetiapine], Shellfish allergy, Phenergan [promethazine hcl], Prilosec [omeprazole], Sulfa antibiotics, Tegretol [carbamazepine], Prozac [fluoxetine], Tape, and Tylenol [acetaminophen]  Review of Systems   Review of Systems  Physical Exam Updated Vital Signs BP 133/69   Pulse (!) 120   Temp 98.7 F (37.1 C) (Oral)   Resp 20   SpO2 98%  Physical Exam Vitals and nursing note reviewed.  HENT:     Head: Normocephalic and atraumatic.  Eyes:     Conjunctiva/sclera: Conjunctivae normal.  Cardiovascular:     Rate and Rhythm: Normal rate.   Pulmonary:     Effort: Pulmonary effort is normal. No respiratory distress.  Musculoskeletal:        General: No signs of injury.     Cervical back: Normal range of motion.  Skin:    General: Skin is dry.  Neurological:     Mental Status: She is alert.  Psychiatric:        Speech: Speech normal.        Behavior: Behavior normal.     ED Results / Procedures / Treatments   Labs (all labs ordered are listed, but only abnormal results are displayed) Labs Reviewed  CBC WITH DIFFERENTIAL/PLATELET - Abnormal; Notable for the following components:      Result Value   Hemoglobin 11.1 (*)    HCT 34.3 (*)    All other components within normal limits  COMPREHENSIVE METABOLIC PANEL - Abnormal; Notable for the following components:   CO2 21 (*)    Glucose, Bld 115 (*)    BUN <5 (*)    Calcium 8.5 (*)    All other components within normal limits  ETHANOL  RAPID URINE DRUG SCREEN, HOSP PERFORMED  HCG, SERUM, QUALITATIVE  CBC WITH DIFFERENTIAL/PLATELET    EKG None  Radiology No results found.  Procedures Procedures    Medications Ordered in ED Medications  ibuprofen (ADVIL) tablet 600 mg (has no administration in time range)  prazosin (MINIPRESS) capsule 1 mg (has no administration in time range)  ARIPiprazole (ABILIFY) tablet 2 mg (has no administration in time range)  DULoxetine (CYMBALTA) DR capsule 60 mg (has no administration in time range)  OLANZapine (ZYPREXA) tablet 15 mg (has no administration in time range)  pantoprazole (PROTONIX) EC tablet 40 mg (has no administration in time range)  albuterol (VENTOLIN HFA) 108 (90 Base) MCG/ACT inhaler 2 puff (has no administration in time range)  mometasone-formoterol (DULERA) 200-5 MCG/ACT inhaler 2 puff (has no administration in time range)  gabapentin (NEURONTIN) tablet 600 mg (has no administration in time range)  hydrOXYzine (ATARAX) tablet 25 mg (has no administration in time range)  propafenone (RYTHMOL SR) 12 hr  capsule 225 mg (has no administration in time range)  ondansetron (ZOFRAN-ODT) disintegrating tablet 4 mg (has no administration in time range)  hydrOXYzine (ATARAX) tablet 25 mg (25 mg Oral Given 12/19/22 0153)  bacitracin ointment (1 Application Topical Given 12/19/22 0242)    ED Course/ Medical Decision Making/ A&P                                 Medical Decision Making Risk OTC drugs. Prescription drug management.   This patient presents to the ED for concern of suicidal ideation, this involves an extensive number of treatment options, and is a complaint that carries with it a high risk of complications and morbidity.     Co morbidities that complicate the patient evaluation  History SI, depression, schizoaffective disorder, bipolar disorder   Additional history obtained:  Additional history obtained from EMS External records from  outside source obtained and reviewed including notes from October 30.  Patient was going [facility] for inpatient [facility] treatment due to a suicide attempt by overdose   Lab Tests:  I Ordered, and personally interpreted labs.  The pertinent results include: Grossly unremarkable CMP, CBC, negative pregnancy test   Cardiac Monitoring: / EKG:  The patient was maintained on a cardiac monitor.  I personally viewed and interpreted the cardiac monitored which showed an underlying rhythm of: Sinus tachycardia   Problem List / ED Course / Critical interventions / Medication management   I ordered medication including Atarax for anxiety, bacitracin ointment for wound care Reevaluation of the patient after these medicines showed that the patient improved I have reviewed the patients home medicines and have made adjustments as needed   Test / Admission - Considered:  Patient medically clear at this time for TTS evaluation.  Patient is here voluntarily and I do not see the need for involuntary commitment as patient endorses  intent to stay and wishes to receive help. Home medications and diet ordered.          Final Clinical Impression(s) / ED Diagnoses Final diagnoses:  Suicidal ideation  Deliberate self-cutting    Rx / DC Orders ED Discharge Orders     None      Addendum: Telepsych provider recommending inpatient treatment   Pamala Duffel 12/19/22 0432    Darrick Grinder, PA-C 12/19/22 0631    Nira Conn, MD 12/19/22 [phone]

## 2022-12-19 NOTE — BH Assessment (Signed)
TTS consult has been deferred to IRIS. IRIS coordinator will reach out in established secure chat with provider name and assessment time.

## 2022-12-19 NOTE — Consult Note (Signed)
[name] Telepsychiatry Consult Note  Patient Name: [name] MRN: [id number] DOB: October 01, 1990 DATE OF Consult: 12/19/2022  PRIMARY PSYCHIATRIC DIAGNOSES  1.  Borderline personality disorder    RECOMMENDATIONS  Recommendations: Medication recommendations: Continue Abilify 2mg  po daily for mood; continue duloxetine 60mg  po daily for mood; continue gabapentin 600mg  po tID for anxiety; continue hydroxyzine 25mg  po TID anxiety; continue olanzapine 15mg  po nightly; continue prazosin; 1mg  po nightly; recommend olanzapine 5mg  po TID PRN agitation Non-Medication/therapeutic recommendations: Psychiatric hospitalization  Is inpatient psychiatric hospitalization recommended for this patient? Yes (Explain why): Suicidal ideation with intent and plan, no access to means, unable to engage in safety planning  Follow-Up Telepsychiatry C/L services: We will sign off for now. Please re-consult our service if needed for any concerning changes in the patient's condition, discharge planning, or questions. Communication: Treatment team members (and family members if applicable) who were involved in treatment/care discussions and planning, and with whom we spoke or engaged with via secure text/chat, include the following: Barrie Dunker, PA  Thank you for involving [name] in the care of this patient. If you have any additional questions or concerns, please call [phone] and ask for me or the provider on-call.  TELEPSYCHIATRY ATTESTATION & CONSENT  As the provider for this telehealth consult, I attest that I verified the patient's identity using two separate identifiers, introduced myself to the patient, provided my credentials, disclosed my location, and performed this encounter via a HIPAA-compliant, real-time, face-to-face, two-way, interactive audio and video platform and with the full consent and agreement of the patient (or guardian as applicable.)  Patient physical location: ED in [facility]   Telehealth provider physical location: home office in state of [place]   Video start time: 0613 AM EST Video end time: 0625 AM EST   IDENTIFYING DATA  [name] is a 32 y.o. year-old female for whom a psychiatric consultation has been ordered by the primary provider. The patient was identified using two separate identifiers.  CHIEF COMPLAINT/REASON FOR CONSULT  Suicidal ideation and non-suicidal self injury  HISTORY OF PRESENT ILLNESS (HPI)  [name] is a 32 year old female with a history of borderline personality disorder, autism spectrum disorder, schizoaffective disorder, anxiety, insomnia, chronic pain, fibromyalgia who presents to the ED with suicidal ideation and non-suicidal self-injury. Chart reviewed. Patient with frequent ED presentations for non-suicidal self injury and suicidal ideation, most recently on 11/8. Psychiatry consulted for disposition recommendations.  On evaluation, patient noted to be irritable, somewhat cooperative, with poor eye contact, fidgeting in bed, linear, not appearing internally preoccupied, not responding to internal stimuli, alert and oriented x 4. Patient reports she came to the ED due to having suicidal thoughts. And states she engaged in non-suicidal self injury. Reports she misses her daughter who is not in her custody. Reports her daughter is in the custody of daughter's paternal grandmother. States she would like to have custody of her daughter but cannot currently. Patient also reports feeling upset because she got into an argument with her father yesterday and the day before. States, "I feel like he not there for me enough and is not being a good day". She states, "We had some texts that weren't so pretty." Patient endorses suicidal ideation with thoughts to overdose on pills. Reports she does not have access to her medication. States her roommate keeps her medication for her and dispenses them. Patient reports she has attempted suicide  over 60 times. States most recently was the end of October when  she overdosed on medication. Patient endorses chronic suicidal ideation, states it is typically passive and when stressed becomes active. Patient reports she thinks she needs to be psychiatrically hospitalized. States the hospital helps with "lots." Patient endorses depressed mood, insomnia, fatigue, hopelessness, worthlessness, poor concentration. Denies auditory and visual hallucinations, paranoia, homicidal ideation. Patient reports she takes her psychiatric medication as prescribed. Patient unable to engage in safety planning at the time of evaluation.    PAST PSYCHIATRIC HISTORY  Outpatient: ACT team  Inpatient: Multiple prior psychiatric hospitalizations  Non-suicidal self injury: Cutting  Suicide attempts: Multiple suicide attempts by overdose - she states about 60 times Violence: Denies Drugs/alcohol: Denies Trauma/Abuse/Neglect: Yes Otherwise as per HPI above.  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 fragile X syndrome    Chronic constipation    Depression    Drug-seeking behavior    Essential tremor    Headache    Ineffective individual coping 05/16/2022   Insomnia 01/12/2022   Intentional drug overdose (HCC) 06/05/2022   Neuromuscular disorder (HCC)    Normocytic anemia 06/05/2022   Overdose 07/22/2017   Overdose of acetaminophen 07/2017   and other meds   Overdose, intentional self-harm, initial encounter (HCC) 07/20/2021   Paranoia (HCC) 04/22/2021   Personality disorder (HCC)    Purposeful non-suicidal drug ingestion (HCC) 06/27/2021   Schizo-affective psychosis (HCC)    Schizoaffective  disorder (HCC) 07/29/2022   Schizoaffective disorder, bipolar type (HCC)    Seizures (HCC)    Last seizure December 2017   Skin erythema 04/27/2022   Sleep apnea    Suicidal behavior 07/25/2021   Suicidal ideation    Suicide (HCC) 07/01/2021   Suicide attempt (HCC) 07/04/2021     HOME MEDICATIONS  Facility Ordered Medications  Medication   [COMPLETED] hydrOXYzine (ATARAX) tablet 25 mg   [COMPLETED] bacitracin ointment   ibuprofen (ADVIL) tablet 600 mg   prazosin (MINIPRESS) capsule 1 mg   ARIPiprazole (ABILIFY) tablet 2 mg   DULoxetine (CYMBALTA) DR capsule 60 mg   OLANZapine (ZYPREXA) tablet 15 mg   pantoprazole (PROTONIX) EC tablet 40 mg   mometasone-formoterol (DULERA) 200-5 MCG/ACT inhaler 2 puff   gabapentin (NEURONTIN) capsule 600 mg   hydrOXYzine (ATARAX) tablet 25 mg   propafenone (RYTHMOL SR) 12 hr capsule 225 mg   ondansetron (ZOFRAN-ODT) disintegrating tablet 4 mg   albuterol (PROVENTIL) (2.5 MG/3ML) 0.083% nebulizer solution 2.5 mg   PTA Medications  Medication Sig   diclofenac Sodium (VOLTAREN) 1 % GEL Apply 2 g topically 4 (four) times daily. (Patient taking differently: Apply 2 g topically in the morning, at noon, and at bedtime.)   prazosin (MINIPRESS) 1 MG capsule Take 1 capsule (1 mg total) by mouth at bedtime for 7 days.   ARIPiprazole (ABILIFY) 2 MG tablet Take 1 tablet (2 mg total) by mouth daily for 7 days.   DULoxetine (CYMBALTA) 60 MG capsule Take 1 capsule (60 mg total) by mouth daily for 7 days.   OLANZapine (ZYPREXA) 5 MG tablet Take 3 tablets (15 mg total) by mouth at bedtime for 7 days. (Patient taking differently: Take 5 mg by mouth in the morning  and at bedtime. Take 1 tablet at 1200 and 1700)   pantoprazole (PROTONIX) 40 MG tablet Take 1 tablet (40 mg total) by mouth daily before breakfast for 7 days.   albuterol (VENTOLIN HFA) 108 (90 Base) MCG/ACT inhaler Inhale 2 puffs into the lungs every 6 (six) hours as needed for wheezing or shortness of  breath.   mometasone-formoterol (DULERA) 200-5 MCG/ACT AERO Inhale 2 puffs into the lungs 2 (two) times daily.   gabapentin (NEURONTIN) 600 MG tablet Take 600 mg by mouth 3 (three) times daily.   OLANZapine (ZYPREXA) 15 MG tablet Take 15 mg by mouth at bedtime.   hydrOXYzine (ATARAX) 25 MG tablet Take 25 mg by mouth 3 (three) times daily.   propafenone (RYTHMOL SR) 225 MG 12 hr capsule Take 225 mg by mouth 2 (two) times daily.   ondansetron (ZOFRAN-ODT) 4 MG disintegrating tablet Take 1 tablet (4 mg total) by mouth every 8 (eight) hours as needed for nausea or vomiting.     ALLERGIES  Allergies  Allergen Reactions   Bee Venom Anaphylaxis   Coconut Flavor Anaphylaxis and Rash   Fish Allergy Anaphylaxis   Geodon [Ziprasidone Hcl] Other (See Comments)    Pt states that this medication causes paralysis of the mouth.     Haloperidol And Related Other (See Comments)    Pt states that this medication causes paralysis of the mouth, jaw locks up   Lithobid [Lithium] Other (See Comments)    Seizure-like activity    Roxicodone [Oxycodone] Other (See Comments)    Hallucinations    Seroquel [Quetiapine] Other (See Comments)    Severe drowsiness    Shellfish Allergy Anaphylaxis   Phenergan [Promethazine Hcl] Other (See Comments)    Chest pain     Prilosec [Omeprazole] Nausea And Vomiting and Other (See Comments)    Pt can take protonix with no problems    Sulfa Antibiotics Other (See Comments)    Chest pain    Tegretol [Carbamazepine] Nausea And Vomiting   Prozac [Fluoxetine] Other (See Comments)    Increased Depression and Suicidal thoughts   Tape Other (See Comments)    Skin tears, can only tolerate paper tape.   Tylenol [Acetaminophen] Rash and Other (See Comments)    Rash on face     SOCIAL & SUBSTANCE USE HISTORY  Social History   Socioeconomic History   Marital status: Widowed    Spouse name: Not on file   Number of children: 0   Years of education: Not on file   Highest  education level: Not on file  Occupational History   Occupation: disability  Tobacco Use   Smoking status: Former    Types: Cigarettes   Smokeless tobacco: Never   Tobacco comments:    Smoked for 2  years age [age]  Vaping Use   Vaping status: Never Used  Substance and Sexual Activity   Alcohol use: No    Alcohol/week: 1.0 standard drink of alcohol    Types: 1 Standard drinks or equivalent per week    Comment: denies at this time   Drug use: No    Comment: History of cocaine use at age 71 for 4 months   Sexual activity: Not Currently    Birth control/protection: None  Other Topics Concern   Not on file  Social History Narrative   Marital status: Widowed      Children: daughter      Lives: with boyfriend, in two story home      Employment:  Disability      Tobacco: quit smoking; smoked for two years.      Alcohol ;none      Drugs: none   Has not traveled outside of the country.   Right handed         Patient with hx of Fibromyalgia,Orthostatic Tachycardia,Asthma,Arthritis,Gerd, ASD   Social Determinants of Health   Financial Resource Strain: Not on file  Food Insecurity: No Food Insecurity (10/19/2022)   Hunger Vital Sign    Worried About Running Out of Food in the Last Year: Never true    Ran Out of Food in the Last Year: Never true  Transportation Needs: No Transportation Needs (10/19/2022)   PRAPARE - Administrator, Civil Service (Medical): No    Lack of Transportation (Non-Medical): No  Physical Activity: Not on file  Stress: Not on file  Social Connections: Not on file   Social History   Tobacco Use  Smoking Status Former   Types: Cigarettes  Smokeless Tobacco Never  Tobacco Comments   Smoked for 2  years age [age]   Social History   Substance and Sexual Activity  Alcohol Use No   Alcohol/week: 1.0 standard drink of alcohol   Types: 1 Standard drinks or equivalent per week   Comment: denies at this time   Social History   Substance  and Sexual Activity  Drug Use No   Comment: History of cocaine use at age 67 for 4 months     FAMILY HISTORY  Family History  Problem Relation Age of Onset   Mental illness Father    Asthma Father    PDD Brother    Seizures Brother      MENTAL STATUS EXAM (MSE)  Presentation  General Appearance:  Appropriate for Environment  Eye Contact: Good  Speech: Normal Rate  Speech Volume: Normal  Handedness: Right   Mood and Affect  Mood: Euthymic  Affect: Appropriate   Thought Process  Thought Processes: Coherent  Descriptions of Associations: Intact  Orientation: Full (Time, Place and Person)  Thought Content: Computation  History of Schizophrenia/Schizoaffective disorder: Yes  Duration of Psychotic Symptoms: Greater than six months  Hallucinations: Hallucinations: None  Ideas of Reference: None  Suicidal Thoughts: Suicidal Thoughts: Yes, Passive  Homicidal Thoughts: Homicidal Thoughts: No   Sensorium  Memory: Immediate Good; Recent Good; Remote Good  Judgment: Poor  Insight: Fair   Art therapist  Concentration: Good  Attention Span: Good  Recall: Good  Fund of Knowledge: Good  Language: Good   Psychomotor Activity  Psychomotor Activity: Psychomotor Activity: Normal   Assets  Assets: Communication Skills   Sleep  Sleep:No data recorded  VITALS  Blood pressure 133/69, pulse (!) 120, temperature 98.7 F (37.1 C), temperature source Oral, resp. rate 20, SpO2 98%.  LABS  Admission on 12/18/2022  Component Date Value Ref Range Status   Alcohol, Ethyl (B) 12/18/2022 <10  <10 mg/dL Final   Comment: (NOTE) Lowest detectable limit for serum alcohol is 10 mg/dL.  For medical purposes only. Performed at [facility], [address] [place], [place] [address]    Opiates 12/18/2022 NONE DETECTED  NONE DETECTED Final   Cocaine 12/18/2022 NONE DETECTED  NONE DETECTED Final    Benzodiazepines 12/18/2022 NONE DETECTED  NONE DETECTED Final   Amphetamines 12/18/2022 NONE DETECTED  NONE DETECTED Final   Tetrahydrocannabinol 12/18/2022 NONE DETECTED  NONE DETECTED Final   Barbiturates 12/18/2022 NONE DETECTED  NONE DETECTED Final   Comment: (NOTE) DRUG  SCREEN FOR MEDICAL PURPOSES ONLY.  IF CONFIRMATION IS NEEDED FOR ANY PURPOSE, NOTIFY LAB WITHIN 5 DAYS.  LOWEST DETECTABLE LIMITS FOR URINE DRUG SCREEN Drug Class                     Cutoff (ng/mL) Amphetamine and metabolites    1000 Barbiturate and metabolites    200 Benzodiazepine                 200 Opiates and metabolites        300 Cocaine and metabolites        300 THC                            50 Performed at [facility], [address] [place], [place] [address]    WBC 12/19/2022 8.3  4.0 - 10.5 K/uL Final   RBC 12/19/2022 4.10  3.87 - 5.11 MIL/uL Final   Hemoglobin 12/19/2022 11.1 (L)  12.0 - 15.0 g/dL Final   HCT 59/56/3875 34.3 (L)  36.0 - 46.0 % Final   MCV 12/19/2022 83.7  80.0 - 100.0 fL Final   MCH 12/19/2022 27.1  26.0 - 34.0 pg Final   MCHC 12/19/2022 32.4  30.0 - 36.0 g/dL Final   RDW 64/33/2951 13.4  11.5 - 15.5 % Final   Platelets 12/19/2022 312  150 - 400 K/uL Final   nRBC 12/19/2022 0.0  0.0 - 0.2 % Final   Neutrophils Relative % 12/19/2022 61  % Final   Neutro Abs 12/19/2022 5.0  1.7 - 7.7 K/uL Final   Lymphocytes Relative 12/19/2022 30  % Final   Lymphs Abs 12/19/2022 2.5  0.7 - 4.0 K/uL Final   Monocytes Relative 12/19/2022 7  % Final   Monocytes Absolute 12/19/2022 0.6  0.1 - 1.0 K/uL Final   Eosinophils Relative 12/19/2022 1  % Final   Eosinophils Absolute 12/19/2022 0.1  0.0 - 0.5 K/uL Final   Basophils Relative 12/19/2022 1  % Final   Basophils Absolute 12/19/2022 0.0  0.0 - 0.1 K/uL Final   Immature Granulocytes 12/19/2022 0  % Final   Abs Immature Granulocytes 12/19/2022 0.03  0.00 - 0.07 K/uL Final   Performed at [facility], [address] [place], [place] [address]   Preg, Serum 12/19/2022 NEGATIVE  NEGATIVE Final   Comment:        THE SENSITIVITY OF THIS METHODOLOGY IS >10 mIU/mL. Performed at [facility], [address], [place], [place] [address]    Sodium 12/19/2022 137  135 - 145 mmol/L Final   Potassium 12/19/2022 3.6  3.5 - 5.1 mmol/L Final   Chloride 12/19/2022 106  98 - 111 mmol/L Final   CO2 12/19/2022 21 (L)  22 - 32 mmol/L Final   Glucose, Bld 12/19/2022 115 (H)  70 - 99 mg/dL Final   Glucose reference range applies only to samples taken after fasting for at least 8 hours.   BUN 12/19/2022 <5 (L)  6 - 20 mg/dL Final   Creatinine, Ser 12/19/2022 0.76  0.44 - 1.00 mg/dL Final   Calcium 16/02/930 8.5 (L)  8.9 - 10.3 mg/dL Final   Total Protein 35/57/3220 6.6  6.5 - 8.1 g/dL Final   Albumin 25/42/7062 3.5  3.5 - 5.0 g/dL Final   AST 37/62/8315 26  15 - 41 U/L Final   ALT 12/19/2022 24  0 - 44 U/L Final  Alkaline Phosphatase 12/19/2022 81  38 - 126 U/L Final   Total Bilirubin 12/19/2022 0.3  <1.2 mg/dL Final   GFR, Estimated 12/19/2022 >60  >60 mL/min Final   Comment: (NOTE) Calculated using the CKD-EPI Creatinine Equation (2021)    Anion gap 12/19/2022 10  5 - 15 Final   Performed at [facility], [address] [place], [place] [address]    PSYCHIATRIC REVIEW OF SYSTEMS (ROS)  ROS: Notable for the following relevant positive findings: ROS  Additional findings:      Musculoskeletal: No abnormal movements observed      Gait & Station: Laying/Sitting      Pain Screening: Denies      Nutrition & Dental Concerns: None  RISK FORMULATION/ASSESSMENT  Is the patient experiencing any suicidal or homicidal ideations: Yes       Explain if yes: Endorses suicidal ideation with intent and plan, does not have access to means. Unable to engage in safety planning Protective factors considered for safety management: Social support, engage in outpatient treatment,  daughter  Risk factors/concerns considered for safety management:  Prior attempt Depression Physical illness/chronic pain Hopelessness Impulsivity Unmarried  Is there a Astronomer plan with the patient and treatment team to minimize risk factors and promote protective factors: Yes           Explain: Psychiatric hospitalization  Is crisis care placement or psychiatric hospitalization recommended: Yes     Based on my current evaluation and risk assessment, patient is determined at this time to be at:  High risk  *RISK ASSESSMENT Risk assessment is a dynamic process; it is possible that this patient's condition, and risk level, may change. This should be re-evaluated and managed over time as appropriate. Please re-consult psychiatric consult services if additional assistance is needed in terms of risk assessment and management. If your team decides to discharge this patient, please advise the patient how to best access emergency psychiatric services, or to call 911, if their condition worsens or they feel unsafe in any way.  Zolah Rozak is a 32 year old female with a history of borderline personality disorder, autism spectrum disorder, schizoaffective disorder, anxiety, insomnia, chronic pain, fibromyalgia who presents to the ED with suicidal ideation and non-suicidal self-injury. Chart reviewed. Patient with frequent ED presentations for non-suicidal self injury and suicidal ideation, most recently on 11/8. Psychiatry consulted for disposition recommendations.On evaluation, patient noted to be irritable, somewhat cooperative, with poor eye contact, fidgeting in bed, linear, not appearing internally preoccupied, not responding to internal stimuli, alert and oriented x 4. Patient endorses suicidal ideation with intent and plan, does not have access to means. Patient endorses depressed mood, insomnia, fatigue, hopelessness, worthlessness, poor concentration. Patient unable to engage in safety  planning at the time of evaluation. Patient states she feels she needs psychiatric hospitalization to be safe. The patient's presentation is consistent with borderline personality disorder with poor distress tolerance and limited coping skills. Patient is currently high risk for suicide due to prior attempts, depression, hopelessness, impulsivity, unmarried. Therefore, inpatient psychiatric hospitalization is recommended.    Adria Dill, MD Telepsychiatry Consult Services

## 2022-12-19 NOTE — Progress Notes (Signed)
Pt admitted today voluntarily from [facility]. Pt was at home with her friend and reports her friend called 911 after pt told friend "I'm going to bleed out." And proceeded to cut self. Pt noted to have numerous superficial cuts that cover arms and legs bilaterally. Pt reports triggers include missing her daughter and having an argument with her father. Pt reports she has  ACTT  team since her last discharge from [facility] 10/2022 and has been taking her medication. Pt reports she was having suicidal thoughts at the time of self injurious behavior but denies an attempt. Pt endorses auditory hallucinations and negative thoughts. Pt reports sleep and appetite have been decreased. Pt does contract for safety at this time. Pt denies any ETOH or substance use.

## 2022-12-19 NOTE — ED Notes (Addendum)
Just added to the chat about pt being sent to [facility] today.  Called report and got voluntary papers signed and faxed over.

## 2022-12-19 NOTE — ED Notes (Signed)
Called [facility] for pt transport @ 4pm

## 2022-12-19 NOTE — ED Provider Notes (Signed)
Emergency Medicine Observation Re-evaluation Note  [name] is a 32 y.o. female, seen on rounds today.  Pt initially presented to the ED for complaints of Suicidal Currently, the patient is awaiting inpatient psychiatric care.  Physical Exam  BP (!) 124/58 (BP Location: Left Arm)   Pulse (!) 101   Temp 97.9 F (36.6 C) (Oral)   Resp 20   SpO2 98%  Physical Exam General: NAD Cardiac: RR Lungs: even unlabored Psych: NA  ED Course / MDM  EKG:   I have reviewed the labs performed to date as well as medications administered while in observation.  Recent changes in the last 24 hours include arrival to ed.  Plan  Current plan is for inpatient psychiatric care. Telepsych has seen her and placed medication orders.    Alvira Monday, MD 12/19/22 [phone]

## 2022-12-20 MED ORDER — OLANZAPINE 2.5 MG PO TABS
2.5000 mg | ORAL_TABLET | Freq: Two times a day (BID) | ORAL | Status: DC
  Administered 2022-12-20 – 2022-12-26 (×13): 2.5 mg via ORAL
  Filled 2022-12-20 (×20): qty 1

## 2022-12-20 MED ORDER — BACITRACIN ZINC 500 UNIT/GM EX OINT
TOPICAL_OINTMENT | Freq: Two times a day (BID) | CUTANEOUS | Status: DC
  Administered 2022-12-20 – 2022-12-22 (×5): 31.5556 via TOPICAL
  Administered 2022-12-23: 1 via TOPICAL
  Administered 2022-12-23 – 2022-12-26 (×7): 31.5556 via TOPICAL
  Administered 2023-01-04: 1 via TOPICAL
  Administered 2023-01-06 – 2023-01-15 (×17): 31.5556 via TOPICAL
  Filled 2022-12-20 (×67): qty 0.9

## 2022-12-20 NOTE — [facility] Counselor (Signed)
Adult Psychoeducational Group Note  Date:  12/20/2022 Time:  10:41 PM  Group Topic/Focus:  Wrap-Up Group:   The focus of this group is to help patients review their daily goal of treatment and discuss progress on daily workbooks.  Participation Level:  Active  Participation Quality:  Attentive  Affect:  Appropriate  Cognitive:  Alert  Insight: Improving  Engagement in Group:  Engaged  Modes of Intervention:  Discussion  Additional Comments:  Pt attended and participated in group.  [name] Crandall Cassandra 12/20/2022, 10:41 PM

## 2022-12-20 NOTE — Progress Notes (Signed)
   12/20/22 0900  Psych Admission Type (Psych Patients Only)  Admission Status Voluntary  Psychosocial Assessment  Patient Complaints Anxiety  Eye Contact Fair  Facial Expression Anxious  Affect Anxious  Speech Logical/coherent;Pressured  Interaction Assertive  Motor Activity Other (Comment) (steady gait)  Appearance/Hygiene Unremarkable  Behavior Characteristics Cooperative;Appropriate to situation  Mood Anxious  Thought Process  Coherency WDL  Content WDL  Delusions None reported or observed  Perception Hallucinations  Hallucination Auditory  Judgment Impaired  Confusion None  Danger to Self  Current suicidal ideation? Passive  Self-Injurious Behavior No self-injurious ideation or behavior indicators observed or expressed   Agreement Not to Harm Self Yes  Description of Agreement agreed to contact staff before acting on harmful thoughts  Danger to Others  Danger to Others None reported or observed

## 2022-12-20 NOTE — Group Note (Signed)
Date:  12/20/2022 Time:  5:06 PM  Group Topic/Focus:   Emotional Education:   The focus of this group is to discuss what boundaries are, and how they are experienced.    Participation Level:  Active  Participation Quality:  Appropriate  Affect:  Appropriate  Cognitive:  Appropriate  Insight: Appropriate  Engagement in Group:  Engaged  Modes of Intervention:  Discussion and Education  Additional Comments:    Arnoldo Hooker 12/20/2022, 5:06 PM

## 2022-12-20 NOTE — H&P (Addendum)
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  Diagnoses Principal Problem:   Schizoaffective disorder, bipolar type (HCC) Active Problems:   Borderline personality disorder (HCC)   Asperger syndrome   PTSD (post-traumatic stress disorder)   Asthma   Self-injurious behavior   Fibromyalgia  Medications -Discontinue Abilify 2 mg 3 times daily, so as to minimize amount of antipsychotic medications -Start Zyprexa 2.5 mg at 12 PM and at 5 PM, continue 15 mg nightly for psychosis and mood stabilization -Continue gabapentin and reduced to 400 mg 3 times daily starting 11/15 to minimize amount of medications the patient is on (previously  600 mg 3 times daily) -Continue Cymbalta 60 mg daily from fibromyalgia and depressive symptoms -Continue Protonix 40 mg daily for GERD -Continue prazosin 1 mg nightly for nightmares -Continue propafenone to 25 mg twice daily for cardiac arrhythmia -Continue agitation protocol medications as per the MAR   Other PRNS -Continue Tylenol 650 mg every 6 hours PRN for mild pain -Continue Maalox 30 mg every 4 hrs PRN for indigestion -Continue Milk of Magnesia as needed every 6 hrs for constipation  Labs reviewed: QTc is 513, we will repeat EKG on 11/15.  Discharge Planning: Social work and  case management to assist with discharge planning and identification of hospital follow-up needs prior to discharge Estimated LOS: 5-7 days Discharge Concerns: Need to establish a safety plan; Medication compliance and effectiveness Discharge Goals: Return home with outpatient referrals for mental health follow-up including medication management/psychotherapy  I certify that inpatient services furnished can reasonably be expected to improve the patient's condition.    Starleen Blue, NP 11/14/20246:06 PM

## 2022-12-20 NOTE — Progress Notes (Signed)
   12/20/22 2200  Psych Admission Type (Psych Patients Only)  Admission Status Voluntary  Psychosocial Assessment  Patient Complaints Anxiety;Crying spells;Depression;Loneliness  Eye Contact Fair  Facial Expression Flat  Affect Anxious  Speech Logical/coherent  Interaction Attention-seeking  Motor Activity Other (Comment) (WNL)  Appearance/Hygiene Unremarkable  Behavior Characteristics Anxious  Mood Anxious  Thought Process  Coherency WDL  Content WDL  Delusions None reported or observed  Perception Hallucinations  Hallucination Auditory  Judgment Impaired  Confusion None  Danger to Self  Current suicidal ideation? Passive  Self-Injurious Behavior Self-injurious ideation with potentially lethal plan observed or expressed  Agreement Not to Harm Self Yes
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
## 2022-12-20 NOTE — Group Note (Signed)
LCSW Group Therapy Note  Group Date: 12/20/2022 Start Time: 1100 End Time: 1200   Type of Therapy and Topic:  Group Therapy - Healthy vs Unhealthy Coping Skills  Participation Level:  Did Not Attend   Description of Group The focus of this group was to determine what unhealthy coping techniques typically are used by group members and what healthy coping techniques would be helpful in coping with various problems. Patients were guided in becoming aware of the differences between healthy and unhealthy coping techniques. Patients were asked to identify 2-3 healthy coping skills they would like to learn to use more effectively.  Therapeutic Goals Patients learned that coping is what human beings do all day long to deal with various situations in their lives Patients defined and discussed healthy vs unhealthy coping techniques Patients identified their preferred coping techniques and identified whether these were healthy or unhealthy Patients determined 2-3 healthy coping skills they would like to become more familiar with and use more often. Patients provided support and ideas to each other   Summary of Patient Progress:  Did not attend   Therapeutic Modalities Cognitive Behavioral Therapy Motivational Interviewing  Marinda Elk, [place] 12/20/2022  1:43 PM

## 2022-12-20 NOTE — Progress Notes (Signed)
Patient signed a 72 hr request for discharge 12/20/22 @ 1343
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
## 2022-12-20 NOTE — Plan of Care (Signed)
  Problem: Education: Goal: Knowledge of [facility] Education information/materials will improve Outcome: Progressing Goal: Emotional status will improve Outcome: Progressing Goal: Mental status will improve Outcome: Progressing Goal: Verbalization of understanding the information provided will improve Outcome: Progressing   Problem: Activity: Goal: Interest or engagement in activities will improve Outcome: Progressing Goal: Sleeping patterns will improve Outcome: Progressing   Problem: Coping: Goal: Ability to verbalize frustrations and anger appropriately will improve Outcome: Progressing Goal: Ability to demonstrate self-control will improve Outcome: Progressing   Problem: Health Behavior/Discharge Planning: Goal: Identification of resources available to assist in meeting health care needs will improve Outcome: Progressing Goal: Compliance with treatment plan for underlying cause of condition will improve Outcome: Progressing   Problem: Physical Regulation: Goal: Ability to maintain clinical measurements within normal limits will improve Outcome: Progressing   Problem: Safety: Goal: Periods of time without injury will increase Outcome: Progressing   Problem: Education: Goal: Ability to incorporate positive changes in behavior to improve self-esteem will improve Outcome: Progressing   Problem: Health Behavior/Discharge Planning: Goal: Ability to identify and utilize available resources and services will improve Outcome: Progressing Goal: Ability to remain free from injury will improve Outcome: Progressing   Problem: Self-Concept: Goal: Will verbalize positive feelings about self Outcome: Progressing   Problem: Skin Integrity: Goal: Demonstration of wound healing without infection will improve Outcome: Progressing   Problem: Activity: Goal: Will identify at least one activity in which they can participate Outcome: Progressing   Problem:  Coping: Goal: Ability to identify and develop effective coping behavior will improve Outcome: Progressing Goal: Ability to interact with others will improve Outcome: Progressing Goal: Demonstration of participation in decision-making regarding own care will improve Outcome: Progressing Goal: Ability to use eye contact when communicating with others will improve Outcome: Progressing   Problem: Health Behavior/Discharge Planning: Goal: Identification of resources available to assist in meeting health care needs will improve Outcome: Progressing   Problem: Self-Concept: Goal: Will verbalize positive feelings about self Outcome: Progressing

## 2022-12-20 NOTE — Group Note (Signed)
Date:  12/20/2022 Time:  10:54 AM  Group Topic/Focus:  Goals Group:   The focus of this group is to help patients establish daily goals to achieve during treatment and discuss how the patient can incorporate goal setting into their daily lives to aide in recovery. Orientation Group    Participation Level:  Did Not Attend  Participation Quality:    Affect:    Cognitive:    Insight:   Engagement in Group:    Modes of Intervention:    Additional Comments:    Mariaha Ellington D Cleve Paolillo 12/20/2022, 10:54 AM

## 2022-12-20 NOTE — Plan of Care (Signed)
  Problem: Activity: Goal: Interest or engagement in activities will improve Outcome: Progressing   Problem: Health Behavior/Discharge Planning: Goal: Identification of resources available to assist in meeting health care needs will improve Outcome: Progressing

## 2022-12-20 NOTE — Progress Notes (Signed)
   12/20/22 0615  15 Minute Checks  Location Dayroom  Visual Appearance Calm  Behavior Composed  Sleep (Behavioral Health Patients Only)  Calculate sleep? (Click Yes once per 24 hr at 0600 safety check) Yes  Documented sleep last 24 hours 8.5

## 2022-12-20 NOTE — Progress Notes (Addendum)
Pt is alert oriented X4.Affect sad/mood congruent . Pt reported Anxiety and depression which she rated at a 6 and 7 out of 10.  Denies SI/HI but reported  AVH with commands to kill self. Pt reported feeling sad due to not seeing her daughter  for a while. Reported pain on her left arm . Pt got  PRN Ibuprofen .   Scheduled medications administered to patient per MD orders. Support and encouragement provided. Routine safety checks conducted every 15 minutes without incident. Patient informed to notify staff with problems and concerns and verbalized understanding.    No adverse drug noted . Pt compliant with medications and treatment plan. Pt is receptive, calm, cooperative, and interacts well with others on the unit. Pt contracts for safety and remains safe on the unit at this time.

## 2022-12-20 NOTE — Plan of Care (Signed)
  Problem: Education: Goal: Verbalization of understanding the information provided will improve Outcome: Progressing   Problem: Coping: Goal: Ability to verbalize frustrations and anger appropriately will improve Outcome: Progressing

## 2022-12-21 ENCOUNTER — Encounter (HOSPITAL_COMMUNITY): Payer: Self-pay

## 2022-12-21 DIAGNOSIS — F250 Schizoaffective disorder, bipolar type: Secondary | ICD-10-CM | POA: Diagnosis not present

## 2022-12-21 MED ORDER — GABAPENTIN 300 MG PO CAPS
600.0000 mg | ORAL_CAPSULE | Freq: Three times a day (TID) | ORAL | Status: DC
  Administered 2022-12-21 – 2022-12-23 (×5): 600 mg via ORAL
  Filled 2022-12-21 (×13): qty 2

## 2022-12-21 MED ORDER — GABAPENTIN 400 MG PO CAPS
400.0000 mg | ORAL_CAPSULE | Freq: Three times a day (TID) | ORAL | Status: DC
  Filled 2022-12-21 (×5): qty 1

## 2022-12-21 NOTE — BHH Group Notes (Signed)
Adult Psychoeducational Group Note  Date:  12/21/2022 Time:  8:23 PM  Group Topic/Focus:  Wrap-Up Group:   The focus of this group is to help patients review their daily goal of treatment and discuss progress on daily workbooks.  Participation Level:  Active  Participation Quality:  Appropriate and Attentive  Affect:  Flat  Cognitive:  Alert  Insight: Limited  Engagement in Group:  None  Modes of Intervention:  Discussion and Support  Additional Comments:  pt attended AA meeting.  [name] 12/21/2022, 8:23 PM

## 2022-12-21 NOTE — BH IP Treatment Plan (Signed)
Interdisciplinary Treatment and Diagnostic Plan Update  12/21/2022 Time of Session: 11:10 AM (UPDATE)  [name] MRN: [id number]  Principal Diagnosis: Schizoaffective disorder, bipolar type (HCC)  Secondary Diagnoses: Principal Problem:   Schizoaffective disorder, bipolar type (HCC) Active Problems:   Borderline personality disorder (HCC)   Asperger syndrome   PTSD (post-traumatic stress disorder)   Asthma   Self-injurious behavior   Fibromyalgia   Current Medications:  Current Facility-Administered Medications  Medication Dose Route Frequency Provider Last Rate Last Admin   albuterol (PROVENTIL) (2.5 MG/3ML) 0.083% nebulizer solution 2.5 mg  2.5 mg Nebulization Q6H PRN Weber, Kyra A, NP       alum & mag hydroxide-simeth (MAALOX/MYLANTA) 200-200-20 MG/5ML suspension 30 mL  30 mL Oral Q4H PRN Weber, Leavy Cella, NP       bacitracin ointment   Topical BID Starleen Blue, NP   [phone] Application at 12/21/22 0805   diphenhydrAMINE (BENADRYL) capsule 50 mg  50 mg Oral TID PRN Phebe Colla A, NP       Or   diphenhydrAMINE (BENADRYL) injection 50 mg  50 mg Intramuscular TID PRN Weber, Bella Kennedy A, NP       DULoxetine (CYMBALTA) DR capsule 60 mg  60 mg Oral Daily Weber, Kyra A, NP   60 mg at 12/21/22 0800   gabapentin (NEURONTIN) capsule 600 mg  600 mg Oral TID Phebe Colla A, NP   600 mg at 12/21/22 1105   hydrOXYzine (ATARAX) tablet 25 mg  25 mg Oral TID Phebe Colla A, NP   25 mg at 12/21/22 1105   ibuprofen (ADVIL) tablet 600 mg  600 mg Oral Q8H PRN Weber, Kyra A, NP   600 mg at 12/21/22 0950   LORazepam (ATIVAN) tablet 2 mg  2 mg Oral TID PRN Weber, Kyra A, NP       Or   LORazepam (ATIVAN) injection 2 mg  2 mg Intramuscular TID PRN Weber, Bella Kennedy A, NP       magnesium hydroxide (MILK OF MAGNESIA) suspension 30 mL  30 mL Oral Daily PRN Weber, Kyra A, NP   30 mL at 12/21/22 1253   mometasone-formoterol (DULERA) 200-5 MCG/ACT inhaler 2 puff  2 puff Inhalation BID Ajibola, Ene A, NP   2  puff at 12/21/22 0801   OLANZapine (ZYPREXA) tablet 15 mg  15 mg Oral QHS Weber, Kyra A, NP   15 mg at 12/20/22 2008   OLANZapine (ZYPREXA) tablet 2.5 mg  2.5 mg Oral BID Starleen Blue, NP   2.5 mg at 12/21/22 1106   ondansetron (ZOFRAN-ODT) disintegrating tablet 4 mg  4 mg Oral Q8H PRN Weber, Kyra A, NP       pantoprazole (PROTONIX) EC tablet 40 mg  40 mg Oral QAC breakfast Weber, Kyra A, NP   40 mg at 12/21/22 0801   prazosin (MINIPRESS) capsule 1 mg  1 mg Oral QHS Weber, Kyra A, NP   1 mg at 12/20/22 2007   propafenone (RYTHMOL SR) 12 hr capsule 225 mg  225 mg Oral BID Weber, Kyra A, NP   225 mg at 12/21/22 0801   PTA Medications: Medications Prior to Admission  Medication Sig Dispense Refill Last Dose   albuterol (VENTOLIN HFA) 108 (90 Base) MCG/ACT inhaler Inhale 2 puffs into the lungs every 6 (six) hours as needed for wheezing or shortness of breath. 1 each 0    ARIPiprazole (ABILIFY) 2 MG tablet Take 1 tablet (2 mg total) by mouth daily for 7 days. 7  tablet 0    Bacitracin-Polymyxin B (NEOSPORIN EX) Apply 1 Application topically as needed (Cuts).      Calcium Carbonate Antacid (TUMS PO) Take 2 tablets by mouth as needed (Heartburn).      cyclobenzaprine (FLEXERIL) 10 MG tablet Take 10 mg by mouth at bedtime.      diclofenac Sodium (VOLTAREN) 1 % GEL Apply 2 g topically 4 (four) times daily. (Patient taking differently: Apply 2 g topically in the morning, at noon, and at bedtime.)      DULoxetine (CYMBALTA) 60 MG capsule Take 1 capsule (60 mg total) by mouth daily for 7 days. 7 capsule 0    gabapentin (NEURONTIN) 600 MG tablet Take 600 mg by mouth 3 (three) times daily.      hydrOXYzine (ATARAX) 25 MG tablet Take 25 mg by mouth 4 (four) times daily as needed for anxiety.      mometasone-formoterol (DULERA) 200-5 MCG/ACT AERO Inhale 2 puffs into the lungs 2 (two) times daily. 1 each 0    OLANZapine (ZYPREXA) 15 MG tablet Take 15 mg by mouth at bedtime.      OLANZapine (ZYPREXA) 5 MG  tablet Take 3 tablets (15 mg total) by mouth at bedtime for 7 days. (Patient taking differently: Take 5 mg by mouth in the morning and at bedtime. Take 1 tablet at 1200 and 1700) 21 tablet 0    ondansetron (ZOFRAN-ODT) 4 MG disintegrating tablet Take 1 tablet (4 mg total) by mouth every 8 (eight) hours as needed for nausea or vomiting. 10 tablet 0    pantoprazole (PROTONIX) 40 MG tablet Take 1 tablet (40 mg total) by mouth daily before breakfast for 7 days. 7 tablet 0    prazosin (MINIPRESS) 1 MG capsule Take 1 capsule (1 mg total) by mouth at bedtime for 7 days. 7 capsule 0    propafenone (RYTHMOL SR) 225 MG 12 hr capsule Take 225 mg by mouth 2 (two) times daily.       Patient Stressors:    Patient Strengths:    Treatment Modalities: Medication Management, Group therapy, Case management,  1 to 1 session with clinician, Psychoeducation, Recreational therapy.   Physician Treatment Plan for Primary Diagnosis: Schizoaffective disorder, bipolar type (HCC) Long Term Goal(s): Improvement in symptoms so as ready for discharge   Short Term Goals: Ability to identify changes in lifestyle to reduce recurrence of condition will improve Ability to verbalize feelings will improve Ability to disclose and discuss suicidal ideas Ability to demonstrate self-control will improve Ability to identify and develop effective coping behaviors will improve Ability to maintain clinical measurements within normal limits will improve Compliance with prescribed medications will improve  Medication Management: Evaluate patient's response, side effects, and tolerance of medication regimen.  Therapeutic Interventions: 1 to 1 sessions, Unit Group sessions and Medication administration.  Evaluation of Outcomes: Not Progressing  Physician Treatment Plan for Secondary Diagnosis: Principal Problem:   Schizoaffective disorder, bipolar type (HCC) Active Problems:   Borderline personality disorder (HCC)   Asperger  syndrome   PTSD (post-traumatic stress disorder)   Asthma   Self-injurious behavior   Fibromyalgia  Long Term Goal(s): Improvement in symptoms so as ready for discharge   Short Term Goals: Ability to identify changes in lifestyle to reduce recurrence of condition will improve Ability to verbalize feelings will improve Ability to disclose and discuss suicidal ideas Ability to demonstrate self-control will improve Ability to identify and develop effective coping behaviors will improve Ability to maintain clinical measurements within normal limits  will improve Compliance with prescribed medications will improve     Medication Management: Evaluate patient's response, side effects, and tolerance of medication regimen.  Therapeutic Interventions: 1 to 1 sessions, Unit Group sessions and Medication administration.  Evaluation of Outcomes: Not Progressing   RN Treatment Plan for Primary Diagnosis: Schizoaffective disorder, bipolar type (HCC) Long Term Goal(s): Knowledge of disease and therapeutic regimen to maintain health will improve  Short Term Goals: Ability to remain free from injury will improve, Ability to verbalize frustration and anger appropriately will improve, Ability to demonstrate self-control, Ability to participate in decision making will improve, Ability to verbalize feelings will improve, Ability to disclose and discuss suicidal ideas, Ability to identify and develop effective coping behaviors will improve, and Compliance with prescribed medications will improve  Medication Management: RN will administer medications as ordered by provider, will assess and evaluate patient's response and provide education to patient for prescribed medication. RN will report any adverse and/or side effects to prescribing provider.  Therapeutic Interventions: 1 on 1 counseling sessions, Psychoeducation, Medication administration, Evaluate responses to treatment, Monitor vital signs and CBGs as  ordered, Perform/monitor CIWA, COWS, AIMS and Fall Risk screenings as ordered, Perform wound care treatments as ordered.  Evaluation of Outcomes: Not Progressing   LCSW Treatment Plan for Primary Diagnosis: Schizoaffective disorder, bipolar type (HCC) Long Term Goal(s): Safe transition to appropriate next level of care at discharge, Engage patient in therapeutic group addressing interpersonal concerns.  Short Term Goals: Engage patient in aftercare planning with referrals and resources, Increase social support, Increase ability to appropriately verbalize feelings, Increase emotional regulation, Facilitate acceptance of mental health diagnosis and concerns, Facilitate patient progression through stages of change regarding substance use diagnoses and concerns, Identify triggers associated with mental health/substance abuse issues, and Increase skills for wellness and recovery  Therapeutic Interventions: Assess for all discharge needs, 1 to 1 time with Social worker, Explore available resources and support systems, Assess for adequacy in community support network, Educate family and significant other(s) on suicide prevention, Complete Psychosocial Assessment, Interpersonal group therapy.  Evaluation of Outcomes: Not Progressing   Progress in Treatment: Attending groups: Yes. Participating in groups: Yes. Taking medication as prescribed: Yes. Toleration medication: Yes. Family/Significant other contact made: No, will contact:  [name], QP ([employer]) [phone] or Moria Brophy therapist ([employer]) [phone] Patient understands diagnosis: Yes. Discussing patient identified problems/goals with staff: Yes. Medical problems stabilized or resolved: Yes. Denies suicidal/homicidal ideation: Yes. Issues/concerns per patient self-inventory: No.   New problem(s) identified: No, Describe:  None reported   New Short Term/Long Term Goal(s):  medication stabilization, elimination of SI  thoughts, development of comprehensive mental wellness plan.    Patient Goals:  " Go home tomorrow, reach out to my ACT team about bringing forms out to my house, and tell them that I do not want to speak to a female when I call there crisis line "   Discharge Plan or Barriers: Patient recently admitted. CSW will continue to follow and assess for appropriate referrals and possible discharge planning.    Reason for Continuation of Hospitalization: Anxiety Delusions  Depression Medication stabilization Suicidal ideation  Estimated Length of Stay: 4-5 days   Last 3 [place] Suicide Severity Risk Score: Flowsheet Row Admission (Current) [facility] from 12/18/2022 in [facility] ED from 12/17/2022 in [facility] Department [facility]  C-SSRS RISK CATEGORY High Risk High Risk High Risk  Last PHQ 2/9 Scores:    03/10/2022   10:01 PM 03/10/2022    9:56 PM 02/10/2022    1:49 AM  Depression screen PHQ 2/9  Decreased Interest 1 1 2   Down, Depressed, Hopeless 1 1 2   PHQ - 2 Score 2 2 4   Altered sleeping 1 1 2   Tired, decreased energy 1 1 2   Change in appetite 1 0 2  Feeling bad or failure about yourself  1 1 2   Trouble concentrating 1 1 2   Moving slowly or fidgety/restless 1 1 1   Suicidal thoughts 1 1 1   PHQ-9 Score 9 8 16   Difficult doing work/chores Somewhat difficult  Very difficult    Scribe for Treatment Team: Isabella Bowens, LCSWA 12/21/2022 2:15 PM

## 2022-12-21 NOTE — Plan of Care (Signed)
  Problem: Education: Goal: Emotional status will improve Outcome: Progressing Goal: Mental status will improve Outcome: Progressing   Problem: Activity: Goal: Interest or engagement in activities will improve Outcome: Progressing Goal: Sleeping patterns will improve Outcome: Progressing

## 2022-12-21 NOTE — Progress Notes (Signed)
[facility] MD Progress Note  12/21/2022 2:41 PM [name]  MRN:  [id number]  HPI: [name] is a 32 yo Caucasian female who is very familiar to the [facility] behavioral health service line with multiple hospitalizations over the past few months.  She is yet again admitted at this hospital voluntarily on 11/13 after she presented to the [facility] behavioral health urgent care Center initially via [facility] on 11/08 with complaints of SI with a plan to overdose on pills.  Patient was transferred for mental status.   24-hour chart review: Heart rate slightly elevated today morning at 104, DBP was slightly low at 55.  We will continue to keep an eye on this.  Patient is compliant with scheduled medications. No agitation protocol medications given to pt over the past 24 hours, but she was observed to be agitated earlier, talking out loud in her room earlier today morning. Only PRN in the past 24 hrs has been Milk of Magnesia & Ibuprofen.   Patient assessment note: Prior to today's encounter, patient was audible in the hallway, speaking out loudly to herself, and using profanity.  During encounter with Clinical research associate, patient stated that she was hearing voices of her family members, and that they were out to hurt her.  Patient reported that she slept with 2 bags of clothing on her bed, because she wanted to wake up whenever someone was coming to get her.  She however did not seem frightened, was calm once assessment started, and did not seem to be responding to any internal or external stimuli.  She repeatedly stated that she was scared. She however denied SI/HI/VH, presented with delusions of persecution.   She reports poor sleep stating that she was "on guard" all night because when she is by herself she hears voices.  She reports a good appetite, denies medication related side effects.  Patient asking for an increase in the dose of her Zyprexa, and was educated to minimize antipsychotic medications, due to  the tendency for these medications to cause damage to the conduction system of the heart, and she verbalized understanding. Qtc is >500. ordering a repeat, and continuing all medications as listed below. Pt is on Gabapentin 600 mg TID. We discussed decreasing medication to 400 mg 3 times daily, since patient has a history of overdoses in the past, with some ended up in prolonged ICU stays.  We are trying to discharge patient with the least amount of medications possible. No TD/EPS type symptoms found on assessment, and pt denies any feelings of stiffness. AIMS: 0.   Patient signed a 72 hr notice for discharge on 11/14, and is hoping to be discharged over this weekend. She has been educated that the staff will evaluate her progress over the weekend and make a determination regarding discharge.   Principal Problem: Schizoaffective disorder, bipolar type (HCC) Diagnosis: Principal Problem:   Schizoaffective disorder, bipolar type (HCC) Active Problems:   Borderline personality disorder (HCC)   Asperger syndrome   PTSD (post-traumatic stress disorder)   Asthma   Self-injurious behavior   Fibromyalgia  Total Time spent with patient: 45 minutes  Past Psychiatric History: See H & P  Past Medical History:  Past Medical History:  Diagnosis Date   Acid reflux    Adjustment disorder with mixed anxiety and depressed mood 01/31/2022   Adjustment disorder with mixed disturbance of emotions and conduct 08/03/2019   Anxiety    Asthma    last attack 03/13/15 or 03/14/15  Autism    Bipolar 1 disorder, depressed, severe (HCC) 07/25/2021   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    Chronic constipation    Depression    Drug-seeking behavior    Essential tremor    Headache    Ineffective individual coping 05/16/2022   Insomnia 01/12/2022   Intentional drug overdose (HCC) 06/05/2022   Neuromuscular disorder (HCC)    Normocytic anemia 06/05/2022   Overdose 07/22/2017   Overdose of acetaminophen 07/2017   and  other meds   Overdose, intentional self-harm, initial encounter (HCC) 07/20/2021   Paranoia (HCC) 04/22/2021   Personality disorder (HCC)    Purposeful non-suicidal drug ingestion (HCC) 06/27/2021   Schizo-affective psychosis (HCC)    Schizoaffective disorder (HCC) 07/29/2022   Schizoaffective disorder, bipolar type (HCC)    Seizures (HCC)    Last seizure December 2017   Skin erythema 04/27/2022   Sleep apnea    Suicidal behavior 07/25/2021   Suicidal ideation    Suicide (HCC) 07/01/2021   Suicide attempt (HCC) 07/04/2021    Past Surgical History:  Procedure Laterality Date   MOUTH SURGERY  2009 or 2010   Family History:  Family History  Problem Relation Age of Onset   Mental illness Father    Asthma Father    PDD Brother    Seizures Brother    Family Psychiatric  History: See H & P Social History:  Social History   Substance and Sexual Activity  Alcohol Use No   Alcohol/week: 1.0 standard drink of alcohol   Types: 1 Standard drinks or equivalent per week   Comment: denies at this time     Social History   Substance and Sexual Activity  Drug Use No   Comment: History of cocaine use at age 43 for 4 months    Social History   Socioeconomic History   Marital status: Widowed    Spouse name: Not on file   Number of children: 0   Years of education: Not on file   Highest education level: Not on file  Occupational History   Occupation: disability  Tobacco Use   Smoking status: Former    Types: Cigarettes   Smokeless tobacco: Never   Tobacco comments:    Smoked for 2  years age [age]  Vaping Use   Vaping status: Never Used  Substance and Sexual Activity   Alcohol use: No    Alcohol/week: 1.0 standard drink of alcohol    Types: 1 Standard drinks or equivalent per week    Comment: denies at this time   Drug use: No    Comment: History of cocaine use at age 76 for 4 months   Sexual activity: Not Currently    Birth control/protection: None  Other Topics  Concern   Not on file  Social History Narrative   Marital status: Widowed      Children: daughter      Lives: with boyfriend, in two story home      Employment:  Disability      Tobacco: quit smoking; smoked for two years.      Alcohol ;none      Drugs: none   Has not traveled outside of the country.   Right handed         Patient with hx of Fibromyalgia,Orthostatic Tachycardia,Asthma,Arthritis,Gerd, ASD   Social Determinants of Health   Financial Resource Strain: Not on file  Food Insecurity: No Food Insecurity (12/19/2022)   Hunger Vital Sign    Worried About Running  Out of Food in the Last Year: Never true    Ran Out of Food in the Last Year: Never true  Transportation Needs: No Transportation Needs (12/19/2022)   PRAPARE - Administrator, Civil Service (Medical): No    Lack of Transportation (Non-Medical): No  Physical Activity: Not on file  Stress: Not on file  Social Connections: Not on file   Sleep: Good  Appetite:  Fair  Current Medications: Current Facility-Administered Medications  Medication Dose Route Frequency Provider Last Rate Last Admin   albuterol (PROVENTIL) (2.5 MG/3ML) 0.083% nebulizer solution 2.5 mg  2.5 mg Nebulization Q6H PRN Weber, Kyra A, NP       alum & mag hydroxide-simeth (MAALOX/MYLANTA) 200-200-20 MG/5ML suspension 30 mL  30 mL Oral Q4H PRN Weber, Leavy Cella, NP       bacitracin ointment   Topical BID Starleen Blue, NP   [phone] Application at 12/21/22 0805   diphenhydrAMINE (BENADRYL) capsule 50 mg  50 mg Oral TID PRN Phebe Colla A, NP       Or   diphenhydrAMINE (BENADRYL) injection 50 mg  50 mg Intramuscular TID PRN Weber, Bella Kennedy A, NP       DULoxetine (CYMBALTA) DR capsule 60 mg  60 mg Oral Daily Weber, Kyra A, NP   60 mg at 12/21/22 0800   gabapentin (NEURONTIN) capsule 400 mg  400 mg Oral TID Starleen Blue, NP       hydrOXYzine (ATARAX) tablet 25 mg  25 mg Oral TID Weber, Kyra A, NP   25 mg at 12/21/22 1105   ibuprofen (ADVIL)  tablet 600 mg  600 mg Oral Q8H PRN Weber, Kyra A, NP   600 mg at 12/21/22 0950   LORazepam (ATIVAN) tablet 2 mg  2 mg Oral TID PRN Weber, Kyra A, NP       Or   LORazepam (ATIVAN) injection 2 mg  2 mg Intramuscular TID PRN Weber, Kyra A, NP       magnesium hydroxide (MILK OF MAGNESIA) suspension 30 mL  30 mL Oral Daily PRN Weber, Kyra A, NP   30 mL at 12/21/22 1253   mometasone-formoterol (DULERA) 200-5 MCG/ACT inhaler 2 puff  2 puff Inhalation BID Ajibola, Ene A, NP   2 puff at 12/21/22 0801   OLANZapine (ZYPREXA) tablet 15 mg  15 mg Oral QHS Weber, Kyra A, NP   15 mg at 12/20/22 2008   OLANZapine (ZYPREXA) tablet 2.5 mg  2.5 mg Oral BID Starleen Blue, NP   2.5 mg at 12/21/22 1106   ondansetron (ZOFRAN-ODT) disintegrating tablet 4 mg  4 mg Oral Q8H PRN Weber, Kyra A, NP       pantoprazole (PROTONIX) EC tablet 40 mg  40 mg Oral QAC breakfast Weber, Kyra A, NP   40 mg at 12/21/22 0801   prazosin (MINIPRESS) capsule 1 mg  1 mg Oral QHS Weber, Kyra A, NP   1 mg at 12/20/22 2007   propafenone (RYTHMOL SR) 12 hr capsule 225 mg  225 mg Oral BID Weber, Kyra A, NP   225 mg at 12/21/22 0801    Lab Results: No results found for this or any previous visit (from the past 48 hour(s)).  Blood Alcohol level:  Lab Results  Component Value Date   [place] <10 12/18/2022   ETH <10 12/14/2022    Metabolic Disorder Labs: Lab Results  Component Value Date   HGBA1C 5.4 10/22/2022   MPG 108 10/22/2022   MPG 103  04/20/2022   Lab Results  Component Value Date   PROLACTIN 20.0 12/14/2022   PROLACTIN 66.2 (H) 03/02/2021   Lab Results  Component Value Date   CHOL 188 12/14/2022   TRIG 222 (H) 12/14/2022   HDL 41 12/14/2022   CHOLHDL 4.6 12/14/2022   VLDL 44 (H) 12/14/2022   LDLCALC 103 (H) 12/14/2022   LDLCALC 117 (H) 10/22/2022    Physical Findings: AIMS:  , ,  ,  ,    CIWA:    COWS:     Musculoskeletal: Strength & Muscle Tone: within normal limits Gait & Station: normal Patient leans:  N/A  Psychiatric Specialty Exam:  Presentation  General Appearance:  Fairly Groomed  Eye Contact: Minimal  Speech: Clear and Coherent  Speech Volume: Decreased  Handedness: Right   Mood and Affect  Mood: Anxious; Depressed  Affect: Congruent   Thought Process  Thought Processes: Coherent  Descriptions of Associations:Intact  Orientation:Full (Time, Place and Person)  Thought Content:Illogical  History of Schizophrenia/Schizoaffective disorder:Yes  Duration of Psychotic Symptoms:Greater than six months  Hallucinations:Hallucinations: None  Ideas of Reference:Paranoia; Percusatory  Suicidal Thoughts:Suicidal Thoughts: No  Homicidal Thoughts:Homicidal Thoughts: No   Sensorium  Memory: Immediate Fair  Judgment: Poor  Insight: Poor   Executive Functions  Concentration: Fair  Attention Span: Fair  Recall: Fair  Fund of Knowledge: Fair  Language: Fair   Psychomotor Activity  Psychomotor Activity: Psychomotor Activity: Normal   Assets  Assets: Manufacturing systems engineer; Resilience   Sleep  Sleep: Sleep: Fair    Physical Exam: Physical Exam Constitutional:      Appearance: She is obese.  Musculoskeletal:        General: Normal range of motion.  Neurological:     Mental Status: She is alert and oriented to person, place, and time.    Review of Systems  Psychiatric/Behavioral:  Positive for depression and hallucinations. Negative for memory loss, substance abuse and suicidal ideas. The patient is nervous/anxious and has insomnia.   All other systems reviewed and are negative.  Blood pressure (!) 112/55, pulse (!) 104, temperature 97.6 F (36.4 C), temperature source Oral, resp. rate 18, height 5\' 4"  (1.626 m), weight 112.5 kg, SpO2 99%. Body mass index is 42.57 kg/m.  Treatment Plan Summary: Daily contact with patient to assess and evaluate symptoms and progress in treatment and Medication management   Safety and  Monitoring: Voluntary admission to inpatient psychiatric unit for safety, stabilization and treatment Daily contact with patient to assess and evaluate symptoms and progress in treatment Patient's case to be discussed in multi-disciplinary team meeting Observation Level : q15 minute checks Vital signs: q12 hours Precautions: Safety   Long Term Goal(s): Improvement in symptoms so as ready for discharge   Short Term Goals: Ability to identify changes in lifestyle to reduce recurrence of condition will improve, Ability to verbalize feelings will improve, Ability to disclose and discuss suicidal ideas, Ability to demonstrate self-control will improve, Ability to identify and develop effective coping behaviors will improve, Ability to maintain clinical measurements within normal limits will improve, and Compliance with prescribed medications will improve   Diagnoses Principal Problem:   Schizoaffective disorder, bipolar type (HCC) Active Problems:   Borderline personality disorder (HCC)   Asperger syndrome   PTSD (post-traumatic stress disorder)   Asthma   Self-injurious behavior   Fibromyalgia   Medications -Discontinued Abilify 2 mg 3 times daily, so as to minimize amount of antipsychotic medications to one only. -Continue Zyprexa 2.5 mg at 12 PM and  at 5 PM, continue 15 mg nightly for psychosis and mood stabilization -Continue gabapentin and reduced to 400 mg 3 times daily starting 11/15 to minimize amount of medications the patient is on (previously 600 mg 3 times daily) -Continue Cymbalta 60 mg daily from fibromyalgia and depressive symptoms -Continue Protonix 40 mg daily for GERD -Continue prazosin 1 mg nightly for nightmares -Continue propafenone to 25 mg twice daily for cardiac arrhythmia -Continue agitation protocol medications as per the MAR     Other PRNS -Continue Tylenol 650 mg every 6 hours PRN for mild pain -Continue Maalox 30 mg every 4 hrs PRN for  indigestion -Continue Milk of Magnesia as needed every 6 hrs for constipation   Labs reviewed: QTc is 513, repeat EKG on 11/15.   Discharge Planning: Social work and case management to assist with discharge planning and identification of hospital follow-up needs prior to discharge Estimated LOS: 5-7 days Discharge Concerns: Need to establish a safety plan; Medication compliance and effectiveness Discharge Goals: Return home with outpatient referrals for mental health follow-up including medication management/psychotherapy   I certify that inpatient services furnished can reasonably be expected to improve the patient's condition.    Starleen Blue, NP 12/21/2022, 2:41 PM

## 2022-12-21 NOTE — Progress Notes (Signed)
Pt given HS medications early at pt request " It takes an hour and a half for them to kick in"     12/21/22 2030  Psych Admission Type (Psych Patients Only)  Admission Status Voluntary  Psychosocial Assessment  Patient Complaints Anxiety  Eye Contact Fair  Facial Expression Flat  Affect Anxious  Speech Logical/coherent  Interaction Attention-seeking  Motor Activity Fidgety  Appearance/Hygiene Unremarkable  Behavior Characteristics Anxious  Mood Anxious;Depressed  Aggressive Behavior  Effect No apparent injury  Thought Process  Coherency WDL  Content WDL  Delusions WDL  Perception WDL  Hallucination Auditory  Judgment Impaired  Confusion WDL  Danger to Self  Current suicidal ideation? Denies  Danger to Others  Danger to Others None reported or observed

## 2022-12-21 NOTE — Plan of Care (Signed)
Nurse discussed anxiety, depression and coping skills with patient.  

## 2022-12-21 NOTE — Group Note (Signed)
Recreation Therapy Group Note   Group Topic:Problem Solving  Group Date: 12/21/2022 Start Time: 0945 End Time: 1005 Facilitators: Esa Raden-McCall, LRT,CTRS Location: [address] Dayroom   Group Topic: Communication, Team Building, Problem Solving  Goal Area(s) Addresses:  Patient will effectively work with peer towards shared goal.  Patient will identify skills used to make activity successful.  Patient will share challenges and verbalize solution-driven approaches used. Patient will identify how skills used during activity can be used to reach post d/c goals.   Intervention: STEM Activity   Group Description: [employer]. Patients were provided the following materials: 5 drinking straws, 5 rubber bands, 5 paper clips, 2 index cards and 2 drinking cups. Using the provided materials patients were asked to build a launching mechanism to launch a ping pong ball across the room, approximately 10 feet. Patients were divided into teams of 3-5. Instructions required all materials be incorporated into the device, functionality of items left to the peer group's discretion.  Education: Pharmacist, community, Scientist, physiological, Air cabin crew, Building control surveyor.   Education Outcome: Acknowledges education/In group clarification offered/Needs additional education.    Affect/Mood: Flat   Participation Level: None   Participation Quality: None   Behavior: On-looking   Speech/Thought Process: None   Insight: None   Judgement: None   Modes of Intervention: STEM Activity   Patient Response to Interventions:  Disengaged   Education Outcome:  In group clarification offered    Clinical Observations/Individualized Feedback: Pt attended group session but didn't participate in group. Pt was observant as peers worked on Health visitor.      Plan: Continue to engage patient in RT group sessions 2-3x/week.   Isabella Ida-McCall, LRT,CTRS 12/21/2022 12:47 PM

## 2022-12-21 NOTE — Group Note (Signed)
Date:  12/21/2022 Time:  1:03 PM  Group Topic/Focus:  Wellness Toolbox:   The focus of this group is to discuss various aspects of wellness, balancing those aspects and exploring ways to increase the ability to experience wellness.  Patients will create a wellness toolbox for use upon discharge.    Participation Level:  Active  Participation Quality:  Appropriate  Affect:  Appropriate  Cognitive:  Appropriate  Insight: Appropriate  Engagement in Group:  Engaged  Modes of Intervention:  Education  Additional Comments:     Reymundo Poll 12/21/2022, 1:03 PM

## 2022-12-21 NOTE — Progress Notes (Signed)
D:  Patient's self inventory sheet, patient sleeps good, sleep medication helpful.  Fair appetite, low energy level, good concentration.  Rated depression 5, hopeless 1, anxiety 10.  Denied withdrawals.  Denied SI.  Denied physical problems.  Goal is stay on milieu.  Plans to discharge home. A:  Medications administered per MD orders.  Emotional support and encouragement given patient. R:  Denied SI and HI, contracts for safety.  Denied A/V hallucinations.  Safety maintained with 15 minute checks.

## 2022-12-21 NOTE — Progress Notes (Signed)
Patient will discuss her meds with MD tomorrow.

## 2022-12-22 DIAGNOSIS — F250 Schizoaffective disorder, bipolar type: Secondary | ICD-10-CM | POA: Diagnosis not present

## 2022-12-22 MED ORDER — OLANZAPINE 10 MG PO TABS
10.0000 mg | ORAL_TABLET | Freq: Every day | ORAL | Status: DC
  Administered 2022-12-22: 10 mg via ORAL
  Filled 2022-12-22 (×4): qty 1

## 2022-12-22 NOTE — ED Provider Notes (Signed)
[place] [facility] DE[facility]MENT AT [facility] Provider Note   CSN: [id number] Arrival date & time: 12/22/22  2156     History  Chief Complaint  Patient presents with   Swallowed Foreign Body    [name] is a 32 y.o. female.  Pt is an inpatient at [facility], where reportedly had ingested part of a pencil and baby soap. Pt reported feeling nauseated. No vomiting. No other ingestion. No abd pain. No trouble swallowing.   The history is provided by the patient, medical records and the EMS personnel.  Swallowed Foreign Body Pertinent negatives include no shortness of breath.       Home Medications Prior to Admission medications   Medication Sig Start Date End Date Taking? Authorizing Provider  albuterol (VENTOLIN HFA) 108 (90 Base) MCG/ACT inhaler Inhale 2 puffs into the lungs every 6 (six) hours as needed for wheezing or shortness of breath. 10/31/22   Massengill, Harrold Donath, MD  ARIPiprazole (ABILIFY) 2 MG tablet Take 1 tablet (2 mg total) by mouth daily for 7 days. 10/31/22 12/05/23  Phineas Inches, MD  Bacitracin-Polymyxin B (NEOSPORIN EX) Apply 1 Application topically as needed (Cuts).    [provider]  Calcium Carbonate Antacid (TUMS PO) Take 2 tablets by mouth as needed (Heartburn).    [provider]  cyclobenzaprine (FLEXERIL) 10 MG tablet Take 10 mg by mouth at bedtime.    [provider]  diclofenac Sodium (VOLTAREN) 1 % GEL Apply 2 g topically 4 (four) times daily. Patient taking differently: Apply 2 g topically in the morning, at noon, and at bedtime. 10/19/22   Osvaldo Shipper, MD  DULoxetine (CYMBALTA) 60 MG capsule Take 1 capsule (60 mg total) by mouth daily for 7 days. 11/01/22 12/05/23  Massengill, Harrold Donath, MD  gabapentin (NEURONTIN) 600 MG tablet Take 600 mg by mouth 3 (three) times daily. 12/04/22   [provider]  hydrOXYzine (ATARAX) 25 MG tablet Take 25 mg by mouth 4 (four) times daily as needed for  anxiety.    [provider]  mometasone-formoterol (DULERA) 200-5 MCG/ACT AERO Inhale 2 puffs into the lungs 2 (two) times daily. 10/31/22   Massengill, Harrold Donath, MD  OLANZapine (ZYPREXA) 15 MG tablet Take 15 mg by mouth at bedtime.    [provider]  OLANZapine (ZYPREXA) 5 MG tablet Take 3 tablets (15 mg total) by mouth at bedtime for 7 days. Patient taking differently: Take 5 mg by mouth in the morning and at bedtime. Take 1 tablet at 1200 and 1700 10/31/22 12/05/23  Massengill, Harrold Donath, MD  ondansetron (ZOFRAN-ODT) 4 MG disintegrating tablet Take 1 tablet (4 mg total) by mouth every 8 (eight) hours as needed for nausea or vomiting. 12/17/22   Roxy Horseman, PA-C  pantoprazole (PROTONIX) 40 MG tablet Take 1 tablet (40 mg total) by mouth daily before breakfast for 7 days. 10/31/22 12/05/23  Massengill, Harrold Donath, MD  prazosin (MINIPRESS) 1 MG capsule Take 1 capsule (1 mg total) by mouth at bedtime for 7 days. 10/31/22 12/05/23  Massengill, Harrold Donath, MD  propafenone (RYTHMOL SR) 225 MG 12 hr capsule Take 225 mg by mouth 2 (two) times daily.    [provider]      Allergies    Bee venom, Coconut flavor, Fish allergy, Geodon [ziprasidone hcl], Haloperidol and related, Lithobid [lithium], Roxicodone [oxycodone], Seroquel [quetiapine], Shellfish allergy, Phenergan [promethazine hcl], Prilosec [omeprazole], Sulfa antibiotics, Tegretol [carbamazepine], Prozac [fluoxetine], Tape, and Tylenol [acetaminophen]    Review of Systems   Review of Systems  Constitutional:  Negative for fever.  Respiratory:  Negative for shortness of breath.   Gastrointestinal:  Negative for vomiting.    Physical Exam Updated Vital Signs BP 129/78 (BP Location: Right Arm)   Pulse (!) 106   Temp 98.6 F (37 C) (Oral)   Resp 20   Ht 1.626 m (5\' 4" )   Wt 112.5 kg   SpO2 100%   BMI 42.57 kg/m  Physical Exam Vitals and nursing note reviewed.  Constitutional:      Appearance: Normal appearance. She  is well-developed.  HENT:     Head: Atraumatic.     Nose: Nose normal.     Mouth/Throat:     Mouth: Mucous membranes are moist.     Pharynx: Oropharynx is clear.  Eyes:     General: No scleral icterus.    Conjunctiva/sclera: Conjunctivae normal.     Pupils: Pupils are equal, round, and reactive to light.  Neck:     Trachea: No tracheal deviation.  Cardiovascular:     Rate and Rhythm: Normal rate.     Pulses: Normal pulses.     Heart sounds: Normal heart sounds.  Pulmonary:     Effort: Pulmonary effort is normal. No respiratory distress.     Breath sounds: Normal breath sounds.  Abdominal:     General: Bowel sounds are normal. There is no distension.     Palpations: Abdomen is soft.     Tenderness: There is no abdominal tenderness.  Musculoskeletal:        General: No swelling.     Cervical back: Neck supple. No muscular tenderness.  Skin:    General: Skin is warm and dry.     Findings: No rash.  Neurological:     Mental Status: She is alert.     Comments: Alert, speech normal. Motor/sens grossly intact bil.   Psychiatric:        Mood and Affect: Mood normal.     ED Results / Procedures / Treatments   Labs (all labs ordered are listed, but only abnormal results are displayed) Labs Reviewed - No data to display  EKG None  Radiology No results found.  Procedures Procedures    Medications Ordered in ED Medications  albuterol (PROVENTIL) (2.5 MG/3ML) 0.083% nebulizer solution 2.5 mg (has no administration in time range)  DULoxetine (CYMBALTA) DR capsule 60 mg (60 mg Oral Given 12/22/22 0743)  hydrOXYzine (ATARAX) tablet 25 mg (25 mg Oral Given 12/22/22 1705)  ibuprofen (ADVIL) tablet 600 mg (600 mg Oral Given 12/22/22 1257)  ondansetron (ZOFRAN-ODT) disintegrating tablet 4 mg (4 mg Oral Given 12/22/22 1838)  pantoprazole (PROTONIX) EC tablet 40 mg (40 mg Oral Given 12/22/22 0749)  prazosin (MINIPRESS) capsule 1 mg (1 mg Oral Given 12/21/22 2013)  propafenone  (RYTHMOL SR) 12 hr capsule 225 mg (225 mg Oral Not Given 12/22/22 1639)  alum & mag hydroxide-simeth (MAALOX/MYLANTA) 200-200-20 MG/5ML suspension 30 mL (has no administration in time range)  magnesium hydroxide (MILK OF MAGNESIA) suspension 30 mL (30 mLs Oral Given 12/21/22 1253)  LORazepam (ATIVAN) tablet 2 mg (2 mg Oral Given 12/22/22 2038)    Or  LORazepam (ATIVAN) injection 2 mg ( Intramuscular See Alternative 12/22/22 2038)  diphenhydrAMINE (BENADRYL) capsule 50 mg (50 mg Oral Given 12/22/22 2038)    Or  diphenhydrAMINE (BENADRYL) injection 50 mg ( Intramuscular See Alternative 12/22/22 2038)  mometasone-formoterol (DULERA) 200-5 MCG/ACT inhaler 2 puff (2 puffs Inhalation Not Given 12/22/22 1640)  bacitracin ointment (31.5556 Applications Topical Given  12/22/22 0743)  OLANZapine (ZYPREXA) tablet 2.5 mg (2.5 mg Oral Given 12/22/22 1637)  gabapentin (NEURONTIN) capsule 600 mg (600 mg Oral Given 12/22/22 1411)  OLANZapine (ZYPREXA) tablet 10 mg (has no administration in time range)    ED Course/ Medical Decision Making/ A&P                                 Medical Decision Making Problems Addressed: Ingestion of foreign substance, initial encounter: acute illness or injury  Amount and/or Complexity of Data Reviewed Independent Historian: EMS    Details: hx External Data Reviewed: notes.   Reviewed nursing notes and prior charts for additional history.   Reported ingestion appears not toxic/not worrisome.   Po fluids. No emesis.  Recheck abd soft nt.   Pt appears stable for return to her inpatient unit at [facility].  Return precautions provided.         Final Clinical Impression(s) / ED Diagnoses Final diagnoses:  None    Rx / DC Orders ED Discharge Orders     None         Cathren Laine, MD 12/22/22 2300

## 2022-12-22 NOTE — Progress Notes (Signed)
Patient reports, "I just swallowed two pieces of pencils, part of styrofoam cup, and drank some soap (baby shampoo). I'm just tired of living like this, my family will not let me alone, and I can't stand people treating me like garbage".  Provided notified, awaits call back from on-call provider for further instructions.

## 2022-12-22 NOTE — ED Notes (Signed)
Safe transport set up for pt.  

## 2022-12-22 NOTE — Group Note (Signed)
Date:  12/22/2022 Time:  12:13 PM  Group Topic/Focus:  Goals Group:   The focus of this group is to help patients establish daily goals to achieve during treatment and discuss how the patient can incorporate goal setting into their daily lives to aide in recovery.    Participation Level:  Active  [name] [name] 12/22/2022, 12:13 PM

## 2022-12-22 NOTE — Progress Notes (Signed)
Received  telephone order from the provider to send patient out to [facility] ED for medical clearance.

## 2022-12-22 NOTE — ED Triage Notes (Signed)
Pt BIB [facility] from behavioral health. Pt ingested baby soap and half of a pencil. Pt c/o nausea.  142/98 110HR 95% ra 150 cbg

## 2022-12-22 NOTE — Group Note (Signed)
Date:  12/22/2022 Time:  11:38 AM  Group Topic/Focus:  Goals Group:   The focus of this group is to help patients establish daily goals to achieve during treatment and discuss how the patient can incorporate goal setting into their daily lives to aide in recovery.    Participation Level:  Active  Deforest Hoyles Pedram Goodchild 12/22/2022, 11:38 AM

## 2022-12-22 NOTE — Progress Notes (Signed)
Patient tearful but calm at this time. EMS arrived at the unit, patient transported to the [facility] ED, accompanied by [facility].

## 2022-12-22 NOTE — Discharge Instructions (Addendum)
It was our pleasure to provide your ER care today - we hope that you feel better. Drink plenty of fluids/stay well hydrated.   Patient may return to inpatient unit at [facility]. Make sure to keep things that may be ingested out of reach or access of patient.   Return to ER if worse, new symptoms, persistent vomiting, trouble breathing, new/severe pain, or other concern.

## 2022-12-22 NOTE — Progress Notes (Signed)
Report called to [facility] ED Charge nurse.

## 2022-12-22 NOTE — Progress Notes (Signed)
[facility] MD Progress Note  12/22/2022 12:02 PM [name]  MRN:  [id number]  HPI: [name]. [name] is a 32 yo Caucasian female who is very familiar to the [facility] behavioral health service line with multiple hospitalizations over the past few months.  She is yet again admitted at this hospital voluntarily on 11/13 after she presented to the [facility] behavioral health urgent care Center initially via [facility] on 11/08 with complaints of SI with a plan to overdose on pills.  Patient was transferred for mental status.   24-hour chart review: Patient is compliant with scheduled medications. No agitation protocol medications given to pt over the past 24 hours.  Patient assessment note: Patient was seen in her room during rounds.  She continues to present as anxious, but denies suicidal or homicidal ideation.  She denies auditory or visual hallucinations.  She is focused on discharge, but agrees to rescind her 72-hour notice and stay another day as she has a prolonged QTc interval on her EKG and medications need to be titrated for safety.  QTc was 482 on 11/8 and 513 on 11/12.  Will plan on rechecking today, and consider adjusting propafenone if it remains elevated above 500.  Principal Problem: Schizoaffective disorder, bipolar type (HCC) Diagnosis: Principal Problem:   Schizoaffective disorder, bipolar type (HCC) Active Problems:   Borderline personality disorder (HCC)   Asperger syndrome   PTSD (post-traumatic stress disorder)   Asthma   Self-injurious behavior   Fibromyalgia  Total Time spent with patient: 45 minutes  Past Psychiatric History: See H & P  Past Medical History:  Past Medical History:  Diagnosis Date   Acid reflux    Adjustment disorder with mixed anxiety and depressed mood 01/31/2022   Adjustment disorder with mixed disturbance of emotions and conduct 08/03/2019   Anxiety    Asthma    last attack 03/13/15 or 03/14/15   Autism    Bipolar 1 disorder, depressed,  severe (HCC) 07/25/2021   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    Chronic constipation    Depression    Drug-seeking behavior    Essential tremor    Headache    Ineffective individual coping 05/16/2022   Insomnia 01/12/2022   Intentional drug overdose (HCC) 06/05/2022   Neuromuscular disorder (HCC)    Normocytic anemia 06/05/2022   Overdose 07/22/2017   Overdose of acetaminophen 07/2017   and other meds   Overdose, intentional self-harm, initial encounter (HCC) 07/20/2021   Paranoia (HCC) 04/22/2021   Personality disorder (HCC)    Purposeful non-suicidal drug ingestion (HCC) 06/27/2021   Schizo-affective psychosis (HCC)    Schizoaffective disorder (HCC) 07/29/2022   Schizoaffective disorder, bipolar type (HCC)    Seizures (HCC)    Last seizure December 2017   Skin erythema 04/27/2022   Sleep apnea    Suicidal behavior 07/25/2021   Suicidal ideation    Suicide (HCC) 07/01/2021   Suicide attempt (HCC) 07/04/2021    Past Surgical History:  Procedure Laterality Date   MOUTH SURGERY  2009 or 2010   Family History:  Family History  Problem Relation Age of Onset   Mental illness Father    Asthma Father    PDD Brother    Seizures Brother    Family Psychiatric  History: See H & P Social History:  Social History   Substance and Sexual Activity  Alcohol Use No   Alcohol/week: 1.0 standard drink of alcohol   Types: 1 Standard drinks or equivalent per week   Comment:  denies at this time     Social History   Substance and Sexual Activity  Drug Use No   Comment: History of cocaine use at age 48 for 4 months    Social History   Socioeconomic History   Marital status: Widowed    Spouse name: Not on file   Number of children: 0   Years of education: Not on file   Highest education level: Not on file  Occupational History   Occupation: disability  Tobacco Use   Smoking status: Former    Types: Cigarettes   Smokeless tobacco: Never   Tobacco comments:    Smoked for 2   years age [age]  Vaping Use   Vaping status: Never Used  Substance and Sexual Activity   Alcohol use: No    Alcohol/week: 1.0 standard drink of alcohol    Types: 1 Standard drinks or equivalent per week    Comment: denies at this time   Drug use: No    Comment: History of cocaine use at age 2 for 4 months   Sexual activity: Not Currently    Birth control/protection: None  Other Topics Concern   Not on file  Social History Narrative   Marital status: Widowed      Children: daughter      Lives: with boyfriend, in two story home      Employment:  Disability      Tobacco: quit smoking; smoked for two years.      Alcohol ;none      Drugs: none   Has not traveled outside of the country.   Right handed         Patient with hx of Fibromyalgia,Orthostatic Tachycardia,Asthma,Arthritis,Gerd, ASD   Social Determinants of Health   Financial Resource Strain: Not on file  Food Insecurity: No Food Insecurity (12/19/2022)   Hunger Vital Sign    Worried About Running Out of Food in the Last Year: Never true    Ran Out of Food in the Last Year: Never true  Transportation Needs: No Transportation Needs (12/19/2022)   PRAPARE - Administrator, Civil Service (Medical): No    Lack of Transportation (Non-Medical): No  Physical Activity: Not on file  Stress: Not on file  Social Connections: Not on file   Sleep: Good  Appetite:  Fair  Current Medications: Current Facility-Administered Medications  Medication Dose Route Frequency Provider Last Rate Last Admin   albuterol (PROVENTIL) (2.5 MG/3ML) 0.083% nebulizer solution 2.5 mg  2.5 mg Nebulization Q6H PRN Weber, Kyra A, NP       alum & mag hydroxide-simeth (MAALOX/MYLANTA) 200-200-20 MG/5ML suspension 30 mL  30 mL Oral Q4H PRN Weber, Bella Kennedy A, NP       bacitracin ointment   Topical BID Starleen Blue, NP   [phone] Application at 12/22/22 0743   diphenhydrAMINE (BENADRYL) capsule 50 mg  50 mg Oral TID PRN Weber, Bella Kennedy A, NP        Or   diphenhydrAMINE (BENADRYL) injection 50 mg  50 mg Intramuscular TID PRN Weber, Kyra A, NP       DULoxetine (CYMBALTA) DR capsule 60 mg  60 mg Oral Daily Weber, Kyra A, NP   60 mg at 12/22/22 0743   gabapentin (NEURONTIN) capsule 600 mg  600 mg Oral TID Starleen Blue, NP   600 mg at 12/22/22 0744   hydrOXYzine (ATARAX) tablet 25 mg  25 mg Oral TID Phebe Colla A, NP   25 mg at 12/22/22  1130   ibuprofen (ADVIL) tablet 600 mg  600 mg Oral Q8H PRN Weber, Kyra A, NP   600 mg at 12/21/22 0950   LORazepam (ATIVAN) tablet 2 mg  2 mg Oral TID PRN Weber, Bella Kennedy A, NP       Or   LORazepam (ATIVAN) injection 2 mg  2 mg Intramuscular TID PRN Weber, Bella Kennedy A, NP       magnesium hydroxide (MILK OF MAGNESIA) suspension 30 mL  30 mL Oral Daily PRN Weber, Kyra A, NP   30 mL at 12/21/22 1253   mometasone-formoterol (DULERA) 200-5 MCG/ACT inhaler 2 puff  2 puff Inhalation BID Ajibola, Ene A, NP   2 puff at 12/22/22 0749   OLANZapine (ZYPREXA) tablet 15 mg  15 mg Oral QHS Weber, Kyra A, NP   15 mg at 12/21/22 2013   OLANZapine (ZYPREXA) tablet 2.5 mg  2.5 mg Oral BID Starleen Blue, NP   2.5 mg at 12/22/22 1131   ondansetron (ZOFRAN-ODT) disintegrating tablet 4 mg  4 mg Oral Q8H PRN Weber, Kyra A, NP   4 mg at 12/21/22 1444   pantoprazole (PROTONIX) EC tablet 40 mg  40 mg Oral QAC breakfast Weber, Kyra A, NP   40 mg at 12/22/22 0749   prazosin (MINIPRESS) capsule 1 mg  1 mg Oral QHS Weber, Kyra A, NP   1 mg at 12/21/22 2013   propafenone (RYTHMOL SR) 12 hr capsule 225 mg  225 mg Oral BID Weber, Kyra A, NP   225 mg at 12/22/22 0750    Lab Results: No results found for this or any previous visit (from the past 48 hour(s)).  Blood Alcohol level:  Lab Results  Component Value Date   ETH <10 12/18/2022   ETH <10 12/14/2022    Metabolic Disorder Labs: Lab Results  Component Value Date   HGBA1C 5.4 10/22/2022   MPG 108 10/22/2022   MPG 103 04/20/2022   Lab Results  Component Value Date   PROLACTIN 20.0  12/14/2022   PROLACTIN 66.2 (H) 03/02/2021   Lab Results  Component Value Date   CHOL 188 12/14/2022   TRIG 222 (H) 12/14/2022   HDL 41 12/14/2022   CHOLHDL 4.6 12/14/2022   VLDL 44 (H) 12/14/2022   LDLCALC 103 (H) 12/14/2022   LDLCALC 117 (H) 10/22/2022    Physical Findings: AIMS:  , ,  ,  ,    CIWA:    COWS:     Musculoskeletal: Strength & Muscle Tone: within normal limits Gait & Station: normal Patient leans: N/A  Psychiatric Specialty Exam:  Presentation  General Appearance:  Appropriate for Environment  Eye Contact: Good  Speech: Clear and Coherent  Speech Volume: Normal  Handedness: Right   Mood and Affect  Mood: Anxious  Affect: Congruent; Restricted   Thought Process  Thought Processes: Linear  Descriptions of Associations:Intact  Orientation:Full (Time, Place and Person)  Thought Content:Logical  History of Schizophrenia/Schizoaffective disorder:Yes  Duration of Psychotic Symptoms:Greater than six months  Hallucinations:Hallucinations: None  Ideas of Reference:None  Suicidal Thoughts:Suicidal Thoughts: No  Homicidal Thoughts:Homicidal Thoughts: No   Sensorium  Memory: Immediate Good; Recent Good  Judgment: Poor  Insight: Fair   Art therapist  Concentration: Good  Attention Span: Good  Recall: Good  Fund of Knowledge: Good  Language: Good   Psychomotor Activity  Psychomotor Activity: Psychomotor Activity: Increased   Assets  Assets: Communication Skills; Housing   Sleep  Sleep: Sleep: Good Number of Hours of Sleep: 9  Physical Exam: General: Sitting comfortably. NAD. HEENT: Normocephalic, atraumatic, MMM, EMOI Lungs: no increased work of breathing noted Heart: no cyanosis Abdomen: Non distended Musculoskeletal: FROM. No obvious deformities Skin: Warm, dry, intact. No rashes noted Neuro: No obvious focal deficits.  Gait and station are normal  Review of Systems   Constitutional: Negative.   HENT: Negative.    Eyes: Negative.   Respiratory: Negative.    Cardiovascular: Negative.   Gastrointestinal: Negative.   Genitourinary: Negative.   Skin: Negative.   Neurological: Negative.   Psychiatric/Behavioral:  Positive for anxiety.     Blood pressure 110/65, pulse (!) 106, temperature 98 F (36.7 C), temperature source Oral, resp. rate 20, height 5\' 4"  (1.626 m), weight 112.5 kg, SpO2 100%. Body mass index is 42.57 kg/m.  Treatment Plan Summary: Daily contact with patient to assess and evaluate symptoms and progress in treatment and Medication management   Safety and Monitoring: Voluntary admission to inpatient psychiatric unit for safety, stabilization and treatment Daily contact with patient to assess and evaluate symptoms and progress in treatment Patient's case to be discussed in multi-disciplinary team meeting Observation Level : q15 minute checks Vital signs: q12 hours Precautions: Safety   Long Term Goal(s): Improvement in symptoms so as ready for discharge   Short Term Goals: Ability to identify changes in lifestyle to reduce recurrence of condition will improve, Ability to verbalize feelings will improve, Ability to disclose and discuss suicidal ideas, Ability to demonstrate self-control will improve, Ability to identify and develop effective coping behaviors will improve, Ability to maintain clinical measurements within normal limits will improve, and Compliance with prescribed medications will improve   Diagnoses Principal Problem:   Schizoaffective disorder, bipolar type (HCC) Active Problems:   Borderline personality disorder (HCC)   Asperger syndrome   PTSD (post-traumatic stress disorder)   Asthma   Self-injurious behavior   Fibromyalgia   Medications -Discontinued Abilify 2 mg 3 times daily, so as to minimize amount of antipsychotic medications to one only. -Continue Zyprexa 2.5 mg at 12 PM and at 5 PM, decrease at  bedtime 15 mg to 10 mg nightly for psychosis and mood stabilization  -Continue gabapentin and reduced to 400 mg 3 times daily starting 11/15 to minimize amount of medications the patient is on (previously 600 mg 3 times daily) -Continue Cymbalta 60 mg daily from fibromyalgia and depressive symptoms -Continue Protonix 40 mg daily for GERD -Continue prazosin 1 mg nightly for nightmares -Continue propafenone to 25 mg twice daily for cardiac arrhythmia -Continue agitation protocol medications as per the MAR     Other PRNS -Continue Tylenol 650 mg every 6 hours PRN for mild pain -Continue Maalox 30 mg every 4 hrs PRN for indigestion -Continue Milk of Magnesia as needed every 6 hrs for constipation   Labs reviewed: QTc is 513, repeat EKG on 11/16.   Discharge Planning: Social work and case management to assist with discharge planning and identification of hospital follow-up needs prior to discharge Estimated LOS: 5-7 days Discharge Concerns: Need to establish a safety plan; Medication compliance and effectiveness Discharge Goals: Return home with outpatient referrals for mental health follow-up including medication management/psychotherapy   I certify that inpatient services furnished can reasonably be expected to improve the patient's condition.    Golda Acre, MD 12/22/2022, 12:02 PM

## 2022-12-22 NOTE — Group Note (Signed)
Date:  12/22/2022 Time:  12:33 PM  Group Topic/Focus:  Personal Choices and Values:   The focus of this group is to help patients assess and explore the importance of values in their lives, how their values affect their decisions, how they express their values and what opposes their expression.    Participation Level:  Active  [name] 12/22/2022, 12:33 PM

## 2022-12-22 NOTE — Progress Notes (Signed)
[name], is ambulatory in hallway and up to med room taking scheduled meds. Denies SI/HI/AVH and wants to be discharged today. She has a  flat effect and appears to be responding to auditory hallucinations. She contracts verbally for safety Vital signs are stabile and voices no complaint currently

## 2022-12-22 NOTE — Group Note (Signed)
Date:  12/22/2022 Time:  9:06 PM  Group Topic/Focus:  Wrap-Up Group:   The focus of this group is to help patients review their daily goal of treatment and discuss progress on daily workbooks.    Participation Level:  Did Not Attend  Participation Quality:   N/A  Affect:   N/A  Cognitive:   N/A  Insight: None  Engagement in Group:   N/A  Modes of Intervention:   N/A  Additional Comments:  Patient did not attend wrap up group.  Kennieth Francois 12/22/2022, 9:06 PM
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
## 2022-12-23 ENCOUNTER — Encounter (HOSPITAL_COMMUNITY): Payer: Self-pay | Admitting: Psychiatry

## 2022-12-23 DIAGNOSIS — R9431 Abnormal electrocardiogram [ECG] [EKG]: Secondary | ICD-10-CM | POA: Insufficient documentation

## 2022-12-23 MED ORDER — WHITE PETROLATUM EX OINT
TOPICAL_OINTMENT | CUTANEOUS | Status: AC
  Administered 2022-12-23: 1
  Filled 2022-12-23: qty 5

## 2022-12-23 MED ORDER — OLANZAPINE 5 MG PO TABS
5.0000 mg | ORAL_TABLET | Freq: Once | ORAL | Status: AC
  Administered 2022-12-23: 5 mg via ORAL
  Filled 2022-12-23 (×2): qty 1

## 2022-12-23 MED ORDER — GABAPENTIN 100 MG PO CAPS
ORAL_CAPSULE | ORAL | Status: AC
  Filled 2022-12-23: qty 1

## 2022-12-23 MED ORDER — OLANZAPINE 7.5 MG PO TABS
15.0000 mg | ORAL_TABLET | Freq: Every day | ORAL | Status: DC
  Administered 2022-12-23 – 2022-12-28 (×7): 15 mg via ORAL
  Filled 2022-12-23: qty 6
  Filled 2022-12-23 (×4): qty 2
  Filled 2022-12-23: qty 6
  Filled 2022-12-23 (×3): qty 2

## 2022-12-23 MED ORDER — GABAPENTIN 400 MG PO CAPS
400.0000 mg | ORAL_CAPSULE | Freq: Three times a day (TID) | ORAL | Status: DC
  Administered 2022-12-23 – 2022-12-27 (×12): 400 mg via ORAL
  Filled 2022-12-23 (×16): qty 1

## 2022-12-23 NOTE — BHH Group Notes (Signed)
Type of Therapy and Topic:  Group Therapy: Gratitude  Participation Level:  Did Not Attend   Description of Group:   In this group, the patient participates in deep breathing exercise to help quiet down the room and memory game to get to know one another. Patients shared and discussed the importance of acknowledging the elements in their lives for which they are grateful and how this can positively impact their mood.  The group discussed how bringing the positive elements of their lives to the forefront of their minds can help bring positive impact to the physical and mental health.  An exercise was done as a group in which a list was made of gratitude items in order to encourage participants to consider other potential positives in their lives.  Therapeutic Goals: Patients will identify one or more item for which they are grateful in each of 6 categories:  people, experiences, things, places, skills, and other. Patients will discuss how it is possible to seek out gratitude in even bad situations. Patients will explore other possible items of gratitude that they could remember.   Summary of Patient Progress:  NA  Therapeutic Modalities:   Solution-Focused Therapy Activity

## 2022-12-23 NOTE — BHH Group Notes (Signed)
The topic for discussion in today's wrap-up group is, "What are you willing to hold yourself accountable for?" In order to preserve a favorable outcome, avoid exhaustion or overthinking, and stay positive and optimistic in their own rehabilitation, the patient attended  group and shared their deep concepts and therapeutic techniques they had learn throughout program.  (Pt shortly left group)

## 2022-12-23 NOTE — Progress Notes (Signed)
Patient stated she talked to MD about colace.

## 2022-12-23 NOTE — Progress Notes (Signed)
   12/23/22 0645  15 Minute Checks  Location Bedroom  Visual Appearance Calm  Behavior Sleeping  Sleep (Behavioral Health Patients Only)  Calculate sleep? (Click Yes once per 24 hr at 0600 safety check) Yes  Documented sleep last 24 hours 6

## 2022-12-23 NOTE — Progress Notes (Signed)
Patient placed on 1:1 due to making suicidal statements- unable to contract for safety.

## 2022-12-23 NOTE — Progress Notes (Signed)
EKG completed and given to MD for review.  

## 2022-12-23 NOTE — Progress Notes (Signed)
D:  Patient's self inventory sheet, patient sleeps good, sleep medication helpful.  Fair appetite, low energy level, good concentration.  Rated depression 10, hopeless 3, anxiety 6.  Denied withdrawals.  SI, contracts for safety.  Physical pain #3.   Goal is "stay safe".  Does have discharge plans. A:  Medications administered per MD orders.  Emotional support and encouragement given patient. R:  Denied A/V hallucinations.  SI this morning, contracts for safety.  HI to her parents.  Safety maintained with 15 minute checks.   Patient has been in bed most of the morning.  Patient did go to dining room for lunch.

## 2022-12-23 NOTE — Progress Notes (Signed)
[name] MD Progress Note  12/23/2022 11:15 AM [name]  MRN:  [id number]  HPI: [name]. Dukette is a 32 yo Caucasian female who is very familiar to the [facility] behavioral health service line with multiple hospitalizations over the past few months.  She is yet again admitted at this hospital voluntarily on 11/13 after she presented to the [facility] behavioral health urgent care Center initially via [employer] on 11/08 with complaints of SI with a plan to overdose on pills.  Patient was transferred for mental status.   24-hour chart review: Patient is compliant with scheduled medications. Agitation protocol medications given to pt over the past 24 hours.  Overnight, the patient was transferred to the ED after eating Styrofoam, the tip of a pencil, and drinking shampoo.  Patient assessment note: The patient presents is anxious on exam today.  She is spouses paranoid delusions about her parents being behind the nurses station and trying to control her life.  She states that they are trying to "take over everything".  She states "my mom wanting to speak to me".  It is unclear if this is an actual delusion or attention seeking behavior.  I challenged the delusion by offering to take her behind the nurses station to verify that her parents are not there.  She declined stating "I do not care".  Lab results and EKG results were discussed.  EKG was ordered yesterday, but has not been performed yet.  Will follow EKG results carefully and consider decreasing propafenone if QTc remains greater than 500.  The patient denies suicidal or homicidal ideation.  She denies auditory or visual hallucinations.  She reports some cracking on her heel and we discussed applying lanolin.  Otherwise, we will continue treatment plan as prescribed.  Principal Problem: Schizoaffective disorder, bipolar type (HCC) Diagnosis: Principal Problem:   Schizoaffective disorder, bipolar type (HCC) Active Problems:   Borderline  personality disorder (HCC)   Asperger syndrome   PTSD (post-traumatic stress disorder)   Asthma   Self-injurious behavior   Fibromyalgia  Total Time spent with patient: 35 Minutes  Past Psychiatric History: See H & P  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    Chronic constipation    Depression    Drug-seeking behavior    Essential tremor    Headache    Ineffective individual coping 05/16/2022   Insomnia 01/12/2022   Intentional drug overdose (HCC) 06/05/2022   Neuromuscular disorder (HCC)    Normocytic anemia 06/05/2022   Overdose 07/22/2017   Overdose of acetaminophen 07/2017   and other meds   Overdose, intentional self-harm, initial encounter (HCC) 07/20/2021   Paranoia (HCC) 04/22/2021   Personality disorder (HCC)    Purposeful non-suicidal drug ingestion (HCC) 06/27/2021   Schizo-affective psychosis (HCC)    Schizoaffective disorder (HCC) 07/29/2022   Schizoaffective disorder, bipolar type (HCC)    Seizures (HCC)    Last seizure December 2017   Skin erythema 04/27/2022   Sleep apnea    Suicidal behavior 07/25/2021   Suicidal ideation    Suicide (HCC) 07/01/2021   Suicide attempt (HCC) 07/04/2021  Past Surgical History:  Procedure Laterality Date   MOUTH SURGERY  2009 or 2010   Family History:  Family History  Problem Relation Age of Onset   Mental illness Father    Asthma Father    PDD Brother    Seizures Brother    Family Psychiatric  History: See H & P Social History:  Social History   Substance and Sexual Activity  Alcohol Use No   Alcohol/week: 1.0 standard drink of alcohol   Types: 1 Standard drinks or  equivalent per week   Comment: denies at this time     Social History   Substance and Sexual Activity  Drug Use No   Comment: History of cocaine use at age 78 for 4 months    Social History   Socioeconomic History   Marital status: Widowed    Spouse name: Not on file   Number of children: 0   Years of education: Not on file   Highest education level: Not on file  Occupational History   Occupation: disability  Tobacco Use   Smoking status: Former    Types: Cigarettes   Smokeless tobacco: Never   Tobacco comments:    Smoked for 2  years age [age]  Vaping Use   Vaping status: Never Used  Substance and Sexual Activity   Alcohol use: No    Alcohol/week: 1.0 standard drink of alcohol    Types: 1 Standard drinks or equivalent per week    Comment: denies at this time   Drug use: No    Comment: History of cocaine use at age 58 for 4 months   Sexual activity: Not Currently    Birth control/protection: None  Other Topics Concern   Not on file  Social History Narrative   Marital status: Widowed      Children: daughter      Lives: with boyfriend, in two story home      Employment:  Disability      Tobacco: quit smoking; smoked for two years.      Alcohol ;none      Drugs: none   Has not traveled outside of the country.   Right handed         Patient with hx of Fibromyalgia,Orthostatic Tachycardia,Asthma,Arthritis,Gerd, ASD   Social Determinants of Health   Financial Resource Strain: Not on file  Food Insecurity: No Food Insecurity (12/19/2022)   Hunger Vital Sign    Worried About Running Out of Food in the Last Year: Never true    Ran Out of Food in the Last Year: Never true  Transportation Needs: No Transportation Needs (12/19/2022)   PRAPARE - Administrator, Civil Service (Medical): No    Lack of Transportation (Non-Medical): No  Physical Activity: Not on file  Stress: Not on file  Social Connections: Not on file   Sleep: Good  Appetite:   Fair  Current Medications: Current Facility-Administered Medications  Medication Dose Route Frequency Provider Last Rate Last Admin   albuterol (PROVENTIL) (2.5 MG/3ML) 0.083% nebulizer solution 2.5 mg  2.5 mg Nebulization Q6H PRN Weber, Kyra A, NP       alum & mag hydroxide-simeth (MAALOX/MYLANTA) 200-200-20 MG/5ML suspension 30 mL  30 mL Oral Q4H PRN Weber, Bella Kennedy A, NP       bacitracin ointment   Topical BID Starleen Blue, NP   31.5556 Application at 12/23/22 0900   diphenhydrAMINE (BENADRYL) capsule 50 mg  50 mg Oral TID PRN Thomes Lolling, NP  50 mg at 12/22/22 2038   Or   diphenhydrAMINE (BENADRYL) injection 50 mg  50 mg Intramuscular TID PRN Weber, Bella Kennedy A, NP       DULoxetine (CYMBALTA) DR capsule 60 mg  60 mg Oral Daily Weber, Kyra A, NP   60 mg at 12/23/22 0900   gabapentin (NEURONTIN) capsule 600 mg  600 mg Oral TID Starleen Blue, NP   600 mg at 12/23/22 0900   hydrOXYzine (ATARAX) tablet 25 mg  25 mg Oral TID Phebe Colla A, NP   25 mg at 12/23/22 0900   ibuprofen (ADVIL) tablet 600 mg  600 mg Oral Q8H PRN Weber, Kyra A, NP   600 mg at 12/22/22 1257   LORazepam (ATIVAN) tablet 2 mg  2 mg Oral TID PRN Phebe Colla A, NP   2 mg at 12/22/22 2038   Or   LORazepam (ATIVAN) injection 2 mg  2 mg Intramuscular TID PRN Weber, Kyra A, NP       magnesium hydroxide (MILK OF MAGNESIA) suspension 30 mL  30 mL Oral Daily PRN Weber, Kyra A, NP   30 mL at 12/21/22 1253   mometasone-formoterol (DULERA) 200-5 MCG/ACT inhaler 2 puff  2 puff Inhalation BID Ajibola, Ene A, NP   2 puff at 12/23/22 0900   OLANZapine (ZYPREXA) tablet 15 mg  15 mg Oral QHS Golda Acre, MD       OLANZapine [facility]) tablet 2.5 mg  2.5 mg Oral BID Starleen Blue, NP   2.5 mg at 12/22/22 1637   ondansetron (ZOFRAN-ODT) disintegrating tablet 4 mg  4 mg Oral Q8H PRN Weber, Kyra A, NP   4 mg at 12/22/22 1838   pantoprazole (PROTONIX) EC tablet 40 mg  40 mg Oral QAC breakfast Weber, Kyra A, NP   40 mg at 12/23/22 0900   prazosin  (MINIPRESS) capsule 1 mg  1 mg Oral QHS Weber, Kyra A, NP   1 mg at 12/22/22 2318   propafenone (RYTHMOL SR) 12 hr capsule 225 mg  225 mg Oral BID Weber, Kyra A, NP   225 mg at 12/23/22 0900    Lab Results: No results found for this or any previous visit (from the past 48 hour(s)).  Blood Alcohol level:  Lab Results  Component Value Date   ETH <10 12/18/2022   ETH <10 12/14/2022    Metabolic Disorder Labs: Lab Results  Component Value Date   HGBA1C 5.4 10/22/2022   MPG 108 10/22/2022   MPG 103 04/20/2022   Lab Results  Component Value Date   PROLACTIN 20.0 12/14/2022   PROLACTIN 66.2 (H) 03/02/2021   Lab Results  Component Value Date   CHOL 188 12/14/2022   TRIG 222 (H) 12/14/2022   HDL 41 12/14/2022   CHOLHDL 4.6 12/14/2022   VLDL 44 (H) 12/14/2022   LDLCALC 103 (H) 12/14/2022   LDLCALC 117 (H) 10/22/2022    Physical Findings: AIMS:  , ,  ,  ,    CIWA:    COWS:     Musculoskeletal: Strength & Muscle Tone: within normal limits Gait & Station: normal Patient leans: N/A  Psychiatric Specialty Exam:  Presentation  General Appearance:  Disheveled  Eye Contact: Fair  Speech: Clear and Coherent  Speech Volume: Increased  Handedness: Right   Mood and Affect  Mood: Anxious; Irritable  Affect: Restricted   Thought Process  Thought Processes: Linear  Descriptions of Associations:Intact  Orientation:Full (Time, Place and Person)  Thought Content:Illogical  History of  Schizophrenia/Schizoaffective disorder:Yes  Duration of Psychotic Symptoms:Greater than six months  Hallucinations:Hallucinations: None  Ideas of Reference:None  Suicidal Thoughts:Suicidal Thoughts: No  Homicidal Thoughts:Homicidal Thoughts: No   Sensorium  Memory: Immediate Good; Recent Good  Judgment: Poor  Insight: Poor   Executive Functions  Concentration: Good  Attention Span: Good  Recall: Good  Fund of  Knowledge: Good  Language: Good   Psychomotor Activity  Psychomotor Activity: Psychomotor Activity: Normal   Assets  Assets: Communication Skills; Housing   Sleep  Sleep: Sleep: Fair Number of Hours of Sleep: 9    Physical Exam: General: Sitting comfortably. NAD. HEENT: Normocephalic, atraumatic, MMM, EMOI Lungs: no increased work of breathing noted Heart: no cyanosis Abdomen: Non distended Musculoskeletal: FROM. No obvious deformities Skin: Warm, dry, intact. No rashes noted Neuro: No obvious focal deficits.  Gait and station are normal  Review of Systems  Constitutional: Negative.   HENT: Negative.    Eyes: Negative.   Respiratory: Negative.    Cardiovascular: Negative.   Gastrointestinal: Negative.   Genitourinary: Negative.   Skin: Negative.   Neurological: Negative.   Psychiatric/Behavioral:  Positive for anxiety, delusions.     Blood pressure 121/65, pulse (!) 104, temperature 98.4 F (36.9 C), resp. rate 18, height 5\' 4"  (1.626 m), weight 112.5 kg, SpO2 98%. Body mass index is 42.57 kg/m.  Treatment Plan Summary: Daily contact with patient to assess and evaluate symptoms and progress in treatment and Medication management   Safety and Monitoring: Voluntary admission to inpatient psychiatric unit for safety, stabilization and treatment Daily contact with patient to assess and evaluate symptoms and progress in treatment Patient's case to be discussed in multi-disciplinary team meeting Observation Level : q15 minute checks Vital signs: q12 hours Precautions: Safety   Long Term Goal(s): Improvement in symptoms so as ready for discharge   Short Term Goals: Ability to identify changes in lifestyle to reduce recurrence of condition will improve, Ability to verbalize feelings will improve, Ability to disclose and discuss suicidal ideas, Ability to demonstrate self-control will improve, Ability to identify and develop effective coping behaviors will  improve, Ability to maintain clinical measurements within normal limits will improve, and Compliance with prescribed medications will improve   Diagnoses Principal Problem:   Schizoaffective disorder, bipolar type (HCC) Active Problems:   Borderline personality disorder (HCC)   Asperger syndrome   PTSD (post-traumatic stress disorder)   Asthma   Self-injurious behavior   Fibromyalgia   Medications -Discontinued Abilify 2 mg 3 times daily, so as to minimize amount of antipsychotic medications to one only. -Continue Zyprexa 2.5 mg at 12 PM and at 5 PM, decrease at bedtime 15 mg to 10 mg nightly for psychosis and mood stabilization  -Continue gabapentin and reduced to 400 mg 3 times daily starting 11/15 to minimize amount of medications the patient is on (previously 600 mg 3 times daily) -Continue Cymbalta 60 mg daily from fibromyalgia and depressive symptoms -Continue Protonix 40 mg daily for GERD -Continue prazosin 1 mg nightly for nightmares -Continue propafenone to 225 mg twice daily for cardiac arrhythmia -Continue agitation protocol medications as per the MAR     Other PRNS -Continue Tylenol 650 mg every 6 hours PRN for mild pain -Continue Maalox 30 mg every 4 hrs PRN for indigestion -Continue Milk of Magnesia as needed every 6 hrs for constipation   Labs reviewed: QTc is 513, repeat EKG on 11/17.   Discharge Planning: Social work and case management to assist with discharge planning and identification of hospital  follow-up needs prior to discharge Estimated LOS: 5-7 days Discharge Concerns: Need to establish a safety plan; Medication compliance and effectiveness Discharge Goals: Return home with outpatient referrals for mental health follow-up including medication management/psychotherapy   I certify that inpatient services furnished can reasonably be expected to improve the patient's condition.    Golda Acre, MD 12/23/2022, 11:15 AM

## 2022-12-23 NOTE — Plan of Care (Signed)
  Problem: Education: Goal: Knowledge of [facility] Education information/materials will improve Outcome: Progressing   Problem: Education: Goal: Verbalization of understanding the information provided will improve Outcome: Progressing   Problem: Coping: Goal: Ability to verbalize frustrations and anger appropriately will improve Outcome: Progressing   Problem: Coping: Goal: Ability to demonstrate self-control will improve Outcome: Progressing   Problem: Physical Regulation: Goal: Ability to maintain clinical measurements within normal limits will improve Outcome: Progressing

## 2022-12-23 NOTE — Progress Notes (Signed)
Patient stated she talked to her parents last night when they visited her.  Patient stated she does feel SI and HI to her parents this morning.  Denied hearing voices or seeing things.  Patient contracts for safety.

## 2022-12-23 NOTE — Progress Notes (Signed)
   12/22/22 2125  Psych Admission Type (Psych Patients Only)  Admission Status Voluntary  Psychosocial Assessment  Patient Complaints Agitation;Crying spells;Irritability;Anger;Self-harm behaviors  Eye Contact Fair  Facial Expression Flat  Affect Anxious;Irritable;Preoccupied;Sad  Speech Argumentative;Aggressive  Interaction Attention-seeking  Motor Activity Fidgety  Appearance/Hygiene Unremarkable  Behavior Characteristics Agitated;Anxious;Combative;Irritable  Mood Anxious  Thought Process  Coherency WDL  Content WDL  Delusions Paranoid  Perception Hallucinations  Hallucination Visual;Auditory  Judgment Impaired  Confusion None  Danger to Self  Current suicidal ideation? Verbalizes  Agreement Not to Harm Self Yes  Description of Agreement Verbal  Danger to Others  Danger to Others None reported or observed

## 2022-12-23 NOTE — Progress Notes (Addendum)
1:1 Note 12/23/2022  1900 Patient continues with 1:1 per MD order.  Patient has talked about her bothers, one has autism and she believes her mother has autism also.  Patient continues to believe that her parents are in the [facility] building.  Several staff members have talked to patient about this.  Patient continues to say that she is SI and HI to her parents.  Denied A/V hallucinations.  Denied pain.  Has had several small BMs this afternoon.  Patient was given wash cloths to hold in her hands.  New sheet placed on her bed.  Patent showered after dinner.  Patient stated that she does not want to take agitation medicine at this time.  She stated she does want to be discharged soon so she can see her 34 yr old daughter.  1:1 continues for safety.

## 2022-12-23 NOTE — Progress Notes (Signed)
Patient returned to the unit  after being sent out to the ED for medical clearance. Per report from ED nurse, patient was on observation, PO fluids and her bedtime medication administered without any issues. Patient alert and oriented x3. VS  T 98.4, P 113, R 18, BP 140/97, O2 Sat 99% RA. Patient remains calm and cooperative. Kept safe, Q 15 minutes safety checks maintained.

## 2022-12-23 NOTE — Plan of Care (Signed)
Nurse discussed anxiety, depression and coping skills with patient.  
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
## 2022-12-23 NOTE — Progress Notes (Signed)
1:1 Note 12/23/2022   4:45 pm Patient stated she was having SI and HI thoughts to hurt her parents.  Feels that her parents are here in the [facility] building.  Several staff members have talked to patient, that her parents are not here in this building, and she does not need to be upset about parents.  Patient denied A/V hallucinations.  Denied pain.  Respirations even and unlabored.  No signs/ symptoms of pain/distress noted on patient's face/body movements.  Patient agreed to eat dinner with 1:1 and we will talk after dinner about her feelings that parents are here to hurt her.  Patient agreed and does not want to take medication for agitation at this time.  Charge nurse informed.
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
## 2022-12-23 NOTE — [facility] Group Notes (Signed)
[facility] Group Notes:  (Nursing)  Date:  12/23/2022  Time:  1330  Type of Therapy:  Nurse Education  Participation Level:  Did Not Attend   Shela Nevin 12/23/2022, 5:55 PM

## 2022-12-24 DIAGNOSIS — F250 Schizoaffective disorder, bipolar type: Secondary | ICD-10-CM | POA: Diagnosis not present

## 2022-12-24 MED ORDER — MELATONIN 3 MG PO TABS
3.0000 mg | ORAL_TABLET | Freq: Every day | ORAL | Status: DC
  Administered 2022-12-24 – 2023-01-14 (×22): 3 mg via ORAL
  Filled 2022-12-24 (×3): qty 1
  Filled 2022-12-24: qty 2
  Filled 2022-12-24: qty 1
  Filled 2022-12-24: qty 2
  Filled 2022-12-24 (×5): qty 1
  Filled 2022-12-24: qty 2
  Filled 2022-12-24: qty 1
  Filled 2022-12-24 (×4): qty 2
  Filled 2022-12-24: qty 1
  Filled 2022-12-24 (×2): qty 2
  Filled 2022-12-24: qty 1
  Filled 2022-12-24 (×2): qty 2
  Filled 2022-12-24 (×5): qty 1

## 2022-12-24 MED ORDER — HYDROXYZINE HCL 25 MG PO TABS
25.0000 mg | ORAL_TABLET | Freq: Three times a day (TID) | ORAL | Status: DC | PRN
  Administered 2022-12-24 – 2023-01-14 (×30): 25 mg via ORAL
  Filled 2022-12-24 (×10): qty 1
  Filled 2022-12-24: qty 4
  Filled 2022-12-24 (×18): qty 1

## 2022-12-24 NOTE — Progress Notes (Signed)
1:1 Note:  Patient in her room. Endorsing passive SI and HI against her parents. Patient remains under 1:1 supervision. Patient remains safe at this time.

## 2022-12-24 NOTE — Plan of Care (Signed)
  Problem: Education: Goal: Emotional status will improve Outcome: Not Progressing Goal: Mental status will improve Outcome: Not Progressing   

## 2022-12-24 NOTE — Progress Notes (Signed)
[facility] MD Progress Note  12/24/2022 5:03 PM [name]  MRN:  [id number]  HPI: [name]. [place] is a 32 yo Caucasian female who is very familiar to the [facility] behavioral health service line with multiple hospitalizations over the past few months.  She is yet again admitted at this hospital voluntarily on 11/13 after she presented to the [facility] behavioral health urgent care Center initially via [employer] on 11/08 with complaints of SI with a plan to overdose on pills.  Patient was transferred for mental status.   24-hour chart review: Heart rate slightly elevated today morning at 103, but rest of V/S WNL.  Patient is compliant with scheduled medications. Last required agitation protocol medications on 11/16 as per the mar.  Maintained on one-on-one staff monitoring for the past 24 hours, as she would not contract for safety on the unit.  Slept for 2.5 hours last night as per nursing reports and documentation.  Patient assessment note: During encounter today, patient continues to endorse SI, states her plan is to "eat anything I can", and she is not able to contract for safety on the unit at this time, requiring for one-on-one staff monitoring to continue until she is able to verbally contract to keep herself safe on the unit.  Patient is also endorsing HI towards her parents, states if discharged, she will go to their home and kill them both, and kill herself.  She presents with delusions of persecution, continues to state that parents are here on the unit, and are out to hurt her.  She talks about missing her child, states that she wants to starve herself to death, talks about her deceased husband, talks about the various anniversaries, such as the death anniversary of her husband, the birth of her child, and when she met her husband, all around the same time.    She complains of poor sleep last night, complains of generalized body pains, rates her depression a 10, with 10 being worse.  Rates  anxiety an 8, 10 being worst.  On patient's wall is written in pencil "[employer]. I hate you."  Patient is asked to who she wrote this, and states to her mother.  She talks about eating a Styrofoam cup a few days ago, drinking baby shampoo, and eating a pencil, rendering her to be sent to the hospital, and states that she will do it again, if she had a chance to.  We will therefore keep patient on one-on-one staff monitoring at this time, until she is able to contract for safety on the unit.  Historically, we have made attempts to minimize the amount of medications that patient is on, due to a history of impulsive overdosing, which have landed her in the ICU with long stays.  We will therefore not make any changes to her medications today, and we will keep medications same as listed below, and add Melatonin 3 mg nightly for sleep. We will continue to monitor.  Principal Problem: Schizoaffective disorder, bipolar type (HCC) Diagnosis: Principal Problem:   Schizoaffective disorder, bipolar type (HCC) Active Problems:   Borderline personality disorder (HCC)   Asperger syndrome   PTSD (post-traumatic stress disorder)   Asthma   Self-injurious behavior   Fibromyalgia   Prolonged QT interval  Total Time spent with patient: 45 minutes  Past Psychiatric History: See H & P  Past Medical History:  Past Medical History:  Diagnosis Date   Acid reflux    Adjustment disorder with mixed anxiety  and depressed mood 01/31/2022   Adjustment disorder with mixed disturbance of emotions and conduct 08/03/2019   Anxiety    Asthma    last attack 03/13/15 or 03/14/15   Autism    Bipolar 1 disorder, depressed, severe (HCC) 07/25/2021   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 fragile X syndrome    Chronic constipation    Depression    Drug-seeking behavior    Essential tremor    Headache    Ineffective individual coping 05/16/2022   Insomnia 01/12/2022   Intentional drug overdose (HCC) 06/05/2022   Neuromuscular disorder (HCC)     Normocytic anemia 06/05/2022   Overdose 07/22/2017   Overdose of acetaminophen 07/2017   and other meds   Overdose, intentional self-harm, initial encounter (HCC) 07/20/2021   Paranoia (HCC) 04/22/2021   Personality disorder (HCC)    Prolonged QT interval    Purposeful non-suicidal drug ingestion (HCC) 06/27/2021   Schizo-affective psychosis (HCC)    Schizoaffective disorder (HCC) 07/29/2022   Schizoaffective disorder, bipolar type (HCC)    Seizures (HCC)    Last seizure December 2017   Skin erythema 04/27/2022   Sleep apnea    Suicidal behavior 07/25/2021   Suicidal ideation    Suicide (HCC) 07/01/2021   Suicide attempt (HCC) 07/04/2021    Past Surgical History:  Procedure Laterality Date   MOUTH SURGERY  2009 or 2010   Family History:  Family History  Problem Relation Age of Onset   Mental illness Father    Asthma Father    PDD Brother    Seizures Brother    Family Psychiatric  History: See H & P Social History:  Social History   Substance and Sexual Activity  Alcohol Use No   Alcohol/week: 1.0 standard drink of alcohol   Types: 1 Standard drinks or equivalent per week   Comment: denies at this time     Social History   Substance and Sexual Activity  Drug Use No   Comment: History of cocaine use at age 66 for 4 months    Social History   Socioeconomic History   Marital status: Widowed    Spouse name: Not on file   Number of children: 0   Years of education: Not on file   Highest education level: Not on file  Occupational History   Occupation: disability  Tobacco Use   Smoking status: Former    Types: Cigarettes   Smokeless tobacco: Never   Tobacco comments:    Smoked for 2  years age [age]  Vaping Use   Vaping status: Never Used  Substance and Sexual Activity   Alcohol use: No    Alcohol/week: 1.0 standard drink of alcohol    Types: 1 Standard drinks or equivalent per week    Comment: denies at this time   Drug use: No    Comment: History  of cocaine use at age 56 for 4 months   Sexual activity: Not Currently    Birth control/protection: None  Other Topics Concern   Not on file  Social History Narrative   Marital status: Widowed      Children: daughter      Lives: with boyfriend, in two story home      Employment:  Disability      Tobacco: quit smoking; smoked for two years.      Alcohol ;none      Drugs: none   Has not traveled outside of the country.   Right handed  Patient with hx of Fibromyalgia,Orthostatic Tachycardia,Asthma,Arthritis,Gerd, ASD   Social Determinants of Health   Financial Resource Strain: Not on file  Food Insecurity: No Food Insecurity (12/19/2022)   Hunger Vital Sign    Worried About Running Out of Food in the Last Year: Never true    Ran Out of Food in the Last Year: Never true  Transportation Needs: No Transportation Needs (12/19/2022)   PRAPARE - Administrator, Civil Service (Medical): No    Lack of Transportation (Non-Medical): No  Physical Activity: Not on file  Stress: Not on file  Social Connections: Not on file   Sleep: Good  Appetite:  Fair  Current Medications: Current Facility-Administered Medications  Medication Dose Route Frequency Provider Last Rate Last Admin   albuterol (PROVENTIL) (2.5 MG/3ML) 0.083% nebulizer solution 2.5 mg  2.5 mg Nebulization Q6H PRN Weber, Kyra A, NP       alum & mag hydroxide-simeth (MAALOX/MYLANTA) 200-200-20 MG/5ML suspension 30 mL  30 mL Oral Q4H PRN Weber, Bella Kennedy A, NP       bacitracin ointment   Topical BID Starleen Blue, NP   [phone] Application at 12/24/22 0804   diphenhydrAMINE (BENADRYL) capsule 50 mg  50 mg Oral TID PRN Phebe Colla A, NP   50 mg at 12/22/22 2038   Or   diphenhydrAMINE (BENADRYL) injection 50 mg  50 mg Intramuscular TID PRN Weber, Kyra A, NP       DULoxetine (CYMBALTA) DR capsule 60 mg  60 mg Oral Daily Weber, Kyra A, NP   60 mg at 12/24/22 0805   gabapentin (NEURONTIN) capsule 400 mg  400 mg Oral  TID Golda Acre, MD   400 mg at 12/24/22 1415   hydrOXYzine (ATARAX) tablet 25 mg  25 mg Oral TID Phebe Colla A, NP   25 mg at 12/24/22 1645   ibuprofen (ADVIL) tablet 600 mg  600 mg Oral Q8H PRN Weber, Kyra A, NP   600 mg at 12/24/22 0454   LORazepam (ATIVAN) tablet 2 mg  2 mg Oral TID PRN Phebe Colla A, NP   2 mg at 12/22/22 2038   Or   LORazepam (ATIVAN) injection 2 mg  2 mg Intramuscular TID PRN Weber, Kyra A, NP       magnesium hydroxide (MILK OF MAGNESIA) suspension 30 mL  30 mL Oral Daily PRN Weber, Kyra A, NP   30 mL at 12/23/22 1352   mometasone-formoterol (DULERA) 200-5 MCG/ACT inhaler 2 puff  2 puff Inhalation BID Ajibola, Ene A, NP   2 puff at 12/24/22 1646   OLANZapine (ZYPREXA) tablet 15 mg  15 mg Oral QHS Golda Acre, MD   15 mg at 12/23/22 1957   OLANZapine (ZYPREXA) tablet 2.5 mg  2.5 mg Oral BID Starleen Blue, NP   2.5 mg at 12/24/22 1646   ondansetron (ZOFRAN-ODT) disintegrating tablet 4 mg  4 mg Oral Q8H PRN Weber, Kyra A, NP   4 mg at 12/23/22 1248   pantoprazole (PROTONIX) EC tablet 40 mg  40 mg Oral QAC breakfast Weber, Kyra A, NP   40 mg at 12/24/22 0454   prazosin (MINIPRESS) capsule 1 mg  1 mg Oral QHS Weber, Kyra A, NP   1 mg at 12/23/22 2040   propafenone (RYTHMOL SR) 12 hr capsule 225 mg  225 mg Oral BID Weber, Kyra A, NP   225 mg at 12/24/22 1646    Lab Results: No results found for this or any previous visit (  from the past 48 hour(s)).  Blood Alcohol level:  Lab Results  Component Value Date   ETH <10 12/18/2022   ETH <10 12/14/2022    Metabolic Disorder Labs: Lab Results  Component Value Date   HGBA1C 5.4 10/22/2022   MPG 108 10/22/2022   MPG 103 04/20/2022   Lab Results  Component Value Date   PROLACTIN 20.0 12/14/2022   PROLACTIN 66.2 (H) 03/02/2021   Lab Results  Component Value Date   CHOL 188 12/14/2022   TRIG 222 (H) 12/14/2022   HDL 41 12/14/2022   CHOLHDL 4.6 12/14/2022   VLDL 44 (H) 12/14/2022   LDLCALC 103 (H) 12/14/2022    LDLCALC 117 (H) 10/22/2022    Physical Findings: AIMS:  , ,  ,  ,    CIWA:    COWS:     Musculoskeletal: Strength & Muscle Tone: within normal limits Gait & Station: normal Patient leans: N/A  Psychiatric Specialty Exam:  Presentation  General Appearance:  Appropriate for Environment; Fairly Groomed  Eye Contact: Fair  Speech: Clear and Coherent  Speech Volume: Normal  Handedness: Right   Mood and Affect  Mood: Depressed; Anxious  Affect: Congruent   Thought Process  Thought Processes: Coherent  Descriptions of Associations:Intact  Orientation:Full (Time, Place and Person)  Thought Content:Logical  History of Schizophrenia/Schizoaffective disorder:Yes  Duration of Psychotic Symptoms:Greater than six months  Hallucinations:Hallucinations: None  Ideas of Reference:Paranoia; Delusions  Suicidal Thoughts:Suicidal Thoughts: Yes, Active SI Active Intent and/or Plan: With Plan  Homicidal Thoughts:Homicidal Thoughts: Yes, Active HI Active Intent and/or Plan: Without Plan; With Intent   Sensorium  Memory: Immediate Poor  Judgment: Poor  Insight: Poor   Executive Functions  Concentration: Poor  Attention Span: Poor  Recall: Fair  Fund of Knowledge: Poor  Language: Fair   Psychomotor Activity  Psychomotor Activity: Psychomotor Activity: Normal   Assets  Assets: Resilience   Sleep  Sleep: Sleep: Poor    Physical Exam: Physical Exam Constitutional:      Appearance: She is obese.  Musculoskeletal:        General: Normal range of motion.  Neurological:     Mental Status: She is alert and oriented to person, place, and time.    Review of Systems  Psychiatric/Behavioral:  Positive for depression and hallucinations. Negative for memory loss, substance abuse and suicidal ideas. The patient is nervous/anxious and has insomnia.   All other systems reviewed and are negative.  Blood pressure 110/74, pulse (!) 103,  temperature 97.8 F (36.6 C), temperature source Oral, resp. rate 18, height 5\' 4"  (1.626 m), weight 112.5 kg, SpO2 97%. Body mass index is 42.57 kg/m.  Treatment Plan Summary: Daily contact with patient to assess and evaluate symptoms and progress in treatment and Medication management   Safety and Monitoring: Voluntary admission to inpatient psychiatric unit for safety, stabilization and treatment Daily contact with patient to assess and evaluate symptoms and progress in treatment Patient's case to be discussed in multi-disciplinary team meeting Observation Level : q15 minute checks Vital signs: q12 hours Precautions: Safety   Long Term Goal(s): Improvement in symptoms so as ready for discharge   Short Term Goals: Ability to identify changes in lifestyle to reduce recurrence of condition will improve, Ability to verbalize feelings will improve, Ability to disclose and discuss suicidal ideas, Ability to demonstrate self-control will improve, Ability to identify and develop effective coping behaviors will improve, Ability to maintain clinical measurements within normal limits will improve, and Compliance with prescribed medications will  improve   Diagnoses Principal Problem:   Schizoaffective disorder, bipolar type (HCC) Active Problems:   Borderline personality disorder (HCC)   Asperger syndrome   PTSD (post-traumatic stress disorder)   Asthma   Self-injurious behavior   Fibromyalgia   Medications -Start Melatonin 3 mg nightly for sleep -Discontinued Abilify 2 mg 3 times daily on admission, so as to minimize amount of antipsychotic medications to one only. -Continue Zyprexa 2.5 mg at 12 PM and at 5 PM, continue 15 mg nightly for psychosis and mood stabilization -Continue gabapentin 400 mg 3 times daily for GAD -Continue Cymbalta 60 mg daily from fibromyalgia and depressive symptoms -Continue Protonix 40 mg daily for GERD -Continue prazosin 1 mg nightly for nightmares -Continue  propafenone to 25 mg twice daily for cardiac arrhythmia -Continue agitation protocol medications as per the MAR     Other PRNS -Continue Tylenol 650 mg every 6 hours PRN for mild pain -Continue Maalox 30 mg every 4 hrs PRN for indigestion -Continue Milk of Magnesia as needed every 6 hrs for constipation   Labs reviewed: QTc is 513, repeat EKG on 11/19.   Discharge Planning: Social work and case management to assist with discharge planning and identification of hospital follow-up needs prior to discharge Estimated LOS: 5-7 days Discharge Concerns: Need to establish a safety plan; Medication compliance and effectiveness Discharge Goals: Return home with outpatient referrals for mental health follow-up including medication management/psychotherapy   I certify that inpatient services furnished can reasonably be expected to improve the patient's condition.    Starleen Blue, NP 12/24/2022, 5:03 PM Patient ID: [name], female   DOB: 1990-10-23, 32 y.o.   MRN: [id number]

## 2022-12-24 NOTE — Progress Notes (Signed)
   12/24/22 0804  Psych Admission Type (Psych Patients Only)  Admission Status Voluntary  Psychosocial Assessment  Patient Complaints Anxiety;Depression;Hopelessness  Eye Contact Brief  Facial Expression Flat  Affect Depressed  Speech Logical/coherent  Interaction Assertive  Motor Activity Restless  Appearance/Hygiene Unremarkable  Behavior Characteristics Guarded  Mood Depressed  Thought Process  Coherency WDL  Content WDL  Delusions Paranoid  Perception WDL  Hallucination None reported or observed  Judgment Impaired  Confusion None  Danger to Self  Current suicidal ideation? Passive  Description of Suicide Plan No plan  Agreement Not to Harm Self Yes  Description of Agreement Verbal  Danger to Others  Danger to Others Reported or observed  Danger to Others Abnormal  Harmful Behavior to others No threats or harm toward other people  Destructive Behavior No threats or harm toward property

## 2022-12-24 NOTE — [facility] Group Notes (Signed)
Spiritual care group on grief and loss facilitated by Chaplain Katy Climmie Buelow, [facility]  Group Goal: Support / Education around grief and loss  Members engage in facilitated group support and psycho-social education.  Group Description:  Following introductions and group rules, group members engaged in facilitated group dialogue and support around topic of loss, with particular support around experiences of loss in their lives. Group Identified types of loss (relationships / self / things) and identified patterns, circumstances, and changes that precipitate losses. Reflected on thoughts / feelings around loss, normalized grief responses, and recognized variety in grief experience. Group encouraged individual reflection on safe space and on the coping skills that they are already utilizing.  Group drew on Adlerian / Rogerian and narrative framework  Patient Progress: Did not attend.  

## 2022-12-24 NOTE — Progress Notes (Signed)
1:1 Nursing note - Patient has been up and awake in her room. She requested ibuprofen for a headache she rated an 8. She slept 2.5 hours and reports she has a lot on her mind. Safety maintained and 1:1 continues.

## 2022-12-24 NOTE — Plan of Care (Signed)
  Problem: Education: Goal: Emotional status will improve Outcome: Not Progressing Goal: Mental status will improve Outcome: Not Progressing   Problem: Coping: Goal: Ability to verbalize frustrations and anger appropriately will improve Outcome: Not Progressing   

## 2022-12-24 NOTE — Group Note (Signed)
Recreation Therapy Group Note   Group Topic:Stress Management  Group Date: 12/24/2022 Start Time: 0940 End Time: 0955 Facilitators: Shantrice Rodenberg-McCall, LRT,CTRS Location: [address] Dayroom   Group Topic: Stress Management   Goal Area(s) Addresses:  Patient will actively participate in stress management techniques presented during session.  Patient will successfully identify benefit of practicing stress management post d/c.   Behavioral Response: Appropriate  Intervention: Relaxation exercise with ambient sound and script   Group Description: Guided Imagery. LRT provided education, instruction, and demonstration on practice of visualization via guided imagery. Patient was asked to participate in the technique introduced during session. LRT debriefed including topics of mindfulness, stress management and specific scenarios each patient could use these techniques. Patients were given suggestions of ways to access scripts post d/c and encouraged to explore Youtube and other apps available on smartphones, tablets, and computers.  Education: Stress Management, Discharge Planning.   Education Outcome: Acknowledges education   Affect/Mood: N/A   Participation Level: Did not attend    Clinical Observations/Individualized Feedback:     Plan: Continue to engage patient in RT group sessions 2-3x/week.   Jashan Cotten-McCall, LRT,CTRS 12/24/2022 12:16 PM

## 2022-12-24 NOTE — Group Note (Signed)
Date:  12/24/2022 Time:  10:56 AM  Group Topic/Focus:  Wellness Toolbox:   The focus of this group is to discuss various aspects of wellness, balancing those aspects and exploring ways to increase the ability to experience wellness.  Patients will create a wellness toolbox for use upon discharge.    Participation Level:  Did Not Attend  Participation Quality:      Affect:      Cognitive:      Insight: None  Engagement in Group:      Modes of Intervention:      Additional Comments:    Beckie Busing 12/24/2022, 10:56 AM

## 2022-12-24 NOTE — Progress Notes (Signed)
Nursing 1:1 Note:  Patient in her bed sleeping. No distress noted. 1:1 monitoring continues. Patient remains safe at this time.

## 2022-12-24 NOTE — Group Note (Signed)
Date:  12/24/2022 Time:  9:00 AM  Group Topic/Focus:  Goals Group:   The focus of this group is to help patients establish daily goals to achieve during treatment and discuss how the patient can incorporate goal setting into their daily lives to aide in recovery.    Participation Level:  Did Not Attend  Participation Quality:      Affect:      Cognitive:      Insight: None  Engagement in Group:      Modes of Intervention:      Additional Comments:    Beckie Busing 12/24/2022, 9:00 AM

## 2022-12-24 NOTE — Progress Notes (Signed)
1:1 nursing note (late entry) Patient is awake talking to her sitter. She has been labile sitter reporting one conversation she is fine and next conversation she is crying. She is now lying in bed reading a book. 1:1 continues and patient is safe.

## 2022-12-24 NOTE — Progress Notes (Signed)
Nursing 1:1 Note:  Patient stated that she is lightheaded. Vitals assessed. BP 119/70 HR 112. Patient encouraged to eat and hydrate after earlier statements of refusing to eat and drink. Patient administered scheduled medications without incident. 1:1 monitoring continues. Patient remains safe at this time.

## 2022-12-24 NOTE — Progress Notes (Signed)
Nursing 1:1 note D:Pt observed lying in bed with eyes closed. RR even and unlabored. No distress noted. A: 1:1 observation continues for safety  R: pt remains safe  

## 2022-12-24 NOTE — [facility] Suicide Risk Assessment (Signed)
[facility] INPATIENT:  Family/Significant Other Suicide Prevention Education  Suicide Prevention Education:  Education Completed; Dance movement psychotherapist,  ([employer]) has been identified by the patient as the family member/significant other with whom the patient will be residing, and identified as the person(s) who will aid the patient in the event of a mental health crisis (suicidal ideations/suicide attempt).  With written consent from the patient, the family member/significant other has been provided the following suicide prevention education, prior to the and/or following the discharge of the patient.  The suicide prevention education provided includes the following: Suicide risk factors Suicide prevention and interventions National Suicide Hotline telephone number [facility] assessment telephone number [facility] Emergency Assistance 911 [facility] and/or Residential Mobile Crisis Unit telephone number  Request made of family/significant other to: Remove weapons (e.g., guns, rifles, knives), all items previously/currently identified as safety concern.   Remove drugs/medications (over-the-counter, prescriptions, illicit drugs), all items previously/currently identified as a safety concern.   [name] is apart of Patient ACT team.  [name] is unable to confirm if guns or weapons are in the home. [name] has consent to speak to her boyfriend who lives in the home.  [name] will have safety planning discussion with boyfriend to ensure guns, weapons are not available upon discharge.  The family member/significant other verbalizes understanding of the suicide prevention education information provided.  The family member/significant other agrees to remove the items of safety concern listed above.  [name] 12/24/2022, 3:44 PM

## 2022-12-25 DIAGNOSIS — F250 Schizoaffective disorder, bipolar type: Secondary | ICD-10-CM | POA: Diagnosis not present

## 2022-12-25 MED ORDER — METHOCARBAMOL 500 MG PO TABS
500.0000 mg | ORAL_TABLET | Freq: Once | ORAL | Status: AC
  Administered 2022-12-25: 500 mg via ORAL
  Filled 2022-12-25 (×2): qty 1

## 2022-12-25 NOTE — Progress Notes (Signed)
Patient 1:1 staff called for help and this RN went to patient's room to find patient trying to eat soap and lotion. MHT also reports patient had been trying to tie her blanket around her neck. NP came to speak with patient and got her to calm down. NP gave verbal order to give night time zyprexa early. Patient moved to 500 for continued poor behavior.

## 2022-12-25 NOTE — Progress Notes (Signed)
[facility] MD Progress Note  12/25/2022 4:55 PM [name]  MRN:  [id number]  HPI: [name] is a 32 yo Caucasian [name] who is very familiar to the [facility] behavioral health service line with multiple hospitalizations over the past few months.  She is yet again admitted at this hospital voluntarily on 11/13 after she presented to the [facility] behavioral health urgent care [facility] initially via [employer] on 11/08 with complaints of SI with a plan to overdose on pills.  Patient was transferred for mental status.   24-hour chart review: Heart rate slightly elevated today morning at 115, but rest of V/S WNL.  Patient is continuing to be compliant with scheduled medications. Maintained on one-on-one staff monitoring for the past 24 hours. No agitation protocol medications required overnight. Continues to refuse to verbally contract for safety on the unit, requiring continuous staff monitoring.  Patient assessment note: Pt with flat affect and depressed mood, attention to personal hygiene and grooming is fair, eye contact is good, speech is clear & coherent.  Patient however presents with some illogical thought contents, and is irrational, as she states that she thinks her family members are on the unit, and are out to hurt her.  She continues to endorse suicidal ideations, states that if discharged from the hospital, she will walk into traffic and kill herself.  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  Empathy and active listening provided.  Today, she denies AVH.  She is unable to verbally contract for safety at this time, continues to require one-to-one staff monitoring.  We will revisit discharge planning as symptoms began to resolve.  We will continue medications at this time with no changes today.  Patient reports that she was able to  sleep better last night with the addition of the melatonin.  She reports that she has not been eating, because she is trying to starve herself so that she can die, but staff has observed her eating.   Principal Problem: Schizoaffective disorder, bipolar type (HCC) Diagnosis: Principal Problem:   Schizoaffective disorder, bipolar type (HCC) Active Problems:   Borderline personality disorder (HCC)   Asperger syndrome   PTSD (post-traumatic stress disorder)   Asthma   Self-injurious behavior   Fibromyalgia   Prolonged QT interval  Total Time spent with patient: 45 minutes  Past Psychiatric History: See H & P  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    Chronic constipation    Depression    Drug-seeking behavior    Essential tremor    Headache    Ineffective individual coping 05/16/2022   Insomnia 01/12/2022   Intentional drug overdose (HCC) 06/05/2022   Neuromuscular disorder (HCC)    Normocytic anemia 06/05/2022   Overdose 07/22/2017   Overdose of acetaminophen 07/2017   and other meds   Overdose, intentional self-harm, initial encounter (HCC) 07/20/2021   Paranoia (HCC) 04/22/2021   Personality disorder (HCC)  Prolonged QT interval    Purposeful non-suicidal drug ingestion (HCC) 06/27/2021   Schizo-affective psychosis (HCC)    Schizoaffective disorder (HCC) 07/29/2022   Schizoaffective disorder, bipolar type (HCC)    Seizures (HCC)    Last seizure December 2017   Skin erythema 04/27/2022   Sleep apnea    Suicidal behavior 07/25/2021   Suicidal ideation    Suicide (HCC) 07/01/2021   Suicide attempt (HCC) 07/04/2021    Past  Surgical History:  Procedure Laterality Date   MOUTH SURGERY  2009 or 2010   Family History:  Family History  Problem Relation Age of Onset   Mental illness Father    Asthma Father    PDD Brother    Seizures Brother    Family Psychiatric  History: See H & P Social History:  Social History   Substance and Sexual Activity  Alcohol Use No   Alcohol/week: 1.0 standard drink of alcohol   Types: 1 Standard drinks or equivalent per week   Comment: denies at this time     Social History   Substance and Sexual Activity  Drug Use No   Comment: History of cocaine use at age 46 for 4 months    Social History   Socioeconomic History   Marital status: Widowed    Spouse name: Not on file   Number of children: 0   Years of education: Not on file   Highest education level: Not on file  Occupational History   Occupation: disability  Tobacco Use   Smoking status: Former    Types: Cigarettes   Smokeless tobacco: Never   Tobacco comments:    Smoked for 2  years age [age]  Vaping Use   Vaping status: Never Used  Substance and Sexual Activity   Alcohol use: No    Alcohol/week: 1.0 standard drink of alcohol    Types: 1 Standard drinks or equivalent per week    Comment: denies at this time   Drug use: No    Comment: History of cocaine use at age 47 for 4 months   Sexual activity: Not Currently    Birth control/protection: None  Other Topics Concern   Not on file  Social History Narrative   Marital status: Widowed      Children: daughter      Lives: with boyfriend, in two story home      Employment:  Disability      Tobacco: quit smoking; smoked for two years.      Alcohol ;none      Drugs: none   Has not traveled outside of the country.   Right handed         Patient with hx of Fibromyalgia,Orthostatic Tachycardia,Asthma,Arthritis,Gerd, ASD   Social Determinants of Health   Financial Resource Strain: Not on file  Food Insecurity: No Food Insecurity (12/25/2022)    Hunger Vital Sign    Worried About Running Out of Food in the Last Year: Never true    Ran Out of Food in the Last Year: Never true  Transportation Needs: No Transportation Needs (12/25/2022)   PRAPARE - Administrator, Civil Service (Medical): No    Lack of Transportation (Non-Medical): No  Physical Activity: Not on file  Stress: Not on file  Social Connections: Not on file   Sleep: Good  Appetite:  Fair  Current Medications: Current Facility-Administered Medications  Medication Dose Route Frequency Provider Last Rate Last Admin   albuterol (PROVENTIL) (2.5 MG/3ML) 0.083% nebulizer solution 2.5  mg  2.5 mg Nebulization Q6H PRN Weber, Kyra A, NP       alum & mag hydroxide-simeth (MAALOX/MYLANTA) 200-200-20 MG/5ML suspension 30 mL  30 mL Oral Q4H PRN Weber, Bella Kennedy A, NP       bacitracin ointment   Topical BID Starleen Blue, NP   [phone] Application at 12/25/22 9518   diphenhydrAMINE (BENADRYL) capsule 50 mg  50 mg Oral TID PRN Phebe Colla A, NP   50 mg at 12/22/22 2038   Or   diphenhydrAMINE (BENADRYL) injection 50 mg  50 mg Intramuscular TID PRN Weber, Kyra A, NP       DULoxetine (CYMBALTA) DR capsule 60 mg  60 mg Oral Daily Weber, Kyra A, NP   60 mg at 12/25/22 8416   gabapentin (NEURONTIN) capsule 400 mg  400 mg Oral TID Golda Acre, MD   400 mg at 12/25/22 1400   hydrOXYzine (ATARAX) tablet 25 mg  25 mg Oral TID PRN Starleen Blue, NP   25 mg at 12/25/22 1529   ibuprofen (ADVIL) tablet 600 mg  600 mg Oral Q8H PRN Weber, Kyra A, NP   600 mg at 12/25/22 1529   LORazepam (ATIVAN) tablet 2 mg  2 mg Oral TID PRN Phebe Colla A, NP   2 mg at 12/22/22 2038   Or   LORazepam (ATIVAN) injection 2 mg  2 mg Intramuscular TID PRN Weber, Kyra A, NP       magnesium hydroxide (MILK OF MAGNESIA) suspension 30 mL  30 mL Oral Daily PRN Weber, Kyra A, NP   30 mL at 12/23/22 1352   melatonin tablet 3 mg  3 mg Oral QHS Denora Wysocki, NP   3 mg at 12/24/22 2023   mometasone-formoterol  (DULERA) 200-5 MCG/ACT inhaler 2 puff  2 puff Inhalation BID Ajibola, Ene A, NP   2 puff at 12/25/22 1653   OLANZapine (ZYPREXA) tablet 15 mg  15 mg Oral QHS Golda Acre, MD   15 mg at 12/24/22 2022   OLANZapine (ZYPREXA) tablet 2.5 mg  2.5 mg Oral BID Starleen Blue, NP   2.5 mg at 12/25/22 1653   ondansetron (ZOFRAN-ODT) disintegrating tablet 4 mg  4 mg Oral Q8H PRN Weber, Kyra A, NP   4 mg at 12/23/22 1248   pantoprazole (PROTONIX) EC tablet 40 mg  40 mg Oral QAC breakfast Weber, Kyra A, NP   40 mg at 12/25/22 6063   prazosin (MINIPRESS) capsule 1 mg  1 mg Oral QHS Weber, Kyra A, NP   1 mg at 12/24/22 2022   propafenone (RYTHMOL SR) 12 hr capsule 225 mg  225 mg Oral BID Weber, Kyra A, NP   225 mg at 12/25/22 1654    Lab Results: No results found for this or any previous visit (from the past 48 hour(s)).  Blood Alcohol level:  Lab Results  Component Value Date   ETH <10 12/18/2022   ETH <10 12/14/2022    Metabolic Disorder Labs: Lab Results  Component Value Date   HGBA1C 5.4 10/22/2022   MPG 108 10/22/2022   MPG 103 04/20/2022   Lab Results  Component Value Date   PROLACTIN 20.0 12/14/2022   PROLACTIN 66.2 (H) 03/02/2021   Lab Results  Component Value Date   CHOL 188 12/14/2022   TRIG 222 (H) 12/14/2022   HDL 41 12/14/2022   CHOLHDL 4.6 12/14/2022   VLDL 44 (H) 12/14/2022   LDLCALC 103 (H) 12/14/2022   LDLCALC 117 (H) 10/22/2022  Physical Findings: AIMS:  , ,  ,  ,    CIWA:    COWS:     Musculoskeletal: Strength & Muscle Tone: within normal limits Gait & Station: normal Patient leans: N/A  Psychiatric Specialty Exam:  Presentation  General Appearance:  Fairly Groomed  Eye Contact: Fair  Speech: Clear and Coherent  Speech Volume: Decreased  Handedness: Right   Mood and Affect  Mood: Depressed; Anxious  Affect: Congruent   Thought Process  Thought Processes: Coherent  Descriptions of Associations:Intact  Orientation:Full (Time,  Place and Person)  Thought Content:Illogical  History of Schizophrenia/Schizoaffective disorder:Yes  Duration of Psychotic Symptoms:N/A  Hallucinations:Hallucinations: None   Ideas of Reference:Paranoia; Percusatory  Suicidal Thoughts:Suicidal Thoughts: Yes, Active SI Active Intent and/or Plan: With Plan  Homicidal Thoughts:Homicidal Thoughts: No HI Active Intent and/or Plan: Without Plan; With Intent   Sensorium  Memory: Remote Poor  Judgment: Poor  Insight: Poor   Executive Functions  Concentration: Fair  Attention Span: Fair  Recall: [employer] of Knowledge: Fair  Language: Fair   Psychomotor Activity  Psychomotor Activity: Psychomotor Activity: Decreased   Assets  Assets: Resilience   Sleep  Sleep: Sleep: Good    Physical Exam: Physical Exam Constitutional:      Appearance: She is obese.  Musculoskeletal:        General: Normal range of motion.  Neurological:     Mental Status: She is alert and oriented to person, place, and time.    Review of Systems  Psychiatric/Behavioral:  Positive for depression and hallucinations. Negative for memory loss, substance abuse and suicidal ideas. The patient is nervous/anxious and has insomnia.   All other systems reviewed and are negative.  Blood pressure 121/62, pulse (!) 115, temperature 97.8 F (36.6 C), temperature source Oral, resp. rate 18, height 5\' 4"  (1.626 m), weight 112.5 kg, SpO2 98%. Body mass index is 42.57 kg/m.  Treatment Plan Summary: Daily contact with patient to assess and evaluate symptoms and progress in treatment and Medication management   Safety and Monitoring: Voluntary admission to inpatient psychiatric unit for safety, stabilization and treatment Daily contact with patient to assess and evaluate symptoms and progress in treatment Patient's case to be discussed in multi-disciplinary team meeting Observation Level : q15 minute checks Vital signs: q12  hours Precautions: Safety   Long Term Goal(s): Improvement in symptoms so as ready for discharge   Short Term Goals: Ability to identify changes in lifestyle to reduce recurrence of condition will improve, Ability to verbalize feelings will improve, Ability to disclose and discuss suicidal ideas, Ability to demonstrate self-control will improve, Ability to identify and develop effective coping behaviors will improve, Ability to maintain clinical measurements within normal limits will improve, and Compliance with prescribed medications will improve   Diagnoses Principal Problem:   Schizoaffective disorder, bipolar type (HCC) Active Problems:   Borderline personality disorder (HCC)   Asperger syndrome   PTSD (post-traumatic stress disorder)   Asthma   Self-injurious behavior   Fibromyalgia   Medications -Continue Melatonin 3 mg nightly for sleep -Discontinued Abilify 2 mg 3 times daily on admission, so as to minimize amount of antipsychotic medications to one only. -Continue Zyprexa 2.5 mg at 12 PM and at 5 PM, continue 15 mg nightly for psychosis and mood stabilization -Continue gabapentin 400 mg 3 times daily for GAD -Continue Cymbalta 60 mg daily from fibromyalgia and depressive symptoms -Continue Protonix 40 mg daily for GERD -Continue prazosin 1 mg nightly for nightmares -Continue propafenone to 25  mg twice daily for cardiac arrhythmia -Continue agitation protocol medications as per the [facility]    Other PRNS -Continue Tylenol 650 mg every 6 hours PRN for mild pain -Continue Maalox 30 mg every 4 hrs PRN for indigestion -Continue Milk of Magnesia as needed every 6 hrs for constipation   Labs reviewed: QTc is 513, repeat EKG on 11/19.   Discharge Planning: Social work and case management to assist with discharge planning and identification of hospital follow-up needs prior to discharge Estimated LOS: 5-7 days Discharge Concerns: Need to establish a safety plan; Medication  compliance and effectiveness Discharge Goals: Return home with outpatient referrals for mental health follow-up including medication management/psychotherapy   I certify that inpatient services furnished can reasonably be expected to improve the patient's condition.    Starleen Blue, NP 12/25/2022, 4:55 PM Patient ID: [name], [name]   DOB: [birth year], 32 y.o.   MRN: [id number]

## 2022-12-25 NOTE — Progress Notes (Signed)
1:1 Observation Note  Patient in room c/o anxiety and hearing voices Patient screaming loud screams stating " My mother is in this room no one loves me. Prn Vistaril 25 mg PO give at 2028 not effective. Patient Pressing Lawerance Cruel alarm required redirection. Patient getting agitated. PRN Benadryl 50 mg PO, and Ativan 2 mg PO given at 2144. Patient in room with 1:1 Observation. Patient continue to state "When I get discharged I am going to die because noone loves me they took my Child I have nothing at all"   Patient provided support Sitter by bedside.

## 2022-12-25 NOTE — Progress Notes (Signed)
Nursing 1:1 Note D:Patient observed sleeping soundly in bed with eyes closed. RR even and unlabored. No distress noted.   A: 1:1 observation continues for safety  R: Patient remains safe

## 2022-12-25 NOTE — Progress Notes (Signed)
1:1 note Patient observed laying in bed mumbling to herself. Patient walked up to the medication window and talked about how much emotional pain she is in. Patient continued to mumble to herself at the medication window. No acute distress. 1:1 continued for safety. Safety maintained.

## 2022-12-25 NOTE — Progress Notes (Signed)
  Nursing 1:1 Note D:Patient observed sleeping soundly in bed with eyes closed. RR even and unlabored. No distress noted.   A: 1:1 observation continues for safety  R: Patient remains safe

## 2022-12-25 NOTE — Progress Notes (Signed)
1:1 note  Patient observed sitting in bed quietly. No distress. 1:1 continued for safety. Safety maintained.

## 2022-12-25 NOTE — Progress Notes (Signed)
1:1 note Patient observed in the bathroom about to get in the shower. No distress noted. 1:1 continued for safety. Safety maintained.

## 2022-12-25 NOTE — Group Note (Signed)
Recreation Therapy Group Note   Group Topic:Animal Assisted Therapy   Group Date: 12/25/2022 Start Time: 6045 End Time: 1030 Facilitators: Anupama Piehl-McCall, LRT,CTRS Location: [address] Dayroom   Animal-Assisted Activity (AAA) Program Checklist/Progress Notes Patient Eligibility Criteria Checklist & Daily Group note for Rec Tx Intervention  AAA/T Program Assumption of Risk Form signed by Patient/ or Parent Legal Guardian Yes  Patient understands his/her participation is voluntary Yes  Education: Charity fundraiser, Appropriate Animal Interaction   Education Outcome: Acknowledges education.    Affect/Mood: N/A   Participation Level: Did not attend    Clinical Observations/Individualized Feedback:     Plan: Continue to engage patient in RT group sessions 2-3x/week.   [name], LRT,CTRS 12/25/2022 12:53 PM

## 2022-12-25 NOTE — Group Note (Signed)
[facility] LCSW Group Therapy Note   Group Date: 12/25/2022 Start Time: 1100 End Time: 1200   Type of Therapy and Topic: Group Therapy: Avoiding Self-Sabotaging and Enabling Behaviors  Participation Level: Did Not Attend  Mood:  Description of Group:  In this group, patients will learn how to identify obstacles, self-sabotaging and enabling behaviors, as well as: what are they, why do we do them and what needs these behaviors meet. Discuss unhealthy relationships and how to have positive healthy boundaries with those that sabotage and enable. Explore aspects of self-sabotage and enabling in yourself and how to limit these self-destructive behaviors in everyday life.   Therapeutic Goals: 1. Patient will identify one obstacle that relates to self-sabotage and enabling behaviors 2. Patient will identify one personal self-sabotaging or enabling behavior they did prior to admission 3. Patient will state a plan to change the above identified behavior 4. Patient will demonstrate ability to communicate their needs through discussion and/or role play.    Summary of Patient Progress:   Did not attend   Therapeutic Modalities:  Cognitive Behavioral Therapy Person-Centered Therapy Motivational Interviewing    Marinda Elk, [place]

## 2022-12-25 NOTE — Progress Notes (Signed)
This RN went to go get patient for 1200 dose of zyprexa and patient stated "I'm not taking it. I'm not doing anything I'm going to lay here and rot and die".

## 2022-12-25 NOTE — Progress Notes (Signed)
   12/24/22 2021  Psych Admission Type (Psych Patients Only)  Admission Status Voluntary  Psychosocial Assessment  Patient Complaints Anxiety;Depression  Eye Contact Brief  Facial Expression Flat  Affect Depressed  Speech Logical/coherent  Interaction Assertive  Motor Activity Restless  Appearance/Hygiene Unremarkable  Behavior Characteristics Guarded;Cooperative  Mood Depressed  Thought Process  Coherency WDL  Content WDL  Delusions Paranoid  Perception WDL  Hallucination None reported or observed  Judgment Impaired  Confusion None  Danger to Self  Current suicidal ideation? Passive  Description of Suicide Plan "Cut on Arms"  Self-Injurious Behavior Some self-injurious ideation observed or expressed.  No lethal plan expressed  (Reported by patient no apparent intent.)  Agreement Not to Harm Self Yes  Description of Agreement Verbal  Danger to Others  Danger to Others None reported or observed  Danger to Others Abnormal  Harmful Behavior to others No threats or harm toward other people  Destructive Behavior No threats or harm toward property

## 2022-12-25 NOTE — Plan of Care (Signed)
  Problem: Education: Goal: Verbalization of understanding the information provided will improve Outcome: Progressing   Problem: Activity: Goal: Interest or engagement in activities will improve Outcome: Progressing Goal: Sleeping patterns will improve Outcome: Progressing   Problem: Coping: Goal: Ability to demonstrate self-control will improve Outcome: Progressing   Problem: Safety: Goal: Periods of time without injury will increase Outcome: Progressing   Problem: Education: Goal: Emotional status will improve Outcome: Not Progressing Goal: Mental status will improve Outcome: Not Progressing

## 2022-12-26 ENCOUNTER — Encounter (HOSPITAL_COMMUNITY): Payer: Self-pay

## 2022-12-26 DIAGNOSIS — F250 Schizoaffective disorder, bipolar type: Secondary | ICD-10-CM | POA: Diagnosis not present

## 2022-12-26 NOTE — Plan of Care (Signed)
  Problem: Education: Goal: Emotional status will improve Outcome: Not Progressing Goal: Mental status will improve Outcome: Not Progressing Goal: Verbalization of understanding the information provided will improve Outcome: Not Progressing   

## 2022-12-26 NOTE — Progress Notes (Signed)
Nursing 1:1 Note:   Patient in her room with no distress noted. Earlier in the shift, patient began pushing the STARR button. Patient was able to be verbally redirected. Scheduled medications administered, per MAR. 1:1 monitoring continues. Patient remains safe at this time.

## 2022-12-26 NOTE — Progress Notes (Signed)
Patient woke up started picking her skin stated "I am feeling sad no one loves me I just want to die" 1:1 Sitter by bedside Patient continued to ring call bell Show of support by Staff. Patient refused to be redirected. Patient became agitated screaming making noise. Prn Benadryl, Ativan and Vistaril given. Support provided. Will continue to monitor.

## 2022-12-26 NOTE — Progress Notes (Signed)
  1:1 NOTE  Patient attended programming this evening was interacting well with Peers and Staff. Patient went to her room and started screaming loud outburst. Patient stated she was bored. C/O sadness and anxiety prn Vistaril 25 mg PO given at 2200 medication not effective.   1:1 Sitter in room by bedside for safety. Encouragement and support provided.

## 2022-12-26 NOTE — Progress Notes (Signed)
[facility] MD Progress Note  12/26/2022 4:41 PM [name]  MRN:  [id number]  HPI: [name] is a 32 yo Caucasian female who is very familiar to the [facility] behavioral health service line with multiple hospitalizations over the past few months.  She is yet again admitted at this hospital voluntarily on 11/13 after she presented to the [facility] behavioral health urgent care Center initially via [employer] on 11/08 with complaints of SI with a plan to overdose on pills.  Patient was transferred for mental status.   24-hour chart review: Heart rate slightly elevated today morning at 103, but rest of V/S WNL.  Patient is continuing to be compliant with scheduled medications. Maintained on one-on-one staff monitoring for the past 24 hours. Last given agitation protocol medications at 0425 earlier today morning. Received Hydroxyzine 25 mg at same time. Received Ibuprofen for pain at 1609 today. Continues to refuse to verbally contract for safety on the unit, requiring continuous 1:1 staff monitoring.  Patient assessment note: During today's encounter, patient continues to present with delusions of persecution, visual hallucinations, auditory hallucinations, & presents with SI, with plan to run into traffic or cut her arm if discharged; States that she saw her parents standing in her room, and threatening to kill her.  She is however, calmer than she was during encounter yesterday. Mood remains very depressed, affect is congruent and flat. She continues to repeatedly state that she has nothing to live for, and would like to die so that she can go be with her husband. She also states that her family has treated her very badly, and she just wishes that they loved her.   Empathy & active listening provided, & we talked about the things that she can control and those that are beyond her control. Pt is observed to be reading her bible, states that she finds solace in doing so, and is agreeable to a referral  being made to pastoral care for prayers. Referral placed. Pt denies medication related side effects, and we are continuing all meds as listed below. Pt's symptoms at this time continue to render patient at a high risk of danger to self, as she is continuing to present with suicidal ideations with a plan, and unable to verbally contract for safety inside or outside of the hospital.  We will continue to revisit discharge planning on a daily basis.  Principal Problem: Schizoaffective disorder, bipolar type (HCC) Diagnosis: Principal Problem:   Schizoaffective disorder, bipolar type (HCC) Active Problems:   Borderline personality disorder (HCC)   Asperger syndrome   PTSD (post-traumatic stress disorder)   Asthma   Self-injurious behavior   Fibromyalgia   Prolonged QT interval  Total Time spent with patient: 45 minutes  Past Psychiatric History: See H & P  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    Chronic constipation    Depression    Drug-seeking behavior    Essential tremor    Headache    Ineffective individual coping 05/16/2022   Insomnia 01/12/2022   Intentional drug overdose (HCC) 06/05/2022   Neuromuscular disorder (HCC)  Normocytic anemia 06/05/2022   Overdose 07/22/2017   Overdose of acetaminophen 07/2017   and other meds   Overdose, intentional self-harm, initial encounter (HCC) 07/20/2021   Paranoia (HCC) 04/22/2021   Personality disorder (HCC)    Prolonged QT interval    Purposeful non-suicidal drug ingestion (HCC) 06/27/2021   Schizo-affective psychosis (HCC)    Schizoaffective disorder (HCC) 07/29/2022   Schizoaffective  disorder, bipolar type (HCC)    Seizures (HCC)    Last seizure December 2017   Skin erythema 04/27/2022   Sleep apnea    Suicidal behavior 07/25/2021   Suicidal ideation    Suicide (HCC) 07/01/2021   Suicide attempt (HCC) 07/04/2021    Past Surgical History:  Procedure Laterality Date   MOUTH SURGERY  2009 or 2010   Family History:  Family History  Problem Relation Age of Onset   Mental illness Father    Asthma Father    PDD Brother    Seizures Brother    Family Psychiatric  History: See H & P Social History:  Social History   Substance and Sexual Activity  Alcohol Use No   Alcohol/week: 1.0 standard drink of alcohol   Types: 1 Standard drinks or equivalent per week   Comment: denies at this time     Social History   Substance and Sexual Activity  Drug Use No   Comment: History of cocaine use at age 41 for 4 months    Social History   Socioeconomic History   Marital status: Widowed    Spouse name: Not on file   Number of children: 0   Years of education: Not on file   Highest education level: Not on file  Occupational History   Occupation: disability  Tobacco Use   Smoking status: Former    Types: Cigarettes   Smokeless tobacco: Never   Tobacco comments:    Smoked for 2  years age [age]  Vaping Use   Vaping status: Never Used  Substance and Sexual Activity   Alcohol use: No    Alcohol/week: 1.0 standard drink of alcohol    Types: 1 Standard drinks or equivalent per week    Comment: denies at this time   Drug use: No    Comment: History of cocaine use at age 49 for 4 months   Sexual activity: Not Currently    Birth control/protection: None  Other Topics Concern   Not on file  Social History Narrative   Marital status: Widowed      Children: daughter      Lives: with boyfriend, in two story home      Employment:  Disability      Tobacco: quit smoking; smoked for two years.      Alcohol ;none      Drugs: none   Has not traveled outside of the  country.   Right handed         Patient with hx of Fibromyalgia,Orthostatic Tachycardia,Asthma,Arthritis,Gerd, ASD   Social Determinants of Health   Financial Resource Strain: Not on file  Food Insecurity: No Food Insecurity (12/25/2022)   Hunger Vital Sign    Worried About Running Out of Food in the Last Year: Never true    Ran Out of Food in the Last Year: Never true  Transportation Needs: No Transportation Needs (12/25/2022)   PRAPARE - Administrator, Civil Service (Medical): No    Lack of Transportation (Non-Medical): No  Physical Activity: Not on file  Stress: Not on file  Social Connections: Not on file   Sleep: Good  Appetite:  Fair  Current Medications: Current Facility-Administered Medications  Medication Dose Route Frequency Provider Last Rate Last Admin   albuterol (PROVENTIL) (2.5 MG/3ML) 0.083% nebulizer solution 2.5 mg  2.5 mg Nebulization Q6H PRN Weber, Kyra A, NP       alum & mag hydroxide-simeth (MAALOX/MYLANTA) 200-200-20 MG/5ML suspension 30 mL  30 mL Oral Q4H PRN Weber, Leavy Cella, NP       bacitracin ointment   Topical BID Starleen Blue, NP   [phone] Application at 12/26/22 0806   diphenhydrAMINE (BENADRYL) capsule 50 mg  50 mg Oral TID PRN Phebe Colla A, NP   50 mg at 12/26/22 0424   Or   diphenhydrAMINE (BENADRYL) injection 50 mg  50 mg Intramuscular TID PRN Weber, Bella Kennedy A, NP       DULoxetine (CYMBALTA) DR capsule 60 mg  60 mg Oral Daily Weber, Kyra A, NP   60 mg at 12/26/22 0807   gabapentin (NEURONTIN) capsule 400 mg  400 mg Oral TID Golda Acre, MD   400 mg at 12/26/22 1418   hydrOXYzine (ATARAX) tablet 25 mg  25 mg Oral TID PRN Starleen Blue, NP   25 mg at 12/26/22 0425   ibuprofen (ADVIL) tablet 600 mg  600 mg Oral Q8H PRN Weber, Kyra A, NP   600 mg at 12/26/22 1609   LORazepam (ATIVAN) tablet 2 mg  2 mg Oral TID PRN Phebe Colla A, NP   2 mg at 12/26/22 0425   Or   LORazepam (ATIVAN) injection 2 mg  2 mg Intramuscular TID PRN Weber,  Bella Kennedy A, NP       magnesium hydroxide (MILK OF MAGNESIA) suspension 30 mL  30 mL Oral Daily PRN Weber, Kyra A, NP   30 mL at 12/23/22 1352   melatonin tablet 3 mg  3 mg Oral QHS Mavery Milling, NP   3 mg at 12/25/22 2028   mometasone-formoterol (DULERA) 200-5 MCG/ACT inhaler 2 puff  2 puff Inhalation BID Ajibola, Ene A, NP   2 puff at 12/26/22 1608   OLANZapine (ZYPREXA) tablet 15 mg  15 mg Oral QHS Golda Acre, MD   15 mg at 12/25/22 2300   OLANZapine (ZYPREXA) tablet 2.5 mg  2.5 mg Oral BID Starleen Blue, NP   2.5 mg at 12/26/22 1607   ondansetron (ZOFRAN-ODT) disintegrating tablet 4 mg  4 mg Oral Q8H PRN Weber, Kyra A, NP   4 mg at 12/25/22 1915   pantoprazole (PROTONIX) EC tablet 40 mg  40 mg Oral QAC breakfast Weber, Kyra A, NP   40 mg at 12/26/22 0807   prazosin (MINIPRESS) capsule 1 mg  1 mg Oral QHS Weber, Kyra A, NP   1 mg at 12/25/22 2028   propafenone (RYTHMOL SR) 12 hr capsule 225 mg  225 mg Oral BID Weber, Kyra A, NP   225 mg at 12/26/22 1607    Lab Results: No results found for this or any previous visit (from the past 48 hour(s)).  Blood Alcohol level:  Lab Results  Component Value Date   [facility] <10 12/18/2022   ETH <10 12/14/2022    Metabolic Disorder Labs: Lab Results  Component Value Date   HGBA1C 5.4 10/22/2022   MPG 108 10/22/2022   MPG 103 04/20/2022   Lab Results  Component Value Date   PROLACTIN 20.0 12/14/2022   PROLACTIN 66.2 (H) 03/02/2021   Lab Results  Component Value Date  CHOL 188 12/14/2022   TRIG 222 (H) 12/14/2022   HDL 41 12/14/2022   CHOLHDL 4.6 12/14/2022   VLDL 44 (H) 12/14/2022   LDLCALC 103 (H) 12/14/2022   LDLCALC 117 (H) 10/22/2022    Physical Findings: AIMS:  , ,  ,  ,    CIWA:    COWS:     Musculoskeletal: Strength & Muscle Tone: within normal limits Gait & Station: normal Patient leans: N/A  Psychiatric Specialty Exam:  Presentation  General Appearance:  Fairly Groomed  Eye Contact: Fair  Speech: Clear and  Coherent  Speech Volume: Decreased  Handedness: Right   Mood and Affect  Mood: Depressed; Anxious  Affect: Congruent   Thought Process  Thought Processes: Coherent  Descriptions of Associations:Intact  Orientation:Full (Time, Place and Person)  Thought Content:Illogical  History of Schizophrenia/Schizoaffective disorder:Yes  Duration of Psychotic Symptoms:Greater than six months  Hallucinations:Hallucinations: Auditory; Visual Description of Auditory Hallucinations: AVH of her parents in her room   Ideas of Reference:Paranoia; Percusatory  Suicidal Thoughts:Suicidal Thoughts: Yes, Active SI Active Intent and/or Plan: With Plan; With Intent  Homicidal Thoughts:Homicidal Thoughts: No   Sensorium  Memory: Immediate Fair  Judgment: Poor  Insight: Poor   Executive Functions  Concentration: Poor  Attention Span: Fair  Recall: Poor  Fund of Knowledge: Poor  Language: Fair   Psychomotor Activity  Psychomotor Activity: Psychomotor Activity: Normal   Assets  Assets: Resilience   Sleep  Sleep: Sleep: Good    Physical Exam: Physical Exam Constitutional:      Appearance: She is obese.  Musculoskeletal:        General: Normal range of motion.  Neurological:     Mental Status: She is alert and oriented to person, place, and time.    Review of Systems  Psychiatric/Behavioral:  Positive for depression and hallucinations. Negative for memory loss, substance abuse and suicidal ideas. The patient is nervous/anxious and has insomnia.   All other systems reviewed and are negative.  Blood pressure 120/68, pulse (!) 103, temperature 97.8 F (36.6 C), temperature source Oral, resp. rate 18, height 5\' 4"  (1.626 m), weight 112.5 kg, SpO2 99%. Body mass index is 42.57 kg/m.  Treatment Plan Summary: Daily contact with patient to assess and evaluate symptoms and progress in treatment and Medication management   Safety and  Monitoring: Voluntary admission to inpatient psychiatric unit for safety, stabilization and treatment Daily contact with patient to assess and evaluate symptoms and progress in treatment Patient's case to be discussed in multi-disciplinary team meeting Observation Level : q15 minute checks Vital signs: q12 hours Precautions: Safety   Long Term Goal(s): Improvement in symptoms so as ready for discharge   Short Term Goals: Ability to identify changes in lifestyle to reduce recurrence of condition will improve, Ability to verbalize feelings will improve, Ability to disclose and discuss suicidal ideas, Ability to demonstrate self-control will improve, Ability to identify and develop effective coping behaviors will improve, Ability to maintain clinical measurements within normal limits will improve, and Compliance with prescribed medications will improve   Diagnoses Principal Problem:   Schizoaffective disorder, bipolar type (HCC) Active Problems:   Borderline personality disorder (HCC)   Asperger syndrome   PTSD (post-traumatic stress disorder)   Asthma   Self-injurious behavior   Fibromyalgia   Medications -Continue Melatonin 3 mg nightly for sleep -Discontinued Abilify 2 mg 3 times daily on admission, so as to minimize amount of antipsychotic medications to one only. -Continue Zyprexa 2.5 mg at 12 PM and at  5 PM, continue 15 mg nightly for psychosis and mood stabilization -Continue gabapentin 400 mg 3 times daily for GAD -Continue Cymbalta 60 mg daily for fibromyalgia and depressive symptoms -Continue Protonix 40 mg daily for GERD -Continue prazosin 1 mg nightly for nightmares -Continue propafenone to 25 mg twice daily for cardiac arrhythmia -Continue agitation protocol medications as per the MAR    Other PRNS -Continue Tylenol 650 mg every 6 hours PRN for mild pain -Continue Maalox 30 mg every 4 hrs PRN for indigestion -Continue Milk of Magnesia as needed every 6 hrs for  constipation   Labs reviewed: QTc is 513, repeat EKG on 11/19 not completed, reordered.   Discharge Planning: Social work and case management to assist with discharge planning and identification of hospital follow-up needs prior to discharge Estimated LOS: 5-7 days Discharge Concerns: Need to establish a safety plan; Medication compliance and effectiveness Discharge Goals: Return home with outpatient referrals for mental health follow-up including medication management/psychotherapy   I certify that inpatient services furnished can reasonably be expected to improve the patient's condition.    Starleen Blue, NP 12/26/2022, 4:41 PM Patient ID: [name], female   DOB: Nov 27, 1990, 32 y.o.   MRN: [id number]

## 2022-12-26 NOTE — Progress Notes (Signed)
The patient is in her bedroom and is yelling and cursing . The nurse has been notified.

## 2022-12-26 NOTE — Group Note (Signed)
Recreation Therapy Group Note   Group Topic:Stress Management  Group Date: 12/26/2022 Start Time: 1035 End Time: 1055 Facilitators: Chenell Lozon-McCall, LRT,CTRS Location: [address] Dayroom   Group Topic: Stress Management  Goal Area(s) Addresses:  Patient will identify positive stress management techniques. Patient will identify benefits of using stress management post d/c.  Group Description: Meditation. LRT and patients discussed meditation and how it can be effective. LRT played a meditation that focused on finding your true self. LRT informed patients they could find meditations and other stress management techniques through Youtube, apps, internet, etc.   Education:  Stress Management, Discharge Planning.   Education Outcome: Acknowledges Education   Affect/Mood: N/A   Participation Level: Did not attend    Clinical Observations/Individualized Feedback:      Plan: Continue to engage patient in RT group sessions 2-3x/week.   Rolinda Impson-McCall, LRT,CTRS 12/26/2022 12:34 PM

## 2022-12-26 NOTE — [facility] Group Notes (Signed)
Group Topic/Focus: Wrap-Up Group:  The focus of this group is to help patients review their daily goal of treatment and discuss progress on daily workbooks.  Group Wrap Up Q: Please voice a quick check-in on how you are feeling & What tools are you taking away from this program to help navigate through your environment once you leave here?  Pt stated "" im feeling depressed, my tool is using the crisis center number when I need help"   Pt behavior: Observed very quiet and guarded when asked the question in group. Pt seemed eager to leave group .

## 2022-12-26 NOTE — Progress Notes (Signed)
Nursing 1:1 Note:  Patient laying in her bed. Anxious, but no distress noted. 1:1 monitoring continues. Patient remains safe at this time.

## 2022-12-26 NOTE — Progress Notes (Signed)
1:1 NOTE  Patient in bed sleeping respirations noted agitation protocol given were effective. 1:1 Sitter by bedside. Support ongoing.

## 2022-12-26 NOTE — BH IP Treatment Plan (Signed)
Interdisciplinary Treatment and Diagnostic Plan Update  12/26/2022 Time of Session: 12:00PM - UPDATE Nabeela Roda MRN: [id number]  Principal Diagnosis: Schizoaffective disorder, bipolar type (HCC)  Secondary Diagnoses: Principal Problem:   Schizoaffective disorder, bipolar type (HCC) Active Problems:   Borderline personality disorder (HCC)   Asperger syndrome   PTSD (post-traumatic stress disorder)   Asthma   Self-injurious behavior   Fibromyalgia   Prolonged QT interval   Current Medications:  Current Facility-Administered Medications  Medication Dose Route Frequency Provider Last Rate Last Admin   albuterol (PROVENTIL) (2.5 MG/3ML) 0.083% nebulizer solution 2.5 mg  2.5 mg Nebulization Q6H PRN Weber, Kyra A, NP       alum & mag hydroxide-simeth (MAALOX/MYLANTA) 200-200-20 MG/5ML suspension 30 mL  30 mL Oral Q4H PRN Weber, Bella Kennedy A, NP       bacitracin ointment   Topical BID Starleen Blue, NP   [phone] Application at 12/26/22 0806   diphenhydrAMINE (BENADRYL) capsule 50 mg  50 mg Oral TID PRN Phebe Colla A, NP   50 mg at 12/26/22 0424   Or   diphenhydrAMINE (BENADRYL) injection 50 mg  50 mg Intramuscular TID PRN Weber, Kyra A, NP       DULoxetine (CYMBALTA) DR capsule 60 mg  60 mg Oral Daily Weber, Kyra A, NP   60 mg at 12/26/22 0807   gabapentin (NEURONTIN) capsule 400 mg  400 mg Oral TID Golda Acre, MD   400 mg at 12/26/22 1418   hydrOXYzine (ATARAX) tablet 25 mg  25 mg Oral TID PRN Starleen Blue, NP   25 mg at 12/26/22 0425   ibuprofen (ADVIL) tablet 600 mg  600 mg Oral Q8H PRN Weber, Kyra A, NP   600 mg at 12/25/22 1529   LORazepam (ATIVAN) tablet 2 mg  2 mg Oral TID PRN Phebe Colla A, NP   2 mg at 12/26/22 0425   Or   LORazepam (ATIVAN) injection 2 mg  2 mg Intramuscular TID PRN Weber, Kyra A, NP       magnesium hydroxide (MILK OF MAGNESIA) suspension 30 mL  30 mL Oral Daily PRN Weber, Kyra A, NP   30 mL at 12/23/22 1352   melatonin tablet 3 mg  3 mg Oral QHS  Nkwenti, Doris, NP   3 mg at 12/25/22 2028   mometasone-formoterol (DULERA) 200-5 MCG/ACT inhaler 2 puff  2 puff Inhalation BID Ajibola, Ene A, NP   2 puff at 12/26/22 0807   OLANZapine (ZYPREXA) tablet 15 mg  15 mg Oral QHS Golda Acre, MD   15 mg at 12/25/22 2300   OLANZapine (ZYPREXA) tablet 2.5 mg  2.5 mg Oral BID Starleen Blue, NP   2.5 mg at 12/26/22 1140   ondansetron (ZOFRAN-ODT) disintegrating tablet 4 mg  4 mg Oral Q8H PRN Weber, Kyra A, NP   4 mg at 12/25/22 1915   pantoprazole (PROTONIX) EC tablet 40 mg  40 mg Oral QAC breakfast Weber, Kyra A, NP   40 mg at 12/26/22 0807   prazosin (MINIPRESS) capsule 1 mg  1 mg Oral QHS Weber, Kyra A, NP   1 mg at 12/25/22 2028   propafenone (RYTHMOL SR) 12 hr capsule 225 mg  225 mg Oral BID Weber, Kyra A, NP   225 mg at 12/26/22 0807   PTA Medications: Medications Prior to Admission  Medication Sig Dispense Refill Last Dose   albuterol (VENTOLIN HFA) 108 (90 Base) MCG/ACT inhaler Inhale 2 puffs into the lungs every 6 (  six) hours as needed for wheezing or shortness of breath. 1 each 0    ARIPiprazole (ABILIFY) 2 MG tablet Take 1 tablet (2 mg total) by mouth daily for 7 days. 7 tablet 0    Bacitracin-Polymyxin B (NEOSPORIN EX) Apply 1 Application topically as needed (Cuts).      Calcium Carbonate Antacid (TUMS PO) Take 2 tablets by mouth as needed (Heartburn).      cyclobenzaprine (FLEXERIL) 10 MG tablet Take 10 mg by mouth at bedtime.      diclofenac Sodium (VOLTAREN) 1 % GEL Apply 2 g topically 4 (four) times daily. (Patient taking differently: Apply 2 g topically in the morning, at noon, and at bedtime.)      DULoxetine (CYMBALTA) 60 MG capsule Take 1 capsule (60 mg total) by mouth daily for 7 days. 7 capsule 0    gabapentin (NEURONTIN) 600 MG tablet Take 600 mg by mouth 3 (three) times daily.      hydrOXYzine (ATARAX) 25 MG tablet Take 25 mg by mouth 4 (four) times daily as needed for anxiety.      mometasone-formoterol (DULERA) 200-5  MCG/ACT AERO Inhale 2 puffs into the lungs 2 (two) times daily. 1 each 0    OLANZapine (ZYPREXA) 15 MG tablet Take 15 mg by mouth at bedtime.      OLANZapine (ZYPREXA) 5 MG tablet Take 3 tablets (15 mg total) by mouth at bedtime for 7 days. (Patient taking differently: Take 5 mg by mouth in the morning and at bedtime. Take 1 tablet at 1200 and 1700) 21 tablet 0    ondansetron (ZOFRAN-ODT) 4 MG disintegrating tablet Take 1 tablet (4 mg total) by mouth every 8 (eight) hours as needed for nausea or vomiting. 10 tablet 0    pantoprazole (PROTONIX) 40 MG tablet Take 1 tablet (40 mg total) by mouth daily before breakfast for 7 days. 7 tablet 0    prazosin (MINIPRESS) 1 MG capsule Take 1 capsule (1 mg total) by mouth at bedtime for 7 days. 7 capsule 0    propafenone (RYTHMOL SR) 225 MG 12 hr capsule Take 225 mg by mouth 2 (two) times daily.       Patient Stressors:    Patient Strengths:    Treatment Modalities: Medication Management, Group therapy, Case management,  1 to 1 session with clinician, Psychoeducation, Recreational therapy.   Physician Treatment Plan for Primary Diagnosis: Schizoaffective disorder, bipolar type (HCC) Long Term Goal(s): Improvement in symptoms so as ready for discharge   Short Term Goals: Ability to identify changes in lifestyle to reduce recurrence of condition will improve Ability to verbalize feelings will improve Ability to disclose and discuss suicidal ideas Ability to demonstrate self-control will improve Ability to identify and develop effective coping behaviors will improve Ability to maintain clinical measurements within normal limits will improve Compliance with prescribed medications will improve  Medication Management: Evaluate patient's response, side effects, and tolerance of medication regimen.  Therapeutic Interventions: 1 to 1 sessions, Unit Group sessions and Medication administration.  Evaluation of Outcomes: Progressing  Physician Treatment  Plan for Secondary Diagnosis: Principal Problem:   Schizoaffective disorder, bipolar type (HCC) Active Problems:   Borderline personality disorder (HCC)   Asperger syndrome   PTSD (post-traumatic stress disorder)   Asthma   Self-injurious behavior   Fibromyalgia   Prolonged QT interval  Long Term Goal(s): Improvement in symptoms so as ready for discharge   Short Term Goals: Ability to identify changes in lifestyle to reduce recurrence of condition will  improve Ability to verbalize feelings will improve Ability to disclose and discuss suicidal ideas Ability to demonstrate self-control will improve Ability to identify and develop effective coping behaviors will improve Ability to maintain clinical measurements within normal limits will improve Compliance with prescribed medications will improve     Medication Management: Evaluate patient's response, side effects, and tolerance of medication regimen.  Therapeutic Interventions: 1 to 1 sessions, Unit Group sessions and Medication administration.  Evaluation of Outcomes: Progressing   RN Treatment Plan for Primary Diagnosis: Schizoaffective disorder, bipolar type (HCC) Long Term Goal(s): Knowledge of disease and therapeutic regimen to maintain health will improve  Short Term Goals: Ability to verbalize frustration and anger appropriately will improve, Ability to demonstrate self-control, Ability to verbalize feelings will improve, Ability to disclose and discuss suicidal ideas, and Compliance with prescribed medications will improve  Medication Management: RN will administer medications as ordered by provider, will assess and evaluate patient's response and provide education to patient for prescribed medication. RN will report any adverse and/or side effects to prescribing provider.  Therapeutic Interventions: 1 on 1 counseling sessions, Psychoeducation, Medication administration, Evaluate responses to treatment, Monitor vital signs and  CBGs as ordered, Perform/monitor CIWA, COWS, AIMS and Fall Risk screenings as ordered, Perform wound care treatments as ordered.  Evaluation of Outcomes: Progressing   LCSW Treatment Plan for Primary Diagnosis: Schizoaffective disorder, bipolar type (HCC) Long Term Goal(s): Safe transition to appropriate next level of care at discharge, Engage patient in therapeutic group addressing interpersonal concerns.  Short Term Goals: Engage patient in aftercare planning with referrals and resources, Increase ability to appropriately verbalize feelings, Increase emotional regulation, Facilitate acceptance of mental health diagnosis and concerns, Identify triggers associated with mental health/substance abuse issues, and Increase skills for wellness and recovery  Therapeutic Interventions: Assess for all discharge needs, 1 to 1 time with Social worker, Explore available resources and support systems, Assess for adequacy in community support network, Educate family and significant other(s) on suicide prevention, Complete Psychosocial Assessment, Interpersonal group therapy.  Evaluation of Outcomes: Progressing   Progress in Treatment: Attending groups: Yes. Participating in groups: Yes. Taking medication as prescribed: Yes. Toleration medication: Yes. Family/Significant other contact made: Yes, contacted:  [name], QP ([facility]) [phone] / Shanice therapist ([facility]) [phone] Patient understands diagnosis: Yes. Discussing patient identified problems/goals with staff: Yes. Medical problems stabilized or resolved: Yes. Denies suicidal/homicidal ideation: Yes. Issues/concerns per patient self-inventory: No.     New problem(s) identified: No, Describe:  None reported    New Short Term/Long Term Goal(s):   medication stabilization, elimination of SI thoughts, development of comprehensive mental wellness plan.     Patient Goals:  " Go home tomorrow, reach out to my ACT team  about bringing forms out to my house, and tell them that I do not want to speak to a female when I call there crisis line "    Discharge Plan or Barriers: Patient recently admitted. CSW will continue to follow and assess for appropriate referrals and possible discharge planning.     Reason for Continuation of Hospitalization: Anxiety Delusions  Depression Medication stabilization Suicidal ideation   Estimated Length of Stay: 2-4 days   Last 3 [place] Suicide Severity Risk Score: Flowsheet Row ED to Hosp-Admission (Current) [facility] from 12/18/2022 in [facility] ED from [facility]/17/2022 in [facility] Department at [facility]  C-SSRS RISK CATEGORY High Risk High Risk  High Risk       Last PHQ 2/9 Scores:    03/10/2022   10:01 PM 03/10/2022    9:56 PM 02/10/2022    1:49 AM  Depression screen PHQ 2/9  Decreased Interest 1 1 2   Down, Depressed, Hopeless 1 1 2   PHQ - 2 Score 2 2 4   Altered sleeping 1 1 2   Tired, decreased energy 1 1 2   Change in appetite 1 0 2  Feeling bad or failure about yourself  1 1 2   Trouble concentrating 1 1 2   Moving slowly or fidgety/restless 1 1 1   Suicidal thoughts 1 1 1   PHQ-9 Score 9 8 16   Difficult doing work/chores Somewhat difficult  Very difficult    Scribe for Treatment Team: Kathi Der, LCSWA 12/26/2022 3:30 PM

## 2022-12-26 NOTE — Plan of Care (Signed)
1:1 NOTE Problem: Education: Goal: Emotional status will improve Outcome: Not Progressing Goal: Mental status will improve Outcome: Not Progressing Goal: Verbalization of understanding the information provided will improve Outcome: Not Progressing  Patient in bed sleeping respirations noted, with 1:1 Sitter by her bedside. Patient has a brown bag with all her clothes in bed with her refusing to put the clothes up.   Patient remains safe.

## 2022-12-27 DIAGNOSIS — F250 Schizoaffective disorder, bipolar type: Secondary | ICD-10-CM | POA: Diagnosis not present

## 2022-12-27 MED ORDER — GABAPENTIN 300 MG PO CAPS
600.0000 mg | ORAL_CAPSULE | Freq: Three times a day (TID) | ORAL | Status: DC
  Administered 2022-12-27 – 2023-01-15 (×58): 600 mg via ORAL
  Filled 2022-12-27: qty 12
  Filled 2022-12-27: qty 2
  Filled 2022-12-27: qty 12
  Filled 2022-12-27 (×3): qty 2
  Filled 2022-12-27: qty 12
  Filled 2022-12-27 (×2): qty 2
  Filled 2022-12-27 (×2): qty 12
  Filled 2022-12-27: qty 2
  Filled 2022-12-27 (×2): qty 12
  Filled 2022-12-27: qty 2
  Filled 2022-12-27: qty 12
  Filled 2022-12-27: qty 2
  Filled 2022-12-27 (×2): qty 12
  Filled 2022-12-27: qty 2
  Filled 2022-12-27: qty 12
  Filled 2022-12-27: qty 2
  Filled 2022-12-27: qty 12
  Filled 2022-12-27: qty 2
  Filled 2022-12-27 (×2): qty 12
  Filled 2022-12-27 (×2): qty 2
  Filled 2022-12-27: qty 12
  Filled 2022-12-27: qty 2
  Filled 2022-12-27 (×3): qty 12
  Filled 2022-12-27 (×4): qty 2
  Filled 2022-12-27 (×2): qty 12
  Filled 2022-12-27: qty 2
  Filled 2022-12-27: qty 12
  Filled 2022-12-27: qty 2
  Filled 2022-12-27 (×2): qty 12
  Filled 2022-12-27: qty 2
  Filled 2022-12-27 (×3): qty 12
  Filled 2022-12-27 (×5): qty 2
  Filled 2022-12-27 (×7): qty 12
  Filled 2022-12-27: qty 2
  Filled 2022-12-27: qty 12
  Filled 2022-12-27 (×4): qty 2
  Filled 2022-12-27: qty 12
  Filled 2022-12-27: qty 2
  Filled 2022-12-27: qty 12
  Filled 2022-12-27: qty 2

## 2022-12-27 MED ORDER — DULOXETINE HCL 30 MG PO CPEP
90.0000 mg | ORAL_CAPSULE | Freq: Every day | ORAL | Status: DC
  Administered 2022-12-28 – 2023-01-15 (×19): 90 mg via ORAL
  Filled 2022-12-27: qty 6
  Filled 2022-12-27 (×4): qty 3
  Filled 2022-12-27: qty 6
  Filled 2022-12-27 (×2): qty 3
  Filled 2022-12-27: qty 6
  Filled 2022-12-27: qty 3
  Filled 2022-12-27 (×4): qty 6
  Filled 2022-12-27: qty 3
  Filled 2022-12-27: qty 6
  Filled 2022-12-27: qty 3
  Filled 2022-12-27 (×2): qty 6
  Filled 2022-12-27: qty 3
  Filled 2022-12-27: qty 6
  Filled 2022-12-27: qty 3

## 2022-12-27 MED ORDER — OLANZAPINE 5 MG PO TABS
5.0000 mg | ORAL_TABLET | Freq: Two times a day (BID) | ORAL | Status: DC
Start: 2022-12-27 — End: 2022-12-29
  Administered 2022-12-27 – 2022-12-28 (×4): 5 mg via ORAL
  Filled 2022-12-27 (×6): qty 1

## 2022-12-27 NOTE — [facility] Group Notes (Signed)
[facility] Group Notes:  (Nursing/MHT/Case Management/Adjunct)  Date:  12/27/2022  Time:  8:39 PM  Type of Therapy:  Psychoeducational Skills  Participation Level:  Did Not Attend  Participation Quality:  Resistant  Affect:  Resistant  Cognitive:  Lacking  Insight:  None  Engagement in Group:  Resistant  Modes of Intervention:  Education  Summary of Progress/Problems: The patient did not attend group this evening.   [name] 12/27/2022, 8:39 PM

## 2022-12-27 NOTE — Group Note (Signed)
Recreation Therapy Group Note   Group Topic:Relaxation  Group Date: 12/27/2022 Start Time: 1009 End Time: 1048 Facilitators: Sidnee Gambrill-McCall, LRT,CTRS Location: 500 Hall Dayroom   Group Topic: Relaxation  Goal Area(s) Addresses:  Patient will verbalize the benefits of music in relaxation. Patient will verbalize benefit of relaxation as a whole.  Intervention: Music App, Speaker  Activity:  LRT and patients discussed the importance of music and how it can be used for relaxation. Patients were then given to request the songs of their choosing. Patients would then explain the meaning of those songs to them.   Education: Relaxation, Discharge Planning.   Education Outcome: Acknowledges education/In group clarification offered/Needs additional education.    Affect/Mood: N/A   Participation Level: Did not attend    Clinical Observations/Individualized Feedback:     Plan: Continue to engage patient in RT group sessions 2-3x/week.   Dierdre Mccalip-McCall, LRT,CTRS  12/27/2022 11:18 AM
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
## 2022-12-27 NOTE — Progress Notes (Signed)
Patient observed speaking to unseen people telling them to stop telling lies. Patient took her mediications and went back to her room. Patient remains on 1:1 observation for safety

## 2022-12-27 NOTE — Progress Notes (Signed)
   12/27/22 0800  Psych Admission Type (Psych Patients Only)  Admission Status Voluntary  Psychosocial Assessment  Patient Complaints Anxiety;Anger;Agitation;Apathy  Eye Contact Intense  Facial Expression Flat  Affect Depressed  Speech Logical/coherent  Interaction Assertive  Motor Activity Restless  Appearance/Hygiene Unremarkable  Behavior Characteristics Aggressive physically;Agressive verbally;Anxious;Agitated  Aggressive Behavior  Targets Property;Self  Type of Behavior Striking out;Verbal  Effect No apparent injury  Thought Process  Coherency Disorganized  Content Blaming others  Delusions None reported or observed  Perception Hallucinations  Hallucination Auditory  Judgment Impaired  Confusion None  Danger to Self  Current suicidal ideation? Passive  Agreement Not to Harm Self Yes  Description of Agreement verbal

## 2022-12-27 NOTE — Plan of Care (Signed)
  Problem: Safety: Goal: Violent Restraint(s) Outcome: Progressing   Problem: Safety: Goal: Violent Restraint(s) Outcome: Progressing

## 2022-12-27 NOTE — Progress Notes (Signed)
Patient is in room at this time with 1:1 staff by the bed side. No acute distress noted at this time. Staff will continue provide support to patient.

## 2022-12-27 NOTE — Progress Notes (Signed)
Patient is at the window for her am medication, and assisted by 1:1 staff. Patient tolerated medication well with no side effect noted. Patient denies SI/HI/AVH. Staff will continue to monitor.

## 2022-12-27 NOTE — Group Note (Signed)
Date:  12/27/2022 Time:  11:38 AM  Group Topic/Focus:  Goals Group:   The focus of this group is to help patients establish daily goals to achieve during treatment and discuss how the patient can incorporate goal setting into their daily lives to aide in recovery. Orientation:   The focus of this group is to educate the patient on the purpose and policies of crisis stabilization and provide a format to answer questions about their admission.  The group details unit policies and expectations of patients while admitted.    Participation Level:  Active  Participation Quality:  Attentive  Affect:  Appropriate  Cognitive:  Appropriate  Insight: Appropriate  Engagement in Group:  Engaged  Modes of Intervention:  Discussion  Additional Comments:  Patient was encouraged to attend goals group multiple times.   Virl Coble T [name] 12/27/2022, 11:38 AM

## 2022-12-27 NOTE — Plan of Care (Signed)
  Problem: Education: Goal: Emotional status will improve Outcome: Not Progressing Goal: Mental status will improve Outcome: Not Progressing   

## 2022-12-27 NOTE — Progress Notes (Signed)
1:1 NOTE  Patient in bed sleeping Sitter by bedside no S/S of distress Prn medications given were effective. Patient remains safe.

## 2022-12-27 NOTE — Progress Notes (Signed)
[facility], ACTT services [phone] reported they will transport at the time of discharge.

## 2022-12-27 NOTE — Progress Notes (Signed)
[facility] MD Progress Note  12/27/2022 1:28 PM [name]  MRN:  [id number]  HPI: [name] is a 32 yo Caucasian female who is very familiar to the [facility] behavioral health service line with multiple hospitalizations over the past few months.  She is yet again admitted at this hospital voluntarily on 11/13 after she presented to the [facility] behavioral health urgent care Center initially via [employer] on 11/08 with complaints of SI with a plan to overdose on pills.  Patient was transferred for mental status.   24-hour chart review: The patient remains on one-on-one observation.  She is unable to contract for safety.  She was noted to be periodically agitated yelling and cursing at staff.  She requires frequent as needed medications. She had 1 episode this morning when she escalated him increasingly agitated with staff and became very disruptive despite verbal redirection.  Patient was in seclusion for about 53 minutes.  She is now on one-on-one observation.  Patient assessment note: When the patient was seen initially she was alert oriented cooperative and maintained fair eye contact.  She continues to present with delusions of persecution some visual and auditory hallucinations and presents with some suicidal ideations with a plan to run into traffic or cut himself.  However she was able to contract for safety in the morning but by 9:30 in the morning she became quite agitated and escalated in her behavior requiring seclusion for a brief period of time.  She is unable to maintain stability for longer than a few hours at a time. Last night she was constantly yelling saying that nobody loved her.  Empathy and active listening was provided but patient remains very labile and unstable. The plan is to increase Zyprexa to 5 mg twice daily and 15 at bedtime. Increase gabapentin to 600 p.o. 3 times daily. Continue rest of her medications as prescribed. Continue with one-on-one  observation. Principal Problem: Schizoaffective disorder, bipolar type (HCC) Diagnosis: Principal Problem:   Schizoaffective disorder, bipolar type (HCC) Active Problems:   Borderline personality disorder (HCC)   Asperger syndrome   PTSD (post-traumatic stress disorder)   Asthma   Self-injurious behavior   Fibromyalgia   Prolonged QT interval  Total Time spent with patient: 45 minutes  Past Psychiatric History: See H & P  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    Chronic constipation    Depression    Drug-seeking behavior    Essential tremor    Headache    Ineffective individual coping 05/16/2022   Insomnia 01/12/2022   Intentional drug overdose (HCC) 06/05/2022   Neuromuscular disorder (HCC)    Normocytic anemia 06/05/2022   Overdose 07/22/2017   Overdose of acetaminophen 07/2017   and other meds   Overdose, intentional self-harm, initial encounter (HCC) 07/20/2021   Paranoia (HCC) 04/22/2021   Personality disorder (HCC)    Prolonged QT interval    Purposeful non-suicidal drug ingestion (HCC) 06/27/2021   Schizo-affective psychosis (HCC)    Schizoaffective disorder (HCC) 07/29/2022   Schizoaffective disorder, bipolar type (HCC)    Seizures (HCC)    Last seizure December  2017   Skin erythema 04/27/2022   Sleep apnea    Suicidal behavior 07/25/2021   Suicidal ideation    Suicide (HCC) 07/01/2021   Suicide attempt (HCC) 07/04/2021    Past Surgical History:  Procedure Laterality Date   MOUTH SURGERY  2009 or 2010   Family History:  Family History  Problem Relation Age of Onset   Mental illness Father    Asthma Father     PDD Brother    Seizures Brother    Family Psychiatric  History: See H & P Social History:  Social History   Substance and Sexual Activity  Alcohol Use No   Alcohol/week: 1.0 standard drink of alcohol   Types: 1 Standard drinks or equivalent per week   Comment: denies at this time     Social History   Substance and Sexual Activity  Drug Use No   Comment: History of cocaine use at age 48 for 4 months    Social History   Socioeconomic History   Marital status: Widowed    Spouse name: Not on file   Number of children: 0   Years of education: Not on file   Highest education level: Not on file  Occupational History   Occupation: disability  Tobacco Use   Smoking status: Former    Types: Cigarettes   Smokeless tobacco: Never   Tobacco comments:    Smoked for 2  years age [age]  Vaping Use   Vaping status: Never Used  Substance and Sexual Activity   Alcohol use: No    Alcohol/week: 1.0 standard drink of alcohol    Types: 1 Standard drinks or equivalent per week    Comment: denies at this time   Drug use: No    Comment: History of cocaine use at age 8 for 4 months   Sexual activity: Not Currently    Birth control/protection: None  Other Topics Concern   Not on file  Social History Narrative   Marital status: Widowed      Children: daughter      Lives: with boyfriend, in two story home      Employment:  Disability      Tobacco: quit smoking; smoked for two years.      Alcohol ;none      Drugs: none   Has not traveled outside of the country.   Right handed         Patient with hx of Fibromyalgia,Orthostatic Tachycardia,Asthma,Arthritis,Gerd, ASD   Social Determinants of Health   Financial Resource Strain: Not on file  Food Insecurity: No Food Insecurity (12/25/2022)   Hunger Vital Sign    Worried About Running Out of Food in the Last Year: Never true    Ran Out of Food in the Last Year: Never true  Transportation Needs: No Transportation Needs (12/25/2022)    PRAPARE - Administrator, Civil Service (Medical): No    Lack of Transportation (Non-Medical): No  Physical Activity: Not on file  Stress: Not on file  Social Connections: Not on file   Sleep: Good  Appetite:  Fair  Current Medications: Current Facility-Administered Medications  Medication Dose Route Frequency Provider Last Rate Last Admin   albuterol (PROVENTIL) (2.5 MG/3ML) 0.083% nebulizer solution 2.5 mg  2.5 mg Nebulization Q6H PRN Weber, Kyra A, NP       alum & mag hydroxide-simeth (MAALOX/MYLANTA) 200-200-20 MG/5ML suspension 30 mL  30 mL Oral Q4H PRN Weber, Leavy Cella, NP  bacitracin ointment   Topical BID Starleen Blue, NP   Given at 12/27/22 0827   diphenhydrAMINE (BENADRYL) capsule 50 mg  50 mg Oral TID PRN Phebe Colla A, NP   50 mg at 12/26/22 2320   Or   diphenhydrAMINE (BENADRYL) injection 50 mg  50 mg Intramuscular TID PRN Weber, Bella Kennedy A, NP   50 mg at 12/27/22 0912   [START ON 12/28/2022] DULoxetine (CYMBALTA) DR capsule 90 mg  90 mg Oral Daily Rex Kras, MD       gabapentin (NEURONTIN) capsule 600 mg  600 mg Oral TID Rex Kras, MD   600 mg at 12/27/22 1306   hydrOXYzine (ATARAX) tablet 25 mg  25 mg Oral TID PRN Starleen Blue, NP   25 mg at 12/26/22 2224   ibuprofen (ADVIL) tablet 600 mg  600 mg Oral Q8H PRN Weber, Kyra A, NP   600 mg at 12/27/22 1309   LORazepam (ATIVAN) tablet 2 mg  2 mg Oral TID PRN Phebe Colla A, NP   2 mg at 12/26/22 2320   Or   LORazepam (ATIVAN) injection 2 mg  2 mg Intramuscular TID PRN Weber, Bella Kennedy A, NP   2 mg at 12/27/22 0912   magnesium hydroxide (MILK OF MAGNESIA) suspension 30 mL  30 mL Oral Daily PRN Weber, Kyra A, NP   30 mL at 12/23/22 1352   melatonin tablet 3 mg  3 mg Oral QHS Nkwenti, Doris, NP   3 mg at 12/26/22 2018   mometasone-formoterol (DULERA) 200-5 MCG/ACT inhaler 2 puff  2 puff Inhalation BID Ajibola, Ene A, NP   2 puff at 12/27/22 0824   OLANZapine (ZYPREXA) tablet 15 mg  15 mg Oral QHS Golda Acre, MD   15 mg at 12/26/22 2018   OLANZapine (ZYPREXA) tablet 5 mg  5 mg Oral BID Rex Kras, MD   5 mg at 12/27/22 1307   ondansetron (ZOFRAN-ODT) disintegrating tablet 4 mg  4 mg Oral Q8H PRN Weber, Kyra A, NP   4 mg at 12/26/22 2224   pantoprazole (PROTONIX) EC tablet 40 mg  40 mg Oral QAC breakfast Weber, Kyra A, NP   40 mg at 12/27/22 0824   prazosin (MINIPRESS) capsule 1 mg  1 mg Oral QHS Weber, Kyra A, NP   1 mg at 12/26/22 2018   propafenone (RYTHMOL SR) 12 hr capsule 225 mg  225 mg Oral BID Weber, Kyra A, NP   225 mg at 12/27/22 0981    Lab Results: No results found for this or any previous visit (from the past 48 hour(s)).  Blood Alcohol level:  Lab Results  Component Value Date   ETH <10 12/18/2022   ETH <10 12/14/2022    Metabolic Disorder Labs: Lab Results  Component Value Date   HGBA1C 5.4 10/22/2022   MPG 108 10/22/2022   MPG 103 04/20/2022   Lab Results  Component Value Date   PROLACTIN 20.0 12/14/2022   PROLACTIN 66.2 (H) 03/02/2021   Lab Results  Component Value Date   CHOL 188 12/14/2022   TRIG 222 (H) 12/14/2022   HDL 41 12/14/2022   CHOLHDL 4.6 12/14/2022   VLDL 44 (H) 12/14/2022   LDLCALC 103 (H) 12/14/2022   LDLCALC 117 (H) 10/22/2022    Physical Findings: AIMS:  , ,  ,  ,    CIWA:    COWS:     Musculoskeletal: Strength & Muscle Tone: within normal limits Gait & Station: normal Patient leans: N/A  Psychiatric Specialty Exam:  Presentation  General Appearance:  Casual; Disheveled  Eye Contact: Fair  Speech: Slow  Speech Volume: Decreased  Handedness: Right   Mood and Affect  Mood: Dysphoric; Depressed  Affect: Restricted; Tearful; Labile   Thought Process  Thought Processes: Disorganized  Descriptions of Associations:Tangential  Orientation:Full (Time, Place and Person)  Thought Content:Perseveration; Rumination; Scattered  History of Schizophrenia/Schizoaffective disorder:No  Duration of  Psychotic Symptoms:N/A  Hallucinations:Hallucinations: None Description of Auditory Hallucinations: AVH of her parents in her room   Ideas of Reference:Paranoia  Suicidal Thoughts:Suicidal Thoughts: Yes, Active SI Active Intent and/or Plan: With Intent  Homicidal Thoughts:Homicidal Thoughts: Yes, Passive   Sensorium  Memory: Immediate Fair; Remote Fair; Recent Fair  Judgment: Poor  Insight: Poor   Executive Functions  Concentration: Poor  Attention Span: Fair  Recall: Poor  Fund of Knowledge: Fair  Language: Fair   Psychomotor Activity  Psychomotor Activity: Psychomotor Activity: Normal   Assets  Assets: Resilience; Leisure Time   Sleep  Sleep: Sleep: Good Number of Hours of Sleep: 9.75    Physical Exam: Physical Exam Constitutional:      Appearance: She is obese.  Musculoskeletal:        General: Normal range of motion.  Neurological:     Mental Status: She is alert and oriented to person, place, and time.    Review of Systems  Psychiatric/Behavioral:  Positive for depression and hallucinations. Negative for memory loss, substance abuse and suicidal ideas. The patient is nervous/anxious and has insomnia.   All other systems reviewed and are negative.  Blood pressure 118/85, pulse 98, temperature 98.5 F (36.9 C), temperature source Oral, resp. rate 18, height 5\' 4"  (1.626 m), weight 112.5 kg, SpO2 99%. Body mass index is 42.57 kg/m.  Treatment Plan Summary: Daily contact with patient to assess and evaluate symptoms and progress in treatment and Medication management   Safety and Monitoring: Voluntary admission to inpatient psychiatric unit for safety, stabilization and treatment Daily contact with patient to assess and evaluate symptoms and progress in treatment Patient's case to be discussed in multi-disciplinary team meeting Observation Level : q15 minute checks Vital signs: q12 hours Precautions: Safety   Long Term Goal(s):  Improvement in symptoms so as ready for discharge   Short Term Goals: Ability to identify changes in lifestyle to reduce recurrence of condition will improve, Ability to verbalize feelings will improve, Ability to disclose and discuss suicidal ideas, Ability to demonstrate self-control will improve, Ability to identify and develop effective coping behaviors will improve, Ability to maintain clinical measurements within normal limits will improve, and Compliance with prescribed medications will improve   Diagnoses Principal Problem:   Schizoaffective disorder, bipolar type (HCC) Active Problems:   Borderline personality disorder (HCC)   Asperger syndrome   PTSD (post-traumatic stress disorder)   Asthma   Self-injurious behavior   Fibromyalgia   Medications -Continue Melatonin 3 mg nightly for sleep -Discontinued Abilify 2 mg 3 times daily on admission, so as to minimize amount of antipsychotic medications to one only. -Increase Zyprexa 5 mg at 12 PM and at 5 PM, continue 15 mg nightly for psychosis and mood stabilization -He is gabapentin 600 p.o. 3 times daily. -Continue Cymbalta 60 mg daily for fibromyalgia and depressive symptoms -Continue Protonix 40 mg daily for GERD -Continue prazosin 1 mg nightly for nightmares -Continue propafenone to 25 mg twice daily for cardiac arrhythmia -Continue agitation protocol medications as per the [facility]    Other PRNS -Continue Tylenol 650 mg every  6 hours PRN for mild pain -Continue Maalox 30 mg every 4 hrs PRN for indigestion -Continue Milk of Magnesia as needed every 6 hrs for constipation   Labs reviewed: QTc is 513, repeat EKG on 11/19 not completed, reordered.   Discharge Planning: Social work and case management to assist with discharge planning and identification of hospital follow-up needs prior to discharge Estimated LOS: 5-7 days Discharge Concerns: Need to establish a safety plan; Medication compliance and effectiveness Discharge  Goals: Return home with outpatient referrals for mental health follow-up including medication management/psychotherapy   I certify that inpatient services furnished can reasonably be expected to improve the patient's condition.    Rex Kras, MD 12/27/2022, 1:28 PM Patient ID: [name], female   DOB: [birth year], 32 y.o.   MRN: [id number]  Patient ID: [name], female   DOB: 05-Apr-1990, 32 y.o.   MRN: [id number]

## 2022-12-28 DIAGNOSIS — F250 Schizoaffective disorder, bipolar type: Secondary | ICD-10-CM | POA: Diagnosis not present

## 2022-12-28 NOTE — Progress Notes (Signed)
   12/28/22 0800  Psych Admission Type (Psych Patients Only)  Admission Status Voluntary  Psychosocial Assessment  Patient Complaints Insomnia;Anxiety;Agitation;Irritability  Eye Contact Intense  Facial Expression Anxious  Affect Anxious  Speech Logical/coherent  Interaction Assertive  Motor Activity Fidgety  Appearance/Hygiene Unremarkable  Behavior Characteristics Appropriate to situation  Mood Anxious  Thought Process  Coherency Disorganized  Content Blaming others  Delusions Referential  Perception WDL  Hallucination None reported or observed  Judgment Impaired  Confusion None  Danger to Self  Current suicidal ideation? Passive  Self-Injurious Behavior No self-injurious ideation or behavior indicators observed or expressed   Agreement Not to Harm Self Yes  Description of Agreement Verbal  Danger to Others  Danger to Others None reported or observed  Danger to Others Abnormal  Harmful Behavior to others No threats or harm toward other people  Destructive Behavior No threats or harm toward property

## 2022-12-28 NOTE — Progress Notes (Signed)
   12/28/22 2100  Psych Admission Type (Psych Patients Only)  Admission Status Voluntary  Psychosocial Assessment  Patient Complaints Anxiety  Eye Contact  (WNL)  Facial Expression Anxious  Affect Anxious  Speech Logical/coherent  Interaction Assertive  Motor Activity Restless  Appearance/Hygiene Unremarkable  Behavior Characteristics Appropriate to situation  Mood Anxious  Thought Process  Coherency Disorganized  Content Blaming others;Other (Comment) (Impulsive)  Delusions None reported or observed  Perception WDL  Hallucination None reported or observed  Judgment Poor  Confusion None  Danger to Self  Current suicidal ideation? Denies  Self-Injurious Behavior No self-injurious ideation or behavior indicators observed or expressed   Agreement Not to Harm Self Yes  Description of Agreement Verbal  Danger to Others  Danger to Others None reported or observed  Danger to Others Abnormal  Harmful Behavior to others No threats or harm toward other people  Destructive Behavior No threats or harm toward property

## 2022-12-28 NOTE — Progress Notes (Signed)
[facility] MD Progress Note  12/28/2022 2:07 PM [name]  MRN:  [id number]  HPI: [name] is a 32 yo Caucasian female who is very familiar to the [facility] behavioral health service line with multiple hospitalizations over the past few months.  She is yet again admitted at this hospital voluntarily on 11/13 after she presented to the [facility] behavioral health urgent care [facility] initially via [facility] on 11/08 with complaints of SI with a plan to overdose on pills.  Patient was transferred for mental status.   24-hour chart review: The patient remains on one-on-one observation.  She continues to have unpredictable episodes when she is agitated and periodically starts yelling and cursing at staff.  She is emotionally labile.  She requires as needed medications and last night required the use of the agitation protocol again. Patient has not been in seclusion since yesterday.  Patient assessment note: When the patient was seen initially she was alert oriented cooperative and maintained fair eye contact.  She continues to endorse vague hallucinations telling her to hurt herself.  She reports that she is taking her medications which seems to help her somewhat and today she rates her anxiety as being lower.  She maintained fair to good eye contact and was able to contract for safety for the time being.  After discussion with the staff patient will be closely monitored but will be taken off the one-on-one observation.  Continue Zyprexa to 5 mg twice daily and 15 at bedtime. Continue gabapentin to 600 p.o. 3 times daily. Continue rest of her medications as prescribed. Continue with one-on-one observation. Principal Problem: Schizoaffective disorder, bipolar type (HCC) Diagnosis: Principal Problem:   Schizoaffective disorder, bipolar type (HCC) Active Problems:   Borderline personality disorder (HCC)   Asperger syndrome   PTSD (post-traumatic stress disorder)   Asthma   Self-injurious  behavior   Fibromyalgia   Prolonged QT interval  Total Time spent with patient: 45 minutes  Past Psychiatric History: See H & P  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    Chronic constipation    Depression    Drug-seeking behavior    Essential tremor    Headache    Ineffective individual coping 05/16/2022   Insomnia 01/12/2022   Intentional drug overdose (HCC) 06/05/2022   Neuromuscular disorder (HCC)    Normocytic anemia 06/05/2022   Overdose 07/22/2017   Overdose of acetaminophen 07/2017   and other meds   Overdose, intentional self-harm, initial encounter (HCC) 07/20/2021   Paranoia (HCC) 04/22/2021   Personality disorder (HCC)    Prolonged QT interval    Purposeful non-suicidal drug ingestion (HCC) 06/27/2021   Schizo-affective psychosis (HCC)    Schizoaffective disorder (HCC) 07/29/2022   Schizoaffective disorder, bipolar type (HCC)    Seizures (HCC)    Last seizure December 2017   Skin erythema 04/27/2022   Sleep apnea    Suicidal behavior 07/25/2021   Suicidal ideation    Suicide (HCC) 07/01/2021   Suicide attempt (HCC) 07/04/2021    Past Surgical History:  Procedure Laterality Date   MOUTH SURGERY  2009 or 2010  Family History:  Family History  Problem Relation Age of Onset   Mental illness Father    Asthma Father    PDD Brother    Seizures Brother    Family Psychiatric  History: See H & P Social History:  Social History   Substance and Sexual Activity  Alcohol Use No   Alcohol/week: 1.0 standard drink of alcohol   Types: 1 Standard drinks or equivalent per week   Comment: denies at this time     Social  History   Substance and Sexual Activity  Drug Use No   Comment: History of cocaine use at age 89 for 4 months    Social History   Socioeconomic History   Marital status: Widowed    Spouse name: Not on file   Number of children: 0   Years of education: Not on file   Highest education level: Not on file  Occupational History   Occupation: disability  Tobacco Use   Smoking status: Former    Types: Cigarettes   Smokeless tobacco: Never   Tobacco comments:    Smoked for 2  years age [age]  Vaping Use   Vaping status: Never Used  Substance and Sexual Activity   Alcohol use: No    Alcohol/week: 1.0 standard drink of alcohol    Types: 1 Standard drinks or equivalent per week    Comment: denies at this time   Drug use: No    Comment: History of cocaine use at age 46 for 4 months   Sexual activity: Not Currently    Birth control/protection: None  Other Topics Concern   Not on file  Social History Narrative   Marital status: Widowed      Children: daughter      Lives: with boyfriend, in two story home      Employment:  Disability      Tobacco: quit smoking; smoked for two years.      Alcohol ;none      Drugs: none   Has not traveled outside of the country.   Right handed         Patient with hx of Fibromyalgia,Orthostatic Tachycardia,Asthma,Arthritis,Gerd, ASD   Social Determinants of Health   Financial Resource Strain: Not on file  Food Insecurity: No Food Insecurity (12/25/2022)   Hunger Vital Sign    Worried About Running Out of Food in the Last Year: Never true    Ran Out of Food in the Last Year: Never true  Transportation Needs: No Transportation Needs (12/25/2022)   PRAPARE - Administrator, Civil Service (Medical): No    Lack of Transportation (Non-Medical): No  Physical Activity: Not on file  Stress: Not on file  Social Connections: Not on file   Sleep: Good  Appetite:  Fair  Current Medications: Current Facility-Administered Medications   Medication Dose Route Frequency Provider Last Rate Last Admin   albuterol (PROVENTIL) (2.5 MG/3ML) 0.083% nebulizer solution 2.5 mg  2.5 mg Nebulization Q6H PRN Weber, Kyra A, NP       alum & mag hydroxide-simeth (MAALOX/MYLANTA) 200-200-20 MG/5ML suspension 30 mL  30 mL Oral Q4H PRN Weber, Bella Kennedy A, NP       bacitracin ointment   Topical BID Starleen Blue, NP   Given at 12/27/22 2030   diphenhydrAMINE (BENADRYL) capsule 50 mg  50 mg Oral TID PRN Phebe Colla A, NP   50 mg at 12/26/22 2320   Or   diphenhydrAMINE (BENADRYL) injection 50 mg  50  mg Intramuscular TID PRN Weber, Bella Kennedy A, NP   50 mg at 12/27/22 0912   DULoxetine (CYMBALTA) DR capsule 90 mg  90 mg Oral Daily Rex Kras, MD   90 mg at 12/28/22 0800   gabapentin (NEURONTIN) capsule 600 mg  600 mg Oral TID Rex Kras, MD   600 mg at 12/28/22 0800   hydrOXYzine (ATARAX) tablet 25 mg  25 mg Oral TID PRN Starleen Blue, NP   25 mg at 12/26/22 2224   ibuprofen (ADVIL) tablet 600 mg  600 mg Oral Q8H PRN Weber, Kyra A, NP   600 mg at 12/27/22 1309   LORazepam (ATIVAN) tablet 2 mg  2 mg Oral TID PRN Phebe Colla A, NP   2 mg at 12/26/22 2320   Or   LORazepam (ATIVAN) injection 2 mg  2 mg Intramuscular TID PRN Weber, Bella Kennedy A, NP   2 mg at 12/27/22 0912   magnesium hydroxide (MILK OF MAGNESIA) suspension 30 mL  30 mL Oral Daily PRN Weber, Kyra A, NP   30 mL at 12/23/22 1352   melatonin tablet 3 mg  3 mg Oral QHS Nkwenti, Doris, NP   3 mg at 12/27/22 2021   mometasone-formoterol (DULERA) 200-5 MCG/ACT inhaler 2 puff  2 puff Inhalation BID Ajibola, Ene A, NP   2 puff at 12/28/22 0802   OLANZapine (ZYPREXA) tablet 15 mg  15 mg Oral QHS Golda Acre, MD   15 mg at 12/27/22 2021   OLANZapine (ZYPREXA) tablet 5 mg  5 mg Oral BID Rex Kras, MD   5 mg at 12/28/22 1135   ondansetron (ZOFRAN-ODT) disintegrating tablet 4 mg  4 mg Oral Q8H PRN Weber, Kyra A, NP   4 mg at 12/26/22 2224   pantoprazole (PROTONIX) EC tablet 40 mg  40 mg Oral QAC  breakfast Weber, Kyra A, NP   40 mg at 12/28/22 0800   prazosin (MINIPRESS) capsule 1 mg  1 mg Oral QHS Weber, Kyra A, NP   1 mg at 12/27/22 2021   propafenone (RYTHMOL SR) 12 hr capsule 225 mg  225 mg Oral BID Weber, Kyra A, NP   225 mg at 12/28/22 0800    Lab Results: No results found for this or any previous visit (from the past 48 hour(s)).  Blood Alcohol level:  Lab Results  Component Value Date   ETH <10 12/18/2022   ETH <10 12/14/2022    Metabolic Disorder Labs: Lab Results  Component Value Date   HGBA1C 5.4 10/22/2022   MPG 108 10/22/2022   MPG 103 04/20/2022   Lab Results  Component Value Date   PROLACTIN 20.0 12/14/2022   PROLACTIN 66.2 (H) 03/02/2021   Lab Results  Component Value Date   CHOL 188 12/14/2022   TRIG 222 (H) 12/14/2022   HDL 41 12/14/2022   CHOLHDL 4.6 12/14/2022   VLDL 44 (H) 12/14/2022   LDLCALC 103 (H) 12/14/2022   LDLCALC 117 (H) 10/22/2022    Physical Findings: AIMS:  , ,  ,  ,    CIWA:    COWS:     Musculoskeletal: Strength & Muscle Tone: within normal limits Gait & Station: normal Patient leans: N/A  Psychiatric Specialty Exam:  Presentation  General Appearance:  Bizarre; Casual; Disheveled  Eye Contact: Fair  Speech: Slow  Speech Volume: Decreased  Handedness: Right   Mood and Affect  Mood: Anxious; Depressed; Labile  Affect: Labile   Thought Process  Thought Processes: Disorganized  Descriptions of  Associations:Tangential  Orientation:Full (Time, Place and Person)  Thought Content:Perseveration; Rumination; Scattered  History of Schizophrenia/Schizoaffective disorder:Yes  Duration of Psychotic Symptoms:Greater than six months  Hallucinations:Hallucinations: Command   Ideas of Reference:Paranoia  Suicidal Thoughts:Suicidal Thoughts: Yes, Passive SI Active Intent and/or Plan: With Intent  Homicidal Thoughts:Homicidal Thoughts: No   Sensorium  Memory: Immediate Fair; Remote Fair; Recent  Fair  Judgment: Poor  Insight: Poor   Executive Functions  Concentration: Poor  Attention Span: Poor  Recall: Poor  Fund of Knowledge: Poor  Language: Fair   Psychomotor Activity  Psychomotor Activity: Psychomotor Activity: Normal   Assets  Assets: Resilience; Leisure Time   Sleep  Sleep: Sleep: Good Number of Hours of Sleep: 7.5    Physical Exam: Physical Exam Constitutional:      Appearance: She is obese.  Musculoskeletal:        General: Normal range of motion.  Neurological:     Mental Status: She is alert and oriented to person, place, and time.    Review of Systems  Psychiatric/Behavioral:  Positive for depression and hallucinations. Negative for memory loss, substance abuse and suicidal ideas. The patient is nervous/anxious and has insomnia.   All other systems reviewed and are negative.  Blood pressure 122/78, pulse 74, temperature 98.4 F (36.9 C), temperature source Oral, resp. rate 20, height 5\' 4"  (1.626 m), weight 112.5 kg, SpO2 99%. Body mass index is 42.57 kg/m.  Treatment Plan Summary: Daily contact with patient to assess and evaluate symptoms and progress in treatment and Medication management   Safety and Monitoring: Voluntary admission to inpatient psychiatric unit for safety, stabilization and treatment Daily contact with patient to assess and evaluate symptoms and progress in treatment Patient's case to be discussed in multi-disciplinary team meeting Observation Level : q15 minute checks Vital signs: q12 hours Precautions: Safety   Long Term Goal(s): Improvement in symptoms so as ready for discharge   Short Term Goals: Ability to identify changes in lifestyle to reduce recurrence of condition will improve, Ability to verbalize feelings will improve, Ability to disclose and discuss suicidal ideas, Ability to demonstrate self-control will improve, Ability to identify and develop effective coping behaviors will improve,  Ability to maintain clinical measurements within normal limits will improve, and Compliance with prescribed medications will improve   Diagnoses Principal Problem:   Schizoaffective disorder, bipolar type (HCC) Active Problems:   Borderline personality disorder (HCC)   Asperger syndrome   PTSD (post-traumatic stress disorder)   Asthma   Self-injurious behavior   Fibromyalgia   Medications -Continue Melatonin 3 mg nightly for sleep -Discontinued Abilify 2 mg 3 times daily on admission, so as to minimize amount of antipsychotic medications to one only. -Increase Zyprexa 5 mg at 12 PM and at 5 PM, continue 15 mg nightly for psychosis and mood stabilization -He is gabapentin 600 p.o. 3 times daily. -Continue Cymbalta 60 mg daily for fibromyalgia and depressive symptoms -Continue Protonix 40 mg daily for GERD -Continue prazosin 1 mg nightly for nightmares -Continue propafenone to 25 mg twice daily for cardiac arrhythmia -Continue agitation protocol medications as per the MAR    Other PRNS -Continue Tylenol 650 mg every 6 hours PRN for mild pain -Continue Maalox 30 mg every 4 hrs PRN for indigestion -Continue Milk of Magnesia as needed every 6 hrs for constipation   Labs reviewed: QTc is 513, repeat EKG on 11/19 not completed, reordered.   Discharge Planning: Social work and case management to assist with discharge planning and identification of hospital  follow-up needs prior to discharge Estimated LOS: 5-7 days Discharge Concerns: Need to establish a safety plan; Medication compliance and effectiveness Discharge Goals: Return home with outpatient referrals for mental health follow-up including medication management/psychotherapy   I certify that inpatient services furnished can reasonably be expected to improve the patient's condition.    Rex Kras, MD 12/28/2022, 2:07 PM Patient ID: [name], female   DOB: [birth year], 32 y.o.   MRN: [id number]

## 2022-12-28 NOTE — Plan of Care (Signed)
  Problem: Education: Goal: Knowledge of [facility] Education information/materials will improve Outcome: Progressing Goal: Verbalization of understanding the information provided will improve 12/28/2022 2120 by Gara Kroner, RN Outcome: Progressing 12/28/2022 2120 by Gara Kroner, RN Outcome: Progressing   Problem: Education: Goal: Emotional status will improve Outcome: Not Progressing Goal: Mental status will improve 12/28/2022 2120 by Gara Kroner, RN Outcome: Not Progressing 12/28/2022 2120 by Gara Kroner, RN Outcome: Progressing

## 2022-12-28 NOTE — Progress Notes (Signed)
1:1 Note Pt observed sleeping with even respirations, snoring slightly. Pt remains on 1:1 obs for safety, will continue to monitor.

## 2022-12-28 NOTE — Group Note (Signed)
Recreation Therapy Group Note   Group Topic:Problem Solving  Group Date: 12/28/2022 Start Time: 1020 End Time: 1040 Facilitators: Bodi Palmeri-McCall, LRT,CTRS Location: [address] [employer]   Group Topic: Team Building, Problem Solving  Goal Area(s) Addresses:  Patient will effectively work with peer towards shared goal.  Patient will identify skills used to make activity successful.  Patient will identify how skills used during activity can be applied to reach post d/c goals.   Intervention: STEM Activity- Glass blower/designer  Group Description: Tallest Pharmacist, community. In teams of 5-6, patients were given 11 craft pipe cleaners. Using the materials provided, patients were instructed to compete again the opposing team(s) to build the tallest free-standing structure from floor level. The activity was timed; difficulty increased by Clinical research associate as Production designer, theatre/television/film continued.  Systematically resources were removed with additional directions for example, placing one arm behind their back, working in silence, and shape stipulations. LRT facilitated post-activity discussion reviewing team processes and necessary communication skills involved in completion. Patients were encouraged to reflect how the skills utilized, or not utilized, in this activity can be incorporated to positively impact support systems post discharge.  Education: Pharmacist, community, Scientist, physiological, Discharge Planning   Education Outcome: Acknowledges education/In group clarification offered/Needs additional education.    Affect/Mood: Flat   Participation Level: None   Participation Quality: None   Behavior: Disinterested   Speech/Thought Process: Unfocused   Insight: None   Judgement: None   Modes of Intervention: STEM Activity   Patient Response to Interventions:  Disengaged   Education Outcome:  In group clarification offered    Clinical Observations/Individualized Feedback: Pt stayed in group to get the  instructions. Pt expressed not wanting to do activity because "it makes me feel like I'm is school and I don't want to feel like in school". Pt left early and did not return.     Plan: Continue to engage patient in RT group sessions 2-3x/week.   Akirah Storck-McCall, LRT,CTRS 12/28/2022 11:34 AM

## 2022-12-28 NOTE — [facility] Group Notes (Signed)
Spirituality group facilitated by Chaplain Katy Mairin Lindsley, BCC.   Group Description: Group focused on topic of hope. Patients participated in facilitated discussion around topic, connecting with one another around experiences and definitions for hope. Group members engaged with visual explorer photos, reflecting on what hope looks like for them today. Group engaged in discussion around how their definitions of hope are present today in hospital.   Modalities: Psycho-social ed, Adlerian, Narrative, MI   Patient Progress: Did not attend.  

## 2022-12-28 NOTE — Care Management Important Message (Signed)
Medicare IM printed and given to social work to give to the patient prior to discharge.

## 2022-12-28 NOTE — Progress Notes (Signed)
1:1 note. Patient was instructed to stay back in the unit due to destructive behavior, she got upset and went back to the room, and banging on the wall. She is back  on 1:1 observation at this time. No agitation protocol given at this time, as patient was verbally redirected by staff. Staff will continue to provide support to patient.

## 2022-12-28 NOTE — Progress Notes (Signed)
1:1 Note,  Pt is a sleep at this time, no tissue noted or reported at this time. Pt remains safe on the unit, 1:1 obs maintained for safety, will continue to monitor.

## 2022-12-28 NOTE — Progress Notes (Signed)
1:1 Note: Patient is in room at this time with 1:1 staff. She ambulated to the medication window to receive her 5 pm medication. Patient tolerated medication well with no side effect note. No acute distress noted at this time. Staff will continue to maintain patient safety and comfort.

## 2022-12-28 NOTE — B[facility] Group Notes (Signed)
Adult Psychoeducational Group Note  Date:  12/28/2022 Time:  10:32 AM  Group Topic/Focus:  Goals Group:   The focus of this group is to help patients establish daily goals to achieve during treatment and discuss how the patient can incorporate goal setting into their daily lives to aide in recovery.  Participation Level:  Active  Participation Quality:  Appropriate  Affect:  Appropriate  Cognitive:  Appropriate  Insight: Appropriate  Engagement in Group:  Engaged  Modes of Intervention:  Discussion  Additional Comments:  \The patient engaged in mgroup.  Octavio Manns 12/28/2022, 10:32 AM

## 2022-12-28 NOTE — Progress Notes (Signed)
Patient ID: [name], female   DOB: 13-Feb-1990, 32 y.o.   MRN: [id number]  Nursing 1:1 note D:Pt resting on the bed, eyes closed. RR even and unlabored. No distress noted. She received her HS Medications with no adverse reactions. Patient denies SI/HI/AVH.   A: 1:1 observation continues for safety   R: pt remains safe

## 2022-12-28 NOTE — Progress Notes (Signed)
Patient was assisted to the medication widow this am to receive her medication, she  willingly took the medication without any persuasion. Patient tolerated medication well with no side effect noted. Close observation maintained. Staff will continue to provide support to patient.

## 2022-12-28 NOTE — [facility] Group Notes (Signed)
Adult Psychoeducational Group Note  Date:  12/28/2022 Time:  8:59 PM  Group Topic/Focus:  Wrap-Up Group:   The focus of this group is to help patients review their daily goal of treatment and discuss progress on daily workbooks.  Participation Level:  Minimal  Participation Quality:  Appropriate  Affect:  Depressed and Flat  Cognitive:  Appropriate  Insight: Good  Engagement in Group:  Engaged  Modes of Intervention:  Discussion  Additional Comments:   Pt states her day hasn't been the best due to her hearing and seeing things all day long. Pt expressed some frustration with the doctors because "they don't listen when I tell them what works". Pt endorsed anxiety and depression at a 10, SI, HI, AVH.  [name] 12/28/2022, 8:59 PM

## 2022-12-28 NOTE — Progress Notes (Addendum)
     12/28/2022       2:49 PM   [name]   Type of Note: Medicare IM  Signed by patient and copy was given to patient this afternoon. Patient informed of right to appeal discharge, provided phone number to [facility]. Patient expressed no interest in appealing discharge at this time. CSW will continue to monitor situation.   Signed:  Mora Pedraza, LCSW-A 12/28/2022  2:49 PM

## 2022-12-28 NOTE — Plan of Care (Signed)
Problem: Education: Goal: Knowledge of [facility] Education information/materials will improve Outcome: Progressing   Problem: Activity: Goal: Interest or engagement in activities will improve Outcome: Progressing

## 2022-12-28 NOTE — Plan of Care (Signed)
Problem: Activity: Goal: Interest or engagement in activities will improve Outcome: Progressing   Problem: Coping: Goal: Ability to verbalize frustrations and anger appropriately will improve Outcome: Progressing   Problem: Safety: Goal: Periods of time without injury will increase Outcome: Progressing

## 2022-12-29 DIAGNOSIS — F250 Schizoaffective disorder, bipolar type: Secondary | ICD-10-CM | POA: Diagnosis not present

## 2022-12-29 MED ORDER — OLANZAPINE 7.5 MG PO TABS
15.0000 mg | ORAL_TABLET | Freq: Two times a day (BID) | ORAL | Status: DC
Start: 2022-12-29 — End: 2023-01-03
  Administered 2022-12-29 – 2023-01-03 (×10): 15 mg via ORAL
  Filled 2022-12-29 (×5): qty 2
  Filled 2022-12-29: qty 6
  Filled 2022-12-29 (×8): qty 2
  Filled 2022-12-29: qty 6
  Filled 2022-12-29: qty 2

## 2022-12-29 NOTE — Progress Notes (Signed)
1:1 Note: Patient sitting in room conversing well with MHT. Pt moved to room 504, tolerated lunch well. Safety maintained.

## 2022-12-29 NOTE — Progress Notes (Signed)
Patient ID: [name], female   DOB: 1991-01-27, 32 y.o.   MRN: [id number]   Nursing 1:1 note   D:Pt resting in the bed, eyes are closed. RR even and unlabored. She appears to be asleep. Patient is noted snoring.   A: 1:1 observation continues for safety    R: Pt remains safe; no concerns voiced or noted.

## 2022-12-29 NOTE — Progress Notes (Signed)
Patient ID: [name], female   DOB: 1990/09/09, 32 y.o.   MRN: [id number]   Nursing 1:1 note   D:Pt resting in the bed, eyes are closed. RR even and unlabored. She appears to be asleep.    A: 1:1 observation continues for safety    R: Pt remains safe; no concerns voiced or noted.

## 2022-12-29 NOTE — Progress Notes (Signed)
[facility] MD Progress Note  12/29/2022 11:16 AM [name]  MRN:  [id number]  HPI: [name]. [name] is a 32 yo Caucasian female who is very familiar to the [facility] behavioral health service line with multiple hospitalizations over the past few months.  She is yet again admitted at this hospital voluntarily on 11/13 after she presented to the [facility] behavioral health urgent care Center initially via [employer] on 11/08 with complaints of SI with a plan to overdose on pills.  Patient was transferred for mental status.   24-hour chart review: Staff report that the patient did not doing well and remains unpredictable and labile.  She has been verbally aggressive and has also tried to take things off the wall and requiring seclusion with the door open.  She continues to be a danger to self and others.  However she is compliant with medications.  She received as needed hydroxyzine.  She remains on one-on-one observation.  Patient assessment note: The patient was lying in bed extremely disheveled withdrawn and reports that she is still hearing voices and that she is not in control of her emotions and that she wants to hurt herself.  She rates her hallucinations as 7/10.  She is unable to contract for safety.  She is tolerating medication with no side effects. Current plan is to: Increase Zyprexa to 15 mg twice a day  Continue gabapentin to 600 p.o. 3 times daily. Continue rest of her medications as prescribed. Continue with one-on-one observation. Principal Problem: Schizoaffective disorder, bipolar type (HCC) Diagnosis: Principal Problem:   Schizoaffective disorder, bipolar type (HCC) Active Problems:   Borderline personality disorder (HCC)   Asperger syndrome   PTSD (post-traumatic stress disorder)   Asthma   Self-injurious behavior   Fibromyalgia   Prolonged QT interval  Total Time spent with patient: 45 minutes  Past Psychiatric History: See H & P  Past Medical History:  Past  Medical History:  Diagnosis Date   Acid reflux    Adjustment disorder with mixed anxiety and depressed mood 01/31/2022   Adjustment disorder with mixed disturbance of emotions and conduct 08/03/2019   Anxiety    Asthma    last attack 03/13/15 or 03/14/15   Autism    Bipolar 1 disorder, depressed, severe (HCC) 07/25/2021   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    Chronic constipation    Depression    Drug-seeking behavior    Essential tremor    Headache    Ineffective individual coping 05/16/2022   Insomnia 01/12/2022   Intentional drug overdose (HCC) 06/05/2022   Neuromuscular disorder (HCC)    Normocytic anemia 06/05/2022   Overdose 07/22/2017   Overdose of acetaminophen 07/2017   and other meds   Overdose, intentional self-harm, initial encounter (HCC) 07/20/2021   Paranoia (HCC) 04/22/2021   Personality disorder (HCC)    Prolonged QT interval    Purposeful non-suicidal drug ingestion (HCC) 06/27/2021   Schizo-affective psychosis (HCC)    Schizoaffective disorder (HCC) 07/29/2022   Schizoaffective disorder, bipolar type (HCC)    Seizures (HCC)    Last seizure December 2017   Skin erythema 04/27/2022   Sleep apnea    Suicidal behavior 07/25/2021   Suicidal ideation    Suicide (HCC) 07/01/2021   Suicide attempt (HCC) 07/04/2021    Past Surgical History:  Procedure Laterality Date   MOUTH SURGERY  2009 or 2010   Family History:  Family History  Problem Relation Age of Onset   Mental illness Father  Asthma Father    PDD Brother    Seizures Brother    Family Psychiatric  History: See H & P Social History:  Social History   Substance and Sexual Activity  Alcohol Use No   Alcohol/week: 1.0 standard drink of alcohol   Types: 1 Standard drinks or equivalent per week   Comment: denies at this time     Social History   Substance and Sexual Activity  Drug Use No   Comment: History of cocaine use at age 71 for 4 months    Social History   Socioeconomic History    Marital status: Widowed    Spouse name: Not on file   Number of children: 0   Years of education: Not on file   Highest education level: Not on file  Occupational History   Occupation: disability  Tobacco Use   Smoking status: Former    Types: Cigarettes   Smokeless tobacco: Never   Tobacco comments:    Smoked for 2  years age [age]  Vaping Use   Vaping status: Never Used  Substance and Sexual Activity   Alcohol use: No    Alcohol/week: 1.0 standard drink of alcohol    Types: 1 Standard drinks or equivalent per week    Comment: denies at this time   Drug use: No    Comment: History of cocaine use at age 55 for 4 months   Sexual activity: Not Currently    Birth control/protection: None  Other Topics Concern   Not on file  Social History Narrative   Marital status: Widowed      Children: daughter      Lives: with boyfriend, in two story home      Employment:  Disability      Tobacco: quit smoking; smoked for two years.      Alcohol ;none      Drugs: none   Has not traveled outside of the country.   Right handed         Patient with hx of Fibromyalgia,Orthostatic Tachycardia,Asthma,Arthritis,Gerd, ASD   Social Determinants of Health   Financial Resource Strain: Not on file  Food Insecurity: No Food Insecurity (12/25/2022)   Hunger Vital Sign    Worried About Running Out of Food in the Last Year: Never true    Ran Out of Food in the Last Year: Never true  Transportation Needs: No Transportation Needs (12/25/2022)   PRAPARE - Administrator, Civil Service (Medical): No    Lack of Transportation (Non-Medical): No  Physical Activity: Not on file  Stress: Not on file  Social Connections: Not on file   Sleep: Good  Appetite:  Fair  Current Medications: Current Facility-Administered Medications  Medication Dose Route Frequency Provider Last Rate Last Admin   albuterol (PROVENTIL) (2.5 MG/3ML) 0.083% nebulizer solution 2.5 mg  2.5 mg Nebulization Q6H PRN  Weber, Kyra A, NP       alum & mag hydroxide-simeth (MAALOX/MYLANTA) 200-200-20 MG/5ML suspension 30 mL  30 mL Oral Q4H PRN Weber, Bella Kennedy A, NP       bacitracin ointment   Topical BID Starleen Blue, NP   Given at 12/29/22 0813   diphenhydrAMINE (BENADRYL) capsule 50 mg  50 mg Oral TID PRN Phebe Colla A, NP   50 mg at 12/26/22 2320   Or   diphenhydrAMINE (BENADRYL) injection 50 mg  50 mg Intramuscular TID PRN Phebe Colla A, NP   50 mg at 12/27/22 0912   DULoxetine (CYMBALTA)  DR capsule 90 mg  90 mg Oral Daily Rex Kras, MD   90 mg at 12/29/22 0810   gabapentin (NEURONTIN) capsule 600 mg  600 mg Oral TID Rex Kras, MD   600 mg at 12/29/22 0809   hydrOXYzine (ATARAX) tablet 25 mg  25 mg Oral TID PRN Starleen Blue, NP   25 mg at 12/29/22 1191   ibuprofen (ADVIL) tablet 600 mg  600 mg Oral Q8H PRN Weber, Kyra A, NP   600 mg at 12/27/22 1309   LORazepam (ATIVAN) tablet 2 mg  2 mg Oral TID PRN Phebe Colla A, NP   2 mg at 12/26/22 2320   Or   LORazepam (ATIVAN) injection 2 mg  2 mg Intramuscular TID PRN Phebe Colla A, NP   2 mg at 12/27/22 0912   magnesium hydroxide (MILK OF MAGNESIA) suspension 30 mL  30 mL Oral Daily PRN Weber, Kyra A, NP   30 mL at 12/23/22 1352   melatonin tablet 3 mg  3 mg Oral QHS Nkwenti, Doris, NP   3 mg at 12/28/22 2027   mometasone-formoterol (DULERA) 200-5 MCG/ACT inhaler 2 puff  2 puff Inhalation BID Ajibola, Ene A, NP   2 puff at 12/29/22 0809   OLANZapine (ZYPREXA) tablet 15 mg  15 mg Oral QHS Golda Acre, MD   15 mg at 12/28/22 2030   OLANZapine (ZYPREXA) tablet 5 mg  5 mg Oral BID Rex Kras, MD   5 mg at 12/28/22 1630   ondansetron (ZOFRAN-ODT) disintegrating tablet 4 mg  4 mg Oral Q8H PRN Weber, Kyra A, NP   4 mg at 12/26/22 2224   pantoprazole (PROTONIX) EC tablet 40 mg  40 mg Oral QAC breakfast Weber, Kyra A, NP   40 mg at 12/29/22 0701   prazosin (MINIPRESS) capsule 1 mg  1 mg Oral QHS Weber, Kyra A, NP   1 mg at 12/28/22 2028   propafenone  (RYTHMOL SR) 12 hr capsule 225 mg  225 mg Oral BID Weber, Kyra A, NP   225 mg at 12/29/22 0809    Lab Results: No results found for this or any previous visit (from the past 48 hour(s)).  Blood Alcohol level:  Lab Results  Component Value Date   ETH <10 12/18/2022   ETH <10 12/14/2022    Metabolic Disorder Labs: Lab Results  Component Value Date   HGBA1C 5.4 10/22/2022   MPG 108 10/22/2022   MPG 103 04/20/2022   Lab Results  Component Value Date   PROLACTIN 20.0 12/14/2022   PROLACTIN 66.2 (H) 03/02/2021   Lab Results  Component Value Date   CHOL 188 12/14/2022   TRIG 222 (H) 12/14/2022   HDL 41 12/14/2022   CHOLHDL 4.6 12/14/2022   VLDL 44 (H) 12/14/2022   LDLCALC 103 (H) 12/14/2022   LDLCALC 117 (H) 10/22/2022    Physical Findings: AIMS:  , ,  ,  ,    CIWA:    COWS:     Musculoskeletal: Strength & Muscle Tone: within normal limits Gait & Station: normal Patient leans: N/A  Psychiatric Specialty Exam:  Presentation  General Appearance:  Bizarre; Casual; Disheveled  Eye Contact: Fair  Speech: Slow  Speech Volume: Decreased  Handedness: Right   Mood and Affect  Mood: Anxious; Depressed; Labile  Affect: Labile   Thought Process  Thought Processes: Disorganized  Descriptions of Associations:Tangential  Orientation:Full (Time, Place and Person)  Thought Content:Perseveration; Rumination; Scattered  History of Schizophrenia/Schizoaffective disorder:Yes  Duration  of Psychotic Symptoms:Greater than six months  Hallucinations:Hallucinations: Command   Ideas of Reference:Paranoia  Suicidal Thoughts:Suicidal Thoughts: Yes, Passive  Homicidal Thoughts:Homicidal Thoughts: No   Sensorium  Memory: Immediate Fair; Remote Fair; Recent Fair  Judgment: Poor  Insight: Poor   Executive Functions  Concentration: Poor  Attention Span: Poor  Recall: Poor  Fund of Knowledge: Poor  Language: Fair   Psychomotor Activity   Psychomotor Activity: Psychomotor Activity: Normal   Assets  Assets: Resilience; Leisure Time   Sleep  Sleep: Sleep: Good Number of Hours of Sleep: 7.5    Physical Exam: Physical Exam Constitutional:      Appearance: She is obese.  Musculoskeletal:        General: Normal range of motion.  Neurological:     Mental Status: She is alert and oriented to person, place, and time.    Review of Systems  Psychiatric/Behavioral:  Positive for depression and hallucinations. Negative for memory loss, substance abuse and suicidal ideas. The patient is nervous/anxious and has insomnia.   All other systems reviewed and are negative.  Blood pressure 124/78, pulse 100, temperature 98.4 F (36.9 C), temperature source Oral, resp. rate 20, height 5\' 4"  (1.626 m), weight 112.5 kg, SpO2 99%. Body mass index is 42.57 kg/m.  Treatment Plan Summary: Daily contact with patient to assess and evaluate symptoms and progress in treatment and Medication management   Safety and Monitoring: Voluntary admission to inpatient psychiatric unit for safety, stabilization and treatment Daily contact with patient to assess and evaluate symptoms and progress in treatment Patient's case to be discussed in multi-disciplinary team meeting Observation Level : q15 minute checks Vital signs: q12 hours Precautions: Safety   Long Term Goal(s): Improvement in symptoms so as ready for discharge   Short Term Goals: Ability to identify changes in lifestyle to reduce recurrence of condition will improve, Ability to verbalize feelings will improve, Ability to disclose and discuss suicidal ideas, Ability to demonstrate self-control will improve, Ability to identify and develop effective coping behaviors will improve, Ability to maintain clinical measurements within normal limits will improve, and Compliance with prescribed medications will improve   Diagnoses Principal Problem:   Schizoaffective disorder, bipolar type  (HCC) Active Problems:   Borderline personality disorder (HCC)   Asperger syndrome   PTSD (post-traumatic stress disorder)   Asthma   Self-injurious behavior   Fibromyalgia   Medications -Continue Melatonin 3 mg nightly for sleep  -Increase Zyprexa 15 mg twice a day 15 mg nightly for psychosis and mood stabilization -She is on gabapentin 600 p.o. 3 times daily. -Increase Cymbalta to 90 mg mg daily for fibromyalgia and depressive symptoms -Continue Protonix 40 mg daily for GERD -Continue prazosin 1 mg nightly for nightmares -Continue propafenone to 25 mg twice daily for cardiac arrhythmia -Continue agitation protocol medications as per the MAR    Other PRNS -Continue Tylenol 650 mg every 6 hours PRN for mild pain -Continue Maalox 30 mg every 4 hrs PRN for indigestion -Continue Milk of Magnesia as needed every 6 hrs for constipation   Labs reviewed: QTc is 513, repeat EKG on 11/19 not completed, reordered.   Discharge Planning: Social work and case management to assist with discharge planning and identification of hospital follow-up needs prior to discharge Estimated LOS: 5-7 days consider referral to [facility]. Discharge Concerns: Need to establish a safety plan; Medication compliance and effectiveness Discharge Goals: Return home with outpatient referrals for mental health follow-up including medication management/psychotherapy   I certify that inpatient  services furnished can reasonably be expected to improve the patient's condition.    Rex Kras, MD 12/29/2022, 11:16 AM Patient ID: [name], female   DOB: 1990-10-02, 32 y.o.   MRN: [id number]

## 2022-12-29 NOTE — Plan of Care (Signed)
  Problem: Activity: Goal: Sleeping patterns will improve Outcome: Progressing   Problem: Safety: Goal: Periods of time without injury will increase Outcome: Progressing   Problem: Health Behavior/Discharge Planning: Goal: Compliance with treatment plan for underlying cause of condition will improve Outcome: Progressing

## 2022-12-29 NOTE — Progress Notes (Signed)
1:1 Note: Patient lying in bed with eyes closed at this time. No distress noted or complaints made. Pt medication compliant. Tolerated dinner well. 1:1 maintained. Pt remains safe.

## 2022-12-29 NOTE — Progress Notes (Signed)
1:1 Note- Pt sitting up in bed conversing with MHT at this time. No complaints made or acute distress noted. Pt calm and cooperative at present. Safety maintained.

## 2022-12-29 NOTE — Progress Notes (Signed)
Pt states that she feels that she should go to [facility].  Pt states "we have to stop this revolving door".  Medications taken as ordered, 1:1 continued, safety maintained.   12/29/22 2103  Psych Admission Type (Psych Patients Only)  Admission Status Voluntary  Psychosocial Assessment  Patient Complaints Anxiety;Depression;Irritability  Eye Contact Intense  Facial Expression Animated;Anxious  Affect Preoccupied;Anxious;Labile;Irritable  Speech Logical/coherent  Interaction Assertive;Needy  Motor Activity Fidgety;Restless  Appearance/Hygiene Unremarkable  Behavior Characteristics Anxious;Impulsive;Fidgety  Mood Anxious;Labile;Irritable;Preoccupied  Thought Process  Coherency Disorganized  Content Blaming others  Delusions None reported or observed  Perception WDL  Hallucination None reported or observed  Judgment Poor  Confusion None  Danger to Self  Current suicidal ideation? Denies  Description of Suicide Plan denies  Self-Injurious Behavior No self-injurious ideation or behavior indicators observed or expressed   Agreement Not to Harm Self Yes  Description of Agreement verbal  Danger to Others  Danger to Others None reported or observed  Danger to Others Abnormal  Harmful Behavior to others No threats or harm toward other people  Destructive Behavior No threats or harm toward property

## 2022-12-29 NOTE — Progress Notes (Signed)
Patient ID: [name], female   DOB: [birth year], 32 y.o.   MRN: [id number]   Nursing 1:1 note  D:Pt resting on the bed, eyes open.  RR even and unlabored. She states she cannot rest, yet refuses intervention at this time. Patient denies SI/HI/AVH.    A: 1:1 observation continues for safety    R: pt remains safe

## 2022-12-29 NOTE — Progress Notes (Signed)
Patient ID: [name], female   DOB: 19-Oct-1990, 32 y.o.   MRN: [id number]   Nursing 1:1 note   D:Pt sitting up, eating breakfast and drinking juice. She received her am Protonix per providers orders. No adverse reactions noted.     A: 1:1 observation continues for safety    R: Pt remains safe; no concerns voiced or noted.

## 2022-12-29 NOTE — Progress Notes (Signed)
1:1 Note: Patient currently in room calm and cooperative. Pt able to take a shower on shift. Tolerated snack well. Safety maintained.

## 2022-12-29 NOTE — Progress Notes (Signed)
1:1 Note- Patient lying in bed resting with eyes closed. No distress noted. 1:1 observation by MHT continues for pt's safety. Patient tolerated AM medication well. Vistaril 25 mg administered for anxiety with effective results. Safety maintained.

## 2022-12-29 NOTE — [facility] Group Notes (Signed)
Child/Adolescent Psychoeducational Group Note  Date:  12/29/2022 Time:  8:34 PM  Group Topic/Focus:  Wrap-Up Group:   The focus of this group is to help patients review their daily goal of treatment and discuss progress on daily workbooks.  Participation Level:  Active  Participation Quality:  Appropriate  Affect:  Appropriate  Cognitive:  Appropriate  Insight:  Appropriate  Engagement in Group:  Engaged  Modes of Intervention:  Support  Additional Comments:  Pt attend group today. Pt stated that her group was to stay positive today. Pt day was a 2 out of 10, because pt felt down but had a good day. Something positive that happened today was taking a shower Tomorrow goal is to work on going to more groups.   [name] 12/29/2022, 8:34 PM

## 2022-12-29 NOTE — Progress Notes (Signed)
Patient ID: [name], female   DOB: 08-23-1990, 32 y.o.   MRN: [id number]   Nursing 1:1 note   D:Pt resting in the bed, eyes are closed. RR even and unlabored. She appears to be asleep.    A: 1:1 observation continues for safety    R: Pt remains safe; no concerns voiced or noted.

## 2022-12-29 NOTE — Progress Notes (Signed)
   12/29/22 0810  Psych Admission Type (Psych Patients Only)  Admission Status Voluntary  Psychosocial Assessment  Patient Complaints Anxiety;Depression  Eye Contact Intense  Facial Expression Anxious;Flat  Affect Anxious;Depressed  Speech Logical/coherent  Interaction Assertive  Motor Activity Fidgety  Appearance/Hygiene Unremarkable  Behavior Characteristics Appropriate to situation  Mood Anxious  Thought Process  Coherency Disorganized  Content Blaming others  Delusions None reported or observed  Perception WDL  Hallucination None reported or observed  Judgment Poor  Confusion None  Danger to Self  Current suicidal ideation? Denies  Self-Injurious Behavior No self-injurious ideation or behavior indicators observed or expressed   Agreement Not to Harm Self Yes  Description of Agreement Verbal  Danger to Others  Danger to Others None reported or observed  Danger to Others Abnormal  Harmful Behavior to others No threats or harm toward other people  Destructive Behavior No threats or harm toward property

## 2022-12-30 DIAGNOSIS — F250 Schizoaffective disorder, bipolar type: Secondary | ICD-10-CM | POA: Diagnosis not present

## 2022-12-30 NOTE — Progress Notes (Signed)
1:1 Note: Patient resting in bed at this time, no distress noted or complaints made. Pt able to tolerate dinner in the cafeteria without any issues. Pt remain safe.

## 2022-12-30 NOTE — Progress Notes (Signed)
D. Pt resting in bed with eyes closed, snoring respirations.  No distress noted.  A.  1:1 continued as ordered for Pt safety  R.  Pt remains safe on the unit.  Will continue to monitor

## 2022-12-30 NOTE — Progress Notes (Signed)
1:1 Note: Patient resting in bed with eyes closed at this time. No distress noted. 1:1 observation continues. Pt remain safe.

## 2022-12-30 NOTE — Progress Notes (Signed)
   12/30/22 0539  15 Minute Checks  Location Bedroom  Visual Appearance Calm  Behavior Sleeping  Sleep (Behavioral Health Patients Only)  Calculate sleep? (Click Yes once per 24 hr at 0600 safety check) Yes  Documented sleep last 24 hours 11

## 2022-12-30 NOTE — Progress Notes (Signed)
   12/30/22 0745  Psych Admission Type (Psych Patients Only)  Admission Status Voluntary  Psychosocial Assessment  Patient Complaints Anxiety;Irritability;Depression  Eye Contact Intense  Facial Expression Anxious;Flat  Affect Depressed;Anxious;Irritable;Preoccupied  Speech Press photographer;Restless  Appearance/Hygiene Unremarkable  Behavior Characteristics Anxious;Irritable;Fidgety  Mood Anxious;Labile;Irritable;Preoccupied  Thought Process  Coherency Disorganized  Content Blaming others  Delusions None reported or observed  Perception WDL  Hallucination None reported or observed  Judgment Poor  Confusion None  Danger to Self  Current suicidal ideation? Passive  Description of Suicide Plan Denies  Self-Injurious Behavior No self-injurious ideation or behavior indicators observed or expressed   Agreement Not to Harm Self Yes  Description of Agreement Verbal  Danger to Others  Danger to Others None reported or observed  Danger to Others Abnormal  Harmful Behavior to others No threats or harm toward other people  Destructive Behavior No threats or harm toward property

## 2022-12-30 NOTE — Progress Notes (Signed)
D.  Pt hopeful to get off of 1:1 tomorrow.  Pt states that she had her Zyprexa 15 mg at HS yesterday and slept really well.  Pt very concerned that medication not ordered at this time.  Pt taken her melatonin and presently lying in bed speaking with her MHT sitter.   A.   NP notified of Pt request to have Zyprexa dose at [facility].  1:1 continued as ordered for Pt's safety.    R.  Pt remains safe on the unit, will continue to monitor.

## 2022-12-30 NOTE — Progress Notes (Signed)
[facility] MD Progress Note  12/30/2022 11:16 AM Maddalena Horng  MRN:  [id number]  HPI: Sherisa Tuitt. Ruffino is a 32 yo Caucasian female who is very familiar to the [facility] behavioral health service line with multiple hospitalizations over the past few months.  She is yet again admitted at this hospital voluntarily on 11/13 after she presented to the [facility] behavioral health urgent care Center initially via [facility] on 11/08 with complaints of SI with a plan to overdose on pills.  Patient was transferred for mental status.   24-hour chart review: Staff reports that the patient has been extremely labile and unpredictable.  She has been agitated and has remained on one-on-one observation but has managed to stay out of the seclusion room.  She slept 11 hours.  She remains anxious and remains suicidal.  Patient assessment note: The patient was lying in bed and was alert oriented and cooperative.  She maintained fair to poor eye contact.  She continues to endorse feeling angry and hearing voices telling her to kill herself.  However she feels a little bit better compared to yesterday.  She was a little bit more communicative today stating that she lives with her friend and has an ACT team.  She is willing to be off the one-on-one observation during the daytime but she will be closely monitored because every time she has lack of attention she tends to act out. Current plan is to: Continue Zyprexa to 15 mg twice a day  Continue gabapentin to 600 p.o. 3 times daily. Continue rest of her medications as prescribed. Continue with one-on-one observation. Principal Problem: Schizoaffective disorder, bipolar type (HCC) Diagnosis: Principal Problem:   Schizoaffective disorder, bipolar type (HCC) Active Problems:   Borderline personality disorder (HCC)   Asperger syndrome   PTSD (post-traumatic stress disorder)   Asthma   Self-injurious behavior   Fibromyalgia   Prolonged QT interval  Total Time spent with  patient: 45 minutes  Past Psychiatric History: See H & P  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    Chronic constipation    Depression    Drug-seeking behavior    Essential tremor    Headache    Ineffective individual coping 05/16/2022   Insomnia 01/12/2022   Intentional drug overdose (HCC) 06/05/2022   Neuromuscular disorder (HCC)    Normocytic anemia 06/05/2022   Overdose 07/22/2017   Overdose of acetaminophen 07/2017   and other meds   Overdose, intentional self-harm, initial encounter (HCC) 07/20/2021   Paranoia (HCC) 04/22/2021   Personality disorder (HCC)    Prolonged QT interval    Purposeful non-suicidal drug ingestion (HCC) 06/27/2021   Schizo-affective psychosis (HCC)    Schizoaffective disorder (HCC) 07/29/2022   Schizoaffective disorder, bipolar type (HCC)    Seizures (HCC)    Last seizure December 2017   Skin erythema 04/27/2022   Sleep apnea    Suicidal behavior 07/25/2021   Suicidal ideation    Suicide (HCC) 07/01/2021   Suicide attempt (HCC) 07/04/2021    Past Surgical History:  Procedure Laterality Date   MOUTH SURGERY  2009 or 2010  Family History:  Family History  Problem Relation Age of Onset   Mental illness Father    Asthma Father    PDD Brother    Seizures Brother    Family Psychiatric  History: See H & P Social History:  Social History   Substance and Sexual Activity  Alcohol Use No   Alcohol/week: 1.0 standard drink of alcohol   Types: 1 Standard drinks or equivalent per week   Comment: denies at this time     Social History   Substance and Sexual Activity  Drug Use No   Comment: History  of cocaine use at age 43 for 4 months    Social History   Socioeconomic History   Marital status: Widowed    Spouse name: Not on file   Number of children: 0   Years of education: Not on file   Highest education level: Not on file  Occupational History   Occupation: disability  Tobacco Use   Smoking status: Former    Types: Cigarettes   Smokeless tobacco: Never   Tobacco comments:    Smoked for 2  years age [age]  Vaping Use   Vaping status: Never Used  Substance and Sexual Activity   Alcohol use: No    Alcohol/week: 1.0 standard drink of alcohol    Types: 1 Standard drinks or equivalent per week    Comment: denies at this time   Drug use: No    Comment: History of cocaine use at age 62 for 4 months   Sexual activity: Not Currently    Birth control/protection: None  Other Topics Concern   Not on file  Social History Narrative   Marital status: Widowed      Children: daughter      Lives: with boyfriend, in two story home      Employment:  Disability      Tobacco: quit smoking; smoked for two years.      Alcohol ;none      Drugs: none   Has not traveled outside of the country.   Right handed         Patient with hx of Fibromyalgia,Orthostatic Tachycardia,Asthma,Arthritis,Gerd, ASD   Social Determinants of Health   Financial Resource Strain: Not on file  Food Insecurity: No Food Insecurity (12/25/2022)   Hunger Vital Sign    Worried About Running Out of Food in the Last Year: Never true    Ran Out of Food in the Last Year: Never true  Transportation Needs: No Transportation Needs (12/25/2022)   PRAPARE - Administrator, Civil Service (Medical): No    Lack of Transportation (Non-Medical): No  Physical Activity: Not on file  Stress: Not on file  Social Connections: Not on file   Sleep: Good  Appetite:  Fair  Current Medications: Current Facility-Administered Medications  Medication Dose Route Frequency Provider Last Rate Last Admin   albuterol  (PROVENTIL) (2.5 MG/3ML) 0.083% nebulizer solution 2.5 mg  2.5 mg Nebulization Q6H PRN Weber, Kyra A, NP       alum & mag hydroxide-simeth (MAALOX/MYLANTA) 200-200-20 MG/5ML suspension 30 mL  30 mL Oral Q4H PRN Weber, Bella Kennedy A, NP       bacitracin ointment   Topical BID Starleen Blue, NP   Given at 12/30/22 0803   diphenhydrAMINE (BENADRYL) capsule 50 mg  50 mg Oral TID PRN Phebe Colla A, NP   50 mg at 12/26/22 2320   Or   diphenhydrAMINE (BENADRYL) injection 50 mg  50  mg Intramuscular TID PRN Weber, Bella Kennedy A, NP   50 mg at 12/27/22 0912   DULoxetine (CYMBALTA) DR capsule 90 mg  90 mg Oral Daily Rex Kras, MD   90 mg at 12/30/22 0745   gabapentin (NEURONTIN) capsule 600 mg  600 mg Oral TID Rex Kras, MD   600 mg at 12/30/22 0745   hydrOXYzine (ATARAX) tablet 25 mg  25 mg Oral TID PRN Starleen Blue, NP   25 mg at 12/30/22 0748   ibuprofen (ADVIL) tablet 600 mg  600 mg Oral Q8H PRN Weber, Kyra A, NP   600 mg at 12/29/22 1952   LORazepam (ATIVAN) tablet 2 mg  2 mg Oral TID PRN Phebe Colla A, NP   2 mg at 12/26/22 2320   Or   LORazepam (ATIVAN) injection 2 mg  2 mg Intramuscular TID PRN Phebe Colla A, NP   2 mg at 12/27/22 0912   magnesium hydroxide (MILK OF MAGNESIA) suspension 30 mL  30 mL Oral Daily PRN Weber, Kyra A, NP   30 mL at 12/23/22 1352   melatonin tablet 3 mg  3 mg Oral QHS Nkwenti, Doris, NP   3 mg at 12/29/22 2007   mometasone-formoterol (DULERA) 200-5 MCG/ACT inhaler 2 puff  2 puff Inhalation BID Ajibola, Ene A, NP   2 puff at 12/30/22 0744   OLANZapine (ZYPREXA) tablet 15 mg  15 mg Oral BID Rex Kras, MD   15 mg at 12/30/22 0746   ondansetron (ZOFRAN-ODT) disintegrating tablet 4 mg  4 mg Oral Q8H PRN Weber, Kyra A, NP   4 mg at 12/26/22 2224   pantoprazole (PROTONIX) EC tablet 40 mg  40 mg Oral QAC breakfast Weber, Kyra A, NP   40 mg at 12/30/22 0748   prazosin (MINIPRESS) capsule 1 mg  1 mg Oral QHS Weber, Kyra A, NP   1 mg at 12/29/22 1952   propafenone (RYTHMOL  SR) 12 hr capsule 225 mg  225 mg Oral BID Weber, Kyra A, NP   225 mg at 12/30/22 0746    Lab Results: No results found for this or any previous visit (from the past 48 hour(s)).  Blood Alcohol level:  Lab Results  Component Value Date   ETH <10 12/18/2022   ETH <10 12/14/2022    Metabolic Disorder Labs: Lab Results  Component Value Date   HGBA1C 5.4 10/22/2022   MPG 108 10/22/2022   MPG 103 04/20/2022   Lab Results  Component Value Date   PROLACTIN 20.0 12/14/2022   PROLACTIN 66.2 (H) 03/02/2021   Lab Results  Component Value Date   CHOL 188 12/14/2022   TRIG 222 (H) 12/14/2022   HDL 41 12/14/2022   CHOLHDL 4.6 12/14/2022   VLDL 44 (H) 12/14/2022   LDLCALC 103 (H) 12/14/2022   LDLCALC 117 (H) 10/22/2022    Physical Findings: AIMS:  , ,  ,  ,    CIWA:    COWS:     Musculoskeletal: Strength & Muscle Tone: within normal limits Gait & Station: normal Patient leans: N/A  Psychiatric Specialty Exam:  Presentation  General Appearance:  Disheveled; Casual  Eye Contact: Fair  Speech: Slurred; Garbled  Speech Volume: Normal  Handedness: Right   Mood and Affect  Mood: Depressed; Labile  Affect: Restricted   Thought Process  Thought Processes: Coherent  Descriptions of Associations:Tangential  Orientation:Full (Time, Place and Person)  Thought Content:Rumination; Perseveration  History of Schizophrenia/Schizoaffective disorder:Yes  Duration of Psychotic Symptoms:Greater than six months  Hallucinations:Hallucinations: Command   Ideas of Reference:Paranoia  Suicidal Thoughts:Suicidal Thoughts: Yes, Active SI Active Intent and/or Plan: With Intent; Without Plan  Homicidal Thoughts:Homicidal Thoughts: Yes, Passive   Sensorium  Memory: Immediate Fair; Remote Fair; Recent Fair  Judgment: Poor  Insight: Poor   Executive Functions  Concentration: Poor  Attention Span: Poor  Recall: Poor  Fund of  Knowledge: Poor  Language: Fair   Psychomotor Activity  Psychomotor Activity: Psychomotor Activity: Normal   Assets  Assets: Resilience; Leisure Time; Housing; Communication Skills   Sleep  Sleep: Sleep: Good Number of Hours of Sleep: 11    Physical Exam: Physical Exam Constitutional:      Appearance: She is obese.  Musculoskeletal:        General: Normal range of motion.  Neurological:     General: No focal deficit present.     Mental Status: She is alert and oriented to person, place, and time. Mental status is at baseline.  Psychiatric:        Mood and Affect: Mood normal.        Behavior: Behavior normal.    Review of Systems  Psychiatric/Behavioral:  Positive for depression and hallucinations. Negative for memory loss, substance abuse and suicidal ideas. The patient is nervous/anxious and has insomnia.   All other systems reviewed and are negative.  Blood pressure 120/69, pulse (!) 120, temperature 97.8 F (36.6 C), resp. rate 18, height 5\' 4"  (1.626 m), weight 112.5 kg, SpO2 99%. Body mass index is 42.57 kg/m.  Treatment Plan Summary: Daily contact with patient to assess and evaluate symptoms and progress in treatment and Medication management   Safety and Monitoring: Voluntary admission to inpatient psychiatric unit for safety, stabilization and treatment Daily contact with patient to assess and evaluate symptoms and progress in treatment Patient's case to be discussed in multi-disciplinary team meeting Observation Level : q15 minute checks Vital signs: q12 hours Precautions: Safety   Long Term Goal(s): Improvement in symptoms so as ready for discharge   Short Term Goals: Ability to identify changes in lifestyle to reduce recurrence of condition will improve, Ability to verbalize feelings will improve, Ability to disclose and discuss suicidal ideas, Ability to demonstrate self-control will improve, Ability to identify and develop effective coping  behaviors will improve, Ability to maintain clinical measurements within normal limits will improve, and Compliance with prescribed medications will improve   Diagnoses Principal Problem:   Schizoaffective disorder, bipolar type (HCC) Active Problems:   Borderline personality disorder (HCC)   Asperger syndrome   PTSD (post-traumatic stress disorder)   Asthma   Self-injurious behavior   Fibromyalgia   Medications -Continue Melatonin 3 mg nightly for sleep  -Increase Zyprexa 15 mg twice a day 15 mg nightly for psychosis and mood stabilization -She is on gabapentin 600 p.o. 3 times daily. -Increase Cymbalta to 90 mg mg daily for fibromyalgia and depressive symptoms -Continue Protonix 40 mg daily for GERD -Continue prazosin 1 mg nightly for nightmares -Continue propafenone to 25 mg twice daily for cardiac arrhythmia -Continue agitation protocol medications as per the MAR    Other PRNS -Continue Tylenol 650 mg every 6 hours PRN for mild pain -Continue Maalox 30 mg every 4 hrs PRN for indigestion -Continue Milk of Magnesia as needed every 6 hrs for constipation   Labs reviewed: QTc is 513, repeat EKG on 11/19 not completed, reordered.   Discharge Planning: Social work and case management to assist with discharge planning and identification of hospital follow-up needs prior to discharge  Estimated LOS: 5-7 days consider referral to [facility]. Discharge Concerns: Need to establish a safety plan; Medication compliance and effectiveness Discharge Goals: Return home with outpatient referrals for mental health follow-up including medication management/psychotherapy   I certify that inpatient services furnished can reasonably be expected to improve the patient's condition.    Rex Kras, MD 12/30/2022, 11:16 AM Patient ID: Madelyn Flavors, female   DOB: 14-Feb-1990, 32 y.o.   MRN: [id number]  Patient ID: Zayla Buxbaum, female   DOB: [birth year], 31 y.o.    MRN: [id number]

## 2022-12-30 NOTE — [facility] Group Notes (Signed)
[facility] Group Notes:  (Nursing/MHT/Case Management/Adjunct)  Date:  12/30/2022  Time: 2000   Type of Therapy:   wrap up group  Participation Level:  Active  Participation Quality:  Appropriate, Attentive, and Sharing  Affect:  Anxious and Appropriate  Cognitive:  Alert and Appropriate  Insight:  Appropriate  Engagement in Group:  Engaged and Supportive  Modes of Intervention:  Clarification  Summary of Progress/Problems:Pt stated that at the beginning of the day she were hearing voices. Pt stated that her day were rated a 7. Pt stated that she wants to work on been moved back to the [employer] and also work on her discharge plan.  [name] 12/30/2022, 8:06 PM

## 2022-12-30 NOTE — Group Note (Signed)
Date:  12/30/2022 Time:  9:22 AM  Group Topic/Focus:  Goals Group:   The focus of this group is to help patients establish daily goals to achieve during treatment and discuss how the patient can incorporate goal setting into their daily lives to aide in recovery. Orientation:   The focus of this group is to educate the patient on the purpose and policies of crisis stabilization and provide a format to answer questions about their admission.  The group details unit policies and expectations of patients while admitted.    Participation Level:  Active  Participation Quality:  Appropriate  Affect:  Appropriate  Cognitive:  Appropriate  Insight: Appropriate  Engagement in Group:  Engaged  Modes of Intervention:  Discussion  Additional Comments:  Pt's goal is to "See if I can get off 1:1 by staying safe. Pt said her meds are now regulated.   Donell Beers 12/30/2022, 9:22 AM

## 2022-12-30 NOTE — Plan of Care (Signed)
Problem: Education: Goal: Emotional status will improve Outcome: Progressing   Problem: Coping: Goal: Ability to verbalize frustrations and anger appropriately will improve Outcome: Progressing

## 2022-12-30 NOTE — Progress Notes (Signed)
1:1 Note: Patient sitting in Dayroom attending group therapy at this time. No distress noted. Pt tolerated AM medication well. Vistaril 25 mg po administered for complaints of anxiety with effective results. 1: 1 observation continues. Safety maintained.

## 2022-12-30 NOTE — Plan of Care (Signed)
  Problem: Education: Goal: Mental status will improve Outcome: Progressing   Problem: Education: Goal: Emotional status will improve Outcome: Progressing   Problem: Activity: Goal: Sleeping patterns will improve Outcome: Progressing   Problem: Coping: Goal: Ability to verbalize frustrations and anger appropriately will improve Outcome: Progressing Goal: Ability to demonstrate self-control will improve Outcome: Progressing   Problem: Safety: Goal: Periods of time without injury will increase Outcome: Progressing

## 2022-12-30 NOTE — Progress Notes (Signed)
1:1 Note: Patient lying in bed resting with eyes closed at this time. Respiration easy and unlabored. Patient remains safe.

## 2022-12-30 NOTE — Group Note (Unsigned)
Date:  12/30/2022 Time:  8:29 PM  Group Topic/Focus:  Group Topic: @GROUPTOPIC @  Group Date: @GROUPDATE @ Start Time: @GROUPSTARTTIME @ End Time: @GROUPENDTIME @ Facilitators: @GROUPFACIL @  Department: @GROUPDEP @              Alfonse Ras     Participation Level:  {[facility] PARTICIPATION VHQIO:96295}  Participation Quality:  {[facility] PARTICIPATION QUALITY:22265}  Affect:  {[facility] AFFECT:22266}  Cognitive:  {[facility] COGNITIVE:22267}  Insight: {B[facility] Insight2:20797}  Engagement in Group:  {B[facility] ENGAGEMENT IN MWUXL:24401}  Modes of Intervention:  {B[facility] MODES OF INTERVENTION:22269}  Additional Comments:  ***  Alfonse Ras 12/30/2022, 8:29 PM

## 2022-12-30 NOTE — Progress Notes (Signed)
D.  Pt has appeared to sleep most of night with snoring respirations.  No distress noted.  A.  1:1 continued as ordered for Pt safety  R.  Pt remains safe on the unit

## 2022-12-30 NOTE — Progress Notes (Signed)
Patient ID: [name], female   DOB: Jul 16, 1990, 32 y.o.   MRN: [id number]   Pt reports increased anxiety at 7/10 and requested medication. Pt was given Hydroxyzine 25 mg PRN.

## 2022-12-30 NOTE — Progress Notes (Signed)
1:1 Note: Patient noted to be in room conversing well with MHT. Ibuprofen 600 mg po administered for rt hip pain with effective result. 1:1 in progress. Safety maintained.

## 2022-12-30 NOTE — Progress Notes (Signed)
   12/30/22 2106  Psych Admission Type (Psych Patients Only)  Admission Status Voluntary  Psychosocial Assessment  Patient Complaints Anxiety;Nervousness  Eye Contact Fair  Facial Expression Anxious;Animated  Affect Apprehensive;Irritable;Preoccupied  Furniture conservator/restorer;Restless  Appearance/Hygiene Unremarkable  Behavior Characteristics Appropriate to situation  Mood Anxious;Irritable;Preoccupied  Thought Process  Coherency Disorganized  Content Blaming others  Delusions None reported or observed  Perception WDL  Hallucination None reported or observed  Judgment Poor  Confusion None  Danger to Self  Current suicidal ideation? Passive  Description of Suicide Plan denies  Self-Injurious Behavior No self-injurious ideation or behavior indicators observed or expressed   Agreement Not to Harm Self Yes  Description of Agreement verbal  Danger to Others  Danger to Others None reported or observed  Danger to Others Abnormal  Harmful Behavior to others No threats or harm toward other people  Destructive Behavior No threats or harm toward property

## 2022-12-30 NOTE — Progress Notes (Signed)
1:1 Note: Patient up in Dayroom at this time receiving a snack. Pt stated to writer, " I've been hearing the voices but I'm trying to block them out". Safety maintained.

## 2022-12-31 ENCOUNTER — Encounter (HOSPITAL_COMMUNITY): Payer: Self-pay

## 2022-12-31 DIAGNOSIS — F250 Schizoaffective disorder, bipolar type: Secondary | ICD-10-CM | POA: Diagnosis not present

## 2022-12-31 NOTE — Progress Notes (Signed)
   12/31/22 0900  Psych Admission Type (Psych Patients Only)  Admission Status Voluntary  Psychosocial Assessment  Patient Complaints Anxiety  Eye Contact Fair  Facial Expression Anxious;Flat  Affect Anxious;Depressed;Preoccupied  Speech Logical/coherent  Interaction Assertive  Motor Activity Restless;Fidgety  Appearance/Hygiene Unremarkable  Behavior Characteristics Appropriate to situation  Mood Anxious;Preoccupied  Thought Process  Coherency Disorganized  Content Blaming others  Delusions None reported or observed  Perception WDL  Hallucination None reported or observed  Judgment Poor  Confusion None  Danger to Self  Current suicidal ideation? Denies  Self-Injurious Behavior No self-injurious ideation or behavior indicators observed or expressed   Agreement Not to Harm Self Yes  Description of Agreement verbal  Danger to Others  Danger to Others None reported or observed  Danger to Others Abnormal  Harmful Behavior to others No threats or harm toward other people  Destructive Behavior No threats or harm toward property   Patient remains on 1:1 with sitter.

## 2022-12-31 NOTE — Progress Notes (Signed)
1:1 note  Patient in hallway talking to this RN. Talking about how to get off 1:1 and plans for discharge. Patient informed she will be stepping down to 300 or 400 hall today. Patient calm and cooperative. No distress noted.   1:1 continued for safety.  Safety maintained. Will continue to monitor.

## 2022-12-31 NOTE — Group Note (Unsigned)
Date:  12/31/2022 Time:  9:55 AM  Group Topic/Focus:  Goals Group:   The focus of this group is to help patients establish daily goals to achieve during treatment and discuss how the patient can incorporate goal setting into their daily lives to aide in recovery.     Participation Level:  {B[facility] PARTICIPATION MVHQI:69629}  Participation Quality:  {B[facility] PARTICIPATION QUALITY:22265}  Affect:  {B[facility] AFFECT:22266}  Cognitive:  {B[facility] COGNITIVE:22267}  Insight: {BHH Insight2:20797}  Engagement in Group:  {B[facility] ENGAGEMENT IN BMWUX:32440}  Modes of Intervention:  {B[facility] MODES OF INTERVENTION:22269}  Additional Comments:  ***  [name] 12/31/2022, 9:55 AM

## 2022-12-31 NOTE — Progress Notes (Signed)
   12/31/22 0530  15 Minute Checks  Location Bedroom  Visual Appearance Calm  Behavior Sleeping  Sleep (Behavioral Health Patients Only)  Calculate sleep? (Click Yes once per 24 hr at 0600 safety check) Yes  Documented sleep last 24 hours 9.5

## 2022-12-31 NOTE — Progress Notes (Signed)
[facility] services present on unit today to meet with pt regarding Partial Hospitalization Program (PHP). ACTT representative reported that they are able to provide PHP and step her down to ACTT. PHP service will last 3-4 months.  Pt in agreement with service.

## 2022-12-31 NOTE — [facility] Group Notes (Signed)
Adult Psychoeducational Group Note  Date:  12/31/2022 Time:  9:40 AM  Group Topic/Focus:  Goals Group:   The focus of this group is to help patients establish daily goals to achieve during treatment and discuss how the patient can incorporate goal setting into their daily lives to aide in recovery.  Participation Level:  Active  Participation Quality:  Appropriate  Affect:  Appropriate  Cognitive:  Appropriate  Insight: Appropriate  Engagement in Group:  Engaged  Modes of Intervention:  Discussion  Additional Comments:  The patient engagaed in group  Octavio Manns 12/31/2022, 9:40 AM

## 2022-12-31 NOTE — BH IP Treatment Plan (Signed)
Interdisciplinary Treatment and Diagnostic Plan Update  12/31/2022 Time of Session: 12:00PM - UPDATE [name] MRN: [id number]  Principal Diagnosis: Schizoaffective disorder, bipolar type (HCC)  Secondary Diagnoses: Principal Problem:   Schizoaffective disorder, bipolar type (HCC) Active Problems:   Borderline personality disorder (HCC)   Asperger syndrome   PTSD (post-traumatic stress disorder)   Asthma   Self-injurious behavior   Fibromyalgia   Prolonged QT interval   Current Medications:  Current Facility-Administered Medications  Medication Dose Route Frequency Provider Last Rate Last Admin   albuterol (PROVENTIL) (2.5 MG/3ML) 0.083% nebulizer solution 2.5 mg  2.5 mg Nebulization Q6H PRN Weber, Kyra A, NP       alum & mag hydroxide-simeth (MAALOX/MYLANTA) 200-200-20 MG/5ML suspension 30 mL  30 mL Oral Q4H PRN Weber, Bella Kennedy A, NP       bacitracin ointment   Topical BID Starleen Blue, NP   Given at 12/31/22 0817   diphenhydrAMINE (BENADRYL) capsule 50 mg  50 mg Oral TID PRN Phebe Colla A, NP   50 mg at 12/26/22 2320   Or   diphenhydrAMINE (BENADRYL) injection 50 mg  50 mg Intramuscular TID PRN Weber, Bella Kennedy A, NP   50 mg at 12/27/22 0912   DULoxetine (CYMBALTA) DR capsule 90 mg  90 mg Oral Daily Rex Kras, MD   90 mg at 12/31/22 0813   gabapentin (NEURONTIN) capsule 600 mg  600 mg Oral TID Rex Kras, MD   600 mg at 12/31/22 1358   hydrOXYzine (ATARAX) tablet 25 mg  25 mg Oral TID PRN Starleen Blue, NP   25 mg at 12/31/22 0932   ibuprofen (ADVIL) tablet 600 mg  600 mg Oral Q8H PRN Weber, Kyra A, NP   600 mg at 12/31/22 0116   LORazepam (ATIVAN) tablet 2 mg  2 mg Oral TID PRN Phebe Colla A, NP   2 mg at 12/26/22 2320   Or   LORazepam (ATIVAN) injection 2 mg  2 mg Intramuscular TID PRN Phebe Colla A, NP   2 mg at 12/27/22 0912   magnesium hydroxide (MILK OF MAGNESIA) suspension 30 mL  30 mL Oral Daily PRN Weber, Kyra A, NP   30 mL at 12/23/22 1352    melatonin tablet 3 mg  3 mg Oral QHS Nkwenti, Doris, NP   3 mg at 12/30/22 2018   mometasone-formoterol (DULERA) 200-5 MCG/ACT inhaler 2 puff  2 puff Inhalation BID Ajibola, Ene A, NP   2 puff at 12/31/22 0817   OLANZapine (ZYPREXA) tablet 15 mg  15 mg Oral BID Rex Kras, MD   15 mg at 12/31/22 0814   ondansetron (ZOFRAN-ODT) disintegrating tablet 4 mg  4 mg Oral Q8H PRN Weber, Kyra A, NP   4 mg at 12/31/22 1357   pantoprazole (PROTONIX) EC tablet 40 mg  40 mg Oral QAC breakfast Weber, Kyra A, NP   40 mg at 12/31/22 0616   prazosin (MINIPRESS) capsule 1 mg  1 mg Oral QHS Weber, Kyra A, NP   1 mg at 12/30/22 2018   propafenone (RYTHMOL SR) 12 hr capsule 225 mg  225 mg Oral BID Weber, Kyra A, NP   225 mg at 12/31/22 0813   PTA Medications: Medications Prior to Admission  Medication Sig Dispense Refill Last Dose   albuterol (VENTOLIN HFA) 108 (90 Base) MCG/ACT inhaler Inhale 2 puffs into the lungs every 6 (six) hours as needed for wheezing or shortness of breath. 1 each 0    ARIPiprazole (ABILIFY) 2 MG  tablet Take 1 tablet (2 mg total) by mouth daily for 7 days. 7 tablet 0    Bacitracin-Polymyxin B (NEOSPORIN EX) Apply 1 Application topically as needed (Cuts).      Calcium Carbonate Antacid (TUMS PO) Take 2 tablets by mouth as needed (Heartburn).      cyclobenzaprine (FLEXERIL) 10 MG tablet Take 10 mg by mouth at bedtime.      diclofenac Sodium (VOLTAREN) 1 % GEL Apply 2 g topically 4 (four) times daily. (Patient taking differently: Apply 2 g topically in the morning, at noon, and at bedtime.)      DULoxetine (CYMBALTA) 60 MG capsule Take 1 capsule (60 mg total) by mouth daily for 7 days. 7 capsule 0    gabapentin (NEURONTIN) 600 MG tablet Take 600 mg by mouth 3 (three) times daily.      hydrOXYzine (ATARAX) 25 MG tablet Take 25 mg by mouth 4 (four) times daily as needed for anxiety.      mometasone-formoterol (DULERA) 200-5 MCG/ACT AERO Inhale 2 puffs into the lungs 2 (two) times daily. 1  each 0    OLANZapine (ZYPREXA) 15 MG tablet Take 15 mg by mouth at bedtime.      OLANZapine (ZYPREXA) 5 MG tablet Take 3 tablets (15 mg total) by mouth at bedtime for 7 days. (Patient taking differently: Take 5 mg by mouth in the morning and at bedtime. Take 1 tablet at 1200 and 1700) 21 tablet 0    ondansetron (ZOFRAN-ODT) 4 MG disintegrating tablet Take 1 tablet (4 mg total) by mouth every 8 (eight) hours as needed for nausea or vomiting. 10 tablet 0    pantoprazole (PROTONIX) 40 MG tablet Take 1 tablet (40 mg total) by mouth daily before breakfast for 7 days. 7 tablet 0    prazosin (MINIPRESS) 1 MG capsule Take 1 capsule (1 mg total) by mouth at bedtime for 7 days. 7 capsule 0    propafenone (RYTHMOL SR) 225 MG 12 hr capsule Take 225 mg by mouth 2 (two) times daily.       Patient Stressors:    Patient Strengths:    Treatment Modalities: Medication Management, Group therapy, Case management,  1 to 1 session with clinician, Psychoeducation, Recreational therapy.   Physician Treatment Plan for Primary Diagnosis: Schizoaffective disorder, bipolar type (HCC) Long Term Goal(s): Improvement in symptoms so as ready for discharge   Short Term Goals: Ability to identify changes in lifestyle to reduce recurrence of condition will improve Ability to verbalize feelings will improve Ability to disclose and discuss suicidal ideas Ability to demonstrate self-control will improve Ability to identify and develop effective coping behaviors will improve Ability to maintain clinical measurements within normal limits will improve Compliance with prescribed medications will improve  Medication Management: Evaluate patient's response, side effects, and tolerance of medication regimen.  Therapeutic Interventions: 1 to 1 sessions, Unit Group sessions and Medication administration.  Evaluation of Outcomes: Progressing  Physician Treatment Plan for Secondary Diagnosis: Principal Problem:   Schizoaffective  disorder, bipolar type (HCC) Active Problems:   Borderline personality disorder (HCC)   Asperger syndrome   PTSD (post-traumatic stress disorder)   Asthma   Self-injurious behavior   Fibromyalgia   Prolonged QT interval  Long Term Goal(s): Improvement in symptoms so as ready for discharge   Short Term Goals: Ability to identify changes in lifestyle to reduce recurrence of condition will improve Ability to verbalize feelings will improve Ability to disclose and discuss suicidal ideas Ability to demonstrate self-control will improve  Ability to identify and develop effective coping behaviors will improve Ability to maintain clinical measurements within normal limits will improve Compliance with prescribed medications will improve     Medication Management: Evaluate patient's response, side effects, and tolerance of medication regimen.  Therapeutic Interventions: 1 to 1 sessions, Unit Group sessions and Medication administration.  Evaluation of Outcomes: Progressing   RN Treatment Plan for Primary Diagnosis: Schizoaffective disorder, bipolar type (HCC) Long Term Goal(s): Knowledge of disease and therapeutic regimen to maintain health will improve  Short Term Goals: Ability to remain free from injury will improve, Ability to verbalize frustration and anger appropriately will improve, Ability to participate in decision making will improve, Ability to disclose and discuss suicidal ideas, Ability to identify and develop effective coping behaviors will improve, and Compliance with prescribed medications will improve  Medication Management: RN will administer medications as ordered by provider, will assess and evaluate patient's response and provide education to patient for prescribed medication. RN will report any adverse and/or side effects to prescribing provider.  Therapeutic Interventions: 1 on 1 counseling sessions, Psychoeducation, Medication administration, Evaluate responses to  treatment, Monitor vital signs and CBGs as ordered, Perform/monitor CIWA, COWS, AIMS and Fall Risk screenings as ordered, Perform wound care treatments as ordered.  Evaluation of Outcomes: Progressing   LCSW Treatment Plan for Primary Diagnosis: Schizoaffective disorder, bipolar type (HCC) Long Term Goal(s): Safe transition to appropriate next level of care at discharge, Engage patient in therapeutic group addressing interpersonal concerns.  Short Term Goals: Engage patient in aftercare planning with referrals and resources, Increase social support, Increase emotional regulation, Facilitate acceptance of mental health diagnosis and concerns, Identify triggers associated with mental health/substance abuse issues, and Increase skills for wellness and recovery  Therapeutic Interventions: Assess for all discharge needs, 1 to 1 time with Social worker, Explore available resources and support systems, Assess for adequacy in community support network, Educate family and significant other(s) on suicide prevention, Complete Psychosocial Assessment, Interpersonal group therapy.  Evaluation of Outcomes: Progressing   Progress in Treatment: Attending groups: Yes. Participating in groups: Yes. Taking medication as prescribed: Yes. Toleration medication: Yes. Family/Significant other contact made: Yes, contacted:  [name], QP ([facility]) [phone] / Shanice therapist ([facility]) [phone] Patient understands diagnosis: Yes. Discussing patient identified problems/goals with staff: Yes. Medical problems stabilized or resolved: Yes. Denies suicidal/homicidal ideation: Yes. Issues/concerns per patient self-inventory: No.     New problem(s) identified: No, Describe:  None reported    New Short Term/Long Term Goal(s):   medication stabilization, elimination of SI thoughts, development of comprehensive mental wellness plan.     Patient Goals:  " Go home tomorrow, reach out to my ACT  team about bringing forms out to my house, and tell them that I do not want to speak to a female when I call there crisis line "    Discharge Plan or Barriers: Patient recently admitted. CSW will continue to follow and assess for appropriate referrals and possible discharge planning.     Reason for Continuation of Hospitalization: Anxiety Delusions  Depression Medication stabilization Suicidal ideation   Estimated Length of Stay: 2-4 days  Last 3 [place] Suicide Severity Risk Score: Flowsheet Row ED to Hosp-Admission (Current) [facility] from 12/18/2022 in [facility] ED from 12/17/2022 in [facility] Department at [facility]  C-SSRS RISK CATEGORY High Risk High Risk High Risk  Last PHQ 2/9 Scores:    03/10/2022   10:01 PM 03/10/2022    9:56 PM 02/10/2022    1:49 AM  Depression screen PHQ 2/9  Decreased Interest 1 1 2   Down, Depressed, Hopeless 1 1 2   PHQ - 2 Score 2 2 4   Altered sleeping 1 1 2   Tired, decreased energy 1 1 2   Change in appetite 1 0 2  Feeling bad or failure about yourself  1 1 2   Trouble concentrating 1 1 2   Moving slowly or fidgety/restless 1 1 1   Suicidal thoughts 1 1 1   PHQ-9 Score 9 8 16   Difficult doing work/chores Somewhat difficult  Very difficult    Scribe for Treatment Team: Esmeralda Arthur 12/31/2022 2:24 PM

## 2022-12-31 NOTE — [facility] Group Notes (Signed)
[facility] Group Notes:  (Nursing/MHT/Case Management/Adjunct)  Date:  12/31/2022  Time:  2000  Type of Therapy:   AA Group  Participation Level:  Active  Participation Quality:  Appropriate and Attentive  Affect:  Appropriate  Cognitive:  Alert and Appropriate  Insight:  Appropriate  Engagement in Group:  Engaged and Supportive  Modes of Intervention:  Education and Support  Summary of Progress/Problems:  Fay Records 12/31/2022, 9:48 PM

## 2022-12-31 NOTE — Group Note (Signed)
Recreation Therapy Group Note   Group Topic:Coping Skills  Group Date: 12/31/2022 Start Time: 1013 End Time: 1045 Facilitators: Mashell Sieben-McCall, LRT,CTRS Location: [address] Dayroom   Group Topic: Coping Skills  Goal Area(s) Addresses:  Patient will identify the importance of exercises. Patient will identify the things they use as coping skills.  Group Description:  LRT and patients discussed the importance of coping skills and how they could be used to help people when dealing with uneasy situations. Patients would then identify what they personally use as coping skills.  Education: Pharmacologist, Building control surveyor.   Education Outcome: Acknowledges understanding/In group clarification offered/Needs additional education.    Affect/Mood: Flat   Participation Level: Moderate   Participation Quality: Independent   Behavior: Appropriate   Speech/Thought Process: Focused   Insight: Moderate   Judgement: Moderate   Modes of Intervention: Open Conversation   Patient Response to Interventions:  Receptive   Education Outcome:  In group clarification offered    Clinical Observations/Individualized Feedback: Pt was quiet but engaged when prompted. Pt stated she listens to christian music to uplift her. Pt also identified diamond painting, taking baths and watching television as coping skills.    Plan: Continue to engage patient in RT group sessions 2-3x/week.   [name], LRT,CTRS 12/31/2022 1:07 PM

## 2022-12-31 NOTE — Plan of Care (Signed)
  Problem: Education: Goal: Emotional status will improve Outcome: Progressing   Problem: Education: Goal: Mental status will improve Outcome: Progressing   

## 2022-12-31 NOTE — Progress Notes (Signed)
D.  Pt has rested with eyes closed, snoring respirations most of night, up once to bathroom and to get pain medication for hip.  No acute distress of note.  A.  1:1 continued as ordered for Pt safety.    R.  Pt remains safe on the unit, will continue to monitor.

## 2022-12-31 NOTE — Progress Notes (Signed)
   12/31/22 0900  Psych Admission Type (Psych Patients Only)  Admission Status Voluntary  Psychosocial Assessment  Patient Complaints Anxiety  Eye Contact Fair  Facial Expression Anxious;Flat  Affect Anxious;Depressed;Preoccupied  Speech Logical/coherent  Interaction Assertive  Motor Activity Restless;Fidgety  Appearance/Hygiene Unremarkable  Behavior Characteristics Appropriate to situation  Mood Anxious;Preoccupied  Thought Process  Coherency Disorganized  Content Blaming others  Delusions None reported or observed  Perception WDL  Hallucination None reported or observed  Judgment Poor  Confusion None  Danger to Self  Current suicidal ideation? Denies  Self-Injurious Behavior No self-injurious ideation or behavior indicators observed or expressed   Agreement Not to Harm Self Yes  Description of Agreement verbal  Danger to Others  Danger to Others None reported or observed  Danger to Others Abnormal  Harmful Behavior to others No threats or harm toward other people  Destructive Behavior No threats or harm toward property

## 2022-12-31 NOTE — Progress Notes (Signed)
[name] MD Progress Note  12/31/2022 4:44 PM [name]  MRN:  [id number]  HPI: [name] is a 32 yo Caucasian female who is very familiar to the [facility] behavioral health service line with multiple hospitalizations over the past few months.  She is yet again admitted at this hospital voluntarily on 11/13 after she presented to the [facility] behavioral health urgent care Center initially via [employer] on 11/08 with complaints of SI with a plan to overdose on pills.  Patient was transferred for mental status.   24-hour chart review: Staff reports that the patient has been extremely labile and unpredictable.  She has been agitated and has remained on one-on-one observation but has managed to stay out of the seclusion room.  She slept 8 hours.  She remains less anxious and passively suicidal.  Today's assessment note: The patient was sitting in bed and was alert oriented and cooperative.  She maintained fair eye contact. Reports mood is better.  She was a little bit more communicative today stating that she lives with her friend and has an ACT team.  She is willing to be off the one-on-one observation during the daytime but she will be closely monitored because every time she has lack of attention she tends to act out. Current plan is to: Continue Zyprexa to 15 mg twice a day  Continue gabapentin to 600 p.o. 3 times daily. Continue rest of her medications as prescribed. Continue with one-on-one observation. Principal Problem: Schizoaffective disorder, bipolar type (HCC) Diagnosis: Principal Problem:   Schizoaffective disorder, bipolar type (HCC) Active Problems:   Borderline personality disorder (HCC)   Asperger syndrome   PTSD (post-traumatic stress disorder)   Asthma   Self-injurious behavior   Fibromyalgia   Prolonged QT interval  Total Time spent with patient: 45 minutes  Past Psychiatric History: See H & P  Past Medical History:  Past Medical History:  Diagnosis Date    Acid reflux    Adjustment disorder with mixed anxiety and depressed mood 01/31/2022   Adjustment disorder with mixed disturbance of emotions and conduct 08/03/2019   Anxiety    Asthma    last attack 03/13/15 or 03/14/15   Autism    Bipolar 1 disorder, depressed, severe (HCC) 07/25/2021   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    Chronic constipation    Depression    Drug-seeking behavior    Essential tremor    Headache    Ineffective individual coping 05/16/2022   Insomnia 01/12/2022   Intentional drug overdose (HCC) 06/05/2022   Neuromuscular disorder (HCC)    Normocytic anemia 06/05/2022   Overdose 07/22/2017   Overdose of acetaminophen 07/2017   and other meds   Overdose, intentional self-harm, initial encounter (HCC) 07/20/2021   Paranoia (HCC) 04/22/2021   Personality disorder (HCC)    Prolonged QT interval    Purposeful non-suicidal drug ingestion (HCC) 06/27/2021   Schizo-affective psychosis (HCC)    Schizoaffective disorder (HCC) 07/29/2022   Schizoaffective disorder, bipolar type (HCC)    Seizures (HCC)    Last seizure December 2017   Skin erythema 04/27/2022   Sleep apnea    Suicidal behavior 07/25/2021   Suicidal ideation    Suicide (HCC) 07/01/2021   Suicide attempt (HCC) 07/04/2021    Past Surgical History:  Procedure Laterality Date   MOUTH SURGERY  2009 or 2010   Family History:  Family History  Problem Relation Age of Onset   Mental illness Father    Asthma Father  PDD Brother    Seizures Brother    Family Psychiatric  History: See H & P Social History:  Social History   Substance and Sexual Activity  Alcohol Use No   Alcohol/week: 1.0 standard drink of alcohol   Types: 1 Standard drinks or equivalent per week   Comment: denies at this time     Social History   Substance and Sexual Activity  Drug Use No   Comment: History of cocaine use at age 44 for 4 months    Social History   Socioeconomic History   Marital status: Widowed    Spouse  name: Not on file   Number of children: 0   Years of education: Not on file   Highest education level: Not on file  Occupational History   Occupation: disability  Tobacco Use   Smoking status: Former    Types: Cigarettes   Smokeless tobacco: Never   Tobacco comments:    Smoked for 2  years age [age]  Vaping Use   Vaping status: Never Used  Substance and Sexual Activity   Alcohol use: No    Alcohol/week: 1.0 standard drink of alcohol    Types: 1 Standard drinks or equivalent per week    Comment: denies at this time   Drug use: No    Comment: History of cocaine use at age 33 for 4 months   Sexual activity: Not Currently    Birth control/protection: None  Other Topics Concern   Not on file  Social History Narrative   Marital status: Widowed      Children: daughter      Lives: with boyfriend, in two story home      Employment:  Disability      Tobacco: quit smoking; smoked for two years.      Alcohol ;none      Drugs: none   Has not traveled outside of the country.   Right handed         Patient with hx of Fibromyalgia,Orthostatic Tachycardia,Asthma,Arthritis,Gerd, ASD   Social Determinants of Health   Financial Resource Strain: Not on file  Food Insecurity: No Food Insecurity (12/25/2022)   Hunger Vital Sign    Worried About Running Out of Food in the Last Year: Never true    Ran Out of Food in the Last Year: Never true  Transportation Needs: No Transportation Needs (12/25/2022)   PRAPARE - Administrator, Civil Service (Medical): No    Lack of Transportation (Non-Medical): No  Physical Activity: Not on file  Stress: Not on file  Social Connections: Not on file   Sleep: Good  Appetite:  Fair  Current Medications: Current Facility-Administered Medications  Medication Dose Route Frequency Provider Last Rate Last Admin   albuterol (PROVENTIL) (2.5 MG/3ML) 0.083% nebulizer solution 2.5 mg  2.5 mg Nebulization Q6H PRN Weber, Kyra A, NP       alum & mag  hydroxide-simeth (MAALOX/MYLANTA) 200-200-20 MG/5ML suspension 30 mL  30 mL Oral Q4H PRN Weber, Bella Kennedy A, NP       bacitracin ointment   Topical BID Starleen Blue, NP   Given at 12/31/22 0817   diphenhydrAMINE (BENADRYL) capsule 50 mg  50 mg Oral TID PRN Phebe Colla A, NP   50 mg at 12/26/22 2320   Or   diphenhydrAMINE (BENADRYL) injection 50 mg  50 mg Intramuscular TID PRN Weber, Bella Kennedy A, NP   50 mg at 12/27/22 0912   DULoxetine (CYMBALTA) DR capsule 90 mg  90 mg Oral Daily Rex Kras, MD   90 mg at 12/31/22 0813   gabapentin (NEURONTIN) capsule 600 mg  600 mg Oral TID Rex Kras, MD   600 mg at 12/31/22 1358   hydrOXYzine (ATARAX) tablet 25 mg  25 mg Oral TID PRN Starleen Blue, NP   25 mg at 12/31/22 1623   ibuprofen (ADVIL) tablet 600 mg  600 mg Oral Q8H PRN Weber, Kyra A, NP   600 mg at 12/31/22 0116   LORazepam (ATIVAN) tablet 2 mg  2 mg Oral TID PRN Phebe Colla A, NP   2 mg at 12/26/22 2320   Or   LORazepam (ATIVAN) injection 2 mg  2 mg Intramuscular TID PRN Phebe Colla A, NP   2 mg at 12/27/22 0912   magnesium hydroxide (MILK OF MAGNESIA) suspension 30 mL  30 mL Oral Daily PRN Weber, Kyra A, NP   30 mL at 12/23/22 1352   melatonin tablet 3 mg  3 mg Oral QHS Nkwenti, Doris, NP   3 mg at 12/30/22 2018   mometasone-formoterol (DULERA) 200-5 MCG/ACT inhaler 2 puff  2 puff Inhalation BID Ajibola, Ene A, NP   2 puff at 12/31/22 0817   OLANZapine (ZYPREXA) tablet 15 mg  15 mg Oral BID Rex Kras, MD   15 mg at 12/31/22 0814   ondansetron (ZOFRAN-ODT) disintegrating tablet 4 mg  4 mg Oral Q8H PRN Weber, Kyra A, NP   4 mg at 12/31/22 1357   pantoprazole (PROTONIX) EC tablet 40 mg  40 mg Oral QAC breakfast Weber, Kyra A, NP   40 mg at 12/31/22 0616   prazosin (MINIPRESS) capsule 1 mg  1 mg Oral QHS Weber, Kyra A, NP   1 mg at 12/30/22 2018   propafenone (RYTHMOL SR) 12 hr capsule 225 mg  225 mg Oral BID Weber, Kyra A, NP   225 mg at 12/31/22 0813    Lab Results: No results found  for this or any previous visit (from the past 48 hour(s)).  Blood Alcohol level:  Lab Results  Component Value Date   ETH <10 12/18/2022   ETH <10 12/14/2022    Metabolic Disorder Labs: Lab Results  Component Value Date   HGBA1C 5.4 10/22/2022   MPG 108 10/22/2022   MPG 103 04/20/2022   Lab Results  Component Value Date   PROLACTIN 20.0 12/14/2022   PROLACTIN 66.2 (H) 03/02/2021   Lab Results  Component Value Date   CHOL 188 12/14/2022   TRIG 222 (H) 12/14/2022   HDL 41 12/14/2022   CHOLHDL 4.6 12/14/2022   VLDL 44 (H) 12/14/2022   LDLCALC 103 (H) 12/14/2022   LDLCALC 117 (H) 10/22/2022    Physical Findings: AIMS:  , ,  ,  ,    CIWA:    COWS:     Musculoskeletal: Strength & Muscle Tone: within normal limits Gait & Station: normal Patient leans: N/A  Psychiatric Specialty Exam:  Presentation  General Appearance:  Casual; Fairly Groomed; Appropriate for Environment  Eye Contact: Fair  Speech: Clear and Coherent  Speech Volume: Normal  Handedness: Right  Mood and Affect  Mood: Anxious; Depressed  Affect: Congruent   Thought Process  Thought Processes: Coherent  Descriptions of Associations:Tangential  Orientation:Full (Time, Place and Person)  Thought Content:Rumination  History of Schizophrenia/Schizoaffective disorder:Yes  Duration of Psychotic Symptoms:Greater than six months  Hallucinations:Hallucinations: None Description of Command Hallucinations: Denies Description of Auditory Hallucinations: Denies Description of Visual Hallucinations: Denies   Ideas  of Reference:Paranoia  Suicidal Thoughts:Suicidal Thoughts: No SI Active Intent and/or Plan: -- (Denies) SI Passive Intent and/or Plan: -- (Denies)  Homicidal Thoughts:Homicidal Thoughts: No HI Active Intent and/or Plan: -- (Denies) HI Passive Intent and/or Plan: -- (Denies)  Sensorium  Memory: Immediate Fair; Recent  Fair  Judgment: Poor  Insight: Poor  Art therapist  Concentration: Fair  Attention Span: Fair  Recall: [employer] of Knowledge: Fair  Language: Fair  Psychomotor Activity  Psychomotor Activity: Psychomotor Activity: Normal  Assets  Assets: Resilience; Physical Health  Sleep  Sleep: Sleep: Good Number of Hours of Sleep: 8  Physical Exam: Physical Exam Constitutional:      Appearance: She is obese.  HENT:     Head: Normocephalic.     Nose: Nose normal.     Mouth/Throat:     Mouth: Mucous membranes are moist.  Eyes:     Extraocular Movements: Extraocular movements intact.  Cardiovascular:     Rate and Rhythm: Tachycardia present.  Pulmonary:     Effort: Pulmonary effort is normal.  Abdominal:     Comments: Deferred  Genitourinary:    Comments: Deferred Musculoskeletal:        General: Normal range of motion.     Cervical back: Normal range of motion.  Neurological:     General: No focal deficit present.     Mental Status: She is alert and oriented to person, place, and time. Mental status is at baseline.  Psychiatric:        Mood and Affect: Mood normal.        Behavior: Behavior normal.    Review of Systems  Psychiatric/Behavioral:  Positive for depression. Negative for hallucinations, memory loss, substance abuse and suicidal ideas. The patient is nervous/anxious. The patient does not have insomnia.   All other systems reviewed and are negative.  Blood pressure 118/84, pulse (!) 115, temperature 98.3 F (36.8 C), temperature source Oral, resp. rate (!) 24, height 5\' 4"  (1.626 m), weight 112.5 kg, SpO2 100%. Body mass index is 42.57 kg/m.  Treatment Plan Summary: Daily contact with patient to assess and evaluate symptoms and progress in treatment and Medication management   Safety and Monitoring: Voluntary admission to inpatient psychiatric unit for safety, stabilization and treatment Daily contact with patient to assess and evaluate  symptoms and progress in treatment Patient's case to be discussed in multi-disciplinary team meeting Observation Level : q15 minute checks Vital signs: q12 hours Precautions: Safety   Long Term Goal(s): Improvement in symptoms so as ready for discharge   Short Term Goals: Ability to identify changes in lifestyle to reduce recurrence of condition will improve, Ability to verbalize feelings will improve, Ability to disclose and discuss suicidal ideas, Ability to demonstrate self-control will improve, Ability to identify and develop effective coping behaviors will improve, Ability to maintain clinical measurements within normal limits will improve, and Compliance with prescribed medications will improve   Diagnoses Principal Problem:   Schizoaffective disorder, bipolar type (HCC) Active Problems:   Borderline personality disorder (HCC)   Asperger syndrome   PTSD (post-traumatic stress disorder)   Asthma   Self-injurious behavior   Fibromyalgia   Medications -Continue Melatonin 3 mg nightly for sleep  -Continue Zyprexa 15 mg twice a day 15 mg nightly for psychosis and mood stabilization -She is on gabapentin 600 p.o. 3 times daily. -Continue Cymbalta to 90 mg mg daily for fibromyalgia and depressive symptoms -Continue Protonix 40 mg daily for GERD -Continue prazosin 1 mg nightly for nightmares -  Continue propafenone to 25 mg twice daily for cardiac arrhythmia -Continue agitation protocol medications as per the [facility]    Other PRNS -Continue Tylenol 650 mg every 6 hours PRN for mild pain -Continue Maalox 30 mg every 4 hrs PRN for indigestion -Continue Milk of Magnesia as needed every 6 hrs for constipation   Labs reviewed: QTc is 513, repeat EKG on 11/19 not completed, reordered.   Discharge Planning: Social work and case management to assist with discharge planning and identification of hospital follow-up needs prior to discharge Estimated LOS: 5-7 days consider referral to [facility]. Discharge Concerns: Need to establish a safety plan; Medication compliance and effectiveness Discharge Goals: Return home with outpatient referrals for mental health follow-up including medication management/psychotherapy   I certify that inpatient services furnished can reasonably be expected to improve the patient's condition.    Cecilie Lowers, FNP 12/31/2022, 4:44 PM Patient ID: [name], female   DOB: 09-Dec-1990, 32 y.o.   MRN: [id number]  Patient ID: [name], female   DOB: 1990/12/09, 32 y.o.   MRN: [id number] Patient ID: [name], female   DOB: 1990/10/03, 32 y.o.   MRN: [id number]

## 2022-12-31 NOTE — [facility] Group Notes (Signed)
Spiritual care group on grief and loss facilitated by Chaplain Dyanne Carrel, [facility]  Group Goal: Support / Education around grief and loss  Members engage in facilitated group support and psycho-social education.  Group Description:  Following introductions and group rules, group members engaged in facilitated group dialogue and support around topic of loss, with particular support around experiences of loss in their lives. Group Identified types of loss (relationships / self / things) and identified patterns, circumstances, and changes that precipitate losses. Reflected on thoughts / feelings around loss, normalized grief responses, and recognized variety in grief experience. Group encouraged individual reflection on safe space and on the coping skills that they are already utilizing.  Group drew on Adlerian / Rogerian and narrative framework  Patient Progress: [name] attended part of group, but left shortly after she arrived.  She shared about the loss of her child and her fight for custody.

## 2022-12-31 NOTE — Progress Notes (Signed)
D.   Pt resting with eyes closed, snoring respirations, no distress noted.    A.   1:1 continued as ordered for Pt safety.  R.    Pt remains safe, will continue to monitor.

## 2023-01-01 DIAGNOSIS — F250 Schizoaffective disorder, bipolar type: Secondary | ICD-10-CM | POA: Diagnosis not present

## 2023-01-01 MED ORDER — HYDROXYZINE HCL 50 MG PO TABS
50.0000 mg | ORAL_TABLET | Freq: Once | ORAL | Status: AC
  Administered 2023-01-01: 50 mg via ORAL
  Filled 2023-01-01 (×2): qty 1

## 2023-01-01 NOTE — Progress Notes (Signed)
[facility] MD Progress Note  01/01/2023 2:16 PM [name]  MRN:  [id number]  HPI: [name] is a 32 yo Caucasian female who is very familiar to the [facility] behavioral health service line with multiple hospitalizations over the past few months.  She is yet again admitted at this hospital voluntarily on 11/13 after she presented to the [facility] behavioral health urgent care Center initially via [facility] on 11/08 with complaints of SI with a plan to overdose on pills.  Patient was transferred for mental status.   24-hour chart review: Staff reports that the patient has been extremely labile and unpredictable.  Patient continued to remain on one-on-one observation but has managed to stay out of the seclusion room.  She slept 8 hours.  Nursing staff report patient was agitated last night and screaming out, however did not require Agitation protocol.  She remains less anxious.      .  Today's assessment note: On assessment today, patient is seen and examined sitting up in her room with a one-to-one monitoring staff in the room.  She is alert and oriented to person, place, time, and situation.  She reports screaming out last night because she heard her parents telling her that she harmed her child.  Patient reports she is not sure if this was a dream or auditory hallucination, then added, "I was able to calm myself down and go back to sleep."  She maintained fair eye contact with this provider.  Reports her mood is less depressed and rates depression as # 0/10, with 10 being high severity.  Reports anxiety as #2/10.  Affect appears pleasant today.  Continues to reiterate that she has an ACT team and plans to attend PHP after discharge from the hospital.  Reports willingness to be off the one-on-one observation during the daytime but she will be closely monitored because every time she has lack of attention she tends to act out.  Nursing staff report patient sleeping over 8 hours last night.  Reports  appetite is good, concentration much improved, and she has adequate energy.  Patient denies SI, or AVH.  However endorses HI.  When asked whom she plans to hurt, reports, my parents.  Made patient aware that it is this provider as rule to call his parents for duty to harm.  Patient immediately said, "No, I do not plan to harm them, I was just making that up."     Current plan is to: Continue Zyprexa to 15 mg twice a day  Continue gabapentin to 600 p.o. 3 times daily. Continue rest of her medications as prescribed. Continue with one-on-one observation.  Principal Problem: Schizoaffective disorder, bipolar type (HCC) Diagnosis: Principal Problem:   Schizoaffective disorder, bipolar type (HCC) Active Problems:   Borderline personality disorder (HCC)   Asperger syndrome   PTSD (post-traumatic stress disorder)   Asthma   Self-injurious behavior   Fibromyalgia   Prolonged QT interval  Total Time spent with patient: 35 minutes  Past Psychiatric History: See H & P  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    Chronic constipation  Depression    Drug-seeking behavior    Essential tremor    Headache    Ineffective individual coping 05/16/2022   Insomnia 01/12/2022   Intentional drug overdose (HCC) 06/05/2022   Neuromuscular disorder (HCC)    Normocytic anemia 06/05/2022   Overdose 07/22/2017   Overdose of acetaminophen 07/2017   and other meds   Overdose, intentional self-harm, initial encounter (HCC) 07/20/2021   Paranoia (HCC) 04/22/2021   Personality disorder (HCC)    Prolonged QT interval    Purposeful non-suicidal drug ingestion (HCC)  06/27/2021   Schizo-affective psychosis (HCC)    Schizoaffective disorder (HCC) 07/29/2022   Schizoaffective disorder, bipolar type (HCC)    Seizures (HCC)    Last seizure December 2017   Skin erythema 04/27/2022   Sleep apnea    Suicidal behavior 07/25/2021   Suicidal ideation    Suicide (HCC) 07/01/2021   Suicide attempt (HCC) 07/04/2021    Past Surgical History:  Procedure Laterality Date   MOUTH SURGERY  2009 or 2010   Family History:  Family History  Problem Relation Age of Onset   Mental illness Father    Asthma Father    PDD Brother    Seizures Brother    Family Psychiatric  History: See H & P Social History:  Social History   Substance and Sexual Activity  Alcohol Use No   Alcohol/week: 1.0 standard drink of alcohol   Types: 1 Standard drinks or equivalent per week   Comment: denies at this time     Social History   Substance and Sexual Activity  Drug Use No   Comment: History of cocaine use at age 37 for 4 months    Social History   Socioeconomic History   Marital status: Widowed    Spouse name: Not on file   Number of children: 0   Years of education: Not on file   Highest education level: Not on file  Occupational History   Occupation: disability  Tobacco Use   Smoking status: Former    Types: Cigarettes   Smokeless tobacco: Never   Tobacco comments:    Smoked for 2  years age [age]  Vaping Use   Vaping status: Never Used  Substance and Sexual Activity   Alcohol use: No    Alcohol/week: 1.0 standard drink of alcohol    Types: 1 Standard drinks or equivalent per week    Comment: denies at this time   Drug use: No    Comment: History of cocaine use at age 45 for 4 months   Sexual activity: Not Currently    Birth control/protection: None  Other Topics Concern   Not on file  Social History Narrative   Marital status: Widowed      Children: daughter      Lives: with boyfriend, in two story home      Employment:  Disability      Tobacco:  quit smoking; smoked for two years.      Alcohol ;none      Drugs: none   Has not traveled outside of the country.   Right handed         Patient with hx of Fibromyalgia,Orthostatic Tachycardia,Asthma,Arthritis,Gerd, ASD   Social Determinants of Health   Financial Resource Strain: Not on file  Food Insecurity: No Food Insecurity (12/25/2022)   Hunger Vital Sign    Worried About Running Out of Food in the Last Year: Never true    Ran Out of Food in the Last Year:  Never true  Transportation Needs: No Transportation Needs (12/25/2022)   PRAPARE - Administrator, Civil Service (Medical): No    Lack of Transportation (Non-Medical): No  Physical Activity: Not on file  Stress: Not on file  Social Connections: Not on file   Sleep: Good  Appetite:  Fair  Current Medications: Current Facility-Administered Medications  Medication Dose Route Frequency Provider Last Rate Last Admin   albuterol (PROVENTIL) (2.5 MG/3ML) 0.083% nebulizer solution 2.5 mg  2.5 mg Nebulization Q6H PRN Weber, Kyra A, NP       alum & mag hydroxide-simeth (MAALOX/MYLANTA) 200-200-20 MG/5ML suspension 30 mL  30 mL Oral Q4H PRN Weber, Bella Kennedy A, NP       bacitracin ointment   Topical BID Starleen Blue, NP   Given at 01/01/23 1308   diphenhydrAMINE (BENADRYL) capsule 50 mg  50 mg Oral TID PRN Phebe Colla A, NP   50 mg at 12/26/22 2320   Or   diphenhydrAMINE (BENADRYL) injection 50 mg  50 mg Intramuscular TID PRN Weber, Kyra A, NP   50 mg at 12/27/22 0912   DULoxetine (CYMBALTA) DR capsule 90 mg  90 mg Oral Daily Rex Kras, MD   90 mg at 01/01/23 0805   gabapentin (NEURONTIN) capsule 600 mg  600 mg Oral TID Rex Kras, MD   600 mg at 01/01/23 6578   hydrOXYzine (ATARAX) tablet 25 mg  25 mg Oral TID PRN Starleen Blue, NP   25 mg at 12/31/22 2053   ibuprofen (ADVIL) tablet 600 mg  600 mg Oral Q8H PRN Weber, Kyra A, NP   600 mg at 12/31/22 1658   LORazepam (ATIVAN) tablet 2 mg  2 mg Oral TID PRN  Phebe Colla A, NP   2 mg at 12/26/22 2320   Or   LORazepam (ATIVAN) injection 2 mg  2 mg Intramuscular TID PRN Weber, Bella Kennedy A, NP   2 mg at 12/27/22 0912   magnesium hydroxide (MILK OF MAGNESIA) suspension 30 mL  30 mL Oral Daily PRN Weber, Kyra A, NP   30 mL at 12/23/22 1352   melatonin tablet 3 mg  3 mg Oral QHS Nkwenti, Doris, NP   3 mg at 12/31/22 2053   mometasone-formoterol (DULERA) 200-5 MCG/ACT inhaler 2 puff  2 puff Inhalation BID Ajibola, Ene A, NP   2 puff at 01/01/23 0805   OLANZapine (ZYPREXA) tablet 15 mg  15 mg Oral BID Rex Kras, MD   15 mg at 01/01/23 0806   ondansetron (ZOFRAN-ODT) disintegrating tablet 4 mg  4 mg Oral Q8H PRN Weber, Kyra A, NP   4 mg at 12/31/22 1357   pantoprazole (PROTONIX) EC tablet 40 mg  40 mg Oral QAC breakfast Weber, Kyra A, NP   40 mg at 01/01/23 0617   prazosin (MINIPRESS) capsule 1 mg  1 mg Oral QHS Weber, Kyra A, NP   1 mg at 12/31/22 2053   propafenone (RYTHMOL SR) 12 hr capsule 225 mg  225 mg Oral BID Weber, Kyra A, NP   225 mg at 01/01/23 0805    Lab Results: No results found for this or any previous visit (from the past 48 hour(s)).  Blood Alcohol level:  Lab Results  Component Value Date   [facility] <10 12/18/2022   ETH <10 12/14/2022   Metabolic Disorder Labs: Lab Results  Component Value Date   HGBA1C 5.4 10/22/2022   MPG 108 10/22/2022   MPG 103 04/20/2022   Lab Results  Component  Value Date   PROLACTIN 20.0 12/14/2022   PROLACTIN 66.2 (H) 03/02/2021   Lab Results  Component Value Date   CHOL 188 12/14/2022   TRIG 222 (H) 12/14/2022   HDL 41 12/14/2022   CHOLHDL 4.6 12/14/2022   VLDL 44 (H) 12/14/2022   LDLCALC 103 (H) 12/14/2022   LDLCALC 117 (H) 10/22/2022    Physical Findings: AIMS:  , ,  ,  ,    CIWA:    COWS:     Musculoskeletal: Strength & Muscle Tone: within normal limits Gait & Station: normal Patient leans: N/A  Psychiatric Specialty Exam:  Presentation  General Appearance:  Appropriate for  Environment; Casual; Fairly Groomed  Eye Contact: Fair  Speech: Clear and Coherent  Speech Volume: Normal  Handedness: Right  Mood and Affect  Mood: Anxious  Affect: Congruent  Thought Process  Thought Processes: Coherent; Linear  Descriptions of Associations:Tangential  Orientation:Full (Time, Place and Person)  Thought Content:Rumination  History of Schizophrenia/Schizoaffective disorder:Yes  Duration of Psychotic Symptoms:Greater than six months  Hallucinations:Hallucinations: None Description of Command Hallucinations: Denies Description of Auditory Hallucinations: Denies Description of Visual Hallucinations: Denies   Ideas of Reference:Paranoia  Suicidal Thoughts:Suicidal Thoughts: No SI Active Intent and/or Plan: -- (Denies) SI Passive Intent and/or Plan: -- (Denies)  Homicidal Thoughts:Homicidal Thoughts: Yes, Passive HI Active Intent and/or Plan: Without Intent; Without Plan; Without Means to Carry Out HI Passive Intent and/or Plan: Without Intent; Without Plan; Without Access to Means  Sensorium  Memory: Immediate Fair; Recent Fair  Judgment: Poor  Insight: Poor  Executive Functions  Concentration: Fair  Attention Span: Fair  Recall: [employer] of Knowledge: Fair  Language: Fair  Psychomotor Activity  Psychomotor Activity: Psychomotor Activity: Normal  Assets  Assets: Resilience  Sleep  Sleep: Sleep: Good Number of Hours of Sleep: 8  Physical Exam: Physical Exam Constitutional:      Appearance: She is obese.  HENT:     Head: Normocephalic.     Nose: Nose normal.     Mouth/Throat:     Mouth: Mucous membranes are moist.  Eyes:     Extraocular Movements: Extraocular movements intact.  Cardiovascular:     Rate and Rhythm: Tachycardia present.  Pulmonary:     Effort: Pulmonary effort is normal.  Abdominal:     Comments: Deferred  Genitourinary:    Comments: Deferred Musculoskeletal:        General:  Normal range of motion.     Cervical back: Normal range of motion.  Neurological:     General: No focal deficit present.     Mental Status: She is alert and oriented to person, place, and time. Mental status is at baseline.  Psychiatric:        Mood and Affect: Mood normal.        Behavior: Behavior normal.    Review of Systems  Constitutional:  Negative for chills and fever.  HENT:  Negative for sore throat.   Eyes:  Negative for blurred vision.  Respiratory:  Negative for cough, sputum production, shortness of breath and wheezing.   Cardiovascular:  Negative for chest pain and palpitations.  Gastrointestinal:  Negative for abdominal pain, diarrhea, heartburn, nausea and vomiting.  Genitourinary:  Negative for dysuria, frequency and urgency.  Musculoskeletal: Negative.   Skin:  Negative for itching and rash.  Neurological:  Negative for dizziness, tingling, tremors, sensory change and headaches.  Endo/Heme/Allergies:        See allergy listing  Psychiatric/Behavioral:  Positive for depression.  Negative for hallucinations, memory loss, substance abuse and suicidal ideas. The patient is nervous/anxious. The patient does not have insomnia.   All other systems reviewed and are negative.  Blood pressure (!) 141/79, pulse (!) 116, temperature 98.3 F (36.8 C), temperature source Oral, resp. rate (!) 24, height 5\' 4"  (1.626 m), weight 112.5 kg, SpO2 100%. Body mass index is 42.57 kg/m.  Treatment Plan Summary: Daily contact with patient to assess and evaluate symptoms and progress in treatment and Medication management   Safety and Monitoring: Voluntary admission to inpatient psychiatric unit for safety, stabilization and treatment Daily contact with patient to assess and evaluate symptoms and progress in treatment Patient's case to be discussed in multi-disciplinary team meeting Observation Level : q15 minute checks Vital signs: q12 hours Precautions: Safety   Long Term Goal(s):  Improvement in symptoms so as ready for discharge   Short Term Goals: Ability to identify changes in lifestyle to reduce recurrence of condition will improve, Ability to verbalize feelings will improve, Ability to disclose and discuss suicidal ideas, Ability to demonstrate self-control will improve, Ability to identify and develop effective coping behaviors will improve, Ability to maintain clinical measurements within normal limits will improve, and Compliance with prescribed medications will improve   Diagnoses Principal Problem:   Schizoaffective disorder, bipolar type (HCC) Active Problems:   Borderline personality disorder (HCC)   Asperger syndrome   PTSD (post-traumatic stress disorder)   Asthma   Self-injurious behavior   Fibromyalgia   Medications -Continue Melatonin 3 mg nightly for sleep  -Continue Zyprexa 15 mg twice a day 15 mg nightly for psychosis and mood stabilization -She is on gabapentin 600 p.o. 3 times daily. -Continue Cymbalta to 90 mg mg daily for fibromyalgia and depressive symptoms -Continue Protonix 40 mg daily for GERD -Continue prazosin 1 mg nightly for nightmares -Continue propafenone to 25 mg twice daily for cardiac arrhythmia -Continue agitation protocol medications as per the MAR    Other PRNS -Continue Tylenol 650 mg every 6 hours PRN for mild pain -Continue Maalox 30 mg every 4 hrs PRN for indigestion -Continue Milk of Magnesia as needed every 6 hrs for constipation   Labs reviewed: QTc is 513, repeat EKG on 11/19 not completed, reordered.   Discharge Planning: Social work and case management to assist with discharge planning and identification of hospital follow-up needs prior to discharge Estimated LOS: 5-7 days consider referral to [facility]. Discharge Concerns: Need to establish a safety plan; Medication compliance and effectiveness Discharge Goals: Return home with outpatient referrals for mental health follow-up including  medication management/psychotherapy   I certify that inpatient services furnished can reasonably be expected to improve the patient's condition.    Cecilie Lowers, FNP 01/01/2023, 2:16 PM Patient ID: Madelyn Flavors, female   DOB: 1990-08-04, 32 y.o.   MRN: [id number]  Patient ID: Rachyl Edsall, female   DOB: 05/27/1990, 32 y.o.   MRN: [id number] Patient ID: Berklee Charvat, female   DOB: Mar 01, 1990, 32 y.o.   MRN: [id number] Patient ID: Tanis Brecker, female   DOB: Dec 08, 1990, 32 y.o.   MRN: [id number]

## 2023-01-01 NOTE — Plan of Care (Signed)
?  Problem: Education: ?Goal: Mental status will improve ?Outcome: Progressing ?Goal: Verbalization of understanding the information provided will improve ?Outcome: Progressing ?  ?

## 2023-01-01 NOTE — Progress Notes (Addendum)
Pt currently awake and resting in bed. Pt is calm and cooperative. Pt reported anxiety this morning. Pt took her scheduled medications and prn hydroxyzine PO. Pt is pleasant and cooperative. Pt denies SI and contracts for safety. MHT sitter is within line of sight of pt. Pt 1:1 continues. Pt remains safe on the unit.
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
## 2023-01-01 NOTE — Progress Notes (Signed)
Nursing 1:1 note D:Pt observed lying in bed with eyes closed. RR even and unlabored. No distress noted. A: 1:1 observation continues for safety  R: pt remains safe

## 2023-01-01 NOTE — Group Note (Signed)
[facility] LCSW Group Therapy Note   Group Date: 01/01/2023 Start Time: 1100 End Time: 1200  Type of Therapy:  Group Therapy  Participation Level:  Patient did not attend group on today as she was speaking to the MD at the time of group.    Loleta Dicker, LCSW

## 2023-01-01 NOTE — Progress Notes (Signed)
Nursing 1:1 Note D:Patient observed sleeping soundly in bed with eyes closed. RR even and unlabored. No distress noted.   A: 1:1 observation continues for safety  R: Patient remains safe

## 2023-01-01 NOTE — Progress Notes (Signed)
   12/31/22 2200  Psych Admission Type (Psych Patients Only)  Admission Status Voluntary  Psychosocial Assessment  Patient Complaints Anxiety  Eye Contact Fair  Facial Expression Anxious;Flat  Affect Anxious;Depressed;Preoccupied  Speech Logical/coherent  Interaction Assertive  Motor Activity Restless  Appearance/Hygiene Unremarkable  Behavior Characteristics Appropriate to situation  Mood Anxious;Preoccupied  Thought Process  Coherency Disorganized  Content Blaming others  Delusions None reported or observed  Perception WDL  Hallucination None reported or observed  Judgment Poor  Confusion None  Danger to Self  Current suicidal ideation? Denies  Self-Injurious Behavior No self-injurious ideation or behavior indicators observed or expressed   Agreement Not to Harm Self Yes  Description of Agreement Verbal  Danger to Others  Danger to Others None reported or observed  Danger to Others Abnormal  Harmful Behavior to others No threats or harm toward other people  Destructive Behavior No threats or harm toward property

## 2023-01-01 NOTE — Plan of Care (Signed)
Problem: Education: Goal: Emotional status will improve Outcome: Progressing Goal: Mental status will improve Outcome: Progressing Goal: Verbalization of understanding the information provided will improve Outcome: Progressing   Problem: Activity: Goal: Interest or engagement in activities will improve Outcome: Progressing Goal: Sleeping patterns will improve Outcome: Progressing   Problem: Coping: Goal: Ability to verbalize frustrations and anger appropriately will improve Outcome: Progressing   Problem: Safety: Goal: Periods of time without injury will increase Outcome: Progressing
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
## 2023-01-02 DIAGNOSIS — F250 Schizoaffective disorder, bipolar type: Secondary | ICD-10-CM | POA: Diagnosis not present

## 2023-01-02 NOTE — Progress Notes (Signed)
  Nursing 1:1 Note D:Patient observed sleeping soundly in bed with eyes closed. RR, even, and unlabored. No distress noted.   A: 1:1 observation continues for safety  R: Patient remains safe

## 2023-01-02 NOTE — Progress Notes (Signed)
Nursing 1:1 note D:Patient released from restraint and placed quiet room, observed sitting on bed calmly. RR even and unlabored. No distress noted.  A: 1:1 observation continues for safety  R: Patient remains safe

## 2023-01-02 NOTE — Plan of Care (Signed)
  Problem: Education: Goal: Mental status will improve Outcome: Progressing   Problem: Activity: Goal: Interest or engagement in activities will improve Outcome: Progressing Goal: Sleeping patterns will improve Outcome: Progressing   Problem: Coping: Goal: Ability to demonstrate self-control will improve Outcome: Not Progressing

## 2023-01-02 NOTE — Progress Notes (Signed)
Referral sent to [facility]) [phone] for admission. [facility] reported referral information received and asked [facility] to call daily to determine status of admission.

## 2023-01-02 NOTE — Progress Notes (Signed)
Nursing 1:1 Note   D: Pt is laying in bedroom asleep. Respirations noted even and unlabored. Pt compliant with medications. Visible in the dayroom  A: Water and snack offered. Emotional support and availability provided. Minimizing stimuli. Offered rest. Maintaining 1: 1 for safety.  R: Pt remains sleeping in bedroom quietly. Receptive to care interventions at this time. Remains safe on 1:1.

## 2023-01-02 NOTE — Plan of Care (Signed)
  Problem: Education: Goal: Verbalization of understanding the information provided will improve Outcome: Progressing   Problem: Activity: Goal: Sleeping patterns will improve Outcome: Progressing   Problem: Education: Goal: Emotional status will improve Outcome: Not Progressing   Problem: Coping: Goal: Ability to verbalize frustrations and anger appropriately will improve Outcome: Not Progressing Goal: Ability to demonstrate self-control will improve Outcome: Not Progressing   Problem: Self-Concept: Goal: Will verbalize positive feelings about self Outcome: Not Progressing

## 2023-01-02 NOTE — Progress Notes (Signed)
Nursing 1:1 Note:  Patient in her room sleeping. No distress noted. Patient remains safe at this time.

## 2023-01-02 NOTE — Progress Notes (Addendum)
[facility] 1:1 Note:  1000: Patient is observed laying in bed in the quiet room at this time. No signs of distress observed or reported at this time. Patient's 1:1 sitter is observed sitting at patient's bedside. 1:1 precautions remain in place for safety.
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
## 2023-01-02 NOTE — Group Note (Signed)
Recreation Therapy Group Note   Group Topic:Team Building  Group Date: 01/02/2023 Start Time: 0935 End Time: 1015 Facilitators: Jarin Cornfield-McCall, LRT,CTRS Location: [address] Dayroom   Group Topic: Communication, Team Building, Problem Solving  Goal Area(s) Addresses:  Patient will effectively work with peer towards shared goal.  Patient will identify skills used to make activity successful.  Patient will identify how skills used during activity can be used to reach post d/c goals.   Intervention: STEM Activity  Group Description: Straw Bridge. In teams of 3-5, patients were given 15 plastic drinking straws and an equal length of masking tape. Using the materials provided, patients were instructed to build a free standing bridge-like structure to suspend an everyday item (ex: puzzle box) off of the floor or table surface. All materials were required to be used by the team in their design. LRT facilitated post-activity discussion reviewing team process. Patients were encouraged to reflect how the skills used in this activity can be generalized to daily life post discharge.   Education: Pharmacist, community, Scientist, physiological, Discharge Planning   Education Outcome: Acknowledges education/In group clarification offered/Needs additional education.    Affect/Mood: N/A   Participation Level: Did not attend    Clinical Observations/Individualized Feedback:     Plan: Continue to engage patient in RT group sessions 2-3x/week.   Emmarose Klinke-McCall, LRT,CTRSj 01/02/2023 12:05 PM

## 2023-01-02 NOTE — Progress Notes (Signed)
   01/02/23 0928  Psych Admission Type (Psych Patients Only)  Admission Status Voluntary  Psychosocial Assessment  Patient Complaints Agitation;Depression;Irritability;Worrying  Eye Contact Brief  Facial Expression Anxious;Wide-eyed  Affect Labile  Speech Pressured  Interaction Defensive;Guarded  Motor Activity Fidgety;Restless  Appearance/Hygiene Unremarkable  Behavior Characteristics Agitated;Irritable  Mood Depressed;Anxious  Thought Process  Coherency Disorganized  Content Blaming others  Delusions None reported or observed  Perception WDL  Hallucination None reported or observed  Judgment Poor  Confusion None  Danger to Self  Current suicidal ideation? Denies  Description of Suicide Plan No plan  Self-Injurious Behavior No self-injurious ideation or behavior indicators observed or expressed   Agreement Not to Harm Self Yes  Description of Agreement Verbal  Danger to Others  Danger to Others None reported or observed  Danger to Others Abnormal  Harmful Behavior to others No threats or harm toward other people
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
## 2023-01-02 NOTE — Progress Notes (Signed)
2140 -Patient in restraint chair agitated, crying, and resisting restraints. Agitation Protocol followed. Patient agreed to take medication orally, Benadryl 50mg  and Ativan 2mg  po given. 2240 - Medication effective, patient resting soundly in bed with eyes closed. Respirations regular, even, and unlabored. No apparent signs of distress noted.

## 2023-01-02 NOTE — Progress Notes (Signed)
   01/01/23 2015  Psych Admission Type (Psych Patients Only)  Admission Status Voluntary  Psychosocial Assessment  Patient Complaints Agitation;Worthlessness;Depression;Irritability  Eye Contact Fair  Facial Expression Flat  Affect Irritable;Labile;Angry  Speech Pressured  Interaction Guarded;Defensive  Motor Activity Fidgety;Restless  Appearance/Hygiene Unremarkable  Behavior Characteristics Agitated  Mood Irritable  Thought Process  Coherency Disorganized  Content Blaming others  Delusions None reported or observed  Perception WDL  Hallucination None reported or observed  Judgment Poor  Confusion None  Danger to Self  Current suicidal ideation? Denies  Self-Injurious Behavior No self-injurious ideation or behavior indicators observed or expressed   Agreement Not to Harm Self Yes  Description of Agreement Verbal  Danger to Others  Danger to Others None reported or observed  Danger to Others Abnormal  Harmful Behavior to others Threats of violence towards other people observed or expressed  (Patient reported having homicidal thoughts toward family and "those telling lies about me.")  Destructive Behavior Threats of violence towards property observed or expressed  (Off going RN reported patient had pulled call box casing off the wall in patient room toward the end of dayshift.)  Description of Destructive Behavior Off going RN reported patient had pulled call box casing off the wall in patient room toward the end of dayshift. (Off going RN reported patient had pulled call box casing off the wall in patient room toward the end of dayshift.)

## 2023-01-02 NOTE — Progress Notes (Addendum)
Notified by sitter that pt had removed the call bell system from the wall. Pt was visible in bedroom laying in bed. Pt reported that she was upset and decided to remove the call bell system from the wall out of anger. Call bell system was completely detached from the wall and exposing wiring. To maintain the pt's safety, pt was transferred to the quiet room.

## 2023-01-02 NOTE — Progress Notes (Signed)
Nursing 1:1 Note: Patient has been in her room most of the afternoon. Patient calm and redirectable. 1:1 monitoring continues. Patient remains safe at this time.

## 2023-01-03 DIAGNOSIS — F250 Schizoaffective disorder, bipolar type: Secondary | ICD-10-CM | POA: Diagnosis not present

## 2023-01-03 MED ORDER — OLANZAPINE 7.5 MG PO TABS
15.0000 mg | ORAL_TABLET | Freq: Two times a day (BID) | ORAL | Status: DC
  Administered 2023-01-03 – 2023-01-15 (×24): 15 mg via ORAL
  Filled 2023-01-03: qty 2
  Filled 2023-01-03 (×6): qty 4
  Filled 2023-01-03: qty 2
  Filled 2023-01-03: qty 6
  Filled 2023-01-03: qty 4
  Filled 2023-01-03: qty 2
  Filled 2023-01-03 (×2): qty 4
  Filled 2023-01-03 (×2): qty 2
  Filled 2023-01-03 (×2): qty 6
  Filled 2023-01-03 (×4): qty 4
  Filled 2023-01-03: qty 2
  Filled 2023-01-03: qty 6
  Filled 2023-01-03 (×3): qty 4
  Filled 2023-01-03: qty 2
  Filled 2023-01-03 (×6): qty 4
  Filled 2023-01-03: qty 6

## 2023-01-03 MED ORDER — PROPAFENONE HCL ER 225 MG PO CP12
225.0000 mg | ORAL_CAPSULE | Freq: Two times a day (BID) | ORAL | Status: DC
  Administered 2023-01-03 – 2023-01-15 (×24): 225 mg via ORAL
  Filled 2023-01-03 (×7): qty 4
  Filled 2023-01-03: qty 1
  Filled 2023-01-03 (×2): qty 4
  Filled 2023-01-03: qty 1
  Filled 2023-01-03 (×6): qty 4
  Filled 2023-01-03 (×3): qty 1
  Filled 2023-01-03: qty 4
  Filled 2023-01-03: qty 1
  Filled 2023-01-03 (×6): qty 4

## 2023-01-03 NOTE — Progress Notes (Signed)
   01/03/23 2000  Psych Admission Type (Psych Patients Only)  Admission Status Voluntary  Psychosocial Assessment  Patient Complaints Anxiety  Eye Contact Fair  Facial Expression Anxious  Affect Anxious  Speech Logical/coherent  Interaction Assertive  Motor Activity Fidgety  Appearance/Hygiene Unremarkable  Behavior Characteristics Cooperative  Mood Anxious  Thought Process  Coherency Circumstantial  Content Blaming others  Delusions WDL;None reported or observed  Perception WDL  Hallucination None reported or observed  Judgment Poor  Confusion None  Danger to Self  Current suicidal ideation? Denies  Danger to Others  Danger to Others None reported or observed  Danger to Others Abnormal  Harmful Behavior to others No threats or harm toward other people

## 2023-01-03 NOTE — Progress Notes (Signed)
Pt requested to go back to her room. Pt was able to verbal contract for safety and apologized for her behavior earlier. Pt remains safe. Q 15 checks continue.

## 2023-01-03 NOTE — Progress Notes (Signed)
   01/02/23 2046  Psych Admission Type (Psych Patients Only)  Admission Status Voluntary  Psychosocial Assessment  Patient Complaints Depression;Worrying  Eye Contact Fair  Facial Expression Flat  Affect Anxious  Speech Logical/coherent  Interaction Assertive  Motor Activity Fidgety  Appearance/Hygiene Unremarkable  Behavior Characteristics Cooperative  Mood Depressed;Anxious  Thought Process  Coherency Disorganized  Content Blaming others  Delusions None reported or observed  Perception WDL  Hallucination None reported or observed  Judgment Poor  Confusion None  Danger to Self  Current suicidal ideation? Denies  Self-Injurious Behavior No self-injurious ideation or behavior indicators observed or expressed   Agreement Not to Harm Self Yes  Description of Agreement verbal  Danger to Others  Danger to Others None reported or observed  Danger to Others Abnormal  Harmful Behavior to others No threats or harm toward other people

## 2023-01-03 NOTE — Progress Notes (Signed)
   01/03/23 0557  15 Minute Checks  Location Bedroom  Visual Appearance Calm  Behavior Sleeping  Sleep (Behavioral Health Patients Only)  Calculate sleep? (Click Yes once per 24 hr at 0600 safety check) Yes  Documented sleep last 24 hours 9.75

## 2023-01-03 NOTE — Progress Notes (Signed)
Nursing 1:1 note D:Pt observed sleeping in bed with eyes closed. RR even and unlabored. No distress noted. A: 1:1 observation continues for safety  R: Pt remains safe

## 2023-01-03 NOTE — Progress Notes (Signed)
Pt observed banging her head in dayroom on glass window. Pt prompted multiple times to stop. Pt was able to control her impulse and stop but then repeated the behavior again. 1:1 given in which Pt ultimately stated "I don't want to live, there is nothing to live for". PRN ativan and benadryl given for agitation. Pt was directed to quiet room so that she can use her coping skills and relaxation techniques to calm down. Will continue to monitor her behavior.

## 2023-01-03 NOTE — Progress Notes (Signed)
[facility] Post 1:1 Observation Documentation  For the first (8) hours following discontinuation of 1:1 precautions, a progress note entry by nursing staff should be documented at least every 2 hours, reflecting the patient's behavior, condition, mood, and conversation.  Use the progress notes for additional entries.  Time 1:1 discontinued:  10 AM  Patient's Behavior:  Patient is calm and cooperative  Patient's Condition:  Patient sitting in the dayroom  Patient's Conversation:  Interacting with staff.  Audrie Lia Brookelin Felber 01/03/2023, 6:39 PM

## 2023-01-03 NOTE — Progress Notes (Signed)
Patient told nurse that now her thoughts have gotten better and does not want to harm herself anymore. Patient agreed to let the staff know if she has thoughts to harm herself and contracts for safety in her room. Will continue to monitor

## 2023-01-03 NOTE — Plan of Care (Signed)
Problem: Activity: Goal: Interest or engagement in activities will improve Outcome: Progressing   Problem: Health Behavior/Discharge Planning: Goal: Compliance with treatment plan for underlying cause of condition will improve Outcome: Progressing

## 2023-01-03 NOTE — Progress Notes (Signed)
[facility] Post 1:1 Observation Documentation  For the first (8) hours following discontinuation of 1:1 precautions, a progress note entry by nursing staff should be documented at least every 2 hours, reflecting the patient's behavior, condition, mood, and conversation.  Use the progress notes for additional entries.  Time 1:1 discontinued:  10 AM  Patient's Behavior:  Patient is calm and cooperative  Patient's Condition:  Patient is calm and appropriate  Patient's Conversation:  Interacting with peers.  [name] 01/03/2023, 6:42 PM

## 2023-01-03 NOTE — Progress Notes (Signed)
[facility] Post 1:1 Observation Documentation  For the first (8) hours following discontinuation of 1:1 precautions, a progress note entry by nursing staff should be documented at least every 2 hours, reflecting the patient's behavior, condition, mood, and conversation.  Use the progress notes for additional entries.  Time 1:1 discontinued:  10 AM  Patient's Behavior:  Patient is calm and cooperative  Patient's Condition:  Patient in the Cafeteria for lunch.  Patient's Conversation:  Patient interacting well with staff and peers.  Audrie Lia Stuart Mirabile 01/03/2023, 1:14 PM

## 2023-01-03 NOTE — Progress Notes (Signed)
1:1 Nursing note: Pt is laying in bed . Respirations are even and unlabored. No signs of distress. 1:1 continued for pt safety. Safety maintained.

## 2023-01-03 NOTE — Progress Notes (Signed)
[facility] MD Progress Note  01/03/2023 3:30 PM [name]  MRN:  [id number] Subjective:   [name] is a 32 yr old female who presented on 11/8 to [facility] with complaints of SI with a plan to OD, she was admitted to [facility] on 11/14.  PPHx is significant for Schizoaffective Disorder, Bipolar Type, Borderline Personality Disorder, GAD, PTSD, and ASD, and multiple Suicide Attempts, history of Self Injurious Behavior (cutting), and multiple Psychiatric Hospitalizations (last [facility] 1024).   Case was discussed in the multidisciplinary team. MAR was reviewed and patient was compliant with medications.  She received PRN Hydroxyzine yesterday.   Psychiatric Team made the following recommendations yesterday: -Continue Zyprexa 15 mg BID for mood stability and psychosis -Continue Cymbalta 90 mg daily for depression and chronic pain -Continue Gabapentin 600 mg TID for anxiety and chronic pain -Continue Prazosin 1 mg QHS for nightmares -Continue 1:1 monitoring   On interview today patient reports she slept good last night.  She reports her appetite is doing good.  She reports no SI, HI, or AVH.  She reports no Paranoia or Ideas of Reference.  She reports no issues with her medications.  She reports that she is feeling better today and is hopeful she might be able to discharge this weekend.  Discussed with her that for this to happen she would have to show improvement without further incidents.  Discussed with her that we would trial her off the one-to-one monitoring today.  Discussed with her that if she was able to do well with this she would be moved to the [address] [employer].  Discussed with her if at that point she continued to do well without any incidents we could consider discharge.  She reports understanding and was agreeable with this.  She then requests a change in the timing of her medication.  She reports that at home she takes her  Propafenone at 8 PM and also requests her  evening dose of Zyprexa be moved to that time as it is making her too tired during the early evening taking it at 5 PM.  Discussed with her that these times could be changed.  She reports no other concerns at present.  Principal Problem: Schizoaffective disorder, bipolar type (HCC) Diagnosis: Principal Problem:   Schizoaffective disorder, bipolar type (HCC) Active Problems:   Borderline personality disorder (HCC)   Asperger syndrome   PTSD (post-traumatic stress disorder)   Asthma   Self-injurious behavior   Fibromyalgia   Prolonged QT interval  Total Time spent with patient:  I personally spent 35 minutes on the unit in direct patient care. The direct patient care time included face-to-face time with the patient, reviewing the patient's chart, communicating with other professionals, and coordinating care. Greater than 50% of this time was spent in counseling or coordinating care with the patient regarding goals of hospitalization, psycho-education, and discharge planning needs.   Past Psychiatric History: Schizoaffective Disorder, Bipolar Type, Borderline Personality Disorder, GAD, PTSD, and ASD, and multiple Suicide Attempts, history of Self Injurious Behavior (cutting), and multiple Psychiatric Hospitalizations (last [facility] 1024).  Past Medical History:  Past Medical History:  Diagnosis Date   Acid reflux    Adjustment disorder with mixed anxiety and depressed mood 01/31/2022   Adjustment disorder with mixed disturbance of emotions and conduct 08/03/2019   Anxiety    Asthma    last attack 03/13/15 or 03/14/15   Autism    Bipolar 1 disorder, depressed, severe (HCC) 07/25/2021   Carrier  of fragile X syndrome    Chronic constipation    Depression    Drug-seeking behavior    Essential tremor    Headache    Ineffective individual coping 05/16/2022   Insomnia 01/12/2022   Intentional drug overdose (HCC) 06/05/2022   Neuromuscular disorder (HCC)    Normocytic anemia 06/05/2022    Overdose 07/22/2017   Overdose of acetaminophen 07/2017   and other meds   Overdose, intentional self-harm, initial encounter (HCC) 07/20/2021   Paranoia (HCC) 04/22/2021   Personality disorder (HCC)    Prolonged QT interval    Purposeful non-suicidal drug ingestion (HCC) 06/27/2021   Schizo-affective psychosis (HCC)    Schizoaffective disorder (HCC) 07/29/2022   Schizoaffective disorder, bipolar type (HCC)    Seizures (HCC)    Last seizure December 2017   Skin erythema 04/27/2022   Sleep apnea    Suicidal behavior 07/25/2021   Suicidal ideation    Suicide (HCC) 07/01/2021   Suicide attempt (HCC) 07/04/2021    Past Surgical History:  Procedure Laterality Date   MOUTH SURGERY  2009 or 2010   Family History:  Family History  Problem Relation Age of Onset   Mental illness Father    Asthma Father    PDD Brother    Seizures Brother    Family Psychiatric  History: Mental illness in her father; PDD in her brother; Seizures in her brother.  Social History:  Social History   Substance and Sexual Activity  Alcohol Use No   Alcohol/week: 1.0 standard drink of alcohol   Types: 1 Standard drinks or equivalent per week   Comment: denies at this time     Social History   Substance and Sexual Activity  Drug Use No   Comment: History of cocaine use at age 61 for 4 months    Social History   Socioeconomic History   Marital status: Widowed    Spouse name: Not on file   Number of children: 0   Years of education: Not on file   Highest education level: Not on file  Occupational History   Occupation: disability  Tobacco Use   Smoking status: Former    Types: Cigarettes   Smokeless tobacco: Never   Tobacco comments:    Smoked for 2  years age [age]  Vaping Use   Vaping status: Never Used  Substance and Sexual Activity   Alcohol use: No    Alcohol/week: 1.0 standard drink of alcohol    Types: 1 Standard drinks or equivalent per week    Comment: denies at this time    Drug use: No    Comment: History of cocaine use at age 56 for 4 months   Sexual activity: Not Currently    Birth control/protection: None  Other Topics Concern   Not on file  Social History Narrative   Marital status: Widowed      Children: daughter      Lives: with boyfriend, in two story home      Employment:  Disability      Tobacco: quit smoking; smoked for two years.      Alcohol ;none      Drugs: none   Has not traveled outside of the country.   Right handed         Patient with hx of Fibromyalgia,Orthostatic Tachycardia,Asthma,Arthritis,Gerd, ASD   Social Determinants of Health   Financial Resource Strain: Not on file  Food Insecurity: No Food Insecurity (12/25/2022)   Hunger Vital Sign    Worried  About Running Out of Food in the Last Year: Never true    Ran Out of Food in the Last Year: Never true  Transportation Needs: No Transportation Needs (12/25/2022)   PRAPARE - Administrator, Civil Service (Medical): No    Lack of Transportation (Non-Medical): No  Physical Activity: Not on file  Stress: Not on file  Social Connections: Not on file   Additional Social History:                         Sleep: Good  Appetite:  Good  Current Medications: Current Facility-Administered Medications  Medication Dose Route Frequency Provider Last Rate Last Admin   albuterol (PROVENTIL) (2.5 MG/3ML) 0.083% nebulizer solution 2.5 mg  2.5 mg Nebulization Q6H PRN Weber, Kyra A, NP       alum & mag hydroxide-simeth (MAALOX/MYLANTA) 200-200-20 MG/5ML suspension 30 mL  30 mL Oral Q4H PRN Weber, Bella Kennedy A, NP       bacitracin ointment   Topical BID Starleen Blue, NP   Given at 01/03/23 0843   diphenhydrAMINE (BENADRYL) capsule 50 mg  50 mg Oral TID PRN Phebe Colla A, NP   50 mg at 01/01/23 2139   Or   diphenhydrAMINE (BENADRYL) injection 50 mg  50 mg Intramuscular TID PRN Weber, Bella Kennedy A, NP   50 mg at 12/27/22 0912   DULoxetine (CYMBALTA) DR capsule 90 mg  90 mg Oral  Daily Rex Kras, MD   90 mg at 01/03/23 0839   gabapentin (NEURONTIN) capsule 600 mg  600 mg Oral TID Rex Kras, MD   600 mg at 01/03/23 1311   hydrOXYzine (ATARAX) tablet 25 mg  25 mg Oral TID PRN Starleen Blue, NP   25 mg at 01/02/23 1117   ibuprofen (ADVIL) tablet 600 mg  600 mg Oral Q8H PRN Weber, Kyra A, NP   600 mg at 01/03/23 1312   LORazepam (ATIVAN) tablet 2 mg  2 mg Oral TID PRN Phebe Colla A, NP   2 mg at 01/01/23 2139   Or   LORazepam (ATIVAN) injection 2 mg  2 mg Intramuscular TID PRN Weber, Bella Kennedy A, NP   2 mg at 12/27/22 0912   magnesium hydroxide (MILK OF MAGNESIA) suspension 30 mL  30 mL Oral Daily PRN Weber, Kyra A, NP   30 mL at 12/23/22 1352   melatonin tablet 3 mg  3 mg Oral QHS Nkwenti, Doris, NP   3 mg at 01/02/23 2046   mometasone-formoterol (DULERA) 200-5 MCG/ACT inhaler 2 puff  2 puff Inhalation BID Ajibola, Ene A, NP   2 puff at 01/03/23 0839   OLANZapine (ZYPREXA) tablet 15 mg  15 mg Oral BID Lauro Franklin, MD       ondansetron (ZOFRAN-ODT) disintegrating tablet 4 mg  4 mg Oral Q8H PRN Weber, Kyra A, NP   4 mg at 12/31/22 1357   pantoprazole (PROTONIX) EC tablet 40 mg  40 mg Oral QAC breakfast Weber, Kyra A, NP   40 mg at 01/03/23 0612   prazosin (MINIPRESS) capsule 1 mg  1 mg Oral QHS Weber, Kyra A, NP   1 mg at 01/02/23 2046   propafenone (RYTHMOL SR) 12 hr capsule 225 mg  225 mg Oral BID Lauro Franklin, MD        Lab Results: No results found for this or any previous visit (from the past 48 hour(s)).  Blood Alcohol level:  Lab Results  Component  Value Date   ETH <10 12/18/2022   ETH <10 12/14/2022    Metabolic Disorder Labs: Lab Results  Component Value Date   HGBA1C 5.4 10/22/2022   MPG 108 10/22/2022   MPG 103 04/20/2022   Lab Results  Component Value Date   PROLACTIN 20.0 12/14/2022   PROLACTIN 66.2 (H) 03/02/2021   Lab Results  Component Value Date   CHOL 188 12/14/2022   TRIG 222 (H) 12/14/2022   HDL 41  12/14/2022   CHOLHDL 4.6 12/14/2022   VLDL 44 (H) 12/14/2022   LDLCALC 103 (H) 12/14/2022   LDLCALC 117 (H) 10/22/2022    Physical Findings: AIMS:  , ,  ,  ,    CIWA:    COWS:     Musculoskeletal: Strength & Muscle Tone: within normal limits Gait & Station: normal Patient leans: N/A  Psychiatric Specialty Exam:  Presentation  General Appearance:  Appropriate for Environment; Casual  Eye Contact: Fair  Speech: Clear and Coherent  Speech Volume: Normal  Handedness: Right   Mood and Affect  Mood: Dysphoric  Affect: Congruent   Thought Process  Thought Processes: Coherent  Descriptions of Associations:Intact  Orientation:Full (Time, Place and Person)  Thought Content:WDL  History of Schizophrenia/Schizoaffective disorder:Yes  Duration of Psychotic Symptoms:Greater than six months  Hallucinations:Hallucinations: None Description of Command Hallucinations: Hearing soft voices Description of Auditory Hallucinations: Hearing soft voices Description of Visual Hallucinations: Denies  Ideas of Reference:None  Suicidal Thoughts:Suicidal Thoughts: No SI Active Intent and/or Plan: -- (Denies) SI Passive Intent and/or Plan: -- (Denies)  Homicidal Thoughts:Homicidal Thoughts: No HI Active Intent and/or Plan: Without Intent; Without Plan; Without Means to Carry Out HI Passive Intent and/or Plan: Without Intent; Without Plan; Without Means to Carry Out   Sensorium  Memory: Immediate Fair  Judgment: Intact  Insight: Present   Executive Functions  Concentration: Fair  Attention Span: Fair  Recall: [employer] of Knowledge: Fair  Language: Fair   Psychomotor Activity  Psychomotor Activity: Psychomotor Activity: Normal   Assets  Assets: Resilience   Sleep  Sleep: Sleep: Good Number of Hours of Sleep: 9.75    Physical Exam: Physical Exam Vitals and nursing note reviewed.  Constitutional:      General: She is not in  acute distress.    Appearance: Normal appearance. She is normal weight. She is not ill-appearing or toxic-appearing.  HENT:     Head: Normocephalic and atraumatic.  Pulmonary:     Effort: Pulmonary effort is normal.  Musculoskeletal:        General: Normal range of motion.  Neurological:     General: No focal deficit present.     Mental Status: She is alert.    Review of Systems  Respiratory:  Negative for cough and shortness of breath.   Cardiovascular:  Negative for chest pain.  Gastrointestinal:  Negative for abdominal pain, constipation, diarrhea, nausea and vomiting.  Neurological:  Negative for dizziness, weakness and headaches.  Psychiatric/Behavioral:  Negative for depression, hallucinations and suicidal ideas. The patient is not nervous/anxious.    Blood pressure 132/78, pulse (!) 114, temperature 98.2 F (36.8 C), temperature source Oral, resp. rate 18, height 5\' 4"  (1.626 m), weight 112.5 kg, SpO2 100%. Body mass index is 42.57 kg/m.   Treatment Plan Summary: Daily contact with patient to assess and evaluate symptoms and progress in treatment and Medication management  [name] Phann is a 32 yr old female who presented on 11/8 to [facility] with complaints of SI with a  plan to OD, she was admitted to [facility] on 11/14.  PPHx is significant for Schizoaffective Disorder, Bipolar Type, Borderline Personality Disorder, GAD, PTSD, and ASD, and multiple Suicide Attempts, history of Self Injurious Behavior (cutting), and multiple Psychiatric Hospitalizations (last [facility] 1024).   Camrin has done well the last 2 days while on the one-to-one without further incidents.  We will trial her today off of the one-to-one.  If she is able to handle this we will transition her to the [address] tomorrow.  If she continues to do well can consider discharge this weekend.  We will change the timing of her evening Zyprexa and Propafenone to 8 PM as this is when she takes it at home.  We will not  make any other changes to her medications at this time.  We will continue to monitor.   Schizoaffective Disorder, Bipolar Type: -Continue Zyprexa 15 mg BID for mood stability and psychosis -Continue Cymbalta 90 mg daily for depression and chronic pain -Continue Gabapentin 600 mg TID for anxiety and chronic pain -Continue Prazosin 1 mg QHS for nightmares -Stop 1:1 monitoring -Continue Agitation Protocol: Ativan/Benadryl   Asthma: -Continue Dulera 2 puff BID -Continue Albuterol Nebuliation 2.5 mg q6 PRN wheezing   -Continue Propafenone 225 mg BID -Continue Protonix 40 mg daily -Continue Melatonin 3 mg QHS for insomnia -Continue PRN's: Ibuprofen, Maalox, Atarax, Milk of Magnesia, Trazodone  Repeat EKG on 11/20 showed Sinus Tach (108) with Qtc: 487   Lauro Franklin, MD 01/03/2023, 3:30 PM

## 2023-01-03 NOTE — Progress Notes (Signed)
Dar Note: Patient presents with calm affect and mood.  Constant supervision discontinued.  Denies suicidal thoughts, auditory and visual hallucinations.  Medications given as prescribed.  Routine safety checks maintained.  Patient is safe on the unit.

## 2023-01-03 NOTE — Progress Notes (Signed)
[facility] Post 1:1 Observation Documentation  For the first (8) hours following discontinuation of 1:1 precautions, a progress note entry by nursing staff should be documented at least every 2 hours, reflecting the patient's behavior, condition, mood, and conversation.  Use the progress notes for additional entries.  Time 1:1 discontinued:  10 AM  Patient's Behavior:  Patient in the dayroom,  watching TV  Patient's Condition:  Patient is calm and appropriate  Patient's Conversation:  Interacting well with staff and peers.  Audrie Lia Kyriaki Moder 01/03/2023, 3:37 PM

## 2023-01-04 ENCOUNTER — Encounter (HOSPITAL_COMMUNITY): Payer: Self-pay

## 2023-01-04 DIAGNOSIS — F250 Schizoaffective disorder, bipolar type: Secondary | ICD-10-CM | POA: Diagnosis not present

## 2023-01-04 NOTE — BH IP Treatment Plan (Signed)
Interdisciplinary Treatment and Diagnostic Plan Update  01/04/2023 Time of Session: 11:30 am - UPDATE Kenteria Lippe MRN: [id number]  Principal Diagnosis: Schizoaffective disorder, bipolar type (HCC)  Secondary Diagnoses: Principal Problem:   Schizoaffective disorder, bipolar type (HCC) Active Problems:   Borderline personality disorder (HCC)   Asperger syndrome   PTSD (post-traumatic stress disorder)   Asthma   Self-injurious behavior   Fibromyalgia   Prolonged QT interval   Current Medications:  Current Facility-Administered Medications  Medication Dose Route Frequency Provider Last Rate Last Admin   albuterol (PROVENTIL) (2.5 MG/3ML) 0.083% nebulizer solution 2.5 mg  2.5 mg Nebulization Q6H PRN Weber, Kyra A, NP       alum & mag hydroxide-simeth (MAALOX/MYLANTA) 200-200-20 MG/5ML suspension 30 mL  30 mL Oral Q4H PRN Weber, Bella Kennedy A, NP       bacitracin ointment   Topical BID Starleen Blue, NP   Given at 01/04/23 0806   diphenhydrAMINE (BENADRYL) capsule 50 mg  50 mg Oral TID PRN Phebe Colla A, NP   50 mg at 01/03/23 2122   Or   diphenhydrAMINE (BENADRYL) injection 50 mg  50 mg Intramuscular TID PRN Weber, Bella Kennedy A, NP   50 mg at 12/27/22 0912   DULoxetine (CYMBALTA) DR capsule 90 mg  90 mg Oral Daily Rex Kras, MD   90 mg at 01/04/23 9562   gabapentin (NEURONTIN) capsule 600 mg  600 mg Oral TID Rex Kras, MD   600 mg at 01/04/23 1308   hydrOXYzine (ATARAX) tablet 25 mg  25 mg Oral TID PRN Starleen Blue, NP   25 mg at 01/03/23 1901   ibuprofen (ADVIL) tablet 600 mg  600 mg Oral Q8H PRN Weber, Kyra A, NP   600 mg at 01/03/23 1312   LORazepam (ATIVAN) tablet 2 mg  2 mg Oral TID PRN Phebe Colla A, NP   2 mg at 01/03/23 2122   Or   LORazepam (ATIVAN) injection 2 mg  2 mg Intramuscular TID PRN Weber, Bella Kennedy A, NP   2 mg at 12/27/22 0912   magnesium hydroxide (MILK OF MAGNESIA) suspension 30 mL  30 mL Oral Daily PRN Weber, Kyra A, NP   30 mL at 12/23/22 1352    melatonin tablet 3 mg  3 mg Oral QHS Nkwenti, Dashanti Burr, NP   3 mg at 01/03/23 2029   mometasone-formoterol (DULERA) 200-5 MCG/ACT inhaler 2 puff  2 puff Inhalation BID Ajibola, Ene A, NP   2 puff at 01/04/23 0807   OLANZapine (ZYPREXA) tablet 15 mg  15 mg Oral BID Lauro Franklin, MD   15 mg at 01/04/23 0806   ondansetron (ZOFRAN-ODT) disintegrating tablet 4 mg  4 mg Oral Q8H PRN Weber, Kyra A, NP   4 mg at 12/31/22 1357   pantoprazole (PROTONIX) EC tablet 40 mg  40 mg Oral QAC breakfast Weber, Kyra A, NP   40 mg at 01/04/23 0807   prazosin (MINIPRESS) capsule 1 mg  1 mg Oral QHS Weber, Kyra A, NP   1 mg at 01/03/23 2029   propafenone (RYTHMOL SR) 12 hr capsule 225 mg  225 mg Oral BID Lauro Franklin, MD   225 mg at 01/04/23 0806   PTA Medications: Medications Prior to Admission  Medication Sig Dispense Refill Last Dose   albuterol (VENTOLIN HFA) 108 (90 Base) MCG/ACT inhaler Inhale 2 puffs into the lungs every 6 (six) hours as needed for wheezing or shortness of breath. 1 each 0    ARIPiprazole (ABILIFY)  2 MG tablet Take 1 tablet (2 mg total) by mouth daily for 7 days. 7 tablet 0    Bacitracin-Polymyxin B (NEOSPORIN EX) Apply 1 Application topically as needed (Cuts).      Calcium Carbonate Antacid (TUMS PO) Take 2 tablets by mouth as needed (Heartburn).      cyclobenzaprine (FLEXERIL) 10 MG tablet Take 10 mg by mouth at bedtime.      diclofenac Sodium (VOLTAREN) 1 % GEL Apply 2 g topically 4 (four) times daily. (Patient taking differently: Apply 2 g topically in the morning, at noon, and at bedtime.)      DULoxetine (CYMBALTA) 60 MG capsule Take 1 capsule (60 mg total) by mouth daily for 7 days. 7 capsule 0    gabapentin (NEURONTIN) 600 MG tablet Take 600 mg by mouth 3 (three) times daily.      hydrOXYzine (ATARAX) 25 MG tablet Take 25 mg by mouth 4 (four) times daily as needed for anxiety.      mometasone-formoterol (DULERA) 200-5 MCG/ACT AERO Inhale 2 puffs into the lungs 2 (two)  times daily. 1 each 0    OLANZapine (ZYPREXA) 15 MG tablet Take 15 mg by mouth at bedtime.      OLANZapine (ZYPREXA) 5 MG tablet Take 3 tablets (15 mg total) by mouth at bedtime for 7 days. (Patient taking differently: Take 5 mg by mouth in the morning and at bedtime. Take 1 tablet at 1200 and 1700) 21 tablet 0    ondansetron (ZOFRAN-ODT) 4 MG disintegrating tablet Take 1 tablet (4 mg total) by mouth every 8 (eight) hours as needed for nausea or vomiting. 10 tablet 0    pantoprazole (PROTONIX) 40 MG tablet Take 1 tablet (40 mg total) by mouth daily before breakfast for 7 days. 7 tablet 0    prazosin (MINIPRESS) 1 MG capsule Take 1 capsule (1 mg total) by mouth at bedtime for 7 days. 7 capsule 0    propafenone (RYTHMOL SR) 225 MG 12 hr capsule Take 225 mg by mouth 2 (two) times daily.       Patient Stressors:    Patient Strengths:    Treatment Modalities: Medication Management, Group therapy, Case management,  1 to 1 session with clinician, Psychoeducation, Recreational therapy.   Physician Treatment Plan for Primary Diagnosis: Schizoaffective disorder, bipolar type (HCC) Long Term Goal(s): Improvement in symptoms so as ready for discharge   Short Term Goals: Ability to identify changes in lifestyle to reduce recurrence of condition will improve Ability to verbalize feelings will improve Ability to disclose and discuss suicidal ideas Ability to demonstrate self-control will improve Ability to identify and develop effective coping behaviors will improve Ability to maintain clinical measurements within normal limits will improve Compliance with prescribed medications will improve  Medication Management: Evaluate patient's response, side effects, and tolerance of medication regimen.  Therapeutic Interventions: 1 to 1 sessions, Unit Group sessions and Medication administration.  Evaluation of Outcomes: Progressing  Physician Treatment Plan for Secondary Diagnosis: Principal Problem:    Schizoaffective disorder, bipolar type (HCC) Active Problems:   Borderline personality disorder (HCC)   Asperger syndrome   PTSD (post-traumatic stress disorder)   Asthma   Self-injurious behavior   Fibromyalgia   Prolonged QT interval  Long Term Goal(s): Improvement in symptoms so as ready for discharge   Short Term Goals: Ability to identify changes in lifestyle to reduce recurrence of condition will improve Ability to verbalize feelings will improve Ability to disclose and discuss suicidal ideas Ability to demonstrate self-control  will improve Ability to identify and develop effective coping behaviors will improve Ability to maintain clinical measurements within normal limits will improve Compliance with prescribed medications will improve     Medication Management: Evaluate patient's response, side effects, and tolerance of medication regimen.  Therapeutic Interventions: 1 to 1 sessions, Unit Group sessions and Medication administration.  Evaluation of Outcomes: Progressing   RN Treatment Plan for Primary Diagnosis: Schizoaffective disorder, bipolar type (HCC) Long Term Goal(s): Knowledge of disease and therapeutic regimen to maintain health will improve  Short Term Goals: Ability to remain free from injury will improve, Ability to verbalize frustration and anger appropriately will improve, Ability to participate in decision making will improve, Ability to verbalize feelings will improve, Ability to identify and develop effective coping behaviors will improve, and Compliance with prescribed medications will improve  Medication Management: RN will administer medications as ordered by provider, will assess and evaluate patient's response and provide education to patient for prescribed medication. RN will report any adverse and/or side effects to prescribing provider.  Therapeutic Interventions: 1 on 1 counseling sessions, Psychoeducation, Medication administration, Evaluate  responses to treatment, Monitor vital signs and CBGs as ordered, Perform/monitor CIWA, COWS, AIMS and Fall Risk screenings as ordered, Perform wound care treatments as ordered.  Evaluation of Outcomes: Progressing   LCSW Treatment Plan for Primary Diagnosis: Schizoaffective disorder, bipolar type (HCC) Long Term Goal(s): Safe transition to appropriate next level of care at discharge, Engage patient in therapeutic group addressing interpersonal concerns.  Short Term Goals: Engage patient in aftercare planning with referrals and resources, Increase social support, Increase ability to appropriately verbalize feelings, and Identify triggers associated with mental health/substance abuse issues  Therapeutic Interventions: Assess for all discharge needs, 1 to 1 time with Social worker, Explore available resources and support systems, Assess for adequacy in community support network, Educate family and significant other(s) on suicide prevention, Complete Psychosocial Assessment, Interpersonal group therapy.  Evaluation of Outcomes: Progressing   Progress in Treatment: Attending groups: Yes. Participating in groups: Yes. Taking medication as prescribed: Yes. Toleration medication: Yes. Family/Significant other contact made: Yes, contacted:  [name], QP ([facility]) [phone] / Shanice therapist ([facility] [employer] [facility]) [phone] Patient understands diagnosis: Yes. Discussing patient identified problems/goals with staff: Yes. Medical problems stabilized or resolved: Yes. Denies suicidal/homicidal ideation: Yes. Issues/concerns per patient self-inventory: No.     New problem(s) identified: No, Describe:  None reported    New Short Term/Long Term Goal(s):   medication stabilization, elimination of SI thoughts, development of comprehensive mental wellness plan.     Patient Goals:  " Go home tomorrow, reach out to my ACT team about bringing forms out to my house, and tell them that I do not  want to speak to a female when I call there crisis line "    Discharge Plan or Barriers: Patient recently admitted. CSW will continue to follow and assess for appropriate referrals and possible discharge planning.     Reason for Continuation of Hospitalization: Anxiety Delusions  Depression Medication stabilization Suicidal ideation   Estimated Length of Stay: 1-3 DAYS  Last 3 [place] Suicide Severity Risk Score: Flowsheet Row ED to Hosp-Admission (Current) [facility]18[facility] ED from 12/17/2022 in [facility] Emergency Department at [facility]  C-SSRS RISK CATEGORY High Risk High Risk High Risk       Last [facility] 2/9 Scores:    03/10/2022  10:01 PM 03/10/2022    9:56 PM 02/10/2022    1:49 AM  Depression screen PHQ 2/9  Decreased Interest 1 1 2   Down, Depressed, Hopeless 1 1 2   PHQ - 2 Score 2 2 4   Altered sleeping 1 1 2   Tired, decreased energy 1 1 2   Change in appetite 1 0 2  Feeling bad or failure about yourself  1 1 2   Trouble concentrating 1 1 2   Moving slowly or fidgety/restless 1 1 1   Suicidal thoughts 1 1 1   PHQ-9 Score 9 8 16   Difficult doing work/chores Somewhat difficult  Very difficult    Scribe for Treatment Team: Kathrynn Humble 01/04/2023 11:14 AM

## 2023-01-04 NOTE — Progress Notes (Signed)
   01/04/23 2025  Psych Admission Type (Psych Patients Only)  Admission Status Voluntary  Psychosocial Assessment  Patient Complaints Anxiety  Eye Contact Fair  Facial Expression Flat  Affect Anxious  Speech Logical/coherent  Interaction Assertive  Motor Activity Fidgety  Appearance/Hygiene Unremarkable  Behavior Characteristics Cooperative  Mood Anxious  Thought Process  Coherency Circumstantial  Content Blaming others  Delusions None reported or observed  Perception WDL  Hallucination None reported or observed  Judgment Poor  Confusion None  Danger to Self  Current suicidal ideation? Denies  Danger to Others  Danger to Others None reported or observed  Danger to Others Abnormal  Harmful Behavior to others No threats or harm toward other people

## 2023-01-04 NOTE — [facility] Group Notes (Signed)
Adult Psychoeducational Group Note  Date:  01/04/2023 Time:  9:35 AM  Group Topic/Focus:  Goals Group:   The focus of this group is to help patients establish daily goals to achieve during treatment and discuss how the patient can incorporate goal setting into their daily lives to aide in recovery.  Participation Level:  Active  Participation Quality:  Appropriate  Affect:  Appropriate  Cognitive:  Appropriate  Insight: Appropriate  Engagement in Group:  Engaged  Modes of Intervention:  Exploration  Additional Comments:  Pt stated her goal for today is stay calm, utilize quiet room if voices get too loud. Pt stated wanting to stop self-harming behavior. Pt identified no SI/HI.  Kaliopi Blyden 01/04/2023, 9:35 AM

## 2023-01-04 NOTE — Plan of Care (Signed)
°  Problem: Education: °Goal: Emotional status will improve °Outcome: Progressing °Goal: Mental status will improve °Outcome: Progressing °  °Problem: Activity: °Goal: Interest or engagement in activities will improve °Outcome: Progressing °  °

## 2023-01-04 NOTE — Progress Notes (Signed)
D: Patient is alert, oriented, and cooperative. Denies SI, HI, AVH, and verbally contracts for safety. Patient reports she slept good last night with sleeping medication. Patient reports her appetite as good, energy level as normal, and concentration as good. Patient rates her depression 5/10, hopelessness 10/10, and anxiety 9/10.    A: Scheduled medications administered per MD order. Support provided. Patient educated on safety on the unit and medications. Routine safety checks every 15 minutes. Patient stated understanding to tell nurse about any new physical symptoms. Patient understands to tell staff of any needs.     R: No adverse drug reactions noted. Patient remains safe at this time and will continue to monitor.    01/04/23 1000  Psych Admission Type (Psych Patients Only)  Admission Status Voluntary  Psychosocial Assessment  Patient Complaints Anxiety  Eye Contact Fair  Facial Expression Anxious  Affect Anxious  Speech Logical/coherent  Interaction Assertive  Motor Activity Fidgety  Appearance/Hygiene Unremarkable  Behavior Characteristics Cooperative  Mood Anxious  Thought Process  Coherency Circumstantial  Content Blaming others  Delusions None reported or observed  Perception WDL  Hallucination None reported or observed  Judgment Poor  Confusion None  Danger to Self  Current suicidal ideation? Denies  Self-Injurious Behavior No self-injurious ideation or behavior indicators observed or expressed   Agreement Not to Harm Self Yes  Description of Agreement verbal  Danger to Others  Danger to Others None reported or observed  Danger to Others Abnormal  Harmful Behavior to others No threats or harm toward other people

## 2023-01-04 NOTE — Group Note (Signed)
Recreation Therapy Group Note   Group Topic:Problem Solving  Group Date: 01/04/2023 Start Time: 1000 End Time: 1025 Facilitators: Traevion Poehler-McCall, LRT,CTRS Location: 500 [address] Dayroom   Group Topic: Problem Solving  Goal Area(s) Addresses:  Patient will effectively work in a team with other group members. Patient will verbalize importance of using appropriate problem solving techniques.  Patient will identify positive change associated with effective problem solving skills.   Intervention: Worksheets, Pencils  Group Description: Dentist. Patients were given two sheets of brain teasers. Patients were given 20 minutes to try and figure out as many of teasers they could. Patients were also allowed to work together if they chose to. Once patients finished, LRT would go over the answers with the patients.    Education Outcome: Acknowledges understanding/In group clarification offered/Needs additional education.    Affect/Mood: N/A   Participation Level: Did not attend    Clinical Observations/Individualized Feedback:     Plan: Continue to engage patient in RT group sessions 2-3x/week.   Imane Burrough-McCall, LRT,CTRS 01/04/2023 11:49 AM

## 2023-01-04 NOTE — Plan of Care (Signed)
  Problem: Education: Goal: Mental status will improve Outcome: Progressing Goal: Verbalization of understanding the information provided will improve Outcome: Progressing   Problem: Activity: Goal: Interest or engagement in activities will improve Outcome: Progressing Goal: Sleeping patterns will improve Outcome: Progressing

## 2023-01-04 NOTE — Progress Notes (Signed)
[facility] MD Progress Note  01/04/2023 1:37 PM Clester Cisek  MRN:  [id number] Subjective:   Erynne Steenson is a 32 yr old female who presented on 11/8 to [facility] with complaints of SI with a plan to OD, she was admitted to [facility] on 11/14.  PPHx is significant for Schizoaffective Disorder, Bipolar Type, Borderline Personality Disorder, GAD, PTSD, and ASD, and multiple Suicide Attempts, history of Self Injurious Behavior (cutting), and multiple Psychiatric Hospitalizations (last [facility] 1024).   Case was discussed in the multidisciplinary team. MAR was reviewed and patient was compliant with medications.  She received oral agitation PRN's of Benadryl and Ativan after banging her head against the glass in the day room last evening.  She received PRN Hydroxyzine and Advil yesterday.   Psychiatric Team made the following recommendations yesterday: -Continue Zyprexa 15 mg BID for mood stability and psychosis -Continue Cymbalta 90 mg daily for depression and chronic pain -Continue Gabapentin 600 mg TID for anxiety and chronic pain -Continue Prazosin 1 mg QHS for nightmares -Stop 1:1 monitoring   On interview today patient reports she slept good last night.  She reports her appetite is doing good.  She reports no SI, HI, or AVH.  She reports no Paranoia or Ideas of Reference.  She reports no issues with her medications.  Discussed her outburst yesterday.  Discussed with her that going into the quiet room to use her coping skills was good but that her banging her head against the glass window was a negative coping skill.  Discussed with her that due to that she would have to spend another day on the [address] [place].  Encouraged her to talk with staff whenever she begins to feel overwhelmed or have thoughts of SI.  She reports understanding and has no other concerns present.   Principal Problem: Schizoaffective disorder, bipolar type (HCC) Diagnosis: Principal Problem:   Schizoaffective disorder,  bipolar type (HCC) Active Problems:   Borderline personality disorder (HCC)   Asperger syndrome   PTSD (post-traumatic stress disorder)   Asthma   Self-injurious behavior   Fibromyalgia   Prolonged QT interval  Total Time spent with patient:  I personally spent 35 minutes on the unit in direct patient care. The direct patient care time included face-to-face time with the patient, reviewing the patient's chart, communicating with other professionals, and coordinating care. Greater than 50% of this time was spent in counseling or coordinating care with the patient regarding goals of hospitalization, psycho-education, and discharge planning needs.   Past Psychiatric History: Schizoaffective Disorder, Bipolar Type, Borderline Personality Disorder, GAD, PTSD, and ASD, and multiple Suicide Attempts, history of Self Injurious Behavior (cutting), and multiple Psychiatric Hospitalizations (last [facility] 1024).  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    Chronic constipation    Depression    Drug-seeking behavior    Essential tremor    Headache    Ineffective individual coping 05/16/2022   Insomnia 01/12/2022   Intentional drug overdose (HCC) 06/05/2022   Neuromuscular disorder (HCC)    Normocytic anemia 06/05/2022   Overdose 07/22/2017   Overdose of acetaminophen 07/2017  and other meds   Overdose, intentional self-harm, initial encounter (HCC) 07/20/2021   Paranoia (HCC) 04/22/2021   Personality disorder (HCC)    Prolonged QT interval    Purposeful non-suicidal drug ingestion (HCC) 06/27/2021   Schizo-affective psychosis (HCC)     Schizoaffective disorder (HCC) 07/29/2022   Schizoaffective disorder, bipolar type (HCC)    Seizures (HCC)    Last seizure December 2017   Skin erythema 04/27/2022   Sleep apnea    Suicidal behavior 07/25/2021   Suicidal ideation    Suicide (HCC) 07/01/2021   Suicide attempt (HCC) 07/04/2021    Past Surgical History:  Procedure Laterality Date   MOUTH SURGERY  2009 or 2010   Family History:  Family History  Problem Relation Age of Onset   Mental illness Father    Asthma Father    PDD Brother    Seizures Brother    Family Psychiatric  History: Mental illness in her father; PDD in her brother; Seizures in her brother.  Social History:  Social History   Substance and Sexual Activity  Alcohol Use No   Alcohol/week: 1.0 standard drink of alcohol   Types: 1 Standard drinks or equivalent per week   Comment: denies at this time     Social History   Substance and Sexual Activity  Drug Use No   Comment: History of cocaine use at age 65 for 4 months    Social History   Socioeconomic History   Marital status: Widowed    Spouse name: Not on file   Number of children: 0   Years of education: Not on file   Highest education level: Not on file  Occupational History   Occupation: disability  Tobacco Use   Smoking status: Former    Types: Cigarettes   Smokeless tobacco: Never   Tobacco comments:    Smoked for 2  years age [age]  Vaping Use   Vaping status: Never Used  Substance and Sexual Activity   Alcohol use: No    Alcohol/week: 1.0 standard drink of alcohol    Types: 1 Standard drinks or equivalent per week    Comment: denies at this time   Drug use: No    Comment: History of cocaine use at age 38 for 4 months   Sexual activity: Not Currently    Birth control/protection: None  Other Topics Concern   Not on file  Social History Narrative   Marital status: Widowed      Children: daughter      Lives: with boyfriend, in two story home      Employment:  Disability       Tobacco: quit smoking; smoked for two years.      Alcohol ;none      Drugs: none   Has not traveled outside of the country.   Right handed         Patient with hx of Fibromyalgia,Orthostatic Tachycardia,Asthma,Arthritis,Gerd, ASD   Social Determinants of Health   Financial Resource Strain: Not on file  Food Insecurity: No Food Insecurity (12/25/2022)   Hunger Vital Sign    Worried About Running Out of Food in the Last Year: Never true    Ran Out of Food in the Last Year: Never true  Transportation Needs: No Transportation Needs (12/25/2022)   PRAPARE - Administrator, Civil Service (Medical): No    Lack of Transportation (Non-Medical): No  Physical Activity: Not on file  Stress: Not on file  Social Connections: Not on  file   Additional Social History:                         Sleep: Good  Appetite:  Good  Current Medications: Current Facility-Administered Medications  Medication Dose Route Frequency Provider Last Rate Last Admin   albuterol (PROVENTIL) (2.5 MG/3ML) 0.083% nebulizer solution 2.5 mg  2.5 mg Nebulization Q6H PRN Weber, Kyra A, NP       alum & mag hydroxide-simeth (MAALOX/MYLANTA) 200-200-20 MG/5ML suspension 30 mL  30 mL Oral Q4H PRN Weber, Bella Kennedy A, NP       bacitracin ointment   Topical BID Starleen Blue, NP   Given at 01/04/23 0806   diphenhydrAMINE (BENADRYL) capsule 50 mg  50 mg Oral TID PRN Phebe Colla A, NP   50 mg at 01/03/23 2122   Or   diphenhydrAMINE (BENADRYL) injection 50 mg  50 mg Intramuscular TID PRN Weber, Bella Kennedy A, NP   50 mg at 12/27/22 0912   DULoxetine (CYMBALTA) DR capsule 90 mg  90 mg Oral Daily Rex Kras, MD   90 mg at 01/04/23 1610   gabapentin (NEURONTIN) capsule 600 mg  600 mg Oral TID Rex Kras, MD   600 mg at 01/04/23 9604   hydrOXYzine (ATARAX) tablet 25 mg  25 mg Oral TID PRN Starleen Blue, NP   25 mg at 01/03/23 1901   ibuprofen (ADVIL) tablet 600 mg  600 mg Oral Q8H PRN Weber, Kyra A, NP    600 mg at 01/03/23 1312   LORazepam (ATIVAN) tablet 2 mg  2 mg Oral TID PRN Phebe Colla A, NP   2 mg at 01/03/23 2122   Or   LORazepam (ATIVAN) injection 2 mg  2 mg Intramuscular TID PRN Weber, Bella Kennedy A, NP   2 mg at 12/27/22 0912   magnesium hydroxide (MILK OF MAGNESIA) suspension 30 mL  30 mL Oral Daily PRN Weber, Kyra A, NP   30 mL at 12/23/22 1352   melatonin tablet 3 mg  3 mg Oral QHS Nkwenti, Doris, NP   3 mg at 01/03/23 2029   mometasone-formoterol (DULERA) 200-5 MCG/ACT inhaler 2 puff  2 puff Inhalation BID Ajibola, Ene A, NP   2 puff at 01/04/23 0807   OLANZapine (ZYPREXA) tablet 15 mg  15 mg Oral BID Lauro Franklin, MD   15 mg at 01/04/23 0806   ondansetron (ZOFRAN-ODT) disintegrating tablet 4 mg  4 mg Oral Q8H PRN Weber, Kyra A, NP   4 mg at 12/31/22 1357   pantoprazole (PROTONIX) EC tablet 40 mg  40 mg Oral QAC breakfast Weber, Kyra A, NP   40 mg at 01/04/23 0807   prazosin (MINIPRESS) capsule 1 mg  1 mg Oral QHS Weber, Kyra A, NP   1 mg at 01/03/23 2029   propafenone (RYTHMOL SR) 12 hr capsule 225 mg  225 mg Oral BID Lauro Franklin, MD   225 mg at 01/04/23 5409    Lab Results: No results found for this or any previous visit (from the past 48 hour(s)).  Blood Alcohol level:  Lab Results  Component Value Date   [facility] <10 12/18/2022   ETH <10 12/14/2022    Metabolic Disorder Labs: Lab Results  Component Value Date   HGBA1C 5.4 10/22/2022   MPG 108 10/22/2022   MPG 103 04/20/2022   Lab Results  Component Value Date   PROLACTIN 20.0 12/14/2022   PROLACTIN 66.2 (H) 03/02/2021   Lab Results  Component Value Date   CHOL 188 12/14/2022   TRIG 222 (H) 12/14/2022   HDL 41 12/14/2022   CHOLHDL 4.6 12/14/2022   VLDL 44 (H) 12/14/2022   LDLCALC 103 (H) 12/14/2022   LDLCALC 117 (H) 10/22/2022    Physical Findings: AIMS:  , ,  ,  ,    CIWA:    COWS:     Musculoskeletal: Strength & Muscle Tone: within normal limits Gait & Station: normal Patient leans:  N/A  Psychiatric Specialty Exam:  Presentation  General Appearance:  Appropriate for Environment; Casual  Eye Contact: Fair  Speech: Clear and Coherent; Normal Rate  Speech Volume: Normal  Handedness: Right   Mood and Affect  Mood: Dysphoric; Anxious  Affect: Congruent   Thought Process  Thought Processes: Coherent  Descriptions of Associations:Intact  Orientation:Full (Time, Place and Person)  Thought Content:WDL  History of Schizophrenia/Schizoaffective disorder:Yes  Duration of Psychotic Symptoms:Greater than six months  Hallucinations:Hallucinations: None  Ideas of Reference:None  Suicidal Thoughts:Suicidal Thoughts: No  Homicidal Thoughts:Homicidal Thoughts: No   Sensorium  Memory: Immediate Fair  Judgment: Intact  Insight: Present   Executive Functions  Concentration: Fair  Attention Span: Fair  Recall: [employer] of Knowledge: Fair  Language: Fair   Psychomotor Activity  Psychomotor Activity: Psychomotor Activity: Normal   Assets  Assets: Resilience   Sleep  Sleep: Sleep: Good Number of Hours of Sleep: 9.25    Physical Exam: Physical Exam Vitals and nursing note reviewed.  Constitutional:      General: She is not in acute distress.    Appearance: Normal appearance. She is obese. She is not ill-appearing or toxic-appearing.  HENT:     Head: Normocephalic and atraumatic.  Pulmonary:     Effort: Pulmonary effort is normal.  Musculoskeletal:        General: Normal range of motion.  Neurological:     General: No focal deficit present.     Mental Status: She is alert.    Review of Systems  Respiratory:  Negative for cough and shortness of breath.   Cardiovascular:  Negative for chest pain.  Gastrointestinal:  Negative for abdominal pain, constipation, diarrhea, nausea and vomiting.  Neurological:  Negative for dizziness, weakness and headaches.  Psychiatric/Behavioral:  Negative for depression,  hallucinations and suicidal ideas. The patient is nervous/anxious.    Blood pressure 111/88, pulse (!) 118, temperature 98.1 F (36.7 C), temperature source Oral, resp. rate 18, height 5\' 4"  (1.626 m), weight 112.5 kg, SpO2 100%. Body mass index is 42.57 kg/m.   Treatment Plan Summary: Daily contact with patient to assess and evaluate symptoms and progress in treatment and Medication management  Danyele Kersten Brighton is a 32 yr old female who presented on 11/8 to [facility] with complaints of SI with a plan to OD, she was admitted to [facility] on 11/14.  PPHx is significant for Schizoaffective Disorder, Bipolar Type, Borderline Personality Disorder, GAD, PTSD, and ASD, and multiple Suicide Attempts, history of Self Injurious Behavior (cutting), and multiple Psychiatric Hospitalizations (last [facility] 1024).   Shamanda was taken off 1:1 yesterday, however, she did have an outburst and episode of head banging.  Due to this we will not move her off the 500 unit at this time.  Encouraged her to use healthy coping skills and talk to staff if she has thoughts of SI again.  We will not make any changes to her medications at this time.  We will continue to monitor.   Schizoaffective Disorder, Bipolar Type: -  Continue Zyprexa 15 mg BID for mood stability and psychosis -Continue Cymbalta 90 mg daily for depression and chronic pain -Continue Gabapentin 600 mg TID for anxiety and chronic pain -Continue Prazosin 1 mg QHS for nightmares -Stop 1:1 monitoring -Continue Agitation Protocol: Ativan/Benadryl   Asthma: -Continue Dulera 2 puff BID -Continue Albuterol Nebuliation 2.5 mg q6 PRN wheezing   -Continue Propafenone 225 mg BID -Continue Protonix 40 mg daily -Continue Melatonin 3 mg QHS for insomnia -Continue PRN's: Ibuprofen, Maalox, Atarax, Milk of Magnesia, Trazodone  Repeat EKG on 11/20 showed Sinus Tach (108) with Qtc: 487   Lauro Franklin, MD 01/04/2023, 1:37 PM

## 2023-01-05 DIAGNOSIS — F250 Schizoaffective disorder, bipolar type: Secondary | ICD-10-CM | POA: Diagnosis not present

## 2023-01-05 MED ORDER — DICLOFENAC SODIUM 1 % EX GEL
4.0000 g | Freq: Four times a day (QID) | CUTANEOUS | Status: DC | PRN
Start: 2023-01-05 — End: 2023-01-15
  Administered 2023-01-06 – 2023-01-14 (×9): 4 g via TOPICAL
  Filled 2023-01-05: qty 100

## 2023-01-05 NOTE — Progress Notes (Signed)
   01/05/23 1000  Psych Admission Type (Psych Patients Only)  Admission Status Voluntary  Psychosocial Assessment  Patient Complaints Anxiety  Eye Contact Fair  Facial Expression Flat  Affect Anxious  Speech Logical/coherent  Interaction Assertive  Motor Activity Fidgety  Appearance/Hygiene Unremarkable  Behavior Characteristics Cooperative  Mood Anxious  Thought Process  Coherency Circumstantial  Content Blaming others  Delusions None reported or observed  Perception WDL  Hallucination None reported or observed  Judgment Poor  Confusion None  Danger to Self  Current suicidal ideation? Denies   Pt denies SI/HI/thoughts of self harm today. Pt set a goal to utilize coping skills and not engage in self injurious behavior today. Pt endorses anxiety regarding discharge planning.

## 2023-01-05 NOTE — Progress Notes (Signed)
Pt very anxious and apprehensive about possible discharge.  She still has many concerns about how she will do at home, feels strongly she will need assistance.  She outlined many health concerns she must take care of upon discharge and feels overwhelmed.  Pt did state that she does want to leave, does not feel that there will be benefit from further hospitalization here.    A.  Spoke with patient about taking one step at a time, prn given for anxiety as ordered.    R.  Pt presently in room appearing to sleep with snoring respirations.   01/05/23 2128  Psych Admission Type (Psych Patients Only)  Admission Status Voluntary  Psychosocial Assessment  Patient Complaints Anxiety  Eye Contact Fair  Facial Expression Anxious  Affect Anxious;Apprehensive  Speech Pressured;Rapid  Interaction Assertive  Motor Activity Fidgety  Appearance/Hygiene Unremarkable  Behavior Characteristics Anxious  Mood Anxious;Labile  Thought Process  Coherency Circumstantial  Content Blaming others  Delusions None reported or observed  Perception WDL  Hallucination None reported or observed  Judgment Poor  Confusion None  Danger to Self  Current suicidal ideation? Denies  Self-Injurious Behavior No self-injurious ideation or behavior indicators observed or expressed   Agreement Not to Harm Self Yes  Description of Agreement verbal  Danger to Others  Danger to Others None reported or observed  Danger to Others Abnormal  Harmful Behavior to others No threats or harm toward other people  Destructive Behavior No threats or harm toward property

## 2023-01-05 NOTE — Progress Notes (Signed)
  Patient presented with moderate anxiety prior to medication being administered.  Administered PRN Hydroxyzine per [facility] per patient request.

## 2023-01-05 NOTE — Progress Notes (Signed)
  Patient presented to nurse's station with acid reflux and constipation.  Patient requested Milk of Magnesia and a cup of water.  Administered PRN Milk of Magnesia per [facility] per patient request.

## 2023-01-05 NOTE — Plan of Care (Signed)
Problem: Safety: Goal: Periods of time without injury will increase Outcome: Progressing   Problem: Activity: Goal: Will identify at least one activity in which they can participate Outcome: Progressing   Problem: Self-Concept: Goal: Will verbalize positive feelings about self Outcome: Progressing   Pt transferred from 500 to 400 this shift. Initially denies SI, HI, AVH and pain when assessed. Visible in milieu with blunted affect, irritable on interactions as shift progressed. Verbalized concerns about d/c plan. Per pt "No one cares, they want to d/c me tomorrow when I'm not ready. I told them to send me to [facility], they didn't do it. My family tells me I won't do good. I will not get my daughter back. I want to go to [facility]". Went to her room and started to push the STARR alarm twice. Tearful when  engaged; verbal redirected without incident. Visible in hallway pacing at intervals and dayroom for scheduled groups. Support, encouragement and reassurance offered. Tolerated fluids and meals well. Safety maintained at Q 15 minutes intervals.

## 2023-01-05 NOTE — Progress Notes (Signed)
   01/05/23 0625  Vital Signs  Temp (!) 97.3 F (36.3 C)  Temp Source Oral  Pulse Rate (!) 113  Pulse Rate Source Monitor  Resp 20  BP (!) 147/103  BP Location Right Arm  BP Method Automatic  Patient Position (if appropriate) Sitting  Oxygen Therapy  SpO2 100 %  O2 Device Room Air     01/05/23 0626  Vital Signs  Pulse Rate (!) 122  Pulse Rate Source Monitor  BP (!) 143/130  BP Location Right Arm  BP Method Automatic  Patient Position (if appropriate) Standing  Oxygen Therapy  SpO2 100 %  O2 Device Room Air   Patient BP is elevated.  Patient reported needing to use the bathroom.  Patient is asymptomatic.  Will retake BP.

## 2023-01-05 NOTE — Progress Notes (Signed)
   01/05/23 1550  Obs Status Letter  Medicare Obs Status Letter given? Yes  Condition Code 44 Given: Yes  Patient signature on CC44 notice: Yes   Somara Frymire LCSWA

## 2023-01-05 NOTE — [facility] Group Notes (Signed)
[facility] Group Notes:  (Nursing/MHT/Case Management/Adjunct)  Date:  01/05/2023  Time: 2000  Type of Therapy:   wrap up group  Participation Level:  Active  Participation Quality:  Appropriate  Affect:  Appropriate  Cognitive:  Alert and Appropriate  Insight:  Appropriate  Engagement in Group:  Engaged  Modes of Intervention:  Clarification, Education, and Support  Summary of Progress/Problems: Pt stated that she rate her day a 9.5. Pt. Stated she pull the  call bell twice today but apologize to staff and that she been on track after that. Pt stated she is excited about been discharge tomorrow.  Fay Records 01/05/2023, 8:43 PM

## 2023-01-05 NOTE — Group Note (Signed)
LCSW Group Therapy Note  Group Date: 01/05/2023 Start Time: 1000 End Time: 1100   Type of Therapy and Topic:  Group Therapy - Healthy vs Unhealthy Coping Skills  Participation Level:  Active   Description of Group The focus of this group was to determine what unhealthy coping techniques typically are used by group members and what healthy coping techniques would be helpful in coping with various problems. Patients were guided in becoming aware of the differences between healthy and unhealthy coping techniques. Patients were asked to identify 2-3 healthy coping skills they would like to learn to use more effectively.  Therapeutic Goals Patients learned that coping is what human beings do all day long to deal with various situations in their lives Patients defined and discussed healthy vs unhealthy coping techniques Patients identified their preferred coping techniques and identified whether these were healthy or unhealthy Patients determined 2-3 healthy coping skills they would like to become more familiar with and use more often. Patients provided support and ideas to each other   Summary of Patient Progress:  During group, Deshanda expressed cutting is her unhealthy coping tool while diamond painting and spending time with her daughter are healthy for her. Patient proved open to input from peers and feedback from CSW. Patient demonstrated fair insight into the subject matter, was respectful of peers, and participated throughout the entire session.  She did not remain for the entire session.   Therapeutic Modalities Cognitive Behavioral Therapy Motivational Interviewing  Burnard Bunting 01/05/2023  1:07 PM

## 2023-01-05 NOTE — [facility] Group Notes (Signed)
[facility] Group Notes:  (Nursing/MHT/Case Management/Adjunct)  Date:  01/05/2023  Time:  2:36 PM  Type of Therapy:  Psychoeducational Skills  Participation Level:  Did Not Attend  Participation Quality:   na  Affect:    Cognitive:    Insight:    Engagement in Group:    Modes of Intervention:    Summary of Progress/Problems: Psychoeducational group in which patients were educated on the impact of negative thinking, positive reframing and mindfulness. Pt did not attend despite enocuragement.  Malva Limes 01/05/2023, 2:36 PM

## 2023-01-05 NOTE — Progress Notes (Signed)
   01/05/23 0530  15 Minute Checks  Location Bedroom  Visual Appearance Calm  Behavior Sleeping  Sleep (Behavioral Health Patients Only)  Calculate sleep? (Click Yes once per 24 hr at 0600 safety check) Yes  Documented sleep last 24 hours 9.5

## 2023-01-05 NOTE — Progress Notes (Signed)
   01/05/23 1610  Vital Signs  Pulse Rate (!) 110  Pulse Rate Source Monitor  BP 127/76  BP Location Right Arm  BP Method Automatic  Patient Position (if appropriate) Sitting  Oxygen Therapy  SpO2 100 %  O2 Device Room Air     01/05/23 0634  Vital Signs  Pulse Rate (!) 122  Pulse Rate Source Monitor  BP 124/84  BP Location Right Arm  BP Method Automatic  Patient Position (if appropriate) Standing  Oxygen Therapy  SpO2 100 %  O2 Device Room Air    BP is normal.  Patient presents with moderate anxiety. Administered PRN Hydroxyzine per [facility] per patient request.

## 2023-01-05 NOTE — Progress Notes (Signed)
[facility] MD Progress Note  01/05/2023 10:05 AM [name]  MRN:  [id number] Subjective:   [name] is a 32 yr old female who presented on 11/8 to [facility] with complaints of SI with a plan to OD, she was admitted to [facility] on 11/14.  PPHx is significant for Schizoaffective Disorder, Bipolar Type, Borderline Personality Disorder, GAD, PTSD, and ASD, and multiple Suicide Attempts, history of Self Injurious Behavior (cutting), and multiple Psychiatric Hospitalizations (last [facility] 1024).   Case was discussed in the multidisciplinary team. MAR was reviewed and patient was compliant with medications.    Psychiatric Team made the following recommendations yesterday: -Continue Zyprexa 15 mg BID for mood stability and psychosis -Continue Cymbalta 90 mg daily for depression and chronic pain -Continue Gabapentin 600 mg TID for anxiety and chronic pain -Continue Prazosin 1 mg QHS for nightmares    On interview today patient reports she slept good last night.  She reports her appetite is doing good.  She reports no SI, HI, or AVH.  She reports no Paranoia or Ideas of Reference.  She reports no issues with her medications.  Discussed with her that since she was able to manage her emotions yesterday and not have any outbursts she would be able to step down to the 400 hall.  Discussed with her that if she is able to continue to not have outbursts then discharging tomorrow would be possible.  Encouraged her to reach out to staff if she begins to have overwhelming emotions and she can go to the quiet room if needed.  Encouraged her to use her positive coping skills.  She requested that Social Work alk with her about the [facility] program as she had some additional questions.  She reports no other concerns at present.    Principal Problem: Schizoaffective disorder, bipolar type (HCC) Diagnosis: Principal Problem:   Schizoaffective disorder, bipolar type (HCC) Active Problems:   Borderline  personality disorder (HCC)   Asperger syndrome   PTSD (post-traumatic stress disorder)   Asthma   Self-injurious behavior   Fibromyalgia   Prolonged QT interval  Total Time spent with patient:  I personally spent 35 minutes on the unit in direct patient care. The direct patient care time included face-to-face time with the patient, reviewing the patient's chart, communicating with other professionals, and coordinating care. Greater than 50% of this time was spent in counseling or coordinating care with the patient regarding goals of hospitalization, psycho-education, and discharge planning needs.   Past Psychiatric History: Schizoaffective Disorder, Bipolar Type, Borderline Personality Disorder, GAD, PTSD, and ASD, and multiple Suicide Attempts, history of Self Injurious Behavior (cutting), and multiple Psychiatric Hospitalizations (last [facility] 1024).  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    Chronic constipation    Depression    Drug-seeking behavior    Essential tremor    Headache    Ineffective individual coping 05/16/2022   Insomnia 01/12/2022   Intentional drug overdose (HCC) 06/05/2022   Neuromuscular disorder (HCC)    Normocytic anemia 06/05/2022   Overdose 07/22/2017   Overdose of acetaminophen 07/2017   and other  meds   Overdose, intentional self-harm, initial encounter (HCC) 07/20/2021   Paranoia (HCC) 04/22/2021   Personality disorder (HCC)    Prolonged QT interval    Purposeful non-suicidal drug ingestion (HCC) 06/27/2021   Schizo-affective psychosis (HCC)    Schizoaffective disorder (HCC) 07/29/2022   Schizoaffective  disorder, bipolar type (HCC)    Seizures (HCC)    Last seizure December 2017   Skin erythema 04/27/2022   Sleep apnea    Suicidal behavior 07/25/2021   Suicidal ideation    Suicide (HCC) 07/01/2021   Suicide attempt (HCC) 07/04/2021    Past Surgical History:  Procedure Laterality Date   MOUTH SURGERY  2009 or 2010   Family History:  Family History  Problem Relation Age of Onset   Mental illness Father    Asthma Father    PDD Brother    Seizures Brother    Family Psychiatric  History: Mental illness in her father; PDD in her brother; Seizures in her brother.  Social History:  Social History   Substance and Sexual Activity  Alcohol Use No   Alcohol/week: 1.0 standard drink of alcohol   Types: 1 Standard drinks or equivalent per week   Comment: denies at this time     Social History   Substance and Sexual Activity  Drug Use No   Comment: History of cocaine use at age 37 for 4 months    Social History   Socioeconomic History   Marital status: Widowed    Spouse name: Not on file   Number of children: 0   Years of education: Not on file   Highest education level: Not on file  Occupational History   Occupation: disability  Tobacco Use   Smoking status: Former    Types: Cigarettes   Smokeless tobacco: Never   Tobacco comments:    Smoked for 2  years age [age]  Vaping Use   Vaping status: Never Used  Substance and Sexual Activity   Alcohol use: No    Alcohol/week: 1.0 standard drink of alcohol    Types: 1 Standard drinks or equivalent per week    Comment: denies at this time   Drug use: No    Comment: History of cocaine use at age 1 for 4 months   Sexual activity: Not Currently    Birth control/protection: None  Other Topics Concern   Not on file  Social History Narrative   Marital status: Widowed      Children: daughter      Lives: with boyfriend, in two story home      Employment:  Disability      Tobacco: quit smoking; smoked for two years.       Alcohol ;none      Drugs: none   Has not traveled outside of the country.   Right handed         Patient with hx of Fibromyalgia,Orthostatic Tachycardia,Asthma,Arthritis,Gerd, ASD   Social Determinants of Health   Financial Resource Strain: Not on file  Food Insecurity: No Food Insecurity (12/25/2022)   Hunger Vital Sign    Worried About Running Out of Food in the Last Year: Never true    Ran Out of Food in the Last Year: Never true  Transportation Needs: No Transportation Needs (12/25/2022)   PRAPARE - Administrator, Civil Service (Medical): No    Lack of Transportation (Non-Medical): No  Physical Activity: Not on file  Stress: Not on file  Social Connections: Not on file  Additional Social History:                         Sleep: Good  Appetite:  Good  Current Medications: Current Facility-Administered Medications  Medication Dose Route Frequency Provider Last Rate Last Admin   albuterol (PROVENTIL) (2.5 MG/3ML) 0.083% nebulizer solution 2.5 mg  2.5 mg Nebulization Q6H PRN Weber, Kyra A, NP       alum & mag hydroxide-simeth (MAALOX/MYLANTA) 200-200-20 MG/5ML suspension 30 mL  30 mL Oral Q4H PRN Weber, Bella Kennedy A, NP       bacitracin ointment   Topical BID Starleen Blue, NP   Given at 01/05/23 2952   diphenhydrAMINE (BENADRYL) capsule 50 mg  50 mg Oral TID PRN Phebe Colla A, NP   50 mg at 01/03/23 2122   Or   diphenhydrAMINE (BENADRYL) injection 50 mg  50 mg Intramuscular TID PRN Weber, Bella Kennedy A, NP   50 mg at 12/27/22 0912   DULoxetine (CYMBALTA) DR capsule 90 mg  90 mg Oral Daily Rex Kras, MD   90 mg at 01/05/23 0726   gabapentin (NEURONTIN) capsule 600 mg  600 mg Oral TID Rex Kras, MD   600 mg at 01/05/23 8413   hydrOXYzine (ATARAX) tablet 25 mg  25 mg Oral TID PRN Starleen Blue, NP   25 mg at 01/05/23 2440   ibuprofen (ADVIL) tablet 600 mg  600 mg Oral Q8H PRN Weber, Kyra A, NP   600 mg at 01/03/23 1312   LORazepam (ATIVAN) tablet 2 mg   2 mg Oral TID PRN Phebe Colla A, NP   2 mg at 01/03/23 2122   Or   LORazepam (ATIVAN) injection 2 mg  2 mg Intramuscular TID PRN Weber, Bella Kennedy A, NP   2 mg at 12/27/22 0912   magnesium hydroxide (MILK OF MAGNESIA) suspension 30 mL  30 mL Oral Daily PRN Weber, Kyra A, NP   30 mL at 01/05/23 0252   melatonin tablet 3 mg  3 mg Oral QHS Nkwenti, Doris, NP   3 mg at 01/04/23 2025   mometasone-formoterol (DULERA) 200-5 MCG/ACT inhaler 2 puff  2 puff Inhalation BID Ajibola, Ene A, NP   2 puff at 01/05/23 0726   OLANZapine (ZYPREXA) tablet 15 mg  15 mg Oral BID Lauro Franklin, MD   15 mg at 01/05/23 0726   ondansetron (ZOFRAN-ODT) disintegrating tablet 4 mg  4 mg Oral Q8H PRN Weber, Kyra A, NP   4 mg at 12/31/22 1357   pantoprazole (PROTONIX) EC tablet 40 mg  40 mg Oral QAC breakfast Weber, Kyra A, NP   40 mg at 01/05/23 0626   prazosin (MINIPRESS) capsule 1 mg  1 mg Oral QHS Weber, Kyra A, NP   1 mg at 01/04/23 2025   propafenone (RYTHMOL SR) 12 hr capsule 225 mg  225 mg Oral BID Lauro Franklin, MD   225 mg at 01/05/23 1027    Lab Results: No results found for this or any previous visit (from the past 48 hour(s)).  Blood Alcohol level:  Lab Results  Component Value Date   [facility] <10 12/18/2022   ETH <10 12/14/2022    Metabolic Disorder Labs: Lab Results  Component Value Date   HGBA1C 5.4 10/22/2022   MPG 108 10/22/2022   MPG 103 04/20/2022   Lab Results  Component Value Date   PROLACTIN 20.0 12/14/2022   PROLACTIN 66.2 (H) 03/02/2021   Lab Results  Component Value  Date   CHOL 188 12/14/2022   TRIG 222 (H) 12/14/2022   HDL 41 12/14/2022   CHOLHDL 4.6 12/14/2022   VLDL 44 (H) 12/14/2022   LDLCALC 103 (H) 12/14/2022   LDLCALC 117 (H) 10/22/2022    Physical Findings: AIMS:  , ,  ,  ,    CIWA:    COWS:     Musculoskeletal: Strength & Muscle Tone: within normal limits Gait & Station: normal Patient leans: N/A  Psychiatric Specialty Exam:  Presentation  General  Appearance:  Appropriate for Environment; Casual  Eye Contact: Fair  Speech: Clear and Coherent; Normal Rate  Speech Volume: Normal  Handedness: Right   Mood and Affect  Mood: Anxious  Affect: Congruent   Thought Process  Thought Processes: Coherent  Descriptions of Associations:Intact  Orientation:Full (Time, Place and Person)  Thought Content:WDL  History of Schizophrenia/Schizoaffective disorder:Yes  Duration of Psychotic Symptoms:Greater than six months  Hallucinations:Hallucinations: None  Ideas of Reference:None  Suicidal Thoughts:Suicidal Thoughts: No  Homicidal Thoughts:Homicidal Thoughts: No   Sensorium  Memory: Immediate Fair; Recent Fair  Judgment: Intact  Insight: Present   Executive Functions  Concentration: Fair  Attention Span: Fair  Recall: [employer] of Knowledge: Fair  Language: Fair   Psychomotor Activity  Psychomotor Activity: Psychomotor Activity: Normal   Assets  Assets: Resilience   Sleep  Sleep: Sleep: Good Number of Hours of Sleep: 9.5    Physical Exam: Physical Exam Vitals and nursing note reviewed.  Constitutional:      General: She is not in acute distress.    Appearance: Normal appearance. She is obese. She is not ill-appearing or toxic-appearing.  HENT:     Head: Normocephalic and atraumatic.  Pulmonary:     Effort: Pulmonary effort is normal.  Musculoskeletal:        General: Normal range of motion.  Neurological:     General: No focal deficit present.     Mental Status: She is alert.    Review of Systems  Respiratory:  Negative for cough and shortness of breath.   Cardiovascular:  Negative for chest pain.  Gastrointestinal:  Negative for abdominal pain, constipation, diarrhea, nausea and vomiting.  Neurological:  Negative for dizziness, weakness and headaches.  Psychiatric/Behavioral:  Negative for depression, hallucinations and suicidal ideas. The patient is not  nervous/anxious.    Blood pressure 124/84, pulse (!) 122, temperature (!) 97.3 F (36.3 C), temperature source Oral, resp. rate 20, height 5\' 4"  (1.626 m), weight 112.5 kg, SpO2 100%. Body mass index is 42.57 kg/m.   Treatment Plan Summary: Daily contact with patient to assess and evaluate symptoms and progress in treatment and Medication management  [name] is a 32 yr old female who presented on 11/8 to [facility] with complaints of SI with a plan to OD, she was admitted to [facility] on 11/14.  PPHx is significant for Schizoaffective Disorder, Bipolar Type, Borderline Personality Disorder, GAD, PTSD, and ASD, and multiple Suicide Attempts, history of Self Injurious Behavior (cutting), and multiple Psychiatric Hospitalizations (last [facility] 1024).   [name] has been able self regulate so we will move her onto the 400 hall.  If she continues to self regulate then discharge tomorrow is possible.  Social Work will talk with her about the [facility] program.  We will not make any changes to her medications at this time.  We will continue to monitor.    Schizoaffective Disorder, Bipolar Type: -Continue Zyprexa 15 mg BID for mood stability and psychosis -Continue Cymbalta  90 mg daily for depression and chronic pain -Continue Gabapentin 600 mg TID for anxiety and chronic pain -Continue Prazosin 1 mg QHS for nightmares -Continue Agitation Protocol: Ativan/Benadryl   Asthma: -Continue Dulera 2 puff BID -Continue Albuterol Nebuliation 2.5 mg q6 PRN wheezing   -Continue Propafenone 225 mg BID -Continue Protonix 40 mg daily -Continue Melatonin 3 mg QHS for insomnia -Continue PRN's: Ibuprofen, Maalox, Atarax, Milk of Magnesia, Trazodone  Repeat EKG on 11/20 showed Sinus Tach (108) with Qtc: 487   Lauro Franklin, MD 01/05/2023, 10:05 AM

## 2023-01-06 DIAGNOSIS — F250 Schizoaffective disorder, bipolar type: Secondary | ICD-10-CM | POA: Diagnosis not present

## 2023-01-06 NOTE — Progress Notes (Signed)
Nykeba came up this morning hearing voices and states "I am not feeling safe, I feel like I am going to hurt myself.  Pt states that if she goes home there is a possibility that she could harm herself up to and including death.  Pt appears very tremulous and paranoid.    Medication will be provided prn and will make provider aware.

## 2023-01-06 NOTE — Progress Notes (Signed)
Pt transferred  to 500 hall by Dr. Demetrius Charity.  Pt and peer on 500 hall started to have verbal altercations and patients were separated with Dion being allowed to remain in the dayroom while peer was deescalated in her room.  This pt was then redirected and verbally deescalated by Dr. Demetrius Charity.  Pt was offered PRN medication but refused. She did ask for ibuprofen for back pain.   Pt acclimated to milieu and room. Staff will cont to monitor for safety.

## 2023-01-06 NOTE — Progress Notes (Signed)
MHT positioned outside of Pt's room to ensure safety.  Pt presently in room appearing to respond to internal stimuli.  Pt can be heard talking constantly, with frequent movements in bed.  Will continue to monitor closely.

## 2023-01-06 NOTE — Progress Notes (Signed)
   01/06/23 0528  15 Minute Checks  Location Bedroom  Visual Appearance Calm  Behavior Sleeping  Sleep (Behavioral Health Patients Only)  Calculate sleep? (Click Yes once per 24 hr at 0600 safety check) Yes  Documented sleep last 24 hours 8.5

## 2023-01-06 NOTE — Progress Notes (Signed)
   01/06/23 2200  Psych Admission Type (Psych Patients Only)  Admission Status Voluntary  Psychosocial Assessment  Patient Complaints Anxiety  Eye Contact Fair  Facial Expression Anxious  Affect Appropriate to circumstance  Speech Pressured;Rapid  Interaction Assertive  Motor Activity Fidgety  Appearance/Hygiene Unremarkable  Behavior Characteristics Cooperative;Appropriate to situation  Mood Anxious  Thought Process  Coherency Circumstantial  Content Blaming others  Delusions None reported or observed  Perception WDL  Hallucination None reported or observed  Judgment Poor  Confusion None  Danger to Self  Current suicidal ideation? Denies  Self-Injurious Behavior No self-injurious ideation or behavior indicators observed or expressed   Agreement Not to Harm Self Yes  Description of Agreement verbal  Danger to Others  Danger to Others None reported or observed  Danger to Others Abnormal  Harmful Behavior to others No threats or harm toward other people  Destructive Behavior No threats or harm toward property

## 2023-01-06 NOTE — Progress Notes (Signed)
[facility] MD Progress Note  01/06/2023 10:48 AM [name]  MRN:  [id number] Subjective:   [name] is a 32 yr old female who presented on 11/8 to [facility] with complaints of SI with a plan to OD, she was admitted to [facility] on 11/14.  PPHx is significant for Schizoaffective Disorder, Bipolar Type, Borderline Personality Disorder, GAD, PTSD, and ASD, and multiple Suicide Attempts, history of Self Injurious Behavior (cutting), and multiple Psychiatric Hospitalizations (last [facility] 1024).   Case was discussed in the multidisciplinary team. MAR was reviewed and patient was compliant with medications. She received PRN Hydroxyzine and Advil yesterday.  She acted out last night and threatened to hurt herself by banging her head or scratching herself and she received Agitation PRN's of Benadryl and Ativan.   Psychiatric Team made the following recommendations yesterday: -Continue Zyprexa 15 mg BID for mood stability and psychosis -Continue Cymbalta 90 mg daily for depression and chronic pain -Continue Gabapentin 600 mg TID for anxiety and chronic pain -Continue Prazosin 1 mg QHS for nightmares    On interview today patient reports she slept fair last night.  She reports her appetite is doing good.  She reports having SI- thoughts of scratching herself.  She reports HI- wanting to hurt her family.  She reports AH- Kathie Rhodes telling her terrible things.  She reports no VH.  She reports no Paranoia or Ideas of Reference.  She reports no issues with her medications.  She reports that she is feeling overwhelmed and this is what caused her her to act out.  She reports she wants to go to [facility] because they have the best groups to help her.  Encouraged her to focus on her positive coping skills.  She requests a stress ball and this was provided.  Encouraged her to reach out to staff when these thoughts of self harm get stronger.  She reports no other concerns at present.    Principal Problem:  Schizoaffective disorder, bipolar type (HCC) Diagnosis: Principal Problem:   Schizoaffective disorder, bipolar type (HCC) Active Problems:   Borderline personality disorder (HCC)   Asperger syndrome   PTSD (post-traumatic stress disorder)   Asthma   Self-injurious behavior   Fibromyalgia   Prolonged QT interval  Total Time spent with patient:  I personally spent 35 minutes on the unit in direct patient care. The direct patient care time included face-to-face time with the patient, reviewing the patient's chart, communicating with other professionals, and coordinating care. Greater than 50% of this time was spent in counseling or coordinating care with the patient regarding goals of hospitalization, psycho-education, and discharge planning needs.   Past Psychiatric History: Schizoaffective Disorder, Bipolar Type, Borderline Personality Disorder, GAD, PTSD, and ASD, and multiple Suicide Attempts, history of Self Injurious Behavior (cutting), and multiple Psychiatric Hospitalizations (last [facility] 1024).  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    Chronic constipation    Depression    Drug-seeking behavior    Essential tremor    Headache    Ineffective individual coping 05/16/2022   Insomnia 01/12/2022   Intentional drug overdose (HCC) 06/05/2022  Neuromuscular disorder (HCC)    Normocytic anemia 06/05/2022   Overdose 07/22/2017   Overdose of acetaminophen 07/2017   and other meds   Overdose, intentional self-harm, initial encounter (HCC) 07/20/2021   Paranoia (HCC) 04/22/2021   Personality disorder (HCC)    Prolonged QT interval     Purposeful non-suicidal drug ingestion (HCC) 06/27/2021   Schizo-affective psychosis (HCC)    Schizoaffective disorder (HCC) 07/29/2022   Schizoaffective disorder, bipolar type (HCC)    Seizures (HCC)    Last seizure December 2017   Skin erythema 04/27/2022   Sleep apnea    Suicidal behavior 07/25/2021   Suicidal ideation    Suicide (HCC) 07/01/2021   Suicide attempt (HCC) 07/04/2021    Past Surgical History:  Procedure Laterality Date   MOUTH SURGERY  2009 or 2010   Family History:  Family History  Problem Relation Age of Onset   Mental illness Father    Asthma Father    PDD Brother    Seizures Brother    Family Psychiatric  History: Mental illness in her father; PDD in her brother; Seizures in her brother.  Social History:  Social History   Substance and Sexual Activity  Alcohol Use No   Alcohol/week: 1.0 standard drink of alcohol   Types: 1 Standard drinks or equivalent per week   Comment: denies at this time     Social History   Substance and Sexual Activity  Drug Use No   Comment: History of cocaine use at age 69 for 4 months    Social History   Socioeconomic History   Marital status: Widowed    Spouse name: Not on file   Number of children: 0   Years of education: Not on file   Highest education level: Not on file  Occupational History   Occupation: disability  Tobacco Use   Smoking status: Former    Types: Cigarettes   Smokeless tobacco: Never   Tobacco comments:    Smoked for 2  years age [age]  Vaping Use   Vaping status: Never Used  Substance and Sexual Activity   Alcohol use: No    Alcohol/week: 1.0 standard drink of alcohol    Types: 1 Standard drinks or equivalent per week    Comment: denies at this time   Drug use: No    Comment: History of cocaine use at age 82 for 4 months   Sexual activity: Not Currently    Birth control/protection: None  Other Topics Concern   Not on file  Social History Narrative   Marital status: Widowed       Children: daughter      Lives: with boyfriend, in two story home      Employment:  Disability      Tobacco: quit smoking; smoked for two years.      Alcohol ;none      Drugs: none   Has not traveled outside of the country.   Right handed         Patient with hx of Fibromyalgia,Orthostatic Tachycardia,Asthma,Arthritis,Gerd, ASD   Social Determinants of Health   Financial Resource Strain: Not on file  Food Insecurity: No Food Insecurity (12/25/2022)   Hunger Vital Sign    Worried About Running Out of Food in the Last Year: Never true    Ran Out of Food in the Last Year: Never true  Transportation Needs: No Transportation Needs (12/25/2022)   PRAPARE - Administrator, Civil Service (Medical): No  Lack of Transportation (Non-Medical): No  Physical Activity: Not on file  Stress: Not on file  Social Connections: Not on file   Additional Social History:                         Sleep: Good  Appetite:  Good  Current Medications: Current Facility-Administered Medications  Medication Dose Route Frequency Provider Last Rate Last Admin   albuterol (PROVENTIL) (2.5 MG/3ML) 0.083% nebulizer solution 2.5 mg  2.5 mg Nebulization Q6H PRN Weber, Kyra A, NP       alum & mag hydroxide-simeth (MAALOX/MYLANTA) 200-200-20 MG/5ML suspension 30 mL  30 mL Oral Q4H PRN Weber, Kyra A, NP       bacitracin ointment   Topical BID Starleen Blue, NP   Given at 01/06/23 1610   diclofenac Sodium (VOLTAREN) 1 % topical gel 4 g  4 g Topical QID PRN Jearld Lesch, NP   4 g at 01/06/23 0814   diphenhydrAMINE (BENADRYL) capsule 50 mg  50 mg Oral TID PRN Phebe Colla A, NP   50 mg at 01/06/23 0408   Or   diphenhydrAMINE (BENADRYL) injection 50 mg  50 mg Intramuscular TID PRN Weber, Kyra A, NP   50 mg at 12/27/22 0912   DULoxetine (CYMBALTA) DR capsule 90 mg  90 mg Oral Daily Rex Kras, MD   90 mg at 01/06/23 0813   gabapentin (NEURONTIN) capsule 600 mg  600 mg Oral TID Rex Kras, MD   600 mg at 01/06/23 0813   hydrOXYzine (ATARAX) tablet 25 mg  25 mg Oral TID PRN Starleen Blue, NP   25 mg at 01/05/23 2033   ibuprofen (ADVIL) tablet 600 mg  600 mg Oral Q8H PRN Weber, Kyra A, NP   600 mg at 01/06/23 9604   LORazepam (ATIVAN) tablet 2 mg  2 mg Oral TID PRN Phebe Colla A, NP   2 mg at 01/06/23 0408   Or   LORazepam (ATIVAN) injection 2 mg  2 mg Intramuscular TID PRN Weber, Bella Kennedy A, NP   2 mg at 12/27/22 0912   magnesium hydroxide (MILK OF MAGNESIA) suspension 30 mL  30 mL Oral Daily PRN Weber, Kyra A, NP   30 mL at 01/05/23 0252   melatonin tablet 3 mg  3 mg Oral QHS Nkwenti, Doris, NP   3 mg at 01/05/23 2033   mometasone-formoterol (DULERA) 200-5 MCG/ACT inhaler 2 puff  2 puff Inhalation BID Ajibola, Ene A, NP   2 puff at 01/06/23 0813   OLANZapine (ZYPREXA) tablet 15 mg  15 mg Oral BID Lauro Franklin, MD   15 mg at 01/06/23 0621   ondansetron (ZOFRAN-ODT) disintegrating tablet 4 mg  4 mg Oral Q8H PRN Weber, Kyra A, NP   4 mg at 12/31/22 1357   pantoprazole (PROTONIX) EC tablet 40 mg  40 mg Oral QAC breakfast Weber, Kyra A, NP   40 mg at 01/06/23 5409   prazosin (MINIPRESS) capsule 1 mg  1 mg Oral QHS Weber, Kyra A, NP   1 mg at 01/05/23 2033   propafenone (RYTHMOL SR) 12 hr capsule 225 mg  225 mg Oral BID Lauro Franklin, MD   225 mg at 01/06/23 8119    Lab Results: No results found for this or any previous visit (from the past 48 hour(s)).  Blood Alcohol level:  Lab Results  Component Value Date   ETH <10 12/18/2022   ETH <10 12/14/2022  Metabolic Disorder Labs: Lab Results  Component Value Date   HGBA1C 5.4 10/22/2022   MPG 108 10/22/2022   MPG 103 04/20/2022   Lab Results  Component Value Date   PROLACTIN 20.0 12/14/2022   PROLACTIN 66.2 (H) 03/02/2021   Lab Results  Component Value Date   CHOL 188 12/14/2022   TRIG 222 (H) 12/14/2022   HDL 41 12/14/2022   CHOLHDL 4.6 12/14/2022   VLDL 44 (H) 12/14/2022   LDLCALC 103 (H)  12/14/2022   LDLCALC 117 (H) 10/22/2022    Physical Findings: AIMS:  , ,  ,  ,    CIWA:    COWS:     Musculoskeletal: Strength & Muscle Tone: within normal limits Gait & Station: normal Patient leans: N/A  Psychiatric Specialty Exam:  Presentation  General Appearance:  Appropriate for Environment; Casual  Eye Contact: Poor  Speech: Clear and Coherent; Normal Rate  Speech Volume: Normal  Handedness: Right   Mood and Affect  Mood: Anxious; Depressed  Affect: Congruent; Constricted   Thought Process  Thought Processes: Coherent  Descriptions of Associations:Intact  Orientation:Full (Time, Place and Person)  Thought Content:WDL  History of Schizophrenia/Schizoaffective disorder:Yes  Duration of Psychotic Symptoms:Greater than six months  Hallucinations:Hallucinations: Auditory Description of Auditory Hallucinations: voices telling her she is worthless  Ideas of Reference:None  Suicidal Thoughts:Suicidal Thoughts: Yes, Active SI Active Intent and/or Plan: -- (thoughts of scratching herself)  Homicidal Thoughts:Homicidal Thoughts: Yes, Active (wanting to hurt family)   Sensorium  Memory: Immediate Fair; Recent Fair  Judgment: Poor  Insight: Shallow   Executive Functions  Concentration: Fair  Attention Span: Fair  Recall: [employer] of Knowledge: Fair  Language: Fair   Psychomotor Activity  Psychomotor Activity: Psychomotor Activity: Normal   Assets  Assets: Resilience   Sleep  Sleep: Sleep: Fair Number of Hours of Sleep: 8.5    Physical Exam: Physical Exam Review of Systems  Respiratory:  Negative for cough and shortness of breath.   Cardiovascular:  Negative for chest pain.  Gastrointestinal:  Negative for abdominal pain, constipation, diarrhea, nausea and vomiting.  Neurological:  Negative for dizziness, weakness and headaches.  Psychiatric/Behavioral:  Positive for depression, hallucinations (AH) and  suicidal ideas. The patient is nervous/anxious.    Blood pressure (!) 129/58, pulse (!) 116, temperature 98.7 F (37.1 C), temperature source Oral, resp. rate 20, height 5\' 4"  (1.626 m), weight 112.5 kg, SpO2 100%. Body mass index is 42.57 kg/m.   Treatment Plan Summary: Daily contact with patient to assess and evaluate symptoms and progress in treatment and Medication management  [name] is a 32 yr old female who presented on 11/8 to [facility] with complaints of SI with a plan to OD, she was admitted to [facility] on 11/14.  PPHx is significant for Schizoaffective Disorder, Bipolar Type, Borderline Personality Disorder, GAD, PTSD, and ASD, and multiple Suicide Attempts, history of Self Injurious Behavior (cutting), and multiple Psychiatric Hospitalizations (last [facility] 1024).   [name] acted out last night requiring Agitation meds and is currently having SI and thoughts of self harm.  Due to this she was moved back to the 500 Unit.  If she begins scratching herself then we will place her on 1:1 monitoring.  Encouraged her to use positive coping skills.  We will not make any changes to her medications at this time.  This is common behavior for her when approaching discharge she will act out.  We will continue to monitor.    Schizoaffective Disorder,  Bipolar Type: -Continue Zyprexa 15 mg BID for mood stability and psychosis -Continue Cymbalta 90 mg daily for depression and chronic pain -Continue Gabapentin 600 mg TID for anxiety and chronic pain -Continue Prazosin 1 mg QHS for nightmares -Continue Agitation Protocol: Ativan/Benadryl   Asthma: -Continue Dulera 2 puff BID -Continue Albuterol Nebuliation 2.5 mg q6 PRN wheezing   -Continue Propafenone 225 mg BID -Continue Protonix 40 mg daily -Continue Melatonin 3 mg QHS for insomnia -Continue PRN's: Ibuprofen, Maalox, Atarax, Milk of Magnesia, Trazodone  Repeat EKG on 11/20 showed Sinus Tach (108) with Qtc: 487   Lauro Franklin, MD 01/06/2023, 10:48 AM

## 2023-01-06 NOTE — Progress Notes (Signed)
Pt did get up and got her vital signs taken, then Pt came to this staff member and stated "I wish ugly Kathie Rhodes would leave me alone"  Pt states that she visually sees this person as well as hears her constantly in her head.  Pt states Zyprexa works sometimes for this so Zyprexa 8 AM dose of 15 mg given.  Pt states she does want to try to go to breakfast this morning.  Will continue to monitor.

## 2023-01-06 NOTE — Progress Notes (Signed)
D:  Patient's self inventory sheet, patient sleeps good, sleep medication helpful.  Good appetite, low energy level, good concentration.  Rate depression 8, hopeless 10, anxiety 6.  Denied withdrawals.  SI, almost all the time, cannot contract for safety.  Physical problems, all over, pain medicine helpful.  Goal is stay safe.  Plans to listen to staff.  Patient wants to discharge from [facility] and go to [facility].  Does have discharge plans. A:  Medications administered per MD orders.  Emotional support and encouragement given patient. R:  Denied A/V hallucinations this morning.  Does have thoughts to SI by scratching self with fingernails.  Does have HI thoughts to hurt brother and dad.  Safety maintained with 1 minute checks.  Patient was moved from 400 hall to 500 hall after talking to MD.

## 2023-01-06 NOTE — Progress Notes (Signed)
Agitation protocol given due to Pt feeling like banging her head, pulling her hair, picking at her skin.  Pt states "I'm trying not to do that, I used to do it all of the time.  Pt has history of rapidly escalating when she is told that she will be possibly going home.  Pt states that she [facility]  This staff member gave support to Pt and requested that MHT sit outside of her room in hopes that she will fall asleep again.  Pt appears to want to be on 1:1.  Declined to go to quiet room.  Spoke with charge nurse and he states that if she begins to act out she will have to go to quiet room, states that Pt can not be on 1:1 tonight.  Will continue to closely monitor.

## 2023-01-06 NOTE — Plan of Care (Signed)
Nurse discussed anxiety, depression and coping skills with patient.  

## 2023-01-07 DIAGNOSIS — F250 Schizoaffective disorder, bipolar type: Secondary | ICD-10-CM | POA: Diagnosis not present

## 2023-01-07 NOTE — Progress Notes (Signed)
   01/07/23 0815  Psych Admission Type (Psych Patients Only)  Admission Status Voluntary  Psychosocial Assessment  Patient Complaints Anxiety;Restlessness  Eye Contact Fair  Facial Expression Animated  Affect Anxious;Preoccupied  Speech Pressured  Interaction Attention-seeking;Childlike  Motor Activity Fidgety  Appearance/Hygiene Unremarkable  Behavior Characteristics Restless;Fidgety  Mood Preoccupied  Thought Process  Coherency Circumstantial  Content Blaming others  Delusions None reported or observed  Perception Hallucinations  Hallucination Auditory  Judgment Poor  Confusion None  Danger to Self  Current suicidal ideation? Passive  Self-Injurious Behavior No self-injurious ideation or behavior indicators observed or expressed   Agreement Not to Harm Self Yes  Description of Agreement Verbal  Danger to Others  Danger to Others Reported or observed  Danger to Others Abnormal  Harmful Behavior to others Threats of violence towards other people observed or expressed   Destructive Behavior Threats of violence towards property observed or expressed   Description of Harmful Behavior HI towards family  Description of Destructive Behavior Patient trying to pull call bell system from wall.

## 2023-01-07 NOTE — Progress Notes (Signed)
CSW contacted [facility] [phone] to determine status of pt on wait list. [facility] reported no discharges and remains on list.CSW will continue follow and update Treatment Team.

## 2023-01-07 NOTE — Progress Notes (Signed)
   01/07/23 2203  Psych Admission Type (Psych Patients Only)  Admission Status Voluntary  Psychosocial Assessment  Patient Complaints Anger;Agitation;Irritability  Eye Contact Fair  Facial Expression Anxious;Angry  Affect Irritable;Labile;Threatening  Speech Pressured;Rapid  Interaction Hostile  Motor Activity Fidgety  Appearance/Hygiene Unremarkable  Behavior Characteristics Agitated;Agressive verbally;Irritable  Mood Angry;Labile;Irritable  Thought Process  Coherency Circumstantial  Content Blaming others  Delusions None reported or observed  Perception WDL  Hallucination Auditory;Visual  Judgment Poor  Confusion None  Danger to Self  Current suicidal ideation? Denies  Self-Injurious Behavior No self-injurious ideation or behavior indicators observed or expressed   Agreement Not to Harm Self Yes  Description of Agreement verbal  Danger to Others  Danger to Others Reported or observed  Danger to Others Abnormal  Harmful Behavior to others Threats of violence towards other people observed or expressed   Destructive Behavior Threats of violence towards property observed or expressed

## 2023-01-07 NOTE — Plan of Care (Signed)
  Problem: Education: Goal: Emotional status will improve Outcome: Not Progressing Goal: Mental status will improve Outcome: Not Progressing Goal: Verbalization of understanding the information provided will improve Outcome: Not Progressing   

## 2023-01-07 NOTE — Group Note (Signed)
Date:  01/07/2023 Time:  9:49 AM  Group Topic/Focus:  Goals Group:   The focus of this group is to help patients establish daily goals to achieve during treatment and discuss how the patient can incorporate goal setting into their daily lives to aide in recovery. Orientation:   The focus of this group is to educate the patient on the purpose and policies of crisis stabilization and provide a format to answer questions about their admission.  The group details unit policies and expectations of patients while admitted.    Participation Level:  Did Not Attend  Additional Comments:  Patient was encouraged to attend group multiple times.  Quida Glasser T [name] 01/07/2023, 9:49 AM

## 2023-01-07 NOTE — Progress Notes (Signed)
CSW spoke with pt who reported she had a conversation with her paternal uncle Armandina Stammer [phone] about her current living situation. Pt reported uncle offered that pt can stay with him and his wife temporarily. Pt gave consent to CSW to speak with uncle. CSW spoke with uncle who reported the offer is valid however would like to have a conversation with [facility] staff to discuss pt's plan of care. CSW will share information with Treatment Team and will update uncle.

## 2023-01-07 NOTE — Progress Notes (Signed)
EKG complete and on the front of the paper chart.

## 2023-01-07 NOTE — Group Note (Signed)
LCSW Group Therapy Note   Group Date: 01/07/2023 Start Time: 1300 End Time: 1400   Type of Therapy and Topic:  Group Therapy - Who Am I?  Participation Level:  DID NOT ATTEND    Therapeutic Modalities Cognitive Behavioral Therapy Motivational Interviewing  Rogene Houston, [place] 01/07/2023  2:25 PM

## 2023-01-07 NOTE — Plan of Care (Signed)
  Problem: Education: Goal: Knowledge of [place] [facility] Education information/materials will improve Outcome: Progressing   Problem: Activity: Goal: Interest or engagement in activities will improve Outcome: Progressing   Problem: Coping: Goal: Ability to verbalize frustrations and anger appropriately will improve Outcome: Progressing   Problem: Physical Regulation: Goal: Ability to maintain clinical measurements within normal limits will improve Outcome: Progressing

## 2023-01-07 NOTE — Progress Notes (Signed)
Patient pulled call bell system out of the wall in her room. Patient transferred to quiet room for the evening for safety due to exposed wiring. Maintenance notified.

## 2023-01-07 NOTE — Progress Notes (Signed)
[facility] MD Progress Note  01/07/2023 3:17 PM [name]  MRN:  [id number]  HPI: [name] is a 32 yr old female who presented on 11/8 to [facility] with complaints of SI with a plan to OD, she was admitted to [facility] on 11/14.  PPHx is significant for Schizoaffective Disorder, Bipolar Type, Borderline Personality Disorder, GAD, PTSD, and ASD, and multiple Suicide Attempts, history of Self Injurious Behavior (cutting), and multiple Psychiatric Hospitalizations (last [facility] 1024).  Chart review: BP 142/84, HR 101, patient remains compliant with scheduled medications, currently not attending unit group sessions, noted to be anxious, restless, fidgety, presented with passive SI earlier today during nursing assessment, also verbalized threats to other people during the same assessment.  No behavioral episodes noted in the past 24 hours.  Slept for 24 hrs last night as per nursing documentation.  Patient assessment: During encounter today, patient remained depressed, affect was congruent and flat.  She continues to verbalize wanting to go to [facility] for long-term treatment of her depressive symptoms and psychosis. Pt continues to report +SI, denies having a plan, verbally contracts for safety on the unit. Will not contract for safety outside of the hospital environment.   Pt is continuing to exhibit attention seeking behaviors, repeatedly asking for staff to transfer her back to the 300 hall, and educated that she will be staying on the 500 hall for the rest of this hospitalization. She reports VH of "ugly Kathie Rhodes", unable to state who this is, denies AH, denies first rank symptoms, denies delusional thinking. Reports HI towards her parents & brother, denies intent or plan to do so. Reports that she has been in touch with her uncle who states that pt can come reside with him for sometime. As per CSW, said uncle is asking for a family meeting with pt's treatment team. We will revisit this during staff  hurdle tomorrow morning.  There are no medication related concerns today, and pt denies any medication related side effects. She denies being in any physical pain today. No TD/EPS type symptoms found on assessment, and pt denies any feelings of stiffness. AIMS: 0. We will continue all meds as listed below with no changes today.   Principal Problem: Schizoaffective disorder, bipolar type (HCC) Diagnosis: Principal Problem:   Schizoaffective disorder, bipolar type (HCC) Active Problems:   Borderline personality disorder (HCC)   Asperger syndrome   PTSD (post-traumatic stress disorder)   Asthma   Self-injurious behavior   Fibromyalgia   Prolonged QT interval  Total Time spent with patient:  I personally spent 45 minutes on the unit in direct patient care. The direct patient care time included face-to-face time with the patient, reviewing the patient's chart, communicating with other professionals, and coordinating care. Greater than 50% of this time was spent in counseling or coordinating care with the patient regarding goals of hospitalization, psycho-education, and discharge planning needs.   Past Psychiatric History: Schizoaffective Disorder, Bipolar Type, Borderline Personality Disorder, GAD, PTSD, and ASD, and multiple Suicide Attempts, history of Self Injurious Behavior (cutting), and multiple Psychiatric Hospitalizations (last [facility] 1024).  Past Medical History:  Past Medical History:  Diagnosis Date   Acid reflux    Adjustment disorder with mixed anxiety and depressed mood 01/31/2022   Adjustment disorder with mixed disturbance of emotions and conduct 08/03/2019   Anxiety    Asthma    last attack 03/13/15 or 03/14/15   Autism    Bipolar 1 disorder, depressed, severe (HCC) 07/25/2021   Carrier  of fragile X syndrome    Chronic constipation    Depression    Drug-seeking behavior    Essential tremor    Headache    Ineffective individual coping 05/16/2022   Insomnia  01/12/2022   Intentional drug overdose (HCC) 06/05/2022   Neuromuscular disorder (HCC)    Normocytic anemia 06/05/2022   Overdose 07/22/2017   Overdose of acetaminophen 07/2017   and other meds   Overdose, intentional self-harm, initial encounter (HCC) 07/20/2021   Paranoia (HCC) 04/22/2021   Personality disorder (HCC)    Prolonged QT interval    Purposeful non-suicidal drug ingestion (HCC) 06/27/2021   Schizo-affective psychosis (HCC)    Schizoaffective disorder (HCC) 07/29/2022   Schizoaffective disorder, bipolar type (HCC)    Seizures (HCC)    Last seizure December 2017   Skin erythema 04/27/2022   Sleep apnea    Suicidal behavior 07/25/2021   Suicidal ideation    Suicide (HCC) 07/01/2021   Suicide attempt (HCC) 07/04/2021    Past Surgical History:  Procedure Laterality Date   MOUTH SURGERY  2009 or 2010   Family History:  Family History  Problem Relation Age of Onset   Mental illness Father    Asthma Father    PDD Brother    Seizures Brother    Family Psychiatric  History: Mental illness in her father; PDD in her brother; Seizures in her brother.  Social History:  Social History   Substance and Sexual Activity  Alcohol Use No   Alcohol/week: 1.0 standard drink of alcohol   Types: 1 Standard drinks or equivalent per week   Comment: denies at this time     Social History   Substance and Sexual Activity  Drug Use No   Comment: History of cocaine use at age 41 for 4 months    Social History   Socioeconomic History   Marital status: Widowed    Spouse name: Not on file   Number of children: 0   Years of education: Not on file   Highest education level: Not on file  Occupational History   Occupation: disability  Tobacco Use   Smoking status: Former    Types: Cigarettes   Smokeless tobacco: Never   Tobacco comments:    Smoked for 2  years age [age]  Vaping Use   Vaping status: Never Used  Substance and Sexual Activity   Alcohol use: No     Alcohol/week: 1.0 standard drink of alcohol    Types: 1 Standard drinks or equivalent per week    Comment: denies at this time   Drug use: No    Comment: History of cocaine use at age 20 for 4 months   Sexual activity: Not Currently    Birth control/protection: None  Other Topics Concern   Not on file  Social History Narrative   Marital status: Widowed      Children: daughter      Lives: with boyfriend, in two story home      Employment:  Disability      Tobacco: quit smoking; smoked for two years.      Alcohol ;none      Drugs: none   Has not traveled outside of the country.   Right handed         Patient with hx of Fibromyalgia,Orthostatic Tachycardia,Asthma,Arthritis,Gerd, ASD   Social Determinants of Health   Financial Resource Strain: Not on file  Food Insecurity: No Food Insecurity (12/25/2022)   Hunger Vital Sign    Worried  About Running Out of Food in the Last Year: Never true    Ran Out of Food in the Last Year: Never true  Transportation Needs: No Transportation Needs (12/25/2022)   PRAPARE - Administrator, Civil Service (Medical): No    Lack of Transportation (Non-Medical): No  Physical Activity: Not on file  Stress: Not on file  Social Connections: Not on file    Sleep: Good  Appetite:  Good  Current Medications: Current Facility-Administered Medications  Medication Dose Route Frequency Provider Last Rate Last Admin   albuterol (PROVENTIL) (2.5 MG/3ML) 0.083% nebulizer solution 2.5 mg  2.5 mg Nebulization Q6H PRN Weber, Kyra A, NP       alum & mag hydroxide-simeth (MAALOX/MYLANTA) 200-200-20 MG/5ML suspension 30 mL  30 mL Oral Q4H PRN Weber, Kyra A, NP       bacitracin ointment   Topical BID Starleen Blue, NP   Given at 01/07/23 0749   diclofenac Sodium (VOLTAREN) 1 % topical gel 4 g  4 g Topical QID PRN Jearld Lesch, NP   4 g at 01/06/23 1810   diphenhydrAMINE (BENADRYL) capsule 50 mg  50 mg Oral TID PRN Phebe Colla A, NP   50 mg at  01/06/23 0408   Or   diphenhydrAMINE (BENADRYL) injection 50 mg  50 mg Intramuscular TID PRN Weber, Kyra A, NP   50 mg at 12/27/22 0912   DULoxetine (CYMBALTA) DR capsule 90 mg  90 mg Oral Daily Rex Kras, MD   90 mg at 01/07/23 0749   gabapentin (NEURONTIN) capsule 600 mg  600 mg Oral TID Rex Kras, MD   600 mg at 01/07/23 1336   hydrOXYzine (ATARAX) tablet 25 mg  25 mg Oral TID PRN Starleen Blue, NP   25 mg at 01/07/23 1007   ibuprofen (ADVIL) tablet 600 mg  600 mg Oral Q8H PRN Weber, Kyra A, NP   600 mg at 01/07/23 1246   LORazepam (ATIVAN) tablet 2 mg  2 mg Oral TID PRN Phebe Colla A, NP   2 mg at 01/06/23 0408   Or   LORazepam (ATIVAN) injection 2 mg  2 mg Intramuscular TID PRN Weber, Bella Kennedy A, NP   2 mg at 12/27/22 0912   magnesium hydroxide (MILK OF MAGNESIA) suspension 30 mL  30 mL Oral Daily PRN Weber, Kyra A, NP   30 mL at 01/05/23 0252   melatonin tablet 3 mg  3 mg Oral QHS Murice Barbar, NP   3 mg at 01/06/23 2025   mometasone-formoterol (DULERA) 200-5 MCG/ACT inhaler 2 puff  2 puff Inhalation BID Ajibola, Ene A, NP   2 puff at 01/07/23 0749   OLANZapine (ZYPREXA) tablet 15 mg  15 mg Oral BID Lauro Franklin, MD   15 mg at 01/07/23 0748   ondansetron (ZOFRAN-ODT) disintegrating tablet 4 mg  4 mg Oral Q8H PRN Weber, Kyra A, NP   4 mg at 01/07/23 0746   pantoprazole (PROTONIX) EC tablet 40 mg  40 mg Oral QAC breakfast Weber, Kyra A, NP   40 mg at 01/07/23 0636   prazosin (MINIPRESS) capsule 1 mg  1 mg Oral QHS Weber, Kyra A, NP   1 mg at 01/06/23 2026   propafenone (RYTHMOL SR) 12 hr capsule 225 mg  225 mg Oral BID Lauro Franklin, MD   225 mg at 01/07/23 1610   Lab Results: No results found for this or any previous visit (from the past 48 hour(s)).  Blood  Alcohol level:  Lab Results  Component Value Date   ETH <10 12/18/2022   ETH <10 12/14/2022    Metabolic Disorder Labs: Lab Results  Component Value Date   HGBA1C 5.4 10/22/2022   MPG 108  10/22/2022   MPG 103 04/20/2022   Lab Results  Component Value Date   PROLACTIN 20.0 12/14/2022   PROLACTIN 66.2 (H) 03/02/2021   Lab Results  Component Value Date   CHOL 188 12/14/2022   TRIG 222 (H) 12/14/2022   HDL 41 12/14/2022   CHOLHDL 4.6 12/14/2022   VLDL 44 (H) 12/14/2022   LDLCALC 103 (H) 12/14/2022   LDLCALC 117 (H) 10/22/2022   Physical Findings: AIMS:0 CIWA:n/a COWS: n/a  Musculoskeletal: Strength & Muscle Tone: within normal limits Gait & Station: normal Patient leans: N/A  Psychiatric Specialty Exam:  Presentation  General Appearance:  Appropriate for Environment; Fairly Groomed  Eye Contact: Fair  Speech: Clear and Coherent  Speech Volume: Normal  Handedness: Right   Mood and Affect  Mood: Depressed; Anxious  Affect: Congruent   Thought Process  Thought Processes: Coherent  Descriptions of Associations:Intact  Orientation:Full (Time, Place and Person)  Thought Content:Logical  History of Schizophrenia/Schizoaffective disorder:Yes  Duration of Psychotic Symptoms:Greater than six months  Hallucinations:Hallucinations: Visual Description of Auditory Hallucinations: voices telling her she is worthless  Ideas of Reference:None  Suicidal Thoughts:Suicidal Thoughts: No SI Active Intent and/or Plan: Without Intent; Without Plan SI Passive Intent and/or Plan: Without Intent; Without Plan  Homicidal Thoughts:Homicidal Thoughts: Yes, Active HI Active Intent and/or Plan: Without Intent; Without Plan   Sensorium  Memory: Recent Poor  Judgment: Poor  Insight: Poor   Executive Functions  Concentration: Poor  Attention Span: Fair  Recall: [employer] of Knowledge: Fair  Language: Fair   Psychomotor Activity  Psychomotor Activity: Psychomotor Activity: Normal   Assets  Assets: Resilience; Social Support   Sleep  Sleep: Sleep: Good Number of Hours of Sleep: 8.5    Physical Exam: Physical  Exam Vitals and nursing note reviewed.  Constitutional:      Appearance: Normal appearance.  Neurological:     Mental Status: She is alert and oriented to person, place, and time.  Psychiatric:        Thought Content: Thought content normal.    Review of Systems  Respiratory:  Negative for cough and shortness of breath.   Cardiovascular:  Negative for chest pain.  Gastrointestinal:  Negative for abdominal pain, constipation, diarrhea, nausea and vomiting.  Neurological:  Negative for dizziness, weakness and headaches.  Psychiatric/Behavioral:  Positive for depression, hallucinations (AH) and suicidal ideas. Negative for memory loss and substance abuse. The patient is nervous/anxious and has insomnia.   All other systems reviewed and are negative.  Blood pressure (!) 142/84, pulse (!) 101, temperature 97.9 F (36.6 C), temperature source Oral, resp. rate 16, height 5\' 4"  (1.626 m), weight 112.5 kg, SpO2 100%. Body mass index is 42.57 kg/m.   Treatment Plan Summary: Daily contact with patient to assess and evaluate symptoms and progress in treatment and Medication management  [name] is a 32 yr old female who presented on 11/8 to [facility] with complaints of SI with a plan to OD, she was admitted to [facility] on 11/14.  PPHx is significant for Schizoaffective Disorder, Bipolar Type, Borderline Personality Disorder, GAD, PTSD, and ASD, and multiple Suicide Attempts, history of Self Injurious Behavior (cutting), and multiple Psychiatric Hospitalizations (last [facility] 1024).   [name] acted out last night requiring Agitation  meds and is currently having SI and thoughts of self harm.  Due to this she was moved back to the 500 Unit.  If she begins scratching herself then we will place her on 1:1 monitoring.  Encouraged her to use positive coping skills.  We will not make any changes to her medications at this time.  This is common behavior for her when approaching discharge she will act  out.  We will continue to monitor.    Schizoaffective Disorder, Bipolar Type: -Continue Zyprexa 15 mg BID for mood stability and psychosis -Continue Cymbalta 90 mg daily for depression and chronic pain -Continue Gabapentin 600 mg TID for anxiety and chronic pain -Continue Prazosin 1 mg QHS for nightmares -Continue Agitation Protocol: Ativan/Benadryl   Asthma: -Continue Dulera 2 puff BID -Continue Albuterol Nebuliation 2.5 mg q6 PRN wheezing   -Continue Propafenone 225 mg BID -Continue Protonix 40 mg daily -Continue Melatonin 3 mg QHS for insomnia -Continue PRN's: Ibuprofen, Maalox, Atarax, Milk of Magnesia, Trazodone  Repeat EKG on 11/20 showed Sinus Tach (108) with Qtc: 487, Repeat EKG ordered today, 12/2.   Starleen Blue, NP 01/07/2023, 3:17 PM Patient ID: [name], female   DOB: July 14, 1990, 32 y.o.   MRN: [id number]

## 2023-01-07 NOTE — Progress Notes (Signed)
Pt has been labile, loud and threatening to kill all staff and then go jail. Pt's not following redirection and progressively getting out of control. Pt shouting at the top of her her "I will kill you all, cut your heads off mother fuckers". Pt medicated as per agitation protocol, will continue to monitor.

## 2023-01-07 NOTE — Group Note (Signed)
Recreation Therapy Group Note   Group Topic:Healthy Decision Making  Group Date: 01/07/2023 Start Time: 1039 End Time: 1105 Facilitators: Majorie Santee-McCall, LRT,CTRS Location: 500 Hall Dayroom   Group Topic: Decision Making, Problem Solving, Communication  Goal Area(s) Addresses:  Patient will effectively work with peer towards shared goal.  Patient will identify factors that guided their decision making.  Patient will pro-socially communicate ideas during group session.   Intervention: Survival Scenario - pencil, paper  Group Description: Patients were given a scenario that they were going to be stranded on a deserted [place] for several months before being rescued. Writer tasked them with making a list of 15 things they would choose to bring with them for "survival". The list of items was prioritized most important to least. Each patient would come up with their own list, then work together to create a new list of 15 items while in a group of 3-5 peers. LRT discussed each person's list and how it differed from others. The debrief included discussion of priorities, good decisions versus bad decisions, and how it is important to think before acting so we can make the best decision possible. LRT tied the concept of effective communication among group members to patient's support systems outside of the hospital and its benefit post discharge.  Education: Pharmacist, community, Priorities, Support System, Discharge Planning   Education Outcome: Acknowledges education/In group clarification/Needs additional education   Affect/Mood: N/A   Participation Level: Did not attend    Clinical Observations/Individualized Feedback:      Plan: Continue to engage patient in RT group sessions 2-3x/week.   Adore Kithcart-McCall, LRT,CTRS  01/07/2023 1:28 PM

## 2023-01-08 DIAGNOSIS — F250 Schizoaffective disorder, bipolar type: Secondary | ICD-10-CM | POA: Diagnosis not present

## 2023-01-08 NOTE — Group Note (Signed)
Recreation Therapy Group Note   Group Topic:Health and Wellness  Group Date: 01/08/2023 Start Time: 1045 End Time: 1110 Facilitators: Eupha Lobb-McCall, LRT,CTRS Location: 500 Hall Dayroom   Group Topic: Wellness  Goal Area(s) Addresses:  Patient will define components of whole wellness. Patient will verbalize benefit of whole wellness.  Intervention: Music, Calm App  Group Description: LRT and patients discussed the importance of wellness and how it affects the body. Patients were to lead the group in the exercises of their choosing. The exercises could be as strenuous or easy as the patient chooses. LRT then led the group in a meditation that focused on being resilient in face of adversity.    Education: Wellness, Building control surveyor.   Education Outcome: Acknowledges education/In group clarification offered/Needs additional education.    Affect/Mood: Flat   Participation Level: Minimal   Participation Quality: Independent   Behavior: Disinterested   Speech/Thought Process: Barely audible    Insight: Poor   Judgement: Poor   Modes of Intervention: Music and Meditation   Patient Response to Interventions:  Disengaged   Education Outcome:  In group clarification offered    Clinical Observations/Individualized Feedback: Pt appeared depressed and refused to engage in the physical aspect of group even with encouragement from LRT. Pt was fidgeting during meditation and mumbling to herself at times. When asked how she felt after the meditation, pt stated she felt angry. Pt went on to say she was done with family/everybody and wanted to lay down and get ran over.     Plan: Continue to engage patient in RT group sessions 2-3x/week.   Lizbett Garciagarcia-McCall, LRT,CTRS 01/08/2023 1:25 PM

## 2023-01-08 NOTE — Progress Notes (Signed)
CSW spoke with pt to determine if she is still considering moving in with uncle temporaily, pt reported she would like to move in with her uncle and wife.   CSW spoke with pt's uncle [name] [phone] who reported pt can live with him. Pt's uncle requested Family Meeting which has been scheduled for 01/09/23 at 1:00 pm via Web Ex. Pt and Treatment Team made aware.    Pt also reported that she has an agency that manages her money and pays her rent. Pt reported she will be able to return to her apt after leaving uncle's home. Pt also has ACTT services with [employer] who will continue to provide support.   CSW will continue to follow and update Treatment Team.

## 2023-01-08 NOTE — Plan of Care (Signed)
  Problem: Education: Goal: Knowledge of [place] [facility] Education information/materials will improve Outcome: Progressing   Problem: Activity: Goal: Interest or engagement in activities will improve Outcome: Progressing   Problem: Safety: Goal: Periods of time without injury will increase Outcome: Progressing   

## 2023-01-08 NOTE — Progress Notes (Signed)
Pt currently sitting in quiet room.  Door open.  She is unrestrained. Pt has been yelling " I am going to kill all you mother fuckers".  I am going to kill everyone of you".  Pt took prn medication for agitation without incident.

## 2023-01-08 NOTE — Plan of Care (Signed)
°  Problem: Education: °Goal: Emotional status will improve °Outcome: Progressing °Goal: Mental status will improve °Outcome: Progressing °Goal: Verbalization of understanding the information provided will improve °Outcome: Progressing °  °

## 2023-01-08 NOTE — [facility] Group Notes (Signed)
Adult Psychoeducational Group Note  Date:  01/08/2023 Time:  9:12 PM  Group Topic/Focus:  Wrap-Up Group:   The focus of this group is to help patients review their daily goal of treatment and discuss progress on daily workbooks.  Participation Level:  Active  Participation Quality:  Appropriate  Affect:  Appropriate  Cognitive:  Appropriate  Insight: Appropriate  Engagement in Group:  Engaged  Modes of Intervention:  Discussion  Additional Comments:    [name] 01/08/2023, 9:12 PM

## 2023-01-08 NOTE — [facility] Group Notes (Signed)
[facility] Group Notes:  (Nursing/MHT/Case Management/Adjunct)  Adult Psychoeducational Group Note  Date:  01/08/2023 Time:  4:11 PM  Group Topic/Focus:  Goals Group:   The focus of this group is to help patients establish daily goals to achieve during treatment and discuss how the patient can incorporate goal setting into their daily lives to aide in recovery.  Participation Level:  Active  Participation Quality:  Appropriate  Affect:  Appropriate  Cognitive:  Appropriate  Insight: Appropriate  Engagement in Group:  Engaged  Modes of Intervention:  Discussion  Additional Comments:  Pt actively participated in the group.    [name] 01/08/2023, 4:10 PM

## 2023-01-08 NOTE — Progress Notes (Signed)
   01/08/23 1014  Psych Admission Type (Psych Patients Only)  Admission Status Voluntary  Psychosocial Assessment  Patient Complaints Agitation;Irritability  Eye Contact Fair  Facial Expression Animated  Affect Anxious;Preoccupied;Threatening  Speech Pressured;Aggressive  Interaction Attention-seeking;Childlike  Motor Activity Fidgety  Appearance/Hygiene Unremarkable  Behavior Characteristics Agressive verbally;Agitated  Mood Labile  Thought Process  Coherency Circumstantial  Content Blaming others  Delusions None reported or observed  Perception Hallucinations  Hallucination Auditory  Judgment Poor  Confusion None  Danger to Self  Current suicidal ideation? Passive  Self-Injurious Behavior No self-injurious ideation or behavior indicators observed or expressed   Agreement Not to Harm Self Yes  Description of Agreement Verbal  Danger to Others  Danger to Others Reported or observed  Danger to Others Abnormal  Harmful Behavior to others Threats of violence towards other people observed or expressed   Destructive Behavior Threats of violence towards property observed or expressed

## 2023-01-08 NOTE — Progress Notes (Signed)
Pt returned from lunch and was overheard stating " I am going to kill everyone".  Pt reported that she was unable to state what was making her agitated "I am just pissed off at all these mother fuckers".   Pt was given prn medication for agitation as ordered.

## 2023-01-08 NOTE — Progress Notes (Signed)
[facility] MD Progress Note  01/08/2023 5:23 PM [name]  MRN:  [id number]  HPI: [name] is a 32 yr old female who presented on 11/8 to [facility] with complaints of SI with a plan to OD, she was admitted to [facility] on 11/14.  PPHx is significant for Schizoaffective Disorder, Bipolar Type, Borderline Personality Disorder, GAD, PTSD, and ASD, and multiple Suicide Attempts, history of Self Injurious Behavior (cutting), and multiple Psychiatric Hospitalizations (last [facility] 1024).  Chart review: HR has had sustained elevations since admission. Pt is on Rythmol SR 225 mg prescribed prior to hospitalization. We will consult the medicine team tomorrow if HR continues to be elevated. No behavioral episodes noted in the past 24 hours.  Slept for 24 hrs last night as per nursing documentation.  Patient assessment: During encounter today, patient remained depressed, affect was congruent and flat. Pt denies SI during encounter, denies HI, but reports AH even though she does not seem to be responding to any type of stimuli. She denies VH, states that she is hearing her parents' voices stating: "We're going to get you." She reports a fair sleep quality last night, denies being in any physical pain today, continues to state that she would like to go to [facility]. She has also mentioned wanting to go reside with her aunt and uncle. CSW called uncle yesterday, and there are plans for pt's treatment team to meet with him virtually tomorrow as pt has stated that uncle is agreeing for her to go reside with him at least for some time.  There are no medication related concerns today, and pt denies any medication related side effects. No TD/EPS type symptoms found on assessment, and pt denies any feelings of stiffness. AIMS: 0. We will continue all meds as listed below with no changes today.   Principal Problem: Schizoaffective disorder, bipolar type (HCC) Diagnosis: Principal Problem:   Schizoaffective disorder,  bipolar type (HCC) Active Problems:   Borderline personality disorder (HCC)   Asperger syndrome   PTSD (post-traumatic stress disorder)   Asthma   Self-injurious behavior   Fibromyalgia   Prolonged QT interval  Total Time spent with patient:  I personally spent 45 minutes on the unit in direct patient care. The direct patient care time included face-to-face time with the patient, reviewing the patient's chart, communicating with other professionals, and coordinating care. Greater than 50% of this time was spent in counseling or coordinating care with the patient regarding goals of hospitalization, psycho-education, and discharge planning needs.   Past Psychiatric History: Schizoaffective Disorder, Bipolar Type, Borderline Personality Disorder, GAD, PTSD, and ASD, and multiple Suicide Attempts, history of Self Injurious Behavior (cutting), and multiple Psychiatric Hospitalizations (last [facility] 1024).  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    Chronic constipation    Depression    Drug-seeking behavior    Essential tremor    Headache    Ineffective individual coping 05/16/2022   Insomnia 01/12/2022   Intentional drug overdose (HCC) 06/05/2022   Neuromuscular disorder (HCC)    Normocytic anemia 06/05/2022   Overdose 07/22/2017   Overdose of acetaminophen  07/2017   and other meds   Overdose, intentional self-harm, initial encounter (HCC) 07/20/2021   Paranoia (HCC) 04/22/2021   Personality disorder (HCC)    Prolonged QT interval    Purposeful non-suicidal drug ingestion (HCC) 06/27/2021   Schizo-affective psychosis (HCC)     Schizoaffective disorder (HCC) 07/29/2022   Schizoaffective disorder, bipolar type (HCC)    Seizures (HCC)    Last seizure December 2017   Skin erythema 04/27/2022   Sleep apnea    Suicidal behavior 07/25/2021   Suicidal ideation    Suicide (HCC) 07/01/2021   Suicide attempt (HCC) 07/04/2021    Past Surgical History:  Procedure Laterality Date   MOUTH SURGERY  2009 or 2010   Family History:  Family History  Problem Relation Age of Onset   Mental illness Father    Asthma Father    PDD Brother    Seizures Brother    Family Psychiatric  History: Mental illness in her father; PDD in her brother; Seizures in her brother.  Social History:  Social History   Substance and Sexual Activity  Alcohol Use No   Alcohol/week: 1.0 standard drink of alcohol   Types: 1 Standard drinks or equivalent per week   Comment: denies at this time     Social History   Substance and Sexual Activity  Drug Use No   Comment: History of cocaine use at age 69 for 4 months    Social History   Socioeconomic History   Marital status: Widowed    Spouse name: Not on file   Number of children: 0   Years of education: Not on file   Highest education level: Not on file  Occupational History   Occupation: disability  Tobacco Use   Smoking status: Former    Types: Cigarettes   Smokeless tobacco: Never   Tobacco comments:    Smoked for 2  years age [age]  Vaping Use   Vaping status: Never Used  Substance and Sexual Activity   Alcohol use: No    Alcohol/week: 1.0 standard drink of alcohol    Types: 1 Standard drinks or equivalent per week    Comment: denies at this time   Drug use: No    Comment: History of cocaine use at age 12 for 4 months   Sexual activity: Not Currently    Birth control/protection: None  Other Topics Concern   Not on file  Social History Narrative   Marital status: Widowed      Children: daughter      Lives: with boyfriend, in two story home      Employment:  Disability       Tobacco: quit smoking; smoked for two years.      Alcohol ;none      Drugs: none   Has not traveled outside of the country.   Right handed         Patient with hx of Fibromyalgia,Orthostatic Tachycardia,Asthma,Arthritis,Gerd, ASD   Social Determinants of Health   Financial Resource Strain: Not on file  Food Insecurity: No Food Insecurity (12/25/2022)   Hunger Vital Sign    Worried About Running Out of Food in the Last Year: Never true    Ran Out of Food in the Last Year: Never true  Transportation Needs: No Transportation Needs (12/25/2022)   PRAPARE - Administrator, Civil Service (Medical): No    Lack of Transportation (Non-Medical): No  Physical Activity: Not on file  Stress: Not on file  Social  Connections: Not on file    Sleep: Good  Appetite:  Good  Current Medications: Current Facility-Administered Medications  Medication Dose Route Frequency Provider Last Rate Last Admin   albuterol (PROVENTIL) (2.5 MG/3ML) 0.083% nebulizer solution 2.5 mg  2.5 mg Nebulization Q6H PRN Weber, Kyra A, NP       alum & mag hydroxide-simeth (MAALOX/MYLANTA) 200-200-20 MG/5ML suspension 30 mL  30 mL Oral Q4H PRN Weber, Bella Kennedy A, NP       bacitracin ointment   Topical BID Starleen Blue, NP   [phone] Application at 01/08/23 0731   diclofenac Sodium (VOLTAREN) 1 % topical gel 4 g  4 g Topical QID PRN Jearld Lesch, NP   4 g at 01/08/23 0732   diphenhydrAMINE (BENADRYL) capsule 50 mg  50 mg Oral TID PRN Phebe Colla A, NP   50 mg at 01/08/23 1258   Or   diphenhydrAMINE (BENADRYL) injection 50 mg  50 mg Intramuscular TID PRN Weber, Bella Kennedy A, NP   50 mg at 01/07/23 2057   DULoxetine (CYMBALTA) DR capsule 90 mg  90 mg Oral Daily Rex Kras, MD   90 mg at 01/08/23 0732   gabapentin (NEURONTIN) capsule 600 mg  600 mg Oral TID Rex Kras, MD   600 mg at 01/08/23 1258   hydrOXYzine (ATARAX) tablet 25 mg  25 mg Oral TID PRN Starleen Blue, NP   25 mg at 01/07/23 2020    ibuprofen (ADVIL) tablet 600 mg  600 mg Oral Q8H PRN Weber, Kyra A, NP   600 mg at 01/08/23 1158   LORazepam (ATIVAN) tablet 2 mg  2 mg Oral TID PRN Phebe Colla A, NP   2 mg at 01/08/23 1259   Or   LORazepam (ATIVAN) injection 2 mg  2 mg Intramuscular TID PRN Weber, Bella Kennedy A, NP   2 mg at 01/07/23 2057   magnesium hydroxide (MILK OF MAGNESIA) suspension 30 mL  30 mL Oral Daily PRN Weber, Kyra A, NP   30 mL at 01/05/23 0252   melatonin tablet 3 mg  3 mg Oral QHS Luwanda Starr, NP   3 mg at 01/07/23 2020   mometasone-formoterol (DULERA) 200-5 MCG/ACT inhaler 2 puff  2 puff Inhalation BID Ajibola, Ene A, NP   2 puff at 01/08/23 1646   OLANZapine (ZYPREXA) tablet 15 mg  15 mg Oral BID Lauro Franklin, MD   15 mg at 01/08/23 0735   ondansetron (ZOFRAN-ODT) disintegrating tablet 4 mg  4 mg Oral Q8H PRN Weber, Kyra A, NP   4 mg at 01/07/23 0746   pantoprazole (PROTONIX) EC tablet 40 mg  40 mg Oral QAC breakfast Weber, Kyra A, NP   40 mg at 01/08/23 0614   prazosin (MINIPRESS) capsule 1 mg  1 mg Oral QHS Weber, Kyra A, NP   1 mg at 01/07/23 2020   propafenone (RYTHMOL SR) 12 hr capsule 225 mg  225 mg Oral BID Lauro Franklin, MD   225 mg at 01/08/23 1601   Lab Results: No results found for this or any previous visit (from the past 48 hour(s)).  Blood Alcohol level:  Lab Results  Component Value Date   [facility] <10 12/18/2022   ETH <10 12/14/2022    Metabolic Disorder Labs: Lab Results  Component Value Date   HGBA1C 5.4 10/22/2022   MPG 108 10/22/2022   MPG 103 04/20/2022   Lab Results  Component Value Date   PROLACTIN 20.0 12/14/2022   PROLACTIN 66.2 (H)  03/02/2021   Lab Results  Component Value Date   CHOL 188 12/14/2022   TRIG 222 (H) 12/14/2022   HDL 41 12/14/2022   CHOLHDL 4.6 12/14/2022   VLDL 44 (H) 12/14/2022   LDLCALC 103 (H) 12/14/2022   LDLCALC 117 (H) 10/22/2022   Physical Findings: AIMS:0 CIWA:n/a COWS: n/a  Musculoskeletal: Strength & Muscle Tone: within  normal limits Gait & Station: normal Patient leans: N/A  Psychiatric Specialty Exam:  Presentation  General Appearance:  Fairly Groomed  Eye Contact: Fair  Speech: Clear and Coherent  Speech Volume: Normal  Handedness: Right   Mood and Affect  Mood: Depressed; Anxious  Affect: Congruent   Thought Process  Thought Processes: Coherent  Descriptions of Associations:Intact  Orientation:Full (Time, Place and Person)  Thought Content:Logical  History of Schizophrenia/Schizoaffective disorder:Yes  Duration of Psychotic Symptoms:Greater than six months  Hallucinations:Hallucinations: Auditory  Ideas of Reference:Paranoia; Delusions; Percusatory  Suicidal Thoughts:Suicidal Thoughts: No SI Active Intent and/or Plan: Without Intent; Without Plan SI Passive Intent and/or Plan: Without Intent; Without Plan  Homicidal Thoughts:Homicidal Thoughts: No HI Active Intent and/or Plan: Without Intent; Without Plan   Sensorium  Memory: Recent Fair  Judgment: Poor  Insight: Poor   Executive Functions  Concentration: Fair  Attention Span: Fair  Recall: [employer] of Knowledge: Fair  Language: Fair   Psychomotor Activity  Psychomotor Activity: Psychomotor Activity: Normal   Assets  Assets: Resilience   Sleep  Sleep: Sleep: Good    Physical Exam: Physical Exam Vitals and nursing note reviewed.  Constitutional:      Appearance: Normal appearance.  Neurological:     Mental Status: She is alert and oriented to person, place, and time.  Psychiatric:        Thought Content: Thought content normal.    Review of Systems  Respiratory:  Negative for cough and shortness of breath.   Cardiovascular:  Negative for chest pain.  Gastrointestinal:  Negative for abdominal pain, constipation, diarrhea, nausea and vomiting.  Neurological:  Negative for dizziness, weakness and headaches.  Psychiatric/Behavioral:  Positive for depression and  hallucinations (AH). Negative for memory loss, substance abuse and suicidal ideas. The patient is nervous/anxious. The patient does not have insomnia.   All other systems reviewed and are negative.  Blood pressure 133/74, pulse (!) 120, temperature (!) 97.4 F (36.3 C), temperature source Oral, resp. rate 16, height 5\' 4"  (1.626 m), weight 112.5 kg, SpO2 100%. Body mass index is 42.57 kg/m.   Treatment Plan Summary: Daily contact with patient to assess and evaluate symptoms and progress in treatment and Medication management  Schizoaffective Disorder, Bipolar Type: -Continue Zyprexa 15 mg BID for mood stability and psychosis -Continue Cymbalta 90 mg daily for depression and chronic pain -Continue Gabapentin 600 mg TID for anxiety and chronic pain -Continue Prazosin 1 mg QHS for nightmares -Continue Agitation Protocol: Ativan/Benadryl  Asthma: -Continue Dulera 2 puff BID -Continue Albuterol Nebuliation 2.5 mg q6 PRN wheezing  -Continue Propafenone 225 mg BID -Continue Protonix 40 mg daily -Continue Melatonin 3 mg QHS for insomnia -Continue PRN's: Ibuprofen, Maalox, Atarax, Milk of Magnesia, Trazodone  Repeat EKG on 11/20 showed Sinus Tach (108) with Qtc: 487, Repeat EKG ordered today, 12/2.   Starleen Blue, NP 01/08/2023, 5:23 PM Patient ID: [name], female   DOB: [birth year], 32 y.o.   MRN: [id number] Patient ID: [name], female   DOB: [birth year], 32 y.o.   MRN: [id number]
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
## 2023-01-09 DIAGNOSIS — F250 Schizoaffective disorder, bipolar type: Secondary | ICD-10-CM | POA: Diagnosis not present

## 2023-01-09 NOTE — Group Note (Signed)
Date:  01/09/2023 Time:  9:03 AM  Group Topic/Focus:  Goals Group:   The focus of this group is to help patients establish daily goals to achieve during treatment and discuss how the patient can incorporate goal setting into their daily lives to aide in recovery.    Participation Level:  Active  Participation Quality:  Appropriate  Affect:  Appropriate  Cognitive:  Appropriate  Insight: Appropriate  Engagement in Group:  Engaged  Modes of Intervention:  Discussion  Additional Comments:  PT stated that her goal is to"work on staying safe and not hurting anyone." She says she will use stress ball and take showers as a coping skill.   Donell Beers 01/09/2023, 9:03 AM

## 2023-01-09 NOTE — Plan of Care (Signed)
  Problem: Education: Goal: Knowledge of [facility] [facility] Education information/materials will improve Outcome: Progressing Goal: Emotional status will improve Outcome: Progressing Goal: Mental status will improve Outcome: Progressing Goal: Verbalization of understanding the information provided will improve Outcome: Progressing   Problem: Activity: Goal: Interest or engagement in activities will improve Outcome: Progressing Goal: Sleeping patterns will improve Outcome: Progressing   Problem: Coping: Goal: Ability to verbalize frustrations and anger appropriately will improve Outcome: Progressing Goal: Ability to demonstrate self-control will improve Outcome: Progressing   Problem: Health Behavior/Discharge Planning: Goal: Identification of resources available to assist in meeting health care needs will improve Outcome: Progressing Goal: Compliance with treatment plan for underlying cause of condition will improve Outcome: Progressing   Problem: Physical Regulation: Goal: Ability to maintain clinical measurements within normal limits will improve Outcome: Progressing   Problem: Safety: Goal: Periods of time without injury will increase Outcome: Progressing   Problem: Education: Goal: Ability to incorporate positive changes in behavior to improve self-esteem will improve Outcome: Progressing   Problem: Health Behavior/Discharge Planning: Goal: Ability to identify and utilize available resources and services will improve Outcome: Progressing Goal: Ability to remain free from injury will improve Outcome: Progressing   Problem: Self-Concept: Goal: Will verbalize positive feelings about self Outcome: Progressing   Problem: Skin Integrity: Goal: Demonstration of wound healing without infection will improve Outcome: Progressing   Problem: Activity: Goal: Will identify at least one activity in which they can participate Outcome: Progressing   Problem:  Coping: Goal: Ability to identify and develop effective coping behavior will improve Outcome: Progressing Goal: Ability to interact with others will improve Outcome: Progressing Goal: Demonstration of participation in decision-making regarding own care will improve Outcome: Progressing Goal: Ability to use eye contact when communicating with others will improve Outcome: Progressing   Problem: Health Behavior/Discharge Planning: Goal: Identification of resources available to assist in meeting health care needs will improve Outcome: Progressing   Problem: Self-Concept: Goal: Will verbalize positive feelings about self Outcome: Progressing   Problem: Safety: Goal: Violent Restraint(s) Outcome: Progressing   Problem: Safety: Goal: Violent Restraint(s) Outcome: Progressing   Problem: Safety: Goal: Violent Restraint(s) Outcome: Progressing

## 2023-01-09 NOTE — Progress Notes (Signed)
Assumed care of patient this pm, she present A&O x 4, affect flat and depressed, mood irritable, angry and verbally threaten, "my mother and brother is here, I'm going to kill them, they took away the only thing that matters to me, my daughter". Pt reported having A/V/H, stating that her mother and brother was in her room,  Pt isolative to her room most of the evening, behavior  became disruptive, talking loudly, singing and threaten and attempted to scratch her arms than refused to allow her door to be closed, and refused to follow redirections. She also ripped the call box from the wall in her room. Pt was placed in the quiet room for closer observations and decrease stimuli. Pt was given her medication per agitation protocol, Benadryl and Ativan  po and she quieted down and fell asleep.. Pt denied thoughts, plan or intent to harm self.  Pt was compliant with taking her scheduled medications and she  educated on plan of care, medication regimen and  behavior expectations and she acknowledged an understanding and was without further complaints or concerns. Pt is been maintained on q 15 min rounds for safety and support.

## 2023-01-09 NOTE — Group Note (Signed)
Recreation Therapy Group Note   Group Topic:Team Building  Group Date: 01/09/2023 Start Time: 1005 End Time: 1045 Facilitators: Shelma Eiben-McCall, LRT,CTRS Location: [address] Dayroom   Group Topic: Communication, Team Building, Problem Solving  Goal Area(s) Addresses:  Patient will effectively work with peer towards shared goal.  Patient will identify skills used to make activity successful.  Patient will share challenges and verbalize solution-driven approaches used. Patient will identify how skills used during activity can be used to reach post d/c goals.   Intervention: STEM Activity   Group Description: [employer]. Patients were provided the following materials: 4 drinking straws, 5 rubber bands, 5 paper clips, 2 index cards and 2 drinking cups. Using the provided materials patients were asked to build a launching mechanism to launch a ping pong ball across the room, approximately 10 feet. Patients were divided into teams of 3-5. Instructions required all materials be incorporated into the device, functionality of items left to the peer group's discretion. LRT also allowed patients to select songs to listen to as they worked on [employer].  Education: Pharmacist, community, Scientist, physiological, Air cabin crew, Building control surveyor.   Education Outcome: Acknowledges education/In group clarification offered/Needs additional education.    Affect/Mood: Flat   Participation Level: Minimal   Participation Quality: Independent   Behavior: Attentive    Speech/Thought Process: Relevant   Insight: None   Judgement: None   Modes of Intervention: Music and STEM Activity   Patient Response to Interventions:  Disengaged   Education Outcome:  In group clarification offered    Clinical Observations/Individualized Feedback: Pt appeared down during group. Pt was disengaged/disinterested in completing the launcher. Pt did listen along to the music and sang along to some of the  songs.    Plan: Continue to engage patient in RT group sessions 2-3x/week.   Cala Kruckenberg-McCall, LRT,CTRS 01/09/2023 12:49 PM

## 2023-01-09 NOTE — Progress Notes (Signed)
Family meeting scheduled  at 1:00 pm with [name], [name]'s uncle Armandina Stammer and [facility] staff to discuss disposition. CSW attempted several times to contact uncle to provide link for Web[employer], messages left. No return call. [name] made aware of uncle not returning calls.  [name] spoke with uncle who reported he was aware of meeting however did not return call to this CSW. Treatment Team and [name] made aware. CSW will continue to follow and update.

## 2023-01-09 NOTE — [facility] Group Notes (Signed)
The focus of this group is to help patients review their daily goal of treatment and discuss progress on daily workbooks. Pt was not present during tonight's wrap up group.  

## 2023-01-09 NOTE — Plan of Care (Signed)
  Problem: Education: Goal: Knowledge of [place] [facility] Education information/materials will improve Outcome: Progressing Goal: Verbalization of understanding the information provided will improve Outcome: Progressing   Problem: Activity: Goal: Sleeping patterns will improve Outcome: Progressing   Problem: Physical Regulation: Goal: Ability to maintain clinical measurements within normal limits will improve Outcome: Progressing   Problem: Education: Goal: Emotional status will improve Outcome: Not Progressing Goal: Mental status will improve Outcome: Not Progressing   Problem: Activity: Goal: Interest or engagement in activities will improve Outcome: Not Progressing   Problem: Coping: Goal: Ability to verbalize frustrations and anger appropriately will improve Outcome: Not Progressing Goal: Ability to demonstrate self-control will improve Outcome: Not Progressing   Problem: Health Behavior/Discharge Planning: Goal: Compliance with treatment plan for underlying cause of condition will improve Outcome: Not Progressing   Problem: Health Behavior/Discharge Planning: Goal: Identification of resources available to assist in meeting health care needs will improve Outcome: Adequate for Discharge

## 2023-01-09 NOTE — Progress Notes (Signed)
[facility] MD Progress Note  01/09/2023 4:20 PM [name]  MRN:  [id number]  HPI: [name] is a 32 yr old female who presented on 11/8 to [facility] with complaints of SI with a plan to OD, she was admitted to [facility] on 11/14.  PPHx is significant for Schizoaffective Disorder, Bipolar Type, Borderline Personality Disorder, GAD, PTSD, and ASD, and multiple Suicide Attempts, history of Self Injurious Behavior (cutting), and multiple Psychiatric Hospitalizations (last [facility] 1024).  Chart review: HR has had sustained elevations since admission, and HR remains elevated today, and was 116 earlier today morning. Pt remains on Rythmol SR 225 mg prescribed prior to hospitalization. Placed page to medicine to address this. Some behavioral episodes reported by nursing who stated that last night, pt was spitting on staff and hitting staff unprovoked. Benadryl & Ativan IM were administered to her last night for agitation at 2136 at per the [facility].   Patient assessment: During encounter today, patient remained depressed, affect remains congruent and flat. She denies SI, denies HI, denies AVH at time of assessment, reported feeling paranoid, reported that she felt as though random people were out to hurt her.  Reassurance provided that she was safe on the unit.  She reports that last night she thought that the staff were "blue demons", and was hitting them thinking that she is hitting the blue demons.  Family meeting was planned today with CSW, Clinical research associate, and attending psychiatrist virtually with patient's uncle, who was supposed to be making arrangements to have patient reside with him, but during time of meeting, uncle did not answer phone.  Calls were repeatedly placed to uncle's number but it was no answer.  If patient's uncle does not return call, it would become necessary for patient's ACT team to be contacted and arrangements made for patient to be discharged out of the hospital in the next few days if  symptoms are stable for at least 48 hrs.   There are no medication related concerns today, and pt denies any medication related side effects. Call has not been received from medicine yet at time of filing of this note, and provider following patient tomorrow to f/u with medicine regarding persistently elevated HR.  No TD/EPS type symptoms found on assessment, and pt denies any feelings of stiffness. AIMS: 0. We will continue all meds as listed below with no changes today.   Principal Problem: Schizoaffective disorder, bipolar type (HCC) Diagnosis: Principal Problem:   Schizoaffective disorder, bipolar type (HCC) Active Problems:   Borderline personality disorder (HCC)   Asperger syndrome   PTSD (post-traumatic stress disorder)   Asthma   Self-injurious behavior   Fibromyalgia   Prolonged QT interval  Total Time spent with patient:  I personally spent 45 minutes on the unit in direct patient care. The direct patient care time included face-to-face time with the patient, reviewing the patient's chart, communicating with other professionals, and coordinating care. Greater than 50% of this time was spent in counseling or coordinating care with the patient regarding goals of hospitalization, psycho-education, and discharge planning needs.   Past Psychiatric History: Schizoaffective Disorder, Bipolar Type, Borderline Personality Disorder, GAD, PTSD, and ASD, and multiple Suicide Attempts, history of Self Injurious Behavior (cutting), and multiple Psychiatric Hospitalizations (last [facility] 1024).  Past Medical History:  Past Medical History:  Diagnosis Date   Acid reflux    Adjustment disorder with mixed anxiety and depressed mood 01/31/2022   Adjustment disorder with mixed disturbance of emotions and conduct 08/03/2019  Anxiety    Asthma    last attack 03/13/15 or 03/14/15   Autism    Bipolar 1 disorder, depressed, severe (HCC) 07/25/2021   Carrier of fragile X syndrome    Chronic  constipation    Depression    Drug-seeking behavior    Essential tremor    Headache    Ineffective individual coping 05/16/2022   Insomnia 01/12/2022   Intentional drug overdose (HCC) 06/05/2022   Neuromuscular disorder (HCC)    Normocytic anemia 06/05/2022   Overdose 07/22/2017   Overdose of acetaminophen 07/2017   and other meds   Overdose, intentional self-harm, initial encounter (HCC) 07/20/2021   Paranoia (HCC) 04/22/2021   Personality disorder (HCC)    Prolonged QT interval    Purposeful non-suicidal drug ingestion (HCC) 06/27/2021   Schizo-affective psychosis (HCC)    Schizoaffective disorder (HCC) 07/29/2022   Schizoaffective disorder, bipolar type (HCC)    Seizures (HCC)    Last seizure December 2017   Skin erythema 04/27/2022   Sleep apnea    Suicidal behavior 07/25/2021   Suicidal ideation    Suicide (HCC) 07/01/2021   Suicide attempt (HCC) 07/04/2021    Past Surgical History:  Procedure Laterality Date   MOUTH SURGERY  2009 or 2010   Family History:  Family History  Problem Relation Age of Onset   Mental illness Father    Asthma Father    PDD Brother    Seizures Brother    Family Psychiatric  History: Mental illness in her father; PDD in her brother; Seizures in her brother.  Social History:  Social History   Substance and Sexual Activity  Alcohol Use No   Alcohol/week: 1.0 standard drink of alcohol   Types: 1 Standard drinks or equivalent per week   Comment: denies at this time     Social History   Substance and Sexual Activity  Drug Use No   Comment: History of cocaine use at age 34 for 4 months    Social History   Socioeconomic History   Marital status: Widowed    Spouse name: Not on file   Number of children: 0   Years of education: Not on file   Highest education level: Not on file  Occupational History   Occupation: disability  Tobacco Use   Smoking status: Former    Types: Cigarettes   Smokeless tobacco: Never   Tobacco  comments:    Smoked for 2  years age [age]  Vaping Use   Vaping status: Never Used  Substance and Sexual Activity   Alcohol use: No    Alcohol/week: 1.0 standard drink of alcohol    Types: 1 Standard drinks or equivalent per week    Comment: denies at this time   Drug use: No    Comment: History of cocaine use at age 45 for 4 months   Sexual activity: Not Currently    Birth control/protection: None  Other Topics Concern   Not on file  Social History Narrative   Marital status: Widowed      Children: daughter      Lives: with boyfriend, in two story home      Employment:  Disability      Tobacco: quit smoking; smoked for two years.      Alcohol ;none      Drugs: none   Has not traveled outside of the country.   Right handed         Patient with hx of Fibromyalgia,Orthostatic Tachycardia,Asthma,Arthritis,Gerd, ASD  Social Determinants of Health   Financial Resource Strain: Not on file  Food Insecurity: No Food Insecurity (12/25/2022)   Hunger Vital Sign    Worried About Running Out of Food in the Last Year: Never true    Ran Out of Food in the Last Year: Never true  Transportation Needs: No Transportation Needs (12/25/2022)   PRAPARE - Administrator, Civil Service (Medical): No    Lack of Transportation (Non-Medical): No  Physical Activity: Not on file  Stress: Not on file  Social Connections: Not on file    Sleep: Good  Appetite:  Good  Current Medications: Current Facility-Administered Medications  Medication Dose Route Frequency Provider Last Rate Last Admin   albuterol (PROVENTIL) (2.5 MG/3ML) 0.083% nebulizer solution 2.5 mg  2.5 mg Nebulization Q6H PRN Weber, Kyra A, NP       alum & mag hydroxide-simeth (MAALOX/MYLANTA) 200-200-20 MG/5ML suspension 30 mL  30 mL Oral Q4H PRN Weber, Bella Kennedy A, NP       bacitracin ointment   Topical BID Starleen Blue, NP   [phone] Application at 01/09/23 0759   diclofenac Sodium (VOLTAREN) 1 % topical gel 4 g  4 g  Topical QID PRN Jearld Lesch, NP   4 g at 01/08/23 2031   diphenhydrAMINE (BENADRYL) capsule 50 mg  50 mg Oral TID PRN Phebe Colla A, NP   50 mg at 01/08/23 1258   Or   diphenhydrAMINE (BENADRYL) injection 50 mg  50 mg Intramuscular TID PRN Weber, Bella Kennedy A, NP   50 mg at 01/08/23 2137   DULoxetine (CYMBALTA) DR capsule 90 mg  90 mg Oral Daily Rex Kras, MD   90 mg at 01/09/23 0758   gabapentin (NEURONTIN) capsule 600 mg  600 mg Oral TID Rex Kras, MD   600 mg at 01/09/23 1412   hydrOXYzine (ATARAX) tablet 25 mg  25 mg Oral TID PRN Starleen Blue, NP   25 mg at 01/07/23 2020   ibuprofen (ADVIL) tablet 600 mg  600 mg Oral Q8H PRN Weber, Kyra A, NP   600 mg at 01/08/23 1158   LORazepam (ATIVAN) tablet 2 mg  2 mg Oral TID PRN Phebe Colla A, NP   2 mg at 01/08/23 1259   Or   LORazepam (ATIVAN) injection 2 mg  2 mg Intramuscular TID PRN Weber, Bella Kennedy A, NP   2 mg at 01/08/23 2136   magnesium hydroxide (MILK OF MAGNESIA) suspension 30 mL  30 mL Oral Daily PRN Weber, Kyra A, NP   30 mL at 01/05/23 0252   melatonin tablet 3 mg  3 mg Oral QHS Victoriya Pol, NP   3 mg at 01/08/23 2031   mometasone-formoterol (DULERA) 200-5 MCG/ACT inhaler 2 puff  2 puff Inhalation BID Ajibola, Ene A, NP   2 puff at 01/09/23 1617   OLANZapine (ZYPREXA) tablet 15 mg  15 mg Oral BID Lauro Franklin, MD   15 mg at 01/09/23 0758   ondansetron (ZOFRAN-ODT) disintegrating tablet 4 mg  4 mg Oral Q8H PRN Weber, Kyra A, NP   4 mg at 01/07/23 0746   pantoprazole (PROTONIX) EC tablet 40 mg  40 mg Oral QAC breakfast Weber, Kyra A, NP   40 mg at 01/09/23 0707   prazosin (MINIPRESS) capsule 1 mg  1 mg Oral QHS Weber, Kyra A, NP   1 mg at 01/08/23 2031   propafenone (RYTHMOL SR) 12 hr capsule 225 mg  225 mg Oral BID Rhea Belton  S, MD   225 mg at 01/09/23 2130   Lab Results: No results found for this or any previous visit (from the past 48 hour(s)).  Blood Alcohol level:  Lab Results  Component Value Date    ETH <10 12/18/2022   ETH <10 12/14/2022    Metabolic Disorder Labs: Lab Results  Component Value Date   HGBA1C 5.4 10/22/2022   MPG 108 10/22/2022   MPG 103 04/20/2022   Lab Results  Component Value Date   PROLACTIN 20.0 12/14/2022   PROLACTIN 66.2 (H) 03/02/2021   Lab Results  Component Value Date   CHOL 188 12/14/2022   TRIG 222 (H) 12/14/2022   HDL 41 12/14/2022   CHOLHDL 4.6 12/14/2022   VLDL 44 (H) 12/14/2022   LDLCALC 103 (H) 12/14/2022   LDLCALC 117 (H) 10/22/2022   Physical Findings: AIMS:0 CIWA:n/a COWS: n/a  Musculoskeletal: Strength & Muscle Tone: within normal limits Gait & Station: normal Patient leans: N/A  Psychiatric Specialty Exam:  Presentation  General Appearance:  Appropriate for Environment; Fairly Groomed  Eye Contact: Fair  Speech: Clear and Coherent  Speech Volume: Normal  Handedness: Right   Mood and Affect  Mood: Depressed; Anxious  Affect: Congruent   Thought Process  Thought Processes: Coherent  Descriptions of Associations:Intact  Orientation:Full (Time, Place and Person)  Thought Content:Logical  History of Schizophrenia/Schizoaffective disorder:Yes  Duration of Psychotic Symptoms:Greater than six months  Hallucinations:Hallucinations: None  Ideas of Reference:None  Suicidal Thoughts:Suicidal Thoughts: No  Homicidal Thoughts:Homicidal Thoughts: No   Sensorium  Memory: Recent Poor  Judgment: Fair  Insight: Fair   Chartered certified accountant: Fair  Attention Span: Fair  Recall: [employer] of Knowledge: Fair  Language: Fair   Psychomotor Activity  Psychomotor Activity: Psychomotor Activity: Normal    Assets  Assets: Resilience   Sleep  Sleep: Sleep: Good    Physical Exam: Physical Exam Vitals and nursing note reviewed.  Constitutional:      Appearance: Normal appearance.  Neurological:     Mental Status: She is alert and oriented to person, place,  and time.  Psychiatric:        Thought Content: Thought content normal.    Review of Systems  Respiratory:  Negative for cough and shortness of breath.   Cardiovascular:  Negative for chest pain.  Gastrointestinal:  Negative for abdominal pain, constipation, diarrhea, nausea and vomiting.  Neurological:  Negative for dizziness, weakness and headaches.  Psychiatric/Behavioral:  Positive for depression and hallucinations (AH). Negative for memory loss, substance abuse and suicidal ideas. The patient is nervous/anxious. The patient does not have insomnia.   All other systems reviewed and are negative.  Blood pressure 121/89, pulse (!) 116, temperature 97.9 F (36.6 C), temperature source Oral, resp. rate 16, height 5\' 4"  (1.626 m), weight 112.5 kg, SpO2 97%. Body mass index is 42.57 kg/m.   Treatment Plan Summary: Daily contact with patient to assess and evaluate symptoms and progress in treatment and Medication management  Schizoaffective Disorder, Bipolar Type: -Continue Zyprexa 15 mg BID for mood stability and psychosis -Continue Cymbalta 90 mg daily for depression and chronic pain -Continue Gabapentin 600 mg TID for anxiety and chronic pain -Continue Prazosin 1 mg QHS for nightmares -Continue Agitation Protocol: Ativan/Benadryl  Asthma: -Continue Dulera 2 puff BID -Continue Albuterol Nebuliation 2.5 mg q6 PRN wheezing  -Continue Propafenone 225 mg BID -Continue Protonix 40 mg daily -Continue Melatonin 3 mg QHS for insomnia -Continue PRN's: Ibuprofen, Maalox, Atarax, Milk of Magnesia, Trazodone  Repeat EKG on 11/20 showed Sinus Tach (108) with Qtc: 487, Repeat EKG ordered today, 12/2, and completed with Qtc lower, but remaining prolonged at 481.   Starleen Blue, NP 01/09/2023, 4:20 PM Patient ID: Elcie Sitler, female   DOB: 1990-05-11, 32 y.o.   MRN: [id number] Patient ID: Jihan Theard, female   DOB: [birth year], 32 y.o.   MRN: [id number] Patient ID: Sabirah Pippert, female   DOB: Jul 20, 1990, 32 y.o.   MRN: [id number]

## 2023-01-09 NOTE — Progress Notes (Signed)
   01/09/23 0800  Psych Admission Type (Psych Patients Only)  Admission Status Voluntary  Psychosocial Assessment  Patient Complaints Anxiety;Irritability;Anger;Agitation  Eye Contact Fair  Facial Expression Anxious;Angry  Affect Appropriate to circumstance  Speech Pressured  Interaction Superficial;Manipulative;Isolative  Motor Activity Fidgety  Appearance/Hygiene Unremarkable  Behavior Characteristics Agitated;Fidgety;Aggressive physically  Mood Angry;Irritable  Thought Process  Coherency Circumstantial  Content Blaming self  Delusions None reported or observed  Perception WDL  Hallucination Auditory  Judgment Poor  Confusion None  Danger to Self  Current suicidal ideation? Denies  Self-Injurious Behavior No self-injurious ideation or behavior indicators observed or expressed   Agreement Not to Harm Self Yes  Description of Agreement Verbal

## 2023-01-10 DIAGNOSIS — F250 Schizoaffective disorder, bipolar type: Secondary | ICD-10-CM | POA: Diagnosis not present

## 2023-01-10 NOTE — Progress Notes (Signed)
CSW contacted [facility] [phone] who reported pt remains on the wait list with no movement. CSW will continue follow and update Treatment Team.

## 2023-01-10 NOTE — Group Note (Signed)
Date:  01/10/2023 Time:  8:23 PM  Group Topic/Focus:  Wrap-Up Group:   The focus of this group is to help patients review their daily goal of treatment and discuss progress on daily workbooks.    Participation Level:  Active  Participation Quality:  Appropriate and Attentive  Affect:  Blunted  Cognitive:  Alert and Appropriate  Insight: Appropriate and Good  Engagement in Group:  Engaged  Modes of Intervention:  Discussion and Education  Additional Comments:  Pt attended and participated in wrap up group this evening and rated their day a 4/10. Pt stated that they showered, went to groups and was able to get their clothes washed. Pt completed their goal, which was to "not do anything destructive". Pt complains of "bad thoughts" towards family members.   Chrisandra Netters 01/10/2023, 8:23 PM

## 2023-01-10 NOTE — Progress Notes (Signed)
CSW spoke with pt in regards to disposition. Pt reported she has talked with her uncle and he shared that he will continue to contact this CSW. CSW shared that several messages were left for uncle with no return call. Pt reported that she hopes she can be accepted at [facility], CSW shared pt is on the wait list and they currently not accepting any patients.  Pt reported that she would like to attend Partial Hospitalization Program  (PHP) while continuing to work with ACTT, [facility]. CSW reached out to ACTT who reported pt can have services simultaneously for 30 days and if pt would like to continue with PHP, ACTT services will cease until PHP has been completed and then ACTT will resume.   CSW will share information with Treatment Team to discuss as a potential discharge plan.   CSW faxed updated notes to [facility].

## 2023-01-10 NOTE — Progress Notes (Signed)
   01/10/23 2300  Psych Admission Type (Psych Patients Only)  Admission Status Voluntary  Psychosocial Assessment  Patient Complaints Anxiety  Eye Contact Fair  Facial Expression Anxious  Affect Anxious  Speech Logical/coherent  Interaction Childlike;Attention-seeking;Manipulative  Motor Activity Fidgety  Appearance/Hygiene Unremarkable  Behavior Characteristics Appropriate to situation  Mood Anxious;Labile  Thought Process  Coherency Circumstantial  Content Blaming others;Preoccupation  Delusions None reported or observed  Perception WDL  Hallucination None reported or observed  Judgment Poor  Confusion None  Danger to Self  Current suicidal ideation? Denies  Self-Injurious Behavior Some self-injurious ideation observed or expressed.  No lethal plan expressed   Agreement Not to Harm Self Yes  Description of Agreement Verbal

## 2023-01-10 NOTE — Progress Notes (Signed)
Pt on unit, anxious affect noted. Pt denies SI/HI/AVH at this time. Pt reports she ripped call box off in her room because she heard her parents talking to her through it. Medication compliant. Will monitor.

## 2023-01-10 NOTE — Plan of Care (Signed)
  Problem: Education: Goal: Knowledge of [facility] Education information/materials will improve Outcome: Progressing Goal: Emotional status will improve Outcome: Progressing Goal: Mental status will improve Outcome: Progressing Goal: Verbalization of understanding the information provided will improve Outcome: Progressing   Problem: Activity: Goal: Interest or engagement in activities will improve Outcome: Progressing Goal: Sleeping patterns will improve Outcome: Progressing   Problem: Coping: Goal: Ability to verbalize frustrations and anger appropriately will improve Outcome: Progressing Goal: Ability to demonstrate self-control will improve Outcome: Progressing   Problem: Health Behavior/Discharge Planning: Goal: Identification of resources available to assist in meeting health care needs will improve Outcome: Progressing Goal: Compliance with treatment plan for underlying cause of condition will improve Outcome: Progressing   Problem: Physical Regulation: Goal: Ability to maintain clinical measurements within normal limits will improve Outcome: Progressing   Problem: Safety: Goal: Periods of time without injury will increase Outcome: Progressing   Problem: Education: Goal: Ability to incorporate positive changes in behavior to improve self-esteem will improve Outcome: Progressing   Problem: Health Behavior/Discharge Planning: Goal: Ability to identify and utilize available resources and services will improve Outcome: Progressing Goal: Ability to remain free from injury will improve Outcome: Progressing   Problem: Self-Concept: Goal: Will verbalize positive feelings about self Outcome: Progressing   Problem: Skin Integrity: Goal: Demonstration of wound healing without infection will improve Outcome: Progressing   Problem: Activity: Goal: Will identify at least one activity in which they can participate Outcome: Progressing   Problem:  Coping: Goal: Ability to identify and develop effective coping behavior will improve Outcome: Progressing Goal: Ability to interact with others will improve Outcome: Progressing Goal: Demonstration of participation in decision-making regarding own care will improve Outcome: Progressing Goal: Ability to use eye contact when communicating with others will improve Outcome: Progressing   Problem: Health Behavior/Discharge Planning: Goal: Identification of resources available to assist in meeting health care needs will improve Outcome: Progressing   Problem: Self-Concept: Goal: Will verbalize positive feelings about self Outcome: Progressing   Problem: Safety: Goal: Violent Restraint(s) Outcome: Progressing   Problem: Safety: Goal: Violent Restraint(s) Outcome: Progressing

## 2023-01-10 NOTE — Progress Notes (Signed)
   01/10/23 1300  Psych Admission Type (Psych Patients Only)  Admission Status Voluntary  Psychosocial Assessment  Patient Complaints Anxiety;Self-harm thoughts  Eye Contact Fair  Facial Expression Anxious  Affect Anxious;Depressed  Speech Pressured;Rapid  Interaction Childlike;Manipulative;Attention-seeking  Motor Activity Fidgety  Appearance/Hygiene Unremarkable  Behavior Characteristics Fidgety  Mood Labile;Anxious  Thought Process  Coherency Circumstantial  Content Blaming others  Delusions Persecutory  Perception Hallucinations  Hallucination Auditory  Judgment Poor  Confusion None  Danger to Self  Current suicidal ideation? Denies  Agreement Not to Harm Self Yes  Danger to Others Abnormal  Harmful Behavior to others No threats or harm toward other people   Pt endorses auditory hallucinations telling her to bang her head on the wall. Pt offered to sit in seclusion room with door open due to padded walls. Pt sat in room for approximately 30 minutes off and on. Pt denies SI/HI.

## 2023-01-10 NOTE — Plan of Care (Signed)
°  Problem: Education: °Goal: Emotional status will improve °Outcome: Progressing °Goal: Mental status will improve °Outcome: Progressing °Goal: Verbalization of understanding the information provided will improve °Outcome: Progressing °  °

## 2023-01-10 NOTE — Group Note (Signed)
Recreation Therapy Group Note   Group Topic:Self-Esteem  Group Date: 01/10/2023 Start Time: 1000 End Time: 1030 Facilitators: Talen Poser-McCall, LRT,CTRS Location: [address] Dayroom   Group Topic: Self-esteem  Goal Area(s) Addresses:  Patient will identify and write at least one positive trait about themself. Patient will acknowledge the benefit of healthy self-esteem. Patient will endorse understanding of ways to increase self-esteem.    Intervention: Personalized Plate- printed license plate template, markers or colored pencils   Group Description:  LRT began group session with open dialogue asking the patients to define self-esteem and verbally identify positive qualities and traits people may possess. Patients were then instructed to design a personalized license plate, with words and drawings, representing at least 3 positive things about themselves. Pts were encouraged to include favorites, things they are proud of, what they enjoy doing, and dreams for their future. If a patient had a life motto or a meaningful phase that expressed their life values, pt's were asked to incorporate that into their design as well. Patients were given the opportunity to share their completed work with the group.   Education: Healthy self-esteem, Positive character traits, Accepting compliments, Leisure as competence and coping, Support Systems, Discharge planning LRT educated patients on the importance of healthy self-esteem and ways to build self-esteem. LRT addressed discharge planning reviewing positive coping skills and healthy support systems.  Education Outcome:  Acknowledges education/In group clarification offered    Affect/Mood: Appropriate   Participation Level: Engaged   Participation Quality: Independent   Behavior: Calm and Cooperative   Speech/Thought Process: Coherent and Focused   Insight: Good   Judgement: Good   Modes of Intervention: Art   Patient Response to  Interventions:  Engaged   Education Outcome:  In group clarification offered    Clinical Observations/Individualized Feedback: Pt was engaged and focused. Pt took time answering the questions and decorating license plate. Pt identified the words that define her are "never gives up, bounces back, resilience, honest, loving and dedicated". Pt identified some of her favorite things as the color pink, christian music, pizza and ice cream. Pt explained her biggest accomplishments were graduating college in 2020 and 2022 and also having a daughter. Pt hobby is doing diamond painting; goal is to not go back to the hospital and get daughter back. Pt identified 'stay strong, never give up' as the motto  she lives by.    Plan: Continue to engage patient in RT group sessions 2-3x/week.   [name], LRT,CTRS  01/10/2023 11:15 AM

## 2023-01-10 NOTE — Progress Notes (Signed)
Pt used call button to call nurse and tell her she does not feel safe. When nurse inquired further pt states she doesn't feel safe around anyone including staff. Nurse informed pt she is safe here and offered supportive listening. Pt denies AVH at this time.

## 2023-01-10 NOTE — Progress Notes (Signed)
[facility] MD Progress Note  01/10/2023 1:17 PM [name]  MRN:  [id number] Subjective:  [name] was seen this morning in the common area. She acknowledges that she damaged property but plans to not do that again. She says "I thought my parents were talking to me". She heard the voices of her parents overnight and they were saying "mean things". It interfered with her sleep. She is not currently hearing them, but still has active HI towards her family. She also has active suicidal ideation. She was in and out of the quiet room today.  Principal Problem: Schizoaffective disorder, bipolar type (HCC) Diagnosis: Principal Problem:   Schizoaffective disorder, bipolar type (HCC) Active Problems:   Borderline personality disorder (HCC)   Asperger syndrome   PTSD (post-traumatic stress disorder)   Asthma   Self-injurious behavior   Fibromyalgia   Prolonged QT interval  Total Time spent with patient: 20 minutes  Past Psychiatric History:  Schizoaffective Disorder, Bipolar Type, Borderline Personality Disorder, GAD, PTSD, and ASD, and multiple Suicide Attempts, history of Self Injurious Behavior (cutting), and multiple Psychiatric Hospitalizations (last [facility]).   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    Chronic constipation    Depression    Drug-seeking behavior    Essential tremor    Headache    Ineffective individual coping 05/16/2022   Insomnia 01/12/2022   Intentional drug overdose (HCC) 06/05/2022   Neuromuscular disorder (HCC)    Normocytic anemia 06/05/2022   Overdose 07/22/2017   Overdose of acetaminophen 07/2017   and other  meds   Overdose, intentional self-harm, initial encounter (HCC) 07/20/2021   Paranoia (HCC) 04/22/2021   Personality disorder (HCC)    Prolonged QT interval    Purposeful non-suicidal drug ingestion (HCC) 06/27/2021   Schizo-affective psychosis (HCC)    Schizoaffective disorder (HCC) 07/29/2022   Schizoaffective disorder, bipolar type (HCC)    Seizures (HCC)    Last seizure December 2017   Skin erythema 04/27/2022   Sleep apnea    Suicidal behavior 07/25/2021   Suicidal ideation    Suicide (HCC) 07/01/2021   Suicide attempt (HCC) 07/04/2021    Past Surgical History:  Procedure Laterality Date   MOUTH SURGERY  2009 or 2010   Family History:  Family History  Problem Relation Age of Onset   Mental illness Father    Asthma Father    PDD Brother    Seizures Brother    Family Psychiatric  History: Mental illness in her father; PDD in her brother; Seizures in her brother.  Social History:  Social History   Substance and Sexual Activity  Alcohol Use No   Alcohol/week: 1.0 standard drink of alcohol   Types: 1 Standard drinks or equivalent per week   Comment: denies at this time     Social History   Substance and Sexual Activity  Drug Use No   Comment: History of cocaine use at age 40 for 4 months    Social History   Socioeconomic History   Marital status: Widowed    Spouse name: Not on file   Number of children: 0  Years of education: Not on file   Highest education level: Not on file  Occupational History   Occupation: disability  Tobacco Use   Smoking status: Former    Types: Cigarettes   Smokeless tobacco: Never   Tobacco comments:    Smoked for 2  years age [age]  Vaping Use   Vaping status: Never Used  Substance and Sexual Activity   Alcohol use: No    Alcohol/week: 1.0 standard drink of alcohol    Types: 1 Standard drinks or equivalent per week    Comment: denies at this time   Drug use: No    Comment: History of cocaine use at age 32 for 4 months    Sexual activity: Not Currently    Birth control/protection: None  Other Topics Concern   Not on file  Social History Narrative   Marital status: Widowed      Children: daughter      Lives: with boyfriend, in two story home      Employment:  Disability      Tobacco: quit smoking; smoked for two years.      Alcohol ;none      Drugs: none   Has not traveled outside of the country.   Right handed         Patient with hx of Fibromyalgia,Orthostatic Tachycardia,Asthma,Arthritis,Gerd, ASD   Social Determinants of Health   Financial Resource Strain: Not on file  Food Insecurity: No Food Insecurity (12/25/2022)   Hunger Vital Sign    Worried About Running Out of Food in the Last Year: Never true    Ran Out of Food in the Last Year: Never true  Transportation Needs: No Transportation Needs (12/25/2022)   PRAPARE - Administrator, Civil Service (Medical): No    Lack of Transportation (Non-Medical): No  Physical Activity: Not on file  Stress: Not on file  Social Connections: Not on file   Additional Social History:                         Sleep: Poor  Appetite:  Fair  Current Medications: Current Facility-Administered Medications  Medication Dose Route Frequency Provider Last Rate Last Admin   albuterol (PROVENTIL) (2.5 MG/3ML) 0.083% nebulizer solution 2.5 mg  2.5 mg Nebulization Q6H PRN Weber, Kyra A, NP       alum & mag hydroxide-simeth (MAALOX/MYLANTA) 200-200-20 MG/5ML suspension 30 mL  30 mL Oral Q4H PRN Weber, Bella Kennedy A, NP       bacitracin ointment   Topical BID Starleen Blue, NP   [phone] Application at 01/10/23 0742   diclofenac Sodium (VOLTAREN) 1 % topical gel 4 g  4 g Topical QID PRN Jearld Lesch, NP   4 g at 01/10/23 0744   diphenhydrAMINE (BENADRYL) capsule 50 mg  50 mg Oral TID PRN Phebe Colla A, NP   50 mg at 01/09/23 2145   Or   diphenhydrAMINE (BENADRYL) injection 50 mg  50 mg Intramuscular TID PRN Weber, Bella Kennedy A, NP   50 mg at 01/08/23  2137   DULoxetine (CYMBALTA) DR capsule 90 mg  90 mg Oral Daily Rex Kras, MD   90 mg at 01/10/23 0741   gabapentin (NEURONTIN) capsule 600 mg  600 mg Oral TID Rex Kras, MD   600 mg at 01/10/23 0742   hydrOXYzine (ATARAX) tablet 25 mg  25 mg Oral TID PRN Starleen Blue, NP   25 mg at 01/10/23 1052  ibuprofen (ADVIL) tablet 600 mg  600 mg Oral Q8H PRN Weber, Kyra A, NP   600 mg at 01/10/23 0156   LORazepam (ATIVAN) tablet 2 mg  2 mg Oral TID PRN Phebe Colla A, NP   2 mg at 01/09/23 2145   Or   LORazepam (ATIVAN) injection 2 mg  2 mg Intramuscular TID PRN Phebe Colla A, NP   2 mg at 01/08/23 2136   magnesium hydroxide (MILK OF MAGNESIA) suspension 30 mL  30 mL Oral Daily PRN Weber, Kyra A, NP   30 mL at 01/05/23 0252   melatonin tablet 3 mg  3 mg Oral QHS Nkwenti, Doris, NP   3 mg at 01/09/23 2035   mometasone-formoterol (DULERA) 200-5 MCG/ACT inhaler 2 puff  2 puff Inhalation BID Ajibola, Ene A, NP   2 puff at 01/10/23 0741   OLANZapine (ZYPREXA) tablet 15 mg  15 mg Oral BID Lauro Franklin, MD   15 mg at 01/10/23 0741   ondansetron (ZOFRAN-ODT) disintegrating tablet 4 mg  4 mg Oral Q8H PRN Weber, Kyra A, NP   4 mg at 01/07/23 0746   pantoprazole (PROTONIX) EC tablet 40 mg  40 mg Oral QAC breakfast Weber, Kyra A, NP   40 mg at 01/10/23 0721   prazosin (MINIPRESS) capsule 1 mg  1 mg Oral QHS Weber, Kyra A, NP   1 mg at 01/09/23 2035   propafenone (RYTHMOL SR) 12 hr capsule 225 mg  225 mg Oral BID Lauro Franklin, MD   225 mg at 01/10/23 0454    Lab Results: No results found for this or any previous visit (from the past 48 hour(s)).  Blood Alcohol level:  Lab Results  Component Value Date   ETH <10 12/18/2022   ETH <10 12/14/2022    Metabolic Disorder Labs: Lab Results  Component Value Date   HGBA1C 5.4 10/22/2022   MPG 108 10/22/2022   MPG 103 04/20/2022   Lab Results  Component Value Date   PROLACTIN 20.0 12/14/2022   PROLACTIN 66.2 (H) 03/02/2021    Lab Results  Component Value Date   CHOL 188 12/14/2022   TRIG 222 (H) 12/14/2022   HDL 41 12/14/2022   CHOLHDL 4.6 12/14/2022   VLDL 44 (H) 12/14/2022   LDLCALC 103 (H) 12/14/2022   LDLCALC 117 (H) 10/22/2022    Physical Findings: AIMS:  , ,  ,  ,    CIWA:    COWS:     Musculoskeletal: Strength & Muscle Tone: within normal limits Gait & Station: normal Patient leans: N/A  Psychiatric Specialty Exam:  Presentation  General Appearance:  Appropriate for Environment  Eye Contact: Good  Speech: Normal Rate  Speech Volume: Normal  Handedness: Right   Mood and Affect  Mood: Depressed  Affect: Flat   Thought Process  Thought Processes: Linear  Descriptions of Associations:Intact  Orientation:Full (Time, Place and Person)  Thought Content:Paranoid Ideation  History of Schizophrenia/Schizoaffective disorder:Yes  Duration of Psychotic Symptoms:Greater than six months  Hallucinations:Hallucinations: None  Ideas of Reference:Paranoia  Suicidal Thoughts:Suicidal Thoughts: Yes, Active SI Active Intent and/or Plan: Without Plan; Without Means to Carry Out  Homicidal Thoughts:Homicidal Thoughts: Yes, Active HI Active Intent and/or Plan: Without Plan; Without Means to Carry Out   Sensorium  Memory: Immediate Fair; Recent Fair  Judgment: Poor  Insight: Poor   Executive Functions  Concentration: Fair  Attention Span: Fair  Recall: [employer] of Knowledge: Fair  Language: Fair   Psychomotor Activity  Psychomotor Activity: Psychomotor Activity: Normal   Assets  Assets: Communication Skills; Leisure Time   Sleep  Sleep: Sleep: Poor    Physical Exam: Physical Exam Vitals and nursing note reviewed.  Constitutional:      Appearance: Normal appearance.  HENT:     Head: Normocephalic and atraumatic.  Eyes:     Extraocular Movements: Extraocular movements intact.  Pulmonary:     Effort: Pulmonary effort is normal.   Musculoskeletal:        General: Normal range of motion.     Cervical back: Normal range of motion.  Neurological:     General: No focal deficit present.     Mental Status: She is alert and oriented to person, place, and time.  Psychiatric:        Mood and Affect: Mood is depressed. Affect is flat.        Thought Content: Thought content is paranoid.    Review of Systems  Constitutional:  Negative for fever.  Musculoskeletal:  Negative for myalgias.  Skin:        Healing scratches/abrasions   Neurological:  Negative for tremors.  Psychiatric/Behavioral:  Positive for suicidal ideas.    Blood pressure (!) 159/86, pulse (!) 108, temperature 98.3 F (36.8 C), temperature source Oral, resp. rate 16, height 5\' 4"  (1.626 m), weight 112.5 kg, SpO2 100%. Body mass index is 42.57 kg/m.   Treatment Plan Summary: Daily contact with patient to assess and evaluate symptoms and progress in treatment and Medication management   Schizoaffective Disorder, Bipolar Type: -Continue Zyprexa 15 mg BID for mood stability and psychosis -Continue Cymbalta 90 mg daily for depression and chronic pain -Continue Gabapentin 600 mg TID for anxiety and chronic pain -Continue Prazosin 1 mg QHS for nightmares -Continue Agitation Protocol: Ativan/Benadryl   Asthma: -Continue Dulera 2 puff BID -Continue Albuterol Nebuliation 2.5 mg q6 PRN wheezing   -Continue Propafenone 225 mg BID -Continue Protonix 40 mg daily -Continue Melatonin 3 mg QHS for insomnia -Continue PRN's: Ibuprofen, Maalox, Atarax, Milk of Magnesia, Trazodone   Repeat EKG on 11/20 showed Sinus Tach (108) with Qtc: 487, Repeat EKG 12/2, and completed with Qtc lower, but remaining prolonged at 481.  Roselle Locus, MD 01/10/2023, 1:17 PM

## 2023-01-10 NOTE — Group Note (Signed)
Date:  01/10/2023 Time:  9:00 AM  Group Topic/Focus:  Goals Group:   The focus of this group is to help patients establish daily goals to achieve during treatment and discuss how the patient can incorporate goal setting into their daily lives to aide in recovery.    Participation Level:  Active  Participation Quality:  Appropriate  Affect:  Appropriate  Cognitive:  Appropriate  Insight: Appropriate  Engagement in Group:  Engaged  Modes of Intervention:  Discussion  Additional Comments:  Pt said her goal today is to" Not destroy property."  Donell Beers 01/10/2023, 9:00 AM

## 2023-01-11 ENCOUNTER — Encounter (HOSPITAL_COMMUNITY): Payer: Self-pay

## 2023-01-11 DIAGNOSIS — F250 Schizoaffective disorder, bipolar type: Secondary | ICD-10-CM | POA: Diagnosis not present

## 2023-01-11 MED ORDER — WHITE PETROLATUM EX OINT
TOPICAL_OINTMENT | CUTANEOUS | Status: AC
  Administered 2023-01-11: 1
  Filled 2023-01-11: qty 5

## 2023-01-11 MED ORDER — WHITE PETROLATUM EX OINT
TOPICAL_OINTMENT | CUTANEOUS | Status: AC
  Filled 2023-01-11: qty 5

## 2023-01-11 NOTE — Progress Notes (Signed)
CSW faxed [phone] updated clinicals to [facility] [phone] who reported they have openings for admissions. [facility] reported they will contact [facility] at [phone] if they are able to accept pt. [facility] will admit over the weekend. CSW will continue to follow and update Treatment Team.

## 2023-01-11 NOTE — Plan of Care (Signed)
  Problem: Education: Goal: Knowledge of [facility] Education information/materials will improve Outcome: Progressing Goal: Emotional status will improve Outcome: Progressing Goal: Mental status will improve Outcome: Progressing Goal: Verbalization of understanding the information provided will improve Outcome: Progressing   Problem: Activity: Goal: Interest or engagement in activities will improve Outcome: Progressing Goal: Sleeping patterns will improve Outcome: Progressing   Problem: Coping: Goal: Ability to verbalize frustrations and anger appropriately will improve Outcome: Progressing Goal: Ability to demonstrate self-control will improve Outcome: Progressing   Problem: Health Behavior/Discharge Planning: Goal: Identification of resources available to assist in meeting health care needs will improve Outcome: Progressing Goal: Compliance with treatment plan for underlying cause of condition will improve Outcome: Progressing   Problem: Physical Regulation: Goal: Ability to maintain clinical measurements within normal limits will improve Outcome: Progressing   Problem: Safety: Goal: Periods of time without injury will increase Outcome: Progressing   Problem: Education: Goal: Ability to incorporate positive changes in behavior to improve self-esteem will improve Outcome: Progressing   Problem: Health Behavior/Discharge Planning: Goal: Ability to identify and utilize available resources and services will improve Outcome: Progressing Goal: Ability to remain free from injury will improve Outcome: Progressing   Problem: Self-Concept: Goal: Will verbalize positive feelings about self Outcome: Progressing   Problem: Skin Integrity: Goal: Demonstration of wound healing without infection will improve Outcome: Progressing   Problem: Activity: Goal: Will identify at least one activity in which they can participate Outcome: Progressing   Problem:  Coping: Goal: Ability to identify and develop effective coping behavior will improve Outcome: Progressing Goal: Ability to interact with others will improve Outcome: Progressing Goal: Demonstration of participation in decision-making regarding own care will improve Outcome: Progressing Goal: Ability to use eye contact when communicating with others will improve Outcome: Progressing   Problem: Health Behavior/Discharge Planning: Goal: Identification of resources available to assist in meeting health care needs will improve Outcome: Progressing   Problem: Self-Concept: Goal: Will verbalize positive feelings about self Outcome: Progressing   Problem: Safety: Goal: Violent Restraint(s) Outcome: Progressing   Problem: Safety: Goal: Violent Restraint(s) Outcome: Progressing

## 2023-01-11 NOTE — BH IP Treatment Plan (Signed)
Interdisciplinary Treatment and Diagnostic Plan Update  01/11/2023 Time of Session: 12:35 Update [name] MRN: [id number]  Principal Diagnosis: Schizoaffective disorder, bipolar type (HCC)  Secondary Diagnoses: Principal Problem:   Schizoaffective disorder, bipolar type (HCC) Active Problems:   Borderline personality disorder (HCC)   Asperger syndrome   PTSD (post-traumatic stress disorder)   Asthma   Self-injurious behavior   Fibromyalgia   Prolonged QT interval   Current Medications:  Current Facility-Administered Medications  Medication Dose Route Frequency Provider Last Rate Last Admin   albuterol (PROVENTIL) (2.5 MG/3ML) 0.083% nebulizer solution 2.5 mg  2.5 mg Nebulization Q6H PRN Weber, Kyra A, NP       alum & mag hydroxide-simeth (MAALOX/MYLANTA) 200-200-20 MG/5ML suspension 30 mL  30 mL Oral Q4H PRN Weber, Kyra A, NP       bacitracin ointment   Topical BID Starleen Blue, NP   [phone] Application at 01/11/23 0742   diclofenac Sodium (VOLTAREN) 1 % topical gel 4 g  4 g Topical QID PRN Jearld Lesch, NP   4 g at 01/10/23 0744   diphenhydrAMINE (BENADRYL) capsule 50 mg  50 mg Oral TID PRN Phebe Colla A, NP   50 mg at 01/09/23 2145   Or   diphenhydrAMINE (BENADRYL) injection 50 mg  50 mg Intramuscular TID PRN Weber, Bella Kennedy A, NP   50 mg at 01/08/23 2137   DULoxetine (CYMBALTA) DR capsule 90 mg  90 mg Oral Daily Rex Kras, MD   90 mg at 01/11/23 0743   gabapentin (NEURONTIN) capsule 600 mg  600 mg Oral TID Rex Kras, MD   600 mg at 01/11/23 1311   hydrOXYzine (ATARAX) tablet 25 mg  25 mg Oral TID PRN Starleen Blue, NP   25 mg at 01/10/23 1052   ibuprofen (ADVIL) tablet 600 mg  600 mg Oral Q8H PRN Weber, Kyra A, NP   600 mg at 01/11/23 0231   LORazepam (ATIVAN) tablet 2 mg  2 mg Oral TID PRN Phebe Colla A, NP   2 mg at 01/09/23 2145   Or   LORazepam (ATIVAN) injection 2 mg  2 mg Intramuscular TID PRN Weber, Bella Kennedy A, NP   2 mg at 01/08/23 2136    magnesium hydroxide (MILK OF MAGNESIA) suspension 30 mL  30 mL Oral Daily PRN Weber, Kyra A, NP   30 mL at 01/05/23 0252   melatonin tablet 3 mg  3 mg Oral QHS Nkwenti, Doris, NP   3 mg at 01/10/23 2006   mometasone-formoterol (DULERA) 200-5 MCG/ACT inhaler 2 puff  2 puff Inhalation BID Ajibola, Ene A, NP   2 puff at 01/11/23 0741   OLANZapine (ZYPREXA) tablet 15 mg  15 mg Oral BID Lauro Franklin, MD   15 mg at 01/11/23 0743   ondansetron (ZOFRAN-ODT) disintegrating tablet 4 mg  4 mg Oral Q8H PRN Weber, Kyra A, NP   4 mg at 01/07/23 0746   pantoprazole (PROTONIX) EC tablet 40 mg  40 mg Oral QAC breakfast Weber, Kyra A, NP   40 mg at 01/11/23 0617   prazosin (MINIPRESS) capsule 1 mg  1 mg Oral QHS Weber, Kyra A, NP   1 mg at 01/10/23 2006   propafenone (RYTHMOL SR) 12 hr capsule 225 mg  225 mg Oral BID Lauro Franklin, MD   225 mg at 01/11/23 9147   PTA Medications: Medications Prior to Admission  Medication Sig Dispense Refill Last Dose   albuterol (VENTOLIN HFA) 108 (90 Base) MCG/ACT  inhaler Inhale 2 puffs into the lungs every 6 (six) hours as needed for wheezing or shortness of breath. 1 each 0    ARIPiprazole (ABILIFY) 2 MG tablet Take 1 tablet (2 mg total) by mouth daily for 7 days. 7 tablet 0    Bacitracin-Polymyxin B (NEOSPORIN EX) Apply 1 Application topically as needed (Cuts).      Calcium Carbonate Antacid (TUMS PO) Take 2 tablets by mouth as needed (Heartburn).      cyclobenzaprine (FLEXERIL) 10 MG tablet Take 10 mg by mouth at bedtime.      diclofenac Sodium (VOLTAREN) 1 % GEL Apply 2 g topically 4 (four) times daily. (Patient taking differently: Apply 2 g topically in the morning, at noon, and at bedtime.)      DULoxetine (CYMBALTA) 60 MG capsule Take 1 capsule (60 mg total) by mouth daily for 7 days. 7 capsule 0    gabapentin (NEURONTIN) 600 MG tablet Take 600 mg by mouth 3 (three) times daily.      hydrOXYzine (ATARAX) 25 MG tablet Take 25 mg by mouth 4 (four) times  daily as needed for anxiety.      mometasone-formoterol (DULERA) 200-5 MCG/ACT AERO Inhale 2 puffs into the lungs 2 (two) times daily. 1 each 0    OLANZapine (ZYPREXA) 15 MG tablet Take 15 mg by mouth at bedtime.      OLANZapine (ZYPREXA) 5 MG tablet Take 3 tablets (15 mg total) by mouth at bedtime for 7 days. (Patient taking differently: Take 5 mg by mouth in the morning and at bedtime. Take 1 tablet at 1200 and 1700) 21 tablet 0    ondansetron (ZOFRAN-ODT) 4 MG disintegrating tablet Take 1 tablet (4 mg total) by mouth every 8 (eight) hours as needed for nausea or vomiting. 10 tablet 0    pantoprazole (PROTONIX) 40 MG tablet Take 1 tablet (40 mg total) by mouth daily before breakfast for 7 days. 7 tablet 0    prazosin (MINIPRESS) 1 MG capsule Take 1 capsule (1 mg total) by mouth at bedtime for 7 days. 7 capsule 0    propafenone (RYTHMOL SR) 225 MG 12 hr capsule Take 225 mg by mouth 2 (two) times daily.       Patient Stressors:    Patient Strengths:    Treatment Modalities: Medication Management, Group therapy, Case management,  1 to 1 session with clinician, Psychoeducation, Recreational therapy.   Physician Treatment Plan for Primary Diagnosis: Schizoaffective disorder, bipolar type (HCC) Long Term Goal(s): Improvement in symptoms so as ready for discharge   Short Term Goals: Ability to identify changes in lifestyle to reduce recurrence of condition will improve Ability to verbalize feelings will improve Ability to disclose and discuss suicidal ideas Ability to demonstrate self-control will improve Ability to identify and develop effective coping behaviors will improve Ability to maintain clinical measurements within normal limits will improve Compliance with prescribed medications will improve  Medication Management: Evaluate patient's response, side effects, and tolerance of medication regimen.  Therapeutic Interventions: 1 to 1 sessions, Unit Group sessions and Medication  administration.  Evaluation of Outcomes: Progressing  Physician Treatment Plan for Secondary Diagnosis: Principal Problem:   Schizoaffective disorder, bipolar type (HCC) Active Problems:   Borderline personality disorder (HCC)   Asperger syndrome   PTSD (post-traumatic stress disorder)   Asthma   Self-injurious behavior   Fibromyalgia   Prolonged QT interval  Long Term Goal(s): Improvement in symptoms so as ready for discharge   Short Term Goals: Ability to identify  changes in lifestyle to reduce recurrence of condition will improve Ability to verbalize feelings will improve Ability to disclose and discuss suicidal ideas Ability to demonstrate self-control will improve Ability to identify and develop effective coping behaviors will improve Ability to maintain clinical measurements within normal limits will improve Compliance with prescribed medications will improve     Medication Management: Evaluate patient's response, side effects, and tolerance of medication regimen.  Therapeutic Interventions: 1 to 1 sessions, Unit Group sessions and Medication administration.  Evaluation of Outcomes: Progressing   RN Treatment Plan for Primary Diagnosis: Schizoaffective disorder, bipolar type (HCC) Long Term Goal(s): Knowledge of disease and therapeutic regimen to maintain health will improve  Short Term Goals: Ability to remain free from injury will improve and Ability to disclose and discuss suicidal ideas  Medication Management: RN will administer medications as ordered by provider, will assess and evaluate patient's response and provide education to patient for prescribed medication. RN will report any adverse and/or side effects to prescribing provider.  Therapeutic Interventions: 1 on 1 counseling sessions, Psychoeducation, Medication administration, Evaluate responses to treatment, Monitor vital signs and CBGs as ordered, Perform/monitor CIWA, COWS, AIMS and Fall Risk screenings as  ordered, Perform wound care treatments as ordered.  Evaluation of Outcomes: Progressing   LCSW Treatment Plan for Primary Diagnosis: Schizoaffective disorder, bipolar type (HCC) Long Term Goal(s): Safe transition to appropriate next level of care at discharge, Engage patient in therapeutic group addressing interpersonal concerns.  Short Term Goals: Engage patient in aftercare planning with referrals and resources and Facilitate acceptance of mental health diagnosis and concerns  Therapeutic Interventions: Assess for all discharge needs, 1 to 1 time with Social worker, Explore available resources and support systems, Assess for adequacy in community support network, Educate family and significant other(s) on suicide prevention, Complete Psychosocial Assessment, Interpersonal group therapy.  Evaluation of Outcomes: Progressing   Progress in Treatment: Attending groups: Yes. Participating in groups: Yes. Taking medication as prescribed: Yes. Toleration medication: Yes. Family/Significant other contact made: Yes, contacted:  [name], QP ([employer]) [phone] / Shanice therapist ([employer]) [phone] Patient understands diagnosis: Yes. Discussing patient identified problems/goals with staff: Yes. Medical problems stabilized or resolved: Yes. Denies suicidal/homicidal ideation: Yes. Issues/concerns per patient self-inventory: No.     New problem(s) identified: No, Describe:  None reported    New Short Term/Long Term Goal(s):   medication stabilization, elimination of SI thoughts, development of comprehensive mental wellness plan.     Patient Goals:  " Go home tomorrow, reach out to my ACT team about bringing forms out to my house, and tell them that I do not want to speak to a female when I call there crisis line "    Discharge Plan or Barriers: Patient recently admitted. CSW will continue to follow and assess for appropriate referrals and possible discharge planning.      Reason for Continuation of Hospitalization: Anxiety Delusions  Depression Medication stabilization Suicidal ideation   Estimated Length of Stay: 3-4 DAYS  Last 3 [place] Suicide Severity Risk Score: Flowsheet Row ED to Hosp-Admission (Current) [facility] from 12[facility]18/2022 in [facility] Emergency Department at [facility] ED from 12/17/2022 in [facility] Emergency Department at [facility]  C-SSRS RISK CATEGORY High Risk High Risk High Risk       Last [facility] 2/9 Scores:    03/10/2022   10:01 PM 03/10/2022    9:56 PM 02/10/2022    1:49 AM  Depression screen PHQ 2/9  Decreased Interest 1 1 2   Down, Depressed, Hopeless 1 1 2   PHQ - 2 Score 2 2 4   Altered sleeping 1 1 2   Tired, decreased energy 1 1 2   Change in appetite 1 0 2  Feeling bad or failure about yourself  1 1 2   Trouble concentrating 1 1 2   Moving slowly or fidgety/restless 1 1 1   Suicidal thoughts 1 1 1   PHQ-9 Score 9 8 16   Difficult doing work/chores Somewhat difficult  Very difficult    Scribe for Treatment Team: Marinda Elk, LCSW 01/11/2023 1:46 PM

## 2023-01-11 NOTE — BHH Group Notes (Signed)
Spirituality group facilitated by Kathleen Argue, BCC.  Group Description: Group focused on topic of hope. Patients participated in facilitated discussion around topic, connecting with one another around experiences and definitions for hope. Group members engaged with visual explorer photos, reflecting on what hope looks like for them today. Group engaged in discussion around how their definitions of hope are present today in hospital.  Modalities: Psycho-social ed, Adlerian, Narrative, MI  Patient Progress: Nyasia came in at the very end of group and was tearful.  Chaplain met with her individually after group and will chart this in a different note.

## 2023-01-11 NOTE — Group Note (Unsigned)
Date:  01/11/2023 Time:  8:30 PM  Group Topic/Focus:  Wrap-Up Group:   The focus of this group is to help patients review their daily goal of treatment and discuss progress on daily workbooks.     Participation Level:  {BHH PARTICIPATION WNUUV:25366}  Participation Quality:  {BHH PARTICIPATION QUALITY:22265}  Affect:  {BHH AFFECT:22266}  Cognitive:  {BHH COGNITIVE:22267}  Insight: {BHH Insight2:20797}  Engagement in Group:  {BHH ENGAGEMENT IN YQIHK:74259}  Modes of Intervention:  {BHH MODES OF INTERVENTION:22269}  Additional Comments:  ***  Scot Dock 01/11/2023, 8:30 PM

## 2023-01-11 NOTE — Group Note (Unsigned)
Date:  01/11/2023 Time:  8:46 PM  Group Topic/Focus:  Wrap-Up Group:   The focus of this group is to help patients review their daily goal of treatment and discuss progress on daily workbooks.     Participation Level:  {BHH PARTICIPATION JXBJY:78295}  Participation Quality:  {BHH PARTICIPATION QUALITY:22265}  Affect:  {BHH AFFECT:22266}  Cognitive:  {BHH COGNITIVE:22267}  Insight: {BHH Insight2:20797}  Engagement in Group:  {BHH ENGAGEMENT IN AOZHY:86578}  Modes of Intervention:  {BHH MODES OF INTERVENTION:22269}  Additional Comments:  ***  Scot Dock 01/11/2023, 8:46 PM

## 2023-01-11 NOTE — Group Note (Unsigned)
Date:  01/11/2023 Time:  9:01 PM  Group Topic/Focus:  Wrap-Up Group:   The focus of this group is to help patients review their daily goal of treatment and discuss progress on daily workbooks.     Participation Level:  {BHH PARTICIPATION HYQMV:78469}  Participation Quality:  {BHH PARTICIPATION QUALITY:22265}  Affect:  {BHH AFFECT:22266}  Cognitive:  {BHH COGNITIVE:22267}  Insight: {BHH Insight2:20797}  Engagement in Group:  {BHH ENGAGEMENT IN GEXBM:84132}  Modes of Intervention:  {BHH MODES OF INTERVENTION:22269}  Additional Comments:  ***  Scot Dock 01/11/2023, 9:01 PM

## 2023-01-11 NOTE — Progress Notes (Signed)
[facility] MD Progress Note  01/11/2023 3:22 PM [name]  MRN:  [id number]  HPI: [name] is a 32 yr old female who presented on 11/8 to [facility] with complaints of SI with a plan to OD, she was admitted to [facility] on 11/14.  PPHx is significant for Schizoaffective Disorder, Bipolar Type, Borderline Personality Disorder, GAD, PTSD, and ASD, and multiple Suicide Attempts, history of Self Injurious Behavior (cutting), and multiple Psychiatric Hospitalizations (last [facility] 1024).   24-hour chart review: Heart rate remains at 116, this seems to be chronic at this point.  She is asymptomatic.  The rest of the vitals are within normal limits.  Patient is compliant with scheduled medications, PRNs for the past 24 hours have been Voltaren and hydroxyzine. Noted by nursing to be anxious, attention seeking, childlike, blaming others and exhibiting some self injurious ideations.  As per nursing, she also verbalized threats of violence towards other people, and verbalized that she was hearing chattering, & spent some time in the quiet room earlier today.  Today's patient assessment: During encounter with patient, she verbalized suicidal ideations, reported not having a plan while here at the hospital, but stated that if we discharged home, she will go lay down in traffic and have a car run over her.  She also presented with HI, to what her parents, stated that she had an intent and a plan.  When asked what her plan was, she stated that if discharged, she will get a knife, go to their home in [place] and stab her brother, her father, and her mother.  She continues to verbalize wanting to go to [facility], stating that she has benefited from being there a few years back, and that the group sessions there will be helpful should she be considered for admission day.  Patient reports that sleep last night was fair, reports a good appetite, reported auditory hallucinations of chatters,  denies visual hallucinations.  Denies medication related side effects, denies any concerns with medications so far, denies side effects. No TD/EPS type symptoms found on assessment, and pt denies any feelings of stiffness. AIMS: 0.  We are continuing all medications as listed below, and continuing hospitalization at this point, since patient is presenting with both SI, and HI, and continues to have plans to act on these should she be discharged.  CSW, is continuing to work with her ACT team, & coordinate discharge planning once symptoms begin to resolve. [facility]  Principal Problem: Schizoaffective disorder, bipolar type (HCC) Diagnosis: Principal Problem:   Schizoaffective disorder, bipolar type (HCC) Active Problems:   Borderline personality disorder (HCC)   Asperger syndrome   PTSD (post-traumatic stress disorder)   Asthma   Self-injurious behavior   Fibromyalgia   Prolonged QT interval  Total Time spent with patient: 20 minutes  Past Psychiatric History:  Schizoaffective Disorder, Bipolar Type, Borderline Personality Disorder, GAD, PTSD, and ASD, and multiple Suicide Attempts, history of Self Injurious Behavior (cutting), and multiple Psychiatric Hospitalizations (last [facility] 1024).   Past Medical History:  Past Medical History:  Diagnosis Date   Acid reflux    Adjustment disorder with mixed anxiety and depressed mood 01/31/2022   Adjustment disorder with mixed disturbance of emotions and conduct 08/03/2019   Anxiety    Asthma    last attack 03/13/15 or 03/14/15   Autism    Bipolar 1 disorder, depressed, severe (HCC) 07/25/2021   Carrier  of fragile X syndrome    Chronic constipation    Depression    Drug-seeking behavior    Essential tremor    Headache    Ineffective individual coping 05/16/2022   Insomnia 01/12/2022   Intentional drug overdose (HCC) 06/05/2022   Neuromuscular disorder (HCC)    Normocytic anemia  06/05/2022   Overdose 07/22/2017   Overdose of acetaminophen 07/2017   and other meds   Overdose, intentional self-harm, initial encounter (HCC) 07/20/2021   Paranoia (HCC) 04/22/2021   Personality disorder (HCC)    Prolonged QT interval    Purposeful non-suicidal drug ingestion (HCC) 06/27/2021   Schizo-affective psychosis (HCC)    Schizoaffective disorder (HCC) 07/29/2022   Schizoaffective disorder, bipolar type (HCC)    Seizures (HCC)    Last seizure December 2017   Skin erythema 04/27/2022   Sleep apnea    Suicidal behavior 07/25/2021   Suicidal ideation    Suicide (HCC) 07/01/2021   Suicide attempt (HCC) 07/04/2021    Past Surgical History:  Procedure Laterality Date   MOUTH SURGERY  2009 or 2010   Family History:  Family History  Problem Relation Age of Onset   Mental illness Father    Asthma Father    PDD Brother    Seizures Brother    Family Psychiatric  History: Mental illness in her father; PDD in her brother; Seizures in her brother.  Social History:  Social History   Substance and Sexual Activity  Alcohol Use No   Alcohol/week: 1.0 standard drink of alcohol   Types: 1 Standard drinks or equivalent per week   Comment: denies at this time     Social History   Substance and Sexual Activity  Drug Use No   Comment: History of cocaine use at age 48 for 4 months    Social History   Socioeconomic History   Marital status: Widowed    Spouse name: Not on file   Number of children: 0   Years of education: Not on file   Highest education level: Not on file  Occupational History   Occupation: disability  Tobacco Use   Smoking status: Former    Types: Cigarettes   Smokeless tobacco: Never   Tobacco comments:    Smoked for 2  years age [age]  Vaping Use   Vaping status: Never Used  Substance and Sexual Activity   Alcohol use: No    Alcohol/week: 1.0 standard drink of alcohol    Types: 1 Standard drinks or equivalent per week    Comment: denies at  this time   Drug use: No    Comment: History of cocaine use at age 36 for 4 months   Sexual activity: Not Currently    Birth control/protection: None  Other Topics Concern   Not on file  Social History Narrative   Marital status: Widowed      Children: daughter      Lives: with boyfriend, in two story home      Employment:  Disability      Tobacco: quit smoking; smoked for two years.      Alcohol ;none      Drugs: none   Has not traveled outside of the country.   Right handed         Patient with hx of Fibromyalgia,Orthostatic Tachycardia,Asthma,Arthritis,Gerd, ASD   Social Determinants of Health   Financial Resource Strain: Not on file  Food Insecurity: No Food Insecurity (12/25/2022)   Hunger Vital Sign    Worried  About Running Out of Food in the Last Year: Never true    Ran Out of Food in the Last Year: Never true  Transportation Needs: No Transportation Needs (12/25/2022)   PRAPARE - Administrator, Civil Service (Medical): No    Lack of Transportation (Non-Medical): No  Physical Activity: Not on file  Stress: Not on file  Social Connections: Not on file   Additional Social History:                         Sleep: Poor  Appetite:  Fair  Current Medications: Current Facility-Administered Medications  Medication Dose Route Frequency Provider Last Rate Last Admin   albuterol (PROVENTIL) (2.5 MG/3ML) 0.083% nebulizer solution 2.5 mg  2.5 mg Nebulization Q6H PRN Weber, Kyra A, NP       alum & mag hydroxide-simeth (MAALOX/MYLANTA) 200-200-20 MG/5ML suspension 30 mL  30 mL Oral Q4H PRN Weber, Bella Kennedy A, NP       bacitracin ointment   Topical BID Starleen Blue, NP   [phone] Application at 01/11/23 0742   diclofenac Sodium (VOLTAREN) 1 % topical gel 4 g  4 g Topical QID PRN Jearld Lesch, NP   4 g at 01/10/23 0744   diphenhydrAMINE (BENADRYL) capsule 50 mg  50 mg Oral TID PRN Phebe Colla A, NP   50 mg at 01/09/23 2145   Or   diphenhydrAMINE  (BENADRYL) injection 50 mg  50 mg Intramuscular TID PRN Weber, Bella Kennedy A, NP   50 mg at 01/08/23 2137   DULoxetine (CYMBALTA) DR capsule 90 mg  90 mg Oral Daily Rex Kras, MD   90 mg at 01/11/23 0743   gabapentin (NEURONTIN) capsule 600 mg  600 mg Oral TID Rex Kras, MD   600 mg at 01/11/23 1311   hydrOXYzine (ATARAX) tablet 25 mg  25 mg Oral TID PRN Starleen Blue, NP   25 mg at 01/10/23 1052   ibuprofen (ADVIL) tablet 600 mg  600 mg Oral Q8H PRN Weber, Kyra A, NP   600 mg at 01/11/23 0231   LORazepam (ATIVAN) tablet 2 mg  2 mg Oral TID PRN Phebe Colla A, NP   2 mg at 01/09/23 2145   Or   LORazepam (ATIVAN) injection 2 mg  2 mg Intramuscular TID PRN Weber, Bella Kennedy A, NP   2 mg at 01/08/23 2136   magnesium hydroxide (MILK OF MAGNESIA) suspension 30 mL  30 mL Oral Daily PRN Weber, Kyra A, NP   30 mL at 01/05/23 0252   melatonin tablet 3 mg  3 mg Oral QHS Taite Baldassari, NP   3 mg at 01/10/23 2006   mometasone-formoterol (DULERA) 200-5 MCG/ACT inhaler 2 puff  2 puff Inhalation BID Ajibola, Ene A, NP   2 puff at 01/11/23 0741   OLANZapine (ZYPREXA) tablet 15 mg  15 mg Oral BID Lauro Franklin, MD   15 mg at 01/11/23 0743   ondansetron (ZOFRAN-ODT) disintegrating tablet 4 mg  4 mg Oral Q8H PRN Weber, Kyra A, NP   4 mg at 01/07/23 0746   pantoprazole (PROTONIX) EC tablet 40 mg  40 mg Oral QAC breakfast Weber, Kyra A, NP   40 mg at 01/11/23 0617   prazosin (MINIPRESS) capsule 1 mg  1 mg Oral QHS Weber, Kyra A, NP   1 mg at 01/10/23 2006   propafenone (RYTHMOL SR) 12 hr capsule 225 mg  225 mg Oral BID Lauro Franklin, MD  225 mg at 01/11/23 5409    Lab Results: No results found for this or any previous visit (from the past 48 hour(s)).  Blood Alcohol level:  Lab Results  Component Value Date   ETH <10 12/18/2022   ETH <10 12/14/2022    Metabolic Disorder Labs: Lab Results  Component Value Date   HGBA1C 5.4 10/22/2022   MPG 108 10/22/2022   MPG 103 04/20/2022   Lab  Results  Component Value Date   PROLACTIN 20.0 12/14/2022   PROLACTIN 66.2 (H) 03/02/2021   Lab Results  Component Value Date   CHOL 188 12/14/2022   TRIG 222 (H) 12/14/2022   HDL 41 12/14/2022   CHOLHDL 4.6 12/14/2022   VLDL 44 (H) 12/14/2022   LDLCALC 103 (H) 12/14/2022   LDLCALC 117 (H) 10/22/2022    Physical Findings: AIMS:  , ,  ,  ,    CIWA:    COWS:     Musculoskeletal: Strength & Muscle Tone: within normal limits Gait & Station: normal Patient leans: N/A  Psychiatric Specialty Exam:  Presentation  General Appearance:  Casual  Eye Contact: Fair  Speech: Clear and Coherent  Speech Volume: Decreased  Handedness: Right   Mood and Affect  Mood: Anxious; Depressed; Irritable  Affect: Congruent   Thought Process  Thought Processes: Coherent  Descriptions of Associations:Intact  Orientation:Partial  Thought Content:Illogical  History of Schizophrenia/Schizoaffective disorder:Yes  Duration of Psychotic Symptoms:Greater than six months  Hallucinations:Hallucinations: Auditory   Ideas of Reference:Paranoia; Percusatory  Suicidal Thoughts:Suicidal Thoughts: Yes, Active SI Active Intent and/or Plan: With Plan  Homicidal Thoughts:Homicidal Thoughts: Yes, Active HI Active Intent and/or Plan: With Plan; With Intent   Sensorium  Memory: Immediate Fair  Judgment: Poor  Insight: Poor   Executive Functions  Concentration: Poor  Attention Span: Poor  Recall: Poor  Fund of Knowledge: Poor  Language: Fair   Psychomotor Activity  Psychomotor Activity: Psychomotor Activity: Normal   Assets  Assets: Resilience; Desire for Improvement   Sleep  Sleep: Sleep: Fair    Physical Exam: Physical Exam Vitals and nursing note reviewed.  Constitutional:      Appearance: Normal appearance.  HENT:     Head: Normocephalic and atraumatic.  Eyes:     Extraocular Movements: Extraocular movements intact.  Pulmonary:      Effort: Pulmonary effort is normal.  Musculoskeletal:        General: Normal range of motion.     Cervical back: Normal range of motion.  Neurological:     General: No focal deficit present.     Mental Status: She is alert and oriented to person, place, and time.  Psychiatric:        Mood and Affect: Mood is depressed. Affect is flat.        Thought Content: Thought content is paranoid.    Review of Systems  Constitutional:  Negative for fever.  Musculoskeletal:  Negative for myalgias.  Skin:        Healing scratches/abrasions   Neurological:  Negative for tremors.  Psychiatric/Behavioral:  Positive for hallucinations and suicidal ideas. Negative for memory loss and substance abuse. The patient is nervous/anxious and has insomnia.   All other systems reviewed and are negative.  Blood pressure 138/71, pulse (!) 116, temperature 97.8 F (36.6 C), temperature source Oral, resp. rate 16, height 5\' 4"  (1.626 m), weight 112.5 kg, SpO2 99%. Body mass index is 42.57 kg/m.  Treatment Plan Summary: Daily contact with patient to assess and evaluate symptoms and  progress in treatment and Medication management   Schizoaffective Disorder, Bipolar Type: -Continue Zyprexa 15 mg BID for mood stability and psychosis -Continue Cymbalta 90 mg daily for depression and chronic pain -Continue Gabapentin 600 mg TID for anxiety and chronic pain -Continue Prazosin 1 mg QHS for nightmares -Continue Agitation Protocol: Ativan/Benadryl   Asthma: -Continue Dulera 2 puff BID -Continue Albuterol Nebuliation 2.5 mg q6 PRN wheezing   -Continue Propafenone 225 mg BID -Continue Protonix 40 mg daily -Continue Melatonin 3 mg QHS for insomnia -Continue PRN's: Ibuprofen, Maalox, Atarax, Milk of Magnesia, Trazodone   Repeat EKG on 11/20 showed Sinus Tach (108) with Qtc: 487, Repeat EKG 12/2, and completed with Qtc lower, but remaining prolonged at 481.  Starleen Blue, NP 01/11/2023, 3:22 PM Patient ID: [name], female   DOB: 13-Nov-1990, 32 y.o.   MRN: [id number]

## 2023-01-11 NOTE — Progress Notes (Signed)
   01/11/23 1000  Psych Admission Type (Psych Patients Only)  Admission Status Voluntary  Psychosocial Assessment  Patient Complaints Anxiety  Eye Contact Fair  Facial Expression Anxious  Affect Anxious;Irritable  Speech Logical/coherent  Interaction Attention-seeking;Childlike  Motor Activity Fidgety  Appearance/Hygiene Unremarkable  Behavior Characteristics Agressive verbally;Agitated;Anxious;Irritable  Mood Anxious;Labile  Thought Process  Coherency Circumstantial  Content Blaming others  Delusions None reported or observed;Persecutory;Paranoid  Perception WDL;Hallucinations  Hallucination None reported or observed;Auditory  Judgment Poor  Confusion None  Danger to Self  Current suicidal ideation? Denies  Danger to Others Abnormal  Harmful Behavior to others Threats of violence towards other people observed or expressed    Pt initially stated AH was minimal, she heard only chatter. Later in the morning pt observed yelling from room " Just kill me you know you are going to anyway I'm going to kill you like you are killing me!" Pt stated she did not feel safe in room, nurse allowed pt to rest in quiet room until  she was calm. Pt declined prn medication for anxiety. Pt expresses wanting to further discuss discharge plans with her treatment team. Pt has dry skin on heels and 1 dime sized area where skin is broken. Pt applied vaseline and covered with telfa. Will monitor.

## 2023-01-11 NOTE — Progress Notes (Incomplete)
Pt endorses suicidal thoughts at this time with a plan to lay down in traffic. Pt states she is upset about being accused of raping her daughter. Pt states  "maybe its my autism Ive just always taken things too far." Nurse and pt discussed coping skills and distraction techniques available to her.

## 2023-01-11 NOTE — Group Note (Signed)
Recreation Therapy Group Note   Group Topic:Goal Setting  Group Date: 01/11/2023 Start Time: 1005 End Time: 1030 Facilitators: Parry Po-McCall, LRT,CTRS Location: [address] Dayroom   Group Topic: Goal Setting, Future Planning  Goal Area(s) Addresses:  Patient will identify at least 1 long-term goal for their life.  Patient will reflect on short-term steps they will need to reach their goal(s).  Intervention: Discussion  Group Description: LRT and patients discussed what goals are and how they are used as a guide through life. Patients discussed some of the goals they want to accomplish as the year comes to an end and the coming year. Patients were to also identify any action steps they would use to reach those goals.    Education:  Forward Thinking, Personal Purpose, Goal Setting, Discharge Planning  Education Outcome: Acknowledges Education/In Group Clarification Provided/Needs Additional Education   Affect/Mood: N/A   Participation Level: Did not attend    Clinical Observations/Individualized Feedback:     Plan: Continue to engage patient in RT group sessions 2-3x/week.   Tameko Halder-McCall, LRT,CTRS 01/11/2023 12:07 PM

## 2023-01-11 NOTE — Group Note (Unsigned)
Date:  01/11/2023 Time:  8:38 PM  Group Topic/Focus:  Wrap-Up Group:   The focus of this group is to help patients review their daily goal of treatment and discuss progress on daily workbooks.     Participation Level:  {BHH PARTICIPATION ZOXWR:60454}  Participation Quality:  {BHH PARTICIPATION QUALITY:22265}  Affect:  {BHH AFFECT:22266}  Cognitive:  {BHH COGNITIVE:22267}  Insight: {BHH Insight2:20797}  Engagement in Group:  {BHH ENGAGEMENT IN UJWJX:91478}  Modes of Intervention:  {BHH MODES OF INTERVENTION:22269}  Additional Comments:  ***  Scot Dock 01/11/2023, 8:38 PM

## 2023-01-12 DIAGNOSIS — F250 Schizoaffective disorder, bipolar type: Secondary | ICD-10-CM | POA: Diagnosis not present

## 2023-01-12 MED ORDER — WHITE PETROLATUM EX OINT
TOPICAL_OINTMENT | CUTANEOUS | Status: AC
  Filled 2023-01-12: qty 5

## 2023-01-12 NOTE — Progress Notes (Signed)
   01/12/23 0557  15 Minute Checks  Location Bedroom  Visual Appearance Calm  Behavior Sleeping  Sleep (Behavioral Health Patients Only)  Calculate sleep? (Click Yes once per 24 hr at 0600 safety check) Yes  Documented sleep last 24 hours 9.5

## 2023-01-12 NOTE — Plan of Care (Signed)
  Problem: Activity: Goal: Interest or engagement in activities will improve Outcome: Progressing

## 2023-01-12 NOTE — Plan of Care (Signed)
  Problem: Education: Goal: Knowledge of [facility] Education information/materials will improve Outcome: Progressing Goal: Emotional status will improve Outcome: Progressing Goal: Mental status will improve Outcome: Progressing Goal: Verbalization of understanding the information provided will improve Outcome: Progressing   Problem: Activity: Goal: Interest or engagement in activities will improve Outcome: Progressing Goal: Sleeping patterns will improve Outcome: Progressing   Problem: Coping: Goal: Ability to verbalize frustrations and anger appropriately will improve Outcome: Progressing Goal: Ability to demonstrate self-control will improve Outcome: Progressing   Problem: Health Behavior/Discharge Planning: Goal: Identification of resources available to assist in meeting health care needs will improve Outcome: Progressing Goal: Compliance with treatment plan for underlying cause of condition will improve Outcome: Progressing   Problem: Physical Regulation: Goal: Ability to maintain clinical measurements within normal limits will improve Outcome: Progressing   Problem: Safety: Goal: Periods of time without injury will increase Outcome: Progressing   Problem: Education: Goal: Ability to incorporate positive changes in behavior to improve self-esteem will improve Outcome: Progressing   Problem: Health Behavior/Discharge Planning: Goal: Ability to identify and utilize available resources and services will improve Outcome: Progressing Goal: Ability to remain free from injury will improve Outcome: Progressing   Problem: Self-Concept: Goal: Will verbalize positive feelings about self Outcome: Progressing   Problem: Skin Integrity: Goal: Demonstration of wound healing without infection will improve Outcome: Progressing   Problem: Activity: Goal: Will identify at least one activity in which they can participate Outcome: Progressing   Problem:  Coping: Goal: Ability to identify and develop effective coping behavior will improve Outcome: Progressing Goal: Ability to interact with others will improve Outcome: Progressing Goal: Demonstration of participation in decision-making regarding own care will improve Outcome: Progressing Goal: Ability to use eye contact when communicating with others will improve Outcome: Progressing   Problem: Health Behavior/Discharge Planning: Goal: Identification of resources available to assist in meeting health care needs will improve Outcome: Progressing   Problem: Self-Concept: Goal: Will verbalize positive feelings about self Outcome: Progressing   Problem: Safety: Goal: Violent Restraint(s) Outcome: Progressing   Problem: Safety: Goal: Violent Restraint(s) Outcome: Progressing

## 2023-01-12 NOTE — Progress Notes (Signed)
Pt observed throwing a water bottle out of her room. Pt yelling " Your trying to kill me!" Pt endorse AH. Pt perseverating about her amount of losses in her life. Pt noted to have written negative statements all over her wall in her room. Pt received vistaril prn.

## 2023-01-12 NOTE — Progress Notes (Signed)
Pt observed to be increasingly agitated and anxious. Pt was anxious and irritated about not having anything to self-ham with. Provided pt with active listening and advised pt to utilize coping skills. Pt was given PO agitation protocol per MAR.

## 2023-01-12 NOTE — Progress Notes (Signed)
   01/11/23 2000  Psych Admission Type (Psych Patients Only)  Admission Status Voluntary  Psychosocial Assessment  Patient Complaints Anxiety  Eye Contact Fair  Facial Expression Anxious  Affect Anxious  Speech Logical/coherent  Interaction Attention-seeking;Childlike;Manipulative  Motor Activity Fidgety  Appearance/Hygiene Unremarkable  Behavior Characteristics Anxious  Mood Anxious;Labile  Thought Process  Coherency Circumstantial  Content Blaming others  Delusions None reported or observed  Perception WDL  Hallucination None reported or observed  Judgment Poor  Confusion None  Danger to Self  Current suicidal ideation? Active  Description of Suicide Plan cut self with anything avaliable  Self-Injurious Behavior Some self-injurious ideation observed or expressed.  No lethal plan expressed   Agreement Not to Harm Self Yes  Description of Agreement verbal   Pt's room was searched for any potential or sharp objects that may be used to harm herself. Pt verbally contracted for safety.

## 2023-01-12 NOTE — Group Note (Signed)
Date:  01/12/2023 Time:  8:36 PM  Group Topic/Focus:  Wrap-Up Group:   The focus of this group is to help patients review their daily goal of treatment and discuss progress on daily workbooks.    Participation Level:  Active  Participation Quality:  Appropriate  Affect:  Appropriate  Cognitive:  Appropriate  Insight: Appropriate  Engagement in Group:  Engaged  Modes of Intervention:  Education and Exploration  Additional Comments:  Patient attended and participated in group tonight.  She reports that she like that she [name] a caring,giving and good person.she is a honest person and a good mother.  [name] [facility] 01/12/2023, 8:36 PM

## 2023-01-12 NOTE — Progress Notes (Signed)
[facility] MD Progress Note  01/12/2023 11:06 AM [name]  MRN:  [id number]  HPI: [name] is a 32 yr old female who presented on 11/8 to [facility] with complaints of SI with a plan to OD, she was admitted to [facility] on 11/14.  PPHx is significant for Schizoaffective Disorder, Bipolar Type, Borderline Personality Disorder, GAD, PTSD, and ASD, and multiple Suicide Attempts, history of Self Injurious Behavior (cutting), and multiple Psychiatric Hospitalizations (last [facility] 1024).   24-hour chart review:  Heart rate today appears to be much better at 103, BP with slightly low DBP at 52.  Patient is remaining compliant with scheduled medications, patient was agitated and anxious last night, verbalized that she did not have anything to use to harm herself, and was given agitation protocol medications as per nursing reports and documentation.  She slept for 9.5 hours overnight per nursing flow sheets.  Assessment note for today: During today's assessment, patient is continuing to verbalize SI, states that she will get run over by a car if discharged, unable to contract for safety outside of the hospital at this time.  Continues to verbalize HI towards her parents, states that she has a plan and intent to do so if discharged.  Currently denies auditory hallucinations, denies visual hallucinations, continues to present with delusions of persecution; states that her family members are out to harm her, writer attempted to engage her in conversations regarding why she is angry at her family, but she states "I cannot get into it right now."  Patient reports that she is eating well, tolerating medications well, denies medication related side effects.  Patient's current presentation, does not warrant any medication changes or adjustments, as historically, medications have not been effective for her highly impulsive nature, or suicidal ideations.  Patient is also at this time presenting a high risk of  danger to self due to past overdoses, and it would therefore not be safe to discharge her at this time since she is still verbalizing SI and HI with plans for both.  Patient is currently on the waiting list for [facility], which is where she has verbalized preferring to go, states that the groups they are helpful as per her past experiences with going to that facility.  We will keep her on a wait list for [facility] at this time, and continue to monitor.  Patient's uncle had intended to take patient home, and CSW was in communication with him, but he stopped answering his phone from staff here at the hospital, and also stopped answering the phone for patient, which rendered patient even more depressed.  We will revisit discharge planning as SI/HI resolve. Continuing all medications as listed below with no changes at this time. No TD/EPS type symptoms found on assessment, and pt denies any feelings of stiffness. AIMS: 0.   Principal Problem: Schizoaffective disorder, bipolar type (HCC) Diagnosis: Principal Problem:   Schizoaffective disorder, bipolar type (HCC) Active Problems:   Borderline personality disorder (HCC)   Asperger syndrome   PTSD (post-traumatic stress disorder)   Asthma   Self-injurious behavior   Fibromyalgia   Prolonged QT interval  Total Time spent with patient: 20 minutes  Past Psychiatric History:  Schizoaffective Disorder, Bipolar Type, Borderline Personality Disorder, GAD, PTSD, and ASD, and multiple Suicide Attempts, history of Self Injurious Behavior (cutting), and multiple Psychiatric Hospitalizations (last [facility] 1024).   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    Chronic constipation    Depression    Drug-seeking behavior    Essential tremor    Headache    Ineffective individual coping 05/16/2022   Insomnia 01/12/2022   Intentional drug overdose (HCC) 06/05/2022   Neuromuscular disorder (HCC)    Normocytic anemia 06/05/2022   Overdose 07/22/2017   Overdose of acetaminophen 07/2017   and other meds   Overdose, intentional self-harm, initial encounter (HCC) 07/20/2021   Paranoia (HCC) 04/22/2021   Personality disorder (HCC)    Prolonged QT interval    Purposeful non-suicidal drug ingestion (HCC) 06/27/2021   Schizo-affective psychosis (HCC)    Schizoaffective disorder (HCC) 07/29/2022   Schizoaffective disorder, bipolar type (HCC)    Seizures (HCC)    Last seizure December 2017   Skin erythema 04/27/2022   Sleep apnea    Suicidal behavior 07/25/2021   Suicidal ideation    Suicide (HCC) 07/01/2021   Suicide attempt (HCC) 07/04/2021    Past Surgical History:  Procedure Laterality Date   MOUTH SURGERY  2009 or 2010   Family History:  Family History  Problem Relation Age of Onset   Mental illness Father    Asthma Father    PDD Brother    Seizures Brother    Family Psychiatric  History: Mental illness in her father; PDD in her brother; Seizures in her brother.  Social History:  Social History   Substance and Sexual Activity  Alcohol Use No   Alcohol/week: 1.0 standard drink of alcohol   Types: 1 Standard drinks or equivalent per week   Comment: denies at this time     Social History   Substance and Sexual Activity  Drug Use No   Comment: History of cocaine use at age 40 for 4 months    Social History   Socioeconomic History   Marital status: Widowed    Spouse name: Not on file   Number of children: 0   Years of education: Not on file   Highest education level: Not on file  Occupational History   Occupation: disability  Tobacco Use   Smoking status:  Former    Types: Cigarettes   Smokeless tobacco: Never   Tobacco comments:    Smoked for 2  years age [age]  Vaping Use   Vaping status: Never Used  Substance and Sexual Activity   Alcohol use: No    Alcohol/week: 1.0 standard drink of alcohol    Types: 1 Standard drinks or equivalent per week    Comment: denies at this time   Drug use: No    Comment: History of cocaine use at age 40 for 4 months   Sexual activity: Not Currently    Birth control/protection: None  Other Topics Concern   Not on file  Social History Narrative   Marital status: Widowed      Children: daughter      Lives: with boyfriend, in two story home      Employment:  Disability      Tobacco: quit smoking; smoked for two years.      Alcohol ;none      Drugs: none   Has not traveled outside  of the country.   Right handed         Patient with hx of Fibromyalgia,Orthostatic Tachycardia,Asthma,Arthritis,Gerd, ASD   Social Determinants of Health   Financial Resource Strain: Not on file  Food Insecurity: No Food Insecurity (12/25/2022)   Hunger Vital Sign    Worried About Running Out of Food in the Last Year: Never true    Ran Out of Food in the Last Year: Never true  Transportation Needs: No Transportation Needs (12/25/2022)   PRAPARE - Administrator, Civil Service (Medical): No    Lack of Transportation (Non-Medical): No  Physical Activity: Not on file  Stress: Not on file  Social Connections: Not on file   Additional Social History:                         Sleep: Poor  Appetite:  Fair  Current Medications: Current Facility-Administered Medications  Medication Dose Route Frequency Provider Last Rate Last Admin   albuterol (PROVENTIL) (2.5 MG/3ML) 0.083% nebulizer solution 2.5 mg  2.5 mg Nebulization Q6H PRN Weber, Kyra A, NP       alum & mag hydroxide-simeth (MAALOX/MYLANTA) 200-200-20 MG/5ML suspension 30 mL  30 mL Oral Q4H PRN Weber, Kyra A, NP       bacitracin ointment    Topical BID Starleen Blue, NP   [id number] Application at 01/12/23 0819   diclofenac Sodium (VOLTAREN) 1 % topical gel 4 g  4 g Topical QID PRN Jearld Lesch, NP   4 g at 01/11/23 2115   diphenhydrAMINE (BENADRYL) capsule 50 mg  50 mg Oral TID PRN Phebe Colla A, NP   50 mg at 01/11/23 2044   Or   diphenhydrAMINE (BENADRYL) injection 50 mg  50 mg Intramuscular TID PRN Weber, Bella Kennedy A, NP   50 mg at 01/08/23 2137   DULoxetine (CYMBALTA) DR capsule 90 mg  90 mg Oral Daily Rex Kras, MD   90 mg at 01/12/23 7846   gabapentin (NEURONTIN) capsule 600 mg  600 mg Oral TID Rex Kras, MD   600 mg at 01/12/23 9629   hydrOXYzine (ATARAX) tablet 25 mg  25 mg Oral TID PRN Starleen Blue, NP   25 mg at 01/12/23 1012   ibuprofen (ADVIL) tablet 600 mg  600 mg Oral Q8H PRN Weber, Kyra A, NP   600 mg at 01/11/23 1647   LORazepam (ATIVAN) tablet 2 mg  2 mg Oral TID PRN Phebe Colla A, NP   2 mg at 01/11/23 2044   Or   LORazepam (ATIVAN) injection 2 mg  2 mg Intramuscular TID PRN Weber, Bella Kennedy A, NP   2 mg at 01/08/23 2136   magnesium hydroxide (MILK OF MAGNESIA) suspension 30 mL  30 mL Oral Daily PRN Weber, Kyra A, NP   30 mL at 01/05/23 0252   melatonin tablet 3 mg  3 mg Oral QHS Annalysia Willenbring, NP   3 mg at 01/11/23 2009   mometasone-formoterol (DULERA) 200-5 MCG/ACT inhaler 2 puff  2 puff Inhalation BID Ajibola, Ene A, NP   2 puff at 01/12/23 0811   OLANZapine (ZYPREXA) tablet 15 mg  15 mg Oral BID Lauro Franklin, MD   15 mg at 01/12/23 0818   ondansetron (ZOFRAN-ODT) disintegrating tablet 4 mg  4 mg Oral Q8H PRN Weber, Kyra A, NP   4 mg at 01/11/23 1939   pantoprazole (PROTONIX) EC tablet 40 mg  40 mg Oral QAC breakfast Weber,  Kyra A, NP   40 mg at 01/12/23 0641   prazosin (MINIPRESS) capsule 1 mg  1 mg Oral QHS Weber, Kyra A, NP   1 mg at 01/11/23 2009   propafenone (RYTHMOL SR) 12 hr capsule 225 mg  225 mg Oral BID Lauro Franklin, MD   225 mg at 01/12/23 4098    Lab Results: No results  found for this or any previous visit (from the past 48 hour(s)).  Blood Alcohol level:  Lab Results  Component Value Date   ETH <10 12/18/2022   ETH <10 12/14/2022    Metabolic Disorder Labs: Lab Results  Component Value Date   HGBA1C 5.4 10/22/2022   MPG 108 10/22/2022   MPG 103 04/20/2022   Lab Results  Component Value Date   PROLACTIN 20.0 12/14/2022   PROLACTIN 66.2 (H) 03/02/2021   Lab Results  Component Value Date   CHOL 188 12/14/2022   TRIG 222 (H) 12/14/2022   HDL 41 12/14/2022   CHOLHDL 4.6 12/14/2022   VLDL 44 (H) 12/14/2022   LDLCALC 103 (H) 12/14/2022   LDLCALC 117 (H) 10/22/2022    Physical Findings: AIMS:  , ,  ,  ,    CIWA:    COWS:     Musculoskeletal: Strength & Muscle Tone: within normal limits Gait & Station: normal Patient leans: N/A  Psychiatric Specialty Exam:  Presentation  General Appearance:  Appropriate for Environment; Casual; Fairly Groomed  Eye Contact: Fair  Speech: Clear and Coherent  Speech Volume: Normal  Handedness: Right   Mood and Affect  Mood: Depressed; Anxious  Affect: Congruent   Thought Process  Thought Processes: Coherent  Descriptions of Associations:Intact  Orientation:Partial  Thought Content:Logical  History of Schizophrenia/Schizoaffective disorder:Yes  Duration of Psychotic Symptoms:Greater than six months  Hallucinations:Hallucinations: None   Ideas of Reference:None  Suicidal Thoughts:Suicidal Thoughts: Yes, Active SI Active Intent and/or Plan: With Plan; With Intent  Homicidal Thoughts:Homicidal Thoughts: Yes, Active HI Active Intent and/or Plan: With Plan; With Intent   Sensorium  Memory: Recent Fair  Judgment: Poor  Insight: Poor   Executive Functions  Concentration: Poor  Attention Span: Poor  Recall: Fair  Fund of Knowledge: Poor  Language: Fair   Psychomotor Activity  Psychomotor Activity: Psychomotor Activity: Normal   Assets   Assets: Desire for Improvement; Resilience   Sleep  Sleep: Sleep: Good    Physical Exam: Physical Exam Vitals and nursing note reviewed.  Constitutional:      Appearance: Normal appearance.  HENT:     Head: Normocephalic and atraumatic.  Eyes:     Extraocular Movements: Extraocular movements intact.  Pulmonary:     Effort: Pulmonary effort is normal.  Musculoskeletal:        General: Normal range of motion.     Cervical back: Normal range of motion.  Neurological:     General: No focal deficit present.     Mental Status: She is alert and oriented to person, place, and time.  Psychiatric:        Mood and Affect: Mood is depressed. Affect is flat.        Thought Content: Thought content is paranoid.    Review of Systems  Constitutional:  Negative for fever.  Musculoskeletal:  Negative for myalgias.  Skin:        Healing scratches/abrasions   Neurological:  Negative for tremors.  Psychiatric/Behavioral:  Positive for hallucinations and suicidal ideas. Negative for memory loss and substance abuse. The patient is nervous/anxious and  has insomnia.   All other systems reviewed and are negative.  Blood pressure (!) 120/52, pulse (!) 103, temperature 97.8 F (36.6 C), temperature source Oral, resp. rate 16, height 5\' 4"  (1.626 m), weight 112.5 kg, SpO2 99%. Body mass index is 42.57 kg/m.  Treatment Plan Summary: Daily contact with patient to assess and evaluate symptoms and progress in treatment and Medication management   Schizoaffective Disorder, Bipolar Type: -Continue Zyprexa 15 mg BID for mood stability and psychosis -Continue Cymbalta 90 mg daily for depression and chronic pain -Continue Gabapentin 600 mg TID for anxiety and chronic pain -Continue Prazosin 1 mg QHS for nightmares -Continue Agitation Protocol: Ativan/Benadryl   Asthma: -Continue Dulera 2 puff BID -Continue Albuterol Nebuliation 2.5 mg q6 PRN wheezing   -Continue Propafenone 225 mg  BID -Continue Protonix 40 mg daily -Continue Melatonin 3 mg QHS for insomnia -Continue PRN's: Ibuprofen, Maalox, Atarax, Milk of Magnesia, Trazodone   Repeat EKG on 11/20 showed Sinus Tach (108) with Qtc: 487, Repeat EKG 12/2, and completed with Qtc lower, but remaining prolonged at 481.  Starleen Blue, NP 01/12/2023, 11:06 AM Patient ID: [name], female   DOB: 04/12/1990, 32 y.o.   MRN: [id number] Patient ID: [name], female   DOB: May 29, 1990, 32 y.o.   MRN: [id number]

## 2023-01-13 DIAGNOSIS — F250 Schizoaffective disorder, bipolar type: Secondary | ICD-10-CM | POA: Diagnosis not present

## 2023-01-13 LAB — SARS CORONAVIRUS 2 BY RT PCR: SARS Coronavirus 2 by RT PCR: NEGATIVE

## 2023-01-13 MED ORDER — MENTHOL 3 MG MT LOZG
1.0000 | LOZENGE | OROMUCOSAL | Status: DC | PRN
  Administered 2023-01-13 (×3): 3 mg via ORAL
  Filled 2023-01-13: qty 9

## 2023-01-13 NOTE — Progress Notes (Signed)
Pt c/o sore throat, cough, congestion. Nurse in patients room to swab pt for respiratory panel, pt noted to have written negative statements on her walls and bedsheets. Pt also has flooded room and water is seeping out of bathroom in to room. Pt also noted to have smeared feces on her bathroom walls. Pt endorses auditory hallucinations at this time.

## 2023-01-13 NOTE — Progress Notes (Signed)
   01/13/23 2200  Psych Admission Type (Psych Patients Only)  Admission Status Voluntary  Psychosocial Assessment  Patient Complaints Anxiety  Eye Contact Brief  Facial Expression Anxious  Affect Anxious  Speech Logical/coherent  Interaction Childlike;Attention-seeking  Motor Activity Fidgety  Appearance/Hygiene Unremarkable  Behavior Characteristics Anxious  Mood Anxious;Labile  Thought Process  Coherency Circumstantial  Content Blaming others  Delusions None reported or observed  Perception WDL  Hallucination None reported or observed  Judgment Poor  Confusion None  Danger to Self  Current suicidal ideation? Passive  Description of Suicide Plan No Plan  Self-Injurious Behavior Some self-injurious ideation observed or expressed.  No lethal plan expressed   Agreement Not to Harm Self Yes  Description of Agreement Verbal

## 2023-01-13 NOTE — Progress Notes (Signed)
   01/12/23 2025  Psych Admission Type (Psych Patients Only)  Admission Status Voluntary  Psychosocial Assessment  Patient Complaints Anxiety  Eye Contact Fair  Facial Expression Anxious  Affect Anxious  Speech Logical/coherent  Interaction Attention-seeking;Childlike;Manipulative  Motor Activity Fidgety  Appearance/Hygiene Unremarkable  Behavior Characteristics Anxious  Mood Anxious;Labile  Thought Process  Coherency Circumstantial  Content Blaming others  Delusions None reported or observed  Perception WDL  Hallucination None reported or observed  Judgment Poor  Confusion None  Danger to Self  Current suicidal ideation? Active  Description of Suicide Plan No plan  Self-Injurious Behavior Some self-injurious ideation observed or expressed.  No lethal plan expressed   Agreement Not to Harm Self Yes  Description of Agreement verbal

## 2023-01-13 NOTE — [facility] Group Notes (Signed)
Adult Psychoeducational Group Note  Date:  01/13/2023 Time:  8:41 PM  Group Topic/Focus:  Wrap-Up Group:   The focus of this group is to help patients review their daily goal of treatment and discuss progress on daily workbooks.  Participation Level:  Active  Participation Quality:  Appropriate  Affect:  Appropriate  Cognitive:  Appropriate  Insight: Appropriate  Engagement in Group:  Engaged  Modes of Intervention:  Discussion  Additional Comments:   Pt states that she had a good day. Pt states that that she is ready [facility] once [facility]. Pt endorsed anxiety and depression at a 4, SI and HI towards her family. Pt endorsed pain in her foot and states her nurse is going to wrap it.  Vevelyn Pat 01/13/2023, 8:41 PM

## 2023-01-13 NOTE — Plan of Care (Signed)
  Problem: Activity: Goal: Interest or engagement in activities will improve Outcome: Progressing Goal: Sleeping patterns will improve Outcome: Progressing   

## 2023-01-13 NOTE — Plan of Care (Signed)
  Problem: Education: Goal: Knowledge of [facility] Education information/materials will improve Outcome: Progressing Goal: Emotional status will improve Outcome: Progressing Goal: Mental status will improve Outcome: Progressing Goal: Verbalization of understanding the information provided will improve Outcome: Progressing   Problem: Activity: Goal: Interest or engagement in activities will improve Outcome: Progressing Goal: Sleeping patterns will improve Outcome: Progressing   Problem: Coping: Goal: Ability to verbalize frustrations and anger appropriately will improve Outcome: Progressing Goal: Ability to demonstrate self-control will improve Outcome: Progressing   Problem: Health Behavior/Discharge Planning: Goal: Identification of resources available to assist in meeting health care needs will improve Outcome: Progressing Goal: Compliance with treatment plan for underlying cause of condition will improve Outcome: Progressing   Problem: Physical Regulation: Goal: Ability to maintain clinical measurements within normal limits will improve Outcome: Progressing   Problem: Safety: Goal: Periods of time without injury will increase Outcome: Progressing   Problem: Education: Goal: Ability to incorporate positive changes in behavior to improve self-esteem will improve Outcome: Progressing   Problem: Health Behavior/Discharge Planning: Goal: Ability to identify and utilize available resources and services will improve Outcome: Progressing Goal: Ability to remain free from injury will improve Outcome: Progressing   Problem: Self-Concept: Goal: Will verbalize positive feelings about self Outcome: Progressing   Problem: Skin Integrity: Goal: Demonstration of wound healing without infection will improve Outcome: Progressing   Problem: Activity: Goal: Will identify at least one activity in which they can participate Outcome: Progressing   Problem:  Coping: Goal: Ability to identify and develop effective coping behavior will improve Outcome: Progressing Goal: Ability to interact with others will improve Outcome: Progressing Goal: Demonstration of participation in decision-making regarding own care will improve Outcome: Progressing Goal: Ability to use eye contact when communicating with others will improve Outcome: Progressing   Problem: Health Behavior/Discharge Planning: Goal: Identification of resources available to assist in meeting health care needs will improve Outcome: Progressing   Problem: Self-Concept: Goal: Will verbalize positive feelings about self Outcome: Progressing   Problem: Safety: Goal: Violent Restraint(s) Outcome: Progressing   Problem: Safety: Goal: Violent Restraint(s) Outcome: Progressing

## 2023-01-13 NOTE — Plan of Care (Signed)
  Problem: Education: Goal: Knowledge of [facility] Education information/materials will improve Outcome: Progressing Goal: Emotional status will improve Outcome: Progressing Goal: Mental status will improve Outcome: Progressing Goal: Verbalization of understanding the information provided will improve Outcome: Progressing   Problem: Activity: Goal: Interest or engagement in activities will improve Outcome: Progressing Goal: Sleeping patterns will improve Outcome: Progressing   

## 2023-01-13 NOTE — Progress Notes (Signed)
   01/13/23 5409  15 Minute Checks  Location Bedroom  Visual Appearance Calm  Behavior Sleeping  Sleep (Behavioral Health Patients Only)  Calculate sleep? (Click Yes once per 24 hr at 0600 safety check) Yes  Documented sleep last 24 hours 7.5

## 2023-01-13 NOTE — [facility] Group Notes (Signed)
Adult Psychoeducational Group Note  Date:  01/13/2023 Time:  9:57 AM  Group Topic/Focus:  Goals Group:   The focus of this group is to help patients establish daily goals to achieve during treatment and discuss how the patient can incorporate goal setting into their daily lives to aide in recovery. Orientation:   The focus of this group is to educate the patient on the purpose and policies of crisis stabilization and provide a format to answer questions about their admission.  The group details unit policies and expectations of patients while admitted.  Participation Level:  Active  Participation Quality:  Appropriate  Affect:  Appropriate  Cognitive:  Appropriate  Insight: Appropriate  Engagement in Group:  Engaged  Modes of Intervention:  Education  Additional Comments:  Pt attended the goals group and remained appropriate and engaged throughout the duration of the group.   Sheran Lawless 01/13/2023, 9:57 AM

## 2023-01-13 NOTE — Progress Notes (Signed)
[facility] MD Progress Note  01/13/2023 2:20 PM Lovinia Wiltse  MRN:  [id number]  HPI: Lesle Campton is a 32 yr old female who presented on 11/8 to [facility] with complaints of SI with a plan to OD, she was admitted to [facility] on 11/14.  PPHx is significant for Schizoaffective Disorder, Bipolar Type, Borderline Personality Disorder, GAD, PTSD, and ASD, and multiple Suicide Attempts, history of Self Injurious Behavior (cutting), and multiple Psychiatric Hospitalizations (last [facility] 1024).   24-hour chart review:  Heart rate today appears remains slightly elevated, but much better than in the past week. It was 104 earlier today morning, and 106 when rechecked. BP with slightly elevated DBP at 98 earlier today morning.  Patient is remaining compliant with scheduled medications, and remains intermittently agitated and continues to intermittently exhibit aggressive and behavioral.  Flooded her room, smeared feces on the walls when sent to her room to wait for the results of respiratory testing.  She slept for 7.5 hours overnight per nursing flow sheets.  Assessment note for today: Patient continues to endorse SI with a plan to run in traffic if discharged.  She is verbalizing HI towards her parents with a plan to stab them if discharged.  Behaviors on the unit have been concerning, as she has been aggressive towards staff, towards property, and CSW has placed referrals to [facility] which patient is preferring to go to, but she is continuing to be on the wait list at this time.  Patient continues to state that the groups there would be beneficial for her, continues to verbalize that she does not feel safe being discharged to her home at this time.  Family stopped answering calls regarding taking patient to reside with them for a while, and she continues to require inpatient hospitalization at this time as she is not contracting for safety at this time outside of the hospital environment.   We will need to coordinate with patient's ACT team to see if we can discharge her with in-home services.  Unsure if this is a possibility, but will discuss this during morning her treatment huddle.   She reports a good sleep quality, reports that her energy today is low, concentration poor, reports having racing thoughts.  Reported earlier today that she had a cough, runny nose, sore throat, which warranted respiratory panels being ordered, and for patient to stay in her room pending the results.  During that time, patient flooded her room more water, and smeared feces on her walls.  She denies AVH today.  Endorses paranoia, states that her parents are out to hurt her.  Medication adjustments will not really change anything at this point, because symptoms the patient is exhibiting at behavioral at this point, but then, she is highly impulsive, which is also a concern. Continuing all medications as listed below with no changes at this time. No TD/EPS type symptoms found on assessment, and pt denies any feelings of stiffness. AIMS: 0.   Principal Problem: Schizoaffective disorder, bipolar type (HCC) Diagnosis: Principal Problem:   Schizoaffective disorder, bipolar type (HCC) Active Problems:   Borderline personality disorder (HCC)   Asperger syndrome   PTSD (post-traumatic stress disorder)   Asthma   Self-injurious behavior   Fibromyalgia   Prolonged QT interval  Total Time spent with patient: 45 minutes  Past Psychiatric History:  Schizoaffective Disorder, Bipolar Type, Borderline Personality Disorder, GAD, PTSD, and ASD, and multiple Suicide Attempts, history of Self Injurious Behavior (cutting), and multiple Psychiatric Hospitalizations (  last [facility] 1024).   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    Chronic constipation    Depression    Drug-seeking behavior    Essential tremor    Headache    Ineffective individual coping 05/16/2022   Insomnia 01/12/2022   Intentional drug overdose (HCC) 06/05/2022   Neuromuscular disorder (HCC)    Normocytic anemia 06/05/2022   Overdose 07/22/2017   Overdose of acetaminophen 07/2017   and other meds   Overdose, intentional self-harm, initial encounter (HCC) 07/20/2021   Paranoia (HCC) 04/22/2021   Personality disorder (HCC)    Prolonged QT interval    Purposeful non-suicidal drug ingestion (HCC) 06/27/2021   Schizo-affective psychosis (HCC)    Schizoaffective disorder (HCC) 07/29/2022   Schizoaffective disorder, bipolar type (HCC)    Seizures (HCC)    Last seizure December 2017   Skin erythema 04/27/2022   Sleep apnea    Suicidal behavior 07/25/2021   Suicidal ideation    Suicide (HCC) 07/01/2021   Suicide attempt (HCC) 07/04/2021    Past Surgical History:  Procedure Laterality Date   MOUTH SURGERY  2009 or 2010   Family History:  Family History  Problem Relation Age of Onset   Mental illness Father    Asthma Father    PDD Brother    Seizures Brother    Family Psychiatric  History: Mental illness in her father; PDD in her brother; Seizures in her brother.  Social History:  Social History   Substance and Sexual Activity  Alcohol Use No   Alcohol/week: 1.0 standard drink of alcohol   Types: 1 Standard drinks or equivalent per week   Comment: denies at this time     Social History   Substance and Sexual Activity  Drug Use No   Comment: History of cocaine use at age 44 for 4 months    Social History   Socioeconomic History   Marital status: Widowed    Spouse name: Not on file   Number of children: 0   Years of education: Not on file   Highest education level:  Not on file  Occupational History   Occupation: disability  Tobacco Use   Smoking status: Former    Types: Cigarettes   Smokeless tobacco: Never   Tobacco comments:    Smoked for 2  years age [age]  Vaping Use   Vaping status: Never Used  Substance and Sexual Activity   Alcohol use: No    Alcohol/week: 1.0 standard drink of alcohol    Types: 1 Standard drinks or equivalent per week    Comment: denies at this time   Drug use: No    Comment: History of cocaine use at age 49 for 4 months   Sexual activity: Not Currently    Birth control/protection: None  Other Topics Concern   Not on file  Social History Narrative   Marital status: Widowed      Children: daughter      Lives: with boyfriend, in two story home      Employment:  Disability      Tobacco: quit smoking; smoked  for two years.      Alcohol ;none      Drugs: none   Has not traveled outside of the country.   Right handed         Patient with hx of Fibromyalgia,Orthostatic Tachycardia,Asthma,Arthritis,Gerd, ASD   Social Determinants of Health   Financial Resource Strain: Not on file  Food Insecurity: No Food Insecurity (12/25/2022)   Hunger Vital Sign    Worried About Running Out of Food in the Last Year: Never true    Ran Out of Food in the Last Year: Never true  Transportation Needs: No Transportation Needs (12/25/2022)   PRAPARE - Administrator, Civil Service (Medical): No    Lack of Transportation (Non-Medical): No  Physical Activity: Not on file  Stress: Not on file  Social Connections: Not on file   Additional Social History:                         Sleep: Poor  Appetite:  Fair  Current Medications: Current Facility-Administered Medications  Medication Dose Route Frequency Provider Last Rate Last Admin   albuterol (PROVENTIL) (2.5 MG/3ML) 0.083% nebulizer solution 2.5 mg  2.5 mg Nebulization Q6H PRN Weber, Kyra A, NP       alum & mag hydroxide-simeth (MAALOX/MYLANTA)  200-200-20 MG/5ML suspension 30 mL  30 mL Oral Q4H PRN Weber, Kyra A, NP       bacitracin ointment   Topical BID Starleen Blue, NP   [phone] Application at 01/13/23 0742   diclofenac Sodium (VOLTAREN) 1 % topical gel 4 g  4 g Topical QID PRN Jearld Lesch, NP   4 g at 01/11/23 2115   diphenhydrAMINE (BENADRYL) capsule 50 mg  50 mg Oral TID PRN Phebe Colla A, NP   50 mg at 01/11/23 2044   Or   diphenhydrAMINE (BENADRYL) injection 50 mg  50 mg Intramuscular TID PRN Weber, Bella Kennedy A, NP   50 mg at 01/08/23 2137   DULoxetine (CYMBALTA) DR capsule 90 mg  90 mg Oral Daily Rex Kras, MD   90 mg at 01/13/23 0743   gabapentin (NEURONTIN) capsule 600 mg  600 mg Oral TID Rex Kras, MD   600 mg at 01/13/23 1307   hydrOXYzine (ATARAX) tablet 25 mg  25 mg Oral TID PRN Starleen Blue, NP   25 mg at 01/13/23 0345   ibuprofen (ADVIL) tablet 600 mg  600 mg Oral Q8H PRN Weber, Kyra A, NP   600 mg at 01/13/23 0226   LORazepam (ATIVAN) tablet 2 mg  2 mg Oral TID PRN Phebe Colla A, NP   2 mg at 01/11/23 2044   Or   LORazepam (ATIVAN) injection 2 mg  2 mg Intramuscular TID PRN Weber, Bella Kennedy A, NP   2 mg at 01/08/23 2136   magnesium hydroxide (MILK OF MAGNESIA) suspension 30 mL  30 mL Oral Daily PRN Weber, Kyra A, NP   30 mL at 01/05/23 0252   melatonin tablet 3 mg  3 mg Oral QHS Leyli Kevorkian, NP   3 mg at 01/12/23 2025   menthol-cetylpyridinium (CEPACOL) lozenge 3 mg  1 lozenge Oral PRN Bobbitt, Shalon E, NP   3 mg at 01/13/23 1140   mometasone-formoterol (DULERA) 200-5 MCG/ACT inhaler 2 puff  2 puff Inhalation BID Ajibola, Ene A, NP   2 puff at 01/13/23 0742   OLANZapine (ZYPREXA) tablet 15 mg  15 mg Oral BID Lauro Franklin, MD  15 mg at 01/13/23 0743   ondansetron (ZOFRAN-ODT) disintegrating tablet 4 mg  4 mg Oral Q8H PRN Weber, Kyra A, NP   4 mg at 01/12/23 1922   pantoprazole (PROTONIX) EC tablet 40 mg  40 mg Oral QAC breakfast Weber, Kyra A, NP   40 mg at 01/13/23 0981   prazosin (MINIPRESS)  capsule 1 mg  1 mg Oral QHS Weber, Kyra A, NP   1 mg at 01/12/23 2025   propafenone (RYTHMOL SR) 12 hr capsule 225 mg  225 mg Oral BID Lauro Franklin, MD   225 mg at 01/13/23 1914    Lab Results:  Results for orders placed or performed during the hospital encounter of 12/19/22 (from the past 48 hour(s))  SARS Coronavirus 2 by RT PCR (hospital order, performed in [facility] hospital lab) *cepheid single result test* Anterior Nasal Swab     Status: None   Collection Time: 01/13/23  8:15 AM   Specimen: Anterior Nasal Swab  Result Value Ref Range   SARS Coronavirus 2 by RT PCR NEGATIVE NEGATIVE    Comment: (NOTE) SARS-CoV-2 target nucleic acids are NOT DETECTED.  The SARS-CoV-2 RNA is generally detectable in upper and lower respiratory specimens during the acute phase of infection. The lowest concentration of SARS-CoV-2 viral copies this assay can detect is 250 copies / mL. A negative result does not preclude SARS-CoV-2 infection and should not be used as the sole basis for treatment or other patient management decisions.  A negative result may occur with improper specimen collection / handling, submission of specimen other than nasopharyngeal swab, presence of viral mutation(s) within the areas targeted by this assay, and inadequate number of viral copies (<250 copies / mL). A negative result must be combined with clinical observations, patient history, and epidemiological information.  Fact Sheet for Patients:   RoadLapTop.co.za  Fact Sheet for Healthcare Providers: http://kim-miller.com/  This test is not yet approved or  cleared by the [place] FDA and has been authorized for detection and/or diagnosis of SARS-CoV-2 by FDA under an Emergency Use Authorization (EUA).  This EUA will remain in effect (meaning this test can be used) for the duration of the COVID-19 declaration under Section 564(b)(1) of the Act, 21  U.S.C. section 360bbb-3(b)(1), unless the authorization is terminated or revoked sooner.  Performed at [facility], [address], [place], [place] [address]     Blood Alcohol level:  Lab Results  Component Value Date   [facility] <10 12/18/2022   ETH <10 12/14/2022    Metabolic Disorder Labs: Lab Results  Component Value Date   HGBA1C 5.4 10/22/2022   MPG 108 10/22/2022   MPG 103 04/20/2022   Lab Results  Component Value Date   PROLACTIN 20.0 12/14/2022   PROLACTIN 66.2 (H) 03/02/2021   Lab Results  Component Value Date   CHOL 188 12/14/2022   TRIG 222 (H) 12/14/2022   HDL 41 12/14/2022   CHOLHDL 4.6 12/14/2022   VLDL 44 (H) 12/14/2022   LDLCALC 103 (H) 12/14/2022   LDLCALC 117 (H) 10/22/2022    Physical Findings: AIMS:  , ,  ,  ,    CIWA:    COWS:     Musculoskeletal: Strength & Muscle Tone: within normal limits Gait & Station: normal Patient leans: N/A  Psychiatric Specialty Exam:  Presentation  General Appearance:  Fairly Groomed  Eye Contact: Fair  Speech: Clear and Coherent  Speech Volume: Normal  Handedness: Right   Mood and  Affect  Mood: Depressed; Anxious  Affect: Congruent   Thought Process  Thought Processes: Coherent  Descriptions of Associations:Intact  Orientation:Full (Time, Place and Person)  Thought Content:Illogical  History of Schizophrenia/Schizoaffective disorder:Yes  Duration of Psychotic Symptoms:Greater than six months  Hallucinations:Hallucinations: None   Ideas of Reference:Paranoia; Delusions; Percusatory  Suicidal Thoughts:Suicidal Thoughts: Yes, Active SI Active Intent and/or Plan: With Plan  Homicidal Thoughts:Homicidal Thoughts: Yes, Active HI Active Intent and/or Plan: With Plan   Sensorium  Memory: Immediate Fair  Judgment: Poor  Insight: Poor   Executive Functions  Concentration: Poor  Attention Span: Poor  Recall: Poor  Fund of  Knowledge: Poor  Language: Fair   Psychomotor Activity  Psychomotor Activity: Psychomotor Activity: Normal  Assets  Assets: Housing; Resilience  Sleep  Sleep: Sleep: Good  Physical Exam: Physical Exam Vitals and nursing note reviewed.  Constitutional:      Appearance: Normal appearance.  HENT:     Head: Normocephalic and atraumatic.  Eyes:     Extraocular Movements: Extraocular movements intact.  Pulmonary:     Effort: Pulmonary effort is normal.  Musculoskeletal:        General: Normal range of motion.     Cervical back: Normal range of motion.  Neurological:     General: No focal deficit present.     Mental Status: She is alert and oriented to person, place, and time.  Psychiatric:        Mood and Affect: Mood is depressed. Affect is flat.        Thought Content: Thought content is paranoid.    Review of Systems  Constitutional:  Negative for fever.  Musculoskeletal:  Negative for myalgias.  Skin:        Healing scratches/abrasions   Neurological:  Negative for tremors.  Psychiatric/Behavioral:  Positive for hallucinations and suicidal ideas. Negative for memory loss and substance abuse. The patient is nervous/anxious and has insomnia.   All other systems reviewed and are negative.  Blood pressure (!) 132/98, pulse (!) 106, temperature (!) 97.3 F (36.3 C), temperature source Oral, resp. rate 16, height 5\' 4"  (1.626 m), weight 112.5 kg, SpO2 100%. Body mass index is 42.57 kg/m.  Treatment Plan Summary: Daily contact with patient to assess and evaluate symptoms and progress in treatment and Medication management   Schizoaffective Disorder, Bipolar Type: -Continue Zyprexa 15 mg BID for mood stability and psychosis -Continue Cymbalta 90 mg daily for depression and chronic pain -Continue Gabapentin 600 mg TID for anxiety and chronic pain -Continue Prazosin 1 mg QHS for nightmares -Continue Agitation Protocol: Ativan/Benadryl   Asthma: -Continue Dulera 2  puff BID -Continue Albuterol Nebuliation 2.5 mg q6 PRN wheezing   -Continue Propafenone 225 mg BID -Continue Protonix 40 mg daily -Continue Melatonin 3 mg QHS for insomnia -Continue PRN's: Ibuprofen, Maalox, Atarax, Milk of Magnesia, Trazodone   Repeat EKG on 11/20 showed Sinus Tach (108) with Qtc: 487, Repeat EKG 12/2, and completed with Qtc lower, but remaining prolonged at 481.  Starleen Blue, NP 01/13/2023, 2:20 PM Patient ID: Madelyn Flavors, female   DOB: 1990-10-10, 32 y.o.   MRN: [id number]

## 2023-01-14 DIAGNOSIS — F250 Schizoaffective disorder, bipolar type: Secondary | ICD-10-CM | POA: Diagnosis not present

## 2023-01-14 NOTE — Group Note (Signed)
Date:  01/14/2023 Time:  8:46 PM  Group Topic/Focus:  Wrap-Up Group:   The focus of this group is to help patients review their daily goal of treatment and discuss progress on daily workbooks.    Participation Level:  Active  Participation Quality:  Appropriate  Affect:  Appropriate  Cognitive:  Appropriate  Insight: Appropriate  Engagement in Group:  Developing/Improving  Modes of Intervention:  Discussion  Additional Comments:  Pt stated her goal for today was to focus on her treatment plan and discuss her discharge plan with her treatment team. Pt stated she accomplished her goals today. Pt stated she talked with her doctor and with her social worker about her care today. Pt rated her overall day a 10. Pt stated she was able to contact her father and a family friend today which improved her overall day. Pt stated she felt better about herself tonight. Pt stated she was able to attend all meals today. Pt stated she took all medications provided today. Pt stated her appetite was pretty good today. Pt rated her sleep last night was fair. Pt stated the goal tonight was to get some rest. Pt stated she had no physical pain tonight. Pt deny visual hallucinations and auditory issues tonight. Pt denies thoughts of harming herself or others. Pt stated she would alert staff if anything changed.  Felipa Furnace 01/14/2023, 8:46 PM

## 2023-01-14 NOTE — Group Note (Signed)
Date:  01/14/2023 Time:  9:33 AM  Group Topic/Focus:  Goals Group:   The focus of this group is to help patients establish daily goals to achieve during treatment and discuss how the patient can incorporate goal setting into their daily lives to aide in recovery.    Participation Level:  Active  Participation Quality:  Appropriate  Affect:  Appropriate  Cognitive:  Appropriate  Insight: Appropriate  Engagement in Group:  Engaged  Modes of Intervention:  Discussion  Additional Comments:  Goal: "Attend all groups"  [name] 01/14/2023, 9:33 AM

## 2023-01-14 NOTE — Progress Notes (Signed)
   01/14/23 2100  Psych Admission Type (Psych Patients Only)  Admission Status Voluntary  Psychosocial Assessment  Patient Complaints None  Eye Contact Brief  Facial Expression Anxious  Affect Anxious  Speech Logical/coherent  Interaction Childlike;Attention-seeking  Motor Activity Fidgety  Appearance/Hygiene Unremarkable  Behavior Characteristics Cooperative;Fidgety  Mood Labile  Thought Process  Coherency Circumstantial  Content Blaming others  Delusions None reported or observed  Perception WDL  Hallucination None reported or observed  Judgment Poor  Confusion None  Danger to Self  Current suicidal ideation? Passive  Self-Injurious Behavior Some self-injurious ideation observed or expressed.  No lethal plan expressed   Agreement Not to Harm Self Yes  Description of Agreement Verbal  Danger to Others  Danger to Others None reported or observed  Danger to Others Abnormal  Harmful Behavior to others No threats or harm toward other people  Destructive Behavior No threats or harm toward property

## 2023-01-14 NOTE — Group Note (Signed)
Recreation Therapy Group Note   Group Topic:Coping Skills  Group Date: 01/14/2023 Start Time: 1012 End Time: 1030 Facilitators: Devron Cohick-McCall, LRT,CTRS Location: [address] Dayroom   Group Topic: Coping Skills   Goal Area(s) Addresses: Patient will define what a coping skill is. Patient will successfully identify positive coping skills they can use post d/c.  Patient will acknowledge benefit(s) of using learned coping skills post d/c.  Group Description: Coping A to Z. Patient asked to identify what a coping skill is and when they use them. Patients with Clinical research associate discussed healthy versus unhealthy coping skills. Next patients were given a blank worksheet titled "Coping Skills A-Z". Patients were instructed to come up with at least one positive coping skill per letter of the alphabet.. Patients were given 15 minutes to brainstorm before ideas were presented to the large group. Patients and LRT debriefed on the importance of coping skill selection based on situation and back-up plans when a skill tried is not effective. At the end of group, patients were given an handout of alphabetized strategies to keep for future reference.   Education: Pharmacologist, Scientist, physiological, Discharge Planning.    Education Outcome: Acknowledges education/Verbalizes understanding/In group clarification offered/Additional education needed   Affect/Mood: Appropriate   Participation Level: Engaged   Participation Quality: Independent   Behavior: Appropriate   Speech/Thought Process: Focused   Insight: Good   Judgement: Good   Modes of Intervention: Worksheet   Patient Response to Interventions:  Engaged   Education Outcome:  In group clarification offered    Clinical Observations/Individualized Feedback: Pt was attentive and engaged during group. Pt expressed using positive coping skills when things happen. Pt got some push back from LRT to which pt admitted to using negative coping skills  for big chunk of time she has been in the hospital. Pt expressed she is better and is using positive coping skills now. Pt identified some positive coping skills as build something, drawing, cleaning, eat healthy snack, jog in place, play sports, reading, yoga and write in a journal.     Plan: Continue to engage patient in RT group sessions 2-3x/week.   Nike Southers-McCall, LRT,CTRS 01/14/2023 1:18 PM

## 2023-01-14 NOTE — Progress Notes (Signed)
[facility] MD Progress Note  01/14/2023 4:23 PM Chalon Yazdani  MRN:  [id number]  HPI: Alexei Hetzel is a 32 yr old female who presented on 11/8 to [facility] with complaints of SI with a plan to OD, she was admitted to [facility] on 11/14.  PPHx is significant for Schizoaffective Disorder, Bipolar Type, Borderline Personality Disorder, GAD, PTSD, and ASD, and multiple Suicide Attempts, history of Self Injurious Behavior (cutting), and multiple Psychiatric Hospitalizations (last [facility] 1024).   Patient assessment: On assessment today, the pt reports that their mood is euthymic, improved since admission, and stable. Denies feeling down, depressed, or sad.  Reports that anxiety symptoms are at manageable level.  Sleep is stable. Appetite is stable.  Concentration is without complaint.  Energy level is adequate. Denies having any suicidal thoughts. Denies having any suicidal intent and plan.  Denies having any HI.  Denies having psychotic symptoms.   Denies having side effects to current psychiatric medications.   Discussed discharge planning for tomorrow, 12/10.  CSW contacted patient's ACT team and they are aware, and patient's father also aware and was notified by CSW, and shares that he typically checks on patient in the community.  Patient is verbally contracting for safety, states that she has talked to her father, and no longer has thoughts of wanting to hurt her father or her family.  She reports that she has forgiven them, and let go of the past, and states that the last time she had auditory hallucinations was 24 hours ago.  Patient denies any intent or plan to harm herself or anyone else in the community.  If symptoms remain as they are currently, we will plan to discharge patient tomorrow, and her ACT team has been notified to plan to pick up patient from the hospital tomorrow.  Continuing all medications as listed below with no changes at this time. No TD/EPS type symptoms found on  assessment, and pt denies any feelings of stiffness. AIMS: 0.   Principal Problem: Schizoaffective disorder, bipolar type (HCC) Diagnosis: Principal Problem:   Schizoaffective disorder, bipolar type (HCC) Active Problems:   Borderline personality disorder (HCC)   Asperger syndrome   PTSD (post-traumatic stress disorder)   Asthma   Self-injurious behavior   Fibromyalgia   Prolonged QT interval  Total Time spent with patient: 45 minutes  Past Psychiatric History:  Schizoaffective Disorder, Bipolar Type, Borderline Personality Disorder, GAD, PTSD, and ASD, and multiple Suicide Attempts, history of Self Injurious Behavior (cutting), and multiple Psychiatric Hospitalizations (last [facility] 1024).   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    Chronic constipation    Depression    Drug-seeking behavior    Essential tremor    Headache    Ineffective individual coping 05/16/2022   Insomnia 01/12/2022   Intentional drug overdose (HCC) 06/05/2022   Neuromuscular disorder (HCC)    Normocytic anemia 06/05/2022   Overdose 07/22/2017   Overdose of acetaminophen 07/2017   and other meds   Overdose, intentional self-harm, initial encounter (HCC) 07/20/2021   Paranoia (HCC) 04/22/2021   Personality disorder (HCC)    Prolonged QT  interval    Purposeful non-suicidal drug ingestion (HCC) 06/27/2021   Schizo-affective psychosis (HCC)    Schizoaffective disorder (HCC) 07/29/2022   Schizoaffective disorder, bipolar type (HCC)    Seizures (HCC)    Last seizure December 2017   Skin erythema 04/27/2022   Sleep apnea    Suicidal behavior 07/25/2021    Suicidal ideation    Suicide (HCC) 07/01/2021   Suicide attempt (HCC) 07/04/2021    Past Surgical History:  Procedure Laterality Date   MOUTH SURGERY  2009 or 2010   Family History:  Family History  Problem Relation Age of Onset   Mental illness Father    Asthma Father    PDD Brother    Seizures Brother    Family Psychiatric  History: Mental illness in her father; PDD in her brother; Seizures in her brother.  Social History:  Social History   Substance and Sexual Activity  Alcohol Use No   Alcohol/week: 1.0 standard drink of alcohol   Types: 1 Standard drinks or equivalent per week   Comment: denies at this time     Social History   Substance and Sexual Activity  Drug Use No   Comment: History of cocaine use at age 20 for 4 months    Social History   Socioeconomic History   Marital status: Widowed    Spouse name: Not on file   Number of children: 0   Years of education: Not on file   Highest education level: Not on file  Occupational History   Occupation: disability  Tobacco Use   Smoking status: Former    Types: Cigarettes   Smokeless tobacco: Never   Tobacco comments:    Smoked for 2  years age [age]  Vaping Use   Vaping status: Never Used  Substance and Sexual Activity   Alcohol use: No    Alcohol/week: 1.0 standard drink of alcohol    Types: 1 Standard drinks or equivalent per week    Comment: denies at this time   Drug use: No    Comment: History of cocaine use at age 74 for 4 months   Sexual activity: Not Currently    Birth control/protection: None  Other Topics Concern   Not on file  Social History Narrative   Marital status: Widowed      Children: daughter      Lives: with boyfriend, in two story home      Employment:  Disability      Tobacco: quit smoking; smoked for two years.      Alcohol ;none      Drugs: none   Has not traveled outside of the country.   Right handed         Patient with hx of Fibromyalgia,Orthostatic  Tachycardia,Asthma,Arthritis,Gerd, ASD   Social Determinants of Health   Financial Resource Strain: Not on file  Food Insecurity: No Food Insecurity (12/25/2022)   Hunger Vital Sign    Worried About Running Out of Food in the Last Year: Never true    Ran Out of Food in the Last Year: Never true  Transportation Needs: No Transportation Needs (12/25/2022)   PRAPARE - Administrator, Civil Service (Medical): No    Lack of Transportation (Non-Medical): No  Physical Activity: Not on file  Stress: Not on file  Social Connections: Not on file   Additional Social History:  Sleep: Poor  Appetite:  Fair  Current Medications: Current Facility-Administered Medications  Medication Dose Route Frequency Provider Last Rate Last Admin   albuterol (PROVENTIL) (2.5 MG/3ML) 0.083% nebulizer solution 2.5 mg  2.5 mg Nebulization Q6H PRN Weber, Kyra A, NP       alum & mag hydroxide-simeth (MAALOX/MYLANTA) 200-200-20 MG/5ML suspension 30 mL  30 mL Oral Q4H PRN Weber, Bella Kennedy A, NP       bacitracin ointment   Topical BID Starleen Blue, NP   [phone] Application at 01/14/23 0820   diclofenac Sodium (VOLTAREN) 1 % topical gel 4 g  4 g Topical QID PRN Jearld Lesch, NP   4 g at 01/14/23 1502   diphenhydrAMINE (BENADRYL) capsule 50 mg  50 mg Oral TID PRN Phebe Colla A, NP   50 mg at 01/11/23 2044   Or   diphenhydrAMINE (BENADRYL) injection 50 mg  50 mg Intramuscular TID PRN Weber, Bella Kennedy A, NP   50 mg at 01/08/23 2137   DULoxetine (CYMBALTA) DR capsule 90 mg  90 mg Oral Daily Rex Kras, MD   90 mg at 01/14/23 0816   gabapentin (NEURONTIN) capsule 600 mg  600 mg Oral TID Rex Kras, MD   600 mg at 01/14/23 1402   hydrOXYzine (ATARAX) tablet 25 mg  25 mg Oral TID PRN Starleen Blue, NP   25 mg at 01/14/23 0112   ibuprofen (ADVIL) tablet 600 mg  600 mg Oral Q8H PRN Weber, Kyra A, NP   600 mg at 01/14/23 0818   LORazepam (ATIVAN) tablet 2 mg  2 mg Oral TID  PRN Phebe Colla A, NP   2 mg at 01/11/23 2044   Or   LORazepam (ATIVAN) injection 2 mg  2 mg Intramuscular TID PRN Weber, Bella Kennedy A, NP   2 mg at 01/08/23 2136   magnesium hydroxide (MILK OF MAGNESIA) suspension 30 mL  30 mL Oral Daily PRN Weber, Kyra A, NP   30 mL at 01/05/23 0252   melatonin tablet 3 mg  3 mg Oral QHS Neal Trulson, NP   3 mg at 01/13/23 2026   menthol-cetylpyridinium (CEPACOL) lozenge 3 mg  1 lozenge Oral PRN Bobbitt, Shalon E, NP   3 mg at 01/13/23 1712   mometasone-formoterol (DULERA) 200-5 MCG/ACT inhaler 2 puff  2 puff Inhalation BID Ajibola, Ene A, NP   2 puff at 01/14/23 0819   OLANZapine (ZYPREXA) tablet 15 mg  15 mg Oral BID Lauro Franklin, MD   15 mg at 01/14/23 0816   ondansetron (ZOFRAN-ODT) disintegrating tablet 4 mg  4 mg Oral Q8H PRN Weber, Kyra A, NP   4 mg at 01/14/23 1505   pantoprazole (PROTONIX) EC tablet 40 mg  40 mg Oral QAC breakfast Weber, Kyra A, NP   40 mg at 01/14/23 0602   prazosin (MINIPRESS) capsule 1 mg  1 mg Oral QHS Weber, Kyra A, NP   1 mg at 01/13/23 2026   propafenone (RYTHMOL SR) 12 hr capsule 225 mg  225 mg Oral BID Lauro Franklin, MD   225 mg at 01/14/23 0454    Lab Results:  Results for orders placed or performed during the hospital encounter of 12/19/22 (from the past 48 hour(s))  SARS Coronavirus 2 by RT PCR (hospital order, performed in [facility] hospital lab) *cepheid single result test* Anterior Nasal Swab     Status: None   Collection Time: 01/13/23  8:15 AM   Specimen: Anterior Nasal Swab  Result Value  Ref Range   SARS Coronavirus 2 by RT PCR NEGATIVE NEGATIVE    Comment: (NOTE) SARS-CoV-2 target nucleic acids are NOT DETECTED.  The SARS-CoV-2 RNA is generally detectable in upper and lower respiratory specimens during the acute phase of infection. The lowest concentration of SARS-CoV-2 viral copies this assay can detect is 250 copies / mL. A negative result does not preclude SARS-CoV-2 infection and should not  be used as the sole basis for treatment or other patient management decisions.  A negative result may occur with improper specimen collection / handling, submission of specimen other than nasopharyngeal swab, presence of viral mutation(s) within the areas targeted by this assay, and inadequate number of viral copies (<250 copies / mL). A negative result must be combined with clinical observations, patient history, and epidemiological information.  Fact Sheet for Patients:   RoadLapTop.co.za  Fact Sheet for Healthcare Providers: http://kim-miller.com/  This test is not yet approved or  cleared by the [place] FDA and has been authorized for detection and/or diagnosis of SARS-CoV-2 by FDA under an Emergency Use Authorization (EUA).  This EUA will remain in effect (meaning this test can be used) for the duration of the COVID-19 declaration under Section 564(b)(1) of the Act, 21 U.S.C. section 360bbb-3(b)(1), unless the authorization is terminated or revoked sooner.  Performed at [facility], [address] [place], [place] [address]     Blood Alcohol level:  Lab Results  Component Value Date   [facility] <10 12/18/2022   ETH <10 12/14/2022    Metabolic Disorder Labs: Lab Results  Component Value Date   HGBA1C 5.4 10/22/2022   MPG 108 10/22/2022   MPG 103 04/20/2022   Lab Results  Component Value Date   PROLACTIN 20.0 12/14/2022   PROLACTIN 66.2 (H) 03/02/2021   Lab Results  Component Value Date   CHOL 188 12/14/2022   TRIG 222 (H) 12/14/2022   HDL 41 12/14/2022   CHOLHDL 4.6 12/14/2022   VLDL 44 (H) 12/14/2022   LDLCALC 103 (H) 12/14/2022   LDLCALC 117 (H) 10/22/2022    Physical Findings: AIMS:  , ,  ,  ,    CIWA:    COWS:     Musculoskeletal: Strength & Muscle Tone: within normal limits Gait & Station: normal Patient leans: N/A  Psychiatric Specialty Exam:  Presentation  General  Appearance:  Appropriate for Environment; Casual  Eye Contact: Good  Speech: Clear and Coherent  Speech Volume: Normal  Handedness: Right   Mood and Affect  Mood: Depressed; Anxious  Affect: Appropriate   Thought Process  Thought Processes: Coherent  Descriptions of Associations:Intact  Orientation:Full (Time, Place and Person)  Thought Content:Logical  History of Schizophrenia/Schizoaffective disorder:Yes  Duration of Psychotic Symptoms:Greater than six months  Hallucinations:Hallucinations: None   Ideas of Reference:None  Suicidal Thoughts:Suicidal Thoughts: No SI Active Intent and/or Plan: With Plan  Homicidal Thoughts:Homicidal Thoughts: No HI Active Intent and/or Plan: With Plan   Sensorium  Memory: Immediate Good  Judgment: Fair  Insight: Fair   Executive Functions  Concentration: Good  Attention Span: Good  Recall: Dudley Major of Knowledge: Fair  Language: Good   Psychomotor Activity  Psychomotor Activity: Psychomotor Activity: Normal  Assets  Assets: Desire for Improvement  Sleep  Sleep: Sleep: Good  Physical Exam: Physical Exam Vitals and nursing note reviewed.  Constitutional:      Appearance: Normal appearance.  HENT:     Head: Normocephalic and atraumatic.  Eyes:     Extraocular Movements:  Extraocular movements intact.  Pulmonary:     Effort: Pulmonary effort is normal.  Musculoskeletal:        General: Normal range of motion.     Cervical back: Normal range of motion.  Neurological:     General: No focal deficit present.     Mental Status: She is alert and oriented to person, place, and time.  Psychiatric:        Mood and Affect: Mood is depressed. Affect is flat.        Thought Content: Thought content is paranoid.    Review of Systems  Constitutional:  Negative for fever.  Musculoskeletal:  Negative for myalgias.  Skin:        Healing scratches/abrasions   Neurological:  Negative for  tremors.  Psychiatric/Behavioral:  Negative for hallucinations, memory loss, substance abuse and suicidal ideas. The patient is nervous/anxious. The patient does not have insomnia.   All other systems reviewed and are negative.  Blood pressure 134/68, pulse (!) 110, temperature 97.7 F (36.5 C), temperature source Oral, resp. rate 18, height 5\' 4"  (1.626 m), weight 112.5 kg, SpO2 98%. Body mass index is 42.57 kg/m.  Treatment Plan Summary: Daily contact with patient to assess and evaluate symptoms and progress in treatment and Medication management   Schizoaffective Disorder, Bipolar Type: -Continue Zyprexa 15 mg BID for mood stability and psychosis -Continue Cymbalta 90 mg daily for depression and chronic pain -Continue Gabapentin 600 mg TID for anxiety and chronic pain -Continue Prazosin 1 mg QHS for nightmares -Continue Agitation Protocol: Ativan/Benadryl   Asthma: -Continue Dulera 2 puff BID -Continue Albuterol Nebuliation 2.5 mg q6 PRN wheezing   -Continue Propafenone 225 mg BID -Continue Protonix 40 mg daily -Continue Melatonin 3 mg QHS for insomnia -Continue PRN's: Ibuprofen, Maalox, Atarax, Milk of Magnesia, Trazodone   Repeat EKG on 11/20 showed Sinus Tach (108) with Qtc: 487, Repeat EKG 12/2, and completed with Qtc lower, but remaining prolonged at 481.  Starleen Blue, NP 01/14/2023, 4:23 PM Patient ID: Madelyn Flavors, female   DOB: 05/21/1990, 32 y.o.   MRN: [id number]  Patient ID: Elsie Lout, female   DOB: [birth year], 32 y.o.   MRN: [id number]

## 2023-01-14 NOTE — Plan of Care (Signed)
?  Problem: Activity: ?Goal: Interest or engagement in activities will improve ?Outcome: Progressing ?  ?Problem: Coping: ?Goal: Ability to verbalize frustrations and anger appropriately will improve ?Outcome: Progressing ?  ?Problem: Health Behavior/Discharge Planning: ?Goal: Compliance with treatment plan for underlying cause of condition will improve ?Outcome: Progressing ?  ?Problem: Safety: ?Goal: Periods of time without injury will increase ?Outcome: Progressing ?  ?

## 2023-01-14 NOTE — Progress Notes (Signed)
Pt gave CSW permission to speak with father, Oliver Hum [phone] to determine level of support. Mr. Kelli Churn reported that he is supportive of pt however due to his physical health he has not visited her in several years. Mr. Kelli Churn reported that he has spoken with pt daily since hospitalized and 2-3 times  week when she is at home. CSW shared with father that pt is due to discharge on per  01/15/2023  NP [place] . Pt's father reported he will continue to check on pt and provide current level of support.   CSW spoke with ACTT, [facility] [phone] who reported they can transport home upon discharge. Pt made aware.

## 2023-01-14 NOTE — Progress Notes (Signed)
Patient denies SI,HI, and A/V/H with no plan or intent this morning. Patient allowed to obtain phone to pay bills as ordered per provider. Cellphone and wallet placed back in locker with security. Patient is med compliant and remained cooperative on unit throughout day.    01/14/23 0816  Psych Admission Type (Psych Patients Only)  Admission Status Voluntary  Psychosocial Assessment  Patient Complaints None  Eye Contact Fair  Facial Expression Animated  Affect Labile  Speech Logical/coherent  Interaction Childlike  Motor Activity Fidgety  Appearance/Hygiene Unremarkable  Behavior Characteristics Cooperative;Fidgety  Mood Labile  Thought Process  Coherency Circumstantial  Content WDL  Delusions None reported or observed  Perception WDL  Hallucination None reported or observed  Judgment Poor  Confusion None  Danger to Self  Current suicidal ideation? Denies  Self-Injurious Behavior No self-injurious ideation or behavior indicators observed or expressed   Agreement Not to Harm Self Yes  Description of Agreement verbal  Danger to Others  Danger to Others None reported or observed  Danger to Others Abnormal  Harmful Behavior to others No threats or harm toward other people  Destructive Behavior No threats or harm toward property

## 2023-01-14 NOTE — Group Note (Unsigned)
LCSW Group Therapy Note   Group Date: 01/14/2023 Start Time: 1300 End Time: 1400  Type of Therapy and Topic:  Group Therapy:  Feelings About Hospitalization  Participation Level:  Active   Description of Group This process group involved patients discussing their feelings related to being hospitalized, as well as the benefits they see to being in the hospital.  These feelings and benefits were itemized.  The group then brainstormed specific ways in which they could seek those same benefits when they discharge and return home.  Therapeutic Goals Patient will identify and describe positive and negative feelings related to hospitalization Patient will verbalize benefits of hospitalization to themselves personally Patients will brainstorm together ways they can obtain similar benefits in the outpatient setting, identify barriers to wellness and possible solutions  Summary of Patient Progress:  Pt expressed her primary feelings about being hospitalized. Patient demonstrated good insight into the subject matter, was respectful of peers, and participated throughout the entire session.  Therapeutic Modalities Cognitive Behavioral Therapy Motivational Interviewing  [name] 01/15/2023  9:37 AM

## 2023-01-15 DIAGNOSIS — F250 Schizoaffective disorder, bipolar type: Secondary | ICD-10-CM | POA: Diagnosis not present

## 2023-01-15 MED ORDER — DICLOFENAC SODIUM 1 % EX GEL: 4.0000 g | Freq: Four times a day (QID) | CUTANEOUS | 0 refills | Status: DC | PRN

## 2023-01-15 MED ORDER — OLANZAPINE 15 MG PO TABS: 15.0000 mg | ORAL_TABLET | Freq: Two times a day (BID) | ORAL | 0 refills | Status: DC

## 2023-01-15 MED ORDER — MOMETASONE FURO-FORMOTEROL FUM 200-5 MCG/ACT IN AERO: 2.0000 | INHALATION_SPRAY | Freq: Two times a day (BID) | RESPIRATORY_TRACT | 0 refills | Status: DC

## 2023-01-15 MED ORDER — HYDROXYZINE HCL 25 MG PO TABS: 25.0000 mg | ORAL_TABLET | Freq: Three times a day (TID) | ORAL | 0 refills | Status: DC | PRN

## 2023-01-15 MED ORDER — PANTOPRAZOLE SODIUM 40 MG PO TBEC: 40.0000 mg | DELAYED_RELEASE_TABLET | Freq: Every day | ORAL | 0 refills | Status: DC

## 2023-01-15 MED ORDER — DULOXETINE HCL 30 MG PO CPEP: 90.0000 mg | ORAL_CAPSULE | Freq: Every day | ORAL | 0 refills | Status: DC

## 2023-01-15 MED ORDER — PRAZOSIN HCL 1 MG PO CAPS: 1.0000 mg | ORAL_CAPSULE | Freq: Every day | ORAL | 0 refills | Status: DC

## 2023-01-15 MED ORDER — PROPAFENONE HCL ER 225 MG PO CP12: 225.0000 mg | ORAL_CAPSULE | Freq: Two times a day (BID) | ORAL | 0 refills | Status: DC

## 2023-01-15 MED ORDER — GABAPENTIN 300 MG PO CAPS: 600.0000 mg | ORAL_CAPSULE | Freq: Three times a day (TID) | ORAL | 0 refills | Status: DC

## 2023-01-15 MED ORDER — MELATONIN 3 MG PO TABS: 3.0000 mg | ORAL_TABLET | Freq: Every day | ORAL | 0 refills | Status: DC

## 2023-01-15 MED ORDER — ALBUTEROL SULFATE (2.5 MG/3ML) 0.083% IN NEBU: 2.5000 mg | INHALATION_SOLUTION | Freq: Four times a day (QID) | RESPIRATORY_TRACT | 0 refills | Status: DC | PRN

## 2023-01-15 NOTE — [facility] Group Notes (Signed)
[facility] Group Notes:  (Nursing/MHT/Case Management/Adjunct) Adult Psychoeducational Group Note  Date:  01/15/2023 Time:  1:08 PM  Group Topic/Focus:  Goals Group:   The focus of this group is to help patients establish daily goals to achieve during treatment and discuss how the patient can incorporate goal setting into their daily lives to aide in recovery.  Participation Level:  Active  Participation Quality:  Appropriate  Affect:  Appropriate  Cognitive:  Appropriate  Insight: Appropriate  Engagement in Group:  Engaged  Modes of Intervention:  Discussion  Additional Comments:  Pt shared her goals in group.   Benji Poynter Alen Blew 01/15/2023, 1:08 PM

## 2023-01-15 NOTE — Progress Notes (Signed)
  [facility] Adult Case Management Discharge Plan :  Will you be returning to the same living situation after discharge:  Yes,  pt will returning home, pt lives alone. Pt will be picked up by ACTT, [facility] [phone]. At discharge, do you have transportation home?: Yes,  will be transported by ACTT services, [facility] [phone] Do you have the ability to pay for your medications: Yes,  pt has active medical coverage  Release of information consent forms completed and in the chart;  Patient's signature needed at discharge.  Patient to Follow up at:  Follow-up Information     [facility] Follow up.   Why: Please continue with this provider for ACTT services Contact information: [address] [place] [address] [phone]         BEHAVIORAL HEALTH PARTIAL HOSPITALIZATION PROGRAM Follow up on 01/22/2023.   Specialty: Behavioral Health Why: You are scheduled for an assessment for the PHP on Tuesday, 01/22/23 at 1:00 pm. This appointment will last approximately one hour and will be virtual via [employer] Please download the Teams app prior to the appointment. You will receive a link via email to join the meeting and will click on the link to connect. If your appointment is scheduled as a MyChart video visit, please do not join that way, as we need to ensure you are able to use Teams for group.  If you need to cancel or reschedule, please call [phone] and leave a voicemail with your name, date of birth, and phone number Contact information: [address] [place] [address] [phone]                Next level of care provider has access to [facility] Link:yes  Safety Planning and Suicide Prevention discussed: Yes,  will be completed with pt as well as ACTT services     Has patient been referred to the Quitline?: Patient does not use tobacco/nicotine products  Patient has been referred for addiction  treatment: No known substance use disorder.  Prabhleen Montemayor, Candace Cruise, LCSWA 01/15/2023, 11:59 AM

## 2023-01-15 NOTE — Group Note (Signed)
Recreation Therapy Group Note   Group Topic:Stress Management  Group Date: 01/15/2023 Start Time: 1045 End Time: 1110 Facilitators: Chanon Loney-McCall, LRT,CTRS Location: [address] Dayroom   Group Topic: Stress Management  Goal Area(s) Addresses:  Patient will identify positive stress management techniques. Patient will identify benefits of using stress management post d/c.  Intervention:   Group Description; LRT and patients discussed the importance of stress management. Patients sat and when through meditation. Patients listened to a meditation that focused on reducing stress, anxiety and worry. During the meditation, patients were guided through various breathing practices. Patience were to use the breathing patterns to engage in peace.  Education:  Stress Management, Discharge Planning.   Education Outcome: Acknowledges Education   Affect/Mood: Flat   Participation Level: Active   Participation Quality: Independent   Behavior: Restless   Speech/Thought Process: Barely audible    Insight: Fair   Judgement: Fair    Modes of Intervention: Meditation   Patient Response to Interventions:  Receptive   Education Outcome:  In group clarification offered    Clinical Observations/Individualized Feedback: Pt was quiet during group session. Pt was anxious and rocking back and forth in the chair. Pt had a hard time focusing during group session. Pt left early and didn't return.    Plan: Continue to engage patient in RT group sessions 2-3x/week.   [name], LRT,CTRS  01/15/2023 1:50 PM

## 2023-01-15 NOTE — Progress Notes (Signed)
CSW faxed clinicals to [facility] [phone] who reported pt does not qualify due to a diagnosis of Autism Spectrum Disorder. CSW also contacted [facility] who does not accept pt's insurance. [facility] PHP virtual only,referral made. Pt to discharge 01/16/2023.    Pt has been scheduled for PHP appt on 01/22/2023 at 1:00 pm with [facility].

## 2023-01-15 NOTE — [facility] Suicide Risk Assessment (Signed)
Suicide Risk Assessment  Discharge Assessment    [facility] Discharge Suicide Risk Assessment   Principal Problem: Schizoaffective disorder, bipolar type [facility]) Discharge Diagnoses: Principal Problem:   Schizoaffective disorder, bipolar type (HCC) Active Problems:   Borderline personality disorder (HCC)   Asperger syndrome   PTSD (post-traumatic stress disorder)   Asthma   Self-injurious behavior   Fibromyalgia   Prolonged QT interval  HPI: [name] is a 32 yr old female who presented on 11/8 to [facility] with complaints of SI with a plan to OD, she was admitted to [facility] on 11/14.  PPHx is significant for Schizoaffective Disorder, Bipolar Type, Borderline Personality Disorder, GAD, PTSD, and ASD, and multiple Suicide Attempts, history of Self Injurious Behavior (cutting), and multiple Psychiatric Hospitalizations (last [facility]).   During the patient's hospitalization, patient had extensive initial psychiatric evaluation, and follow-up psychiatric evaluations every day. Psychiatric diagnoses provided upon initial assessment: As listed above. Patient's psychiatric medications were adjusted on admission: AS follows: -Discontinue Abilify 2 mg 3 times daily, so as to minimize amount of antipsychotic medications -Start Zyprexa 2.5 mg at 12 PM and at 5 PM, continue 15 mg nightly for psychosis and mood stabilization -Continue gabapentin and reduced to 400 mg 3 times daily starting 11/15 to minimize amount of medications the patient is on (previously 600 mg 3 times daily) -Continue Cymbalta 60 mg daily from fibromyalgia and depressive symptoms -Continue Protonix 40 mg daily for GERD -Continue prazosin 1 mg nightly for nightmares -Continue propafenone to 25 mg twice daily for cardiac arrhythmia -Continue agitation protocol medications as per the [facility]  During the hospitalization, other adjustments were made to the patient's psychiatric medication regimen: Medications at discharge are as  follows:  Psychiatry Medications: -Continue Zyprexa 15 mg BID for mood stability and psychosis -Continue Cymbalta 90 mg daily for depression and chronic pain -Continue Gabapentin 600 mg TID for anxiety and chronic pain -Continue Prazosin 1 mg QHS for nightmares   Medical Medications -Continue Dulera 2 puff BID -Continue Albuterol Nebuliation 2.5 mg q6 PRN wheezing -Continue Propafenone 225 mg BID -Continue Protonix 40 mg daily -Continue Melatonin 3 mg QHS for insomnia  Patient's care was discussed during the interdisciplinary team meeting every day during the hospitalization. The patient denies having side effects to prescribed psychiatric medication. Gradually, patient started adjusting to milieu. The patient was evaluated each day by a clinical provider to ascertain response to treatment. Improvement was noted by the patient's report of decreasing symptoms, improved sleep and appetite, affect, medication tolerance, behavior, and participation in unit programming.  Patient was asked each day to complete a self inventory noting mood, mental status, pain, new symptoms, anxiety and concerns.    Patient initially was reporting AH & SI/HI persistently following hospitalization, & was exhibiting behavioral problems such as physical aggression towards staff & property at the hospital. She required 1:1 for a few days during this hospitalization due to concerns for safety, but was able to be weaned off 1:1 with no issues after verbally contracting for safety verbally on the unit. She was off 1:1 monitoring for at least one week prior to discharge. Symptoms were reported as significantly decreased or resolved completely by discharge.   On day of discharge, the patient reports that their mood is stable. The patient denied having suicidal thoughts for more than 48 hours prior to discharge.  Patient denies having homicidal thoughts.  Patient denies having auditory hallucinations.  Patient denies any visual  hallucinations or other symptoms of psychosis. The  patient was motivated to continue taking medication with a goal of continued improvement in mental health.   The patient reports their target psychiatric symptoms of depression, anxiety, psychosis responded well to the psychiatric medications, and the patient reports overall benefit from this psychiatric hospitalization. Supportive psychotherapy was provided to the patient. The patient also participated in regular group therapy while hospitalized. Coping skills, problem solving as well as relaxation therapies were also part of the unit programming.  Labs were reviewed with the patient, and abnormal results were discussed with the patient.  The patient is able to verbalize their individual safety plan to this provider.  # It is recommended to the patient to continue psychiatric medications as prescribed, after discharge from the hospital.    # It is recommended to the patient to follow up with your outpatient psychiatric provider and PCP.  # It was discussed with the patient, the impact of alcohol, drugs, tobacco have been there overall psychiatric and medical wellbeing, and total abstinence from substance use was recommended the patient.ed.  # Prescriptions provided or sent directly to preferred pharmacy at discharge. Patient agreeable to plan. Given opportunity to ask questions. Appears to feel comfortable with discharge.    # In the event of worsening symptoms, the patient is instructed to call the crisis hotline (988), 911 and or go to the nearest ED for appropriate evaluation and treatment of symptoms. To follow-up with primary care provider for other medical issues, concerns and or health care needs  # Patient was discharged home with a plan to follow up as noted below.   Suicide Risk:  Chronic Suicide risk due to impulsive nature. Pt is very impulsive and has had multiple suicide attempts in the past via cutting herself which places her at  a chronic risk of suicide. Pt has a good support system in the community in her ACT team who check on her  3 times per week. Father also calls her and checks on her multiple times per week as per his reports. CSW was able to get in touch with father as well as CSW who are both in agreement of discharge. Pt's ACT team will be picking her up from the hospital at discharge today, will pick up her medications when filled, and dispense them to her a week at a time to minimize her risk of overdosing on them. Pt is denying SI/HI/AVH, denies plan or intent to harm self or any one else in the community. Pt is able to verbalize her safety plan to writer at time of discharge to Clinical research associate which includes getting in touch with her ACT team, calling [phone] or 911, 911, going to the [facility] urgent care or nearest ER. She has been educated that it is not guaranteed that symptoms will not return after discharge, and has verbalized understanding.  Total Time spent with patient: 45 minutes  Musculoskeletal: Strength & Muscle Tone: within normal limits Gait & Station: normal Patient leans: N/A  Psychiatric Specialty Exam  Presentation  General Appearance:  Casual; Appropriate for Environment  Eye Contact: Good  Speech: Clear and Coherent  Speech Volume: Normal  Handedness: Right   Mood and Affect  Mood: Euthymic  Duration of Depression Symptoms: Greater than two weeks  Affect: Appropriate; Congruent   Thought Process  Thought Processes: Coherent  Descriptions of Associations:Intact  Orientation:Full (Time, Place and Person)  Thought Content:Logical  History of Schizophrenia/Schizoaffective disorder:Yes  Duration of Psychotic Symptoms:Greater than six months  Hallucinations:Hallucinations: None  Ideas  of Reference:None  Suicidal Thoughts:Suicidal Thoughts: No  Homicidal Thoughts:Homicidal Thoughts: No   Sensorium  Memory: Immediate  Good  Judgment: Good  Insight: Good   Executive Functions  Concentration: Good  Attention Span: Good  Recall: Good  Fund of Knowledge: Good  Language: Good   Psychomotor Activity  Psychomotor Activity: Psychomotor Activity: Normal   Assets  Assets: Social Support   Sleep  Sleep: Sleep: Good   Physical Exam: Physical Exam Vitals and nursing note reviewed.  HENT:     Head: Normocephalic.  Cardiovascular:     Rate and Rhythm: Tachycardia present.  Musculoskeletal:        General: Normal range of motion.    Review of Systems  Psychiatric/Behavioral:  Positive for depression (Denies SI/HI, denies plan or intent to harm self or others). Negative for hallucinations, memory loss, substance abuse and suicidal ideas. The patient is nervous/anxious (Stable for management outpatient) and has insomnia (Stable for management outpatient).   All other systems reviewed and are negative.  Blood pressure 128/78, pulse (!) 103, temperature (!) 97.4 F (36.3 C), temperature source Oral, resp. rate 20, height 5\' 4"  (1.626 m), weight 112.5 kg, SpO2 100%. Body mass index is 42.57 kg/m.Educated to follow up regarding tachycardia with PCP.  Mental Status Per Nursing Assessment::   On Admission:  Suicidal ideation indicated by patient  Demographic Factors:  Caucasian, Low socioeconomic status, and Unemployed  Loss Factors: Financial problems/change in socioeconomic status  Historical Factors: Family history of suicide and Impulsivity-Verbally contracting for safety outside of the hospital at this time, denies SI, denies HI, denies AVH, denies paranoia, is currently future oriented, denies plan or intent to harm self or any one else outside of [facility] health.  Risk Reduction Factors:   Positive social support and Positive therapeutic relationshipHas an ACTT who are supportive.  Continued Clinical Symptoms:  More than one psychiatric diagnosis Previous Psychiatric  Diagnoses and Treatments  Cognitive Features That Contribute To Risk: History of autism   Follow-up Information     [facility] Follow up.   Why: Please continue with this provider for ACTT services Contact information: [address] [place] [address] [phone]         [facility] Follow up.   Specialty: Behavioral Health Why: Referral made Contact information: [address] [place] [address] [phone]               Starleen Blue, NP 01/15/2023, 9:16 AM

## 2023-01-15 NOTE — Discharge Summary (Signed)
Physician Discharge Summary Note  Patient:  [name] is an 32 y.o., female MRN:  [id number] DOB:  07/10/1990 Patient phone:  [phone] (home)  Patient address:   [address] [place] [address],  Total Time spent with patient: 45 minutes  Date of Admission:  12/19/2022 Date of Discharge: 01/15/2023  Reason for Admission:  [name] is a 32 yr old female who presented on 11/8 to [facility] with complaints of SI with a plan to OD, she was admitted to [facility] on 11/14. PPHx is significant for Schizoaffective Disorder, Bipolar Type, Borderline Personality Disorder, GAD, PTSD, and ASD, and multiple Suicide Attempts, history of Self Injurious Behavior (cutting), and multiple Psychiatric Hospitalizations (last [facility] 1024).   Principal Problem: Schizoaffective disorder, bipolar type [facility]) Discharge Diagnoses: Principal Problem:   Schizoaffective disorder, bipolar type (HCC) Active Problems:   Borderline personality disorder (HCC)   Asperger syndrome   PTSD (post-traumatic stress disorder)   Asthma   Self-injurious behavior   Fibromyalgia   Prolonged QT interval  Past Psychiatric History: See H & P  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    Chronic constipation    Depression    Drug-seeking behavior    Essential tremor    Headache    Ineffective individual coping 05/16/2022   Insomnia 01/12/2022   Intentional drug overdose (HCC) 06/05/2022   Neuromuscular disorder (HCC)    Normocytic anemia 06/05/2022   Overdose 07/22/2017   Overdose of acetaminophen 07/2017   and other meds   Overdose,  intentional self-harm, initial encounter (HCC) 07/20/2021   Paranoia (HCC) 04/22/2021   Personality disorder (HCC)    Prolonged QT interval    Purposeful non-suicidal drug ingestion (HCC) 06/27/2021   Schizo-affective psychosis (HCC)    Schizoaffective disorder (HCC) 07/29/2022   Schizoaffective disorder, bipolar type (HCC)    Seizures (HCC)    Last seizure December 2017   Skin erythema 04/27/2022   Sleep apnea    Suicidal behavior 07/25/2021   Suicidal ideation    Suicide (HCC) 07/01/2021   Suicide attempt (HCC) 07/04/2021    Past Surgical History:  Procedure Laterality Date   MOUTH SURGERY  2009 or 2010   Family History:  Family History  Problem Relation Age of Onset   Mental illness Father    Asthma Father    PDD Brother    Seizures Brother    Family Psychiatric  History: See H & P Social History:  Social History   Substance and Sexual Activity  Alcohol Use No   Alcohol/week: 1.0 standard drink of alcohol   Types: 1 Standard drinks or equivalent per week   Comment: denies at this time     Social History   Substance and Sexual Activity  Drug Use No   Comment: History of cocaine use at age 32 for 4 months    Social History   Socioeconomic History   Marital status: Widowed    Spouse name: Not on file   Number of children: 0   Years of education: Not on file   Highest education level: Not on file  Occupational History   Occupation: disability  Tobacco Use   Smoking status: Former    Types: Cigarettes   Smokeless tobacco: Never   Tobacco comments:    Smoked for 2  years age [age]  Vaping Use   Vaping status: Never Used  Substance and Sexual Activity   Alcohol use: No    Alcohol/week: 1.0 standard drink of alcohol    Types: 1 Standard drinks or equivalent per week    Comment: denies at this time   Drug use: No    Comment: History of cocaine use at age 32 for 4 months   Sexual activity: Not Currently    Birth control/protection: None  Other Topics  Concern   Not on file  Social History Narrative   Marital status: Widowed      Children: daughter      Lives: with boyfriend, in two story home      Employment:  Disability      Tobacco: quit smoking; smoked for two years.      Alcohol ;none      Drugs: none   Has not traveled outside of the country.   Right handed         Patient with hx of Fibromyalgia,Orthostatic Tachycardia,Asthma,Arthritis,Gerd, ASD   Social Determinants of Health   Financial Resource Strain: Not on file  Food Insecurity: No Food Insecurity (12/25/2022)   Hunger Vital Sign    Worried About Running Out of Food in the Last Year: Never true    Ran Out of Food in the Last Year: Never true  Transportation Needs: No Transportation Needs (12/25/2022)   PRAPARE - Administrator, Civil Service (Medical): No    Lack of Transportation (Non-Medical): No  Physical Activity: Not on file  Stress: Not on file  Social Connections: Not on file   Hospital Course:  During the patient's hospitalization, patient had extensive initial psychiatric evaluation, and follow-up psychiatric evaluations every day. Psychiatric diagnoses provided upon initial assessment: As listed above. Patient's psychiatric medications were adjusted on admission: AS follows: -Discontinue Abilify 2 mg 3 times daily, so as to minimize amount of antipsychotic medications -Start Zyprexa 2.5 mg at 12 PM and at 5 PM, continue 15 mg nightly for psychosis and mood stabilization -Continue gabapentin and reduced to 400 mg 3 times daily starting 11/15 to minimize amount of medications the patient is on (previously 600 mg 3 times daily) -Continue Cymbalta 60 mg daily from fibromyalgia and depressive symptoms -Continue Protonix 40 mg daily for GERD -Continue prazosin 1 mg nightly for nightmares -Continue propafenone to 25 mg twice daily for cardiac arrhythmia -Continue agitation protocol medications as per the [facility]   During the hospitalization, other  adjustments were made to the patient's psychiatric medication regimen: Medications at discharge are as follows:   Psychiatry Medications: -Continue Zyprexa 15 mg BID for mood stability and psychosis -Continue Cymbalta 90 mg daily for depression and chronic pain -Continue Gabapentin 600 mg TID for anxiety and chronic pain -Continue Prazosin 1 mg QHS for nightmares   Medical Medications -Continue Dulera 2 puff BID -Continue Albuterol Nebuliation 2.5 mg q6 PRN wheezing -Continue Propafenone 225 mg BID -Continue Protonix 40 mg daily -Continue Melatonin 3 mg QHS for insomnia  No TD/EPS type symptoms found on assessment, and pt denies any feelings of stiffness. AIMS: 0.   Patient's care was discussed during the interdisciplinary team meeting every day during the hospitalization. The patient denies having side effects to prescribed psychiatric medication. Gradually, patient  started adjusting to milieu. The patient was evaluated each day by a clinical provider to ascertain response to treatment. Improvement was noted by the patient's report of decreasing symptoms, improved sleep and appetite, affect, medication tolerance, behavior, and participation in unit programming.  Patient was asked each day to complete a self inventory noting mood, mental status, pain, new symptoms, anxiety and concerns.     Patient initially was reporting AH & SI/HI persistently following hospitalization, & was exhibiting behavioral problems such as physical aggression towards staff & property at the hospital. She required 1:1 for a few days during this hospitalization due to concerns for safety, but was able to be weaned off 1:1 with no issues after verbally contracting for safety verbally on the unit. She was off 1:1 monitoring for at least one week prior to discharge. Symptoms were reported as significantly decreased or resolved completely by discharge.    On day of discharge, the patient reports that their mood is stable.  The patient denied having suicidal thoughts for more than 48 hours prior to discharge.  Patient denies having homicidal thoughts.  Patient denies having auditory hallucinations.  Patient denies any visual hallucinations or other symptoms of psychosis. The patient was motivated to continue taking medication with a goal of continued improvement in mental health.    The patient reports their target psychiatric symptoms of depression, anxiety, psychosis responded well to the psychiatric medications, and the patient reports overall benefit from this psychiatric hospitalization. Supportive psychotherapy was provided to the patient. The patient also participated in regular group therapy while hospitalized. Coping skills, problem solving as well as relaxation therapies were also part of the unit programming.   Labs were reviewed with the patient, and abnormal results were discussed with the patient.   The patient is able to verbalize their individual safety plan to this provider.   # It is recommended to the patient to continue psychiatric medications as prescribed, after discharge from the hospital.     # It is recommended to the patient to follow up with your outpatient psychiatric provider and PCP.   # It was discussed with the patient, the impact of alcohol, drugs, tobacco have been there overall psychiatric and medical wellbeing, and total abstinence from substance use was recommended the patient.ed.   # Prescriptions provided or sent directly to preferred pharmacy at discharge. Patient agreeable to plan. Given opportunity to ask questions. Appears to feel comfortable with discharge.    # In the event of worsening symptoms, the patient is instructed to call the crisis hotline (988), 911 and or go to the nearest ED for appropriate evaluation and treatment of symptoms. To follow-up with primary care provider for other medical issues, concerns and or health care needs   # Patient was discharged home with  a plan to follow up as noted below.    Suicide Risk:  Chronic Suicide risk due to impulsive nature. Pt is very impulsive and has had multiple suicide attempts in the past via cutting herself which places her at a chronic risk of suicide. Pt has a good support system in the community in her ACT team who check on her  3 times per week. Father also calls her and checks on her multiple times per week as per his reports. CSW was able to get in touch with father as well as CSW who are both in agreement of discharge. Pt's ACT team will be picking her up from the hospital at discharge today, will pick up her  medications when filled, and dispense them to her a week at a time to minimize her risk of overdosing on them. Pt is denying SI/HI/AVH, denies plan or intent to harm self or any one else in the community. Pt is able to verbalize her safety plan to writer at time of discharge to Clinical research associate which includes getting in touch with her ACT team, calling [phone] or 911, 911, going to the [facility] urgent care or nearest ER. She has been educated that it is not guaranteed that symptoms will not return after discharge, and has verbalized understanding.   Total Time spent with patient: 45 minutes Physical Findings: AIMS:0 CIWA:n/a COWS: n/a   Musculoskeletal: Strength & Muscle Tone: within normal limits Gait & Station: normal Patient leans: N/A  Psychiatric Specialty Exam:  Presentation  General Appearance:  Casual; Appropriate for Environment  Eye Contact: Good  Speech: Clear and Coherent  Speech Volume: Normal  Handedness: Right   Mood and Affect  Mood: Euthymic  Affect: Appropriate; Congruent   Thought Process  Thought Processes: Coherent  Descriptions of Associations:Intact  Orientation:Full (Time, Place and Person)  Thought Content:Logical  History of Schizophrenia/Schizoaffective disorder:Yes  Duration of Psychotic Symptoms:Greater than six  months  Hallucinations:Hallucinations: None  Ideas of Reference:None  Suicidal Thoughts:Suicidal Thoughts: No  Homicidal Thoughts:Homicidal Thoughts: No   Sensorium  Memory: Immediate Good  Judgment: Good  Insight: Good   Executive Functions  Concentration: Good  Attention Span: Good  Recall: Good  Fund of Knowledge: Good  Language: Good   Psychomotor Activity  Psychomotor Activity: Psychomotor Activity: Normal   Assets  Assets: Social Support   Sleep  Sleep: Sleep: Good    Physical Exam: Physical Exam Vitals and nursing note reviewed.  Cardiovascular:     Rate and Rhythm: Tachycardia present.     Comments: Educated on the need to follow tachycardia up with her Cardiologist and verbalizes understanding. Musculoskeletal:        General: Normal range of motion.    Review of Systems  Psychiatric/Behavioral:  Positive for depression (Denies SI/HI, denies plan or intent to harm self or any one else in the community). Negative for hallucinations, memory loss, substance abuse and suicidal ideas. The patient is nervous/anxious (Stable on current meds) and has insomnia (Stable on current meds).   All other systems reviewed and are negative.  Blood pressure 128/78, pulse (!) 103, temperature (!) 97.4 F (36.3 C), temperature source Oral, resp. rate 20, height 5\' 4"  (1.626 m), weight 112.5 kg, SpO2 100%. Body mass index is 42.57 kg/m.   Social History   Tobacco Use  Smoking Status Former   Types: Cigarettes  Smokeless Tobacco Never  Tobacco Comments   Smoked for 2  years age [age]   Tobacco Cessation:  N/A, patient does not currently use tobacco products   Blood Alcohol level:  Lab Results  Component Value Date   ETH <10 12/18/2022   ETH <10 12/14/2022    Metabolic Disorder Labs:  Lab Results  Component Value Date   HGBA1C 5.4 10/22/2022   MPG 108 10/22/2022   MPG 103 04/20/2022   Lab Results  Component Value Date   PROLACTIN  20.0 12/14/2022   PROLACTIN 66.2 (H) 03/02/2021   Lab Results  Component Value Date   CHOL 188 12/14/2022   TRIG 222 (H) 12/14/2022   HDL 41 12/14/2022   CHOLHDL 4.6 12/14/2022   VLDL 44 (H) 12/14/2022   LDLCALC 103 (H) 12/14/2022   LDLCALC 117 (H) 10/22/2022  See Psychiatric Specialty Exam and Suicide Risk Assessment completed by Attending Physician prior to discharge.  Discharge destination:  Home  Is patient on multiple antipsychotic therapies at discharge:  No   Has Patient had three or more failed trials of antipsychotic monotherapy by history:  No  Recommended Plan for Multiple Antipsychotic Therapies: NA   Allergies as of 01/15/2023       Reactions   Bee Venom Anaphylaxis   Coconut Flavoring Agent (non-screening) Anaphylaxis, Rash   Fish Allergy Anaphylaxis   Geodon [ziprasidone Hcl] Other (See Comments)   Pt states that this medication causes paralysis of the mouth.     Haloperidol And Related Other (See Comments)   Pt states that this medication causes paralysis of the mouth, jaw locks up   Lithobid [lithium] Other (See Comments)   Seizure-like activity   Roxicodone [oxycodone] Other (See Comments)   Hallucinations    Seroquel [quetiapine] Other (See Comments)   Severe drowsiness    Shellfish Allergy Anaphylaxis   Phenergan [promethazine Hcl] Other (See Comments)   Chest pain    Prilosec [omeprazole] Nausea And Vomiting, Other (See Comments)   Pt can take protonix with no problems   Sulfa Antibiotics Other (See Comments)   Chest pain    Tegretol [carbamazepine] Nausea And Vomiting   Prozac [fluoxetine] Other (See Comments)   Increased Depression and Suicidal thoughts   Tape Other (See Comments)   Skin tears, can only tolerate paper tape.   Tylenol [acetaminophen] Rash, Other (See Comments)   Rash on face         Medication List     STOP taking these medications    albuterol 108 (90 Base) MCG/ACT inhaler Commonly known as: VENTOLIN  HFA Replaced by: albuterol (2.5 MG/3ML) 0.083% nebulizer solution   ARIPiprazole 2 MG tablet Commonly known as: ABILIFY   cyclobenzaprine 10 MG tablet Commonly known as: FLEXERIL   gabapentin 600 MG tablet Commonly known as: NEURONTIN Replaced by: gabapentin 300 MG capsule   NEOSPORIN EX   TUMS PO       TAKE these medications      Indication  albuterol (2.5 MG/3ML) 0.083% nebulizer solution Commonly known as: PROVENTIL Take 3 mLs (2.5 mg total) by nebulization every 6 (six) hours as needed for wheezing or shortness of breath. Replaces: albuterol 108 (90 Base) MCG/ACT inhaler  Indication: Spasm of Lung Air Passages   diclofenac Sodium 1 % Gel Commonly known as: VOLTAREN Apply 4 g topically 4 (four) times daily as needed (pain). What changed:  how much to take when to take this reasons to take this  Indication: Joint Damage causing Pain and Loss of Function   DULoxetine 30 MG capsule Commonly known as: CYMBALTA Take 3 capsules (90 mg total) by mouth daily. Start taking on: January 16, 2023 What changed:  medication strength how much to take  Indication: Generalized Anxiety Disorder   gabapentin 300 MG capsule Commonly known as: NEURONTIN Take 2 capsules (600 mg total) by mouth 3 (three) times daily. Replaces: gabapentin 600 MG tablet  Indication: Fibromyalgia Syndrome   hydrOXYzine 25 MG tablet Commonly known as: ATARAX Take 1 tablet (25 mg total) by mouth 3 (three) times daily as needed for anxiety. What changed: when to take this  Indication: Feeling Anxious   melatonin 3 MG Tabs tablet Take 1 tablet (3 mg total) by mouth at bedtime.  Indication: Trouble Sleeping   mometasone-formoterol 200-5 MCG/ACT Aero Commonly known as: DULERA Inhale 2 puffs into the lungs 2 (  two) times daily.  Indication: Asthma   OLANZapine 15 MG tablet Commonly known as: ZYPREXA Take 1 tablet (15 mg total) by mouth 2 (two) times daily. What changed:  medication  strength when to take this Another medication with the same name was removed. Continue taking this medication, and follow the directions you see here.  Indication: Schizophrenia   ondansetron 4 MG disintegrating tablet Commonly known as: ZOFRAN-ODT Take 1 tablet (4 mg total) by mouth every 8 (eight) hours as needed for nausea or vomiting.  Indication: Nausea and Vomiting   pantoprazole 40 MG tablet Commonly known as: PROTONIX Take 1 tablet (40 mg total) by mouth daily before breakfast.  Indication: Heartburn   prazosin 1 MG capsule Commonly known as: MINIPRESS Take 1 capsule (1 mg total) by mouth at bedtime.  Indication: Frightening Dreams   propafenone 225 MG 12 hr capsule Commonly known as: RYTHMOL SR Take 1 capsule (225 mg total) by mouth 2 (two) times daily.  Indication: tachycardia        Follow-up Information     [facility] Follow up.   Why: Please continue with this provider for ACTT services Contact information: [address] [place] [address] [phone]         [facility] [place] Follow up.   Specialty: [facility] information: [address] [place] [address] [phone]                Signed: Starleen Blue, NP 01/15/2023, 11:32 AM

## 2023-01-15 NOTE — Plan of Care (Signed)
  Problem: Education: Goal: Knowledge of [facility] Education information/materials will improve Outcome: Progressing Goal: Emotional status will improve Outcome: Progressing Goal: Mental status will improve Outcome: Progressing Goal: Verbalization of understanding the information provided will improve Outcome: Progressing   

## 2023-01-15 NOTE — Progress Notes (Signed)
Pt discharged at this time. Pt left facility with ACTT team. Pt removed all belongings and verbalized understanding of discharge instructions. Pt denies SI/HI at this time.

## 2023-01-22 ENCOUNTER — Other Ambulatory Visit: Payer: Self-pay

## 2023-01-22 ENCOUNTER — Emergency Department (HOSPITAL_COMMUNITY): Payer: Medicare PPO

## 2023-01-22 ENCOUNTER — Telehealth (HOSPITAL_COMMUNITY): Payer: Self-pay | Admitting: Professional

## 2023-01-22 ENCOUNTER — Other Ambulatory Visit (HOSPITAL_COMMUNITY): Payer: Medicare PPO | Attending: Psychiatry | Admitting: Professional

## 2023-01-22 ENCOUNTER — Encounter (HOSPITAL_COMMUNITY): Payer: Self-pay | Admitting: Emergency Medicine

## 2023-01-22 ENCOUNTER — Emergency Department (HOSPITAL_COMMUNITY)
Admission: EM | Admit: 2023-01-22 | Discharge: 2023-01-23 | Discharge status: Against Medical Advice | Payer: Medicare PPO | Attending: Emergency Medicine | Admitting: Emergency Medicine

## 2023-01-22 DIAGNOSIS — R059 Cough, unspecified: Secondary | ICD-10-CM | POA: Diagnosis present

## 2023-01-22 DIAGNOSIS — R110 Nausea: Secondary | ICD-10-CM | POA: Diagnosis not present

## 2023-01-22 DIAGNOSIS — R079 Chest pain, unspecified: Secondary | ICD-10-CM | POA: Diagnosis not present

## 2023-01-22 DIAGNOSIS — Z5321 Procedure and treatment not carried out due to patient leaving prior to being seen by health care provider: Secondary | ICD-10-CM | POA: Diagnosis not present

## 2023-01-22 DIAGNOSIS — F250 Schizoaffective disorder, bipolar type: Secondary | ICD-10-CM

## 2023-01-22 LAB — CBC
HCT: 40 % (ref 36.0–46.0)
Hemoglobin: 12.3 g/dL (ref 12.0–15.0)
MCH: 25.4 pg — ABNORMAL LOW (ref 26.0–34.0)
MCHC: 30.8 g/dL (ref 30.0–36.0)
MCV: 82.6 fL (ref 80.0–100.0)
Platelets: 356 10*3/uL (ref 150–400)
RBC: 4.84 MIL/uL (ref 3.87–5.11)
RDW: 13.7 % (ref 11.5–15.5)
WBC: 10.5 10*3/uL (ref 4.0–10.5)
nRBC: 0 % (ref 0.0–0.2)

## 2023-01-22 LAB — TROPONIN I (HIGH SENSITIVITY): Troponin I (High Sensitivity): 4 ng/L (ref <18)

## 2023-01-22 LAB — HCG, SERUM, QUALITATIVE: Preg, Serum: NEGATIVE

## 2023-01-22 LAB — BASIC METABOLIC PANEL
Anion gap: 9 (ref 5–15)
BUN: 7 mg/dL (ref 6–20)
CO2: 26 mmol/L (ref 22–32)
Calcium: 9.4 mg/dL (ref 8.9–10.3)
Chloride: 105 mmol/L (ref 98–111)
Creatinine, Ser: 0.81 mg/dL (ref 0.44–1.00)
GFR, Estimated: >60 mL/min (ref >60)
Glucose, Bld: 100 mg/dL — ABNORMAL HIGH (ref 70–99)
Potassium: 3.6 mmol/L (ref 3.5–5.1)
Sodium: 140 mmol/L (ref 135–145)

## 2023-01-22 NOTE — ED Notes (Signed)
Pt got up and walked out of room, when asked where she was going pt kept walking and would not answer.

## 2023-01-22 NOTE — ED Triage Notes (Signed)
Pt BIB EMS from home for CP and nausea that she woke up with this morning that has progressed throughout the day. Reports a productive cough. States she had a cold last week.

## 2023-01-22 NOTE — Psych (Signed)
Virtual Visit via Video Note  I connected with [name] on 01/22/23 at  1:00 PM EST by a video enabled telemedicine application and verified that I am speaking with the correct person using two identifiers.  Location: Patient: Home Provider: Clinical Home Office   I discussed the limitations of evaluation and management by telemedicine and the availability of in person appointments. The patient expressed understanding and agreed to proceed.  Follow Up Instructions:    I discussed the assessment and treatment plan with the patient. The patient was provided an opportunity to ask questions and all were answered. The patient agreed with the plan and demonstrated an understanding of the instructions.   The patient was advised to call back or seek an in-person evaluation if the symptoms worsen or if the condition fails to improve as anticipated.  I provided 32 minutes of non-face-to-face time during this encounter.   Quinn Axe, [facility]   Cln met with [name] for a PHP evaluation. [name] reports that she is working with [facility] ACT Team and is concerned that she will lose her ACT team if she does PHP too long. Cln discusses PHP could file Medicare but not Medicaid for the PHP services and she would be responsible for what [facility] Medicare would not pay. Lillyannah reports she does not want a big bill. She shares she has had SI 1x since discharging from [facility] on 01/15/23 and called the crisis line for help. She denies any current SI/HI/AVH. She reports she has been hospitalized more than 70x since 32yo and has attempted 40-50x via OD, drinking 409/detergent, jumping in front of cars, and multiple other ways. She reports the last attempt was in July 2024 and she was in the hospital for 3 months and then [facility] for 1 week. She reports she required life support at that time. [name] shares APS was coming after she d/c from [facility] but "the case is now closed." She reports her husband completed  suicide in 2018 after being married for 1 month. Their daughter has lived with his mother since birth and [name] has supervised visits 1x every 2 weeks. She reports she wants to do what she needs to do to get unsupervised visits and then custody.  [name] again denies SI/HI/AVH. She reports she wants to return to ACT Team, which visits her MWF. She is unable to identify any specific names of therapists/providers, but does identify [facility] as the company. She reports she has therapy, medication management, "and I think someone helping me with housing. I don't know." Next ACT Team visit is tomorrow, 12/18. [name] is unsure which member of the team the appointment is with. Cln encouraged [name] to continue on her journey with her ACT Team and to reach out to [facility] in the future if she feels it is a better fit at that time. [name] reports she understands.

## 2023-01-23 NOTE — ED Notes (Signed)
Pt called x2 for vitals with no answer.

## 2023-01-23 NOTE — ED Notes (Signed)
NA for roll call  

## 2023-02-02 ENCOUNTER — Encounter (HOSPITAL_COMMUNITY): Payer: Self-pay | Admitting: Emergency Medicine

## 2023-02-02 ENCOUNTER — Emergency Department (HOSPITAL_COMMUNITY)
Admission: EM | Admit: 2023-02-02 | Discharge: 2023-02-04 | Disposition: A | Discharge status: Other Acute Inpatient Hospital | Payer: Medicare PPO | Attending: Emergency Medicine | Admitting: Emergency Medicine

## 2023-02-02 DIAGNOSIS — J45909 Unspecified asthma, uncomplicated: Secondary | ICD-10-CM | POA: Diagnosis not present

## 2023-02-02 DIAGNOSIS — F319 Bipolar disorder, unspecified: Secondary | ICD-10-CM | POA: Diagnosis present

## 2023-02-02 DIAGNOSIS — F250 Schizoaffective disorder, bipolar type: Secondary | ICD-10-CM | POA: Diagnosis present

## 2023-02-02 DIAGNOSIS — F840 Autistic disorder: Secondary | ICD-10-CM | POA: Insufficient documentation

## 2023-02-02 DIAGNOSIS — R45850 Homicidal ideations: Secondary | ICD-10-CM | POA: Insufficient documentation

## 2023-02-02 DIAGNOSIS — F315 Bipolar disorder, current episode depressed, severe, with psychotic features: Secondary | ICD-10-CM | POA: Diagnosis present

## 2023-02-02 DIAGNOSIS — R440 Auditory hallucinations: Secondary | ICD-10-CM | POA: Insufficient documentation

## 2023-02-02 DIAGNOSIS — R45851 Suicidal ideations: Secondary | ICD-10-CM | POA: Diagnosis not present

## 2023-02-02 DIAGNOSIS — R000 Tachycardia, unspecified: Secondary | ICD-10-CM | POA: Diagnosis not present

## 2023-02-02 DIAGNOSIS — F603 Borderline personality disorder: Secondary | ICD-10-CM | POA: Diagnosis present

## 2023-02-02 LAB — RAPID URINE DRUG SCREEN, HOSP PERFORMED
Amphetamines: NOT DETECTED
Barbiturates: NOT DETECTED
Benzodiazepines: NOT DETECTED
Cocaine: NOT DETECTED
Opiates: NOT DETECTED
Tetrahydrocannabinol: NOT DETECTED

## 2023-02-02 LAB — CBC
HCT: 37.3 % (ref 36.0–46.0)
Hemoglobin: 11.7 g/dL — ABNORMAL LOW (ref 12.0–15.0)
MCH: 25.7 pg — ABNORMAL LOW (ref 26.0–34.0)
MCHC: 31.4 g/dL (ref 30.0–36.0)
MCV: 81.8 fL (ref 80.0–100.0)
Platelets: 312 10*3/uL (ref 150–400)
RBC: 4.56 MIL/uL (ref 3.87–5.11)
RDW: 14.2 % (ref 11.5–15.5)
WBC: 9.6 10*3/uL (ref 4.0–10.5)
nRBC: 0 % (ref 0.0–0.2)

## 2023-02-02 LAB — COMPREHENSIVE METABOLIC PANEL
ALT: 17 U/L (ref 0–44)
AST: 17 U/L (ref 15–41)
Albumin: 3.7 g/dL (ref 3.5–5.0)
Alkaline Phosphatase: 84 U/L (ref 38–126)
Anion gap: 8 (ref 5–15)
BUN: 6 mg/dL (ref 6–20)
CO2: 23 mmol/L (ref 22–32)
Calcium: 8.9 mg/dL (ref 8.9–10.3)
Chloride: 105 mmol/L (ref 98–111)
Creatinine, Ser: 0.83 mg/dL (ref 0.44–1.00)
GFR, Estimated: >60 mL/min (ref >60)
Glucose, Bld: 120 mg/dL — ABNORMAL HIGH (ref 70–99)
Potassium: 3.6 mmol/L (ref 3.5–5.1)
Sodium: 136 mmol/L (ref 135–145)
Total Bilirubin: 0.4 mg/dL (ref <1.2)
Total Protein: 7.3 g/dL (ref 6.5–8.1)

## 2023-02-02 LAB — HCG, SERUM, QUALITATIVE: Preg, Serum: NEGATIVE

## 2023-02-02 LAB — ETHANOL: Alcohol, Ethyl (B): <10 mg/dL (ref <10)

## 2023-02-02 LAB — SALICYLATE LEVEL: Salicylate Lvl: <7 mg/dL — ABNORMAL LOW (ref 7.0–30.0)

## 2023-02-02 LAB — ACETAMINOPHEN LEVEL: Acetaminophen (Tylenol), Serum: <10 ug/mL — ABNORMAL LOW (ref 10–30)

## 2023-02-02 MED ORDER — PANTOPRAZOLE SODIUM 40 MG PO TBEC
40.0000 mg | DELAYED_RELEASE_TABLET | Freq: Every day | ORAL | Status: DC
  Administered 2023-02-02 – 2023-02-04 (×3): 40 mg via ORAL
  Filled 2023-02-02 (×3): qty 1

## 2023-02-02 MED ORDER — CYCLOBENZAPRINE HCL 10 MG PO TABS
10.0000 mg | ORAL_TABLET | Freq: Every day | ORAL | Status: DC
  Administered 2023-02-02 – 2023-02-03 (×2): 10 mg via ORAL
  Filled 2023-02-02 (×2): qty 1

## 2023-02-02 MED ORDER — OLANZAPINE 5 MG PO TABS
15.0000 mg | ORAL_TABLET | Freq: Two times a day (BID) | ORAL | Status: DC
  Administered 2023-02-02 – 2023-02-04 (×5): 15 mg via ORAL
  Filled 2023-02-02 (×5): qty 1

## 2023-02-02 MED ORDER — METFORMIN HCL 500 MG PO TABS
500.0000 mg | ORAL_TABLET | Freq: Every day | ORAL | Status: DC
  Administered 2023-02-02 – 2023-02-04 (×3): 500 mg via ORAL
  Filled 2023-02-02 (×3): qty 1

## 2023-02-02 MED ORDER — GABAPENTIN 300 MG PO CAPS
600.0000 mg | ORAL_CAPSULE | Freq: Three times a day (TID) | ORAL | Status: DC
  Administered 2023-02-02 – 2023-02-04 (×7): 600 mg via ORAL
  Filled 2023-02-02 (×8): qty 2

## 2023-02-02 MED ORDER — PROPAFENONE HCL ER 425 MG PO CP12: 425.0000 mg | ORAL_CAPSULE | Freq: Two times a day (BID) | ORAL | Status: DC

## 2023-02-02 MED ORDER — METFORMIN HCL 500 MG PO TABS: 500.0000 mg | ORAL_TABLET | Freq: Every day | ORAL | Status: DC

## 2023-02-02 MED ORDER — DICLOFENAC SODIUM 1 % EX GEL
2.0000 g | Freq: Four times a day (QID) | CUTANEOUS | Status: DC | PRN
Start: 2023-02-02 — End: 2023-02-04
  Administered 2023-02-02: 2 g via TOPICAL
  Filled 2023-02-02: qty 100

## 2023-02-02 MED ORDER — ALBUTEROL SULFATE HFA 108 (90 BASE) MCG/ACT IN AERS: 1.0000 | INHALATION_SPRAY | Freq: Four times a day (QID) | RESPIRATORY_TRACT | Status: DC | PRN

## 2023-02-02 MED ORDER — DULOXETINE HCL 30 MG PO CPEP
90.0000 mg | ORAL_CAPSULE | Freq: Every day | ORAL | Status: DC
Start: 2023-02-02 — End: 2023-02-04
  Administered 2023-02-02 – 2023-02-04 (×3): 90 mg via ORAL
  Filled 2023-02-02 (×3): qty 3

## 2023-02-02 MED ORDER — PRAZOSIN HCL 1 MG PO CAPS
1.0000 mg | ORAL_CAPSULE | Freq: Every day | ORAL | Status: DC
  Administered 2023-02-02 – 2023-02-03 (×2): 1 mg via ORAL
  Filled 2023-02-02 (×2): qty 1

## 2023-02-02 NOTE — Consult Note (Signed)
[facility] Psychiatric Consult Initial  Patient Name: .Penni Kabacinski  MRN: [id number]  DOB: 1990/12/14  Consult Order details:  Orders (From admission, onward)     Start     Ordered   02/02/23 0500  CONSULT TO CALL ACT TEAM       Ordering Provider: Elpidio Anis, PA-C  Provider:  (Not yet assigned)  Question:  Reason for Consult?  Answer:  Psych consult   02/02/23 0500             Mode of Visit: In person    Psychiatry Consult Evaluation  Service Date: February 02, 2023 LOS:  LOS: 0 days  Chief Complaint suicide, Homicidal ideation  Primary Psychiatric Diagnoses  Bipolar 1 disorder, current or most recent episode  Depresssed, severe with Psychotic features 2.  Suicide ideation  3.  Homicidal ideation  Assessment  Vandi Shatari Moorman is a 32 y.o. female admitted: Presented to the EDfor 02/02/2023  3:21 AM for suicide and homicidal ideation. She carries the psychiatric diagnoses of Borderline Personality disorder, Bipolar disorder, Depressed with Psychotic  features, suicide attempts, GAD, PTSD and,  and has a past medical history of  Seizure, disorder, Migraine, Asthma and Fibromyalgia. Female, 32 years old was seen this morning came in last night with c/o of AH with voices telling her "sh.temporal arteritis, feeling suicidal with plan to walk into traffic or OD on her pills.  She is feeling Homicidal towards her parents for doing and saying things about her without her permission.  Patient is well known to this Psychiatry program and have had multiple inpatient Psychiatry hospitalizations around [place].  Patient reports that her parents can hear her thoughts and they use it against her.  Patient states she was in [facility] last month and two weeks prior to that she was [facility] in [place].  Patient states her Olanzapine is not effective abd that she needs long stay inpatient Psychiatry stay.  Patient is requesting to be transferred to [facility] where she can stay 3-6 months.  Patient is  informed by this provider that she does not meet criteria for a regular Mental health unit and have been receiving great care as well.  Patient states she want long stay hospitalization.  Patient remain suicidal at this time, with plan and homicidal towards her parents without plan.  She reports six hours of sleep and states appetite is good. Patient is engaged in ACT Team services called [facility] and does not know when her next appointment is.   This patient is unable to contract for safety, she has multiple suicide attempts by various means but more with OD on her pills.  We will seek inpatient Psychiatry hospitalization  and will fax records to facilities with available beds.  Patient reports that her high dose Olanzapine does not work for her.  We will resume home Medications while we seek bed placement.   Diagnoses:  Active Hospital problems: Principal Problem:   Bipolar I disorder, current or most recent episode depressed, with psychotic features (HCC) Active Problems:   Borderline personality disorder (HCC)    Plan   ## Psychiatric Medication Recommendations:   Prazosin 1 mg po at bed time-Nightmares, Cymbalta DR, 90 mg po daily for depression.  Gabapentin 600 mg tid for mood, Olanzapine 15 mg po BID for Psychosis  ## Medical Decision Making Capacity:  Recommend inpatient Psychiatry Hospitalization. Resume home Medications -Already resumed Fax out records for bed placement if no available bed in [facility]. ## Further Work-up:  TSH OBTAIN EKG, order is in. -- most recent EKG on 01/22/2023 had QtC of 489 -- Pertinent labwork reviewed earlier this admission includes: CBC, CMP, UDS, Alcohol level, Tylenol level, Salicylate Level, Preg test   ## Disposition:-- We recommend inpatient psychiatric hospitalization when medically cleared. Patient is under voluntary admission status at this time; please IVC if attempts to leave hospital.  ## Behavioral / Environmental: - Patients with  borderline personality traits/disorder often use the language of physical pain to communicate both physical and emotional suffering. It is important to address pain complaints as they arise and attempt to identify an etiology, either organic or psychiatric. In patients with chronic pain, it is important to have a discussion with the patient about expectations about pain control., Patient would benefit from more frequent contact with medical team to delineate plan of care and allow for clarification questions, which will help alleviate anxiety regarding treatment. If possible, try to check back in with the pt in the afternoon., To minimize splitting of staff, assign one staff person to communicate all information from the team when feasible., or Utilize compassion and acknowledge the patient's experiences while setting clear and realistic expectations for care.    ## Safety and Observation Level:  - Based on my clinical evaluation, I estimate the patient to be at Medium  risk of self harm in the current setting. - At this time, we recommend  routine. This decision is based on my review of the chart including patient's history and current presentation, interview of the patient, mental status examination, and consideration of suicide risk including evaluating suicidal ideation, plan, intent, suicidal or self-harm behaviors, risk factors, and protective factors. This judgment is based on our ability to directly address suicide risk, implement suicide prevention strategies, and develop a safety plan while the patient is in the clinical setting. Please contact our team if there is a concern that risk level has changed.  CSSR Risk Category:C-SSRS RISK CATEGORY: No Risk  Suicide Risk Assessment: Patient has following modifiable risk factors for suicide: active suicidal ideation, untreated depression, under treated depression , CAH with suicidal content, and recklessness, which we are addressing by recommending  inpatient Psychiatry hospitalization, Close monitoring of patient.. Patient has following non-modifiable or demographic risk factors for suicide: separation or divorce, history of suicide attempt, history of self harm behavior, and psychiatric hospitalization Patient has the following protective factors against suicide: Access to outpatient mental health care  Thank you for this consult request. Recommendations have been communicated to the primary team.  We will recommend inpatient Mental health hospitalization. at this time.   Earney Navy, NP-PMHNP-BC       History of Present Illness  Relevant Aspects of Hospital ED Course:  Rella Misek is a 32 y.o. female admitted: Presented to the EDfor 02/02/2023  3:21 AM for suicide and homicidal ideation. She carries the psychiatric diagnoses of Borderline Personality disorder, Bipolar disorder, Depressed with Psychotic  features, suicide attempts, GAD, PTSD and,  and has a past medical history of  Seizure, disorder, Migraine, Asthma and Fibromyalgia. Female, 32 years old was seen this morning came in last night with c/o of AH with voices telling her "sh.temporal arteritis, feeling suicidal with plan to walk into traffic or OD on her pills.  She is feeling Homicidal towards her parents for doing and saying things about her without her permission.  Patient is well known to this Psychiatry program and have had multiple inpatient Psychiatry hospitalizations around [place].  Patient reports that  her parents can hear her thoughts and they use it against her.  Patient states she was in [facility] last month and two weeks prior to that she was [facility] in [place].  Patient states her Olanzapine is not effective abd that she needs long stay inpatient Psychiatry stay.  Patient is requesting to be transferred to [facility] where she can stay 3-6 months.  Patient is informed by this provider that she does not meet criteria for a regular Mental health unit and have been receiving  great care as well.  Patient states she want long stay hospitalization.  Patient remain suicidal at this time, with plan and homicidal towards her parents without plan.  She reports six hours of sleep and states appetite is good. Patient is engaged in ACT Team services called [facility] and does not know when her next appointment is.   This patient is unable to contract for safety, she has multiple suicide attempts by various means but more with OD on her pills.  We will seek inpatient Psychiatry hospitalization  and will fax records to facilities with available beds.  Patient reports that her high dose Olanzapine does not work for her.  We will resume home Medications while we seek bed placement.  Psych ROS:  Depression: YES Anxiety:  Yes Mania (lifetime and current): na Psychosis: (lifetime and current): yes  Collateral information:  None needed at this time.  Patient however does not have any person to contact.she says.  Review of Systems  Constitutional: Negative.   HENT: Negative.    Eyes: Negative.   Respiratory: Negative.    Cardiovascular: Negative.   Gastrointestinal: Negative.   Genitourinary: Negative.   Musculoskeletal: Negative.   Skin: Negative.        Scratching arms with her finger nails to self harm  Neurological: Negative.   Endo/Heme/Allergies: Negative.   Psychiatric/Behavioral:  Positive for depression, hallucinations and suicidal ideas. The patient is nervous/anxious.      Psychiatric and Social History  Psychiatric History:  Information collected from patient  Prev Dx/Sx: Borderline Personality disorder, Bipolar disorder, Depressed with Psychotic  features, suicide attempts, GAD, PTSD Current Psych Provider: [facility] Psych[facility]rist Home Meds (current): See above-Prazosin 1 mg po at bed time-Nightmares, Cymbalta DR, 90 mg po daily for depression.  Gabapentin 600 mg tid for mood, Olanzapine 15 mg po BID for Psychosis Previous Med Trials: Haldol,  Vrylar, Aripirazole, Risperidone,  Therapy: yes  Prior Psych Hospitalization: Multiple times  Prior Self Harm: yes Prior Violence: yes  Family Psych History: Yes Family Hx suicide: denies  Social History:  Developmental Hx: Asperger's disease,  Educational Hx: Associate two  years degree Occupational Hx: none, unemployed Armed forces operational officer Hx: denies Living Situation: apartment Spiritual Hx: denies Access to weapons/lethal means: no   Substance History Alcohol: Denies  Type of alcohol na Last Drink na Number of drinks per day denies History of alcohol withdrawal seizures denies History of DT's denies Tobacco: denies Illicit drugs: denies-UDS Negative Prescription drug abuse: denies Rehab hx: denies  Exam Findings  Physical Exam:  Vital Signs:  Temp:  [98.2 F (36.8 C)-98.5 F (36.9 C)] 98.2 F (36.8 C) (12/28 1409) Pulse Rate:  [107-117] 107 (12/28 1409) Resp:  [21-22] 21 (12/28 1409) BP: (143-155)/(65-97) 143/65 (12/28 1409) SpO2:  [99 %] 99 % (12/28 1409) Blood pressure (!) 143/65, pulse (!) 107, temperature 98.2 F (36.8 C), temperature source Oral, resp. rate (!) 21, SpO2 99%. There is no height or weight on file to calculate BMI.  Physical Exam Constitutional:      Appearance: She is obese.  HENT:     Nose: Nose normal.  Cardiovascular:     Rate and Rhythm: Tachycardia present.  Pulmonary:     Effort: Pulmonary effort is normal.  Musculoskeletal:        General: Normal range of motion.  Skin:    General: Skin is dry.     Comments: Scratching her both arms with her finger as a self harm behavior.  Neurological:     Mental Status: She is alert and oriented to person, place, and time.  Psychiatric:        Attention and Perception: Attention normal. She perceives auditory hallucinations.        Mood and Affect: Mood is anxious and depressed. Affect is angry.        Speech: Speech normal.        Behavior: Behavior normal. Behavior is cooperative.        Thought  Content: Thought content includes homicidal and suicidal ideation. Thought content includes suicidal plan.        Cognition and Memory: Cognition and memory normal.        Judgment: Judgment is impulsive.     Mental Status Exam: General Appearance: Casual, Neat, and Obese  Orientation:  Full (Time, Place, and Person)  Memory:  Immediate;   Good Recent;   Good Remote;   Good  Concentration:  Concentration: Good and Attention Span: Good  Recall:  Good  Attention  Good  Eye Contact:  Minimal  Speech:  Clear and Coherent  Language:  Good  Volume:  Normal  Mood: Depressed, angry, Labile  Affect:  Congruent  Thought Process:  Coherent  Thought Content:  Logical  Suicidal Thoughts:  Yes.  with intent/plan  Homicidal Thoughts:  Yes.  without intent/plan  Judgement:  Poor  Insight:  Fair  Psychomotor Activity:  Decreased  Akathisia:  NA  Fund of Knowledge:  Good      Assets:  Communication Skills Desire for Improvement Housing Social Support  Cognition:  WNL  ADL's:  Intact  AIMS (if indicated):        Other History   These have been pulled in through the EMR, reviewed, and updated if appropriate.  Family History:  The patient's family history includes Asthma in her father; Mental illness in her father; PDD in her brother; Seizures in her brother.  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    Chronic constipation    Depression    Drug-seeking behavior    Essential tremor    Headache    Ineffective individual coping 05/16/2022   Insomnia 01/12/2022   Intentional drug overdose (HCC) 06/05/2022   Neuromuscular disorder (HCC)    Normocytic  anemia 06/05/2022   Overdose 07/22/2017   Overdose of acetaminophen 07/2017   and other meds   Overdose, intentional self-harm, initial encounter (HCC) 07/20/2021   Paranoia (HCC) 04/22/2021   Personality disorder (HCC)    Prolonged QT interval    Purposeful non-suicidal drug ingestion (HCC) 06/27/2021   Schizo-affective psychosis (HCC)    Schizoaffective disorder (HCC) 07/29/2022  Schizoaffective disorder, bipolar type (HCC)    Seizures (HCC)    Last seizure December 2017   Skin erythema 04/27/2022   Sleep apnea    Suicidal behavior 07/25/2021   Suicidal ideation    Suicide (HCC) 07/01/2021   Suicide attempt (HCC) 07/04/2021    Surgical History: Past Surgical History:  Procedure Laterality Date   MOUTH SURGERY  2009 or 2010     Medications:   Current Facility-Administered Medications:    albuterol (VENTOLIN HFA) 108 (90 Base) MCG/ACT inhaler 1-2 puff, 1-2 puff, Inhalation, Q6H PRN, Long, Arlyss Repress, MD   cyclobenzaprine (FLEXERIL) tablet 10 mg, 10 mg, Oral, QHS, Upstill, Shari, PA-C   diclofenac Sodium (VOLTAREN) 1 % topical gel 2 g, 2 g, Topical, QID PRN, Long, Arlyss Repress, MD, 2 g at 02/02/23 0907   DULoxetine (CYMBALTA) DR capsule 90 mg, 90 mg, Oral, Daily, Upstill, Shari, PA-C, 90 mg at 02/02/23 0865   gabapentin (NEURONTIN) capsule 600 mg, 600 mg, Oral, TID, Upstill, Shari, PA-C, 600 mg at 02/02/23 7846   metFORMIN (GLUCOPHAGE) tablet 500 mg, 500 mg, Oral, Q breakfast, Tanda Rockers A, DO, 500 mg at 02/02/23 0907   OLANZapine (ZYPREXA) tablet 15 mg, 15 mg, Oral, BID, Upstill, Shari, PA-C, 15 mg at 02/02/23 0906   pantoprazole (PROTONIX) EC tablet 40 mg, 40 mg, Oral, QAC breakfast, Upstill, Shari, PA-C, 40 mg at 02/02/23 9629   prazosin (MINIPRESS) capsule 1 mg, 1 mg, Oral, QHS, Upstill, Shari, PA-C   propafenone (RYTHMOL SR) 12 hr capsule 425 mg, 425 mg, Oral, BID, Upstill, Shari, PA-C  Current Outpatient Medications:    albuterol (PROVENTIL) (2.5 MG/3ML) 0.083% nebulizer  solution, Take 3 mLs (2.5 mg total) by nebulization every 6 (six) hours as needed for wheezing or shortness of breath., Disp: 75 mL, Rfl: 0   cyclobenzaprine (FLEXERIL) 10 MG tablet, Take 10 mg by mouth at bedtime., Disp: , Rfl:    diclofenac Sodium (VOLTAREN) 1 % GEL, Apply 4 g topically 4 (four) times daily as needed (pain)., Disp: 4 g, Rfl: 0   DULoxetine (CYMBALTA) 30 MG capsule, Take 3 capsules (90 mg total) by mouth daily., Disp: 90 capsule, Rfl: 0   gabapentin (NEURONTIN) 600 MG tablet, Take 600 mg by mouth 3 (three) times daily., Disp: , Rfl:    hydrOXYzine (ATARAX) 25 MG tablet, Take 1 tablet (25 mg total) by mouth 3 (three) times daily as needed for anxiety., Disp: 30 tablet, Rfl: 0   melatonin 3 MG TABS tablet, Take 1 tablet (3 mg total) by mouth at bedtime., Disp: 30 tablet, Rfl: 0   metFORMIN (GLUCOPHAGE) 500 MG tablet, Take 500 mg by mouth daily., Disp: , Rfl:    OLANZapine (ZYPREXA) 15 MG tablet, Take 1 tablet (15 mg total) by mouth 2 (two) times daily., Disp: 60 tablet, Rfl: 0   ondansetron (ZOFRAN-ODT) 4 MG disintegrating tablet, Take 1 tablet (4 mg total) by mouth every 8 (eight) hours as needed for nausea or vomiting., Disp: 10 tablet, Rfl: 0   pantoprazole (PROTONIX) 40 MG tablet, Take 1 tablet (40 mg total) by mouth daily before breakfast., Disp: 30 tablet, Rfl: 0   prazosin (MINIPRESS) 1 MG capsule, Take 1 capsule (1 mg total) by mouth at bedtime., Disp: 30 capsule, Rfl: 0   propafenone (RYTHMOL SR) 425 MG 12 hr capsule, Take 425 mg by mouth 2 (two) times daily., Disp: , Rfl:    SYMBICORT 160-4.5 MCG/ACT inhaler, Inhale 2 puffs into the lungs in the morning and at  bedtime., Disp: , Rfl:    mometasone-formoterol (DULERA) 200-5 MCG/ACT AERO, Inhale 2 puffs into the lungs 2 (two) times daily. (Patient not taking: Reported on 02/02/2023), Disp: 1 each, Rfl: 0  Allergies: Allergies  Allergen Reactions   Bee Venom Anaphylaxis   Coconut Flavoring Agent (Non-Screening) Anaphylaxis  and Rash   Fish Allergy Anaphylaxis   Geodon [Ziprasidone Hcl] Other (See Comments)    Pt states that this medication causes paralysis of the mouth.     Haloperidol And Related Other (See Comments)    Pt states that this medication causes paralysis of the mouth, jaw locks up   Lithobid [Lithium] Other (See Comments)    Seizure-like activity    Roxicodone [Oxycodone] Other (See Comments)    Hallucinations    Seroquel [Quetiapine] Other (See Comments)    Severe drowsiness    Shellfish Allergy Anaphylaxis   Phenergan [Promethazine Hcl] Other (See Comments)    Chest pain     Prilosec [Omeprazole] Nausea And Vomiting and Other (See Comments)    Pt can take protonix with no problems    Sulfa Antibiotics Other (See Comments)    Chest pain    Tegretol [Carbamazepine] Nausea And Vomiting   Prozac [Fluoxetine] Other (See Comments)    Increased Depression and Suicidal thoughts   Tape Other (See Comments)    Skin tears, can only tolerate paper tape.   Tylenol [Acetaminophen] Rash and Other (See Comments)    Rash on face     Earney Navy, NP-PMHNP-BC

## 2023-02-02 NOTE — ED Notes (Signed)
Pt started beating on wall with her fist. When asked, Pt stated "I want to get the voices to stop." Pt was redirectable to get he to stop hitting the wall. Pt laid down and went back to sleep

## 2023-02-02 NOTE — ED Notes (Signed)
Pt threatening staff and stating "I'm gonna kill ya'll" and again banging on walls in hallway

## 2023-02-02 NOTE — ED Provider Notes (Signed)
[place] EMERGENCY DEPARTMENT AT [facility] Provider Note   CSN: [id number] Arrival date & time: 02/02/23  5784     History  Chief Complaint  Patient presents with   Suicidal    [name] is a 32 y.o. female.  Patient to ED with c/o hearing voices that make her want to kill herself and kill others. No self harm prior to arrival. No recent illness, fever, vomiting, urinary symptoms.   The history is provided by the patient. No language interpreter was used.       Home Medications Prior to Admission medications   Medication Sig Start Date End Date Taking? Authorizing Provider  albuterol (PROVENTIL) (2.5 MG/3ML) 0.083% nebulizer solution Take 3 mLs (2.5 mg total) by nebulization every 6 (six) hours as needed for wheezing or shortness of breath. 01/15/23  Yes Starleen Blue, NP  cyclobenzaprine (FLEXERIL) 10 MG tablet Take 10 mg by mouth at bedtime. 01/24/23  Yes [provider]  diclofenac Sodium (VOLTAREN) 1 % GEL Apply 4 g topically 4 (four) times daily as needed (pain). 01/15/23  Yes Starleen Blue, NP  DULoxetine (CYMBALTA) 30 MG capsule Take 3 capsules (90 mg total) by mouth daily. 01/16/23  Yes Starleen Blue, NP  gabapentin (NEURONTIN) 600 MG tablet Take 600 mg by mouth 3 (three) times daily. 01/24/23  Yes [provider]  hydrOXYzine (ATARAX) 25 MG tablet Take 1 tablet (25 mg total) by mouth 3 (three) times daily as needed for anxiety. 01/15/23  Yes Nkwenti, Doris, NP  melatonin 3 MG TABS tablet Take 1 tablet (3 mg total) by mouth at bedtime. 01/15/23  Yes Starleen Blue, NP  metFORMIN (GLUCOPHAGE) 500 MG tablet Take 500 mg by mouth daily. 01/24/23  Yes [provider]  OLANZapine (ZYPREXA) 15 MG tablet Take 1 tablet (15 mg total) by mouth 2 (two) times daily. 01/15/23  Yes Starleen Blue, NP  ondansetron (ZOFRAN-ODT) 4 MG disintegrating tablet Take 1 tablet (4 mg total) by mouth every 8 (eight) hours as needed for nausea  or vomiting. 12/17/22  Yes Roxy Horseman, PA-C  pantoprazole (PROTONIX) 40 MG tablet Take 1 tablet (40 mg total) by mouth daily before breakfast. 01/15/23 02/14/23 Yes Nkwenti, Doris, NP  prazosin (MINIPRESS) 1 MG capsule Take 1 capsule (1 mg total) by mouth at bedtime. 01/15/23 02/14/23 Yes Starleen Blue, NP  propafenone (RYTHMOL SR) 425 MG 12 hr capsule Take 425 mg by mouth 2 (two) times daily. 01/24/23  Yes [provider]  SYMBICORT 160-4.5 MCG/ACT inhaler Inhale 2 puffs into the lungs in the morning and at bedtime. 01/24/23  Yes [provider]  mometasone-formoterol (DULERA) 200-5 MCG/ACT AERO Inhale 2 puffs into the lungs 2 (two) times daily. Patient not taking: Reported on 02/02/2023 01/15/23   Starleen Blue, NP      Allergies    Bee venom, Coconut flavoring agent (non-screening), Fish allergy, Geodon [ziprasidone hcl], Haloperidol and related, Lithobid [lithium], Roxicodone [oxycodone], Seroquel [quetiapine], Shellfish allergy, Phenergan [promethazine hcl], Prilosec [omeprazole], Sulfa antibiotics, Tegretol [carbamazepine], Prozac [fluoxetine], Tape, and Tylenol [acetaminophen]    Review of Systems   Review of Systems  Physical Exam Updated Vital Signs BP (!) 155/97   Pulse (!) 117   Temp 98.5 F (36.9 C) (Oral)   Resp (!) 22  Physical Exam Constitutional:      General: She is not in acute distress.    Appearance: She is well-developed. She is not ill-appearing.  Cardiovascular:     Rate and Rhythm: Tachycardia present.  Pulmonary:     Effort: Pulmonary effort is normal.  Musculoskeletal:        General: Normal range of motion.     Cervical back: Normal range of motion.  Skin:    General: Skin is warm and dry.  Neurological:     Mental Status: She is alert and oriented to person, place, and time.  Psychiatric:        Attention and Perception: She perceives auditory hallucinations.        Mood and Affect: Mood is anxious.        Speech: Speech is  rapid and pressured.        Behavior: Behavior is cooperative.        Thought Content: Thought content includes homicidal and suicidal ideation.     ED Results / Procedures / Treatments   Labs (all labs ordered are listed, but only abnormal results are displayed) Labs Reviewed  COMPREHENSIVE METABOLIC PANEL - Abnormal; Notable for the following components:      Result Value   Glucose, Bld 120 (*)    All other components within normal limits  SALICYLATE LEVEL - Abnormal; Notable for the following components:   Salicylate Lvl <7.0 (*)    All other components within normal limits  ACETAMINOPHEN LEVEL - Abnormal; Notable for the following components:   Acetaminophen (Tylenol), Serum <10 (*)    All other components within normal limits  CBC - Abnormal; Notable for the following components:   Hemoglobin 11.7 (*)    MCH 25.7 (*)    All other components within normal limits  ETHANOL  HCG, SERUM, QUALITATIVE  RAPID URINE DRUG SCREEN, HOSP PERFORMED    EKG None  Radiology No results found.  Procedures Procedures    Medications Ordered in ED Medications  cyclobenzaprine (FLEXERIL) tablet 10 mg (has no administration in time range)  DULoxetine (CYMBALTA) DR capsule 90 mg (has no administration in time range)  gabapentin (NEURONTIN) tablet 600 mg (has no administration in time range)  metFORMIN (GLUCOPHAGE) tablet 500 mg (has no administration in time range)  OLANZapine (ZYPREXA) tablet 15 mg (has no administration in time range)  pantoprazole (PROTONIX) EC tablet 40 mg (has no administration in time range)  prazosin (MINIPRESS) capsule 1 mg (has no administration in time range)  propafenone (RYTHMOL SR) 12 hr capsule 425 mg (has no administration in time range)    ED Course/ Medical Decision Making/ A&P Clinical Course as of 02/02/23 0505  Sat Feb 02, 2023  0458 Patient with a history of schizophrenia here with auditory hallucinations described as voices causing her to feel  like hurting herself and others. She is considered medically cleared for psychiatric evaluation.  [SU]    Clinical Course User Index [SU] Elpidio Anis, PA-C                                 Medical Decision Making Amount and/or Complexity of Data Reviewed Labs: ordered.           Final Clinical Impression(s) / ED Diagnoses Final diagnoses:  Suicidal ideation  Auditory hallucinations  Homicidal ideation    Rx / DC Orders ED Discharge Orders     None         Elpidio Anis, PA-C 02/02/23 0505

## 2023-02-02 NOTE — ED Notes (Signed)
Patient asking to see the provider.

## 2023-02-02 NOTE — BH Assessment (Signed)
Per Lanora Manis, RN , pt is currently asleep. Pt will  be seen by day shift provider.

## 2023-02-02 NOTE — BH Assessment (Signed)
TTS Consult will be completed by [facility] provider Caleen Jobs, NP at approx 0630. Thanks

## 2023-02-02 NOTE — ED Triage Notes (Signed)
Pt reports hearing voices and feeling homicidal to her family and having suicidal ideation. Has recently been on new meds. Changed into scrubs and wanded by security.

## 2023-02-02 NOTE — ED Notes (Signed)
Pt belongings collected and placed in cabinets behind nurses station

## 2023-02-02 NOTE — ED Notes (Signed)
Pt to room 36, 4 bags placed in locker, 2 hospital bags, 2 personal black bags

## 2023-02-02 NOTE — ED Notes (Signed)
Patient scratching arm.  Patient states,"hearing voices". Patient redirected, reassured and comforted.  NP notified.

## 2023-02-02 NOTE — ED Notes (Signed)
Patient is emotionally labile.  Patient continues to have suicidal and homicidal ideation and does not contract for safety.

## 2023-02-03 DIAGNOSIS — F315 Bipolar disorder, current episode depressed, severe, with psychotic features: Secondary | ICD-10-CM | POA: Diagnosis not present

## 2023-02-03 MED ORDER — PROPAFENONE HCL ER 225 MG PO CP12
450.0000 mg | ORAL_CAPSULE | Freq: Two times a day (BID) | ORAL | Status: DC
  Administered 2023-02-03 (×2): 450 mg via ORAL
  Filled 2023-02-03: qty 2
  Filled 2023-02-03 (×4): qty 28

## 2023-02-03 MED ORDER — BACITRACIN-NEOMYCIN-POLYMYXIN OINTMENT TUBE
TOPICAL_OINTMENT | Freq: Every day | CUTANEOUS | Status: DC
  Filled 2023-02-03: qty 14.17

## 2023-02-03 MED ORDER — LORAZEPAM 2 MG/ML IJ SOLN
2.0000 mg | Freq: Once | INTRAMUSCULAR | Status: AC
  Administered 2023-02-03: 2 mg via INTRAMUSCULAR
  Filled 2023-02-03: qty 1

## 2023-02-03 MED ORDER — IBUPROFEN 200 MG PO TABS
600.0000 mg | ORAL_TABLET | Freq: Once | ORAL | Status: AC
  Administered 2023-02-03: 600 mg via ORAL
  Filled 2023-02-03: qty 3

## 2023-02-03 MED ORDER — HYDROXYZINE HCL 25 MG PO TABS
25.0000 mg | ORAL_TABLET | Freq: Once | ORAL | Status: AC
  Administered 2023-02-03: 25 mg via ORAL
  Filled 2023-02-03: qty 1

## 2023-02-03 NOTE — ED Notes (Signed)
Pt asking for medication for a headache- MD aware

## 2023-02-03 NOTE — Progress Notes (Signed)
CSW submitted [facility] referral for review for admission. CSW will continue to monitor the patient to secure recommended disposition.    Damita Dunnings, MSW, LCSW-A  10:12 AM 02/03/2023

## 2023-02-03 NOTE — Progress Notes (Signed)
Prepared 7d supply of Propafenone 250mg  SR #28 to send with patient to [facility].  Santita Hunsberger S. Merilynn Finland, PharmD, BCPS Clinical Staff Pharmacist [email or web address]

## 2023-02-03 NOTE — ED Provider Notes (Signed)
Emergency Medicine Observation Re-evaluation Note  Andre Licht is a 32 y.o. female, seen on rounds today.  Pt initially presented to the ED for complaints of Suicidal Currently, the patient is talking to the psychiatry team and pacing through the hallway.  Physical Exam  BP 123/73 (BP Location: Left Arm)   Pulse (!) 115   Temp 98.1 F (36.7 C) (Oral)   Resp 18   SpO2 100%  Physical Exam General: Walking around without other physical complaints at this time Cardiac: No murmur on my exam Lungs: Clear to auscultation bilaterally Psych: Talking with psychiatry but does not appear to be in acute distress  ED Course / MDM  EKG:   I have reviewed the labs performed to date as well as medications administered while in observation.  Recent changes in the last 24 hours include none reported by nursing.  Plan  Current plan is for awaiting inpatient placement.    Analucia Hush, Canary Brim, MD 02/03/23 [phone]

## 2023-02-03 NOTE — Progress Notes (Signed)
[facility] LCSW Progress Note   02/03/2023    11:59 AM  Keleigh Stbernard   [id number]   Type of Contact and Topic:  Psychiatric Bed Placement   Pt accepted to [facility]     Patient meets inpatient criteria per Dahlia Byes, NP  The attending provider will be Dr. Loni Beckwith   Call report to [phone]  Hyman Bower, RN @ [facility] notified.     Pt scheduled  to arrive at [facility] AFTER 0900.    Damita Dunnings, MSW, LCSW-A  12:00 PM 02/03/2023

## 2023-02-03 NOTE — ED Notes (Addendum)
Pt continues to hit the Emergency button. RN asked pt "why do you keep hitting it". Pt stated "because I want to".

## 2023-02-03 NOTE — Progress Notes (Signed)
Patient has been denied by [facility] due to no appropriate beds available. Patient meets BH inpatient criteria per Dahlia Byes, NP. Patient has been faxed out to the following facilities:   [facility] [address] [place] [place] [address] [phone]  [facility] [address] [place] [address] [phone]  [facility] [address], [place] [address] [phone]  [facility] EFAX [address] [place], [place] [phone]  [facility] [address] [place] [address] [phone]  [facility] Patient Placement [facility], [place] [phone]  [facility] [address] [place] [address] [phone]  [facility] [place] [address] [phone]  [facility] [address], [place] [address] [phone]  [facility] [address] [place], [place] [address] [phone]  [facility] [address] [place] [address] [phone]  [facility] [address] [place] [address] [phone]  [facility] [address] [place] [address] [phone]  [facility] [address] [place] [address] [phone]  [facility] [address], [place] [address] [phone]  [facility] [address], [place] [address] [phone]   Damita Dunnings, MSW, LCSW-A  10:53 AM 02/03/2023

## 2023-02-03 NOTE — ED Notes (Signed)
Pt pulled Code Blue alarm, then started yelling, got up to stand on a chair and banged on the wall. Chair removed from area at this time, and pt redirected back to room.

## 2023-02-03 NOTE — ED Notes (Signed)
Pill bottle with 7 days worth of propafenone sitting next to pt chart at the desk- to be sent with pt to [facility].

## 2023-02-03 NOTE — ED Notes (Signed)
[facility] requesting pt be IVC prior to arrival, and will need 7 day supply of propafenone sent with her. Provider and pharmacy notified.

## 2023-02-03 NOTE — Consult Note (Signed)
[name] Psychiatric Consult Initial  Patient Name: .[name]  MRN: [id number]  DOB: September 29, 1990  Consult Order details:  Orders (From admission, onward)     Start     Ordered   02/02/23 0500  CONSULT TO CALL ACT TEAM       Ordering Provider: Elpidio Anis, PA-C  Provider:  (Not yet assigned)  Question:  Reason for Consult?  Answer:  Psych consult   02/02/23 0500             Mode of Visit: In person    Psychiatry Consult Evaluation  Service Date: February 03, 2023 LOS:  LOS: 0 days  Chief Complaint suicide, Homicidal ideation  Primary Psychiatric Diagnoses  Bipolar 1 disorder, current or most recent episode  Depresssed, severe with Psychotic features 2.  Suicide ideation  3.  Homicidal ideation  Assessment  [name] is a 32 y.o. female admitted: Presented to the EDfor 02/02/2023  3:21 AM for suicide and homicidal ideation. She carries the psychiatric diagnoses of Borderline Personality disorder, Bipolar disorder, Depressed with Psychotic  features, suicide attempts, GAD, PTSD and,  and has a past medical history of  Seizure, disorder, Migraine, Asthma and Fibromyalgia. Patient was seen today several times angry, banging on the walls crying stating her parents don't care about her especially her mother.  Patient asked Provider to call her father to see if her mother will accept and love her.  Patient presents irritable and labile mood needing redirecting.  She is compliant with her Medications.  Patient truly want to stay in any inpatient Psychiatry hospitalization for six months.  He admits taking her Medications as prescribed but also states they are not effective. This evening [facility] in [place] accepted patient for admission and they requested patient to be IVC before coming to avid her signing herself out before completing treatment.  Patient is now IVC.ed  Diagnoses:  Active Hospital problems: Principal Problem:   Bipolar I disorder, current or  most recent episode depressed, with psychotic features (HCC) Active Problems:   Borderline personality disorder (HCC)    Plan   ## Psychiatric Medication Recommendations:   Prazosin 1 mg po at bed time-Nightmares, Cymbalta DR, 90 mg po daily for depression.  Gabapentin 600 mg tid for mood, Olanzapine 15 mg po BID for Psychosis  ## Medical Decision Making Capacity:  Recommend inpatient Psychiatry Hospitalization. Resume home Medications -Already resumed Fax out records for bed placement if no available bed in [facility]. ## Further Work-up: TSH. -- most recent EKG on 02/03/2023 had QtC of 470 -- Pertinent labwork reviewed earlier this admission includes: CBC, CMP, UDS, Alcohol level, Tylenol level, Salicylate Level, Preg test   ## Disposition:-- We recommend inpatient psychiatric hospitalization after medical hospitalization. Patient has been involuntarily committed on 02/03/2023.   ## Behavioral / Environmental: - Patients with borderline personality traits/disorder often use the language of physical pain to communicate both physical and emotional suffering. It is important to address pain complaints as they arise and attempt to identify an etiology, either organic or psychiatric. In patients with chronic pain, it is important to have a discussion with the patient about expectations about pain control., Patient would benefit from more frequent contact with medical team to delineate plan of care and allow for clarification questions, which will help alleviate anxiety regarding treatment. If possible, try to check back in with the pt in the afternoon., To minimize splitting of staff, assign one staff person to communicate all information from the team when  feasible., or Utilize compassion and acknowledge the patient's experiences while setting clear and realistic expectations for care.    ## Safety and Observation Level:  - Based on my clinical evaluation, I estimate the patient to be at High  risk of  self harm in the current setting. - At this time, we recommend  routine. This decision is based on my review of the chart including patient's history and current presentation, interview of the patient, mental status examination, and consideration of suicide risk including evaluating suicidal ideation, plan, intent, suicidal or self-harm behaviors, risk factors, and protective factors. This judgment is based on our ability to directly address suicide risk, implement suicide prevention strategies, and develop a safety plan while the patient is in the clinical setting. Please contact our team if there is a concern that risk level has changed.  CSSR Risk Category:C-SSRS RISK CATEGORY: No Risk  Suicide Risk Assessment: Patient has following modifiable risk factors for suicide: active suicidal ideation, untreated depression, under treated depression , CAH with suicidal content, and recklessness, which we are addressing by recommending inpatient Psychiatry hospitalization, Close monitoring of patient.. Patient has following non-modifiable or demographic risk factors for suicide: separation or divorce, history of suicide attempt, history of self harm behavior, and psychiatric hospitalization Patient has the following protective factors against suicide: Access to outpatient mental health care  Thank you for this consult request. Recommendations have been communicated to the primary team.    Patient is accepted at [facility] in [place]. To be transported to [facility] in the morning. at this time.   Earney Navy, NP-PMHNP-BC       History of Present Illness  Relevant Aspects of Hospital ED Course:  [name] is a 32 y.o. female admitted: Presented to the EDfor 02/02/2023  3:21 AM for suicide and homicidal ideation. She carries the psychiatric diagnoses of Borderline Personality disorder, Bipolar disorder, Depressed with Psychotic  features, suicide attempts, GAD, PTSD and,  and has a past medical  history of  Seizure, disorder, Migraine, Asthma and Fibromyalgia. Patient was seen today several times angry, banging on the walls crying stating her parents don't care about her especially her mother.  Patient asked Provider to call her father to see if her mother will accept and love her.  Patient presents irritable and labile mood needing redirecting.  She is compliant with her Medications.  Patient truly want to stay in any inpatient Psychiatry hospitalization for six months.  He admits taking her Medications as prescribed but also states they are not effective. This evening [facility] in [place] accepted patient for admission and they requested patient to be IVC before coming to [facility] her signing herself out before completing treatment.  Patient is now IVC.ed and will be transported to [facility] tomorrow morning. Psych ROS:  Depression: YES Anxiety:  Yes Mania (lifetime and current): na Psychosis: (lifetime and current): yes  Collateral information:  None needed at this time.  Patient however does not have any person to contact.she says.  Review of Systems  Constitutional: Negative.   HENT: Negative.    Eyes: Negative.   Respiratory: Negative.    Cardiovascular: Negative.   Gastrointestinal: Negative.   Genitourinary: Negative.   Musculoskeletal: Negative.   Skin: Negative.   Neurological: Negative.   Endo/Heme/Allergies: Negative.   Psychiatric/Behavioral:  Positive for depression, hallucinations and suicidal ideas. The patient is nervous/anxious.      Psychiatric and Social History  Psychiatric History:  Information collected from patient  Prev Dx/Sx: Borderline Personality disorder,  Bipolar disorder, Depressed with Psychotic  features, suicide attempts, GAD, PTSD Current Psych Provider: Sallyanne Kuster of life Psychiatrist Home Meds (current): See above-Prazosin 1 mg po at bed time-Nightmares, Cymbalta DR, 90 mg po daily for depression.  Gabapentin 600 mg tid for mood, Olanzapine 15 mg po BID  for Psychosis Previous Med Trials: Haldol, Vrylar, Aripirazole, Risperidone,  Therapy: yes  Prior Psych Hospitalization: Multiple times  Prior Self Harm: yes Prior Violence: yes  Family Psych History: Yes Family Hx suicide: denies  Social History:  Developmental Hx: Asperger's disease,  Educational Hx: Associate two  years degree Occupational Hx: none, unemployed Armed forces operational officer Hx: denies Living Situation: apartment Spiritual Hx: denies Access to weapons/lethal means: no   Substance History Alcohol: Denies  Type of alcohol na Last Drink na Number of drinks per day denies History of alcohol withdrawal seizures denies History of DT's denies Tobacco: denies Illicit drugs: denies-UDS Negative Prescription drug abuse: denies Rehab hx: denies  Exam Findings  Physical Exam:  Vital Signs:  Temp:  [98.1 F (36.7 C)-98.4 F (36.9 C)] 98.4 F (36.9 C) (12/29 1403) Pulse Rate:  [104-115] 112 (12/29 1403) Resp:  [18-20] 20 (12/29 1403) BP: (123-144)/(71-81) 131/71 (12/29 1403) SpO2:  [100 %] 100 % (12/29 1403) Blood pressure 131/71, pulse (!) 112, temperature 98.4 F (36.9 C), temperature source Oral, resp. rate 20, SpO2 100%. There is no height or weight on file to calculate BMI.  Physical Exam Constitutional:      Appearance: She is obese.  HENT:     Nose: Nose normal.  Cardiovascular:     Rate and Rhythm: Tachycardia present.  Pulmonary:     Effort: Pulmonary effort is normal.  Musculoskeletal:        General: Normal range of motion.  Skin:    General: Skin is dry.  Neurological:     Mental Status: She is alert and oriented to person, place, and time.  Psychiatric:        Attention and Perception: Attention normal. She perceives auditory hallucinations.        Mood and Affect: Mood is anxious and depressed. Affect is angry.        Speech: Speech normal.        Behavior: Behavior normal. Behavior is cooperative.        Thought Content: Thought content includes homicidal  and suicidal ideation. Thought content includes suicidal plan.        Cognition and Memory: Cognition and memory normal.        Judgment: Judgment is impulsive.     Mental Status Exam: General Appearance: Casual, Neat, and Obese  Orientation:  Full (Time, Place, and Person)  Memory:  Immediate;   Good Recent;   Good Remote;   Good  Concentration:  Concentration: Good and Attention Span: Good  Recall:  Good  Attention  Good  Eye Contact:  Minimal  Speech:  Clear and Coherent  Language:  Good  Volume:  Normal  Mood: Depressed, angry, Labile  Affect:  Congruent  Thought Process:  Coherent  Thought Content:  Logical  Suicidal Thoughts:  Yes.  with intent/plan  Homicidal Thoughts:  Yes.  without intent/plan  Judgement:  Poor  Insight:  Fair  Psychomotor Activity:  Decreased  Akathisia:  NA  Fund of Knowledge:  Good      Assets:  Communication Skills Desire for Improvement Housing Social Support  Cognition:  WNL  ADL's:  Intact  AIMS (if indicated):        Other History  These have been pulled in through the EMR, reviewed, and updated if appropriate.  Family History:  The patient's family history includes Asthma in her father; Mental illness in her father; PDD in her brother; Seizures in her brother.  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    Chronic constipation    Depression    Drug-seeking behavior    Essential tremor    Headache    Ineffective individual coping 05/16/2022   Insomnia 01/12/2022   Intentional drug overdose (HCC) 06/05/2022   Neuromuscular disorder (HCC)    Normocytic anemia 06/05/2022   Overdose 07/22/2017    Overdose of acetaminophen 07/2017   and other meds   Overdose, intentional self-harm, initial encounter (HCC) 07/20/2021   Paranoia (HCC) 04/22/2021   Personality disorder (HCC)    Prolonged QT interval    Purposeful non-suicidal drug ingestion (HCC) 06/27/2021   Schizo-affective psychosis (HCC)    Schizoaffective disorder (HCC) 07/29/2022   Schizoaffective disorder, bipolar type (HCC)    Seizures (HCC)    Last seizure December 2017   Skin erythema 04/27/2022   Sleep apnea    Suicidal behavior 07/25/2021   Suicidal ideation    Suicide (HCC) 07/01/2021   Suicide attempt (HCC) 07/04/2021    Surgical History: Past Surgical History:  Procedure Laterality Date   MOUTH SURGERY  2009 or 2010     Medications:   Current Facility-Administered Medications:    albuterol (VENTOLIN HFA) 108 (90 Base) MCG/ACT inhaler 1-2 puff, 1-2 puff, Inhalation, Q6H PRN, Long, Arlyss Repress, MD   cyclobenzaprine (FLEXERIL) tablet 10 mg, 10 mg, Oral, QHS, Upstill, Shari, PA-C, 10 mg at 02/02/23 2121   diclofenac Sodium (VOLTAREN) 1 % topical gel 2 g, 2 g, Topical, QID PRN, Long, Arlyss Repress, MD, 2 g at 02/02/23 0907   DULoxetine (CYMBALTA) DR capsule 90 mg, 90 mg, Oral, Daily, Upstill, Shari, PA-C, 90 mg at 02/03/23 0940   gabapentin (NEURONTIN) capsule 600 mg, 600 mg, Oral, TID, Upstill, Shari, PA-C, 600 mg at 02/03/23 1506   metFORMIN (GLUCOPHAGE) tablet 500 mg, 500 mg, Oral, Q breakfast, Tanda Rockers A, DO, 500 mg at 02/03/23 0921   OLANZapine (ZYPREXA) tablet 15 mg, 15 mg, Oral, BID, Upstill, Shari, PA-C, 15 mg at 02/03/23 2841   pantoprazole (PROTONIX) EC tablet 40 mg, 40 mg, Oral, QAC breakfast, Upstill, Shari, PA-C, 40 mg at 02/03/23 0921   prazosin (MINIPRESS) capsule 1 mg, 1 mg, Oral, QHS, Upstill, Shari, PA-C, 1 mg at 02/02/23 2122   propafenone (RYTHMOL SR) 12 hr capsule 450 mg, 450 mg, Oral, Q12H, Tegeler, Canary Brim, MD, 450 mg at 02/03/23 1159  Current Outpatient Medications:    albuterol  (PROVENTIL) (2.5 MG/3ML) 0.083% nebulizer solution, Take 3 mLs (2.5 mg total) by nebulization every 6 (six) hours as needed for wheezing or shortness of breath., Disp: 75 mL, Rfl: 0   cyclobenzaprine (FLEXERIL) 10 MG tablet, Take 10 mg by mouth at bedtime., Disp: , Rfl:    diclofenac Sodium (VOLTAREN) 1 % GEL, Apply 4 g topically 4 (four) times daily as needed (pain)., Disp: 4 g, Rfl: 0   DULoxetine (CYMBALTA) 30  MG capsule, Take 3 capsules (90 mg total) by mouth daily., Disp: 90 capsule, Rfl: 0   gabapentin (NEURONTIN) 600 MG tablet, Take 600 mg by mouth 3 (three) times daily., Disp: , Rfl:    hydrOXYzine (ATARAX) 25 MG tablet, Take 1 tablet (25 mg total) by mouth 3 (three) times daily as needed for anxiety., Disp: 30 tablet, Rfl: 0   melatonin 3 MG TABS tablet, Take 1 tablet (3 mg total) by mouth at bedtime., Disp: 30 tablet, Rfl: 0   metFORMIN (GLUCOPHAGE) 500 MG tablet, Take 500 mg by mouth daily., Disp: , Rfl:    OLANZapine (ZYPREXA) 15 MG tablet, Take 1 tablet (15 mg total) by mouth 2 (two) times daily., Disp: 60 tablet, Rfl: 0   ondansetron (ZOFRAN-ODT) 4 MG disintegrating tablet, Take 1 tablet (4 mg total) by mouth every 8 (eight) hours as needed for nausea or vomiting., Disp: 10 tablet, Rfl: 0   pantoprazole (PROTONIX) 40 MG tablet, Take 1 tablet (40 mg total) by mouth daily before breakfast., Disp: 30 tablet, Rfl: 0   prazosin (MINIPRESS) 1 MG capsule, Take 1 capsule (1 mg total) by mouth at bedtime., Disp: 30 capsule, Rfl: 0   propafenone (RYTHMOL SR) 425 MG 12 hr capsule, Take 425 mg by mouth 2 (two) times daily., Disp: , Rfl:    SYMBICORT 160-4.5 MCG/ACT inhaler, Inhale 2 puffs into the lungs in the morning and at bedtime., Disp: , Rfl:    mometasone-formoterol (DULERA) 200-5 MCG/ACT AERO, Inhale 2 puffs into the lungs 2 (two) times daily. (Patient not taking: Reported on 02/02/2023), Disp: 1 each, Rfl: 0  Allergies: Allergies  Allergen Reactions   Bee Venom Anaphylaxis   Coconut  Flavoring Agent (Non-Screening) Anaphylaxis and Rash   Fish Allergy Anaphylaxis   Geodon [Ziprasidone Hcl] Other (See Comments)    Pt states that this medication causes paralysis of the mouth.     Haloperidol And Related Other (See Comments)    Pt states that this medication causes paralysis of the mouth, jaw locks up   Lithobid [Lithium] Other (See Comments)    Seizure-like activity    Roxicodone [Oxycodone] Other (See Comments)    Hallucinations    Seroquel [Quetiapine] Other (See Comments)    Severe drowsiness    Shellfish Allergy Anaphylaxis   Phenergan [Promethazine Hcl] Other (See Comments)    Chest pain     Prilosec [Omeprazole] Nausea And Vomiting and Other (See Comments)    Pt can take protonix with no problems    Sulfa Antibiotics Other (See Comments)    Chest pain    Tegretol [Carbamazepine] Nausea And Vomiting   Prozac [Fluoxetine] Other (See Comments)    Increased Depression and Suicidal thoughts   Tape Other (See Comments)    Skin tears, can only tolerate paper tape.   Tylenol [Acetaminophen] Rash and Other (See Comments)    Rash on face     Earney Navy, NP-PMHNP-BC

## 2023-02-04 DIAGNOSIS — F315 Bipolar disorder, current episode depressed, severe, with psychotic features: Secondary | ICD-10-CM | POA: Diagnosis not present

## 2023-02-04 MED ORDER — DIPHENHYDRAMINE HCL 50 MG/ML IJ SOLN
50.0000 mg | Freq: Once | INTRAMUSCULAR | Status: AC
  Administered 2023-02-04: 50 mg via INTRAMUSCULAR
  Filled 2023-02-04: qty 1

## 2023-02-04 MED ORDER — PROPAFENONE HCL ER 225 MG PO CP12
450.0000 mg | ORAL_CAPSULE | Freq: Two times a day (BID) | ORAL | Status: DC
  Administered 2023-02-04: 450 mg via ORAL
  Filled 2023-02-04: qty 2

## 2023-02-04 NOTE — ED Notes (Signed)
Patient off unit to [facility] per provider. Patient alert, cooperative, no s/s of distress at this time. Patient discharge information and belongings given to sheriff for transport. Patient ambulatory off unit, escorted and transported by [facility].

## 2023-02-04 NOTE — ED Provider Notes (Signed)
Patient repeatedly hitting emergency button.  She appears to be doing it in a defiant fashion.  Does not appear acutely psychotic.  Nursing requesting restraints which I have declined; however, patient given a dose of IM Benadryl for sleep.   Shon Baton, MD 02/04/23 0001

## 2023-02-04 NOTE — ED Notes (Addendum)
Pt continues to hit the emergency button and stated "she's going to kill everybody". Provider made aware.

## 2023-02-04 NOTE — ED Provider Notes (Signed)
Emergency Medicine Observation Re-evaluation Note  [name] is a 32 y.o. female, seen on rounds today.  Pt initially presented to the ED for complaints of Suicidal Currently, the patient is awaiting placement.  Physical Exam  BP (!) 117/52 (BP Location: Left Arm)   Pulse (!) 105   Temp 98.2 F (36.8 C) (Oral)   Resp 18   SpO2 98%  Physical Exam General: NAD Cardiac: RR Lungs: even unlabored Psych: calm  ED Course / MDM  EKG:EKG Interpretation Date/Time:  Sunday February 03 2023 09:08:25 EST Ventricular Rate:  104 PR Interval:  142 QRS Duration:  90 QT Interval:  358 QTC Calculation: 470 R Axis:   53  Text Interpretation: Sinus tachycardia Otherwise normal ECG No significant change since last tracing Confirmed by Elayne Snare ([id number]) on 02/03/2023 3:32:21 PM  I have reviewed the labs performed to date as well as medications administered while in observation.  Recent changes in the last 24 hours include none.  Plan  Current plan is for transfer to [facility]. Assessed prior and transferred in stable condition.    Alvira Monday, MD 02/04/23 2340

## 2023-02-17 ENCOUNTER — Other Ambulatory Visit: Payer: Self-pay

## 2023-02-17 ENCOUNTER — Encounter (HOSPITAL_COMMUNITY): Payer: Self-pay | Admitting: Emergency Medicine

## 2023-02-17 ENCOUNTER — Observation Stay (HOSPITAL_COMMUNITY)
Admission: EM | Admit: 2023-02-17 | Discharge: 2023-02-21 | Disposition: A | Discharge status: Other Acute Inpatient Hospital | Payer: Medicare PPO | Attending: Internal Medicine | Admitting: Internal Medicine

## 2023-02-17 DIAGNOSIS — T50902A Poisoning by unspecified drugs, medicaments and biological substances, intentional self-harm, initial encounter: Principal | ICD-10-CM | POA: Diagnosis present

## 2023-02-17 DIAGNOSIS — F319 Bipolar disorder, unspecified: Secondary | ICD-10-CM | POA: Diagnosis present

## 2023-02-17 DIAGNOSIS — I4581 Long QT syndrome: Secondary | ICD-10-CM | POA: Diagnosis not present

## 2023-02-17 DIAGNOSIS — U071 COVID-19: Secondary | ICD-10-CM | POA: Insufficient documentation

## 2023-02-17 DIAGNOSIS — D638 Anemia in other chronic diseases classified elsewhere: Secondary | ICD-10-CM | POA: Diagnosis not present

## 2023-02-17 DIAGNOSIS — F332 Major depressive disorder, recurrent severe without psychotic features: Secondary | ICD-10-CM

## 2023-02-17 DIAGNOSIS — I498 Other specified cardiac arrhythmias: Secondary | ICD-10-CM | POA: Insufficient documentation

## 2023-02-17 DIAGNOSIS — K219 Gastro-esophageal reflux disease without esophagitis: Secondary | ICD-10-CM | POA: Diagnosis not present

## 2023-02-17 DIAGNOSIS — Z87891 Personal history of nicotine dependence: Secondary | ICD-10-CM | POA: Diagnosis not present

## 2023-02-17 DIAGNOSIS — R45851 Suicidal ideations: Principal | ICD-10-CM | POA: Insufficient documentation

## 2023-02-17 DIAGNOSIS — F3112 Bipolar disorder, current episode manic without psychotic features, moderate: Secondary | ICD-10-CM | POA: Diagnosis not present

## 2023-02-17 DIAGNOSIS — F4323 Adjustment disorder with mixed anxiety and depressed mood: Secondary | ICD-10-CM | POA: Diagnosis not present

## 2023-02-17 DIAGNOSIS — D649 Anemia, unspecified: Secondary | ICD-10-CM | POA: Diagnosis not present

## 2023-02-17 DIAGNOSIS — F603 Borderline personality disorder: Secondary | ICD-10-CM | POA: Diagnosis not present

## 2023-02-17 DIAGNOSIS — E872 Acidosis, unspecified: Secondary | ICD-10-CM | POA: Diagnosis not present

## 2023-02-17 DIAGNOSIS — I480 Paroxysmal atrial fibrillation: Secondary | ICD-10-CM | POA: Diagnosis not present

## 2023-02-17 DIAGNOSIS — Z7984 Long term (current) use of oral hypoglycemic drugs: Secondary | ICD-10-CM | POA: Diagnosis not present

## 2023-02-17 DIAGNOSIS — F250 Schizoaffective disorder, bipolar type: Secondary | ICD-10-CM | POA: Diagnosis present

## 2023-02-17 DIAGNOSIS — I459 Conduction disorder, unspecified: Secondary | ICD-10-CM | POA: Diagnosis present

## 2023-02-17 DIAGNOSIS — R9431 Abnormal electrocardiogram [ECG] [EKG]: Secondary | ICD-10-CM | POA: Diagnosis present

## 2023-02-17 DIAGNOSIS — R7303 Prediabetes: Secondary | ICD-10-CM | POA: Diagnosis not present

## 2023-02-17 DIAGNOSIS — Z79899 Other long term (current) drug therapy: Secondary | ICD-10-CM | POA: Insufficient documentation

## 2023-02-17 DIAGNOSIS — J4540 Moderate persistent asthma, uncomplicated: Secondary | ICD-10-CM | POA: Diagnosis present

## 2023-02-17 DIAGNOSIS — T450X2A Poisoning by antiallergic and antiemetic drugs, intentional self-harm, initial encounter: Secondary | ICD-10-CM | POA: Insufficient documentation

## 2023-02-17 HISTORY — DX: Paroxysmal atrial fibrillation: I48.0

## 2023-02-17 LAB — HCG, SERUM, QUALITATIVE: Preg, Serum: NEGATIVE

## 2023-02-17 LAB — BASIC METABOLIC PANEL
Anion gap: 4 — ABNORMAL LOW (ref 5–15)
Anion gap: 6 (ref 5–15)
BUN: 6 mg/dL (ref 6–20)
BUN: 7 mg/dL (ref 6–20)
CO2: 19 mmol/L — ABNORMAL LOW (ref 22–32)
CO2: 21 mmol/L — ABNORMAL LOW (ref 22–32)
Calcium: 7.4 mg/dL — ABNORMAL LOW (ref 8.9–10.3)
Calcium: 9 mg/dL (ref 8.9–10.3)
Chloride: 114 mmol/L — ABNORMAL HIGH (ref 98–111)
Chloride: 109 mmol/L (ref 98–111)
Creatinine, Ser: 0.6 mg/dL (ref 0.44–1.00)
Creatinine, Ser: 0.58 mg/dL (ref 0.44–1.00)
GFR, Estimated: >60 mL/min (ref >60)
GFR, Estimated: >60 mL/min (ref >60)
Glucose, Bld: 87 mg/dL (ref 70–99)
Glucose, Bld: 99 mg/dL (ref 70–99)
Potassium: 3.6 mmol/L (ref 3.5–5.1)
Potassium: 4.4 mmol/L (ref 3.5–5.1)
Sodium: 137 mmol/L (ref 135–145)
Sodium: 136 mmol/L (ref 135–145)

## 2023-02-17 LAB — CBC
HCT: 33.8 % — ABNORMAL LOW (ref 36.0–46.0)
Hemoglobin: 10.1 g/dL — ABNORMAL LOW (ref 12.0–15.0)
MCH: 25 pg — ABNORMAL LOW (ref 26.0–34.0)
MCHC: 29.9 g/dL — ABNORMAL LOW (ref 30.0–36.0)
MCV: 83.7 fL (ref 80.0–100.0)
Platelets: 261 10*3/uL (ref 150–400)
RBC: 4.04 MIL/uL (ref 3.87–5.11)
RDW: 14.2 % (ref 11.5–15.5)
WBC: 8.9 10*3/uL (ref 4.0–10.5)
nRBC: 0 % (ref 0.0–0.2)

## 2023-02-17 LAB — RAPID URINE DRUG SCREEN, HOSP PERFORMED
Amphetamines: NOT DETECTED
Barbiturates: NOT DETECTED
Benzodiazepines: NOT DETECTED
Cocaine: NOT DETECTED
Opiates: NOT DETECTED
Tetrahydrocannabinol: NOT DETECTED

## 2023-02-17 LAB — SALICYLATE LEVEL: Salicylate Lvl: <7 mg/dL — ABNORMAL LOW (ref 7.0–30.0)

## 2023-02-17 LAB — COMPREHENSIVE METABOLIC PANEL
ALT: 19 U/L (ref 0–44)
AST: 15 U/L (ref 15–41)
Albumin: 3.9 g/dL (ref 3.5–5.0)
Alkaline Phosphatase: 89 U/L (ref 38–126)
Anion gap: 9 (ref 5–15)
BUN: 6 mg/dL (ref 6–20)
CO2: 23 mmol/L (ref 22–32)
Calcium: 8.9 mg/dL (ref 8.9–10.3)
Chloride: 105 mmol/L (ref 98–111)
Creatinine, Ser: 0.85 mg/dL (ref 0.44–1.00)
GFR, Estimated: >60 mL/min (ref >60)
Glucose, Bld: 113 mg/dL — ABNORMAL HIGH (ref 70–99)
Potassium: 3.7 mmol/L (ref 3.5–5.1)
Sodium: 137 mmol/L (ref 135–145)
Total Bilirubin: 0.5 mg/dL (ref 0.0–1.2)
Total Protein: 7.2 g/dL (ref 6.5–8.1)

## 2023-02-17 LAB — I-STAT CG4 LACTIC ACID, ED: Lactic Acid, Venous: 2 mmol/L (ref 0.5–1.9)

## 2023-02-17 LAB — MAGNESIUM
Magnesium: 1.9 mg/dL (ref 1.7–2.4)
Magnesium: 2.1 mg/dL (ref 1.7–2.4)

## 2023-02-17 LAB — ETHANOL: Alcohol, Ethyl (B): <10 mg/dL (ref <10)

## 2023-02-17 LAB — ACETAMINOPHEN LEVEL: Acetaminophen (Tylenol), Serum: <10 ug/mL — ABNORMAL LOW (ref 10–30)

## 2023-02-17 MED ORDER — LORAZEPAM 2 MG/ML IJ SOLN
0.5000 mg | Freq: Four times a day (QID) | INTRAMUSCULAR | Status: DC | PRN
  Administered 2023-02-18: 0.5 mg via INTRAVENOUS
  Filled 2023-02-17: qty 1

## 2023-02-17 MED ORDER — SODIUM CHLORIDE 0.9 % IV BOLUS
1000.0000 mL | Freq: Once | INTRAVENOUS | Status: AC
  Administered 2023-02-17: 1000 mL via INTRAVENOUS

## 2023-02-17 MED ORDER — POTASSIUM CHLORIDE CRYS ER 20 MEQ PO TBCR
40.0000 meq | EXTENDED_RELEASE_TABLET | Freq: Once | ORAL | Status: AC
  Administered 2023-02-17: 40 meq via ORAL
  Filled 2023-02-17: qty 4

## 2023-02-17 MED ORDER — METFORMIN HCL 500 MG PO TABS
500.0000 mg | ORAL_TABLET | Freq: Every day | ORAL | Status: DC
  Administered 2023-02-17: 500 mg via ORAL
  Filled 2023-02-17: qty 1

## 2023-02-17 MED ORDER — POTASSIUM CHLORIDE CRYS ER 20 MEQ PO TBCR
40.0000 meq | EXTENDED_RELEASE_TABLET | Freq: Once | ORAL | Status: AC
  Administered 2023-02-17: 40 meq via ORAL
  Filled 2023-02-17: qty 2

## 2023-02-17 MED ORDER — SODIUM BICARBONATE 8.4 % IV SOLN
50.0000 meq | Freq: Once | INTRAVENOUS | Status: DC
  Filled 2023-02-17: qty 50

## 2023-02-17 MED ORDER — PRAZOSIN HCL 1 MG PO CAPS
2.0000 mg | ORAL_CAPSULE | Freq: Every day | ORAL | Status: DC
  Administered 2023-02-17 – 2023-02-20 (×4): 2 mg via ORAL
  Filled 2023-02-17 (×5): qty 2

## 2023-02-17 MED ORDER — GABAPENTIN 300 MG PO CAPS
600.0000 mg | ORAL_CAPSULE | Freq: Three times a day (TID) | ORAL | Status: DC
  Administered 2023-02-17 – 2023-02-21 (×12): 600 mg via ORAL
  Filled 2023-02-17 (×12): qty 2

## 2023-02-17 MED ORDER — MAGNESIUM SULFATE IN D5W 1-5 GM/100ML-% IV SOLN
1.0000 g | Freq: Once | INTRAVENOUS | Status: AC
  Administered 2023-02-17: 1 g via INTRAVENOUS
  Filled 2023-02-17: qty 100

## 2023-02-17 MED ORDER — DULOXETINE HCL 30 MG PO CPEP
90.0000 mg | ORAL_CAPSULE | Freq: Every day | ORAL | Status: DC
  Administered 2023-02-17: 90 mg via ORAL
  Filled 2023-02-17: qty 3

## 2023-02-17 MED ORDER — LORAZEPAM 0.5 MG PO TABS
0.5000 mg | ORAL_TABLET | Freq: Once | ORAL | Status: AC
  Administered 2023-02-17: 0.5 mg via ORAL
  Filled 2023-02-17: qty 1

## 2023-02-17 MED ORDER — PANTOPRAZOLE SODIUM 40 MG PO TBEC
40.0000 mg | DELAYED_RELEASE_TABLET | Freq: Every day | ORAL | Status: DC
  Administered 2023-02-18 – 2023-02-21 (×4): 40 mg via ORAL
  Filled 2023-02-17 (×4): qty 1

## 2023-02-17 NOTE — ED Provider Notes (Addendum)
  Physical Exam  BP 121/66   Pulse 98   Temp 97.9 F (36.6 C) (Oral)   Resp 14   SpO2 97%   Physical Exam  Procedures  .Critical Care  Performed by: Charlyn Sora, MD Authorized by: Charlyn Sora, MD   Critical care provider statement:    Critical care time (minutes):  32   Critical care was necessary to treat or prevent imminent or life-threatening deterioration of the following conditions:  Metabolic crisis   Critical care was time spent personally by me on the following activities:  Development of treatment plan with patient or surrogate, discussions with consultants, evaluation of patient's response to treatment, examination of patient, ordering and review of laboratory studies, ordering and review of radiographic studies, ordering and performing treatments and interventions, pulse oximetry, re-evaluation of patient's condition and review of old charts   ED Course / MDM    Medical Decision Making Amount and/or Complexity of Data Reviewed Labs: ordered.  Risk Prescription drug management.   Pt comes in with cc of 4 days worth of her meds. Poison Control Recommends 12 hour monitoring. Goal is to ensure Mag is close to 2.  She has had no change at all in her vitals and remains stable. Questioning if she actually did overdose.  We will likely consult psych with med clearance pending until noon.    EKG Interpretation Date/Time:  Sunday February 17 2023 09:50:13 EST Ventricular Rate:  96 PR Interval:  192 QRS Duration:  130 QT Interval:  420 QTC Calculation: 531 R Axis:   108  Text Interpretation: Sinus rhythm RBBB and LPFB prolonged QT has worsened Confirmed by Charlyn, Bernetha Anschutz ([id number]) on 02/17/2023 11:26:06 AM        1:08 PM Patient last EKG reveals QRS of 130, QTc at 530. Repeat labs indicated potassium of 3.7, magnesium  of 2.1. Magnesium  optimized, but potassium is still slightly low.  Oral potassium ordered. Repeat EKG also ordered.  Reassessment: After  40 mEq of potassium, repeat EKG shows no changes. She is receiving another round of 40 mEq of potassium at 4 PM.  Repeat labs for 8 PM along with repeat EKG ordered.  At 14 hours postingestion, I feel comfortable clearing patient medically for psychiatric evaluation.  I ordered her home meds.  We will hold Rythmol , Risperdal , Atarax , Zofran  -all they can make prolonged QTc worse.  We will initiate some of the other medication that also theoretically can cause QT prolongation, but not as severe.  Charlyn Sora, MD 02/17/23 1551    Charlyn Sora, MD 02/17/23 1606

## 2023-02-17 NOTE — ED Notes (Signed)
 Poison control recommends monitoring the QRS and Qtcb due to prolongation

## 2023-02-17 NOTE — ED Provider Notes (Addendum)
 [place] [facility] AT [facility] Provider Note   CSN: [id number] Arrival date & time: 02/17/23  0210     History  Chief Complaint  Patient presents with   Suicidal    [name] is a 33 y.o. female.  Presents to the emergency department stating that she is suicidal and she took an overdose of her prescription medications tonight.       Home Medications Prior to Admission medications   Medication Sig Start Date End Date Taking? Authorizing Provider  albuterol  (PROVENTIL ) (2.5 MG/3ML) 0.083% nebulizer solution Take 3 mLs (2.5 mg total) by nebulization every 6 (six) hours as needed for wheezing or shortness of breath. 01/15/23  Yes Tex Drilling, NP  cyclobenzaprine  (FLEXERIL ) 10 MG tablet Take 10 mg by mouth at bedtime. 01/24/23  Yes [provider]  diclofenac  Sodium (VOLTAREN ) 1 % GEL Apply 4 g topically 4 (four) times daily as needed (pain). 01/15/23  Yes Tex Drilling, NP  DULoxetine  (CYMBALTA ) 30 MG capsule Take 3 capsules (90 mg total) by mouth daily. 01/16/23  Yes Tex Drilling, NP  gabapentin  (NEURONTIN ) 600 MG tablet Take 600 mg by mouth 3 (three) times daily. 01/24/23  Yes [provider]  hydrOXYzine  (ATARAX ) 25 MG tablet Take 1 tablet (25 mg total) by mouth 3 (three) times daily as needed for anxiety. 01/15/23  Yes Nkwenti, Doris, NP  melatonin 3 MG TABS tablet Take 1 tablet (3 mg total) by mouth at bedtime. Patient taking differently: Take 3 mg by mouth at bedtime as needed (As needed for sleep). 01/15/23  Yes Tex Drilling, NP  metFORMIN  (GLUCOPHAGE ) 500 MG tablet Take 500 mg by mouth daily. 01/24/23  Yes [provider]  ondansetron  (ZOFRAN -ODT) 4 MG disintegrating tablet Take 1 tablet (4 mg total) by mouth every 8 (eight) hours as needed for nausea or vomiting. 12/17/22  Yes Vicky Charleston, PA-C  pantoprazole  (PROTONIX ) 40 MG tablet Take 1 tablet (40 mg total) by mouth daily before breakfast.  01/15/23 02/17/23 Yes Nkwenti, Doris, NP  prazosin  (MINIPRESS ) 2 MG capsule Take 2 mg by mouth at bedtime. 02/13/23  Yes [provider]  propafenone  (RYTHMOL  SR) 425 MG 12 hr capsule Take 425 mg by mouth 2 (two) times daily. 01/24/23  Yes [provider]  risperiDONE  (RISPERDAL ) 2 MG tablet Take 2 mg by mouth 2 (two) times daily. 02/13/23  Yes [provider]  SYMBICORT 160-4.5 MCG/ACT inhaler Inhale 2 puffs into the lungs in the morning and at bedtime. 01/24/23  Yes [provider]  mometasone -formoterol  (DULERA ) 200-5 MCG/ACT AERO Inhale 2 puffs into the lungs 2 (two) times daily. Patient not taking: Reported on 02/02/2023 01/15/23   Tex Drilling, NP  OLANZapine  (ZYPREXA ) 15 MG tablet Take 1 tablet (15 mg total) by mouth 2 (two) times daily. Patient not taking: Reported on 02/17/2023 01/15/23   Tex Drilling, NP  propafenone  (RYTHMOL  SR) 225 MG 12 hr capsule Take by mouth. Patient not taking: Reported on 02/17/2023 02/11/23   [provider]      Allergies    Bee venom, Coconut flavoring agent (non-screening), Fish allergy, Geodon  [ziprasidone  hcl], Haloperidol  and related, Lithobid [lithium ], Roxicodone  [oxycodone ], Seroquel  [quetiapine ], Shellfish allergy, Phenergan  [promethazine  hcl], Prilosec [omeprazole], Sulfa antibiotics, Tegretol  [carbamazepine ], Prozac  [fluoxetine ], Tape, and Tylenol  [acetaminophen ]    Review of Systems   Review of Systems  Physical Exam Updated Vital Signs BP 107/71   Pulse (!) 116   Temp 98.5 F (36.9 C) (Oral)   Resp 20  SpO2 99%  Physical Exam Vitals and nursing note reviewed.  Constitutional:      General: She is not in acute distress.    Appearance: She is well-developed.  HENT:     Head: Normocephalic and atraumatic.     Mouth/Throat:     Mouth: Mucous membranes are moist.  Eyes:     General: Vision grossly intact. Gaze aligned appropriately.     Extraocular Movements: Extraocular movements intact.      Conjunctiva/sclera: Conjunctivae normal.  Cardiovascular:     Rate and Rhythm: Normal rate and regular rhythm.     Pulses: Normal pulses.     Heart sounds: Normal heart sounds, S1 normal and S2 normal. No murmur heard.    No friction rub. No gallop.  Pulmonary:     Effort: Pulmonary effort is normal. No respiratory distress.     Breath sounds: Normal breath sounds.  Abdominal:     General: Bowel sounds are normal.     Palpations: Abdomen is soft.     Tenderness: There is no abdominal tenderness. There is no guarding or rebound.     Hernia: No hernia is present.  Musculoskeletal:        General: No swelling.     Cervical back: Full passive range of motion without pain, normal range of motion and neck supple. No spinous process tenderness or muscular tenderness. Normal range of motion.     Right lower leg: No edema.     Left lower leg: No edema.  Skin:    General: Skin is warm and dry.     Capillary Refill: Capillary refill takes less than 2 seconds.     Findings: No ecchymosis, erythema, rash or wound.  Neurological:     General: No focal deficit present.     Mental Status: She is alert and oriented to person, place, and time.     GCS: GCS eye subscore is 4. GCS verbal subscore is 5. GCS motor subscore is 6.     Cranial Nerves: Cranial nerves 2-12 are intact.     Sensory: Sensation is intact.     Motor: Motor function is intact.     Coordination: Coordination is intact.  Psychiatric:        Attention and Perception: Attention normal.        Mood and Affect: Mood is depressed. Affect is tearful.        Speech: Speech normal.        Behavior: Behavior normal.        Thought Content: Thought content includes suicidal ideation.     ED Results / Procedures / Treatments   Labs (all labs ordered are listed, but only abnormal results are displayed) Labs Reviewed  COMPREHENSIVE METABOLIC PANEL - Abnormal; Notable for the following components:      Result Value   Glucose, Bld 113  (*)    All other components within normal limits  ACETAMINOPHEN  LEVEL - Abnormal; Notable for the following components:   Acetaminophen  (Tylenol ), Serum <10 (*)    All other components within normal limits  SALICYLATE LEVEL - Abnormal; Notable for the following components:   Salicylate Lvl <7.0 (*)    All other components within normal limits  I-STAT CG4 LACTIC ACID, ED - Abnormal; Notable for the following components:   Lactic Acid, Venous 2.0 (*)    All other components within normal limits  RAPID URINE DRUG SCREEN, HOSP PERFORMED  HCG, SERUM, QUALITATIVE  MAGNESIUM   ETHANOL  CBC  EKG EKG Interpretation Date/Time:  Sunday February 17 2023 02:44:44 EST Ventricular Rate:  117 PR Interval:  149 QRS Duration:  102 QT Interval:  354 QTC Calculation: 494 R Axis:   131  Text Interpretation: Sinus tachycardia Inferior infarct, old Anterolateral Q wave, probably normal for age Confirmed by Haze Lonni PARAS [phone]) on 02/17/2023 2:56:27 AM  Radiology No results found.  Procedures Procedures    Medications Ordered in ED Medications  sodium chloride  0.9 % bolus 1,000 mL (1,000 mLs Intravenous New Bag/Given 02/17/23 0551)    ED Course/ Medical Decision Making/ A&P                                 Medical Decision Making Amount and/or Complexity of Data Reviewed Labs: ordered.   Presents after overdose of multiple prescription medications.  Patient awake and alert at arrival.    POISON CONTROL 12 hour minimum on cardiac monitor. Repeat EKG Q 4 hrs. QTC- optimize potassium to 4.2 and magnesium  to 2.2 Any QRS >120 needs bicarb boluses to improve  Metformin  can cause lactic acidosis but unlikely with dose. Check 1 lactic and BMP now and another in 4 hours Patty- RN at poison control.     Normal chemistries, no QRS widening on EKG.  Given supplemental magnesium  and potassium.  Will continue to monitor for total of 12 hours before medical clearance and psychiatric  evaluation.  Will sign out to oncoming ER physician to follow-up.      Final Clinical Impression(s) / ED Diagnoses Final diagnoses:  Suicidal ideation    Rx / DC Orders ED Discharge Orders     None         Avabella Wailes, Lonni PARAS, MD 02/17/23 0231    Haze Lonni PARAS, MD 02/17/23 [phone]

## 2023-02-17 NOTE — ED Provider Notes (Signed)
 I was asked to follow-up on the patient's evening labs as well as a repeat EKG to determine ultimate disposition for the patient presenting with an intentional ingestion of multiple psychiatric medications yesterday evening.  Physical Exam  BP 134/76   Pulse 96   Temp 97.7 F (36.5 C) (Oral)   Resp 18   SpO2 96%   Physical Exam General: No acute distress  Procedures  Procedures  ED Course / MDM    Medical Decision Making Amount and/or Complexity of Data Reviewed Labs: ordered.  Risk Prescription drug management.   The patient's potassium is back to normal at 4.4.  Her EKG interpreted by me does show persistent QT prolongation.  The patient's QRS is also widened at 135.  I did call and discussed this with poison control.  They recommended further observation for the patient since all of her medications were sustained-release medications.  They recommended repeating EKGs every 6 hours and to treat with sodium bicarb if her QRS is greater than 140.  A call was placed to the hospitalist service for admission.       Ula Prentice SAUNDERS, MD 02/17/23 2138

## 2023-02-17 NOTE — H&P (Signed)
 History and Physical      [name] FMW:[id number] DOB: Jun 10, 1990 DOA: 02/17/2023; DOS: 02/17/2023  PCP: [facility]  Patient coming from: home   I have personally briefly reviewed patient's old medical records in [facility]  Chief Complaint: Intentional overdose  HPI: [name] is a 33 y.o. female with medical history significant for prior intentional overdoses, moderate persistent asthma, bipolar disorder, prediabetes, paroxysmal atrial fibrillation, anemia of chronic disease associated baseline hemoglobin 9-11, who is admitted to [facility] on 02/17/2023 with intentional overdose after presenting from home to [facility] ED complaining of intentional overdose.   Following history is provided by the patient as well as my discussions with the EDP and via chart review.  The patient conveys that she took an undisclosed quantity however on Cymbalta  in an undisclosed quantity of her home propafenone  at approximately 1:30 AM on 02/17/2023 and attempt to harm herself.  Denies taking any other medications as a component of this intentional overdose.  Within 1 hour of these ingestions, she presented to [facility] emergency department for further evaluation and management of this intentional overdose.  She denies any concomitant alcohol  consumption or any recent use of recreational drugs.  She has a documented history of bipolar disorder, with prior intentional drug overdoses that occurred in June 2019, May 2023, and April 2024.  Denies any current visual or auditory  hallucinations.  Currently denies any chest pain, palpitations, diaphoresis, shortness of breath, nausea, vomiting, dizziness, presyncope, or syncope.  For to review, she is on the propafenone  in the setting of history of paroxysmal atrial fibrillation.  Per preliminary chart review, no prior echocardiogram results available.     ED Course:  Vital signs in the ED were notable for the  following: Afebrile; heart rates in the 90s to low 100s; stop blood pressures in the low 100s to 140s; respiratory rate 14-21, and oxygen  saturation 96 to 99% on room air.  Labs were notable for the following: CMP was notable for the following: Sodium 137, potassium 3.7, bicarbonate 23, creatinine 0.85 compared to most recent prior serum creatinine data point of 0.83 on 02/02/2023, glucose 99, liver enzymes were within normal limits.  After interval potassium supplementation, as quantified below, repeat BMP revealed interval increase in potassium level to 4.4.  Initial magnesium  level 1.9, which increased to 2.1 following interval 1 g of IV magnesium  sulfate in the ED.  Urinary drug screen was negative, serum ethanol level less than 10, acetaminophen  level less than 10, salicylate level less than 7.  Lactic acid level 2.0.  CBC notable for white cell count 8900, hemoglobin 10.1 associated with normocytic finding and relative to most recent prior hemoglobin value of 11.7 on 02/02/2023, platelet count 261.  Per my interpretation, EKG in ED demonstrated the following: Will most recent EKG showed sinus rhythm with heart rate 97, nonspecific intraventricular conduction delay with QRS 135, QTc 515, nonspecific T wave inversions in leads III and V2, will will demonstrate no evidence of ST changes, Cleen evidence of ST elevation.  Imaging in the ED, per corresponding formal radiology read, was notable for the following: No imaging performed in the ED today.  Due to the long half-life associated with her Cymbalta  and propafenone , poison control is recommending 36 hour observation (until 1330 on 1/13) with every 6 hour EKG monitoring, specifically for trending QTc as well as QRS.  They recommend initiation of bicarbonate if QRS is greater than 140.  They also recommended goal potassium  and magnesium  levels greater than equal to 4 and greater than or equal to 2, respectively.  While in the ED, the following were  administered:  Cymbalta  x 1 dose, gabapentin  x 2 doses, Ativan  0.5 mg p.o. x 1 dose, metformin , potassium chloride  40 mill equivalents p.o. x 3 doses, prazosin  2 mg p.o. x 1 dose, magnesium  sulfate 1 g IV over 1 hour, normal saline x 1 L bolus.  Subsequently, the patient was admitted for further evaluation and management of intentional overdose with presenting EKG showing nonspecific intraventricular conduction delay, QTc prolongation, with presenting labs notable for mild lactic acidosis.    Review of Systems: As per HPI otherwise 10 point review of systems negative.   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 fragile X syndrome    Chronic constipation    Depression    Drug-seeking behavior    Essential tremor    Headache    Ineffective individual coping 05/16/2022   Insomnia 01/12/2022   Intentional drug overdose (HCC) 06/05/2022   Neuromuscular disorder (HCC)    Normocytic anemia 06/05/2022   Overdose 07/22/2017   Overdose of acetaminophen  07/2017   and other meds   Overdose, intentional self-harm, initial encounter (HCC) 07/20/2021   Paranoia (HCC) 04/22/2021   Personality disorder (HCC)    Prolonged QT interval    Purposeful non-suicidal drug ingestion (HCC) 06/27/2021   Schizo-affective psychosis (HCC)    Schizoaffective disorder (HCC) 07/29/2022   Schizoaffective disorder, bipolar type (HCC)    Seizures (HCC)    Last seizure December 2017   Skin erythema 04/27/2022   Sleep apnea    Suicidal behavior 07/25/2021   Suicidal ideation    Suicide (HCC) 07/01/2021   Suicide attempt (HCC) 07/04/2021    Past Surgical History:  Procedure Laterality Date   MOUTH SURGERY  2009 or  2010    Social History:  reports that she has quit smoking. Her smoking use included cigarettes. She has never used smokeless tobacco. She reports that she does not drink alcohol  and does not use drugs.   Allergies  Allergen Reactions   Bee Venom Anaphylaxis   Coconut Flavoring Agent (Non-Screening) Anaphylaxis and Rash   Fish Allergy Anaphylaxis   Geodon  [Ziprasidone  Hcl] Other (See Comments)    Pt states that this medication causes paralysis of the mouth.     Haloperidol  And Related Other (See Comments)    Pt states that this medication causes paralysis of the mouth, jaw locks up   Lithobid [Lithium ] Other (See Comments)    Seizure-like activity    Roxicodone  [Oxycodone ] Other (See Comments)    Hallucinations    Seroquel  [Quetiapine ] Other (See Comments)    Severe drowsiness    Shellfish Allergy Anaphylaxis   Phenergan  [Promethazine  Hcl] Other (See Comments)    Chest pain     Prilosec [Omeprazole] Nausea And Vomiting and Other (See Comments)    Pt can take protonix  with no problems    Sulfa Antibiotics Other (See Comments)    Chest pain    Tegretol  [Carbamazepine ] Nausea And Vomiting   Prozac  [Fluoxetine ] Other (See Comments)    Increased Depression and Suicidal thoughts   Tape  Other (See Comments)    Skin tears, can only tolerate paper tape.   Tylenol  [Acetaminophen ] Rash and Other (See Comments)    Rash on face     Family History  Problem Relation Age of Onset   Mental illness Father    Asthma Father    PDD Brother    Seizures Brother     Family history reviewed and not pertinent    Prior to Admission medications   Medication Sig Start Date End Date Taking? Authorizing Provider  albuterol  (PROVENTIL ) (2.5 MG/3ML) 0.083% nebulizer solution Take 3 mLs (2.5 mg total) by nebulization every 6 (six) hours as needed for wheezing or shortness of breath. 01/15/23  Yes Tex Drilling, NP  cyclobenzaprine  (FLEXERIL ) 10 MG tablet Take 10 mg by mouth at bedtime. 01/24/23   Yes [provider]  diclofenac  Sodium (VOLTAREN ) 1 % GEL Apply 4 g topically 4 (four) times daily as needed (pain). 01/15/23  Yes Tex Drilling, NP  DULoxetine  (CYMBALTA ) 30 MG capsule Take 3 capsules (90 mg total) by mouth daily. 01/16/23  Yes Tex Drilling, NP  gabapentin  (NEURONTIN ) 600 MG tablet Take 600 mg by mouth 3 (three) times daily. 01/24/23  Yes [provider]  hydrOXYzine  (ATARAX ) 25 MG tablet Take 1 tablet (25 mg total) by mouth 3 (three) times daily as needed for anxiety. 01/15/23  Yes Nkwenti, Doris, NP  melatonin 3 MG TABS tablet Take 1 tablet (3 mg total) by mouth at bedtime. Patient taking differently: Take 3 mg by mouth at bedtime as needed (As needed for sleep). 01/15/23  Yes Tex Drilling, NP  metFORMIN  (GLUCOPHAGE ) 500 MG tablet Take 500 mg by mouth daily. 01/24/23  Yes [provider]  ondansetron  (ZOFRAN -ODT) 4 MG disintegrating tablet Take 1 tablet (4 mg total) by mouth every 8 (eight) hours as needed for nausea or vomiting. 12/17/22  Yes Vicky Charleston, PA-C  pantoprazole  (PROTONIX ) 40 MG tablet Take 1 tablet (40 mg total) by mouth daily before breakfast. 01/15/23 02/17/23 Yes Nkwenti, Doris, NP  prazosin  (MINIPRESS ) 2 MG capsule Take 2 mg by mouth at bedtime. 02/13/23  Yes [provider]  propafenone  (RYTHMOL  SR) 425 MG 12 hr capsule Take 425 mg by mouth 2 (two) times daily. 01/24/23  Yes [provider]  risperiDONE  (RISPERDAL ) 2 MG tablet Take 2 mg by mouth 2 (two) times daily. 02/13/23  Yes [provider]  SYMBICORT 160-4.5 MCG/ACT inhaler Inhale 2 puffs into the lungs in the morning and at bedtime. 01/24/23  Yes [provider]  mometasone -formoterol  (DULERA ) 200-5 MCG/ACT AERO Inhale 2 puffs into the lungs 2 (two) times daily. Patient not taking: Reported on 02/02/2023 01/15/23   Tex Drilling, NP  OLANZapine  (ZYPREXA ) 15 MG tablet Take 1 tablet (15 mg total) by mouth 2 (two) times daily. Patient not  taking: Reported on 02/17/2023 01/15/23   Tex Drilling, NP  propafenone  (RYTHMOL  SR) 225 MG 12 hr capsule Take by mouth. Patient not taking: Reported on 02/17/2023 02/11/23   [provider]     Objective    Physical Exam: Vitals:   02/17/23 0900 02/17/23 1515 02/17/23 2000 02/17/23 2100  BP: 121/66   134/76  Pulse: 98 96  96  Resp: 14 16  18   Temp: 97.9 F (36.6 C)  97.7 F (36.5 C) 97.7 F (36.5 C)  TempSrc: Oral  Oral Oral  SpO2: 97% 99%  96%    General: appears to be stated age; alert, oriented Skin: warm, dry, no rash Head:  AT/[place] Mouth:  Oral mucosa membranes appear dry, normal dentition Neck: supple; trachea midline Heart:  RRR; did not appreciate any M/R/G Lungs: CTAB, did not appreciate any wheezes, rales, or rhonchi Abdomen: + BS; soft, ND, NT Vascular: 2+ pedal pulses b/l; 2+ radial pulses b/l Extremities: no peripheral edema, no muscle wasting Neuro: strength and sensation intact in upper and lower extremities b/l    Labs on Admission: I have personally reviewed following labs and imaging studies  CBC: Recent Labs  Lab 02/17/23 0955  WBC 8.9  HGB 10.1*  HCT 33.8*  MCV 83.7  PLT 261   Basic Metabolic Panel: Recent Labs  Lab 02/17/23 0440 02/17/23 1203 02/17/23 2000  NA 137 137 136  K 3.7 3.6 4.4  CL 105 114* 109  CO2 23 19* 21*  GLUCOSE 113* 87 99  BUN 6 6 7   CREATININE 0.85 0.60 0.58  CALCIUM  8.9 7.4* 9.0  MG 1.9 2.1  --    GFR: CrCl cannot be calculated (Unknown ideal weight.). Liver Function Tests: Recent Labs  Lab 02/17/23 0440  AST 15  ALT 19  ALKPHOS 89  BILITOT 0.5  PROT 7.2  ALBUMIN 3.9   No results for input(s): LIPASE, AMYLASE in the last 168 hours. No results for input(s): AMMONIA in the last 168 hours. Coagulation Profile: No results for input(s): INR, PROTIME in the last 168 hours. Cardiac Enzymes: No results for input(s): CKTOTAL, CKMB, CKMBINDEX, TROPONINI in the last 168 hours. BNP  (last 3 results) No results for input(s): PROBNP in the last 8760 hours. HbA1C: No results for input(s): HGBA1C in the last 72 hours. CBG: No results for input(s): GLUCAP in the last 168 hours. Lipid Profile: No results for input(s): CHOL, HDL, LDLCALC, TRIG, CHOLHDL, LDLDIRECT in the last 72 hours. Thyroid  Function Tests: No results for input(s): TSH, T4TOTAL, FREET4, T3FREE, THYROIDAB in the last 72 hours. Anemia Panel: No results for input(s): VITAMINB12, FOLATE, FERRITIN, TIBC, IRON, RETICCTPCT in the last 72 hours. Urine analysis:    Component Value Date/Time   COLORURINE YELLOW 12/17/2022 0031   APPEARANCEUR HAZY (A) 12/17/2022 0031   LABSPEC 1.016 12/17/2022 0031   PHURINE 5.0 12/17/2022 0031   GLUCOSEU NEGATIVE 12/17/2022 0031   HGBUR NEGATIVE 12/17/2022 0031   BILIRUBINUR NEGATIVE 12/17/2022 0031   BILIRUBINUR negative 06/14/2015 0930   KETONESUR NEGATIVE 12/17/2022 0031   PROTEINUR NEGATIVE 12/17/2022 0031   UROBILINOGEN 0.2 10/24/2015 1428   NITRITE NEGATIVE 12/17/2022 0031   LEUKOCYTESUR TRACE (A) 12/17/2022 0031    Radiological Exams on Admission: No results found.    Assessment/Plan    Principal Problem:   Intentional overdose (HCC) Active Problems:   Borderline personality disorder (HCC)   Lactic acidosis   Suicide attempt by drug ingestion (HCC)   Bipolar disorder (HCC)   Adjustment disorder with mixed anxiety and depressed mood   QT prolongation   Intentional overdose of drug in tablet form (HCC)   Intraventricular conduction delay   Moderate persistent asthma   Prediabetes   GERD (gastroesophageal reflux disease)   Paroxysmal atrial fibrillation (HCC)   Anemia of chronic disease     #) Intentional drug overdose: Patient presents with intentional overdose of her home Cymbalta  and propafenone , taking undisclosed/unclear quantity of these home medications with the intent to harm herself at 1:30 AM on  02/17/2023.  This occurs in the context of a history of bipolar disorder as well as a history of multiple prior intentional drug overdoses, as further detailed above.  Due  to the long half-life associated with her Cymbalta  and propafenone , poison control is recommending 36 hour observation (until 1330 on 1/13) with every 6 hour EKG monitoring, specifically for trending QTc as well as QRS.  They recommend initiation of bicarbonate if QRS is greater than 140, while noting that most recent EKG shows QRS of 135 ms.  They also recommended goal potassium and magnesium  levels greater than equal to 4 and greater than or equal to 2, respectively, with the respective levels currently at goal following independent supplementation as ordered by EDP, as further detailed above.  In addition to intraventricular conduction delay, her overdose on propafenone  can be associated with increased risk for bradycardia as well as hypotension.  Heart rates have been in the 90s to low 100s, and she has remained normotensive throughout her ED course.  She is awake and alert, protecting her airway, and appears to be in no acute respiratory distress.  He is without any specific acute symptoms at this time.  She was IVC'ed by EDP who is also placed order for psychiatry consultation.  Plan: Monitor on telemetry.  Hold home Cymbalta  and propafenone .  Every 6 hour EKGs have been ordered through noon on 02/18/2023 per recommendation of poison control, with plan for prn bicarbonate if ensuing QRS is found to be greater than 140 ms. repeat CMP in serum magnesium  levels in the morning, with prn supplementation to maintain potassium and magnesium  levels greater than or equal to 4 and 2, respectively.  Suicide precautions, with one-to-one monitoring.  Will start continuous normal saline for hemodynamic support in the setting of evidence of mild lactic acidosis.  Psychiatry consult, as above.                #) Nonspecific  intraventricular conduction delay/QTc prolongation: Present on EKG in the ED, as quantified above, with risk factors include potential side effects from her intentional propafenone  overdose, contributing to the nonspecific intraventricular conduction delay, as well as her intentional overdose on Cymbalta .  As described above, Poison control recommended to 6-hour EKG monitoring with prn bicarbonate for QRS greater than 140 ms.   Plan: Monitor on telemetry.  Every 6 hours EKG monitoring per poison control.  Prn IV bicarbonate for QRS greater than 140 ms, as above.  Hold home propafenone  as well as home Cymbalta .  Further evaluation management of presenting intentional overdose, as above. Will also hold home Symbicort and as needed albuterol  for now as the beta-2 agonist can further exacerbate QTc prolongation.                 #) Lactic acidosis: Presenting lactate mildly elevated 2.0.  In the absence of leukocytosis in the absence of objective fever, surgical history for sepsis not currently met.  No evidence of underlying infectious process at this time.  Potential contributions from mild dehydration.  She is also noted to be on metformin  as an outpatient.  Given her overdose on propafenone , will also repeat CMP in the morning in the interval change in her transaminases, also checking INR to evaluate hepatic synthetic function.  Plan: Normal saline, as above, with repeat lactic acid level ordered for the morning.  CMP, CBC in the morning.  Check INR.  Add on CPK level.  Hold home metformin .                  #) Moderate persistent asthma: documented history thereof, without clinical e/o to suggest current exacerbation. Outpatient respiratory regimen includes Symbicort as well as prn  albuterol .  Will hold these home beta-2 agonist for now in the setting of her QTc prolongation given risk for further exacerbation As a consequence of these home respiratory medications.  Plan:  Check serum magnesium  level.  Holding home Symbicort and as needed albuterol  inhaler for now, as above.  Serial monitoring of QTc/QRS via every 6 hour EKGs, as above.                 #) Bipolar disorder: Documented history of such, with outpatient medications that include Cymbalta , gabapentin , prazosin , risperidone .  In the setting of her presenting Cymbalta  overdose, will hold home Cymbalta  for now.  Gabapentin  and prazosin  were resumed in the ED.  Plan: Psychiatry consultation, as above.  Hold home Cymbalta  for now.  Continue outpatient gabapentin , prazosin , risperidone , pending further psychiatry recommendations.SABRA               #) Prediabetes: Documented history of such, with most recent hemoglobin A1c noted to be 5.5% when checked in September 2024.  On metformin  as an outpatient.  Presenting blood sugar noted to be 99.  Will hold home metformin  for now in the setting of lactic acidosis, as above.  Plan: Hold home metformin  for now.  CMP in the morning.                 #) GERD: documented h/o such; on Protonix  as outpatient.   Plan: continue home PPI.                   #) Paroxysmal atrial fibrillation: Documented history of such. In setting of CHA2DS2-VASc score of  1, there is not an independent indication for chronic anticoagulation for thromboembolic.  Not on dedicated AV nodal blocking agents at home.  Rather, she is on rhythm control via home propafenone , which will be held for now in the setting of associated intentional overdose thereof leading to his current hospitalization.  Per initial chart review, no prior echocardiogram on file.  Would likely benefit from outpatient echocardiogram.  Sinus rhythm without overt evidence of acute skin changes  Plan: monitor strict I's & O's and daily weights. CMP/CBC in AM.  Recheck serum mag level.  Hold home propafenone  for now in the setting of intentional overdose.  Monitor on telemetry.   Recommend outpatient echocardiogram, as above.                    #) Anemia of chronic disease: Documented history of such, a/w with baseline hgb range 9-12, with presenting hgb consistent with this range, in the absence of any overt evidence of active bleed.     Plan: Repeat CBC in the morning.  Check INR.      DVT prophylaxis: SCD's   Code Status: Full code Family Communication: none Disposition Plan: Per Rounding Team Consults called: Psychiatry consultation, as ordered by EDP, as above;  Admission status: Observation    I SPENT GREATER THAN 75  MINUTES IN CLINICAL CARE TIME/MEDICAL DECISION-MAKING IN COMPLETING THIS ADMISSION.     Eva NOVAK Gleason Ardoin DO [facility] From 7PM - 7AM   02/17/2023, 10:26 PM

## 2023-02-17 NOTE — ED Notes (Signed)
 Spoke to [place] @ poison control and gave recent v/s, EKG results.  They requested potassium level to be close to 4 and magnesium close to 2.  Repeat EKG q 4
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
## 2023-02-17 NOTE — ED Triage Notes (Addendum)
 Pt states she took flexeril , Protonix , metformin , respiradal, cymbalta , and neurontin , prazosin , rythmol . States she took 4-5 days worth. SI. Pt appears to be at baseline. VSS. Pt states she took 45 mins ago.   POISON CONTROL 12 hour minimum on cardiac monitor. Repeat EKG Q 4 hrs. QTC- optimize potassium to 4.2 and magnesium  to 2.2 Any QRS >120 needs bicarb boluses to improve  Metformin  can cause lactic acidosis but unlikely with dose. Check 1 lactic and BMP now and another in 4 hours Patty- RN at poison control.

## 2023-02-17 NOTE — ED Notes (Signed)
 Pt requesting anxiety medication, Dr. Rhae Hammock notified

## 2023-02-18 ENCOUNTER — Encounter (HOSPITAL_COMMUNITY): Payer: Self-pay | Admitting: Internal Medicine

## 2023-02-18 DIAGNOSIS — T50902A Poisoning by unspecified drugs, medicaments and biological substances, intentional self-harm, initial encounter: Secondary | ICD-10-CM | POA: Diagnosis not present

## 2023-02-18 DIAGNOSIS — I459 Conduction disorder, unspecified: Secondary | ICD-10-CM | POA: Diagnosis present

## 2023-02-18 DIAGNOSIS — I480 Paroxysmal atrial fibrillation: Secondary | ICD-10-CM | POA: Diagnosis present

## 2023-02-18 DIAGNOSIS — D638 Anemia in other chronic diseases classified elsewhere: Secondary | ICD-10-CM | POA: Diagnosis present

## 2023-02-18 DIAGNOSIS — R7303 Prediabetes: Secondary | ICD-10-CM | POA: Diagnosis present

## 2023-02-18 DIAGNOSIS — K219 Gastro-esophageal reflux disease without esophagitis: Secondary | ICD-10-CM | POA: Diagnosis present

## 2023-02-18 DIAGNOSIS — J4540 Moderate persistent asthma, uncomplicated: Secondary | ICD-10-CM | POA: Diagnosis present

## 2023-02-18 LAB — CBC WITH DIFFERENTIAL/PLATELET
Abs Immature Granulocytes: 0.05 10*3/uL (ref 0.00–0.07)
Basophils Absolute: 0 10*3/uL (ref 0.0–0.1)
Basophils Relative: 1 %
Eosinophils Absolute: 0.1 10*3/uL (ref 0.0–0.5)
Eosinophils Relative: 1 %
HCT: 37.3 % (ref 36.0–46.0)
Hemoglobin: 11.6 g/dL — ABNORMAL LOW (ref 12.0–15.0)
Immature Granulocytes: 1 %
Lymphocytes Relative: 21 %
Lymphs Abs: 1.9 10*3/uL (ref 0.7–4.0)
MCH: 25.6 pg — ABNORMAL LOW (ref 26.0–34.0)
MCHC: 31.1 g/dL (ref 30.0–36.0)
MCV: 82.2 fL (ref 80.0–100.0)
Monocytes Absolute: 0.6 10*3/uL (ref 0.1–1.0)
Monocytes Relative: 7 %
Neutro Abs: 6 10*3/uL (ref 1.7–7.7)
Neutrophils Relative %: 69 %
Platelets: 303 10*3/uL (ref 150–400)
RBC: 4.54 MIL/uL (ref 3.87–5.11)
RDW: 14.5 % (ref 11.5–15.5)
WBC: 8.6 10*3/uL (ref 4.0–10.5)
nRBC: 0 % (ref 0.0–0.2)

## 2023-02-18 LAB — MAGNESIUM: Magnesium: 2 mg/dL (ref 1.7–2.4)

## 2023-02-18 LAB — COMPREHENSIVE METABOLIC PANEL
ALT: 21 U/L (ref 0–44)
AST: 15 U/L (ref 15–41)
Albumin: 3.7 g/dL (ref 3.5–5.0)
Alkaline Phosphatase: 80 U/L (ref 38–126)
Anion gap: 8 (ref 5–15)
BUN: 8 mg/dL (ref 6–20)
CO2: 23 mmol/L (ref 22–32)
Calcium: 9.3 mg/dL (ref 8.9–10.3)
Chloride: 106 mmol/L (ref 98–111)
Creatinine, Ser: 0.77 mg/dL (ref 0.44–1.00)
GFR, Estimated: >60 mL/min (ref >60)
Glucose, Bld: 103 mg/dL — ABNORMAL HIGH (ref 70–99)
Potassium: 4.3 mmol/L (ref 3.5–5.1)
Sodium: 137 mmol/L (ref 135–145)
Total Bilirubin: 0.4 mg/dL (ref 0.0–1.2)
Total Protein: 7.4 g/dL (ref 6.5–8.1)

## 2023-02-18 LAB — PROTIME-INR
INR: 1.1 (ref 0.8–1.2)
Prothrombin Time: 14.7 s (ref 11.4–15.2)

## 2023-02-18 LAB — CK: Total CK: 29 U/L — ABNORMAL LOW (ref 38–234)

## 2023-02-18 LAB — SARS CORONAVIRUS 2 BY RT PCR: SARS Coronavirus 2 by RT PCR: POSITIVE — AB

## 2023-02-18 LAB — LACTIC ACID, PLASMA: Lactic Acid, Venous: 1.1 mmol/L (ref 0.5–1.9)

## 2023-02-18 MED ORDER — ENOXAPARIN SODIUM 60 MG/0.6ML IJ SOSY
55.0000 mg | PREFILLED_SYRINGE | INTRAMUSCULAR | Status: DC
Start: 2023-02-18 — End: 2023-02-21

## 2023-02-18 MED ORDER — RISPERIDONE 1 MG PO TABS
2.0000 mg | ORAL_TABLET | Freq: Two times a day (BID) | ORAL | Status: DC
  Administered 2023-02-18 – 2023-02-21 (×7): 2 mg via ORAL
  Filled 2023-02-18 (×3): qty 2
  Filled 2023-02-18: qty 4
  Filled 2023-02-18 (×3): qty 2

## 2023-02-18 MED ORDER — SODIUM CHLORIDE 0.9 % IV SOLN: INTRAVENOUS | Status: AC

## 2023-02-18 MED ORDER — IBUPROFEN 200 MG PO TABS
600.0000 mg | ORAL_TABLET | Freq: Four times a day (QID) | ORAL | Status: DC | PRN
  Administered 2023-02-18 – 2023-02-19 (×2): 600 mg via ORAL
  Filled 2023-02-18 (×3): qty 3

## 2023-02-18 NOTE — ED Notes (Signed)
 Alert, NAD, calm, ,interactive, resps e/u, speaking clearly, denies pain, sob, nausea, dizziness, or other sx. "Felt fine walking to b/r". Asking about breakfast. Updated. VSS. Sitter present.

## 2023-02-18 NOTE — ED Notes (Signed)
 Alert, calm, NAD, sitter at [facility].

## 2023-02-18 NOTE — Care Management Obs Status (Signed)
 MEDICARE OBSERVATION STATUS NOTIFICATION   Patient Details  Name: [name] MRN: [id number] Date of Birth: 02/19/1990   Medicare Observation Status Notification Given:  Yes    Otelia Santee, LCSW 02/18/2023, 3:26 PM

## 2023-02-18 NOTE — ED Notes (Signed)
 Pt sleeping, will obtain temperature soon

## 2023-02-18 NOTE — ED Notes (Addendum)
 Remains agitated, yelling out in anger d/t wait, sounds, possibly internal stimuli, will monitor.

## 2023-02-18 NOTE — ED Notes (Signed)
 Crying, agreed to ativan

## 2023-02-18 NOTE — ED Notes (Signed)
 Updated on wait, pt irritable.

## 2023-02-18 NOTE — ED Notes (Signed)
 Tearful, crying, updated.

## 2023-02-18 NOTE — ED Notes (Signed)
 Pt speaking with father on phone. Sitter at [employer]. Psych NP in to see, at [facility]. Updates pt that her [facility] therapist has been notified.

## 2023-02-18 NOTE — ED Notes (Signed)
 Plan, process, boundaries, behavior, medications, wait explained with rationale. Refused ativan. Offered again.

## 2023-02-18 NOTE — TOC Initial Note (Addendum)
 Transition of Care [facility]) - Initial/Assessment Note    Patient Details  Name: [name] MRN: [id number] Date of Birth: [birth year]  Transition of Care [facility]) CM/SW Contact:    Hoy DELENA Bigness, LCSW Phone Number: 02/18/2023, 2:01 PM  Clinical Narrative:                 Pt here for intentional over-dose on medications. Pt under IVC and recommended for inpatient psychiatric placement. Pt is medically stable to transfer to psychiatric unit. No bed available at [facility] and [facility]. Referrals have been faxed out for psych placement and currently awaiting bed offer/availability.    UPDATE: Pt accepted to [facility] pending negative COVID test. IVC and COVID results to be faxed to [phone].    UPDATE: Pt COVID test positive. [facility] unable to accept until 7 days post COVID positive test. TOC will continue to follow for placement.    Expected Discharge Plan: Psychiatric Hospital Barriers to Discharge: Psych Bed not available   Patient Goals and CMS Choice Patient states their goals for this hospitalization and ongoing recovery are:: For pt to go to psychiatric hospital CMS Medicare.gov Compare Post Acute Care list provided to::  (NA) Choice offered to / list presented to :  (NA) [place] ownership interest in [facility].provided to::  (NA)    Expected Discharge Plan and Services In-house Referral: Clinical Social Work Discharge Planning Services: NA Post Acute Care Choice: NA Living arrangements for the past 2 months: Apartment                 DME Arranged: N/A DME Agency: NA                  Prior Living Arrangements/Services Living arrangements for the past 2 months: Apartment Lives with:: Self Patient language and need for interpreter reviewed:: Yes Do you feel safe going back to the place where you live?: Yes      Need for Family Participation in Patient Care: No (Comment) Care giver support system in place?: No (comment)    Criminal Activity/Legal Involvement Pertinent to Current Situation/Hospitalization: No - Comment as needed  Activities of Daily Living   ADL Screening (condition at time of admission) Independently performs ADLs?: Yes (appropriate for developmental age) Is the patient deaf or have difficulty hearing?: No Does the patient have difficulty seeing, even when wearing glasses/contacts?: No Does the patient have difficulty concentrating, remembering, or making decisions?: No  Permission Sought/Granted   Permission granted to share information with : No              Emotional Assessment   Attitude/Demeanor/Rapport: Unable to Assess Affect (typically observed): Unable to Assess Orientation: : Oriented to Self, Oriented to Place, Oriented to  Time, Oriented to Situation Alcohol  / Substance Use: Not Applicable Psych Involvement: Yes (comment), Outpatient Provider  Admission diagnosis:  Suicidal ideation [R45.851] Prolonged Q-T interval on ECG [R94.31] Intentional overdose of drug in tablet form (HCC) [T50.902A] Patient Active Problem List   Diagnosis Date Noted   Intraventricular conduction delay 02/18/2023   Moderate persistent asthma 02/18/2023   Prediabetes 02/18/2023   GERD (gastroesophageal reflux disease) 02/18/2023   Paroxysmal atrial fibrillation (HCC) 02/18/2023   Anemia of chronic disease 02/18/2023   Intentional overdose of drug in tablet form (HCC) 02/17/2023   Prolonged Q-T interval on ECG 12/23/2022   Overdose 08/22/2022   Drug overdose 08/22/2022   Intentional overdose (HCC) 06/05/2022   Passive suicidal ideations 04/14/2022   Malingering 02/22/2022  Adjustment disorder with mixed anxiety and depressed mood 01/31/2022   Pain in joint, ankle and foot 01/12/2022   Suicide attempt by cutting of wrist (HCC) 10/11/2021   Fibromyalgia 10/07/2021   Self-injurious behavior 07/19/2021   Bipolar disorder (HCC) 07/05/2021   Suicide attempt by drug ingestion (HCC)  07/04/2021   GAD (generalized anxiety disorder) 04/22/2021   Intentional acetaminophen  overdose (HCC)    Lactic acidosis    DUB (dysfunctional uterine bleeding) 11/22/2016   Hyperprolactinemia (HCC) 08/20/2016   Seizure disorder (HCC) 08/08/2015   Migraines 07/27/2015   Asthma 04/15/2015   Schizoaffective disorder, bipolar type (HCC) 03/10/2014   PTSD (post-traumatic stress disorder) 03/10/2014   Borderline personality disorder (HCC) 10/31/2013   Asperger syndrome 06/15/2013   PCP:  [facility]:   [facility] - [place], [place] - [address] [place] [address] Phone: [phone] Fax: [phone]     Social Drivers of Health (SDOH) Social History: SDOH Screenings   Food Insecurity: No Food Insecurity (02/18/2023)  Housing: Patient Declined (02/18/2023)  Transportation Needs: Patient Unable To Answer (02/18/2023)  Utilities: Not At Risk (02/18/2023)  Alcohol  Screen: Low Risk  (12/19/2022)  Depression (PHQ2-9): Medium Risk (03/10/2022)  Tobacco Use: Medium Risk (02/18/2023)   SDOH Interventions: Food Insecurity Interventions: Inpatient TOC   Readmission Risk Interventions    06/06/2022    3:38 PM  Readmission Risk Prevention Plan  Transportation Screening Complete  Medication Review (RN Care Manager) Complete  PCP or Specialist appointment within 3-5 days of discharge Complete  HRI or Home Care Consult Complete  SW Recovery Care/Counseling Consult Complete  Palliative Care Screening Not Applicable  Skilled Nursing Facility Not Applicable

## 2023-02-18 NOTE — Progress Notes (Addendum)
 PROGRESS NOTE    [name]  FMW:[id number] DOB: 1990/05/22 DOA: 02/17/2023 PCP: [facility]  Outpatient Specialists: behavioral health    Brief Narrative:   From admission h and p  [name] is a 33 y.o. female with medical history significant for prior intentional overdoses, moderate persistent asthma, bipolar disorder, prediabetes, paroxysmal atrial fibrillation, anemia of chronic disease associated baseline hemoglobin 9-11, who is admitted to [facility] on 02/17/2023 with intentional overdose after presenting from home to [facility] ED complaining of intentional overdose.    Following history is provided by the patient as well as my discussions with the EDP and via chart review.   The patient conveys that she took an undisclosed quantity however on Cymbalta  in an undisclosed quantity of her home propafenone  at approximately 1:30 AM on 02/17/2023 and attempt to harm herself.  Denies taking any other medications as a component of this intentional overdose.  Within 1 hour of these ingestions, she presented to [facility] emergency department for further evaluation and management of this intentional overdose.  She denies any concomitant alcohol  consumption or any recent use of recreational drugs.  She has a documented history of bipolar disorder, with prior intentional drug overdoses that occurred in June 2019, May 2023, and April 2024.  Denies any current visual or auditory  hallucinations.   Currently denies any chest pain, palpitations, diaphoresis, shortness of breath, nausea, vomiting, dizziness, presyncope, or syncope.   For to review, she is on the propafenone  in the setting of history of paroxysmal atrial fibrillation.  Per preliminary chart review, no prior echocardiogram results available.       Assessment & Plan:   Principal Problem:   Intentional overdose (HCC) Active Problems:   Borderline personality disorder (HCC)   Lactic  acidosis   Suicide attempt by drug ingestion (HCC)   Bipolar disorder (HCC)   Adjustment disorder with mixed anxiety and depressed mood   Prolonged Q-T interval on ECG   Intentional overdose of drug in tablet form (HCC)   Intraventricular conduction delay   Moderate persistent asthma   Prediabetes   GERD (gastroesophageal reflux disease)   Paroxysmal atrial fibrillation (HCC)   Anemia of chronic disease   # Intentional drug overdose Of cymbalta  and propafenone , 1:30 AM on 1/12, per prior provider's discussion w/ poison control needs q6 EKG until 1:30 PM on 1/13 to monitor qtc and qrs, bicarbonate if qrs greater than 140, keep k above 4 and mg above 2. EKG this afternoon with qrs less that 140 - behavioral health team notified patient now medically stable for behavioral health admissioin  # Covid positive Appears to be asymptomatic. Checked this test per request of psych facility ([facility]). They now can't take her - monitor symptoms - will need to query patient further about onset of any symptoms, whether any recent outpatient tests, etc. It's possible this is a persistent positive after a resolved infection.  # Bipolar disorder IVC in place, plan is behavioral health admission once medically cleared  # Arrhythmia Appears was started on propafenone  in October but unclear why. HPI mentions a-fib but this is the first mention of a-fib for this patient. Someone may have started it for svt? - will need to query patient further regarding need for this med - hold propafenone  for now   DVT prophylaxis: lovenox  Code Status: full Family Communication: none at bedside  Level of care: Telemetry Status is: Observation    Consultants:  Behavioral health  Procedures: none  Antimicrobials:  none    Subjective: Reports feeling fine, no abd pain, no nausea/vomiting, no chest pain or prlpitations  Objective: Vitals:   02/18/23 0740 02/18/23 0745 02/18/23 0915 02/18/23 1000   BP:   (!) 117/55 111/83  Pulse: (!) 105 100 99 (!) 110  Resp: 17 20 17  (!) 21  Temp:      TempSrc:      SpO2: 97% 96% 96% 100%  Weight:      Height:        Intake/Output Summary (Last 24 hours) at 02/18/2023 1100 Last data filed at 02/18/2023 0846 Gross per 24 hour  Intake 118 ml  Output --  Net 118 ml   Filed Weights   02/18/23 0002  Weight: 113 kg    Examination:  General exam: Appears calm and comfortable  Respiratory system: Clear to auscultation. Respiratory effort normal. Cardiovascular system: S1 & S2 heard, RRR.   Gastrointestinal system: Abdomen is obese, soft and nontender.   Central nervous system: Alert and oriented. No focal neurological deficits. Extremities: Symmetric 5 x 5 power. Skin: No rashes, lesions or ulcers Psychiatry: appears upset    Data Reviewed: I have personally reviewed following labs and imaging studies  CBC: Recent Labs  Lab 02/17/23 0955 02/18/23 0638  WBC 8.9 8.6  NEUTROABS  --  6.0  HGB 10.1* 11.6*  HCT 33.8* 37.3  MCV 83.7 82.2  PLT 261 303   Basic Metabolic Panel: Recent Labs  Lab 02/17/23 0440 02/17/23 1203 02/17/23 2000 02/18/23 0638  NA 137 137 136 137  K 3.7 3.6 4.4 4.3  CL 105 114* 109 106  CO2 23 19* 21* 23  GLUCOSE 113* 87 99 103*  BUN 6 6 7 8   CREATININE 0.85 0.60 0.58 0.77  CALCIUM  8.9 7.4* 9.0 9.3  MG 1.9 2.1  --  2.0   GFR: Estimated Creatinine Clearance: 122 mL/min (by C-G formula based on SCr of 0.77 mg/dL). Liver Function Tests: Recent Labs  Lab 02/17/23 0440 02/18/23 0638  AST 15 15  ALT 19 21  ALKPHOS 89 80  BILITOT 0.5 0.4  PROT 7.2 7.4  ALBUMIN 3.9 3.7   No results for input(s): LIPASE, AMYLASE in the last 168 hours. No results for input(s): AMMONIA in the last 168 hours. Coagulation Profile: Recent Labs  Lab 02/18/23 0638  INR 1.1   Cardiac Enzymes: Recent Labs  Lab 02/18/23 0638  CKTOTAL 29*   BNP (last 3 results) No results for input(s): PROBNP in the last  8760 hours. HbA1C: No results for input(s): HGBA1C in the last 72 hours. CBG: No results for input(s): GLUCAP in the last 168 hours. Lipid Profile: No results for input(s): CHOL, HDL, LDLCALC, TRIG, CHOLHDL, LDLDIRECT in the last 72 hours. Thyroid  Function Tests: No results for input(s): TSH, T4TOTAL, FREET4, T3FREE, THYROIDAB in the last 72 hours. Anemia Panel: No results for input(s): VITAMINB12, FOLATE, FERRITIN, TIBC, IRON, RETICCTPCT in the last 72 hours. Urine analysis:    Component Value Date/Time   COLORURINE YELLOW 12/17/2022 0031   APPEARANCEUR HAZY (A) 12/17/2022 0031   LABSPEC 1.016 12/17/2022 0031   PHURINE 5.0 12/17/2022 0031   GLUCOSEU NEGATIVE 12/17/2022 0031   HGBUR NEGATIVE 12/17/2022 0031   BILIRUBINUR NEGATIVE 12/17/2022 0031   BILIRUBINUR negative 06/14/2015 0930   KETONESUR NEGATIVE 12/17/2022 0031   PROTEINUR NEGATIVE 12/17/2022 0031   UROBILINOGEN 0.2 10/24/2015 1428   NITRITE NEGATIVE 12/17/2022 0031   LEUKOCYTESUR TRACE (A) 12/17/2022 0031   Sepsis Labs: @LABRCNTIP (procalcitonin:4,lacticidven:4)  )  No results found for this or any previous visit (from the past 240 hours).       Radiology Studies: No results found.      Scheduled Meds:  gabapentin   600 mg Oral TID   pantoprazole   40 mg Oral QAC breakfast   prazosin   2 mg Oral QHS   risperiDONE   2 mg Oral BID   Continuous Infusions:  sodium chloride  100 mL/hr at 02/18/23 0235     LOS: 0 days     Devaughn KATHEE Ban, MD [facility]   If 7PM-7AM, please contact night-coverage [email or web address] Password [email or web address] 02/18/2023, 11:00 AM

## 2023-02-18 NOTE — ED Notes (Signed)
 [facility] called [facility], Pts ACT team. To inform them that pt is being admitted medically and a psych consult will be done as well. [facility] spoke with Shenese from pts team who informed [facility] that pt is currently involved in weekly therapy and has been receiving a weeks worth of medication at a a time. Because of pt overdosing on her medications and possibly stockpiling them to OD, [facility] asked if there plans to change how pts meds are being provided. Shenese said that the team will possibly provide a pill minder with just a few days of medication at a time to try and prevent pt from overdosing.   Chesley Holt, [facility]  02/18/23

## 2023-02-19 DIAGNOSIS — F250 Schizoaffective disorder, bipolar type: Secondary | ICD-10-CM | POA: Diagnosis not present

## 2023-02-19 DIAGNOSIS — T50902A Poisoning by unspecified drugs, medicaments and biological substances, intentional self-harm, initial encounter: Secondary | ICD-10-CM | POA: Diagnosis not present

## 2023-02-19 MED ORDER — BENZONATATE 100 MG PO CAPS
100.0000 mg | ORAL_CAPSULE | Freq: Three times a day (TID) | ORAL | Status: DC | PRN
  Administered 2023-02-19 – 2023-02-21 (×6): 100 mg via ORAL
  Filled 2023-02-19 (×6): qty 1

## 2023-02-19 MED ORDER — OLANZAPINE 5 MG PO TABS
5.0000 mg | ORAL_TABLET | Freq: Three times a day (TID) | ORAL | Status: DC | PRN
  Administered 2023-02-19 – 2023-02-20 (×2): 5 mg via ORAL
  Filled 2023-02-19 (×5): qty 1

## 2023-02-19 MED ORDER — DULOXETINE HCL 30 MG PO CPEP
30.0000 mg | ORAL_CAPSULE | Freq: Every day | ORAL | Status: DC
  Administered 2023-02-19: 30 mg via ORAL
  Filled 2023-02-19: qty 1

## 2023-02-19 MED ORDER — OLANZAPINE 10 MG IM SOLR: 5.0000 mg | Freq: Two times a day (BID) | INTRAMUSCULAR | Status: DC | PRN

## 2023-02-19 MED ORDER — LORAZEPAM 0.5 MG PO TABS
0.5000 mg | ORAL_TABLET | Freq: Four times a day (QID) | ORAL | Status: DC | PRN
  Administered 2023-02-19 – 2023-02-21 (×5): 0.5 mg via ORAL
  Filled 2023-02-19 (×5): qty 1

## 2023-02-19 NOTE — TOC Progression Note (Signed)
 Transition of Care [facility]) - Progression Note    Patient Details  Name: [name] MRN: [id number] Date of Birth: 1990/06/03  [facility]) CM/SW Contact  Hoy DELENA Bigness, LCSW Phone Number: 02/19/2023, 2:23 PM  Clinical Narrative:    Resent referral to [facility] for review for placement. Per intake staff they are able to accept COVID+ pt's on a case by case basis.    Expected Discharge Plan: Psychiatric Hospital Barriers to Discharge: Barriers Resolved  Expected Discharge Plan and Services In-house Referral: Clinical Social Work Discharge Planning Services: NA Post Acute Care Choice: NA Living arrangements for the past 2 months: Apartment                 DME Arranged: N/A DME Agency: NA                   Social Determinants of Health (SDOH) Interventions SDOH Screenings   Food Insecurity: No Food Insecurity (02/18/2023)  Housing: Patient Declined (02/18/2023)  Transportation Needs: Patient Unable To Answer (02/18/2023)  Utilities: Not At Risk (02/18/2023)  Alcohol  Screen: Low Risk  (12/19/2022)  Depression (PHQ2-9): Medium Risk (03/10/2022)  Tobacco Use: Medium Risk (02/18/2023)    Readmission Risk Interventions    06/06/2022    3:38 PM  Readmission Risk Prevention Plan  Transportation Screening Complete  Medication Review (RN Care Manager) Complete  PCP or Specialist appointment within 3-5 days of discharge Complete  HRI or Home Care Consult Complete  SW Recovery Care/Counseling Consult Complete  Palliative Care Screening Not Applicable  Skilled Nursing Facility Not Applicable

## 2023-02-19 NOTE — Progress Notes (Signed)
 Chaplain met with [name] at her request.  Chaplain provided emotional support and listening as she shared about what was happening for her.  She is scared that she has 6 months to live and spoke about some other things that are weighing on her heart right now.  Chaplain affirmed that she is safe right now and that she doesn't need to do anything with the thoughts she is having.  She wants to be able to talk to her daughter to apologize for something.  Chaplain affirmed that if she still felt that way when she feels better, she can think about doing it then. Chaplain provided supportive presence and emotional support.  [address] Bcc Pager, [phone]

## 2023-02-19 NOTE — Hospital Course (Signed)
 32yo with h/o depression with prior suicide attempts via overdose, moderate persistent asthma, bipolar d/o, preDM, and afib who presented on 1/12 with an intentional overdose of an unknown quantity of duloxetine  and propafenone .  She is awaiting inpatient psych treatment but had an incidental positive COVID test (asymptomatic), which may hinder placement.

## 2023-02-19 NOTE — Progress Notes (Signed)
 Pt is medically admitted. Pt is being follow by [facility] inpatient team. This CSW will now remove pt from the TTS/BH shift report. TOC to continue to assist.  Maryjean Ka, MSW, LCSWA 02/19/2023 1:50 AM

## 2023-02-19 NOTE — Progress Notes (Signed)
 Pt removed Periferal iv, Stated "I don't want to be poisoned"  Patient paranoid, initially refused Hs meds, subsequently took them after encouragement. Positive Suicidal ideations without plan. Sitter at bedside
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
## 2023-02-19 NOTE — Progress Notes (Signed)
 Progress Note   Patient: [name] FMW:[id number] DOB: 20-Jun-1990 DOA: 02/17/2023     0 DOS: the patient was seen and examined on 02/19/2023   Brief hospital course: 32yo with h/o depression with prior suicide attempts via overdose, moderate persistent asthma, bipolar d/o, preDM, and afib who presented on 1/12 with an intentional overdose of an unknown quantity of duloxetine  and propafenone .  She is awaiting inpatient psych treatment but had an incidental positive COVID test (asymptomatic), which may hinder placement.  Assessment and Plan:  Intentional drug overdose Patient reports recent 6 day admission at [facility], felt she needed to stay longer She has only been home a few days, developed recurrent SI/HI Starting taking inappropriate amounts of all home meds including duloxetine , propafenone  Poison control recommended q6 EKG until 1:30 PM on 1/13 to monitor qtc and qrs, bicarbonate if qrs greater than 140, keep k above 4 and mg above 2.  Monitoring is complete and she is medically stable for behavioral health admission She remains with active SI (would use scissors, pills, other means if she had access per her current report) and HI (no active plan) Needs inpatient behavioral health treatment but she may have to wait until she is out of COVID quarantine, will defer to psychiatry   Covid positive Mild symptoms of rhinorrhea and cough for maybe 3 days, thinks she was infected while at [facility] COVID test required by psychiatric facility ([facility]) - but they will require 7 day quarantine prior to admission given COVID Tessalon  perles prn cough She does not appear to need treatment for COVID infection at this time   Bipolar disorder IVC in place, plan is behavioral health admission once a bed is available (she is medically cleared at this time)   Arrhythmia Appears she was started on propafenone  in October but unclear why Possible afib vs. SVT - no evidence of either  with chart review or spot review of EKGs Stop propafenone   *She also reports recent diagnosis of lung cancer.  There is no obvious notation of this in the chart or current radiographic evidence of this available at this time.  Morbid obesity Body mass index is 41.79 kg/m.SABRA  Weight loss should be encouraged Outpatient PCP/bariatric medicine/bariatric surgery f/u encouraged      Consultants: Psychiatry Behavioral Health  Procedures: None  Antibiotics: None  30 Day Unplanned Readmission Risk Score    Flowsheet Row ED to Hosp-Admission (Discharged) [facility]  30 Day Unplanned Readmission Risk Score (%) 68.27 Filed at 01/15/2023 1200       This score is the patient's risk of an unplanned readmission within 30 days of being discharged (0 -100%). The score is based on dignosis, age, lab data, medications, orders, and past utilization.   Low:  0-14.9   Medium: 15-21.9   High: 22-29.9   Extreme: 30 and above           Subjective: Continues with SI/HI.  Reports that she was recently diagnosed with lung cancer.   Objective: Vitals:   02/19/23 0635 02/19/23 1340  BP: 137/63 121/69  Pulse: 98 (!) 107  Resp: 18 16  Temp: 97.9 F (36.6 C) 98 F (36.7 C)  SpO2: 98% 99%    Intake/Output Summary (Last 24 hours) at 02/19/2023 1447 Last data filed at 02/19/2023 1235 Gross per 24 hour  Intake 1068 ml  Output --  Net 1068 ml   Filed Weights   02/18/23 1324 02/19/23 0446 02/19/23  9364  Weight: 110.6 kg 110.6 kg 113.9 kg    Exam:  General:  Appears calm and comfortable and is in NAD Eyes:  EOMI, normal lids, iris ENT:  grossly normal hearing, lips & tongue, mmm Neck:  no LAD, masses or thyromegaly Cardiovascular:  RRR, no m/r/g. No LE edema.  Respiratory:   CTA bilaterally with no wheezes/rales/rhonchi.  Normal respiratory effort. Abdomen:  soft, NT, ND Skin:  no rash or induration seen on limited  exam Musculoskeletal:  grossly normal tone BUE/BLE, good ROM, no bony abnormality Psychiatric:  blunted mood and affect, speech fluent and appropriate with some apparent fabrication, AOx3 Neurologic:  CN 2-12 grossly intact, moves all extremities in coordinated fashion  Data Reviewed: I have reviewed the patient's lab results since admission.  Pertinent labs for today include:   COVID positive 1/13     Family Communication: None present  Disposition: Status is: Observation The patient remains OBS appropriate and will d/c before 2 midnights.     Time spent: 50 minutes  Unresulted Labs (From admission, onward)     Start     Ordered   02/20/23 0500  CBC with Differential/Platelet  Tomorrow morning,   R        02/19/23 1447   02/20/23 0500  Basic metabolic panel  Tomorrow morning,   R        02/19/23 1447             Author: Delon Herald, MD 02/19/2023 2:47 PM  For on call review [email or web address].

## 2023-02-20 DIAGNOSIS — F3112 Bipolar disorder, current episode manic without psychotic features, moderate: Secondary | ICD-10-CM

## 2023-02-20 DIAGNOSIS — T50902A Poisoning by unspecified drugs, medicaments and biological substances, intentional self-harm, initial encounter: Secondary | ICD-10-CM | POA: Diagnosis not present

## 2023-02-20 LAB — BASIC METABOLIC PANEL
Anion gap: 8 (ref 5–15)
BUN: 7 mg/dL (ref 6–20)
CO2: 25 mmol/L (ref 22–32)
Calcium: 8.7 mg/dL — ABNORMAL LOW (ref 8.9–10.3)
Chloride: 103 mmol/L (ref 98–111)
Creatinine, Ser: 0.65 mg/dL (ref 0.44–1.00)
GFR, Estimated: >60 mL/min (ref >60)
Glucose, Bld: 116 mg/dL — ABNORMAL HIGH (ref 70–99)
Potassium: 3.9 mmol/L (ref 3.5–5.1)
Sodium: 136 mmol/L (ref 135–145)

## 2023-02-20 LAB — CBC WITH DIFFERENTIAL/PLATELET
Abs Immature Granulocytes: 0.07 10*3/uL (ref 0.00–0.07)
Basophils Absolute: 0 10*3/uL (ref 0.0–0.1)
Basophils Relative: 1 %
Eosinophils Absolute: 0.1 10*3/uL (ref 0.0–0.5)
Eosinophils Relative: 2 %
HCT: 34.9 % — ABNORMAL LOW (ref 36.0–46.0)
Hemoglobin: 10.7 g/dL — ABNORMAL LOW (ref 12.0–15.0)
Immature Granulocytes: 1 %
Lymphocytes Relative: 28 %
Lymphs Abs: 1.7 10*3/uL (ref 0.7–4.0)
MCH: 24.9 pg — ABNORMAL LOW (ref 26.0–34.0)
MCHC: 30.7 g/dL (ref 30.0–36.0)
MCV: 81.4 fL (ref 80.0–100.0)
Monocytes Absolute: 0.7 10*3/uL (ref 0.1–1.0)
Monocytes Relative: 12 %
Neutro Abs: 3.3 10*3/uL (ref 1.7–7.7)
Neutrophils Relative %: 56 %
Platelets: 256 10*3/uL (ref 150–400)
RBC: 4.29 MIL/uL (ref 3.87–5.11)
RDW: 14.5 % (ref 11.5–15.5)
WBC: 5.9 10*3/uL (ref 4.0–10.5)
nRBC: 0 % (ref 0.0–0.2)

## 2023-02-20 MED ORDER — GUAIFENESIN ER 600 MG PO TB12
600.0000 mg | ORAL_TABLET | Freq: Two times a day (BID) | ORAL | Status: DC
  Administered 2023-02-20 – 2023-02-21 (×2): 600 mg via ORAL
  Filled 2023-02-20 (×2): qty 1

## 2023-02-20 MED ORDER — DULOXETINE HCL 30 MG PO CPEP
90.0000 mg | ORAL_CAPSULE | Freq: Every day | ORAL | Status: DC
Start: 2023-02-20 — End: 2023-02-21
  Administered 2023-02-20: 90 mg via ORAL
  Filled 2023-02-20: qty 3

## 2023-02-20 NOTE — Consult Note (Addendum)
 Patient is under review at [facility].  Patient has received 1 as needed dose in the past 24 hours of olanzapine  5 mg.  Patient does not appear to have any disruptive behaviors at this time.  Will continue to monitor from a psychiatric standpoint, no changes will be made today.  Psychiatry consult team to reassess tomorrow for any changes in her or improvement in current psychiatric condition.  -Continue current medications -Continue current one-to-one for suicide -If a sentence and then hospital may need to place under IVC at that time if patient refuses to sign in voluntarily.  [place]-No charge 1
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
## 2023-02-20 NOTE — Plan of Care (Signed)
  Problem: Education: Goal: Knowledge of General Education information will improve Description: Including pain rating scale, medication(s)/side effects and non-pharmacologic comfort measures Outcome: Progressing   Problem: Health Behavior/Discharge Planning: Goal: Ability to manage health-related needs will improve Outcome: Progressing   Problem: Clinical Measurements: Goal: Ability to maintain clinical measurements within normal limits will improve Outcome: Progressing Goal: Will remain free from infection Outcome: Progressing Goal: Diagnostic test results will improve Outcome: Progressing Goal: Respiratory complications will improve Outcome: Progressing Goal: Cardiovascular complication will be avoided Outcome: Progressing   Problem: Activity: Goal: Risk for activity intolerance will decrease Outcome: Progressing   Problem: Nutrition: Goal: Adequate nutrition will be maintained Outcome: Progressing   Problem: Coping: Goal: Level of anxiety will decrease Outcome: Progressing   Problem: Elimination: Goal: Will not experience complications related to bowel motility Outcome: Progressing Goal: Will not experience complications related to urinary retention Outcome: Progressing   Problem: Pain Management: Goal: General experience of comfort will improve Outcome: Progressing   Problem: Safety: Goal: Ability to remain free from injury will improve Outcome: Progressing   Problem: Skin Integrity: Goal: Risk for impaired skin integrity will decrease Outcome: Progressing   Problem: Education: Goal: Knowledge of risk factors and measures for prevention of condition will improve Outcome: Progressing   Problem: Coping: Goal: Psychosocial and spiritual needs will be supported Outcome: Progressing   Problem: Respiratory: Goal: Will maintain a patent airway Outcome: Progressing Goal: Complications related to the disease process, condition or treatment will be avoided or  minimized Outcome: Progressing

## 2023-02-20 NOTE — Progress Notes (Addendum)
 Inpatient Behavioral Health Placement  This CSW spoke with [facility], Angie and was informed pt will be presented for review this shift. Pt is being followed by [facility] inpatient at this time.      Andrew Banister, MSW, [facility] 02/20/2023 10:56 PM

## 2023-02-20 NOTE — Progress Notes (Signed)
 Progress Note   Patient: [name] GUY:[id number] DOB: 04/21/1990 DOA: 02/17/2023     0 DOS: the patient was seen and examined on 02/20/2023   Brief hospital course: 33yo with h/o depression with prior suicide attempts via overdose, moderate persistent asthma, bipolar d/o, preDM, and afib who presented on 1/12 with an intentional overdose of an unknown quantity of duloxetine  and propafenone .  She is awaiting inpatient psych treatment but had an incidental positive COVID test (asymptomatic), which may hinder placement.  Assessment and Plan:  Intentional drug overdose Patient reports recent 6 day admission at [facility], felt she needed to stay longer She has only been home a few days, developed recurrent SI/HI Starting taking inappropriate amounts of "all" home meds including duloxetine , propafenone  Poison control recommended q6 EKG until 1:30 PM on 1/13 to monitor qtc and qrs, bicarbonate if qrs greater than 140, keep k above 4 and mg above 2.  Monitoring is complete and she is medically stable for behavioral health admission She remained on 1/14 with active SI (would use scissors, pills, other means if she had access per her current report) and HI (no active plan) On 1/15, she reported some SI, no HI, and that she overdosed to avoid legal troubles Needs inpatient behavioral health treatment but she may have to wait until she is out of COVID quarantine, will defer to psychiatry She is currently becoming agitated and hitting her head (gently) against walls, psychiatry is aware [facility] will accept COVID patients on a case-by-case basis; awaiting notification about whether this is an option for her   Covid positive Mild symptoms of rhinorrhea and cough for maybe 3 days, thinks she was infected while at [facility] COVID test required by psychiatric facility ([facility]) - but they will require 7 day quarantine prior to admission given COVID Tessalon  perles prn cough, will add  Mucinex  She does not appear to need treatment for COVID infection at this time   Bipolar disorder IVC in place, plan is behavioral health admission once a bed is available (she is medically cleared at this time)   Arrhythmia Appears she was started on propafenone  in October but unclear why Possible afib vs. SVT - no evidence of either with chart review or spot review of EKGs Stop propafenone    *She also reports recent diagnosis of "lung cancer" and separately "6 months to live."  There is no obvious notation of this in the chart or current radiographic evidence is available at this time.   Morbid obesity Body mass index is 41.79 kg/m.Aaron Aas  Weight loss should be encouraged Outpatient PCP/bariatric medicine/bariatric surgery f/u encouraged          Consultants: Psychiatry Behavioral Health   Procedures: None   Antibiotics: None  30 Day Unplanned Readmission Risk Score    Flowsheet Row ED to Hosp-Admission (Discharged) [facility]  30 Day Unplanned Readmission Risk Score (%) 68.27 Filed at 01/15/2023 1200       This score is the patient's risk of an unplanned readmission within 30 days of being discharged (0 -100%). The score is based on dignosis, age, lab data, medications, orders, and past utilization.   Low:  0-14.9   Medium: 15-21.9   High: 22-29.9   Extreme: 30 and above           Subjective: Reported feeling some better this AM.  Persistent cough, less SI, no HI.   Objective: Vitals:   02/20/23 0553 02/20/23 1315  BP:  129/84 128/66  Pulse: 97 (!) 106  Resp: 18 20  Temp: 98 F (36.7 C) 98.6 F (37 C)  SpO2: 98% 98%    Intake/Output Summary (Last 24 hours) at 02/20/2023 1621 Last data filed at 02/20/2023 1245 Gross per 24 hour  Intake 834 ml  Output --  Net 834 ml   Filed Weights   02/19/23 0446 02/19/23 0635 02/20/23 0500  Weight: 110.6 kg 113.9 kg 112.7 kg    Exam:  General:  Appears calm  and comfortable and is in NAD Eyes:  EOMI, normal lids, iris ENT:  grossly normal hearing, lips & tongue, mmm Neck:  no LAD, masses or thyromegaly Cardiovascular:  RRR, no m/r/g. No LE edema.  Respiratory:   CTA bilaterally with no wheezes/rales/rhonchi.  Normal respiratory effort. Abdomen:  soft, NT, ND Skin:  no rash or induration seen on limited exam Musculoskeletal:  grossly normal tone BUE/BLE, good ROM, no bony abnormality Psychiatric:  blunted mood and affect, speech fluent and appropriate with some apparent fabrication, AOx3 Neurologic:  CN 2-12 grossly intact, moves all extremities in coordinated fashion  Data Reviewed: I have reviewed the patient's lab results since admission.  Pertinent labs for today include:   Glucose 116 WBC 5.9 Hgb 10.7     Family Communication: None present  Disposition: Status is: Observation The patient remains OBS appropriate and will d/c before 2 midnights.     Time spent: 35 minutes  Unresulted Labs (From admission, onward)    None        Author: Lorita Rosa, MD 02/20/2023 4:21 PM  For on call review [email or web address].

## 2023-02-20 NOTE — TOC Progression Note (Addendum)
 Transition of Care [facility]) - Progression Note    Patient Details  Name: [name] MRN: [id number] Date of Birth: 1990-10-24  Transition of Care [facility]) CM/SW Contact  Marty Sleet, LCSW Phone Number: 02/20/2023, 1:32 PM  Clinical Narrative:    [facility] still reviewing referral for placement. If pt is accepted pt will be required to complete isolation at their hospital.   ADDENDUM: VM Left for intake department at [facility] for update on placement for pt at their facility. Awaiting return call.   Expected Discharge Plan: Psychiatric Hospital Barriers to Discharge: Barriers Resolved  Expected Discharge Plan and Services In-house Referral: Clinical Social Work Discharge Planning Services: NA Post Acute Care Choice: NA Living arrangements for the past 2 months: Apartment                 DME Arranged: N/A DME Agency: NA                   Social Determinants of Health (SDOH) Interventions SDOH Screenings   Food Insecurity: No Food Insecurity (02/18/2023)  Housing: Patient Declined (02/18/2023)  Transportation Needs: Patient Unable To Answer (02/18/2023)  Utilities: Not At Risk (02/18/2023)  Alcohol  Screen: Low Risk  (12/19/2022)  Depression (PHQ2-9): Medium Risk (03/10/2022)  Tobacco Use: Medium Risk (02/18/2023)    Readmission Risk Interventions    06/06/2022    3:38 PM  Readmission Risk Prevention Plan  Transportation Screening Complete  Medication Review (RN Care Manager) Complete  PCP or Specialist appointment within 3-5 days of discharge Complete  HRI or Home Care Consult Complete  SW Recovery Care/Counseling Consult Complete  Palliative Care Screening Not Applicable  Skilled Nursing Facility Not Applicable

## 2023-02-21 DIAGNOSIS — F3112 Bipolar disorder, current episode manic without psychotic features, moderate: Secondary | ICD-10-CM | POA: Diagnosis not present

## 2023-02-21 DIAGNOSIS — T50902S Poisoning by unspecified drugs, medicaments and biological substances, intentional self-harm, sequela: Secondary | ICD-10-CM | POA: Diagnosis not present

## 2023-02-21 DIAGNOSIS — T50902A Poisoning by unspecified drugs, medicaments and biological substances, intentional self-harm, initial encounter: Secondary | ICD-10-CM | POA: Diagnosis not present

## 2023-02-21 DIAGNOSIS — T1491XA Suicide attempt, initial encounter: Secondary | ICD-10-CM

## 2023-02-21 MED ORDER — BENZONATATE 100 MG PO CAPS: 100.0000 mg | ORAL_CAPSULE | Freq: Three times a day (TID) | ORAL | Status: DC | PRN

## 2023-02-21 MED ORDER — DULOXETINE HCL 30 MG PO CPEP: 90.0000 mg | ORAL_CAPSULE | Freq: Every day | ORAL | Status: DC

## 2023-02-21 MED ORDER — LORAZEPAM 0.5 MG PO TABS: 0.5000 mg | ORAL_TABLET | Freq: Four times a day (QID) | ORAL | Status: DC | PRN

## 2023-02-21 MED ORDER — OLANZAPINE 5 MG PO TABS: 5.0000 mg | ORAL_TABLET | Freq: Three times a day (TID) | ORAL | Status: DC | PRN

## 2023-02-21 MED ORDER — GUAIFENESIN ER 600 MG PO TB12: 600.0000 mg | ORAL_TABLET | Freq: Two times a day (BID) | ORAL | Status: DC

## 2023-02-21 MED ORDER — OLANZAPINE 10 MG IM SOLR: 5.0000 mg | Freq: Two times a day (BID) | INTRAMUSCULAR | Status: DC | PRN

## 2023-02-21 NOTE — Progress Notes (Signed)
   02/21/23 0331  Assess: MEWS Score  Temp 97.8 F (36.6 C)  BP 106/62  MAP (mmHg) 74  Pulse Rate (!) 116  Resp 20  SpO2 98 %  O2 Device Room Air  Assess: MEWS Score  MEWS Temp 0  MEWS Systolic 0  MEWS Pulse 2  MEWS RR 0  MEWS LOC 0  MEWS Score 2  MEWS Score Color Yellow  Assess: SIRS CRITERIA  SIRS Temperature  0  SIRS Respirations  0  SIRS Pulse 1  SIRS WBC 0  SIRS Score Sum  1   Patient is anxious, walking around the room and sit up in chair. Vital signs rechecked.
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
## 2023-02-21 NOTE — Plan of Care (Signed)
  Problem: Education: Goal: Knowledge of General Education information will improve Description: Including pain rating scale, medication(s)/side effects and non-pharmacologic comfort measures Outcome: Progressing   Problem: Clinical Measurements: Goal: Ability to maintain clinical measurements within normal limits will improve Outcome: Progressing Goal: Will remain free from infection Outcome: Progressing Goal: Diagnostic test results will improve Outcome: Progressing Goal: Respiratory complications will improve Outcome: Progressing   Problem: Education: Goal: Knowledge of risk factors and measures for prevention of condition will improve Outcome: Progressing   Problem: Coping: Goal: Psychosocial and spiritual needs will be supported Outcome: Progressing

## 2023-02-21 NOTE — TOC Transition Note (Signed)
Transition of Care [facility]) - Discharge Note   Patient Details  Name: [name] MRN: [id number] Date of Birth: May 27, 1990  Transition of Care [facility]) CM/SW Contact:  Otelia Santee, LCSW Phone Number: 02/21/2023, 2:44 PM   Clinical Narrative:    Pt accepted to [facility]. Accepting provider is Dr. Jaclynn Major. RN to call report to [phone]. Sheriffs department called for transport at 2:40pm.    Final next level of care: Psychiatric Hospital Barriers to Discharge: Barriers Resolved   Patient Goals and CMS Choice Patient states their goals for this hospitalization and ongoing recovery are:: For pt to go to psychiatric hospital CMS Medicare.gov Compare Post Acute Care list provided to::  (NA) Choice offered to / list presented to :  (NA) [place] ownership interest in [facility].provided to::  (NA)    Discharge Placement              Patient chooses bed at: Other - please specify in the comment section below: ([facility]) Patient to be transferred to facility by: Law Enforcement      Discharge Plan and Services Additional resources added to the After Visit Summary for   In-house Referral: Clinical Social Work Discharge Planning Services: NA Post Acute Care Choice: NA          DME Arranged: N/A DME Agency: NA                  Social Drivers of Health (SDOH) Interventions SDOH Screenings   Food Insecurity: No Food Insecurity (02/18/2023)  Housing: Patient Declined (02/18/2023)  Transportation Needs: Patient Unable To Answer (02/18/2023)  Utilities: Not At Risk (02/18/2023)  Alcohol Screen: Low Risk  (12/19/2022)  Depression (PHQ2-9): Medium Risk (03/10/2022)  Tobacco Use: Medium Risk (02/18/2023)     Readmission Risk Interventions    06/06/2022    3:38 PM  Readmission Risk Prevention Plan  Transportation Screening Complete  Medication Review (RN Care Manager) Complete  PCP or Specialist appointment within 3-5 days of  discharge Complete  HRI or Home Care Consult Complete  SW Recovery Care/Counseling Consult Complete  Palliative Care Screening Not Applicable  Skilled Nursing Facility Not Applicable

## 2023-02-21 NOTE — Discharge Summary (Signed)
Physician Discharge Summary   Patient: [name] MRN: [id number] DOB: 07-10-1990  Admit date:     02/17/2023  Discharge date: 02/21/23  Discharge Physician: Jonah Blue   PCP: [facility]   Recommendations at discharge:   You are being discharged to [facility] for ongoing psychiatric care  Discharge Diagnoses: Principal Problem:   Intentional overdose (HCC) Active Problems:   Borderline personality disorder (HCC)   Lactic acidosis   Suicide attempt by drug ingestion (HCC)   Prolonged Q-T interval on ECG   Intentional overdose of drug in tablet form (HCC)   Intraventricular conduction delay   Moderate persistent asthma   Prediabetes   GERD (gastroesophageal reflux disease)   Paroxysmal atrial fibrillation (HCC)   Anemia of chronic disease   Bipolar affective disorder, currently manic, moderate [facility])   Hospital Course: 33yo with h/o depression with prior suicide attempts via overdose, moderate persistent asthma, bipolar d/o, preDM, and afib who presented on 1/12 with an intentional overdose of an unknown quantity of duloxetine and propafenone.  She is awaiting inpatient psych treatment but had an incidental positive COVID test (asymptomatic), which may hinder placement.  Assessment and Plan:  Intentional drug overdose Patient reports recent 6 day admission at [facility], felt she needed to stay longer She has only been home a few days, developed recurrent SI/HI Starting taking inappropriate amounts of "all" home meds including duloxetine, propafenone Poison control monitoring is complete and she is medically stable for behavioral health admission She became agitated with hitting her head (gently) against walls, psychiatry is aware She will benefit from inpatient psychiatric care and has been accepted to [facility] today,    Covid positive Mild symptoms of rhinorrhea and cough for maybe 3 days PTA, thinks she was infected while at [facility] COVID test required by psychiatric facility and she was found to be positive Mild symptoms, improving Continue Tessalon perles prn cough, Mucinex She does not appear to need treatment for COVID infection at this time   Bipolar disorder IVC in place, plan is behavioral health admission once a bed is available (she is medically cleared at this time)   Arrhythmia Appears she was started on propafenone in October but unclear why Possible afib vs. SVT - no evidence of either with chart review or spot review of EKGs Stop propafenone   *She also reports recent diagnosis of "lung cancer" and separately "6 months to live."  There is no obvious notation of this in the chart or current radiographic evidence is available at this time.   Morbid obesity Body mass index is 41.79 kg/m.Marland Kitchen  Weight loss should be encouraged Outpatient PCP/bariatric medicine/bariatric surgery f/u encouraged          Consultants: Psychiatry Behavioral Health   Procedures: None   Antibiotics: None  Pain control - [place] Controlled Substance Reporting System database was reviewed. and patient was instructed, not to drive, operate heavy machinery, perform activities at heights, swimming or participation in water activities or provide baby-sitting services while on Pain, Sleep and Anxiety Medications; until their outpatient Physician has advised to do so again. Also recommended to not to take more than prescribed Pain, Sleep and Anxiety Medications.   Disposition: [facility] for inpatient behavioral health under IVC Diet recommendation:  Regular diet DISCHARGE MEDICATION: Allergies as of 02/21/2023       Reactions   Bee Venom Anaphylaxis   Coconut Flavoring Agent (non-screening) Anaphylaxis, Rash   Fish Allergy Anaphylaxis   Geodon [ziprasidone  Hcl] Other (See Comments)   Pt states that this medication causes paralysis of the mouth.     Haloperidol And Related Other (See Comments)   Pt states  that this medication causes paralysis of the mouth, jaw locks up   Lithobid [lithium] Other (See Comments)   Seizure-like activity   Roxicodone [oxycodone] Other (See Comments)   Hallucinations    Seroquel [quetiapine] Other (See Comments)   Severe drowsiness    Shellfish Allergy Anaphylaxis   Phenergan [promethazine Hcl] Other (See Comments)   Chest pain    Prilosec [omeprazole] Nausea And Vomiting, Other (See Comments)   Pt can take protonix with no problems   Sulfa Antibiotics Other (See Comments)   Chest pain    Tegretol [carbamazepine] Nausea And Vomiting   Prozac [fluoxetine] Other (See Comments)   Increased Depression and Suicidal thoughts   Tape Other (See Comments)   Skin tears, can only tolerate paper tape.   Tylenol [acetaminophen] Rash, Other (See Comments)   Rash on face         Medication List     TAKE these medications    albuterol (2.5 MG/3ML) 0.083% nebulizer solution Commonly known as: PROVENTIL Take 3 mLs (2.5 mg total) by nebulization every 6 (six) hours as needed for wheezing or shortness of breath.   benzonatate 100 MG capsule Commonly known as: TESSALON Take 1 capsule (100 mg total) by mouth 3 (three) times daily as needed for cough.   cyclobenzaprine 10 MG tablet Commonly known as: FLEXERIL Take 10 mg by mouth at bedtime.   diclofenac Sodium 1 % Gel Commonly known as: VOLTAREN Apply 4 g topically 4 (four) times daily as needed (pain).   DULoxetine 30 MG capsule Commonly known as: CYMBALTA Take 3 capsules (90 mg total) by mouth at bedtime. What changed: when to take this   gabapentin 600 MG tablet Commonly known as: NEURONTIN Take 600 mg by mouth 3 (three) times daily.   guaiFENesin 600 MG 12 hr tablet Commonly known as: MUCINEX Take 1 tablet (600 mg total) by mouth 2 (two) times daily.   hydrOXYzine 25 MG tablet Commonly known as: ATARAX Take 1 tablet (25 mg total) by mouth 3 (three) times daily as needed for anxiety.   LORazepam  0.5 MG tablet Commonly known as: ATIVAN Take 1 tablet (0.5 mg total) by mouth 4 (four) times daily as needed for anxiety.   melatonin 3 MG Tabs tablet Take 1 tablet (3 mg total) by mouth at bedtime. What changed:  when to take this reasons to take this   metFORMIN 500 MG tablet Commonly known as: GLUCOPHAGE Take 500 mg by mouth daily.   OLANZapine 5 MG tablet Commonly known as: ZYPREXA Take 1 tablet (5 mg total) by mouth 3 (three) times daily as needed (significant agitation w/ threat of harm to self or others. Ordered TID not q8 OK to give early. Will adjust dosing tomorrow.).   OLANZapine injection Commonly known as: ZYPREXA Inject 1 mL (5 mg total) into the muscle 2 (two) times daily as needed for agitation (Significant agitation with threat of harm to self or others AND refusal of PO option. Do not give within 30 min of IV or IM benzodiazepine.).   ondansetron 4 MG disintegrating tablet Commonly known as: ZOFRAN-ODT Take 1 tablet (4 mg total) by mouth every 8 (eight) hours as needed for nausea or vomiting.   pantoprazole 40 MG tablet Commonly known as: PROTONIX Take 1 tablet (40 mg total) by mouth daily  before breakfast.   prazosin 2 MG capsule Commonly known as: MINIPRESS Take 2 mg by mouth at bedtime.   risperiDONE 2 MG tablet Commonly known as: RISPERDAL Take 2 mg by mouth 2 (two) times daily.   Symbicort 160-4.5 MCG/ACT inhaler Generic drug: budesonide-formoterol Inhale 2 puffs into the lungs in the morning and at bedtime.        Discharge Exam:   Subjective: Reports that her COVID symptoms are better, depression is worse.   Objective: Vitals:   02/21/23 0331 02/21/23 0500  BP: 106/62   Pulse: (!) 116 87  Resp: 20   Temp: 97.8 F (36.6 C)   SpO2: 98%     Intake/Output Summary (Last 24 hours) at 02/21/2023 1233 Last data filed at 02/21/2023 1100 Gross per 24 hour  Intake 360 ml  Output --  Net 360 ml   Filed Weights   02/19/23 0446 02/19/23  0635 02/20/23 0500  Weight: 110.6 kg 113.9 kg 112.7 kg    Exam:  General:  Appears calm and comfortable and is in NAD, lying on the window seat Eyes:  EOMI, normal lids, iris ENT:  grossly normal hearing, lips & tongue, mmm Neck:  no LAD, masses or thyromegaly Cardiovascular:  RRR, no m/r/g. No LE edema.  Respiratory:   CTA bilaterally with no wheezes/rales/rhonchi.  Normal respiratory effort. Abdomen:  soft, NT, ND Skin:  no rash or induration seen on limited exam Musculoskeletal:  grossly normal tone BUE/BLE, good ROM, no bony abnormality Psychiatric:  blunted mood and affect, speech fluent and appropriate with some apparent fabrication, AOx3 Neurologic:  CN 2-12 grossly intact, moves all extremities in coordinated fashion  Data Reviewed: I have reviewed the patient's lab results since admission.  Pertinent labs for today include:   None today    Condition at discharge: poor  The results of significant diagnostics from this hospitalization (including imaging, microbiology, ancillary and laboratory) are listed below for reference.   Imaging Studies: DG Chest 2 View Result Date: 01/22/2023 CLINICAL DATA:  Chest pain on left side. EXAM: CHEST - 2 VIEW COMPARISON:  Radiograph 08/22/2022. FINDINGS: The heart is normal in size. Mediastinal contours are normal. Subsegmental opacity in the perihilar and infrahilar regions may be atelectasis or scarring. Previous lung opacities have otherwise resolved. No pleural effusion or pneumothorax. Normal pulmonary vasculature. No acute osseous findings. IMPRESSION: Subsegmental opacity in the perihilar and infrahilar regions may be atelectasis or scarring. Electronically Signed   By: Narda Rutherford M.D.   On: 01/22/2023 21:42    Microbiology: Results for orders placed or performed during the hospital encounter of 02/17/23  SARS Coronavirus 2 by RT PCR (hospital order, performed in [facility] hospital lab) *cepheid single result test* Anterior  Nasal Swab     Status: Abnormal   Collection Time: 02/18/23  2:54 PM   Specimen: Anterior Nasal Swab  Result Value Ref Range Status   SARS Coronavirus 2 by RT PCR POSITIVE (A) NEGATIVE Final    Comment: (NOTE) SARS-CoV-2 target nucleic acids are DETECTED  SARS-CoV-2 RNA is generally detectable in upper respiratory specimens  during the acute phase of infection.  Positive results are indicative  of the presence of the identified virus, but do not rule out bacterial infection or co-infection with other pathogens not detected by the test.  Clinical correlation with patient history and  other diagnostic information is necessary to determine patient infection status.  The expected result is negative.  Fact Sheet for Patients:   RoadLapTop.co.za  Fact Sheet for Healthcare Providers:   http://kim-miller.com/    This test is not yet approved or cleared by the [place] FDA and  has been authorized for detection and/or diagnosis of SARS-CoV-2 by FDA under an Emergency Use Authorization (EUA).  This EUA will remain in effect (meaning this test can be used) for the duration of  the COVID-19 declaration under Section 564(b)(1)  of the Act, 21 U.S.C. section 360-bbb-3(b)(1), unless the authorization is terminated or revoked sooner.   Performed at [facility], [address], [place], [place] [address]     Labs: CBC: Recent Labs  Lab 02/17/23 0955 02/18/23 0638 02/20/23 0550  WBC 8.9 8.6 5.9  NEUTROABS  --  6.0 3.3  HGB 10.1* 11.6* 10.7*  HCT 33.8* 37.3 34.9*  MCV 83.7 82.2 81.4  PLT 261 303 256   Basic Metabolic Panel: Recent Labs  Lab 02/17/23 0440 02/17/23 1203 02/17/23 2000 02/18/23 0638 02/20/23 0550  NA 137 137 136 137 136  K 3.7 3.6 4.4 4.3 3.9  CL 105 114* 109 106 103  CO2 23 19* 21* 23 25  GLUCOSE 113* 87 99 103* 116*  BUN 6 6 7 8 7   CREATININE 0.85 0.60 1.91 0.77 0.65  CALCIUM 8.9 7.4*  9.0 9.3 8.7*  MG 1.9 2.1  --  2.0  --    Liver Function Tests: Recent Labs  Lab 02/17/23 0440 02/18/23 0638  AST 15 15  ALT 19 21  ALKPHOS 89 80  BILITOT 0.5 0.4  PROT 7.2 7.4  ALBUMIN 3.9 3.7   CBG: No results for input(s): "GLUCAP" in the last 168 hours.  Discharge time spent: greater than 30 minutes.  Signed: Jonah Blue, MD [facility] 02/21/2023

## 2023-02-21 NOTE — TOC Progression Note (Addendum)
Transition of Care [facility]) - Progression Note    Patient Details  Name: [name] MRN: [id number] Date of Birth: 1990/09/23  Transition of [facility]) CM/SW Contact  Otelia Santee, LCSW Phone Number: 02/21/2023, 9:21 AM  Clinical Narrative:    Spoke with Beth w/ intake at [facility] who shares that they have not reviewed outside referrals in the past 36 hours. When CSW mentioned that CSW was told to resend referral the past two days and asked if referral would be reviewed today this CSW was informed that their "hospital has other things they are dealing with at this moment" and could not guarantee when referral would be reviewed.  TOC will continue to seek placement at alternate facilities.   Referrals resent out to psych facilities for review.  Expected Discharge Plan: Psychiatric Hospital Barriers to Discharge: Barriers Resolved  Expected Discharge Plan and Services In-house Referral: Clinical Social Work Discharge Planning Services: NA Post Acute Care Choice: NA Living arrangements for the past 2 months: Apartment                 DME Arranged: N/A DME Agency: NA                   Social Determinants of Health (SDOH) Interventions SDOH Screenings   Food Insecurity: No Food Insecurity (02/18/2023)  Housing: Patient Declined (02/18/2023)  Transportation Needs: Patient Unable To Answer (02/18/2023)  Utilities: Not At Risk (02/18/2023)  Alcohol Screen: Low Risk  (12/19/2022)  Depression (PHQ2-9): Medium Risk (03/10/2022)  Tobacco Use: Medium Risk (02/18/2023)    Readmission Risk Interventions    06/06/2022    3:38 PM  Readmission Risk Prevention Plan  Transportation Screening Complete  Medication Review (RN Care Manager) Complete  PCP or Specialist appointment within 3-5 days of discharge Complete  HRI or Home Care Consult Complete  SW Recovery Care/Counseling Consult Complete  Palliative Care Screening Not Applicable  Skilled Nursing Facility Not Applicable

## 2023-03-16 ENCOUNTER — Other Ambulatory Visit: Payer: Self-pay

## 2023-03-16 ENCOUNTER — Encounter (HOSPITAL_COMMUNITY): Payer: Self-pay

## 2023-03-16 ENCOUNTER — Emergency Department (HOSPITAL_COMMUNITY)
Admission: EM | Admit: 2023-03-16 | Discharge: 2023-03-16 | Discharge status: Against Medical Advice | Payer: Medicare PPO | Attending: Emergency Medicine | Admitting: Emergency Medicine

## 2023-03-16 DIAGNOSIS — Z5321 Procedure and treatment not carried out due to patient leaving prior to being seen by health care provider: Secondary | ICD-10-CM | POA: Diagnosis not present

## 2023-03-16 DIAGNOSIS — Z760 Encounter for issue of repeat prescription: Secondary | ICD-10-CM | POA: Insufficient documentation

## 2023-03-16 NOTE — ED Triage Notes (Signed)
 Pt received medications from ACT team and none of the medications are labeled what they are. Pt states she is not going to take them if she doesn't know what they are. Medications are in a pill pack, sectioned out per day and time

## 2023-04-08 ENCOUNTER — Ambulatory Visit (HOSPITAL_COMMUNITY)
Admission: EM | Admit: 2023-04-08 | Discharge: 2023-04-09 | Disposition: A | Discharge status: Home / Self Care | Attending: Psychiatry | Admitting: Psychiatry

## 2023-04-08 DIAGNOSIS — R45851 Suicidal ideations: Secondary | ICD-10-CM | POA: Insufficient documentation

## 2023-04-08 DIAGNOSIS — F3289 Other specified depressive episodes: Secondary | ICD-10-CM | POA: Diagnosis not present

## 2023-04-08 DIAGNOSIS — R000 Tachycardia, unspecified: Secondary | ICD-10-CM | POA: Diagnosis not present

## 2023-04-08 DIAGNOSIS — F250 Schizoaffective disorder, bipolar type: Secondary | ICD-10-CM

## 2023-04-08 DIAGNOSIS — Z7951 Long term (current) use of inhaled steroids: Secondary | ICD-10-CM | POA: Insufficient documentation

## 2023-04-08 DIAGNOSIS — F32A Depression, unspecified: Secondary | ICD-10-CM | POA: Insufficient documentation

## 2023-04-08 DIAGNOSIS — F603 Borderline personality disorder: Secondary | ICD-10-CM | POA: Diagnosis not present

## 2023-04-08 DIAGNOSIS — J45909 Unspecified asthma, uncomplicated: Secondary | ICD-10-CM | POA: Insufficient documentation

## 2023-04-08 DIAGNOSIS — R440 Auditory hallucinations: Secondary | ICD-10-CM

## 2023-04-08 DIAGNOSIS — Z862 Personal history of diseases of the blood and blood-forming organs and certain disorders involving the immune mechanism: Secondary | ICD-10-CM | POA: Diagnosis not present

## 2023-04-08 DIAGNOSIS — K219 Gastro-esophageal reflux disease without esophagitis: Secondary | ICD-10-CM | POA: Insufficient documentation

## 2023-04-08 DIAGNOSIS — F411 Generalized anxiety disorder: Secondary | ICD-10-CM

## 2023-04-08 DIAGNOSIS — F4310 Post-traumatic stress disorder, unspecified: Secondary | ICD-10-CM

## 2023-04-08 DIAGNOSIS — Z9151 Personal history of suicidal behavior: Secondary | ICD-10-CM | POA: Diagnosis not present

## 2023-04-08 DIAGNOSIS — Z79899 Other long term (current) drug therapy: Secondary | ICD-10-CM | POA: Diagnosis not present

## 2023-04-08 LAB — POCT URINE DRUG SCREEN - MANUAL ENTRY (I-SCREEN)
POC Amphetamine UR: NOT DETECTED
POC Buprenorphine (BUP): NOT DETECTED
POC Cocaine UR: NOT DETECTED
POC Marijuana UR: NOT DETECTED
POC Methadone UR: NOT DETECTED
POC Methamphetamine UR: NOT DETECTED
POC Morphine: NOT DETECTED
POC Oxazepam (BZO): NOT DETECTED
POC Oxycodone UR: NOT DETECTED
POC Secobarbital (BAR): NOT DETECTED

## 2023-04-08 LAB — POC URINE PREG, ED: Preg Test, Ur: NEGATIVE

## 2023-04-08 MED ORDER — ALUM & MAG HYDROXIDE-SIMETH 200-200-20 MG/5ML PO SUSP: 30.0000 mL | ORAL | Status: DC | PRN

## 2023-04-08 MED ORDER — OLANZAPINE 10 MG IM SOLR: 5.0000 mg | Freq: Three times a day (TID) | INTRAMUSCULAR | Status: DC | PRN

## 2023-04-08 MED ORDER — MAGNESIUM HYDROXIDE 400 MG/5ML PO SUSP: 30.0000 mL | Freq: Every day | ORAL | Status: DC | PRN

## 2023-04-08 MED ORDER — OLANZAPINE 5 MG PO TBDP
5.0000 mg | ORAL_TABLET | Freq: Three times a day (TID) | ORAL | Status: DC | PRN
  Administered 2023-04-09: 5 mg via ORAL
  Filled 2023-04-08: qty 1

## 2023-04-08 MED ORDER — OLANZAPINE 10 MG IM SOLR: 10.0000 mg | Freq: Three times a day (TID) | INTRAMUSCULAR | Status: DC | PRN

## 2023-04-08 NOTE — ED Provider Notes (Signed)
 [facility] Continuous Assessment Admission H&P  Date: 04/08/23 Patient Name: Teyanna Thielman MRN: [id number] Chief Complaint: auditory hallucination   Diagnoses:  Final diagnoses:  Auditory hallucination  Schizoaffective disorder, bipolar type (HCC)  Other depression    HPI: Misa Fedorko, 33 y/o female with a history of schizoaffective disorder, bipolar disorder, suicidal ideation, intentional overdose, depression.  Presented to [facility] via [employer].  Per the patient she has been having auditory hallucination for the past 2 weeks but denied it is getting worse according to the patient she hears voices telling her she is going to go to prison or she is going to be put in a group home.  Patient does have an ACT team and with [facility], per the patient she reached out to them but they did not do anything.  Patient does not appear to be in any distress at this time.  Reports she is currently compliant with all her medications.  According to the patient she does have her apartment where she is staying at.  Face-to-face evaluation of patient, patient is alert and oriented x 4, speech is clear, maintain eye contact.  Patient does not appear to be in any distress at this point.  Patient answer questions appropriately.  Patient denies SI, HI, report hearing voices telling her she is going to go to prison or going to a group home.  Patient denies current alcohol use stating that she only drink once every 3 months.  Patient denies illicit drug or smoking.  Patient does not seem to be influenced by external stimuli.  Patient does not pose a risk to herself at this time.  Given patient history and hospitalization, recommend inpatient observation.  Total Time spent with patient: 20 minutes  Musculoskeletal  Strength & Muscle Tone: within normal limits Gait & Station: normal Patient leans: N/A  Psychiatric Specialty Exam  Presentation General Appearance:  Casual  Eye  Contact: Good  Speech: Clear and Coherent  Speech Volume: Normal  Handedness: Right   Mood and Affect  Mood: Anxious; Depressed  Affect: Congruent   Thought Process  Thought Processes: Coherent  Descriptions of Associations:Intact  Orientation:Full (Time, Place and Person)  Thought Content:Illogical  Diagnosis of Schizophrenia or Schizoaffective disorder in past: Yes  Duration of Psychotic Symptoms: Greater than six months  Hallucinations:Hallucinations: Auditory Description of Auditory Hallucinations: voices telling her she either going to prison or a group home  Ideas of Reference:None  Suicidal Thoughts:Suicidal Thoughts: No  Homicidal Thoughts:Homicidal Thoughts: No   Sensorium  Memory: Immediate Good  Judgment: Fair  Insight: Fair   Art therapist  Concentration: Good  Attention Span: Good  Recall: Good  Fund of Knowledge: Good  Language: Good   Psychomotor Activity  Psychomotor Activity: Psychomotor Activity: Normal   Assets  Assets: Desire for Improvement   Sleep  Sleep: Sleep: Fair Number of Hours of Sleep: 8   Nutritional Assessment (For OBS and FBC admissions only) Has the patient had a weight loss or gain of 10 pounds or more in the last 3 months?: No Has the patient had a decrease in food intake/or appetite?: No Does the patient have dental problems?: No Does the patient have eating habits or behaviors that may be indicators of an eating disorder including binging or inducing vomiting?: No Has the patient recently lost weight without trying?: 0 Has the patient been eating poorly because of a decreased appetite?: 0 Malnutrition Screening Tool Score: 0    Physical Exam HENT:  Head: Normocephalic.     Nose: Nose normal.  Eyes:     Pupils: Pupils are equal, round, and reactive to light.  Cardiovascular:     Rate and Rhythm: Normal rate.  Pulmonary:     Effort: Pulmonary effort is normal.   Musculoskeletal:        General: Normal range of motion.     Cervical back: Normal range of motion.  Neurological:     General: No focal deficit present.     Mental Status: She is alert.  Psychiatric:        Mood and Affect: Mood normal.        Behavior: Behavior normal.        Thought Content: Thought content normal.        Judgment: Judgment normal.    Review of Systems  Constitutional: Negative.   HENT: Negative.    Eyes: Negative.   Respiratory: Negative.    Cardiovascular: Negative.   Gastrointestinal: Negative.   Genitourinary: Negative.   Musculoskeletal: Negative.   Skin: Negative.   Neurological: Negative.   Psychiatric/Behavioral:  Positive for depression and hallucinations.     Blood pressure 115/83, pulse (!) 125, temperature 98.4 F (36.9 C), temperature source Oral, resp. rate 18, SpO2 95%. There is no height or weight on file to calculate BMI.  Past Psychiatric History: Schizoaffective disorder, suicidal ideation, overdose, depression  Is the patient at risk to self? No  Has the patient been a risk to self in the past 6 months? Yes .    Has the patient been a risk to self within the distant past? Yes   Is the patient a risk to others? No   Has the patient been a risk to others in the past 6 months? No   Has the patient been a risk to others within the distant past? No   Past Medical History: see chart   Family History: unknown  Social History: alcohol   Last Labs:  Admission on 02/17/2023, Discharged on 02/21/2023  Component Date Value Ref Range Status   Opiates 02/17/2023 NONE DETECTED  NONE DETECTED Final   Cocaine 02/17/2023 NONE DETECTED  NONE DETECTED Final   Benzodiazepines 02/17/2023 NONE DETECTED  NONE DETECTED Final   Amphetamines 02/17/2023 NONE DETECTED  NONE DETECTED Final   Tetrahydrocannabinol 02/17/2023 NONE DETECTED  NONE DETECTED Final   Barbiturates 02/17/2023 NONE DETECTED  NONE DETECTED Final   Comment: (NOTE) DRUG SCREEN FOR  MEDICAL PURPOSES ONLY.  IF CONFIRMATION IS NEEDED FOR ANY PURPOSE, NOTIFY LAB WITHIN 5 DAYS.  LOWEST DETECTABLE LIMITS FOR URINE DRUG SCREEN Drug Class                     Cutoff (ng/mL) Amphetamine and metabolites    1000 Barbiturate and metabolites    200 Benzodiazepine                 200 Opiates and metabolites        300 Cocaine and metabolites        300 THC                            50 Performed at [facility], [address], [place], [place] [address]    Preg, Serum 02/17/2023 NEGATIVE  NEGATIVE Final   Comment:        THE SENSITIVITY OF THIS METHODOLOGY IS >10 mIU/mL. Performed at [facility], [address] [place], [place] [address]    Sodium 02/17/2023 137  135 - 145 mmol/L Final   Potassium 02/17/2023 3.7  3.5 - 5.1 mmol/L Final   Chloride 02/17/2023 105  98 - 111 mmol/L Final   CO2 02/17/2023 23  22 - 32 mmol/L Final   Glucose, Bld 02/17/2023 113 (H)  70 - 99 mg/dL Final   Glucose reference range applies only to samples taken after fasting for at least 8 hours.   BUN 02/17/2023 6  6 - 20 mg/dL Final   Creatinine, Ser 02/17/2023 0.85  0.44 - 1.00 mg/dL Final   Calcium 34/74/2595 8.9  8.9 - 10.3 mg/dL Final   Total Protein 63/87/5643 7.2  6.5 - 8.1 g/dL Final   Albumin 32/95/1884 3.9  3.5 - 5.0 g/dL Final   AST 16/60/6301 15  15 - 41 U/L Final   ALT 02/17/2023 19  0 - 44 U/L Final   Alkaline Phosphatase 02/17/2023 89  38 - 126 U/L Final   Total Bilirubin 02/17/2023 0.5  0.0 - 1.2 mg/dL Final   GFR, Estimated 02/17/2023 >60  >60 mL/min Final   Comment: (NOTE) Calculated using the CKD-EPI Creatinine Equation (2021)    Anion gap 02/17/2023 9  5 - 15 Final   Performed at [facility], [address] [place], [place] [address]   Magnesium 02/17/2023 1.9  1.7 - 2.4 mg/dL Final   Performed at [facility], [address] [place], [place] [address]   Lactic Acid, Venous 02/17/2023 2.0  (HH)  0.5 - 1.9 mmol/L Final   Acetaminophen (Tylenol), Serum 02/17/2023 <10 (L)  10 - 30 ug/mL Final   Comment: (NOTE) Therapeutic concentrations vary significantly. A range of 10-30 ug/mL  may be an effective concentration for many patients. However, some  are best treated at concentrations outside of this range. Acetaminophen concentrations >150 ug/mL at 4 hours after ingestion  and >50 ug/mL at 12 hours after ingestion are often associated with  toxic reactions.  Performed at [facility], [address], [place], [place] [address]    Alcohol, Ethyl (B) 02/17/2023 <10  <10 mg/dL Final   Comment: (NOTE) Lowest detectable limit for serum alcohol is 10 mg/dL.  For medical purposes only. Performed at [facility], [address] [place], [place] [address]    Salicylate Lvl 02/17/2023 <7.0 (L)  7.0 - 30.0 mg/dL Final   Performed at [facility], [address], [place], [place] [address]   WBC 02/17/2023 8.9  4.0 - 10.5 K/uL Final   RBC 02/17/2023 4.04  3.87 - 5.11 MIL/uL Final   Hemoglobin 02/17/2023 10.1 (L)  12.0 - 15.0 g/dL Final   HCT 62/83/1517 33.8 (L)  36.0 - 46.0 % Final   MCV 02/17/2023 83.7  80.0 - 100.0 fL Final   MCH 02/17/2023 25.0 (L)  26.0 - 34.0 pg Final   MCHC 02/17/2023 29.9 (L)  30.0 - 36.0 g/dL Final   RDW 61/60/7371 14.2  11.5 - 15.5 % Final   Platelets 02/17/2023 261  150 - 400 K/uL Final   nRBC 02/17/2023 0.0  0.0 - 0.2 % Final   Performed at [facility], [address], [place], [place] [address]   Sodium 02/17/2023 137  135 - 145 mmol/L Final   Potassium 02/17/2023 3.6  3.5 - 5.1 mmol/L Final   Chloride 02/17/2023 114 (H)  98 - 111 mmol/L Final   CO2 02/17/2023 19 (  L)  22 - 32 mmol/L Final   Glucose, Bld 02/17/2023 87  70 - 99 mg/dL Final   Glucose reference range applies only to samples taken after fasting for at least 8 hours.   BUN 02/17/2023 6  6 - 20 mg/dL Final    Creatinine, Ser 02/17/2023 0.60  0.44 - 1.00 mg/dL Final   Calcium 40/98/1191 7.4 (L)  8.9 - 10.3 mg/dL Final   GFR, Estimated 02/17/2023 >60  >60 mL/min Final   Comment: (NOTE) Calculated using the CKD-EPI Creatinine Equation (2021)    Anion gap 02/17/2023 4 (L)  5 - 15 Final   Performed at [facility], [address] [place], [place] [address]   Magnesium 02/17/2023 2.1  1.7 - 2.4 mg/dL Final   Performed at [facility], [address] [place], [place] [address]   Sodium 02/17/2023 136  135 - 145 mmol/L Final   Potassium 02/17/2023 4.4  3.5 - 5.1 mmol/L Final   Chloride 02/17/2023 109  98 - 111 mmol/L Final   CO2 02/17/2023 21 (L)  22 - 32 mmol/L Final   Glucose, Bld 02/17/2023 99  70 - 99 mg/dL Final   Glucose reference range applies only to samples taken after fasting for at least 8 hours.   BUN 02/17/2023 7  6 - 20 mg/dL Final   Creatinine, Ser 02/17/2023 0.58  0.44 - 1.00 mg/dL Final   Calcium 08/65/7846 9.0  8.9 - 10.3 mg/dL Final   GFR, Estimated 02/17/2023 >60  >60 mL/min Final   Comment: (NOTE) Calculated using the CKD-EPI Creatinine Equation (2021)    Anion gap 02/17/2023 6  5 - 15 Final   Performed at [facility], [address], [place], [place] [address]   WBC 02/18/2023 8.6  4.0 - 10.5 K/uL Final   RBC 02/18/2023 4.54  3.87 - 5.11 MIL/uL Final   Hemoglobin 02/18/2023 11.6 (L)  12.0 - 15.0 g/dL Final   HCT 28/41/3244 37.3  36.0 - 46.0 % Final   MCV 02/18/2023 82.2  80.0 - 100.0 fL Final   MCH 02/18/2023 25.6 (L)  26.0 - 34.0 pg Final   MCHC 02/18/2023 31.1  30.0 - 36.0 g/dL Final   RDW 02/07/7251 14.5  11.5 - 15.5 % Final   Platelets 02/18/2023 303  150 - 400 K/uL Final   nRBC 02/18/2023 0.0  0.0 - 0.2 % Final   Neutrophils Relative % 02/18/2023 69  % Final   Neutro Abs 02/18/2023 6.0  1.7 - 7.7 K/uL Final   Lymphocytes Relative 02/18/2023 21  % Final   Lymphs Abs 02/18/2023 1.9  0.7 - 4.0 K/uL Final    Monocytes Relative 02/18/2023 7  % Final   Monocytes Absolute 02/18/2023 0.6  0.1 - 1.0 K/uL Final   Eosinophils Relative 02/18/2023 1  % Final   Eosinophils Absolute 02/18/2023 0.1  0.0 - 0.5 K/uL Final   Basophils Relative 02/18/2023 1  % Final   Basophils Absolute 02/18/2023 0.0  0.0 - 0.1 K/uL Final   Immature Granulocytes 02/18/2023 1  % Final   Abs Immature Granulocytes 02/18/2023 0.05  0.00 - 0.07 K/uL Final   Performed at [facility], [address] [place], [place] [address]   Sodium 02/18/2023 137  135 - 145 mmol/L Final   Potassium 02/18/2023 4.3  3.5 - 5.1 mmol/L Final   Chloride 02/18/2023 106  98 - 111 mmol/L Final   CO2 02/18/2023 23  22 -  32 mmol/L Final   Glucose, Bld 02/18/2023 103 (H)  70 - 99 mg/dL Final   Glucose reference range applies only to samples taken after fasting for at least 8 hours.   BUN 02/18/2023 8  6 - 20 mg/dL Final   Creatinine, Ser 02/18/2023 0.77  0.44 - 1.00 mg/dL Final   Calcium 52/84/1324 9.3  8.9 - 10.3 mg/dL Final   Total Protein 40/11/2723 7.4  6.5 - 8.1 g/dL Final   Albumin 36/64/4034 3.7  3.5 - 5.0 g/dL Final   AST 74/25/9563 15  15 - 41 U/L Final   ALT 02/18/2023 21  0 - 44 U/L Final   Alkaline Phosphatase 02/18/2023 80  38 - 126 U/L Final   Total Bilirubin 02/18/2023 0.4  0.0 - 1.2 mg/dL Final   GFR, Estimated 02/18/2023 >60  >60 mL/min Final   Comment: (NOTE) Calculated using the CKD-EPI Creatinine Equation (2021)    Anion gap 02/18/2023 8  5 - 15 Final   Performed at [facility], [address], [place], [place] [address]   Magnesium 02/18/2023 2.0  1.7 - 2.4 mg/dL Final   Performed at [facility], [address], [place], [place] [address]   Lactic Acid, Venous 02/18/2023 1.1  0.5 - 1.9 mmol/L Final   Performed at [facility], [address], [place], [place] [address]   Total CK 02/18/2023 29 (L)  38 - 234 U/L Final   Performed at [facility], [address], [place], [place] [address]   Prothrombin Time 02/18/2023 14.7  11.4 - 15.2 seconds Final   INR 02/18/2023 1.1  0.8 - 1.2 Final   Comment: (NOTE) INR goal varies based on device and disease states. Performed at [facility], [address], [place], [place] [address]    SARS Coronavirus 2 by RT PCR 02/18/2023 POSITIVE (A)  NEGATIVE Final   Comment: (NOTE) SARS-CoV-2 target nucleic acids are DETECTED  SARS-CoV-2 RNA is generally detectable in upper respiratory specimens  during the acute phase of infection.  Positive results are indicative  of the presence of the identified virus, but do not rule out bacterial infection or co-infection with other pathogens not detected by the test.  Clinical correlation with patient history and  other diagnostic information is necessary to determine patient infection status.  The expected result is negative.  Fact Sheet for Patients:   RoadLapTop.co.za   Fact Sheet for Healthcare Providers:   http://kim-miller.com/    This test is not yet approved or cleared by the [place] FDA and  has been authorized for detection and/or diagnosis of SARS-CoV-2 by FDA under an Emergency Use Authorization (EUA).  This EUA will remain in effect (meaning this test can be used) for the duration of  the COVID-19 declaration under Section 564(b)(1)                           of the Act, 21 U.S.C. section 360-bbb-3(b)(1), unless the authorization is terminated or revoked sooner.   Performed at [facility], [address], [place], [place] [address]    WBC 02/20/2023 5.9  4.0 - 10.5 K/uL Final   RBC 02/20/2023 4.29  3.87 - 5.11 MIL/uL Final   Hemoglobin 02/20/2023 10.7 (L)  12.0 - 15.0 g/dL Final   HCT 55/73/2202 34.9 (L)  36.0 - 46.0 % Final   MCV 02/20/2023 81.4  80.0 - 100.0  fL Final   MCH 02/20/2023 24.9 (L)  26.0 - 34.0 pg Final   MCHC  02/20/2023 30.7  30.0 - 36.0 g/dL Final   RDW 16/11/9602 14.5  11.5 - 15.5 % Final   Platelets 02/20/2023 256  150 - 400 K/uL Final   nRBC 02/20/2023 0.0  0.0 - 0.2 % Final   Neutrophils Relative % 02/20/2023 56  % Final   Neutro Abs 02/20/2023 3.3  1.7 - 7.7 K/uL Final   Lymphocytes Relative 02/20/2023 28  % Final   Lymphs Abs 02/20/2023 1.7  0.7 - 4.0 K/uL Final   Monocytes Relative 02/20/2023 12  % Final   Monocytes Absolute 02/20/2023 0.7  0.1 - 1.0 K/uL Final   Eosinophils Relative 02/20/2023 2  % Final   Eosinophils Absolute 02/20/2023 0.1  0.0 - 0.5 K/uL Final   Basophils Relative 02/20/2023 1  % Final   Basophils Absolute 02/20/2023 0.0  0.0 - 0.1 K/uL Final   Immature Granulocytes 02/20/2023 1  % Final   Abs Immature Granulocytes 02/20/2023 0.07  0.00 - 0.07 K/uL Final   Performed at [facility], [address], [place], [place] [address]   Sodium 02/20/2023 136  135 - 145 mmol/L Final   Potassium 02/20/2023 3.9  3.5 - 5.1 mmol/L Final   Chloride 02/20/2023 103  98 - 111 mmol/L Final   CO2 02/20/2023 25  22 - 32 mmol/L Final   Glucose, Bld 02/20/2023 116 (H)  70 - 99 mg/dL Final   Glucose reference range applies only to samples taken after fasting for at least 8 hours.   BUN 02/20/2023 7  6 - 20 mg/dL Final   Creatinine, Ser 02/20/2023 0.65  0.44 - 1.00 mg/dL Final   Calcium 11/91/4782 8.7 (L)  8.9 - 10.3 mg/dL Final   GFR, Estimated 02/20/2023 >60  >60 mL/min Final   Comment: (NOTE) Calculated using the CKD-EPI Creatinine Equation (2021)    Anion gap 02/20/2023 8  5 - 15 Final   Performed at [facility], [address], [place], [place] [address]  Admission on 02/02/2023, Discharged on 02/04/2023  Component Date Value Ref Range Status   Sodium 02/02/2023 136  135 - 145 mmol/L Final   Potassium 02/02/2023 3.6  3.5 - 5.1 mmol/L Final   Chloride 02/02/2023 105  98 - 111 mmol/L Final   CO2 02/02/2023 23  22 - 32 mmol/L Final    Glucose, Bld 02/02/2023 120 (H)  70 - 99 mg/dL Final   Glucose reference range applies only to samples taken after fasting for at least 8 hours.   BUN 02/02/2023 6  6 - 20 mg/dL Final   Creatinine, Ser 02/02/2023 0.83  0.44 - 1.00 mg/dL Final   Calcium 30/86/5784 8.9  8.9 - 10.3 mg/dL Final   Total Protein 69/62/9528 7.3  6.5 - 8.1 g/dL Final   Albumin 41/32/4401 3.7  3.5 - 5.0 g/dL Final   AST 02/72/5366 17  15 - 41 U/L Final   ALT 02/02/2023 17  0 - 44 U/L Final   Alkaline Phosphatase 02/02/2023 84  38 - 126 U/L Final   Total Bilirubin 02/02/2023 0.4  <1.2 mg/dL Final   GFR, Estimated 02/02/2023 >60  >60 mL/min Final   Comment: (NOTE) Calculated using the CKD-EPI Creatinine Equation (2021)    Anion gap 02/02/2023 8  5 - 15 Final   Performed at [facility], [address], [place], [place] [address]   Alcohol,  Ethyl (B) 02/02/2023 <10  <10 mg/dL Final   Comment: (NOTE) Lowest detectable limit for serum alcohol is 10 mg/dL.  For medical purposes only. Performed at [facility], [address] [place], [place] [address]    Salicylate Lvl 02/02/2023 <7.0 (L)  7.0 - 30.0 mg/dL Final   Performed at [facility], [address] [place], [place] [address]   Acetaminophen (Tylenol), Serum 02/02/2023 <10 (L)  10 - 30 ug/mL Final   Comment: (NOTE) Therapeutic concentrations vary significantly. A range of 10-30 ug/mL  may be an effective concentration for many patients. However, some  are best treated at concentrations outside of this range. Acetaminophen concentrations >150 ug/mL at 4 hours after ingestion  and >50 ug/mL at 12 hours after ingestion are often associated with  toxic reactions.  Performed at [facility], [address], [place], [place] [address]    WBC 02/02/2023 9.6  4.0 - 10.5 K/uL Final   RBC 02/02/2023 4.56  3.87 - 5.11 MIL/uL Final   Hemoglobin 02/02/2023 11.7 (L)  12.0 - 15.0 g/dL  Final   HCT 91/47/8295 37.3  36.0 - 46.0 % Final   MCV 02/02/2023 81.8  80.0 - 100.0 fL Final   MCH 02/02/2023 25.7 (L)  26.0 - 34.0 pg Final   MCHC 02/02/2023 31.4  30.0 - 36.0 g/dL Final   RDW 62/13/0865 14.2  11.5 - 15.5 % Final   Platelets 02/02/2023 312  150 - 400 K/uL Final   nRBC 02/02/2023 0.0  0.0 - 0.2 % Final   Performed at [facility], [address] [place], [place] [address]   Opiates 02/02/2023 NONE DETECTED  NONE DETECTED Final   Cocaine 02/02/2023 NONE DETECTED  NONE DETECTED Final   Benzodiazepines 02/02/2023 NONE DETECTED  NONE DETECTED Final   Amphetamines 02/02/2023 NONE DETECTED  NONE DETECTED Final   Tetrahydrocannabinol 02/02/2023 NONE DETECTED  NONE DETECTED Final   Barbiturates 02/02/2023 NONE DETECTED  NONE DETECTED Final   Comment: (NOTE) DRUG SCREEN FOR MEDICAL PURPOSES ONLY.  IF CONFIRMATION IS NEEDED FOR ANY PURPOSE, NOTIFY LAB WITHIN 5 DAYS.  LOWEST DETECTABLE LIMITS FOR URINE DRUG SCREEN Drug Class                     Cutoff (ng/mL) Amphetamine and metabolites    1000 Barbiturate and metabolites    200 Benzodiazepine                 200 Opiates and metabolites        300 Cocaine and metabolites        300 THC                            50 Performed at [facility], [address], [place], [place] [address]    Preg, Serum 02/02/2023 NEGATIVE  NEGATIVE Final   Comment:        THE SENSITIVITY OF THIS METHODOLOGY IS >10 mIU/mL. Performed at [facility], [address] [place], [place] [address]   Admission on 01/22/2023, Discharged on 01/23/2023  Component Date Value Ref Range Status   Sodium 01/22/2023 140  135 - 145 mmol/L Final   Potassium 01/22/2023 3.6  3.5 - 5.1 mmol/L Final   Chloride 01/22/2023 105  98 - 111 mmol/L Final   CO2 01/22/2023 26  22 - 32 mmol/L Final   Glucose, Bld 01/22/2023  100 (H)  70 - 99 mg/dL Final   Glucose reference range applies only to samples taken  after fasting for at least 8 hours.   BUN 01/22/2023 7  6 - 20 mg/dL Final   Creatinine, Ser 01/22/2023 0.81  0.44 - 1.00 mg/dL Final   Calcium 16/11/9602 9.4  8.9 - 10.3 mg/dL Final   GFR, Estimated 01/22/2023 >60  >60 mL/min Final   Comment: (NOTE) Calculated using the CKD-EPI Creatinine Equation (2021)    Anion gap 01/22/2023 9  5 - 15 Final   Performed at [facility], [address] [place], [place] [address]   WBC 01/22/2023 10.5  4.0 - 10.5 K/uL Final   RBC 01/22/2023 4.84  3.87 - 5.11 MIL/uL Final   Hemoglobin 01/22/2023 12.3  12.0 - 15.0 g/dL Final   HCT 11/91/4782 40.0  36.0 - 46.0 % Final   MCV 01/22/2023 82.6  80.0 - 100.0 fL Final   MCH 01/22/2023 25.4 (L)  26.0 - 34.0 pg Final   MCHC 01/22/2023 30.8  30.0 - 36.0 g/dL Final   RDW 95/62/1308 13.7  11.5 - 15.5 % Final   Platelets 01/22/2023 356  150 - 400 K/uL Final   nRBC 01/22/2023 0.0  0.0 - 0.2 % Final   Performed at [facility], [address] [place], [place] [address]   Troponin I (High Sensitivity) 01/22/2023 4  <18 ng/L Final   Comment: (NOTE) Elevated high sensitivity troponin I (hsTnI) values and significant  changes across serial measurements may suggest ACS but many other  chronic and acute conditions are known to elevate hsTnI results.  Refer to the "Links" section for chest pain algorithms and additional  guidance. Performed at [facility], [address] [place], [place] [address]    Preg, Serum 01/22/2023 NEGATIVE  NEGATIVE Final   Comment:        THE SENSITIVITY OF THIS METHODOLOGY IS >10 mIU/mL. Performed at [facility], [address] [place], [place] [address]   Admission on 12/19/2022, Discharged on 01/15/2023  Component Date Value Ref Range Status   SARS Coronavirus 2 by RT PCR 01/13/2023 NEGATIVE  NEGATIVE Final   Comment: (NOTE) SARS-CoV-2 target nucleic acids are NOT DETECTED.  The SARS-CoV-2 RNA is generally detectable in upper and lower respiratory  specimens during the acute phase of infection. The lowest concentration of SARS-CoV-2 viral copies this assay can detect is 250 copies / mL. A negative result does not preclude SARS-CoV-2 infection and should not be used as the sole basis for treatment or other patient management decisions.  A negative result may occur with improper specimen collection / handling, submission of specimen other than nasopharyngeal swab, presence of viral mutation(s) within the areas targeted by this assay, and inadequate number of viral copies (<250 copies / mL). A negative result must be combined with clinical observations, patient history, and epidemiological information.  Fact Sheet for Patients:   RoadLapTop.co.za  Fact Sheet for Healthcare Providers: http://kim-miller.com/  This test is not yet approved or                           cleared by the [place] FDA and has been authorized for detection and/or diagnosis of SARS-CoV-2 by FDA under an Emergency Use Authorization (EUA).  This EUA will remain in effect (meaning this test can be used) for the duration of the COVID-19 declaration under Section 564(b)(1) of the  Act, 21 U.S.C. section 360bbb-3(b)(1), unless the authorization is terminated or revoked sooner.  Performed at [facility], [address] [place], [place] [address]   Admission on 12/18/2022, Discharged on 12/19/2022  Component Date Value Ref Range Status   Alcohol, Ethyl (B) 12/18/2022 <10  <10 mg/dL Final   Comment: (NOTE) Lowest detectable limit for serum alcohol is 10 mg/dL.  For medical purposes only. Performed at [facility], [address] [place], [place] [address]    Opiates 12/18/2022 NONE DETECTED  NONE DETECTED Final   Cocaine 12/18/2022 NONE DETECTED  NONE DETECTED Final   Benzodiazepines 12/18/2022 NONE DETECTED  NONE DETECTED Final   Amphetamines 12/18/2022 NONE DETECTED   NONE DETECTED Final   Tetrahydrocannabinol 12/18/2022 NONE DETECTED  NONE DETECTED Final   Barbiturates 12/18/2022 NONE DETECTED  NONE DETECTED Final   Comment: (NOTE) DRUG SCREEN FOR MEDICAL PURPOSES ONLY.  IF CONFIRMATION IS NEEDED FOR ANY PURPOSE, NOTIFY LAB WITHIN 5 DAYS.  LOWEST DETECTABLE LIMITS FOR URINE DRUG SCREEN Drug Class                     Cutoff (ng/mL) Amphetamine and metabolites    1000 Barbiturate and metabolites    200 Benzodiazepine                 200 Opiates and metabolites        300 Cocaine and metabolites        300 THC                            50 Performed at [facility], [address], [place], [place] [address]    WBC 12/19/2022 8.3  4.0 - 10.5 K/uL Final   RBC 12/19/2022 4.10  3.87 - 5.11 MIL/uL Final   Hemoglobin 12/19/2022 11.1 (L)  12.0 - 15.0 g/dL Final   HCT 95/62/1308 34.3 (L)  36.0 - 46.0 % Final   MCV 12/19/2022 83.7  80.0 - 100.0 fL Final   MCH 12/19/2022 27.1  26.0 - 34.0 pg Final   MCHC 12/19/2022 32.4  30.0 - 36.0 g/dL Final   RDW 65/78/4696 13.4  11.5 - 15.5 % Final   Platelets 12/19/2022 312  150 - 400 K/uL Final   nRBC 12/19/2022 0.0  0.0 - 0.2 % Final   Neutrophils Relative % 12/19/2022 61  % Final   Neutro Abs 12/19/2022 5.0  1.7 - 7.7 K/uL Final   Lymphocytes Relative 12/19/2022 30  % Final   Lymphs Abs 12/19/2022 2.5  0.7 - 4.0 K/uL Final   Monocytes Relative 12/19/2022 7  % Final   Monocytes Absolute 12/19/2022 0.6  0.1 - 1.0 K/uL Final   Eosinophils Relative 12/19/2022 1  % Final   Eosinophils Absolute 12/19/2022 0.1  0.0 - 0.5 K/uL Final   Basophils Relative 12/19/2022 1  % Final   Basophils Absolute 12/19/2022 0.0  0.0 - 0.1 K/uL Final   Immature Granulocytes 12/19/2022 0  % Final   Abs Immature Granulocytes 12/19/2022 0.03  0.00 - 0.07 K/uL Final   Performed at [facility], [address] [place], [place] [address]   Preg, Serum 12/19/2022 NEGATIVE  NEGATIVE Final   Comment:         THE SENSITIVITY OF THIS METHODOLOGY IS >10 mIU/mL. Performed at [facility], [address], [place], [place] [address]    Sodium 12/19/2022 137  135 - 145 mmol/L Final   Potassium 12/19/2022 3.6  3.5 - 5.1 mmol/L Final   Chloride 12/19/2022 106  98 - 111 mmol/L Final   CO2 12/19/2022 21 (L)  22 - 32 mmol/L Final   Glucose, Bld 12/19/2022 115 (H)  70 - 99 mg/dL Final   Glucose reference range applies only to samples taken after fasting for at least 8 hours.   BUN 12/19/2022 <5 (L)  6 - 20 mg/dL Final   Creatinine, Ser 12/19/2022 0.76  0.44 - 1.00 mg/dL Final   Calcium 16/11/9602 8.5 (L)  8.9 - 10.3 mg/dL Final   Total Protein 54/10/8117 6.6  6.5 - 8.1 g/dL Final   Albumin 14/78/2956 3.5  3.5 - 5.0 g/dL Final   AST 21/30/8657 26  15 - 41 U/L Final   ALT 12/19/2022 24  0 - 44 U/L Final   Alkaline Phosphatase 12/19/2022 81  38 - 126 U/L Final   Total Bilirubin 12/19/2022 0.3  <1.2 mg/dL Final   GFR, Estimated 12/19/2022 >60  >60 mL/min Final   Comment: (NOTE) Calculated using the CKD-EPI Creatinine Equation (2021)    Anion gap 12/19/2022 10  5 - 15 Final   Performed at [facility], [address], [place], [place] [address]  Admission on 12/17/2022, Discharged on 12/17/2022  Component Date Value Ref Range Status   Sodium 12/17/2022 139  135 - 145 mmol/L Final   Potassium 12/17/2022 3.6  3.5 - 5.1 mmol/L Final   Chloride 12/17/2022 105  98 - 111 mmol/L Final   CO2 12/17/2022 23  22 - 32 mmol/L Final   Glucose, Bld 12/17/2022 137 (H)  70 - 99 mg/dL Final   Glucose reference range applies only to samples taken after fasting for at least 8 hours.   BUN 12/17/2022 6  6 - 20 mg/dL Final   Creatinine, Ser 12/17/2022 0.83  0.44 - 1.00 mg/dL Final   Calcium 29/52/8413 9.0  8.9 - 10.3 mg/dL Final   Total Protein 24/40/1027 7.6  6.5 - 8.1 g/dL Final   Albumin 25/36/6440 4.1  3.5 - 5.0 g/dL Final   AST 34/74/2595 26  15 - 41 U/L Final   ALT  12/17/2022 24  0 - 44 U/L Final   Alkaline Phosphatase 12/17/2022 95  38 - 126 U/L Final   Total Bilirubin 12/17/2022 0.3  <1.2 mg/dL Final   GFR, Estimated 12/17/2022 >60  >60 mL/min Final   Comment: (NOTE) Calculated using the CKD-EPI Creatinine Equation (2021)    Anion gap 12/17/2022 11  5 - 15 Final   Performed at [facility], [address] [place], [place] [address]   Lipase 12/17/2022 40  11 - 51 U/L Final   Performed at [facility], [address], [place], [place] [address]   WBC 12/17/2022 7.4  4.0 - 10.5 K/uL Final   RBC 12/17/2022 4.29  3.87 - 5.11 MIL/uL Final   Hemoglobin 12/17/2022 11.2 (L)  12.0 - 15.0 g/dL Final   HCT 95/18/8416 36.4  36.0 - 46.0 % Final   MCV 12/17/2022 84.8  80.0 - 100.0 fL Final   MCH 12/17/2022 26.1  26.0 - 34.0 pg Final   MCHC 12/17/2022 30.8  30.0 - 36.0 g/dL Final   RDW 60/63/0160 13.7  11.5 - 15.5 % Final   Platelets 12/17/2022 354  150 - 400 K/uL Final   nRBC 12/17/2022 0.0  0.0 - 0.2 % Final   Neutrophils Relative %  12/17/2022 57  % Final   Neutro Abs 12/17/2022 4.2  1.7 - 7.7 K/uL Final   Lymphocytes Relative 12/17/2022 33  % Final   Lymphs Abs 12/17/2022 2.4  0.7 - 4.0 K/uL Final   Monocytes Relative 12/17/2022 8  % Final   Monocytes Absolute 12/17/2022 0.6  0.1 - 1.0 K/uL Final   Eosinophils Relative 12/17/2022 1  % Final   Eosinophils Absolute 12/17/2022 0.1  0.0 - 0.5 K/uL Final   Basophils Relative 12/17/2022 1  % Final   Basophils Absolute 12/17/2022 0.0  0.0 - 0.1 K/uL Final   Immature Granulocytes 12/17/2022 0  % Final   Abs Immature Granulocytes 12/17/2022 0.03  0.00 - 0.07 K/uL Final   Performed at [facility], [address], [place], [place] [address]   Color, Urine 12/17/2022 YELLOW  YELLOW Final   APPearance 12/17/2022 HAZY (A)  CLEAR Final   Specific Gravity, Urine 12/17/2022 1.016  1.005 - 1.030 Final   pH 12/17/2022 5.0  5.0 - 8.0 Final   Glucose, UA  12/17/2022 NEGATIVE  NEGATIVE mg/dL Final   Hgb urine dipstick 12/17/2022 NEGATIVE  NEGATIVE Final   Bilirubin Urine 12/17/2022 NEGATIVE  NEGATIVE Final   Ketones, ur 12/17/2022 NEGATIVE  NEGATIVE mg/dL Final   Protein, ur 19/14/7829 NEGATIVE  NEGATIVE mg/dL Final   Nitrite 56/21/3086 NEGATIVE  NEGATIVE Final   Leukocytes,Ua 12/17/2022 TRACE (A)  NEGATIVE Final   RBC / HPF 12/17/2022 0-5  0 - 5 RBC/hpf Final   WBC, UA 12/17/2022 0-5  0 - 5 WBC/hpf Final   Bacteria, UA 12/17/2022 NONE SEEN  NONE SEEN Final   Squamous Epithelial / HPF 12/17/2022 6-10  0 - 5 /HPF Final   Mucus 12/17/2022 PRESENT   Final   Performed at [facility], [address], [place], [place] [address]   Preg, Serum 12/17/2022 NEGATIVE  NEGATIVE Final   Comment:        THE SENSITIVITY OF THIS METHODOLOGY IS >10 mIU/mL. Performed at [facility], [address] [place], [place] [address]   Admission on 12/14/2022, Discharged on 12/16/2022  Component Date Value Ref Range Status   WBC 12/14/2022 8.6  4.0 - 10.5 K/uL Final   RBC 12/14/2022 4.58  3.87 - 5.11 MIL/uL Final   Hemoglobin 12/14/2022 11.9 (L)  12.0 - 15.0 g/dL Final   HCT 28/41/3244 37.7  36.0 - 46.0 % Final   MCV 12/14/2022 82.3  80.0 - 100.0 fL Final   MCH 12/14/2022 26.0  26.0 - 34.0 pg Final   MCHC 12/14/2022 31.6  30.0 - 36.0 g/dL Final   RDW 02/07/7251 13.7  11.5 - 15.5 % Final   Platelets 12/14/2022 367  150 - 400 K/uL Final   nRBC 12/14/2022 0.0  0.0 - 0.2 % Final   Neutrophils Relative % 12/14/2022 66  % Final   Neutro Abs 12/14/2022 5.6  1.7 - 7.7 K/uL Final   Lymphocytes Relative 12/14/2022 26  % Final   Lymphs Abs 12/14/2022 2.2  0.7 - 4.0 K/uL Final   Monocytes Relative 12/14/2022 6  % Final   Monocytes Absolute 12/14/2022 0.6  0.1 - 1.0 K/uL Final   Eosinophils Relative 12/14/2022 1  % Final   Eosinophils Absolute 12/14/2022 0.1  0.0 - 0.5 K/uL Final   Basophils Relative 12/14/2022 1  % Final    Basophils Absolute 12/14/2022 0.0  0.0 - 0.1 K/uL Final   Immature Granulocytes 12/14/2022 0  % Final  Abs Immature Granulocytes 12/14/2022 0.03  0.00 - 0.07 K/uL Final   Performed at [facility], [address], [place], [place] [address]   Sodium 12/14/2022 140  135 - 145 mmol/L Final   Potassium 12/14/2022 3.9  3.5 - 5.1 mmol/L Final   Chloride 12/14/2022 106  98 - 111 mmol/L Final   CO2 12/14/2022 24  22 - 32 mmol/L Final   Glucose, Bld 12/14/2022 82  70 - 99 mg/dL Final   Glucose reference range applies only to samples taken after fasting for at least 8 hours.   BUN 12/14/2022 <5 (L)  6 - 20 mg/dL Final   Creatinine, Ser 12/14/2022 0.78  0.44 - 1.00 mg/dL Final   Calcium 72/53/6644 9.2  8.9 - 10.3 mg/dL Final   Total Protein 03/47/4259 6.6  6.5 - 8.1 g/dL Final   Albumin 56/38/7564 3.7  3.5 - 5.0 g/dL Final   AST 33/29/5188 19  15 - 41 U/L Final   ALT 12/14/2022 19  0 - 44 U/L Final   Alkaline Phosphatase 12/14/2022 89  38 - 126 U/L Final   Total Bilirubin 12/14/2022 0.4  <1.2 mg/dL Final   GFR, Estimated 12/14/2022 >60  >60 mL/min Final   Comment: (NOTE) Calculated using the CKD-EPI Creatinine Equation (2021)    Anion gap 12/14/2022 10  5 - 15 Final   Performed at [facility], [address] [place], [place] [address]   Alcohol, Ethyl (B) 12/14/2022 <10  <10 mg/dL Final   Comment: (NOTE) Lowest detectable limit for serum alcohol is 10 mg/dL.  For medical purposes only. Performed at [facility], [address] [place], [place] [address]    Prolactin 12/14/2022 20.0  4.8 - 33.4 ng/mL Final   Comment: (NOTE) Performed At: [facility] [address] [place], [place] [address] Jolene Schimke MD DD:[phone]    Preg Test, Ur 12/14/2022 Negative  Negative Final   POC Amphetamine UR 12/14/2022 None Detected  NONE DETECTED (Cut Off Level 1000 ng/mL) Final   POC Secobarbital (BAR) 12/14/2022 None Detected  NONE DETECTED (Cut Off Level 300 ng/mL)  Final   POC Buprenorphine (BUP) 12/14/2022 None Detected  NONE DETECTED (Cut Off Level 10 ng/mL) Final   POC Oxazepam (BZO) 12/14/2022 None Detected  NONE DETECTED (Cut Off Level 300 ng/mL) Final   POC Cocaine UR 12/14/2022 None Detected  NONE DETECTED (Cut Off Level 300 ng/mL) Final   POC Methamphetamine UR 12/14/2022 None Detected  NONE DETECTED (Cut Off Level 1000 ng/mL) Final   POC Morphine 12/14/2022 None Detected  NONE DETECTED (Cut Off Level 300 ng/mL) Final   POC Methadone UR 12/14/2022 None Detected  NONE DETECTED (Cut Off Level 300 ng/mL) Final   POC Oxycodone UR 12/14/2022 None Detected  NONE DETECTED (Cut Off Level 100 ng/mL) Final   POC Marijuana UR 12/14/2022 None Detected  NONE DETECTED (Cut Off Level 50 ng/mL) Final   Cholesterol 12/14/2022 188  0 - 200 mg/dL Final   Triglycerides 23/76/2831 222 (H)  <150 mg/dL Final   HDL 51/76/1607 41  >40 mg/dL Final   Total CHOL/HDL Ratio 12/14/2022 4.6  RATIO Final   VLDL 12/14/2022 44 (H)  0 - 40 mg/dL Final   LDL Cholesterol 12/14/2022 103 (H)  0 - 99 mg/dL Final   Comment:        Total Cholesterol/HDL:CHD Risk Coronary Heart Disease Risk Table  Men   Women  1/2 Average Risk   3.4   3.3  Average Risk       5.0   4.4  2 X Average Risk   9.6   7.1  3 X Average Risk  23.4   11.0        Use the calculated Patient Ratio above and the CHD Risk Table to determine the patient's CHD Risk.        ATP III CLASSIFICATION (LDL):  <100     mg/dL   Optimal  161-096  mg/dL   Near or Above                    Optimal  130-159  mg/dL   Borderline  045-409  mg/dL   High  >811     mg/dL   Very High Performed at [facility], [address], [place], [place] [address]    TSH 12/14/2022 2.483  0.350 - 4.500 uIU/mL Final   Comment: Performed by a 3rd Generation assay with a functional sensitivity of <=0.01 uIU/mL. Performed at [facility], [address], [place], [place] [address]   Admission on 12/05/2022,  Discharged on 12/06/2022  Component Date Value Ref Range Status   Glucose-Capillary 12/05/2022 107 (H)  70 - 99 mg/dL Final   Glucose reference range applies only to samples taken after fasting for at least 8 hours.   Sodium 12/05/2022 137  135 - 145 mmol/L Final   Potassium 12/05/2022 3.7  3.5 - 5.1 mmol/L Final   Chloride 12/05/2022 108  98 - 111 mmol/L Final   CO2 12/05/2022 18 (L)  22 - 32 mmol/L Final   Glucose, Bld 12/05/2022 114 (H)  70 - 99 mg/dL Final   Glucose reference range applies only to samples taken after fasting for at least 8 hours.   BUN 12/05/2022 7  6 - 20 mg/dL Final   Creatinine, Ser 12/05/2022 0.79  0.44 - 1.00 mg/dL Final   Calcium 13/09/6576 8.7 (L)  8.9 - 10.3 mg/dL Final   Total Protein 46/96/2952 6.5  6.5 - 8.1 g/dL Final   Albumin 84/13/2440 3.3 (L)  3.5 - 5.0 g/dL Final   AST 12/02/2534 15  15 - 41 U/L Final   ALT 12/05/2022 14  0 - 44 U/L Final   Alkaline Phosphatase 12/05/2022 65  38 - 126 U/L Final   Total Bilirubin 12/05/2022 0.4  0.3 - 1.2 mg/dL Final   GFR, Estimated 12/05/2022 >60  >60 mL/min Final   Comment: (NOTE) Calculated using the CKD-EPI Creatinine Equation (2021)    Anion gap 12/05/2022 11  5 - 15 Final   Performed at [facility], [address] [place], [place] [address]   Salicylate Lvl 12/05/2022 <7.0 (L)  7.0 - 30.0 mg/dL Final   Performed at [facility], [address], [place], [place] [address]   Acetaminophen (Tylenol), Serum 12/05/2022 <10 (L)  10 - 30 ug/mL Final   Comment: (NOTE) Therapeutic concentrations vary significantly. A range of 10-30 ug/mL  may be an effective concentration for many patients. However, some  are best treated at concentrations outside of this range. Acetaminophen concentrations >150 ug/mL at 4 hours after ingestion  and >50 ug/mL at 12 hours after ingestion are often associated with  toxic reactions.  Performed at [facility], [address], [place], [place] [address]     Alcohol, Ethyl (B) 12/05/2022 <10  <10  mg/dL Final   Comment: (NOTE) Lowest detectable limit for serum alcohol is 10 mg/dL.  For medical purposes only. Performed at [facility], [address] [place], [place] [address]    Opiates 12/05/2022 NONE DETECTED  NONE DETECTED Final   Cocaine 12/05/2022 NONE DETECTED  NONE DETECTED Final   Benzodiazepines 12/05/2022 NONE DETECTED  NONE DETECTED Final   Amphetamines 12/05/2022 NONE DETECTED  NONE DETECTED Final   Tetrahydrocannabinol 12/05/2022 NONE DETECTED  NONE DETECTED Final   Barbiturates 12/05/2022 NONE DETECTED  NONE DETECTED Final   Comment: (NOTE) DRUG SCREEN FOR MEDICAL PURPOSES ONLY.  IF CONFIRMATION IS NEEDED FOR ANY PURPOSE, NOTIFY LAB WITHIN 5 DAYS.  LOWEST DETECTABLE LIMITS FOR URINE DRUG SCREEN Drug Class                     Cutoff (ng/mL) Amphetamine and metabolites    1000 Barbiturate and metabolites    200 Benzodiazepine                 200 Opiates and metabolites        300 Cocaine and metabolites        300 THC                            50 Performed at [facility], [address], [place], [place] [address]    WBC 12/05/2022 6.8  4.0 - 10.5 K/uL Final   RBC 12/05/2022 4.28  3.87 - 5.11 MIL/uL Final   Hemoglobin 12/05/2022 10.8 (L)  12.0 - 15.0 g/dL Final   HCT 84/69/6295 35.0 (L)  36.0 - 46.0 % Final   MCV 12/05/2022 81.8  80.0 - 100.0 fL Final   MCH 12/05/2022 25.2 (L)  26.0 - 34.0 pg Final   MCHC 12/05/2022 30.9  30.0 - 36.0 g/dL Final   RDW 28/41/3244 14.0  11.5 - 15.5 % Final   Platelets 12/05/2022 258  150 - 400 K/uL Final   nRBC 12/05/2022 0.0  0.0 - 0.2 % Final   Neutrophils Relative % 12/05/2022 64  % Final   Neutro Abs 12/05/2022 4.3  1.7 - 7.7 K/uL Final   Lymphocytes Relative 12/05/2022 28  % Final   Lymphs Abs 12/05/2022 1.9  0.7 - 4.0 K/uL Final   Monocytes Relative 12/05/2022 7  % Final   Monocytes Absolute 12/05/2022 0.5  0.1 - 1.0 K/uL Final   Eosinophils Relative 12/05/2022  1  % Final   Eosinophils Absolute 12/05/2022 0.1  0.0 - 0.5 K/uL Final   Basophils Relative 12/05/2022 0  % Final   Basophils Absolute 12/05/2022 0.0  0.0 - 0.1 K/uL Final   Immature Granulocytes 12/05/2022 0  % Final   Abs Immature Granulocytes 12/05/2022 0.03  0.00 - 0.07 K/uL Final   Performed at [facility], [address] [place], [place] [address]   Preg, Serum 12/05/2022 NEGATIVE  NEGATIVE Final   Comment:        THE SENSITIVITY OF THIS METHODOLOGY IS >10 mIU/mL. Performed at [facility], [address], [place], [place] [address]    Acetaminophen (Tylenol), Serum 12/05/2022 <10 (L)  10 - 30 ug/mL Final   Comment: (NOTE) Therapeutic concentrations vary significantly. A range of 10-30 ug/mL  may be an effective concentration for many patients. However, some  are best treated at concentrations outside of this range. Acetaminophen concentrations >150 ug/mL at 4 hours after  ingestion  and >50 ug/mL at 12 hours after ingestion are often associated with  toxic reactions.  Performed at [facility], [address], [place], [place] [address]   Admission on 11/30/2022, Discharged on 12/01/2022  Component Date Value Ref Range Status   WBC 12/01/2022 9.0  4.0 - 10.5 K/uL Final   RBC 12/01/2022 4.16  3.87 - 5.11 MIL/uL Final   Hemoglobin 12/01/2022 10.7 (L)  12.0 - 15.0 g/dL Final   HCT 60/45/4098 34.3 (L)  36.0 - 46.0 % Final   MCV 12/01/2022 82.5  80.0 - 100.0 fL Final   MCH 12/01/2022 25.7 (L)  26.0 - 34.0 pg Final   MCHC 12/01/2022 31.2  30.0 - 36.0 g/dL Final   RDW 11/91/4782 14.2  11.5 - 15.5 % Final   Platelets 12/01/2022 263  150 - 400 K/uL Final   nRBC 12/01/2022 0.0  0.0 - 0.2 % Final   Neutrophils Relative % 12/01/2022 63  % Final   Neutro Abs 12/01/2022 5.6  1.7 - 7.7 K/uL Final   Lymphocytes Relative 12/01/2022 29  % Final   Lymphs Abs 12/01/2022 2.6  0.7 - 4.0 K/uL Final   Monocytes Relative 12/01/2022 7  % Final   Monocytes Absolute 12/01/2022  0.7  0.1 - 1.0 K/uL Final   Eosinophils Relative 12/01/2022 1  % Final   Eosinophils Absolute 12/01/2022 0.1  0.0 - 0.5 K/uL Final   Basophils Relative 12/01/2022 0  % Final   Basophils Absolute 12/01/2022 0.0  0.0 - 0.1 K/uL Final   Immature Granulocytes 12/01/2022 0  % Final   Abs Immature Granulocytes 12/01/2022 0.04  0.00 - 0.07 K/uL Final   Performed at [facility], [address], [place], [place] [address]   Sodium 12/01/2022 137  135 - 145 mmol/L Final   Potassium 12/01/2022 3.6  3.5 - 5.1 mmol/L Final   Chloride 12/01/2022 104  98 - 111 mmol/L Final   CO2 12/01/2022 21 (L)  22 - 32 mmol/L Final   Glucose, Bld 12/01/2022 122 (H)  70 - 99 mg/dL Final   Glucose reference range applies only to samples taken after fasting for at least 8 hours.   BUN 12/01/2022 8  6 - 20 mg/dL Final   Creatinine, Ser 12/01/2022 0.75  0.44 - 1.00 mg/dL Final   Calcium 30/86/5784 8.7 (L)  8.9 - 10.3 mg/dL Final   Total Protein 69/62/9528 6.4 (L)  6.5 - 8.1 g/dL Final   Albumin 41/32/4401 3.3 (L)  3.5 - 5.0 g/dL Final   AST 02/72/5366 18  15 - 41 U/L Final   ALT 12/01/2022 16  0 - 44 U/L Final   Alkaline Phosphatase 12/01/2022 70  38 - 126 U/L Final   Total Bilirubin 12/01/2022 0.2 (L)  0.3 - 1.2 mg/dL Final   GFR, Estimated 12/01/2022 >60  >60 mL/min Final   Comment: (NOTE) Calculated using the CKD-EPI Creatinine Equation (2021)    Anion gap 12/01/2022 12  5 - 15 Final   Performed at [facility], [address], [place], [place] [address]   Lipase 12/01/2022 27  11 - 51 U/L Final   Performed at [facility], [address], [place], [place] [address]   Preg, Serum 12/01/2022 NEGATIVE  NEGATIVE Final   Comment:        THE SENSITIVITY OF THIS METHODOLOGY IS >10 mIU/mL. Performed at [facility], [address], [place], [place] [address]  Color, Urine 12/01/2022 YELLOW  YELLOW Final   APPearance 12/01/2022 CLEAR  CLEAR Final   Specific Gravity, Urine 12/01/2022 1.013   1.005 - 1.030 Final   pH 12/01/2022 7.0  5.0 - 8.0 Final   Glucose, UA 12/01/2022 NEGATIVE  NEGATIVE mg/dL Final   Hgb urine dipstick 12/01/2022 NEGATIVE  NEGATIVE Final   Bilirubin Urine 12/01/2022 NEGATIVE  NEGATIVE Final   Ketones, ur 12/01/2022 NEGATIVE  NEGATIVE mg/dL Final   Protein, ur 82/95/6213 NEGATIVE  NEGATIVE mg/dL Final   Nitrite 08/65/7846 NEGATIVE  NEGATIVE Final   Leukocytes,Ua 12/01/2022 NEGATIVE  NEGATIVE Final   Performed at [facility], [address] [place], [place] [address]   Sodium 12/01/2022 140  135 - 145 mmol/L Final   Potassium 12/01/2022 4.1  3.5 - 5.1 mmol/L Final   Chloride 12/01/2022 104  98 - 111 mmol/L Final   BUN 12/01/2022 8  6 - 20 mg/dL Final   Creatinine, Ser 12/01/2022 0.70  0.44 - 1.00 mg/dL Final   Glucose, Bld 28/41/3244 122 (H)  70 - 99 mg/dL Final   Glucose reference range applies only to samples taken after fasting for at least 8 hours.   Calcium, Ion 12/01/2022 1.13 (L)  1.15 - 1.40 mmol/L Final   TCO2 12/01/2022 25  22 - 32 mmol/L Final   Hemoglobin 12/01/2022 10.9 (L)  12.0 - 15.0 g/dL Final   HCT 02/07/7251 32.0 (L)  36.0 - 46.0 % Final  Admission on 10/19/2022, Discharged on 10/31/2022  Component Date Value Ref Range Status   Preg Test, Ur 10/21/2022 NEGATIVE  NEGATIVE Final   Comment:        THE SENSITIVITY OF THIS METHODOLOGY IS >25 mIU/mL. Performed at [facility], [address] [place], [place] [address]    TSH 10/22/2022 2.850  0.350 - 4.500 uIU/mL Final   Comment: Performed by a 3rd Generation assay with a functional sensitivity of <=0.01 uIU/mL. Performed at [facility], [address], [place], [place] [address]    Cholesterol 10/22/2022 192  0 - 200 mg/dL Final   Triglycerides 59/56/3875 143  <150 mg/dL Final   HDL 64/33/2951 46  >40 mg/dL Final   Total CHOL/HDL Ratio 10/22/2022 4.2  RATIO Final   VLDL 10/22/2022 29  0 - 40 mg/dL Final   LDL Cholesterol 10/22/2022 117  (H)  0 - 99 mg/dL Final   Comment:        Total Cholesterol/HDL:CHD Risk Coronary Heart Disease Risk Table                     Men   Women  1/2 Average Risk   3.4   3.3  Average Risk       5.0   4.4  2 X Average Risk   9.6   7.1  3 X Average Risk  23.4   11.0        Use the calculated Patient Ratio above and the CHD Risk Table to determine the patient's CHD Risk.        ATP III CLASSIFICATION (LDL):  <100     mg/dL   Optimal  884-166  mg/dL   Near or Above                    Optimal  130-159  mg/dL   Borderline  063-016  mg/dL   High  >010     mg/dL   Very High Performed at  [facility], [address], [place], [place] [address]    Hgb A1c MFr Bld 10/22/2022 5.4  4.8 - 5.6 % Final   Comment: (NOTE)         Prediabetes: 5.7 - 6.4         Diabetes: >6.4         Glycemic control for adults with diabetes: <7.0    Mean Plasma Glucose 10/22/2022 108  mg/dL Final   Comment: (NOTE) Performed At: [facility] [address] [place], [place] [id number] Jolene Schimke MD AO:[id number]    Vit D, 25-Hydroxy 10/22/2022 31.39  30 - 100 ng/mL Final   Comment: (NOTE) Vitamin D deficiency has been defined by the Institute of Medicine  and an Endocrine Society practice guideline as a level of serum 25-OH  vitamin D less than 20 ng/mL (1,2). The Endocrine Society went on to  further define vitamin D insufficiency as a level between 21 and 29  ng/mL (2).  1. IOM (Institute of Medicine). 2010. Dietary reference intakes for  calcium and D. [place]: [employer]. 2. Holick MF, Binkley Hickory, Bischoff-Ferrari HA, et al. Evaluation,  treatment, and prevention of vitamin D deficiency: an Endocrine  Society clinical practice guideline, JCEM. 2011 Jul; 96(7): 1911-30.  Performed at [facility], [address], [place], [place] [address]   There may be more visits with results that are not included.    Allergies: Bee venom, Coconut  flavoring agent (non-screening), Fish allergy, Geodon [ziprasidone hcl], Haloperidol and related, Lithobid [lithium], Roxicodone [oxycodone], Seroquel [quetiapine], Shellfish allergy, Phenergan [promethazine hcl], Prilosec [omeprazole], Sulfa antibiotics, Tegretol [carbamazepine], Prozac [fluoxetine], Tape, and Tylenol [acetaminophen]  Medications:  PTA Medications  Medication Sig   ondansetron (ZOFRAN-ODT) 4 MG disintegrating tablet Take 1 tablet (4 mg total) by mouth every 8 (eight) hours as needed for nausea or vomiting.   albuterol (PROVENTIL) (2.5 MG/3ML) 0.083% nebulizer solution Take 3 mLs (2.5 mg total) by nebulization every 6 (six) hours as needed for wheezing or shortness of breath.   diclofenac Sodium (VOLTAREN) 1 % GEL Apply 4 g topically 4 (four) times daily as needed (pain).   hydrOXYzine (ATARAX) 25 MG tablet Take 1 tablet (25 mg total) by mouth 3 (three) times daily as needed for anxiety.   melatonin 3 MG TABS tablet Take 1 tablet (3 mg total) by mouth at bedtime. (Patient taking differently: Take 3 mg by mouth at bedtime as needed (As needed for sleep).)   pantoprazole (PROTONIX) 40 MG tablet Take 1 tablet (40 mg total) by mouth daily before breakfast.   metFORMIN (GLUCOPHAGE) 500 MG tablet Take 500 mg by mouth daily.   gabapentin (NEURONTIN) 600 MG tablet Take 600 mg by mouth 3 (three) times daily.   SYMBICORT 160-4.5 MCG/ACT inhaler Inhale 2 puffs into the lungs in the morning and at bedtime.   cyclobenzaprine (FLEXERIL) 10 MG tablet Take 10 mg by mouth at bedtime.   risperiDONE (RISPERDAL) 2 MG tablet Take 2 mg by mouth 2 (two) times daily.   prazosin (MINIPRESS) 2 MG capsule Take 2 mg by mouth at bedtime.   DULoxetine (CYMBALTA) 30 MG capsule Take 3 capsules (90 mg total) by mouth at bedtime.   LORazepam (ATIVAN) 0.5 MG tablet Take 1 tablet (0.5 mg total) by mouth 4 (four) times daily as needed for anxiety.   OLANZapine (ZYPREXA) 5 MG tablet Take 1 tablet (5 mg total) by mouth  3 (three) times daily as needed (significant agitation  w/ threat of harm to self or others. Ordered TID not q8 OK to give early. Will adjust dosing tomorrow.).   OLANZapine (ZYPREXA) injection Inject 1 mL (5 mg total) into the muscle 2 (two) times daily as needed for agitation (Significant agitation with threat of harm to self or others AND refusal of PO option. Do not give within 30 min of IV or IM benzodiazepine.).   benzonatate (TESSALON) 100 MG capsule Take 1 capsule (100 mg total) by mouth 3 (three) times daily as needed for cough.   guaiFENesin (MUCINEX) 600 MG 12 hr tablet Take 1 tablet (600 mg total) by mouth 2 (two) times daily.      Medical Decision Making  Observation unit    Recommendations  Based on my evaluation the patient does not appear to have an emergency medical condition.  Sindy Guadeloupe, NP 04/08/23  9:42 PM
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
## 2023-04-08 NOTE — ED Notes (Signed)
 Patient is currently lying in bed, patient appears to be in no acute distress, respirations are even and unlabored, will continue to monitor patient for safety.
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
## 2023-04-08 NOTE — ED Notes (Signed)
 Staff was unsuccessful at obtaining patients blood during admission work up. Will attempt again at a later time.

## 2023-04-09 DIAGNOSIS — F3289 Other specified depressive episodes: Secondary | ICD-10-CM | POA: Diagnosis not present

## 2023-04-09 DIAGNOSIS — F250 Schizoaffective disorder, bipolar type: Secondary | ICD-10-CM

## 2023-04-09 MED ORDER — RISPERIDONE 2 MG PO TABS: 2.0000 mg | ORAL_TABLET | Freq: Two times a day (BID) | ORAL | Status: DC

## 2023-04-09 MED ORDER — CYCLOBENZAPRINE HCL 10 MG PO TABS: 10.0000 mg | ORAL_TABLET | Freq: Every day | ORAL | Status: DC

## 2023-04-09 MED ORDER — DULOXETINE HCL 30 MG PO CPEP: 90.0000 mg | ORAL_CAPSULE | Freq: Every day | ORAL | Status: DC

## 2023-04-09 MED ORDER — BENZONATATE 100 MG PO CAPS: 100.0000 mg | ORAL_CAPSULE | Freq: Three times a day (TID) | ORAL | Status: DC | PRN

## 2023-04-09 MED ORDER — GABAPENTIN 300 MG PO CAPS: 600.0000 mg | ORAL_CAPSULE | Freq: Three times a day (TID) | ORAL | Status: DC

## 2023-04-09 MED ORDER — GUAIFENESIN ER 600 MG PO TB12: 600.0000 mg | ORAL_TABLET | Freq: Two times a day (BID) | ORAL | Status: DC

## 2023-04-09 MED ORDER — DULOXETINE HCL 60 MG PO CPEP: 90.0000 mg | ORAL_CAPSULE | Freq: Every day | ORAL | Status: DC

## 2023-04-09 MED ORDER — ALBUTEROL SULFATE (2.5 MG/3ML) 0.083% IN NEBU: 2.5000 mg | INHALATION_SOLUTION | Freq: Four times a day (QID) | RESPIRATORY_TRACT | 0 refills | Status: DC | PRN

## 2023-04-09 MED ORDER — GUAIFENESIN ER 600 MG PO TB12
600.0000 mg | ORAL_TABLET | Freq: Two times a day (BID) | ORAL | Status: DC
  Filled 2023-04-09: qty 1

## 2023-04-09 MED ORDER — METFORMIN HCL 500 MG PO TABS
500.0000 mg | ORAL_TABLET | Freq: Every day | ORAL | Status: DC
  Administered 2023-04-09: 500 mg via ORAL
  Filled 2023-04-09: qty 1

## 2023-04-09 MED ORDER — GABAPENTIN 300 MG PO CAPS
600.0000 mg | ORAL_CAPSULE | Freq: Three times a day (TID) | ORAL | Status: DC
  Administered 2023-04-09: 600 mg via ORAL
  Filled 2023-04-09: qty 2

## 2023-04-09 MED ORDER — PRAZOSIN HCL 2 MG PO CAPS: 2.0000 mg | ORAL_CAPSULE | Freq: Every day | ORAL | Status: DC

## 2023-04-09 MED ORDER — OLANZAPINE 5 MG PO TABS: 5.0000 mg | ORAL_TABLET | Freq: Three times a day (TID) | ORAL | Status: DC | PRN

## 2023-04-09 MED ORDER — RISPERIDONE 2 MG PO TABS
2.0000 mg | ORAL_TABLET | Freq: Two times a day (BID) | ORAL | Status: DC
  Filled 2023-04-09: qty 1

## 2023-04-09 MED ORDER — HYDROXYZINE HCL 25 MG PO TABS
25.0000 mg | ORAL_TABLET | Freq: Three times a day (TID) | ORAL | 0 refills | Status: DC | PRN
Start: 2023-04-09 — End: 2023-04-26

## 2023-04-09 MED ORDER — ALBUTEROL SULFATE HFA 108 (90 BASE) MCG/ACT IN AERS: 2.0000 | INHALATION_SPRAY | Freq: Four times a day (QID) | RESPIRATORY_TRACT | Status: DC | PRN

## 2023-04-09 MED ORDER — CYCLOBENZAPRINE HCL 10 MG PO TABS
10.0000 mg | ORAL_TABLET | Freq: Every day | ORAL | Status: DC
Start: 2023-04-09 — End: 2023-04-09

## 2023-04-09 MED ORDER — ALBUTEROL SULFATE (2.5 MG/3ML) 0.083% IN NEBU: 2.5000 mg | INHALATION_SOLUTION | Freq: Four times a day (QID) | RESPIRATORY_TRACT | Status: DC | PRN

## 2023-04-09 MED ORDER — UZEDY 125 MG/0.35ML ~~LOC~~ SUSY: 125.0000 mg | PREFILLED_SYRINGE | SUBCUTANEOUS | Status: DC

## 2023-04-09 MED ORDER — OLANZAPINE 5 MG PO TABS
5.0000 mg | ORAL_TABLET | Freq: Three times a day (TID) | ORAL | Status: DC | PRN
Start: 2023-04-09 — End: 2023-04-26

## 2023-04-09 MED ORDER — HYDROXYZINE HCL 25 MG PO TABS: 25.0000 mg | ORAL_TABLET | Freq: Three times a day (TID) | ORAL | Status: DC | PRN

## 2023-04-09 MED ORDER — METFORMIN HCL 500 MG PO TABS: 500.0000 mg | ORAL_TABLET | Freq: Every day | ORAL | Status: DC

## 2023-04-09 NOTE — Progress Notes (Signed)
 Per v/o from [place] NP pt will be discharged home via taxi voucher.  [employer] called for transport to [address]. Pt agrees with this plan.

## 2023-04-09 NOTE — ED Notes (Signed)
 Pt alert oriented and cooperative.  AVS given to pt along with belongings and follow up instructions.  Pt escorted to lobby and left via taxi with voucher.   No distress noted.

## 2023-04-09 NOTE — ED Notes (Signed)
Pt resting quietly. Breathing even and unlabored.  

## 2023-04-09 NOTE — Discharge Instructions (Signed)

## 2023-04-09 NOTE — ED Notes (Signed)
 Pt laying quietly in bed.  She stated that " I wish that  Meta Hatchet NP) would get my discharge stuff ready.  I am ready to go" .  No distress noted.  Awaiting dispo

## 2023-04-09 NOTE — ED Provider Notes (Signed)
 FBC/OBS ASAP Discharge Summary  Date and Time: 04/09/2023 2:57 PM  Name: [name]  MRN:  [id number]   Discharge Diagnoses:  Final diagnoses:  Auditory hallucination  Schizoaffective disorder, bipolar type (HCC)  Other depression   HPI: [name], 33 y/o female with a history of schizoaffective disorder, bipolar disorder, suicidal ideation, intentional overdose, depression.  Presented to [facility] via [facility] on 04/08/2023.  Per the patient she has been having auditory hallucination for the past 2 weeks but denied it is getting worse according to the patient she hears voices telling her she is going to go to prison or she is going to be put in a group home.  Patient does have an ACT team and with [facility], per the patient she reached out to them but they did not do anything.  Patient does not appear to be in any distress at this time.  Reports she is currently compliant with all her medications.    Patient was admitted to the continuous assessment unit for overnight observation  Patient seen face-to-face by this provider, chart reviewed, and case consulted with Dr. Woodroe Mode 04/09/2023.   Per chart review patient has a past psychiatric history of schizoaffective disorder bipolar type, GAD, PTSD, and BPD.  She has a history of multiple suicide attempts, a history of self injures behaviors (cutting) and multiple psychiatric hospitalizations.  She has a medical history that includes migraines, intra ventricular conduction delay, paroxysmal atrial fibrillation, asthma, GERD and anemia.  She has services in place with [facility] ACT team.  Current outpatient psychotropic medications include Risperdal 2 mg BID, Prazosin 2 mg at bedtime, gabapentin 600 mg TID, Cymbalta 90 mg at bedtime, melatonin 3 mg at bedtime.  Patient well-known to the service line.  She was recently discharged from [facility].  She was recently  Subjective: Currently on assessment  patient is observed laying in her bed awake.  She immediately request to be discharged home.  She was able to sleep while on the unit.  She reports feeling better today and states her voices are faint..  She is denying any depression and states, "I am not depressed my medicines are working".  She has a euthymic affect.  She denies any concerns with appetite or sleep.  She denies suicidal ideations.  She has no intent, plan, or access to means.  She denies access to firearms/weapons.  She verbally contracts for safety.  She denies homicidal ideations.  She denies visual hallucinations.  She endorses auditory hallucinations which she states are chronic.  She often has hears the voice of her mother that tells her that she is going to a group home or to prison.  She is unsure why yesterday her voices felt more intense.  She was anxious when her voices intensified.  She does not appear to be responding to internal/external stimuli.  She does not appear manic, psychotic, delusional, or paranoid.  Stay Summary:   Patient is requesting to be discharged home.  On admission she never endorsed any suicidal or homicidal ideations.  Her auditory hallucinations are chronic in nature.  They are not command hallucinations.  Discussed inpatient psychiatric admission and she is not interested.  States, "I do not need to be in the hospital right now".  She does not meet criteria for involuntary commitment.  She has an appointment this week with her ACTT psychiatrist.  She looks forward to going back to work.  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  Patient will be discharged home.  She does not have transportation and states she does not had to use the bus system.  She will be provided with a taxi voucher home.  Patient has chronic suicidal ideations.  She has a good support system with ACTT team who checks in often throughout the week.  Her father is supported and checks in on her throughout the week.  In  addition she lives with her female friend whom she states is her biggest support system.  Reports medications are only dispensed to her a week at a time.  She verbally contracts for safety and agrees not to harm herself.  She is aware to call [phone] or present to [facility] or closest emergency room if her condition were to worsen.  Total Time spent with patient: 30 minutes  Past Psychiatric History: see H&P Past Medical History: see H&P Family History: see H&P Family Psychiatric History: see H&P Social History: se H&P Tobacco Cessation:  N/A, patient does not currently use tobacco products  Current Medications:  Current Facility-Administered Medications  Medication Dose Route Frequency Provider Last Rate Last Admin   albuterol (PROVENTIL) (2.5 MG/3ML) 0.083% nebulizer solution 2.5 mg  2.5 mg Nebulization Q6H PRN Sindy Guadeloupe, NP       alum & mag hydroxide-simeth (MAALOX/MYLANTA) 200-200-20 MG/5ML suspension 30 mL  30 mL Oral Q4H PRN Sindy Guadeloupe, NP       benzonatate (TESSALON) capsule 100 mg  100 mg Oral TID PRN Sindy Guadeloupe, NP       cyclobenzaprine (FLEXERIL) tablet 10 mg  10 mg Oral QHS Sindy Guadeloupe, NP       DULoxetine (CYMBALTA) DR capsule 90 mg  90 mg Oral QHS Sindy Guadeloupe, NP       gabapentin (NEURONTIN) capsule 600 mg  600 mg Oral TID Sindy Guadeloupe, NP   600 mg at 04/09/23 0913   guaiFENesin (MUCINEX) 12 hr tablet 600 mg  600 mg Oral BID Sindy Guadeloupe, NP       hydrOXYzine (ATARAX) tablet 25 mg  25 mg Oral TID PRN Sindy Guadeloupe, NP       magnesium hydroxide (MILK OF MAGNESIA) suspension 30 mL  30 mL Oral Daily PRN Sindy Guadeloupe, NP       metFORMIN (GLUCOPHAGE) tablet 500 mg  500 mg Oral Q breakfast Sindy Guadeloupe, NP   500 mg at 04/09/23 0748   OLANZapine (ZYPREXA) injection 10 mg  10 mg Intramuscular TID PRN Sindy Guadeloupe, NP       OLANZapine (ZYPREXA) injection 5 mg  5 mg Intramuscular TID PRN Sindy Guadeloupe, NP       OLANZapine (ZYPREXA) tablet 5 mg  5 mg Oral TID PRN Sindy Guadeloupe, NP       OLANZapine zydis (ZYPREXA) disintegrating tablet 5 mg  5 mg Oral TID PRN Sindy Guadeloupe, NP   5 mg at 04/09/23 0748   prazosin (MINIPRESS) capsule 2 mg  2 mg Oral QHS Sindy Guadeloupe, NP       risperiDONE (RISPERDAL) tablet 2 mg  2 mg Oral BID Sindy Guadeloupe, NP       Current Outpatient Medications  Medication Sig Dispense Refill   albuterol (VENTOLIN HFA) 108 (90 Base) MCG/ACT inhaler Inhale 2 puffs into the lungs every 6 (six) hours as needed for wheezing or shortness of breath.     benztropine (COGENTIN) 1 MG tablet Take 1 mg by mouth daily.     DULoxetine (CYMBALTA) 60  MG capsule Take 60 mg by mouth daily.     gabapentin (NEURONTIN) 600 MG tablet Take 600 mg by mouth 3 (three) times daily.     ibuprofen (ADVIL) 200 MG tablet Take 400 mg by mouth every 6 (six) hours as needed for headache.     losartan (COZAAR) 100 MG tablet Take 100 mg by mouth daily.     metFORMIN (GLUCOPHAGE) 500 MG tablet Take 500 mg by mouth 2 (two) times daily.     metoprolol tartrate (LOPRESSOR) 25 MG tablet Take 25 mg by mouth 2 (two) times daily.     OLANZapine (ZYPREXA) 5 MG tablet Take 1 tablet (5 mg total) by mouth 3 (three) times daily as needed (significant agitation w/ threat of harm to self or others. Ordered TID not q8 OK to give early. Will adjust dosing tomorrow.). (Patient taking differently: Take 5 mg by mouth 2 (two) times daily.)     pantoprazole (PROTONIX) 40 MG tablet Take 1 tablet (40 mg total) by mouth daily before breakfast. 30 tablet 0   prazosin (MINIPRESS) 2 MG capsule Take 2 mg by mouth at bedtime.     propafenone (RYTHMOL SR) 425 MG 12 hr capsule Take 425 mg by mouth 2 (two) times daily.     SYMBICORT 160-4.5 MCG/ACT inhaler Inhale 2 puffs into the lungs in the morning and at bedtime.     UZEDY 125 MG/0.35ML SUSY Inject 125 mg into the skin every 30 (thirty) days.      PTA Medications:  Facility Ordered Medications  Medication   alum & mag hydroxide-simeth (MAALOX/MYLANTA)  200-200-20 MG/5ML suspension 30 mL   magnesium hydroxide (MILK OF MAGNESIA) suspension 30 mL   OLANZapine zydis (ZYPREXA) disintegrating tablet 5 mg   OLANZapine (ZYPREXA) injection 5 mg   OLANZapine (ZYPREXA) injection 10 mg   albuterol (PROVENTIL) (2.5 MG/3ML) 0.083% nebulizer solution 2.5 mg   benzonatate (TESSALON) capsule 100 mg   cyclobenzaprine (FLEXERIL) tablet 10 mg   DULoxetine (CYMBALTA) DR capsule 90 mg   gabapentin (NEURONTIN) capsule 600 mg   guaiFENesin (MUCINEX) 12 hr tablet 600 mg   hydrOXYzine (ATARAX) tablet 25 mg   metFORMIN (GLUCOPHAGE) tablet 500 mg   OLANZapine (ZYPREXA) tablet 5 mg   prazosin (MINIPRESS) capsule 2 mg   risperiDONE (RISPERDAL) tablet 2 mg   PTA Medications  Medication Sig   pantoprazole (PROTONIX) 40 MG tablet Take 1 tablet (40 mg total) by mouth daily before breakfast.   metFORMIN (GLUCOPHAGE) 500 MG tablet Take 500 mg by mouth 2 (two) times daily.   gabapentin (NEURONTIN) 600 MG tablet Take 600 mg by mouth 3 (three) times daily.   SYMBICORT 160-4.5 MCG/ACT inhaler Inhale 2 puffs into the lungs in the morning and at bedtime.   prazosin (MINIPRESS) 2 MG capsule Take 2 mg by mouth at bedtime.   OLANZapine (ZYPREXA) 5 MG tablet Take 1 tablet (5 mg total) by mouth 3 (three) times daily as needed (significant agitation w/ threat of harm to self or others. Ordered TID not q8 OK to give early. Will adjust dosing tomorrow.). (Patient taking differently: Take 5 mg by mouth 2 (two) times daily.)   losartan (COZAAR) 100 MG tablet Take 100 mg by mouth daily.   metoprolol tartrate (LOPRESSOR) 25 MG tablet Take 25 mg by mouth 2 (two) times daily.   propafenone (RYTHMOL SR) 425 MG 12 hr capsule Take 425 mg by mouth 2 (two) times daily.   DULoxetine (CYMBALTA) 60 MG capsule Take 60 mg by  mouth daily.   UZEDY 125 MG/0.35ML SUSY Inject 125 mg into the skin every 30 (thirty) days.   benztropine (COGENTIN) 1 MG tablet Take 1 mg by mouth daily.   albuterol (VENTOLIN  HFA) 108 (90 Base) MCG/ACT inhaler Inhale 2 puffs into the lungs every 6 (six) hours as needed for wheezing or shortness of breath.   ibuprofen (ADVIL) 200 MG tablet Take 400 mg by mouth every 6 (six) hours as needed for headache.       03/10/2022   10:01 PM 03/10/2022    9:56 PM 02/10/2022    1:49 AM  Depression screen PHQ 2/9  Decreased Interest 1 1 2   Down, Depressed, Hopeless 1 1 2   PHQ - 2 Score 2 2 4   Altered sleeping 1 1 2   Tired, decreased energy 1 1 2   Change in appetite 1 0 2  Feeling bad or failure about yourself  1 1 2   Trouble concentrating 1 1 2   Moving slowly or fidgety/restless 1 1 1   Suicidal thoughts 1 1 1   PHQ-9 Score 9 8 16   Difficult doing work/chores Somewhat difficult  Very difficult    Flowsheet Row ED from 04/08/2023 in [facility] ED from 03/16/2023 in [facility] Emergency Department at [name] ED to Hosp-Admission (Discharged) from 02/17/2023 in [facility] MEDICAL UNIT  C-SSRS RISK CATEGORY Moderate Risk No Risk High Risk       Musculoskeletal  Strength & Muscle Tone: within normal limits Gait & Station: normal Patient leans: N/A  Psychiatric Specialty Exam  Presentation  General Appearance:  Casual  Eye Contact: Good  Speech: Clear and Coherent  Speech Volume: Normal  Handedness: Right   Mood and Affect  Mood: Anxious; Depressed  Affect: Congruent   Thought Process  Thought Processes: Coherent  Descriptions of Associations:Intact  Orientation:Full (Time, Place and Person)  Thought Content:Illogical  Diagnosis of Schizophrenia or Schizoaffective disorder in past: Yes  Duration of Psychotic Symptoms: Greater than six months   Hallucinations:Hallucinations: Auditory Description of Auditory Hallucinations: voices telling her she either going to prison or a group home  Ideas of Reference:None  Suicidal Thoughts:Suicidal Thoughts: No  Homicidal  Thoughts:Homicidal Thoughts: No   Sensorium  Memory: Immediate Good  Judgment: Fair  Insight: Fair   Art therapist  Concentration: Good  Attention Span: Good  Recall: Good  Fund of Knowledge: Good  Language: Good   Psychomotor Activity  Psychomotor Activity: Psychomotor Activity: Normal   Assets  Assets: Desire for Improvement   Sleep  Sleep: Sleep: Fair Number of Hours of Sleep: 8   Nutritional Assessment (For OBS and FBC admissions only) Has the patient had a weight loss or gain of 10 pounds or more in the last 3 months?: No Has the patient had a decrease in food intake/or appetite?: No Does the patient have dental problems?: No Does the patient have eating habits or behaviors that may be indicators of an eating disorder including binging or inducing vomiting?: No Has the patient recently lost weight without trying?: 0 Has the patient been eating poorly because of a decreased appetite?: 0 Malnutrition Screening Tool Score: 0    Physical Exam  Physical Exam Constitutional:      Appearance: Normal appearance.  Eyes:     General:        Right eye: No discharge.        Left eye: No discharge.  Cardiovascular:     Rate and Rhythm: Tachycardia  present.  Pulmonary:     Effort: Pulmonary effort is normal.  Musculoskeletal:        General: Normal range of motion.     Cervical back: Normal range of motion.  Neurological:     Mental Status: She is oriented to person, place, and time.  Psychiatric:        Attention and Perception: Attention normal. She perceives auditory hallucinations.        Mood and Affect: Mood is anxious.        Speech: Speech normal.        Behavior: Behavior is not agitated. Behavior is cooperative.        Thought Content: Thought content normal.        Cognition and Memory: Cognition normal.        Judgment: Judgment normal.    Review of Systems  Constitutional:  Negative for chills and fever.  HENT:  Negative  for hearing loss.   Respiratory:  Negative for cough and shortness of breath.   Cardiovascular:  Negative for chest pain.  Musculoskeletal: Negative.   Neurological:  Negative for dizziness, tremors, seizures and headaches.  Psychiatric/Behavioral:  Positive for hallucinations. The patient is nervous/anxious.    Blood pressure 124/72, pulse (!) 112, temperature 98.6 F (37 C), temperature source Oral, resp. rate 19, SpO2 97%. There is no height or weight on file to calculate BMI.  Demographic Factors:  Adolescent or young adult and Caucasian  Loss Factors: denies  Historical Factors: Prior suicide attempts and Impulsivity  Risk Reduction Factors:   Sense of responsibility to family, Employed, Living with another person, especially a relative, Positive social support, Positive therapeutic relationship, and Positive coping skills or problem solving skills  Continued Clinical Symptoms:  Severe Anxiety and/or Agitation Depression:   Impulsivity Schizophrenia:   Depressive state More than one psychiatric diagnosis Previous Psychiatric Diagnoses and Treatments  Cognitive Features That Contribute To Risk:  None    Suicide Risk:  Minimal: No identifiable suicidal ideation.  Patients presenting with no risk factors but with morbid ruminations; may be classified as minimal risk based on the severity of the depressive symptoms  Plan Of Care/Follow-up recommendations:  Activity:  as tolerated  Diet:  regular   Disposition:   Discharge patient   Patient agrees to follow-up with ACTT psychiatrist this week.    Ardis Hughs, NP 04/09/2023, 2:57 PM

## 2023-04-09 NOTE — ED Notes (Signed)
 Pt is currently sleeping, no distress noted, environmental check complete, will continue to monitor patient for safety.

## 2023-04-13 ENCOUNTER — Other Ambulatory Visit: Payer: Self-pay

## 2023-04-13 ENCOUNTER — Emergency Department (HOSPITAL_COMMUNITY)

## 2023-04-13 ENCOUNTER — Encounter (HOSPITAL_COMMUNITY): Payer: Self-pay | Admitting: Emergency Medicine

## 2023-04-13 ENCOUNTER — Emergency Department (HOSPITAL_COMMUNITY)
Admission: EM | Admit: 2023-04-13 | Discharge: 2023-04-13 | Disposition: A | Discharge status: Home / Self Care | Attending: Emergency Medicine | Admitting: Emergency Medicine

## 2023-04-13 DIAGNOSIS — Z5321 Procedure and treatment not carried out due to patient leaving prior to being seen by health care provider: Secondary | ICD-10-CM | POA: Insufficient documentation

## 2023-04-13 DIAGNOSIS — J45909 Unspecified asthma, uncomplicated: Secondary | ICD-10-CM | POA: Insufficient documentation

## 2023-04-13 DIAGNOSIS — R202 Paresthesia of skin: Secondary | ICD-10-CM | POA: Insufficient documentation

## 2023-04-13 DIAGNOSIS — R079 Chest pain, unspecified: Secondary | ICD-10-CM | POA: Diagnosis present

## 2023-04-13 DIAGNOSIS — R0789 Other chest pain: Secondary | ICD-10-CM | POA: Insufficient documentation

## 2023-04-13 DIAGNOSIS — F840 Autistic disorder: Secondary | ICD-10-CM | POA: Insufficient documentation

## 2023-04-13 LAB — CBC
HCT: 41.3 % (ref 36.0–46.0)
Hemoglobin: 12.4 g/dL (ref 12.0–15.0)
MCH: 24.3 pg — ABNORMAL LOW (ref 26.0–34.0)
MCHC: 30 g/dL (ref 30.0–36.0)
MCV: 80.8 fL (ref 80.0–100.0)
Platelets: 380 10*3/uL (ref 150–400)
RBC: 5.11 MIL/uL (ref 3.87–5.11)
RDW: 15.2 % (ref 11.5–15.5)
WBC: 8.9 10*3/uL (ref 4.0–10.5)
nRBC: 0 % (ref 0.0–0.2)

## 2023-04-13 LAB — HCG, SERUM, QUALITATIVE: Preg, Serum: NEGATIVE

## 2023-04-13 LAB — BASIC METABOLIC PANEL
Anion gap: 11 (ref 5–15)
BUN: 11 mg/dL (ref 6–20)
CO2: 23 mmol/L (ref 22–32)
Calcium: 9.4 mg/dL (ref 8.9–10.3)
Chloride: 103 mmol/L (ref 98–111)
Creatinine, Ser: 1.09 mg/dL — ABNORMAL HIGH (ref 0.44–1.00)
GFR, Estimated: >60 mL/min (ref >60)
Glucose, Bld: 117 mg/dL — ABNORMAL HIGH (ref 70–99)
Potassium: 3.7 mmol/L (ref 3.5–5.1)
Sodium: 137 mmol/L (ref 135–145)

## 2023-04-13 LAB — TROPONIN I (HIGH SENSITIVITY): Troponin I (High Sensitivity): <2 ng/L (ref <18)

## 2023-04-13 NOTE — ED Notes (Addendum)
 Pt refuses to come back to a room to be seen. Offered room to pt multiple times, pt got up and walked outside.

## 2023-04-13 NOTE — ED Triage Notes (Signed)
 Patient presents due to chest pain for 2 weeks. Pain becomes worse with palpation. She also complains of left leg tingling.     HX: Anxiety             EMS vitals:             144/92 BP      90 Pulse      20 RR

## 2023-04-13 NOTE — ED Notes (Signed)
 Patient refuses to sign the MSE waiver form.

## 2023-04-14 ENCOUNTER — Emergency Department (HOSPITAL_COMMUNITY)
Admission: EM | Admit: 2023-04-14 | Discharge: 2023-04-14 | Discharge status: Against Medical Advice | Attending: Emergency Medicine | Admitting: Emergency Medicine

## 2023-04-14 ENCOUNTER — Other Ambulatory Visit: Payer: Self-pay

## 2023-04-14 ENCOUNTER — Encounter (HOSPITAL_COMMUNITY): Payer: Self-pay

## 2023-04-14 ENCOUNTER — Emergency Department (HOSPITAL_COMMUNITY)
Admission: EM | Admit: 2023-04-14 | Discharge: 2023-04-14 | Disposition: A | Discharge status: Home / Self Care | Source: Home / Self Care | Attending: Emergency Medicine | Admitting: Emergency Medicine

## 2023-04-14 DIAGNOSIS — R0789 Other chest pain: Secondary | ICD-10-CM

## 2023-04-14 DIAGNOSIS — R079 Chest pain, unspecified: Secondary | ICD-10-CM | POA: Diagnosis present

## 2023-04-14 DIAGNOSIS — Z5321 Procedure and treatment not carried out due to patient leaving prior to being seen by health care provider: Secondary | ICD-10-CM | POA: Diagnosis not present

## 2023-04-14 LAB — BASIC METABOLIC PANEL
Anion gap: 13 (ref 5–15)
BUN: 13 mg/dL (ref 6–20)
CO2: 22 mmol/L (ref 22–32)
Calcium: 9.4 mg/dL (ref 8.9–10.3)
Chloride: 103 mmol/L (ref 98–111)
Creatinine, Ser: 0.92 mg/dL (ref 0.44–1.00)
GFR, Estimated: >60 mL/min (ref >60)
Glucose, Bld: 99 mg/dL (ref 70–99)
Potassium: 3.9 mmol/L (ref 3.5–5.1)
Sodium: 138 mmol/L (ref 135–145)

## 2023-04-14 LAB — CBC WITH DIFFERENTIAL/PLATELET
Abs Immature Granulocytes: 0.05 10*3/uL (ref 0.00–0.07)
Basophils Absolute: 0 10*3/uL (ref 0.0–0.1)
Basophils Relative: 0 %
Eosinophils Absolute: 0.1 10*3/uL (ref 0.0–0.5)
Eosinophils Relative: 0 %
HCT: 40 % (ref 36.0–46.0)
Hemoglobin: 12.3 g/dL (ref 12.0–15.0)
Immature Granulocytes: 0 %
Lymphocytes Relative: 22 %
Lymphs Abs: 2.5 10*3/uL (ref 0.7–4.0)
MCH: 24.7 pg — ABNORMAL LOW (ref 26.0–34.0)
MCHC: 30.8 g/dL (ref 30.0–36.0)
MCV: 80.5 fL (ref 80.0–100.0)
Monocytes Absolute: 0.8 10*3/uL (ref 0.1–1.0)
Monocytes Relative: 7 %
Neutro Abs: 8.3 10*3/uL — ABNORMAL HIGH (ref 1.7–7.7)
Neutrophils Relative %: 71 %
Platelets: 368 10*3/uL (ref 150–400)
RBC: 4.97 MIL/uL (ref 3.87–5.11)
RDW: 15.3 % (ref 11.5–15.5)
WBC: 11.8 10*3/uL — ABNORMAL HIGH (ref 4.0–10.5)
nRBC: 0 % (ref 0.0–0.2)

## 2023-04-14 LAB — TROPONIN I (HIGH SENSITIVITY)
Troponin I (High Sensitivity): <2 ng/L (ref <18)
Troponin I (High Sensitivity): <2 ng/L (ref <18)

## 2023-04-14 LAB — D-DIMER, QUANTITATIVE: D-Dimer, Quant: <0.27 ug{FEU}/mL (ref 0.00–0.50)

## 2023-04-14 MED ORDER — IBUPROFEN 200 MG PO TABS
600.0000 mg | ORAL_TABLET | Freq: Once | ORAL | Status: DC
  Filled 2023-04-14: qty 3

## 2023-04-14 NOTE — ED Notes (Signed)
 Patient refused pain meds, given crackers and water. Offered a sandwich stated she did not want anything.

## 2023-04-14 NOTE — ED Triage Notes (Signed)
 Pt c/o chest pain that radiates to her back.

## 2023-04-14 NOTE — ED Notes (Signed)
 Pt was throwing chairs in the lobby, states that "she needs to talk to somebody and has a lot going on" explained that I have a room for her once again but her behaviors of throwing chairs will not be tolerated. Pt escorted back to room.

## 2023-04-14 NOTE — ED Triage Notes (Signed)
 Pt states that she now wants to be seen for her CP and returned to the ED

## 2023-04-14 NOTE — ED Provider Notes (Addendum)
 Patient brought in by EMS.  Patient with a complaint of substernal chest pain that radiates to the back now for several weeks.  Patient seen at [facility] emergency department for this complaint yesterday troponin was less than 2 chest x-ray was negative pregnancy test was negative labs without significant abnormalities.  Patient also states she has not anything to eat or drink for 4 days.  Chart review does show that she does have a extensive behavioral health history.  Patient just discharged from [facility] at about 5 in the morning.  Patient denies any vomiting or shortness of breath.  Vital signs here today temp 98.4 respirations 16 heart rate 116 blood pressure 123/91 and oxygen saturations on room air is 100%. Patient nontoxic no acute distress Heart regular rate and rhythm Lungs clear to auscultation bilaterally Abdomen soft nontender  Will repeat troponins will not repeat chest x-ray because was negative yesterday will check CBC basic metabolic panel and D-dimer.   EKG without any acute changes.  This was EKG done at [facility] will repeat 1 today.   Vanetta Mulders, MD 04/14/23 1610    Vanetta Mulders, MD 04/14/23 [phone]

## 2023-04-14 NOTE — ED Notes (Signed)
 Pt not answering to multiple calls for room

## 2023-04-14 NOTE — ED Provider Notes (Signed)
 [place] EMERGENCY DEPARTMENT AT [facility] Provider Note  CSN: [id number] Arrival date & time: 04/13/23 2358  Chief Complaint(s) Chest Pain  HPI [name] is a 33 y.o. female here with 3 days of left-sided chest wall pain worse with palpation and range of motion.  Patient denies any fall or trauma.  Pain is nonradiating and nonexertional.  No associated shortness of breath.  No nausea or vomiting.  No coughing or congestion.  No other physical complaints.  The history is provided by the patient.    Past Medical History 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 fragile X syndrome    Chronic constipation    Depression    Drug-seeking behavior    Essential tremor    Headache    Ineffective individual coping 05/16/2022   Insomnia 01/12/2022   Intentional drug overdose (HCC) 06/05/2022   Neuromuscular disorder (HCC)    Normocytic anemia 06/05/2022   Overdose 07/22/2017   Overdose of acetaminophen 07/2017   and other meds   Overdose, intentional self-harm, initial encounter (HCC) 07/20/2021   Paranoia (HCC) 04/22/2021   Paroxysmal atrial fibrillation (HCC)    Personality disorder (HCC)    Prolonged QT interval    Purposeful non-suicidal drug ingestion (HCC) 06/27/2021   Schizo-affective psychosis (HCC)    Schizoaffective disorder (HCC) 07/29/2022   Schizoaffective disorder, bipolar type (HCC)    Seizures (HCC)    Last seizure December 2017   Skin erythema 04/27/2022   Sleep apnea    Suicidal behavior 07/25/2021   Suicidal ideation    Suicide (HCC) 07/01/2021   Suicide attempt (HCC) 07/04/2021   Patient Active Problem List   Diagnosis Date Noted    Bipolar affective disorder, currently manic, moderate (HCC) 02/20/2023   Intraventricular conduction delay 02/18/2023   Moderate persistent asthma 02/18/2023   Prediabetes 02/18/2023   GERD (gastroesophageal reflux disease) 02/18/2023   Paroxysmal atrial fibrillation (HCC) 02/18/2023   Anemia of chronic disease 02/18/2023   Intentional overdose of drug in tablet form (HCC) 02/17/2023   Prolonged Q-T interval on ECG 12/23/2022   Overdose 08/22/2022   Drug overdose 08/22/2022   Intentional overdose (HCC) 06/05/2022   Malingering 02/22/2022   Suicide attempt by cutting of wrist (HCC) 10/11/2021   Self-injurious behavior 07/19/2021   Suicide attempt by drug ingestion (HCC) 07/04/2021   GAD (generalized anxiety disorder) 04/22/2021   Intentional acetaminophen overdose (HCC)    Lactic acidosis    Hyperprolactinemia (HCC) 08/20/2016   Seizure disorder (HCC) 08/08/2015   Migraines 07/27/2015   Schizoaffective disorder, bipolar type (HCC) 03/10/2014   PTSD (post-traumatic stress disorder) 03/10/2014   Borderline personality disorder (HCC) 10/31/2013   Asperger syndrome 06/15/2013   Home Medication(s) Prior to Admission medications   Medication Sig Start Date End Date Taking? Authorizing Provider  albuterol (PROVENTIL) (2.5 MG/3ML) 0.083% nebulizer solution Take 3 mLs (2.5 mg total) by nebulization every 6 (six) hours as needed for wheezing or shortness of breath. 04/09/23   Ardis Hughs, NP  albuterol (VENTOLIN HFA) 108 (90 Base) MCG/ACT inhaler Inhale 2 puffs into the lungs every 6 (six) hours as needed for wheezing or shortness of breath.  04/09/23   Ardis Hughs, NP  benzonatate (TESSALON) 100 MG capsule Take 1 capsule (100 mg total) by mouth 3 (three) times daily as needed for cough. 04/09/23   Ardis Hughs, NP  benztropine (COGENTIN) 1 MG tablet Take 1 mg by mouth daily. 03/21/23   [provider]  cyclobenzaprine (FLEXERIL) 10 MG tablet Take 1 tablet (10 mg total) by  mouth at bedtime. 04/09/23   Ardis Hughs, NP  DULoxetine (CYMBALTA) 30 MG capsule Take 3 capsules (90 mg total) by mouth at bedtime. 04/09/23   Ardis Hughs, NP  gabapentin (NEURONTIN) 300 MG capsule Take 2 capsules (600 mg total) by mouth 3 (three) times daily. 04/09/23   Ardis Hughs, NP  guaiFENesin (MUCINEX) 600 MG 12 hr tablet Take 1 tablet (600 mg total) by mouth 2 (two) times daily. 04/09/23   Ardis Hughs, NP  hydrOXYzine (ATARAX) 25 MG tablet Take 1 tablet (25 mg total) by mouth 3 (three) times daily as needed for anxiety. 04/09/23   Ardis Hughs, NP  ibuprofen (ADVIL) 200 MG tablet Take 400 mg by mouth every 6 (six) hours as needed for headache.    [provider]  losartan (COZAAR) 100 MG tablet Take 100 mg by mouth daily. 03/15/23   [provider]  metFORMIN (GLUCOPHAGE) 500 MG tablet Take 1 tablet (500 mg total) by mouth daily with breakfast. 04/10/23   Ardis Hughs, NP  metoprolol tartrate (LOPRESSOR) 25 MG tablet Take 25 mg by mouth 2 (two) times daily. 03/08/23   [provider]  OLANZapine (ZYPREXA) 5 MG tablet Take 1 tablet (5 mg total) by mouth 3 (three) times daily as needed (significant agitation w/ threat of harm to self or others. Ordered TID not q8 OK to give early. Will adjust dosing tomorrow.). 04/09/23   Ardis Hughs, NP  pantoprazole (PROTONIX) 40 MG tablet Take 1 tablet (40 mg total) by mouth daily before breakfast. 01/15/23 04/09/23  Starleen Blue, NP  prazosin (MINIPRESS) 2 MG capsule Take 2 mg by mouth at bedtime. 02/13/23   [provider]  propafenone (RYTHMOL SR) 425 MG 12 hr capsule Take 425 mg by mouth 2 (two) times daily. 03/18/23   [provider]  risperiDONE (RISPERDAL) 2 MG tablet Take 1 tablet (2 mg total) by mouth 2 (two) times daily. 04/09/23   Ardis Hughs, NP  SYMBICORT 160-4.5 MCG/ACT inhaler Inhale 2 puffs into the lungs in the morning and at bedtime. 01/24/23   [provider]  UZEDY 125 MG/0.35ML SUSY Inject 0.35 mLs (125 mg total) into the skin every 30 (thirty) days. 04/09/23   Ardis Hughs, NP                                                                                                                                    Allergies Bee venom, Coconut flavoring agent (non-screening), Fish allergy,  Geodon [ziprasidone hcl], Haloperidol and related, Lithobid [lithium], Roxicodone [oxycodone], Seroquel [quetiapine], Shellfish allergy, Phenergan [promethazine hcl], Prilosec [omeprazole], Sulfa antibiotics, Tegretol [carbamazepine], Prozac [fluoxetine], Tape, and Tylenol [acetaminophen]  Review of Systems Review of Systems As noted in HPI  Physical Exam Vital Signs  I have reviewed the triage vital signs BP 121/72 (BP Location: Right Arm)   Pulse 89   Temp 97.9 F (36.6 C) (Oral)   Resp 16   SpO2 99%   Physical Exam Vitals reviewed.  Constitutional:      General: She is not in acute distress.    Appearance: She is well-developed. She is not diaphoretic.  HENT:     Head: Normocephalic and atraumatic.     Nose: Nose normal.  Eyes:     General: No scleral icterus.       Right eye: No discharge.        Left eye: No discharge.     Conjunctiva/sclera: Conjunctivae normal.     Pupils: Pupils are equal, round, and reactive to light.  Cardiovascular:     Rate and Rhythm: Normal rate and regular rhythm.     Heart sounds: No murmur heard.    No friction rub. No gallop.  Pulmonary:     Effort: Pulmonary effort is normal. No respiratory distress.     Breath sounds: Normal breath sounds. No stridor. No rales.  Chest:     Chest wall: Tenderness present.    Abdominal:     General: There is no distension.     Palpations: Abdomen is soft.     Tenderness: There is no abdominal tenderness.  Musculoskeletal:        General: No tenderness.     Cervical back: Normal range of motion and neck supple.  Skin:    General: Skin is warm and dry.     Findings:  No erythema or rash.  Neurological:     Mental Status: She is alert and oriented to person, place, and time.     ED Results and Treatments Labs (all labs ordered are listed, but only abnormal results are displayed) Labs Reviewed - No data to display                                                                                                                       EKG  EKG Interpretation Date/Time:  Sunday April 14 2023 00:18:43 EST Ventricular Rate:  95 PR Interval:  138 QRS Duration:  88 QT Interval:  376 QTC Calculation: 472 R Axis:   69  Text Interpretation: Normal sinus rhythm Normal ECG When compared with ECG of 13-Apr-2023 22:28, PREVIOUS ECG IS PRESENT Confirmed by Drema Pry [phone]) on 04/14/2023 12:35:27 AM       Radiology DG Chest 2 View Result Date: 04/13/2023 CLINICAL DATA:  Chest pain for 2 weeks. EXAM: CHEST - 2 VIEW COMPARISON:  January 22, 2023 FINDINGS: The heart size and mediastinal contours are within normal limits. A radiopaque monitor is seen overlying the  mid left lung. Both lungs are clear. The visualized skeletal structures are unremarkable. IMPRESSION: No active cardiopulmonary disease. Electronically Signed   By: Aram Candela M.D.   On: 04/13/2023 22:40    Medications Ordered in ED Medications  ibuprofen (ADVIL) tablet 600 mg (600 mg Oral Not Given 04/14/23 0357)   Procedures Procedures  (including critical care time) Medical Decision Making / ED Course   Medical Decision Making Risk OTC drugs.    Chest pain  History and exam consistent with chest wall pain likely from muscle strain. EKG without acute ischemic changes.  Doubt ACS.  Chest x-ray from earlier in the evening negative for any pneumonia, pneumothorax, pulmonary edema pleural effusions.  Low suspicion for pulmonary embolism.  Presentation cough for the past esophageal perforation.  Patient requested to speak to behavioral health for stress issues.  She denied any SI, HI,  AVH.  No need for emergent psych evaluation.  Provided with behavioral health urgent care information.  Recommended she walk at any time.    Final Clinical Impression(s) / ED Diagnoses Final diagnoses:  Chest wall pain   The patient appears reasonably screened and/or stabilized for discharge and I doubt any other medical condition or other [facility] requiring further screening, evaluation, or treatment in the ED at this time. I have discussed the findings, Dx and Tx plan with the patient/family who expressed understanding and agree(s) with the plan. Discharge instructions discussed at length. The patient/family was given strict return precautions who verbalized understanding of the instructions. No further questions at time of discharge.  Disposition: Discharge  Condition: Good  ED Discharge Orders     None         Follow Up: [facility] Address: [address] [place], [place] [address] Hours: Open 24 hours Monday through Sunday Phone: [phone] Go to      This chart was dictated using voice recognition software.  Despite best efforts to proofread,  errors can occur which can change the documentation meaning.    Nira Conn, MD 04/14/23 [phone]

## 2023-04-15 ENCOUNTER — Other Ambulatory Visit: Payer: Self-pay

## 2023-04-15 ENCOUNTER — Encounter (HOSPITAL_COMMUNITY): Payer: Self-pay

## 2023-04-15 ENCOUNTER — Emergency Department (HOSPITAL_COMMUNITY)
Admission: EM | Admit: 2023-04-15 | Discharge: 2023-04-17 | Disposition: A | Discharge status: Other Acute Inpatient Hospital | Source: Home / Self Care | Attending: Emergency Medicine | Admitting: Emergency Medicine

## 2023-04-15 ENCOUNTER — Emergency Department (HOSPITAL_COMMUNITY)

## 2023-04-15 ENCOUNTER — Encounter (HOSPITAL_COMMUNITY): Payer: Self-pay | Admitting: Emergency Medicine

## 2023-04-15 ENCOUNTER — Emergency Department (HOSPITAL_COMMUNITY)
Admission: EM | Admit: 2023-04-15 | Discharge: 2023-04-15 | Disposition: A | Discharge status: Other Acute Inpatient Hospital | Attending: Emergency Medicine | Admitting: Emergency Medicine

## 2023-04-15 DIAGNOSIS — R0602 Shortness of breath: Secondary | ICD-10-CM | POA: Diagnosis present

## 2023-04-15 DIAGNOSIS — F309 Manic episode, unspecified: Secondary | ICD-10-CM

## 2023-04-15 DIAGNOSIS — F3112 Bipolar disorder, current episode manic without psychotic features, moderate: Secondary | ICD-10-CM | POA: Diagnosis present

## 2023-04-15 DIAGNOSIS — F603 Borderline personality disorder: Secondary | ICD-10-CM | POA: Diagnosis present

## 2023-04-15 DIAGNOSIS — F312 Bipolar disorder, current episode manic severe with psychotic features: Secondary | ICD-10-CM | POA: Insufficient documentation

## 2023-04-15 DIAGNOSIS — Z5321 Procedure and treatment not carried out due to patient leaving prior to being seen by health care provider: Secondary | ICD-10-CM | POA: Diagnosis not present

## 2023-04-15 LAB — BASIC METABOLIC PANEL
Anion gap: 14 (ref 5–15)
BUN: 11 mg/dL (ref 6–20)
CO2: 22 mmol/L (ref 22–32)
Calcium: 8.9 mg/dL (ref 8.9–10.3)
Chloride: 103 mmol/L (ref 98–111)
Creatinine, Ser: 0.65 mg/dL (ref 0.44–1.00)
GFR, Estimated: >60 mL/min (ref >60)
Glucose, Bld: 107 mg/dL — ABNORMAL HIGH (ref 70–99)
Potassium: 3.3 mmol/L — ABNORMAL LOW (ref 3.5–5.1)
Sodium: 139 mmol/L (ref 135–145)

## 2023-04-15 LAB — CBC WITH DIFFERENTIAL/PLATELET
Abs Immature Granulocytes: 0.03 10*3/uL (ref 0.00–0.07)
Basophils Absolute: 0 10*3/uL (ref 0.0–0.1)
Basophils Relative: 0 %
Eosinophils Absolute: 0.1 10*3/uL (ref 0.0–0.5)
Eosinophils Relative: 1 %
HCT: 36.5 % (ref 36.0–46.0)
Hemoglobin: 11.4 g/dL — ABNORMAL LOW (ref 12.0–15.0)
Immature Granulocytes: 0 %
Lymphocytes Relative: 20 %
Lymphs Abs: 1.6 10*3/uL (ref 0.7–4.0)
MCH: 24.9 pg — ABNORMAL LOW (ref 26.0–34.0)
MCHC: 31.2 g/dL (ref 30.0–36.0)
MCV: 79.9 fL — ABNORMAL LOW (ref 80.0–100.0)
Monocytes Absolute: 0.6 10*3/uL (ref 0.1–1.0)
Monocytes Relative: 7 %
Neutro Abs: 6 10*3/uL (ref 1.7–7.7)
Neutrophils Relative %: 72 %
Platelets: 246 10*3/uL (ref 150–400)
RBC: 4.57 MIL/uL (ref 3.87–5.11)
RDW: 15.4 % (ref 11.5–15.5)
WBC: 8.3 10*3/uL (ref 4.0–10.5)
nRBC: 0 % (ref 0.0–0.2)

## 2023-04-15 LAB — ETHANOL: Alcohol, Ethyl (B): <10 mg/dL (ref <10)

## 2023-04-15 MED ORDER — ZIPRASIDONE MESYLATE 20 MG IM SOLR
10.0000 mg | Freq: Once | INTRAMUSCULAR | Status: AC
  Administered 2023-04-15: 10 mg via INTRAMUSCULAR
  Filled 2023-04-15: qty 20

## 2023-04-15 MED ORDER — CARMEX CLASSIC LIP BALM EX OINT
TOPICAL_OINTMENT | Freq: Once | CUTANEOUS | Status: AC
  Filled 2023-04-15: qty 10

## 2023-04-15 MED ORDER — STERILE WATER FOR INJECTION IJ SOLN
INTRAMUSCULAR | Status: AC
  Filled 2023-04-15: qty 10

## 2023-04-15 NOTE — ED Notes (Addendum)
 Called for vitals no answer, pt not outside

## 2023-04-15 NOTE — ED Notes (Signed)
 Called for vitals no answer, pt not outside

## 2023-04-15 NOTE — ED Notes (Signed)
 Patient was sleeping when entering her room. Patient's food tray was still present in the room and did not look touched. Patient refused to allow vitals to be taken. When attempting to obtain vitals patient pulled away.

## 2023-04-15 NOTE — ED Provider Notes (Addendum)
 [place] EMERGENCY DEPARTMENT AT [facility] Provider Note   CSN: [id number] Arrival date & time: 04/15/23  1200     History  Chief Complaint  Patient presents with   Shortness of Breath    [name] is a 33 y.o. female.  Patient has a history of schizoaffective disorder and bipolar.  She states that someone is trying to kill her and her family.  The history is provided by the patient and medical records. No language interpreter was used.  Altered Mental Status Presenting symptoms: behavior changes   Severity:  Moderate Most recent episode:  Today Episode history:  Continuous Timing:  Constant Progression:  Worsening Chronicity:  New Context: not alcohol use   Associated symptoms: no abdominal pain, no hallucinations, no headaches, no rash and no seizures        Home Medications Prior to Admission medications   Medication Sig Start Date End Date Taking? Authorizing Provider  albuterol (PROVENTIL) (2.5 MG/3ML) 0.083% nebulizer solution Take 3 mLs (2.5 mg total) by nebulization every 6 (six) hours as needed for wheezing or shortness of breath. 04/09/23   Ardis Hughs, NP  albuterol (VENTOLIN HFA) 108 (90 Base) MCG/ACT inhaler Inhale 2 puffs into the lungs every 6 (six) hours as needed for wheezing or shortness of breath. 04/09/23   Ardis Hughs, NP  benzonatate (TESSALON) 100 MG capsule Take 1 capsule (100 mg total) by mouth 3 (three) times daily as needed for cough. 04/09/23   Ardis Hughs, NP  benztropine (COGENTIN) 1 MG tablet Take 1 mg by mouth daily. 03/21/23   [provider]  cyclobenzaprine (FLEXERIL) 10 MG tablet Take 1 tablet (10 mg total) by mouth at bedtime. 04/09/23   Ardis Hughs, NP  DULoxetine (CYMBALTA) 30 MG capsule Take 3 capsules (90 mg total) by mouth at bedtime. 04/09/23   Ardis Hughs, NP  gabapentin (NEURONTIN) 300 MG capsule Take 2 capsules (600 mg total) by mouth 3 (three) times daily. 04/09/23    Ardis Hughs, NP  guaiFENesin (MUCINEX) 600 MG 12 hr tablet Take 1 tablet (600 mg total) by mouth 2 (two) times daily. 04/09/23   Ardis Hughs, NP  hydrOXYzine (ATARAX) 25 MG tablet Take 1 tablet (25 mg total) by mouth 3 (three) times daily as needed for anxiety. 04/09/23   Ardis Hughs, NP  ibuprofen (ADVIL) 200 MG tablet Take 400 mg by mouth every 6 (six) hours as needed for headache.    [provider]  losartan (COZAAR) 100 MG tablet Take 100 mg by mouth daily. 03/15/23   [provider]  metFORMIN (GLUCOPHAGE) 500 MG tablet Take 1 tablet (500 mg total) by mouth daily with breakfast. 04/10/23   Ardis Hughs, NP  metoprolol tartrate (LOPRESSOR) 25 MG tablet Take 25 mg by mouth 2 (two) times daily. 03/08/23   [provider]  OLANZapine (ZYPREXA) 5 MG tablet Take 1 tablet (5 mg total) by mouth 3 (three) times daily as needed (significant agitation w/ threat of harm to self or others. Ordered TID not q8 OK to give early. Will adjust dosing tomorrow.). 04/09/23   Ardis Hughs, NP  pantoprazole (PROTONIX) 40 MG tablet Take 1 tablet (40 mg total) by mouth daily before breakfast. 01/15/23 04/15/23  Starleen Blue, NP  prazosin (MINIPRESS) 2 MG capsule Take 2 mg by mouth at bedtime. 02/13/23   [provider]  propafenone (RYTHMOL SR) 425 MG 12 hr capsule Take 425 mg by  mouth 2 (two) times daily. 03/18/23   [provider]  risperiDONE (RISPERDAL) 2 MG tablet Take 1 tablet (2 mg total) by mouth 2 (two) times daily. 04/09/23   Ardis Hughs, NP  SYMBICORT 160-4.5 MCG/ACT inhaler Inhale 2 puffs into the lungs in the morning and at bedtime. 01/24/23   [provider]  UZEDY 125 MG/0.35ML SUSY Inject 0.35 mLs (125 mg total) into the skin every 30 (thirty) days. 04/09/23   Ardis Hughs, NP      Allergies    Bee venom, Coconut flavoring agent (non-screening), Fish allergy, Geodon [ziprasidone hcl], Haloperidol and related, Lithobid  [lithium], Roxicodone [oxycodone], Seroquel [quetiapine], Shellfish allergy, Phenergan [promethazine hcl], Prilosec [omeprazole], Sulfa antibiotics, Tegretol [carbamazepine], Prozac [fluoxetine], Tape, and Tylenol [acetaminophen]    Review of Systems   Review of Systems  Constitutional:  Negative for appetite change and fatigue.  HENT:  Negative for congestion, ear discharge and sinus pressure.   Eyes:  Negative for discharge.  Respiratory:  Negative for cough.   Cardiovascular:  Negative for chest pain.  Gastrointestinal:  Negative for abdominal pain and diarrhea.  Genitourinary:  Negative for frequency and hematuria.  Musculoskeletal:  Negative for back pain.  Skin:  Negative for rash.  Neurological:  Negative for seizures and headaches.  Psychiatric/Behavioral:  Positive for behavioral problems. Negative for hallucinations.     Physical Exam Updated Vital Signs BP 131/81 (BP Location: Left Arm)   Pulse (!) 110   Temp 98.3 F (36.8 C) (Oral)   Resp 20   LMP  (LMP Unknown)   SpO2 100%  Physical Exam Vitals and nursing note reviewed.  Constitutional:      Appearance: She is well-developed.  HENT:     Head: Normocephalic.     Nose: Nose normal.  Eyes:     General: No scleral icterus.    Conjunctiva/sclera: Conjunctivae normal.  Neck:     Thyroid: No thyromegaly.  Cardiovascular:     Rate and Rhythm: Normal rate and regular rhythm.     Heart sounds: No murmur heard.    No friction rub. No gallop.  Pulmonary:     Breath sounds: No stridor. No wheezing or rales.  Chest:     Chest wall: No tenderness.  Abdominal:     General: There is no distension.     Tenderness: There is no abdominal tenderness. There is no rebound.  Musculoskeletal:        General: Normal range of motion.     Cervical back: Neck supple.  Lymphadenopathy:     Cervical: No cervical adenopathy.  Skin:    Findings: No erythema or rash.  Neurological:     Mental Status: She is alert and oriented to  person, place, and time.     Motor: No abnormal muscle tone.     Coordination: Coordination normal.  Psychiatric:     Comments: Patient having visual hallucinations.  She sees her family in the emergency department.  She also states that someone is coming to kill her and her family.     ED Results / Procedures / Treatments   Labs (all labs ordered are listed, but only abnormal results are displayed) Labs Reviewed  ETHANOL  CBC WITH DIFFERENTIAL/PLATELET  BASIC METABOLIC PANEL  RAPID URINE DRUG SCREEN, HOSP PERFORMED    EKG None  Radiology DG Chest 2 View Result Date: 04/15/2023 CLINICAL DATA:  Dyspnea EXAM: CHEST - 2 VIEW COMPARISON:  None Available. FINDINGS: The heart size and mediastinal contours  are within normal limits. Both lungs are clear. The visualized skeletal structures are unremarkable. IMPRESSION: No active cardiopulmonary disease. Electronically Signed   By: Helyn Numbers M.D.   On: 04/15/2023 03:08   DG Chest 2 View Result Date: 04/13/2023 CLINICAL DATA:  Chest pain for 2 weeks. EXAM: CHEST - 2 VIEW COMPARISON:  January 22, 2023 FINDINGS: The heart size and mediastinal contours are within normal limits. A radiopaque monitor is seen overlying the mid left lung. Both lungs are clear. The visualized skeletal structures are unremarkable. IMPRESSION: No active cardiopulmonary disease. Electronically Signed   By: Aram Candela M.D.   On: 04/13/2023 22:40    Procedures Procedures    Medications Ordered in ED Medications  ziprasidone (GEODON) injection 10 mg (10 mg Intramuscular Given 04/15/23 1347)  sterile water (preservative free) injection (  Given 04/15/23 1348)    ED Course/ Medical Decision Making/ A&P  Patient with visual hallucinations and paranoid delusions.  She has been IVC need and behavioral health will see the patient. {  CRITICAL CARE Performed by: Bethann Berkshire Total critical care time: 40 minutes Critical care time was exclusive of separately  billable procedures and treating other patients. Critical care was necessary to treat or prevent imminent or life-threatening deterioration. Critical care was time spent personally by me on the following activities: development of treatment plan with patient and/or surrogate as well as nursing, discussions with consultants, evaluation of patient's response to treatment, examination of patient, obtaining history from patient or surrogate, ordering and performing treatments and interventions, ordering and review of laboratory studies, ordering and review of radiographic studies, pulse oximetry and re-evaluation of patient's condition.                              Medical Decision Making Amount and/or Complexity of Data Reviewed Labs: ordered.  Risk Prescription drug management.   Paranoid delusions with visual hallucinations.   Final Clinical Impression(s) / ED Diagnoses Final diagnoses:  None    Rx / DC Orders ED Discharge Orders     None         Bethann Berkshire, MD 04/15/23 1609    Bethann Berkshire, MD 04/15/23 1609

## 2023-04-15 NOTE — ED Notes (Signed)
 RN attempted to get blood since patient was asleep, patient woke up and refused labs

## 2023-04-15 NOTE — ED Notes (Addendum)
 PATIENT REFUSED LABS AND STATED SHE IS UNABLE TO GIVE URINE SAMPLE AT THIS TIME Estell Harpin, MD aware.

## 2023-04-15 NOTE — ED Triage Notes (Signed)
 Pt in with reported sob, also states she has been kicked out of her mom's house and she's been unable to take her psych meds or eat x 1 wk. Denies any SI/HI or hallucinations

## 2023-04-15 NOTE — ED Notes (Addendum)
 Patient was originally not going to allow me to draw her blood but with some further discussion from the nurse she finally agreed. Patient was going to allow this Tech (along with standby from security and the nurse) to get her blood but I was unable to find a good vein in her arm without worry of having to stick her multiple times, so with guidance from the nurse we decided to get a different Nurse to get her blood work. Charge nurse was notified by the nurse and she said she would be sending someone back to try and draw the labs.

## 2023-04-15 NOTE — ED Notes (Signed)
 Called for vitals, no answer, not outside

## 2023-04-15 NOTE — BHH Counselor (Signed)
 TTS Note:    @ 9:34 PM-Patient was deferred to [facility] Telehealth Care Coordinator (TCC), [place], at [phone]. The [facility] Care Coordinator will notify the patient's care team when the [facility] provider is ready to initiate the telehealth assessment. If there are any questions, please reach out to the [facility] Coordinator.

## 2023-04-15 NOTE — ED Triage Notes (Signed)
 EMS reports bystander called due to Pt lying down next to toad. EMS arrived found on street Pt c/o SOB. Pt states "my lungs have collapsed". Left last night AMA from [facility].   BP 140/90 HR 107 Spo2 99 RA RR 16

## 2023-04-15 NOTE — ED Notes (Signed)
 Pt getting agitated and refusing all labs. Pt IVCd. Given geodon, with support of security. Pt screaming and calling out family members names stating "they're trying to kill me"

## 2023-04-15 NOTE — Consult Note (Signed)
 Provider earlier approached Patient this afternoon for evaluation.  She was incoherent, disorganized manic.  She reported that she was brought in by her mother who is not happy she got married four days ago.  She pointed at room 17 stating her husband is there put in for deportation by her mother.  She pointed at room 18 stating her step son is in there and that her mother is planning to kill all of them.  Provider was informed she was just given Geodon.  Provider left. This evening Provider went back to speak with patient she refused to engage stating she is not ready to talk.  Patient ushered provider out repeating cannot speak now. Provider will try evaluation again this evening.

## 2023-04-16 ENCOUNTER — Encounter (HOSPITAL_COMMUNITY): Payer: Self-pay | Admitting: Emergency Medicine

## 2023-04-16 DIAGNOSIS — R0602 Shortness of breath: Secondary | ICD-10-CM | POA: Diagnosis not present

## 2023-04-16 DIAGNOSIS — F3112 Bipolar disorder, current episode manic without psychotic features, moderate: Secondary | ICD-10-CM | POA: Diagnosis not present

## 2023-04-16 MED ORDER — CYCLOBENZAPRINE HCL 10 MG PO TABS
10.0000 mg | ORAL_TABLET | Freq: Every day | ORAL | Status: DC
  Administered 2023-04-16: 10 mg via ORAL
  Filled 2023-04-16: qty 1

## 2023-04-16 MED ORDER — DULOXETINE HCL 30 MG PO CPEP: 90.0000 mg | ORAL_CAPSULE | Freq: Every day | ORAL | Status: DC

## 2023-04-16 MED ORDER — METOPROLOL TARTRATE 25 MG PO TABS
25.0000 mg | ORAL_TABLET | Freq: Two times a day (BID) | ORAL | Status: DC
  Administered 2023-04-16 – 2023-04-17 (×3): 25 mg via ORAL
  Filled 2023-04-16 (×3): qty 1

## 2023-04-16 MED ORDER — BENZTROPINE MESYLATE 1 MG PO TABS
1.0000 mg | ORAL_TABLET | Freq: Every day | ORAL | Status: DC
  Administered 2023-04-16 – 2023-04-17 (×2): 1 mg via ORAL
  Filled 2023-04-16 (×2): qty 1

## 2023-04-16 MED ORDER — DULOXETINE HCL 30 MG PO CPEP
60.0000 mg | ORAL_CAPSULE | Freq: Every day | ORAL | Status: DC
  Administered 2023-04-16: 60 mg via ORAL
  Filled 2023-04-16: qty 2

## 2023-04-16 MED ORDER — HYDROXYZINE HCL 25 MG PO TABS
25.0000 mg | ORAL_TABLET | Freq: Three times a day (TID) | ORAL | Status: DC | PRN
  Administered 2023-04-16: 25 mg via ORAL
  Filled 2023-04-16: qty 1

## 2023-04-16 MED ORDER — OLANZAPINE 5 MG PO TABS: 5.0000 mg | ORAL_TABLET | Freq: Three times a day (TID) | ORAL | Status: DC | PRN

## 2023-04-16 MED ORDER — IBUPROFEN 200 MG PO TABS
400.0000 mg | ORAL_TABLET | Freq: Four times a day (QID) | ORAL | Status: DC | PRN
  Administered 2023-04-17: 400 mg via ORAL
  Filled 2023-04-16: qty 2

## 2023-04-16 MED ORDER — PRAZOSIN HCL 1 MG PO CAPS
2.0000 mg | ORAL_CAPSULE | Freq: Every day | ORAL | Status: DC
  Administered 2023-04-16: 2 mg via ORAL
  Filled 2023-04-16: qty 2

## 2023-04-16 MED ORDER — ALBUTEROL SULFATE (2.5 MG/3ML) 0.083% IN NEBU: 2.5000 mg | INHALATION_SOLUTION | Freq: Four times a day (QID) | RESPIRATORY_TRACT | Status: DC | PRN

## 2023-04-16 MED ORDER — GABAPENTIN 300 MG PO CAPS
600.0000 mg | ORAL_CAPSULE | Freq: Three times a day (TID) | ORAL | Status: DC
  Administered 2023-04-16 – 2023-04-17 (×4): 600 mg via ORAL
  Filled 2023-04-16 (×4): qty 2

## 2023-04-16 MED ORDER — RISPERIDONE 2 MG PO TABS
2.0000 mg | ORAL_TABLET | Freq: Two times a day (BID) | ORAL | Status: DC
  Administered 2023-04-16: 2 mg via ORAL
  Filled 2023-04-16: qty 1

## 2023-04-16 MED ORDER — RISPERIDONE 2 MG PO TABS
3.0000 mg | ORAL_TABLET | Freq: Two times a day (BID) | ORAL | Status: DC
  Administered 2023-04-16 – 2023-04-17 (×2): 3 mg via ORAL
  Filled 2023-04-16 (×2): qty 1

## 2023-04-16 NOTE — ED Notes (Signed)
 Refused vitals again.

## 2023-04-16 NOTE — BH Assessment (Addendum)
 Per TTS Consult Secure Chat  2205 Per Juliette Alcide EMT, "ok she's sleep and refusing everything when I attempt to wake her up, but we can try if you like"  2205 Per [name], [facility] Coordinator "No problem reach back out to [name] when she is ready to be seen we will leave her on our list for now".   7829 Per Marcy Salvo, [facility]IS Coordinator "Because of our coverage hours we will be deferring [name] to your in-house provider in the AM".

## 2023-04-16 NOTE — ED Notes (Signed)
Patient medication compliant this afternoon.

## 2023-04-16 NOTE — Consult Note (Signed)
 [facility] Psychiatric Consult Initial  Patient Name: .[name]  MRN: [id number]  DOB: [birth year]  Consult Order details:  Orders (From admission, onward)     Start     Ordered   04/15/23 1255  CONSULT TO CALL ACT TEAM       Ordering Provider: Bethann Berkshire, MD  Provider:  (Not yet assigned)  Question:  Reason for Consult?  Answer:  delusional thoughts,   someone is out to kill her   04/15/23 1254             Mode of Visit: In person    Psychiatry Consult Evaluation  Service Date: April 16, 2023 LOS:  LOS: 0 days  Chief Complaint Unknown, patient is bizarre behavior, yelling,   Primary Psychiatric Diagnoses  Bipolar disorder, Manic  Assessment  [name] is a 33 y.o. female admitted: Presented to the EDfor 04/15/2023 12:05 PM for bizarre behavior found on the floor on the side of the road. She carries the psychiatric diagnoses of  Borderline Personality disorder, Bipolar disorder, Depressed with Psychotic  features, suicide attempts, GAD, PTSD and,  and has a past medical history of  Seizure, disorder, Migraine, Asthma and Fibromyalgia. .   Her current presentation of bizarre behavior, yelling, lying on the floor  is most consistent with Manic symptoms. She meets criteria for inpatient Psychiatry hospitalization based on impaired mental status, confusion and cognitive impairment.  Current outpatient psychotropic medications include Prazosin, Risperidone, Cymbalta and historically she has had a non response to these medications. She was not compliant with medications prior to admission as evidenced by her manic symptoms and behavior. On initial examination, patient manic, throwing herself on the floor and refusing assessment for 24 hrs.. Please see plan below for detailed recommendations.   Diagnoses:  Active Hospital problems: Principal Problem:   Bipolar affective disorder, currently manic, moderate (HCC) Active Problems:   Borderline personality  disorder (HCC)    Plan   ## Psychiatric Medication Recommendations:  Cymbalta 90 mg po daily for depression Benztropine 1 mg twice a day for Medication induced EPS Risperidone 2 mg po bid for Psychosis Prazosin 2 mg po at bed time for Nightmares ## Medical Decision Making Capacity:  she is her own guardian, makes her own decisions  ## Further Work-up:  --   -- most recent EKG on 03/10-REFUSED -- Pertinent labwork reviewed earlier this admission includes: CMP, CBC,    ## Disposition:-- We recommend inpatient psychiatric hospitalization after medical hospitalization. Patient has been involuntarily committed on 04/15/2023.   ## Behavioral / Environmental: -Delirium Precautions: Delirium Interventions for Nursing and Staff: - RN to open blinds every AM. - To Bedside: Glasses, hearing aide, and pt's own shoes. Make available to patients. when possible and encourage use. - Encourage po fluids when appropriate, keep fluids within reach. - OOB to chair with meals. - Passive ROM exercises to all extremities with AM & PM care. - RN to assess orientation to person, time and place QAM and PRN. - Recommend extended visitation hours with familiar family/friends as feasible. - Staff to minimize disturbances at night. Turn off television when pt asleep or when not in use., Difficult Patient (SELECT OPTIONS FROM BELOW), To minimize splitting of staff, assign one staff person to communicate all information from the team when feasible., or Utilize compassion and acknowledge the patient's experiences while setting clear and realistic expectations for care.    ## Safety and Observation Level:  - Based on my clinical evaluation, I estimate  the patient to be at [facility] risk of self harm in the current setting. - At this time, we recommend  routine. This decision is based on my review of the chart including patient's history and current presentation, interview of the patient, mental status examination, and  consideration of suicide risk including evaluating suicidal ideation, plan, intent, suicidal or self-harm behaviors, risk factors, and protective factors. This judgment is based on our ability to directly address suicide risk, implement suicide prevention strategies, and develop a safety plan while the patient is in the clinical setting. Please contact our team if there is a concern that risk level has changed.  CSSR Risk Category:C-SSRS RISK CATEGORY: No Risk  Suicide Risk Assessment: Patient has following modifiable risk factors for suicide: untreated depression, under treated depression , recklessness, and medication noncompliance, which we are addressing by recommending inpatient Psychiatry hospitalization.. Patient has following non-modifiable or demographic risk factors for suicide: history of suicide attempt, history of self harm behavior, and psychiatric hospitalization Patient has the following protective factors against suicide: NA  Thank you for this consult request. Recommendations have been communicated to the primary team.  We will Continue to round on patient daily until admitted. at this time.   Earney Navy, NP-PMHNP-BC       History of Present Illness  Relevant Aspects of Hospital ED Course:  Admitted on 04/15/2023 for bizarre behavior found on the floor on the side of the road.   Patient, 33 years old well known to [facility] Psychiatry program was brought in yesterday by EMS after they were called to come see a lady on the floor on the side of the street.  Patient was brought in yelling, crying and manic.  She She reported that she was brought in by her mother who is not happy she got married four days ago. She pointed at room 17 stating her husband is there put in for deportation by her mother. She pointed at room 18 stating her step son is in there and that her mother is planning to kill all of them. she was just given Geodon as she was refusing V/S, Lab and did not allow staff  help her.   Today all morning she has been yelling off and on.  She lays down crying in from of the Nursing station.  Three attempts by this provider to evaluate patient failed.  Patient will eat and go back to sleep or come out from her room and lay down in front of the Nursing station.  She disheveled, unkempt and appears she has not been taking her Medications.  She left Sunday night AMA from [facility] ER.  Patient was hospitalized two weeks ago at [facility] and since discharge she was seen at [facility] for Auditory hallucination.  Patient has had Multiple Inpatient Psychiatry hospitalizations and has ACT TEAM but is not compliant with her Medications.  Patient's ACT Team is [facility] and calls to them are not returned.  Based on her presentation of cognitive impairment, confusion and erratic behavior we will admit patient to inpatient psychiatry care unit.. Home Medications are resumed.   Psych ROS:  Depression: uta Anxiety:  yes Mania (lifetime and current): yes Psychosis: (lifetime and current) uta  Collateral information:  Contacted Risher [name] at [phone] on 04/15/2023-Father who said he and his ex wife does not know anything about Judy and that the hospital should not believe what she says and to not contact him or Mariacristina's mother again.  Review of Systems  Reason unable to perform ROS: unable to obtain, patient is too manic to engage.     Psychiatric and Social History  Psychiatric History:  Information collected from Review of chart  Prev Dx/Sx: see above Current Psych Provider: Sallyanne Kuster of life ACT Team Home Meds (current): see above Previous Med Trials: Haldol, Vrylar, Aripirazole, Risperidone,  Therapy: yes   Prior Psych Hospitalization: Multiple times  Prior Self Harm: yes Prior Violence: yes   Family Psych History: Yes Family Hx suicide: denies   Social History:  Developmental Hx: Asperger's disease,  Educational Hx: Associate two  years degree Occupational Hx:  none, unemployed Armed forces operational officer Hx: denies Living Situation: apartment Spiritual Hx: denies Access to weapons/lethal means: no    Substance History Alcohol: Denies  Type of alcohol na Last Drink na Number of drinks per day denies History of alcohol withdrawal seizures denies History of DT's denies Tobacco: denies Illicit drugs: denies-UDS Negative Prescription drug abuse: denies Rehab hx: denies  Exam Findings  Physical Exam:  Vital Signs:  Temp:  [97.7 F (36.5 C)-98.6 F (37 C)] 98.6 F (37 C) (03/11 1056) Pulse Rate:  [86-118] 100 (03/11 1056) Resp:  [17-20] 17 (03/11 1056) BP: (114-134)/(53-63) 114/59 (03/11 1056) SpO2:  [97 %-100 %] 100 % (03/11 1056) Blood pressure (!) 114/59, pulse 100, temperature 98.6 F (37 C), temperature source Oral, resp. rate 17, SpO2 100%. There is no height or weight on file to calculate BMI.  Physical Exam Constitutional:      Appearance: She is obese.  Cardiovascular:     Rate and Rhythm: Normal rate and regular rhythm.  Pulmonary:     Effort: Pulmonary effort is normal.  Musculoskeletal:        General: Normal range of motion.     Cervical back: Normal range of motion.  Skin:    General: Skin is dry.  Neurological:     Mental Status: She is alert and oriented to person, place, and time.  Psychiatric:        Attention and Perception: She is inattentive.        Mood and Affect: Mood is anxious and depressed. Affect is labile, angry, tearful and inappropriate.        Speech: Speech is rapid and pressured and tangential.        Behavior: Behavior is uncooperative and agitated.        Cognition and Memory: Cognition is impaired. Memory is impaired.        Judgment: Judgment is impulsive and inappropriate.     Mental Status Exam: General Appearance: Casual, Disheveled, and obese  Orientation:  Other:  oriented to name and place  Memory:  Immediate;   Poor Recent;   Poor Remote;   Poor  Concentration:  Concentration: Poor and Attention  Span: Poor  Recall:  Poor  Attention  Poor  Eye Contact:  Minimal  Speech:   refusing to interact with staff.  Yells out occasionally  Language:   refusing to speak with providers.  Volume:   yells out crying intermittently  Mood: unable to state mood, but angry, yells out.  Affect:  Labile, Full Range, and Tearful  Thought Process:  Disorganized  Thought Content:  Tangential and not interested in speaking to staff  Suicidal Thoughts:   uta  Homicidal Thoughts:   uta  Judgement:  Other:  uta  Insight:   uta  Psychomotor Activity:  Increased, Restlessness, and restless lays down on the floor tossing and rolling about   Akathisia:  NA  Fund of Knowledge:   uta      Assets:  Others:  uta  Cognition:  Impaired,  Moderate  ADL's:  Impaired  AIMS (if indicated):        Other History   These have been pulled in through the EMR, reviewed, and updated if appropriate.  Family History:  The patient's family history includes Asthma in her father; Mental illness in her father; PDD in her brother; Seizures in her brother.  Medical History: Past Medical History:  Diagnosis Date  . Acid reflux   . Adjustment disorder with mixed anxiety and depressed mood 01/31/2022  . Adjustment disorder with mixed disturbance of emotions and conduct 08/03/2019  . Anxiety   . Asthma    last attack 03/13/15 or 03/14/15  . Autism   . Bipolar 1 disorder, depressed, severe (HCC) 07/25/2021  . 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 Chronic constipation   . Depression   . Drug-seeking behavior   . Essential tremor   . Headache   . Ineffective individual coping 05/16/2022  . Insomnia 01/12/2022  . Intentional drug overdose (HCC) 06/05/2022  . Neuromuscular disorder (HCC)   . Normocytic anemia 06/05/2022  . Overdose 07/22/2017  . Overdose of acetaminophen 07/2017   and other meds  . Overdose, intentional self-harm, initial encounter (HCC) 07/20/2021  . Paranoia (HCC) 04/22/2021  . Paroxysmal atrial  fibrillation (HCC)   . Personality disorder (HCC)   . Prolonged QT interval   . Purposeful non-suicidal drug ingestion (HCC) 06/27/2021  . Schizo-affective psychosis (HCC)   . Schizoaffective disorder (HCC) 07/29/2022  . Schizoaffective disorder, bipolar type (HCC)   . Seizures [facility])    Last seizure December 2017  . Skin erythema 04/27/2022  . Sleep apnea   . Suicidal behavior 07/25/2021  . Suicidal ideation   . Suicide (HCC) 07/01/2021  . Suicide attempt (HCC) 07/04/2021    Surgical History: Past Surgical History:  Procedure Laterality Date  . MOUTH SURGERY  2009 or 2010     Medications:   Current Facility-Administered Medications:  .  albuterol (PROVENTIL) (2.5 MG/3ML) 0.083% nebulizer solution 2.5 mg, 2.5 mg, Nebulization, Q6H PRN, Gloris Manchester, MD .  benztropine (COGENTIN) tablet 1 mg, 1 mg, Oral, Daily, Gloris Manchester, MD .  cyclobenzaprine (FLEXERIL) tablet 10 mg, 10 mg, Oral, QHS, Dixon, Ryan, MD .  DULoxetine (CYMBALTA) DR capsule 90 mg, 90 mg, Oral, QHS, Dixon, Ryan, MD .  gabapentin (NEURONTIN) capsule 600 mg, 600 mg, Oral, TID, Gloris Manchester, MD .  hydrOXYzine (ATARAX) tablet 25 mg, 25 mg, Oral, TID PRN, Gloris Manchester, MD .  ibuprofen (ADVIL) tablet 400 mg, 400 mg, Oral, Q6H PRN, Gloris Manchester, MD .  lip balm (CARMEX) ointment, , Topical, Once, Bethann Berkshire, MD .  metoprolol tartrate (LOPRESSOR) tablet 25 mg, 25 mg, Oral, BID, Gloris Manchester, MD .  OLANZapine (ZYPREXA) tablet 5 mg, 5 mg, Oral, TID PRN, Gloris Manchester, MD .  prazosin (MINIPRESS) capsule 2 mg, 2 mg, Oral, QHS, Dixon, Ryan, MD .  risperiDONE (RISPERDAL) tablet 2 mg, 2 mg, Oral, BID, Gloris Manchester, MD  Current Outpatient Medications:  .  albuterol (PROVENTIL) (2.5 MG/3ML) 0.083% nebulizer solution, Take 3 mLs (2.5 mg total) by nebulization every 6 (six) hours as needed for wheezing or shortness of breath., Disp: 75 mL, Rfl: 0 .  albuterol (VENTOLIN HFA) 108 (90 Base) MCG/ACT inhaler, Inhale 2 puffs into the lungs  every 6 (six) hours as needed for wheezing or shortness of breath.,  Disp: , Rfl:  .  benzonatate (TESSALON) 100 MG capsule, Take 1 capsule (100 mg total) by mouth 3 (three) times daily as needed for cough., Disp: , Rfl:  .  benztropine (COGENTIN) 1 MG tablet, Take 1 mg by mouth daily., Disp: , Rfl:  .  cyclobenzaprine (FLEXERIL) 10 MG tablet, Take 1 tablet (10 mg total) by mouth at bedtime., Disp: , Rfl:  .  DULoxetine (CYMBALTA) 30 MG capsule, Take 3 capsules (90 mg total) by mouth at bedtime., Disp: , Rfl:  .  gabapentin (NEURONTIN) 300 MG capsule, Take 2 capsules (600 mg total) by mouth 3 (three) times daily., Disp: , Rfl:  .  guaiFENesin (MUCINEX) 600 MG 12 hr tablet, Take 1 tablet (600 mg total) by mouth 2 (two) times daily., Disp: , Rfl:  .  hydrOXYzine (ATARAX) 25 MG tablet, Take 1 tablet (25 mg total) by mouth 3 (three) times daily as needed for anxiety., Disp: 30 tablet, Rfl: 0 .  ibuprofen (ADVIL) 200 MG tablet, Take 400 mg by mouth every 6 (six) hours as needed for headache., Disp: , Rfl:  .  losartan (COZAAR) 100 MG tablet, Take 100 mg by mouth daily., Disp: , Rfl:  .  metFORMIN (GLUCOPHAGE) 500 MG tablet, Take 1 tablet (500 mg total) by mouth daily with breakfast., Disp: , Rfl:  .  metoprolol tartrate (LOPRESSOR) 25 MG tablet, Take 25 mg by mouth 2 (two) times daily., Disp: , Rfl:  .  OLANZapine (ZYPREXA) 5 MG tablet, Take 1 tablet (5 mg total) by mouth 3 (three) times daily as needed (significant agitation w/ threat of harm to self or others. Ordered TID not q8 OK to give early. Will adjust dosing tomorrow.)., Disp: , Rfl:  .  pantoprazole (PROTONIX) 40 MG tablet, Take 1 tablet (40 mg total) by mouth daily before breakfast., Disp: 30 tablet, Rfl: 0 .  prazosin (MINIPRESS) 2 MG capsule, Take 2 mg by mouth at bedtime., Disp: , Rfl:  .  propafenone (RYTHMOL SR) 425 MG 12 hr capsule, Take 425 mg by mouth 2 (two) times daily., Disp: , Rfl:  .  risperiDONE (RISPERDAL) 2 MG tablet, Take 1  tablet (2 mg total) by mouth 2 (two) times daily., Disp: , Rfl:  .  SYMBICORT 160-4.5 MCG/ACT inhaler, Inhale 2 puffs into the lungs in the morning and at bedtime., Disp: , Rfl:  .  UZEDY 125 MG/0.35ML SUSY, Inject 0.35 mLs (125 mg total) into the skin every 30 (thirty) days., Disp: , Rfl:   Allergies: Allergies  Allergen Reactions  . Bee Venom Anaphylaxis  . Coconut Flavoring Agent (Non-Screening) Anaphylaxis and Rash  . Fish Allergy Anaphylaxis  . Geodon [Ziprasidone Hcl] Other (See Comments)    Pt states that this medication causes paralysis of the mouth.    . Haloperidol And Related Other (See Comments)    Pt states that this medication causes paralysis of the mouth, jaw locks up  . Lithobid [Lithium] Other (See Comments)    Seizure-like activity   . Roxicodone [Oxycodone] Other (See Comments)    Hallucinations   . Seroquel [Quetiapine] Other (See Comments)    Severe drowsiness   . Shellfish Allergy Anaphylaxis  . Phenergan [Promethazine Hcl] Other (See Comments)    Chest pain    . Prilosec [Omeprazole] Nausea And Vomiting and Other (See Comments)    Pt can take protonix with no problems   . Sulfa Antibiotics Other (See Comments)    Chest pain   . Tegretol [Carbamazepine] Nausea  And Vomiting  . Prozac [Fluoxetine] Other (See Comments)    Increased Depression and Suicidal thoughts  . Tape Other (See Comments)    Skin tears, can only tolerate paper tape.  . Tylenol [Acetaminophen] Rash and Other (See Comments)    Rash on face     Earney Navy, NP-PMHNP-NP

## 2023-04-16 NOTE — Progress Notes (Signed)
 LCSW Progress Note  [id number]   [name]  04/16/2023  3:26 PM  Description:   Inpatient Psychiatric Referral  Patient was recommended inpatient per Dahlia Byes NP.There are no available beds at [facility], per [facility] Rona Ravens RN. Patient was referred to the following out of network facilities:   Destination    Service Provider Address Phone Fax  [facility] [address], [place] [address] [phone]  [facility] [address] [place] [address] [phone]  [facility] [address], [name] [address] [phone]  [facility] [address], [place] [address] [phone]  [facility] [address] [place] [address] [phone]  [facility] [address] [place], [place] [phone]  [facility] [address] [place], [place] [address] [phone]  [facility] [address] [place] [address] [phone]  [facility] [address] [place] [address] [phone]      Situation ongoing, CSW to continue following and update chart as more information becomes available.      [place] Shellee Streng MSW, LCSW  04/16/2023 3:26 PM

## 2023-04-16 NOTE — ED Notes (Signed)
 Patient was lying on floor.  Calling out randomly, yelling at times, tearful.  Patient delusional, thinks being killed by her mom. Worried about family and children.

## 2023-04-16 NOTE — ED Provider Notes (Signed)
 Emergency Medicine Observation Re-evaluation Note  [name] is a 33 y.o. female, seen on rounds today.  Pt initially presented to the ED for complaints of Shortness of Breath Currently, the patient is sleeping.  Physical Exam  BP 134/63 (BP Location: Left Arm)   Pulse 86   Temp 97.7 F (36.5 C) (Oral)   Resp 20   LMP  (LMP Unknown)   SpO2 100%  Physical Exam General: Sleeping Cardiac: Extremities well-perfused Lungs: Breathing is unlabored Psych: Deferred  ED Course / MDM  EKG:   I have reviewed the labs performed to date as well as medications administered while in observation.  Recent changes in the last 24 hours include presentation to the emergency department yesterday with visual hallucinations and paranoid delusions.  Awaiting TTS evaluation.  Plan  Current plan is for TTS evaluation.    Gloris Manchester, MD 04/16/23 [phone]

## 2023-04-16 NOTE — ED Notes (Signed)
 Patient accepted to [facility] Tomorrow 04/16/2023 after 8 AM Accepting Dr Sofie Hartigan    Report # [phone]  (leave message and they will call back)

## 2023-04-17 DIAGNOSIS — R0602 Shortness of breath: Secondary | ICD-10-CM | POA: Diagnosis not present

## 2023-04-17 NOTE — ED Notes (Signed)
 Called [facility] and left voicemail to call back for report.

## 2023-04-17 NOTE — ED Notes (Signed)
 Pt calm and cooperative with staff throughout the night, allowed staff to perform vitals, medication compliant, sleeping all night, pt had no complaints. No aggression noted.

## 2023-04-17 NOTE — ED Notes (Signed)
 Report given to Nurse Varney Biles at [facility]

## 2023-04-17 NOTE — ED Notes (Signed)
 Contacted [facility] for transport. Spoke with International aid/development worker. Will call back with ETA when available.

## 2023-04-17 NOTE — ED Provider Notes (Signed)
 Emergency Medicine Observation Re-evaluation Note  [name] is a 33 y.o. female, seen on rounds today.  Pt initially presented to the ED for complaints of Shortness of Breath Currently, the patient is asleep, no new concerns by nursing staff.  Physical Exam  BP 110/80 (BP Location: Right Arm)   Pulse 87   Temp 97.7 F (36.5 C) (Oral)   Resp 16   LMP  (LMP Unknown)   SpO2 98%  Physical Exam General: Asleep, no acute distress Cardiac: Regular rate Lungs: No increased WOB Psych: Calm, asleep  ED Course / MDM  EKG:   I have reviewed the labs performed to date as well as medications administered while in observation.  Recent changes in the last 24 hours include patient remains medically cleared. Evaluated by psych and recommended for inpatient. Accepted to [facility] today by Dr. Sofie Hartigan.  Plan  Current plan is for admission to [facility] today.    Elayne Snare K, DO 04/17/23 0800

## 2023-04-17 NOTE — ED Notes (Addendum)
 Called [facility] back for an update on transferring patient. Officer Graves advised they will be calling shortly with an ETA.

## 2023-04-22 ENCOUNTER — Telehealth: Payer: Self-pay | Admitting: Family Medicine

## 2023-04-22 NOTE — Telephone Encounter (Signed)
 I reached out to the patient to get her scheduled for her annual appt. No answer, Message left.

## 2023-04-25 ENCOUNTER — Other Ambulatory Visit: Payer: Self-pay

## 2023-04-25 ENCOUNTER — Emergency Department (HOSPITAL_COMMUNITY)
Admission: EM | Admit: 2023-04-25 | Discharge: 2023-04-26 | Disposition: A | Discharge status: Home / Self Care | Attending: Emergency Medicine | Admitting: Emergency Medicine

## 2023-04-25 DIAGNOSIS — F23 Brief psychotic disorder: Secondary | ICD-10-CM | POA: Diagnosis not present

## 2023-04-25 DIAGNOSIS — F603 Borderline personality disorder: Secondary | ICD-10-CM | POA: Diagnosis not present

## 2023-04-25 DIAGNOSIS — R443 Hallucinations, unspecified: Secondary | ICD-10-CM | POA: Diagnosis present

## 2023-04-25 DIAGNOSIS — F22 Delusional disorders: Secondary | ICD-10-CM | POA: Diagnosis not present

## 2023-04-25 DIAGNOSIS — F4310 Post-traumatic stress disorder, unspecified: Secondary | ICD-10-CM | POA: Diagnosis not present

## 2023-04-25 DIAGNOSIS — F250 Schizoaffective disorder, bipolar type: Secondary | ICD-10-CM | POA: Insufficient documentation

## 2023-04-25 DIAGNOSIS — J45909 Unspecified asthma, uncomplicated: Secondary | ICD-10-CM | POA: Diagnosis not present

## 2023-04-25 DIAGNOSIS — R440 Auditory hallucinations: Secondary | ICD-10-CM | POA: Diagnosis present

## 2023-04-25 DIAGNOSIS — F840 Autistic disorder: Secondary | ICD-10-CM | POA: Diagnosis not present

## 2023-04-25 NOTE — ED Provider Notes (Signed)
 [place] EMERGENCY DEPARTMENT AT [facility] Provider Note   CSN: [id number] Arrival date & time: 04/25/23  2316     History {Add pertinent medical, surgical, social history, OB history to HPI:1} Chief Complaint  Patient presents with   Psychiatric Evaluation    Per EMS, pt was ath [facility] last week and was discharged home. Pt friend stated pt is having hallucinations. Pt states she is pregnant with 3 kids and she thinks her lungs are collapsing . Pt cooperative.     [name] is a 33 y.o. female.  The history is provided by the patient and medical records.   33 year old female with history of anemia, asked Buerger's, bipolar disorder, anxiety, GERD, paroxysmal A-fib, PTSD, schizoaffective disorder, presenting to the ED for psychiatric evaluation.  Patient reports "it has been along 3 weeks".  Reports recent admission to [facility] because her mother had her "committed".  She states while she was there she was told she was pregnant with triplets and her left lung was collapsed.  States she feels like she might be in labor right now.  She denies chest pain or SOB.    Home Medications Prior to Admission medications   Medication Sig Start Date End Date Taking? Authorizing Provider  albuterol (PROVENTIL) (2.5 MG/3ML) 0.083% nebulizer solution Take 3 mLs (2.5 mg total) by nebulization every 6 (six) hours as needed for wheezing or shortness of breath. 04/09/23   Ardis Hughs, NP  albuterol (VENTOLIN HFA) 108 (90 Base) MCG/ACT inhaler Inhale 2 puffs into the lungs every 6 (six) hours as needed for wheezing or shortness of breath. 04/09/23   Ardis Hughs, NP  benzonatate (TESSALON) 100 MG capsule Take 1 capsule (100 mg total) by mouth 3 (three) times daily as needed for cough. Patient not taking: Reported on 04/16/2023 04/09/23   Ardis Hughs, NP  benztropine (COGENTIN) 1 MG tablet Take 1 mg by mouth daily. 03/21/23   [provider]  cyclobenzaprine  (FLEXERIL) 10 MG tablet Take 1 tablet (10 mg total) by mouth at bedtime. 04/09/23   Ardis Hughs, NP  diclofenac Sodium (VOLTAREN) 1 % GEL Apply 2 g topically 4 (four) times daily as needed (for pain- affected areas).    [provider]  DULoxetine (CYMBALTA) 30 MG capsule Take 3 capsules (90 mg total) by mouth at bedtime. Patient not taking: Reported on 04/16/2023 04/09/23   Ardis Hughs, NP  DULoxetine (CYMBALTA) 60 MG capsule Take 60 mg by mouth daily.    [provider]  gabapentin (NEURONTIN) 300 MG capsule Take 2 capsules (600 mg total) by mouth 3 (three) times daily. Patient not taking: Reported on 04/16/2023 04/09/23   Ardis Hughs, NP  gabapentin (NEURONTIN) 600 MG tablet Take 600 mg by mouth 3 (three) times daily.    [provider]  guaiFENesin (MUCINEX) 600 MG 12 hr tablet Take 1 tablet (600 mg total) by mouth 2 (two) times daily. Patient not taking: Reported on 04/16/2023 04/09/23   Ardis Hughs, NP  hydrOXYzine (ATARAX) 25 MG tablet Take 1 tablet (25 mg total) by mouth 3 (three) times daily as needed for anxiety. Patient taking differently: Take 25 mg by mouth every 6 (six) hours as needed for anxiety. 04/09/23   Ardis Hughs, NP  ibuprofen (ADVIL) 200 MG tablet Take 400 mg by mouth every 6 (six) hours as needed for headache.    [provider]  metFORMIN (GLUCOPHAGE) 500 MG tablet Take 1  tablet (500 mg total) by mouth daily with breakfast. Patient not taking: Reported on 04/16/2023 04/10/23   Ardis Hughs, NP  metoprolol tartrate (LOPRESSOR) 25 MG tablet Take 25 mg by mouth 2 (two) times daily. 03/08/23   [provider]  OLANZapine (ZYPREXA) 5 MG tablet Take 1 tablet (5 mg total) by mouth 3 (three) times daily as needed (significant agitation w/ threat of harm to self or others. Ordered TID not q8 OK to give early. Will adjust dosing tomorrow.). Patient taking differently: Take 5 mg by mouth in the morning and at  bedtime. 04/09/23   Ardis Hughs, NP  ORAJEL 2X TOOTHACHE & GUM 20-0.26 % GEL Use as directed 1 application  in the mouth or throat as needed (for dental pain).    [provider]  pantoprazole (PROTONIX) 40 MG tablet Take 1 tablet (40 mg total) by mouth daily before breakfast. 01/15/23 04/16/23  Starleen Blue, NP  prazosin (MINIPRESS) 2 MG capsule Take 2 mg by mouth at bedtime. 02/13/23   [provider]  propafenone (RYTHMOL SR) 425 MG 12 hr capsule Take 425 mg by mouth in the morning and at bedtime. 03/18/23   [provider]  risperiDONE (RISPERDAL) 2 MG tablet Take 1 tablet (2 mg total) by mouth 2 (two) times daily. Patient not taking: Reported on 04/16/2023 04/09/23   Ardis Hughs, NP  risperiDONE (RISPERDAL) 3 MG tablet Take 3 mg by mouth in the morning and at bedtime.    [provider]  Skin Protectants, Misc. (CARMEX COMFORT CARE LIP BALM EX) Apply 1 application  topically 4 (four) times daily as needed (for dryness- to lips).    [provider]  SYMBICORT 160-4.5 MCG/ACT inhaler Inhale 2 puffs into the lungs in the morning and at bedtime. 01/24/23   [provider]  traZODone (DESYREL) 100 MG tablet Take 100 mg by mouth at bedtime as needed for sleep.    [provider]  UZEDY 125 MG/0.35ML SUSY Inject 0.35 mLs (125 mg total) into the skin every 30 (thirty) days. Patient not taking: Reported on 04/16/2023 04/09/23   Ardis Hughs, NP      Allergies    Bee venom, Coconut flavoring agent (non-screening), Fish allergy, Geodon [ziprasidone hcl], Haloperidol and related, Lithobid [lithium], Roxicodone [oxycodone], Seroquel [quetiapine], Shellfish allergy, Phenergan [promethazine hcl], Prilosec [omeprazole], Sulfa antibiotics, Tegretol [carbamazepine], Prozac [fluoxetine], Tape, and Tylenol [acetaminophen]    Review of Systems   Review of Systems  Psychiatric/Behavioral:  Positive for hallucinations.   All other systems  reviewed and are negative.   Physical Exam Updated Vital Signs BP (!) 147/68   Pulse 86   Temp 98.2 F (36.8 C) (Oral)   Resp 16   LMP  (LMP Unknown)   SpO2 99%   Physical Exam Vitals and nursing note reviewed.  Constitutional:      Appearance: She is well-developed.  HENT:     Head: Normocephalic and atraumatic.  Eyes:     Conjunctiva/sclera: Conjunctivae normal.     Pupils: Pupils are equal, round, and reactive to light.  Cardiovascular:     Rate and Rhythm: Normal rate and regular rhythm.     Heart sounds: Normal heart sounds.  Pulmonary:     Effort: Pulmonary effort is normal.     Breath sounds: Normal breath sounds.  Abdominal:     General: Bowel sounds are normal.     Palpations: Abdomen is soft.  Musculoskeletal:  General: Normal range of motion.     Cervical back: Normal range of motion.  Skin:    General: Skin is warm and dry.  Neurological:     Mental Status: She is alert and oriented to person, place, and time.  Psychiatric:     Comments: Delusional thoughts present Denies SI/HI     ED Results / Procedures / Treatments   Labs (all labs ordered are listed, but only abnormal results are displayed) Labs Reviewed  CBC WITH DIFFERENTIAL/PLATELET  COMPREHENSIVE METABOLIC PANEL  ETHANOL  RAPID URINE DRUG SCREEN, HOSP PERFORMED  SALICYLATE LEVEL  ACETAMINOPHEN LEVEL  HCG, SERUM, QUALITATIVE    EKG None  Radiology No results found.  Procedures Procedures  {Document cardiac monitor, telemetry assessment procedure when appropriate:1}  Medications Ordered in ED Medications - No data to display  ED Course/ Medical Decision Making/ A&P   {   Click here for ABCD2, HEART and other calculatorsREFRESH Note before signing :1}                              Medical Decision Making Amount and/or Complexity of Data Reviewed Labs: ordered.   ***  {Document critical care time when appropriate:1} {Document review of labs and clinical decision  tools ie heart score, Chads2Vasc2 etc:1}  {Document your independent review of radiology images, and any outside records:1} {Document your discussion with family members, caretakers, and with consultants:1} {Document social determinants of health affecting pt's care:1} {Document your decision making why or why not admission, treatments were needed:1} Final Clinical Impression(s) / ED Diagnoses Final diagnoses:  None    Rx / DC Orders ED Discharge Orders     None

## 2023-04-26 ENCOUNTER — Encounter (HOSPITAL_COMMUNITY): Payer: Self-pay

## 2023-04-26 ENCOUNTER — Other Ambulatory Visit: Payer: Self-pay

## 2023-04-26 ENCOUNTER — Emergency Department (HOSPITAL_COMMUNITY)
Admission: EM | Admit: 2023-04-26 | Discharge: 2023-04-27 | Disposition: A | Discharge status: Home / Self Care | Attending: Emergency Medicine | Admitting: Emergency Medicine

## 2023-04-26 DIAGNOSIS — F250 Schizoaffective disorder, bipolar type: Secondary | ICD-10-CM | POA: Insufficient documentation

## 2023-04-26 DIAGNOSIS — F4310 Post-traumatic stress disorder, unspecified: Secondary | ICD-10-CM | POA: Insufficient documentation

## 2023-04-26 DIAGNOSIS — J45909 Unspecified asthma, uncomplicated: Secondary | ICD-10-CM | POA: Insufficient documentation

## 2023-04-26 DIAGNOSIS — R440 Auditory hallucinations: Secondary | ICD-10-CM | POA: Diagnosis not present

## 2023-04-26 DIAGNOSIS — F603 Borderline personality disorder: Secondary | ICD-10-CM | POA: Diagnosis present

## 2023-04-26 DIAGNOSIS — F840 Autistic disorder: Secondary | ICD-10-CM | POA: Insufficient documentation

## 2023-04-26 DIAGNOSIS — F23 Brief psychotic disorder: Secondary | ICD-10-CM | POA: Insufficient documentation

## 2023-04-26 LAB — COMPREHENSIVE METABOLIC PANEL
ALT: 19 U/L (ref 0–44)
ALT: 21 U/L (ref 0–44)
AST: 18 U/L (ref 15–41)
AST: 20 U/L (ref 15–41)
Albumin: 3.5 g/dL (ref 3.5–5.0)
Albumin: 4 g/dL (ref 3.5–5.0)
Alkaline Phosphatase: 58 U/L (ref 38–126)
Alkaline Phosphatase: 62 U/L (ref 38–126)
Anion gap: 7 (ref 5–15)
Anion gap: 10 (ref 5–15)
BUN: 9 mg/dL (ref 6–20)
BUN: 8 mg/dL (ref 6–20)
CO2: 26 mmol/L (ref 22–32)
CO2: 23 mmol/L (ref 22–32)
Calcium: 9.1 mg/dL (ref 8.9–10.3)
Calcium: 9.2 mg/dL (ref 8.9–10.3)
Chloride: 106 mmol/L (ref 98–111)
Chloride: 104 mmol/L (ref 98–111)
Creatinine, Ser: 0.73 mg/dL (ref 0.44–1.00)
Creatinine, Ser: 0.72 mg/dL (ref 0.44–1.00)
GFR, Estimated: >60 mL/min (ref >60)
GFR, Estimated: >60 mL/min (ref >60)
Glucose, Bld: 103 mg/dL — ABNORMAL HIGH (ref 70–99)
Glucose, Bld: 96 mg/dL (ref 70–99)
Potassium: 3.6 mmol/L (ref 3.5–5.1)
Potassium: 3.6 mmol/L (ref 3.5–5.1)
Sodium: 139 mmol/L (ref 135–145)
Sodium: 137 mmol/L (ref 135–145)
Total Bilirubin: 0.3 mg/dL (ref 0.0–1.2)
Total Bilirubin: 0.5 mg/dL (ref 0.0–1.2)
Total Protein: 6.2 g/dL — ABNORMAL LOW (ref 6.5–8.1)
Total Protein: 7 g/dL (ref 6.5–8.1)

## 2023-04-26 LAB — CBC WITH DIFFERENTIAL/PLATELET
Abs Immature Granulocytes: 0.05 10*3/uL (ref 0.00–0.07)
Basophils Absolute: 0 10*3/uL (ref 0.0–0.1)
Basophils Relative: 0 %
Eosinophils Absolute: 0.1 10*3/uL (ref 0.0–0.5)
Eosinophils Relative: 1 %
HCT: 32.6 % — ABNORMAL LOW (ref 36.0–46.0)
Hemoglobin: 10.3 g/dL — ABNORMAL LOW (ref 12.0–15.0)
Immature Granulocytes: 1 %
Lymphocytes Relative: 24 %
Lymphs Abs: 2.4 10*3/uL (ref 0.7–4.0)
MCH: 25.1 pg — ABNORMAL LOW (ref 26.0–34.0)
MCHC: 31.6 g/dL (ref 30.0–36.0)
MCV: 79.3 fL — ABNORMAL LOW (ref 80.0–100.0)
Monocytes Absolute: 0.8 10*3/uL (ref 0.1–1.0)
Monocytes Relative: 8 %
Neutro Abs: 6.4 10*3/uL (ref 1.7–7.7)
Neutrophils Relative %: 66 %
Platelets: 238 10*3/uL (ref 150–400)
RBC: 4.11 MIL/uL (ref 3.87–5.11)
RDW: 15.2 % (ref 11.5–15.5)
WBC: 9.8 10*3/uL (ref 4.0–10.5)
nRBC: 0 % (ref 0.0–0.2)

## 2023-04-26 LAB — ETHANOL
Alcohol, Ethyl (B): <10 mg/dL (ref <10)
Alcohol, Ethyl (B): <10 mg/dL (ref <10)

## 2023-04-26 LAB — CBC
HCT: 36.7 % (ref 36.0–46.0)
Hemoglobin: 11.5 g/dL — ABNORMAL LOW (ref 12.0–15.0)
MCH: 24.9 pg — ABNORMAL LOW (ref 26.0–34.0)
MCHC: 31.3 g/dL (ref 30.0–36.0)
MCV: 79.6 fL — ABNORMAL LOW (ref 80.0–100.0)
Platelets: 277 10*3/uL (ref 150–400)
RBC: 4.61 MIL/uL (ref 3.87–5.11)
RDW: 15.3 % (ref 11.5–15.5)
WBC: 7.4 10*3/uL (ref 4.0–10.5)
nRBC: 0 % (ref 0.0–0.2)

## 2023-04-26 LAB — ACETAMINOPHEN LEVEL
Acetaminophen (Tylenol), Serum: <10 ug/mL — ABNORMAL LOW (ref 10–30)
Acetaminophen (Tylenol), Serum: <10 ug/mL — ABNORMAL LOW (ref 10–30)

## 2023-04-26 LAB — SALICYLATE LEVEL
Salicylate Lvl: <7 mg/dL — ABNORMAL LOW (ref 7.0–30.0)
Salicylate Lvl: <7 mg/dL — ABNORMAL LOW (ref 7.0–30.0)

## 2023-04-26 LAB — HCG, SERUM, QUALITATIVE
Preg, Serum: NEGATIVE
Preg, Serum: NEGATIVE

## 2023-04-26 MED ORDER — TRAZODONE HCL 50 MG PO TABS
100.0000 mg | ORAL_TABLET | Freq: Every day | ORAL | Status: DC
  Administered 2023-04-27: 100 mg via ORAL
  Filled 2023-04-26 (×2): qty 2

## 2023-04-26 MED ORDER — BENZTROPINE MESYLATE 1 MG PO TABS
1.0000 mg | ORAL_TABLET | Freq: Every day | ORAL | Status: DC
  Administered 2023-04-26: 1 mg via ORAL
  Filled 2023-04-26 (×2): qty 1

## 2023-04-26 MED ORDER — RISPERIDONE 2 MG PO TABS
2.0000 mg | ORAL_TABLET | Freq: Two times a day (BID) | ORAL | Status: DC
  Administered 2023-04-26 – 2023-04-27 (×2): 2 mg via ORAL
  Filled 2023-04-26 (×2): qty 1

## 2023-04-26 MED ORDER — DULOXETINE HCL 30 MG PO CPEP
60.0000 mg | ORAL_CAPSULE | Freq: Every day | ORAL | Status: DC
  Administered 2023-04-27: 60 mg via ORAL
  Filled 2023-04-26: qty 2

## 2023-04-26 MED ORDER — MOMETASONE FURO-FORMOTEROL FUM 200-5 MCG/ACT IN AERO
2.0000 | INHALATION_SPRAY | Freq: Two times a day (BID) | RESPIRATORY_TRACT | Status: DC
  Administered 2023-04-26: 2 via RESPIRATORY_TRACT
  Filled 2023-04-26: qty 8.8

## 2023-04-26 MED ORDER — ALBUTEROL SULFATE HFA 108 (90 BASE) MCG/ACT IN AERS: 2.0000 | INHALATION_SPRAY | Freq: Four times a day (QID) | RESPIRATORY_TRACT | Status: DC | PRN

## 2023-04-26 MED ORDER — METOPROLOL TARTRATE 25 MG PO TABS
25.0000 mg | ORAL_TABLET | Freq: Two times a day (BID) | ORAL | Status: DC
  Administered 2023-04-26 – 2023-04-27 (×2): 25 mg via ORAL
  Filled 2023-04-26 (×2): qty 1

## 2023-04-26 MED ORDER — OLANZAPINE 10 MG PO TABS
10.0000 mg | ORAL_TABLET | Freq: Every day | ORAL | Status: DC
  Administered 2023-04-26: 10 mg via ORAL
  Filled 2023-04-26: qty 1

## 2023-04-26 NOTE — Discharge Instructions (Addendum)
 Psychiatry team feels you are stable for discharge.  They want you to continue your home medications and follow-up with your psychiatrist. You can follow-up with your primary care doctor as well. Return here for new concerns.
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
## 2023-04-26 NOTE — BH Assessment (Signed)
@  2054, Patient was deferred to [facility] for a telepsych assessment.

## 2023-04-26 NOTE — Progress Notes (Signed)
 [name]
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
## 2023-04-26 NOTE — ED Notes (Signed)
 Pt belongings placed in locker 10 (2 bags)

## 2023-04-26 NOTE — ED Notes (Addendum)
 As soon as pt returned from TTS assessment pt requested this RN to bedside. Pt now states "I am suicidal with a plan now". When asked what her plan is pt states "to cut myself". MD made aware.

## 2023-04-27 DIAGNOSIS — F603 Borderline personality disorder: Secondary | ICD-10-CM

## 2023-04-27 DIAGNOSIS — F250 Schizoaffective disorder, bipolar type: Secondary | ICD-10-CM | POA: Diagnosis not present

## 2023-04-27 NOTE — Consult Note (Signed)
 [facility] Psychiatric Consult Follow-up  Patient Name: .[name]  MRN: [id number]  DOB: 1990-08-16  Consult Order details:  Orders (From admission, onward)     Start     Ordered   04/26/23 1304  CONSULT TO CALL ACT TEAM       Ordering Provider: Cathren Laine, MD  Provider:  (Not yet assigned)  Question:  Reason for Consult?  Answer:  Psych consult   04/26/23 1303             Mode of Visit: In person    Psychiatry Consult Evaluation  Service Date: April 27, 2023 LOS:  LOS: 0 days  Chief Complaint "I don't want to talk"  Primary Psychiatric Diagnoses  Borderline Personality Disorder   Assessment  [name] is a 33 y.o. female admitted: Presented to the EDfor 04/26/2023 11:47 AM for thoughts of suicidal ideation and complaint that her mom is against her. She carries the psychiatric diagnoses of schizoaffective disorder, borderline personality disorder, bipolar 1 disorder, GAD and asperger syndrome and has a past medical history of GERD, paroxysmal atrial fibrillation, asthma, intraventricular conduction delay and seizure disorder.   Her current presentation of disinterest in participating in assessment and choosing to ignore staff is most consistent with borderline personality disorder. She meets criteria for outpatient followup based on not being a risk to herself.  Current outpatient psychotropic medications include zyprexa, risperdal, and trazodone and historically she has had a positive response to these medications. She was compliant with medications prior to admission as evidenced by patient report. On initial examination, patient is uncooperative and asks to have [facility] "come pick me up". Please see plan below for detailed recommendations.   Diagnoses:  Active Hospital problems: Principal Problem:   Borderline personality disorder (HCC)    Plan   ## Psychiatric Medication Recommendations:  Restart home medications  ## Medical  Decision Making Capacity: Not specifically addressed in this encounter  ## Further Work-up:  -- most recent EKG on 04/15/2023 had QtC of 469; updated EKG pending -- Pertinent labwork reviewed earlier this admission includes: CBC, CMP, acetaminophen, salicylate pregnancy and alcohol; UDS is pending   ## Disposition:-- There are no psychiatric contraindications to discharge at this time  ## Behavioral / Environmental: -Utilize compassion and acknowledge the patient's experiences while setting clear and realistic expectations for care.    ## Safety and Observation Level:  - Based on my clinical evaluation, I estimate the patient to be at low risk of self harm in the current setting. - At this time, we recommend  routine. This decision is based on my review of the chart including patient's history and current presentation, interview of the patient, mental status examination, and consideration of suicide risk including evaluating suicidal ideation, plan, intent, suicidal or self-harm behaviors, risk factors, and protective factors. This judgment is based on our ability to directly address suicide risk, implement suicide prevention strategies, and develop a safety plan while the patient is in the clinical setting. Please contact our team if there is a concern that risk level has changed.  CSSR Risk Category:C-SSRS RISK CATEGORY: Error: Q7 should not be populated when Q6 is No  Suicide Risk Assessment: Patient has following modifiable risk factors for suicide: none, which we are addressing by recommending follow up with established outpatient provider. Patient has following non-modifiable or demographic risk factors for suicide: history of suicide attempt and psychiatric hospitalization Patient has the following protective factors against suicide: Access to outpatient mental health  care and Minor children in the home  Thank you for this consult request. Recommendations have been communicated to the  primary team.  We will sign off at this time.   Thomes Lolling, NP       History of Present Illness  Relevant Aspects of Hospital ED Course:  Admitted on 04/26/2023 for for thoughts of suicidal ideation and complaint that her mom is against her. She carries the psychiatric diagnoses of schizoaffective disorder, borderline personality disorder, bipolar 1 disorder, GAD and asperger syndrome and has a past medical history of GERD, paroxysmal atrial fibrillation, asthma, intraventricular conduction delay and seizure disorder.    Patient Report:  [name], is seen face to face by this provider, consulted with Dr. Enedina Finner; and chart reviewed on 04/27/23.  On evaluation [name] reports she wants someone to call [facility] called to give her a ride.  She is uncooperative with assessment and ignores staff.    Patient has had over 15 emergency room visits with similar presentation in the last 6 months.This patient has a significant history of malingering evident by multiple emergency room (ED), urgent care (UC) visits, and psychiatric hospitalizations with similar complaints of suicidal ideation.  Patient has been observed overnight in the ED and UC and there has been no unsafe behavior observed.  Patients' similar complaint of suicidal ideation is often resolved once housing or other need has been arranged for her.  While on unit she demonstrates no unsafe behavior and there is no psychiatric condition which is amendable to inpatient psychiatric hospitalization.  Patients' suicidal ideation is conditional and used as a means of manipulation and/or attention seeking without true intention to harm herself. Her behaviors are more consistent with someone who is acting in self-preservation, rather that someone who is acutely suicidal.  Given all that is stated above including the fact that the patient shows no signs of mania or psychosis, the patient does not meet criteria for inpatient  psychiatric hospitalization or further psychiatric evaluation at this time.  Patient would benefit from follow up with her established outpatient psychiatric provider.   During evaluation [name] is laying on stretcher in no acute distress. She is alert & oriented x 4 and calm.  Her mood is euthymic with congruent affect.  She has normal speech, and behavior.  Objectively there is no evidence of psychosis/mania or delusional thinking. Pt does not appear to be responding to internal or external stimuli.  Patient is able to converse coherently, goal directed thoughts, no distractibility, or pre-occupation.  She denies suicidal/self-harm/homicidal ideation, psychosis, and paranoia.      Psych ROS:  Depression: denies Anxiety:  denies Mania (lifetime and current): yes, previously Psychosis: (lifetime and current): yes, previously  Review of Systems  All other systems reviewed and are negative.    Psychiatric and Social History  Psychiatric History:  Information collected from patient and chart review  Prev Dx/Sx: schizoaffective disorder, borderline personality disorder, bipolar 1 disorder, GAD and asperger syndrome Current Psych Provider: [facility] in [facility] Meds (current): zyprexa, risperdal, and trazodone Previous Med Trials: haldol and geodon Therapy: [facility]  Prior Psych Hospitalization: yes, multiple  Prior Self Harm: yes Prior Violence: denies  Family Psych History: unknown Family Hx suicide: unknown  Social History:  Developmental Hx: unknown Educational Hx: inknown Occupational Hx: disability Legal Hx: denies Living Situation: lives with a friend Spiritual Hx: none  Access to weapons/lethal means: denies   Substance History  Alcohol: denies  Tobacco: not currently Illicit drugs: denies - No UDS to confirm; prior history of cocaine use Prescription drug abuse: denies - No UDS to confirm Rehab hx: denies  Exam Findings   Physical Exam:  Vital Signs:  Temp:  [97.4 F (36.3 C)-98 F (36.7 C)] 98 F (36.7 C) (03/22 0433) Pulse Rate:  [78-95] 91 (03/22 0433) Resp:  [16] 16 (03/22 0433) BP: (111-126)/(73-77) 126/76 (03/22 0433) SpO2:  [98 %-99 %] 99 % (03/22 0433) Weight:  [112.7 kg] 112.7 kg (03/21 1150) Blood pressure 126/76, pulse 91, temperature 98 F (36.7 C), resp. rate 16, height 5\' 5"  (1.651 m), weight 112.7 kg, SpO2 99%. Body mass index is 41.35 kg/m.  Physical Exam Vitals and nursing note reviewed.  Constitutional:      Appearance: She is obese.  Eyes:     Pupils: Pupils are equal, round, and reactive to light.  Pulmonary:     Effort: Pulmonary effort is normal.  Skin:    General: Skin is dry.  Neurological:     Mental Status: She is alert and oriented to person, place, and time.  Psychiatric:        Attention and Perception: Attention and perception normal.        Mood and Affect: Mood and affect normal.        Speech: Speech normal.        Behavior: Behavior is uncooperative.        Judgment: Judgment is impulsive.     Mental Status Exam: General Appearance: Disheveled  Orientation:  Full (Time, Place, and Person)  Memory:  Immediate;   Fair Recent;   Fair Remote;   Fair  Concentration:  Concentration: Fair  Recall:   unable to assess  Attention  Fair  Eye Contact:  Poor  Speech:  Clear and Coherent  Language:  Fair  Volume:  Normal  Mood: euthymic  Affect:  Congruent  Thought Process:  Coherent  Thought Content:  WDL  Suicidal Thoughts:  No  Homicidal Thoughts:  No  Judgement:  Impaired  Insight:  Lacking  Psychomotor Activity:  Normal  Akathisia:  No  Fund of Knowledge:  Poor      Assets:  Leisure Time Others:  ACTT team  Cognition:  Impaired,  Mild  ADL's:  Intact  AIMS (if indicated):        Other History   These have been pulled in through the EMR, reviewed, and updated if appropriate.  Family History:  The patient's family history includes Asthma  in her father; Mental illness in her father; PDD in her brother; Seizures in her brother.  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    Chronic constipation    Depression    Drug-seeking behavior    Essential tremor    Headache    Ineffective individual coping 05/16/2022   Insomnia 01/12/2022   Intentional drug overdose (HCC) 06/05/2022   Neuromuscular disorder (HCC)    Normocytic anemia 06/05/2022   Overdose 07/22/2017   Overdose of acetaminophen 07/2017   and other meds   Overdose, intentional self-harm, initial encounter (HCC) 07/20/2021  Paranoia (HCC) 04/22/2021   Paroxysmal atrial fibrillation (HCC)    Personality disorder (HCC)    Prolonged QT interval    Purposeful non-suicidal drug ingestion (HCC) 06/27/2021   Schizo-affective psychosis (HCC)    Schizoaffective disorder (HCC) 07/29/2022   Schizoaffective disorder, bipolar type (HCC)    Seizures (HCC)    Last seizure December 2017   Skin erythema 04/27/2022   Sleep apnea    Suicidal behavior 07/25/2021   Suicidal ideation    Suicide (HCC) 07/01/2021   Suicide attempt (HCC) 07/04/2021    Surgical History: Past Surgical History:  Procedure Laterality Date   MOUTH SURGERY  2009 or 2010     Medications:   Current Facility-Administered Medications:    albuterol (VENTOLIN HFA) 108 (90 Base) MCG/ACT inhaler 2 puff, 2 puff, Inhalation, Q6H PRN, Cathren Laine, MD   benztropine (COGENTIN) tablet 1 mg, 1 mg, Oral, QHS, Cathren Laine, MD, 1 mg at 04/26/23 2102   DULoxetine (CYMBALTA) DR capsule 60 mg, 60 mg, Oral, Daily, Cathren Laine, MD   metoprolol tartrate (LOPRESSOR) tablet 25 mg,  25 mg, Oral, BID, Cathren Laine, MD, 25 mg at 04/26/23 2101   mometasone-formoterol (DULERA) 200-5 MCG/ACT inhaler 2 puff, 2 puff, Inhalation, BID, Cathren Laine, MD, 2 puff at 04/26/23 2100   OLANZapine (ZYPREXA) tablet 10 mg, 10 mg, Oral, QHS, Cathren Laine, MD, 10 mg at 04/26/23 2101   risperiDONE (RISPERDAL) tablet 2 mg, 2 mg, Oral, BID, Cathren Laine, MD, 2 mg at 04/26/23 2102   traZODone (DESYREL) tablet 100 mg, 100 mg, Oral, QHS, Cathren Laine, MD, 100 mg at 04/27/23 0015  Current Outpatient Medications:    albuterol (VENTOLIN HFA) 108 (90 Base) MCG/ACT inhaler, Inhale 2 puffs into the lungs every 6 (six) hours as needed for wheezing or shortness of breath., Disp: , Rfl:    benztropine (COGENTIN) 1 MG tablet, Take 1 mg by mouth at bedtime., Disp: , Rfl:    DULoxetine (CYMBALTA) 60 MG capsule, Take 60 mg by mouth daily., Disp: , Rfl:    metFORMIN (GLUCOPHAGE) 500 MG tablet, Take 1 tablet (500 mg total) by mouth daily with breakfast. (Patient taking differently: Take 500 mg by mouth 2 (two) times daily with a meal.), Disp: , Rfl:    OLANZapine (ZYPREXA) 10 MG tablet, Take 10 mg by mouth at bedtime., Disp: , Rfl:    propafenone (RYTHMOL SR) 425 MG 12 hr capsule, Take 425 mg by mouth in the morning and at bedtime., Disp: , Rfl:    risperiDONE (RISPERDAL) 3 MG tablet, Take 3 mg by mouth in the morning and at bedtime., Disp: , Rfl:    SYMBICORT 160-4.5 MCG/ACT inhaler, Inhale 2 puffs into the lungs in the morning and at bedtime., Disp: , Rfl:    traZODone (DESYREL) 100 MG tablet, Take 100 mg by mouth at bedtime., Disp: , Rfl:   Allergies: Allergies  Allergen Reactions   Bee Venom Anaphylaxis   Coconut Flavoring Agent (Non-Screening) Anaphylaxis and Rash   Fish Allergy Anaphylaxis   Geodon [Ziprasidone Hcl] Other (See Comments)    Pt states that this medication causes paralysis of the mouth.     Haloperidol And Related Other (See Comments)    Pt states that this medication causes paralysis  of the mouth, jaw locks up   Lithobid [Lithium] Other (See Comments)    Seizure-like activity    Roxicodone [Oxycodone] Other (See Comments)    Hallucinations    Seroquel [Quetiapine] Other (See Comments)    Severe drowsiness  Shellfish Allergy Anaphylaxis   Phenergan [Promethazine Hcl] Other (See Comments)    Chest pain     Prilosec [Omeprazole] Nausea And Vomiting and Other (See Comments)    Pt can take protonix with no problems    Sulfa Antibiotics Other (See Comments)    Chest pain    Tegretol [Carbamazepine] Nausea And Vomiting   Prozac [Fluoxetine] Other (See Comments)    Increased Depression and Suicidal thoughts   Tape Other (See Comments)    Skin tears, can only tolerate paper tape.   Tylenol [Acetaminophen] Rash and Other (See Comments)    Rash on face     Thomes Lolling, NP

## 2023-04-27 NOTE — Discharge Instructions (Signed)
 Substance Abuse Treatment Programs  Intensive Outpatient Programs [facility]     [address]      [place], [place]                   [phone]       [facility] [address] [place] [address] [place], [place] [phone]  [name]     (Inpatient and outpatient)     [address]           [phone]    [facility] [phone] (Suboxone and Methadone)  [address]      [place], [place] [address]      [phone]       [address] [place], [place] [phone]  [facility] (Outpatient/Inpatient, [facility])    (insurance only) [phone]             [facility] (Groups & Residential) [place], [place] [phone]     [facility]     [address]     [place], [place]      [phone]       [facility] (for caregivers and family) [phone] [address] [place], [place] [phone]      Residential Treatment Programs [facility]      [address], [place], [place] [address]  [phone]       [facility] [address] [place], [place] [address] [phone]  [facility]        [phone]       [facility] [phone]  [facility]             [address]                                         [place], [place]                                                [phone] or [phone]                               [facility] [address] [place], [address] [phone]  [facility]    [address]      [place], [place]     [phone]       [facility] [address] [place], [place] [phone]  [facility]   [address] [place], [place] [address]     [phone]      Admissions: 8am-3pm M-F  [facility] ([facility]) [address] [place],  [place] [phone]  [facility]: Residential Program [phone] Days)   [place], [place]      [phone] or [phone]     [facility]: [address] [place], [place] (Walk in Hours over the weekend or by referral)  [facility] [address] [place], [place], [place] [address] [phone]  Crisis Mobile: [facility]:  [phone] (for crisis response 24 hours a day) [facility] Hotline:      [phone] Outpatient Psychiatry and Counseling  [facility]: Mobile Crisis  Management 24 hours:  [phone]  [facility] [employer] sliding scale fee and walk in schedule: M-F 8am-12pm/1pm-3pm [address]  [place], [place] [address] [phone]  [facility] [address] [place], [place] [address] [phone]  [facility] ([facility])- new patient walk-in appointments available Monday - Friday 8am -3pm.          [address] [place], [place] [address] [phone] or crisis line- [phone]  [facility]/ Intensive Outpatient Therapy Program [address] [place], [place] [address] [phone]  [facility]                  Crisis Services      [phone] [address]     [place], [place] [address]                 [facility]   [facility] [phone]. [address] [place], [place] [address]   [employer] of Care          [address]  [place], [place] [address]       [phone]  [facility] [address] [place], [place] [address] [phone]  [facility]    [address]    [place], [place] [address]     [phone]       Andee Poles, MD     [address]     [place] [address]     [phone]       [facility] [address] [place] [address]  [facility]     [address] [place], [place]      [phone]       [facility] Eulogio Ditch, MD [address] [place], [place] [address] [phone]  [facility]     [address]     [place], [place] [address]     [phone]       [facility] [address] [place], [place] [address] [phone] Resources for Temporary Residential Assistance/Crisis Centers  [facility] M-F 8am-3pm   [address] [place], [place] [address]   [phone] Services include: laundry, barbering, support groups, case management, phone  & computer access, showers, AA/NA mtgs, mental health/substance abuse nurse, job skills class, disability information, VA assistance, spiritual classes, etc.   HOMELESS SHELTERS  [facility]     [facility]   [address], [place]     [phone]              [employer] (women and children)       [name] [place], [place] [address] [phone] [email or web address] for application and process Application Required  [facility]   [address]    [place] [address]     [phone]                    [facility] [place] [address] [place], [place] [address] [phone] application appt.) Application Required  [facility] (women only)    [address]     [place], [place] [address]     [phone]  Intake starts 6pm daily Need valid ID, SSC, & Police report [employer] [address] [place], [place] [phone] Application Required  [employer] (men only)     [address]      [place], [place]     [phone]       [facility] [place] (Pregnant women only) [address] [place], [place] [phone]  [facility]      [address]      [place], [place] [address]     [phone]             [facility] [address] [place], [place] [phone] 90 day commitment/SA/Application process  [facility](men only)     [address]     [place], [place]     [phone]       Check-in at [facility] [address] [place], [place] [address] [phone] Men/Women/Women and Children must be there by 7 pm  [facility] [place], [place] [phone]

## 2023-04-27 NOTE — ED Provider Notes (Signed)
 Emergency Medicine Observation Re-evaluation Note  [name] is a 33 y.o. female, seen on rounds today.  Pt initially presented to the ED for complaints of Suicidal Currently, the patient is awaiting re-evaluation.  Physical Exam  BP 126/76   Pulse 91   Temp 98 F (36.7 C)   Resp 16   Ht 5\' 5"  (1.651 m)   Wt 112.7 kg   LMP  (LMP Unknown)   SpO2 99%   BMI 41.35 kg/m  Physical Exam General: Calm Cardiac: Well perfused Lungs: Even respirations Psych: Calm  ED Course / MDM  EKG:   I have reviewed the labs performed to date as well as medications administered while in observation.  Recent changes in the last 24 hours include re-evaluation by TTS this AM. Recommending d/c from psychiatry perspective.  Plan  Current plan is for discharge. Patient feeling ok with the plan at discharge. Denies active SI.     Maia Plan, MD 04/27/23 [phone]

## 2023-04-29 ENCOUNTER — Other Ambulatory Visit: Payer: Self-pay

## 2023-04-29 ENCOUNTER — Emergency Department (HOSPITAL_COMMUNITY): Admission: EM | Admit: 2023-04-29 | Discharge: 2023-04-29 | Discharge status: Against Medical Advice

## 2023-04-29 ENCOUNTER — Emergency Department (HOSPITAL_COMMUNITY)
Admission: EM | Admit: 2023-04-29 | Discharge: 2023-04-29 | Disposition: A | Discharge status: Home / Self Care | Attending: Emergency Medicine | Admitting: Emergency Medicine

## 2023-04-29 ENCOUNTER — Encounter (HOSPITAL_COMMUNITY): Payer: Self-pay

## 2023-04-29 DIAGNOSIS — F22 Delusional disorders: Secondary | ICD-10-CM | POA: Insufficient documentation

## 2023-04-29 DIAGNOSIS — R109 Unspecified abdominal pain: Secondary | ICD-10-CM | POA: Diagnosis not present

## 2023-04-29 NOTE — ED Triage Notes (Signed)
 Pt arrived POV from home c/o abdominal pain and being in labor with triplets, having bilateral busted knees and states her pericardial sack has busted open and it is leaking fluid around my heart.

## 2023-04-29 NOTE — ED Notes (Signed)
 Pt cleared for discharge by EDP. AVS provided by EDP was reviewed with the pt. Pt verbalized understanding pt ambulatory with no distress at time of discharge.

## 2023-04-29 NOTE — ED Notes (Signed)
 Pt seen walking out of ED upon arrival by EMS

## 2023-04-29 NOTE — Discharge Instructions (Signed)
 Return for thoughts of hurting yourself or others. Follow up with your family doc.

## 2023-04-29 NOTE — ED Provider Notes (Signed)
 [place] EMERGENCY DEPARTMENT AT [facility] Provider Note   CSN: [id number] Arrival date & time: 04/29/23  2000     History  Chief Complaint  Patient presents with   Abdominal Pain   Psychiatric Evaluation    [name] is a 33 y.o. female.  33 yo F with a chief complaint that her pericardial sac is leaking fluid and that it might kill her unborn twins of it is not resolved.  She is also complaining of leaking of synovial fluid from both of her knees.  She tells me that she needs to have a surgery performed urgently.  She tells me that she has a CT surgeon that is ready to help take her to the OR.   Abdominal Pain      Home Medications Prior to Admission medications   Medication Sig Start Date End Date Taking? Authorizing Provider  albuterol (VENTOLIN HFA) 108 (90 Base) MCG/ACT inhaler Inhale 2 puffs into the lungs every 6 (six) hours as needed for wheezing or shortness of breath. 04/09/23   Ardis Hughs, NP  benztropine (COGENTIN) 1 MG tablet Take 1 mg by mouth at bedtime. 03/21/23   [provider]  DULoxetine (CYMBALTA) 60 MG capsule Take 60 mg by mouth daily.    [provider]  metFORMIN (GLUCOPHAGE) 500 MG tablet Take 1 tablet (500 mg total) by mouth daily with breakfast. Patient taking differently: Take 500 mg by mouth 2 (two) times daily with a meal. 04/10/23   Ardis Hughs, NP  OLANZapine (ZYPREXA) 10 MG tablet Take 10 mg by mouth at bedtime. 04/25/23   [provider]  propafenone (RYTHMOL SR) 425 MG 12 hr capsule Take 425 mg by mouth in the morning and at bedtime. 03/18/23   [provider]  risperiDONE (RISPERDAL) 3 MG tablet Take 3 mg by mouth in the morning and at bedtime.    [provider]  SYMBICORT 160-4.5 MCG/ACT inhaler Inhale 2 puffs into the lungs in the morning and at bedtime. 01/24/23   [provider]  traZODone (DESYREL) 100 MG tablet Take 100 mg by mouth at bedtime.     [provider]      Allergies    Bee venom, Coconut flavoring agent (non-screening), Fish allergy, Geodon [ziprasidone hcl], Haloperidol and related, Lithobid [lithium], Roxicodone [oxycodone], Seroquel [quetiapine], Shellfish allergy, Phenergan [promethazine hcl], Prilosec [omeprazole], Sulfa antibiotics, Tegretol [carbamazepine], Prozac [fluoxetine], Tape, and Tylenol [acetaminophen]    Review of Systems   Review of Systems  Gastrointestinal:  Positive for abdominal pain.    Physical Exam Updated Vital Signs BP (!) 137/91 (BP Location: Right Arm)   Pulse (!) 109   Temp 98.1 F (36.7 C) (Oral)   Resp 19   Ht 5\' 4"  (1.626 m)   LMP  (LMP Unknown)   SpO2 98%   BMI 42.65 kg/m  Physical Exam Vitals and nursing note reviewed.  Constitutional:      General: She is not in acute distress.    Appearance: She is well-developed. She is not diaphoretic.  HENT:     Head: Normocephalic and atraumatic.  Eyes:     Pupils: Pupils are equal, round, and reactive to light.  Cardiovascular:     Rate and Rhythm: Normal rate and regular rhythm.     Heart sounds: No murmur heard.    No friction rub. No gallop.  Pulmonary:     Effort: Pulmonary effort is normal.     Breath sounds: No  wheezing or rales.  Abdominal:     General: There is no distension.     Palpations: Abdomen is soft.     Tenderness: There is no abdominal tenderness.  Musculoskeletal:        General: No tenderness.     Cervical back: Normal range of motion and neck supple.  Skin:    General: Skin is warm and dry.  Neurological:     Mental Status: She is alert and oriented to person, place, and time.  Psychiatric:        Behavior: Behavior normal.     ED Results / Procedures / Treatments   Labs (all labs ordered are listed, but only abnormal results are displayed) Labs Reviewed - No data to display  EKG None  Radiology No results found.  Procedures Procedures    Medications Ordered in  ED Medications - No data to display  ED Course/ Medical Decision Making/ A&P                                 Medical Decision Making  33 yo F with a significant past medical history of schizoaffective disorder comes in with a chief complaints of concerned that she needs procedure performed for her pericardium because she said is leaking fluid.  Patient is well-appearing and nontoxic.  She has had 22 visits in the last 6 months.  She actually was seen 3 times this week.  She checked in earlier today with EMS and then left without being seen.  She was recently seen by TTS 3 days ago.  She has had lab work 3 days ago.  She had negative pregnancy test at that time.  I do not feel she benefit from a repeat psychiatric evaluation in the emergency department setting.  I will have her follow-up with the behavioral health urgent care center if she so chooses.  Also encouraged her to follow-up with her primary care doctor and her OB/GYN.  10:07 PM:  I have discussed the diagnosis/risks/treatment options with the patient.  Evaluation and diagnostic testing in the emergency department does not suggest an emergent condition requiring admission or immediate intervention beyond what has been performed at this time.  They will follow up with PCP, GYN. We also discussed returning to the ED immediately if new or worsening sx occur. We discussed the sx which are most concerning (e.g., sudden worsening pain, fever, inability to tolerate by mouth) that necessitate immediate return. Medications administered to the patient during their visit and any new prescriptions provided to the patient are listed below.  Medications given during this visit Medications - No data to display   The patient appears reasonably screen and/or stabilized for discharge and I doubt any other medical condition or other [facility] requiring further screening, evaluation, or treatment in the ED at this time prior to discharge.          Final  Clinical Impression(s) / ED Diagnoses Final diagnoses:  Delusion [facility])    Rx / DC Orders ED Discharge Orders     None         Melene Plan, DO 04/29/23 2207

## 2023-04-29 NOTE — ED Notes (Signed)
 Pt walked out of triage and stated we could not get an EKG or blood work. Pt is not SI or HI at this time.

## 2023-04-30 ENCOUNTER — Encounter (HOSPITAL_COMMUNITY): Payer: Self-pay

## 2023-04-30 ENCOUNTER — Emergency Department (HOSPITAL_COMMUNITY)
Admission: EM | Admit: 2023-04-30 | Discharge: 2023-05-01 | Disposition: A | Discharge status: Other Acute Inpatient Hospital | Source: Home / Self Care | Attending: Emergency Medicine | Admitting: Emergency Medicine

## 2023-04-30 ENCOUNTER — Encounter (HOSPITAL_COMMUNITY): Payer: Self-pay | Admitting: Emergency Medicine

## 2023-04-30 ENCOUNTER — Emergency Department (HOSPITAL_COMMUNITY)
Admission: EM | Admit: 2023-04-30 | Discharge: 2023-04-30 | Disposition: A | Discharge status: Home / Self Care | Attending: Emergency Medicine | Admitting: Emergency Medicine

## 2023-04-30 ENCOUNTER — Other Ambulatory Visit: Payer: Self-pay

## 2023-04-30 ENCOUNTER — Emergency Department (HOSPITAL_COMMUNITY)
Admission: EM | Admit: 2023-04-30 | Discharge: 2023-04-30 | Disposition: A | Discharge status: Home / Self Care | Source: Home / Self Care | Attending: Emergency Medicine | Admitting: Emergency Medicine

## 2023-04-30 DIAGNOSIS — F840 Autistic disorder: Secondary | ICD-10-CM | POA: Insufficient documentation

## 2023-04-30 DIAGNOSIS — Z7984 Long term (current) use of oral hypoglycemic drugs: Secondary | ICD-10-CM | POA: Insufficient documentation

## 2023-04-30 DIAGNOSIS — Z3A00 Weeks of gestation of pregnancy not specified: Secondary | ICD-10-CM | POA: Insufficient documentation

## 2023-04-30 DIAGNOSIS — F22 Delusional disorders: Secondary | ICD-10-CM | POA: Diagnosis present

## 2023-04-30 DIAGNOSIS — F250 Schizoaffective disorder, bipolar type: Secondary | ICD-10-CM | POA: Insufficient documentation

## 2023-04-30 DIAGNOSIS — R45851 Suicidal ideations: Secondary | ICD-10-CM | POA: Insufficient documentation

## 2023-04-30 DIAGNOSIS — Z5900 Homelessness unspecified: Secondary | ICD-10-CM | POA: Insufficient documentation

## 2023-04-30 DIAGNOSIS — F319 Bipolar disorder, unspecified: Secondary | ICD-10-CM | POA: Insufficient documentation

## 2023-04-30 DIAGNOSIS — J45909 Unspecified asthma, uncomplicated: Secondary | ICD-10-CM | POA: Insufficient documentation

## 2023-04-30 DIAGNOSIS — O99340 Other mental disorders complicating pregnancy, unspecified trimester: Secondary | ICD-10-CM | POA: Insufficient documentation

## 2023-04-30 DIAGNOSIS — Z5902 Unsheltered homelessness: Secondary | ICD-10-CM | POA: Diagnosis present

## 2023-04-30 LAB — CBC WITH DIFFERENTIAL/PLATELET
Abs Immature Granulocytes: 0.02 10*3/uL (ref 0.00–0.07)
Basophils Absolute: 0 10*3/uL (ref 0.0–0.1)
Basophils Relative: 0 %
Eosinophils Absolute: 0.1 10*3/uL (ref 0.0–0.5)
Eosinophils Relative: 1 %
HCT: 35.9 % — ABNORMAL LOW (ref 36.0–46.0)
Hemoglobin: 10.9 g/dL — ABNORMAL LOW (ref 12.0–15.0)
Immature Granulocytes: 0 %
Lymphocytes Relative: 27 %
Lymphs Abs: 2 10*3/uL (ref 0.7–4.0)
MCH: 24.7 pg — ABNORMAL LOW (ref 26.0–34.0)
MCHC: 30.4 g/dL (ref 30.0–36.0)
MCV: 81.4 fL (ref 80.0–100.0)
Monocytes Absolute: 0.6 10*3/uL (ref 0.1–1.0)
Monocytes Relative: 8 %
Neutro Abs: 4.7 10*3/uL (ref 1.7–7.7)
Neutrophils Relative %: 64 %
Platelets: 267 10*3/uL (ref 150–400)
RBC: 4.41 MIL/uL (ref 3.87–5.11)
RDW: 15.5 % (ref 11.5–15.5)
WBC: 7.4 10*3/uL (ref 4.0–10.5)
nRBC: 0 % (ref 0.0–0.2)

## 2023-04-30 LAB — RAPID URINE DRUG SCREEN, HOSP PERFORMED
Amphetamines: NOT DETECTED
Barbiturates: NOT DETECTED
Benzodiazepines: NOT DETECTED
Cocaine: NOT DETECTED
Opiates: NOT DETECTED
Tetrahydrocannabinol: NOT DETECTED

## 2023-04-30 LAB — COMPREHENSIVE METABOLIC PANEL
ALT: 18 U/L (ref 0–44)
AST: 14 U/L — ABNORMAL LOW (ref 15–41)
Albumin: 3.9 g/dL (ref 3.5–5.0)
Alkaline Phosphatase: 68 U/L (ref 38–126)
Anion gap: 7 (ref 5–15)
BUN: 6 mg/dL (ref 6–20)
CO2: 23 mmol/L (ref 22–32)
Calcium: 8.9 mg/dL (ref 8.9–10.3)
Chloride: 107 mmol/L (ref 98–111)
Creatinine, Ser: 0.66 mg/dL (ref 0.44–1.00)
GFR, Estimated: >60 mL/min (ref >60)
Glucose, Bld: 107 mg/dL — ABNORMAL HIGH (ref 70–99)
Potassium: 3.6 mmol/L (ref 3.5–5.1)
Sodium: 137 mmol/L (ref 135–145)
Total Bilirubin: 0.4 mg/dL (ref 0.0–1.2)
Total Protein: 7 g/dL (ref 6.5–8.1)

## 2023-04-30 LAB — ETHANOL: Alcohol, Ethyl (B): <10 mg/dL (ref <10)

## 2023-04-30 LAB — HCG, SERUM, QUALITATIVE: Preg, Serum: NEGATIVE

## 2023-04-30 MED ORDER — OLANZAPINE 10 MG PO TABS
10.0000 mg | ORAL_TABLET | Freq: Every day | ORAL | Status: DC
  Administered 2023-04-30: 10 mg via ORAL
  Filled 2023-04-30: qty 1

## 2023-04-30 MED ORDER — METFORMIN HCL 500 MG PO TABS
500.0000 mg | ORAL_TABLET | Freq: Two times a day (BID) | ORAL | Status: DC
  Administered 2023-05-01: 500 mg via ORAL
  Filled 2023-04-30: qty 1

## 2023-04-30 MED ORDER — RISPERIDONE 2 MG PO TABS
2.0000 mg | ORAL_TABLET | Freq: Two times a day (BID) | ORAL | Status: DC
Start: 2023-04-30 — End: 2023-04-30

## 2023-04-30 MED ORDER — ALBUTEROL SULFATE HFA 108 (90 BASE) MCG/ACT IN AERS: 2.0000 | INHALATION_SPRAY | Freq: Four times a day (QID) | RESPIRATORY_TRACT | Status: DC | PRN

## 2023-04-30 MED ORDER — TRAZODONE HCL 50 MG PO TABS
50.0000 mg | ORAL_TABLET | Freq: Every day | ORAL | Status: DC
  Administered 2023-04-30: 50 mg via ORAL
  Filled 2023-04-30: qty 1

## 2023-04-30 MED ORDER — RISPERIDONE 2 MG PO TABS
2.0000 mg | ORAL_TABLET | Freq: Two times a day (BID) | ORAL | Status: DC
  Administered 2023-04-30 – 2023-05-01 (×2): 2 mg via ORAL
  Filled 2023-04-30 (×2): qty 1

## 2023-04-30 MED ORDER — DULOXETINE HCL 30 MG PO CPEP
60.0000 mg | ORAL_CAPSULE | Freq: Every day | ORAL | Status: DC
  Administered 2023-05-01: 60 mg via ORAL
  Filled 2023-04-30: qty 2

## 2023-04-30 MED ORDER — TRAZODONE HCL 50 MG PO TABS: 50.0000 mg | ORAL_TABLET | Freq: Every day | ORAL | Status: DC

## 2023-04-30 MED ORDER — BENZTROPINE MESYLATE 1 MG PO TABS
1.0000 mg | ORAL_TABLET | Freq: Every day | ORAL | Status: DC
Start: 2023-04-30 — End: 2023-04-30

## 2023-04-30 MED ORDER — BENZTROPINE MESYLATE 1 MG PO TABS
1.0000 mg | ORAL_TABLET | Freq: Every day | ORAL | Status: DC
  Administered 2023-04-30: 1 mg via ORAL
  Filled 2023-04-30: qty 1

## 2023-04-30 NOTE — ED Triage Notes (Signed)
 Pt returns after being discharged recently, states she feels blood spurting in her chest r/t her pericardial sac that has busted. No distress noted

## 2023-04-30 NOTE — Consult Note (Signed)
 Attempt to evaluate failed as patient remains drowsy.  Moving patient from one side of the Psych ER to another she was held by the RN as she just could not stay awake.  Provider will try later to evaluate patient.

## 2023-04-30 NOTE — Discharge Instructions (Signed)
 Substance Abuse Treatment Programs  Intensive Outpatient Programs [facility]     [address]      [place], [place]                   [phone]       [facility] [address] [place] [address] [place], [place] [phone]  [name]     (Inpatient and outpatient)     [address]           [phone]    [facility] [phone] (Suboxone and Methadone)  [address]      [place], [place] [address]      [phone]       [address] [place], [place] [phone]  [facility] (Outpatient/Inpatient, [facility])    (insurance only) [phone]             [facility] (Groups & Residential) [place], [place] [phone]     [facility]     [address]     [place], [place]      [phone]       [facility] (for caregivers and family) [phone] [address] [place], [place] [phone]      Residential Treatment Programs [facility]      [address], [place], [place] [address]  [phone]       [facility] [address] [place], [place] [address] [phone]  [facility]        [phone]       [facility] [phone]  [facility]             [address]                                         [place], [place]                                                [phone] or [phone]                               [facility] [address] [place], [address] [phone]  [facility]    [address]      [place], [place]     [phone]       [facility] [address] [place], [place] [phone]  [facility]   [address] [place], [place] [address]     [phone]      Admissions: 8am-3pm M-F  [facility] ([facility]) [address] [place],  [place] [phone]  [facility]: Residential Program [phone] Days)   [place], [place]      [phone] or [phone]     [facility]: [address] [place], [place] (Walk in Hours over the weekend or by referral)  [facility] [address] [place], [place], [place] [address] [phone]  Crisis Mobile: [facility]:  [phone] (for crisis response 24 hours a day) [facility] Hotline:      [phone] Outpatient Psychiatry and Counseling  [facility]: Mobile Crisis  Management 24 hours:  [phone]  [facility] [employer] sliding scale fee and walk in schedule: M-F 8am-12pm/1pm-3pm [address]  [place], [place] [address] [phone]  [facility] [address] [place], [place] [address] [phone]  [facility] ([facility])- new patient walk-in appointments available Monday - Friday 8am -3pm.          [address] [place], [place] [address] [phone] or crisis line- [phone]  [facility]/ Intensive Outpatient Therapy Program [address] [place], [place] [address] [phone]  [facility]                  Crisis Services      [phone] [address]     [place], [place] [address]                 [facility]   [facility] [phone]. [address] [place], [place] [address]   [employer] of Care          [address]  [place], [place] [address]       [phone]  [facility] [address] [place], [place] [address] [phone]  [facility]    [address]    [place], [place] [address]     [phone]       Andee Poles, MD     [address]     [place] [address]     [phone]       [facility] [address] [place] [address]  [facility]     [address] [place], [place]      [phone]       [facility] Eulogio Ditch, MD [address] [place], [place] [address] [phone]  [facility]     [address]     [place], [place] [address]     [phone]       [facility] [address] [place], [place] [address] [phone] Resources for Temporary Residential Assistance/Crisis Centers  [facility] M-F 8am-3pm   [address] [place], [place] [address]   [phone] Services include: laundry, barbering, support groups, case management, phone  & computer access, showers, AA/NA mtgs, mental health/substance abuse nurse, job skills class, disability information, VA assistance, spiritual classes, etc.   HOMELESS SHELTERS  [facility]     [facility]   [address], [place]     [phone]              [employer] (women and children)       [name] [place], [place] [address] [phone] [email or web address] for application and process Application Required  [facility]   [address]    [place] [address]     [phone]                    [facility] [place] [address] [place], [place] [address] [phone] application appt.) Application Required  [facility] (women only)    [address]     [place], [place] [address]     [phone]  Intake starts 6pm daily Need valid ID, SSC, & Police report [employer] [address] [place], [place] [phone] Application Required  [employer] (men only)     [address]      [place], [place]     [phone]       [facility] [place] (Pregnant women only) [address] [place], [place] [phone]  [facility]      [address]      [place], [place] [address]     [phone]             [facility] [address] [place], [place] [phone] 90 day commitment/SA/Application process  [facility](men only)     [address]     [place], [place]     [phone]       Check-in at [facility] [address] [place], [place] [address] [phone] Men/Women/Women and Children must be there by 7 pm  [facility] [place], [place] [phone]

## 2023-04-30 NOTE — ED Triage Notes (Signed)
 Pt BIB [name]. Patient found wandering around a park, reporting SI and stating that she felt her pericardial sac rupture and leak into her abdomen. Patient reports suicidal ideation without a plan.

## 2023-04-30 NOTE — ED Notes (Signed)
 Patient to room 37. Patient was drowsy. Patient ambulated to room.  Patient sleeping, breaths equal and unlabored.

## 2023-04-30 NOTE — ED Provider Notes (Signed)
 [place] EMERGENCY DEPARTMENT AT [facility] Provider Note   CSN: [id number] Arrival date & time: 04/30/23  1320     History  Chief Complaint  Patient presents with   "peri-cardial leakage"    Mel Tadros is a 33 y.o. female.  With a history of schizoaffective disorder, bipolar type, self-injurious behavior who presents to the ED for mental health complaints.  Patient has been seen multiple times in the last several days in the ED.  She feels as though her "pericardium burst".  She felt a "swishing" sensation in her chest.  Was seen overnight at [facility] for same complaint and discharged earlier this morning.  Along with the systemic complaints she continues to endorse suicidal ideation and states her medications have not been right for the last 2 weeks.  She is requesting to talk to mental health professional.  No attempt at self-harm today.  HPI     Home Medications Prior to Admission medications   Medication Sig Start Date End Date Taking? Authorizing Provider  albuterol (VENTOLIN HFA) 108 (90 Base) MCG/ACT inhaler Inhale 2 puffs into the lungs every 6 (six) hours as needed for wheezing or shortness of breath. 04/09/23   Ardis Hughs, NP  benztropine (COGENTIN) 1 MG tablet Take 1 mg by mouth at bedtime. 03/21/23   [provider]  DULoxetine (CYMBALTA) 60 MG capsule Take 60 mg by mouth daily.    [provider]  metFORMIN (GLUCOPHAGE) 500 MG tablet Take 1 tablet (500 mg total) by mouth daily with breakfast. Patient taking differently: Take 500 mg by mouth 2 (two) times daily with a meal. 04/10/23   Ardis Hughs, NP  OLANZapine (ZYPREXA) 10 MG tablet Take 10 mg by mouth at bedtime. 04/25/23   [provider]  propafenone (RYTHMOL SR) 425 MG 12 hr capsule Take 425 mg by mouth in the morning and at bedtime. 03/18/23   [provider]  risperiDONE (RISPERDAL) 3 MG tablet Take 3 mg by mouth in the morning and at bedtime.     [provider]  SYMBICORT 160-4.5 MCG/ACT inhaler Inhale 2 puffs into the lungs in the morning and at bedtime. 01/24/23   [provider]  traZODone (DESYREL) 100 MG tablet Take 100 mg by mouth at bedtime.    [provider]      Allergies    Bee venom, Coconut flavoring agent (non-screening), Fish allergy, Geodon [ziprasidone hcl], Haloperidol and related, Lithobid [lithium], Roxicodone [oxycodone], Seroquel [quetiapine], Shellfish allergy, Phenergan [promethazine hcl], Prilosec [omeprazole], Sulfa antibiotics, Tegretol [carbamazepine], Prozac [fluoxetine], Tape, and Tylenol [acetaminophen]    Review of Systems   Review of Systems  Physical Exam Updated Vital Signs BP 122/82 (BP Location: Right Arm)   Pulse (!) 115   Temp 98.9 F (37.2 C) (Oral)   Resp 18   Ht 5\' 4"  (1.626 m)   Wt 112.7 kg   LMP  (LMP Unknown)   SpO2 99%   BMI 42.65 kg/m  Physical Exam Vitals and nursing note reviewed.  HENT:     Head: Normocephalic and atraumatic.  Eyes:     Pupils: Pupils are equal, round, and reactive to light.  Cardiovascular:     Rate and Rhythm: Normal rate and regular rhythm.  Pulmonary:     Effort: Pulmonary effort is normal.     Breath sounds: Normal breath sounds.  Abdominal:     Palpations: Abdomen is soft.     Tenderness: There is no abdominal tenderness.  Skin:    General: Skin is warm and dry.  Neurological:     Mental Status: She is alert.  Psychiatric:        Mood and Affect: Mood normal.     ED Results / Procedures / Treatments   Labs (all labs ordered are listed, but only abnormal results are displayed) Labs Reviewed  HCG, SERUM, QUALITATIVE  COMPREHENSIVE METABOLIC PANEL  ETHANOL  RAPID URINE DRUG SCREEN, HOSP PERFORMED  CBC WITH DIFFERENTIAL/PLATELET    EKG None  Radiology No results found.  Procedures Ultrasound ED Echo  Date/Time: 04/30/2023 2:27 PM  Performed by: Royanne Foots, DO Authorized by: Royanne Foots, DO   Procedure details:    Indications: chest pain     Views: subxiphoid, parasternal long axis view, parasternal short axis view, apical 4 chamber view and IVC view     Images: archived   Findings:    Pericardium: no pericardial effusion     LV Function: normal (>50% EF)     RV Diameter: normal     IVC: normal   Impression:    Impression: normal       Medications Ordered in ED Medications - No data to display  ED Course/ Medical Decision Making/ A&P Clinical Course as of 04/30/23 1502  Tue Apr 30, 2023  1501 I, Estelle June DO, am transitioning care of this patient to the oncoming provider pending laboratory workup, EKG for medical clearance followed by psychiatric evaluation and disposition [MP]    Clinical Course User Index [MP] Royanne Foots, DO                                 Medical Decision Making 33 year old female with extensive psychiatric history as above returns for stated complaint of "pericardial burst".  She was already evaluated for this last night.  I performed bedside echo with the hope that visualizing her pericardium might mitigate some of her concerns.  Echo was negative on my assessment.  Will obtain medical screening labs and then TTS evaluation for stated complaint of SI and unmanaged schizophrenia.  No attempt at self-harm or overdose today  Amount and/or Complexity of Data Reviewed Labs: ordered.           Final Clinical Impression(s) / ED Diagnoses Final diagnoses:  Suicidal ideation  Schizoaffective disorder, bipolar type [facility])    Rx / DC Orders ED Discharge Orders     None         Royanne Foots, DO 04/30/23 1502

## 2023-04-30 NOTE — ED Notes (Signed)
Patient verbalizes understanding of discharge instructions. Opportunity for questioning and answers were provided. Armband removed by staff, pt discharged from ED. Pt ambulated out to lobby  

## 2023-04-30 NOTE — ED Notes (Signed)
 This writer attempted to obtain blood labs but no success. RN aware.

## 2023-04-30 NOTE — ED Provider Notes (Signed)
 [place] EMERGENCY DEPARTMENT AT [facility] Provider Note   CSN: [id number] Arrival date & time: 04/30/23  0201     History  Chief complaint-multiple complaints   [name] is a 33 y.o. female.  The history is provided by the patient.   Patient with extensive behavioral health history presents for multiple complaints  Patient reports that her "cardio sac" is leaking fluid because she hears a whooshing sound. There is no trauma reported.  She has been seen for this previously  Patient also reports that she is pregnant with twins and fears for miscarriage  Patient also reports that her knees are leaking synovial fluid   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 fragile X syndrome    Chronic constipation    Depression    Drug-seeking behavior    Essential tremor    Headache    Ineffective individual coping 05/16/2022   Insomnia 01/12/2022   Intentional drug overdose (HCC) 06/05/2022   Neuromuscular disorder (HCC)    Normocytic anemia 06/05/2022   Overdose 07/22/2017   Overdose of acetaminophen 07/2017   and other meds   Overdose, intentional self-harm, initial encounter (HCC) 07/20/2021   Paranoia (HCC) 04/22/2021   Paroxysmal atrial fibrillation (HCC)    Personality disorder (HCC)    Prolonged QT interval    Purposeful non-suicidal drug ingestion (HCC) 06/27/2021   Schizo-affective psychosis (HCC)    Schizoaffective disorder (HCC) 07/29/2022   Schizoaffective disorder, bipolar type (HCC)    Seizures (HCC)    Last seizure December 2017   Skin erythema 04/27/2022   Sleep apnea    Suicidal behavior 07/25/2021   Suicidal ideation    Suicide  (HCC) 07/01/2021   Suicide attempt (HCC) 07/04/2021    Home Medications Prior to Admission medications   Medication Sig Start Date End Date Taking? Authorizing Provider  albuterol (VENTOLIN HFA) 108 (90 Base) MCG/ACT inhaler Inhale 2 puffs into the lungs every 6 (six) hours as needed for wheezing or shortness of breath. 04/09/23   Ardis Hughs, NP  benztropine (COGENTIN) 1 MG tablet Take 1 mg by mouth at bedtime. 03/21/23   [provider]  DULoxetine (CYMBALTA) 60 MG capsule Take 60 mg by mouth daily.    [provider]  metFORMIN (GLUCOPHAGE) 500 MG tablet Take 1 tablet (500 mg total) by mouth daily with breakfast. Patient taking differently: Take 500 mg by mouth 2 (two) times daily with a meal. 04/10/23   Ardis Hughs, NP  OLANZapine (ZYPREXA) 10 MG tablet Take 10 mg by mouth at bedtime. 04/25/23   [provider]  propafenone (RYTHMOL SR) 425 MG 12 hr capsule Take 425 mg by mouth in the morning and at bedtime. 03/18/23   [provider]  risperiDONE (RISPERDAL) 3 MG tablet Take 3 mg by mouth in the morning and at bedtime.    [provider]  SYMBICORT 160-4.5 MCG/ACT inhaler Inhale 2 puffs into the lungs in the morning and at bedtime. 01/24/23   [provider]  traZODone (DESYREL) 100 MG tablet Take 100 mg by mouth at bedtime.    [provider]  Allergies    Bee venom, Coconut flavoring agent (non-screening), Fish allergy, Geodon [ziprasidone hcl], Haloperidol and related, Lithobid [lithium], Roxicodone [oxycodone], Seroquel [quetiapine], Shellfish allergy, Phenergan [promethazine hcl], Prilosec [omeprazole], Sulfa antibiotics, Tegretol [carbamazepine], Prozac [fluoxetine], Tape, and Tylenol [acetaminophen]    Review of Systems   Review of Systems  Musculoskeletal:  Positive for arthralgias.    Physical Exam Updated Vital Signs BP 123/81 (BP Location: Right Arm)   Pulse (!) 112   Temp 98.4 F (36.9 C)    Resp 16   Wt 112.7 kg   LMP  (LMP Unknown)   SpO2 97%   BMI 42.65 kg/m  Physical Exam CONSTITUTIONAL: Disheveled, mildly anxious HEAD: Normocephalic/atraumatic EYES: EOMI/PERRL ENMT: Mucous membranes moist NECK: supple no meningeal signs CV: S1/S2 noted, no murmurs/rubs/gallops noted Tachycardia to 102 LUNGS: Lungs are clear to auscultation bilaterally, no apparent distress ABDOMEN: soft, nontender, no rebound or guarding, bowel sounds noted throughout abdomen Obese NEURO: Pt is awake/alert/appropriate, moves all extremitiesx4.  No facial droop.   EXTREMITIES: pulses normal/equal, full ROM Distal pulses equal and intact.  No significant lower extremity edema.  Full range of motion of both lower extremities without difficulty SKIN: warm, color normal  ED Results / Procedures / Treatments   Labs (all labs ordered are listed, but only abnormal results are displayed) Labs Reviewed - No data to display  EKG None  Radiology No results found.  Procedures Procedures    Medications Ordered in ED Medications - No data to display  ED Course/ Medical Decision Making/ A&P                                 Medical Decision Making  Patient presents for her 23rd ER visit in 6 months.  She is now reporting that her "cardio sac" is leaking fluid and she is also pregnant with twins.  She had recent negative pregnancy test.  Her physical exam is overall unremarkable.  She is in no acute distress.  Patient appears to have chronic fixed delusions.  Patient to follow-up as an outpatient.        Final Clinical Impression(s) / ED Diagnoses Final diagnoses:  Delusions [facility])    Rx / DC Orders ED Discharge Orders     None         Zadie Rhine, MD 04/30/23 1610

## 2023-04-30 NOTE — ED Notes (Signed)
 Patient continues to be drowsy and sleeping, breaths equal and unlabored.

## 2023-04-30 NOTE — Consult Note (Signed)
 [facility] Psychiatric Consult Initial  Patient Name: .[name]  MRN: [id number]  DOB: 08-01-1990  Consult Order details:  Orders (From admission, onward)     Start     Ordered   04/30/23 1359  CONSULT TO CALL ACT TEAM       Ordering Provider: Royanne Foots, DO  Provider:  (Not yet assigned)  Question:  Reason for Consult?  Answer:  Psych consult   04/30/23 1358             Mode of Visit: In person    Psychiatry Consult Evaluation  Service Date: April 30, 2023 LOS:  LOS: 0 days  Chief Complaint "I need to get back on my Medication.  My parents are after me with the thing the Police zap people"  Primary Psychiatric Diagnoses  Paranoid Delusion 2.  Bipolar disorder, Depressed 3.  Homelessness  Assessment  [name] is a 33 y.o. female admitted: Presented to the EDfor 04/30/2023  1:22 PM for Paranoid Delusion stating her parents are after her. She carries the psychiatric diagnoses of PTSD, Bipolar disorder, Multiple suicide attempts, Borderline Personality disorder, Depressed with Psychotic  features, GAD  and has a past medical history of   Seizure, disorder, Migraine, Asthma and Fibromyalgia.  Her current presentation of Paranoia  is most consistent with Psychosis. She meets criteria for inpatient Psychiatry hospitalization based on her Psychotic symptoms, not taking Medications and not engaging in Mental health care.  Current outpatient psychotropic medications include Cymbalta, Risperidone, Trazodone, Cogentin and historically she has had a unknown  response to these medications. She was not compliant with medications prior to admission as evidenced by her report and her frequent use of ER for care and housing. On initial examination, patient was drowsy, slept all day and just woke up to eat and speak to Provider. Please see plan below for detailed recommendations.   Diagnoses:  Active Hospital problems: Principal Problem:   Paranoia  (HCC) Active Problems:   Unsheltered homelessness    Plan   ## Psychiatric Medication Recommendations:  Resume Cymbalta 60 mg po daily for Depression Risperidone 2 mg po bid for Psychosis/mood Trazodone 50 mg po at bed time for sleep Cogentin 1 mg po bid for Medication induced EPS. ## Medical Decision Making Capacity: Not specifically addressed in this encounter  ## Further Work-up:  -- most recent EKG on 04/30/23 had QtC of 467 -- Pertinent labwork reviewed earlier this admission includes: CBC, UDS   ## Disposition:-- We recommend inpatient psychiatric hospitalization when medically cleared. Patient is under voluntary admission status at this time; please IVC if attempts to leave hospital.  ## Behavioral / Environmental: - No specific recommendations at this time.     ## Safety and Observation Level:  - Based on my clinical evaluation, I estimate the patient to be at low risk of self harm in the current setting. - At this time, we recommend  routine. This decision is based on my review of the chart including patient's history and current presentation, interview of the patient, mental status examination, and consideration of suicide risk including evaluating suicidal ideation, plan, intent, suicidal or self-harm behaviors, risk factors, and protective factors. This judgment is based on our ability to directly address suicide risk, implement suicide prevention strategies, and develop a safety plan while the patient is in the clinical setting. Please contact our team if there is a concern that risk level has changed.  CSSR Risk Category:C-SSRS RISK CATEGORY: Error: Q7 should not  be populated when Q6 is No  Suicide Risk Assessment: Patient has following modifiable risk factors for suicide: untreated depression, under treated depression , recklessness, medication noncompliance, and lack of access to outpatient mental health resources, which we are addressing by recommending inpatient  Psychiatry hospitalization.. Patient has following non-modifiable or demographic risk factors for suicide: separation or divorce, history of suicide attempt, history of self harm behavior, and psychiatric hospitalization Patient has the following protective factors against suicide: Access to outpatient mental health care -Patient has ACT team but does not utilize the resources,  Admits not taking Medications.  Thank you for this consult request. Recommendations have been communicated to the primary team.  We will continue to round on this patient until she is admitted in a Psychiatry unit at this time.   Earney Navy, NP-PMHNP-BC       History of Present Illness  Relevant Aspects of Hospital ED Course:  Admitted on 04/30/2023 for "I need to get back on my Medication.  My parents are after me with the thing the Police zap people" Patient is a 33 years old female seen in [facility] ER system multiple times and mostly about 14 visits in March with four visits within 26 hours.  Past Psychiatry hx is documented above.  Patient in the last 24 hours have been going to [facility] ER three times in a day with c/o of her chest bursting and leaking blood.  She added she is Pregnant with twins.  Patient was evaluated and discharged three times ands she came back. Today she was brought in EMS with c/o SI and and her Medications are not effective.   Patient was discharged earlier this morning at [facility] ER.  All day  she slept and was difficult to arouse until this evening.  She woke up and asked to speak with Provider.  Patient stated that she has been going through a lot, she reports she does not know what happened to her apartment and her belongings,  she does not know where her Boyfriend is and most importantly she state her parents are after her looking to "Zap me with "the thing the Police Zap people with"  Her words were nonsensical most of our interaction.  She then stated she has not taken any of her  Medications in three weeks.  She could not state where she was yesterday, the date, year and year.  Patient reported she is going through a lot and want to get back on her Medications.  She also states she is hiding from her parents. Patient has ACT team [facility] and she states she has not seen or utilized their services in months.  She also believes they do not know where she is.  Patient denies SI/HI/but reports Auditory hallucination but cannot make out what she is hearing.  She remains Paranoid stating her parents are out to harm her. We will seek inpatient Psychiatry hospitalization.  We have resumed home Medications. Psych ROS:  Depression: yes Anxiety:  yes Mania (lifetime and current): na Psychosis: (lifetime and current): yes  Collateral information:  Contacted no contact for patient listed.  Review of Systems  Constitutional: Negative.   HENT: Negative.    Eyes: Negative.   Respiratory: Negative.    Cardiovascular: Negative.   Gastrointestinal: Negative.   Genitourinary: Negative.   Musculoskeletal: Negative.   Skin: Negative.   Neurological: Negative.   Endo/Heme/Allergies: Negative.   Psychiatric/Behavioral:  Positive for depression and suicidal ideas.  Psychiatric and Social History  Psychiatric History:  IInformation collected from Review of chart   Prev Dx/Sx: see above Current Psych Provider: Sallyanne [facility] ACT Team Home Meds (current): see above Previous Med Trials: Haldol, Vrylar, Aripirazole, Risperidone,  Therapy: yes   Prior Psych Hospitalization: Multiple times  Prior Self Harm: yes Prior Violence: yes   Family Psych History: Yes Family Hx suicide: denies   Social History:  Developmental Hx: Asperger's disease,  Educational Hx: Associate two  years degree Occupational Hx: none, unemployed Armed forces operational officer Hx: denies Living Situation: apartment Spiritual Hx: denies Access to weapons/lethal means: no    Substance History Alcohol: Denies   Type of alcohol na Last Drink na Number of drinks per day denies History of alcohol withdrawal seizures denies History of DT's denies Tobacco: denies Illicit drugs: denies-UDS Negative Prescription drug abuse: denies Rehab hx: denies Exam Findings  Physical Exam:  Vital Signs:  Temp:  [98.1 F (36.7 C)-98.9 F (37.2 C)] 98.9 F (37.2 C) (03/25 1335) Pulse Rate:  [101-124] 115 (03/25 1335) Resp:  [16-20] 18 (03/25 1335) BP: (122-137)/(74-97) 122/82 (03/25 1335) SpO2:  [91 %-99 %] 99 % (03/25 1335) Weight:  [253 kg-112.7 kg] 112 kg (03/25 1608) Blood pressure 122/82, pulse (!) 115, temperature 98.9 F (37.2 C), temperature source Oral, resp. rate 18, height 5\' 4"  (1.626 m), weight 112 kg, SpO2 99%. Body mass index is 42.38 kg/m.  Physical Exam Vitals and nursing note reviewed.  Constitutional:      Appearance: She is obese.  HENT:     Nose: Nose normal.  Cardiovascular:     Rate and Rhythm: Normal rate and regular rhythm.  Pulmonary:     Effort: Pulmonary effort is normal.  Musculoskeletal:        General: Normal range of motion.  Skin:    General: Skin is dry.  Neurological:     Mental Status: She is alert and oriented to person, place, and time.  Psychiatric:        Attention and Perception: Attention and perception normal.        Mood and Affect: Mood is depressed.        Speech: Speech is delayed and slurred.        Behavior: Behavior is cooperative.        Thought Content: Thought content is paranoid and delusional.        Cognition and Memory: Cognition is impaired. Memory is impaired.        Judgment: Judgment is impulsive.     Mental Status Exam: General Appearance: Casual, Disheveled, and obese  Orientation:  Full (Time, Place, and Person)  Memory:  Immediate;   Fair Recent;   Fair Remote;   Poor  Concentration:  Concentration: Fair and Attention Span: Fair  Recall:  Poor  Attention  Fair  Eye Contact:  Poor  Speech:  Garbled and Slurred   Language:  Fair  Volume:  Decreased  Mood: "I am going through a lot"  Affect:  Constricted  Thought Process:  Disorganized  Thought Content:  Illogical, Hallucinations: Auditory, and unable to describe what she is hearing  Suicidal Thoughts:  No  Homicidal Thoughts:  No  Judgement:  Impaired  Insight:  Good  Psychomotor Activity:  Decreased and Psychomotor Retardation  Akathisia:  NA  Fund of Knowledge:  Fair      Assets:  Manufacturing systems engineer Desire for Improvement  Cognition:  Impaired,  Mild  ADL's:  Impaired  AIMS (if indicated):  Other History   These have been pulled in through the EMR, reviewed, and updated if appropriate.  Family History:  The patient's family history includes Asthma in her father; Mental illness in her father; PDD in her brother; Seizures in her brother.  Medical History: Past Medical History:  Diagnosis Date  . Acid reflux   . Adjustment disorder with mixed anxiety and depressed mood 01/31/2022  . Adjustment disorder with mixed disturbance of emotions and conduct 08/03/2019  . Anxiety   . Asthma    last attack 03/13/15 or 03/14/15  . Autism   . Bipolar 1 disorder, depressed, severe (HCC) 07/25/2021  . 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 Chronic constipation   . Depression   . Drug-seeking behavior   . Essential tremor   . Headache   . Ineffective individual coping 05/16/2022  . Insomnia 01/12/2022  . Intentional drug overdose (HCC) 06/05/2022  . Neuromuscular disorder (HCC)   . Normocytic anemia 06/05/2022  . Overdose 07/22/2017  . Overdose of acetaminophen 07/2017   and other meds  . Overdose, intentional self-harm, initial encounter (HCC) 07/20/2021  . Paranoia (HCC) 04/22/2021  . Paroxysmal atrial fibrillation (HCC)   . Personality disorder (HCC)   . Prolonged QT interval   . Purposeful non-suicidal drug ingestion (HCC) 06/27/2021  . Schizo-affective psychosis (HCC)   . Schizoaffective disorder (HCC) 07/29/2022  .  Schizoaffective disorder, bipolar type (HCC)   . Seizures [facility])    Last seizure December 2017  . Skin erythema 04/27/2022  . Sleep apnea   . Suicidal behavior 07/25/2021  . Suicidal ideation   . Suicide (HCC) 07/01/2021  . Suicide attempt (HCC) 07/04/2021    Surgical History: Past Surgical History:  Procedure Laterality Date  . MOUTH SURGERY  2009 or 2010     Medications:   Current Facility-Administered Medications:  .  benztropine (COGENTIN) tablet 1 mg, 1 mg, Oral, QHS, Sherian Valenza C, NP .  [START ON 05/01/2023] DULoxetine (CYMBALTA) DR capsule 60 mg, 60 mg, Oral, Daily, Romone Shaff C, NP .  risperiDONE (RISPERDAL) tablet 2 mg, 2 mg, Oral, BID, Almadelia Looman C, NP .  traZODone (DESYREL) tablet 50 mg, 50 mg, Oral, QHS, Bryson Gavia C, NP  Current Outpatient Medications:  .  albuterol (VENTOLIN HFA) 108 (90 Base) MCG/ACT inhaler, Inhale 2 puffs into the lungs every 6 (six) hours as needed for wheezing or shortness of breath., Disp: , Rfl:  .  benztropine (COGENTIN) 1 MG tablet, Take 1 mg by mouth at bedtime., Disp: , Rfl:  .  DULoxetine (CYMBALTA) 60 MG capsule, Take 60 mg by mouth daily., Disp: , Rfl:  .  metFORMIN (GLUCOPHAGE) 500 MG tablet, Take 1 tablet (500 mg total) by mouth daily with breakfast. (Patient taking differently: Take 500 mg by mouth 2 (two) times daily with a meal.), Disp: , Rfl:  .  OLANZapine (ZYPREXA) 10 MG tablet, Take 10 mg by mouth at bedtime., Disp: , Rfl:  .  propafenone (RYTHMOL SR) 425 MG 12 hr capsule, Take 425 mg by mouth in the morning and at bedtime., Disp: , Rfl:  .  risperiDONE (RISPERDAL) 3 MG tablet, Take 3 mg by mouth in the morning and at bedtime., Disp: , Rfl:  .  SYMBICORT 160-4.5 MCG/ACT inhaler, Inhale 2 puffs into the lungs in the morning and at bedtime., Disp: , Rfl:  .  traZODone (DESYREL) 100 MG tablet, Take 100 mg by mouth at bedtime., Disp: , Rfl:   Allergies: Allergies  Allergen  Reactions  . Bee Venom  Anaphylaxis  . Coconut Flavoring Agent (Non-Screening) Anaphylaxis and Rash  . Fish Allergy Anaphylaxis  . Geodon [Ziprasidone Hcl] Other (See Comments)    Pt states that this medication causes paralysis of the mouth.    . Haloperidol And Related Other (See Comments)    Pt states that this medication causes paralysis of the mouth, jaw locks up  . Lithobid [Lithium] Other (See Comments)    Seizure-like activity   . Roxicodone [Oxycodone] Other (See Comments)    Hallucinations   . Seroquel [Quetiapine] Other (See Comments)    Severe drowsiness   . Shellfish Allergy Anaphylaxis  . Phenergan [Promethazine Hcl] Other (See Comments)    Chest pain    . Prilosec [Omeprazole] Nausea And Vomiting and Other (See Comments)    Pt can take protonix with no problems   . Sulfa Antibiotics Other (See Comments)    Chest pain   . Tegretol [Carbamazepine] Nausea And Vomiting  . Prozac [Fluoxetine] Other (See Comments)    Increased Depression and Suicidal thoughts  . Tape Other (See Comments)    Skin tears, can only tolerate paper tape.  . Tylenol [Acetaminophen] Rash and Other (See Comments)    Rash on face     Earney Navy, NP

## 2023-04-30 NOTE — Discharge Instructions (Signed)
 You are again seen for concern for ruptured cardiac sac.  Your physical exam does not suggest that any of this is going on.  This is likely a fixed delusion.  You need follow-up with behavioral health urgent care.

## 2023-04-30 NOTE — ED Triage Notes (Signed)
 Pt returns after recent d/c for reported "busted cardio sac"

## 2023-04-30 NOTE — BH Assessment (Addendum)
 LCSW Progress Note:   [name] MRN: [id number]  04/30/2023 8:02 PM  Per Dahlia Byes, NP, patient recommended for inpatient psychiatric hospitalization when medically cleared. Patient is under voluntary admission status at this time; please IVC if attempts to leave hospital. Patient referred to the [facility] Fransico Michael, RN) to review. Awaiting updates.   Received updates from the [name] Fransico Michael, NP), no beds available at [facility]. Advised to fax patient out to facilities for consideration of bed placement. Patient faxed to the following facilities.   Destination  Service Provider Request Status Services Address Phone Fax Patient Preferred  [facility] Pending - Request Rhina Brackett [place] [address] [phone] --  [facility] Pending - Request Sent -- [address], [place] [address] [phone] --  [facility] Pending - Request Sent -- [address], [place] [address] [phone] --  [facility] Pending - Request Sent -- [address], [place] [address] [phone] --  [facility] Pending - Request Sent -- [address] [place], [place] [address] [phone] --  [facility] (0) Pending - Request Rhina Brackett -- [address] [place] [address] [phone] --  [facility] Pending - Request Sent -- [address] [place] [address] [phone] --  [facility] Pending - Request Sent -- [address] [place], [place] [address] [phone] --  [facility] Pending - Request Sent -- [address], [place] [address] [phone] --  [facility] Pending - Request Sent -- [address] [place] [address] [phone] --  [facility] Pending - Request Sent -- [place] [phone] --  [facility] Pending - Request Sent -- [address] [place] [address] [phone] --  [facility] Pending - Request Sent -- [address] [place] [address] [phone] --  [facility] Pending - Request Sent -- [name] [place] [address] [phone] --  [facility] Pending - Request Sent -- [address] [place] [address] [phone] --  [facility] Pending - Request Sent -- [address], [place] [address] [phone] --  [facility] Pending - Request Sent -- [address], [place] [address] [phone] --  [facility] Pending - Request Sent -- [address] [place] [address] [phone] --  [facility] Pending - Request Sent -- [address] [place], [place] [address] [phone] --  [facility] Pending - Request Sent -- [address] [place] [address] [phone] --  [facility] Pending - Request Sent -- [phone] [place] [address] [phone] --  [facility] Pending - Request Sent -- [address] [place] [address] [phone] --  [facility] Pending - Request Sent -- [address] [place], [place] [phone] --  [facility] Pending - Request Sent -- [address], [place] [address] [phone] --  [facility] Pending - Request Sent -- [address] [place] [address] [phone] --  [facility] Pending -  Request Sent -- [address] [place] [address] [phone] --  [facility] Pending - Request Sent -- [address] [place], [place] [address] [phone] --  [facility] Pending - Request Sent -- [place] [phone] --  [facility] Patient Placement Pending - Request Sent -- [facility] [place] [phone] --  [facility] Pending - No Request Sent -- [place] [phone] -- --

## 2023-04-30 NOTE — ED Provider Notes (Addendum)
 Patient still has labs that are pending to medically clear her.  EKG done without any acute findings particular no widened QRS no prolonged QT.  CBC is normal other than hemoglobin 10.9.  Urine drug screen negative patient's complete metabolic panel and alcohol pending.  And pregnancy test is pending and.  Informed nursing back in the psych unit to work on getting these labs.   Evelyn Mulders, MD 04/30/23 1951  Patient's complete metabolic panel is normal glucose 107 renal function normal LFTs normal anion gap normal.  Pregnancy test negative alcohol less than 10 CBC white count 7.4 hemoglobin 10.9 hematocrit 35.9 and platelets are 267 urine drug screen is negative.  Patient is medically cleared will place TSS consult.    Evelyn Mulders, MD 04/30/23 2039  Patient is already been interviewed by behavioral health.  They have recommended keeping her on Cogentin ordering the Cymbalta trazodone and risperidone which I have done for them.    Evelyn Mulders, MD 04/30/23 2049

## 2023-04-30 NOTE — ED Provider Notes (Signed)
 [place] EMERGENCY DEPARTMENT AT [facility] Provider Note   CSN: [id number] Arrival date & time: 04/30/23  1324     History  No chief complaint on file.   [name] is a 33 y.o. female.  HPI     This is a 33 year old female who presents again with concerns for "my cardio sac bursting."  This is her third visit in 12 hours for the same complaint.  She is also been seen multiple times over the last several days.  History of schizophrenia.  When asked why she feels like she has a cardiac sac that is bursting she states "I feel a wish in my chest."  Denies chest pain or shortness of breath.  He also endorses that bursa and her knees have burst and that she is pregnant with twins.  She had a negative pregnancy test 3 days ago.  She is not taking her schizophrenia medications.  She does state that she has a place to go.  Denies SI or HI.  Home Medications Prior to Admission medications   Medication Sig Start Date End Date Taking? Authorizing Provider  albuterol (VENTOLIN HFA) 108 (90 Base) MCG/ACT inhaler Inhale 2 puffs into the lungs every 6 (six) hours as needed for wheezing or shortness of breath. 04/09/23   Ardis Hughs, NP  benztropine (COGENTIN) 1 MG tablet Take 1 mg by mouth at bedtime. 03/21/23   [provider]  DULoxetine (CYMBALTA) 60 MG capsule Take 60 mg by mouth daily.    [provider]  metFORMIN (GLUCOPHAGE) 500 MG tablet Take 1 tablet (500 mg total) by mouth daily with breakfast. Patient taking differently: Take 500 mg by mouth 2 (two) times daily with a meal. 04/10/23   Ardis Hughs, NP  OLANZapine (ZYPREXA) 10 MG tablet Take 10 mg by mouth at bedtime. 04/25/23   [provider]  propafenone (RYTHMOL SR) 425 MG 12 hr capsule Take 425 mg by mouth in the morning and at bedtime. 03/18/23   [provider]  risperiDONE (RISPERDAL) 3 MG tablet Take 3 mg by mouth in the morning and at bedtime.    [provider]  SYMBICORT 160-4.5 MCG/ACT inhaler Inhale 2 puffs into the lungs in the morning and at bedtime. 01/24/23   [provider]  traZODone (DESYREL) 100 MG tablet Take 100 mg by mouth at bedtime.    [provider]      Allergies    Bee venom, Coconut flavoring agent (non-screening), Fish allergy, Geodon [ziprasidone hcl], Haloperidol and related, Lithobid [lithium], Roxicodone [oxycodone], Seroquel [quetiapine], Shellfish allergy, Phenergan [promethazine hcl], Prilosec [omeprazole], Sulfa antibiotics, Tegretol [carbamazepine], Prozac [fluoxetine], Tape, and Tylenol [acetaminophen]    Review of Systems   Review of Systems  Respiratory:  Negative for chest tightness and shortness of breath.   Cardiovascular:  Negative for chest pain.  All other systems reviewed and are negative.   Physical Exam Updated Vital Signs BP 122/74   Pulse (!) 101   Temp 98.4 F (36.9 C) (Oral)   Resp 18   Wt 112.7 kg   LMP  (LMP Unknown)   SpO2 97%   BMI 42.65 kg/m  Physical Exam Vitals and nursing note reviewed.  Constitutional:      Appearance: She is well-developed. She is obese. She is not ill-appearing.  HENT:     Head: Normocephalic and atraumatic.  Eyes:     Pupils: Pupils are equal, round, and reactive to light.  Cardiovascular:  Rate and Rhythm: Normal rate and regular rhythm.     Heart sounds: Normal heart sounds.  Pulmonary:     Effort: Pulmonary effort is normal. No respiratory distress.     Breath sounds: No wheezing.  Abdominal:     Palpations: Abdomen is soft.  Musculoskeletal:     Cervical back: Neck supple.  Skin:    General: Skin is warm and dry.  Neurological:     Mental Status: She is alert and oriented to person, place, and time.  Psychiatric:     Comments: Flat affect, does not make eye contact     ED Results / Procedures / Treatments   Labs (all labs ordered are listed, but only abnormal results are displayed) Labs Reviewed - No  data to display  EKG None  Radiology No results found.  Procedures Procedures    Medications Ordered in ED Medications - No data to display  ED Course/ Medical Decision Making/ A&P                                 Medical Decision Making  This patient presents to the ED for concern of "cardiac sac burst", this involves an extensive number of treatment options, and is a complaint that carries with it a high risk of complications and morbidity.  I considered the following differential and admission for this acute, potentially life threatening condition.  The differential diagnosis includes psychosis, fixed delusion, less likely ACS, pericarditis  MDM:    This is a 33 year old female who has been seen 3 times in the last 12 hours for the same complaint.  She reports that she feels like her cardiac sac has burst.  She is nontoxic and vital signs are reassuring.  She is hemodynamically stable.  Her pulmonary and cardiac exams are normal.  This is likely a fixed delusion.  She has not had any SI or HI.  I have encouraged her to get back on her schizophrenia medications.  I referred her to behavioral health urgent care.  Do not feel she needs acute workup at this time.  I have reviewed her chart.  She had a full cardiac workup at [facility] on 3/17.  (Labs, imaging, consults)  Labs: I Ordered, and personally interpreted labs.  The pertinent results include: None  Imaging Studies ordered: I ordered imaging studies including none I independently visualized and interpreted imaging. I agree with the radiologist interpretation  Additional history obtained from chart review.  External records from outside source obtained and reviewed including prior labs and evaluations  Cardiac Monitoring: The patient was not maintained on a cardiac monitor.  If on the cardiac monitor, I personally viewed and interpreted the cardiac monitored which showed an underlying rhythm of:  N/A  Reevaluation: After the interventions noted above, I reevaluated the patient and found that they have :stayed the same  Social Determinants of Health:  history of schizophrenia  Disposition: Discharge  Co morbidities that complicate the patient evaluation  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 fragile X syndrome    Chronic constipation  Depression    Drug-seeking behavior    Essential tremor    Headache    Ineffective individual coping 05/16/2022   Insomnia 01/12/2022   Intentional drug overdose (HCC) 06/05/2022   Neuromuscular disorder (HCC)    Normocytic anemia 06/05/2022   Overdose 07/22/2017   Overdose of acetaminophen 07/2017   and other meds   Overdose, intentional self-harm, initial encounter (HCC) 07/20/2021   Paranoia (HCC) 04/22/2021   Paroxysmal atrial fibrillation (HCC)    Personality disorder (HCC)    Prolonged QT interval    Purposeful non-suicidal drug ingestion (HCC) 06/27/2021   Schizo-affective psychosis (HCC)    Schizoaffective disorder (HCC) 07/29/2022   Schizoaffective disorder, bipolar type (HCC)    Seizures (HCC)    Last seizure December 2017   Skin erythema 04/27/2022   Sleep apnea    Suicidal behavior 07/25/2021   Suicidal ideation    Suicide (HCC) 07/01/2021   Suicide attempt (HCC) 07/04/2021     Medicines No orders of the defined types were placed in this encounter.   I have reviewed the patients home medicines and have made adjustments as needed  Problem List / ED Course: Problem List Items Addressed This Visit   None Visit Diagnoses       Delusion (HCC)    -  Primary                   Final Clinical  Impression(s) / ED Diagnoses Final diagnoses:  Delusion [facility])    Rx / DC Orders ED Discharge Orders     None         Delynn Olvera, Mayer Masker, MD 04/30/23 [phone]

## 2023-05-01 DIAGNOSIS — F22 Delusional disorders: Secondary | ICD-10-CM | POA: Diagnosis not present

## 2023-05-01 NOTE — ED Notes (Signed)
 Patient may have placement at [facility] after 7am.

## 2023-05-01 NOTE — ED Provider Notes (Signed)
 Emergency Medicine Observation Re-evaluation Note  [name] is a 33 y.o. female, seen on rounds today.  Pt initially presented to the ED for complaints of "peri-cardial leakage" Currently, the patient is medically cleared and awaiting psychiatric evaluation.  Presented for a "swishing" sensation in her chest and had work up completed at [facility], was medically cleared and reported SI. Marland Kitchen  Physical Exam  BP 123/71 (BP Location: Left Arm)   Pulse (!) 105   Temp 98.4 F (36.9 C) (Oral)   Resp 18   Ht 5\' 4"  (1.626 m)   Wt 112 kg   LMP  (LMP Unknown)   SpO2 98%   BMI 42.38 kg/m  Physical Exam General: NAD Cardiac: RR Lungs: even unlabored Psych: n/a  ED Course / MDM  EKG:EKG Interpretation Date/Time:  Tuesday April 30 2023 19:05:17 EDT Ventricular Rate:  105 PR Interval:  128 QRS Duration:  84 QT Interval:  354 QTC Calculation: 467 R Axis:   61  Text Interpretation: Sinus tachycardia Inferior infarct , age undetermined Abnormal ECG When compared with ECG of 15-Apr-2023 03:03, PREVIOUS ECG IS PRESENT No significant change since last tracing Confirmed by Vanetta Mulders [phone]) on 04/30/2023 7:16:19 PM  I have reviewed the labs performed to date as well as medications administered while in observation.  Recent changes in the last 24 hours include none.  Plan  Current plan is for inpatient psychiatric admission.    Alvira Monday, MD 05/01/23 [phone]

## 2023-05-01 NOTE — Progress Notes (Signed)
 Pt has been accepted to [facility] TODAY 05/01/2023 Bed assignment: Main campus  Pt meets inpatient criteria per: Dahlia Byes NP  Attending Physician will be Loni Beckwith, MD  Report can be called to: [phone] (this is a pager, please leave call-back number when giving report)  Pt can arrive ASAP   Care Team Notified: Rosalie Gums, Jacquelynn Cree NT, Romero Belling RN  [place] Parada LCSW-A   05/01/2023 9:28 AM

## 2023-05-01 NOTE — ED Notes (Signed)
 Safe Transport en route.

## 2023-05-01 NOTE — ED Notes (Signed)
 Report given to Varney Biles, RN at [facility].

## 2023-05-01 NOTE — ED Notes (Signed)
 Left vm for [facility] to cb to receive report.

## 2023-05-07 ENCOUNTER — Encounter (HOSPITAL_COMMUNITY): Payer: Self-pay

## 2023-05-07 ENCOUNTER — Other Ambulatory Visit: Payer: Self-pay

## 2023-05-07 ENCOUNTER — Emergency Department (HOSPITAL_COMMUNITY)
Admission: EM | Admit: 2023-05-07 | Discharge: 2023-05-07 | Disposition: A | Discharge status: Home / Self Care | Attending: Emergency Medicine | Admitting: Emergency Medicine

## 2023-05-07 ENCOUNTER — Emergency Department (HOSPITAL_COMMUNITY)

## 2023-05-07 DIAGNOSIS — R079 Chest pain, unspecified: Secondary | ICD-10-CM | POA: Diagnosis present

## 2023-05-07 DIAGNOSIS — R072 Precordial pain: Secondary | ICD-10-CM | POA: Insufficient documentation

## 2023-05-07 LAB — BASIC METABOLIC PANEL WITH GFR
Anion gap: 9 (ref 5–15)
BUN: 9 mg/dL (ref 6–20)
CO2: 20 mmol/L — ABNORMAL LOW (ref 22–32)
Calcium: 9 mg/dL (ref 8.9–10.3)
Chloride: 107 mmol/L (ref 98–111)
Creatinine, Ser: 0.74 mg/dL (ref 0.44–1.00)
GFR, Estimated: >60 mL/min (ref >60)
Glucose, Bld: 98 mg/dL (ref 70–99)
Potassium: 3.9 mmol/L (ref 3.5–5.1)
Sodium: 136 mmol/L (ref 135–145)

## 2023-05-07 LAB — CBC
HCT: 36.5 % (ref 36.0–46.0)
Hemoglobin: 11.3 g/dL — ABNORMAL LOW (ref 12.0–15.0)
MCH: 24.9 pg — ABNORMAL LOW (ref 26.0–34.0)
MCHC: 31 g/dL (ref 30.0–36.0)
MCV: 80.6 fL (ref 80.0–100.0)
Platelets: 288 10*3/uL (ref 150–400)
RBC: 4.53 MIL/uL (ref 3.87–5.11)
RDW: 14.9 % (ref 11.5–15.5)
WBC: 8 10*3/uL (ref 4.0–10.5)
nRBC: 0 % (ref 0.0–0.2)

## 2023-05-07 LAB — TROPONIN I (HIGH SENSITIVITY): Troponin I (High Sensitivity): <2 ng/L (ref <18)

## 2023-05-07 MED ORDER — ALUM & MAG HYDROXIDE-SIMETH 200-200-20 MG/5ML PO SUSP
30.0000 mL | Freq: Once | ORAL | Status: AC
  Administered 2023-05-07: 30 mL via ORAL
  Filled 2023-05-07: qty 30

## 2023-05-07 MED ORDER — FAMOTIDINE 20 MG PO TABS
20.0000 mg | ORAL_TABLET | Freq: Once | ORAL | Status: AC
  Administered 2023-05-07: 20 mg via ORAL
  Filled 2023-05-07: qty 1

## 2023-05-07 NOTE — ED Triage Notes (Signed)
 States that something is burning her heart through her back. Also that her lung is collapsing. Becomes tearful in triage. States that she has done nothing wrong. Denies HI/SI/ AVH

## 2023-05-07 NOTE — Discharge Instructions (Signed)
 It was our pleasure to provide your ER care today - we hope that you feel better.  Overall, your labs/heart tests look good.   Follow up closely with primary care doctor and behavioral health provider in the coming week.  For mental health issues and/or crisis, you may also go directly to the [facility] - they are open 24/7 and walk-ins are welcome.    Return to ER if worse, new symptoms, fevers, chest pain, trouble breathing, or other emergency concern.

## 2023-05-07 NOTE — ED Provider Notes (Signed)
 [place] EMERGENCY DEPARTMENT AT [facility] Provider Note   CSN: [id number] Arrival date & time: 05/07/23  1005     History  Chief Complaint  Patient presents with   Chest Pain    [name] is a 33 y.o. female.  Pt c/o mid to left chest pain in past 1-2 days. Constant, at rest, non radiating, not pleuritic. No trauma or fall. No associated nausea, vomiting, or diaphoresis. No sob. Denies new or worsening cough. No fever or chills. No heartburn. Denies hx cad or fam hx cad. No extremity pain or swelling. No hx dvt or pe. Pt limited historian - level 5 caveat.   The history is provided by the patient and medical records. The history is limited by the condition of the patient.  Chest Pain Associated symptoms: no abdominal pain, no back pain, no cough, no fever, no headache, no nausea, no palpitations, no shortness of breath and no vomiting        Home Medications Prior to Admission medications   Medication Sig Start Date End Date Taking? Authorizing Provider  albuterol (VENTOLIN HFA) 108 (90 Base) MCG/ACT inhaler Inhale 2 puffs into the lungs every 6 (six) hours as needed for wheezing or shortness of breath. 04/09/23   Ardis Hughs, NP  benztropine (COGENTIN) 1 MG tablet Take 1 mg by mouth at bedtime. 03/21/23   [provider]  DULoxetine (CYMBALTA) 60 MG capsule Take 60 mg by mouth daily.    [provider]  metFORMIN (GLUCOPHAGE) 500 MG tablet Take 1 tablet (500 mg total) by mouth daily with breakfast. Patient taking differently: Take 500 mg by mouth 2 (two) times daily with a meal. 04/10/23   Ardis Hughs, NP  OLANZapine (ZYPREXA) 10 MG tablet Take 10 mg by mouth at bedtime. 04/25/23   [provider]  propafenone (RYTHMOL SR) 425 MG 12 hr capsule Take 425 mg by mouth in the morning and at bedtime. 03/18/23   [provider]  risperiDONE (RISPERDAL) 3 MG tablet Take 3 mg by mouth in the morning and at bedtime.     [provider]  SYMBICORT 160-4.5 MCG/ACT inhaler Inhale 2 puffs into the lungs in the morning and at bedtime. 01/24/23   [provider]  traZODone (DESYREL) 100 MG tablet Take 100 mg by mouth at bedtime.    [provider]      Allergies    Bee venom, Coconut flavoring agent (non-screening), Fish allergy, Geodon [ziprasidone hcl], Haloperidol and related, Lithobid [lithium], Roxicodone [oxycodone], Seroquel [quetiapine], Shellfish allergy, Phenergan [promethazine hcl], Prilosec [omeprazole], Sulfa antibiotics, Tegretol [carbamazepine], Prozac [fluoxetine], Tape, and Tylenol [acetaminophen]    Review of Systems   Review of Systems  Constitutional:  Negative for chills and fever.  HENT:  Negative for sore throat.   Respiratory:  Negative for cough and shortness of breath.   Cardiovascular:  Positive for chest pain. Negative for palpitations and leg swelling.  Gastrointestinal:  Negative for abdominal pain, nausea and vomiting.  Genitourinary:  Negative for flank pain.  Musculoskeletal:  Negative for back pain and neck pain.  Skin:  Negative for rash.  Neurological:  Negative for headaches.    Physical Exam Updated Vital Signs BP 131/86 (BP Location: Right Arm)   Pulse 94   Temp 98.5 F (36.9 C) (Oral)   Resp 16   Ht 1.626 m (5\' 4" )   Wt 113.4 kg   LMP  (LMP Unknown)   SpO2 97%   BMI  42.91 kg/m  Physical Exam Vitals and nursing note reviewed.  Constitutional:      Appearance: Normal appearance. She is well-developed.  HENT:     Head: Atraumatic.     Nose: Nose normal.     Mouth/Throat:     Mouth: Mucous membranes are moist.  Eyes:     General: No scleral icterus.    Conjunctiva/sclera: Conjunctivae normal.  Neck:     Trachea: No tracheal deviation.  Cardiovascular:     Rate and Rhythm: Normal rate and regular rhythm.     Pulses: Normal pulses.     Heart sounds: Normal heart sounds. No murmur heard.    No friction rub. No gallop.   Pulmonary:     Effort: Pulmonary effort is normal. No respiratory distress.     Breath sounds: Normal breath sounds.     Comments: Mild left chest wall tenderness. No sts. No skin lesions/rash.  Abdominal:     General: Bowel sounds are normal. There is no distension.     Palpations: Abdomen is soft.     Tenderness: There is no abdominal tenderness.  Genitourinary:    Comments: No cva tenderness.  Musculoskeletal:        General: No swelling or tenderness.     Cervical back: Normal range of motion and neck supple. No rigidity or tenderness. No muscular tenderness.     Right lower leg: No edema.     Left lower leg: No edema.  Skin:    General: Skin is warm and dry.     Findings: No rash.  Neurological:     Mental Status: She is alert.     Comments: Alert, speech normal.   Psychiatric:        Mood and Affect: Mood normal.     ED Results / Procedures / Treatments   Labs (all labs ordered are listed, but only abnormal results are displayed) Results for orders placed or performed during the hospital encounter of 05/07/23  CBC   Collection Time: 05/07/23 11:37 AM  Result Value Ref Range   WBC 8.0 4.0 - 10.5 K/uL   RBC 4.53 3.87 - 5.11 MIL/uL   Hemoglobin 11.3 (L) 12.0 - 15.0 g/dL   HCT 16.1 09.6 - 04.5 %   MCV 80.6 80.0 - 100.0 fL   MCH 24.9 (L) 26.0 - 34.0 pg   MCHC 31.0 30.0 - 36.0 g/dL   RDW 40.9 81.1 - 91.4 %   Platelets 288 150 - 400 K/uL   nRBC 0.0 0.0 - 0.2 %  Basic metabolic panel with GFR   Collection Time: 05/07/23 11:37 AM  Result Value Ref Range   Sodium 136 135 - 145 mmol/L   Potassium 3.9 3.5 - 5.1 mmol/L   Chloride 107 98 - 111 mmol/L   CO2 20 (L) 22 - 32 mmol/L   Glucose, Bld 98 70 - 99 mg/dL   BUN 9 6 - 20 mg/dL   Creatinine, Ser 7.82 0.44 - 1.00 mg/dL   Calcium 9.0 8.9 - 95.6 mg/dL   GFR, Estimated >21 >30 mL/min   Anion gap 9 5 - 15  Troponin I (High Sensitivity)   Collection Time: 05/07/23 11:37 AM  Result Value Ref Range   Troponin I (High  Sensitivity) <2 <18 ng/L   DG Chest 2 View Result Date: 04/15/2023 CLINICAL DATA:  Dyspnea EXAM: CHEST - 2 VIEW COMPARISON:  None Available. FINDINGS: The heart size and mediastinal contours are within normal limits. Both lungs are clear.  The visualized skeletal structures are unremarkable. IMPRESSION: No active cardiopulmonary disease. Electronically Signed   By: Helyn Numbers M.D.   On: 04/15/2023 03:08   DG Chest 2 View Result Date: 04/13/2023 CLINICAL DATA:  Chest pain for 2 weeks. EXAM: CHEST - 2 VIEW COMPARISON:  January 22, 2023 FINDINGS: The heart size and mediastinal contours are within normal limits. A radiopaque monitor is seen overlying the mid left lung. Both lungs are clear. The visualized skeletal structures are unremarkable. IMPRESSION: No active cardiopulmonary disease. Electronically Signed   By: Aram Candela M.D.   On: 04/13/2023 22:40     EKG EKG Interpretation Date/Time:  Tuesday May 07 2023 10:15:41 EDT Ventricular Rate:  94 PR Interval:  148 QRS Duration:  93 QT Interval:  368 QTC Calculation: 461 R Axis:   84  Text Interpretation: Sinus rhythm No significant change since last tracing Confirmed by Cathren Laine ([id number]) on 05/07/2023 1:18:55 PM  Radiology No results found.  Procedures Procedures    Medications Ordered in ED Medications - No data to display  ED Course/ Medical Decision Making/ A&P                                 Medical Decision Making Problems Addressed: Precordial chest pain: acute illness or injury with systemic symptoms that poses a threat to life or bodily functions  Amount and/or Complexity of Data Reviewed External Data Reviewed: notes. Labs: ordered. Decision-making details documented in ED Course. Radiology: ordered. ECG/medicine tests: ordered and independent interpretation performed. Decision-making details documented in ED Course.  Risk OTC drugs. Decision regarding hospitalization.   Iv ns. Continuous pulse ox  and cardiac monitoring. Labs ordered/sent. Imaging ordered.   Differential diagnosis includes acs, msk cp, gi cp, etc. Dispo decision including potential need for admission considered - will get labs and imaging and reassess.   Reviewed nursing notes and prior charts for additional history. External reports reviewed.  Pepcid po, maalox po.   Cardiac monitor: sinus rhythm, rate 94.  Labs reviewed/interpreted by me - wbc and hct normal. Trop normal - after constant symptoms for past 1-2 days, felt not c/w acs.   Xrays reviewed/interpreted by me - pt refuses.   Recheck pt comfortable, no acute distress, normal vitals. Pt currently appears stable for ED d/c.   Rec close pcp f/u, as well as close f/u with her [facility] provider.   Return precautions provided.          Final Clinical Impression(s) / ED Diagnoses Final diagnoses:  Precordial chest pain    Rx / DC Orders ED Discharge Orders     None         Cathren Laine, MD 05/07/23 1355

## 2023-05-13 NOTE — Progress Notes (Signed)
 Psych medical clearance protocol

## 2023-06-17 ENCOUNTER — Emergency Department (HOSPITAL_COMMUNITY)
Admission: EM | Admit: 2023-06-17 | Discharge: 2023-06-17 | Disposition: A | Discharge status: Home / Self Care | Attending: Emergency Medicine | Admitting: Emergency Medicine

## 2023-06-17 ENCOUNTER — Emergency Department (HOSPITAL_COMMUNITY)

## 2023-06-17 ENCOUNTER — Other Ambulatory Visit: Payer: Self-pay

## 2023-06-17 DIAGNOSIS — R079 Chest pain, unspecified: Secondary | ICD-10-CM | POA: Diagnosis present

## 2023-06-17 DIAGNOSIS — R0602 Shortness of breath: Secondary | ICD-10-CM

## 2023-06-17 LAB — COMPREHENSIVE METABOLIC PANEL WITH GFR
ALT: 22 U/L (ref 0–44)
AST: 21 U/L (ref 15–41)
Albumin: 3.7 g/dL (ref 3.5–5.0)
Alkaline Phosphatase: 68 U/L (ref 38–126)
Anion gap: 9 (ref 5–15)
BUN: 5 mg/dL — ABNORMAL LOW (ref 6–20)
CO2: 27 mmol/L (ref 22–32)
Calcium: 9.1 mg/dL (ref 8.9–10.3)
Chloride: 102 mmol/L (ref 98–111)
Creatinine, Ser: 0.77 mg/dL (ref 0.44–1.00)
GFR, Estimated: >60 mL/min (ref >60)
Glucose, Bld: 112 mg/dL — ABNORMAL HIGH (ref 70–99)
Potassium: 3.6 mmol/L (ref 3.5–5.1)
Sodium: 138 mmol/L (ref 135–145)
Total Bilirubin: 0.5 mg/dL (ref 0.0–1.2)
Total Protein: 7 g/dL (ref 6.5–8.1)

## 2023-06-17 LAB — CBC
HCT: 37.4 % (ref 36.0–46.0)
Hemoglobin: 11.6 g/dL — ABNORMAL LOW (ref 12.0–15.0)
MCH: 24.9 pg — ABNORMAL LOW (ref 26.0–34.0)
MCHC: 31 g/dL (ref 30.0–36.0)
MCV: 80.3 fL (ref 80.0–100.0)
Platelets: 275 10*3/uL (ref 150–400)
RBC: 4.66 MIL/uL (ref 3.87–5.11)
RDW: 14.9 % (ref 11.5–15.5)
WBC: 9.6 10*3/uL (ref 4.0–10.5)
nRBC: 0 % (ref 0.0–0.2)

## 2023-06-17 LAB — RAPID URINE DRUG SCREEN, HOSP PERFORMED
Amphetamines: NOT DETECTED
Barbiturates: NOT DETECTED
Benzodiazepines: NOT DETECTED
Cocaine: NOT DETECTED
Opiates: NOT DETECTED
Tetrahydrocannabinol: NOT DETECTED

## 2023-06-17 LAB — TROPONIN I (HIGH SENSITIVITY)
Troponin I (High Sensitivity): 5 ng/L (ref <18)
Troponin I (High Sensitivity): 3 ng/L (ref <18)

## 2023-06-17 LAB — HCG, SERUM, QUALITATIVE: Preg, Serum: NEGATIVE

## 2023-06-17 LAB — ETHANOL: Alcohol, Ethyl (B): <15 mg/dL (ref <15)

## 2023-06-17 NOTE — ED Provider Notes (Signed)
 Patient's troponins patient's care assumed at 6:30 AM.  Patient has a troponin pending.  Patient has been complaining of chest pain.  Second troponin returned and is negative.  I discussed results with patient.  There was some concern expressed by nurse that patient had mentioned suicidal thoughts.  Patient denies any current suicidal thoughts no suicidal plans no homicidal ideas.  Patient reports that she is safe at home and wants to return home.  Patient is followed by ACT team.  She agrees to contact the ACT team.  She is willing to return to the hospital if she has any thoughts of self-harm.  Patient is discharged in stable condition.   Sandi Crosby, PA-C 06/17/23 0981    Tonya Fredrickson, MD 06/17/23 [phone]

## 2023-06-17 NOTE — ED Provider Notes (Signed)
 [place] [facility]ENC[facility] DEPARTMENT AT [facility] Provider Note   CSN: [id number] Arrival date & time: 06/17/23  0325     History  Chief Complaint  Patient presents with   Chest Pain   Hallucinations    [name] is a 33 y.o. female with extensive medical history to include schizoaffective disorder, adjustment disorder, insomnia, bipolar 1 disorder, suicidal behavior, purposeful nonsuicidal drug ingestion, paranoia, anxiety.  Patient presents to ED for evaluation of chest pain and shortness of breath.  Reports that her symptoms began about 3 hours ago while she was experiencing anxiety.  States anxiety is related to familial issues.  She reports chest pain is located centrally, does not radiate.  She is unsure of an exertional component.  She is unsure of her last menstrual cycle.  She denies lightheadedness, dizziness, weakness, leg swelling, nausea, vomiting, abdominal pain.  Patient apparently complained to triage nurse that she "wanted to hurt herself".  Patient denies any kind of suicidal ideation to me.  Denies homicidal ideation, audio hallucination, visual hallucination.  Denies drug or alcohol  use tonight.   Chest Pain Associated symptoms: shortness of breath        Home Medications Prior to Admission medications   Medication Sig Start Date End Date Taking? Authorizing Provider  albuterol  (VENTOLIN  HFA) 108 (90 Base) MCG/ACT inhaler Inhale 2 puffs into the lungs every 6 (six) hours as needed for wheezing or shortness of breath. 04/09/23   Costella Dirks, NP  benztropine  (COGENTIN ) 1 MG tablet Take 1 mg by mouth at bedtime. 03/21/23   [provider]  DULoxetine  (CYMBALTA ) 60 MG capsule Take 60 mg by mouth daily.    [provider]  metFORMIN  (GLUCOPHAGE ) 500 MG tablet Take 1 tablet (500 mg total) by mouth daily with breakfast. Patient taking differently: Take 500 mg by mouth 2 (two) times daily with a meal. 04/10/23   Costella Dirks,  NP  OLANZapine  (ZYPREXA ) 10 MG tablet Take 10 mg by mouth at bedtime. 04/25/23   [provider]  propafenone  (RYTHMOL  SR) 425 MG 12 hr capsule Take 425 mg by mouth in the morning and at bedtime. 03/18/23   [provider]  risperiDONE  (RISPERDAL ) 3 MG tablet Take 3 mg by mouth in the morning and at bedtime.    [provider]  SYMBICORT 160-4.5 MCG/ACT inhaler Inhale 2 puffs into the lungs in the morning and at bedtime. 01/24/23   [provider]  traZODone  (DESYREL ) 100 MG tablet Take 100 mg by mouth at bedtime.    [provider]      Allergies    Bee venom, Coconut flavoring agent (non-screening), Fish allergy, Geodon  [ziprasidone  hcl], Haloperidol  and related, Lithobid [lithium ], Roxicodone  [oxycodone ], Seroquel  [quetiapine ], Shellfish allergy, Phenergan  [promethazine  hcl], Prilosec [omeprazole], Sulfa antibiotics, Tegretol  [carbamazepine ], Prozac  [fluoxetine ], Tape, and Tylenol  [acetaminophen ]    Review of Systems   Review of Systems  Respiratory:  Positive for shortness of breath.   Cardiovascular:  Positive for chest pain.  All other systems reviewed and are negative.   Physical Exam Updated Vital Signs BP 112/68   Pulse 86   Temp 98 F (36.7 C) (Oral)   Resp 18   Ht 5\' 5"  (1.651 m)   Wt 108.9 kg   SpO2 99%   BMI 39.94 kg/m  Physical Exam Vitals and nursing note reviewed.  Constitutional:      General: She is not in acute distress.    Appearance: She is well-developed.  HENT:  Head: Normocephalic and atraumatic.  Eyes:     Conjunctiva/sclera: Conjunctivae normal.  Cardiovascular:     Rate and Rhythm: Normal rate and regular rhythm.     Heart sounds: No murmur heard. Pulmonary:     Effort: Pulmonary effort is normal. No respiratory distress.     Breath sounds: Normal breath sounds.  Abdominal:     Palpations: Abdomen is soft.     Tenderness: There is no abdominal tenderness.  Musculoskeletal:        General: No  swelling.     Cervical back: Neck supple.  Skin:    General: Skin is warm and dry.     Capillary Refill: Capillary refill takes less than 2 seconds.  Neurological:     Mental Status: She is alert.  Psychiatric:        Mood and Affect: Mood normal.     ED Results / Procedures / Treatments   Labs (all labs ordered are listed, but only abnormal results are displayed) Labs Reviewed  COMPREHENSIVE METABOLIC PANEL WITH GFR - Abnormal; Notable for the following components:      Result Value   Glucose, Bld 112 (*)    BUN 5 (*)    All other components within normal limits  CBC - Abnormal; Notable for the following components:   Hemoglobin 11.6 (*)    MCH 24.9 (*)    All other components within normal limits  ETHANOL  RAPID URINE DRUG SCREEN, HOSP PERFORMED  HCG, SERUM, QUALITATIVE  TROPONIN I (HIGH SENSITIVITY)  TROPONIN I (HIGH SENSITIVITY)    EKG None  Radiology No results found.  Procedures Procedures    Medications Ordered in ED Medications - No data to display  ED Course/ Medical Decision Making/ A&P  Medical Decision Making Amount and/or Complexity of Data Reviewed Labs: ordered.   33 year old female presents for evaluation.  Please see HPI for further details.  On examination the patient is afebrile and nontachycardic.  Initially she was tachycardic with a pulse rate of 101 however on reexamination, her pulse rate is normalized.  Lung sounds are clear bilaterally, not hypoxic.  Abdomen soft and compressible.  Neurological examination at baseline.  Overall nontoxic in appearance with reassuring vital signs.  She denies SI, HI.  She denies any audiovisual hallucination however she did report this to triage nurse.  She is alert and oriented x 4.  Patient labwork initiated in triage include CBC, CMP, hCG, ethanol, rapid urine drug screen, EKG.  Will add on troponin, chest x-ray.  CBC without leukocytosis, baseline hemoglobin 11.6.  Metabolic panel unremarkable.   Rapid urine drug screen negative for all.  Ethanol undetectable.  hCG negative.  Troponin pending at this time.  EKG nonischemic.  Patient has had numerous D-dimers, CTAs in the past which were negative for PE.  She is overall nontoxic in appearance.  She is PERC negative besides arriving with slight tachycardia at 101.  Doubt PE based on Wells criteria.  At end of shift, patient workup complete.  Signed out to oncoming provider Marcia Setters, PA-C.  Final Clinical Impression(s) / ED Diagnoses Final diagnoses:  Chest pain, unspecified type  Shortness of breath    Rx / DC Orders ED Discharge Orders     None         Kristin Peyer 06/17/23 1610    Roberts Ching, MD 06/18/23 [phone]

## 2023-06-17 NOTE — ED Notes (Signed)
 Wanded by security

## 2023-06-17 NOTE — Discharge Instructions (Signed)
 Follow up with your Physician for recheck.  Return if any problems.
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
## 2023-06-17 NOTE — ED Notes (Addendum)
 Patient dressed out in burgundy  scrubs and belongings put in locker # 4

## 2023-06-18 ENCOUNTER — Emergency Department (HOSPITAL_COMMUNITY)
Admission: EM | Admit: 2023-06-18 | Discharge: 2023-06-18 | Disposition: A | Discharge status: Home / Self Care | Attending: Emergency Medicine | Admitting: Emergency Medicine

## 2023-06-18 ENCOUNTER — Encounter (HOSPITAL_COMMUNITY): Payer: Self-pay

## 2023-06-18 DIAGNOSIS — D72829 Elevated white blood cell count, unspecified: Secondary | ICD-10-CM | POA: Diagnosis not present

## 2023-06-18 DIAGNOSIS — R42 Dizziness and giddiness: Secondary | ICD-10-CM | POA: Diagnosis present

## 2023-06-18 DIAGNOSIS — Z7984 Long term (current) use of oral hypoglycemic drugs: Secondary | ICD-10-CM | POA: Diagnosis not present

## 2023-06-18 DIAGNOSIS — Z79899 Other long term (current) drug therapy: Secondary | ICD-10-CM | POA: Diagnosis not present

## 2023-06-18 DIAGNOSIS — E860 Dehydration: Secondary | ICD-10-CM | POA: Diagnosis not present

## 2023-06-18 DIAGNOSIS — R000 Tachycardia, unspecified: Secondary | ICD-10-CM | POA: Insufficient documentation

## 2023-06-18 DIAGNOSIS — R82998 Other abnormal findings in urine: Secondary | ICD-10-CM

## 2023-06-18 DIAGNOSIS — R531 Weakness: Secondary | ICD-10-CM

## 2023-06-18 DIAGNOSIS — R55 Syncope and collapse: Secondary | ICD-10-CM | POA: Diagnosis not present

## 2023-06-18 LAB — D-DIMER, QUANTITATIVE: D-Dimer, Quant: <0.27 ug{FEU}/mL (ref 0.00–0.50)

## 2023-06-18 LAB — URINALYSIS, ROUTINE W REFLEX MICROSCOPIC
Bilirubin Urine: NEGATIVE
Glucose, UA: NEGATIVE mg/dL
Hgb urine dipstick: NEGATIVE
Ketones, ur: NEGATIVE mg/dL
Nitrite: NEGATIVE
Protein, ur: NEGATIVE mg/dL
Specific Gravity, Urine: 1.017 (ref 1.005–1.030)
pH: 6 (ref 5.0–8.0)

## 2023-06-18 LAB — RAPID URINE DRUG SCREEN, HOSP PERFORMED
Amphetamines: NOT DETECTED
Barbiturates: NOT DETECTED
Benzodiazepines: NOT DETECTED
Cocaine: NOT DETECTED
Opiates: NOT DETECTED
Tetrahydrocannabinol: NOT DETECTED

## 2023-06-18 LAB — COMPREHENSIVE METABOLIC PANEL WITH GFR
ALT: 20 U/L (ref 0–44)
AST: 20 U/L (ref 15–41)
Albumin: 3.2 g/dL — ABNORMAL LOW (ref 3.5–5.0)
Alkaline Phosphatase: 67 U/L (ref 38–126)
Anion gap: 8 (ref 5–15)
BUN: <5 mg/dL — ABNORMAL LOW (ref 6–20)
CO2: 24 mmol/L (ref 22–32)
Calcium: 8.7 mg/dL — ABNORMAL LOW (ref 8.9–10.3)
Chloride: 106 mmol/L (ref 98–111)
Creatinine, Ser: 0.66 mg/dL (ref 0.44–1.00)
GFR, Estimated: >60 mL/min (ref >60)
Glucose, Bld: 90 mg/dL (ref 70–99)
Potassium: 3.8 mmol/L (ref 3.5–5.1)
Sodium: 138 mmol/L (ref 135–145)
Total Bilirubin: 0.5 mg/dL (ref 0.0–1.2)
Total Protein: 6.4 g/dL — ABNORMAL LOW (ref 6.5–8.1)

## 2023-06-18 LAB — CBC
HCT: 35.1 % — ABNORMAL LOW (ref 36.0–46.0)
Hemoglobin: 10.8 g/dL — ABNORMAL LOW (ref 12.0–15.0)
MCH: 25.1 pg — ABNORMAL LOW (ref 26.0–34.0)
MCHC: 30.8 g/dL (ref 30.0–36.0)
MCV: 81.4 fL (ref 80.0–100.0)
Platelets: 255 10*3/uL (ref 150–400)
RBC: 4.31 MIL/uL (ref 3.87–5.11)
RDW: 15 % (ref 11.5–15.5)
WBC: 8.5 10*3/uL (ref 4.0–10.5)
nRBC: 0 % (ref 0.0–0.2)

## 2023-06-18 LAB — TROPONIN I (HIGH SENSITIVITY): Troponin I (High Sensitivity): <2 ng/L (ref <18)

## 2023-06-18 LAB — ACETAMINOPHEN LEVEL: Acetaminophen (Tylenol), Serum: <10 ug/mL — ABNORMAL LOW (ref 10–30)

## 2023-06-18 LAB — PREGNANCY, URINE: Preg Test, Ur: NEGATIVE

## 2023-06-18 LAB — SALICYLATE LEVEL: Salicylate Lvl: <7 mg/dL — ABNORMAL LOW (ref 7.0–30.0)

## 2023-06-18 MED ORDER — CEPHALEXIN 500 MG PO CAPS: 500.0000 mg | ORAL_CAPSULE | Freq: Three times a day (TID) | ORAL | 0 refills | Status: DC

## 2023-06-18 MED ORDER — LACTATED RINGERS IV BOLUS
1000.0000 mL | Freq: Once | INTRAVENOUS | Status: AC
  Administered 2023-06-18: 999 mL via INTRAVENOUS

## 2023-06-18 MED ORDER — LACTATED RINGERS IV BOLUS
1000.0000 mL | Freq: Once | INTRAVENOUS | Status: AC
  Administered 2023-06-18: 1000 mL via INTRAVENOUS

## 2023-06-18 MED ORDER — SODIUM CHLORIDE 0.9 % IV SOLN
1.0000 g | Freq: Once | INTRAVENOUS | Status: AC
  Administered 2023-06-18: 1 g via INTRAVENOUS
  Filled 2023-06-18: qty 10

## 2023-06-18 NOTE — ED Notes (Addendum)
 Patient refused to eat states she has food at home but did accept fluids and tolerated well. Dinner tray at bedside

## 2023-06-18 NOTE — ED Provider Notes (Signed)
 [place] EMERGENCY DEPARTMENT AT [facility] Provider Note   CSN: [id number] Arrival date & time: 06/18/23  1532     History  Chief Complaint  Patient presents with   Near Syncope   Weakness   Dizziness    Orthostatic hypotension upon standing     [name] is a 33 y.o. female.  Patient c/o generally weak, feeling faint, friend called EMS, was noted to have low bp in 80s and heart rate in low 100s. Pt indicates has felt similarly in past day, not sure of cause. Denies other recent syncope. Denies any fever, chills or sweats. No chest pain or discomfort (although recent ED visit for chest pain). No sob or unusual doe. No headache. No change in speech or vision. No new numbness or weakness or loss of normal functional ability. Denies trauma/fall. No palpitations. No cough or uri symptoms. No fever or chills. No abd pain or nvd. Is eating/drinking. Denies rectal bleeding or any blood loss, no melena. No dysuria or gu c/o. No extremity pain or swelling. No new meds or change in meds. No wt change. Pt indicates is on blood pressure meds (not listed on current med list, ?cozaar ?minipress ), but denies taking an extra of her meds or any new meds, or change in doses.   The history is provided by the patient and medical records.  Near Syncope Pertinent negatives include no chest pain, no abdominal pain, no headaches and no shortness of breath.  Weakness Associated symptoms: dizziness and near-syncope   Associated symptoms: no abdominal pain, no chest pain, no cough, no diarrhea, no dysuria, no fever, no headaches, no shortness of breath and no vomiting   Dizziness Associated symptoms: weakness   Associated symptoms: no blood in stool, no chest pain, no diarrhea, no headaches, no palpitations, no shortness of breath and no vomiting        Home Medications Prior to Admission medications   Medication Sig Start Date End Date Taking? Authorizing Provider  cephALEXin   (KEFLEX ) 500 MG capsule Take 1 capsule (500 mg total) by mouth 3 (three) times daily. 06/18/23  Yes Guadalupe Lee, MD  albuterol  (VENTOLIN  HFA) 108 (90 Base) MCG/ACT inhaler Inhale 2 puffs into the lungs every 6 (six) hours as needed for wheezing or shortness of breath. 04/09/23   Costella Dirks, NP  benztropine  (COGENTIN ) 1 MG tablet Take 1 mg by mouth at bedtime. 03/21/23   [provider]  DULoxetine  (CYMBALTA ) 60 MG capsule Take 60 mg by mouth daily.    [provider]  metFORMIN  (GLUCOPHAGE ) 500 MG tablet Take 1 tablet (500 mg total) by mouth daily with breakfast. Patient taking differently: Take 500 mg by mouth 2 (two) times daily with a meal. 04/10/23   Costella Dirks, NP  OLANZapine  (ZYPREXA ) 10 MG tablet Take 10 mg by mouth at bedtime. 04/25/23   [provider]  propafenone  (RYTHMOL  SR) 425 MG 12 hr capsule Take 425 mg by mouth in the morning and at bedtime. 03/18/23   [provider]  risperiDONE  (RISPERDAL ) 3 MG tablet Take 3 mg by mouth in the morning and at bedtime.    [provider]  SYMBICORT 160-4.5 MCG/ACT inhaler Inhale 2 puffs into the lungs in the morning and at bedtime. 01/24/23   [provider]  traZODone  (DESYREL ) 100 MG tablet Take 100 mg by mouth at bedtime.    [provider]      Allergies    Bee venom, Coconut  flavoring agent (non-screening), Fish allergy, Geodon  [ziprasidone  hcl], Haloperidol  and related, Lithobid [lithium ], Roxicodone  [oxycodone ], Seroquel  [quetiapine ], Shellfish allergy, Phenergan  [promethazine  hcl], Prilosec [omeprazole], Sulfa antibiotics, Tegretol  [carbamazepine ], Prozac  [fluoxetine ], Tape, and Tylenol  [acetaminophen ]    Review of Systems   Review of Systems  Constitutional:  Negative for chills and fever.  HENT:  Negative for nosebleeds and sore throat.   Eyes:  Negative for pain, redness and visual disturbance.  Respiratory:  Negative for cough and shortness of breath.    Cardiovascular:  Positive for near-syncope. Negative for chest pain, palpitations and leg swelling.  Gastrointestinal:  Negative for abdominal pain, blood in stool, diarrhea and vomiting.  Endocrine: Negative for polyuria.  Genitourinary:  Negative for dysuria and flank pain.  Musculoskeletal:  Negative for back pain and neck pain.  Skin:  Negative for rash.  Neurological:  Positive for dizziness, weakness and light-headedness. Negative for speech difficulty, numbness and headaches.  Psychiatric/Behavioral:  Negative for confusion.     Physical Exam Updated Vital Signs BP 128/78 (BP Location: Left Arm)   Pulse 89   Temp 97.7 F (36.5 C) (Oral)   Resp 14   Ht 1.651 m (5\' 5" )   Wt 103.9 kg   SpO2 100%   BMI 38.11 kg/m  Physical Exam Vitals and nursing note reviewed.  Constitutional:      Appearance: Normal appearance. She is well-developed.  HENT:     Head: Atraumatic.     Nose: Nose normal.     Mouth/Throat:     Mouth: Mucous membranes are moist.  Eyes:     General: No scleral icterus.    Conjunctiva/sclera: Conjunctivae normal.     Pupils: Pupils are equal, round, and reactive to light.  Neck:     Vascular: No carotid bruit.     Trachea: No tracheal deviation.     Comments: Trachea midline. Thyroid  not grossly enlarged or tender. No neck stiffness or rigidity.  Cardiovascular:     Rate and Rhythm: Normal rate and regular rhythm.     Pulses: Normal pulses.     Heart sounds: Normal heart sounds. No murmur heard.    No friction rub. No gallop.  Pulmonary:     Effort: Pulmonary effort is normal. No respiratory distress.     Breath sounds: Normal breath sounds.  Abdominal:     General: Bowel sounds are normal. There is no distension.     Palpations: Abdomen is soft. There is no mass.     Tenderness: There is no abdominal tenderness. There is no guarding.  Genitourinary:    Comments: No cva tenderness.  Musculoskeletal:        General: No swelling or tenderness.      Cervical back: Normal range of motion and neck supple. No rigidity or tenderness. No muscular tenderness.     Right lower leg: No edema.     Left lower leg: No edema.  Lymphadenopathy:     Cervical: No cervical adenopathy.  Skin:    General: Skin is warm and dry.     Findings: No rash.  Neurological:     Mental Status: She is alert.     Comments: Alert, speech normal. Motor/sens grossly intact bil. Steady gait.   Psychiatric:        Mood and Affect: Mood normal.     ED Results / Procedures / Treatments   Labs (all labs ordered are listed, but only abnormal results are displayed) Results for orders placed or performed during the hospital encounter  of 06/18/23  CBC   Collection Time: 06/18/23  4:24 PM  Result Value Ref Range   WBC 8.5 4.0 - 10.5 K/uL   RBC 4.31 3.87 - 5.11 MIL/uL   Hemoglobin 10.8 (L) 12.0 - 15.0 g/dL   HCT 29.5 (L) 62.1 - 30.8 %   MCV 81.4 80.0 - 100.0 fL   MCH 25.1 (L) 26.0 - 34.0 pg   MCHC 30.8 30.0 - 36.0 g/dL   RDW 65.7 84.6 - 96.2 %   Platelets 255 150 - 400 K/uL   nRBC 0.0 0.0 - 0.2 %  Comprehensive metabolic panel with GFR   Collection Time: 06/18/23  4:24 PM  Result Value Ref Range   Sodium 138 135 - 145 mmol/L   Potassium 3.8 3.5 - 5.1 mmol/L   Chloride 106 98 - 111 mmol/L   CO2 24 22 - 32 mmol/L   Glucose, Bld 90 70 - 99 mg/dL   BUN <5 (L) 6 - 20 mg/dL   Creatinine, Ser 9.52 0.44 - 1.00 mg/dL   Calcium  8.7 (L) 8.9 - 10.3 mg/dL   Total Protein 6.4 (L) 6.5 - 8.1 g/dL   Albumin 3.2 (L) 3.5 - 5.0 g/dL   AST 20 15 - 41 U/L   ALT 20 0 - 44 U/L   Alkaline Phosphatase 67 38 - 126 U/L   Total Bilirubin 0.5 0.0 - 1.2 mg/dL   GFR, Estimated >84 >13 mL/min   Anion gap 8 5 - 15  Urinalysis, Routine w reflex microscopic -Urine, Clean Catch   Collection Time: 06/18/23  4:24 PM  Result Value Ref Range   Color, Urine YELLOW YELLOW   APPearance HAZY (A) CLEAR   Specific Gravity, Urine 1.017 1.005 - 1.030   pH 6.0 5.0 - 8.0   Glucose, UA NEGATIVE  NEGATIVE mg/dL   Hgb urine dipstick NEGATIVE NEGATIVE   Bilirubin Urine NEGATIVE NEGATIVE   Ketones, ur NEGATIVE NEGATIVE mg/dL   Protein, ur NEGATIVE NEGATIVE mg/dL   Nitrite NEGATIVE NEGATIVE   Leukocytes,Ua TRACE (A) NEGATIVE   RBC / HPF 0-5 0 - 5 RBC/hpf   WBC, UA 11-20 0 - 5 WBC/hpf   Bacteria, UA RARE (A) NONE SEEN   Squamous Epithelial / HPF 6-10 0 - 5 /HPF   Mucus PRESENT   Rapid urine drug screen (hospital performed)   Collection Time: 06/18/23  4:24 PM  Result Value Ref Range   Opiates NONE DETECTED NONE DETECTED   Cocaine NONE DETECTED NONE DETECTED   Benzodiazepines NONE DETECTED NONE DETECTED   Amphetamines NONE DETECTED NONE DETECTED   Tetrahydrocannabinol NONE DETECTED NONE DETECTED   Barbiturates NONE DETECTED NONE DETECTED  Pregnancy, urine   Collection Time: 06/18/23  4:24 PM  Result Value Ref Range   Preg Test, Ur NEGATIVE NEGATIVE  Acetaminophen  level   Collection Time: 06/18/23  4:24 PM  Result Value Ref Range   Acetaminophen  (Tylenol ), Serum <10 (L) 10 - 30 ug/mL  Salicylate level   Collection Time: 06/18/23  4:24 PM  Result Value Ref Range   Salicylate Lvl <7.0 (L) 7.0 - 30.0 mg/dL  D-dimer, quantitative   Collection Time: 06/18/23  4:24 PM  Result Value Ref Range   D-Dimer, Quant <0.27 0.00 - 0.50 ug/mL-FEU  Troponin I (High Sensitivity)   Collection Time: 06/18/23  4:24 PM  Result Value Ref Range   Troponin I (High Sensitivity) <2 <18 ng/L   DG Chest Portable 1 View Result Date: 06/17/2023 CLINICAL DATA:  Chest pain and  shortness of breath. EXAM: PORTABLE CHEST 1 VIEW COMPARISON:  Two-view chest x-ray 3/10/5. FINDINGS: The lung volumes lordotic positioning exaggerate the heart size. Mild pulmonary vascular congestion is present without frank edema. No focal airspace disease present. No effusion or pneumothorax is present. The visualized soft tissues and bony thorax are unremarkable. IMPRESSION: Mild pulmonary vascular congestion without frank edema.  Electronically Signed   By: Audree Leas M.D.   On: 06/17/2023 07:20     EKG EKG Interpretation Date/Time:  Tuesday Jun 18 2023 15:57:57 EDT Ventricular Rate:  112 PR Interval:  126 QRS Duration:  83 QT Interval:  341 QTC Calculation: 466 R Axis:   68  Text Interpretation: Sinus tachycardia Low voltage, precordial leads Nonspecific T wave abnormality Confirmed by Guadalupe Lee ([id number]) on 06/18/2023 4:14:38 PM  Radiology DG Chest Portable 1 View Result Date: 06/17/2023 CLINICAL DATA:  Chest pain and shortness of breath. EXAM: PORTABLE CHEST 1 VIEW COMPARISON:  Two-view chest x-ray 3/10/5. FINDINGS: The lung volumes lordotic positioning exaggerate the heart size. Mild pulmonary vascular congestion is present without frank edema. No focal airspace disease present. No effusion or pneumothorax is present. The visualized soft tissues and bony thorax are unremarkable. IMPRESSION: Mild pulmonary vascular congestion without frank edema. Electronically Signed   By: Audree Leas M.D.   On: 06/17/2023 07:20    Procedures Procedures    Medications Ordered in ED Medications  lactated ringers  bolus 1,000 mL (0 mLs Intravenous Stopped 06/18/23 1744)  lactated ringers  bolus 1,000 mL (999 mLs Intravenous New Bag/Given 06/18/23 1744)  cefTRIAXone  (ROCEPHIN ) 1 g in sodium chloride  0.9 % 100 mL IVPB (0 g Intravenous Stopped 06/18/23 1911)    ED Course/ Medical Decision Making/ A&P                                 Medical Decision Making Problems Addressed: Dehydration: acute illness or injury with systemic symptoms that poses a threat to life or bodily functions Generalized weakness: acute illness or injury with systemic symptoms Near syncope: acute illness or injury with systemic symptoms that poses a threat to life or bodily functions Urine white blood cells increased: acute illness or injury  Amount and/or Complexity of Data Reviewed Independent Historian: EMS    Details:  hx External Data Reviewed: labs and notes. Labs: ordered. Decision-making details documented in ED Course. Radiology: ordered and independent interpretation performed. Decision-making details documented in ED Course. ECG/medicine tests: ordered and independent interpretation performed. Decision-making details documented in ED Course.  Risk Prescription drug management. Decision regarding hospitalization.   Iv ns. Continuous pulse ox and cardiac monitoring. Labs ordered/sent. Imaging ordered.   Differential diagnosis includes dehydration, anemia, etc. Dispo decision including potential need for admission considered - will get labs and imaging and reassess.   Reviewed nursing notes and prior charts for additional history. External reports reviewed. Additional history from: EMS.   LR bolus.  Cardiac monitor: sinus rhythm, rate 106.  Labs reviewed/interpreted by me - wbc normal. Hct 35. Chem largely unremarkable. Preg neg. Ddimer normal and trop normal - after symptoms for past day, felt not c/w acs or PE.   UA w 11-20 wbc, ?possible uti, rocephin  iv.   Xrays reviewed/interpreted by me - no pna.   Po fluids, food, ambulate in hall.   Bp is normal. Vitals normal.   Pt currently appears stable for ED d/c.   Rec close pcp f/u.  Return precautions provided.  CRITICAL CARE RE: hypotension, near syncope, volume depletion Performed by: Fareeha Evon E [place] Lamons Total critical care time: 40 minutes Critical care time was exclusive of separately billable procedures and treating other patients. Critical care was necessary to treat or prevent imminent or life-threatening deterioration. Critical care was time spent personally by me on the following activities: development of treatment plan with patient and/or surrogate as well as nursing, discussions with consultants, evaluation of patient's response to treatment, examination of patient, obtaining history from patient or surrogate, ordering and  performing treatments and interventions, ordering and review of laboratory studies, ordering and review of radiographic studies, pulse oximetry and re-evaluation of patient's condition.         Final Clinical Impression(s) / ED Diagnoses Final diagnoses:  Generalized weakness  Near syncope  Urine white blood cells increased  Dehydration    Rx / DC Orders ED Discharge Orders          Ordered    cephALEXin  (KEFLEX ) 500 MG capsule  3 times daily        06/18/23 2002              Guadalupe Lee, MD 06/18/23 2004

## 2023-06-18 NOTE — ED Triage Notes (Signed)
 Pt. BIB ems for low BP of 80/50, near syncope episode, tachycardia HR 130s.

## 2023-06-18 NOTE — Discharge Instructions (Addendum)
 It was our pleasure to provide your ER care today - we hope that you feel better.  Drink plenty of fluids/stay well hydrated. The lab tests show a possible urine infection - take antibiotic (keflex ) as prescribed.   Follow up closely with primary care doctor in the next 2-3 days. For now, hold on taking any blood pressure medication.   Return to ER if worse, new symptoms, fevers, new/severe pain, chest pain, trouble breathing, fainting, or other concern.

## 2023-06-18 NOTE — ED Notes (Signed)
 Patient denies pain and is resting comfortably.

## 2023-06-18 NOTE — ED Notes (Signed)
 Patient d/c with home care instructions. IV discontinued.

## 2023-06-24 ENCOUNTER — Emergency Department (HOSPITAL_COMMUNITY)
Admission: EM | Admit: 2023-06-24 | Discharge: 2023-06-24 | Discharge status: Against Medical Advice | Attending: Emergency Medicine | Admitting: Emergency Medicine

## 2023-06-24 ENCOUNTER — Encounter (HOSPITAL_COMMUNITY): Payer: Self-pay | Admitting: Emergency Medicine

## 2023-06-24 ENCOUNTER — Other Ambulatory Visit: Payer: Self-pay

## 2023-06-24 DIAGNOSIS — R079 Chest pain, unspecified: Secondary | ICD-10-CM | POA: Insufficient documentation

## 2023-06-24 DIAGNOSIS — R000 Tachycardia, unspecified: Secondary | ICD-10-CM | POA: Insufficient documentation

## 2023-06-24 DIAGNOSIS — Z5321 Procedure and treatment not carried out due to patient leaving prior to being seen by health care provider: Secondary | ICD-10-CM | POA: Insufficient documentation

## 2023-06-24 NOTE — ED Notes (Signed)
 Pt left waiting room and transport asked pt if they wanted to be seen. Pt stated "I am discharging myself". Pt seen leaving by lobby staff and transport.

## 2023-06-24 NOTE — ED Triage Notes (Signed)
 [place]. Sinus tach on cardiac monitor. Pt reports prolonged heat exposure outside "waiting for her ride." Hx of anxiety and angina.

## 2023-07-05 ENCOUNTER — Encounter (HOSPITAL_COMMUNITY): Payer: Self-pay

## 2023-07-05 ENCOUNTER — Emergency Department (HOSPITAL_COMMUNITY)
Admission: EM | Admit: 2023-07-05 | Discharge: 2023-07-05 | Discharge status: Against Medical Advice | Attending: Emergency Medicine | Admitting: Emergency Medicine

## 2023-07-05 ENCOUNTER — Other Ambulatory Visit: Payer: Self-pay

## 2023-07-05 DIAGNOSIS — Z5321 Procedure and treatment not carried out due to patient leaving prior to being seen by health care provider: Secondary | ICD-10-CM | POA: Diagnosis not present

## 2023-07-05 DIAGNOSIS — R443 Hallucinations, unspecified: Secondary | ICD-10-CM | POA: Diagnosis present
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
## 2023-07-05 NOTE — ED Notes (Signed)
 The patient is not sitting in the wheelchair at [facility]. Pt seen walking out of the ED with independent steady gait.

## 2023-07-13 ENCOUNTER — Other Ambulatory Visit: Payer: Self-pay

## 2023-07-13 ENCOUNTER — Inpatient Hospital Stay (HOSPITAL_COMMUNITY)
Admission: EM | Admit: 2023-07-13 | Discharge: 2023-07-17 | DRG: 918 | Disposition: A | Discharge status: Other Acute Inpatient Hospital | Attending: Student | Admitting: Student

## 2023-07-13 ENCOUNTER — Encounter (HOSPITAL_COMMUNITY): Payer: Self-pay | Admitting: Internal Medicine

## 2023-07-13 DIAGNOSIS — F250 Schizoaffective disorder, bipolar type: Secondary | ICD-10-CM

## 2023-07-13 DIAGNOSIS — D638 Anemia in other chronic diseases classified elsewhere: Secondary | ICD-10-CM | POA: Diagnosis present

## 2023-07-13 DIAGNOSIS — F845 Asperger's syndrome: Secondary | ICD-10-CM | POA: Diagnosis present

## 2023-07-13 DIAGNOSIS — T443X2A Poisoning by other parasympatholytics [anticholinergics and antimuscarinics] and spasmolytics, intentional self-harm, initial encounter: Secondary | ICD-10-CM | POA: Diagnosis present

## 2023-07-13 DIAGNOSIS — T446X2A Poisoning by alpha-adrenoreceptor antagonists, intentional self-harm, initial encounter: Secondary | ICD-10-CM | POA: Diagnosis present

## 2023-07-13 DIAGNOSIS — Z87891 Personal history of nicotine dependence: Secondary | ICD-10-CM | POA: Diagnosis not present

## 2023-07-13 DIAGNOSIS — Z9152 Personal history of nonsuicidal self-harm: Secondary | ICD-10-CM

## 2023-07-13 DIAGNOSIS — T426X2A Poisoning by other antiepileptic and sedative-hypnotic drugs, intentional self-harm, initial encounter: Secondary | ICD-10-CM | POA: Diagnosis present

## 2023-07-13 DIAGNOSIS — Z7984 Long term (current) use of oral hypoglycemic drugs: Secondary | ICD-10-CM | POA: Diagnosis not present

## 2023-07-13 DIAGNOSIS — F600 Paranoid personality disorder: Secondary | ICD-10-CM | POA: Diagnosis present

## 2023-07-13 DIAGNOSIS — Z91030 Bee allergy status: Secondary | ICD-10-CM

## 2023-07-13 DIAGNOSIS — Z91048 Other nonmedicinal substance allergy status: Secondary | ICD-10-CM

## 2023-07-13 DIAGNOSIS — T50902A Poisoning by unspecified drugs, medicaments and biological substances, intentional self-harm, initial encounter: Principal | ICD-10-CM | POA: Diagnosis present

## 2023-07-13 DIAGNOSIS — Z148 Genetic carrier of other disease: Secondary | ICD-10-CM | POA: Diagnosis not present

## 2023-07-13 DIAGNOSIS — T1491XA Suicide attempt, initial encounter: Secondary | ICD-10-CM

## 2023-07-13 DIAGNOSIS — F411 Generalized anxiety disorder: Secondary | ICD-10-CM | POA: Diagnosis present

## 2023-07-13 DIAGNOSIS — I480 Paroxysmal atrial fibrillation: Secondary | ICD-10-CM | POA: Diagnosis present

## 2023-07-13 DIAGNOSIS — R7303 Prediabetes: Secondary | ICD-10-CM | POA: Diagnosis present

## 2023-07-13 DIAGNOSIS — T447X2A Poisoning by beta-adrenoreceptor antagonists, intentional self-harm, initial encounter: Principal | ICD-10-CM | POA: Diagnosis present

## 2023-07-13 DIAGNOSIS — Z79899 Other long term (current) drug therapy: Secondary | ICD-10-CM

## 2023-07-13 DIAGNOSIS — F603 Borderline personality disorder: Secondary | ICD-10-CM | POA: Diagnosis present

## 2023-07-13 DIAGNOSIS — Y92009 Unspecified place in unspecified non-institutional (private) residence as the place of occurrence of the external cause: Secondary | ICD-10-CM

## 2023-07-13 DIAGNOSIS — Z9151 Personal history of suicidal behavior: Secondary | ICD-10-CM

## 2023-07-13 DIAGNOSIS — Z6838 Body mass index (BMI) 38.0-38.9, adult: Secondary | ICD-10-CM

## 2023-07-13 DIAGNOSIS — Z882 Allergy status to sulfonamides status: Secondary | ICD-10-CM

## 2023-07-13 DIAGNOSIS — Z825 Family history of asthma and other chronic lower respiratory diseases: Secondary | ICD-10-CM

## 2023-07-13 DIAGNOSIS — G4730 Sleep apnea, unspecified: Secondary | ICD-10-CM | POA: Diagnosis present

## 2023-07-13 DIAGNOSIS — F4310 Post-traumatic stress disorder, unspecified: Secondary | ICD-10-CM | POA: Diagnosis present

## 2023-07-13 DIAGNOSIS — T50902D Poisoning by unspecified drugs, medicaments and biological substances, intentional self-harm, subsequent encounter: Secondary | ICD-10-CM | POA: Diagnosis not present

## 2023-07-13 DIAGNOSIS — Z91013 Allergy to seafood: Secondary | ICD-10-CM

## 2023-07-13 DIAGNOSIS — G40909 Epilepsy, unspecified, not intractable, without status epilepticus: Secondary | ICD-10-CM | POA: Diagnosis present

## 2023-07-13 DIAGNOSIS — T50901A Poisoning by unspecified drugs, medicaments and biological substances, accidental (unintentional), initial encounter: Secondary | ICD-10-CM | POA: Diagnosis present

## 2023-07-13 DIAGNOSIS — Z7951 Long term (current) use of inhaled steroids: Secondary | ICD-10-CM

## 2023-07-13 DIAGNOSIS — T383X2A Poisoning by insulin and oral hypoglycemic [antidiabetic] drugs, intentional self-harm, initial encounter: Secondary | ICD-10-CM | POA: Diagnosis present

## 2023-07-13 DIAGNOSIS — E876 Hypokalemia: Secondary | ICD-10-CM | POA: Diagnosis present

## 2023-07-13 DIAGNOSIS — K219 Gastro-esophageal reflux disease without esophagitis: Secondary | ICD-10-CM | POA: Diagnosis present

## 2023-07-13 DIAGNOSIS — J4540 Moderate persistent asthma, uncomplicated: Secondary | ICD-10-CM | POA: Diagnosis present

## 2023-07-13 DIAGNOSIS — Z818 Family history of other mental and behavioral disorders: Secondary | ICD-10-CM

## 2023-07-13 DIAGNOSIS — Z886 Allergy status to analgesic agent status: Secondary | ICD-10-CM

## 2023-07-13 DIAGNOSIS — F3112 Bipolar disorder, current episode manic without psychotic features, moderate: Secondary | ICD-10-CM

## 2023-07-13 DIAGNOSIS — G4700 Insomnia, unspecified: Secondary | ICD-10-CM | POA: Diagnosis present

## 2023-07-13 LAB — COMPREHENSIVE METABOLIC PANEL WITH GFR
ALT: 18 U/L (ref 0–44)
AST: 20 U/L (ref 15–41)
Albumin: 3.7 g/dL (ref 3.5–5.0)
Alkaline Phosphatase: 65 U/L (ref 38–126)
Anion gap: 10 (ref 5–15)
BUN: <5 mg/dL — ABNORMAL LOW (ref 6–20)
CO2: 22 mmol/L (ref 22–32)
Calcium: 9.1 mg/dL (ref 8.9–10.3)
Chloride: 109 mmol/L (ref 98–111)
Creatinine, Ser: 0.68 mg/dL (ref 0.44–1.00)
GFR, Estimated: >60 mL/min (ref >60)
Glucose, Bld: 110 mg/dL — ABNORMAL HIGH (ref 70–99)
Potassium: 3.6 mmol/L (ref 3.5–5.1)
Sodium: 141 mmol/L (ref 135–145)
Total Bilirubin: 0.3 mg/dL (ref 0.0–1.2)
Total Protein: 6.9 g/dL (ref 6.5–8.1)

## 2023-07-13 LAB — ETHANOL: Alcohol, Ethyl (B): <15 mg/dL (ref <15)

## 2023-07-13 LAB — URINALYSIS, ROUTINE W REFLEX MICROSCOPIC
Bilirubin Urine: NEGATIVE
Glucose, UA: NEGATIVE mg/dL
Hgb urine dipstick: NEGATIVE
Ketones, ur: NEGATIVE mg/dL
Leukocytes,Ua: NEGATIVE
Nitrite: NEGATIVE
Protein, ur: NEGATIVE mg/dL
Specific Gravity, Urine: 1.016 (ref 1.005–1.030)
pH: 5 (ref 5.0–8.0)

## 2023-07-13 LAB — CBC WITH DIFFERENTIAL/PLATELET
Abs Immature Granulocytes: 0.03 10*3/uL (ref 0.00–0.07)
Basophils Absolute: 0.1 10*3/uL (ref 0.0–0.1)
Basophils Relative: 1 %
Eosinophils Absolute: 0.1 10*3/uL (ref 0.0–0.5)
Eosinophils Relative: 1 %
HCT: 37.2 % (ref 36.0–46.0)
Hemoglobin: 11.5 g/dL — ABNORMAL LOW (ref 12.0–15.0)
Immature Granulocytes: 0 %
Lymphocytes Relative: 33 %
Lymphs Abs: 2.5 10*3/uL (ref 0.7–4.0)
MCH: 25.2 pg — ABNORMAL LOW (ref 26.0–34.0)
MCHC: 30.9 g/dL (ref 30.0–36.0)
MCV: 81.4 fL (ref 80.0–100.0)
Monocytes Absolute: 0.5 10*3/uL (ref 0.1–1.0)
Monocytes Relative: 6 %
Neutro Abs: 4.5 10*3/uL (ref 1.7–7.7)
Neutrophils Relative %: 59 %
Platelets: 263 10*3/uL (ref 150–400)
RBC: 4.57 MIL/uL (ref 3.87–5.11)
RDW: 14.8 % (ref 11.5–15.5)
WBC: 7.6 10*3/uL (ref 4.0–10.5)
nRBC: 0 % (ref 0.0–0.2)

## 2023-07-13 LAB — RAPID URINE DRUG SCREEN, HOSP PERFORMED
Amphetamines: NOT DETECTED
Barbiturates: NOT DETECTED
Benzodiazepines: NOT DETECTED
Cocaine: NOT DETECTED
Opiates: NOT DETECTED
Tetrahydrocannabinol: NOT DETECTED

## 2023-07-13 LAB — ACETAMINOPHEN LEVEL
Acetaminophen (Tylenol), Serum: <10 ug/mL — ABNORMAL LOW (ref 10–30)
Acetaminophen (Tylenol), Serum: <10 ug/mL — ABNORMAL LOW (ref 10–30)

## 2023-07-13 LAB — LACTIC ACID, PLASMA: Lactic Acid, Venous: 2.1 mmol/L (ref 0.5–1.9)

## 2023-07-13 LAB — HCG, SERUM, QUALITATIVE: Preg, Serum: NEGATIVE

## 2023-07-13 LAB — MAGNESIUM: Magnesium: 1.5 mg/dL — ABNORMAL LOW (ref 1.7–2.4)

## 2023-07-13 LAB — SALICYLATE LEVEL: Salicylate Lvl: <7 mg/dL — ABNORMAL LOW (ref 7.0–30.0)

## 2023-07-13 MED ORDER — MAGNESIUM SULFATE 2 GM/50ML IV SOLN
2.0000 g | Freq: Once | INTRAVENOUS | Status: AC
  Administered 2023-07-13: 2 g via INTRAVENOUS
  Filled 2023-07-13: qty 50

## 2023-07-13 MED ORDER — IBUPROFEN 200 MG PO TABS: 400.0000 mg | ORAL_TABLET | Freq: Four times a day (QID) | ORAL | Status: DC | PRN

## 2023-07-13 MED ORDER — SODIUM CHLORIDE 0.9 % IV BOLUS
1000.0000 mL | Freq: Once | INTRAVENOUS | Status: AC
  Administered 2023-07-13: 1000 mL via INTRAVENOUS

## 2023-07-13 MED ORDER — CHARCOAL ACTIVATED PO LIQD
100.0000 g | Freq: Once | ORAL | Status: AC
  Administered 2023-07-13: 100 g via ORAL
  Filled 2023-07-13: qty 480

## 2023-07-13 MED ORDER — PANTOPRAZOLE SODIUM 40 MG PO TBEC
40.0000 mg | DELAYED_RELEASE_TABLET | Freq: Every day | ORAL | Status: DC
  Administered 2023-07-13 – 2023-07-17 (×3): 40 mg via ORAL
  Filled 2023-07-13 (×4): qty 1

## 2023-07-13 MED ORDER — POTASSIUM CHLORIDE CRYS ER 20 MEQ PO TBCR
40.0000 meq | EXTENDED_RELEASE_TABLET | Freq: Once | ORAL | Status: AC
  Administered 2023-07-13: 40 meq via ORAL
  Filled 2023-07-13: qty 2

## 2023-07-13 MED ORDER — SODIUM CHLORIDE 0.9 % IV SOLN
INTRAVENOUS | Status: DC
  Administered 2023-07-14: 1000 mL via INTRAVENOUS

## 2023-07-13 MED ORDER — POLYETHYLENE GLYCOL 3350 17 G PO PACK: 17.0000 g | PACK | Freq: Every day | ORAL | Status: DC | PRN

## 2023-07-13 NOTE — ED Triage Notes (Signed)
 At 6am pt was having SI and took unk amount of metformin  and metoprolol . Pt is in process of losing custody of children. Denies ETOH and drug use. Pt with emesis x1 on arrival

## 2023-07-13 NOTE — ED Notes (Signed)
IVC paperwork in process 

## 2023-07-13 NOTE — ED Notes (Signed)
 Called and placed patient on monitor and confirmed with CCMD-Monique,RN

## 2023-07-13 NOTE — Consult Note (Addendum)
 [facility] Psychiatric Consult Initial  Patient Name: .[name]  MRN: [id number]  DOB: 1990-09-26  Consult Order details:  Orders (From admission, onward)     Start     Ordered   07/13/23 1139  IP CONSULT TO PSYCHIATRY       Ordering Provider: Heddy Liverpool, NP  Provider:  (Not yet assigned)  Question Answer Comment  Location [facility]   Reason for Consult? Suicidal Attempt via Intentional Overdose      07/13/23 1138             Mode of Visit: In person    Psychiatry Consult Evaluation  Service Date: July 13, 2023 LOS:  LOS: 0 days  Chief Complaint: "I just tried kill myself"  Primary Psychiatric Diagnoses  Borderline personality disorder (HCC) 2. Schizoaffective disorder, bipolar type [facility])  Assessment   [name] is a 33 y.o. Caucasian female with a past psychiatric history of PTSD, schizoaffective disorder, borderline personality disorder, and GAD, with additional past and pertinent psychiatric history of multiple encounters for delusions and attempted overdose, as well as historical and current use of ACT team services through [facility], with additional past and pertinent medical history/comorbidities of obesity, hyperprolactinemia, paroxysmal A-fib, and seizure disorder, who presented this encounter by way of EMS, after attempting to overdose on the patient's outpatient prescribed indications, in the context of psychosocial stress of potentially losing custody of her daughter.  Patient currently remains not medically clear, still following poison control protocols, with plans to be admitted to the medical floor, and is under involuntary commitment.  Upon evaluation conducted, patient presents with symptomology that is most consistent with the patient's chronic illness courses of borderline personality disorder and schizoaffective disorder bipolar type.  Evidence of this is appreciable from evaluation conducted, where the  patient endorses that upon hearing from her parents that they are close in removing custody of her daughter from her, abruptly and impulsively took several handfuls of her home medications, in an attempt to commit suicide, as well as the patient's endorsements of severe paranoia around believing that her parents are considering having her killed.  Given the patient's recent suicide attempt which led to this encounter, the patient's struggles with severe affective instability, continued active thoughts of wanting to commit suicide, homicidal ideations with plan and intent to kill her parents, and paranoid ideations of believing that her parents want to have her killed, recommendation is for inpatient mental health hospitalization, for safety and stabilization of the patient.  Will refrain from restarting the patient's current outpatient regimen at this time, given the severity of the overdose attempt which led to this encounter not too long ago, though do recommend continuing these medications when appropriate.  Diagnoses:  Active Hospital problems: Principal Problem:   Suicide attempt by drug ingestion [facility]) Active Problems:   Borderline personality disorder (HCC)   Schizoaffective disorder, bipolar type (HCC)    Plan   #Borderline personality disorder (HCC) #Schizoaffective disorder, bipolar type (HCC)  ## Psychiatric Medication Recommendations:   -Recommend holding the patient's current outpatient psychiatric medications at this time  ## Medical Decision Making Capacity: Not specifically addressed in this encounter  ## Further Work-up: Poison control measures  ## Disposition:--Recommend inpatient mental health hospitalization upon medical clearance, patient currently is involuntarily committed  ## Behavioral / Environmental: -Strict agitation/safety precautions    ## Safety and Observation Level:  - Based on my clinical evaluation, I estimate the patient to be at moderate  risk of  self harm in the current setting. - At this time, we recommend  1:1 Observation. This decision is based on my review of the chart including patient's history and current presentation, interview of the patient, mental status examination, and consideration of suicide risk including evaluating suicidal ideation, plan, intent, suicidal or self-harm behaviors, risk factors, and protective factors. This judgment is based on our ability to directly address suicide risk, implement suicide prevention strategies, and develop a safety plan while the patient is in the clinical setting. Please contact our team if there is a concern that risk level has changed.  CSSR Risk Category:C-SSRS RISK CATEGORY: High Risk  Suicide Risk Assessment: Patient has following modifiable risk factors for suicide: active suicidal ideation, under treated depression , and recklessness, which we are addressing by treatment recommendations. Patient has following non-modifiable or demographic risk factors for suicide: separation or divorce, history of suicide attempt, history of self harm behavior, and psychiatric hospitalization Patient has the following protective factors against suicide: Access to outpatient mental health care and Minor children in the home  Thank you for this consult request. Recommendations have been communicated to the primary team.  We will continue to follow at this time.   Volanda Gruber, NP       History of Present Illness   [name] is a 33 y.o. Caucasian female with a past psychiatric history of PTSD, schizoaffective disorder, borderline personality disorder, and GAD, with additional past and pertinent psychiatric history of multiple encounters for delusions and attempted overdose, as well as historical and current use of ACT team services through [facility], with additional past and pertinent medical history/comorbidities of obesity, hyperprolactinemia, paroxysmal A-fib, and seizure  disorder, who presented this encounter by way of EMS, after attempting to overdose on the patient's outpatient prescribed indications, in the context of psychosocial stress of potentially losing custody of her daughter.  Patient currently remains not medically clear, still following poison control protocols, with plans to be admitted to the medical floor, and is under involuntary commitment.  Patient seen today at the [facility] emergency department for face-to-face psychiatric evaluation.  Upon evaluation, patient endorses that earlier today she attempted to end her life by way of drug overdose on her prescribed outpatient medications, in the context of her parents telling her over the phone that they were almost finished with the process of taking custody away from her over her daughter.  Patient endorses that the suicide attempt took place approximately around 8 or 9 AM, and affirms EDP report of taking about 3 handfuls of her prescribed medications.  Patient endorses current physical symptoms from attempted overdose of, mild myalgias, nausea, and some fatigue, but otherwise no appreciable more severe symptomology.  Expanding on recent conversation over the phone with her parents about potentially having custody taken away from her as a relates to her daughter, patient states that this has been going on for many months now, but states that after today's phone call, appears that, "I might just finally lose my baby girl".  Patient denies any other psychosocial stressors, states additionally that she is compliant with her outpatient prescribed medications through her [facility] team, and states that they normally work well for her, as well as states that she is also compliant and consistent with participating in therapy through her ACT team, and that it is helpful.  Patient endorses that her mood is formally depressed, and presents with a sad and tearful interpersonal style  and affect that is  congruent, and with minimal eye contact.  Patient endorses that she remains actively suicidal, but notably has no plan, and endorses that she is actively homicidal and wanting to stab her parents.  Patient endorses no auditory and or visual hallucinations, and objectively, initially does not appear to be presenting with psychotic features, though when asked about paranoid ideations, patient endorses in a serious manner, that she is very worried that her parents are actively working to have her killed.  Patient orientation is intact, no concerns for fluctuations in consciousness.  Patient endorses no EtOH use, drug use, and or tobacco use, shortly but appropriately denies any history of these, though chart reveals small history historically of tobacco use.  UDS appreciably negative, BAL unremarkable.  Patient endorses multiple previous suicide attempts, states that she cannot remember the last time she attempted, but states that she is on numerous occasions attempted to overdose specifically.  Patient endorses a long history since she was 13 of self injurious behavior, states however the last time that she performed self-injurious behavior was about October November of last year.  Patient endorses no problems largely with sleeping, as well as states that she is eating well a majority of the time.  Discussed with patient that given her recent suicide attempt, struggles with affective instability, suicidal and homicidal ideations that are active, severe paranoia around her parents, and severe psychosocial stress around potentially losing her daughter, recommendation would be for inpatient mental health hospitalization, and due to continue/adjust the patient's current medication regimen when appropriate, to which patient endorsed that she was amenable to this.   Review of Systems  Constitutional:  Positive for malaise/fatigue.  Gastrointestinal:  Positive for nausea. Negative for abdominal pain,  constipation, diarrhea and vomiting.  Musculoskeletal:  Positive for myalgias.  Neurological:  Negative for dizziness, tremors, seizures, loss of consciousness, weakness and headaches.  Psychiatric/Behavioral:  Positive for depression and suicidal ideas (Active thoughts, no plan). Negative for hallucinations and substance abuse. The patient is nervous/anxious. The patient does not have insomnia.   All other systems reviewed and are negative.    Psychiatric and Social History  Psychiatric History:   Information collected from Review of chart/patient   Prev Dx/Sx: see above Current Psych Provider: [facility] Home Meds (current): Per patient metoprolol , metformin , Caplyta, trazodone , Cogentin , Cymbalta , prazosin , gabapentin  Previous Med Trials: Haldol , Vrylar, Aripirazole, Risperidone   Therapy: yes, current, through [facility] ACT team   Prior Psych Hospitalization: Multiple times  Prior Self Harm: yes, states multiple suicide attempts Prior Violence: Denies   Family Psych History: Denies Family Hx suicide: Denies   Social History:  Developmental Hx: Asperger's disease  Educational Hx: Associate two years degree Occupational Hx: none, unemployed, on disability Legal Hx: Multiple involuntary commitments Living Situation: Apartment with daughter, but losing custody Spiritual Hx: Denies  Access to weapons/lethal means: Reports knives at home   Substance History Alcohol : Denies  Tobacco: Denies  Illicit drugs: Denies, UDS negative Prescription drug abuse: Denies Rehab hx: Denies   Exam Findings  Physical Exam: As below Vital Signs:  Temp:  [97.9 F (36.6 C)-98.4 F (36.9 C)] 98.4 F (36.9 C) (06/07 1140) Pulse Rate:  [72-80] 80 (06/07 1318) Resp:  [13-20] 20 (06/07 1318) BP: (96-124)/(37-88) 118/63 (06/07 1318) SpO2:  [96 %-100 %] 100 % (06/07 1318) Weight:  [103.9 kg] 103.9 kg (06/07 0713) Blood pressure 118/63, pulse 80, temperature 98.4 F (36.9  C), temperature source Oral, resp. rate 20,  height 5\' 5"  (1.651 m), weight 103.9 kg, SpO2 100%. Body mass index is 38.12 kg/m.  Physical Exam Vitals and nursing note reviewed.  Constitutional:      General: She is not in acute distress.    Appearance: She is obese. She is ill-appearing and toxic-appearing. She is not diaphoretic.     Comments: Sad interpersonal style   Pulmonary:     Effort: Pulmonary effort is normal.  Skin:    Coloration: Skin is pale.  Neurological:     Mental Status: She is alert and oriented to person, place, and time.     Motor: No weakness, tremor or seizure activity.  Psychiatric:        Attention and Perception: Attention and perception normal. She does not perceive auditory or visual hallucinations.        Mood and Affect: Mood is depressed. Affect is tearful.        Speech: Speech normal.        Behavior: Behavior is cooperative.        Thought Content: Thought content is paranoid (About losing her daughter; believes parents might try to have her killed) and delusional (Believes parents might be attempting to have her killed). Thought content includes homicidal and suicidal ideation. Thought content includes homicidal (Wants to stab her parents) plan. Thought content does not include suicidal plan.        Cognition and Memory: Cognition and memory normal.        Judgment: Judgment is impulsive and inappropriate.    Mental Status Exam: General Appearance: Unkept, obese Caucasian female, who appears ill and toxic appearing, with sad and depressed interpersonal style  Orientation:  Full (Time, Place, and Person)  Memory:  WDL  Concentration:  Concentration: Fair and Attention Span: Fair  Recall:  Fair  Attention  Fair  Eye Contact:  Minimal  Speech:  Clear and Coherent and Normal Rate  Language:  Fair  Volume:  Normal  Mood: Depressed  Affect:  Tearful  Thought Process:  Coherent, Goal Directed, and Linear  Thought Content:  Negative  Suicidal  Thoughts:  Yes.  without intent/plan  Homicidal Thoughts:  Yes.  with intent/plan  Judgement:  Poor  Insight:  Lacking  Psychomotor Activity:  Normal  Akathisia:  No  Fund of Knowledge:  Fair      Assets:  Manufacturing systems engineer Desire for Improvement Financial Resources/Insurance Housing Leisure Time Physical Health Resilience Social Support Talents/Skills Transportation Vocational/Educational  Cognition:  WNL  ADL's:  Intact  AIMS (if indicated):   0     Other History   These have been pulled in through the EMR, reviewed, and updated if appropriate.  Family History:  The patient's family history includes Asthma in her father; Mental illness in her father; PDD in her brother; Seizures in her brother.  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    Chronic constipation    Depression    Drug-seeking behavior    Essential tremor    Headache    Ineffective individual coping 05/16/2022   Insomnia 01/12/2022   Intentional drug overdose (HCC) 06/05/2022   Neuromuscular disorder (  HCC)    Normocytic anemia 06/05/2022   Overdose 07/22/2017   Overdose of acetaminophen  07/2017   and other meds   Overdose, intentional self-harm, initial encounter (HCC) 07/20/2021   Paranoia (HCC) 04/22/2021   Paroxysmal atrial fibrillation (HCC)    Personality disorder (HCC)    Prolonged QT interval    Purposeful non-suicidal drug ingestion (HCC) 06/27/2021   Schizo-affective psychosis (HCC)    Schizoaffective disorder (HCC) 07/29/2022   Schizoaffective disorder, bipolar type (HCC)    Seizures (HCC)    Last seizure December 2017   Skin erythema 04/27/2022    Sleep apnea    Suicidal behavior 07/25/2021   Suicidal ideation    Suicide (HCC) 07/01/2021   Suicide attempt (HCC) 07/04/2021    Surgical History: Past Surgical History:  Procedure Laterality Date   MOUTH SURGERY  2009 or 2010     Medications:   Current Facility-Administered Medications:    ibuprofen  (ADVIL ) tablet 400 mg, 400 mg, Oral, Q6H PRN, Foust, Katy L, NP   magnesium  sulfate IVPB 2 g 50 mL, 2 g, Intravenous, Once, Foust, Katy L, NP, Last Rate: 50 mL/hr at 07/13/23 1319, 2 g at 07/13/23 1319   pantoprazole  (PROTONIX ) EC tablet 40 mg, 40 mg, Oral, Daily, Foust, Katy L, NP, 40 mg at 07/13/23 1234   polyethylene glycol (MIRALAX  / GLYCOLAX ) packet 17 g, 17 g, Oral, Daily PRN, Foust, Katy L, NP  Current Outpatient Medications:    albuterol  (VENTOLIN  HFA) 108 (90 Base) MCG/ACT inhaler, Inhale 2 puffs into the lungs every 6 (six) hours as needed for wheezing or shortness of breath., Disp: , Rfl:    benztropine  (COGENTIN ) 0.5 MG tablet, Take 0.5 mg by mouth 2 (two) times daily., Disp: , Rfl:    benztropine  (COGENTIN ) 1 MG tablet, Take 1 mg by mouth at bedtime., Disp: , Rfl:    CAPLYTA 42 MG capsule, Take 42 mg by mouth daily., Disp: , Rfl:    DULoxetine  (CYMBALTA ) 60 MG capsule, Take 60 mg by mouth daily., Disp: , Rfl:    gabapentin  (NEURONTIN ) 600 MG tablet, Take 600 mg by mouth 3 (three) times daily., Disp: , Rfl:    losartan (COZAAR) 100 MG tablet, Take 100 mg by mouth daily., Disp: , Rfl:    metFORMIN  (GLUCOPHAGE ) 500 MG tablet, Take 1 tablet (500 mg total) by mouth daily with breakfast. (Patient taking differently: Take 500 mg by mouth 2 (two) times daily with a meal.), Disp: , Rfl:    metoprolol  tartrate (LOPRESSOR ) 25 MG tablet, Take 25 mg by mouth 2 (two) times daily., Disp: , Rfl:    OLANZapine  (ZYPREXA ) 10 MG tablet, Take 10 mg by mouth at bedtime., Disp: , Rfl:    pantoprazole  (PROTONIX ) 40 MG tablet, Take 40 mg by mouth daily., Disp: , Rfl:    prazosin  (MINIPRESS ) 2 MG  capsule, Take 2 mg by mouth at bedtime., Disp: , Rfl:    propafenone  (RYTHMOL  SR) 425 MG 12 hr capsule, Take 425 mg by mouth in the morning and at bedtime., Disp: , Rfl:    risperiDONE  (RISPERDAL ) 3 MG tablet, Take 3 mg by mouth in the morning and at bedtime., Disp: , Rfl:    SYMBICORT 160-4.5 MCG/ACT inhaler, Inhale 2 puffs into the lungs in the morning and at bedtime., Disp: , Rfl:    traZODone  (DESYREL ) 100 MG tablet, Take 100 mg by mouth at bedtime., Disp: , Rfl:    UZEDY  200 MG/0.56ML SUSY, Inject 200 mg into the skin., Disp: ,  Rfl:   Allergies: Allergies  Allergen Reactions   Bee Venom Anaphylaxis   Coconut Flavoring Agent (Non-Screening) Anaphylaxis and Rash   Fish Allergy Anaphylaxis   Geodon  [Ziprasidone  Hcl] Other (See Comments)    Pt states that this medication causes paralysis of the mouth.     Haloperidol  And Related Other (See Comments)    Pt states that this medication causes paralysis of the mouth, jaw locks up   Lithobid [Lithium ] Other (See Comments)    Seizure-like activity    Roxicodone  [Oxycodone ] Other (See Comments)    Hallucinations    Seroquel  [Quetiapine ] Other (See Comments)    Severe drowsiness    Shellfish Allergy Anaphylaxis   Phenergan  [Promethazine  Hcl] Other (See Comments)    Chest pain     Prilosec [Omeprazole] Nausea And Vomiting and Other (See Comments)    Pt can take protonix  with no problems    Sulfa Antibiotics Other (See Comments)    Chest pain    Tegretol  [Carbamazepine ] Nausea And Vomiting   Prozac  [Fluoxetine ] Other (See Comments)    Increased Depression and Suicidal thoughts   Tape Other (See Comments)    Skin tears, can only tolerate paper tape.   Tylenol  [Acetaminophen ] Rash and Other (See Comments)    Rash on face     Volanda Gruber, NP

## 2023-07-13 NOTE — ED Provider Notes (Signed)
 [place] EMERGENCY DEPARTMENT AT [facility] Provider Note   CSN: [id number] Arrival date & time: 07/13/23  4098     History  No chief complaint on file.   [name] is a 33 y.o. female.  The history is provided by the patient, the EMS personnel and medical records. No language interpreter was used.     33 year old female history of schizoaffective disorder, PTSD, borderline personality disorder, recurrent intentional drug overdose brought here via EMS with concerns of drug overdose.  Patient states that she took a large amount of pills approximate 1 hour ago and attempt to kill herself.  Pt sts she took 3 hand for off her home medications.  Patient is endorsing both homicidal and suicidal ideation, stating that her parent is trying to take her daughter away from her.  She admits to feeling depressed, felt nauseous but denies any active pain.  Denies any recent alcohol  or drug use.  Denies any hallucination.  History is limited.  Home Medications Prior to Admission medications   Medication Sig Start Date End Date Taking? Authorizing Provider  albuterol  (VENTOLIN  HFA) 108 (90 Base) MCG/ACT inhaler Inhale 2 puffs into the lungs every 6 (six) hours as needed for wheezing or shortness of breath. 04/09/23   Costella Dirks, NP  benztropine  (COGENTIN ) 1 MG tablet Take 1 mg by mouth at bedtime. 03/21/23   [provider]  cephALEXin  (KEFLEX ) 500 MG capsule Take 1 capsule (500 mg total) by mouth 3 (three) times daily. 06/18/23   Steinl, Kevin, MD  DULoxetine  (CYMBALTA ) 60 MG capsule Take 60 mg by mouth daily.    [provider]  metFORMIN  (GLUCOPHAGE ) 500 MG tablet Take 1 tablet (500 mg total) by mouth daily with breakfast. Patient taking differently: Take 500 mg by mouth 2 (two) times daily with a meal. 04/10/23   Costella Dirks, NP  OLANZapine  (ZYPREXA ) 10 MG tablet Take 10 mg by mouth at bedtime. 04/25/23   [provider]  propafenone   (RYTHMOL  SR) 425 MG 12 hr capsule Take 425 mg by mouth in the morning and at bedtime. 03/18/23   [provider]  risperiDONE  (RISPERDAL ) 3 MG tablet Take 3 mg by mouth in the morning and at bedtime.    [provider]  SYMBICORT 160-4.5 MCG/ACT inhaler Inhale 2 puffs into the lungs in the morning and at bedtime. 01/24/23   [provider]  traZODone  (DESYREL ) 100 MG tablet Take 100 mg by mouth at bedtime.    [provider]      Allergies    Bee venom, Coconut flavoring agent (non-screening), Fish allergy, Geodon  [ziprasidone  hcl], Haloperidol  and related, Lithobid [lithium ], Roxicodone  [oxycodone ], Seroquel  [quetiapine ], Shellfish allergy, Phenergan  [promethazine  hcl], Prilosec [omeprazole], Sulfa antibiotics, Tegretol  [carbamazepine ], Prozac  [fluoxetine ], Tape, and Tylenol  [place].Flatten ]    Review of Systems   Review of Systems  All other systems reviewed and are negative.   Physical Exam Updated Vital Signs BP 124/74   Pulse 73   Temp 97.9 F (36.6 C) (Oral)   Resp 19   Ht 5\' 5"  (1.651 m)   Wt 103.9 kg   LMP  (LMP Unknown)   SpO2 100%   BMI 38.12 kg/m  Physical Exam Vitals and nursing note reviewed.  Constitutional:      General: She is not in acute distress.    Appearance: She is well-developed. She is obese.     Comments: Patient sitting in bed holding emesis bag  HENT:  Head: Atraumatic.  Eyes:     Conjunctiva/sclera: Conjunctivae normal.  Cardiovascular:     Rate and Rhythm: Normal rate and regular rhythm.     Pulses: Normal pulses.     Heart sounds: Normal heart sounds.  Pulmonary:     Effort: Pulmonary effort is normal.  Abdominal:     Palpations: Abdomen is soft.  Musculoskeletal:     Cervical back: Neck supple.  Skin:    Findings: No rash.  Neurological:     Mental Status: She is alert.     GCS: GCS eye subscore is 4. GCS verbal subscore is 5. GCS motor subscore is 6.  Psychiatric:        Mood and Affect: Mood  normal.        Speech: Speech normal.        Behavior: Behavior is cooperative.        Thought Content: Thought content includes homicidal and suicidal ideation.     ED Results / Procedures / Treatments   Labs (all labs ordered are listed, but only abnormal results are displayed) Labs Reviewed  COMPREHENSIVE METABOLIC PANEL WITH GFR - Abnormal; Notable for the following components:      Result Value   Glucose, Bld 110 (*)    BUN <5 (*)    All other components within normal limits  SALICYLATE LEVEL - Abnormal; Notable for the following components:   Salicylate Lvl <7.0 (*)    All other components within normal limits  CBC WITH DIFFERENTIAL/PLATELET - Abnormal; Notable for the following components:   Hemoglobin 11.5 (*)    MCH 25.2 (*)    All other components within normal limits  URINALYSIS, ROUTINE W REFLEX MICROSCOPIC - Abnormal; Notable for the following components:   APPearance HAZY (*)    All other components within normal limits  ACETAMINOPHEN  LEVEL - Abnormal; Notable for the following components:   Acetaminophen  (Tylenol ), Serum <10 (*)    All other components within normal limits  ETHANOL  RAPID URINE DRUG SCREEN, HOSP PERFORMED  HCG, SERUM, QUALITATIVE  ACETAMINOPHEN  LEVEL    EKG EKG Interpretation Date/Time:  Saturday July 13 2023 07:19:19 EDT Ventricular Rate:  116 PR Interval:    QRS Duration:  83 QT Interval:  408 QTC Calculation: 453 R Axis:   42  Text Interpretation: Sinus rhythm Ventricular tachycardia, unsustained Nonspecific T abnormalities, lateral leads when compared to prior, more artifact and wandering baseline No STEMI Confirmed by Wynell Heath ([id number]) on 07/13/2023 7:21:19 AM  Radiology No results found.  Procedures .Critical Care  Performed by: Debbra Fairy, PA-C Authorized by: Debbra Fairy, PA-C   Critical care provider statement:    Critical care time (minutes):  30   Critical care was time spent personally by me on the following  activities:  Development of treatment plan with patient or surrogate, discussions with consultants, evaluation of patient's response to treatment, examination of patient, ordering and review of laboratory studies, ordering and review of radiographic studies, ordering and performing treatments and interventions, pulse oximetry, re-evaluation of patient's condition and review of old charts     Medications Ordered in ED Medications  sodium chloride  0.9 % bolus 1,000 mL (1,000 mLs Intravenous New Bag/Given 07/13/23 0738)  charcoal activated (NO SORBITOL) (ACTIDOSE-AQUA) suspension 100 g (100 g Oral Given 07/13/23 8657)    ED Course/ Medical Decision Making/ A&P  Medical Decision Making Amount and/or Complexity of Data Reviewed Labs: ordered. ECG/medicine tests: ordered.  Risk OTC drugs.   BP 124/74   Pulse 73   Temp 97.9 F (36.6 C) (Oral)   Resp 19   Ht 5\' 5"  (1.651 m)   Wt 103.9 kg   LMP  (LMP Unknown)   SpO2 100%   BMI 38.12 kg/m   39:28 AM  33 year old female history of schizoaffective disorder, PTSD, borderline personality disorder, recurrent intentional drug overdose brought here via EMS with concerns of drug overdose.  Patient states that she took a large amount of pills approximate 1 hour ago and attempt to kill herself due to having both homicidal and suicidal ideation after stating that her parent is trying to take her daughter away from her.  She admits to feeling depressed, felt nauseous but denies any active pain.  Denies any recent alcohol  or drug use.  Denies any hallucination.  History is limited.  On exam, this is an obese female sitting in bed actively vomiting into an emesis bag.  I can appreciate residual white digested pills in her emesis.  She does not have any active pain and no signs of trauma.  Vital signs overall reassuring no hypotension no hypoxia.  Patient admits to taking all of her home medications (3 hand full) which  includes metoprolol , metformin , Caplyta, trazodone , Cogentin , Cymbalta , prazosin , and gabapentin .  Last known ingestion approximately an hour ago.  Poison control was promptly consulted and they recommend observing ACS protocol for metoprolol , watch for GI complaint and lactic acidosis with metformin , monitor for CNS depression due to trazodone  and gabapentin .  Risk of seizure due to gabapentin  and to monitor for at least 12 hours.  Cymbalta  has risk factor but lasts for at least 12 hours.  They recommend a repeat Tylenol  level at 4 hours postingestion.  A repeat EKG in 6 hours.  Activated charcoal without sorbitol can be given at 100 g  -Labs ordered, independently viewed and interpreted by me.  Labs remarkable for normal initial Tylenol  level, labs overall reassuring, negative UDS. -The patient was maintained on a cardiac monitor.  I personally viewed and interpreted the cardiac monitored which showed an underlying rhythm of: Normal sinus rhythm no prolonged QT -Imaging not considered -This patient presents to the ED for concern of intentional drug overdose, this involves an extensive number of treatment options, and is a complaint that carries with it a high risk of complications and morbidity.  The differential diagnosis includes Anxiety, depression,  metabolic derangement, cardiac arrhythmia,  lactic acidosis, CNS depression, seizures -Co morbidities that complicate the patient evaluation includes  psychiatric illness, history of intentional drug overdose in the past -Treatment includes  activated charcoal, IV fluid, close monitoring -Reevaluation of the patient after these medicines showed that the patient stayed the same -PCP office notes or outside notes reviewed -Discussion with specialist  Poison control will provide appropriate rec patient for monitoring which will likely require extensive training to patient returned back to baseline.  Appreciate consultation from [facility]  as well as Dr. Drexel Gentles  who agrees to see and will   Admit patient.  I have filed IVC paper.  Care discussed with Dr.Tegeler -Escalation to admission/observation considered: patient is amenable with admission.  She is currently not psych clear.         Final Clinical Impression(s) / ED Diagnoses Final diagnoses:  Intentional drug overdose, initial encounter [facility])  Suicide attempt [facility])    Rx / DC Orders ED Discharge  Orders     None         Debbra Fairy, PA-C 07/13/23 1031    Tegeler, Marine Sia, MD 07/13/23 [phone]

## 2023-07-13 NOTE — ED Notes (Signed)
 IVC paperwork complete and in blue zone, expires 07/20/23, case # [id number]

## 2023-07-13 NOTE — ED Notes (Signed)
 Called staffing. No sitters available at this time. Pt in room with door open. Report given to nurse

## 2023-07-13 NOTE — ED Notes (Signed)
 Lactic 2.1 provider made aware

## 2023-07-13 NOTE — ED Notes (Signed)
 IVC paperwork complete and in purple zone, expires 07/20/23, case # [id number]

## 2023-07-13 NOTE — H&P (Signed)
 History and Physical    [name] WUJ:[id number] DOB: 02-Jan-1991 DOA: 07/13/2023  DOS: the patient was seen and examined on 07/13/2023  [facility]: [facility]   Patient coming from: Home  I have personally briefly reviewed patient's old medical records in [facility]  HPI:   [name] is a 33 y.o. year old female with past medical history of multiple prior suicide attempts via intentional overdoses, moderate persistent asthma, schizoaffective disorder-bipolar type, borderline personality disorder, prediabetes, paroxysmal atrial fibrillation, anemia of chronic disease with baseline hemoglobin 9-11.  She presents to [facility] ED after intentional overdose around 6 AM this morning where she took a handful of her home medications after her parents attempted to take her child. She took 3 handfuls of her home medications which include metoprolol , metformin , Caplyta, trazodone , Cogentin , Cymbalta , prazosin , and gabapentin .  On interview patient endorses experiencing SI this morning and intentionally taking more pills than prescribed, she does endorse taking 3 handfuls of medication but she declines to tell me which medications.  She endorses auditory hallucinations, denies visual hallucinations and declines to share what she is hearing. Patient also endorses HI stating she was upset with her aunt this morning and had thoughts of harming her Aunt, she does admit to being upset regarding her child but then answers "I do not know" to any further questions.  For me patient was minimally cooperative and frustrated by questions asked.   ED Course: On arrival to [facility] ED patient was noted to be afebrile temp 36.6 C, BP 124/74, HR 73, RR 19, SpO2 100% on room air.  Labs notable for hemoglobin 11.5, Mg 1.5, K 3.6. She was given activated charcoal and a 1 L NS bolus.  Patient placed under IVC by EDP.  Poison control was contacted and per EDP documentation recommended  observing ACS protocol for metoprolol , monitor for GI upset and lactic acidosis with metformin , monitoring for CNS depression due to trazodone  and gabapentin , and monitoring for seizures as patient has a seizure risk due to gabapentin .  [facility] contacted for admission.  Review of Systems: unable to review all systems due to the inability of the patient to answer questions.  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 fragile X syndrome    Chronic constipation    Depression    Drug-seeking behavior    Essential tremor    Headache    Ineffective individual coping 05/16/2022   Insomnia 01/12/2022   Intentional drug overdose (HCC) 06/05/2022   Neuromuscular disorder (HCC)    Normocytic anemia 06/05/2022   Overdose 07/22/2017   Overdose of acetaminophen  07/2017   and other meds   Overdose, intentional self-harm, initial encounter (HCC) 07/20/2021   Paranoia (HCC) 04/22/2021   Paroxysmal atrial fibrillation (HCC)    Personality disorder (HCC)    Prolonged QT interval    Purposeful non-suicidal drug ingestion (HCC) 06/27/2021   Schizo-affective psychosis (HCC)    Schizoaffective disorder (HCC) 07/29/2022   Schizoaffective disorder, bipolar type (HCC)    Seizures (HCC)    Last seizure December 2017   Skin erythema 04/27/2022   Sleep apnea    Suicidal behavior  07/25/2021   Suicidal ideation    Suicide (HCC) 07/01/2021   Suicide attempt (HCC) 07/04/2021    Past Surgical History:  Procedure Laterality Date   MOUTH SURGERY  2009 or 2010     reports that she has quit smoking. Her smoking use included cigarettes. She has never used smokeless tobacco. She reports that she does not drink alcohol   and does not use drugs.  Allergies  Allergen Reactions   Bee Venom Anaphylaxis   Coconut Flavoring Agent (Non-Screening) Anaphylaxis and Rash   Fish Allergy Anaphylaxis   Geodon  [Ziprasidone  Hcl] Other (See Comments)    Pt states that this medication causes paralysis of the mouth.     Haloperidol  And Related Other (See Comments)    Pt states that this medication causes paralysis of the mouth, jaw locks up   Lithobid [Lithium ] Other (See Comments)    Seizure-like activity    Roxicodone  [Oxycodone ] Other (See Comments)    Hallucinations    Seroquel  [Quetiapine ] Other (See Comments)    Severe drowsiness    Shellfish Allergy Anaphylaxis   Phenergan  [Promethazine  Hcl] Other (See Comments)    Chest pain     Prilosec [Omeprazole] Nausea And Vomiting and Other (See Comments)    Pt can take protonix  with no problems    Sulfa Antibiotics Other (See Comments)    Chest pain    Tegretol  [Carbamazepine ] Nausea And Vomiting   Prozac  [Fluoxetine ] Other (See Comments)    Increased Depression and Suicidal thoughts   Tape Other (See Comments)    Skin tears, can only tolerate paper tape.   Tylenol  [Acetaminophen ] Rash and Other (See Comments)    Rash on face     Family History  Problem Relation Age of Onset   Mental illness Father    Asthma Father    PDD Brother    Seizures Brother     Prior to Admission medications   Medication Sig Start Date End Date Taking? Authorizing Provider  albuterol  (VENTOLIN  HFA) 108 (90 Base) MCG/ACT inhaler Inhale 2 puffs into the lungs every 6 (six) hours as needed for wheezing or shortness of breath. 04/09/23   Costella Dirks, NP  benztropine  (COGENTIN ) 0.5 MG tablet Take 0.5 mg by mouth 2 (two) times daily. 07/04/23   [provider]  benztropine  (COGENTIN ) 1 MG tablet Take 1 mg by mouth at bedtime. 03/21/23   [provider]  CAPLYTA 42 MG capsule Take 42 mg by mouth daily. 07/04/23   [provider]  DULoxetine  (CYMBALTA ) 60 MG  capsule Take 60 mg by mouth daily.    [provider]  gabapentin  (NEURONTIN ) 600 MG tablet Take 600 mg by mouth 3 (three) times daily. 07/04/23   [provider]  losartan (COZAAR) 100 MG tablet Take 100 mg by mouth daily. 07/04/23   [provider]  metFORMIN  (GLUCOPHAGE ) 500 MG tablet Take 1 tablet (500 mg total) by mouth daily with breakfast. Patient taking differently: Take 500 mg by mouth 2 (two) times daily with a meal. 04/10/23   Costella Dirks, NP  metoprolol  tartrate (LOPRESSOR ) 25 MG tablet Take 25 mg by mouth 2 (two) times daily. 07/04/23   [provider]  OLANZapine  (ZYPREXA ) 10 MG tablet Take 10 mg by mouth at bedtime. 04/25/23   [provider]  pantoprazole  (PROTONIX ) 40 MG tablet Take 40 mg by mouth daily.    [provider]  prazosin  (MINIPRESS ) 2 MG capsule Take 2 mg by mouth at bedtime. 07/04/23  [provider]  propafenone  (RYTHMOL  SR) 425 MG 12 hr capsule Take 425 mg by mouth in the morning and at bedtime. 03/18/23   [provider]  risperiDONE  (RISPERDAL ) 3 MG tablet Take 3 mg by mouth in the morning and at bedtime.    [provider]  SYMBICORT 160-4.5 MCG/ACT inhaler Inhale 2 puffs into the lungs in the morning and at bedtime. 01/24/23   [provider]  traZODone  (DESYREL ) 100 MG tablet Take 100 mg by mouth at bedtime.    [provider]  UZEDY  200 MG/0.56ML SUSY Inject 200 mg into the skin. 06/28/23   [provider]    Physical Exam: Vitals:   07/13/23 0713 07/13/23 0717 07/13/23 0859  BP:  124/74 121/88  Pulse:  73 80  Resp:  19 13  Temp:  97.9 F (36.6 C)   TempSrc:  Oral   SpO2:  100% 100%  Weight: 103.9 kg    Height: 5\' 5"  (1.651 m)      Physical Exam   Constitutional: Obese, Caucasian female  Respiratory: clear to auscultation bilaterally, no wheezing, no crackles. Normal respiratory effort. No accessory muscle use.  Cardiovascular: Regular rate  and rhythm, no murmurs / rubs / gallops. 2+ radial and pedal pulses.  Abdomen: no tenderness to palpation elicited (patient is sleeping soundly on exam) Bowel sounds positive x4 quadrants.  Skin: Warm, dry.  No rashes, lesions, ulcers. Neurologic: Drowsy, follows commands   Labs on Admission: I have personally reviewed following labs and imaging studies  CBC: Recent Labs  Lab 07/13/23 0715  WBC 7.6  NEUTROABS 4.5  HGB 11.5*  HCT 37.2  MCV 81.4  PLT 263   Basic Metabolic Panel: Recent Labs  Lab 07/13/23 0715  NA 141  K 3.6  CL 109  CO2 22  GLUCOSE 110*  BUN <5*  CREATININE 0.68  CALCIUM  9.1   GFR: Estimated Creatinine Clearance: 118.6 mL/min (by C-G formula based on SCr of 0.68 mg/dL). Liver Function Tests: Recent Labs  Lab 07/13/23 0715  AST 20  ALT 18  ALKPHOS 65  BILITOT 0.3  PROT 6.9  ALBUMIN 3.7   No results for input(s): "LIPASE", "AMYLASE" in the last 168 hours. No results for input(s): "AMMONIA" in the last 168 hours. Coagulation Profile: No results for input(s): "INR", "PROTIME" in the last 168 hours. Cardiac Enzymes: No results for input(s): "CKTOTAL", "CKMB", "CKMBINDEX", "TROPONINI", "TROPONINIHS" in the last 168 hours. BNP (last 3 results) No results for input(s): "BNP" in the last 8760 hours. HbA1C: No results for input(s): "HGBA1C" in the last 72 hours. CBG: No results for input(s): "GLUCAP" in the last 168 hours. Lipid Profile: No results for input(s): "CHOL", "HDL", "LDLCALC", "TRIG", "CHOLHDL", "LDLDIRECT" in the last 72 hours. Thyroid  Function Tests: No results for input(s): "TSH", "T4TOTAL", "FREET4", "T3FREE", "THYROIDAB" in the last 72 hours. Anemia Panel: No results for input(s): "VITAMINB12", "FOLATE", "FERRITIN", "TIBC", "IRON", "RETICCTPCT" in the last 72 hours. Urine analysis:    Component Value Date/Time   COLORURINE YELLOW 07/13/2023 0715   APPEARANCEUR HAZY (A) 07/13/2023 0715   LABSPEC 1.016 07/13/2023 0715   PHURINE  5.0 07/13/2023 0715   GLUCOSEU NEGATIVE 07/13/2023 0715   HGBUR NEGATIVE 07/13/2023 0715   BILIRUBINUR NEGATIVE 07/13/2023 0715   BILIRUBINUR negative 06/14/2015 0930   KETONESUR NEGATIVE 07/13/2023 0715   PROTEINUR NEGATIVE 07/13/2023 0715   UROBILINOGEN 0.2 10/24/2015 1428   NITRITE NEGATIVE 07/13/2023 0715   LEUKOCYTESUR NEGATIVE 07/13/2023 0715    Radiological Exams  on Admission: I have personally reviewed images No results found.  EKG: My personal interpretation of EKG shows: Normal sinus rhythm, HR 71   Assessment/Plan ##Intentional drug overdose ##Suicide attempt Patient reports taking 3 handfuls of metoprolol , metformin , caplyta, trazodone , Cogentin , Cymbalta , prazosin , and gabapentin  in effort to end her life after her parent attempted to take her child. - 1:1 suicide monitoring - IVC'd by ED provider - q6H EKGs to monitor for QTc prolongation - Check lactic acid - Supplement magnesium  and potassium for goal K> 4, Mg > 2  #Schizoaffective disorder, bipolar type #Borderline personality disorder #PTSD #Generalized anxiety disorder - Hold home meds in setting of overdose  # Hypokalemia # Hypomagnesemia - 40 mEq PO K - 2 g IV Mg - Replete as needed for goal K >4,Mg> 2 - Repeat BMP with a.m. labs  #Atrial fibrillation - Hold home metoprolol  in setting of intentional overdose - Patient not on outpatient anticoagulation and with CHA2DS2-VASc score of  1 no indication to start  #Prediabetes - Hold home metformin  for now  #Morbid Obesity Body mass index is 38.12 kg/m. - Recommend outpatient bariatric medicine follow-up, information for [facility] added to AVS   VTE prophylaxis: SCDS  GI prophylaxis: Protonix   Diet: Regular Access: PIV Lines: NONE Code Status: Full Telemetry: Yes Disposition: Admit to Med-Surg with Telemetry Patient is from: Home Anticipated d/c is to: Inpatient psychiatric facility Anticipated d/c date is: 1 to 2  days Patient currently: In medical observation period, following overdose   Family Communication: No family at bedside, patient did not consent to family update    Consults called: Psychiatry   To reach the provider On-Call:   7AM- 7PM see care teams to locate the attending and reach out to them via [email or web address] Password: [email or web address] 7PM-7AM contact night-coverage If you still have difficulty reaching the appropriate provider, please page the [facility] (Director on Call) for [facility] on amion for assistance  This document was prepared using Sales executive software and may include unintentional dictation errors.  Naida Austria FNP-BC, PMHNP-BC Nurse Practitioner [facility]

## 2023-07-13 NOTE — ED Notes (Signed)
 Poison control [phone] Concha Deed)

## 2023-07-13 NOTE — Discharge Instructions (Addendum)
-   Follow up with [facility] for guidance on weight reduction, healthier food options, and incorporating physical activity into your day. Call [phone] to schedule a visit. You do not need a referral.

## 2023-07-14 DIAGNOSIS — F603 Borderline personality disorder: Secondary | ICD-10-CM | POA: Diagnosis not present

## 2023-07-14 DIAGNOSIS — T50902A Poisoning by unspecified drugs, medicaments and biological substances, intentional self-harm, initial encounter: Secondary | ICD-10-CM | POA: Diagnosis not present

## 2023-07-14 DIAGNOSIS — F250 Schizoaffective disorder, bipolar type: Secondary | ICD-10-CM | POA: Diagnosis not present

## 2023-07-14 DIAGNOSIS — T50901A Poisoning by unspecified drugs, medicaments and biological substances, accidental (unintentional), initial encounter: Secondary | ICD-10-CM | POA: Diagnosis present

## 2023-07-14 LAB — CBC
HCT: 33.6 % — ABNORMAL LOW (ref 36.0–46.0)
Hemoglobin: 10.7 g/dL — ABNORMAL LOW (ref 12.0–15.0)
MCH: 25.8 pg — ABNORMAL LOW (ref 26.0–34.0)
MCHC: 31.8 g/dL (ref 30.0–36.0)
MCV: 81.2 fL (ref 80.0–100.0)
Platelets: 253 10*3/uL (ref 150–400)
RBC: 4.14 MIL/uL (ref 3.87–5.11)
RDW: 14.6 % (ref 11.5–15.5)
WBC: 6.9 10*3/uL (ref 4.0–10.5)
nRBC: 0 % (ref 0.0–0.2)

## 2023-07-14 LAB — COMPREHENSIVE METABOLIC PANEL WITH GFR
ALT: 18 U/L (ref 0–44)
AST: 19 U/L (ref 15–41)
Albumin: 3.1 g/dL — ABNORMAL LOW (ref 3.5–5.0)
Alkaline Phosphatase: 55 U/L (ref 38–126)
Anion gap: 6 (ref 5–15)
BUN: 5 mg/dL — ABNORMAL LOW (ref 6–20)
CO2: 23 mmol/L (ref 22–32)
Calcium: 8.2 mg/dL — ABNORMAL LOW (ref 8.9–10.3)
Chloride: 108 mmol/L (ref 98–111)
Creatinine, Ser: 0.7 mg/dL (ref 0.44–1.00)
GFR, Estimated: >60 mL/min (ref >60)
Glucose, Bld: 92 mg/dL (ref 70–99)
Potassium: 3.5 mmol/L (ref 3.5–5.1)
Sodium: 137 mmol/L (ref 135–145)
Total Bilirubin: 0.4 mg/dL (ref 0.0–1.2)
Total Protein: 5.9 g/dL — ABNORMAL LOW (ref 6.5–8.1)

## 2023-07-14 LAB — MAGNESIUM: Magnesium: 1.9 mg/dL (ref 1.7–2.4)

## 2023-07-14 NOTE — Progress Notes (Signed)
 PROGRESS NOTE    [name]  ZYS:[id number] DOB: Oct 20, 1990 DOA: 07/13/2023 PCP: [facility]  32/F with history of depression, schizoaffective disorder, borderline personality, multiple prior suicide attempts presented to the ED after intentional overdose 6/7 AM taking 3 handfuls of metoprolol , metformin , trazodone , Cogentin , Cymbalta , prazosin  and gabapentin  -She was drowsy and admitted for further observation, given activated charcoal   Subjective: -Feels okay, no complaints, still in the emergency room, sitter at bedside  Assessment and Plan:  Intentional drug overdose Suicide attempt Patient reports taking 3 handfuls of metoprolol , metformin , caplyta, trazodone , Cogentin , Cymbalta , prazosin , and gabapentin   - 1:1 suicide monitoring - Stable and improved now, she is medically stable for discharge to behavioral health when bed available   #Schizoaffective disorder, bipolar type #Borderline personality disorder #PTSD #Generalized anxiety disorder - Hold home meds in setting of overdose   # Hypokalemia # Hypomagnesemia - Repleted yesterday   P.Atrial fibrillation - Metoprolol  on hold, she is in sinus rhythm at this time - Patient not on outpatient anticoagulation and with CHA2DS2-VASc score of  1 no indication to start   #Prediabetes - Hold home metformin  for now   #Morbid Obesity Body mass index is 38.12 kg/m. - Needs diet and lifestyle modification    DVT prophylaxis: Code Status:  Family Communication: Disposition Plan:   Consultants:    Procedures:   Antimicrobials:    Objective: Vitals:   07/13/23 1630 07/13/23 1633 07/14/23 0553 07/14/23 0913  BP: 110/75  107/73 115/63  Pulse: 81  68 73  Resp: 16  16 15   Temp:  98.1 F (36.7 C) 97.6 F (36.4 C) 97.8 F (36.6 C)  TempSrc:   Oral Oral  SpO2: 98%  97% 100%  Weight:      Height:        Intake/Output Summary (Last 24 hours) at 07/14/2023 1039 Last data filed at 07/14/2023  0745 Gross per 24 hour  Intake 1120 ml  Output --  Net 1120 ml   Filed Weights   07/13/23 0713  Weight: 103.9 kg    Examination:  General exam: Appears calm and comfortable, AO x 3, no distress Respiratory system: Clear to auscultation Cardiovascular system: S1 & S2 heard, RRR.  Abd: nondistended, soft and nontender.Normal bowel sounds heard. Central nervous system: Alert and oriented. No focal neurological deficits. Extremities: no edema Skin: No rashes Psychiatry: Flat affect    Data Reviewed:   CBC: Recent Labs  Lab 07/13/23 0715 07/14/23 0542  WBC 7.6 6.9  NEUTROABS 4.5  --   HGB 11.5* 10.7*  HCT 37.2 33.6*  MCV 81.4 81.2  PLT 263 253   Basic Metabolic Panel: Recent Labs  Lab 07/13/23 0715 07/14/23 0542  NA 141 137  K 3.6 3.5  CL 109 108  CO2 22 23  GLUCOSE 110* 92  BUN <5* 5*  CREATININE 0.68 0.70  CALCIUM  9.1 8.2*  MG 1.5* 1.9   GFR: Estimated Creatinine Clearance: 118.6 mL/min (by C-G formula based on SCr of 0.7 mg/dL). Liver Function Tests: Recent Labs  Lab 07/13/23 0715 07/14/23 0542  AST 20 19  ALT 18 18  ALKPHOS 65 55  BILITOT 0.3 0.4  PROT 6.9 5.9*  ALBUMIN 3.7 3.1*   No results for input(s): "LIPASE", "AMYLASE" in the last 168 hours. No results for input(s): "AMMONIA" in the last 168 hours. Coagulation Profile: No results for input(s): "INR", "PROTIME" in the last 168 hours. Cardiac Enzymes: No results for input(s): "CKTOTAL", "CKMB", "CKMBINDEX", "TROPONINI" in the  last 168 hours. BNP (last 3 results) No results for input(s): "PROBNP" in the last 8760 hours. HbA1C: No results for input(s): "HGBA1C" in the last 72 hours. CBG: No results for input(s): "GLUCAP" in the last 168 hours. Lipid Profile: No results for input(s): "CHOL", "HDL", "LDLCALC", "TRIG", "CHOLHDL", "LDLDIRECT" in the last 72 hours. Thyroid  Function Tests: No results for input(s): "TSH", "T4TOTAL", "FREET4", "T3FREE", "THYROIDAB" in the last 72  hours. Anemia Panel: No results for input(s): "VITAMINB12", "FOLATE", "FERRITIN", "TIBC", "IRON", "RETICCTPCT" in the last 72 hours. Urine analysis:    Component Value Date/Time   COLORURINE YELLOW 07/13/2023 0715   APPEARANCEUR HAZY (A) 07/13/2023 0715   LABSPEC 1.016 07/13/2023 0715   PHURINE 5.0 07/13/2023 0715   GLUCOSEU NEGATIVE 07/13/2023 0715   HGBUR NEGATIVE 07/13/2023 0715   BILIRUBINUR NEGATIVE 07/13/2023 0715   BILIRUBINUR negative 06/14/2015 0930   KETONESUR NEGATIVE 07/13/2023 0715   PROTEINUR NEGATIVE 07/13/2023 0715   UROBILINOGEN 0.2 10/24/2015 1428   NITRITE NEGATIVE 07/13/2023 0715   LEUKOCYTESUR NEGATIVE 07/13/2023 0715   Sepsis Labs: @LABRCNTIP (procalcitonin:4,lacticidven:4)  )No results found for this or any previous visit (from the past 240 hours).   Radiology Studies: No results found.   Scheduled Meds:  pantoprazole   40 mg Oral Daily   Continuous Infusions:  sodium chloride  1,000 mL (07/14/23 0427)     LOS: 1 day    Time spent:    Deforest Fast, MD [facility]   07/14/2023, 10:39 AM

## 2023-07-14 NOTE — Progress Notes (Signed)
 Patient has been denied by [facility] due to no appropriate beds available. Patient meets [facility] inpatient criteria per Heyward Loud, NP. Patient has been faxed out to the following facilities:   [facility] [address] [place]., [place] [address] [phone]  [facility] [address] [place] [address] [phone]  [facility] [address] [place], [place] [address] [phone]  [facility] Patient Placement [facility], [place] [phone]  [facility] [address], [place] [address] [phone]  [facility] [address] [place] [address] [phone]  [facility] [address] [place] [address] [phone]  [facility] EFAX [address], [place] [phone]  [facility] [address], [place] [address] [phone]  [name] [address] [place] [address] [phone]  [facility] [address] [place], [place] [address] [phone]  [facility] [address] [place] [address] [phone]  [facility] [address] [place] [address] [phone]  [facility] [address] [place] [address] [phone]  [facility] [address] [place] [address] [phone]  [facility] [address] [place] [address] [phone]   Phares Brasher, MSW, LCSW-A  11:28 AM 07/14/2023

## 2023-07-14 NOTE — ED Notes (Signed)
 EKG completed and given to provider.

## 2023-07-14 NOTE — Consult Note (Cosign Needed Addendum)
 [facility] Psychiatric Consult follow-up  Patient Name: .[name]  MRN: [id number]  DOB: 10-Nov-1990  Consult Order details:  Orders (From admission, onward)     Start     Ordered   07/13/23 1139  IP CONSULT TO PSYCHIATRY       Ordering Provider: Heddy Liverpool, NP  Provider:  (Not yet assigned)  Question Answer Comment  Location [facility]   Reason for Consult? Suicidal Attempt via Intentional Overdose      07/13/23 1138             Mode of Visit: In person    Psychiatry Consult Evaluation  Service Date: July 14, 2023 LOS:  LOS: 1 day  Chief Complaint: "I just tried kill myself"  Primary Psychiatric Diagnoses  Borderline personality disorder (HCC) 2. Schizoaffective disorder, bipolar type [facility])  Assessment   [name] is a 33 y.o. Caucasian female with a past psychiatric history of PTSD, schizoaffective disorder, borderline personality disorder, and GAD, with additional past and pertinent psychiatric history of multiple encounters for delusions and attempted overdose, as well as historical and current use of ACT team services through [facility], with additional past and pertinent medical history/comorbidities of obesity, hyperprolactinemia, paroxysmal A-fib, and seizure disorder, who presented this encounter by way of EMS, after attempting to overdose on the patient's outpatient prescribed indications, in the context of psychosocial stress of potentially losing custody of her daughter.  Patient currently remains not medically clear, still following poison control protocols, with plans to be admitted to the medical floor, and is under involuntary commitment.  Upon evaluation conducted, patient presents with symptomology that is most consistent with the patient's chronic illness courses of borderline personality disorder and schizoaffective disorder bipolar type.  Evidence of this is appreciable from evaluation conducted, where  the patient endorses that upon hearing from her parents that they are close in removing custody of her daughter from her, abruptly and impulsively took several handfuls of her home medications, in an attempt to commit suicide, as well as the patient's endorsements of severe paranoia around believing that her parents are considering having her killed.  Given the patient's recent suicide attempt which led to this encounter, the patient's struggles with severe affective instability, continued active thoughts of wanting to commit suicide, homicidal ideations with plan and intent to kill her parents, and paranoid ideations of believing that her parents want to have her killed, recommendation is for inpatient mental health hospitalization, for safety and stabilization of the patient.  Will refrain from restarting the patient's current outpatient regimen at this time, given the severity of the overdose attempt which led to this encounter not too long ago, though do recommend continuing these medications when appropriate.  07/14/2023  Upon reevaluation, patient continues to present with suicidal and homicidal ideations, depressed mood with congruent and tearful affect, and interpersonal style that is sad, with continued thoughts of paranoid ideations around her parents.  Patient however has been medically cleared, and given medical clearance, patient will remain in the emergency department until disposition for inpatient mental health hospitalization under involuntary commitment is obtained.  Patient has been declined from [facility] hospitalization, but CSW team remains vigilant and faxing patient out to obtained his disposition.  Patient today during reevaluation endorsed that she has no desire to start or restart any medications.  Diagnoses:  Active Hospital problems: Principal Problem:   Suicide attempt by drug ingestion [facility]) Active Problems:   Borderline personality disorder (HCC)  Schizoaffective disorder,  bipolar type (HCC)   Overdose    Plan   #Borderline personality disorder (HCC) #Schizoaffective disorder, bipolar type (HCC)  ## Psychiatric Medication Recommendations:   -Recommend continued holding of the patient's current outpatient psychiatric medications at this time (patient refuses)  ## Medical Decision Making Capacity: Not specifically addressed in this encounter  ## Further Work-up: None at this time  ## Disposition:--Recommend inpatient mental health hospitalization upon medical clearance, patient currently is involuntarily committed  ## Behavioral / Environmental: -Strict agitation/safety precautions    ## Safety and Observation Level:  - Based on my clinical evaluation, I estimate the patient to be at moderate risk of self harm in the current setting. - At this time, we recommend  1:1 Observation. This decision is based on my review of the chart including patient's history and current presentation, interview of the patient, mental status examination, and consideration of suicide risk including evaluating suicidal ideation, plan, intent, suicidal or self-harm behaviors, risk factors, and protective factors. This judgment is based on our ability to directly address suicide risk, implement suicide prevention strategies, and develop a safety plan while the patient is in the clinical setting. Please contact our team if there is a concern that risk level has changed.  CSSR Risk Category:C-SSRS RISK CATEGORY: High Risk  Suicide Risk Assessment: Patient has following modifiable risk factors for suicide: active suicidal ideation, under treated depression , and recklessness, which we are addressing by treatment recommendations. Patient has following non-modifiable or demographic risk factors for suicide: separation or divorce, history of suicide attempt, history of self harm behavior, and psychiatric hospitalization Patient has the following protective factors against suicide: Access  to outpatient mental health care and Minor children in the home  Thank you for this consult request. Recommendations have been communicated to the primary team.  We will continue to follow at this time.   Volanda Gruber, NP     History of Present Illness   Chelle Elizabeth Wichert is a 33 y.o. Caucasian female with a past psychiatric history of PTSD, schizoaffective disorder, borderline personality disorder, and GAD, with additional past and pertinent psychiatric history of multiple encounters for delusions and attempted overdose, as well as historical and current use of ACT team services through [facility], with additional past and pertinent medical history/comorbidities of obesity, hyperprolactinemia, paroxysmal A-fib, and seizure disorder, who presented this encounter by way of EMS, after attempting to overdose on the patient's outpatient prescribed indications, in the context of psychosocial stress of potentially losing custody of her daughter.  Patient currently remains not medically clear, still following poison control protocols, with plans to be admitted to the medical floor, and is under involuntary commitment.  Patient seen today at the [facility] emergency department for face-to-face psychiatric evaluation.  Upon evaluation, patient endorses that earlier today she attempted to end her life by way of drug overdose on her prescribed outpatient medications, in the context of her parents telling her over the phone that they were almost finished with the process of taking custody away from her over her daughter.  Patient endorses that the suicide attempt took place approximately around 8 or 9 AM, and affirms EDP report of taking about 3 handfuls of her prescribed medications.  Patient endorses current physical symptoms from attempted overdose of, mild myalgias, nausea, and some fatigue, but otherwise no appreciable more severe symptomology.  Expanding on recent conversation over the phone  with her parents about potentially having custody taken away from her as  a relates to her daughter, patient states that this has been going on for many months now, but states that after today's phone call, appears that, "I might just finally lose my baby girl".  Patient denies any other psychosocial stressors, states additionally that she is compliant with her outpatient prescribed medications through her [facility] ACT team, and states that they normally work well for her, as well as states that she is also compliant and consistent with participating in therapy through her ACT team, and that it is helpful.  Patient endorses that her mood is formally depressed, and presents with a sad and tearful interpersonal style and affect that is congruent, and with minimal eye contact.  Patient endorses that she remains actively suicidal, but notably has no plan, and endorses that she is actively homicidal and wanting to stab her parents.  Patient endorses no auditory and or visual hallucinations, and objectively, initially does not appear to be presenting with psychotic features, though when asked about paranoid ideations, patient endorses in a serious manner, that she is very worried that her parents are actively working to have her killed.  Patient orientation is intact, no concerns for fluctuations in consciousness.  Patient endorses no EtOH use, drug use, and or tobacco use, shortly but appropriately denies any history of these, though chart reveals small history historically of tobacco use.  UDS appreciably negative, BAL unremarkable.  Patient endorses multiple previous suicide attempts, states that she cannot remember the last time she attempted, but states that she is on numerous occasions attempted to overdose specifically.  Patient endorses a long history since she was 13 of self injurious behavior, states however the last time that she performed self-injurious behavior was about October November of  last year.  Patient endorses no problems largely with sleeping, as well as states that she is eating well a majority of the time.  Discussed with patient that given her recent suicide attempt, struggles with affective instability, suicidal and homicidal ideations that are active, severe paranoia around her parents, and severe psychosocial stress around potentially losing her daughter, recommendation would be for inpatient mental health hospitalization, and due to continue/adjust the patient's current medication regimen when appropriate, to which patient endorsed that she was amenable to this.  07/14/2023  Patient seen today at the [facility] Emergency Department for face-to-face psychiatric reevaluation.  Upon reevaluation, patient endorses, like previous initial engagement together, that her mood remains severely depressed, and presents with a congruent affect that is tearful, and interpersonal style that is sad.  Patient endorses she continues to have thoughts of suicide and homicidal ideations towards her parents with a plan to stab them.  Patient endorses continued paranoid ideations around her parents.  Patient orientation is intact, no concerns or fluctuations in consciousness.  Patient endorses ability to sleep over the night and has been eating regularly.  Patient endorses that she is not amenable to restarting her medications now that she is medically clear, gives no reason as to why, refuses to answer.  Patient endorses no auditory and or visual hallucinations, and objectively, does not appear to be responding to internal and/or external stimuli inappropriately.  Discussed with patient that behavioral health team remains looking for disposition and that she will be updated when disposition is found.  Chart review/nursing:   Patient has not presented with any safety concerns while being monitored and observed.  Patient has been compliant with care measures and medications (up until  endorsing with this provider she is no  longer amenable to take medications).   Review of Systems  Constitutional:  Positive for malaise/fatigue.  Gastrointestinal:  Positive for nausea. Negative for abdominal pain, constipation, diarrhea and vomiting.  Musculoskeletal:  Positive for myalgias.  Neurological:  Negative for dizziness, tremors, seizures, loss of consciousness, weakness and headaches.  Psychiatric/Behavioral:  Positive for depression and suicidal ideas (Active thoughts, no plan). Negative for hallucinations and substance abuse. The patient is nervous/anxious. The patient does not have insomnia.   All other systems reviewed and are negative.    Psychiatric and Social History  Psychiatric History:   Information collected from Review of chart/patient   Prev Dx/Sx: see above Current Psych Provider: [facility] Home Meds (current): Per patient metoprolol , metformin , Caplyta, trazodone , Cogentin , Cymbalta , prazosin , gabapentin  Previous Med Trials: Haldol , Vrylar, Aripirazole, Risperidone   Therapy: yes, current, through [facility] ACT team   Prior Psych Hospitalization: Multiple times  Prior Self Harm: yes, states multiple suicide attempts Prior Violence: Denies   Family Psych History: Denies Family Hx suicide: Denies   Social History:  Developmental Hx: Asperger's disease  Educational Hx: Associate two years degree Occupational Hx: none, unemployed, on disability Legal Hx: Multiple involuntary commitments Living Situation: Apartment with daughter, but losing custody Spiritual Hx: Denies  Access to weapons/lethal means: Reports knives at home   Substance History Alcohol : Denies  Tobacco: Denies  Illicit drugs: Denies, UDS negative Prescription drug abuse: Denies Rehab hx: Denies   Exam Findings  Physical Exam: As below Vital Signs:  Temp:  [97 F (36.1 C)-98.1 F (36.7 C)] 97 F (36.1 C) (06/08 1320) Pulse Rate:  [68-81] 76 (06/08  1457) Resp:  [13-16] 13 (06/08 1457) BP: (107-128)/(63-75) 128/69 (06/08 1457) SpO2:  [97 %-100 %] 99 % (06/08 1457) Blood pressure 128/69, pulse 76, temperature (!) 97 F (36.1 C), temperature source Temporal, resp. rate 13, height 5\' 5"  (1.651 m), weight 103.9 kg, SpO2 99%. Body mass index is 38.12 kg/m.  Physical Exam Vitals and nursing note reviewed.  Constitutional:      General: She is not in acute distress.    Appearance: She is obese. She is ill-appearing and toxic-appearing. She is not diaphoretic.     Comments: Sad interpersonal style   Pulmonary:     Effort: Pulmonary effort is normal.  Skin:    Coloration: Skin is pale.  Neurological:     Mental Status: She is alert and oriented to person, place, and time.     Motor: No weakness, tremor or seizure activity.  Psychiatric:        Attention and Perception: Attention and perception normal. She does not perceive auditory or visual hallucinations.        Mood and Affect: Mood is depressed. Affect is tearful.        Speech: Speech normal.        Behavior: Behavior is cooperative.        Thought Content: Thought content is paranoid (About losing her daughter; believes parents might try to have her killed) and delusional (Believes parents might be attempting to have her killed). Thought content includes homicidal and suicidal ideation. Thought content includes homicidal (Wants to stab her parents) plan. Thought content does not include suicidal plan.        Cognition and Memory: Cognition and memory normal.        Judgment: Judgment is impulsive and inappropriate.    Mental Status Exam: General Appearance: Unkept, obese Caucasian female, who appears ill and toxic appearing, with sad  and depressed interpersonal style  Orientation:  Full (Time, Place, and Person)  Memory:  WDL  Concentration:  Concentration: Fair and Attention Span: Fair  Recall:  Fair  Attention  Fair  Eye Contact:  Minimal  Speech:  Clear and Coherent and  Normal Rate  Language:  Fair  Volume:  Normal  Mood: Depressed  Affect:  Tearful  Thought Process:  Coherent, Goal Directed, and Linear  Thought Content:  Negative  Suicidal Thoughts:  Yes.  without intent/plan  Homicidal Thoughts:  Yes.  with intent/plan  Judgement:  Poor  Insight:  Lacking  Psychomotor Activity:  Normal  Akathisia:  No  Fund of Knowledge:  Fair      Assets:  Manufacturing systems engineer Desire for Improvement Financial Resources/Insurance Housing Leisure Time Physical Health Resilience Social Support Talents/Skills Transportation Vocational/Educational  Cognition:  WNL  ADL's:  Intact  AIMS (if indicated):   0     Other History   These have been pulled in through the EMR, reviewed, and updated if appropriate.  Family History:  The patient's family history includes Asthma in her father; Mental illness in her father; PDD in her brother; Seizures in her brother.  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    Chronic constipation    Depression    Drug-seeking behavior    Essential tremor    Headache    Ineffective individual coping 05/16/2022   Insomnia 01/12/2022   Intentional drug overdose (HCC) 06/05/2022   Neuromuscular disorder (HCC)    Normocytic anemia 06/05/2022   Overdose 07/22/2017   Overdose of acetaminophen  07/2017   and other meds   Overdose, intentional self-harm, initial encounter (HCC) 07/20/2021   Paranoia (HCC) 04/22/2021   Paroxysmal atrial fibrillation (HCC)    Personality disorder (HCC)    Prolonged QT interval    Purposeful non-suicidal drug ingestion (HCC) 06/27/2021   Schizo-affective psychosis  (HCC)    Schizoaffective disorder (HCC) 07/29/2022   Schizoaffective disorder, bipolar type (HCC)    Seizures (HCC)    Last seizure December 2017   Skin erythema 04/27/2022   Sleep apnea    Suicidal behavior 07/25/2021   Suicidal ideation    Suicide (HCC) 07/01/2021   Suicide attempt (HCC) 07/04/2021    Surgical History: Past Surgical History:  Procedure Laterality Date   MOUTH SURGERY  2009 or 2010     Medications:   Current Facility-Administered Medications:    ibuprofen  (ADVIL ) tablet 400 mg, 400 mg, Oral, Q6H PRN, Foust, Katy L, NP   pantoprazole  (PROTONIX ) EC tablet 40 mg, 40 mg, Oral, Daily, Foust, Katy L, NP, 40 mg at 07/14/23 1101   polyethylene glycol (MIRALAX  / GLYCOLAX ) packet 17 g, 17 g, Oral, Daily PRN, Foust, Katy L, NP  Current Outpatient Medications:    albuterol  (VENTOLIN  HFA) 108 (90 Base) MCG/ACT inhaler, Inhale 2 puffs into the lungs every 6 (six) hours as needed for wheezing or shortness of breath., Disp: , Rfl:    benztropine  (COGENTIN ) 0.5 MG tablet, Take 0.5 mg by mouth 2 (two) times daily., Disp: , Rfl:    benztropine  (COGENTIN ) 1 MG tablet, Take 1 mg  by mouth at bedtime., Disp: , Rfl:    CAPLYTA 42 MG capsule, Take 42 mg by mouth daily., Disp: , Rfl:    DULoxetine  (CYMBALTA ) 60 MG capsule, Take 60 mg by mouth daily., Disp: , Rfl:    gabapentin  (NEURONTIN ) 600 MG tablet, Take 600 mg by mouth 3 (three) times daily., Disp: , Rfl:    losartan (COZAAR) 100 MG tablet, Take 100 mg by mouth daily., Disp: , Rfl:    metFORMIN  (GLUCOPHAGE ) 500 MG tablet, Take 1 tablet (500 mg total) by mouth daily with breakfast. (Patient taking differently: Take 500 mg by mouth 2 (two) times daily with a meal.), Disp: , Rfl:    metoprolol  tartrate (LOPRESSOR ) 25 MG tablet, Take 25 mg by mouth 2 (two) times daily., Disp: , Rfl:    OLANZapine  (ZYPREXA ) 10 MG tablet, Take 10 mg by mouth at bedtime., Disp: , Rfl:    pantoprazole  (PROTONIX ) 40 MG tablet, Take 40 mg by mouth daily.,  Disp: , Rfl:    prazosin  (MINIPRESS ) 2 MG capsule, Take 2 mg by mouth at bedtime., Disp: , Rfl:    propafenone  (RYTHMOL  SR) 425 MG 12 hr capsule, Take 425 mg by mouth in the morning and at bedtime., Disp: , Rfl:    risperiDONE  (RISPERDAL ) 3 MG tablet, Take 3 mg by mouth in the morning and at bedtime., Disp: , Rfl:    SYMBICORT 160-4.5 MCG/ACT inhaler, Inhale 2 puffs into the lungs in the morning and at bedtime., Disp: , Rfl:    traZODone  (DESYREL ) 100 MG tablet, Take 100 mg by mouth at bedtime., Disp: , Rfl:    UZEDY  200 MG/0.56ML SUSY, Inject 200 mg into the skin., Disp: , Rfl:   Allergies: Allergies  Allergen Reactions   Bee Venom Anaphylaxis   Coconut Flavoring Agent (Non-Screening) Anaphylaxis and Rash   Fish Allergy Anaphylaxis   Geodon  [Ziprasidone  Hcl] Other (See Comments)    Pt states that this medication causes paralysis of the mouth.     Haloperidol  And Related Other (See Comments)    Pt states that this medication causes paralysis of the mouth, jaw locks up   Lithobid [Lithium ] Other (See Comments)    Seizure-like activity    Roxicodone  [Oxycodone ] Other (See Comments)    Hallucinations    Seroquel  [Quetiapine ] Other (See Comments)    Severe drowsiness    Shellfish Allergy Anaphylaxis   Phenergan  [Promethazine  Hcl] Other (See Comments)    Chest pain     Prilosec [Omeprazole] Nausea And Vomiting and Other (See Comments)    Pt can take protonix  with no problems    Sulfa Antibiotics Other (See Comments)    Chest pain    Tegretol  [Carbamazepine ] Nausea And Vomiting   Prozac  [Fluoxetine ] Other (See Comments)    Increased Depression and Suicidal thoughts   Tape Other (See Comments)    Skin tears, can only tolerate paper tape.   Tylenol  [Acetaminophen ] Rash and Other (See Comments)    Rash on face     Volanda Gruber, NP

## 2023-07-14 NOTE — ED Notes (Signed)
 IVC is current

## 2023-07-14 NOTE — Progress Notes (Signed)
 Patient refuses assessment, stating that she does not want anyone to touch her. Patient is very upset that she is admitted to unit 2W, states that she is supposed to be at [facility]. MD notified

## 2023-07-15 NOTE — Progress Notes (Signed)
 Patient has been refusing vitals since AM. Attempted by the sitter and RN in afternoon, still refused.

## 2023-07-15 NOTE — TOC Progression Note (Signed)
 Transition of Care [facility]) - Progression Note    Patient Details  Name: [name] MRN: [id number] Date of Birth: [birth year]  [facility]) CM/SW Contact  Acen Craun A [place], LCSW Phone Number: 07/15/2023, 1:53 PM  Clinical Narrative:     No bed offers for inpatient psych. Bed offers still pending. Pt is medically stable for discharge, can DC once bed has been secured.    TOC will continue to follow.        Expected Discharge Plan and Services                                               Social Determinants of Health (SDOH) Interventions SDOH Screenings   Food Insecurity: No Food Insecurity (07/15/2023)  Housing: Low Risk  (07/15/2023)  Transportation Needs: No Transportation Needs (07/15/2023)  Utilities: Not At Risk (07/15/2023)  Alcohol  Screen: Low Risk  (12/19/2022)  Depression (PHQ2-9): Medium Risk (03/10/2022)  Tobacco Use: Medium Risk (07/13/2023)    Readmission Risk Interventions    06/06/2022    3:38 PM  Readmission Risk Prevention Plan  Transportation Screening Complete  Medication Review (RN Care Manager) Complete  PCP or Specialist appointment within 3-5 days of discharge Complete  HRI or Home Care Consult Complete  SW Recovery Care/Counseling Consult Complete  Palliative Care Screening Not Applicable  Skilled Nursing Facility Not Applicable

## 2023-07-15 NOTE — Plan of Care (Signed)

## 2023-07-15 NOTE — Discharge Summary (Signed)
 Physician Discharge Summary  [name] ZOX:[id number] DOB: April 21, 1990 DOA: 07/13/2023  PCP: [name]  Admit date: 07/13/2023 Discharge date: 07/15/2023  Time spent: 35 minutes  Recommendations for Outpatient Follow-up:  Inpatient psych   Discharge Diagnoses:  Principal Problem:   Suicide attempt by drug ingestion [facility]) Active Problems:   Borderline personality disorder (HCC)   Schizoaffective disorder, bipolar type (HCC)   Overdose History of paroxysmal A-fib Borderline diabetes  Discharge Condition: Stable  Diet recommendation: Carb modified  Filed Weights   07/13/23 0713  Weight: 103.9 kg    History of present illness:  32/F with history of depression, schizoaffective disorder, borderline personality, multiple prior suicide attempts presented to the ED after intentional overdose 6/7 AM taking 3 handfuls of metoprolol , metformin , trazodone , Cogentin , Cymbalta , prazosin  and gabapentin  -She was drowsy and admitted for further observation, given activated charcoal  Hospital Course:  ntentional drug overdose Suicide attempt Patient reports taking 3 handfuls of metoprolol , metformin , caplyta, trazodone , Cogentin , Cymbalta , prazosin , and gabapentin   - 1:1 suicide monitoring -Seen by psychiatry in consultation - Stable and improved now, she is medically stable for discharge to behavioral health when bed available   Schizoaffective disorder, bipolar type Borderline personality disorder PTSD Generalized anxiety disorder - Hold home meds in setting of overdose    Hypokalemia  Hypomagnesemia - Repleted   P.Atrial fibrillation - Metoprolol  on hold, she is in sinus rhythm at this time, heart rate controlled - Patient not on outpatient anticoagulation and with CHA2DS2-VASc score of  1 no indication to start   #Prediabetes - Hold home metformin  for now   #Morbid Obesity Body mass index is 38.12 kg/m. - Needs diet and lifestyle modification     Consultations: Psychiatry  Discharge Exam: Vitals:   07/15/23 0009 07/15/23 0346  BP: 125/74 119/70  Pulse: 75 76  Resp: 15 15  Temp: 98 F (36.7 C) 98.4 F (36.9 C)  SpO2: 97% 97%    Gen: Awake, Alert, Oriented X 3, flat affect HEENT: no JVD Lungs: Good air movement bilaterally, CTAB CVS: S1S2/RRR Abd: soft, Non tender, non distended, BS present Extremities: No edema Skin: no new rashes on exposed skin   Discharge Instructions    Allergies as of 07/15/2023       Reactions   Bee Venom Anaphylaxis   Coconut Flavoring Agent (non-screening) Anaphylaxis, Rash   Fish Allergy Anaphylaxis   Geodon  [ziprasidone  Hcl] Other (See Comments)   Pt states that this medication causes paralysis of the mouth.     Haloperidol  And Related Other (See Comments)   Pt states that this medication causes paralysis of the mouth, jaw locks up   Lithobid [lithium ] Other (See Comments)   Seizure-like activity   Roxicodone  [oxycodone ] Other (See Comments)   Hallucinations    Seroquel  [quetiapine ] Other (See Comments)   Severe drowsiness    Shellfish Allergy Anaphylaxis   Phenergan  [promethazine  Hcl] Other (See Comments)   Chest pain    Prilosec [omeprazole] Nausea And Vomiting, Other (See Comments)   Pt can take protonix  with no problems   Sulfa Antibiotics Other (See Comments)   Chest pain    Tegretol  [carbamazepine ] Nausea And Vomiting   Prozac  [fluoxetine ] Other (See Comments)   Increased Depression and Suicidal thoughts   Tape Other (See Comments)   Skin tears, can only tolerate paper tape.   Tylenol  [acetaminophen ] Rash, Other (See Comments)   Rash on face         Medication List     STOP  taking these medications    albuterol  108 (90 Base) MCG/ACT inhaler Commonly known as: VENTOLIN  HFA   benztropine  0.5 MG tablet Commonly known as: COGENTIN    benztropine  1 MG tablet Commonly known as: COGENTIN    Caplyta 42 MG capsule Generic drug: lumateperone tosylate    DULoxetine  60 MG capsule Commonly known as: CYMBALTA    gabapentin  600 MG tablet Commonly known as: NEURONTIN    losartan 100 MG tablet Commonly known as: COZAAR   metFORMIN  500 MG tablet Commonly known as: GLUCOPHAGE    metoprolol  tartrate 25 MG tablet Commonly known as: LOPRESSOR    OLANZapine  10 MG tablet Commonly known as: ZYPREXA    pantoprazole  40 MG tablet Commonly known as: PROTONIX    prazosin  2 MG capsule Commonly known as: MINIPRESS    propafenone  425 MG 12 hr capsule Commonly known as: RYTHMOL  SR   risperiDONE  3 MG tablet Commonly known as: RISPERDAL    Symbicort 160-4.5 MCG/ACT inhaler Generic drug: budesonide -formoterol    traZODone  100 MG tablet Commonly known as: DESYREL    Uzedy  200 MG/0.56ML Susy Generic drug: risperiDONE  ER       Allergies  Allergen Reactions   Bee Venom Anaphylaxis   Coconut Flavoring Agent (Non-Screening) Anaphylaxis and Rash   Fish Allergy Anaphylaxis   Geodon  [Ziprasidone  Hcl] Other (See Comments)    Pt states that this medication causes paralysis of the mouth.     Haloperidol  And Related Other (See Comments)    Pt states that this medication causes paralysis of the mouth, jaw locks up   Lithobid [Lithium ] Other (See Comments)    Seizure-like activity    Roxicodone  [Oxycodone ] Other (See Comments)    Hallucinations    Seroquel  [Quetiapine ] Other (See Comments)    Severe drowsiness    Shellfish Allergy Anaphylaxis   Phenergan  [Promethazine  Hcl] Other (See Comments)    Chest pain     Prilosec [Omeprazole] Nausea And Vomiting and Other (See Comments)    Pt can take protonix  with no problems    Sulfa Antibiotics Other (See Comments)    Chest pain    Tegretol  [Carbamazepine ] Nausea And Vomiting   Prozac  [Fluoxetine ] Other (See Comments)    Increased Depression and Suicidal thoughts   Tape Other (See Comments)    Skin tears, can only tolerate paper tape.   Tylenol  [Acetaminophen ] Rash and Other (See Comments)    Rash  on face       The results of significant diagnostics from this hospitalization (including imaging, microbiology, ancillary and laboratory) are listed below for reference.    Significant Diagnostic Studies: DG Chest Portable 1 View Result Date: 06/17/2023 CLINICAL DATA:  Chest pain and shortness of breath. EXAM: PORTABLE CHEST 1 VIEW COMPARISON:  Two-view chest x-ray 3/10/5. FINDINGS: The lung volumes lordotic positioning exaggerate the heart size. Mild pulmonary vascular congestion is present without frank edema. No focal airspace disease present. No effusion or pneumothorax is present. The visualized soft tissues and bony thorax are unremarkable. IMPRESSION: Mild pulmonary vascular congestion without frank edema. Electronically Signed   By: Audree Leas M.D.   On: 06/17/2023 07:20    Microbiology: No results found for this or any previous visit (from the past 240 hours).   Labs: Basic Metabolic Panel: Recent Labs  Lab 07/13/23 0715 07/14/23 0542  NA 141 137  K 3.6 3.5  CL 109 108  CO2 22 23  GLUCOSE 110* 92  BUN <5* 5*  CREATININE 0.68 0.70  CALCIUM  9.1 8.2*  MG 1.5* 1.9   Liver Function Tests: Recent Labs  Lab 07/13/23 0715 07/14/23 0542  AST 20 19  ALT 18 18  ALKPHOS 65 55  BILITOT 0.3 0.4  PROT 6.9 5.9*  ALBUMIN 3.7 3.1*   No results for input(s): "LIPASE", "AMYLASE" in the last 168 hours. No results for input(s): "AMMONIA" in the last 168 hours. CBC: Recent Labs  Lab 07/13/23 0715 07/14/23 0542  WBC 7.6 6.9  NEUTROABS 4.5  --   HGB 11.5* 10.7*  HCT 37.2 33.6*  MCV 81.4 81.2  PLT 263 253   Cardiac Enzymes: No results for input(s): "CKTOTAL", "CKMB", "CKMBINDEX", "TROPONINI" in the last 168 hours. BNP: BNP (last 3 results) No results for input(s): "BNP" in the last 8760 hours.  ProBNP (last 3 results) No results for input(s): "PROBNP" in the last 8760 hours.  CBG: No results for input(s): "GLUCAP" in the last 168  hours.     Signed:  Deforest Fast MD.  [facility] 07/15/2023, 10:18 AM

## 2023-07-16 MED ORDER — IBUPROFEN 200 MG PO TABS
200.0000 mg | ORAL_TABLET | Freq: Four times a day (QID) | ORAL | Status: DC | PRN
  Administered 2023-07-16 – 2023-07-17 (×2): 200 mg via ORAL
  Filled 2023-07-16 (×2): qty 1

## 2023-07-16 NOTE — Progress Notes (Signed)
 Patient seen and examined, sleeping comfortably, sitter at bedside, apparently did not sleep much overnight -No changes from my discharge summary done yesterday, remains medically stable for inpatient psych awaiting bed availability  Deforest Fast, MD

## 2023-07-16 NOTE — TOC Progression Note (Signed)
 Transition of Care [facility]) - Progression Note    Patient Details  Name: [name] MRN: [id number] Date of Birth: November 07, 1990  Transition of Care [facility]) CM/SW Contact  Raidyn Breiner A [place], LCSW Phone Number: 07/16/2023, 4:45 PM  Clinical Narrative:     Pt has no bed offers, CSW sent out referral for bed placement to the following inpatient psych facilities. Updated bed offers pending.     [facility] [address] [place] [address] [phone]  [facility] [address], [place] [address] [phone]  [facility] [address] [place], [place] [address] [phone]  [facility] Patient Placement [facility], [place] [phone]  [facility] [address] [place] [address] [phone]  [facility] Center-Ad[facility] [address] Johnella Naas [place] [address] [phone]  [facility] [address] [place] [address] [phone]  [facility] EFAX [address], [place] [phone]  [facility] [address] [place] [address] [phone]  [facility] [address] [place] [address] [phone]  [facility] [address] [place], [place] [address] [phone]  [facility] [address] [place] [address] [phone]  [facility] [address] [place] [address] [phone]  [facility] [address] [place] [address] [phone]  [facility] [address], [place]  [place] [address] [phone]  [facility] [address] [place] [address] [phone]         TOC will continue to follow.       Expected Discharge Plan and Services                                               Social Determinants of Health (SDOH) Interventions SDOH Screenings   Food Insecurity: No Food Insecurity (07/15/2023)  Housing: Low Risk  (07/15/2023)  Transportation Needs: No Transportation Needs (07/15/2023)  Utilities: Not At Risk (07/15/2023)  Alcohol  Screen: Low Risk  (12/19/2022)  Depression (PHQ2-9): Medium Risk (03/10/2022)  Tobacco Use: Medium Risk (07/13/2023)    Readmission Risk Interventions    06/06/2022    3:38 PM  Readmission Risk Prevention Plan  Transportation Screening Complete  Medication Review (RN Care Manager) Complete  PCP or Specialist appointment within 3-5 days of discharge Complete  HRI or Home Care Consult Complete  SW Recovery Care/Counseling Consult Complete  Palliative Care Screening Not Applicable  Skilled Nursing Facility Not Applicable

## 2023-07-16 NOTE — Care Management Important Message (Signed)
 Important Message  Patient Details  Name: [name] MRN: [id number] Date of Birth: 12-23-1990   Important Message Given:  Yes - Medicare IM     Wynonia Hedges 07/16/2023, 2:12 PM

## 2023-07-16 NOTE — Plan of Care (Signed)

## 2023-07-17 ENCOUNTER — Encounter: Admitting: Obstetrics and Gynecology

## 2023-07-17 DIAGNOSIS — T50902D Poisoning by unspecified drugs, medicaments and biological substances, intentional self-harm, subsequent encounter: Secondary | ICD-10-CM

## 2023-07-17 NOTE — Progress Notes (Signed)
 Report given to [facility].  All questions answered.

## 2023-07-17 NOTE — Progress Notes (Signed)
 [facility] requested blood glucose to be completed today, but pt refuses at this time.

## 2023-07-17 NOTE — Plan of Care (Signed)

## 2023-07-17 NOTE — TOC Transition Note (Signed)
 Transition of Care [facility]) - Discharge Note   Patient Details  Name: [name] MRN: [id number] Date of Birth: 22-Aug-1990  Transition of Care [facility]) CM/SW Contact:  Levy Wellman A [place], LCSW Phone Number: 07/17/2023, 12:50 PM   Clinical Narrative:     Pt has been accepted to [facility] for inpatient psych admission.  CSW notified pt is medically stable for DC. Nursing contacted [phone] for report.  [facility] Department contacted for transport.  Pt declined collateral contact to notify of DC.  CSW contacted [facility], spoke with staff member, stated pt is no longer using their services, states she does not have a legal guardian.   No other needs identified at this time. TOC will sign off, please consult again if TOC needs arise.     Final next level of care: Psychiatric Hospital Barriers to Discharge: Barriers Resolved   Patient Goals and CMS Choice            Discharge Placement              Patient chooses bed at:  [facility]) Patient to be transferred to facility by: [facility] Department Name of family member notified: pt declined Patient and family notified of of transfer: 07/17/23  Discharge Plan and Services Additional resources added to the After Visit Summary for                                       Social Drivers of Health (SDOH) Interventions SDOH Screenings   Food Insecurity: No Food Insecurity (07/15/2023)  Housing: Low Risk  (07/15/2023)  Transportation Needs: No Transportation Needs (07/15/2023)  Utilities: Not At Risk (07/15/2023)  Alcohol  Screen: Low Risk  (12/19/2022)  Depression (PHQ2-9): Medium Risk (03/10/2022)  Tobacco Use: Medium Risk (07/13/2023)     Readmission Risk Interventions    06/06/2022    3:38 PM  Readmission Risk Prevention Plan  Transportation Screening Complete  Medication Review (RN Care Manager) Complete  PCP or Specialist appointment within 3-5 days of  discharge Complete  HRI or Home Care Consult Complete  SW Recovery Care/Counseling Consult Complete  Palliative Care Screening Not Applicable  Skilled Nursing Facility Not Applicable

## 2023-07-17 NOTE — Progress Notes (Signed)
 Police officers here to transport pt to [employer].  Sitter is going to wheel her down via wheelchair to police vehicle.  Officer has her phone, Consulting civil engineer, and wallet.  EMTALA form given to officer.  Pt tearful.

## 2023-07-27 IMAGING — DX DG CHEST 2V
2 series · 2 of 2 positions shown · non-contrast
Comparison: 05/30/2021

CLINICAL DATA: Cough

EXAM:
CHEST - 2 VIEW

[chest pa]
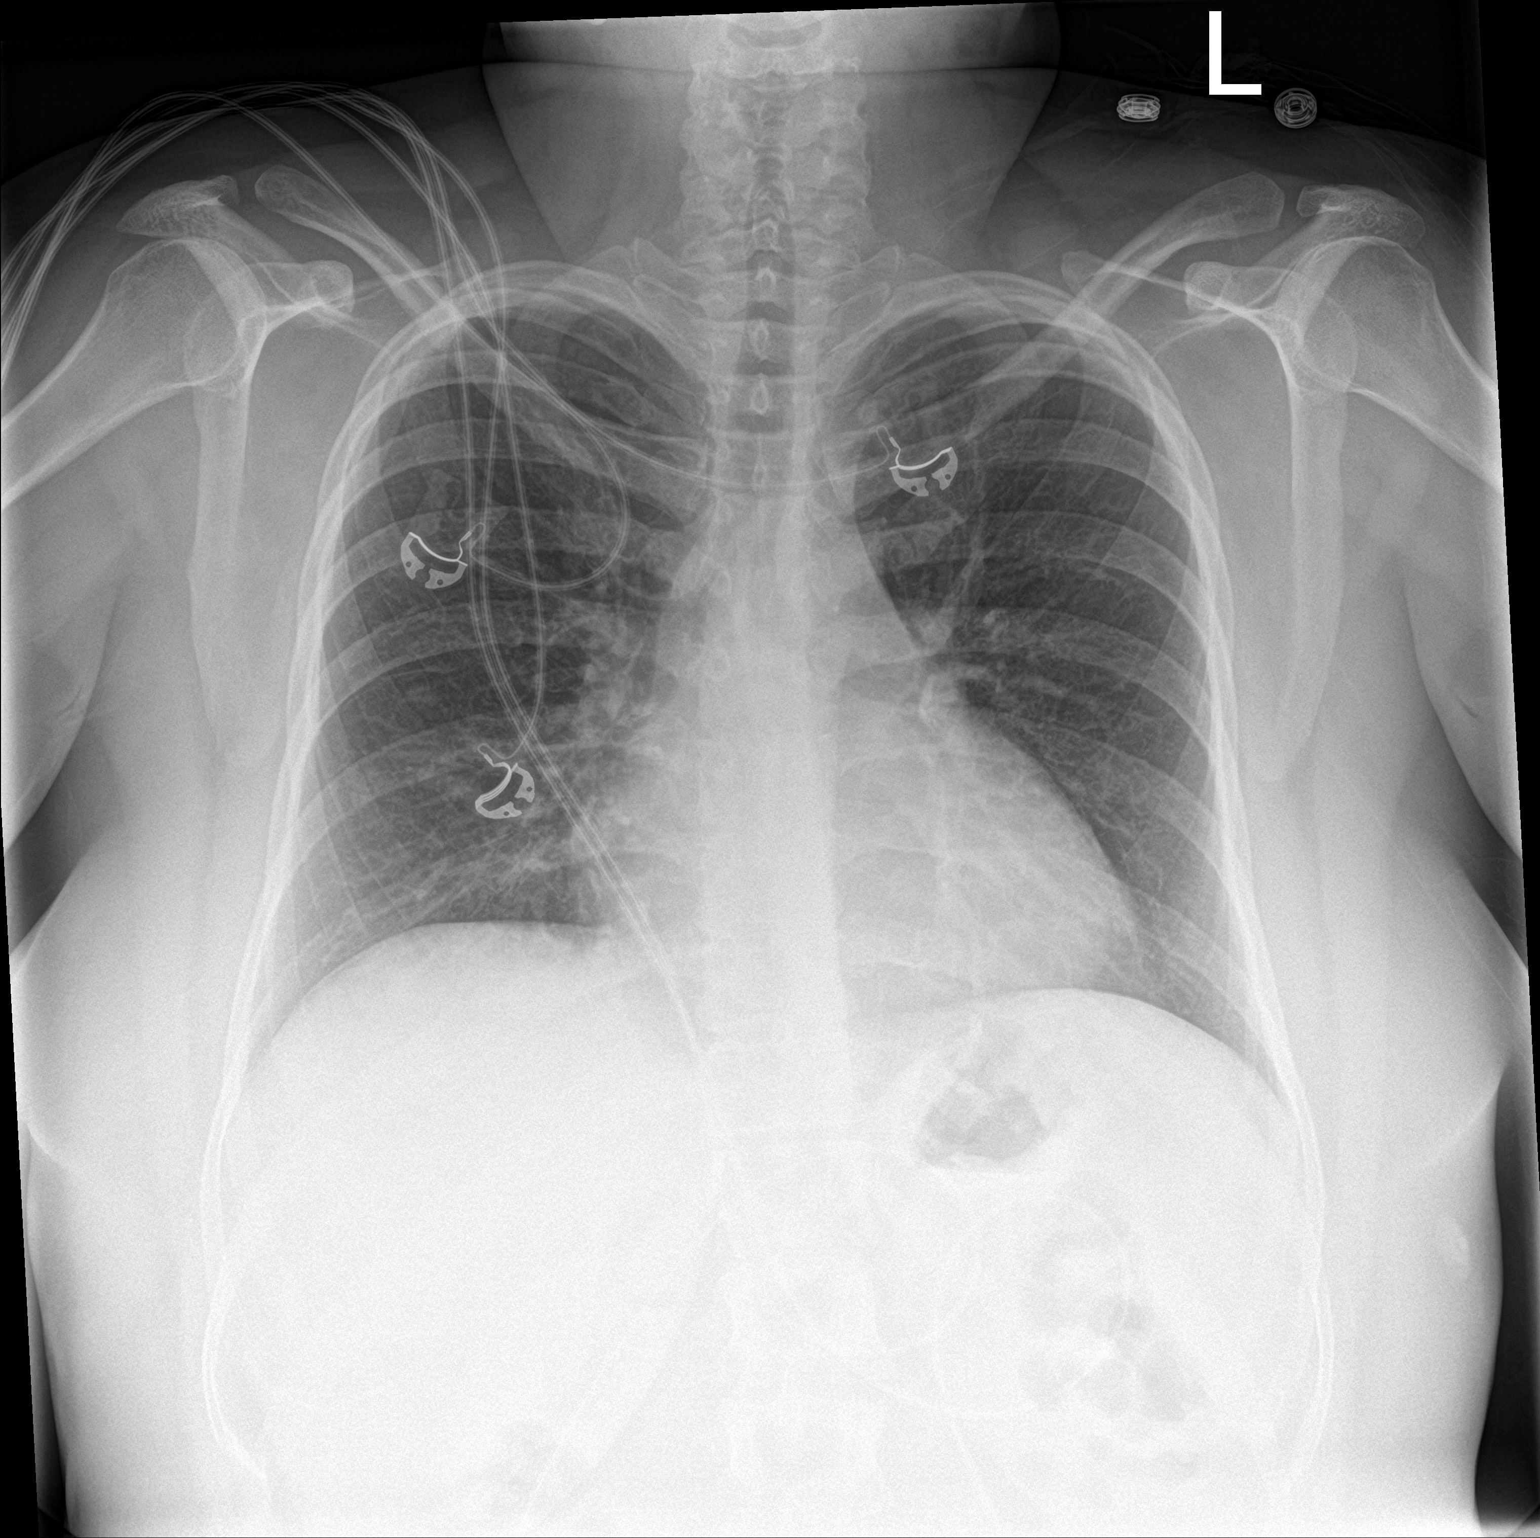

[chest lat]
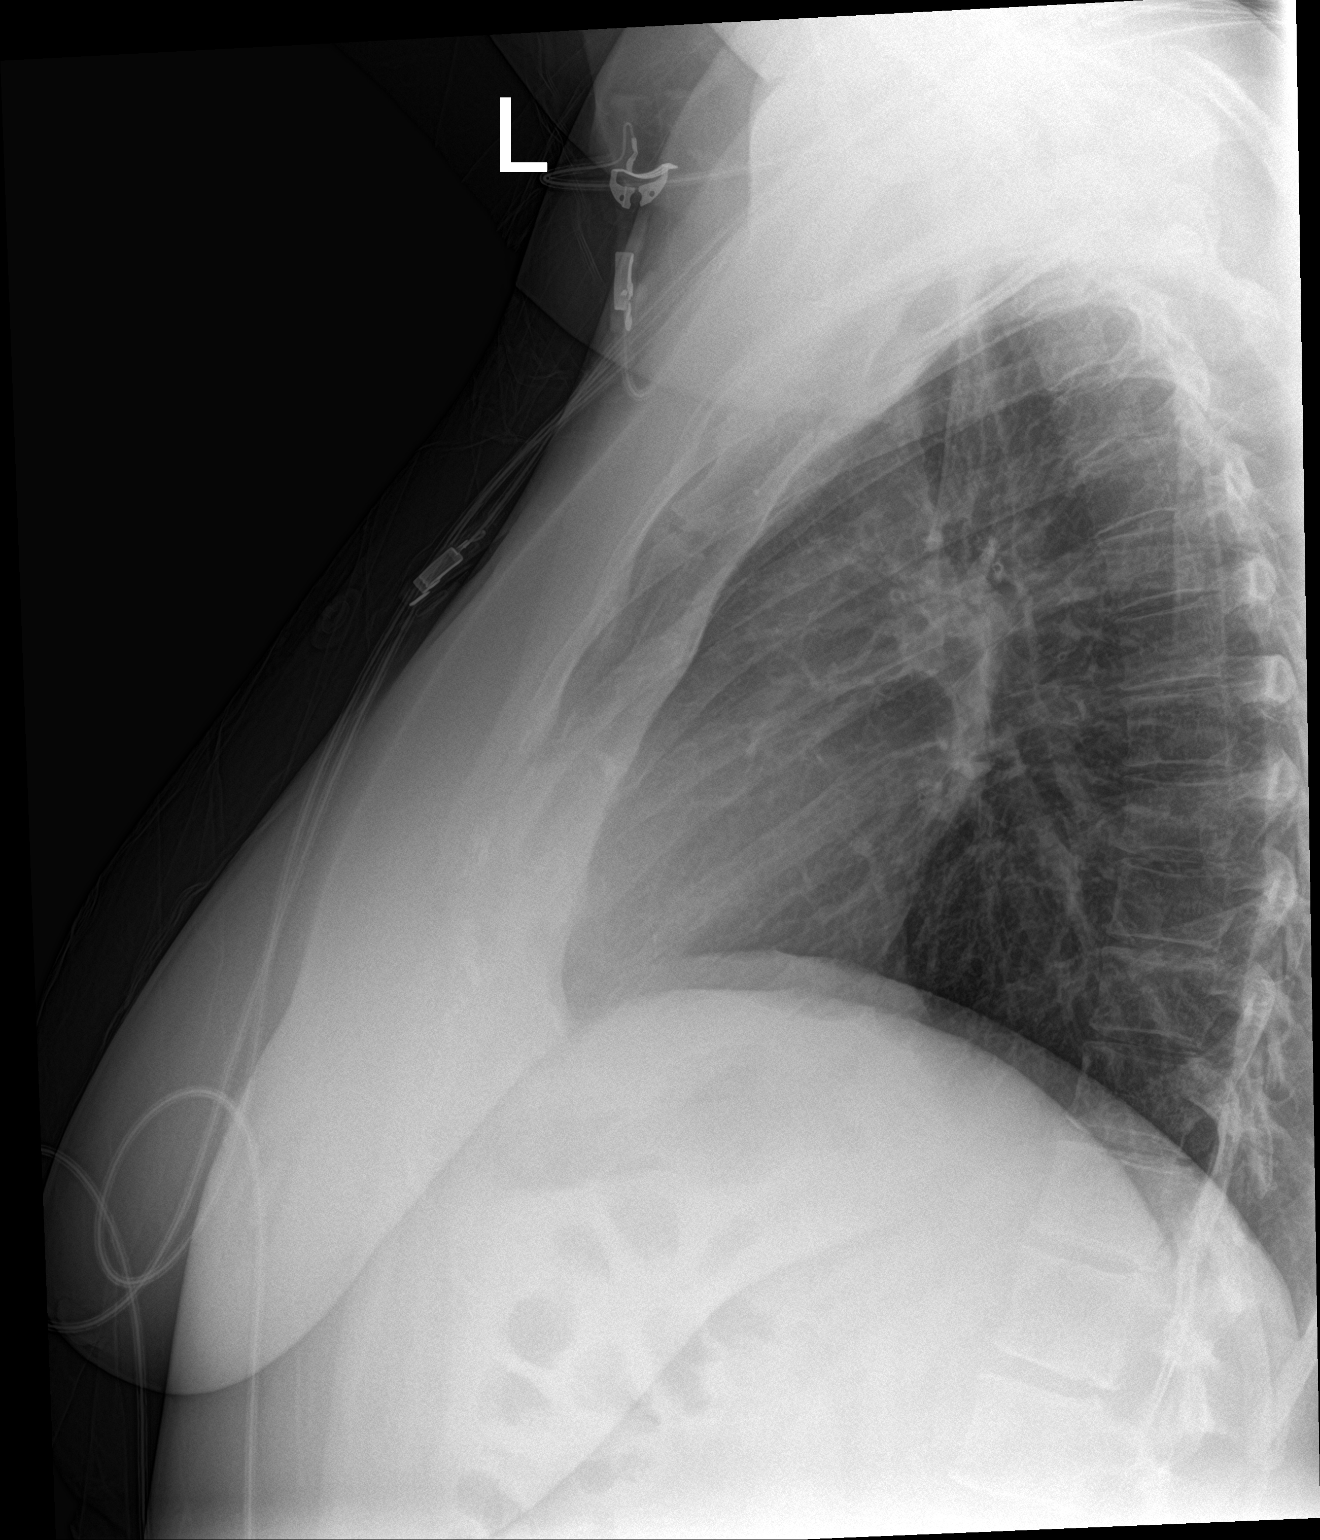

[2 of 2 positions shown; findings below may reference images not displayed]

FINDINGS: Lungs are clear.  No pleural effusion or pneumothorax.

The heart is normal in size.

Visualized osseous structures are within normal limits.
IMPRESSION: Normal chest radiographs.

## 2023-07-31 ENCOUNTER — Ambulatory Visit (HOSPITAL_COMMUNITY)
Admission: EM | Admit: 2023-07-31 | Discharge: 2023-07-31 | Disposition: A | Discharge status: Other Acute Inpatient Hospital | Attending: Nurse Practitioner | Admitting: Nurse Practitioner

## 2023-07-31 DIAGNOSIS — F411 Generalized anxiety disorder: Secondary | ICD-10-CM | POA: Diagnosis not present

## 2023-07-31 DIAGNOSIS — R45851 Suicidal ideations: Secondary | ICD-10-CM | POA: Diagnosis present

## 2023-07-31 DIAGNOSIS — F251 Schizoaffective disorder, depressive type: Secondary | ICD-10-CM | POA: Insufficient documentation

## 2023-07-31 DIAGNOSIS — Z9151 Personal history of suicidal behavior: Secondary | ICD-10-CM | POA: Diagnosis not present

## 2023-07-31 DIAGNOSIS — F603 Borderline personality disorder: Secondary | ICD-10-CM | POA: Diagnosis not present

## 2023-07-31 DIAGNOSIS — F4310 Post-traumatic stress disorder, unspecified: Secondary | ICD-10-CM | POA: Diagnosis not present

## 2023-07-31 DIAGNOSIS — Z79899 Other long term (current) drug therapy: Secondary | ICD-10-CM | POA: Insufficient documentation

## 2023-07-31 LAB — POCT URINE DRUG SCREEN - MANUAL ENTRY (I-SCREEN)
POC Amphetamine UR: NOT DETECTED
POC Buprenorphine (BUP): NOT DETECTED
POC Cocaine UR: NOT DETECTED
POC Marijuana UR: NOT DETECTED
POC Methadone UR: NOT DETECTED
POC Methamphetamine UR: NOT DETECTED
POC Morphine: NOT DETECTED
POC Oxazepam (BZO): NOT DETECTED
POC Oxycodone UR: NOT DETECTED
POC Secobarbital (BAR): NOT DETECTED

## 2023-07-31 LAB — CBC WITH DIFFERENTIAL/PLATELET
Abs Immature Granulocytes: 0.02 10*3/uL (ref 0.00–0.07)
Basophils Absolute: 0 10*3/uL (ref 0.0–0.1)
Basophils Relative: 0 %
Eosinophils Absolute: 0.1 10*3/uL (ref 0.0–0.5)
Eosinophils Relative: 1 %
HCT: 36 % (ref 36.0–46.0)
Hemoglobin: 11.5 g/dL — ABNORMAL LOW (ref 12.0–15.0)
Immature Granulocytes: 0 %
Lymphocytes Relative: 25 %
Lymphs Abs: 2.3 10*3/uL (ref 0.7–4.0)
MCH: 25.8 pg — ABNORMAL LOW (ref 26.0–34.0)
MCHC: 31.9 g/dL (ref 30.0–36.0)
MCV: 80.7 fL (ref 80.0–100.0)
Monocytes Absolute: 0.6 10*3/uL (ref 0.1–1.0)
Monocytes Relative: 7 %
Neutro Abs: 6.1 10*3/uL (ref 1.7–7.7)
Neutrophils Relative %: 67 %
Platelets: 250 10*3/uL (ref 150–400)
RBC: 4.46 MIL/uL (ref 3.87–5.11)
RDW: 14.8 % (ref 11.5–15.5)
WBC: 9.1 10*3/uL (ref 4.0–10.5)
nRBC: 0 % (ref 0.0–0.2)

## 2023-07-31 LAB — COMPREHENSIVE METABOLIC PANEL WITH GFR
ALT: 20 U/L (ref 0–44)
AST: 17 U/L (ref 15–41)
Albumin: 4.1 g/dL (ref 3.5–5.0)
Alkaline Phosphatase: 58 U/L (ref 38–126)
Anion gap: 10 (ref 5–15)
BUN: 12 mg/dL (ref 6–20)
CO2: 23 mmol/L (ref 22–32)
Calcium: 9.4 mg/dL (ref 8.9–10.3)
Chloride: 105 mmol/L (ref 98–111)
Creatinine, Ser: 0.82 mg/dL (ref 0.44–1.00)
GFR, Estimated: >60 mL/min (ref >60)
Glucose, Bld: 92 mg/dL (ref 70–99)
Potassium: 3.8 mmol/L (ref 3.5–5.1)
Sodium: 138 mmol/L (ref 135–145)
Total Bilirubin: 0.5 mg/dL (ref 0.0–1.2)
Total Protein: 6.8 g/dL (ref 6.5–8.1)

## 2023-07-31 LAB — POC URINE PREG, ED: Preg Test, Ur: NEGATIVE

## 2023-07-31 LAB — POC SARS CORONAVIRUS 2 AG: SARSCOV2ONAVIRUS 2 AG: NEGATIVE

## 2023-07-31 MED ORDER — MELATONIN 3 MG PO TABS: 3.0000 mg | ORAL_TABLET | Freq: Every evening | ORAL | Status: DC | PRN

## 2023-07-31 MED ORDER — OLANZAPINE 5 MG PO TBDP: 5.0000 mg | ORAL_TABLET | Freq: Three times a day (TID) | ORAL | Status: DC | PRN

## 2023-07-31 MED ORDER — TRAZODONE HCL 50 MG PO TABS: 50.0000 mg | ORAL_TABLET | Freq: Every evening | ORAL | Status: DC | PRN

## 2023-07-31 MED ORDER — OLANZAPINE 10 MG IM SOLR: 5.0000 mg | Freq: Three times a day (TID) | INTRAMUSCULAR | Status: DC | PRN

## 2023-07-31 MED ORDER — METOPROLOL TARTRATE 25 MG PO TABS
25.0000 mg | ORAL_TABLET | Freq: Two times a day (BID) | ORAL | Status: DC
  Administered 2023-07-31: 25 mg via ORAL
  Filled 2023-07-31: qty 1

## 2023-07-31 MED ORDER — HYDROXYZINE HCL 25 MG PO TABS: 25.0000 mg | ORAL_TABLET | Freq: Three times a day (TID) | ORAL | Status: DC | PRN

## 2023-07-31 MED ORDER — TRAZODONE HCL 50 MG PO TABS: 50.0000 mg | ORAL_TABLET | Freq: Every day | ORAL | Status: DC

## 2023-07-31 MED ORDER — PRAZOSIN HCL 2 MG PO CAPS: 3.0000 mg | ORAL_CAPSULE | Freq: Every day | ORAL | Status: DC

## 2023-07-31 MED ORDER — OLANZAPINE 10 MG IM SOLR: 10.0000 mg | Freq: Three times a day (TID) | INTRAMUSCULAR | Status: DC | PRN

## 2023-07-31 MED ORDER — ALUM & MAG HYDROXIDE-SIMETH 200-200-20 MG/5ML PO SUSP: 30.0000 mL | Freq: Four times a day (QID) | ORAL | Status: DC | PRN

## 2023-07-31 MED ORDER — RISPERIDONE 2 MG PO TBDP
3.0000 mg | ORAL_TABLET | Freq: Two times a day (BID) | ORAL | Status: DC
  Administered 2023-07-31: 3 mg via ORAL
  Filled 2023-07-31: qty 1

## 2023-07-31 NOTE — Discharge Instructions (Addendum)
 Transferred to [facility] regional -Elspeth Gory MD

## 2023-07-31 NOTE — ED Provider Notes (Signed)
 FBC/OBS ASAP Discharge Summary  Date and Time: 07/31/2023 10:30 AM  Name: Aniylah Ward  MRN:  [id number]   Discharge Diagnoses:  Final diagnoses:  Borderline personality disorder (HCC)  Passive suicidal ideations  Schizoaffective disorder, depressive type (HCC)    Subjective: I'm still suicidal and told them I was  Stay Summary: [name] was admitted to [facility] Continuous Assessment unit on 07/30/2023 after presenting to [facility] reporting thoughts of suicidal ideations and self-harm.  Patient was only discharged from [facility] on 07/30/2023 and she tells this Clinical research associate that she reported to the treatment team that she was actively suicidal prior to discharge and was subsequently discharged home any way.  Patient subsequently presented here to [facility] with complaints of suicidal ideations and was admitted to the continuous assessment unit with recommendations for inpatient psychiatric treatment. There is very well-known to the behavioral health service line as she has had multiple ED encounters and [facility] encounters related to suicidality and most recently was hospitalized at [facility], hospital after presenting to the emergency department after an intentional overdose with a plan to kill herself on 07/13/2023.  Patient was sent to [facility] following that overdose psychiatric attempt.  According to admission note patient was hospitalized at [facility] for a total of 12 days.  As patient was recommended for inpatient psychiatric treatment she has been accepted to [facility] for inpatient psychiatric stabilization.  Patient remains voluntarily and is in agreement to continue voluntary admission and be transferred to [facility].  Patient has not required any agitation medications during this brief admission at [facility].    To Upon completion of this admission the [name] was both mentally and medically stable for continues to endorse  suicidal ideations however denies homicidal ideations, auditory and visual tactile hallucinations delusional thoughts and she does not appear to be paranoid or responding to internal or external stimuli.    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    Chronic constipation    Depression    Drug-seeking behavior    Essential tremor    Headache    Ineffective individual coping 05/16/2022   Insomnia 01/12/2022   Intentional drug overdose (HCC) 06/05/2022   Neuromuscular disorder (HCC)    Normocytic anemia 06/05/2022   Overdose 07/22/2017   Overdose of acetaminophen  07/2017   and other meds   Overdose, intentional self-harm, initial encounter (HCC) 07/20/2021   Paranoia (HCC) 04/22/2021   Paroxysmal atrial fibrillation (HCC)    Personality disorder (HCC)    Prolonged QT interval    Purposeful non-suicidal drug ingestion (HCC) 06/27/2021   Schizo-affective psychosis (HCC)    Schizoaffective disorder (HCC) 07/29/2022   Schizoaffective disorder, bipolar type (HCC)    Seizures (HCC)    Last seizure December 2017   Skin erythema 04/27/2022   Sleep apnea    Suicidal behavior 07/25/2021   Suicidal ideation    Suicide (HCC) 07/01/2021   Suicide attempt (HCC)  07/04/2021    Family history :  Family History  Problem Relation Age of Onset   Mental illness Father    Asthma Father    PDD Brother    Seizures Brother     Family Psychiatric History:review fam hx   Social History: Unemployed, has a child  that are currently in the custody of family Tobacco Cessation:  N/A, patient does not currently use tobacco products  Current Medications:  Current Facility-Administered Medications  Medication Dose Route Frequency Provider Last Rate Last Admin   alum & mag hydroxide-simeth (MAALOX/MYLANTA) 200-200-20 MG/5ML suspension 30 mL  30 mL Oral Q6H PRN Onuoha, Chinwendu V, NP       hydrOXYzine  (ATARAX ) tablet 25 mg  25 mg Oral TID PRN Onuoha, Chinwendu V, NP       metoprolol  tartrate (LOPRESSOR ) tablet 25 mg  25 mg Oral BID Onuoha, Chinwendu V, NP   25 mg at 07/31/23 9071   OLANZapine  (ZYPREXA ) injection 10 mg  10 mg Intramuscular TID PRN Onuoha, Chinwendu V, NP       OLANZapine  (ZYPREXA ) injection 5 mg  5 mg Intramuscular TID PRN Onuoha, Chinwendu V, NP       OLANZapine  zydis (ZYPREXA ) disintegrating tablet 5 mg  5 mg Oral TID PRN Onuoha, Chinwendu V, NP       prazosin  (MINIPRESS ) capsule 3 mg  3 mg Oral QHS Onuoha, Chinwendu V, NP       risperiDONE  (RISPERDAL  M-TABS) disintegrating tablet 3 mg  3 mg Oral BID Onuoha, Chinwendu V, NP   3 mg at 07/31/23 9071   traZODone  (DESYREL ) tablet 50 mg  50 mg Oral QHS Onuoha, Chinwendu V, NP       No current outpatient medications on file.    PTA Medications:  Facility Ordered Medications  Medication   OLANZapine  zydis (ZYPREXA ) disintegrating tablet 5 mg   OLANZapine  (ZYPREXA ) injection 5 mg   OLANZapine  (ZYPREXA ) injection 10 mg   hydrOXYzine  (ATARAX ) tablet 25 mg   risperiDONE  (RISPERDAL  M-TABS) disintegrating tablet 3 mg   metoprolol  tartrate (LOPRESSOR ) tablet 25 mg   prazosin  (MINIPRESS ) capsule 3 mg   traZODone  (DESYREL ) tablet 50 mg   alum & mag hydroxide-simeth (MAALOX/MYLANTA) 200-200-20 MG/5ML suspension 30 mL       03/10/2022   10:01 PM 03/10/2022    9:56 PM 02/10/2022    1:49 AM  Depression screen PHQ 2/9  Decreased Interest 1 1 2   Down, Depressed, Hopeless 1 1 2   PHQ - 2 Score 2 2 4   Altered  sleeping 1 1 2   Tired, decreased energy 1 1 2   Change in appetite 1 0 2  Feeling bad or failure about yourself  1 1 2   Trouble concentrating 1 1 2   Moving slowly or fidgety/restless 1 1 1   Suicidal thoughts 1 1 1   PHQ-9 Score 9 8 16   Difficult doing work/chores Somewhat difficult  Very difficult    Flowsheet Row ED from 07/31/2023 in [facility] ED to Hosp-Admission (Discharged) from 07/13/2023 in [facility] ED from 07/05/2023 in [facility] Emergency Department at [facility]  C-SSRS RISK CATEGORY High Risk High Risk Error: Q3, 4, or 5 should not be populated when Q2 is No    Musculoskeletal  Strength & Muscle Tone: within normal limits Gait & Station: normal Patient leans: N/A  Psychiatric Specialty Exam  Presentation  General Appearance:  Casual  Eye Contact: Fair  Speech: Clear and Coherent  Speech  Volume: Normal  Handedness: Right   Mood and Affect  Mood: Anxious  Affect: Congruent   Thought Process  Thought Processes: Coherent; Goal Directed  Descriptions of Associations:Intact  Orientation:Partial  Thought Content:WDL  Diagnosis of Schizophrenia or Schizoaffective disorder in past: Yes  Duration of Psychotic Symptoms: Greater than six months   Hallucinations:Hallucinations: None  Ideas of Reference:None  Suicidal Thoughts:Suicidal Thoughts: Yes, Passive SI Passive Intent and/or Plan: Without Plan; With Means to Carry Out  Homicidal Thoughts:Homicidal Thoughts: No   Sensorium  Memory: Immediate Good  Judgment: Poor  Insight: Shallow   Executive Functions  Concentration: Good  Attention Span: Good  Recall: Good  Fund of Knowledge: Good  Language: Good   Psychomotor Activity  Psychomotor Activity: Psychomotor Activity: Normal   Assets  Assets: Communication Skills; Desire for Improvement   Sleep  Sleep: Sleep: Poor  No Safety Checks orders active in  given range  Nutritional Assessment (For OBS and FBC admissions only) Has the patient had a weight loss or gain of 10 pounds or more in the last 3 months?: No Has the patient had a decrease in food intake/or appetite?: No Does the patient have dental problems?: No Does the patient have eating habits or behaviors that may be indicators of an eating disorder including binging or inducing vomiting?: No Has the patient recently lost weight without trying?: 0 Has the patient been eating poorly because of a decreased appetite?: 0 Malnutrition Screening Tool Score: 0    Physical Exam  Physical Exam Constitutional:      General: She is not in acute distress.    Appearance: Normal appearance. She is not ill-appearing.  HENT:     Head: Normocephalic and atraumatic.     Nose: Nose normal.   Eyes:     Extraocular Movements: Extraocular movements intact.     Conjunctiva/sclera: Conjunctivae normal.     Pupils: Pupils are equal, round, and reactive to light.    Cardiovascular:     Rate and Rhythm: Normal rate and regular rhythm.  Pulmonary:     Effort: Pulmonary effort is normal.     Breath sounds: Normal breath sounds.   Musculoskeletal:     Cervical back: Normal range of motion and neck supple.   Skin:    General: Skin is warm and dry.   Neurological:     General: No focal deficit present.     Mental Status: She is alert and oriented to person, place, and time.     Review of Systems  Psychiatric/Behavioral:  Positive for depression and suicidal ideas. The patient is nervous/anxious.     Blood pressure (!) 107/47, pulse (!) 108, temperature 98.4 F (36.9 C), temperature source Oral, resp. rate 16, SpO2 100%. There is no height or weight on file to calculate BMI.  Demographic Factors:  Divorced or widowed, Caucasian, and Low socioeconomic status  Loss Factors: Decrease in vocational status and Loss of significant relationship  Historical Factors: Prior suicide attempts  and Impulsivity  Risk Reduction Factors:   NA  Continued Clinical Symptoms:  More than one psychiatric diagnosis  Cognitive Features That Contribute To Risk:  Thought constriction (tunnel vision)    Suicide Risk:  Extreme:  Frequent, intense, and enduring suicidal ideation, specific plans, clear subjective and objective intent, impaired self-control, severe dysphoria/symptomatology, many risk factors and no protective factors.  Plan Of Care/Follow-up recommendations:  Other:  Patient accepted to [facility] inpatient psychiatric treatment facility and will be transported via psych transport.  Continue current home  medications as prescribed.  No changes or recommendations pertaining to psychiatric treatment implement here during this brief admission.  [facility]position[facility] [place] [facility]  Suzen Lesches, NP 07/31/2023, 10:30 AM
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
## 2023-07-31 NOTE — ED Notes (Signed)
 Report given to Donora, Charity fundraiser. Patient discharged in no acute distress. Pt offered lunch and snack to carry with her and pt declined. Belongings returned from locker #13 and given to [employer] driver.

## 2023-07-31 NOTE — ED Notes (Signed)
 Patient endorses SI. She denies HI or AVH. States I just have a lot going on.
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
## 2023-07-31 NOTE — Progress Notes (Signed)
 LCSW Progress Note  [id number]   [name]  07/31/2023  9:08 AM  Description:   Inpatient Psychiatric Referral  Patient was recommended inpatient per Chinwendu Onuoha NP. There are no available beds at [facility], per [facility] Cherylynn Ernst RN. Patient was referred to the following out of network facilities:   [facility] Provider Address Phone Fax  [facility]  [address], [place] [address] [phone]  [facility]  [address] [place] [address] [phone]  [facility]  [address] [place], [place] [address] [phone]  [facility]  [address] [place] [address] [phone]  [facility]  [address], [place] [address] [phone]  [facility]  [address] [place] [address] [phone]  [facility] EFAX  [address] [place], [place] [phone]  [facility]  [address] [place] [address] [phone]  [facility]patient [place]  [place] [phone]  [facility]  [address], [place] [address] [phone]      Situation ongoing, CSW to continue following and update chart as more information becomes available.     [place] Ariannie Penaloza, MSW, LCSW  07/31/2023 9:08 AM

## 2023-07-31 NOTE — ED Notes (Signed)
 [facility] called to transport pt to [facility]

## 2023-07-31 NOTE — Progress Notes (Signed)
 Pt has been accepted to [facility] on 07/31/2023  Bed assignment: [facility] / TBD  Pt meets inpatient criteria per: Thurman Ivans NP  Attending Physician will be: Elspeth Gory MD   Report can be called to: [phone]  Pt can arrive after NEG COVID/ Please do not call report until transport has arrived.   Care Team Notified: Suzen Lesches NP, Suzen Cart RN  [place] Earland Reish LCSW-A   07/31/2023 9:54 AM

## 2023-07-31 NOTE — ED Provider Notes (Signed)
 [facility] Continuous Assessment Admission H&P  Date: 07/31/23 Patient Name: [name] MRN: [id number] Chief Complaint:  I'm not feeling good tonight.  Diagnoses:  Final diagnoses:  Borderline personality disorder (HCC)  Passive suicidal ideations  Schizoaffective disorder, depressive type (HCC)    HPI: [name] is a 33 year old female with multiple psychiatric histories,(PTSD, bipolar disorder, multiple suicide attempts, borderline personality disorder, depressed with psychotic features, and GAD, who presented voluntarily as a walk-in to [facility] by [employer], requesting someone to talk to about her traumas.  Patient was seen face-to-face by this provider and chart reviewed.  Per chart review, this is the patient's 23rd ED/urgent care visit within 6 months.  She is well-known to the behavioral health service line and area emergency departments with frequent complaints of SI or suicide attempts.  Patient has had 2 inpatient psychiatric hospitalizations within the same period, and was just discharged from [facility] yesterday after a 12-day admission.  Patient reports so, [facility] was where I just got out of, I asked to stay for more days because I was going through some stuff, they didn't listen and discharged to me, but I need more time, I I need to go somewhere else, I just want to go to [employer]. Patient reports they did not help me because I was trying to get on the Adderall because I have ADHD, but they will not give it to me, I am not feeling good tonight and I have been going through a lot of trauma for the past 3 months to the point where am feeling suicidal.  She denies having any plans yet.   Patient reports she is currently prescribed Risperdal  3 mg twice daily, Zoloft  50 mg daily, metoprolol  25 mg twice daily, prazosin  3 mg nightly, melatonin 3 mg nightly and Protonix  40 mg daily.  On evaluation, patient is alert, oriented x 3, and  cooperative. Speech is clear and coherent. Pt appears casually dressed. Eye contact is fair. Mood is anxious, affect is congruent with mood. Thought process is coherent and goal directed and thought content is WDL. Pt endorses passive SI, denies HI/AVH. There is no objective indication that the patient is responding to internal stimuli. No delusions elicited during this assessment.    Discussed recommendation for inpatient psychiatric admission for stabilization and treatment.  Discussed inpatient milieu and expectations.  Patient is provided with opportunity for questions.  She verbalized understanding and is in agreement.  Total Time spent with patient: 20 minutes  Musculoskeletal  Strength & Muscle Tone: within normal limits Gait & Station: normal Patient leans: N/A  Psychiatric Specialty Exam  Presentation General Appearance:  Casual  Eye Contact: Fair  Speech: Clear and Coherent  Speech Volume: Normal  Handedness: Right   Mood and Affect  Mood: Anxious  Affect: Congruent   Thought Process  Thought Processes: Coherent; Goal Directed  Descriptions of Associations:Intact  Orientation:Partial  Thought Content:WDL  Diagnosis of Schizophrenia or Schizoaffective disorder in past: No  Duration of Psychotic Symptoms: Greater than six months  Hallucinations:Hallucinations: None  Ideas of Reference:None  Suicidal Thoughts:Suicidal Thoughts: Yes, Passive SI Passive Intent and/or Plan: Without Plan; With Means to Carry Out  Homicidal Thoughts:Homicidal Thoughts: No   Sensorium  Memory: Immediate Good  Judgment: Poor  Insight: Shallow   Executive Functions  Concentration: Good  Attention Span: Good  Recall: Good  Fund of Knowledge: Good  Language: Good   Psychomotor Activity  Psychomotor Activity: Psychomotor Activity: Normal  Assets  Assets: Manufacturing systems engineer; Desire for Improvement   Sleep  Sleep: Sleep:  Poor   Nutritional Assessment (For OBS and FBC admissions only) Has the patient had a weight loss or gain of 10 pounds or more in the last 3 months?: No Has the patient had a decrease in food intake/or appetite?: No Does the patient have dental problems?: No Does the patient have eating habits or behaviors that may be indicators of an eating disorder including binging or inducing vomiting?: No Has the patient recently lost weight without trying?: 0 Has the patient been eating poorly because of a decreased appetite?: 0 Malnutrition Screening Tool Score: 0    Physical Exam Constitutional:      General: She is not in acute distress.    Appearance: She is not diaphoretic.  HENT:     Nose: No congestion.   Cardiovascular:     Rate and Rhythm: Normal rate.  Pulmonary:     Effort: No respiratory distress.  Chest:     Chest wall: No tenderness.   Neurological:     Mental Status: She is alert and oriented to person, place, and time.   Psychiatric:        Attention and Perception: Attention and perception normal.        Mood and Affect: Mood is anxious. Affect is blunt.        Speech: Speech normal.        Behavior: Behavior is cooperative.        Thought Content: Thought content includes suicidal ideation.        Judgment: Judgment is impulsive.    Review of Systems  Constitutional:  Negative for chills, diaphoresis and fever.  HENT:  Negative for congestion.   Eyes:  Negative for discharge.  Respiratory:  Negative for cough, shortness of breath and wheezing.   Cardiovascular:  Negative for chest pain and palpitations.  Gastrointestinal:  Negative for diarrhea, nausea and vomiting.  Neurological:  Negative for dizziness, seizures, loss of consciousness and headaches.  Psychiatric/Behavioral:  Positive for suicidal ideas. The patient is nervous/anxious.     Blood pressure 101/66, pulse 96, temperature 98.1 F (36.7 C), temperature source Oral, resp. rate 20, SpO2 98%. There  is no height or weight on file to calculate BMI.  Past Psychiatric History: See H & P   Is the patient at risk to self? Yes  Has the patient been a risk to self in the past 6 months? Yes .    Has the patient been a risk to self within the distant past? Yes   Is the patient a risk to others? No   Has the patient been a risk to others in the past 6 months? No   Has the patient been a risk to others within the distant past? No   Past Medical History: See Chart  Family History: N/A  Social History: N/A  Last Labs:  Admission on 07/31/2023  Component Date Value Ref Range Status   Preg Test, Ur 07/31/2023 Negative  Negative Final   POC Amphetamine UR 07/31/2023 None Detected  NONE DETECTED (Cut Off Level 1000 ng/mL) Final   POC Secobarbital (BAR) 07/31/2023 None Detected  NONE DETECTED (Cut Off Level 300 ng/mL) Final   POC Buprenorphine (BUP) 07/31/2023 None Detected  NONE DETECTED (Cut Off Level 10 ng/mL) Final   POC Oxazepam (BZO) 07/31/2023 None Detected  NONE DETECTED (Cut Off Level 300 ng/mL) Final   POC Cocaine UR 07/31/2023 None Detected  NONE  DETECTED (Cut Off Level 300 ng/mL) Final   POC Methamphetamine UR 07/31/2023 None Detected  NONE DETECTED (Cut Off Level 1000 ng/mL) Final   POC Morphine  07/31/2023 None Detected  NONE DETECTED (Cut Off Level 300 ng/mL) Final   POC Methadone UR 07/31/2023 None Detected  NONE DETECTED (Cut Off Level 300 ng/mL) Final   POC Oxycodone  UR 07/31/2023 None Detected  NONE DETECTED (Cut Off Level 100 ng/mL) Final   POC Marijuana UR 07/31/2023 None Detected  NONE DETECTED (Cut Off Level 50 ng/mL) Final  Admission on 07/13/2023, Discharged on 07/17/2023  Component Date Value Ref Range Status   Sodium 07/13/2023 141  135 - 145 mmol/L Final   Potassium 07/13/2023 3.6  3.5 - 5.1 mmol/L Final   Chloride 07/13/2023 109  98 - 111 mmol/L Final   CO2 07/13/2023 22  22 - 32 mmol/L Final   Glucose, Bld 07/13/2023 110 (H)  70 - 99 mg/dL Final   Glucose  reference range applies only to samples taken after fasting for at least 8 hours.   BUN 07/13/2023 <5 (L)  6 - 20 mg/dL Final   Creatinine, Ser 07/13/2023 0.68  0.44 - 1.00 mg/dL Final   Calcium  07/13/2023 9.1  8.9 - 10.3 mg/dL Final   Total Protein 93/92/7974 6.9  6.5 - 8.1 g/dL Final   Albumin 93/92/7974 3.7  3.5 - 5.0 g/dL Final   AST 93/92/7974 20  15 - 41 U/L Final   ALT 07/13/2023 18  0 - 44 U/L Final   Alkaline Phosphatase 07/13/2023 65  38 - 126 U/L Final   Total Bilirubin 07/13/2023 0.3  0.0 - 1.2 mg/dL Final   GFR, Estimated 07/13/2023 >60  >60 mL/min Final   Comment: (NOTE) Calculated using the CKD-EPI Creatinine Equation (2021)    Anion gap 07/13/2023 10  5 - 15 Final   Performed at [facility], [address] [place], [place] [address]   Alcohol , Ethyl (B) 07/13/2023 <15  <15 mg/dL Final   Comment: (NOTE) For medical purposes only. Performed at [facility], [address] [place], [place] [address]    Salicylate Lvl 07/13/2023 <7.0 (L)  7.0 - 30.0 mg/dL Final   Performed at [facility], [address] [place], [place] [address]   WBC 07/13/2023 7.6  4.0 - 10.5 K/uL Final   RBC 07/13/2023 4.57  3.87 - 5.11 MIL/uL Final   Hemoglobin 07/13/2023 11.5 (L)  12.0 - 15.0 g/dL Final   HCT 93/92/7974 37.2  36.0 - 46.0 % Final   MCV 07/13/2023 81.4  80.0 - 100.0 fL Final   MCH 07/13/2023 25.2 (L)  26.0 - 34.0 pg Final   MCHC 07/13/2023 30.9  30.0 - 36.0 g/dL Final   RDW 93/92/7974 14.8  11.5 - 15.5 % Final   Platelets 07/13/2023 263  150 - 400 K/uL Final   nRBC 07/13/2023 0.0  0.0 - 0.2 % Final   Neutrophils Relative % 07/13/2023 59  % Final   Neutro Abs 07/13/2023 4.5  1.7 - 7.7 K/uL Final   Lymphocytes Relative 07/13/2023 33  % Final   Lymphs Abs 07/13/2023 2.5  0.7 - 4.0 K/uL Final   Monocytes Relative 07/13/2023 6  % Final   Monocytes Absolute 07/13/2023 0.5  0.1 - 1.0 K/uL Final   Eosinophils Relative 07/13/2023 1  % Final   Eosinophils Absolute  07/13/2023 0.1  0.0 - 0.5 K/uL Final   Basophils Relative 07/13/2023 1  % Final  Basophils Absolute 07/13/2023 0.1  0.0 - 0.1 K/uL Final   Immature Granulocytes 07/13/2023 0  % Final   Abs Immature Granulocytes 07/13/2023 0.03  0.00 - 0.07 K/uL Final   Performed at [facility], [address] [place], [place] [address]   Opiates 07/13/2023 NONE DETECTED  NONE DETECTED Final   Cocaine 07/13/2023 NONE DETECTED  NONE DETECTED Final   Benzodiazepines 07/13/2023 NONE DETECTED  NONE DETECTED Final   Amphetamines 07/13/2023 NONE DETECTED  NONE DETECTED Final   Tetrahydrocannabinol 07/13/2023 NONE DETECTED  NONE DETECTED Final   Barbiturates 07/13/2023 NONE DETECTED  NONE DETECTED Final   Comment: (NOTE) DRUG SCREEN FOR MEDICAL PURPOSES ONLY.  IF CONFIRMATION IS NEEDED FOR ANY PURPOSE, NOTIFY LAB WITHIN 5 DAYS.  LOWEST DETECTABLE LIMITS FOR URINE DRUG SCREEN Drug Class                     Cutoff (ng/mL) Amphetamine and metabolites    1000 Barbiturate and metabolites    200 Benzodiazepine                 200 Opiates and metabolites        300 Cocaine and metabolites        300 THC                            50 Performed at [facility], [address] [place], [place] [address]    Preg, Serum 07/13/2023 NEGATIVE  NEGATIVE Final   Comment:        THE SENSITIVITY OF THIS METHODOLOGY IS >10 mIU/mL. Performed at [facility], [address], [place], [place] [address]    Color, Urine 07/13/2023 YELLOW  YELLOW Final   APPearance 07/13/2023 HAZY (A)  CLEAR Final   Specific Gravity, Urine 07/13/2023 1.016  1.005 - 1.030 Final   pH 07/13/2023 5.0  5.0 - 8.0 Final   Glucose, UA 07/13/2023 NEGATIVE  NEGATIVE mg/dL Final   Hgb urine dipstick 07/13/2023 NEGATIVE  NEGATIVE Final   Bilirubin Urine 07/13/2023 NEGATIVE  NEGATIVE Final   Ketones, ur 07/13/2023 NEGATIVE  NEGATIVE mg/dL Final   Protein, ur 93/92/7974 NEGATIVE  NEGATIVE mg/dL Final   Nitrite 93/92/7974 NEGATIVE   NEGATIVE Final   Leukocytes,Ua 07/13/2023 NEGATIVE  NEGATIVE Final   Performed at [facility], [address], [place], [place] [address]   Acetaminophen  (Tylenol ), Serum 07/13/2023 <10 (L)  10 - 30 ug/mL Final   Comment: (NOTE) Therapeutic concentrations vary significantly. A range of 10-30 ug/mL  may be an effective concentration for many patients. However, some  are best treated at concentrations outside of this range. Acetaminophen  concentrations >150 ug/mL at 4 hours after ingestion  and >50 ug/mL at 12 hours after ingestion are often associated with  toxic reactions.  Performed at [facility], [address], [place], [place] [address]    Acetaminophen  (Tylenol ), Serum 07/13/2023 <10 (L)  10 - 30 ug/mL Final   Comment: (NOTE) Therapeutic concentrations vary significantly. A range of 10-30 ug/mL  may be an effective concentration for many patients. However, some  are best treated at concentrations outside of this range. Acetaminophen  concentrations >150 ug/mL at 4 hours after ingestion  and >50 ug/mL at 12 hours after ingestion are often associated with  toxic reactions.  Performed at [facility], [address], [place], [place] [address]    Magnesium   07/13/2023 1.5 (L)  1.7 - 2.4 mg/dL Final   Performed at [facility], [address] [place], [place] [address]   Lactic Acid, Venous 07/13/2023 2.1 (HH)  0.5 - 1.9 mmol/L Final   Comment: CRITICAL RESULT CALLED TO, READ BACK BY AND VERIFIED WITH WEBER MORRIS R RN @1348  07/13/23 GORMAN ANTES Performed at [facility], [address], [place], [place] [address]    Sodium 07/14/2023 137  135 - 145 mmol/L Final   Potassium 07/14/2023 3.5  3.5 - 5.1 mmol/L Final   Chloride 07/14/2023 108  98 - 111 mmol/L Final   CO2 07/14/2023 23  22 - 32 mmol/L Final   Glucose, Bld 07/14/2023 92  70 - 99 mg/dL Final   Glucose reference range applies only to samples taken after fasting for at least 8 hours.   BUN  07/14/2023 5 (L)  6 - 20 mg/dL Final   Creatinine, Ser 07/14/2023 0.70  0.44 - 1.00 mg/dL Final   Calcium  07/14/2023 8.2 (L)  8.9 - 10.3 mg/dL Final   Total Protein 93/91/7974 5.9 (L)  6.5 - 8.1 g/dL Final   Albumin 93/91/7974 3.1 (L)  3.5 - 5.0 g/dL Final   AST 93/91/7974 19  15 - 41 U/L Final   ALT 07/14/2023 18  0 - 44 U/L Final   Alkaline Phosphatase 07/14/2023 55  38 - 126 U/L Final   Total Bilirubin 07/14/2023 0.4  0.0 - 1.2 mg/dL Final   GFR, Estimated 07/14/2023 >60  >60 mL/min Final   Comment: (NOTE) Calculated using the CKD-EPI Creatinine Equation (2021)    Anion gap 07/14/2023 6  5 - 15 Final   Performed at [facility], [address], [place], [place] [address]   WBC 07/14/2023 6.9  4.0 - 10.5 K/uL Final   RBC 07/14/2023 4.14  3.87 - 5.11 MIL/uL Final   Hemoglobin 07/14/2023 10.7 (L)  12.0 - 15.0 g/dL Final   HCT 93/91/7974 33.6 (L)  36.0 - 46.0 % Final   MCV 07/14/2023 81.2  80.0 - 100.0 fL Final   MCH 07/14/2023 25.8 (L)  26.0 - 34.0 pg Final   MCHC 07/14/2023 31.8  30.0 - 36.0 g/dL Final   RDW 93/91/7974 14.6  11.5 - 15.5 % Final   Platelets 07/14/2023 253  150 - 400 K/uL Final   nRBC 07/14/2023 0.0  0.0 - 0.2 % Final   Performed at [facility], [address] [place], [place] [address]   Magnesium  07/14/2023 1.9  1.7 - 2.4 mg/dL Final   Performed at [facility], [address], [place], [place] [address]  Admission on 06/18/2023, Discharged on 06/18/2023  Component Date Value Ref Range Status   WBC 06/18/2023 8.5  4.0 - 10.5 K/uL Final   RBC 06/18/2023 4.31  3.87 - 5.11 MIL/uL Final   Hemoglobin 06/18/2023 10.8 (L)  12.0 - 15.0 g/dL Final   HCT 94/86/7974 35.1 (L)  36.0 - 46.0 % Final   MCV 06/18/2023 81.4  80.0 - 100.0 fL Final   MCH 06/18/2023 25.1 (L)  26.0 - 34.0 pg Final   MCHC 06/18/2023 30.8  30.0 - 36.0 g/dL Final   RDW 94/86/7974 15.0  11.5 - 15.5 % Final   Platelets 06/18/2023 255  150 - 400 K/uL Final   nRBC 06/18/2023 0.0  0.0 -  0.2 % Final   Performed at [facility], [address], [place], [place] [address]  Sodium 06/18/2023 138  135 - 145 mmol/L Final   Potassium 06/18/2023 3.8  3.5 - 5.1 mmol/L Final   Chloride 06/18/2023 106  98 - 111 mmol/L Final   CO2 06/18/2023 24  22 - 32 mmol/L Final   Glucose, Bld 06/18/2023 90  70 - 99 mg/dL Final   Glucose reference range applies only to samples taken after fasting for at least 8 hours.   BUN 06/18/2023 <5 (L)  6 - 20 mg/dL Final   Creatinine, Ser 06/18/2023 0.66  0.44 - 1.00 mg/dL Final   Calcium  06/18/2023 8.7 (L)  8.9 - 10.3 mg/dL Final   Total Protein 94/86/7974 6.4 (L)  6.5 - 8.1 g/dL Final   Albumin 94/86/7974 3.2 (L)  3.5 - 5.0 g/dL Final   AST 94/86/7974 20  15 - 41 U/L Final   ALT 06/18/2023 20  0 - 44 U/L Final   Alkaline Phosphatase 06/18/2023 67  38 - 126 U/L Final   Total Bilirubin 06/18/2023 0.5  0.0 - 1.2 mg/dL Final   GFR, Estimated 06/18/2023 >60  >60 mL/min Final   Comment: (NOTE) Calculated using the CKD-EPI Creatinine Equation (2021)    Anion gap 06/18/2023 8  5 - 15 Final   Performed at [facility], [address], [place], [place] [address]   Color, Urine 06/18/2023 YELLOW  YELLOW Final   APPearance 06/18/2023 HAZY (A)  CLEAR Final   Specific Gravity, Urine 06/18/2023 1.017  1.005 - 1.030 Final   pH 06/18/2023 6.0  5.0 - 8.0 Final   Glucose, UA 06/18/2023 NEGATIVE  NEGATIVE mg/dL Final   Hgb urine dipstick 06/18/2023 NEGATIVE  NEGATIVE Final   Bilirubin Urine 06/18/2023 NEGATIVE  NEGATIVE Final   Ketones, ur 06/18/2023 NEGATIVE  NEGATIVE mg/dL Final   Protein, ur 94/86/7974 NEGATIVE  NEGATIVE mg/dL Final   Nitrite 94/86/7974 NEGATIVE  NEGATIVE Final   Leukocytes,Ua 06/18/2023 TRACE (A)  NEGATIVE Final   RBC / HPF 06/18/2023 0-5  0 - 5 RBC/hpf Final   WBC, UA 06/18/2023 11-20  0 - 5 WBC/hpf Final   Bacteria, UA 06/18/2023 RARE (A)  NONE SEEN Final   Squamous Epithelial / HPF 06/18/2023 6-10  0 -  5 /HPF Final   Mucus 06/18/2023 PRESENT   Final   Performed at [facility], [address], [place], [place] [address]   Opiates 06/18/2023 NONE DETECTED  NONE DETECTED Final   Cocaine 06/18/2023 NONE DETECTED  NONE DETECTED Final   Benzodiazepines 06/18/2023 NONE DETECTED  NONE DETECTED Final   Amphetamines 06/18/2023 NONE DETECTED  NONE DETECTED Final   Tetrahydrocannabinol 06/18/2023 NONE DETECTED  NONE DETECTED Final   Barbiturates 06/18/2023 NONE DETECTED  NONE DETECTED Final   Comment: (NOTE) DRUG SCREEN FOR MEDICAL PURPOSES ONLY.  IF CONFIRMATION IS NEEDED FOR ANY PURPOSE, NOTIFY LAB WITHIN 5 DAYS.  LOWEST DETECTABLE LIMITS FOR URINE DRUG SCREEN Drug Class                     Cutoff (ng/mL) Amphetamine and metabolites    1000 Barbiturate and metabolites    200 Benzodiazepine                 200 Opiates and metabolites        300 Cocaine and metabolites        300 THC  50 Performed at [facility], [address], [place], [place] [address]    Preg Test, Ur 06/18/2023 NEGATIVE  NEGATIVE Final   Comment:        THE SENSITIVITY OF THIS METHODOLOGY IS >25 mIU/mL. Performed at [facility], [address] [place], [place] [address]    Troponin I (High Sensitivity) 06/18/2023 <2  <18 ng/L Final   Comment: (NOTE) Elevated high sensitivity troponin I (hsTnI) values and significant  changes across serial measurements may suggest ACS but many other  chronic and acute conditions are known to elevate hsTnI results.  Refer to the Links section for chest pain algorithms and additional  guidance. Performed at [facility], [address], [place], [place] [address]    Acetaminophen  (Tylenol ), Serum 06/18/2023 <10 (L)  10 - 30 ug/mL Final   Comment: (NOTE) Therapeutic concentrations vary significantly. A range of 10-30 ug/mL  may be an effective concentration for many  patients. However, some  are best treated at concentrations outside of this range. Acetaminophen  concentrations >150 ug/mL at 4 hours after ingestion  and >50 ug/mL at 12 hours after ingestion are often associated with  toxic reactions.  Performed at [facility], [address], [place], [place] [address]    Salicylate Lvl 06/18/2023 <7.0 (L)  7.0 - 30.0 mg/dL Final   Performed at [facility], [address] [place], [place] [address]   D-Dimer, Earleen 06/18/2023 <0.27  0.00 - 0.50 ug/mL-FEU Final   Comment: (NOTE) At the manufacturer cut-off value of 0.5 g/mL FEU, this assay has a negative predictive value of 95-100%.This assay is intended for use in conjunction with a clinical pretest probability (PTP) assessment model to exclude pulmonary embolism (PE) and deep venous thrombosis (DVT) in outpatients suspected of PE or DVT. Results should be correlated with clinical presentation. Performed at [facility], [address] [place], [place] [address]   Admission on 06/17/2023, Discharged on 06/17/2023  Component Date Value Ref Range Status   Sodium 06/17/2023 138  135 - 145 mmol/L Final   Potassium 06/17/2023 3.6  3.5 - 5.1 mmol/L Final   Chloride 06/17/2023 102  98 - 111 mmol/L Final   CO2 06/17/2023 27  22 - 32 mmol/L Final   Glucose, Bld 06/17/2023 112 (H)  70 - 99 mg/dL Final   Glucose reference range applies only to samples taken after fasting for at least 8 hours.   BUN 06/17/2023 5 (L)  6 - 20 mg/dL Final   Creatinine, Ser 06/17/2023 0.77  0.44 - 1.00 mg/dL Final   Calcium  06/17/2023 9.1  8.9 - 10.3 mg/dL Final   Total Protein 94/87/7974 7.0  6.5 - 8.1 g/dL Final   Albumin 94/87/7974 3.7  3.5 - 5.0 g/dL Final   AST 94/87/7974 21  15 - 41 U/L Final   ALT 06/17/2023 22  0 - 44 U/L Final   Alkaline Phosphatase 06/17/2023 68  38 - 126 U/L Final   Total Bilirubin 06/17/2023 0.5  0.0 - 1.2 mg/dL Final   GFR, Estimated  06/17/2023 >60  >60 mL/min Final   Comment: (NOTE) Calculated using the CKD-EPI Creatinine Equation (2021)    Anion gap 06/17/2023 9  5 - 15 Final   Performed at [facility], [address] [place], [place] [address]   Alcohol , Ethyl (B) 06/17/2023 <15  <15 mg/dL Final   Comment: Please note change in reference range. (NOTE) For medical  purposes only. Performed at [facility], [address] [place], [place] [address]    WBC 06/17/2023 9.6  4.0 - 10.5 K/uL Final   RBC 06/17/2023 4.66  3.87 - 5.11 MIL/uL Final   Hemoglobin 06/17/2023 11.6 (L)  12.0 - 15.0 g/dL Final   HCT 94/87/7974 37.4  36.0 - 46.0 % Final   MCV 06/17/2023 80.3  80.0 - 100.0 fL Final   MCH 06/17/2023 24.9 (L)  26.0 - 34.0 pg Final   MCHC 06/17/2023 31.0  30.0 - 36.0 g/dL Final   RDW 94/87/7974 14.9  11.5 - 15.5 % Final   Platelets 06/17/2023 275  150 - 400 K/uL Final   nRBC 06/17/2023 0.0  0.0 - 0.2 % Final   Performed at [facility], [address], [place], [place] [address]   Opiates 06/17/2023 NONE DETECTED  NONE DETECTED Final   Cocaine 06/17/2023 NONE DETECTED  NONE DETECTED Final   Benzodiazepines 06/17/2023 NONE DETECTED  NONE DETECTED Final   Amphetamines 06/17/2023 NONE DETECTED  NONE DETECTED Final   Tetrahydrocannabinol 06/17/2023 NONE DETECTED  NONE DETECTED Final   Barbiturates 06/17/2023 NONE DETECTED  NONE DETECTED Final   Comment: (NOTE) DRUG SCREEN FOR MEDICAL PURPOSES ONLY.  IF CONFIRMATION IS NEEDED FOR ANY PURPOSE, NOTIFY LAB WITHIN 5 DAYS.  LOWEST DETECTABLE LIMITS FOR URINE DRUG SCREEN Drug Class                     Cutoff (ng/mL) Amphetamine and metabolites    1000 Barbiturate and metabolites    200 Benzodiazepine                 200 Opiates and metabolites        300 Cocaine and metabolites        300 THC                            50 Performed at [facility], [address], [place], [place] [address]    Preg, Serum 06/17/2023 NEGATIVE  NEGATIVE  Final   Comment:        THE SENSITIVITY OF THIS METHODOLOGY IS >10 mIU/mL. Performed at [facility], [address], [place], [place] [address]    Troponin I (High Sensitivity) 06/17/2023 5  <18 ng/L Final   Comment: (NOTE) Elevated high sensitivity troponin I (hsTnI) values and significant  changes across serial measurements may suggest ACS but many other  chronic and acute conditions are known to elevate hsTnI results.  Refer to the Links section for chest pain algorithms and additional  guidance. Performed at [facility], [address], [place], [place] [address]    Troponin I (High Sensitivity) 06/17/2023 3  <18 ng/L Final   Comment: (NOTE) Elevated high sensitivity troponin I (hsTnI) values and significant  changes across serial measurements may suggest ACS but many other  chronic and acute conditions are known to elevate hsTnI results.  Refer to the Links section for chest pain algorithms and additional  guidance. Performed at [facility], [address], [place], [place] [address]   Admission on 05/07/2023, Discharged on 05/07/2023  Component Date Value Ref Range Status   WBC 05/07/2023 8.0  4.0 - 10.5 K/uL Final   RBC 05/07/2023 4.53  3.87 - 5.11 MIL/uL Final   Hemoglobin 05/07/2023 11.3 (L)  12.0 - 15.0 g/dL Final   HCT 95/98/7974 36.5  36.0 - 46.0 % Final   MCV 05/07/2023 80.6  80.0 - 100.0 fL Final   MCH 05/07/2023 24.9 (L)  26.0 - 34.0 pg Final   MCHC 05/07/2023 31.0  30.0 - 36.0 g/dL Final   RDW 95/98/7974 14.9  11.5 - 15.5 % Final   Platelets 05/07/2023 288  150 - 400 K/uL Final   nRBC 05/07/2023 0.0  0.0 - 0.2 % Final   Performed at [facility], [address] [place], [place] [address]   Sodium 05/07/2023 136  135 - 145 mmol/L Final   Potassium 05/07/2023 3.9  3.5 - 5.1 mmol/L Final   Chloride 05/07/2023 107  98 - 111 mmol/L Final   CO2 05/07/2023 20 (L)  22 - 32 mmol/L Final   Glucose, Bld 05/07/2023 98  70 -  99 mg/dL Final   Glucose reference range applies only to samples taken after fasting for at least 8 hours.   BUN 05/07/2023 9  6 - 20 mg/dL Final   Creatinine, Ser 05/07/2023 0.74  0.44 - 1.00 mg/dL Final   Calcium  05/07/2023 9.0  8.9 - 10.3 mg/dL Final   GFR, Estimated 05/07/2023 >60  >60 mL/min Final   Comment: (NOTE) Calculated using the CKD-EPI Creatinine Equation (2021)    Anion gap 05/07/2023 9  5 - 15 Final   Performed at [facility], [address], [place], [place] [address]   Troponin I (High Sensitivity) 05/07/2023 <2  <18 ng/L Final   Comment: (NOTE) Elevated high sensitivity troponin I (hsTnI) values and significant  changes across serial measurements may suggest ACS but many other  chronic and acute conditions are known to elevate hsTnI results.  Refer to the Links section for chest pain algorithms and additional  guidance. Performed at [facility], [address], [place], [place] [address]   Admission on 04/30/2023, Discharged on 05/01/2023  Component Date Value Ref Range Status   Alcohol , Ethyl (B) 04/30/2023 <10  <10 mg/dL Final   Comment: (NOTE) Lowest detectable limit for serum alcohol  is 10 mg/dL.  For medical purposes only. Performed at [facility], [address] [place], [place] [address]    Opiates 04/30/2023 NONE DETECTED  NONE DETECTED Final   Cocaine 04/30/2023 NONE DETECTED  NONE DETECTED Final   Benzodiazepines 04/30/2023 NONE DETECTED  NONE DETECTED Final   Amphetamines 04/30/2023 NONE DETECTED  NONE DETECTED Final   Tetrahydrocannabinol 04/30/2023 NONE DETECTED  NONE DETECTED Final   Barbiturates 04/30/2023 NONE DETECTED  NONE DETECTED Final   Comment: (NOTE) DRUG SCREEN FOR MEDICAL PURPOSES ONLY.  IF CONFIRMATION IS NEEDED FOR ANY PURPOSE, NOTIFY LAB WITHIN 5 DAYS.  LOWEST DETECTABLE LIMITS FOR URINE DRUG SCREEN Drug Class                     Cutoff (ng/mL) Amphetamine and  metabolites    1000 Barbiturate and metabolites    200 Benzodiazepine                 200 Opiates and metabolites        300 Cocaine and metabolites        300 THC                            50 Performed at [facility], [address], [place], [place] [address]    WBC 04/30/2023 7.4  4.0 - 10.5 K/uL Final  RBC 04/30/2023 4.41  3.87 - 5.11 MIL/uL Final   Hemoglobin 04/30/2023 10.9 (L)  12.0 - 15.0 g/dL Final   HCT 96/74/7974 35.9 (L)  36.0 - 46.0 % Final   MCV 04/30/2023 81.4  80.0 - 100.0 fL Final   MCH 04/30/2023 24.7 (L)  26.0 - 34.0 pg Final   MCHC 04/30/2023 30.4  30.0 - 36.0 g/dL Final   RDW 96/74/7974 15.5  11.5 - 15.5 % Final   Platelets 04/30/2023 267  150 - 400 K/uL Final   nRBC 04/30/2023 0.0  0.0 - 0.2 % Final   Neutrophils Relative % 04/30/2023 64  % Final   Neutro Abs 04/30/2023 4.7  1.7 - 7.7 K/uL Final   Lymphocytes Relative 04/30/2023 27  % Final   Lymphs Abs 04/30/2023 2.0  0.7 - 4.0 K/uL Final   Monocytes Relative 04/30/2023 8  % Final   Monocytes Absolute 04/30/2023 0.6  0.1 - 1.0 K/uL Final   Eosinophils Relative 04/30/2023 1  % Final   Eosinophils Absolute 04/30/2023 0.1  0.0 - 0.5 K/uL Final   Basophils Relative 04/30/2023 0  % Final   Basophils Absolute 04/30/2023 0.0  0.0 - 0.1 K/uL Final   Immature Granulocytes 04/30/2023 0  % Final   Abs Immature Granulocytes 04/30/2023 0.02  0.00 - 0.07 K/uL Final   Performed at [facility], [address], [place], [place] [address]   Sodium 04/30/2023 137  135 - 145 mmol/L Final   Potassium 04/30/2023 3.6  3.5 - 5.1 mmol/L Final   Chloride 04/30/2023 107  98 - 111 mmol/L Final   CO2 04/30/2023 23  22 - 32 mmol/L Final   Glucose, Bld 04/30/2023 107 (H)  70 - 99 mg/dL Final   Glucose reference range applies only to samples taken after fasting for at least 8 hours.   BUN 04/30/2023 6  6 - 20 mg/dL Final   Creatinine, Ser 04/30/2023 0.66  0.44 - 1.00 mg/dL Final   Calcium   04/30/2023 8.9  8.9 - 10.3 mg/dL Final   Total Protein 96/74/7974 7.0  6.5 - 8.1 g/dL Final   Albumin 96/74/7974 3.9  3.5 - 5.0 g/dL Final   AST 96/74/7974 14 (L)  15 - 41 U/L Final   ALT 04/30/2023 18  0 - 44 U/L Final   Alkaline Phosphatase 04/30/2023 68  38 - 126 U/L Final   Total Bilirubin 04/30/2023 0.4  0.0 - 1.2 mg/dL Final   GFR, Estimated 04/30/2023 >60  >60 mL/min Final   Comment: (NOTE) Calculated using the CKD-EPI Creatinine Equation (2021)    Anion gap 04/30/2023 7  5 - 15 Final   Performed at [facility], [address] [place], [place] [address]   Preg, Serum 04/30/2023 NEGATIVE  NEGATIVE Final   Comment:        THE SENSITIVITY OF THIS METHODOLOGY IS >10 mIU/mL. Performed at [facility], [address] [place], [place] [address]   Admission on 04/26/2023, Discharged on 04/27/2023  Component Date Value Ref Range Status   Sodium 04/26/2023 137  135 - 145 mmol/L Final   Potassium 04/26/2023 3.6  3.5 - 5.1 mmol/L Final   Chloride 04/26/2023 104  98 - 111 mmol/L Final   CO2 04/26/2023 23  22 - 32 mmol/L Final   Glucose, Bld 04/26/2023 96  70 - 99 mg/dL Final   Glucose reference range applies only to samples taken after fasting for at least 8 hours.  BUN 04/26/2023 8  6 - 20 mg/dL Final   Creatinine, Ser 04/26/2023 0.72  0.44 - 1.00 mg/dL Final   Calcium  04/26/2023 9.2  8.9 - 10.3 mg/dL Final   Total Protein 96/78/7974 7.0  6.5 - 8.1 g/dL Final   Albumin 96/78/7974 4.0  3.5 - 5.0 g/dL Final   AST 96/78/7974 20  15 - 41 U/L Final   ALT 04/26/2023 21  0 - 44 U/L Final   Alkaline Phosphatase 04/26/2023 62  38 - 126 U/L Final   Total Bilirubin 04/26/2023 0.5  0.0 - 1.2 mg/dL Final   GFR, Estimated 04/26/2023 >60  >60 mL/min Final   Comment: (NOTE) Calculated using the CKD-EPI Creatinine Equation (2021)    Anion gap 04/26/2023 10  5 - 15 Final   Performed at [facility], [address] [place], [place] [address]    Alcohol , Ethyl (B) 04/26/2023 <10  <10 mg/dL Final   Comment: (NOTE) Lowest detectable limit for serum alcohol  is 10 mg/dL.  For medical purposes only. Performed at [facility], [address] [place], [place] [address]    Salicylate Lvl 04/26/2023 <7.0 (L)  7.0 - 30.0 mg/dL Final   Performed at [facility], [address], [place], [place] [address]   Acetaminophen  (Tylenol ), Serum 04/26/2023 <10 (L)  10 - 30 ug/mL Final   Comment: (NOTE) Therapeutic concentrations vary significantly. A range of 10-30 ug/mL  may be an effective concentration for many patients. However, some  are best treated at concentrations outside of this range. Acetaminophen  concentrations >150 ug/mL at 4 hours after ingestion  and >50 ug/mL at 12 hours after ingestion are often associated with  toxic reactions.  Performed at [facility], [address], [place], [place] [address]    WBC 04/26/2023 7.4  4.0 - 10.5 K/uL Final   RBC 04/26/2023 4.61  3.87 - 5.11 MIL/uL Final   Hemoglobin 04/26/2023 11.5 (L)  12.0 - 15.0 g/dL Final   HCT 96/78/7974 36.7  36.0 - 46.0 % Final   MCV 04/26/2023 79.6 (L)  80.0 - 100.0 fL Final   MCH 04/26/2023 24.9 (L)  26.0 - 34.0 pg Final   MCHC 04/26/2023 31.3  30.0 - 36.0 g/dL Final   RDW 96/78/7974 15.3  11.5 - 15.5 % Final   Platelets 04/26/2023 277  150 - 400 K/uL Final   nRBC 04/26/2023 0.0  0.0 - 0.2 % Final   Performed at [facility], [address] [place], [place] [address]   Preg, Serum 04/26/2023 NEGATIVE  NEGATIVE Final   Comment:        THE SENSITIVITY OF THIS METHODOLOGY IS >10 mIU/mL. Performed at [facility], [address] [place], [place] [address]   Admission on 04/25/2023, Discharged on 04/26/2023  Component Date Value Ref Range Status   WBC 04/26/2023 9.8  4.0 - 10.5 K/uL Final   RBC 04/26/2023 4.11  3.87 - 5.11 MIL/uL Final   Hemoglobin 04/26/2023 10.3 (L)  12.0 - 15.0 g/dL Final   HCT 96/78/7974 32.6 (L)  36.0 -  46.0 % Final   MCV 04/26/2023 79.3 (L)  80.0 - 100.0 fL Final   MCH 04/26/2023 25.1 (L)  26.0 - 34.0 pg Final   MCHC 04/26/2023 31.6  30.0 - 36.0 g/dL Final   RDW 96/78/7974 15.2  11.5 - 15.5 % Final   Platelets 04/26/2023 238  150 - 400 K/uL Final   nRBC 04/26/2023  0.0  0.0 - 0.2 % Final   Neutrophils Relative % 04/26/2023 66  % Final   Neutro Abs 04/26/2023 6.4  1.7 - 7.7 K/uL Final   Lymphocytes Relative 04/26/2023 24  % Final   Lymphs Abs 04/26/2023 2.4  0.7 - 4.0 K/uL Final   Monocytes Relative 04/26/2023 8  % Final   Monocytes Absolute 04/26/2023 0.8  0.1 - 1.0 K/uL Final   Eosinophils Relative 04/26/2023 1  % Final   Eosinophils Absolute 04/26/2023 0.1  0.0 - 0.5 K/uL Final   Basophils Relative 04/26/2023 0  % Final   Basophils Absolute 04/26/2023 0.0  0.0 - 0.1 K/uL Final   Immature Granulocytes 04/26/2023 1  % Final   Abs Immature Granulocytes 04/26/2023 0.05  0.00 - 0.07 K/uL Final   Performed at [facility], [address], [place], [place] [address]   Sodium 04/26/2023 139  135 - 145 mmol/L Final   Potassium 04/26/2023 3.6  3.5 - 5.1 mmol/L Final   Chloride 04/26/2023 106  98 - 111 mmol/L Final   CO2 04/26/2023 26  22 - 32 mmol/L Final   Glucose, Bld 04/26/2023 103 (H)  70 - 99 mg/dL Final   Glucose reference range applies only to samples taken after fasting for at least 8 hours.   BUN 04/26/2023 9  6 - 20 mg/dL Final   Creatinine, Ser 04/26/2023 0.73  0.44 - 1.00 mg/dL Final   Calcium  04/26/2023 9.1  8.9 - 10.3 mg/dL Final   Total Protein 96/78/7974 6.2 (L)  6.5 - 8.1 g/dL Final   Albumin 96/78/7974 3.5  3.5 - 5.0 g/dL Final   AST 96/78/7974 18  15 - 41 U/L Final   ALT 04/26/2023 19  0 - 44 U/L Final   Alkaline Phosphatase 04/26/2023 58  38 - 126 U/L Final   Total Bilirubin 04/26/2023 0.3  0.0 - 1.2 mg/dL Final   GFR, Estimated 04/26/2023 >60  >60 mL/min Final   Comment: (NOTE) Calculated using the CKD-EPI Creatinine Equation (2021)    Anion gap 04/26/2023 7   5 - 15 Final   Performed at [facility], [address] [place], [place] [address]   Alcohol , Ethyl (B) 04/26/2023 <10  <10 mg/dL Final   Comment: (NOTE) Lowest detectable limit for serum alcohol  is 10 mg/dL.  For medical purposes only. Performed at [facility], [address] [place], [place] [address]    Salicylate Lvl 04/26/2023 <7.0 (L)  7.0 - 30.0 mg/dL Final   Performed at [facility], [address], [place], [place] [address]   Acetaminophen  (Tylenol ), Serum 04/26/2023 <10 (L)  10 - 30 ug/mL Final   Comment: (NOTE) Therapeutic concentrations vary significantly. A range of 10-30 ug/mL  may be an effective concentration for many patients. However, some  are best treated at concentrations outside of this range. Acetaminophen  concentrations >150 ug/mL at 4 hours after ingestion  and >50 ug/mL at 12 hours after ingestion are often associated with  toxic reactions.  Performed at [facility], [address], [place], [place] [address]    Preg, Serum 04/26/2023 NEGATIVE  NEGATIVE Final   Comment:        THE SENSITIVITY OF THIS METHODOLOGY IS >10 mIU/mL. Performed at [facility], [address] [place], [place] [address]   Admission on 04/15/2023, Discharged on 04/17/2023  Component Date Value Ref Range Status   Alcohol , Ethyl (B) 04/15/2023 <10  <10  mg/dL Final   Comment: (NOTE) Lowest detectable limit for serum alcohol  is 10 mg/dL.  For medical purposes only. Performed at [facility], [address], [place], [place] [address]    WBC 04/15/2023 8.3  4.0 - 10.5 K/uL Final   RBC 04/15/2023 4.57  3.87 - 5.11 MIL/uL Final   Hemoglobin 04/15/2023 11.4 (L)  12.0 - 15.0 g/dL Final   HCT 96/89/7974 36.5  36.0 - 46.0 % Final   MCV 04/15/2023 79.9 (L)  80.0 - 100.0 fL Final   MCH 04/15/2023 24.9 (L)  26.0 - 34.0 pg Final   MCHC 04/15/2023 31.2  30.0 - 36.0 g/dL Final   RDW 96/89/7974 15.4  11.5 - 15.5 % Final    Platelets 04/15/2023 246  150 - 400 K/uL Final   nRBC 04/15/2023 0.0  0.0 - 0.2 % Final   Neutrophils Relative % 04/15/2023 72  % Final   Neutro Abs 04/15/2023 6.0  1.7 - 7.7 K/uL Final   Lymphocytes Relative 04/15/2023 20  % Final   Lymphs Abs 04/15/2023 1.6  0.7 - 4.0 K/uL Final   Monocytes Relative 04/15/2023 7  % Final   Monocytes Absolute 04/15/2023 0.6  0.1 - 1.0 K/uL Final   Eosinophils Relative 04/15/2023 1  % Final   Eosinophils Absolute 04/15/2023 0.1  0.0 - 0.5 K/uL Final   Basophils Relative 04/15/2023 0  % Final   Basophils Absolute 04/15/2023 0.0  0.0 - 0.1 K/uL Final   Immature Granulocytes 04/15/2023 0  % Final   Abs Immature Granulocytes 04/15/2023 0.03  0.00 - 0.07 K/uL Final   Performed at [facility], [address] [place], [place] [address]   Sodium 04/15/2023 139  135 - 145 mmol/L Final   Potassium 04/15/2023 3.3 (L)  3.5 - 5.1 mmol/L Final   Chloride 04/15/2023 103  98 - 111 mmol/L Final   CO2 04/15/2023 22  22 - 32 mmol/L Final   Glucose, Bld 04/15/2023 107 (H)  70 - 99 mg/dL Final   Glucose reference range applies only to samples taken after fasting for at least 8 hours.   BUN 04/15/2023 11  6 - 20 mg/dL Final   Creatinine, Ser 04/15/2023 0.65  0.44 - 1.00 mg/dL Final   Calcium  04/15/2023 8.9  8.9 - 10.3 mg/dL Final   GFR, Estimated 04/15/2023 >60  >60 mL/min Final   Comment: (NOTE) Calculated using the CKD-EPI Creatinine Equation (2021)    Anion gap 04/15/2023 14  5 - 15 Final   Performed at [facility], [address], [place], [place] [address]  Admission on 04/14/2023, Discharged on 04/14/2023  Component Date Value Ref Range Status   Troponin I (High Sensitivity) 04/14/2023 <2  <18 ng/L Final   Comment: (NOTE) Elevated high sensitivity troponin I (hsTnI) values and significant  changes across serial measurements may suggest ACS but many other  chronic and acute conditions are known to elevate hsTnI results.   Refer to the Links section for chest pain algorithms and additional  guidance. Performed at [facility], [address], [place], [place] [address]    WBC 04/14/2023 11.8 (H)  4.0 - 10.5 K/uL Final   RBC 04/14/2023 4.97  3.87 - 5.11 MIL/uL Final   Hemoglobin 04/14/2023 12.3  12.0 - 15.0 g/dL Final   HCT 96/90/7974 40.0  36.0 - 46.0 % Final   MCV 04/14/2023 80.5  80.0 - 100.0 fL Final   MCH 04/14/2023 24.7 (L)  26.0 - 34.0 pg Final   MCHC 04/14/2023 30.8  30.0 - 36.0 g/dL Final   RDW 96/90/7974 15.3  11.5 - 15.5 % Final   Platelets 04/14/2023 368  150 - 400 K/uL Final   nRBC 04/14/2023 0.0  0.0 - 0.2 % Final   Neutrophils Relative % 04/14/2023 71  % Final   Neutro Abs 04/14/2023 8.3 (H)  1.7 - 7.7 K/uL Final   Lymphocytes Relative 04/14/2023 22  % Final   Lymphs Abs 04/14/2023 2.5  0.7 - 4.0 K/uL Final   Monocytes Relative 04/14/2023 7  % Final   Monocytes Absolute 04/14/2023 0.8  0.1 - 1.0 K/uL Final   Eosinophils Relative 04/14/2023 0  % Final   Eosinophils Absolute 04/14/2023 0.1  0.0 - 0.5 K/uL Final   Basophils Relative 04/14/2023 0  % Final   Basophils Absolute 04/14/2023 0.0  0.0 - 0.1 K/uL Final   Immature Granulocytes 04/14/2023 0  % Final   Abs Immature Granulocytes 04/14/2023 0.05  0.00 - 0.07 K/uL Final   Performed at [facility], [address], [place], [place] [address]   Sodium 04/14/2023 138  135 - 145 mmol/L Final   Potassium 04/14/2023 3.9  3.5 - 5.1 mmol/L Final   Chloride 04/14/2023 103  98 - 111 mmol/L Final   CO2 04/14/2023 22  22 - 32 mmol/L Final   Glucose, Bld 04/14/2023 99  70 - 99 mg/dL Final   Glucose reference range applies only to samples taken after fasting for at least 8 hours.   BUN 04/14/2023 13  6 - 20 mg/dL Final   Creatinine, Ser 04/14/2023 0.92  0.44 - 1.00 mg/dL Final   Calcium  04/14/2023 9.4  8.9 - 10.3 mg/dL Final   GFR, Estimated 04/14/2023 >60  >60 mL/min Final   Comment: (NOTE) Calculated using the CKD-EPI Creatinine  Equation (2021)    Anion gap 04/14/2023 13  5 - 15 Final   Performed at [facility], [address], [place], [place] [address]   D-Dimer, Quant 04/14/2023 <0.27  0.00 - 0.50 ug/mL-FEU Final   Comment: (NOTE) At the manufacturer cut-off value of 0.5 g/mL FEU, this assay has a negative predictive value of 95-100%.This assay is intended for use in conjunction with a clinical pretest probability (PTP) assessment model to exclude pulmonary embolism (PE) and deep venous thrombosis (DVT) in outpatients suspected of PE or DVT. Results should be correlated with clinical presentation. Performed at [facility], [address], [place], [place] [address]    Troponin I (High Sensitivity) 04/14/2023 <2  <18 ng/L Final   Comment: (NOTE) Elevated high sensitivity troponin I (hsTnI) values and significant  changes across serial measurements may suggest ACS but many other  chronic and acute conditions are known to elevate hsTnI results.  Refer to the Links section for chest pain algorithms and additional  guidance. Performed at [facility], [address] [place], [place] [address]   There may be more visits with results that are not included.    Allergies: Bee venom, Coconut flavoring agent (non-screening), Fish allergy, Geodon  [ziprasidone  hcl], Haloperidol  and related, Lithobid [lithium ], Roxicodone  [oxycodone ], Seroquel  [quetiapine ], Shellfish allergy, Phenergan  [promethazine  hcl], Prilosec [omeprazole], Sulfa antibiotics, Tegretol  [carbamazepine ], Prozac  [fluoxetine ], Tape, and Tylenol  [acetaminophen ]  Medications:  Facility Ordered Medications  Medication   OLANZapine  zydis (ZYPREXA ) disintegrating tablet 5 mg   OLANZapine  (ZYPREXA ) injection 5 mg   OLANZapine  (ZYPREXA ) injection 10 mg   hydrOXYzine  (ATARAX ) tablet  25 mg   risperiDONE  (RISPERDAL  M-TABS) disintegrating tablet 3 mg   metoprolol  tartrate (LOPRESSOR ) tablet 25 mg   prazosin  (MINIPRESS ) capsule 3 mg    traZODone  (DESYREL ) tablet 50 mg   alum & mag hydroxide-simeth (MAALOX/MYLANTA) 200-200-20 MG/5ML suspension 30 mL      Medical Decision Making  Recommend inpatient psychiatric admission for stabilization and treatment. Pt has poor judgement and is impulsive with an extensive history of suicide attempts.  Pt is currently endorsing passive SI. No plan yet. She is unable to contract for safety and requesting inpatient psychiatric hospitalization at the [facility].  Because she feels unsafe due to her traumas in the last 3 months. Based on her relevant past, she is a danger to herself and will benefit from inpatient psychiatric hospitalization.   Lab Orders         CBC with Differential/Platelet         Comprehensive metabolic panel         POC urine preg, ED         POCT Urine Drug Screen - (I-Screen)     EKG  Home medications provided by patient -Risperdal  3 mg PO BID for mood stabilization -Metoprolol  25 mg PO BID for HTN and SVT -Maalox 30 ml Q6h prn indigestion, heartburn -Prazosin  3 mg PO at bedtime for nightmares. -Trazodone  50 mg PO daily at bedtime for depressive symptoms, insomnia and anxiety  Prn meds -Atarax  25 mg PO TID Prn anxiety  -Prn agitation Protocol Medications   Recommendations  Based on my evaluation the patient does not appear to have an emergency medical condition.  Recommend inpatient psychiatric admission for stabilization and treatment.   Thurman LULLA Ivans, NP 07/31/23  6:08 AM

## 2023-07-31 NOTE — ED Notes (Signed)
Patient observed resting quietly, eyes closed. Respirations equal and unlabored. Will continue to monitor for safety.  

## 2023-09-25 ENCOUNTER — Ambulatory Visit (HOSPITAL_COMMUNITY)
Admission: EM | Admit: 2023-09-25 | Discharge: 2023-09-28 | Disposition: A | Discharge status: Other Acute Inpatient Hospital | Attending: Psychiatry | Admitting: Psychiatry

## 2023-09-25 DIAGNOSIS — Z9151 Personal history of suicidal behavior: Secondary | ICD-10-CM | POA: Diagnosis not present

## 2023-09-25 DIAGNOSIS — Z639 Problem related to primary support group, unspecified: Secondary | ICD-10-CM | POA: Diagnosis not present

## 2023-09-25 DIAGNOSIS — F603 Borderline personality disorder: Secondary | ICD-10-CM | POA: Diagnosis not present

## 2023-09-25 DIAGNOSIS — Z79899 Other long term (current) drug therapy: Secondary | ICD-10-CM | POA: Insufficient documentation

## 2023-09-25 DIAGNOSIS — R45851 Suicidal ideations: Secondary | ICD-10-CM | POA: Diagnosis not present

## 2023-09-25 DIAGNOSIS — F250 Schizoaffective disorder, bipolar type: Secondary | ICD-10-CM | POA: Diagnosis present

## 2023-09-25 DIAGNOSIS — G629 Polyneuropathy, unspecified: Secondary | ICD-10-CM | POA: Insufficient documentation

## 2023-09-25 DIAGNOSIS — Z91148 Patient's other noncompliance with medication regimen for other reason: Secondary | ICD-10-CM | POA: Insufficient documentation

## 2023-09-25 LAB — POCT URINE DRUG SCREEN - MANUAL ENTRY (I-SCREEN)
POC Amphetamine UR: NOT DETECTED
POC Buprenorphine (BUP): NOT DETECTED
POC Cocaine UR: NOT DETECTED
POC Marijuana UR: NOT DETECTED
POC Methadone UR: NOT DETECTED
POC Methamphetamine UR: NOT DETECTED
POC Morphine: NOT DETECTED
POC Oxazepam (BZO): NOT DETECTED
POC Oxycodone UR: NOT DETECTED
POC Secobarbital (BAR): NOT DETECTED

## 2023-09-25 LAB — POC URINE PREG, ED: Preg Test, Ur: NEGATIVE

## 2023-09-25 MED ORDER — ALUM & MAG HYDROXIDE-SIMETH 200-200-20 MG/5ML PO SUSP: 30.0000 mL | ORAL | Status: DC | PRN

## 2023-09-25 MED ORDER — OLANZAPINE 10 MG IM SOLR: 10.0000 mg | Freq: Three times a day (TID) | INTRAMUSCULAR | Status: DC | PRN

## 2023-09-25 MED ORDER — OLANZAPINE 5 MG PO TBDP
5.0000 mg | ORAL_TABLET | Freq: Three times a day (TID) | ORAL | Status: DC | PRN
  Filled 2023-09-25: qty 1

## 2023-09-25 MED ORDER — OLANZAPINE 10 MG IM SOLR: 5.0000 mg | Freq: Three times a day (TID) | INTRAMUSCULAR | Status: DC | PRN

## 2023-09-25 NOTE — BH Assessment (Signed)
 Comprehensive Clinical Assessment (CCA) Note  09/25/2023 [name] [id number]  Disposition: Gaither Pouch, NP recommends pt to be admitted to [facility] for Observation.   The patient demonstrates the following risk factors for suicide: Chronic risk factors for suicide include: psychiatric disorder of Schizoaffective Disorder, depressive type (HCC). , previous suicide attempts Pt has previous suicide attempts, and previous self-harm Pt has a history of self-harming behaviors. Acute risk factors for suicide include: Passive suicidal ideations, losing outpatient provider. Protective factors for this patient include: None. Considering these factors, the overall suicide risk at this point appears to be high. Patient is not appropriate for outpatient follow up.  [name] is a 33 year old female who presents voluntary and unaccompanied to [facility] Clinician asked the pt, what brought you to the hospital? Pt reports, I don't want to talk about it. Pt reports, she's suicidal with no plan. Pt has a history of suicide attempts. Pt denies, HI, hallucinations, self-injurious behaviors and access to weapons.   Pt reports, she's been off her medications for three days, her ACT Team and [employer] dropped her. Pt did not disclose what triggered her to be dropped by her outpatient providers. Pt came to [facility] as a walk-in on 07/31/2023 and was referred to [facility] on 07/31/2023. Pt has previous inpatient admissions for suicidal ideations, suicide attempts and self-harming behaviors.   Pt presents, guarded quiet, awake in casual attire with fleeting eye contact and normal speech. Pt's mood was depressed. Pt's affect was flat. Pt's insight, judgement are poor. Clinician asked the pt if she could contract for safety if discharged, pt replied, I don't know.   Chief Complaint:  Chief Complaint  Patient presents with    Medication Problem   Suicidal Thoughts    Visit Diagnosis: Schizoaffective Disorder, depressive type (HCC).    CCA Screening, Triage and Referral (STR)  Patient Reported Information How did you hear about us ? Legal System  What Is the Reason for Your Visit/Call Today? [name] is a 33 year old female presenting to [facility] escortd by BHRT. Pt states that she is having suicidal thoughts due to being off her meds. Pt states that she is struggling with family issues and this has caused her to stop taking her meds. Pt denies a plan to end her life at this time. Pt is looking for inpatient to be back on her medication. Pt denies substance use, Hi and AVH. Pt is cooperative, calm, appearance neat, speech normal, eye contact and motor activity is normal, affect is full, depressed mood  How Long Has This Been Causing You Problems? 1 wk - 1 month  What Do You Feel Would Help You the Most Today? Medication(s); Treatment for Depression or other mood problem   Have You Recently Had Any Thoughts About Hurting Yourself? Yes  Are You Planning to Commit Suicide/Harm Yourself At This time? No   Flowsheet Row ED from 09/25/2023 in [facility] ED from 07/31/2023 in [facility] ED to Hosp-Admission (Discharged) from 07/13/2023 in [place] [facility]  C-SSRS RISK CATEGORY High Risk High Risk High Risk    Have you Recently Had Thoughts About Hurting Someone [name]? No  Are You Planning to Harm Someone at This Time? No  Explanation: NA   Have You Used Any Alcohol  or Drugs in the Past 24 Hours? No  How Long Ago Did You Use Drugs or Alcohol ? Pt denies.  What Did You Use and How Much? Pt denies alcohol  or other substance use   Do You Currently Have a Therapist/Psychiatrist? No  Name of Therapist/Psychiatrist: Name of Therapist/Psychiatrist: Pt reports, her ACT Team and [employer] Team dropped her.   Have You Been Recently Discharged  From Any Office Practice or Programs? No  Explanation of Discharge From Practice/Program: Pt came to [facility] on 07/31/2023 and was referred to [facility] on 07/31/2023.     CCA Screening Triage Referral Assessment Type of Contact: Face-to-Face  Telemedicine Service Delivery:   Is this Initial or Reassessment?   Date Telepsych consult ordered in [facility]:    Time Telepsych consult ordered in [facility]L:    Location of Assessment: [facility] Assessment Services  Provider Location: [facility] Assessment Services   Collateral Involvement: NA   Does Patient Have a Automotive engineer Guardian? No  Legal Guardian Contact Information: NA  Copy of Legal Guardianship Form: -- (NA)  Legal Guardian Notified of Arrival: -- (NA)  Legal Guardian Notified of Pending Discharge: -- (NA)  If Minor and Not Living with Parent(s), Who has Custody? NA  Is CPS involved or ever been involved? In the Past  Is APS involved or ever been involved? Never   Patient Determined To Be At Risk for Harm To Self or Others Based on Review of Patient Reported Information or Presenting Complaint? Yes, for Self-Harm  Method: No Plan  Availability of Means: No access or NA  Intent: Vague intent or NA  Notification Required: No need or identified person  Additional Information for Danger to Others Potential: -- (NA)  Additional Comments for Danger to Others Potential: NA  Are There Guns or Other Weapons in Your Home? No  Types of Guns/Weapons: Pt denies.  Are These Weapons Safely Secured?                            -- (NA)  Who Could Verify You Are Able To Have These Secured: NA  Do You Have any Outstanding Charges, Pending Court Dates, Parole/Probation? Pt denies.  Contacted To Inform of Risk of Harm To Self or Others: Other: Comment (NA)    Does Patient Present under Involuntary Commitment? No    [place] of Residence: [name]   Patient Currently Receiving the Following Services: Not  Receiving Services   Determination of Need: Urgent (48 hours)   Options For Referral: Medication Management     CCA Biopsychosocial Patient Reported Schizophrenia/Schizoaffective Diagnosis in Past: Yes   Strengths: Pt is seeking help.   Mental Health Symptoms Depression:  Difficulty Concentrating; Tearfulness; Sleep (too much or little)   Duration of Depressive symptoms: Duration of Depressive Symptoms: Greater than two weeks   Mania:  None   Anxiety:   Difficulty concentrating   Psychosis:  None   Duration of Psychotic symptoms:    Trauma:  -- (Pt did not want to disclose recent trauma.)   Obsessions:  None   Compulsions:  None   Inattention:  None   Hyperactivity/Impulsivity:  None   Oppositional/Defiant Behaviors:  Angry   Emotional Irregularity:  Recurrent suicidal behaviors/gestures/threats; Potentially harmful impulsivity   Other Mood/Personality Symptoms:  NA    Mental Status Exam Appearance and self-care  Stature:  Average   Weight:  Overweight   Clothing:  Casual   Grooming:  Normal   Cosmetic use:  Age appropriate   Posture/gait:  Normal   Motor activity:  Not Remarkable  Sensorium  Attention:  Normal   Concentration:  Normal   Orientation:  X5   Recall/memory:  Normal   Affect and Mood  Affect:  Flat   Mood:  Depressed   Relating  Eye contact:  Fleeting   Facial expression:  Depressed   Attitude toward examiner:  Guarded   Thought and Language  Speech flow: Normal   Thought content:  Appropriate to Mood and Circumstances   Preoccupation:  Suicide   Hallucinations:  None   Organization:  Coherent   [employer] of Knowledge:  Average   Intelligence:  Needs investigation   Abstraction:  Normal   Judgement:  Poor   Reality Testing:  Distorted   Insight:  Poor   Decision Making:  Impulsive   Social Functioning  Social Maturity:  Impulsive   Social Judgement:  Victimized; Heedless    Stress  Stressors:  Other (Comment) (Pt denies, stressors.)   Coping Ability:  Overwhelmed   Skill Deficits:  Decision making; Interpersonal   Supports:  Friends/Service system     Religion: Religion/Spirituality Are You A Religious Person?: No What is Your Religious Affiliation?:  (Pt denies.) How Might This Affect Treatment?: NA  Leisure/Recreation: Leisure / Recreation Do You Have Hobbies?: Yes Leisure and Hobbies: Coloring, bubble baths.  Exercise/Diet: Exercise/Diet Do You Exercise?: No Have You Gained or Lost A Significant Amount of Weight in the Past Six Months?: No Do You Follow a Special Diet?: No Do You Have Any Trouble Sleeping?: Yes Explanation of Sleeping Difficulties: Pt reports, getting in 3-4 hours.   CCA Employment/Education Employment/Work Situation: Employment / Work Situation Employment Situation: On disability Why is Patient on Disability: Per chart, For medical, physical and mental health concerns. How Long has Patient Been on Disability: Per chart, years. Patient's Job has Been Impacted by Current Illness: No Has Patient ever Been in the [employer]?: No  Education: Education Is Patient Currently Attending School?: No Last Grade Completed: 12 Did You Attend College?: Yes What Type of College Degree Do you Have?: Pt reports, she has five degress and seven certificates. Did You Have An Individualized Education Program (IIEP): No Did You Have Any Difficulty At School?: No Patient's Education Has Been Impacted by Current Illness: No   CCA Family/Childhood History Family and Relationship History: Family history Widowed, when?: Per chart, 2018. Does patient have children?: Yes How many children?:  (Pt reports, children.) How is patient's relationship with their children?: Pt did not disclose.  Childhood History:  Childhood History By whom was/is the patient raised?: Both parents Did patient suffer any verbal/emotional/physical/sexual  abuse as a child?:  (Pt reports, past trauma.) Did patient suffer from severe childhood neglect?:  (UTA) Has patient ever been sexually abused/assaulted/raped as an adolescent or adult?:  (Pt reports, she does not want to discuss.) Type of abuse, by whom, and at what age: NA Was the patient ever a victim of a crime or a disaster?:  (UTA) How has this affected patient's relationships?: UTA Spoken with a professional about abuse?:  (UTA) Does patient feel these issues are resolved?:  (UTA) Witnessed domestic violence?:  (UTA) Has patient been affected by domestic violence as an adult?:  (UTA)       CCA Substance Use Alcohol /Drug Use: Alcohol  / Drug Use Pain Medications: See MAR Prescriptions: See MAR Over the Counter: See MAR History of alcohol  / drug use?: No history of alcohol  / drug abuse Longest period of sobriety (when/how long): NA Negative Consequences of Use:  (NA)  Withdrawal Symptoms: None    ASAM's:  Six Dimensions of Multidimensional Assessment  Dimension 1:  Acute Intoxication and/or Withdrawal Potential:      Dimension 2:  Biomedical Conditions and Complications:      Dimension 3:  Emotional, Behavioral, or Cognitive Conditions and Complications:     Dimension 4:  Readiness to Change:     Dimension 5:  Relapse, Continued use, or Continued Problem Potential:     Dimension 6:  Recovery/Living Environment:     ASAM Severity Score:    ASAM Recommended Level of Treatment:     Substance use Disorder (SUD)    Recommendations for Services/Supports/Treatments: Recommendations for Services/Supports/Treatments Recommendations For Services/Supports/Treatments: Other (Comment) (Pt to be admitted to [facility] for Observation.)  Disposition Recommendation per psychiatric provider: [facility]    DSM5 Diagnoses: Patient Active Problem List   Diagnosis Date Noted   Overdose 07/14/2023   Bipolar affective disorder, currently manic,  moderate (HCC) 02/20/2023   Intraventricular conduction delay 02/18/2023   Moderate persistent asthma 02/18/2023   Prediabetes 02/18/2023   GERD (gastroesophageal reflux disease) 02/18/2023   Paroxysmal atrial fibrillation (HCC) 02/18/2023   Intentional overdose of drug in tablet form (HCC) 02/17/2023   Prolonged Q-T interval on ECG 12/23/2022   Drug overdose 08/22/2022   Malingering 02/22/2022   Suicide attempt by cutting of wrist (HCC) 10/11/2021   Self-injurious behavior 07/19/2021   Schizoaffective disorder, bipolar type (HCC) 07/05/2021   Suicide attempt by drug ingestion (HCC) 07/04/2021   Paranoia (HCC) 04/22/2021   Intentional acetaminophen  overdose (HCC)    Lactic acidosis    Hyperprolactinemia (HCC) 08/20/2016   Seizure disorder (HCC) 08/08/2015   Migraines 07/27/2015   Borderline personality disorder (HCC) 10/31/2013   Asperger syndrome 06/15/2013     Referrals to Alternative Service(s): Referred to Alternative Service(s):   Place:   Date:   Time:    Referred to Alternative Service(s):   Place:   Date:   Time:    Referred to Alternative Service(s):   Place:   Date:   Time:    Referred to Alternative Service(s):   Place:   Date:   Time:     Jackson JONETTA Broach, [facility] Comprehensive Clinical Assessment (CCA) Screening, Triage and Referral Note  09/25/2023 Kyli Sorter [id number]  Chief Complaint:  Chief Complaint  Patient presents with   Medication Problem   Suicidal Thoughts    Visit Diagnosis:   Patient Reported Information How did you hear about us ? Legal System  What Is the Reason for Your Visit/Call Today? Bitting is a 33 year old female presenting to [facility] escortd by BHRT. Pt states that she is having suicidal thoughts due to being off her meds. Pt states that she is struggling with family issues and this has caused her to stop taking her meds. Pt denies a plan to end her life at this time. Pt is looking for inpatient to be back on her medication. Pt  denies substance use, Hi and AVH. Pt is cooperative, calm, appearance neat, speech normal, eye contact and motor activity is normal, affect is full, depressed mood  How Long Has This Been Causing You Problems? 1 wk - 1 month  What Do You Feel Would Help You the Most Today? Medication(s); Treatment for Depression or other mood problem   Have You Recently Had Any Thoughts About Hurting Yourself? Yes  Are You Planning to Commit Suicide/Harm Yourself At This time? No   Have you Recently Had  Thoughts About Hurting Someone [name]? No  Are You Planning to Harm Someone at This Time? No  Explanation: NA   Have You Used Any Alcohol  or Drugs in the Past 24 Hours? No  How Long Ago Did You Use Drugs or Alcohol ? Pt denies.  What Did You Use and How Much? Pt denies alcohol  or other substance use   Do You Currently Have a Therapist/Psychiatrist? No  Name of Therapist/Psychiatrist: Pt reports, her ACT Team and [employer] Team dropped her.   Have You Been Recently Discharged From Any Office Practice or Programs? No  Explanation of Discharge From Practice/Program: Pt came to [facility] on 07/31/2023 and was referred to [facility] on 07/31/2023.    CCA Screening Triage Referral Assessment Type of Contact: Face-to-Face  Telemedicine Service Delivery:   Is this Initial or Reassessment?   Date Telepsych consult ordered in CH[facility]:    Time Telepsych consult ordered in [facility]L:    Location of Assessment: [facility] Assessment Services  Provider Location: [facility] Assessment Services    Collateral Involvement: NA   Does Patient Have a Automotive engineer Guardian? No. Name and Contact of Legal Guardian: NA If Minor and Not Living with Parent(s), Who has Custody? NA  Is CPS involved or ever been involved? In the Past  Is APS involved or ever been involved? Never   Patient Determined To Be At Risk for Harm To Self or Others Based on Review of Patient Reported Information or Presenting  Complaint? Yes, for Self-Harm  Method: No Plan  Availability of Means: No access or NA  Intent: Vague intent or NA  Notification Required: No need or identified person  Additional Information for Danger to Others Potential: -- (NA)  Additional Comments for Danger to Others Potential: NA  Are There Guns or Other Weapons in Your Home? No  Types of Guns/Weapons: Pt denies.  Are These Weapons Safely Secured?                            -- (NA)  Who Could Verify You Are Able To Have These Secured: NA  Do You Have any Outstanding Charges, Pending Court Dates, Parole/Probation? Pt denies.  Contacted To Inform of Risk of Harm To Self or Others: Other: Comment (NA)   Does Patient Present under Involuntary Commitment? No    [place] of Residence: [name]   Patient Currently Receiving the Following Services: Not Receiving Services   Determination of Need: Urgent (48 hours)   Options For Referral: Medication Management   Disposition Recommendation per psychiatric provider: Pt to be admitted to [facility] for Observation.   Jackson JONETTA Broach, LCMHC     Cambell Stanek D Estil Vallee, MS, [facility], [facility] Triage Specialist [phone]

## 2023-09-25 NOTE — ED Provider Notes (Signed)
 [facility] Continuous Assessment Admission H&P  Date: 09/25/23 Patient Name: [name] MRN: [id number] Chief Complaint: has not had her medication in 3 day  Diagnoses:  Final diagnoses:  Schizoaffective disorder, bipolar type [facility])  Medication management    YEP:Djmjy Jacobowitz, 33 y/o female with a history of schizoaffective disorder, intentional overdose, borderline personality disorder, delusion.  Presented to [facility] via  BHRT, according to the patient she is having suicidal thoughts due to not being on her medications.  Patient stated that she struggled family issues that has caused her to stop taking her medicines.  Patient does have an extensive history of similar situations.    Face-to-face evaluation of patient, patient is alert and oriented x 4, speech is clear.  Patient mood is anxious and depressed.  Patient does not appear to be in any immediate distress.  Patient endorsed suicidal ideation but stating she has no plans, denies HI, AVH or paranoia.  Denies alcohol  or illicit drug use denies smoking.  Denies access to guns.  At this present moment patient does not seem to be influenced by internal stimuli however patient stated that she has not had her medicine in 3 days because she has been out and normally they would mail her medicine to her but they did not this time.  Given patient prior history and current presentation we will admit patient for observation.  Total Time spent with patient: 20 minutes  Musculoskeletal  Strength & Muscle Tone: within normal limits Gait & Station: normal Patient leans: N/A  Psychiatric Specialty Exam  Presentation General Appearance:  Casual  Eye Contact: Good  Speech: Clear and Coherent  Speech Volume: Normal  Handedness: Right   Mood and Affect  Mood: Anxious  Affect: Congruent   Thought Process  Thought Processes: Coherent  Descriptions of Associations:Intact  Orientation:Full (Time, Place and  Person)  Thought Content:WDL  Diagnosis of Schizophrenia or Schizoaffective disorder in past: Yes  Duration of Psychotic Symptoms: Greater than six months  Hallucinations:Hallucinations: None  Ideas of Reference:None  Suicidal Thoughts:Suicidal Thoughts: Yes, Passive SI Passive Intent and/or Plan: Without Intent; Without Plan  Homicidal Thoughts:Homicidal Thoughts: No   Sensorium  Memory: Immediate Fair  Judgment: Poor  Insight: Shallow   Executive Functions  Concentration: Good  Attention Span: Good  Recall: Good  Fund of Knowledge: Good  Language: Good   Psychomotor Activity  Psychomotor Activity: Psychomotor Activity: Normal   Assets  Assets: Desire for Improvement   Sleep  Sleep: Sleep: Fair Number of Hours of Sleep: 8   Nutritional Assessment (For OBS and FBC admissions only) Has the patient had a weight loss or gain of 10 pounds or more in the last 3 months?: No Has the patient had a decrease in food intake/or appetite?: No Does the patient have dental problems?: No Does the patient have eating habits or behaviors that may be indicators of an eating disorder including binging or inducing vomiting?: No Has the patient recently lost weight without trying?: 0 Has the patient been eating poorly because of a decreased appetite?: 0 Malnutrition Screening Tool Score: 0    Physical Exam HENT:     Head: Normocephalic.     Nose: Nose normal.  Eyes:     Pupils: Pupils are equal, round, and reactive to light.  Cardiovascular:     Rate and Rhythm: Normal rate.  Pulmonary:     Effort: Pulmonary effort is normal.  Musculoskeletal:        General: Normal range of motion.  Cervical back: Normal range of motion.  Neurological:     General: No focal deficit present.     Mental Status: She is alert.  Psychiatric:        Mood and Affect: Mood normal.        Behavior: Behavior normal.        Thought Content: Thought content normal.         Judgment: Judgment normal.    Review of Systems  Constitutional: Negative.   HENT: Negative.    Eyes: Negative.   Respiratory: Negative.    Cardiovascular: Negative.   Gastrointestinal: Negative.   Genitourinary: Negative.   Musculoskeletal: Negative.   Skin: Negative.   Neurological: Negative.   Psychiatric/Behavioral:  Positive for suicidal ideas. The patient is nervous/anxious.     Blood pressure 108/87, pulse 99, temperature 97.8 F (36.6 C), temperature source Oral, resp. rate 20, SpO2 99%. There is no height or weight on file to calculate BMI.  Past Psychiatric History: Schizoaffective disorder, borderline personality disorder, intentional overdose,  Is the patient at risk to self? Yes  Has the patient been a risk to self in the past 6 months? Yes .    Has the patient been a risk to self within the distant past? Yes   Is the patient a risk to others? No   Has the patient been a risk to others in the past 6 months? No   Has the patient been a risk to others within the distant past? No   Past Medical History: See chart  Family History: Known  Social History: Denies  Last Labs:  Admission on 07/31/2023, Discharged on 07/31/2023  Component Date Value Ref Range Status   WBC 07/31/2023 9.1  4.0 - 10.5 K/uL Final   RBC 07/31/2023 4.46  3.87 - 5.11 MIL/uL Final   Hemoglobin 07/31/2023 11.5 (L)  12.0 - 15.0 g/dL Final   HCT 93/74/7974 36.0  36.0 - 46.0 % Final   MCV 07/31/2023 80.7  80.0 - 100.0 fL Final   MCH 07/31/2023 25.8 (L)  26.0 - 34.0 pg Final   MCHC 07/31/2023 31.9  30.0 - 36.0 g/dL Final   RDW 93/74/7974 14.8  11.5 - 15.5 % Final   Platelets 07/31/2023 250  150 - 400 K/uL Final   nRBC 07/31/2023 0.0  0.0 - 0.2 % Final   Neutrophils Relative % 07/31/2023 67  % Final   Neutro Abs 07/31/2023 6.1  1.7 - 7.7 K/uL Final   Lymphocytes Relative 07/31/2023 25  % Final   Lymphs Abs 07/31/2023 2.3  0.7 - 4.0 K/uL Final   Monocytes Relative 07/31/2023 7  % Final    Monocytes Absolute 07/31/2023 0.6  0.1 - 1.0 K/uL Final   Eosinophils Relative 07/31/2023 1  % Final   Eosinophils Absolute 07/31/2023 0.1  0.0 - 0.5 K/uL Final   Basophils Relative 07/31/2023 0  % Final   Basophils Absolute 07/31/2023 0.0  0.0 - 0.1 K/uL Final   Immature Granulocytes 07/31/2023 0  % Final   Abs Immature Granulocytes 07/31/2023 0.02  0.00 - 0.07 K/uL Final   Performed at [facility], [address] [place], [place] [address]   Sodium 07/31/2023 138  135 - 145 mmol/L Final   Potassium 07/31/2023 3.8  3.5 - 5.1 mmol/L Final   Chloride 07/31/2023 105  98 - 111 mmol/L Final   CO2 07/31/2023 23  22 - 32 mmol/L Final   Glucose, Bld 07/31/2023 92  70 - 99  mg/dL Final   Glucose reference range applies only to samples taken after fasting for at least 8 hours.   BUN 07/31/2023 12  6 - 20 mg/dL Final   Creatinine, Ser 07/31/2023 0.82  0.44 - 1.00 mg/dL Final   Calcium  07/31/2023 9.4  8.9 - 10.3 mg/dL Final   Total Protein 93/74/7974 6.8  6.5 - 8.1 g/dL Final   Albumin 93/74/7974 4.1  3.5 - 5.0 g/dL Final   AST 93/74/7974 17  15 - 41 U/L Final   ALT 07/31/2023 20  0 - 44 U/L Final   Alkaline Phosphatase 07/31/2023 58  38 - 126 U/L Final   Total Bilirubin 07/31/2023 0.5  0.0 - 1.2 mg/dL Final   GFR, Estimated 07/31/2023 >60  >60 mL/min Final   Comment: (NOTE) Calculated using the CKD-EPI Creatinine Equation (2021)    Anion gap 07/31/2023 10  5 - 15 Final   Performed at [facility], [address] [place], [place] [address]   Preg Test, Ur 07/31/2023 Negative  Negative Final   POC Amphetamine UR 07/31/2023 None Detected  NONE DETECTED (Cut Off Level 1000 ng/mL) Final   POC Secobarbital (BAR) 07/31/2023 None Detected  NONE DETECTED (Cut Off Level 300 ng/mL) Final   POC Buprenorphine (BUP) 07/31/2023 None Detected  NONE DETECTED (Cut Off Level 10 ng/mL) Final   POC Oxazepam (BZO) 07/31/2023 None Detected  NONE DETECTED (Cut Off Level 300 ng/mL) Final   POC Cocaine  UR 07/31/2023 None Detected  NONE DETECTED (Cut Off Level 300 ng/mL) Final   POC Methamphetamine UR 07/31/2023 None Detected  NONE DETECTED (Cut Off Level 1000 ng/mL) Final   POC Morphine  07/31/2023 None Detected  NONE DETECTED (Cut Off Level 300 ng/mL) Final   POC Methadone UR 07/31/2023 None Detected  NONE DETECTED (Cut Off Level 300 ng/mL) Final   POC Oxycodone  UR 07/31/2023 None Detected  NONE DETECTED (Cut Off Level 100 ng/mL) Final   POC Marijuana UR 07/31/2023 None Detected  NONE DETECTED (Cut Off Level 50 ng/mL) Final   SARSCOV2ONAVIRUS 2 AG 07/31/2023 NEGATIVE  NEGATIVE Final   Comment: (NOTE) SARS-CoV-2 antigen NOT DETECTED.   Negative results are presumptive.  Negative results do not preclude SARS-CoV-2 infection and should not be used as the sole basis for treatment or other patient management decisions, including infection  control decisions, particularly in the presence of clinical signs and  symptoms consistent with COVID-19, or in those who have been in contact with the virus.  Negative results must be combined with clinical observations, patient history, and epidemiological information. The expected result is Negative.  Fact Sheet for Patients: https://www.jennings-kim.com/  Fact Sheet for Healthcare Providers: https://alexander-rogers.biz/  This test is not yet approved or cleared by the [place]  FDA and  has been authorized for detection and/or diagnosis of SARS-CoV-2 by FDA under an Emergency Use Authorization (EUA).  This EUA will remain in effect (meaning this test can be used) for the duration of  the COV                          ID-19 declaration under Section 564(b)(1) of the Act, 21 U.S.C. section 360bbb-3(b)(1), unless the authorization is terminated or revoked sooner.    Admission on 07/13/2023, Discharged on 07/17/2023  Component Date Value Ref Range Status   Sodium 07/13/2023 141  135 - 145 mmol/L Final   Potassium  07/13/2023 3.6  3.5 - 5.1 mmol/L Final   Chloride 07/13/2023  109  98 - 111 mmol/L Final   CO2 07/13/2023 22  22 - 32 mmol/L Final   Glucose, Bld 07/13/2023 110 (H)  70 - 99 mg/dL Final   Glucose reference range applies only to samples taken after fasting for at least 8 hours.   BUN 07/13/2023 <5 (L)  6 - 20 mg/dL Final   Creatinine, Ser 07/13/2023 0.68  0.44 - 1.00 mg/dL Final   Calcium  07/13/2023 9.1  8.9 - 10.3 mg/dL Final   Total Protein 93/92/7974 6.9  6.5 - 8.1 g/dL Final   Albumin 93/92/7974 3.7  3.5 - 5.0 g/dL Final   AST 93/92/7974 20  15 - 41 U/L Final   ALT 07/13/2023 18  0 - 44 U/L Final   Alkaline Phosphatase 07/13/2023 65  38 - 126 U/L Final   Total Bilirubin 07/13/2023 0.3  0.0 - 1.2 mg/dL Final   GFR, Estimated 07/13/2023 >60  >60 mL/min Final   Comment: (NOTE) Calculated using the CKD-EPI Creatinine Equation (2021)    Anion gap 07/13/2023 10  5 - 15 Final   Performed at [facility], [address] [place], [place] [address]   Alcohol , Ethyl (B) 07/13/2023 <15  <15 mg/dL Final   Comment: (NOTE) For medical purposes only. Performed at [facility], [address], [place], [place] [address]    Salicylate Lvl 07/13/2023 <7.0 (L)  7.0 - 30.0 mg/dL Final   Performed at [facility], [address], [place], [place] [address]   WBC 07/13/2023 7.6  4.0 - 10.5 K/uL Final   RBC 07/13/2023 4.57  3.87 - 5.11 MIL/uL Final   Hemoglobin 07/13/2023 11.5 (L)  12.0 - 15.0 g/dL Final   HCT 93/92/7974 37.2  36.0 - 46.0 % Final   MCV 07/13/2023 81.4  80.0 - 100.0 fL Final   MCH 07/13/2023 25.2 (L)  26.0 - 34.0 pg Final   MCHC 07/13/2023 30.9  30.0 - 36.0 g/dL Final   RDW 93/92/7974 14.8  11.5 - 15.5 % Final   Platelets 07/13/2023 263  150 - 400 K/uL Final   nRBC 07/13/2023 0.0  0.0 - 0.2 % Final   Neutrophils Relative % 07/13/2023 59  % Final   Neutro Abs 07/13/2023 4.5  1.7 - 7.7 K/uL Final   Lymphocytes Relative 07/13/2023 33  % Final   Lymphs Abs 07/13/2023  2.5  0.7 - 4.0 K/uL Final   Monocytes Relative 07/13/2023 6  % Final   Monocytes Absolute 07/13/2023 0.5  0.1 - 1.0 K/uL Final   Eosinophils Relative 07/13/2023 1  % Final   Eosinophils Absolute 07/13/2023 0.1  0.0 - 0.5 K/uL Final   Basophils Relative 07/13/2023 1  % Final   Basophils Absolute 07/13/2023 0.1  0.0 - 0.1 K/uL Final   Immature Granulocytes 07/13/2023 0  % Final   Abs Immature Granulocytes 07/13/2023 0.03  0.00 - 0.07 K/uL Final   Performed at [facility], [address] [place], [place] [address]   Opiates 07/13/2023 NONE DETECTED  NONE DETECTED Final   Cocaine 07/13/2023 NONE DETECTED  NONE DETECTED Final   Benzodiazepines 07/13/2023 NONE DETECTED  NONE DETECTED Final   Amphetamines 07/13/2023 NONE DETECTED  NONE DETECTED Final   Tetrahydrocannabinol 07/13/2023 NONE DETECTED  NONE DETECTED Final   Barbiturates 07/13/2023 NONE DETECTED  NONE DETECTED Final   Comment: (NOTE) DRUG SCREEN FOR MEDICAL PURPOSES ONLY.  IF CONFIRMATION IS NEEDED FOR ANY PURPOSE, NOTIFY LAB WITHIN 5 DAYS.  LOWEST DETECTABLE LIMITS FOR URINE DRUG SCREEN Drug Class                     Cutoff (ng/mL) Amphetamine and metabolites    1000 Barbiturate and metabolites    200 Benzodiazepine                 200 Opiates and metabolites        300 Cocaine and metabolites        300 THC                            50 Performed at [facility], [address], [place], [place] [address]    Preg, Serum 07/13/2023 NEGATIVE  NEGATIVE Final   Comment:        THE SENSITIVITY OF THIS METHODOLOGY IS >10 mIU/mL. Performed at [facility], [address] [place], [place] [address]    Color, Urine 07/13/2023 YELLOW  YELLOW Final   APPearance 07/13/2023 HAZY (A)  CLEAR Final   Specific Gravity, Urine 07/13/2023 1.016  1.005 - 1.030 Final   pH 07/13/2023 5.0  5.0 - 8.0 Final   Glucose, UA 07/13/2023 NEGATIVE  NEGATIVE mg/dL Final   Hgb urine dipstick 07/13/2023 NEGATIVE  NEGATIVE Final    Bilirubin Urine 07/13/2023 NEGATIVE  NEGATIVE Final   Ketones, ur 07/13/2023 NEGATIVE  NEGATIVE mg/dL Final   Protein, ur 93/92/7974 NEGATIVE  NEGATIVE mg/dL Final   Nitrite 93/92/7974 NEGATIVE  NEGATIVE Final   Leukocytes,Ua 07/13/2023 NEGATIVE  NEGATIVE Final   Performed at [facility], [address] [place], [place] [address]   Acetaminophen  (Tylenol ), Serum 07/13/2023 <10 (L)  10 - 30 ug/mL Final   Comment: (NOTE) Therapeutic concentrations vary significantly. A range of 10-30 ug/mL  may be an effective concentration for many patients. However, some  are best treated at concentrations outside of this range. Acetaminophen  concentrations >150 ug/mL at 4 hours after ingestion  and >50 ug/mL at 12 hours after ingestion are often associated with  toxic reactions.  Performed at [facility], [address], [place], [place] [address]    Acetaminophen  (Tylenol ), Serum 07/13/2023 <10 (L)  10 - 30 ug/mL Final   Comment: (NOTE) Therapeutic concentrations vary significantly. A range of 10-30 ug/mL  may be an effective concentration for many patients. However, some  are best treated at concentrations outside of this range. Acetaminophen  concentrations >150 ug/mL at 4 hours after ingestion  and >50 ug/mL at 12 hours after ingestion are often associated with  toxic reactions.  Performed at [facility], [address] [place], [place] [address]    Magnesium  07/13/2023 1.5 (L)  1.7 - 2.4 mg/dL Final   Performed at [facility], [address], [place], [place] [address]   Lactic Acid, Venous 07/13/2023 2.1 (HH)  0.5 - 1.9 mmol/L Final   Comment: CRITICAL RESULT CALLED TO, READ BACK BY AND VERIFIED WITH WEBER MORRIS R RN @1348  07/13/23 GORMAN ANTES Performed at [facility], [address], [place], [place] [address]    Sodium 07/14/2023 137  135 - 145 mmol/L Final   Potassium 07/14/2023 3.5  3.5 - 5.1 mmol/L Final   Chloride 07/14/2023 108  98 - 111  mmol/L Final   CO2 07/14/2023 23  22 - 32 mmol/L Final   Glucose, Bld 07/14/2023 92  70 - 99 mg/dL Final  Glucose reference range applies only to samples taken after fasting for at least 8 hours.   BUN 07/14/2023 5 (L)  6 - 20 mg/dL Final   Creatinine, Ser 07/14/2023 0.70  0.44 - 1.00 mg/dL Final   Calcium  07/14/2023 8.2 (L)  8.9 - 10.3 mg/dL Final   Total Protein 93/91/7974 5.9 (L)  6.5 - 8.1 g/dL Final   Albumin 93/91/7974 3.1 (L)  3.5 - 5.0 g/dL Final   AST 93/91/7974 19  15 - 41 U/L Final   ALT 07/14/2023 18  0 - 44 U/L Final   Alkaline Phosphatase 07/14/2023 55  38 - 126 U/L Final   Total Bilirubin 07/14/2023 0.4  0.0 - 1.2 mg/dL Final   GFR, Estimated 07/14/2023 >60  >60 mL/min Final   Comment: (NOTE) Calculated using the CKD-EPI Creatinine Equation (2021)    Anion gap 07/14/2023 6  5 - 15 Final   Performed at [facility], [address] [place], [place] [address]   WBC 07/14/2023 6.9  4.0 - 10.5 K/uL Final   RBC 07/14/2023 4.14  3.87 - 5.11 MIL/uL Final   Hemoglobin 07/14/2023 10.7 (L)  12.0 - 15.0 g/dL Final   HCT 93/91/7974 33.6 (L)  36.0 - 46.0 % Final   MCV 07/14/2023 81.2  80.0 - 100.0 fL Final   MCH 07/14/2023 25.8 (L)  26.0 - 34.0 pg Final   MCHC 07/14/2023 31.8  30.0 - 36.0 g/dL Final   RDW 93/91/7974 14.6  11.5 - 15.5 % Final   Platelets 07/14/2023 253  150 - 400 K/uL Final   nRBC 07/14/2023 0.0  0.0 - 0.2 % Final   Performed at [facility], [address], [place], [place] [address]   Magnesium  07/14/2023 1.9  1.7 - 2.4 mg/dL Final   Performed at [facility], [address], [place], [place] [address]  Admission on 06/18/2023, Discharged on 06/18/2023  Component Date Value Ref Range Status   WBC 06/18/2023 8.5  4.0 - 10.5 K/uL Final   RBC 06/18/2023 4.31  3.87 - 5.11 MIL/uL Final   Hemoglobin 06/18/2023 10.8 (L)  12.0 - 15.0 g/dL Final   HCT 94/86/7974 35.1 (L)  36.0 - 46.0 % Final   MCV 06/18/2023 81.4  80.0 - 100.0 fL Final   MCH  06/18/2023 25.1 (L)  26.0 - 34.0 pg Final   MCHC 06/18/2023 30.8  30.0 - 36.0 g/dL Final   RDW 94/86/7974 15.0  11.5 - 15.5 % Final   Platelets 06/18/2023 255  150 - 400 K/uL Final   nRBC 06/18/2023 0.0  0.0 - 0.2 % Final   Performed at [facility], [address] [place], [place] [address]   Sodium 06/18/2023 138  135 - 145 mmol/L Final   Potassium 06/18/2023 3.8  3.5 - 5.1 mmol/L Final   Chloride 06/18/2023 106  98 - 111 mmol/L Final   CO2 06/18/2023 24  22 - 32 mmol/L Final   Glucose, Bld 06/18/2023 90  70 - 99 mg/dL Final   Glucose reference range applies only to samples taken after fasting for at least 8 hours.   BUN 06/18/2023 <5 (L)  6 - 20 mg/dL Final   Creatinine, Ser 06/18/2023 0.66  0.44 - 1.00 mg/dL Final   Calcium  06/18/2023 8.7 (L)  8.9 - 10.3 mg/dL Final   Total Protein 94/86/7974 6.4 (L)  6.5 - 8.1 g/dL Final   Albumin 94/86/7974 3.2 (L)  3.5 - 5.0 g/dL  Final   AST 06/18/2023 20  15 - 41 U/L Final   ALT 06/18/2023 20  0 - 44 U/L Final   Alkaline Phosphatase 06/18/2023 67  38 - 126 U/L Final   Total Bilirubin 06/18/2023 0.5  0.0 - 1.2 mg/dL Final   GFR, Estimated 06/18/2023 >60  >60 mL/min Final   Comment: (NOTE) Calculated using the CKD-EPI Creatinine Equation (2021)    Anion gap 06/18/2023 8  5 - 15 Final   Performed at [facility], [address], [place], [place] [address]   Color, Urine 06/18/2023 YELLOW  YELLOW Final   APPearance 06/18/2023 HAZY (A)  CLEAR Final   Specific Gravity, Urine 06/18/2023 1.017  1.005 - 1.030 Final   pH 06/18/2023 6.0  5.0 - 8.0 Final   Glucose, UA 06/18/2023 NEGATIVE  NEGATIVE mg/dL Final   Hgb urine dipstick 06/18/2023 NEGATIVE  NEGATIVE Final   Bilirubin Urine 06/18/2023 NEGATIVE  NEGATIVE Final   Ketones, ur 06/18/2023 NEGATIVE  NEGATIVE mg/dL Final   Protein, ur 94/86/7974 NEGATIVE  NEGATIVE mg/dL Final   Nitrite 94/86/7974 NEGATIVE  NEGATIVE Final   Leukocytes,Ua 06/18/2023 TRACE (A)   NEGATIVE Final   RBC / HPF 06/18/2023 0-5  0 - 5 RBC/hpf Final   WBC, UA 06/18/2023 11-20  0 - 5 WBC/hpf Final   Bacteria, UA 06/18/2023 RARE (A)  NONE SEEN Final   Squamous Epithelial / HPF 06/18/2023 6-10  0 - 5 /HPF Final   Mucus 06/18/2023 PRESENT   Final   Performed at [facility], [address] [place], [place] [address]   Opiates 06/18/2023 NONE DETECTED  NONE DETECTED Final   Cocaine 06/18/2023 NONE DETECTED  NONE DETECTED Final   Benzodiazepines 06/18/2023 NONE DETECTED  NONE DETECTED Final   Amphetamines 06/18/2023 NONE DETECTED  NONE DETECTED Final   Tetrahydrocannabinol 06/18/2023 NONE DETECTED  NONE DETECTED Final   Barbiturates 06/18/2023 NONE DETECTED  NONE DETECTED Final   Comment: (NOTE) DRUG SCREEN FOR MEDICAL PURPOSES ONLY.  IF CONFIRMATION IS NEEDED FOR ANY PURPOSE, NOTIFY LAB WITHIN 5 DAYS.  LOWEST DETECTABLE LIMITS FOR URINE DRUG SCREEN Drug Class                     Cutoff (ng/mL) Amphetamine and metabolites    1000 Barbiturate and metabolites    200 Benzodiazepine                 200 Opiates and metabolites        300 Cocaine and metabolites        300 THC                            50 Performed at [facility], [address] [place], [place] [address]    Preg Test, Ur 06/18/2023 NEGATIVE  NEGATIVE Final   Comment:        THE SENSITIVITY OF THIS METHODOLOGY IS >25 mIU/mL. Performed at [facility], [address], [place], [place] [address]    Troponin I (High Sensitivity) 06/18/2023 <2  <18 ng/L Final   Comment: (NOTE) Elevated high sensitivity troponin I (hsTnI) values and significant  changes across serial measurements may suggest ACS but many other  chronic and acute conditions are known to elevate hsTnI results.  Refer to the Links section for chest pain algorithms and additional  guidance. Performed at [facility], [address]  [place], [place] [address]     Acetaminophen  (Tylenol ), Serum 06/18/2023 <10 (L)  10 - 30 ug/mL Final   Comment: (NOTE) Therapeutic concentrations vary significantly. A range of 10-30 ug/mL  may be an effective concentration for many patients. However, some  are best treated at concentrations outside of this range. Acetaminophen  concentrations >150 ug/mL at 4 hours after ingestion  and >50 ug/mL at 12 hours after ingestion are often associated with  toxic reactions.  Performed at [facility], [address] [place], [place] [address]    Salicylate Lvl 06/18/2023 <7.0 (L)  7.0 - 30.0 mg/dL Final   Performed at [facility], [address] [place], [place] [address]   D-Dimer, Earleen 06/18/2023 <0.27  0.00 - 0.50 ug/mL-FEU Final   Comment: (NOTE) At the manufacturer cut-off value of 0.5 g/mL FEU, this assay has a negative predictive value of 95-100%.This assay is intended for use in conjunction with a clinical pretest probability (PTP) assessment model to exclude pulmonary embolism (PE) and deep venous thrombosis (DVT) in outpatients suspected of PE or DVT. Results should be correlated with clinical presentation. Performed at [facility], [address], [place], [place] [address]   Admission on 06/17/2023, Discharged on 06/17/2023  Component Date Value Ref Range Status   Sodium 06/17/2023 138  135 - 145 mmol/L Final   Potassium 06/17/2023 3.6  3.5 - 5.1 mmol/L Final   Chloride 06/17/2023 102  98 - 111 mmol/L Final   CO2 06/17/2023 27  22 - 32 mmol/L Final   Glucose, Bld 06/17/2023 112 (H)  70 - 99 mg/dL Final   Glucose reference range applies only to samples taken after fasting for at least 8 hours.   BUN 06/17/2023 5 (L)  6 - 20 mg/dL Final   Creatinine, Ser 06/17/2023 0.77  0.44 - 1.00 mg/dL Final   Calcium  06/17/2023 9.1  8.9 - 10.3 mg/dL Final   Total Protein 94/87/7974 7.0  6.5 - 8.1 g/dL Final   Albumin 94/87/7974 3.7  3.5 - 5.0 g/dL Final    AST 94/87/7974 21  15 - 41 U/L Final   ALT 06/17/2023 22  0 - 44 U/L Final   Alkaline Phosphatase 06/17/2023 68  38 - 126 U/L Final   Total Bilirubin 06/17/2023 0.5  0.0 - 1.2 mg/dL Final   GFR, Estimated 06/17/2023 >60  >60 mL/min Final   Comment: (NOTE) Calculated using the CKD-EPI Creatinine Equation (2021)    Anion gap 06/17/2023 9  5 - 15 Final   Performed at [facility], [address], [place], [place] [address]   Alcohol , Ethyl (B) 06/17/2023 <15  <15 mg/dL Final   Comment: Please note change in reference range. (NOTE) For medical purposes only. Performed at [facility], [address], [place], [place] [address]    WBC 06/17/2023 9.6  4.0 - 10.5 K/uL Final   RBC 06/17/2023 4.66  3.87 - 5.11 MIL/uL Final   Hemoglobin 06/17/2023 11.6 (L)  12.0 - 15.0 g/dL Final   HCT 94/87/7974 37.4  36.0 - 46.0 % Final   MCV 06/17/2023 80.3  80.0 - 100.0 fL Final   MCH 06/17/2023 24.9 (L)  26.0 - 34.0 pg Final   MCHC 06/17/2023 31.0  30.0 - 36.0 g/dL Final   RDW 94/87/7974 14.9  11.5 - 15.5 % Final   Platelets 06/17/2023 275  150 - 400 K/uL Final   nRBC 06/17/2023 0.0  0.0 - 0.2 % Final  Performed at [facility], [address], [place], [place] [address]   Opiates 06/17/2023 NONE DETECTED  NONE DETECTED Final   Cocaine 06/17/2023 NONE DETECTED  NONE DETECTED Final   Benzodiazepines 06/17/2023 NONE DETECTED  NONE DETECTED Final   Amphetamines 06/17/2023 NONE DETECTED  NONE DETECTED Final   Tetrahydrocannabinol 06/17/2023 NONE DETECTED  NONE DETECTED Final   Barbiturates 06/17/2023 NONE DETECTED  NONE DETECTED Final   Comment: (NOTE) DRUG SCREEN FOR MEDICAL PURPOSES ONLY.  IF CONFIRMATION IS NEEDED FOR ANY PURPOSE, NOTIFY LAB WITHIN 5 DAYS.  LOWEST DETECTABLE LIMITS FOR URINE DRUG SCREEN Drug Class                     Cutoff (ng/mL) Amphetamine and metabolites    1000 Barbiturate and metabolites    200 Benzodiazepine                 200 Opiates and  metabolites        300 Cocaine and metabolites        300 THC                            50 Performed at [facility], [address] [place], [place] [address]    Preg, Serum 06/17/2023 NEGATIVE  NEGATIVE Final   Comment:        THE SENSITIVITY OF THIS METHODOLOGY IS >10 mIU/mL. Performed at [facility], [address], [place], [place] [address]    Troponin I (High Sensitivity) 06/17/2023 5  <18 ng/L Final   Comment: (NOTE) Elevated high sensitivity troponin I (hsTnI) values and significant  changes across serial measurements may suggest ACS but many other  chronic and acute conditions are known to elevate hsTnI results.  Refer to the Links section for chest pain algorithms and additional  guidance. Performed at [facility], [address], [place], [place] [address]    Troponin I (High Sensitivity) 06/17/2023 3  <18 ng/L Final   Comment: (NOTE) Elevated high sensitivity troponin I (hsTnI) values and significant  changes across serial measurements may suggest ACS but many other  chronic and acute conditions are known to elevate hsTnI results.  Refer to the Links section for chest pain algorithms and additional  guidance. Performed at [facility], [address], [place], [place] [address]   Admission on 05/07/2023, Discharged on 05/07/2023  Component Date Value Ref Range Status   WBC 05/07/2023 8.0  4.0 - 10.5 K/uL Final   RBC 05/07/2023 4.53  3.87 - 5.11 MIL/uL Final   Hemoglobin 05/07/2023 11.3 (L)  12.0 - 15.0 g/dL Final   HCT 95/98/7974 36.5  36.0 - 46.0 % Final   MCV 05/07/2023 80.6  80.0 - 100.0 fL Final   MCH 05/07/2023 24.9 (L)  26.0 - 34.0 pg Final   MCHC 05/07/2023 31.0  30.0 - 36.0 g/dL Final   RDW 95/98/7974 14.9  11.5 - 15.5 % Final   Platelets 05/07/2023 288  150 - 400 K/uL Final   nRBC 05/07/2023 0.0  0.0 - 0.2 % Final   Performed at [facility], [address], [place], [place] [address]   Sodium  05/07/2023 136  135 - 145 mmol/L Final   Potassium 05/07/2023 3.9  3.5 - 5.1 mmol/L Final   Chloride 05/07/2023 107  98 - 111 mmol/L Final   CO2 05/07/2023 20 (L)  22 -  32 mmol/L Final   Glucose, Bld 05/07/2023 98  70 - 99 mg/dL Final   Glucose reference range applies only to samples taken after fasting for at least 8 hours.   BUN 05/07/2023 9  6 - 20 mg/dL Final   Creatinine, Ser 05/07/2023 0.74  0.44 - 1.00 mg/dL Final   Calcium  05/07/2023 9.0  8.9 - 10.3 mg/dL Final   GFR, Estimated 05/07/2023 >60  >60 mL/min Final   Comment: (NOTE) Calculated using the CKD-EPI Creatinine Equation (2021)    Anion gap 05/07/2023 9  5 - 15 Final   Performed at [facility], [address], [place], [place] [address]   Troponin I (High Sensitivity) 05/07/2023 <2  <18 ng/L Final   Comment: (NOTE) Elevated high sensitivity troponin I (hsTnI) values and significant  changes across serial measurements may suggest ACS but many other  chronic and acute conditions are known to elevate hsTnI results.  Refer to the Links section for chest pain algorithms and additional  guidance. Performed at [facility], [address] [place], [place] [address]   Admission on 04/30/2023, Discharged on 05/01/2023  Component Date Value Ref Range Status   Alcohol , Ethyl (B) 04/30/2023 <10  <10 mg/dL Final   Comment: (NOTE) Lowest detectable limit for serum alcohol  is 10 mg/dL.  For medical purposes only. Performed at [facility], [address], [place], [place] [address]    Opiates 04/30/2023 NONE DETECTED  NONE DETECTED Final   Cocaine 04/30/2023 NONE DETECTED  NONE DETECTED Final   Benzodiazepines 04/30/2023 NONE DETECTED  NONE DETECTED Final   Amphetamines 04/30/2023 NONE DETECTED  NONE DETECTED Final   Tetrahydrocannabinol 04/30/2023 NONE DETECTED  NONE DETECTED Final   Barbiturates 04/30/2023 NONE DETECTED  NONE DETECTED Final   Comment: (NOTE) DRUG  SCREEN FOR MEDICAL PURPOSES ONLY.  IF CONFIRMATION IS NEEDED FOR ANY PURPOSE, NOTIFY LAB WITHIN 5 DAYS.  LOWEST DETECTABLE LIMITS FOR URINE DRUG SCREEN Drug Class                     Cutoff (ng/mL) Amphetamine and metabolites    1000 Barbiturate and metabolites    200 Benzodiazepine                 200 Opiates and metabolites        300 Cocaine and metabolites        300 THC                            50 Performed at [facility], [address], [place], [place] [address]    WBC 04/30/2023 7.4  4.0 - 10.5 K/uL Final   RBC 04/30/2023 4.41  3.87 - 5.11 MIL/uL Final   Hemoglobin 04/30/2023 10.9 (L)  12.0 - 15.0 g/dL Final   HCT 96/74/7974 35.9 (L)  36.0 - 46.0 % Final   MCV 04/30/2023 81.4  80.0 - 100.0 fL Final   MCH 04/30/2023 24.7 (L)  26.0 - 34.0 pg Final   MCHC 04/30/2023 30.4  30.0 - 36.0 g/dL Final   RDW 96/74/7974 15.5  11.5 - 15.5 % Final   Platelets 04/30/2023 267  150 - 400 K/uL Final   nRBC 04/30/2023 0.0  0.0 - 0.2 % Final   Neutrophils Relative % 04/30/2023 64  % Final   Neutro Abs 04/30/2023 4.7  1.7 - 7.7 K/uL Final   Lymphocytes Relative 04/30/2023 27  %  Final   Lymphs Abs 04/30/2023 2.0  0.7 - 4.0 K/uL Final   Monocytes Relative 04/30/2023 8  % Final   Monocytes Absolute 04/30/2023 0.6  0.1 - 1.0 K/uL Final   Eosinophils Relative 04/30/2023 1  % Final   Eosinophils Absolute 04/30/2023 0.1  0.0 - 0.5 K/uL Final   Basophils Relative 04/30/2023 0  % Final   Basophils Absolute 04/30/2023 0.0  0.0 - 0.1 K/uL Final   Immature Granulocytes 04/30/2023 0  % Final   Abs Immature Granulocytes 04/30/2023 0.02  0.00 - 0.07 K/uL Final   Performed at [facility], [address], [place], [place] [address]   Sodium 04/30/2023 137  135 - 145 mmol/L Final   Potassium 04/30/2023 3.6  3.5 - 5.1 mmol/L Final   Chloride 04/30/2023 107  98 - 111 mmol/L Final   CO2 04/30/2023 23  22 - 32 mmol/L Final   Glucose, Bld 04/30/2023 107 (H)  70 - 99  mg/dL Final   Glucose reference range applies only to samples taken after fasting for at least 8 hours.   BUN 04/30/2023 6  6 - 20 mg/dL Final   Creatinine, Ser 04/30/2023 0.66  0.44 - 1.00 mg/dL Final   Calcium  04/30/2023 8.9  8.9 - 10.3 mg/dL Final   Total Protein 96/74/7974 7.0  6.5 - 8.1 g/dL Final   Albumin 96/74/7974 3.9  3.5 - 5.0 g/dL Final   AST 96/74/7974 14 (L)  15 - 41 U/L Final   ALT 04/30/2023 18  0 - 44 U/L Final   Alkaline Phosphatase 04/30/2023 68  38 - 126 U/L Final   Total Bilirubin 04/30/2023 0.4  0.0 - 1.2 mg/dL Final   GFR, Estimated 04/30/2023 >60  >60 mL/min Final   Comment: (NOTE) Calculated using the CKD-EPI Creatinine Equation (2021)    Anion gap 04/30/2023 7  5 - 15 Final   Performed at [facility], [address] [place], [place] [address]   Preg, Serum 04/30/2023 NEGATIVE  NEGATIVE Final   Comment:        THE SENSITIVITY OF THIS METHODOLOGY IS >10 mIU/mL. Performed at [facility], [address], [place], [place] [address]   Admission on 04/26/2023, Discharged on 04/27/2023  Component Date Value Ref Range Status   Sodium 04/26/2023 137  135 - 145 mmol/L Final   Potassium 04/26/2023 3.6  3.5 - 5.1 mmol/L Final   Chloride 04/26/2023 104  98 - 111 mmol/L Final   CO2 04/26/2023 23  22 - 32 mmol/L Final   Glucose, Bld 04/26/2023 96  70 - 99 mg/dL Final   Glucose reference range applies only to samples taken after fasting for at least 8 hours.   BUN 04/26/2023 8  6 - 20 mg/dL Final   Creatinine, Ser 04/26/2023 0.72  0.44 - 1.00 mg/dL Final   Calcium  04/26/2023 9.2  8.9 - 10.3 mg/dL Final   Total Protein 96/78/7974 7.0  6.5 - 8.1 g/dL Final   Albumin 96/78/7974 4.0  3.5 - 5.0 g/dL Final   AST 96/78/7974 20  15 - 41 U/L Final   ALT 04/26/2023 21  0 - 44 U/L Final   Alkaline Phosphatase 04/26/2023 62  38 - 126 U/L Final   Total Bilirubin 04/26/2023 0.5  0.0 - 1.2 mg/dL Final   GFR, Estimated 04/26/2023 >60  >60  mL/min Final   Comment: (NOTE) Calculated using the CKD-EPI Creatinine Equation (2021)    Anion gap 04/26/2023 10  5 - 15 Final   Performed at [facility], [address], [place], [place] [address]   Alcohol , Ethyl (B) 04/26/2023 <10  <10 mg/dL Final   Comment: (NOTE) Lowest detectable limit for serum alcohol  is 10 mg/dL.  For medical purposes only. Performed at [facility], [address] [place], [place] [address]    Salicylate Lvl 04/26/2023 <7.0 (L)  7.0 - 30.0 mg/dL Final   Performed at [facility], [address] [place], [place] [address]   Acetaminophen  (Tylenol ), Serum 04/26/2023 <10 (L)  10 - 30 ug/mL Final   Comment: (NOTE) Therapeutic concentrations vary significantly. A range of 10-30 ug/mL  may be an effective concentration for many patients. However, some  are best treated at concentrations outside of this range. Acetaminophen  concentrations >150 ug/mL at 4 hours after ingestion  and >50 ug/mL at 12 hours after ingestion are often associated with  toxic reactions.  Performed at [facility], [address], [place], [place] [address]    WBC 04/26/2023 7.4  4.0 - 10.5 K/uL Final   RBC 04/26/2023 4.61  3.87 - 5.11 MIL/uL Final   Hemoglobin 04/26/2023 11.5 (L)  12.0 - 15.0 g/dL Final   HCT 96/78/7974 36.7  36.0 - 46.0 % Final   MCV 04/26/2023 79.6 (L)  80.0 - 100.0 fL Final   MCH 04/26/2023 24.9 (L)  26.0 - 34.0 pg Final   MCHC 04/26/2023 31.3  30.0 - 36.0 g/dL Final   RDW 96/78/7974 15.3  11.5 - 15.5 % Final   Platelets 04/26/2023 277  150 - 400 K/uL Final   nRBC 04/26/2023 0.0  0.0 - 0.2 % Final   Performed at [facility], [address] [place], [place] [address]   Preg, Serum 04/26/2023 NEGATIVE  NEGATIVE Final   Comment:        THE SENSITIVITY OF THIS METHODOLOGY IS >10 mIU/mL. Performed at [facility], [address], [place], [place] [address]   Admission on 04/25/2023, Discharged on 04/26/2023  Component  Date Value Ref Range Status   WBC 04/26/2023 9.8  4.0 - 10.5 K/uL Final   RBC 04/26/2023 4.11  3.87 - 5.11 MIL/uL Final   Hemoglobin 04/26/2023 10.3 (L)  12.0 - 15.0 g/dL Final   HCT 96/78/7974 32.6 (L)  36.0 - 46.0 % Final   MCV 04/26/2023 79.3 (L)  80.0 - 100.0 fL Final   MCH 04/26/2023 25.1 (L)  26.0 - 34.0 pg Final   MCHC 04/26/2023 31.6  30.0 - 36.0 g/dL Final   RDW 96/78/7974 15.2  11.5 - 15.5 % Final   Platelets 04/26/2023 238  150 - 400 K/uL Final   nRBC 04/26/2023 0.0  0.0 - 0.2 % Final   Neutrophils Relative % 04/26/2023 66  % Final   Neutro Abs 04/26/2023 6.4  1.7 - 7.7 K/uL Final   Lymphocytes Relative 04/26/2023 24  % Final   Lymphs Abs 04/26/2023 2.4  0.7 - 4.0 K/uL Final   Monocytes Relative 04/26/2023 8  % Final   Monocytes Absolute 04/26/2023 0.8  0.1 - 1.0 K/uL Final   Eosinophils Relative 04/26/2023 1  % Final   Eosinophils Absolute 04/26/2023 0.1  0.0 - 0.5 K/uL Final   Basophils Relative 04/26/2023 0  % Final   Basophils Absolute 04/26/2023 0.0  0.0 - 0.1 K/uL Final   Immature Granulocytes 04/26/2023 1  % Final   Abs Immature Granulocytes 04/26/2023 0.05  0.00 -  0.07 K/uL Final   Performed at [facility], [address] [place], [place] [address]   Sodium 04/26/2023 139  135 - 145 mmol/L Final   Potassium 04/26/2023 3.6  3.5 - 5.1 mmol/L Final   Chloride 04/26/2023 106  98 - 111 mmol/L Final   CO2 04/26/2023 26  22 - 32 mmol/L Final   Glucose, Bld 04/26/2023 103 (H)  70 - 99 mg/dL Final   Glucose reference range applies only to samples taken after fasting for at least 8 hours.   BUN 04/26/2023 9  6 - 20 mg/dL Final   Creatinine, Ser 04/26/2023 0.73  0.44 - 1.00 mg/dL Final   Calcium  04/26/2023 9.1  8.9 - 10.3 mg/dL Final   Total Protein 96/78/7974 6.2 (L)  6.5 - 8.1 g/dL Final   Albumin 96/78/7974 3.5  3.5 - 5.0 g/dL Final   AST 96/78/7974 18  15 - 41 U/L Final   ALT 04/26/2023 19  0 - 44 U/L Final   Alkaline Phosphatase 04/26/2023 58  38 - 126 U/L  Final   Total Bilirubin 04/26/2023 0.3  0.0 - 1.2 mg/dL Final   GFR, Estimated 04/26/2023 >60  >60 mL/min Final   Comment: (NOTE) Calculated using the CKD-EPI Creatinine Equation (2021)    Anion gap 04/26/2023 7  5 - 15 Final   Performed at [facility], [address], [place], [place] [address]   Alcohol , Ethyl (B) 04/26/2023 <10  <10 mg/dL Final   Comment: (NOTE) Lowest detectable limit for serum alcohol  is 10 mg/dL.  For medical purposes only. Performed at [facility], [address] [place], [place] [address]    Salicylate Lvl 04/26/2023 <7.0 (L)  7.0 - 30.0 mg/dL Final   Performed at [facility], [address], [place], [place] [address]   Acetaminophen  (Tylenol ), Serum 04/26/2023 <10 (L)  10 - 30 ug/mL Final   Comment: (NOTE) Therapeutic concentrations vary significantly. A range of 10-30 ug/mL  may be an effective concentration for many patients. However, some  are best treated at concentrations outside of this range. Acetaminophen  concentrations >150 ug/mL at 4 hours after ingestion  and >50 ug/mL at 12 hours after ingestion are often associated with  toxic reactions.  Performed at [facility], [address] [place], [place] [address]    Preg, Serum 04/26/2023 NEGATIVE  NEGATIVE Final   Comment:        THE SENSITIVITY OF THIS METHODOLOGY IS >10 mIU/mL. Performed at [facility], [address], [place], [place] [address]   Admission on 04/15/2023, Discharged on 04/17/2023  Component Date Value Ref Range Status   Alcohol , Ethyl (B) 04/15/2023 <10  <10 mg/dL Final   Comment: (NOTE) Lowest detectable limit for serum alcohol  is 10 mg/dL.  For medical purposes only. Performed at [facility], [address] [place], [place] [address]    WBC 04/15/2023 8.3  4.0 - 10.5 K/uL Final   RBC 04/15/2023 4.57  3.87 - 5.11 MIL/uL Final   Hemoglobin 04/15/2023 11.4 (L)  12.0 - 15.0 g/dL Final   HCT 96/89/7974 36.5  36.0  - 46.0 % Final   MCV 04/15/2023 79.9 (L)  80.0 - 100.0 fL Final   MCH 04/15/2023 24.9 (L)  26.0 - 34.0 pg Final   MCHC 04/15/2023 31.2  30.0 - 36.0 g/dL Final   RDW 96/89/7974 15.4  11.5 - 15.5 % Final   Platelets 04/15/2023 246  150 - 400 K/uL Final   nRBC 04/15/2023 0.0  0.0 - 0.2 % Final   Neutrophils Relative % 04/15/2023 72  % Final   Neutro Abs 04/15/2023 6.0  1.7 - 7.7 K/uL Final   Lymphocytes Relative 04/15/2023 20  % Final   Lymphs Abs 04/15/2023 1.6  0.7 - 4.0 K/uL Final   Monocytes Relative 04/15/2023 7  % Final   Monocytes Absolute 04/15/2023 0.6  0.1 - 1.0 K/uL Final   Eosinophils Relative 04/15/2023 1  % Final   Eosinophils Absolute 04/15/2023 0.1  0.0 - 0.5 K/uL Final   Basophils Relative 04/15/2023 0  % Final   Basophils Absolute 04/15/2023 0.0  0.0 - 0.1 K/uL Final   Immature Granulocytes 04/15/2023 0  % Final   Abs Immature Granulocytes 04/15/2023 0.03  0.00 - 0.07 K/uL Final   Performed at [facility], [address] [place], [place] [address]   Sodium 04/15/2023 139  135 - 145 mmol/L Final   Potassium 04/15/2023 3.3 (L)  3.5 - 5.1 mmol/L Final   Chloride 04/15/2023 103  98 - 111 mmol/L Final   CO2 04/15/2023 22  22 - 32 mmol/L Final   Glucose, Bld 04/15/2023 107 (H)  70 - 99 mg/dL Final   Glucose reference range applies only to samples taken after fasting for at least 8 hours.   BUN 04/15/2023 11  6 - 20 mg/dL Final   Creatinine, Ser 04/15/2023 0.65  0.44 - 1.00 mg/dL Final   Calcium  04/15/2023 8.9  8.9 - 10.3 mg/dL Final   GFR, Estimated 04/15/2023 >60  >60 mL/min Final   Comment: (NOTE) Calculated using the CKD-EPI Creatinine Equation (2021)    Anion gap 04/15/2023 14  5 - 15 Final   Performed at [facility], [address] [place], [place] [address]  Admission on 04/14/2023, Discharged on 04/14/2023  Component Date Value Ref Range Status   Troponin I (High Sensitivity) 04/14/2023 <2  <18 ng/L Final   Comment:  (NOTE) Elevated high sensitivity troponin I (hsTnI) values and significant  changes across serial measurements may suggest ACS but many other  chronic and acute conditions are known to elevate hsTnI results.  Refer to the Links section for chest pain algorithms and additional  guidance. Performed at [facility], [address] [place], [place] [address]    WBC 04/14/2023 11.8 (H)  4.0 - 10.5 K/uL Final   RBC 04/14/2023 4.97  3.87 - 5.11 MIL/uL Final   Hemoglobin 04/14/2023 12.3  12.0 - 15.0 g/dL Final   HCT 96/90/7974 40.0  36.0 - 46.0 % Final   MCV 04/14/2023 80.5  80.0 - 100.0 fL Final   MCH 04/14/2023 24.7 (L)  26.0 - 34.0 pg Final   MCHC 04/14/2023 30.8  30.0 - 36.0 g/dL Final   RDW 96/90/7974 15.3  11.5 - 15.5 % Final   Platelets 04/14/2023 368  150 - 400 K/uL Final   nRBC 04/14/2023 0.0  0.0 - 0.2 % Final   Neutrophils Relative % 04/14/2023 71  % Final   Neutro Abs 04/14/2023 8.3 (H)  1.7 - 7.7 K/uL Final   Lymphocytes Relative 04/14/2023 22  % Final   Lymphs Abs 04/14/2023 2.5  0.7 - 4.0 K/uL Final   Monocytes Relative 04/14/2023 7  % Final   Monocytes Absolute 04/14/2023 0.8  0.1 - 1.0 K/uL Final   Eosinophils Relative 04/14/2023 0  % Final   Eosinophils Absolute 04/14/2023 0.1  0.0 -  0.5 K/uL Final   Basophils Relative 04/14/2023 0  % Final   Basophils Absolute 04/14/2023 0.0  0.0 - 0.1 K/uL Final   Immature Granulocytes 04/14/2023 0  % Final   Abs Immature Granulocytes 04/14/2023 0.05  0.00 - 0.07 K/uL Final   Performed at [facility], [address] [place], [place] [address]   Sodium 04/14/2023 138  135 - 145 mmol/L Final   Potassium 04/14/2023 3.9  3.5 - 5.1 mmol/L Final   Chloride 04/14/2023 103  98 - 111 mmol/L Final   CO2 04/14/2023 22  22 - 32 mmol/L Final   Glucose, Bld 04/14/2023 99  70 - 99 mg/dL Final   Glucose reference range applies only to samples taken after fasting for at least 8 hours.   BUN 04/14/2023 13  6 - 20 mg/dL Final    Creatinine, Ser 04/14/2023 0.92  0.44 - 1.00 mg/dL Final   Calcium  04/14/2023 9.4  8.9 - 10.3 mg/dL Final   GFR, Estimated 04/14/2023 >60  >60 mL/min Final   Comment: (NOTE) Calculated using the CKD-EPI Creatinine Equation (2021)    Anion gap 04/14/2023 13  5 - 15 Final   Performed at [facility], [address], [place], [place] [address]   D-Dimer, Quant 04/14/2023 <0.27  0.00 - 0.50 ug/mL-FEU Final   Comment: (NOTE) At the manufacturer cut-off value of 0.5 g/mL FEU, this assay has a negative predictive value of 95-100%.This assay is intended for use in conjunction with a clinical pretest probability (PTP) assessment model to exclude pulmonary embolism (PE) and deep venous thrombosis (DVT) in outpatients suspected of PE or DVT. Results should be correlated with clinical presentation. Performed at [facility], [address], [place], [place] [address]    Troponin I (High Sensitivity) 04/14/2023 <2  <18 ng/L Final   Comment: (NOTE) Elevated high sensitivity troponin I (hsTnI) values and significant  changes across serial measurements may suggest ACS but many other  chronic and acute conditions are known to elevate hsTnI results.  Refer to the Links section for chest pain algorithms and additional  guidance. Performed at [facility], [address], [place], [place] [address]   There may be more visits with results that are not included.    Allergies: Bee venom, Coconut flavoring agent (non-screening), Fish allergy, Geodon  [ziprasidone  hcl], Haloperidol  and related, Lithobid [lithium ], Roxicodone  [oxycodone ], Seroquel  [quetiapine ], Shellfish allergy, Phenergan  [promethazine  hcl], Prilosec [omeprazole], Sulfa antibiotics, Tegretol  [carbamazepine ], Prozac  [fluoxetine ], Tape, and Tylenol  [acetaminophen ]  Medications:  PTA Medications  Medication Sig   risperiDONE  ER (UZEDY ) 200 MG/0.56ML SUSY Inject into the skin.   metoprolol  tartrate (LOPRESSOR ) 25 MG  tablet Take 25 mg by mouth 2 (two) times daily.   sertraline  (ZOLOFT ) 100 MG tablet Take 100 mg by mouth daily.   gabapentin  (NEURONTIN ) 600 MG tablet Take 600 mg by mouth 3 (three) times daily.   prazosin  (MINIPRESS ) 2 MG capsule Take 2 mg by mouth at bedtime.   pantoprazole  (PROTONIX ) 40 MG tablet Take 40 mg by mouth daily.   Melatonin 3 MG CAPS Take 3 mg by mouth at bedtime as needed (sleep).      Medical Decision Making  Observation unit    Recommendations  Based on my evaluation the patient does not appear to have an emergency medical condition.  Gaither Pouch, NP 09/25/23  8:10 PM

## 2023-09-25 NOTE — ED Notes (Signed)
 Patient presents to [facility] voluntarily via [employer] d/t SI thoughts without plan or intent. Patient A&Ox4. Denies intent/thoughts to harm others when asked. Denies A/VH. Patient denies any physical complaints when asked. No distress noted. Support and encouragement provided. Routine safety checks conducted according to facility protocol. Encouraged patient to notify staff if thoughts of harm toward self or others arise. Endorses safety. Patient verbalized understanding and agreement. Plan of care ongoing, no further concerns as of present. Patient expresses no other needs at this time.
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
## 2023-09-26 ENCOUNTER — Encounter (HOSPITAL_COMMUNITY): Payer: Self-pay

## 2023-09-26 DIAGNOSIS — F250 Schizoaffective disorder, bipolar type: Secondary | ICD-10-CM | POA: Diagnosis not present

## 2023-09-26 MED ORDER — MELATONIN 3 MG PO CAPS: 3.0000 mg | ORAL_CAPSULE | Freq: Every evening | ORAL | Status: DC | PRN

## 2023-09-26 MED ORDER — GABAPENTIN 300 MG PO CAPS
600.0000 mg | ORAL_CAPSULE | Freq: Two times a day (BID) | ORAL | Status: DC
  Administered 2023-09-26 – 2023-09-27 (×3): 600 mg via ORAL
  Filled 2023-09-26 (×5): qty 2

## 2023-09-26 MED ORDER — GABAPENTIN 300 MG PO CAPS
300.0000 mg | ORAL_CAPSULE | Freq: Every day | ORAL | Status: DC
  Administered 2023-09-26 – 2023-09-27 (×2): 300 mg via ORAL
  Filled 2023-09-26 (×2): qty 1

## 2023-09-26 MED ORDER — PRAZOSIN HCL 2 MG PO CAPS
2.0000 mg | ORAL_CAPSULE | Freq: Every day | ORAL | Status: DC
  Administered 2023-09-26 – 2023-09-27 (×2): 2 mg via ORAL
  Filled 2023-09-26 (×2): qty 1

## 2023-09-26 MED ORDER — MELATONIN 3 MG PO TABS
3.0000 mg | ORAL_TABLET | Freq: Every day | ORAL | Status: DC
  Administered 2023-09-26 – 2023-09-27 (×2): 3 mg via ORAL
  Filled 2023-09-26 (×3): qty 1

## 2023-09-26 MED ORDER — PANTOPRAZOLE SODIUM 40 MG PO TBEC
40.0000 mg | DELAYED_RELEASE_TABLET | Freq: Every day | ORAL | Status: DC
  Administered 2023-09-26 – 2023-09-27 (×2): 40 mg via ORAL
  Filled 2023-09-26 (×2): qty 1

## 2023-09-26 MED ORDER — SERTRALINE HCL 50 MG PO TABS
150.0000 mg | ORAL_TABLET | Freq: Every day | ORAL | Status: DC
  Administered 2023-09-26 – 2023-09-27 (×2): 150 mg via ORAL
  Filled 2023-09-26 (×2): qty 3

## 2023-09-26 MED ORDER — BENZTROPINE MESYLATE 0.5 MG PO TABS
0.5000 mg | ORAL_TABLET | Freq: Two times a day (BID) | ORAL | Status: DC
  Administered 2023-09-26 – 2023-09-27 (×4): 0.5 mg via ORAL
  Filled 2023-09-26 (×4): qty 1

## 2023-09-26 MED ORDER — RISPERIDONE 3 MG PO TABS
3.0000 mg | ORAL_TABLET | Freq: Two times a day (BID) | ORAL | Status: DC
  Administered 2023-09-26 – 2023-09-27 (×4): 3 mg via ORAL
  Filled 2023-09-26 (×4): qty 1

## 2023-09-26 NOTE — ED Notes (Signed)
 Patient resting with eyes closed. Respirations even and unlabored. No distress noted. Environment secured. Plan of care ongoing, no further concerns as of present.

## 2023-09-26 NOTE — ED Notes (Signed)
 Per Elenor Gentry at [facility] patient has been accepted at their facility for tomorrow, 8/22, after 0800. Report number is [phone] opt 2 with accepting provider being Millie Manners, MD.

## 2023-09-26 NOTE — ED Provider Notes (Incomplete)
 Behavioral Health Progress Note  Date and Time: 09/26/2023 9:45 AM Name: [name] MRN:  [id number]  Subjective:    [name] is a 33 year old woman with past history of Asperger syndrome, borderline personality disorder, schizoaffective disorder, and multiple previous suicide attempts who presented to [facility] due to her missing her medications. Per chart review, she was having suicidal thoughts last night. She endorsed these thoughts, but did not want to go into detail. Says she is still having these thoughts today. Denies having intent or plan. Endorses being off psych medications since Saturday night, which prompted her SI and low mood. Also described her family as depressing but did not go into further detail. Additionally endorses being tired and in pain due to her neuropathy, especially since being off of her meds. Denies any substance abuse and did not endorse AVH.   Diagnosis:  Final diagnoses:  Schizoaffective disorder, bipolar type (HCC)  Medication management    Total Time spent with patient: 15 minutes  Past Psychiatric History: Asperger syndrome, borderline personality disorder, schizoaffective disorder, and multiple previous suicide attempts Past Medical History: essential tremor, chronic constipation, acid reflux, seizures, asthma, sleep apnea, neuromuscular disorder, nromocytic anemia, paroxysmal atrial fibrillation Family History: father- asthma, mental illness, brother- PDD, seizures Social History: Is married to husband and has kids (did not disclose how many). 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     Pain Medications: See MAR Prescriptions: See MAR Over the Counter: See MAR History of alcohol  / drug use?: No history of alcohol  / drug abuse Longest period of sobriety (when/how long): NA Negative Consequences of Use:  (NA) Withdrawal Symptoms: None                    Sleep: Fair  Appetite:  Fair  Current Medications:   Current Facility-Administered Medications  Medication Dose Route Frequency Provider Last Rate Last Admin   alum & mag hydroxide-simeth (MAALOX/MYLANTA) 200-200-20 MG/5ML suspension 30 mL  30 mL Oral Q4H PRN Trudy Carwin, NP       OLANZapine  (ZYPREXA ) injection 10 mg  10 mg Intramuscular TID PRN Trudy Carwin, NP       OLANZapine  (ZYPREXA ) injection 5 mg  5 mg Intramuscular TID PRN Trudy Carwin, NP       OLANZapine  zydis (ZYPREXA ) disintegrating tablet 5 mg  5 mg Oral TID PRN Trudy Carwin, NP       Current Outpatient Medications  Medication Sig Dispense Refill   gabapentin  (NEURONTIN ) 300 MG capsule Take 300 mg by mouth daily in the afternoon.     gabapentin  (NEURONTIN ) 600 MG tablet Take 600 mg by mouth 2 (two) times daily.     pantoprazole  (PROTONIX ) 40 MG tablet Take 40 mg by mouth daily.     benztropine  (COGENTIN ) 0.5 MG tablet Take 0.5 mg by mouth 2 (two) times daily.     Melatonin 3 MG CAPS Take 3 mg by mouth at bedtime as needed (sleep).     prazosin  (MINIPRESS ) 2 MG capsule Take 2 mg by mouth at bedtime.     risperiDONE  (RISPERDAL ) 3 MG tablet Take 3 mg by mouth 2 (two) times daily.     sertraline  (ZOLOFT ) 100 MG tablet Take 150 mg by mouth daily. Pt takes 1.5 tablets daily (150 mg)      Labs  Lab Results:  Admission on 09/25/2023  Component Date Value Ref Range Status   POC Amphetamine UR 09/25/2023 None Detected  NONE DETECTED (Cut Off  Level 1000 ng/mL) Final   POC Secobarbital (BAR) 09/25/2023 None Detected  NONE DETECTED (Cut Off Level 300 ng/mL) Final   POC Buprenorphine (BUP) 09/25/2023 None Detected  NONE DETECTED (Cut Off Level 10 ng/mL) Final   POC Oxazepam (BZO) 09/25/2023 None Detected  NONE DETECTED (Cut Off Level 300 ng/mL) Final   POC Cocaine UR 09/25/2023 None Detected  NONE DETECTED (Cut Off Level 300 ng/mL) Final   POC Methamphetamine UR 09/25/2023 None Detected  NONE DETECTED (Cut Off Level 1000 ng/mL) Final   POC Morphine  09/25/2023 None Detected  NONE  DETECTED (Cut Off Level 300 ng/mL) Final   POC Methadone UR 09/25/2023 None Detected  NONE DETECTED (Cut Off Level 300 ng/mL) Final   POC Oxycodone  UR 09/25/2023 None Detected  NONE DETECTED (Cut Off Level 100 ng/mL) Final   POC Marijuana UR 09/25/2023 None Detected  NONE DETECTED (Cut Off Level 50 ng/mL) Final   Preg Test, Ur 09/25/2023 Negative  Negative Final  Admission on 07/31/2023, Discharged on 07/31/2023  Component Date Value Ref Range Status   WBC 07/31/2023 9.1  4.0 - 10.5 K/uL Final   RBC 07/31/2023 4.46  3.87 - 5.11 MIL/uL Final   Hemoglobin 07/31/2023 11.5 (L)  12.0 - 15.0 g/dL Final   HCT 93/74/7974 36.0  36.0 - 46.0 % Final   MCV 07/31/2023 80.7  80.0 - 100.0 fL Final   MCH 07/31/2023 25.8 (L)  26.0 - 34.0 pg Final   MCHC 07/31/2023 31.9  30.0 - 36.0 g/dL Final   RDW 93/74/7974 14.8  11.5 - 15.5 % Final   Platelets 07/31/2023 250  150 - 400 K/uL Final   nRBC 07/31/2023 0.0  0.0 - 0.2 % Final   Neutrophils Relative % 07/31/2023 67  % Final   Neutro Abs 07/31/2023 6.1  1.7 - 7.7 K/uL Final   Lymphocytes Relative 07/31/2023 25  % Final   Lymphs Abs 07/31/2023 2.3  0.7 - 4.0 K/uL Final   Monocytes Relative 07/31/2023 7  % Final   Monocytes Absolute 07/31/2023 0.6  0.1 - 1.0 K/uL Final   Eosinophils Relative 07/31/2023 1  % Final   Eosinophils Absolute 07/31/2023 0.1  0.0 - 0.5 K/uL Final   Basophils Relative 07/31/2023 0  % Final   Basophils Absolute 07/31/2023 0.0  0.0 - 0.1 K/uL Final   Immature Granulocytes 07/31/2023 0  % Final   Abs Immature Granulocytes 07/31/2023 0.02  0.00 - 0.07 K/uL Final   Performed at [facility], [address], [place], [place] [address]   Sodium 07/31/2023 138  135 - 145 mmol/L Final   Potassium 07/31/2023 3.8  3.5 - 5.1 mmol/L Final   Chloride 07/31/2023 105  98 - 111 mmol/L Final   CO2 07/31/2023 23  22 - 32 mmol/L Final   Glucose, Bld 07/31/2023 92  70 - 99 mg/dL Final   Glucose reference range applies only to samples taken after  fasting for at least 8 hours.   BUN 07/31/2023 12  6 - 20 mg/dL Final   Creatinine, Ser 07/31/2023 0.82  0.44 - 1.00 mg/dL Final   Calcium  07/31/2023 9.4  8.9 - 10.3 mg/dL Final   Total Protein 93/74/7974 6.8  6.5 - 8.1 g/dL Final   Albumin 93/74/7974 4.1  3.5 - 5.0 g/dL Final   AST 93/74/7974 17  15 - 41 U/L Final   ALT 07/31/2023 20  0 - 44 U/L Final   Alkaline Phosphatase 07/31/2023 58  38 - 126 U/L  Final   Total Bilirubin 07/31/2023 0.5  0.0 - 1.2 mg/dL Final   GFR, Estimated 07/31/2023 >60  >60 mL/min Final   Comment: (NOTE) Calculated using the CKD-EPI Creatinine Equation (2021)    Anion gap 07/31/2023 10  5 - 15 Final   Performed at [facility], [address] [place], [place] [address]   Preg Test, Ur 07/31/2023 Negative  Negative Final   POC Amphetamine UR 07/31/2023 None Detected  NONE DETECTED (Cut Off Level 1000 ng/mL) Final   POC Secobarbital (BAR) 07/31/2023 None Detected  NONE DETECTED (Cut Off Level 300 ng/mL) Final   POC Buprenorphine (BUP) 07/31/2023 None Detected  NONE DETECTED (Cut Off Level 10 ng/mL) Final   POC Oxazepam (BZO) 07/31/2023 None Detected  NONE DETECTED (Cut Off Level 300 ng/mL) Final   POC Cocaine UR 07/31/2023 None Detected  NONE DETECTED (Cut Off Level 300 ng/mL) Final   POC Methamphetamine UR 07/31/2023 None Detected  NONE DETECTED (Cut Off Level 1000 ng/mL) Final   POC Morphine  07/31/2023 None Detected  NONE DETECTED (Cut Off Level 300 ng/mL) Final   POC Methadone UR 07/31/2023 None Detected  NONE DETECTED (Cut Off Level 300 ng/mL) Final   POC Oxycodone  UR 07/31/2023 None Detected  NONE DETECTED (Cut Off Level 100 ng/mL) Final   POC Marijuana UR 07/31/2023 None Detected  NONE DETECTED (Cut Off Level 50 ng/mL) Final   SARSCOV2ONAVIRUS 2 AG 07/31/2023 NEGATIVE  NEGATIVE Final   Comment: (NOTE) SARS-CoV-2 antigen NOT DETECTED.   Negative results are presumptive.  Negative results do not preclude SARS-CoV-2 infection and should not be used as  the sole basis for treatment or other patient management decisions, including infection  control decisions, particularly in the presence of clinical signs and  symptoms consistent with COVID-19, or in those who have been in contact with the virus.  Negative results must be combined with clinical observations, patient history, and epidemiological information. The expected result is Negative.  Fact Sheet for Patients: https://www.jennings-kim.com/  Fact Sheet for Healthcare Providers: https://alexander-rogers.biz/  This test is not yet approved or cleared by the [place]  FDA and  has been authorized for detection and/or diagnosis of SARS-CoV-2 by FDA under an Emergency Use Authorization (EUA).  This EUA will remain in effect (meaning this test can be used) for the duration of  the COV                          ID-19 declaration under Section 564(b)(1) of the Act, 21 U.S.C. section 360bbb-3(b)(1), unless the authorization is terminated or revoked sooner.    Admission on 07/13/2023, Discharged on 07/17/2023  Component Date Value Ref Range Status   Sodium 07/13/2023 141  135 - 145 mmol/L Final   Potassium 07/13/2023 3.6  3.5 - 5.1 mmol/L Final   Chloride 07/13/2023 109  98 - 111 mmol/L Final   CO2 07/13/2023 22  22 - 32 mmol/L Final   Glucose, Bld 07/13/2023 110 (H)  70 - 99 mg/dL Final   Glucose reference range applies only to samples taken after fasting for at least 8 hours.   BUN 07/13/2023 <5 (L)  6 - 20 mg/dL Final   Creatinine, Ser 07/13/2023 0.68  0.44 - 1.00 mg/dL Final   Calcium  07/13/2023 9.1  8.9 - 10.3 mg/dL Final   Total Protein 93/92/7974 6.9  6.5 - 8.1 g/dL Final   Albumin 93/92/7974 3.7  3.5 - 5.0 g/dL Final   AST 93/92/7974  20  15 - 41 U/L Final   ALT 07/13/2023 18  0 - 44 U/L Final   Alkaline Phosphatase 07/13/2023 65  38 - 126 U/L Final   Total Bilirubin 07/13/2023 0.3  0.0 - 1.2 mg/dL Final   GFR, Estimated 07/13/2023 >60  >60 mL/min  Final   Comment: (NOTE) Calculated using the CKD-EPI Creatinine Equation (2021)    Anion gap 07/13/2023 10  5 - 15 Final   Performed at [facility], [address] [place], [place] [address]   Alcohol , Ethyl (B) 07/13/2023 <15  <15 mg/dL Final   Comment: (NOTE) For medical purposes only. Performed at [facility], [address], [place], [place] [address]    Salicylate Lvl 07/13/2023 <7.0 (L)  7.0 - 30.0 mg/dL Final   Performed at [facility], [address] [place], [place] [address]   WBC 07/13/2023 7.6  4.0 - 10.5 K/uL Final   RBC 07/13/2023 4.57  3.87 - 5.11 MIL/uL Final   Hemoglobin 07/13/2023 11.5 (L)  12.0 - 15.0 g/dL Final   HCT 93/92/7974 37.2  36.0 - 46.0 % Final   MCV 07/13/2023 81.4  80.0 - 100.0 fL Final   MCH 07/13/2023 25.2 (L)  26.0 - 34.0 pg Final   MCHC 07/13/2023 30.9  30.0 - 36.0 g/dL Final   RDW 93/92/7974 14.8  11.5 - 15.5 % Final   Platelets 07/13/2023 263  150 - 400 K/uL Final   nRBC 07/13/2023 0.0  0.0 - 0.2 % Final   Neutrophils Relative % 07/13/2023 59  % Final   Neutro Abs 07/13/2023 4.5  1.7 - 7.7 K/uL Final   Lymphocytes Relative 07/13/2023 33  % Final   Lymphs Abs 07/13/2023 2.5  0.7 - 4.0 K/uL Final   Monocytes Relative 07/13/2023 6  % Final   Monocytes Absolute 07/13/2023 0.5  0.1 - 1.0 K/uL Final   Eosinophils Relative 07/13/2023 1  % Final   Eosinophils Absolute 07/13/2023 0.1  0.0 - 0.5 K/uL Final   Basophils Relative 07/13/2023 1  % Final   Basophils Absolute 07/13/2023 0.1  0.0 - 0.1 K/uL Final   Immature Granulocytes 07/13/2023 0  % Final   Abs Immature Granulocytes 07/13/2023 0.03  0.00 - 0.07 K/uL Final   Performed at [facility], [address], [place], [place] [address]   Opiates 07/13/2023 NONE DETECTED  NONE DETECTED Final   Cocaine 07/13/2023 NONE DETECTED  NONE DETECTED Final   Benzodiazepines 07/13/2023 NONE DETECTED  NONE DETECTED Final   Amphetamines 07/13/2023 NONE DETECTED  NONE DETECTED Final    Tetrahydrocannabinol 07/13/2023 NONE DETECTED  NONE DETECTED Final   Barbiturates 07/13/2023 NONE DETECTED  NONE DETECTED Final   Comment: (NOTE) DRUG SCREEN FOR MEDICAL PURPOSES ONLY.  IF CONFIRMATION IS NEEDED FOR ANY PURPOSE, NOTIFY LAB WITHIN 5 DAYS.  LOWEST DETECTABLE LIMITS FOR URINE DRUG SCREEN Drug Class                     Cutoff (ng/mL) Amphetamine and metabolites    1000 Barbiturate and metabolites    200 Benzodiazepine                 200 Opiates and metabolites        300 Cocaine and metabolites        300 THC  50 Performed at [facility], [address], [place], [place] [address]    Preg, Serum 07/13/2023 NEGATIVE  NEGATIVE Final   Comment:        THE SENSITIVITY OF THIS METHODOLOGY IS >10 mIU/mL. Performed at [facility], [address], [place], [place] [address]    Color, Urine 07/13/2023 YELLOW  YELLOW Final   APPearance 07/13/2023 HAZY (A)  CLEAR Final   Specific Gravity, Urine 07/13/2023 1.016  1.005 - 1.030 Final   pH 07/13/2023 5.0  5.0 - 8.0 Final   Glucose, UA 07/13/2023 NEGATIVE  NEGATIVE mg/dL Final   Hgb urine dipstick 07/13/2023 NEGATIVE  NEGATIVE Final   Bilirubin Urine 07/13/2023 NEGATIVE  NEGATIVE Final   Ketones, ur 07/13/2023 NEGATIVE  NEGATIVE mg/dL Final   Protein, ur 93/92/7974 NEGATIVE  NEGATIVE mg/dL Final   Nitrite 93/92/7974 NEGATIVE  NEGATIVE Final   Leukocytes,Ua 07/13/2023 NEGATIVE  NEGATIVE Final   Performed at [facility], [address] [place], [place] [address]   Acetaminophen  (Tylenol ), Serum 07/13/2023 <10 (L)  10 - 30 ug/mL Final   Comment: (NOTE) Therapeutic concentrations vary significantly. A range of 10-30 ug/mL  may be an effective concentration for many patients. However, some  are best treated at concentrations outside of this range. Acetaminophen  concentrations >150 ug/mL at 4 hours after ingestion  and >50 ug/mL at 12 hours after ingestion are often associated  with  toxic reactions.  Performed at [facility], [address] [place], [place] [address]    Acetaminophen  (Tylenol ), Serum 07/13/2023 <10 (L)  10 - 30 ug/mL Final   Comment: (NOTE) Therapeutic concentrations vary significantly. A range of 10-30 ug/mL  may be an effective concentration for many patients. However, some  are best treated at concentrations outside of this range. Acetaminophen  concentrations >150 ug/mL at 4 hours after ingestion  and >50 ug/mL at 12 hours after ingestion are often associated with  toxic reactions.  Performed at [facility], [address] [place], [place] [address]    Magnesium  07/13/2023 1.5 (L)  1.7 - 2.4 mg/dL Final   Performed at [facility], [address] [place], [place] [address]   Lactic Acid, Venous 07/13/2023 2.1 (HH)  0.5 - 1.9 mmol/L Final   Comment: CRITICAL RESULT CALLED TO, READ BACK BY AND VERIFIED WITH WEBER MORRIS R RN @1348  07/13/23 GORMAN ANTES Performed at [facility], [address] [place], [place] [address]    Sodium 07/14/2023 137  135 - 145 mmol/L Final   Potassium 07/14/2023 3.5  3.5 - 5.1 mmol/L Final   Chloride 07/14/2023 108  98 - 111 mmol/L Final   CO2 07/14/2023 23  22 - 32 mmol/L Final   Glucose, Bld 07/14/2023 92  70 - 99 mg/dL Final   Glucose reference range applies only to samples taken after fasting for at least 8 hours.   BUN 07/14/2023 5 (L)  6 - 20 mg/dL Final   Creatinine, Ser 07/14/2023 0.70  0.44 - 1.00 mg/dL Final   Calcium  07/14/2023 8.2 (L)  8.9 - 10.3 mg/dL Final   Total Protein 93/91/7974 5.9 (L)  6.5 - 8.1 g/dL Final   Albumin 93/91/7974 3.1 (L)  3.5 - 5.0 g/dL Final   AST 93/91/7974 19  15 - 41 U/L Final   ALT 07/14/2023 18  0 - 44 U/L Final   Alkaline Phosphatase 07/14/2023 55  38 - 126 U/L Final   Total  Bilirubin 07/14/2023 0.4  0.0 - 1.2 mg/dL Final   GFR, Estimated 07/14/2023 >60  >60 mL/min Final   Comment: (NOTE) Calculated using the CKD-EPI Creatinine  Equation (2021)    Anion gap 07/14/2023 6  5 - 15 Final   Performed at [facility], [address] [place], [place] [address]   WBC 07/14/2023 6.9  4.0 - 10.5 K/uL Final   RBC 07/14/2023 4.14  3.87 - 5.11 MIL/uL Final   Hemoglobin 07/14/2023 10.7 (L)  12.0 - 15.0 g/dL Final   HCT 93/91/7974 33.6 (L)  36.0 - 46.0 % Final   MCV 07/14/2023 81.2  80.0 - 100.0 fL Final   MCH 07/14/2023 25.8 (L)  26.0 - 34.0 pg Final   MCHC 07/14/2023 31.8  30.0 - 36.0 g/dL Final   RDW 93/91/7974 14.6  11.5 - 15.5 % Final   Platelets 07/14/2023 253  150 - 400 K/uL Final   nRBC 07/14/2023 0.0  0.0 - 0.2 % Final   Performed at [facility], [address], [place], [place] [address]   Magnesium  07/14/2023 1.9  1.7 - 2.4 mg/dL Final   Performed at [facility], [address], [place], [place] [address]  Admission on 06/18/2023, Discharged on 06/18/2023  Component Date Value Ref Range Status   WBC 06/18/2023 8.5  4.0 - 10.5 K/uL Final   RBC 06/18/2023 4.31  3.87 - 5.11 MIL/uL Final   Hemoglobin 06/18/2023 10.8 (L)  12.0 - 15.0 g/dL Final   HCT 94/86/7974 35.1 (L)  36.0 - 46.0 % Final   MCV 06/18/2023 81.4  80.0 - 100.0 fL Final   MCH 06/18/2023 25.1 (L)  26.0 - 34.0 pg Final   MCHC 06/18/2023 30.8  30.0 - 36.0 g/dL Final   RDW 94/86/7974 15.0  11.5 - 15.5 % Final   Platelets 06/18/2023 255  150 - 400 K/uL Final   nRBC 06/18/2023 0.0  0.0 - 0.2 % Final   Performed at [facility], [address], [place], [place] [address]   Sodium 06/18/2023 138  135 - 145 mmol/L Final   Potassium 06/18/2023 3.8  3.5 - 5.1 mmol/L Final   Chloride 06/18/2023 106  98 - 111 mmol/L Final   CO2 06/18/2023 24  22 - 32 mmol/L Final   Glucose, Bld 06/18/2023 90  70 - 99 mg/dL Final   Glucose reference range applies only to samples taken after fasting for at least 8 hours.   BUN 06/18/2023 <5 (L)  6 - 20 mg/dL Final   Creatinine, Ser 06/18/2023 0.66  0.44 - 1.00 mg/dL Final   Calcium   06/18/2023 8.7 (L)  8.9 - 10.3 mg/dL Final   Total Protein 94/86/7974 6.4 (L)  6.5 - 8.1 g/dL Final   Albumin 94/86/7974 3.2 (L)  3.5 - 5.0 g/dL Final   AST 94/86/7974 20  15 - 41 U/L Final   ALT 06/18/2023 20  0 - 44 U/L Final   Alkaline Phosphatase 06/18/2023 67  38 - 126 U/L Final   Total Bilirubin 06/18/2023 0.5  0.0 - 1.2 mg/dL Final   GFR, Estimated 06/18/2023 >60  >60 mL/min Final   Comment: (NOTE) Calculated using the CKD-EPI Creatinine Equation (2021)    Anion gap 06/18/2023 8  5 - 15 Final   Performed at [facility], [address] [place], [place] [address]   Color, Urine 06/18/2023 YELLOW  YELLOW Final   APPearance 06/18/2023 HAZY (A)  CLEAR  Final   Specific Gravity, Urine 06/18/2023 1.017  1.005 - 1.030 Final   pH 06/18/2023 6.0  5.0 - 8.0 Final   Glucose, UA 06/18/2023 NEGATIVE  NEGATIVE mg/dL Final   Hgb urine dipstick 06/18/2023 NEGATIVE  NEGATIVE Final   Bilirubin Urine 06/18/2023 NEGATIVE  NEGATIVE Final   Ketones, ur 06/18/2023 NEGATIVE  NEGATIVE mg/dL Final   Protein, ur 94/86/7974 NEGATIVE  NEGATIVE mg/dL Final   Nitrite 94/86/7974 NEGATIVE  NEGATIVE Final   Leukocytes,Ua 06/18/2023 TRACE (A)  NEGATIVE Final   RBC / HPF 06/18/2023 0-5  0 - 5 RBC/hpf Final   WBC, UA 06/18/2023 11-20  0 - 5 WBC/hpf Final   Bacteria, UA 06/18/2023 RARE (A)  NONE SEEN Final   Squamous Epithelial / HPF 06/18/2023 6-10  0 - 5 /HPF Final   Mucus 06/18/2023 PRESENT   Final   Performed at [facility], [address] [place], [place] [address]   Opiates 06/18/2023 NONE DETECTED  NONE DETECTED Final   Cocaine 06/18/2023 NONE DETECTED  NONE DETECTED Final   Benzodiazepines 06/18/2023 NONE DETECTED  NONE DETECTED Final   Amphetamines 06/18/2023 NONE DETECTED  NONE DETECTED Final   Tetrahydrocannabinol 06/18/2023 NONE DETECTED  NONE DETECTED Final   Barbiturates 06/18/2023 NONE DETECTED  NONE DETECTED Final   Comment: (NOTE) DRUG SCREEN FOR MEDICAL  PURPOSES ONLY.  IF CONFIRMATION IS NEEDED FOR ANY PURPOSE, NOTIFY LAB WITHIN 5 DAYS.  LOWEST DETECTABLE LIMITS FOR URINE DRUG SCREEN Drug Class                     Cutoff (ng/mL) Amphetamine and metabolites    1000 Barbiturate and metabolites    200 Benzodiazepine                 200 Opiates and metabolites        300 Cocaine and metabolites        300 THC                            50 Performed at [facility], [address] [place], [place] [address]    Preg Test, Ur 06/18/2023 NEGATIVE  NEGATIVE Final   Comment:        THE SENSITIVITY OF THIS METHODOLOGY IS >25 mIU/mL. Performed at [facility], [address] [place], [place] [address]    Troponin I (High Sensitivity) 06/18/2023 <2  <18 ng/L Final   Comment: (NOTE) Elevated high sensitivity troponin I (hsTnI) values and significant  changes across serial measurements may suggest ACS but many other  chronic and acute conditions are known to elevate hsTnI results.  Refer to the Links section for chest pain algorithms and additional  guidance. Performed at [facility], [address] [place], [place] [address]    Acetaminophen  (Tylenol ), Serum 06/18/2023 <10 (L)  10 - 30 ug/mL Final   Comment: (NOTE) Therapeutic concentrations vary significantly. A range of 10-30 ug/mL  may be an effective concentration for many patients. However, some  are best treated at concentrations outside of this range. Acetaminophen  concentrations >150 ug/mL at 4 hours after ingestion  and >50 ug/mL at 12 hours after ingestion are often associated with  toxic reactions.  Performed at [facility], [address], [place], [place] [address]    Salicylate Lvl 06/18/2023 <7.0 (L)  7.0 - 30.0 mg/dL Final   Performed at [facility], [address], [place], [place] [address]   D-Dimer, Earleen 06/18/2023 <0.27  0.00 - 0.50 ug/mL-FEU Final   Comment:  (NOTE) At the manufacturer cut-off value of 0.5 g/mL FEU, this assay has a negative predictive value of 95-100%.This assay is intended for use in conjunction with a clinical pretest probability (PTP) assessment model to exclude pulmonary embolism (PE) and deep venous thrombosis (DVT) in outpatients suspected of PE or DVT. Results should be correlated with clinical presentation. Performed at [facility], [address] [place], [place] [address]   Admission on 06/17/2023, Discharged on 06/17/2023  Component Date Value Ref Range Status   Sodium 06/17/2023 138  135 - 145 mmol/L Final   Potassium 06/17/2023 3.6  3.5 - 5.1 mmol/L Final   Chloride 06/17/2023 102  98 - 111 mmol/L Final   CO2 06/17/2023 27  22 - 32 mmol/L Final   Glucose, Bld 06/17/2023 112 (H)  70 - 99 mg/dL Final   Glucose reference range applies only to samples taken after fasting for at least 8 hours.   BUN 06/17/2023 5 (L)  6 - 20 mg/dL Final   Creatinine, Ser 06/17/2023 0.77  0.44 - 1.00 mg/dL Final   Calcium  06/17/2023 9.1  8.9 - 10.3 mg/dL Final   Total Protein 94/87/7974 7.0  6.5 - 8.1 g/dL Final   Albumin 94/87/7974 3.7  3.5 - 5.0 g/dL Final   AST 94/87/7974 21  15 - 41 U/L Final   ALT 06/17/2023 22  0 - 44 U/L Final   Alkaline Phosphatase 06/17/2023 68  38 - 126 U/L Final   Total Bilirubin 06/17/2023 0.5  0.0 - 1.2 mg/dL Final   GFR, Estimated 06/17/2023 >60  >60 mL/min Final   Comment: (NOTE) Calculated using the CKD-EPI Creatinine Equation (2021)    Anion gap 06/17/2023 9  5 - 15 Final   Performed at [facility], [address], [place], [place] [address]   Alcohol , Ethyl (B) 06/17/2023 <15  <15 mg/dL Final   Comment: Please note change in reference range. (NOTE) For medical purposes only. Performed at [facility], [address], [place], [place] [address]    WBC 06/17/2023 9.6  4.0 - 10.5 K/uL Final   RBC 06/17/2023 4.66  3.87 - 5.11 MIL/uL Final   Hemoglobin  06/17/2023 11.6 (L)  12.0 - 15.0 g/dL Final   HCT 94/87/7974 37.4  36.0 - 46.0 % Final   MCV 06/17/2023 80.3  80.0 - 100.0 fL Final   MCH 06/17/2023 24.9 (L)  26.0 - 34.0 pg Final   MCHC 06/17/2023 31.0  30.0 - 36.0 g/dL Final   RDW 94/87/7974 14.9  11.5 - 15.5 % Final   Platelets 06/17/2023 275  150 - 400 K/uL Final   nRBC 06/17/2023 0.0  0.0 - 0.2 % Final   Performed at [facility], [address] [place], [place] [address]   Opiates 06/17/2023 NONE DETECTED  NONE DETECTED Final   Cocaine 06/17/2023 NONE DETECTED  NONE DETECTED Final   Benzodiazepines 06/17/2023 NONE DETECTED  NONE DETECTED Final   Amphetamines 06/17/2023 NONE DETECTED  NONE DETECTED Final   Tetrahydrocannabinol 06/17/2023 NONE DETECTED  NONE DETECTED Final   Barbiturates 06/17/2023 NONE DETECTED  NONE DETECTED Final   Comment: (NOTE) DRUG SCREEN FOR MEDICAL PURPOSES ONLY.  IF CONFIRMATION IS NEEDED FOR ANY PURPOSE, NOTIFY LAB WITHIN 5 DAYS.  LOWEST DETECTABLE LIMITS FOR URINE DRUG SCREEN Drug Class  Cutoff (ng/mL) Amphetamine and metabolites    1000 Barbiturate and metabolites    200 Benzodiazepine                 200 Opiates and metabolites        300 Cocaine and metabolites        300 THC                            50 Performed at [facility], [address] [place], [place] [address]    Preg, Serum 06/17/2023 NEGATIVE  NEGATIVE Final   Comment:        THE SENSITIVITY OF THIS METHODOLOGY IS >10 mIU/mL. Performed at [facility], [address] [place], [place] [address]    Troponin I (High Sensitivity) 06/17/2023 5  <18 ng/L Final   Comment: (NOTE) Elevated high sensitivity troponin I (hsTnI) values and significant  changes across serial measurements may suggest ACS but many other  chronic and acute conditions are known to elevate hsTnI results.  Refer to the Links section for chest pain algorithms and additional  guidance. Performed at [facility]  Lab, [address] [place], [place] [address]    Troponin I (High Sensitivity) 06/17/2023 3  <18 ng/L Final   Comment: (NOTE) Elevated high sensitivity troponin I (hsTnI) values and significant  changes across serial measurements may suggest ACS but many other  chronic and acute conditions are known to elevate hsTnI results.  Refer to the Links section for chest pain algorithms and additional  guidance. Performed at [facility], [address] [place], [place] [address]   Admission on 05/07/2023, Discharged on 05/07/2023  Component Date Value Ref Range Status   WBC 05/07/2023 8.0  4.0 - 10.5 K/uL Final   RBC 05/07/2023 4.53  3.87 - 5.11 MIL/uL Final   Hemoglobin 05/07/2023 11.3 (L)  12.0 - 15.0 g/dL Final   HCT 95/98/7974 36.5  36.0 - 46.0 % Final   MCV 05/07/2023 80.6  80.0 - 100.0 fL Final   MCH 05/07/2023 24.9 (L)  26.0 - 34.0 pg Final   MCHC 05/07/2023 31.0  30.0 - 36.0 g/dL Final   RDW 95/98/7974 14.9  11.5 - 15.5 % Final   Platelets 05/07/2023 288  150 - 400 K/uL Final   nRBC 05/07/2023 0.0  0.0 - 0.2 % Final   Performed at [facility], [address], [place], [place] [address]   Sodium 05/07/2023 136  135 - 145 mmol/L Final   Potassium 05/07/2023 3.9  3.5 - 5.1 mmol/L Final   Chloride 05/07/2023 107  98 - 111 mmol/L Final   CO2 05/07/2023 20 (L)  22 - 32 mmol/L Final   Glucose, Bld 05/07/2023 98  70 - 99 mg/dL Final   Glucose reference range applies only to samples taken after fasting for at least 8 hours.   BUN 05/07/2023 9  6 - 20 mg/dL Final   Creatinine, Ser 05/07/2023 0.74  0.44 - 1.00 mg/dL Final   Calcium  05/07/2023 9.0  8.9 - 10.3 mg/dL Final   GFR, Estimated 05/07/2023 >60  >60 mL/min Final   Comment: (NOTE) Calculated using the CKD-EPI Creatinine Equation (2021)    Anion gap 05/07/2023 9  5 - 15 Final   Performed at [facility], [address] [place], [place] [address]   Troponin I (High Sensitivity)  05/07/2023 <2  <18 ng/L  Final   Comment: (NOTE) Elevated high sensitivity troponin I (hsTnI) values and significant  changes across serial measurements may suggest ACS but many other  chronic and acute conditions are known to elevate hsTnI results.  Refer to the Links section for chest pain algorithms and additional  guidance. Performed at [facility], [address], [place], [place] [address]   Admission on 04/30/2023, Discharged on 05/01/2023  Component Date Value Ref Range Status   Alcohol , Ethyl (B) 04/30/2023 <10  <10 mg/dL Final   Comment: (NOTE) Lowest detectable limit for serum alcohol  is 10 mg/dL.  For medical purposes only. Performed at [facility], [address], [place], [place] [address]    Opiates 04/30/2023 NONE DETECTED  NONE DETECTED Final   Cocaine 04/30/2023 NONE DETECTED  NONE DETECTED Final   Benzodiazepines 04/30/2023 NONE DETECTED  NONE DETECTED Final   Amphetamines 04/30/2023 NONE DETECTED  NONE DETECTED Final   Tetrahydrocannabinol 04/30/2023 NONE DETECTED  NONE DETECTED Final   Barbiturates 04/30/2023 NONE DETECTED  NONE DETECTED Final   Comment: (NOTE) DRUG SCREEN FOR MEDICAL PURPOSES ONLY.  IF CONFIRMATION IS NEEDED FOR ANY PURPOSE, NOTIFY LAB WITHIN 5 DAYS.  LOWEST DETECTABLE LIMITS FOR URINE DRUG SCREEN Drug Class                     Cutoff (ng/mL) Amphetamine and metabolites    1000 Barbiturate and metabolites    200 Benzodiazepine                 200 Opiates and metabolites        300 Cocaine and metabolites        300 THC                            50 Performed at [facility], [address] [place], [place] [address]    WBC 04/30/2023 7.4  4.0 - 10.5 K/uL Final   RBC 04/30/2023 4.41  3.87 - 5.11 MIL/uL Final   Hemoglobin 04/30/2023 10.9 (L)  12.0 - 15.0 g/dL Final   HCT 96/74/7974 35.9 (L)  36.0 - 46.0 % Final   MCV 04/30/2023 81.4  80.0 - 100.0 fL Final   MCH 04/30/2023  24.7 (L)  26.0 - 34.0 pg Final   MCHC 04/30/2023 30.4  30.0 - 36.0 g/dL Final   RDW 96/74/7974 15.5  11.5 - 15.5 % Final   Platelets 04/30/2023 267  150 - 400 K/uL Final   nRBC 04/30/2023 0.0  0.0 - 0.2 % Final   Neutrophils Relative % 04/30/2023 64  % Final   Neutro Abs 04/30/2023 4.7  1.7 - 7.7 K/uL Final   Lymphocytes Relative 04/30/2023 27  % Final   Lymphs Abs 04/30/2023 2.0  0.7 - 4.0 K/uL Final   Monocytes Relative 04/30/2023 8  % Final   Monocytes Absolute 04/30/2023 0.6  0.1 - 1.0 K/uL Final   Eosinophils Relative 04/30/2023 1  % Final   Eosinophils Absolute 04/30/2023 0.1  0.0 - 0.5 K/uL Final   Basophils Relative 04/30/2023 0  % Final   Basophils Absolute 04/30/2023 0.0  0.0 - 0.1 K/uL Final   Immature Granulocytes 04/30/2023 0  % Final   Abs Immature Granulocytes 04/30/2023 0.02  0.00 - 0.07 K/uL Final   Performed at [facility], [address], [place], [place] [address]   Sodium 04/30/2023 137  135 - 145 mmol/L  Final   Potassium 04/30/2023 3.6  3.5 - 5.1 mmol/L Final   Chloride 04/30/2023 107  98 - 111 mmol/L Final   CO2 04/30/2023 23  22 - 32 mmol/L Final   Glucose, Bld 04/30/2023 107 (H)  70 - 99 mg/dL Final   Glucose reference range applies only to samples taken after fasting for at least 8 hours.   BUN 04/30/2023 6  6 - 20 mg/dL Final   Creatinine, Ser 04/30/2023 0.66  0.44 - 1.00 mg/dL Final   Calcium  04/30/2023 8.9  8.9 - 10.3 mg/dL Final   Total Protein 96/74/7974 7.0  6.5 - 8.1 g/dL Final   Albumin 96/74/7974 3.9  3.5 - 5.0 g/dL Final   AST 96/74/7974 14 (L)  15 - 41 U/L Final   ALT 04/30/2023 18  0 - 44 U/L Final   Alkaline Phosphatase 04/30/2023 68  38 - 126 U/L Final   Total Bilirubin 04/30/2023 0.4  0.0 - 1.2 mg/dL Final   GFR, Estimated 04/30/2023 >60  >60 mL/min Final   Comment: (NOTE) Calculated using the CKD-EPI Creatinine Equation (2021)    Anion gap 04/30/2023 7  5 - 15 Final   Performed at [facility], [address], [place], [place] [address]   Preg, Serum 04/30/2023 NEGATIVE  NEGATIVE Final   Comment:        THE SENSITIVITY OF THIS METHODOLOGY IS >10 mIU/mL. Performed at [facility], [address], [place], [place] [address]   Admission on 04/26/2023, Discharged on 04/27/2023  Component Date Value Ref Range Status   Sodium 04/26/2023 137  135 - 145 mmol/L Final   Potassium 04/26/2023 3.6  3.5 - 5.1 mmol/L Final   Chloride 04/26/2023 104  98 - 111 mmol/L Final   CO2 04/26/2023 23  22 - 32 mmol/L Final   Glucose, Bld 04/26/2023 96  70 - 99 mg/dL Final   Glucose reference range applies only to samples taken after fasting for at least 8 hours.   BUN 04/26/2023 8  6 - 20 mg/dL Final   Creatinine, Ser 04/26/2023 0.72  0.44 - 1.00 mg/dL Final   Calcium  04/26/2023 9.2  8.9 - 10.3 mg/dL Final   Total Protein 96/78/7974 7.0  6.5 - 8.1 g/dL Final   Albumin 96/78/7974 4.0  3.5 - 5.0 g/dL Final   AST 96/78/7974 20  15 - 41 U/L Final   ALT 04/26/2023 21  0 - 44 U/L Final   Alkaline Phosphatase 04/26/2023 62  38 - 126 U/L Final   Total Bilirubin 04/26/2023 0.5  0.0 - 1.2 mg/dL Final   GFR, Estimated 04/26/2023 >60  >60 mL/min Final   Comment: (NOTE) Calculated using the CKD-EPI Creatinine Equation (2021)    Anion gap 04/26/2023 10  5 - 15 Final   Performed at [facility], [address], [place], [place] [address]   Alcohol , Ethyl (B) 04/26/2023 <10  <10 mg/dL Final   Comment: (NOTE) Lowest detectable limit for serum alcohol  is 10 mg/dL.  For medical purposes only. Performed at [facility], [address], [place], [place] [address]    Salicylate Lvl 04/26/2023 <7.0 (L)  7.0 - 30.0 mg/dL Final   Performed at [facility], [address] [place], [place] [address]   Acetaminophen  (Tylenol ), Serum 04/26/2023 <10 (L)  10 - 30 ug/mL Final   Comment: (NOTE) Therapeutic concentrations vary significantly. A range of 10-30 ug/mL  may be an effective  concentration for many patients. However, some  are best treated at concentrations outside of this range. Acetaminophen  concentrations >150 ug/mL at 4 hours after ingestion  and >50 ug/mL at 12 hours after ingestion are often associated with  toxic reactions.  Performed at [facility], [address], [place], [place] [address]    WBC 04/26/2023 7.4  4.0 - 10.5 K/uL Final   RBC 04/26/2023 4.61  3.87 - 5.11 MIL/uL Final   Hemoglobin 04/26/2023 11.5 (L)  12.0 - 15.0 g/dL Final   HCT 96/78/7974 36.7  36.0 - 46.0 % Final   MCV 04/26/2023 79.6 (L)  80.0 - 100.0 fL Final   MCH 04/26/2023 24.9 (L)  26.0 - 34.0 pg Final   MCHC 04/26/2023 31.3  30.0 - 36.0 g/dL Final   RDW 96/78/7974 15.3  11.5 - 15.5 % Final   Platelets 04/26/2023 277  150 - 400 K/uL Final   nRBC 04/26/2023 0.0  0.0 - 0.2 % Final   Performed at [facility], [address] [place], [place] [address]   Preg, Serum 04/26/2023 NEGATIVE  NEGATIVE Final   Comment:        THE SENSITIVITY OF THIS METHODOLOGY IS >10 mIU/mL. Performed at [facility], [address], [place], [place] [address]   Admission on 04/25/2023, Discharged on 04/26/2023  Component Date Value Ref Range Status   WBC 04/26/2023 9.8  4.0 - 10.5 K/uL Final   RBC 04/26/2023 4.11  3.87 - 5.11 MIL/uL Final   Hemoglobin 04/26/2023 10.3 (L)  12.0 - 15.0 g/dL Final   HCT 96/78/7974 32.6 (L)  36.0 - 46.0 % Final   MCV 04/26/2023 79.3 (L)  80.0 - 100.0 fL Final   MCH 04/26/2023 25.1 (L)  26.0 - 34.0 pg Final   MCHC 04/26/2023 31.6  30.0 - 36.0 g/dL Final   RDW 96/78/7974 15.2  11.5 - 15.5 % Final   Platelets 04/26/2023 238  150 - 400 K/uL Final   nRBC 04/26/2023 0.0  0.0 - 0.2 % Final   Neutrophils Relative % 04/26/2023 66  % Final   Neutro Abs 04/26/2023 6.4  1.7 - 7.7 K/uL Final   Lymphocytes Relative 04/26/2023 24  % Final   Lymphs Abs 04/26/2023 2.4  0.7 - 4.0 K/uL Final   Monocytes Relative 04/26/2023 8  % Final   Monocytes Absolute  04/26/2023 0.8  0.1 - 1.0 K/uL Final   Eosinophils Relative 04/26/2023 1  % Final   Eosinophils Absolute 04/26/2023 0.1  0.0 - 0.5 K/uL Final   Basophils Relative 04/26/2023 0  % Final   Basophils Absolute 04/26/2023 0.0  0.0 - 0.1 K/uL Final   Immature Granulocytes 04/26/2023 1  % Final   Abs Immature Granulocytes 04/26/2023 0.05  0.00 - 0.07 K/uL Final   Performed at [facility], [address] [place], [place] [address]   Sodium 04/26/2023 139  135 - 145 mmol/L Final   Potassium 04/26/2023 3.6  3.5 - 5.1 mmol/L Final   Chloride 04/26/2023 106  98 - 111 mmol/L Final   CO2 04/26/2023 26  22 - 32 mmol/L Final   Glucose, Bld 04/26/2023 103 (H)  70 - 99 mg/dL Final   Glucose reference range applies only to samples taken after fasting for at least 8 hours.   BUN 04/26/2023 9  6 - 20 mg/dL Final   Creatinine, Ser 04/26/2023 0.73  0.44 - 1.00 mg/dL Final   Calcium  04/26/2023 9.1  8.9 - 10.3 mg/dL Final   Total Protein 96/78/7974 6.2 (L)  6.5 - 8.1 g/dL Final   Albumin 96/78/7974 3.5  3.5 - 5.0 g/dL Final   AST 96/78/7974 18  15 - 41 U/L Final   ALT 04/26/2023 19  0 - 44 U/L Final   Alkaline Phosphatase 04/26/2023 58  38 - 126 U/L Final   Total Bilirubin 04/26/2023 0.3  0.0 - 1.2 mg/dL Final   GFR, Estimated 04/26/2023 >60  >60 mL/min Final   Comment: (NOTE) Calculated using the CKD-EPI Creatinine Equation (2021)    Anion gap 04/26/2023 7  5 - 15 Final   Performed at [facility], [address] [place], [place] [address]   Alcohol , Ethyl (B) 04/26/2023 <10  <10 mg/dL Final   Comment: (NOTE) Lowest detectable limit for serum alcohol  is 10 mg/dL.  For medical purposes only. Performed at [facility], [address] [place], [place] [address]    Salicylate Lvl 04/26/2023 <7.0 (L)  7.0 - 30.0 mg/dL Final   Performed at [facility], [address], [place], [place] [address]   Acetaminophen  (Tylenol ), Serum 04/26/2023 <10 (L)  10 - 30 ug/mL Final   Comment:  (NOTE) Therapeutic concentrations vary significantly. A range of 10-30 ug/mL  may be an effective concentration for many patients. However, some  are best treated at concentrations outside of this range. Acetaminophen  concentrations >150 ug/mL at 4 hours after ingestion  and >50 ug/mL at 12 hours after ingestion are often associated with  toxic reactions.  Performed at [facility], [address], [place], [place] [address]    Preg, Serum 04/26/2023 NEGATIVE  NEGATIVE Final   Comment:        THE SENSITIVITY OF THIS METHODOLOGY IS >10 mIU/mL. Performed at [facility], [address], [place], [place] [address]   Admission on 04/15/2023, Discharged on 04/17/2023  Component Date Value Ref Range Status   Alcohol , Ethyl (B) 04/15/2023 <10  <10 mg/dL Final   Comment: (NOTE) Lowest detectable limit for serum alcohol  is 10 mg/dL.  For medical purposes only. Performed at [facility], [address] [place], [place] [address]    WBC 04/15/2023 8.3  4.0 - 10.5 K/uL Final   RBC 04/15/2023 4.57  3.87 - 5.11 MIL/uL Final   Hemoglobin 04/15/2023 11.4 (L)  12.0 - 15.0 g/dL Final   HCT 96/89/7974 36.5  36.0 - 46.0 % Final   MCV 04/15/2023 79.9 (L)  80.0 - 100.0 fL Final   MCH 04/15/2023 24.9 (L)  26.0 - 34.0 pg Final   MCHC 04/15/2023 31.2  30.0 - 36.0 g/dL Final   RDW 96/89/7974 15.4  11.5 - 15.5 % Final   Platelets 04/15/2023 246  150 - 400 K/uL Final   nRBC 04/15/2023 0.0  0.0 - 0.2 % Final   Neutrophils Relative % 04/15/2023 72  % Final   Neutro Abs 04/15/2023 6.0  1.7 - 7.7 K/uL Final   Lymphocytes Relative 04/15/2023 20  % Final   Lymphs Abs 04/15/2023 1.6  0.7 - 4.0 K/uL Final   Monocytes Relative 04/15/2023 7  % Final   Monocytes Absolute 04/15/2023 0.6  0.1 - 1.0 K/uL Final   Eosinophils Relative 04/15/2023 1  % Final   Eosinophils Absolute 04/15/2023 0.1  0.0 - 0.5 K/uL Final   Basophils Relative 04/15/2023 0  % Final   Basophils Absolute  04/15/2023 0.0  0.0 - 0.1 K/uL  Final   Immature Granulocytes 04/15/2023 0  % Final   Abs Immature Granulocytes 04/15/2023 0.03  0.00 - 0.07 K/uL Final   Performed at [facility], [address] [place], [place] [address]   Sodium 04/15/2023 139  135 - 145 mmol/L Final   Potassium 04/15/2023 3.3 (L)  3.5 - 5.1 mmol/L Final   Chloride 04/15/2023 103  98 - 111 mmol/L Final   CO2 04/15/2023 22  22 - 32 mmol/L Final   Glucose, Bld 04/15/2023 107 (H)  70 - 99 mg/dL Final   Glucose reference range applies only to samples taken after fasting for at least 8 hours.   BUN 04/15/2023 11  6 - 20 mg/dL Final   Creatinine, Ser 04/15/2023 0.65  0.44 - 1.00 mg/dL Final   Calcium  04/15/2023 8.9  8.9 - 10.3 mg/dL Final   GFR, Estimated 04/15/2023 >60  >60 mL/min Final   Comment: (NOTE) Calculated using the CKD-EPI Creatinine Equation (2021)    Anion gap 04/15/2023 14  5 - 15 Final   Performed at [facility], [address] [place], [place] [address]  There may be more visits with results that are not included.    Blood Alcohol  level:  Lab Results  Component Value Date   [facility] <15 07/13/2023   ETH <15 06/17/2023    Metabolic Disorder Labs: Lab Results  Component Value Date   HGBA1C 5.4 10/22/2022   MPG 108 10/22/2022   MPG 103 04/20/2022   Lab Results  Component Value Date   PROLACTIN 20.0 12/14/2022   PROLACTIN 66.2 (H) 03/02/2021   Lab Results  Component Value Date   CHOL 188 12/14/2022   TRIG 222 (H) 12/14/2022   HDL 41 12/14/2022   CHOLHDL 4.6 12/14/2022   VLDL 44 (H) 12/14/2022   LDLCALC 103 (H) 12/14/2022   LDLCALC 117 (H) 10/22/2022    Therapeutic Lab Levels: No results found for: LITHIUM  Lab Results  Component Value Date   VALPROATE 48 (L) 07/10/2015   VALPROATE 84 05/31/2015   No results found for: CBMZ  Physical Findings   AIMS    Flowsheet Row [facility]) [facility] Admission (Discharged) from [facility]B Admission (Discharged) from [facility]B Admission (Discharged) from [facility] 300B Admission (Discharged) from [facility]B  AIMS Total Score 0 0 0 0 0   AUDIT    Flowsheet Row ED to Hosp-Admission (Discharged) [facility]B ED to Hosp-Admission (Discharged) from 10[facility]22 [facility] 400B Admission (Discharged) from 07[facility] 400B Admission (Discharged) from 07[facility]06[facility]2022 [facility] 400B Admission (Discharged) from [facility]07[facility] 400B  Alcohol  Use Disorder Identification Test Final Score (AUDIT) 0 0 0 0 0   GAD-7    Flowsheet Row Office Visit from 08/20/2016 in [facility] for [facility] from 04/03/2016 in [facility] Routine Prenatal from 03/14/2016 in [facility] for [facility] Routine Prenatal from 03/08/2016 in Center for [facility] Routine Prenatal from 03/01/2016 in Center for [facility]  Total GAD-7 Score 1 19 13 9 5    PHQ2-9    Flowsheet Row ED from 03/10/2022 in [facility]  ED from 02/10/2022 in [facility] ED from 06/26/2021 in [facility] ED from 05/01/2021 in [facility] ED from 02/03/2020 in [facility]  PHQ-2 Total Score 2 4 3 5 6   PHQ-9 Total Score 9 16 9 17 27    Flowsheet Row ED from 09/25/2023 in [facility] ED from 07/31/2023 in [facility] ED to Hosp-Admission (Discharged) from 07/13/2023 in [facility]  C-SSRS RISK CATEGORY Low Risk High Risk High Risk     Musculoskeletal  Strength & Muscle Tone: Did not assess Gait & Station: Did not assess Patient leans:Did not assess   Psychiatric Specialty Exam  Presentation  General Appearance:  Casual  Eye Contact: Fair   Speech: Mumbles at times   Speech Volume: Low   Handedness: Right   Mood and Affect  Mood: Anxious  Affect: Congruent   Thought Process  Thought Processes: Coherent  Descriptions of Associations:Intact  Orientation:Full (Time, Place and Person)  Thought Content:WDL  Diagnosis of Schizophrenia or Schizoaffective disorder in past: Yes  Duration of Psychotic Symptoms: Greater than six months   Hallucinations:Hallucinations: None  Ideas of Reference:None  Suicidal Thoughts:Suicidal Thoughts: Yes, Passive SI Passive Intent and/or Plan: Without Intent; Without Plan  Homicidal Thoughts:Homicidal Thoughts: No   Sensorium  Memory: Immediate Fair  Judgment: Fair  Insight: Fair   Executive Functions  Concentration: Good  Attention Span: Good  Recall: Good  Fund of Knowledge: Good  Language: Good   Psychomotor Activity  Psychomotor Activity: Psychomotor Activity: Normal   Assets  Assets: Desire for Improvement   Sleep  Sleep: Sleep: Fair  No Safety Checks orders active in given range  Nutritional Assessment (For OBS and FBC admissions only) Has the patient had a weight loss or gain of 10 pounds or more in the last 3 months?: No Has the patient had a decrease in food intake/or appetite?: No Does the patient have dental problems?: No Does the patient have eating habits or behaviors that may be indicators of an eating disorder including binging or inducing vomiting?: No Has the patient recently lost weight without trying?: 0 Has the patient been eating poorly because of a decreased  appetite?: 0 Malnutrition Screening Tool Score: 0    Physical Exam  Physical Exam Constitutional:      Appearance: Normal appearance.  Cardiovascular:     Rate and Rhythm: Normal rate.  Pulmonary:     Effort: Pulmonary effort is normal.  Neurological:     General: No focal deficit present.     Blood pressure 120/64, pulse 98, temperature 98.6 F (37 C), temperature source Oral, resp. rate 18, SpO2 98%. There is no height or weight on file to calculate BMI.  Treatment Plan Summary: Patient will go to inpatient psych hospital due to current SI, depressive symptoms, and multiple previous suicide attempts and psych hospitalizations. Also contacted her ACT team, who recommend inpatient as well. Patient is agreeable, but may have to IVC due to previous history of unsafe transportation.   Cozetta JINNY Pereyra, Medical Student 09/26/2023 9:45 AM

## 2023-09-26 NOTE — ED Provider Notes (Cosign Needed Addendum)
 Behavioral Health Progress Note  Date and Time: 09/26/2023 3:29 PM Name: [name] MRN:  [id number]  Subjective:    [name] is a 33 year old woman with past history of Asperger syndrome, borderline personality disorder, schizoaffective disorder, and multiple previous suicide attempts who presented to [facility] due to her missing her medications. Per chart review, she was having suicidal thoughts last night. She endorsed these thoughts, but did not want to go into detail. Says she is still having these thoughts today. Denies having intent or plan. Endorses being off psych medications since Saturday night, which prompted her SI and low mood. Also described her family as depressing but did not go into further detail. Additionally endorses being tired and in pain due to her neuropathy, especially since being off of her meds. Denies any substance abuse and did not endorse AVH.   Diagnosis:  Final diagnoses:  Schizoaffective disorder, bipolar type (HCC)  Medication management    Total Time spent with patient: 15 minutes  Past Psychiatric History: Asperger syndrome, borderline personality disorder, schizoaffective disorder, and multiple previous suicide attempts Past Medical History: essential tremor, chronic constipation, acid reflux, seizures, asthma, sleep apnea, neuromuscular disorder, nromocytic anemia, paroxysmal atrial fibrillation Family History: father- asthma, mental illness, brother- PDD, seizures Social History: Is married to husband and has kids (did not disclose how many). 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   Sleep: Fair  Appetite:  Fair  Current Medications:  Current Facility-Administered Medications  Medication Dose Route Frequency Provider Last Rate Last Admin   alum & mag hydroxide-simeth (MAALOX/MYLANTA) 200-200-20 MG/5ML suspension 30 mL  30 mL Oral Q4H PRN Trudy Carwin, NP       benztropine  (COGENTIN ) tablet 0.5 mg  0.5 mg Oral BID  Loralie Malta, MD       gabapentin  (NEURONTIN ) capsule 300 mg  300 mg Oral Q1500 Afra Tricarico, MD       gabapentin  (NEURONTIN ) capsule 600 mg  600 mg Oral BID Michal Callicott, MD       melatonin tablet 3 mg  3 mg Oral QHS Dailan Pfalzgraf, MD       OLANZapine  (ZYPREXA ) injection 10 mg  10 mg Intramuscular TID PRN Trudy Carwin, NP       OLANZapine  (ZYPREXA ) injection 5 mg  5 mg Intramuscular TID PRN Trudy Carwin, NP       OLANZapine  zydis (ZYPREXA ) disintegrating tablet 5 mg  5 mg Oral TID PRN Trudy Carwin, NP       pantoprazole  (PROTONIX ) EC tablet 40 mg  40 mg Oral Daily Aijalon Kirtz, MD       prazosin  (MINIPRESS ) capsule 2 mg  2 mg Oral QHS Shakeeta Godette, MD       risperiDONE  (RISPERDAL ) tablet 3 mg  3 mg Oral BID Auburn Hert, MD       sertraline  (ZOLOFT ) tablet 150 mg  150 mg Oral Daily Carita Sollars, MD       Current Outpatient Medications  Medication Sig Dispense Refill   gabapentin  (NEURONTIN ) 300 MG capsule Take 300 mg by mouth daily in the afternoon.     gabapentin  (NEURONTIN ) 600 MG tablet Take 600 mg by mouth 2 (two) times daily.     pantoprazole  (PROTONIX ) 40 MG tablet Take 40 mg by mouth daily.     benztropine  (COGENTIN ) 0.5 MG tablet Take 0.5 mg by mouth 2 (two) times daily.     Melatonin 3 MG CAPS Take 3 mg by mouth at bedtime as needed (  sleep).     prazosin  (MINIPRESS ) 2 MG capsule Take 2 mg by mouth at bedtime.     risperiDONE  (RISPERDAL ) 3 MG tablet Take 3 mg by mouth 2 (two) times daily.     sertraline  (ZOLOFT ) 100 MG tablet Take 150 mg by mouth daily. Pt takes 1.5 tablets daily (150 mg)      Labs  Lab Results:  Admission on 09/25/2023  Component Date Value Ref Range Status   POC Amphetamine UR 09/25/2023 None Detected  NONE DETECTED (Cut Off Level 1000 ng/mL) Final   POC Secobarbital (BAR) 09/25/2023 None Detected  NONE DETECTED (Cut Off Level 300 ng/mL) Final   POC Buprenorphine (BUP) 09/25/2023 None Detected  NONE DETECTED (Cut Off Level 10 ng/mL) Final    POC Oxazepam (BZO) 09/25/2023 None Detected  NONE DETECTED (Cut Off Level 300 ng/mL) Final   POC Cocaine UR 09/25/2023 None Detected  NONE DETECTED (Cut Off Level 300 ng/mL) Final   POC Methamphetamine UR 09/25/2023 None Detected  NONE DETECTED (Cut Off Level 1000 ng/mL) Final   POC Morphine  09/25/2023 None Detected  NONE DETECTED (Cut Off Level 300 ng/mL) Final   POC Methadone UR 09/25/2023 None Detected  NONE DETECTED (Cut Off Level 300 ng/mL) Final   POC Oxycodone  UR 09/25/2023 None Detected  NONE DETECTED (Cut Off Level 100 ng/mL) Final   POC Marijuana UR 09/25/2023 None Detected  NONE DETECTED (Cut Off Level 50 ng/mL) Final   Preg Test, Ur 09/25/2023 Negative  Negative Final  Admission on 07/31/2023, Discharged on 07/31/2023  Component Date Value Ref Range Status   WBC 07/31/2023 9.1  4.0 - 10.5 K/uL Final   RBC 07/31/2023 4.46  3.87 - 5.11 MIL/uL Final   Hemoglobin 07/31/2023 11.5 (L)  12.0 - 15.0 g/dL Final   HCT 93/74/7974 36.0  36.0 - 46.0 % Final   MCV 07/31/2023 80.7  80.0 - 100.0 fL Final   MCH 07/31/2023 25.8 (L)  26.0 - 34.0 pg Final   MCHC 07/31/2023 31.9  30.0 - 36.0 g/dL Final   RDW 93/74/7974 14.8  11.5 - 15.5 % Final   Platelets 07/31/2023 250  150 - 400 K/uL Final   nRBC 07/31/2023 0.0  0.0 - 0.2 % Final   Neutrophils Relative % 07/31/2023 67  % Final   Neutro Abs 07/31/2023 6.1  1.7 - 7.7 K/uL Final   Lymphocytes Relative 07/31/2023 25  % Final   Lymphs Abs 07/31/2023 2.3  0.7 - 4.0 K/uL Final   Monocytes Relative 07/31/2023 7  % Final   Monocytes Absolute 07/31/2023 0.6  0.1 - 1.0 K/uL Final   Eosinophils Relative 07/31/2023 1  % Final   Eosinophils Absolute 07/31/2023 0.1  0.0 - 0.5 K/uL Final   Basophils Relative 07/31/2023 0  % Final   Basophils Absolute 07/31/2023 0.0  0.0 - 0.1 K/uL Final   Immature Granulocytes 07/31/2023 0  % Final   Abs Immature Granulocytes 07/31/2023 0.02  0.00 - 0.07 K/uL Final   Performed at [facility], [address],  [place], [place] [address]   Sodium 07/31/2023 138  135 - 145 mmol/L Final   Potassium 07/31/2023 3.8  3.5 - 5.1 mmol/L Final   Chloride 07/31/2023 105  98 - 111 mmol/L Final   CO2 07/31/2023 23  22 - 32 mmol/L Final   Glucose, Bld 07/31/2023 92  70 - 99 mg/dL Final   Glucose reference range applies only to samples taken after fasting for at least 8 hours.  BUN 07/31/2023 12  6 - 20 mg/dL Final   Creatinine, Ser 07/31/2023 0.82  0.44 - 1.00 mg/dL Final   Calcium  07/31/2023 9.4  8.9 - 10.3 mg/dL Final   Total Protein 93/74/7974 6.8  6.5 - 8.1 g/dL Final   Albumin 93/74/7974 4.1  3.5 - 5.0 g/dL Final   AST 93/74/7974 17  15 - 41 U/L Final   ALT 07/31/2023 20  0 - 44 U/L Final   Alkaline Phosphatase 07/31/2023 58  38 - 126 U/L Final   Total Bilirubin 07/31/2023 0.5  0.0 - 1.2 mg/dL Final   GFR, Estimated 07/31/2023 >60  >60 mL/min Final   Comment: (NOTE) Calculated using the CKD-EPI Creatinine Equation (2021)    Anion gap 07/31/2023 10  5 - 15 Final   Performed at [facility], [address] [place], [place] [address]   Preg Test, Ur 07/31/2023 Negative  Negative Final   POC Amphetamine UR 07/31/2023 None Detected  NONE DETECTED (Cut Off Level 1000 ng/mL) Final   POC Secobarbital (BAR) 07/31/2023 None Detected  NONE DETECTED (Cut Off Level 300 ng/mL) Final   POC Buprenorphine (BUP) 07/31/2023 None Detected  NONE DETECTED (Cut Off Level 10 ng/mL) Final   POC Oxazepam (BZO) 07/31/2023 None Detected  NONE DETECTED (Cut Off Level 300 ng/mL) Final   POC Cocaine UR 07/31/2023 None Detected  NONE DETECTED (Cut Off Level 300 ng/mL) Final   POC Methamphetamine UR 07/31/2023 None Detected  NONE DETECTED (Cut Off Level 1000 ng/mL) Final   POC Morphine  07/31/2023 None Detected  NONE DETECTED (Cut Off Level 300 ng/mL) Final   POC Methadone UR 07/31/2023 None Detected  NONE DETECTED (Cut Off Level 300 ng/mL) Final   POC Oxycodone  UR 07/31/2023 None Detected  NONE DETECTED (Cut Off Level 100 ng/mL)  Final   POC Marijuana UR 07/31/2023 None Detected  NONE DETECTED (Cut Off Level 50 ng/mL) Final   SARSCOV2ONAVIRUS 2 AG 07/31/2023 NEGATIVE  NEGATIVE Final   Comment: (NOTE) SARS-CoV-2 antigen NOT DETECTED.   Negative results are presumptive.  Negative results do not preclude SARS-CoV-2 infection and should not be used as the sole basis for treatment or other patient management decisions, including infection  control decisions, particularly in the presence of clinical signs and  symptoms consistent with COVID-19, or in those who have been in contact with the virus.  Negative results must be combined with clinical observations, patient history, and epidemiological information. The expected result is Negative.  Fact Sheet for Patients: https://www.jennings-kim.com/  Fact Sheet for Healthcare Providers: https://alexander-rogers.biz/  This test is not yet approved or cleared by the [place]  FDA and  has been authorized for detection and/or diagnosis of SARS-CoV-2 by FDA under an Emergency Use Authorization (EUA).  This EUA will remain in effect (meaning this test can be used) for the duration of  the COV                          ID-19 declaration under Section 564(b)(1) of the Act, 21 U.S.C. section 360bbb-3(b)(1), unless the authorization is terminated or revoked sooner.    Admission on 07/13/2023, Discharged on 07/17/2023  Component Date Value Ref Range Status   Sodium 07/13/2023 141  135 - 145 mmol/L Final   Potassium 07/13/2023 3.6  3.5 - 5.1 mmol/L Final   Chloride 07/13/2023 109  98 - 111 mmol/L Final   CO2 07/13/2023 22  22 - 32 mmol/L Final   Glucose, Bld  07/13/2023 110 (H)  70 - 99 mg/dL Final   Glucose reference range applies only to samples taken after fasting for at least 8 hours.   BUN 07/13/2023 <5 (L)  6 - 20 mg/dL Final   Creatinine, Ser 07/13/2023 0.68  0.44 - 1.00 mg/dL Final   Calcium  07/13/2023 9.1  8.9 - 10.3 mg/dL Final   Total  Protein 07/13/2023 6.9  6.5 - 8.1 g/dL Final   Albumin 93/92/7974 3.7  3.5 - 5.0 g/dL Final   AST 93/92/7974 20  15 - 41 U/L Final   ALT 07/13/2023 18  0 - 44 U/L Final   Alkaline Phosphatase 07/13/2023 65  38 - 126 U/L Final   Total Bilirubin 07/13/2023 0.3  0.0 - 1.2 mg/dL Final   GFR, Estimated 07/13/2023 >60  >60 mL/min Final   Comment: (NOTE) Calculated using the CKD-EPI Creatinine Equation (2021)    Anion gap 07/13/2023 10  5 - 15 Final   Performed at [facility], [address], [place], [place] [address]   Alcohol , Ethyl (B) 07/13/2023 <15  <15 mg/dL Final   Comment: (NOTE) For medical purposes only. Performed at [facility], [address], [place], [place] [address]    Salicylate Lvl 07/13/2023 <7.0 (L)  7.0 - 30.0 mg/dL Final   Performed at [facility], [address] [place], [place] [address]   WBC 07/13/2023 7.6  4.0 - 10.5 K/uL Final   RBC 07/13/2023 4.57  3.87 - 5.11 MIL/uL Final   Hemoglobin 07/13/2023 11.5 (L)  12.0 - 15.0 g/dL Final   HCT 93/92/7974 37.2  36.0 - 46.0 % Final   MCV 07/13/2023 81.4  80.0 - 100.0 fL Final   MCH 07/13/2023 25.2 (L)  26.0 - 34.0 pg Final   MCHC 07/13/2023 30.9  30.0 - 36.0 g/dL Final   RDW 93/92/7974 14.8  11.5 - 15.5 % Final   Platelets 07/13/2023 263  150 - 400 K/uL Final   nRBC 07/13/2023 0.0  0.0 - 0.2 % Final   Neutrophils Relative % 07/13/2023 59  % Final   Neutro Abs 07/13/2023 4.5  1.7 - 7.7 K/uL Final   Lymphocytes Relative 07/13/2023 33  % Final   Lymphs Abs 07/13/2023 2.5  0.7 - 4.0 K/uL Final   Monocytes Relative 07/13/2023 6  % Final   Monocytes Absolute 07/13/2023 0.5  0.1 - 1.0 K/uL Final   Eosinophils Relative 07/13/2023 1  % Final   Eosinophils Absolute 07/13/2023 0.1  0.0 - 0.5 K/uL Final   Basophils Relative 07/13/2023 1  % Final   Basophils Absolute 07/13/2023 0.1  0.0 - 0.1 K/uL Final   Immature Granulocytes 07/13/2023 0  % Final   Abs Immature Granulocytes 07/13/2023 0.03  0.00 - 0.07  K/uL Final   Performed at [facility], [address] [place], [place] [address]   Opiates 07/13/2023 NONE DETECTED  NONE DETECTED Final   Cocaine 07/13/2023 NONE DETECTED  NONE DETECTED Final   Benzodiazepines 07/13/2023 NONE DETECTED  NONE DETECTED Final   Amphetamines 07/13/2023 NONE DETECTED  NONE DETECTED Final   Tetrahydrocannabinol 07/13/2023 NONE DETECTED  NONE DETECTED Final   Barbiturates 07/13/2023 NONE DETECTED  NONE DETECTED Final   Comment: (NOTE) DRUG SCREEN FOR MEDICAL PURPOSES ONLY.  IF CONFIRMATION IS NEEDED FOR ANY PURPOSE, NOTIFY LAB WITHIN 5 DAYS.  LOWEST DETECTABLE LIMITS FOR URINE DRUG SCREEN Drug Class  Cutoff (ng/mL) Amphetamine and metabolites    1000 Barbiturate and metabolites    200 Benzodiazepine                 200 Opiates and metabolites        300 Cocaine and metabolites        300 THC                            50 Performed at [facility], [address], [place], [place] [address]    Preg, Serum 07/13/2023 NEGATIVE  NEGATIVE Final   Comment:        THE SENSITIVITY OF THIS METHODOLOGY IS >10 mIU/mL. Performed at [facility], [address], [place], [place] [address]    Color, Urine 07/13/2023 YELLOW  YELLOW Final   APPearance 07/13/2023 HAZY (A)  CLEAR Final   Specific Gravity, Urine 07/13/2023 1.016  1.005 - 1.030 Final   pH 07/13/2023 5.0  5.0 - 8.0 Final   Glucose, UA 07/13/2023 NEGATIVE  NEGATIVE mg/dL Final   Hgb urine dipstick 07/13/2023 NEGATIVE  NEGATIVE Final   Bilirubin Urine 07/13/2023 NEGATIVE  NEGATIVE Final   Ketones, ur 07/13/2023 NEGATIVE  NEGATIVE mg/dL Final   Protein, ur 93/92/7974 NEGATIVE  NEGATIVE mg/dL Final   Nitrite 93/92/7974 NEGATIVE  NEGATIVE Final   Leukocytes,Ua 07/13/2023 NEGATIVE  NEGATIVE Final   Performed at [facility], [address] [place], [place] [address]   Acetaminophen  (Tylenol ), Serum 07/13/2023 <10 (L)  10 - 30 ug/mL Final   Comment:  (NOTE) Therapeutic concentrations vary significantly. A range of 10-30 ug/mL  may be an effective concentration for many patients. However, some  are best treated at concentrations outside of this range. Acetaminophen  concentrations >150 ug/mL at 4 hours after ingestion  and >50 ug/mL at 12 hours after ingestion are often associated with  toxic reactions.  Performed at [facility], [address] [place], [place] [address]    Acetaminophen  (Tylenol ), Serum 07/13/2023 <10 (L)  10 - 30 ug/mL Final   Comment: (NOTE) Therapeutic concentrations vary significantly. A range of 10-30 ug/mL  may be an effective concentration for many patients. However, some  are best treated at concentrations outside of this range. Acetaminophen  concentrations >150 ug/mL at 4 hours after ingestion  and >50 ug/mL at 12 hours after ingestion are often associated with  toxic reactions.  Performed at [facility], [address] [place], [place] [address]    Magnesium  07/13/2023 1.5 (L)  1.7 - 2.4 mg/dL Final   Performed at [facility], [address], [place], [place] [address]   Lactic Acid, Venous 07/13/2023 2.1 (HH)  0.5 - 1.9 mmol/L Final   Comment: CRITICAL RESULT CALLED TO, READ BACK BY AND VERIFIED WITH WEBER MORRIS R RN @1348  07/13/23 GORMAN ANTES Performed at [facility], [name], [place], [place] [address]    Sodium 07/14/2023 137  135 - 145 mmol/L Final   Potassium 07/14/2023 3.5  3.5 - 5.1 mmol/L Final   Chloride 07/14/2023 108  98 - 111 mmol/L Final   CO2 07/14/2023 23  22 - 32 mmol/L Final   Glucose, Bld 07/14/2023 92  70 - 99 mg/dL Final   Glucose reference range applies only to samples taken after fasting for at least 8 hours.   BUN 07/14/2023 5 (L)  6 - 20 mg/dL Final  Creatinine, Ser 07/14/2023 0.70  0.44 - 1.00 mg/dL Final   Calcium  07/14/2023 8.2 (L)  8.9 - 10.3 mg/dL Final   Total Protein 93/91/7974 5.9 (L)  6.5 - 8.1 g/dL Final   Albumin 93/91/7974  3.1 (L)  3.5 - 5.0 g/dL Final   AST 93/91/7974 19  15 - 41 U/L Final   ALT 07/14/2023 18  0 - 44 U/L Final   Alkaline Phosphatase 07/14/2023 55  38 - 126 U/L Final   Total Bilirubin 07/14/2023 0.4  0.0 - 1.2 mg/dL Final   GFR, Estimated 07/14/2023 >60  >60 mL/min Final   Comment: (NOTE) Calculated using the CKD-EPI Creatinine Equation (2021)    Anion gap 07/14/2023 6  5 - 15 Final   Performed at [facility], [address], [place], [place] [address]   WBC 07/14/2023 6.9  4.0 - 10.5 K/uL Final   RBC 07/14/2023 4.14  3.87 - 5.11 MIL/uL Final   Hemoglobin 07/14/2023 10.7 (L)  12.0 - 15.0 g/dL Final   HCT 93/91/7974 33.6 (L)  36.0 - 46.0 % Final   MCV 07/14/2023 81.2  80.0 - 100.0 fL Final   MCH 07/14/2023 25.8 (L)  26.0 - 34.0 pg Final   MCHC 07/14/2023 31.8  30.0 - 36.0 g/dL Final   RDW 93/91/7974 14.6  11.5 - 15.5 % Final   Platelets 07/14/2023 253  150 - 400 K/uL Final   nRBC 07/14/2023 0.0  0.0 - 0.2 % Final   Performed at [facility], [address], [place], [place] [address]   Magnesium  07/14/2023 1.9  1.7 - 2.4 mg/dL Final   Performed at [facility], [address] [place], [place] [address]  Admission on 06/18/2023, Discharged on 06/18/2023  Component Date Value Ref Range Status   WBC 06/18/2023 8.5  4.0 - 10.5 K/uL Final   RBC 06/18/2023 4.31  3.87 - 5.11 MIL/uL Final   Hemoglobin 06/18/2023 10.8 (L)  12.0 - 15.0 g/dL Final   HCT 94/86/7974 35.1 (L)  36.0 - 46.0 % Final   MCV 06/18/2023 81.4  80.0 - 100.0 fL Final   MCH 06/18/2023 25.1 (L)  26.0 - 34.0 pg Final   MCHC 06/18/2023 30.8  30.0 - 36.0 g/dL Final   RDW 94/86/7974 15.0  11.5 - 15.5 % Final   Platelets 06/18/2023 255  150 - 400 K/uL Final   nRBC 06/18/2023 0.0  0.0 - 0.2 % Final   Performed at [facility], [address] [place], [place] [address]   Sodium 06/18/2023 138  135 - 145 mmol/L Final   Potassium 06/18/2023 3.8  3.5 - 5.1 mmol/L Final   Chloride 06/18/2023  106  98 - 111 mmol/L Final   CO2 06/18/2023 24  22 - 32 mmol/L Final   Glucose, Bld 06/18/2023 90  70 - 99 mg/dL Final   Glucose reference range applies only to samples taken after fasting for at least 8 hours.   BUN 06/18/2023 <5 (L)  6 - 20 mg/dL Final   Creatinine, Ser 06/18/2023 0.66  0.44 - 1.00 mg/dL Final   Calcium  06/18/2023 8.7 (L)  8.9 - 10.3 mg/dL Final   Total Protein 94/86/7974 6.4 (L)  6.5 - 8.1 g/dL Final   Albumin 94/86/7974 3.2 (L)  3.5 - 5.0 g/dL Final   AST 94/86/7974 20  15 - 41 U/L Final   ALT 06/18/2023 20  0 - 44 U/L Final   Alkaline Phosphatase 06/18/2023  67  38 - 126 U/L Final   Total Bilirubin 06/18/2023 0.5  0.0 - 1.2 mg/dL Final   GFR, Estimated 06/18/2023 >60  >60 mL/min Final   Comment: (NOTE) Calculated using the CKD-EPI Creatinine Equation (2021)    Anion gap 06/18/2023 8  5 - 15 Final   Performed at [facility], [address], [place], [place] [address]   Color, Urine 06/18/2023 YELLOW  YELLOW Final   APPearance 06/18/2023 HAZY (A)  CLEAR Final   Specific Gravity, Urine 06/18/2023 1.017  1.005 - 1.030 Final   pH 06/18/2023 6.0  5.0 - 8.0 Final   Glucose, UA 06/18/2023 NEGATIVE  NEGATIVE mg/dL Final   Hgb urine dipstick 06/18/2023 NEGATIVE  NEGATIVE Final   Bilirubin Urine 06/18/2023 NEGATIVE  NEGATIVE Final   Ketones, ur 06/18/2023 NEGATIVE  NEGATIVE mg/dL Final   Protein, ur 94/86/7974 NEGATIVE  NEGATIVE mg/dL Final   Nitrite 94/86/7974 NEGATIVE  NEGATIVE Final   Leukocytes,Ua 06/18/2023 TRACE (A)  NEGATIVE Final   RBC / HPF 06/18/2023 0-5  0 - 5 RBC/hpf Final   WBC, UA 06/18/2023 11-20  0 - 5 WBC/hpf Final   Bacteria, UA 06/18/2023 RARE (A)  NONE SEEN Final   Squamous Epithelial / HPF 06/18/2023 6-10  0 - 5 /HPF Final   Mucus 06/18/2023 PRESENT   Final   Performed at [facility], [address], [place], [place] [address]   Opiates 06/18/2023 NONE DETECTED  NONE DETECTED Final   Cocaine 06/18/2023 NONE  DETECTED  NONE DETECTED Final   Benzodiazepines 06/18/2023 NONE DETECTED  NONE DETECTED Final   Amphetamines 06/18/2023 NONE DETECTED  NONE DETECTED Final   Tetrahydrocannabinol 06/18/2023 NONE DETECTED  NONE DETECTED Final   Barbiturates 06/18/2023 NONE DETECTED  NONE DETECTED Final   Comment: (NOTE) DRUG SCREEN FOR MEDICAL PURPOSES ONLY.  IF CONFIRMATION IS NEEDED FOR ANY PURPOSE, NOTIFY LAB WITHIN 5 DAYS.  LOWEST DETECTABLE LIMITS FOR URINE DRUG SCREEN Drug Class                     Cutoff (ng/mL) Amphetamine and metabolites    1000 Barbiturate and metabolites    200 Benzodiazepine                 200 Opiates and metabolites        300 Cocaine and metabolites        300 THC                            50 Performed at [facility], [address], [place], [place] [address]    Preg Test, Ur 06/18/2023 NEGATIVE  NEGATIVE Final   Comment:        THE SENSITIVITY OF THIS METHODOLOGY IS >25 mIU/mL. Performed at [facility], [address] [place], [place] [address]    Troponin I (High Sensitivity) 06/18/2023 <2  <18 ng/L Final   Comment: (NOTE) Elevated high sensitivity troponin I (hsTnI) values and significant  changes across serial measurements may suggest ACS but many other  chronic and acute conditions are known to elevate hsTnI results.  Refer to the Links section for chest pain algorithms and additional  guidance. Performed at [facility], [address] [place], [place] [address]    Acetaminophen  (Tylenol ), Serum 06/18/2023 <10 (L)  10 - 30 ug/mL Final   Comment: (NOTE) Therapeutic concentrations vary significantly. A range  of 10-30 ug/mL  may be an effective concentration for many patients. However, some  are best treated at concentrations outside of this range. Acetaminophen  concentrations >150 ug/mL at 4 hours after ingestion  and >50 ug/mL at 12 hours after ingestion are often associated with  toxic  reactions.  Performed at [facility], [address] [place], [place] [address]    Salicylate Lvl 06/18/2023 <7.0 (L)  7.0 - 30.0 mg/dL Final   Performed at [facility], [address], [place], [place] [address]   D-Dimer, Earleen 06/18/2023 <0.27  0.00 - 0.50 ug/mL-FEU Final   Comment: (NOTE) At the manufacturer cut-off value of 0.5 g/mL FEU, this assay has a negative predictive value of 95-100%.This assay is intended for use in conjunction with a clinical pretest probability (PTP) assessment model to exclude pulmonary embolism (PE) and deep venous thrombosis (DVT) in outpatients suspected of PE or DVT. Results should be correlated with clinical presentation. Performed at [facility], [address] [place], [place] [address]   Admission on 06/17/2023, Discharged on 06/17/2023  Component Date Value Ref Range Status   Sodium 06/17/2023 138  135 - 145 mmol/L Final   Potassium 06/17/2023 3.6  3.5 - 5.1 mmol/L Final   Chloride 06/17/2023 102  98 - 111 mmol/L Final   CO2 06/17/2023 27  22 - 32 mmol/L Final   Glucose, Bld 06/17/2023 112 (H)  70 - 99 mg/dL Final   Glucose reference range applies only to samples taken after fasting for at least 8 hours.   BUN 06/17/2023 5 (L)  6 - 20 mg/dL Final   Creatinine, Ser 06/17/2023 0.77  0.44 - 1.00 mg/dL Final   Calcium  06/17/2023 9.1  8.9 - 10.3 mg/dL Final   Total Protein 94/87/7974 7.0  6.5 - 8.1 g/dL Final   Albumin 94/87/7974 3.7  3.5 - 5.0 g/dL Final   AST 94/87/7974 21  15 - 41 U/L Final   ALT 06/17/2023 22  0 - 44 U/L Final   Alkaline Phosphatase 06/17/2023 68  38 - 126 U/L Final   Total Bilirubin 06/17/2023 0.5  0.0 - 1.2 mg/dL Final   GFR, Estimated 06/17/2023 >60  >60 mL/min Final   Comment: (NOTE) Calculated using the CKD-EPI Creatinine Equation (2021)    Anion gap 06/17/2023 9  5 - 15 Final   Performed at [facility], [address], [place], [place] [address]    Alcohol , Ethyl (B) 06/17/2023 <15  <15 mg/dL Final   Comment: Please note change in reference range. (NOTE) For medical purposes only. Performed at [facility], [address], [place], [place] [address]    WBC 06/17/2023 9.6  4.0 - 10.5 K/uL Final   RBC 06/17/2023 4.66  3.87 - 5.11 MIL/uL Final   Hemoglobin 06/17/2023 11.6 (L)  12.0 - 15.0 g/dL Final   HCT 94/87/7974 37.4  36.0 - 46.0 % Final   MCV 06/17/2023 80.3  80.0 - 100.0 fL Final   MCH 06/17/2023 24.9 (L)  26.0 - 34.0 pg Final   MCHC 06/17/2023 31.0  30.0 - 36.0 g/dL Final   RDW 94/87/7974 14.9  11.5 - 15.5 % Final   Platelets 06/17/2023 275  150 - 400 K/uL Final   nRBC 06/17/2023 0.0  0.0 - 0.2 % Final   Performed at [facility], [address] [place], [place] [address]   Opiates 06/17/2023 NONE DETECTED  NONE DETECTED Final   Cocaine  06/17/2023 NONE DETECTED  NONE DETECTED Final   Benzodiazepines 06/17/2023 NONE DETECTED  NONE DETECTED Final   Amphetamines 06/17/2023 NONE DETECTED  NONE DETECTED Final   Tetrahydrocannabinol 06/17/2023 NONE DETECTED  NONE DETECTED Final   Barbiturates 06/17/2023 NONE DETECTED  NONE DETECTED Final   Comment: (NOTE) DRUG SCREEN FOR MEDICAL PURPOSES ONLY.  IF CONFIRMATION IS NEEDED FOR ANY PURPOSE, NOTIFY LAB WITHIN 5 DAYS.  LOWEST DETECTABLE LIMITS FOR URINE DRUG SCREEN Drug Class                     Cutoff (ng/mL) Amphetamine and metabolites    1000 Barbiturate and metabolites    200 Benzodiazepine                 200 Opiates and metabolites        300 Cocaine and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

## 2023-09-26 NOTE — Progress Notes (Signed)
 LCSW Progress Note  [id number]   Damyra Luscher  09/26/2023  3:40 PM  Description:   Inpatient Psychiatric Referral  Patient was recommended inpatient per Justino Cornish MD. There are no available beds at [facility], per [facility] Bretta Qua RN). Patient was referred to the following out of network facilities:   Destination  Service Provider Request Status Services Address Phone Fax Patient Preferred  [facility]  Pending - Request Sent -- [address], [place] [address] [phone] --  [facility]  Pending - Request Sent -- [address], [place] [address] [phone] --  [facility]  Pending - Request Sent -- [address] [place], [place] [address] [phone] --  [facility]  Pending - Request Sent -- [address] [place] [address] [phone] --  [facility]  Pending - Request Sent -- [address] [place] [address] [phone] --  [facility]  Pending - Request Sent -- [address] [place] [address] [phone] --  [facility]  Pending - Request Sent -- [address] [place] [address] [phone] --  [facility]  Pending - Request Sent -- [address] [place] [address] [phone] --  [facility]  Pending - Request Sent -- [address] [place] [phone] --  [facility]  Pending - Request Sent -- [address] [place] [address] [phone] --  [facility]  Pending - Request Sent -- [address] [place] [address] [phone] --      Situation ongoing, CSW to continue following and update chart as more information  becomes available.     [place] Suraiya Dickerson MSW, LCSW  09/26/2023 3:40 PM

## 2023-09-27 DIAGNOSIS — F250 Schizoaffective disorder, bipolar type: Secondary | ICD-10-CM | POA: Diagnosis not present

## 2023-09-27 NOTE — ED Notes (Signed)
 Pt is asleep in recliner at this time. No distress noted. Staff will continue to monitor pt for safety.

## 2023-09-27 NOTE — ED Provider Notes (Signed)
 FBC/OBS ASAP Discharge Summary  Date and Time: 09/27/2023 2:56 PM  Name: [name]  MRN:  [id number]   Discharge Diagnoses:  Final diagnoses:  Schizoaffective disorder, bipolar type [facility])  Medication management   HPI: Rodina is a 33 year old woman with past history of Asperger syndrome, borderline personality disorder, schizoaffective disorder, and multiple previous suicide attempts who presented to [facility] due to her missing her medications and suicidal thoughts.   Subjective:  Today patient reports she is feeling better and asking to leave. Discussed that if she calls her ACT team and they are open to her discharging that we will consider otherwise she is going to hospital. Denies AVH. Does not cooperate any more in interivew.   Stay Summary: pt had suicidal thoughts on admission that per pt resolved completely. Pt was IVC given extensive hx with being aggressive to mild issues which presented safety issues with transport to hospital so was IVC'd and accepted by court. Pt accepted to [facility] Was not observed to be responding to internal stimuli. Restarted on her home medications.   Total Time spent with patient: 30 minutes  Past Psychiatric History: Asperger syndrome, borderline personality disorder, schizoaffective disorder, and multiple previous suicide attempts Past Medical History: essential tremor, chronic constipation, acid reflux, seizures, asthma, sleep apnea, neuromuscular disorder, nromocytic anemia, paroxysmal atrial fibrillation Family History: father- asthma, mental illness, brother- PDD, seizures Social History: Is married to husband and has kids (did not disclose how many). 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  Tobacco Cessation:  N/A, patient does not currently use tobacco products  Current Medications:  Current Facility-Administered Medications  Medication Dose Route Frequency Provider Last Rate Last Admin   alum & mag  hydroxide-simeth (MAALOX/MYLANTA) 200-200-20 MG/5ML suspension 30 mL  30 mL Oral Q4H PRN Trudy Carwin, NP       benztropine  (COGENTIN ) tablet 0.5 mg  0.5 mg Oral BID Pearse Shiffler, MD   0.5 mg at 09/27/23 1121   gabapentin  (NEURONTIN ) capsule 300 mg  300 mg Oral Q1500 Trixie Maclaren, MD   300 mg at 09/27/23 1447   gabapentin  (NEURONTIN ) capsule 600 mg  600 mg Oral BID Vitali Seibert, MD   600 mg at 09/27/23 1121   melatonin tablet 3 mg  3 mg Oral QHS Sunaina Ferrando, MD   3 mg at 09/26/23 2202   OLANZapine  (ZYPREXA ) injection 10 mg  10 mg Intramuscular TID PRN Trudy Carwin, NP       OLANZapine  (ZYPREXA ) injection 5 mg  5 mg Intramuscular TID PRN Trudy Carwin, NP       OLANZapine  zydis (ZYPREXA ) disintegrating tablet 5 mg  5 mg Oral TID PRN Trudy Carwin, NP       pantoprazole  (PROTONIX ) EC tablet 40 mg  40 mg Oral Daily Cache Bills, MD   40 mg at 09/27/23 1121   prazosin  (MINIPRESS ) capsule 2 mg  2 mg Oral QHS Nakira Litzau, MD   2 mg at 09/26/23 2202   risperiDONE  (RISPERDAL ) tablet 3 mg  3 mg Oral BID Maleyah Evans, MD   3 mg at 09/27/23 1121   sertraline  (ZOLOFT ) tablet 150 mg  150 mg Oral Daily Sheana Bir, MD   150 mg at 09/27/23 1121   Current Outpatient Medications  Medication Sig Dispense Refill   gabapentin  (NEURONTIN ) 300 MG capsule Take 300 mg by mouth daily in the afternoon.     gabapentin  (NEURONTIN ) 600 MG tablet Take 600 mg by mouth 2 (two) times  daily.     pantoprazole  (PROTONIX ) 40 MG tablet Take 40 mg by mouth daily.     benztropine  (COGENTIN ) 0.5 MG tablet Take 0.5 mg by mouth 2 (two) times daily.     Melatonin 3 MG CAPS Take 3 mg by mouth at bedtime as needed (sleep).     prazosin  (MINIPRESS ) 2 MG capsule Take 2 mg by mouth at bedtime.     risperiDONE  (RISPERDAL ) 3 MG tablet Take 3 mg by mouth 2 (two) times daily.     sertraline  (ZOLOFT ) 100 MG tablet Take 150 mg by mouth daily. Pt takes 1.5 tablets daily (150 mg)      PTA Medications:  Facility Ordered  Medications  Medication   alum & mag hydroxide-simeth (MAALOX/MYLANTA) 200-200-20 MG/5ML suspension 30 mL   OLANZapine  zydis (ZYPREXA ) disintegrating tablet 5 mg   OLANZapine  (ZYPREXA ) injection 5 mg   OLANZapine  (ZYPREXA ) injection 10 mg   sertraline  (ZOLOFT ) tablet 150 mg   risperiDONE  (RISPERDAL ) tablet 3 mg   prazosin  (MINIPRESS ) capsule 2 mg   pantoprazole  (PROTONIX ) EC tablet 40 mg   gabapentin  (NEURONTIN ) capsule 600 mg   gabapentin  (NEURONTIN ) capsule 300 mg   benztropine  (COGENTIN ) tablet 0.5 mg   melatonin tablet 3 mg   PTA Medications  Medication Sig   gabapentin  (NEURONTIN ) 600 MG tablet Take 600 mg by mouth 2 (two) times daily.   pantoprazole  (PROTONIX ) 40 MG tablet Take 40 mg by mouth daily.   gabapentin  (NEURONTIN ) 300 MG capsule Take 300 mg by mouth daily in the afternoon.   sertraline  (ZOLOFT ) 100 MG tablet Take 150 mg by mouth daily. Pt takes 1.5 tablets daily (150 mg)   prazosin  (MINIPRESS ) 2 MG capsule Take 2 mg by mouth at bedtime.   Melatonin 3 MG CAPS Take 3 mg by mouth at bedtime as needed (sleep).   benztropine  (COGENTIN ) 0.5 MG tablet Take 0.5 mg by mouth 2 (two) times daily.   risperiDONE  (RISPERDAL ) 3 MG tablet Take 3 mg by mouth 2 (two) times daily.       03/10/2022   10:01 PM 03/10/2022    9:56 PM 02/10/2022    1:49 AM  Depression screen PHQ 2/9  Decreased Interest 1 1 2   Down, Depressed, Hopeless 1 1 2   PHQ - 2 Score 2 2 4   Altered sleeping 1 1 2   Tired, decreased energy 1 1 2   Change in appetite 1 0 2  Feeling bad or failure about yourself  1 1 2   Trouble concentrating 1 1 2   Moving slowly or fidgety/restless 1 1 1   Suicidal thoughts 1 1 1   PHQ-9 Score 9 8 16   Difficult doing work/chores Somewhat difficult  Very difficult    Flowsheet Row ED from 09/25/2023 in [facility] ED from 07/31/2023 in [facility] ED to Hosp-Admission (Discharged) from 07/13/2023 in [facility]   C-SSRS RISK CATEGORY Low Risk High Risk High Risk    Musculoskeletal  Strength & Muscle Tone: within normal limits Gait & Station: normal Patient leans: N/A  Psychiatric Specialty Exam  Presentation  General Appearance:  Casual  Eye Contact: Good  Speech: Clear and Coherent  Speech Volume: Normal  Handedness: Right   Mood and Affect  Mood: Anxious  Affect: Congruent   Thought Process  Thought Processes: Coherent  Descriptions of Associations:Intact  Orientation:Full (Time, Place and Person)  Thought Content:WDL  Diagnosis of Schizophrenia or Schizoaffective disorder in past: Yes  Duration of  Psychotic Symptoms: Greater than six months   Hallucinations: denies Ideas of Reference:None  Suicidal Thoughts: denies Homicidal Thoughts: denies  Sensorium  Memory: Immediate Fair  Judgment: Poor  Insight: Shallow   Executive Functions  Concentration: Good  Attention Span: Good  Recall: Good  Fund of Knowledge: Good  Language: Good   Psychomotor Activity  Psychomotor Activity:normal  Assets  Assets: Desire for Improvement   Sleep  Sleep: 7 hours    Physical Exam  Physical Exam Vitals and nursing note reviewed.  Constitutional:      General: She is not in acute distress. HENT:     Head: Normocephalic and atraumatic.  Pulmonary:     Effort: Pulmonary effort is normal.  Neurological:     General: No focal deficit present.     Mental Status: She is alert.  Psychiatric:     Comments: No EPS    Review of Systems  Constitutional:  Negative for fever.  Cardiovascular:  Negative for chest pain and palpitations.  Gastrointestinal:  Negative for constipation, diarrhea, nausea and vomiting.  Neurological:  Negative for dizziness, weakness and headaches.  Psychiatric/Behavioral:         Pt denies extrapyramidal symptoms including dystonia (sudden spastic contractions of muscle groups), parkinsonism (bradykinesia, tremors,  rigidity), and akathisia (severe restlessness).    Blood pressure 134/72, pulse 93, temperature 98.2 F (36.8 C), temperature source Oral, resp. rate 19, SpO2 100%. There is no height or weight on file to calculate BMI.  Demographic Factors:  Caucasian and Low socioeconomic status  Loss Factors: NA  Historical Factors: Extensive history of suicide attempts and self harm. Significant borderline traits with regard to self harming in response to not getting her way. Know to have suicidal thoughts, suicide attempts, and aggression in response to plan that she did not agree (e.g. hospital she does not like).   Risk Reduction Factors:   NA. ACT follow up but they said she needs to be hospitalized.   Continued Clinical Symptoms:  Continued depressive symptoms. No evidence of psychosis.   Cognitive Features That Contribute To Risk:  Loss of executive function    Suicide Risk:  Severe:  significant hx of suicide attempts that are very impulsive in nature and conditional in situations in which she is not agreeing with plan such as hospital she does not like. ACT team reports pt has significant historical risk and if she is off meds and seeking [facility]UC then she is severe risk.   Plan Of Care/Follow-up recommendations:  Per ACTT collateral and pt reporting on admission that she was mentally unstable off medication, plan to persue inpatient. IVC given pt has been imminent harm to self and others in past when finding out about certain hospital includign attacking staff, throwing objects, attempting suicide, etc, so she was IVC to avoid bad outcome if going voluntary in safe tranpsort where she could grab driving and crash car.   Disposition: transfer to [facility] under IVC.   Justino Cornish, MD PGY-2 Psychiatry Resident 09/27/2023, 2:56 PM

## 2023-09-27 NOTE — ED Notes (Signed)
 Patient currently resting in recliner. RR even and unlabored, appearing in no noted distress. Environmental check complete

## 2023-09-27 NOTE — ED Notes (Signed)
 Patient currently resting in recliner watching TV. RR even and unlabored, appearing in no noted distress. Environmental check complete

## 2023-09-27 NOTE — ED Notes (Signed)
 Pt is asleep in recliner. Respirations even and unlabored. No distress noted at this time. Staff will continue to monitor pt for safety.

## 2023-09-27 NOTE — ED Notes (Signed)
 Patient resting with eyes closed. Respirations even and unlabored. No distress noted. Environment secured. Plan of care ongoing, no further concerns as of present.

## 2023-09-27 NOTE — ED Notes (Signed)
 The patient is sitting in the recliner, watching television. No distress noted. Environment is secured. Plan of care ongoing, no further concerns as of present. Patient expresses no other needs at this time.

## 2023-09-27 NOTE — ED Notes (Signed)
 Patient A&Ox4. Denies intent/thoughts to harm self/others when asked. Denies A/VH. Patient denies any physical complaints when asked. No distress noted. Support and encouragement provided. Routine safety checks conducted according to facility protocol. Encouraged patient to notify staff if thoughts of harm toward self or others arise. Endorses safety. Patient verbalized understanding and agreement. Plan of care ongoing, no further concerns as of present. Patient expresses no other needs at this time.

## 2023-09-28 NOTE — Discharge Instructions (Signed)
 Accepted to [facility]

## 2023-09-28 NOTE — ED Notes (Signed)
 Patient A&Ox4. Denies intent/thoughts to harm self/others when asked. Denies A/VH. Patient denies any physical complaints when asked. No distress noted. Support and encouragement provided. Routine safety checks conducted according to facility protocol. Encouraged patient to notify staff if thoughts of harm toward self or others arise. Endorses safety. Patient verbalized understanding and agreement. Plan of care ongoing, no further concerns as of present. Patient expresses no other needs at this time.

## 2023-09-28 NOTE — ED Notes (Signed)
 Patient currently sleeping and resting in recliner. RR even and unlabored, appearing in no noted distress. Environmental check complete

## 2023-10-19 ENCOUNTER — Emergency Department (HOSPITAL_COMMUNITY)
Admission: EM | Admit: 2023-10-19 | Discharge: 2023-10-20 | Disposition: A | Discharge status: Home / Self Care | Attending: Emergency Medicine | Admitting: Emergency Medicine

## 2023-10-19 ENCOUNTER — Encounter (HOSPITAL_COMMUNITY): Payer: Self-pay

## 2023-10-19 ENCOUNTER — Other Ambulatory Visit: Payer: Self-pay

## 2023-10-19 DIAGNOSIS — Z765 Malingerer [conscious simulation]: Secondary | ICD-10-CM | POA: Diagnosis not present

## 2023-10-19 DIAGNOSIS — Z87891 Personal history of nicotine dependence: Secondary | ICD-10-CM | POA: Insufficient documentation

## 2023-10-19 DIAGNOSIS — Z91199 Patient's noncompliance with other medical treatment and regimen due to unspecified reason: Secondary | ICD-10-CM

## 2023-10-19 DIAGNOSIS — F603 Borderline personality disorder: Secondary | ICD-10-CM | POA: Diagnosis not present

## 2023-10-19 DIAGNOSIS — J45909 Unspecified asthma, uncomplicated: Secondary | ICD-10-CM | POA: Diagnosis not present

## 2023-10-19 DIAGNOSIS — F840 Autistic disorder: Secondary | ICD-10-CM | POA: Diagnosis not present

## 2023-10-19 DIAGNOSIS — Z7289 Other problems related to lifestyle: Secondary | ICD-10-CM | POA: Insufficient documentation

## 2023-10-19 DIAGNOSIS — R45851 Suicidal ideations: Secondary | ICD-10-CM | POA: Diagnosis not present

## 2023-10-19 DIAGNOSIS — R4689 Other symptoms and signs involving appearance and behavior: Secondary | ICD-10-CM | POA: Diagnosis present

## 2023-10-19 LAB — COMPREHENSIVE METABOLIC PANEL WITH GFR
ALT: 18 U/L (ref 0–44)
AST: 17 U/L (ref 15–41)
Albumin: 3.7 g/dL (ref 3.5–5.0)
Alkaline Phosphatase: 86 U/L (ref 38–126)
Anion gap: 11 (ref 5–15)
BUN: 5 mg/dL — ABNORMAL LOW (ref 6–20)
CO2: 23 mmol/L (ref 22–32)
Calcium: 9.2 mg/dL (ref 8.9–10.3)
Chloride: 104 mmol/L (ref 98–111)
Creatinine, Ser: 0.76 mg/dL (ref 0.44–1.00)
GFR, Estimated: >60 mL/min (ref >60)
Glucose, Bld: 99 mg/dL (ref 70–99)
Potassium: 3.3 mmol/L — ABNORMAL LOW (ref 3.5–5.1)
Sodium: 138 mmol/L (ref 135–145)
Total Bilirubin: 0.6 mg/dL (ref 0.0–1.2)
Total Protein: 7.1 g/dL (ref 6.5–8.1)

## 2023-10-19 LAB — CBC
HCT: 37 % (ref 36.0–46.0)
Hemoglobin: 11.6 g/dL — ABNORMAL LOW (ref 12.0–15.0)
MCH: 24.8 pg — ABNORMAL LOW (ref 26.0–34.0)
MCHC: 31.4 g/dL (ref 30.0–36.0)
MCV: 79.2 fL — ABNORMAL LOW (ref 80.0–100.0)
Platelets: 278 K/uL (ref 150–400)
RBC: 4.67 MIL/uL (ref 3.87–5.11)
RDW: 13.5 % (ref 11.5–15.5)
WBC: 9 K/uL (ref 4.0–10.5)
nRBC: 0 % (ref 0.0–0.2)

## 2023-10-19 LAB — RAPID URINE DRUG SCREEN, HOSP PERFORMED
Amphetamines: NOT DETECTED
Barbiturates: NOT DETECTED
Benzodiazepines: NOT DETECTED
Cocaine: NOT DETECTED
Opiates: NOT DETECTED
Tetrahydrocannabinol: NOT DETECTED

## 2023-10-19 LAB — ETHANOL: Alcohol, Ethyl (B): <15 mg/dL (ref <15)

## 2023-10-19 LAB — ACETAMINOPHEN LEVEL: Acetaminophen (Tylenol), Serum: <10 ug/mL — ABNORMAL LOW (ref 10–30)

## 2023-10-19 LAB — SALICYLATE LEVEL: Salicylate Lvl: <7 mg/dL — ABNORMAL LOW (ref 7.0–30.0)

## 2023-10-19 LAB — PREGNANCY, URINE: Preg Test, Ur: NEGATIVE

## 2023-10-19 MED ORDER — SERTRALINE HCL 100 MG PO TABS: 150.0000 mg | ORAL_TABLET | Freq: Every day | ORAL | 0 refills | Status: AC

## 2023-10-19 MED ORDER — GABAPENTIN 300 MG PO CAPS: 300.0000 mg | ORAL_CAPSULE | Freq: Every day | ORAL | 0 refills | Status: AC

## 2023-10-19 MED ORDER — BENZTROPINE MESYLATE 0.5 MG PO TABS: 0.5000 mg | ORAL_TABLET | Freq: Two times a day (BID) | ORAL | 0 refills | Status: AC

## 2023-10-19 MED ORDER — RISPERIDONE 3 MG PO TABS: 3.0000 mg | ORAL_TABLET | Freq: Two times a day (BID) | ORAL | 0 refills | Status: DC

## 2023-10-19 MED ORDER — GABAPENTIN 600 MG PO TABS: 600.0000 mg | ORAL_TABLET | Freq: Two times a day (BID) | ORAL | 0 refills | Status: AC

## 2023-10-19 NOTE — ED Notes (Signed)
 Pt wanded by security.

## 2023-10-19 NOTE — ED Notes (Signed)
 The patient has changed into paper scrubs

## 2023-10-19 NOTE — ED Provider Notes (Signed)
 [place] EMERGENCY DEPARTMENT AT [facility] Provider Note   CSN: [id number] Arrival date & time: 10/19/23  2017     Patient presents with: Suicidal   [name] is a 33 y.o. female.   HPI     This is a 33 year old female with history of bipolar disorder, borderline personality disorder, chronic suicidality who presents with decreased p.o. intake and suicidal thoughts.  Does not have a plan.  Reports that she has not taken her psych meds in over 9 days because she has run out of her medications.  She reports that her ACT team will not refill them.  Denies any physical complaints.  Denies any alcohol  or drug use.  Prior to Admission medications   Medication Sig Start Date End Date Taking? Authorizing Provider  benztropine  (COGENTIN ) 0.5 MG tablet Take 1 tablet (0.5 mg total) by mouth 2 (two) times daily. 10/19/23   Mirabel Ahlgren, Charmaine FALCON, MD  gabapentin  (NEURONTIN ) 300 MG capsule Take 1 capsule (300 mg total) by mouth daily in the afternoon. 10/19/23   Parnika Tweten, Charmaine FALCON, MD  gabapentin  (NEURONTIN ) 600 MG tablet Take 1 tablet (600 mg total) by mouth 2 (two) times daily. 10/19/23   Wynona Duhamel, Charmaine FALCON, MD  Melatonin 3 MG CAPS Take 3 mg by mouth at bedtime as needed (sleep).    [provider]  pantoprazole  (PROTONIX ) 40 MG tablet Take 40 mg by mouth daily.    [provider]  prazosin  (MINIPRESS ) 2 MG capsule Take 2 mg by mouth at bedtime.    [provider]  risperiDONE  (RISPERDAL ) 3 MG tablet Take 1 tablet (3 mg total) by mouth 2 (two) times daily. 10/19/23   Kamali Sakata, Charmaine FALCON, MD  sertraline  (ZOLOFT ) 100 MG tablet Take 1.5 tablets (150 mg total) by mouth daily. Pt takes 1.5 tablets daily (150 mg) 10/19/23   Westly Hinnant, Charmaine FALCON, MD    Allergies: Bee venom, Coconut flavoring agent (non-screening), Fish allergy, Geodon  [ziprasidone  hcl], Haloperidol  and related, Lithobid [lithium ], Roxicodone  Brigitte.Buhl ], Seroquel  [quetiapine ], Shellfish allergy,  Phenergan  [promethazine  hcl], Prilosec [omeprazole], Sulfa antibiotics, Tegretol  [carbamazepine ], Prozac  [fluoxetine ], Tape, and Tylenol  [acetaminophen ]    Review of Systems  Constitutional:  Negative for fever.  Respiratory:  Negative for shortness of breath.   Cardiovascular:  Negative for chest pain.  Psychiatric/Behavioral:  Positive for dysphoric mood. Negative for self-injury and sleep disturbance.   All other systems reviewed and are negative.   Updated Vital Signs BP (!) 126/59 (BP Location: Left Arm)   Pulse 98   Temp 98.9 F (37.2 C) (Oral)   Resp 17   Ht 1.651 m (5' 5)   Wt 104.3 kg   LMP  (LMP Unknown)   SpO2 100%   BMI 38.27 kg/m   Physical Exam Vitals and nursing note reviewed.  Constitutional:      Appearance: She is well-developed. She is obese. She is not ill-appearing.  HENT:     Head: Normocephalic and atraumatic.  Eyes:     Pupils: Pupils are equal, round, and reactive to light.  Cardiovascular:     Rate and Rhythm: Normal rate and regular rhythm.  Pulmonary:     Effort: Pulmonary effort is normal. No respiratory distress.  Musculoskeletal:     Cervical back: Neck supple.  Skin:    General: Skin is warm and dry.  Neurological:     Mental Status: She is alert and oriented to person, place, and time.  Psychiatric:     Comments: At times uncooperative  but will answer questions when pushed     (all labs ordered are listed, but only abnormal results are displayed) Labs Reviewed  CBC - Abnormal; Notable for the following components:      Result Value   Hemoglobin 11.6 (*)    MCV 79.2 (*)    MCH 24.8 (*)    All other components within normal limits  COMPREHENSIVE METABOLIC PANEL WITH GFR - Abnormal; Notable for the following components:   Potassium 3.3 (*)    BUN 5 (*)    All other components within normal limits  ACETAMINOPHEN  LEVEL - Abnormal; Notable for the following components:   Acetaminophen  (Tylenol ), Serum <10 (*)    All other  components within normal limits  SALICYLATE LEVEL - Abnormal; Notable for the following components:   Salicylate Lvl <7.0 (*)    All other components within normal limits  RAPID URINE DRUG SCREEN, HOSP PERFORMED  PREGNANCY, URINE  ETHANOL    EKG: None  Radiology: No results found.   Procedures   Medications Ordered in the ED - No data to display                                  Medical Decision Making Risk Prescription drug management.   This patient presents to the ED for concern of depression, suicidality, this involves an extensive number of treatment options, and is a complaint that carries with it a high risk of complications and morbidity.  I considered the following differential and admission for this acute, potentially life threatening condition.  The differential diagnosis includes passive SI, worsening depression  MDM:    This is a 33 year old female who presents with suicidal ideation.  History of the same.  Was recently observed to the behavioral health hospital for the same.  Reports he has not been on her meds for over a week.  She has been somewhat difficult to obtain history from and sometimes uncooperative.  Denies a plan.  She was evaluated by [facility] and cleared from a psychiatric perspective.  Full note was reviewed.  I did refill her psychiatric meds and have referred her back to her ACT team.  She was given additional resources as well.    (Labs, imaging, consults)  Labs: I Ordered, and personally interpreted labs.  The pertinent results include:  CBC, CMP, Tylenol  level, salicylate level, ethanol  Imaging Studies ordered: I ordered imaging studies including N/A I independently visualized and interpreted imaging. I agree with the radiologist interpretation  Additional history obtained from chart review.  External records from outside source obtained and reviewed including [facility]  Cardiac Monitoring: The patient was not  maintained on a  cardiac monitor.  If on the cardiac monitor, I personally viewed and interpreted the cardiac monitored which showed an underlying rhythm of: N/A  Reevaluation: After the interventions noted above, I reevaluated the patient and found that they have :stayed the same  Social Determinants of Health:  lives independently  Disposition: Discharge  Co morbidities that complicate the patient evaluation  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    Chronic constipation    Depression    Drug-seeking behavior    Essential tremor    Headache    Ineffective individual coping 05/16/2022   Insomnia 01/12/2022   Intentional drug overdose (HCC) 06/05/2022   Neuromuscular disorder (HCC)    Normocytic anemia 06/05/2022   Overdose 07/22/2017   Overdose of acetaminophen  07/2017   and other meds   Overdose, intentional self-harm, initial encounter (HCC) 07/20/2021   Paranoia (HCC) 04/22/2021   Paroxysmal atrial fibrillation (HCC)    Personality disorder (HCC)    Prolonged QT interval    Purposeful non-suicidal drug ingestion (HCC) 06/27/2021   Schizo-affective psychosis (HCC)    Schizoaffective disorder (HCC) 07/29/2022   Schizoaffective disorder, bipolar type (HCC)    Seizures (HCC)    Last seizure December 2017   Skin erythema 04/27/2022   Sleep apnea    Suicidal behavior 07/25/2021   Suicidal ideation    Suicide (HCC) 07/01/2021   Suicide attempt (HCC) 07/04/2021     Medicines Meds ordered this encounter  Medications   benztropine  (COGENTIN ) 0.5 MG tablet    Sig: Take 1 tablet (0.5 mg total) by mouth 2 (two) times daily.    Dispense:  60 tablet    Refill:  0    gabapentin  (NEURONTIN ) 300 MG capsule    Sig: Take 1 capsule (300 mg total) by mouth daily in the afternoon.    Dispense:  30 capsule    Refill:  0   gabapentin  (NEURONTIN ) 600 MG tablet    Sig: Take 1 tablet (600 mg total) by mouth 2 (two) times daily.    Dispense:  60 tablet    Refill:  0   risperiDONE  (RISPERDAL ) 3 MG tablet    Sig: Take 1 tablet (3 mg total) by mouth 2 (two) times daily.    Dispense:  60 tablet    Refill:  0   sertraline  (ZOLOFT ) 100 MG tablet    Sig: Take 1.5 tablets (150 mg total) by mouth daily. Pt takes 1.5 tablets daily (150 mg)    Dispense:  45 tablet    Refill:  0    I have reviewed the patients home medicines and have made adjustments as needed  Problem List / ED Course: Problem List Items Addressed This Visit   None Visit Diagnoses       Suicidal ideation    -  Primary                Final diagnoses:  Suicidal ideation    ED Discharge Orders          Ordered    benztropine  (COGENTIN ) 0.5 MG tablet  2 times daily        10/19/23 2354    gabapentin  (NEURONTIN ) 300 MG capsule  Daily        10/19/23 2354    gabapentin  (NEURONTIN ) 600 MG tablet  2 times daily        10/19/23 2354    risperiDONE  (RISPERDAL ) 3 MG tablet  2 times daily        10/19/23 2354    sertraline  (ZOLOFT ) 100 MG tablet  Daily        10/19/23 2354               Evelyn Charmaine FALCON, MD 10/20/23 0000

## 2023-10-19 NOTE — ED Triage Notes (Signed)
 PER EMS: pt is from home with c/o anxiety, lack of appetite x 3 days and she reports she is out of her psych meds and has been denies refills. She states she feels depressed and has been having suicidal thoughts. Denies plan.  BP- 132/80, HR-87, 100% RA, RR-16, CBG-181

## 2023-10-19 NOTE — BH Assessment (Addendum)
 [facility] contacted @ [phone], spoke with [place]. Pt placed on the list to be seen.

## 2023-10-19 NOTE — ED Provider Triage Note (Signed)
 Emergency Medicine Provider Triage Evaluation Note  [name] , a 33 y.o. female  was evaluated in triage.  Pt complains of feeling depressed, being out of meds for a week ('my [facility] won't refill my meds'), decreased appetite, and passive SI. Denies desire or plan to harm self.   Review of Systems  Positive: Out of meds, feels depressed.  Negative: Denies ingestion. No headache. No cp or sob. No abd pain.   Physical Exam  BP (!) 126/59 (BP Location: Left Arm)   Pulse 98   Temp 98.9 F (37.2 C) (Oral)   Resp 17   Ht 1.651 m (5' 5)   Wt 104.3 kg   LMP  (LMP Unknown)   SpO2 100%   BMI 38.27 kg/m  Gen:   Awake, no distress   Resp:  Normal effort  Psych: Normal mood and affect. Pt is not responding to internal stimuli, no acute psychosis noted. Passive SI.    Medical Decision Making  Medically screening exam initiated at 8:46 PM.  Appropriate orders placed.  Lauraine Almarie Asters was informed that the remainder of the evaluation will be completed by another provider, this initial triage assessment does not replace that evaluation, and the importance of remaining in the ED until their evaluation is complete.     Bernard Drivers, MD 10/19/23 2048

## 2023-10-19 NOTE — ED Notes (Signed)
 TTS in process

## 2023-10-19 NOTE — ED Notes (Signed)
Dr. Steinl at bedside 

## 2023-10-19 NOTE — Consult Note (Addendum)
 [facility] Consult Note  Patient Name: [name] MRN: [id number] DOB: September 13, 1990 DATE OF Consult: 10/19/2023   TELEPSYCHIATRY ATTESTATION & CONSENT  As the provider for this telehealth consult, I attest that I verified the patient's identity using two separate identifiers, introduced myself to the patient, provided my credentials, disclosed my location, and performed this encounter via a HIPAA-compliant, real-time, face-to-face, two-way, interactive audio and video platform and with the full consent and agreement of the patient (or guardian as applicable.)  Patient physical location: [facility] . Telehealth provider physical location: home office in state of [place]  Video scheduled start [phone] (Central Time) Video end time: 1030 (Central Time)   PRIMARY PSYCHIATRIC DIAGNOSES (ICD-10 format preferred)  Borderline personality disorder  Malingering  Drug seeking  Non compliance ASD Chronic SI   RECOMMENDATIONS      Medication recommendations: resume your meds   Non-Medication/therapeutic recommendations: consider going to crisis center   There are no psychiatric contraindications to discharge at this time   Plan Post Discharge/Psychiatric Care Follow-up resources back to ACT team or crisis center   Follow-Up Telepsychiatry C/L services: We will sign off for now. Please re-consult our service if needed for any concerning changes in the patient's condition, discharge planning, or questions.  Communication:  Treatment team members (and family members if applicable) who  were involved in treatment/care discussions and planning, and with whom we spoke  or engaged with via secure text/chat, include the following:  ED attending / Bernard  Thank you for involving us  in the care of this patient. If you have any additional  questions or concerns, please call [phone] and ask for me or the provider on-call   CHIEF COMPLAINT/REASON FOR CONSULT  Depression SI  anxiety ; not taking her meds  HISTORY OF PRESENT ILLNESS (HPI)  The patient 33 yo depressed, anxiety , lack of appetite; ran out of psych meds No refills + SI + ASD/ Borderline personality disorder; schizoaffective disorder; yes past SA and past psych hospitals/ no intention or plan for suicide for this visit reported. Malingering has been noted in her chart  Past med list below   Interview shows   Patient cannot get refill on meds she claims . Patient then claims she ran out of her meds Then she says that she refuses to take long acting invega because the oral risperidone  works better . Inconsistent and as a historian not fully reliable When asking about suicide  No concrete plan to end her life no intention of death but she states she wants INPT. She claims past SA by cutting and overdose in the past NO  specific psychosocial stressor currently fueling her demise Has a child who is protective against suicide She claims she does not suffer from hallucinations  I am bipolar she claims ; also she claims to suffer from ADHD But no mania no hypomania   No severe depression no panic attacks  She knows her medications well asking for KLONOPIN  drug seeking  I do not see a recent KLONOPIN  RX in her hx as noted above No severe agitation  No aggression  No behavioral disruptions no outbursts Relatively calm and cooperative thus far Many psych hospital + plethora of ED visits this year to many different Eds    PAST PSYCHIATRIC HISTORY    Otherwise as per HPI above.  PAST MEDICAL HISTORY  Past Medical History:  Diagnosis Date   Acid reflux    Adjustment disorder with mixed anxiety and depressed mood 01/31/2022  Adjustment disorder with mixed disturbance of emotions and conduct 08/03/2019   Anxiety    Asthma    last attack 03/13/15 or 03/14/15   Autism    Bipolar 1 disorder, depressed, severe (HCC) 07/25/2021   Carrier of fragile X syndrome    Chronic constipation     Depression    Drug-seeking behavior    Essential tremor    Headache    Ineffective individual coping 05/16/2022   Insomnia 01/12/2022   Intentional drug overdose (HCC) 06/05/2022   Neuromuscular disorder (HCC)    Normocytic anemia 06/05/2022   Overdose 07/22/2017   Overdose of acetaminophen  07/2017   and other meds   Overdose, intentional self-harm, initial encounter (HCC) 07/20/2021   Paranoia (HCC) 04/22/2021   Paroxysmal atrial fibrillation (HCC)    Personality disorder (HCC)    Prolonged QT interval    Purposeful non-suicidal drug ingestion (HCC) 06/27/2021   Schizo-affective psychosis (HCC)    Schizoaffective disorder (HCC) 07/29/2022   Schizoaffective disorder, bipolar type (HCC)    Seizures (HCC)    Last seizure December 2017   Skin erythema 04/27/2022   Sleep apnea    Suicidal behavior 07/25/2021   Suicidal ideation    Suicide (HCC) 07/01/2021   Suicide attempt (HCC) 07/04/2021      HOME MEDICATIONS  (Not in a hospital admission)       ALLERGIES  Allergies  Allergen Reactions   Bee Venom Anaphylaxis   Coconut Flavoring Agent (Non-Screening) Anaphylaxis and Rash   Fish Allergy Anaphylaxis   Geodon  [Ziprasidone  Hcl] Other (See Comments)    Pt states that this medication causes paralysis of the mouth.     Haloperidol  And Related Other (See Comments)    Pt states that this medication causes paralysis of the mouth, jaw locks up   Lithobid [Lithium ] Other (See Comments)    Seizure-like activity    Roxicodone  [Oxycodone ] Other (See Comments)    Hallucinations    Seroquel  [Quetiapine ] Other (See Comments)    Severe drowsiness    Shellfish Allergy Anaphylaxis   Phenergan  [Promethazine  Hcl] Other (See Comments)    Chest pain     Prilosec [Omeprazole] Nausea And Vomiting and Other (See Comments)    Pt can take protonix  with no problems    Sulfa Antibiotics Other (See Comments)    Chest pain    Tegretol  [Carbamazepine ] Nausea And Vomiting   Prozac  [Fluoxetine ]  Other (See Comments)    Increased Depression and Suicidal thoughts   Tape Other (See Comments)    Skin tears, can only tolerate paper tape.   Tylenol  [Acetaminophen ] Rash and Other (See Comments)    Rash on face     SOCIAL & SUBSTANCE USE HISTORY  Social History   Socioeconomic History   Marital status: Widowed    Spouse name: Not on file   Number of children: 0   Years of education: Not on file   Highest education level: Not on file  Occupational History   Occupation: disability  Tobacco Use   Smoking status: Former    Types: Cigarettes   Smokeless tobacco: Never   Tobacco comments:    Smoked for 2  years age [age]  Vaping Use   Vaping status: Never Used  Substance and Sexual Activity   Alcohol  use: No    Alcohol /week: 1.0 standard drink of alcohol     Types: 1 Standard drinks or equivalent per week    Comment: denies at this time   Drug use: No    Comment:  History of cocaine use at age 24 for 4 months   Sexual activity: Not Currently    Birth control/protection: None  Other Topics Concern   Not on file  Social History Narrative   Marital status: Widowed      Children: daughter      Lives: with boyfriend, in two story home      Employment:  Disability      Tobacco: quit smoking; smoked for two years.      Alcohol  ;none      Drugs: none   Has not traveled outside of the country.   Right handed         Patient with hx of Fibromyalgia,Orthostatic Tachycardia,Asthma,Arthritis,Gerd, ASD   Social Drivers of Health   Financial Resource Strain: Not on file  Food Insecurity: No Food Insecurity (09/25/2023)   Hunger Vital Sign    Worried About Running Out of Food in the Last Year: Never true    Ran Out of Food in the Last Year: Never true  Transportation Needs: No Transportation Needs (09/25/2023)   PRAPARE - Administrator, Civil Service (Medical): No    Lack of Transportation (Non-Medical): No  Physical Activity: Not on file  Stress: Not on file  Social  Connections: Not on file   Denies substance abuse    FAMILY HISTORY  Family History  Problem Relation Age of Onset   Mental illness Father    Asthma Father    PDD Brother    Seizures Brother    Family Psychiatric History (if known):          MENTAL STATUS EXAM (MSE)  Mental Status Exam: General Appearance: Casual and Well Groomed  Orientation:  Full (Time, Place, and Person)  Memory:  intact  Concentration:  Concentration: Good  Recall:  Fair  Attention  Good  Eye Contact:  Fair  Speech:  Clear and Coherent  Language:  Fair  Volume:  Normal  Mood: anxious  Affect:  Appropriate and Congruent  Thought Process:  Coherent  Thought Content:  Negative  Suicidal Thoughts:  yes no plan no intention of death  Homicidal Thoughts:  No  Judgement:  Fair  Insight:  Fair  Psychomotor Activity:  Normal  Akathisia:  No  Fund of Knowledge:  Fair    Assets:  Communication Skills  Cognition:  WNL  ADL's:  Intact  AIMS (if indicated):          BP (!) 126/59 (BP Location: Left Arm)   Pulse 98   Temp 98.9 F (37.2 C) (Oral)   Resp 17   Ht 5' 5 (1.651 m)   Wt 104.3 kg   LMP  (LMP Unknown)   SpO2 100%   BMI 38.27 kg/m      LABS that are pertinent     ROS & ADDITIONAL FINDINGS  ROS: Notable for the following relevant positive findings: Psychiatric: .per hpi Other notable positive ROS findings: per hpi  Additional findings:      Musculoskeletal:    [x]  No Abnormal Movements Observed        []  Impaired      Gait & Station:        [x]  Normal        []  Wheelchair/Walker          []  Laying/Sitting       Pain Screening:   [x]  Denies    []  Present--mild to moderate     []  Present--severe (will  consider referral for ongoing evaluation and treatment)      Nutrition & Dental Concerns:no gross dental or  eating disorder   RISK ASSESSMENT*  Is the patient experiencing any suicidal or homicidal ideations:     [x] YES        []  NO       Explain if  yes: as per hpi Protective factors considered for safety management: daughter  Risk factors/concerns considered for safety management: (check all that apply) [x]  Prior attempt                                      []  Hopelessness       []  Family history of suicide                    []  Impulsivity [x]  Depression                                         []  Aggression []  Substance abuse/dependence          []  Isolation []  Physical illness/chronic pain              []  Barriers to accessing treatment []  Recent loss                                        []  Unwillingness to seek help []  Access to lethal means                      []  Female gender []  Age over 25                                        []  Unmarried  Is there a safety management plan with the patient and treatment team to minimize risk factors and promote protective factors:     [x]  YES      []  NO            Explain: routine nursing observation        Based on my current evaluation and risk assessment of the patient at the time of this encounter, this patient is considered to be at:   [x]    Low Risk                      [x]   Moderate Risk     for lethality                []   High Risk  *RISK ASSESSMENT Risk assessment is a dynamic process; it is possible that this patient's condition, and risk level, may change. This should be re-evaluated and managed over time as appropriate. Please re-consult psychiatric consult services if additional assistance is needed in terms of risk assessment and management. If your team decides to discharge this patient, please advise the patient how to best access emergency psychiatric services, or to call 911, if their condition worsens or they feel unsafe in any way.   CW Lonni Ivanoff, M. D., [facility] Services

## 2023-10-19 NOTE — Discharge Instructions (Signed)
 You are seen today for depression and suicidality.  You were given refills of your psychiatric medications.  You need to restart your medications and follow-up with your ACT team.  If it anytime you have new or worsening symptoms, you should be reevaluated.

## 2024-01-17 ENCOUNTER — Ambulatory Visit (HOSPITAL_COMMUNITY)
Admission: EM | Admit: 2024-01-17 | Discharge: 2024-01-18 | Disposition: A | Attending: Nurse Practitioner | Admitting: Nurse Practitioner

## 2024-01-17 DIAGNOSIS — E876 Hypokalemia: Secondary | ICD-10-CM | POA: Diagnosis not present

## 2024-01-17 DIAGNOSIS — F250 Schizoaffective disorder, bipolar type: Secondary | ICD-10-CM

## 2024-01-17 LAB — COMPREHENSIVE METABOLIC PANEL WITH GFR
ALT: 54 U/L — ABNORMAL HIGH (ref 0–44)
AST: 41 U/L (ref 15–41)
Albumin: 4.3 g/dL (ref 3.5–5.0)
Alkaline Phosphatase: 65 U/L (ref 38–126)
Anion gap: 13 (ref 5–15)
BUN: 5 mg/dL — ABNORMAL LOW (ref 6–20)
CO2: 25 mmol/L (ref 22–32)
Calcium: 8.8 mg/dL — ABNORMAL LOW (ref 8.9–10.3)
Chloride: 98 mmol/L (ref 98–111)
Creatinine, Ser: 0.82 mg/dL (ref 0.44–1.00)
GFR, Estimated: >60 mL/min (ref >60)
Glucose, Bld: 92 mg/dL (ref 70–99)
Potassium: 2.8 mmol/L — ABNORMAL LOW (ref 3.5–5.1)
Sodium: 136 mmol/L (ref 135–145)
Total Bilirubin: 0.9 mg/dL (ref 0.0–1.2)
Total Protein: 7.3 g/dL (ref 6.5–8.1)

## 2024-01-17 LAB — POCT URINE DRUG SCREEN - MANUAL ENTRY (I-SCREEN)
POC Amphetamine UR: NOT DETECTED
POC Buprenorphine (BUP): NOT DETECTED
POC Cocaine UR: NOT DETECTED
POC Marijuana UR: NOT DETECTED
POC Methadone UR: NOT DETECTED
POC Methamphetamine UR: NOT DETECTED
POC Morphine: NOT DETECTED
POC Oxazepam (BZO): NOT DETECTED
POC Oxycodone UR: NOT DETECTED
POC Secobarbital (BAR): NOT DETECTED

## 2024-01-17 LAB — CBC WITH DIFFERENTIAL/PLATELET
Abs Immature Granulocytes: 0.03 K/uL (ref 0.00–0.07)
Basophils Absolute: 0 K/uL (ref 0.0–0.1)
Basophils Relative: 1 %
Eosinophils Absolute: 0 K/uL (ref 0.0–0.5)
Eosinophils Relative: 1 %
HCT: 43.3 % (ref 36.0–46.0)
Hemoglobin: 14.3 g/dL (ref 12.0–15.0)
Immature Granulocytes: 0 %
Lymphocytes Relative: 25 %
Lymphs Abs: 2 K/uL (ref 0.7–4.0)
MCH: 25.9 pg — ABNORMAL LOW (ref 26.0–34.0)
MCHC: 33 g/dL (ref 30.0–36.0)
MCV: 78.4 fL — ABNORMAL LOW (ref 80.0–100.0)
Monocytes Absolute: 0.6 K/uL (ref 0.1–1.0)
Monocytes Relative: 7 %
Neutro Abs: 5.3 K/uL (ref 1.7–7.7)
Neutrophils Relative %: 66 %
Platelets: 272 K/uL (ref 150–400)
RBC: 5.52 MIL/uL — ABNORMAL HIGH (ref 3.87–5.11)
RDW: 15.4 % (ref 11.5–15.5)
WBC: 8 K/uL (ref 4.0–10.5)
nRBC: 0 % (ref 0.0–0.2)

## 2024-01-17 LAB — ETHANOL: Alcohol, Ethyl (B): <15 mg/dL (ref <15)

## 2024-01-17 LAB — POC URINE PREG, ED: Preg Test, Ur: NEGATIVE

## 2024-01-17 MED ORDER — MELATONIN 3 MG PO TABS: 3.0000 mg | ORAL_TABLET | Freq: Every evening | ORAL | Status: DC | PRN

## 2024-01-17 MED ORDER — OLANZAPINE 10 MG IM SOLR: 10.0000 mg | Freq: Three times a day (TID) | INTRAMUSCULAR | Status: DC | PRN

## 2024-01-17 MED ORDER — OLANZAPINE 5 MG PO TBDP: 5.0000 mg | ORAL_TABLET | Freq: Three times a day (TID) | ORAL | Status: DC | PRN

## 2024-01-17 MED ORDER — OLANZAPINE 10 MG IM SOLR: 5.0000 mg | Freq: Three times a day (TID) | INTRAMUSCULAR | Status: DC | PRN

## 2024-01-17 MED ORDER — ALUM & MAG HYDROXIDE-SIMETH 200-200-20 MG/5ML PO SUSP: 30.0000 mL | ORAL | Status: DC | PRN

## 2024-01-17 MED ORDER — GABAPENTIN 300 MG PO CAPS
600.0000 mg | ORAL_CAPSULE | Freq: Two times a day (BID) | ORAL | Status: DC
  Filled 2024-01-17 (×2): qty 2

## 2024-01-17 MED ORDER — RISPERIDONE 3 MG PO TABS
3.0000 mg | ORAL_TABLET | Freq: Two times a day (BID) | ORAL | Status: DC
  Filled 2024-01-17 (×2): qty 1

## 2024-01-17 MED ORDER — BENZTROPINE MESYLATE 0.5 MG PO TABS
0.5000 mg | ORAL_TABLET | Freq: Two times a day (BID) | ORAL | Status: DC
  Filled 2024-01-17 (×2): qty 1

## 2024-01-17 MED ORDER — PRAZOSIN HCL 2 MG PO CAPS
2.0000 mg | ORAL_CAPSULE | Freq: Every day | ORAL | Status: DC
  Filled 2024-01-17: qty 1

## 2024-01-17 MED ORDER — TRAZODONE HCL 50 MG PO TABS
50.0000 mg | ORAL_TABLET | Freq: Every evening | ORAL | Status: DC | PRN
  Filled 2024-01-17: qty 1
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
## 2024-01-17 NOTE — ED Notes (Signed)
 UTA patients mental state. She will only answer questions as they relate to her comfort (ie: food, pain). Patient refusing medications and was difficult to get onto unit because she would not cooperate. No acute distress noted. Active listening, support and encouragement provided. Routine safety checks conducted according to facility protocol. Encouraged patient to notify staff if thoughts of harm toward self or others arise.
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
## 2024-01-18 ENCOUNTER — Emergency Department (HOSPITAL_COMMUNITY): Admission: EM | Admit: 2024-01-18 | Discharge: 2024-01-18

## 2024-01-18 ENCOUNTER — Ambulatory Visit (HOSPITAL_COMMUNITY)
Admission: EM | Admit: 2024-01-18 | Discharge: 2024-01-21 | Disposition: A | Source: Intra-hospital | Attending: Urology | Admitting: Urology

## 2024-01-18 ENCOUNTER — Other Ambulatory Visit: Payer: Self-pay

## 2024-01-18 DIAGNOSIS — F250 Schizoaffective disorder, bipolar type: Secondary | ICD-10-CM | POA: Insufficient documentation

## 2024-01-18 DIAGNOSIS — F4310 Post-traumatic stress disorder, unspecified: Secondary | ICD-10-CM | POA: Insufficient documentation

## 2024-01-18 DIAGNOSIS — E876 Hypokalemia: Secondary | ICD-10-CM | POA: Insufficient documentation

## 2024-01-18 DIAGNOSIS — F603 Borderline personality disorder: Secondary | ICD-10-CM

## 2024-01-18 DIAGNOSIS — Z91148 Patient's other noncompliance with medication regimen for other reason: Secondary | ICD-10-CM | POA: Insufficient documentation

## 2024-01-18 DIAGNOSIS — F251 Schizoaffective disorder, depressive type: Secondary | ICD-10-CM | POA: Diagnosis present

## 2024-01-18 DIAGNOSIS — F411 Generalized anxiety disorder: Secondary | ICD-10-CM | POA: Insufficient documentation

## 2024-01-18 DIAGNOSIS — R451 Restlessness and agitation: Secondary | ICD-10-CM | POA: Insufficient documentation

## 2024-01-18 LAB — CBC WITH DIFFERENTIAL/PLATELET
Abs Immature Granulocytes: 0.02 K/uL (ref 0.00–0.07)
Basophils Absolute: 0.1 K/uL (ref 0.0–0.1)
Basophils Relative: 1 %
Eosinophils Absolute: 0 K/uL (ref 0.0–0.5)
Eosinophils Relative: 1 %
HCT: 40.1 % (ref 36.0–46.0)
Hemoglobin: 13.2 g/dL (ref 12.0–15.0)
Immature Granulocytes: 0 %
Lymphocytes Relative: 33 %
Lymphs Abs: 2.1 K/uL (ref 0.7–4.0)
MCH: 25.9 pg — ABNORMAL LOW (ref 26.0–34.0)
MCHC: 32.9 g/dL (ref 30.0–36.0)
MCV: 78.6 fL — ABNORMAL LOW (ref 80.0–100.0)
Monocytes Absolute: 0.5 K/uL (ref 0.1–1.0)
Monocytes Relative: 9 %
Neutro Abs: 3.6 K/uL (ref 1.7–7.7)
Neutrophils Relative %: 56 %
Platelets: 233 K/uL (ref 150–400)
RBC: 5.1 MIL/uL (ref 3.87–5.11)
RDW: 15.3 % (ref 11.5–15.5)
WBC: 6.4 K/uL (ref 4.0–10.5)
nRBC: 0 % (ref 0.0–0.2)

## 2024-01-18 LAB — COMPREHENSIVE METABOLIC PANEL WITH GFR
ALT: 48 U/L — ABNORMAL HIGH (ref 0–44)
AST: 34 U/L (ref 15–41)
Albumin: 3.7 g/dL (ref 3.5–5.0)
Alkaline Phosphatase: 62 U/L (ref 38–126)
Anion gap: 11 (ref 5–15)
BUN: <5 mg/dL — ABNORMAL LOW (ref 6–20)
CO2: 24 mmol/L (ref 22–32)
Calcium: 8.7 mg/dL — ABNORMAL LOW (ref 8.9–10.3)
Chloride: 102 mmol/L (ref 98–111)
Creatinine, Ser: 0.75 mg/dL (ref 0.44–1.00)
GFR, Estimated: >60 mL/min (ref >60)
Glucose, Bld: 119 mg/dL — ABNORMAL HIGH (ref 70–99)
Potassium: 2.9 mmol/L — ABNORMAL LOW (ref 3.5–5.1)
Sodium: 137 mmol/L (ref 135–145)
Total Bilirubin: 0.8 mg/dL (ref 0.0–1.2)
Total Protein: 6.5 g/dL (ref 6.5–8.1)

## 2024-01-18 LAB — TSH: TSH: 1.214 u[IU]/mL (ref 0.350–4.500)

## 2024-01-18 LAB — ACETAMINOPHEN LEVEL: Acetaminophen (Tylenol), Serum: <10 ug/mL — ABNORMAL LOW (ref 10–30)

## 2024-01-18 LAB — MAGNESIUM: Magnesium: 1.7 mg/dL (ref 1.7–2.4)

## 2024-01-18 LAB — SALICYLATE LEVEL: Salicylate Lvl: <7 mg/dL — ABNORMAL LOW (ref 7.0–30.0)

## 2024-01-18 MED ORDER — OLANZAPINE 10 MG IM SOLR
10.0000 mg | Freq: Once | INTRAMUSCULAR | Status: AC
  Administered 2024-01-18: 10 mg via INTRAMUSCULAR
  Filled 2024-01-18: qty 10

## 2024-01-18 MED ORDER — TRAZODONE HCL 50 MG PO TABS: 50.0000 mg | ORAL_TABLET | Freq: Every evening | ORAL | Status: DC | PRN

## 2024-01-18 MED ORDER — POTASSIUM CHLORIDE CRYS ER 20 MEQ PO TBCR: 30.0000 meq | EXTENDED_RELEASE_TABLET | Freq: Once | ORAL | Status: DC

## 2024-01-18 MED ORDER — POTASSIUM CHLORIDE CRYS ER 20 MEQ PO TBCR: 40.0000 meq | EXTENDED_RELEASE_TABLET | Freq: Once | ORAL | Status: DC

## 2024-01-18 MED ORDER — SERTRALINE HCL 100 MG PO TABS
100.0000 mg | ORAL_TABLET | Freq: Every day | ORAL | Status: DC
  Filled 2024-01-18: qty 1

## 2024-01-18 MED ORDER — OLANZAPINE 10 MG IM SOLR
10.0000 mg | Freq: Three times a day (TID) | INTRAMUSCULAR | Status: DC | PRN
  Filled 2024-01-18: qty 10

## 2024-01-18 MED ORDER — MAGNESIUM HYDROXIDE 400 MG/5ML PO SUSP: 30.0000 mL | Freq: Every day | ORAL | Status: DC | PRN

## 2024-01-18 MED ORDER — MAGNESIUM OXIDE -MG SUPPLEMENT 400 (240 MG) MG PO TABS: 400.0000 mg | ORAL_TABLET | Freq: Once | ORAL | Status: DC

## 2024-01-18 MED ORDER — OLANZAPINE 5 MG PO TBDP: 5.0000 mg | ORAL_TABLET | Freq: Three times a day (TID) | ORAL | Status: DC | PRN

## 2024-01-18 MED ORDER — HYDROXYZINE HCL 25 MG PO TABS: 25.0000 mg | ORAL_TABLET | Freq: Three times a day (TID) | ORAL | Status: DC | PRN

## 2024-01-18 MED ORDER — ALUM & MAG HYDROXIDE-SIMETH 200-200-20 MG/5ML PO SUSP: 30.0000 mL | ORAL | Status: DC | PRN

## 2024-01-18 MED ORDER — POTASSIUM CHLORIDE CRYS ER 20 MEQ PO TBCR: 40.0000 meq | EXTENDED_RELEASE_TABLET | Freq: Every day | ORAL | 0 refills | Status: AC

## 2024-01-18 MED ORDER — POTASSIUM CHLORIDE CRYS ER 20 MEQ PO TBCR
40.0000 meq | EXTENDED_RELEASE_TABLET | Freq: Every day | ORAL | Status: DC
  Filled 2024-01-18 (×2): qty 2
  Filled 2024-01-18: qty 4

## 2024-01-18 MED ORDER — OLANZAPINE 10 MG IM SOLR
5.0000 mg | Freq: Three times a day (TID) | INTRAMUSCULAR | Status: DC | PRN
  Administered 2024-01-19: 5 mg via INTRAMUSCULAR
  Filled 2024-01-18: qty 10

## 2024-01-18 MED ORDER — POTASSIUM CHLORIDE CRYS ER 20 MEQ PO TBCR
40.0000 meq | EXTENDED_RELEASE_TABLET | Freq: Two times a day (BID) | ORAL | Status: DC
  Filled 2024-01-18: qty 2

## 2024-01-18 NOTE — ED Provider Notes (Addendum)
 [place] [facility] DEPARTMENT AT [facility] Provider Note   CSN: [id number] Arrival date & time: 01/18/24  1449     Patient presents with: Abnormal Lab   [name] is a 33 y.o. female.    Abnormal Lab  Presents because of abnormal lab.  She states that she has no complaints at this time.  She states that she just want to go home.  Otherwise, she will not elaborate on other symptoms     Previous medical history reviewed : psychiatric history of schizoaffective d/o bipolar type, PTSD, borderline personality disorder, GAD via [employer] under IVC.  Patient currently IVC at behavioral health.  Patient was sent here from behavior health due to potassium of 2.8.  Pregnancy negative yesterday.   Prior to Admission medications  Medication Sig Start Date End Date Taking? Authorizing Provider  potassium chloride  SA (KLOR-CON  M) 20 MEQ tablet Take 2 tablets (40 mEq total) by mouth daily. 01/18/24 01/25/24 Yes Simon Lavonia SAILOR, MD  benztropine  (COGENTIN ) 0.5 MG tablet Take 1 tablet (0.5 mg total) by mouth 2 (two) times daily. 10/19/23   Horton, Charmaine FALCON, MD  gabapentin  (NEURONTIN ) 300 MG capsule Take 1 capsule (300 mg total) by mouth daily in the afternoon. 10/19/23   Horton, Charmaine FALCON, MD  gabapentin  (NEURONTIN ) 600 MG tablet Take 1 tablet (600 mg total) by mouth 2 (two) times daily. 10/19/23   Horton, Charmaine FALCON, MD  Melatonin 3 MG CAPS Take 3 mg by mouth at bedtime as needed (sleep).    [provider]  pantoprazole  (PROTONIX ) 40 MG tablet Take 40 mg by mouth daily.    [provider]  prazosin  (MINIPRESS ) 2 MG capsule Take 2 mg by mouth at bedtime.    [provider]  risperiDONE  (RISPERDAL ) 3 MG tablet Take 1 tablet (3 mg total) by mouth 2 (two) times daily. 10/19/23   Horton, Charmaine FALCON, MD  sertraline  (ZOLOFT ) 100 MG tablet Take 1.5 tablets (150 mg total) by mouth daily. Pt takes 1.5 tablets daily (150 mg) 10/19/23   Horton, Charmaine FALCON, MD     Allergies: Bee venom, Coconut flavoring agent (non-screening), Fish allergy, Geodon  [ziprasidone  hcl], Haloperidol  and related, Lithobid [lithium ], Roxicodone  [oxycodone ], Seroquel  [quetiapine ], Shellfish allergy, Phenergan  [promethazine  hcl], Prilosec [omeprazole], Sulfa antibiotics, Tegretol  [carbamazepine ], Prozac  [fluoxetine ], Tape, and Tylenol  [acetaminophen ]    Review of Systems  Constitutional:  Negative for chills and fever.  HENT:  Negative for ear pain and sore throat.   Eyes:  Negative for pain and visual disturbance.  Respiratory:  Negative for cough and shortness of breath.   Cardiovascular:  Negative for chest pain and palpitations.  Gastrointestinal:  Negative for abdominal pain and vomiting.  Genitourinary:  Negative for dysuria and hematuria.  Musculoskeletal:  Negative for arthralgias and back pain.  Skin:  Negative for color change and rash.  Neurological:  Negative for seizures and syncope.  All other systems reviewed and are negative.   Updated Vital Signs BP 129/87 (BP Location: Left Wrist)   Pulse (!) 110   Temp 98.5 F (36.9 C) (Oral)   Resp 20   SpO2 100%   Physical Exam Vitals and nursing note reviewed.  Constitutional:      General: She is not in acute distress.    Appearance: She is well-developed.  HENT:     Head: Normocephalic and atraumatic.  Eyes:     Conjunctiva/sclera: Conjunctivae normal.  Cardiovascular:     Rate and Rhythm: Normal rate and regular rhythm.  Heart sounds: No murmur heard. Pulmonary:     Effort: Pulmonary effort is normal. No respiratory distress.     Breath sounds: Normal breath sounds.  Abdominal:     Palpations: Abdomen is soft.     Tenderness: There is no abdominal tenderness.  Musculoskeletal:        General: No swelling.     Cervical back: Neck supple.  Skin:    General: Skin is warm and dry.     Capillary Refill: Capillary refill takes less than 2 seconds.  Neurological:     Mental Status: She is  alert.  Psychiatric:        Mood and Affect: Mood normal.     (all labs ordered are listed, but only abnormal results are displayed) Labs Reviewed  CBC WITH DIFFERENTIAL/PLATELET - Abnormal; Notable for the following components:      Result Value   MCV 78.6 (*)    MCH 25.9 (*)    All other components within normal limits  COMPREHENSIVE METABOLIC PANEL WITH GFR - Abnormal; Notable for the following components:   Potassium 2.9 (*)    Glucose, Bld 119 (*)    BUN <5 (*)    Calcium  8.7 (*)    ALT 48 (*)    All other components within normal limits  SALICYLATE LEVEL - Abnormal; Notable for the following components:   Salicylate Lvl <7.0 (*)    All other components within normal limits  ACETAMINOPHEN  LEVEL - Abnormal; Notable for the following components:   Acetaminophen  (Tylenol ), Serum <10 (*)    All other components within normal limits  MAGNESIUM   TSH    EKG: EKG Interpretation Date/Time:  Saturday January 18 2024 14:59:59 EST Ventricular Rate:  104 PR Interval:  120 QRS Duration:  84 QT Interval:  368 QTC Calculation: 483 R Axis:   79  Text Interpretation: Sinus tachycardia Otherwise normal ECG When compared with ECG of 17-Jan-2024 22:06, PREVIOUS ECG IS PRESENT Confirmed by Simon Rea [phone]) on 01/18/2024 3:12:34 PM  Radiology: No results found.   Procedures   Medications Ordered in the ED  potassium chloride  (KLOR-CON  M) CR tablet 30 mEq (has no administration in time range)  magnesium  oxide (MAG-OX) tablet 400 mg (has no administration in time range)  OLANZapine  (ZYPREXA ) injection 10 mg (10 mg Intramuscular Given 01/18/24 1930)    Clinical Course as of 01/18/24 2013  Sat Jan 18, 2024  2013 [place]  [TL]    Clinical Course User Index [TL] Simon Rea SAILOR, MD                                 Medical Decision Making Amount and/or Complexity of Data Reviewed Labs: ordered.  Risk OTC drugs. Prescription drug management.     HPI:   Presents  because of abnormal lab.  She states that she has no complaints at this time.  She states that she just want to go home.  Otherwise, she will not elaborate on other symptoms     Previous medical history reviewed : psychiatric history of schizoaffective d/o bipolar type, PTSD, borderline personality disorder, GAD via [employer] under IVC.  Patient currently IVC at behavioral health.  Patient was sent here from behavior health due to potassium of 2.8.  Pregnancy negative yesterday.  MDM:    Upon reexamination, patient hemodynamically stable. A&O x 3 with GCS 15 though somewhat combative at times.  Patient would not elaborate on  her symptoms.  Initially would not allow us  to get labs.  Explained to the patient that she is currently under IVC and we are legally obliged to make sure she is medically clear.  Subsequently get EKG as well as labs.  EKG sinus rhythm.  Labs shows potassium 2.9.  Mag of 1.7.  Negative salicylate and Tylenol  levels.  Would be tachycardic at times because agitation  She did become acutely agitated at 1 point was subsequently had received 10 mg of Zyprexa .  For the potassium, can take p.o. potassium at her facility.  Is no indication to keep her here for this.  No arrhythmia.  Will give 1 dose of magnesium  oxide tablet here as well.  Think taking 40 mill equivalents of potassium daily for the next week is appropriate.  She can have repeat labs at her facility.  There is no medical reason to keep her here at this point time.  Will transfer back to [facility]    EKG Interpreted by Me: sinus       Final diagnoses:  Agitation  Hypokalemia    ED Discharge Orders          Ordered    potassium chloride  SA (KLOR-CON  M) 20 MEQ tablet  Daily        01/18/24 2012               Simon Lavonia SAILOR, MD 01/18/24 2013    Simon Lavonia SAILOR, MD 01/18/24 2038

## 2024-01-18 NOTE — ED Notes (Signed)
 The patient arrived from [facility] with [employer] and IVC documents in hand.  Per the [facility] officer, [facility] only provided 2 copies of the IVC document which was verified in front of the officer.  A third copy was made to complete the set.  The findings and custody order along with the first exam and affidavit and petition was uploaded to the patient's chart.  A case number will obtained with the [facility] of Courts on Monday, 01-20-2024.  A copy was placed in the medical records drawer and a copy placed in the Magistrate's folder

## 2024-01-18 NOTE — ED Notes (Signed)
 Patient non compliant and refused to take medications also refused to let staff obtain VS.

## 2024-01-18 NOTE — ED Notes (Signed)
 Report given to charge nurse Jol[facility] ED

## 2024-01-18 NOTE — ED Triage Notes (Signed)
 Patient under IVC arrives with [employer] from [facility] for medical clearance. Reported K of 2.8 yesterday. Patient refusing to answer any questions during triage.
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
## 2024-01-18 NOTE — ED Notes (Signed)
 [facility] called and dispatch to send EMS and [employer].

## 2024-01-18 NOTE — ED Notes (Signed)
Patient sleeping with no s/s of distress.

## 2024-01-18 NOTE — ED Notes (Signed)
 Patient arrived from [facility] wearing scrub top and bottom.  Patient appears disoriented at this time, speaking loud, and also irritable.  It could not be assessed if she is currently experiencing SI/HI/AVH at this time.  Patient was irritable during skin assessment, and had to be redirected multiple times while refusing to do skin assessment initially.  Patient eventually calmed down and escorted to the obs. Unit.  Patient was offered food and drink during shift.  No further issues noted.  Patient will continue Q 15 min safety checks for safety/behavior per facility protocol.

## 2024-01-18 NOTE — ED Notes (Signed)
 PT refusing labs at this time, physician notified and meds put into place to help facilitate getting labs at this time.

## 2024-01-18 NOTE — ED Notes (Addendum)
 Patient refused to get up for vitals and for the provider, she pretended to be asleep after being asked to get up numerous times, the patient was blinking but refused to respond to the witness or the provider.

## 2024-01-18 NOTE — Progress Notes (Signed)
 Patient has been denied by [facility] due to no appropriate beds available. Patient meets [facility] inpatient criteria per Ismael Franco, MD. Patient has been faxed out to the following facilities:   [facility]  [address] [place] [address] [phone]  [facility]  [address] [place] [address] [phone]  [facility]  [address] [place] [address] [phone]  [name]  [address] [place], [place] [address] [phone]  [facility] Patient Placement  [facility], [place] [phone]  [facility]  [address] [place] [address] [phone]  [facility]  [address] [place] [address] [phone]  [facility]  [address] [place] [address] [phone]  [facility]  [address] [place] [address] [phone]  [facility]  [address] [place] [phone]  [facility]  [address] [place], [place] [address] [phone]  [facility]  [address] [place] [address] [phone]  [facility]  [address] [place] [address] [phone]  [facility]  [address] [place] [address] [phone]  [facility]  [address], [place] [address] [phone]  [facility]  [address], [place] [address] [phone]  [facility]  [address] [place] [address]  [phone]  [facility] [address]  [place] [address] [phone]  [facility]  [address] [facility] [place] [address] [phone]    Bunnie Gallop, MSW, LCSW-A  11:21 AM 01/18/2024

## 2024-01-18 NOTE — ED Notes (Signed)
 Patient calm, resting with no s/s of distress

## 2024-01-18 NOTE — ED Provider Notes (Signed)
 Patient was sent to the ED earlier due to abnormal labs and has now returned in stable condition. She is alert and oriented x4, somewhat agitated and minimally interactive. She denies chest pain, palpitations, or dizziness. ED labs showed potassium at 2.9 and magnesium  at 1.7, negative salicylate and Tylenol  levels. Per ED provider, potassium 40 mEq daily for 1 week is recommended.

## 2024-01-18 NOTE — ED Notes (Signed)
 PT stuck by another RN but blood hemolyzed, will redraw
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
## 2024-01-18 NOTE — ED Notes (Signed)
 Pt resting eyes closed. RR even and unlabored. Environment secured. Will continue to monitor for safety.

## 2024-01-18 NOTE — ED Notes (Signed)
[facility] called for transport to [facility]. 

## 2024-01-19 MED ORDER — SERTRALINE HCL 50 MG PO TABS
150.0000 mg | ORAL_TABLET | Freq: Every day | ORAL | Status: DC
  Filled 2024-01-19 (×3): qty 1

## 2024-01-19 MED ORDER — PRAZOSIN HCL 2 MG PO CAPS
2.0000 mg | ORAL_CAPSULE | Freq: Every day | ORAL | Status: DC
  Filled 2024-01-19: qty 1

## 2024-01-19 MED ORDER — GABAPENTIN 300 MG PO CAPS
600.0000 mg | ORAL_CAPSULE | Freq: Two times a day (BID) | ORAL | Status: DC
  Filled 2024-01-19 (×4): qty 2

## 2024-01-19 MED ORDER — PANTOPRAZOLE SODIUM 40 MG PO TBEC
40.0000 mg | DELAYED_RELEASE_TABLET | Freq: Every day | ORAL | Status: DC
  Filled 2024-01-19 (×3): qty 1

## 2024-01-19 MED ORDER — MELATONIN 3 MG PO TABS: 3.0000 mg | ORAL_TABLET | Freq: Every evening | ORAL | Status: DC | PRN

## 2024-01-19 MED ORDER — GABAPENTIN 300 MG PO CAPS: 300.0000 mg | ORAL_CAPSULE | Freq: Every day | ORAL | Status: DC

## 2024-01-19 MED ORDER — BENZTROPINE MESYLATE 0.5 MG PO TABS
0.5000 mg | ORAL_TABLET | Freq: Two times a day (BID) | ORAL | Status: DC
  Filled 2024-01-19 (×4): qty 1

## 2024-01-19 MED ORDER — RISPERIDONE 3 MG PO TABS
3.0000 mg | ORAL_TABLET | Freq: Two times a day (BID) | ORAL | Status: DC
  Filled 2024-01-19 (×4): qty 1

## 2024-01-19 NOTE — ED Notes (Signed)
 Pt resting quietly with eyes closed.  No pain or discomfort noted/voiced.  Breathing is even and unlabored. Pt allowed staff to get updated BP but yelled at staff when asked if she would be willing to take her medications.  Will continue to monitor for safety.
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
## 2024-01-19 NOTE — Progress Notes (Addendum)
 Patient is currently resting at this time.  No acute distress noted.  Respirations present, even and unlabored.  No current issues noted, however has been uncooperative with care.  Will continue to provide care and the importance of treatment while continuing Q 15 min safety checks for safety/behavior per facility protocol.

## 2024-01-19 NOTE — ED Notes (Signed)
 Pt is sleeping at the moment. No acute distress noted. Q15 safety checks in place.

## 2024-01-19 NOTE — Progress Notes (Signed)
 [facility] has refused the patient due to acuity. CSW will continue to seek recommended disposition.   Bunnie Gallop, MSW, LCSW-A  2:39 PM 01/19/2024

## 2024-01-19 NOTE — ED Notes (Signed)
 Patient alert and oriented.  Denies HI, AVH, and pain. Patient has refused scheduled medications and PO potassium.  Asked if pt would take PO potassium in liquid form she declined.  Pt reports she hasn't eaten since October.  States doesn't want to take her medications because she hasn't eaten and is worried she will throw up.  Pt reports she is thirsty but refused juice when offered.  Pt reports passive SI when asked about a plan pt stated she did not have one at this time.  Contracts for safety while on the unit. Support and encouragement provided.  Routine safety checks conducted every 15 min.  Patient informed to notify staff with problems or concerns. No adverse drug reactions noted. Patient receptive and calm.   Patient remains safe at this time.

## 2024-01-19 NOTE — ED Notes (Signed)
Pt refused lunch

## 2024-01-19 NOTE — ED Provider Notes (Cosign Needed Addendum)
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  Recall: Poor  Fund of Knowledge: Poor  Language: Poor   Psychomotor Activity  Psychomotor Activity: Psychomotor Activity: Normal   Assets  Assets: Resilience   Sleep  Sleep: Sleep: Fair  No Safety Checks orders active in given range  No data recorded  Physical Exam  Physical Exam Vitals and nursing note reviewed.  Eyes:     Pupils: Pupils are equal, round, and reactive to light.  Musculoskeletal:        General: Normal range of motion.     Cervical back: Normal range of motion.    Review of Systems  Psychiatric/Behavioral:  Positive for depression, hallucinations and suicidal ideas. Negative for memory loss and substance abuse. The patient is nervous/anxious and has insomnia.   All other systems reviewed and are negative.  Blood pressure (!) 99/54, pulse 70, temperature 97.9 F (36.6 C), resp. rate 18, SpO2 99%. There is no height or weight on file to calculate BMI.  Treatment Plan Summary: Current Medications: Daily contact with patient to assess and evaluate symptoms and progress in treatment and Medication management  benztropine   0.5 mg Oral BID   gabapentin   300 mg Oral Q1500   gabapentin   600 mg Oral BID   pantoprazole   40 mg Oral Daily   potassium chloride   40 mEq Oral Daily   prazosin   2 mg Oral QHS   risperiDONE   3 mg Oral BID   sertraline   150 mg Oral Daily   PRNS  alum & mag hydroxide-simeth, hydrOXYzine , magnesium  hydroxide, melatonin, OLANZapine , OLANZapine , OLANZapine  zydis, traZODone   Donia Snell, NP 01/19/2024 6:16 PM

## 2024-01-19 NOTE — ED Notes (Signed)
Patient refused vitals, RN notified.

## 2024-01-19 NOTE — Progress Notes (Signed)
 CSW sent [facility] referral manually to multiple facilities. CSW will continue to monitor the patient to secure recommended disposition.    Bunnie Gallop, MSW, LCSW-A  3:03 PM 01/19/2024

## 2024-01-19 NOTE — ED Notes (Signed)
 Pt requested Orange Juice.  Juice provided

## 2024-01-19 NOTE — Progress Notes (Signed)
 Patient has been denied by [facility] due to no appropriate beds available. Patient meets [facility] inpatient criteria per Ismael Franco, MD. Patient has been faxed out to the following facilities:   [facility]  [address] [place] [address] [phone]  [facility]  [address] [place] [address] [phone]  [facility]  [address] [place], [place] [address] [phone]  [facility] Patient Placement  [facility], [place] [phone]  [facility]  [address] [place] [address] [phone]  [facility]  [address] [place] [address] [phone]  [facility] EFAX  [address] [place], [place] [phone]  [facility]  [address] [place] [address] [phone]  [facility]  [address], [place] [address] [phone]  [facility]  [address] [place], [place] [address] [phone]  [facility]  [address] [place] [address] [phone]  [facility]  [address] [place] [address] [phone]  [facility]  [address] [place] [address] [phone]  [facility]  [address], [place] [address] [phone]  [facility]  [address], [place] [address] [phone]  [facility]  [address] [place] [address] [phone]  [facility]  [address]  [place] [address] [phone]    Bunnie Gallop, MSW, LCSW-A  2:29 PM 01/19/2024

## 2024-01-19 NOTE — ED Notes (Signed)
 Pt took injection without issue will continue to monitor for safety

## 2024-01-20 NOTE — ED Notes (Signed)
 Pt refuses vitals signs, refuses to be assessed by the RN, refuses meal nd medication as well.

## 2024-01-20 NOTE — ED Notes (Signed)
 Pt A&O x4, no distress noted, pt remains irritable and resistant to care.  Pt refused vital signs.  Monitoring for safety.

## 2024-01-20 NOTE — ED Notes (Signed)
 Pt sleeping at present, no distress noted.  Monitoring for safety.

## 2024-01-20 NOTE — ED Notes (Signed)
 Pt is sleeping at this moment. Respirations are  even and labored. No acute distress noted.

## 2024-01-20 NOTE — ED Notes (Signed)
 Patient stated she would take her medication after eating some of her salad and Ensure but when medication was brought to her, patient refused all scheduled medication once again. Medication explanation provided to patient but patient continued to refused stating Im not taking this  I don't want it.

## 2024-01-20 NOTE — ED Notes (Signed)
 Pt continues to refuse scheduled meds, resistant to care.

## 2024-01-20 NOTE — ED Provider Notes (Cosign Needed)
 Behavioral Health Progress Note  Date and Time: 01/20/2024 3:57 PM Name: [name] MRN:  [id number]  HPI: Per initial assessment: [name]  is a 33 y.o. year-old female with a psychiatric history of schizoaffective d/o bipolar type, PTSD, borderline personality disorder, GAD via GPD under IVC. Patient was petitioned tonight by her boyfriend Lynwood Mantel [phone]. Mr. Mantel reports that patient has not been taking her medications since June 2025. Mr. Mantel stated he took patient to [facility] to get her IM injection but patient refused to the injection when they arrived to clinic. He is not sure of the names of her medications. Illona, Ismael NOVAK, MD, Date of Service: 01/18/2024  8:37 AM).  Patient assessment, 01/20/2024: Patient is seen lying in bed in the observation area, pulls covers over her head when writer interviews on first occasion. When reassessed later, patient remains uncooperative.  Stop pressuring me Has taken in ensure beverages and eaten some salad. Otherwise minimal content. Likely to overturn IVC and discharge on 12/16.  No PRNs this shift. Refusing all meds.  Diagnosis:  Final diagnoses:  Schizoaffective disorder, depressive type (HCC)    Total Time spent with patient: 45 minutes  Past Psychiatric History: Schizoaffective disorder Past Medical History: GERD Family History: Not provided Family Psychiatric  History: Not provided Social History: Past Psychiatric History: schizoaffective disorder; most recent hospitalization was 07/2023 per chart review; most recent medication list found: benztropine  0.5 MG tablet 2 times daily, gabapentin  300 MG capsule Daily, gabapentin  600 MG tablet 2 times daily, risperiDONE  3 MG tablet 2 times daily, sertraline  100 MG tablet Daily       Sleep: Fair  Appetite:  Fair  Current Medications:  Current Facility-Administered Medications  Medication Dose Route Frequency Provider Last Rate Last Admin    alum & mag hydroxide-simeth (MAALOX/MYLANTA) 200-200-20 MG/5ML suspension 30 mL  30 mL Oral Q4H PRN Ajibola, Ene A, NP       benztropine  (COGENTIN ) tablet 0.5 mg  0.5 mg Oral BID Ajibola, Ene A, NP       gabapentin  (NEURONTIN ) capsule 300 mg  300 mg Oral Q1500 Ajibola, Ene A, NP       gabapentin  (NEURONTIN ) capsule 600 mg  600 mg Oral BID Ajibola, Ene A, NP       hydrOXYzine  (ATARAX ) tablet 25 mg  25 mg Oral TID PRN Ajibola, Ene A, NP       magnesium  hydroxide (MILK OF MAGNESIA) suspension 30 mL  30 mL Oral Daily PRN Ajibola, Ene A, NP       melatonin tablet 3 mg  3 mg Oral QHS PRN Ajibola, Ene A, NP       OLANZapine  (ZYPREXA ) injection 10 mg  10 mg Intramuscular TID PRN Ajibola, Ene A, NP       OLANZapine  (ZYPREXA ) injection 5 mg  5 mg Intramuscular TID PRN Ajibola, Ene A, NP   5 mg at 01/19/24 1012   OLANZapine  zydis (ZYPREXA ) disintegrating tablet 5 mg  5 mg Oral TID PRN Ajibola, Ene A, NP       pantoprazole  (PROTONIX ) EC tablet 40 mg  40 mg Oral Daily Ajibola, Ene A, NP       potassium chloride  SA (KLOR-CON  M) CR tablet 40 mEq  40 mEq Oral Daily Ajibola, Ene A, NP       prazosin  (MINIPRESS ) capsule 2 mg  2 mg Oral QHS Ajibola, Ene A, NP       risperiDONE  (RISPERDAL ) tablet 3 mg  3  mg Oral BID Ajibola, Ene A, NP       sertraline  (ZOLOFT ) tablet 150 mg  150 mg Oral Daily Ajibola, Ene A, NP       traZODone  (DESYREL ) tablet 50 mg  50 mg Oral QHS PRN Ajibola, Ene A, NP       Current Outpatient Medications  Medication Sig Dispense Refill   benztropine  (COGENTIN ) 0.5 MG tablet Take 1 tablet (0.5 mg total) by mouth 2 (two) times daily. 60 tablet 0   gabapentin  (NEURONTIN ) 300 MG capsule Take 1 capsule (300 mg total) by mouth daily in the afternoon. 30 capsule 0   gabapentin  (NEURONTIN ) 600 MG tablet Take 1 tablet (600 mg total) by mouth 2 (two) times daily. 60 tablet 0   Melatonin 3 MG CAPS Take 3 mg by mouth at bedtime as needed (sleep).     pantoprazole  (PROTONIX ) 40 MG tablet Take 40 mg by  mouth daily.     potassium chloride  SA (KLOR-CON  M) 20 MEQ tablet Take 2 tablets (40 mEq total) by mouth daily. 7 tablet 0   prazosin  (MINIPRESS ) 2 MG capsule Take 2 mg by mouth at bedtime.     risperiDONE  (RISPERDAL ) 3 MG tablet Take 1 tablet (3 mg total) by mouth 2 (two) times daily. 60 tablet 0   sertraline  (ZOLOFT ) 100 MG tablet Take 1.5 tablets (150 mg total) by mouth daily. Pt takes 1.5 tablets daily (150 mg) 45 tablet 0    Labs  Lab Results:  Admission on 01/18/2024, Discharged on 01/18/2024  Component Date Value Ref Range Status   WBC 01/18/2024 6.4  4.0 - 10.5 K/uL Final   RBC 01/18/2024 5.10  3.87 - 5.11 MIL/uL Final   Hemoglobin 01/18/2024 13.2  12.0 - 15.0 g/dL Final   HCT 87/86/7974 40.1  36.0 - 46.0 % Final   MCV 01/18/2024 78.6 (L)  80.0 - 100.0 fL Final   MCH 01/18/2024 25.9 (L)  26.0 - 34.0 pg Final   MCHC 01/18/2024 32.9  30.0 - 36.0 g/dL Final   RDW 87/86/7974 15.3  11.5 - 15.5 % Final   Platelets 01/18/2024 233  150 - 400 K/uL Final   nRBC 01/18/2024 0.0  0.0 - 0.2 % Final   Neutrophils Relative % 01/18/2024 56  % Final   Neutro Abs 01/18/2024 3.6  1.7 - 7.7 K/uL Final   Lymphocytes Relative 01/18/2024 33  % Final   Lymphs Abs 01/18/2024 2.1  0.7 - 4.0 K/uL Final   Monocytes Relative 01/18/2024 9  % Final   Monocytes Absolute 01/18/2024 0.5  0.1 - 1.0 K/uL Final   Eosinophils Relative 01/18/2024 1  % Final   Eosinophils Absolute 01/18/2024 0.0  0.0 - 0.5 K/uL Final   Basophils Relative 01/18/2024 1  % Final   Basophils Absolute 01/18/2024 0.1  0.0 - 0.1 K/uL Final   Immature Granulocytes 01/18/2024 0  % Final   Abs Immature Granulocytes 01/18/2024 0.02  0.00 - 0.07 K/uL Final   Performed at [facility] Lab, [address] [place], [place] [address]   Sodium 01/18/2024 137  135 - 145 mmol/L Final   Potassium 01/18/2024 2.9 (L)  3.5 - 5.1 mmol/L Final   Chloride 01/18/2024 102  98 - 111 mmol/L Final   CO2 01/18/2024 24  22 - 32 mmol/L Final   Glucose, Bld  01/18/2024 119 (H)  70 - 99 mg/dL Final   Glucose reference range applies only to samples taken after fasting for at least 8 hours.  BUN 01/18/2024 <5 (L)  6 - 20 mg/dL Final   Creatinine, Ser 01/18/2024 0.75  0.44 - 1.00 mg/dL Final   Calcium  01/18/2024 8.7 (L)  8.9 - 10.3 mg/dL Final   Total Protein 87/86/7974 6.5  6.5 - 8.1 g/dL Final   Albumin 87/86/7974 3.7  3.5 - 5.0 g/dL Final   AST 87/86/7974 34  15 - 41 U/L Final   ALT 01/18/2024 48 (H)  0 - 44 U/L Final   Alkaline Phosphatase 01/18/2024 62  38 - 126 U/L Final   Total Bilirubin 01/18/2024 0.8  0.0 - 1.2 mg/dL Final   GFR, Estimated 01/18/2024 >60  >60 mL/min Final   Comment: (NOTE) Calculated using the CKD-EPI Creatinine Equation (2021)    Anion gap 01/18/2024 11  5 - 15 Final   Performed at [facility] Lab, [address], [place], [place] [address]   Magnesium  01/18/2024 1.7  1.7 - 2.4 mg/dL Final   Performed at [facility], [address] [place], [place] [address]   TSH 01/18/2024 1.214  0.350 - 4.500 uIU/mL Final   Comment: Performed by a 3rd Generation assay with a functional sensitivity of <=0.01 uIU/mL. Performed at [facility] Lab, [address], [place], [place] [address]    Salicylate Lvl 01/18/2024 <7.0 (L)  7.0 - 30.0 mg/dL Final   Performed at [facility] Lab, [address], [place], [place] [address]   Acetaminophen  (Tylenol ), Serum 01/18/2024 <10 (L)  10 - 30 ug/mL Final   Comment: (NOTE) Therapeutic concentrations vary significantly. A range of 10-30 ug/mL  may be an effective concentration for many patients. However, some  are best treated at concentrations outside of this range. Acetaminophen  concentrations >150 ug/mL at 4 hours after ingestion  and >50 ug/mL at 12 hours after ingestion are often associated with  toxic reactions.  Performed at [facility], [address], [place], [place] [address]   Admission on 01/17/2024, Discharged on 01/18/2024  Component Date Value Ref  Range Status   WBC 01/17/2024 8.0  4.0 - 10.5 K/uL Final   RBC 01/17/2024 5.52 (H)  3.87 - 5.11 MIL/uL Final   Hemoglobin 01/17/2024 14.3  12.0 - 15.0 g/dL Final   HCT 87/87/7974 43.3  36.0 - 46.0 % Final   MCV 01/17/2024 78.4 (L)  80.0 - 100.0 fL Final   MCH 01/17/2024 25.9 (L)  26.0 - 34.0 pg Final   MCHC 01/17/2024 33.0  30.0 - 36.0 g/dL Final   RDW 87/87/7974 15.4  11.5 - 15.5 % Final   Platelets 01/17/2024 272  150 - 400 K/uL Final   nRBC 01/17/2024 0.0  0.0 - 0.2 % Final   Neutrophils Relative % 01/17/2024 66  % Final   Neutro Abs 01/17/2024 5.3  1.7 - 7.7 K/uL Final   Lymphocytes Relative 01/17/2024 25  % Final   Lymphs Abs 01/17/2024 2.0  0.7 - 4.0 K/uL Final   Monocytes Relative 01/17/2024 7  % Final   Monocytes Absolute 01/17/2024 0.6  0.1 - 1.0 K/uL Final   Eosinophils Relative 01/17/2024 1  % Final   Eosinophils Absolute 01/17/2024 0.0  0.0 - 0.5 K/uL Final   Basophils Relative 01/17/2024 1  % Final   Basophils Absolute 01/17/2024 0.0  0.0 - 0.1 K/uL Final   Immature Granulocytes 01/17/2024 0  % Final   Abs Immature Granulocytes 01/17/2024 0.03  0.00 - 0.07 K/uL Final   Performed at [facility] Lab, [address] [place], [place] [address]   Sodium 01/17/2024 136  135 - 145 mmol/L Final   Potassium 01/17/2024 2.8 (L)  3.5 - 5.1 mmol/L Final   Chloride 01/17/2024 98  98 - 111 mmol/L Final   CO2 01/17/2024 25  22 - 32 mmol/L Final   Glucose, Bld 01/17/2024 92  70 - 99 mg/dL Final   Glucose reference range applies only to samples taken after fasting for at least 8 hours.   BUN 01/17/2024 5 (L)  6 - 20 mg/dL Final   Creatinine, Ser 01/17/2024 0.82  0.44 - 1.00 mg/dL Final   Calcium  01/17/2024 8.8 (L)  8.9 - 10.3 mg/dL Final   Total Protein 87/87/7974 7.3  6.5 - 8.1 g/dL Final   Albumin 87/87/7974 4.3  3.5 - 5.0 g/dL Final   AST 87/87/7974 41  15 - 41 U/L Final   ALT 01/17/2024 54 (H)  0 - 44 U/L Final   Alkaline Phosphatase 01/17/2024 65  38 - 126 U/L Final   Total  Bilirubin 01/17/2024 0.9  0.0 - 1.2 mg/dL Final   GFR, Estimated 01/17/2024 >60  >60 mL/min Final   Comment: (NOTE) Calculated using the CKD-EPI Creatinine Equation (2021)    Anion gap 01/17/2024 13  5 - 15 Final   Performed at [facility] Lab, [address] [place], [place] [address]   Alcohol , Ethyl (B) 01/17/2024 <15  <15 mg/dL Final   Comment: (NOTE) For medical purposes only. Performed at [facility], [address] [place], [place] [address]    POC Amphetamine UR 01/17/2024 None Detected  NONE DETECTED (Cut Off Level 1000 ng/mL) Final   POC Secobarbital (BAR) 01/17/2024 None Detected  NONE DETECTED (Cut Off Level 300 ng/mL) Final   POC Buprenorphine (BUP) 01/17/2024 None Detected  NONE DETECTED (Cut Off Level 10 ng/mL) Final   POC Oxazepam (BZO) 01/17/2024 None Detected  NONE DETECTED (Cut Off Level 300 ng/mL) Final   POC Cocaine UR 01/17/2024 None Detected  NONE DETECTED (Cut Off Level 300 ng/mL) Final   POC Methamphetamine UR 01/17/2024 None Detected  NONE DETECTED (Cut Off Level 1000 ng/mL) Final   POC Morphine  01/17/2024 None Detected  NONE DETECTED (Cut Off Level 300 ng/mL) Final   POC Methadone UR 01/17/2024 None Detected  NONE DETECTED (Cut Off Level 300 ng/mL) Final   POC Oxycodone  UR 01/17/2024 None Detected  NONE DETECTED (Cut Off Level 100 ng/mL) Final   POC Marijuana UR 01/17/2024 None Detected  NONE DETECTED (Cut Off Level 50 ng/mL) Final   Preg Test, Ur 01/17/2024 Negative  Negative Final  Admission on 10/19/2023, Discharged on 10/20/2023  Component Date Value Ref Range Status   WBC 10/19/2023 9.0  4.0 - 10.5 K/uL Final   RBC 10/19/2023 4.67  3.87 - 5.11 MIL/uL Final   Hemoglobin 10/19/2023 11.6 (L)  12.0 - 15.0 g/dL Final   HCT 90/86/7974 37.0  36.0 - 46.0 % Final   MCV 10/19/2023 79.2 (L)  80.0 - 100.0 fL Final   MCH 10/19/2023 24.8 (L)  26.0 - 34.0 pg Final   MCHC 10/19/2023 31.4  30.0 - 36.0 g/dL Final   RDW 90/86/7974 13.5  11.5 - 15.5 % Final    Platelets 10/19/2023 278  150 - 400 K/uL Final   nRBC 10/19/2023 0.0  0.0 - 0.2 % Final   Performed at [facility], [address], [place], [place] [address]   Sodium 10/19/2023 138  135 - 145 mmol/L  Final   Potassium 10/19/2023 3.3 (L)  3.5 - 5.1 mmol/L Final   Chloride 10/19/2023 104  98 - 111 mmol/L Final   CO2 10/19/2023 23  22 - 32 mmol/L Final   Glucose, Bld 10/19/2023 99  70 - 99 mg/dL Final   Glucose reference range applies only to samples taken after fasting for at least 8 hours.   BUN 10/19/2023 5 (L)  6 - 20 mg/dL Final   Creatinine, Ser 10/19/2023 0.76  0.44 - 1.00 mg/dL Final   Calcium  10/19/2023 9.2  8.9 - 10.3 mg/dL Final   Total Protein 90/86/7974 7.1  6.5 - 8.1 g/dL Final   Albumin 90/86/7974 3.7  3.5 - 5.0 g/dL Final   AST 90/86/7974 17  15 - 41 U/L Final   ALT 10/19/2023 18  0 - 44 U/L Final   Alkaline Phosphatase 10/19/2023 86  38 - 126 U/L Final   Total Bilirubin 10/19/2023 0.6  0.0 - 1.2 mg/dL Final   GFR, Estimated 10/19/2023 >60  >60 mL/min Final   Comment: (NOTE) Calculated using the CKD-EPI Creatinine Equation (2021)    Anion gap 10/19/2023 11  5 - 15 Final   Performed at [facility], [address] [place], [place] [address]   Opiates 10/19/2023 NONE DETECTED  NONE DETECTED Final   Cocaine 10/19/2023 NONE DETECTED  NONE DETECTED Final   Benzodiazepines 10/19/2023 NONE DETECTED  NONE DETECTED Final   Amphetamines 10/19/2023 NONE DETECTED  NONE DETECTED Final   Tetrahydrocannabinol 10/19/2023 NONE DETECTED  NONE DETECTED Final   Barbiturates 10/19/2023 NONE DETECTED  NONE DETECTED Final   Comment: (NOTE) DRUG SCREEN FOR MEDICAL PURPOSES ONLY.  IF CONFIRMATION IS NEEDED FOR ANY PURPOSE, NOTIFY LAB WITHIN 5 DAYS.  LOWEST DETECTABLE LIMITS FOR URINE DRUG SCREEN Drug Class                     Cutoff (ng/mL) Amphetamine and metabolites    1000 Barbiturate and metabolites    200 Benzodiazepine                 200 Opiates and metabolites         300 Cocaine and metabolites        300 THC                            50 Performed at [facility] Lab, [address], [place], [place] [address]    Preg Test, Ur 10/19/2023 NEGATIVE  NEGATIVE Final   Comment:        THE SENSITIVITY OF THIS METHODOLOGY IS >20 mIU/mL. Performed at [facility], [address], [place], [place] [address]    Alcohol , Ethyl (B) 10/19/2023 <15  <15 mg/dL Final   Comment: (NOTE) For medical purposes only. Performed at [facility], [address], [place], [place] [address]    Acetaminophen  (Tylenol ), Serum 10/19/2023 <10 (L)  10 - 30 ug/mL Final   Comment: (NOTE) Therapeutic concentrations vary significantly. A range of 10-30 ug/mL  may be an effective concentration for many patients. However, some  are best treated at concentrations outside of this range. Acetaminophen  concentrations >150 ug/mL at 4 hours after ingestion  and >50 ug/mL at 12 hours after ingestion are often associated with  toxic reactions.  Performed at [facility], [address] [place], [place] [address]    Salicylate Lvl 10/19/2023 <7.0 (L)  7.0 - 30.0 mg/dL Final   Performed at [facility], [address], [place], [place] [address]  Admission on 09/25/2023, Discharged on 09/28/2023  Component Date Value Ref Range Status   POC Amphetamine UR 09/25/2023 None Detected  NONE DETECTED (Cut Off Level 1000 ng/mL) Final   POC Secobarbital (BAR) 09/25/2023 None Detected  NONE DETECTED (Cut Off Level 300 ng/mL) Final   POC Buprenorphine (BUP) 09/25/2023 None Detected  NONE DETECTED (Cut Off Level 10 ng/mL) Final   POC Oxazepam (BZO) 09/25/2023 None Detected  NONE DETECTED (Cut Off Level 300 ng/mL) Final   POC Cocaine UR 09/25/2023 None Detected  NONE DETECTED (Cut Off Level 300 ng/mL) Final   POC Methamphetamine UR 09/25/2023 None Detected  NONE DETECTED (Cut Off Level 1000 ng/mL) Final   POC Morphine  09/25/2023 None Detected  NONE DETECTED (Cut Off  Level 300 ng/mL) Final   POC Methadone UR 09/25/2023 None Detected  NONE DETECTED (Cut Off Level 300 ng/mL) Final   POC Oxycodone  UR 09/25/2023 None Detected  NONE DETECTED (Cut Off Level 100 ng/mL) Final   POC Marijuana UR 09/25/2023 None Detected  NONE DETECTED (Cut Off Level 50 ng/mL) Final   Preg Test, Ur 09/25/2023 Negative  Negative Final  Admission on 07/31/2023, Discharged on 07/31/2023  Component Date Value Ref Range Status   WBC 07/31/2023 9.1  4.0 - 10.5 K/uL Final   RBC 07/31/2023 4.46  3.87 - 5.11 MIL/uL Final   Hemoglobin 07/31/2023 11.5 (L)  12.0 - 15.0 g/dL Final   HCT 93/74/7974 36.0  36.0 - 46.0 % Final   MCV 07/31/2023 80.7  80.0 - 100.0 fL Final   MCH 07/31/2023 25.8 (L)  26.0 - 34.0 pg Final   MCHC 07/31/2023 31.9  30.0 - 36.0 g/dL Final   RDW 93/74/7974 14.8  11.5 - 15.5 % Final   Platelets 07/31/2023 250  150 - 400 K/uL Final   nRBC 07/31/2023 0.0  0.0 - 0.2 % Final   Neutrophils Relative % 07/31/2023 67  % Final   Neutro Abs 07/31/2023 6.1  1.7 - 7.7 K/uL Final   Lymphocytes Relative 07/31/2023 25  % Final   Lymphs Abs 07/31/2023 2.3  0.7 - 4.0 K/uL Final   Monocytes Relative 07/31/2023 7  % Final   Monocytes Absolute 07/31/2023 0.6  0.1 - 1.0 K/uL Final   Eosinophils Relative 07/31/2023 1  % Final   Eosinophils Absolute 07/31/2023 0.1  0.0 - 0.5 K/uL Final   Basophils Relative 07/31/2023 0  % Final   Basophils Absolute 07/31/2023 0.0  0.0 - 0.1 K/uL Final   Immature Granulocytes 07/31/2023 0  % Final   Abs Immature Granulocytes 07/31/2023 0.02  0.00 - 0.07 K/uL Final   Performed at [facility], [address], [place], [place] [address]   Sodium 07/31/2023 138  135 - 145 mmol/L Final   Potassium 07/31/2023 3.8  3.5 - 5.1 mmol/L Final   Chloride 07/31/2023 105  98 - 111 mmol/L Final   CO2 07/31/2023 23  22 - 32 mmol/L Final   Glucose, Bld 07/31/2023 92  70 - 99 mg/dL Final   Glucose reference range applies only to samples taken after fasting for at  least 8 hours.   BUN 07/31/2023 12  6 - 20 mg/dL Final   Creatinine, Ser 07/31/2023 0.82  0.44 - 1.00 mg/dL Final   Calcium  07/31/2023 9.4  8.9 - 10.3 mg/dL Final   Total Protein 93/74/7974 6.8  6.5 -  8.1 g/dL Final   Albumin 93/74/7974 4.1  3.5 - 5.0 g/dL Final   AST 93/74/7974 17  15 - 41 U/L Final   ALT 07/31/2023 20  0 - 44 U/L Final   Alkaline Phosphatase 07/31/2023 58  38 - 126 U/L Final   Total Bilirubin 07/31/2023 0.5  0.0 - 1.2 mg/dL Final   GFR, Estimated 07/31/2023 >60  >60 mL/min Final   Comment: (NOTE) Calculated using the CKD-EPI Creatinine Equation (2021)    Anion gap 07/31/2023 10  5 - 15 Final   Performed at [facility] Lab, [address] [place], [place] [address]   Preg Test, Ur 07/31/2023 Negative  Negative Final   POC Amphetamine UR 07/31/2023 None Detected  NONE DETECTED (Cut Off Level 1000 ng/mL) Final   POC Secobarbital (BAR) 07/31/2023 None Detected  NONE DETECTED (Cut Off Level 300 ng/mL) Final   POC Buprenorphine (BUP) 07/31/2023 None Detected  NONE DETECTED (Cut Off Level 10 ng/mL) Final   POC Oxazepam (BZO) 07/31/2023 None Detected  NONE DETECTED (Cut Off Level 300 ng/mL) Final   POC Cocaine UR 07/31/2023 None Detected  NONE DETECTED (Cut Off Level 300 ng/mL) Final   POC Methamphetamine UR 07/31/2023 None Detected  NONE DETECTED (Cut Off Level 1000 ng/mL) Final   POC Morphine  07/31/2023 None Detected  NONE DETECTED (Cut Off Level 300 ng/mL) Final   POC Methadone UR 07/31/2023 None Detected  NONE DETECTED (Cut Off Level 300 ng/mL) Final   POC Oxycodone  UR 07/31/2023 None Detected  NONE DETECTED (Cut Off Level 100 ng/mL) Final   POC Marijuana UR 07/31/2023 None Detected  NONE DETECTED (Cut Off Level 50 ng/mL) Final   SARSCOV2ONAVIRUS 2 AG 07/31/2023 NEGATIVE  NEGATIVE Final   Comment: (NOTE) SARS-CoV-2 antigen NOT DETECTED.   Negative results are presumptive.  Negative results do not preclude SARS-CoV-2 infection and should not be used as the sole basis  for treatment or other patient management decisions, including infection  control decisions, particularly in the presence of clinical signs and  symptoms consistent with COVID-19, or in those who have been in contact with the virus.  Negative results must be combined with clinical observations, patient history, and epidemiological information. The expected result is Negative.  Fact Sheet for Patients: https://www.jennings-kim.com/  Fact Sheet for Healthcare Providers: https://alexander-rogers.biz/  This test is not yet approved or cleared by the [place]  FDA and  has been authorized for detection and/or diagnosis of SARS-CoV-2 by FDA under an Emergency Use Authorization (EUA).  This EUA will remain in effect (meaning this test can be used) for the duration of  the COV                          ID-19 declaration under Section 564(b)(1) of the Act, 21 U.S.C. section 360bbb-3(b)(1), unless the authorization is terminated or revoked sooner.      Blood Alcohol  level:  Lab Results  Component Value Date   [facility] <15 01/17/2024   ETH <15 10/19/2023    Metabolic Disorder Labs: Lab Results  Component Value Date   HGBA1C 5.4 10/22/2022   MPG 108 10/22/2022   MPG 103 04/20/2022   Lab Results  Component Value Date   PROLACTIN 20.0 12/14/2022   PROLACTIN 66.2 (H) 03/02/2021   Lab Results  Component Value Date   CHOL 188 12/14/2022   TRIG 222 (H) 12/14/2022   HDL 41 12/14/2022   CHOLHDL 4.6 12/14/2022   VLDL 44 (H)  12/14/2022   LDLCALC 103 (H) 12/14/2022   LDLCALC 117 (H) 10/22/2022    Therapeutic Lab Levels: No results found for: LITHIUM  Lab Results  Component Value Date   VALPROATE 48 (L) 07/10/2015   VALPROATE 84 05/31/2015   No results found for: CBMZ  Physical Findings   AIMS    Flowsheet Row [facility]) [facility] (Discharged) [facility] Ad[facility] (Discharged) [facility] Admission (Discharged) from [facility] Admission (Discharged) [facility]  AIMS Total Score 0 0 0 0 0   AUDIT    Flowsheet Row ED to Hosp-Admission (Discharged) [facility] to Hosp-Admission (Discharged) from [facility] Ad[facility] (Discharged) from [facility] Admission (Discharged) from [facility] Admission (Discharged) from [facility]  Alcohol  Use Disorder Identification Test Final Score (AUDIT) 0 0 0 0 0   GAD-7    Flowsheet Row Office Visit from 08/20/2016 in Center for [facility] from 04/03/2016 in [facility] for [facility] Routine Prenatal from 03/14/2016 in [facility] for [facility] Routine Prenatal from 03/08/2016 in Center for [facility] Routine Prenatal from 03/01/2016 in [facility]  Total GAD-7 Score 1 19 13 9 5    PHQ2-9    Flowsheet Row ED from 03/10/2022 in [facility] ED from 02/10/2022 in [facility] ED from 06/26/2021 in [facility] Emergency Department at [facility] ED from 05/01/2021 in [facility] ED from 02/03/2020 in [name]  PHQ-2 Total Score 2 4 3 5 6   PHQ-9 Total Score 9 16 9 17 27    Flowsheet Row ED from 01/18/2024 in [facility] Most recent reading at 01/20/2024 11:21 AM ED from 01/18/2024 in [facility] Emergency Department at [facility] Most recent reading at 01/18/2024  3:00 PM ED from 10/19/2023 in [facility] Emergency Department at [facility] Most recent reading at 10/19/2023  8:22 PM  C-SSRS RISK CATEGORY No Risk No Risk Low Risk     Musculoskeletal  Strength & Muscle Tone: within normal limits Gait & Station: normal Patient leans: N/A  Psychiatric Specialty Exam  Mental Status Exam:  Appearance: Patient is a pale caucasian female who appears approximately her stated age. She is laying in bed in hospital scrubs with her arms or a blanket over her head  Behavior: Uncooperative, minimal eye contact  Attitude: hostile,  Speech: Sparse, so quiet it is difficult to hear.  Mood:  I don't want to talk about it  Affect: angry, dysphoric, flattened affect  Thought Process: linear  Thought Content: patient declined to answer questions   SI/HI: Patient endorses SI without a plan. Will not answer questions about HI  Perceptions: denies hallucinations, not seen responding  Judgment: Lacking  Insight: Shallow  Fund of Knowledge: WNL     No data recorded  Physical Exam  Physical Exam Vitals and nursing note reviewed.  Eyes:     Pupils: Pupils are equal, round, and reactive to light.  Musculoskeletal:        General:  Normal range of motion.     Cervical back: Normal range of motion.    Review of Systems  Psychiatric/Behavioral:  Positive for depression, hallucinations and suicidal ideas. Negative for memory loss and substance abuse. The patient is nervous/anxious and has insomnia.   All other systems reviewed and are negative.  Blood pressure 104/63, pulse 82, temperature 98.4 F (36.9 C), temperature source Oral, resp. rate 16, weight 178 lb (80.7 kg), SpO2 100%. Body mass index is 29.62 kg/m.  Treatment Plan Summary: Current Medications: Daily contact with patient to assess and evaluate symptoms and progress in treatment and Medication management  benztropine   0.5 mg Oral BID    gabapentin   300 mg Oral Q1500   gabapentin   600 mg Oral BID   pantoprazole   40 mg Oral Daily   potassium chloride   40 mEq Oral Daily   prazosin   2 mg Oral QHS   risperiDONE   3 mg Oral BID   sertraline   150 mg Oral Daily   PRNS  alum & mag hydroxide-simeth, hydrOXYzine , magnesium  hydroxide, melatonin, OLANZapine , OLANZapine , OLANZapine  zydis, traZODone    Lynwood Morene Lavone Delsie, MD 01/20/2024 3:57 PM
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
## 2024-01-20 NOTE — ED Notes (Signed)
 Patient did not eat dinner and appeared irritable this evening. Patient currently resting in recliner with no s/s of current distress.

## 2024-01-21 MED ORDER — HYDROXYZINE HCL 25 MG PO TABS: 25.0000 mg | ORAL_TABLET | Freq: Three times a day (TID) | ORAL | Status: AC | PRN

## 2024-01-21 MED ORDER — SERTRALINE HCL 50 MG PO TABS: 150.0000 mg | ORAL_TABLET | Freq: Every day | ORAL | Status: AC

## 2024-01-21 MED ORDER — OLANZAPINE 5 MG PO TBDP: 5.0000 mg | ORAL_TABLET | Freq: Three times a day (TID) | ORAL | Status: AC | PRN

## 2024-01-21 NOTE — ED Notes (Signed)
 Pt remains angry and agitated, PRN's offered.  Pt refused and stated leave her alone.

## 2024-01-21 NOTE — ED Notes (Signed)
 Report given to tiffany,rn. Pt picked up by safe transport. Pt irritable, staff called security to help walk pt out as pt was agitated. Pt refused to wear non slip socks. Pt refused to wear coat while walking out. Pt non compliant with staff. Pt was able to sit in [place] and transported.

## 2024-01-21 NOTE — ED Notes (Signed)
Pt refused AM vital signs. 

## 2024-01-21 NOTE — ED Notes (Addendum)
 Pt is severely agitated on unit. She is yelling and frequently cursing. No Interaction with others to trigger these behaviors. She is currently inside the handicap bathroom continuing these behaviors. When MHT asked if there is anything we can do or get her, she responds with  leave me alone. Will continue to monitor for safety.

## 2024-01-21 NOTE — Care Plan (Signed)
 Admission date & time: 01/21/2024  7:41 PM Legal status: Involuntary Presenting symptoms: Patient refused to cooperate with admission. When asked if preferred to be called [name] or [name], pt yelled at speaker, I'm not talking to you. Patient refused to sign PHI for friend/family. Put head on table and refused further interaction. Educated of need for vital signs and skin assessment before patient able to be escorted to [facility] and room and comfort of bed. After pt explained procedure, calmed enough for vital signs and skin assessment. Patient continued to remain non-verbal throughout interaction. Also cooperative for name and beacon band placement. Escorted to [employer]. Attempts to orient to unit and area of medications or food, I'm not going to take any medications or eat anything! Spoken loudly and emphatically. Patient then went to room and lied down briefly. Later left room, while sitting in dayroom asked BHT about food and talked of concern of being pregnant. Pt insist of being couple weeks pregnant. Pt returned to room after completion of snack. Admitting diagnosis: schizoaffective disorder Observations: Routine (q78min.) Precautions: self harm Medical conditions - see nursing admission assessment.  Admission skin assessment findings: Patient with intact skin. Old well healed scars to right and left forearms. Old well healed small scar to rt anterior knee and to lt shin, below knee.  Nursing admission assessment complete: yes-Limited, pt refused to answer questions. Information obtained from chart. Report given. Shift Summary Behavioral plan was reviewed and a calm environment was promoted to support safety.  No new injuries or illnesses were documented, and patient remained independent in grooming.  Patient refused most interventions and declined to answer questions related to coping and psychological trauma.  Group attendance and interaction with staff and another patient were noted, despite  refusal of brief intervention.  Patient exhibited angry, defiant, and uncooperative behaviors, but no unsafe or destructive actions were observed.   Adheres to Safety Considerations for Self and Others: No new cuts or wounds were noted, and no unsafe or destructive behaviors were observed; however, patient refused to answer most safety-related questions and exhibited angry, defiant, and uncooperative behaviors throughout the shift. Behavioral plan was reviewed and a calm environment was promoted.   Absence of New-Onset Illness or Injury: No new injuries or illnesses were documented, and patient remained independent in grooming and did not require assistive devices.   Optimized Coping Skills in Response to Life Stressors: Patient displayed flat affect, angry and irritable mood, poor eye contact, and refused to answer most questions related to coping and psychological trauma.   Develops/Participates in Therapeutic Alliance to Support Successful Transition: Patient refused brief intervention and declined to answer most questions, but attended group and interacted with staff and another patient.

## 2024-01-21 NOTE — Progress Notes (Signed)
 Pt has been accepted to [facility] . Bed assignment:Main   Pt meets inpatient criteria per Lynwood Morene Bash, MD   Attending Physician will be Dr. Elspeth Gory  Report can be called to: - [phone]  Pt can arrive ASAP  Care Team Notified: Comfort Reinhold, RN, Lynwood Morene Bash, MD

## 2024-01-21 NOTE — Group Note (Signed)
 GROUP NOTE  Number of Participants: 12 Group Date: 01/21/2024 Start Time: 2000 End Time: 2030 Facilitators: Erika Nest S   TYPE OF THERAPY: Wrap Up Group   PURPOSE: To communicate progress in treatment plan, group attendance, and to ask about the patient's day   INTERVENTION: Individually discuss completion of goals with patients in a group setting   PATIENT RESPONSE:    Goal chosen: Pt is new to the unit but was informed how groups and choosing a goal works, pt was encouraged to attend group and choose a goal in the morning.   Completed?: No   How was your day?: Pt wasn't on the unit during group.   Highlight of your day?: Pt wasn't on the unit during group.     PLAN: Continue to attend and participate in groups, make progress on treatment plan   Patient Primary Diagnosis: No Principal Problem: There is no principal problem currently on the Problem List. Please update the Problem List and refresh.

## 2024-01-21 NOTE — ED Notes (Signed)
 Patient alert, Mood appears calm. Respirations even and unlabored. No acute distress observed.  Resting in recliner asleep. Environment secured. Poc ongoing

## 2024-01-21 NOTE — ED Notes (Signed)
Pt refused lunch

## 2024-01-21 NOTE — Progress Notes (Signed)
 Inpatient Psychiatric Referral  Patient was recommended inpatient per Lynwood Morene Bash, MD. There are no available beds at [facility], per [facility]. Patient was referred to the following out of network facilities:  Destination  Service Provider Address Phone Fax  [facility]  [address] [place] [place] [address] [phone]  [facility]  [address] [place] [address] [phone]  [facility]  [address] [place], [place] [address] [phone]  [facility] Patient Placement  [facility], [place] [phone]  [facility]  [address] [place] [address] [phone]  [facility]  [address] [place] [address] [phone]  [facility] EFAX  [address] [place], [place] [phone]  [facility]  [address] [place] [address] [phone]  [facility]  [address], [place] [address] [phone]  [facility]  [address] [place], [place] [address] [phone]  [facility]  [address] [place] [address] [phone]  [facility]  [address] [place] [address] [phone]  [facility]  [address] [place] [address] [phone]  [facility]  [address] [place] [address] [phone]  [facility]  [address] [place] [address] [phone]  [facility]  [address], [place] [address] [phone]  [facility]  [address] [place] [address] [phone]  [facility]  [address] [place] [address] [phone]    Situation ongoing, CSW to continue following and update chart as more information becomes available.   Harrie Sofia MSW, [place] 01/21/2024

## 2024-01-21 NOTE — ED Notes (Signed)
Pt awake & resting at present, no distress noted.  Monitoring for safety. 
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
## 2024-01-21 NOTE — Discharge Instructions (Addendum)
 To [facility]

## 2024-01-21 NOTE — ED Notes (Signed)
 Pt adamantly refuses all engagement with staff despite multiple attempts at interaction. Pt persistently declines all offered medications, Dr. Delsie notified. Pt observed sleeping in recliner with eyes firmly closed, and demonstrating no response to verbal cues or redirection. Respirations even and unlabored.
